# Patient Record
Sex: Female | Born: 2000
Health system: Southern US, Community
[De-identification: ages and names within clinical notes are randomized; demographics above are authoritative.]

## PROBLEM LIST (undated history)

## (undated) ENCOUNTER — Emergency Department (HOSPITAL_BASED_OUTPATIENT_CLINIC_OR_DEPARTMENT_OTHER): Admission: EM | Payer: BC Managed Care – PPO | Source: Home / Self Care

## (undated) ENCOUNTER — Encounter

## (undated) ENCOUNTER — Encounter: Attending: Pediatric Hematology-Oncology | Primary: Pediatric Hematology-Oncology

## (undated) ENCOUNTER — Ambulatory Visit

## (undated) ENCOUNTER — Telehealth

## (undated) ENCOUNTER — Encounter: Attending: Registered" | Primary: Registered"

## (undated) ENCOUNTER — Ambulatory Visit: Payer: PRIVATE HEALTH INSURANCE

## (undated) ENCOUNTER — Encounter
Attending: Student in an Organized Health Care Education/Training Program | Primary: Student in an Organized Health Care Education/Training Program

## (undated) ENCOUNTER — Ambulatory Visit: Payer: PRIVATE HEALTH INSURANCE | Attending: Registered" | Primary: Registered"

## (undated) ENCOUNTER — Ambulatory Visit
Payer: PRIVATE HEALTH INSURANCE | Attending: Student in an Organized Health Care Education/Training Program | Primary: Student in an Organized Health Care Education/Training Program

## (undated) ENCOUNTER — Ambulatory Visit: Payer: BLUE CROSS/BLUE SHIELD

## (undated) ENCOUNTER — Telehealth: Attending: Pediatric Hematology-Oncology | Primary: Pediatric Hematology-Oncology

## (undated) ENCOUNTER — Encounter: Payer: PRIVATE HEALTH INSURANCE | Attending: Gastroenterology | Primary: Gastroenterology

## (undated) ENCOUNTER — Telehealth: Attending: Nephrology | Primary: Nephrology

## (undated) ENCOUNTER — Encounter: Attending: Gastroenterology | Primary: Gastroenterology

## (undated) ENCOUNTER — Encounter: Attending: Surgery | Primary: Surgery

## (undated) ENCOUNTER — Encounter: Attending: Clinical | Primary: Clinical

## (undated) ENCOUNTER — Encounter: Payer: PRIVATE HEALTH INSURANCE | Attending: Pediatric Hematology-Oncology | Primary: Pediatric Hematology-Oncology

## (undated) ENCOUNTER — Other Ambulatory Visit

## (undated) ENCOUNTER — Telehealth: Attending: Clinical | Primary: Clinical

## (undated) ENCOUNTER — Ambulatory Visit: Payer: PRIVATE HEALTH INSURANCE | Attending: Clinical | Primary: Clinical

## (undated) ENCOUNTER — Telehealth: Attending: Hematology & Oncology | Primary: Hematology & Oncology

## (undated) ENCOUNTER — Telehealth: Attending: Pediatrics | Primary: Pediatrics

## (undated) ENCOUNTER — Encounter: Attending: Diagnostic Radiology | Primary: Diagnostic Radiology

## (undated) ENCOUNTER — Ambulatory Visit
Payer: BLUE CROSS/BLUE SHIELD | Attending: Student in an Organized Health Care Education/Training Program | Primary: Student in an Organized Health Care Education/Training Program

## (undated) ENCOUNTER — Ambulatory Visit: Payer: BLUE CROSS/BLUE SHIELD | Attending: Clinical | Primary: Clinical

## (undated) ENCOUNTER — Telehealth: Attending: Family | Primary: Family

## (undated) ENCOUNTER — Encounter: Attending: "Endocrinology | Primary: "Endocrinology

## (undated) ENCOUNTER — Inpatient Hospital Stay

## (undated) ENCOUNTER — Ambulatory Visit: Payer: BLUE CROSS/BLUE SHIELD | Attending: Gastroenterology | Primary: Gastroenterology

## (undated) ENCOUNTER — Ambulatory Visit: Payer: PRIVATE HEALTH INSURANCE | Attending: Gastroenterology | Primary: Gastroenterology

## (undated) ENCOUNTER — Encounter: Attending: Family | Primary: Family

## (undated) ENCOUNTER — Ambulatory Visit: Payer: PRIVATE HEALTH INSURANCE | Attending: Surgery | Primary: Surgery

## (undated) ENCOUNTER — Encounter
Payer: PRIVATE HEALTH INSURANCE | Attending: Plastic and Reconstructive Surgery | Primary: Plastic and Reconstructive Surgery

## (undated) ENCOUNTER — Encounter: Attending: Anesthesiology | Primary: Anesthesiology

## (undated) ENCOUNTER — Non-Acute Institutional Stay: Payer: PRIVATE HEALTH INSURANCE | Attending: Pediatric Hematology-Oncology | Primary: Pediatric Hematology-Oncology

## (undated) ENCOUNTER — Inpatient Hospital Stay: Payer: Medicaid (Managed Care)

## (undated) ENCOUNTER — Encounter: Attending: Internal Medicine | Primary: Internal Medicine

## (undated) ENCOUNTER — Encounter: Payer: PRIVATE HEALTH INSURANCE | Attending: Surgery | Primary: Surgery

## (undated) ENCOUNTER — Ambulatory Visit: Payer: BLUE CROSS/BLUE SHIELD | Attending: Registered" | Primary: Registered"

## (undated) ENCOUNTER — Telehealth
Attending: Student in an Organized Health Care Education/Training Program | Primary: Student in an Organized Health Care Education/Training Program

## (undated) ENCOUNTER — Ambulatory Visit
Attending: Student in an Organized Health Care Education/Training Program | Primary: Student in an Organized Health Care Education/Training Program

## (undated) ENCOUNTER — Encounter: Attending: Plastic and Reconstructive Surgery | Primary: Plastic and Reconstructive Surgery

## (undated) ENCOUNTER — Encounter: Payer: PRIVATE HEALTH INSURANCE | Attending: Hematology & Oncology | Primary: Hematology & Oncology

## (undated) ENCOUNTER — Encounter: Attending: MS" | Primary: MS"

## (undated) ENCOUNTER — Telehealth: Attending: "Endocrinology | Primary: "Endocrinology

## (undated) ENCOUNTER — Ambulatory Visit: Payer: BLUE CROSS/BLUE SHIELD | Attending: Hematology | Primary: Hematology

## (undated) ENCOUNTER — Inpatient Hospital Stay: Payer: BLUE CROSS/BLUE SHIELD

## (undated) ENCOUNTER — Ambulatory Visit
Payer: PRIVATE HEALTH INSURANCE | Attending: Rehabilitative and Restorative Service Providers" | Primary: Rehabilitative and Restorative Service Providers"

## (undated) ENCOUNTER — Telehealth: Attending: Surgery | Primary: Surgery

## (undated) ENCOUNTER — Telehealth: Attending: Gastroenterology | Primary: Gastroenterology

## (undated) ENCOUNTER — Telehealth: Attending: Psychiatry | Primary: Psychiatry

## (undated) ENCOUNTER — Ambulatory Visit: Payer: PRIVATE HEALTH INSURANCE | Attending: Hematology & Oncology | Primary: Hematology & Oncology

## (undated) ENCOUNTER — Ambulatory Visit: Payer: BLUE CROSS/BLUE SHIELD | Attending: Acute Care | Primary: Acute Care

## (undated) ENCOUNTER — Ambulatory Visit: Payer: PRIVATE HEALTH INSURANCE | Attending: Oncology | Primary: Oncology

## (undated) ENCOUNTER — Telehealth: Attending: Family Medicine | Primary: Family Medicine

## (undated) ENCOUNTER — Ambulatory Visit: Payer: BLUE CROSS/BLUE SHIELD | Attending: Psychiatric/Mental Health | Primary: Psychiatric/Mental Health

## (undated) ENCOUNTER — Ambulatory Visit: Payer: PRIVATE HEALTH INSURANCE | Attending: Pediatric Hematology-Oncology | Primary: Pediatric Hematology-Oncology

## (undated) ENCOUNTER — Encounter: Attending: Psychiatric/Mental Health | Primary: Psychiatric/Mental Health

## (undated) DIAGNOSIS — D649 Anemia, unspecified: Secondary | ICD-10-CM

## (undated) DIAGNOSIS — D48119 Desmoid tumor of unspecified site: Secondary | ICD-10-CM

## (undated) DIAGNOSIS — D481 Neoplasm of uncertain behavior of connective and other soft tissue: Secondary | ICD-10-CM

## (undated) DIAGNOSIS — Z9889 Other specified postprocedural states: Secondary | ICD-10-CM

## (undated) DIAGNOSIS — K567 Ileus, unspecified: Secondary | ICD-10-CM

## (undated) DIAGNOSIS — K219 Gastro-esophageal reflux disease without esophagitis: Secondary | ICD-10-CM

## (undated) DIAGNOSIS — K635 Polyp of colon: Secondary | ICD-10-CM

## (undated) DIAGNOSIS — Q8789 Other specified congenital malformation syndromes, not elsewhere classified: Secondary | ICD-10-CM

## (undated) HISTORY — DX: Polyp of colon: K63.5

## (undated) HISTORY — DX: Other specified postprocedural states: Z98.890

## (undated) HISTORY — DX: Gastro-esophageal reflux disease without esophagitis: K21.9

## (undated) HISTORY — PX: COLOPROCTECTOMY W/ ILEO J POUCH: SUR277

## (undated) HISTORY — DX: Other specified congenital malformation syndromes, not elsewhere classified: Q87.89

## (undated) HISTORY — PX: OTHER SURGICAL HISTORY: SHX169

## (undated) HISTORY — PX: APPENDECTOMY: SHX54

## (undated) MED ORDER — MULTIVITAMIN ORAL: Freq: Every day | SUBCUTANEOUS | 0 days

## (undated) MED ORDER — SULINDAC 150 MG TABLET: Freq: Two times a day (BID) | ORAL | 0.00000 days

## (undated) MED ORDER — VITAMIN D2 ORAL: ORAL | 0 days

## (undated) MED ORDER — SIMETHICONE 125 MG CHEWABLE TABLET: Freq: Four times a day (QID) | ORAL | 0 days | PRN

## (undated) MED ORDER — FERROUS SULFATE 325 MG (65 MG IRON) TABLET: Freq: Every day | ORAL | 0 days | Status: SS

## (undated) MED ORDER — MULTIVITAMIN CAPSULE: Freq: Every day | ORAL | 0.00000 days

## (undated) MED ORDER — SULINDAC 200 MG TABLET
Freq: Two times a day (BID) | ORAL | 0 days
Start: ? — End: 2020-02-21

## (undated) MED ORDER — LOPERAMIDE 2 MG TABLET: Freq: Three times a day (TID) | ORAL | 0.00000 days | PRN

---
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
## 2019-01-03 NOTE — Unmapped (Signed)
Hi,    I haven't see eyelash loss before, but have seen overall hair loss. Seems like it can spontaneously improve. Sometimes I start people on a topical calcineurin inhibitor like protopic to see if it helps.    What are the other skin issues she is having? I've cared for a some with the hand foot skin reaction. Combination of keratolytics and topical steroids.     Happy to see if needed.    Thanks,  [employer]

## 2019-01-05 DIAGNOSIS — T50905A Adverse effect of unspecified drugs, medicaments and biological substances, initial encounter: Principal | ICD-10-CM

## 2019-01-05 DIAGNOSIS — L658 Other specified nonscarring hair loss: Principal | ICD-10-CM

## 2019-01-05 DIAGNOSIS — Q8789 Other specified congenital malformation syndromes, not elsewhere classified: Principal | ICD-10-CM

## 2019-01-07 DIAGNOSIS — D126 Benign neoplasm of colon, unspecified: Secondary | ICD-10-CM | POA: Diagnosis not present

## 2019-01-10 NOTE — Unmapped (Signed)
Some blood in urine wo complete UA. ?menses, uti

## 2019-01-11 DIAGNOSIS — D481 Neoplasm of uncertain behavior of connective and other soft tissue: Principal | ICD-10-CM

## 2019-01-11 DIAGNOSIS — Q8789 Other specified congenital malformation syndromes, not elsewhere classified: Principal | ICD-10-CM

## 2019-01-21 NOTE — Unmapped (Signed)
Called for covid screening but pt present in dermatology clinic today, already screened

## 2019-01-21 NOTE — Unmapped (Signed)
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 Selena Batten of [facility] 08/2018 and steroid injections to arm last month), epidermal cysts, and GI polyps,??last seen by [name] 10/2018 and??here for recheck post starting sorafenib. This was started 12/2018 200mg  a day with clear shrinkage of one visible lesion  but K developed nausea, dizzyness, dehydration likely due to med, seen in local ER (she is in college in [place] so that we were timing the start of med so that I could see her back ~ a month after starting when she came home for break). Doing better after iv fluids x2 though not yet back on med. Stikll a bit dizzy and fained end of last wk.. Also has had considerable hair and eyebrow loss which has been described though rarely so and saw derm today who felt that hair loss more common than our lit suggests, and gave a number of creams. Of note, had been nauseated on sulindac. See my last note for more detailed summary.    No longer on sulindac . nl US thyroid and f/u endo per recs. She was seen ??by Dr.Sandler adult GI 02/2018??and scheduled for colonoscopy again 02/25/2019.????She has had multiple mass excisions most recently by Dr. Lucretia Roers of plastic??surgery??02/2017 with inclusion cysts and osteomas,no malignancy;??jaw pits, which are being followed by dental, several removed over past yr, some delayed dentition--no canines. Wearing ??invisilon no other hardward . Has had thyroid nodules with low tsh, low free T4 last tested a year ago being follwed by endo, Dr. Timmothy Euler??though with [facility] recs of annual exam no [name]. Last scans 10/2018.     Feels that right tibial lesion softer and smaller while on sorafenib as were back neck and ant chest leisons now a bit bigger to her self exam with pain in back over old scar. PMH+ multiple new desmoids; numbness of both arms intermittently; recent right arm sprain  with fall and in brace for another 2 wks. Light but constant periods for 3 wks. No gi bleeding any longer but anorectic, ongoing nausea. Has boyfriend. Denies sex. Going back to college in [place] end dec.  ??  Other issues ankles, knees hurt??w??good response of wrist to intralesional steroids no longer an issue/  ??  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 Very stressed with abdominal pain  ??  REVIEW OF SYSTEMS: Times 12 is otherwise unchanged.   ??  FAMILY HISTORY AND SOCIAL HISTORY: Otherwise unchanged.No smoking drinking sexual activity drugs??though has boyfriend. Discussed with mom in room. Both parents negative for Gardners mutation.  ??  PHYSICAL EXAMINATION:??last Wt 49.2 kg (108 lb 8 oz)  which already was dec BP 116/72  - Pulse 89  - Temp 36.5 ??C (97.7 ??F) (Oral)  - Resp 20  - Ht 166.3 cm (5' 5.47)  - Wt 49.8 kg (109 lb 11.2 oz)  - SpO2 100%  - BMI 17.99 kg/m??   Looks well think but not bad    GENERAL:??hair looks normal and the pics family broght with them on cell phone show small clumps of hair.   SKIN: Shows some nevi, one on the right lower back no change from last visit dry skin on extremities improved off sorafenib   HEENT: ??no tenderness of jaw.several small leions beneath tongue which may be Garnder related cysts??NECK: Supple.  Thyroid??nl exam  CHEST: Clear. I cant feel right upper ant chest lesion that K says she feels, though this was  present on past scans BREASTS:??deferred--CARDIAC: clear regular rr no murmurs  ??I cant feel????prominence left lateral??clavicle??at this time????BACK: Notable for her old surgical scars. Again, I dont feel mass where K says she feels one under scar  well healed ABdomen--nontender no HS  GENITOURINARY: Tanner5 female.  EXTREMITIES 1 cm firm nontender lesion r tib????NEUROLOGIC:color sensation nl in lue--I left brace on  Nonfocal. Mostly right handed??Karnofsky??90%. MEDICAL DECISION MAKING: recent neg preg test cbc chems cont zofran as needed. Will restart 1/2 tab (100mg ) sorafenib despite not labeled for such and will see how things go. For monthly selfies of leg lesion to help monitor. I would like to hold on repeat scans until on sorafenib for 73m. Family to call for refill. Needs f/u with dr Cheron Schaumann in addn to colonoscopy  ??  ASSESSMENT:  1. Familial adenomatous polyposis. On sorafenib for the desmoids as above and can consider restart at 100mg  a day. This is not an available dose and will consider how to break tab, also not generally done. I am concerned that with past h/o nausea, this isnt all sorafenib.    Needs GI f/u. Genetics counseling if pregnancy contemplated down the line.seeing dr calikoglu of endo (may need adult transition).??dental monitoring is ongoing.I would consider??foundation one??though may not find anything.  ????  2. Colonic and gastric polyps secondary to fap and I dont know if these will respond in parallel with desmoids    3. Hair loss-seen by dr Kateri Mc of derm today??  4. Abn thyroid tests--f/u with endo??--  5. Dental issues????See above  neds f/u visit. Finances and coding seeem to be an issue but mom needs to address this  4. Irregular periods.??nl coags in past. May be wt related orbut getting a bit better. May need ocp  5. Well care--for now per routine. Depending on rx for desmoids no live vaccinations. utd with flu shot  To financial counseling    T=1hr coord care  Labs ok to start meds with  Results for orders placed or performed during the hospital encounter of 01/24/19   AFP tumor marker   Result Value Ref Range    AFP-Tumor Marker 3 <8 ng/mL   Comprehensive Metabolic Panel   Result Value Ref Range    Sodium 141 135 - 145 mmol/L    Potassium 4.1 3.5 - 5.0 mmol/L    Chloride 107 98 - 107 mmol/L    Anion Gap 6 (L) 7 - 15 mmol/L    CO2 28.0 22.0 - 30.0 mmol/L    BUN 10 7 - 21 mg/dL    Creatinine 0.98 1.19 - 1.00 mg/dL    BUN/Creatinine Ratio 16 EGFR CKD-EPI Non-African American, Female >90 >=60 mL/min/1.16m2    EGFR CKD-EPI African American, Female >90 >=60 mL/min/1.50m2    Glucose 89 70 - 179 mg/dL    Calcium 9.4 8.5 - 14.7 mg/dL    Albumin 4.3 3.5 - 5.0 g/dL    Total Protein 7.3 6.5 - 8.3 g/dL    Total Bilirubin 0.5 0.0 - 1.2 mg/dL    AST 23 14 - 38 U/L    ALT 10 <35 U/L    Alkaline Phosphatase 58 50 - 130 U/L   CBC w/ Differential   Result Value Ref Range    WBC 4.0 (L) 4.5 - 11.0 10*9/L    RBC 5.41 (H) 4.00 - 5.20 10*12/L    HGB 15.2 12.0 - 16.0 g/dL    HCT 82.9 (H) 56.2 - 46.0 %    MCV 85.8  80.0 - 100.0 fL    MCH 28.1 26.0 - 34.0 pg    MCHC 32.8 31.0 - 37.0 g/dL    RDW 16.1 09.6 - 04.5 %    MPV 10.6 (H) 7.0 - 10.0 fL    Platelet 175 150 - 440 10*9/L    Neutrophils % 45.2 %    Lymphocytes % 39.9 %    Monocytes % 9.0 %    Eosinophils % 2.4 %    Basophils % 0.7 %    Neutrophil Left Shift 1+ (A) Not Present    Absolute Neutrophils 1.8 (L) 2.0 - 7.5 10*9/L    Absolute Lymphocytes 1.6 1.5 - 5.0 10*9/L    Absolute Monocytes 0.4 0.2 - 0.8 10*9/L    Absolute Eosinophils 0.1 0.0 - 0.4 10*9/L    Absolute Basophils 0.0 0.0 - 0.1 10*9/L    Large Unstained Cells 3 0 - 4 %

## 2019-01-24 ENCOUNTER — Encounter
Admit: 2019-01-24 | Discharge: 2019-01-24 | Payer: PRIVATE HEALTH INSURANCE | Attending: Pediatric Hematology-Oncology | Primary: Pediatric Hematology-Oncology

## 2019-01-24 ENCOUNTER — Encounter: Admit: 2019-01-24 | Discharge: 2019-01-24 | Payer: PRIVATE HEALTH INSURANCE

## 2019-01-24 DIAGNOSIS — L658 Other specified nonscarring hair loss: Principal | ICD-10-CM

## 2019-01-24 DIAGNOSIS — Q8789 Other specified congenital malformation syndromes, not elsewhere classified: Principal | ICD-10-CM

## 2019-01-24 DIAGNOSIS — L309 Dermatitis, unspecified: Principal | ICD-10-CM

## 2019-01-24 DIAGNOSIS — T50905A Adverse effect of unspecified drugs, medicaments and biological substances, initial encounter: Secondary | ICD-10-CM

## 2019-01-24 DIAGNOSIS — D485 Neoplasm of uncertain behavior of skin: Secondary | ICD-10-CM | POA: Diagnosis not present

## 2019-01-24 DIAGNOSIS — R946 Abnormal results of thyroid function studies: Secondary | ICD-10-CM | POA: Diagnosis not present

## 2019-01-24 DIAGNOSIS — D126 Benign neoplasm of colon, unspecified: Secondary | ICD-10-CM | POA: Diagnosis not present

## 2019-01-24 DIAGNOSIS — D481 Neoplasm of uncertain behavior of connective and other soft tissue: Secondary | ICD-10-CM | POA: Diagnosis not present

## 2019-01-24 DIAGNOSIS — Z79899 Other long term (current) drug therapy: Secondary | ICD-10-CM | POA: Diagnosis not present

## 2019-01-24 DIAGNOSIS — K635 Polyp of colon: Secondary | ICD-10-CM | POA: Diagnosis not present

## 2019-01-24 DIAGNOSIS — N926 Irregular menstruation, unspecified: Secondary | ICD-10-CM | POA: Diagnosis not present

## 2019-01-24 DIAGNOSIS — E079 Disorder of thyroid, unspecified: Secondary | ICD-10-CM | POA: Diagnosis not present

## 2019-01-24 LAB — COMPREHENSIVE METABOLIC PANEL
ALBUMIN: 4.3 g/dL (ref 3.5–5.0)
ALKALINE PHOSPHATASE: 58 U/L (ref 50–130)
ALT (SGPT): 10 U/L (ref <35)
ANION GAP: 6 mmol/L — ABNORMAL LOW (ref 7–15)
AST (SGOT): 23 U/L (ref 14–38)
BILIRUBIN TOTAL: 0.5 mg/dL (ref 0.0–1.2)
BLOOD UREA NITROGEN: 10 mg/dL (ref 7–21)
BUN / CREAT RATIO: 16
CALCIUM: 9.4 mg/dL (ref 8.5–10.2)
CO2: 28 mmol/L (ref 22.0–30.0)
CREATININE: 0.61 mg/dL (ref 0.60–1.00)
EGFR CKD-EPI AA FEMALE: >90 mL/min/{1.73_m2} (ref >=60)
EGFR CKD-EPI NON-AA FEMALE: >90 mL/min/{1.73_m2} (ref >=60)
GLUCOSE RANDOM: 89 mg/dL (ref 70–179)
POTASSIUM: 4.1 mmol/L (ref 3.5–5.0)
PROTEIN TOTAL: 7.3 g/dL (ref 6.5–8.3)
SODIUM: 141 mmol/L (ref 135–145)

## 2019-01-24 LAB — CBC W/ AUTO DIFF
BASOPHILS ABSOLUTE COUNT: 0 10*9/L (ref 0.0–0.1)
BASOPHILS RELATIVE PERCENT: 0.7 %
EOSINOPHILS ABSOLUTE COUNT: 0.1 10*9/L (ref 0.0–0.4)
EOSINOPHILS RELATIVE PERCENT: 2.4 %
HEMATOCRIT: 46.4 % — ABNORMAL HIGH (ref 36.0–46.0)
HEMOGLOBIN: 15.2 g/dL (ref 12.0–16.0)
LARGE UNSTAINED CELLS: 3 % (ref 0–4)
LYMPHOCYTES ABSOLUTE COUNT: 1.6 10*9/L (ref 1.5–5.0)
LYMPHOCYTES RELATIVE PERCENT: 39.9 %
MEAN CORPUSCULAR HEMOGLOBIN CONC: 32.8 g/dL (ref 31.0–37.0)
MEAN CORPUSCULAR HEMOGLOBIN: 28.1 pg (ref 26.0–34.0)
MEAN CORPUSCULAR VOLUME: 85.8 fL (ref 80.0–100.0)
MEAN PLATELET VOLUME: 10.6 fL — ABNORMAL HIGH (ref 7.0–10.0)
MONOCYTES ABSOLUTE COUNT: 0.4 10*9/L (ref 0.2–0.8)
NEUTROPHILS RELATIVE PERCENT: 45.2 %
PLATELET COUNT: 175 10*9/L (ref 150–440)
RED BLOOD CELL COUNT: 5.41 10*12/L — ABNORMAL HIGH (ref 4.00–5.20)
RED CELL DISTRIBUTION WIDTH: 12.9 % (ref 12.0–15.0)
WBC ADJUSTED: 4 10*9/L — ABNORMAL LOW (ref 4.5–11.0)

## 2019-01-24 LAB — EOSINOPHILS RELATIVE PERCENT: Eosinophils/100 leukocytes:NFr:Pt:Bld:Qn:Automated count: 2.4

## 2019-01-24 LAB — AFP-TUMOR MARKER: Alpha-1-Fetoprotein.tumor marker:MCnc:Pt:Ser/Plas:Qn:: 3

## 2019-01-24 LAB — ALKALINE PHOSPHATASE: Alkaline phosphatase:CCnc:Pt:Ser/Plas:Qn:: 58

## 2019-01-24 MED ORDER — TACROLIMUS 0.03 % TOPICAL OINTMENT
Freq: Two times a day (BID) | TOPICAL | 5 refills | 0.00000 days | Status: CP
Start: 2019-01-24 — End: 2019-01-24

## 2019-01-24 MED ORDER — TRIAMCINOLONE ACETONIDE 0.1 % TOPICAL OINTMENT
Freq: Two times a day (BID) | TOPICAL | 4 refills | 0.00000 days | Status: CP
Start: 2019-01-24 — End: 2020-01-24

## 2019-01-24 MED ORDER — CLOBETASOL 0.05 % SCALP SOLUTION
Freq: Two times a day (BID) | TOPICAL | 5 refills | 0.00000 days | Status: CP
Start: 2019-01-24 — End: 2020-01-24

## 2019-01-24 MED ORDER — TACROLIMUS 0.1 % TOPICAL OINTMENT
Freq: Two times a day (BID) | TOPICAL | 11 refills | 30.00000 days | Status: CP
Start: 2019-01-24 — End: 2020-01-24

## 2019-01-24 MED ORDER — CLOBETASOL 0.05 % TOPICAL OINTMENT
Freq: Two times a day (BID) | TOPICAL | 3 refills | 0.00000 days | Status: CP
Start: 2019-01-24 — End: 2020-01-24

## 2019-01-24 NOTE — Unmapped (Addendum)
We appreciated the opportunity to see you in clinic today! Your resident doctor was Louanne Skye, MD and your attending doctor was Johnney Killian, MD.     - triamcinolone (KENALOG) 0.1 % ointment; Apply topically Two (2) times a day. To dry, scaly areas on the body for 1-2 weeks, then stop. Repeat as needed.  - tacrolimus (PROTOPIC) 0.03 % ointment; Apply topically Two (2) times a day. To itchy, red areas on the face as needed.  - clobetasoL (TEMOVATE) 0.05 % external solution; Apply topically Two (2) times a day. To the scalp as needed for itching, redness.  - clobetasoL (TEMOVATE) 0.05 % ointment; Apply topically Two (2) times a day. To the hands and feet when itching, scaling. Use for 1-2 weeks, then stop. Repeat as needed.    Basic Skin Care   Below are some tips on how to try and maximize skin health from the outside in.    1) Bathe in mildly warm water every 1 to 3 days, followed by light drying and an application of a thick moisturizer cream or ointment, preferably one that comes in a tub.  a. Fragrance free moisturizing bars or body washes are preferred such as Purpose, Cetaphil, Dove sensitive skin, Aveeno, [employer] or Vanicream products.  b. Use a fragrance free cream or ointment, not a lotion, such as plain petroleum jelly or Vaseline ointment, Aquaphor, Vanicream, CeraVe Cream, Cetaphil Restoraderm, Aveeno Eczema Therapy and [employer], among others.  c. Children with very dry skin often need to put on these creams two, three or four times a day.  As much as possible, use these creams enough to keep the skin from looking dry.  d. Consider using fragrance free/dye free detergent, such as Arm and Hammer for sensitive skin, Tide Free or All Free.     2) If I am prescribing a medication to go on the skin, the medicine goes on first to the areas that need it, followed by a thick cream as above to the entire body.    3) Wynelle Link is a major cause of damage to the skin. a. I recommend sun protection for all of my patients. I prefer physical barriers such as hats with wide brims that cover the ears, long sleeve clothing with SPF protection including rash guards for swimming. These can be found seasonally at outdoor clothing companies, [employer] and [employer] and online at Liz Claiborne.com, www.uvskinz.com and BrideEmporium.nl. Avoid peak sun between the hours of 10am to 3pm to minimize sun exposure.    b. I recommend sunscreen for all of my patients older than 7 months of age when in the sun, preferably with broad spectrum coverage and SPF 30 or higher.   i. For children, I recommend sunscreens that only contain titanium dioxide and/or zinc oxide in the active ingredients. These do not burn the eyes and appear to be safer than chemical sunscreens. These sunscreens include zinc oxide paste found in the diaper section, Vanicream Broad Spectrum 50+, Aveeno Natural Mineral Protection, Neutrogena Pure and Free Baby, Johnson [employer] Daily face and body lotion, [employer], among others.  ii. There is no such thing as waterproof sunscreen. All sunscreens should be reapplied after 60-80 minutes of wear.   iii. Spray on sunscreens often use chemical sunscreens which do protect against the sun. However, these can be difficult to apply correctly, especially if wind is present, and can be more likely to irritate the skin.  Long term effects of chemical  sunscreens are also not fully known.    c. I also recommend discussing Vitamin D supplementation with your pediatrician as children typically do not get enough Vitamin D through the skin in our area, even without using sunscreen.        If any of your medications are too expensive, you can look for a coupon at GoodRx.com  - Enter the medication name, size, and your zip code to find coupons for local pharmacies.   - You can print a coupon and bring it to the pharmacy, or pull up the coupon on a smartphone. - You can also call pharmacies ahead of time to ask about the cost of your medication before you pick it up.    Please also feel free to call the clinic at [phone] with any concerns. We look forward to seeing you again!

## 2019-01-24 NOTE — Unmapped (Signed)
[facility] Clinical Assessment & Refill Coordination Note    [name] [name], DOB: 04/01/2000  Phone: [phone] (home)     All above HIPAA information was verified with mom     Specialty Medication(s):   Hematology/Oncology: Nexavar     Current Outpatient Medications   Medication Sig Dispense Refill   ??? clobetasoL (TEMOVATE) 0.05 % external solution Apply topically Two (2) times a day. To the scalp as needed for itching, redness. 50 mL 5   ??? clobetasoL (TEMOVATE) 0.05 % ointment Apply topically Two (2) times a day. To the hands and feet when itching, scaling. Use for 1-2 weeks, then stop. Repeat as needed. 60 g 3   ??? famotidine (PEPCID) 40 MG tablet Take 40 mg by mouth daily.     ??? ondansetron (ZOFRAN) 8 MG tablet Take 1 tablet (8 mg total) by mouth every twelve (12) hours as needed for nausea. 60 tablet 2   ??? SORAfenib (NEXAVAR) 200 mg tablet Take 1 tablet (200 mg total) by mouth daily. 30 tablet 2   ??? tacrolimus (PROTOPIC) 0.03 % ointment Apply topically Two (2) times a day. To itchy, red areas on the face as needed. 100 g 5   ??? triamcinolone (KENALOG) 0.1 % ointment Apply topically Two (2) times a day. To dry, scaly areas on the body for 1-2 weeks, then stop. Repeat as needed. 453 g 4     No current facility-administered medications for this visit.         Changes to medications: [name] [name] reports no changes at this time.    Allergies   Allergen Reactions   ??? Neomycin Swelling     Rxn after ear drops; ear swelling       Changes to allergies: No    SPECIALTY MEDICATION ADHERENCE     Nexavar 200 mg: 4 days of medicine on hand     Medication Adherence    Patient reported X missed doses in the last month: 0  Specialty Medication: Nexavar 200mg           Specialty medication(s) dose(s) confirmed: restarting with 1/2 tablet (100mg ) daily     Are there any concerns with adherence? No    Adherence counseling provided? Not needed    CLINICAL MANAGEMENT AND INTERVENTION Clinical Benefit Assessment:    Do you feel the medicine is effective or helping your condition? Yes    Clinical Benefit counseling provided? Not needed    Adverse Effects Assessment:    Are you experiencing any side effects? has had alot of hair loss, skin problems which they have discussed with Dr Ciro Backer    Are you experiencing difficulty administering your medicine? No    Quality of Life Assessment:    How many days over the past month did your condition/medication  keep you from your normal activities? For example, brushing your teeth or getting up in the morning. 0    Have you discussed this with your provider? Not needed    Therapy Appropriateness:    Is therapy appropriate? Yes, therapy is appropriate and should be continued    DISEASE/MEDICATION-SPECIFIC INFORMATION      N/A    PATIENT SPECIFIC NEEDS     ? Does the patient have any physical, cognitive, or cultural barriers? No    ? Is the patient high risk? Yes, pediatric patient     ? Does the patient require a Care Management Plan? No     ? Does the patient require  physician intervention or other additional services (i.e. nutrition, smoking cessation, social work)? No      SHIPPING     Specialty Medication(s) to be Shipped:   Hematology/Oncology: Nexavar    Other medication(s) to be shipped: n/a     Changes to insurance: No    Delivery Scheduled: Yes, Expected medication delivery date: 01/26/19.     Medication will be delivered via UPS to the confirmed prescription address in [facility].    The patient will receive a drug information handout for each medication shipped and additional FDA Medication Guides as required.  Verified that patient has previously received a Conservation officer, historic buildings.    All of the patient's questions and concerns have been addressed.    Rollen Sox   [facility] Specialty Pharmacist

## 2019-01-24 NOTE — Unmapped (Signed)
Dermatology Patient Visit    ASSESSMENT/PLAN:  [name] [name] was seen today for hair/scalp, rash problem.    Diagnoses and all orders for this visit:    Eczema/xerosis possibly 2/2 sorafenib use (w/ underlying KP)  Improving palmar-plantar erythrodysesthesia (HFSR) with mild erythema of palm  - no previous history of eczema. This could possibly be due to sorafenib use as all symptoms began after starting medication. More common side effects of sorafenib include (>10%): xerosis, alopecia, pruritus, palmoplantar erythrodysesthesia, rashes.    Plan:  - triamcinolone (KENALOG) 0.1 % ointment; Apply topically Two (2) times a day. To dry, scaly areas on the body for 1-2 weeks, then stop. Repeat as needed.  - tacrolimus (PROTOPIC) 0.03 % ointment; Apply topically Two (2) times a day. To itchy, red areas on the face as needed.  - clobetasoL (TEMOVATE) 0.05 % external solution; Apply topically Two (2) times a day. To the scalp as needed for itching, redness.  - clobetasoL (TEMOVATE) 0.05 % ointment; Apply topically Two (2) times a day. To the hands and feet when itching, scaling. Use for 1-2 weeks, then stop. Repeat as needed.    Drug-related hair loss- stable  DDx: likely telogen effluvium vs anagen effluvium 2/2 sorafenib (Alopecia quite common, reported 14-67%). No concerns for scarring/non-scarring alopecia.   - Can consider minoxidil 5% treatment in the future, but stable today.  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  - Managed by Hem/Onc (Dr. Ciro Backer), GI (plan for colonoscopy Dec 2020), and Vascular (Dr. Selena Batten). - Hx of multiple desmoid tumors??(post cryotherapy by Dr. Selena Batten of [facility] 08/2018 and steroid injections to R forearm), epidermal cysts, and GI polyps. She has had multiple mass excisions most recently by Dr. Lucretia Roers of plastic??surgery??02/2017 with inclusion cysts and osteomas, no malignancy;??jaw pits, which are being followed by dental, several removed over past yr.     Education was provided by discussing the etiology, natural history, course and treatment for the above conditions.  Reassurance and anticipatory guidance were provided  The family has my contact information and knows to contact me for any questions or concerns or changes in the patient's condition.    FOLLOWUP:  Return in about 4 weeks (around 02/21/2019) for f/u (12/15 in [place]).   Planning to restart sorafenib per family, they can send photos as rashes recur    Referring physician: Debbe Mounts, MD  [address]  [id number] Physician Office Building  [place],  [place] [address]    Chief complaint:  Chief Complaint   Patient presents with   ??? 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 Hair loss, scaling rash present on scalp, eyebrows, anterior thighs and arms. Onset of hair loss, rash 3 weeks after starting sorafenib in oct 2020. Prior treatments include eucerin, nothing for scalp. Today, has been off of sorafenib for 2 weeks with stabilization of hair loss, but still present. No patches of hair  loss or scarring of scalp. Still having itchy, scaly rash at eyebrows and scalp, generalized dry skin and occasionally itchy, red rash on arms/legs. Pt reports ccasional tenderness, redness at fingertips since starting sorafenib. Sorafenib was stopped due to dehydration, had passed out at school. Unclear plan for restart right now, but have follow-up with hem/onc today.     GI: Plan for endoscopy/colonoscopy within the next month. Hx of polyps on sulindac.     Denies history of eczema.     Current medications:  Current Outpatient Medications   Medication Sig Dispense Refill   ??? clobetasoL (TEMOVATE) 0.05 % external solution Apply topically Two (2) times a day. To the scalp as needed for itching, redness. 50 mL 5   ??? clobetasoL (TEMOVATE) 0.05 % ointment Apply topically Two (2) times a day. To the hands and feet when itching, scaling. Use for 1-2 weeks, then stop. Repeat as needed. 60 g 3   ??? famotidine (PEPCID) 40 MG tablet Take 40 mg by mouth daily.     ??? ondansetron (ZOFRAN) 8 MG tablet Take 1 tablet (8 mg total) by mouth every twelve (12) hours as needed for nausea. 60 tablet 2   ??? SORAfenib (NEXAVAR) 200 mg tablet Take 1 tablet (200 mg total) by mouth daily. 30 tablet 2   ??? tacrolimus (PROTOPIC) 0.03 % ointment Apply topically Two (2) times a day. To itchy, red areas on the face as needed. 100 g 5 ??? triamcinolone (KENALOG) 0.1 % ointment Apply topically Two (2) times a day. To dry, scaly areas on the body for 1-2 weeks, then stop. Repeat as needed. 453 g 4     No current facility-administered medications for this visit.        Allergies:  Allergies   Allergen Reactions   ??? Neomycin Swelling     Rxn after ear drops; ear swelling       Past medical history:  Past Medical History:   Diagnosis Date   ??? [name]    ??? Disease of thyroid gland     multiple sub-5 mm cystic foci most compatible with colloid cysts, per Thyroid US   ??? [name] (familial adenomatous polyposis)    ??? [name]    ??? Intestinal polyps    ??? Vision problems      Active Ambulatory Problems     Diagnosis Date Noted   ??? [name] 08/25/2012   ??? Intestinal polyps 08/25/2012   ??? [name] 08/25/2012   ??? Benign neoplasm of skin of trunk 01/17/2004   ??? Other benign neoplasm of connective and other soft tissue of upper limb, including shoulder 02/26/2011   ??? History of colonic polyps 01/09/2012   ??? [name] of skin 10/30/2016   ??? Thyroid cyst 10/05/2018     Resolved Ambulatory Problems     Diagnosis Date Noted   ??? No Resolved Ambulatory Problems     Past Medical History:   Diagnosis Date   ??? Disease of thyroid gland    ??? [name] (familial adenomatous polyposis)    ??? Vision problems        Family history:  Family History   Problem Relation Age of Onset   ??? No Known Problems Mother    ??? No Known Problems Father    ??? No Known Problems Sister    ??? No Known Problems Brother    ??? No Known Problems Maternal Aunt    ??? No Known Problems Maternal Uncle    ???  No Known Problems Paternal Aunt    ??? No Known Problems Paternal Uncle    ??? Stroke Maternal Grandmother    ??? Other Maternal Grandmother         benign lesions of liver and pancreas, further details unknown   ??? No Known Problems Maternal Grandfather    ??? No Known Problems Paternal Grandmother    ??? No Known Problems Paternal Grandfather    ??? Anesthesia problems Neg Hx ??? Broken bones Neg Hx    ??? Cancer Neg Hx    ??? Clotting disorder Neg Hx    ??? Collagen disease Neg Hx    ??? Diabetes Neg Hx    ??? Dislocations Neg Hx    ??? Fibromyalgia Neg Hx    ??? Gout Neg Hx    ??? Hemophilia Neg Hx    ??? Osteoporosis Neg Hx    ??? Rheumatologic disease Neg Hx    ??? Scoliosis Neg Hx    ??? Severe sprains Neg Hx    ??? Sickle cell anemia Neg Hx    ??? Spinal Compression Fracture Neg Hx    ??? Melanoma Neg Hx    ??? Basal cell carcinoma Neg Hx    ??? Squamous cell carcinoma Neg Hx          Review of systems:  Other than what was mentioned in the history of present illness, the patient's review of systems is negative. Specifically, the patient has not experienced any recent fever, chills, weight change, headache, cough, or sore throat.     Physical exam:    There were no vitals taken for this visit.  Patient is well-appearing, in no acute distress, and well-groomed.  Alert and age appropriate interaction, with pleasant and appropriate mood and affect.  Examination performed by inspection and palpation. comprehensive: Examination of the scalp, hair, face, eyelids/conjunctivae, lips, ears, neck, chest, back, abdomen, right upper extremity, left upper extremity, right lower extremity, left lower extremity, buttocks, digits, and nails is performed.  All areas not specifically commented on are within normal limits.     - hyperkeratotic follicularly based papules on the anterior thighs and Xerosis diffusely.  - atrophic plaques at the R forearm 2/2 steroid injections  - A well-defined, rubbery, nontender subcutaneous nodule located on the R shin.  - Diffuse flaking scale throughout the scalp  - A firm, skin-colored, subcutaneous nodule that is freely movable and has a central punctum located on R shin c/w EIC  - scaling, erythema of the eyebrows. - hair pull shows increased telogen loss with at least 10% of hairs in the telogen phase. New short hairs growing at fronto-temporal hair line. No diffuse thinning of hair, no scarring, no patches of hair loss. No loss of eyelashes. Slight thinning of eyebrows.   - Mild erythema at 1-2 fingertip pads on left hand.

## 2019-01-25 MED FILL — NEXAVAR 200 MG TABLET: 15 days supply | Qty: 15 | Fill #2 | Status: AC

## 2019-01-25 MED FILL — NEXAVAR 200 MG TABLET: ORAL | 15 days supply | Qty: 15 | Fill #2

## 2019-01-31 DIAGNOSIS — R05 Cough: Secondary | ICD-10-CM | POA: Diagnosis not present

## 2019-01-31 DIAGNOSIS — Z03818 Encounter for observation for suspected exposure to other biological agents ruled out: Secondary | ICD-10-CM | POA: Diagnosis not present

## 2019-01-31 DIAGNOSIS — Z20828 Contact with and (suspected) exposure to other viral communicable diseases: Secondary | ICD-10-CM | POA: Diagnosis not present

## 2019-02-02 DIAGNOSIS — D481 Neoplasm of uncertain behavior of connective and other soft tissue: Principal | ICD-10-CM

## 2019-02-07 NOTE — Unmapped (Signed)
Called to say covid from boyfriend. Loss of taste and smell, O2 st 95% mild cough. I suggested for now holding sorafenib and to call me in 1-2 wks. Tolerating the lower dose better and leg lesion seems to be slowly improving. isolatng away from family at home but seeing boyfriend which I told her was arguable.

## 2019-02-08 NOTE — Unmapped (Signed)
02/22/2019    Followup visit for??5????year-old white female with [name] ([name]), multiple desmoids??(post????cryotherapy #3??by Dr. Selena Batten of [facility] 08/2018??and steroid injections to arm last month), epidermal cysts, and GI polyps,??last seen by [name] 01/2019??and??here for recheck. Also seeing Dr Kateri Mc of derm for hair loss--below.  Had had some subtle shrinkage of visible desmoids on sorafenib but due to fatigue dizzyness dehydration nausea and hair loss we cut back to 100mg  a day which is 1/2 a pill (counter to recs, we are splitting tabs). However, K and boyfriend both covid + and some mild respiratory sx so I stopped sorafenib end of November. Resumed after phone call follow up last wk and doing well. lesions and surrounding skin seem to get itchy off sorafenib. Possibly smaller in past on sorafenib though hard to tell after only a wk. Hair changes stable and using topicals per derm. Due colonoscopy end wk.      Boyfriend, not having intercourse, no ocp. No smoking or drugs    No longer on??sulindac .  She has had multiple mass excisions most recently by Dr. Lucretia Roers of plastic??surgery??02/2017 with inclusion cysts and osteomas,no malignancy;??jaw pits, which are being followed by dental, several removed over past yr, some delayed dentition--no canines. Wearing ??invisilon no other hardward . Has had thyroid nodules with low tsh, low free T4 last tested a year ago being follwed by endo, Dr. Timmothy Euler??though with [facility] recs of annual exam no [name]. Last scans 10/2018.   ????  PMH+ multiple new desmoids; numbness of both arms intermittently; recent right arm sprain ??with fall and in brace for another 2 wks. Light but constant periods for 3 wks. Other issues ankles, knees hurt??w??good response of wrist to intralesional steroids no longer an issue/  ??  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  Denies sex, drugs, etoh. Going back to college in [place] end dec. ??  ??  REVIEW OF SYSTEMS: Times 12 is otherwise unchanged. ??  FAMILY HISTORY AND SOCIAL HISTORY: see above  ??  PHYSICAL EXAMINATION:??last Wt  49.8 kg BP 117/77  - Pulse 75  - Temp 36.4 ??C (97.5 ??F) (Oral)  - Resp 18  - Ht 166.4 cm (5' 5.51)  - Wt 50.4 kg (111 lb 1.6 oz)  - SpO2 100%  - BMI 18.20 kg/m??   Looks well   ??mask on and with recent hx I di not do oral exam  GENERAL:??hair looks normal maybe a bit thinner than in past    SKIN: Shows some nevi, one on the right lower back no change from last visit dry skin on extremities improved off sorafenib  ??HEENT: ??deferred ??NECK: Supple.Thyroid??nl exam  CHEST: Clear. I cant feel right upper ant chest lesion that K says she feels, though this was present on past scans some redness of skin left axillary line where itching BREASTS:??deferred--CARDIAC: clear??regular rr no murmurs??????I cant feel????prominence left lateral??clavicle??at this time????BACK: Notable for her old surgical scars. Again, I dont feel mass where K says she feels one under scar  well healed??Abdomen--nontender no HS????GENITOURINARY: deferred  EXTREMITIES 1 cm softer  nontender lesion r tibia and new 1/4cm medial tibial lesion K feels others which I cant be sure of on extrem   ????NEUROLOGIC:color sensation nl in lue--I left brace on  Nonfocal. Mostly right handed??Karnofsky??90%.  ??  MEDICAL DECISION MAKING: recent neg preg test cbc chems??cont zofran as needed.once back on sorafenib 1/2 tab (100mg ) despite not labeled for such will see how things go. For monthly selfies of  leg lesion to help monitor. I would like to hold on repeat scans until on sorafenib for 38m and given school schedule will have to wait until easter.  ekg today ok exc mild bradycardia HR 57  ??  ASSESSMENT: 1. Familial adenomatous polyposis.  sorafenib for the desmoids as above and can consider . No atarax with sorafenib and needs to know about drug drug interactions.Needs GI f/u. Genetics counseling if pregnancy contemplated down the line.seeing dr calikoglu of??[facility] (may need adult transition).??dental monitoring is ongoing.I would consider??foundation one??though may not find anything.  ????  2. Colonic and gastric polyps secondary to fap and I dont know if these will respond in parallel with desmoids  ??  3. Hair loss-seen by dr Kateri Mc of derm today??  4. Abn thyroid tests--f/u with endo??--  5. Dental issues????See above  neds f/u visit. Finances and coding seeem to be an issue but mom needs to address this  5. Irregular periods.??period today nl coags in past. May be wt related but getting a bit better. May need ocp  6. Recent covid    Will plan monthly labs in college and rtc next time home in spring  5. Well care--for now per routine. Depending on rx for desmoids no live vaccinations. utd with flu shot

## 2019-02-09 ENCOUNTER — Other Ambulatory Visit: Payer: Self-pay

## 2019-02-09 DIAGNOSIS — Z20822 Contact with and (suspected) exposure to covid-19: Secondary | ICD-10-CM

## 2019-02-12 LAB — NOVEL CORONAVIRUS, NAA: SARS-CoV-2, NAA: NOT DETECTED

## 2019-02-22 ENCOUNTER — Encounter: Admit: 2019-02-22 | Discharge: 2019-02-23 | Payer: PRIVATE HEALTH INSURANCE

## 2019-02-22 ENCOUNTER — Non-Acute Institutional Stay: Admit: 2019-02-22 | Discharge: 2019-02-23 | Payer: PRIVATE HEALTH INSURANCE

## 2019-02-22 ENCOUNTER — Encounter: Admit: 2019-02-22 | Discharge: 2019-02-23 | Payer: PRIVATE HEALTH INSURANCE | Attending: Family | Primary: Family

## 2019-02-22 ENCOUNTER — Encounter
Admit: 2019-02-22 | Discharge: 2019-02-23 | Payer: PRIVATE HEALTH INSURANCE | Attending: Pediatric Hematology-Oncology | Primary: Pediatric Hematology-Oncology

## 2019-02-22 DIAGNOSIS — L309 Dermatitis, unspecified: Secondary | ICD-10-CM | POA: Diagnosis not present

## 2019-02-22 DIAGNOSIS — D126 Benign neoplasm of colon, unspecified: Secondary | ICD-10-CM | POA: Diagnosis not present

## 2019-02-22 DIAGNOSIS — Z01812 Encounter for preprocedural laboratory examination: Secondary | ICD-10-CM | POA: Diagnosis not present

## 2019-02-22 DIAGNOSIS — D485 Neoplasm of uncertain behavior of skin: Secondary | ICD-10-CM | POA: Diagnosis not present

## 2019-02-22 DIAGNOSIS — Z20828 Contact with and (suspected) exposure to other viral communicable diseases: Secondary | ICD-10-CM | POA: Diagnosis not present

## 2019-02-22 DIAGNOSIS — D481 Neoplasm of uncertain behavior of connective and other soft tissue: Secondary | ICD-10-CM | POA: Diagnosis not present

## 2019-02-22 DIAGNOSIS — R21 Rash and other nonspecific skin eruption: Secondary | ICD-10-CM | POA: Diagnosis not present

## 2019-02-22 DIAGNOSIS — L209 Atopic dermatitis, unspecified: Secondary | ICD-10-CM | POA: Diagnosis not present

## 2019-02-22 DIAGNOSIS — Q8789 Other specified congenital malformation syndromes, not elsewhere classified: Secondary | ICD-10-CM | POA: Diagnosis not present

## 2019-02-22 DIAGNOSIS — D235 Other benign neoplasm of skin of trunk: Secondary | ICD-10-CM | POA: Diagnosis not present

## 2019-02-22 DIAGNOSIS — L720 Epidermal cyst: Secondary | ICD-10-CM | POA: Diagnosis not present

## 2019-02-22 LAB — CBC W/ AUTO DIFF
BASOPHILS ABSOLUTE COUNT: 0.1 10*9/L (ref 0.0–0.1)
BASOPHILS RELATIVE PERCENT: 0.9 %
EOSINOPHILS ABSOLUTE COUNT: 0.2 10*9/L (ref 0.0–0.4)
EOSINOPHILS RELATIVE PERCENT: 3 %
HEMATOCRIT: 41.9 % (ref 36.0–46.0)
HEMOGLOBIN: 14.3 g/dL (ref 12.0–16.0)
LARGE UNSTAINED CELLS: 2 % (ref 0–4)
LYMPHOCYTES ABSOLUTE COUNT: 2.2 10*9/L (ref 1.5–5.0)
LYMPHOCYTES RELATIVE PERCENT: 32.7 %
MEAN CORPUSCULAR HEMOGLOBIN CONC: 34.1 g/dL (ref 31.0–37.0)
MEAN CORPUSCULAR HEMOGLOBIN: 28.6 pg (ref 26.0–34.0)
MEAN CORPUSCULAR VOLUME: 83.9 fL (ref 80.0–100.0)
MEAN PLATELET VOLUME: 10.2 fL — ABNORMAL HIGH (ref 7.0–10.0)
MONOCYTES ABSOLUTE COUNT: 0.4 10*9/L (ref 0.2–0.8)
MONOCYTES RELATIVE PERCENT: 6.1 %
NEUTROPHILS ABSOLUTE COUNT: 3.7 10*9/L (ref 2.0–7.5)
NEUTROPHILS RELATIVE PERCENT: 55.6 %
PLATELET COUNT: 152 10*9/L (ref 150–440)

## 2019-02-22 LAB — CO2: Carbon dioxide:SCnc:Pt:Ser/Plas:Qn:: 25

## 2019-02-22 LAB — COMPREHENSIVE METABOLIC PANEL
ALBUMIN: 4.3 g/dL (ref 3.5–5.0)
ALKALINE PHOSPHATASE: 64 U/L (ref 50–130)
ALT (SGPT): 9 U/L (ref <35)
ANION GAP: 9 mmol/L (ref 7–15)
AST (SGOT): 23 U/L (ref 14–38)
BILIRUBIN TOTAL: 0.6 mg/dL (ref 0.0–1.2)
BLOOD UREA NITROGEN: 11 mg/dL (ref 7–21)
BUN / CREAT RATIO: 14
CHLORIDE: 107 mmol/L (ref 98–107)
CO2: 25 mmol/L (ref 22.0–30.0)
CREATININE: 0.78 mg/dL (ref 0.60–1.00)
EGFR CKD-EPI AA FEMALE: >90 mL/min/{1.73_m2} (ref >=60)
EGFR CKD-EPI NON-AA FEMALE: >90 mL/min/{1.73_m2} (ref >=60)
GLUCOSE RANDOM: 83 mg/dL (ref 70–179)
POTASSIUM: 4.3 mmol/L (ref 3.5–5.0)
PROTEIN TOTAL: 7.4 g/dL (ref 6.5–8.3)
SODIUM: 141 mmol/L (ref 135–145)

## 2019-02-22 LAB — LYMPHOCYTES RELATIVE PERCENT: Lymphocytes/100 leukocytes:NFr:Pt:Bld:Qn:Automated count: 32.7

## 2019-02-22 MED ORDER — FEXOFENADINE 180 MG TABLET
ORAL_TABLET | ORAL | 3 refills | 0.00000 days | Status: CP
Start: 2019-02-22 — End: ?

## 2019-02-22 MED ORDER — TACROLIMUS 0.1 % TOPICAL OINTMENT
Freq: Two times a day (BID) | TOPICAL | 11 refills | 30.00000 days | Status: CP
Start: 2019-02-22 — End: 2020-02-22

## 2019-02-22 NOTE — Unmapped (Signed)
Dermatology Patient Visit    ASSESSMENT/PLAN:    Eczematous Dermatitis with highly reactive skin and dermatographism, possibly induced by Sorafenib use  - Discussed etiology and treatment options  - Start allegra twice daily  - Start protopic ointment - apply to the affected areas of the face twice daily until clear  - Start clobetasol solution on the scalp 1-2 times daily if needed for flaring. Avoid the face.  - Start clobetasol ointment on the hands and feet twice daily if needed for itching and scaling. Use for 1-2 weeks, then stop. Re-start if needed.  - Start triamcinolone ointment twice daily on dry, itchy, scaly areas on the body and lower lip for 1-2 weeks, then stop. Re-start if needed.  - Discussed appropriate use of topical steroids and reassured that if used only for the duration suggested, she is unlikely to absorb appreciable amounts systemically.   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  - Managed by Hem/Onc (Dr. Ciro Backer), GI (plan for colonoscopy Dec 2020), and Vascular (Dr. Selena Batten).  - Hx of multiple desmoid tumors??(post cryotherapy by Dr. Selena Batten of VIR 08/2018 and steroid injections to R forearm), epidermal cysts, and GI polyps. She has had multiple mass excisions most recently by Dr. Lucretia Roers of plastic??surgery??02/2017 with inclusion cysts and osteomas, no malignancy;??jaw pits, which are being followed by dental, several removed over past yr.     Education was provided by discussing the etiology, natural history, course and treatment for the above conditions.  Reassurance and anticipatory guidance were provided  The family has my contact information and knows to contact me for any questions or concerns or changes in the patient's condition.    FOLLOWUP:  Return in about 6 weeks (around 04/05/2019) for Recheck.   Send portal message if skin worsens    Chief complaint:  Follow-up    History of present illness: [name] [name]  is a 18 y.o. female last seen by Dr. Kateri Mc in 01/2019 for a hair loss, erythrodysesthesia and xerosis likely secondary to treatment of her [name] with Sorafenib, and returns today for a follow-up appointment.    At the last appointment, she was given triamcinolone for her body, protopic for her face, clobetasol for her hands and feet and clobetasol solution for her scalp to help with the eczematous eruption that she had secondary to her sorafenib use. She states that she was never able to obtain the protopic for her face, and that she never used the other medications, as she became sick with covid and was unable to receive the sorafenib and did not need the medications. She has since re-started the sorafenib at a half dose a little over a week ago, and has started to get rashes again. She states that when she takes a shower, the water makes her skin itchy and then it feels painful. She gets hives that eventually will calm back down. The skin on her face under her eyebrows and on her arms bother her the most.    She is scheduled for a colonoscopy later in the week.    Past medical history:  [name] on sorafenib - history of multiple desmoid tumors s/p cryotherapy and steroid injections, epidermal cysts, GI polyps, jaw pits, osteomas    Family history:  No family history of [name]    Social History:  Consulting civil engineer at [employer]    Review of systems:  No fevers or chills. Negative  unless otherwise noted in the HPI.    Physical exam:  General: Patient is well-appearing, in no acute distress, and well-groomed.    Neuro: Alert and age appropriate interaction, with pleasant and appropriate mood and affect.   Skin: Inspection and palpation of the face, neck, chest, abdomen, back, bilateral upper extremities was performed and revealed:  - very thin erythematous eczematous patches on the bilateral cheeks, chest, and arms  - positive for dermatographism The patient was seen and examined by Johnney Killian, MD who agrees with the assessment and plan as above.

## 2019-02-22 NOTE — Unmapped (Signed)
COVID Pre-Procedure Intake Form     Assessment     [name] [name] is a 18 y.o. female presenting to [facility] for COVID testing.     Plan     If no testing performed, pt counseled on routine care for respiratory illness.  If testing performed, COVID sent.  Patient directed to Home given findings during today's visit.    Subjective     [name] [name] is a 18 y.o. female who [facility] with complaints of the following:    Exposure History: In the last 21 days?     Have you traveled outside of [place]? Yes               Have you been in close contact with someone confirmed by a test to have COVID? (Close contact is within 6 feet for at least 10 minutes) No       Have you worked in a health care facility? No     Lived or worked facility like a nursing home, group home, or assisted living?    No         Are you scheduled to have surgery or a procedure in the next 3 days? Yes               Are you scheduled to receive cancer chemotherapy within the next 7 days?    Yes     Have you ever been tested before for COVID-19 with a swab of your nose? Yes: When: 02/09/19, Where: [facility], [place]   Are you a healthcare worker being tested so to return to work No     Right now,  do you have any of the following that developed over the past 7 days (as stated by patient on intake form):    Subjective fever (felt feverish) No   Chills (especially repeated shaking chills) No   Severe fatigue (felt very tired) No   Muscle aches No   Runny nose No   Sore throat No   Loss of taste or smell No   Cough (new onset or worsening of chronic cough) No   Shortness of breath No   Nausea or vomiting No   Headache No   Abdominal Pain No   Diarrhea (3 or more loose stools in last 24 hours) No Scribe's Attestation: Aida Puffer, FNP obtained and performed the history, physical exam and medical decision making elements that were entered into the chart.  Signed by Edison Simon serving as Scribe, on 02/22/2019 11:24 AM      The documentation recorded by the scribe accurately reflects the service I personally performed and the decisions made by me. Aida Puffer, FNP  February 22, 2019 6:41 PM

## 2019-02-22 NOTE — Unmapped (Addendum)
Take allegra twice daily     Start protopic ointment on the affected areas of the face twice daily until clear  Start clobetasol solution on the scalp twice daily if needed for itching and redness  Start clobetasol ointment on the hands and feet if needed for itching and scaling. Use this for 1-2 weeks, then stop. Re-start if needed  - Start triamcinolone ointment twice daily on dry, itchy, scaly areas on the body and lip for 1-2 weeks, then stop. Re-start if needed.

## 2019-02-22 NOTE — Unmapped (Signed)
02/21/19    Travel Screening Questions Completed.    Travel Screening Questions/Answers:  1). Have you traveled out of [place] within the last 14 days?: Yes  2). Do you have any of the following symptoms that are new or worsening: cough, shortness of breath, loss of taste/smell, sore throat, fever or feeling feverish, repeated shaking chills, muscle pain, vomiting, or diarrhea?: Yes  3). Have you had close contact with a person with confirmed COVID-19 in the last 21 days before symptoms began?: Yes  4). Have you tested positive in the last 21 days for COVID-19?: Yes    PT has had NEGATIVE COVID test 2 weeks ago, after previously having COVID    Negative Travel Screen: Patient answered NO to questions 2, 3, and 4. Offer Telephone visit, Virtual Visit or In Person Visit according to the current scheduling protocol for your clinic.       The call was handled in the following manner: Scheduled for In Person Visit

## 2019-02-23 NOTE — Unmapped (Signed)
PA initiated via [facility] for tacrolimus ointment. Key is [id number].    Per CMM, PA has been approved through 02/22/20. Called [facility] to let them know. Also notified Mandee via MyChart.

## 2019-02-23 NOTE — Unmapped (Signed)
LVM to schedule 6 week f/u. Pt mom prefers to be schedule at [facility] location.

## 2019-02-25 ENCOUNTER — Encounter
Admit: 2019-02-25 | Discharge: 2019-02-25 | Payer: PRIVATE HEALTH INSURANCE | Attending: Anesthesiology | Primary: Anesthesiology

## 2019-02-25 ENCOUNTER — Encounter: Admit: 2019-02-25 | Discharge: 2019-02-25 | Payer: PRIVATE HEALTH INSURANCE

## 2019-02-25 DIAGNOSIS — D125 Benign neoplasm of sigmoid colon: Secondary | ICD-10-CM | POA: Diagnosis not present

## 2019-02-25 DIAGNOSIS — Z1211 Encounter for screening for malignant neoplasm of colon: Secondary | ICD-10-CM | POA: Diagnosis not present

## 2019-02-25 DIAGNOSIS — K635 Polyp of colon: Secondary | ICD-10-CM | POA: Diagnosis not present

## 2019-02-25 DIAGNOSIS — E079 Disorder of thyroid, unspecified: Secondary | ICD-10-CM | POA: Diagnosis not present

## 2019-02-25 DIAGNOSIS — D126 Benign neoplasm of colon, unspecified: Secondary | ICD-10-CM | POA: Diagnosis not present

## 2019-02-25 DIAGNOSIS — Z8371 Family history of colonic polyps: Secondary | ICD-10-CM | POA: Diagnosis not present

## 2019-02-25 DIAGNOSIS — K219 Gastro-esophageal reflux disease without esophagitis: Secondary | ICD-10-CM | POA: Diagnosis not present

## 2019-02-25 DIAGNOSIS — D124 Benign neoplasm of descending colon: Secondary | ICD-10-CM | POA: Diagnosis not present

## 2019-02-25 DIAGNOSIS — Z86010 Personal history of colonic polyps: Secondary | ICD-10-CM | POA: Diagnosis not present

## 2019-02-25 DIAGNOSIS — K621 Rectal polyp: Secondary | ICD-10-CM | POA: Diagnosis not present

## 2019-02-25 MED ADMIN — lidocaine (XYLOCAINE) 20 mg/mL (2 %) injection: INTRAVENOUS | @ 15:00:00 | Stop: 2019-02-25

## 2019-02-25 MED ADMIN — propofoL (DIPRIVAN) injection: INTRAVENOUS | @ 16:00:00 | Stop: 2019-02-25

## 2019-02-25 MED ADMIN — propofoL (DIPRIVAN) injection: INTRAVENOUS | @ 15:00:00 | Stop: 2019-02-25

## 2019-02-25 MED ADMIN — midazolam (VERSED) injection: INTRAVENOUS | @ 15:00:00 | Stop: 2019-02-25

## 2019-02-25 MED ADMIN — lactated ringers bolus 750 mL: 750 mL | INTRAVENOUS | @ 15:00:00 | Stop: 2019-02-25

## 2019-02-25 MED ADMIN — ondansetron (ZOFRAN) injection: INTRAVENOUS | @ 15:00:00 | Stop: 2019-02-25

## 2019-02-25 MED ADMIN — lactated Ringers infusion: INTRAVENOUS | @ 15:00:00 | Stop: 2019-02-25

## 2019-03-01 MED FILL — NEXAVAR 200 MG TABLET: 15 days supply | Qty: 15 | Fill #3 | Status: AC

## 2019-03-01 MED FILL — NEXAVAR 200 MG TABLET: ORAL | 15 days supply | Qty: 15 | Fill #3

## 2019-03-01 NOTE — Unmapped (Signed)
[facility] Refill Coordination Note    Specialty Medication(s) to be Shipped:   Hematology/Oncology: Nexavar    Other medication(s) to be shipped:       [name] [name], DOB: 2000-04-16  Phone: [phone] (home)       All above HIPAA information was verified with patient.     Was a Nurse, learning disability used for this call? No    Completed refill call assessment today to schedule patient's medication shipment from the [facility] [phone]).       Specialty medication(s) and dose(s) confirmed: Regimen is correct and unchanged.   Changes to medications: [name] [name] reports no changes at this time.  Changes to insurance: No  Questions for the pharmacist: No    Confirmed patient received Welcome Packet with first shipment. The patient will receive a drug information handout for each medication shipped and additional FDA Medication Guides as required.       DISEASE/MEDICATION-SPECIFIC INFORMATION        N/A    SPECIALTY MEDICATION ADHERENCE     Medication Adherence    Patient reported X missed doses in the last month: 10  Specialty Medication: nexavar 200mg    Patient is on additional specialty medications: No  Informant: patient   Other non-adherence reason: due to covid pt provider instructed her to stop for 10 days                 nexavar 200 mg: 5 days of medicine on hand         SHIPPING     Shipping address confirmed in Epic.     Delivery Scheduled: Yes, Expected medication delivery date: 12/23.     Medication will be delivered via UPS to the prescription address in Epic WAM.    Antonietta Barcelona   [facility] Specialty Technician

## 2019-03-07 NOTE — Unmapped (Signed)
[facility] Refill Coordination Note    Specialty Medication(s) to be Shipped:   Hematology/Oncology: Nexavar    Other medication(s) to be shipped: n/a     [name] [name], DOB: [birth year]  Phone: [phone] (home)       All above HIPAA information was verified with patient.     Was a Nurse, learning disability used for this call? No    Completed refill call assessment today to schedule patient's medication shipment from the [facility] [phone]).       Specialty medication(s) and dose(s) confirmed: Regimen is correct and unchanged.   Changes to medications: [name] [name] reports no changes at this time.  Changes to insurance: No  Questions for the pharmacist: No    Confirmed patient received Welcome Packet with first shipment. The patient will receive a drug information handout for each medication shipped and additional FDA Medication Guides as required.       DISEASE/MEDICATION-SPECIFIC INFORMATION        N/A    SPECIALTY MEDICATION ADHERENCE     Medication Adherence    Patient reported X missed doses in the last month: 0  Specialty Medication: Nexavar  Patient is on additional specialty medications: No  Patient is on more than two specialty medications: No  Any gaps in refill history greater than 2 weeks in the last 3 months: no  Demonstrates understanding of importance of adherence: yes  Informant: patient                Nexavar 200mg : Patient has 9 days of medication on hand      SHIPPING     Shipping address confirmed in Epic.     Delivery Scheduled: Yes, Expected medication delivery date: 03/17/19.     Medication will be delivered via UPS to the prescription address in Epic WAM.    Olga Millers   [facility] Specialty Technician

## 2019-03-16 NOTE — Unmapped (Signed)
[name] [name] 's NEXAVAR shipment will be delayed due to Insufficient inventory We have contacted the patient and left a message We will wait for a call back from the patient/caregiver to reschedule the delivery.  We have NOT confirmed the delivery date.

## 2019-03-16 NOTE — Unmapped (Signed)
Delivery set up for 03/18/19

## 2019-03-17 DIAGNOSIS — D481 Neoplasm of uncertain behavior of connective and other soft tissue: Principal | ICD-10-CM

## 2019-03-17 DIAGNOSIS — Q8789 Other specified congenital malformation syndromes, not elsewhere classified: Principal | ICD-10-CM

## 2019-03-17 NOTE — Unmapped (Signed)
[name] [name] 's Nexavar shipment will be delayed as a result of a high copay.     I have reached out to the patient and left a voicemail message.  We will wait for a call back from the patient to reschedule the delivery.  We have not confirmed the new delivery date.  High copay referral entered.

## 2019-03-18 ENCOUNTER — Encounter: Admit: 2019-03-18 | Discharge: 2019-03-19 | Payer: PRIVATE HEALTH INSURANCE | Attending: Surgery | Primary: Surgery

## 2019-03-18 DIAGNOSIS — D481 Neoplasm of uncertain behavior of connective and other soft tissue: Principal | ICD-10-CM

## 2019-03-18 MED FILL — NEXAVAR 200 MG TABLET: 30 days supply | Qty: 30 | Fill #4 | Status: AC

## 2019-03-18 MED FILL — NEXAVAR 200 MG TABLET: ORAL | 30 days supply | Qty: 30 | Fill #4

## 2019-03-18 NOTE — Unmapped (Signed)
[name] [name] 's NEXAVAR shipment will be sent out  as a result of copay is now approved by patient/caregiver.      I have reached out to the patient and communicated the delivery change. We will reschedule the medication for the delivery date that the patient agreed upon.  We have confirmed the delivery date as 03/21/19 VIA UPS

## 2019-03-19 NOTE — Unmapped (Signed)
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 Venia Carbon had long discussion with [name] [name] and her Parents regarding surgical removal of her skin desmoids. [name] and her Parents have concern with the length of the incision possibly required to remove the desmoids They are inquiring about other techniques that maybe available to have them removed. He discussed given the location of the desmoid it is more difficult to remove from the skin without making an incision long enough to remove the desmoid it is entirety as you do not want spillage as well as want to remove the whole capsule to help preventing regrowth. She has had cryoblation to others and these are not amendable. She is currently getting treatment with Nexavar and hope is they will shrink more allowing surgery to be less challenging.  - we will revaluate in April after her imaging ordered by Dr. Ciro Backer to assess the percentage of shrinkage of her desmoids to then make a decision about surgery      Thank you for choosing [facility]. We appreciate the opportunity to care for [name] [name]. Please call us at [phone].      Primary Care Physician:  Barnet Glasgow, MD    Chief Complaint:  [name] [name] is seen in consultation at the request of Dr. Ciro Backer for desmoids of the subcutaneous skin    HPI: [name] [name] is a 19 yr old female with [name] who has had previous surgeries for multiple skin desmoids as well as recent cryoblation to ones on her arms by Dr. Selena Batten in [facility]. She has had skin ones removed by Dr. Lucretia Roers in plastic and hx of ones removed on her back that was a joint case with Dr.Weiner and neurosurgery. She has had new reecnt ones appear in the right and left chest area with ne on right that is near her right breast has been causing pain as well as two on her back that have started to cause some pain with one on the left side with concern from nerve pinching as she has had some numbness in her arms. She has one on her right calf that Dr. Ciro Backer is sing to monitor effects on Nexavar that has been started. She has had recent reactions to the chemo such as hair loss, dehydration, nausea and fatigue .Sh is screen by GI for abdominal desmoids    Allergies:  Neomycin    Medications:   Current Outpatient Medications   Medication Sig Dispense Refill   ??? clobetasoL (TEMOVATE) 0.05 % external solution Apply topically Two (2) times a day. To the scalp as needed for itching, redness. 50 mL 5   ??? famotidine (PEPCID) 40 MG tablet Take 40 mg by mouth daily.     ??? fexofenadine (ALLEGRA) 180 MG tablet Take one pill twice daily 100 tablet 3   ??? ondansetron (ZOFRAN) 8 MG tablet Take 1 tablet (8 mg total) by mouth every twelve (12) hours as needed for nausea. 60 tablet 2   ??? SORAfenib (NEXAVAR) 200 mg tablet Take 1 tablet (200 mg total) by mouth daily. 30 tablet 2   ??? tacrolimus (PROTOPIC) 0.1 % ointment Apply 1 application topically Two (2) times a day. To rash on face 30 g 11   ??? triamcinolone (KENALOG) 0.1 % ointment Apply topically Two (2) times a day. To dry, scaly areas on the  body for 1-2 weeks, then stop. Repeat as needed. 453 g 4   ??? clobetasoL (TEMOVATE) 0.05 % ointment Apply topically Two (2) times a day. To the hands and feet when itching, scaling. Use for 1-2 weeks, then stop. Repeat as needed. 60 g 3     No current facility-administered medications for this visit.        Past Medical History:  Past Medical History:   Diagnosis Date   ??? [name]    ??? Disease of thyroid gland     multiple sub-5 mm cystic foci most compatible with colloid cysts, per Thyroid US   ??? [name] (familial adenomatous polyposis)    ??? [name]    ??? Intestinal polyps    ??? Vision problems        Past Surgical History:  Past Surgical History:   Procedure Laterality Date   ??? cystis removal     ??? desmoid removal     ??? PR COLONOSCOPY W/BIOPSY SINGLE/MULTIPLE N/A 10/27/2012    Procedure: COLONOSCOPY, FLEXIBLE, PROXIMAL TO SPLENIC FLEXURE; WITH BIOPSY, SINGLE OR MULTIPLE;  Surgeon: Shirlyn Goltz Mir, MD;  Location: PEDS PROCEDURE ROOM [facility];  Service: Gastroenterology   ??? PR COLONOSCOPY W/BIOPSY SINGLE/MULTIPLE N/A 09/14/2013    Procedure: COLONOSCOPY, FLEXIBLE, PROXIMAL TO SPLENIC FLEXURE; WITH BIOPSY, SINGLE OR MULTIPLE;  Surgeon: Shirlyn Goltz Mir, MD;  Location: PEDS PROCEDURE ROOM [facility];  Service: Gastroenterology   ??? PR COLONOSCOPY W/BIOPSY SINGLE/MULTIPLE N/A 11/08/2014    Procedure: COLONOSCOPY, FLEXIBLE, PROXIMAL TO SPLENIC FLEXURE; WITH BIOPSY, SINGLE OR MULTIPLE;  Surgeon: Arnold Long Mir, MD;  Location: PEDS PROCEDURE ROOM [facility];  Service: Gastroenterology   ??? PR COLONOSCOPY W/BIOPSY SINGLE/MULTIPLE N/A 12/26/2015    Procedure: COLONOSCOPY, FLEXIBLE, PROXIMAL TO SPLENIC FLEXURE; WITH BIOPSY, SINGLE OR MULTIPLE;  Surgeon: Arnold Long Mir, MD;  Location: PEDS PROCEDURE ROOM [facility] [facility] [name];  Service: Gastroenterology   ??? PR COLONOSCOPY W/BIOPSY SINGLE/MULTIPLE N/A 09/02/2017    Procedure: COLONOSCOPY, FLEXIBLE, PROXIMAL TO SPLENIC FLEXURE; WITH BIOPSY, SINGLE OR MULTIPLE;  Surgeon: Arnold Long Mir, MD;  Location: PEDS PROCEDURE ROOM [facility];  Service: Gastroenterology   ??? PR COLSC FLX W/REMOVAL LESION BY HOT BX FORCEPS N/A 08/27/2016    Procedure: COLONOSCOPY, FLEXIBLE, PROXIMAL TO SPLENIC FLEXURE; W/REMOVAL TUMOR/POLYP/OTHER LESION, HOT BX FORCEP/CAUTE;  Surgeon: Arnold Long Mir, MD;  Location: PEDS PROCEDURE ROOM [facility];  Service: Gastroenterology   ??? PR COLSC FLX W/RMVL OF TUMOR POLYP LESION SNARE TQ N/A 02/25/2019    Procedure: COLONOSCOPY FLEX; W/REMOV TUMOR/LES BY SNARE;  Surgeon: Helyn Numbers, MD;  Location: GI PROCEDURES [facility];  Service: Gastroenterology   ??? PR EXC SKIN BENIG 2.1-3 CM TRUNK,ARM,LEG Right 02/25/2017    Procedure: EXCISION, BENIGN LESION INCLUDE MARGINS, EXCEPT SKIN TAG, LEGS; EXCISED DIAMETER 2.1 TO 3.0 CM;  Surgeon: Clarene Duke, MD;  Location: CHILDRENS OR [facility];  Service: Plastics   ??? PR EXC SKIN BENIG 3.1-4 CM TRUNK,ARM,LEG Right 02/25/2017    Procedure: EXCISION, BENIGN LESION INCLUDE MARGINS, EXCEPT SKIN TAG, ARMS; EXCISED DIAMETER 3.1 TO 4.0 CM;  Surgeon: Clarene Duke, MD;  Location: CHILDRENS OR [facility];  Service: Plastics   ??? PR EXC SKIN BENIG >4 CM FACE,FACIAL Right 02/25/2017    Procedure: EXCISION, OTHER BENIGN LES INCLUD MARGIN, FACE/EARS/EYELIDS/NOSE/LIPS/MUCOUS MEMBRANE; EXCISED DIAM >4.0 CM;  Surgeon: Clarene Duke, MD;  Location: CHILDRENS OR [facility] [name];  Service: Plastics   ??? PR UPPER GI ENDOSCOPY,BIOPSY N/A 10/27/2012    Procedure: UGI ENDOSCOPY; WITH BIOPSY, SINGLE OR MULTIPLE;  Surgeon: Shirlyn Goltz  Mir, MD;  Location: PEDS PROCEDURE ROOM [facility];  Service: Gastroenterology   ??? PR UPPER GI ENDOSCOPY,BIOPSY N/A 09/14/2013    Procedure: UGI ENDOSCOPY; WITH BIOPSY, SINGLE OR MULTIPLE;  Surgeon: Shirlyn Goltz Mir, MD;  Location: PEDS PROCEDURE ROOM [facility];  Service: Gastroenterology   ??? PR UPPER GI ENDOSCOPY,BIOPSY N/A 11/08/2014    Procedure: UGI ENDOSCOPY; WITH BIOPSY, SINGLE OR MULTIPLE;  Surgeon: Arnold Long Mir, MD;  Location: PEDS PROCEDURE ROOM [facility];  Service: Gastroenterology   ??? PR UPPER GI ENDOSCOPY,BIOPSY N/A 12/26/2015    Procedure: UGI ENDOSCOPY; WITH BIOPSY, SINGLE OR MULTIPLE;  Surgeon: Arnold Long Mir, MD;  Location: PEDS PROCEDURE ROOM [facility] [name];  Service: Gastroenterology   ??? PR UPPER GI ENDOSCOPY,BIOPSY N/A 08/27/2016    Procedure: UGI ENDOSCOPY; WITH BIOPSY, SINGLE OR MULTIPLE;  Surgeon: Arnold Long Mir, MD;  Location: PEDS PROCEDURE ROOM [facility];  Service: Gastroenterology   ??? PR UPPER GI ENDOSCOPY,BIOPSY N/A 09/02/2017    Procedure: UGI ENDOSCOPY; WITH BIOPSY, SINGLE OR MULTIPLE;  Surgeon: Arnold Long Mir, MD;  Location: PEDS PROCEDURE ROOM [facility] [name];  Service: Gastroenterology   ??? TUMOR REMOVAL         Family History:  The patient's family history includes No Known Problems in her brother, father, maternal aunt, maternal grandfather, maternal uncle, mother, paternal aunt, paternal grandfather, paternal grandmother, paternal uncle, and sister; Other in her maternal grandmother; Stroke in her maternal grandmother..    Pertinent Family, Social History:  Tobacco use: denies  Alcohol use: denies  Drug use: denies  The patient lives with  parents.  Nursing school at [employer]    Review of Systems:  The 10 system ROS was negative apart from the pertinent positives/negatives in the HPI    Physical Exam:    BP 137/89  - Pulse 92  - Temp 36.5 ??C (97.7 ??F) (Temporal)  - Ht 166.5 cm (5' 5.55)  - Wt 50.6 kg (111 lb 9.6 oz)  - LMP 02/16/2019 (Approximate)  - BMI 18.26 kg/m??     Wt Readings from Last 3 Encounters:   03/18/19 50.6 kg (111 lb 9.6 oz) (19 %, Z= -0.88)*   02/25/19 49.9 kg (110 lb) (16 %, Z= -0.98)*   02/22/19 50.4 kg (111 lb 1.6 oz) (18 %, Z= -0.91)*     * Growth percentiles are based on CDC (Girls, 2-20 Years) data.       69 %ile (Z= 0.50) based on CDC (Girls, 2-20 Years) Stature-for-age data based on Stature recorded on 03/18/2019.Marland Kitchen    Ht Readings from Last 3 Encounters:   03/18/19 166.5 cm (5' 5.55) (69 %, Z= 0.50)*   02/25/19 165.1 cm (5' 5) (61 %, Z= 0.28)*   02/22/19 166.4 cm (5' 5.51) (69 %, Z= 0.48)*     * Growth percentiles are based on CDC (Girls, 2-20 Years) data.       8 %ile (Z= -1.38) based on AAP (Girls, 2-20 YEARS) BMI-for-age based on BMI available as of 03/18/2019.      Gen: No acute distress, well appearing and well nourished.  Head: Normocephalic, atraumatic.  Eyes: Conjuctiva and lids appear normal. Pupils equal and round, sclera anicteric.  Ears: Overall appearance normal with no scars, lesions.  Hearing is grossly normal.  Nose: Nares grossly normal, no drainage.  Throat: Lips, mucosa, and tongue normal.  Neck: Supple, symmetrical, trachea midline  Pulmonary: Normal respiratory effort.  Lungs were clear to auscultation bilaterally.  Cardiovascular: Regular rate and rhythm, no murmur noted.    Abdomen: Soft,  non-tender, without masses. No hernias appreciated.   MSK: Extremities without clubbing, cyanosis, or edema.  Skin: right chest near upper portion of breast with desmoid lesion, left upper chest wall with palpable rim of desmoid  Right side of back below scapula with palpable desmoid, left posterior neck upper back area with palpable desmoid  Right forearm with several desmoids  Right calf with desmoid  Neuro: No motor abnormalities noted.  Sensation grossly intact.      Studies:    Narrative & Impression     EXAM: MRI CHEST W WO CONTRAST MSK  DATE: 10/15/2018 3:37 PM  ACCESSION: [id number]  DICTATED: 10/15/2018 4:16 PM  INTERPRETATION LOCATION: [facility]  ??  CLINICAL INDICATION: 19 year old Female with desmoid tumor ; Chest wall mass or lump (Ped 0 - 18y)  - D48.1 - [name]    ??  COMPARISON: MRI of the chest from 02/22/2018, Concurrent MRI of the abdomen.  ??  TECHNIQUE: MRI of the chest was performed before and after administration of IV contrast using the body multicoil array.  Imaging was targeted to the sternoclavicular junction. Multisequence, multiplanar large field of view images were obtained.] Patient was imaged on 1.5 Tesla. 5 ml of Doteram was ministered.  ??  FINDINGS:  Exam is degraded by motion artifact.  ??  Redemonstration of lobular T1 and T2 hypointense mass anterior to the left pectoralis and medial to the left odontoid muscle. The medial portion of the mass insinuates into the pectoralis muscle, unchanged. The mass measures 5.3 x 1.1 (7:29), previously 5.4 x 1.5 cm on remeasurement. The mass exhibits no significant enhancement.  ??  Second mass is present posterior to the upper thoracic spine on the left and measures approximately 2.9 x 1.1 cm and extends approximately 9.3 cm along the spine, unchanged (7:22, 30:26).  ??  Third mass is seen posterior to the right fourth and fifth ribs and measures approximately 3.3 x 1.6 cm, previously 3.3 x 1.7 cm (7:26).  ??  There is an additional lesion which is more evident on today's examination at the anterior right chest wall. This lesion is directly inferior to the right clavicular head and measures approximately 4.5 x 1.2 x 6.3 cm. (8:38, 17:28)  ??   The cardiomediastinal structures are unremarkable. No pulmonary masses or consolidations are identified.  ??  IMPRESSION:  [name] in the left anterior chest wall has decreased in size with other lesions in the left posterior paraspinal region and posterior to the right fourth and fifth ribs, unchanged.  ??  Additional lesion at the right anterior chest wall is better visualized on today's examination than on prior exams but is similar in size in retrospect.     Reading Physician Reading Date Result Priority   Sunday Spillers, MD  [phone] 10/15/2018    One [facility] 10/15/2018    Dennison Nancy, MD  [phone] 10/15/2018       Narrative & Impression     EXAM: MRI abdomen with and without contrast  DATE: 10/15/2018 3:37 PM  ACCESSION: [id number]  DICTATED: 10/15/2018 4:04 PM  INTERPRETATION LOCATION: [facility]  ??  CLINICAL INDICATION: 19 year old Female with abdominal pain  gardner syndrome  - D48.5 - [name] of skin    ??  COMPARISON: Concurrent MRI of the chest  ??  TECHNIQUE: MRI of the abdomen was obtained with and without IV contrast. Multisequence, multiplanar images were obtained.      ??  FINDINGS:  ??  LOWER CHEST: Please see same day MRI of the chest for findings above the diaphragm.  ??  ABDOMEN:  ??  HEPATOBILIARY: 5 mm nonenhancing cyst in segment 7 (2:8). No biliary ductal dilatation. Gallbladder unremarkable  PANCREAS: Unremarkable.  SPLEEN: Unremarkable.  ADRENAL GLANDS: Unremarkable.  KIDNEYS/URETERS: 8 mm simple nonenhancing left renal cyst. No enhancing renal mass. No hydronephrosis.  BOWEL/PERITONEUM/RETROPERITONEUM: No bowel obstruction. No acute inflammatory process. No ascites.  VASCULATURE: Abdominal aorta within normal limits for patient's age. Unremarkable inferior vena cava. Portal-systemic and hepatic veins are patent.  LYMPH NODES: No adenopathy.  ??  BONES/SOFT TISSUES: Unremarkable.  ??  IMPRESSION:  No evidence of neoplasia in the abdomen.

## 2019-03-28 DIAGNOSIS — D481 Neoplasm of uncertain behavior of connective and other soft tissue: Secondary | ICD-10-CM | POA: Diagnosis not present

## 2019-03-31 ENCOUNTER — Encounter: Admit: 2019-03-31 | Discharge: 2019-04-01 | Payer: PRIVATE HEALTH INSURANCE

## 2019-03-31 DIAGNOSIS — L650 Telogen effluvium: Principal | ICD-10-CM

## 2019-03-31 DIAGNOSIS — L309 Dermatitis, unspecified: Principal | ICD-10-CM

## 2019-03-31 DIAGNOSIS — L858 Other specified epidermal thickening: Principal | ICD-10-CM

## 2019-03-31 NOTE — Unmapped (Addendum)
For your eczema:  - Continue your allegra 1-2 times daily  - Continue using the protopic ointment - apply twice daily on the face until clear  - On the scalp, use clobetasol solution - apply 1-2 times daily on the scalp if needed for flaring. Avoid the face.  - On the hands and feet, use clobetasol ointment twice daily if needed for itching and scaling for 1-2 weeks, then stop. Re-start if needed.  - On the body and lower lip, use triamcinolone ointment twice daily for 1-2 weeks, then stop. Re-start if needed for flaring.    On the bumps on the thighs:  - Start applying a cream to your thighs daily to help with the bumps. Over the counter options are called Eucerin Roughness Relief, amlactin, Cerave SA or Gold Bond Rough and Bumpy  - Continue to moisturize your legs with your cetaphil    On the Scalp:  - Apply rogaine (minoxidil) 5% solution or foam on the scalp daily.

## 2019-03-31 NOTE — Unmapped (Signed)
Dermatology Patient Visit    ASSESSMENT/PLAN:    Eczematous Dermatitis with highly reactive skin and dermatographism, possibly induced by Sorafenib use, improved  - Discussed etiology and treatment options  - Continue allegra twice daily  - Continue protopic ointment - apply to the affected areas of the face twice daily until clear  - Continue clobetasol solution on the scalp 1-2 times daily if needed for flaring. Avoid the face.  - Continue clobetasol ointment on the hands and feet twice daily if needed for itching and scaling. Use for 1-2 weeks, then stop. Re-start if needed.  - Continue triamcinolone ointment twice daily on dry, itchy, scaly areas on the body and lower lip for 1-2 weeks, then stop. Re-start if needed.    [name] [name] on sorafenib  Diagnosed around age 32.  - Managed by Hem/Onc (Dr. Ciro [name]), GI (plan for colonoscopy Dec 2020), and Vascular (Dr. Selena [name]).  - Hx of multiple desmoid tumors??(post cryotherapy by Dr. Selena [name] of [facility] 08/2018 and steroid injections to R forearm), epidermal cysts, and GI polyps. She has had multiple mass excisions most recently by Dr. Lucretia [name] of plastic??surgery??02/2017 with inclusion cysts and osteomas, no malignancy;??jaw pits, which are being followed by dental, several removed over past yr.    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 Recommend to avoid the face  - Reassured that washing her hair is not exacerbating the hair loss Education was provided by discussing the etiology, natural history, course and treatment for the above conditions.  Reassurance and anticipatory guidance were provided  The family has my contact information and knows to contact me for any questions or concerns or changes in the patient's condition.    FOLLOWUP:  Return in about 3 months (around 06/29/2019) for Recheck.     Chief complaint:  Follow-up    History of present illness:  [name] [name]  is a 19 y.o. female last seen by Dr. Kateri [name] in 02/2019 for an eczematous dermatitis likely secondary to treatment of her [name] [name] with Sorafenib, and returns today for a follow-up appointment.    She reports that the dermatitis is much improved from her last appointment with the use of protopic on her face and moisturizing regularly with cetaphil. She has only sparingly used the triamcinolone on her body and has not used the clobetasol on her scalp. She is having itchy bumps on her thighs that have always been there, but have been worse in the past few weeks. She has tried treating with triamcinolone a few times without improvement.    She also notes significant hair shedding in the past week. She has tried to not wash her hair more than 2 times a week in fear that she will make her hair loss worse. She has not noticed any discrete areas of complete loss. She is not experiencing any itching or burning on her scalp. When she was previously on full dose sorafenib, she did have some hair shedding around 3 weeks out from starting. Now she is about 6 weeks out on half dose  of sorafenib and wonders if this could be related. Notably, she did have covid in 01/2019.    She is scheduled for an MRI on April 2nd to evaluate for the impact of sorafenib on shrinking her desmoid tumors on her chest and back.     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 Negative unless otherwise noted in the HPI.    Physical exam:  General: Patient is well-appearing, in no acute distress, and well-groomed.    Neuro: Alert and age appropriate interaction, with pleasant and appropriate mood and affect.   Skin: Inspection and palpation of the face, neck, chest, abdomen, back, bilateral upper extremities was performed and revealed:  - follicularly based erythematous papules on the bilateral thighs  - Xerosis of the legs   - positive for pale dermatographism   - positive hair pull test  - no discrete areas of alopecia    The patient was seen and examined by [name] Killian, MD who agrees with the assessment and plan as above.

## 2019-04-01 NOTE — Unmapped (Signed)
Doing well though more hair loss--thinning not bald on 100mg  a d sorafenib. Seeing derm. Cant tell yet whether change in lesions. Dr Loletta Parish 02/2019 colonscopy There were probably 200-300 polyps all of which were less than 10 mm.  Only a handful were larger than 5 mm - 13 polyps were removed with a cold snare. hasnt started sulindac.    Blood work fine. Await ekg.. due to see me in April with mri of chest to look at past boney lesions

## 2019-04-07 DIAGNOSIS — D481 Neoplasm of uncertain behavior of connective and other soft tissue: Principal | ICD-10-CM

## 2019-04-11 DIAGNOSIS — Q8789 Other specified congenital malformation syndromes, not elsewhere classified: Principal | ICD-10-CM

## 2019-04-11 DIAGNOSIS — D481 Neoplasm of uncertain behavior of connective and other soft tissue: Principal | ICD-10-CM

## 2019-04-11 MED ORDER — SORAFENIB 200 MG TABLET
ORAL_TABLET | Freq: Every day | ORAL | 2 refills | 30.00000 days | Status: CP
Start: 2019-04-11 — End: 2019-07-10

## 2019-04-12 NOTE — Unmapped (Signed)
Multidisciplinary Oncology Program Intake Form    Referral Receive Date: 04/07/19    Reason for Referral: [name]  ? Initial Consultation: No treatment started for diagnosis  ? Disease Group: Gastrointestinal   ? Speciality: Surgery    Referral Method: [employer]  ? Primary Insurance: Private/ACA/Employer   ? Authorization Obtained: NA    Record Collection:  ? Requested: N/A  ? Date Requested: N/A  ? Date Received: N/A  ? External Facility Name: N/A  ? Phone / Fax: N/A    ? Requested: N/A  ? Date Requested: N/A  ? Date Received: N/A  ? External Facility Name: N/A  ? Phone / Fax: N/A    ? Requested: N/A  ? Date Requested: N/A  ? Date Received: N/A  ? External Facility Name: N/A  ? Phone / Fax: N/A    Close the Loop Communication:    ? Referring Provider Contacted: No  ? Patient Contacted: Yes    Welcome Packet   Date Sent: 04/12/2019  Sent: Email

## 2019-04-18 NOTE — Unmapped (Unsigned)
The [facility] has made a third and final attempt to reach this patient to refill the following medication:Nexavar 200mg .      We have left voicemails on the following phone numbers: [phone], [phone].    Dates contacted: 01/25, 02/01, 02/08  Last scheduled delivery: 01/08 for 30 day supply    The patient may be at risk of non-compliance with this medication. The patient should call the [facility] at [phone] (option 4) to refill medication.    [name] [name]   [facility] Specialty Technician

## 2019-05-06 ENCOUNTER — Encounter: Admit: 2019-05-06 | Discharge: 2019-05-07 | Payer: PRIVATE HEALTH INSURANCE | Attending: Surgery | Primary: Surgery

## 2019-05-06 DIAGNOSIS — D481 Neoplasm of uncertain behavior of connective and other soft tissue: Principal | ICD-10-CM

## 2019-05-06 DIAGNOSIS — Q8789 Other specified congenital malformation syndromes, not elsewhere classified: Secondary | ICD-10-CM | POA: Diagnosis not present

## 2019-05-06 NOTE — Unmapped (Signed)
Referring physician:   Jacqualin Combes    Primary physician:  Barnet Glasgow, MD    Consulting physicians:  Debbe Mounts, MD - Peds Heme/Onc  Cherlynn Perches, MD - Ortho Onc Surgery  Jobe Gibbon, MD - VIR  Twana First, MD - Local Surgeon    Diagnosis:  [name]    Reason for Visit: 2 New desmoid tumors (thoracic, A&P)    HISTORY OF PRESENT ILLNESS:  [name] [name] is a 19 y.o. female who is referred in consultation by Dr. 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 Pt currently has 7 known desmoid tumors, the two newest and most concerning tumors which prompted today's visit are 1. Anterior chest, right sternal border and 2. Right posterior trunk, midline below scapula. (location of all currently know tumors listed below). These newest lesions are painful and less superficial than prior lesions. She is currently being treated with sorafenib by Dr Ciro Backer (Oct 2020- present), causing alopecia, labile HR (30-140's per pt), and syncope. She has a history of bilateral osteomas at the mastoid, epidermoid cysts, and a total of 10 desmoid tumors that have previously been removed starting at age 28. She maintains annual colonoscopies and endocrine evaluation for thyroid nodules. She is an otherwise active youth pursuing a degree in nursing at [employer]. She is accompanied today by her mother.     Current Desmoids Tumors:  (2) Right forearm, deep unresectable -Dr Darral Dash  (1) RLE, superficial lateral calf -untreated  (2) Left anterior chest/ left axilla - treated w/ steroid & cryo - Dr Selena Batten, VIR  (1) Right anterior chest/RSB, between muscle and chest wall - untreated NEW  (1) Right posterior trunk, midline below scapula - untreated NEW    History:  Past Medical History:   Diagnosis Date   ??? [name]    ??? Disease of thyroid gland     multiple sub-5 mm cystic foci most compatible with colloid cysts, per Thyroid US   ??? [name] (familial adenomatous polyposis)    ??? [name]    ??? Intestinal polyps    ??? Vision problems      Past Surgical History:   Procedure Laterality Date   ??? cystis removal     ??? desmoid removal     ??? PR COLONOSCOPY W/BIOPSY SINGLE/MULTIPLE N/A 10/27/2012    Procedure: COLONOSCOPY, FLEXIBLE, PROXIMAL TO SPLENIC FLEXURE; WITH BIOPSY, SINGLE OR MULTIPLE;  Surgeon: Shirlyn Goltz Mir, MD;  Location: PEDS PROCEDURE ROOM [facility];  Service: Gastroenterology   ??? PR COLONOSCOPY W/BIOPSY SINGLE/MULTIPLE N/A 09/14/2013    Procedure: COLONOSCOPY, FLEXIBLE, PROXIMAL TO SPLENIC FLEXURE; WITH BIOPSY, SINGLE OR MULTIPLE;  Surgeon: Shirlyn Goltz Mir, MD;  Location: PEDS PROCEDURE ROOM [facility];  Service: Gastroenterology   ??? PR COLONOSCOPY W/BIOPSY SINGLE/MULTIPLE N/A 11/08/2014    Procedure: COLONOSCOPY, FLEXIBLE, PROXIMAL TO SPLENIC FLEXURE; WITH BIOPSY, SINGLE OR MULTIPLE;  Surgeon: Arnold Long Mir, MD;  Location: PEDS PROCEDURE ROOM [facility];  Service: Gastroenterology   ??? PR COLONOSCOPY W/BIOPSY SINGLE/MULTIPLE N/A 12/26/2015    Procedure: COLONOSCOPY, FLEXIBLE, PROXIMAL TO SPLENIC FLEXURE; WITH BIOPSY, SINGLE OR MULTIPLE;  Surgeon: Arnold Long Mir, MD;  Location: PEDS PROCEDURE ROOM [facility];  Service: Gastroenterology   ??? PR COLONOSCOPY W/BIOPSY SINGLE/MULTIPLE N/A 09/02/2017    Procedure: COLONOSCOPY, FLEXIBLE, PROXIMAL TO SPLENIC FLEXURE; WITH BIOPSY, SINGLE OR MULTIPLE;  Surgeon: Arnold Long Mir, MD;  Location: PEDS PROCEDURE ROOM [facility];  Service: Gastroenterology   ???  PR COLSC FLX W/REMOVAL LESION BY HOT BX FORCEPS N/A 08/27/2016    Procedure: COLONOSCOPY, FLEXIBLE, PROXIMAL TO SPLENIC FLEXURE; W/REMOVAL TUMOR/POLYP/OTHER LESION, HOT BX FORCEP/CAUTE;  Surgeon: Arnold Long Mir, MD;  Location: PEDS PROCEDURE ROOM [facility];  Service: Gastroenterology   ??? PR COLSC FLX W/RMVL OF TUMOR POLYP LESION SNARE TQ N/A 02/25/2019    Procedure: COLONOSCOPY FLEX; W/REMOV TUMOR/LES BY SNARE;  Surgeon: Helyn Numbers, MD;  Location: GI PROCEDURES [facility];  Service: Gastroenterology   ??? PR EXC SKIN BENIG 2.1-3 CM TRUNK,ARM,LEG Right 02/25/2017    Procedure: EXCISION, BENIGN LESION INCLUDE MARGINS, EXCEPT SKIN TAG, LEGS; EXCISED DIAMETER 2.1 TO 3.0 CM;  Surgeon: Clarene Duke, MD;  Location: CHILDRENS OR [facility];  Service: Plastics   ??? PR EXC SKIN BENIG 3.1-4 CM TRUNK,ARM,LEG Right 02/25/2017    Procedure: EXCISION, BENIGN LESION INCLUDE MARGINS, EXCEPT SKIN TAG, ARMS; EXCISED DIAMETER 3.1 TO 4.0 CM;  Surgeon: Clarene Duke, MD;  Location: CHILDRENS OR [facility];  Service: Plastics   ??? PR EXC SKIN BENIG >4 CM FACE,FACIAL Right 02/25/2017    Procedure: EXCISION, OTHER BENIGN LES INCLUD MARGIN, FACE/EARS/EYELIDS/NOSE/LIPS/MUCOUS MEMBRANE; EXCISED DIAM >4.0 CM;  Surgeon: Clarene Duke, MD;  Location: CHILDRENS OR [facility];  Service: Plastics   ??? PR UPPER GI ENDOSCOPY,BIOPSY N/A 10/27/2012    Procedure: UGI ENDOSCOPY; WITH BIOPSY, SINGLE OR MULTIPLE;  Surgeon: Shirlyn Goltz Mir, MD;  Location: PEDS PROCEDURE ROOM [facility];  Service: Gastroenterology   ??? PR UPPER GI ENDOSCOPY,BIOPSY N/A 09/14/2013    Procedure: UGI ENDOSCOPY; WITH BIOPSY, SINGLE OR MULTIPLE;  Surgeon: Shirlyn Goltz Mir, MD;  Location: PEDS PROCEDURE ROOM [facility];  Service: Gastroenterology   ??? PR UPPER GI ENDOSCOPY,BIOPSY N/A 11/08/2014    Procedure: UGI ENDOSCOPY; WITH BIOPSY, SINGLE OR MULTIPLE;  Surgeon: Arnold Long Mir, MD;  Location: PEDS PROCEDURE ROOM [facility];  Service: Gastroenterology   ??? PR UPPER GI ENDOSCOPY,BIOPSY N/A 12/26/2015    Procedure: UGI ENDOSCOPY; WITH BIOPSY, SINGLE OR MULTIPLE;  Surgeon: Arnold Long Mir, MD;  Location: PEDS PROCEDURE ROOM [facility];  Service: Gastroenterology   ??? PR UPPER GI ENDOSCOPY,BIOPSY N/A 08/27/2016    Procedure: UGI ENDOSCOPY; WITH BIOPSY, SINGLE OR MULTIPLE;  Surgeon: Arnold Long Mir, MD;  Location: PEDS PROCEDURE ROOM [facility];  Service: Gastroenterology   ??? PR UPPER GI ENDOSCOPY,BIOPSY N/A 09/02/2017    Procedure: UGI ENDOSCOPY; WITH BIOPSY, SINGLE OR MULTIPLE;  Surgeon: Arnold Long Mir, MD;  Location: PEDS PROCEDURE ROOM [facility];  Service: Gastroenterology   ??? 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     FAMILY HISTORY:   Family History   Problem Relation Age of Onset   ??? No Known Problems Mother    ??? No Known Problems Father    ??? No Known Problems Sister    ??? No Known Problems Brother    ??? No Known Problems Maternal Aunt    ??? No Known Problems Maternal Uncle    ??? No Known Problems Paternal Aunt    ??? No Known Problems Paternal Uncle    ??? Stroke Maternal Grandmother    ??? Other Maternal Grandmother         benign lesions of liver and pancreas, further details unknown   ??? No Known Problems Maternal Grandfather    ??? No Known Problems Paternal Grandmother    ??? No Known Problems Paternal Grandfather    ??? Anesthesia problems Neg Hx    ??? Broken  bones Neg Hx    ??? Cancer Neg Hx    ??? Clotting disorder Neg Hx    ??? Collagen disease Neg Hx    ??? Diabetes Neg Hx    ??? Dislocations Neg Hx    ??? Fibromyalgia Neg Hx    ??? Gout Neg Hx    ??? Hemophilia Neg Hx    ??? Osteoporosis Neg Hx    ??? Rheumatologic disease Neg Hx    ??? Scoliosis Neg Hx    ??? Severe sprains Neg Hx    ??? Sickle cell anemia Neg Hx    ??? Spinal Compression Fracture Neg Hx    ??? Melanoma Neg Hx    ??? Basal cell carcinoma Neg Hx    ??? Squamous cell carcinoma Neg Hx        REVIEW OF SYSTEMS:    10 system ROS is negative besides otherwise noted in HPI    PHYSICAL EXAMINATION:  Vitals:    05/06/19 1453   BP: 137/75   Pulse: 69   Resp: 16   Temp: 36.6 ??C (97.8 ??F)   SpO2: 99%     BMI Readings from Last 3 Encounters:   05/06/19 18.18 kg/m?? (8 %, Z= -1.43)*   03/31/19 18.00 kg/m?? (6 %, Z= -1.53)*   03/18/19 18.26 kg/m?? (8 %, Z= -1.38)*     * Growth percentiles are based on CDC (Girls, 2-20 Years) data.     Physical Exam   Constitutional: She is oriented to person, place, and time and well-developed, well-nourished, and in no distress.   HENT:   Head: Normocephalic and atraumatic.   Eyes: Left eye exhibits no discharge. No scleral icterus.   Neck: Normal range of motion. No thyromegaly present.   Cardiovascular: Normal rate, regular rhythm and normal heart sounds.   No murmur heard.  Pulmonary/Chest: Effort normal. No respiratory distress. She has no wheezes. She exhibits tenderness.   Tender at right anterior desmoid tumor 2-3 cm palpable, RSB above breast   Abdominal: Soft. She exhibits no distension. There is no abdominal tenderness.   Musculoskeletal: Normal range of motion.      Comments: Palpable desmoid tumors at sites listed in HPI. Tenderness with palpation of left axillary and right posterior subscapular desmoids.    Lymphadenopathy:     She has no cervical adenopathy.   Neurological: She is alert and oriented to person, place, and time. Gait normal. Coordination normal.   Skin: Skin is warm. No rash noted. No erythema.   Psychiatric: Mood, memory, affect and judgment normal.     Diagnostic Studies:  10/15/18 CT Chest/ABD  IMPRESSION:  - [name] in the left anterior chest wall has decreased in size with other lesions in the left posterior paraspinal region and posterior to the right fourth and fifth ribs, unchanged.  - Additional lesion at the right anterior chest wall is better visualized on today's examination than on prior exams but is similar in size in retrospect.  - No evidence of neoplasia in the abdomen.    Assessment:  [name] [name] is 19 y.o. female who has been diagnosed with 2 new desmoid tumors in the setting of [name].   ?? Pt here to establish care in adult surgical clinic.  ?? Discussed the depth and location of the new tumors, right chest wall and right posterior trunk. Symptoms of pain and concern for regional growth lead to discussions for surgical resection.   ?? Imaging is from august and planned to be  repeated at follow up appt with Dr Ciro Backer on 4/2 to assess response to sorafenib, will plan to have pt return to surgical oncology clinic same day   ?? Pt relayed that ideal time for surgical intervention would be during summer between semesters.     Plan:  ?? RTC on 06/10/19 after interval imaging to discuss treatment response and potential surgical intervention

## 2019-05-11 DIAGNOSIS — R002 Palpitations: Secondary | ICD-10-CM | POA: Diagnosis not present

## 2019-05-12 ENCOUNTER — Encounter: Admit: 2019-05-12 | Discharge: 2019-05-13 | Payer: PRIVATE HEALTH INSURANCE

## 2019-05-12 DIAGNOSIS — R55 Syncope and collapse: Principal | ICD-10-CM

## 2019-05-12 DIAGNOSIS — R001 Bradycardia, unspecified: Principal | ICD-10-CM

## 2019-05-12 DIAGNOSIS — D481 Neoplasm of uncertain behavior of connective and other soft tissue: Principal | ICD-10-CM

## 2019-05-12 DIAGNOSIS — I158 Other secondary hypertension: Principal | ICD-10-CM

## 2019-05-12 DIAGNOSIS — I10 Essential (primary) hypertension: Principal | ICD-10-CM

## 2019-05-12 NOTE — Unmapped (Signed)
I spent 30 minutes on the real-time audio and video with the patient on the date of service. I spent an additional 15 minutes on pre- and post-visit activities on the date of service.     The patient was physically located in [place] or a state in which I am permitted to provide care. The patient and/or parent/guardian understood that s/he may incur co-pays and cost sharing, and agreed to the telemedicine visit. The visit was reasonable and appropriate under the circumstances given the patient's presentation at the time.    The patient and/or parent/guardian has been advised of the potential risks and limitations of this mode of treatment (including, but not limited to, the absence of in-person examination) and has agreed to be treated using telemedicine. The patient's/patient's family's questions regarding telemedicine have been answered.     If the visit was completed in an ambulatory setting, the patient and/or parent/guardian has also been advised to contact their provider???s office for worsening conditions, and seek emergency medical treatment and/or call 911 if the patient deems either necessary.    [place] INTERVENTIONAL RADIOLOGY - FOLLOW UP VISIT    Reason for visit: Scheduled/established patient.    Subjective:     CC: Right forearm skin denting    History of Present Illness: HPI: [name] [name] is a 19 y.o. female with a history of Gardener syndrome, multifocal desmoid tumors, presents to the clinic today for follow-up.     Interval History:  She underwent cryoablation of symptomatic desmoid tumors in the left anterior superior chest wall and steroid injection in the inoperable desmoid tumor in the right right forearm in June 2020. She started Sorafenib by Dr. Debbe Mounts for desmoid tumor treatment. She had COVID infection in November 2020. She developed alopecia, labile heart rates (30-150), syncope. She noticed some of the desmoid tumors shrinking, especially in the right forearm and lateral calf. She is currently having ambulatory EKG monitoring for the work up of labile heart rates. She also noticed left forearm skin denting on the area of desmoid tumor and she thinks the soft tissue under the skin is getting thinner. There is skin whiteness color change around the desmoid tumor. She had transient depigmentation on the area of steroid injection in June 2020, which subsided spontaneously, but now it recurred. She has no significant pain from the desmoid tumors. She continues to have right cramp when she uses right hand for writing, however it has been stable last 10 years.  She visited Dr. Donalynn Furlong' office for surgical treatment consult of desmoid tumors. Dr. Ciro Backer reduced the Sorafenib dose to half since she developed side effects. She is in college in [place], and returns to [facility] for spring break in early April. She is scheduled for MRI, clinic visit to Dr. Ciro Backer and Dr. Lenise Arena on April 2.     Past Medical/Surgical History:  Past Medical History:   Diagnosis Date   ??? [name]    ??? Disease of thyroid gland     multiple sub-5 mm cystic foci most compatible with colloid cysts, per Thyroid US   ??? [name] (familial adenomatous polyposis)    ??? [name]    ??? Intestinal polyps    ??? Vision problems        Past Surgical History:   Procedure Laterality Date   ??? cystis removal     ??? desmoid removal     ??? PR COLONOSCOPY W/BIOPSY SINGLE/MULTIPLE N/A 10/27/2012    Procedure: COLONOSCOPY, FLEXIBLE, PROXIMAL TO SPLENIC  FLEXURE; WITH BIOPSY, SINGLE OR MULTIPLE;  Surgeon: Shirlyn Goltz Mir, MD;  Location: PEDS PROCEDURE ROOM [facility];  Service: Gastroenterology   ??? PR COLONOSCOPY W/BIOPSY SINGLE/MULTIPLE N/A 09/14/2013    Procedure: COLONOSCOPY, FLEXIBLE, PROXIMAL TO SPLENIC FLEXURE; WITH BIOPSY, SINGLE OR MULTIPLE;  Surgeon: Shirlyn Goltz Mir, MD;  Location: PEDS PROCEDURE ROOM [facility];  Service: Gastroenterology   ??? PR COLONOSCOPY W/BIOPSY SINGLE/MULTIPLE N/A 11/08/2014    Procedure: COLONOSCOPY, FLEXIBLE, PROXIMAL TO SPLENIC FLEXURE; WITH BIOPSY, SINGLE OR MULTIPLE;  Surgeon: Arnold Long Mir, MD;  Location: PEDS PROCEDURE ROOM [facility];  Service: Gastroenterology   ??? PR COLONOSCOPY W/BIOPSY SINGLE/MULTIPLE N/A 12/26/2015    Procedure: COLONOSCOPY, FLEXIBLE, PROXIMAL TO SPLENIC FLEXURE; WITH BIOPSY, SINGLE OR MULTIPLE;  Surgeon: Arnold Long Mir, MD;  Location: PEDS PROCEDURE ROOM [facility];  Service: Gastroenterology   ??? PR COLONOSCOPY W/BIOPSY SINGLE/MULTIPLE N/A 09/02/2017    Procedure: COLONOSCOPY, FLEXIBLE, PROXIMAL TO SPLENIC FLEXURE; WITH BIOPSY, SINGLE OR MULTIPLE;  Surgeon: Arnold Long Mir, MD;  Location: PEDS PROCEDURE ROOM [facility];  Service: Gastroenterology   ??? PR COLSC FLX W/REMOVAL LESION BY HOT BX FORCEPS N/A 08/27/2016    Procedure: COLONOSCOPY, FLEXIBLE, PROXIMAL TO SPLENIC FLEXURE; W/REMOVAL TUMOR/POLYP/OTHER LESION, HOT BX FORCEP/CAUTE;  Surgeon: Arnold Long Mir, MD;  Location: PEDS PROCEDURE ROOM [facility];  Service: Gastroenterology   ??? PR COLSC FLX W/RMVL OF TUMOR POLYP LESION SNARE TQ N/A 02/25/2019    Procedure: COLONOSCOPY FLEX; W/REMOV TUMOR/LES BY SNARE;  Surgeon: Helyn Numbers, MD;  Location: [facility] PROCEDURES [facility];  Service: Gastroenterology   ??? PR EXC SKIN BENIG 2.1-3 CM TRUNK,ARM,LEG Right 02/25/2017    Procedure: EXCISION, BENIGN LESION INCLUDE MARGINS, EXCEPT SKIN TAG, LEGS; EXCISED DIAMETER 2.1 TO 3.0 CM;  Surgeon: Clarene Duke, MD;  Location: CHILDRENS OR [facility];  Service: Plastics   ??? PR EXC SKIN BENIG 3.1-4 CM TRUNK,ARM,LEG Right 02/25/2017    Procedure: EXCISION, BENIGN LESION INCLUDE MARGINS, EXCEPT SKIN TAG, ARMS; EXCISED DIAMETER 3.1 TO 4.0 CM;  Surgeon: Clarene Duke, MD;  Location: CHILDRENS OR [facility];  Service: Plastics   ??? PR EXC SKIN BENIG >4 CM FACE,FACIAL Right 02/25/2017    Procedure: EXCISION, OTHER BENIGN LES INCLUD MARGIN, FACE/EARS/EYELIDS/NOSE/LIPS/MUCOUS MEMBRANE; EXCISED DIAM >4.0 CM;  Surgeon: Clarene Duke, MD;  Location: CHILDRENS OR [facility];  Service: Plastics   ??? PR UPPER GI ENDOSCOPY,BIOPSY N/A 10/27/2012    Procedure: UGI ENDOSCOPY; WITH BIOPSY, SINGLE OR MULTIPLE;  Surgeon: Shirlyn Goltz Mir, MD;  Location: PEDS PROCEDURE ROOM [facility];  Service: Gastroenterology   ??? PR UPPER GI ENDOSCOPY,BIOPSY N/A 09/14/2013    Procedure: UGI ENDOSCOPY; WITH BIOPSY, SINGLE OR MULTIPLE;  Surgeon: Shirlyn Goltz Mir, MD;  Location: PEDS PROCEDURE ROOM [facility];  Service: Gastroenterology   ??? PR UPPER GI ENDOSCOPY,BIOPSY N/A 11/08/2014    Procedure: UGI ENDOSCOPY; WITH BIOPSY, SINGLE OR MULTIPLE;  Surgeon: Arnold Long Mir, MD;  Location: PEDS PROCEDURE ROOM [facility];  Service: Gastroenterology   ??? PR UPPER GI ENDOSCOPY,BIOPSY N/A 12/26/2015    Procedure: UGI ENDOSCOPY; WITH BIOPSY, SINGLE OR MULTIPLE;  Surgeon: Arnold Long Mir, MD;  Location: PEDS PROCEDURE ROOM [facility];  Service: Gastroenterology   ??? PR UPPER GI ENDOSCOPY,BIOPSY N/A 08/27/2016    Procedure: UGI ENDOSCOPY; WITH BIOPSY, SINGLE OR MULTIPLE;  Surgeon: Arnold Long Mir, MD;  Location: PEDS PROCEDURE ROOM [facility];  Service: Gastroenterology   ??? PR UPPER GI ENDOSCOPY,BIOPSY N/A 09/02/2017    Procedure: UGI ENDOSCOPY; WITH BIOPSY, SINGLE OR MULTIPLE;  Surgeon: Arnold Long Mir, MD;  Location: PEDS PROCEDURE ROOM  [facility];  Service: Gastroenterology   ??? TUMOR REMOVAL         Family History:  Patient family history includes No Known Problems in her brother, father, maternal aunt, maternal grandfather, maternal uncle, mother, paternal aunt, paternal grandfather, paternal grandmother, paternal uncle, and sister; Other in her maternal grandmother; Stroke in her maternal grandmother.    Social History:    Social History     Socioeconomic History   ??? Marital status: Single     Spouse name: Not on file   ??? Number of children: Not on file   ??? Years of education: Not on file   ??? Highest education level: Not on file   Occupational History   ??? Not on file   Social Needs   ??? Financial resource strain: Not on file   ??? Food insecurity     Worry: Not on file     Inability: Not on file   ??? Transportation needs     Medical: Not on file     Non-medical: Not on file   Tobacco Use   ??? Smoking status: Never Smoker   ??? Smokeless tobacco: Never Used   Substance and Sexual Activity   ??? Alcohol use: No   ??? Drug use: No   ??? Sexual activity: Never   Lifestyle   ??? Physical activity     Days per week: Not on file     Minutes per session: Not on file   ??? Stress: Not on file   Relationships   ??? Social Wellsite geologist on phone: Not on file     Gets together: Not on file     Attends religious service: Not on file     Active member of club or organization: Not on file     Attends meetings of clubs or organizations: Not on file     Relationship status: Not on file   Other Topics Concern   ??? Do you use sunscreen? Yes   ??? Tanning bed use? No   ??? Are you easily burned? No   ??? Excessive sun exposure? No   ??? Blistering sunburns? Yes   Social History Narrative    [name] [name] lives at home with parents and 1 sister. Dog at home She does well in school.  Just finished high school and will be going to [employer] in fall 2019 to pursue either nursing or PA school.       Allergies:  Allergies   Allergen Reactions   ??? Neomycin Swelling     Rxn after ear drops; ear swelling       Medications:    Current Outpatient Medications:   ???  acetaminophen (TYLENOL) 500 MG tablet, Take by mouth., Disp: , Rfl:   ???  clobetasoL (TEMOVATE) 0.05 % external solution, Apply topically Two (2) times a day. To the scalp as needed for itching, redness. (Patient not taking: Reported on 03/30/2019), Disp: 50 mL, Rfl: 5  ???  clobetasoL (TEMOVATE) 0.05 % ointment, Apply topically Two (2) times a day. To the hands and feet when itching, scaling. Use for 1-2 weeks, then stop. Repeat as needed., Disp: 60 g, Rfl: 3  ???  famotidine (PEPCID) 40 MG tablet, Take 40 mg by mouth daily., Disp: , Rfl:   ???  fexofenadine (ALLEGRA) 180 MG tablet, Take one pill twice daily, Disp: 100 tablet, Rfl: 3  ???  ondansetron (ZOFRAN) 8 MG tablet, Take 1 tablet (8 mg total) by mouth every  twelve (12) hours as needed for nausea., Disp: 60 tablet, Rfl: 2  ???  SORAfenib (NEXAVAR) 200 mg tablet, Take 1 tablet (200 mg total) by mouth daily., Disp: 30 tablet, Rfl: 2  ???  tacrolimus (PROTOPIC) 0.1 % ointment, Apply 1 application topically Two (2) times a day. To rash on face, Disp: 30 g, Rfl: 11  ???  triamcinolone (KENALOG) 0.1 % ointment, Apply topically Two (2) times a day. To dry, scaly areas on the body for 1-2 weeks, then stop. Repeat as needed., Disp: 453 g, Rfl: 4    Review of Systems:  Complete ROS performed. All systems negative except as detailed in HPI or below.     Objective:     No physical exam was performed because it was a virtual visit.     Pertinent Laboratory Values:     Lab Results   Component Value Date    WBC 6.6 02/22/2019    HGB 14.3 02/22/2019    HCT 41.9 02/22/2019    PLT 152 02/22/2019       Lab Results   Component Value Date    NA 141 02/22/2019    K 4.3 02/22/2019    CL 107 02/22/2019    CO2 25.0 02/22/2019    BUN 11 02/22/2019    CREATININE 0.78 02/22/2019    GLU 83 02/22/2019    CALCIUM 9.5 02/22/2019    MG 2.2 07/15/2011    PHOS 5.0 07/01/2013       Lab Results   Component Value Date    BILITOT 0.6 02/22/2019    BILIDIR <0.1 10/31/2011    PROT 7.4 02/22/2019    ALBUMIN 4.3 02/22/2019    ALT 9 02/22/2019    AST 23 02/22/2019    ALKPHOS 64 02/22/2019    GGT 13 10/31/2011       Lab Results   Component Value Date    INR 1.06 10/11/2018    APTT 31.8 10/11/2018         Imaging Source: I independently reviewed the MRI image from 10/15/2018 and I agree with the findings/interpretation.  [name] in the left anterior chest wall has decreased in size with other lesions in the left posterior paraspinal region and posterior to the right fourth and fifth ribs, unchanged.  Additional lesion at the right anterior chest wall is better visualized on today's examination than on prior exams but is similar in size in retrospect.      Assessment/Plan (Medical Decision Making): This is an 19 y.o. female with [name] and multifocal desmoid tumors. She underwent multiple surgical removals of desmoid tumors, steroid injections, and cryoablation procedures. Her most recent cryoablation and steroid injection was June 2020. She has been on Sorafenib since October 20201, and she had COVID infection in November 2020. She has several concerning signs such as labile HR, syncope, hair loss, skin thinning and whitish color change on the area of right forearm desmoid tumor. She is currently under Holter monitoring. Her Sorafenib dosing was reduced to half since she developed side effects. She noticed desmoid tumors shrinking, especially right forearm and lateral calf desmoid tumors. No significant pain from the desmoid tumors. Stable right hand cramp symptoms.     Plan: Touch base with Dr. Debbe Mounts regarding Sorafenib treatment.  Return to VIR, Ped Hematology, and Surgical oncology clinic, same day MRI on 06/10/2019.  Ultrasound exam will be performed in VIR clinic to reassess the desmoid tumor in the right forearm.     I  personally spent 30 minutes face-to-face with the patient and greater than 50% of that time was spent in counseling and coordinating care with the patient regarding the patient's desmoid tumor treatment.

## 2019-05-12 NOTE — Unmapped (Signed)
Asked me to order echo in addn to referral and cards will see tomorrow. I texted K to let her know and cardiology will call today. No more siro in case that is culprit which is possible but not that likely to my mind, though had syncope a few wks into starting the drug. If adm can go to adult or peds

## 2019-05-12 NOTE — Unmapped (Signed)
I called and spoke to mom then called [facility] and left message, after Dr Selena Batten updated me. hbp 150/110 per mom and HR low with syncope now on Holter. Chronic hair and skin issues certainly due to sorafenib and I had suggested that K stop med before but she wanted to try some more. HBP does occur but other cardiac side effects are rare. K also had covid in November. My suggestion is to stop the sorafenib now, to be seen by cardiology if they are not the ones following in [place]. I can put in referral but other quicker option might be to go thru ER or to get pcp to make referral. I will ask K to let me know her preference. Coming to see [name] (dr Selena Batten and me) 4/2 I believe

## 2019-05-13 ENCOUNTER — Ambulatory Visit: Admit: 2019-05-13 | Discharge: 2019-05-14 | Payer: PRIVATE HEALTH INSURANCE

## 2019-05-13 ENCOUNTER — Encounter
Admit: 2019-05-13 | Discharge: 2019-05-14 | Payer: PRIVATE HEALTH INSURANCE | Attending: Internal Medicine | Primary: Internal Medicine

## 2019-05-13 DIAGNOSIS — R55 Syncope and collapse: Principal | ICD-10-CM

## 2019-05-13 DIAGNOSIS — I158 Other secondary hypertension: Principal | ICD-10-CM

## 2019-05-13 DIAGNOSIS — R001 Bradycardia, unspecified: Principal | ICD-10-CM

## 2019-05-13 DIAGNOSIS — I10 Essential (primary) hypertension: Secondary | ICD-10-CM | POA: Diagnosis not present

## 2019-05-13 DIAGNOSIS — R110 Nausea: Secondary | ICD-10-CM | POA: Diagnosis not present

## 2019-05-13 DIAGNOSIS — T451X5A Adverse effect of antineoplastic and immunosuppressive drugs, initial encounter: Secondary | ICD-10-CM | POA: Diagnosis not present

## 2019-05-13 DIAGNOSIS — Q8789 Other specified congenital malformation syndromes, not elsewhere classified: Secondary | ICD-10-CM | POA: Diagnosis not present

## 2019-05-13 NOTE — Unmapped (Addendum)
Your echocardiogram is reassuring, the final results will be sent to you.      Over the next few weeks please try to increase your water intake on a daily basis.     Do your best to keep a daily journal of how you are feeling and when you have these episodes and what you have eaten or had to drink that day.      We will see back in clinic in 6 to 8 weeks to review the Zio patch results and make sure you are improving.

## 2019-05-13 NOTE — Unmapped (Signed)
.  Cardiology Outpatient Visit    Visit Date: 05/13/19    Requesting Provider: Benita Stabile, MD   Primary Provider: Barnet Glasgow, MD     Reason for Referral:   Syncope     Assessment & Plan: [name] is a 19 y.o. 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    Syncope/presyncope: Unclear etiology with many confounding factors including dehydration and poor PO intake leading to orthostatic hypotension, ongoing chemotherapy with significant side effect profile, arrhythmia (currently has ziopatch on), underlying malignancy as well as many others. Reassurringly her echo is unremarkable and recent ER visit did not show any sinister etiology immediately after most recent episode in [place] while she was at school. Certainly has vagal prodrome often initiated by nausea, abdominal pain, sweating and lightheadedness followed by controlled collapse as opposed to syncope resulting in bodily harm. She has not sustained any injuries from episodes in the past and has warning as described.   - Ziopatch results to be sent to our clinic  - Close follow up  - Encourage adequate PO intake and hydration     Return in about 6 weeks (around 06/24/2019).    Patient was staffed with Dr. Jon Billings    History of Present Illness:  [name] is a 19 y.o. 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    Patient describes several episodes of feeling lightheaded after onset of nausea resulting in passing out.  She denies ever losing complete consciousness and is aware of her surroundings, no witnessed or observed seizure-like activity including no loss of bowel or bladder continence, postictal confusion, reported tonic clonic movements or tongue biting.  She describes these episodes as feeling as though she collapses. These do not seem to be associated with exercise or activity and not always with standing up but she does feel lightheaded often when she stands from a seated position too quickly.  She feels dehydrated most of the time and drinks about 1 to 2 glasses of water per day.  When she is with her parents she tends to eat more like today she had barbecue and a hearty breakfast.  She has been taking sarofib for several months now has been stopped and restarted by her primary oncologist given concern for other toxic side effects of this medication.  A prior episode where she felt dehydrated and had 1 of these presyncopal events resulted in presentation to student health where she was given IV fluids and felt considerably better afterwards.  Reportedly her blood pressure at that time was 140/115 mmHg and she was having tremulousness but this was not deemed to be seizure-like activity according to the ER doctors notes from [place].  After that she was detoxed from the medication and well initially felt worse for a variety of reasons then began to feel better with fewer episodes.  She describes one of the worst episodes of this occurring with her mother after a colonoscopy where she lost consciousness according to her mother but was quickly regained.  At that time it was thought to be related to sedation from her procedure as well as fluid shifts and dehydration from the prep for her colonoscopy.  She is able to work out on a regular and stairstepper and denies feeling lightheaded or dizzy or not unable  to continue due to any of these symptoms.  Denies any chest pain,, shortness of breath at rest or with exertion, fever, cough, diarrhea.  She does have ongoing nausea from her medications but very seldomly vomits.    Her chemotherapy was stopped given concern for toxic side effects of this medication until she was cleared by cardiology.  She underwent a transthoracic echocardiogram at the beginning of clinic today which looks unremarkable at first glance.    Past Cardiac History:    EP Procedures and Devices:  Currently undergoing evaluation with Zio patch which was placed recently by the emergency room in [facility]    Non-Invasive Evaluation(s):  ?? Echocardiogram: Preliminarily appears unremarkable with normal RV and LV systolic and diastolic function.  No pericardial effusion.  No significant valvular abnormalities.  Formal result pending    ?? ECG  05/13/19 NSR with sinus arrhythmia.       Past Medical History:   Diagnosis Date   ??? [name]    ??? Disease of thyroid gland     multiple sub-5 mm cystic foci most compatible with colloid cysts, per Thyroid US   ??? [name] (familial adenomatous polyposis)    ??? [name]    ??? Intestinal polyps    ??? Vision problems        Past Surgical History:  Past Surgical History:   Procedure Laterality Date   ??? cystis removal     ??? desmoid removal     ??? PR COLONOSCOPY W/BIOPSY SINGLE/MULTIPLE N/A 10/27/2012    Procedure: COLONOSCOPY, FLEXIBLE, PROXIMAL TO SPLENIC FLEXURE; WITH BIOPSY, SINGLE OR MULTIPLE;  Surgeon: Shirlyn Goltz Mir, MD;  Location: PEDS PROCEDURE ROOM [facility];  Service: Gastroenterology   ??? PR COLONOSCOPY W/BIOPSY SINGLE/MULTIPLE N/A 09/14/2013    Procedure: COLONOSCOPY, FLEXIBLE, PROXIMAL TO SPLENIC FLEXURE; WITH BIOPSY, SINGLE OR MULTIPLE;  Surgeon: Shirlyn Goltz Mir, MD;  Location: PEDS PROCEDURE ROOM [facility];  Service: Gastroenterology   ??? PR COLONOSCOPY W/BIOPSY SINGLE/MULTIPLE N/A 11/08/2014    Procedure: COLONOSCOPY, FLEXIBLE, PROXIMAL TO SPLENIC FLEXURE; WITH BIOPSY, SINGLE OR MULTIPLE;  Surgeon: Arnold Long Mir, MD;  Location: PEDS PROCEDURE ROOM [facility];  Service: Gastroenterology   ??? PR COLONOSCOPY W/BIOPSY SINGLE/MULTIPLE N/A 12/26/2015    Procedure: COLONOSCOPY, FLEXIBLE, PROXIMAL TO SPLENIC FLEXURE; WITH BIOPSY, SINGLE OR MULTIPLE;  Surgeon: Arnold Long Mir, MD;  Location: PEDS PROCEDURE ROOM [facility];  Service: Gastroenterology   ??? PR COLONOSCOPY W/BIOPSY SINGLE/MULTIPLE N/A 09/02/2017 Procedure: COLONOSCOPY, FLEXIBLE, PROXIMAL TO SPLENIC FLEXURE; WITH BIOPSY, SINGLE OR MULTIPLE;  Surgeon: Arnold Long Mir, MD;  Location: PEDS PROCEDURE ROOM [facility];  Service: Gastroenterology   ??? PR COLSC FLX W/REMOVAL LESION BY HOT BX FORCEPS N/A 08/27/2016    Procedure: COLONOSCOPY, FLEXIBLE, PROXIMAL TO SPLENIC FLEXURE; W/REMOVAL TUMOR/POLYP/OTHER LESION, HOT BX FORCEP/CAUTE;  Surgeon: Arnold Long Mir, MD;  Location: PEDS PROCEDURE ROOM [facility];  Service: Gastroenterology   ??? PR COLSC FLX W/RMVL OF TUMOR POLYP LESION SNARE TQ N/A 02/25/2019    Procedure: COLONOSCOPY FLEX; W/REMOV TUMOR/LES BY SNARE;  Surgeon: Helyn Numbers, MD;  Location: [facility] PROCEDURES [facility];  Service: Gastroenterology   ??? PR EXC SKIN BENIG 2.1-3 CM TRUNK,ARM,LEG Right 02/25/2017    Procedure: EXCISION, BENIGN LESION INCLUDE MARGINS, EXCEPT SKIN TAG, LEGS; EXCISED DIAMETER 2.1 TO 3.0 CM;  Surgeon: Clarene Duke, MD;  Location: CHILDRENS OR [facility];  Service: Plastics   ??? PR EXC SKIN BENIG 3.1-4 CM TRUNK,ARM,LEG Right 02/25/2017    Procedure: EXCISION, BENIGN LESION INCLUDE MARGINS, EXCEPT SKIN TAG, ARMS;  EXCISED DIAMETER 3.1 TO 4.0 CM;  Surgeon: Clarene Duke, MD;  Location: CHILDRENS OR [facility];  Service: Plastics   ??? PR EXC SKIN BENIG >4 CM FACE,FACIAL Right 02/25/2017    Procedure: EXCISION, OTHER BENIGN LES INCLUD MARGIN, FACE/EARS/EYELIDS/NOSE/LIPS/MUCOUS MEMBRANE; EXCISED DIAM >4.0 CM;  Surgeon: Clarene Duke, MD;  Location: CHILDRENS OR [facility];  Service: Plastics   ??? PR UPPER GI ENDOSCOPY,BIOPSY N/A 10/27/2012    Procedure: UGI ENDOSCOPY; WITH BIOPSY, SINGLE OR MULTIPLE;  Surgeon: Shirlyn Goltz Mir, MD;  Location: PEDS PROCEDURE ROOM [facility];  Service: Gastroenterology   ??? PR UPPER GI ENDOSCOPY,BIOPSY N/A 09/14/2013    Procedure: UGI ENDOSCOPY; WITH BIOPSY, SINGLE OR MULTIPLE;  Surgeon: Shirlyn Goltz Mir, MD;  Location: PEDS PROCEDURE ROOM [facility];  Service: Gastroenterology   ??? PR UPPER GI ENDOSCOPY,BIOPSY N/A 11/08/2014    Procedure: UGI ENDOSCOPY; WITH BIOPSY, SINGLE OR MULTIPLE;  Surgeon: Arnold Long Mir, MD;  Location: PEDS PROCEDURE ROOM [facility];  Service: Gastroenterology   ??? PR UPPER GI ENDOSCOPY,BIOPSY N/A 12/26/2015    Procedure: UGI ENDOSCOPY; WITH BIOPSY, SINGLE OR MULTIPLE;  Surgeon: Arnold Long Mir, MD;  Location: PEDS PROCEDURE ROOM [facility];  Service: Gastroenterology   ??? PR UPPER GI ENDOSCOPY,BIOPSY N/A 08/27/2016    Procedure: UGI ENDOSCOPY; WITH BIOPSY, SINGLE OR MULTIPLE;  Surgeon: Arnold Long Mir, MD;  Location: PEDS PROCEDURE ROOM [facility];  Service: Gastroenterology   ??? PR UPPER GI ENDOSCOPY,BIOPSY N/A 09/02/2017    Procedure: UGI ENDOSCOPY; WITH BIOPSY, SINGLE OR MULTIPLE;  Surgeon: Arnold Long Mir, MD;  Location: PEDS PROCEDURE ROOM [facility];  Service: Gastroenterology   ??? TUMOR REMOVAL         Medications:  Current Outpatient Medications   Medication Sig Dispense Refill   ??? acetaminophen (TYLENOL) 500 MG tablet Take by mouth.     ??? clobetasoL (TEMOVATE) 0.05 % ointment Apply topically Two (2) times a day. To the hands and feet when itching, scaling. Use for 1-2 weeks, then stop. Repeat as needed. 60 g 3   ??? famotidine (PEPCID) 40 MG tablet Take 40 mg by mouth daily.     ??? fexofenadine (ALLEGRA) 180 MG tablet Take one pill twice daily 100 tablet 3   ??? ondansetron (ZOFRAN) 8 MG tablet Take 1 tablet (8 mg total) by mouth every twelve (12) hours as needed for nausea. 60 tablet 2   ??? tacrolimus (PROTOPIC) 0.1 % ointment Apply 1 application topically Two (2) times a day. To rash on face 30 g 11   ??? triamcinolone (KENALOG) 0.1 % ointment Apply topically Two (2) times a day. To dry, scaly areas on the body for 1-2 weeks, then stop. Repeat as needed. 453 g 4   ??? clobetasoL (TEMOVATE) 0.05 % external solution Apply topically Two (2) times a day. To the scalp as needed for itching, redness. (Patient not taking: Reported on 05/13/2019) 50 mL 5   ??? SORAfenib (NEXAVAR) 200 mg tablet Take 1 tablet (200 mg total) by mouth daily. 30 tablet 2     No current facility-administered medications for this visit.        Allergies:  Allergies   Allergen Reactions   ??? Neomycin Swelling     Rxn after ear drops; ear swelling       Social History:  Social History     Socioeconomic History   ??? Marital status: Single     Spouse name: Not on file   ??? Number of children: Not on file   ??? Years of education:  Not on file   ??? Highest education level: Not on file   Occupational History   ??? Not on file   Social Needs   ??? Financial resource strain: Not on file   ??? Food insecurity     Worry: Not on file     Inability: Not on file   ??? Transportation needs     Medical: Not on file     Non-medical: Not on file   Tobacco Use   ??? Smoking status: Never Smoker   ??? Smokeless tobacco: Never Used   Substance and Sexual Activity   ??? Alcohol use: No   ??? Drug use: No   ??? Sexual activity: Never   Lifestyle   ??? Physical activity     Days per week: Not on file     Minutes per session: Not on file   ??? Stress: Not on file   Relationships   ??? Social Wellsite geologist on phone: Not on file     Gets together: Not on file     Attends religious service: Not on file     Active member of club or organization: Not on file     Attends meetings of clubs or organizations: Not on file     Relationship status: Not on file   Other Topics Concern   ??? Do you use sunscreen? Yes   ??? Tanning bed use? No   ??? Are you easily burned? No   ??? Excessive sun exposure? No   ??? Blistering sunburns? Yes   Social History Narrative    Aniela lives at home with parents and 1 sister. Dog at home She does well in school.  Just finished high school and will be going to [employer] in fall 2019 to pursue either nursing or PA school.       Family History:  Family History   Problem Relation Age of Onset   ??? No Known Problems Mother    ??? No Known Problems Father    ??? No Known Problems Sister    ??? No Known Problems Brother    ??? No Known Problems Maternal Aunt    ??? No Known Problems Maternal Uncle    ??? No Known Problems Paternal Aunt    ??? No Known Problems Paternal Uncle    ??? Stroke Maternal Grandmother    ??? Other Maternal Grandmother         benign lesions of liver and pancreas, further details unknown   ??? No Known Problems Maternal Grandfather    ??? No Known Problems Paternal Grandmother    ??? No Known Problems Paternal Grandfather    ??? Anesthesia problems Neg Hx    ??? Broken bones Neg Hx    ??? Cancer Neg Hx    ??? Clotting disorder Neg Hx    ??? Collagen disease Neg Hx    ??? Diabetes Neg Hx    ??? Dislocations Neg Hx    ??? Fibromyalgia Neg Hx    ??? Gout Neg Hx    ??? Hemophilia Neg Hx    ??? Osteoporosis Neg Hx    ??? Rheumatologic disease Neg Hx    ??? Scoliosis Neg Hx    ??? Severe sprains Neg Hx    ??? Sickle cell anemia Neg Hx    ??? Spinal Compression Fracture Neg Hx    ??? Melanoma Neg Hx    ??? Basal cell carcinoma Neg Hx    ??? Squamous cell  carcinoma Neg Hx        Review of Systems:  A 12-system review of systems was performed and was negative except as noted in the HPI.    Physical Exam:  VITAL SIGNS:   Vitals:    05/13/19 1447   BP: 145/82   BP Site: L Arm   BP Position: Sitting   BP Cuff Size: Medium   Pulse: 79   Resp: 18   Temp: 36.7 ??C   TempSrc: Temporal   SpO2: 99%   Weight: 51.3 kg (113 lb)   Height: 165.1 cm (5' 5)     Body mass index is 18.8 kg/m??.  GENERAL: NAD. appears younger then stated age, thin  HEENT: PERRL, normal sclera.  NECK: Supple w/ normal ROM. No JVD  RESPIRATORY: CTA bilaterally without wheezes or crackles.  Normal WOB.  CARDIOVASCULAR: RRR without m/r/g.  ABDOMEN: Soft, NT/ND. +BS  EXTREMITIES:  No LE edema. Warm and well perfused. Pale skin.     Pertinent Test Results:    Lab Review   Lab Results   Component Value Date    Creatinine 0.78 02/22/2019    Creatinine 0.50 07/01/2013    BUN 11 02/22/2019    BUN 10 07/01/2013    Potassium 4.3 02/22/2019    Potassium 4.2 07/01/2013    Magnesium 2.2 07/15/2011    WBC 6.6 02/22/2019    WBC 6.8 06/13/2014    HGB 14.3 02/22/2019    HGB 14.8 06/13/2014    HCT 41.9 02/22/2019    HCT 43.8 06/13/2014 Platelet 152 02/22/2019    Platelet 221 06/13/2014    INR 1.06 10/11/2018    INR 1.02 04/23/2018    INR 1.1 10/31/2011        This note has been dictated using Dragon Medical voice recognition software and as such may contain typographical errors not appreciated during proofreading.

## 2019-05-14 DIAGNOSIS — R110 Nausea: Secondary | ICD-10-CM | POA: Insufficient documentation

## 2019-05-14 DIAGNOSIS — T451X5A Adverse effect of antineoplastic and immunosuppressive drugs, initial encounter: Secondary | ICD-10-CM | POA: Insufficient documentation

## 2019-05-14 DIAGNOSIS — R55 Syncope and collapse: Secondary | ICD-10-CM | POA: Insufficient documentation

## 2019-05-17 NOTE — Unmapped (Signed)
Addended by: Yaakov Guthrie A on: 05/17/2019 07:50 AM     Modules accepted: Level of Service

## 2019-05-17 NOTE — Unmapped (Signed)
Addendum created to file missing facility fee

## 2019-05-19 NOTE — Unmapped (Signed)
It was  medically necessary for me to see the patient in addition to the nurse practitioner. I discussed the findings, assessment and plan with the patient.      As noted, long history of subcutaneous desmoid tumors in the setting of [name].  Several relatively recently diagnosed lesions for which she has been on sorafenib.  Some response to this clinically.  A couple of these would be somewhat challenging from a surgical perspective given the anatomic location along the chest wall in a submuscular location and deep to the latissimus associated with the scapula.  But it certainly may be possible to resect these.    We are going to see her again in early April time for next imaging along with the medical oncology team and the interventional radiology team who has treated her in the past.  Based on those results we will consider appropriate neck steps.    Everitt Amber. Lenise Arena, MD  [facility]  [address]  [address]  [place], [place]  [address]  Phone: [phone]  FAX: [phone]

## 2019-05-27 NOTE — Unmapped (Signed)
06/10/2019    Followup  for??44????year-old white female with [name] ([name]), multiple desmoids??(post????cryotherapy #3??by Dr. Selena Batten of [facility] 08/2018??and steroid injections to arm several months ago), epidermal cysts, and GI polyps,??last seen by us??02/2019 and here for chest mri, visits with dr Selena Batten, dr Izola Price of surg,  and cardiology. Recheck after stopping sorafenib 05/12/19 due to significant toxicity. Seen at my request by cardiology (Dr Jon Billings) earlier in month with syncope which they attributed to nausea, dehydration with nl  Ekg (though had had mild bradycardia on sevl occasions); echo quality unclear but probably ok--see report. Also significant hair loss and derm toxicity though managed by Dr Kateri Mc, and fatigue, all slowly resolving prolbems. Despite some subtle shrinkage of visible desmoids on sorafenib and cut back to 100mg  a day I elected to stop sorafenib especially since most of management has been long distance while K is in college in [place]. Chest pain at site of one small right ant lesion, growth in rLE lesion x2.   K and boyfriend both were covid + and some mild respiratory sx in Nov at which time I had held sorafenib and resumed after phone call follow up in ?jan. Of note, lesions and surrounding skin seem to get itchy off sorafenib. Colonoscopy 02/2020 with many benign polyps not on sulindac. No more syncope, eating fair on zofran with good nausea control. Menses resumed  ??  Boyfriend, not having intercourse, no ocp. No smoking or drugs  ??  No longer??on??sulindac .?? She has had multiple mass excisions most recently by Dr. Lucretia Roers of plastic??surgery??02/2017 with inclusion cysts and osteomas,no malignancy;??jaw pits, which are being followed by dental, several removed over past yr, some delayed dentition--no canines. Wearing ??invisilon no other hardward . Has had thyroid nodules with low tsh, low free T4 last tested a year ago being follwed by endo, Dr. Timmothy Euler??though with NCCN recs of annual exam no [name].??Last scans 10/2018.??  ????  PMH+??multiple new desmoids; numbness of both arms intermittently; recent right arm sprain ??with fall and in brace for another 2 wks. Light but constant periods for 3 wks. Other issues ankles, knees hurt??w??good response of wrist to intralesional steroids??no longer an issue/  ??  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  Denies sex, drugs, etoh. Going back to college in [place]??end dec. ??  ??  REVIEW OF SYSTEMS: Times 12 is otherwise unchanged.   ??  FAMILY HISTORY AND SOCIAL HISTORY: see above    Meds: zofran topicals  ??  PHYSICAL EXAMINATION:??last??Wt   50.4 kg (111 lb 1.6 oz) Temp 36.8 ??C (98.2 ??F) (Oral)  - Resp 18  - Ht 166.5 cm (5' 5.55)  - Wt 50.7 kg (111 lb 12.8 oz)  - SpO2 98%  - BMI 18.29 kg/m??   Looks scrawny as always but wt up a bit     ??mask on and with recent hx I did not do oral exam  GENERAL:??hair looks normal not particularly thinSKIN: Shows some nevi, one on the right lower back no change from last visit??dry skin on extremities improved off sorafenib  ??HEENT: ??deferred ??NECK: Supple.Thyroid??nl exam  CHEST: Clear.??I still cant feel right upper ant chest lesion that K says she feels, though this was present on past scans??some redness of skin left axillary line where itching BREASTS:??deferred--CARDIAC: clear??regular rr no murmurs??????????BACK: Notable for her old surgical scars.??Again, I dont feel mass where K says she feels one under scar????well healed??Abdomen--nontender no HS????GENITOURINARY: deferred  EXTREMITIES??1.5 cm softer  nontender lesion r tibia a  bit bigger than beforeand new 1/4cm medial tibial lesion K feels others which I cant be sure of on extrem   ????NEUROLOGIC:color sensation nl in lue-  Nonfocal. Mostly right handed??Karnofsky??90%.  ??  MEDICAL DECISION MAKING:??preg test though post mri ??cbc chems??will regroup with drs kim, sandler, and surg. Mom showed me article which I had missed re recent erlotinib +sulinda c in Gardners and I will review that ASSESSMENT:  1. Familial adenomatous polyposis.??Risk benefit fr sorafenib not good and will reassess cryo, surgery, possibly sulindac/erlotinib. Glad to consider vcr methotrexate but only ~30% chance of response. GI f/u.??Genetics counseling if pregnancy contemplated down the line.seeing dr calikoglu of??[facility] (may need adult transition).??dental monitoring is ongoing.I would consider??foundation one??though may not find anything--tissue from 2004 bx may or may not still be available--will hold for now.  ????  2. Colonic and gastric polyps secondary to??fap and I dont know if these will respond in parallel with desmoids  ??  3. Hair loss-seen by dr Kateri Mc of??derm??and will get fu appt though doing better with occ creams4. Abn thyroid tests--f/u with endo??--  5. Dental issues????See above????neds f/u visit. Finances and coding seeem to be an issue but mom needs to address this  5. Irregular periods.??period last month; nl coags in past. May be wt related but getting a bit better. May need ocp  6. h/o covid  7. Well care--for now per routine. Off sorafenib--if she goes on other could consider holding live vaccine   [facility] for covid shot. Will order periactin for wt    rtc may when summer break    T=1hr coord care

## 2019-05-30 ENCOUNTER — Ambulatory Visit: Payer: Self-pay | Admitting: Physician Assistant

## 2019-06-03 DIAGNOSIS — R002 Palpitations: Secondary | ICD-10-CM | POA: Diagnosis not present

## 2019-06-09 NOTE — Unmapped (Signed)
Phone call to complete COVID questions, no answer, VM left to return call prior to appt.

## 2019-06-10 ENCOUNTER — Encounter: Admit: 2019-06-10 | Discharge: 2019-06-10 | Payer: PRIVATE HEALTH INSURANCE | Attending: Surgery | Primary: Surgery

## 2019-06-10 ENCOUNTER — Encounter: Admit: 2019-06-10 | Discharge: 2019-06-10 | Payer: PRIVATE HEALTH INSURANCE

## 2019-06-10 ENCOUNTER — Encounter
Admit: 2019-06-10 | Discharge: 2019-06-10 | Payer: PRIVATE HEALTH INSURANCE | Attending: Pediatric Hematology-Oncology | Primary: Pediatric Hematology-Oncology

## 2019-06-10 DIAGNOSIS — D485 Neoplasm of uncertain behavior of skin: Principal | ICD-10-CM

## 2019-06-10 DIAGNOSIS — D235 Other benign neoplasm of skin of trunk: Principal | ICD-10-CM

## 2019-06-10 DIAGNOSIS — Q8789 Other specified congenital malformation syndromes, not elsewhere classified: Secondary | ICD-10-CM | POA: Diagnosis not present

## 2019-06-10 DIAGNOSIS — R55 Syncope and collapse: Secondary | ICD-10-CM | POA: Diagnosis not present

## 2019-06-10 DIAGNOSIS — R222 Localized swelling, mass and lump, trunk: Secondary | ICD-10-CM | POA: Diagnosis not present

## 2019-06-10 DIAGNOSIS — M898X8 Other specified disorders of bone, other site: Secondary | ICD-10-CM | POA: Diagnosis not present

## 2019-06-10 DIAGNOSIS — I10 Essential (primary) hypertension: Secondary | ICD-10-CM | POA: Diagnosis not present

## 2019-06-10 DIAGNOSIS — K317 Polyp of stomach and duodenum: Secondary | ICD-10-CM | POA: Diagnosis not present

## 2019-06-10 DIAGNOSIS — D126 Benign neoplasm of colon, unspecified: Secondary | ICD-10-CM | POA: Diagnosis not present

## 2019-06-10 DIAGNOSIS — R630 Anorexia: Secondary | ICD-10-CM | POA: Diagnosis not present

## 2019-06-10 DIAGNOSIS — D369 Benign neoplasm, unspecified site: Secondary | ICD-10-CM | POA: Diagnosis not present

## 2019-06-10 DIAGNOSIS — R42 Dizziness and giddiness: Secondary | ICD-10-CM | POA: Diagnosis not present

## 2019-06-10 DIAGNOSIS — D481 Neoplasm of uncertain behavior of connective and other soft tissue: Secondary | ICD-10-CM | POA: Diagnosis not present

## 2019-06-10 LAB — CBC W/ AUTO DIFF
BASOPHILS ABSOLUTE COUNT: 0.1 10*9/L (ref 0.0–0.1)
BASOPHILS RELATIVE PERCENT: 0.9 %
EOSINOPHILS ABSOLUTE COUNT: 0.1 10*9/L (ref 0.0–0.4)
EOSINOPHILS RELATIVE PERCENT: 1.4 %
HEMATOCRIT: 42.3 % (ref 36.0–46.0)
HEMOGLOBIN: 13.7 g/dL (ref 12.0–16.0)
LARGE UNSTAINED CELLS: 2 % (ref 0–4)
LYMPHOCYTES ABSOLUTE COUNT: 1.8 10*9/L (ref 1.5–5.0)
LYMPHOCYTES RELATIVE PERCENT: 26.7 %
MEAN CORPUSCULAR HEMOGLOBIN CONC: 32.4 g/dL (ref 31.0–37.0)
MEAN CORPUSCULAR HEMOGLOBIN: 27.9 pg (ref 26.0–34.0)
MEAN CORPUSCULAR VOLUME: 86.2 fL (ref 80.0–100.0)
MEAN PLATELET VOLUME: 10.3 fL — ABNORMAL HIGH (ref 7.0–10.0)
MONOCYTES ABSOLUTE COUNT: 0.5 10*9/L (ref 0.2–0.8)
MONOCYTES RELATIVE PERCENT: 6.8 %
NEUTROPHILS ABSOLUTE COUNT: 4.2 10*9/L (ref 2.0–7.5)
NEUTROPHILS RELATIVE PERCENT: 62.3 %
PLATELET COUNT: 212 10*9/L (ref 150–440)
RED BLOOD CELL COUNT: 4.9 10*12/L (ref 4.00–5.20)

## 2019-06-10 LAB — COMPREHENSIVE METABOLIC PANEL
ALT (SGPT): 12 U/L (ref <35)
ANION GAP: 7 mmol/L (ref 7–15)
BLOOD UREA NITROGEN: 10 mg/dL (ref 7–21)
BUN / CREAT RATIO: 18
CALCIUM: 9.3 mg/dL (ref 8.5–10.2)
CHLORIDE: 107 mmol/L (ref 98–107)
CO2: 26 mmol/L (ref 22.0–30.0)
CREATININE: 0.57 mg/dL — ABNORMAL LOW (ref 0.60–1.00)
EGFR CKD-EPI AA FEMALE: >90 mL/min/{1.73_m2} (ref >=60)
GLUCOSE RANDOM: 90 mg/dL (ref 70–179)
SODIUM: 140 mmol/L (ref 135–145)

## 2019-06-10 LAB — MEAN CORPUSCULAR HEMOGLOBIN CONC: Erythrocyte mean corpuscular hemoglobin concentration:MCnc:Pt:RBC:Qn:Automated count: 32.4

## 2019-06-10 LAB — BLOOD UREA NITROGEN: Urea nitrogen:MCnc:Pt:Ser/Plas:Qn:: 10

## 2019-06-10 LAB — FERRITIN: Ferritin:MCnc:Pt:Ser/Plas:Qn:: 9.7

## 2019-06-10 LAB — BETA HCG QUANTITATIVE: Choriogonadotropin.beta subunit:ACnc:Pt:Ser/Plas:Qn:: <5

## 2019-06-10 MED ORDER — CYPROHEPTADINE 4 MG TABLET
ORAL_TABLET | Freq: Three times a day (TID) | ORAL | 1 refills | 30.00000 days | Status: CP
Start: 2019-06-10 — End: 2019-09-08

## 2019-06-10 NOTE — Unmapped (Signed)
FCC: 3 min pt education on labs for the visit with pt.

## 2019-06-10 NOTE — Unmapped (Signed)
Patient Name: [name] [name]  Patient Age: 19 y.o.  Encounter Date: 06/10/2019    Referring Physician:   Jacqualin Combes    Primary Care Provider:  Barnet Glasgow, MD    CONSULTING PHYSICIANS:  Patient Care Team:  Benita Stabile, MD as PCP - General  Debbe Mounts, MD as Consulting Physician (Peds: Pediatric Hematology-Oncology)  Cherlynn Perches, MD as Consulting Physician (Orthopedic Surgery)  Jobe Gibbon, MD as Consulting Physician (Interventional Radiology)  Twana First, MD as Consulting Physician  Berenice Bouton Anastasio Auerbach, RN as Registered Nurse (Oncology Navigator)  Camillo Flaming, MD as Attending Provider (Surgical Oncology)  Clarene Duke, MD as Consulting Physician (Plastic Surgery)  Helyn Numbers, MD as Consulting Physician (Gastroenterology)    Diagnosis:  [name]    Follow Up Note:    [name] [name] is a 19 y.o. female who is seen here for 2 newly diagnosed desmoid tumors of the trunk, in the setting of [name]. Pt currently has 8 known desmoid tumors, the two newest and most concerning tumors which prompted today's visit are 1. Anterior chest, right sternal border and 2. Right posterior trunk, midline below scapula, and posterior neck. (location of all currently know tumors listed below). These newest lesions are painful and less superficial than prior lesions. She recently discontinued sorafenib with Dr Ciro Backer (Oct 2020- present), d/t alopecia, labile HR (30-140's per pt), hypotension and syncope. 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 She maintains annual colonoscopies and endocrine evaluation for thyroid nodules. She is an otherwise active youth pursuing a degree in nursing at [employer]. She is accompanied today by her mother.   ??  Current Desmoids Tumors:  (2) Right forearm, deep unresectable -Dr Darral Dash  (1) RLE, superficial lateral calf -untreated  (2) Left anterior chest/ left axilla - treated w/ steroid & cryo - Dr Selena Batten, VIR (painful)  (1) Right anterior chest/RSB, between muscle and chest wall - untreated NEW (painful)  (1) Right posterior trunk, midline below scapula - untreated NEW (painful)  (1) Posterior neck, left of midline (~C6- T1) - untreated NEW    Vital Signs for this encounter:  BSA: 1.53 meters squared  BP 126/85  - Pulse 84  - Temp 36.8 ??C (98.3 ??F) (Temporal)  - Resp 16  - Ht 166.5 cm (5' 5.55)  - Wt 50.7 kg (111 lb 12.8 oz)  - SpO2 98%  - BMI 18.29 kg/m??       General Appearance:  No acute distress, well appearing and well nourished. A&0 x 3.   Pulmonary:    Normal respiratory effort.    Cardiovascular:  Regular rate and rhythm.   Abdomen:   Soft, NT/ND, no hernias, masses, or hepatosplenomegaly   Musculoskeletal: Normal gait.  Extremities without clubbing, cyanosis, or           edema.   Neurologic:  Lymphatic: No motor abnormalities noted.  Sensation grossly intact.  No cervical or supraclavicular LAD noted.     Diagnostic Studies:  Final report not available at time of office visit, prelim. imaging discussed with radiology by Dr Lenise Arena    06/10/19 MRI Chest  IMPRESSION:  --Redemonstrated multiple desmoid tumors involving the soft tissues of the anterior and posterior thoracic wall. These lesions are grossly unchanged in size given differences in measuring technique.   -- New lesion overlying  midline low thoracic spine.    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 Clinically symptomatic with mild manageable pain with pressure and movement.  ?? Reviewed imaging and discussed with patient and mother.Symptoms of pain were originally concerning for regional growth lead to discussions about surgical resection. However, most recent imaging suggests tumors remain mostly unchanged if not slightly reduced.  ?? At this point we recommend observation with interval imaging, d/t tumor locations and discrete characteristics we have concerns regarding resectability with clean margins and overall benefit to patient.   ?? Emphasized surgical resection risks including potential for impaired neuro muscular function and recurrence     Plan:   Favor medical management over surgical resection. We will coordinate with Dr Ciro Backer for surveillance and interval imaging.

## 2019-06-10 NOTE — Unmapped (Signed)
.  Cardiology Outpatient Visit    Visit Date: 06/10/19    Requesting Provider: Lenon Curt*   Primary Provider: Barnet Glasgow, MD     Reason for Referral:   Syncope    Assessment & Plan: [name] is a 19 y.o. 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    Syncope/presyncope: Unclear etiology with many confounding factors including dehydration and poor PO intake leading to orthostatic hypotension, ongoing chemotherapy with significant side effect profile, arrhythmia (currently has ziopatch on), underlying malignancy as well as many others. Reassurringly her echo is unremarkable and recent ER visit did not show any sinister etiology immediately after most recent episode in [place] while she was at school. Prodrome was consistent with vagal episodes. She wore a Zio which we obtained and scanned in during her clinic visit today without significant pathology, just sinus tachycardia at times. Since stopping sorafenib, she feels much better and hasn't had any further episodes.  - can follow up with cardiology as needed, encouraged her to stay active and stay hydrated. [name] [name] and her mother were reassured by the benign nature of the ambulatory heart rate monitor.    Patient was discussed with Dr. Hyacinth Meeker    History of Present Illness:  [name] is a 19 y.o. female with a history of [name] previously treated with chemotherapy agent Sorafenib and who has been referred for evaluation of presyncope.    [name] [name] established care with Drs. Alberteen Spindle and Sivak at her last visit for further workup of her presyncopal episodes.  At that time, she was wearing an ambulatory heart rate monitor which was prescribed at a another appointment while in [place].  She mailed back the monitor and we do not have the report back at the time of her appointment today.  However, we were able to call the company and have it faxed and went over it together in clinic.  Overall, the report is reassuring.  She not having episodes of arrhythmias or pauses.  Symptomatic episodes usually related to sinus tachycardia which at times happen during rest.  Reassuringly, she feels much better and is not any further episodes of palpitations or presyncope since stopping sorafenib.    Past Cardiac History:    EP Procedures and Devices:  Currently undergoing evaluation with Zio patch which was placed recently by the emergency room in [facility]    Non-Invasive Evaluation(s):  ?? Echocardiogram: Preliminarily appears unremarkable with normal RV and LV systolic and diastolic function.  No pericardial effusion.  No significant valvular abnormalities.  Formal result pending    ?? ECG  05/13/19 NSR with sinus arrhythmia.       Past Medical History:   Diagnosis Date   ??? [name]    ??? Disease of thyroid gland     multiple sub-5 mm cystic foci most compatible with colloid cysts, per Thyroid US   ??? [name] (familial adenomatous polyposis)    ??? [name]    ??? Intestinal polyps    ??? Vision problems        Past Surgical History:  Past Surgical History:   Procedure Laterality Date   ??? cystis removal     ??? desmoid removal     ??? PR COLONOSCOPY W/BIOPSY SINGLE/MULTIPLE N/A 10/27/2012    Procedure: COLONOSCOPY, FLEXIBLE, PROXIMAL TO SPLENIC FLEXURE; WITH BIOPSY, SINGLE OR MULTIPLE;  Surgeon: Shirlyn Goltz Mir, MD;  Location:  PEDS PROCEDURE ROOM [facility];  Service: Gastroenterology   ??? PR COLONOSCOPY W/BIOPSY SINGLE/MULTIPLE N/A 09/14/2013    Procedure: COLONOSCOPY, FLEXIBLE, PROXIMAL TO SPLENIC FLEXURE; WITH BIOPSY, SINGLE OR MULTIPLE;  Surgeon: Shirlyn Goltz Mir, MD;  Location: PEDS PROCEDURE ROOM [facility];  Service: Gastroenterology   ??? PR COLONOSCOPY W/BIOPSY SINGLE/MULTIPLE N/A 11/08/2014    Procedure: COLONOSCOPY, FLEXIBLE, PROXIMAL TO SPLENIC FLEXURE; WITH BIOPSY, SINGLE OR MULTIPLE;  Surgeon: Arnold Long Mir, MD;  Location: PEDS PROCEDURE ROOM [facility];  Service: Gastroenterology   ??? PR COLONOSCOPY W/BIOPSY SINGLE/MULTIPLE N/A 12/26/2015    Procedure: COLONOSCOPY, FLEXIBLE, PROXIMAL TO SPLENIC FLEXURE; WITH BIOPSY, SINGLE OR MULTIPLE;  Surgeon: Arnold Long Mir, MD;  Location: PEDS PROCEDURE ROOM [facility];  Service: Gastroenterology   ??? PR COLONOSCOPY W/BIOPSY SINGLE/MULTIPLE N/A 09/02/2017    Procedure: COLONOSCOPY, FLEXIBLE, PROXIMAL TO SPLENIC FLEXURE; WITH BIOPSY, SINGLE OR MULTIPLE;  Surgeon: Arnold Long Mir, MD;  Location: PEDS PROCEDURE ROOM [facility];  Service: Gastroenterology   ??? PR COLSC FLX W/REMOVAL LESION BY HOT BX FORCEPS N/A 08/27/2016    Procedure: COLONOSCOPY, FLEXIBLE, PROXIMAL TO SPLENIC FLEXURE; W/REMOVAL TUMOR/POLYP/OTHER LESION, HOT BX FORCEP/CAUTE;  Surgeon: Arnold Long Mir, MD;  Location: PEDS PROCEDURE ROOM [facility];  Service: Gastroenterology   ??? PR COLSC FLX W/RMVL OF TUMOR POLYP LESION SNARE TQ N/A 02/25/2019    Procedure: COLONOSCOPY FLEX; W/REMOV TUMOR/LES BY SNARE;  Surgeon: Helyn Numbers, MD;  Location: GI PROCEDURES [facility];  Service: Gastroenterology   ??? PR EXC SKIN BENIG 2.1-3 CM TRUNK,ARM,LEG Right 02/25/2017    Procedure: EXCISION, BENIGN LESION INCLUDE MARGINS, EXCEPT SKIN TAG, LEGS; EXCISED DIAMETER 2.1 TO 3.0 CM;  Surgeon: Clarene Duke, MD;  Location: CHILDRENS OR [facility];  Service: Plastics   ??? PR EXC SKIN BENIG 3.1-4 CM TRUNK,ARM,LEG Right 02/25/2017    Procedure: EXCISION, BENIGN LESION INCLUDE MARGINS, EXCEPT SKIN TAG, ARMS; EXCISED DIAMETER 3.1 TO 4.0 CM;  Surgeon: Clarene Duke, MD;  Location: CHILDRENS OR [facility];  Service: Plastics   ??? PR EXC SKIN BENIG >4 CM FACE,FACIAL Right 02/25/2017    Procedure: EXCISION, OTHER BENIGN LES INCLUD MARGIN, FACE/EARS/EYELIDS/NOSE/LIPS/MUCOUS MEMBRANE; EXCISED DIAM >4.0 CM;  Surgeon: Clarene Duke, MD;  Location: CHILDRENS OR [facility];  Service: Plastics   ??? PR UPPER GI ENDOSCOPY,BIOPSY N/A 10/27/2012    Procedure: UGI ENDOSCOPY; WITH BIOPSY, SINGLE OR MULTIPLE;  Surgeon: Shirlyn Goltz Mir, MD;  Location: PEDS PROCEDURE ROOM [facility];  Service: Gastroenterology   ??? PR UPPER GI ENDOSCOPY,BIOPSY N/A 09/14/2013    Procedure: UGI ENDOSCOPY; WITH BIOPSY, SINGLE OR MULTIPLE;  Surgeon: Shirlyn Goltz Mir, MD;  Location: PEDS PROCEDURE ROOM [facility];  Service: Gastroenterology   ??? PR UPPER GI ENDOSCOPY,BIOPSY N/A 11/08/2014    Procedure: UGI ENDOSCOPY; WITH BIOPSY, SINGLE OR MULTIPLE;  Surgeon: Arnold Long Mir, MD;  Location: PEDS PROCEDURE ROOM [facility];  Service: Gastroenterology   ??? PR UPPER GI ENDOSCOPY,BIOPSY N/A 12/26/2015    Procedure: UGI ENDOSCOPY; WITH BIOPSY, SINGLE OR MULTIPLE;  Surgeon: Arnold Long Mir, MD;  Location: PEDS PROCEDURE ROOM [facility];  Service: Gastroenterology   ??? PR UPPER GI ENDOSCOPY,BIOPSY N/A 08/27/2016    Procedure: UGI ENDOSCOPY; WITH BIOPSY, SINGLE OR MULTIPLE;  Surgeon: Arnold Long Mir, MD;  Location: PEDS PROCEDURE ROOM [facility];  Service: Gastroenterology   ??? PR UPPER GI ENDOSCOPY,BIOPSY N/A 09/02/2017    Procedure: UGI ENDOSCOPY; WITH BIOPSY, SINGLE OR MULTIPLE;  Surgeon: Arnold Long Mir, MD;  Location: PEDS PROCEDURE ROOM [facility];  Service: Gastroenterology   ??? TUMOR REMOVAL  Medications:  Current Outpatient Medications   Medication Sig Dispense Refill   ??? acetaminophen (TYLENOL) 500 MG tablet Take by mouth.     ??? clobetasoL (TEMOVATE) 0.05 % external solution Apply topically Two (2) times a day. To the scalp as needed for itching, redness. 50 mL 5   ??? famotidine (PEPCID) 40 MG tablet Take 40 mg by mouth daily.     ??? fexofenadine (ALLEGRA) 180 MG tablet Take one pill twice daily 100 tablet 3   ??? ondansetron (ZOFRAN) 8 MG tablet Take 1 tablet (8 mg total) by mouth every twelve (12) hours as needed for nausea. 60 tablet 2   ??? tacrolimus (PROTOPIC) 0.1 % ointment Apply 1 application topically Two (2) times a day. To rash on face 30 g 11   ??? triamcinolone (KENALOG) 0.1 % ointment Apply topically Two (2) times a day. To dry, scaly areas on the body for 1-2 weeks, then stop. Repeat as needed. 453 g 4 ??? cyproheptadine (PERIACTIN) 4 mg tablet Take 1 tablet (4 mg total) by mouth Three (3) times a day. (Patient not taking: Reported on 06/10/2019) 90 tablet 1   ??? SORAfenib (NEXAVAR) 200 mg tablet Take 1 tablet (200 mg total) by mouth daily. (Patient not taking: Reported on 06/10/2019) 30 tablet 2     No current facility-administered medications for this visit.        Allergies:  Allergies   Allergen Reactions   ??? Neomycin Swelling     Rxn after ear drops; ear swelling       Social History:  Social History     Socioeconomic History   ??? Marital status: Single     Spouse name: Not on file   ??? Number of children: Not on file   ??? Years of education: Not on file   ??? Highest education level: Not on file   Occupational History   ??? Not on file   Social Needs   ??? Financial resource strain: Not on file   ??? Food insecurity     Worry: Not on file     Inability: Not on file   ??? Transportation needs     Medical: Not on file     Non-medical: Not on file   Tobacco Use   ??? Smoking status: Never Smoker   ??? Smokeless tobacco: Never Used   Substance and Sexual Activity   ??? Alcohol use: No   ??? Drug use: No   ??? Sexual activity: Never   Lifestyle   ??? Physical activity     Days per week: Not on file     Minutes per session: Not on file   ??? Stress: Not on file   Relationships   ??? Social Wellsite geologist on phone: Not on file     Gets together: Not on file     Attends religious service: Not on file     Active member of club or organization: Not on file     Attends meetings of clubs or organizations: Not on file     Relationship status: Not on file   Other Topics Concern   ??? Do you use sunscreen? Yes   ??? Tanning bed use? No   ??? Are you easily burned? No   ??? Excessive sun exposure? No   ??? Blistering sunburns? Yes   Social History Narrative    Thelia lives at home with parents and 1 sister. Dog at home She does well in school.  Just finished high school and will be going to [employer] in fall 2019 to pursue either nursing or PA school.       Family History:  Family History   Problem Relation Age of Onset   ??? No Known Problems Mother    ??? No Known Problems Father    ??? No Known Problems Sister    ??? No Known Problems Brother    ??? No Known Problems Maternal Aunt    ??? No Known Problems Maternal Uncle    ??? No Known Problems Paternal Aunt    ??? No Known Problems Paternal Uncle    ??? Stroke Maternal Grandmother    ??? Other Maternal Grandmother         benign lesions of liver and pancreas, further details unknown   ??? No Known Problems Maternal Grandfather    ??? No Known Problems Paternal Grandmother    ??? No Known Problems Paternal Grandfather    ??? Anesthesia problems Neg Hx    ??? Broken bones Neg Hx    ??? Cancer Neg Hx    ??? Clotting disorder Neg Hx    ??? Collagen disease Neg Hx    ??? Diabetes Neg Hx    ??? Dislocations Neg Hx    ??? Fibromyalgia Neg Hx    ??? Gout Neg Hx    ??? Hemophilia Neg Hx    ??? Osteoporosis Neg Hx    ??? Rheumatologic disease Neg Hx    ??? Scoliosis Neg Hx    ??? Severe sprains Neg Hx    ??? Sickle cell anemia Neg Hx    ??? Spinal Compression Fracture Neg Hx    ??? Melanoma Neg Hx    ??? Basal cell carcinoma Neg Hx    ??? Squamous cell carcinoma Neg Hx      Review of Systems:  A 12-system review of systems was performed and was negative except as noted in the HPI.    Physical Exam:  VITAL SIGNS:   Vitals:    06/10/19 1619 06/10/19 1620   BP: 116/82 116/74   BP Site: R Arm R Arm   BP Position: Sitting Sitting   BP Cuff Size: Medium Medium   Pulse: 78    Temp: 36.3 ??C    TempSrc: Temporal    SpO2: 99%    Weight: 50.5 kg (111 lb 6.4 oz)    Height: 166.4 cm (5' 5.5)      Body mass index is 18.26 kg/m??.  GENERAL: NAD. appears younger then stated age, thin  HEENT: PERRL, normal sclera.  NECK: Supple w/ normal ROM. No JVD  RESPIRATORY: CTA bilaterally without wheezes or crackles.  Normal WOB.  CARDIOVASCULAR: RRR without m/r/g.  ABDOMEN: Soft, NT/ND. +BS  EXTREMITIES:  No LE edema. Warm and well perfused. Pale skin.     Pertinent Test Results:    Lab Review   Lab Results   Component Value Date    Creatinine 0.57 (L) 06/10/2019    Creatinine 0.50 07/01/2013    BUN 10 06/10/2019    BUN 10 07/01/2013    Potassium  06/10/2019      Comment:      Specimen Hemolyzed      Potassium 4.2 07/01/2013    Magnesium 2.2 07/15/2011    WBC 6.7 06/10/2019    WBC 6.8 06/13/2014    HGB 13.7 06/10/2019    HGB 14.8 06/13/2014    HCT 42.3 06/10/2019    HCT 43.8 06/13/2014  Platelet 212 06/10/2019    Platelet 221 06/13/2014    INR 1.06 10/11/2018    INR 1.02 04/23/2018    INR 1.1 10/31/2011

## 2019-06-10 NOTE — Unmapped (Signed)
[facility] INTERVENTIONAL RADIOLOGY - FOLLOW UP VISIT    Reason for visit: Scheduled/established patient.    Subjective:     CC:   Chief Complaint   Patient presents with   ??? Follow-up     [name] ([name])       History of Present Illness: HPI: Ms. 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 She was also seen by Dr. Donalynn Furlong and Dr. Debbe Mounts.      Interval History:  She discontinued Sorafenib due to side effects. She feels discomfort from the desmoid tumors in the chest wall. The most bothering desmoid tumors are right and left anterior chest wall desmoid tumors (right>left). The left anterior chest wall desmoid tumor is less significant since the last cryoablation. She also feels discomfort in the right scapular and left paraspinal areas. She has persistent soft tissue atrophy in the right forearm, but the skin depigmentation has improved. No pain in the right forearm desmoid tumor area. She developed new subcutaneous lumps in the right medial and lateral calf.     Past Medical/Surgical History:  Past Medical History:   Diagnosis Date   ??? [name]    ??? Disease of thyroid gland     multiple sub-5 mm cystic foci most compatible with colloid cysts, per Thyroid US   ??? [name] (familial adenomatous polyposis)    ??? [name]    ??? Intestinal polyps    ??? Vision problems        Past Surgical History:   Procedure Laterality Date   ??? cystis removal     ??? desmoid removal     ??? PR COLONOSCOPY W/BIOPSY SINGLE/MULTIPLE N/A 10/27/2012    Procedure: COLONOSCOPY, FLEXIBLE, PROXIMAL TO SPLENIC FLEXURE; WITH BIOPSY, SINGLE OR MULTIPLE;  Surgeon: Shirlyn Goltz Mir, MD;  Location: PEDS PROCEDURE ROOM [facility];  Service: Gastroenterology   ??? PR COLONOSCOPY W/BIOPSY SINGLE/MULTIPLE N/A 09/14/2013    Procedure: COLONOSCOPY, FLEXIBLE, PROXIMAL TO SPLENIC FLEXURE; WITH BIOPSY, SINGLE OR MULTIPLE;  Surgeon: Shirlyn Goltz Mir, MD;  Location: PEDS PROCEDURE ROOM [facility]; Service: Gastroenterology   ??? PR COLONOSCOPY W/BIOPSY SINGLE/MULTIPLE N/A 11/08/2014    Procedure: COLONOSCOPY, FLEXIBLE, PROXIMAL TO SPLENIC FLEXURE; WITH BIOPSY, SINGLE OR MULTIPLE;  Surgeon: Arnold Long Mir, MD;  Location: PEDS PROCEDURE ROOM [facility];  Service: Gastroenterology   ??? PR COLONOSCOPY W/BIOPSY SINGLE/MULTIPLE N/A 12/26/2015    Procedure: COLONOSCOPY, FLEXIBLE, PROXIMAL TO SPLENIC FLEXURE; WITH BIOPSY, SINGLE OR MULTIPLE;  Surgeon: Arnold Long Mir, MD;  Location: PEDS PROCEDURE ROOM [facility];  Service: Gastroenterology   ??? PR COLONOSCOPY W/BIOPSY SINGLE/MULTIPLE N/A 09/02/2017    Procedure: COLONOSCOPY, FLEXIBLE, PROXIMAL TO SPLENIC FLEXURE; WITH BIOPSY, SINGLE OR MULTIPLE;  Surgeon: Arnold Long Mir, MD;  Location: PEDS PROCEDURE ROOM [facility];  Service: Gastroenterology   ??? PR COLSC FLX W/REMOVAL LESION BY HOT BX FORCEPS N/A 08/27/2016    Procedure: COLONOSCOPY, FLEXIBLE, PROXIMAL TO SPLENIC FLEXURE; W/REMOVAL TUMOR/POLYP/OTHER LESION, HOT BX FORCEP/CAUTE;  Surgeon: Arnold Long Mir, MD;  Location: PEDS PROCEDURE ROOM [facility];  Service: Gastroenterology   ??? PR COLSC FLX W/RMVL OF TUMOR POLYP LESION SNARE TQ N/A 02/25/2019    Procedure: COLONOSCOPY FLEX; W/REMOV TUMOR/LES BY SNARE;  Surgeon: Helyn Numbers, MD;  Location: GI PROCEDURES [facility] [facility];  Service: Gastroenterology   ??? PR EXC SKIN BENIG 2.1-3 CM TRUNK,ARM,LEG Right 02/25/2017    Procedure: EXCISION, BENIGN LESION INCLUDE MARGINS, EXCEPT SKIN TAG, LEGS; EXCISED DIAMETER 2.1 TO 3.0 CM;  Surgeon: Clarene Duke, MD;  Location: CHILDRENS OR [facility];  Service: Plastics   ??? PR EXC SKIN BENIG 3.1-4 CM TRUNK,ARM,LEG Right 02/25/2017    Procedure: EXCISION, BENIGN LESION INCLUDE MARGINS, EXCEPT SKIN TAG, ARMS; EXCISED DIAMETER 3.1 TO 4.0 CM;  Surgeon: Clarene Duke, MD;  Location: CHILDRENS OR [facility];  Service: Plastics   ??? PR EXC SKIN BENIG >4 CM FACE,FACIAL Right 02/25/2017    Procedure: EXCISION, OTHER BENIGN LES INCLUD MARGIN, FACE/EARS/EYELIDS/NOSE/LIPS/MUCOUS MEMBRANE; EXCISED DIAM >4.0 CM;  Surgeon: Clarene Duke, MD;  Location: CHILDRENS OR [facility];  Service: Plastics   ??? PR UPPER GI ENDOSCOPY,BIOPSY N/A 10/27/2012    Procedure: UGI ENDOSCOPY; WITH BIOPSY, SINGLE OR MULTIPLE;  Surgeon: Shirlyn Goltz Mir, MD;  Location: PEDS PROCEDURE ROOM [facility];  Service: Gastroenterology   ??? PR UPPER GI ENDOSCOPY,BIOPSY N/A 09/14/2013    Procedure: UGI ENDOSCOPY; WITH BIOPSY, SINGLE OR MULTIPLE;  Surgeon: Shirlyn Goltz Mir, MD;  Location: PEDS PROCEDURE ROOM [facility];  Service: Gastroenterology   ??? PR UPPER GI ENDOSCOPY,BIOPSY N/A 11/08/2014    Procedure: UGI ENDOSCOPY; WITH BIOPSY, SINGLE OR MULTIPLE;  Surgeon: Arnold Long Mir, MD;  Location: PEDS PROCEDURE ROOM [facility];  Service: Gastroenterology   ??? PR UPPER GI ENDOSCOPY,BIOPSY N/A 12/26/2015    Procedure: UGI ENDOSCOPY; WITH BIOPSY, SINGLE OR MULTIPLE;  Surgeon: Arnold Long Mir, MD;  Location: PEDS PROCEDURE ROOM [facility];  Service: Gastroenterology   ??? PR UPPER GI ENDOSCOPY,BIOPSY N/A 08/27/2016    Procedure: UGI ENDOSCOPY; WITH BIOPSY, SINGLE OR MULTIPLE;  Surgeon: Arnold Long Mir, MD;  Location: PEDS PROCEDURE ROOM [facility];  Service: Gastroenterology   ??? PR UPPER GI ENDOSCOPY,BIOPSY N/A 09/02/2017    Procedure: UGI ENDOSCOPY; WITH BIOPSY, SINGLE OR MULTIPLE;  Surgeon: Arnold Long Mir, MD;  Location: PEDS PROCEDURE ROOM [facility];  Service: Gastroenterology   ??? TUMOR REMOVAL         Family History:  Patient family history includes No Known Problems in her brother, father, maternal aunt, maternal grandfather, maternal uncle, mother, paternal aunt, paternal grandfather, paternal grandmother, paternal uncle, and sister; Other in her maternal grandmother; Stroke in her maternal grandmother.    Social History:    Social History     Socioeconomic History   ??? Marital status: Single     Spouse name: Not on file   ??? Number of children: Not on file   ??? Years of education: Not on file   ??? Highest education level: Not on file Occupational History   ??? Not on file   Social Needs   ??? Financial resource strain: Not on file   ??? Food insecurity     Worry: Not on file     Inability: Not on file   ??? Transportation needs     Medical: Not on file     Non-medical: Not on file   Tobacco Use   ??? Smoking status: Never Smoker   ??? Smokeless tobacco: Never Used   Substance and Sexual Activity   ??? Alcohol use: No   ??? Drug use: No   ??? Sexual activity: Never   Lifestyle   ??? Physical activity     Days per week: Not on file     Minutes per session: Not on file   ??? Stress: Not on file   Relationships   ??? Social Wellsite geologist on phone: Not on file     Gets together: Not on file     Attends religious service: Not on file  Active member of club or organization: Not on file     Attends meetings of clubs or organizations: Not on file     Relationship status: Not on file   Other Topics Concern   ??? Do you use sunscreen? Yes   ??? Tanning bed use? No   ??? Are you easily burned? No   ??? Excessive sun exposure? No   ??? Blistering sunburns? Yes   Social History Narrative    Mykhia lives at home with parents and 1 sister. Dog at home She does well in school.  Just finished high school and will be going to [employer] in fall 2019 to pursue either nursing or PA school.       Allergies:  Allergies   Allergen Reactions   ??? Neomycin Swelling     Rxn after ear drops; ear swelling       Medications:    Current Outpatient Medications:   ???  acetaminophen (TYLENOL) 500 MG tablet, Take by mouth., Disp: , Rfl:   ???  clobetasoL (TEMOVATE) 0.05 % external solution, Apply topically Two (2) times a day. To the scalp as needed for itching, redness., Disp: 50 mL, Rfl: 5  ???  cyproheptadine (PERIACTIN) 4 mg tablet, Take 1 tablet (4 mg total) by mouth Three (3) times a day. (Patient not taking: Reported on 06/10/2019), Disp: 90 tablet, Rfl: 1  ???  famotidine (PEPCID) 40 MG tablet, Take 40 mg by mouth daily., Disp: , Rfl:   ???  fexofenadine (ALLEGRA) 180 MG tablet, Take one pill twice daily, Disp: 100 tablet, Rfl: 3  ???  ondansetron (ZOFRAN) 8 MG tablet, Take 1 tablet (8 mg total) by mouth every twelve (12) hours as needed for nausea., Disp: 60 tablet, Rfl: 2  ???  SORAfenib (NEXAVAR) 200 mg tablet, Take 1 tablet (200 mg total) by mouth daily. (Patient not taking: Reported on 06/10/2019), Disp: 30 tablet, Rfl: 2  ???  tacrolimus (PROTOPIC) 0.1 % ointment, Apply 1 application topically Two (2) times a day. To rash on face, Disp: 30 g, Rfl: 11  ???  triamcinolone (KENALOG) 0.1 % ointment, Apply topically Two (2) times a day. To dry, scaly areas on the body for 1-2 weeks, then stop. Repeat as needed., Disp: 453 g, Rfl: 4  No current facility-administered medications for this visit.     Review of Systems:  Complete ROS performed. All systems negative except as detailed in HPI or below.     Objective:     Physical Exam    General: WD, WN 19 y.o. female in NAD.    HEENT: Normocephalic.  Sclera anicteric. EOMI. PERRLA.  Nares patent, OP pink, moist and without exudate.       Cardiovascular:  +2 bilateral radial and DP pulses.    Respiratory: Respirations even, nonlabored.  No wheezes, crackles, rhonchi.    Skin:  No rashes, lesions, jaundice, skin breakdown.     Musculoskeletal:  No clubbing, cyanosis or edema.  No joint pain upon palpation. Soft tissue atrophy and skin dimpling in the right lateral forearm.     Neurological: Alert and oriented x 3.  Steady gait.  Normal sensation.     Psych/Mental Health:  Appropriate affect.    Pertinent Laboratory Values:     Lab Results   Component Value Date    WBC 6.7 06/10/2019    HGB 13.7 06/10/2019    HCT 42.3 06/10/2019    PLT 212 06/10/2019       Lab Results  Component Value Date    NA 140 06/10/2019    K  06/10/2019      Comment:      Specimen Hemolyzed      CL 107 06/10/2019    CO2 26.0 06/10/2019    BUN 10 06/10/2019    CREATININE 0.57 (L) 06/10/2019    GLU 90 06/10/2019    CALCIUM 9.3 06/10/2019    MG 2.2 07/15/2011    PHOS 5.0 07/01/2013       Lab Results Component Value Date    BILITOT  06/10/2019      Comment:      Specimen Hemolyzed      BILIDIR <0.1 10/31/2011    PROT  06/10/2019      Comment:      Specimen Hemolyzed      ALBUMIN  06/10/2019      Comment:      Specimen Hemolyzed      ALT 12 06/10/2019    AST  06/10/2019      Comment:      Specimen Hemolyzed      ALKPHOS  06/10/2019      Comment:      Specimen Hemolyzed      GGT 13 10/31/2011       Lab Results   Component Value Date    INR 1.06 10/11/2018    APTT 31.8 10/11/2018         Imaging Source: I independently reviewed the MRI image from 06/10/2019 and I agree with the findings/interpretation.  --Redemonstrated multiple desmoid tumors involving the soft tissues of the anterior and posterior thoracic wall. These lesions are grossly unchanged in size given differences in measuring technique.   -- New lesion overlying midline low thoracic spine.  I performed bedside ultrasound, and it showed decreased in size of the right forearm desmoid tumor.     Assessment/Plan (Medical Decision Making):     This is a 19 y.o. Female with Gardener syndrome and multiple desmoid tumors. She underwent multiple surgical resections of the desmoid tumors and osteomas.   She discontinued Sorafenib due to side effects (hair loss, hypertension, bradycardia). Her right forearm soft tissue atrophy is stable, but the skin depigmentation has improved.   Bedside ultrasound showed decreased in size of the desmoid tumor secondary to steroid injection.   Follow-up MRI showed the left anterior chest wall desmoid tumor decreased in size secondary to cryoablation.  Multiple treatment options were discussed with the patient and her mother.   She wants additional cryoablation for the symptomatic desmoid tumors in the right and left anterior chest wall.    Plan:   CT guided Cryoablation of desmoid tumors in the right and left anterior chest wall under general anesthesia on 08/04/2019.  Cryoablation of the paraspinal and right scapular area desmoid tumors if surgical resection is not feasible.  Surgical resection of the newly developed subcutaneous lumps in the right medial and lateral calf.    I personally spent 30 minutes face-to-face with the patient and greater than 50% of that time was spent in counseling and coordinating care with the patient regarding treatment plan.

## 2019-06-10 NOTE — Unmapped (Signed)
It was a pleasure seeing you today. We will schedule you for a cryoablation of desmoid tumors in the chest wall.  The procedure will be done under general anesthesia.  Have nothing to eat or drink 8 hours before your procedure and come with an adult who can stay the entire time and can drive you home.      Feel free to contact us with any questions or concerns.    VIR clinic: [phone]  E-mail: [email or web address]

## 2019-06-12 NOTE — Unmapped (Signed)
I was immediately available via  phone/pager or present on site.  I reviewed and discussed the case with the resident, but did not see the patient.  I agree with the assessment and plan as documented in the resident's note. Dynver Clemson F Jameisha Stofko, MD

## 2019-06-13 DIAGNOSIS — D481 Neoplasm of uncertain behavior of connective and other soft tissue: Principal | ICD-10-CM

## 2019-06-13 NOTE — Unmapped (Signed)
I called mom to say  1. erlotinib sulindac written abuot in fap for polyps but effect on desmoids not clear    2. I spoke with dr Forbes Cellar in adult onc who has given erotinib alone for plyps and not sure about desmoids but I believe she thinks that tox picture very similar to sorafenib which is my understanding    3. Plan is for cryo for one lesion by dr kim in 07/2019 and I am glad to talk about combo trial. In meantime if dr sandler wants to use sulindac for polyps I think that would be great and I can add erlotinib if K is in town for a protracted period. I dont want to do this long distance.    4. I mentioned that after graduation I will transition to adult (dr greeely Corky Downs does desomoids)

## 2019-06-15 NOTE — Unmapped (Signed)
spoke with patient will all back at a later time to schedule with Dr. Lenise Arena. patient is aware Dr. Lenise Arena would like them back 12/2019

## 2019-06-16 NOTE — Unmapped (Signed)
It was  medically necessary for me to see the patient in addition to the nurse practitioner. I discussed the findings, assessment and plan with the patient.      As noted, clinically doing well.  Still having some discomfort at the chest and on her back but no significant changes.  Fortunately, her imaging shows essentially stable findings.  All of the previous seen lesions are fortunately stable without any growth, going back to imaging from late 2019.    Challenging situation in the setting.  Fortunately her lesions appear to be stable without any significant change for almost a year and a half.  In my estimation, it would be optimal to continue to observe things.  Unfortunately, excising the current truncal lesions would be challenging given the anatomic location and even identifying these lesions would be challenging for some of these intramuscular lesions.  I have concerns that the morbidity of resection would certainly be greater than her previously excised lesions which have all been subcutaneous and peripheral.  The only current indication for intervening at this point would be trying to manage any discomfort she has associated with these.  From a surgical perspective, I have concerns that in these anatomic locations, surgical resection would likely be fraught with positive margins, increased risk for local recurrence and potential disability/morbidity associated with trying to resect these intramuscular lesions.  As such, I have recommended observation.  Would repeat imaging in 6 months or so, and would recommend coordinating that of course with any additional medical interventions that Dr. Ciro Backer may be planning.    [name] [name]. Lenise Arena, MD  [facility] Surgical Oncology  [address], [address]  [address]  [place], [place]  [address]  Phone: [phone]  FAX: [phone]

## 2019-06-23 ENCOUNTER — Encounter
Admit: 2019-06-23 | Discharge: 2019-06-24 | Payer: PRIVATE HEALTH INSURANCE | Attending: Plastic and Reconstructive Surgery | Primary: Plastic and Reconstructive Surgery

## 2019-06-23 DIAGNOSIS — D481 Neoplasm of uncertain behavior of connective and other soft tissue: Secondary | ICD-10-CM | POA: Diagnosis not present

## 2019-06-23 DIAGNOSIS — D485 Neoplasm of uncertain behavior of skin: Secondary | ICD-10-CM | POA: Diagnosis not present

## 2019-06-23 NOTE — Unmapped (Signed)
Pediatric Plastic Surgery  Patient Video Visit    I am located on-site and the patient is located off-site for this visit.       Assessment/Plan:   There are no diagnoses linked to this encounter.       [name] [name] is a 19 y.o. female with [name] and multiple desmoid tumors    - Discussed excision of the two lesions on her calf as an outpatient procedure  - Will plan to proceed to the OR on 08/05/19. Risks and benefits of surgery were discussed, including but not limited to bleeding, infection, anesthesia, scarring, damage to surrounding structures and need for further procedures. Questions were answered and informed consent will be obtained the day of the procedure.   - Encouraged to call the [facility] Plastic Surgery office with any questions or concerns.    Subjective     History of Present Illness: [name] [name] is a 20 y.o. 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 She is followed by Dr. Ciro Backer and Dr. Selena Batten, as well as Dr. Lenise Arena. She presents today to discuss excision of two new areas on the right calf. One lesion has been present for over a year and went down in size with chemotherapy. It has since regrown and gets large enough to bruise the overlying skin. She has another lesion that popped up about a month ago, which has been stable in size. She desires excision of both lesions. Dr. Selena Batten is planning cryoablation on 5/27 and she will be admitted overnight .         Current Outpatient Medications:   ???  acetaminophen (TYLENOL) 500 MG tablet, Take by mouth., Disp: , Rfl:   ???  clobetasoL (TEMOVATE) 0.05 % external solution, Apply topically Two (2) times a day. To the scalp as needed for itching, redness., Disp: 50 mL, Rfl: 5  ???  famotidine (PEPCID) 40 MG tablet, Take 40 mg by mouth daily., Disp: , Rfl:   ???  ondansetron (ZOFRAN) 8 MG tablet, Take 1 tablet (8 mg total) by mouth every twelve (12) hours as needed for nausea., Disp: 60 tablet, Rfl: 2  ???  cyproheptadine (PERIACTIN) 4 mg tablet, Take 1 tablet (4 mg total) by mouth Three (3) times a day. (Patient not taking: Reported on 06/10/2019), Disp: 90 tablet, Rfl: 1  ???  fexofenadine (ALLEGRA) 180 MG tablet, Take one pill twice daily, Disp: 100 tablet, Rfl: 3  ???  SORAfenib (NEXAVAR) 200 mg tablet, Take 1 tablet (200 mg total) by mouth daily. (Patient not taking: Reported on 06/10/2019), Disp: 30 tablet, Rfl: 2  ???  tacrolimus (PROTOPIC) 0.1 % ointment, Apply 1 application topically Two (2) times a day. To rash on face, Disp: 30 g, Rfl: 11  ???  triamcinolone (KENALOG) 0.1 % ointment, Apply topically Two (2) times a day. To dry, scaly areas on the body for 1-2 weeks, then stop. Repeat as needed., Disp: 453 g, Rfl: 4    Allergies   Allergen Reactions   ??? Neomycin Swelling     Rxn after ear drops; ear swelling       Social History  Accompanied by mom for this video visit.    ROS  See HPI. All other systems reviewed are negative.    Physical Exam   NAD  Right lateral calf: ~3x3cm subcutaneous lesion, appears firm and well defined, adjacent bruising of the skin  Right medial calf: ~1x1cm subcutaneous mass, well defined, round      Approximately  10 min was spent on this medically necessary service. Pt visit by video visit in the setting of State of Emergency due to COVID-19 Pandemic.         I spent 5 minutes on the real-time audio and video with the patient on the date of service. I spent an additional 5 minutes on pre- and post-visit activities on the date of service.     The patient was physically located in [place] or a state in which I am permitted to provide care. The patient and/or parent/guardian understood that s/he may incur co-pays and cost sharing, and agreed to the telemedicine visit. The visit was reasonable and appropriate under the circumstances given the patient's presentation at the time.    The patient and/or parent/guardian has been advised of the potential risks and limitations of this mode of treatment (including, but not limited to, the absence of in-person examination) and has agreed to be treated using telemedicine. The patient's/patient's family's questions regarding telemedicine have been answered.     If the visit was completed in an ambulatory setting, the patient and/or parent/guardian has also been advised to contact their provider???s office for worsening conditions, and seek emergency medical treatment and/or call 911 if the patient deems either necessary.

## 2019-06-23 NOTE — Unmapped (Signed)
[facility] Plastic Surgery Contact Phone Numbers    Darleene Cleaver MD:   Email: [email or web address]    Nurse: Venetia Maxon RN, BSN    Phone: [phone]   Email: [email or web address]    OR Scheduler: Kristine Royal  Phone: [phone]  Email: [email or web address]    Financial Navigator: Almon Register    Office:  [phone]   Fax:  [phone]   Email: [email or web address]    APPOINTMENTS    [facility]  [address], [place], [place] [address]  Ground [facility]  Phone: [phone]  Fax: [phone]     [facility]  [address] [place], [place] [address]   1st Floor [facility]  Phone: [phone]  Fax: [phone]    Administrative Office  [address]  [place], [place] [address]  Phone:  [phone]  Fax:  [phone]    [facility]  [place] [facility], [place] [address] [facility]  [place], [place] [address]    Craniofacial Center Clinical Manager: Carman Ching   Phone:  [phone]    Fax: [phone]   Email: [email or web address]     AFTER HOURS/HOLIDAYS:  Plastic Surgeon on call:  [phone]    PRECARE:   The day prior to your scheduled surgery pre-care will call you with instructions.  If you have not heard from them by 4pm and would like to check on the status of your surgery please call:   MAIN: [phone]   Ambulatory Surgical Center: [phone]  * IF you need to RESCHEDULE or CANCEL your procedure, please CALL PRECARE at least a week in advance of your surgery date.     [facility] Information (closings, etc): [phone]

## 2019-06-24 DIAGNOSIS — D481 Neoplasm of uncertain behavior of connective and other soft tissue: Principal | ICD-10-CM

## 2019-06-24 DIAGNOSIS — Q8789 Other specified congenital malformation syndromes, not elsewhere classified: Principal | ICD-10-CM

## 2019-07-05 DIAGNOSIS — R630 Anorexia: Principal | ICD-10-CM

## 2019-07-05 MED ORDER — CYPROHEPTADINE 4 MG TABLET
ORAL_TABLET | 1 refills | 0.00000 days
Start: 2019-07-05 — End: ?

## 2019-07-07 MED ORDER — CYPROHEPTADINE 4 MG TABLET
ORAL_TABLET | ORAL | 1 refills | 0.00000 days | Status: CP
Start: 2019-07-07 — End: ?

## 2019-07-20 DIAGNOSIS — L729 Follicular cyst of the skin and subcutaneous tissue, unspecified: Secondary | ICD-10-CM | POA: Diagnosis not present

## 2019-07-20 MED ORDER — DOXYCYCLINE HYCLATE 100 MG CAPSULE
ORAL | 0.00000 days
Start: 2019-07-20 — End: ?

## 2019-07-22 NOTE — Unmapped (Unsigned)
08/05/2019    ??  Followup  for??46????year-old white female with [name] ([name]), multiple desmoids??(post????cryotherapy #3??by Dr. Selena Batten of [facility] 08/2018??and steroid injections to arm several months ago), epidermal cysts, and GI polyps,??last seen by me 06/10/19 w chest mri, visits with dr Selena Batten, dr Izola Price of surg,  and cardiology. Stopping sorafenib 05/12/19 due to significant toxicity. Seen at my request by cardiology (Dr Jon Billings) 05/2019 with syncope which they attributed to nausea, dehydration with nl  Ekg (though had had mild bradycardia on sevl occasions); echo quality unclear but probably ok--see report. Also significant hair loss and derm toxicity though managed by Dr Kateri Mc, and fatigue, all slowly resolving prolbems. Despite some subtle shrinkage of visible desmoids on sorafenib??and cut back to 100mg  a day I elected to stop sorafenib especially since most of management has been long distance while K is in college in [place]. Chest pain at site of one small right ant lesion, growth in rLE lesion x2.   K and boyfriend both were covid + and some mild respiratory sx in Nov at which time I had held sorafenib and resumed after phone call follow up in ?jan. Of note, lesions and surrounding skin seem to get itchy off sorafenib. Colonoscopy 02/2020 with many benign polyps not on sulindac. No more syncope, eating fair on zofran with good nausea control. Menses resumed.    Covid  ??  Boyfriend, not having intercourse, no ocp. No smoking or drugs  ??  No longer??on??sulindac .????She has had multiple mass excisions most recently by Dr. Lucretia Roers of plastic??surgery??02/2017 with inclusion cysts and osteomas,no malignancy;??jaw pits, which are being followed by dental, several removed over past yr, some delayed dentition--no canines. Wearing ??invisilon no other hardward . Has had thyroid nodules with low tsh, low free T4 last tested a year ago being follwed by endo, Dr. Timmothy Euler??though with NCCN recs of annual exam no [name].??Last scans 10/2018. PMH+??multiple new desmoids; numbness of both arms intermittently; recent right arm sprain ??with fall and in brace for another 2 wks. Light but constant periods for 3 wks. Other issues ankles, knees hurt??w??good response of wrist to intralesional steroids??no longer an issue/  ??  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 ??Denies sex, drugs, etoh. Going back to college in [place]??end dec. ??  ??  REVIEW OF SYSTEMS: Times 12 is otherwise unchanged.   ??  FAMILY HISTORY AND SOCIAL HISTORY:??see above  ??  Meds: zofran topicals  ??  PHYSICAL EXAMINATION:??last??Wt ??50.7 kg    Looks scrawny as always but wt up a bit     ??mask on and with recent hx I did not do oral exam  GENERAL:??hair looks normal??not particularly thinSKIN: Shows some nevi, one on the right lower back no change from last visit??dry skin on extremities improved off sorafenib  ??HEENT: ??deferred????NECK: Supple.Thyroid??nl exam  CHEST: Clear.??I still cant feel right upper ant chest lesion that K says she feels, though this was present on past scans??some redness of skin left axillary line where itching??BREASTS:??deferred--CARDIAC: clear??regular rr no murmurs??????????BACK: Notable for her old surgical scars.??Again, I dont feel mass where K says she feels one under scar????well healed??Abdomen--nontender no HS????GENITOURINARY:??deferred  EXTREMITIES??1.5 cm??softer????nontender lesion r tibia a bit bigger than beforeand new 1/4cm medial tibial lesion??K feels others which I cant be sure of on extrem??????????NEUROLOGIC:color sensation nl in lue-  Nonfocal. Mostly right handed??Karnofsky??90%.  ??  MEDICAL DECISION MAKING:??No meds for the desmoids at this time. preg test though post mri ??cbc chems??will regroup with drs kim, sandler, and  surg. Mom showed me article on erlotinib +sulinda c in Gardners but that did not address response to desmoids. I had spoken with Dr Forbes Cellar of adult GI onc who I believe had treated one pat with erlotinib alone with no major effect on desmoids and tox profile similar to sorafenib.  ??  ASSESSMENT:  1. Familial adenomatous polyposis.??Risk benefit fr sorafenib not good and will reassess cryo, surgery, possibly sulindac/erlotinib. Glad to consider vcr methotrexate but only ~30% chance of response. GI f/u.??Genetics counseling if pregnancy contemplated down the line.seeing dr calikoglu of??[facility] (may need adult transition).??dental monitoring is ongoing.I would consider??foundation one??though may not find anything--tissue from 2004 bx may or may not still be available--will hold for now.  ????  2. Colonic and gastric polyps secondary to??fap and I dont know if these will respond in parallel with desmoids  ??  3. Hair loss-seen by dr Kateri Mc of??derm??and will get fu appt though doing better with occ creams4. Abn thyroid tests--f/u with endo??--  5. Dental issues????See above????neds f/u visit. Finances and coding seeem to be an issue but mom needs to address this  5. Irregular periods.??period last month; nl coags in past. May be wt related but getting a bit better. May need ocp  6. h/o covid  7. Well care--for now per routine. Ok for covid shot. Will order periactin for wt

## 2019-07-25 ENCOUNTER — Other Ambulatory Visit: Payer: Self-pay

## 2019-07-25 ENCOUNTER — Encounter: Payer: Self-pay | Admitting: Physician Assistant

## 2019-07-25 ENCOUNTER — Ambulatory Visit (INDEPENDENT_AMBULATORY_CARE_PROVIDER_SITE_OTHER): Payer: BLUE CROSS/BLUE SHIELD | Admitting: Physician Assistant

## 2019-07-25 VITALS — BP 108/70 | HR 72 | Temp 98.2°F | Ht 66.0 in | Wt 112.5 lb

## 2019-07-25 DIAGNOSIS — H25041 Posterior subcapsular polar age-related cataract, right eye: Secondary | ICD-10-CM | POA: Diagnosis not present

## 2019-07-25 DIAGNOSIS — D369 Benign neoplasm, unspecified site: Secondary | ICD-10-CM

## 2019-07-25 DIAGNOSIS — L0291 Cutaneous abscess, unspecified: Secondary | ICD-10-CM | POA: Diagnosis not present

## 2019-07-25 DIAGNOSIS — Q8789 Other specified congenital malformation syndromes, not elsewhere classified: Secondary | ICD-10-CM

## 2019-07-25 DIAGNOSIS — E559 Vitamin D deficiency, unspecified: Secondary | ICD-10-CM | POA: Diagnosis not present

## 2019-07-25 DIAGNOSIS — H524 Presbyopia: Secondary | ICD-10-CM | POA: Diagnosis not present

## 2019-07-25 NOTE — Patient Instructions (Signed)
It was great to see you!  We will be in touch with your lab results.  Abscess care  1. If you have an abscess that has not drained, apply heat to the affected area. Use the heat source that your health care provider recommends, such as a moist heat pack or a heating pad. ? Place a towel between your skin and the heat source. ? Leave the heat on for 20-30 minutes. ? Remove the heat if your skin turns bright red. This is especially important if you are unable to feel pain, heat, or cold. You may have a greater risk of getting burned. 2. Follow instructions from your health care provider about how to take care of your abscess. Make sure you: ? Cover the abscess with a bandage (dressing). ? Change your dressing or gauze as told by your health care provider. ? Wash your hands with soap and water before you change the dressing or gauze. If soap and water are not available, use hand sanitizer. 3. Check your abscess every day for signs of a worsening infection. Check for: ? More redness, swelling, or pain. ? More fluid or blood. ? Warmth. ? More pus or a bad smell. General instructions 1. To avoid spreading the infection: ? Do not share personal care items, towels, or hot tubs with others. ? Avoid making skin contact with other people. 2. Keep all follow-up visits as told by your health care provider. This is important. Contact a health care provider if you have:  More redness, swelling, or pain around your abscess.  More fluid or blood coming from your abscess.  Warm skin around your abscess.  More pus or a bad smell coming from your abscess.  A fever.  Muscle aches.  Chills or a general ill feeling. Get help right away if you:  Have severe pain.  See red streaks on your skin spreading away from the abscess. Summary  A skin abscess is an infected area on or under your skin that contains a collection of pus and other material.  A small abscess that drains on its own may not  need treatment.  Treatment for larger abscesses may include having a procedure to drain the abscess and taking an antibiotic. This information is not intended to replace advice given to you by your health care provider. Make sure you discuss any questions you have with your health care provider.  Take care,  Inda Coke PA-C

## 2019-07-25 NOTE — Progress Notes (Signed)
Lisa Donovan is a 19 y.o. female is here to establish care.  I acted as a Education administrator for [employer], PA-C Anselmo Pickler, LPN   History of Present Illness:   Chief Complaint  Patient presents with  . Establish Care  . Cyst    HPI   Patient is new to establish at our office for primary care today.  [name] She is currently followed by her pediatric hematologist/oncologist, Dr. Ike Bene, at [facility], for [place]. She recently underwent a round of chemotherapy with Sorafenib, but was unable to tolerate the complete duration of this due to side effects (nausea, vomiting, dehydration, pre-syncope.)  Desmoid cysts Recently has been undergoing cryoablation of desmoid tumors under interventional radiology. She is planning for surgical removal of a desmoid cyst to her R lower leg at the end of this month.  Cyst Pt c/o cyst on right inner calf below knee, area is red and has a black center. Pt went to Urgent care on Wednesday and was prescribed Doxycycline 100 mg BID. She has been unable to take the oral doxycycline as prescribed due to history of GI side effects, but was able to take both doxycycline tablets yesterday. Denies: fever, chills, streaking erythema. Did a lot of standing over the weekend and had improvement of her symptoms.  Vit D deficiency History of this. Mom is requesting labs to be re-checked today.  Health Maintenance Due  Topic Date Due  . HIV Screening  Never done    Past Medical History:  Diagnosis Date  . Colon polyps    Dx at age 60, has colonoscopy yearly  . [name]    Dx at age 89  . GERD (gastroesophageal reflux disease)   . H/O colonoscopy    Pt has has them yearly  . History of endoscopy      Social History   Socioeconomic History  . Marital status: Single    Spouse name: Not on file  . Number of children: Not on file  . Years of education: Not on file  . Highest education level: Not on file  Occupational  History  . Not on file  Tobacco Use  . Smoking status: Never Smoker  . Smokeless tobacco: Never Used  Substance and Sexual Activity  . Alcohol use: Never  . Drug use: Never  . Sexual activity: Never  Other Topics Concern  . 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 Difficulty of Paying Living Expenses:   Food Insecurity:   . Worried About Charity fundraiser in the Last Year:   . Arboriculturist in the Last Year:   Transportation Needs:   . Film/video editor (Medical):   Marland Kitchen Lack of Transportation (Non-Medical):   Physical Activity:   . Days of Exercise per Week:   . Minutes of Exercise per Session:   Stress:   . Feeling of Stress :   Social Connections:   . Frequency of Communication with Friends and Family:   . Frequency of Social Gatherings with Friends and Family:   . Attends Religious Services:   . Active Member of Clubs or Organizations:   . Attends Archivist Meetings:   Marland Kitchen Marital Status:   Intimate  Partner Violence:   . Fear of Current or Ex-Partner:   . Emotionally Abused:   Marland Kitchen Physically Abused:   . Sexually Abused:     Past Surgical History:  Procedure Laterality Date  . Oral surgery    . Tumor removed     Numerous surgies since she was 57 months old    History reviewed. No pertinent family history.  PMHx, SurgHx, SocialHx, FamHx, Medications, and Allergies were reviewed in the Visit Navigator and updated as appropriate.   Patient Active Problem List   Diagnosis Date Noted  . Chemotherapy-induced nausea 05/14/2019  . Syncope 05/14/2019  . Thyroid cyst 10/05/2018  . [name] of skin 08/25/2012  . Intestinal polyps 08/25/2012  . Gardner's syndrome 07/14/2012  . Other benign neoplasm of connective and other soft tissue of upper limb,  including shoulder 02/26/2011  . Benign neoplasm of skin of trunk 01/17/2004    Social History   Tobacco Use  . Smoking status: Never Smoker  . Smokeless tobacco: Never Used  Substance Use Topics  . Alcohol use: Never  . Drug use: Never    Current Medications and Allergies:    Current Outpatient Medications:  .  acetaminophen (TYLENOL) 500 MG tablet, Take by mouth., Disp: , Rfl:  .  doxycycline (VIBRAMYCIN) 100 MG capsule, Take 100 mg by mouth 2 (two) times daily., Disp: , Rfl:  .  famotidine (PEPCID) 40 MG tablet, Take 40 mg by mouth as needed. , Disp: , Rfl:  .  ondansetron (ZOFRAN) 8 MG tablet, SMARTSIG:1 Tablet(s) By Mouth Every 12 Hours PRN, Disp: , Rfl:    Allergies  Allergen Reactions  . Neomycin Swelling and Rash    Review of Systems   ROS  Negative unless otherwise specified per HPI.   Vitals:   Vitals:   07/25/19 0859  BP: 108/70  Pulse: 72  Temp: 98.2 F (36.8 C)  TempSrc: Temporal  SpO2: 98%  Weight: 112 lb 8 oz (51 kg)  Height: 5\' 6"  (1.676 m)     Body mass index is 18.16 kg/m.   Physical Exam:    Physical Exam Vitals and nursing note reviewed.  Constitutional:      General: She is not in acute distress.    Appearance: She is well-developed. She is not ill-appearing or toxic-appearing.  Cardiovascular:     Rate and Rhythm: Normal rate and regular rhythm.     Pulses: Normal pulses.     Heart sounds: Normal heart sounds, S1 normal and S2 normal.     Comments: No LE edema Pulmonary:     Effort: Pulmonary effort is normal.     Breath sounds: Normal breath sounds.  Skin:    General: Skin is warm and dry.     Comments: Erythematous cyst-like lesion with black center to medial lower left leg, significant TTP, approximately the size of a quarter  Neurological:     Mental Status: She is alert.     GCS: GCS eye subscore is 4. GCS verbal subscore is 5. GCS motor subscore is 6.  Psychiatric:        Speech: Speech normal.        Behavior:  Behavior normal. Behavior is cooperative.    I&D --Procedure Meds, vitals, and allergies reviewed.  Indication: suspect abscess Pt complaints of: erythema, pain, swelling Location: medial lower left leg Size: approx size of a quarter  Informed consent obtained.  Pt aware of risks not limited to but including infection, bleeding, damage to near  by organs.  Prep: etoh/betadine  Anesthesia: 1%lidocaine with epi, good effect  Incision made with #11 blade  Wound explored and loculations removed  Wound packed with iodoform gauze  Tolerated well    Assessment and Plan:    Lisa Donovan was seen today for establish care and cyst.  Diagnoses and all orders for this visit:  Gardner's syndrome Management per pediatric hematology and GI. -     CBC with Differential/Platelet; Future -     Comprehensive metabolic panel; Future -     Iron, TIBC and Ferritin Panel; Future  Vitamin D deficiency Labs to be checked per patient and mother request. -     Cancel: VITAMIN D 25 Hydroxy (Vit-D Deficiency, Fractures) -     VITAMIN D 25 Hydroxy (Vit-D Deficiency, Fractures); Future  Abscess I&D performed today. Patient tolerated procedure well. Follow-up with plastic surgery as needed. Continue doxycycline. Worsening precautions advised.  Dermoid cyst Management per plastics.  . Reviewed expectations re: course of current medical issues. . Discussed self-management of symptoms. . Outlined signs and symptoms indicating need for more acute intervention. . Patient verbalized understanding and all questions were answered. . See orders for this visit as documented in the electronic medical record. . Patient received an After Visit Summary.  CMA or LPN served as scribe during this visit. History, Physical, and Plan performed by medical provider. The above documentation has been reviewed and is accurate and complete.  I spent >40 minutes with this patient, greater than 50% was face-to-face time  counseling regarding the above diagnoses.   Inda Coke, PA-C [place] [place] 07/25/2019  Follow-up: No follow-ups on file.

## 2019-07-26 ENCOUNTER — Other Ambulatory Visit (INDEPENDENT_AMBULATORY_CARE_PROVIDER_SITE_OTHER): Payer: BLUE CROSS/BLUE SHIELD

## 2019-07-26 DIAGNOSIS — Q8789 Other specified congenital malformation syndromes, not elsewhere classified: Secondary | ICD-10-CM | POA: Diagnosis not present

## 2019-07-26 DIAGNOSIS — E559 Vitamin D deficiency, unspecified: Secondary | ICD-10-CM

## 2019-07-26 LAB — CBC WITH DIFFERENTIAL/PLATELET
Basophils Absolute: 0.1 10*3/uL (ref 0.0–0.1)
Basophils Relative: 1 % (ref 0.0–3.0)
Eosinophils Absolute: 0.1 10*3/uL (ref 0.0–0.7)
Eosinophils Relative: 2.2 % (ref 0.0–5.0)
HCT: 40.8 % (ref 36.0–49.0)
Hemoglobin: 13.5 g/dL (ref 12.0–16.0)
Lymphocytes Relative: 35.2 % (ref 24.0–48.0)
Lymphs Abs: 2.1 10*3/uL (ref 0.7–4.0)
MCHC: 33.1 g/dL (ref 31.0–37.0)
MCV: 83.9 fl (ref 78.0–98.0)
Monocytes Absolute: 0.6 10*3/uL (ref 0.1–1.0)
Monocytes Relative: 10 % (ref 3.0–12.0)
Neutro Abs: 3 10*3/uL (ref 1.4–7.7)
Neutrophils Relative %: 51.6 % (ref 43.0–71.0)
Platelets: 193 10*3/uL (ref 150.0–575.0)
RBC: 4.87 Mil/uL (ref 3.80–5.70)
RDW: 12.5 % (ref 11.4–15.5)
WBC: 5.9 10*3/uL (ref 4.5–13.5)

## 2019-07-26 LAB — COMPREHENSIVE METABOLIC PANEL
ALT: 6 U/L (ref 0–35)
AST: 13 U/L (ref 0–37)
Albumin: 4.4 g/dL (ref 3.5–5.2)
Alkaline Phosphatase: 45 U/L — ABNORMAL LOW (ref 47–119)
BUN: 9 mg/dL (ref 6–23)
CO2: 28 mEq/L (ref 19–32)
Calcium: 9.4 mg/dL (ref 8.4–10.5)
Chloride: 105 mEq/L (ref 96–112)
Creatinine, Ser: 0.78 mg/dL (ref 0.40–1.20)
GFR: 94.49 mL/min (ref >60.00)
Glucose, Bld: 79 mg/dL (ref 70–99)
Potassium: 3.7 mEq/L (ref 3.5–5.1)
Sodium: 140 mEq/L (ref 135–145)
Total Bilirubin: 0.6 mg/dL (ref 0.2–1.2)
Total Protein: 6.7 g/dL (ref 6.0–8.3)

## 2019-07-27 ENCOUNTER — Other Ambulatory Visit: Payer: Self-pay | Admitting: Physician Assistant

## 2019-07-27 LAB — VITAMIN D 25 HYDROXY (VIT D DEFICIENCY, FRACTURES): VITD: 16.31 ng/mL — ABNORMAL LOW (ref 30.00–100.00)

## 2019-07-27 LAB — IRON,TIBC AND FERRITIN PANEL
%SAT: 15 % (calc) (ref 15–45)
Ferritin: 7 ng/mL — ABNORMAL LOW (ref 16–154)
Iron: 53 ug/dL (ref 27–164)
TIBC: 359 mcg/dL (calc) (ref 271–448)

## 2019-07-27 MED ORDER — ERGOCALCIFEROL (VITAMIN D2) 1,250 MCG (50,000 UNIT) CAPSULE
ORAL | 0 days | Status: SS
Start: 2019-07-27 — End: ?

## 2019-07-27 MED ORDER — VITAMIN D (ERGOCALCIFEROL) 1.25 MG (50000 UNIT) PO CAPS: 50000.0000 [IU] | ORAL_CAPSULE | ORAL | 0 refills | Status: DC

## 2019-08-01 NOTE — Unmapped (Signed)
Mom called to schedule clinic appt in July

## 2019-08-02 ENCOUNTER — Encounter: Admit: 2019-08-02 | Discharge: 2019-08-03 | Payer: PRIVATE HEALTH INSURANCE | Attending: Family | Primary: Family

## 2019-08-02 ENCOUNTER — Encounter
Admit: 2019-08-02 | Discharge: 2019-08-02 | Payer: PRIVATE HEALTH INSURANCE | Attending: Plastic and Reconstructive Surgery | Primary: Plastic and Reconstructive Surgery

## 2019-08-02 DIAGNOSIS — Z01818 Encounter for other preprocedural examination: Principal | ICD-10-CM

## 2019-08-02 NOTE — Unmapped (Signed)
Name:  Serafina Royals Gebhart  DOB: 04/27/2000  Today's Date: 08/02/2019  Age:  19 y.o.    Today's Visit Included:   Indication for Testing: Patient is scheduled for an upcoming procedure, surgery or treatment requiring pre-testing for COVID-19.    Informed the patient that their surgeon will be notified of results in 24-48 hours.     Advised patient to self-isolate and remain in his/her home until the surgery to minimize risk of COVID-19 infection prior to surgery.    Also advised patient to notify his/her surgeon/specialist immediately if the patient develops any new symptoms such as:  []  Subjective fever  []  Chills  []  Muscle aches  []  Runny nose  []  Sore throat  []  Loss of taste or smell   []  Cough (new or worsening of chronic cough)  []  Shortness of breath  []  Diarrhea (3 or more loose stools in the last 24 hours)  []  Fatigue (new or worsening)  []  Abdominal pain  []  Nausea or vomiting  []  Headache     Advised all household members and close contacts to remain in the house as much as possible as well.     COVID-19 testing performed.    Noralee Chars, LPN

## 2019-08-02 NOTE — Unmapped (Signed)
Addended by: Thressa Sheller B on: 08/02/2019 09:19 AM     Modules accepted: Orders

## 2019-08-02 NOTE — Unmapped (Signed)
Pre-call completed for [facility] procedure.  Pt has been instructed to take morning meds with sip of water.     NPO status, need for driver, and check-in info reviewed.  All questions addressed and pt verbalizes understanding.    Covid screening questions reviewed.  Went for covid test today, results pending.  Visitor information documented in Epic visitation tab/pre-op checklist.

## 2019-08-02 NOTE — Unmapped (Signed)
Mom requesting that both parents be able to accompany pt.  Informed her that this would be the max allowed.

## 2019-08-04 ENCOUNTER — Encounter
Admit: 2019-08-04 | Discharge: 2019-08-07 | Payer: PRIVATE HEALTH INSURANCE | Attending: Certified Registered" | Primary: Certified Registered"

## 2019-08-04 ENCOUNTER — Ambulatory Visit: Admit: 2019-08-04 | Discharge: 2019-08-07 | Payer: PRIVATE HEALTH INSURANCE

## 2019-08-04 DIAGNOSIS — Q8789 Other specified congenital malformation syndromes, not elsewhere classified: Principal | ICD-10-CM

## 2019-08-04 DIAGNOSIS — R52 Pain, unspecified: Secondary | ICD-10-CM | POA: Insufficient documentation

## 2019-08-04 DIAGNOSIS — L720 Epidermal cyst: Secondary | ICD-10-CM | POA: Diagnosis not present

## 2019-08-04 DIAGNOSIS — D481 Neoplasm of uncertain behavior of connective and other soft tissue: Secondary | ICD-10-CM | POA: Diagnosis not present

## 2019-08-04 DIAGNOSIS — R2243 Localized swelling, mass and lump, lower limb, bilateral: Secondary | ICD-10-CM | POA: Diagnosis not present

## 2019-08-04 DIAGNOSIS — Z86010 Personal history of colonic polyps: Secondary | ICD-10-CM | POA: Diagnosis not present

## 2019-08-04 DIAGNOSIS — D692 Other nonthrombocytopenic purpura: Secondary | ICD-10-CM | POA: Diagnosis not present

## 2019-08-04 DIAGNOSIS — K219 Gastro-esophageal reflux disease without esophagitis: Secondary | ICD-10-CM | POA: Diagnosis not present

## 2019-08-04 LAB — PREGNANCY TEST URINE: Choriogonadotropin (pregnancy test):PrThr:Pt:Urine:Ord:: NEGATIVE

## 2019-08-04 NOTE — Unmapped (Signed)
Assessment/Plan:    [name] is a 19 y.o. female who will undergo cryablation of chest wall desmoid tumors in Interventional Radiology.    --This procedure has been fully reviewed with the patient/patient???s authorized representative. The risks, benefits and alternatives have been explained, and the patient/patient???s authorized representative has consented to the procedure.  --The patient will accept blood products in an emergent situation.  --The patient does not have a Do Not Resuscitate order in effect.      CC: No chief complaint on file.      HPI: [name] is a 19 y.o. female with gardner syndrome and desmoid tumors who will undergo cryablation.    Allergies:   Allergies   Allergen Reactions   ??? Neomycin Swelling     Rxn after ear drops; ear swelling       Medications:   No current facility-administered medications for this encounter.       PMH:   Past Medical History:   Diagnosis Date   ??? [name]    ??? Disease of thyroid gland     multiple sub-5 mm cystic foci most compatible with colloid cysts, per Thyroid US   ??? [name] (familial adenomatous polyposis)    ??? [name]    ??? Intestinal polyps    ??? Vision problems        ASA Grade: ASA 1 - Normal healthy patient    ROS:  General: Denies fever or chills.  Cardiovascular: Denies chest pain.   Pulmonary: Denies shortness of breath, snoring, sleep apnea, or respiratory infection.    Allergies: Please see allergy section.       PE:    There were no vitals filed for this visit.    General: WD, WN female in NAD.  Airway assessment: anesthesia  Lungs: Respirations nonlabored    Casandra Doffing, MD  7:01 AM  08/04/2019

## 2019-08-04 NOTE — Unmapped (Signed)
Pediatric History and Physical      Assessment/Plan:   Active Problems:    Pain  Resolved Problems:    * No resolved hospital problems. [name] [name] is a 19 y.o. female with a history of [name] (familial adenomatous polyposis), multiple desmoid tumors, epidermal cysts and GI polyps who presents for admission following scheduled cryoablation of chest wall desmoid tumors with VIR this morning. Her pain is under control currently with scheduled Tylenol and PRN oxycodone and morphine for breakthrough pain, though she remains uncomfortable following the procedure. She is admitted to the hospital for pain control in anticipation of surgical removal of her right calf subcutaneous lesions and subcutaneous cyst superior to the left patella with plastic surgery tomorrow. She is currently scheduled for 1pm.   She requires care in the hospital for pain control.    Pain control:   - scheduled Tylenol q6  - PRN oxycodone q4 for moderate pain  - PRN morphine q3 for severe pain  - Flexeril 5 mg once tonight    FEN/GI:  - reg diet  - NPO at midnight  - mIVF  - PRN Zofran    Access: PIV    Discharge criteria: Pain control    History:   Primary Care Provider: Barnet Glasgow, MD    History provided by: mother and father    An interpreter was not used during the visit.     I have personally reviewed outside and/or ED records.     Chief Complaint: Admission for pain control and pre-op    HPI: [name] [name] is a 19 year old with a history of [name] (familial adenomatous polyposis), multiple desmoid tumors, epidermal cysts and GI polyps who presents for admission following scheduled cryoablation of chest wall desmoid tumors with VIR this morning. She has had desmoid tumors in the chest wall for several years and has had 3 prior cryoablations as well as steroid injections for these in the past. The right and left anterior chest wall desmoid tumors have caused some significant discomfort, recently right greater than left given that the left anterior chest wall desmoid tumors were previously ablated. She has also been experiencing discomfort in the right scapular and left paraspinal areas. MR chest with and without contrast showed multiple desmoid tumors in anterior and posterior thoracic wall that were stable in size. She was on sorafenib until it was discontinued on 05/12/19 due to some toxicity, but did have some shrinkage in desmoid tumors on that therapy.    [name] [name] has had multiple mass excisions by plastic surgery, most recently 12/18. She is also followed by dentistry for several jaw pits and delayed dentition, by dermatology for hair loss and by pediatric endocrinology for thyroid nodules. She has recently developed new subcutaneous lumps in the right medial and lateral calf.     Larie underwent cryoablation procedure of bilateral anterior chest wall desmoid tumors with VIR this morning and this procedure was uncomplicated with only minimal blood loss. She will be admitted for pain control and will also have surgery tomorrow with plastic surgery for removal of the subcutaneous lesions on her right calf. One of the lesions has been present for over a year and previously decreased in size while she was on chemotherapy but has now regrown and is bruising the overlying skin. The second lesion showed up about three months ago and this lesion has been stable in size.     Currently, [name] [name] reports pain described as pressure  in the right chest and difficult to describe left-sided chest pain. She notes this pain is worse when she takes deep breaths and when she moves or leans forward. She also describes lightheadedness when standing while moving from the stretcher to the bed and arm tingling/pain on the outside of her right forearm as well as pinky and ring fingers of the right hand. She denies SOB, abdominal pain, leg pain or leg weakness. She has not been able to eat or drink very much due to pain with moving to sit upright. PAST MEDICAL HISTORY:   Past Medical History:   Diagnosis Date   ??? [name]    ??? Disease of thyroid gland     multiple sub-5 mm cystic foci most compatible with colloid cysts, per Thyroid US   ??? [name] (familial adenomatous polyposis)    ??? [name]    ??? Intestinal polyps    ??? Vision problems        PAST SURGICAL HISTORY:  Past Surgical History:   Procedure Laterality Date   ??? cystis removal     ??? desmoid removal     ??? PR COLONOSCOPY W/BIOPSY SINGLE/MULTIPLE N/A 10/27/2012    Procedure: COLONOSCOPY, FLEXIBLE, PROXIMAL TO SPLENIC FLEXURE; WITH BIOPSY, SINGLE OR MULTIPLE;  Surgeon: Shirlyn Goltz Mir, MD;  Location: PEDS PROCEDURE ROOM [facility];  Service: Gastroenterology   ??? PR COLONOSCOPY W/BIOPSY SINGLE/MULTIPLE N/A 09/14/2013    Procedure: COLONOSCOPY, FLEXIBLE, PROXIMAL TO SPLENIC FLEXURE; WITH BIOPSY, SINGLE OR MULTIPLE;  Surgeon: Shirlyn Goltz Mir, MD;  Location: PEDS PROCEDURE ROOM [facility];  Service: Gastroenterology   ??? PR COLONOSCOPY W/BIOPSY SINGLE/MULTIPLE N/A 11/08/2014    Procedure: COLONOSCOPY, FLEXIBLE, PROXIMAL TO SPLENIC FLEXURE; WITH BIOPSY, SINGLE OR MULTIPLE;  Surgeon: Arnold Long Mir, MD;  Location: PEDS PROCEDURE ROOM [facility];  Service: Gastroenterology   ??? PR COLONOSCOPY W/BIOPSY SINGLE/MULTIPLE N/A 12/26/2015    Procedure: COLONOSCOPY, FLEXIBLE, PROXIMAL TO SPLENIC FLEXURE; WITH BIOPSY, SINGLE OR MULTIPLE;  Surgeon: Arnold Long Mir, MD;  Location: PEDS PROCEDURE ROOM [facility];  Service: Gastroenterology   ??? PR COLONOSCOPY W/BIOPSY SINGLE/MULTIPLE N/A 09/02/2017    Procedure: COLONOSCOPY, FLEXIBLE, PROXIMAL TO SPLENIC FLEXURE; WITH BIOPSY, SINGLE OR MULTIPLE;  Surgeon: Arnold Long Mir, MD;  Location: PEDS PROCEDURE ROOM [facility];  Service: Gastroenterology   ??? PR COLSC FLX W/REMOVAL LESION BY HOT BX FORCEPS N/A 08/27/2016    Procedure: COLONOSCOPY, FLEXIBLE, PROXIMAL TO SPLENIC FLEXURE; W/REMOVAL TUMOR/POLYP/OTHER LESION, HOT BX FORCEP/CAUTE;  Surgeon: Arnold Long Mir, MD;  Location: PEDS PROCEDURE ROOM [facility]; Service: Gastroenterology   ??? PR COLSC FLX W/RMVL OF TUMOR POLYP LESION SNARE TQ N/A 02/25/2019    Procedure: COLONOSCOPY FLEX; W/REMOV TUMOR/LES BY SNARE;  Surgeon: Helyn Numbers, MD;  Location: GI PROCEDURES [place] [facility];  Service: Gastroenterology   ??? PR EXC SKIN BENIG 2.1-3 CM TRUNK,ARM,LEG Right 02/25/2017    Procedure: EXCISION, BENIGN LESION INCLUDE MARGINS, EXCEPT SKIN TAG, LEGS; EXCISED DIAMETER 2.1 TO 3.0 CM;  Surgeon: Clarene Duke, MD;  Location: CHILDRENS OR [facility];  Service: Plastics   ??? PR EXC SKIN BENIG 3.1-4 CM TRUNK,ARM,LEG Right 02/25/2017    Procedure: EXCISION, BENIGN LESION INCLUDE MARGINS, EXCEPT SKIN TAG, ARMS; EXCISED DIAMETER 3.1 TO 4.0 CM;  Surgeon: Clarene Duke, MD;  Location: CHILDRENS OR [facility];  Service: Plastics   ??? PR EXC SKIN BENIG >4 CM FACE,FACIAL Right 02/25/2017    Procedure: EXCISION, OTHER BENIGN LES INCLUD MARGIN, FACE/EARS/EYELIDS/NOSE/LIPS/MUCOUS MEMBRANE; EXCISED DIAM >4.0 CM;  Surgeon: Clarene Duke, MD;  Location: CHILDRENS OR  [facility];  Service: Plastics   ??? PR UPPER GI ENDOSCOPY,BIOPSY N/A 10/27/2012    Procedure: UGI ENDOSCOPY; WITH BIOPSY, SINGLE OR MULTIPLE;  Surgeon: Shirlyn Goltz Mir, MD;  Location: PEDS PROCEDURE [facility];  Service: Gastroenterology   ??? PR UPPER GI ENDOSCOPY,BIOPSY N/A 09/14/2013    Procedure: UGI ENDOSCOPY; WITH BIOPSY, SINGLE OR MULTIPLE;  Surgeon: Shirlyn Goltz Mir, MD;  Location: PEDS PROCEDURE ROOM [facility];  Service: Gastroenterology   ??? PR UPPER GI ENDOSCOPY,BIOPSY N/A 11/08/2014    Procedure: UGI ENDOSCOPY; WITH BIOPSY, SINGLE OR MULTIPLE;  Surgeon: Arnold Long Mir, MD;  Location: PEDS PROCEDURE ROOM [facility];  Service: Gastroenterology   ??? PR UPPER GI ENDOSCOPY,BIOPSY N/A 12/26/2015    Procedure: UGI ENDOSCOPY; WITH BIOPSY, SINGLE OR MULTIPLE;  Surgeon: Arnold Long Mir, MD;  Location: PEDS PROCEDURE ROOM [facility];  Service: Gastroenterology   ??? PR UPPER GI ENDOSCOPY,BIOPSY N/A 08/27/2016    Procedure: UGI ENDOSCOPY; WITH BIOPSY, SINGLE OR MULTIPLE;  Surgeon: Arnold Long Mir, MD;  Location: PEDS PROCEDURE ROOM [facility];  Service: Gastroenterology   ??? PR UPPER GI ENDOSCOPY,BIOPSY N/A 09/02/2017    Procedure: UGI ENDOSCOPY; WITH BIOPSY, SINGLE OR MULTIPLE;  Surgeon: Arnold Long Mir, MD;  Location: PEDS PROCEDURE ROOM [facility];  Service: Gastroenterology   ??? TUMOR REMOVAL         FAMILY HISTORY:  Family History   Problem Relation Age of Onset   ??? No Known Problems Mother    ??? No Known Problems Father    ??? No Known Problems Sister    ??? No Known Problems Brother    ??? No Known Problems Maternal Aunt    ??? No Known Problems Maternal Uncle    ??? No Known Problems Paternal Aunt    ??? No Known Problems Paternal Uncle    ??? Stroke Maternal Grandmother    ??? Other Maternal Grandmother         benign lesions of liver and pancreas, further details unknown   ??? No Known Problems Maternal Grandfather    ??? No Known Problems Paternal Grandmother    ??? No Known Problems Paternal Grandfather    ??? Anesthesia problems Neg Hx    ??? Broken bones Neg Hx    ??? Cancer Neg Hx    ??? Clotting disorder Neg Hx    ??? Collagen disease Neg Hx    ??? Diabetes Neg Hx    ??? Dislocations Neg Hx    ??? Fibromyalgia Neg Hx    ??? Gout Neg Hx    ??? Hemophilia Neg Hx    ??? Osteoporosis Neg Hx    ??? Rheumatologic disease Neg Hx    ??? Scoliosis Neg Hx    ??? Severe sprains Neg Hx    ??? Sickle cell anemia Neg Hx    ??? Spinal Compression Fracture Neg Hx    ??? Melanoma Neg Hx    ??? Basal cell carcinoma Neg Hx    ??? Squamous cell carcinoma Neg Hx        SOCIAL HISTORY:  Lives in [place] at [place].  Is primarily in college / [employer]. 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       Physical:   Vital signs: BP 123/76  -  Pulse 63  - Temp 36.5 ??C (Oral)  - Resp 13  - Ht 165.1 cm (5' 5)  - Wt 50.3 kg (111 lb)  - SpO2 100%  - BMI 18.47 kg/m??   Vitals:    08/04/19 0715   Weight: 50.3 kg (111 lb)   , 17 %ile (Z= -0.96) based on CDC (Girls, 2-20 Years) weight-for-age data using vitals from 08/04/2019.   Ht Readings from Last 1 Encounters:   08/04/19 165.1 cm (5' 5) (61 %, Z= 0.28)*     * Growth percentiles are based on CDC (Girls, 2-20 Years) data.   , 61 %ile (Z= 0.28) based on CDC (Girls, 2-20 Years) Stature-for-age data based on Stature recorded on 08/04/2019.  HC Readings from Last 1 Encounters:   09/17/16 55.1 cm (21.69) (62 %, Z= 0.31)*     * Growth percentiles are based on [place] (Girls, 2-18 years) data.    No head circumference on file for this encounter.  Body mass index is 18.47 kg/m??., 10 %ile (Z= -1.28) based on CDC (Girls, 2-20 Years) BMI-for-age based on BMI available as of 08/04/2019.    General:   alert, active, in no acute distress, sitting upright in bed in no apparent discomfort  Head:  atraumatic and normocephalic  Eyes:   conjunctiva clear  Ears:   not examined  Nose:   clear, no discharge  Oropharynx:   moist mucous membranes without erythema, exudates or petechiae  Neck:   full range of motion  Lungs:   clear to auscultation, no wheezing, crackles or rhonchi, breathing unlabored, shallow breathing limited by pain  Heart:   Normal PMI. regular rate and rhythm, normal S1, S2, no murmurs or gallops.  Abdomen:   Abdomen soft, non-tender.  BS normal. No masses, organomegaly  Neuro:   Sluggish movement of fingers of right hand. Normal sensation of right hand and right arm. R arm movement limited by pain. Otherwise neuro exam within normal limits.  Chest/Spine:   chest wall tenderness, swelling on left side of chest  Skin:   skin color, texture and turgor are normal; no bruising, rashes or lesions noted    Labs/Studies:  Labs and Studies from the last 24hrs per EMR and Reviewed

## 2019-08-04 NOTE — Unmapped (Signed)
[facility] Interventional Radiology  Post-procedure Note    Patient: [name] [name]    DOB: 2000-07-02  Medical Record Number: [id number]   Note Date/Time: Aug 04, 2019 10:30 AM     Procedure: Cryoablation of anterior chest wall desmoid tumors    Diagnosis:  Gardener syndrome    Attending: Dr. Braulio Conte     Trainee: Dr. Milana Huntsman    Time out: Prior to the procedure, a time out was performed with all team members present. During the time out, the patient, procedure and procedure site when applicable were verbally verified by the team members.     Complications: NONE    Sedation: General Anesthesia     EBL: Minimal    Samples:  None    Brief Description:    Successful cryoablation of bilateral anterior chest wall desmoid tumors.    Plan:   - admission for pain control and surgery for tomorrow with plastics.     Full report to follow in PACS.    Milana Huntsman, MD  Aug 04, 2019 10:30 AM

## 2019-08-05 DIAGNOSIS — J939 Pneumothorax, unspecified: Secondary | ICD-10-CM | POA: Diagnosis not present

## 2019-08-05 DIAGNOSIS — L720 Epidermal cyst: Secondary | ICD-10-CM | POA: Diagnosis not present

## 2019-08-05 DIAGNOSIS — D481 Neoplasm of uncertain behavior of connective and other soft tissue: Secondary | ICD-10-CM | POA: Diagnosis not present

## 2019-08-05 DIAGNOSIS — R2243 Localized swelling, mass and lump, lower limb, bilateral: Secondary | ICD-10-CM | POA: Diagnosis not present

## 2019-08-05 DIAGNOSIS — Q8789 Other specified congenital malformation syndromes, not elsewhere classified: Secondary | ICD-10-CM | POA: Diagnosis not present

## 2019-08-05 DIAGNOSIS — R52 Pain, unspecified: Secondary | ICD-10-CM | POA: Diagnosis not present

## 2019-08-05 DIAGNOSIS — Z86010 Personal history of colonic polyps: Secondary | ICD-10-CM | POA: Diagnosis not present

## 2019-08-05 DIAGNOSIS — K219 Gastro-esophageal reflux disease without esophagitis: Secondary | ICD-10-CM | POA: Diagnosis not present

## 2019-08-05 DIAGNOSIS — D692 Other nonthrombocytopenic purpura: Secondary | ICD-10-CM | POA: Diagnosis not present

## 2019-08-05 MED ORDER — OXYCODONE 5 MG TABLET
ORAL_TABLET | Freq: Four times a day (QID) | ORAL | 0 refills | 2.00000 days | PRN
Start: 2019-08-05 — End: 2019-08-10

## 2019-08-05 MED ORDER — CEPHALEXIN 500 MG CAPSULE
ORAL_CAPSULE | Freq: Three times a day (TID) | ORAL | 0 refills | 3.00000 days
Start: 2019-08-05 — End: 2019-08-08

## 2019-08-05 NOTE — Unmapped (Signed)
Note copied from most recent clinical encounter for OR purposes. Will repeat exam in the preoperative area.          Pediatric Plastic Surgery  Patient Video Visit    I am located on-site and the patient is located off-site for this visit.       Assessment/Plan:   There are no diagnoses linked to this encounter.       [name] [name] is a 19 y.o. female with [name] and multiple desmoid tumors    - Discussed excision of the two lesions on her calf as an outpatient procedure  - Will plan to proceed to the OR on 08/05/19. Risks and benefits of surgery were discussed, including but not limited to bleeding, infection, anesthesia, scarring, damage to surrounding structures and need for further procedures. Questions were answered and informed consent will be obtained the day of the procedure.   - Encouraged to call the [facility] Plastic Surgery office with any questions or concerns.    Subjective     History of Present Illness: [name] [name] is a 19 y.o. 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 She is followed by Dr. Ciro Backer and Dr. Selena Batten, as well as Dr. Lenise Arena. She presents today to discuss excision of two new areas on the right calf. One lesion has been present for over a year and went down in size with chemotherapy. It has since regrown and gets large enough to bruise the overlying skin. She has another lesion that popped up about a month ago, which has been stable in size. She desires excision of both lesions. Dr. Selena Batten is planning cryoablation on 5/27 and she will be admitted overnight .       No current facility-administered medications for this visit.  No current outpatient medications on file.    Facility-Administered Medications Ordered in Other Visits:   ???  acetaminophen (TYLENOL) tablet 1,000 mg, 1,000 mg, Oral, Q6H SCH, Rafael Bihari, MD, 1,000 mg at 08/04/19 1826  ???  bacitracin ointment, , Topical, BID, Janine Ores, MD  ???  cyclobenzaprine (FLEXERIL) tablet 5 mg, 5 mg, Oral, Once, Shon Baton, MD  ???  docusate sodium (COLACE) capsule 100 mg, 100 mg, Oral, Daily, Shon Baton, MD  ???  MORPhine injection 2 mg, 2 mg, Intravenous, Q3H PRN, Rafael Bihari, MD, 2 mg at 08/04/19 1616  ???  ondansetron (ZOFRAN-ODT) disintegrating tablet 4 mg, 4 mg, Oral, Q12H PRN, Shon Baton, MD  ???  oxyCODONE (ROXICODONE) immediate release tablet 5 mg, 5 mg, Oral, Q4H PRN **OR** oxyCODONE (ROXICODONE) immediate release tablet 10 mg, 10 mg, Oral, Q4H PRN, Rafael Bihari, MD, 10 mg at 08/04/19 1746  ???  sodium chloride (NS) 0.9 % infusion, 100 mL/hr, Intravenous, Continuous, Shon Baton, MD, Last Rate: 100 mL/hr at 08/04/19 1751, 100 mL/hr at 08/04/19 1751    Allergies   Allergen Reactions   ??? Neomycin Swelling     Rxn after ear drops; ear swelling       Social History  Accompanied by mom for this video visit.    ROS  See HPI. All other systems reviewed are negative.    Physical Exam   NAD  Right lateral calf: ~3x3cm subcutaneous lesion, appears firm and well defined, adjacent bruising of the skin  Right medial calf: ~1x1cm subcutaneous mass, well defined, round      Approximately 10 min was spent on this medically necessary service. Pt visit by video visit in  the setting of State of Emergency due to COVID-19 Pandemic.         I spent 5 minutes on the real-time audio and video with the patient on the date of service. I spent an additional 5 minutes on pre- and post-visit activities on the date of service.     The patient was physically located in [place] or a state in which I am permitted to provide care. The patient and/or parent/guardian understood that s/he may incur co-pays and cost sharing, and agreed to the telemedicine visit. The visit was reasonable and appropriate under the circumstances given the patient's presentation at the time.    The patient and/or parent/guardian has been advised of the potential risks and limitations of this mode of treatment (including, but not limited to, the absence of in-person examination) and has agreed to be treated using telemedicine. The patient's/patient's family's questions regarding telemedicine have been answered.     If the visit was completed in an ambulatory setting, the patient and/or parent/guardian has also been advised to contact their provider???s office for worsening conditions, and seek emergency medical treatment and/or call 911 if the patient deems either necessary.

## 2019-08-05 NOTE — Unmapped (Signed)
Inpatient Pediatric Nutrition Consult Note      Reason for Consult:   Visit Type: RN Consult  Reason for Visit: Assessment, Per RN screen (decreased appetite)    History of Present Illness: Pt is a 19 yr old female with hx of [name] and multiple desmoid tumors. She was admitted for planned excision of lower extremity lesions with plastic surgery. She will remains admitted s/p procedure for pain management.     Anthropometrics:  Current Wt: 50.3 kg (111 lb) 17 %ile (Z= -0.96) based on CDC (Girls, 2-20 Years) weight-for-age data using vitals from 08/04/2019.  Length or Height: 165.1 cm (5' 5) 61 %ile (Z= 0.28) based on CDC (Girls, 2-20 Years) Stature-for-age data based on Stature recorded on 08/04/2019.  Current BMI:  Body mass index is 18.47 kg/m??. 10 %ile (Z= -1.28) based on CDC (Girls, 2-20 Years) BMI-for-age based on BMI available as of 08/04/2019.     Admission Wt: 50.3 kg (111 lb)  Ideal Body Wt: 59 kg (BMI/age at 50%ile)     Growth velocity/trends: -200 gm since 06/10/19    Nutrition-Focused Physical Exam:    Nutrition Evaluation  Overall Impressions: Unable to perform Nutrition-Focused Physical Exam at this time due to (comment) (Pt in PACU) (08/05/19 1448)    Nutritionally Relevant Data:  Meds: Nutritionally-relevant medications reviewed.   IV Fluids:  LR @ KVO, NS @ 100 mL/hr  Labs: No nutrition-pertinent labs available for review thus far this admission.   Procedures: s/p excision of lower extremity lesions    Emesis: none documented thus far this admission  Stools: none documented thus far this admission  UOP: 1.2 mL/kg/hr     Home Nutrition History:  Food Allergies/Intolerances: NKFA per chart review     PO Diet:  Regular diet ad lib. Unable to confirm specifics of diet as pt is still in PACU.     Current Medical Nutrition Therapy:  Enteral/Parenteral Access: PIV    Pt is currently NPO    Estimated Nutrient Needs:  Nutrition Calculation Weight: Current  Calculation Method: Catch up growth Calculation Consideration : Catch up growth    Estimated Energy Needs:  (40 kcal/kg)  Total Protein Estimated Needs: 1-1.5 grams/kg  Total Fluid Estimated Needs: Per Holliday-Segar (42 mL/kg or per medical team)    Weight and Growth Goal:  Desired Growth Pattern: Prevent wt loss during hospitalization    Pediatric AND/ASPEN Malnutrition Screening:  Primary Indicator of Malnutrition  Body Mass Index (BMI) for age z-score (43-16 years of age): -1 to -1.9, indicating MILD protein-calorie malnutrition    Chronicity: Chronic (>/equal to 3 months)    Etiology  Illness-related: Decreased nutrient intake    Overall Impression: Patient meets criteria for MILD protein-calorie malnutrition (08/05/19 1442)    Nutrition Assessment:  Pt presents with decreased appetite and hx of low weight/age. Current BMI/age z-score meets criteria for mild malnutrition. Unable to assess home diet as pt is still in PACU. Pt is currently NPO for procedure; once diet is advanced she would likely benefit from oral nutrition supplement if she continues to have poor appetite. MVI indicated to promote wound healing and if pt has poor PO intake.     Nutrition Interventions/Recommendations:   ?? Advance oral diet as medically feasible.   ?? Once diet is advanced, recommend Ensure Plus BID.   ?? Recommend adult MVI with minerals 1 tablet/day once diet is advanced.   ?? Daily weights.   ?? Service RD will continue to monitor hospital course and update  nutrition plan of care as needed.     Loraine Maple, MPH, RD, LDN, CNSC  Pager: [phone]

## 2019-08-05 NOTE — Unmapped (Signed)
Pediatric Daily Progress Note     Assessment/Plan:     Active Problems:    [name]    [name]    Pain  Resolved Problems:    * No resolved hospital problems. *    [name] [name] is a 19 y.o. female  with a history of [name] (familial adenomatous polyposis), multiple desmoid tumors, epidermal cysts and GI polyps who presents for admission following scheduled cryoablation of chest wall desmoid tumors with VIR on 5/27 and will go to the OR today for removal of lower extremity lesions with plastics on 5/28. She likely require continued inpatient management for pain control.    Pain control:  - scheduled Tylenol q6  - PRN oxycodone q4 or moderate pain  - PRN morphine q3 for severe pain    FEN/GI:  - NPO for OR  - mIVF  - PRN Zofran    Access: PIV    Discharge Criteria: Pain control    Plan of care discussed with caregiver(s) at bedside.      Subjective:     Interval History: Overnight, vital signs remained stable. She continued to be uncomfortable with continued pain, which is worsened with movement. Patient received scheduled tylenol, two doses of morphine and one dose of oxycodone for pain. Flexeril was given in the evening for muscle pain, which allowed for 30 minutes of sleep. Patient received zofran for nausea and bacitracin was added to medications for wound care.    Objective:     Vital signs in last 24 hours:  Temp:  [36.4 ??C (97.5 ??F)-36.8 ??C (98.2 ??F)] 36.8 ??C (98.2 ??F)  Heart Rate:  [58-73] 69  SpO2 Pulse:  [63] 63  Resp:  [13-19] 18  BP: (93-123)/(52-76) 117/67  MAP (mmHg):  [65-90] 74  SpO2:  [96 %-100 %] 99 %  Intake/Output last 3 shifts:  I/O last 3 completed shifts:  In: 2048 [I.V.:2048]  Out: 1450 [Urine:1450]    Physical Exam:  General: tired appearing female, displays signs of pain with exam maneuvers and movement  Head: atraumatic and normocephalic  Eyes: conjunctiva clear  Nose: clear, no discharge  Oropharynx: moist mucous membranes  CV: regular rate and rhythm Chest: two bandages over surgical sites (right and left); left surgical site has blood on bandage and swelling and erythema surrounding the site; right surgical site with no blood or swelling  Pulm: clear to auscultation of frontal upper lobes and right side middle lobe, shallow breathing limited by pain  Abdomen: normal bowel sounds, no tenderness, rebound, or guarding  Neuro: sluggish movement of fingers of right hand; left arm movement limited by pain  Skin: skin texture and turgor normal; no bruising or rashes noted    Labs/Studies:  Labs and Studies from the last 24hrs per EMR and Reviewed  ========================================  Higinio Roger, MS3    I attest that I have reviewed the student note and that the components of the history of the present illness, the physical exam, and the assessment and plan documented were performed by me or were performed in my presence by the student where I verified the documentation and performed (or re-performed) the exam and medical decision making.     Madison Hickman, MD  Pediatrics, PGY-1  Pager: [phone]

## 2019-08-05 NOTE — Unmapped (Incomplete Revision)
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  She has had previous surgical excisions of masses as well as desmoid tumors.  She underwent cryoablation of two chest wall desmoid tumors on 5/27 and she presented for planned excision of two right lower extremity masses (one was noted to be a cyst preoperatively, as it was ruptured due to infection).  In the preoperative area she noted a left knee small subcentimeter area as well as a left sided neck cyst.  Due to the positionality of the knee area, this was not excised. The remainders were confirmed and marked.    Description of the Procedure: [name] was identified in the preoperative holding area, where her history and physical exam were updated.  She was marked in the areas of concern, including the new left sided neck lesion, and was then transported back to the operating room where she was positioned supine on the OR table.  A time out was called and the appropriate patient, procedure, and laterality were confirmed.  General endotracheal anesthesia was then induced via the anesthesia team.  Preoperative antibiotics were assured to be administered.  All pressure points were assured to be padded.  She was test sat up, and assured to be centrally positioned on the table.  The neck and lower extremity were prepped and draped in the usual sterile fashion.    All areas were infiltrated with local anesthesia with epinephrine.  This was allowed to take effect.  Attention was first turned to the medial of the lower two extremity lesions.  This was a ruptured/previously infected cyst.  The cyst was still visible.  This was excised, with a 15 blade scalpel, as was the surrounding capsule, inclusive of overlying skin.  This was passed off the table as specimen.  The area was irrigated and minor bleeding points were halted with electrocautery. The deep dermis was approximated with 4-0 vicryl in a simple buried fashion.  The skin was friable due to previous infection, and therefore simple interrupted sutures were placed to approximate the epidermis.    Attention was then turned to the neck lesion.  A 15 blade scalpel was used to excise the lesion, which was passed off the table as specimen.  Minor bleeding points were halted with electrocautery.  The

## 2019-08-05 NOTE — Unmapped (Signed)
Care Management  Initial Transition Planning Assessment    Per H&P: [name] [name] is a 19 y.o. 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 She is followed by Dr. Ciro Backer and Dr. Selena Batten, as well as Dr. Lenise Arena. She presents today to discuss excision of two new areas on the right calf. One lesion has been present for over a year and went down in size with chemotherapy. It has since regrown and gets large enough to bruise the overlying skin. She has another lesion that popped up about a month ago, which has been stable in size. She desires excision of both lesions. Dr. Selena Batten is planning cryoablation on 5/27 and she will be admitted overnight               General  Care Manager assessed the patient by : In person interview with family, Medical record review, Discussion with Clinical Care team    CM met with patient's mother and father in pt room, patient present but asleep during discussion. Pt/visitors were not wearing hospital provided masks for the duration of the interaction with CM. CM was wearing hospital provided surgical mask and hospital provided eye protection.  CM was not within 6 foot of the patient/visitors during this interaction.     Orientation Level: Other (Comment) pt sleeping  Functional level prior to admission: Independent. Per parents, patient is a Holiday representative at [employer], Glass blower/designer in nursing. Patient is currently out of school and at home with parents during the summer. Mother states that patient does not have trouble getting around at home and has not needed therapies or equipment. Patient is able to drive on her own at baseline. Mother states that patient's bedroom is on the second level of the home but patient will likely stay down stairs and sleep on the couch while recovering, she is access to bathroom on ground level.     Reason for referral: Discharge Planning       Medical Provider(s): Jarold Motto, [place], RD at [facility] in [place]    Reason for Admission: Admitting Diagnosis:  [name] [Q87.89]  [name] [D48.1]  Past Medical History:   has a past medical history of [name], Disease of thyroid gland, [name] (familial adenomatous polyposis), [name], Intestinal polyps, and Vision problems.  Past Surgical History:   has a past surgical history that includes desmoid removal; cystis removal; Tumor removal; pr upper gi endoscopy,biopsy (N/A, 10/27/2012); pr colonoscopy w/biopsy single/multiple (N/A, 10/27/2012); pr upper gi endoscopy,biopsy (N/A, 09/14/2013); pr colonoscopy w/biopsy single/multiple (N/A, 09/14/2013); pr upper gi endoscopy,biopsy (N/A, 11/08/2014); pr colonoscopy w/biopsy single/multiple (N/A, 11/08/2014); pr upper gi endoscopy,biopsy (N/A, 12/26/2015); pr colonoscopy w/biopsy single/multiple (N/A, 12/26/2015); pr upper gi endoscopy,biopsy (N/A, 08/27/2016); pr colsc flx w/removal lesion by hot bx forceps (N/A, 08/27/2016); pr exc skin benig >4 cm face,facial (Right, 02/25/2017); pr exc skin benig 3.1-4 cm trunk,arm,leg (Right, 02/25/2017); pr exc skin benig 2.1-3 cm trunk,arm,leg (Right, 02/25/2017); pr upper gi endoscopy,biopsy (N/A, 09/02/2017); pr colonoscopy w/biopsy single/multiple (N/A, 09/02/2017); and pr colsc flx w/rmvl of tumor polyp lesion snare tq (N/A, 02/25/2019).   Previous admit date: N/A    Primary Insurance- Payor: BCBS / Plan: BCBS BLUE OPTIONS/PPO/ADV ([place] ONLY) / Product Type: *No Product type* /   Secondary Insurance ??? None  Prescription Coverage ??? BCBS  [facility] - [phone] [place], [place] - [address] AT [address] [facility] [address]  [facility]    Transportation home: Private vehicle  Contact/Decision Maker  Extended Emergency Contact Information  Primary Emergency Contact: Koelling,[name]   [place] Phone: [phone]  Relation: Mother  Secondary Emergency Contact: Leonor,Jeff  Address: [address] [place], [place] [address] [place]  Mobile Phone: [phone]  Relation: Father    Patient Phone Number: [phone]    Legal Next of Kin / Guardian / POA / Advance Directives       HCDM [facility] policy, no known patient preference): Puls,[name] - Mother - [phone]    HCDM [facility] policy, no known patient preference): [name] - Father - [phone]    (patient asleep post-op and unable to confirm preference of HCDM)    Advance Directive (Medical Treatment)  Does patient have an advance directive covering medical treatment?: Patient does not have advance directive covering medical treatment.    Health Care Decision Maker [HCDM] (Medical & Mental Health Treatment)  Healthcare Decision Maker: Patient does not wish to appoint a Health Care Decision Maker at this time  Information offered on HCDM, Medical & Mental Health advance directives:: Patient declined information.    Patient Information  Lives with: Parents when not on campus at school. Patient has older siblings that live outside of the home.    Type of Residence: Private residence  [address] [place] [address]    Support Systems/Concerns: Family Members, Parent    Responsibilities/Dependents at home?: No    Home Care services in place prior to admission?: No    Equipment Currently Used at Home: none     Currently receiving outpatient dialysis?: No     Financial Information     Need for financial assistance?: No     Social Determinants of Health  Social Determinants of Health were addressed in provider documentation.  Please refer to patient history.    Discharge Needs Assessment  Concerns to be Addressed: no discharge needs identified, denies needs/concerns at this time    Clinical Risk Factors:      Barriers to taking medications: No    Prior overnight hospital stay or ED visit in last 90 days: No    Readmission Within the Last 30 Days: no previous admission in last 30 days    Anticipated Changes Related to Illness: none    Equipment Needed After Discharge: none    Discharge Facility/Level of Care Needs: other (see comments) patient is expected to discharge to home with parents    Readmission  Risk of Unplanned Readmission Score:  %  Predictive Model Details   No score data available for [facility] Risk of Unplanned Readmission     Readmitted Within the Last 30 Days? (No if blank)   Patient at risk for readmission?: No    Discharge Plan  Screen findings are: Care Manager reviewed the plan of the patient's care with the Multidisciplinary Team. No discharge planning needs identified at this time. Care Manager will continue to manage plan and monitor patient's progress with the team.    Expected Discharge Date: 08/06/2019 pending pain control    Quality data for continuing care services shared with patient and/or representative?: N/A  Patient and/or family were provided with choice of facilities / services that are available and appropriate to meet post hospital care needs?: N/A     Initial Assessment complete?: Yes      Alric Quan, RN BSN  Care Manager  [phone]  Pager [phone]    Evening/Weekend Care Management Coverage:  ??? M-F 5-7 pm: ED Care Manager  Page: [phone] or Call [phone] and ask for the Care Manager  ??? Sat/Sun 8:30-5pm: Page [phone]   ??? Sat/Sun 5pm-7pm: Page [phone]  ??? Evenings 7pm-8 am and Weekends 7pm-8am: On Call Care Manager: [phone]

## 2019-08-05 NOTE — Unmapped (Addendum)
Diedre Maclellan is a 19 year old with a history of [name] (familial adenomatous polyposis), multiple desmoid tumors, epidermal cysts and GI polyps who presented for admission following scheduled cryoablation of chest wall desmoid tumors with VIR and excision of subcutaneous lesions with plastic surgery the following day.     Pain control:  Following the procedure with VIR, which was well tolerated and uncomplicated, Rhandi's pain was managed with scheduled Tylenol, PRN oxycodone, PRN morphine and a one-time dose of Flexeril due to complaint of muscle spasms. She continued to have pain and discomfort overnight. After the procedure with plastics, medications were continued as above and Flexeril was increased to 10 mg q8 PRN. She continued to have pain throughout the following day and received several additional doses of oxycodone and Flexeril. She was started on gabapentin 100 mg TID due to concern for neuropathic pain, which she described as pins and needles, along her right arm to her hand. She was discharged on a regimen of scheduled Tylenol and ibuprofen along with ***    FEN/GI:  Between the procedures, [place] was maintained on a regular diet up until midnight the night before the procedure with plastic surgery. However, she did not eat or drink very much due to pain with sitting up in bed, so she was started on maintenance fluids. Patient experienced nausea and was given Zofran. After the procedures, [place] was moved to a regular diet with continued Zofran PRN.

## 2019-08-05 NOTE — Unmapped (Signed)
Remained afebrile throughout the night, complaint of pain to chest surgical sites. Required two doses of morphine and one dose of oxycodone along with scheduled tylenol. Rested for a few hours after medications administered. Complained of nausea after ambulating, a dose of Zofran provided with little relief, MD made aware and phenergan ordered, verbalized nausea was better before phenergan could be administered. Mother remains at bedside providing comfort and asking appropriate questions, aware of NPO status for procedure today. Will continue with POC.  Problem: Adult Inpatient Plan of Care  Goal: Plan of Care Review  Outcome: Progressing  Goal: Patient-Specific Goal (Individualization)  Outcome: Progressing  Goal: Absence of Hospital-Acquired Illness or Injury  Outcome: Progressing  Goal: Optimal Comfort and Wellbeing  Outcome: Progressing  Goal: Readiness for Transition of Care  Outcome: Progressing  Goal: Rounds/Family Conference  Outcome: Progressing     Problem: Wound  Goal: Optimal Wound Healing  Outcome: Progressing

## 2019-08-05 NOTE — Unmapped (Addendum)
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  She has had previous surgical excisions of masses as well as desmoid tumors.  She underwent cryoablation of two chest wall desmoid tumors on 5/27 and she presented for planned excision of two right lower extremity masses (one was noted to be a cyst preoperatively, as it was ruptured due to infection).  In the preoperative area she noted a left knee small subcentimeter area as well as a left sided neck cyst.  Due to the positionality of the knee area, this was not excised. The remainders were confirmed and marked.    Description of the Procedure: [name] was identified in the preoperative holding area, where her history and physical exam were updated.  She was marked in the areas of concern, including the new left sided neck lesion, and was then transported back to the operating room where she was positioned supine on the OR table.  A time out was called and the appropriate patient, procedure, and laterality were confirmed.  General endotracheal anesthesia was then induced via the anesthesia team.  Preoperative antibiotics were assured to be administered.  All pressure points were assured to be padded.  She was test sat up, and assured to be centrally positioned on the table.  The neck and lower extremity were prepped and draped in the usual sterile fashion.    All areas were infiltrated with local anesthesia with epinephrine.  This was allowed to take effect.  Attention was first turned to the medial of the lower two extremity lesions.  This was a ruptured/previously infected cyst.  The cyst was still visible.  This was excised, with a 15 blade scalpel, as was the surrounding capsule, inclusive of overlying skin.  This was passed off the table as specimen.  The area was irrigated and minor bleeding points were halted with electrocautery. The deep dermis was approximated with 4-0 vicryl in a simple buried fashion.  The skin was friable due to previous infection, and therefore simple interrupted sutures were placed to approximate the epidermis.    Attention was then turned to the neck lesion.  A 15 blade scalpel was used to excise the lesion, which was passed off the table as specimen.  Minor bleeding points were halted with electrocautery.  The deep dermal layers were approximated with 4-0 vicryl in a simple buried fashion.  The skin edges were approximated with 4-0 monocryl.    The focus was then turned to the lateral lower extremity lesion.  This was excised, with a 15 blade scalpel, as was the surrounding capsule, inclusive of overlying skin.  This was passed off the table as specimen.  Clinically this appeared to be a cyst.  The area was irrigated and minor bleeding points were halted with electrocautery. The deep dermis was approximated with 4-0 vicryl in a simple buried fashion.  The skin edges were  approximated with 4-0 monocryl.    Dressings of steristrips were applied to the lateral leg and neck lesion.  Due to the previous infection in the medial leg, this was covered with xeroform.  A dressing of ABD pads, kerlix roll, and an ACE wrap was then applied.  The patient was awakened from anesthesia and transported to the PACU in stable condition.  All instrument counts were correct at the conclusion of the case.    Parents disclosed that during the cryoablation that there was entry in to the pleural space but did not think this extended to the lung.  Due to the patient being on PPV a CXR was obtained, and was without abnormality.

## 2019-08-06 DIAGNOSIS — Z86010 Personal history of colonic polyps: Secondary | ICD-10-CM | POA: Diagnosis not present

## 2019-08-06 DIAGNOSIS — R2243 Localized swelling, mass and lump, lower limb, bilateral: Secondary | ICD-10-CM | POA: Diagnosis not present

## 2019-08-06 DIAGNOSIS — D692 Other nonthrombocytopenic purpura: Secondary | ICD-10-CM | POA: Diagnosis not present

## 2019-08-06 DIAGNOSIS — R52 Pain, unspecified: Secondary | ICD-10-CM | POA: Diagnosis not present

## 2019-08-06 DIAGNOSIS — L720 Epidermal cyst: Secondary | ICD-10-CM | POA: Diagnosis not present

## 2019-08-06 DIAGNOSIS — K219 Gastro-esophageal reflux disease without esophagitis: Secondary | ICD-10-CM | POA: Diagnosis not present

## 2019-08-06 NOTE — Unmapped (Signed)
No acute events today. Surgery completed this afternoon.     CV: VSS and afebrile    N: WDL. Oxy 10mg  given 1x.    R: Clear - RA     GI/GU: Voiding. No BM today. Eating dinner - cheeseburger and mac and cheese.     S: Dressings CDI with some drainage. Heating pad given to patient.     PIV continues to infuse IV fluids. Dressing CDI. Parents at bedside and active in care. Will continue to monitor.       Problem: Adult Inpatient Plan of Care  Goal: Plan of Care Review  Outcome: Ongoing - Unchanged  Goal: Patient-Specific Goal (Individualization)  Outcome: Ongoing - Unchanged  Goal: Absence of Hospital-Acquired Illness or Injury  Outcome: Ongoing - Unchanged  Goal: Optimal Comfort and Wellbeing  Outcome: Ongoing - Unchanged  Goal: Readiness for Transition of Care  Outcome: Ongoing - Unchanged  Goal: Rounds/Family Conference  Outcome: Ongoing - Unchanged     Problem: Wound  Goal: Optimal Wound Healing  Outcome: Ongoing - Unchanged

## 2019-08-06 NOTE — Unmapped (Signed)
Pediatric Daily Progress Note     Assessment/Plan:     Active Problems:    [name]    [name]    Pain  Resolved Problems:    * No resolved hospital problems. *    [name] [name] is a 19 y.o. female  with a history of [name] (familial adenomatous polyposis), multiple desmoid tumors, epidermal cysts and GI polyps who presents for admission following scheduled cryoablation of chest wall desmoid tumors with VIR on 5/27. She is also s/p removal of lower extremity lesions with plastics on 5/28. She requires continued inpatient management for pain control, but she is hopeful to be able to go home tomorrow.    Pain control:  - scheduled Tylenol q6  - PRN oxycodone q4 or moderate pain  - PRN morphine q3 for severe pain  - PRN Flexeril 10 mg q8  - gabapentin 100 mg x1 this afternoon, will monitor response and consider adding this to regimen TID if it is helpful and does not overly sedate her    FEN/GI:  - reg diet  - PRN Zofran    Access: PIV    Discharge Criteria: Pain control    Plan of care discussed with caregiver(s) at bedside.      Subjective:     Interval History: Overnight, vital signs remained stable. She continued to be uncomfortable with continued pain, which is worsened with movement. Patient received scheduled tylenol, three doses of oxycodone and PRN Flexeril, which helped her sleep. She has been able to eat and drink more than yesterday. She walked to the nursing station with a member of the nursing staff and her mother, and while she was able to do it, stated that it caused a lot of pain. She is worried that she would have a hard time getting from the recliner to the bathroom with the set-up at home.    Objective:     Vital signs in last 24 hours:  Temp:  [36.5 ??C-36.9 ??C] 36.5 ??C  Heart Rate:  [61-84] 76  SpO2 Pulse:  [62] 62  Resp:  [8-18] 18  BP: (108-120)/(53-75) 108/75  MAP (mmHg):  [69-88] 88  SpO2:  [95 %-100 %] 99 %  Intake/Output last 3 shifts:  I/O last 3 completed shifts:  In: 3599 [P.O.:90; I.V.:3459; IV Piggyback:50]  Out: 2350 [Urine:2350]    Physical Exam:  General: tired-appearing female in no acute distress  Head: atraumatic and normocephalic  Eyes: conjunctiva clear  Nose: clear, no discharge  Oropharynx: moist mucous membranes  CV: regular rate and rhythm  Chest: two bandages over surgical sites (right and left); left surgical site has blood on bandage and swelling and erythema surrounding the site; right surgical site with no blood or swelling  Pulm: clear to auscultation of frontal upper lobes and right side middle lobe, shallow breathing limited by pain  Abdomen: normal bowel sounds, no tenderness, rebound, or guarding  Extremities: mild swelling of right hand, ROM of left arm limited by pain  Neuro: sluggish movement of fingers of right hand; left arm movement limited by pain  Skin: skin texture and turgor normal; no bruising or rashes noted    Labs/Studies:  Labs and Studies from the last 24hrs per EMR and Reviewed  ========================================  Boris Sharper, MD  [facility] PGY-1

## 2019-08-06 NOTE — Unmapped (Signed)
Remained afebrile throughout the night, VSS. Pt c/o pain around surgical sites as well as generalized pain. Heating pad helping with pain. 2x Oxy given with good results. 1x flexeril given prior to bedtime. Scheduled tylenol given. Pt voiding well, no bm overnight. Pt had a good appetite this evening and ate a good amount for dinner.  Encouraging pt to move self/ambulate/ move all extremities. Mom at bed and active in cares. Will continue with POC.        Problem: Adult Inpatient Plan of Care  Goal: Absence of Hospital-Acquired Illness or Injury  Outcome: Progressing     Problem: Adult Inpatient Plan of Care  Goal: Plan of Care Review  Outcome: Progressing     Problem: Adult Inpatient Plan of Care  Goal: Rounds/Family Conference  Outcome: Progressing

## 2019-08-07 DIAGNOSIS — R52 Pain, unspecified: Secondary | ICD-10-CM | POA: Diagnosis not present

## 2019-08-07 MED ORDER — OXYCODONE 5 MG TABLET
ORAL_TABLET | ORAL | 0 refills | 1 days | Status: CP | PRN
Start: 2019-08-07 — End: 2019-08-12

## 2019-08-07 MED ORDER — GABAPENTIN 100 MG CAPSULE
ORAL_CAPSULE | Freq: Three times a day (TID) | ORAL | 0 refills | 10 days | Status: CP | PRN
Start: 2019-08-07 — End: 2019-09-06

## 2019-08-07 MED ORDER — CYCLOBENZAPRINE 10 MG TABLET
ORAL_TABLET | Freq: Three times a day (TID) | ORAL | 0 refills | 10.00000 days | Status: CP | PRN
Start: 2019-08-07 — End: 2019-09-06

## 2019-08-07 NOTE — Unmapped (Signed)
Hospital Pediatrics Discharge Summary    Patient Information:   [name] [name]  Date of Birth: [birth year]    Admission/Discharge Information:     Admit Date: 08/04/2019 Admitting Attending: Ivor Reining, MD   Discharge Date: 08/07/19 Discharge Attending: Ivor Reining, MD   Length of Stay: 3 days Primary Provider Team: [facility] - Ped General A [place] Si Team)          Disposition: Home  **Condition at Discharge:   Improved    Final Diagnoses:   Active Problems:    [name]    [name]    Pain  Resolved Problems:    * No resolved hospital problems. *      Reason(s) for Hospitalization:     1. Pain control    Pertinent Results/Procedures Performed:   Last Weight: Weight: 54.8 kg (120 lb 13 oz)    Imaging Results:   CXR - no pneumothorax    Hospital Course:   [name] [name] is a 19 year old with a history of [name] (familial adenomatous polyposis), multiple desmoid tumors, epidermal cysts and GI polyps who presented for admission following scheduled cryoablation of chest wall desmoid tumors with VIR and excision of subcutaneous lesions with plastic surgery the following day.     Pain control:  Following the procedure with VIR, which was well tolerated and uncomplicated, [name] [name]'s pain was managed with scheduled Tylenol, PRN oxycodone, PRN morphine and a one-time dose of Flexeril due to complaint of muscle spasms. She continued to have pain and discomfort overnight. After the procedure with plastics, medications were continued as above and Flexeril was increased to 10 mg q8 PRN. She continued to have pain throughout the following day and received several additional doses of oxycodone and Flexeril. She was started on gabapentin 100 mg TID due to concern for neuropathic pain, which she described as pins and needles, along her right arm to her hand. She was discharged on a regimen of scheduled Tylenol, PRN gabapentin, PRN Flexeril and PRN oxycodone. She will be following up with plastic surgery within the coming week and VIR in the near future as well. [name] [name] and her mother have been provided with the numbers for both the plastic surgery and VIR clinics.    FEN/GI:  Between the procedures, [name] [name] was maintained on a regular diet up until midnight the night before the procedure with plastic surgery. However, she did not eat or drink very much due to pain with sitting up in bed, so she was started on maintenance fluids. Patient experienced nausea and was given Zofran. After the procedures, [name] [name] was moved to a regular diet with continued Zofran PRN.      Discharge Exam:   BP 107/65  - Pulse 62  - Temp 36.9 ??C (Oral)  - Resp 16  - Ht 165.1 cm (5' 5)  - Wt 54.8 kg (120 lb 13 oz)  - LMP 07/28/2019  - SpO2 98%  - BMI 20.10 kg/m??       General: tired-appearing female in no acute distress  Head: atraumatic and normocephalic  Eyes: conjunctiva clear  Nose: clear, no discharge  Oropharynx: moist mucous membranes  CV: regular rate and rhythm  Chest: two bandages over surgical sites (right and left); left surgical site has blood on bandage and swelling and erythema surrounding the site; right surgical site with no blood or swelling  Pulm: clear to auscultation of frontal upper lobes and right side middle lobe, shallow breathing limited by pain  Abdomen: normal bowel sounds, no tenderness, rebound, or guarding  Extremities: mild swelling of right hand, ROM of left arm limited by pain  Neuro: sluggish movement of fingers of right hand; left arm movement limited by pain  Skin: skin texture and turgor normal; no bruising or rashes noted    Studies Pending at Time of Discharge:     Pending Labs     Order Current Status    Surgical pathology exam In process        To be followed up by: plastic surgery.    Discharge Medications and Orders:   Discharge Medications:     Your Medication List      STOP taking these medications    ferrous sulfate 325 (65 FE) MG tablet        START taking these medications cyclobenzaprine 10 MG tablet  Commonly known as: FLEXERIL  Take 1 tablet (10 mg total) by mouth every eight (8) hours as needed for muscle spasms.     gabapentin 100 MG capsule  Commonly known as: NEURONTIN  Take 1 capsule (100 mg total) by mouth Three (3) times a day as needed.     oxyCODONE 5 MG immediate release tablet  Commonly known as: ROXICODONE  Take 1 tablet (5 mg total) by mouth every four (4) hours as needed for up to 5 days.        CONTINUE taking these medications    acetaminophen 500 MG tablet  Commonly known as: TYLENOL  Take 500 mg by mouth.     cyproheptadine 4 mg tablet  Commonly known as: PERIACTIN  TAKE 1 TABLET BY MOUTH 3 TIMES A DAY     ergocalciferol-1,250 mcg (50,000 unit) 1,250 mcg (50,000 unit) capsule  Commonly known as: DRISDOL  Take 50,000 Units by mouth once a week. Takes on Wednesdays     famotidine 40 MG tablet  Commonly known as: PEPCID  Take 40 mg by mouth daily as needed for heartburn.     ondansetron 8 MG tablet  Commonly known as: ZOFRAN  Take 1 tablet (8 mg total) by mouth every twelve (12) hours as needed for nausea.             DME Orders:      Home Health Orders:   None    Discharge Instructions:   Activity:   Activity Instructions     Activity as tolerated            Diet:   Diet Instructions     Discharge diet (specify)      Discharge Nutrition Therapy: Regular          Instructions and Other Follow-ups after Discharge:  Follow Up instructions and Outpatient Referrals     Discharge instructions      [facility] Plastic Surgery  Discharge Instructions for [name] [name], M.D.        Procedure performed: Excision of right leg and left neck lesions        Medications:    Antibiotics:  - You were prescribed antibiotics after surgery. You should take your antibiotic as prescribed. Do not stop taking your antibiotic early.    Pain and pain medication:  - You have been prescribed a narcotic pain medication. Take this as directed. Do not drive, operate heavy machinery, or make important decisions while taking these medications. Do not drink alcohol or take illegal drugs while on narcotic pain medication. These medications may cause constipation, itching, and nausea.  - You may  alternate acetaminophen (Tylenol) and ibuprofen (Advil or Motrin) over the counter for pain relief. Follow the instructions on the bottle. Do not exceed 4 grams (4,000 mg) of acetaminophen/Tylenol per day.    Stool softeners:  - Prescription narcotic pain medication, such as oxycodone, Percocet, or Vicodin, can cause constipation. Anaesthesia may also cause constipation. If you become constipated after surgery, you should take polyethylene glycol (Miralax) or sennokot (Senna) to help you have regular bowel movements. Follow the instructions on the bottle.  - If you are constipated after surgery, do not use Colace, as this can make constipation worse while you are taking narcotics.  - If you are constipated after surgery, you should ensure that you have adequate fiber in your diet (>25 grams per day) and drink at least 64oz of water daily. If you become constipated even after using these medications, you may take magnesium citrate. Follow the instructions on the bottle.      Restrictions:    Activity Restrictions:  - Do not exercise, No exercise, no strenuous activity, no yoga. Do not lift any objects heavier than 5 lbs.  - Walking is encouraged. Do not power-walk, but you may go for gentle walks.    What foods can I eat?:  Normal: You may resume eating normal foods. You do not have any food restrictions.    Showering and Bathing:  - You may shower 48 hours after surgery. When you shower, allow warm water and soap to run over the surgical site. Dab the area dry, but do not scrub the area. Keep the surgical site clean and dry at all times otherwise.  - Absolutely no submerging under water. No baths, no hot tubs, no swimming, no pools, no lakes, no rivers, no oceans, etc. Doing so will significantly increase your risk of infection.    Post-op Care:   - You have compression wrap in place on right leg. This should be kept in place for 24 hours.   - Rewrap leg with ace wrap for gentle compression for one week. Keep leg elevated to help with swelling.  - You have steri strips in place on left neck and one of right leg incisions; do not remove these. The medial right leg incision has sutures that will need to be removed in 2 weeks; the others have absorbable sutures.     When should I call my surgeon?:    When should I call?:  In case of emergency:  - Please call 911 or go to the nearest emergency room.    Please call if you develop any of the following symptoms:  - Fever, i.e. temperature greater than 101.3??F (or 38.5??C) for adults, or 100.4??F (or 38.0??C) for children  - Persistent nausea or vomiting that does not go away  - Severe pain that cannot be controlled with prescription and over-the-counter pain medication  - Rapidly increasing swelling of at the site of surgery  - Redness, tenderness, foul odor, or thick green or yellow discharge from a surgical site  - Large amounts of blood coming from your incisions or surgical sites  - Any other concerning symptom    Who should I call?:  In case of emergency, call 911 or go to the nearest emergency room.    For Monday through Friday, 8AM to 5PM:  - Dr. Dorothy Spark nurses are Elisabeth Pigeon, RN; and Sarita Bottom, RN. Joni Reining can be reached at [phone], or via e-mail at [email or web address] Gean Maidens can be reached at [phone]) [phone], or via  e-mail at [email or web address]  - If you are unable to reach the above nurse, please call the Plastic Surgery clinic at [phone].    For Monday through Friday, after 5PM; and for weekends, 24/7:  - If you have a scheduling question, please call Monday through Friday during business hours.  - If you have an urgent medical issue related to your surgery, please call the [facility] operator at [phone], and ask them to page the Plastic Surgery resident on call. The operator will contact the Plastic Surgery resident, and you should receive a call back within 1-2 hours.    Plastic Surgery Clinics:  [facility] Plastic Surgery Clinic, [facility] - [phone]  [facility] - [phone]  [facility] Pediatric [facility]astic Surgery [facility], [facility] - [phone]        Follow-Up Appointments:  A follow-up appointment has been requested for 1 week(s) from today. You should receive a call from our schedulers within 1-3 days. If you do not receive a call within 1-3 days, please call the Plastic Surgery clinic at [phone] to set up your appointment.    Discharge instructions      It was nice meeting you all! [name] [name] was monitored in the hospital for pain after two procedures (one with VIR and one with plastic surgery). Her pain improved after several days and was felt to be safe for discharge with plan to take Tylenol every 6 hours as needed, Flexeril every 8 hours as needed, gabapentin three times per day as needed and oxycodone as needed for severe pain. She will be seen by plastic surgery in follow-up and with Dr. Selena Batten on 7/28.    Call [phone] to set-up a follow-up appointment with plastic surgery on Tuesday given that tomorrow is a holiday.    Please call Tenley's primary doctor if her pain does not seem to be responding to the prescribed medications or if her pain seems to be worsening.          Future Appointments:  Appointments which have been scheduled for you    Aug 24, 2019 10:30 AM  (Arrive by 10:15 AM)  RETURN  GENERAL with Helyn Numbers, MD  [facility] GI MEDICINE [place] [facility] HILL [facility]) [address]  [place] [address]  [phone]      Sep 13, 2019  9:30 AM  (Arrive by 9:00 AM)  RETURN  GENERAL with Debbe Mounts, MD  [facility] CHILDRENS HEMATOLOGY ONCOLOGY CANCER HOSP [facility] REGION) [address]  [place] [address] [phone]      Oct 03, 2019 11:00 AM  (Arrive by 10:45 AM)  RETURN  ENDOCRINE with Sherrell Puller, MD  [facility] CHILDRENS ENDOCRINOLOGY [place] [facility] REGION) [address]  [place] [address]  [phone]      Oct 05, 2019 11:30 AM  (Arrive by 11:00 AM)  RETURN  GENERAL with Jobe Gibbon, MD  [facility] VASCULAR INTERVENTIONAL RAD CLINIC [facility] REGION) [address]  [place] [address]  [phone]             Signature(s):   Shon Baton, MD  [facility] Pediatrics PGY-1

## 2019-08-07 NOTE — Unmapped (Signed)
Fannie remains afebrile and VSS. Tolerating regular diet with fair appetite. Voiding well, no bowel movement in 4 days however feels the urdge to go today. Colace given as ordered. Dressings to chest and right leg are clean, dry and intact. Oxycodone x1 Flexeril X1 and Zofran X1 given overnight. PIV saline locked. Mother @ bedside assisting with care. Anticipate discharge today.    Problem: Wound  Goal: Optimal Wound Healing  Outcome: Progressing     Problem: Adult Inpatient Plan of Care  Goal: Plan of Care Review  Outcome: Progressing  Goal: Patient-Specific Goal (Individualization)  Outcome: Progressing  Goal: Absence of Hospital-Acquired Illness or Injury  Outcome: Progressing  Goal: Optimal Comfort and Wellbeing  Outcome: Progressing  Goal: Readiness for Transition of Care  Outcome: Progressing  Goal: Rounds/Family Conference  Outcome: Progressing

## 2019-08-07 NOTE — Unmapped (Signed)
[name] [name]'s VSS on RA, afebrile. PIV C/D/I, Sl. Oxy given x2 and Flexeril given x1 for surgical pain sites leg and chest. Gabapentin started today for possible nerve pain in chest area, per MD need to space Flexeril and Gabapentin apart. Leg ace wrap and dressing changed today by MD. Pt. Active throughout the day: Up in chair, walked to the nurses station and was off the unit via wheelchair. Pt. Walking to the bathroom and is voiding well, no BM noted. Pt. Resting well with foot elevated and heating pad applied to leg and chest. Mom at bedside assisting with cares. Will continue to monitor.     Problem: Adult Inpatient Plan of Care  Goal: Plan of Care Review  Outcome: Ongoing - Unchanged  Goal: Patient-Specific Goal (Individualization)  Outcome: Ongoing - Unchanged  Goal: Absence of Hospital-Acquired Illness or Injury  Outcome: Ongoing - Unchanged  Goal: Optimal Comfort and Wellbeing  Outcome: Ongoing - Unchanged  Goal: Readiness for Transition of Care  Outcome: Ongoing - Unchanged  Goal: Rounds/Family Conference  Outcome: Ongoing - Unchanged     Problem: Wound  Goal: Optimal Wound Healing  Outcome: Ongoing - Unchanged

## 2019-08-08 NOTE — Unmapped (Signed)
[name] [name] is being discharged with mom.  Discharge instructions given to [facility] and mom.  Questions answered prior to dc.  PIV was removed.    Problem: Adult Inpatient Plan of Care  Goal: Plan of Care Review  Outcome: Progressing  Goal: Patient-Specific Goal (Individualization)  Outcome: Progressing  Goal: Absence of Hospital-Acquired Illness or Injury  Outcome: Progressing  Goal: Optimal Comfort and Wellbeing  Outcome: Progressing  Goal: Readiness for Transition of Care  Outcome: Progressing  Goal: Rounds/Family Conference  Outcome: Progressing     Problem: Wound  Goal: Optimal Wound Healing  Outcome: Progressing

## 2019-08-10 NOTE — Unmapped (Signed)
This patient has been disenrolled from the [facility] specialty pharmacy services due to medication discontinuation resulting from side effect intolerance.    Rollen Sox  [facility] Specialty Pharmacist

## 2019-08-11 ENCOUNTER — Telehealth: Admit: 2019-08-11 | Discharge: 2019-08-12 | Payer: PRIVATE HEALTH INSURANCE

## 2019-08-16 ENCOUNTER — Encounter: Admit: 2019-08-16 | Discharge: 2019-08-17 | Payer: PRIVATE HEALTH INSURANCE

## 2019-08-16 DIAGNOSIS — D481 Neoplasm of uncertain behavior of connective and other soft tissue: Principal | ICD-10-CM

## 2019-08-16 DIAGNOSIS — Q8789 Other specified congenital malformation syndromes, not elsewhere classified: Principal | ICD-10-CM

## 2019-08-18 ENCOUNTER — Encounter: Admit: 2019-08-18 | Discharge: 2019-08-19 | Payer: PRIVATE HEALTH INSURANCE

## 2019-08-18 MED ORDER — FEXOFENADINE 180 MG TABLET: 0 refills | 0 days

## 2019-08-18 MED ORDER — DOXYCYCLINE HYCLATE 100 MG CAPSULE: 0 refills | 0 days

## 2019-08-18 MED ORDER — ONDANSETRON HCL 8 MG TABLET: 0 refills | 0 days

## 2019-08-24 ENCOUNTER — Ambulatory Visit
Admit: 2019-08-24 | Discharge: 2019-08-25 | Payer: PRIVATE HEALTH INSURANCE | Attending: Gastroenterology | Primary: Gastroenterology

## 2019-08-24 DIAGNOSIS — D126 Benign neoplasm of colon, unspecified: Principal | ICD-10-CM

## 2019-08-24 MED ORDER — OMEPRAZOLE 40 MG CAPSULE,DELAYED RELEASE
ORAL_CAPSULE | Freq: Every day | ORAL | 3 refills | 90.00000 days | Status: CP
Start: 2019-08-24 — End: 2020-08-23

## 2019-08-24 MED ORDER — SULINDAC 150 MG TABLET
ORAL_TABLET | Freq: Two times a day (BID) | ORAL | 11 refills | 30.00000 days | Status: CP
Start: 2019-08-24 — End: 2020-08-23

## 2019-08-26 DIAGNOSIS — D126 Benign neoplasm of colon, unspecified: Principal | ICD-10-CM

## 2019-08-30 ENCOUNTER — Ambulatory Visit (INDEPENDENT_AMBULATORY_CARE_PROVIDER_SITE_OTHER): Payer: BC Managed Care – PPO

## 2019-08-30 ENCOUNTER — Other Ambulatory Visit: Payer: Self-pay

## 2019-08-30 DIAGNOSIS — Z111 Encounter for screening for respiratory tuberculosis: Secondary | ICD-10-CM | POA: Diagnosis not present

## 2019-09-01 ENCOUNTER — Ambulatory Visit: Payer: BC Managed Care – PPO

## 2019-09-01 ENCOUNTER — Other Ambulatory Visit: Payer: Self-pay

## 2019-09-01 DIAGNOSIS — Z111 Encounter for screening for respiratory tuberculosis: Secondary | ICD-10-CM

## 2019-09-01 LAB — TB SKIN TEST
Induration: 0 mm
TB Skin Test: NEGATIVE

## 2019-09-01 NOTE — Progress Notes (Signed)
Per orders of Dr. Lenor Coffin reading  by Francella Solian   PPD results were negative. Letter given to patient for school. No other questions.

## 2019-09-05 ENCOUNTER — Encounter: Admit: 2019-09-05 | Discharge: 2019-09-05 | Payer: PRIVATE HEALTH INSURANCE

## 2019-09-05 ENCOUNTER — Ambulatory Visit: Admit: 2019-09-05 | Discharge: 2019-09-06 | Payer: PRIVATE HEALTH INSURANCE | Attending: Family | Primary: Family

## 2019-09-05 DIAGNOSIS — Z01818 Encounter for other preprocedural examination: Principal | ICD-10-CM

## 2019-09-07 ENCOUNTER — Encounter: Admit: 2019-09-07 | Discharge: 2019-09-08 | Payer: PRIVATE HEALTH INSURANCE

## 2019-09-07 ENCOUNTER — Encounter
Admit: 2019-09-07 | Discharge: 2019-09-08 | Payer: PRIVATE HEALTH INSURANCE | Attending: Anesthesiology | Primary: Anesthesiology

## 2019-09-07 DIAGNOSIS — Q8789 Other specified congenital malformation syndromes, not elsewhere classified: Principal | ICD-10-CM

## 2019-09-07 DIAGNOSIS — D481 Neoplasm of uncertain behavior of connective and other soft tissue: Principal | ICD-10-CM

## 2019-09-07 DIAGNOSIS — K219 Gastro-esophageal reflux disease without esophagitis: Secondary | ICD-10-CM | POA: Diagnosis not present

## 2019-09-07 DIAGNOSIS — Z792 Long term (current) use of antibiotics: Secondary | ICD-10-CM | POA: Diagnosis not present

## 2019-09-07 MED ORDER — CYCLOBENZAPRINE 10 MG TABLET
ORAL_TABLET | Freq: Three times a day (TID) | ORAL | 0 refills | 10 days | Status: CP
Start: 2019-09-07 — End: ?

## 2019-09-08 MED ORDER — KETOROLAC 10 MG TABLET
ORAL_TABLET | Freq: Three times a day (TID) | ORAL | 0 refills | 5 days | Status: CP | PRN
Start: 2019-09-08 — End: 2019-09-13

## 2019-09-08 MED ORDER — OXYCODONE 5 MG TABLET
ORAL_TABLET | Freq: Three times a day (TID) | ORAL | 0 refills | 4.00000 days | Status: CP | PRN
Start: 2019-09-08 — End: ?

## 2019-10-10 ENCOUNTER — Encounter: Admit: 2019-10-10 | Discharge: 2019-10-11 | Payer: PRIVATE HEALTH INSURANCE | Attending: MS" | Primary: MS"

## 2019-10-10 DIAGNOSIS — Q8789 Other specified congenital malformation syndromes, not elsewhere classified: Secondary | ICD-10-CM | POA: Diagnosis not present

## 2019-10-10 DIAGNOSIS — D481 Neoplasm of uncertain behavior of connective and other soft tissue: Secondary | ICD-10-CM | POA: Diagnosis not present

## 2019-10-10 DIAGNOSIS — D485 Neoplasm of uncertain behavior of skin: Secondary | ICD-10-CM | POA: Diagnosis not present

## 2019-10-12 ENCOUNTER — Encounter: Admit: 2019-10-12 | Discharge: 2019-10-13 | Payer: PRIVATE HEALTH INSURANCE

## 2019-10-12 ENCOUNTER — Encounter
Admit: 2019-10-12 | Discharge: 2019-10-13 | Payer: PRIVATE HEALTH INSURANCE | Attending: Pediatric Hematology-Oncology | Primary: Pediatric Hematology-Oncology

## 2019-10-12 DIAGNOSIS — D485 Neoplasm of uncertain behavior of skin: Principal | ICD-10-CM

## 2019-10-12 DIAGNOSIS — D481 Neoplasm of uncertain behavior of connective and other soft tissue: Secondary | ICD-10-CM | POA: Diagnosis not present

## 2019-10-12 DIAGNOSIS — Q8789 Other specified congenital malformation syndromes, not elsewhere classified: Secondary | ICD-10-CM | POA: Diagnosis not present

## 2019-10-24 ENCOUNTER — Ambulatory Visit: Admit: 2019-10-24 | Discharge: 2019-10-25 | Payer: PRIVATE HEALTH INSURANCE | Attending: Surgery | Primary: Surgery

## 2019-10-24 DIAGNOSIS — D126 Benign neoplasm of colon, unspecified: Principal | ICD-10-CM

## 2019-11-09 ENCOUNTER — Other Ambulatory Visit: Payer: Self-pay

## 2019-11-09 ENCOUNTER — Ambulatory Visit (INDEPENDENT_AMBULATORY_CARE_PROVIDER_SITE_OTHER): Payer: BC Managed Care – PPO | Admitting: Physician Assistant

## 2019-11-09 ENCOUNTER — Encounter: Payer: Self-pay | Admitting: Physician Assistant

## 2019-11-09 VITALS — BP 102/72 | HR 78 | Temp 98.4°F | Ht 66.0 in | Wt 115.0 lb

## 2019-11-09 DIAGNOSIS — R55 Syncope and collapse: Secondary | ICD-10-CM | POA: Diagnosis not present

## 2019-11-09 LAB — POC URINALSYSI DIPSTICK (AUTOMATED)
Bilirubin, UA: NEGATIVE
Blood, UA: NEGATIVE
Glucose, UA: NEGATIVE
Ketones, UA: NEGATIVE
Leukocytes, UA: NEGATIVE
Nitrite, UA: NEGATIVE
Protein, UA: NEGATIVE
Spec Grav, UA: 1.015 (ref 1.010–1.025)
Urobilinogen, UA: 0.2 E.U./dL
pH, UA: 8 (ref 5.0–8.0)

## 2019-11-09 LAB — POCT URINE PREGNANCY: Preg Test, Ur: NEGATIVE

## 2019-11-09 NOTE — Patient Instructions (Signed)
It was great to see you!  Drink fluids, eat regular meals.   I will be in touch with all of your lab results.  Take care,  Inda Coke PA-C

## 2019-11-09 NOTE — Progress Notes (Signed)
Lisa Donovan is a 19 y.o. female here for a new problem.  I acted as a Education administrator for [employer], PA-C Anselmo Pickler, LPN   History of Present Illness:   Chief Complaint  Patient presents with  . Loss of Consciousness    HPI   Syncope Pt had episode on Monday in class she says she passed out twice within a 5-minute time frame. Pt says she was nauseated, then got hot and sweaty, had loss of hearing and then passed out. Had protein bar 40 min and 1/2 water bottle prior to this episode. She's very stressed with school, having long days (12 hour days), and limited sleep.  Has anxiety with eating and drinking because she has ongoing issues with feeling nauseated. Has felt off ever since doing chemotherapy. Has health anxiety about getting sick and feeling  Appetite is overall ok with the prilosec.  Hasn't been feeling well in the past few days. Got first dose of Pfizer last Thursday. When she stands up she feels lightheaded.   Was taking her oral iron every day for "awhile" and then stopped once school started because she got so busy.  Labs from Aug 4th Ferritin 10.1 Iron 32 Iron Saturation 9% TIBC 352  Having ongoing rectal bleeding that started 4-6 weeks ago, occurring with each bowel movement. Is not dripping and significant amount. Has followed up on colonoscopy in December. Has been told by her gastroenterologist in the past to follow-up with them if the bleeding is significant.  Does admit to feeling anxious about school, recently got out of long-term relationship, and having to consider more chemo in future.  Prior syncopal event earlier this year was worked up by cardiology -- had negative echo and holter monitor.  Wt Readings from Last 4 Encounters:  11/09/19 115 lb (52.2 kg) (24 %, Z= -0.71)*  07/25/19 112 lb 8 oz (51 kg) (20 %, Z= -0.85)*  07/14/12 83 lb (37.6 kg) (19 %, Z= -0.87)*   * Growth percentiles are based on CDC (Girls, 2-20 Years) data.     Past  Medical History:  Diagnosis Date  . Colon polyps    Dx at age 43, has colonoscopy yearly  . [name]    Dx at age 68  . GERD (gastroesophageal reflux disease)   . H/O colonoscopy    Pt has has them yearly  . History of endoscopy      Social History   Tobacco Use  . Smoking status: Never Smoker  . Smokeless tobacco: Never Used  Vaping Use  . Vaping Use: Never used  Substance Use Topics  . Alcohol use: Never  . Drug use: Never    Past Surgical History:  Procedure Laterality Date  . Oral surgery    . Tumor removed     Numerous surgies since she was 52 months old    History reviewed. No pertinent family history.  Allergies  Allergen Reactions  . Neomycin Swelling and Rash    Current Medications:   Current Outpatient Medications:  .  acetaminophen (TYLENOL) 500 MG tablet, Take by mouth., Disp: , Rfl:  .  cyclobenzaprine (FLEXERIL) 10 MG tablet, Take 10 mg by mouth 3 (three) times daily., Disp: , Rfl:  .  famotidine (PEPCID) 40 MG tablet, Take 40 mg by mouth as needed. , Disp: , Rfl:  .  omeprazole (PRILOSEC) 40 MG capsule, Take 40 mg by mouth daily., Disp: , Rfl:  .  ondansetron (ZOFRAN) 8 MG tablet, SMARTSIG:1 Tablet(s)  By Mouth Every 12 Hours PRN, Disp: , Rfl:  .  Polysaccharide Iron Complex (POLYSACCHARIDE IRON PO), Take 100 mg by mouth daily in the afternoon., Disp: , Rfl:  .  Vitamin D, Ergocalciferol, (DRISDOL) 1.25 MG (50000 UNIT) CAPS capsule, Take 1 capsule (50,000 Units total) by mouth every 7 (seven) days., Disp: 8 capsule, Rfl: 0   Review of Systems:   ROS Negative unless otherwise specified per HPI.  Vitals:   Vitals:   11/09/19 1142  BP: 102/72  Pulse: 78  Temp: 98.4 F (36.9 C)  TempSrc: Temporal  SpO2: 98%  Weight: 115 lb (52.2 kg)  Height: 5\' 6"  (1.676 m)     Body mass index is 18.56 kg/m.  Physical Exam:   Physical Exam Vitals and nursing note reviewed.  Constitutional:      General: She is not in acute distress.     Appearance: She is well-developed. She is not ill-appearing or toxic-appearing.  Cardiovascular:     Rate and Rhythm: Normal rate and regular rhythm.     Pulses: Normal pulses.     Heart sounds: Normal heart sounds, S1 normal and S2 normal.     Comments: No LE edema Pulmonary:     Effort: Pulmonary effort is normal.     Breath sounds: Normal breath sounds.  Skin:    General: Skin is warm and dry.  Neurological:     Mental Status: She is alert.     GCS: GCS eye subscore is 4. GCS verbal subscore is 5. GCS motor subscore is 6.  Psychiatric:        Attention and Perception: Attention normal.        Mood and Affect: Mood is anxious. Affect is tearful.        Speech: Speech normal.        Behavior: Behavior normal. Behavior is cooperative.       Assessment and Plan:   Lisa Donovan was seen today for loss of consciousness.  Diagnoses and all orders for this visit:  Syncope, unspecified syncope type Suspect likely due to dehydration. Does have uncontrolled anxiety, and we discussed this at length today. Anxiety is limiting her ability to drink fluids, eat and take oral iron. Recommend meeting with psychologist soon to work on stress reduction and consider medication. Will update labs and urine. If labs unrevealing, will have patient return to her cardiologist. If labs show low iron, recommend that she consider reaching out to hematology/oncology about infusions given she is unable to tolerate oral iron. -     POCT Urinalysis Dipstick (Automated) -     POCT urine pregnancy -     CBC with Differential/Platelet; Future -     Comprehensive metabolic panel; Future -     Ferritin; Future -     Iron; Future -     TSH; Future -     Iron -     TSH -     Ferritin -     Comprehensive metabolic panel -     CBC with Differential/Platelet   . Reviewed expectations re: course of current medical issues. . Discussed self-management of symptoms. . Outlined signs and symptoms indicating need  for more acute intervention. . Patient verbalized understanding and all questions were answered. . See orders for this visit as documented in the electronic medical record. . Patient received an After-Visit Summary.  CMA or LPN served as scribe during this visit. History, Physical, and Plan performed by medical provider. The above documentation has  been reviewed and is accurate and complete.  Time spent with patient today was 60 minutes which consisted of chart review, discussing diagnosis, work up, treatment answering questions and documentation.   Inda Coke, PA-C

## 2019-11-10 ENCOUNTER — Encounter: Payer: Self-pay | Admitting: Physician Assistant

## 2019-11-10 DIAGNOSIS — D500 Iron deficiency anemia secondary to blood loss (chronic): Principal | ICD-10-CM

## 2019-11-10 LAB — COMPREHENSIVE METABOLIC PANEL
AG Ratio: 1.8 (calc) (ref 1.0–2.5)
ALT: 8 U/L (ref 5–32)
AST: 14 U/L (ref 12–32)
Albumin: 4.5 g/dL (ref 3.6–5.1)
Alkaline phosphatase (APISO): 53 U/L (ref 36–128)
BUN: 10 mg/dL (ref 7–20)
CO2: 27 mmol/L (ref 20–32)
Calcium: 9.6 mg/dL (ref 8.9–10.4)
Chloride: 104 mmol/L (ref 98–110)
Creat: 0.65 mg/dL (ref 0.50–1.00)
Globulin: 2.5 g/dL (calc) (ref 2.0–3.8)
Glucose, Bld: 82 mg/dL (ref 65–99)
Potassium: 4 mmol/L (ref 3.8–5.1)
Sodium: 138 mmol/L (ref 135–146)
Total Bilirubin: 0.4 mg/dL (ref 0.2–1.1)
Total Protein: 7 g/dL (ref 6.3–8.2)

## 2019-11-10 LAB — CBC WITH DIFFERENTIAL/PLATELET
Absolute Monocytes: 713 cells/uL (ref 200–950)
Basophils Absolute: 57 cells/uL (ref 0–200)
Basophils Relative: 0.7 %
Eosinophils Absolute: 81 cells/uL (ref 15–500)
Eosinophils Relative: 1 %
HCT: 42.4 % (ref 35.0–45.0)
Hemoglobin: 13.8 g/dL (ref 11.7–15.5)
Lymphs Abs: 2568 cells/uL (ref 850–3900)
MCH: 27.5 pg (ref 27.0–33.0)
MCHC: 32.5 g/dL (ref 32.0–36.0)
MCV: 84.5 fL (ref 80.0–100.0)
MPV: 12.1 fL (ref 7.5–12.5)
Monocytes Relative: 8.8 %
Neutro Abs: 4682 cells/uL (ref 1500–7800)
Neutrophils Relative %: 57.8 %
Platelets: 243 10*3/uL (ref 140–400)
RBC: 5.02 10*6/uL (ref 3.80–5.10)
RDW: 11.5 % (ref 11.0–15.0)
Total Lymphocyte: 31.7 %
WBC: 8.1 10*3/uL (ref 3.8–10.8)

## 2019-11-10 LAB — TSH: TSH: 1.09 mIU/L

## 2019-11-10 LAB — FERRITIN: Ferritin: 6 ng/mL — ABNORMAL LOW (ref 16–154)

## 2019-11-10 LAB — IRON: Iron: 37 ug/dL (ref 27–164)

## 2019-11-13 ENCOUNTER — Encounter: Payer: Self-pay | Admitting: Physician Assistant

## 2019-11-15 ENCOUNTER — Encounter: Payer: Self-pay | Admitting: Physician Assistant

## 2019-11-22 ENCOUNTER — Encounter: Admit: 2019-11-22 | Discharge: 2019-11-23 | Payer: PRIVATE HEALTH INSURANCE

## 2019-11-22 ENCOUNTER — Encounter
Admit: 2019-11-22 | Discharge: 2019-11-23 | Payer: PRIVATE HEALTH INSURANCE | Attending: Pediatric Hematology-Oncology | Primary: Pediatric Hematology-Oncology

## 2019-11-22 DIAGNOSIS — K317 Polyp of stomach and duodenum: Secondary | ICD-10-CM | POA: Diagnosis not present

## 2019-11-22 DIAGNOSIS — D485 Neoplasm of uncertain behavior of skin: Secondary | ICD-10-CM | POA: Diagnosis not present

## 2019-11-22 DIAGNOSIS — Q8789 Other specified congenital malformation syndromes, not elsewhere classified: Secondary | ICD-10-CM | POA: Diagnosis not present

## 2019-11-22 DIAGNOSIS — Z86010 Personal history of colonic polyps: Secondary | ICD-10-CM | POA: Diagnosis not present

## 2019-11-22 DIAGNOSIS — D509 Iron deficiency anemia, unspecified: Secondary | ICD-10-CM | POA: Diagnosis not present

## 2019-11-22 DIAGNOSIS — N926 Irregular menstruation, unspecified: Secondary | ICD-10-CM | POA: Diagnosis not present

## 2019-11-22 DIAGNOSIS — D126 Benign neoplasm of colon, unspecified: Secondary | ICD-10-CM | POA: Diagnosis not present

## 2019-11-22 DIAGNOSIS — D500 Iron deficiency anemia secondary to blood loss (chronic): Secondary | ICD-10-CM | POA: Diagnosis not present

## 2019-11-22 DIAGNOSIS — R079 Chest pain, unspecified: Secondary | ICD-10-CM | POA: Diagnosis not present

## 2019-11-22 DIAGNOSIS — R110 Nausea: Secondary | ICD-10-CM | POA: Diagnosis not present

## 2019-11-22 DIAGNOSIS — E860 Dehydration: Secondary | ICD-10-CM | POA: Diagnosis not present

## 2019-12-09 ENCOUNTER — Ambulatory Visit: Payer: Self-pay | Admitting: Physician Assistant

## 2019-12-14 DIAGNOSIS — D126 Benign neoplasm of colon, unspecified: Principal | ICD-10-CM

## 2019-12-21 ENCOUNTER — Telehealth: Payer: Self-pay

## 2019-12-21 ENCOUNTER — Other Ambulatory Visit: Payer: Self-pay | Admitting: Physician Assistant

## 2019-12-21 DIAGNOSIS — E611 Iron deficiency: Secondary | ICD-10-CM

## 2019-12-21 NOTE — Telephone Encounter (Signed)
Orders have been placed.

## 2019-12-21 NOTE — Telephone Encounter (Signed)
Pt is requesting orders for ferritin and iron levels to be checked for tomorrow

## 2019-12-21 NOTE — Telephone Encounter (Signed)
FYI

## 2019-12-22 ENCOUNTER — Other Ambulatory Visit: Payer: BC Managed Care – PPO

## 2019-12-22 ENCOUNTER — Other Ambulatory Visit: Payer: Self-pay

## 2019-12-22 DIAGNOSIS — E611 Iron deficiency: Secondary | ICD-10-CM | POA: Diagnosis not present

## 2019-12-23 LAB — CBC WITH DIFFERENTIAL/PLATELET
Absolute Monocytes: 678 cells/uL (ref 200–950)
Basophils Absolute: 46 cells/uL (ref 0–200)
Basophils Relative: 0.6 %
Eosinophils Absolute: 100 cells/uL (ref 15–500)
Eosinophils Relative: 1.3 %
HCT: 44 % (ref 35.0–45.0)
Hemoglobin: 14.5 g/dL (ref 11.7–15.5)
Lymphs Abs: 2272 cells/uL (ref 850–3900)
MCH: 27.8 pg (ref 27.0–33.0)
MCHC: 33 g/dL (ref 32.0–36.0)
MCV: 84.5 fL (ref 80.0–100.0)
MPV: 12.3 fL (ref 7.5–12.5)
Monocytes Relative: 8.8 %
Neutro Abs: 4605 cells/uL (ref 1500–7800)
Neutrophils Relative %: 59.8 %
Platelets: 212 10*3/uL (ref 140–400)
RBC: 5.21 10*6/uL — ABNORMAL HIGH (ref 3.80–5.10)
RDW: 13.2 % (ref 11.0–15.0)
Total Lymphocyte: 29.5 %
WBC: 7.7 10*3/uL (ref 3.8–10.8)

## 2019-12-23 LAB — IRON: Iron: 106 ug/dL (ref 40–190)

## 2019-12-23 LAB — FERRITIN: Ferritin: 74 ng/mL (ref 16–154)

## 2019-12-30 MED ORDER — SIMETHICONE 125 MG CHEWABLE TABLET
ORAL_TABLET | ORAL | 0 refills | 0.00000 days | Status: CP
Start: 2019-12-30 — End: ?
  Filled 2020-01-03: qty 4, 2d supply, fill #0

## 2019-12-30 MED ORDER — POLYETHYLENE GLYCOL 3350 17 GRAM/DOSE ORAL POWDER
Freq: Once | ORAL | 0 refills | 1.00000 days | Status: CP
Start: 2019-12-30 — End: 2019-12-30
  Filled 2020-01-03: qty 119, 1d supply, fill #0

## 2019-12-30 MED ORDER — POLYETHYLENE GLYCOL 3350 17 GRAM/DOSE ORAL POWDER: 119 g | g | Freq: Once | 0 refills | 1 days | Status: AC

## 2019-12-30 MED ORDER — BISACODYL 5 MG TABLET,DELAYED RELEASE
ORAL_TABLET | Freq: Once | ORAL | 0 refills | 1.00000 days | Status: CP
Start: 2019-12-30 — End: 2019-12-30

## 2020-01-03 MED FILL — SIMETHICONE 125 MG CHEWABLE TABLET: 2 days supply | Qty: 4 | Fill #0 | Status: AC

## 2020-01-03 MED FILL — POLYETHYLENE GLYCOL 3350 17 GRAM/DOSE ORAL POWDER: ORAL | 1 days supply | Qty: 238 | Fill #0

## 2020-01-03 MED FILL — POLYETHYLENE GLYCOL 3350 17 GRAM/DOSE ORAL POWDER: 1 days supply | Qty: 238 | Fill #0 | Status: AC

## 2020-01-03 MED FILL — BISACODYL 5 MG TABLET,DELAYED RELEASE: ORAL | 1 days supply | Qty: 2 | Fill #0

## 2020-01-03 MED FILL — CLEARLAX 17 GRAM/DOSE ORAL POWDER: 1 days supply | Qty: 119 | Fill #0 | Status: AC

## 2020-01-03 MED FILL — BISACODYL 5 MG TABLET,DELAYED RELEASE: 1 days supply | Qty: 2 | Fill #0 | Status: AC

## 2020-01-09 ENCOUNTER — Encounter: Payer: Self-pay | Admitting: Physician Assistant

## 2020-01-10 ENCOUNTER — Encounter: Payer: Self-pay | Admitting: Physician Assistant

## 2020-01-23 DIAGNOSIS — Z20822 Contact with and (suspected) exposure to covid-19: Secondary | ICD-10-CM | POA: Diagnosis not present

## 2020-01-31 ENCOUNTER — Encounter: Payer: Self-pay | Admitting: Physician Assistant

## 2020-01-31 ENCOUNTER — Ambulatory Visit (INDEPENDENT_AMBULATORY_CARE_PROVIDER_SITE_OTHER): Payer: BC Managed Care – PPO | Admitting: Physician Assistant

## 2020-01-31 VITALS — BP 110/80 | HR 79 | Temp 98.7°F

## 2020-01-31 DIAGNOSIS — J029 Acute pharyngitis, unspecified: Secondary | ICD-10-CM | POA: Diagnosis not present

## 2020-01-31 MED ORDER — BENZONATATE 100 MG PO CAPS: 100.0000 mg | ORAL_CAPSULE | Freq: Three times a day (TID) | ORAL | 0 refills | Status: DC | PRN

## 2020-01-31 MED ORDER — HYDROCOD POLST-CPM POLST ER 10-8 MG/5ML PO SUER: 5.0000 mL | Freq: Every evening | ORAL | 0 refills | Status: DC | PRN

## 2020-01-31 NOTE — Progress Notes (Signed)
Lisa Donovan is a 19 y.o. female here for a new problem.  I acted as a Education administrator for [employer], PA-C Anselmo Pickler, LPN   History of Present Illness:   Chief Complaint  Patient presents with  . Sore Throat    HPI   Sore throat Pt c/o sore throat and pus pockets on tonsils x 1 week. She was seen at school clinic and was diagnosed with Tonsillitis and prescribed Amoxicillin x 10 days, she has 4 days left. Pt is also c/o cough especially at night. She has been using Robitussin 12 hr little relief. Pt was tested on 11/15 for COVID, Flu, RSV, mono and strep all were negative. Pt is also having some ear pain was told she had some fluid behind her ears. Denies fever or chills.  Worst symptom at this point is the cough that is keeping her up at night causing her to not get rest and causing persistent sore throat.   Past Medical History:  Diagnosis Date  . Colon polyps    Dx at age 21, has colonoscopy yearly  . [name]    Dx at age 89  . GERD (gastroesophageal reflux disease)   . H/O colonoscopy    Pt has has them yearly  . History of endoscopy      Social History   Tobacco Use  . Smoking status: Never Smoker  . Smokeless tobacco: Never Used  Vaping Use  . Vaping Use: Never used  Substance Use Topics  . Alcohol use: Never  . Drug use: Never    Past Surgical History:  Procedure Laterality Date  . Oral surgery    . Tumor removed     Numerous surgies since she was 75 months old    History reviewed. No pertinent family history.  Allergies  Allergen Reactions  . Neomycin Swelling and Rash    Current Medications:   Current Outpatient Medications:  .  acetaminophen (TYLENOL) 500 MG tablet, Take by mouth., Disp: , Rfl:  .  omeprazole (PRILOSEC) 40 MG capsule, Take 40 mg by mouth daily., Disp: , Rfl:  .  ondansetron (ZOFRAN) 8 MG tablet, SMARTSIG:1 Tablet(s) By Mouth Every 12 Hours PRN, Disp: , Rfl:  .  amoxicillin (AMOXIL) 875 MG tablet, Take 875 mg by  mouth 2 (two) times daily., Disp: , Rfl:  .  benzonatate (TESSALON) 100 MG capsule, Take 1 capsule (100 mg total) by mouth 3 (three) times daily as needed for cough., Disp: 20 capsule, Rfl: 0 .  chlorpheniramine-HYDROcodone (TUSSIONEX PENNKINETIC ER) 10-8 MG/5ML SUER, Take 5 mLs by mouth at bedtime as needed for cough., Disp: 140 mL, Rfl: 0   Review of Systems:   ROS  Negative unless otherwise specified per HPI.  Vitals:   Vitals:   01/31/20 1356  BP: 110/80  Pulse: 79  Temp: 98.7 F (37.1 C)  TempSrc: Temporal     There is no height or weight on file to calculate BMI.  Physical Exam:   Physical Exam Vitals and nursing note reviewed.  Constitutional:      General: She is not in acute distress.    Appearance: She is well-developed. She is not ill-appearing or toxic-appearing.  HENT:     Head: Normocephalic and atraumatic.     Right Ear: Ear canal and external ear normal. A middle ear effusion is present. Tympanic membrane is not erythematous, retracted or bulging.     Left Ear: Ear canal and external ear normal. A middle ear effusion  is present. Tympanic membrane is not erythematous, retracted or bulging.     Nose: Nose normal.     Right Sinus: No maxillary sinus tenderness or frontal sinus tenderness.     Left Sinus: No maxillary sinus tenderness or frontal sinus tenderness.     Mouth/Throat:     Pharynx: Uvula midline. No posterior oropharyngeal erythema.     Tonsils: No tonsillar exudate. 1+ on the right. 1+ on the left.  Eyes:     General: Lids are normal.     Conjunctiva/sclera: Conjunctivae normal.  Neck:     Trachea: Trachea normal.  Cardiovascular:     Rate and Rhythm: Normal rate and regular rhythm.     Pulses: Normal pulses.     Heart sounds: Normal heart sounds, S1 normal and S2 normal.     Comments: No LE edema Pulmonary:     Effort: Pulmonary effort is normal.     Breath sounds: Normal breath sounds. No decreased breath sounds, wheezing, rhonchi or rales.   Lymphadenopathy:     Cervical: Cervical adenopathy present.  Skin:    General: Skin is warm and dry.  Neurological:     Mental Status: She is alert.     GCS: GCS eye subscore is 4. GCS verbal subscore is 5. GCS motor subscore is 6.  Psychiatric:        Speech: Speech normal.        Behavior: Behavior normal. Behavior is cooperative.      Assessment and Plan:   Lisa Donovan was seen today for sore throat.  Diagnoses and all orders for this visit:  Sore throat No red flags on discussion, patient is not in any obvious distress during our visit. Discussed progression of most viral illness, and recommended supportive care at this point in time. Recommend that she finish out oral amoxicillin antibiotic. Start ibuprofen per package instructions to help with throat pain and inflammation. Tussionex cough syrup prescribed to help with cough so she can sleep -- discussed sleepy precautions with this medication. Also prescribed tessalon perles to use prn for her symptoms.  Reviewed return precautions including new/worsening fever, SOB, new/worsening cough or other concerns.  Recommended need to self-quarantine and practice social distancing until symptoms resolve.  I recommend that patient follow-up if symptoms worsen or persist despite treatment x 7-10 days, sooner if needed.  Other orders -     chlorpheniramine-HYDROcodone (TUSSIONEX PENNKINETIC ER) 10-8 MG/5ML SUER; Take 5 mLs by mouth at bedtime as needed for cough. -     benzonatate (TESSALON) 100 MG capsule; Take 1 capsule (100 mg total) by mouth 3 (three) times daily as needed for cough.  CMA or LPN served as scribe during this visit. History, Physical, and Plan performed by medical provider. The above documentation has been reviewed and is accurate and complete.  Inda Coke, PA-C

## 2020-01-31 NOTE — Patient Instructions (Signed)
It was great to see you!  Continue your antibiotic.   Use cough syrup at night -- this may make you sleepy, do not drive with this.  May take oral ibuprofen for throat pain/inflammation.  Also I have prescribed tessalon perles that you can use throughout the day for cough.   Push fluids and get plenty of rest. Please return if you are not improving as expected, or if you have high fevers (>101.5) or difficulty swallowing or worsening productive cough.  Call clinic with questions.  I hope you start feeling better soon!

## 2020-02-10 DIAGNOSIS — Q8789 Other specified congenital malformation syndromes, not elsewhere classified: Principal | ICD-10-CM

## 2020-02-21 ENCOUNTER — Encounter
Admit: 2020-02-21 | Discharge: 2020-02-22 | Payer: PRIVATE HEALTH INSURANCE | Attending: Hematology & Oncology | Primary: Hematology & Oncology

## 2020-02-21 ENCOUNTER — Ambulatory Visit
Admit: 2020-02-21 | Discharge: 2020-02-22 | Payer: PRIVATE HEALTH INSURANCE | Attending: Pediatric Hematology-Oncology | Primary: Pediatric Hematology-Oncology

## 2020-02-21 ENCOUNTER — Encounter: Admit: 2020-02-21 | Discharge: 2020-02-22 | Payer: PRIVATE HEALTH INSURANCE

## 2020-02-21 DIAGNOSIS — E0789 Other specified disorders of thyroid: Principal | ICD-10-CM

## 2020-02-21 DIAGNOSIS — Q8789 Other specified congenital malformation syndromes, not elsewhere classified: Principal | ICD-10-CM

## 2020-02-21 DIAGNOSIS — K635 Polyp of colon: Principal | ICD-10-CM

## 2020-02-21 DIAGNOSIS — D500 Iron deficiency anemia secondary to blood loss (chronic): Principal | ICD-10-CM

## 2020-02-21 DIAGNOSIS — Z8616 Personal history of COVID-19: Principal | ICD-10-CM

## 2020-02-21 DIAGNOSIS — D481 Neoplasm of uncertain behavior of connective and other soft tissue: Principal | ICD-10-CM

## 2020-02-21 DIAGNOSIS — D485 Neoplasm of uncertain behavior of skin: Principal | ICD-10-CM

## 2020-02-21 DIAGNOSIS — N926 Irregular menstruation, unspecified: Principal | ICD-10-CM

## 2020-02-21 DIAGNOSIS — K317 Polyp of stomach and duodenum: Principal | ICD-10-CM

## 2020-02-21 DIAGNOSIS — J029 Acute pharyngitis, unspecified: Principal | ICD-10-CM

## 2020-02-21 DIAGNOSIS — T451X5A Adverse effect of antineoplastic and immunosuppressive drugs, initial encounter: Principal | ICD-10-CM

## 2020-02-21 DIAGNOSIS — R55 Syncope and collapse: Principal | ICD-10-CM

## 2020-02-21 DIAGNOSIS — R110 Nausea: Principal | ICD-10-CM

## 2020-02-21 DIAGNOSIS — F419 Anxiety disorder, unspecified: Principal | ICD-10-CM

## 2020-02-21 DIAGNOSIS — E041 Nontoxic single thyroid nodule: Secondary | ICD-10-CM | POA: Diagnosis not present

## 2020-02-21 MED ORDER — DULOXETINE 20 MG CAPSULE,DELAYED RELEASE
ORAL_CAPSULE | Freq: Every day | ORAL | 2 refills | 30.00000 days | Status: CP
Start: 2020-02-21 — End: 2021-02-20

## 2020-02-22 ENCOUNTER — Encounter: Admit: 2020-02-22 | Discharge: 2020-02-23 | Payer: PRIVATE HEALTH INSURANCE

## 2020-02-22 ENCOUNTER — Encounter
Admit: 2020-02-22 | Discharge: 2020-02-23 | Payer: PRIVATE HEALTH INSURANCE | Attending: Hematology & Oncology | Primary: Hematology & Oncology

## 2020-02-22 ENCOUNTER — Ambulatory Visit: Admit: 2020-02-22 | Discharge: 2020-02-23 | Payer: PRIVATE HEALTH INSURANCE

## 2020-02-22 DIAGNOSIS — D485 Neoplasm of uncertain behavior of skin: Principal | ICD-10-CM

## 2020-02-22 DIAGNOSIS — Q8789 Other specified congenital malformation syndromes, not elsewhere classified: Principal | ICD-10-CM

## 2020-02-22 DIAGNOSIS — D481 Neoplasm of uncertain behavior of connective and other soft tissue: Principal | ICD-10-CM

## 2020-03-05 ENCOUNTER — Ambulatory Visit: Admit: 2020-03-05 | Discharge: 2020-03-06 | Payer: PRIVATE HEALTH INSURANCE

## 2020-03-05 DIAGNOSIS — D481 Neoplasm of uncertain behavior of connective and other soft tissue: Principal | ICD-10-CM

## 2020-03-05 DIAGNOSIS — Z86010 Personal history of colonic polyps: Principal | ICD-10-CM

## 2020-03-05 DIAGNOSIS — K219 Gastro-esophageal reflux disease without esophagitis: Principal | ICD-10-CM

## 2020-03-05 DIAGNOSIS — Q8789 Other specified congenital malformation syndromes, not elsewhere classified: Principal | ICD-10-CM

## 2020-03-05 DIAGNOSIS — R200 Anesthesia of skin: Principal | ICD-10-CM

## 2020-03-06 ENCOUNTER — Encounter: Admit: 2020-03-06 | Discharge: 2020-03-06 | Payer: PRIVATE HEALTH INSURANCE

## 2020-03-06 DIAGNOSIS — Z86010 Personal history of colonic polyps: Principal | ICD-10-CM

## 2020-03-06 DIAGNOSIS — Q8789 Other specified congenital malformation syndromes, not elsewhere classified: Principal | ICD-10-CM

## 2020-03-06 DIAGNOSIS — R200 Anesthesia of skin: Principal | ICD-10-CM

## 2020-03-06 DIAGNOSIS — D481 Neoplasm of uncertain behavior of connective and other soft tissue: Principal | ICD-10-CM

## 2020-03-06 DIAGNOSIS — K219 Gastro-esophageal reflux disease without esophagitis: Principal | ICD-10-CM

## 2020-03-06 MED ORDER — OXYCODONE 5 MG TABLET
ORAL_TABLET | Freq: Three times a day (TID) | ORAL | 0 refills | 1.00000 days | Status: CP | PRN
Start: 2020-03-06 — End: 2020-03-11

## 2020-03-06 MED ORDER — KETOROLAC 10 MG TABLET
ORAL_TABLET | Freq: Three times a day (TID) | ORAL | 0 refills | 5.00000 days | Status: CP | PRN
Start: 2020-03-06 — End: 2020-03-11

## 2020-03-12 DIAGNOSIS — D126 Benign neoplasm of colon, unspecified: Principal | ICD-10-CM

## 2020-03-12 DIAGNOSIS — Z86010 Personal history of colonic polyps: Principal | ICD-10-CM

## 2020-03-12 DIAGNOSIS — D125 Benign neoplasm of sigmoid colon: Principal | ICD-10-CM

## 2020-03-12 DIAGNOSIS — D692 Other nonthrombocytopenic purpura: Principal | ICD-10-CM

## 2020-03-12 DIAGNOSIS — D124 Benign neoplasm of descending colon: Principal | ICD-10-CM

## 2020-03-12 DIAGNOSIS — K317 Polyp of stomach and duodenum: Principal | ICD-10-CM

## 2020-03-12 DIAGNOSIS — I38 Endocarditis, valve unspecified: Principal | ICD-10-CM

## 2020-03-12 DIAGNOSIS — D500 Iron deficiency anemia secondary to blood loss (chronic): Principal | ICD-10-CM

## 2020-03-12 DIAGNOSIS — K2930 Chronic superficial gastritis without bleeding: Principal | ICD-10-CM

## 2020-03-12 DIAGNOSIS — K621 Rectal polyp: Principal | ICD-10-CM

## 2020-03-12 DIAGNOSIS — D123 Benign neoplasm of transverse colon: Principal | ICD-10-CM

## 2020-03-13 ENCOUNTER — Encounter
Admit: 2020-03-13 | Discharge: 2020-03-13 | Payer: PRIVATE HEALTH INSURANCE | Attending: Critical Care Medicine | Primary: Critical Care Medicine

## 2020-03-13 ENCOUNTER — Encounter: Admit: 2020-03-13 | Discharge: 2020-03-13 | Payer: PRIVATE HEALTH INSURANCE

## 2020-03-13 DIAGNOSIS — D125 Benign neoplasm of sigmoid colon: Principal | ICD-10-CM

## 2020-03-13 DIAGNOSIS — K621 Rectal polyp: Principal | ICD-10-CM

## 2020-03-13 DIAGNOSIS — D126 Benign neoplasm of colon, unspecified: Principal | ICD-10-CM

## 2020-03-13 DIAGNOSIS — Z86010 Personal history of colonic polyps: Principal | ICD-10-CM

## 2020-03-13 DIAGNOSIS — K317 Polyp of stomach and duodenum: Principal | ICD-10-CM

## 2020-03-13 DIAGNOSIS — I38 Endocarditis, valve unspecified: Principal | ICD-10-CM

## 2020-03-13 DIAGNOSIS — D692 Other nonthrombocytopenic purpura: Principal | ICD-10-CM

## 2020-03-13 DIAGNOSIS — K2930 Chronic superficial gastritis without bleeding: Principal | ICD-10-CM

## 2020-03-13 DIAGNOSIS — D124 Benign neoplasm of descending colon: Principal | ICD-10-CM

## 2020-03-13 DIAGNOSIS — D123 Benign neoplasm of transverse colon: Principal | ICD-10-CM

## 2020-03-13 DIAGNOSIS — D500 Iron deficiency anemia secondary to blood loss (chronic): Principal | ICD-10-CM

## 2020-03-13 DIAGNOSIS — Z1211 Encounter for screening for malignant neoplasm of colon: Secondary | ICD-10-CM | POA: Diagnosis not present

## 2020-03-13 DIAGNOSIS — K635 Polyp of colon: Secondary | ICD-10-CM | POA: Diagnosis not present

## 2020-03-13 MED ORDER — MIRTAZAPINE 7.5 MG TABLET
ORAL_TABLET | Freq: Every evening | ORAL | 3 refills | 90 days | Status: CP
Start: 2020-03-13 — End: 2020-04-12

## 2020-03-15 ENCOUNTER — Encounter: Admit: 2020-03-15 | Discharge: 2020-03-16 | Payer: PRIVATE HEALTH INSURANCE

## 2020-03-15 DIAGNOSIS — Q8789 Other specified congenital malformation syndromes, not elsewhere classified: Principal | ICD-10-CM

## 2020-03-19 MED ORDER — DULOXETINE 20 MG CAPSULE,DELAYED RELEASE
ORAL_CAPSULE | 1 refills | 0 days | Status: CP
Start: 2020-03-19 — End: ?

## 2020-03-23 DIAGNOSIS — Q8789 Other specified congenital malformation syndromes, not elsewhere classified: Principal | ICD-10-CM

## 2020-04-17 ENCOUNTER — Encounter: Admit: 2020-04-17 | Discharge: 2020-04-18 | Payer: PRIVATE HEALTH INSURANCE

## 2020-04-17 DIAGNOSIS — D481 Neoplasm of uncertain behavior of connective and other soft tissue: Principal | ICD-10-CM

## 2020-04-17 DIAGNOSIS — F419 Anxiety disorder, unspecified: Principal | ICD-10-CM

## 2020-04-17 DIAGNOSIS — Z515 Encounter for palliative care: Principal | ICD-10-CM

## 2020-05-09 MED ORDER — CYANOCOBALAMIN (VIT B-12) 1,000 MCG/ML INJECTION SOLUTION
0 days
Start: 2020-05-09 — End: ?

## 2020-05-16 MED ORDER — HYOSCYAMINE 0.125 MG SUBLINGUAL TABLET
ORAL_TABLET | SUBLINGUAL | 1 refills | 10 days | Status: CP | PRN
Start: 2020-05-16 — End: ?

## 2020-05-21 ENCOUNTER — Encounter: Admit: 2020-05-21 | Discharge: 2020-05-22 | Payer: PRIVATE HEALTH INSURANCE | Attending: Surgery | Primary: Surgery

## 2020-05-21 DIAGNOSIS — D126 Benign neoplasm of colon, unspecified: Principal | ICD-10-CM

## 2020-05-22 ENCOUNTER — Encounter: Admit: 2020-05-22 | Discharge: 2020-05-22 | Payer: PRIVATE HEALTH INSURANCE

## 2020-05-22 DIAGNOSIS — D500 Iron deficiency anemia secondary to blood loss (chronic): Principal | ICD-10-CM

## 2020-05-22 DIAGNOSIS — D126 Benign neoplasm of colon, unspecified: Principal | ICD-10-CM

## 2020-05-22 DIAGNOSIS — Q8789 Other specified congenital malformation syndromes, not elsewhere classified: Principal | ICD-10-CM

## 2020-05-22 DIAGNOSIS — Z86010 Personal history of colonic polyps: Principal | ICD-10-CM

## 2020-05-22 DIAGNOSIS — D481 Neoplasm of uncertain behavior of connective and other soft tissue: Principal | ICD-10-CM

## 2020-05-22 DIAGNOSIS — F419 Anxiety disorder, unspecified: Secondary | ICD-10-CM | POA: Diagnosis not present

## 2020-05-22 DIAGNOSIS — E079 Disorder of thyroid, unspecified: Secondary | ICD-10-CM | POA: Diagnosis not present

## 2020-05-22 DIAGNOSIS — Z79899 Other long term (current) drug therapy: Secondary | ICD-10-CM | POA: Diagnosis not present

## 2020-05-22 DIAGNOSIS — Z515 Encounter for palliative care: Secondary | ICD-10-CM | POA: Diagnosis not present

## 2020-05-22 MED ORDER — ONDANSETRON HCL 4 MG TABLET
ORAL_TABLET | 0 refills | 0 days | Status: CP
Start: 2020-05-22 — End: ?

## 2020-05-22 MED ORDER — METRONIDAZOLE 500 MG TABLET
ORAL_TABLET | 0 refills | 0 days | Status: CP
Start: 2020-05-22 — End: ?

## 2020-05-22 MED ORDER — SODIUM,POTASSIUM,MAG SULFATES 17.5 GRAM-3.13 GRAM-1.6 GRAM ORAL SOLN
0 refills | 0 days | Status: CP
Start: 2020-05-22 — End: ?

## 2020-05-30 ENCOUNTER — Encounter: Admit: 2020-05-30 | Discharge: 2020-05-31 | Payer: PRIVATE HEALTH INSURANCE

## 2020-05-30 DIAGNOSIS — Q8789 Other specified congenital malformation syndromes, not elsewhere classified: Principal | ICD-10-CM

## 2020-05-30 DIAGNOSIS — D481 Neoplasm of uncertain behavior of connective and other soft tissue: Principal | ICD-10-CM

## 2020-07-13 DIAGNOSIS — Z01419 Encounter for gynecological examination (general) (routine) without abnormal findings: Secondary | ICD-10-CM | POA: Diagnosis not present

## 2020-07-13 DIAGNOSIS — Z681 Body mass index (BMI) 19 or less, adult: Secondary | ICD-10-CM | POA: Diagnosis not present

## 2020-07-16 ENCOUNTER — Other Ambulatory Visit: Payer: Self-pay | Admitting: Obstetrics & Gynecology

## 2020-07-16 DIAGNOSIS — N63 Unspecified lump in unspecified breast: Secondary | ICD-10-CM

## 2020-08-02 ENCOUNTER — Encounter: Admit: 2020-08-02 | Discharge: 2020-08-02 | Payer: PRIVATE HEALTH INSURANCE

## 2020-08-02 ENCOUNTER — Ambulatory Visit: Admit: 2020-08-02 | Discharge: 2020-08-02 | Payer: PRIVATE HEALTH INSURANCE

## 2020-08-02 DIAGNOSIS — D126 Benign neoplasm of colon, unspecified: Principal | ICD-10-CM

## 2020-08-02 DIAGNOSIS — Z01818 Encounter for other preprocedural examination: Secondary | ICD-10-CM | POA: Diagnosis not present

## 2020-08-03 ENCOUNTER — Ambulatory Visit
Admission: RE | Admit: 2020-08-03 | Discharge: 2020-08-03 | Disposition: A | Discharge status: Home / Self Care | Payer: BC Managed Care – PPO | Source: Ambulatory Visit | Attending: Obstetrics & Gynecology | Admitting: Obstetrics & Gynecology

## 2020-08-03 ENCOUNTER — Other Ambulatory Visit: Payer: BC Managed Care – PPO

## 2020-08-03 ENCOUNTER — Other Ambulatory Visit: Payer: Self-pay

## 2020-08-03 DIAGNOSIS — N63 Unspecified lump in unspecified breast: Secondary | ICD-10-CM

## 2020-08-03 DIAGNOSIS — N644 Mastodynia: Secondary | ICD-10-CM | POA: Diagnosis not present

## 2020-08-03 DIAGNOSIS — N6321 Unspecified lump in the left breast, upper outer quadrant: Secondary | ICD-10-CM | POA: Diagnosis not present

## 2020-08-08 ENCOUNTER — Encounter
Admit: 2020-08-08 | Discharge: 2020-08-09 | Payer: PRIVATE HEALTH INSURANCE | Attending: Registered" | Primary: Registered"

## 2020-08-10 ENCOUNTER — Ambulatory Visit
Admit: 2020-08-10 | Discharge: 2020-08-29 | Disposition: A | Discharge status: Home / Self Care | Payer: PRIVATE HEALTH INSURANCE | Admitting: Surgery

## 2020-08-10 ENCOUNTER — Encounter
Admit: 2020-08-10 | Discharge: 2020-08-29 | Disposition: A | Discharge status: Home / Self Care | Payer: PRIVATE HEALTH INSURANCE | Attending: Anesthesiology | Admitting: Surgery

## 2020-08-10 ENCOUNTER — Encounter
Admit: 2020-08-10 | Discharge: 2020-08-29 | Disposition: A | Discharge status: Home / Self Care | Payer: PRIVATE HEALTH INSURANCE | Attending: Registered Nurse | Admitting: Surgery

## 2020-08-10 DIAGNOSIS — K7689 Other specified diseases of liver: Secondary | ICD-10-CM | POA: Diagnosis not present

## 2020-08-10 DIAGNOSIS — K567 Ileus, unspecified: Secondary | ICD-10-CM | POA: Diagnosis not present

## 2020-08-10 DIAGNOSIS — Z9079 Acquired absence of other genital organ(s): Secondary | ICD-10-CM | POA: Diagnosis not present

## 2020-08-10 DIAGNOSIS — D500 Iron deficiency anemia secondary to blood loss (chronic): Secondary | ICD-10-CM | POA: Diagnosis not present

## 2020-08-10 DIAGNOSIS — Z681 Body mass index (BMI) 19 or less, adult: Secondary | ICD-10-CM | POA: Diagnosis not present

## 2020-08-10 DIAGNOSIS — K219 Gastro-esophageal reflux disease without esophagitis: Secondary | ICD-10-CM | POA: Diagnosis not present

## 2020-08-10 DIAGNOSIS — D126 Benign neoplasm of colon, unspecified: Secondary | ICD-10-CM | POA: Diagnosis not present

## 2020-08-10 DIAGNOSIS — R0600 Dyspnea, unspecified: Secondary | ICD-10-CM | POA: Diagnosis not present

## 2020-08-10 DIAGNOSIS — Z8371 Family history of colonic polyps: Secondary | ICD-10-CM | POA: Diagnosis not present

## 2020-08-10 DIAGNOSIS — F419 Anxiety disorder, unspecified: Secondary | ICD-10-CM | POA: Diagnosis not present

## 2020-08-10 DIAGNOSIS — R140 Abdominal distension (gaseous): Secondary | ICD-10-CM | POA: Diagnosis not present

## 2020-08-10 DIAGNOSIS — R451 Restlessness and agitation: Secondary | ICD-10-CM | POA: Diagnosis not present

## 2020-08-10 DIAGNOSIS — F4322 Adjustment disorder with anxiety: Secondary | ICD-10-CM | POA: Diagnosis not present

## 2020-08-10 DIAGNOSIS — Z9049 Acquired absence of other specified parts of digestive tract: Secondary | ICD-10-CM | POA: Diagnosis not present

## 2020-08-10 DIAGNOSIS — Z8616 Personal history of COVID-19: Secondary | ICD-10-CM | POA: Diagnosis not present

## 2020-08-10 DIAGNOSIS — R0602 Shortness of breath: Secondary | ICD-10-CM | POA: Diagnosis not present

## 2020-08-10 DIAGNOSIS — E440 Moderate protein-calorie malnutrition: Secondary | ICD-10-CM | POA: Diagnosis not present

## 2020-08-10 DIAGNOSIS — Z4682 Encounter for fitting and adjustment of non-vascular catheter: Secondary | ICD-10-CM | POA: Diagnosis not present

## 2020-08-10 DIAGNOSIS — R918 Other nonspecific abnormal finding of lung field: Secondary | ICD-10-CM | POA: Diagnosis not present

## 2020-08-10 DIAGNOSIS — Z8719 Personal history of other diseases of the digestive system: Secondary | ICD-10-CM | POA: Diagnosis not present

## 2020-08-10 DIAGNOSIS — D128 Benign neoplasm of rectum: Secondary | ICD-10-CM | POA: Diagnosis not present

## 2020-08-10 DIAGNOSIS — N281 Cyst of kidney, acquired: Secondary | ICD-10-CM | POA: Diagnosis not present

## 2020-08-16 MED ORDER — MISCELLANEOUS MEDICAL SUPPLY MISC
99 refills | 0 days
Start: 2020-08-16 — End: ?

## 2020-08-16 MED ORDER — OXYCODONE 5 MG TABLET
ORAL_TABLET | ORAL | 0 refills | 2 days | PRN
Start: 2020-08-16 — End: 2020-08-21

## 2020-08-17 MED ORDER — OXYCODONE 5 MG TABLET
ORAL_TABLET | ORAL | 0 refills | 2 days | PRN
Start: 2020-08-17 — End: 2020-08-22

## 2020-08-21 MED ORDER — MISCELLANEOUS MEDICAL SUPPLY MISC
99 refills | 0 days
Start: 2020-08-21 — End: ?

## 2020-08-29 MED ORDER — METHOCARBAMOL 500 MG TABLET: 0 refills | 0 days | Status: CP

## 2020-08-29 MED ORDER — MISCELLANEOUS MEDICAL SUPPLY MISC
99 refills | 0 days | Status: CP
Start: 2020-08-29 — End: ?

## 2020-08-29 MED ORDER — METHOCARBAMOL 500 MG TABLET
ORAL_TABLET | Freq: Four times a day (QID) | ORAL | 0 refills | 0.00000 days | Status: CP | PRN
Start: 2020-08-29 — End: ?
  Filled 2020-08-29: qty 30, 4d supply, fill #0

## 2020-08-29 MED ORDER — OXYCODONE 5 MG TABLET
ORAL_TABLET | Freq: Three times a day (TID) | ORAL | 0 refills | 4 days | Status: CP | PRN
Start: 2020-08-29 — End: 2020-09-03
  Filled 2020-08-29: qty 10, 4d supply, fill #0

## 2020-08-29 MED ORDER — METHOCARBAMOL 100 MG/ML INJECTION SOLUTION
Freq: Three times a day (TID) | INTRAVENOUS | 0 refills | 1 days | Status: CP
Start: 2020-08-29 — End: 2020-08-29

## 2020-08-30 DIAGNOSIS — R110 Nausea: Principal | ICD-10-CM

## 2020-08-30 DIAGNOSIS — D126 Benign neoplasm of colon, unspecified: Principal | ICD-10-CM

## 2020-08-30 MED ORDER — ONDANSETRON HCL 4 MG TABLET
ORAL_TABLET | Freq: Four times a day (QID) | ORAL | 0 refills | 5 days | Status: CP | PRN
Start: 2020-08-30 — End: ?

## 2020-09-02 DIAGNOSIS — R109 Unspecified abdominal pain: Secondary | ICD-10-CM | POA: Diagnosis not present

## 2020-09-02 DIAGNOSIS — T82848A Pain from vascular prosthetic devices, implants and grafts, initial encounter: Secondary | ICD-10-CM | POA: Diagnosis not present

## 2020-09-02 DIAGNOSIS — Z20822 Contact with and (suspected) exposure to covid-19: Secondary | ICD-10-CM | POA: Diagnosis not present

## 2020-09-02 DIAGNOSIS — K219 Gastro-esophageal reflux disease without esophagitis: Secondary | ICD-10-CM | POA: Diagnosis not present

## 2020-09-02 DIAGNOSIS — E860 Dehydration: Secondary | ICD-10-CM | POA: Diagnosis not present

## 2020-09-02 DIAGNOSIS — Z9089 Acquired absence of other organs: Secondary | ICD-10-CM | POA: Diagnosis not present

## 2020-09-02 DIAGNOSIS — Z681 Body mass index (BMI) 19 or less, adult: Secondary | ICD-10-CM | POA: Diagnosis not present

## 2020-09-02 DIAGNOSIS — F419 Anxiety disorder, unspecified: Secondary | ICD-10-CM | POA: Diagnosis not present

## 2020-09-02 DIAGNOSIS — D126 Benign neoplasm of colon, unspecified: Secondary | ICD-10-CM | POA: Diagnosis not present

## 2020-09-02 DIAGNOSIS — Y849 Medical procedure, unspecified as the cause of abnormal reaction of the patient, or of later complication, without mention of misadventure at the time of the procedure: Secondary | ICD-10-CM | POA: Diagnosis not present

## 2020-09-02 DIAGNOSIS — R110 Nausea: Secondary | ICD-10-CM | POA: Diagnosis not present

## 2020-09-02 DIAGNOSIS — R112 Nausea with vomiting, unspecified: Secondary | ICD-10-CM | POA: Diagnosis not present

## 2020-09-02 DIAGNOSIS — R1084 Generalized abdominal pain: Secondary | ICD-10-CM | POA: Diagnosis not present

## 2020-09-02 DIAGNOSIS — Z792 Long term (current) use of antibiotics: Secondary | ICD-10-CM | POA: Diagnosis not present

## 2020-09-02 DIAGNOSIS — R531 Weakness: Secondary | ICD-10-CM | POA: Diagnosis not present

## 2020-09-02 DIAGNOSIS — Z8616 Personal history of COVID-19: Secondary | ICD-10-CM | POA: Diagnosis not present

## 2020-09-02 DIAGNOSIS — Z885 Allergy status to narcotic agent status: Secondary | ICD-10-CM | POA: Diagnosis not present

## 2020-09-02 DIAGNOSIS — E43 Unspecified severe protein-calorie malnutrition: Secondary | ICD-10-CM | POA: Diagnosis not present

## 2020-09-02 DIAGNOSIS — R000 Tachycardia, unspecified: Secondary | ICD-10-CM | POA: Diagnosis not present

## 2020-09-02 DIAGNOSIS — R932 Abnormal findings on diagnostic imaging of liver and biliary tract: Secondary | ICD-10-CM | POA: Diagnosis not present

## 2020-09-02 DIAGNOSIS — N281 Cyst of kidney, acquired: Secondary | ICD-10-CM | POA: Diagnosis not present

## 2020-09-02 DIAGNOSIS — R42 Dizziness and giddiness: Secondary | ICD-10-CM | POA: Diagnosis not present

## 2020-09-02 DIAGNOSIS — Z9221 Personal history of antineoplastic chemotherapy: Secondary | ICD-10-CM | POA: Diagnosis not present

## 2020-09-02 DIAGNOSIS — Z932 Ileostomy status: Secondary | ICD-10-CM | POA: Diagnosis not present

## 2020-09-03 ENCOUNTER — Ambulatory Visit
Admit: 2020-09-03 | Discharge: 2020-09-07 | Disposition: A | Discharge status: Home / Self Care | Payer: PRIVATE HEALTH INSURANCE | Admitting: Surgery

## 2020-09-07 DIAGNOSIS — R112 Nausea with vomiting, unspecified: Secondary | ICD-10-CM | POA: Diagnosis not present

## 2020-09-07 DIAGNOSIS — R1084 Generalized abdominal pain: Secondary | ICD-10-CM | POA: Diagnosis not present

## 2020-09-07 DIAGNOSIS — Z932 Ileostomy status: Secondary | ICD-10-CM | POA: Diagnosis not present

## 2020-09-07 DIAGNOSIS — E860 Dehydration: Secondary | ICD-10-CM | POA: Diagnosis not present

## 2020-09-07 MED ORDER — OXYCODONE 5 MG TABLET
ORAL_TABLET | Freq: Four times a day (QID) | ORAL | 0 refills | 2 days | Status: CP | PRN
Start: 2020-09-07 — End: 2020-09-12
  Filled 2020-09-07: qty 5, 2d supply, fill #0

## 2020-09-08 ENCOUNTER — Other Ambulatory Visit: Payer: Self-pay

## 2020-09-08 ENCOUNTER — Emergency Department (HOSPITAL_COMMUNITY): Payer: BC Managed Care – PPO

## 2020-09-08 ENCOUNTER — Encounter (HOSPITAL_COMMUNITY): Payer: Self-pay

## 2020-09-08 ENCOUNTER — Emergency Department (HOSPITAL_COMMUNITY)
Admission: EM | Admit: 2020-09-08 | Discharge: 2020-09-08 | Disposition: A | Discharge status: Home / Self Care | Payer: BC Managed Care – PPO | Attending: Emergency Medicine | Admitting: Emergency Medicine

## 2020-09-08 DIAGNOSIS — R0789 Other chest pain: Secondary | ICD-10-CM | POA: Insufficient documentation

## 2020-09-08 DIAGNOSIS — M542 Cervicalgia: Secondary | ICD-10-CM | POA: Insufficient documentation

## 2020-09-08 DIAGNOSIS — R079 Chest pain, unspecified: Secondary | ICD-10-CM | POA: Diagnosis not present

## 2020-09-08 DIAGNOSIS — Z932 Ileostomy status: Secondary | ICD-10-CM | POA: Diagnosis not present

## 2020-09-08 DIAGNOSIS — R1084 Generalized abdominal pain: Secondary | ICD-10-CM | POA: Diagnosis not present

## 2020-09-08 DIAGNOSIS — R112 Nausea with vomiting, unspecified: Secondary | ICD-10-CM | POA: Diagnosis not present

## 2020-09-08 DIAGNOSIS — Z452 Encounter for adjustment and management of vascular access device: Secondary | ICD-10-CM | POA: Diagnosis not present

## 2020-09-08 DIAGNOSIS — Z85828 Personal history of other malignant neoplasm of skin: Secondary | ICD-10-CM | POA: Diagnosis not present

## 2020-09-08 DIAGNOSIS — E860 Dehydration: Secondary | ICD-10-CM | POA: Diagnosis not present

## 2020-09-08 DIAGNOSIS — Z0389 Encounter for observation for other suspected diseases and conditions ruled out: Secondary | ICD-10-CM | POA: Diagnosis not present

## 2020-09-08 DIAGNOSIS — R059 Cough, unspecified: Secondary | ICD-10-CM | POA: Diagnosis not present

## 2020-09-08 DIAGNOSIS — R0602 Shortness of breath: Secondary | ICD-10-CM | POA: Insufficient documentation

## 2020-09-08 DIAGNOSIS — R000 Tachycardia, unspecified: Secondary | ICD-10-CM | POA: Diagnosis not present

## 2020-09-08 HISTORY — DX: Neoplasm of uncertain behavior of connective and other soft tissue: D48.1

## 2020-09-08 HISTORY — DX: Anemia, unspecified: D64.9

## 2020-09-08 HISTORY — DX: Desmoid tumor of unspecified site: D48.119

## 2020-09-08 LAB — CBC WITH DIFFERENTIAL/PLATELET
Abs Immature Granulocytes: 0.07 10*3/uL (ref 0.00–0.07)
Basophils Absolute: 0 10*3/uL (ref 0.0–0.1)
Basophils Relative: 0 %
Eosinophils Absolute: 0.2 10*3/uL (ref 0.0–0.5)
Eosinophils Relative: 2 %
HCT: 36.5 % (ref 36.0–46.0)
Hemoglobin: 12.2 g/dL (ref 12.0–15.0)
Immature Granulocytes: 1 %
Lymphocytes Relative: 20 %
Lymphs Abs: 2.1 10*3/uL (ref 0.7–4.0)
MCH: 29.2 pg (ref 26.0–34.0)
MCHC: 33.4 g/dL (ref 30.0–36.0)
MCV: 87.3 fL (ref 80.0–100.0)
Monocytes Absolute: 0.9 10*3/uL (ref 0.1–1.0)
Monocytes Relative: 8 %
Neutro Abs: 7.2 10*3/uL (ref 1.7–7.7)
Neutrophils Relative %: 69 %
Platelets: 219 10*3/uL (ref 150–400)
RBC: 4.18 MIL/uL (ref 3.87–5.11)
RDW: 12.3 % (ref 11.5–15.5)
WBC: 10.4 10*3/uL (ref 4.0–10.5)
nRBC: 0 % (ref 0.0–0.2)

## 2020-09-08 LAB — COMPREHENSIVE METABOLIC PANEL
ALT: 42 U/L (ref 0–44)
AST: 29 U/L (ref 15–41)
Albumin: 3.8 g/dL (ref 3.5–5.0)
Alkaline Phosphatase: 66 U/L (ref 38–126)
Anion gap: 7 (ref 5–15)
BUN: 14 mg/dL (ref 6–20)
CO2: 26 mmol/L (ref 22–32)
Calcium: 9.1 mg/dL (ref 8.9–10.3)
Chloride: 105 mmol/L (ref 98–111)
Creatinine, Ser: 0.41 mg/dL — ABNORMAL LOW (ref 0.44–1.00)
GFR, Estimated: >60 mL/min (ref >60)
Glucose, Bld: 91 mg/dL (ref 70–99)
Potassium: 4 mmol/L (ref 3.5–5.1)
Sodium: 138 mmol/L (ref 135–145)
Total Bilirubin: 0.3 mg/dL (ref 0.3–1.2)
Total Protein: 6.7 g/dL (ref 6.5–8.1)

## 2020-09-08 LAB — I-STAT BETA HCG BLOOD, ED (MC, WL, AP ONLY): I-stat hCG, quantitative: <5 m[IU]/mL (ref <5)

## 2020-09-08 LAB — TROPONIN I (HIGH SENSITIVITY): Troponin I (High Sensitivity): <2 ng/L (ref <18)

## 2020-09-08 MED ORDER — HYDROMORPHONE HCL 1 MG/ML IJ SOLN
0.5000 mg | Freq: Once | INTRAMUSCULAR | Status: AC
  Administered 2020-09-08: 0.5 mg via INTRAVENOUS
  Filled 2020-09-08: qty 1

## 2020-09-08 MED ORDER — HEPARIN SOD (PORK) LOCK FLUSH 100 UNIT/ML IV SOLN
500.0000 [IU] | Freq: Once | INTRAVENOUS | Status: AC
  Administered 2020-09-08: 500 [IU]
  Filled 2020-09-08: qty 5

## 2020-09-08 MED ORDER — IOHEXOL 350 MG/ML SOLN
75.0000 mL | Freq: Once | INTRAVENOUS | Status: AC | PRN
  Administered 2020-09-08: 75 mL via INTRAVENOUS

## 2020-09-08 MED ORDER — SODIUM CHLORIDE (PF) 0.9 % IJ SOLN
INTRAMUSCULAR | Status: AC
  Filled 2020-09-08: qty 50

## 2020-09-08 MED ORDER — LACTATED RINGERS IV BOLUS
1000.0000 mL | Freq: Once | INTRAVENOUS | Status: AC
  Administered 2020-09-08: 1000 mL via INTRAVENOUS

## 2020-09-08 MED ORDER — DIPHENHYDRAMINE HCL 50 MG/ML IJ SOLN
25.0000 mg | Freq: Once | INTRAMUSCULAR | Status: AC
  Administered 2020-09-08: 25 mg via INTRAVENOUS
  Filled 2020-09-08: qty 1

## 2020-09-08 NOTE — ED Notes (Signed)
Pt and mother concerned about possible air boluses d/t malfunctioning pump and tubing during TPN infusion.

## 2020-09-08 NOTE — ED Notes (Signed)
Pt to CT via stretcher

## 2020-09-08 NOTE — ED Provider Notes (Signed)
[facility] DEPT Provider Note   CSN: [id number] Arrival date & time: 09/08/20  1659     History Chief Complaint  Patient presents with   Vascular Access Problem   Chest Pain   Shortness of Breath    Lisa Donovan is a 20 y.o. female.  HPI 20 year old female presents with sudden onset right-sided chest and neck pain, coughing and trouble breathing.  She recently was admitted to [facility] for a prolonged period of time after her surgery requiring TPN and then readmitted and discharged yesterday with a new chest central line and TPN.  There were some issues with tubing and medicine but it was ultimately started around 2 AM this morning.  Seem to be doing okay and when the nurse changed out the lines this morning she used some extensions to give more room.  However the patient states that she took off the To the central line and took this directly to the line itself.  At around 3 PM the patient was changing this out and immediately after taking off the extension tubing she felt the sudden symptoms.  Felt like her throat was closing.  The symptoms have improved but she still has a heaviness to her chest and right neck.  She is also been coughing whenever she takes a deep breath.  Past Medical History:  Diagnosis Date   Anemia    Colon polyps    Dx at age 76, has colonoscopy yearly   [name]    [name]    Dx at age 50   GERD (gastroesophageal reflux disease)    H/O colonoscopy    Pt has has them yearly   History of endoscopy     Patient Active Problem List   Diagnosis Date Noted   Pain 08/04/2019   Chemotherapy-induced nausea 05/14/2019   Syncope 05/14/2019   Thyroid cyst 10/05/2018   [name] of skin 08/25/2012   Intestinal polyps 08/25/2012   Gardner's syndrome 07/14/2012   Other benign neoplasm of connective and other soft tissue of upper limb, including shoulder 02/26/2011   Benign neoplasm of skin of trunk 01/17/2004     Past Surgical History:  Procedure Laterality Date   COLOPROCTECTOMY W/ ILEO J POUCH     Oral surgery     Tumor removed     Numerous surgies since she was 3 months old     OB History   No obstetric history on file.     No family history on file.  Social History   Tobacco Use   Smoking status: Never   Smokeless tobacco: Never  Vaping Use   Vaping Use: Never used  Substance Use Topics   Alcohol use: Never   Drug use: Never    Home Medications Prior to Admission medications   Medication Sig Start Date End Date Taking? Authorizing Provider  hyoscyamine (LEVSIN SL) 0.125 MG SL tablet Take 0.125 mg by mouth every 6 (six) hours as needed for cramping. 05/16/20  Yes [provider]  Ibuprofen (ADVIL LIQUI-GELS MINIS) 200 MG CAPS Take 1 capsule by mouth daily as needed (pain).   Yes [provider]  methocarbamol (ROBAXIN) 500 MG tablet Take 500-1,000 mg by mouth every 6 (six) hours as needed for muscle spasms. 08/29/20  Yes [provider]  mirtazapine (REMERON) 7.5 MG tablet Take 7.5 mg by mouth at bedtime. 06/08/20  Yes [provider]  Multiple Vitamin (MULTIVITAMIN ADULT PO) Inject 10 mLs into the skin daily.  Inject in TPN   Yes [provider]  omeprazole (PRILOSEC) 40 MG capsule Take 40 mg by mouth daily as needed (acid reflux). 08/24/19  Yes [provider]  ondansetron (ZOFRAN) 4 MG tablet Take 4 mg by mouth every 6 (six) hours as needed for nausea or vomiting. 08/30/20  Yes [provider]  oxyCODONE (OXY IR/ROXICODONE) 5 MG immediate release tablet Take 5 mg by mouth every 6 (six) hours as needed for moderate pain or severe pain. 09/07/20  Yes [provider]  benzonatate (TESSALON) 100 MG capsule Take 1 capsule (100 mg total) by mouth 3 (three) times daily as needed for cough. Patient not taking: Reported on 09/08/2020 01/31/20   Inda Coke, PA  chlorpheniramine-HYDROcodone [facility] ER)  10-8 MG/5ML SUER Take 5 mLs by mouth at bedtime as needed for cough. Patient not taking: Reported on 09/08/2020 01/31/20   Inda Coke, PA    Allergies    Compazine [prochlorperazine], Morphine and related, Zosyn [piperacillin-tazobactam in dex], and Neomycin  Review of Systems   Review of Systems  Respiratory:  Positive for cough and shortness of breath.   Cardiovascular:  Positive for chest pain.  Musculoskeletal:  Positive for neck pain.  All other systems reviewed and are negative.  Physical Exam Updated Vital Signs BP 121/78   Pulse 85   Temp 98.7 F (37.1 C) (Oral)   Resp 16   Ht 5\' 5"  (1.651 m)   Wt 48.5 kg   SpO2 100%   BMI 17.81 kg/m   Physical Exam Vitals and nursing note reviewed.  Constitutional:      Appearance: She is well-developed.  HENT:     Head: Normocephalic and atraumatic.     Right Ear: External ear normal.     Left Ear: External ear normal.     Nose: Nose normal.     Mouth/Throat:     Comments: Normal phonation, no stridor Eyes:     General:        Right eye: No discharge.        Left eye: No discharge.  Neck:     Comments: No neck swelling Cardiovascular:     Rate and Rhythm: Normal rate and regular rhythm.     Heart sounds: Normal heart sounds.  Pulmonary:     Effort: Pulmonary effort is normal.     Breath sounds: Normal breath sounds.  Chest:     Chest wall: Tenderness (mild tenderness to right anterior chest. central line apperas grossly  normal) present.  Abdominal:     Palpations: Abdomen is soft.     Tenderness: There is no abdominal tenderness.  Skin:    General: Skin is warm and dry.     Comments: No crepitus to the neck/chest  Neurological:     Mental Status: She is alert.  Psychiatric:        Mood and Affect: Mood is not anxious.    ED Results / Procedures / Treatments   Labs (all labs ordered are listed, but only abnormal results are displayed) Labs Reviewed  COMPREHENSIVE METABOLIC PANEL - Abnormal; Notable for  the following components:      Result Value   Creatinine, Ser 0.41 (*)    All other components within normal limits  CBC WITH DIFFERENTIAL/PLATELET  I-STAT BETA HCG BLOOD, ED ([facility], [facility], [facility] ONLY)  TROPONIN I (HIGH SENSITIVITY)  TROPONIN I (HIGH SENSITIVITY)    EKG None  Radiology CT Angio Chest PE W and/or Wo Contrast  Result  Date: 09/08/2020 CLINICAL DATA:  PE suspected. High probability. Recent abdominal surgery. EXAM: CT ANGIOGRAPHY CHEST WITH CONTRAST TECHNIQUE: Multidetector CT imaging of the chest was performed using the standard protocol during bolus administration of intravenous contrast. Multiplanar CT image reconstructions and MIPs were obtained to evaluate the vascular anatomy. CONTRAST:  96mL OMNIPAQUE IOHEXOL 350 MG/ML SOLN COMPARISON:  None. FINDINGS: Cardiovascular: No filling defects within the pulmonary arteries to suggest acute pulmonary embolism. No pericardial effusion. Central venous line with tip in the distal SVC.  Its 20 Mediastinum/Nodes: No axillary or supraclavicular adenopathy. No mediastinal or hilar adenopathy. No pericardial fluid. Esophagus normal. Lungs/Pleura: No pulmonary infarction. No pneumonia. No pleural fluid. No pneumothorax. Upper Abdomen: Limited view of the liver, kidneys, pancreas are unremarkable. Normal adrenal glands. Musculoskeletal: No acute osseous abnormality. Review of the MIP images confirms the above findings. IMPRESSION: 1. No pulmonary embolism. 2. No acute pulmonary parenchymal findings. Electronically Signed   By: Suzy Bouchard M.D.   On: 09/08/2020 21:40   DG Chest Portable 1 View  Result Date: 09/08/2020 CLINICAL DATA:  Confirm PICC positioning. Chest line, evaluate placement. EXAM: PORTABLE CHEST 1 VIEW COMPARISON:  None. FINDINGS: Right central line is likely an internal jugular line, with tip overlying the mid SVC. No other central line is seen. Normal heart size and mediastinal contours. No focal airspace disease, pneumothorax, or  pulmonary edema. No pleural effusion. No acute osseous abnormalities are seen. Broad-based curvature of the thoracic spine may be positioning or scoliosis. IMPRESSION: 1. Right central line with tip overlying the mid SVC. 2. Clear lungs. Electronically Signed   By: Keith Rake M.D.   On: 09/08/2020 18:41    Procedures Procedures   Medications Ordered in ED Medications  lactated ringers bolus 1,000 mL (0 mLs Intravenous Stopped 09/08/20 2201)  HYDROmorphone (DILAUDID) injection 0.5 mg (0.5 mg Intravenous Given 09/08/20 1958)  diphenhydrAMINE (BENADRYL) injection 25 mg (25 mg Intravenous Given 09/08/20 2130)  iohexol (OMNIPAQUE) 350 MG/ML injection 75 mL (75 mLs Intravenous Contrast Given 09/08/20 2050)  sodium chloride (PF) 0.9 % injection (  Given by Other 09/08/20 2126)    ED Course  I have reviewed the triage vital signs and the nursing notes.  Pertinent labs & imaging results that were available during my care of the patient were reviewed by me and considered in my medical decision making (see chart for details).    MDM Rules/Calculators/A&P                          At this point, there is no clear cause for her symptoms.  She is afebrile.  She is tachycardic on arrival though this is improved with fluids.  Lab work is well-appearing.  There was some concern perhaps there was an air embolus though she is well-appearing here without headache or other distress.  CTA was obtained which is unremarkable.  While is possible she had a transient embolus, at this point there is no clear significant abnormality that would warrant her to need further treatment or admission.  There was a concern that maybe she had some rash around her chest and so she was given Benadryl.  Otherwise, there is no indication that she is having an anaphylactic reaction.  She appears well and stable for discharge home. Final Clinical Impression(s) / ED Diagnoses Final diagnoses:  Nonspecific chest pain    Rx / DC  Orders ED Discharge Orders     None  Sherwood Gambler, MD 09/08/20 2234

## 2020-09-08 NOTE — ED Notes (Signed)
Labs will be obtained once PICC placement is verified.

## 2020-09-08 NOTE — ED Notes (Signed)
ED Provider at bedside. 

## 2020-09-08 NOTE — Discharge Instructions (Addendum)
If you develop recurrent, continued, or worsening chest pain, shortness of breath, fever, vomiting, abdominal or back pain, or any other new/concerning symptoms then return to the ER for evaluation.  

## 2020-09-08 NOTE — ED Notes (Signed)
Pt c/o pain radiating down right arm and describes it as "funny bone pain" Pain is on the side of her central line. Fluids stopped and Dr. Regenia Skeeter notified of pt pain

## 2020-09-08 NOTE — ED Triage Notes (Signed)
Per EMS, Pt, from home, c/o chest pain, R neck pain, and SOB starting this afternoon after a TPN feeding.  Pt is also concerned her PICC line is displaced.  Pt reports home RN may have introduced a significant amount of air to the line.  Pt has a significant medical and surgical history.   Pt was able to tell this writer the full story w/o difficulty.

## 2020-09-09 DIAGNOSIS — R1084 Generalized abdominal pain: Secondary | ICD-10-CM | POA: Diagnosis not present

## 2020-09-09 DIAGNOSIS — R112 Nausea with vomiting, unspecified: Secondary | ICD-10-CM | POA: Diagnosis not present

## 2020-09-09 DIAGNOSIS — Z932 Ileostomy status: Secondary | ICD-10-CM | POA: Diagnosis not present

## 2020-09-09 DIAGNOSIS — E860 Dehydration: Secondary | ICD-10-CM | POA: Diagnosis not present

## 2020-09-10 DIAGNOSIS — R112 Nausea with vomiting, unspecified: Secondary | ICD-10-CM | POA: Diagnosis not present

## 2020-09-10 DIAGNOSIS — E860 Dehydration: Secondary | ICD-10-CM | POA: Diagnosis not present

## 2020-09-10 DIAGNOSIS — Z932 Ileostomy status: Secondary | ICD-10-CM | POA: Diagnosis not present

## 2020-09-10 DIAGNOSIS — R1084 Generalized abdominal pain: Secondary | ICD-10-CM | POA: Diagnosis not present

## 2020-09-11 DIAGNOSIS — Q8789 Other specified congenital malformation syndromes, not elsewhere classified: Principal | ICD-10-CM

## 2020-09-11 DIAGNOSIS — D481 Neoplasm of uncertain behavior of connective and other soft tissue: Principal | ICD-10-CM

## 2020-09-11 DIAGNOSIS — R112 Nausea with vomiting, unspecified: Secondary | ICD-10-CM | POA: Diagnosis not present

## 2020-09-11 DIAGNOSIS — R1084 Generalized abdominal pain: Secondary | ICD-10-CM | POA: Diagnosis not present

## 2020-09-11 DIAGNOSIS — Z932 Ileostomy status: Secondary | ICD-10-CM | POA: Diagnosis not present

## 2020-09-11 DIAGNOSIS — E860 Dehydration: Secondary | ICD-10-CM | POA: Diagnosis not present

## 2020-09-12 DIAGNOSIS — R1084 Generalized abdominal pain: Secondary | ICD-10-CM | POA: Diagnosis not present

## 2020-09-12 DIAGNOSIS — R112 Nausea with vomiting, unspecified: Secondary | ICD-10-CM | POA: Diagnosis not present

## 2020-09-12 DIAGNOSIS — E860 Dehydration: Secondary | ICD-10-CM | POA: Diagnosis not present

## 2020-09-12 DIAGNOSIS — Z932 Ileostomy status: Secondary | ICD-10-CM | POA: Diagnosis not present

## 2020-09-13 ENCOUNTER — Encounter: Admit: 2020-09-13 | Discharge: 2020-09-13 | Payer: PRIVATE HEALTH INSURANCE | Attending: Family | Primary: Family

## 2020-09-13 DIAGNOSIS — E860 Dehydration: Principal | ICD-10-CM

## 2020-09-13 DIAGNOSIS — R1084 Generalized abdominal pain: Principal | ICD-10-CM

## 2020-09-13 DIAGNOSIS — R112 Nausea with vomiting, unspecified: Principal | ICD-10-CM

## 2020-09-13 DIAGNOSIS — Z932 Ileostomy status: Principal | ICD-10-CM

## 2020-09-14 DIAGNOSIS — R1084 Generalized abdominal pain: Secondary | ICD-10-CM | POA: Diagnosis not present

## 2020-09-14 DIAGNOSIS — R112 Nausea with vomiting, unspecified: Secondary | ICD-10-CM | POA: Diagnosis not present

## 2020-09-14 DIAGNOSIS — Z932 Ileostomy status: Secondary | ICD-10-CM | POA: Diagnosis not present

## 2020-09-14 DIAGNOSIS — E860 Dehydration: Secondary | ICD-10-CM | POA: Diagnosis not present

## 2020-09-15 DIAGNOSIS — R112 Nausea with vomiting, unspecified: Secondary | ICD-10-CM | POA: Diagnosis not present

## 2020-09-15 DIAGNOSIS — R1084 Generalized abdominal pain: Secondary | ICD-10-CM | POA: Diagnosis not present

## 2020-09-15 DIAGNOSIS — Z932 Ileostomy status: Secondary | ICD-10-CM | POA: Diagnosis not present

## 2020-09-15 DIAGNOSIS — E860 Dehydration: Secondary | ICD-10-CM | POA: Diagnosis not present

## 2020-09-16 DIAGNOSIS — R112 Nausea with vomiting, unspecified: Secondary | ICD-10-CM | POA: Diagnosis not present

## 2020-09-16 DIAGNOSIS — Z932 Ileostomy status: Secondary | ICD-10-CM | POA: Diagnosis not present

## 2020-09-16 DIAGNOSIS — E860 Dehydration: Secondary | ICD-10-CM | POA: Diagnosis not present

## 2020-09-16 DIAGNOSIS — R1084 Generalized abdominal pain: Secondary | ICD-10-CM | POA: Diagnosis not present

## 2020-09-17 DIAGNOSIS — R112 Nausea with vomiting, unspecified: Secondary | ICD-10-CM | POA: Diagnosis not present

## 2020-09-17 DIAGNOSIS — E860 Dehydration: Secondary | ICD-10-CM | POA: Diagnosis not present

## 2020-09-17 DIAGNOSIS — Z932 Ileostomy status: Secondary | ICD-10-CM | POA: Diagnosis not present

## 2020-09-17 DIAGNOSIS — R1084 Generalized abdominal pain: Secondary | ICD-10-CM | POA: Diagnosis not present

## 2020-09-18 DIAGNOSIS — Z932 Ileostomy status: Secondary | ICD-10-CM | POA: Diagnosis not present

## 2020-09-18 DIAGNOSIS — R112 Nausea with vomiting, unspecified: Secondary | ICD-10-CM | POA: Diagnosis not present

## 2020-09-18 DIAGNOSIS — R1084 Generalized abdominal pain: Secondary | ICD-10-CM | POA: Diagnosis not present

## 2020-09-18 DIAGNOSIS — M6281 Muscle weakness (generalized): Secondary | ICD-10-CM | POA: Diagnosis not present

## 2020-09-18 DIAGNOSIS — E860 Dehydration: Secondary | ICD-10-CM | POA: Diagnosis not present

## 2020-09-19 ENCOUNTER — Encounter: Admit: 2020-09-19 | Discharge: 2020-09-19 | Payer: PRIVATE HEALTH INSURANCE | Attending: Family | Primary: Family

## 2020-09-19 ENCOUNTER — Encounter: Admit: 2020-09-19 | Discharge: 2020-09-20 | Payer: PRIVATE HEALTH INSURANCE | Attending: Surgery | Primary: Surgery

## 2020-09-19 DIAGNOSIS — R1084 Generalized abdominal pain: Principal | ICD-10-CM

## 2020-09-19 DIAGNOSIS — E860 Dehydration: Principal | ICD-10-CM

## 2020-09-19 DIAGNOSIS — D126 Benign neoplasm of colon, unspecified: Principal | ICD-10-CM

## 2020-09-19 DIAGNOSIS — R112 Nausea with vomiting, unspecified: Principal | ICD-10-CM

## 2020-09-19 DIAGNOSIS — Z932 Ileostomy status: Principal | ICD-10-CM

## 2020-09-20 DIAGNOSIS — R112 Nausea with vomiting, unspecified: Secondary | ICD-10-CM | POA: Diagnosis not present

## 2020-09-20 DIAGNOSIS — E860 Dehydration: Secondary | ICD-10-CM | POA: Diagnosis not present

## 2020-09-20 DIAGNOSIS — R1084 Generalized abdominal pain: Secondary | ICD-10-CM | POA: Diagnosis not present

## 2020-09-20 DIAGNOSIS — Z932 Ileostomy status: Secondary | ICD-10-CM | POA: Diagnosis not present

## 2020-09-20 DIAGNOSIS — M6281 Muscle weakness (generalized): Secondary | ICD-10-CM | POA: Diagnosis not present

## 2020-09-21 DIAGNOSIS — R1084 Generalized abdominal pain: Secondary | ICD-10-CM | POA: Diagnosis not present

## 2020-09-21 DIAGNOSIS — E860 Dehydration: Secondary | ICD-10-CM | POA: Diagnosis not present

## 2020-09-21 DIAGNOSIS — R112 Nausea with vomiting, unspecified: Secondary | ICD-10-CM | POA: Diagnosis not present

## 2020-09-21 DIAGNOSIS — Z932 Ileostomy status: Secondary | ICD-10-CM | POA: Diagnosis not present

## 2020-09-22 DIAGNOSIS — Z932 Ileostomy status: Secondary | ICD-10-CM | POA: Diagnosis not present

## 2020-09-22 DIAGNOSIS — R112 Nausea with vomiting, unspecified: Secondary | ICD-10-CM | POA: Diagnosis not present

## 2020-09-22 DIAGNOSIS — E860 Dehydration: Secondary | ICD-10-CM | POA: Diagnosis not present

## 2020-09-22 DIAGNOSIS — R1084 Generalized abdominal pain: Secondary | ICD-10-CM | POA: Diagnosis not present

## 2020-09-23 DIAGNOSIS — Z932 Ileostomy status: Secondary | ICD-10-CM | POA: Diagnosis not present

## 2020-09-23 DIAGNOSIS — R1084 Generalized abdominal pain: Secondary | ICD-10-CM | POA: Diagnosis not present

## 2020-09-23 DIAGNOSIS — R112 Nausea with vomiting, unspecified: Secondary | ICD-10-CM | POA: Diagnosis not present

## 2020-09-23 DIAGNOSIS — E860 Dehydration: Secondary | ICD-10-CM | POA: Diagnosis not present

## 2020-09-24 DIAGNOSIS — R1084 Generalized abdominal pain: Secondary | ICD-10-CM | POA: Diagnosis not present

## 2020-09-24 DIAGNOSIS — R112 Nausea with vomiting, unspecified: Secondary | ICD-10-CM | POA: Diagnosis not present

## 2020-09-24 DIAGNOSIS — Z932 Ileostomy status: Secondary | ICD-10-CM | POA: Diagnosis not present

## 2020-09-24 DIAGNOSIS — E860 Dehydration: Secondary | ICD-10-CM | POA: Diagnosis not present

## 2020-09-24 DIAGNOSIS — M6281 Muscle weakness (generalized): Secondary | ICD-10-CM | POA: Diagnosis not present

## 2020-09-25 DIAGNOSIS — E860 Dehydration: Secondary | ICD-10-CM | POA: Diagnosis not present

## 2020-09-25 DIAGNOSIS — R1084 Generalized abdominal pain: Secondary | ICD-10-CM | POA: Diagnosis not present

## 2020-09-25 DIAGNOSIS — Z932 Ileostomy status: Secondary | ICD-10-CM | POA: Diagnosis not present

## 2020-09-25 DIAGNOSIS — R112 Nausea with vomiting, unspecified: Secondary | ICD-10-CM | POA: Diagnosis not present

## 2020-09-26 DIAGNOSIS — M6281 Muscle weakness (generalized): Secondary | ICD-10-CM | POA: Diagnosis not present

## 2020-09-26 DIAGNOSIS — R1084 Generalized abdominal pain: Secondary | ICD-10-CM | POA: Diagnosis not present

## 2020-09-26 DIAGNOSIS — E860 Dehydration: Secondary | ICD-10-CM | POA: Diagnosis not present

## 2020-09-26 DIAGNOSIS — Z932 Ileostomy status: Secondary | ICD-10-CM | POA: Diagnosis not present

## 2020-09-26 DIAGNOSIS — R112 Nausea with vomiting, unspecified: Secondary | ICD-10-CM | POA: Diagnosis not present

## 2020-09-27 DIAGNOSIS — E860 Dehydration: Secondary | ICD-10-CM | POA: Diagnosis not present

## 2020-09-27 DIAGNOSIS — R112 Nausea with vomiting, unspecified: Secondary | ICD-10-CM | POA: Diagnosis not present

## 2020-09-27 DIAGNOSIS — Z932 Ileostomy status: Secondary | ICD-10-CM | POA: Diagnosis not present

## 2020-09-27 DIAGNOSIS — R1084 Generalized abdominal pain: Secondary | ICD-10-CM | POA: Diagnosis not present

## 2020-09-28 DIAGNOSIS — Z932 Ileostomy status: Secondary | ICD-10-CM | POA: Diagnosis not present

## 2020-09-28 DIAGNOSIS — R112 Nausea with vomiting, unspecified: Secondary | ICD-10-CM | POA: Diagnosis not present

## 2020-09-28 DIAGNOSIS — M6281 Muscle weakness (generalized): Secondary | ICD-10-CM | POA: Diagnosis not present

## 2020-09-28 DIAGNOSIS — R1084 Generalized abdominal pain: Secondary | ICD-10-CM | POA: Diagnosis not present

## 2020-09-28 DIAGNOSIS — E860 Dehydration: Secondary | ICD-10-CM | POA: Diagnosis not present

## 2020-09-29 DIAGNOSIS — R112 Nausea with vomiting, unspecified: Secondary | ICD-10-CM | POA: Diagnosis not present

## 2020-09-29 DIAGNOSIS — E860 Dehydration: Secondary | ICD-10-CM | POA: Diagnosis not present

## 2020-09-29 DIAGNOSIS — R1084 Generalized abdominal pain: Secondary | ICD-10-CM | POA: Diagnosis not present

## 2020-09-29 DIAGNOSIS — Z932 Ileostomy status: Secondary | ICD-10-CM | POA: Diagnosis not present

## 2020-09-30 DIAGNOSIS — Z932 Ileostomy status: Secondary | ICD-10-CM | POA: Diagnosis not present

## 2020-09-30 DIAGNOSIS — R1084 Generalized abdominal pain: Secondary | ICD-10-CM | POA: Diagnosis not present

## 2020-09-30 DIAGNOSIS — R112 Nausea with vomiting, unspecified: Secondary | ICD-10-CM | POA: Diagnosis not present

## 2020-09-30 DIAGNOSIS — E860 Dehydration: Secondary | ICD-10-CM | POA: Diagnosis not present

## 2020-10-01 ENCOUNTER — Encounter: Admit: 2020-10-01 | Discharge: 2020-10-01 | Payer: PRIVATE HEALTH INSURANCE | Attending: Family | Primary: Family

## 2020-10-01 DIAGNOSIS — E860 Dehydration: Principal | ICD-10-CM

## 2020-10-01 DIAGNOSIS — R1084 Generalized abdominal pain: Principal | ICD-10-CM

## 2020-10-01 DIAGNOSIS — Z932 Ileostomy status: Principal | ICD-10-CM

## 2020-10-01 DIAGNOSIS — R112 Nausea with vomiting, unspecified: Principal | ICD-10-CM

## 2020-10-01 DIAGNOSIS — M6281 Muscle weakness (generalized): Secondary | ICD-10-CM | POA: Diagnosis not present

## 2020-10-02 ENCOUNTER — Encounter: Admit: 2020-10-02 | Discharge: 2020-10-03 | Payer: PRIVATE HEALTH INSURANCE

## 2020-10-02 ENCOUNTER — Encounter
Admit: 2020-10-02 | Discharge: 2020-10-03 | Payer: PRIVATE HEALTH INSURANCE | Attending: Anesthesiology | Primary: Anesthesiology

## 2020-10-02 DIAGNOSIS — K6389 Other specified diseases of intestine: Secondary | ICD-10-CM | POA: Diagnosis not present

## 2020-10-02 DIAGNOSIS — Z932 Ileostomy status: Secondary | ICD-10-CM | POA: Diagnosis not present

## 2020-10-02 DIAGNOSIS — E860 Dehydration: Secondary | ICD-10-CM | POA: Diagnosis not present

## 2020-10-02 DIAGNOSIS — Z01818 Encounter for other preprocedural examination: Secondary | ICD-10-CM | POA: Diagnosis not present

## 2020-10-02 DIAGNOSIS — R112 Nausea with vomiting, unspecified: Secondary | ICD-10-CM | POA: Diagnosis not present

## 2020-10-02 DIAGNOSIS — R1084 Generalized abdominal pain: Secondary | ICD-10-CM | POA: Diagnosis not present

## 2020-10-02 DIAGNOSIS — D126 Benign neoplasm of colon, unspecified: Secondary | ICD-10-CM | POA: Diagnosis not present

## 2020-10-03 DIAGNOSIS — R112 Nausea with vomiting, unspecified: Secondary | ICD-10-CM | POA: Diagnosis not present

## 2020-10-03 DIAGNOSIS — Z932 Ileostomy status: Secondary | ICD-10-CM | POA: Diagnosis not present

## 2020-10-03 DIAGNOSIS — E860 Dehydration: Secondary | ICD-10-CM | POA: Diagnosis not present

## 2020-10-03 DIAGNOSIS — M6281 Muscle weakness (generalized): Secondary | ICD-10-CM | POA: Diagnosis not present

## 2020-10-03 DIAGNOSIS — R1084 Generalized abdominal pain: Secondary | ICD-10-CM | POA: Diagnosis not present

## 2020-10-04 DIAGNOSIS — R1084 Generalized abdominal pain: Secondary | ICD-10-CM | POA: Diagnosis not present

## 2020-10-04 DIAGNOSIS — Z932 Ileostomy status: Secondary | ICD-10-CM | POA: Diagnosis not present

## 2020-10-04 DIAGNOSIS — E860 Dehydration: Secondary | ICD-10-CM | POA: Diagnosis not present

## 2020-10-04 DIAGNOSIS — M6281 Muscle weakness (generalized): Secondary | ICD-10-CM | POA: Diagnosis not present

## 2020-10-04 DIAGNOSIS — R112 Nausea with vomiting, unspecified: Secondary | ICD-10-CM | POA: Diagnosis not present

## 2020-10-05 DIAGNOSIS — R112 Nausea with vomiting, unspecified: Secondary | ICD-10-CM | POA: Diagnosis not present

## 2020-10-05 DIAGNOSIS — R1084 Generalized abdominal pain: Secondary | ICD-10-CM | POA: Diagnosis not present

## 2020-10-05 DIAGNOSIS — E860 Dehydration: Secondary | ICD-10-CM | POA: Diagnosis not present

## 2020-10-05 DIAGNOSIS — Z932 Ileostomy status: Secondary | ICD-10-CM | POA: Diagnosis not present

## 2020-10-06 DIAGNOSIS — Z932 Ileostomy status: Secondary | ICD-10-CM | POA: Diagnosis not present

## 2020-10-06 DIAGNOSIS — E860 Dehydration: Secondary | ICD-10-CM | POA: Diagnosis not present

## 2020-10-06 DIAGNOSIS — R1084 Generalized abdominal pain: Secondary | ICD-10-CM | POA: Diagnosis not present

## 2020-10-06 DIAGNOSIS — R112 Nausea with vomiting, unspecified: Secondary | ICD-10-CM | POA: Diagnosis not present

## 2020-10-07 DIAGNOSIS — R1084 Generalized abdominal pain: Secondary | ICD-10-CM | POA: Diagnosis not present

## 2020-10-07 DIAGNOSIS — Z932 Ileostomy status: Secondary | ICD-10-CM | POA: Diagnosis not present

## 2020-10-07 DIAGNOSIS — E860 Dehydration: Secondary | ICD-10-CM | POA: Diagnosis not present

## 2020-10-07 DIAGNOSIS — R112 Nausea with vomiting, unspecified: Secondary | ICD-10-CM | POA: Diagnosis not present

## 2020-10-08 ENCOUNTER — Encounter: Admit: 2020-10-08 | Discharge: 2020-10-08 | Payer: PRIVATE HEALTH INSURANCE | Attending: Family | Primary: Family

## 2020-10-08 DIAGNOSIS — R1084 Generalized abdominal pain: Principal | ICD-10-CM

## 2020-10-08 DIAGNOSIS — Z932 Ileostomy status: Principal | ICD-10-CM

## 2020-10-08 DIAGNOSIS — E860 Dehydration: Principal | ICD-10-CM

## 2020-10-08 DIAGNOSIS — R112 Nausea with vomiting, unspecified: Principal | ICD-10-CM

## 2020-10-08 DIAGNOSIS — M6281 Muscle weakness (generalized): Secondary | ICD-10-CM | POA: Diagnosis not present

## 2020-10-09 ENCOUNTER — Ambulatory Visit
Admit: 2020-10-09 | Discharge: 2020-10-14 | Disposition: A | Discharge status: Home / Self Care | Payer: PRIVATE HEALTH INSURANCE | Admitting: Surgery

## 2020-10-09 ENCOUNTER — Encounter
Admit: 2020-10-09 | Discharge: 2020-10-14 | Disposition: A | Discharge status: Home / Self Care | Payer: PRIVATE HEALTH INSURANCE | Attending: Anesthesiology | Admitting: Surgery

## 2020-10-09 ENCOUNTER — Encounter
Admit: 2020-10-09 | Discharge: 2020-10-14 | Disposition: A | Discharge status: Home / Self Care | Payer: PRIVATE HEALTH INSURANCE | Attending: Certified Registered" | Admitting: Surgery

## 2020-10-09 DIAGNOSIS — Z9049 Acquired absence of other specified parts of digestive tract: Secondary | ICD-10-CM | POA: Diagnosis not present

## 2020-10-09 DIAGNOSIS — D126 Benign neoplasm of colon, unspecified: Secondary | ICD-10-CM | POA: Diagnosis not present

## 2020-10-09 DIAGNOSIS — R112 Nausea with vomiting, unspecified: Secondary | ICD-10-CM | POA: Diagnosis not present

## 2020-10-09 DIAGNOSIS — Z86010 Personal history of colonic polyps: Secondary | ICD-10-CM | POA: Diagnosis not present

## 2020-10-09 DIAGNOSIS — K660 Peritoneal adhesions (postprocedural) (postinfection): Secondary | ICD-10-CM | POA: Diagnosis not present

## 2020-10-09 DIAGNOSIS — Z452 Encounter for adjustment and management of vascular access device: Secondary | ICD-10-CM | POA: Diagnosis not present

## 2020-10-09 DIAGNOSIS — E860 Dehydration: Secondary | ICD-10-CM | POA: Diagnosis not present

## 2020-10-09 DIAGNOSIS — K219 Gastro-esophageal reflux disease without esophagitis: Secondary | ICD-10-CM | POA: Diagnosis not present

## 2020-10-09 DIAGNOSIS — Z980 Intestinal bypass and anastomosis status: Secondary | ICD-10-CM | POA: Diagnosis not present

## 2020-10-09 DIAGNOSIS — Z432 Encounter for attention to ileostomy: Secondary | ICD-10-CM | POA: Diagnosis not present

## 2020-10-09 DIAGNOSIS — Z9221 Personal history of antineoplastic chemotherapy: Secondary | ICD-10-CM | POA: Diagnosis not present

## 2020-10-09 DIAGNOSIS — D692 Other nonthrombocytopenic purpura: Secondary | ICD-10-CM | POA: Diagnosis not present

## 2020-10-09 DIAGNOSIS — Z932 Ileostomy status: Secondary | ICD-10-CM | POA: Diagnosis not present

## 2020-10-09 DIAGNOSIS — F419 Anxiety disorder, unspecified: Secondary | ICD-10-CM | POA: Diagnosis not present

## 2020-10-09 DIAGNOSIS — R1084 Generalized abdominal pain: Secondary | ICD-10-CM | POA: Diagnosis not present

## 2020-10-09 DIAGNOSIS — Z79899 Other long term (current) drug therapy: Secondary | ICD-10-CM | POA: Diagnosis not present

## 2020-10-10 DIAGNOSIS — R1084 Generalized abdominal pain: Secondary | ICD-10-CM | POA: Diagnosis not present

## 2020-10-10 DIAGNOSIS — R112 Nausea with vomiting, unspecified: Secondary | ICD-10-CM | POA: Diagnosis not present

## 2020-10-10 DIAGNOSIS — E860 Dehydration: Secondary | ICD-10-CM | POA: Diagnosis not present

## 2020-10-10 DIAGNOSIS — Z932 Ileostomy status: Secondary | ICD-10-CM | POA: Diagnosis not present

## 2020-10-11 DIAGNOSIS — E860 Dehydration: Secondary | ICD-10-CM | POA: Diagnosis not present

## 2020-10-11 DIAGNOSIS — R112 Nausea with vomiting, unspecified: Secondary | ICD-10-CM | POA: Diagnosis not present

## 2020-10-11 DIAGNOSIS — Z932 Ileostomy status: Secondary | ICD-10-CM | POA: Diagnosis not present

## 2020-10-11 DIAGNOSIS — R1084 Generalized abdominal pain: Secondary | ICD-10-CM | POA: Diagnosis not present

## 2020-10-12 DIAGNOSIS — E860 Dehydration: Secondary | ICD-10-CM | POA: Diagnosis not present

## 2020-10-12 DIAGNOSIS — R1084 Generalized abdominal pain: Secondary | ICD-10-CM | POA: Diagnosis not present

## 2020-10-12 DIAGNOSIS — Z932 Ileostomy status: Secondary | ICD-10-CM | POA: Diagnosis not present

## 2020-10-12 DIAGNOSIS — R112 Nausea with vomiting, unspecified: Secondary | ICD-10-CM | POA: Diagnosis not present

## 2020-10-14 MED ORDER — ONDANSETRON 4 MG DISINTEGRATING TABLET
ORAL_TABLET | Freq: Four times a day (QID) | ORAL | 0 refills | 3.00000 days | Status: CP | PRN
Start: 2020-10-14 — End: 2020-10-21
  Filled 2020-10-14: qty 10, 3d supply, fill #0

## 2020-10-14 MED ORDER — OXYCODONE 10 MG TABLET
ORAL_TABLET | Freq: Four times a day (QID) | ORAL | 0 refills | 4 days | Status: CP | PRN
Start: 2020-10-14 — End: 2020-10-19
  Filled 2020-10-14: qty 15, 4d supply, fill #0

## 2020-10-14 MED ORDER — MENTHOL 0.44 %-ZINC OXIDE 20.6 % TOPICAL OINTMENT
Freq: Four times a day (QID) | TOPICAL | 11 refills | 0.00000 days | Status: CP | PRN
Start: 2020-10-14 — End: ?
  Filled 2020-10-14: qty 113, 30d supply, fill #0

## 2020-10-14 MED ORDER — METHOCARBAMOL 500 MG TABLET
ORAL_TABLET | Freq: Three times a day (TID) | ORAL | 0 refills | 5 days | Status: CP | PRN
Start: 2020-10-14 — End: ?
  Filled 2020-10-14: qty 30, 5d supply, fill #0

## 2020-10-15 ENCOUNTER — Telehealth: Payer: Self-pay

## 2020-10-15 NOTE — Telephone Encounter (Signed)
Spoke to [place] from [facility], told her we are not providing care for this pt at this time looks like she saw General surgeon Donia Ast who is providing care. Caryl Pina verbalized understanding and will contact him.

## 2020-10-15 NOTE — Telephone Encounter (Signed)
Lisa Donovan from [facility] called that the was in the hospital. The pt was discharged without orders. Lisa Donovan stated that she needs approval from [name] to send out education for her picc line. Lisa Donovan stated that someone can call her at [phone].

## 2020-10-16 DIAGNOSIS — R1084 Generalized abdominal pain: Secondary | ICD-10-CM | POA: Diagnosis not present

## 2020-10-16 DIAGNOSIS — E860 Dehydration: Secondary | ICD-10-CM | POA: Diagnosis not present

## 2020-10-16 DIAGNOSIS — R112 Nausea with vomiting, unspecified: Secondary | ICD-10-CM | POA: Diagnosis not present

## 2020-10-16 DIAGNOSIS — Z932 Ileostomy status: Secondary | ICD-10-CM | POA: Diagnosis not present

## 2020-10-17 DIAGNOSIS — R1084 Generalized abdominal pain: Secondary | ICD-10-CM | POA: Diagnosis not present

## 2020-10-18 MED ORDER — METHOCARBAMOL 500 MG TABLET
ORAL_TABLET | Freq: Three times a day (TID) | ORAL | 0 refills | 5 days | Status: CP | PRN
Start: 2020-10-18 — End: ?

## 2020-10-18 MED ORDER — OXYCODONE 10 MG TABLET
ORAL_TABLET | Freq: Three times a day (TID) | ORAL | 0 refills | 5 days | Status: CP | PRN
Start: 2020-10-18 — End: ?

## 2020-10-19 ENCOUNTER — Telehealth: Payer: Self-pay

## 2020-10-19 DIAGNOSIS — Z111 Encounter for screening for respiratory tuberculosis: Secondary | ICD-10-CM

## 2020-10-19 NOTE — Telephone Encounter (Signed)
Pt is scheduled °

## 2020-10-19 NOTE — Telephone Encounter (Signed)
Pt's mom called regarding a TB blood test. Mom stated that she is starting nursing school and needs a test done before she goes. Can we order the blood test?

## 2020-10-19 NOTE — Telephone Encounter (Signed)
Please call pt and schedule lab appt only. Orders have been placed.

## 2020-10-22 ENCOUNTER — Other Ambulatory Visit: Payer: Self-pay

## 2020-10-22 ENCOUNTER — Other Ambulatory Visit (INDEPENDENT_AMBULATORY_CARE_PROVIDER_SITE_OTHER): Payer: BC Managed Care – PPO

## 2020-10-22 DIAGNOSIS — Z111 Encounter for screening for respiratory tuberculosis: Secondary | ICD-10-CM

## 2020-10-23 DIAGNOSIS — M6281 Muscle weakness (generalized): Secondary | ICD-10-CM | POA: Diagnosis not present

## 2020-10-24 ENCOUNTER — Ambulatory Visit: Admit: 2020-10-24 | Discharge: 2020-10-25 | Payer: PRIVATE HEALTH INSURANCE | Attending: Surgery | Primary: Surgery

## 2020-10-24 DIAGNOSIS — Z09 Encounter for follow-up examination after completed treatment for conditions other than malignant neoplasm: Principal | ICD-10-CM

## 2020-10-24 DIAGNOSIS — D126 Benign neoplasm of colon, unspecified: Principal | ICD-10-CM

## 2020-10-24 DIAGNOSIS — R1084 Generalized abdominal pain: Secondary | ICD-10-CM | POA: Diagnosis not present

## 2020-10-25 DIAGNOSIS — M6281 Muscle weakness (generalized): Secondary | ICD-10-CM | POA: Diagnosis not present

## 2020-10-25 LAB — QUANTIFERON-TB GOLD PLUS
Mitogen-NIL: >10 IU/mL
NIL: 0.14 IU/mL
QuantiFERON-TB Gold Plus: NEGATIVE
TB1-NIL: <0 IU/mL
TB2-NIL: <0 IU/mL

## 2020-10-29 DIAGNOSIS — M6281 Muscle weakness (generalized): Secondary | ICD-10-CM | POA: Diagnosis not present

## 2020-10-30 ENCOUNTER — Ambulatory Visit: Admit: 2020-10-30 | Discharge: 2020-10-31 | Payer: PRIVATE HEALTH INSURANCE | Attending: Family | Primary: Family

## 2020-10-30 DIAGNOSIS — Z452 Encounter for adjustment and management of vascular access device: Secondary | ICD-10-CM | POA: Diagnosis not present

## 2020-10-30 MED ORDER — OXYCODONE 10 MG TABLET
ORAL_TABLET | Freq: Every evening | ORAL | 0 refills | 10 days | Status: CP | PRN
Start: 2020-10-30 — End: ?

## 2020-10-30 MED ORDER — GABAPENTIN 100 MG CAPSULE
ORAL_CAPSULE | Freq: Three times a day (TID) | ORAL | 0 refills | 15 days | Status: CP
Start: 2020-10-30 — End: 2020-11-14

## 2020-11-05 DIAGNOSIS — M6281 Muscle weakness (generalized): Secondary | ICD-10-CM | POA: Diagnosis not present

## 2020-11-06 ENCOUNTER — Telehealth: Admit: 2020-11-06 | Discharge: 2020-11-07 | Payer: PRIVATE HEALTH INSURANCE

## 2020-11-06 DIAGNOSIS — D481 Neoplasm of uncertain behavior of connective and other soft tissue: Principal | ICD-10-CM

## 2020-11-06 DIAGNOSIS — G8928 Other chronic postprocedural pain: Principal | ICD-10-CM

## 2020-11-06 DIAGNOSIS — Z515 Encounter for palliative care: Principal | ICD-10-CM

## 2020-11-08 DIAGNOSIS — M6281 Muscle weakness (generalized): Secondary | ICD-10-CM | POA: Diagnosis not present

## 2020-11-09 ENCOUNTER — Ambulatory Visit: Admit: 2020-11-09 | Discharge: 2020-11-10 | Payer: PRIVATE HEALTH INSURANCE

## 2020-11-09 DIAGNOSIS — Q8789 Other specified congenital malformation syndromes, not elsewhere classified: Secondary | ICD-10-CM | POA: Diagnosis not present

## 2020-11-09 DIAGNOSIS — R918 Other nonspecific abnormal finding of lung field: Secondary | ICD-10-CM | POA: Diagnosis not present

## 2020-11-09 DIAGNOSIS — D500 Iron deficiency anemia secondary to blood loss (chronic): Secondary | ICD-10-CM | POA: Diagnosis not present

## 2020-11-09 DIAGNOSIS — D481 Neoplasm of uncertain behavior of connective and other soft tissue: Secondary | ICD-10-CM | POA: Diagnosis not present

## 2020-11-13 ENCOUNTER — Encounter: Admit: 2020-11-13 | Discharge: 2020-11-14 | Payer: PRIVATE HEALTH INSURANCE

## 2020-11-13 DIAGNOSIS — G8928 Other chronic postprocedural pain: Principal | ICD-10-CM

## 2020-11-13 DIAGNOSIS — Z515 Encounter for palliative care: Principal | ICD-10-CM

## 2020-11-13 DIAGNOSIS — D481 Neoplasm of uncertain behavior of connective and other soft tissue: Principal | ICD-10-CM

## 2020-11-19 DIAGNOSIS — M6281 Muscle weakness (generalized): Secondary | ICD-10-CM | POA: Diagnosis not present

## 2020-11-20 ENCOUNTER — Ambulatory Visit: Admit: 2020-11-20 | Discharge: 2020-11-20 | Payer: PRIVATE HEALTH INSURANCE

## 2020-11-20 ENCOUNTER — Other Ambulatory Visit: Admit: 2020-11-20 | Discharge: 2020-11-20 | Payer: PRIVATE HEALTH INSURANCE

## 2020-11-20 DIAGNOSIS — R5383 Other fatigue: Principal | ICD-10-CM

## 2020-11-20 DIAGNOSIS — D481 Neoplasm of uncertain behavior of connective and other soft tissue: Principal | ICD-10-CM

## 2020-11-20 DIAGNOSIS — R52 Pain, unspecified: Principal | ICD-10-CM

## 2020-11-20 DIAGNOSIS — Z515 Encounter for palliative care: Principal | ICD-10-CM

## 2020-11-20 DIAGNOSIS — R1030 Lower abdominal pain, unspecified: Principal | ICD-10-CM

## 2020-11-20 DIAGNOSIS — Q8789 Other specified congenital malformation syndromes, not elsewhere classified: Principal | ICD-10-CM

## 2020-11-20 DIAGNOSIS — D500 Iron deficiency anemia secondary to blood loss (chronic): Principal | ICD-10-CM

## 2020-11-20 DIAGNOSIS — D509 Iron deficiency anemia, unspecified: Principal | ICD-10-CM

## 2020-11-20 DIAGNOSIS — Z885 Allergy status to narcotic agent status: Secondary | ICD-10-CM | POA: Diagnosis not present

## 2020-11-20 DIAGNOSIS — K625 Hemorrhage of anus and rectum: Secondary | ICD-10-CM | POA: Diagnosis not present

## 2020-11-20 DIAGNOSIS — R1900 Intra-abdominal and pelvic swelling, mass and lump, unspecified site: Secondary | ICD-10-CM | POA: Diagnosis not present

## 2020-11-20 DIAGNOSIS — G8918 Other acute postprocedural pain: Secondary | ICD-10-CM | POA: Diagnosis not present

## 2020-11-20 DIAGNOSIS — K7689 Other specified diseases of liver: Secondary | ICD-10-CM | POA: Diagnosis not present

## 2020-11-20 DIAGNOSIS — F4310 Post-traumatic stress disorder, unspecified: Secondary | ICD-10-CM | POA: Diagnosis not present

## 2020-11-20 DIAGNOSIS — R0781 Pleurodynia: Secondary | ICD-10-CM | POA: Diagnosis not present

## 2020-11-20 DIAGNOSIS — R110 Nausea: Secondary | ICD-10-CM | POA: Diagnosis not present

## 2020-11-20 DIAGNOSIS — R21 Rash and other nonspecific skin eruption: Secondary | ICD-10-CM | POA: Diagnosis not present

## 2020-11-20 DIAGNOSIS — R159 Full incontinence of feces: Secondary | ICD-10-CM | POA: Diagnosis not present

## 2020-11-20 DIAGNOSIS — R309 Painful micturition, unspecified: Secondary | ICD-10-CM | POA: Diagnosis not present

## 2020-11-20 DIAGNOSIS — F419 Anxiety disorder, unspecified: Secondary | ICD-10-CM | POA: Diagnosis not present

## 2020-11-20 DIAGNOSIS — Z8616 Personal history of COVID-19: Secondary | ICD-10-CM | POA: Diagnosis not present

## 2020-11-20 DIAGNOSIS — N281 Cyst of kidney, acquired: Secondary | ICD-10-CM | POA: Diagnosis not present

## 2020-11-21 ENCOUNTER — Ambulatory Visit: Admit: 2020-11-21 | Discharge: 2020-11-22 | Payer: PRIVATE HEALTH INSURANCE | Attending: Surgery | Primary: Surgery

## 2020-11-21 DIAGNOSIS — K6289 Other specified diseases of anus and rectum: Principal | ICD-10-CM

## 2020-11-21 DIAGNOSIS — K624 Stenosis of anus and rectum: Secondary | ICD-10-CM | POA: Diagnosis not present

## 2020-11-22 DIAGNOSIS — M6281 Muscle weakness (generalized): Secondary | ICD-10-CM | POA: Diagnosis not present

## 2020-11-28 ENCOUNTER — Ambulatory Visit: Admit: 2020-11-28 | Discharge: 2020-11-29 | Payer: PRIVATE HEALTH INSURANCE

## 2020-11-28 DIAGNOSIS — D481 Neoplasm of uncertain behavior of connective and other soft tissue: Principal | ICD-10-CM

## 2020-11-28 DIAGNOSIS — Q8789 Other specified congenital malformation syndromes, not elsewhere classified: Principal | ICD-10-CM

## 2020-11-29 DIAGNOSIS — D235 Other benign neoplasm of skin of trunk: Principal | ICD-10-CM

## 2020-11-29 DIAGNOSIS — Q8789 Other specified congenital malformation syndromes, not elsewhere classified: Principal | ICD-10-CM

## 2020-11-29 DIAGNOSIS — D481 Neoplasm of uncertain behavior of connective and other soft tissue: Principal | ICD-10-CM

## 2020-11-29 DIAGNOSIS — M6281 Muscle weakness (generalized): Secondary | ICD-10-CM | POA: Diagnosis not present

## 2020-12-03 DIAGNOSIS — M6281 Muscle weakness (generalized): Secondary | ICD-10-CM | POA: Diagnosis not present

## 2020-12-05 ENCOUNTER — Telehealth: Payer: Self-pay

## 2020-12-05 NOTE — Telephone Encounter (Signed)
Pt is scheduled °

## 2020-12-05 NOTE — Telephone Encounter (Signed)
Patient needs to schedule an appointment to have labs ordered. Has not been seen in almost a year.

## 2020-12-05 NOTE — Telephone Encounter (Signed)
Mom called wanting to have a full blood panel done on Lisa Donovan. She stated that Gianni had an appt in [place] but they didn't add on labs. Mom wants to know if we can do them here. Please Advise.

## 2020-12-05 NOTE — Telephone Encounter (Signed)
Noted  

## 2020-12-06 DIAGNOSIS — M6281 Muscle weakness (generalized): Secondary | ICD-10-CM | POA: Diagnosis not present

## 2020-12-07 ENCOUNTER — Ambulatory Visit: Payer: BC Managed Care – PPO | Admitting: Physician Assistant

## 2020-12-11 ENCOUNTER — Ambulatory Visit
Admit: 2020-12-11 | Discharge: 2020-12-12 | Payer: PRIVATE HEALTH INSURANCE | Attending: Plastic and Reconstructive Surgery | Primary: Plastic and Reconstructive Surgery

## 2020-12-11 DIAGNOSIS — D481 Neoplasm of uncertain behavior of connective and other soft tissue: Principal | ICD-10-CM

## 2020-12-11 DIAGNOSIS — Q8789 Other specified congenital malformation syndromes, not elsewhere classified: Principal | ICD-10-CM

## 2020-12-11 DIAGNOSIS — D235 Other benign neoplasm of skin of trunk: Principal | ICD-10-CM

## 2020-12-11 DIAGNOSIS — D485 Neoplasm of uncertain behavior of skin: Principal | ICD-10-CM

## 2020-12-11 NOTE — Progress Notes (Signed)
Subjective:    Lisa Donovan is a 20 y.o. female and is here for a comprehensive physical exam.  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  Patient had colectomy over the summer which ended up being quite complicated requiring prolonged hospitalization and multiple ER visits.  She continues to try to work on strengthening and healing with multiple specialists.  Health Maintenance: Immunizations --flu shot today PAP --establish with GYN, will schedule Bone Density -- n/a Diet --working on increasing calories as able due to very limited diet and tolerance Sleep habits --difficult at times due to medical issues and anxiety Exercise --limited Current Weight -- Weight: 117 lb (53.1 kg)  Weight History: Wt Readings from Last 10 Encounters:  12/12/20 117 lb (53.1 kg)  09/08/20 107 lb (48.5 kg)  11/09/19 115 lb (52.2 kg) (24 %, Z= -0.71)*  07/25/19 112 lb 8 oz (51 kg) (20 %, Z= -0.85)*  07/14/12 83 lb (37.6 kg) (19 %, Z= -0.87)*   * Growth percentiles are based on CDC (Girls, 2-20 Years) data.   Body mass index is 19.47 kg/m. Mood --situational anxiety regarding her health conditions  Patient's last menstrual period was 11/12/2020 (approximate).    reports no history of alcohol use.  Tobacco Use: Low Risk    Smoking Tobacco Use: Never   Smokeless Tobacco Use: Never     Depression screen PHQ 2/9 12/12/2020  Decreased Interest 0  Down, Depressed, Hopeless 0  PHQ - 2 Score 0     Other providers/specialists: Patient Care Team: Inda Coke, [place] as PCP - General (Physician Assistant)   PMHx, SurgHx, SocialHx, Medications, and Allergies were reviewed in the Visit Navigator  and updated as appropriate.   Past Medical History:  Diagnosis Date   Anemia    Colon polyps    Dx at age 41, has colonoscopy yearly   [name]    [name]    Dx at age 55   GERD (gastroesophageal reflux disease)    H/O colonoscopy    Pt has has them yearly   History of endoscopy      Past Surgical History:  Procedure Laterality Date   COLOPROCTECTOMY W/ ILEO J POUCH     Oral surgery     Tumor removed     Numerous surgies since she was 81 months old    History reviewed. No pertinent family history.  Social History   Tobacco Use   Smoking status: Never   Smokeless tobacco: Never  Vaping Use   Vaping Use: Never used  Substance Use Topics   Alcohol use: Never   Drug use: Never    Review of Systems:   Review of Systems  Constitutional:  Negative for chills, fever and weight loss.  HENT:  Negative for hearing loss, sinus pain and sore throat.   Respiratory:  Negative for cough and hemoptysis.   Cardiovascular:  Negative for chest pain, palpitations, leg swelling and PND.  Gastrointestinal:  Negative for abdominal pain, constipation, diarrhea, heartburn, nausea and vomiting.  Genitourinary:  Negative for dysuria, frequency and urgency.  Musculoskeletal:  Negative for back pain, myalgias and neck pain.  Skin:  Negative for itching and rash.  Neurological:  Negative for dizziness, tingling, seizures and headaches.  Endo/Heme/Allergies:  Negative for polydipsia.  Psychiatric/Behavioral:  Negative for depression. The patient is not nervous/anxious.    Objective:   BP 122/74   Pulse 80   Temp 98.3 F (36.8 C) (Temporal)   Wt 117 lb (53.1 kg)   LMP 11/12/2020 (Approximate)   SpO2 99%   BMI 19.47 kg/m   General Appearance:    Alert, cooperative, no distress, appears stated age  Head:    Normocephalic, without obvious abnormality, atraumatic  Eyes:    PERRL, conjunctiva/corneas clear, EOM's intact, fundi    benign, both eyes  Ears:    Normal TM's and  external ear canals, both ears  Nose:   Nares normal, septum midline, mucosa normal, no drainage    or sinus tenderness  Throat:   Lips, mucosa, and tongue normal; teeth and gums normal  Neck:   Supple, symmetrical, trachea midline, no adenopathy;    thyroid:  no enlargement/tenderness/nodules; no carotid   bruit or JVD  Back:     Symmetric, no curvature, ROM normal, no CVA tenderness  Lungs:     Clear to auscultation bilaterally, respirations unlabored  Chest Wall:    No tenderness or deformity   Heart:    Regular rate and rhythm, S1 and S2 normal, no murmur, rub   or gallop  Breast Exam:  Deferred  Abdomen:     Soft, non-tender, bowel sounds active all four quadrants,    no masses, no organomegaly  Genitalia:  Deferred  Rectal:  Deferred  Extremities:   Extremities normal, atraumatic, no cyanosis or edema  Pulses:   2+ and symmetric all extremities  Skin:   Skin color, texture, turgor normal, no rashes or lesions  Lymph nodes:   Cervical, supraclavicular, and axillary nodes normal  Neurologic:   CNII-XII intact, normal strength, sensation and reflexes    throughout    Assessment/Plan:   Routine physical examination Today patient counseled on age appropriate routine health concerns for screening and prevention, each reviewed and up to date or declined. Immunizations reviewed and up to date or declined. Labs ordered and reviewed. Risk factors for depression reviewed and negative. Hearing function and visual acuity are intact. ADLs screened and addressed as needed. Functional ability and level of safety reviewed and appropriate. Education, counseling and referrals performed based on assessed risks today. Patient provided with a copy of personalized plan for preventive services.  We did not get blood work today, we did review recent blood work done by specialist that are available in epic.  Gardner's syndrome Management per multiple specialists Continue to provide ongoing emotional  support as able Refill Zofran for as needed use     Patient Counseling:   [x]     Nutrition: Stressed importance of moderation in sodium/caffeine intake, saturated fat and cholesterol, caloric balance, sufficient intake of fresh fruits, vegetables, fiber, calcium, iron, and 1 mg of folate supplement per day (for females capable of pregnancy).   [x]      Stressed the importance of regular exercise.    [x]     Substance Abuse: Discussed cessation/primary prevention of tobacco, alcohol, or other drug use; driving or other dangerous activities under the influence; availability of treatment for abuse.    [x]      Injury prevention: Discussed safety belts, safety helmets, smoke detector, smoking near bedding or upholstery.    [x]      Sexuality: Discussed sexually transmitted diseases, partner selection, use of condoms, avoidance of unintended pregnancy  and contraceptive alternatives.    [x]     Dental health: Discussed importance of regular tooth brushing, flossing, and dental visits.   [x]      Health maintenance and immunizations reviewed. Please refer to Health maintenance section.   Faythe Dingwall, PA, have reviewed all documentation for this visit. The documentation on 12/12/20 for the exam, diagnosis, procedures, and orders are all accurate and complete.   Inda Coke, PA-C [place]

## 2020-12-12 ENCOUNTER — Encounter: Payer: Self-pay | Admitting: Physician Assistant

## 2020-12-12 ENCOUNTER — Other Ambulatory Visit: Payer: Self-pay

## 2020-12-12 ENCOUNTER — Ambulatory Visit (INDEPENDENT_AMBULATORY_CARE_PROVIDER_SITE_OTHER): Payer: BC Managed Care – PPO | Admitting: Physician Assistant

## 2020-12-12 VITALS — BP 122/74 | HR 80 | Temp 98.3°F | Wt 117.0 lb

## 2020-12-12 DIAGNOSIS — Q8789 Other specified congenital malformation syndromes, not elsewhere classified: Secondary | ICD-10-CM | POA: Diagnosis not present

## 2020-12-12 DIAGNOSIS — Z0000 Encounter for general adult medical examination without abnormal findings: Secondary | ICD-10-CM

## 2020-12-12 MED ORDER — ONDANSETRON 4 MG PO TBDP: 4.0000 mg | ORAL_TABLET | Freq: Three times a day (TID) | ORAL | 1 refills | Status: DC | PRN

## 2020-12-12 NOTE — Patient Instructions (Addendum)
It was great to see you!  Take care,  Lisa Donovan

## 2020-12-12 NOTE — Progress Notes (Deleted)
Subjective:    Lisa Donovan is a 20 y.o. female and is here for a comprehensive physical exam.  HPI  Health Maintenance Due  Topic Date Due   HIV Screening  Never done   Hepatitis C Screening  Never done   INFLUENZA VACCINE  10/08/2020   PAP-Cervical Cytology Screening  11/29/2020   PAP SMEAR-Modifier  11/29/2020   Chief Complaint  Patient presents with   Annual Exam    Full colectomy over the summer - still experiencing bleeding with bowel movements and abdominal pain   Health Maintenance    Flu shot given today    Acute Concerns:   Chronic Issues:  Post Colectomy   Today [name] presents with her mother today to discuss iron levels and have regular labs done. Lisa Donovan has had a hx of colon polyps as well as iron deficiency. This past summer she had underwent  laparoscopic proctocolectomy, ileal j pouch, with ostomy and ileostomy takedown. She had a number of complications from the surgery including continual rectal bleeding, bowel obstruction and trouble eating or drinking anything.  She is also still experiencing pain, sometimes being so bad that she has to take her oxycodone.   Iron Deficiency  Lisa Donovan and her mother both expressed concern for her constant bleeding post op with her surgeon, Dr. Audie Clear, and gastroenterologist, Dr. Jimmye Norman. Upon reaching out to them she states they didn't express much worry due to her age. Her iron level has been extremely low due to rectal bleeding and she believes she may need another iron infusion. She has experienced so much trauma since the surgery that she doesn't feel as though it is normal and wants to explore all possible solutions.   Depression/Anxiety Due to her illness, she has had to stop attending nursing school which made her upset. It has been hard for her since she feels as though her life has been put on pause for something she can't control. Currently she doesn't have many friends around due to them being in  school. She does have a support system, that being her immediate family.   Health Maintenance: Immunizations -- Influenza Vaccine- Last completed 12/12/20.  COVID- Last completed 12/01/19 Therapist, music) Tdap- Last completed 10/11/18.  PAP -- Never done Bone Density -- N/A Diet -- Finding it hard to find foods that won't upset her stomach. Trying to eat a lot of iron filled foods.  Sleep habits -- Occasionally needs to take medication to rest easy.  Ophthalmology- Sees one regularly. Has a cataract in one eye. Dentistry- Sees one regularly.  Exercise -- Not currently exercising.  Current Weight -- Losing weight, only gained due to TPN. Weight History: Wt Readings from Last 10 Encounters:  12/12/20 117 lb (53.1 kg)  09/08/20 107 lb (48.5 kg)  11/09/19 115 lb (52.2 kg) (24 %, Z= -0.71)*  07/25/19 112 lb 8 oz (51 kg) (20 %, Z= -0.85)*  07/14/12 83 lb (37.6 kg) (19 %, Z= -0.87)*   * Growth percentiles are based on CDC (Girls, 2-20 Years) data.   Body mass index is 19.47 kg/m. Mood -- Not currently stable due to health problems.   Patient's last menstrual period was 11/12/2020 (approximate). Period characteristics -- Last cycle was extremely painful. Rectal bleeding was present at the same time. She says that she couldn't move and had to take her oxycodone.      reports no history of alcohol use.  Tobacco Use: Low Risk    Smoking Tobacco Use:  Never   Smokeless Tobacco Use: Never     Depression screen PHQ 2/9 12/12/2020  Decreased Interest 0  Down, Depressed, Hopeless 0  PHQ - 2 Score 0     Other providers/specialists: Patient Care Team: Inda Coke, [place] as PCP - General (Physician Assistant)   PMHx, SurgHx, SocialHx, Medications, and Allergies were reviewed in the Visit Navigator and updated as appropriate.   Past Medical History:  Diagnosis Date   Anemia    Colon polyps    Dx at age 89, has colonoscopy yearly   [name]    [name]    Dx at age 33    GERD (gastroesophageal reflux disease)    H/O colonoscopy    Pt has has them yearly   History of endoscopy      Past Surgical History:  Procedure Laterality Date   COLOPROCTECTOMY W/ ILEO J POUCH     Oral surgery     Tumor removed     Numerous surgies since she was 25 months old    History reviewed. No pertinent family history.  Social History   Tobacco Use   Smoking status: Never   Smokeless tobacco: Never  Vaping Use   Vaping Use: Never used  Substance Use Topics   Alcohol use: Never   Drug use: Never    Review of Systems:   ROS Negative unless otherwise specified per HPI.  Objective:   BP 122/74   Pulse 80   Temp 98.3 F (36.8 C) (Temporal)   Wt 117 lb (53.1 kg)   LMP 11/12/2020 (Approximate)   SpO2 99%   BMI 19.47 kg/m   General Appearance:    Alert, cooperative, no distress, appears stated age  Head:    Normocephalic, without obvious abnormality, atraumatic  Eyes:    PERRL, conjunctiva/corneas clear, EOM's intact, fundi    benign, both eyes  Ears:    Normal TM's and external ear canals, both ears  Nose:   Nares normal, septum midline, mucosa normal, no drainage    or sinus tenderness  Throat:   Lips, mucosa, and tongue normal; teeth and gums normal  Neck:   Supple, symmetrical, trachea midline, no adenopathy;    thyroid:  no enlargement/tenderness/nodules; no carotid   bruit or JVD  Back:     Symmetric, no curvature, ROM normal, no CVA tenderness  Lungs:     Clear to auscultation bilaterally, respirations unlabored  Chest Wall:    No tenderness or deformity   Heart:    Regular rate and rhythm, S1 and S2 normal, no murmur, rub   or gallop  Breast Exam:    No tenderness, masses, or nipple abnormality  Abdomen:     Soft, non-tender, bowel sounds active all four quadrants,    no masses, no organomegaly  Genitalia:    Normal female without lesion, discharge or tenderness  Rectal:    Normal tone no masses or tenderness  Extremities:   Extremities normal,  atraumatic, no cyanosis or edema  Pulses:   2+ and symmetric all extremities  Skin:   Skin color, texture, turgor normal, no rashes or lesions  Lymph nodes:   Cervical, supraclavicular, and axillary nodes normal  Neurologic:   CNII-XII intact, normal strength, sensation and reflexes    throughout    Assessment/Plan:       Patient Counseling:   [x]     Nutrition: Stressed importance of moderation in sodium/caffeine intake, saturated fat and cholesterol, caloric balance, sufficient intake of fresh fruits, vegetables,  fiber, calcium, iron, and 1 mg of folate supplement per day (for females capable of pregnancy).   [x]      Stressed the importance of regular exercise.    [x]     Substance Abuse: Discussed cessation/primary prevention of tobacco, alcohol, or other drug use; driving or other dangerous activities under the influence; availability of treatment for abuse.    [x]      Injury prevention: Discussed safety belts, safety helmets, smoke detector, smoking near bedding or upholstery.    [x]      Sexuality: Discussed sexually transmitted diseases, partner selection, use of condoms, avoidance of unintended pregnancy  and contraceptive alternatives.    [x]     Dental health: Discussed importance of regular tooth brushing, flossing, and dental visits.   [x]      Health maintenance and immunizations reviewed. Please refer to Health maintenance section.   I,Havlyn C Ratchford,acting as a Education administrator for [employer], PA.,have documented all relevant documentation on the behalf of Inda Coke, PA,as directed by  Inda Coke, PA while in the presence of Inda Coke, [place].   ***  Inda Coke, PA-C [place]

## 2020-12-14 DIAGNOSIS — D126 Benign neoplasm of colon, unspecified: Principal | ICD-10-CM

## 2020-12-18 DIAGNOSIS — D500 Iron deficiency anemia secondary to blood loss (chronic): Principal | ICD-10-CM

## 2020-12-18 DIAGNOSIS — M6281 Muscle weakness (generalized): Secondary | ICD-10-CM | POA: Diagnosis not present

## 2020-12-25 ENCOUNTER — Ambulatory Visit: Admit: 2020-12-25 | Discharge: 2020-12-26 | Payer: PRIVATE HEALTH INSURANCE

## 2020-12-25 ENCOUNTER — Other Ambulatory Visit: Admit: 2020-12-25 | Discharge: 2020-12-26 | Payer: PRIVATE HEALTH INSURANCE

## 2020-12-25 DIAGNOSIS — D500 Iron deficiency anemia secondary to blood loss (chronic): Secondary | ICD-10-CM | POA: Diagnosis not present

## 2020-12-27 DIAGNOSIS — M6281 Muscle weakness (generalized): Secondary | ICD-10-CM | POA: Diagnosis not present

## 2020-12-28 MED ORDER — HYOSCYAMINE 0.125 MG SUBLINGUAL TABLET
ORAL_TABLET | SUBLINGUAL | 3 refills | 10.00000 days | Status: CP | PRN
Start: 2020-12-28 — End: ?

## 2021-01-01 ENCOUNTER — Ambulatory Visit: Admit: 2021-01-01 | Discharge: 2021-01-02 | Payer: PRIVATE HEALTH INSURANCE

## 2021-01-01 ENCOUNTER — Other Ambulatory Visit: Admit: 2021-01-01 | Discharge: 2021-01-02 | Payer: PRIVATE HEALTH INSURANCE

## 2021-01-01 DIAGNOSIS — D500 Iron deficiency anemia secondary to blood loss (chronic): Secondary | ICD-10-CM | POA: Diagnosis not present

## 2021-01-08 ENCOUNTER — Other Ambulatory Visit: Admit: 2021-01-08 | Discharge: 2021-01-09 | Payer: PRIVATE HEALTH INSURANCE

## 2021-01-08 ENCOUNTER — Ambulatory Visit: Admit: 2021-01-08 | Discharge: 2021-01-09 | Payer: PRIVATE HEALTH INSURANCE

## 2021-01-08 DIAGNOSIS — D500 Iron deficiency anemia secondary to blood loss (chronic): Secondary | ICD-10-CM | POA: Diagnosis not present

## 2021-01-10 ENCOUNTER — Ambulatory Visit
Admit: 2021-01-10 | Discharge: 2021-01-11 | Payer: PRIVATE HEALTH INSURANCE | Attending: Gastroenterology | Primary: Gastroenterology

## 2021-01-10 DIAGNOSIS — D126 Benign neoplasm of colon, unspecified: Principal | ICD-10-CM

## 2021-01-10 DIAGNOSIS — K91850 Pouchitis: Principal | ICD-10-CM

## 2021-01-10 MED ORDER — CIPROFLOXACIN 500 MG TABLET
ORAL_TABLET | Freq: Two times a day (BID) | ORAL | 0 refills | 30 days | Status: CP
Start: 2021-01-10 — End: 2021-02-09

## 2021-01-15 ENCOUNTER — Other Ambulatory Visit: Admit: 2021-01-15 | Discharge: 2021-01-16 | Payer: PRIVATE HEALTH INSURANCE

## 2021-01-15 ENCOUNTER — Ambulatory Visit: Admit: 2021-01-15 | Discharge: 2021-01-16 | Payer: PRIVATE HEALTH INSURANCE

## 2021-01-15 DIAGNOSIS — Z86010 Personal history of colonic polyps: Principal | ICD-10-CM

## 2021-01-15 DIAGNOSIS — D485 Neoplasm of uncertain behavior of skin: Principal | ICD-10-CM

## 2021-01-15 DIAGNOSIS — D500 Iron deficiency anemia secondary to blood loss (chronic): Principal | ICD-10-CM

## 2021-01-17 DIAGNOSIS — D481 Neoplasm of uncertain behavior of connective and other soft tissue: Principal | ICD-10-CM

## 2021-01-17 DIAGNOSIS — M6281 Muscle weakness (generalized): Secondary | ICD-10-CM | POA: Diagnosis not present

## 2021-01-21 DIAGNOSIS — M6281 Muscle weakness (generalized): Secondary | ICD-10-CM | POA: Diagnosis not present

## 2021-01-23 ENCOUNTER — Ambulatory Visit: Admit: 2021-01-23 | Discharge: 2021-01-23 | Payer: PRIVATE HEALTH INSURANCE

## 2021-01-23 ENCOUNTER — Encounter: Admit: 2021-01-23 | Discharge: 2021-01-23 | Payer: PRIVATE HEALTH INSURANCE

## 2021-01-23 DIAGNOSIS — Z86010 Personal history of colonic polyps: Secondary | ICD-10-CM | POA: Diagnosis not present

## 2021-01-23 DIAGNOSIS — Z09 Encounter for follow-up examination after completed treatment for conditions other than malignant neoplasm: Secondary | ICD-10-CM | POA: Diagnosis not present

## 2021-01-23 DIAGNOSIS — Z980 Intestinal bypass and anastomosis status: Secondary | ICD-10-CM | POA: Diagnosis not present

## 2021-01-23 DIAGNOSIS — Z8616 Personal history of COVID-19: Secondary | ICD-10-CM | POA: Diagnosis not present

## 2021-01-23 DIAGNOSIS — K91850 Pouchitis: Secondary | ICD-10-CM | POA: Diagnosis not present

## 2021-01-23 DIAGNOSIS — Z885 Allergy status to narcotic agent status: Secondary | ICD-10-CM | POA: Diagnosis not present

## 2021-01-23 DIAGNOSIS — K624 Stenosis of anus and rectum: Secondary | ICD-10-CM | POA: Diagnosis not present

## 2021-01-23 DIAGNOSIS — K219 Gastro-esophageal reflux disease without esophagitis: Secondary | ICD-10-CM | POA: Diagnosis not present

## 2021-01-23 DIAGNOSIS — D509 Iron deficiency anemia, unspecified: Secondary | ICD-10-CM | POA: Diagnosis not present

## 2021-01-23 MED ORDER — TRAMADOL 50 MG TABLET
ORAL_TABLET | Freq: Three times a day (TID) | ORAL | 0 refills | 10 days | Status: CP | PRN
Start: 2021-01-23 — End: ?

## 2021-01-23 MED ORDER — DESIPRAMINE 25 MG TABLET
ORAL_TABLET | 6 refills | 0 days | Status: CP
Start: 2021-01-23 — End: ?

## 2021-01-29 DIAGNOSIS — M6281 Muscle weakness (generalized): Secondary | ICD-10-CM | POA: Diagnosis not present

## 2021-02-13 ENCOUNTER — Ambulatory Visit: Admit: 2021-02-13 | Discharge: 2021-02-13 | Payer: PRIVATE HEALTH INSURANCE

## 2021-02-13 DIAGNOSIS — Q8789 Other specified congenital malformation syndromes, not elsewhere classified: Principal | ICD-10-CM

## 2021-02-13 DIAGNOSIS — D500 Iron deficiency anemia secondary to blood loss (chronic): Principal | ICD-10-CM

## 2021-02-13 DIAGNOSIS — Z9221 Personal history of antineoplastic chemotherapy: Secondary | ICD-10-CM | POA: Diagnosis not present

## 2021-02-13 DIAGNOSIS — Z86010 Personal history of colonic polyps: Secondary | ICD-10-CM | POA: Diagnosis not present

## 2021-02-13 DIAGNOSIS — K219 Gastro-esophageal reflux disease without esophagitis: Secondary | ICD-10-CM | POA: Diagnosis not present

## 2021-02-13 DIAGNOSIS — Z79899 Other long term (current) drug therapy: Secondary | ICD-10-CM | POA: Diagnosis not present

## 2021-02-13 DIAGNOSIS — E041 Nontoxic single thyroid nodule: Secondary | ICD-10-CM | POA: Diagnosis not present

## 2021-02-13 DIAGNOSIS — Z8616 Personal history of COVID-19: Secondary | ICD-10-CM | POA: Diagnosis not present

## 2021-02-14 ENCOUNTER — Ambulatory Visit: Admit: 2021-02-14 | Discharge: 2021-02-14 | Payer: PRIVATE HEALTH INSURANCE

## 2021-02-14 ENCOUNTER — Encounter
Admit: 2021-02-14 | Discharge: 2021-02-14 | Payer: PRIVATE HEALTH INSURANCE | Attending: Anesthesiology | Primary: Anesthesiology

## 2021-02-14 DIAGNOSIS — D481 Neoplasm of uncertain behavior of connective and other soft tissue: Principal | ICD-10-CM

## 2021-02-14 DIAGNOSIS — R221 Localized swelling, mass and lump, neck: Secondary | ICD-10-CM | POA: Diagnosis not present

## 2021-02-14 DIAGNOSIS — D509 Iron deficiency anemia, unspecified: Secondary | ICD-10-CM | POA: Diagnosis not present

## 2021-02-14 DIAGNOSIS — D692 Other nonthrombocytopenic purpura: Secondary | ICD-10-CM | POA: Diagnosis not present

## 2021-02-14 DIAGNOSIS — Z91040 Latex allergy status: Secondary | ICD-10-CM | POA: Diagnosis not present

## 2021-02-14 DIAGNOSIS — R2242 Localized swelling, mass and lump, left lower limb: Secondary | ICD-10-CM | POA: Diagnosis not present

## 2021-02-14 DIAGNOSIS — Z79899 Other long term (current) drug therapy: Secondary | ICD-10-CM | POA: Diagnosis not present

## 2021-02-14 DIAGNOSIS — Z8616 Personal history of COVID-19: Secondary | ICD-10-CM | POA: Diagnosis not present

## 2021-02-14 DIAGNOSIS — Z885 Allergy status to narcotic agent status: Secondary | ICD-10-CM | POA: Diagnosis not present

## 2021-02-14 DIAGNOSIS — K219 Gastro-esophageal reflux disease without esophagitis: Secondary | ICD-10-CM | POA: Diagnosis not present

## 2021-02-14 MED ORDER — DESIPRAMINE 25 MG TABLET
ORAL_TABLET | 3 refills | 0.00000 days
Start: 2021-02-14 — End: ?

## 2021-02-14 MED ORDER — OXYCODONE 5 MG TABLET
ORAL_TABLET | Freq: Three times a day (TID) | ORAL | 0 refills | 4 days | Status: CP | PRN
Start: 2021-02-14 — End: 2021-02-19

## 2021-02-15 ENCOUNTER — Encounter (HOSPITAL_BASED_OUTPATIENT_CLINIC_OR_DEPARTMENT_OTHER): Payer: Self-pay

## 2021-02-15 ENCOUNTER — Other Ambulatory Visit: Payer: Self-pay

## 2021-02-15 ENCOUNTER — Inpatient Hospital Stay (HOSPITAL_BASED_OUTPATIENT_CLINIC_OR_DEPARTMENT_OTHER)
Admission: EM | Admit: 2021-02-15 | Discharge: 2021-03-02 | DRG: 357 | Disposition: A | Discharge status: Home Health | Payer: BC Managed Care – PPO | Attending: Internal Medicine | Admitting: Internal Medicine

## 2021-02-15 ENCOUNTER — Emergency Department (HOSPITAL_BASED_OUTPATIENT_CLINIC_OR_DEPARTMENT_OTHER): Payer: BC Managed Care – PPO

## 2021-02-15 DIAGNOSIS — Z79899 Other long term (current) drug therapy: Secondary | ICD-10-CM | POA: Diagnosis not present

## 2021-02-15 DIAGNOSIS — E876 Hypokalemia: Secondary | ICD-10-CM | POA: Diagnosis present

## 2021-02-15 DIAGNOSIS — Z23 Encounter for immunization: Secondary | ICD-10-CM

## 2021-02-15 DIAGNOSIS — R627 Adult failure to thrive: Secondary | ICD-10-CM | POA: Diagnosis not present

## 2021-02-15 DIAGNOSIS — D649 Anemia, unspecified: Secondary | ICD-10-CM | POA: Diagnosis present

## 2021-02-15 DIAGNOSIS — K56600 Partial intestinal obstruction, unspecified as to cause: Secondary | ICD-10-CM | POA: Diagnosis not present

## 2021-02-15 DIAGNOSIS — R1084 Generalized abdominal pain: Secondary | ICD-10-CM | POA: Diagnosis not present

## 2021-02-15 DIAGNOSIS — E162 Hypoglycemia, unspecified: Secondary | ICD-10-CM | POA: Diagnosis not present

## 2021-02-15 DIAGNOSIS — Z9221 Personal history of antineoplastic chemotherapy: Secondary | ICD-10-CM

## 2021-02-15 DIAGNOSIS — Y838 Other surgical procedures as the cause of abnormal reaction of the patient, or of later complication, without mention of misadventure at the time of the procedure: Secondary | ICD-10-CM | POA: Diagnosis present

## 2021-02-15 DIAGNOSIS — K921 Melena: Secondary | ICD-10-CM | POA: Diagnosis not present

## 2021-02-15 DIAGNOSIS — K91850 Pouchitis: Secondary | ICD-10-CM | POA: Diagnosis present

## 2021-02-15 DIAGNOSIS — E860 Dehydration: Secondary | ICD-10-CM | POA: Diagnosis present

## 2021-02-15 DIAGNOSIS — Q8789 Other specified congenital malformation syndromes, not elsewhere classified: Secondary | ICD-10-CM | POA: Diagnosis not present

## 2021-02-15 DIAGNOSIS — D126 Benign neoplasm of colon, unspecified: Secondary | ICD-10-CM | POA: Diagnosis not present

## 2021-02-15 DIAGNOSIS — K56609 Unspecified intestinal obstruction, unspecified as to partial versus complete obstruction: Secondary | ICD-10-CM | POA: Diagnosis present

## 2021-02-15 DIAGNOSIS — Z20822 Contact with and (suspected) exposure to covid-19: Secondary | ICD-10-CM | POA: Diagnosis not present

## 2021-02-15 DIAGNOSIS — E440 Moderate protein-calorie malnutrition: Secondary | ICD-10-CM | POA: Diagnosis not present

## 2021-02-15 DIAGNOSIS — K624 Stenosis of anus and rectum: Secondary | ICD-10-CM | POA: Diagnosis not present

## 2021-02-15 DIAGNOSIS — Z9049 Acquired absence of other specified parts of digestive tract: Secondary | ICD-10-CM | POA: Diagnosis not present

## 2021-02-15 DIAGNOSIS — Z881 Allergy status to other antibiotic agents status: Secondary | ICD-10-CM | POA: Diagnosis not present

## 2021-02-15 DIAGNOSIS — Z681 Body mass index (BMI) 19 or less, adult: Secondary | ICD-10-CM | POA: Diagnosis not present

## 2021-02-15 DIAGNOSIS — Z8719 Personal history of other diseases of the digestive system: Secondary | ICD-10-CM | POA: Diagnosis not present

## 2021-02-15 DIAGNOSIS — R109 Unspecified abdominal pain: Secondary | ICD-10-CM | POA: Diagnosis not present

## 2021-02-15 DIAGNOSIS — R739 Hyperglycemia, unspecified: Secondary | ICD-10-CM | POA: Diagnosis not present

## 2021-02-15 DIAGNOSIS — G8929 Other chronic pain: Secondary | ICD-10-CM | POA: Diagnosis present

## 2021-02-15 DIAGNOSIS — K219 Gastro-esophageal reflux disease without esophagitis: Secondary | ICD-10-CM | POA: Diagnosis present

## 2021-02-15 DIAGNOSIS — R339 Retention of urine, unspecified: Secondary | ICD-10-CM | POA: Diagnosis not present

## 2021-02-15 DIAGNOSIS — R112 Nausea with vomiting, unspecified: Secondary | ICD-10-CM | POA: Diagnosis not present

## 2021-02-15 DIAGNOSIS — N3289 Other specified disorders of bladder: Secondary | ICD-10-CM | POA: Diagnosis not present

## 2021-02-15 DIAGNOSIS — R1110 Vomiting, unspecified: Secondary | ICD-10-CM | POA: Diagnosis not present

## 2021-02-15 DIAGNOSIS — Z888 Allergy status to other drugs, medicaments and biological substances status: Secondary | ICD-10-CM | POA: Diagnosis not present

## 2021-02-15 DIAGNOSIS — Z932 Ileostomy status: Secondary | ICD-10-CM | POA: Diagnosis not present

## 2021-02-15 DIAGNOSIS — D485 Neoplasm of uncertain behavior of skin: Secondary | ICD-10-CM | POA: Diagnosis present

## 2021-02-15 HISTORY — DX: Ileus, unspecified: K56.7

## 2021-02-15 LAB — URINALYSIS, ROUTINE W REFLEX MICROSCOPIC
Bilirubin Urine: NEGATIVE
Glucose, UA: NEGATIVE mg/dL
Hgb urine dipstick: NEGATIVE
Ketones, ur: NEGATIVE mg/dL
Leukocytes,Ua: NEGATIVE
Nitrite: NEGATIVE
Protein, ur: NEGATIVE mg/dL
Specific Gravity, Urine: 1.011 (ref 1.005–1.030)
pH: 7.5 (ref 5.0–8.0)

## 2021-02-15 LAB — COMPREHENSIVE METABOLIC PANEL
ALT: 16 U/L (ref 0–44)
AST: 17 U/L (ref 15–41)
Albumin: 4.5 g/dL (ref 3.5–5.0)
Alkaline Phosphatase: 49 U/L (ref 38–126)
Anion gap: 10 (ref 5–15)
BUN: 7 mg/dL (ref 6–20)
CO2: 26 mmol/L (ref 22–32)
Calcium: 10.1 mg/dL (ref 8.9–10.3)
Chloride: 106 mmol/L (ref 98–111)
Creatinine, Ser: 0.67 mg/dL (ref 0.44–1.00)
GFR, Estimated: >60 mL/min (ref >60)
Glucose, Bld: 105 mg/dL — ABNORMAL HIGH (ref 70–99)
Potassium: 3.6 mmol/L (ref 3.5–5.1)
Sodium: 142 mmol/L (ref 135–145)
Total Bilirubin: 0.5 mg/dL (ref 0.3–1.2)
Total Protein: 7.2 g/dL (ref 6.5–8.1)

## 2021-02-15 LAB — CBC WITH DIFFERENTIAL/PLATELET
Abs Immature Granulocytes: 0.04 10*3/uL (ref 0.00–0.07)
Basophils Absolute: 0 10*3/uL (ref 0.0–0.1)
Basophils Relative: 0 %
Eosinophils Absolute: 0 10*3/uL (ref 0.0–0.5)
Eosinophils Relative: 0 %
HCT: 44.7 % (ref 36.0–46.0)
Hemoglobin: 14.3 g/dL (ref 12.0–15.0)
Immature Granulocytes: 0 %
Lymphocytes Relative: 16 %
Lymphs Abs: 2 10*3/uL (ref 0.7–4.0)
MCH: 25.9 pg — ABNORMAL LOW (ref 26.0–34.0)
MCHC: 32 g/dL (ref 30.0–36.0)
MCV: 80.8 fL (ref 80.0–100.0)
Monocytes Absolute: 0.6 10*3/uL (ref 0.1–1.0)
Monocytes Relative: 5 %
Neutro Abs: 10.3 10*3/uL — ABNORMAL HIGH (ref 1.7–7.7)
Neutrophils Relative %: 79 %
Platelets: 275 10*3/uL (ref 150–400)
RBC: 5.53 MIL/uL — ABNORMAL HIGH (ref 3.87–5.11)
RDW: 13.1 % (ref 11.5–15.5)
WBC: 13 10*3/uL — ABNORMAL HIGH (ref 4.0–10.5)
nRBC: 0 % (ref 0.0–0.2)

## 2021-02-15 LAB — PREGNANCY, URINE: Preg Test, Ur: NEGATIVE

## 2021-02-15 LAB — LIPASE, BLOOD: Lipase: 17 U/L (ref 11–51)

## 2021-02-15 MED ORDER — DESIPRAMINE 25 MG TABLET
ORAL_TABLET | ORAL | 1 refills | 0.00000 days | Status: CP
Start: 2021-02-15 — End: ?

## 2021-02-15 MED ORDER — IOHEXOL 300 MG/ML  SOLN
100.0000 mL | Freq: Once | INTRAMUSCULAR | Status: AC | PRN
  Administered 2021-02-15: 80 mL via INTRAVENOUS

## 2021-02-15 MED ORDER — HYDROMORPHONE HCL 1 MG/ML IJ SOLN
0.5000 mg | Freq: Once | INTRAMUSCULAR | Status: AC
  Administered 2021-02-16: 0.5 mg via INTRAVENOUS
  Filled 2021-02-15: qty 1

## 2021-02-15 MED ORDER — LACTATED RINGERS IV BOLUS
1000.0000 mL | Freq: Once | INTRAVENOUS | Status: AC
  Administered 2021-02-15: 1000 mL via INTRAVENOUS

## 2021-02-15 MED ORDER — ONDANSETRON HCL 4 MG/2ML IJ SOLN
4.0000 mg | Freq: Once | INTRAMUSCULAR | Status: AC
  Administered 2021-02-15: 4 mg via INTRAVENOUS
  Filled 2021-02-15: qty 2

## 2021-02-15 MED ORDER — HYDROMORPHONE HCL 1 MG/ML IJ SOLN
0.5000 mg | Freq: Once | INTRAMUSCULAR | Status: AC
  Administered 2021-02-15: 0.5 mg via INTRAVENOUS
  Filled 2021-02-15: qty 1

## 2021-02-15 NOTE — ED Provider Notes (Addendum)
[facility] EMERGENCY DEPT Provider Note   CSN: [id number] Arrival date & time: 02/15/21  1844     History Chief Complaint  Patient presents with   Abdominal Pain    Lisa Donovan is a 20 y.o. female.   Abdominal Pain Associated symptoms: constipation, nausea and vomiting   Associated symptoms: no chest pain, no diarrhea and no shortness of breath   Patient presents abdominal pain.  Had anesthesia yesterday for biopsy of some superficial wounds/masses.  Has a history of Gardner's syndrome.  Has had a proctocolectomy previously.  Has a J-pouch.  Has been dealing with pouchitis.  However has not had a bowel movement yet today.  Normally has about 10-12 a day.  States she is passed a little gas but that is all.  Now is nausea and vomiting.  States she hurts all over her abdomen.  It is particularly bad in the right lower quadrant near her previous colostomy site.  States she has had some pain worsening in this area for the last couple weeks.  States she has been unable to keep her pain medicines down at home.    Past Medical History:  Diagnosis Date   Anemia    Colon polyps    Dx at age 30, has colonoscopy yearly   [name]    [name]    Dx at age 71   GERD (gastroesophageal reflux disease)    H/O colonoscopy    Pt has has them yearly   History of endoscopy    [facility])     Patient Active Problem List   Diagnosis Date Noted   Pain 08/04/2019   Chemotherapy-induced nausea 05/14/2019   Syncope 05/14/2019   Thyroid cyst 10/05/2018   [name] of skin 08/25/2012   Intestinal polyps 08/25/2012   Gardner's syndrome 07/14/2012   Other benign neoplasm of connective and other soft tissue of upper limb, including shoulder 02/26/2011   Benign neoplasm of skin of trunk 01/17/2004    Past Surgical History:  Procedure Laterality Date   APPENDECTOMY     COLOPROCTECTOMY W/ ILEO J POUCH     Oral surgery     Tumor removed     Numerous surgies  since she was 37 months old     OB History   No obstetric history on file.     No family history on file.  Social History   Tobacco Use   Smoking status: Never   Smokeless tobacco: Never  Vaping Use   Vaping Use: Never used  Substance Use Topics   Alcohol use: Never   Drug use: Never    Home Medications Prior to Admission medications   Medication Sig Start Date End Date Taking? Authorizing Provider  benzonatate (TESSALON) 100 MG capsule Take 1 capsule (100 mg total) by mouth 3 (three) times daily as needed for cough. Patient not taking: Reported on 12/12/2020 01/31/20   Inda Coke, PA  chlorpheniramine-HYDROcodone [facility]) 10-8 MG/5ML SUER Take 5 mLs by mouth at bedtime as needed for cough. Patient not taking: Reported on 12/12/2020 01/31/20   Inda Coke, PA  hyoscyamine (LEVSIN SL) 0.125 MG SL tablet Take 0.125 mg by mouth every 6 (six) hours as needed for cramping. Patient not taking: Reported on 12/12/2020 05/16/20   [provider]  Ibuprofen 200 MG CAPS Take 1 capsule by mouth daily as needed (pain).    [provider]  methocarbamol (ROBAXIN) 500 MG tablet Take 500-1,000 mg by mouth every 6 (  six) hours as needed for muscle spasms. 08/29/20   [provider]  mirtazapine (REMERON) 7.5 MG tablet Take 7.5 mg by mouth at bedtime. 06/08/20   [provider]  Multiple Vitamin (MULTIVITAMIN ADULT PO) Inject 10 mLs into the skin daily. Inject in TPN    [provider]  omeprazole (PRILOSEC) 40 MG capsule Take 40 mg by mouth daily as needed (acid reflux). 08/24/19   [provider]  ondansetron (ZOFRAN-ODT) 4 MG disintegrating tablet Take 1 tablet (4 mg total) by mouth every 8 (eight) hours as needed for nausea or vomiting. 12/12/20   Inda Coke, PA  oxyCODONE (OXY IR/ROXICODONE) 5 MG immediate release tablet Take 5 mg by mouth every 6 (six) hours as needed for moderate pain or severe pain. 09/07/20    [provider]    Allergies    Compazine [prochlorperazine], Morphine and related, Zosyn [piperacillin-tazobactam in dex], and Neomycin  Review of Systems   Review of Systems  Constitutional:  Positive for appetite change.  HENT:  Negative for congestion.   Respiratory:  Negative for shortness of breath.   Cardiovascular:  Negative for chest pain.  Gastrointestinal:  Positive for abdominal pain, constipation, nausea and vomiting. Negative for diarrhea.  Genitourinary:  Negative for flank pain.  Musculoskeletal:  Negative for back pain.  Skin:  Negative for rash.  Neurological:  Negative for weakness.   Physical Exam Updated Vital Signs BP 117/71   Pulse 66   Temp 98.4 F (36.9 C) (Oral)   Resp 12   Ht 5\' 5"  (1.651 m)   Wt 53.1 kg   LMP 02/08/2021 (Exact Date)   SpO2 100%   BMI 19.47 kg/m   Physical Exam Vitals and nursing note reviewed.  HENT:     Head: Atraumatic.  Cardiovascular:     Rate and Rhythm: Normal rate and regular rhythm.  Pulmonary:     Breath sounds: Normal breath sounds.  Abdominal:     General: A surgical scar is present.     Hernia: No hernia is present.     Comments: Diffuse tenderness without clear distention.  Colostomy scar right lower quadrant.  Skin:    General: Skin is warm.     Capillary Refill: Capillary refill takes less than 2 seconds.  Neurological:     Mental Status: She is alert and oriented to person, place, and time.    ED Results / Procedures / Treatments   Labs (all labs ordered are listed, but only abnormal results are displayed) Labs Reviewed  COMPREHENSIVE METABOLIC PANEL - Abnormal; Notable for the following components:      Result Value   Glucose, Bld 105 (*)    All other components within normal limits  CBC WITH DIFFERENTIAL/PLATELET - Abnormal; Notable for the following components:   WBC 13.0 (*)    RBC 5.53 (*)    MCH 25.9 (*)    Neutro Abs 10.3 (*)    All other components within normal limits   LIPASE, BLOOD  URINALYSIS, ROUTINE W REFLEX MICROSCOPIC  PREGNANCY, URINE    EKG None  Radiology CT ABDOMEN PELVIS W CONTRAST  Result Date: 02/15/2021 CLINICAL DATA:  Abdominal pain, nausea, vomiting EXAM: CT ABDOMEN AND PELVIS WITH CONTRAST TECHNIQUE: Multidetector CT imaging of the abdomen and pelvis was performed using the standard protocol following bolus administration of intravenous contrast. CONTRAST:  73mL OMNIPAQUE IOHEXOL 300 MG/ML  SOLN COMPARISON:  None. FINDINGS: Lower chest: No acute abnormality. Hepatobiliary: Scattered subcentimeter cysts. No suspicious focal hepatic  abnormality. Focal fatty infiltration adjacent to the falciform ligament. Gallbladder unremarkable. Pancreas: No focal abnormality or ductal dilatation. Spleen: No focal abnormality.  Normal size. Adrenals/Urinary Tract: 9 mm cyst in the midpole of the left kidney. No hydronephrosis. Adrenal glands and urinary bladder unremarkable. Stomach/Bowel: Prior colectomy. Mid to distal small bowel loops are mildly dilated and fluid-filled with scattered air-fluid levels. Proximal small bowel and stomach decompressed. Is difficult to exclude partial small bowel obstruction. Vascular/Lymphatic: No evidence of aneurysm or adenopathy. Reproductive: Uterus and adnexa unremarkable.  No mass. Other: Small amount of free fluid in the pelvis and adjacent to the liver. No free air. Musculoskeletal: No acute bony abnormality. IMPRESSION: Prior colectomy. Mid to distal small bowel loops are prominent with air-fluid levels. Difficult to completely exclude distal partial small bowel obstruction. Small amount of free fluid in the abdomen and pelvis. Electronically Signed   By: Rolm Baptise M.D.   On: 02/15/2021 23:39    Procedures Procedures   Medications Ordered in ED Medications  lactated ringers bolus 1,000 mL (0 mLs Intravenous Stopped 02/15/21 2251)  ondansetron (ZOFRAN) injection 4 mg (4 mg Intravenous Given 02/15/21 2108)   HYDROmorphone (DILAUDID) injection 0.5 mg (0.5 mg Intravenous Given 02/15/21 2111)  HYDROmorphone (DILAUDID) injection 0.5 mg (0.5 mg Intravenous Given 02/15/21 2207)  iohexol (OMNIPAQUE) 300 MG/ML solution 100 mL (80 mLs Intravenous Contrast Given 02/15/21 2318)    ED Course  I have reviewed the triage vital signs and the nursing notes.  Pertinent labs & imaging results that were available during my care of the patient were reviewed by me and considered in my medical decision making (see chart for details).    MDM Rules/Calculators/A&P                           Patient with abdominal pain.  Yesterday had anesthesia for soft tissue issues.  Now increasing abdominal pain.  Nausea vomiting.  No bowel movements.  Usually has 10 bowel movements a day since she has a J-pouch.  Abdominal tenderness.  White count mildly elevated.  CT scan pending.  Care turned over to Dr. Wyvonnia Dusky  CT scan showed possible bowel obstruction in the mid to distal bowel.  Continued symptoms.  Will admit to [facility] if possible Final Clinical Impression(s) / ED Diagnoses Final diagnoses:  Generalized abdominal pain    Rx / DC Orders ED Discharge Orders     None        Davonna Belling, MD 02/15/21 2314    Davonna Belling, MD 02/15/21 2358

## 2021-02-15 NOTE — ED Triage Notes (Signed)
Abdominal pain with nausea and vomiting started around 4 pm today.  S/P biopsy of left leg fascia.

## 2021-02-16 DIAGNOSIS — K56600 Partial intestinal obstruction, unspecified as to cause: Secondary | ICD-10-CM | POA: Diagnosis present

## 2021-02-16 DIAGNOSIS — K56609 Unspecified intestinal obstruction, unspecified as to partial versus complete obstruction: Secondary | ICD-10-CM | POA: Diagnosis present

## 2021-02-16 DIAGNOSIS — E876 Hypokalemia: Secondary | ICD-10-CM | POA: Diagnosis present

## 2021-02-16 DIAGNOSIS — Z681 Body mass index (BMI) 19 or less, adult: Secondary | ICD-10-CM | POA: Diagnosis not present

## 2021-02-16 DIAGNOSIS — Y838 Other surgical procedures as the cause of abnormal reaction of the patient, or of later complication, without mention of misadventure at the time of the procedure: Secondary | ICD-10-CM | POA: Diagnosis present

## 2021-02-16 DIAGNOSIS — R339 Retention of urine, unspecified: Secondary | ICD-10-CM | POA: Diagnosis not present

## 2021-02-16 DIAGNOSIS — Z79899 Other long term (current) drug therapy: Secondary | ICD-10-CM | POA: Diagnosis not present

## 2021-02-16 DIAGNOSIS — R739 Hyperglycemia, unspecified: Secondary | ICD-10-CM | POA: Diagnosis present

## 2021-02-16 DIAGNOSIS — K91850 Pouchitis: Secondary | ICD-10-CM | POA: Diagnosis present

## 2021-02-16 DIAGNOSIS — Z9221 Personal history of antineoplastic chemotherapy: Secondary | ICD-10-CM | POA: Diagnosis not present

## 2021-02-16 DIAGNOSIS — Z8719 Personal history of other diseases of the digestive system: Secondary | ICD-10-CM | POA: Diagnosis not present

## 2021-02-16 DIAGNOSIS — K219 Gastro-esophageal reflux disease without esophagitis: Secondary | ICD-10-CM | POA: Diagnosis present

## 2021-02-16 DIAGNOSIS — D649 Anemia, unspecified: Secondary | ICD-10-CM | POA: Diagnosis present

## 2021-02-16 DIAGNOSIS — R1084 Generalized abdominal pain: Secondary | ICD-10-CM | POA: Diagnosis not present

## 2021-02-16 DIAGNOSIS — Z881 Allergy status to other antibiotic agents status: Secondary | ICD-10-CM | POA: Diagnosis not present

## 2021-02-16 DIAGNOSIS — G8929 Other chronic pain: Secondary | ICD-10-CM | POA: Diagnosis present

## 2021-02-16 DIAGNOSIS — E440 Moderate protein-calorie malnutrition: Secondary | ICD-10-CM | POA: Diagnosis present

## 2021-02-16 DIAGNOSIS — E860 Dehydration: Secondary | ICD-10-CM | POA: Diagnosis present

## 2021-02-16 DIAGNOSIS — D485 Neoplasm of uncertain behavior of skin: Secondary | ICD-10-CM | POA: Diagnosis present

## 2021-02-16 DIAGNOSIS — Z23 Encounter for immunization: Secondary | ICD-10-CM | POA: Diagnosis not present

## 2021-02-16 DIAGNOSIS — N3289 Other specified disorders of bladder: Secondary | ICD-10-CM | POA: Diagnosis not present

## 2021-02-16 DIAGNOSIS — Z20822 Contact with and (suspected) exposure to covid-19: Secondary | ICD-10-CM | POA: Diagnosis present

## 2021-02-16 DIAGNOSIS — K921 Melena: Secondary | ICD-10-CM | POA: Diagnosis not present

## 2021-02-16 DIAGNOSIS — Z9049 Acquired absence of other specified parts of digestive tract: Secondary | ICD-10-CM | POA: Diagnosis not present

## 2021-02-16 DIAGNOSIS — R627 Adult failure to thrive: Secondary | ICD-10-CM | POA: Diagnosis present

## 2021-02-16 DIAGNOSIS — Z888 Allergy status to other drugs, medicaments and biological substances status: Secondary | ICD-10-CM | POA: Diagnosis not present

## 2021-02-16 DIAGNOSIS — E162 Hypoglycemia, unspecified: Secondary | ICD-10-CM | POA: Diagnosis not present

## 2021-02-16 LAB — RESP PANEL BY RT-PCR (FLU A&B, COVID) ARPGX2
Influenza A by PCR: NEGATIVE
Influenza B by PCR: NEGATIVE
SARS Coronavirus 2 by RT PCR: NEGATIVE

## 2021-02-16 LAB — HIV ANTIBODY (ROUTINE TESTING W REFLEX): HIV Screen 4th Generation wRfx: NONREACTIVE

## 2021-02-16 MED ORDER — SODIUM CHLORIDE 0.9 % IV SOLN: INTRAVENOUS | Status: DC

## 2021-02-16 MED ORDER — CHLORHEXIDINE GLUCONATE CLOTH 2 % EX PADS
6.0000 | MEDICATED_PAD | Freq: Every day | CUTANEOUS | Status: DC
  Administered 2021-02-16: 6 via TOPICAL

## 2021-02-16 MED ORDER — HYDROMORPHONE HCL 1 MG/ML IJ SOLN
0.5000 mg | INTRAMUSCULAR | Status: DC | PRN
  Administered 2021-02-16 – 2021-02-18 (×8): 0.5 mg via INTRAVENOUS
  Filled 2021-02-16 (×9): qty 0.5

## 2021-02-16 MED ORDER — HYDROMORPHONE HCL 1 MG/ML IJ SOLN
0.5000 mg | Freq: Once | INTRAMUSCULAR | Status: AC
  Administered 2021-02-16: 0.5 mg via INTRAVENOUS
  Filled 2021-02-16: qty 1

## 2021-02-16 MED ORDER — ONDANSETRON HCL 4 MG/2ML IJ SOLN
4.0000 mg | Freq: Three times a day (TID) | INTRAMUSCULAR | Status: DC | PRN
  Administered 2021-02-16: 4 mg via INTRAVENOUS
  Filled 2021-02-16 (×2): qty 2

## 2021-02-16 MED ORDER — ONDANSETRON HCL 4 MG/2ML IJ SOLN
4.0000 mg | Freq: Once | INTRAMUSCULAR | Status: AC
  Administered 2021-02-16: 4 mg via INTRAVENOUS
  Filled 2021-02-16: qty 2

## 2021-02-16 MED ORDER — ZINC OXIDE 40 % EX OINT
TOPICAL_OINTMENT | Freq: Three times a day (TID) | CUTANEOUS | Status: DC
  Administered 2021-02-16 – 2021-03-02 (×4): 1 via TOPICAL
  Filled 2021-02-16: qty 57

## 2021-02-16 MED ORDER — ONDANSETRON HCL 4 MG/2ML IJ SOLN
4.0000 mg | Freq: Four times a day (QID) | INTRAMUSCULAR | Status: DC | PRN
  Administered 2021-02-16 – 2021-03-02 (×37): 4 mg via INTRAVENOUS
  Filled 2021-02-16 (×37): qty 2

## 2021-02-16 MED ORDER — FENTANYL CITRATE PF 50 MCG/ML IJ SOSY
25.0000 ug | PREFILLED_SYRINGE | INTRAMUSCULAR | Status: DC | PRN
  Administered 2021-02-16 (×2): 25 ug via INTRAVENOUS
  Filled 2021-02-16 (×2): qty 1

## 2021-02-16 MED ORDER — SODIUM CHLORIDE 0.9 % IV SOLN: INTRAVENOUS | Status: AC

## 2021-02-16 NOTE — ED Notes (Signed)
Report called to care link 

## 2021-02-16 NOTE — ED Provider Notes (Signed)
Care assumed from Dr. Alvino Chapel.  Patient with [name] syndrome, colectomy, J-pouch here with no bowel movement, passing little gas, nausea vomiting and abdominal distention.  Concern for early ileus versus obstruction on CT scan.  Informed by [facility] there is no bed.  Informed by [facility] there is no bed.  D/w [facility] Dr. Thermon Leyland.  Reviewed patient's CT scan.  He agrees there is no acute need for surgical intervention.  Would prefer patient be treated at [facility] where her specialists are.  She recently had dilation of her anastomosis. He does not feel patient needs an NG tube but agrees with supportive care and hydration.  D/w [facility] Dr. Mariea Clonts.  He feels this is a surgical issue due to possible bowel obstruction.  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  Does not feel she needs emergent transfer tonight but recommends IV fluids and pain and nausea control.  No acute GI intervention.  Discussed with [facility] transfer center multiple times.  No beds at [facility] or [facility] or [facility].  D/w Dr. Thermon Leyland again will be available to consult on patient as needed.  Admission to medical service discussed with Dr. Hal Hope.  Symptom control overnight with hopeful transfer to [facility] in the morning.   Ezequiel Essex, MD 02/16/21 [phone]

## 2021-02-16 NOTE — H&P (Signed)
History and Physical    Lisa Donovan UEA:[id number] DOB: 2000-05-17 DOA: 02/15/2021  PCP: Inda Coke, PA  Patient coming from: Home  Chief Complaint: ab pain, N/V  HPI: Lisa Donovan is a 20 y.o. female with medical history significant of familial adenomatous polyposis, [name], s/p proctocolectomy/IPAA. Presenting with abdominal pain, N/V. She reports she has a tumor Bx and steroid injections performed a couple of days ago. After the procedure, she went home and ate without a problem. The next morning she was awakened from sleep when she began having sharp, constant stabbing pains along the lower hemisphere of her abdomen. She thought it was indigestion, so she tried walking around. This did not help. She noticed that she had only had one BM since eating; which was not normal for her and she has movements into her J-puch at least 8x/day. She tried doing to the bathroom, but she was unable to. She began vomiting profusely. This reminded her of having an ileus in the past. So she went to urgent care. They recommended that she come to the ED.       ED Course: CT showed partial SBO. General surgery was consulted. They recommended medical admission w/ surgical consultation. TRH was called for admission.   Review of Systems:  Review of systems is otherwise negative for all not mentioned in HPI.   PMHx Past Medical History:  Diagnosis Date   Anemia    Colon polyps    Dx at age 65, has colonoscopy yearly   [name]    [name]    Dx at age 37   GERD (gastroesophageal reflux disease)    H/O colonoscopy    Pt has has them yearly   History of endoscopy    [facility]leus (HCC)     PSHx Past Surgical History:  Procedure Laterality Date   APPENDECTOMY     COLOPROCTECTOMY W/ ILEO J POUCH     Oral surgery     Tumor removed     Numerous surgies since she was 63 months old    SocHx  reports that she has never smoked. She has never used smokeless tobacco. She reports  that she does not drink alcohol and does not use drugs.  Allergies  Allergen Reactions   Compazine [Prochlorperazine]     agitation   Morphine And Related Nausea And Vomiting   Zosyn [Piperacillin-Tazobactam In Dex] Itching, Dermatitis and Rash   Neomycin Swelling and Rash    FamHx No family history on file.  Prior to Admission medications   Medication Sig Start Date End Date Taking? Authorizing Provider  benzonatate (TESSALON) 100 MG capsule Take 1 capsule (100 mg total) by mouth 3 (three) times daily as needed for cough. Patient not taking: Reported on 12/12/2020 01/31/20   Inda Coke, PA  chlorpheniramine-HYDROcodone [facility]) 10-8 MG/5ML SUER Take 5 mLs by mouth at bedtime as needed for cough. Patient not taking: Reported on 12/12/2020 01/31/20   Inda Coke, PA  hyoscyamine (LEVSIN SL) 0.125 MG SL tablet Take 0.125 mg by mouth every 6 (six) hours as needed for cramping. Patient not taking: Reported on 12/12/2020 05/16/20   [provider]  Ibuprofen 200 MG CAPS Take 1 capsule by mouth daily as needed (pain).    [provider]  methocarbamol (ROBAXIN) 500 MG tablet Take 500-1,000 mg by mouth every 6 (six) hours as needed for muscle spasms. 08/29/20   [provider]  mirtazapine (REMERON) 7.5 MG tablet Take 7.5 mg  by mouth at bedtime. 06/08/20   [provider]  Multiple Vitamin (MULTIVITAMIN ADULT PO) Inject 10 mLs into the skin daily. Inject in TPN    [provider]  omeprazole (PRILOSEC) 40 MG capsule Take 40 mg by mouth daily as needed (acid reflux). 08/24/19   [provider]  ondansetron (ZOFRAN-ODT) 4 MG disintegrating tablet Take 1 tablet (4 mg total) by mouth every 8 (eight) hours as needed for nausea or vomiting. 12/12/20   Inda Coke, PA  oxyCODONE (OXY IR/ROXICODONE) 5 MG immediate release tablet Take 5 mg by mouth every 6 (six) hours as needed for moderate pain or severe pain. 09/07/20    [provider]    Physical Exam: Vitals:   02/16/21 0400 02/16/21 0430 02/16/21 0500 02/16/21 0530  BP: 112/70 105/62 (!) 98/56 (!) 94/59  Pulse: (!) 55 (!) 54 (!) 50 (!) 54  Resp: 11 10 14 13   Temp:      TempSrc:      SpO2: 99% 100% 96% 98%  Weight:      Height:        General: 20 y.o. female resting in bed in NAD Eyes: PERRL, normal sclera ENMT: Nares patent w/o discharge, orophaynx clear, dentition normal, ears w/o discharge/lesions/ulcers Neck: Supple, trachea midline Cardiovascular: RRR, +S1, S2, no m/g/r, equal pulses throughout Respiratory: CTABL, no w/r/r, normal WOB GI: BS hypoactive, ND, mild TTP to LLQ/RLQ, no masses noted, no organomegaly noted MSK: No e/c/c Neuro: A&O x 3, no focal deficits Psyc: Appropriate interaction and affect, calm/cooperative  Labs on Admission: I have personally reviewed following labs and imaging studies  CBC: Recent Labs  Lab 02/15/21 2050  WBC 13.0*  NEUTROABS 10.3*  HGB 14.3  HCT 44.7  MCV 80.8  PLT 749   Basic Metabolic Panel: Recent Labs  Lab 02/15/21 2050  NA 142  K 3.6  CL 106  CO2 26  GLUCOSE 105*  BUN 7  CREATININE 0.67  CALCIUM 10.1   GFR: Estimated Creatinine Clearance: 93.2 mL/min (by C-G formula based on SCr of 0.67 mg/dL). Liver Function Tests: Recent Labs  Lab 02/15/21 2050  AST 17  ALT 16  ALKPHOS 49  BILITOT 0.5  PROT 7.2  ALBUMIN 4.5   Recent Labs  Lab 02/15/21 2050  LIPASE 17   No results for input(s): AMMONIA in the last 168 hours. Coagulation Profile: No results for input(s): INR, PROTIME in the last 168 hours. Cardiac Enzymes: No results for input(s): CKTOTAL, CKMB, CKMBINDEX, TROPONINI in the last 168 hours. BNP (last 3 results) No results for input(s): PROBNP in the last 8760 hours. HbA1C: No results for input(s): HGBA1C in the last 72 hours. CBG: No results for input(s): GLUCAP in the last 168 hours. Lipid Profile: No results for input(s): CHOL, HDL, LDLCALC,  TRIG, CHOLHDL, LDLDIRECT in the last 72 hours. Thyroid Function Tests: No results for input(s): TSH, T4TOTAL, FREET4, T3FREE, THYROIDAB in the last 72 hours. Anemia Panel: No results for input(s): VITAMINB12, FOLATE, FERRITIN, TIBC, IRON, RETICCTPCT in the last 72 hours. Urine analysis:    Component Value Date/Time   COLORURINE YELLOW 02/15/2021 2210   APPEARANCEUR CLEAR 02/15/2021 2210   LABSPEC 1.011 02/15/2021 2210   PHURINE 7.5 02/15/2021 2210   GLUCOSEU NEGATIVE 02/15/2021 2210   HGBUR NEGATIVE 02/15/2021 2210   BILIRUBINUR NEGATIVE 02/15/2021 2210   BILIRUBINUR Negative 11/09/2019 1323   KETONESUR NEGATIVE 02/15/2021 2210   PROTEINUR NEGATIVE 02/15/2021 2210   UROBILINOGEN 0.2 11/09/2019 1323   NITRITE NEGATIVE  02/15/2021 2210   [place] 02/15/2021 2210    Radiological Exams on Admission: CT ABDOMEN PELVIS W CONTRAST  Result Date: 02/15/2021 CLINICAL DATA:  Abdominal pain, nausea, vomiting EXAM: CT ABDOMEN AND PELVIS WITH CONTRAST TECHNIQUE: Multidetector CT imaging of the abdomen and pelvis was performed using the standard protocol following bolus administration of intravenous contrast. CONTRAST:  34mL OMNIPAQUE IOHEXOL 300 MG/ML  SOLN COMPARISON:  None. FINDINGS: Lower chest: No acute abnormality. Hepatobiliary: Scattered subcentimeter cysts. No suspicious focal hepatic abnormality. Focal fatty infiltration adjacent to the falciform ligament. Gallbladder unremarkable. Pancreas: No focal abnormality or ductal dilatation. Spleen: No focal abnormality.  Normal size. Adrenals/Urinary Tract: 9 mm cyst in the midpole of the left kidney. No hydronephrosis. Adrenal glands and urinary bladder unremarkable. Stomach/Bowel: Prior colectomy. Mid to distal small bowel loops are mildly dilated and fluid-filled with scattered air-fluid levels. Proximal small bowel and stomach decompressed. Is difficult to exclude partial small bowel obstruction. Vascular/Lymphatic: No evidence of  aneurysm or adenopathy. Reproductive: Uterus and adnexa unremarkable.  No mass. Other: Small amount of free fluid in the pelvis and adjacent to the liver. No free air. Musculoskeletal: No acute bony abnormality. IMPRESSION: Prior colectomy. Mid to distal small bowel loops are prominent with air-fluid levels. Difficult to completely exclude distal partial small bowel obstruction. Small amount of free fluid in the abdomen and pelvis. Electronically Signed   By: Rolm Baptise M.D.   On: 02/15/2021 23:39    EKG: None obtained in ED  Assessment/Plan Partial SBO Hx of Gardner's Syndrome/[name] s/p proctectomy/IPAA     - she has been admitted to inpt, med-surg     - all of her care is at [facility]; the patient and family prefer to be at [facility] surgery consulted; will await their review     - Pt is nauseous, but not actively vomiting     - continue fluids, pain control, anti-emetics; no need for NGT right now  [name]     - continue outpt follow up  DVT prophylaxis: SCD  Code Status: FULL  Family Communication: w/ mother at bedside  Consults called: General Surgery   Status is: Inpatient  Remains inpatient appropriate because: severity of illness  Jonnie Finner DO [facility]  If 7PM-7AM, please contact night-coverage [email or web address]  02/16/2021, 8:53 AM

## 2021-02-16 NOTE — Consult Note (Signed)
Surgical Evaluation Requesting provider: Cherylann Ratel  Chief Complaint: abdominal pain  HPI: Very pleasant 20 year old woman with history of [name] and [name] who is status post proctocolectomy with J-pouch in June of this year and diverting loop ileostomy reversal in August this year.  Does have a history of pouchitis for which she has been chronically on antibiotics as well as stenosis at her ileoanal anastomosis requiring dilation as recently as about 3 weeks ago.  For the last week or so she has been noting some worsening abdominal pain and difficulty eating.  She underwent anesthesia for excision of desmoid tumors on 12/8 and noted that following the procedure she has not had any bowel movement or flatus which is very unusual for her, as she typically has 5-6 bowel movements a day typically about an hour after eating.  Worsening abdominal pain.  In the lower fields which is sharp and stabbing in quality.  Nausea, vomiting and dehydration. CT scan as below shows possible partial SBO versus ileus and some free fluid in the pelvis and along the liver.  Efforts were made to transfer the patient to her team at [facility] but no beds available at [facility] where her gastroenterologist is nor [facility] where her surgeon is.  Our service was contacted and we recommended patient be transferred to [facility] where her specialists are and no acute surgical intervention.  Because there were no beds at [facility] she has been transferred to [facility] for further resuscitation.  Allergies  Allergen Reactions   Compazine [Prochlorperazine]     agitation   Morphine And Related Nausea And Vomiting   Zosyn [Piperacillin-Tazobactam In Dex] Itching, Dermatitis and Rash   Neomycin Swelling and Rash    Past Medical History:  Diagnosis Date   Anemia    Colon polyps    Dx at age 15, has colonoscopy yearly   [name]    [name]    Dx at age 53   GERD (gastroesophageal reflux disease)    H/O  colonoscopy    Pt has has them yearly   History of endoscopy    Ileus [facility])     Past Surgical History:  Procedure Laterality Date   APPENDECTOMY     COLOPROCTECTOMY W/ ILEO J POUCH     Oral surgery     Tumor removed     Numerous surgies since she was 69 months old    No family history on file.  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   Current Outpatient Medications on File Prior to Encounter  Medication Sig Dispense Refill   benzonatate (TESSALON) 100 MG capsule Take 1 capsule (100 mg total) by mouth 3 (three) times daily as needed for cough. (Patient not taking: Reported on 12/12/2020) 20 capsule 0   chlorpheniramine-HYDROcodone (TUSSIONEX PENNKINETIC ER) 10-8 MG/5ML SUER Take 5 mLs by mouth at bedtime as needed for cough. (Patient not taking: Reported on 12/12/2020) 140 mL 0   hyoscyamine (LEVSIN SL) 0.125 MG SL tablet Take 0.125 mg by mouth every 6 (six) hours as needed for cramping. (Patient not taking: Reported on 12/12/2020)     Ibuprofen 200 MG CAPS Take 1 capsule by mouth daily as  needed (pain).     methocarbamol (ROBAXIN) 500 MG tablet Take 500-1,000 mg by mouth every 6 (six) hours as needed for muscle spasms.     mirtazapine (REMERON) 7.5 MG tablet Take 7.5 mg by mouth at bedtime.     Multiple Vitamin (MULTIVITAMIN ADULT PO) Inject 10 mLs into the skin daily. Inject in TPN     omeprazole (PRILOSEC) 40 MG capsule Take 40 mg by mouth daily as needed (acid reflux).     ondansetron (ZOFRAN-ODT) 4 MG disintegrating tablet Take 1 tablet (4 mg total) by mouth every 8 (eight) hours as needed for nausea or vomiting. 60 tablet 1   oxyCODONE (OXY IR/ROXICODONE) 5 MG immediate release tablet Take 5 mg by mouth every 6 (six) hours as needed for moderate pain or severe pain.      Review of Systems: a complete, 10pt review of systems was completed with pertinent positives and negatives as documented in the HPI  Physical Exam: Vitals:   02/16/21 0530 02/16/21 0911  BP: (!) 94/59 110/72  Pulse: (!) 54 60  Resp: 13 17  Temp:  98.1 F (36.7 C)  SpO2: 98% 100%   Gen: A&Ox3, no distress  Eyes: lids and conjunctivae normal, no icterus. Pupils equally round and reactive to light.  Neck: supple without mass or thyromegaly Chest: respiratory effort is normal. No crepitus or tenderness on palpation of the chest. Breath sounds equal.  Cardiovascular: RRR with palpable distal pulses, no pedal edema Gastrointestinal: soft, mildly distended, tender without peritonitis in the lower abdominal fields.  Well-healed surgical scars.  Patient declines rectal exam. Neuro: cranial nerves grossly intact.  Sensation intact to light touch diffusely. Psych: appropriate mood and affect, normal insight/judgment intact  Skin: warm and dry   CBC Latest Ref Rng & Units 02/15/2021 09/08/2020 12/22/2019  WBC 4.0 - 10.5 K/uL 13.0(H) 10.4 7.7  Hemoglobin 12.0 - 15.0 g/dL 14.3 12.2 14.5  Hematocrit 36.0 - 46.0 % 44.7 36.5 44.0  Platelets 150 - 400 K/uL 275 219 212    CMP Latest Ref Rng & Units 02/15/2021  09/08/2020 11/09/2019  Glucose 70 - 99 mg/dL 105(H) 91 82  BUN 6 - 20 mg/dL 7 14 10   Creatinine 0.44 - 1.00 mg/dL 0.67 0.41(L) 0.65  Sodium 135 - 145 mmol/L 142 138 138  Potassium 3.5 - 5.1 mmol/L 3.6 4.0 4.0  Chloride 98 - 111 mmol/L 106 105 104  CO2 22 - 32 mmol/L 26 26 27   Calcium 8.9 - 10.3 mg/dL 10.1 9.1 9.6  Total Protein 6.5 - 8.1 g/dL 7.2 6.7 7.0  Total Bilirubin 0.3 - 1.2 mg/dL 0.5 0.3 0.4  Alkaline Phos 38 - 126 U/L 49 66 -  AST 15 - 41  U/L 17 29 14   ALT 0 - 44 U/L 16 42 8    No results found for: INR, PROTIME  Imaging: CT ABDOMEN PELVIS W CONTRAST  Result Date: 02/15/2021 CLINICAL DATA:  Abdominal pain, nausea, vomiting EXAM: CT ABDOMEN AND PELVIS WITH CONTRAST TECHNIQUE: Multidetector CT imaging of the abdomen and pelvis was performed using the standard protocol following bolus administration of intravenous contrast. CONTRAST:  71mL OMNIPAQUE IOHEXOL 300 MG/ML  SOLN COMPARISON:  None. FINDINGS: Lower chest: No acute abnormality. Hepatobiliary: Scattered subcentimeter cysts. No suspicious focal hepatic abnormality. Focal fatty infiltration adjacent to the falciform ligament. Gallbladder unremarkable. Pancreas: No focal abnormality or ductal dilatation. Spleen: No focal abnormality.  Normal size. Adrenals/Urinary Tract: 9 mm cyst in the midpole of the left kidney. No hydronephrosis. Adrenal glands and urinary bladder unremarkable. Stomach/Bowel: Prior colectomy. Mid to distal small bowel loops are mildly dilated and fluid-filled with scattered air-fluid levels. Proximal small bowel and stomach decompressed. Is difficult to exclude partial small bowel obstruction. Vascular/Lymphatic: No evidence of aneurysm or adenopathy. Reproductive: Uterus and adnexa unremarkable.  No mass. Other: Small amount of free fluid in the pelvis and adjacent to the liver. No free air. Musculoskeletal: No acute bony abnormality. IMPRESSION: Prior colectomy. Mid to distal small bowel loops are prominent with  air-fluid levels. Difficult to completely exclude distal partial small bowel obstruction. Small amount of free fluid in the abdomen and pelvis. Electronically Signed   By: Rolm Baptise M.D.   On: 02/15/2021 23:39     A/P: 20 year old woman with history of [name]/[name] who is status post total colectomy/proctectomy with J-pouch and with history of pouchitis and ileoanal anastomosis stricture requiring dilation.  Presents with abdominal pain, nausea, vomiting, and no bowel movements in the last 36 hours which is very unusual for her.  CT consistent with low-grade obstruction versus ileus.  On my review, the pouch is quite full.  Given her history, suspect that she has recurrent stenosis at her ileoanal anastomosis.  Offered patient rectal exam but she knows that this is very painful for her and does not tolerate at the bedside.  If symptoms persist would recommend exam under anesthesia and likely dilation which would best be deferred to her specialist at [facility].  At this point would recommend fluid resuscitation, symptom directed pain and nausea control, keep n.p.o. except for ice chips and sips with meds.  Transfer to [facility] for further care as soon as bed available.    Patient Active Problem List   Diagnosis Date Noted   SBO (small bowel obstruction) ([place]) 02/16/2021   Pain 08/04/2019   Chemotherapy-induced nausea 05/14/2019   Syncope 05/14/2019   Thyroid cyst 10/05/2018   [name] of skin 08/25/2012   Intestinal polyps 08/25/2012   Gardner's syndrome 07/14/2012   Other benign neoplasm of connective and other soft tissue of upper limb, including shoulder 02/26/2011   Benign neoplasm of skin of trunk 01/17/2004       Romana Juniper, MD [facility], [place]  See Shea Evans to contact appropriate on-call provider

## 2021-02-16 NOTE — ED Notes (Signed)
Called [facility] to transport patient to [facility] room [phone]

## 2021-02-17 DIAGNOSIS — K56609 Unspecified intestinal obstruction, unspecified as to partial versus complete obstruction: Secondary | ICD-10-CM

## 2021-02-17 LAB — CBC
HCT: 36.4 % (ref 36.0–46.0)
Hemoglobin: 11.3 g/dL — ABNORMAL LOW (ref 12.0–15.0)
MCH: 26 pg (ref 26.0–34.0)
MCHC: 31 g/dL (ref 30.0–36.0)
MCV: 83.7 fL (ref 80.0–100.0)
Platelets: 199 10*3/uL (ref 150–400)
RBC: 4.35 MIL/uL (ref 3.87–5.11)
RDW: 12.6 % (ref 11.5–15.5)
WBC: 7.5 10*3/uL (ref 4.0–10.5)
nRBC: 0 % (ref 0.0–0.2)

## 2021-02-17 LAB — COMPREHENSIVE METABOLIC PANEL
ALT: 25 U/L (ref 0–44)
AST: 20 U/L (ref 15–41)
Albumin: 3.4 g/dL — ABNORMAL LOW (ref 3.5–5.0)
Alkaline Phosphatase: 41 U/L (ref 38–126)
Anion gap: 8 (ref 5–15)
BUN: 9 mg/dL (ref 6–20)
CO2: 23 mmol/L (ref 22–32)
Calcium: 8.6 mg/dL — ABNORMAL LOW (ref 8.9–10.3)
Chloride: 106 mmol/L (ref 98–111)
Creatinine, Ser: 0.55 mg/dL (ref 0.44–1.00)
GFR, Estimated: >60 mL/min (ref >60)
Glucose, Bld: 74 mg/dL (ref 70–99)
Potassium: 3.5 mmol/L (ref 3.5–5.1)
Sodium: 137 mmol/L (ref 135–145)
Total Bilirubin: 1 mg/dL (ref 0.3–1.2)
Total Protein: 5.9 g/dL — ABNORMAL LOW (ref 6.5–8.1)

## 2021-02-17 LAB — MAGNESIUM: Magnesium: 2.2 mg/dL (ref 1.7–2.4)

## 2021-02-17 MED ORDER — POTASSIUM CHLORIDE 10 MEQ/100ML IV SOLN
10.0000 meq | INTRAVENOUS | Status: AC
  Administered 2021-02-17 (×4): 10 meq via INTRAVENOUS
  Filled 2021-02-17 (×4): qty 100

## 2021-02-17 MED ORDER — HYDROMORPHONE HCL 1 MG/ML IJ SOLN
0.5000 mg | Freq: Four times a day (QID) | INTRAMUSCULAR | Status: AC | PRN
  Administered 2021-02-17 – 2021-02-18 (×2): 0.5 mg via INTRAVENOUS
  Filled 2021-02-17: qty 0.5

## 2021-02-17 MED ORDER — PANTOPRAZOLE SODIUM 40 MG IV SOLR
40.0000 mg | INTRAVENOUS | Status: DC
  Administered 2021-02-17 – 2021-02-19 (×3): 40 mg via INTRAVENOUS
  Filled 2021-02-17 (×3): qty 40

## 2021-02-17 NOTE — Progress Notes (Signed)
   Subjective/Chief Complaint: She has had 4 bowel movements.  1 occurred after receiving some fentanyl and falling asleep and was actually an incontinence episode, but she has had 3 additional since then.  Continues to have some abdominal pain but it is somewhat better.  She is feeling hungry.   Objective: Vital signs in last 24 hours: Temp:  [97.8 F (36.6 C)-98.6 F (37 C)] 97.8 F (36.6 C) (12/11 0616) Pulse Rate:  [55-72] 55 (12/11 0616) Resp:  [14-18] 18 (12/11 0616) BP: (105-117)/(45-72) 105/49 (12/11 0616) SpO2:  [96 %-100 %] 96 % (12/11 0616) Last BM Date: 02/16/21  Intake/Output from previous day: 12/10 0701 - 12/11 0700 In: 2137.6 [I.V.:2137.6] Out: -  Intake/Output this shift: No intake/output data recorded.  Alert, no distress Unlabored respiration Abdomen is soft, less distended than yesterday, still mildly subjectively tender in the lower fields No extremity edema  Lab Results:  Recent Labs    02/15/21 2050 02/17/21 0452  WBC 13.0* 7.5  HGB 14.3 11.3*  HCT 44.7 36.4  PLT 275 199   BMET Recent Labs    02/15/21 2050 02/17/21 0452  NA 142 137  K 3.6 3.5  CL 106 106  CO2 26 23  GLUCOSE 105* 74  BUN 7 9  CREATININE 0.67 0.55  CALCIUM 10.1 8.6*   PT/INR No results for input(s): LABPROT, INR in the last 72 hours. ABG No results for input(s): PHART, HCO3 in the last 72 hours.  Invalid input(s): PCO2, PO2  Studies/Results: CT ABDOMEN PELVIS W CONTRAST  Result Date: 02/15/2021 CLINICAL DATA:  Abdominal pain, nausea, vomiting EXAM: CT ABDOMEN AND PELVIS WITH CONTRAST TECHNIQUE: Multidetector CT imaging of the abdomen and pelvis was performed using the standard protocol following bolus administration of intravenous contrast. CONTRAST:  76mL OMNIPAQUE IOHEXOL 300 MG/ML  SOLN COMPARISON:  None. FINDINGS: Lower chest: No acute abnormality. Hepatobiliary: Scattered subcentimeter cysts. No suspicious focal hepatic abnormality. Focal fatty infiltration  adjacent to the falciform ligament. Gallbladder unremarkable. Pancreas: No focal abnormality or ductal dilatation. Spleen: No focal abnormality.  Normal size. Adrenals/Urinary Tract: 9 mm cyst in the midpole of the left kidney. No hydronephrosis. Adrenal glands and urinary bladder unremarkable. Stomach/Bowel: Prior colectomy. Mid to distal small bowel loops are mildly dilated and fluid-filled with scattered air-fluid levels. Proximal small bowel and stomach decompressed. Is difficult to exclude partial small bowel obstruction. Vascular/Lymphatic: No evidence of aneurysm or adenopathy. Reproductive: Uterus and adnexa unremarkable.  No mass. Other: Small amount of free fluid in the pelvis and adjacent to the liver. No free air. Musculoskeletal: No acute bony abnormality. IMPRESSION: Prior colectomy. Mid to distal small bowel loops are prominent with air-fluid levels. Difficult to completely exclude distal partial small bowel obstruction. Small amount of free fluid in the abdomen and pelvis. Electronically Signed   By: Rolm Baptise M.D.   On: 02/15/2021 23:39    Anti-infectives: Anti-infectives (From admission, onward)    None       Assessment/Plan: 20 year old woman with history of [name]/[name] who status post total colectomy/proctectomy with J-pouch and history of pouchitis and ileoanal anastomosis stenosis requiring dilation.  Presented with abdominal pain and obstructive symptoms, now having bowel movements and some improvement in abdominal pain.  Okay to try sips of clears today.    LOS: 1 day    Clovis Riley 02/17/2021

## 2021-02-17 NOTE — Progress Notes (Signed)
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  Patient noted to have undergoing anesthesia for excision of desmoid tumors on 02/14/2021 and noted following procedure and not had any bowel movement or flatus which was unusual as patient noted to have 5-6 bowel movements daily typically an hour after eating.  Patient presented worsening abdominal pain, nausea, vomiting and noted to be dehydrated.  CT scan done concerning for small bowel obstruction versus ileus.  Patient seen in the ED and attempts were made to transfer patient to [place] (patient general surgeon and gastroenterologist) but due to bed availability patient subsequently admitted to [facility], general surgery consulted for further evaluation.  Patient placed on bowel rest, IV fluids, pain management, supportive care.   Assessment & Plan:   Principal Problem:   SBO (small bowel obstruction) ([place])  #1 partial small bowel obstruction -Patient with history of [name]/[name] status post proctocolectomy with J-pouch June 2022 with diverting loop ileostomy reversal August 2022 presenting with nausea vomiting worsening abdominal pain with imaging findings concerning for distal partial small bowel obstruction. -Patient seen in consultation by general surgery. -Initial consultation per general surgery recommending transfer to [facility] for further care as soon as bed is available. -Patient noted to have had 4 bowel movements since  admission. -Patient still with abdominal pain however with slight improvement. -Patient being followed by general surgery and patient placed on sips of clears today. -General surgery following and appreciate input and recommendation.  2.  [name] -Outpatient follow-up.   DVT prophylaxis: SCDs Code Status: Full Family Communication: Updated patient and mother at bedside. Disposition:   Status is: Inpatient  Remains inpatient appropriate because: Severity of illness       Consultants:  General surgery: Dr. Windle Guard 02/16/2021  Procedures:  CT abdomen and pelvis 02/15/2021  Antimicrobials:  None   Subjective: Patient coming out of bathroom with mother.  Patient having flatus.  Patient noted to have had 4 bowel movements.  Patient with continued abdominal pain however slightly better than on admission.  No chest pain.  No shortness of breath.  Patient concerned she might be getting severely malnourished.  Objective: Vitals:   02/16/21 2144 02/17/21 0212 02/17/21 0616 02/17/21 0921  BP: (!) 106/54 (!) 106/48 (!) 105/49 (!) 109/52  Pulse: 67 (!) 55 (!) 55 (!) 58  Resp: 18 16 18 16   Temp: 98.4 F (36.9 C) 98.6 F (37 C) 97.8 F (36.6 C) 98.3 F (36.8 C)  TempSrc: Oral Oral Oral Oral  SpO2: 98% 99% 96% 100%  Weight:      Height:        Intake/Output Summary (Last 24 hours) at 02/17/2021 1229 Last data filed at 02/17/2021 0600 Gross per 24 hour  Intake 2137.58 ml  Output --  Net 2137.58 ml   Filed Weights   02/15/21 2015 02/15/21 2026  Weight: 53.1 kg 53.1 kg    Examination:  General exam: Appears calm and comfortable  Respiratory system: Clear to auscultation. Respiratory effort normal. Cardiovascular system: S1 &  S2 heard, RRR. No JVD, murmurs, rubs, gallops or clicks. No pedal edema. Gastrointestinal system: Abdomen is less distended, soft and some diffuse tenderness to palpation.  Positive bowel sounds.  No rebound.  No guarding.  Central nervous  system: Alert and oriented. No focal neurological deficits. Extremities: Symmetric 5 x 5 power. Skin: No rashes, lesions or ulcers Psychiatry: Judgement and insight appear normal. Mood & affect appropriate.     Data Reviewed: I have personally reviewed following labs and imaging studies  CBC: Recent Labs  Lab 02/15/21 2050 02/17/21 0452  WBC 13.0* 7.5  NEUTROABS 10.3*  --   HGB 14.3 11.3*  HCT 44.7 36.4  MCV 80.8 83.7  PLT 275 381    Basic Metabolic Panel: Recent Labs  Lab 02/15/21 2050 02/17/21 0452  NA 142 137  K 3.6 3.5  CL 106 106  CO2 26 23  GLUCOSE 105* 74  BUN 7 9  CREATININE 0.67 0.55  CALCIUM 10.1 8.6*  MG  --  2.2    GFR: Estimated Creatinine Clearance: 93.2 mL/min (by C-G formula based on SCr of 0.55 mg/dL).  Liver Function Tests: Recent Labs  Lab 02/15/21 2050 02/17/21 0452  AST 17 20  ALT 16 25  ALKPHOS 49 41  BILITOT 0.5 1.0  PROT 7.2 5.9*  ALBUMIN 4.5 3.4*    CBG: No results for input(s): GLUCAP in the last 168 hours.   Recent Results (from the past 240 hour(s))  Resp Panel by RT-PCR (Flu A&B, Covid) Nasopharyngeal Swab     Status: None   Collection Time: 02/16/21 12:15 AM   Specimen: Nasopharyngeal Swab; Nasopharyngeal(NP) swabs in vial transport medium  Result Value Ref Range Status   SARS Coronavirus 2 by RT PCR NEGATIVE NEGATIVE Final    Comment: (NOTE) SARS-CoV-2 target nucleic acids are NOT DETECTED.  The SARS-CoV-2 RNA is generally detectable in upper respiratory specimens during the acute phase of infection. The lowest concentration of SARS-CoV-2 viral copies this assay can detect is 138 copies/mL. A negative result does not preclude SARS-Cov-2 infection and should not be used as the sole basis for treatment or other patient management decisions. A negative result may occur with  improper specimen collection/handling, submission of specimen other than nasopharyngeal swab, presence of viral mutation(s) within the areas  targeted by this assay, and inadequate number of viral copies(<138 copies/mL). A negative result must be combined with clinical observations, patient history, and epidemiological information. The expected result is Negative.  Fact Sheet for Patients:  EntrepreneurPulse.com.au  Fact Sheet for Healthcare Providers:  IncredibleEmployment.be  This test is no t yet approved or cleared by the [place] FDA and  has been authorized for detection and/or diagnosis of SARS-CoV-2 by FDA under an Emergency Use Authorization (EUA). This EUA will remain  in effect (meaning this test can be used) for the duration of the COVID-19 declaration under Section 564(b)(1) of the Act, 21 U.S.C.section 360bbb-3(b)(1), unless the authorization is terminated  or revoked sooner.       Influenza A by PCR NEGATIVE NEGATIVE Final   Influenza B by PCR NEGATIVE NEGATIVE Final    Comment: (NOTE) The Xpert Xpress SARS-CoV-2/FLU/RSV plus assay is intended as an aid in the diagnosis of influenza from Nasopharyngeal swab specimens and should not be used as a sole basis for treatment. Nasal washings and aspirates are unacceptable for Xpert Xpress SARS-CoV-2/FLU/RSV testing.  Fact Sheet for Patients: EntrepreneurPulse.com.au  Fact Sheet for Healthcare Providers: IncredibleEmployment.be  This test is not yet approved or  cleared by the [place] and has been authorized for detection and/or diagnosis of SARS-CoV-2 by FDA under an Emergency Use Authorization (EUA). This EUA will remain in effect (meaning this test can be used) for the duration of the COVID-19 declaration under Section 564(b)(1) of the Act, 21 U.S.C. section 360bbb-3(b)(1), unless the authorization is terminated or revoked.  Performed at [facility], [address] [place],  [address]          Radiology Studies: CT ABDOMEN PELVIS W  CONTRAST  Result Date: 02/15/2021 CLINICAL DATA:  Abdominal pain, nausea, vomiting EXAM: CT ABDOMEN AND PELVIS WITH CONTRAST TECHNIQUE: Multidetector CT imaging of the abdomen and pelvis was performed using the standard protocol following bolus administration of intravenous contrast. CONTRAST:  80mL OMNIPAQUE IOHEXOL 300 MG/ML  SOLN COMPARISON:  None. FINDINGS: Lower chest: No acute abnormality. Hepatobiliary: Scattered subcentimeter cysts. No suspicious focal hepatic abnormality. Focal fatty infiltration adjacent to the falciform ligament. Gallbladder unremarkable. Pancreas: No focal abnormality or ductal dilatation. Spleen: No focal abnormality.  Normal size. Adrenals/Urinary Tract: 9 mm cyst in the midpole of the left kidney. No hydronephrosis. Adrenal glands and urinary bladder unremarkable. Stomach/Bowel: Prior colectomy. Mid to distal small bowel loops are mildly dilated and fluid-filled with scattered air-fluid levels. Proximal small bowel and stomach decompressed. Is difficult to exclude partial small bowel obstruction. Vascular/Lymphatic: No evidence of aneurysm or adenopathy. Reproductive: Uterus and adnexa unremarkable.  No mass. Other: Small amount of free fluid in the pelvis and adjacent to the liver. No free air. Musculoskeletal: No acute bony abnormality. IMPRESSION: Prior colectomy. Mid to distal small bowel loops are prominent with air-fluid levels. Difficult to completely exclude distal partial small bowel obstruction. Small amount of free fluid in the abdomen and pelvis. Electronically Signed   By: Rolm Baptise M.D.   On: 02/15/2021 23:39        Scheduled Meds:  liver oil-zinc oxide   Topical TID   pantoprazole (PROTONIX) IV  40 mg Intravenous Q24H   Continuous Infusions:  sodium chloride 125 mL/hr at 02/17/21 9233   potassium chloride 10 mEq (02/17/21 1221)     LOS: 1 day    Time spent: 40 minutes    Irine Seal, MD [facility]   To contact the attending  provider between 7A-7P or the covering provider during after hours 7P-7A, please log into the web site [email or web address] and access using universal [place] password for that web site. If you do not have the password, please call the hospital operator.  02/17/2021, 12:29 PM

## 2021-02-18 LAB — CBC
HCT: 36.3 % (ref 36.0–46.0)
Hemoglobin: 11.8 g/dL — ABNORMAL LOW (ref 12.0–15.0)
MCH: 26.8 pg (ref 26.0–34.0)
MCHC: 32.5 g/dL (ref 30.0–36.0)
MCV: 82.3 fL (ref 80.0–100.0)
Platelets: 232 10*3/uL (ref 150–400)
RBC: 4.41 MIL/uL (ref 3.87–5.11)
RDW: 12.6 % (ref 11.5–15.5)
WBC: 6.9 10*3/uL (ref 4.0–10.5)
nRBC: 0 % (ref 0.0–0.2)

## 2021-02-18 LAB — RENAL FUNCTION PANEL
Albumin: 3.5 g/dL (ref 3.5–5.0)
Anion gap: 8 (ref 5–15)
BUN: 8 mg/dL (ref 6–20)
CO2: 22 mmol/L (ref 22–32)
Calcium: 8.6 mg/dL — ABNORMAL LOW (ref 8.9–10.3)
Chloride: 105 mmol/L (ref 98–111)
Creatinine, Ser: 0.55 mg/dL (ref 0.44–1.00)
GFR, Estimated: >60 mL/min (ref >60)
Glucose, Bld: 81 mg/dL (ref 70–99)
Phosphorus: 2.4 mg/dL — ABNORMAL LOW (ref 2.5–4.6)
Potassium: 3.7 mmol/L (ref 3.5–5.1)
Sodium: 135 mmol/L (ref 135–145)

## 2021-02-18 LAB — MAGNESIUM: Magnesium: 2 mg/dL (ref 1.7–2.4)

## 2021-02-18 MED ORDER — LIDOCAINE VISCOUS HCL 2 % MT SOLN
15.0000 mL | Freq: Once | OROMUCOSAL | Status: AC
  Administered 2021-02-18: 15 mL via ORAL
  Filled 2021-02-18: qty 15

## 2021-02-18 MED ORDER — ALUM & MAG HYDROXIDE-SIMETH 200-200-20 MG/5ML PO SUSP
30.0000 mL | Freq: Once | ORAL | Status: AC
  Administered 2021-02-18: 30 mL via ORAL
  Filled 2021-02-18: qty 30

## 2021-02-18 MED ORDER — HYDROCORTISONE (PERIANAL) 2.5 % EX CREA
TOPICAL_CREAM | Freq: Three times a day (TID) | CUTANEOUS | Status: DC
  Administered 2021-02-24 – 2021-03-02 (×2): 1 via RECTAL
  Filled 2021-02-18: qty 28.35

## 2021-02-18 MED ORDER — MIRTAZAPINE 15 MG PO TABS
7.5000 mg | ORAL_TABLET | Freq: Every day | ORAL | Status: DC
  Administered 2021-02-18 – 2021-03-01 (×10): 7.5 mg via ORAL
  Filled 2021-02-18 (×12): qty 1

## 2021-02-18 MED ORDER — HYDROMORPHONE HCL 1 MG/ML IJ SOLN
0.5000 mg | INTRAMUSCULAR | Status: DC | PRN
Start: 2021-02-18 — End: 2021-03-02
  Administered 2021-02-18 – 2021-03-02 (×56): 1 mg via INTRAVENOUS
  Filled 2021-02-18 (×57): qty 1

## 2021-02-18 MED ORDER — ENSURE ENLIVE PO LIQD
237.0000 mL | Freq: Three times a day (TID) | ORAL | Status: DC
  Administered 2021-02-18 – 2021-03-02 (×9): 237 mL via ORAL

## 2021-02-18 MED ORDER — TRAMADOL HCL 50 MG PO TABS
50.0000 mg | ORAL_TABLET | Freq: Three times a day (TID) | ORAL | Status: DC | PRN
  Administered 2021-02-22: 50 mg via ORAL
  Filled 2021-02-18 (×2): qty 1

## 2021-02-18 NOTE — Progress Notes (Signed)
  Transition of Care (TOC) Screening Note   Patient Details  Name: Lisa Donovan Date of Birth: 2000/10/02   Transition of Care [facility]) CM/SW Contact:    Lennart Pall, LCSW Phone Number: 02/18/2021, 1:25 PM    Transition of Care Department [facility]) has reviewed patient and no TOC needs have been identified at this time. We will continue to monitor patient advancement through interdisciplinary progression rounds. If new patient transition needs arise, please place a TOC consult.    Sayge Salvato, LCSW

## 2021-02-18 NOTE — Progress Notes (Addendum)
   Subjective/Chief Complaint: Ongoing flatus and BMs. Nausea is back to her baseline. Feels her distention is better. Still having intermittent, cramping, lower abd pains.   Confirms her history of TAC 08/2020, followed by ileostomy reversal and creation of J-pouch 10/2020. Had ileoanal anastomosis dilated once in-office by her surgeon and then again during [facility] 11/16.   Objective: Vital signs in last 24 hours: Temp:  [97.8 F (36.6 C)-98.9 F (37.2 C)] 98.7 F (37.1 C) (12/12 0524) Pulse Rate:  [57-70] 65 (12/12 0524) Resp:  [14-16] 14 (12/12 0524) BP: (108-124)/(56-66) 108/66 (12/12 0524) SpO2:  [99 %-100 %] 99 % (12/12 0524) Last BM Date: 02/17/21  Intake/Output from previous day: 12/11 0701 - 12/12 0700 In: 2627.2 [P.O.:120; I.V.:2103.3; IV Piggyback:403.9] Out: -  Intake/Output this shift: No intake/output data recorded.  Alert, no distress Unlabored respiration Abdomen is soft, mild distention, mild subjective lower abdominal tenderness, no peritonitis, RLQ scar from ileostomy reversal  No extremity edema  Lab Results:  Recent Labs    02/17/21 0452 02/18/21 0415  WBC 7.5 6.9  HGB 11.3* 11.8*  HCT 36.4 36.3  PLT 199 232   BMET Recent Labs    02/17/21 0452 02/18/21 0415  NA 137 135  K 3.5 3.7  CL 106 105  CO2 23 22  GLUCOSE 74 81  BUN 9 8  CREATININE 0.55 0.55  CALCIUM 8.6* 8.6*   PT/INR No results for input(s): LABPROT, INR in the last 72 hours. ABG No results for input(s): PHART, HCO3 in the last 72 hours.  Invalid input(s): PCO2, PO2  Studies/Results: No results found.  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  Presented with abdominal pain and obstructive symptoms - now having bowel function,  interval improvement in abd distention, tolerating sips/chips - advance to FLD/ensure and monitor  - no acute surgical needs today. Recommended patient be transferred to [facility] where her specialists are. Ongoing signs of obstruction at the level of her ileoanal anastomosis (like failing to tolerate enough PO, recurrent distention, nausea, vomiting, or cessation of flatus/BMs) may warrant exam under anesthesia and possible dilation of ileoanal anastomosis.  - resume home PO tramadol and mirtazapine    LOS: 2 days    Jill Alexanders 02/18/2021

## 2021-02-18 NOTE — Progress Notes (Signed)
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  Patient noted to have undergoing anesthesia for excision of desmoid tumors on 02/14/2021 and noted following procedure and not had any bowel movement or flatus which was unusual as patient noted to have 5-6 bowel movements daily typically an hour after eating.  Patient presented worsening abdominal pain, nausea, vomiting and noted to be dehydrated.  CT scan done concerning for small bowel obstruction versus ileus.  Patient seen in the ED and attempts were made to transfer patient to [facility] (patient general surgeon and gastroenterologist) but due to bed availability patient subsequently admitted to [facility], general surgery consulted for further evaluation.  Patient placed on bowel rest, IV fluids, pain management, supportive care.   Assessment & Plan:   Principal Problem:   SBO (small bowel obstruction) ([place])  #1 partial small bowel obstruction -Patient with history of [name]/[name] status post proctocolectomy with J-pouch June 2022 with diverting loop ileostomy reversal August 2022 presenting with nausea vomiting worsening abdominal pain with imaging findings concerning for distal partial small bowel obstruction. -Patient seen in consultation by general surgery. -Initial consultation per general surgery recommending transfer to [facility] for further care as soon as bed is available. -Patient noted to have had 4 bowel movements on 02/16/2021,  patient noted to have a bowel movement also on 02/17/2021 with some clinical improvement.   -Patient still with abdominal pain however with slight improvement. -Patient being followed by general surgery and diet advanced to a full liquid diet per general surgery, general surgery recommending to resume home dose oral tramadol and mirtazapine.   -Hopefully per general surgery if patient continues to improve and get medically better she can follow-up in the outpatient setting with her specialist.  -General surgery following and appreciate input and recommendation.  2.  [name] -Outpatient follow-up.   DVT prophylaxis: SCDs Code Status: Full Family Communication: Updated patient and mother at bedside. Disposition:   Status is: Inpatient  Remains inpatient appropriate because: Severity of illness       Consultants:  General surgery: Dr. Windle Guard 02/16/2021  Procedures:  CT abdomen and pelvis 02/15/2021  Antimicrobials:  None   Subjective: Patient sitting up in bed.  Complaining of some abdominal spasms.  Had bowel movements overnight.  Tolerated clears.  No chest pain.  No shortness of breath.  Diet has been advanced to full liquid diet and patient currently eating grits.  Mother at bedside.    Objective: Vitals:   02/17/21 1349 02/17/21 2143 02/18/21 0125 02/18/21 0524  BP: (!) 116/56 (!) 124/58 116/60 108/66  Pulse: 68 70 (!) 57 65  Resp: 16 16 14 14   Temp: 97.8 F (36.6 C) 98.9 F (37.2 C) 98.1 F (36.7 C) 98.7 F (37.1 C)  TempSrc: Oral Oral Oral Oral  SpO2: 100% 99% 99% 99%  Weight:      Height:        Intake/Output Summary (Last 24 hours) at 02/18/2021 1108 Last data filed at 02/18/2021 0600 Gross per 24 hour  Intake 2627.19 ml  Output --  Net 2627.19 ml    Filed Weights   02/15/21 2015 02/15/21 2026  Weight: 53.1 kg 53.1 kg    Examination:  General exam: NAD Respiratory system: CTA B.  No wheezes, no crackles, no rhonchi.  Normal respiratory  effort.  Speaking in full sentences.  Cardiovascular system: Regular rate and rhythm no murmurs rubs or gallops.  No JVD.  No lower extremity edema.  Gastrointestinal system: Abdomen less distended on the left greater than right.  Abdomen is soft, positive bowel sounds.  Some diffuse tenderness to palpation.  No rebound.  No guarding. Central nervous system: Alert and oriented. No focal neurological deficits. Extremities: Symmetric 5 x 5 power. Skin: No rashes, lesions or ulcers Psychiatry: Judgement and insight appear normal. Mood & affect appropriate.     Data Reviewed: I have personally reviewed following labs and imaging studies  CBC: Recent Labs  Lab 02/15/21 2050 02/17/21 0452 02/18/21 0415  WBC 13.0* 7.5 6.9  NEUTROABS 10.3*  --   --   HGB 14.3 11.3* 11.8*  HCT 44.7 36.4 36.3  MCV 80.8 83.7 82.3  PLT 275 199 232     Basic Metabolic Panel: Recent Labs  Lab 02/15/21 2050 02/17/21 0452 02/18/21 0415  NA 142 137 135  K 3.6 3.5 3.7  CL 106 106 105  CO2 26 23 22   GLUCOSE 105* 74 81  BUN 7 9 8   CREATININE 0.67 0.55 0.55  CALCIUM 10.1 8.6* 8.6*  MG  --  2.2 2.0  PHOS  --   --  2.4*     GFR: Estimated Creatinine Clearance: 93.2 mL/min (by C-G formula based on SCr of 0.55 mg/dL).  Liver Function Tests: Recent Labs  Lab 02/15/21 2050 02/17/21 0452 02/18/21 0415  AST 17 20  --   ALT 16 25  --   ALKPHOS 49 41  --   BILITOT 0.5 1.0  --   PROT 7.2 5.9*  --   ALBUMIN 4.5 3.4* 3.5     CBG: No results for input(s): GLUCAP in the last 168 hours.   Recent Results (from the past 240 hour(s))  Resp Panel by RT-PCR (Flu A&B, Covid) Nasopharyngeal Swab     Status: None   Collection Time: 02/16/21 12:15 AM   Specimen: Nasopharyngeal Swab; Nasopharyngeal(NP) swabs in vial transport medium  Result Value Ref Range Status   SARS Coronavirus 2 by RT PCR NEGATIVE NEGATIVE Final    Comment: (NOTE) SARS-CoV-2 target nucleic acids are NOT DETECTED.  The SARS-CoV-2  RNA is generally detectable in upper respiratory specimens during the acute phase of infection. The lowest concentration of SARS-CoV-2 viral copies this assay can detect is 138 copies/mL. A negative result does not preclude SARS-Cov-2 infection and should not be used as the sole basis for treatment or other patient management decisions. A negative result may occur with  improper specimen collection/handling, submission of specimen other than nasopharyngeal swab, presence of viral mutation(s) within the areas targeted by this assay, and inadequate number of viral copies(<138 copies/mL). A negative result must be combined with clinical observations, patient history, and epidemiological information. The expected result is Negative.  Fact Sheet for Patients:  EntrepreneurPulse.com.au  Fact Sheet for Healthcare Providers:  IncredibleEmployment.be  This test is no t yet approved or cleared by the [place] FDA and  has been authorized for detection and/or diagnosis of SARS-CoV-2 by FDA under an Emergency Use Authorization (EUA). This EUA will remain  in effect (meaning  this test can be used) for the duration of the COVID-19 declaration under Section 564(b)(1) of the Act, 21 U.S.C.section 360bbb-3(b)(1), unless the authorization is terminated  or revoked sooner.       Influenza A by PCR NEGATIVE NEGATIVE Final   Influenza B by PCR NEGATIVE NEGATIVE Final    Comment: (NOTE) The Xpert Xpress SARS-CoV-2/FLU/RSV plus assay is intended as an aid in the diagnosis of influenza from Nasopharyngeal swab specimens and should not be used as a sole basis for treatment. Nasal washings and aspirates are unacceptable for Xpert Xpress SARS-CoV-2/FLU/RSV testing.  Fact Sheet for Patients: EntrepreneurPulse.com.au  Fact Sheet for Healthcare Providers: IncredibleEmployment.be  This test is not yet approved or cleared by the  [place] FDA and has been authorized for detection and/or diagnosis of SARS-CoV-2 by FDA under an Emergency Use Authorization (EUA). This EUA will remain in effect (meaning this test can be used) for the duration of the COVID-19 declaration under Section 564(b)(1) of the Act, 21 U.S.C. section 360bbb-3(b)(1), unless the authorization is terminated or revoked.  Performed at [facility], [address] [place], [place] [address]           Radiology Studies: No results found.      Scheduled Meds:  feeding supplement  237 mL Oral TID WC   liver oil-zinc oxide   Topical TID   mirtazapine  7.5 mg Oral QHS   pantoprazole (PROTONIX) IV  40 mg Intravenous Q24H   Continuous Infusions:  sodium chloride 125 mL/hr at 02/18/21 0927     LOS: 2 days    Time spent: 40 minutes    Irine Seal, MD [facility]   To contact the attending provider between 7A-7P or the covering provider during after hours 7P-7A, please log into the web site [email or web address] and access using universal [place] password for that web site. If you do not have the password, please call the hospital operator.  02/18/2021, 11:08 AM

## 2021-02-19 LAB — BASIC METABOLIC PANEL
Anion gap: 9 (ref 5–15)
BUN: 7 mg/dL (ref 6–20)
CO2: 23 mmol/L (ref 22–32)
Calcium: 8.9 mg/dL (ref 8.9–10.3)
Chloride: 105 mmol/L (ref 98–111)
Creatinine, Ser: 0.58 mg/dL (ref 0.44–1.00)
GFR, Estimated: >60 mL/min (ref >60)
Glucose, Bld: 91 mg/dL (ref 70–99)
Potassium: 3.7 mmol/L (ref 3.5–5.1)
Sodium: 137 mmol/L (ref 135–145)

## 2021-02-19 LAB — MAGNESIUM: Magnesium: 2.1 mg/dL (ref 1.7–2.4)

## 2021-02-19 LAB — PREALBUMIN: Prealbumin: 19.2 mg/dL (ref 18–38)

## 2021-02-19 MED ORDER — LIDOCAINE VISCOUS HCL 2 % MT SOLN
15.0000 mL | Freq: Four times a day (QID) | OROMUCOSAL | Status: DC | PRN
  Filled 2021-02-19: qty 15

## 2021-02-19 MED ORDER — ALUM & MAG HYDROXIDE-SIMETH 200-200-20 MG/5ML PO SUSP
30.0000 mL | Freq: Four times a day (QID) | ORAL | Status: DC | PRN
  Administered 2021-02-23 (×2): 30 mL via ORAL
  Filled 2021-02-19 (×2): qty 30

## 2021-02-19 MED ORDER — PANTOPRAZOLE SODIUM 40 MG IV SOLR
40.0000 mg | Freq: Two times a day (BID) | INTRAVENOUS | Status: DC
  Administered 2021-02-19 – 2021-02-26 (×15): 40 mg via INTRAVENOUS
  Filled 2021-02-19 (×15): qty 40

## 2021-02-19 MED ORDER — SIMETHICONE 80 MG PO CHEW
80.0000 mg | CHEWABLE_TABLET | Freq: Four times a day (QID) | ORAL | Status: AC
  Administered 2021-02-19 – 2021-02-22 (×11): 80 mg via ORAL
  Filled 2021-02-19 (×10): qty 1

## 2021-02-19 NOTE — Progress Notes (Signed)
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  Patient noted to have undergoing anesthesia for excision of desmoid tumors on 02/14/2021 and noted following procedure and not had any bowel movement or flatus which was unusual as patient noted to have 5-6 bowel movements daily typically an hour after eating.  Patient presented worsening abdominal pain, nausea, vomiting and noted to be dehydrated.  CT scan done concerning for small bowel obstruction versus ileus.  Patient seen in the ED and attempts were made to transfer patient to [facility] (patient general surgeon and gastroenterologist) but due to bed availability patient subsequently admitted to [facility], general surgery consulted for further evaluation.  Patient placed on bowel rest, IV fluids, pain management, supportive care.   Assessment & Plan:   Principal Problem:   SBO (small bowel obstruction) ([place])  #1 partial small bowel obstruction -Patient with history of [name]/[name] status post proctocolectomy with J-pouch June 2022 with diverting loop ileostomy reversal August 2022 presenting with nausea vomiting worsening abdominal pain with imaging findings concerning for distal partial small bowel obstruction. -Patient seen in consultation by general surgery. -Initial consultation per general surgery recommending transfer to [facility] for further care as soon as bed is available. -Patient noted to have had 4 bowel movements on 02/16/2021,  patient noted to have a bowel movement also on 02/17/2021 with some clinical improvement.   -Patient with ongoing explosive bowel movements yesterday and ongoing bowel movements today. -Patient with complaints of abdominal pain and rectal pain with bowel movements that feel like burning sensation/reflux. -Patient with some intermittent abdominal cramping with slight improvement done on admission. -Patient being followed by general surgery and diet advanced to soft diet today per general surgery.   -Home regimen of tramadol and mirtazapine resumed per general surgery.  -General surgery noted to try to get in contact with patient's surgeon at [facility]. -Hopefully per general surgery if patient continues to improve and get medically better she can follow-up in the outpatient setting with her specialist.  -General surgery following and appreciate input and recommendation.  2.  [name] -Outpatient follow-up.   DVT prophylaxis: SCDs Code Status: Full Family Communication: Updated patient and mother at bedside. Disposition:   Status is: Inpatient  Remains inpatient appropriate because: Severity of illness       Consultants:  General surgery: Dr. Windle Guard 02/16/2021  Procedures:  CT abdomen and pelvis 02/15/2021  Antimicrobials:  None   Subjective: Patient complaining of burning in the abdomen and rectal region associated with bowel movements.  States pain similar to reflux.  Some intermittent lower abdominal cramping ongoing but not as severe as on admission.  Tolerated clear liquids.  Having multiple explosive bowel movements.  Some nausea.  Stated unable to tolerate full liquids and diet advanced to a soft diet per general surgery today.  Mother states general surgery stated they were going to try to contact patient's surgeons at [facility].  Objective: Vitals:   02/18/21 0524 02/18/21 1407 02/18/21 2130 02/19/21 0501  BP: 108/66 108/62 126/79  127/70  Pulse: 65 72 71 (!) 58  Resp: 14  16 16 18   Temp: 98.7 F (37.1 C) 97.8 F (36.6 C) 98.6 F (37 C) 97.9 F (36.6 C)  TempSrc: Oral Oral Oral   SpO2: 99% 98% 99% 97%  Weight:      Height:        Intake/Output Summary (Last 24 hours) at 02/19/2021 1241 Last data filed at 02/19/2021 0951 Gross per 24 hour  Intake 3467.28 ml  Output 0 ml  Net 3467.28 ml    Filed Weights   02/15/21 2015 02/15/21 2026  Weight: 53.1 kg 53.1 kg    Examination:  General exam: NAD. Respiratory system: Lungs clear to auscultation bilaterally.  No wheezes, no crackles, no rhonchi.  Normal respiratory effort.  Speaking in full sentences.   Cardiovascular system: RRR no murmurs rubs or gallops.  No JVD.  No lower extremity edema.  Gastrointestinal system: Abdomen soft, some lower abdominal distention noted, soft, positive bowel sounds.  Some diffuse tenderness to palpation.  No rebound.  No guarding.  Central nervous system: Alert and oriented. No focal neurological deficits. Extremities: Symmetric 5 x 5 power. Skin: No rashes, lesions or ulcers Psychiatry: Judgement and insight appear normal. Mood & affect appropriate.     Data Reviewed: I have personally reviewed following labs and imaging studies  CBC: Recent Labs  Lab 02/15/21 2050 02/17/21 0452 02/18/21 0415  WBC 13.0* 7.5 6.9  NEUTROABS 10.3*  --   --   HGB 14.3 11.3* 11.8*  HCT 44.7 36.4 36.3  MCV 80.8 83.7 82.3  PLT 275 199 232     Basic Metabolic Panel: Recent Labs  Lab 02/15/21 2050 02/17/21 0452 02/18/21 0415 02/19/21 0439  NA 142 137 135 137  K 3.6 3.5 3.7 3.7  CL 106 106 105 105  CO2 26 23 22 23   GLUCOSE 105* 74 81 91  BUN 7 9 8 7   CREATININE 0.67 0.55 0.55 0.58  CALCIUM 10.1 8.6* 8.6* 8.9  MG  --  2.2 2.0 2.1  PHOS  --   --  2.4*  --      GFR: Estimated Creatinine Clearance: 93.2 mL/min (by C-G formula based on SCr of 0.58 mg/dL).  Liver Function Tests: Recent Labs  Lab 02/15/21 2050 02/17/21 0452 02/18/21 0415  AST 17 20  --   ALT  16 25  --   ALKPHOS 49 41  --   BILITOT 0.5 1.0  --   PROT 7.2 5.9*  --   ALBUMIN 4.5 3.4* 3.5     CBG: No results for input(s): GLUCAP in the last 168 hours.   Recent Results (from the past 240 hour(s))  Resp Panel by RT-PCR (Flu A&B, Covid) Nasopharyngeal Swab     Status: None   Collection Time: 02/16/21 12:15 AM   Specimen: Nasopharyngeal Swab; Nasopharyngeal(NP) swabs in vial transport medium  Result Value Ref Range Status   SARS Coronavirus 2 by RT PCR NEGATIVE NEGATIVE Final    Comment: (NOTE) SARS-CoV-2 target nucleic acids are NOT DETECTED.  The SARS-CoV-2 RNA is generally detectable in upper respiratory specimens during the acute phase of infection. The lowest concentration of SARS-CoV-2 viral copies this assay can detect is 138 copies/mL. A negative result does not preclude SARS-Cov-2 infection and should not be used as the sole basis for treatment or other patient management decisions. A negative result may occur with  improper specimen collection/handling, submission of specimen other than nasopharyngeal swab, presence of viral mutation(s)  within the areas targeted by this assay, and inadequate number of viral copies(<138 copies/mL). A negative result must be combined with clinical observations, patient history, and epidemiological information. The expected result is Negative.  Fact Sheet for Patients:  EntrepreneurPulse.com.au  Fact Sheet for Healthcare Providers:  IncredibleEmployment.be  This test is no t yet approved or cleared by the [place] FDA and  has been authorized for detection and/or diagnosis of SARS-CoV-2 by FDA under an Emergency Use Authorization (EUA). This EUA will remain  in effect (meaning this test can be used) for the duration of the COVID-19 declaration under Section 564(b)(1) of the Act, 21 U.S.C.section 360bbb-3(b)(1), unless the authorization is terminated  or revoked sooner.        Influenza A by PCR NEGATIVE NEGATIVE Final   Influenza B by PCR NEGATIVE NEGATIVE Final    Comment: (NOTE) The Xpert Xpress SARS-CoV-2/FLU/RSV plus assay is intended as an aid in the diagnosis of influenza from Nasopharyngeal swab specimens and should not be used as a sole basis for treatment. Nasal washings and aspirates are unacceptable for Xpert Xpress SARS-CoV-2/FLU/RSV testing.  Fact Sheet for Patients: EntrepreneurPulse.com.au  Fact Sheet for Healthcare Providers: IncredibleEmployment.be  This test is not yet approved or cleared by the [place] FDA and has been authorized for detection and/or diagnosis of SARS-CoV-2 by FDA under an Emergency Use Authorization (EUA). This EUA will remain in effect (meaning this test can be used) for the duration of the COVID-19 declaration under Section 564(b)(1) of the Act, 21 U.S.C. section 360bbb-3(b)(1), unless the authorization is terminated or revoked.  Performed at [facility], [address] [place], [place] [address]           Radiology Studies: No results found.      Scheduled Meds:  feeding supplement  237 mL Oral TID WC   hydrocortisone   Rectal TID   liver oil-zinc oxide   Topical TID   mirtazapine  7.5 mg Oral QHS   pantoprazole (PROTONIX) IV  40 mg Intravenous Q12H   simethicone  80 mg Oral QID   Continuous Infusions:  sodium chloride 125 mL/hr at 02/19/21 0737     LOS: 3 days    Time spent: 40 minutes    Irine Seal, MD [facility]   To contact the attending provider between 7A-7P or the covering provider during after hours 7P-7A, please log into the web site [email or web address] and access using universal [place] password for that web site. If you do not have the password, please call the hospital operator.  02/19/2021, 12:41 PM

## 2021-02-19 NOTE — Progress Notes (Addendum)
Subjective/Chief Complaint: Reports burning abdominal and rectal pain associated with bowel movements.  She equates the pain to the pain she had from acidic efflux from her stoma when she had a high output ileostomy.  Also continues to have intermittent cramping lower abdominal pain, not as bad as when she first came in.  Patient tolerated a small amount of full liquid diet yesterday but did not eat much.  Reports uncomfortable, explosive bowel movements.  Having mild nausea that is worse when her pain is not controlled.  Denies vomiting.  Objective: Vital signs in last 24 hours: Temp:  [97.8 F (36.6 C)-98.6 F (37 C)] 97.9 F (36.6 C) (12/13 0501) Pulse Rate:  [58-72] 58 (12/13 0501) Resp:  [16-18] 18 (12/13 0501) BP: (108-127)/(62-79) 127/70 (12/13 0501) SpO2:  [97 %-99 %] 97 % (12/13 0501) Last BM Date: 02/19/21  Intake/Output from previous day: 12/12 0701 - 12/13 0700 In: 4045.9 [P.O.:560; I.V.:3485.9] Out: 0  Intake/Output this shift: No intake/output data recorded.  Alert, no distress Unlabored respirations Abdomen is soft, mild distention of the lower abdomen, mild lower abdominal tenderness, no peritonitis, RLQ scar from ileostomy reversal  No extremity edema  Lab Results:  Recent Labs    02/17/21 0452 02/18/21 0415  WBC 7.5 6.9  HGB 11.3* 11.8*  HCT 36.4 36.3  PLT 199 232   BMET Recent Labs    02/18/21 0415 02/19/21 0439  NA 135 137  K 3.7 3.7  CL 105 105  CO2 22 23  GLUCOSE 81 91  BUN 8 7  CREATININE 0.55 0.58  CALCIUM 8.6* 8.9   PT/INR No results for input(s): LABPROT, INR in the last 72 hours. ABG No results for input(s): PHART, HCO3 in the last 72 hours.  Invalid input(s): PCO2, PO2  Studies/Results: No results found.  Anti-infectives: Anti-infectives (From admission, onward)    None       Assessment/Plan: 20 year old woman with history of [name]/[name] who status post total colectomy/proctectomy with J-pouch and history  of pouchitis and ileoanal anastomosis stenosis requiring dilation.  Presented with abdominal pain and obstructive symptoms - now having bowel function, interval improvement in abd distentionn - advance to SOFT/ensure and monitor - no acute surgical needs today, will continue to try to increase oral intake.  She has ongoing symptoms attributable to partial bowel obstruction at the level of her ileoanal anastomosis.  Her history of uncomfortable, forceful bowel movements after p.o. intake are consistent with possible restricturing at her anastomosis.  It is also possible that some of her symptoms are related to ongoing issues with her J-pouch itself.  Again, I think this patient would be best served at [facility] under the care of her colorectal surgeons and GI J-pouch specialist. Ongoing signs of obstruction at the level of her ileoanal anastomosis (like failing to tolerate enough PO, recurrent distention, nausea, vomiting, or cessation of flatus/BMs) may warrant exam under anesthesia and possible dilation of ileoanal anastomosis.     LOS: 3 days    Jill Alexanders 02/19/2021   I personally saw the patient and performed a substantive portion of this encounter, including a complete performance of at least one of the key components (MDM, Hx and/or Exam), in conjunction with the Advanced Practice Provider Obie Dredge, PA-C.         Will reach out to her surgeon   Not sure much else to offer but pouch is emptying at this point and she seems to have some underlying motility issues which are not  new  EUA could be considered but again not sure its much value   May eventually need transfer since pouch care not a part of [facility] routine practice

## 2021-02-20 LAB — CBC WITH DIFFERENTIAL/PLATELET
Abs Immature Granulocytes: 0.02 10*3/uL (ref 0.00–0.07)
Basophils Absolute: 0 10*3/uL (ref 0.0–0.1)
Basophils Relative: 1 %
Eosinophils Absolute: 0.1 10*3/uL (ref 0.0–0.5)
Eosinophils Relative: 1 %
HCT: 37.1 % (ref 36.0–46.0)
Hemoglobin: 12.1 g/dL (ref 12.0–15.0)
Immature Granulocytes: 0 %
Lymphocytes Relative: 34 %
Lymphs Abs: 2.4 10*3/uL (ref 0.7–4.0)
MCH: 26.4 pg (ref 26.0–34.0)
MCHC: 32.6 g/dL (ref 30.0–36.0)
MCV: 80.8 fL (ref 80.0–100.0)
Monocytes Absolute: 0.6 10*3/uL (ref 0.1–1.0)
Monocytes Relative: 9 %
Neutro Abs: 3.9 10*3/uL (ref 1.7–7.7)
Neutrophils Relative %: 55 %
Platelets: 242 10*3/uL (ref 150–400)
RBC: 4.59 MIL/uL (ref 3.87–5.11)
RDW: 13 % (ref 11.5–15.5)
WBC: 7 10*3/uL (ref 4.0–10.5)
nRBC: 0 % (ref 0.0–0.2)

## 2021-02-20 LAB — BASIC METABOLIC PANEL
Anion gap: 7 (ref 5–15)
BUN: 7 mg/dL (ref 6–20)
CO2: 24 mmol/L (ref 22–32)
Calcium: 8.6 mg/dL — ABNORMAL LOW (ref 8.9–10.3)
Chloride: 108 mmol/L (ref 98–111)
Creatinine, Ser: 0.52 mg/dL (ref 0.44–1.00)
GFR, Estimated: >60 mL/min (ref >60)
Glucose, Bld: 96 mg/dL (ref 70–99)
Potassium: 3.7 mmol/L (ref 3.5–5.1)
Sodium: 139 mmol/L (ref 135–145)

## 2021-02-20 MED ORDER — SODIUM CHLORIDE 0.9 % IV SOLN
2.0000 g | INTRAVENOUS | Status: AC
  Administered 2021-02-21: 2 g via INTRAVENOUS
  Filled 2021-02-20 (×2): qty 2

## 2021-02-20 MED ORDER — OXYCODONE HCL 5 MG PO TABS
5.0000 mg | ORAL_TABLET | ORAL | Status: DC | PRN
  Administered 2021-02-20 – 2021-03-02 (×31): 5 mg via ORAL
  Filled 2021-02-20 (×33): qty 1

## 2021-02-20 MED ORDER — DIPHENHYDRAMINE HCL 50 MG/ML IJ SOLN
12.5000 mg | Freq: Three times a day (TID) | INTRAMUSCULAR | Status: DC | PRN
  Administered 2021-02-20 – 2021-03-02 (×23): 12.5 mg via INTRAVENOUS
  Filled 2021-02-20 (×25): qty 1

## 2021-02-20 NOTE — Progress Notes (Signed)
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  Patient noted to have undergoing anesthesia for excision of desmoid tumors on 02/14/2021 and noted following procedure and not had any bowel movement or flatus which was unusual as patient noted to have 5-6 bowel movements daily typically an hour after eating.  Patient presented worsening abdominal pain, nausea, vomiting and noted to be dehydrated.  CT scan done concerning for small bowel obstruction versus ileus.  Patient seen in the ED and attempts were made to transfer patient to [facility] (patient general surgeon and gastroenterologist) but due to bed availability patient subsequently admitted to [facility], general surgery consulted for further evaluation.  Patient placed on bowel rest, IV fluids, pain management, supportive care.   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 -Patient seen in consultation by general surgery. -Patient with complaints of abdominal pain and rectal pain with bowel movements that feel like burning sensation/reflux. -Patient with some intermittent abdominal cramping with slight  improvement done on admission. -change pain regimen to PO oxy as well as IV dilaudid -Hopefully per general surgery if patient continues to improve and get medically better she can follow-up in the outpatient setting with her specialist.  -General surgery following and appreciate input and recommendation.  [name] -Outpatient follow-up.   DVT prophylaxis: SCDs Code Status: Full Family Communication: Updated patient and mother at bedside. Disposition:   Status is: Inpatient  Remains inpatient appropriate because: Severity of illness       Consultants:  General surgery: Dr. Windle Guard 02/16/2021  Procedures:  CT abdomen and pelvis 02/15/2021     Subjective: Pain and nausea keep her from eating more-- says in prior hospitalizations she get better with oxy/dilaudid combo -still says she does not feel like herself.  Objective: Vitals:   02/19/21 1319 02/19/21 2100 02/20/21 0510 02/20/21 0943  BP: (!) 103/56 100/60 (!) 109/56 (!) 114/59  Pulse: 71 70 64 (!) 52  Resp: 16 16 16 14   Temp: 97.7 F (36.5 C) 97.7 F (36.5 C) 98.1 F (36.7 C) 97.6 F (36.4 C)  TempSrc: Oral Oral Oral Oral  SpO2: 98% 98% 97% 100%  Weight:      Height:        Intake/Output Summary (Last 24 hours) at 02/20/2021 1347 Last data filed at 02/20/2021 1331 Gross per 24 hour  Intake 4150.15 ml  Output --  Net 4150.15 ml   Filed Weights   02/15/21 2015 02/15/21 2026  Weight: 53.1 kg 53.1 kg    Examination:   General: Appearance:    Thin female in no acute distress     Lungs:     respirations unlabored  Heart:    Bradycardic.   MS:   All extremities  are intact.  No hemorrhoids on exam, no skin breakdown  Neurologic:   Awake, alert, oriented x 3. No apparent focal neurological           defect.        Data Reviewed: I have personally reviewed following labs and imaging studies  CBC: Recent Labs  Lab 02/15/21 2050 02/17/21 0452 02/18/21 0415 02/20/21 0433  WBC 13.0* 7.5  6.9 7.0  NEUTROABS 10.3*  --   --  3.9  HGB 14.3 11.3* 11.8* 12.1  HCT 44.7 36.4 36.3 37.1  MCV 80.8 83.7 82.3 80.8  PLT 275 199 232 097    Basic Metabolic Panel: Recent Labs  Lab 02/15/21 2050 02/17/21 0452 02/18/21 0415 02/19/21 0439 02/20/21 0433  NA 142 137 135 137 139  K 3.6 3.5 3.7 3.7 3.7  CL 106 106 105 105 108  CO2 26 23 22 23 24   GLUCOSE 105* 74 81 91 96  BUN 7 9 8 7 7   CREATININE 0.67 0.55 0.55 0.58 0.52  CALCIUM 10.1 8.6* 8.6* 8.9 8.6*  MG  --  2.2 2.0 2.1  --   PHOS  --   --  2.4*  --   --     GFR: Estimated Creatinine Clearance: 93.2 mL/min (by C-G formula based on SCr of 0.52 mg/dL).  Liver Function Tests: Recent Labs  Lab 02/15/21 2050 02/17/21 0452 02/18/21 0415  AST 17 20  --   ALT 16 25  --   ALKPHOS 49 41  --   BILITOT 0.5 1.0  --   PROT 7.2 5.9*  --   ALBUMIN 4.5 3.4* 3.5    CBG: No results for input(s): GLUCAP in the last 168 hours.   Recent Results (from the past 240 hour(s))  Resp Panel by RT-PCR (Flu A&B, Covid) Nasopharyngeal Swab     Status: None   Collection Time: 02/16/21 12:15 AM   Specimen: Nasopharyngeal Swab; Nasopharyngeal(NP) swabs in vial transport medium  Result Value Ref Range Status   SARS Coronavirus 2 by RT PCR NEGATIVE NEGATIVE Final    Comment: (NOTE) SARS-CoV-2 target nucleic acids are NOT DETECTED.  The SARS-CoV-2 RNA is generally detectable in upper respiratory specimens during the acute phase of infection. The lowest concentration of SARS-CoV-2 viral copies this assay can detect is 138 copies/mL. A negative result does not preclude SARS-Cov-2 infection and should not be used as the sole basis for treatment or other patient management decisions. A negative result may occur with  improper specimen collection/handling, submission of specimen other than nasopharyngeal swab, presence of viral mutation(s) within the areas targeted by this assay, and inadequate number of viral copies(<138 copies/mL). A negative  result must be combined with clinical observations, patient history, and epidemiological information. The expected result is Negative.  Fact Sheet for Patients:  EntrepreneurPulse.com.au  Fact Sheet for Healthcare Providers:  IncredibleEmployment.be  This test is no t yet approved or cleared by the [place] FDA and  has been authorized for detection and/or diagnosis of SARS-CoV-2 by FDA under an Emergency Use Authorization (EUA). This EUA will remain  in effect (meaning this test can be used) for the duration of the COVID-19 declaration under Section 564(b)(1) of the Act, 21 U.S.C.section 360bbb-3(b)(1), unless the authorization is terminated  or revoked sooner.       Influenza A by PCR NEGATIVE NEGATIVE Final   Influenza B by PCR NEGATIVE NEGATIVE Final    Comment: (NOTE) The Xpert Xpress SARS-CoV-2/FLU/RSV plus assay is intended as  an aid in the diagnosis of influenza from Nasopharyngeal swab specimens and should not be used as a sole basis for treatment. Nasal washings and aspirates are unacceptable for Xpert Xpress SARS-CoV-2/FLU/RSV testing.  Fact Sheet for Patients: EntrepreneurPulse.com.au  Fact Sheet for Healthcare Providers: IncredibleEmployment.be  This test is not yet approved or cleared by the [place] FDA and has been authorized for detection and/or diagnosis of SARS-CoV-2 by FDA under an Emergency Use Authorization (EUA). This EUA will remain in effect (meaning this test can be used) for the duration of the COVID-19 declaration under Section 564(b)(1) of the Act, 21 U.S.C. section 360bbb-3(b)(1), unless the authorization is terminated or revoked.  Performed at [facility], [address] [place], [place] [address]           Radiology Studies: No results found.      Scheduled Meds:  feeding supplement  237 mL Oral TID WC   hydrocortisone    Rectal TID   liver oil-zinc oxide   Topical TID   mirtazapine  7.5 mg Oral QHS   pantoprazole (PROTONIX) IV  40 mg Intravenous Q12H   simethicone  80 mg Oral QID   Continuous Infusions:  sodium chloride 125 mL/hr at 02/20/21 1241     LOS: 4 days    Time spent: 40 minutes    Geradine Girt, DO [facility]   To contact the attending provider between 7A-7P or the covering provider during after hours 7P-7A, please log into the web site [email or web address] and access using universal [place] password for that web site. If you do not have the password, please call the hospital operator.  02/20/2021, 1:47 PM

## 2021-02-20 NOTE — H&P (View-Only) (Signed)
° °  Subjective/Chief Complaint: Cc: not sleeping, which she attributes to pain. Reports eating a little more yesterday. Expresses anxiety and hesitation around having bowel movements because it causes burning pain.   Objective: Vital signs in last 24 hours: Temp:  [97.6 F (36.4 C)-98.1 F (36.7 C)] 97.6 F (36.4 C) (12/14 0943) Pulse Rate:  [52-71] 52 (12/14 0943) Resp:  [14-16] 14 (12/14 0943) BP: (100-114)/(56-60) 114/59 (12/14 0943) SpO2:  [97 %-100 %] 100 % (12/14 0943) Last BM Date: 02/20/21  Intake/Output from previous day: 12/13 0701 - 12/14 0700 In: 3709.3 [P.O.:750; I.V.:2959.3] Out: -  Intake/Output this shift: Total I/O In: 618.8 [P.O.:120; I.V.:498.8] Out: -   Alert, no distress Unlabored respirations Abdomen is soft, mild distention of the lower abdomen, mild lower abdominal tenderness, no peritonitis, RLQ scar from ileostomy reversal  No extremity edema  Lab Results:  Recent Labs    02/18/21 0415 02/20/21 0433  WBC 6.9 7.0  HGB 11.8* 12.1  HCT 36.3 37.1  PLT 232 242   BMET Recent Labs    02/19/21 0439 02/20/21 0433  NA 137 139  K 3.7 3.7  CL 105 108  CO2 23 24  GLUCOSE 91 96  BUN 7 7  CREATININE 0.58 0.52  CALCIUM 8.9 8.6*   PT/INR No results for input(s): LABPROT, INR in the last 72 hours. ABG No results for input(s): PHART, HCO3 in the last 72 hours.  Invalid input(s): PCO2, PO2  Studies/Results: No results found.  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  Presented with abdominal pain and obstructive symptoms - now having bowel function, interval improvement in abd distention - continue SOFT/ensure and monitor; unable to tolerate enough PO to be safely discharged at this point. - still  having a lot of pain with BMs, requiring IV dilaudid. Dr. Eliseo Squires making adjustments to her pain meds. Unsure the etiology of her pain - it is acute on chronic, along with her nausea. No hemorrhoids. J pouch seems to be emptying, but with pain. Could have recurrent pouchitis without signs on CT.  - no emergent surgical needs today, we will plan for EUA tomorrow to rule out recurrent stricture.   - Again, I think this patient would be best served at [facility] under the care of her colorectal surgeons and GI J-pouch specialist.     LOS: 4 days    Jill Alexanders 02/20/2021

## 2021-02-20 NOTE — Progress Notes (Signed)
° °  Subjective/Chief Complaint: Cc: not sleeping, which she attributes to pain. Reports eating a little more yesterday. Expresses anxiety and hesitation around having bowel movements because it causes burning pain.   Objective: Vital signs in last 24 hours: Temp:  [97.6 F (36.4 C)-98.1 F (36.7 C)] 97.6 F (36.4 C) (12/14 0943) Pulse Rate:  [52-71] 52 (12/14 0943) Resp:  [14-16] 14 (12/14 0943) BP: (100-114)/(56-60) 114/59 (12/14 0943) SpO2:  [97 %-100 %] 100 % (12/14 0943) Last BM Date: 02/20/21  Intake/Output from previous day: 12/13 0701 - 12/14 0700 In: 3709.3 [P.O.:750; I.V.:2959.3] Out: -  Intake/Output this shift: Total I/O In: 618.8 [P.O.:120; I.V.:498.8] Out: -   Alert, no distress Unlabored respirations Abdomen is soft, mild distention of the lower abdomen, mild lower abdominal tenderness, no peritonitis, RLQ scar from ileostomy reversal  No extremity edema  Lab Results:  Recent Labs    02/18/21 0415 02/20/21 0433  WBC 6.9 7.0  HGB 11.8* 12.1  HCT 36.3 37.1  PLT 232 242   BMET Recent Labs    02/19/21 0439 02/20/21 0433  NA 137 139  K 3.7 3.7  CL 105 108  CO2 23 24  GLUCOSE 91 96  BUN 7 7  CREATININE 0.58 0.52  CALCIUM 8.9 8.6*   PT/INR No results for input(s): LABPROT, INR in the last 72 hours. ABG No results for input(s): PHART, HCO3 in the last 72 hours.  Invalid input(s): PCO2, PO2  Studies/Results: No results found.  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  Presented with abdominal pain and obstructive symptoms - now having bowel function, interval improvement in abd distention - continue SOFT/ensure and monitor; unable to tolerate enough PO to be safely discharged at this point. - still  having a lot of pain with BMs, requiring IV dilaudid. Dr. Eliseo Squires making adjustments to her pain meds. Unsure the etiology of her pain - it is acute on chronic, along with her nausea. No hemorrhoids. J pouch seems to be emptying, but with pain. Could have recurrent pouchitis without signs on CT.  - no emergent surgical needs today, we will plan for EUA tomorrow to rule out recurrent stricture.   - Again, I think this patient would be best served at [facility] under the care of her colorectal surgeons and GI J-pouch specialist.     LOS: 4 days    Jill Alexanders 02/20/2021

## 2021-02-21 ENCOUNTER — Inpatient Hospital Stay (HOSPITAL_COMMUNITY): Payer: BC Managed Care – PPO | Admitting: Certified Registered Nurse Anesthetist

## 2021-02-21 ENCOUNTER — Encounter (HOSPITAL_COMMUNITY): Payer: Self-pay | Admitting: Internal Medicine

## 2021-02-21 ENCOUNTER — Encounter (HOSPITAL_COMMUNITY)
Admission: EM | Disposition: A | Discharge status: Home Health | Payer: Self-pay | Source: Home / Self Care | Attending: Internal Medicine

## 2021-02-21 DIAGNOSIS — R1084 Generalized abdominal pain: Secondary | ICD-10-CM

## 2021-02-21 HISTORY — PX: EVALUATION UNDER ANESTHESIA WITH ANAL FISTULECTOMY: SHX5621

## 2021-02-21 LAB — COMPREHENSIVE METABOLIC PANEL
ALT: 24 U/L (ref 0–44)
AST: 13 U/L — ABNORMAL LOW (ref 15–41)
Albumin: 3.9 g/dL (ref 3.5–5.0)
Alkaline Phosphatase: 47 U/L (ref 38–126)
Anion gap: 6 (ref 5–15)
BUN: 6 mg/dL (ref 6–20)
CO2: 26 mmol/L (ref 22–32)
Calcium: 8.9 mg/dL (ref 8.9–10.3)
Chloride: 108 mmol/L (ref 98–111)
Creatinine, Ser: 0.61 mg/dL (ref 0.44–1.00)
GFR, Estimated: >60 mL/min (ref >60)
Glucose, Bld: 105 mg/dL — ABNORMAL HIGH (ref 70–99)
Potassium: 3.6 mmol/L (ref 3.5–5.1)
Sodium: 140 mmol/L (ref 135–145)
Total Bilirubin: 0.6 mg/dL (ref 0.3–1.2)
Total Protein: 6.6 g/dL (ref 6.5–8.1)

## 2021-02-21 LAB — CBC
HCT: 38.7 % (ref 36.0–46.0)
Hemoglobin: 12.6 g/dL (ref 12.0–15.0)
MCH: 26.3 pg (ref 26.0–34.0)
MCHC: 32.6 g/dL (ref 30.0–36.0)
MCV: 80.8 fL (ref 80.0–100.0)
Platelets: 241 10*3/uL (ref 150–400)
RBC: 4.79 MIL/uL (ref 3.87–5.11)
RDW: 13.2 % (ref 11.5–15.5)
WBC: 7.1 10*3/uL (ref 4.0–10.5)
nRBC: 0 % (ref 0.0–0.2)

## 2021-02-21 SURGERY — EXAM UNDER ANESTHESIA WITH ANAL FISTULECTOMY
Anesthesia: General

## 2021-02-21 MED ORDER — LORAZEPAM 2 MG/ML IJ SOLN
1.0000 mg | Freq: Once | INTRAMUSCULAR | Status: AC
  Administered 2021-02-21: 1 mg via INTRAVENOUS
  Filled 2021-02-21: qty 1

## 2021-02-21 MED ORDER — PROPOFOL 500 MG/50ML IV EMUL
INTRAVENOUS | Status: DC | PRN
  Administered 2021-02-21: 150 ug/kg/min via INTRAVENOUS

## 2021-02-21 MED ORDER — DIBUCAINE (PERIANAL) 1 % EX OINT
TOPICAL_OINTMENT | CUTANEOUS | Status: AC
  Filled 2021-02-21: qty 28

## 2021-02-21 MED ORDER — CHLORHEXIDINE GLUCONATE 0.12 % MT SOLN
15.0000 mL | Freq: Once | OROMUCOSAL | Status: AC
  Administered 2021-02-21: 15 mL via OROMUCOSAL

## 2021-02-21 MED ORDER — FENTANYL CITRATE PF 50 MCG/ML IJ SOSY
PREFILLED_SYRINGE | INTRAMUSCULAR | Status: AC
  Administered 2021-02-21: 50 ug via INTRAVENOUS
  Filled 2021-02-21: qty 1

## 2021-02-21 MED ORDER — LACTATED RINGERS IV SOLN: INTRAVENOUS | Status: DC

## 2021-02-21 MED ORDER — MIDAZOLAM HCL 2 MG/2ML IJ SOLN
INTRAMUSCULAR | Status: AC
  Filled 2021-02-21: qty 2

## 2021-02-21 MED ORDER — AMISULPRIDE (ANTIEMETIC) 5 MG/2ML IV SOLN: 10.0000 mg | Freq: Once | INTRAVENOUS | Status: AC | PRN

## 2021-02-21 MED ORDER — ONDANSETRON HCL 4 MG/2ML IJ SOLN
INTRAMUSCULAR | Status: AC
  Filled 2021-02-21: qty 2

## 2021-02-21 MED ORDER — ACETAMINOPHEN 500 MG PO TABS: 1000.0000 mg | ORAL_TABLET | Freq: Once | ORAL | Status: DC

## 2021-02-21 MED ORDER — ONDANSETRON HCL 4 MG/2ML IJ SOLN
INTRAMUSCULAR | Status: DC | PRN
  Administered 2021-02-21: 4 mg via INTRAVENOUS

## 2021-02-21 MED ORDER — SODIUM CHLORIDE (PF) 0.9 % IJ SOLN
INTRAMUSCULAR | Status: AC
  Filled 2021-02-21: qty 20

## 2021-02-21 MED ORDER — PROPOFOL 10 MG/ML IV BOLUS
INTRAVENOUS | Status: DC | PRN
  Administered 2021-02-21: 130 mg via INTRAVENOUS

## 2021-02-21 MED ORDER — DEXAMETHASONE SODIUM PHOSPHATE 10 MG/ML IJ SOLN
INTRAMUSCULAR | Status: AC
  Filled 2021-02-21: qty 1

## 2021-02-21 MED ORDER — FENTANYL CITRATE (PF) 100 MCG/2ML IJ SOLN
INTRAMUSCULAR | Status: AC
  Filled 2021-02-21: qty 2

## 2021-02-21 MED ORDER — MIDAZOLAM HCL 5 MG/5ML IJ SOLN
INTRAMUSCULAR | Status: DC | PRN
  Administered 2021-02-21: 2 mg via INTRAVENOUS

## 2021-02-21 MED ORDER — PROPOFOL 10 MG/ML IV BOLUS
INTRAVENOUS | Status: AC
  Filled 2021-02-21: qty 20

## 2021-02-21 MED ORDER — FENTANYL CITRATE PF 50 MCG/ML IJ SOSY
PREFILLED_SYRINGE | INTRAMUSCULAR | Status: AC
  Administered 2021-02-21: 50 ug via INTRAVENOUS
  Filled 2021-02-21: qty 2

## 2021-02-21 MED ORDER — BUPIVACAINE-EPINEPHRINE (PF) 0.25% -1:200000 IJ SOLN
INTRAMUSCULAR | Status: AC
  Filled 2021-02-21: qty 30

## 2021-02-21 MED ORDER — LIDOCAINE HCL URETHRAL/MUCOSAL 2 % EX GEL
1.0000 "application " | Freq: Once | CUTANEOUS | Status: AC
  Administered 2021-02-21: 1 via TOPICAL

## 2021-02-21 MED ORDER — LIDOCAINE HCL URETHRAL/MUCOSAL 2 % EX GEL
CUTANEOUS | Status: AC
  Filled 2021-02-21: qty 30

## 2021-02-21 MED ORDER — AMISULPRIDE (ANTIEMETIC) 5 MG/2ML IV SOLN
INTRAVENOUS | Status: AC
  Administered 2021-02-21: 10 mg via INTRAVENOUS
  Filled 2021-02-21: qty 4

## 2021-02-21 MED ORDER — PROPOFOL 1000 MG/100ML IV EMUL
INTRAVENOUS | Status: AC
  Filled 2021-02-21: qty 100

## 2021-02-21 MED ORDER — FENTANYL CITRATE (PF) 100 MCG/2ML IJ SOLN
INTRAMUSCULAR | Status: DC | PRN
  Administered 2021-02-21: 50 ug via INTRAVENOUS

## 2021-02-21 MED ORDER — DEXAMETHASONE SODIUM PHOSPHATE 10 MG/ML IJ SOLN
INTRAMUSCULAR | Status: DC | PRN
  Administered 2021-02-21: 5 mg via INTRAVENOUS

## 2021-02-21 MED ORDER — FENTANYL CITRATE PF 50 MCG/ML IJ SOSY
25.0000 ug | PREFILLED_SYRINGE | INTRAMUSCULAR | Status: DC | PRN
  Administered 2021-02-21: 50 ug via INTRAVENOUS

## 2021-02-21 MED ORDER — LIDOCAINE HCL (PF) 2 % IJ SOLN
INTRAMUSCULAR | Status: AC
  Filled 2021-02-21: qty 5

## 2021-02-21 MED ORDER — BUPIVACAINE LIPOSOME 1.3 % IJ SUSP
INTRAMUSCULAR | Status: AC
  Filled 2021-02-21: qty 20

## 2021-02-21 MED ORDER — LIDOCAINE HCL URETHRAL/MUCOSAL 2 % EX GEL
CUTANEOUS | Status: DC | PRN
  Administered 2021-02-21: 1

## 2021-02-21 MED ORDER — LIDOCAINE 2% (20 MG/ML) 5 ML SYRINGE
INTRAMUSCULAR | Status: DC | PRN
  Administered 2021-02-21: 60 mg via INTRAVENOUS

## 2021-02-21 SURGICAL SUPPLY — 34 items
BAG COUNTER SPONGE SURGICOUNT (BAG) IMPLANT
BAG SURGICOUNT SPONGE COUNTING (BAG)
BLADE HEX COATED 2.75 (ELECTRODE) ×3 IMPLANT
BLADE SURG 15 STRL LF DISP TIS (BLADE) ×1 IMPLANT
BLADE SURG 15 STRL SS (BLADE) ×2
DECANTER SPIKE VIAL GLASS SM (MISCELLANEOUS) ×3 IMPLANT
DRAPE SHEET LG 3/4 BI-LAMINATE (DRAPES) ×3 IMPLANT
DRSG PAD ABDOMINAL 8X10 ST (GAUZE/BANDAGES/DRESSINGS) IMPLANT
ELECT REM PT RETURN 15FT ADLT (MISCELLANEOUS) ×3 IMPLANT
GAUZE 4X4 16PLY ~~LOC~~+RFID DBL (SPONGE) ×3 IMPLANT
GAUZE SPONGE 4X4 12PLY STRL (GAUZE/BANDAGES/DRESSINGS) IMPLANT
GLOVE SURG MICRO LTX SZ7.5 (GLOVE) ×3 IMPLANT
GLOVE SURG UNDER LTX SZ8 (GLOVE) ×6 IMPLANT
GLOVE SURG UNDER POLY LF SZ7 (GLOVE) ×3 IMPLANT
GOWN STRL REUS W/TWL LRG LVL3 (GOWN DISPOSABLE) ×3 IMPLANT
GOWN STRL REUS W/TWL XL LVL3 (GOWN DISPOSABLE) ×6 IMPLANT
KIT BASIN OR (CUSTOM PROCEDURE TRAY) ×3 IMPLANT
KIT TURNOVER KIT A (KITS) IMPLANT
NDL HYPO 25X1 1.5 SAFETY (NEEDLE) ×1 IMPLANT
NDL SAFETY ECLIPSE 18X1.5 (NEEDLE) IMPLANT
NEEDLE HYPO 18GX1.5 SHARP (NEEDLE)
NEEDLE HYPO 25X1 1.5 SAFETY (NEEDLE) ×3 IMPLANT
NS IRRIG 1000ML POUR BTL (IV SOLUTION) ×3 IMPLANT
PACK LITHOTOMY IV (CUSTOM PROCEDURE TRAY) ×3 IMPLANT
PENCIL SMOKE EVACUATOR (MISCELLANEOUS) IMPLANT
SPONGE SURGIFOAM ABS GEL 12-7 (HEMOSTASIS) ×9 IMPLANT
SURGILUBE 2OZ TUBE FLIPTOP (MISCELLANEOUS) ×3 IMPLANT
SUT CHROMIC 2 0 SH (SUTURE) IMPLANT
SUT CHROMIC 3 0 SH 27 (SUTURE) IMPLANT
SUT MON AB 3-0 SH 27 (SUTURE)
SUT MON AB 3-0 SH27 (SUTURE) IMPLANT
SUT VIC AB 4-0 P2 18 (SUTURE) ×3 IMPLANT
SYR CONTROL 10ML LL (SYRINGE) ×3 IMPLANT
TOWEL OR 17X26 10 PK STRL BLUE (TOWEL DISPOSABLE) ×3 IMPLANT

## 2021-02-21 NOTE — Anesthesia Procedure Notes (Signed)
Procedure Name: LMA Insertion Date/Time: 02/21/2021 1:13 PM Performed by: Talbot Grumbling, CRNA Pre-anesthesia Checklist: Patient identified, Emergency Drugs available, Suction available and Patient being monitored Patient Re-evaluated:Patient Re-evaluated prior to induction Oxygen Delivery Method: Circle system utilized Preoxygenation: Pre-oxygenation with 100% oxygen Induction Type: IV induction Ventilation: Mask ventilation without difficulty LMA: LMA inserted LMA Size: 3.0 Number of attempts: 1 Placement Confirmation: positive ETCO2 and breath sounds checked- equal and bilateral Tube secured with: Tape Dental Injury: Teeth and Oropharynx as per pre-operative assessment

## 2021-02-21 NOTE — Anesthesia Postprocedure Evaluation (Signed)
Anesthesia Post Note  Patient: Lisa Donovan  Procedure(s) Performed: EXAM UNDER ANESTHESIA WITH ANAL FISTULECTOMY     Patient location during evaluation: PACU Anesthesia Type: General Level of consciousness: sedated Pain management: pain level controlled Vital Signs Assessment: post-procedure vital signs reviewed and stable Respiratory status: spontaneous breathing and respiratory function stable Cardiovascular status: stable Postop Assessment: no apparent nausea or vomiting Anesthetic complications: no   No notable events documented.  Last Vitals:  Vitals:   02/21/21 1445 02/21/21 1500  BP: 125/68 117/76  Pulse: (!) 50 (!) 55  Resp: 16 12  Temp:    SpO2: 100% 100%    Last Pain:  Vitals:   02/21/21 1500  TempSrc:   PainSc: [place]

## 2021-02-21 NOTE — Progress Notes (Signed)
Spoke to [facility] on [place]. Received report for surgery.

## 2021-02-21 NOTE — Interval H&P Note (Signed)
History and Physical Interval Note:  02/21/2021 11:32 AM  Lisa Donovan  has presented today for surgery, with the diagnosis of ANAL STRICTURE.  The various methods of treatment have been discussed with the patient and family. After consideration of risks, benefits and other options for treatment, the patient has consented to  Procedure(s): EXAM UNDER ANESTHESIA WITH ANAL FISTULECTOMY (N/A) as a surgical intervention.  The patient's history has been reviewed, patient examined, no change in status, stable for surgery.  I have reviewed the patient's chart and labs.  Questions were answered to the patient's satisfaction.    The procedure has been discussed with the patient.  Alternative therapies have been discussed with the patient.  Operative risks include bleeding,  Infection,  Organ injury,  Nerve injury,  Blood vessel injury,  DVT,  Pulmonary embolism,  Death,  And possible reoperation.  Medical management risks include worsening of present situation.  The success of the procedure is 50 -90 % at treating patients symptoms.  The patient understands and agrees to proceed.  [place]

## 2021-02-21 NOTE — Progress Notes (Signed)
Prior to surgery today, family and pt showed RN stomach distention, esp standing up.  Abdomen it slightly distended around the umbiblicus, more on the left than right.  Pt and mother concerned, because it is different than what she looks and feels in the abdomen.  Pt states I usually am concave in that area.  Pt had a small loose BM this morning , yellow in color she stated.   Notified MD and will continue to monitor.

## 2021-02-21 NOTE — Progress Notes (Signed)
PROGRESS NOTE    Lisa Donovan  DGL:[id number] DOB: 2000-08-09 DOA: 02/15/2021 PCP: Inda Coke, PA    Brief Narrative:   20 year old female history of [name], [name] status post proctocolectomy with J-pouch June 2022 with diverting loop ileostomy reversal August 2022.  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  Patient noted to have undergoing anesthesia for excision of desmoid tumors on 02/14/2021 and noted following procedure and not had any bowel movement or flatus which was unusual as patient noted to have 5-6 bowel movements daily typically an hour after eating.  Patient presented worsening abdominal pain, nausea, vomiting and noted to be dehydrated.  CT scan done concerning for small bowel obstruction versus ileus.  Patient seen in the ED and attempts were made to transfer patient to [place] (patient general surgeon and gastroenterologist) but due to bed availability patient subsequently admitted to [facility], general surgery consulted for further evaluation.  Patient placed on bowel rest, IV fluids, pain management, supportive care.  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 -Patient seen in consultation by general surgery. -Patient with continued complaints of abdominal pain and rectal pain with bowel movements that feel like burning sensation/reflux. -continue analgesia as needed, presently on po oxy and IV dilaudid prn -General Surgery following. Plans for exam under anesthesia  with dilation of pouch outflow noted for today per Surgery  [name] -Outpatient follow-up.    DVT prophylaxis: SCD's Code Status: Full Family Communication: Pt in room, family at bedside  Status is: Inpatient  Remains inpatient appropriate because: Severity of illness    Consultants:  General Surgery  Procedures:  Exam under anesthesia 02/21/21  Antimicrobials: Anti-infectives (From admission, onward)    Start     Dose/Rate Route Frequency Ordered Stop   02/21/21 0600  [MAR Hold]  cefoTEtan (CEFOTAN) 2 g in sodium chloride 0.9 % 100 mL IVPB        (MAR Hold since Thu 02/21/2021 at 1146.Hold Reason: Transfer to a Procedural area)  Note to Pharmacy: Pharmacy may adjust dose strength for optimal dosing.   Send with patient on call to the OR.  Anesthesia to complete antibiotic administration <46min prior to incision per [facility].   2 g 200 mL/hr over 30 Minutes Intravenous On call to O.R. 02/20/21 1635 02/21/21 1336       Subjective: Feeling anxious about today's procedure. Pt was seen prior to procedure  Objective: Vitals:   02/21/21 1400 02/21/21 1415 02/21/21 1420 02/21/21 1430  BP: 113/61 114/65  127/85  Pulse: (!) 53 (!) 56  83  Resp: (!) 9 (!) 9  14  Temp:   97.8 F (36.6 C)   TempSrc:      SpO2: 100% 96%  98%  Weight:      Height:        Intake/Output Summary (Last 24 hours) at 02/21/2021 1433 Last data filed at 02/21/2021 1345 Gross per 24 hour  Intake 2694.58 ml  Output 0 ml  Net 2694.58 ml   Filed Weights   02/15/21 2015 02/15/21 2026 02/21/21 1156  Weight:  53.1 kg 53.1 kg 53.1 kg    Examination: General exam: Awake, laying in bed, in nad Respiratory system: Normal respiratory effort, no wheezing Cardiovascular system: regular rate, s1, s2 Gastrointestinal system: Soft, nondistended, positive BS Central nervous system: CN2-12 grossly intact, strength intact Extremities: Perfused, no clubbing Skin: Normal skin turgor, no notable  skin lesions seen Psychiatry: Mood normal // no visual hallucinations   Data Reviewed: I have personally reviewed following labs and imaging studies  CBC: Recent Labs  Lab 02/15/21 2050 02/17/21 0452 02/18/21 0415 02/20/21 0433 02/21/21 0431  WBC 13.0* 7.5 6.9 7.0 7.1  NEUTROABS 10.3*  --   --  3.9  --   HGB 14.3 11.3* 11.8* 12.1 12.6  HCT 44.7 36.4 36.3 37.1 38.7  MCV 80.8 83.7 82.3 80.8 80.8  PLT 275 199 232 242 124   Basic Metabolic Panel: Recent Labs  Lab 02/17/21 0452 02/18/21 0415 02/19/21 0439 02/20/21 0433 02/21/21 0431  NA 137 135 137 139 140  K 3.5 3.7 3.7 3.7 3.6  CL 106 105 105 108 108  CO2 23 22 23 24 26   GLUCOSE 74 81 91 96 105*  BUN 9 8 7 7 6   CREATININE 0.55 0.55 0.58 0.52 0.61  CALCIUM 8.6* 8.6* 8.9 8.6* 8.9  MG 2.2 2.0 2.1  --   --   PHOS  --  2.4*  --   --   --    GFR: Estimated Creatinine Clearance: 93.2 mL/min (by C-G formula based on SCr of 0.61 mg/dL). Liver Function Tests: Recent Labs  Lab 02/15/21 2050 02/17/21 0452 02/18/21 0415 02/21/21 0431  AST 17 20  --  13*  ALT 16 25  --  24  ALKPHOS 49 41  --  47  BILITOT 0.5 1.0  --  0.6  PROT 7.2 5.9*  --  6.6  ALBUMIN 4.5 3.4* 3.5 3.9   Recent Labs  Lab 02/15/21 2050  LIPASE 17   No results for input(s): AMMONIA in the last 168 hours. Coagulation Profile: No results for input(s): INR, PROTIME in the last 168 hours. Cardiac Enzymes: No results for input(s): CKTOTAL, CKMB, CKMBINDEX, TROPONINI in the last 168 hours. BNP (last 3 results) No results for input(s): PROBNP in the last 8760 hours. HbA1C: No results for input(s): HGBA1C in the last 72 hours. CBG: No results for input(s): GLUCAP in the last 168 hours. Lipid Profile: No results for input(s): CHOL, HDL, LDLCALC, TRIG, CHOLHDL, LDLDIRECT in the last 72 hours. Thyroid Function Tests: No results for input(s): TSH, T4TOTAL, FREET4, T3FREE, THYROIDAB in the last 72 hours. Anemia Panel: No results for input(s): VITAMINB12,  FOLATE, FERRITIN, TIBC, IRON, RETICCTPCT in the last 72 hours. Sepsis Labs: No results for input(s): PROCALCITON, LATICACIDVEN in the last 168 hours.  Recent Results (from the past 240 hour(s))  Resp Panel by RT-PCR (Flu A&B, Covid) Nasopharyngeal Swab     Status: None   Collection Time: 02/16/21 12:15 AM   Specimen: Nasopharyngeal Swab; Nasopharyngeal(NP) swabs in vial transport medium  Result Value Ref Range Status   SARS Coronavirus 2 by RT PCR NEGATIVE NEGATIVE Final    Comment: (NOTE) SARS-CoV-2 target nucleic acids are NOT DETECTED.  The SARS-CoV-2 RNA is generally detectable in upper respiratory specimens during the acute phase of infection. The lowest concentration of SARS-CoV-2 viral copies this assay can detect is 138 copies/mL. A negative result does not preclude SARS-Cov-2 infection and should not be used as the sole basis for treatment or other patient management decisions.  A negative result may occur with  improper specimen collection/handling, submission of specimen other than nasopharyngeal swab, presence of viral mutation(s) within the areas targeted by this assay, and inadequate number of viral copies(<138 copies/mL). A negative result must be combined with clinical observations, patient history, and epidemiological information. The expected result is Negative.  Fact Sheet for Patients:  EntrepreneurPulse.com.au  Fact Sheet for Healthcare Providers:  IncredibleEmployment.be  This test is no t yet approved or cleared by the [place] FDA and  has been authorized for detection and/or diagnosis of SARS-CoV-2 by FDA under an Emergency Use Authorization (EUA). This EUA will remain  in effect (meaning this test can be used) for the duration of the COVID-19 declaration under Section 564(b)(1) of the Act, 21 U.S.C.section 360bbb-3(b)(1), unless the authorization is terminated  or revoked sooner.       Influenza A by PCR  NEGATIVE NEGATIVE Final   Influenza B by PCR NEGATIVE NEGATIVE Final    Comment: (NOTE) The Xpert Xpress SARS-CoV-2/FLU/RSV plus assay is intended as an aid in the diagnosis of influenza from Nasopharyngeal swab specimens and should not be used as a sole basis for treatment. Nasal washings and aspirates are unacceptable for Xpert Xpress SARS-CoV-2/FLU/RSV testing.  Fact Sheet for Patients: EntrepreneurPulse.com.au  Fact Sheet for Healthcare Providers: IncredibleEmployment.be  This test is not yet approved or cleared by the [place] FDA and has been authorized for detection and/or diagnosis of SARS-CoV-2 by FDA under an Emergency Use Authorization (EUA). This EUA will remain in effect (meaning this test can be used) for the duration of the COVID-19 declaration under Section 564(b)(1) of the Act, 21 U.S.C. section 360bbb-3(b)(1), unless the authorization is terminated or revoked.  Performed at [facility], [address] [place], [place] [address]      Radiology Studies: No results found.  Scheduled Meds:  acetaminophen  1,000 mg Oral Once   [MAR Hold] feeding supplement  237 mL Oral TID WC   fentaNYL       [MAR Hold] hydrocortisone   Rectal TID   lidocaine  1 application Topical Once   [MAR Hold] liver oil-zinc oxide   Topical TID   [MAR Hold] mirtazapine  7.5 mg Oral QHS   [MAR Hold] pantoprazole (PROTONIX) IV  40 mg Intravenous Q12H   [MAR Hold] simethicone  80 mg Oral QID   Continuous Infusions:  sodium chloride 125 mL/hr at 02/21/21 0346   lactated ringers 10 mL/hr at 02/21/21 1306     LOS: 5 days   Marylu Lund, MD [facility] Pager On Amion  If 7PM-7AM, please contact night-coverage 02/21/2021, 2:33 PM

## 2021-02-21 NOTE — Transfer of Care (Signed)
Immediate Anesthesia Transfer of Care Note  Patient: Lisa Donovan  Procedure(s) Performed: EXAM UNDER ANESTHESIA WITH ANAL FISTULECTOMY  Patient Location: PACU  Anesthesia Type:General  Level of Consciousness: sedated  Airway & Oxygen Therapy: Patient Spontanous Breathing and Patient connected to face mask oxygen  Post-op Assessment: Report given to RN and Post -op Vital signs reviewed and stable  Post vital signs: Reviewed and stable  Last Vitals:  Vitals Value Taken Time  BP 107/60 02/21/21 1342  Temp    Pulse 50 02/21/21 1344  Resp 9 02/21/21 1344  SpO2 100 % 02/21/21 1344  Vitals shown include unvalidated device data.  Last Pain:  Vitals:   02/21/21 1156  TempSrc:   PainSc: 6       Patients Stated Pain Goal: 5 (45/03/88 8280)  Complications: No notable events documented.

## 2021-02-21 NOTE — Op Note (Signed)
Preoperative diagnosis: History of J-pouch secondary to total abdominal colectomy secondary to [name] with stricture of anastomosis at anoderm  Postoperative diagnosis: Same  Procedure: Exam under anesthesia with dilation of pouch outflow  Surgeon: Erroll Luna, MD  Anesthesia: LMA with topical 1% lidocaine jelly  EBL: Minimal  Specimen: None  Drains: None  Indications for procedure: The patient is a 20 year old female with a history of total abdominal colectomy and subsequent J-pouch formation at [facility].  The patch was treated back in June of this year.  She has had issues with stenosis and was admitted for dehydration, nausea and vomiting to medical service.  Work-up was unremarkable.  The case was discussed with her providers in [place].  They recommended potential exam under esthesia to exclude any outflow stenosis contributing to her symptoms.  Of note, she had a pouchoscopy done at [facility] in November and a dilation then.  She had not improved after multiple days of medical management and I discussed this with her and her mother.  They wish to proceed to have the patch evaluated the operating room.  Risks of bleeding, infection, patch malfunction, damage to patch, need for revisional surgery, and lack of improvement of underlying condition discussed.   Description of procedure: The patient was met in the holding area and questions were answered.  She was taken back to the operating room and placed supine.  After induction of general esthesia she was placed in lithotomy and appropriately padded.  The perineum was prepped and draped in sterile fashion.  Timeout performed.  I was able to use my finger to advance through the staple line easily.  This was minimally stenosed if any.  There was some signs of inflammation and pouchitis.  There was some along the right anoderm granulation tissue noted.  There is no active bleeding.  Everything felt intact with no significant stricturing or other abnormality  that I can palpate.  Visual inspection revealed the granulation tissue as stated above but no other abnormality was noted.  This was done very gently.  There is no significant resistance and there is no significant amount of contraction noted through the anastomosis.  The staple line was easily felt.  Lidocaine jelly was applied to the anal canal.  All counts were found to be correct at this portion of the case.  She was then taken to lithotomy extubated taken recovery in satisfactory condition.

## 2021-02-21 NOTE — Anesthesia Preprocedure Evaluation (Addendum)
Anesthesia Evaluation  Patient identified by MRN, date of birth, ID band Patient awake    Reviewed: Allergy & Precautions, NPO status , Patient's Chart, lab work & pertinent test results  Airway Mallampati: II  TM Distance: >3 FB Neck ROM: Full    Dental  (+) Dental Advisory Given   Pulmonary neg pulmonary ROS,    breath sounds clear to auscultation       Cardiovascular negative cardio ROS   Rhythm:Regular Rate:Normal     Neuro/Psych negative neurological ROS     GI/Hepatic Neg liver ROS, GERD  ,[name] s/p total proctocolectomy   Endo/Other  negative endocrine ROS  Renal/GU negative Renal ROS     Musculoskeletal   Abdominal   Peds  Hematology negative hematology ROS (+)   Anesthesia Other Findings   Reproductive/Obstetrics                             Anesthesia Physical Anesthesia Plan  ASA: 2  Anesthesia Plan: General   Post-op Pain Management: Minimal or no pain anticipated and Tylenol PO (pre-op)   Induction:   PONV Risk Score and Plan: 3 and Dexamethasone, Ondansetron, Midazolam, Treatment may vary due to age or medical condition, Propofol infusion and TIVA  Airway Management Planned: LMA and Oral ETT  Additional Equipment:   Intra-op Plan:   Post-operative Plan: Extubation in OR  Informed Consent: I have reviewed the patients History and Physical, chart, labs and discussed the procedure including the risks, benefits and alternatives for the proposed anesthesia with the patient or authorized representative who has indicated his/her understanding and acceptance.     Dental advisory given  Plan Discussed with:   Anesthesia Plan Comments:        Anesthesia Quick Evaluation

## 2021-02-22 ENCOUNTER — Inpatient Hospital Stay: Payer: Self-pay

## 2021-02-22 ENCOUNTER — Encounter (HOSPITAL_COMMUNITY): Payer: Self-pay | Admitting: Surgery

## 2021-02-22 ENCOUNTER — Inpatient Hospital Stay (HOSPITAL_COMMUNITY): Payer: BC Managed Care – PPO

## 2021-02-22 LAB — COMPREHENSIVE METABOLIC PANEL
ALT: 19 U/L (ref 0–44)
AST: 12 U/L — ABNORMAL LOW (ref 15–41)
Albumin: 3.6 g/dL (ref 3.5–5.0)
Alkaline Phosphatase: 41 U/L (ref 38–126)
Anion gap: 7 (ref 5–15)
BUN: 8 mg/dL (ref 6–20)
CO2: 24 mmol/L (ref 22–32)
Calcium: 8.8 mg/dL — ABNORMAL LOW (ref 8.9–10.3)
Chloride: 106 mmol/L (ref 98–111)
Creatinine, Ser: 0.55 mg/dL (ref 0.44–1.00)
GFR, Estimated: >60 mL/min (ref >60)
Glucose, Bld: 142 mg/dL — ABNORMAL HIGH (ref 70–99)
Potassium: 3.7 mmol/L (ref 3.5–5.1)
Sodium: 137 mmol/L (ref 135–145)
Total Bilirubin: 0.6 mg/dL (ref 0.3–1.2)
Total Protein: 6.2 g/dL — ABNORMAL LOW (ref 6.5–8.1)

## 2021-02-22 LAB — CBC
HCT: 38.8 % (ref 36.0–46.0)
Hemoglobin: 12.7 g/dL (ref 12.0–15.0)
MCH: 27 pg (ref 26.0–34.0)
MCHC: 32.7 g/dL (ref 30.0–36.0)
MCV: 82.4 fL (ref 80.0–100.0)
Platelets: 246 10*3/uL (ref 150–400)
RBC: 4.71 MIL/uL (ref 3.87–5.11)
RDW: 13.1 % (ref 11.5–15.5)
WBC: 8.1 10*3/uL (ref 4.0–10.5)
nRBC: 0 % (ref 0.0–0.2)

## 2021-02-22 MED ORDER — IOHEXOL 9 MG/ML PO SOLN
500.0000 mL | ORAL | Status: AC
  Administered 2021-02-22 (×2): 500 mL via ORAL

## 2021-02-22 MED ORDER — SODIUM CHLORIDE 0.9% FLUSH
10.0000 mL | INTRAVENOUS | Status: DC | PRN
  Administered 2021-03-01 – 2021-03-02 (×2): 10 mL

## 2021-02-22 MED ORDER — METRONIDAZOLE 500 MG/100ML IV SOLN
500.0000 mg | Freq: Two times a day (BID) | INTRAVENOUS | Status: DC
Start: 2021-02-22 — End: 2021-02-28
  Administered 2021-02-22 – 2021-02-28 (×13): 500 mg via INTRAVENOUS
  Filled 2021-02-22 (×13): qty 100

## 2021-02-22 MED ORDER — CIPROFLOXACIN IN D5W 400 MG/200ML IV SOLN
400.0000 mg | Freq: Two times a day (BID) | INTRAVENOUS | Status: DC
  Administered 2021-02-22 – 2021-02-28 (×13): 400 mg via INTRAVENOUS
  Filled 2021-02-22 (×13): qty 200

## 2021-02-22 MED ORDER — SODIUM CHLORIDE 0.9% FLUSH: 10.0000 mL | Freq: Two times a day (BID) | INTRAVENOUS | Status: DC

## 2021-02-22 MED ORDER — IOHEXOL 350 MG/ML SOLN
80.0000 mL | Freq: Once | INTRAVENOUS | Status: AC | PRN
  Administered 2021-02-22: 80 mL via INTRAVENOUS

## 2021-02-22 MED ORDER — POTASSIUM CHLORIDE CRYS ER 20 MEQ PO TBCR
40.0000 meq | EXTENDED_RELEASE_TABLET | Freq: Once | ORAL | Status: AC
  Administered 2021-02-22: 40 meq via ORAL
  Filled 2021-02-22: qty 2

## 2021-02-22 MED ORDER — HYDROMORPHONE HCL 1 MG/ML IJ SOLN
0.5000 mg | Freq: Once | INTRAMUSCULAR | Status: AC
  Administered 2021-02-22: 0.5 mg via INTRAVENOUS

## 2021-02-22 MED ORDER — CHLORHEXIDINE GLUCONATE CLOTH 2 % EX PADS
6.0000 | MEDICATED_PAD | Freq: Every day | CUTANEOUS | Status: DC
  Administered 2021-02-23 – 2021-03-02 (×7): 6 via TOPICAL

## 2021-02-22 NOTE — Progress Notes (Signed)
IVT consulted for PIV difficult stick.  IVT RN attempted x2 with u/s.  Veins blew x2.  MD secure chatted and advised unable to gain assess; MD states will place order for PICC.  Advised MD previous difficulty with PICC placement at [facility] per pt.

## 2021-02-22 NOTE — Progress Notes (Signed)
1 Day Post-Op   Subjective/Chief Complaint: abdominal distention/pain  Pt stable POD 1  Still has burning and abdominal distention /pain which is intermittent    Objective: Vital signs in last 24 hours: Temp:  [97.6 F (36.4 C)-98.7 F (37.1 C)] 98.3 F (36.8 C) (12/16 0532) Pulse Rate:  [50-83] 71 (12/16 0532) Resp:  [9-20] 16 (12/16 0532) BP: (103-127)/(56-85) 103/63 (12/16 0532) SpO2:  [94 %-100 %] 97 % (12/16 0532) Weight:  [50.8 kg-53.1 kg] 50.8 kg (12/16 0500) Last BM Date: 02/21/21  Intake/Output from previous day: 12/15 0701 - 12/16 0700 In: 2413.7 [P.O.:240; I.V.:2073.7; IV Piggyback:100] Out: 450 [Urine:450] Intake/Output this shift: No intake/output data recorded.   Alert, no distress Unlabored respirations Abdomen is soft, mild distention of the lower abdomen, mild lower abdominal tenderness, no peritonitis, RLQ scar from ileostomy reversal  No extremity edema Lab Results:  Recent Labs    02/21/21 0431 02/22/21 0441  WBC 7.1 8.1  HGB 12.6 12.7  HCT 38.7 38.8  PLT 241 246   BMET Recent Labs    02/21/21 0431 02/22/21 0441  NA 140 137  K 3.6 3.7  CL 108 106  CO2 26 24  GLUCOSE 105* 142*  BUN 6 8  CREATININE 0.61 0.55  CALCIUM 8.9 8.8*   PT/INR No results for input(s): LABPROT, INR in the last 72 hours. ABG No results for input(s): PHART, HCO3 in the last 72 hours.  Invalid input(s): PCO2, PO2  Studies/Results: No results found.  Anti-infectives: Anti-infectives (From admission, onward)    Start     Dose/Rate Route Frequency Ordered Stop   02/21/21 0600  cefoTEtan (CEFOTAN) 2 g in sodium chloride 0.9 % 100 mL IVPB       Note to Pharmacy: Pharmacy may adjust dose strength for optimal dosing.   Send with patient on call to the OR.  Anesthesia to complete antibiotic administration <58min prior to incision per [facility].   2 g 200 mL/hr over 30 Minutes Intravenous On call to O.R. 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  Presented with abdominal pain and obstructive symptoms   - now having bowel function, interval improvement in abd distention - continue SOFT/ensure and monitor; unable to tolerate enough PO to be safely discharged at this point. - still having a lot of pain with BMs, requiring IV dilaudid. Dr. Eliseo Squires making adjustments to her pain meds. Unsure the etiology of her pain - it is acute on chronic, along with her nausea. No hemorrhoids. J pouch seems to be emptying, but with pain. Could have recurrent pouchitis without signs on CT. - plan to repeat CT with oral contrast today, start cipro / flagyl for possible pouchitis   No sign of pouch obstruction otherwise    - no emergent surgical needs   - unfortunately  present interventions have not helped      - I think this patient would be best served at [facility] under the care of a  colorectal surgeons and GI J-pouch specialist.   We have reached out to her  primary surgical team . We have followed their recommendations so far.   Try the above and reassess     LOS: 6 days    Lisa Donovan 02/22/2021

## 2021-02-22 NOTE — Progress Notes (Signed)
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  Patient noted to have undergoing anesthesia for excision of desmoid tumors on 02/14/2021 and noted following procedure and not had any bowel movement or flatus which was unusual as patient noted to have 5-6 bowel movements daily typically an hour after eating.  Patient presented worsening abdominal pain, nausea, vomiting and noted to be dehydrated.  CT scan done concerning for small bowel obstruction versus ileus.  Patient seen in the ED and attempts were made to transfer patient to [place] (patient general surgeon and gastroenterologist) but due to bed availability patient subsequently admitted to [facility], general surgery consulted for further evaluation.  Patient placed on bowel rest, IV fluids, pain management, supportive care.  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 -Patient seen in consultation by general surgery. -Patient with continued complaints of abdominal pain and rectal pain with bowel movements that feel like burning sensation/reflux. -continue analgesia as needed, presently on po oxy and IV dilaudid prn -General Surgery following. Now s/p exam under anesthesia  12/15. Per General Surgery, recommendation for starting cipro/flagyl for possible pouchitis with repeat CT with oral contrast  [name] -Outpatient follow-up.    DVT prophylaxis: SCD's Code Status: Full Family Communication: Pt in room, family at bedside  Status is: Inpatient  Remains inpatient appropriate because: Severity of illness    Consultants:  General Surgery  Procedures:  Exam under anesthesia 02/21/21  Antimicrobials: Anti-infectives (From admission, onward)    Start     Dose/Rate Route Frequency Ordered Stop   02/22/21 1000  ciprofloxacin (CIPRO) IVPB 400 mg        400 mg 200 mL/hr over 60 Minutes Intravenous Every 12 hours 02/22/21 0943     02/22/21 1000  metroNIDAZOLE (FLAGYL) IVPB 500 mg        500 mg 100 mL/hr over 60 Minutes Intravenous Every 12 hours 02/22/21 0944     02/21/21 0600  cefoTEtan (CEFOTAN) 2 g in sodium chloride 0.9 % 100 mL IVPB       Note to Pharmacy: Pharmacy may adjust dose strength for optimal dosing.   Send with patient on call to the OR.  Anesthesia to complete antibiotic administration <39min prior to incision per [facility].   2 g 200 mL/hr over 30 Minutes Intravenous On call to O.R. 02/20/21 1635 02/21/21 1336       Subjective: Seen while pt was trying to drink oral contrast. Complaining of marked abd pain   Objective: Vitals:   02/22/21 0500 02/22/21 0532 02/22/21 1028 02/22/21 1344  BP:  103/63 (!) 115/50 105/60  Pulse:  71 (!) 55 66  Resp:  16 18 18   Temp:  98.3 F (36.8 C) 98 F (36.7 C) 98.4 F (36.9 C)  TempSrc:  Oral Oral Oral  SpO2:  97%  97% 98%  Weight: 50.8 kg     Height:        Intake/Output Summary (Last 24 hours) at 02/22/2021 1357 Last data filed at 02/22/2021 1000 Gross per 24 hour  Intake 2013.65 ml  Output 850 ml  Net 1163.65 ml    Filed Weights   02/15/21 2026 02/21/21 1156 02/22/21 0500  Weight: 53.1 kg 53.1 kg 50.8 kg    Examination: General exam: Conversant, in acute pain  while drinking contrast Respiratory system: normal chest rise, clear, no audible wheezing Cardiovascular system: regular rhythm, s1-s2 Gastrointestinal system: Nondistended, nontender, pos BS Central nervous system: No seizures, no tremors Extremities: No cyanosis, no joint deformities Skin: No rashes, no pallor Psychiatry: Affect normal // no auditory hallucinations   Data Reviewed: I have personally reviewed following labs and imaging studies  CBC: Recent Labs  Lab 02/15/21 2050 02/17/21 0452 02/18/21 0415 02/20/21 0433 02/21/21 0431 02/22/21 0441  WBC 13.0* 7.5 6.9 7.0 7.1 8.1  NEUTROABS 10.3*  --   --  3.9  --   --   HGB 14.3 11.3* 11.8* 12.1 12.6 12.7  HCT 44.7 36.4 36.3 37.1 38.7 38.8  MCV 80.8 83.7 82.3 80.8 80.8 82.4  PLT 275 199 232 242 241 629    Basic Metabolic Panel: Recent Labs  Lab 02/17/21 0452 02/18/21 0415 02/19/21 0439 02/20/21 0433 02/21/21 0431 02/22/21 0441  NA 137 135 137 139 140 137  K 3.5 3.7 3.7 3.7 3.6 3.7  CL 106 105 105 108 108 106  CO2 23 22 23 24 26 24   GLUCOSE 74 81 91 96 105* 142*  BUN 9 8 7 7 6 8   CREATININE 0.55 0.55 0.58 0.52 0.61 0.55  CALCIUM 8.6* 8.6* 8.9 8.6* 8.9 8.8*  MG 2.2 2.0 2.1  --   --   --   PHOS  --  2.4*  --   --   --   --     GFR: Estimated Creatinine Clearance: 89.2 mL/min (by C-G formula based on SCr of 0.55 mg/dL). Liver Function Tests: Recent Labs  Lab 02/15/21 2050 02/17/21 0452 02/18/21 0415 02/21/21 0431 02/22/21 0441  AST 17 20  --  13* 12*  ALT 16 25  --  24 19  ALKPHOS 49 41  --  47 41  BILITOT 0.5 1.0  --  0.6 0.6  PROT 7.2 5.9*  --  6.6 6.2*  ALBUMIN 4.5 3.4* 3.5 3.9 3.6    Recent Labs  Lab 02/15/21 2050  LIPASE 17    No results for input(s): AMMONIA in the last 168 hours. Coagulation Profile: No results for input(s): INR, PROTIME in the last 168 hours. Cardiac Enzymes: No results for input(s): CKTOTAL, CKMB, CKMBINDEX, TROPONINI in the last 168 hours. BNP (last 3 results) No  results for input(s): PROBNP in the last 8760 hours. HbA1C: No results for input(s): HGBA1C in the last 72 hours. CBG: No results for input(s): GLUCAP in the last 168 hours. Lipid Profile: No results for input(s): CHOL, HDL, LDLCALC, TRIG, CHOLHDL, LDLDIRECT in the last 72 hours. Thyroid Function Tests: No results for input(s): TSH, T4TOTAL, FREET4, T3FREE, THYROIDAB in the last 72 hours. Anemia Panel: No results for input(s): VITAMINB12, FOLATE, FERRITIN, TIBC, IRON, RETICCTPCT in the last 72 hours. Sepsis Labs: No results for input(s): PROCALCITON, LATICACIDVEN in the last 168 hours.  Recent Results (from the past 240 hour(s))  Resp Panel by RT-PCR (Flu A&B, Covid) Nasopharyngeal Swab     Status: None  Collection Time: 02/16/21 12:15 AM   Specimen: Nasopharyngeal Swab; Nasopharyngeal(NP) swabs in vial transport medium  Result Value Ref Range Status   SARS Coronavirus 2 by RT PCR NEGATIVE NEGATIVE Final    Comment: (NOTE) SARS-CoV-2 target nucleic acids are NOT DETECTED.  The SARS-CoV-2 RNA is generally detectable in upper respiratory specimens during the acute phase of infection. The lowest concentration of SARS-CoV-2 viral copies this assay can detect is 138 copies/mL. A negative result does not preclude SARS-Cov-2 infection and should not be used as the sole basis for treatment or other patient management decisions. A negative result may occur with  improper specimen collection/handling, submission of specimen other than nasopharyngeal swab, presence of viral mutation(s) within the areas targeted by this assay, and inadequate number of viral copies(<138 copies/mL). A negative result must be combined with clinical observations, patient history, and epidemiological information. The expected result is Negative.  Fact Sheet for Patients:  EntrepreneurPulse.com.au  Fact Sheet for Healthcare Providers:  IncredibleEmployment.be  This test is  no t yet approved or cleared by the [place] FDA and  has been authorized for detection and/or diagnosis of SARS-CoV-2 by FDA under an Emergency Use Authorization (EUA). This EUA will remain  in effect (meaning this test can be used) for the duration of the COVID-19 declaration under Section 564(b)(1) of the Act, 21 U.S.C.section 360bbb-3(b)(1), unless the authorization is terminated  or revoked sooner.       Influenza A by PCR NEGATIVE NEGATIVE Final   Influenza B by PCR NEGATIVE NEGATIVE Final    Comment: (NOTE) The Xpert Xpress SARS-CoV-2/FLU/RSV plus assay is intended as an aid in the diagnosis of influenza from Nasopharyngeal swab specimens and should not be used as a sole basis for treatment. Nasal washings and aspirates are unacceptable for Xpert Xpress SARS-CoV-2/FLU/RSV testing.  Fact Sheet for Patients: EntrepreneurPulse.com.au  Fact Sheet for Healthcare Providers: IncredibleEmployment.be  This test is not yet approved or cleared by the [place] FDA and has been authorized for detection and/or diagnosis of SARS-CoV-2 by FDA under an Emergency Use Authorization (EUA). This EUA will remain in effect (meaning this test can be used) for the duration of the COVID-19 declaration under Section 564(b)(1) of the Act, 21 U.S.C. section 360bbb-3(b)(1), unless the authorization is terminated or revoked.  Performed at [facility], [address], [place], [place] [address]       Radiology Studies: [name] EKG SITE RITE  Result Date: 02/22/2021 If Site Rite image not attached, placement could not be confirmed due to current cardiac rhythm.   Scheduled Meds:  feeding supplement  237 mL Oral TID WC   hydrocortisone   Rectal TID   liver oil-zinc oxide   Topical TID   mirtazapine  7.5 mg Oral QHS   pantoprazole (PROTONIX) IV  40 mg Intravenous Q12H   simethicone  80 mg Oral QID   Continuous Infusions:  sodium  chloride 125 mL/hr at 02/22/21 0744   ciprofloxacin 400 mg (02/22/21 1213)   metronidazole 500 mg (02/22/21 1039)     LOS: 6 days   Marylu Lund, MD [facility] Pager On Amion  If 7PM-7AM, please contact night-coverage 02/22/2021, 1:57 PM

## 2021-02-22 NOTE — Progress Notes (Addendum)
Peripherally Inserted Central Catheter Placement  The IV Nurse has discussed with the patient and/or persons authorized to consent for the patient, the purpose of this procedure and the potential benefits and risks involved with this procedure.  The benefits include less needle sticks, lab draws from the catheter, and the patient may be discharged home with the catheter. Risks include, but not limited to, infection, bleeding, blood clot (thrombus formation), and puncture of an artery; nerve damage and irregular heartbeat and possibility to perform a PICC exchange if needed/ordered by physician.  Alternatives to this procedure were also discussed.  Bard Power PICC patient education guide, fact sheet on infection prevention and patient information card has been provided to patient /or left at bedside.  [place] 3CG confirmation image printed and placed in chart.   PICC Placement Documentation  PICC Double Lumen 02/22/21 PICC Right Brachial 36 cm 0 cm (Active)  Indication for Insertion or Continuance of Line Limited venous access - need for IV therapy >5 days (PICC only) 02/22/21 1555  Exposed Catheter (cm) 0 cm 02/22/21 1555  Site Assessment Clean;Dry;Intact 02/22/21 1555  Lumen #1 Status Flushed;Saline locked;Blood return noted 02/22/21 1555  Lumen #2 Status Flushed;Saline locked;Blood return noted 02/22/21 1555  Dressing Type Transparent;Securing device 02/22/21 1555  Dressing Status Dry;Clean;Intact 02/22/21 1555  Antimicrobial disc in place? Yes 02/22/21 1555  Safety Lock Not Applicable 49/44/96 7591  Dressing Change Due 03/01/21 02/22/21 [place] 02/22/2021, 4:00 PM

## 2021-02-23 LAB — COMPREHENSIVE METABOLIC PANEL
ALT: 14 U/L (ref 0–44)
AST: 9 U/L — ABNORMAL LOW (ref 15–41)
Albumin: 3.3 g/dL — ABNORMAL LOW (ref 3.5–5.0)
Alkaline Phosphatase: 38 U/L (ref 38–126)
Anion gap: 8 (ref 5–15)
BUN: 6 mg/dL (ref 6–20)
CO2: 26 mmol/L (ref 22–32)
Calcium: 8.3 mg/dL — ABNORMAL LOW (ref 8.9–10.3)
Chloride: 105 mmol/L (ref 98–111)
Creatinine, Ser: 0.6 mg/dL (ref 0.44–1.00)
GFR, Estimated: >60 mL/min (ref >60)
Glucose, Bld: 100 mg/dL — ABNORMAL HIGH (ref 70–99)
Potassium: 3.2 mmol/L — ABNORMAL LOW (ref 3.5–5.1)
Sodium: 139 mmol/L (ref 135–145)
Total Bilirubin: 0.5 mg/dL (ref 0.3–1.2)
Total Protein: 5.5 g/dL — ABNORMAL LOW (ref 6.5–8.1)

## 2021-02-23 LAB — CBC
HCT: 35.9 % — ABNORMAL LOW (ref 36.0–46.0)
Hemoglobin: 11.5 g/dL — ABNORMAL LOW (ref 12.0–15.0)
MCH: 27 pg (ref 26.0–34.0)
MCHC: 32 g/dL (ref 30.0–36.0)
MCV: 84.3 fL (ref 80.0–100.0)
Platelets: 214 10*3/uL (ref 150–400)
RBC: 4.26 MIL/uL (ref 3.87–5.11)
RDW: 13.6 % (ref 11.5–15.5)
WBC: 8 10*3/uL (ref 4.0–10.5)
nRBC: 0 % (ref 0.0–0.2)

## 2021-02-23 MED ORDER — POTASSIUM CHLORIDE CRYS ER 20 MEQ PO TBCR
40.0000 meq | EXTENDED_RELEASE_TABLET | Freq: Four times a day (QID) | ORAL | Status: AC
  Administered 2021-02-23 (×2): 40 meq via ORAL
  Filled 2021-02-23: qty 2

## 2021-02-23 NOTE — Progress Notes (Signed)
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  Patient noted to have undergoing anesthesia for excision of desmoid tumors on 02/14/2021 and noted following procedure and not had any bowel movement or flatus which was unusual as patient noted to have 5-6 bowel movements daily typically an hour after eating.  Patient presented worsening abdominal pain, nausea, vomiting and noted to be dehydrated.  CT scan done concerning for small bowel obstruction versus ileus.  Patient seen in the ED and attempts were made to transfer patient to [place] (patient general surgeon and gastroenterologist) but due to bed availability patient subsequently admitted to [facility], general surgery consulted for further evaluation.  Patient placed on bowel rest, IV fluids, pain management, supportive care.  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 -Patient seen in consultation by general surgery. -Patient with continued complaints of abdominal pain and rectal pain with bowel movements that feel like burning sensation/reflux. -continue analgesia as needed, presently on po oxy and IV dilaudid prn -General Surgery following. Now s/p exam under anesthesia  12/15. Per General Surgery, recommendation for continuing cipro/flagyl -Pt and family reports 4lb wt loss. Limited PO intake. Have consulted dietitian to establish nutritional status  [name] -Outpatient follow-up.    DVT prophylaxis: SCD's Code Status: Full Family Communication: Pt in room, family at bedside  Status is: Inpatient  Remains inpatient appropriate because: Severity of illness    Consultants:  General Surgery  Procedures:  Exam under anesthesia 02/21/21  Antimicrobials: Anti-infectives (From admission, onward)    Start     Dose/Rate Route Frequency Ordered Stop   02/22/21 1000  ciprofloxacin (CIPRO) IVPB 400 mg        400 mg 200 mL/hr over 60 Minutes Intravenous Every 12 hours 02/22/21 0943     02/22/21 1000  metroNIDAZOLE (FLAGYL) IVPB 500 mg        500 mg 100 mL/hr over 60 Minutes Intravenous Every 12 hours 02/22/21 0944     02/21/21 0600  cefoTEtan (CEFOTAN) 2 g in sodium chloride 0.9 % 100 mL IVPB       Note to Pharmacy: Pharmacy may adjust dose strength for optimal dosing.   Send with patient on call to the OR.  Anesthesia to complete antibiotic administration <62min prior to incision per [facility].   2 g 200 mL/hr over 30 Minutes Intravenous On call to O.R. 02/20/21 1635 02/21/21 1336       Subjective: Still complaining of abd discomfort  Objective: Vitals:   02/22/21 1344 02/22/21 2157 02/23/21 0536 02/23/21 1327  BP: 105/60 109/61 (!) 109/46 (!) 108/57  Pulse: 66 (!) 56 (!) 53 (!) 58  Resp: 18 18 18 16   Temp: 98.4 F (36.9 C) 98.1 F (36.7 C) 97.7 F (36.5 C) 98.4 F (36.9 C)  TempSrc: Oral Oral Oral Oral  SpO2: 98% 97% 98% 99%  Weight:   52.1 kg   Height:        Intake/Output Summary (Last 24 hours) at 02/23/2021 1448 Last data filed at 02/23/2021 0749 Gross per 24 hour  Intake 2523.88 ml  Output 1601 ml  Net 922.88 ml    Filed Weights   02/21/21 1156 02/22/21 0500 02/23/21 0536  Weight: 53.1 kg 50.8 kg 52.1 kg     Examination: General exam: Awake, laying in bed, in nad Respiratory system: Normal respiratory effort, no wheezing Cardiovascular system: regular rate, s1, s2 Gastrointestinal system: Soft, nondistended, positive BS Central nervous system: CN2-12 grossly intact, strength intact Extremities: Perfused, no clubbing Skin: Normal skin turgor, no notable skin lesions seen Psychiatry: Mood normal // no visual hallucinations   Data Reviewed: I have personally reviewed following labs and imaging studies  CBC: Recent Labs  Lab 02/18/21 0415 02/20/21 0433 02/21/21 0431 02/22/21 0441 02/23/21 0412  WBC 6.9 7.0 7.1 8.1 8.0  NEUTROABS  --  3.9  --   --   --   HGB 11.8* 12.1 12.6 12.7 11.5*  HCT 36.3 37.1 38.7 38.8 35.9*  MCV 82.3 80.8 80.8 82.4 84.3  PLT 232 242 241 246 818    Basic Metabolic Panel: Recent Labs  Lab 02/17/21 0452 02/18/21 0415 02/19/21 0439 02/20/21 0433 02/21/21 0431 02/22/21 0441 02/23/21 0412  NA 137 135 137 139 140 137 139  K 3.5 3.7 3.7 3.7 3.6 3.7 3.2*  CL 106 105 105 108 108 106 105  CO2 23 22 23 24 26 24 26   GLUCOSE 74 81 91 96 105* 142* 100*  BUN 9 8 7 7 6 8 6   CREATININE 0.55 0.55 0.58 0.52 0.61 0.55 0.60  CALCIUM 8.6* 8.6* 8.9 8.6* 8.9 8.8* 8.3*  MG 2.2 2.0 2.1  --   --   --   --   PHOS  --  2.4*  --   --   --   --   --     GFR: Estimated Creatinine Clearance: 91.5 mL/min (by C-G formula based on SCr of 0.6 mg/dL). Liver Function Tests: Recent Labs  Lab 02/17/21 0452 02/18/21 0415 02/21/21 0431 02/22/21 0441 02/23/21 0412  AST 20  --  13* 12* 9*  ALT 25  --  24 19 14   ALKPHOS 41  --  47 41 38  BILITOT 1.0  --  0.6 0.6 0.5  PROT 5.9*  --  6.6 6.2* 5.5*  ALBUMIN 3.4* 3.5 3.9 3.6 3.3*    No results for input(s): LIPASE, AMYLASE in the last 168 hours.  No results for input(s): AMMONIA in the last 168 hours. Coagulation Profile: No results for input(s): INR, PROTIME in the last 168 hours. Cardiac Enzymes: No results for  input(s): CKTOTAL, CKMB, CKMBINDEX, TROPONINI in the last 168 hours. BNP (last 3 results) No results for input(s): PROBNP in the last 8760 hours. HbA1C: No results for input(s): HGBA1C in the last 72 hours. CBG: No results for input(s): GLUCAP in the last 168 hours. Lipid Profile: No results for input(s): CHOL, HDL, LDLCALC, TRIG, CHOLHDL, LDLDIRECT in the last 72 hours. Thyroid Function Tests: No results for input(s): TSH, T4TOTAL, FREET4, T3FREE, THYROIDAB in the last 72 hours. Anemia Panel: No results for input(s): VITAMINB12, FOLATE, FERRITIN, TIBC, IRON, RETICCTPCT in the last 72 hours. Sepsis Labs: No results for input(s): PROCALCITON, LATICACIDVEN in the last 168 hours.  Recent Results (from the past 240 hour(s))  Resp Panel by RT-PCR (Flu  A&B, Covid) Nasopharyngeal Swab     Status: None   Collection Time: 02/16/21 12:15 AM   Specimen: Nasopharyngeal Swab; Nasopharyngeal(NP) swabs in vial transport medium  Result Value Ref Range Status   SARS Coronavirus 2 by RT PCR NEGATIVE NEGATIVE Final    Comment: (NOTE) SARS-CoV-2 target nucleic acids are NOT DETECTED.  The SARS-CoV-2 RNA is generally detectable in upper respiratory specimens during the acute phase of infection. The lowest concentration of SARS-CoV-2 viral copies this assay can detect is 138 copies/mL. A negative result does not preclude SARS-Cov-2 infection and should not be used as the sole basis for treatment or other patient management decisions. A negative result may occur with  improper specimen collection/handling, submission of specimen other than nasopharyngeal swab, presence of viral mutation(s) within the areas targeted by this assay, and inadequate number of viral copies(<138 copies/mL). A negative result must be combined with clinical observations, patient history, and epidemiological information. The expected result is Negative.  Fact Sheet for Patients:   EntrepreneurPulse.com.au  Fact Sheet for Healthcare Providers:  IncredibleEmployment.be  This test is no t yet approved or cleared by the [place] FDA and  has been authorized for detection and/or diagnosis of SARS-CoV-2 by FDA under an Emergency Use Authorization (EUA). This EUA will remain  in effect (meaning this test can be used) for the duration of the COVID-19 declaration under Section 564(b)(1) of the Act, 21 U.S.C.section 360bbb-3(b)(1), unless the authorization is terminated  or revoked sooner.       Influenza A by PCR NEGATIVE NEGATIVE Final   Influenza B by PCR NEGATIVE NEGATIVE Final    Comment: (NOTE) The Xpert Xpress SARS-CoV-2/FLU/RSV plus assay is intended as an aid in the diagnosis of influenza from Nasopharyngeal swab specimens and should not be used as a sole basis for treatment. Nasal washings and aspirates are unacceptable for Xpert Xpress SARS-CoV-2/FLU/RSV testing.  Fact Sheet for Patients: EntrepreneurPulse.com.au  Fact Sheet for Healthcare Providers: IncredibleEmployment.be  This test is not yet approved or cleared by the [place] FDA and has been authorized for detection and/or diagnosis of SARS-CoV-2 by FDA under an Emergency Use Authorization (EUA). This EUA will remain in effect (meaning this test can be used) for the duration of the COVID-19 declaration under Section 564(b)(1) of the Act, 21 U.S.C. section 360bbb-3(b)(1), unless the authorization is terminated or revoked.  Performed at [facility], [address] [place], [place] [address]       Radiology Studies: CT ABDOMEN PELVIS W CONTRAST  Result Date: 02/22/2021 CLINICAL DATA:  Abdominal pain EXAM: CT ABDOMEN AND PELVIS WITH CONTRAST TECHNIQUE: Multidetector CT imaging of the abdomen and pelvis was performed using the standard protocol following bolus administration of intravenous  contrast. CONTRAST:  80mL OMNIPAQUE IOHEXOL 350 MG/ML SOLN COMPARISON:  02/15/2021 FINDINGS: Lower chest: Unremarkable. Hepatobiliary: There is 6 mm low-attenuation in the right lobe of liver which has not changed, possibly a cyst. There is no dilation of bile ducts. Gallbladder is not distended. Pancreas: No focal abnormality is seen. Spleen: Unremarkable. Adrenals/Urinary Tract: Adrenals are not enlarged. There is no hydronephrosis. There is 10 mm low-density structure in the midportion of left kidney with density measurements higher than usual for simple cyst. Higher than usual density measurement could easily be normal variation due to intravenous contrast and dense enhancement of adjacent renal cortex. Follow-up renal sonogram in outpatient setting may be considered. There is contrast in the pelvocaliceal systems limiting evaluation for small stones. In the previous study, no definite  renal stone was observed. There are no calcific densities in the courses of the ureters. Urinary bladder is unremarkable. Stomach/Bowel: Stomach is not distended. Small bowel loops are not dilated. There is evidence of subtotal colectomy with ileosigmoid anastomosis. Oral contrast has reached rectosigmoid. Dilation of few small bowel loops seen in the lower abdomen in the previous study could not be identified in the current study. Vascular/Lymphatic: Unremarkable. Reproductive: Uterus is to the left of midline. There are no dominant adnexal masses. Small amount of free fluid is seen in the pelvis. Other: There is no ascites or pneumoperitoneum. Musculoskeletal: Unremarkable. IMPRESSION: There is no evidence of intestinal obstruction or pneumoperitoneum. There is no hydronephrosis. Previous subtotal colectomy. Dilation of distal ileum seen in the previous examination could not be identified in the current study. Oral contrast has reached the rectum. Possible small hepatic cyst. There is 10 mm smooth marginated low-density lesion  in the left kidney with density measurements higher than usual for simple cyst. Follow-up renal sonogram in the outpatient setting should be considered to re-evaluate this finding. Electronically Signed   By: Elmer Picker M.D.   On: 02/22/2021 18:31   [name] EKG Site Rite  Result Date: 02/22/2021 If Site Rite image not attached, placement could not be confirmed due to current cardiac rhythm.  [name] EKG SITE RITE  Result Date: 02/22/2021 If Site Rite image not attached, placement could not be confirmed due to current cardiac rhythm.   Scheduled Meds:  Chlorhexidine Gluconate Cloth  6 each Topical Daily   feeding supplement  237 mL Oral TID WC   hydrocortisone   Rectal TID   liver oil-zinc oxide   Topical TID   mirtazapine  7.5 mg Oral QHS   pantoprazole (PROTONIX) IV  40 mg Intravenous Q12H   sodium chloride flush  10-40 mL Intracatheter Q12H   Continuous Infusions:  sodium chloride 125 mL/hr at 02/23/21 0954   ciprofloxacin 400 mg (02/23/21 0817)   metronidazole 500 mg (02/23/21 0954)     LOS: 7 days   Marylu Lund, MD [facility] Pager On Amion  If 7PM-7AM, please contact night-coverage 02/23/2021, 2:48 PM

## 2021-02-23 NOTE — Progress Notes (Signed)
2 Days Post-Op   Subjective/Chief Complaint: Still complains of pain and bloating. Having bm's   Objective: Vital signs in last 24 hours: Temp:  [97.7 F (36.5 C)-98.4 F (36.9 C)] 97.7 F (36.5 C) (12/17 0536) Pulse Rate:  [53-66] 53 (12/17 0536) Resp:  [18] 18 (12/17 0536) BP: (105-115)/(46-61) 109/46 (12/17 0536) SpO2:  [97 %-98 %] 98 % (12/17 0536) Weight:  [52.1 kg] 52.1 kg (12/17 0536) Last BM Date: 02/22/21  Intake/Output from previous day: 12/16 0701 - 12/17 0700 In: 2523.9 [P.O.:540; I.V.:1664.3; IV Piggyback:319.5] Out: 1651 [Urine:1650; Stool:1] Intake/Output this shift: Total I/O In: -  Out: 800 [Urine:800]  General appearance: alert and cooperative Resp: clear to auscultation bilaterally Cardio: regular rate and rhythm GI: soft, moderate diffuse tenderness  Lab Results:  Recent Labs    02/22/21 0441 02/23/21 0412  WBC 8.1 8.0  HGB 12.7 11.5*  HCT 38.8 35.9*  PLT 246 214   BMET Recent Labs    02/22/21 0441 02/23/21 0412  NA 137 139  K 3.7 3.2*  CL 106 105  CO2 24 26  GLUCOSE 142* 100*  BUN 8 6  CREATININE 0.55 0.60  CALCIUM 8.8* 8.3*   PT/INR No results for input(s): LABPROT, INR in the last 72 hours. ABG No results for input(s): PHART, HCO3 in the last 72 hours.  Invalid input(s): PCO2, PO2  Studies/Results: CT ABDOMEN PELVIS W CONTRAST  Result Date: 02/22/2021 CLINICAL DATA:  Abdominal pain EXAM: CT ABDOMEN AND PELVIS WITH CONTRAST TECHNIQUE: Multidetector CT imaging of the abdomen and pelvis was performed using the standard protocol following bolus administration of intravenous contrast. CONTRAST:  42mL OMNIPAQUE IOHEXOL 350 MG/ML SOLN COMPARISON:  02/15/2021 FINDINGS: Lower chest: Unremarkable. Hepatobiliary: There is 6 mm low-attenuation in the right lobe of liver which has not changed, possibly a cyst. There is no dilation of bile ducts. Gallbladder is not distended. Pancreas: No focal abnormality is seen. Spleen: Unremarkable.  Adrenals/Urinary Tract: Adrenals are not enlarged. There is no hydronephrosis. There is 10 mm low-density structure in the midportion of left kidney with density measurements higher than usual for simple cyst. Higher than usual density measurement could easily be normal variation due to intravenous contrast and dense enhancement of adjacent renal cortex. Follow-up renal sonogram in outpatient setting may be considered. There is contrast in the pelvocaliceal systems limiting evaluation for small stones. In the previous study, no definite renal stone was observed. There are no calcific densities in the courses of the ureters. Urinary bladder is unremarkable. Stomach/Bowel: Stomach is not distended. Small bowel loops are not dilated. There is evidence of subtotal colectomy with ileosigmoid anastomosis. Oral contrast has reached rectosigmoid. Dilation of few small bowel loops seen in the lower abdomen in the previous study could not be identified in the current study. Vascular/Lymphatic: Unremarkable. Reproductive: Uterus is to the left of midline. There are no dominant adnexal masses. Small amount of free fluid is seen in the pelvis. Other: There is no ascites or pneumoperitoneum. Musculoskeletal: Unremarkable. IMPRESSION: There is no evidence of intestinal obstruction or pneumoperitoneum. There is no hydronephrosis. Previous subtotal colectomy. Dilation of distal ileum seen in the previous examination could not be identified in the current study. Oral contrast has reached the rectum. Possible small hepatic cyst. There is 10 mm smooth marginated low-density lesion in the left kidney with density measurements higher than usual for simple cyst. Follow-up renal sonogram in the outpatient setting should be considered to re-evaluate this finding. Electronically Signed   By: Elmer Picker  M.D.   On: 02/22/2021 18:31   [name] EKG Site Rite  Result Date: 02/22/2021 If Site Rite image not attached, placement could not be  confirmed due to current cardiac rhythm.  [name] EKG SITE RITE  Result Date: 02/22/2021 If Site Rite image not attached, placement could not be confirmed due to current cardiac rhythm.   Anti-infectives: Anti-infectives (From admission, onward)    Start     Dose/Rate Route Frequency Ordered Stop   02/22/21 1000  ciprofloxacin (CIPRO) IVPB 400 mg        400 mg 200 mL/hr over 60 Minutes Intravenous Every 12 hours 02/22/21 0943     02/22/21 1000  metroNIDAZOLE (FLAGYL) IVPB 500 mg        500 mg 100 mL/hr over 60 Minutes Intravenous Every 12 hours 02/22/21 0944     02/21/21 0600  cefoTEtan (CEFOTAN) 2 g in sodium chloride 0.9 % 100 mL IVPB       Note to Pharmacy: Pharmacy may adjust dose strength for optimal dosing.   Send with patient on call to the OR.  Anesthesia to complete antibiotic administration <28min prior to incision per [facility].   2 g 200 mL/hr over 30 Minutes Intravenous On call to O.R. 02/20/21 1635 02/21/21 1336       Assessment/Plan: s/p Procedure(s): EXAM UNDER ANESTHESIA WITH ANAL FISTULECTOMY (N/A) Advance diet slowly Pouchitis. 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  Presented with abdominal pain and obstructive symptoms     - now having bowel function, interval improvement in abd distention - continue SOFT/ensure and monitor; unable to tolerate enough PO to be safely discharged at this point. - still having a lot of pain with BMs, requiring IV dilaudid. Dr. Eliseo Squires making adjustments to her pain meds. Unsure the etiology of her pain - it is acute on chronic, along with her nausea. No hemorrhoids. J pouch seems to be emptying, but with pain. Could have recurrent pouchitis without signs on CT. - plan to repeat CT with oral contrast today, start cipro / flagyl for possible pouchitis     No sign of pouch obstruction otherwise       - no emergent surgical needs    - unfortunately  present interventions have not helped       - I think this patient would be best served at [facility] under the care of a  colorectal surgeons and GI J-pouch specialist.    We have reached out to her  primary surgical team . We have followed their recommendations so far.    Try the above and reassess    LOS: 7 days    Lisa Donovan 02/23/2021

## 2021-02-23 NOTE — Progress Notes (Addendum)
Patient's mother came to nurses station because patient passed out on the toilet. Went to the room to assess patient, she came to and started crying saying she is in pain. Patient ambulated back to bed with stand by assist. NT got patient's VS.

## 2021-02-24 LAB — CBC
HCT: 38.6 % (ref 36.0–46.0)
Hemoglobin: 12.1 g/dL (ref 12.0–15.0)
MCH: 26.6 pg (ref 26.0–34.0)
MCHC: 31.3 g/dL (ref 30.0–36.0)
MCV: 84.8 fL (ref 80.0–100.0)
Platelets: 231 10*3/uL (ref 150–400)
RBC: 4.55 MIL/uL (ref 3.87–5.11)
RDW: 13.7 % (ref 11.5–15.5)
WBC: 7.9 10*3/uL (ref 4.0–10.5)
nRBC: 0 % (ref 0.0–0.2)

## 2021-02-24 LAB — COMPREHENSIVE METABOLIC PANEL
ALT: 13 U/L (ref 0–44)
AST: 10 U/L — ABNORMAL LOW (ref 15–41)
Albumin: 3.4 g/dL — ABNORMAL LOW (ref 3.5–5.0)
Alkaline Phosphatase: 40 U/L (ref 38–126)
Anion gap: 8 (ref 5–15)
BUN: <5 mg/dL — ABNORMAL LOW (ref 6–20)
CO2: 25 mmol/L (ref 22–32)
Calcium: 8.8 mg/dL — ABNORMAL LOW (ref 8.9–10.3)
Chloride: 105 mmol/L (ref 98–111)
Creatinine, Ser: 0.6 mg/dL (ref 0.44–1.00)
GFR, Estimated: >60 mL/min (ref >60)
Glucose, Bld: 98 mg/dL (ref 70–99)
Potassium: 3.6 mmol/L (ref 3.5–5.1)
Sodium: 138 mmol/L (ref 135–145)
Total Bilirubin: 0.5 mg/dL (ref 0.3–1.2)
Total Protein: 5.9 g/dL — ABNORMAL LOW (ref 6.5–8.1)

## 2021-02-24 MED ORDER — POTASSIUM CHLORIDE CRYS ER 20 MEQ PO TBCR: 40.0000 meq | EXTENDED_RELEASE_TABLET | Freq: Once | ORAL | Status: DC

## 2021-02-24 MED ORDER — ADULT MULTIVITAMIN W/MINERALS CH
1.0000 | ORAL_TABLET | Freq: Every day | ORAL | Status: DC
  Filled 2021-02-24: qty 1

## 2021-02-24 NOTE — Progress Notes (Signed)
Pharmacy Brief Note -   Pharmacy consulted to manage TPN. Consult received after noon deadline, TPN will not begin today. Labs ordered for tomorrow morning.  Lenis Noon, PharmD 02/24/21 7:35 PM

## 2021-02-24 NOTE — Progress Notes (Addendum)
Initial Nutrition Assessment  INTERVENTION:   Recommend initiation of TPN given suspected continued severe malnutrition, and poor PO since admission (12/9 -9 days ago). Already with PICC line.  -Calorie Count placed to better see how much pt is able to take in PO.  -Ensure Enlive po TID, each supplement provides 350 kcal and 20 grams of protein-as tolerated  -Multivitamin with minerals daily  NUTRITION DIAGNOSIS:   Increased nutrient needs related to chronic illness as evidenced by estimated needs.  GOAL:   Patient will meet greater than or equal to 90% of their needs  MONITOR:   PO intake, Supplement acceptance, Labs, Weight trends, I & O's  REASON FOR ASSESSMENT:   Consult Assessment of nutrition requirement/status  ASSESSMENT:   20 year old female history of [name], [name] status post proctocolectomy with J-pouch June 2022 with diverting loop ileostomy reversal August 2022.  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  Patient noted to have undergoing anesthesia for excision of desmoid tumors on 02/14/2021 and noted following procedure and not had any bowel movement or flatus which was unusual as patient noted to have 5-6 bowel movements daily typically an hour after eating.  Patient presented worsening abdominal pain, nausea, vomiting and noted to be dehydrated.  12/9: admitted 12/15: s/p EXAM UNDER ANESTHESIA WITH ANAL FISTULECTOMY   This is LOS day 8 for patient. Pt was having N/V and constipation PTA.  Per chart review, pt was placed on TPN prior to ileostomy takedown in August 2022. Pt currently on soft diet, consuming 10-100% of meals yesterday and 0-75% of meals today. Ensure has been ordered for patient but only drinking about 1 daily. Will continue.  **Addendum: Spoke with pt's mother on the phone. Pt's mother reports pt has not  been eating well this admission d/t procedures, diet changes, chronic nausea and pt's inability to take in much as far as volume. States the pt gets full on kid's meals. Pt has been on TPN in the past and pt's mother felt this helped her recover much better so they are in favor of resuming TPN if needed.  Pt consuming small amounts of Ensure, mini pancakes, mashed potatoes, mac and cheese and milkshakes, no meats or protein foods consumed.   Per weight records, pt's weight has increased since July. Pt's mother reports pt weighing ~118 lbs prior to admission but now weighs 114 lbs.  Medications: Remeron, KLOR-CON, Zofran  Labs reviewed.  NUTRITION - FOCUSED PHYSICAL EXAM:  Unable to complete -working remotely.  Diet Order:   Diet Order             DIET SOFT Room service appropriate? Yes; Fluid consistency: Thin  Diet effective now                   EDUCATION NEEDS:   No education needs have been identified at this time  Skin:  Skin Assessment: Reviewed RN Assessment  Last BM:  12/18 -type 4  Height:   Ht Readings from Last 1 Encounters:  02/21/21 _0  (1.651 m)    Weight:   Wt Readings from Last 1 Encounters:  02/23/21 52.1 kg    BMI:  Body mass index is 19.11 kg/m.  Estimated Nutritional Needs:   Kcal:  1650-1850  Protein:  85-100g  Fluid:  1.9L/day  Clayton Bibles, MS, RD, LDN Inpatient Clinical Dietitian Contact information available via Amion

## 2021-02-24 NOTE — Progress Notes (Signed)
Yesterday, patient C/O tenderness & swelling on under the Rt. UA, not PICC insertion area. Advised to patient & mother to apply ice bag & hot bag alternatively, not more than 15 min since patient had a PICC line on Friday. Rechecked patient stated that remained tenderness & swelling. Measured Rt. UA circumference which was 24 cm. Continue to monitor it and informed oncoming night shift IV team. HS Truman Hayward RN

## 2021-02-25 LAB — CBC
HCT: 40.8 % (ref 36.0–46.0)
Hemoglobin: 12.9 g/dL (ref 12.0–15.0)
MCH: 26.5 pg (ref 26.0–34.0)
MCHC: 31.6 g/dL (ref 30.0–36.0)
MCV: 84 fL (ref 80.0–100.0)
Platelets: 233 10*3/uL (ref 150–400)
RBC: 4.86 MIL/uL (ref 3.87–5.11)
RDW: 13.5 % (ref 11.5–15.5)
WBC: 8.2 10*3/uL (ref 4.0–10.5)
nRBC: 0 % (ref 0.0–0.2)

## 2021-02-25 LAB — COMPREHENSIVE METABOLIC PANEL
ALT: 14 U/L (ref 0–44)
AST: 12 U/L — ABNORMAL LOW (ref 15–41)
Albumin: 3.7 g/dL (ref 3.5–5.0)
Alkaline Phosphatase: 42 U/L (ref 38–126)
Anion gap: 6 (ref 5–15)
BUN: 5 mg/dL — ABNORMAL LOW (ref 6–20)
CO2: 23 mmol/L (ref 22–32)
Calcium: 8.9 mg/dL (ref 8.9–10.3)
Chloride: 108 mmol/L (ref 98–111)
Creatinine, Ser: 0.49 mg/dL (ref 0.44–1.00)
GFR, Estimated: >60 mL/min (ref >60)
Glucose, Bld: 111 mg/dL — ABNORMAL HIGH (ref 70–99)
Potassium: 3.4 mmol/L — ABNORMAL LOW (ref 3.5–5.1)
Sodium: 137 mmol/L (ref 135–145)
Total Bilirubin: 0.6 mg/dL (ref 0.3–1.2)
Total Protein: 6.2 g/dL — ABNORMAL LOW (ref 6.5–8.1)

## 2021-02-25 LAB — PREALBUMIN: Prealbumin: 25.3 mg/dL (ref 18–38)

## 2021-02-25 LAB — GLUCOSE, CAPILLARY: Glucose-Capillary: 103 mg/dL — ABNORMAL HIGH (ref 70–99)

## 2021-02-25 LAB — MAGNESIUM: Magnesium: 2 mg/dL (ref 1.7–2.4)

## 2021-02-25 LAB — PHOSPHORUS: Phosphorus: 4.2 mg/dL (ref 2.5–4.6)

## 2021-02-25 LAB — TRIGLYCERIDES: Triglycerides: 64 mg/dL (ref <150)

## 2021-02-25 MED ORDER — SODIUM CHLORIDE 0.9 % IV SOLN: INTRAVENOUS | Status: DC

## 2021-02-25 MED ORDER — TRAVASOL 10 % IV SOLN
INTRAVENOUS | Status: AC
  Filled 2021-02-25: qty 480

## 2021-02-25 MED ORDER — POTASSIUM CHLORIDE 10 MEQ/100ML IV SOLN
10.0000 meq | INTRAVENOUS | Status: AC
  Administered 2021-02-25 (×4): 10 meq via INTRAVENOUS
  Filled 2021-02-25 (×4): qty 100

## 2021-02-25 MED ORDER — POTASSIUM CHLORIDE CRYS ER 20 MEQ PO TBCR
40.0000 meq | EXTENDED_RELEASE_TABLET | Freq: Once | ORAL | Status: DC
  Filled 2021-02-25: qty 2

## 2021-02-25 MED ORDER — INSULIN ASPART 100 UNIT/ML IJ SOLN
0.0000 [IU] | Freq: Four times a day (QID) | INTRAMUSCULAR | Status: DC
  Administered 2021-02-26 – 2021-02-28 (×5): 1 [IU] via SUBCUTANEOUS
  Administered 2021-02-28: 2 [IU] via SUBCUTANEOUS

## 2021-02-25 NOTE — Progress Notes (Signed)
Mobility Specialist - Progress Note    02/25/21 1611  Therapy Vitals  Pulse Rate 82  BP 99/72  Patient Position (if appropriate) Standing  Oxygen Therapy  O2 Device Room Air  Mobility  Activity Stood at bedside;Ambulated to bathroom  Level of Assistance Independent  Assistive Device None  Distance Ambulated (ft) 10 ft  Mobility Out of bed for toileting;Ambulated independently in room  Mobility Response Tolerated well  Mobility performed by Mobility specialist  $Mobility charge 1 Mobility   Upon entry pt was agreeable to mobilize at bedside. BP taken while pt sat at EOB, stood at EOB, and after marched in place for 2 minutes. Pt c/o of dizziness during session. No reports of pain made. Pt refused ambulation due to previous syncope episodes. Pt was encouraged to complete OOB activities whenever possible. At end of session, pt requested to use bathroom and was left in bathroom with family in room.   Sitting: 66 HR, 102/72 BP Initial Standing: 82 HR, 99/72 BP Standing for 3 minutes: 87 HR, 107/72 BP Post-Activity: 79 HR, 120/77

## 2021-02-25 NOTE — Progress Notes (Signed)
Nutrition Follow-up  DOCUMENTATION CODES:   Non-severe (moderate) malnutrition in context of chronic illness  INTERVENTION:  - continue Ensure Enlive TID. - continue to encourage PO intakes. - TPN initiation and management per Pharmacist.  Monitor magnesium, potassium, and phosphorus BID for at least 3 days, MD to replete as needed, as pt is at risk for refeeding syndrome given moderate malnutrition, inadequate PO intake for >2 weeks.   NUTRITION DIAGNOSIS:   Moderate Malnutrition related to chronic illness as evidenced by moderate fat depletion, moderate muscle depletion. -revised, ongoing  GOAL:   Patient will meet greater than or equal to 90% of their needs -ongoing  MONITOR:   PO intake, Supplement acceptance, Labs, Weight trends, I & O's, Other (Comment) (TPN regimen)  REASON FOR ASSESSMENT:   Consult Calorie Count, New TPN/TNA  ASSESSMENT:   20 year old female history of [name], [name] status post proctocolectomy with J-pouch June 2022 with diverting loop ileostomy reversal August 2022.  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  Patient noted to have undergoing anesthesia for excision of desmoid tumors on 02/14/2021 and noted following procedure and not had any bowel movement or flatus which was unusual as patient noted to have 5-6 bowel movements daily typically an hour after eating.  Patient presented worsening abdominal pain, nausea, vomiting and noted to be dehydrated.  Patient assessed remotely by another RD yesterday who was able to talk with patient's mom on the phone at that time.   This RD able to talk with patient and mom at bedside this AM. Patient has double lumen PICC which was placed in R brachial on 12/16. Order currently in place for custom TPN to start at 40 ml/hr tonight.   Able to communicate with Pharmacist via  secure chat following visit to patient's room.   Mom concerned about volume of urine output; informed MD of concern.   Patient shares that she has been weak and has difficulty ambulating from the bed to the bathroom. She has been walking at least once/day with family support. Usually short distances.   Patient and mom recount previous hospitalization in the summer and that patient required 6 weeks of cyclic home TPN after that admission.   Patient continues with poor appetite and intakes since PTA with limited food acceptance d/t persistent nausea and discomfort with eating. During previous hospitalization she experienced vomiting with protein foods so she has been hesitant to try any protein-rich foods during this hospitalization; accepting limited food options.   Per review of order, she has been accepting Ensure supplements ~30-40% of the time offered. She has not had anything PO this AM.  Mom and patient share that PTA she was having episodes of passing out and that these episodes become more frequent when she is dehydrated.   Patient shares that during previous hospitalization she gained 10 lb of fluid.   Weight on 12/9 was documented as 117 lb and weight on 12/17 (most recently documented weight) was 115 lb.   She is noted to be +16.6 L since admission. No edema noted during NFPE.     Labs reviewed; K: 3.4 mmol/l, BUN: 5 mg/dl. Medications reviewed; 40 mg IV protonix BID, 40 mEq Klor-Con x1 dose 12/18. IVF; NS @ 125 ml/hr.     NUTRITION - FOCUSED PHYSICAL EXAM:  Flowsheet Row Most Recent Value  Orbital Region Mild depletion  Upper Arm Region Moderate  depletion  Thoracic and Lumbar Region Unable to assess  Buccal Region Moderate depletion  Temple Region Mild depletion  Clavicle Bone Region Moderate depletion  Clavicle and Acromion Bone Region Moderate depletion  Scapular Bone Region Mild depletion  Dorsal Hand Mild depletion  Patellar Region Moderate depletion  Anterior  Thigh Region Moderate depletion  Posterior Calf Region Moderate depletion  Edema (RD Assessment) None  Hair Reviewed  Eyes Reviewed  Mouth Reviewed  Skin Reviewed  Nails Unable to assess  [nail polish]       Diet Order:   Diet Order             DIET SOFT Room service appropriate? Yes; Fluid consistency: Thin  Diet effective now                   EDUCATION NEEDS:   Education needs have been addressed  Skin:  Skin Assessment: Reviewed RN Assessment  Last BM:  12/18 (type 4, medium amount)  Height:   Ht Readings from Last 1 Encounters:  02/21/21 5\' 5"  (1.651 m)    Weight:   Wt Readings from Last 1 Encounters:  02/23/21 52.1 kg    Estimated Nutritional Needs:  Kcal:  1650-1850 Protein:  85-100g Fluid:  1.9L/day      Jarome Matin, MS, RD, LDN, CNSC Inpatient Clinical Dietitian RD pager # available in AMION  After hours/weekend pager # available in [facility]

## 2021-02-25 NOTE — Consult Note (Addendum)
Referring Provider: Dr. Wyline Copas Primary Care Physician:  Inda Coke, [place] Primary Gastroenterologist:  Althia Forts  Reason for Consultation:  Abdominal Pain; Pouchitis  HPI: Lisa Donovan is a 20 y.o. 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 She was treated for pouchitis with Abx and had stenosis of her ileoanal anastomosis in November (at [facility]) and again last week (Dr. Brantley Stage). Bloody diarrhea has resolved on IV Abx. Has had recurrent abdominal pain for years. Reports abdominal pain and nausea as soon as she starts to eat. Denies vomiting. Parents in room.  Past Medical History:  Diagnosis Date   Anemia    Colon polyps    Dx at age 42, has colonoscopy yearly   [name]    [name]    Dx at age 43   GERD (gastroesophageal reflux disease)    H/O colonoscopy    Pt has has them yearly   History of endoscopy    [facility])     Past Surgical History:  Procedure Laterality Date   APPENDECTOMY     COLOPROCTECTOMY W/ ILEO J POUCH     EVALUATION UNDER ANESTHESIA WITH ANAL FISTULECTOMY N/A 02/21/2021   Procedure: EXAM UNDER ANESTHESIA WITH ANAL FISTULECTOMY;  Surgeon: Erroll Luna, MD;  Location: [facility];  Service: General;  Laterality: N/A;   Oral surgery     Tumor removed     Numerous surgies since she was 74 months old    Prior to Admission medications   Medication Sig Start Date End Date Taking? Authorizing Provider  acetaminophen (TYLENOL) 500 MG tablet Take 500 mg by mouth every 6 (six) hours as needed for moderate pain.   Yes [provider]  ciprofloxacin (CIPRO) 500 MG tablet Take 500 mg by mouth daily. 01/10/21  Yes [provider]  hyoscyamine (LEVSIN SL) 0.125 MG SL tablet Take 0.125 mg by mouth every 6 (six) hours as needed for cramping. 05/16/20  Yes [provider]  loratadine  (CLARITIN) 10 MG tablet Take 10 mg by mouth daily as needed for allergies.   Yes [provider]  Menthol-Zinc Oxide 0.44-20.6 % OINT Apply 1 application topically daily as needed (burning). 10/14/20  Yes [provider]  mirtazapine (REMERON) 7.5 MG tablet Take 7.5 mg by mouth at bedtime. 06/08/20  Yes [provider]  omeprazole (PRILOSEC) 40 MG capsule Take 40 mg by mouth daily as needed (acid reflux). 08/24/19  Yes [provider]  ondansetron (ZOFRAN-ODT) 4 MG disintegrating tablet Take 1 tablet (4 mg total) by mouth every 8 (eight) hours as needed for nausea or vomiting. 12/12/20  Yes Inda Coke, PA  oxyCODONE (OXY IR/ROXICODONE) 5 MG immediate release tablet Take 5 mg by mouth every 6 (six) hours as needed for moderate pain or severe pain. 09/07/20  Yes [provider]  traMADol (ULTRAM) 50 MG tablet Take 50 mg by mouth 3 (three) times daily as needed for moderate pain. 01/23/21  Yes [provider]  benzonatate (TESSALON) 100 MG capsule Take 1 capsule (100 mg total) by mouth 3 (three) times daily as needed for cough. Patient not taking: Reported on 12/12/2020 01/31/20   Inda Coke, PA  chlorpheniramine-HYDROcodone [facility]) 10-8 MG/5ML SUER Take 5 mLs by mouth at bedtime as needed for cough. Patient not taking: Reported on 12/12/2020 01/31/20   Inda Coke, PA  desipramine [facility]) 25  MG tablet Take 25 mg by mouth 2 (two) times daily. Patient not taking: Reported on 02/16/2021 01/23/21   [provider]    Scheduled Meds:  Chlorhexidine Gluconate Cloth  6 each Topical Daily   feeding supplement  237 mL Oral TID WC   hydrocortisone   Rectal TID   insulin aspart  0-9 Units Subcutaneous Q6H   liver oil-zinc oxide   Topical TID   mirtazapine  7.5 mg Oral QHS   pantoprazole (PROTONIX) IV  40 mg Intravenous Q12H   sodium chloride flush  10-40 mL Intracatheter Q12H   Continuous Infusions:  sodium  chloride 125 mL/hr at 02/25/21 0957   sodium chloride     ciprofloxacin 400 mg (02/25/21 1003)   metronidazole 500 mg (02/25/21 1001)   potassium chloride 10 mEq (02/25/21 1429)   TPN ADULT (ION)     PRN Meds:.alum & mag hydroxide-simeth **AND** lidocaine, diphenhydrAMINE, HYDROmorphone (DILAUDID) injection, ondansetron (ZOFRAN) IV, oxyCODONE, sodium chloride flush, traMADol  Allergies as of 02/15/2021 - Review Complete 02/15/2021  Allergen Reaction Noted   Compazine [prochlorperazine]  09/08/2020   Morphine and related Nausea And Vomiting 09/08/2020   Zosyn [piperacillin-tazobactam in dex] Itching, Dermatitis, and Rash 09/08/2020   Neomycin Swelling and Rash 07/14/2012    History reviewed. No pertinent family history.  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  Physical Exam: Vital signs: Vitals:   02/25/21 0541 02/25/21 1305  BP: (!) 103/51 99/62  Pulse: (!) 52 83  Resp: 18 18  Temp: 97.7 F (36.5 C) 97.6 F (36.4 C)  SpO2: 99% 99%   Last BM Date:  02/24/21 General:  Lethargic, thin, no acute distress, pleasant   Head: normocephalic, atraumatic Eyes: anicteric sclera ENT: oropharynx clear Neck: supple, nontender Lungs:  Clear throughout to auscultation.   No wheezes, crackles, or rhonchi. No acute distress. Heart:  Regular rate and rhythm; no murmurs, clicks, rubs,  or gallops. Abdomen: diffuse tenderness (greatest in RLQ and LLQ) with guarding, mild distention, +BS, healed ostomy site in RLQ  Rectal:  Deferred Ext: no edema  GI:  Lab Results: Recent Labs    02/23/21 0412 02/24/21 0457 02/25/21 0401  WBC 8.0 7.9 8.2  HGB 11.5* 12.1 12.9  HCT 35.9* 38.6 40.8  PLT 214 231 233   BMET Recent Labs    02/23/21 0412 02/24/21 0457 02/25/21 0401  NA  139 138 137  K 3.2* 3.6 3.4*  CL 105 105 108  CO2 26 25 23   GLUCOSE 100* 98 111*  BUN 6 <5* 5*  CREATININE 0.60 0.60 0.49  CALCIUM 8.3* 8.8* 8.9   LFT Recent Labs    02/25/21 0401  PROT 6.2*  ALBUMIN 3.7  AST 12*  ALT 14  ALKPHOS 42  BILITOT 0.6   PT/INR No results for input(s): LABPROT, INR in the last 72 hours.   Studies/Results: No results found.  Impression/Plan: Chronic abdominal pain and failure to thrive - Tried to reassure patient and family. Long discussion with family and patient regarding her J-pouch and no objective signs of inflammation of the pouch and I do not think a pouchoscopy is necessary at this time. I think functional pain is main contributor to her abdominal pain and not due to her pouch. No acute surgical needs and surgery has signed off. TPN/TNA planned to help with her nutrition and she will need this nutrition for a period of time as an outpt. Continue IV Abx for another 72 hours and then change to PO Abx. Supportive care. Called Dr. Alisia Ferrari at [facility] and discussed her case with him and he feels that her pouch is fine and not the source of her recurrent pain. Patient needs to f/u with Dr. Alisia Ferrari as an outpt fairly soon to help map out a  long term plan for her recurrent pain. D/W Dr. Wyline Copas.   Time spent: 70 minutes on care, counseling, and documentation with over 75% spent on face-to-face interaction.   LOS: 9 days   Lear Ng  02/25/2021, 3:37 PM  Questions please call [phone]

## 2021-02-25 NOTE — Progress Notes (Signed)
Progress Note  4 Days Post-Op  Subjective: Pt reports some continued lower abdominal pain but different from previous pain with voiding. She was having explosive diarrhea but this has improved since starting abx for pouchitis. She reports pain is severe with trying to urinate and she is voiding large volumes at a time (800 cc). She reports persistent nausea. She is taking in some soft food but not much. She is drinking fluids. Mother at bedside this AM.   Objective: Vital signs in last 24 hours: Temp:  [97.7 F (36.5 C)-98.4 F (36.9 C)] 97.7 F (36.5 C) (12/19 0541) Pulse Rate:  [52-66] 52 (12/19 0541) Resp:  [16-18] 18 (12/19 0541) BP: (92-124)/(51-77) 103/51 (12/19 0541) SpO2:  [99 %] 99 % (12/19 0541) Last BM Date: 02/24/21  Intake/Output from previous day: 12/18 0701 - 12/19 0700 In: 4116.6 [P.O.:840; I.V.:2676.5; IV Piggyback:600.1] Out: 5503 [Urine:5500; Stool:3] Intake/Output this shift: Total I/O In: -  Out: 700 [Urine:700]  PE: General: pleasant, WD, thin female who is laying in bed in NAD Heart: regular, rate, and rhythm.   Lungs: Respiratory effort nonlabored Abd: soft, mild ttp in lower abdomen without peritonitis, mild distention, +BS MS: all 4 extremities are symmetrical with no cyanosis, clubbing, or edema. Skin: warm and dry with no masses, lesions, or rashes Psych: A&Ox3 with an appropriate affect.    Lab Results:  Recent Labs    02/24/21 0457 02/25/21 0401  WBC 7.9 8.2  HGB 12.1 12.9  HCT 38.6 40.8  PLT 231 233   BMET Recent Labs    02/24/21 0457 02/25/21 0401  NA 138 137  K 3.6 3.4*  CL 105 108  CO2 25 23  GLUCOSE 98 111*  BUN <5* 5*  CREATININE 0.60 0.49  CALCIUM 8.8* 8.9   PT/INR No results for input(s): LABPROT, INR in the last 72 hours. CMP     Component Value Date/Time   NA 137 02/25/2021 0401   K 3.4 (L) 02/25/2021 0401   CL 108 02/25/2021 0401   CO2 23 02/25/2021 0401   GLUCOSE 111 (H) 02/25/2021 0401   BUN 5 (L)  02/25/2021 0401   CREATININE 0.49 02/25/2021 0401   CREATININE 0.65 11/09/2019 1227   CALCIUM 8.9 02/25/2021 0401   PROT 6.2 (L) 02/25/2021 0401   ALBUMIN 3.7 02/25/2021 0401   AST 12 (L) 02/25/2021 0401   ALT 14 02/25/2021 0401   ALKPHOS 42 02/25/2021 0401   BILITOT 0.6 02/25/2021 0401   GFRNONAA >60 02/25/2021 0401   Lipase     Component Value Date/Time   LIPASE 17 02/15/2021 2050       Studies/Results: No results found.  Anti-infectives: Anti-infectives (From admission, onward)    Start     Dose/Rate Route Frequency Ordered Stop   02/22/21 1000  ciprofloxacin (CIPRO) IVPB 400 mg        400 mg 200 mL/hr over 60 Minutes Intravenous Every 12 hours 02/22/21 0943     02/22/21 1000  metroNIDAZOLE (FLAGYL) IVPB 500 mg        500 mg 100 mL/hr over 60 Minutes Intravenous Every 12 hours 02/22/21 0944     02/21/21 0600  cefoTEtan (CEFOTAN) 2 g in sodium chloride 0.9 % 100 mL IVPB       Note to Pharmacy: Pharmacy may adjust dose strength for optimal dosing.   Send with patient on call to the OR.  Anesthesia to complete antibiotic administration <9min prior to incision per [facility].   2 g 200  mL/hr over 30 Minutes Intravenous On call to O.R. 02/20/21 1635 02/21/21 1336        Assessment/Plan 20 year old woman with history of [name]/[name] who status post total colectomy/proctectomy with J-pouch and history of pouchitis and ileoanal anastomosis stenosis requiring dilation.  S/P EUA 12/15 Recurrent Pouchitis  - pouch emptying now with some improvement in pain and explosive diarrhea since starting abx - pt complaining of more nausea and urinary symptoms at this point - PICC placed and pt to start TPN today - recommend GI consult for management of pouchitis - no surgical needs at this point, general surgery will sign off but are available as needed for questions. Would recommend close follow up with surgeons and GI doc at [facility] as well  Bladder spasms and urinary  hesitancy - may be reactive to pouchitis, discussed with primary and recommend UA   FEN: soft diet as tolerated, TPN, IVF @125  cc/h VTE: SCDs ID: cipro/flagyl 12/16>>  LOS: 9 days    Norm Parcel, [facility] Surgery 02/25/2021, 10:38 AM Please see Amion for pager number during day hours 7:00am-4:30pm

## 2021-02-25 NOTE — Progress Notes (Addendum)
PROGRESS NOTE    Lisa Donovan  NOB:[id number] DOB: April 18, 2000 DOA: 02/15/2021 PCP: Inda Coke, PA    Brief Narrative:   20 year old female history of [name], [name] status post proctocolectomy with J-pouch June 2022 with diverting loop ileostomy reversal August 2022.  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  Patient noted to have undergoing anesthesia for excision of desmoid tumors on 02/14/2021 and noted following procedure and not had any bowel movement or flatus which was unusual as patient noted to have 5-6 bowel movements daily typically an hour after eating.  Patient presented worsening abdominal pain, nausea, vomiting and noted to be dehydrated.  CT scan done concerning for small bowel obstruction versus ileus.  Patient seen in the ED and attempts were made to transfer patient to [facility] (patient general surgeon and gastroenterologist) but due to bed availability patient subsequently admitted to [facility], general surgery consulted for further evaluation.  Patient placed on bowel rest, IV fluids, pain management, supportive care.  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 -Patient seen in consultation by general surgery. -Patient with continued complaints of abdominal pain and rectal pain with bowel movements that feel like burning sensation/reflux. -continues with marked pain requiring analgesia as needed, remains on po oxy and IV dilaudid prn -General Surgery had been  following, now signed off. Pt is s/p exam under anesthesia 12/15. Per General Surgery, recommendation for continuing cipro/flagyl for concerns of pouchitis -Appreciate input by dietitian. Now on TPN -given continued symptoms, have consulted GI. Appreciate input. Recommendation for continued TPA, continue abx x another 72hrs then change to PO abx. GI discussed with Dr. Alisia Ferrari at [facility] to discuss case. Will need close f/u with Dr. Alisia Ferrari  [name] -Outpatient follow-up.  Hypokalemia -replaced -Cont to followlytes     DVT prophylaxis: SCD's Code Status: Full Family Communication: Pt in room, family at bedside  Status is: Inpatient  Remains inpatient appropriate because: Severity of illness    Consultants:  General Surgery  Procedures:  Exam under anesthesia 02/21/21  Antimicrobials: Anti-infectives (From admission, onward)    Start     Dose/Rate Route Frequency Ordered Stop   02/22/21 1000  ciprofloxacin (CIPRO) IVPB 400 mg        400 mg 200 mL/hr over 60 Minutes Intravenous Every 12 hours 02/22/21 0943     02/22/21 1000  metroNIDAZOLE (FLAGYL) IVPB 500 mg        500 mg 100 mL/hr over 60 Minutes Intravenous Every 12 hours 02/22/21 0944     02/21/21 0600  cefoTEtan (CEFOTAN) 2 g in sodium chloride 0.9 % 100 mL IVPB       Note to Pharmacy: Pharmacy may adjust dose strength for optimal dosing.   Send with patient on call to the OR.  Anesthesia to complete antibiotic administration <23min prior to incision per [facility].   2 g 200 mL/hr over 30 Minutes Intravenous On call to O.R. 02/20/21 1635 02/21/21 1336       Subjective: Reports feeling bad today with continued abd pains  Objective: Vitals:   02/24/21 1329  02/24/21 2147 02/25/21 0541 02/25/21 1305  BP: (!) 92/53 124/77 (!) 103/51 99/62  Pulse: 61 66 (!) 52 83  Resp: 16 18 18 18   Temp: 98 F (36.7 C) 98.4 F (36.9 C) 97.7 F (36.5 C) 97.6 F (36.4 C)  TempSrc: Oral Oral Oral   SpO2: 99% 99% 99% 99%   Weight:      Height:        Intake/Output Summary (Last 24 hours) at 02/25/2021 1556 Last data filed at 02/25/2021 1347 Gross per 24 hour  Intake 4187.99 ml  Output 6203 ml  Net -2015.01 ml    Filed Weights   02/21/21 1156 02/22/21 0500 02/23/21 0536  Weight: 53.1 kg 50.8 kg 52.1 kg    Examination: General exam: Conversant, in no acute distress Respiratory system: normal chest rise, clear, no audible wheezing Cardiovascular system: regular rhythm, s1-s2 Gastrointestinal system: Nondistended, pos BS Central nervous system: No seizures, no tremors Extremities: No cyanosis, no joint deformities Skin: No rashes, no pallor Psychiatry: Affect normal // no auditory hallucinations   Data Reviewed: I have personally reviewed following labs and imaging studies  CBC: Recent Labs  Lab 02/20/21 0433 02/21/21 0431 02/22/21 0441 02/23/21 0412 02/24/21 0457 02/25/21 0401  WBC 7.0 7.1 8.1 8.0 7.9 8.2  NEUTROABS 3.9  --   --   --   --   --   HGB 12.1 12.6 12.7 11.5* 12.1 12.9  HCT 37.1 38.7 38.8 35.9* 38.6 40.8  MCV 80.8 80.8 82.4 84.3 84.8 84.0  PLT 242 241 246 214 231 793    Basic Metabolic Panel: Recent Labs  Lab 02/19/21 0439 02/20/21 0433 02/21/21 0431 02/22/21 0441 02/23/21 0412 02/24/21 0457 02/25/21 0401  NA 137   < > 140 137 139 138 137  K 3.7   < > 3.6 3.7 3.2* 3.6 3.4*  CL 105   < > 108 106 105 105 108  CO2 23   < > 26 24 26 25 23   GLUCOSE 91   < > 105* 142* 100* 98 111*  BUN 7   < > 6 8 6  <5* 5*  CREATININE 0.58   < > 0.61 0.55 0.60 0.60 0.49  CALCIUM 8.9   < > 8.9 8.8* 8.3* 8.8* 8.9  MG 2.1  --   --   --   --   --  2.0  PHOS  --   --   --   --   --   --  4.2   < > = values in this interval not displayed.    GFR: Estimated Creatinine Clearance: 91.5 mL/min (by C-G formula based on SCr of 0.49 mg/dL). Liver Function Tests: Recent Labs  Lab 02/21/21 0431 02/22/21 0441 02/23/21 0412 02/24/21 0457 02/25/21 0401  AST 13* 12* 9* 10* 12*  ALT 24 19  14 13 14   ALKPHOS 47 41 38 40 42  BILITOT 0.6 0.6 0.5 0.5 0.6  PROT 6.6 6.2* 5.5* 5.9* 6.2*  ALBUMIN 3.9 3.6 3.3* 3.4* 3.7    No results for input(s): LIPASE, AMYLASE in the last 168 hours.  No results for input(s): AMMONIA in the last 168 hours. Coagulation Profile: No results for input(s): INR, PROTIME in the last 168 hours. Cardiac Enzymes: No results for input(s): CKTOTAL, CKMB, CKMBINDEX, TROPONINI in the last 168 hours. BNP (last 3 results) No results for input(s): PROBNP in the last 8760 hours. HbA1C: No results for input(s): HGBA1C in the last 72 hours. CBG: No results for input(s):  GLUCAP in the last 168 hours. Lipid Profile: Recent Labs    02/25/21 0401  TRIG 64   Thyroid Function Tests: No results for input(s): TSH, T4TOTAL, FREET4, T3FREE, THYROIDAB in the last 72 hours. Anemia Panel: No results for input(s): VITAMINB12, FOLATE, FERRITIN, TIBC, IRON, RETICCTPCT in the last 72 hours. Sepsis Labs: No results for input(s): PROCALCITON, LATICACIDVEN in the last 168 hours.  Recent Results (from the past 240 hour(s))  Resp Panel by RT-PCR (Flu A&B, Covid) Nasopharyngeal Swab     Status: None   Collection Time: 02/16/21 12:15 AM   Specimen: Nasopharyngeal Swab; Nasopharyngeal(NP) swabs in vial transport medium  Result Value Ref Range Status   SARS Coronavirus 2 by RT PCR NEGATIVE NEGATIVE Final    Comment: (NOTE) SARS-CoV-2 target nucleic acids are NOT DETECTED.  The SARS-CoV-2 RNA is generally detectable in upper respiratory specimens during the acute phase of infection. The lowest concentration of SARS-CoV-2 viral copies this assay can detect is 138 copies/mL. A negative result does not preclude SARS-Cov-2 infection and should not be used as the sole basis for treatment or other patient management decisions. A negative result may occur with  improper specimen collection/handling, submission of specimen other than nasopharyngeal swab, presence of viral  mutation(s) within the areas targeted by this assay, and inadequate number of viral copies(<138 copies/mL). A negative result must be combined with clinical observations, patient history, and epidemiological information. The expected result is Negative.  Fact Sheet for Patients:  EntrepreneurPulse.com.au  Fact Sheet for Healthcare Providers:  IncredibleEmployment.be  This test is no t yet approved or cleared by the [place] FDA and  has been authorized for detection and/or diagnosis of SARS-CoV-2 by FDA under an Emergency Use Authorization (EUA). This EUA will remain  in effect (meaning this test can be used) for the duration of the COVID-19 declaration under Section 564(b)(1) of the Act, 21 U.S.C.section 360bbb-3(b)(1), unless the authorization is terminated  or revoked sooner.       Influenza A by PCR NEGATIVE NEGATIVE Final   Influenza B by PCR NEGATIVE NEGATIVE Final    Comment: (NOTE) The Xpert Xpress SARS-CoV-2/FLU/RSV plus assay is intended as an aid in the diagnosis of influenza from Nasopharyngeal swab specimens and should not be used as a sole basis for treatment. Nasal washings and aspirates are unacceptable for Xpert Xpress SARS-CoV-2/FLU/RSV testing.  Fact Sheet for Patients: EntrepreneurPulse.com.au  Fact Sheet for Healthcare Providers: IncredibleEmployment.be  This test is not yet approved or cleared by the [place] FDA and has been authorized for detection and/or diagnosis of SARS-CoV-2 by FDA under an Emergency Use Authorization (EUA). This EUA will remain in effect (meaning this test can be used) for the duration of the COVID-19 declaration under Section 564(b)(1) of the Act, 21 U.S.C. section 360bbb-3(b)(1), unless the authorization is terminated or revoked.  Performed at [facility], [address] [place], [place] [address]       Radiology  Studies: No results found.  Scheduled Meds:  Chlorhexidine Gluconate Cloth  6 each Topical Daily   feeding supplement  237 mL Oral TID WC   hydrocortisone   Rectal TID   insulin aspart  0-9 Units Subcutaneous Q6H   liver oil-zinc oxide   Topical TID   mirtazapine  7.5 mg Oral QHS   pantoprazole (PROTONIX) IV  40 mg Intravenous Q12H   sodium chloride flush  10-40 mL Intracatheter Q12H   Continuous Infusions:  sodium chloride 125 mL/hr at 02/25/21 0957   sodium  chloride     ciprofloxacin 400 mg (02/25/21 1003)   metronidazole 500 mg (02/25/21 1001)   potassium chloride 10 mEq (02/25/21 1542)   TPN ADULT (ION)       LOS: 9 days   Marylu Lund, MD [facility] Pager On Amion  If 7PM-7AM, please contact night-coverage 02/25/2021, 3:56 PM

## 2021-02-25 NOTE — Progress Notes (Signed)
PHARMACY - TOTAL PARENTERAL NUTRITION CONSULT NOTE   Indication:  Intolerance to Enteral Feeding  Patient Measurements: Height: 5\' 5"  (165.1 cm) Weight: 52.1 kg (114 lb 13.8 oz) IBW/kg (Calculated) : 57 TPN AdjBW (KG): 53.1 Body mass index is 19.11 kg/m.  Assessment: 89 yoF with partial small bowel obstruction on 12/9.  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  She is s/p CCS exam on 12/15 with no significant findings or surgical indications.   She currently c/o pain with BMs and bloating.  Limited PO intake.  RD recommend initiation of TPN given suspected continued severe malnutrition, and poor PO since admission (12/9 -9 days ago).   Glucose / Insulin: AM glucose 111 Electrolytes: K low at 3.4 (KCl PO ordered 12/18, not given), other WNL including Na, Mag, Phos. Renal: SCr WNL, BUN low Hepatic: LFTs and Tbili WNL.  Albumin and Prealbumin WNL.  TG 64 Intake / Output; MIVF:  Intake: NS at 125 ml/hr Output: Urine 5500 mL, stool x1 GI Imaging:  12/16 CT: no evidence of intestinal obstruction or pneumoperitoneum.No hydronephrosis.  Previous subtotal colectomy. Oral contrast has reached the rectum.  Central access: PICC (12/16) TPN start date: 12/19  Nutritional Goals: Goal TPN rate is 80 mL/hr (provides 96 g of protein and 1843 kcals per day)  RD Assessment: Estimated Needs Total Energy Estimated Needs: 1650-1850 Total Protein Estimated Needs: 85-100g Total Fluid Estimated Needs: 1.9L/day  Current Nutrition:  Soft diet Ensure TID  Plan:  Start TPN at 1mL/hr at 1800 Standard Electrolytes in TPN: Na 84mEq/L, K 10mEq/L, Ca 8mEq/L, Mg 75mEq/L, and Phos 14mmol/L. Cl:Ac 1:1 Standard MVI and trace elements in TPN Initiate Sensitive q6h SSI and adjust as needed   Reduce MIVF to 85 mL/hr at 1800   Monitor TPN labs on Mon/Thurs   Gretta Arab PharmD, BCPS Clinical Pharmacist [facility] main pharmacy [phone] 02/25/2021 10:42 AM

## 2021-02-26 LAB — GLUCOSE, CAPILLARY
Glucose-Capillary: 101 mg/dL — ABNORMAL HIGH (ref 70–99)
Glucose-Capillary: 112 mg/dL — ABNORMAL HIGH (ref 70–99)
Glucose-Capillary: 124 mg/dL — ABNORMAL HIGH (ref 70–99)
Glucose-Capillary: 125 mg/dL — ABNORMAL HIGH (ref 70–99)
Glucose-Capillary: 129 mg/dL — ABNORMAL HIGH (ref 70–99)

## 2021-02-26 LAB — MAGNESIUM: Magnesium: 2.3 mg/dL (ref 1.7–2.4)

## 2021-02-26 LAB — BASIC METABOLIC PANEL
Anion gap: 5 (ref 5–15)
BUN: 10 mg/dL (ref 6–20)
CO2: 26 mmol/L (ref 22–32)
Calcium: 8.7 mg/dL — ABNORMAL LOW (ref 8.9–10.3)
Chloride: 106 mmol/L (ref 98–111)
Creatinine, Ser: 0.59 mg/dL (ref 0.44–1.00)
GFR, Estimated: >60 mL/min (ref >60)
Glucose, Bld: 133 mg/dL — ABNORMAL HIGH (ref 70–99)
Potassium: 3.5 mmol/L (ref 3.5–5.1)
Sodium: 137 mmol/L (ref 135–145)

## 2021-02-26 LAB — PHOSPHORUS: Phosphorus: 3.8 mg/dL (ref 2.5–4.6)

## 2021-02-26 MED ORDER — TRAVASOL 10 % IV SOLN
INTRAVENOUS | Status: AC
  Filled 2021-02-26: qty 720

## 2021-02-26 MED ORDER — SACCHAROMYCES BOULARDII 250 MG PO CAPS
250.0000 mg | ORAL_CAPSULE | Freq: Two times a day (BID) | ORAL | Status: DC
  Administered 2021-02-26 – 2021-03-02 (×8): 250 mg via ORAL
  Filled 2021-02-26 (×9): qty 1

## 2021-02-26 NOTE — Progress Notes (Signed)
[facility] Progress Note  Lisa Donovan 20 y.o. 21/04/2000   Subjective: Did not sleep last night due to recurrent abdominal pain.  Objective: Vital signs: Vitals:   02/25/21 2151 02/26/21 0643  BP: 112/69 (!) 123/54  Pulse: 81 66  Resp: 16 18  Temp: 98.8 F (37.1 C) 97.8 F (36.6 C)  SpO2: 100% 100%    Physical Exam: Gen: lethargic, thin, no acute distress  HEENT: anicteric sclera CV: RRR Chest: CTA B Abd: diffuse tenderness with guarding (greatest in LLQ), soft, nondistended,+BS Ext: no edema  Lab Results: Recent Labs    02/24/21 0457 02/25/21 0401  NA 138 137  K 3.6 3.4*  CL 105 108  CO2 25 23  GLUCOSE 98 111*  BUN <5* 5*  CREATININE 0.60 0.49  CALCIUM 8.8* 8.9  MG  --  2.0  PHOS  --  4.2   Recent Labs    02/24/21 0457 02/25/21 0401  AST 10* 12*  ALT 13 14  ALKPHOS 40 42  BILITOT 0.5 0.6  PROT 5.9* 6.2*  ALBUMIN 3.4* 3.7   Recent Labs    02/24/21 0457 02/25/21 0401  WBC 7.9 8.2  HGB 12.1 12.9  HCT 38.6 40.8  MCV 84.8 84.0  PLT 231 233      Assessment/Plan: 20 yo with history of [name] and Gardner's syndrome s/p proctocolectomy with J-pouch this past summer. Recent partial SBO that clinically has resolved. I do not think her abdominal pain is due to an obstruction or pouchitis. Continue IV Abx until Thursday and then change to oral Abx (Cipro/Flagyl) to continue for another 7 days on oral Abx. On TPN and will need that as an outpt. Needs close f/u with Dr. Alisia Ferrari at [facility]. D/W Dr. Wyline Copas. No additional GI recs. Will sign off. Call us back if questions.   Lear Ng 02/26/2021, 12:34 PM  Questions please call [phone] Patient ID: Lisa Donovan, female   DOB: Apr 03, 2000, 20 y.o.   MRN: [id number]

## 2021-02-26 NOTE — Progress Notes (Signed)
PHARMACY - TOTAL PARENTERAL NUTRITION CONSULT NOTE   Indication:  Intolerance to Enteral Feeding  Patient Measurements: Height: 5\' 5"  (165.1 cm) Weight: 51 kg (112 lb 6.4 oz) IBW/kg (Calculated) : 57 TPN AdjBW (KG): 53.1 Body mass index is 18.7 kg/m.  Assessment: 51 yoF with partial small bowel obstruction on 12/9.  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  She is s/p CCS exam on 12/15 with no significant findings or surgical indications.   She currently c/o pain with BMs and bloating.  Limited PO intake.  RD recommend initiation of TPN given suspected continued severe malnutrition, and poor PO since admission (12/9 -9 days ago).   Glucose / Insulin: CBGs 103- 129.  1 unit SSI / 24 hrs.  Electrolytes: K mildly low, others WNL including Na, Mag, Phos on 12/19.  No new labs drawn 12/20 despite orders.  Follow up results when available.   Renal: SCr WNL, BUN low Hepatic: LFTs and Tbili WNL.  Albumin and Prealbumin WNL.  TG 64 Intake / Output; MIVF: I/O net -2500 mL Intake: NS at 85 ml/hr Output: Urine 5250 mL, no stool charted GI Imaging:  12/16 CT: no evidence of intestinal obstruction or pneumoperitoneum.No hydronephrosis.  Previous subtotal colectomy. Oral contrast has reached the rectum.  Central access: PICC (12/16) TPN start date: 12/19  Nutritional Goals: Goal TPN rate is 80 mL/hr (provides 96 g of protein and 1843 kcals per day)  RD Assessment: Estimated Needs Total Energy Estimated Needs: 1650-1850 Total Protein Estimated Needs: 85-100g Total Fluid Estimated Needs: 1.9L/day  Current Nutrition:  Soft diet Ensure TID  Plan:  Advance TPN to 23mL/hr at 1800 Standard Electrolytes in TPN: Na 62mEq/L, K 47mEq/L, Ca 66mEq/L, Mg 1mEq/L, and Phos 42mmol/L. Cl:Ac 1:1 Standard MVI and trace elements  in TPN Initiate Sensitive q6h SSI and adjust as needed   Reduce MIVF to 45 mL/hr at 1800  Monitor TPN labs on Mon/Thurs   Gretta Arab PharmD, BCPS Clinical Pharmacist [facility] main pharmacy [phone] 02/26/2021 9:54 AM

## 2021-02-26 NOTE — Progress Notes (Signed)
PROGRESS NOTE    Lisa Donovan  HQI:[id number] DOB: [birth year] DOA: 02/15/2021 PCP: Inda Coke, PA    Brief Narrative:   20 year old female history of [name], [name] status post proctocolectomy with J-pouch June 2022 with diverting loop ileostomy reversal August 2022.  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  Patient noted to have undergoing anesthesia for excision of desmoid tumors on 02/14/2021 and noted following procedure and not had any bowel movement or flatus which was unusual as patient noted to have 5-6 bowel movements daily typically an hour after eating.  Patient presented worsening abdominal pain, nausea, vomiting and noted to be dehydrated.  CT scan done concerning for small bowel obstruction versus ileus.  Patient seen in the ED and attempts were made to transfer patient to [place] (patient general surgeon and gastroenterologist) but due to bed availability patient subsequently admitted to [facility], general surgery consulted for further evaluation.  Patient placed on bowel rest, IV fluids, pain management, supportive care.  Assessment & Plan:   Principal Problem:   SBO (small bowel obstruction) ([place])   partial small bowel obstruction -Patient with history of [name] status post proctocolectomy with J-pouch June 2022 with diverting loop ileostomy reversal August 2022 presenting with nausea vomiting worsening abdominal pain with imaging findings concerning for distal partial small bowel obstruction. -Patient continues with continued complaints of abdominal pain and rectal pain with bowel movements that feel like burning sensation/reflux, seems to made worse with certain oral meds such as potassium -remains on po oxy and IV dilaudid prn -General Surgery had been following, now signed off. Pt is s/p exam  under anesthesia 12/15. Per General Surgery, recommendation for continuing cipro/flagyl empirically to cover pouchitis -Appreciate input by dietitian. Now on TPN -given continued symptoms, have consulted GI. Appreciate input. Recommendation for continued TPA, continue abx x another 72hrs then change to PO abx. GI discussed with Dr. Alisia Ferrari at [facility] to discuss case. Will need close f/u with Dr. Alisia Ferrari after discharge  [name] -Outpatient follow-up.  Hypokalemia -continue to follow bmet trends -Continued on TPA  DVT prophylaxis: SCD's Code Status: Full Family Communication: Pt in room, family at bedside  Status is: Inpatient  Remains inpatient appropriate because: Severity of illness    Consultants:  General Surgery  Procedures:  Exam under anesthesia 02/21/21  Antimicrobials: Anti-infectives (From admission, onward)    Start     Dose/Rate Route Frequency Ordered Stop   02/22/21 1000  ciprofloxacin (CIPRO) IVPB 400 mg        400 mg 200 mL/hr over 60 Minutes Intravenous Every 12 hours 02/22/21 0943     02/22/21 1000  metroNIDAZOLE (FLAGYL) IVPB 500 mg        500 mg 100 mL/hr over 60 Minutes Intravenous Every 12 hours 02/22/21 0944     02/21/21 0600  cefoTEtan (CEFOTAN) 2 g in sodium chloride 0.9 % 100 mL IVPB       Note to Pharmacy: Pharmacy may adjust dose strength for optimal dosing.   Send with patient on call to the OR.  Anesthesia to complete antibiotic administration <65min prior to incision per [facility].   2 g 200 mL/hr over 30 Minutes Intravenous On call to O.R. 02/20/21 1635 02/21/21 1336       Subjective: Reports feeling bad today with continued abd pains  Objective: Vitals:   02/25/21 2151  02/26/21 0500 02/26/21 0643 02/26/21 1338  BP: 112/69  (!) 123/54 (!) 107/51  Pulse: 81  66 64  Resp: 16  18 18   Temp: 98.8 F (37.1 C)  97.8 F (36.6 C) 97.6 F (36.4 C)  TempSrc: Oral  Oral Oral  SpO2: 100%  100% 98%  Weight:  51 kg    Height:         Intake/Output Summary (Last 24 hours) at 02/26/2021 1434 Last data filed at 02/26/2021 1400 Gross per 24 hour  Intake 2074.88 ml  Output 4450 ml  Net -2375.12 ml    Filed Weights   02/22/21 0500 02/23/21 0536 02/26/21 0500  Weight: 50.8 kg 52.1 kg 51 kg    Examination: General exam: Awake, laying in bed, in nad Respiratory system: Normal respiratory effort, no wheezing Cardiovascular system: regular rate, s1, s2 Gastrointestinal system: Soft, nondistended, positive BS Central nervous system: CN2-12 grossly intact, strength intact Extremities: Perfused, no clubbing Skin: Normal skin turgor, no notable skin lesions seen Psychiatry: Mood normal // no visual hallucinations   Data Reviewed: I have personally reviewed following labs and imaging studies  CBC: Recent Labs  Lab 02/20/21 0433 02/21/21 0431 02/22/21 0441 02/23/21 0412 02/24/21 0457 02/25/21 0401  WBC 7.0 7.1 8.1 8.0 7.9 8.2  NEUTROABS 3.9  --   --   --   --   --   HGB 12.1 12.6 12.7 11.5* 12.1 12.9  HCT 37.1 38.7 38.8 35.9* 38.6 40.8  MCV 80.8 80.8 82.4 84.3 84.8 84.0  PLT 242 241 246 214 231 720    Basic Metabolic Panel: Recent Labs  Lab 02/22/21 0441 02/23/21 0412 02/24/21 0457 02/25/21 0401 02/26/21 1202  NA 137 139 138 137 137  K 3.7 3.2* 3.6 3.4* 3.5  CL 106 105 105 108 106  CO2 24 26 25 23 26   GLUCOSE 142* 100* 98 111* 133*  BUN 8 6 <5* 5* 10  CREATININE 0.55 0.60 0.60 0.49 0.59  CALCIUM 8.8* 8.3* 8.8* 8.9 8.7*  MG  --   --   --  2.0 2.3  PHOS  --   --   --  4.2 3.8    GFR: Estimated Creatinine Clearance: 89.6 mL/min (by C-G formula based on SCr of 0.59 mg/dL). Liver Function Tests: Recent Labs  Lab 02/21/21 0431 02/22/21 0441 02/23/21 0412 02/24/21 0457 02/25/21 0401  AST 13* 12* 9* 10* 12*  ALT 24 19 14 13 14   ALKPHOS 47 41 38 40 42  BILITOT 0.6 0.6 0.5 0.5 0.6  PROT 6.6 6.2* 5.5* 5.9* 6.2*  ALBUMIN 3.9 3.6 3.3* 3.4* 3.7    No results for input(s): LIPASE, AMYLASE in  the last 168 hours.  No results for input(s): AMMONIA in the last 168 hours. Coagulation Profile: No results for input(s): INR, PROTIME in the last 168 hours. Cardiac Enzymes: No results for input(s): CKTOTAL, CKMB, CKMBINDEX, TROPONINI in the last 168 hours. BNP (last 3 results) No results for input(s): PROBNP in the last 8760 hours. HbA1C: No results for input(s): HGBA1C in the last 72 hours. CBG: Recent Labs  Lab 02/25/21 1900 02/26/21 0023 02/26/21 0640 02/26/21 1135  GLUCAP 103* 124* 129* 112*   Lipid Profile: Recent Labs    02/25/21 0401  TRIG 64    Thyroid Function Tests: No results for input(s): TSH, T4TOTAL, FREET4, T3FREE, THYROIDAB in the last 72 hours. Anemia Panel: No results for input(s): VITAMINB12, FOLATE, FERRITIN, TIBC, IRON, RETICCTPCT in the last 72 hours. Sepsis Labs: No  results for input(s): PROCALCITON, LATICACIDVEN in the last 168 hours.  No results found for this or any previous visit (from the past 240 hour(s)).     Radiology Studies: No results found.  Scheduled Meds:  Chlorhexidine Gluconate Cloth  6 each Topical Daily   feeding supplement  237 mL Oral TID WC   hydrocortisone   Rectal TID   insulin aspart  0-9 Units Subcutaneous Q6H   liver oil-zinc oxide   Topical TID   mirtazapine  7.5 mg Oral QHS   pantoprazole (PROTONIX) IV  40 mg Intravenous Q12H   saccharomyces boulardii  250 mg Oral BID   sodium chloride flush  10-40 mL Intracatheter Q12H   Continuous Infusions:  sodium chloride 85 mL/hr at 02/26/21 0636   ciprofloxacin 400 mg (02/26/21 1122)   metronidazole 500 mg (02/26/21 1135)   TPN ADULT (ION) 40 mL/hr at 02/26/21 0600   TPN ADULT (ION)       LOS: 10 days   Marylu Lund, MD [facility] Pager On Amion  If 7PM-7AM, please contact night-coverage 02/26/2021, 2:34 PM

## 2021-02-26 NOTE — Progress Notes (Signed)
Nutrition Follow-up  DOCUMENTATION CODES:   Non-severe (moderate) malnutrition in context of chronic illness  INTERVENTION:    Monitor magnesium, potassium, and phosphorus BID for at least 3 days, MD to replete as needed, as pt is at risk for refeeding syndrome given moderate malnutrition, inadequate PO intake for >2 weeks.   -TPN management per Pharmacy  -Continue Ensure Enlive TID -as tolerated  -Encouraged PO as tolerated  -D/c Calorie Count -not being completed   NUTRITION DIAGNOSIS:   Moderate Malnutrition related to chronic illness as evidenced by moderate fat depletion, moderate muscle depletion.  GOAL:   Patient will meet greater than or equal to 90% of their needs  MONITOR:   PO intake, Supplement acceptance, Labs, Weight trends, I & O's, Other (Comment) (TPN regimen)  ASSESSMENT:   20 year old female history of [name], [name] status post proctocolectomy with J-pouch June 2022 with diverting loop ileostomy reversal August 2022.  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  Patient noted to have undergoing anesthesia for excision of desmoid tumors on 02/14/2021 and noted following procedure and not had any bowel movement or flatus which was unusual as patient noted to have 5-6 bowel movements daily typically an hour after eating.  Patient presented worsening abdominal pain, nausea, vomiting and noted to be dehydrated.  Calorie count not being completed. RD has reached out to RN on 12/18 and notified them of Calorie count order.  Per patient and mother in room, pt ate a few bites of a bagel with cream cheese. Also, her father had brought in a egg and cheese biscuit from [place] as well as grits which she ate small amounts of. States she is trying to snack on easy to digest foods such as breads. Not really consuming much protein as  she doesn't feel she tolerates meats at this time. Pt having abdominal pain after most intakes. States she was having BMs yesterday. Reports bloating in her abdominal area as well.  Pt's mother states pt was receiving vitamin doses in her TPN in August but was unable to find what exact vitamins were being supplemented, will get back to RD.  Broached the subject of tube feeding with pt and pt's mother. There is a concern for pt's ability to tolerate the volume of feeds at this time as well as food "running through her". Also, pt's mother brought up possible malabsorption from having such frequent diarrhea after eating and questions if pt is absorbing what she is taking in PO. Encouraged pt to consume whatever she thinks she can tolerate to maintain GI motility and function. Will continue TPN and pt and family agreed this what they would rather pursue and not interested in feeding tube at this time. Pt hopeful she will be able to eat well again after improvement in nutrition status. Per GI, plan is for TPN at home.  TPN advancing to 60 ml/hr (providing  1382 kcals and 72g protein).  Pt's mother also expressed concern that the pt's Mg and Phos has not been checked today. Noted that Pharmacy placed order for Mg and Phos to be checked today.   Admission weight: 117 lbs. Current weight: 112 lbs.  Medications: Remeron, IV Zofran  Labs reviewed: CBGs: 103-129 Low K  Diet Order:   Diet Order             DIET SOFT Room service appropriate? Yes; Fluid consistency:  Thin  Diet effective now                   EDUCATION NEEDS:   Education needs have been addressed  Skin:  Skin Assessment: Reviewed RN Assessment  Last BM:  Per patient, pt was having BMs all day yesterday 12/19  Height:   Ht Readings from Last 1 Encounters:  02/21/21 5\' 5"  (1.651 m)    Weight:   Wt Readings from Last 1 Encounters:  02/26/21 51 kg    BMI:  Body mass index is 18.7 kg/m.  Estimated Nutritional  Needs:   Kcal:  1650-1850  Protein:  85-100g  Fluid:  1.9L/day  Clayton Bibles, MS, RD, LDN Inpatient Clinical Dietitian Contact information available via Amion

## 2021-02-26 NOTE — TOC Initial Note (Signed)
Transition of Care [facility]) - Initial/Assessment Note    Patient Details  Name: Lisa Donovan MRN: [id number] Date of Birth: 02/25/2001  [facility]) CM/SW Contact:    Lennart Pall, LCSW Phone Number: 02/26/2021, 1:24 PM  Clinical Narrative:                 Met with pt and mother to introduce self/ TOC role with dc planning needs.  Both reporting plan for pt to dc home with parents when cleared.  Mother requests that, if TPN still needed at home, that we set this up with [facility] and provided contact of Alicia Amel @ [phone]. Per MD, he anticipates 3 more days of IV abx and then may be ready medically ready for dc and will need home TPN.  Have reached out to [facility] and await return information to confirm if they can service patient again.  Expected Discharge Plan: [place] Barriers to Discharge: Continued Medical Work up   Patient Goals and CMS Choice Patient states their goals for this hospitalization and ongoing recovery are:: return home      Expected Discharge Plan and Services Expected Discharge Plan: [place]       Living arrangements for the past 2 months: Single Family Home                                      Prior Living Arrangements/Services Living arrangements for the past 2 months: Single Family Home Lives with:: Parents Patient language and need for interpreter reviewed:: Yes Do you feel safe going back to the place where you live?: Yes      Need for Family Participation in Patient Care: Yes (Comment) Care giver support system in place?: Yes (comment)   Criminal Activity/Legal Involvement Pertinent to Current Situation/Hospitalization: No - Comment as needed  Activities of Daily Living Home Assistive Devices/Equipment: None ADL Screening (condition at time of admission) Patient's cognitive ability adequate to safely complete daily activities?: Yes Is the patient deaf or have difficulty  hearing?: No Does the patient have difficulty seeing, even when wearing glasses/contacts?: No Does the patient have difficulty concentrating, remembering, or making decisions?: No Patient able to express need for assistance with ADLs?: Yes Does the patient have difficulty dressing or bathing?: No Independently performs ADLs?: Yes (appropriate for developmental age) Does the patient have difficulty walking or climbing stairs?: No Weakness of Legs: None Weakness of Arms/Hands: None  Permission Sought/Granted Permission sought to share information with : Family Supports Permission granted to share information with : Yes, Verbal Permission Granted  Share Information with NAME: Jacquelin Krajewski     Permission granted to share info w Relationship: mother  Permission granted to share info w Contact Information: [phone]  Emotional Assessment Appearance:: Appears stated age Attitude/Demeanor/Rapport: Gracious Affect (typically observed): Accepting Orientation: : Oriented to Self, Oriented to Place, Oriented to  Time, Oriented to Situation Alcohol / Substance Use: Not Applicable Psych Involvement: No (comment)  Admission diagnosis:  SBO (small bowel obstruction) ([facility]) [K56.609] Generalized abdominal pain [R10.84] Patient Active Problem List   Diagnosis Date Noted   SBO (small bowel obstruction) ([place]) 02/16/2021   Pain 08/04/2019   Chemotherapy-induced nausea 05/14/2019   Syncope 05/14/2019   Thyroid cyst 10/05/2018   [name] of skin 08/25/2012   Intestinal polyps 08/25/2012   Gardner's syndrome 07/14/2012   Other benign neoplasm of  connective and other soft tissue of upper limb, including shoulder 02/26/2011   Benign neoplasm of skin of trunk 01/17/2004   PCP:  Inda Coke, PA Pharmacy:   [id number] [address]Phone: [phone]:  [phone]    Social Determinants of Health (SDOH) Interventions    Readmission Risk Interventions No flowsheet data found.

## 2021-02-27 DIAGNOSIS — E440 Moderate protein-calorie malnutrition: Secondary | ICD-10-CM

## 2021-02-27 LAB — MAGNESIUM: Magnesium: 2.3 mg/dL (ref 1.7–2.4)

## 2021-02-27 LAB — BASIC METABOLIC PANEL
Anion gap: 5 (ref 5–15)
BUN: 12 mg/dL (ref 6–20)
CO2: 26 mmol/L (ref 22–32)
Calcium: 8.6 mg/dL — ABNORMAL LOW (ref 8.9–10.3)
Chloride: 106 mmol/L (ref 98–111)
Creatinine, Ser: 0.5 mg/dL (ref 0.44–1.00)
GFR, Estimated: >60 mL/min (ref >60)
Glucose, Bld: 125 mg/dL — ABNORMAL HIGH (ref 70–99)
Potassium: 3.7 mmol/L (ref 3.5–5.1)
Sodium: 137 mmol/L (ref 135–145)

## 2021-02-27 LAB — GLUCOSE, CAPILLARY
Glucose-Capillary: 135 mg/dL — ABNORMAL HIGH (ref 70–99)
Glucose-Capillary: 141 mg/dL — ABNORMAL HIGH (ref 70–99)
Glucose-Capillary: 148 mg/dL — ABNORMAL HIGH (ref 70–99)
Glucose-Capillary: 152 mg/dL — ABNORMAL HIGH (ref 70–99)

## 2021-02-27 LAB — PHOSPHORUS: Phosphorus: 3.7 mg/dL (ref 2.5–4.6)

## 2021-02-27 MED ORDER — TRAVASOL 10 % IV SOLN
INTRAVENOUS | Status: AC
  Filled 2021-02-27: qty 960

## 2021-02-27 MED ORDER — SODIUM CHLORIDE 0.9 % IV SOLN: INTRAVENOUS | Status: AC

## 2021-02-27 MED ORDER — PANTOPRAZOLE SODIUM 40 MG PO TBEC
40.0000 mg | DELAYED_RELEASE_TABLET | Freq: Two times a day (BID) | ORAL | Status: DC
  Administered 2021-02-27 – 2021-03-02 (×7): 40 mg via ORAL
  Filled 2021-02-27 (×7): qty 1

## 2021-02-27 MED ORDER — SODIUM CHLORIDE 0.9 % IV SOLN: INTRAVENOUS | Status: DC

## 2021-02-27 MED ORDER — ADULT MULTIVITAMIN W/MINERALS CH
1.0000 | ORAL_TABLET | Freq: Every day | ORAL | Status: DC
Start: 2021-02-27 — End: 2021-03-02
  Administered 2021-03-01: 12:00:00 1 via ORAL
  Filled 2021-02-27 (×3): qty 1

## 2021-02-27 NOTE — Progress Notes (Addendum)
PHARMACY - TOTAL PARENTERAL NUTRITION CONSULT NOTE   Indication:  Intolerance to Enteral Feeding  Patient Measurements: Height: 5\' 5"  (165.1 cm) Weight: 51 kg (112 lb 6.4 oz) IBW/kg (Calculated) : 57 TPN AdjBW (KG): 53.1 Body mass index is 18.7 kg/m.  Assessment: 69 yoF with partial small bowel obstruction on 12/9.  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  She is s/p CCS exam on 12/15 with no significant findings or surgical indications.   She currently c/o pain with BMs and bloating.  Limited PO intake.  RD recommend initiation of TPN given suspected continued severe malnutrition, and poor PO since admission (12/9 -9 days ago).   Glucose / Insulin: No hx DM.  CBGs 101-141.  2 units SSI / 24 hrs.  Electrolytes: Lytes WNL, CorrCa near normal at 8.84.  Renal: SCr, BUN WNL Hepatic: LFTs and Tbili WNL.  Albumin and Prealbumin WNL.  TG 64 Intake / Output; MIVF: I/O net +1200 mL Intake: NS at 65 ml/hr Output: Urine 3750 mL, no stool charted GI Imaging:  12/16 CT: no evidence of intestinal obstruction or pneumoperitoneum.No hydronephrosis.  Previous subtotal colectomy. Oral contrast has reached the rectum.  Central access: PICC (12/16) TPN start date: 12/19  Nutritional Goals: Goal TPN rate is 80 mL/hr (provides 96 g of protein and 1843 kcals per day)  RD Assessment: Estimated Needs Total Energy Estimated Needs: 1650-1850 Total Protein Estimated Needs: 85-100g Total Fluid Estimated Needs: 1.9L/day  Current Nutrition:  Soft diet - intake not charted Ensure TID - pt refusing about 2/3 of administrations TPN to goal rate on 12/21  Plan:  Advance TPN to goal rate 80 mL/hr at 1800 Standard Electrolytes in TPN: Na 53mEq/L, K 70mEq/L, Ca 41mEq/L, Mg 10mEq/L, and Phos 21mmol/L. Cl:Ac 1:1 Change to PO  multivitamin. Initiate Sensitive q6h SSI and adjust as needed   Reduce MIVF to 45 mL/hr at 1800  Monitor TPN labs on Mon/Thurs - daily labs ordered per MD   Gretta Arab PharmD, BCPS Clinical Pharmacist [facility] main pharmacy [phone] 02/27/2021 8:29 AM

## 2021-02-27 NOTE — TOC Progression Note (Signed)
Transition of Care [facility]) - Progression Note    Patient Details  Name: Lisa Donovan MRN: [id number] Date of Birth: [birth year]  Transition of Care [facility]) CM/SW Contact  Lennart Pall, LCSW Phone Number: 02/27/2021, 2:53 PM  Clinical Narrative:    Have confirmed today with MD that pt may be ready for dc on Friday.  Pt and mother aware and hopeful. Able to confirm that [facility] will be able to manage her TPN for home again.  Have faxed requested [place] orders and TPN info to [facility] to [phone].  Will ask oncoming TOC coverage to be alert to any additional documentation needed by [facility].   Expected Discharge Plan: [place] Barriers to Discharge: Continued Medical Work up  Expected Discharge Plan and Services Expected Discharge Plan: [place] arrangements for the past 2 months: Single Family Home                                       Social Determinants of Health (SDOH) Interventions    Readmission Risk Interventions No flowsheet data found.

## 2021-02-27 NOTE — Progress Notes (Signed)
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  Patient noted to have undergoing anesthesia for excision of desmoid tumors on 02/14/2021 and noted following procedure and not had any bowel movement or flatus which was unusual as patient noted to have 5-6 bowel movements daily typically an hour after eating.  Patient presented worsening abdominal pain, nausea, vomiting and noted to be dehydrated.  CT scan done concerning for small bowel obstruction versus ileus.  Patient seen in the ED and attempts were made to transfer patient to [place] (patient general surgeon and gastroenterologist) but due to bed availability patient subsequently admitted to [facility], general surgery consulted for further evaluation.  Patient placed on bowel rest, IV fluids, pain management, supportive care.   Subjective: A/O x4, states even at home when feeling well only eats Kid-sized meals.   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 -Patient continues with continued complaints of abdominal pain and rectal pain with bowel movements that feel like burning sensation/reflux, seems to made worse with certain  oral meds such as potassium -remains on po oxy and IV dilaudid prn -General Surgery had been following, now signed off. Pt is s/p exam under anesthesia 12/15. Per General Surgery, recommendation for continuing cipro/flagyl empirically to cover pouchitis -Appreciate input by dietitian. Now on TPN -Consulted GI.  -Per GI;Continue IV Abx until Thursday and then change to oral Abx (Cipro/Flagyl) to continue for another 7 days on oral Abx. On TPN and will need that as an outpt. Needs close f/u with Dr. Alisia Ferrari at [place].  [name] -Outpatient follow-up.   Hypokalemia -continue to follow bmet trends -Continued on TPA -12/21 resolved  Moderate malnutrition - Continue TPN     DVT prophylaxis: SCD  Code Status: Full Family Communication: 12/21 mother at bedside for discussion of plan of care all questions answered Status is: Inpatient    Dispo: The patient is from: Home              Anticipated d/c is to: Home              Anticipated d/c date is: 2 days              Patient currently is not medically stable to d/c.      Consultants:  GI Dr. Michail Sermon   Procedures/Significant Events:     I have personally reviewed and interpreted all radiology studies and my findings are as above.  VENTILATOR SETTINGS:    Cultures   Antimicrobials: Anti-infectives (From admission, onward)    Start     Ordered Stop   02/22/21 1000  ciprofloxacin (CIPRO) IVPB 400 mg        02/22/21 0943     02/22/21 1000  metroNIDAZOLE (FLAGYL) IVPB 500 mg  02/22/21 0944     02/21/21 0600  cefoTEtan (CEFOTAN) 2 g in sodium chloride 0.9 % 100 mL IVPB       Note to Pharmacy: Pharmacy may adjust dose strength for optimal dosing.   Send with patient on call to the OR.  Anesthesia to complete antibiotic administration <10min prior to incision per [facility].   02/20/21 1635 02/21/21 1336         Devices    LINES / TUBES:      Continuous Infusions:  sodium chloride      sodium chloride 65 mL/hr at 02/27/21 0910   ciprofloxacin 400 mg (02/27/21 0914)   metronidazole 500 mg (02/27/21 0915)   TPN ADULT (ION) 60 mL/hr at 02/26/21 1736   TPN ADULT (ION)       Objective: Vitals:   02/26/21 0643 02/26/21 1338 02/26/21 2053 02/27/21 0551  BP: (!) 123/54 (!) 107/51 115/64 (!) 106/52  Pulse: 66 64 77 (!) 57  Resp: 18 18 18 20   Temp: 97.8 F (36.6 C) 97.6 F (36.4 C) 98.2 F (36.8 C) 97.6 F (36.4 C)  TempSrc: Oral Oral Oral Oral  SpO2: 100% 98% 100% 100%  Weight:      Height:        Intake/Output Summary (Last 24 hours) at 02/27/2021 1056 Last data filed at 02/27/2021 1000 Gross per 24 hour  Intake 5128.94 ml  Output 4350 ml  Net 778.94 ml   Filed Weights   02/22/21 0500 02/23/21 0536 02/26/21 0500  Weight: 50.8 kg 52.1 kg 51 kg    Examination:  General: A/O x4, No acute respiratory distress, cachectic Eyes: negative scleral hemorrhage, negative anisocoria, negative icterus ENT: Negative Runny nose, negative gingival bleeding, Neck:  Negative scars, masses, torticollis, lymphadenopathy, JVD Lungs: Clear to auscultation bilaterally without wheezes or crackles Cardiovascular: Regular rate and rhythm without murmur gallop or rub normal S1 and S2 Abdomen: Positive abdominal pain RLQ/LLQ, nondistended, positive soft, bowel sounds, no rebound, no ascites, no appreciable mass Extremities: No significant cyanosis, clubbing, or edema bilateral lower extremities Skin: Negative rashes, lesions, ulcers Psychiatric:  Negative depression, negative anxiety, negative fatigue, negative mania  Central nervous system:  Cranial nerves II through XII intact, tongue/uvula midline, all extremities muscle strength 5/5, sensation intact throughout,negative dysarthria, negative expressive aphasia, negative receptive aphasia.  .     Data Reviewed: Care during the described time interval was provided by me .  I have reviewed this patient's available data, including  medical history, events of note, physical examination, and all test results as part of my evaluation.   CBC: Recent Labs  Lab 02/21/21 0431 02/22/21 0441 02/23/21 0412 02/24/21 0457 02/25/21 0401  WBC 7.1 8.1 8.0 7.9 8.2  HGB 12.6 12.7 11.5* 12.1 12.9  HCT 38.7 38.8 35.9* 38.6 40.8  MCV 80.8 82.4 84.3 84.8 84.0  PLT 241 246 214 231 809   Basic Metabolic Panel: Recent Labs  Lab 02/23/21 0412 02/24/21 0457 02/25/21 0401 02/26/21 1202 02/27/21 0432  NA 139 138 137 137 137  K 3.2* 3.6 3.4* 3.5 3.7  CL 105 105 108 106 106  CO2 26 25 23 26 26   GLUCOSE 100* 98 111* 133* 125*  BUN 6 <5* 5* 10 12  CREATININE 0.60 0.60 0.49 0.59 0.50  CALCIUM 8.3* 8.8* 8.9 8.7* 8.6*  MG  --   --  2.0 2.3 2.3  PHOS  --   --  4.2 3.8 3.7   GFR: Estimated Creatinine Clearance: 89.6 mL/min (by  C-G formula based on SCr of 0.5 mg/dL). Liver Function Tests: Recent Labs  Lab 02/21/21 0431 02/22/21 0441 02/23/21 0412 02/24/21 0457 02/25/21 0401  AST 13* 12* 9* 10* 12*  ALT 24 19 14 13 14   ALKPHOS 47 41 38 40 42  BILITOT 0.6 0.6 0.5 0.5 0.6  PROT 6.6 6.2* 5.5* 5.9* 6.2*  ALBUMIN 3.9 3.6 3.3* 3.4* 3.7   No results for input(s): LIPASE, AMYLASE in the last 168 hours. No results for input(s): AMMONIA in the last 168 hours. Coagulation Profile: No results for input(s): INR, PROTIME in the last 168 hours. Cardiac Enzymes: No results for input(s): CKTOTAL, CKMB, CKMBINDEX, TROPONINI in the last 168 hours. BNP (last 3 results) No results for input(s): PROBNP in the last 8760 hours. HbA1C: No results for input(s): HGBA1C in the last 72 hours. CBG: Recent Labs  Lab 02/26/21 0640 02/26/21 1135 02/26/21 1732 02/26/21 2330 02/27/21 0547  GLUCAP 129* 112* 101* 125* 141*   Lipid Profile: Recent Labs    02/25/21 0401  TRIG 64   Thyroid Function Tests: No results for input(s): TSH, T4TOTAL, FREET4, T3FREE, THYROIDAB in the last 72 hours. Anemia Panel: No results for input(s): VITAMINB12,  FOLATE, FERRITIN, TIBC, IRON, RETICCTPCT in the last 72 hours. Urine analysis:    Component Value Date/Time   COLORURINE YELLOW 02/15/2021 2210   APPEARANCEUR CLEAR 02/15/2021 2210   LABSPEC 1.011 02/15/2021 2210   PHURINE 7.5 02/15/2021 2210   GLUCOSEU NEGATIVE 02/15/2021 2210   HGBUR NEGATIVE 02/15/2021 2210   BILIRUBINUR NEGATIVE 02/15/2021 2210   BILIRUBINUR Negative 11/09/2019 1323   KETONESUR NEGATIVE 02/15/2021 2210   PROTEINUR NEGATIVE 02/15/2021 2210   UROBILINOGEN 0.2 11/09/2019 1323   NITRITE NEGATIVE 02/15/2021 2210   LEUKOCYTESUR NEGATIVE 02/15/2021 2210   Sepsis Labs: @LABRCNTIP (procalcitonin:4,lacticidven:4)  )No results found for this or any previous visit (from the past 240 hour(s)).       Radiology Studies: No results found.      Scheduled Meds:  Chlorhexidine Gluconate Cloth  6 each Topical Daily   feeding supplement  237 mL Oral TID WC   hydrocortisone   Rectal TID   insulin aspart  0-9 Units Subcutaneous Q6H   liver oil-zinc oxide   Topical TID   mirtazapine  7.5 mg Oral QHS   multivitamin with minerals  1 tablet Oral Daily   pantoprazole  40 mg Oral BID   saccharomyces boulardii  250 mg Oral BID   sodium chloride flush  10-40 mL Intracatheter Q12H   Continuous Infusions:  sodium chloride     sodium chloride 65 mL/hr at 02/27/21 0910   ciprofloxacin 400 mg (02/27/21 0914)   metronidazole 500 mg (02/27/21 0915)   TPN ADULT (ION) 60 mL/hr at 02/26/21 1736   TPN ADULT (ION)       LOS: 11 days   The patient is critically ill with multiple organ systems failure and requires high complexity decision making for assessment and support, frequent evaluation and titration of therapies, application of advanced monitoring technologies and extensive interpretation of multiple databases. Critical Care Time devoted to patient care services described in this note  Time spent: 40 minutes     Tiyana Galla, Geraldo Docker, MD [facility]   If 7PM-7AM,  please contact night-coverage 02/27/2021, 10:56 AM

## 2021-02-28 DIAGNOSIS — E162 Hypoglycemia, unspecified: Secondary | ICD-10-CM

## 2021-02-28 DIAGNOSIS — D485 Neoplasm of uncertain behavior of skin: Secondary | ICD-10-CM

## 2021-02-28 DIAGNOSIS — R739 Hyperglycemia, unspecified: Secondary | ICD-10-CM

## 2021-02-28 LAB — COMPREHENSIVE METABOLIC PANEL
ALT: 12 U/L (ref 0–44)
AST: 14 U/L — ABNORMAL LOW (ref 15–41)
Albumin: 3.7 g/dL (ref 3.5–5.0)
Alkaline Phosphatase: 48 U/L (ref 38–126)
Anion gap: 5 (ref 5–15)
BUN: 13 mg/dL (ref 6–20)
CO2: 25 mmol/L (ref 22–32)
Calcium: 8.7 mg/dL — ABNORMAL LOW (ref 8.9–10.3)
Chloride: 108 mmol/L (ref 98–111)
Creatinine, Ser: 0.45 mg/dL (ref 0.44–1.00)
GFR, Estimated: >60 mL/min (ref >60)
Glucose, Bld: 134 mg/dL — ABNORMAL HIGH (ref 70–99)
Potassium: 3.6 mmol/L (ref 3.5–5.1)
Sodium: 138 mmol/L (ref 135–145)
Total Bilirubin: 0.3 mg/dL (ref 0.3–1.2)
Total Protein: 6.3 g/dL — ABNORMAL LOW (ref 6.5–8.1)

## 2021-02-28 LAB — CBC WITH DIFFERENTIAL/PLATELET
Abs Immature Granulocytes: 0.06 10*3/uL (ref 0.00–0.07)
Basophils Absolute: 0.1 10*3/uL (ref 0.0–0.1)
Basophils Relative: 1 %
Eosinophils Absolute: 0.3 10*3/uL (ref 0.0–0.5)
Eosinophils Relative: 3 %
HCT: 39 % (ref 36.0–46.0)
Hemoglobin: 12.4 g/dL (ref 12.0–15.0)
Immature Granulocytes: 1 %
Lymphocytes Relative: 22 %
Lymphs Abs: 1.9 10*3/uL (ref 0.7–4.0)
MCH: 26.9 pg (ref 26.0–34.0)
MCHC: 31.8 g/dL (ref 30.0–36.0)
MCV: 84.6 fL (ref 80.0–100.0)
Monocytes Absolute: 0.9 10*3/uL (ref 0.1–1.0)
Monocytes Relative: 11 %
Neutro Abs: 5.7 10*3/uL (ref 1.7–7.7)
Neutrophils Relative %: 62 %
Platelets: 222 10*3/uL (ref 150–400)
RBC: 4.61 MIL/uL (ref 3.87–5.11)
RDW: 13.7 % (ref 11.5–15.5)
WBC: 9 10*3/uL (ref 4.0–10.5)
nRBC: 0 % (ref 0.0–0.2)

## 2021-02-28 LAB — MAGNESIUM: Magnesium: 2.2 mg/dL (ref 1.7–2.4)

## 2021-02-28 LAB — GLUCOSE, CAPILLARY
Glucose-Capillary: 113 mg/dL — ABNORMAL HIGH (ref 70–99)
Glucose-Capillary: 124 mg/dL — ABNORMAL HIGH (ref 70–99)
Glucose-Capillary: 135 mg/dL — ABNORMAL HIGH (ref 70–99)
Glucose-Capillary: 139 mg/dL — ABNORMAL HIGH (ref 70–99)
Glucose-Capillary: 99 mg/dL (ref 70–99)

## 2021-02-28 LAB — PHOSPHORUS: Phosphorus: 2.9 mg/dL (ref 2.5–4.6)

## 2021-02-28 MED ORDER — ACETAMINOPHEN 325 MG PO TABS
650.0000 mg | ORAL_TABLET | Freq: Once | ORAL | Status: AC
  Administered 2021-02-28: 21:00:00 650 mg via ORAL
  Filled 2021-02-28: qty 2

## 2021-02-28 MED ORDER — METRONIDAZOLE 500 MG PO TABS
500.0000 mg | ORAL_TABLET | Freq: Two times a day (BID) | ORAL | Status: DC
  Administered 2021-02-28 – 2021-03-02 (×4): 500 mg via ORAL
  Filled 2021-02-28 (×4): qty 1

## 2021-02-28 MED ORDER — TRAVASOL 10 % IV SOLN
INTRAVENOUS | Status: AC
  Filled 2021-02-28: qty 960

## 2021-02-28 MED ORDER — CIPROFLOXACIN HCL 500 MG PO TABS
500.0000 mg | ORAL_TABLET | Freq: Two times a day (BID) | ORAL | Status: DC
  Administered 2021-02-28 – 2021-03-02 (×4): 500 mg via ORAL
  Filled 2021-02-28 (×4): qty 1

## 2021-02-28 MED ORDER — DICYCLOMINE HCL 10 MG PO CAPS
10.0000 mg | ORAL_CAPSULE | Freq: Three times a day (TID) | ORAL | Status: DC
  Administered 2021-02-28 – 2021-03-02 (×8): 10 mg via ORAL
  Filled 2021-02-28 (×8): qty 1

## 2021-02-28 NOTE — TOC Progression Note (Signed)
Transition of Care [facility]) - Progression Note    Patient Details  Name: Lisa Donovan MRN: [id number] Date of Birth: 2000-11-08  Transition of Care [facility]) CM/SW Contact  Servando Snare,  Phone Number: 02/28/2021, 12:31 PM  Clinical Narrative:    Faxed additional info per request of [facility] who will provide at home TPN   Expected Discharge Plan: [place] Barriers to Discharge: Continued Medical Work up  Expected Discharge Plan and Services Expected Discharge Plan: [place] arrangements for the past 2 months: Single Family Home                                       Social Determinants of Health (SDOH) Interventions    Readmission Risk Interventions No flowsheet data found.

## 2021-02-28 NOTE — Progress Notes (Signed)
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  Patient noted to have undergoing anesthesia for excision of desmoid tumors on 02/14/2021 and noted following procedure and not had any bowel movement or flatus which was unusual as patient noted to have 5-6 bowel movements daily typically an hour after eating.  Patient presented worsening abdominal pain, nausea, vomiting and noted to be dehydrated.  CT scan done concerning for small bowel obstruction versus ileus.  Patient seen in the ED and attempts were made to transfer patient to [place] (patient general surgeon and gastroenterologist) but due to bed availability patient subsequently admitted to [facility], general surgery consulted for further evaluation.  Patient placed on bowel rest, IV fluids, pain management, supportive care.   Subjective: 12/22 afebrile overnight, states began to eat small amounts of solid food.  Some pain and nausea negative vomiting.     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 -Patient continues with continued complaints of  abdominal pain and rectal pain with bowel movements that feel like burning sensation/reflux, seems to made worse with certain oral meds such as potassium -remains on po oxy and IV dilaudid prn -General Surgery had been following, now signed off. Pt is s/p exam under anesthesia 12/15. Per General Surgery, recommendation for continuing cipro/flagyl empirically to cover pouchitis -Appreciate input by dietitian. Now on TPN -Consulted GI.  -Per GI;Continue IV Abx until Thursday and then change to oral Abx (Cipro/Flagyl) to continue for another 7 days on oral Abx. On TPN and will need that as an outpt. Needs close f/u with Dr. Alisia Ferrari at [place].  [name] -Outpatient follow-up.   Hypokalemia -continue to follow bmet trends -Continued on TPA -12/21 resolved  Moderate malnutrition - Continue TPN -12/21 TPN ordered through home health -12/22 regular diet in moderation  Hyperglycemia/Hypoglycemia - Patient with labile CBG - 12/22 order home glucometer     DVT prophylaxis: SCD  Code Status: Full Family Communication: 12/22 mother at bedside for discussion of plan of care all questions answered Status is: Inpatient    Dispo: The patient is from: Home              Anticipated d/c is to: Home              Anticipated d/c date is: 2 days              Patient currently is not medically stable to d/c.      Consultants:  GI Dr. Michail Sermon   Procedures/Significant Events:     I have personally reviewed and interpreted all radiology studies and my findings are as above.  VENTILATOR SETTINGS:    Cultures   Antimicrobials: Anti-infectives (From admission, onward)  Start     Ordered Stop   02/28/21 2200  metroNIDAZOLE (FLAGYL) tablet 500 mg        02/28/21 1944     02/28/21 2030  ciprofloxacin (CIPRO) tablet 500 mg        02/28/21 1944     02/22/21 1000  ciprofloxacin (CIPRO) IVPB 400 mg  Status:  Discontinued        02/22/21 0943 02/28/21 1944   02/22/21 1000   metroNIDAZOLE (FLAGYL) IVPB 500 mg  Status:  Discontinued        02/22/21 0944 02/28/21 1944   02/21/21 0600  cefoTEtan (CEFOTAN) 2 g in sodium chloride 0.9 % 100 mL IVPB       Note to Pharmacy: Pharmacy may adjust dose strength for optimal dosing.   Send with patient on call to the OR.  Anesthesia to complete antibiotic administration <86min prior to incision per [facility].   02/20/21 1635 02/21/21 1336        Devices    LINES / TUBES:      Continuous Infusions:  sodium chloride 45 mL/hr at 02/28/21 1750   TPN ADULT (ION) 80 mL/hr at 02/28/21 1657     Objective: Vitals:   02/27/21 1259 02/27/21 2105 02/28/21 0614 02/28/21 1354  BP: 116/60 105/64 116/68 118/71  Pulse: 63 72 81 74  Resp: 14 18 18 18   Temp: 97.8 F (36.6 C) 98.4 F (36.9 C) 98.1 F (36.7 C) 98.4 F (36.9 C)  TempSrc: Oral Oral Oral Oral  SpO2: 99% 99% 97% 99%  Weight:      Height:        Intake/Output Summary (Last 24 hours) at 02/28/2021 1947 Last data filed at 02/28/2021 1803 Gross per 24 hour  Intake 4326.74 ml  Output 2800 ml  Net 1526.74 ml   Filed Weights   02/22/21 0500 02/23/21 0536 02/26/21 0500  Weight: 50.8 kg 52.1 kg 51 kg    Examination:  General: A/O x4, No acute respiratory distress, cachectic Eyes: negative scleral hemorrhage, negative anisocoria, negative icterus ENT: Negative Runny nose, negative gingival bleeding, Neck:  Negative scars, masses, torticollis, lymphadenopathy, JVD Lungs: Clear to auscultation bilaterally without wheezes or crackles Cardiovascular: Regular rate and rhythm without murmur gallop or rub normal S1 and S2 Abdomen: Positive abdominal pain RLQ/LLQ, nondistended, positive soft, bowel sounds, no rebound, no ascites, no appreciable mass Extremities: No significant cyanosis, clubbing, or edema bilateral lower extremities Skin: Negative rashes, lesions, ulcers Psychiatric:  Negative depression, negative anxiety, negative fatigue, negative mania   Central nervous system:  Cranial nerves II through XII intact, tongue/uvula midline, all extremities muscle strength 5/5, sensation intact throughout,negative dysarthria, negative expressive aphasia, negative receptive aphasia.  .     Data Reviewed: Care during the described time interval was provided by me .  I have reviewed this patient's available data, including medical history, events of note, physical examination, and all test results as part of my evaluation.   CBC: Recent Labs  Lab 02/22/21 0441 02/23/21 0412 02/24/21 0457 02/25/21 0401 02/28/21 0425  WBC 8.1 8.0 7.9 8.2 9.0  NEUTROABS  --   --   --   --  5.7  HGB 12.7 11.5* 12.1 12.9 12.4  HCT 38.8 35.9* 38.6 40.8 39.0  MCV 82.4 84.3 84.8 84.0 84.6  PLT 246 214 231 233 700   Basic Metabolic Panel: Recent Labs  Lab 02/24/21 0457 02/25/21 0401 02/26/21 1202 02/27/21 0432 02/28/21 0425  NA 138 137 137 137 138  K  3.6 3.4* 3.5 3.7 3.6  CL 105 108 106 106 108  CO2 25 23 26 26 25   GLUCOSE 98 111* 133* 125* 134*  BUN <5* 5* 10 12 13   CREATININE 0.60 0.49 0.59 0.50 0.45  CALCIUM 8.8* 8.9 8.7* 8.6* 8.7*  MG  --  2.0 2.3 2.3 2.2  PHOS  --  4.2 3.8 3.7 2.9   GFR: Estimated Creatinine Clearance: 89.6 mL/min (by C-G formula based on SCr of 0.45 mg/dL). Liver Function Tests: Recent Labs  Lab 02/22/21 0441 02/23/21 0412 02/24/21 0457 02/25/21 0401 02/28/21 0425  AST 12* 9* 10* 12* 14*  ALT 19 14 13 14 12   ALKPHOS 41 38 40 42 48  BILITOT 0.6 0.5 0.5 0.6 0.3  PROT 6.2* 5.5* 5.9* 6.2* 6.3*  ALBUMIN 3.6 3.3* 3.4* 3.7 3.7   No results for input(s): LIPASE, AMYLASE in the last 168 hours. No results for input(s): AMMONIA in the last 168 hours. Coagulation Profile: No results for input(s): INR, PROTIME in the last 168 hours. Cardiac Enzymes: No results for input(s): CKTOTAL, CKMB, CKMBINDEX, TROPONINI in the last 168 hours. BNP (last 3 results) No results for input(s): PROBNP in the last 8760 hours. HbA1C: No  results for input(s): HGBA1C in the last 72 hours. CBG: Recent Labs  Lab 02/27/21 1823 02/27/21 2338 02/28/21 0610 02/28/21 1120 02/28/21 1803  GLUCAP 135* 152* 135* 99 139*   Lipid Profile: No results for input(s): CHOL, HDL, LDLCALC, TRIG, CHOLHDL, LDLDIRECT in the last 72 hours.  Thyroid Function Tests: No results for input(s): TSH, T4TOTAL, FREET4, T3FREE, THYROIDAB in the last 72 hours. Anemia Panel: No results for input(s): VITAMINB12, FOLATE, FERRITIN, TIBC, IRON, RETICCTPCT in the last 72 hours. Urine analysis:    Component Value Date/Time   COLORURINE YELLOW 02/15/2021 2210   APPEARANCEUR CLEAR 02/15/2021 2210   LABSPEC 1.011 02/15/2021 2210   PHURINE 7.5 02/15/2021 2210   GLUCOSEU NEGATIVE 02/15/2021 2210   HGBUR NEGATIVE 02/15/2021 2210   BILIRUBINUR NEGATIVE 02/15/2021 2210   BILIRUBINUR Negative 11/09/2019 1323   KETONESUR NEGATIVE 02/15/2021 2210   PROTEINUR NEGATIVE 02/15/2021 2210   UROBILINOGEN 0.2 11/09/2019 1323   NITRITE NEGATIVE 02/15/2021 2210   LEUKOCYTESUR NEGATIVE 02/15/2021 2210   Sepsis Labs: @LABRCNTIP (procalcitonin:4,lacticidven:4)  )No results found for this or any previous visit (from the past 240 hour(s)).       Radiology Studies: No results found.      Scheduled Meds:  Chlorhexidine Gluconate Cloth  6 each Topical Daily   ciprofloxacin  500 mg Oral BID   dicyclomine  10 mg Oral TID AC & HS   feeding supplement  237 mL Oral TID WC   hydrocortisone   Rectal TID   insulin aspart  0-9 Units Subcutaneous Q6H   liver oil-zinc oxide   Topical TID   metroNIDAZOLE  500 mg Oral Q12H   mirtazapine  7.5 mg Oral QHS   multivitamin with minerals  1 tablet Oral Daily   pantoprazole  40 mg Oral BID   saccharomyces boulardii  250 mg Oral BID   sodium chloride flush  10-40 mL Intracatheter Q12H   Continuous Infusions:  sodium chloride 45 mL/hr at 02/28/21 1750   TPN ADULT (ION) 80 mL/hr at 02/28/21 1657     LOS: 12 days   The  patient is critically ill with multiple organ systems failure and requires high complexity decision making for assessment and support, frequent evaluation and titration of therapies, application of advanced monitoring technologies and extensive interpretation  of multiple databases. Critical Care Time devoted to patient care services described in this note  Time spent: 40 minutes     Gigi Onstad, Geraldo Docker, MD [facility]   If 7PM-7AM, please contact night-coverage 02/28/2021, 7:47 PM

## 2021-02-28 NOTE — Progress Notes (Signed)
PHARMACY - TOTAL PARENTERAL NUTRITION CONSULT NOTE   Indication:  Intolerance to Enteral Feeding  Patient Measurements: Height: 5\' 5"  (165.1 cm) Weight: 51 kg (112 lb 6.4 oz) IBW/kg (Calculated) : 57 TPN AdjBW (KG): 53.1 Body mass index is 18.7 kg/m.  Assessment: 62 yoF with partial small bowel obstruction on 12/9.  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  She is s/p CCS exam on 12/15 with no significant findings or surgical indications.   She currently c/o pain with BMs and bloating.  Limited PO intake.  RD recommend initiation of TPN given suspected continued severe malnutrition, and poor PO since admission (12/9 -9 days ago).   Glucose / Insulin: No hx DM.  CBGs 99-152.  4 units SSI / 24 hrs.  Electrolytes: Lytes WNL, CorrCa near normal at 8.94.  - Phos decreased 3.7 down to 2.9 Renal: SCr, BUN WNL Hepatic: LFTs and Tbili WNL.  Albumin and Prealbumin WNL.  TG 64 Intake / Output; MIVF: I/O net -600 mL Intake: NS at 45 ml/hr Output: Urine 4400 mL, no stool charted GI Imaging:  12/16 CT: no evidence of intestinal obstruction or pneumoperitoneum.No hydronephrosis.  Previous subtotal colectomy. Oral contrast has reached the rectum.  Central access: PICC (12/16) TPN start date: 12/19  Nutritional Goals: Goal TPN rate is 80 mL/hr (provides 96 g of protein and 1843 kcals per day)  RD Assessment: Estimated Needs Total Energy Estimated Needs: 1650-1850 Total Protein Estimated Needs: 85-100g Total Fluid Estimated Needs: 1.9L/day  Current Nutrition:  Soft diet  Ensure TID TPN to goal rate on 12/21  Plan:  Continue TPN at goal rate 80 mL/hr at 1800 Standard Electrolytes in TPN: Na 80mEq/L, K 64mEq/L, Ca 57mEq/L, Mg 61mEq/L, and Phos 32mmol/L. Cl:Ac 1:1 Continue PO multivitamin. Continue CBGs and  Sensitive SSI q6h and adjust as needed   Continue IVF 45 mL/hr  Monitor TPN labs on Mon/Thurs - daily labs ordered per MD Planning for discharge with TPN on Friday.  Lennart Pall LCSW faxed current formula on 12/21 to "[facility]" company that previously managed her TPN and has confirmed that they will again supply her TPN.    Gretta Arab PharmD, BCPS Clinical Pharmacist [facility] main pharmacy [phone] 02/28/2021 11:37 AM

## 2021-03-01 DIAGNOSIS — Z932 Ileostomy status: Secondary | ICD-10-CM | POA: Diagnosis not present

## 2021-03-01 DIAGNOSIS — R1084 Generalized abdominal pain: Secondary | ICD-10-CM | POA: Diagnosis not present

## 2021-03-01 DIAGNOSIS — E860 Dehydration: Secondary | ICD-10-CM | POA: Diagnosis not present

## 2021-03-01 DIAGNOSIS — R112 Nausea with vomiting, unspecified: Secondary | ICD-10-CM | POA: Diagnosis not present

## 2021-03-01 LAB — COMPREHENSIVE METABOLIC PANEL
ALT: 13 U/L (ref 0–44)
AST: 15 U/L (ref 15–41)
Albumin: 3.7 g/dL (ref 3.5–5.0)
Alkaline Phosphatase: 41 U/L (ref 38–126)
Anion gap: 5 (ref 5–15)
BUN: 15 mg/dL (ref 6–20)
CO2: 26 mmol/L (ref 22–32)
Calcium: 8.7 mg/dL — ABNORMAL LOW (ref 8.9–10.3)
Chloride: 109 mmol/L (ref 98–111)
Creatinine, Ser: 0.37 mg/dL — ABNORMAL LOW (ref 0.44–1.00)
GFR, Estimated: >60 mL/min (ref >60)
Glucose, Bld: 125 mg/dL — ABNORMAL HIGH (ref 70–99)
Potassium: 3.8 mmol/L (ref 3.5–5.1)
Sodium: 140 mmol/L (ref 135–145)
Total Bilirubin: 0.3 mg/dL (ref 0.3–1.2)
Total Protein: 6 g/dL — ABNORMAL LOW (ref 6.5–8.1)

## 2021-03-01 LAB — CBC WITH DIFFERENTIAL/PLATELET
Abs Immature Granulocytes: 0.04 10*3/uL (ref 0.00–0.07)
Basophils Absolute: 0.1 10*3/uL (ref 0.0–0.1)
Basophils Relative: 1 %
Eosinophils Absolute: 0.2 10*3/uL (ref 0.0–0.5)
Eosinophils Relative: 3 %
HCT: 37.8 % (ref 36.0–46.0)
Hemoglobin: 11.8 g/dL — ABNORMAL LOW (ref 12.0–15.0)
Immature Granulocytes: 0 %
Lymphocytes Relative: 21 %
Lymphs Abs: 1.9 10*3/uL (ref 0.7–4.0)
MCH: 26.5 pg (ref 26.0–34.0)
MCHC: 31.2 g/dL (ref 30.0–36.0)
MCV: 84.9 fL (ref 80.0–100.0)
Monocytes Absolute: 1 10*3/uL (ref 0.1–1.0)
Monocytes Relative: 11 %
Neutro Abs: 5.8 10*3/uL (ref 1.7–7.7)
Neutrophils Relative %: 64 %
Platelets: 216 10*3/uL (ref 150–400)
RBC: 4.45 MIL/uL (ref 3.87–5.11)
RDW: 13.8 % (ref 11.5–15.5)
WBC: 9.1 10*3/uL (ref 4.0–10.5)
nRBC: 0 % (ref 0.0–0.2)

## 2021-03-01 LAB — GLUCOSE, CAPILLARY
Glucose-Capillary: 118 mg/dL — ABNORMAL HIGH (ref 70–99)
Glucose-Capillary: 120 mg/dL — ABNORMAL HIGH (ref 70–99)
Glucose-Capillary: 124 mg/dL — ABNORMAL HIGH (ref 70–99)
Glucose-Capillary: 137 mg/dL — ABNORMAL HIGH (ref 70–99)

## 2021-03-01 LAB — MAGNESIUM: Magnesium: 2.2 mg/dL (ref 1.7–2.4)

## 2021-03-01 LAB — PHOSPHORUS: Phosphorus: 3.1 mg/dL (ref 2.5–4.6)

## 2021-03-01 MED ORDER — TRAVASOL 10 % IV SOLN
INTRAVENOUS | Status: DC
  Filled 2021-03-01: qty 960

## 2021-03-01 NOTE — Progress Notes (Addendum)
PHARMACY - TOTAL PARENTERAL NUTRITION CONSULT NOTE   Indication:  Intolerance to Enteral Feeding  Patient Measurements: Height: 5\' 5"  (165.1 cm) Weight: 51 kg (112 lb 6.4 oz) IBW/kg (Calculated) : 57 TPN AdjBW (KG): 53.1 Body mass index is 18.7 kg/m.  Assessment: 28 yoF with partial small bowel obstruction on 12/9.  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  She is s/p CCS exam on 12/15 with no significant findings or surgical indications.   She currently c/o pain with BMs and bloating.  Limited PO intake.  RD recommend initiation of TPN given suspected continued severe malnutrition, and poor PO since admission (12/9 -9 days ago).   Glucose / Insulin: No hx DM.  CBGs 99-137.  Patient has been refusing SSI Electrolytes: Lytes WNL, CorrCa near normal at 8.9.  - Phos up to 3.1 Renal: SCr, BUN WNL Hepatic: LFTs and Tbili WNL.  Albumin and Prealbumin WNL.  TG 64 Intake / Output; MIVF: I/O net -600 mL Intake: NS at 45 ml/hr Output: Urine 2200 mL, no stool charted GI Imaging:  12/16 CT: no evidence of intestinal obstruction or pneumoperitoneum.No hydronephrosis.  Previous subtotal colectomy. Oral contrast has reached the rectum.  Central access: PICC (12/16) TPN start date: 12/19  Nutritional Goals: Goal TPN rate is 80 mL/hr (provides 96 g of protein and 1843 kcals per day)  RD Assessment: Estimated Needs Total Energy Estimated Needs: 1650-1850 Total Protein Estimated Needs: 85-100g Total Fluid Estimated Needs: 1.9L/day  Current Nutrition:  Regular diet - thin   Ensure TID TPN to goal rate on 12/21  Plan:  - No changes to TPN  - Signed and held home health orders for [facility] RN - Patient will discharge with TPN on 12/23.  Lennart Pall LCSW faxed current formula on 12/21 to "[facility]" company  that previously managed her TPN and has confirmed that they will again supply her TPN.    Formula for TPN: Continue TPN at goal rate 80 mL/hr at 1800 -Provides 96g AA (Travasol), 48g lipids, and 288g dextrose Standard Electrolytes in TPN: Na 75mEq/L, K 30mEq/L, Ca 110mEq/L, Mg 45mEq/L, and Phos 11mmol/L. Cl:Ac 1:1 Continue PO multivitamin. Continue CBGs and Sensitive SSI q6h and adjust as needed   Continue IVF 45 mL/hr  Monitor TPN labs on Mon/Thurs - daily labs ordered per MD   Dimple Nanas, PharmD 03/01/2021 11:26 AM  ADDENDUM: -Patient was supposed to discharge home on 12/23 and [facility] was to provide TPN, however, was informed that patient does not have power at her home currently so she will not be discharging today. -Ordered TPN bag for tonight (see above)  Dimple Nanas, PharmD 03/01/2021 12:51 PM

## 2021-03-01 NOTE — TOC Progression Note (Signed)
Transition of Care [facility]) - Progression Note    Patient Details  Name: Lisa Donovan MRN: [id number] Date of Birth: [birth year]  Transition of Care [facility] CM/SW Contact  Joaquin Courts, RN Phone Number: 03/01/2021, 4:01 PM  Clinical Narrative:    CM spoke with care coordinator with [facility] and forwarded [place] orders and requested lab work.  Confirmed [facility] plans to deliver TPN, supplies, and equipment to bedside today at 5 pm.  Met with patient and mother at bedside.  Mother confirms she is in contact with [facility] regarding the delivery, states she has outreached to [facility] with request to deliver directly to the home.  [facility] has not confirmed this yet.  Mother discussed that if [facility] does not deliver directly to the home per request, the patient's father may come to the hospital and pick up all supplies today for an anticipated discharge tomorrow.  Should the father be unable to pick up the supplies, equipment, and medication then the back up plan will be to store the home TPN in our pharmacy until dc in am.  This plan was discussed with the bedside RN.   Expected Discharge Plan: [place] Barriers to Discharge: Continued Medical Work up  Expected Discharge Plan and Services Expected Discharge Plan: [place] arrangements for the past 2 months: Single Family Home                                       Social Determinants of Health (SDOH) Interventions    Readmission Risk Interventions No flowsheet data found.

## 2021-03-01 NOTE — Progress Notes (Signed)
PROGRESS NOTE    Lisa Donovan  EGB:[id number] DOB: Dec 13, 2000 DOA: 02/15/2021 PCP: Inda Coke, PA   Brief Narrative:  20 year old female history of [name], [name] status post proctocolectomy with J-pouch June 2022 with diverting loop ileostomy reversal August 2022.  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  Patient noted to have undergoing anesthesia for excision of desmoid tumors on 02/14/2021 and noted following procedure and not had any bowel movement or flatus which was unusual as patient noted to have 5-6 bowel movements daily typically an hour after eating.  Patient presented worsening abdominal pain, nausea, vomiting and noted to be dehydrated.  CT scan done concerning for small bowel obstruction versus ileus.  Patient seen in the ED and attempts were made to transfer patient to [place] (patient general surgeon and gastroenterologist) but due to bed availability patient subsequently admitted to [facility], general surgery consulted for further evaluation.  Patient placed on bowel rest, IV fluids, pain management, supportive care.   Subjective: 12/23 A/O x4.  Having some pain/cramping postprandial.  Equipment for TPN not delivered last night to mother therefore cannot discharge today.     Assessment & Plan:  Covid vaccination; unvaccinated  Principal Problem:   SBO (small bowel obstruction) ([place]) Active Problems:   [name] of skin   Moderate malnutrition ([place])   Hypoglycemia   Hyperglycemia   Partial SBO -Patient with history of [name] status post proctocolectomy with J-pouch June 2022 with diverting loop ileostomy reversal August 2022 presenting with nausea vomiting worsening abdominal pain with imaging findings concerning for distal partial small bowel obstruction. -Patient continues with  continued complaints of abdominal pain and rectal pain with bowel movements that feel like burning sensation/reflux, seems to made worse with certain oral meds such as potassium -remains on po oxy and IV dilaudid prn -General Surgery had been following, now signed off. Pt is s/p exam under anesthesia 12/15. Per General Surgery, recommendation for continuing cipro/flagyl empirically to cover pouchitis -Appreciate input by dietitian. Now on TPN -Consulted GI.  -Per GI;Continue IV Abx until Thursday and then change to oral Abx (Cipro/Flagyl) to continue for another 7 days on oral Abx. On TPN and will need that as an outpt. Needs close f/u with Dr. Alisia Ferrari at [place]. -12/23 improving patient is prepped to go home however awaiting delivery of TPN equipment to mother and power out at their house currently.  Should be able to discharge on 12/24  [name] -Outpatient follow-up.   Hypokalemia -continue to follow bmet trends -Continued on TPA -12/21 resolved  Moderate malnutrition - Continue TPN -12/21 TPN ordered through home health -12/22 regular diet in moderation  Hyperglycemia/Hypoglycemia - Patient with labile CBG - 12/22 order home glucometer     DVT prophylaxis: SCD  Code Status: Full Family Communication: 12/23 mother at bedside for discussion of plan of care all questions answered Status is: Inpatient    Dispo: The patient is from: Home              Anticipated d/c is to: Home              Anticipated d/c date is: 2 days              Patient currently is not medically stable to d/c.      Consultants:  GI Dr. Michail Sermon   Procedures/Significant Events:  I have personally reviewed and interpreted all radiology studies and my findings are as above.  VENTILATOR SETTINGS:    Cultures   Antimicrobials: Anti-infectives (From admission, onward)    Start     Ordered Stop   02/28/21 2200  metroNIDAZOLE (FLAGYL) tablet 500 mg        02/28/21 1944      02/28/21 2030  ciprofloxacin (CIPRO) tablet 500 mg        02/28/21 1944     02/22/21 1000  ciprofloxacin (CIPRO) IVPB 400 mg  Status:  Discontinued        02/22/21 0943 02/28/21 1944   02/22/21 1000  metroNIDAZOLE (FLAGYL) IVPB 500 mg  Status:  Discontinued        02/22/21 0944 02/28/21 1944   02/21/21 0600  cefoTEtan (CEFOTAN) 2 g in sodium chloride 0.9 % 100 mL IVPB       Note to Pharmacy: Pharmacy may adjust dose strength for optimal dosing.   Send with patient on call to the OR.  Anesthesia to complete antibiotic administration <102min prior to incision per [facility].   02/20/21 1635 02/21/21 1336        Devices    LINES / TUBES:      Continuous Infusions:  sodium chloride 45 mL/hr at 03/01/21 1819   TPN ADULT (ION) 80 mL/hr at 03/01/21 1819     Objective: Vitals:   02/28/21 2114 03/01/21 0504 03/01/21 1514 03/01/21 1740  BP: 115/72 (!) 100/56 117/64   Pulse: 84 67 79   Resp: 18  14   Temp: 99 F (37.2 C) 98.1 F (36.7 C)  98.3 F (36.8 C)  TempSrc: Oral Oral  Oral  SpO2: 99% 99% 99%   Weight:      Height:        Intake/Output Summary (Last 24 hours) at 03/01/2021 2146 Last data filed at 03/01/2021 1819 Gross per 24 hour  Intake 3977.97 ml  Output 1600 ml  Net 2377.97 ml    Filed Weights   02/22/21 0500 02/23/21 0536 02/26/21 0500  Weight: 50.8 kg 52.1 kg 51 kg    Examination:  General: A/O x4, No acute respiratory distress, cachectic Eyes: negative scleral hemorrhage, negative anisocoria, negative icterus ENT: Negative Runny nose, negative gingival bleeding, Neck:  Negative scars, masses, torticollis, lymphadenopathy, JVD Lungs: Clear to auscultation bilaterally without wheezes or crackles Cardiovascular: Regular rate and rhythm without murmur gallop or rub normal S1 and S2 Abdomen: Positive mild abdominal pain RLQ/LLQ, nondistended, positive soft, bowel sounds, no rebound, no ascites, no appreciable mass Extremities: No significant cyanosis,  clubbing, or edema bilateral lower extremities Skin: Negative rashes, lesions, ulcers Psychiatric:  Negative depression, negative anxiety, negative fatigue, negative mania  Central nervous system:  Cranial nerves II through XII intact, tongue/uvula midline, all extremities muscle strength 5/5, sensation intact throughout,negative dysarthria, negative expressive aphasia, negative receptive aphasia.  .     Data Reviewed: Care during the described time interval was provided by me .  I have reviewed this patient's available data, including medical history, events of note, physical examination, and all test results as part of my evaluation.   CBC: Recent Labs  Lab 02/23/21 0412 02/24/21 0457 02/25/21 0401 02/28/21 0425 03/01/21 0427  WBC 8.0 7.9 8.2 9.0 9.1  NEUTROABS  --   --   --  5.7 5.8  HGB 11.5* 12.1 12.9 12.4 11.8*  HCT 35.9* 38.6 40.8 39.0 37.8  MCV 84.3 84.8 84.0 84.6 84.9  PLT 214 231 233  222 284    Basic Metabolic Panel: Recent Labs  Lab 02/25/21 0401 02/26/21 1202 02/27/21 0432 02/28/21 0425 03/01/21 0427  NA 137 137 137 138 140  K 3.4* 3.5 3.7 3.6 3.8  CL 108 106 106 108 109  CO2 23 26 26 25 26   GLUCOSE 111* 133* 125* 134* 125*  BUN 5* 10 12 13 15   CREATININE 0.49 0.59 0.50 0.45 0.37*  CALCIUM 8.9 8.7* 8.6* 8.7* 8.7*  MG 2.0 2.3 2.3 2.2 2.2  PHOS 4.2 3.8 3.7 2.9 3.1    GFR: Estimated Creatinine Clearance: 89.6 mL/min (A) (by C-G formula based on SCr of 0.37 mg/dL (L)). Liver Function Tests: Recent Labs  Lab 02/23/21 0412 02/24/21 0457 02/25/21 0401 02/28/21 0425 03/01/21 0427  AST 9* 10* 12* 14* 15  ALT 14 13 14 12 13   ALKPHOS 38 40 42 48 41  BILITOT 0.5 0.5 0.6 0.3 0.3  PROT 5.5* 5.9* 6.2* 6.3* 6.0*  ALBUMIN 3.3* 3.4* 3.7 3.7 3.7    No results for input(s): LIPASE, AMYLASE in the last 168 hours. No results for input(s): AMMONIA in the last 168 hours. Coagulation Profile: No results for input(s): INR, PROTIME in the last 168 hours. Cardiac  Enzymes: No results for input(s): CKTOTAL, CKMB, CKMBINDEX, TROPONINI in the last 168 hours. BNP (last 3 results) No results for input(s): PROBNP in the last 8760 hours. HbA1C: No results for input(s): HGBA1C in the last 72 hours. CBG: Recent Labs  Lab 02/28/21 2008 02/28/21 2335 03/01/21 0505 03/01/21 1143 03/01/21 1844  GLUCAP 113* 124* 137* 118* 124*    Lipid Profile: No results for input(s): CHOL, HDL, LDLCALC, TRIG, CHOLHDL, LDLDIRECT in the last 72 hours.  Thyroid Function Tests: No results for input(s): TSH, T4TOTAL, FREET4, T3FREE, THYROIDAB in the last 72 hours. Anemia Panel: No results for input(s): VITAMINB12, FOLATE, FERRITIN, TIBC, IRON, RETICCTPCT in the last 72 hours. Urine analysis:    Component Value Date/Time   COLORURINE YELLOW 02/15/2021 2210   APPEARANCEUR CLEAR 02/15/2021 2210   LABSPEC 1.011 02/15/2021 2210   PHURINE 7.5 02/15/2021 2210   GLUCOSEU NEGATIVE 02/15/2021 2210   HGBUR NEGATIVE 02/15/2021 2210   BILIRUBINUR NEGATIVE 02/15/2021 2210   BILIRUBINUR Negative 11/09/2019 1323   KETONESUR NEGATIVE 02/15/2021 2210   PROTEINUR NEGATIVE 02/15/2021 2210   UROBILINOGEN 0.2 11/09/2019 1323   NITRITE NEGATIVE 02/15/2021 2210   LEUKOCYTESUR NEGATIVE 02/15/2021 2210   Sepsis Labs: @LABRCNTIP (procalcitonin:4,lacticidven:4)  )No results found for this or any previous visit (from the past 240 hour(s)).       Radiology Studies: No results found.      Scheduled Meds:  Chlorhexidine Gluconate Cloth  6 each Topical Daily   ciprofloxacin  500 mg Oral BID   dicyclomine  10 mg Oral TID AC & HS   feeding supplement  237 mL Oral TID WC   hydrocortisone   Rectal TID   insulin aspart  0-9 Units Subcutaneous Q6H   liver oil-zinc oxide   Topical TID   metroNIDAZOLE  500 mg Oral Q12H   mirtazapine  7.5 mg Oral QHS   multivitamin with minerals  1 tablet Oral Daily   pantoprazole  40 mg Oral BID   saccharomyces boulardii  250 mg Oral BID   sodium  chloride flush  10-40 mL Intracatheter Q12H   Continuous Infusions:  sodium chloride 45 mL/hr at 03/01/21 1819   TPN ADULT (ION) 80 mL/hr at 03/01/21 1819     LOS: 13 days   The patient  is critically ill with multiple organ systems failure and requires high complexity decision making for assessment and support, frequent evaluation and titration of therapies, application of advanced monitoring technologies and extensive interpretation of multiple databases. Critical Care Time devoted to patient care services described in this note  Time spent: 40 minutes     Neely Cecena, Geraldo Docker, MD [facility]   If 7PM-7AM, please contact night-coverage 03/01/2021, 9:46 PM

## 2021-03-02 DIAGNOSIS — R1084 Generalized abdominal pain: Secondary | ICD-10-CM | POA: Diagnosis not present

## 2021-03-02 DIAGNOSIS — E860 Dehydration: Secondary | ICD-10-CM | POA: Diagnosis not present

## 2021-03-02 DIAGNOSIS — D649 Anemia, unspecified: Secondary | ICD-10-CM

## 2021-03-02 DIAGNOSIS — Z932 Ileostomy status: Secondary | ICD-10-CM | POA: Diagnosis not present

## 2021-03-02 DIAGNOSIS — R112 Nausea with vomiting, unspecified: Secondary | ICD-10-CM | POA: Diagnosis not present

## 2021-03-02 LAB — CBC WITH DIFFERENTIAL/PLATELET
Abs Immature Granulocytes: 0.04 10*3/uL (ref 0.00–0.07)
Basophils Absolute: 0.1 10*3/uL (ref 0.0–0.1)
Basophils Relative: 1 %
Eosinophils Absolute: 0.2 10*3/uL (ref 0.0–0.5)
Eosinophils Relative: 2 %
HCT: 38.7 % (ref 36.0–46.0)
Hemoglobin: 12.1 g/dL (ref 12.0–15.0)
Immature Granulocytes: 0 %
Lymphocytes Relative: 19 %
Lymphs Abs: 1.8 10*3/uL (ref 0.7–4.0)
MCH: 26.8 pg (ref 26.0–34.0)
MCHC: 31.3 g/dL (ref 30.0–36.0)
MCV: 85.6 fL (ref 80.0–100.0)
Monocytes Absolute: 1.3 10*3/uL — ABNORMAL HIGH (ref 0.1–1.0)
Monocytes Relative: 14 %
Neutro Abs: 6 10*3/uL (ref 1.7–7.7)
Neutrophils Relative %: 64 %
Platelets: 196 10*3/uL (ref 150–400)
RBC: 4.52 MIL/uL (ref 3.87–5.11)
RDW: 13.9 % (ref 11.5–15.5)
WBC: 9.4 10*3/uL (ref 4.0–10.5)
nRBC: 0 % (ref 0.0–0.2)

## 2021-03-02 LAB — COMPREHENSIVE METABOLIC PANEL
ALT: 14 U/L (ref 0–44)
AST: 15 U/L (ref 15–41)
Albumin: 3.6 g/dL (ref 3.5–5.0)
Alkaline Phosphatase: 39 U/L (ref 38–126)
Anion gap: 7 (ref 5–15)
BUN: 12 mg/dL (ref 6–20)
CO2: 25 mmol/L (ref 22–32)
Calcium: 8.8 mg/dL — ABNORMAL LOW (ref 8.9–10.3)
Chloride: 108 mmol/L (ref 98–111)
Creatinine, Ser: 0.45 mg/dL (ref 0.44–1.00)
GFR, Estimated: >60 mL/min (ref >60)
Glucose, Bld: 108 mg/dL — ABNORMAL HIGH (ref 70–99)
Potassium: 3.8 mmol/L (ref 3.5–5.1)
Sodium: 140 mmol/L (ref 135–145)
Total Bilirubin: 0.4 mg/dL (ref 0.3–1.2)
Total Protein: 6.2 g/dL — ABNORMAL LOW (ref 6.5–8.1)

## 2021-03-02 LAB — MAGNESIUM: Magnesium: 2.3 mg/dL (ref 1.7–2.4)

## 2021-03-02 LAB — GLUCOSE, CAPILLARY
Glucose-Capillary: 138 mg/dL — ABNORMAL HIGH (ref 70–99)
Glucose-Capillary: 141 mg/dL — ABNORMAL HIGH (ref 70–99)

## 2021-03-02 LAB — PHOSPHORUS: Phosphorus: 3.2 mg/dL (ref 2.5–4.6)

## 2021-03-02 MED ORDER — ADULT MULTIVITAMIN W/MINERALS CH: 1.0000 | ORAL_TABLET | Freq: Every day | ORAL | 0 refills | Status: DC

## 2021-03-02 MED ORDER — HEPARIN SOD (PORK) LOCK FLUSH 100 UNIT/ML IV SOLN
250.0000 [IU] | INTRAVENOUS | Status: AC | PRN
  Administered 2021-03-02: 16:00:00 250 [IU]
  Filled 2021-03-02: qty 2.5

## 2021-03-02 MED ORDER — BLOOD GLUCOSE MONITOR KIT: PACK | 0 refills | Status: DC

## 2021-03-02 MED ORDER — DICYCLOMINE HCL 10 MG PO CAPS: 10.0000 mg | ORAL_CAPSULE | Freq: Three times a day (TID) | ORAL | 0 refills | Status: DC

## 2021-03-02 MED ORDER — SODIUM CHLORIDE 0.9% FLUSH: 10.0000 mL | Freq: Two times a day (BID) | INTRAVENOUS | 0 refills | Status: AC

## 2021-03-02 MED ORDER — ENSURE ENLIVE PO LIQD: 237.0000 mL | Freq: Three times a day (TID) | ORAL | 12 refills | Status: DC

## 2021-03-02 MED ORDER — HYDROCORTISONE (PERIANAL) 2.5 % EX CREA: TOPICAL_CREAM | Freq: Three times a day (TID) | CUTANEOUS | 0 refills | Status: AC

## 2021-03-02 MED ORDER — SACCHAROMYCES BOULARDII 250 MG PO CAPS: 250.0000 mg | ORAL_CAPSULE | Freq: Two times a day (BID) | ORAL | 0 refills | Status: AC

## 2021-03-02 MED ORDER — CIPROFLOXACIN HCL 500 MG PO TABS
500.0000 mg | ORAL_TABLET | Freq: Two times a day (BID) | ORAL | 0 refills | Status: AC
Start: 2021-03-02 — End: 2021-03-08

## 2021-03-02 MED ORDER — OXYCODONE HCL 5 MG PO TABS: 5.0000 mg | ORAL_TABLET | Freq: Four times a day (QID) | ORAL | 0 refills | Status: AC | PRN

## 2021-03-02 MED ORDER — PANTOPRAZOLE SODIUM 40 MG PO TBEC
40.0000 mg | DELAYED_RELEASE_TABLET | Freq: Two times a day (BID) | ORAL | 0 refills | Status: DC
Start: 2021-03-02 — End: 2021-11-15

## 2021-03-02 MED ORDER — METRONIDAZOLE 500 MG PO TABS: 500.0000 mg | ORAL_TABLET | Freq: Two times a day (BID) | ORAL | 0 refills | Status: AC

## 2021-03-02 NOTE — TOC Transition Note (Signed)
Transition of Care [facility]) - CM/SW Discharge Note  Patient Details  Name: Lisa Donovan MRN: [id number] Date of Birth: Sep 09, 2000  Transition of [facility]) CM/SW Contact:  Sherie Don, LCSW Phone Number: 03/02/2021, 9:08 AM  Clinical Narrative: CSW confirmed with RN TPN and supplies were delivered to patient. TOC signing off.    Barriers to Discharge: Barriers Resolved  Patient Goals and CMS Choice Patient states their goals for this hospitalization and ongoing recovery are:: return home  Readmission Risk Interventions No flowsheet data found.

## 2021-03-02 NOTE — Discharge Summary (Signed)
Physician Discharge Summary  Lisa Donovan:[id number] DOB: January 21, 2001 DOA: 02/15/2021  PCP: Inda Coke, PA  Admit date: 02/15/2021 Discharge date: 03/02/2021  Admitted From: Home Disposition: Home with [place]  Recommendations for Outpatient Follow-up:  Follow up with PCP in 1-2 weeks Follow up with Gastroenterology within 1-2 weeks Follow up with General Surgery within 1-2 weeks  Please obtain CMP/CBC, Mag, Phos in one week Please follow up on the following pending results:  Home Health: YES  Equipment/Devices: TPN    Discharge Condition: Stable  CODE STATUS: FULL CODE  Diet recommendation: Regular Diet   Brief/Interim Summary: The patient is a 20 year old female with a past medical history significant for but not limited to [name], 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  She is noted have gone under anesthesia for excision of desmoid tumors on 02/14/2021 and following the procedure had not had a bowel movement or flatus which was unusual for the patient as she noticed to have 5-6 bowel movements a day typically after eating.  She presented with worsening abdominal pain, nausea, vomiting and was noted to be dehydrated.  CT scan of the abdomen pelvis was done which was concerning for small bowel obstruction versus ileus.  Patient was seen in the ED and attempts were made to transfer the patient to [facility] where she sees her general surgeon and gastroenterologist but due to lack of bed availability she is subsequently admitted to [facility] and general surgery was consulted for further admission.  She was placed on bowel rest, IV fluids and pain management as well as supportive care.   She continues to have some pain and cramping postprandially.  She was placed on empiric antibiotics with Cipro Flagyl to cover pouchitis and now completed IV antibiotics and then transition to oral antibiotics for another 7 days.  GI was also consulted and she is now TPN for her moderate malnutrition that she will be continued as an outpatient.  She will need to follow-up with Dr. Alisia Ferrari at the [place] clinic.  She is medically stable to be discharged home today and will continue TPN at home with close follow-up with her primary surgeon and gastroenterologist at [facility].  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 oxycodone and IV Dilaudid as needed for pain medication -General surgery was consulted and now signed off.  She is status post exam under anesthesia on 02/21/2021 and per general surgery recommendation was to continue Cipro Flagyl empirically to cover proctitis -Dietitian has been consulted given her poor p.o. intake and now is on TPN along with a regular diet -GI has been consulted and they  recommended continuing IV antibiotics until Thursday, 06/27/2020 and then changed to oral antibiotics of Cipro Flagyl to continue for another 7 days -She remains on TPN that will need to be continued as an outpatient and she needs close follow-up with  Dr. Alisia Ferrari at [place] [facility] -03/01/2021 she improved and was prepped to go home however awaiting the delivery of TPN equipment and is not able to be done given the power outage yesterday and now equipment has been delivered and she is stable to be discharged and follow-up  [name] -Continue outpatient follow-up with her specialist at [facility]  Hypokalemia -Improved and resolved -Patient potassium is now 3.8 -Continue to monitor and replete as necessary and follow-up in the outpatient setting  Nonsevere moderate malnutrition in the context of chronic illness -Nutritionist evaluated and recommending continue Ensure Enlive 3 times daily as well as encouraging p.o. as tolerated. -TPN was initiated and will continue at discharge; will need to continue monitor potassium and phosphorus and magnesium closely given that she is at risk for refeeding syndrome due to her moderate malnutrition and inadequate p.o. intake for over greater than 2 weeks  Hyperglycemia/hypoglycemia -Patient has had labile CBGs -She was ordered for a home glucometer -CBGs elevated in the setting of TPN p.o. intake -CBGs ranging from 99-139 -Hemoglobin A1c not checked this hospitalization but can be checked in outpatient setting  Normocytic anemia -Patient's hemoglobin/hematocrit went from 11.8/37.8 and now 12.1/38.7 -Check anemia panel outpatient -Continue to monitor for signs and symptoms bleeding; currently no overt bleeding noted -Repeat CBC in outpatient setting  Discharge Instructions Discharge Instructions     Call MD for:  difficulty breathing, headache or visual disturbances   Complete by: As directed    Call MD for:  extreme fatigue   Complete by: As directed    Call MD for:  hives   Complete by: As directed    Call MD for:  persistant dizziness or light-headedness   Complete by: As directed    Call MD for:  persistant nausea and vomiting   Complete by: As directed    Call MD for:  redness, tenderness, or  signs of infection (pain, swelling, redness, odor or green/yellow discharge around incision site)   Complete by: As directed    Call MD for:  severe uncontrolled pain   Complete by: As directed    Call MD for:  temperature >100.4   Complete by: As directed    Diet - low sodium heart healthy   Complete by: As directed    Discharge instructions   Complete by: As directed    You were cared for by a hospitalist during your hospital stay. If you have any questions about your discharge medications or the care you received while you were in the hospital after you are discharged, you can call the unit and ask to speak with the hospitalist on call if the hospitalist that took care of you is not available. Once you are discharged, your primary care physician will handle any further medical issues. Please note that NO REFILLS for any discharge medications will be authorized once you are discharged, as it is imperative that you return to your primary care physician (or establish a relationship with a primary care physician if you do not have one) for your aftercare needs so that they can reassess your need for medications and monitor your lab values.  Follow up with PCP, Gastroenterology, and General Surgery within 1-2 weeks. Take all medications as prescribed. If symptoms change  or worsen please return to the ED for evaluation   Increase activity slowly   Complete by: As directed       Allergies as of 03/02/2021       Reactions   Compazine [prochlorperazine] Other (See Comments)   agitation   Papaya Hives   Morphine And Related Nausea And Vomiting   Zosyn [piperacillin-tazobactam In Dex] Itching, Dermatitis, Rash   Latex Rash   Neomycin Swelling, Rash   Tape Rash   Prefers paper tapes         Medication List     STOP taking these medications    benzonatate 100 MG capsule Commonly known as: TESSALON   chlorpheniramine-HYDROcodone 10-8 MG/5ML Suer Commonly known as: Tussionex  Pennkinetic ER   desipramine 25 MG tablet Commonly known as: NORPRAMIN   omeprazole 40 MG capsule Commonly known as: PRILOSEC       TAKE these medications    acetaminophen 500 MG tablet Commonly known as: TYLENOL Take 500 mg by mouth every 6 (six) hours as needed for moderate pain.   blood glucose meter kit and supplies Kit Dispense based on patient and insurance preference. Use up to four times daily as directed.   ciprofloxacin 500 MG tablet Commonly known as: CIPRO Take 1 tablet (500 mg total) by mouth 2 (two) times daily for 6 days. What changed: when to take this   dicyclomine 10 MG capsule Commonly known as: BENTYL Take 1 capsule (10 mg total) by mouth 4 (four) times daily -  before meals and at bedtime.   feeding supplement Liqd Take 237 mLs by mouth 3 (three) times daily with meals.   hydrocortisone 2.5 % rectal cream Commonly known as: ANUSOL-HC Place rectally 3 (three) times daily.   hyoscyamine 0.125 MG SL tablet Commonly known as: LEVSIN SL Take 0.125 mg by mouth every 6 (six) hours as needed for cramping.   loratadine 10 MG tablet Commonly known as: CLARITIN Take 10 mg by mouth daily as needed for allergies.   Menthol-Zinc Oxide 0.44-20.6 % Oint Apply 1 application topically daily as needed (burning).   metroNIDAZOLE 500 MG tablet Commonly known as: FLAGYL Take 1 tablet (500 mg total) by mouth every 12 (twelve) hours for 6 days.   mirtazapine 7.5 MG tablet Commonly known as: REMERON Take 7.5 mg by mouth at bedtime.   multivitamin with minerals Tabs tablet Take 1 tablet by mouth daily. Start taking on: March 03, 2021   ondansetron 4 MG disintegrating tablet Commonly known as: ZOFRAN-ODT Take 1 tablet (4 mg total) by mouth every 8 (eight) hours as needed for nausea or vomiting.   oxyCODONE 5 MG immediate release tablet Commonly known as: Oxy IR/ROXICODONE Take 1 tablet (5 mg total) by mouth every 6 (six) hours as needed for up to 3 days  for moderate pain or severe pain.   pantoprazole 40 MG tablet Commonly known as: PROTONIX Take 1 tablet (40 mg total) by mouth 2 (two) times daily.   saccharomyces boulardii 250 MG capsule Commonly known as: FLORASTOR Take 1 capsule (250 mg total) by mouth 2 (two) times daily.   sodium chloride flush 0.9 % Soln Commonly known as: NS 10-40 mLs by Intracatheter route every 12 (twelve) hours.   traMADol 50 MG tablet Commonly known as: ULTRAM Take 50 mg by mouth 3 (three) times daily as needed for moderate pain.               Durable Medical Equipment  (From admission, onward)  Start     Ordered   02/28/21 1313  DME Glucometer  Once        02/28/21 1312            Allergies  Allergen Reactions   Compazine [Prochlorperazine] Other (See Comments)    agitation   Papaya Hives   Morphine And Related Nausea And Vomiting   Zosyn [Piperacillin-Tazobactam In Dex] Itching, Dermatitis and Rash   Latex Rash   Neomycin Swelling and Rash   Tape Rash    Prefers paper tapes    Consultations: Gastroenterology General Surgery  Procedures/Studies: CT ABDOMEN PELVIS W CONTRAST  Result Date: 02/22/2021 CLINICAL DATA:  Abdominal pain EXAM: CT ABDOMEN AND PELVIS WITH CONTRAST TECHNIQUE: Multidetector CT imaging of the abdomen and pelvis was performed using the standard protocol following bolus administration of intravenous contrast. CONTRAST:  16m OMNIPAQUE IOHEXOL 350 MG/ML SOLN COMPARISON:  02/15/2021 FINDINGS: Lower chest: Unremarkable. Hepatobiliary: There is 6 mm low-attenuation in the right lobe of liver which has not changed, possibly a cyst. There is no dilation of bile ducts. Gallbladder is not distended. Pancreas: No focal abnormality is seen. Spleen: Unremarkable. Adrenals/Urinary Tract: Adrenals are not enlarged. There is no hydronephrosis. There is 10 mm low-density structure in the midportion of left kidney with density measurements higher than usual for  simple cyst. Higher than usual density measurement could easily be normal variation due to intravenous contrast and dense enhancement of adjacent renal cortex. Follow-up renal sonogram in outpatient setting may be considered. There is contrast in the pelvocaliceal systems limiting evaluation for small stones. In the previous study, no definite renal stone was observed. There are no calcific densities in the courses of the ureters. Urinary bladder is unremarkable. Stomach/Bowel: Stomach is not distended. Small bowel loops are not dilated. There is evidence of subtotal colectomy with ileosigmoid anastomosis. Oral contrast has reached rectosigmoid. Dilation of few small bowel loops seen in the lower abdomen in the previous study could not be identified in the current study. Vascular/Lymphatic: Unremarkable. Reproductive: Uterus is to the left of midline. There are no dominant adnexal masses. Small amount of free fluid is seen in the pelvis. Other: There is no ascites or pneumoperitoneum. Musculoskeletal: Unremarkable. IMPRESSION: There is no evidence of intestinal obstruction or pneumoperitoneum. There is no hydronephrosis. Previous subtotal colectomy. Dilation of distal ileum seen in the previous examination could not be identified in the current study. Oral contrast has reached the rectum. Possible small hepatic cyst. There is 10 mm smooth marginated low-density lesion in the left kidney with density measurements higher than usual for simple cyst. Follow-up renal sonogram in the outpatient setting should be considered to re-evaluate this finding. Electronically Signed   By: PElmer PickerM.D.   On: 02/22/2021 18:31   CT ABDOMEN PELVIS W CONTRAST  Result Date: 02/15/2021 CLINICAL DATA:  Abdominal pain, nausea, vomiting EXAM: CT ABDOMEN AND PELVIS WITH CONTRAST TECHNIQUE: Multidetector CT imaging of the abdomen and pelvis was performed using the standard protocol following bolus administration of intravenous  contrast. CONTRAST:  880mOMNIPAQUE IOHEXOL 300 MG/ML  SOLN COMPARISON:  None. FINDINGS: Lower chest: No acute abnormality. Hepatobiliary: Scattered subcentimeter cysts. No suspicious focal hepatic abnormality. Focal fatty infiltration adjacent to the falciform ligament. Gallbladder unremarkable. Pancreas: No focal abnormality or ductal dilatation. Spleen: No focal abnormality.  Normal size. Adrenals/Urinary Tract: 9 mm cyst in the midpole of the left kidney. No hydronephrosis. Adrenal glands and urinary bladder unremarkable. Stomach/Bowel: Prior colectomy. Mid to distal small bowel loops are mildly  dilated and fluid-filled with scattered air-fluid levels. Proximal small bowel and stomach decompressed. Is difficult to exclude partial small bowel obstruction. Vascular/Lymphatic: No evidence of aneurysm or adenopathy. Reproductive: Uterus and adnexa unremarkable.  No mass. Other: Small amount of free fluid in the pelvis and adjacent to the liver. No free air. Musculoskeletal: No acute bony abnormality. IMPRESSION: Prior colectomy. Mid to distal small bowel loops are prominent with air-fluid levels. Difficult to completely exclude distal partial small bowel obstruction. Small amount of free fluid in the abdomen and pelvis. Electronically Signed   By: Rolm Baptise M.D.   On: 02/15/2021 23:39   [name] EKG Site Rite  Result Date: 02/22/2021 If Site Rite image not attached, placement could not be confirmed due to current cardiac rhythm.  [name] EKG SITE RITE  Result Date: 02/22/2021 If Site Rite image not attached, placement could not be confirmed due to current cardiac rhythm.    Subjective: Seen and Examined at bedside and doing fairly well. No CP or SOB. Has some abdominal bloating and slight discomfort but manageable with the oral meds. Feels overall improved. No other concerns or complaints at this time and TPN delivered so stable to D/C Home today with outpatient close follow up with her specialists.    Discharge Exam: Vitals:   03/01/21 2159 03/02/21 0511  BP: 126/76 (!) 102/56  Pulse: 96 69  Resp: 18 16  Temp: 97.7 F (36.5 C) 97.8 F (36.6 C)  SpO2: 99% 99%   Vitals:   03/01/21 1514 03/01/21 1740 03/01/21 2159 03/02/21 0511  BP: 117/64  126/76 (!) 102/56  Pulse: 79  96 69  Resp: 14  18 16   Temp:  98.3 F (36.8 C) 97.7 F (36.5 C) 97.8 F (36.6 C)  TempSrc:  Oral Oral Oral  SpO2: 99%  99% 99%  Weight:      Height:       General: Pt is alert, awake, not in acute distress Cardiovascular: RRR, S1/S2 +, no rubs, no gallops Respiratory: CTA bilaterally, no wheezing, no rhonchi Abdominal: Soft, mildly tender, A little distended, bowel sounds + Extremities: no edema, no cyanosis  The results of significant diagnostics from this hospitalization (including imaging, microbiology, ancillary and laboratory) are listed below for reference.    Microbiology: No results found for this or any previous visit (from the past 240 hour(s)).   Labs: BNP (last 3 results) No results for input(s): BNP in the last 8760 hours. Basic Metabolic Panel: Recent Labs  Lab 02/26/21 1202 02/27/21 0432 02/28/21 0425 03/01/21 0427 03/02/21 0403  NA 137 137 138 140 140  K 3.5 3.7 3.6 3.8 3.8  CL 106 106 108 109 108  CO2 26 26 25 26 25   GLUCOSE 133* 125* 134* 125* 108*  BUN 10 12 13 15 12   CREATININE 0.59 0.50 0.45 0.37* 0.45  CALCIUM 8.7* 8.6* 8.7* 8.7* 8.8*  MG 2.3 2.3 2.2 2.2 2.3  PHOS 3.8 3.7 2.9 3.1 3.2   Liver Function Tests: Recent Labs  Lab 02/24/21 0457 02/25/21 0401 02/28/21 0425 03/01/21 0427 03/02/21 0403  AST 10* 12* 14* 15 15  ALT 13 14 12 13 14   ALKPHOS 40 42 48 41 39  BILITOT 0.5 0.6 0.3 0.3 0.4  PROT 5.9* 6.2* 6.3* 6.0* 6.2*  ALBUMIN 3.4* 3.7 3.7 3.7 3.6   No results for input(s): LIPASE, AMYLASE in the last 168 hours. No results for input(s): AMMONIA in the last 168 hours. CBC: Recent Labs  Lab 02/24/21 0457 02/25/21 0401  02/28/21 0425 03/01/21 0427  03/02/21 0403  WBC 7.9 8.2 9.0 9.1 9.4  NEUTROABS  --   --  5.7 5.8 6.0  HGB 12.1 12.9 12.4 11.8* 12.1  HCT 38.6 40.8 39.0 37.8 38.7  MCV 84.8 84.0 84.6 84.9 85.6  PLT 231 233 222 216 196   Cardiac Enzymes: No results for input(s): CKTOTAL, CKMB, CKMBINDEX, TROPONINI in the last 168 hours. BNP: Invalid input(s): POCBNP CBG: Recent Labs  Lab 03/01/21 0505 03/01/21 1143 03/01/21 1844 03/01/21 2337 03/02/21 0512  GLUCAP 137* 118* 124* 120* 138*   D-Dimer No results for input(s): DDIMER in the last 72 hours. Hgb A1c No results for input(s): HGBA1C in the last 72 hours. Lipid Profile No results for input(s): CHOL, HDL, LDLCALC, TRIG, CHOLHDL, LDLDIRECT in the last 72 hours. Thyroid function studies No results for input(s): TSH, T4TOTAL, T3FREE, THYROIDAB in the last 72 hours.  Invalid input(s): FREET3 Anemia work up No results for input(s): VITAMINB12, FOLATE, FERRITIN, TIBC, IRON, RETICCTPCT in the last 72 hours. Urinalysis    Component Value Date/Time   COLORURINE YELLOW 02/15/2021 2210   APPEARANCEUR CLEAR 02/15/2021 2210   LABSPEC 1.011 02/15/2021 2210   PHURINE 7.5 02/15/2021 2210   GLUCOSEU NEGATIVE 02/15/2021 2210   HGBUR NEGATIVE 02/15/2021 2210   BILIRUBINUR NEGATIVE 02/15/2021 2210   BILIRUBINUR Negative 11/09/2019 1323   KETONESUR NEGATIVE 02/15/2021 2210   PROTEINUR NEGATIVE 02/15/2021 2210   UROBILINOGEN 0.2 11/09/2019 1323   NITRITE NEGATIVE 02/15/2021 2210   LEUKOCYTESUR NEGATIVE 02/15/2021 2210   Sepsis Labs Invalid input(s): PROCALCITONIN,  WBC,  LACTICIDVEN Microbiology No results found for this or any previous visit (from the past 240 hour(s)).  Time coordinating discharge: 35 minutes  SIGNED:  Kerney Elbe, DO [facility] 03/02/2021, 10:02 AM Pager is on [place]  If 7PM-7AM, please contact night-coverage [email or web address]

## 2021-03-02 NOTE — Progress Notes (Signed)
Patient and family member was given discharge instructions, and all questions were answered. Patient was stable for discharge and was taken to the main exit by wheelchair. 

## 2021-03-02 NOTE — Progress Notes (Signed)
PHARMACY - TOTAL PARENTERAL NUTRITION CONSULT NOTE   Indication:  Intolerance to Enteral Feeding  Patient Measurements: Height: 5\' 5"  (165.1 cm) Weight: 51 kg (112 lb 6.4 oz) IBW/kg (Calculated) : 57 TPN AdjBW (KG): 53.1 Body mass index is 18.7 kg/m.  Assessment: 73 yoF with partial small bowel obstruction on 12/9.  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  She is s/p CCS exam on 12/15 with no significant findings or surgical indications.   She currently c/o pain with BMs and bloating.  Limited PO intake.  RD recommend initiation of TPN given suspected continued severe malnutrition, and poor PO since admission (12/9 -9 days ago).   Glucose / Insulin: No hx DM.  CBGs [id number].  Patient has been refusing SSI Electrolytes: Lytes WNL, CorrCa near normal at 8.9.  Renal: SCr, BUN WNL Hepatic: LFTs and Tbili WNL.  Albumin and Prealbumin WNL.  TG 64 Intake / Output; MIVF: UOP 1950 mL, stool x1 Intake: NS at 45 ml/hr GI Imaging:  12/16 CT: no evidence of intestinal obstruction or pneumoperitoneum.No hydronephrosis.  Previous subtotal colectomy. Oral contrast has reached the rectum.  Central access: PICC (12/16) TPN start date: 12/19  Nutritional Goals: Goal TPN rate is 80 mL/hr (provides 96 g of protein and 1843 kcals per day)  RD Assessment: Estimated Needs Total Energy Estimated Needs: 1650-1850 Total Protein Estimated Needs: 85-100g Total Fluid Estimated Needs: 1.9L/day  Current Nutrition:  Regular diet - thin   Ensure TID TPN to goal rate on 12/21  Plan:  - Planning discharge home on 12/24 - labs stable, continue same formula.  - Discharge home on 12/23 delayted dt/ no power.   - [facility] to provide TPN.  Home health orders for [facility] RN    Gretta Arab PharmD, BCPS Clinical  Pharmacist [facility] main pharmacy [phone] 03/02/2021 9:49 AM

## 2021-03-03 DIAGNOSIS — Z932 Ileostomy status: Secondary | ICD-10-CM | POA: Diagnosis not present

## 2021-03-03 DIAGNOSIS — E860 Dehydration: Secondary | ICD-10-CM | POA: Diagnosis not present

## 2021-03-03 DIAGNOSIS — R112 Nausea with vomiting, unspecified: Secondary | ICD-10-CM | POA: Diagnosis not present

## 2021-03-03 DIAGNOSIS — R1084 Generalized abdominal pain: Secondary | ICD-10-CM | POA: Diagnosis not present

## 2021-03-04 DIAGNOSIS — R1084 Generalized abdominal pain: Secondary | ICD-10-CM | POA: Diagnosis not present

## 2021-03-04 DIAGNOSIS — Z932 Ileostomy status: Secondary | ICD-10-CM | POA: Diagnosis not present

## 2021-03-04 DIAGNOSIS — E860 Dehydration: Secondary | ICD-10-CM | POA: Diagnosis not present

## 2021-03-04 DIAGNOSIS — R112 Nausea with vomiting, unspecified: Secondary | ICD-10-CM | POA: Diagnosis not present

## 2021-03-05 DIAGNOSIS — R112 Nausea with vomiting, unspecified: Secondary | ICD-10-CM | POA: Diagnosis not present

## 2021-03-05 DIAGNOSIS — E860 Dehydration: Secondary | ICD-10-CM | POA: Diagnosis not present

## 2021-03-05 DIAGNOSIS — Z932 Ileostomy status: Secondary | ICD-10-CM | POA: Diagnosis not present

## 2021-03-05 DIAGNOSIS — R1084 Generalized abdominal pain: Secondary | ICD-10-CM | POA: Diagnosis not present

## 2021-03-06 DIAGNOSIS — R1084 Generalized abdominal pain: Secondary | ICD-10-CM | POA: Diagnosis not present

## 2021-03-06 DIAGNOSIS — E860 Dehydration: Secondary | ICD-10-CM | POA: Diagnosis not present

## 2021-03-06 DIAGNOSIS — R112 Nausea with vomiting, unspecified: Secondary | ICD-10-CM | POA: Diagnosis not present

## 2021-03-06 DIAGNOSIS — Z932 Ileostomy status: Secondary | ICD-10-CM | POA: Diagnosis not present

## 2021-03-07 DIAGNOSIS — R1084 Generalized abdominal pain: Secondary | ICD-10-CM | POA: Diagnosis not present

## 2021-03-07 DIAGNOSIS — R112 Nausea with vomiting, unspecified: Secondary | ICD-10-CM | POA: Diagnosis not present

## 2021-03-07 DIAGNOSIS — Z932 Ileostomy status: Secondary | ICD-10-CM | POA: Diagnosis not present

## 2021-03-07 DIAGNOSIS — E860 Dehydration: Secondary | ICD-10-CM | POA: Diagnosis not present

## 2021-03-08 DIAGNOSIS — E860 Dehydration: Secondary | ICD-10-CM | POA: Diagnosis not present

## 2021-03-08 DIAGNOSIS — R112 Nausea with vomiting, unspecified: Secondary | ICD-10-CM | POA: Diagnosis not present

## 2021-03-08 DIAGNOSIS — Z932 Ileostomy status: Secondary | ICD-10-CM | POA: Diagnosis not present

## 2021-03-08 DIAGNOSIS — R1084 Generalized abdominal pain: Secondary | ICD-10-CM | POA: Diagnosis not present

## 2021-03-09 DIAGNOSIS — Z932 Ileostomy status: Secondary | ICD-10-CM | POA: Diagnosis not present

## 2021-03-09 DIAGNOSIS — E860 Dehydration: Secondary | ICD-10-CM | POA: Diagnosis not present

## 2021-03-09 DIAGNOSIS — R112 Nausea with vomiting, unspecified: Secondary | ICD-10-CM | POA: Diagnosis not present

## 2021-03-09 DIAGNOSIS — R1084 Generalized abdominal pain: Secondary | ICD-10-CM | POA: Diagnosis not present

## 2021-03-10 DIAGNOSIS — R112 Nausea with vomiting, unspecified: Secondary | ICD-10-CM | POA: Diagnosis not present

## 2021-03-10 DIAGNOSIS — R1084 Generalized abdominal pain: Secondary | ICD-10-CM | POA: Diagnosis not present

## 2021-03-10 DIAGNOSIS — E860 Dehydration: Secondary | ICD-10-CM | POA: Diagnosis not present

## 2021-03-10 DIAGNOSIS — Z932 Ileostomy status: Secondary | ICD-10-CM | POA: Diagnosis not present

## 2021-03-11 DIAGNOSIS — R112 Nausea with vomiting, unspecified: Secondary | ICD-10-CM | POA: Diagnosis not present

## 2021-03-11 DIAGNOSIS — Z932 Ileostomy status: Secondary | ICD-10-CM | POA: Diagnosis not present

## 2021-03-11 DIAGNOSIS — E860 Dehydration: Secondary | ICD-10-CM | POA: Diagnosis not present

## 2021-03-11 DIAGNOSIS — R1084 Generalized abdominal pain: Secondary | ICD-10-CM | POA: Diagnosis not present

## 2021-03-12 DIAGNOSIS — Z932 Ileostomy status: Secondary | ICD-10-CM | POA: Diagnosis not present

## 2021-03-12 DIAGNOSIS — R112 Nausea with vomiting, unspecified: Secondary | ICD-10-CM | POA: Diagnosis not present

## 2021-03-12 DIAGNOSIS — E860 Dehydration: Secondary | ICD-10-CM | POA: Diagnosis not present

## 2021-03-12 DIAGNOSIS — R1084 Generalized abdominal pain: Secondary | ICD-10-CM | POA: Diagnosis not present

## 2021-03-13 ENCOUNTER — Ambulatory Visit
Admit: 2021-03-13 | Discharge: 2021-03-14 | Payer: PRIVATE HEALTH INSURANCE | Attending: Gastroenterology | Primary: Gastroenterology

## 2021-03-13 ENCOUNTER — Ambulatory Visit: Admit: 2021-03-13 | Discharge: 2021-03-14 | Payer: PRIVATE HEALTH INSURANCE

## 2021-03-13 DIAGNOSIS — K91850 Pouchitis: Principal | ICD-10-CM

## 2021-03-13 DIAGNOSIS — K635 Polyp of colon: Principal | ICD-10-CM

## 2021-03-13 DIAGNOSIS — Q8789 Other specified congenital malformation syndromes, not elsewhere classified: Principal | ICD-10-CM

## 2021-03-13 DIAGNOSIS — Z789 Other specified health status: Principal | ICD-10-CM

## 2021-03-13 DIAGNOSIS — Z932 Ileostomy status: Secondary | ICD-10-CM | POA: Diagnosis not present

## 2021-03-13 DIAGNOSIS — R1084 Generalized abdominal pain: Secondary | ICD-10-CM | POA: Diagnosis not present

## 2021-03-13 DIAGNOSIS — D126 Benign neoplasm of colon, unspecified: Secondary | ICD-10-CM | POA: Diagnosis not present

## 2021-03-13 DIAGNOSIS — Z6820 Body mass index (BMI) 20.0-20.9, adult: Secondary | ICD-10-CM | POA: Diagnosis not present

## 2021-03-13 DIAGNOSIS — R109 Unspecified abdominal pain: Secondary | ICD-10-CM | POA: Diagnosis not present

## 2021-03-13 DIAGNOSIS — R112 Nausea with vomiting, unspecified: Secondary | ICD-10-CM | POA: Diagnosis not present

## 2021-03-13 DIAGNOSIS — E860 Dehydration: Secondary | ICD-10-CM | POA: Diagnosis not present

## 2021-03-13 DIAGNOSIS — G8929 Other chronic pain: Secondary | ICD-10-CM | POA: Diagnosis not present

## 2021-03-13 MED ORDER — MIRTAZAPINE 15 MG TABLET
ORAL_TABLET | Freq: Every evening | ORAL | 2 refills | 60 days | Status: CP
Start: 2021-03-13 — End: 2021-05-12

## 2021-03-13 MED ORDER — CEFDINIR 300 MG CAPSULE
ORAL_CAPSULE | Freq: Two times a day (BID) | ORAL | 0 refills | 14 days | Status: CP
Start: 2021-03-13 — End: 2021-03-27

## 2021-03-13 MED ORDER — OMEPRAZOLE 40 MG CAPSULE,DELAYED RELEASE
ORAL_CAPSULE | Freq: Two times a day (BID) | ORAL | 11 refills | 30 days | Status: CP
Start: 2021-03-13 — End: 2022-03-13

## 2021-03-14 ENCOUNTER — Ambulatory Visit: Admit: 2021-03-14 | Discharge: 2021-03-15 | Payer: PRIVATE HEALTH INSURANCE

## 2021-03-14 DIAGNOSIS — Z452 Encounter for adjustment and management of vascular access device: Secondary | ICD-10-CM | POA: Diagnosis not present

## 2021-03-14 DIAGNOSIS — K567 Ileus, unspecified: Secondary | ICD-10-CM | POA: Diagnosis not present

## 2021-03-14 DIAGNOSIS — Z885 Allergy status to narcotic agent status: Secondary | ICD-10-CM | POA: Diagnosis not present

## 2021-03-14 DIAGNOSIS — Z8616 Personal history of COVID-19: Secondary | ICD-10-CM | POA: Diagnosis not present

## 2021-03-14 DIAGNOSIS — Z789 Other specified health status: Secondary | ICD-10-CM | POA: Diagnosis not present

## 2021-03-14 DIAGNOSIS — K635 Polyp of colon: Secondary | ICD-10-CM | POA: Diagnosis not present

## 2021-03-14 DIAGNOSIS — Q8789 Other specified congenital malformation syndromes, not elsewhere classified: Secondary | ICD-10-CM | POA: Diagnosis not present

## 2021-03-14 MED ORDER — OXYCODONE 5 MG TABLET
ORAL_TABLET | Freq: Four times a day (QID) | ORAL | 0 refills | 2.00000 days | Status: CP | PRN
Start: 2021-03-14 — End: ?

## 2021-03-15 ENCOUNTER — Ambulatory Visit: Admit: 2021-03-15 | Discharge: 2021-03-16 | Payer: PRIVATE HEALTH INSURANCE

## 2021-03-15 DIAGNOSIS — Q8789 Other specified congenital malformation syndromes, not elsewhere classified: Secondary | ICD-10-CM | POA: Diagnosis not present

## 2021-03-15 DIAGNOSIS — R1084 Generalized abdominal pain: Secondary | ICD-10-CM | POA: Diagnosis not present

## 2021-03-15 DIAGNOSIS — D481 Neoplasm of uncertain behavior of connective and other soft tissue: Secondary | ICD-10-CM | POA: Diagnosis not present

## 2021-03-15 DIAGNOSIS — R112 Nausea with vomiting, unspecified: Secondary | ICD-10-CM | POA: Diagnosis not present

## 2021-03-15 DIAGNOSIS — E860 Dehydration: Secondary | ICD-10-CM | POA: Diagnosis not present

## 2021-03-15 DIAGNOSIS — Z932 Ileostomy status: Secondary | ICD-10-CM | POA: Diagnosis not present

## 2021-03-15 DIAGNOSIS — R5381 Other malaise: Secondary | ICD-10-CM | POA: Diagnosis not present

## 2021-03-15 DIAGNOSIS — Z86010 Personal history of colonic polyps: Secondary | ICD-10-CM | POA: Diagnosis not present

## 2021-03-16 DIAGNOSIS — Z932 Ileostomy status: Secondary | ICD-10-CM | POA: Diagnosis not present

## 2021-03-16 DIAGNOSIS — R1084 Generalized abdominal pain: Secondary | ICD-10-CM | POA: Diagnosis not present

## 2021-03-16 DIAGNOSIS — E860 Dehydration: Secondary | ICD-10-CM | POA: Diagnosis not present

## 2021-03-16 DIAGNOSIS — R112 Nausea with vomiting, unspecified: Secondary | ICD-10-CM | POA: Diagnosis not present

## 2021-03-17 DIAGNOSIS — E860 Dehydration: Secondary | ICD-10-CM | POA: Diagnosis not present

## 2021-03-17 DIAGNOSIS — R112 Nausea with vomiting, unspecified: Secondary | ICD-10-CM | POA: Diagnosis not present

## 2021-03-17 DIAGNOSIS — Z932 Ileostomy status: Secondary | ICD-10-CM | POA: Diagnosis not present

## 2021-03-17 DIAGNOSIS — R1084 Generalized abdominal pain: Secondary | ICD-10-CM | POA: Diagnosis not present

## 2021-03-18 DIAGNOSIS — Z932 Ileostomy status: Secondary | ICD-10-CM | POA: Diagnosis not present

## 2021-03-18 DIAGNOSIS — R1084 Generalized abdominal pain: Secondary | ICD-10-CM | POA: Diagnosis not present

## 2021-03-18 DIAGNOSIS — R112 Nausea with vomiting, unspecified: Secondary | ICD-10-CM | POA: Diagnosis not present

## 2021-03-18 DIAGNOSIS — E860 Dehydration: Secondary | ICD-10-CM | POA: Diagnosis not present

## 2021-03-19 ENCOUNTER — Ambulatory Visit: Admit: 2021-03-19 | Discharge: 2021-03-20 | Payer: PRIVATE HEALTH INSURANCE

## 2021-03-19 ENCOUNTER — Other Ambulatory Visit: Admit: 2021-03-19 | Discharge: 2021-03-20 | Payer: PRIVATE HEALTH INSURANCE

## 2021-03-19 DIAGNOSIS — D235 Other benign neoplasm of skin of trunk: Principal | ICD-10-CM

## 2021-03-19 DIAGNOSIS — D481 Neoplasm of uncertain behavior of connective and other soft tissue: Principal | ICD-10-CM

## 2021-03-19 DIAGNOSIS — I82611 Acute embolism and thrombosis of superficial veins of right upper extremity: Secondary | ICD-10-CM | POA: Diagnosis not present

## 2021-03-19 DIAGNOSIS — D500 Iron deficiency anemia secondary to blood loss (chronic): Secondary | ICD-10-CM | POA: Diagnosis not present

## 2021-03-19 DIAGNOSIS — R1084 Generalized abdominal pain: Secondary | ICD-10-CM | POA: Diagnosis not present

## 2021-03-19 DIAGNOSIS — E860 Dehydration: Secondary | ICD-10-CM | POA: Diagnosis not present

## 2021-03-19 DIAGNOSIS — Z8616 Personal history of COVID-19: Secondary | ICD-10-CM | POA: Diagnosis not present

## 2021-03-19 DIAGNOSIS — Z932 Ileostomy status: Secondary | ICD-10-CM | POA: Diagnosis not present

## 2021-03-19 DIAGNOSIS — Q8789 Other specified congenital malformation syndromes, not elsewhere classified: Secondary | ICD-10-CM | POA: Diagnosis not present

## 2021-03-19 DIAGNOSIS — K635 Polyp of colon: Secondary | ICD-10-CM | POA: Diagnosis not present

## 2021-03-19 DIAGNOSIS — I871 Compression of vein: Secondary | ICD-10-CM | POA: Diagnosis not present

## 2021-03-19 DIAGNOSIS — F419 Anxiety disorder, unspecified: Secondary | ICD-10-CM | POA: Diagnosis not present

## 2021-03-19 DIAGNOSIS — R112 Nausea with vomiting, unspecified: Secondary | ICD-10-CM | POA: Diagnosis not present

## 2021-03-20 DIAGNOSIS — R1084 Generalized abdominal pain: Secondary | ICD-10-CM | POA: Diagnosis not present

## 2021-03-20 DIAGNOSIS — Z932 Ileostomy status: Secondary | ICD-10-CM | POA: Diagnosis not present

## 2021-03-20 DIAGNOSIS — E860 Dehydration: Secondary | ICD-10-CM | POA: Diagnosis not present

## 2021-03-20 DIAGNOSIS — R112 Nausea with vomiting, unspecified: Secondary | ICD-10-CM | POA: Diagnosis not present

## 2021-03-20 MED ORDER — APIXABAN 5 MG TABLET
ORAL_TABLET | Freq: Two times a day (BID) | ORAL | 2 refills | 30 days | Status: CP
Start: 2021-03-20 — End: 2021-04-19

## 2021-03-21 DIAGNOSIS — Z932 Ileostomy status: Secondary | ICD-10-CM | POA: Diagnosis not present

## 2021-03-21 DIAGNOSIS — R112 Nausea with vomiting, unspecified: Secondary | ICD-10-CM | POA: Diagnosis not present

## 2021-03-21 DIAGNOSIS — E860 Dehydration: Secondary | ICD-10-CM | POA: Diagnosis not present

## 2021-03-21 DIAGNOSIS — R1084 Generalized abdominal pain: Secondary | ICD-10-CM | POA: Diagnosis not present

## 2021-03-22 DIAGNOSIS — E860 Dehydration: Secondary | ICD-10-CM | POA: Diagnosis not present

## 2021-03-22 DIAGNOSIS — R112 Nausea with vomiting, unspecified: Secondary | ICD-10-CM | POA: Diagnosis not present

## 2021-03-22 DIAGNOSIS — Z932 Ileostomy status: Secondary | ICD-10-CM | POA: Diagnosis not present

## 2021-03-22 DIAGNOSIS — R1084 Generalized abdominal pain: Secondary | ICD-10-CM | POA: Diagnosis not present

## 2021-03-23 DIAGNOSIS — R1084 Generalized abdominal pain: Secondary | ICD-10-CM | POA: Diagnosis not present

## 2021-03-23 DIAGNOSIS — Z932 Ileostomy status: Secondary | ICD-10-CM | POA: Diagnosis not present

## 2021-03-23 DIAGNOSIS — E860 Dehydration: Secondary | ICD-10-CM | POA: Diagnosis not present

## 2021-03-23 DIAGNOSIS — R112 Nausea with vomiting, unspecified: Secondary | ICD-10-CM | POA: Diagnosis not present

## 2021-03-24 DIAGNOSIS — Z932 Ileostomy status: Secondary | ICD-10-CM | POA: Diagnosis not present

## 2021-03-24 DIAGNOSIS — R112 Nausea with vomiting, unspecified: Secondary | ICD-10-CM | POA: Diagnosis not present

## 2021-03-24 DIAGNOSIS — E860 Dehydration: Secondary | ICD-10-CM | POA: Diagnosis not present

## 2021-03-24 DIAGNOSIS — R1084 Generalized abdominal pain: Secondary | ICD-10-CM | POA: Diagnosis not present

## 2021-03-25 DIAGNOSIS — R112 Nausea with vomiting, unspecified: Secondary | ICD-10-CM | POA: Diagnosis not present

## 2021-03-25 DIAGNOSIS — E860 Dehydration: Secondary | ICD-10-CM | POA: Diagnosis not present

## 2021-03-25 DIAGNOSIS — R1084 Generalized abdominal pain: Secondary | ICD-10-CM | POA: Diagnosis not present

## 2021-03-25 DIAGNOSIS — Z932 Ileostomy status: Secondary | ICD-10-CM | POA: Diagnosis not present

## 2021-03-26 ENCOUNTER — Telehealth: Admit: 2021-03-26 | Discharge: 2021-03-27 | Payer: PRIVATE HEALTH INSURANCE

## 2021-03-26 DIAGNOSIS — R112 Nausea with vomiting, unspecified: Secondary | ICD-10-CM | POA: Diagnosis not present

## 2021-03-26 DIAGNOSIS — Z515 Encounter for palliative care: Secondary | ICD-10-CM | POA: Diagnosis not present

## 2021-03-26 DIAGNOSIS — R1084 Generalized abdominal pain: Secondary | ICD-10-CM | POA: Diagnosis not present

## 2021-03-26 DIAGNOSIS — E860 Dehydration: Secondary | ICD-10-CM | POA: Diagnosis not present

## 2021-03-26 DIAGNOSIS — Z932 Ileostomy status: Secondary | ICD-10-CM | POA: Diagnosis not present

## 2021-03-26 DIAGNOSIS — M79601 Pain in right arm: Secondary | ICD-10-CM | POA: Diagnosis not present

## 2021-03-26 MED ORDER — OXYCODONE 5 MG TABLET
ORAL_TABLET | Freq: Two times a day (BID) | ORAL | 0 refills | 15.00000 days | Status: CP | PRN
Start: 2021-03-26 — End: ?

## 2021-03-27 ENCOUNTER — Telehealth: Payer: Self-pay | Admitting: Physician Assistant

## 2021-03-27 ENCOUNTER — Ambulatory Visit: Payer: BC Managed Care – PPO | Admitting: Physician Assistant

## 2021-03-27 ENCOUNTER — Emergency Department
Admit: 2021-03-27 | Discharge: 2021-03-28 | Disposition: A | Discharge status: Home / Self Care | Payer: PRIVATE HEALTH INSURANCE | Attending: Student in an Organized Health Care Education/Training Program

## 2021-03-27 ENCOUNTER — Ambulatory Visit
Admit: 2021-03-27 | Discharge: 2021-03-28 | Disposition: A | Discharge status: Home / Self Care | Payer: PRIVATE HEALTH INSURANCE | Attending: Student in an Organized Health Care Education/Training Program

## 2021-03-27 DIAGNOSIS — M79604 Pain in right leg: Secondary | ICD-10-CM | POA: Diagnosis not present

## 2021-03-27 DIAGNOSIS — R220 Localized swelling, mass and lump, head: Secondary | ICD-10-CM | POA: Diagnosis not present

## 2021-03-27 DIAGNOSIS — M7989 Other specified soft tissue disorders: Secondary | ICD-10-CM | POA: Diagnosis not present

## 2021-03-27 DIAGNOSIS — R0602 Shortness of breath: Secondary | ICD-10-CM | POA: Diagnosis not present

## 2021-03-27 DIAGNOSIS — Z932 Ileostomy status: Secondary | ICD-10-CM | POA: Diagnosis not present

## 2021-03-27 DIAGNOSIS — Z885 Allergy status to narcotic agent status: Secondary | ICD-10-CM | POA: Diagnosis not present

## 2021-03-27 DIAGNOSIS — R55 Syncope and collapse: Secondary | ICD-10-CM | POA: Diagnosis not present

## 2021-03-27 DIAGNOSIS — Z9221 Personal history of antineoplastic chemotherapy: Secondary | ICD-10-CM | POA: Diagnosis not present

## 2021-03-27 DIAGNOSIS — Z825 Family history of asthma and other chronic lower respiratory diseases: Secondary | ICD-10-CM | POA: Diagnosis not present

## 2021-03-27 DIAGNOSIS — R1084 Generalized abdominal pain: Secondary | ICD-10-CM | POA: Diagnosis not present

## 2021-03-27 DIAGNOSIS — K219 Gastro-esophageal reflux disease without esophagitis: Secondary | ICD-10-CM | POA: Diagnosis not present

## 2021-03-27 DIAGNOSIS — E860 Dehydration: Secondary | ICD-10-CM | POA: Diagnosis not present

## 2021-03-27 DIAGNOSIS — R112 Nausea with vomiting, unspecified: Secondary | ICD-10-CM | POA: Diagnosis not present

## 2021-03-27 DIAGNOSIS — M79601 Pain in right arm: Secondary | ICD-10-CM | POA: Diagnosis not present

## 2021-03-27 DIAGNOSIS — E041 Nontoxic single thyroid nodule: Secondary | ICD-10-CM | POA: Diagnosis not present

## 2021-03-27 DIAGNOSIS — R079 Chest pain, unspecified: Secondary | ICD-10-CM | POA: Diagnosis not present

## 2021-03-27 DIAGNOSIS — Z7901 Long term (current) use of anticoagulants: Secondary | ICD-10-CM | POA: Diagnosis not present

## 2021-03-27 NOTE — Telephone Encounter (Signed)
See Triage note 

## 2021-03-27 NOTE — Telephone Encounter (Signed)
Pt's mother stated pt is currently in the ED.   Patient Name: Lisa Donovan Gender: Female DOB: 04/19/2000 Age: 21 Y 3 M 27 D Return Phone Number: [phone] (Primary), [phone] (Secondary) Address: City/ State/ Zip: [place]  [address] Client [place] [facility] [place] [facility] [place] [facility] [place] Contact Type Call Who Is Calling Patient / Member / Family / Caregiver Call Type Triage / Clinical Relationship To Patient Self Return Phone Number [phone] (Primary) Chief Complaint BREATHING - shortness of breath or sounds breathless Reason for Call Symptomatic / Request for [place] states she is having SOB. States she had a PIC line due to clot and now has a central line. States she is retaining water. States she is swelling on her stomach, location of line, armpit and face. States they did put her on a blood thinner due to swelling. Translation No Nurse Assessment Nurse: Ysidro Evert, RN, Levada Dy Date/Time (Eastern Time): 03/27/2021 11:31:15 AM Confirm and document reason for call. If symptomatic, describe symptoms. ---Caller states she is having shortness of breath that started last Saturday. No fever. Does the patient have any new or worsening symptoms? ---Yes Will a triage be completed? ---Yes Related visit to physician within the last 2 weeks? ---No Does the PT have any chronic conditions? (i.e. diabetes, asthma, this includes High risk factors for pregnancy, etc.) ---Yes List chronic conditions. ---gardners syndrome, fap Is the patient pregnant or possibly pregnant? (Ask all females between the ages of 61-55) ---No Is this a behavioral health or substance abuse call? ---No Guidelines Guideline Title Affirmed Question Affirmed Notes Nurse Date/Time (Eastern Time) Breathing Difficulty [1] MODERATE difficulty breathing (e.g., speaks in phrases, SOB even at [facility], [place] 03/27/2021 11:33:27 AM  Guidelines Guideline Title Affirmed Question Affirmed Notes Nurse Date/Time Eilene Ghazi Time) rest, pulse 100-120) AND [2] NEW-onset or WORSE than normal Disp. Time Eilene Ghazi Time) Disposition Final User 03/27/2021 11:30:15 AM Send to Urgent Queue Levonne Spiller 03/27/2021 11:37:21 AM Go to ED Now Yes Ysidro Evert, RN, Marin Shutter Disagree/Comply Comply Caller Understands Yes PreDisposition Did not know what to do Care Advice Given Per Guideline GO TO ED NOW: * You need to be seen in the Emergency Department. * Another adult should drive. CARE ADVICE given per Breathing Difficulty (Adult) guideline. CALL EMS 911 IF: * Call EMS if you become worse. Referrals GO TO FACILITY UNDECIDED

## 2021-03-28 DIAGNOSIS — R1084 Generalized abdominal pain: Secondary | ICD-10-CM | POA: Diagnosis not present

## 2021-03-28 DIAGNOSIS — M6281 Muscle weakness (generalized): Secondary | ICD-10-CM | POA: Diagnosis not present

## 2021-03-28 DIAGNOSIS — E860 Dehydration: Secondary | ICD-10-CM | POA: Diagnosis not present

## 2021-03-28 DIAGNOSIS — Z932 Ileostomy status: Secondary | ICD-10-CM | POA: Diagnosis not present

## 2021-03-28 DIAGNOSIS — R0602 Shortness of breath: Secondary | ICD-10-CM | POA: Diagnosis not present

## 2021-03-28 DIAGNOSIS — R112 Nausea with vomiting, unspecified: Secondary | ICD-10-CM | POA: Diagnosis not present

## 2021-03-29 ENCOUNTER — Ambulatory Visit: Admit: 2021-03-29 | Discharge: 2021-03-30 | Payer: PRIVATE HEALTH INSURANCE

## 2021-03-29 DIAGNOSIS — D500 Iron deficiency anemia secondary to blood loss (chronic): Principal | ICD-10-CM

## 2021-03-29 DIAGNOSIS — I82890 Acute embolism and thrombosis of other specified veins: Principal | ICD-10-CM

## 2021-03-29 DIAGNOSIS — I82A11 Acute embolism and thrombosis of right axillary vein: Principal | ICD-10-CM

## 2021-03-29 DIAGNOSIS — Z8616 Personal history of COVID-19: Secondary | ICD-10-CM | POA: Diagnosis not present

## 2021-03-29 DIAGNOSIS — Z9049 Acquired absence of other specified parts of digestive tract: Secondary | ICD-10-CM | POA: Diagnosis not present

## 2021-03-29 DIAGNOSIS — R1084 Generalized abdominal pain: Secondary | ICD-10-CM | POA: Diagnosis not present

## 2021-03-29 DIAGNOSIS — E860 Dehydration: Secondary | ICD-10-CM | POA: Diagnosis not present

## 2021-03-29 DIAGNOSIS — Z932 Ileostomy status: Secondary | ICD-10-CM | POA: Diagnosis not present

## 2021-03-29 DIAGNOSIS — Z7901 Long term (current) use of anticoagulants: Secondary | ICD-10-CM | POA: Diagnosis not present

## 2021-03-29 DIAGNOSIS — R112 Nausea with vomiting, unspecified: Secondary | ICD-10-CM | POA: Diagnosis not present

## 2021-03-29 DIAGNOSIS — Z6821 Body mass index (BMI) 21.0-21.9, adult: Secondary | ICD-10-CM | POA: Diagnosis not present

## 2021-03-29 MED ORDER — APIXABAN 2.5 MG TABLET
ORAL_TABLET | Freq: Two times a day (BID) | ORAL | 0 refills | 30 days | Status: CP
Start: 2021-03-29 — End: 2021-04-28

## 2021-03-30 DIAGNOSIS — E860 Dehydration: Secondary | ICD-10-CM | POA: Diagnosis not present

## 2021-03-30 DIAGNOSIS — R112 Nausea with vomiting, unspecified: Secondary | ICD-10-CM | POA: Diagnosis not present

## 2021-03-30 DIAGNOSIS — R1084 Generalized abdominal pain: Secondary | ICD-10-CM | POA: Diagnosis not present

## 2021-03-30 DIAGNOSIS — Z932 Ileostomy status: Secondary | ICD-10-CM | POA: Diagnosis not present

## 2021-03-31 DIAGNOSIS — R1084 Generalized abdominal pain: Secondary | ICD-10-CM | POA: Diagnosis not present

## 2021-03-31 DIAGNOSIS — R112 Nausea with vomiting, unspecified: Secondary | ICD-10-CM | POA: Diagnosis not present

## 2021-03-31 DIAGNOSIS — Z932 Ileostomy status: Secondary | ICD-10-CM | POA: Diagnosis not present

## 2021-03-31 DIAGNOSIS — E860 Dehydration: Secondary | ICD-10-CM | POA: Diagnosis not present

## 2021-04-01 DIAGNOSIS — R112 Nausea with vomiting, unspecified: Secondary | ICD-10-CM | POA: Diagnosis not present

## 2021-04-01 DIAGNOSIS — R1084 Generalized abdominal pain: Secondary | ICD-10-CM | POA: Diagnosis not present

## 2021-04-01 DIAGNOSIS — E860 Dehydration: Secondary | ICD-10-CM | POA: Diagnosis not present

## 2021-04-01 DIAGNOSIS — Z932 Ileostomy status: Secondary | ICD-10-CM | POA: Diagnosis not present

## 2021-04-02 DIAGNOSIS — Z932 Ileostomy status: Secondary | ICD-10-CM | POA: Diagnosis not present

## 2021-04-02 DIAGNOSIS — R1084 Generalized abdominal pain: Secondary | ICD-10-CM | POA: Diagnosis not present

## 2021-04-02 DIAGNOSIS — E860 Dehydration: Secondary | ICD-10-CM | POA: Diagnosis not present

## 2021-04-02 DIAGNOSIS — R112 Nausea with vomiting, unspecified: Secondary | ICD-10-CM | POA: Diagnosis not present

## 2021-04-03 DIAGNOSIS — E860 Dehydration: Secondary | ICD-10-CM | POA: Diagnosis not present

## 2021-04-03 DIAGNOSIS — R1084 Generalized abdominal pain: Secondary | ICD-10-CM | POA: Diagnosis not present

## 2021-04-03 DIAGNOSIS — R112 Nausea with vomiting, unspecified: Secondary | ICD-10-CM | POA: Diagnosis not present

## 2021-04-03 DIAGNOSIS — Z932 Ileostomy status: Secondary | ICD-10-CM | POA: Diagnosis not present

## 2021-04-04 DIAGNOSIS — Z932 Ileostomy status: Secondary | ICD-10-CM | POA: Diagnosis not present

## 2021-04-04 DIAGNOSIS — E860 Dehydration: Secondary | ICD-10-CM | POA: Diagnosis not present

## 2021-04-04 DIAGNOSIS — R112 Nausea with vomiting, unspecified: Secondary | ICD-10-CM | POA: Diagnosis not present

## 2021-04-04 DIAGNOSIS — R1084 Generalized abdominal pain: Secondary | ICD-10-CM | POA: Diagnosis not present

## 2021-04-05 DIAGNOSIS — R112 Nausea with vomiting, unspecified: Secondary | ICD-10-CM | POA: Diagnosis not present

## 2021-04-05 DIAGNOSIS — R1084 Generalized abdominal pain: Secondary | ICD-10-CM | POA: Diagnosis not present

## 2021-04-05 DIAGNOSIS — Z932 Ileostomy status: Secondary | ICD-10-CM | POA: Diagnosis not present

## 2021-04-05 DIAGNOSIS — E860 Dehydration: Secondary | ICD-10-CM | POA: Diagnosis not present

## 2021-04-06 DIAGNOSIS — R112 Nausea with vomiting, unspecified: Secondary | ICD-10-CM | POA: Diagnosis not present

## 2021-04-06 DIAGNOSIS — Z932 Ileostomy status: Secondary | ICD-10-CM | POA: Diagnosis not present

## 2021-04-06 DIAGNOSIS — R1084 Generalized abdominal pain: Secondary | ICD-10-CM | POA: Diagnosis not present

## 2021-04-06 DIAGNOSIS — E860 Dehydration: Secondary | ICD-10-CM | POA: Diagnosis not present

## 2021-04-07 DIAGNOSIS — Z932 Ileostomy status: Secondary | ICD-10-CM | POA: Diagnosis not present

## 2021-04-07 DIAGNOSIS — E860 Dehydration: Secondary | ICD-10-CM | POA: Diagnosis not present

## 2021-04-07 DIAGNOSIS — R112 Nausea with vomiting, unspecified: Secondary | ICD-10-CM | POA: Diagnosis not present

## 2021-04-07 DIAGNOSIS — R1084 Generalized abdominal pain: Secondary | ICD-10-CM | POA: Diagnosis not present

## 2021-04-08 DIAGNOSIS — K91850 Pouchitis: Principal | ICD-10-CM

## 2021-04-08 DIAGNOSIS — E860 Dehydration: Secondary | ICD-10-CM | POA: Diagnosis not present

## 2021-04-08 DIAGNOSIS — M6281 Muscle weakness (generalized): Secondary | ICD-10-CM | POA: Diagnosis not present

## 2021-04-08 DIAGNOSIS — D500 Iron deficiency anemia secondary to blood loss (chronic): Secondary | ICD-10-CM | POA: Diagnosis not present

## 2021-04-08 DIAGNOSIS — R112 Nausea with vomiting, unspecified: Secondary | ICD-10-CM | POA: Diagnosis not present

## 2021-04-08 DIAGNOSIS — R188 Other ascites: Secondary | ICD-10-CM | POA: Diagnosis not present

## 2021-04-08 DIAGNOSIS — Z932 Ileostomy status: Secondary | ICD-10-CM | POA: Diagnosis not present

## 2021-04-08 DIAGNOSIS — R1084 Generalized abdominal pain: Secondary | ICD-10-CM | POA: Diagnosis not present

## 2021-04-08 MED ORDER — CEFDINIR 300 MG CAPSULE
ORAL_CAPSULE | Freq: Two times a day (BID) | ORAL | 1 refills | 90 days | Status: CP
Start: 2021-04-08 — End: 2022-04-08

## 2021-04-09 DIAGNOSIS — Z932 Ileostomy status: Secondary | ICD-10-CM | POA: Diagnosis not present

## 2021-04-09 DIAGNOSIS — E860 Dehydration: Secondary | ICD-10-CM | POA: Diagnosis not present

## 2021-04-09 DIAGNOSIS — R1084 Generalized abdominal pain: Secondary | ICD-10-CM | POA: Diagnosis not present

## 2021-04-09 DIAGNOSIS — R112 Nausea with vomiting, unspecified: Secondary | ICD-10-CM | POA: Diagnosis not present

## 2021-04-10 DIAGNOSIS — R112 Nausea with vomiting, unspecified: Secondary | ICD-10-CM | POA: Diagnosis not present

## 2021-04-10 DIAGNOSIS — Z932 Ileostomy status: Secondary | ICD-10-CM | POA: Diagnosis not present

## 2021-04-10 DIAGNOSIS — E860 Dehydration: Secondary | ICD-10-CM | POA: Diagnosis not present

## 2021-04-10 DIAGNOSIS — R1084 Generalized abdominal pain: Secondary | ICD-10-CM | POA: Diagnosis not present

## 2021-04-11 DIAGNOSIS — G893 Neoplasm related pain (acute) (chronic): Principal | ICD-10-CM

## 2021-04-11 DIAGNOSIS — D481 Neoplasm of uncertain behavior of connective and other soft tissue: Principal | ICD-10-CM

## 2021-04-11 DIAGNOSIS — R5381 Other malaise: Principal | ICD-10-CM

## 2021-04-11 DIAGNOSIS — D500 Iron deficiency anemia secondary to blood loss (chronic): Principal | ICD-10-CM

## 2021-04-11 DIAGNOSIS — I82A11 Acute embolism and thrombosis of right axillary vein: Principal | ICD-10-CM

## 2021-04-11 DIAGNOSIS — D485 Neoplasm of uncertain behavior of skin: Principal | ICD-10-CM

## 2021-04-11 DIAGNOSIS — Z932 Ileostomy status: Secondary | ICD-10-CM | POA: Diagnosis not present

## 2021-04-11 DIAGNOSIS — R1084 Generalized abdominal pain: Secondary | ICD-10-CM | POA: Diagnosis not present

## 2021-04-11 DIAGNOSIS — R112 Nausea with vomiting, unspecified: Secondary | ICD-10-CM | POA: Diagnosis not present

## 2021-04-11 DIAGNOSIS — E860 Dehydration: Secondary | ICD-10-CM | POA: Diagnosis not present

## 2021-04-12 DIAGNOSIS — E860 Dehydration: Secondary | ICD-10-CM | POA: Diagnosis not present

## 2021-04-12 DIAGNOSIS — M6281 Muscle weakness (generalized): Secondary | ICD-10-CM | POA: Diagnosis not present

## 2021-04-12 DIAGNOSIS — R1084 Generalized abdominal pain: Secondary | ICD-10-CM | POA: Diagnosis not present

## 2021-04-12 DIAGNOSIS — R112 Nausea with vomiting, unspecified: Secondary | ICD-10-CM | POA: Diagnosis not present

## 2021-04-12 DIAGNOSIS — Z932 Ileostomy status: Secondary | ICD-10-CM | POA: Diagnosis not present

## 2021-04-13 DIAGNOSIS — R112 Nausea with vomiting, unspecified: Secondary | ICD-10-CM | POA: Diagnosis not present

## 2021-04-13 DIAGNOSIS — R1084 Generalized abdominal pain: Secondary | ICD-10-CM | POA: Diagnosis not present

## 2021-04-13 DIAGNOSIS — E860 Dehydration: Secondary | ICD-10-CM | POA: Diagnosis not present

## 2021-04-13 DIAGNOSIS — Z932 Ileostomy status: Secondary | ICD-10-CM | POA: Diagnosis not present

## 2021-04-14 DIAGNOSIS — E860 Dehydration: Secondary | ICD-10-CM | POA: Diagnosis not present

## 2021-04-14 DIAGNOSIS — R112 Nausea with vomiting, unspecified: Secondary | ICD-10-CM | POA: Diagnosis not present

## 2021-04-14 DIAGNOSIS — R1084 Generalized abdominal pain: Secondary | ICD-10-CM | POA: Diagnosis not present

## 2021-04-14 DIAGNOSIS — Z932 Ileostomy status: Secondary | ICD-10-CM | POA: Diagnosis not present

## 2021-04-15 DIAGNOSIS — R188 Other ascites: Secondary | ICD-10-CM | POA: Diagnosis not present

## 2021-04-15 DIAGNOSIS — D500 Iron deficiency anemia secondary to blood loss (chronic): Secondary | ICD-10-CM | POA: Diagnosis not present

## 2021-04-15 DIAGNOSIS — R112 Nausea with vomiting, unspecified: Secondary | ICD-10-CM | POA: Diagnosis not present

## 2021-04-15 DIAGNOSIS — M6281 Muscle weakness (generalized): Secondary | ICD-10-CM | POA: Diagnosis not present

## 2021-04-15 DIAGNOSIS — E860 Dehydration: Secondary | ICD-10-CM | POA: Diagnosis not present

## 2021-04-15 DIAGNOSIS — R1084 Generalized abdominal pain: Secondary | ICD-10-CM | POA: Diagnosis not present

## 2021-04-15 DIAGNOSIS — Z932 Ileostomy status: Secondary | ICD-10-CM | POA: Diagnosis not present

## 2021-04-16 DIAGNOSIS — Z932 Ileostomy status: Secondary | ICD-10-CM | POA: Diagnosis not present

## 2021-04-16 DIAGNOSIS — R1084 Generalized abdominal pain: Secondary | ICD-10-CM | POA: Diagnosis not present

## 2021-04-16 DIAGNOSIS — E860 Dehydration: Secondary | ICD-10-CM | POA: Diagnosis not present

## 2021-04-16 DIAGNOSIS — R112 Nausea with vomiting, unspecified: Secondary | ICD-10-CM | POA: Diagnosis not present

## 2021-04-17 ENCOUNTER — Institutional Professional Consult (permissible substitution): Admit: 2021-04-17 | Discharge: 2021-04-17 | Payer: PRIVATE HEALTH INSURANCE | Attending: Oncology | Primary: Oncology

## 2021-04-17 DIAGNOSIS — Z5181 Encounter for therapeutic drug level monitoring: Secondary | ICD-10-CM | POA: Diagnosis not present

## 2021-04-17 DIAGNOSIS — E860 Dehydration: Secondary | ICD-10-CM | POA: Diagnosis not present

## 2021-04-17 DIAGNOSIS — R112 Nausea with vomiting, unspecified: Secondary | ICD-10-CM | POA: Diagnosis not present

## 2021-04-17 DIAGNOSIS — Z7901 Long term (current) use of anticoagulants: Secondary | ICD-10-CM | POA: Diagnosis not present

## 2021-04-17 DIAGNOSIS — R1084 Generalized abdominal pain: Secondary | ICD-10-CM | POA: Diagnosis not present

## 2021-04-17 DIAGNOSIS — Z932 Ileostomy status: Secondary | ICD-10-CM | POA: Diagnosis not present

## 2021-04-18 ENCOUNTER — Ambulatory Visit
Admit: 2021-04-18 | Discharge: 2021-04-18 | Payer: PRIVATE HEALTH INSURANCE | Attending: Gastroenterology | Primary: Gastroenterology

## 2021-04-18 DIAGNOSIS — R635 Abnormal weight gain: Principal | ICD-10-CM

## 2021-04-18 DIAGNOSIS — K91850 Pouchitis: Principal | ICD-10-CM

## 2021-04-18 DIAGNOSIS — Z789 Other specified health status: Principal | ICD-10-CM

## 2021-04-18 DIAGNOSIS — R1084 Generalized abdominal pain: Secondary | ICD-10-CM | POA: Diagnosis not present

## 2021-04-18 DIAGNOSIS — D126 Benign neoplasm of colon, unspecified: Secondary | ICD-10-CM | POA: Diagnosis not present

## 2021-04-18 DIAGNOSIS — R112 Nausea with vomiting, unspecified: Secondary | ICD-10-CM | POA: Diagnosis not present

## 2021-04-18 DIAGNOSIS — E860 Dehydration: Secondary | ICD-10-CM | POA: Diagnosis not present

## 2021-04-18 DIAGNOSIS — Z932 Ileostomy status: Secondary | ICD-10-CM | POA: Diagnosis not present

## 2021-04-18 MED ORDER — FUROSEMIDE 20 MG TABLET
ORAL_TABLET | Freq: Every day | ORAL | 0 refills | 15.00000 days | Status: CP
Start: 2021-04-18 — End: 2022-04-18

## 2021-04-18 MED ORDER — CIPROFLOXACIN 500 MG TABLET
ORAL_TABLET | Freq: Two times a day (BID) | ORAL | 0 refills | 28 days | Status: CP
Start: 2021-04-18 — End: 2021-05-16

## 2021-04-19 DIAGNOSIS — R1084 Generalized abdominal pain: Secondary | ICD-10-CM | POA: Diagnosis not present

## 2021-04-19 DIAGNOSIS — R112 Nausea with vomiting, unspecified: Secondary | ICD-10-CM | POA: Diagnosis not present

## 2021-04-19 DIAGNOSIS — E860 Dehydration: Secondary | ICD-10-CM | POA: Diagnosis not present

## 2021-04-19 DIAGNOSIS — Z932 Ileostomy status: Secondary | ICD-10-CM | POA: Diagnosis not present

## 2021-04-20 DIAGNOSIS — E860 Dehydration: Secondary | ICD-10-CM | POA: Diagnosis not present

## 2021-04-20 DIAGNOSIS — R112 Nausea with vomiting, unspecified: Secondary | ICD-10-CM | POA: Diagnosis not present

## 2021-04-20 DIAGNOSIS — R1084 Generalized abdominal pain: Secondary | ICD-10-CM | POA: Diagnosis not present

## 2021-04-20 DIAGNOSIS — Z932 Ileostomy status: Secondary | ICD-10-CM | POA: Diagnosis not present

## 2021-04-21 DIAGNOSIS — E860 Dehydration: Secondary | ICD-10-CM | POA: Diagnosis not present

## 2021-04-21 DIAGNOSIS — R1084 Generalized abdominal pain: Secondary | ICD-10-CM | POA: Diagnosis not present

## 2021-04-21 DIAGNOSIS — Z932 Ileostomy status: Secondary | ICD-10-CM | POA: Diagnosis not present

## 2021-04-21 DIAGNOSIS — R112 Nausea with vomiting, unspecified: Secondary | ICD-10-CM | POA: Diagnosis not present

## 2021-04-22 DIAGNOSIS — M6281 Muscle weakness (generalized): Secondary | ICD-10-CM | POA: Diagnosis not present

## 2021-04-22 DIAGNOSIS — Z932 Ileostomy status: Secondary | ICD-10-CM | POA: Diagnosis not present

## 2021-04-22 DIAGNOSIS — E860 Dehydration: Secondary | ICD-10-CM | POA: Diagnosis not present

## 2021-04-22 DIAGNOSIS — D500 Iron deficiency anemia secondary to blood loss (chronic): Secondary | ICD-10-CM | POA: Diagnosis not present

## 2021-04-22 DIAGNOSIS — R1084 Generalized abdominal pain: Secondary | ICD-10-CM | POA: Diagnosis not present

## 2021-04-22 DIAGNOSIS — R112 Nausea with vomiting, unspecified: Secondary | ICD-10-CM | POA: Diagnosis not present

## 2021-04-22 DIAGNOSIS — R188 Other ascites: Secondary | ICD-10-CM | POA: Diagnosis not present

## 2021-04-23 ENCOUNTER — Ambulatory Visit: Admit: 2021-04-23 | Discharge: 2021-04-24 | Payer: PRIVATE HEALTH INSURANCE

## 2021-04-23 DIAGNOSIS — E860 Dehydration: Secondary | ICD-10-CM | POA: Diagnosis not present

## 2021-04-23 DIAGNOSIS — R635 Abnormal weight gain: Secondary | ICD-10-CM | POA: Diagnosis not present

## 2021-04-23 DIAGNOSIS — R112 Nausea with vomiting, unspecified: Secondary | ICD-10-CM | POA: Diagnosis not present

## 2021-04-23 DIAGNOSIS — R1084 Generalized abdominal pain: Secondary | ICD-10-CM | POA: Diagnosis not present

## 2021-04-23 DIAGNOSIS — R140 Abdominal distension (gaseous): Secondary | ICD-10-CM | POA: Diagnosis not present

## 2021-04-23 DIAGNOSIS — R932 Abnormal findings on diagnostic imaging of liver and biliary tract: Secondary | ICD-10-CM | POA: Diagnosis not present

## 2021-04-23 DIAGNOSIS — Z86718 Personal history of other venous thrombosis and embolism: Secondary | ICD-10-CM | POA: Diagnosis not present

## 2021-04-23 DIAGNOSIS — Z932 Ileostomy status: Secondary | ICD-10-CM | POA: Diagnosis not present

## 2021-04-23 DIAGNOSIS — R1011 Right upper quadrant pain: Secondary | ICD-10-CM | POA: Diagnosis not present

## 2021-04-24 ENCOUNTER — Ambulatory Visit
Admit: 2021-04-24 | Discharge: 2021-04-25 | Payer: PRIVATE HEALTH INSURANCE | Attending: "Endocrinology | Primary: "Endocrinology

## 2021-04-24 DIAGNOSIS — Q8789 Other specified congenital malformation syndromes, not elsewhere classified: Principal | ICD-10-CM

## 2021-04-24 DIAGNOSIS — Z932 Ileostomy status: Secondary | ICD-10-CM | POA: Diagnosis not present

## 2021-04-24 DIAGNOSIS — R112 Nausea with vomiting, unspecified: Secondary | ICD-10-CM | POA: Diagnosis not present

## 2021-04-24 DIAGNOSIS — E860 Dehydration: Secondary | ICD-10-CM | POA: Diagnosis not present

## 2021-04-24 DIAGNOSIS — R1084 Generalized abdominal pain: Secondary | ICD-10-CM | POA: Diagnosis not present

## 2021-04-24 DIAGNOSIS — Z6821 Body mass index (BMI) 21.0-21.9, adult: Secondary | ICD-10-CM | POA: Diagnosis not present

## 2021-04-25 DIAGNOSIS — R1084 Generalized abdominal pain: Secondary | ICD-10-CM | POA: Diagnosis not present

## 2021-04-25 DIAGNOSIS — E860 Dehydration: Secondary | ICD-10-CM | POA: Diagnosis not present

## 2021-04-25 DIAGNOSIS — M6281 Muscle weakness (generalized): Secondary | ICD-10-CM | POA: Diagnosis not present

## 2021-04-25 DIAGNOSIS — R112 Nausea with vomiting, unspecified: Secondary | ICD-10-CM | POA: Diagnosis not present

## 2021-04-25 DIAGNOSIS — Z932 Ileostomy status: Secondary | ICD-10-CM | POA: Diagnosis not present

## 2021-04-25 MED ORDER — MIRTAZAPINE 30 MG TABLET
ORAL_TABLET | Freq: Every evening | ORAL | 2 refills | 60 days | Status: CP
Start: 2021-04-25 — End: 2021-06-24

## 2021-04-29 ENCOUNTER — Ambulatory Visit: Admit: 2021-04-29 | Discharge: 2021-04-30 | Payer: PRIVATE HEALTH INSURANCE

## 2021-04-29 DIAGNOSIS — D500 Iron deficiency anemia secondary to blood loss (chronic): Secondary | ICD-10-CM | POA: Diagnosis not present

## 2021-04-29 DIAGNOSIS — Z932 Ileostomy status: Secondary | ICD-10-CM | POA: Diagnosis not present

## 2021-04-29 DIAGNOSIS — R188 Other ascites: Secondary | ICD-10-CM | POA: Diagnosis not present

## 2021-04-29 DIAGNOSIS — R1084 Generalized abdominal pain: Secondary | ICD-10-CM | POA: Diagnosis not present

## 2021-04-29 DIAGNOSIS — R112 Nausea with vomiting, unspecified: Secondary | ICD-10-CM | POA: Diagnosis not present

## 2021-04-29 DIAGNOSIS — Q8789 Other specified congenital malformation syndromes, not elsewhere classified: Secondary | ICD-10-CM | POA: Diagnosis not present

## 2021-05-01 ENCOUNTER — Ambulatory Visit
Admit: 2021-05-01 | Discharge: 2021-05-01 | Payer: PRIVATE HEALTH INSURANCE | Attending: Gastroenterology | Primary: Gastroenterology

## 2021-05-01 ENCOUNTER — Ambulatory Visit: Admit: 2021-05-01 | Discharge: 2021-05-01 | Payer: PRIVATE HEALTH INSURANCE

## 2021-05-01 DIAGNOSIS — E0590 Thyrotoxicosis, unspecified without thyrotoxic crisis or storm: Principal | ICD-10-CM

## 2021-05-01 DIAGNOSIS — I82711 Chronic embolism and thrombosis of superficial veins of right upper extremity: Secondary | ICD-10-CM | POA: Diagnosis not present

## 2021-05-01 DIAGNOSIS — I871 Compression of vein: Secondary | ICD-10-CM | POA: Diagnosis not present

## 2021-05-03 ENCOUNTER — Ambulatory Visit: Admit: 2021-05-03 | Discharge: 2021-05-04 | Payer: PRIVATE HEALTH INSURANCE

## 2021-05-03 DIAGNOSIS — E860 Dehydration: Secondary | ICD-10-CM | POA: Diagnosis not present

## 2021-05-03 DIAGNOSIS — I8290 Acute embolism and thrombosis of unspecified vein: Secondary | ICD-10-CM | POA: Diagnosis not present

## 2021-05-03 DIAGNOSIS — E43 Unspecified severe protein-calorie malnutrition: Secondary | ICD-10-CM | POA: Diagnosis not present

## 2021-05-03 DIAGNOSIS — Z8616 Personal history of COVID-19: Secondary | ICD-10-CM | POA: Diagnosis not present

## 2021-05-03 DIAGNOSIS — Z7901 Long term (current) use of anticoagulants: Secondary | ICD-10-CM | POA: Diagnosis not present

## 2021-05-03 DIAGNOSIS — R1084 Generalized abdominal pain: Secondary | ICD-10-CM | POA: Diagnosis not present

## 2021-05-03 DIAGNOSIS — Z6821 Body mass index (BMI) 21.0-21.9, adult: Secondary | ICD-10-CM | POA: Diagnosis not present

## 2021-05-03 DIAGNOSIS — Z792 Long term (current) use of antibiotics: Secondary | ICD-10-CM | POA: Diagnosis not present

## 2021-05-03 DIAGNOSIS — Z9049 Acquired absence of other specified parts of digestive tract: Secondary | ICD-10-CM | POA: Diagnosis not present

## 2021-05-03 DIAGNOSIS — D509 Iron deficiency anemia, unspecified: Secondary | ICD-10-CM | POA: Diagnosis not present

## 2021-05-03 DIAGNOSIS — Z932 Ileostomy status: Secondary | ICD-10-CM | POA: Diagnosis not present

## 2021-05-03 DIAGNOSIS — D500 Iron deficiency anemia secondary to blood loss (chronic): Secondary | ICD-10-CM | POA: Diagnosis not present

## 2021-05-03 DIAGNOSIS — I82A11 Acute embolism and thrombosis of right axillary vein: Secondary | ICD-10-CM | POA: Diagnosis not present

## 2021-05-03 DIAGNOSIS — Z86718 Personal history of other venous thrombosis and embolism: Secondary | ICD-10-CM | POA: Diagnosis not present

## 2021-05-03 DIAGNOSIS — Z885 Allergy status to narcotic agent status: Secondary | ICD-10-CM | POA: Diagnosis not present

## 2021-05-03 DIAGNOSIS — R112 Nausea with vomiting, unspecified: Secondary | ICD-10-CM | POA: Diagnosis not present

## 2021-05-03 DIAGNOSIS — Z881 Allergy status to other antibiotic agents status: Secondary | ICD-10-CM | POA: Diagnosis not present

## 2021-05-03 MED ORDER — APIXABAN 2.5 MG TABLET
ORAL_TABLET | Freq: Two times a day (BID) | ORAL | 2 refills | 30 days | Status: CP
Start: 2021-05-03 — End: 2021-08-01

## 2021-05-04 DIAGNOSIS — R112 Nausea with vomiting, unspecified: Secondary | ICD-10-CM | POA: Diagnosis not present

## 2021-05-04 DIAGNOSIS — R1084 Generalized abdominal pain: Secondary | ICD-10-CM | POA: Diagnosis not present

## 2021-05-04 DIAGNOSIS — E860 Dehydration: Secondary | ICD-10-CM | POA: Diagnosis not present

## 2021-05-04 DIAGNOSIS — Z932 Ileostomy status: Secondary | ICD-10-CM | POA: Diagnosis not present

## 2021-05-05 DIAGNOSIS — Z932 Ileostomy status: Secondary | ICD-10-CM | POA: Diagnosis not present

## 2021-05-05 DIAGNOSIS — E860 Dehydration: Secondary | ICD-10-CM | POA: Diagnosis not present

## 2021-05-05 DIAGNOSIS — R112 Nausea with vomiting, unspecified: Secondary | ICD-10-CM | POA: Diagnosis not present

## 2021-05-05 DIAGNOSIS — R1084 Generalized abdominal pain: Secondary | ICD-10-CM | POA: Diagnosis not present

## 2021-05-06 ENCOUNTER — Ambulatory Visit: Admit: 2021-05-06 | Discharge: 2021-05-07 | Payer: PRIVATE HEALTH INSURANCE

## 2021-05-06 DIAGNOSIS — R1084 Generalized abdominal pain: Secondary | ICD-10-CM | POA: Diagnosis not present

## 2021-05-06 DIAGNOSIS — Z932 Ileostomy status: Secondary | ICD-10-CM | POA: Diagnosis not present

## 2021-05-06 DIAGNOSIS — D500 Iron deficiency anemia secondary to blood loss (chronic): Secondary | ICD-10-CM | POA: Diagnosis not present

## 2021-05-06 DIAGNOSIS — R112 Nausea with vomiting, unspecified: Secondary | ICD-10-CM | POA: Diagnosis not present

## 2021-05-06 DIAGNOSIS — R188 Other ascites: Secondary | ICD-10-CM | POA: Diagnosis not present

## 2021-05-06 DIAGNOSIS — E860 Dehydration: Secondary | ICD-10-CM | POA: Diagnosis not present

## 2021-05-07 DIAGNOSIS — R112 Nausea with vomiting, unspecified: Secondary | ICD-10-CM | POA: Diagnosis not present

## 2021-05-07 DIAGNOSIS — Z932 Ileostomy status: Secondary | ICD-10-CM | POA: Diagnosis not present

## 2021-05-07 DIAGNOSIS — E860 Dehydration: Secondary | ICD-10-CM | POA: Diagnosis not present

## 2021-05-07 DIAGNOSIS — R1084 Generalized abdominal pain: Secondary | ICD-10-CM | POA: Diagnosis not present

## 2021-05-08 DIAGNOSIS — R112 Nausea with vomiting, unspecified: Secondary | ICD-10-CM | POA: Diagnosis not present

## 2021-05-08 DIAGNOSIS — E860 Dehydration: Secondary | ICD-10-CM | POA: Diagnosis not present

## 2021-05-08 DIAGNOSIS — Z932 Ileostomy status: Secondary | ICD-10-CM | POA: Diagnosis not present

## 2021-05-08 DIAGNOSIS — R1084 Generalized abdominal pain: Secondary | ICD-10-CM | POA: Diagnosis not present

## 2021-05-09 DIAGNOSIS — R112 Nausea with vomiting, unspecified: Secondary | ICD-10-CM | POA: Diagnosis not present

## 2021-05-09 DIAGNOSIS — R1084 Generalized abdominal pain: Secondary | ICD-10-CM | POA: Diagnosis not present

## 2021-05-09 DIAGNOSIS — Z932 Ileostomy status: Secondary | ICD-10-CM | POA: Diagnosis not present

## 2021-05-09 DIAGNOSIS — E860 Dehydration: Secondary | ICD-10-CM | POA: Diagnosis not present

## 2021-05-10 DIAGNOSIS — R1084 Generalized abdominal pain: Secondary | ICD-10-CM | POA: Diagnosis not present

## 2021-05-10 DIAGNOSIS — E860 Dehydration: Secondary | ICD-10-CM | POA: Diagnosis not present

## 2021-05-10 DIAGNOSIS — Z932 Ileostomy status: Secondary | ICD-10-CM | POA: Diagnosis not present

## 2021-05-10 DIAGNOSIS — R112 Nausea with vomiting, unspecified: Secondary | ICD-10-CM | POA: Diagnosis not present

## 2021-05-11 DIAGNOSIS — E860 Dehydration: Secondary | ICD-10-CM | POA: Diagnosis not present

## 2021-05-11 DIAGNOSIS — R112 Nausea with vomiting, unspecified: Secondary | ICD-10-CM | POA: Diagnosis not present

## 2021-05-11 DIAGNOSIS — R1084 Generalized abdominal pain: Secondary | ICD-10-CM | POA: Diagnosis not present

## 2021-05-11 DIAGNOSIS — Z932 Ileostomy status: Secondary | ICD-10-CM | POA: Diagnosis not present

## 2021-05-12 DIAGNOSIS — E860 Dehydration: Secondary | ICD-10-CM | POA: Diagnosis not present

## 2021-05-12 DIAGNOSIS — R1084 Generalized abdominal pain: Secondary | ICD-10-CM | POA: Diagnosis not present

## 2021-05-12 DIAGNOSIS — Z932 Ileostomy status: Secondary | ICD-10-CM | POA: Diagnosis not present

## 2021-05-12 DIAGNOSIS — R112 Nausea with vomiting, unspecified: Secondary | ICD-10-CM | POA: Diagnosis not present

## 2021-05-13 ENCOUNTER — Emergency Department (HOSPITAL_COMMUNITY): Payer: BC Managed Care – PPO

## 2021-05-13 ENCOUNTER — Emergency Department (HOSPITAL_COMMUNITY)
Admission: EM | Admit: 2021-05-13 | Discharge: 2021-05-14 | Disposition: A | Discharge status: Home / Self Care | Payer: BC Managed Care – PPO | Attending: Emergency Medicine | Admitting: Emergency Medicine

## 2021-05-13 ENCOUNTER — Ambulatory Visit: Admit: 2021-05-13 | Discharge: 2021-05-14 | Payer: PRIVATE HEALTH INSURANCE

## 2021-05-13 DIAGNOSIS — R1084 Generalized abdominal pain: Secondary | ICD-10-CM | POA: Diagnosis not present

## 2021-05-13 DIAGNOSIS — Z86010 Personal history of colonic polyps: Secondary | ICD-10-CM | POA: Insufficient documentation

## 2021-05-13 DIAGNOSIS — N9489 Other specified conditions associated with female genital organs and menstrual cycle: Secondary | ICD-10-CM | POA: Insufficient documentation

## 2021-05-13 DIAGNOSIS — T7840XA Allergy, unspecified, initial encounter: Secondary | ICD-10-CM | POA: Diagnosis not present

## 2021-05-13 DIAGNOSIS — R112 Nausea with vomiting, unspecified: Secondary | ICD-10-CM | POA: Diagnosis not present

## 2021-05-13 DIAGNOSIS — R0789 Other chest pain: Secondary | ICD-10-CM | POA: Diagnosis not present

## 2021-05-13 DIAGNOSIS — R188 Other ascites: Secondary | ICD-10-CM | POA: Diagnosis not present

## 2021-05-13 DIAGNOSIS — D500 Iron deficiency anemia secondary to blood loss (chronic): Secondary | ICD-10-CM | POA: Diagnosis not present

## 2021-05-13 DIAGNOSIS — E860 Dehydration: Secondary | ICD-10-CM | POA: Diagnosis not present

## 2021-05-13 DIAGNOSIS — Z932 Ileostomy status: Secondary | ICD-10-CM | POA: Diagnosis not present

## 2021-05-13 LAB — CBC
HCT: 45.3 % (ref 36.0–46.0)
Hemoglobin: 14.4 g/dL (ref 12.0–15.0)
MCH: 27 pg (ref 26.0–34.0)
MCHC: 31.8 g/dL (ref 30.0–36.0)
MCV: 84.8 fL (ref 80.0–100.0)
Platelets: 255 10*3/uL (ref 150–400)
RBC: 5.34 MIL/uL — ABNORMAL HIGH (ref 3.87–5.11)
RDW: 14 % (ref 11.5–15.5)
WBC: 9 10*3/uL (ref 4.0–10.5)
nRBC: 0 % (ref 0.0–0.2)

## 2021-05-13 LAB — BASIC METABOLIC PANEL
Anion gap: 11 (ref 5–15)
BUN: 6 mg/dL (ref 6–20)
CO2: 21 mmol/L — ABNORMAL LOW (ref 22–32)
Calcium: 9.4 mg/dL (ref 8.9–10.3)
Chloride: 106 mmol/L (ref 98–111)
Creatinine, Ser: 0.66 mg/dL (ref 0.44–1.00)
GFR, Estimated: >60 mL/min (ref >60)
Glucose, Bld: 97 mg/dL (ref 70–99)
Potassium: 3.4 mmol/L — ABNORMAL LOW (ref 3.5–5.1)
Sodium: 138 mmol/L (ref 135–145)

## 2021-05-13 LAB — I-STAT BETA HCG BLOOD, ED (MC, WL, AP ONLY): I-stat hCG, quantitative: <5 m[IU]/mL (ref <5)

## 2021-05-13 LAB — TROPONIN I (HIGH SENSITIVITY)
Troponin I (High Sensitivity): <2 ng/L (ref <18)
Troponin I (High Sensitivity): <2 ng/L (ref <18)

## 2021-05-13 MED ORDER — METHYLPREDNISOLONE SODIUM SUCC 125 MG IJ SOLR
125.0000 mg | Freq: Once | INTRAMUSCULAR | Status: AC
Start: 2021-05-13 — End: 2021-05-13
  Administered 2021-05-13: 125 mg via INTRAMUSCULAR
  Filled 2021-05-13: qty 2

## 2021-05-13 MED ORDER — DIPHENHYDRAMINE HCL 25 MG PO CAPS
50.0000 mg | ORAL_CAPSULE | Freq: Once | ORAL | Status: AC
  Administered 2021-05-13: 50 mg via ORAL
  Filled 2021-05-13: qty 2

## 2021-05-13 MED ORDER — ONDANSETRON HCL 4 MG/2ML IJ SOLN
4.0000 mg | Freq: Once | INTRAMUSCULAR | Status: AC
  Administered 2021-05-13: 4 mg via INTRAVENOUS

## 2021-05-13 NOTE — ED Triage Notes (Incomplete)
Pt c/o potential allergic reaction to iron infusion today. At-home nurse was changing bandages at port site at home, numbness/tingling and swelling in hands/feet. Associated rash, sore thorat, chest pain, SHOB. States she can't close her hands without "major burning" ?

## 2021-05-13 NOTE — ED Provider Triage Note (Signed)
Emergency Medicine Provider Triage Evaluation Note ? ?Lisa Donovan , a 21 y.o. female  was evaluated in triage.  Patient presents emergency department with a possible allergic reaction.  She says she was getting an iron infusion today and felt fine during the infusion.  She has had these infusions multiple times without problems.  Says that she was walking out she felt like her throat was really tight.  She noticed some tingling in both of her feet and her arms.  She says that she also noticed some chest pressure since then.  She says she also developed a rash on all of her extremities and torso.  She thinks that symptoms have progressively worsened.  She had some intermittent swelling of her left ankle. ?She is accompanied by mom who provides a lot of the history ? ?She has a right sided port.  She has a history of colectomy and required TPN for a while.  She has been off TPN for 2 weeks and has now kept it to get her iron infusions.  She also states she has tumors everywhere in her body. Unclear what this is from ? ?Review of Systems  ?Positive: Chest pain, paresthesias, rash ?Negative:  ? ?Physical Exam  ?BP 137/87 (BP Location: Left Arm)   Pulse 99   Temp 97.9 ?F (36.6 ?C) (Oral)   Resp 18   SpO2 99%  ?Gen:   Awake, no distress   ?Resp:  Normal effort  ?MSK:   Moves extremities without difficulty  ?Other:  Protecting airway, Subtle papular rash on extremities.  ? ?Medical Decision Making  ?Medically screening exam initiated at 5:38 PM.  Appropriate orders placed.  Lisa Donovan was informed that the remainder of the evaluation will be completed by another provider, this initial triage assessment does not replace that evaluation, and the importance of remaining in the ED until their evaluation is complete. ? ?Will give solumedrol and benadryl here. Zofran for nausea.  ?  Lisa Birchwood, PA-C ?05/13/21 1744 ? ?

## 2021-05-14 DIAGNOSIS — Z932 Ileostomy status: Secondary | ICD-10-CM | POA: Diagnosis not present

## 2021-05-14 DIAGNOSIS — T7840XA Allergy, unspecified, initial encounter: Secondary | ICD-10-CM | POA: Diagnosis not present

## 2021-05-14 DIAGNOSIS — E860 Dehydration: Secondary | ICD-10-CM | POA: Diagnosis not present

## 2021-05-14 DIAGNOSIS — R0789 Other chest pain: Secondary | ICD-10-CM | POA: Diagnosis not present

## 2021-05-14 DIAGNOSIS — R1084 Generalized abdominal pain: Secondary | ICD-10-CM | POA: Diagnosis not present

## 2021-05-14 DIAGNOSIS — R112 Nausea with vomiting, unspecified: Secondary | ICD-10-CM | POA: Diagnosis not present

## 2021-05-14 LAB — HEPATIC FUNCTION PANEL
ALT: 13 U/L (ref 0–44)
AST: 22 U/L (ref 15–41)
Albumin: 3.9 g/dL (ref 3.5–5.0)
Alkaline Phosphatase: 49 U/L (ref 38–126)
Bilirubin, Direct: <0.1 mg/dL (ref 0.0–0.2)
Total Bilirubin: 0.4 mg/dL (ref 0.3–1.2)
Total Protein: 6.8 g/dL (ref 6.5–8.1)

## 2021-05-14 MED ORDER — HEPARIN SOD (PORK) LOCK FLUSH 100 UNIT/ML IV SOLN
500.0000 [IU] | Freq: Once | INTRAVENOUS | Status: AC
  Administered 2021-05-14: 500 [IU]
  Filled 2021-05-14: qty 5

## 2021-05-14 MED ORDER — FAMOTIDINE 20 MG PO TABS: 20.0000 mg | ORAL_TABLET | Freq: Two times a day (BID) | ORAL | 0 refills | Status: AC

## 2021-05-14 MED ORDER — PREDNISONE 20 MG PO TABS: 40.0000 mg | ORAL_TABLET | Freq: Every day | ORAL | 0 refills | Status: AC

## 2021-05-14 MED ORDER — DIPHENHYDRAMINE HCL 25 MG PO TABS: 25.0000 mg | ORAL_TABLET | Freq: Four times a day (QID) | ORAL | 0 refills | Status: DC

## 2021-05-14 NOTE — Discharge Instructions (Addendum)
You were evaluated in the Emergency Department and after careful evaluation, we did not find any emergent condition requiring admission or further testing in the hospital. ? ?Your exam/testing today was overall reassuring.  Symptoms likely due to an allergic reaction.  Recommend taking the prednisone daily as directed.  Also recommend using Benadryl and Pepcid as needed for swelling or itching or rash. ? ?Please return to the Emergency Department if you experience any worsening of your condition.  Thank you for allowing [name] to be a part of your care. ? ?

## 2021-05-14 NOTE — ED Provider Notes (Addendum)
?[place] DEPT ?[facility] Emergency Department ?Provider Note ?MRN:  [id number]  ?Arrival date & time: 05/14/21    ? ?Chief Complaint   ?Allergic Reaction ?  ?History of Present Illness   ?Lisa Donovan is a 21 y.o. 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 ? ?Patient received her iron infusion yesterday and then shortly after began experiencing a throat closing sensation, felt like swelling.  Then experienced full body hives, edema to the arms and legs.  No nausea vomiting or diarrhea.  Feeling better after steroid shot given here in the emergency department. ? ?Review of Systems  ?A thorough review of systems was obtained and all systems are negative except as noted in the HPI and PMH.  ? ?Patient's Health History   ? ?Past Medical History:  ?Diagnosis Date  ? Anemia   ? Colon polyps   ? Dx at age 86, has colonoscopy yearly  ? [name]   ? [name]   ? Dx at age 62  ? GERD (gastroesophageal reflux disease)   ? H/O colonoscopy   ? Pt has has them yearly  ? History of endoscopy   ? Ileus ([place])   ?  ?Past Surgical History:  ?Procedure Laterality Date  ? APPENDECTOMY    ? COLOPROCTECTOMY W/ ILEO J POUCH    ? EVALUATION UNDER ANESTHESIA WITH ANAL FISTULECTOMY N/A 02/21/2021  ? Procedure: EXAM UNDER ANESTHESIA WITH ANAL FISTULECTOMY;  Surgeon: Erroll Luna, MD;  Location: [facility];  Service: General;  Laterality: N/A;  ? Oral surgery    ? Tumor removed    ? Numerous surgies since she was 67 months old  ?  ?No family history on file.  ?Social History  ? ?Socioeconomic History  ? Marital status: Single  ?  Spouse name: Not on file  ? Number of children: Not on file  ? Years of education: Not on file  ? Highest education level: Not on file  ?Occupational History  ? Not on file  ?Tobacco Use  ? Smoking status: Never  ? Smokeless tobacco: Never  ?Vaping Use  ? Vaping Use: Never used  ?Substance and Sexual Activity  ? Alcohol  use: Never  ? Drug use: Never  ? Sexual activity: Never  ?Other Topics Concern  ? Not on file  ?Social History Narrative  ? 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 Has boyfriend -- a year and half  ? Likes to sing  ? ?Social Determinants of Health  ? ?Financial Resource Strain: Not on file  ?Food Insecurity: Not on file  ?Transportation Needs: Not on file  ?Physical Activity: Not on file  ?Stress: Not on file  ?Social Connections: Not on file  ?Intimate Partner Violence: Not on file  ?  ? ?Physical Exam  ? ?Vitals:  ? 05/14/21 0230 05/14/21 0630  ?BP: 129/76 122/62  ?Pulse: 87 76  ?Resp: 16 15  ?Temp:    ?SpO2: 99% 98%  ?  ?CONSTITUTIONAL: Well-appearing, NAD ?NEURO/PSYCH:  Alert and oriented x 3, no focal deficits ?EYES:  eyes equal and reactive ?ENT/NECK:  no LAD, no JVD ?CARDIO: Regular rate, well-perfused, normal S1 and S2 ?PULM:  CTAB no wheezing or rhonchi ?GI/GU:  non-distended, non-tender ?MSK/SPINE:  No gross deformities, no edema ?SKIN: Scattered erythematous rash to the thoracic back ? ? ?*Additional and/or pertinent findings included in MDM below ? ?Diagnostic and Interventional  Summary  ? ? EKG Interpretation ? ?Date/Time:  Tuesday May 14 2021 05:40:04 EST ?Ventricular Rate:  73 ?PR Interval:  113 ?QRS Duration: 92 ?QT Interval:  403 ?QTC Calculation: 445 ?R Axis:   71 ?Text Interpretation: Sinus rhythm Borderline short PR interval Borderline repolarization abnormality Confirmed by Gerlene Fee [phone]) on 05/14/2021 7:10:10 AM ?  ? ?  ? ?Labs Reviewed  ?BASIC METABOLIC PANEL - Abnormal; Notable for the following components:  ?    Result Value  ? Potassium 3.4 (*)   ? CO2 21 (*)   ? All other components within normal limits  ?CBC - Abnormal; Notable for the following components:  ? RBC 5.34 (*)   ? All other components within normal limits  ?HEPATIC FUNCTION PANEL  ?I-STAT BETA HCG BLOOD, ED ([facility], [facility], [facility] ONLY)  ?TROPONIN I (HIGH SENSITIVITY)  ?TROPONIN I (HIGH  SENSITIVITY)  ?  ?DG Chest 1 View  ?Final Result  ?  ?  ?Medications  ?diphenhydrAMINE (BENADRYL) capsule 50 mg (50 mg Oral Given 05/13/21 1746)  ?methylPREDNISolone sodium succinate (SOLU-MEDROL) 125 mg/2 mL injection 125 mg (125 mg Intramuscular Given 05/13/21 1746)  ?ondansetron (ZOFRAN) injection 4 mg (4 mg Intravenous Given 05/13/21 1746)  ?heparin lock flush 100 unit/mL (500 Units Intracatheter Given 05/14/21 0622)  ?  ? ?Procedures  /  Critical Care ?Procedures ? ?ED Course and Medical Decision Making  ?Initial Impression and Ddx ?Symptoms shortly after iron infusion are suspicious for allergic reaction.  Has been having some on and off abdominal pain which is not uncommon for her.  Has waited in the waiting room for 12 hours with improvement of symptoms.  Complicated medical history with [name], [name], desmoid tumors.  Follow[facility] at [facility].  Currently not on chemotherapy.  Awaiting hepatic function, if reassuring patient would be appropriate for discharge on steroids, antihistamines. ? ?Past medical/surgical history that increases complexity of ED encounter: [name] ? ?Interpretation of Diagnostics ?Labs overall reassuring with no significant blood count or electrolyte disturbance.  Awaiting liver function. ? ?Patient Reassessment and Ultimate Disposition/Management ?Anticipating discharge on steroid burst. ? ?Patient management required discussion with the following services or consulting groups:  None ? ?Complexity of Problems Addressed ?Acute illness or injury that poses threat of life of bodily function ? ?Additional Data Reviewed and Analyzed ?Further history obtained from: ?Further history from spouse/family member ? ?Additional Factors Impacting ED Encounter Risk ?None ? ?Barth Kirks. Sedonia Small, MD ?[facility] Emergency Medicine ?[email or web address] ? ?Final Clinical Impressions(s) / ED Diagnoses  ? ?  ICD-10-CM   ?1. Allergic reaction, initial encounter  T78.40XA   ?  ?2.  Allergic reaction  T78.40XA DG Chest 1 View  ?  DG Chest 1 View  ?  ?  ?ED Discharge Orders   ? ?      Ordered  ?  predniSONE (DELTASONE) 20 MG tablet  Daily       ? 05/14/21 0656  ?  diphenhydrAMINE (BENADRYL) 25 MG tablet  Every 6 hours       ? 05/14/21 0656  ?  famotidine (PEPCID) 20 MG tablet  2 times daily       ? 05/14/21 0656  ? ?  ?  ? ?  ?  ? ?Discharge Instructions Discussed with and Provided to Patient:  ? ? ? ?Discharge Instructions   ? ?  ?You were evaluated in the Emergency Department and after careful evaluation, we did not find any emergent condition requiring admission  or further testing in the hospital. ? ?Your exam/testing today was overall reassuring.  Symptoms likely due to an allergic reaction.  Recommend taking the prednisone daily as directed.  Also recommend using Benadryl and Pepcid as needed for swelling or itching or rash. ? ?Please return to the Emergency Department if you experience any worsening of your condition.  Thank you for allowing [name] to be a part of your care. ? ? ? ? ? ?  ?Maudie Flakes, MD ?05/14/21 [phone] ? ?  ?Maudie Flakes, MD ?05/14/21 0710 ? ?

## 2021-05-15 DIAGNOSIS — Z932 Ileostomy status: Secondary | ICD-10-CM | POA: Diagnosis not present

## 2021-05-15 DIAGNOSIS — R112 Nausea with vomiting, unspecified: Secondary | ICD-10-CM | POA: Diagnosis not present

## 2021-05-15 DIAGNOSIS — E860 Dehydration: Secondary | ICD-10-CM | POA: Diagnosis not present

## 2021-05-15 DIAGNOSIS — R1084 Generalized abdominal pain: Secondary | ICD-10-CM | POA: Diagnosis not present

## 2021-05-16 ENCOUNTER — Ambulatory Visit
Admit: 2021-05-16 | Discharge: 2021-05-17 | Payer: PRIVATE HEALTH INSURANCE | Attending: Gastroenterology | Primary: Gastroenterology

## 2021-05-16 DIAGNOSIS — Z452 Encounter for adjustment and management of vascular access device: Principal | ICD-10-CM

## 2021-05-16 DIAGNOSIS — R112 Nausea with vomiting, unspecified: Secondary | ICD-10-CM | POA: Diagnosis not present

## 2021-05-16 DIAGNOSIS — R109 Unspecified abdominal pain: Secondary | ICD-10-CM | POA: Diagnosis not present

## 2021-05-16 DIAGNOSIS — M6281 Muscle weakness (generalized): Secondary | ICD-10-CM | POA: Diagnosis not present

## 2021-05-16 DIAGNOSIS — Z932 Ileostomy status: Secondary | ICD-10-CM | POA: Diagnosis not present

## 2021-05-16 DIAGNOSIS — E860 Dehydration: Secondary | ICD-10-CM | POA: Diagnosis not present

## 2021-05-16 DIAGNOSIS — G8929 Other chronic pain: Secondary | ICD-10-CM | POA: Diagnosis not present

## 2021-05-16 DIAGNOSIS — R1084 Generalized abdominal pain: Secondary | ICD-10-CM | POA: Diagnosis not present

## 2021-05-16 DIAGNOSIS — D126 Benign neoplasm of colon, unspecified: Secondary | ICD-10-CM | POA: Diagnosis not present

## 2021-05-16 DIAGNOSIS — K91850 Pouchitis: Secondary | ICD-10-CM | POA: Diagnosis not present

## 2021-05-16 DIAGNOSIS — Z6820 Body mass index (BMI) 20.0-20.9, adult: Secondary | ICD-10-CM | POA: Diagnosis not present

## 2021-05-16 MED ORDER — MIRTAZAPINE 15 MG TABLET
ORAL_TABLET | Freq: Every evening | ORAL | 3 refills | 90 days | Status: CP
Start: 2021-05-16 — End: 2021-08-14

## 2021-05-16 MED ORDER — HYOSCYAMINE ER 0.375 MG TABLET,EXTENDED RELEASE,12 HR
ORAL_TABLET | Freq: Two times a day (BID) | ORAL | 12 refills | 30 days | Status: CP | PRN
Start: 2021-05-16 — End: 2022-05-16

## 2021-05-17 DIAGNOSIS — R112 Nausea with vomiting, unspecified: Secondary | ICD-10-CM | POA: Diagnosis not present

## 2021-05-17 DIAGNOSIS — Z932 Ileostomy status: Secondary | ICD-10-CM | POA: Diagnosis not present

## 2021-05-17 DIAGNOSIS — E860 Dehydration: Secondary | ICD-10-CM | POA: Diagnosis not present

## 2021-05-17 DIAGNOSIS — R1084 Generalized abdominal pain: Secondary | ICD-10-CM | POA: Diagnosis not present

## 2021-05-20 ENCOUNTER — Ambulatory Visit
Admit: 2021-05-20 | Discharge: 2021-05-21 | Payer: PRIVATE HEALTH INSURANCE | Attending: Diagnostic Radiology | Primary: Diagnostic Radiology

## 2021-05-20 DIAGNOSIS — Z452 Encounter for adjustment and management of vascular access device: Principal | ICD-10-CM

## 2021-05-20 DIAGNOSIS — Q8789 Other specified congenital malformation syndromes, not elsewhere classified: Secondary | ICD-10-CM | POA: Diagnosis not present

## 2021-05-20 NOTE — Unmapped (Signed)
21 year old female arrives in [facility] for a tunneled line removal. Mother at bedside. Site and dressing C/D/I; no s/sx infection. Catheter removed w/o difficulty. Patient tolerated well. Covered site w/ gauze and secured with Tegaderm. Reviewed follow-up care instructions printed on AVS w/ patient and mother who verbalized understanding.

## 2021-05-24 DIAGNOSIS — M6281 Muscle weakness (generalized): Secondary | ICD-10-CM | POA: Diagnosis not present

## 2021-05-27 DIAGNOSIS — M6281 Muscle weakness (generalized): Secondary | ICD-10-CM | POA: Diagnosis not present

## 2021-05-31 DIAGNOSIS — M6281 Muscle weakness (generalized): Secondary | ICD-10-CM | POA: Diagnosis not present

## 2021-06-10 DIAGNOSIS — M6281 Muscle weakness (generalized): Secondary | ICD-10-CM | POA: Diagnosis not present

## 2021-06-17 DIAGNOSIS — M6281 Muscle weakness (generalized): Secondary | ICD-10-CM | POA: Diagnosis not present

## 2021-06-21 ENCOUNTER — Other Ambulatory Visit: Admit: 2021-06-21 | Discharge: 2021-06-21 | Payer: PRIVATE HEALTH INSURANCE

## 2021-06-21 ENCOUNTER — Ambulatory Visit: Admit: 2021-06-21 | Discharge: 2021-06-21 | Payer: PRIVATE HEALTH INSURANCE

## 2021-06-21 DIAGNOSIS — E43 Unspecified severe protein-calorie malnutrition: Principal | ICD-10-CM

## 2021-06-21 DIAGNOSIS — Z9049 Acquired absence of other specified parts of digestive tract: Principal | ICD-10-CM

## 2021-06-21 DIAGNOSIS — Q8789 Other specified congenital malformation syndromes, not elsewhere classified: Principal | ICD-10-CM

## 2021-06-21 DIAGNOSIS — I82A11 Acute embolism and thrombosis of right axillary vein: Principal | ICD-10-CM

## 2021-06-21 DIAGNOSIS — D500 Iron deficiency anemia secondary to blood loss (chronic): Principal | ICD-10-CM

## 2021-06-21 DIAGNOSIS — R52 Pain, unspecified: Principal | ICD-10-CM

## 2021-06-21 DIAGNOSIS — D481 Neoplasm of uncertain behavior of connective and other soft tissue: Principal | ICD-10-CM

## 2021-06-21 DIAGNOSIS — D841 Defects in the complement system: Secondary | ICD-10-CM | POA: Diagnosis not present

## 2021-06-21 DIAGNOSIS — N281 Cyst of kidney, acquired: Secondary | ICD-10-CM | POA: Diagnosis not present

## 2021-06-21 DIAGNOSIS — D692 Other nonthrombocytopenic purpura: Secondary | ICD-10-CM | POA: Diagnosis not present

## 2021-06-21 DIAGNOSIS — N854 Malposition of uterus: Secondary | ICD-10-CM | POA: Diagnosis not present

## 2021-06-21 DIAGNOSIS — K7689 Other specified diseases of liver: Secondary | ICD-10-CM | POA: Diagnosis not present

## 2021-06-22 DIAGNOSIS — Q8789 Other specified congenital malformation syndromes, not elsewhere classified: Principal | ICD-10-CM

## 2021-06-22 DIAGNOSIS — D481 Neoplasm of uncertain behavior of connective and other soft tissue: Principal | ICD-10-CM

## 2021-06-22 DIAGNOSIS — R413 Other amnesia: Principal | ICD-10-CM

## 2021-06-22 DIAGNOSIS — Z9049 Acquired absence of other specified parts of digestive tract: Principal | ICD-10-CM

## 2021-06-24 DIAGNOSIS — H5201 Hypermetropia, right eye: Secondary | ICD-10-CM | POA: Diagnosis not present

## 2021-06-24 DIAGNOSIS — H25041 Posterior subcapsular polar age-related cataract, right eye: Secondary | ICD-10-CM | POA: Diagnosis not present

## 2021-06-24 DIAGNOSIS — H04123 Dry eye syndrome of bilateral lacrimal glands: Secondary | ICD-10-CM | POA: Diagnosis not present

## 2021-06-28 ENCOUNTER — Ambulatory Visit: Admit: 2021-06-28 | Discharge: 2021-06-29 | Payer: PRIVATE HEALTH INSURANCE

## 2021-06-28 DIAGNOSIS — D500 Iron deficiency anemia secondary to blood loss (chronic): Principal | ICD-10-CM

## 2021-06-28 DIAGNOSIS — Z885 Allergy status to narcotic agent status: Secondary | ICD-10-CM | POA: Diagnosis not present

## 2021-06-28 DIAGNOSIS — Z792 Long term (current) use of antibiotics: Secondary | ICD-10-CM | POA: Diagnosis not present

## 2021-06-28 DIAGNOSIS — N939 Abnormal uterine and vaginal bleeding, unspecified: Secondary | ICD-10-CM | POA: Diagnosis not present

## 2021-06-28 DIAGNOSIS — Z9049 Acquired absence of other specified parts of digestive tract: Secondary | ICD-10-CM | POA: Diagnosis not present

## 2021-06-28 DIAGNOSIS — Z6820 Body mass index (BMI) 20.0-20.9, adult: Secondary | ICD-10-CM | POA: Diagnosis not present

## 2021-06-28 DIAGNOSIS — E43 Unspecified severe protein-calorie malnutrition: Secondary | ICD-10-CM | POA: Diagnosis not present

## 2021-06-28 DIAGNOSIS — R55 Syncope and collapse: Secondary | ICD-10-CM | POA: Diagnosis not present

## 2021-06-28 DIAGNOSIS — Z7901 Long term (current) use of anticoagulants: Secondary | ICD-10-CM | POA: Diagnosis not present

## 2021-06-28 DIAGNOSIS — Z881 Allergy status to other antibiotic agents status: Secondary | ICD-10-CM | POA: Diagnosis not present

## 2021-06-28 DIAGNOSIS — I82619 Acute embolism and thrombosis of superficial veins of unspecified upper extremity: Secondary | ICD-10-CM | POA: Diagnosis not present

## 2021-06-28 DIAGNOSIS — Z8616 Personal history of COVID-19: Secondary | ICD-10-CM | POA: Diagnosis not present

## 2021-06-28 DIAGNOSIS — R42 Dizziness and giddiness: Secondary | ICD-10-CM | POA: Diagnosis not present

## 2021-07-01 DIAGNOSIS — M6281 Muscle weakness (generalized): Secondary | ICD-10-CM | POA: Diagnosis not present

## 2021-07-02 ENCOUNTER — Telehealth: Admit: 2021-07-02 | Discharge: 2021-07-03 | Payer: PRIVATE HEALTH INSURANCE

## 2021-07-02 DIAGNOSIS — D481 Neoplasm of uncertain behavior of connective and other soft tissue: Principal | ICD-10-CM

## 2021-07-02 DIAGNOSIS — M79601 Pain in right arm: Principal | ICD-10-CM

## 2021-07-02 DIAGNOSIS — Z515 Encounter for palliative care: Principal | ICD-10-CM

## 2021-07-02 MED ORDER — OXYCODONE 5 MG TABLET
ORAL_TABLET | ORAL | 0 refills | 5 days | Status: CP | PRN
Start: 2021-07-02 — End: 2021-07-09

## 2021-07-08 DIAGNOSIS — M6281 Muscle weakness (generalized): Secondary | ICD-10-CM | POA: Diagnosis not present

## 2021-07-09 ENCOUNTER — Telehealth: Admit: 2021-07-09 | Discharge: 2021-07-10 | Payer: PRIVATE HEALTH INSURANCE

## 2021-07-09 DIAGNOSIS — Z515 Encounter for palliative care: Principal | ICD-10-CM

## 2021-07-09 DIAGNOSIS — R110 Nausea: Principal | ICD-10-CM

## 2021-07-09 DIAGNOSIS — D481 Neoplasm of uncertain behavior of connective and other soft tissue: Principal | ICD-10-CM

## 2021-07-09 DIAGNOSIS — M79601 Pain in right arm: Principal | ICD-10-CM

## 2021-07-09 MED ORDER — ONDANSETRON 8 MG DISINTEGRATING TABLET
ORAL_TABLET | Freq: Three times a day (TID) | ORAL | 3 refills | 30 days | Status: CP | PRN
Start: 2021-07-09 — End: 2021-10-07

## 2021-07-15 DIAGNOSIS — M6281 Muscle weakness (generalized): Secondary | ICD-10-CM | POA: Diagnosis not present

## 2021-07-16 ENCOUNTER — Ambulatory Visit: Admit: 2021-07-16 | Discharge: 2021-07-17 | Payer: PRIVATE HEALTH INSURANCE

## 2021-07-16 DIAGNOSIS — D481 Neoplasm of uncertain behavior of connective and other soft tissue: Principal | ICD-10-CM

## 2021-07-16 DIAGNOSIS — Z515 Encounter for palliative care: Principal | ICD-10-CM

## 2021-07-16 DIAGNOSIS — R110 Nausea: Principal | ICD-10-CM

## 2021-07-16 DIAGNOSIS — M958 Other specified acquired deformities of musculoskeletal system: Principal | ICD-10-CM

## 2021-07-16 DIAGNOSIS — M79601 Pain in right arm: Principal | ICD-10-CM

## 2021-07-16 DIAGNOSIS — F4310 Post-traumatic stress disorder, unspecified: Secondary | ICD-10-CM | POA: Diagnosis not present

## 2021-07-16 DIAGNOSIS — R52 Pain, unspecified: Secondary | ICD-10-CM | POA: Diagnosis not present

## 2021-07-16 DIAGNOSIS — F419 Anxiety disorder, unspecified: Secondary | ICD-10-CM | POA: Diagnosis not present

## 2021-07-16 DIAGNOSIS — Z885 Allergy status to narcotic agent status: Secondary | ICD-10-CM | POA: Diagnosis not present

## 2021-07-16 MED ORDER — OXYCODONE 5 MG TABLET
ORAL_TABLET | ORAL | 0 refills | 10 days | Status: CP | PRN
Start: 2021-07-16 — End: 2021-07-30

## 2021-07-17 DIAGNOSIS — M546 Pain in thoracic spine: Secondary | ICD-10-CM | POA: Diagnosis not present

## 2021-07-17 DIAGNOSIS — R978 Other abnormal tumor markers: Secondary | ICD-10-CM | POA: Diagnosis not present

## 2021-07-18 DIAGNOSIS — M958 Other specified acquired deformities of musculoskeletal system: Principal | ICD-10-CM

## 2021-07-19 DIAGNOSIS — G893 Neoplasm related pain (acute) (chronic): Principal | ICD-10-CM

## 2021-07-19 DIAGNOSIS — D485 Neoplasm of uncertain behavior of skin: Principal | ICD-10-CM

## 2021-07-19 DIAGNOSIS — D481 Neoplasm of uncertain behavior of connective and other soft tissue: Principal | ICD-10-CM

## 2021-07-19 DIAGNOSIS — R5381 Other malaise: Principal | ICD-10-CM

## 2021-07-22 DIAGNOSIS — G893 Neoplasm related pain (acute) (chronic): Principal | ICD-10-CM

## 2021-07-22 DIAGNOSIS — M25511 Pain in right shoulder: Principal | ICD-10-CM

## 2021-07-22 DIAGNOSIS — D481 Neoplasm of uncertain behavior of connective and other soft tissue: Principal | ICD-10-CM

## 2021-07-22 DIAGNOSIS — D485 Neoplasm of uncertain behavior of skin: Principal | ICD-10-CM

## 2021-07-22 DIAGNOSIS — R52 Pain, unspecified: Principal | ICD-10-CM

## 2021-07-23 DIAGNOSIS — M546 Pain in thoracic spine: Secondary | ICD-10-CM | POA: Diagnosis not present

## 2021-07-23 DIAGNOSIS — R978 Other abnormal tumor markers: Secondary | ICD-10-CM | POA: Diagnosis not present

## 2021-07-25 DIAGNOSIS — R978 Other abnormal tumor markers: Secondary | ICD-10-CM | POA: Diagnosis not present

## 2021-07-25 DIAGNOSIS — M546 Pain in thoracic spine: Secondary | ICD-10-CM | POA: Diagnosis not present

## 2021-07-29 DIAGNOSIS — R978 Other abnormal tumor markers: Secondary | ICD-10-CM | POA: Diagnosis not present

## 2021-07-29 DIAGNOSIS — M546 Pain in thoracic spine: Secondary | ICD-10-CM | POA: Diagnosis not present

## 2021-08-01 ENCOUNTER — Ambulatory Visit
Admit: 2021-08-01 | Discharge: 2021-08-02 | Payer: PRIVATE HEALTH INSURANCE | Attending: Gastroenterology | Primary: Gastroenterology

## 2021-08-01 DIAGNOSIS — G8929 Other chronic pain: Principal | ICD-10-CM

## 2021-08-01 DIAGNOSIS — R110 Nausea: Principal | ICD-10-CM

## 2021-08-01 DIAGNOSIS — K91850 Pouchitis: Principal | ICD-10-CM

## 2021-08-01 DIAGNOSIS — D126 Benign neoplasm of colon, unspecified: Principal | ICD-10-CM

## 2021-08-01 DIAGNOSIS — R109 Unspecified abdominal pain: Principal | ICD-10-CM

## 2021-08-01 MED ORDER — MIRTAZAPINE 7.5 MG TABLET
ORAL_TABLET | Freq: Every evening | ORAL | 3 refills | 90 days | Status: CP
Start: 2021-08-01 — End: 2022-08-01

## 2021-08-12 DIAGNOSIS — M546 Pain in thoracic spine: Secondary | ICD-10-CM | POA: Diagnosis not present

## 2021-08-12 DIAGNOSIS — R978 Other abnormal tumor markers: Secondary | ICD-10-CM | POA: Diagnosis not present

## 2021-08-13 ENCOUNTER — Ambulatory Visit: Admit: 2021-08-13 | Discharge: 2021-08-14 | Payer: PRIVATE HEALTH INSURANCE

## 2021-08-13 DIAGNOSIS — D481 Neoplasm of uncertain behavior of connective and other soft tissue: Principal | ICD-10-CM

## 2021-08-13 DIAGNOSIS — M958 Other specified acquired deformities of musculoskeletal system: Principal | ICD-10-CM

## 2021-08-13 DIAGNOSIS — M79601 Pain in right arm: Principal | ICD-10-CM

## 2021-08-13 DIAGNOSIS — R110 Nausea: Principal | ICD-10-CM

## 2021-08-13 DIAGNOSIS — Z515 Encounter for palliative care: Principal | ICD-10-CM

## 2021-08-13 MED ORDER — GABAPENTIN 100 MG CAPSULE
ORAL_CAPSULE | 3 refills | 0 days | Status: CP
Start: 2021-08-13 — End: ?

## 2021-08-13 MED ORDER — OXYCODONE 5 MG TABLET
ORAL_TABLET | 0 refills | 0 days | Status: CP
Start: 2021-08-13 — End: ?

## 2021-08-15 DIAGNOSIS — R978 Other abnormal tumor markers: Secondary | ICD-10-CM | POA: Diagnosis not present

## 2021-08-15 DIAGNOSIS — M546 Pain in thoracic spine: Secondary | ICD-10-CM | POA: Diagnosis not present

## 2021-08-24 ENCOUNTER — Ambulatory Visit: Admit: 2021-08-24 | Discharge: 2021-08-25 | Payer: PRIVATE HEALTH INSURANCE

## 2021-08-24 DIAGNOSIS — M958 Other specified acquired deformities of musculoskeletal system: Secondary | ICD-10-CM | POA: Diagnosis not present

## 2021-08-24 DIAGNOSIS — N854 Malposition of uterus: Secondary | ICD-10-CM | POA: Diagnosis not present

## 2021-08-24 DIAGNOSIS — K7689 Other specified diseases of liver: Secondary | ICD-10-CM | POA: Diagnosis not present

## 2021-08-24 DIAGNOSIS — D481 Neoplasm of uncertain behavior of connective and other soft tissue: Secondary | ICD-10-CM | POA: Diagnosis not present

## 2021-08-24 DIAGNOSIS — N281 Cyst of kidney, acquired: Secondary | ICD-10-CM | POA: Diagnosis not present

## 2021-08-26 DIAGNOSIS — Z980 Intestinal bypass and anastomosis status: Secondary | ICD-10-CM | POA: Diagnosis not present

## 2021-08-26 DIAGNOSIS — D126 Benign neoplasm of colon, unspecified: Secondary | ICD-10-CM | POA: Diagnosis not present

## 2021-08-26 DIAGNOSIS — K5900 Constipation, unspecified: Secondary | ICD-10-CM | POA: Diagnosis not present

## 2021-08-26 DIAGNOSIS — Z86010 Personal history of colonic polyps: Secondary | ICD-10-CM | POA: Diagnosis not present

## 2021-08-26 DIAGNOSIS — T402X5A Adverse effect of other opioids, initial encounter: Secondary | ICD-10-CM | POA: Diagnosis not present

## 2021-08-26 DIAGNOSIS — M25511 Pain in right shoulder: Secondary | ICD-10-CM | POA: Diagnosis not present

## 2021-08-26 DIAGNOSIS — Z79899 Other long term (current) drug therapy: Secondary | ICD-10-CM | POA: Diagnosis not present

## 2021-08-26 DIAGNOSIS — D692 Other nonthrombocytopenic purpura: Secondary | ICD-10-CM | POA: Diagnosis not present

## 2021-08-26 DIAGNOSIS — Z1289 Encounter for screening for malignant neoplasm of other sites: Secondary | ICD-10-CM | POA: Diagnosis not present

## 2021-08-26 DIAGNOSIS — R110 Nausea: Secondary | ICD-10-CM | POA: Diagnosis not present

## 2021-08-26 DIAGNOSIS — D481 Neoplasm of uncertain behavior of connective and other soft tissue: Secondary | ICD-10-CM | POA: Diagnosis not present

## 2021-08-26 DIAGNOSIS — K317 Polyp of stomach and duodenum: Secondary | ICD-10-CM | POA: Diagnosis not present

## 2021-08-26 DIAGNOSIS — G8929 Other chronic pain: Secondary | ICD-10-CM | POA: Diagnosis not present

## 2021-08-26 DIAGNOSIS — R109 Unspecified abdominal pain: Secondary | ICD-10-CM | POA: Diagnosis not present

## 2021-08-26 DIAGNOSIS — R1030 Lower abdominal pain, unspecified: Secondary | ICD-10-CM | POA: Diagnosis not present

## 2021-08-26 DIAGNOSIS — K561 Intussusception: Secondary | ICD-10-CM | POA: Diagnosis not present

## 2021-08-26 DIAGNOSIS — M546 Pain in thoracic spine: Secondary | ICD-10-CM | POA: Diagnosis not present

## 2021-08-26 DIAGNOSIS — D131 Benign neoplasm of stomach: Secondary | ICD-10-CM | POA: Diagnosis not present

## 2021-08-26 DIAGNOSIS — K6389 Other specified diseases of intestine: Secondary | ICD-10-CM | POA: Diagnosis not present

## 2021-08-26 DIAGNOSIS — K5903 Drug induced constipation: Secondary | ICD-10-CM | POA: Diagnosis not present

## 2021-08-26 DIAGNOSIS — R933 Abnormal findings on diagnostic imaging of other parts of digestive tract: Secondary | ICD-10-CM | POA: Diagnosis not present

## 2021-08-26 DIAGNOSIS — E43 Unspecified severe protein-calorie malnutrition: Secondary | ICD-10-CM | POA: Diagnosis not present

## 2021-08-26 DIAGNOSIS — K9189 Other postprocedural complications and disorders of digestive system: Secondary | ICD-10-CM | POA: Diagnosis not present

## 2021-08-26 DIAGNOSIS — R978 Other abnormal tumor markers: Secondary | ICD-10-CM | POA: Diagnosis not present

## 2021-08-26 DIAGNOSIS — Z9049 Acquired absence of other specified parts of digestive tract: Secondary | ICD-10-CM | POA: Diagnosis not present

## 2021-08-26 DIAGNOSIS — Z681 Body mass index (BMI) 19 or less, adult: Secondary | ICD-10-CM | POA: Diagnosis not present

## 2021-08-26 DIAGNOSIS — E876 Hypokalemia: Secondary | ICD-10-CM | POA: Diagnosis not present

## 2021-08-26 DIAGNOSIS — R1084 Generalized abdominal pain: Secondary | ICD-10-CM | POA: Diagnosis not present

## 2021-08-26 DIAGNOSIS — K5909 Other constipation: Secondary | ICD-10-CM | POA: Diagnosis not present

## 2021-08-26 DIAGNOSIS — M728 Other fibroblastic disorders: Secondary | ICD-10-CM | POA: Diagnosis not present

## 2021-08-26 DIAGNOSIS — K219 Gastro-esophageal reflux disease without esophagitis: Secondary | ICD-10-CM | POA: Diagnosis not present

## 2021-08-26 DIAGNOSIS — R630 Anorexia: Secondary | ICD-10-CM | POA: Diagnosis not present

## 2021-08-26 DIAGNOSIS — R112 Nausea with vomiting, unspecified: Secondary | ICD-10-CM | POA: Diagnosis not present

## 2021-08-26 DIAGNOSIS — Z8616 Personal history of COVID-19: Secondary | ICD-10-CM | POA: Diagnosis not present

## 2021-08-26 DIAGNOSIS — K5190 Ulcerative colitis, unspecified, without complications: Secondary | ICD-10-CM | POA: Diagnosis not present

## 2021-08-26 DIAGNOSIS — T451X5A Adverse effect of antineoplastic and immunosuppressive drugs, initial encounter: Secondary | ICD-10-CM | POA: Diagnosis not present

## 2021-08-26 DIAGNOSIS — R638 Other symptoms and signs concerning food and fluid intake: Secondary | ICD-10-CM | POA: Diagnosis not present

## 2021-08-26 DIAGNOSIS — Z515 Encounter for palliative care: Secondary | ICD-10-CM | POA: Diagnosis not present

## 2021-08-26 DIAGNOSIS — K567 Ileus, unspecified: Secondary | ICD-10-CM | POA: Diagnosis not present

## 2021-08-26 DIAGNOSIS — R140 Abdominal distension (gaseous): Secondary | ICD-10-CM | POA: Diagnosis not present

## 2021-08-27 ENCOUNTER — Ambulatory Visit: Admit: 2021-08-27 | Discharge: 2021-09-06 | Payer: PRIVATE HEALTH INSURANCE

## 2021-08-27 ENCOUNTER — Ambulatory Visit: Admit: 2021-08-27 | Payer: PRIVATE HEALTH INSURANCE

## 2021-08-27 ENCOUNTER — Ambulatory Visit
Admit: 2021-08-27 | Discharge: 2021-09-06 | Disposition: A | Discharge status: Home / Self Care | Payer: PRIVATE HEALTH INSURANCE | Admitting: Internal Medicine

## 2021-08-27 ENCOUNTER — Encounter: Admit: 2021-08-27 | Payer: PRIVATE HEALTH INSURANCE | Attending: Anesthesiology | Primary: Anesthesiology

## 2021-08-27 ENCOUNTER — Encounter
Admit: 2021-08-27 | Discharge: 2021-09-06 | Disposition: A | Discharge status: Home / Self Care | Payer: PRIVATE HEALTH INSURANCE | Admitting: Internal Medicine

## 2021-09-03 ENCOUNTER — Ambulatory Visit: Admit: 2021-09-03 | Discharge: 2021-09-03 | Payer: PRIVATE HEALTH INSURANCE

## 2021-09-04 MED ORDER — METOCLOPRAMIDE 5 MG/5 ML ORAL SOLUTION
Freq: Three times a day (TID) | ORAL | 0 refills | 16 days | Status: CP
Start: 2021-09-04 — End: 2021-10-04
  Filled 2021-09-06: qty 120, 16d supply, fill #0

## 2021-09-05 MED ORDER — HYOSCYAMINE ER 0.375 MG TABLET,EXTENDED RELEASE,12 HR
ORAL_TABLET | Freq: Two times a day (BID) | ORAL | 0 refills | 15 days | Status: CP | PRN
Start: 2021-09-05 — End: 2021-10-05

## 2021-09-05 MED ORDER — DICLOFENAC 1 % TOPICAL GEL
Freq: Four times a day (QID) | TOPICAL | 0 refills | 13 days | Status: CP
Start: 2021-09-05 — End: 2022-09-05

## 2021-09-06 MED ORDER — OXYCODONE 5 MG TABLET
ORAL_TABLET | Freq: Three times a day (TID) | ORAL | 0 refills | 7 days | Status: CP | PRN
Start: 2021-09-06 — End: 2021-09-13
  Filled 2021-09-06: qty 20, 7d supply, fill #0

## 2021-09-06 MED ORDER — LIDOCAINE 4 % TOPICAL PATCH
MEDICATED_PATCH | TRANSDERMAL | 0 refills | 30 days | Status: CP
Start: 2021-09-06 — End: 2021-10-06
  Filled 2021-09-06: qty 10, 10d supply, fill #0

## 2021-09-17 DIAGNOSIS — D481 Neoplasm of uncertain behavior of connective and other soft tissue: Principal | ICD-10-CM

## 2021-09-17 MED ORDER — OXYCODONE 5 MG TABLET
ORAL_TABLET | ORAL | 0 refills | 9 days | Status: CP | PRN
Start: 2021-09-17 — End: 2021-10-02

## 2021-09-19 DIAGNOSIS — R978 Other abnormal tumor markers: Secondary | ICD-10-CM | POA: Diagnosis not present

## 2021-09-19 DIAGNOSIS — M546 Pain in thoracic spine: Secondary | ICD-10-CM | POA: Diagnosis not present

## 2021-09-26 DIAGNOSIS — R978 Other abnormal tumor markers: Secondary | ICD-10-CM | POA: Diagnosis not present

## 2021-09-26 DIAGNOSIS — M546 Pain in thoracic spine: Secondary | ICD-10-CM | POA: Diagnosis not present

## 2021-10-02 ENCOUNTER — Ambulatory Visit
Admit: 2021-10-02 | Discharge: 2021-10-02 | Payer: PRIVATE HEALTH INSURANCE | Attending: Student in an Organized Health Care Education/Training Program | Primary: Student in an Organized Health Care Education/Training Program

## 2021-10-02 ENCOUNTER — Ambulatory Visit
Admit: 2021-10-02 | Discharge: 2021-10-02 | Payer: PRIVATE HEALTH INSURANCE | Attending: Gastroenterology | Primary: Gastroenterology

## 2021-10-02 DIAGNOSIS — R197 Diarrhea, unspecified: Principal | ICD-10-CM

## 2021-10-02 DIAGNOSIS — M7918 Myalgia, other site: Principal | ICD-10-CM

## 2021-10-02 DIAGNOSIS — M25511 Pain in right shoulder: Principal | ICD-10-CM

## 2021-10-02 DIAGNOSIS — R208 Other disturbances of skin sensation: Principal | ICD-10-CM

## 2021-10-02 DIAGNOSIS — D485 Neoplasm of uncertain behavior of skin: Principal | ICD-10-CM

## 2021-10-02 DIAGNOSIS — D481 Neoplasm of uncertain behavior of connective and other soft tissue: Principal | ICD-10-CM

## 2021-10-02 DIAGNOSIS — R1084 Generalized abdominal pain: Principal | ICD-10-CM

## 2021-10-02 DIAGNOSIS — G893 Neoplasm related pain (acute) (chronic): Principal | ICD-10-CM

## 2021-10-02 DIAGNOSIS — K91850 Pouchitis: Principal | ICD-10-CM

## 2021-10-02 DIAGNOSIS — Z885 Allergy status to narcotic agent status: Secondary | ICD-10-CM | POA: Diagnosis not present

## 2021-10-02 DIAGNOSIS — E43 Unspecified severe protein-calorie malnutrition: Secondary | ICD-10-CM | POA: Diagnosis not present

## 2021-10-02 DIAGNOSIS — M7912 Myalgia of auxiliary muscles, head and neck: Secondary | ICD-10-CM | POA: Diagnosis not present

## 2021-10-02 DIAGNOSIS — Z9049 Acquired absence of other specified parts of digestive tract: Secondary | ICD-10-CM | POA: Diagnosis not present

## 2021-10-02 DIAGNOSIS — M79601 Pain in right arm: Secondary | ICD-10-CM | POA: Diagnosis not present

## 2021-10-02 DIAGNOSIS — R140 Abdominal distension (gaseous): Secondary | ICD-10-CM | POA: Diagnosis not present

## 2021-10-02 DIAGNOSIS — D649 Anemia, unspecified: Secondary | ICD-10-CM | POA: Diagnosis not present

## 2021-10-02 DIAGNOSIS — R110 Nausea: Secondary | ICD-10-CM | POA: Diagnosis not present

## 2021-10-02 DIAGNOSIS — K625 Hemorrhage of anus and rectum: Secondary | ICD-10-CM | POA: Diagnosis not present

## 2021-10-02 MED ORDER — FAMOTIDINE 20 MG TABLET
ORAL_TABLET | Freq: Every evening | ORAL | 3 refills | 90 days | Status: CP
Start: 2021-10-02 — End: 2022-10-02

## 2021-10-02 MED ORDER — AMITRIPTYLINE 10 MG TABLET
ORAL_TABLET | Freq: Every evening | ORAL | 2 refills | 30 days | Status: CP
Start: 2021-10-02 — End: 2021-12-31

## 2021-10-03 DIAGNOSIS — M546 Pain in thoracic spine: Secondary | ICD-10-CM | POA: Diagnosis not present

## 2021-10-03 DIAGNOSIS — R978 Other abnormal tumor markers: Secondary | ICD-10-CM | POA: Diagnosis not present

## 2021-10-09 DIAGNOSIS — D481 Neoplasm of uncertain behavior of connective and other soft tissue: Principal | ICD-10-CM

## 2021-10-09 MED ORDER — OXYCODONE 5 MG TABLET
ORAL_TABLET | ORAL | 0 refills | 9 days | Status: CP | PRN
Start: 2021-10-09 — End: 2021-10-24

## 2021-10-16 ENCOUNTER — Ambulatory Visit
Admit: 2021-10-16 | Discharge: 2021-10-17 | Payer: PRIVATE HEALTH INSURANCE | Attending: Student in an Organized Health Care Education/Training Program | Primary: Student in an Organized Health Care Education/Training Program

## 2021-10-16 DIAGNOSIS — M7918 Myalgia, other site: Principal | ICD-10-CM

## 2021-10-16 DIAGNOSIS — M25511 Pain in right shoulder: Principal | ICD-10-CM

## 2021-10-21 ENCOUNTER — Encounter (HOSPITAL_BASED_OUTPATIENT_CLINIC_OR_DEPARTMENT_OTHER): Payer: Self-pay | Admitting: Emergency Medicine

## 2021-10-21 ENCOUNTER — Other Ambulatory Visit: Payer: Self-pay

## 2021-10-21 ENCOUNTER — Emergency Department (HOSPITAL_BASED_OUTPATIENT_CLINIC_OR_DEPARTMENT_OTHER)
Admission: EM | Admit: 2021-10-21 | Discharge: 2021-10-21 | Disposition: A | Discharge status: Home / Self Care | Payer: BC Managed Care – PPO | Attending: Emergency Medicine | Admitting: Emergency Medicine

## 2021-10-21 DIAGNOSIS — M79601 Pain in right arm: Secondary | ICD-10-CM

## 2021-10-21 LAB — COMPREHENSIVE METABOLIC PANEL
ALT: 7 U/L (ref 0–44)
AST: 13 U/L — ABNORMAL LOW (ref 15–41)
Albumin: 4.3 g/dL (ref 3.5–5.0)
Alkaline Phosphatase: 35 U/L — ABNORMAL LOW (ref 38–126)
Anion gap: 7 (ref 5–15)
BUN: 11 mg/dL (ref 6–20)
CO2: 26 mmol/L (ref 22–32)
Calcium: 9.2 mg/dL (ref 8.9–10.3)
Chloride: 105 mmol/L (ref 98–111)
Creatinine, Ser: 0.83 mg/dL (ref 0.44–1.00)
GFR, Estimated: >60 mL/min (ref >60)
Glucose, Bld: 101 mg/dL — ABNORMAL HIGH (ref 70–99)
Potassium: 3.9 mmol/L (ref 3.5–5.1)
Sodium: 138 mmol/L (ref 135–145)
Total Bilirubin: 0.7 mg/dL (ref 0.3–1.2)
Total Protein: 6.7 g/dL (ref 6.5–8.1)

## 2021-10-21 LAB — CBC
HCT: 40.4 % (ref 36.0–46.0)
Hemoglobin: 13.5 g/dL (ref 12.0–15.0)
MCH: 29.5 pg (ref 26.0–34.0)
MCHC: 33.4 g/dL (ref 30.0–36.0)
MCV: 88.2 fL (ref 80.0–100.0)
Platelets: 236 10*3/uL (ref 150–400)
RBC: 4.58 MIL/uL (ref 3.87–5.11)
RDW: 12 % (ref 11.5–15.5)
WBC: 6.5 10*3/uL (ref 4.0–10.5)
nRBC: 0 % (ref 0.0–0.2)

## 2021-10-21 MED ORDER — RIVAROXABAN 15 MG PO TABS
15.0000 mg | ORAL_TABLET | Freq: Once | ORAL | Status: AC
Start: 2021-10-21 — End: 2021-10-21
  Administered 2021-10-21: 15 mg via ORAL
  Filled 2021-10-21: qty 1

## 2021-10-21 NOTE — ED Provider Notes (Signed)
[place] EMERGENCY DEPT Provider Note   CSN: [id number] Arrival date & time: 10/21/21  1629     History  Chief Complaint  Patient presents with   Arm Pain    Lisa Donovan is a 21 y.o. female.  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  She denies chest pain, shortness of breath.  She states the pain feels similar to her prior DVTs.  She states since her prior DVTs she was told that this has scarred her vein as she reports intermittent pain in this region but never this bad.  States towards the end of last year she also had GI bleed and when she was put on anticoagulation she was put on half dose.  She denies any known injury to her right arm.  She does have intact range of motion.  The history is provided by the patient. No language interpreter was used.       Home Medications Prior to Admission medications   Medication Sig Start Date End Date Taking? Authorizing Provider  acetaminophen (TYLENOL) 500 MG tablet Take 500 mg by mouth every 6 (six) hours as needed for moderate pain.    [provider]  blood glucose meter kit and supplies KIT Dispense based on patient and insurance preference. Use up to four times daily as directed. 03/02/21   Raiford Noble Latif, DO  cefdinir (OMNICEF) 300 MG capsule Take 300 mg by mouth 2 (two) times daily. Continuously 04/08/21   [provider]  dicyclomine (BENTYL) 10 MG capsule Take 1 capsule (10 mg total) by mouth 4 (four) times daily -  before meals and at bedtime. Patient not taking: Reported on 05/14/2021 03/02/21 04/01/21  Raiford Noble Latif, DO  diphenhydrAMINE (BENADRYL) 25 MG tablet Take 1 tablet (25 mg total) by mouth every 6 (six)  hours. 05/14/21   Maudie Flakes, MD  ELIQUIS 2.5 MG TABS tablet Take 2.5 mg by mouth 2 (two) times daily. 05/03/21   [provider]  famotidine (PEPCID) 20 MG tablet Take 1 tablet (20 mg total) by mouth 2 (two) times daily. 05/14/21   Maudie Flakes, MD  feeding supplement (ENSURE ENLIVE / ENSURE PLUS) LIQD Take 237 mLs by mouth 3 (three) times daily with meals. Patient not taking: Reported on 05/14/2021 03/02/21   Raiford Noble Latif, DO  furosemide (LASIX) 20 MG tablet Take 20 mg by mouth daily as needed for fluid. 04/18/21   [provider]  hydrocortisone (ANUSOL-HC) 2.5 % rectal cream Place rectally 3 (three) times daily. Patient taking differently: Place 1 application. rectally 3 (three) times daily as needed for hemorrhoids or anal itching. 03/02/21   Raiford Noble Latif, DO  hyoscyamine (LEVSIN SL) 0.125 MG SL tablet Take 0.125 mg by mouth every 6 (six) hours as needed for cramping. Patient not taking: Reported on 05/14/2021 05/16/20   [provider]  mirtazapine (REMERON) 15 MG tablet Take 15 mg by mouth at bedtime. 05/09/21   [provider]  Multiple Vitamin (MULTIVITAMIN WITH MINERALS) TABS tablet Take 1 tablet by mouth daily. Patient not taking: Reported on 05/14/2021 03/03/21   Raiford Noble Latif, DO  omeprazole (PRILOSEC OTC) 20 MG tablet Take 20 mg by mouth daily as needed (heartburn).    [provider]  ondansetron (ZOFRAN-ODT) 4 MG disintegrating tablet Take 1 tablet (4 mg total) by mouth every 8 (eight) hours as needed for nausea or vomiting. 12/12/20   Inda Coke, PA  oxyCODONE (OXY IR/ROXICODONE) 5 MG immediate release tablet Take 5 mg by mouth 2 (two) times daily as needed for moderate pain. 03/14/21   [provider]  pantoprazole (PROTONIX) 40 MG tablet Take 1 tablet (40 mg total) by mouth 2 (two) times daily. Patient not taking: Reported on 05/14/2021 03/02/21   Raiford Noble Latif, DO  traMADol (ULTRAM) 50 MG tablet Take 50 mg  by mouth 3 (three) times daily as needed for moderate pain. Patient not taking: Reported on 05/14/2021 01/23/21   [provider]  Zinc Oxide (DESITIN EX) Apply 1 application. topically 2 (two) times daily as needed (irriation).    [provider]      Allergies    Compazine [prochlorperazine], Papaya, Morphine and related, Zosyn [piperacillin-tazobactam in dex], Ferrous sulfate, Latex, Neomycin, and Tape    Review of Systems   Review of Systems  Constitutional:  Negative for chills and fever.  Respiratory:  Negative for shortness of breath.   Cardiovascular:  Negative for chest pain, palpitations and leg swelling.  Gastrointestinal:  Negative for abdominal pain.  Musculoskeletal:  Positive for myalgias.  All other systems reviewed and are negative.   Physical Exam Updated Vital Signs BP 112/71 (BP Location: Left Arm)   Pulse 64   Temp 98.1 F (36.7 C)   Resp 18   Ht 5' 5"  (1.651 m)   Wt 51.7 kg   LMP 10/16/2021   SpO2 100%   BMI 18.97 kg/m  Physical Exam Vitals and nursing note reviewed.  Constitutional:      General: She is not in acute distress.    Appearance: Normal appearance. She is not ill-appearing.  HENT:     Head: Normocephalic and atraumatic.     Nose: Nose normal.  Eyes:     General: No scleral icterus.    Extraocular Movements: Extraocular movements intact.     Conjunctiva/sclera: Conjunctivae normal.  Cardiovascular:     Rate and Rhythm: Normal rate and regular rhythm.     Pulses: Normal pulses.  Pulmonary:     Effort: Pulmonary effort is normal. No respiratory distress.     Breath sounds: Normal breath sounds. No wheezing or rales.  Musculoskeletal:        General: No deformity. Normal range of motion.     Cervical back: Normal range of motion.     Comments: Full range of motion of the right upper extremity including the shoulder, elbow, wrist.  2+ radial and ulnar pulses present.  Forage motion in all digits of the right hand.   Sensation intact.  Skin:    General: Skin is warm and dry.     Findings: No rash.  Neurological:     General: No focal deficit present.     Mental Status: She is alert. Mental status is at baseline.     ED Results / Procedures / Treatments   Labs (all labs ordered are listed, but only abnormal results are displayed) Labs Reviewed - No data to display  EKG None  Radiology No results found.  Procedures Procedures    Medications Ordered in ED Medications - No data to display  ED Course/ Medical Decision Making/ A&P  Medical Decision Making Amount and/or Complexity of Data Reviewed Labs: ordered.   Medical Decision Making / ED Course   This patient presents to the ED for concern of right arm pain, this involves an extensive number of treatment options, and is a complaint that carries with it a high risk of complications and morbidity.  The differential diagnosis includes DVT, MSK pain, chronic pain from previous DVTs  MDM: 21 year old female presents with her mom and boyfriend who are at bedside for evaluation of right arm pain that has been ongoing since her recent trigger point injections.  She states initially she had some swelling in the arm following the procedure and some pain.  The swelling is improved but the pain has been getting worse.  She states this is consistent with her previous pain that she has had with her DVTs in the right upper extremity.  States she typically has intermittent pain from her prior DVTs despite completing her anticoagulation therapy however it has never been this bad or lasted this long.  Denies any injury to her arm.  Mom also states patient has lost about 2-1/2 pounds over the past week and a half.  She requests while blood work is being drawn to also have her protein levels checked (albumin).  Ultrasound is not available until tomorrow morning.  Will obtain basic labs and if stable will provide first dose of  anticoagulation and have patient return for outpatient right upper extremity DVT study.  Given mom request we will order CMP instead of BMP. CBC without leukocytosis or anemia.  CMP shows preserved renal function, normal electrolytes.  No acute concerns. Given patient's history of GI bleed extensive discussion had regarding providing dose of Xarelto versus waiting for DVT study prior to initiating treatment.  Mom and patient both concerned given her symptoms are consistent with prior DVT that they would like to cover empirically for DVT and they will have the study done tomorrow.  Will provide dose of Xarelto.  Outpatient DVT study ordered.  Exam otherwise reassuring.  No joint tenderness to palpation or limitation in range of motion is present.  No overlying cellulitis or other skin changes.  Neurovascularly intact.   Additional history obtained: -Additional history obtained from recent heme/onc visit confirming patient's previous medical history, prior DVTs.  -External records from outside source obtained and reviewed including: Chart review including previous notes, labs, imaging, consultation notes   Lab Tests: -I ordered, reviewed, and interpreted labs.   The pertinent results include:   Labs Reviewed - No data to display    EKG  EKG Interpretation  Date/Time:    Ventricular Rate:    PR Interval:    QRS Duration:   QT Interval:    QTC Calculation:   R Axis:     Text Interpretation:         Medicines ordered and prescription drug management: No orders of the defined types were placed in this encounter.   -I have reviewed the patients home medicines and have made adjustments as needed  Reevaluation: After the interventions noted above, I reevaluated the patient and found that they have :stayed the same  Co morbidities that complicate the patient evaluation  Past Medical History:  Diagnosis Date   Anemia    Colon polyps    Dx at age 73, has colonoscopy yearly   Desmoid  tumor    [name]    Dx at age 64   GERD (gastroesophageal reflux disease)    H/O colonoscopy  Pt has has them yearly   History of endoscopy    Ileus ([place])       Dispostion: Patient stable for discharge.  Discharged in stable condition.  DVT ultrasound study for right upper extremity scheduled as outpatient for patient return tomorrow to have done.  Dose of Xarelto given in ED today.  Final Clinical Impression(s) / ED Diagnoses Final diagnoses:  Right arm pain    Rx / DC Orders ED Discharge Orders          Ordered    US Venous Img Upper Uni Right        10/21/21 1830              Evlyn Courier, PA-C 10/21/21 [place], [place], DO 10/21/21 1843

## 2021-10-21 NOTE — Discharge Instructions (Addendum)
Your blood work today was reassuring.  No concern for anemia.  Kidney function and proteins were normal.  Given we do not have ultrasound at this time today I have placed an outpatient DVT study ordered for you.  You can return tomorrow to have this done.  You received your first dose of Xarelto in the emergency room.  If this is positive this can be prescribed for you tomorrow.  Otherwise you have good range of motion and exam of the right arm.  If the ultrasound does not show any evidence of DVT I recommend you follow-up with your PCP for further work-up of your right arm pain.  Ultrasound will be here almost all day tomorrow.  I recommend you come in sometime in the morning to have this done.  Let the front desk know that you are checking in for the ultrasound.

## 2021-10-21 NOTE — ED Triage Notes (Signed)
Pt had trigger point injections last week. Since yesterday, increased pain in right arm. Hx DVT in arm from PICC line. Pt normally seen at [facility].

## 2021-10-22 ENCOUNTER — Ambulatory Visit (HOSPITAL_BASED_OUTPATIENT_CLINIC_OR_DEPARTMENT_OTHER)
Admission: RE | Admit: 2021-10-22 | Discharge: 2021-10-22 | Disposition: A | Discharge status: Home / Self Care | Payer: BC Managed Care – PPO | Source: Ambulatory Visit | Attending: Emergency Medicine | Admitting: Emergency Medicine

## 2021-10-22 ENCOUNTER — Ambulatory Visit: Payer: BC Managed Care – PPO | Admitting: Physician Assistant

## 2021-10-22 ENCOUNTER — Other Ambulatory Visit: Payer: Self-pay

## 2021-10-22 DIAGNOSIS — M79601 Pain in right arm: Secondary | ICD-10-CM | POA: Diagnosis not present

## 2021-10-22 DIAGNOSIS — M25511 Pain in right shoulder: Secondary | ICD-10-CM | POA: Diagnosis not present

## 2021-10-23 ENCOUNTER — Ambulatory Visit
Admit: 2021-10-23 | Discharge: 2021-10-24 | Payer: PRIVATE HEALTH INSURANCE | Attending: Student in an Organized Health Care Education/Training Program | Primary: Student in an Organized Health Care Education/Training Program

## 2021-10-23 DIAGNOSIS — Q8789 Other specified congenital malformation syndromes, not elsewhere classified: Principal | ICD-10-CM

## 2021-10-23 DIAGNOSIS — G893 Neoplasm related pain (acute) (chronic): Principal | ICD-10-CM

## 2021-10-23 DIAGNOSIS — D481 Neoplasm of uncertain behavior of connective and other soft tissue: Principal | ICD-10-CM

## 2021-10-23 DIAGNOSIS — R1084 Generalized abdominal pain: Principal | ICD-10-CM

## 2021-10-23 DIAGNOSIS — M7918 Myalgia, other site: Principal | ICD-10-CM

## 2021-10-23 DIAGNOSIS — R208 Other disturbances of skin sensation: Principal | ICD-10-CM

## 2021-10-23 DIAGNOSIS — G894 Chronic pain syndrome: Principal | ICD-10-CM

## 2021-10-23 DIAGNOSIS — M25511 Pain in right shoulder: Principal | ICD-10-CM

## 2021-10-23 MED ORDER — DULOXETINE 20 MG CAPSULE,DELAYED RELEASE
ORAL_CAPSULE | Freq: Every day | ORAL | 0 refills | 30 days | Status: CP
Start: 2021-10-23 — End: 2021-11-22

## 2021-10-23 MED ORDER — BACLOFEN 5 MG TABLET
ORAL_TABLET | Freq: Two times a day (BID) | ORAL | 0 refills | 10 days | Status: CP | PRN
Start: 2021-10-23 — End: ?

## 2021-11-01 MED ORDER — OXYCODONE 5 MG TABLET
ORAL_TABLET | ORAL | 0 refills | 9 days | Status: CP | PRN
Start: 2021-11-01 — End: 2021-12-01

## 2021-11-07 ENCOUNTER — Telehealth
Admit: 2021-11-07 | Discharge: 2021-11-08 | Payer: PRIVATE HEALTH INSURANCE | Attending: Student in an Organized Health Care Education/Training Program | Primary: Student in an Organized Health Care Education/Training Program

## 2021-11-07 DIAGNOSIS — F411 Generalized anxiety disorder: Secondary | ICD-10-CM | POA: Diagnosis not present

## 2021-11-12 ENCOUNTER — Ambulatory Visit: Payer: BC Managed Care – PPO | Admitting: Physician Assistant

## 2021-11-15 ENCOUNTER — Encounter: Payer: Self-pay | Admitting: Physician Assistant

## 2021-11-15 ENCOUNTER — Ambulatory Visit (INDEPENDENT_AMBULATORY_CARE_PROVIDER_SITE_OTHER): Payer: BC Managed Care – PPO | Admitting: Physician Assistant

## 2021-11-15 VITALS — BP 111/72 | HR 97 | Temp 98.5°F | Ht 65.0 in | Wt 115.0 lb

## 2021-11-15 DIAGNOSIS — Z111 Encounter for screening for respiratory tuberculosis: Secondary | ICD-10-CM | POA: Diagnosis not present

## 2021-11-15 DIAGNOSIS — F32A Depression, unspecified: Secondary | ICD-10-CM

## 2021-11-15 DIAGNOSIS — F419 Anxiety disorder, unspecified: Secondary | ICD-10-CM

## 2021-11-15 DIAGNOSIS — G8929 Other chronic pain: Secondary | ICD-10-CM | POA: Diagnosis not present

## 2021-11-15 MED ORDER — ONDANSETRON 8 MG PO TBDP: 8.0000 mg | ORAL_TABLET | Freq: Three times a day (TID) | ORAL | 3 refills | Status: AC | PRN

## 2021-11-15 NOTE — Patient Instructions (Signed)
It was great to see you!  TB blood test to be done today.  I'm glad you are doing well -- good luck with nursing school!!  Take care,  Inda Coke PA-C

## 2021-11-15 NOTE — Progress Notes (Signed)
Lisa Donovan is a 21 y.o. female here for a follow up of a pre-existing problem.  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 Denies SI/HI  Needs TB test for school  Past Medical History:  Diagnosis Date   Anemia    Colon polyps    Dx at age 63, has colonoscopy yearly   [name]    [name]    Dx at age 70   GERD (gastroesophageal reflux disease)    H/O colonoscopy    Pt has has them yearly   History of endoscopy    Ileus ([place])      Social History   Tobacco Use   Smoking status: Never   Smokeless tobacco: Never  Vaping Use   Vaping Use: Never used  Substance Use Topics   Alcohol use: Never   Drug use: Never    Past Surgical History:  Procedure Laterality Date   APPENDECTOMY     COLOPROCTECTOMY W/ ILEO J POUCH     EVALUATION UNDER ANESTHESIA WITH ANAL FISTULECTOMY N/A 02/21/2021   Procedure: EXAM UNDER ANESTHESIA WITH ANAL FISTULECTOMY;  Surgeon: Erroll Luna, MD;  Location: [facility];  Service: General;  Laterality: N/A;   Oral surgery     Tumor removed     Numerous surgies since she was 18 months old    History reviewed. No pertinent family history.  Allergies  Allergen Reactions   Compazine [Prochlorperazine] Other (See Comments)    agitation   Papaya Hives   Morphine And Related Nausea And Vomiting   Zosyn [Piperacillin-Tazobactam In Dex] Itching, Dermatitis and Rash   Ferrous Sulfate  Swelling and Rash   Latex Rash   Neomycin Swelling and Rash   Tape Rash    Prefers paper tapes     Current Medications:   Current Outpatient Medications:    acetaminophen (TYLENOL) 500 MG tablet, Take 500 mg by mouth every 6 (six) hours as needed for moderate pain., Disp: , Rfl:    Baclofen 5 MG TABS, Take 1-2 tablets by mouth 2 (two) times daily as needed., Disp: , Rfl:    cefdinir (OMNICEF) 300 MG capsule, Take 300 mg by mouth 2 (two) times daily. Continuously, Disp: , Rfl:    diclofenac Sodium (VOLTAREN) 1 % GEL, Apply topically., Disp: , Rfl:    DULoxetine (CYMBALTA) 20 MG capsule, Take 1 capsule by mouth daily., Disp: , Rfl:    famotidine (PEPCID) 20 MG tablet, Take 1 tablet (20 mg total) by mouth 2 (two) times daily., Disp: 30 tablet, Rfl: 0   gabapentin (NEURONTIN) 100 MG capsule, Take '100mg'$  at bedtime, may increase in '100mg'$  increments to '300mg'$  nightly and '100mg'$  in the morning., Disp: , Rfl:    hydrocortisone (ANUSOL-HC) 2.5 % rectal cream, Place rectally 3 (three) times daily. (Patient taking differently: Place 1 application  rectally 3 (three) times daily as needed for hemorrhoids or anal itching.), Disp: 30 g, Rfl: 0  ondansetron (ZOFRAN-ODT) 4 MG disintegrating tablet, Take 1 tablet (4 mg total) by mouth every 8 (eight) hours as needed for nausea or vomiting., Disp: 60 tablet, Rfl: 1   oxyCODONE (OXY IR/ROXICODONE) 5 MG immediate release tablet, Take 5 mg by mouth 2 (two) times daily as needed for moderate pain., Disp: , Rfl:    Zinc Oxide (DESITIN EX), Apply 1 application. topically 2 (two) times daily as needed (irriation)., Disp: , Rfl:    Review of Systems:   ROS Negative unless otherwise specified per HPI.  Vitals:   Vitals:   11/15/21 1323  BP: 111/72  Pulse: 97  Temp: 98.5 F (36.9 C)  SpO2: 98%  Weight: 115 lb (52.2 kg)  Height: '5\' 5"'$  (1.651 m)     Body mass index is 19.14 kg/m.  Physical Exam:   Physical Exam Vitals and nursing note reviewed.   Constitutional:      General: She is not in acute distress.    Appearance: She is well-developed. She is not ill-appearing or toxic-appearing.  Cardiovascular:     Rate and Rhythm: Normal rate and regular rhythm.     Pulses: Normal pulses.     Heart sounds: Normal heart sounds, S1 normal and S2 normal.  Pulmonary:     Effort: Pulmonary effort is normal.     Breath sounds: Normal breath sounds.  Skin:    General: Skin is warm and dry.  Neurological:     Mental Status: She is alert.     GCS: GCS eye subscore is 4. GCS verbal subscore is 5. GCS motor subscore is 6.  Psychiatric:        Speech: Speech normal.        Behavior: Behavior normal. Behavior is cooperative.     Assessment and Plan:   Anxiety and depression Overall controlled Continue talk therapy Cymbalta providing some benefit Follow-up as needed  Other chronic pain Well controlled per patient  Screening-pulmonary TB TB test done today  Inda Coke, PA-C

## 2021-11-17 LAB — QUANTIFERON-TB GOLD PLUS
Mitogen-NIL: >10 IU/mL
NIL: 0.12 IU/mL
QuantiFERON-TB Gold Plus: NEGATIVE
TB1-NIL: 0.02 IU/mL
TB2-NIL: 0.05 IU/mL

## 2021-11-21 ENCOUNTER — Ambulatory Visit: Admit: 2021-11-21 | Discharge: 2021-11-21 | Payer: PRIVATE HEALTH INSURANCE

## 2021-11-21 DIAGNOSIS — Z9889 Other specified postprocedural states: Secondary | ICD-10-CM | POA: Diagnosis not present

## 2021-11-21 DIAGNOSIS — D481 Neoplasm of uncertain behavior of connective and other soft tissue: Secondary | ICD-10-CM | POA: Diagnosis not present

## 2021-11-21 DIAGNOSIS — E43 Unspecified severe protein-calorie malnutrition: Secondary | ICD-10-CM | POA: Diagnosis not present

## 2021-11-21 DIAGNOSIS — D500 Iron deficiency anemia secondary to blood loss (chronic): Secondary | ICD-10-CM | POA: Diagnosis not present

## 2021-11-22 MED ORDER — OXYCODONE 5 MG TABLET
ORAL_TABLET | ORAL | 0 refills | 9 days | Status: CP | PRN
Start: 2021-11-22 — End: 2021-12-22

## 2021-11-25 DIAGNOSIS — G894 Chronic pain syndrome: Principal | ICD-10-CM

## 2021-11-25 DIAGNOSIS — R1084 Generalized abdominal pain: Principal | ICD-10-CM

## 2021-11-25 DIAGNOSIS — R208 Other disturbances of skin sensation: Principal | ICD-10-CM

## 2021-11-25 DIAGNOSIS — M7918 Myalgia, other site: Principal | ICD-10-CM

## 2021-11-25 DIAGNOSIS — M25511 Pain in right shoulder: Principal | ICD-10-CM

## 2021-11-25 DIAGNOSIS — G893 Neoplasm related pain (acute) (chronic): Principal | ICD-10-CM

## 2021-11-25 MED ORDER — DULOXETINE 20 MG CAPSULE,DELAYED RELEASE
ORAL_CAPSULE | Freq: Every day | ORAL | 0 refills | 30 days
Start: 2021-11-25 — End: 2021-12-25

## 2021-11-26 DIAGNOSIS — M25511 Pain in right shoulder: Principal | ICD-10-CM

## 2021-11-26 DIAGNOSIS — M7918 Myalgia, other site: Principal | ICD-10-CM

## 2021-11-26 DIAGNOSIS — G893 Neoplasm related pain (acute) (chronic): Principal | ICD-10-CM

## 2021-11-26 DIAGNOSIS — R208 Other disturbances of skin sensation: Principal | ICD-10-CM

## 2021-11-26 DIAGNOSIS — R1084 Generalized abdominal pain: Principal | ICD-10-CM

## 2021-11-26 DIAGNOSIS — G894 Chronic pain syndrome: Principal | ICD-10-CM

## 2021-11-26 MED ORDER — DULOXETINE 20 MG CAPSULE,DELAYED RELEASE
ORAL_CAPSULE | Freq: Every day | ORAL | 0 refills | 30 days
Start: 2021-11-26 — End: 2021-12-26

## 2021-11-27 MED ORDER — DULOXETINE 20 MG CAPSULE,DELAYED RELEASE
ORAL_CAPSULE | Freq: Every day | ORAL | 0 refills | 30.00000 days | Status: CP
Start: 2021-11-27 — End: 2021-12-27

## 2021-12-02 ENCOUNTER — Encounter: Payer: Self-pay | Admitting: *Deleted

## 2021-12-02 ENCOUNTER — Ambulatory Visit
Admit: 2021-12-02 | Discharge: 2021-12-03 | Payer: PRIVATE HEALTH INSURANCE | Attending: Clinical Neuropsychologist | Primary: Clinical Neuropsychologist

## 2021-12-02 DIAGNOSIS — D481 Neoplasm of uncertain behavior of connective and other soft tissue: Principal | ICD-10-CM

## 2021-12-02 DIAGNOSIS — R413 Other amnesia: Principal | ICD-10-CM

## 2021-12-02 DIAGNOSIS — Q8789 Other specified congenital malformation syndromes, not elsewhere classified: Principal | ICD-10-CM

## 2021-12-02 DIAGNOSIS — Z9049 Acquired absence of other specified parts of digestive tract: Principal | ICD-10-CM

## 2021-12-02 NOTE — Unmapped (Signed)
[facility] BONE AND SOFT TISSUE ONCOLOGY RETURN VISIT    Encounter Date: 12/03/2021  Patient Name: [name] [name]  Medical Record Number: [id number]    Referring Physician: Sheral Apley, MD  [id number]  [facility]  [place],  [place] [address]    Primary Care Provider: Jarold Motto, [facility]    DIAGNOSIS:  1. [name]    2. Iron deficiency anemia due to chronic blood loss    3. Severe protein-calorie malnutrition (CMS-HCC)    4. Acute deep vein thrombosis (DVT) of axillary vein of right upper extremity (CMS-HCC)    5. History of colonic polyps    6. Syncope, unspecified syncope type    7. Chemotherapy-induced nausea    8. History of colectomy            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    Desmoid fibromatosis, history of [name]     She has had numerous sites of disease, including paraspinal, chest wall, right arm/wrist. She has had several cryoablations with Dr. Cathie Hoops which have seemed to offer clinical benefit. She trialed sorafenib which seemed to lead to shrinkage of lesions, but she had significant intolerance with dizziness, presyncope, fatigue, hair loss, rash, GI side effects.      We have discussed at length the nature of desmoid fibromatosis tumors, the fact they can wax and wane independent of therapy, are not cancers in a metastatic potential, but can be locally infiltrative and cause substantial morbidity to the site of disease and occasionally mortality. I reviewed that the emerging standard of care is observation based on the fact that 20-30% will experience spontaneous regression in the year following diagnosis. Given her long experience with desmoids, complete regression may be less likely, but we will still approach this cautiously with parallel goals of avoidance of overtreatment (particularly surgical) and palliation of symptoms / preservation of function.     Current lesions are in the shoulder, right forearm, chest area, with a possible desmoid in her left thigh. Symptoms are manageable at the moment, after recent cryoablations and steroid injection to the forearm.     - Her poor tolerance of sorafenib was likely multifactorial: simultaneous COVID infection and difficulty with good supportive care given that she was in a busy college semester several hours away from her medical team. Would not rule out trying this again in the future with optimized supportive care  - Alternatively, pazopanib would be a reasonable option, per DESMOPAZ (82% nonprogression at 6 mos, similar RR to sorafenib)  - clinical trials, novel or off-label therapies may be reasonable if available: potential agents include DFMO, gamma secretase inhibitors, NOTCH pathway inhibitors    11/09/20: MRI without evidence of recurrent desmoids, stability of prior lesions.  02/14/21: Biopsy of thigh muscle thickening - benign, hypocellular fibrous tissue. Favor Gardner fibroma - on the desmoid spectrum but less cellular and less aggressive   02/14/21: Percutaneous steroid injection of forearm lesions.    03/19/21: Lots of active issues, although desmoids are not one of them. On TPN for diarrhea, vomiting, intolerance of food, significant malnutrition. Having to withdraw partially from school given medical issues.     06/21/21: Has had a complex few months, on TPN, significant fluid overload, caused worsening desmoid pain. Now off TPN, nutrition slowly improving, tumor pain better. Scans show stable disease, although final read still pending. Continue active surveillance - next scans in 6 mos and would include thigh in next set  of scans.    ADDENDUM: Final MRI read demonstrates 2 ill defined soft tissue nodules, personally reviewed and discussed with radiology. Concerning for recurrent desmoid. Will obtain December CT scans for interpretation to evaluate if these are truly new or if they were present at that time. Would start with surveillance, given risks of systemic therapy after recent significant GI and medical issues. Repeat MRI in 2 mos instead of originally planned 6 mos. Called patient and reviewed findings, she was in agreement with the plan.    09/03/21: Scans show stable disease. Was admitted for abdominal pain, concern for intussusception, N/V, but we discussed that these may not be related to her desmoids. Opted for continued surveillance.    12/03/21: Scans personally reviewed, interpreted and discussed with the patient, which show slow growth when reflecting over the past several months. Importantly, she reports substantially worsening abdominal pain which is centered on her desmoids, poor appetite, weight loss, possible transient obstructive symptoms. Long conversation about options and pros and cons of each. She would like to trial sorafenib again, acknowledging the risks. She will meet with GI, her psychiatry team (she has had challenges with duloxetine), pain team, and may reconnect with Dr. Selena Batten in the interim to discuss other supportive care measures and procedural options. RTC 1 month to make final decision.     Anxiety  AYA Needs  Patient is 21 and in the past year has had the collision of numerous medical challenges which have pushed her to see her health and future medical care more clearly. Trying to balance all of her medical issues this with future goals of career, nursing, college. Significant burden of anxiety, maybe depression as well. Was reportedly diagnosed with medical trauma by the counseling office at her university. Also trying to establish her own autonomy after a medicalized childhood  - connected with AYA team, [name]. May need input from [facility] or others at some point  - seen by [facility] palliative care sarcoma collaborative, [name], will assist with pain management and symptom control, as well as grappling with current issues relating to cancer, AYA, development     Tumor pain: seen by [facility] sarcoma palliative care, [name]. Overall tumor pain is relatively well-controlled. Primary pain triggers today include PICC line, tunneled line, and persistent abdominal pain.  -defer to pain team, but cymbalta may not be an effective agent for her.     Contraception: Not reviewed today, will need to confirm prior to initiation of TKI    Line Associated DVT:   Duplex demonstrates DVT shortly after line placement. Treated with Apixaban 5mg  BID x 3 mos, now completed    - RTC 1 month to discuss sorafenib vs alternative therapies    I personally reviewed the medical records, pathology and laboratory results and viewed the imaging. All questions were answered to the patient's apparent satisfaction and they voiced understanding and agreement with the plan. Pt has my card and contact information and is encouraged to reach out with any further questions.    Donzetta Sprung, MD/MPH  Assistant Professor  Bone and Soft Tissue Oncology Program  [facility]  Pager: [phone]  Nurse Navigator: Roseanne Reno RN, BSN, OCN      REASON FOR CONSULTATION:   [name] [name] is a 21 y.o. female who is seen in consultation at the request of [name] Jonita Albee, MD for evaluation of her desmoid fibromatosis    HISTORY OF PRESENT ILLNESS:  Attends school at [employer] in [place], [place]      Right arm desmoid is the most bothersome - significant arm cramping, deep aching pain, lasts for ~30 min after cramping starts. Has noticed right hand shaking frequently. 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     Left chest lesion used to be quite uncomfortable, caused swelling in her arm and discomfort. Improved after cryoablation.     Right shoulder blade hurts when she leans back against it, fairly painful. Right chest wall lesion has some associated pain, improving since cryo.     First diagnosed at 21 years old - seen in [place] at first, had numerous cysts. First major issues were two paraspinal lesions, referred to [facility] for biopsy and ultimately resection.  Soon diagnosed with [name], seen by medical genetics early on.  Guided by Dr. Debbe Mounts, referred out to proceduralists as needed.  2011 - had a growing lesion on her back as well as bilateral neck lesions, resected at [facility], found to be desmoid  02/2012 - surgery for thoracic spine desmoid. Maybe another flank lesion removed  2017 - wrist lesion treated by [facility] with bupivicaine, kenalog  08/2016 - endoscopy with adenomatous polyp of the colon, non-dysplastic  02/2017 - subcutaneous lesion resection by plastic surgery (postauricular and RUE, RLE) - path revealed epidermal inclusion cysts, osteomas  05/2018: cryotherapy of left anterior chest wall lesion  05/2018: started sulindac but struggled with adherence due to GI side effects  08/2018: cryo #2 to chest wall lesion  12/10/2018: started sorafenib due to progression in left paraspinal lesion, ?posterior chest wall lesion  02/2019: discontinued sorafenib for several reasons: fatigue, dizziness, nausea, hair loss, as well as COVID infection in late November. Derm consulted for rash / hair loss, started on antihistamines, topical steroids. Trialed lower dose but ultimately stopped again 05/2019 due to toxicity.  06/2019: follow up with surgical oncology - plans for observation rather than repeat surgery  07/2019: cryoablation of anterior chest wall desmoid  07/2019: excision of subcutaneous LE mass and L neck masses, clinically appeared consistent with cysts  08/2019: cryoablation of R posterior chest wall, left paraspinal desmoid     Interval History:  She reports that it has been a very difficult stretch of late.  She reports worsening pain, centered in her lower abdomen, some sites are clearly directly associated with known desmoid tumors which are palpable.  She has episodes of more extreme sharp pain, she describes it as excruciating.  These are relatively short-lived, and do not have clear associated aggravating or alleviating factors.  They sometimes occur after eating although not consistently.  Her bowel movements have continued during these times, sometimes with diarrhea.  Some trace blood, although not as much as she is experienced previously.  Mental health has been challenging, with substantial anxiety.  The duloxetine seem to help marginally at first, but now is making her feel tingly and weird.  She feels ready to try the sorafenib again.    REVIEW OF SYSTEMS:  A comprehensive review of 12 systems was negative except for pertinent positives noted in HPI.    Past Medical History, Surgical History, Family History were reviewed personally. Any changes were updated as above.    Social History:  Attended [employer] in [place], has been interrupted by health issues  Wants to be a Engineer, civil (consulting) in pediatric hematology/oncology     Pregnancy status: premenopausal      ALLERGIES/MEDICATIONS:  Reviewed in Epic    PHYSICAL  EXAM:   BP 116/77  - Pulse 75  - Temp 36.5 ??C (97.7 ??F)  - Resp 20  - Ht 165.1 cm (5' 5)  - Wt 51.6 kg (113 lb 12.8 oz)  - SpO2 100%  - BMI 18.94 kg/m??   ECOG 0  Gen - well appearing, in no acute distress, mother accompanying her this visit  Eyes - conjunctivae clear, PERRL  ENT - mask in place  Lymph - no cervical or supraclavicular lymphadenopathy appreciated  Resp - clear to auscultation bilaterally, no wheezes, crackles or rales  CV - normal rate, regular rhythm, no murmur appreciated, no lower extremity edema noted, R tunneled central line in place with insertion site appearing clear and without significant erythema though significant tenderness to palpation proximal to the insertion site with some associated swelling of the lower neck  GI - abdomen slightly firm, mildly distended and diffusely tender, prior surgical scars appear well-healed, no masses, bowel sounds normal  Skin - no rashes, no lesions noted  Neuro - AOx3, EOM intact, no facial droop, speech fluent and coherent, moves all extremities without asymmetry  Psych - affect normal, mood okay  MSK - no joint tenderness or swelling      PATHOLOGY:   Pathology was personally reviewed as described in the HPI, detailed in Epic.     LABS:  Reviewed in Epic    RADIOLOGY:  Imaging was personally viewed and interpreted as summarized in the history (see above).

## 2021-12-03 ENCOUNTER — Ambulatory Visit: Admit: 2021-12-03 | Discharge: 2021-12-03 | Payer: PRIVATE HEALTH INSURANCE

## 2021-12-03 ENCOUNTER — Other Ambulatory Visit: Admit: 2021-12-03 | Discharge: 2021-12-03 | Payer: PRIVATE HEALTH INSURANCE

## 2021-12-03 DIAGNOSIS — E43 Unspecified severe protein-calorie malnutrition: Principal | ICD-10-CM

## 2021-12-03 DIAGNOSIS — D500 Iron deficiency anemia secondary to blood loss (chronic): Principal | ICD-10-CM

## 2021-12-03 DIAGNOSIS — D481 Neoplasm of uncertain behavior of connective and other soft tissue: Principal | ICD-10-CM

## 2021-12-03 DIAGNOSIS — I82A11 Acute embolism and thrombosis of right axillary vein: Secondary | ICD-10-CM | POA: Diagnosis not present

## 2021-12-03 MED ORDER — SORAFENIB 200 MG TABLET
ORAL_TABLET | Freq: Two times a day (BID) | ORAL | 2 refills | 15 days | Status: CP
Start: 2021-12-03 — End: 2022-12-03

## 2021-12-03 MED ORDER — DICLOFENAC 1 % TOPICAL GEL
Freq: Four times a day (QID) | TOPICAL | 0 refills | 13 days
Start: 2021-12-03 — End: 2022-12-03

## 2021-12-03 MED ORDER — BACLOFEN 5 MG TABLET
ORAL_TABLET | Freq: Two times a day (BID) | ORAL | 0 refills | 10 days | PRN
Start: 2021-12-03 — End: ?

## 2021-12-03 MED ORDER — DULOXETINE 20 MG CAPSULE,DELAYED RELEASE
ORAL_CAPSULE | Freq: Every day | ORAL | 0 refills | 30 days
Start: 2021-12-03 — End: 2022-01-02

## 2021-12-03 NOTE — Unmapped (Unsigned)
Chronic Pain Follow Up Note        Assessment and Plan    1. Chronic pain syndrome    2. Generalized abdominal pain    3. Allodynia    4. Diffuse myofascial pain syndrome    5. Cancer associated pain    6. Right shoulder pain, unspecified chronicity    7. Desmoid fibromatosis          [name] [name] is a 21 y.o. female with past medical history significant for DVT (RUE, s/p Eliquis x3 months), [name] syndrome s/p colectomy, desmoid tumor, anemia, and severe protein-calorie malnutrition who is being followed at [facility] clinic for abdominal and right shoulder pain that is consistent with the diagnosis of cancer associated pain.    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 Pt has continued and now chronic pain related to her tumors, has R shoulder pain as well. Follows with [facility] Dr [name] as well as by [facility] oncology palliative care (Dr Harrie Foreman) for pall care sx and pain mng.    [name] of skin; [name]; Cancer associated pain; Right shoulder pain, unspecified chronicity; allodynia; diffuse myofacial pain; chronic pain syndrome  Chronic, not at goal. Patient has a multi-year history of [name] s/p colectomy and multiple desmoid tumors tumors, historically over the lateral aspect of right forearm and posterior aspect of right upper arm. She has been utilizing oxycodone, gabapentin, and lidocaine with moderate benefit, though her pain continues to greatly impact her ability to be active, drive, and participate in school. Her R shoulder pain appears to be primarily nociceptive with a strong nociplastic component manifesting as allodynia and hyperalgesia.  Patient had significant hyper algesia after her TPIs last week, had transient benefit on postprocedure day 2 and 3 but subsequently overdid it and had a pain flare on Sunday.  She stated that the pain flare felt like her prior DVT and called our clinic, please see telephone encounter for more details.  She reported to her local emergency department and had a negative PVL for DVT in the right upper extremity.  Pain flare is likely due to muscle spasms compounded by hyperalgesia.  Encouraged patient to not overexert herself when she starts to feel better after procedures and to listen to her body in terms of how quickly she should resume activity.  We will provide referral to pain psychology to help with activity management and performance of ADLs in the setting of chronic pain.  Additionally, to address the muscle spasms we will give her a 10-day course of baclofen 5 to 10 mg twice daily as needed.  We will defer to repeat TPIs at this time and, only if the patient would strongly desire to repeat, attempt 1 more time.  If she has another pain flare we will not reattempt additional TPIs. 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  We will take over COM and will have her fill out her pain contract at her September follow-up and submit a UDS at that time as well.***    - Continue Voltaren 1% gel.   - We will defer TPI's at this time, can discuss at next visit about 1 more trial***  - Continue oxycodone, patient to reach out to clinic if refills are necessary prior to her next visit  -  We will have patient fill out opioid agreement and UDS at September follow-up visit, unable to collect today as power was out in clinic  - Continue lidocaine patches .  - Continue PT  - Encouraged slow resumption of normal activity and times when pain has decreased  - Referral to pain psychology for consideration of ACT to help manage activity in the setting of pain as well as ADLs  - Encouraged patient to follow-up with PCP regarding her concerns about possible malnutrition    Chronic abdominal pain  Chronic, not at goal. Worsening over the last 4 months. Continues to be secondary pain generator at this time but still significant.  As noted at her clinic visit last week, she did not tolerate amitriptyline due to numerous side effects.  She did state that, after our discussion about Cymbalta at her initial visit, she went back through her records to try to determine if she really did have nausea due to Cymbalta alone.  She notes that she feels her chemotherapy was more to blame for the nausea that was experienced and attributed to Cymbalta and is open to retrialing this medication.  This should help with her abdominal pain, central sensitization, and her musculoskeletal related desmoid pain.  We will retrial Cymbalta at the lowest dose, 20 mg daily.  We reviewed the side effects and potential benefits and she verbalized understanding.  ***    - Start Cymbalta 20 mg daily, filled x1 month ***  - Patient has found no benefit from mirtazapine and will discontinue this medication  - Stopped amitriptyline at last visit    Medication Monitoring  [place] PDMP was reviewed today and it was appropriate.  Patient brought medications to clinic today for pill count.  Count was appropriate.***  Last urine toxicology screen: N/A    It was appropriate for stated regimen.  Treatment agreement last reviewed and signed:  Aberrancy with treatment plan? no  Opioid Risk: low  Pain psychology: no  Benzodiazepines: no  Naloxone: N/A  EKG: NSR with normal Qtc (422 on 09/04/21)  MME: ~31.7    I have reviewed the [facility] Medical Board statement on use of controlled substances for the treatment of pain as well as the CDC Guideline for Prescribing Opioids for Chronic Pain. I have reviewed the [place] Controlled Substance Monitoring Database.      No follow-ups on file.      PLAN:  Today I have prescribed:  Requested Prescriptions     Pending Prescriptions Disp Refills    DULoxetine (CYMBALTA) 20 MG capsule 30 capsule 0     Sig: Take 1 capsule (20 mg total) by mouth daily.    baclofen (LIORESAL) 5 mg Tab tablet 40 tablet 0 Sig: Take 1-2 tablets (5-10 mg total) by mouth two (2) times a day as needed for muscle spasms.    diclofenac sodium (VOLTAREN) 1 % gel 100 g 0     Sig: Apply 2 g topically four (4) times a day.    oxyCODONE (ROXICODONE) 5 MG immediate release tablet 100 tablet 0     Sig: Take 1-2 tablets (5-10 mg total) by mouth every four (4) hours as needed for pain. Fill on or after: 12/22/21    oxyCODONE (ROXICODONE) 5 MG immediate release tablet 100 tablet 0     Sig: Take 1-2 tablets (5-10 mg total) by mouth every four (4) hours as needed for pain. Fill on or after: 01/21/22    oxyCODONE (ROXICODONE) 5 MG immediate release tablet 100  tablet 0     Sig: Take 1-2 tablets (5-10 mg total) by mouth every four (4) hours as needed for pain. Fill on or after: 02/20/22       No orders of the defined types were placed in this encounter.       HPI  [name] [name] is a 21 y.o. being followed at [facility] clinic for complaint of chronic pain localized to her shoulders and abdomen.      At initial visit, the patient reported that her goal is to better manage her abdominal and right shoulder pain. She stated that her pain began before she knew that she had cancer. She reported that her shoulder pain began first, and she does have a tumor under her shoulder. She described her pain as aching, stabbing, throbbing. Patient reported that her shoulder pain has worsened in the last month. She reported that she has historically had extreme sensitivity to light touch over her right shoulder. She denied radiation of pain into anterior chest wall, BLE, BUE, or low back. She noted she has experienced some tingling/pain of her RUE along what she believes to be her ulnar nerve, though she believes this was due to DVT. She denied left-sided symptoms despite having a tumor on the left side. She reports that she has a hard time getting out of bed. She states that her abdominal pain is worse with standing and walking. Patient's mother reported that the patient has undergone multiple tumor resections, and surgeries have seemed to decrease her strength. She reported she has done cryoablation to that area in the past and steroid injections to other tumors since elementary school. She stated that she has looked into acupuncture, though the provider felt that this would need to be guided by imaging. She had been working with PT for shoulder pain with benefit and inquires about extending her course. She endorsed benefit with a zero-gravity recliner. She endorsed benefit with heating pad. She reported that she got a clot from a PICC line earlier this year. She noteed that she does try to be as active as able, and she was recently able to enjoy going to the beach. She shared that she has had some issue with bladder control, but only with sensation of urgency and not with the act of urinating. She denied saddle anesthesia.     She has since had TPIs that conferred modest benefit*** but were complicated by a pain flare due to muscle spasms after. These were successfully treated with PRN baclofen and rest. She also re-trialed Cymbalta starting at 20 mg daily. Finally, she has continued to use oxycodone PRN. She has also restarted nursing school.    Today, ***    Back in school and very excited, functioning well but pain still bad     Back and shoulder pain much better     Can reach hand over head    But abd pain still bad    Cymbalta been taking it and felt it helped with nausea but now the nausea is worse and mood is worse, irritable     No pain benefits, not sleeping or eating well though    Mood is definitely changed     Social worker noticing a difference in mood    Plan for chemo in a month to address abd tumors    Wants to get off to see if nausea improves    Noted 2 instances of feeling like she couldn't translate desire to move  into action    Discussed celiac plexus, has new tethering and adhesions as well    Concerned for episodes of intermittent blocks of gi tract given transient nature of pain    Primary pain is now abd not shoulder but still there    Discussed butrans and flonase for this    Typically taking 4-5 oxy per day.     Will start butrans after test day    Given nerves^    Will provide count via mycahrt later today, did not bring pills, tries to keep them locked up***          No questionnaires available.                      ***      Current analgesic regimen:  Oxycodone 5 mg, #100 tabs per month  Baclofen PRN  ***    Previous Medication Trials:  NSAIDS: N/A  Antidepressant/anxiety: Cymbalta (stopped due to nausea, patient states that this was likely missed attributed and more likely due to chemo), amitriptyline (insomnia and increased J-pouch output)  Anticonvulsants: Gabapentin  Muscle relaxants: Robaxin  Topicals: lidocaine, coltaren gel***  Short-acting opiates: oxycodone  Long-acting opiates:   Other: Tylenol    Previous Interventions  Procedures: TPIs in August 2023 with initial pain flare, subsequent improvement, and near immediate relief flare of pain; hyperalgesic with procedure    PT: Yes    Allergies  Allergies   Allergen Reactions    Adhesive Tape-Silicones Hives and Rash     Paper tape  And tegederm ok    Ferrlecit [Sodium Ferric Gluconat-Sucrose] Swelling and Rash    Methylnaltrexone      Per Patient: I lost bowel control, severe abdominal cramping, and elevated BP    Neomycin Swelling     Rxn after ear drops; ear swelling    Papaya Hives    Morphine Nausea And Vomiting    Zosyn [Piperacillin-Tazobactam] Itching and Rash     Red and itchy    Compazine [Prochlorperazine] Other (See Comments)     Extreme agitation    Latex, Natural Rubber Rash    Opioids - Morphine Analogues Itching and Rash     Just morphine other opioids are fine       Home Medications    Current Outpatient Medications   Medication Sig Dispense Refill    acetaminophen (TYLENOL) 325 MG tablet Take 1,000 mg by mouth Two (2) times a day.      baclofen (LIORESAL) 5 mg Tab tablet Take 1-2 tablets (5-10 mg total) by mouth two (2) times a day as needed for muscle spasms. 40 tablet 0    cefdinir (OMNICEF) 300 MG capsule Take 1 capsule (300 mg total) by mouth Two (2) times a day. 180 capsule 1    diclofenac sodium (VOLTAREN) 1 % gel Apply 2 g topically four (4) times a day. 100 g 0    DULoxetine (CYMBALTA) 20 MG capsule Take 1 capsule (20 mg total) by mouth daily. 30 capsule 0    famotidine (PEPCID) 20 MG tablet Take 1 tablet (20 mg total) by mouth nightly. 90 tablet 3    gabapentin (NEURONTIN) 100 MG capsule Take 100mg  at bedtime, may increase in 100mg  increments to 300mg  nightly and 100mg  in the morning. 120 capsule 3    hydrocortisone 2.5 % cream Apply 1 application. topically daily as needed.      menthol-zinc oxide (CALMOSEPTINE) 0.44-20.6 % Oint Apply topically four (4) times a  day as needed. 113 g 11    mirtazapine (REMERON) 7.5 MG tablet Take 1 tablet (7.5 mg total) by mouth nightly. 90 tablet 3    ONETOUCH DELICA LANCETS 33 gauge Misc USE 4 TIMES A DAY (Patient not taking: Reported on 10/23/2021)      ONETOUCH VERIO FLEX METER Misc USE UP TO FOUR TIMES DAILY AS DIRECTED. (Patient not taking: Reported on 10/23/2021)      ONETOUCH VERIO TEST STRIPS Strp USE 4 TIMES A DAY (Patient not taking: Reported on 10/23/2021)      oxyCODONE (ROXICODONE) 5 MG immediate release tablet Take 1-2 tablets (5-10 mg total) by mouth every four (4) hours as needed for pain. Fill on or after: 11/01/21 100 tablet 0    zinc oxide 10 % Crea Apply 1 application. topically daily as needed.       No current facility-administered medications for this visit.       ROS  See questionnaire above      Physical Exam    VITALS:   There were no vitals filed for this visit.    Wt Readings from Last 3 Encounters:   12/03/21 51.6 kg (113 lb 12.8 oz)   10/23/21 52.3 kg (115 lb 6.4 oz)   10/02/21 53.1 kg (117 lb)       GENERAL:  The patient is well developed, well-nourished female and appears to be in Moderate distress. The patient is pleasant and interactive. Patient is a good historian.***  HEAD/NECK:    Reveals normocephalic/atraumatic. Clear sclera. Mask in place.  LUNGS:   Normal work of breathing, no supplemental O2.  EXTREMITIES:  No clubbing, cyanosis noted.  No erythema or swelling was noted along the right scapula, right shoulder, or right upper extremity.  The gross circumference of her bilateral upper extremities were equal.  NEUROLOGIC:    The patient was alert and oriented, speech fluent, normal language. CN 2-12 grossly intact.  Tremor noted in right and and forearm, slight interval increase in intensity from last visit.  MUSCULOSKELETAL:    Motor function preserved in all extremities with normal tone and bulk. Good range of motion of all extremities. The patient was able to ambulate without difficult throughout the clinic today without the assistance of a walking aid.   SKIN:   Old bandage residue over the right shoulder and scapula, 1 injection site with minor erythema (<1 mm) and apparent abrasion of skin over the injection site.***  PSY:  Appropriate affect, sad and anxious mood    Nat Christen, MD  Assistant Professor of Anesthesiology  Department of Anesthesiology   Divisions of General Anesthesiology and Pain Medicine

## 2021-12-04 ENCOUNTER — Ambulatory Visit
Admit: 2021-12-04 | Discharge: 2021-12-05 | Payer: PRIVATE HEALTH INSURANCE | Attending: Student in an Organized Health Care Education/Training Program | Primary: Student in an Organized Health Care Education/Training Program

## 2021-12-04 DIAGNOSIS — R1084 Generalized abdominal pain: Principal | ICD-10-CM

## 2021-12-04 DIAGNOSIS — G893 Neoplasm related pain (acute) (chronic): Principal | ICD-10-CM

## 2021-12-04 DIAGNOSIS — M7918 Myalgia, other site: Principal | ICD-10-CM

## 2021-12-04 DIAGNOSIS — G894 Chronic pain syndrome: Principal | ICD-10-CM

## 2021-12-04 DIAGNOSIS — M25511 Pain in right shoulder: Principal | ICD-10-CM

## 2021-12-04 DIAGNOSIS — R208 Other disturbances of skin sensation: Principal | ICD-10-CM

## 2021-12-04 DIAGNOSIS — D481 Neoplasm of uncertain behavior of connective and other soft tissue: Principal | ICD-10-CM

## 2021-12-04 DIAGNOSIS — Z452 Encounter for adjustment and management of vascular access device: Secondary | ICD-10-CM | POA: Diagnosis not present

## 2021-12-04 DIAGNOSIS — K91850 Pouchitis: Secondary | ICD-10-CM | POA: Diagnosis not present

## 2021-12-04 DIAGNOSIS — Z515 Encounter for palliative care: Secondary | ICD-10-CM | POA: Diagnosis not present

## 2021-12-04 DIAGNOSIS — F4322 Adjustment disorder with anxiety: Secondary | ICD-10-CM | POA: Diagnosis not present

## 2021-12-04 DIAGNOSIS — F410 Panic disorder [episodic paroxysmal anxiety] without agoraphobia: Secondary | ICD-10-CM | POA: Diagnosis not present

## 2021-12-04 DIAGNOSIS — E46 Unspecified protein-calorie malnutrition: Secondary | ICD-10-CM | POA: Diagnosis not present

## 2021-12-04 DIAGNOSIS — G8929 Other chronic pain: Secondary | ICD-10-CM | POA: Diagnosis not present

## 2021-12-04 DIAGNOSIS — M25512 Pain in left shoulder: Secondary | ICD-10-CM | POA: Diagnosis not present

## 2021-12-04 DIAGNOSIS — Q8789 Other specified congenital malformation syndromes, not elsewhere classified: Secondary | ICD-10-CM | POA: Diagnosis not present

## 2021-12-04 DIAGNOSIS — D48119 Desmoid tumor of unspecified site: Secondary | ICD-10-CM | POA: Diagnosis not present

## 2021-12-04 DIAGNOSIS — D1391 Familial adenomatous polyposis: Secondary | ICD-10-CM | POA: Diagnosis not present

## 2021-12-04 DIAGNOSIS — R1031 Right lower quadrant pain: Secondary | ICD-10-CM | POA: Diagnosis not present

## 2021-12-04 DIAGNOSIS — R6339 Other feeding difficulties: Secondary | ICD-10-CM | POA: Diagnosis not present

## 2021-12-04 DIAGNOSIS — R1032 Left lower quadrant pain: Secondary | ICD-10-CM | POA: Diagnosis not present

## 2021-12-04 DIAGNOSIS — F419 Anxiety disorder, unspecified: Secondary | ICD-10-CM | POA: Diagnosis not present

## 2021-12-04 DIAGNOSIS — R109 Unspecified abdominal pain: Secondary | ICD-10-CM | POA: Diagnosis not present

## 2021-12-04 DIAGNOSIS — K633 Ulcer of intestine: Secondary | ICD-10-CM | POA: Diagnosis not present

## 2021-12-04 DIAGNOSIS — R110 Nausea: Secondary | ICD-10-CM | POA: Diagnosis not present

## 2021-12-04 DIAGNOSIS — Z980 Intestinal bypass and anastomosis status: Secondary | ICD-10-CM | POA: Diagnosis not present

## 2021-12-04 DIAGNOSIS — K561 Intussusception: Secondary | ICD-10-CM | POA: Diagnosis not present

## 2021-12-04 DIAGNOSIS — K5900 Constipation, unspecified: Secondary | ICD-10-CM | POA: Diagnosis not present

## 2021-12-04 DIAGNOSIS — L309 Dermatitis, unspecified: Secondary | ICD-10-CM | POA: Diagnosis not present

## 2021-12-04 DIAGNOSIS — Z79899 Other long term (current) drug therapy: Secondary | ICD-10-CM | POA: Diagnosis not present

## 2021-12-04 DIAGNOSIS — F411 Generalized anxiety disorder: Secondary | ICD-10-CM | POA: Diagnosis not present

## 2021-12-04 DIAGNOSIS — R1110 Vomiting, unspecified: Secondary | ICD-10-CM | POA: Diagnosis not present

## 2021-12-04 DIAGNOSIS — Z681 Body mass index (BMI) 19 or less, adult: Secondary | ICD-10-CM | POA: Diagnosis not present

## 2021-12-04 DIAGNOSIS — M549 Dorsalgia, unspecified: Secondary | ICD-10-CM | POA: Diagnosis not present

## 2021-12-04 DIAGNOSIS — Z932 Ileostomy status: Secondary | ICD-10-CM | POA: Diagnosis not present

## 2021-12-04 DIAGNOSIS — K219 Gastro-esophageal reflux disease without esophagitis: Secondary | ICD-10-CM | POA: Diagnosis not present

## 2021-12-04 DIAGNOSIS — M21821 Other specified acquired deformities of right upper arm: Secondary | ICD-10-CM | POA: Diagnosis not present

## 2021-12-04 DIAGNOSIS — R634 Abnormal weight loss: Secondary | ICD-10-CM | POA: Diagnosis not present

## 2021-12-04 DIAGNOSIS — E43 Unspecified severe protein-calorie malnutrition: Secondary | ICD-10-CM | POA: Diagnosis not present

## 2021-12-04 DIAGNOSIS — M7912 Myalgia of auxiliary muscles, head and neck: Secondary | ICD-10-CM | POA: Diagnosis not present

## 2021-12-04 DIAGNOSIS — D48114 Desmoid tumor, intraabdominal: Secondary | ICD-10-CM | POA: Diagnosis not present

## 2021-12-04 DIAGNOSIS — Z3202 Encounter for pregnancy test, result negative: Secondary | ICD-10-CM | POA: Diagnosis not present

## 2021-12-04 DIAGNOSIS — M728 Other fibroblastic disorders: Secondary | ICD-10-CM | POA: Diagnosis not present

## 2021-12-04 DIAGNOSIS — R112 Nausea with vomiting, unspecified: Secondary | ICD-10-CM | POA: Diagnosis not present

## 2021-12-04 DIAGNOSIS — Z4682 Encounter for fitting and adjustment of non-vascular catheter: Secondary | ICD-10-CM | POA: Diagnosis not present

## 2021-12-04 DIAGNOSIS — R1030 Lower abdominal pain, unspecified: Secondary | ICD-10-CM | POA: Diagnosis not present

## 2021-12-04 DIAGNOSIS — Z978 Presence of other specified devices: Secondary | ICD-10-CM | POA: Diagnosis not present

## 2021-12-04 DIAGNOSIS — T451X5A Adverse effect of antineoplastic and immunosuppressive drugs, initial encounter: Secondary | ICD-10-CM | POA: Diagnosis not present

## 2021-12-04 DIAGNOSIS — E860 Dehydration: Secondary | ICD-10-CM | POA: Diagnosis not present

## 2021-12-04 DIAGNOSIS — R194 Change in bowel habit: Secondary | ICD-10-CM | POA: Diagnosis not present

## 2021-12-04 DIAGNOSIS — R197 Diarrhea, unspecified: Secondary | ICD-10-CM | POA: Diagnosis not present

## 2021-12-04 DIAGNOSIS — D126 Benign neoplasm of colon, unspecified: Secondary | ICD-10-CM | POA: Diagnosis not present

## 2021-12-04 DIAGNOSIS — G8918 Other acute postprocedural pain: Secondary | ICD-10-CM | POA: Diagnosis not present

## 2021-12-04 DIAGNOSIS — Z09 Encounter for follow-up examination after completed treatment for conditions other than malignant neoplasm: Secondary | ICD-10-CM | POA: Diagnosis not present

## 2021-12-04 DIAGNOSIS — G4700 Insomnia, unspecified: Secondary | ICD-10-CM | POA: Diagnosis not present

## 2021-12-04 LAB — HEPATIC FUNCTION PANEL
ALBUMIN: 4.5 g/dL (ref 3.4–5.0)
ALKALINE PHOSPHATASE: 51 U/L (ref 46–116)
ALT (SGPT): <7 U/L — ABNORMAL LOW (ref 10–49)
AST (SGOT): 17 U/L (ref <=34)
BILIRUBIN DIRECT: 0.2 mg/dL (ref 0.00–0.30)
BILIRUBIN TOTAL: 0.6 mg/dL (ref 0.3–1.2)
PROTEIN TOTAL: 7.2 g/dL (ref 5.7–8.2)

## 2021-12-04 LAB — CBC W/ AUTO DIFF
BASOPHILS ABSOLUTE COUNT: 0 10*9/L (ref 0.0–0.1)
BASOPHILS RELATIVE PERCENT: 0.3 %
EOSINOPHILS ABSOLUTE COUNT: 0.1 10*9/L (ref 0.0–0.5)
EOSINOPHILS RELATIVE PERCENT: 0.7 %
HEMATOCRIT: 44.2 % — ABNORMAL HIGH (ref 34.0–44.0)
HEMOGLOBIN: 15.3 g/dL — ABNORMAL HIGH (ref 11.3–14.9)
LYMPHOCYTES ABSOLUTE COUNT: 2.5 10*9/L (ref 1.1–3.6)
LYMPHOCYTES RELATIVE PERCENT: 22.8 %
MEAN CORPUSCULAR HEMOGLOBIN CONC: 34.7 g/dL (ref 32.0–36.0)
MEAN CORPUSCULAR HEMOGLOBIN: 30.3 pg (ref 25.9–32.4)
MEAN CORPUSCULAR VOLUME: 87.5 fL (ref 77.6–95.7)
MEAN PLATELET VOLUME: 10.1 fL (ref 6.8–10.7)
MONOCYTES ABSOLUTE COUNT: 0.8 10*9/L (ref 0.3–0.8)
MONOCYTES RELATIVE PERCENT: 7.1 %
NEUTROPHILS ABSOLUTE COUNT: 7.6 10*9/L (ref 1.8–7.8)
NEUTROPHILS RELATIVE PERCENT: 69.1 %
PLATELET COUNT: 228 10*9/L (ref 150–450)
RED BLOOD CELL COUNT: 5.05 10*12/L (ref 3.95–5.13)
RED CELL DISTRIBUTION WIDTH: 12.3 % (ref 12.2–15.2)
WBC ADJUSTED: 11 10*9/L (ref 3.6–11.2)

## 2021-12-04 LAB — BASIC METABOLIC PANEL
ANION GAP: 7 mmol/L (ref 5–14)
BLOOD UREA NITROGEN: 6 mg/dL — ABNORMAL LOW (ref 9–23)
BUN / CREAT RATIO: 10
CALCIUM: 9.4 mg/dL (ref 8.7–10.4)
CHLORIDE: 106 mmol/L (ref 98–107)
CO2: 25 mmol/L (ref 20.0–31.0)
CREATININE: 0.59 mg/dL — ABNORMAL LOW
EGFR CKD-EPI (2021) FEMALE: >90 mL/min/{1.73_m2} (ref >=60)
GLUCOSE RANDOM: 90 mg/dL (ref 70–179)
POTASSIUM: 3.6 mmol/L (ref 3.4–4.8)
SODIUM: 138 mmol/L (ref 135–145)

## 2021-12-04 LAB — URINALYSIS WITH MICROSCOPY WITH CULTURE REFLEX
BACTERIA: NONE SEEN /HPF
BILIRUBIN UA: NEGATIVE
BLOOD UA: NEGATIVE
GLUCOSE UA: NEGATIVE
KETONES UA: NEGATIVE
NITRITE UA: NEGATIVE
PH UA: 6 (ref 5.0–9.0)
PROTEIN UA: NEGATIVE
RBC UA: 1 /HPF (ref <=4)
SPECIFIC GRAVITY UA: 1.014 (ref 1.003–1.030)
SQUAMOUS EPITHELIAL: 3 /HPF (ref 0–5)
UROBILINOGEN UA: <2
WBC UA: 1 /HPF (ref 0–5)

## 2021-12-04 LAB — TOXICOLOGY SCREEN, URINE
AMPHETAMINE SCREEN URINE: NEGATIVE
BARBITURATE SCREEN URINE: NEGATIVE
BENZODIAZEPINE SCREEN, URINE: NEGATIVE
BUPRENORPHINE, URINE SCREEN: NEGATIVE
CANNABINOID SCREEN URINE: NEGATIVE
COCAINE(METAB.)SCREEN, URINE: NEGATIVE
FENTANYL SCREEN, URINE: NEGATIVE
METHADONE SCREEN, URINE: NEGATIVE
OPIATE SCREEN URINE: POSITIVE — AB
OXYCODONE SCREEN URINE: POSITIVE — AB

## 2021-12-04 LAB — HCG QUANTITATIVE, BLOOD: GONADOTROPIN, CHORIONIC (HCG) QUANT: <2.6 m[IU]/mL

## 2021-12-04 LAB — LIPASE: LIPASE: 32 U/L (ref 12–53)

## 2021-12-04 MED ORDER — BACLOFEN 5 MG TABLET
ORAL_TABLET | Freq: Two times a day (BID) | ORAL | 0 refills | 10 days | Status: CN | PRN
Start: 2021-12-04 — End: ?

## 2021-12-04 MED ORDER — DICLOFENAC 1 % TOPICAL GEL
Freq: Four times a day (QID) | TOPICAL | 0 refills | 13 days | Status: CP
Start: 2021-12-04 — End: 2022-12-04

## 2021-12-04 MED ORDER — BUPRENORPHINE 10 MCG/HOUR WEEKLY TRANSDERMAL PATCH
MEDICATED_PATCH | TRANSDERMAL | 2 refills | 28 days | Status: CP
Start: 2021-12-04 — End: ?

## 2021-12-04 NOTE — Unmapped (Unsigned)
AVS reviewed with patient and she verbalized understanding to all.

## 2021-12-04 NOTE — Unmapped (Addendum)
POST PROCEDURE INSTRUCTIONS   You may apply an ice pack 20-30 minutes at a time to the injection site if you experience soreness.     Keep the injection site clean and dry. You make remove the band-aid one day following the procedure.     You may take a shower but AVOID getting in to baths, pools or whirlpools for 48 HOURS AFTER THE PROCEDURE.       ACTIVITY   Refrain from heavy activity for the next 24 to 48 hours. General walking is okay. You may resume your normal activities the day following the procedure.   You may start or resume your individualized exercise program or physical therapy 48 hours after the procedure.   MEDICATIONS   Please note that it is okay to continue other prescribed medications (blood pressure, insulin, water pill, depression/anxiety pill, etc.) as well as other prescribed pain medications such as Neurontin, Lyrical, Celebrex, Ultram, Vicodin, Norco and acetaminophen (Tylenol).   SIDE EFFECTS   Increase in pain during the first 24 to 48 hours.     You might experience:   1. Mild to moderate swelling at the joint.   2. Possible bruising at the injection site.     WHEN TO CALL THE DOCTOR/NURSE   Severe pain, worse or different that the pain you had before the procedure.     Fever or chills.     Redness, or swelling around the injection site.     Call the Pain Management  Procedural nurses [phone],  during normal business hours (7 am-3 pm). If it is AFTER HOURS or during a weekend or holiday, call the hospital operator and ask for the Anesthesia Pain physician on call at [phone].   FOR EMERGENCIES, CALL 911 OR GO TO THE NEAREST HOSPITAL EMERGENCY DEPARTMENT.   ?   TO SCHEDULE APPOINTMENTS OR FOR QUESTIONS RELATED TO MEDICATIONS   Call the Pain Management Clinic at [phone]     --------------------------------------    It was good to see you today.  We are so glad that you are back in school and that that has been going well and that you have responded well to your last trigger point injections.    We hope that today's trigger point injections will be better tolerated than the last time and recommend that you use heat and take the remaining baclofen that you have at home to treat any pain flare that might occur.  Otherwise, we will have you come back for a repeat in 4 to 6 weeks and we can combine your clinic appointment with your injection appointment.  Please remember to bring your patches and any remaining oxycodone for a pill count.    Please count your oxycodone and give [name] the count today and I will send in a bridging prescription to last you until when you start your Butrans patch after your exam on next Thursday.    Otherwise, please stop taking your Cymbalta at this time.  If you develop headaches then you can alternate 1 day of Cymbalta and 1 day off of Cymbalta for a week and that should help make the wean off of the medication more tolerable.  We will not start nortriptyline at this time given your sensitivity to similar medications but can consider it in the future.    We also talked about the possibility of either me performing a celiac plexus block for your abdominal pain or requesting your GI doctors to perform the block  when they do the repeat endoscopy in the upcoming months as this can provide long-lasting abdominal pain relief.    Additionally, we discussed switching to a Butrans patch for long-acting pain control.  Butrans is likely to be better for the neuropathic and abdominal components of your pain but it works throughout Public relations account executive.  Additionally, it is not absorbed in the GI tract and bypasses it and it might be more effective.  However, in order to minimize any side effects from transition from oxycodone to [facility], you need to go down to no more than 3 pills of oxycodone per day for 2 days prior to starting the Butrans patch and then please come off of all oxycodone thereafter.  If you do not have side effects but also have no pain control then we can increase the patch dose.  Additionally, if you have irritation from the patch then we recommend using Flonase by placing 2 sprays on the area where the patch will go and letting it air dry and then placing the patch onto that area that was sprayed.    Otherwise, we agree with you restarting mirtazapine and gabapentin to try to help with your appetite, sleep, and pain.  Finally we are encouraged to hear that your oncologist have a plan for repeating chemotherapy to try to get at the abdominal tumors.    We will see you back in clinic in 4 to 6 weeks for combination clinic and trigger point injection visit and will do a pill count at that time.

## 2021-12-04 NOTE — Unmapped (Signed)
Specialty Medication(s): sorafenib    [name] [name] has been dis-enrolled from the [facility] specialty pharmacy services due to  prescription discontinued at this time.  When she is ready to start treatment, oncology team will route new prescription and [facility] will onboard patient at that time. .    Additional information provided to the patient: NA    Kermit Balo, [facility]  [facility] Specialty Pharmacist

## 2021-12-04 NOTE — Unmapped (Signed)
[facility] Specialty Medication Onboarding    Specialty Medication: Sorafenib  Prior Authorization: Not Required   Financial Assistance: No - copay  <$25  Final Copay/Day Supply: $0 / 15    Insurance Restrictions: None     Notes to Pharmacist:     The triage team has completed the benefits investigation and has determined that the patient is able to fill this medication at [facility]. Please contact the patient to complete the onboarding or follow up with the prescribing physician as needed.

## 2021-12-05 ENCOUNTER — Ambulatory Visit: Admit: 2021-12-05 | Payer: PRIVATE HEALTH INSURANCE

## 2021-12-05 ENCOUNTER — Ambulatory Visit: Admit: 2021-12-05 | Discharge: 2021-12-20 | Payer: PRIVATE HEALTH INSURANCE

## 2021-12-05 ENCOUNTER — Encounter: Admit: 2021-12-05 | Payer: PRIVATE HEALTH INSURANCE | Attending: Registered Nurse

## 2021-12-05 ENCOUNTER — Ambulatory Visit
Admit: 2021-12-05 | Discharge: 2021-12-20 | Disposition: A | Discharge status: Home Health | Payer: PRIVATE HEALTH INSURANCE

## 2021-12-05 MED ADMIN — oxyCODONE (ROXICODONE) immediate release tablet 10 mg: 10 mg | ORAL | @ 02:00:00 | Stop: 2021-12-04

## 2021-12-05 NOTE — Unmapped (Unsigned)
Trigger Point Injection of 3+ muscle groups: Right trapezius, rhomboid, infraspinatus   Pre-operative diagnosis: myofascial pain  Post-operative diagnosis: Same    Attending Physician: Dr. Darlis Loan  Assistant: None    21 y.o. 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 [facility] related to abdominal and right shoulder pain in the setting of her desmoid tumors. She is active in PT and on a multimodal pain regimen that includes PRN oxycodone, which she is trying to wean off of. We will attempt TPIs today to improve her pain control and help wean oxycodone. Her physical exam on 10/02/21 was concerning for peri-scapular trigger points, likely due to compensatory strain on the muscles in the setting of scapular winging due to the desmoid tumor.     Please see same-day clinic note for more details regarding her interval medical history; however, while she is unable to quantify a percent improvement from her TPIs, she and her mother indicate that she has been significantly more mobile in her RUE and feel the pain and tightness have improved after her post-procedure muscle spasm abated. Her mom states she was able to go shopping last week for her birthday and lift her hand above her shoulder, which is a marked functional improvement.     Serial consent signed on 10/16/21    After risks and benefits were explained including bleeding, infection, worsening of the pain, damage to the area being injected, weakness, allergic reaction to medications, vascular injection, pneumothorax, nerve damage, and the possibility of worsening desmoid tumor symptoms/growths (though I noted with them that I was unable to find literature indicating that injections of healthy muscle in patients with desmoid tumors triggers new desmoid tumor growth), signed consent was obtained.  All questions were answered.   The patient is not taking antiplatelet or anticoagulation medications and does  have a driver today.    The area of the trigger point was identified and the skin prepped with Chloraprep.  Next, an ultrasound was used to investigate the areas around her right scapula to determine if these areas were over healthy muscle or if there was scar tissue or evidence c/f desmoid tumor growth. Pictures saved to chart. Again determined the superior rhomboid, infraspinatus, trapezius appeared normal while the inferior rhomboid had significant scar tissue. Thus I again avoided the inferior rhomboid. A 27 gauge 0.5 inch needle was placed in the area of the trigger point.  Dry needling was performed in the area of the trigger point.  Once reproduction of the pain was elicited and negative aspiration confirmed, the trigger point was injected with 1 mL of 0.25% bupivacaine and the needle was removed.      The patient did tolerate the procedure well and there were not complications.      Trigger points injected: 8     Trigger point locations:  Right trapezius, rhomboid, infraspinatus     The patient was monitored for 15 minutes after the procedure.  Vital signs remained normal and the patient ambulated out of clinic.    Pre-procedure pain score: 7/10  Post-procedure pain score: 8/10    DISPO:    Pt to follow up in 4-6 weeks for combined TPIs and clinic visit

## 2021-12-09 ENCOUNTER — Ambulatory Visit: Admit: 2021-12-09 | Payer: PRIVATE HEALTH INSURANCE

## 2021-12-09 ENCOUNTER — Ambulatory Visit
Admit: 2021-12-09 | Payer: PRIVATE HEALTH INSURANCE | Attending: Student in an Organized Health Care Education/Training Program | Primary: Student in an Organized Health Care Education/Training Program

## 2021-12-09 NOTE — Unmapped (Signed)
Error

## 2021-12-09 NOTE — Unmapped (Signed)
[facility]  Initial Psychiatry Consult Note     Service Date: December 09, 2021  LOS:  LOS: 3 days      Assessment:   [name] [name] is a 21 y.o. 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  Patient was seen in consultation by Psychiatry at the request of Andres Shad, MD with [facility]) for evaluation of Anxiety.     Patient presents with diagnosis/diagnoses of generalized anxiety disorder based on the presence of multiple worries that are difficult to control, sleep disturbance, fatigue/low energy, and impaired concentration/mind going blank. That said, fatigue and sleep disturbance may also be attributable to physical symptoms, and adjustment disorder with anxiety is also a diagnostic consideration.  In either case, current anxiety symptoms seem to be heightened i/s/o medical uncertainty and ongoing pain.  Of note, she does experience panic attacks but has not been found to meet full criteria for the disorder, though assessment is ongoing.     She expresses some hesitation around starting new medications, but is open to our recommendations below.  We will continue to follow and offer titration recs as indicated/tolerated.     Please see below for detailed recommendations.    Diagnoses:   Active Hospital problems:  Principal Problem:    Intractable abdominal pain  Active Problems:    [name]    Intestinal polyps    Dehydration    Physical deconditioning    Severe protein-calorie malnutrition (CMS-HCC)    Winged scapula of right side    Lower abdominal pain    Generalized anxiety disorder with panic attacks       Problems edited/added by me:  Problem   Generalized Anxiety Disorder With Panic Attacks       Safety Risk Assessment:  Full psychiatric evaluation including risk assessment was not required based on ASQ or BSA (ex: ASQ was unable to be completed or no intervention is necessary, or BSA was scored as low risk), but a suicide and violence risk assessment was performed as part of this evaluation. Specific questioning about thoughts, plans, suicidal intent, and self-harm: Denies thoughts of self-harm. Denies suicidal ideation, plans, or intent. . Risk factors for self-harm/suicide: feelings of hopelessness and chronic severe medical condition. Protective factors against self-harm/suicide:  lack of active SI, no known access to weapons or firearms, no history of previous suicide attempts , motivation for treatment, currently receiving mental health treatment, has access to clinical interventions and support, supportive family, sense of responsibility to family and social supports, expresses purpose for living, current treatment compliance, and safe housing. Risk factors for harm to others: N/A. Protective factors against harm to others: no known history of violence towards others, no known history of threats of harm towards others, no commands hallucinations to harm others in the last 6 months, no active symptoms of psychosis, no active symptoms of mania, no previous acts of violence in current setting, high intellectual functioning, positive social orientation, and connectedness to family. Based on my clinical evaluation, I estimate the patient to be at low risk for suicide in the current setting.         Recommendations:   ## Safety and Observation Level:   -- Based on the BSA or psychiatric evaluation, we estimate the patient to be at  low risk for suicide in the current setting. We recommend routine level of observation on medical unit. This decision is based on my review of the chart including patient's history and current presentation, interview of the patient, mental status examination, and consideration of suicide risk including evaluating suicidal ideation, plan, intent, suicidal or self-harm behaviors, risk factors, and protective factors. This judgment is based on our ability to directly address suicide risk, implement suicide prevention strategies and develop a safety plan while the patient is in the clinical setting.   -- If the patient attempts to leave against medical advice and it is felt to be unsafe for them to leave, please call a Behavioral Response and page Psychiatry at [phone].  -- Please contact our team if there is a concern that risk level has changed.    ## Medications:   -- START olanzapine 1.25mg  qHS for anxiety/sleep/appetite  -- Continue mirtazapine 7.5mg  for now  -- Continue hydroxyzine 25mg  PRN q6 for acute anxiety and add indication for insomnia    ## Medical Decision Making Capacity:   -- A formal capacity assessement was not performed as a part of this evaluation.  If specific capacity questions arise, please contact our team as below.     ## Further Work-up:   -- No recommendations at this time.    ## Disposition:   -- When patient is discharged, please ensure that their AVS includes information about the 56 Suicide & Crisis Lifeline.  -- The patient will follow-up with Dr. Kathrine Haddock in the [facility] psych-onc clinic for mental health care at the time of discharge.    ## Behavioral / Environmental:   -- As much as possible, allow the patient to choose between two directives to promote sense of autonomy  -- Empathize with feelings and validate concerns    Thank you for this consult request. Recommendations have been communicated to the primary team.  We will follow as needed at this time. Please page [phone] for any questions or concerns.     Discussed with  Attending, [name], MD, who agrees with the assessment and plan.    [name], MD      History:   Relevant Aspects of Hospital Course:   Admitted on 12/05/2021 for acute on chronic abdominal pain.  Thus far her medical workup has been unrevealing; her imaging has been benign and scope today was normal, though she has been treated empirically for pouchitis.  Primary team mentions that patient has been tearful and has expressed a sense of not feeling heard.    Patient Report:   [name] [name] says that she has had a rough day.  She says the most difficult for thing for her right now is the uncertainty of what can be done to address her pain.  She is sad this has been her 10th week of hospitalization since June and describes feeling somewhat defeated by how sick she has been.  I provided supportive listening and validation.    When asked about what had happened with duloxetine, she says she felt really depressed when taking it, and also had severe anxiety that was illogical.  She isn't sure whether the mirtazapine has been helpful since she only been back on it for three days and has had difficulty sleeping with her ongoing pain.      She describes some hesitation around starting any new medications, though exhibits good insight and explains she has anxiety around new meds because of past experiences.  We discussed  possibly starting an SSRI in the future, which she is open to, but she is in agreement that for now we should prioritize agents with quicker onset of effectiveness.  She is okay with hydroxyzine remaining a medication she can use as needed, especially for panic.      ROS:   All systems reviewed as negative/unremarkable aside from the following pertinent positives and negatives: as per Patient report    Collateral information:   - Reviewed medical records in Epic  - Reviewed medical records via CareEverywhere  - Spoke to patient's mother who indicated that Salima's level of anxiety is higher than normal.  She was surprised that [name] [name] was reluctant to take hydroxyzine earlier today and mentions that [name] [name] tends to retreat when doing badly.  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        Psychiatric History:   Information collected from patient interview and chart review.  Prior psychiatric diagnoses: GAD  Psychiatric hospitalizations: none  Suicide attempts / Non-suicidal self-injury: never  Medication trials/compliance: Has never taken a medication purely for psychiatric reasons, though has taken several medications with psychotropic effects for symptom management. Amitriptyline (started for unclear reasons) made her cry 24/7 and messed up her internal J pouch, and she lost a lot of weight.  She previously took mirtazapine for appetite and sleep but this was discontinued when pain clinic started her on duloxetine.  Duloxetine was not beneficial and she is now back on mirtazapine this hospitalization.       Current psychiatrist: Kathrine Haddock  Current therapist: [name]  Denies family history of depressive disorders and bipolar and related disorders    Medical History:  Past Medical History:   Diagnosis Date    Abdominal pain     Acid reflux     occas    Anesthesia complication     itching, shaking, coldness; last few surgeries have gone much better    Cataract of right eye     COVID-19 virus infection 01/2019    Cyst of thyroid determined by ultrasound     monitoring    [name]     2 right forearm, 1 left thigh, 1 right scapula, 1 under left clavicle; multiple    Difficult intravenous access     [name] (familial adenomatous polyposis)     [name]     Gastric polyps     History of chemotherapy     last treatment approx 05/2019    History of colon polyps     History of COVID-19 01/2019    Iron deficiency anemia due to chronic blood loss     received iron infusion 11-2019    PONV (postoperative nausea and vomiting)     Rectal bleeding     Syncopal episodes     especially if becoming dehydrated       Surgical History:  Past Surgical History:   Procedure Laterality Date    cyroablation      cystis removal      desmoid removal      PR CLOSE ENTEROSTOMY,RESEC+ANAST N/A 10/09/2020    Procedure: ILEOSTOMY TAKEDOWN;  Surgeon: Mickle Asper, MD;  Location: OR [place];  Service: General Surgery    PR COLONOSCOPY W/BIOPSY SINGLE/MULTIPLE N/A 10/27/2012    Procedure: COLONOSCOPY, FLEXIBLE, PROXIMAL TO SPLENIC FLEXURE; WITH BIOPSY, SINGLE OR MULTIPLE;  Surgeon: Shirlyn Goltz Mir, MD;  Location: PEDS PROCEDURE ROOM [facility];  Service: Gastroenterology  PR COLONOSCOPY W/BIOPSY SINGLE/MULTIPLE N/A 09/14/2013    Procedure: COLONOSCOPY, FLEXIBLE, PROXIMAL TO SPLENIC FLEXURE; WITH BIOPSY, SINGLE OR MULTIPLE;  Surgeon: Shirlyn Goltz Mir, MD;  Location: PEDS PROCEDURE ROOM [facility];  Service: Gastroenterology    PR COLONOSCOPY W/BIOPSY SINGLE/MULTIPLE N/A 11/08/2014    Procedure: COLONOSCOPY, FLEXIBLE, PROXIMAL TO SPLENIC FLEXURE; WITH BIOPSY, SINGLE OR MULTIPLE;  Surgeon: Arnold Long Mir, MD;  Location: PEDS PROCEDURE ROOM [facility];  Service: Gastroenterology    PR COLONOSCOPY W/BIOPSY SINGLE/MULTIPLE N/A 12/26/2015    Procedure: COLONOSCOPY, FLEXIBLE, PROXIMAL TO SPLENIC FLEXURE; WITH BIOPSY, SINGLE OR MULTIPLE;  Surgeon: Arnold Long Mir, MD;  Location: PEDS PROCEDURE ROOM [facility];  Service: Gastroenterology    PR COLONOSCOPY W/BIOPSY SINGLE/MULTIPLE N/A 09/02/2017    Procedure: COLONOSCOPY, FLEXIBLE, PROXIMAL TO SPLENIC FLEXURE; WITH BIOPSY, SINGLE OR MULTIPLE;  Surgeon: Arnold Long Mir, MD;  Location: PEDS PROCEDURE ROOM [place];  Service: Gastroenterology    PR COLSC FLX W/REMOVAL LESION BY HOT BX FORCEPS N/A 08/27/2016    Procedure: COLONOSCOPY, FLEXIBLE, PROXIMAL TO SPLENIC FLEXURE; W/REMOVAL TUMOR/POLYP/OTHER LESION, HOT BX FORCEP/CAUTE;  Surgeon: Arnold Long Mir, MD;  Location: PEDS PROCEDURE ROOM [facility];  Service: Gastroenterology    PR COLSC FLX W/RMVL OF TUMOR POLYP LESION SNARE TQ N/A 02/25/2019    Procedure: COLONOSCOPY FLEX; W/REMOV TUMOR/LES BY SNARE;  Surgeon: Helyn Numbers, MD;  Location: [facility] PROCEDURES [place] [facility];  Service: Gastroenterology    PR COLSC FLX W/RMVL OF TUMOR POLYP LESION SNARE TQ N/A 03/13/2020    Procedure: COLONOSCOPY FLEX; W/REMOV TUMOR/LES BY SNARE;  Surgeon: Helyn Numbers, MD;  Location: [facility] [facility];  Service: Gastroenterology    PR EXC SKIN BENIG 2.1-3 CM TRUNK,ARM,LEG Right 02/25/2017    Procedure: EXCISION, BENIGN LESION INCLUDE MARGINS, EXCEPT SKIN TAG, LEGS; EXCISED DIAMETER 2.1 TO 3.0 CM;  Surgeon: Clarene Duke, MD;  Location: CHILDRENS OR [facility];  Service: Plastics    PR EXC SKIN BENIG 3.1-4 CM TRUNK,ARM,LEG Right 02/25/2017    Procedure: EXCISION, BENIGN LESION INCLUDE MARGINS, EXCEPT SKIN TAG, ARMS; EXCISED DIAMETER 3.1 TO 4.0 CM;  Surgeon: Clarene Duke, MD;  Location: CHILDRENS OR [facility];  Service: Plastics    PR EXC SKIN BENIG >4 CM FACE,FACIAL Right 02/25/2017    Procedure: EXCISION, OTHER BENIGN LES INCLUD MARGIN, FACE/EARS/EYELIDS/NOSE/LIPS/MUCOUS MEMBRANE; EXCISED DIAM >4.0 CM;  Surgeon: Clarene Duke, MD;  Location: CHILDRENS OR [facility];  Service: Plastics    PR EXC TUMOR SOFT TISSUE LEG/ANKLE SUBQ 3+CM Right 08/05/2019    Procedure: EXCISION, TUMOR, SOFT TISSUE OF LEG OR ANKLE AREA, SUBCUTANEOUS; 3 CM OR GREATER;  Surgeon: Arsenio Katz, MD;  Location: MAIN OR [place];  Service: Plastics    PR EXC TUMOR SOFT TISSUE LEG/ANKLE SUBQ <3CM Right 08/05/2019    Procedure: EXCISION, TUMOR, SOFT TISSUE OF LEG OR ANKLE AREA, SUBCUTANEOUS; LESS THAN 3 CM;  Surgeon: Arsenio Katz, MD;  Location: MAIN OR [facility];  Service: Plastics    PR LAP, SURG PROCTECTOMY W J-POUCH N/A 08/10/2020    Procedure: ROBOTIC ASSISTED LAPAROSCOPIC PROCTOCOLECTOMY, ILEAL J POUCH, WITH OSTOMY;  Surgeon: Mickle Asper, MD;  Location: OR ;  Service: General Surgery    PR NDSC EVAL INTSTINAL POUCH DX W/COLLJ SPEC SPX N/A 01/23/2021    Procedure: ENDO EVAL SM INTEST POUCH; DX;  Surgeon: Modena Nunnery, MD;  Location: [facility] PROCEDURES [facility] [facility];  Service: Gastroenterology    PR NDSC EVAL INTSTINAL POUCH DX W/COLLJ SPEC SPX N/A 08/27/2021    Procedure: ENDO EVAL SM INTEST POUCH;  DX;  Surgeon: Hunt Oris, MD;  [facility]: [facility];  Service: Gastroenterology    PR UNLISTED PROCEDURE SMALL INTESTINE  01/23/2021    Procedure: UNLISTED PROCEDURE, SMALL INTESTINE;  Surgeon: Modena Nunnery, MD;  Location: [facility] PROCEDURES [place] [facility];  Service: Gastroenterology    PR UPPER GI ENDOSCOPY,BIOPSY N/A 10/27/2012    Procedure: UGI ENDOSCOPY; WITH BIOPSY, SINGLE OR MULTIPLE;  Surgeon: Shirlyn Goltz Mir, MD;  Location: PEDS PROCEDURE ROOM [facility];  Service: Gastroenterology    PR UPPER GI ENDOSCOPY,BIOPSY N/A 09/14/2013    Procedure: UGI ENDOSCOPY; WITH BIOPSY, SINGLE OR MULTIPLE;  Surgeon: Shirlyn Goltz Mir, MD;  Location: PEDS PROCEDURE ROOM [facility];  Service: Gastroenterology    PR UPPER GI ENDOSCOPY,BIOPSY N/A 11/08/2014    Procedure: UGI ENDOSCOPY; WITH BIOPSY, SINGLE OR MULTIPLE;  Surgeon: Arnold Long Mir, MD;  Location: PEDS PROCEDURE ROOM [facility];  Service: Gastroenterology    PR UPPER GI ENDOSCOPY,BIOPSY N/A 12/26/2015    Procedure: UGI ENDOSCOPY; WITH BIOPSY, SINGLE OR MULTIPLE;  Surgeon: Arnold Long Mir, MD;  Location: PEDS PROCEDURE ROOM [facility];  Service: Gastroenterology    PR UPPER GI ENDOSCOPY,BIOPSY N/A 08/27/2016    Procedure: UGI ENDOSCOPY; WITH BIOPSY, SINGLE OR MULTIPLE;  Surgeon: Arnold Long Mir, MD;  Location: PEDS PROCEDURE ROOM [facility];  Service: Gastroenterology    PR UPPER GI ENDOSCOPY,BIOPSY N/A 09/02/2017    Procedure: UGI ENDOSCOPY; WITH BIOPSY, SINGLE OR MULTIPLE;  Surgeon: Arnold Long Mir, MD;  Location: PEDS PROCEDURE ROOM [facility];  Service: Gastroenterology    PR UPPER GI ENDOSCOPY,BIOPSY N/A 03/13/2020    Procedure: UGI ENDOSCOPY; WITH BIOPSY, SINGLE OR MULTIPLE;  Surgeon: Helyn Numbers, MD;  Location: GI PROCEDURES [place] [facility];  Service: Gastroenterology    PR UPPER GI ENDOSCOPY,BIOPSY N/A 09/05/2021    Procedure: UGI ENDOSCOPY; WITH BIOPSY, SINGLE OR MULTIPLE;  Surgeon: Wendall Papa, MD;  Location: GI [facility]URES [facility];  Service: Gastroenterology    TUMOR REMOVAL      multiple-head, neck, back, hand, right flank, multiple       Medications:     Current Facility-Administered Medications:     acetaminophen (OFIRMEV) 10 mg/mL injection 1,000 mg, 1,000 mg, Intravenous, TID, Andres Shad, MD    baclofen (LIORESAL) tablet 5 mg, 5 mg, Oral, BID PRN, Andres Shad, MD, 5 mg at 12/06/21 2116    calcium carbonate (TUMS) chewable tablet 400 mg of elem calcium, 400 mg of elem calcium, Oral, Daily PRN, Andres Shad, MD    diclofenac sodium (VOLTAREN) 1 % gel 2 g, 2 g, Topical, QID, Andres Shad, MD, 2 g at 12/09/21 0545    diphenhydrAMINE (BENADRYL) capsule/tablet 25 mg, 25 mg, Oral, Daily PRN, Andres Shad, MD    enoxaparin (LOVENOX) syringe 30 mg, 30 mg, Subcutaneous, Q24H, Andres Shad, MD, 30 mg at 12/07/21 2309    famotidine (PEPCID) tablet 20 mg, 20 mg, Oral, Nightly, Andres Shad, MD, 20 mg at 12/08/21 2153    hydrocortisone 1 % cream, , Topical, BID PRN, Andres Shad, MD    HYDROmorphone (PF) (DILAUDID) injection 1 mg, 1 mg, Intravenous, Q3H PRN, Andres Shad, MD, 1 mg at 12/09/21 1647    hydrOXYzine (ATARAX) tablet 25 mg, 25 mg, Oral, Q6H PRN, Andres Shad, MD, 25 mg at 12/09/21 1023    ketorolac (TORADOL) injection 15 mg, 15 mg, Intravenous, Q6H PRN, Andres Shad, MD    lactated Ringers infusion, 75 mL/hr, Intravenous, Continuous, Andres Shad, MD, Last  Rate: 200 mL/hr at 12/09/21 0828, Rate Change at 12/09/21 0828    lactated Ringers infusion, 10 mL/hr, Intravenous, Continuous, Andres Shad, MD, Last Rate: 10 mL/hr at 12/09/21 0749, 10 mL/hr at 12/09/21 0749    levoFLOXacin (LEVAQUIN) tablet 500 mg, 500 mg, Oral, Q24H SCH, Andres Shad, MD, 500 mg at 12/09/21 1023    lidocaine (LIDODERM) 5 % patch 1 patch, 1 patch, Transdermal, Q24H, Andres Shad, MD, 1 patch at 12/08/21 2152    mirtazapine (REMERON) tablet 7.5 mg, 7.5 mg, Oral, Nightly, Andres Shad, MD, 7.5 mg at 12/08/21 2153    OLANZapine (ZyPREXA) tablet 1.25 mg, 1.25 mg, Oral, Nightly, Andres Shad, MD    ondansetron [facility]) injection 4 mg, 4 mg, Intravenous, Q6H, Andres Shad, MD    oxyCODONE (ROXICODONE) immediate release tablet 10 mg, 10 mg, Oral, Q4H PRN, Andres Shad, MD, 10 mg at 12/09/21 1022    polyethylene glycol (MIRALAX) packet 17 g, 17 g, Oral, BID, Andres Shad, MD, 17 g at 12/09/21 1022    thiamine mononitrate (vit B1) tablet 100 mg, 100 mg, Oral, Daily, Andres Shad, MD, 100 mg at 12/09/21 1022    Allergies:  Allergies   Allergen Reactions    Adhesive Tape-Silicones Hives and Rash     Paper tape  And tegederm ok    Ferrlecit [Sodium Ferric Gluconat-Sucrose] Swelling and Rash    Methylnaltrexone      Per Patient: I lost bowel control, severe abdominal cramping, and elevated BP    Neomycin Swelling     Rxn after ear drops; ear swelling    Papaya Hives    Morphine Nausea And Vomiting    Zosyn [Piperacillin-Tazobactam] Itching and Rash     Red and itchy    Compazine [Prochlorperazine] Other (See Comments)     Extreme agitation    Latex, Natural Rubber Rash    Opioids - Morphine Analogues Itching and Rash     Just morphine other opioids are fine       Social History:   Living situation: the patient lives with their family.  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    Family History: Reviewed and updated  The patient's family history includes Alcohol abuse in her paternal grandfather; Arthritis in her maternal grandfather; Asthma in her maternal grandfather; COPD in her paternal grandmother; Cancer in her maternal grandmother; Diabetes in her maternal grandmother; Hypertension in her maternal grandmother; Miscarriages / Stillbirths in her paternal grandmother; No Known Problems in her brother, father, maternal aunt, maternal uncle, mother, paternal aunt, paternal uncle, and sister; Other in her maternal grandmother; Stroke in her maternal grandmother; Thyroid disease in her maternal grandmother.      Objective:   Vital signs:   Temp:  [36.5 ??C (97.7 ??F)-37.2 ??C (98.9 ??F)] 36.5 ??C (97.7 ??F)  Heart Rate:  [66-99] 86  SpO2 Pulse:  [66-75] 66  Resp:  [10-18] 16  BP: (85-114)/(41-72) 104/68  MAP (mmHg):  [74-85] 74  SpO2:  [97 %-100 %] 100 %  BMI (Calculated):  [18.31] 18.31    Physical Exam:  Gen: No overt acute physical distress (tho subjectively reports pain is 8/10)  Pulm: Normal work of breathing.  Neuro/MSK: moving all four limbs spontaneously.  Skin: grossly normal; no erythema.    Mental Status Exam:  Appearance:   Thin young woman wearing leopard-patterned nightgown/dress, sitting in bed and brushing  her hair    Attitude:   calm and polite   Behavior/Psychomotor:  appropriate eye contact and no abnormal movements   Speech/Language:    Soft volume, clear and coherent.  Normal amount and prosody.    Mood:  ???It' has been a rough day???   Affect:   Mildly anxious, dysthymic, somewhat shy   Thought process:  logical, linear, clear, coherent, goal directed   Thought content:    denies thoughts of self-harm. Denies SI, plans, or intent. Denies HI.  No grandiose, self-referential, persecutory, or paranoid delusions noted.   Perceptual disturbances:   behavior not concerning for response to internal stimuli   Attention:  able to fully attend without fluctuations in consciousness   Concentration:  Able to fully concentrate and attend   Orientation:  grossly oriented.   Memory:  not formally tested, but grossly intact   Fund of knowledge:   not formally assessed   Insight:    Fair   Judgment:   Fair   Impulse Control:  Intact       Data Reviewed:  I reviewed labs from the last 24 hours.  I reviewed imaging reports from the last 24 hours.     Additional Psychometric Testing:  Not applicable.    Consult Type and Time-Based Documentation:  This patient was evaluated in person.    Time-based billing disclaimer:  I personally spent 90   minutes face-to-face and non-face-to-face in the care of this patient, which includes all pre, intra, and post visit time on the date of service.  All documented time was specific to the E/M visit and does not include any procedures that may have been performed.

## 2021-12-09 NOTE — Unmapped (Addendum)
Medicine Daily Progress Note    Assessment/Plan:  Principal Problem:    Intractable abdominal pain  Active Problems:    [name]    Intestinal polyps    Dehydration    Physical deconditioning    Severe protein-calorie malnutrition (CMS-HCC)    Winged scapula of right side    Lower abdominal pain  Resolved Problems:    * No resolved hospital problems. *         [name] [name] is a 21 y.o. 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 So far workup has been reassuring against obstruction or pouchitis.     Acute on chronic abdominal pain  Pain has been worsening past 2 weeks. She has known desmoid tumors and there has been concern from her oncologist that she is having perhaps intermittent pain related to tethering of her bowel to the desmoid tumors causing worsening pain. Per GI today, pouchoscopy showed small ulcer but not the cause of her pain. They are recommending aggressive bowel regimen to keep the stool thin to help pass it given tethering from the tumors. I discussed with patient and family - they are not sure that this will help, but would like to discuss more with GI. Dr. [name] wrote a note last week and I spoke with oncology team today who will plan on speaking with him regarding continued pain and any other interventions that could be done for her tumors, but it sounds like pursuing the sorafenib as an outpatient is likely the best option. At this point, she continues to have significant pain that is limiting her po intake, so we will need to continue to adjust and address these needs with the assistance of chronic pain/palliative care to get her to an oral regimen that helps her more. Per chronic pain outpatient note, they had been discussing possible celiac nerve block. I will reach back out to team tomorrow to see if this is an option that could be pursued.  - GI surgery consulted  - GI medicine consulted, s/p pouchoscopy today  - levofloxacin to prevent gut translocation of bacteria per GI recs. Recommend 5 days.  - ondansetron 4mg  q6 hours scheduled   - Bowel regimen: miralax BID  - GIPP is pending   - Regular diet given no evidence of obstruction at this time     Pain control  See above re: abdominal pain. She follows with chronic pain but also palliative care. I will discuss further with chronic pain tomorrow. Palliative care may be a helpful adjunct for support and other symptom management.  - Chronic pain consulted, will follow along  - consult Palliative Care tomorrow  - continue home meds: oxycodone 10mg  q4 hours prn + 1mg  IV hydromorphone q3 hours prn. Encourage oxycodone before hydromorphone  - renewed IV tylenol  - voltaren gel  - lidocaine patch  - Holding home gabapentin per patient, though Chronic pain note recommended restarting on 9/27 so we can continue to re-evaluate this   - I added prn toradol today     Anxiety  Follows with psychiatry outpatient. Given continued uncontrolled anxiety, reached out to them today.   - start zyprexa 1.25mg  nightly per psychiatry  - hydroxyzine 25 mg po q6h PRN itching or anxiety  - mirtazapine 7.5mg  at bedtime     [name], [name] and Desmoid fibromatosis  Follows with [facility] Dr [name] as well  as by [facility] oncology palliative care (Dr Harrie Foreman) for pall care sx and pain management. Planning on starting sorafenib in coming weeks.  - Pain management as above    Severe protein-calorie malnutrition  BMI 19 and has lost 8 lb in the last 1 month  - Nutrition consulted. Will discuss with them tomorrow  - regular diet  - calorie count  - daily weights   - Ensure Plus High Protein BID   - thiamine 100mg  daily x7 doses   - may need to consider TPN pending clinical course. Discuss starting PPN with nutrition tomorrow    DVT ppx: Lovenox      Diet: Regular    Code Status / HCDM   - Full Code, Discussed with patient at the time of admission   - HCDM [facility] policy, no known patient preference): [name],[name] - Mother - [phone]    HCDM [name] policy, no known patient preference): [name] - Father - [phone]    [ ]  Anticipated Discharge Location: Home  [ ]  PT/OT/DME: No needs anticipated  [ ]  CM needs: Home Health  [ ]  SW needs: None  Will place SW consult order if needed.  [ ]  Teaching: None anticipated  [ ]  Follow up appt: Appt needed  [ ]  Estimated Date of Discharge updated for patients discharging within 48 hours.    >50% of 120 minutes spent in coordination of care with nutrition, psychiatry, gastroenterology, palliative care, updating family and evaluating patient, reviewing prior records    _____________________________________________    Subjective:  Transferred to 6BT last night. Pouchoscopy today, after which she had significant pain and was quite tearful. This evening, spending time with family outside.    Labs/Studies:  Labs and Studies from the last 24hrs per EMR and Reviewed    Objective:  Temp:  [36.8 ??C (98.2 ??F)-37.1 ??C (98.8 ??F)] 36.9 ??C (98.4 ??F)  Heart Rate:  [74-99] 95  Resp:  [17-18] 18  BP: (104-114)/(59-72) 104/59  SpO2:  [97 %-99 %] 98 %    GEN: sitting in bed, in NAD  ENT: MMM and pink  CV: RRR.  2+ pulses. No edema  PULM: breathing appears comfortable  ABD: soft, mild TTP lower abdomen. Healed scars. Not distended. thin  EXT: No edema  SKIN: bruising over bilateral medial thighs along the saphenous vein. No other rashes or bruising noted.  NEURO: A&O x 3

## 2021-12-10 DIAGNOSIS — D48119 Desmoid tumor: Principal | ICD-10-CM

## 2021-12-10 MED ORDER — SORAFENIB 200 MG TABLET
ORAL_TABLET | Freq: Every day | ORAL | 2 refills | 30 days | Status: CP
Start: 2021-12-10 — End: 2022-12-10
  Filled 2021-12-11: qty 30, 15d supply, fill #0

## 2021-12-10 MED ADMIN — acetaminophen (OFIRMEV) 10 mg/mL injection 1,000 mg: 1000 mg | INTRAVENOUS | @ 09:00:00 | Stop: 2021-12-10

## 2021-12-10 MED ADMIN — HYDROmorphone (DILAUDID) tablet 3 mg: 3 mg | ORAL | @ 23:00:00 | Stop: 2021-12-24

## 2021-12-10 MED ADMIN — levoFLOXacin (LEVAQUIN) tablet 500 mg: 500 mg | ORAL | @ 12:00:00 | Stop: 2021-12-12

## 2021-12-10 MED ADMIN — HYDROmorphone (PF) (DILAUDID) injection 1 mg: 1 mg | INTRAVENOUS | @ 12:00:00 | Stop: 2021-12-19

## 2021-12-10 MED ADMIN — ondansetron (ZOFRAN) injection 4 mg: 4 mg | INTRAVENOUS | @ 21:00:00

## 2021-12-10 MED ADMIN — HYDROmorphone (PF) (DILAUDID) injection 1 mg: 1 mg | INTRAVENOUS | @ 20:00:00 | Stop: 2021-12-19

## 2021-12-10 MED ADMIN — polyethylene glycol (MIRALAX) packet 17 g: 17 g | ORAL | @ 12:00:00 | Stop: 2021-12-10

## 2021-12-10 MED ADMIN — ondansetron (ZOFRAN) injection 4 mg: 4 mg | INTRAVENOUS | @ 16:00:00

## 2021-12-10 MED ADMIN — polyethylene glycol (MIRALAX) packet 17 g: 17 g | ORAL | @ 20:00:00

## 2021-12-10 MED ADMIN — HYDROmorphone (PF) (DILAUDID) injection 1 mg: 1 mg | INTRAVENOUS | @ 09:00:00 | Stop: 2021-12-19

## 2021-12-10 MED ADMIN — HYDROmorphone (PF) (DILAUDID) injection 1 mg: 1 mg | INTRAVENOUS | @ 04:00:00 | Stop: 2021-12-19

## 2021-12-10 MED ADMIN — HYDROmorphone (PF) (DILAUDID) injection 1 mg: 1 mg | INTRAVENOUS | @ 16:00:00 | Stop: 2021-12-19

## 2021-12-10 MED ADMIN — acetaminophen (OFIRMEV) 10 mg/mL injection 1,000 mg: 1000 mg | INTRAVENOUS | @ 16:00:00 | Stop: 2021-12-10

## 2021-12-10 MED ADMIN — ondansetron (ZOFRAN) injection 4 mg: 4 mg | INTRAVENOUS | @ 09:00:00

## 2021-12-10 MED ADMIN — thiamine mononitrate (vit B1) tablet 100 mg: 100 mg | ORAL | @ 12:00:00 | Stop: 2021-12-17

## 2021-12-10 NOTE — Unmapped (Addendum)
[name] [name] is a 21 y.o. 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 Her hospital course is detailed below:     Acute on chronic abdominal pain  [name] [name] reported that her pain has been worsening over the past 2 weeks until it became intolerable on her home pain regimen. She has known desmoid tumors and there has been concern from her oncologist that she is having perhaps intermittent pain related to tethering of her bowel to the desmoid tumors causing partial obstruction or intussusception. She had extensive workup including a CT abdomen and pelvis which showed no evidence of bowel obstruction, but enteric soft tissue lesions favored to reflect desmoids and nodularity in the upper right rectus. She underwent pouchoscopy with GI to evaluate for pouchitis, but this was largely unremarkable (normal mucosa of both efferent and afferent limb, no evidence of stricture. Two small ulcers at anastomosis without evidence of bleeding).     Pain is likely multifactorial, including from her desmoid tumors, from motility disorder (exacerbated by opioids), and relative constipation (expect her to have more like 4-8 BMs per day and she was having 2-3).     Treatment was directed at both controlling her pain by titrating oral and IV pain medications as well as increasing her bowel regimen. Palliative care and chronic pain teams were both involved in symptom management. She was managed with IV tylenol, prn toradol, oxycodone --> po dilaudid and IV dilaudid for breakthrough pain, and gabapentin. At discharge, patient was prescribed dilaudid 3 mg q6h scheduled and dilaudid 3 mg q3h PRN for breakthrough pain (given 2 week course to get her to next outpatient palliative care appointment) as well as gabapentin 100 mg/100 mg/300 mg. Oncology was also consulted given plan to start sorafenib outpatient for the desmoid tumors, and this was started while inpatient . She was also treated with a 10 day course of levofloxacin to prevent gut translocation. At discharge, she resumed oral cefdinir, which she takes chronically to prevent pouchitis.     Severe Protein Calorie Malnutrition -   Secondary to above process.  A corpak was placed and patient was started on tube feeds.  Nutrition was consulted with final plan to continue tube feeds at the following setting on discharge: Osmolite 1.5 @ 50 ml/hr over 16 hours at night. This may be advanced to 55 ml/hr over 14 hours or 65 ml/hr over 12 hours depending on patient preference.     Severe Protein-Calorie Malnutrition in the context of chronic illness (12/11/21 1143)  Energy Intake: < or equal to 75% of estimated energy requirement for > or equal to 1 month  Interpretation of Wt. Loss: > 7.5% x 3 month  Malnutrition Score: 2    Wt Readings from Last 10 Encounters:   12/09/21 49.9 kg (110 lb)   12/03/21 51.6 kg (113 lb 12.8 oz)   10/23/21 52.3 kg (115 lb 6.4 oz)   10/02/21 53.1 kg (117 lb)   10/02/21 53.3 kg (117 lb 9.6 oz)   08/27/21 53.1 kg (117 lb)   08/01/21 57.2 kg (126 lb)   07/16/21 56.7 kg (125 lb)   06/28/21 56.9 kg (125 lb 6.4 oz)   06/21/21 56 kg (123 lb 8 oz)          Anxiety  Patient follows with psychiatry outpatient. Given continued uncontrolled anxiety, they saw her within the hospital and started [place] on  2.5 mg olanzapine nightly. She was continued on as needed hydroxyzine and nightly mirtazapine.     [name], [name] and Desmoid fibromatosis  She follows with [facility] Dr [name] as well as by [facility] oncology palliative care (Dr Harrie Foreman and [name]) for pall care sx and pain management. Sorafenib was started in patient and patient will continue this upon discharge.     Eczematous dermatitis  [name] [name] previously followed with dermatology in 2020-2021 while on sorafenib for eczematous dermatitis. On day of discharge, she noted that she had gradually had some dryness and redness to her face and hands. She should follow up with dermatology, but I prescribed her protopic ointment to apply to her face and triamcinolone ointment to apply to her body as she had previously been prescribed.

## 2021-12-10 NOTE — Unmapped (Signed)
[facility] Specialty Medication Onboarding    Specialty Medication: Sorafenib  Prior Authorization: Not Required   Financial Assistance: No - copay  <$25  Final Copay/Day Supply: $0 / 15    Insurance Restrictions: Yes - max 15 day supply for first 1-3 months of therapy     Notes to Pharmacist:     The triage team has completed the benefits investigation and has determined that the patient is able to fill this medication at [facility]. Please contact the patient to complete the onboarding or follow up with the prescribing physician as needed.

## 2021-12-10 NOTE — Unmapped (Signed)
Brief Oncology Note    [name] [name] is a 21 y.o. with multiply refractory desmoid tumors in the setting of [name], with associated significant tumor associated pain, severe malnutrition, anxiety.    Discussion:   I think her pain and challenges with nutrition are at least partially driven by her desmoids. They are growing on scans, albeit slowly, and have associated bowel tethering that is likely contributing to her GI issues and malnutrition.    Plan:   I recommend:  -start sorafenib. I will order and facilitate drug delivery through our [facility] shared services pharmacy. This can be done in house but does not need to keep her in the hospital  -optimize pain regimen. Appreciate palliative care and pain service involvement. I think she needs a better short term pain control regimen which can be de-escalated in the not so distant future  -nutrition eval. Appreciate nutrition consultation - I do not generally manage TPN but would want to see all options available to support her in the short term while we hopefully get her desmoids under control.    Discussed all of this with the patient and her mother. Shared visit with [name] LCSW. Recommendations relayed to primary and consulting teams.    Donzetta Sprung, MD/MPH  Assistant Professor  Bone and Soft Tissue Oncology Program  [facility]  Pager: [phone]  Nurse Navigator: Roseanne Reno RN, BSN, OCN

## 2021-12-10 NOTE — Unmapped (Signed)
Gastroenterology (Luminal) Consult Service   Progress Note         Assessment & Plan:   [name] [name] is a 67F Hx [name] (pt of Dr. [name]) s/p IPAA p/w explosive and frequent stools and n/v/inability to tolerate PO. On empiric levaquin given dilated loops of bowel on CTAP to prevent bacterial translocation.     S/p Pouchoscopy 12/09/2021 with normal mucoca of both efferent and afferent limb, no evidence of stricture. Two small ulcers were found at anastomosis and in efferent limb without evidence of bleeding.     Patient continues to have severe abdominal pain that is likely multifactorial. She has known desmoid tumors which could be contributing and she also likely has a motility disorder which is exacerbated by opioids. Normal number of bowel movements for a patient with a pouch is 4-8 bowel movements per day and she is currently only having 2-3 bowel movements and we will therefore plan to increase the Miralax to see if that can help with some pain and discomfort from constipation. As an outpatient, she follows with pain management; they are considering a celiac plexus block to help with abdominal pain in the future. Will also consider getting Motegrity for her as an outpatient to help with motility.     Recommendations:  Increase Miralax to TID  If still having having significant nausea with food, will plan to add low dose of reglan prior to meals  Try to avoid IV opioid medications    I have communicated recommendations with the patient's primary team    Thank you for involving [name] in the care of your patient. We will continue to follow along with you.     Dr. Caryn Section, GI fellow    Thank you for this consult. I saw and evaluated the patient, participating in the key portions of the service. I reviewed the fellow???s or resident's note and agree with the fellow???s or resident's findings and plan.    Aleatha Borer, MD MSc  Associate Professor of Medicine, Division of Gastroenterology and Hepatology    For questions, contact the on-call fellow for the Gastroenterology (Luminal) Consult Service at [phone].     Interval History:   Zofran x 1. Miralax x 1. 2-3 bowel movements yesterday. On levaquin since 9/30.  On mirtazapine 7.5 mg nightly, Zyprexa 1.25 mg. Continues to have severe abdominal pain that worsens with eating.     Objective:   Temp:  [36.5 ??C (97.7 ??F)-36.8 ??C (98.2 ??F)] 36.7 ??C (98.1 ??F)  Heart Rate:  [66-86] 67  SpO2 Pulse:  [66] 66  Resp:  [11-18] 18  BP: (87-118)/(46-77) 118/77  SpO2:  [98 %-100 %] 100 %    Gen: WDWN in NAD, answers questions appropriately  Abdomen: Soft, NTND. [name] palpable in the RLQ.  Extremities: No edema in the BLEs    Pertinent Labs/Studies Reviewed:   12/07/21 GI pathogen panel negative, utox apart from oxycodone and metabolites  12/06/21 CT a/p: unchanged desmoid tumors in the abdomen   --5.0 x 3.1 cm left lower quadrant mass (2:69)   --4.8 x 2.6 cm mass in the mid lower abdomen (2:95)   -- 2.7 x 0.9 cm mass in the left upper quadrant (2:58)   .  1.8cm nodule in rectus suspect desmoid tumor, new  Sequelae of prior colectomy with ileoanal anastomosis. No evidence of bowel obstruction.   12/09/21 pouchoscopy:  The perianal and digital rectal examinations were normal.       The ileocolonic  anastomosis contained a single (solitary) three mm        ulcer. No bleeding was present. No stigmata of recent bleeding were seen.       The exam was otherwise without abnormality.       The j-pouch contained a single (solitary) three mm ulcer. No bleeding        was present.  09/05/21: upper endoscopy with adenoma in duodenum

## 2021-12-10 NOTE — Unmapped (Signed)
Medicine Daily Progress Note    Assessment/Plan:  Principal Problem:    Intractable abdominal pain  Active Problems:    [name]    Intestinal polyps    Dehydration    Physical deconditioning    Severe protein-calorie malnutrition (CMS-HCC)    Winged scapula of right side    Lower abdominal pain    Generalized anxiety disorder with panic attacks  Resolved Problems:    * No resolved hospital problems. *         [name] [name] is a 21 y.o. 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 So far workup has been reassuring against obstruction or pouchitis, but she continues to have severe pain limiting eating and requiring IV pain medications.    Acute on chronic abdominal pain  See hospital course for more discussion. So far obstruction has been ruled out by CT. Per GI, no pouchitis seen on exam yesterday. Patient and family have many questions about other possible workup, but also think that her pain is related to the desmoid tumors. I have reached out to oncology as well regarding this - they have plans to start sorafenib in the next few weeks outpatient. It seems that if the tumors are what is causing her pain, then adjusting her oral medications as we can to address this and help her get to when she will be starting sorafenib is the goal. Also reports of visceral hyperalgesia. Chronic pain follows outpatient and had discussed alternatives such as celiac nerve block - I will reach out to them but it sounded like this was not something that would be done more acutely per family.   - GI surgery consulted  - GI medicine consulted, s/p pouchoscopy. Team will meet with family  - levofloxacin to prevent gut translocation of bacteria per GI recs. Recommend 5 days (complete today 12/10/21)  - ondansetron 4mg  q6 hours scheduled   - Bowel regimen: miralax BID  - GIPP is pending   - Regular diet given no evidence of obstruction at this time   - Palliative care consulted to assist with other symptom management    Pain control  See above re: abdominal pain. She follows with chronic pain but also palliative care. I will discuss further with chronic pain tomorrow. Palliative care may be a helpful adjunct for support and other symptom management.  - Chronic pain consulted, will follow along  - consult Palliative Care tomorrow  - continue home meds: oxycodone 10mg  q4 hours prn + 1mg  IV hydromorphone q3 hours prn. Encourage oxycodone before hydromorphone. She did take a dose yesterday, but reports concern that po medications will make her pain worse.  - renewed IV tylenol  - voltaren gel  - lidocaine patch  - Holding home gabapentin per patient, though Chronic pain note recommended restarting on 9/27 so we can continue to re-evaluate this   - prn toradol added, can encourage her to try this    Anxiety  Follows with psychiatry outpatient. Given continued uncontrolled anxiety, reached out to them today.   - started zyprexa 1.25mg  nightly per psychiatry  - hydroxyzine 25 mg po q6h PRN itching or anxiety  - mirtazapine 7.5mg  at bedtime     [name], [name] and Desmoid fibromatosis  Follows with [facility] Dr [name] as well as by [facility] oncology palliative care (Dr Harrie Foreman) for pall care sx and pain management. Planning  on starting sorafenib in coming weeks.  - Pain management as above    Severe protein-calorie malnutrition  BMI 19 and has lost 8 lb in the last 1 month  - Nutrition consulted. Will discuss with them tomorrow  - regular diet  - calorie count  - daily weights   - Ensure Plus High Protein BID   - thiamine 100mg  daily x7 doses   - Nutrition evaluating today - may recommend trialing NGT feeds if unable to take more po     DVT ppx: Lovenox      Diet: Regular    Code Status / HCDM   - Full Code, Discussed with patient at the time of admission   - HCDM [facility] policy, no known patient preference): [name],[name] - Mother - [phone]    HCDM [facility] policy, no known patient preference): [name] [name] - Father - [phone]    [ ]  Anticipated Discharge Location: Home  [ ]  PT/OT/DME: No needs anticipated  [ ]  CM needs: Home Health  [ ]  SW needs: None  Will place SW consult order if needed.  [ ]  Teaching: None anticipated  [ ]  Follow up appt: Appt needed  [ ]  Estimated Date of Discharge updated for patients discharging within 48 hours.    >50% of 60 minutes spent in coordination of care with nutrition, psychiatry, gastroenterology, palliative care, updating family and evaluating patient, reviewing prior records    _____________________________________________    Subjective:  Had an ok night. Walked around with family but became tired and had significant pain, so had to get a wheelchair. Still having significant pain, unable to tolerate much eating.    Labs/Studies:  Labs and Studies from the last 24hrs per EMR and Reviewed    Objective:  Temp:  [36.5 ??C (97.7 ??F)-37.2 ??C (98.9 ??F)] 36.7 ??C (98.1 ??F)  Heart Rate:  [66-86] 67  SpO2 Pulse:  [66-75] 66  Resp:  [10-18] 18  BP: (85-118)/(41-77) 118/77  SpO2:  [98 %-100 %] 100 %    GEN: sitting in bed, in NAD  ENT: MMM and pink  CV: RRR.  2+ pulses. No edema  PULM: breathing appears comfortable  ABD: soft, mild TTP lower abdomen. Healed scars. Not distended. Thin  EXT: No edema  SKIN: bruising over bilateral medial thighs along the saphenous vein that appears old/evolving. Bedside ultrasound over the area did not show clot in the superficial femoral veins bilaterally. No swelling or TTP   NEURO: A&O x 3

## 2021-12-10 NOTE — Unmapped (Signed)
[facility]   Patient Onboarding/Medication Counseling    [name] [name] is a 21 y.o. female with [name] who I am counseling today on initiation of therapy.  I am speaking to the patient's family member, mom .    Was a Nurse, learning disability used for this call? No    Verified patient's date of birth / HIPAA.    Specialty medication(s) to be sent: Hematology/Oncology: Nexavar    Non-specialty medications/supplies to be sent: none    Medications not needed at this time: none     Nexavar (sorafenib)  The patient declined counseling on missed dose instructions, goals of therapy, side effects and monitoring parameters, warnings and precautions, and storage, handling precautions, and disposal because they have taken the medication previously. The information in the declined sections below are for informational purposes only and was not discussed with patient.     Medication & Administration     Dosage: Take 2 tablets (400 mg) by mouth once daily      Administration: Take on an empty stomach: at least 1 hour before or 2 hours after food.    Adherence/Missed dose instructions: Skip the missed dose and go back to your normal time.  Do not take 2 doses at the same time or extra doses.    Goals of Therapy     To slow disease progression    Side Effects & Monitoring Parameters     Diarrhea  Constipation  Dry skin  Hair loss  Itching  Change in taste, loss of appetite, weight loss  Mouth irritation, mouth sores  Muscle pain, muscle spasm, joint pain  Acne  Fatigue: loss of energy and strength  Hypertension  Hand foot syndrome  Nausea  Impaired Wound Healing    The following side effects should be reported to the provider:  Signs of infection (fever >100.4, chills, mouth sores, sputum production)  Signs of hypertension (severe headache, severe dizziness, chest pain, confusion, difficulty breathing, visual changes)  Signs of thyroid problems (change in weight without trying, anxiety, agitation, feeling very weak, hair thinning, depression, neck swelling, trouble focusing, inability handling heat or cold, menstrual changes, tremors, or sweating)  Signs of hand foot syndrome (redness or irritation of palms or soles of feet)  Signs of Stevens-Johnson syndrome (red, swollen, blistered, or peeling skin (with or without fever); red or irritated eyes; or sores in mouth, throat, nose, or eyes)  Signs of mucositis (red or swollen mouth/gums, sores in the mouth, gums or tongue, soreness or pain in the mouth or throat, difficulty swallowing or talking, dryness, mild burning, or pain when eating food white patches or pus in the mouth or on the tongue, increased mucus or thicker saliva)  Signs of bleeding (vomiting or coughing up blood, blood that looks like coffee grounds, blood in the urine or black, red tarry stools, bruising that gets bigger without reason, any persistent or severe bleeding, impaired wound healing)  Signs of liver problems (dark urine, abdominal pain, light-colored stools, vomiting, yellow skin or eyes)  Signs of fluid and electrolyte problems (mood changes, confusion, muscle pain or weakness, abnormal/fast heartbeat, severe dizziness, passing out)  Signs of kidney problems (inability to pass urine, blood in the urine, change in amount of urine passed, weight gain)  Depression  Sexual dysfunction  Shortness of breath  Excessive weight gain  Severe headache, abdominal pain, nausea or vomiting  Signs of anaphylaxis (wheezing, chest tightness, swelling of face, lips, tongue or throat)    Monitoring Parameters: CBC  with differential, electrolytes, phosphorus, lipase and amylase levels; liver function tests; thyroid function tests; pregnancy status in females of reproductive potential; blood pressure (baseline, weekly for the first 6 weeks, then periodic); monitor for hand-foot skin reaction and other dermatologic toxicities; monitor ECG in patients at risk for prolonged QT interval; signs/symptoms of bleeding, GI perforation, and heart failure. Monitor adherence.    Contraindications, Warnings, & Precautions     Contraindications:  Known severe hypersensitivity to sorafenib or any component of the formulation  Use in combination with carboplatin and paclitaxel in patients with squamous cell lung cancer    Warnings & Precautions:   Bleeding: Increased risk of bleeding may occur. Fatal bleeding events have been reported.   Cardiovascular events: May cause cardiac ischemia or infarction. In a scientific statement from the American Heart Association, sorafenib has been determined to be an agent that may exacerbate underlying myocardial dysfunction.   Dermatologic toxicity: Hand-foot skin reaction and rash (generally grades 1 or 2) are the most common drug-related adverse events, and typically appear within the first 6 weeks of treatment; severe dermatologic toxicities, including Stevens-Johnson syndrome and toxic epidermal necrolysis, have been reported; may be life-threatening.   GI perforation: GI perforation has been reported with rare frequency.  Hepatotoxicity: Sorafenib-induced hepatitis may result in hepatic failure and death has been observed.   Hypertension: May cause hypertension (generally mild-to-moderate), especially in the first 6 weeks of treatment; monitor.  QT prolongation: QT prolongation has been observed; may increase the risk for ventricular arrhythmia.  Thyroid impairment: Sorafenib impairs exogenous thyroid suppression. TSH level elevations were commonly observed in the thyroid cancer study.    Wound healing complications: May complicate wound healing; temporarily withhold treatment for patients undergoing major surgical procedures. The appropriate timing to resume sorafenib after major surgery has not been determined.  Reproductive concerns: Evaluate pregnancy status in females of reproductive potential prior to initiating sorafenib treatment. Females of reproductive potential should use an effective non-hormonal method of contraception during treatment and for 6 months after the final sorafenib dose. Males with female partners of reproductive potential should use effective contraception during treatment and for 3 months after the last sorafenib dose. Based on the mechanism of action and findings from animal reproduction studies, adverse effects on pregnancy would be expected. Sorafenib inhibits angiogenesis, which is a critical component of fetal development.  Breastfeeding considerations: It is not known if sorafenib is present in breast milk. Due to the potential for serious adverse reactions in the breastfed infant, the manufacturer recommends discontinuing breastfeeding during sorafenib treatment and for 2 weeks after the final sorafenib dose    Drug/Food Interactions     Medication list reviewed in Epic. The patient was instructed to inform the care team before taking any new medications or supplements.  Famotidine should be separated from sorafenib by at least 10-12 hours.  [name] is taking famotidine at bedtime and will take sorafenib in the morning on an empty stomach  Avoid live vaccines.      Storage, Handling Precautions, & Disposal     Store at room temperature in the original container (do not use a pillbox or store with other medications).   Caregivers helping administer medication should wear gloves and wash hands immediately after.    Keep the lid tightly closed. Keep out of the reach of children and pets.  Do not flush down a toilet or pour down a drain unless instructed to do so.  Check with your local police department or fire station about drug  take-back programs in your area.      Current Medications (including OTC/herbals), Comorbidities and Allergies     No current facility-administered medications for this visit.     Current Outpatient Medications   Medication Sig Dispense Refill    SORAfenib (NEXAVAR) 200 mg tablet Take 2 tablets (400 mg total) by mouth daily. 60 tablet 2 Facility-Administered Medications Ordered in Other Visits   Medication Dose Route Frequency Provider Last Rate Last Admin    baclofen (LIORESAL) tablet 5 mg  5 mg Oral BID PRN Andres Shad, MD   5 mg at 12/06/21 2116    calcium carbonate (TUMS) chewable tablet 400 mg of elem calcium  400 mg of elem calcium Oral Daily PRN Andres Shad, MD        diclofenac sodium (VOLTAREN) 1 % gel 2 g  2 g Topical QID Andres Shad, MD   2 g at 12/09/21 0545    diphenhydrAMINE (BENADRYL) capsule/tablet 25 mg  25 mg Oral Daily PRN Andres Shad, MD        enoxaparin (LOVENOX) syringe 30 mg  30 mg Subcutaneous Q24H Andres Shad, MD   30 mg at 12/07/21 2309    famotidine (PEPCID) tablet 20 mg  20 mg Oral Nightly Andres Shad, MD   20 mg at 12/09/21 2221    hydrocortisone 1 % cream   Topical BID PRN Andres Shad, MD        HYDROmorphone (PF) (DILAUDID) injection 1 mg  1 mg Intravenous Q3H PRN Andres Shad, MD   1 mg at 12/10/21 1541    hydrOXYzine (ATARAX) tablet 25 mg  25 mg Oral Q6H PRN Andres Shad, MD        ketorolac (TORADOL) injection 15 mg  15 mg Intravenous Q6H PRN Andres Shad, MD        lactated Ringers infusion  10 mL/hr Intravenous Continuous Andres Shad, MD 10 mL/hr at 12/09/21 0749 10 mL/hr at 12/09/21 0749    levoFLOXacin (LEVAQUIN) tablet 500 mg  500 mg Oral Q24H SCH Andres Shad, MD   500 mg at 12/10/21 0813    lidocaine (LIDODERM) 5 % patch 1 patch  1 patch Transdermal Q24H Andres Shad, MD   1 patch at 12/09/21 2010    mirtazapine (REMERON) tablet 7.5 mg  7.5 mg Oral Nightly Andres Shad, MD   7.5 mg at 12/09/21 2222    OLANZapine (ZyPREXA) tablet 1.25 mg  1.25 mg Oral Nightly Andres Shad, MD   1.25 mg at 12/09/21 2218    ondansetron (ZOFRAN) injection 4 mg  4 mg Intravenous Q6H Andres Shad, MD   4 mg at 12/10/21 1215    oxyCODONE (ROXICODONE) immediate release tablet 10 mg  10 mg Oral Q4H PRN Andres Shad, MD   10 mg at 12/09/21 1923 polyethylene glycol (MIRALAX) packet 17 g  17 g Oral TID Andres Shad, MD        thiamine mononitrate (vit B1) tablet 100 mg  100 mg Oral Daily Andres Shad, MD   100 mg at 12/10/21 1610       Allergies   Allergen Reactions    Adhesive Tape-Silicones Hives and Rash     Paper tape  And tegederm ok    Ferrlecit [Sodium Ferric Gluconat-Sucrose] Swelling and Rash    Methylnaltrexone      Per Patient: I lost bowel control, severe abdominal cramping, and elevated BP  Neomycin Swelling     Rxn after ear drops; ear swelling    Papaya Hives    Morphine Nausea And Vomiting    Zosyn [Piperacillin-Tazobactam] Itching and Rash     Red and itchy    Compazine [Prochlorperazine] Other (See Comments)     Extreme agitation    Latex, Natural Rubber Rash    Opioids - Morphine Analogues Itching and Rash     Just morphine other opioids are fine       Patient Active Problem List   Diagnosis    [name]    Intestinal polyps    [name]    Benign neoplasm of skin of trunk    Other benign neoplasm of connective and other soft tissue of upper limb, including shoulder    History of colonic polyps    [name] of skin    Thyroid cyst    Syncope    Chemotherapy-induced nausea    Pain    Iron deficiency anemia due to chronic blood loss    Dehydration    Physical deconditioning    History of colectomy    Acute deep vein thrombosis (DVT) of axillary vein of right upper extremity (CMS-HCC)    Severe protein-calorie malnutrition (CMS-HCC)    Winged scapula of right side    Lower abdominal pain    Intractable abdominal pain    Generalized anxiety disorder with panic attacks       Reviewed and up to date in Epic.    Appropriateness of Therapy     Acute infections noted within Epic:  No active infections  Patient reported infection: None    Is medication and dose appropriate based on diagnosis and infection status? Yes    Prescription has been clinically reviewed: Yes      Baseline Quality of Life Assessment      How many days over the past month did your desmoid tumor  keep you from your normal activities? For example, brushing your teeth or getting up in the morning. Patient experiencing increased pain from desmoid tumors; currently admitted.  Therapy change to sorafenib    Financial Information     Medication Assistance provided: None Required    Anticipated copay of $0 / 15 days reviewed with patient. Verified delivery address.    Delivery Information     Scheduled delivery date: 12/11/21    Expected start date: ASAP    Medication will be delivered via [facility] to the temporary address in [place].  This shipment will not require a signature.      Explained the services we provide at [facility] and that each month we would call to set up refills.  Stressed importance of returning phone calls so that we could ensure they receive their medications in time each month.  Informed patient that we should be setting up refills 7-10 days prior to when they will run out of medication.  A pharmacist will reach out to perform a clinical assessment periodically.  Informed patient that a welcome packet, containing information about our pharmacy and other support services, a Notice of Privacy Practices, and a drug information handout will be sent.      The patient or caregiver noted above participated in the development of this care plan and knows that they can request review of or adjustments to the care plan at any time.      Patient or caregiver verbalized understanding of the above information  as well as how to contact the pharmacy at [phone] option 4 with any questions/concerns.  The pharmacy is open Monday through Friday 8:30am-4:30pm.  A pharmacist is available 24/7 via pager to answer any clinical questions they may have.    Patient Specific Needs     Does the patient have any physical, cognitive, or cultural barriers? No    Does the patient have adequate living arrangements? (i.e. the ability to store and take their medication appropriately) Yes    Did you identify any home environmental safety or security hazards? No    Patient prefers to have medications discussed with   patient and or family (mom)      Is the patient or caregiver able to read and understand education materials at a high school level or above? Yes    Patient's primary language is  English     Is the patient high risk? No    SOCIAL DETERMINANTS OF HEALTH     At the [facility], we have learned that life circumstances - like trouble affording food, housing, utilities, or transportation can affect the health of many of our patients.   That is why we wanted to ask: are you currently experiencing any life circumstances that are negatively impacting your health and/or quality of life? No    Social Determinants of Health     Financial Resource Strain: Low Risk  (08/30/2021)    Overall Financial Resource Strain (CARDIA)     Difficulty of Paying Living Expenses: Not very hard   Internet Connectivity: Not on file   Food Insecurity: No Food Insecurity (08/30/2021)    Hunger Vital Sign     Worried About Running Out of Food in the Last Year: Never true     Ran Out of Food in the Last Year: Never true   Tobacco Use: Low Risk  (12/05/2021)    Patient History     Smoking Tobacco Use: Never     Smokeless Tobacco Use: Never     Passive Exposure: Past   Housing/Utilities: Low Risk  (08/30/2021)    Housing/Utilities     Within the past 12 months, have you ever stayed: outside, in a car, in a tent, in an overnight shelter, or temporarily in someone else's home (i.e. couch-surfing)?: No     Are you worried about losing your housing?: No     Within the past 12 months, have you been unable to get utilities (heat, electricity) when it was really needed?: No   Alcohol Use: Not on file   Transportation Needs: No Transportation Needs (08/30/2021)    PRAPARE - Therapist, art (Medical): No     Lack of Transportation (Non-Medical): No   Substance Use: Not on file   Health Literacy: Not on file   Physical Activity: Not on file   Interpersonal Safety: Not on file   Stress: Not on file   Intimate Partner Violence: Not on file   Depression: Not on file   Social Connections: Not on file     Would you be willing to receive help with any of the needs that you have identified today? Not applicable     Kermit Balo, [facility]  [facility] Specialty Pharmacist

## 2021-12-10 NOTE — Unmapped (Signed)
Adolescent and Young Adult Cancer Program Visit     Service date: December 10, 2021    Encounter location: Inpatient - Adult    Clinician: [name], LCSW - AYA Clinical Social Worker    Patient identifiers: [name] [name] is a 21 y.o. with desmoid tumors. [name] [name] uses she/her pronouns.     Visit Summary     Met with [name] [name] along with [name] [name], AYA NP this morning. Returned this afternoon with Dr. [name].    [name] [name]'s outstanding questions are as follows.    - Nutrition plan: has not yet seen Nutrition team on this admission. Per Dr. Irving Copas, [name] [name] plans to see her today and is considering NG tube vs TPN. [name] [name] is worried about continued declining weight and strength. Unable to walk to elevator on floor yesterday without becoming dizzy. She is curious about whether NG tube would have same effect as PO diet (nausea, GI pain), but is willing to try team's recommendations.    - Pain and desmoid tumor plan: Continued pain today that has prevented PO intake. Pain better controlled on IV dilaudid than on home oxycodone regimen alone, but she is wondering about how she can maintain good pain control outside the hospital. She asked about updated on plan to start sorafenib; spoke with Dr. [name] this afternoon and plans to start it as soon as it is delivered (likely within a few days), whether she is still admitted or at home. Discussed proactive symptom control if starting it at home. Dr. [name] discussed that immediate improvement from sorafenib is unlikely, but that with short-term adjusted pain control and initiation of sorafenib, our shared goal is to get her to a place in a few months where she is feeling and functioning better, with reduced needs for pain medication. Connected with palliative care team.    Unfortunately, [name] [name] and her mom shared that her maternal grandfather who has advanced prostate cancer has become more ill and is starting on home hospice today. Provided emotional support to [name] and to her mom, particularly around difficulty of feeling the need to be in two places at once.    Follow Up/Plan:     I will continue to follow this patient for AYA-appropriate support. They have my contact information and have been encouraged to contact me as needed.       12/10/2021     [name], LCSW  Adolescent and Young Adult Clinical Social Worker  Phone: [phone]  Email: [email or web address]

## 2021-12-10 NOTE — Unmapped (Addendum)
Pt Aox4. Up ad lib. Pain controlled well with PRN dilaudid/oxy. NAD overnight. VSS. Plan of care ongoing.    Problem: Adult Inpatient Plan of Care  Goal: Plan of Care Review  Outcome: Ongoing - Unchanged  Goal: Patient-Specific Goal (Individualized)  Outcome: Ongoing - Unchanged  Goal: Absence of Hospital-Acquired Illness or Injury  Outcome: Ongoing - Unchanged  Goal: Optimal Comfort and Wellbeing  Outcome: Ongoing - Unchanged  Goal: Readiness for Transition of Care  Outcome: Ongoing - Unchanged  Goal: Rounds/Family Conference  Outcome: Ongoing - Unchanged     Problem: Pain Acute  Goal: Acceptable Pain Control and Functional Ability  Outcome: Ongoing - Unchanged     Problem: Latex Allergy  Goal: Absence of Allergy Symptoms  Outcome: Ongoing - Unchanged     Problem: Oral Intake Inadequate  Goal: Improved Oral Intake  Outcome: Ongoing - Unchanged     Problem: Infection  Goal: Absence of Infection Signs and Symptoms  Outcome: Ongoing - Unchanged

## 2021-12-10 NOTE — Unmapped (Signed)
Patient is A/O x 4. VSS. Requests PRN pain meds q3 and prefers the IV med over the po med. Education given regarding pain medication. Mom is bedside. Met with palliative care. IV patent. Adherent to plan of care.  Problem: Adult Inpatient Plan of Care  Goal: Plan of Care Review  Outcome: Ongoing - Unchanged  Goal: Patient-Specific Goal (Individualized)  Outcome: Ongoing - Unchanged  Goal: Absence of Hospital-Acquired Illness or Injury  Outcome: Ongoing - Unchanged  Intervention: Prevent and Manage VTE (Venous Thromboembolism) Risk  Recent Flowsheet Documentation  Taken 12/10/2021 0900 by Russ Halo, RN  VTE Prevention/Management: anticoagulant therapy  Goal: Optimal Comfort and Wellbeing  Outcome: Ongoing - Unchanged  Goal: Readiness for Transition of Care  Outcome: Ongoing - Unchanged  Goal: Rounds/Family Conference  Outcome: Ongoing - Unchanged     Problem: Pain Acute  Goal: Acceptable Pain Control and Functional Ability  Outcome: Ongoing - Unchanged     Problem: Latex Allergy  Goal: Absence of Allergy Symptoms  Outcome: Ongoing - Unchanged     Problem: Oral Intake Inadequate  Goal: Improved Oral Intake  Outcome: Ongoing - Unchanged     Problem: Infection  Goal: Absence of Infection Signs and Symptoms  Outcome: Ongoing - Unchanged

## 2021-12-10 NOTE — Unmapped (Signed)
Palliative Care Consult Note    Consultation from Requesting Attending Physician:  Andres Shad, MD  Service Requesting Consult:  Med Bernita Raisin [facility])  Reason for Consult Request from Attending Physician:  Evaluation of Symptoms and Patient and Family Support  Primary Care Provider:  Jarold Motto, [facility]        Assessment/Plan:      SUMMARY:  [name] is seriously and acutely ill due to [name] s/p colectomy, desmoid tumors, complicated by co-morbid acute and chronic conditions including aneima, anxiety, chronic right shoulder pain, severe protein-calorie malnutrition, admitted with acute on chronic abdominal pain, palliative care consulted to assist with symptom management and support.      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 Worsens with eating - has only a short period after pain meds where she can eat. At home was taking oxy 5-10 mg up to 5x daily. Here, finds oxy gives only partial relief and relying more on IV dilaudid. Sees chronic pain outpatient - trigger point injections have helped significantly with R shoulder pain, planning for celiac plexus block and plan to eventually transition to butrans patch.  - Discontinue oxycodone  - Start dilaudid 3 mg PO q4h PRN   - Continue dilaudid 1 mg IV q4h PRN severe pain unrelieved by PO meds  - Resume gabapentin starting at 100 mg nightly  - Continue tylenol 1000 mg TID sch  - Continue lidocaine patch    Nausea Currently controlled on IV zofran scheduled   - If nausea worsens, consider reglan 2.5 mg PO q8 hrs (appears to have been tolerated in the past and would be helpful from a pro-motility perspective)     Constipation   Recently has had softer stools, up to 5-7x daily is normal for her   - Management per GI/primary, agree with daily scheduled miralax to maintain loose stools Weakness/deconditioning - related to long hospitalisations and malnutrition   - Nutrition consult   - Continue physical therapy, has concerns about insurance coverage running out     Anxiety - seen by [name], AYA SW and psychiatry this admission  - Started on olanzapine 1.25mg  nightly - consider uptitrating for beneficial effects on nausea, appetite  - Continue mirtazapine 7.5mg  nightly  - Continue hydroxyzine 25mg  PRN q6h for acute anxiety/insomnia      Goals of Care and Decision Making Assessment and Recommendations:     Prognosis / prognostic understanding:  Appears to have good prognostic understanding    Decisional capacity at time of visit:  Has capacity    Healthcare Decision Maker if lacks capacity:    HCDM [employer], no known patient preference): Masters,[name] - Mother - [phone]    HCDM, back-up (If primary HCDM is unavailable): Detlefsen,Jeff - Father - [phone]    Advance Directive: no    Code status:   Code Status: Full Code         Current Goals of care:  Looking forward to starting sorafenib, hopes chemotherapy will allow her to live as normal of a life as possible.       Practical, Emotional, Spiritual Support Assessment and Recommendations:  Palliative care visit today included focused interview, active listening, therapeutic use of silence, offering support, sharing empathy and summarization of today's discussion.  - Discussed with primary oncologist Dr [name], counsellor [name] as well as primary  team      Recommendations shared with primary team via Epic chat        Thank you for this consult. Please contact  Dr Lanell Matar  or page Palliative Care if there are any questions.   Palliative Care plans to visit the patient again on 10/4     Subjective:     HPI:  obtained from patient and mother  [name] [name] is a 65 year old woman with [name] s/p colectomy, desmoid fibromatosis and chronic abdominal and shoulder pain who is admitted with worsening abdominal pain likely related to tumour burden. Had pouchoscopy which showed small ulcers but no strictures or pouchitis. Now on levaquin and with plan to start sorafenib inpatient. She reports she has not gotten back to baseline in terms of pain and PO intake since at least June. Takes oxycodone 5 mg up to 5 times a day, sometimes taking a second pill if 5 mg not effective after an hour. At times is barely able to lie down, stand up straight, concentrate due to pain. Over past 2 weeks oxycodone effect did not last 4 hours. Dilaudid has been more effective here and she has been taking the IV dose and attempting to eat while it is working. Considers 3-4 on pain scale to be a tolerable level. Has discussed transitioning to butrans patch when able to reduce oxycodone usage, but hasn't been able to get there due to acute pain. Also hopeful for celiac plexus block.    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 Has supportive family and friends. Grandfather with whom she is close has recently been placed in hospice care.     Objective:       Function:  80% - Ambulation: Full / Normal Activity with effort, some evidence of disease / Self-Care:Full / Intake: Normal or reduced / Level of Conscious: Full    Temp:  [36.7 ??C (98.1 ??F)-36.8 ??C (98.2 ??F)] 36.7 ??C (98.1 ??F)  Heart Rate:  [67-80] 67  Resp:  [18] 18  BP: (103-118)/(52-77) 118/77  SpO2:  [98 %-100 %] 100 %    Physical Exam:  Constitutional: Sitting up in bed, NAD  Eyes: Anicteric sclera, no discharge  ENMT: Moist oral mucosa  Pulm: No increased work of breathing  CV: Distal extremities warm and well perfused  [name]: Mild sarcopenia  Skin: No apparent rashes or lesions  Neuro: cognitive status alert and conversant  Psych: mood and affect calm    Testing reviewed and interpreted:   Reviewed and interpreted test results for CBC, CMP, CT A/P affecting assessment of underlying illness severity and prognosis    I personally spent 90 minutes face-to-face and non-face-to-face in the care of this patient, which includes all pre, intra, and post visit time on the date of service.  All documented time was specific to the E/M visit and does not include any procedures that may have been performed.     See ACP Note from today for additional billable service:  No.

## 2021-12-11 LAB — BASIC METABOLIC PANEL
ANION GAP: 9 mmol/L (ref 5–14)
BLOOD UREA NITROGEN: 5 mg/dL — ABNORMAL LOW (ref 9–23)
BUN / CREAT RATIO: 8
CALCIUM: 9.2 mg/dL (ref 8.7–10.4)
CHLORIDE: 108 mmol/L — ABNORMAL HIGH (ref 98–107)
CO2: 24 mmol/L (ref 20.0–31.0)
CREATININE: 0.59 mg/dL — ABNORMAL LOW
EGFR CKD-EPI (2021) FEMALE: >90 mL/min/{1.73_m2} (ref >=60)
GLUCOSE RANDOM: 96 mg/dL (ref 70–179)
POTASSIUM: 3.8 mmol/L (ref 3.4–4.8)
SODIUM: 141 mmol/L (ref 135–145)

## 2021-12-11 LAB — T4, FREE: FREE T4: 0.79 ng/dL — ABNORMAL LOW (ref 0.89–1.76)

## 2021-12-11 LAB — TSH: THYROID STIMULATING HORMONE: 1.409 u[IU]/mL (ref 0.550–4.780)

## 2021-12-11 MED ORDER — COURIERED MED OR SUPPLY
0 refills | 0 days
Start: 2021-12-11 — End: ?

## 2021-12-11 MED ADMIN — HYDROmorphone (DILAUDID) tablet 3 mg: 3 mg | ORAL | @ 03:00:00 | Stop: 2021-12-24

## 2021-12-11 MED ADMIN — ondansetron (ZOFRAN) injection 4 mg: 4 mg | INTRAVENOUS | @ 09:00:00

## 2021-12-11 MED ADMIN — ondansetron (ZOFRAN) injection 4 mg: 4 mg | INTRAVENOUS | @ 03:00:00

## 2021-12-11 MED ADMIN — HYDROmorphone (PF) (DILAUDID) injection 1 mg: 1 mg | INTRAVENOUS | @ 05:00:00 | Stop: 2021-12-19

## 2021-12-11 MED ADMIN — HYDROmorphone (PF) (DILAUDID) injection 1 mg: 1 mg | INTRAVENOUS | @ 17:00:00 | Stop: 2021-12-19

## 2021-12-11 MED ADMIN — diclofenac sodium (VOLTAREN) 1 % gel 2 g: 2 g | TOPICAL | @ 22:00:00

## 2021-12-11 MED ADMIN — gabapentin (NEURONTIN) capsule 100 mg: 100 mg | ORAL | @ 03:00:00

## 2021-12-11 MED ADMIN — HYDROmorphone (PF) (DILAUDID) injection 1 mg: 1 mg | INTRAVENOUS | @ 09:00:00 | Stop: 2021-12-19

## 2021-12-11 MED ADMIN — polyethylene glycol (MIRALAX) packet 17 g: 17 g | ORAL | @ 13:00:00

## 2021-12-11 MED ADMIN — thiamine mononitrate (vit B1) tablet 100 mg: 100 mg | ORAL | @ 13:00:00 | Stop: 2021-12-17

## 2021-12-11 MED ADMIN — mirtazapine (REMERON) tablet 7.5 mg: 7.5 mg | ORAL | @ 03:00:00

## 2021-12-11 MED ADMIN — HYDROmorphone (PF) (DILAUDID) injection 1 mg: 1 mg | INTRAVENOUS | @ 13:00:00 | Stop: 2021-12-19

## 2021-12-11 MED ADMIN — levoFLOXacin (LEVAQUIN) tablet 500 mg: 500 mg | ORAL | @ 13:00:00 | Stop: 2021-12-11

## 2021-12-11 MED ADMIN — HYDROmorphone (PF) (DILAUDID) injection 1 mg: 1 mg | INTRAVENOUS | @ 01:00:00 | Stop: 2021-12-19

## 2021-12-11 MED ADMIN — ondansetron (ZOFRAN) injection 4 mg: 4 mg | INTRAVENOUS | @ 15:00:00

## 2021-12-11 MED ADMIN — metoclopramide (REGLAN) oral solution: 2.5 mg | ORAL | @ 22:00:00

## 2021-12-11 MED ADMIN — HYDROmorphone (DILAUDID) tablet 3 mg: 3 mg | ORAL | @ 19:00:00 | Stop: 2021-12-24

## 2021-12-11 MED ADMIN — lactated Ringers infusion: 10 mL/h | INTRAVENOUS | @ 01:00:00

## 2021-12-11 MED ADMIN — OLANZapine (ZyPREXA) tablet 1.25 mg: 1.25 mg | ORAL | @ 03:00:00

## 2021-12-11 MED ADMIN — HYDROmorphone (PF) (DILAUDID) injection 1 mg: 1 mg | INTRAVENOUS | @ 22:00:00 | Stop: 2021-12-19

## 2021-12-11 MED ADMIN — diclofenac sodium (VOLTAREN) 1 % gel 2 g: 2 g | TOPICAL | @ 01:00:00

## 2021-12-11 MED ADMIN — famotidine (PEPCID) tablet 20 mg: 20 mg | ORAL | @ 03:00:00

## 2021-12-11 MED ADMIN — ondansetron (ZOFRAN) injection 4 mg: 4 mg | INTRAVENOUS | @ 22:00:00

## 2021-12-11 MED FILL — COURIERED MED OR SUPPLY: 1 days supply | Qty: 1 | Fill #0

## 2021-12-11 NOTE — Unmapped (Signed)
Pt A&Ox4, VS WDL, KVO LR continuous running. Complains of pain was given prns. Had several watery Bms overnight.   Problem: Adult Inpatient Plan of Care  Goal: Plan of Care Review  Outcome: Progressing  Goal: Patient-Specific Goal (Individualized)  Outcome: Progressing  Goal: Absence of Hospital-Acquired Illness or Injury  Outcome: Progressing  Intervention: Prevent and Manage VTE (Venous Thromboembolism) Risk  Recent Flowsheet Documentation  Taken 12/10/2021 2000 by Zenaida Niece, RN  Activity Management: activity adjusted per tolerance  Goal: Optimal Comfort and Wellbeing  Outcome: Progressing  Goal: Readiness for Transition of Care  Outcome: Progressing  Goal: Rounds/Family Conference  Outcome: Progressing     Problem: Pain Acute  Goal: Acceptable Pain Control and Functional Ability  Outcome: Progressing       Problem: Latex Allergy  Goal: Absence of Allergy Symptoms  Outcome: Progressing     Problem: Oral Intake Inadequate  Goal: Improved Oral Intake  Outcome: Progressing     Problem: Infection  Goal: Absence of Infection Signs and Symptoms  Outcome: Progressing

## 2021-12-11 NOTE — Unmapped (Addendum)
Medicine Daily Progress Note    Assessment/Plan:  Principal Problem:    Intractable abdominal pain  Active Problems:    [name]    Intestinal polyps    Dehydration    Physical deconditioning    Severe protein-calorie malnutrition (CMS-HCC)    Winged scapula of right side    Lower abdominal pain    Generalized anxiety disorder with panic attacks  Resolved Problems:    * No resolved hospital problems. *         [name] [name] is a 21 y.o. 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 So far workup has been reassuring against obstruction or pouchitis, but she continues to have severe pain limiting eating and requiring IV pain medications.    Acute on chronic abdominal pain  - GI surgery consulted - have signed off  - Chronic pain consulted - they will sign off. Discussed celiac plexus block, but after discussion with pain team, unlikely to be helpful at this time. If she undergoes future EGD, GI can consider doing at that time to see if it helps.  - GI medicine consulted, s/p pouchoscopy with small ulcer   - levofloxacin to prevent gut translocation of bacteria per GI recs. Recommend 5 days (complete 12/11/21) - stop today  - ondansetron 4mg  q6 hours scheduled   - Bowel regimen: miralax TID. Started reglan 2.5mg  po TID prior to meals   - Palliative care consulted to assist with pain and symptom management  - current pain control:    - dilaudid po 3mg  q4 hours prn    - dilaudid IV 1mg  q3 hours prn (po first, then IV if pain not controlled)  - schd tylenol  - voltaren gel  - lidocaine patch  - gabapentin 300mg  nightly   - prn toradol (has not used)    [name], [name] and Desmoid fibromatosis  Follows with [facility] Dr [name] as well as by [facility] oncology palliative care (Dr Harrie Foreman) for pall care sx and pain management. They have been planning on starting sorafenib in coming weeks.  - sorafenib authorization obtained. Will start inpatient today 10/4    Anxiety  Follows with psychiatry and psychology outpatient. They have seen her inpatient   - increase zyprexa to 2.5mg  nightly per psychiatry  - hydroxyzine 25 mg po q6h PRN itching or anxiety  - mirtazapine 7.5mg  at bedtime     Severe protein-calorie malnutrition  BMI 19 and has lost 8 lb in the last 1 month  - regular diet  - calorie count  - daily weights   - Ensure Plus High Protein BID   - thiamine 100mg  daily x7 doses   - Nutrition re-evaluating. They are recommending trialing trickle tube feeds given inability to tolerate po intake and risk for malnutrition. Discussed with GI yesterday - recommending holding off on NGT feeds at this time and encouraging po intake    DVT ppx: Lovenox      Diet: Regular    Code Status / HCDM   - Full Code, Discussed with patient at the time of admission   - HCDM [facility] policy, no known patient preference): Sikora,[name] - Mother - [phone]    HCDM [facility] policy, no known patient preference): [name] - Father - [phone]    [ ]  Anticipated Discharge Location: Home  [ ]  PT/OT/DME: No needs anticipated  [ ]  CM needs: Home  Health - asking about PT/OT and DME (warming blanket thing)  [ ]  SW needs: None  Will place SW consult order if needed.  [ ]  Teaching: None anticipated  [ ]  Follow up appt: Appt needed  [ ]  Estimated Date of Discharge updated for patients discharging within 48 hours.    >50% of 70 minutes spent in coordination of care with nutrition, psychiatry, gastroenterology, palliative care, updating family and evaluating patient, reviewing prior records  _____________________________________________    Subjective:  Transitioned from oxycodone to oral dilaudid yesterday and she received 2 prns. Received 2 IV dilaudid doses overnight for severe pain.    Labs/Studies:  Labs and Studies from the last 24hrs per EMR and Reviewed    Objective:  Temp:  [36.6 ??C (97.9 ??F)-36.8 ??C (98.2 ??F)] 36.6 ??C (97.9 ??F)  Heart Rate:  [67-93] 93  Resp:  [16-20] 20  BP: (111-118)/(67-82) 111/82  SpO2:  [97 %-100 %] 97 %    GEN: sitting in bed, in NAD  ENT: MMM and pink  CV: RRR.  2+ pulses. No edema  PULM: breathing appears comfortable  ABD: soft, not significantly distended, tenderness to palpation   EXT: No edema  SKIN: bruising over bilateral medial thighs does not appear worse than prior, some evolution of the colors appears more old.  NEURO: A&O x 3

## 2021-12-11 NOTE — Unmapped (Signed)
Adolescent and Young Adult Cancer Program Visit     Service date: December 03, 2021    Encounter location: Multidisciplinary Clinic    Clinician: [name], LCSW - AYA Clinical Social Worker    Patient identifiers: [name] [name] is a 21 y.o. with desmoid tumors. [name] [name] uses she/her pronouns.     Additional visit participants: parents, Dr. [name]    Visit Summary     Met with the patient for evaluation of needs that may be addressed by [facility] during clinic visit with Dr. [name].    Spent approx 45 minutes with [name] [name] one-on-one following clinic visit in supportive counseling and collaborative care coordination around how she can continue to advocate for herself in her care.    [name] endorsed worsening anxiety and depressive symptoms over the past month. Discussed possible contributing factors including pain, poor nutrition, poor sleep, changes in functional status due to above, and changes in medication.    Follow Up/Plan:     Made a plan to talk next week via video for counseling. Contacted Dr. Malcolm Metro about increased depression and anxiety.    I will continue to follow this patient for AYA-appropriate support. They have my contact information and have been encouraged to contact me as needed.     12/11/2021     [name], LCSW  Adolescent and Young Adult Clinical Social Worker  Phone: [phone]  Email: [email or web address]

## 2021-12-11 NOTE — Unmapped (Signed)
Gastroenterology (Luminal) Consult Service   Progress Note         Assessment & Plan:   [name] [name] is a 42F Hx [name] (pt of Dr. [name]) s/p IPAA p/w explosive and frequent stools and n/v/inability to tolerate PO. On empiric levaquin given dilated loops of bowel on CTAP to prevent bacterial translocation. S/p Pouchoscopy 12/10/2021 with normal mucoca of both efferent and afferent limb, no evidence of stricture. Two small ulcers were found at anastomosis and in efferent limb without evidence of bleeding.     Patient continues to have severe abdominal pain that is likely multifactorial. She has known desmoid tumors which could be contributing and she also likely has a motility disorder which is exacerbated by opioids. She has been unable to tolerate PO given severe nausea and abdominal pain. She is concerned about her ongoing weight loss and would like to have an NGT placed for feeding. We discussed the fact that the NGT tube feeds would likely result in the same nausea and pain and that we should try to medically manage her symptoms.     Discussed the celiac plexus block with [facility] attending Dr. Iran Planas. After review of the CT scan and location of desmoid tumors, do not believe that patient would benefit symptomatically from a celiac block. Given low likelihood of improvement with the procedure, would not pursue celiac plexus block with GI.     Regarding her poor weight, poor oral intake tolerance and chronic nausea, these have been long-term symptoms that the patient has been dealing with at least since 2019, at which point in time Dr. Cheron Schaumann documented in his note that her lowest weight had been 107lbs, and in 2019 with these symptoms was at 111 pounds (equivalent to her current weight).   She does not have any GI obstruction or severe GI motility disorder.  She did take total parenteral nutrition twice on our review of the records: from 6/22-7/01, shortly after her total proctocolectomy managed by surgery, and then following an admission at [facility] for small bowel obstruction from 12/22-3/23.  Her TPN was complicated by deep venous thrombosis to the upper extremity requiring anticoagulation.  The known indications for tube feeding include gastrointestinal obstruction, severe neurologic dysphagia, and gastroparesis refractory to medical management, none of which the patient has.  We are not aware of any clinical trials showing improved outcomes in patients with chronic nausea and poor apettite who receive tube feeding.  We would like to continue to optimize medical management of her symptoms and encourage her to eat orally.  We have reached out to nutritionist Mr. Nolon Stalls to discuss his recommendations.    Recommendations:  Miralax as tolerated  Can try liquid reglan 2.5 mg prior to eating to help with nausea and motility  Agree with palliative care in switching to a scheduled PO pain medications and trying to avoid IV pain medications  Continue small frequent meals  Chemotherapy of desmoid tumors per oncology    I have communicated recommendations with the patient's primary team    Thank you for involving [name] in the care of your patient. We will continue to follow along with you.     For questions, contact the on-call fellow for the Gastroenterology (Luminal) Consult Service at [phone].     Dr. Caryn Section fellow    Aleatha Borer, MD MSc  Associate Professor, Gastroenterology and Hepatology  [place] [facility] [place] [facility]      Interval History:   Received  pepcid, zofran x 4, Miralax x 1. Tolerated Ensures. Received dilaudid IV x 6 mg, dilaudid PO 6 mg. Per palliative care, recommended started dilaudid 3mg  PO q4h PRN, gapabentin, lidocaine patch, reglan 2.5 mg PO q8 hr.    Overnight had 3 watery bowel movements and had sharp abdominal pain afterwards. Has also been unable to take in much food because of abdominal pain and severe nausea that occurs with eating. Says she would like to gain 15 lbs and is concerned about becoming increasingly weak.     Objective:   Temp:  [36.6 ??C (97.9 ??F)-36.9 ??C (98.4 ??F)] 36.9 ??C (98.4 ??F)  Heart Rate:  [86-93] 86  Resp:  [16-20] 20  BP: (111-118)/(67-82) 118/70  SpO2:  [97 %-98 %] 98 %    Gen: WDWN in NAD, answers questions appropriately  Abdomen: Soft, NTND.   Extremities: No edema in the BLEs    Pertinent Labs/Studies Reviewed:   CBC normal, CMP with elevated ALT. Prealbumin 13.9

## 2021-12-11 NOTE — Unmapped (Signed)
Lower GI Surgery Consult Note    Requesting Attending Physician:  Andres Shad, MD  Service Requesting Consult:  Med Bernita Raisin [facility])  Service Providing Consult: Lower GI Surgery  Consulting Attending: Marcella Dubs, MD    Assessment:  [name] [name] is a 21 y.o. female with PMHx of [name], [name], dermoid fibromatosis s/p  robotic proctocolectomy with IPAA and DLI (08/10/20 - Sadiq) and ileostomy take down (10/09/20 -Sadiq) and desmoid tumors who was seen in consultation for abdominal pain and emesis, concerning for small bowel obstruction.   Admission non-con Abdominopelvic CT demonstrated postsurgical changes including proctocolectomy and ileoanal anastomosis, stool present in the ileal pouch, several mesenteric soft tissue lesions with infiltrative features again favored to reflect desmoid. Contrasted CT Abdomen/Pelvis re-demonstrated the mesenteric desmoid tumors and showed enteric contrast opacifying small bowel through the ileoanal anastomosis without evidence of bowel obstruction. Prior MRI Pelvis (9/14) showed multiple unchanged mesenteric desmoids tumors with bowel tethering, no evidence of obstruction and prominent stool burden in the ileal pouch.     Subjective/Interval Events:   NAEO. VSS. Patient tolerated pouchoscopy yesterday without significant issue. Findings were notable for 3 mm ulceration in the efferent limb of the j-pouch, otherwise there was no stricture or evidence of pouchitis. Patient has been able to tolerate small volume of regular diet and reports one watery bowel movement yesterday. She denies dyschezia, blood per rectum, mucus per rectum, fever and chills.     PLAN:   - Luminal GI consulted; pouchoscopy (10/2) negative for stricture or active pouchitis   - Advance diet as tolerated   - Recommending BID soap enemas    - Recommending 5 day course of Levaquin (9/30- )  - Hematology/Oncology consulted; considering start of systemic therapy in the future (sorafenib) in setting of worsening abdominal pain.  - We will sign off at this time, please call with any questions.    If you have any questions, concerns or changes in the patient's clinical status, please feel free to contact [facility] consult pager [phone].    Objective  Vitals:   Temp:  [36.7 ??C (98.1 ??F)-36.8 ??C (98.2 ??F)] 36.8 ??C (98.2 ??F)  Heart Rate:  [67-87] 87  Resp:  [16-18] 16  BP: (103-118)/(52-77) 117/67  MAP (mmHg):  [67-90] 90  SpO2:  [97 %-100 %] 97 %      Intake/Output last 24 hours:    Intake/Output Summary (Last 24 hours) at 12/10/2021 1703  Last data filed at 12/10/2021 0900  Gross per 24 hour   Intake 120 ml   Output --   Net 120 ml       Physical Exam:  General: Well-appearing, resting comfortably in no acute distress  Eyes: Sclera anicteric, EOM intact  ENT: Nares without discharge, dry mucous membranes, trachea midline   Cardiac: Regular rate and rhythm, no appreciable murmurs   Pulmonary: Non labored breathing, stable on RA, lungs clear to auscultation bilaterally   Abdomen: Soft, left sided tenderness, non distended, not peritonitic  Extremities: Warm and well perfused, no edema bilaterally   Neuro: Alert and oriented x3    Pertinent Diagnostic Tests:  All lab results last 24 hours:    No results found for this or any previous visit (from the past 24 hour(s)).      Imaging:  No results found.

## 2021-12-11 NOTE — Unmapped (Cosign Needed)
[facility]  Follow-Up Psychiatry Consult Note     Service Date: December 11, 2021  LOS:  LOS: 5 days      Assessment:   [name] [name] is a 21 y.o. 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  Patient was seen in consultation by Psychiatry at the request of Andres Shad, MD with [facility]) for evaluation of Anxiety.     Patient presents with diagnosis/diagnoses of generalized anxiety disorder based on the presence of multiple worries that are difficult to control, sleep disturbance, fatigue/low energy, and impaired concentration/mind going blank. That said, fatigue and sleep disturbance may also be attributable to physical symptoms, and adjustment disorder with anxiety is also a diagnostic consideration.   Of note, she does experience panic attacks but has not been found to meet full criteria for the disorder, though assessment is ongoing.     Updates 12/11/2021:  She reports feeling generally [name] for the past two days, which she thinks may be due to starting olanzapine but could also be due to improvement in pain control and better sense of direction with her medical plan.  She is open to further increase of olanzapine.      Please see below for detailed recommendations.    Diagnoses:   Active Hospital problems:  Principal Problem:    Intractable abdominal pain  Active Problems:    [name]    Intestinal polyps    Dehydration    Physical deconditioning    Severe protein-calorie malnutrition (CMS-HCC)    Winged scapula of right side    Lower abdominal pain    Generalized anxiety disorder with panic attacks       Problems edited/added by me:  No problems updated.      Safety Risk Assessment:  There was a psychiatric evaluation with full risk assessment performed on 12/09/21. At this time there is no change in risk, patient remains at low risk of suicide/ harm to self and at low risk of harm to others.       Recommendations:   ## Safety and Observation Level:   -- Based on the BSA or psychiatric evaluation, we estimate the patient to be at low risk for suicide in the current setting. We recommend routine level of observation on medical unit. This decision is based on my review of the chart including patient's history and current presentation, interview of the patient, mental status examination, and consideration of suicide risk including evaluating suicidal ideation, plan, intent, suicidal or self-harm behaviors, risk factors, and protective factors. This judgment is based on our ability to directly address suicide risk, implement suicide prevention strategies and develop a safety plan while the patient is in the clinical setting.   -- If the patient attempts to leave against medical advice and it is felt to be unsafe for them to leave, please call a Behavioral Response and page Psychiatry at [phone].  -- Please contact our team if there is a concern that risk level has changed.    ## Medications:   -- INCREASE olanzapine to 2.5mg  for anxiety/sleep/appetite  -- Continue mirtazapine 7.5mg  for now  -- Continue hydroxyzine 25mg  PRN q6 for acute anxiety and add indication for insomnia    ## Medical Decision Making Capacity:   -- A formal  capacity assessement was not performed as a part of this evaluation.  If specific capacity questions arise, please contact our team as below.     ## Further Work-up:   -- No recommendations at this time.    ## Disposition:   -- When patient is discharged, please ensure that their AVS includes information about the 3 Suicide & Crisis Lifeline.  -- The patient will follow-up with Dr. Kathrine Haddock in the [facility] psych-onc clinic for mental health care at the time of discharge.    ## Behavioral / Environmental:   -- As much as possible, allow the patient to choose between two directives to promote sense of autonomy  -- Empathize with feelings and validate concerns    Thank you for this consult request. Recommendations have been communicated to the primary team.  We will follow as needed at this time. Please page [phone] for any questions or concerns.     Discussed with  Attending, [name], MD, who agrees with the assessment and plan.    [name], MD      Interval History:   Relevant events since last seen by psychiatry:   - Seen by primary/outpatient oncology team; plan is to start sorafenib inpatient  - Pain regimen adjusted by palliative care/chronic pain  - Nutrition evaluated patient, consideration of NG tube placement is being discussed    Patient Interview:  [name] [name] is seen at bedside along with her mother.  She consents to having her mother remain with her during the interview.    [name] [name] since initiation of olanzapine.  She reports that things are also better in general, so she is not sure whether she feels better because of the medication or because of external circumstances.  She was relieved to see Dr. [name] and [name] yesterday, and is feeling better about her current pain regimen.      She is open to titration of olanzapine.  Both [name] and her mother ask questions about drug-drug interactions and serotonin syndrome, and their questions were answered.      ROS:   All systems reviewed as negative/unremarkable aside from the following pertinent positives and negatives: as per patient interview    Collateral:   - Reviewed medical records in Epic  - Reviewed medical records via CareEverywhere  - Spoke to patient's mother, as mentioned above.  She agrees that [name] [name] has been significantly [name] the last two days    Relevant Updates to past psychiatric, medical/surgical, family, or social history: none    Current Medications:  Scheduled Meds: diclofenac sodium  2 g Topical QID    enoxaparin (LOVENOX) injection  30 mg Subcutaneous Q24H    famotidine  20 mg Oral Nightly    gabapentin  100 mg Oral Nightly    lidocaine  1 patch Transdermal Q24H    mirtazapine  7.5 mg Oral Nightly    OLANZapine  1.25 mg Oral Nightly    ondansetron  4 mg Intravenous Q6H    polyethylene glycol  17 g Oral TID    thiamine mononitrate (vit B1)  100 mg Oral Daily     Continuous Infusions:   lactated Ringers 10 mL/hr (12/10/21 2034)     PRN Meds:.baclofen, calcium carbonate, diphenhydrAMINE, hydrocortisone, HYDROmorphone, HYDROmorphone, hydrOXYzine, ketorolac     Objective:   Vital signs:   Temp:  [36.6 ??C (97.9 ??F)-37.1 ??C (98.8 ??F)] 37.1 ??C (98.8 ??F)  Heart Rate:  [86-93] 93  Resp:  [16-20] 17  BP: (  108-118)/(67-82) 108/69  MAP (mmHg):  [81-89] 81  SpO2:  [97 %-98 %] 97 %    Physical Exam:  Gen: Mild acute distress initially observed, this lessens during the interview  Pulm: Normal work of breathing.  Neuro/MSK: moving all four limbs spontaneously.  Skin: grossly normal; no erythema.    Mental Status Exam:  Appearance:   Thin young woman, neatly groomed, wearing leopard-patterned nightgown/dress, sitting in bed    Attitude:   calm and polite   Behavior/Psychomotor:  appropriate eye contact and no abnormal movements   Speech/Language:    Soft volume, clear and coherent.  Normal amount and prosody.    Mood:  ???[name]   Affect:   Euthymic vs. Mildly dysthymic   Thought process:  logical, linear, clear, coherent, goal directed   Thought content:    denies thoughts of self-harm. Denies SI, plans, or intent. Denies HI.  No grandiose, self-referential, persecutory, or paranoid delusions noted.   Perceptual disturbances:   behavior not concerning for response to internal stimuli   Attention:  able to fully attend without fluctuations in consciousness   Concentration:  Able to fully concentrate and attend   Orientation:  grossly oriented.   Memory:  not formally tested, but grossly intact Fund of knowledge:   not formally assessed   Insight:    Fair   Judgment:    Good   Impulse Control:  Intact       Data Reviewed:  I reviewed labs from the last 24 hours.  I reviewed imaging reports from the last 24 hours.     Additional Psychometric Testing:  Not applicable.    Consult Type and Time-Based Documentation:  This patient was evaluated in person.    Time-based billing disclaimer:  I personally spent 45   minutes face-to-face and non-face-to-face in the care of this patient, which includes all pre, intra, and post visit time on the date of service.  All documented time was specific to the E/M visit and does not include any procedures that may have been performed.

## 2021-12-11 NOTE — Unmapped (Signed)
Palliative Care Progress Note      Consultation from Requesting Attending Physician:  Andres Shad, MD  Primary Care Provider:  Jarold Motto, [facility]      Assessment/Plan:      SUMMARY:  [name] is seriously and acutely ill due to [name] s/p colectomy, desmoid tumors, complicated by co-morbid acute and chronic conditions including aneima, anxiety, chronic right shoulder pain, severe protein-calorie malnutrition, admitted with acute on chronic abdominal pain, palliative care consulted to assist with symptom management and support.       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 Worsens with eating - has only a short period after pain meds where she can eat. At home was taking oxy 5-10 mg up to 5x daily. Here, finds oxy gives only partial relief and relying more on IV dilaudid. Sees chronic pain outpatient - trigger point injections have helped significantly with R shoulder pain, planning for celiac plexus block and plan to eventually transition to butrans patch.  Feels PO dilaudid is significantly more effective than oxycodone was.   - Continue dilaudid 3 mg PO q4h PRN - discussed trying PO medication before IV  - Continue dilaudid 1 mg IV q4h PRN severe pain unrelieved by PO meds  - Increase gabapentin to 300 mg nightly  - Continue tylenol PO 1000 mg TID sch  - Continue lidocaine patch  - Heat, ice packs PRN     Nausea Currently controlled on IV zofran scheduled   - If nausea worsens, consider reglan 2.5 mg PO q8 hrs (appears to have been tolerated in the past and would be helpful from a pro-motility perspective)      Constipation   Recently has had softer stools, up to 5-7x daily is normal for her   - Management per GI/primary, agree with daily scheduled miralax to maintain loose stools      Weakness/deconditioning - related to long hospitalisations and malnutrition.   - Nutrition consulted, discussing supplementation to optimise nutritional status  - Would benefit from ongoing physical therapy given degree of deconditioning - consult PT inpatient for recs      Anxiety - seen by [name], AYA SW and psychiatry this admission  - Olanzapine 2.5mg  nightly  - Continue mirtazapine 7.5mg  nightly  - Continue hydroxyzine 25mg  PRN q6h for acute anxiety/insomnia    Goals of care / ACP:  Code Status:   Code Status: Full Code   Healthcare decision-maker if lacks capacity:   HCDM [employer] policy, no known patient preference): Boerema,[name] - Mother - [phone]    HCDM, back-up (If primary HCDM is unavailable): Canny,Jeff - Father - [phone]     Prognosis / prognostic understanding:  Appears to have good prognostic understanding     Current goals of care:  Looking forward to starting sorafenib, hopes chemotherapy will allow her to live as normal of a life as possible.            Practical, Emotional, Spiritual Support Recommendations:  Palliative care visit today included focused interview, active listening, therapeutic use of silence, offering support, sharing empathy and summarization of today's discussion.  - Seen today with GI and primary team      Recommendations shared with primary team in person        Thank you for this consult. Please contact  Dr Lanell Matar  or page Palliative  Care if there are any questions.   Palliative Care team plans to visit this patient again on 10/5     Subjective:     Recent Events:    Last night, had significant worsening of pain around 10-midnight. Had 3 bowel movements during this time, mostly water. Feels PO dilaudid works much better than oxycodone - 3 mg dose brought pain from 10 to 5. Did end up taking IV dilaudid about an hour later and 2 more doses since.   Discussed with nutrition today about possible NGT feeding - she is very concerned about nutritional status as she has been unable to eat well for a long time, 15 lb weight loss and had difficulty getting strength back last time she became this deconditioned. She is only able to tolerate 1/2 pediasure without exacerbating abd pain. Has hope that slow continuous feeds would be an option to try to optimise nutrition without worsening symptoms.       Objective:       Function:  80% - Ambulation: Full / Normal Activity with effort, some evidence of disease / Self-Care:Full / Intake: Normal or reduced / Level of Conscious: Full    Temp:  [36.6 ??C (97.9 ??F)-37.1 ??C (98.8 ??F)] 37.1 ??C (98.8 ??F)  Heart Rate:  [86-93] 93  Resp:  [16-20] 17  BP: (108-118)/(67-82) 108/69  SpO2:  [97 %-98 %] 97 %    Physical Exam:  Constitutional: Sitting up in bed, NAD  Eyes: Anicteric sclera, no discharge  ENMT: Moist oral mucosa  Pulm: No increased work of breathing  CV: Distal extremities warm and well perfused  MS: Mild sarcopenia  Skin: No apparent rashes or lesions  Neuro: cognitive status alert and conversant  Psych: mood and affect calm    Testing reviewed and interpreted:   None today    I personally spent 45 minutes face-to-face and non-face-to-face in the care of this patient, which includes all pre, intra, and post visit time on the date of service.  All documented time was specific to the E/M visit and does not include any procedures that may have been performed.   See ACP Note from today for additional billable service:  No.

## 2021-12-11 NOTE — Unmapped (Signed)
[facility]oles[facility] Visit     Service date: November 08, 2021    Encounter location: Virtual: Video    Clinician: [name], LCSW - AYA Clinical Social Worker    Patient identifiers: [name] [name] is a 21 y.o. with desmoid tumors. [name] [name] uses she/her pronouns.     Visit Summary     Met with the patient for scheduled supportive counseling.    Primarily discussed how school has been going. She endorses anxiety around test taking, but otherwise feels like school is going okay so far. Has been increasingly struggling to be active due to increasing abdominal pain. Team aware.    Follow Up/Plan:     [name] plans to message me to schedule a follow up visit.    I will continue to follow this patient for AYA-appropriate support. They have my contact information and have been encouraged to contact me as needed.       11/08/2021     [name], LCSW  Adolescent and Young Adult Clinical Social Worker  Phone: [phone]  Email: [email or web address]

## 2021-12-12 LAB — TISSUE TRANSGLUTAMINASE (TTG), IGA
TISSUE TRANSGLUTAMINASE ANTIBODY, IGA: <0.2 U/mL (ref <7)
TTG INTERPRETATION: NEGATIVE

## 2021-12-12 MED ADMIN — HYDROmorphone (PF) (DILAUDID) injection 1 mg: 1 mg | INTRAVENOUS | @ 13:00:00 | Stop: 2021-12-19

## 2021-12-12 MED ADMIN — ondansetron (ZOFRAN) injection 4 mg: 4 mg | INTRAVENOUS | @ 10:00:00

## 2021-12-12 MED ADMIN — HYDROmorphone (PF) (DILAUDID) injection 1 mg: 1 mg | INTRAVENOUS | @ 10:00:00 | Stop: 2021-12-19

## 2021-12-12 MED ADMIN — levoFLOXacin (LEVAQUIN) tablet 500 mg: 500 mg | ORAL | @ 22:00:00 | Stop: 2021-12-17

## 2021-12-12 MED ADMIN — ondansetron (ZOFRAN) injection 4 mg: 4 mg | INTRAVENOUS | @ 20:00:00

## 2021-12-12 MED ADMIN — acetaminophen (TYLENOL) tablet 1,000 mg: 1000 mg | ORAL | @ 19:00:00

## 2021-12-12 MED ADMIN — lidocaine (LIDODERM) 5 % patch 1 patch: 1 | TRANSDERMAL

## 2021-12-12 MED ADMIN — thiamine mononitrate (vit B1) tablet 100 mg: 100 mg | ORAL | @ 13:00:00 | Stop: 2021-12-17

## 2021-12-12 MED ADMIN — ondansetron (ZOFRAN) injection 4 mg: 4 mg | INTRAVENOUS | @ 02:00:00

## 2021-12-12 MED ADMIN — HYDROmorphone (DILAUDID) tablet 3 mg: 3 mg | ORAL | @ 19:00:00 | Stop: 2021-12-26

## 2021-12-12 MED ADMIN — HYDROmorphone (PF) (DILAUDID) injection 1 mg: 1 mg | INTRAVENOUS | @ 01:00:00 | Stop: 2021-12-19

## 2021-12-12 MED ADMIN — HYDROmorphone (PF) (DILAUDID) injection 1 mg: 1 mg | INTRAVENOUS | @ 16:00:00 | Stop: 2021-12-19

## 2021-12-12 MED ADMIN — HYDROmorphone (DILAUDID) tablet 3 mg: 3 mg | ORAL | Stop: 2021-12-24

## 2021-12-12 MED ADMIN — gabapentin (NEURONTIN) capsule 300 mg: 300 mg | ORAL | @ 02:00:00

## 2021-12-12 MED ADMIN — HYDROmorphone (PF) (DILAUDID) injection 1 mg: 1 mg | INTRAVENOUS | @ 20:00:00 | Stop: 2021-12-19

## 2021-12-12 MED ADMIN — diclofenac sodium (VOLTAREN) 1 % gel 2 g: 2 g | TOPICAL | @ 16:00:00

## 2021-12-12 MED ADMIN — acetaminophen (TYLENOL) tablet 1,000 mg: 1000 mg | ORAL | @ 02:00:00

## 2021-12-12 MED ADMIN — OLANZapine (ZyPREXA) tablet 2.5 mg: 2.5 mg | ORAL | @ 02:00:00

## 2021-12-12 MED ADMIN — HYDROmorphone (PF) (DILAUDID) injection 1 mg: 1 mg | INTRAVENOUS | Stop: 2021-12-19

## 2021-12-12 MED ADMIN — SORAfenib (NexAVAR) tablet 200 mg **PATIENT SUPPLIED**: 200 mg | ORAL | @ 13:00:00

## 2021-12-12 MED ADMIN — metoclopramide (REGLAN) oral solution: 2.5 mg | ORAL | @ 13:00:00 | Stop: 2021-12-12

## 2021-12-12 MED ADMIN — acetaminophen (TYLENOL) tablet 1,000 mg: 1000 mg | ORAL | @ 13:00:00

## 2021-12-12 MED ADMIN — HYDROmorphone (DILAUDID) tablet 3 mg: 3 mg | ORAL | @ 14:00:00 | Stop: 2021-12-12

## 2021-12-12 MED ADMIN — baclofen (LIORESAL) tablet 5 mg: 5 mg | ORAL

## 2021-12-12 MED ADMIN — mirtazapine (REMERON) tablet 7.5 mg: 7.5 mg | ORAL | @ 02:00:00

## 2021-12-12 MED ADMIN — HYDROmorphone (PF) (DILAUDID) injection 1 mg: 1 mg | INTRAVENOUS | @ 05:00:00 | Stop: 2021-12-19

## 2021-12-12 MED ADMIN — famotidine (PEPCID) tablet 20 mg: 20 mg | ORAL | @ 02:00:00

## 2021-12-12 MED ADMIN — ondansetron (ZOFRAN) injection 4 mg: 4 mg | INTRAVENOUS | @ 14:00:00

## 2021-12-12 NOTE — Unmapped (Signed)
[facility]  Department of Physical Medicine and Rehabilitation      [address] [place], [place] [address]  Phone: [phone] - Fax: [phone]     Neuropsychological Evaluation     Identifying Information     [name] [name] (pronouns: she/her/hers)   Medical Record #: [id number]   Date of Birth: 2000-06-06   Date of Evaluation: 12/02/2021     Contact Information:  [address]   [place], [place] [address]    Referred by:  Caleen Jobs. Silvestre Moment, NP [facility])     Clinician:  Serena Colonel, PhD, ABPP-CN, Board Certified Clinical Neuropsychologist  Rogers Blocker, PsyD, Neuropsychology Fellow  Lynnette Caffey, [place], Neuropsychology Practicum Student    Reason for Referral     [name] [name] is a 21 year old female with a history of familial adenomatous polyposis ([name]; Gardner's syndrome), desmoid fibromatosis, and deep vein thrombosis. She was referred for neuropsychological testing to assess her cognitive and emotional functioning, support treatment planning, and provide recommendations for academic support.     Key Findings     Note: Rather than discussing every score, the following is a synopsis of the most important findings from testing. Please refer to the appendix at the end of this report for a complete list of tests administered and their associated scores.    [name] [name] exhibited many areas of strength within her neuropsychological profile. She demonstrated intact performance in terms of processing speed and memory. Specifically, [name] [name] can actively learn and recall verbal information with repeated practice. She also demonstrated the ability to effectively learn and recall contextual information on immediate and delayed recall trials. [name] [name] also demonstrated strong skills in the areas of general intelligence, verbal comprehension, perceptual reasoning, and executive functioning skills in a quiet setting with minimal distractions.    [name] [name] also exhibited a few isolated but mild areas of weakness in this exam. On direct testing, [name] [name] showed occasional lapses in focus and concentration. She also described concerns regarding the amount of time required for her to learn new information and its impact on her academic performance. Her mother also noted mild difficulties with task initiation and organizing materials for [employer].    Conclusions     Overall, this is a very reassuring set of neuropsychological testing results for [name] [name]. Memory and executive functioning skills were intact in a one-on-one setting with minimal distractions, despite concerns about her ability to learn and retain new information when studying. As such, other factors are likely contributing to the difficulties she described.    [name] [name] has a longstanding history of associated with her academic performance, particularly with test-taking. More recently, her anxiety has increased with ongoing challenges related to her health and medical treatments. Current test data suggests that lapses in focus are most likely caused by her high levels of anxiety (disrupting focus), chronic pain and use of prescription opioids (causing brain fog), as well as poor nutrition and low sleep quality (causing decreased stamina and limiting mental exertion).    Ongoing academic accommodations, transition planning, and social support will serve [name] [name] well in maximizing her potential. Each of these pieces, in addition to her own strengths and significant family support, are very important to her overall success.     Diagnoses (with ICD-10 codes)     [name] (Q87.89)  [name] (D48.1)  Generalized anxiety disorder (F41.1)    Recommendations     Counseling:  [name] [name] should routinely participate in individual psychotherapy to address her emotional distress  and anxiety, particularly around her health and academic performance.     Psychiatry:  Please discuss the results of this evaluation with your psychiatrist to determine whether any adjustments to your current regimen in needed.     Medical / Physical:  Consult with your care team about options to increase quality of sleep. Given sleep is essential and has significant impacts on mood and cognition, consideration of medication to aid sleep, should other methods (e.g., practicing good sleep hygiene) remain ineffective.   Consider consultation with a nutritionist or dietician. [name] [name]'s low caloric intake and weight loss are areas of concern that should be addressed promptly.     Educational/Occupational:  It is recommended that [name] [name] look into tutoring at school, in addition to continuing to utilize the accommodations that have been provided through [employer] Disability [employer]ommodation Support (ODAS). Talking through new information while studying and reviewing notes may aid in encoding and reduce some of [name] [name]'s anxiety regarding her preparedness for exams.  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  Consider realistic accommodation needs and the ideal work environment for the future with the goal of establishing a healthy professional/personal life balance and avoiding burnout. Consider looking into the Job [employer]ommodation [employer] (JAN), which provides listings of accommodations by disability, topic, and limitation: https://dunlap.com/.   With [name] [name]'s current medical needs and pharmacological treatment, additional safety plans and precautions should be taken around her on-campus housing while taking pain medications (e.g., more frequent check-ins and/or monitoring to ensure she is safe).     Follow-Up:  Neuropsychological re-evaluation is recommended if there are significant changes in [name] [name]'s functioning. Please contact us with questions and/or concerns as they arise.    Thank you for the opportunity to be involved in [name] [name]'s care. If there are any questions, please contact me by e-mail [email or web address] or by phone [phone]). The information that follows is a more comprehensive overview of the history and test results from this evaluation.    Serena Colonel, PhD, ABPP-CN  Licensed Psychologist [phone])  Board Certified Clinical Neuropsychologist        Details from Neuropsychological Evaluation    Evaluation Procedures     [name] [name] participated in neuropsychological testing in-person on 12/02/2021. [name] [name] gave verbal informed consent for evaluation after reviewing the purpose of testing and limits of confidentiality by law.     Test Administration:   Current COVID-19 policies required that clinicians each wear face masks during this in-person visit. [name] [name] opted not to wear a face mask, per current guidelines. The use of PPE was not a significant factor in test administration or procedures.    Tests Administered:   The following measures were completed using standard test administration procedures:  [employer], Third Edition (CVLT-3)*  Futures trader (D-KEFS); selected subtests*  Wechsler Adult Intelligence Scale, Fourth Edition (WAIS-IV)  Wechsler Abbreviated Scale of Intelligence, Second Edition Civil Service fast streamer)  Wechsler Memory Scale, Fourth Edition (WMS-IV); selected subtests*    Note: Tests marked with an asterisk (*) were administered digitally using Q-interactive or MHS+ Online platforms via iPad.    Presenting Problems     Memory: [name] [name] reported that she struggles to retain and recall information as needed, particularly with respect to academics. She noted that she requires approximately 25-30 hours of studying for each exam, has to review  information over and over again, and feels that it takes longer for her to learn new information than expected.    Anxiety and Mood: [name] [name] has also experienced adjustment-related mood issues secondary to her medical history, including anxiety and depressive symptoms, per medical record.     Relevant History      Medical:   [name] [name]'s medical history is significant for familial adenomatous polyposis ([name]; Gardner's syndrome), desmoid fibromatosis, and deep vein thrombosis. Per medical record, she has undergone multiple cryoablation and steroid injections to desmoid tumors and mass excisions. She recently underwent prophylactic proctocolectomy and ileostomy reversal for [name]. [name] [name] is not currently on chemotherapy, previously on Sorafenib w/significant toxicity.     Family / Social:  [name] [name] resides between two residences at home and at school. At home, she lives with her parents and older sister in [place], [place]. [name] [name] attends [employer], where she resides in an off-campus apartment with a roommate. [name] [name] noted that her current boyfriend and friend group provide additional social support. [name] [name]'s ongoing medical needs and the illness of her grandfather are primary stressors at this time. [name] [name] is independent with activities of daily living; however, due to her current medication regimen, she is unable to drive independently at this time.     Psychiatric:   [name] [name] has a history of brief therapeutic interventions provided to address various concerns, including anxiety, pain and stress management. [name] [name] was previously engaged in fairly consistent treatment; however, this was discontinued due to challenges with scheduling. Per her mother, [name] [name] has always experienced notable anxiety surrounding her performance in school, particularly with test-taking, and tends to put significant pressure on herself to succeed. Recently, [name] [name]'s anxiety surrounding her health has reportedly increased.      Educational:  [name] [name] is enrolled at [employer], [place]) where she is a double major in nursing and psychology, with plans to become a registered nurse (RN) or child life specialist (CLS). Due to limitations imposed by her medical conditions and treatment, [name] [name] is currently enrolled part-time and is receiving accommodations through the Engineer, petroleum (ODAS) at [employer].     Accommodations currently allow [name] [name] to complete the lecture portion of her classes first and the clinical portion at a later time. Prior to this semester, [name] [name] was receiving minimal support from school and had to withdraw from her program before re-entering. She expects to complete her program and graduate in December of 2024. 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       Behavioral Observations     [name] [name] was evaluated over one testing session, to which she was accompanied by her mother. She presented as casually dressed and well-groomed. She appeared her stated age. Rapport was easily established and maintained. As the testing session progressed, she displayed a cooperative manner and a well-developed sense of humor. Her mood was cheerful, and her affect was congruent.     Eye contact was well-integrated with her interactions. She was alert and oriented. Gait and motor coordination issues were not observed. [name] [name] thought process was logical and goal-directed. There were no signs of delusions, hallucinations, or perceptual difficulties.     [name] [name]'s comprehension of questions and directions was adequate, and she attended to the task-at-hand without difficulty. [name] [name] displayed good task persistence, attempting most tasks without difficulty.  Her frustration tolerance was appropriate.       Validity of Testing  [name] [name] was cooperative with testing activities and did not show signs of low motivation or disengagement with testing. She passed embedded measures of performance validity suggesting no obvious signs of poor effort during testing (CVLT-3 Forced-Choice Recognition: 16/16 correct; WAIS-IV Reliable Digit Span = 8). As such, the current results are likely an accurate and valid estimate of [name] [name]'s functioning in the areas assessed.    Test Results      Note: Test performances were compared to normative data from individuals the same age. The following scores should not be considered without the context from the interpretive section in the Key Findings section of this report. Scores from testing are expressed as follows:     Standard   Score (SS)  Scaled   Score (ScS)  T-Scores   (T)  Percentile   Rank (%ile)  Classification   Range    >130  >16  >70 >98  Exceptionally High   120-129  14-15 63-69 91-97 Above Average   110-119 12-13 57-62 75-90 High Average   90-109  8-11  43-56 25-74 Average    80-89  6-7 37-42 9-24 Low Average   70-79  4-5 30-36 2-8 Below Average   <70 1-3 <29 <2 Exceptionally Low     Intellectual Functioning   WASI-II Full Scale IQ SS=110 High Average        WASI-II Verbal Comprehension Index SS=109 Average       Similarities  T=56 Average       Vocabulary  T=55 Average        WASI-II Perceptual Reasoning Index SS=110 High Average       Block Design  T=58 High Average       Matrix Reasoning  T=54 Average        WAIS-IV Working Memory Index SS=86 Low Average       Digit Span ScS=6 Below Average       Arithmetic  ScS=9 Average        WAIS-IV Processing Speed Index SS=97 Average       Symbol Search  ScS=8 Average       Coding ScS=11 Average     Executive Functioning    D-KEFS Color-Word Interference Test         Color Naming  ScS=7 Low Average       Word Reading ScS=7 Low Average       Inhibition ScS=11 Average       Inhibition/ Switching ScS=11 Average        D-KEFS Verbal Fluency Test         Letter Fluency Total Correct ScS=6 Below Average       Category Fluency Total Correct ScS=12 Average       Category Switching Total Correct ScS=15 Above Average       Category Switching Total Switching Accuracy  ScS=15 Above Average        D-KEFS Trail Making         Visual Scanning ScS=10 Average       Number Sequencing ScS=13 High Average       Letter Sequencing  ScS=10 Average       Number-Letter Sequencing ScS=10 Average     Behavior Rating Inventory of Executive Function, Adult Version (BRIEF-A): Self-Report   Scale T-Scores Percentile    Inhibit T=36 (WNL) 9   Shift T=51 (WNL) 64   Emotional Control T=47 (WNL) 45   Self-Monitor T=37 (WNL) 16   Initiate T=43 (WNL) 31   Working Memory T= 46 (WNL)  50   Plan / Organize T= 44 (WNL) 37   Task-Monitor T=50 (WNL) 56   Organization of Materials T=50 (WNL) 57   Note: Single asterisk (*) = mild to moderate elevated scores. A double asterisk (**) indicates clinically significant levels of concerns. The acronym WNL stands for within normal limits.    Behavior Rating Inventory of Executive Function, Adult Version (BRIEF-A): Informant Report   Scale T-Scores Percentile    Inhibit T=39 (WNL) 12   Shift T=49 (WNL) 60   Emotional Control T=48 (WNL) 56   Self-Monitor T=40 (WNL) 28   Initiate T=60* 87   Working Memory T=45 (WNL) 50   Plan / Organize T=51 (WNL) 64   Task-Monitor T=45 (WNL) 46   Organization of Materials T=63* 85   Note: Single asterisk (*) = mild to moderate elevated scores. A double asterisk (**) indicates clinically significant levels of concerns. The acronym WNL stands for within normal limits.    Memory:    California Verbal Learning Test, 3rd Edition Scaled Score Range     Trial 1 Free Recall Total Correct 10 Average     Trial 2 Free Recall Total Correct 9 Average     Trial 3 Free Recall Total Correct 9 Average     Trial 4 Free Recall Total Correct 11 Average      Trial 5 Free Recall Total Correct 10 Average      List B Free Recall Total Correct 7 Below Average      Short Delay Free Recall Total Correct 11 Average      Short Delay Cued Recall Total Correct 13 High Average      Long Delay Free Recall Total Correct 12 Average      Long Delay Cued Recall Total Correct 12 Average      Long Delay Yes/No Recognition Hits 7 Low Average        Wechsler Memory Test, Fourth Edition        Logical Memory I 10 Average      Logical Memory II 9 Average     Service Details     Billing Information:  The following information details time spent in professional activities related to the evaluation.    Diagnostic Interview/Neurobehavioral Status Exam (Billed on 12/02/2021) Total Time   Clinical interview  45 min.   Documentation  15 min.   Total Time CPT 96116: 1 hour, 0 min.    Test Administration and Scoring by Trained Technicians (Billed on 12/02/2021) Total Time   Face-to-face test administration  120 min.   Scoring  40 min.   Total Time CPT 96138 (first 30 min.)/CPT [phone] (each additional 30 min.): 2 hours, 40 min.    Neuropsychological Evaluation Services by Psychologist (Billed on 12/02/2021) Total Time   Record review  20 min.   Test selection / modification 5 min.   Interpretation of standardized test results and integration with clinical data  30 min.   Consultation with other providers and treatment planning 5 min.   Report writing and documentation  125 min.   Total Time CPT 96132 (first hour) / CPT [phone] (each additional hour): 3 hours, 5 min.

## 2021-12-12 NOTE — Unmapped (Signed)
Pt is stable. Pt endorses pain, relief from current pain regimen. Pt endorses nausea, relief from scheduled zofran. No acute changes this shift, no concerns at this time.    Problem: Adult Inpatient Plan of Care  Goal: Plan of Care Review  Outcome: Ongoing - Unchanged  Flowsheets (Taken 12/12/2021 0400)  Progress: no change  Plan of Care Reviewed With: patient     Problem: Adult Inpatient Plan of Care  Goal: Patient-Specific Goal (Individualized)  Outcome: Ongoing - Unchanged  Flowsheets (Taken 12/12/2021 0400)  Patient-Specific Goals (Include Timeframe): Pt will remain free from falls/injuries this shift  Individualized Care Needs: pain management, nausea management  Anxieties, Fears or Concerns: none expressed

## 2021-12-12 NOTE — Unmapped (Signed)
Patient A/O x4. Family visiting all shift. She was medicated multiple times. Plan is for her to start chemo pill tomorrow morning.    Problem: Adult Inpatient Plan of Care  Goal: Plan of Care Review  Outcome: Ongoing - Unchanged  Goal: Patient-Specific Goal (Individualized)  Outcome: Ongoing - Unchanged  Goal: Absence of Hospital-Acquired Illness or Injury  Intervention: Identify and Manage Fall Risk  Recent Flowsheet Documentation  Taken 12/11/2021 0800 by Marcelyn Ditty, RN  Safety Interventions:   low bed   lighting adjusted for tasks/safety   environmental modification   fall reduction program maintained   family at bedside   nonskid shoes/slippers when out of bed   room near unit station  Intervention: Prevent and Manage VTE (Venous Thromboembolism) Risk  Recent Flowsheet Documentation  Taken 12/11/2021 0800 by Marcelyn Ditty, RN  Activity Management: activity adjusted per tolerance  Intervention: Prevent Infection  Recent Flowsheet Documentation  Taken 12/11/2021 0800 by Marcelyn Ditty, RN  Infection Prevention:   environmental surveillance performed   equipment surfaces disinfected   hand hygiene promoted   personal protective equipment utilized   single patient room provided   rest/sleep promoted

## 2021-12-12 NOTE — Unmapped (Signed)
PHYSICAL THERAPY  Evaluation (12/12/21 1134)          Patient Name:  [name] [name]       Medical Record Number: [id number]   Date of Birth: November 05, 2000  Sex: Female        Treatment Diagnosis: Pain, decreased mobility, generalized deconditioning, muscle wasting     Activity Tolerance: Tolerated treatment well     ASSESSMENT  Problem List: Decreased strength, Pain, Decreased endurance, Gait deviation, Decreased mobility      Assessment : [name] [name] is a 21 y.o. 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 So far workup has been reassuring against obstruction or pouchitis, but she continues to have severe pain limiting eating and requiring IV pain medications.  Patient presents to PT with impairments as noted contributing to decline in activity tolerance and functional mobility.   She is highly motivated to maximize funcitonal mobility and mother present and supportive throughout session. Patient is able to complete bed mobility, transfers, and ambulation with modified independence or better.  However, she remains with generalized weakness, gait impairments and decreased activity tolerance placing her below baseline.   She will benefit from ongoing skilled acute PT to address current deficits and manage mobility with ongoing chemo.  Recommend 3x/weekly (community ambulator) post-acute PT. Based on evaluation findings associated with the patient's personal history factors, examination of body systems, and clinical presentation, the patient classifies as a moderate complexity level evaluation.      Today's Interventions: PT eval. Graded activity as noted for transfers, ambulation. Exteneded ambulation for maximized activity tolerance.   Patient provided HEP with theraband for while inpatient.  Patient and mother educated regarding PT and POC, ambulation with hospital staff while OOB, safety with mobility, activity pacing, AD use.     Personal Factors/Comorbidities Present: 3+   Examination of Body System: Musculoskeletal, Cardiovascular, Activity/participation  Clinical Presentation: Evolving    Clinical Decision Making: Moderate      PLAN  Planned Frequency of Treatment:  1-2x per day for: 2-3x week Planned Treatment Duration: 12/26/21     Planned Interventions: Functional mobility, Gait training, Home exercise program, Transfer training, Stair training, Therapeutic exercise, Therapeutic activity, Endurance activities, Education - Family / caregiver, Education - Patient     Post-Discharge Physical Therapy Recommendations:  PT Post Acute Discharge Recommendations: Skilled PT services indicated, 3x weekly, Tourist information centre manager      PT DME Recommendations: Rolling walker            Goals:   Patient and Family Goals: Increase muscle strength/mass     Long Term Goal #1: Patient will be independent for community ambulation distances >82mile in 6 weeks        SHORT GOAL #1: Patient will complete all bed mobility with independence from a flat bed               Time Frame : 2 weeks  SHORT GOAL #2: Patient will ambulate >540ft, indep              Time Frame : 2 weeks  SHORT GOAL #3: Patient will complete x2 stairs with single HR, mod-I              Time Frame : 2 weeks  Prognosis:  Guarded  Positive Indicators: Age, family support, PLOF  Barriers to Discharge: None     SUBJECTIVE  Patient reports: Patient agreeable to PT. I was doing good  Current Functional Status: Patient received/left semi-reclined in bed with all needs within reach, mother at bedside     Prior Functional Status: Patient reports independence without AD PTA.   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  Currently living with parents.  History of syncope, denies other falls.  Equipment available at home:  (medical recliner)      Past Medical History:   Diagnosis Date Abdominal pain     Acid reflux     occas    Anesthesia complication     itching, shaking, coldness; last few surgeries have gone much better    Cataract of right eye     COVID-19 virus infection 01/2019    Cyst of thyroid determined by ultrasound     monitoring    [name]     2 right forearm, 1 left thigh, 1 right scapula, 1 under left clavicle; multiple    Difficult intravenous access     [name] (familial adenomatous polyposis)     [name]     Gastric polyps     History of chemotherapy     last treatment approx 05/2019    History of colon polyps     History of COVID-19 01/2019    Iron deficiency anemia due to chronic blood loss     received iron infusion 11-2019    PONV (postoperative nausea and vomiting)     Rectal bleeding     Syncopal episodes     especially if becoming dehydrated            Social History     Tobacco Use    Smoking status: Never     Passive exposure: Past    Smokeless tobacco: Never   Substance Use Topics    Alcohol use: Never       Past Surgical History:   Procedure Laterality Date    cyroablation      cystis removal      desmoid removal      PR CLOSE ENTEROSTOMY,RESEC+ANAST N/A 10/09/2020    Procedure: ILEOSTOMY TAKEDOWN;  Surgeon: Mickle Asper, MD;  Location: OR [place];  Service: General Surgery    PR COLONOSCOPY W/BIOPSY SINGLE/MULTIPLE N/A 10/27/2012    Procedure: COLONOSCOPY, FLEXIBLE, PROXIMAL TO SPLENIC FLEXURE; WITH BIOPSY, SINGLE OR MULTIPLE;  Surgeon: Shirlyn Goltz Mir, MD;  Location: PEDS PROCEDURE ROOM [place];  Service: Gastroenterology    PR COLONOSCOPY W/BIOPSY SINGLE/MULTIPLE N/A 09/14/2013    Procedure: COLONOSCOPY, FLEXIBLE, PROXIMAL TO SPLENIC FLEXURE; WITH BIOPSY, SINGLE OR MULTIPLE;  Surgeon: Shirlyn Goltz Mir, MD;  Location: PEDS PROCEDURE ROOM [place];  Service: Gastroenterology    PR COLONOSCOPY W/BIOPSY SINGLE/MULTIPLE N/A 11/08/2014    Procedure: COLONOSCOPY, FLEXIBLE, PROXIMAL TO SPLENIC FLEXURE; WITH BIOPSY, SINGLE OR MULTIPLE;  Surgeon: Arnold Long Mir, MD; Location: PEDS PROCEDURE ROOM [facility];  Service: Gastroenterology    PR COLONOSCOPY W/BIOPSY SINGLE/MULTIPLE N/A 12/26/2015    Procedure: COLONOSCOPY, FLEXIBLE, PROXIMAL TO SPLENIC FLEXURE; WITH BIOPSY, SINGLE OR MULTIPLE;  Surgeon: Arnold Long Mir, MD;  Location: PEDS PROCEDURE ROOM [facility];  Service: Gastroenterology    PR COLONOSCOPY W/BIOPSY SINGLE/MULTIPLE N/A 09/02/2017    Procedure: COLONOSCOPY, FLEXIBLE, PROXIMAL TO SPLENIC FLEXURE; WITH BIOPSY, SINGLE OR MULTIPLE;  Surgeon: Arnold Long Mir, MD;  Location: PEDS PROCEDURE ROOM [facility];  Service: Gastroenterology    PR COLSC FLX  W/REMOVAL LESION BY HOT BX FORCEPS N/A 08/27/2016    Procedure: COLONOSCOPY, FLEXIBLE, PROXIMAL TO SPLENIC FLEXURE; W/REMOVAL TUMOR/POLYP/OTHER LESION, HOT BX FORCEP/CAUTE;  Surgeon: Arnold Long Mir, MD;  Location: PEDS PROCEDURE ROOM [facility];  Service: Gastroenterology    PR COLSC FLX W/RMVL OF TUMOR POLYP LESION SNARE TQ N/A 02/25/2019    Procedure: COLONOSCOPY FLEX; W/REMOV TUMOR/LES BY SNARE;  Surgeon: Helyn Numbers, MD;  Location: GI PROCEDURES [place] [facility];  Service: Gastroenterology    PR COLSC FLX W/RMVL OF TUMOR POLYP LESION SNARE TQ N/A 03/13/2020    Procedure: COLONOSCOPY FLEX; W/REMOV TUMOR/LES BY SNARE;  Surgeon: Helyn Numbers, MD;  Location: [facility] [place] [facility];  Service: Gastroenterology    PR EXC SKIN BENIG 2.1-3 CM TRUNK,ARM,LEG Right 02/25/2017    Procedure: EXCISION, BENIGN LESION INCLUDE MARGINS, EXCEPT SKIN TAG, LEGS; EXCISED DIAMETER 2.1 TO 3.0 CM;  Surgeon: Clarene Duke, MD;  Location: CHILDRENS OR [facility];  Service: Plastics    PR EXC SKIN BENIG 3.1-4 CM TRUNK,ARM,LEG Right 02/25/2017    Procedure: EXCISION, BENIGN LESION INCLUDE MARGINS, EXCEPT SKIN TAG, ARMS; EXCISED DIAMETER 3.1 TO 4.0 CM;  Surgeon: Clarene Duke, MD;  Location: CHILDRENS OR [facility];  Service: Plastics    PR EXC SKIN BENIG >4 CM FACE,FACIAL Right 02/25/2017    Procedure: EXCISION, OTHER BENIGN LES INCLUD MARGIN, FACE/EARS/EYELIDS/NOSE/LIPS/MUCOUS MEMBRANE; EXCISED DIAM >4.0 CM;  Surgeon: Clarene Duke, MD;  Location: CHILDRENS OR [facility];  Service: Plastics    PR EXC TUMOR SOFT TISSUE LEG/ANKLE SUBQ 3+CM Right 08/05/2019    Procedure: EXCISION, TUMOR, SOFT TISSUE OF LEG OR ANKLE AREA, SUBCUTANEOUS; 3 CM OR GREATER;  Surgeon: Arsenio Katz, MD;  Location: MAIN OR [place];  Service: Plastics    PR EXC TUMOR SOFT TISSUE LEG/ANKLE SUBQ <3CM Right 08/05/2019    Procedure: EXCISION, TUMOR, SOFT TISSUE OF LEG OR ANKLE AREA, SUBCUTANEOUS; LESS THAN 3 CM;  Surgeon: Arsenio Katz, MD;  Location: MAIN OR [facility];  Service: Plastics    PR LAP, SURG PROCTECTOMY W J-POUCH N/A 08/10/2020    Procedure: ROBOTIC ASSISTED LAPAROSCOPIC PROCTOCOLECTOMY, ILEAL J POUCH, WITH OSTOMY;  Surgeon: Mickle Asper, MD;  Location: OR [place];  Service: General Surgery    PR NDSC EVAL INTSTINAL POUCH DX W/COLLJ SPEC SPX N/A 01/23/2021    Procedure: ENDO EVAL SM INTEST POUCH; DX;  Surgeon: Modena Nunnery, MD;  Location: [facility] [place] [facility];  Service: Gastroenterology    PR NDSC EVAL INTSTINAL POUCH DX W/COLLJ SPEC SPX N/A 08/27/2021    Procedure: ENDO EVAL SM INTEST POUCH; DX;  Surgeon: Hunt Oris, MD;  Location: [facility] PROC[facility]URES [facility];  Service: Gastroenterology    PR NDSC EVAL INTSTINAL POUCH DX W/COLLJ SPEC SPX N/A 12/09/2021    Procedure: ENDO EVAL SM INTEST POUCH; DX;  Surgeon: Vidal Schwalbe, MD;  Location: GI PROCEDURES [facility];  Service: Gastroenterology    PR UNLISTED PROCEDURE SMALL INTESTINE  01/23/2021    Procedure: UNLISTED PROCEDURE, SMALL INTESTINE;  Surgeon: Modena Nunnery, MD;  Location: [facility] [place] [facility];  Service: Gastroenterology    PR UPPER GI ENDOSCOPY,BIOPSY N/A 10/27/2012    Procedure: UGI ENDOSCOPY; WITH BIOPSY, SINGLE OR MULTIPLE;  Surgeon: Shirlyn Goltz Mir, MD;  Location: PEDS PROCEDURE ROOM [facility];  Service: Gastroenterology    PR UPPER GI ENDOSCOPY,BIOPSY N/A 09/14/2013    Procedure: UGI ENDOSCOPY; WITH BIOPSY, SINGLE OR MULTIPLE;  Surgeon: Shirlyn Goltz Mir, MD;  Location: PEDS PROCEDURE ROOM [facility];  Service: Gastroenterology  PR UPPER GI ENDOSCOPY,BIOPSY N/A 11/08/2014    Procedure: UGI ENDOSCOPY; WITH BIOPSY, SINGLE OR MULTIPLE;  Surgeon: Arnold Long Mir, MD;  Location: PEDS PROCEDURE ROOM [facility];  Service: Gastroenterology    PR UPPER GI ENDOSCOPY,BIOPSY N/A 12/26/2015    Procedure: UGI ENDOSCOPY; WITH BIOPSY, SINGLE OR MULTIPLE;  Surgeon: Arnold Long Mir, MD;  Location: PEDS PROCEDURE ROOM [facility];  Service: Gastroenterology    PR UPPER GI ENDOSCOPY,BIOPSY N/A 08/27/2016    Procedure: UGI ENDOSCOPY; WITH BIOPSY, SINGLE OR MULTIPLE;  Surgeon: Arnold Long Mir, MD;  Location: PEDS PROCEDURE ROOM [facility];  Service: Gastroenterology    PR UPPER GI ENDOSCOPY,BIOPSY N/A 09/02/2017    Procedure: UGI ENDOSCOPY; WITH BIOPSY, SINGLE OR MULTIPLE;  Surgeon: Arnold Long Mir, MD;  Location: PEDS PROCEDURE ROOM [facility];  Service: Gastroenterology    PR UPPER GI ENDOSCOPY,BIOPSY N/A 03/13/2020    Procedure: UGI ENDOSCOPY; WITH BIOPSY, SINGLE OR MULTIPLE;  Surgeon: Helyn Numbers, MD;  Location: [facility] [place] [facility];  Service: Gastroenterology    PR UPPER GI ENDOSCOPY,BIOPSY N/A 09/05/2021    Procedure: UGI ENDOSCOPY; WITH BIOPSY, SINGLE OR MULTIPLE;  Surgeon: Wendall Papa, MD;  Location: GI PROC[facility]URES [facility];  Service: Gastroenterology    TUMOR REMOVAL      multiple-head, neck, back, hand, right flank, multiple             Family History   Problem Relation Age of Onset    No Known Problems Mother     No Known Problems Father     No Known Problems Sister     No Known Problems Brother     Stroke Maternal Grandmother     Other Maternal Grandmother         benign lesions of liver and pancreas, further details unknown    Cancer Maternal Grandmother     Diabetes Maternal Grandmother     Hypertension Maternal Grandmother     Thyroid disease Maternal Grandmother     Arthritis Maternal Grandfather     Asthma Maternal Grandfather     COPD Paternal Grandmother         Deceased    Miscarriages / Stillbirths Paternal Grandmother     Alcohol abuse Paternal Grandfather         Deceased    No Known Problems Maternal Aunt     No Known Problems Maternal Uncle     No Known Problems Paternal Aunt     No Known Problems Paternal Uncle     Anesthesia problems Neg Hx     Broken bones Neg Hx     Cancer Neg Hx     Clotting disorder Neg Hx     Collagen disease Neg Hx     Diabetes Neg Hx     Dislocations Neg Hx     Fibromyalgia Neg Hx     Gout Neg Hx     Hemophilia Neg Hx     Osteoporosis Neg Hx     Rheumatologic disease Neg Hx     Scoliosis Neg Hx     Severe sprains Neg Hx     Sickle cell anemia Neg Hx     Spinal Compression Fracture Neg Hx     Melanoma Neg Hx     Basal cell carcinoma Neg Hx     Squamous cell carcinoma Neg Hx         Allergies: Adhesive tape-silicones; Ferrlecit [sodium ferric gluconat-sucrose]; Methylnaltrexone; Neomycin; Papaya; Morphine; Zosyn [piperacillin-tazobactam]; Compazine [prochlorperazine]; Latex, natural rubber; and Opioids -  morphine analogues       Objective Findings  Precautions / Restrictions  Precautions: Non-applicable  Weight Bearing Status: Non-applicable  Required Braces or Orthoses: Non-applicable     Communication Preference: Verbal          Pain Comments: Patient with generalized pain and in abdomen. Does not rate, RN in room at end of session  Medical Tests / Procedures: Reviewed in Epic prior  Equipment / Environment: Vascular access (PIV, TLC, Port-a-cath, PICC)     Vitals/Orthostatics : VSS per Epic; NAD     Living Situation  Living Environment: House  Lives With: Mother, Father  Home Living: Multi-level home, Bed/bath upstairs, 1/2 bath on main level, Stairs to alternate level with rails, Stairs to enter without rails, Stairs to alternate level without rails, Handicapped height toilet, Tub/shower unit, Able to Live on main level with bedroom/bathroom (Pt stays on main level in living room. Sleeps in medical recliner, 2 STE)  Number of Stairs to Enter (outside): 2  Rail placement (inside): Rail on right side  Number of Stairs to Alternate level (inside): 14      Cognition: WFL  Visual/Perception: Within Functional Limits     Skin Inspection: Intact where visualized     Upper Extremities  UE ROM: Right WFL, Left WFL  UE Strength: Left WFL, Right WFL    Lower Extremities  LE ROM: Right WFL, Left WFL  LE Strength: Left WFL, Right WFL  LE comment: Grossly 4+/5  BLE.   Pt reports decreased endurance B LE     Coordination: WFL  Sensation: WFL  Posture: WFL    Static Sitting-Level of Assistance: Independent  Dynamic Sitting-Level of Assistance: Archivist Standing-Level of Assistance: Independent  Dynamic Standing - Level of Assistance: Independent      Bed Mobility: Supine<>sit mod-I with HOB elevated. PT sleeps in medical recliner at baseline     Transfer comments: STS with independence.      Gait Level of Assistance: Independent  Gait Assistive Device: None  Gait Distance Ambulated (ft): 300 ft  Gait: Patient ambulates ~310ft with independence.   Pt with some gait impairments: decreased step length and slight knee flexion throughout d/t weakness.     Stairs: NT       Endurance: Good (below baseline)    AM-PAC-6 click    Difficulty turning over In bed?: None - Modified Independent/Independent  Difficulty sitting down/standing up from chair with arms? : None - Modified Independent/Independent  Difficulty moving from supine to sitting on edge of bed?: A Little - Minimal/Contact Guard Assist/Supervision  Help moving to and from bed from wheelchair?: None - Modified Independent/Independent  Help currently needed walking in a hospital room?: None - Modified Independent/Independent  Help currently needed climbing 3-5 steps with railing?: None - Modified Independent/Independent    Basic Mobility Score:  Basic Mobility Score 6 click: 23    6 click  Score (in points): % of Functional Impairment, Limitation, Restriction  6: 100% impaired, limited, restricted  7-8: At least 80%, but less than 100% impaired, limited restricted  9-13: At least 60%, but less than 80% impaired, limited restricted  14-19: At least 40%, but less than 60% impaired, limited restricted  20-22: At least 20%, but less than 40% impaired, limited restricted  23: At least 1%, but less than 20% impaired, limited restricted  24: 0% impaired, limited restricted      Physical Therapy Session Duration  PT Individual [mins]: 28  Medical Staff Made Aware: RN     I attest that I have reviewed the above information.  Signed: Faythe Dingwall, PT  Filed 12/12/2021

## 2021-12-12 NOTE — Unmapped (Signed)
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 On empiric levaquin given dilated loops of bowel on CTAP to prevent bacterial translocation. S/p Pouchoscopy 12/11/2021 with normal mucosa of both efferent and afferent limb, no evidence of stricture. Two small ulcers were found at anastomosis and in efferent limb without evidence of bleeding.     She is continue to struggle with crampy abdominal pain that is likely multifactorial from desmoid tumors as well as secondary to a disorders of the gut brain axis and visceral hypersensitivity. Given her recent weight loss and inability to keep up with nutritional requirements, nutrition has recommended starting tube feeds. We discussed her case with her primary GI physician Dr. [name] and he believes it is reasonable to start NG tube feeds to try to avoid TPN.     Recommendations:  Miralax as tolerated  Agree with palliative care in switching to a scheduled PO pain medications and trying to avoid IV pain medications  Continue small frequent meals  Chemotherapy of desmoid tumors per oncology  Hyoscamine PRN   Levofloxacin x 10 days for treatment of SIBO  Can place Corpak feeding tube and start feeds and see how she tolerates    I have communicated recommendations with the patient's primary team    Thank you for involving [name] in the care of your patient. We will continue to follow along with you.     For questions, contact the on-call fellow for the Gastroenterology (Luminal) Consult Service at [phone].     Caryn Section, GI fellow    Interval History:   Started sorafenib today. Tried reglan 2.5 but had a bad reaction to it and had severe abdominal cramping and urgency. Dilaudid IV 6, dilaudid PO 6. Has been taking Zyprexa which she thinks is helping.     Said that the nausea is under control and now it is mostly just abdominal pain that is cramping as well as gas and bloating of her pouch. Has tried amitriptyline in the past and caused her to have diarrhea which resolved after stopping it. Has also tried duloxetine int he past which made her nauseous.     Objective:   Temp:  [36.8 ??C (98.2 ??F)-37.1 ??C (98.8 ??F)] 36.8 ??C (98.2 ??F)  Heart Rate:  [82-111] 82  Resp:  [17] 17  BP: (101-126)/(62-91) 101/62  SpO2:  [97 %] 97 %    Gen: WDWN in NAD, answers questions appropriately  Abdomen: Soft, NTND.   Extremities: No edema in the BLEs    Pertinent Labs/Studies Reviewed:   As above

## 2021-12-12 NOTE — Unmapped (Signed)
OCCUPATIONAL THERAPY  Evaluation (12/12/21 0945)    Patient Name:  [name] [name]       Medical Record Number: [id number]   Date of Birth: June 02, 2000  Sex: Female            OT Treatment Diagnosis:  Pt with acute on chronic abdominal pain and deconditioning impacting independence in ADLs    Assessment  Problem List: Decreased strength, Decreased range of motion, Pain  Personal Factors/Comorbidities (Occupational Profile and History Review): Expanded (Moderate)  Assessment of Occupational Performance : Endurance, Mobility, Strength  Clinical Decision Making: Moderate Complexity    Assessment: [name] is seriously and acutely ill due to [name] s/p colectomy, desmoid tumors, complicated by co-morbid acute and chronic conditions including aneima, anxiety, chronic right shoulder pain, severe protein-calorie malnutrition, admitted with acute on chronic abdominal pain, palliative care consulted to assist with symptom management and support.   Pt participates in acute OT evaluation this date. She is mod I for bed mobiltiy and STS, and grossly supervision-mod I for functional ambulation 2/2 weakness. She remains limited by strength, muscular endurance, pain, and R shoulder impairments. At this time, OT recommending 3x with shower chair upon dc      Today's Interventions: OT eval, role of OT, POC. Pt participates in short distance ambulation within the room. OT provides instruction for BUE/BLE therex that can independently be completed outside of therapy, pt receptive. Additional education provided on using shower chair + LH sponge within the home to reduce risks of falls and increase pt's independence. OT provides coloring supplies for leisure pursuit and to promote well-being.    Activity Tolerance During Today's Session  Tolerated treatment well    Plan  Planned Frequency of Treatment:  1-2x per day for: 2-3x week  Planned Treatment Duration: 01/02/22    Planned Interventions:  ADL retraining, Balance activities, Education - Patient, Education - Family / caregiver, Endurance activities, Home exercise program, Functional mobility, Movement facilitation, Neuromuscular re-education, Therapeutic exercise, Therapist provided opportunity for spontaneous movement, UE Strength / coordination exercise, Transfer training    Post-Discharge Occupational Therapy Recommendations:   Skilled OT services NOT indicated, 3x weekly   OT DME Recommendations: Shower chair with back -        GOALS:   Patient and Family Goals: To return home and feel better    IP Long Term Goal #1: Pt will score 24/24 on AMPAC by 8 weeks       Short Term:  SHORT GOAL #1: Pt will independently teachback RUE HEP   Time Frame : 2 weeks  SHORT GOAL #2: Pt will complete shower with supervision utilizing compensatory strategies for independence   Time Frame : 2 weeks                          Prognosis:  Good  Positive Indicators:  age, family support  Barriers to Discharge: None    Subjective  Current Status Pt greeted/left semi-reclined in bed with mother at bedside and all needs met  Prior Functional Status Pt reports she was grossly independent with bADLs. Pt reports difficulty with showering d/t R shoulder pain and fatigue, limited to taking showers every few days. Parents assist with all IADLs, does not drive d/t medication. 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 She enjoys hanging out with her friends and family, playing board games, having dance parties and coloring    Medical Tests / Procedures: EPIC Chart Review  Patient / Caregiver reports: I like to color    Past Medical History:   Diagnosis Date    Abdominal pain     Acid reflux     occas    Anesthesia complication     itching, shaking, coldness; last few surgeries have gone much better    Cataract of right eye     COVID-19 virus infection 01/2019    Cyst of thyroid determined by ultrasound     monitoring    [name]     2 right forearm, 1 left thigh, 1 right scapula, 1 under left clavicle; multiple    Difficult intravenous access     [name] (familial adenomatous polyposis)     [name]     Gastric polyps     History of chemotherapy     last treatment approx 05/2019    History of colon polyps     History of COVID-19 01/2019    Iron deficiency anemia due to chronic blood loss     received iron infusion 11-2019    PONV (postoperative nausea and vomiting)     Rectal bleeding     Syncopal episodes     especially if becoming dehydrated    Social History     Tobacco Use    Smoking status: Never     Passive exposure: Past    Smokeless tobacco: Never   Substance Use Topics    Alcohol use: Never      Past Surgical History:   Procedure Laterality Date    cyroablation      cystis removal      desmoid removal      PR CLOSE ENTEROSTOMY,RESEC+ANAST N/A 10/09/2020    Procedure: ILEOSTOMY TAKEDOWN;  Surgeon: Mickle Asper, MD;  Location: OR [place];  Service: General Surgery    PR COLONOSCOPY W/BIOPSY SINGLE/MULTIPLE N/A 10/27/2012    Procedure: COLONOSCOPY, FLEXIBLE, PROXIMAL TO SPLENIC FLEXURE; WITH BIOPSY, SINGLE OR MULTIPLE;  Surgeon: Shirlyn Goltz Mir, MD;  Location: PEDS PROCEDURE ROOM [place];  Service: Gastroenterology    PR COLONOSCOPY W/BIOPSY SINGLE/MULTIPLE N/A 09/14/2013    Procedure: COLONOSCOPY, FLEXIBLE, PROXIMAL TO SPLENIC FLEXURE; WITH BIOPSY, SINGLE OR MULTIPLE;  Surgeon: Shirlyn Goltz Mir, MD;  Location: PEDS PROCEDURE ROOM [place];  Service: Gastroenterology    PR COLONOSCOPY W/BIOPSY SINGLE/MULTIPLE N/A 11/08/2014    Procedure: COLONOSCOPY, FLEXIBLE, PROXIMAL TO SPLENIC FLEXURE; WITH BIOPSY, SINGLE OR MULTIPLE;  Surgeon: Arnold Long Mir, MD;  Location: PEDS PROCEDURE ROOM [facility];  Service: Gastroenterology    PR COLONOSCOPY W/BIOPSY SINGLE/MULTIPLE N/A 12/26/2015    Procedure: COLONOSCOPY, FLEXIBLE, PROXIMAL TO SPLENIC FLEXURE; WITH BIOPSY, SINGLE OR MULTIPLE;  Surgeon: Arnold Long Mir, MD;  Location: PEDS PROCEDURE ROOM [facility];  Service: Gastroenterology    PR COLONOSCOPY W/BIOPSY SINGLE/MULTIPLE N/A 09/02/2017    Procedure: COLONOSCOPY, FLEXIBLE, PROXIMAL TO SPLENIC FLEXURE; WITH BIOPSY, SINGLE OR MULTIPLE;  Surgeon: Arnold Long Mir, MD;  Location: PEDS PROCEDURE ROOM [place];  Service: Gastroenterology    PR COLSC FLX W/REMOVAL LESION BY HOT BX FORCEPS N/A 08/27/2016    Procedure: COLONOSCOPY, FLEXIBLE, PROXIMAL TO SPLENIC FLEXURE; W/REMOVAL TUMOR/POLYP/OTHER LESION, HOT BX FORCEP/CAUTE;  Surgeon: Arnold Long Mir, MD;  Location: PEDS PROCEDURE ROOM [facility];  Service: Gastroenterology    PR COLSC FLX W/RMVL OF TUMOR POLYP LESION SNARE TQ N/A 02/25/2019    Procedure: COLONOSCOPY FLEX; W/REMOV TUMOR/LES BY SNARE;  Surgeon: Helyn Numbers, MD;  Location: GI PROCEDURES [place] [facility];  Service: Gastroenterology    PR COLSC FLX W/RMVL OF TUMOR POLYP  LESION SNARE TQ N/A 03/13/2020    Procedure: COLONOSCOPY FLEX; W/REMOV TUMOR/LES BY SNARE;  Surgeon: Helyn Numbers, MD;  Location: [facility] PROCEDURES [place] [facility];  Service: Gastroenterology    PR EXC SKIN BENIG 2.1-3 CM TRUNK,ARM,LEG Right 02/25/2017    Procedure: EXCISION, BENIGN LESION INCLUDE MARGINS, EXCEPT SKIN TAG, LEGS; EXCISED DIAMETER 2.1 TO 3.0 CM;  Surgeon: Clarene Duke, MD;  Location: CHILDRENS OR [facility];  Service: Plastics    PR EXC SKIN BENIG 3.1-4 CM TRUNK,ARM,LEG Right 02/25/2017    Procedure: EXCISION, BENIGN LESION INCLUDE MARGINS, EXCEPT SKIN TAG, ARMS; EXCISED DIAMETER 3.1 TO 4.0 CM;  Surgeon: Clarene Duke, MD;  Location: CHILDRENS OR [facility];  Service: Plastics    PR EXC SKIN BENIG >4 CM FACE,FACIAL Right 02/25/2017    Procedure: EXCISION, OTHER BENIGN LES INCLUD MARGIN, FACE/EARS/EYELIDS/NOSE/LIPS/MUCOUS MEMBRANE; EXCISED DIAM >4.0 CM;  Surgeon: Clarene Duke, MD;  Location: CHILDRENS OR [facility];  Service: Plastics    PR EXC TUMOR SOFT TISSUE LEG/ANKLE SUBQ 3+CM Right 08/05/2019    Procedure: EXCISION, TUMOR, SOFT TISSUE OF LEG OR ANKLE AREA, SUBCUTANEOUS; 3 CM OR GREATER;  Surgeon: Arsenio Katz, MD;  Location: MAIN OR [place];  Service: Plastics    PR EXC TUMOR SOFT TISSUE LEG/ANKLE SUBQ <3CM Right 08/05/2019    Procedure: EXCISION, TUMOR, SOFT TISSUE OF LEG OR ANKLE AREA, SUBCUTANEOUS; LESS THAN 3 CM;  Surgeon: Arsenio Katz, MD;  Location: MAIN OR [facility];  Service: Plastics    PR LAP, SURG PROCTECTOMY W J-POUCH N/A 08/10/2020    Procedure: ROBOTIC ASSISTED LAPAROSCOPIC PROCTOCOLECTOMY, ILEAL J POUCH, WITH OSTOMY;  Surgeon: Mickle Asper, MD;  Location: OR [place];  Service: General Surgery    PR NDSC EVAL INTSTINAL POUCH DX W/COLLJ SPEC SPX N/A 01/23/2021    Procedure: ENDO EVAL SM INTEST POUCH; DX;  Surgeon: Modena Nunnery, MD;  Location: [facility] PROC[facility] [place] [facility];  Service: Gastroenterology    PR NDSC EVAL INTSTINAL POUCH DX W/COLLJ SPEC SPX N/A 08/27/2021    Procedure: ENDO EVAL SM INTEST POUCH; DX;  Surgeon: Hunt Oris, MD;  [facility];  Service: Gastroenterology    PR NDSC EVAL INTSTINAL POUCH DX W/COLLJ SPEC SPX N/A 12/09/2021    Procedure: ENDO EVAL SM INTEST POUCH; DX;  Surgeon: Vidal Schwalbe, MD;  Location: [facility] PROC[facility]URES [facility];  Service: Gastroenterology    PR UNLISTED PROCEDURE SMALL INTESTINE  01/23/2021    Procedure: UNLISTED PROCEDURE, SMALL INTESTINE;  Surgeon: Modena Nunnery, MD;  Location: [facility] PROCEDURES [place] [facility];  Service: Gastroenterology    PR UPPER GI ENDOSCOPY,BIOPSY N/A 10/27/2012    Procedure: UGI ENDOSCOPY; WITH BIOPSY, SINGLE OR MULTIPLE;  Surgeon: Shirlyn Goltz Mir, MD;  Location: PEDS PROCEDURE ROOM [facility];  Service: Gastroenterology    PR UPPER GI ENDOSCOPY,BIOPSY N/A 09/14/2013    Procedure: UGI ENDOSCOPY; WITH BIOPSY, SINGLE OR MULTIPLE;  Surgeon: Shirlyn Goltz Mir, MD;  Location: PEDS PROCEDURE ROOM [facility];  Service: Gastroenterology    PR UPPER GI ENDOSCOPY,BIOPSY N/A 11/08/2014    Procedure: UGI ENDOSCOPY; WITH BIOPSY, SINGLE OR MULTIPLE;  Surgeon: Arnold Long Mir, MD;  Location: PEDS PROCEDURE ROOM [facility];  Service: Gastroenterology PR UPPER GI ENDOSCOPY,BIOPSY N/A 12/26/2015    Procedure: UGI ENDOSCOPY; WITH BIOPSY, SINGLE OR MULTIPLE;  Surgeon: Arnold Long Mir, MD;  Location: PEDS PROCEDURE ROOM [facility];  Service: Gastroenterology    PR UPPER GI ENDOSCOPY,BIOPSY N/A 08/27/2016    Procedure: UGI ENDOSCOPY; WITH BIOPSY, SINGLE OR MULTIPLE;  Surgeon: Rozell Searing  Ahmed Mir, MD;  Location: PEDS PROCEDURE ROOM [facility];  Service: Gastroenterology    PR UPPER GI ENDOSCOPY,BIOPSY N/A 09/02/2017    Procedure: UGI ENDOSCOPY; WITH BIOPSY, SINGLE OR MULTIPLE;  Surgeon: Arnold Long Mir, MD;  Location: PEDS PROCEDURE ROOM [facility];  Service: Gastroenterology    PR UPPER GI ENDOSCOPY,BIOPSY N/A 03/13/2020    Procedure: UGI ENDOSCOPY; WITH BIOPSY, SINGLE OR MULTIPLE;  Surgeon: Helyn Numbers, MD;  Location: [facility] PROC[facility] [place] [facility];  Service: Gastroenterology    PR UPPER GI ENDOSCOPY,BIOPSY N/A 09/05/2021    Procedure: UGI ENDOSCOPY; WITH BIOPSY, SINGLE OR MULTIPLE;  Surgeon: Wendall Papa, MD;  [facility]: [facility];  Service: Gastroenterology    TUMOR REMOVAL      multiple-head, neck, back, hand, right flank, multiple    Family History   Problem Relation Age of Onset    No Known Problems Mother     No Known Problems Father     No Known Problems Sister     No Known Problems Brother     Stroke Maternal Grandmother     Other Maternal Grandmother         benign lesions of liver and pancreas, further details unknown    Cancer Maternal Grandmother     Diabetes Maternal Grandmother     Hypertension Maternal Grandmother     Thyroid disease Maternal Grandmother     Arthritis Maternal Grandfather     Asthma Maternal Grandfather     COPD Paternal Grandmother         Deceased    Miscarriages / Stillbirths Paternal Grandmother     Alcohol abuse Paternal Grandfather         Deceased    No Known Problems Maternal Aunt     No Known Problems Maternal Uncle     No Known Problems Paternal Aunt     No Known Problems Paternal Uncle     Anesthesia problems Neg Hx Broken bones Neg Hx     Cancer Neg Hx     Clotting disorder Neg Hx     Collagen disease Neg Hx     Diabetes Neg Hx     Dislocations Neg Hx     Fibromyalgia Neg Hx     Gout Neg Hx     Hemophilia Neg Hx     Osteoporosis Neg Hx     Rheumatologic disease Neg Hx     Scoliosis Neg Hx     Severe sprains Neg Hx     Sickle cell anemia Neg Hx     Spinal Compression Fracture Neg Hx     Melanoma Neg Hx     Basal cell carcinoma Neg Hx     Squamous cell carcinoma Neg Hx         Adhesive tape-silicones; Ferrlecit [sodium ferric gluconat-sucrose]; Methylnaltrexone; Neomycin; Papaya; Morphine; Zosyn [piperacillin-tazobactam]; Compazine [prochlorperazine]; Latex, natural rubber; and Opioids - morphine analogues     Objective Findings  Precautions / Restrictions  Non-applicable       Weight Bearing  Non-applicable    Required Braces or Orthoses  Non-applicable    Communication Preference  Verbal    Pain  Baseline pain, not rated and does not worsen with activity. Agreeable to continue with OT    Equipment / Environment  Vascular access (PIV, TLC, Port-a-cath, PICC)    Living Situation  Living Environment: House  Lives With: Mother, Father  Home Living: Multi-level home, Bed/bath upstairs, 1/2 bath on main level, Stairs to alternate level with  rails, Stairs to enter without rails, Stairs to alternate level without rails, Handicapped height toilet, Tub/shower unit, Able to Live on main level with bedroom/bathroom (Pt stays on main level in living room. Sleeps in medical recliner, 2 STE)  Number of Stairs to Enter (outside): 2  Rail placement (inside): Rail on right side  Number of Stairs to Alternate level (inside): 14  Equipment available at home:  (medical recliner)     Cognition   Orientation Level:  Oriented x 4   Arousal/Alertness:  Appropriate responses to stimuli   Attention Span:  Appears intact   Memory:  Appears intact   Following Commands:  Follows all commands and directions without difficulty   Safety Judgment:  Good awareness of safety precautions   Awareness of Errors:  Good awareness of errors made   Problem Solving:  Able to problem solve independently   Comments:      Vision / Hearing   Vision: No deficits identified     Hearing: No deficit identified         Hand Function:  Right Hand Function: Right hand function impaired  Right Hand Impairment: grip strength fair  Left Hand Function: Left hand grip strength, ROM and coordination WNL  Hand Dominance: Unknown    Skin Inspection:  Skin Inspection: Intact where visualized    ROM / Strength:  UE ROM/Strength: Left WFL, Right Impaired/Limited  RUE Impairment: Reduced strength, Limited AROM, Pain with movement  UE ROM/ Strength Comment: Scapular winging, able to bring RUE to 90d  Presli Fanguy ROM/Strength: Left Impaired/Limited, Right Impaired/Limited  RLE Impairment: Reduced strength  LLE Impairment: Reduced strength    Coordination:  Coordination comment: appears WFL    Sensation:  RUE Sensation: RUE intact  LUE Sensation: LUE intact  RLE Sensation: RLE intact  LLE Sensation: LLE intact  Sensory/ Proprioception/ Stereognosis comments: denies N/T    Balance:  Sitting Balance comments: EOB balance mod I         Functional Mobility  Transfer Assistance Needed: No  Bed Mobility Assistance Needed: No  Ambulation: Pt walks around room with no AD+ supervision    ADLs  ADLs: Modified Independent, Needs assistance with ADLs  ADLs - Needs Assistance: Bathing  Bathing - Needs Assistance: Min assist    Vitals / Orthostatics  Vitals/Orthostatics: NAD    Medical Staff Made Aware: RN/PT    Occupational Therapy Session Duration  OT Individual [mins]: 33       AM-PAC-Daily Activity  Lower Body Dressing assistance needs: None - Modified Independent/Independent  Bathing assistance needs: A Little - Minimal/Contact Guard Assist/Supervision  Toileting assistance needs: A Little - Minimal/Contact Guard Assist/Supervision  Upper Body Dressing assistance needs: None - Modified Independent/Independent  Personal Grooming assistance needs: None - Modified Independent/Independent  Eating Meals assistance needs: None - Modified Independent/Independent    Daily Activity Score:  Daily Activity Score: 22    Score (in points): % of Functional Impairment, Limitation, Restriction  6: 100% impaired, limited, restricted  7-8: At least 80%, but less than 100% impaired, limited restricted  9-13: At least 60%, but less than 80% impaired, limited restricted  14-19: At least 40%, but less than 60% impaired, limited restricted  20-22: At least 20%, but less than 40% impaired, limited restricted  23: At least 1%, but less than 20% impaired, limited restricted  24: 0% impaired, limited restricted      I attest that I have reviewed the above information.  Signed: Bethena Midget, OT  Filed 12/12/2021

## 2021-12-12 NOTE — Unmapped (Signed)
Daily Progress Note    Assessment/Plan:    Principal Problem:    Intractable abdominal pain  Active Problems:    [name]    Intestinal polyps    Dehydration    Physical deconditioning    Severe protein-calorie malnutrition (CMS-HCC)    Winged scapula of right side    Lower abdominal pain    Generalized anxiety disorder with panic attacks  Resolved Problems:    * No resolved hospital problems. *   Malnutrition Evaluation as performed by RD, LDN: Severe Protein-Calorie Malnutrition in the context of chronic illness (12/11/21 1143)             [name] [name] is a 21 y.o. female who presented to [facility] with Intractable abdominal pain.    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    - Appreciate GI / palliative care assessment  - Continue PO/IV dilaudid with preference to oral dilaudid when possible  - Continue gabapentin 300 at bedtime  - Tylenol 1000 TID  - Lidocaine Patch  - Heating pad   - Bowel regimen  - Continue Sorafenib (first dose today)   - Retime Reglan      Anxiety   - Continue zyprexa 2.5qhs  - Atarax 25 q6prn  - Rremeron 7.5 at bedtime    Severe Protein-Calorie Malnutrition in the context of chronic illness (12/11/21 1143)  Energy Intake: < or equal to 75% of estimated energy requirement for > or equal to 1 month  Interpretation of Wt. Loss: > 7.5% x 3 month  Malnutrition Score: 2    Wt Readings from Last 10 Encounters:   12/09/21 49.9 kg (110 lb)   12/03/21 51.6 kg (113 lb 12.8 oz)   10/23/21 52.3 kg (115 lb 6.4 oz)   10/02/21 53.1 kg (117 lb)   10/02/21 53.3 kg (117 lb 9.6 oz)   08/27/21 53.1 kg (117 lb)   08/01/21 57.2 kg (126 lb)   07/16/21 56.7 kg (125 lb)   06/28/21 56.9 kg (125 lb 6.4 oz)   06/21/21 56 kg (123 lb 8 oz)     At this time, we will continue to monitor intake.  No plans for advanced enteral nutrition at this time.    I personally spent more than 50 minutes in chart review, discussing and coordinating plan of care with consults / nursing / patient and/or family.      ___________________________________________________________________    Subjective:    No acute events overnight.  Had her first dose of sorafenib today, overall well tolerated but she took her reglan before and wasn't able to eat until an hour after the medicine, so had some nausea as a result of reglan on an empty stomach.      Recent Results (from the past 24 hour(s))   Basic Metabolic Panel    Collection Time: 12/11/21  4:21 PM   Result Value Ref Range    Sodium 141 135 - 145 mmol/L    Potassium 3.8 3.4 - 4.8 mmol/L    Chloride 108 (H) 98 - 107 mmol/L    CO2 24.0 20.0 - 31.0 mmol/L    Anion Gap 9 5 - 14 mmol/L    BUN 5 (L) 9 - 23 mg/dL    Creatinine 1.61 (L) 0.60 - 0.80 mg/dL    BUN/Creatinine Ratio 8     eGFR CKD-EPI (2021) Female >90 >=60 mL/min/1.85m2    Glucose  96 70 - 179 mg/dL    Calcium 9.2 8.7 - 16.1 mg/dL   T4, Free    Collection Time: 12/11/21  4:21 PM   Result Value Ref Range    Free T4 0.79 (L) 0.89 - 1.76 ng/dL   TSH    Collection Time: 12/11/21  4:21 PM   Result Value Ref Range    TSH 1.409 0.550 - 4.780 uIU/mL     Labs/Studies:  Labs and Studies from the last 24hrs per EMR and Reviewed    Objective:  Temp:  [36.5 ??C (97.7 ??F)-36.9 ??C (98.4 ??F)] 36.5 ??C (97.7 ??F)  Heart Rate:  [82-111] 86  Resp:  [18] 18  BP: (101-126)/(62-91) 125/68  SpO2:  [97 %] 97 %    General: No acute distress.  Appears stated age.    HEENT: MMM, oropharynx clear  Neck: Supple, no visible JVD  Cardiac: RRR, no M/R/G  Pulmonary: Normal work of breathing, no wheezes or crackles  Abdomen: No abdominal distention, pain, rebound or guarding.    Skin: No jaundice. No rashes or lesions.  Extremities: No edema, cyanosis, or clubbing. Warm.  Moderate evidence of fat / muscle wasting.   Neuro: CN 2-12 intact, no gross motor deficits  Psych: Oriented to self, place, and time. Normal speech and affect.

## 2021-12-12 NOTE — Unmapped (Signed)
Pt is alert and oriented x4. Pt complained of pain. Given prn dilaudid. Pt ambulated to the bathroom with assistance. Remains on room air. Will continue to monitor.    Problem: Adult Inpatient Plan of Care  Goal: Plan of Care Review  Outcome: Progressing  Goal: Patient-Specific Goal (Individualized)  Outcome: Progressing  Goal: Absence of Hospital-Acquired Illness or Injury  Outcome: Progressing  Intervention: Prevent and Manage VTE (Venous Thromboembolism) Risk  Recent Flowsheet Documentation  Taken 12/12/2021 0800 by Thomasene Ripple, RN  Activity Management: ambulated to bathroom  Goal: Optimal Comfort and Wellbeing  Outcome: Progressing  Goal: Readiness for Transition of Care  Outcome: Progressing  Goal: Rounds/Family Conference  Outcome: Progressing     Problem: Pain Acute  Goal: Acceptable Pain Control and Functional Ability  Outcome: Progressing     Problem: Latex Allergy  Goal: Absence of Allergy Symptoms  Outcome: Progressing     Problem: Oral Intake Inadequate  Goal: Improved Oral Intake  Outcome: Progressing     Problem: Infection  Goal: Absence of Infection Signs and Symptoms  Outcome: Progressing     Problem: Fall Injury Risk  Goal: Absence of Fall and Fall-Related Injury  Outcome: Progressing

## 2021-12-12 NOTE — Unmapped (Signed)
Palliative Care Progress Note      Consultation from Requesting Attending Physician:  [name] Mccoy, MD  Primary Care Provider:  Jarold [name], [facility]      Assessment/Plan:      SUMMARY:  [name] is seriously and acutely ill due to [name] s/p colectomy, desmoid tumors, complicated by co-morbid acute and chronic conditions including aneima, anxiety, chronic right shoulder pain, severe protein-calorie malnutrition, admitted with acute on chronic abdominal pain, palliative care consulted to assist with symptom management and support.       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 Worsens with eating - has only a short period after pain meds where she can eat. At home was taking oxy 5-10 mg up to 5x daily. Here, finds oxy gives only partial relief and relying more on IV dilaudid. Sees chronic pain outpatient - trigger point injections have helped significantly with R shoulder pain, planning for celiac plexus block and plan to eventually transition to butrans patch.  Feels PO dilaudid is significantly more effective than oxycodone was.   - Adjust dilaudid 3 mg PO to q3h PRN - discussed trying PO medication before IV  - Continue dilaudid 1 mg IV q4h PRN severe pain unrelieved by PO meds - ideally we will stop this IV option in the near future  - cont gabapentin to 300 mg nightly - may increase to TID if patinet continues to tolerate well  - Continue tylenol PO 1000 mg TID sch  - Continue lidocaine patch  - Heat, ice packs PRN     Nausea: Stable. Currently controlled on IV zofran, now doing a trial of PO reglan  - If trial of reglan 2.5mg  q8 causes too many side effects, could trial 1mg  PO oral solution q8 hrs.   - Okay to continue IV zofran while hospitalized    Constipation: Improved.   Recently has had softer stools, up to 5-7x daily is normal for her - Management per GI/primary, agree with daily scheduled miralax to maintain loose stools and trial of Reglan as above     Weakness/deconditioning; protein malnutrition:- related to long hospitalisations and malnutrition.   - Nutrition consulted, discussing supplementation to optimise nutritional status  - Would benefit from ongoing physical therapy given degree of deconditioning - consult PT inpatient for recs   - Defer TF deciion to nutrition/GI - patient open to this idea and would prefer to TPN     Anxiety - seen by [name], AYA SW and psychiatry this admission  - Olanzapine 2.5mg  nightly  - Continue mirtazapine 7.5mg  nightly  - Continue hydroxyzine 25mg  PRN q6h for acute anxiety/insomnia    Goals of care / ACP:  Code Status:   Code Status: Full Code   Healthcare decision-maker if lacks capacity:   HCDM [employer] policy, no known patient preference): Bajaj,[name] [name] - [phone]    HCDM, back-up (If primary HCDM is unavailable): Crist,[name] [name] - [phone]     Prognosis / prognostic understanding:  Appears to have good prognostic understanding     Current goals of care:  Looking forward to starting sorafenib, hopes chemotherapy will allow her to live as normal of a life as possible.            Practical, Emotional, Spiritual Support Recommendations:  Palliative care visit today included focused interview, active listening, therapeutic  use of silence, offering support, sharing empathy and summarization of today's discussion.  - Seen today with GI and primary team      Recommendations shared with primary team in person        Thank you for this consult. Please contact page Palliative Care if there are any questions.   Palliative Care team plans to visit this patient again on 10/6    Subjective:     Recent Events:    Overnight, patient reports that she did well and actually felt like pain was improving.  She found the p.o. Dilaudid and p.o. gabapentin to be effective and she was able to sleep for almost 5 hours.  However, she then had a pretty difficult morning and required several doses of IV dilaudid. After taking Reglan, she noted significant cramping and discomfort, and needed to urgently use the bathroom. Typically, she takes food with Reglan, but also received sorafenib this morning and was told she could not eat for 1 hour afterwards.        Objective:       Function:  80% - Ambulation: Full / Normal Activity with effort, some evidence of disease / Self-Care:Full / Intake: Normal or reduced / Level of Conscious: Full    Temp:  [36.8 ??C (98.2 ??F)-37.1 ??C (98.8 ??F)] 36.8 ??C (98.2 ??F)  Heart Rate:  [82-111] 82  Resp:  [17] 17  BP: (101-126)/(62-91) 101/62  SpO2:  [97 %] 97 %    Physical Exam:  Constitutional: Sitting up in bed, mild distress  Pulm: No increased work of breathing  CV: Distal extremities warm and well perfused  MS: Mild sarcopenia  Skin: No apparent rashes or lesions  Neuro: cognitive status alert and conversant  Psych: mood and affect calm    Testing reviewed and interpreted:   None today    I personally spent 50 minutes face-to-face and non-face-to-face in the care of this patient, which includes all pre, intra, and post visit time on the date of service.  All documented time was specific to the E/M visit and does not include any procedures that may have been performed.     See ACP Note from today for additional billable service:  No.

## 2021-12-13 MED ADMIN — HYDROmorphone (DILAUDID) tablet 3 mg: 3 mg | ORAL | @ 15:00:00 | Stop: 2021-12-26

## 2021-12-13 MED ADMIN — levoFLOXacin (LEVAQUIN) tablet 500 mg: 500 mg | ORAL | @ 12:00:00 | Stop: 2021-12-17

## 2021-12-13 MED ADMIN — ondansetron (ZOFRAN) injection 4 mg: 4 mg | INTRAVENOUS | @ 15:00:00

## 2021-12-13 MED ADMIN — HYDROmorphone (injection) injection 2 mg: 2 mg | INTRAVENOUS | @ 22:00:00 | Stop: 2021-12-13

## 2021-12-13 MED ADMIN — thiamine mononitrate (vit B1) tablet 100 mg: 100 mg | ORAL | @ 12:00:00 | Stop: 2021-12-17

## 2021-12-13 MED ADMIN — ondansetron (ZOFRAN) injection 4 mg: 4 mg | INTRAVENOUS | @ 03:00:00

## 2021-12-13 MED ADMIN — OLANZapine (ZyPREXA) tablet 2.5 mg: 2.5 mg | ORAL | @ 03:00:00

## 2021-12-13 MED ADMIN — HYDROmorphone (PF) (DILAUDID) injection 1 mg: 1 mg | INTRAVENOUS | @ 16:00:00 | Stop: 2021-12-19

## 2021-12-13 MED ADMIN — HYDROmorphone (PF) (DILAUDID) injection 1 mg: 1 mg | INTRAVENOUS | @ 12:00:00 | Stop: 2021-12-19

## 2021-12-13 MED ADMIN — mirtazapine (REMERON) tablet 7.5 mg: 7.5 mg | ORAL | @ 03:00:00

## 2021-12-13 MED ADMIN — HYDROmorphone (PF) (DILAUDID) injection 1 mg: 1 mg | INTRAVENOUS | @ 20:00:00 | Stop: 2021-12-19

## 2021-12-13 MED ADMIN — diphenhydrAMINE (BENADRYL) injection: 25 mg | INTRAVENOUS | @ 06:00:00 | Stop: 2021-12-13

## 2021-12-13 MED ADMIN — famotidine (PEPCID) tablet 20 mg: 20 mg | ORAL | @ 03:00:00

## 2021-12-13 MED ADMIN — acetaminophen (TYLENOL) tablet 1,000 mg: 1000 mg | ORAL | @ 12:00:00

## 2021-12-13 MED ADMIN — HYDROmorphone (DILAUDID) tablet 3 mg: 3 mg | ORAL | @ 01:00:00 | Stop: 2021-12-26

## 2021-12-13 MED ADMIN — SORAfenib (NexAVAR) tablet 200 mg **PATIENT SUPPLIED**: 200 mg | ORAL | @ 12:00:00

## 2021-12-13 MED ADMIN — HYDROmorphone (PF) (DILAUDID) injection 1 mg: 1 mg | INTRAVENOUS | @ 03:00:00 | Stop: 2021-12-19

## 2021-12-13 MED ADMIN — ondansetron (ZOFRAN) injection 4 mg: 4 mg | INTRAVENOUS | @ 22:00:00

## 2021-12-13 MED ADMIN — acetaminophen (TYLENOL) tablet 1,000 mg: 1000 mg | ORAL | @ 03:00:00

## 2021-12-13 MED ADMIN — lidocaine (LIDODERM) 5 % patch 1 patch: 1 | TRANSDERMAL | @ 01:00:00

## 2021-12-13 MED ADMIN — ondansetron (ZOFRAN) injection 4 mg: 4 mg | INTRAVENOUS | @ 09:00:00

## 2021-12-13 MED ADMIN — gabapentin (NEURONTIN) capsule 300 mg: 300 mg | ORAL | @ 03:00:00

## 2021-12-13 NOTE — Unmapped (Signed)
Additional order received and the patient is already on the caseload. Continue with  plan of care. Thanks!

## 2021-12-13 NOTE — Unmapped (Signed)
Pt is stable. Pt endorses increased abdominal pain and cramping this shift, as well as more frequent bowel movements today, some relief from current pain regimen. Pt denies nausea. Pt complains of itching on back, skin is intact. LIP notified, PRN benadryl given, pt reports relief. No concerns at this time.    Problem: Adult Inpatient Plan of Care  Goal: Plan of Care Review  Outcome: Ongoing - Unchanged  Flowsheets (Taken 12/13/2021 0430)  Progress: no change  Plan of Care Reviewed With: patient     Problem: Adult Inpatient Plan of Care  Goal: Patient-Specific Goal (Individualized)  Outcome: Ongoing - Unchanged  Flowsheets (Taken 12/13/2021 0430)  Patient-Specific Goals (Include Timeframe): Pt will remain free from falls/injuries this shift  Individualized Care Needs: pain and nausea management  Anxieties, Fears or Concerns: increased pain today, itching on back

## 2021-12-13 NOTE — Unmapped (Signed)
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 On empiric levaquin given dilated loops of bowel on CTAP to prevent bacterial translocation. S/p Pouchoscopy 12/12/2021 with normal mucosa of both efferent and afferent limb, no evidence of stricture. Two small ulcers were found at anastomosis and in efferent limb without evidence of bleeding.     She is continue to struggle with crampy abdominal pain that is likely multifactorial from desmoid tumors as well as secondary to a disorders of the gut brain axis and visceral hypersensitivity. Given her recent weight loss and inability to keep up with nutritional requirements, nutrition has recommended starting tube feeds. We discussed her case with her primary GI physician Dr. [name] and he believes it is reasonable to start NG tube feeds to try to avoid TPN.    S/p Corpak placement at bedside. Can start feeds once placement is confirmed.     Recommendations:  KUB to confirm Corpak placement. Once Corpak position confirmed, can start tube feeds  Hyoscamine PRN for cramping pain  Agree with palliative care in switching to a scheduled PO pain medications and trying to avoid IV pain medications  Continue small frequent meals  Chemotherapy of desmoid tumors per oncology  Levofloxacin x 10 days for treatment of SIBO    I have communicated recommendations with the patient's primary team    Thank you for involving [name] in the care of your patient. We will continue to follow along with you.     For questions, contact the on-call fellow for the Gastroenterology (Luminal) Consult Service at [phone].     Caryn Section, GI fellow    Interval History:   Had severe crampy abdominal pain yesterday that was associated with urgency and continued to have crampy abdominal pain for 4 hours. Had 8 bowel movements yesterday. Will plan to stop Miralax and reglan today and see how if the crampy abdominal pain improves.    Objective:   Temp:  [36.3 ??C (97.3 ??F)] 36.3 ??C (97.3 ??F)  Heart Rate:  [80-82] 82  Resp:  [16-18] 16  BP: (115-147)/(71-72) 115/71  SpO2:  [96 %-99 %] 99 %    Gen: WDWN in NAD, answers questions appropriately  Abdomen: Soft, NTND.   Extremities: No edema in the BLEs    Pertinent Labs/Studies Reviewed:   As above

## 2021-12-13 NOTE — Unmapped (Signed)
Palliative Care Progress Note      Consultation from Requesting Attending Physician:  Georgiann Mccoy, MD  Primary Care Provider:  Jarold Motto, [facility]      Assessment/Plan:      SUMMARY:  [name] is seriously and acutely ill due to [name] s/p colectomy, desmoid tumors, complicated by co-morbid acute and chronic conditions including aneima, anxiety, chronic right shoulder pain, severe protein-calorie malnutrition, admitted with acute on chronic abdominal pain, palliative care consulted to assist with symptom management and support.       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 Worsens with eating - has only a short period after pain meds where she can eat. At home was taking oxy 5-10 mg up to 5x daily. Here, finds oxy gives only partial relief and relying more on IV dilaudid. Sees chronic pain outpatient - trigger point injections have helped significantly with R shoulder pain, planning for celiac plexus block and plan to eventually transition to butrans patch.  Feels PO dilaudid is significantly more effective than oxycodone was, still using IV intermittently for acute spikes in pain.   - Continue dilaudid 3 mg PO to q3h PRN - discussed trying PO medication before IV  - Continue dilaudid 1 mg IV q4h PRN severe pain unrelieved by PO meds - ideally we will stop this IV option in the near future  - Continue gabapentin 300 mg nightly - can increase to 100 mg morning and afternoon, 300 at night if continues to be well tolerated   - Continue tylenol PO 1000 mg TID sch  - Continue lidocaine patch  - Heat, ice packs PRN     Nausea: Stable. Currently controlled on IV zofran, now doing a trial of PO reglan  - If trial of reglan 2.5mg  q8 causes too many side effects, could trial 1mg  PO oral solution q8 hrs.   - Okay to continue IV zofran while hospitalized    Constipation: Improved.   Recently has had softer stools, up to 5-7x daily is normal for her   - Management per GI/primary, agree with daily scheduled miralax to maintain loose stools and trial of Reglan as above     Weakness/deconditioning; protein malnutrition:- related to long hospitalisations and malnutrition.   - Nutrition consulted, NGT placement planned to try to supplement nutrition  - Would benefit from ongoing physical therapy given degree of deconditioning - working with PT inpatient      Anxiety - seen by [name], AYA SW and psychiatry this admission  - Olanzapine 2.5mg  nightly  - Continue mirtazapine 7.5mg  nightly  - Continue hydroxyzine 25mg  PRN q6h for acute anxiety/insomnia    Goals of care / ACP:  Code Status:   Code Status: Full Code   Healthcare decision-maker if lacks capacity:   HCDM [employer] policy, no known patient preference): Mahurin,[name] - Mother - [phone]    HCDM, back-up (If primary HCDM is unavailable): Hale,Jeff - Father - [phone]     Prognosis / prognostic understanding:  Appears to have good prognostic understanding     Current goals of care:  Looking forward to starting sorafenib, hopes chemotherapy will allow her to live as normal of a life as possible.          Practical, Emotional, Spiritual Support Recommendations:  Palliative care visit today included focused interview, active listening, therapeutic use  of silence, offering support, sharing empathy and summarization of today's discussion.  - To be seen by pet therapy over the weekend     Recommendations shared with primary team via Epic chat        Thank you for this consult. Palliative care will sign off. Please page if there are any questions.     Subjective:     Recent Events:    Reports cramping pain and needing to use the bathroom urgently after reglan when she tried to go downstairs yesterday and required IV dilaudid then. Did also try baclofen, unsure what helped most, but was able to sleep well not requiring meds between 11 pm and 8 am. Looking forward to starting NG feeds though somewhat apprehensive.      Objective:       Function:  80% - Ambulation: Full / Normal Activity with effort, some evidence of disease / Self-Care:Full / Intake: Normal or reduced / Level of Conscious: Full    Temp:  [36.8 ??C (98.2 ??F)-37.1 ??C (98.8 ??F)] 36.8 ??C (98.2 ??F)  Heart Rate:  [82-111] 82  Resp:  [17] 17  BP: (101-126)/(62-91) 101/62  SpO2:  [97 %] 97 %    Physical Exam:  Constitutional: Sitting up in bed, tired appearing  Pulm: No increased work of breathing  CV: Distal extremities warm and well perfused  MS: Mild sarcopenia  Skin: No apparent rashes or lesions  Neuro: cognitive status alert and conversant  Psych: mood and affect calm    Testing reviewed and interpreted:   None today    I personally spent 50 minutes face-to-face and non-face-to-face in the care of this patient, which includes all pre, intra, and post visit time on the date of service.  All documented time was specific to the E/M visit and does not include any procedures that may have been performed.     See ACP Note from today for additional billable service:  No.

## 2021-12-13 NOTE — Unmapped (Signed)
Daily Progress Note    Assessment/Plan:    Principal Problem:    Intractable abdominal pain  Active Problems:    [name]    Intestinal polyps    Dehydration    Physical deconditioning    Severe protein-calorie malnutrition (CMS-HCC)    Winged scapula of right side    Lower abdominal pain    Generalized anxiety disorder with panic attacks  Resolved Problems:    * No resolved hospital problems. *   Malnutrition Evaluation as performed by RD, LDN: Severe Protein-Calorie Malnutrition in the context of chronic illness (12/11/21 1143)             [name] [name] is a 21 y.o. female who presented to [facility] with Intractable abdominal pain.    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    - Appreciate GI / palliative care assessment  - Continue PO/IV dilaudid with preference to oral dilaudid when possible  - Continue gabapentin 300 at bedtime  - Tylenol 1000 TID  - Lidocaine Patch  - Heating pad   - Bowel regimen  - Continue Sorafenib   - Start hyocyamine  - Hold reglan/miralax       Anxiety   - Continue zyprexa 2.5qhs  - Atarax 25 q6prn  - Rremeron 7.5 at bedtime    Severe Protein-Calorie Malnutrition in the context of chronic illness (12/11/21 1143)  Energy Intake: < or equal to 75% of estimated energy requirement for > or equal to 1 month  Interpretation of Wt. Loss: > 7.5% x 3 month  Malnutrition Score: 2  - Corpak placed today, will plan to initiate tube feeds    Wt Readings from Last 10 Encounters:   12/09/21 49.9 kg (110 lb)   12/03/21 51.6 kg (113 lb 12.8 oz)   10/23/21 52.3 kg (115 lb 6.4 oz)   10/02/21 53.1 kg (117 lb)   10/02/21 53.3 kg (117 lb 9.6 oz)   08/27/21 53.1 kg (117 lb)   08/01/21 57.2 kg (126 lb)   07/16/21 56.7 kg (125 lb)   06/28/21 56.9 kg (125 lb 6.4 oz)   06/21/21 56 kg (123 lb 8 oz)       At this time, we will continue to monitor intake.  No plans for advanced enteral nutrition at this time.    I personally spent more than 50 minutes in chart review, discussing and coordinating plan of care with consults / nursing / patient and/or family.      ___________________________________________________________________    Subjective:    No acute events overnight. NGT placed by GI team at bedside. Ongoing pain.      No results found for this or any previous visit (from the past 24 hour(s)).    Labs/Studies:  Labs and Studies from the last 24hrs per EMR and Reviewed    Objective:  Temp:  [36.3 ??C (97.3 ??F)-36.8 ??C (98.2 ??F)] 36.8 ??C (98.2 ??F)  Heart Rate:  [80-84] 84  Resp:  [15-18] 15  BP: (115-147)/(71-72) 116/71  SpO2:  [96 %-100 %] 100 %    General: No acute distress.  Appears stated age.    HEENT: MMM, oropharynx clear  Neck: Supple, no visible JVD  Cardiac: RRR, no M/R/G  Pulmonary: Normal work of breathing, no wheezes or crackles  Abdomen: No abdominal distention, pain, rebound or guarding.    Skin: No jaundice. No rashes or lesions.  Extremities: No  edema, cyanosis, or clubbing. Warm.  Moderate evidence of fat / muscle wasting.   Neuro: CN 2-12 intact, no gross motor deficits  Psych: Oriented to self, place, and time. Normal speech and affect.

## 2021-12-13 NOTE — Unmapped (Signed)
Oncology Consult Note    Requesting Attending Physician :  Georgiann Mccoy, MD  Service Requesting Consult : Med [facility])  Reason for Consult: care coordination  Primary Oncologist: Dr. [name]    Assessment: [name] [name] is a 21 y.o. 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 Oncology consulted for care coordination and systemic therapy.    Recommendations:   - trial of NG tube to meet caloric needs in agreement with nutrition, GI, and patient; planned to start today  - continue sorafenib for systemic therapy given contribution of tumor burden to overall clinical picture  - continue multimodal pain regimen, with eye towards gradually transitioning from IV to PO opioids pending patient tolerance; also with consideration for incorporating long-acting opioids to meet daily pain control needs   - agree with going off unit, eg courtyard visits as tolerated   - reasonable to pause Reglan given temporal association between cramping GI symptoms and Reglan administration    This patient has been staffed with Dr. [name]  These recommendations were discussed with the primary team.     Please contact the oncology consult fellow at [phone] with any further questions.    Freddy Finner, MD  Oncology Fellow    -------------------------------------------------------------    HPI: [name] [name] is a 21 y.o. female and who is being seen at the request of Georgiann Mccoy, MD for evaluation of GI symptoms.     She has a history of [name] s/p IPAA who presents for abdominal pain, nausea/emesis/diarrhea and inadequate PO due to these symptoms.    Follows with Dr. [name] as outpatient. Admitted for ongoing symptoms in setting of complex intraabdominal malignancy complicated by likely disorders of gut-brain axis, visceral hypersensitivity, and post-surgical adhesions. She was started on empiric levaquin for SIBO concern based on imaging. She had pouchoscopy performed 12/11/2021 without evidence of stricture; 2 small nonbleeding ulcers near anastomosis. Due to inability to meet daily caloric needs with PO intake, planned for NGT trial.    She has also started systemic therapy with sorafenib given increased desmoid burden on imaging, tolerating thus far.    12/06/2021 CT abdomen:  - Sequelae of prior colectomy with ileoanal anastomosis. No evidence of bowel obstruction.  - Unchanged enteric soft tissue lesions with infiltrative features, favored to reflect desmoids.  - 1.8 cm enhancing nodularity in the lower right rectus musculature, favoring  a rectus desmoid, seen to better advantage on this postcontrast CT examination. Additional nodularity in the upper right rectus may be postsurgical or reflect  small desmoids.    Review of Systems: All positive and pertinent negatives are noted in the HPI; a 10 system review of systems was otherwise negative except as noted in HPI.    Oncologic History:  Oncology History    No history exists.       Past Medical History:   Diagnosis Date    Abdominal pain     Acid reflux     occas    Anesthesia complication     itching, shaking, coldness; last few surgeries have gone much better    Cataract of right eye     COVID-19 virus infection 01/2019    Cyst of thyroid determined by ultrasound     monitoring    [name]     2 right forearm, 1 left thigh, 1 right scapula, 1 under  left clavicle; multiple    Difficult intravenous access     [name] (familial adenomatous polyposis)     [name]     Gastric polyps     History of chemotherapy     last treatment approx 05/2019    History of colon polyps     History of COVID-19 01/2019    Iron deficiency anemia due to chronic blood loss     received iron infusion 11-2019    PONV (postoperative nausea and vomiting)     Rectal bleeding     Syncopal episodes     especially if becoming dehydrated       Past Surgical History:   Procedure Laterality Date    cyroablation      cystis removal      desmoid removal      PR CLOSE ENTEROSTOMY,RESEC+ANAST N/A 10/09/2020    Procedure: ILEOSTOMY TAKEDOWN;  Surgeon: Mickle Asper, MD;  Location: OR [place];  Service: General Surgery    PR COLONOSCOPY W/BIOPSY SINGLE/MULTIPLE N/A 10/27/2012    Procedure: COLONOSCOPY, FLEXIBLE, PROXIMAL TO SPLENIC FLEXURE; WITH BIOPSY, SINGLE OR MULTIPLE;  Surgeon: Shirlyn Goltz Mir, MD;  Location: PEDS PROCEDURE ROOM [place];  Service: Gastroenterology    PR COLONOSCOPY W/BIOPSY SINGLE/MULTIPLE N/A 09/14/2013    Procedure: COLONOSCOPY, FLEXIBLE, PROXIMAL TO SPLENIC FLEXURE; WITH BIOPSY, SINGLE OR MULTIPLE;  Surgeon: Shirlyn Goltz Mir, MD;  Location: PEDS PROCEDURE ROOM [place];  Service: Gastroenterology    PR COLONOSCOPY W/BIOPSY SINGLE/MULTIPLE N/A 11/08/2014    Procedure: COLONOSCOPY, FLEXIBLE, PROXIMAL TO SPLENIC FLEXURE; WITH BIOPSY, SINGLE OR MULTIPLE;  Surgeon: Arnold Long Mir, MD;  Location: PEDS PROCEDURE ROOM [facility];  Service: Gastroenterology    PR COLONOSCOPY W/BIOPSY SINGLE/MULTIPLE N/A 12/26/2015    Procedure: COLONOSCOPY, FLEXIBLE, PROXIMAL TO SPLENIC FLEXURE; WITH BIOPSY, SINGLE OR MULTIPLE;  Surgeon: Arnold Long Mir, MD;  Location: PEDS PROCEDURE ROOM [facility];  Service: Gastroenterology    PR COLONOSCOPY W/BIOPSY SINGLE/MULTIPLE N/A 09/02/2017    Procedure: COLONOSCOPY, FLEXIBLE, PROXIMAL TO SPLENIC FLEXURE; WITH BIOPSY, SINGLE OR MULTIPLE;  Surgeon: Arnold Long Mir, MD;  Location: PEDS PROCEDURE ROOM ;  Service: Gastroenterology    PR COLSC FLX W/REMOVAL LESION BY HOT BX FORCEPS N/A 08/27/2016    Procedure: COLONOSCOPY, FLEXIBLE, PROXIMAL TO SPLENIC FLEXURE; W/REMOVAL TUMOR/POLYP/OTHER LESION, HOT BX FORCEP/CAUTE;  Surgeon: Arnold Long Mir, MD;  Location: PEDS PROCEDURE ROOM [facility];  Service: Gastroenterology    PR COLSC FLX W/RMVL OF TUMOR POLYP LESION SNARE TQ N/A 02/25/2019    Procedure: COLONOSCOPY FLEX; W/REMOV TUMOR/LES BY SNARE;  Surgeon: Helyn Numbers, MD;  Location: GI PROCEDURES [place] [facility];  Service: Gastroenterology    PR COLSC FLX W/RMVL OF TUMOR POLYP LESION SNARE TQ N/A 03/13/2020    Procedure: COLONOSCOPY FLEX; W/REMOV TUMOR/LES BY SNARE;  Surgeon: Helyn Numbers, MD;  Location: [place] PROCEDURES [place] [facility];  Service: Gastroenterology    PR EXC SKIN BENIG 2.1-3 CM TRUNK,ARM,LEG Right 02/25/2017    Procedure: EXCISION, BENIGN LESION INCLUDE MARGINS, EXCEPT SKIN TAG, LEGS; EXCISED DIAMETER 2.1 TO 3.0 CM;  Surgeon: Clarene Duke, MD;  Location: CHILDRENS OR [facility];  Service: Plastics    PR EXC SKIN BENIG 3.1-4 CM TRUNK,ARM,LEG Right 02/25/2017    Procedure: EXCISION, BENIGN LESION INCLUDE MARGINS, EXCEPT SKIN TAG, ARMS; EXCISED DIAMETER 3.1 TO 4.0 CM;  Surgeon: Clarene Duke, MD;  Location: CHILDRENS OR [facility];  Service: Plastics    PR EXC SKIN BENIG >4 CM FACE,FACIAL Right 02/25/2017    Procedure: EXCISION, OTHER BENIGN LES INCLUD MARGIN,  FACE/EARS/EYELIDS/NOSE/LIPS/MUCOUS MEMBRANE; EXCISED DIAM >4.0 CM;  Surgeon: Clarene Duke, MD;  Location: CHILDRENS OR [facility];  Service: Plastics    PR EXC TUMOR SOFT TISSUE LEG/ANKLE SUBQ 3+CM Right 08/05/2019    Procedure: EXCISION, TUMOR, SOFT TISSUE OF LEG OR ANKLE AREA, SUBCUTANEOUS; 3 CM OR GREATER;  Surgeon: Arsenio Katz, MD;  Location: MAIN OR [place];  Service: Plastics    PR EXC TUMOR SOFT TISSUE LEG/ANKLE SUBQ <3CM Right 08/05/2019    Procedure: EXCISION, TUMOR, SOFT TISSUE OF LEG OR ANKLE AREA, SUBCUTANEOUS; LESS THAN 3 CM;  Surgeon: Arsenio Katz, MD;  Location: MAIN OR [facility];  Service: Plastics    PR LAP, SURG PROCTECTOMY W J-POUCH N/A 08/10/2020    Procedure: ROBOTIC ASSISTED LAPAROSCOPIC PROCTOCOLECTOMY, ILEAL J POUCH, WITH OSTOMY;  Surgeon: Mickle Asper, MD;  Location: OR [place];  Service: General Surgery    PR NDSC EVAL INTSTINAL POUCH DX W/COLLJ SPEC SPX N/A 01/23/2021    Procedure: ENDO EVAL SM INTEST POUCH; DX;  Surgeon: Modena Nunnery, MD;  Location: GI PROCEDURES [place] [facility];  Service: Gastroenterology    PR NDSC EVAL INTSTINAL POUCH DX W/COLLJ SPEC SPX N/A 08/27/2021    Procedure: ENDO EVAL SM INTEST POUCH; DX;  Surgeon: Hunt Oris, MD;  Location: [facility]URES [facility];  Service: Gastroenterology    PR NDSC EVAL INTSTINAL POUCH DX W/COLLJ SPEC SPX N/A 12/09/2021    Procedure: ENDO EVAL SM INTEST POUCH; DX;  Surgeon: Vidal Schwalbe, MD;  Location: GI PROC[facility]URES [facility];  Service: Gastroenterology    PR UNLISTED PROCEDURE SMALL INTESTINE  01/23/2021    Procedure: UNLISTED PROCEDURE, SMALL INTESTINE;  Surgeon: Modena Nunnery, MD;  Location: [place] PROCEDURES [place] [facility];  Service: Gastroenterology    PR UPPER GI ENDOSCOPY,BIOPSY N/A 10/27/2012    Procedure: UGI ENDOSCOPY; WITH BIOPSY, SINGLE OR MULTIPLE;  Surgeon: Shirlyn Goltz Mir, MD;  Location: PEDS PROCEDURE ROOM [facility];  Service: Gastroenterology    PR UPPER GI ENDOSCOPY,BIOPSY N/A 09/14/2013    Procedure: UGI ENDOSCOPY; WITH BIOPSY, SINGLE OR MULTIPLE;  Surgeon: Shirlyn Goltz Mir, MD;  Location: PEDS PROCEDURE ROOM [facility];  Service: Gastroenterology    PR UPPER GI ENDOSCOPY,BIOPSY N/A 11/08/2014    Procedure: UGI ENDOSCOPY; WITH BIOPSY, SINGLE OR MULTIPLE;  Surgeon: Arnold Long Mir, MD;  Location: PEDS PROCEDURE ROOM [facility];  Service: Gastroenterology    PR UPPER GI ENDOSCOPY,BIOPSY N/A 12/26/2015    Procedure: UGI ENDOSCOPY; WITH BIOPSY, SINGLE OR MULTIPLE;  Surgeon: Arnold Long Mir, MD;  Location: PEDS PROCEDURE ROOM [facility];  Service: Gastroenterology    PR UPPER GI ENDOSCOPY,BIOPSY N/A 08/27/2016    Procedure: UGI ENDOSCOPY; WITH BIOPSY, SINGLE OR MULTIPLE;  Surgeon: Arnold Long Mir, MD;  Location: PEDS PROCEDURE ROOM [facility];  Service: Gastroenterology    PR UPPER GI ENDOSCOPY,BIOPSY N/A 09/02/2017    Procedure: UGI ENDOSCOPY; WITH BIOPSY, SINGLE OR MULTIPLE;  Surgeon: Arnold Long Mir, MD;  Location: PEDS PROCEDURE ROOM [facility]; Service: Gastroenterology    PR UPPER GI ENDOSCOPY,BIOPSY N/A 03/13/2020    Procedure: UGI ENDOSCOPY; WITH BIOPSY, SINGLE OR MULTIPLE;  Surgeon: Helyn Numbers, MD;  Location: GI PROCEDURES [place] [facility];  Service: Gastroenterology    PR UPPER GI ENDOSCOPY,BIOPSY N/A 09/05/2021    Procedure: UGI ENDOSCOPY; WITH BIOPSY, SINGLE OR MULTIPLE;  Surgeon: Wendall Papa, MD;  Location: GI PROC[facility]URES [facility];  Service: Gastroenterology    TUMOR REMOVAL      multiple-head, neck, back, hand, right flank, multiple       Family  History   Problem Relation Age of Onset    No Known Problems Mother     No Known Problems Father     No Known Problems Sister     No Known Problems Brother     Stroke Maternal Grandmother     Other Maternal Grandmother         benign lesions of liver and pancreas, further details unknown    Cancer Maternal Grandmother     Diabetes Maternal Grandmother     Hypertension Maternal Grandmother     Thyroid disease Maternal Grandmother     Arthritis Maternal Grandfather     Asthma Maternal Grandfather     COPD Paternal Grandmother         Deceased    Miscarriages / Stillbirths Paternal Grandmother     Alcohol abuse Paternal Grandfather         Deceased    No Known Problems Maternal Aunt     No Known Problems Maternal Uncle     No Known Problems Paternal Aunt     No Known Problems Paternal Uncle     Anesthesia problems Neg Hx     Broken bones Neg Hx     Cancer Neg Hx     Clotting disorder Neg Hx     Collagen disease Neg Hx     Diabetes Neg Hx     Dislocations Neg Hx     Fibromyalgia Neg Hx     Gout Neg Hx     Hemophilia Neg Hx     Osteoporosis Neg Hx     Rheumatologic disease Neg Hx     Scoliosis Neg Hx     Severe sprains Neg Hx     Sickle cell anemia Neg Hx     Spinal Compression Fracture Neg Hx     Melanoma Neg Hx     Basal cell carcinoma Neg Hx     Squamous cell carcinoma Neg Hx           Social History     Socioeconomic History    Marital status: Single   Tobacco Use    Smoking status: Never Passive exposure: Past    Smokeless tobacco: Never   Vaping Use    Vaping Use: Never used   Substance and Sexual Activity    Alcohol use: Never    Drug use: Never    Sexual activity: Never   Other Topics Concern    Do you use sunscreen? Yes    Tanning bed use? No    Are you easily burned? No    Excessive sun exposure? No    Blistering sunburns? 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       Allergies: is allergic to adhesive tape-silicones; ferrlecit [sodium ferric gluconat-sucrose]; methylnaltrexone; neomycin; papaya; morphine; zosyn [piperacillin-tazobactam]; compazine [prochlorperazine]; latex, natural rubber; and opioids - morphine analogues.    Medications:   Meds:   acetaminophen  1,000 mg Oral TID    diclofenac sodium  2 g Topical QID    famotidine  20 mg Oral Nightly    gabapentin  300 mg Oral Nightly    levoFLOXacin  500 mg Oral Daily    lidocaine  1 patch Transdermal Q24H    mirtazapine  7.5 mg Oral Nightly    OLANZapine  2.5 mg Oral Nightly    ondansetron  4 mg Intravenous Q6H    SORAfenib  200 mg Oral Daily    thiamine mononitrate (vit B1)  100 mg Oral Daily     Continuous Infusions:   IP okay to treat      lactated Ringers 10 mL/hr (12/10/21 2034)    sodium chloride       PRN Meds:.baclofen, calcium carbonate, dexAMETHasone, diphenhydrAMINE, diphenhydrAMINE, EPINEPHrine IM, hydrocortisone, HYDROmorphone, HYDROmorphone, hydrOXYzine, hyoscyamine, IP okay to treat, ketorolac, methylPREDNISolone sodium succinate, sodium chloride, sodium chloride 0.9%    Objective:   Vitals: Temp:  [36.3 ??C (97.3 ??F)-36.8 ??C (98.2 ??F)] 36.8 ??C (98.2 ??F)  Heart Rate:  [80-84] 84  Resp:  [15-18] 15  BP: (115-147)/(71-72) 116/71  MAP (mmHg):  [83-85] 85  SpO2:  [96 %-100 %] 100 %  No intake/output data recorded.    Physical Exam:  BP 116/71  - Pulse 84  - Temp 36.8 ??C (98.2 ??F) (Oral)  - Resp 15  - Ht 165.1 cm (5' 5)  - Wt 49.9 kg (110 lb)  - SpO2 100%  - BMI 18.30 kg/m??      Physical Exam:  General: Thin appearing adult in no acute distress  HEENT: PERRLA, normocephalic atraumatic  CV: RRR, normal s1s2 no m/r/g  Pulm: Clear to auscultation bilaterally.   Abdominal: nontender, nondistended. +BS.  Extremities: WWP. No edema.  Neuro: AOx3, strength and sensation intact                          No focal deficits      ECOG Performance Status: 1 - Restricted in physically strenuous activity but ambulatory and able to carry out work of a light or sedentary nature, e.g., light house work, office work    Pharmacist, community  Lab Results   Component Value Date    WBC 5.3 12/09/2021    HGB 12.9 12/09/2021    HCT 38.8 12/09/2021    PLT 187 12/09/2021     Lab Results   Component Value Date    NA 141 12/11/2021    K 3.8 12/11/2021    CL 108 (H) 12/11/2021    CO2 24.0 12/11/2021    BUN 5 (L) 12/11/2021    CREATININE 0.59 (L) 12/11/2021    GLU 96 12/11/2021    CALCIUM 9.2 12/11/2021    MG 1.8 09/06/2021    PHOS 3.3 05/13/2021     Lab Results   Component Value Date    BILITOT 0.4 12/09/2021    BILIDIR 0.20 12/04/2021    PROT 5.7 12/09/2021    ALBUMIN 3.4 12/09/2021    ALT 81 (H)  12/09/2021    AST 27 12/09/2021    ALKPHOS 85 12/09/2021    GGT 13 10/31/2011     Lab Results Component Value Date    INR 1.05 10/10/2020    APTT 24.1 10/10/2020       Imaging: Radiology studies were personally reviewed

## 2021-12-13 NOTE — Unmapped (Signed)
Pt is alert and oriented x4. Pt received prn meds for pain and scheduled nausea meds. NG tube inserted by provider. KUB ordered to check for placement. Mother in room visiting. Will continue to monitor.      Problem: Adult Inpatient Plan of Care  Goal: Plan of Care Review  Outcome: Progressing  Goal: Patient-Specific Goal (Individualized)  Outcome: Progressing  Goal: Absence of Hospital-Acquired Illness or Injury  Outcome: Progressing  Intervention: Prevent and Manage VTE (Venous Thromboembolism) Risk  Recent Flowsheet Documentation  Taken 12/13/2021 0800 by Thomasene Ripple, RN  Activity Management: activity encouraged  Goal: Optimal Comfort and Wellbeing  Outcome: Progressing  Goal: Readiness for Transition of Care  Outcome: Progressing  Goal: Rounds/Family Conference  Outcome: Progressing     Problem: Pain Acute  Goal: Acceptable Pain Control and Functional Ability  Outcome: Progressing     Problem: Latex Allergy  Goal: Absence of Allergy Symptoms  Outcome: Progressing     Problem: Oral Intake Inadequate  Goal: Improved Oral Intake  Outcome: Progressing     Problem: Infection  Goal: Absence of Infection Signs and Symptoms  Outcome: Progressing     Problem: Fall Injury Risk  Goal: Absence of Fall and Fall-Related Injury  Outcome: Progressing

## 2021-12-14 MED ADMIN — ondansetron (ZOFRAN) injection 4 mg: 4 mg | INTRAVENOUS | @ 03:00:00

## 2021-12-14 MED ADMIN — mirtazapine (REMERON) tablet 7.5 mg: 7.5 mg | ORAL | @ 03:00:00

## 2021-12-14 MED ADMIN — lidocaine (LIDODERM) 5 % patch 1 patch: 1 | TRANSDERMAL | @ 03:00:00

## 2021-12-14 MED ADMIN — SORAfenib (NexAVAR) tablet 200 mg **PATIENT SUPPLIED**: 200 mg | ORAL | @ 13:00:00

## 2021-12-14 MED ADMIN — HYDROmorphone (PF) (DILAUDID) injection 1 mg: 1 mg | INTRAVENOUS | @ 06:00:00 | Stop: 2021-12-19

## 2021-12-14 MED ADMIN — HYDROmorphone (PF) (DILAUDID) injection 1 mg: 1 mg | INTRAVENOUS | @ 19:00:00 | Stop: 2021-12-19

## 2021-12-14 MED ADMIN — HYDROmorphone (PF) (DILAUDID) injection 1 mg: 1 mg | INTRAVENOUS | @ 03:00:00 | Stop: 2021-12-19

## 2021-12-14 MED ADMIN — phenoL (CHLORASEPTIC) 1.4 % spray 2 spray: 2

## 2021-12-14 MED ADMIN — OLANZapine (ZyPREXA) tablet 2.5 mg: 2.5 mg | ORAL | @ 03:00:00

## 2021-12-14 MED ADMIN — HYDROmorphone (DILAUDID) tablet 3 mg: 3 mg | ORAL | @ 18:00:00 | Stop: 2021-12-26

## 2021-12-14 MED ADMIN — ondansetron (ZOFRAN) injection 4 mg: 4 mg | INTRAVENOUS | @ 13:00:00

## 2021-12-14 MED ADMIN — thiamine mononitrate (vit B1) tablet 100 mg: 100 mg | ORAL | @ 14:00:00 | Stop: 2021-12-17

## 2021-12-14 MED ADMIN — ondansetron (ZOFRAN) injection 4 mg: 4 mg | INTRAVENOUS | @ 18:00:00

## 2021-12-14 MED ADMIN — HYDROmorphone (DILAUDID) tablet 3 mg: 3 mg | ORAL | @ 14:00:00 | Stop: 2021-12-26

## 2021-12-14 MED ADMIN — famotidine (PEPCID) tablet 20 mg: 20 mg | ORAL | @ 03:00:00

## 2021-12-14 MED ADMIN — HYDROmorphone (PF) (DILAUDID) injection 1 mg: 1 mg | INTRAVENOUS | @ 13:00:00 | Stop: 2021-12-19

## 2021-12-14 MED ADMIN — HYDROmorphone (PF) (DILAUDID) injection 1 mg: 1 mg | INTRAVENOUS | @ 22:00:00 | Stop: 2021-12-19

## 2021-12-14 MED ADMIN — gabapentin (NEURONTIN) capsule 300 mg: 300 mg | ORAL | @ 03:00:00

## 2021-12-14 MED ADMIN — HYDROmorphone (PF) (DILAUDID) injection 1 mg: 1 mg | INTRAVENOUS | @ 16:00:00 | Stop: 2021-12-19

## 2021-12-14 MED ADMIN — levoFLOXacin (LEVAQUIN) tablet 500 mg: 500 mg | ORAL | @ 14:00:00 | Stop: 2021-12-17

## 2021-12-14 MED ADMIN — HYDROmorphone (DILAUDID) tablet 3 mg: 3 mg | ORAL | Stop: 2021-12-26

## 2021-12-14 NOTE — Unmapped (Addendum)
Patient alert and oriented x4, VSS. NGT in L nare at 75cm intact. Patient requested for duoderm for tube securement. Charge RN contacted pediatrics unit and duoderm obtained and applied. Osmolite 1.5 at goal rate 50cc/hr achieved. Patient experiencing ongoing pain and nausea throughout the day. PRN IV dilaudid given with some positive effects. Scheduled IV zofran given with some improvements. Patient complaining of pain when swallowing, refusing some PO medications. Heating pads in use. Patient in bed in no distress.     1800- patient woke up abruptly from nap, stating water at the back of patients throat, coughing, anxious. Mother at bedside. Tube feeds turned off, MD notified and at bedside. IV dilaudid given, emotional support provided.     Problem: Adult Inpatient Plan of Care  Goal: Plan of Care Review  Outcome: Progressing  Goal: Patient-Specific Goal (Individualized)  Outcome: Progressing  Goal: Absence of Hospital-Acquired Illness or Injury  Outcome: Progressing  Intervention: Identify and Manage Fall Risk  Recent Flowsheet Documentation  Taken 12/14/2021 0724 by Donzetta Starch, RN  Safety Interventions:   aspiration precautions   bed alarm   enteral feeding safety   family at bedside   nonskid shoes/slippers when out of bed  Intervention: Prevent Skin Injury  Recent Flowsheet Documentation  Taken 12/14/2021 0724 by Donzetta Starch, RN  Skin Protection:   adhesive use limited   incontinence pads utilized  Intervention: Prevent and Manage VTE (Venous Thromboembolism) Risk  Recent Flowsheet Documentation  Taken 12/14/2021 0724 by Donzetta Starch, RN  Activity Management:   activity adjusted per tolerance   activity encouraged  Intervention: Prevent Infection  Recent Flowsheet Documentation  Taken 12/14/2021 0724 by Donzetta Starch, RN  Infection Prevention: hand hygiene promoted  Goal: Optimal Comfort and Wellbeing  Outcome: Progressing  Goal: Readiness for Transition of Care  Outcome: Progressing  Goal: Rounds/Family Conference  Outcome: Progressing     Problem: Pain Acute  Goal: Acceptable Pain Control and Functional Ability  Outcome: Progressing     Problem: Latex Allergy  Goal: Absence of Allergy Symptoms  Outcome: Progressing     Problem: Oral Intake Inadequate  Goal: Improved Oral Intake  Outcome: Progressing     Problem: Infection  Goal: Absence of Infection Signs and Symptoms  Outcome: Progressing  Intervention: Prevent or Manage Infection  Recent Flowsheet Documentation  Taken 12/14/2021 0724 by Donzetta Starch, RN  Infection Management: aseptic technique maintained     Problem: Fall Injury Risk  Goal: Absence of Fall and Fall-Related Injury  Outcome: Progressing  Intervention: Promote Injury-Free Environment  Recent Flowsheet Documentation  Taken 12/14/2021 0724 by Donzetta Starch, RN  Safety Interventions:   aspiration precautions   bed alarm   enteral feeding safety   family at bedside   nonskid shoes/slippers when out of bed     Problem: Skin Injury Risk Increased  Goal: Skin Health and Integrity  Outcome: Progressing  Intervention: Optimize Skin Protection  Recent Flowsheet Documentation  Taken 12/14/2021 1200 by Donzetta Starch, RN  Head of Bed [facility]) Positioning: HOB at 45 degrees  Taken 12/14/2021 0830 by Donzetta Starch, RN  Head of Bed [facility]) Positioning: HOB at 30-45 degrees  Taken 12/14/2021 0724 by Donzetta Starch, RN  Pressure Reduction Techniques: frequent weight shift encouraged  Head of Bed (HOB) Positioning: HOB at 30 degrees  Pressure Reduction Devices: pressure-redistributing mattress utilized  Skin Protection:   adhesive use limited   incontinence pads utilized

## 2021-12-14 NOTE — Unmapped (Cosign Needed)
[facility]  Follow-Up Psychiatry Consult Note     Service Date: December 13, 2021  LOS:  LOS: 7 days      Assessment:   [name] [name] is a 21 y.o. 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  Patient was seen in consultation by Psychiatry at the request of Georgiann Mccoy, MD with [facility]) for evaluation of Anxiety.     Patient presents with diagnosis/diagnoses of generalized anxiety disorder based on the presence of multiple worries that are difficult to control, sleep disturbance, fatigue/low energy, and impaired concentration/mind going blank. That said, fatigue and sleep disturbance may also be attributable to physical symptoms, and adjustment disorder with anxiety is also a diagnostic consideration.   Of note, she does experience panic attacks but has not been found to meet full criteria for the disorder, though assessment is ongoing.     Updates 12/13/2021:  Since initiation of low-dose olanzapine she continues to report feeling calmer and denies adverse effects.  Overall her anxiety is much improved.  We do not recommend further medication changes today.      Please see below for detailed recommendations (no change from prior).    Diagnoses:   Active Hospital problems:  Principal Problem:    Intractable abdominal pain  Active Problems:    [name]    Intestinal polyps    Dehydration    Physical deconditioning    Severe protein-calorie malnutrition (CMS-HCC)    Winged scapula of right side    Lower abdominal pain    Generalized anxiety disorder with panic attacks       Problems edited/added by me:  No problems updated.      Safety Risk Assessment:  There was a psychiatric evaluation with full risk assessment performed on 12/09/21. At this time there is no change in risk, patient remains at low risk of suicide/ harm to self and at low risk of harm to others.       Recommendations:   ## Safety and Observation Level:   -- Based on the BSA or psychiatric evaluation, we estimate the patient to be at low risk for suicide in the current setting. We recommend routine level of observation on medical unit. This decision is based on my review of the chart including patient's history and current presentation, interview of the patient, mental status examination, and consideration of suicide risk including evaluating suicidal ideation, plan, intent, suicidal or self-harm behaviors, risk factors, and protective factors. This judgment is based on our ability to directly address suicide risk, implement suicide prevention strategies and develop a safety plan while the patient is in the clinical setting.   -- If the patient attempts to leave against medical advice and it is felt to be unsafe for them to leave, please call a Behavioral Response and page Psychiatry at [phone].  -- Please contact our team if there is a concern that risk level has changed.    ## Medications:   -- Continue olanzapine 2.5mg  for anxiety/sleep/appetite  -- Continue mirtazapine 7.5mg  for now; eventually would prefer to transition to an SSRI for better anxiolytic benefit  -- Continue hydroxyzine 25mg  PRN q6 for acute anxiety and add indication for insomnia    ## Medical Decision Making Capacity:   -- A formal capacity  assessement was not performed as a part of this evaluation.  If specific capacity questions arise, please contact our team as below.     ## Further Work-up:   -- No recommendations at this time.    ## Disposition:   -- When patient is discharged, please ensure that their AVS includes information about the 21 Suicide & Crisis Lifeline.  -- The patient will follow-up with Dr. Kathrine Haddock in the [facility] psych-onc clinic for mental health care at the time of discharge.    ## Behavioral / Environmental:   -- As much as possible, allow the patient to choose between two directives to promote sense of autonomy  -- Empathize with feelings and validate concerns    Thank you for this consult request. Recommendations have been communicated to the primary team.  We will follow as needed at this time. Please page [phone] for any questions or concerns.     Discussed with  Attending, [name], MD, who agrees with the assessment and plan.    [name], MD      Interval History:   Relevant events since last seen by psychiatry:   - NG tube placed today  - Sorafenib started  - Ongoing minimal PO intake    Patient Interview:  [name] [name] is seen at bedside along with her mother.  She consents to having her mother remain with her during the interview.    She and her mother both note considerable improvement in her anxiety over the last several days.  [name] [name] notes that the tightness she would experience in her chest when anxious is no longer present.  She was able to handle a difficult situation with a care team member without becoming overwhelming.  She does not note any improvement or change in appetite.      ROS:   All systems reviewed as negative/unremarkable aside from the following pertinent positives and negatives: as per patient interview    Collateral:   - Reviewed medical records in Epic  - Reviewed medical records via CareEverywhere  - Spoke to patient's mother, as mentioned above.  She agrees that [name] [name] has been significantly calmer the last two days    Relevant Updates to past psychiatric, medical/surgical, family, or social history: none    Current Medications:  Scheduled Meds:   acetaminophen  1,000 mg Oral TID    diclofenac sodium  2 g Topical QID    famotidine  20 mg Oral Nightly    gabapentin  300 mg Oral Nightly    levoFLOXacin  500 mg Oral Daily    lidocaine  1 patch Transdermal Q24H    mirtazapine  7.5 mg Oral Nightly    OLANZapine  2.5 mg Oral Nightly ondansetron  4 mg Intravenous Q6H    SORAfenib  200 mg Oral Daily    thiamine mononitrate (vit B1)  100 mg Oral Daily     Continuous Infusions:   IP okay to treat      lactated Ringers 10 mL/hr (12/10/21 2034)    sodium chloride       PRN Meds:.baclofen, calcium carbonate, dexAMETHasone, diphenhydrAMINE, diphenhydrAMINE, EPINEPHrine IM, hydrocortisone, HYDROmorphone, HYDROmorphone, hydrOXYzine, hyoscyamine, IP okay to treat, ketorolac, methylPREDNISolone sodium succinate, phenoL, saliva stimulant comb. no.3, sodium chloride, sodium chloride 0.9%     Objective:   Vital signs:   Temp:  [36.3 ??C (97.3 ??F)-36.8 ??C (98.2 ??F)] 36.8 ??C (98.2 ??F)  Heart Rate:  [80-84] 84  Resp:  [15-18] 15  BP: (115-147)/(71-72) 116/71  MAP (mmHg):  [83-85] 85  SpO2:  [96 %-100 %] 100 %    Physical Exam:  Gen: Mild acute distress initially observed, this lessens during the interview  Pulm: Normal work of breathing.  Neuro/MSK: moving all four limbs spontaneously.  Skin: grossly normal; no erythema.    Mental Status Exam:  Appearance:   Thin young woman, neatly groomed, wearing leopard-patterned nightgown/dress, NG tube in place, sitting in bed    Attitude:   calm and polite   Behavior/Psychomotor:  appropriate eye contact and no abnormal movements   Speech/Language:    Soft volume, clear and coherent.  Normal amount and prosody.    Mood:  ???Better   Affect:   Mildly dysthymic   Thought process:  logical, linear, clear, coherent, goal directed   Thought content:    denies thoughts of self-harm. Denies SI, plans, or intent. Denies HI.  No grandiose, self-referential, persecutory, or paranoid delusions noted.   Perceptual disturbances:   behavior not concerning for response to internal stimuli   Attention:  able to fully attend without fluctuations in consciousness   Concentration:  Able to fully concentrate and attend   Orientation:  grossly oriented.   Memory:  not formally tested, but grossly intact   Fund of knowledge:   not formally assessed   Insight:    Fair   Judgment:    Good   Impulse Control:  Intact       Data Reviewed:  I reviewed labs from the last 24 hours.  I reviewed imaging reports from the last 24 hours.     Additional Psychometric Testing:  Not applicable.    Consult Type and Time-Based Documentation:  This patient was evaluated in person.    Time-based billing disclaimer:  I personally spent 25   minutes face-to-face and non-face-to-face in the care of this patient, which includes all pre, intra, and post visit time on the date of service.  All documented time was specific to the E/M visit and does not include any procedures that may have been performed.

## 2021-12-14 NOTE — Unmapped (Signed)
Daily Progress Note    Assessment/Plan:    Principal Problem:    Intractable abdominal pain  Active Problems:    [name]    Intestinal polyps    Dehydration    Physical deconditioning    Severe protein-calorie malnutrition (CMS-HCC)    Winged scapula of right side    Lower abdominal pain    Generalized anxiety disorder with panic attacks  Resolved Problems:    * No resolved hospital problems. *   Malnutrition Evaluation as performed by RD, LDN: Severe Protein-Calorie Malnutrition in the context of chronic illness (12/11/21 1143)             [name] [name] is a 21 y.o. female who presented to [facility] with Intractable abdominal pain.    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    - Appreciate GI / palliative care assessment  - Continue PO/IV dilaudid with preference to oral dilaudid when possible  - Continue gabapentin 300 at bedtime  - Tylenol 1000 TID  - Lidocaine Patch  - Heating pad   - Bowel regimen  - Continue Sorafenib   - Start hyocyamine  - Hold reglan/miralax       Anxiety   - Continue zyprexa 2.5qhs  - Atarax 25 q6prn  - Rremeron 7.5 at bedtime    Severe Protein-Calorie Malnutrition in the context of chronic illness (12/11/21 1143)  Energy Intake: < or equal to 75% of estimated energy requirement for > or equal to 1 month  Interpretation of Wt. Loss: > 7.5% x 3 month  Malnutrition Score: 2  Tolerated corpak placement well on 10/6, tolerating tube feeds     Wt Readings from Last 10 Encounters:   12/09/21 49.9 kg (110 lb)   12/03/21 51.6 kg (113 lb 12.8 oz)   10/23/21 52.3 kg (115 lb 6.4 oz)   10/02/21 53.1 kg (117 lb)   10/02/21 53.3 kg (117 lb 9.6 oz)   08/27/21 53.1 kg (117 lb)   08/01/21 57.2 kg (126 lb)   07/16/21 56.7 kg (125 lb)   06/28/21 56.9 kg (125 lb 6.4 oz)   06/21/21 56 kg (123 lb 8 oz)        I personally spent more than 50 minutes in chart review, discussing and coordinating plan of care with consults / nursing / patient and/or family.      ___________________________________________________________________    Subjective:    No acute events overnight. Tolerating tube feeds.  Had some worsened pain yesterday in setting of missing some PO doses while NPO for tube placement, but doing better today.      No results found for this or any previous visit (from the past 24 hour(s)).    Labs/Studies:  Labs and Studies from the last 24hrs per EMR and Reviewed    Objective:  Temp:  [36.4 ??C (97.5 ??F)-36.7 ??C (98.1 ??F)] 36.7 ??C (98.1 ??F)  Heart Rate:  [74-84] 74  Resp:  [16] 16  BP: (120)/(77-83) 120/77  SpO2:  [97 %] 97 %    General: No acute distress.  Appears stated age.    HEENT: MMM, oropharynx clear.  NGT in place.    Neck: Supple, no visible JVD  Cardiac: RRR, no M/R/G  Pulmonary: Normal work of breathing, no wheezes or crackles  Abdomen: No abdominal distention, pain, rebound or guarding.    Skin: No jaundice. No rashes or lesions.  Extremities: No  edema, cyanosis, or clubbing. Warm.  Moderate to severe evidence of fat / muscle wasting.   Neuro: CN 2-12 intact, no gross motor deficits  Psych: Oriented to self, place, and time. Normal speech and affect.

## 2021-12-14 NOTE — Unmapped (Signed)
Problem: Pain Acute  Goal: Acceptable Pain Control and Functional Ability  Outcome: Not Progressing     Problem: Adult Inpatient Plan of Care  Goal: Plan of Care Review  Outcome: Ongoing - Unchanged  Goal: Patient-Specific Goal (Individualized)  Outcome: Ongoing - Unchanged  Goal: Absence of Hospital-Acquired Illness or Injury  Outcome: Ongoing - Unchanged  Intervention: Identify and Manage Fall Risk  Recent Flowsheet Documentation  Taken 12/13/2021 2223 by Judithe Modest, RN  Safety Interventions:   environmental modification   fall reduction program maintained   lighting adjusted for tasks/safety   low bed   nonskid shoes/slippers when out of bed  Intervention: Prevent Skin Injury  Recent Flowsheet Documentation  Taken 12/13/2021 2233 by Judithe Modest, RN  Skin Protection: adhesive use limited  Taken 12/13/2021 2223 by Judithe Modest, RN  Skin Protection: adhesive use limited  Intervention: Prevent and Manage VTE (Venous Thromboembolism) Risk  Recent Flowsheet Documentation  Taken 12/13/2021 2233 by Judithe Modest, RN  VTE Prevention/Management: ambulation promoted  Taken 12/13/2021 2223 by Judithe Modest, RN  Activity Management:   activity encouraged   activity adjusted per tolerance   ambulated in room   ambulated to bathroom   ambulated outside room  Range of Motion: Bilateral Upper and Lower Extremities  Intervention: Prevent Infection  Recent Flowsheet Documentation  Taken 12/13/2021 2223 by Judithe Modest, RN  Infection Prevention: hand hygiene promoted  Goal: Optimal Comfort and Wellbeing  Outcome: Ongoing - Unchanged  Goal: Readiness for Transition of Care  Outcome: Ongoing - Unchanged  Goal: Rounds/Family Conference  Outcome: Ongoing - Unchanged     Problem: Latex Allergy  Goal: Absence of Allergy Symptoms  Outcome: Ongoing - Unchanged     Problem: Oral Intake Inadequate  Goal: Improved Oral Intake  Outcome: Ongoing - Unchanged     Problem: Infection  Goal: Absence of Infection Signs and Symptoms  Outcome: Ongoing - Unchanged  Intervention: Prevent or Manage Infection  Recent Flowsheet Documentation  Taken 12/13/2021 2223 by Judithe Modest, RN  Infection Management: aseptic technique maintained     Problem: Fall Injury Risk  Goal: Absence of Fall and Fall-Related Injury  Outcome: Progressing  Intervention: Identify and Manage Contributors  Recent Flowsheet Documentation  Taken 12/13/2021 2233 by Judithe Modest, RN  Self-Care Promotion: independence encouraged  Intervention: Promote Injury-Free Environment  Recent Flowsheet Documentation  Taken 12/13/2021 2223 by Judithe Modest, RN  Safety Interventions:   environmental modification   fall reduction program maintained   lighting adjusted for tasks/safety   low bed   nonskid shoes/slippers when out of bed

## 2021-12-15 MED ADMIN — SORAfenib (NexAVAR) tablet 200 mg **PATIENT SUPPLIED**: 200 mg | ORAL | @ 14:00:00

## 2021-12-15 MED ADMIN — ondansetron (ZOFRAN) injection 4 mg: 4 mg | INTRAVENOUS | @ 17:00:00

## 2021-12-15 MED ADMIN — lidocaine (LIDODERM) 5 % patch 1 patch: 1 | TRANSDERMAL | @ 01:00:00

## 2021-12-15 MED ADMIN — HYDROmorphone (PF) (DILAUDID) injection 1 mg: 1 mg | INTRAVENOUS | @ 22:00:00 | Stop: 2021-12-19

## 2021-12-15 MED ADMIN — famotidine (PEPCID) tablet 20 mg: 20 mg | ORAL | @ 01:00:00

## 2021-12-15 MED ADMIN — HYDROmorphone (PF) (DILAUDID) injection 1 mg: 1 mg | INTRAVENOUS | @ 04:00:00 | Stop: 2021-12-19

## 2021-12-15 MED ADMIN — ondansetron (ZOFRAN) injection 4 mg: 4 mg | INTRAVENOUS | @ 10:00:00

## 2021-12-15 MED ADMIN — diphenhydrAMINE (BENADRYL) injection 25 mg: 25 mg | INTRAVENOUS | @ 21:00:00

## 2021-12-15 MED ADMIN — HYDROmorphone (DILAUDID) tablet 3 mg: 3 mg | ORAL | @ 17:00:00 | Stop: 2021-12-26

## 2021-12-15 MED ADMIN — gabapentin (NEURONTIN) capsule 300 mg: 300 mg | ORAL | @ 01:00:00

## 2021-12-15 MED ADMIN — HYDROmorphone (PF) (DILAUDID) injection 1 mg: 1 mg | INTRAVENOUS | @ 10:00:00 | Stop: 2021-12-19

## 2021-12-15 MED ADMIN — mirtazapine (REMERON) tablet 7.5 mg: 7.5 mg | ORAL | @ 01:00:00

## 2021-12-15 MED ADMIN — OLANZapine (ZyPREXA) tablet 2.5 mg: 2.5 mg | ORAL | @ 01:00:00

## 2021-12-15 MED ADMIN — HYDROmorphone (PF) (DILAUDID) injection 1 mg: 1 mg | INTRAVENOUS | @ 01:00:00 | Stop: 2021-12-19

## 2021-12-15 MED ADMIN — ondansetron (ZOFRAN) injection 4 mg: 4 mg | INTRAVENOUS | @ 01:00:00

## 2021-12-15 MED ADMIN — levoFLOXacin (LEVAQUIN) tablet 500 mg: 500 mg | ORAL | @ 13:00:00 | Stop: 2021-12-17

## 2021-12-15 MED ADMIN — HYDROmorphone (PF) (DILAUDID) injection 1 mg: 1 mg | INTRAVENOUS | @ 13:00:00 | Stop: 2021-12-19

## 2021-12-15 MED ADMIN — thiamine mononitrate (vit B1) tablet 100 mg: 100 mg | ORAL | @ 13:00:00 | Stop: 2021-12-17

## 2021-12-15 MED ADMIN — HYDROmorphone (PF) (DILAUDID) injection 1 mg: 1 mg | INTRAVENOUS | @ 19:00:00 | Stop: 2021-12-19

## 2021-12-15 NOTE — Unmapped (Signed)
Problem: Pain Acute  Goal: Acceptable Pain Control and Functional Ability  Outcome: Not Progressing     Problem: Adult Inpatient Plan of Care  Goal: Plan of Care Review  Outcome: Ongoing - Unchanged  Goal: Patient-Specific Goal (Individualized)  Outcome: Ongoing - Unchanged  Goal: Absence of Hospital-Acquired Illness or Injury  Outcome: Ongoing - Unchanged  Intervention: Identify and Manage Fall Risk  Recent Flowsheet Documentation  Taken 12/15/2021 1400 by Russ Halo, RN  Safety Interventions:   fall reduction program maintained   lighting adjusted for tasks/safety   low bed   family at bedside  Taken 12/15/2021 1200 by Russ Halo, RN  Safety Interventions:   fall reduction program maintained   family at bedside   lighting adjusted for tasks/safety  Taken 12/15/2021 1000 by Russ Halo, RN  Safety Interventions:   fall reduction program maintained   family at bedside   lighting adjusted for tasks/safety   low bed  Taken 12/15/2021 0800 by Russ Halo, RN  Safety Interventions:   family at bedside   fall reduction program maintained   low bed  Intervention: Prevent and Manage VTE (Venous Thromboembolism) Risk  Recent Flowsheet Documentation  Taken 12/15/2021 0900 by Russ Halo, RN  VTE Prevention/Management:   ambulation promoted   fluids promoted  Intervention: Prevent Infection  Recent Flowsheet Documentation  Taken 12/15/2021 0800 by Russ Halo, RN  Infection Prevention:   personal protective equipment utilized   rest/sleep promoted   hand hygiene promoted  Goal: Optimal Comfort and Wellbeing  Outcome: Ongoing - Unchanged  Goal: Readiness for Transition of Care  Outcome: Ongoing - Unchanged  Goal: Rounds/Family Conference  Outcome: Ongoing - Unchanged     Problem: Latex Allergy  Goal: Absence of Allergy Symptoms  Outcome: Ongoing - Unchanged     Problem: Oral Intake Inadequate  Goal: Improved Oral Intake  Outcome: Ongoing - Unchanged     Problem: Infection  Goal: Absence of Infection Signs and Symptoms  Outcome: Ongoing - Unchanged     Problem: Fall Injury Risk  Goal: Absence of Fall and Fall-Related Injury  Outcome: Ongoing - Unchanged  Intervention: Promote Injury-Free Environment  Recent Flowsheet Documentation  Taken 12/15/2021 1400 by Russ Halo, RN  Safety Interventions:   fall reduction program maintained   lighting adjusted for tasks/safety   low bed   family at bedside  Taken 12/15/2021 1200 by Russ Halo, RN  Safety Interventions:   fall reduction program maintained   family at bedside   lighting adjusted for tasks/safety  Taken 12/15/2021 1000 by Russ Halo, RN  Safety Interventions:   fall reduction program maintained   family at bedside   lighting adjusted for tasks/safety   low bed  Taken 12/15/2021 0800 by Russ Halo, RN  Safety Interventions:   family at bedside   fall reduction program maintained   low bed     Problem: Skin Injury Risk Increased  Goal: Skin Health and Integrity  Outcome: Ongoing - Unchanged  Intervention: Optimize Skin Protection  Recent Flowsheet Documentation  Taken 12/15/2021 1400 by Russ Halo, RN  Pressure Reduction Techniques: frequent weight shift encouraged  Pressure Reduction Devices: pressure-redistributing mattress utilized  Taken 12/15/2021 1200 by Russ Halo, RN  Pressure Reduction Techniques: frequent weight shift encouraged  Pressure Reduction Devices: pressure-redistributing mattress utilized  Taken 12/15/2021 1000 by Russ Halo, RN  Pressure Reduction Techniques: frequent weight shift encouraged  Pressure Reduction Devices: pressure-redistributing mattress utilized  Taken 12/15/2021 0800 by Russ Halo, RN  Pressure Reduction Techniques: frequent  weight shift encouraged  Pressure Reduction Devices: pressure-redistributing mattress utilized

## 2021-12-15 NOTE — Unmapped (Signed)
Problem: Pain Acute  Goal: Acceptable Pain Control and Functional Ability  Outcome: Not Progressing     Problem: Adult Inpatient Plan of Care  Goal: Plan of Care Review  Outcome: Ongoing - Unchanged  Goal: Patient-Specific Goal (Individualized)  Outcome: Ongoing - Unchanged  Goal: Absence of Hospital-Acquired Illness or Injury  Outcome: Ongoing - Unchanged  Intervention: Identify and Manage Fall Risk  Recent Flowsheet Documentation  Taken 12/15/2021 0400 by Judithe Modest, RN  Safety Interventions:   chemotherapeutic agent precautions   environmental modification   commode/urinal/bedpan at bedside   fall reduction program maintained   lighting adjusted for tasks/safety   low bed   nonskid shoes/slippers when out of bed  Taken 12/15/2021 0200 by Judithe Modest, RN  Safety Interventions:   chemotherapeutic agent precautions   commode/urinal/bedpan at bedside   environmental modification   fall reduction program maintained   family at bedside   lighting adjusted for tasks/safety   low bed   nonskid shoes/slippers when out of bed  Taken 12/15/2021 0000 by Judithe Modest, RN  Safety Interventions:   chemotherapeutic agent precautions   commode/urinal/bedpan at bedside   environmental modification   fall reduction program maintained   lighting adjusted for tasks/safety   family at bedside   low bed   nonskid shoes/slippers when out of bed  Taken 12/14/2021 2200 by Judithe Modest, RN  Safety Interventions:   chemotherapeutic agent precautions   commode/urinal/bedpan at bedside   environmental modification   fall reduction program maintained   family at bedside   lighting adjusted for tasks/safety   low bed   nonskid shoes/slippers when out of bed  Taken 12/14/2021 2011 by Judithe Modest, RN  Safety Interventions:   aspiration precautions   chemotherapeutic agent precautions   commode/urinal/bedpan at bedside   fall reduction program maintained   environmental modification   lighting adjusted for tasks/safety   low bed   family at bedside   nonskid shoes/slippers when out of bed  Intervention: Prevent Skin Injury  Recent Flowsheet Documentation  Taken 12/14/2021 2117 by Judithe Modest, RN  Skin Protection:   adhesive use limited   incontinence pads utilized  Taken 12/14/2021 2011 by Judithe Modest, RN  Skin Protection: adhesive use limited  Intervention: Prevent and Manage VTE (Venous Thromboembolism) Risk  Recent Flowsheet Documentation  Taken 12/14/2021 2117 by Judithe Modest, RN  VTE Prevention/Management: ambulation promoted  Taken 12/14/2021 2011 by Judithe Modest, RN  Range of Motion: Bilateral Upper and Lower Extremities  Intervention: Prevent Infection  Recent Flowsheet Documentation  Taken 12/14/2021 2011 by Judithe Modest, RN  Infection Prevention: rest/sleep promoted  Goal: Optimal Comfort and Wellbeing  Outcome: Ongoing - Unchanged  Goal: Readiness for Transition of Care  Outcome: Ongoing - Unchanged  Goal: Rounds/Family Conference  Outcome: Ongoing - Unchanged     Problem: Latex Allergy  Goal: Absence of Allergy Symptoms  Outcome: Ongoing - Unchanged     Problem: Oral Intake Inadequate  Goal: Improved Oral Intake  Outcome: Ongoing - Unchanged     Problem: Skin Injury Risk Increased  Goal: Skin Health and Integrity  Outcome: Ongoing - Unchanged  Intervention: Optimize Skin Protection  Recent Flowsheet Documentation  Taken 12/15/2021 0400 by Judithe Modest, RN  Pressure Reduction Techniques: frequent weight shift encouraged  Pressure Reduction Devices: pressure-redistributing mattress utilized  Taken 12/15/2021 0200 by Judithe Modest, RN  Pressure Reduction Techniques: frequent weight shift encouraged  Pressure Reduction Devices: pressure-redistributing  mattress utilized  Taken 12/15/2021 0000 by Judithe Modest, RN  Pressure Reduction Techniques: frequent weight shift encouraged  Pressure Reduction Devices: pressure-redistributing mattress utilized  Taken 12/14/2021 2200 by Judithe Modest, RN  Pressure Reduction Techniques: frequent weight shift encouraged  Pressure Reduction Devices: pressure-redistributing mattress utilized  Taken 12/14/2021 2117 by Judithe Modest, RN  Pressure Reduction Techniques: frequent weight shift encouraged  Pressure Reduction Devices: pressure-redistributing mattress utilized  Skin Protection:   adhesive use limited   incontinence pads utilized  Taken 12/14/2021 2011 by Judithe Modest, RN  Pressure Reduction Techniques: frequent weight shift encouraged  Head of Bed (HOB) Positioning: HOB at 60 degrees  Pressure Reduction Devices: pressure-redistributing mattress utilized  Skin Protection: adhesive use limited     Problem: Infection  Goal: Absence of Infection Signs and Symptoms  Outcome: Progressing  Intervention: Prevent or Manage Infection  Recent Flowsheet Documentation  Taken 12/14/2021 2011 by Judithe Modest, RN  Infection Management: aseptic technique maintained     Problem: Fall Injury Risk  Goal: Absence of Fall and Fall-Related Injury  Outcome: Progressing  Intervention: Identify and Manage Contributors  Recent Flowsheet Documentation  Taken 12/14/2021 2117 by Judithe Modest, RN  Self-Care Promotion: independence encouraged  Intervention: Promote Injury-Free Environment  Recent Flowsheet Documentation  Taken 12/15/2021 0400 by Judithe Modest, RN  Safety Interventions:   chemotherapeutic agent precautions   environmental modification   commode/urinal/bedpan at bedside   fall reduction program maintained   lighting adjusted for tasks/safety   low bed   nonskid shoes/slippers when out of bed  Taken 12/15/2021 0200 by Judithe Modest, RN  Safety Interventions:   chemotherapeutic agent precautions   commode/urinal/bedpan at bedside   environmental modification   fall reduction program maintained   family at bedside   lighting adjusted for tasks/safety   low bed   nonskid shoes/slippers when out of bed  Taken 12/15/2021 0000 by Judithe Modest, RN  Safety Interventions:   chemotherapeutic agent precautions   commode/urinal/bedpan at bedside   environmental modification   fall reduction program maintained   lighting adjusted for tasks/safety   family at bedside   low bed   nonskid shoes/slippers when out of bed  Taken 12/14/2021 2200 by Judithe Modest, RN  Safety Interventions:   chemotherapeutic agent precautions   commode/urinal/bedpan at bedside   environmental modification   fall reduction program maintained   family at bedside   lighting adjusted for tasks/safety   low bed   nonskid shoes/slippers when out of bed  Taken 12/14/2021 2011 by Judithe Modest, RN  Safety Interventions:   aspiration precautions   chemotherapeutic agent precautions   commode/urinal/bedpan at bedside   fall reduction program maintained   environmental modification   lighting adjusted for tasks/safety   low bed   family at bedside   nonskid shoes/slippers when out of bed

## 2021-12-15 NOTE — Unmapped (Signed)
Daily Progress Note    Assessment/Plan:    Principal Problem:    Intractable abdominal pain  Active Problems:    [name]    Intestinal polyps    Dehydration    Physical deconditioning    Severe protein-calorie malnutrition (CMS-HCC)    Winged scapula of right side    Lower abdominal pain    Generalized anxiety disorder with panic attacks  Resolved Problems:    * No resolved hospital problems. *   Malnutrition Evaluation as performed by RD, LDN: Severe Protein-Calorie Malnutrition in the context of chronic illness (12/11/21 1143)             [name] [name] is a 21 y.o. female who presented to [facility] with Intractable abdominal pain.    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    - Appreciate GI / palliative care assessment  - Continue PO/IV dilaudid with preference to oral dilaudid when possible  - Continue gabapentin 300 at bedtime  - Tylenol 1000 TID  - Lidocaine Patch  - Heating pad   - Bowel regimen  - Continue Sorafenib   - Start hyocyamine  - Hold reglan/miralax       Anxiety   - Continue zyprexa 2.5qhs  - Atarax 25 q6prn  - Remeron 7.5 at bedtime    Severe Protein-Calorie Malnutrition in the context of chronic illness (12/11/21 1143)  Energy Intake: < or equal to 75% of estimated energy requirement for > or equal to 1 month  Interpretation of Wt. Loss: > 7.5% x 3 month  Malnutrition Score: 2  Resume tube feeds slowly, goal 30 today.  Will reduce FWF / patient can drink to thirst.      Wt Readings from Last 10 Encounters:   12/09/21 49.9 kg (110 lb)   12/03/21 51.6 kg (113 lb 12.8 oz)   10/23/21 52.3 kg (115 lb 6.4 oz)   10/02/21 53.1 kg (117 lb)   10/02/21 53.3 kg (117 lb 9.6 oz)   08/27/21 53.1 kg (117 lb)   08/01/21 57.2 kg (126 lb)   07/16/21 56.7 kg (125 lb)   06/28/21 56.9 kg (125 lb 6.4 oz)   06/21/21 56 kg (123 lb 8 oz)        I personally spent more than 50 minutes in chart review, discussing and coordinating plan of care with consults / nursing / patient and/or family.      ___________________________________________________________________    Subjective:    Patient reports an episode spitting up tube feeds / fluid last night when the rate was advanced to 50 - feels like this may have been too much for her.  Had a lot of pain / anxiety around this. Denies shortness of breath / dyspnea this morning.       No results found for this or any previous visit (from the past 24 hour(s)).    Labs/Studies:  Labs and Studies from the last 24hrs per EMR and Reviewed    Objective:  Temp:  [36.6 ??C (97.9 ??F)-36.8 ??C (98.2 ??F)] 36.6 ??C (97.9 ??F)  Heart Rate:  [65-85] 85  Resp:  [14-16] 16  BP: (110-120)/(72-80) 110/74  SpO2:  [97 %-98 %] 97 %    General: No acute distress.  Appears stated age.    HEENT: MMM, oropharynx clear.  NGT in place.    Neck: Supple, no visible JVD  Cardiac: RRR, no M/R/G  Pulmonary:  Normal work of breathing, no wheezes or crackles  Abdomen: No abdominal distention, pain, rebound or guarding.    Skin: No jaundice. No rashes or lesions.  Extremities: No edema, cyanosis, or clubbing. Warm.  Moderate to severe evidence of fat / muscle wasting.   Neuro: CN 2-12 intact, no gross motor deficits  Psych: Oriented to self, place, and time. Normal speech and affect.

## 2021-12-16 MED ADMIN — ondansetron (ZOFRAN) injection 4 mg: 4 mg | INTRAVENOUS | @ 07:00:00

## 2021-12-16 MED ADMIN — acetaminophen (TYLENOL) tablet 1,000 mg: 1000 mg | ORAL | @ 01:00:00

## 2021-12-16 MED ADMIN — OLANZapine (ZyPREXA) tablet 2.5 mg: 2.5 mg | ORAL | @ 01:00:00

## 2021-12-16 MED ADMIN — HYDROmorphone (DILAUDID) tablet 3 mg: 3 mg | ORAL | @ 13:00:00 | Stop: 2021-12-26

## 2021-12-16 MED ADMIN — ondansetron (ZOFRAN) injection 4 mg: 4 mg | INTRAVENOUS | @ 13:00:00

## 2021-12-16 MED ADMIN — HYDROmorphone (DILAUDID) tablet 3 mg: 3 mg | ORAL | @ 07:00:00 | Stop: 2021-12-26

## 2021-12-16 MED ADMIN — HYDROmorphone (PF) (DILAUDID) injection 1 mg: 1 mg | INTRAVENOUS | @ 20:00:00 | Stop: 2021-12-30

## 2021-12-16 MED ADMIN — HYDROmorphone (PF) (DILAUDID) injection 1 mg: 1 mg | INTRAVENOUS | @ 15:00:00 | Stop: 2021-12-30

## 2021-12-16 MED ADMIN — gabapentin (NEURONTIN) capsule 300 mg: 300 mg | ORAL | @ 01:00:00

## 2021-12-16 MED ADMIN — lidocaine (LIDODERM) 5 % patch 1 patch: 1 | TRANSDERMAL | @ 23:00:00

## 2021-12-16 MED ADMIN — acetaminophen (TYLENOL) tablet 1,000 mg: 1000 mg | ORAL | @ 13:00:00

## 2021-12-16 MED ADMIN — mirtazapine (REMERON) tablet 7.5 mg: 7.5 mg | ORAL | @ 01:00:00

## 2021-12-16 MED ADMIN — HYDROmorphone (DILAUDID) tablet 3 mg: 3 mg | ORAL | @ 01:00:00 | Stop: 2021-12-26

## 2021-12-16 MED ADMIN — HYDROmorphone (PF) (DILAUDID) injection 1 mg: 1 mg | INTRAVENOUS | @ 03:00:00 | Stop: 2021-12-19

## 2021-12-16 MED ADMIN — levoFLOXacin (LEVAQUIN) tablet 500 mg: 500 mg | ORAL | @ 13:00:00 | Stop: 2021-12-16

## 2021-12-16 MED ADMIN — famotidine (PEPCID) tablet 20 mg: 20 mg | ORAL | @ 01:00:00

## 2021-12-16 MED ADMIN — lidocaine (LIDODERM) 5 % patch 1 patch: 1 | TRANSDERMAL | @ 04:00:00

## 2021-12-16 MED ADMIN — SORAfenib (NexAVAR) tablet 200 mg **PATIENT SUPPLIED**: 200 mg | ORAL | @ 14:00:00

## 2021-12-16 MED ADMIN — lidocaine (LIDODERM) 5 % patch 2 patch: 2 | TRANSDERMAL | @ 23:00:00

## 2021-12-16 MED ADMIN — HYDROmorphone (DILAUDID) tablet 3 mg: 3 mg | ORAL | @ 23:00:00 | Stop: 2021-12-30

## 2021-12-16 MED ADMIN — HYDROmorphone (DILAUDID) tablet 3 mg: 3 mg | ORAL | @ 17:00:00 | Stop: 2021-12-26

## 2021-12-16 MED ADMIN — ondansetron (ZOFRAN) injection 4 mg: 4 mg | INTRAVENOUS | @ 01:00:00

## 2021-12-16 MED ADMIN — ondansetron (ZOFRAN) injection 4 mg: 4 mg | INTRAVENOUS | @ 20:00:00

## 2021-12-16 MED ADMIN — thiamine mononitrate (vit B1) tablet 100 mg: 100 mg | ORAL | @ 13:00:00 | Stop: 2021-12-16

## 2021-12-16 NOTE — Unmapped (Signed)
Oncology Progress Note    Requesting Attending Physician :  Georgiann Mccoy, MD  Service Requesting Consult : Med Bernita Raisin [facility])  Reason for Consult: care coordination  Primary Oncologist: Dr. [name]    Assessment: [name] [name] is a 21 y.o. 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 Oncology consulted for care coordination and systemic therapy. NG tube placed 10/6, tolerating tube feeds at 30cc/hr.    Recommendations:   - continue trial of NG tube to meet caloric needs in agreement with nutrition, GI, and patient  - continue sorafenib for systemic therapy given contribution of tumor burden to overall clinical picture  - continue multimodal pain regimen, agree with palliative trial of PO medications to wean IV opioids;     consideration for incorporating long-acting opioids to meet daily pain control needs   - agree with going off unit, eg courtyard visits as tolerated     These recommendations were discussed with the primary team.     Please contact the oncology consult fellow at [phone] with any further questions.    Freddy Finner, MD  Oncology Fellow    -------------------------------------------------------------    HPI: [name] [name] is a 21 y.o. female and who is being seen at the request of Georgiann Mccoy, MD for evaluation of GI symptoms.     She has a history of [name] s/p IPAA who presents for abdominal pain, nausea/emesis/diarrhea and inadequate PO due to these symptoms.    Follows with Dr. [name] as outpatient. Admitted for ongoing symptoms in setting of complex intraabdominal malignancy complicated by likely disorders of gut-brain axis, visceral hypersensitivity, and post-surgical adhesions. She was started on empiric levaquin for SIBO concern based on imaging. She had pouchoscopy performed 12/11/2021 without evidence of stricture; 2 small nonbleeding ulcers near anastomosis. Due to inability to meet daily caloric needs with PO intake, planned for NGT trial.    She has also started systemic therapy with sorafenib given increased desmoid burden on imaging, tolerating thus far.    12/06/2021 CT abdomen:  - Sequelae of prior colectomy with ileoanal anastomosis. No evidence of bowel obstruction.  - Unchanged enteric soft tissue lesions with infiltrative features, favored to reflect desmoids.  - 1.8 cm enhancing nodularity in the lower right rectus musculature, favoring  a rectus desmoid, seen to better advantage on this postcontrast CT examination. Additional nodularity in the upper right rectus may be postsurgical or reflect  small desmoids.    Review of Systems: All positive and pertinent negatives are noted in the HPI; a 10 system review of systems was otherwise negative except as noted in HPI.    Oncologic History:  Oncology History    No history exists.       Past Medical History:   Diagnosis Date    Abdominal pain     Acid reflux     occas    Anesthesia complication     itching, shaking, coldness; last few surgeries have gone much better    Cataract of right eye     COVID-19 virus infection 01/2019    Cyst of thyroid determined by ultrasound     monitoring    [name]     2 right forearm, 1 left thigh, 1 right scapula, 1 under left clavicle; multiple    Difficult intravenous access     [name] (familial adenomatous polyposis)  [name]     Gastric polyps     History of chemotherapy     last treatment approx 05/2019    History of colon polyps     History of COVID-19 01/2019    Iron deficiency anemia due to chronic blood loss     received iron infusion 11-2019    PONV (postoperative nausea and vomiting)     Rectal bleeding     Syncopal episodes     especially if becoming dehydrated       Past Surgical History:   Procedure Laterality Date    cyroablation      cystis removal      desmoid removal      PR CLOSE ENTEROSTOMY,RESEC+ANAST N/A 10/09/2020    Procedure: ILEOSTOMY TAKEDOWN;  Surgeon: Mickle Asper, MD;  Location: OR [place];  Service: General Surgery    PR COLONOSCOPY W/BIOPSY SINGLE/MULTIPLE N/A 10/27/2012    Procedure: COLONOSCOPY, FLEXIBLE, PROXIMAL TO SPLENIC FLEXURE; WITH BIOPSY, SINGLE OR MULTIPLE;  Surgeon: Shirlyn Goltz Mir, MD;  Location: PEDS PROCEDURE ROOM [place];  Service: Gastroenterology    PR COLONOSCOPY W/BIOPSY SINGLE/MULTIPLE N/A 09/14/2013    Procedure: COLONOSCOPY, FLEXIBLE, PROXIMAL TO SPLENIC FLEXURE; WITH BIOPSY, SINGLE OR MULTIPLE;  Surgeon: Shirlyn Goltz Mir, MD;  Location: PEDS PROCEDURE ROOM [place];  Service: Gastroenterology    PR COLONOSCOPY W/BIOPSY SINGLE/MULTIPLE N/A 11/08/2014    Procedure: COLONOSCOPY, FLEXIBLE, PROXIMAL TO SPLENIC FLEXURE; WITH BIOPSY, SINGLE OR MULTIPLE;  Surgeon: Arnold Long Mir, MD;  Location: PEDS PROCEDURE ROOM [facility];  Service: Gastroenterology    PR COLONOSCOPY W/BIOPSY SINGLE/MULTIPLE N/A 12/26/2015    Procedure: COLONOSCOPY, FLEXIBLE, PROXIMAL TO SPLENIC FLEXURE; WITH BIOPSY, SINGLE OR MULTIPLE;  Surgeon: Arnold Long Mir, MD;  Location: PEDS PROCEDURE ROOM [facility];  Service: Gastroenterology    PR COLONOSCOPY W/BIOPSY SINGLE/MULTIPLE N/A 09/02/2017    Procedure: COLONOSCOPY, FLEXIBLE, PROXIMAL TO SPLENIC FLEXURE; WITH BIOPSY, SINGLE OR MULTIPLE;  Surgeon: Arnold Long Mir, MD;  Location: PEDS PROCEDURE ROOM [place];  Service: Gastroenterology    PR COLSC FLX W/REMOVAL LESION BY HOT BX FORCEPS N/A 08/27/2016    Procedure: COLONOSCOPY, FLEXIBLE, PROXIMAL TO SPLENIC FLEXURE; W/REMOVAL TUMOR/POLYP/OTHER LESION, HOT BX FORCEP/CAUTE;  Surgeon: Arnold Long Mir, MD;  Location: PEDS PROCEDURE ROOM [facility];  Service: Gastroenterology    PR COLSC FLX W/RMVL OF TUMOR POLYP LESION SNARE TQ N/A 02/25/2019    Procedure: COLONOSCOPY FLEX; W/REMOV TUMOR/LES BY SNARE;  Surgeon: Helyn Numbers, MD;  Location: GI PROCEDURES [place] [facility];  Service: Gastroenterology    PR COLSC FLX W/RMVL OF TUMOR POLYP LESION SNARE TQ N/A 03/13/2020    Procedure: COLONOSCOPY FLEX; W/REMOV TUMOR/LES BY SNARE;  Surgeon: Helyn Numbers, MD;  Location: GI PROCEDURES [place] [facility];  Service: Gastroenterology    PR EXC SKIN BENIG 2.1-3 CM TRUNK,ARM,LEG Right 02/25/2017    Procedure: EXCISION, BENIGN LESION INCLUDE MARGINS, EXCEPT SKIN TAG, LEGS; EXCISED DIAMETER 2.1 TO 3.0 CM;  Surgeon: Clarene Duke, MD;  Location: CHILDRENS OR [facility];  Service: Plastics    PR EXC SKIN BENIG 3.1-4 CM TRUNK,ARM,LEG Right 02/25/2017    Procedure: EXCISION, BENIGN LESION INCLUDE MARGINS, EXCEPT SKIN TAG, ARMS; EXCISED DIAMETER 3.1 TO 4.0 CM;  Surgeon: Clarene Duke, MD;  Location: CHILDRENS OR [facility];  Service: Plastics    PR EXC SKIN BENIG >4 CM FACE,FACIAL Right 02/25/2017    Procedure: EXCISION, OTHER BENIGN LES INCLUD MARGIN, FACE/EARS/EYELIDS/NOSE/LIPS/MUCOUS MEMBRANE; EXCISED DIAM >4.0 CM;  Surgeon: Clarene Duke, MD;  Location: CHILDRENS OR [facility];  Service: Plastics  PR EXC TUMOR SOFT TISSUE LEG/ANKLE SUBQ 3+CM Right 08/05/2019    Procedure: EXCISION, TUMOR, SOFT TISSUE OF LEG OR ANKLE AREA, SUBCUTANEOUS; 3 CM OR GREATER;  Surgeon: Arsenio Katz, MD;  Location: MAIN OR [place];  Service: Plastics    PR EXC TUMOR SOFT TISSUE LEG/ANKLE SUBQ <3CM Right 08/05/2019    Procedure: EXCISION, TUMOR, SOFT TISSUE OF LEG OR ANKLE AREA, SUBCUTANEOUS; LESS THAN 3 CM;  Surgeon: Arsenio Katz, MD;  Location: MAIN OR [facility];  Service: Plastics    PR LAP, SURG PROCTECTOMY W J-POUCH N/A 08/10/2020    Procedure: ROBOTIC ASSISTED LAPAROSCOPIC PROCTOCOLECTOMY, ILEAL J POUCH, WITH OSTOMY;  Surgeon: Mickle Asper, MD;  Location: OR  [place];  Service: General Surgery    PR NDSC EVAL INTSTINAL POUCH DX W/COLLJ SPEC SPX N/A 01/23/2021    Procedure: ENDO EVAL SM INTEST POUCH; DX;  Surgeon: Modena Nunnery, MD;  Location: GI PROCEDURES [place] [facility];  Service: Gastroenterology    PR NDSC EVAL INTSTINAL POUCH DX W/COLLJ SPEC SPX N/A 08/27/2021    Procedure: ENDO EVAL SM INTEST POUCH; DX;  Surgeon: Hunt Oris, MD;  [name];  Service: Gastroenterology    PR NDSC EVAL INTSTINAL POUCH DX W/COLLJ SPEC SPX N/A 12/09/2021    Procedure: ENDO EVAL SM INTEST POUCH; DX;  Surgeon: Vidal Schwalbe, MD;  [facility]: [facility];  Service: Gastroenterology    PR UNLISTED PROCEDURE SMALL INTESTINE  01/23/2021    Procedure: UNLISTED PROCEDURE, SMALL INTESTINE;  Surgeon: Modena Nunnery, MD;  Location: [facility] PROCEDURES [place] [facility];  Service: Gastroenterology    PR UPPER GI ENDOSCOPY,BIOPSY N/A 10/27/2012    Procedure: UGI ENDOSCOPY; WITH BIOPSY, SINGLE OR MULTIPLE;  Surgeon: Shirlyn Goltz Mir, MD;  Location: PEDS PROCEDURE ROOM [facility];  Service: Gastroenterology    PR UPPER GI ENDOSCOPY,BIOPSY N/A 09/14/2013    Procedure: UGI ENDOSCOPY; WITH BIOPSY, SINGLE OR MULTIPLE;  Surgeon: Shirlyn Goltz Mir, MD;  Location: PEDS PROCEDURE ROOM [facility];  Service: Gastroenterology    PR UPPER GI ENDOSCOPY,BIOPSY N/A 11/08/2014    Procedure: UGI ENDOSCOPY; WITH BIOPSY, SINGLE OR MULTIPLE;  Surgeon: Arnold Long Mir, MD;  Location: PEDS PROCEDURE ROOM [facility];  Service: Gastroenterology    PR UPPER GI ENDOSCOPY,BIOPSY N/A 12/26/2015    Procedure: UGI ENDOSCOPY; WITH BIOPSY, SINGLE OR MULTIPLE;  Surgeon: Arnold Long Mir, MD;  Location: PEDS PROCEDURE ROOM [facility];  Service: Gastroenterology    PR UPPER GI ENDOSCOPY,BIOPSY N/A 08/27/2016    Procedure: UGI ENDOSCOPY; WITH BIOPSY, SINGLE OR MULTIPLE;  Surgeon: Arnold Long Mir, MD;  Location: PEDS PROCEDURE ROOM [facility];  Service: Gastroenterology    PR UPPER GI ENDOSCOPY,BIOPSY N/A 09/02/2017    Procedure: UGI ENDOSCOPY; WITH BIOPSY, SINGLE OR MULTIPLE;  Surgeon: Arnold Long Mir, MD;  Location: PEDS PROCEDURE ROOM [facility];  Service: Gastroenterology    PR UPPER GI ENDOSCOPY,BIOPSY N/A 03/13/2020    Procedure: UGI ENDOSCOPY; WITH BIOPSY, SINGLE OR MULTIPLE;  Surgeon: Helyn Numbers, MD;  Location: GI PROCEDURES [place] [facility];  Service: Gastroenterology    PR UPPER GI ENDOSCOPY,BIOPSY N/A 09/05/2021    Procedure: UGI ENDOSCOPY; WITH BIOPSY, SINGLE OR MULTIPLE;  Surgeon: Wendall Papa, MD;  Location: [facility]URES [facility];  Service: Gastroenterology    TUMOR REMOVAL      multiple-head, neck, back, hand, right flank, multiple       Family History   Problem Relation Age of Onset    No Known Problems Mother     No Known Problems  Father     No Known Problems Sister     No Known Problems Brother     Stroke Maternal Grandmother     Other Maternal Grandmother         benign lesions of liver and pancreas, further details unknown    Cancer Maternal Grandmother     Diabetes Maternal Grandmother     Hypertension Maternal Grandmother     Thyroid disease Maternal Grandmother     Arthritis Maternal Grandfather     Asthma Maternal Grandfather     COPD Paternal Grandmother         Deceased    Miscarriages / Stillbirths Paternal Grandmother     Alcohol abuse Paternal Grandfather         Deceased    No Known Problems Maternal Aunt     No Known Problems Maternal Uncle     No Known Problems Paternal Aunt     No Known Problems Paternal Uncle     Anesthesia problems Neg Hx     Broken bones Neg Hx     Cancer Neg Hx     Clotting disorder Neg Hx     Collagen disease Neg Hx     Diabetes Neg Hx     Dislocations Neg Hx     Fibromyalgia Neg Hx     Gout Neg Hx     Hemophilia Neg Hx     Osteoporosis Neg Hx     Rheumatologic disease Neg Hx     Scoliosis Neg Hx     Severe sprains Neg Hx     Sickle cell anemia Neg Hx     Spinal Compression Fracture Neg Hx     Melanoma Neg Hx     Basal cell carcinoma Neg Hx     Squamous cell carcinoma Neg Hx           Social History     Socioeconomic History    Marital status: Single   Tobacco Use    Smoking status: Never     Passive exposure: Past    Smokeless tobacco: Never   Vaping Use    Vaping Use: Never used   Substance and Sexual Activity    Alcohol use: Never    Drug use: Never    Sexual activity: Never   Other Topics Concern    Do you use sunscreen? Yes    Tanning bed use? No    Are you easily burned? No    Excessive sun exposure? No    Blistering sunburns? 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       Allergies: is allergic to adhesive tape-silicones; ferrlecit [sodium ferric gluconat-sucrose]; methylnaltrexone; neomycin; papaya; morphine; zosyn [piperacillin-tazobactam]; compazine [prochlorperazine]; latex, natural rubber; and opioids - morphine analogues.    Medications:   Meds:   acetaminophen  1,000 mg Oral TID    diclofenac sodium  2 g Topical QID    famotidine  20 mg Oral Nightly    gabapentin  300 mg Oral Nightly    lidocaine  1 patch Transdermal Q24H    mirtazapine  7.5 mg Oral Nightly    OLANZapine  2.5 mg Oral Nightly    ondansetron  4 mg Intravenous Q6H    ondansetron  4 mg Intravenous Once    SORAfenib  200 mg Oral Daily     Continuous Infusions:   IP okay to treat      lactated Ringers 10 mL/hr (12/10/21 2034)    sodium chloride       PRN Meds:.baclofen, calcium carbonate, dexAMETHasone, diphenhydrAMINE, diphenhydrAMINE, EPINEPHrine IM, hydrocortisone, HYDROmorphone, HYDROmorphone, hydrOXYzine, hyoscyamine, IP okay to treat, methylPREDNISolone sodium succinate, phenoL, saliva stimulant comb. no.3, sodium chloride, sodium chloride 0.9%    Objective:   Vitals: Temp:  [36.2 ??C (97.2 ??F)-36.9 ??C (98.4 ??F)] 36.6 ??C (97.9 ??F)  Heart Rate:  [82-127] 82  Resp:  [16-22] 16  BP: (117-152)/(66-138) 117/66  MAP (mmHg):  [84-143] 84  SpO2:  [98 %-99 %] 98 %  No intake/output data recorded.    Physical Exam:  BP 117/66  - Pulse 82  - Temp 36.6 ??C (97.9 ??F) (Oral)  - Resp 16  - Ht 165.1 cm (5' 5)  - Wt 49.9 kg (110 lb)  - SpO2 98%  - BMI 18.30 kg/m??      Physical Exam:  General: Thin appearing adult in no acute distress  HEENT: PERRLA, normocephalic atraumatic  CV: RRR, normal s1s2 no m/r/g  Pulm: Clear to auscultation bilaterally.   Abdominal: nontender, nondistended. +BS.  Extremities: WWP. No edema.  Neuro: AOx3, strength and sensation intact                          No focal deficits      ECOG Performance Status: 1 - Restricted in physically strenuous activity but ambulatory and able to carry out work of a light or sedentary nature, e.g., light house work, office work    Pharmacist, community  Lab Results   Component Value Date    WBC 5.3 12/09/2021    HGB 12.9 12/09/2021    HCT 38.8 12/09/2021    PLT 187 12/09/2021     Lab Results   Component Value Date    NA 141 12/11/2021    K 3.8 12/11/2021    CL 108 (H) 12/11/2021    CO2 24.0 12/11/2021    BUN 5 (L) 12/11/2021    CREATININE 0.59 (L) 12/11/2021    GLU 96 12/11/2021    CALCIUM 9.2 12/11/2021    MG 1.8 09/06/2021    PHOS 3.3 05/13/2021     Lab Results   Component Value Date    BILITOT 0.4 12/09/2021    BILIDIR 0.20 12/04/2021    PROT 5.7 12/09/2021    ALBUMIN 3.4 12/09/2021    ALT 81 (H) 12/09/2021    AST 27 12/09/2021    ALKPHOS 85 12/09/2021    GGT 13 10/31/2011     Lab Results  Component Value Date    INR 1.05 10/10/2020    APTT 24.1 10/10/2020       Imaging: Radiology studies were personally reviewed

## 2021-12-16 NOTE — Unmapped (Cosign Needed)
Gastroenterology (Luminal) Consult Service   Progress Note         Assessment & Plan:   [name] [name] is a 16F Hx [name] (pt of Dr. [name]) s/p IPAA p/w explosive and frequent stools and n/v/inability to tolerate PO. On empiric levaquin given dilated loops of bowel on CTAP to prevent bacterial translocation. S/p Pouchoscopy 12/15/2021 with normal mucosa of both efferent and afferent limb, no evidence of stricture. Two small ulcers were found at anastomosis and in efferent limb without evidence of bleeding. S/p Corpak placement at bedside on 10/6.    She is continue to struggle with crampy abdominal pain that is likely multifactorial from desmoid tumors as well as secondary to a disorders of the gut brain axis and visceral hypersensitivity. Given her recent weight loss and inability to keep up with nutritional requirements, she has been started on tube feeds via Corpak placed on 10/6 which she has been tolerating up to a rate of 30 ml/hr.     Recommendations:  Continue tube feeds via Corpak (max rate 30 ml/hr)  Agree with palliative care in switching to a scheduled PO pain medications and trying to avoid IV pain medications   Hyoscamine PRN for cramping pain  Continue small frequent meals  Chemotherapy of desmoid tumors per oncology    I have communicated recommendations with the patient's primary team    Thank you for involving [name] in the care of your patient. We will continue to follow along with you.     For questions, contact the on-call fellow for the Gastroenterology (Luminal) Consult Service at [phone].     Caryn Section, GI fellow    Interval History:   S/p Corpak placement at bedside on 10/6. She has been tolerating tube feeds up to around 30 ml/hr, but when it was increased to 50, felt the fluid come up and out of her esophagus. Says that bowel movements have been better since being on tube feeds. Yesterday, after taking PO levaquin, had an episode with facial rash and flushing, lip tingling and numbness, and some throat discomfort. Symptoms improved after benadryl. Had an episode of abdominal pain yesterday which was severe, but says its better than when she has to force herself to eat.     Objective:   Temp:  [36.7 ??C (98.1 ??F)-36.9 ??C (98.4 ??F)] 36.7 ??C (98.1 ??F)  Heart Rate:  [84-127] 85  Resp:  [18-22] 22  BP: (121-152)/(74-138) 126/83  SpO2:  [97 %-99 %] 98 %    Gen: WDWN in NAD, answers questions appropriately  Abdomen: Soft, NTND.   Extremities: No edema in the BLEs    Pertinent Labs/Studies Reviewed:   As above

## 2021-12-16 NOTE — Unmapped (Signed)
Palliative Care Progress Note      Consultation from Requesting Attending Physician:  [name] Mccoy, MD  Primary Care Provider:  Jarold [name], [facility]      Assessment/Plan:      SUMMARY:  [name] is seriously and acutely ill due to [name] s/p colectomy, desmoid tumors, complicated by co-morbid acute and chronic conditions including aneima, anxiety, chronic right shoulder pain, severe protein-calorie malnutrition, admitted with acute on chronic abdominal pain, palliative care consulted to assist with symptom management and support.    10/9: Worsened abdominal pain. Recommend scheduling hydromorphone as below.    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 Worsens with eating - has only a short period after pain meds where she can eat. At home was taking oxy 5-10 mg up to 5x daily. Here, finds oxy gives only partial relief and relying more on IV and PO dilaudid. Sees chronic pain outpatient - trigger point injections have helped significantly with R shoulder pain.  - recommend scheduling dilaudid 3 mg PO q6h + 3 mg q3h PRN breakthrough pain, first line  - continue dilaudid 1 mg IV q4h PRN severe pain breakthrough, second line -> please indicate on orders that this PRN timer is separate from oral dose   - ideally we will stop this IV option in the near future  - cont gabapentin to 300 mg nightly - next step would be to increase to TID if patinet continues to tolerate well  - Continue tylenol PO 1000 mg TID sch  - Continue lidocaine patch  - Heat, ice packs PRN  - 10/9: reviewed other options with her, she's been told celiac plexus block wouldn't help given location of her tumors, and that she's on too high dose opioids for buprenorphine patch. She's tried anti-spasmodics (dicyclomine and hyoscyamine with some effect). We discussed future considerations of increasing gabapentin, trying another long-acting opioid like a fentanyl patch, or methadone. I'll message her outpatient providers to discuss.     Nausea: improved. Currently controlled on IV zofran, now doing a trial of PO reglan  - If trial of reglan 2.5mg  q8 causes too many side effects, could trial 1mg  PO oral solution q8 hrs.   - Okay to continue IV zofran while hospitalized    Constipation: Improved.   Recently has had softer stools, up to 5-7x daily is normal for her   - Management per GI/primary, agree with daily scheduled miralax to maintain loose stools and trial of Reglan as above     Anxiety - seen by [name], AYA SW and psychiatry this admission  - Olanzapine 2.5mg  nightly  - Continue mirtazapine 7.5mg  nightly  - Continue hydroxyzine 25mg  PRN q6h for acute anxiety/insomnia    Goals of care / ACP:  Code Status:   Code Status: Full Code   Healthcare decision-maker if lacks capacity:   HCDM [facility] policy, no known patient preference): [name] [name],[name] [name] - Mother - [phone]    HCDM, back-up (If primary HCDM is unavailable): [name] [name],[name] [name] - Father - [phone]     Prognosis / prognostic understanding:  Appears to have good prognostic understanding     Current goals of care:  Hopes chemotherapy will allow her to live as normal of a life as possible.      Practical, Emotional, Spiritual Support Recommendations:  Palliative care visit today included focused interview, active listening,  therapeutic use of silence, offering support, sharing empathy and summarization of today's discussion.     Recommendations shared with primary team by phone    Thank you for this consult. Please contact page Palliative Care if there are any questions.     Subjective:     Recent Events:    Very difficult weekend, worsened abdominal pain and had a suspected allergic reaction to an antibiotic leading to abdominal cramps and rash. This has resolved, but she hasn't been able to get to a good place with her pain again. Corpak is in place, no nausea. Pain remains deep, aching, twisting.      Objective:       Function:  70% - Ambulation: Reduced / unable to do normal work, some evidence of disease / Self-Care: Full / Intake: Normal or reduced / Level of Conscious: Full    Temp:  [36.2 ??C (97.2 ??F)-36.9 ??C (98.4 ??F)] 36.6 ??C (97.9 ??F)  Heart Rate:  [82-127] 82  Resp:  [16-22] 16  BP: (117-152)/(66-138) 117/66  SpO2:  [98 %-99 %] 98 %    Physical Exam:  Young woman sitting back in bed, appears concerned, respirations even and unlabored, no LE edema b/l, abdomen is soft and tender to deep palpation in LLQ, spontaneously alert and answers questions appropriately    Testing reviewed and interpreted:   None today    I personally spent 50 minutes face-to-face and non-face-to-face in the care of this patient, which includes all pre, intra, and post visit time on the date of service.  All documented time was specific to the E/M visit and does not include any procedures that may have been performed.     See ACP Note from today for additional billable service:  No.      Johnney Killian, MD  Attending Physician, [facility] Palliative Care

## 2021-12-17 MED ADMIN — lidocaine (LIDODERM) 5 % patch 1 patch: 1 | TRANSDERMAL | @ 21:00:00

## 2021-12-17 MED ADMIN — famotidine (PEPCID) tablet 20 mg: 20 mg | ORAL | @ 01:00:00

## 2021-12-17 MED ADMIN — HYDROmorphone (DILAUDID) tablet 3 mg: 3 mg | ORAL | @ 16:00:00 | Stop: 2021-12-30

## 2021-12-17 MED ADMIN — lidocaine (LIDODERM) 5 % patch 2 patch: 2 | TRANSDERMAL | @ 21:00:00

## 2021-12-17 MED ADMIN — HYDROmorphone (PF) (DILAUDID) injection 1 mg: 1 mg | INTRAVENOUS | @ 01:00:00 | Stop: 2021-12-30

## 2021-12-17 MED ADMIN — acetaminophen (TYLENOL) tablet 1,000 mg: 1000 mg | ORAL | @ 01:00:00

## 2021-12-17 MED ADMIN — HYDROmorphone (DILAUDID) tablet 3 mg: 3 mg | ORAL | @ 21:00:00 | Stop: 2021-12-30

## 2021-12-17 MED ADMIN — HYDROmorphone (DILAUDID) tablet 3 mg: 3 mg | ORAL | @ 10:00:00 | Stop: 2021-12-30

## 2021-12-17 MED ADMIN — HYDROmorphone (DILAUDID) tablet 3 mg: 3 mg | ORAL | @ 04:00:00 | Stop: 2021-12-30

## 2021-12-17 MED ADMIN — gabapentin (NEURONTIN) capsule 300 mg: 300 mg | ORAL | @ 02:00:00

## 2021-12-17 MED ADMIN — ondansetron (ZOFRAN) injection 4 mg: 4 mg | INTRAVENOUS | @ 01:00:00

## 2021-12-17 MED ADMIN — acetaminophen (TYLENOL) tablet 1,000 mg: 1000 mg | ORAL | @ 17:00:00

## 2021-12-17 MED ADMIN — ondansetron (ZOFRAN) injection 4 mg: 4 mg | INTRAVENOUS | @ 13:00:00

## 2021-12-17 MED ADMIN — HYDROmorphone (DILAUDID) tablet 3 mg: 3 mg | ORAL | @ 20:00:00 | Stop: 2021-12-26

## 2021-12-17 MED ADMIN — HYDROmorphone (PF) (DILAUDID) injection 1 mg: 1 mg | INTRAVENOUS | @ 17:00:00 | Stop: 2021-12-30

## 2021-12-17 MED ADMIN — OLANZapine (ZyPREXA) tablet 2.5 mg: 2.5 mg | ORAL | @ 02:00:00

## 2021-12-17 MED ADMIN — HYDROmorphone (PF) (DILAUDID) injection 1 mg: 1 mg | INTRAVENOUS | @ 05:00:00 | Stop: 2021-12-30

## 2021-12-17 MED ADMIN — HYDROmorphone (PF) (DILAUDID) injection 1 mg: 1 mg | INTRAVENOUS | Stop: 2021-12-30

## 2021-12-17 MED ADMIN — mirtazapine (REMERON) tablet 7.5 mg: 7.5 mg | ORAL | @ 02:00:00

## 2021-12-17 MED ADMIN — ondansetron (ZOFRAN) injection 4 mg: 4 mg | INTRAVENOUS | @ 19:00:00

## 2021-12-17 MED ADMIN — HYDROmorphone (PF) (DILAUDID) injection 1 mg: 1 mg | INTRAVENOUS | @ 13:00:00 | Stop: 2021-12-30

## 2021-12-17 MED ADMIN — acetaminophen (TYLENOL) tablet 1,000 mg: 1000 mg | ORAL | @ 13:00:00

## 2021-12-17 MED ADMIN — diclofenac sodium (VOLTAREN) 1 % gel 2 g: 2 g | TOPICAL | @ 21:00:00

## 2021-12-17 MED ADMIN — ondansetron (ZOFRAN) injection 4 mg: 4 mg | INTRAVENOUS | @ 07:00:00

## 2021-12-17 MED ADMIN — SORAfenib (NexAVAR) tablet 200 mg **PATIENT SUPPLIED**: 200 mg | ORAL | @ 13:00:00

## 2021-12-17 NOTE — Unmapped (Signed)
Palliative Care Progress Note      Consultation from Requesting Attending Physician:  Georgiann Mccoy, MD  Primary Care Provider:  Jarold Motto, [facility]      Assessment/Plan:      SUMMARY:  [name] is seriously and acutely ill due to [name] s/p colectomy, desmoid tumors, complicated by co-morbid acute and chronic conditions including aneima, anxiety, chronic right shoulder pain, severe protein-calorie malnutrition, admitted with acute on chronic abdominal pain, palliative care consulted to assist with symptom management and support.    10/10: Slightly improved pain control today. No new recommendations, encouraged her to use PO dilaudid PRNs. If not sufficient today, will recommend increasing gabapentin tomorrow.    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 Worsens with eating - has only a short period after pain meds where she can eat. At home was taking oxy 5-10 mg up to 5x daily. Here, finds oxy gives only partial relief and relying more on IV and PO dilaudid. Sees chronic pain outpatient - trigger point injections have helped significantly with R shoulder pain.  - recommend scheduling dilaudid 3 mg PO q6h + 3 mg q3h PRN breakthrough pain, first line  - continue dilaudid 1 mg IV q4h PRN severe pain breakthrough, second line -> please indicate on orders that this PRN timer is separate from oral dose   - ideally we will stop this IV option in the near future  - cont gabapentin to 300 mg nightly - next step would be to increase to TID if patinet continues to tolerate well (likely 100-100-300 mg)  - Continue tylenol PO 1000 mg TID sch  - Continue lidocaine patch  - Heat, ice packs PRN     Nausea: improved. Currently controlled on IV zofran, now doing a trial of PO reglan  - If trial of reglan 2.5mg  q8 causes too many side effects, could trial 1mg  PO oral solution q8 hrs.   - Okay to continue IV zofran while hospitalized    Constipation: Improved.   Recently has had softer stools, up to 5-7x daily is normal for her   - Management per GI/primary, agree with daily scheduled miralax to maintain loose stools and trial of Reglan as above     Anxiety - seen by [name], AYA SW and psychiatry this admission  - Olanzapine 2.5mg  nightly  - Continue mirtazapine 7.5mg  nightly  - Continue hydroxyzine 25mg  PRN q6h for acute anxiety/insomnia    Goals of care / ACP:  Code Status:   Code Status: Full Code   Healthcare decision-maker if lacks capacity:   HCDM [facility] policy, no known patient preference): Mallen,[name] - Mother - [phone]    HCDM, back-up (If primary HCDM is unavailable): Freeland,Jeff - Father - [phone]     Prognosis / prognostic understanding:  Appears to have good prognostic understanding     Current goals of care:  Hopes chemotherapy will allow her to live as normal of a life as possible.      Practical, Emotional, Spiritual Support Recommendations:  Palliative care visit today included focused interview, active listening, therapeutic use of silence, offering support, sharing empathy and summarization of today's discussion.     Recommendations shared with primary team via Epic chat    Thank you for this consult. Please contact page Palliative Care if there are any questions.  Subjective:     Recent Events:    Moved rooms overnight. Started on scheduled hydromorphone PO 3 mg q6h yesterday. Received 3 doses of IV hydromorphone PRN, 0 doses of PO hydromorphone PRN. Today we talked about it being safe to use PO hydromorphone PRN after receiving a scheduled dose, educated on max effect of dose being seen about 1 hour after oral administration. Today she is feeling like things are going in right direction, hoping to talk to nutrition about her nutrition plans.      Objective:       Function:  70% - Ambulation: Reduced / unable to do normal work, some evidence of disease / Self-Care: Full / Intake: Normal or reduced / Level of Conscious: Full    Temp:  [36.2 ??C (97.1 ??F)-36.6 ??C (97.9 ??F)] 36.4 ??C (97.6 ??F)  Heart Rate:  [68-85] 74  Resp:  [16-18] 18  BP: (103-146)/(61-77) 121/61  SpO2:  [97 %-98 %] 97 %    Physical Exam:  Young woman sitting back in bed, respirations even and unlabored, no LE edema b/l, spontaneously alert and answers questions appropriately    Testing reviewed and interpreted:   None today    I personally spent 40 minutes face-to-face and non-face-to-face in the care of this patient, which includes all pre, intra, and post visit time on the date of service.  All documented time was specific to the E/M visit and does not include any procedures that may have been performed.     See ACP Note from today for additional billable service:  No.      Johnney Killian, MD  Attending Physician, [facility]

## 2021-12-17 NOTE — Unmapped (Signed)
Daily Progress Note    Assessment/Plan:    Principal Problem:    Intractable abdominal pain  Active Problems:    [name]    Intestinal polyps    Dehydration    Physical deconditioning    Severe protein-calorie malnutrition (CMS-HCC)    Winged scapula of right side    Lower abdominal pain    Generalized anxiety disorder with panic attacks  Resolved Problems:    * No resolved hospital problems. *   Malnutrition Evaluation as performed by RD, LDN: Severe Protein-Calorie Malnutrition in the context of chronic illness (12/11/21 1143)             [name] [name] is a 21 y.o. female who presented to [facility] with Intractable abdominal pain.    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    - Appreciate GI / palliative care assessment  - Continue PO (scheduled) dilaudid with PO dilaudid/IV dilaudid PRN with preference to oral dilaudid when possible  - Continue gabapentin 300 at bedtime  - Tylenol 1000 TID  - Lidocaine Patch  - Heating pad   - Bowel regimen  - Continue Sorafenib   - Continue hyocyamine  - Hold reglan/miralax       Anxiety   - Continue zyprexa 2.5qhs  - Atarax 25 q6prn  - Remeron 7.5 at bedtime    Severe Protein-Calorie Malnutrition in the context of chronic illness (12/11/21 1143)  Energy Intake: < or equal to 75% of estimated energy requirement for > or equal to 1 month  Interpretation of Wt. Loss: > 7.5% x 3 month  Malnutrition Score: 2  Continue tube feeds as tolerated     Wt Readings from Last 10 Encounters:   12/09/21 49.9 kg (110 lb)   12/03/21 51.6 kg (113 lb 12.8 oz)   10/23/21 52.3 kg (115 lb 6.4 oz)   10/02/21 53.1 kg (117 lb)   10/02/21 53.3 kg (117 lb 9.6 oz)   08/27/21 53.1 kg (117 lb)   08/01/21 57.2 kg (126 lb)   07/16/21 56.7 kg (125 lb)   06/28/21 56.9 kg (125 lb 6.4 oz)   06/21/21 56 kg (123 lb 8 oz)        I personally spent more than 50 minutes in chart review, discussing and coordinating plan of care with consults / nursing / patient and/or family.      ___________________________________________________________________    Subjective:    Reports lip swelling last night - pt associated this with levaquin, which has now been completed.      No results found for this or any previous visit (from the past 24 hour(s)).    Labs/Studies:  Labs and Studies from the last 24hrs per EMR and Reviewed    Objective:  Temp:  [36.2 ??C (97.2 ??F)-36.7 ??C (98.1 ??F)] 36.6 ??C (97.9 ??F)  Heart Rate:  [82-127] 82  Resp:  [16-22] 16  BP: (117-152)/(66-138) 117/66  SpO2:  [98 %-99 %] 98 %    General: No acute distress.  Appears stated age.    HEENT: MMM, oropharynx clear.  NGT in place.    Neck: Supple, no visible JVD  Cardiac: RRR, no M/R/G  Pulmonary: Normal work of breathing, no wheezes or crackles  Abdomen: No abdominal distention, pain, rebound or guarding.    Skin: No jaundice. No rashes or lesions.  Extremities: No edema, cyanosis, or clubbing. Warm.  Moderate to severe evidence  of fat / muscle wasting.   Neuro: CN 2-12 intact, no gross motor deficits  Psych: Oriented to self, place, and time. Normal speech.  Flat affect

## 2021-12-17 NOTE — Unmapped (Signed)
Daily Progress Note    Assessment/Plan:    Principal Problem:    Intractable abdominal pain  Active Problems:    [name]    Intestinal polyps    Dehydration    Physical deconditioning    Severe protein-calorie malnutrition (CMS-HCC)    Winged scapula of right side    Lower abdominal pain    Generalized anxiety disorder with panic attacks  Resolved Problems:    * No resolved hospital problems. *   Malnutrition Evaluation as performed by RD, LDN: Severe Protein-Calorie Malnutrition in the context of chronic illness (12/11/21 1143)             [name] [name] is a 21 y.o. female who presented to [facility] with Intractable abdominal pain.    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    - Appreciate GI / palliative care assessment  - Continue PO (scheduled) dilaudid with PO dilaudid/IV dilaudid PRN with preference to oral dilaudid when possible  - Continue gabapentin 300 at bedtime  - Tylenol 1000 TID  - Lidocaine Patch  - Heating pad   - Bowel regimen  - Continue Sorafenib   - Continue hyocyamine  - Hold reglan/miralax       Anxiety   - Continue zyprexa 2.5qhs  - Atarax 25 q6prn  - Remeron 7.5 at bedtime    Severe Protein-Calorie Malnutrition in the context of chronic illness (12/11/21 1143)  Energy Intake: < or equal to 75% of estimated energy requirement for > or equal to 1 month  Interpretation of Wt. Loss: > 7.5% x 3 month  Malnutrition Score: 2  Continue tube feeds as tolerated - will reconsult nutrition today to discuss plan for nutrition at home (ie nightly feeds vs continuous, etc)     Wt Readings from Last 10 Encounters:   12/09/21 49.9 kg (110 lb)   12/03/21 51.6 kg (113 lb 12.8 oz)   10/23/21 52.3 kg (115 lb 6.4 oz)   10/02/21 53.1 kg (117 lb)   10/02/21 53.3 kg (117 lb 9.6 oz)   08/27/21 53.1 kg (117 lb)   08/01/21 57.2 kg (126 lb)   07/16/21 56.7 kg (125 lb)   06/28/21 56.9 kg (125 lb 6.4 oz) 06/21/21 56 kg (123 lb 8 oz)        I personally spent more than 50 minutes in chart review, discussing and coordinating plan of care with consults / nursing / patient and/or family.      ___________________________________________________________________    Subjective:    No acute events or new complaints today.      No results found for this or any previous visit (from the past 24 hour(s)).    Labs/Studies:  Labs and Studies from the last 24hrs per EMR and Reviewed    Objective:  Temp:  [36.2 ??C (97.1 ??F)-36.6 ??C (97.9 ??F)] 36.4 ??C (97.6 ??F)  Heart Rate:  [68-85] 74  Resp:  [16-18] 18  BP: (103-146)/(61-77) 121/61  SpO2:  [97 %-98 %] 97 %    General: No acute distress.  Appears stated age.    HEENT: MMM, oropharynx clear.  NGT in place.    Neck: Supple, no visible JVD  Cardiac: RRR, no M/R/G  Pulmonary: Normal work of breathing, no wheezes or crackles  Abdomen: No abdominal distention, pain, rebound or guarding.    Skin: No jaundice. No rashes or lesions.  Extremities: No edema, cyanosis, or  clubbing. Warm.  Moderate to severe evidence of fat / muscle wasting.   Neuro: CN 2-12 intact, no gross motor deficits  Psych: Oriented to self, place, and time. Normal speech.  Flat affect

## 2021-12-17 NOTE — Unmapped (Signed)
Problem: Adult Inpatient Plan of Care  Goal: Plan of Care Review  Outcome: Ongoing - Unchanged  Goal: Patient-Specific Goal (Individualized)  Outcome: Ongoing - Unchanged  Goal: Absence of Hospital-Acquired Illness or Injury  Outcome: Ongoing - Unchanged  Goal: Optimal Comfort and Wellbeing  Outcome: Ongoing - Unchanged  Goal: Readiness for Transition of Care  Outcome: Ongoing - Unchanged  Goal: Rounds/Family Conference  Outcome: Ongoing - Unchanged     Problem: Pain Acute  Goal: Acceptable Pain Control and Functional Ability  Outcome: Ongoing - Unchanged     Problem: Latex Allergy  Goal: Absence of Allergy Symptoms  Outcome: Ongoing - Unchanged     Problem: Oral Intake Inadequate  Goal: Improved Oral Intake  Outcome: Ongoing - Unchanged     Problem: Infection  Goal: Absence of Infection Signs and Symptoms  Outcome: Ongoing - Unchanged     Problem: Fall Injury Risk  Goal: Absence of Fall and Fall-Related Injury  Outcome: Ongoing - Unchanged     Problem: Skin Injury Risk Increased  Goal: Skin Health and Integrity  Outcome: Ongoing - Unchanged  Intervention: Optimize Skin Protection  Recent Flowsheet Documentation  Taken 12/16/2021 2032 by Marya Fossa, RN  Pressure Reduction Techniques: frequent weight shift encouraged

## 2021-12-17 NOTE — Unmapped (Signed)
Pain is poorly controlled. Pt is admittedly anxious. Tube feeds restarted.  VSS. Family at bedside.      Problem: Adult Inpatient Plan of Care  Goal: Plan of Care Review  Outcome: Progressing  Goal: Patient-Specific Goal (Individualized)  Outcome: Progressing  Goal: Absence of Hospital-Acquired Illness or Injury  Outcome: Progressing  Intervention: Prevent Skin Injury  Recent Flowsheet Documentation  Taken 12/16/2021 1200 by Iver Nestle, RN  Skin Protection: adhesive use limited  Intervention: Prevent and Manage VTE (Venous Thromboembolism) Risk  Recent Flowsheet Documentation  Taken 12/16/2021 1200 by Iver Nestle, RN  VTE Prevention/Management: ambulation promoted  Goal: Optimal Comfort and Wellbeing  Outcome: Progressing  Goal: Readiness for Transition of Care  Outcome: Progressing  Goal: Rounds/Family Conference  Outcome: Progressing     Problem: Pain Acute  Goal: Acceptable Pain Control and Functional Ability  Outcome: Progressing     Problem: Latex Allergy  Goal: Absence of Allergy Symptoms  Outcome: Progressing     Problem: Oral Intake Inadequate  Goal: Improved Oral Intake  Outcome: Progressing     Problem: Infection  Goal: Absence of Infection Signs and Symptoms  Outcome: Progressing     Problem: Fall Injury Risk  Goal: Absence of Fall and Fall-Related Injury  Outcome: Progressing  Intervention: Identify and Manage Contributors  Recent Flowsheet Documentation  Taken 12/16/2021 1200 by Iver Nestle, RN  Self-Care Promotion: independence encouraged     Problem: Skin Injury Risk Increased  Goal: Skin Health and Integrity  Outcome: Progressing  Intervention: Optimize Skin Protection  Recent Flowsheet Documentation  Taken 12/16/2021 1200 by Iver Nestle, RN  Pressure Reduction Techniques: frequent weight shift encouraged  Pressure Reduction Devices: pressure-redistributing mattress utilized  Skin Protection: adhesive use limited

## 2021-12-18 MED ADMIN — ondansetron (ZOFRAN) injection 4 mg: 4 mg | INTRAVENOUS | @ 12:00:00

## 2021-12-18 MED ADMIN — HYDROmorphone (DILAUDID) tablet 3 mg: 3 mg | ORAL | @ 20:00:00 | Stop: 2021-12-26

## 2021-12-18 MED ADMIN — HYDROmorphone (DILAUDID) tablet 3 mg: 3 mg | ORAL | @ 05:00:00 | Stop: 2021-12-30

## 2021-12-18 MED ADMIN — acetaminophen (TYLENOL) tablet 1,000 mg: 1000 mg | ORAL | @ 03:00:00

## 2021-12-18 MED ADMIN — famotidine (PEPCID) tablet 20 mg: 20 mg | ORAL | @ 03:00:00

## 2021-12-18 MED ADMIN — HYDROmorphone (DILAUDID) tablet 3 mg: 3 mg | ORAL | @ 16:00:00 | Stop: 2021-12-30

## 2021-12-18 MED ADMIN — OLANZapine (ZyPREXA) tablet 2.5 mg: 2.5 mg | ORAL | @ 03:00:00

## 2021-12-18 MED ADMIN — mirtazapine (REMERON) tablet 7.5 mg: 7.5 mg | ORAL | @ 03:00:00

## 2021-12-18 MED ADMIN — diclofenac sodium (VOLTAREN) 1 % gel 2 g: 2 g | TOPICAL | @ 10:00:00

## 2021-12-18 MED ADMIN — SORAfenib (NexAVAR) tablet 200 mg **PATIENT SUPPLIED**: 200 mg | ORAL | @ 12:00:00

## 2021-12-18 MED ADMIN — ondansetron (ZOFRAN) injection 4 mg: 4 mg | INTRAVENOUS | @ 20:00:00

## 2021-12-18 MED ADMIN — HYDROmorphone (PF) (DILAUDID) injection 1 mg: 1 mg | INTRAVENOUS | @ 12:00:00 | Stop: 2021-12-18

## 2021-12-18 MED ADMIN — gabapentin (NEURONTIN) capsule 100 mg: 100 mg | ORAL | @ 20:00:00

## 2021-12-18 MED ADMIN — gabapentin (NEURONTIN) capsule 300 mg: 300 mg | ORAL | @ 03:00:00

## 2021-12-18 MED ADMIN — ondansetron (ZOFRAN) injection 4 mg: 4 mg | INTRAVENOUS | @ 03:00:00

## 2021-12-18 MED ADMIN — HYDROmorphone (DILAUDID) tablet 3 mg: 3 mg | ORAL | @ 22:00:00 | Stop: 2021-12-30

## 2021-12-18 MED ADMIN — HYDROmorphone (PF) (DILAUDID) injection 1 mg: 1 mg | INTRAVENOUS | @ 03:00:00 | Stop: 2021-12-30

## 2021-12-18 MED ADMIN — HYDROmorphone (DILAUDID) tablet 3 mg: 3 mg | ORAL | @ 10:00:00 | Stop: 2021-12-30

## 2021-12-18 NOTE — Unmapped (Signed)
Palliative Care Progress Note      Consultation from Requesting Attending Physician:  [name] Plummer, MD  Primary Care Provider:  Jarold [name], [name]      Assessment/Plan:      SUMMARY:  [name] is seriously and acutely ill due to [name] s/p colectomy, desmoid tumors, complicated by co-morbid acute and chronic conditions including aneima, anxiety, chronic right shoulder pain, severe protein-calorie malnutrition, admitted with acute on chronic abdominal pain, palliative care consulted to assist with symptom management and support.    10/11: Recommend increasing gabapentin as below.    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 Worsens with eating - has only a short period after pain meds where she can eat. At home was taking oxy 5-10 mg up to 5x daily. Here, finds oxy gives only partial relief and relying more on IV and PO dilaudid. Sees chronic pain outpatient - trigger point injections have helped significantly with R shoulder pain.  - recommend scheduling dilaudid 3 mg PO q6h + 3 mg q3h PRN breakthrough pain, first line  - continue dilaudid 1 mg IV q4h PRN severe pain breakthrough, second line -> please indicate on orders that this PRN timer is separate from oral dose. Again encouraged her to use PO PRN dose first as we need to determine if this is effective for a home regimen  - increase gabapentin to 100-100-300 mg dosing (for TID drug administration at varying doses) --> the 300 mg dose provided her some benefit, so worthwhile to trial a higher dose given persistent pain  - Continue tylenol PO 1000 mg TID sch  - Continue lidocaine patch  - Heat, ice packs PRN     Nausea: improved. Currently controlled on IV zofran, now doing a trial of PO reglan  - If trial of reglan 2.5mg  q8 causes too many side effects, could trial 1mg  PO oral solution q8 hrs.   - Okay to continue IV zofran while hospitalized     Anxiety - seen by [name] [name], AYA SW and psychiatry this admission  - Olanzapine 2.5mg  nightly  - Continue mirtazapine 7.5mg  nightly  - Continue hydroxyzine 25mg  PRN q6h for acute anxiety/insomnia    Goals of care / ACP:  Code Status:   Code Status: Full Code   Healthcare decision-maker if lacks capacity:   HCDM [name] [name] policy, no known patient preference): [name] [name],[name] [name] - Mother - [phone]    HCDM, back-up (If primary HCDM is unavailable): [name] [name],[name] [name] - Father - [phone]     Prognosis / prognostic understanding:  Appears to have good prognostic understanding     Current goals of care:  Hopes chemotherapy will allow her to live as normal of a life as possible.      Practical, Emotional, Spiritual Support Recommendations:  Palliative care visit today included focused interview, active listening, therapeutic use of silence, offering support, sharing empathy and summarization of today's discussion.     Recommendations shared with primary team via Epic chat    Thank you for this consult. Please contact page Palliative Care if there are any questions.     Subjective:     Recent Events:    Last night tube feed rate increased to 50 ml/hr, this has been tolerated from a nausea perspective but she had increased abdominal pain and used 4 doses of IV PRN dilaudid in last  24 hours. Only used 1 PRN PO dose. She shared that she felt like she needed the IV because it is faster acting. Today we talked about scheduling more baseline meds (like gabapentin)       Objective:       Function:  70% - Ambulation: Reduced / unable to do normal work, some evidence of disease / Self-Care: Full / Intake: Normal or reduced / Level of Conscious: Full    Temp:  [36 ??C (96.8 ??F)-36.8 ??C (98.2 ??F)] 36.6 ??C (97.8 ??F)  Heart Rate:  [61-74] 61  Resp:  [17-18] 18  BP: (107-115)/(58-66) 109/59  SpO2:  [96 %-99 %] 96 %    Physical Exam:  Young woman sitting back in bed, respirations even and unlabored, no LE edema b/l, spontaneously alert and answers questions appropriately    Testing reviewed and interpreted:   None today    I personally spent 45 minutes face-to-face and non-face-to-face in the care of this patient, which includes all pre, intra, and post visit time on the date of service.  All documented time was specific to the E/M visit and does not include any procedures that may have been performed.     See ACP Note from today for additional billable service:  No.      [name] Killian, MD  Attending Physician, [facility] Palliative Care

## 2021-12-18 NOTE — Unmapped (Signed)
VENOUS ACCESS TEAM PROCEDURE    Nurse request was placed for a PIV by Venous Access Team (VAT).  Patient was assessed at bedside for placement of a PIV. PPE were donned per protocol.  Access was obtained. Blood return noted.  Dressing intact and device well secured.  Flushed with normal saline.  See LDA for details.  Pt advised to inform RN of any s/s of discomfort at the PIV site.    Workup / Procedure Time:  15 minutes       Primary  RN was notified.       Thank you,     Carloyn Jaeger, RN RN Venous Access Team

## 2021-12-18 NOTE — Unmapped (Signed)
Appreciate nutrition consult. Consult not attached to note. Seen full RD note below.     [address] MS, RD, LD, CNSC  [phone]

## 2021-12-18 NOTE — Unmapped (Signed)
Pain was adequately controlled with scheduled and PRN pain meds. Tube feeds increased to 40 prior to end of shift. Family at bedside.  Problem: Adult Inpatient Plan of Care  Goal: Plan of Care Review  Outcome: Progressing  Goal: Patient-Specific Goal (Individualized)  Outcome: Progressing  Goal: Absence of Hospital-Acquired Illness or Injury  Outcome: Progressing  Goal: Optimal Comfort and Wellbeing  Outcome: Progressing  Goal: Readiness for Transition of Care  Outcome: Progressing  Goal: Rounds/Family Conference  Outcome: Progressing     Problem: Pain Acute  Goal: Acceptable Pain Control and Functional Ability  Outcome: Progressing     Problem: Latex Allergy  Goal: Absence of Allergy Symptoms  Outcome: Progressing     Problem: Oral Intake Inadequate  Goal: Improved Oral Intake  Outcome: Progressing     Problem: Infection  Goal: Absence of Infection Signs and Symptoms  Outcome: Progressing     Problem: Fall Injury Risk  Goal: Absence of Fall and Fall-Related Injury  Outcome: Progressing     Problem: Skin Injury Risk Increased  Goal: Skin Health and Integrity  Outcome: Progressing

## 2021-12-18 NOTE — Unmapped (Signed)
Additional order received and the patient is already on the caseload. Continue with  plan of care.

## 2021-12-18 NOTE — Unmapped (Addendum)
Daily Progress Note    Assessment/Plan:    Principal Problem:    Intractable abdominal pain  Active Problems:    [name]    Intestinal polyps    Dehydration    Physical deconditioning    Severe protein-calorie malnutrition (CMS-HCC)    Winged scapula of right side    Lower abdominal pain    Generalized anxiety disorder with panic attacks  Resolved Problems:    * No resolved hospital problems. *   Malnutrition Evaluation as performed by RD, LDN: Severe Protein-Calorie Malnutrition in the context of chronic illness (12/11/21 1143)             [name] [name] is a 21 y.o. female who presented to [facility] with Intractable abdominal pain.    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    - Appreciate GI / palliative care assessment  - Continue PO (scheduled) dilaudid with PO dilaudid/IV dilaudid PRN with preference to oral dilaudid when possible  - Increase total daily dosing of gabapentin to 100 mg/100 mg/300 mg.   - Tylenol 1000 TID  - Lidocaine Patch  - Heating pad   - Bowel regimen  - Continue Sorafenib   - Continue hyocyamine  - Hold reglan/miralax       Anxiety   - Continue zyprexa 2.5qhs  - Atarax 25 q6prn  - Remeron 7.5 at bedtime    Severe Protein-Calorie Malnutrition in the context of chronic illness (12/11/21 1143)  Energy Intake: < or equal to 75% of estimated energy requirement for > or equal to 1 month  Interpretation of Wt. Loss: > 7.5% x 3 month  Malnutrition Score: 2  - Currently up to goal tube feed rate of 50 ml/hr  - Per GI, ideal plan would be going home with corpak in place hoping that tube feeds will be temporary. However in discussion with case management, home health companies will not accept a patient with corpak. GI to consider PEG placement.     Addendum: Spoke extensively with CM and with other hospitalists regarding possibility of corpak at discharge. Typically this is not done in adult patients, though it may be possible on an individual basis depending on discussion with medical directors of home health companies. CM to send [facility] referrals (unfortunately not in catchment area for [facility]) and will attempt to discuss patient's case with HH company/companies.    Wt Readings from Last 10 Encounters:   12/09/21 49.9 kg (110 lb)   12/03/21 51.6 kg (113 lb 12.8 oz)   10/23/21 52.3 kg (115 lb 6.4 oz)   10/02/21 53.1 kg (117 lb)   10/02/21 53.3 kg (117 lb 9.6 oz)   08/27/21 53.1 kg (117 lb)   08/01/21 57.2 kg (126 lb)   07/16/21 56.7 kg (125 lb)   06/28/21 56.9 kg (125 lb 6.4 oz)   06/21/21 56 kg (123 lb 8 oz)        I personally spent more than 50 minutes in chart review, discussing and coordinating plan of care with consults / nursing / patient and/or family.      DVT ppx: SCDs  Full code  Diet: regular diet + tube feeds  Dispo: Home with HH (corpak vs PEG)    K. Darlyne Russian, MD  Division of Hospital Medicine      ___________________________________________________________________    Subjective:  Patient reports slight increase in pain last night with bumping  up her tube feed rate to 50 ml and states this was why she used IV over PO dilaudid. Otherwise feeling sleepy today because of little sleep in the last several days.    No results found for this or any previous visit (from the past 24 hour(s)).    Labs/Studies:  Labs and Studies from the last 24hrs per EMR and Reviewed    Objective:  Temp:  [36.6 ??C (97.8 ??F)-36.8 ??C (98.2 ??F)] 36.6 ??C (97.8 ??F)  Heart Rate:  [61-67] 61  Resp:  [17-18] 18  BP: (107-109)/(59-66) 109/59  SpO2:  [96 %-99 %] 96 %    General: No acute distress.  Appears stated age.    HEENT: MMM, oropharynx clear.  NGT in place.    Neck: Supple, no visible JVD  Cardiac: RRR, no M/R/G  Pulmonary: Normal work of breathing, no wheezes or crackles  Abdomen: No abdominal distention, pain, rebound or guarding.    Skin: No jaundice. No rashes or lesions.  Extremities: No edema, cyanosis, or clubbing. Warm.  Moderate to severe evidence of fat / muscle wasting.   Neuro: CN 2-12 intact, no gross motor deficits  Psych: Oriented to self, place, and time. Normal speech.  Flat affect

## 2021-12-18 NOTE — Unmapped (Signed)
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 On empiric levaquin given dilated loops of bowel on CTAP to prevent bacterial translocation. S/p Pouchoscopy 12/17/2021 with normal mucosa of both efferent and afferent limb, no evidence of stricture. Two small ulcers were found at anastomosis and in efferent limb without evidence of bleeding. S/p Corpak placement at bedside on 10/6.    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 Given her recent weight loss and inability to keep up with nutritional requirements, she has been started on tube feeds via Corpak placed on 10/6 which she has been tolerating up to a rate of 50 ml/hr. However, per primary team and case management she is unable to leave the hospital with a Corpak and will need a PEG to be placed. We discussed PEG with patient and family and they are amenable as long as her surgical oncologist is ok with the plan.     Recommendations:  Continue tube feeds via Corpak (goal rate 50 ml/hr)  Will follow-up with plan for PEG with her oncologist Dr. [name]   Agree with palliative care in switching to a scheduled PO pain medications and trying to avoid IV pain medications   Hyoscamine PRN for cramping pain  Continue small frequent meals  Chemotherapy of desmoid tumors per oncology  Patient has follow-up with Dr. [name] on 10/19    I have communicated recommendations with the patient's primary team    Thank you for involving [name] in the care of your patient. We will sign-off at this time, please re-contact if additional questions or a new need for consultation arises.     For questions, contact the on-call fellow for the Gastroenterology (Luminal) Consult Service at [phone].     Caryn Section, GI fellow    Interval History:   S/p Corpak placement at bedside on 10/6. She has been tolerating tube feeds up to around 50 ml/hr now which is at goal. She is still having abdominal pain but is working on a pain management regimen that she can go home on.     Objective:   Temp:  [36 ??C (96.8 ??F)-36.8 ??C (98.2 ??F)] 36.6 ??C (97.8 ??F)  Heart Rate:  [61-74] 61  Resp:  [17-18] 18  BP: (107-115)/(58-66) 109/59  SpO2:  [96 %-99 %] 96 %    Gen: WDWN in NAD, answers questions appropriately  Abdomen: Soft, NTND.   Extremities: No edema in the BLEs    Pertinent Labs/Studies Reviewed:   As above

## 2021-12-19 LAB — CBC
HEMATOCRIT: 39.2 % (ref 34.0–44.0)
HEMOGLOBIN: 13.4 g/dL (ref 11.3–14.9)
MEAN CORPUSCULAR HEMOGLOBIN CONC: 34.1 g/dL (ref 32.0–36.0)
MEAN CORPUSCULAR HEMOGLOBIN: 29.3 pg (ref 25.9–32.4)
MEAN CORPUSCULAR VOLUME: 85.9 fL (ref 77.6–95.7)
MEAN PLATELET VOLUME: 9.8 fL (ref 6.8–10.7)
PLATELET COUNT: 191 10*9/L (ref 150–450)
RED BLOOD CELL COUNT: 4.57 10*12/L (ref 3.95–5.13)
RED CELL DISTRIBUTION WIDTH: 11.8 % — ABNORMAL LOW (ref 12.2–15.2)
WBC ADJUSTED: 6.5 10*9/L (ref 3.6–11.2)

## 2021-12-19 LAB — BASIC METABOLIC PANEL
ANION GAP: 8 mmol/L (ref 5–14)
BLOOD UREA NITROGEN: 12 mg/dL (ref 9–23)
BUN / CREAT RATIO: 24
CALCIUM: 8.9 mg/dL (ref 8.7–10.4)
CHLORIDE: 110 mmol/L — ABNORMAL HIGH (ref 98–107)
CO2: 23 mmol/L (ref 20.0–31.0)
CREATININE: 0.5 mg/dL — ABNORMAL LOW
EGFR CKD-EPI (2021) FEMALE: >90 mL/min/{1.73_m2} (ref >=60)
GLUCOSE RANDOM: 89 mg/dL (ref 70–179)
POTASSIUM: 4.1 mmol/L (ref 3.4–4.8)
SODIUM: 141 mmol/L (ref 135–145)

## 2021-12-19 LAB — PHOSPHORUS: PHOSPHORUS: 4.4 mg/dL (ref 2.4–5.1)

## 2021-12-19 LAB — MAGNESIUM: MAGNESIUM: 2 mg/dL (ref 1.6–2.6)

## 2021-12-19 MED ADMIN — ondansetron (ZOFRAN) injection 4 mg: 4 mg | INTRAVENOUS | @ 13:00:00

## 2021-12-19 MED ADMIN — HYDROmorphone (PF) (DILAUDID) injection 1 mg: 1 mg | INTRAVENOUS | @ 06:00:00 | Stop: 2021-12-30

## 2021-12-19 MED ADMIN — HYDROmorphone (PF) (DILAUDID) injection 1 mg: 1 mg | INTRAVENOUS | @ 13:00:00 | Stop: 2021-12-30

## 2021-12-19 MED ADMIN — gabapentin (NEURONTIN) capsule 100 mg: 100 mg | ORAL | @ 19:00:00

## 2021-12-19 MED ADMIN — HYDROmorphone (PF) (DILAUDID) injection 1 mg: 1 mg | INTRAVENOUS | @ 19:00:00 | Stop: 2021-12-30

## 2021-12-19 MED ADMIN — HYDROmorphone (PF) (DILAUDID) injection 1 mg: 1 mg | INTRAVENOUS | @ 01:00:00 | Stop: 2021-12-30

## 2021-12-19 MED ADMIN — gabapentin (NEURONTIN) capsule 100 mg: 100 mg | ORAL | @ 13:00:00

## 2021-12-19 MED ADMIN — acetaminophen (TYLENOL) oral liquid: 1000 mg | ORAL | @ 04:00:00

## 2021-12-19 MED ADMIN — lidocaine (LIDODERM) 5 % patch 2 patch: 2 | TRANSDERMAL | @ 01:00:00

## 2021-12-19 MED ADMIN — ondansetron (ZOFRAN) injection 4 mg: 4 mg | INTRAVENOUS | @ 19:00:00

## 2021-12-19 MED ADMIN — ondansetron (ZOFRAN) injection 4 mg: 4 mg | INTRAVENOUS | @ 06:00:00

## 2021-12-19 MED ADMIN — HYDROmorphone (DILAUDID) tablet 3 mg: 3 mg | ORAL | @ 22:00:00 | Stop: 2021-12-30

## 2021-12-19 MED ADMIN — mirtazapine (REMERON) tablet 7.5 mg: 7.5 mg | ORAL | @ 04:00:00

## 2021-12-19 MED ADMIN — acetaminophen (TYLENOL) oral liquid: 1000 mg | ORAL | @ 19:00:00

## 2021-12-19 MED ADMIN — ondansetron (ZOFRAN) injection 4 mg: 4 mg | INTRAVENOUS | @ 01:00:00

## 2021-12-19 MED ADMIN — HYDROmorphone (DILAUDID) tablet 3 mg: 3 mg | ORAL | @ 04:00:00 | Stop: 2021-12-30

## 2021-12-19 MED ADMIN — famotidine (PEPCID) tablet 20 mg: 20 mg | ORAL | @ 04:00:00

## 2021-12-19 MED ADMIN — SORAfenib (NexAVAR) tablet 200 mg **PATIENT SUPPLIED**: 200 mg | ORAL | @ 13:00:00

## 2021-12-19 MED ADMIN — HYDROmorphone (DILAUDID) tablet 3 mg: 3 mg | ORAL | @ 17:00:00 | Stop: 2021-12-30

## 2021-12-19 MED ADMIN — HYDROmorphone (DILAUDID) tablet 3 mg: 3 mg | ORAL | @ 10:00:00 | Stop: 2021-12-30

## 2021-12-19 MED ADMIN — acetaminophen (TYLENOL) oral liquid: 1000 mg | ORAL | @ 13:00:00

## 2021-12-19 MED ADMIN — OLANZapine (ZyPREXA) tablet 2.5 mg: 2.5 mg | ORAL | @ 04:00:00

## 2021-12-19 MED ADMIN — gabapentin (NEURONTIN) capsule 300 mg: 300 mg | ORAL | @ 04:00:00

## 2021-12-19 NOTE — Unmapped (Addendum)
Palliative Care Progress Note      Consultation from Requesting Attending Physician:  Jacqualin Combes, MD  Primary Care Provider:  Jarold Motto, [facility]      Assessment/Plan:      SUMMARY:  [name] is seriously and acutely ill due to [name] s/p colectomy, desmoid tumors, complicated by co-morbid acute and chronic conditions including aneima, anxiety, chronic right shoulder pain, severe protein-calorie malnutrition, admitted with acute on chronic abdominal pain, palliative care consulted to assist with symptom management and support.    10/12: Discussion today re: going home with Corpak, identifying agency that can manage this at home. Also discussed using PO PRN pain meds, taking these in advance of activity like working with PT to head off increased pain.    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 Worsens with eating - has only a short period after pain meds where she can eat. At home was taking oxy 5-10 mg up to 5x daily. Here, finds oxy gives only partial relief and relying more on IV and PO dilaudid. Sees chronic pain outpatient - trigger point injections have helped significantly with R shoulder pain.  - recommend scheduling dilaudid 3 mg PO q6h + 3 mg q3h PRN breakthrough pain, first line  - continue dilaudid 1 mg IV q4h PRN severe pain breakthrough, second line -> please indicate on orders that this PRN timer is separate from oral dose. Again encouraged her to use PO PRN dose first as we need to determine if this is effective for a home regimen  - continue gabapentin to 100-100-300 mg dosing (for TID drug administration at varying doses) --> the 300 mg dose provided her some benefit, so worthwhile to trial a higher dose given persistent pain  - Continue tylenol PO 1000 mg TID sch  - Continue lidocaine patch  - Heat, ice packs PRN  - I have messaged her outpatient pain providers to identify additional transition planning needs; currently has follow-up scheduled on 10/24 and 10/25     Nausea: improved. Currently controlled on IV zofran, now doing a trial of PO reglan  - If trial of reglan 2.5mg  q8 causes too many side effects, could trial 1mg  PO oral solution q8 hrs.   - Okay to continue IV zofran while hospitalized     Anxiety - seen by [name], AYA SW and psychiatry this admission  - Olanzapine 2.5mg  nightly  - Continue mirtazapine 7.5mg  nightly  - Continue hydroxyzine 25mg  PRN q6h for acute anxiety/insomnia    Goals of care / ACP:  Code Status:   Code Status: Full Code   Healthcare decision-maker if lacks capacity:   HCDM [facility] policy, no known patient preference): Champney,[name] - Mother - [phone]    HCDM, back-up (If primary HCDM is unavailable): Kutzer,Jeff - Father - [phone]     Prognosis / prognostic understanding:  Appears to have good prognostic understanding     Current goals of care:  Hopes chemotherapy will allow her to live as normal of a life as possible.      Practical, Emotional, Spiritual Support Recommendations:  Palliative care visit today included focused interview, active listening, therapeutic use of silence, offering support, sharing empathy and summarization of today's discussion.     Recommendations shared with primary team via Epic chat    Thank you for this consult. Please contact page  Palliative Care if there are any questions.     Subjective:     Recent Events:    Slept well yesterday afternoon, tolerating the 50 mL/hr tube feeds. Still having some bouts of severe pain, used hydromorphone IV 1 mg x3 in last 24 hours, partly related to delays in being able to get pain medicine due to hospital logistics. Also menstruating right now, which exacerbates her abdominal pain every month. Today at bedside talked with hospital medicine, patient and her mother re: discharge planning and coordinating Corpak at home.      Objective:       Function:  70% - Ambulation: Reduced / unable to do normal work, some evidence of disease / Self-Care: Full / Intake: Normal or reduced / Level of Conscious: Full    Temp:  [36.6 ??C (97.9 ??F)-36.7 ??C (98.1 ??F)] 36.7 ??C (98.1 ??F)  Heart Rate:  [75-88] 75  Resp:  [17-18] 17  BP: (99-122)/(54-63) 99/54  SpO2:  [96 %-97 %] 96 %    Physical Exam:  Young woman sitting back in bed, respirations even and unlabored, no LE edema b/l, spontaneously alert and answers questions appropriately    Testing reviewed and interpreted:   None today    I personally spent 45 minutes face-to-face and non-face-to-face in the care of this patient, which includes all pre, intra, and post visit time on the date of service.  All documented time was specific to the E/M visit and does not include any procedures that may have been performed.     See ACP Note from today for additional billable service:  No.      Johnney Killian, MD  Attending Physician, [facility]

## 2021-12-19 NOTE — Unmapped (Signed)
Daily Progress Note    Assessment/Plan:    Principal Problem:    Intractable abdominal pain  Active Problems:    [name]    Intestinal polyps    Dehydration    Physical deconditioning    Severe protein-calorie malnutrition (CMS-HCC)    Winged scapula of right side    Lower abdominal pain    Generalized anxiety disorder with panic attacks  Resolved Problems:    * No resolved hospital problems. *   Malnutrition Evaluation as performed by RD, LDN: Severe Protein-Calorie Malnutrition in the context of chronic illness (12/11/21 1143)             [name] [name] is a 21 y.o. female who presented to [facility] with Intractable abdominal pain.    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    - appreciate GI / palliative care assessment  - continue PO (scheduled) dilaudid with PO dilaudid/IV dilaudid PRN with preference to oral dilaudid when possible  - increase total daily dosing of gabapentin to 100 mg/100 mg/300 mg.   - Tylenol 1000 TID  - Lidocaine Patch  - Heating pad   - Bowel regimen  - Continue Sorafenib   - Continue hyocyamine  - Hold reglan/miralax       Anxiety   - Continue zyprexa 2.5qhs  - Atarax 25 q6prn  - Remeron 7.5 at bedtime    Severe Protein-Calorie Malnutrition in the context of chronic illness (12/11/21 1143)  Energy Intake: < or equal to 75% of estimated energy requirement for > or equal to 1 month  Interpretation of Wt. Loss: > 7.5% x 3 month  Malnutrition Score: 2    - Currently up to goal tube feed rate of 50 ml/hr  - Per GI, ideal plan would be going home with corpak in place hoping that tube feeds will be temporary.   - patient want to be on tube feeds for 16 hours and off for 8 hours for po intake    Wt Readings from Last 10 Encounters:   12/19/21 50.7 kg (111 lb 12.8 oz)   12/03/21 51.6 kg (113 lb 12.8 oz)   10/23/21 52.3 kg (115 lb 6.4 oz)   10/02/21 53.1 kg (117 lb)   10/02/21 53.3 kg (117 lb 9.6 oz)   08/27/21 53.1 kg (117 lb)   08/01/21 57.2 kg (126 lb)   07/16/21 56.7 kg (125 lb)   06/28/21 56.9 kg (125 lb 6.4 oz)   06/21/21 56 kg (123 lb 8 oz)         DVT ppx: SCDs  Full code  Diet: regular diet + tube feeds  Dispo: Home with HH (corpak )  ___________________________________________________________________    Subjective:  No acute events overnight.  Continues to have pain but overall improved.  Planning to hold TF for 8 hours daily.  Continue to wean off IV hydromorphone.      Labs/Studies:  Recent Results (from the past 24 hour(s))   CBC    Collection Time: 12/19/21  7:46 AM   Result Value Ref Range    WBC 6.5 3.6 - 11.2 10*9/L    RBC 4.57 3.95 - 5.13 10*12/L    HGB 13.4 11.3 - 14.9 g/dL    HCT 16.1 09.6 - 04.5 %    MCV 85.9 77.6 - 95.7 fL    MCH 29.3 25.9 - 32.4 pg    MCHC 34.1 32.0 -  36.0 g/dL    RDW 16.1 (L) 09.6 - 15.2 %    MPV 9.8 6.8 - 10.7 fL    Platelet 191 150 - 450 10*9/L   Basic Metabolic Panel    Collection Time: 12/19/21  7:46 AM   Result Value Ref Range    Sodium 141 135 - 145 mmol/L    Potassium 4.1 3.4 - 4.8 mmol/L    Chloride 110 (H) 98 - 107 mmol/L    CO2 23.0 20.0 - 31.0 mmol/L    Anion Gap 8 5 - 14 mmol/L    BUN 12 9 - 23 mg/dL    Creatinine 0.45 (L) 0.60 - 0.80 mg/dL    BUN/Creatinine Ratio 24     eGFR CKD-EPI (2021) Female >90 >=60 mL/min/1.81m2    Glucose 89 70 - 179 mg/dL    Calcium 8.9 8.7 - 40.9 mg/dL   Magnesium Level    Collection Time: 12/19/21  7:46 AM   Result Value Ref Range    Magnesium 2.0 1.6 - 2.6 mg/dL   Phosphorus Level    Collection Time: 12/19/21  7:46 AM   Result Value Ref Range    Phosphorus 4.4 2.4 - 5.1 mg/dL     Objective:  Temp:  [36.2 ??C (97.1 ??F)-36.7 ??C (98.1 ??F)] 36.2 ??C (97.1 ??F)  Heart Rate:  [75-93] 93  Resp:  [17-18] 18  BP: (99-127)/(54-63) 127/63  SpO2:  [96 %-98 %] 98 %    General: No acute distress.  Appears stated age.    HEENT: MMM, oropharynx clear.  NGT in place.    Cardiac: RRR, no M/R/G  Pulmonary: CTA bilaterally  Abdomen: No abdominal distention, pain, rebound or guarding.    Skin: No jaundice. No rashes or lesions.  Extremities: No edema.   Neuro: CN 2-12 intact, no gross motor deficits  Psych: Oriented to self, place, and time. Normal speech.  Flat affect

## 2021-12-19 NOTE — Unmapped (Signed)
Pt has continued to have c/o ABD pain rated up to 9/10. She has received her scheduled pain meds and has also required additional PRN pain meds during the shift. Pt reports that she does not feel comfortable taking her PRN PO pain meds shortly after her scheduled dose and has been requesting her PRN IV meds. On call provider notified. She remains independent with her ADLS. Able to make her needs known.     Problem: Adult Inpatient Plan of Care  Goal: Plan of Care Review  Outcome: Progressing  Goal: Patient-Specific Goal (Individualized)  Outcome: Progressing  Goal: Absence of Hospital-Acquired Illness or Injury  Outcome: Progressing  Intervention: Identify and Manage Fall Risk  Recent Flowsheet Documentation  Taken 12/19/2021 0200 by Modena Morrow, RN  Safety Interventions:   low bed   fall reduction program maintained   family at bedside  Taken 12/19/2021 0000 by Modena Morrow, RN  Safety Interventions:   low bed   fall reduction program maintained   family at bedside  Taken 12/18/2021 2200 by Modena Morrow, RN  Safety Interventions:   low bed   fall reduction program maintained   family at bedside  Taken 12/18/2021 2000 by Modena Morrow, RN  Safety Interventions:   low bed   fall reduction program maintained   family at bedside  Goal: Optimal Comfort and Wellbeing  Outcome: Progressing  Goal: Readiness for Transition of Care  Outcome: Progressing  Goal: Rounds/Family Conference  Outcome: Progressing     Problem: Pain Acute  Goal: Acceptable Pain Control and Functional Ability  Outcome: Progressing     Problem: Latex Allergy  Goal: Absence of Allergy Symptoms  Outcome: Progressing     Problem: Oral Intake Inadequate  Goal: Improved Oral Intake  Outcome: Progressing     Problem: Infection  Goal: Absence of Infection Signs and Symptoms  Outcome: Progressing     Problem: Fall Injury Risk  Goal: Absence of Fall and Fall-Related Injury  Outcome: Progressing  Intervention: Promote Injury-Free Environment  Recent Flowsheet Documentation  Taken 12/19/2021 0200 by Modena Morrow, RN  Safety Interventions:   low bed   fall reduction program maintained   family at bedside  Taken 12/19/2021 0000 by Modena Morrow, RN  Safety Interventions:   low bed   fall reduction program maintained   family at bedside  Taken 12/18/2021 2200 by Modena Morrow, RN  Safety Interventions:   low bed   fall reduction program maintained   family at bedside  Taken 12/18/2021 2000 by Modena Morrow, RN  Safety Interventions:   low bed   fall reduction program maintained   family at bedside     Problem: Skin Injury Risk Increased  Goal: Skin Health and Integrity  Outcome: Progressing

## 2021-12-19 NOTE — Unmapped (Signed)
Problem: Adult Inpatient Plan of Care  Goal: Plan of Care Review  Outcome: Progressing  Goal: Patient-Specific Goal (Individualized)  Outcome: Progressing  Goal: Absence of Hospital-Acquired Illness or Injury  Outcome: Progressing  Intervention: Prevent Skin Injury  Recent Flowsheet Documentation  Taken 12/18/2021 0800 by Monica Martinez, RN  Skin Protection:   adhesive use limited   incontinence pads utilized  Goal: Optimal Comfort and Wellbeing  Outcome: Progressing  Goal: Readiness for Transition of Care  Outcome: Progressing  Goal: Rounds/Family Conference  Outcome: Progressing     Problem: Pain Acute  Goal: Acceptable Pain Control and Functional Ability  Outcome: Progressing     Problem: Latex Allergy  Goal: Absence of Allergy Symptoms  Outcome: Progressing     Problem: Oral Intake Inadequate  Goal: Improved Oral Intake  Outcome: Progressing     Problem: Infection  Goal: Absence of Infection Signs and Symptoms  Outcome: Progressing     Problem: Fall Injury Risk  Goal: Absence of Fall and Fall-Related Injury  Outcome: Progressing     Problem: Skin Injury Risk Increased  Goal: Skin Health and Integrity  Outcome: Progressing  Intervention: Optimize Skin Protection  Recent Flowsheet Documentation  Taken 12/18/2021 0800 by Tonye Royalty L, RN  Pressure Reduction Techniques: frequent weight shift encouraged  Pressure Reduction Devices: pressure-redistributing mattress utilized  Skin Protection:   adhesive use limited   incontinence pads utilized

## 2021-12-19 NOTE — Unmapped (Signed)
Hi Dr [name]    Dr. Brynda Greathouse McGill has contacted [facility] requesting to speak with you directly regarding the following:    States that she would like to have a peer to peer dicussion about this patient     Dr. Aleatha Borer is available for a call Anytime at [phone]    A page has also been sent.    Thank you,  Iona Hansen  [facility]  [phone]

## 2021-12-20 MED ORDER — TACROLIMUS 0.1 % TOPICAL OINTMENT
Freq: Two times a day (BID) | TOPICAL | 0 refills | 100 days | Status: CP
Start: 2021-12-20 — End: 2022-12-20

## 2021-12-20 MED ORDER — GABAPENTIN 100 MG CAPSULE
ORAL_CAPSULE | 3 refills | 0 days | Status: CP
Start: 2021-12-20 — End: ?

## 2021-12-20 MED ORDER — TRIAMCINOLONE ACETONIDE 0.025 % TOPICAL OINTMENT
0 refills | 0 days | Status: CP
Start: 2021-12-20 — End: ?

## 2021-12-20 MED ORDER — OLANZAPINE 2.5 MG TABLET
ORAL_TABLET | Freq: Every evening | ORAL | 0 refills | 30 days | Status: CP
Start: 2021-12-20 — End: 2022-01-19

## 2021-12-20 MED ORDER — HYDROMORPHONE 2 MG TABLET
ORAL_TABLET | Freq: Four times a day (QID) | ORAL | 0 refills | 14.00000 days | Status: CP
Start: 2021-12-20 — End: 2022-01-03

## 2021-12-20 MED ORDER — HYDROXYZINE HCL 25 MG TABLET
ORAL_TABLET | Freq: Four times a day (QID) | ORAL | 0 refills | 5 days | Status: CP | PRN
Start: 2021-12-20 — End: ?

## 2021-12-20 MED ORDER — NALOXONE 4 MG/ACTUATION NASAL SPRAY
0 refills | 0 days | Status: CP
Start: 2021-12-20 — End: ?

## 2021-12-20 MED ADMIN — HYDROmorphone (DILAUDID) tablet 3 mg: 3 mg | ORAL | @ 17:00:00 | Stop: 2021-12-20

## 2021-12-20 MED ADMIN — HYDROmorphone (DILAUDID) tablet 3 mg: 3 mg | ORAL | @ 14:00:00 | Stop: 2021-12-20

## 2021-12-20 MED ADMIN — SORAfenib (NexAVAR) tablet 200 mg **PATIENT SUPPLIED**: 200 mg | ORAL | @ 14:00:00 | Stop: 2021-12-20

## 2021-12-20 MED ADMIN — acetaminophen (TYLENOL) oral liquid: 1000 mg | ORAL | @ 19:00:00 | Stop: 2021-12-20

## 2021-12-20 MED ADMIN — HYDROmorphone (DILAUDID) tablet 3 mg: 3 mg | ORAL | @ 10:00:00 | Stop: 2021-12-20

## 2021-12-20 MED ADMIN — ondansetron (ZOFRAN) injection 4 mg: 4 mg | INTRAVENOUS | @ 13:00:00 | Stop: 2021-12-20

## 2021-12-20 MED ADMIN — ondansetron (ZOFRAN) injection 4 mg: 4 mg | INTRAVENOUS | @ 07:00:00 | Stop: 2021-12-20

## 2021-12-20 MED ADMIN — OLANZapine (ZyPREXA) tablet 2.5 mg: 2.5 mg | ORAL

## 2021-12-20 MED ADMIN — acetaminophen (TYLENOL) oral liquid: 1000 mg | ORAL

## 2021-12-20 MED ADMIN — mirtazapine (REMERON) tablet 7.5 mg: 7.5 mg | ORAL | @ 03:00:00

## 2021-12-20 MED ADMIN — acetaminophen (TYLENOL) oral liquid: 1000 mg | ORAL | @ 13:00:00 | Stop: 2021-12-20

## 2021-12-20 MED ADMIN — HYDROmorphone (DILAUDID) tablet 3 mg: 3 mg | ORAL | @ 03:00:00 | Stop: 2021-12-30

## 2021-12-20 MED ADMIN — famotidine (PEPCID) tablet 20 mg: 20 mg | ORAL

## 2021-12-20 MED ADMIN — HYDROmorphone (PF) (DILAUDID) injection 1 mg: 1 mg | INTRAVENOUS | Stop: 2021-12-30

## 2021-12-20 MED ADMIN — gabapentin (NEURONTIN) capsule 100 mg: 100 mg | ORAL | @ 13:00:00 | Stop: 2021-12-20

## 2021-12-20 MED ADMIN — ondansetron (ZOFRAN) injection 4 mg: 4 mg | INTRAVENOUS

## 2021-12-20 MED ADMIN — gabapentin (NEURONTIN) capsule 300 mg: 300 mg | ORAL | @ 03:00:00

## 2021-12-20 MED ADMIN — lidocaine (LIDODERM) 5 % patch 1 patch: 1 | TRANSDERMAL | @ 03:00:00

## 2021-12-20 MED ADMIN — ondansetron (ZOFRAN) injection 4 mg: 4 mg | INTRAVENOUS | @ 19:00:00 | Stop: 2021-12-20

## 2021-12-20 MED ADMIN — lidocaine (LIDODERM) 5 % patch 2 patch: 2 | TRANSDERMAL | @ 03:00:00

## 2021-12-20 MED ADMIN — gabapentin (NEURONTIN) capsule 100 mg: 100 mg | ORAL | @ 19:00:00 | Stop: 2021-12-20

## 2021-12-20 MED ADMIN — HYDROmorphone (DILAUDID) tablet 3 mg: 3 mg | ORAL | @ 19:00:00 | Stop: 2021-12-20

## 2021-12-20 NOTE — Unmapped (Signed)
Palliative Care Progress Note      Consultation from Requesting Attending Physician:  Kateri Plummer, MD  Primary Care Provider:  Jarold Motto, [facility], [place]      Assessment/Plan:      SUMMARY:  [name] is seriously and acutely ill due to [name] s/p colectomy, desmoid tumors, complicated by co-morbid acute and chronic conditions including aneima, anxiety, chronic right shoulder pain, severe protein-calorie malnutrition, admitted with acute on chronic abdominal pain, palliative care consulted to assist with symptom management and support.    10/13: Planning for discharge today on current regimen, she anticipates pain control will be improved when she has ready access to PO PRNs at home. Please prescribe a 14-day supply of pain management medications at discharge, she will have follow-up with her outpatient providers within 12 days, who are aware of the changes to her regimen.    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 Worsens with eating - has only a short period after pain meds where she can eat. At home was taking oxy 5-10 mg up to 5x daily. Here, finds oxy gives only partial relief and relying more on IV and PO dilaudid. Sees chronic pain outpatient - trigger point injections have helped significantly with R shoulder pain.  At discharge:  - dilaudid PO 3 mg q6h + 3 mg q3h PRN breakthrough pain  - gabapentin PO 100-100-300 mg dosing  - Tylenol PO 1000 mg TID  - continue lidocaine patches, heat and ice packs PRN     Anxiety - seen by [name], AYA SW and psychiatry this admission  - Olanzapine 2.5mg  nightly  - Continue mirtazapine 7.5mg  nightly  - Continue hydroxyzine 25mg  PRN q6h for acute anxiety/insomnia    Goals of care / ACP:  Code Status:   Code Status: Full Code   Healthcare decision-maker if lacks capacity:   HCDM [facility] policy, no known patient preference): Ysaguirre,[name] - Mother - [phone]    HCDM, back-up (If primary HCDM is unavailable): Deadwyler,Jeff - Father - [phone]     Prognosis / prognostic understanding:  Appears to have good prognostic understanding     Current goals of care:  Hopes chemotherapy will allow her to live as normal of a life as possible.      Practical, Emotional, Spiritual Support Recommendations:  Palliative care visit today included focused interview, active listening, therapeutic use of silence, offering support, sharing empathy and summarization of today's discussion.     Recommendations shared with primary team via Epic chat    Thank you for this consult. Please contact page Palliative Care if there are any questions.     Subjective:     Recent Events:    Better night last night, has had some hours off of her tube feeds and is tolerating that well. She received dilaudid IV PRN x2 in last 24 hours, was worried the PO PRN would take too long to arrive. Today she is looking forward to getting home, we discussed follow-up plans and expectation that there will be future bumps in the road but can work through them as they arrive.      Objective:       Function:  70% - Ambulation: Reduced / unable to do normal work, some evidence of disease / Self-Care: Full / Intake: Normal or reduced / Level of Conscious: Full  Temp:  [36.2 ??C (97.1 ??F)-36.4 ??C (97.5 ??F)] 36.3 ??C (97.3 ??F)  Heart Rate:  [74-93] 81  Resp:  [18] 18  BP: (118-127)/(63-71) 118/71  SpO2:  [96 %-98 %] 97 %    Physical Exam:  Young woman sitting back in bed, respirations even and unlabored, no LE edema b/l, spontaneously alert and answers questions appropriately    Testing reviewed and interpreted:   None today    I personally spent 50 minutes face-to-face and non-face-to-face in the care of this patient, which includes all pre, intra, and post visit time on the date of service.  All documented time was specific to the E/M visit and does not include any procedures that may have been performed.     See ACP Note from today for additional billable service:  No.      Johnney Killian, MD  Attending Physician, [facility] Palliative Care

## 2021-12-20 NOTE — Unmapped (Signed)
Pt alert and oriented X 4. C/O abdominal pain which was relieved slightly by the pain medication scheduled as well as prns. Nothing seemed to really keep her pain under control for long periods of time per the pain assessments with the patient. Tube feeds were stopped at 1430 per order and the patient has been encouraged to up her PO intake. Feeds will be restarted at 2230 tonight. Did teach patient how to flush her corepak as she believes she would like to go home with it in place. She did express interest in potentially placing a bridle before discharge in order to hold the tube in place better at home, she is still deciding if that is what she would want. Able to express needs. All needs have been met at this time. Care ongoing.       Problem: Adult Inpatient Plan of Care  Goal: Plan of Care Review  Outcome: Progressing  Goal: Patient-Specific Goal (Individualized)  Outcome: Progressing  Goal: Absence of Hospital-Acquired Illness or Injury  Outcome: Progressing  Intervention: Identify and Manage Fall Risk  Recent Flowsheet Documentation  Taken 12/19/2021 1800 by Heinz Knuckles, RN  Safety Interventions:   aspiration precautions   fall reduction program maintained   lighting adjusted for tasks/safety   low bed   nonskid shoes/slippers when out of bed  Taken 12/19/2021 1600 by Heinz Knuckles, RN  Safety Interventions:   aspiration precautions   fall reduction program maintained   lighting adjusted for tasks/safety   low bed   nonskid shoes/slippers when out of bed  Taken 12/19/2021 1400 by Heinz Knuckles, RN  Safety Interventions:   aspiration precautions   fall reduction program maintained   lighting adjusted for tasks/safety   low bed   nonskid shoes/slippers when out of bed  Taken 12/19/2021 1200 by Heinz Knuckles, RN  Safety Interventions:   aspiration precautions   fall reduction program maintained   lighting adjusted for tasks/safety   low bed   nonskid shoes/slippers when out of bed  Taken 12/19/2021 1000 by Heinz Knuckles, RN  Safety Interventions:   aspiration precautions   fall reduction program maintained   lighting adjusted for tasks/safety   low bed   nonskid shoes/slippers when out of bed  Taken 12/19/2021 0800 by Heinz Knuckles, RN  Safety Interventions:   aspiration precautions   fall reduction program maintained   lighting adjusted for tasks/safety   low bed   nonskid shoes/slippers when out of bed  Intervention: Prevent Skin Injury  Recent Flowsheet Documentation  Taken 12/19/2021 1800 by Heinz Knuckles, RN  Skin Protection: adhesive use limited  Taken 12/19/2021 1600 by Heinz Knuckles, RN  Skin Protection: adhesive use limited  Taken 12/19/2021 1400 by Heinz Knuckles, RN  Skin Protection: adhesive use limited  Taken 12/19/2021 1200 by Heinz Knuckles, RN  Skin Protection: adhesive use limited  Taken 12/19/2021 1000 by Heinz Knuckles, RN  Skin Protection: adhesive use limited  Taken 12/19/2021 0800 by Heinz Knuckles, RN  Skin Protection: adhesive use limited  Intervention: Prevent and Manage VTE (Venous Thromboembolism) Risk  Recent Flowsheet Documentation  Taken 12/19/2021 1800 by Heinz Knuckles, RN  Activity Management:   activity adjusted per tolerance   activity encouraged  Taken 12/19/2021 1600 by Heinz Knuckles, RN  Activity Management:   activity adjusted per tolerance   activity encouraged  Taken 12/19/2021 1400 by Heinz Knuckles, RN  Activity Management:   activity adjusted  per tolerance   activity encouraged  Taken 12/19/2021 1200 by Heinz Knuckles, RN  Activity Management:   activity adjusted per tolerance   activity encouraged  Taken 12/19/2021 1000 by Heinz Knuckles, RN  Activity Management:   activity adjusted per tolerance   activity encouraged  Taken 12/19/2021 0800 by Heinz Knuckles, RN  Activity Management:   activity adjusted per tolerance   activity encouraged  VTE Prevention/Management:   ambulation promoted   anticoagulant therapy  Goal: Optimal Comfort and Wellbeing  Outcome: Progressing  Goal: Readiness for Transition of Care  Outcome: Progressing  Goal: Rounds/Family Conference  Outcome: Progressing     Problem: Pain Acute  Goal: Acceptable Pain Control and Functional Ability  Outcome: Progressing     Problem: Latex Allergy  Goal: Absence of Allergy Symptoms  Outcome: Progressing     Problem: Oral Intake Inadequate  Goal: Improved Oral Intake  Outcome: Progressing     Problem: Infection  Goal: Absence of Infection Signs and Symptoms  Outcome: Progressing     Problem: Fall Injury Risk  Goal: Absence of Fall and Fall-Related Injury  Outcome: Progressing  Intervention: Promote Injury-Free Environment  Recent Flowsheet Documentation  Taken 12/19/2021 1800 by Heinz Knuckles, RN  Safety Interventions:   aspiration precautions   fall reduction program maintained   lighting adjusted for tasks/safety   low bed   nonskid shoes/slippers when out of bed  Taken 12/19/2021 1600 by Heinz Knuckles, RN  Safety Interventions:   aspiration precautions   fall reduction program maintained   lighting adjusted for tasks/safety   low bed   nonskid shoes/slippers when out of bed  Taken 12/19/2021 1400 by Heinz Knuckles, RN  Safety Interventions:   aspiration precautions   fall reduction program maintained   lighting adjusted for tasks/safety   low bed   nonskid shoes/slippers when out of bed  Taken 12/19/2021 1200 by Heinz Knuckles, RN  Safety Interventions:   aspiration precautions   fall reduction program maintained   lighting adjusted for tasks/safety   low bed   nonskid shoes/slippers when out of bed  Taken 12/19/2021 1000 by Heinz Knuckles, RN  Safety Interventions:   aspiration precautions   fall reduction program maintained   lighting adjusted for tasks/safety   low bed   nonskid shoes/slippers when out of bed  Taken 12/19/2021 0800 by Heinz Knuckles, RN  Safety Interventions:   aspiration precautions   fall reduction program maintained   lighting adjusted for tasks/safety   low bed   nonskid shoes/slippers when out of bed     Problem: Skin Injury Risk Increased  Goal: Skin Health and Integrity  Outcome: Progressing  Intervention: Optimize Skin Protection  Recent Flowsheet Documentation  Taken 12/19/2021 1800 by Heinz Knuckles, RN  Pressure Reduction Techniques:   frequent weight shift encouraged   weight shift assistance provided  Pressure Reduction Devices:   pressure-redistributing mattress utilized   positioning supports utilized  Skin Protection: adhesive use limited  Taken 12/19/2021 1600 by Heinz Knuckles, RN  Pressure Reduction Techniques:   frequent weight shift encouraged   weight shift assistance provided  Pressure Reduction Devices:   pressure-redistributing mattress utilized   positioning supports utilized  Skin Protection: adhesive use limited  Taken 12/19/2021 1400 by Heinz Knuckles, RN  Pressure Reduction Techniques:   frequent weight shift encouraged   weight shift assistance provided  Pressure Reduction Devices:   pressure-redistributing mattress utilized   positioning supports utilized  Skin Protection: adhesive use limited  Taken 12/19/2021 1200 by Heinz Knuckles, RN  Pressure Reduction Techniques:   frequent weight shift encouraged   weight shift assistance provided  Pressure Reduction Devices:   pressure-redistributing mattress utilized   positioning supports utilized  Skin Protection: adhesive use limited  Taken 12/19/2021 1000 by Heinz Knuckles, RN  Pressure Reduction Techniques:   frequent weight shift encouraged   weight shift assistance provided  Pressure Reduction Devices:   pressure-redistributing mattress utilized   positioning supports utilized  Skin Protection: adhesive use limited  Taken 12/19/2021 0800 by Heinz Knuckles, RN  Pressure Reduction Techniques: frequent weight shift encouraged  Pressure Reduction Devices:   pressure-redistributing mattress utilized   positioning supports utilized  Skin Protection: adhesive use limited

## 2021-12-20 NOTE — Unmapped (Signed)
Pt A&O X 4 VSS and afebrile despite c/o severe pain.   Problem: Adult Inpatient Plan of Care  Goal: Plan of Care Review  Outcome: Not Progressing  Goal: Patient-Specific Goal (Individualized)  Outcome: Not Progressing  Goal: Absence of Hospital-Acquired Illness or Injury  Outcome: Not Progressing  Intervention: Identify and Manage Fall Risk  Recent Flowsheet Documentation  Taken 12/19/2021 2000 by Adonijah Baena, [place] A, RN  Safety Interventions:   enteral feeding safety   environmental modification   family at bedside   infection management   lighting adjusted for tasks/safety   low bed  Intervention: Prevent Skin Injury  Recent Flowsheet Documentation  Taken 12/19/2021 2000 by Secily Walthour, [place] A, RN  Skin Protection: adhesive use limited  Intervention: Prevent and Manage VTE (Venous Thromboembolism) Risk  Recent Flowsheet Documentation  Taken 12/19/2021 2000 by Eisen Robenson, [place] A, RN  Activity Management: activity adjusted per tolerance  Intervention: Prevent Infection  Recent Flowsheet Documentation  Taken 12/19/2021 2000 by Janvi Ammar, [place] A, RN  Infection Prevention:   cohorting utilized   environmental surveillance performed   equipment surfaces disinfected   hand hygiene promoted   personal protective equipment utilized   rest/sleep promoted   single patient room provided   visitors restricted/screened  Goal: Optimal Comfort and Wellbeing  Outcome: Not Progressing  Goal: Readiness for Transition of Care  Outcome: Not Progressing  Goal: Rounds/Family Conference  Outcome: Not Progressing     Problem: Pain Acute  Goal: Acceptable Pain Control and Functional Ability  Outcome: Not Progressing     Problem: Latex Allergy  Goal: Absence of Allergy Symptoms  Outcome: Not Progressing     Problem: Oral Intake Inadequate  Goal: Improved Oral Intake  Outcome: Not Progressing     Problem: Infection  Goal: Absence of Infection Signs and Symptoms  Outcome: Not Progressing  Intervention: Prevent or Manage Infection  Recent Flowsheet Documentation  Taken 12/19/2021 2000 by Moyinoluwa Dawe, [place] A, RN  Infection Management: aseptic technique maintained     Problem: Fall Injury Risk  Goal: Absence of Fall and Fall-Related Injury  Outcome: Not Progressing  Intervention: Promote Injury-Free Environment  Recent Flowsheet Documentation  Taken 12/19/2021 2000 by Glenola Wheat, [place] A, RN  Safety Interventions:   enteral feeding safety   environmental modification   family at bedside   infection management   lighting adjusted for tasks/safety   low bed     Problem: Skin Injury Risk Increased  Goal: Skin Health and Integrity  Outcome: Not Progressing  Intervention: Optimize Skin Protection  Recent Flowsheet Documentation  Taken 12/19/2021 2000 by Katherinne Mofield, [place] A, RN  Pressure Reduction Techniques: frequent weight shift encouraged  Head of Bed (HOB) Positioning: HOB at 30-45 degrees  Pressure Reduction Devices: pressure-redistributing mattress utilized  Skin Protection: adhesive use limited    Tube feeds started around 2300.  Slept after night time meds were given.  NAD noted.

## 2021-12-20 NOTE — Unmapped (Addendum)
Physician Discharge Summary [facility]  7 BT [facility]  [address]  [place] [address]  Dept: [phone]  Loc: [phone]     Identifying Information:   [name] [name]  10-09-2000  [id number]    Primary Care Physician: Jarold Motto, [facility]   Code Status: Full Code    Admit Date: 12/05/2021    Discharge Date: 12/20/2021     Discharge To: Home with Home Health and/or PT/OT    Discharge Service: [facility]     Discharge Attending Physician: K. Darlyne Russian, MD    Discharge Diagnoses:  Principal Problem:    Intractable abdominal pain (POA: Yes)  Active Problems:    [name] (POA: Not Applicable)    Intestinal polyps (POA: Yes)    Dehydration (POA: Yes)    Physical deconditioning (POA: Yes)    Severe protein-calorie malnutrition (CMS-HCC) (POA: Yes)    Winged scapula of right side (POA: Yes)    Lower abdominal pain (POA: Yes)    Generalized anxiety disorder with panic attacks (POA: Unknown)  Resolved Problems:    * No resolved hospital problems. *      Outpatient Provider Follow Up Issues:   [  ] Updated pain contract and prescriptions for oral dilaudid at palliative follow up  [  ] Reassess oral intake and need for supplemental tube feeds  [  ] Re-establish with dermatology      Hospital Course:   [name] [name] is a 21 y.o. 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 Her hospital course is detailed below:     Acute on chronic abdominal pain  [name] [name] reported that her pain has been worsening over the past 2 weeks until it became intolerable on her home pain regimen. She has known desmoid tumors and there has been concern from her oncologist that she is having perhaps intermittent pain related to tethering of her bowel to the desmoid tumors causing partial obstruction or intussusception. She had extensive workup including a CT abdomen and pelvis which showed no evidence of bowel obstruction, but enteric soft tissue lesions favored to reflect desmoids and nodularity in the upper right rectus. She underwent pouchoscopy with GI to evaluate for pouchitis, but this was largely unremarkable (normal mucosa of both efferent and afferent limb, no evidence of stricture. Two small ulcers at anastomosis without evidence of bleeding).     Pain is likely multifactorial, including from her desmoid tumors, from motility disorder (exacerbated by opioids), and relative constipation (expect her to have more like 4-8 BMs per day and she was having 2-3).     Treatment was directed at both controlling her pain by titrating oral and IV pain medications as well as increasing her bowel regimen. Palliative care and chronic pain teams were both involved in symptom management. She was managed with IV tylenol, prn toradol, oxycodone --> po dilaudid and IV dilaudid for breakthrough pain, and gabapentin. At discharge, patient was prescribed dilaudid 3 mg q6h scheduled and dilaudid 3 mg q3h PRN for breakthrough pain (given 2 week course to get her to next outpatient palliative care appointment) as well as gabapentin 100 mg/100 mg/300 mg. Oncology was also consulted given plan to start sorafenib outpatient for the desmoid tumors, and this was started while inpatient . She was also treated with a 10 day course of levofloxacin to prevent gut translocation. At discharge, she resumed  oral cefdinir, which she takes chronically to prevent pouchitis.     Severe Protein Calorie Malnutrition -   Secondary to above process.  A corpak was placed and patient was started on tube feeds.  Nutrition was consulted with final plan to continue tube feeds at the following setting on discharge: Osmolite 1.5 @ 50 ml/hr over 16 hours at night. This may be advanced to 55 ml/hr over 14 hours or 65 ml/hr over 12 hours depending on patient preference.     Severe Protein-Calorie Malnutrition in the context of chronic illness (12/11/21 1143)  Energy Intake: < or equal to 75% of estimated energy requirement for > or equal to 1 month  Interpretation of Wt. Loss: > 7.5% x 3 month  Malnutrition Score: 2    Wt Readings from Last 10 Encounters:   12/09/21 49.9 kg (110 lb)   12/03/21 51.6 kg (113 lb 12.8 oz)   10/23/21 52.3 kg (115 lb 6.4 oz)   10/02/21 53.1 kg (117 lb)   10/02/21 53.3 kg (117 lb 9.6 oz)   08/27/21 53.1 kg (117 lb)   08/01/21 57.2 kg (126 lb)   07/16/21 56.7 kg (125 lb)   06/28/21 56.9 kg (125 lb 6.4 oz)   06/21/21 56 kg (123 lb 8 oz)          Anxiety  Patient follows with psychiatry outpatient. Given continued uncontrolled anxiety, they saw her within the hospital and started [name] [name] on 2.5 mg olanzapine nightly. She was continued on as needed hydroxyzine and nightly mirtazapine.     [name], [name] and Desmoid fibromatosis  She follows with [facility] Dr [name] as well as by [facility] oncology palliative care (Dr Harrie Foreman and [name]) for pall care sx and pain management. Sorafenib was started in patient and patient will continue this upon discharge.     Eczematous dermatitis  [name] [name] previously followed with dermatology in 2020-2021 while on sorafenib for eczematous dermatitis. On day of discharge, she noted that she had gradually had some dryness and redness to her face and hands. She should follow up with dermatology, but I prescribed her protopic ointment to apply to her face and triamcinolone ointment to apply to her body as she had previously been prescribed.      Procedures:  Pouchoscopy 12/09/2021    ______________________________________________________________________  Discharge Medications:     Your Medication List        STOP taking these medications      buprenorphine 10 mcg/hour Ptwk transdermal patch     oxyCODONE 5 MG immediate release tablet  Commonly known as: ROXICODONE            START taking these medications      COURIERED MED OR SUPPLY  [id number], SORAFENIB 200MG  TABLET     HYDROmorphone 2 MG tablet  Commonly known as: DILAUDID  Take 1.5 tablets (3 mg total) by mouth every six (6) hours for 14 days.     HYDROmorphone 2 MG tablet  Commonly known as: DILAUDID  Take 1.5 tablets (3 mg total) by mouth every three (3) hours as needed for moderate pain or severe pain.     hydrOXYzine 25 MG tablet  Commonly known as: ATARAX  Take 1 tablet (25 mg total) by mouth every six (6) hours as needed for itching or anxiety (also insomnia).     naloxone 4 mg/actuation nasal spray  Commonly known as: NARCAN  One spray in either nostril once for known/suspected opioid overdose. May repeat every  2-3 minutes in alternating nostril til EMS arrives     OLANZapine 2.5 MG tablet  Commonly known as: ZyPREXA  Take 1 tablet (2.5 mg total) by mouth nightly.     SORAfenib 200 mg tablet  Commonly known as: NexAVAR  Take 2 tablets (400 mg total) by mouth daily.     tacrolimus 0.1 % ointment  Commonly known as: PROTOPIC  Apply 1 application. topically Two (2) times a day. Apply two times a day to the face     triamcinolone 0.025 % ointment  Commonly known as: KENALOG  Apply two times a day as needed to the body for itchy skin or rash.            CHANGE how you take these medications      gabapentin 100 MG capsule  Commonly known as: NEURONTIN  Take 100mg  in the morning and afternoon. Take 300 mg at night.  What changed: additional instructions            CONTINUE taking these medications      acetaminophen 500 MG tablet  Commonly known as: TYLENOL  Take 2 tablets (1,000 mg total) by mouth Two (2) times a day.     baclofen 5 mg Tab tablet  Commonly known as: LIORESAL  Take 1-2 tablets (5-10 mg total) by mouth two (2) times a day as needed for muscle spasms.     cefdinir 300 MG capsule  Commonly known as: OMNICEF  Take 1 capsule (300 mg total) by mouth Two (2) times a day.     diclofenac sodium 1 % gel  Commonly known as: VOLTAREN  Apply 2 g topically four (4) times a day. famotidine 20 MG tablet  Commonly known as: PEPCID  Take 1 tablet (20 mg total) by mouth nightly.     hydrocortisone 2.5 % cream  Apply 1 application. topically daily as needed.     lidocaine 5 % patch  Commonly known as: LIDODERM  Place 1 patch on the skin daily. Apply to affected area for 12 hours only each day (then remove patch)     menthol-zinc oxide 0.44-20.6 % Oint  Commonly known as: CALMOSEPTINE  Apply topically four (4) times a day as needed.     mirtazapine 7.5 MG tablet  Commonly known as: REMERON  Take 1 tablet (7.5 mg total) by mouth nightly.     ondansetron 8 MG disintegrating tablet  Commonly known as: ZOFRAN-ODT  Take 1 tablet (8 mg total) by mouth every twelve (12) hours as needed for nausea.     zinc oxide 10 % Crea  Apply 1 application. topically daily as needed.              Allergies:  Adhesive tape-silicones; Ferrlecit [sodium ferric gluconat-sucrose]; Methylnaltrexone; Neomycin; Papaya; Morphine; Zosyn [piperacillin-tazobactam]; Compazine [prochlorperazine]; Latex, natural rubber; and Opioids - morphine analogues  ______________________________________________________________________  Pending Test Results (if blank, then none):      Most Recent Labs:  CBC - Results in Past 2 Days  Result Component Current Result   WBC 6.5 (12/19/2021)   RBC 4.57 (12/19/2021)   HGB 13.4 (12/19/2021)   HCT 39.2 (12/19/2021)   MCV 85.9 (12/19/2021)   MCH 29.3 (12/19/2021)   MCHC 34.1 (12/19/2021)   MPV 9.8 (12/19/2021)   Platelet 191 (12/19/2021)     BMP - Results in Past 2 Days  Result Component Current Result   Sodium 141 (12/19/2021)   Potassium 4.1 (12/19/2021)   Chloride 110 (H) (12/19/2021)  CO2 23.0 (12/19/2021)   BUN 12 (12/19/2021)   Creatinine 0.50 (L) (12/19/2021)   EST.GFR (MDRD) Not in Time Range   Glucose 89 (12/19/2021)       Relevant Studies/Radiology (if blank, then none):  ECG 12 Lead    Result Date: 12/20/2021  NORMAL SINUS RHYTHM CANNOT RULE OUT ANTERIOR INFARCT  , AGE UNDETERMINED ABNORMAL ECG WHEN COMPARED WITH ECG OF 09-Dec-2021 07:08, NO SIGNIFICANT CHANGE WAS FOUND    XR Abdomen 1 View  Result Date: 12/15/2021   FINDINGS: Weighted enteric tube courses below the diaphragm with distal tip projecting over right hemiabdomen, likely within the second portion of the duodenum. Paucity of small bowel gas limits characterization of overall bowel gas pattern. There is marked dilation of the stomach, similar to prior. Visualized lungs are clear.     Weighted enteric tube with distal tip projecting caudally in the right hemiabdomen, likely within the second portion of the duodenum. Of note the catheter appears kinked along the proximal aspect of the weighted tip. This may be projectional. However correlate for function.    XR Abdomen 1 View  Result Date: 12/13/2021  FINDINGS: Bilateral lung bases are unremarkable. Esophagogastric tube with weighted tip overlying the expected region of the pylorus versus distal gastric antrum. Significant gaseous distention of the stomach. Paucity of bowel gas limits evaluation. No pneumoperitoneum. No acute osseous abnormality.     Esophagogastric tube with weighted tip across midline which could be in the distal antrum/pylorus versus proximal duodenum.    ECG 12 Lead  NORMAL SINUS RHYTHM NORMAL ECG WHEN COMPARED WITH ECG OF 04-Sep-2021 08:45, NO SIGNIFICANT CHANGE WAS FOUND Confirmed by Alla German [phone]) on 12/10/2021 11:38:36 AM    Pouch Exam  Result Date: 12/09/2021  Findings:      The perianal and digital rectal examinations were normal.      The ileocolonic anastomosis contained a single (solitary) three mm      ulcer. No bleeding was present. No stigmata of recent bleeding were seen.      The exam was otherwise without abnormality.      The j-pouch contained a single (solitary) three mm ulcer. No bleeding was present.                                                                                      CT Abdomen Pelvis W Contrast  Result Date: 12/06/2021  Sequelae of prior colectomy with ileoanal anastomosis. No evidence of bowel obstruction. Unchanged enteric soft tissue lesions with infiltrative features, favored to reflect desmoids. 1.8 cm enhancing nodularity in the lower right rectus musculature, favoring  a rectus desmoid, seen to better advantage on this postcontrast CT examination. Additional nodularity in the upper right rectus may be postsurgical or reflect  small desmoids.     CT Abdomen Pelvis Wo Contrast  Result Date: 12/05/2021  Redemonstration of postsurgical changes including proctocolectomy and ileoanal anastomosis. Several mesenteric soft tissue lesions with infiltrative features again favored to reflect desmoid. Visually they appear slightly more pronounced when compared to prior study best appreciated on coronal 3:32.      ______________________________________________________________________  Discharge Instructions:   Activity Instructions  Activity as tolerated              Diet Instructions       Discharge diet (specify)      Discharge Nutrition Therapy:  Enteral Nutrition  Regular       Continue tube feeds at 50 ml/hr hour over 16 hours.                Other Instructions       Call MD for:  persistent nausea or vomiting      Call MD for:  severe uncontrolled pain      Call MD for: Temperature > 38.5 Celsius ( > 101.3 Fahrenheit)      Discharge instructions      Please follow up with Dr. [name], Dr. [name], and [name] as scheduled.     You will continue tube feeds at least until your appointment with Dr. [name].     I have prescribed two topical ointments that you were previously prescribed by dermatology. One is protopic or tacrolimus ointment, which you can apply twice a day to the face. Triamcinolone is an ointment that can be applied twice a day to the body. Please call the dermatology clinic to try to arrange a follow up appointment.    Discharge instructions to patient: Call your Specialist doctor and make an appointment to see them (specify):      Specialist: Oncology and Palliative Care  Within 1-2 weeks from the time you are discharged from the hospital            Follow Up instructions and Outpatient Referrals     Ambulatory referral to Dermatology      Do you want ongoing co-management?: No    Ambulatory referral to Home Health      Is this a [facility] or [facility] Patient?: Yes    Home Health Options: Traditional Home Health    Is this patient at high risk for COVID 19 transmission and recommended to   stay at home during this pandemic?: No    Do you want agency provider parameter notifications or patient specific   provider parameter notifications?: Agency    Do you want to initiate remote patient monitoring?: No    Physician to follow patient's care: PCP    Disciplines requested:  Nursing  Physical Therapy  Occupational Therapy       Nursing requested: Other: (please enter in comments) Comment - Medication   & Disease management & education, Corpak with Tube Feeding need    Physical Therapy requested: Evaluate and treat    Occupational Therapy Requested: Evaluate and treat    Call MD for:  persistent nausea or vomiting      Call MD for:  severe uncontrolled pain      Call MD for: Temperature > 38.5 Celsius ( > 101.3 Fahrenheit)      Discharge instructions          Appointments which have been scheduled for you      Dec 26, 2021 10:00 AM  (Arrive by 9:45 AM)  RETURN IBD with Modena Nunnery, MD  [facility] GI MEDICINE [place] [facility] REGION) [address]  [place] [address]  [phone]        Dec 31, 2021 11:30 AM  (Arrive by 11:00 AM)  LAB ONLY [place] with ADULT ONC LAB  [facility] ADULT ONCOLOGY LAB [place]) [address]  [place] [address]  [phone]        Dec 31, 2021 12:00 PM  (Arrive by 11:30 AM)  RETURN PALLIATIVE CARE with Minette Headland, MD  [facility] ONCOLOGY MULTIDISCIPLINARY 2ND FLR CANCER HOSP [facility] REGION) [address]  [place] [address]  [phone]        Dec 31, 2021 12:30 PM  (Arrive by 12:00 PM)  RETURN SARCOMA ACTIVE [place] with Sheral Apley, MD  [facility] ONCOLOGY MULTIDISCIPLINARY 2ND FLR CANCER HOSP [facility] REGION) [address]  [place] [address]  [phone]        Jan 01, 2022 11:40 AM  (Arrive by 11:10 AM)  RETURN GENERAL with Nat Christen., MD  [facility] PAIN MANAGEMENT CENTER [facility] REGION) [address]  [address]  [place] [address]  [phone]        Jan 02, 2022  2:30 PM  (Arrive by 2:00 PM)  RETURN VIDEO MYCHART with [name], MD  [facility] CCSP 2ND FLR CANCER HOSP [place] REGION) [address]  [place] [address]  [phone]   Please sign into My [place] Chart at least 15 minutes before your appointment to complete the eCheck-In process. You must complete eCheck-In before you can start your video visit. We also recommend testing your audio and video connection to troubleshoot any issues before your visit begins. Click ???Join Video Visit??? to complete these checks. Once you have completed eCheck-In and tested your audio and video, click ???Join Call??? to connect to your visit.     For your video visit, you will need a computer with a working camera, speaker and microphone, a smartphone, or a tablet with internet access.    My [facility] Chart enables you to manage your health, send non-urgent messages to your provider, view your test results, schedule and manage appointments, and request prescription refills securely and conveniently from your computer or mobile device.    You can go to [email or web address] to sign in to your My [place] Chart account with your username and password. If you have forgotten your username or password, please choose the ???Forgot Username???? and/or ???Forgot Password???? links to gain access. You also can access your My [facility] Chart account with the free MyChart mobile app for Android or iPhone.    If you need assistance accessing your My [facility] [place] Chart account or for assistance in reaching your provider's office to reschedule or cancel your appointment, please call [facility] Outpatient Access Center [phone].              ______________________________________________________________________  Discharge Day Services:  BP 132/70  - Pulse 78  - Temp 35.9 ??C (96.6 ??F) (Oral)  - Resp 19  - Ht 165.1 cm (5' 5)  - Wt 50.7 kg (111 lb 12.8 oz)  - SpO2 97%  - BMI 18.60 kg/m??   Pt seen on the day of discharge and determined appropriate for discharge.    Condition at Discharge: good    Length of Discharge: I spent greater than 30 mins in the discharge of this patient.       Juanetta Beets, MD  Division of [facility] Medicine

## 2021-12-22 DIAGNOSIS — Z79891 Long term (current) use of opiate analgesic: Secondary | ICD-10-CM | POA: Diagnosis not present

## 2021-12-22 DIAGNOSIS — M728 Other fibroblastic disorders: Secondary | ICD-10-CM | POA: Diagnosis not present

## 2021-12-22 DIAGNOSIS — Z8616 Personal history of COVID-19: Secondary | ICD-10-CM | POA: Diagnosis not present

## 2021-12-22 DIAGNOSIS — F411 Generalized anxiety disorder: Secondary | ICD-10-CM | POA: Diagnosis not present

## 2021-12-22 DIAGNOSIS — D692 Other nonthrombocytopenic purpura: Secondary | ICD-10-CM | POA: Diagnosis not present

## 2021-12-22 DIAGNOSIS — K219 Gastro-esophageal reflux disease without esophagitis: Secondary | ICD-10-CM | POA: Diagnosis not present

## 2021-12-22 DIAGNOSIS — K5900 Constipation, unspecified: Secondary | ICD-10-CM | POA: Diagnosis not present

## 2021-12-22 DIAGNOSIS — D1391 Familial adenomatous polyposis: Secondary | ICD-10-CM | POA: Diagnosis not present

## 2021-12-22 DIAGNOSIS — M2180 Other specified acquired deformities of unspecified limb: Secondary | ICD-10-CM | POA: Diagnosis not present

## 2021-12-22 DIAGNOSIS — D48118 Desmoid tumor of other site: Secondary | ICD-10-CM | POA: Diagnosis not present

## 2021-12-22 DIAGNOSIS — F410 Panic disorder [episodic paroxysmal anxiety] without agoraphobia: Secondary | ICD-10-CM | POA: Diagnosis not present

## 2021-12-22 DIAGNOSIS — D500 Iron deficiency anemia secondary to blood loss (chronic): Secondary | ICD-10-CM | POA: Diagnosis not present

## 2021-12-22 DIAGNOSIS — E43 Unspecified severe protein-calorie malnutrition: Secondary | ICD-10-CM | POA: Diagnosis not present

## 2021-12-22 MED ORDER — OXYCODONE 5 MG TABLET
ORAL_TABLET | ORAL | 0 refills | 9 days | PRN
Start: 2021-12-22 — End: 2022-01-21

## 2021-12-24 NOTE — Unmapped (Signed)
[facility]  CONSULT NOTE - INFLAMMATORY BOWEL DISEASE  12/27/2021    Demographics:  [name] [name] is a 21 y.o. year old female    Referring physician:   Jarold Motto, PAC  [address]  [place],  [name]    Patient Care Team:  Jarold Motto, [facility] as PCP - General  Debbe Mounts, MD as Consulting Physician (Peds: Pediatric Hematology-Oncology)  Cherlynn Perches, MD as Consulting Physician (Orthopedic Surgery)  Jobe Gibbon, MD as Consulting Physician (Interventional Radiology)  Twana First, MD as Consulting Physician  Tatton-Howard, Clelia Croft, RN as Registered Nurse (Oncology Navigator)  Lenise Arena Judye Bos, MD as Attending Provider (Surgical Oncology)  Clarene Duke, MD as Consulting Physician (Plastic Surgery)  Helyn Numbers, MD as Consulting Physician (Gastroenterology)  Childers, Biagio Quint, MD as Consulting Physician (Palliative Medicine)  Belva Bertin, RN as Registered Nurse (Palliative Medicine)  Lajuana Matte, RN as Registered Nurse (Oncology Navigator)  Sheral Apley, MD (Medical Oncology)  Lajuana Matte, RN as Registered Nurse (Oncology Navigator)  [name], MD as Fellow (Psychiatry)           HPI / NOTE :     Today, I saw [name] [name] for follow-up consultation in the [facility] at the request of Dr. Elenore Rota regarding pouch problems and rectal bleeding.  Outside records including available clinical notes, endoscopy reports, imaging results and pathology results were reviewed in detail as part of this follow-up consultation.       Chief complaint: Follow-up    HPI:    Patient just was discharged from hospital after prolonged hospital stay.  Multiple problems were addressed during this hospital stay.  Main problem in the moment is about 15 bowel movements daily.  In the past she has been on cefdinir which was not restarted yet    Additionally she is afraid of eating since every time she eats she develops abdominal pain.  She is not able to tolerate higher rates of her tube feeds than 50 mL/h and reports reflux during the night.    Pain therapy: on garbapentin (100 mg/100 mg/300 mg) and dilaudid (dilaudid 3 mg q6h scheduled and dilaudid 3 mg q3h PRN for breakthrough pain)      Anxiety/nausea : olanzapine (zyprexa), mirtazepin 7.5 mg,  and atarax     Desmoid therapy: On sorafenib    Nutrition: On tube feeds: Osmolite 1.5 @ 50 ml/hr over 16 hours at night. (Delivered by [facility])    Review of Systems: positive for [name]  Otherwise, the balance of 10 systems is negative.            Past Medical History:   Past medical history:   Past Medical History:   Diagnosis Date    Abdominal pain     Acid reflux     occas    Anesthesia complication     itching, shaking, coldness; last few surgeries have gone much better    Cataract of right eye     COVID-19 virus infection 01/2019    Cyst of thyroid determined by ultrasound     monitoring    [name]     2 right forearm, 1 left thigh, 1 right scapula, 1 under left clavicle; multiple    Difficult intravenous access     [name] (familial adenomatous polyposis)     [name]     Gastric polyps     History of  chemotherapy     last treatment approx 05/2019    History of colon polyps     History of COVID-19 01/2019    Iron deficiency anemia due to chronic blood loss     received iron infusion 11-2019    PONV (postoperative nausea and vomiting)     Rectal bleeding     Syncopal episodes     especially if becoming dehydrated     Past surgical history:   Past Surgical History:   Procedure Laterality Date    cyroablation      cystis removal      desmoid removal      PR CLOSE ENTEROSTOMY,RESEC+ANAST N/A 10/09/2020    Procedure: ILEOSTOMY TAKEDOWN;  Surgeon: Mickle Asper, MD;  Location: OR [place];  Service: General Surgery    PR COLONOSCOPY W/BIOPSY SINGLE/MULTIPLE N/A 10/27/2012    Procedure: COLONOSCOPY, FLEXIBLE, PROXIMAL TO SPLENIC FLEXURE; WITH BIOPSY, SINGLE OR MULTIPLE;  Surgeon: Shirlyn Goltz Mir, MD;  Location: PEDS PROCEDURE ROOM [facility];  Service: Gastroenterology    PR COLONOSCOPY W/BIOPSY SINGLE/MULTIPLE N/A 09/14/2013    Procedure: COLONOSCOPY, FLEXIBLE, PROXIMAL TO SPLENIC FLEXURE; WITH BIOPSY, SINGLE OR MULTIPLE;  Surgeon: Shirlyn Goltz Mir, MD;  Location: PEDS PROCEDURE ROOM [place];  Service: Gastroenterology    PR COLONOSCOPY W/BIOPSY SINGLE/MULTIPLE N/A 11/08/2014    Procedure: COLONOSCOPY, FLEXIBLE, PROXIMAL TO SPLENIC FLEXURE; WITH BIOPSY, SINGLE OR MULTIPLE;  Surgeon: Arnold Long Mir, MD;  Location: PEDS PROCEDURE ROOM [facility];  Service: Gastroenterology    PR COLONOSCOPY W/BIOPSY SINGLE/MULTIPLE N/A 12/26/2015    Procedure: COLONOSCOPY, FLEXIBLE, PROXIMAL TO SPLENIC FLEXURE; WITH BIOPSY, SINGLE OR MULTIPLE;  Surgeon: Arnold Long Mir, MD;  Location: PEDS PROCEDURE ROOM [facility];  Service: Gastroenterology    PR COLONOSCOPY W/BIOPSY SINGLE/MULTIPLE N/A 09/02/2017    Procedure: COLONOSCOPY, FLEXIBLE, PROXIMAL TO SPLENIC FLEXURE; WITH BIOPSY, SINGLE OR MULTIPLE;  Surgeon: Arnold Long Mir, MD;  Location: PEDS PROCEDURE ROOM [place];  Service: Gastroenterology    PR COLSC FLX W/REMOVAL LESION BY HOT BX FORCEPS N/A 08/27/2016    Procedure: COLONOSCOPY, FLEXIBLE, PROXIMAL TO SPLENIC FLEXURE; W/REMOVAL TUMOR/POLYP/OTHER LESION, HOT BX FORCEP/CAUTE;  Surgeon: Arnold Long Mir, MD;  Location: PEDS PROCEDURE ROOM [facility];  Service: Gastroenterology    PR COLSC FLX W/RMVL OF TUMOR POLYP LESION SNARE TQ N/A 02/25/2019    Procedure: COLONOSCOPY FLEX; W/REMOV TUMOR/LES BY SNARE;  Surgeon: Helyn Numbers, MD;  Location: GI PROCEDURES [place] [facility];  Service: Gastroenterology    PR COLSC FLX W/RMVL OF TUMOR POLYP LESION SNARE TQ N/A 03/13/2020    Procedure: COLONOSCOPY FLEX; W/REMOV TUMOR/LES BY SNARE;  Surgeon: Helyn Numbers, MD;  Location: [facility] [place] [facility];  Service: Gastroenterology    PR EXC SKIN BENIG 2.1-3 CM TRUNK,ARM,LEG Right 02/25/2017 Procedure: EXCISION, BENIGN LESION INCLUDE MARGINS, EXCEPT SKIN TAG, LEGS; EXCISED DIAMETER 2.1 TO 3.0 CM;  Surgeon: Clarene Duke, MD;  Location: CHILDRENS OR [facility];  Service: Plastics    PR EXC SKIN BENIG 3.1-4 CM TRUNK,ARM,LEG Right 02/25/2017    Procedure: EXCISION, BENIGN LESION INCLUDE MARGINS, EXCEPT SKIN TAG, ARMS; EXCISED DIAMETER 3.1 TO 4.0 CM;  Surgeon: Clarene Duke, MD;  Location: CHILDRENS OR [facility];  Service: Plastics    PR EXC SKIN BENIG >4 CM FACE,FACIAL Right 02/25/2017    Procedure: EXCISION, OTHER BENIGN LES INCLUD MARGIN, FACE/EARS/EYELIDS/NOSE/LIPS/MUCOUS MEMBRANE; EXCISED DIAM >4.0 CM;  Surgeon: Clarene Duke, MD;  Location: CHILDRENS OR [facility];  Service: Plastics    PR EXC TUMOR SOFT TISSUE LEG/ANKLE SUBQ 3+CM Right 08/05/2019  Procedure: EXCISION, TUMOR, SOFT TISSUE OF LEG OR ANKLE AREA, SUBCUTANEOUS; 3 CM OR GREATER;  Surgeon: Arsenio Katz, MD;  Location: MAIN OR [place];  Service: Plastics    PR EXC TUMOR SOFT TISSUE LEG/ANKLE SUBQ <3CM Right 08/05/2019    Procedure: EXCISION, TUMOR, SOFT TISSUE OF LEG OR ANKLE AREA, SUBCUTANEOUS; LESS THAN 3 CM;  Surgeon: Arsenio Katz, MD;  Location: MAIN OR [facility];  Service: Plastics    PR LAP, SURG PROCTECTOMY W J-POUCH N/A 08/10/2020    Procedure: ROBOTIC ASSISTED LAPAROSCOPIC PROCTOCOLECTOMY, ILEAL J POUCH, WITH OSTOMY;  Surgeon: Mickle Asper, MD;  Location: OR [place];  Service: General Surgery    PR NDSC EVAL INTSTINAL POUCH DX W/COLLJ SPEC SPX N/A 01/23/2021    Procedure: ENDO EVAL SM INTEST POUCH; DX;  Surgeon: Modena Nunnery, MD;  Location: [facility] [place] [facility];  Service: Gastroenterology    PR NDSC EVAL INTSTINAL POUCH DX W/COLLJ SPEC SPX N/A 08/27/2021    Procedure: ENDO EVAL SM INTEST POUCH; DX;  Surgeon: Hunt Oris, MD;  [facility]: [facility];  Service: Gastroenterology    PR NDSC EVAL INTSTINAL POUCH DX W/COLLJ SPEC SPX N/A 12/09/2021    Procedure: ENDO EVAL SM INTEST POUCH; DX; Surgeon: Vidal Schwalbe, MD;  [facility];  Service: Gastroenterology    PR UNLISTED PROCEDURE SMALL INTESTINE  01/23/2021    Procedure: UNLISTED PROCEDURE, SMALL INTESTINE;  Surgeon: Modena Nunnery, MD;  Location: [facility] [place] [facility];  Service: Gastroenterology    PR UPPER GI ENDOSCOPY,BIOPSY N/A 10/27/2012    Procedure: UGI ENDOSCOPY; WITH BIOPSY, SINGLE OR MULTIPLE;  Surgeon: Shirlyn Goltz Mir, MD;  Location: PEDS PROCEDURE ROOM [facility];  Service: Gastroenterology    PR UPPER GI ENDOSCOPY,BIOPSY N/A 09/14/2013    Procedure: UGI ENDOSCOPY; WITH BIOPSY, SINGLE OR MULTIPLE;  Surgeon: Shirlyn Goltz Mir, MD;  Location: PEDS PROCEDURE ROOM [facility];  Service: Gastroenterology    PR UPPER GI ENDOSCOPY,BIOPSY N/A 11/08/2014    Procedure: UGI ENDOSCOPY; WITH BIOPSY, SINGLE OR MULTIPLE;  Surgeon: Arnold Long Mir, MD;  Location: PEDS PROCEDURE ROOM [facility];  Service: Gastroenterology    PR UPPER GI ENDOSCOPY,BIOPSY N/A 12/26/2015    Procedure: UGI ENDOSCOPY; WITH BIOPSY, SINGLE OR MULTIPLE;  Surgeon: Arnold Long Mir, MD;  Location: PEDS PROCEDURE ROOM [facility];  Service: Gastroenterology    PR UPPER GI ENDOSCOPY,BIOPSY N/A 08/27/2016    Procedure: UGI ENDOSCOPY; WITH BIOPSY, SINGLE OR MULTIPLE;  Surgeon: Arnold Long Mir, MD;  Location: PEDS PROCEDURE ROOM [facility];  Service: Gastroenterology    PR UPPER GI ENDOSCOPY,BIOPSY N/A 09/02/2017    Procedure: UGI ENDOSCOPY; WITH BIOPSY, SINGLE OR MULTIPLE;  Surgeon: Arnold Long Mir, MD;  Location: PEDS PROCEDURE ROOM [facility];  Service: Gastroenterology    PR UPPER GI ENDOSCOPY,BIOPSY N/A 03/13/2020    Procedure: UGI ENDOSCOPY; WITH BIOPSY, SINGLE OR MULTIPLE;  Surgeon: Helyn Numbers, MD;  Location: [facility] PROCEDURES [place] [facility];  Service: Gastroenterology    PR UPPER GI ENDOSCOPY,BIOPSY N/A 09/05/2021    Procedure: UGI ENDOSCOPY; WITH BIOPSY, SINGLE OR MULTIPLE;  Surgeon: Wendall Papa, MD;  [facility]: [facility];  Service: Gastroenterology    TUMOR REMOVAL      multiple-head, neck, back, hand, right flank, multiple     Family history:   Family History   Problem Relation Age of Onset    No Known Problems Mother     No Known Problems Father     No Known Problems Sister  No Known Problems Brother     Stroke Maternal Grandmother     Other Maternal Grandmother         benign lesions of liver and pancreas, further details unknown    Cancer Maternal Grandmother     Diabetes Maternal Grandmother     Hypertension Maternal Grandmother     Thyroid disease Maternal Grandmother     Arthritis Maternal Grandfather     Asthma Maternal Grandfather     COPD Paternal Grandmother         Deceased    Miscarriages / Stillbirths Paternal Grandmother     Alcohol abuse Paternal Grandfather         Deceased    No Known Problems Maternal Aunt     No Known Problems Maternal Uncle     No Known Problems Paternal Aunt     No Known Problems Paternal Uncle     Anesthesia problems Neg Hx     Broken bones Neg Hx     Cancer Neg Hx     Clotting disorder Neg Hx     Collagen disease Neg Hx     Diabetes Neg Hx     Dislocations Neg Hx     Fibromyalgia Neg Hx     Gout Neg Hx     Hemophilia Neg Hx     Osteoporosis Neg Hx     Rheumatologic disease Neg Hx     Scoliosis Neg Hx     Severe sprains Neg Hx     Sickle cell anemia Neg Hx     Spinal Compression Fracture Neg Hx     Melanoma Neg Hx     Basal cell carcinoma Neg Hx     Squamous cell carcinoma Neg Hx      Social history:   Social History     Socioeconomic History    Marital status: Single     Spouse name: None    Number of children: None    Years of education: None    Highest education level: None   Tobacco Use    Smoking status: Never     Passive exposure: Past    Smokeless tobacco: Never   Vaping Use    Vaping Use: Never used   Substance and Sexual Activity    Alcohol use: Never    Drug use: Never    Sexual activity: Never   Other Topics Concern    Do you use sunscreen? Yes    Tanning bed use? No    Are you easily burned? No    Excessive sun exposure? No    Blistering sunburns? 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     Social Determinants of Health     Financial Resource Strain: Low Risk  (08/30/2021)    Overall Financial Resource Strain (CARDIA)     Difficulty of Paying Living Expenses: Not very hard   Food Insecurity: No Food Insecurity (08/30/2021)    Hunger Vital Sign     Worried About Running Out of Food in the Last Year: Never true     Ran Out of Food in the Last Year: Never true   Transportation Needs: No Transportation Needs (08/30/2021)    PRAPARE - Therapist, art (Medical): No     Lack of Transportation (Non-Medical): No  Allergies:     Allergies   Allergen Reactions    Adhesive Tape-Silicones Hives and Rash     Paper tape  And tegederm ok    Ferrlecit [Sodium Ferric Gluconat-Sucrose] Swelling and Rash    Methylnaltrexone      Per Patient: I lost bowel control, severe abdominal cramping, and elevated BP    Neomycin Swelling     Rxn after ear drops; ear swelling    Papaya Hives    Morphine Nausea And Vomiting    Zosyn [Piperacillin-Tazobactam] Itching and Rash     Red and itchy    Compazine [Prochlorperazine] Other (See Comments)     Extreme agitation    Latex, Natural Rubber Rash    Opioids - Morphine Analogues Itching and Rash     Just morphine other opioids are fine             Medications:     Current Outpatient Medications   Medication Sig Dispense Refill    acetaminophen (TYLENOL) 500 MG tablet Take 2 tablets (1,000 mg total) by mouth Two (2) times a day.      baclofen (LIORESAL) 5 mg Tab tablet Take 1-2 tablets (5-10 mg total) by mouth two (2) times a day as needed for muscle spasms. 40 tablet 0    cefdinir (OMNICEF) 300 MG capsule Take 1 capsule (300 mg total) by mouth Two (2) times a day. 180 capsule 1    COURIERED MED OR SUPPLY [id number], SORAFENIB 200MG  TABLET 1 each 0 diclofenac sodium (VOLTAREN) 1 % gel Apply 2 g topically four (4) times a day. 100 g 0    famotidine (PEPCID) 20 MG tablet Take 1 tablet (20 mg total) by mouth nightly. 90 tablet 3    gabapentin (NEURONTIN) 100 MG capsule Take 100mg  in the morning and afternoon. Take 300 mg at night. 120 capsule 3    hydrocortisone 2.5 % cream Apply 1 application. topically daily as needed.      HYDROmorphone (DILAUDID) 2 MG tablet Take 1.5 tablets (3 mg total) by mouth every six (6) hours for 14 days. 84 tablet 0    HYDROmorphone (DILAUDID) 2 MG tablet Take 1.5 tablets (3 mg total) by mouth every three (3) hours as needed for moderate pain or severe pain. 120 tablet 0    hydrOXYzine (ATARAX) 25 MG tablet Take 1 tablet (25 mg total) by mouth every six (6) hours as needed for itching or anxiety (also insomnia). 20 tablet 0    lidocaine (LIDODERM) 5 % patch Place 1 patch on the skin daily. Apply to affected area for 12 hours only each day (then remove patch)      menthol-zinc oxide (CALMOSEPTINE) 0.44-20.6 % Oint Apply topically four (4) times a day as needed. 113 g 11    mirtazapine (REMERON) 7.5 MG tablet Take 1 tablet (7.5 mg total) by mouth nightly. 90 tablet 3    naloxone (NARCAN) 4 mg nasal spray One spray in either nostril once for known/suspected opioid overdose. May repeat every 2-3 minutes in alternating nostril til EMS arrives 2 each 0    OLANZapine (ZYPREXA) 2.5 MG tablet Take 1 tablet (2.5 mg total) by mouth nightly. 30 tablet 0    ondansetron (ZOFRAN-ODT) 8 MG disintegrating tablet Take 1 tablet (8 mg total) by mouth every twelve (12) hours as needed for nausea.      SORAfenib (NEXAVAR) 200 mg tablet Take 2 tablets (400 mg total) by mouth daily. 60 tablet 2    tacrolimus (PROTOPIC)  0.1 % ointment Apply 1 application. topically Two (2) times a day. Apply two times a day to the face 100 g 0    triamcinolone (KENALOG) 0.025 % ointment Apply two times a day as needed to the body for itchy skin or rash. 30 g 0    zinc oxide 10 % Crea Apply 1 application. topically daily as needed.       No current facility-administered medications for this visit.             Physical Exam:   BP 112/73  - Pulse 64  - Temp 36.7 ??C (98.1 ??F)  - Ht 165.1 cm (5' 5)  - Wt 52.2 kg (115 lb)  - BMI 19.14 kg/m??     GEN: no apparent distress, appears comfortable on exam  HEENT: mucous membranes moist, facial plethora; negative Pemberton's Sign   NEURO:  gait normal, non-focal, no obvious neurologic abnormality  NECK: Supple, no lymphadenopathy  LUNGS: CTAB, no wheezes, rales, or rhonchi  CV: S1/S2, RRR, no murmurs  ABD: Soft, palpable bulging in LLQ, mildly tender  Extremities: no cyanosis, clubbing or edema, normal gait  Psych: affect appropriate, A&O x3  SKIN: no visible lesions on face, neck, arms, abdomen  PERIANAL / RECTAL EXAM:   Deferred           Labs, Data & Indices:     Lab Review:   Admission on 12/05/2021, Discharged on 12/20/2021   Component Date Value Ref Range Status    Sodium 12/04/2021 138  135 - 145 mmol/L Final    Potassium 12/04/2021 3.6  3.4 - 4.8 mmol/L Final    Chloride 12/04/2021 106  98 - 107 mmol/L Final    CO2 12/04/2021 25.0  20.0 - 31.0 mmol/L Final    Anion Gap 12/04/2021 7  5 - 14 mmol/L Final    BUN 12/04/2021 6 (L)  9 - 23 mg/dL Final    Creatinine 16/12/9602 0.59 (L)  0.60 - 0.80 mg/dL Final    BUN/Creatinine Ratio 12/04/2021 10   Final    eGFR CKD-EPI (2021) Female 12/04/2021 >90  >=60 mL/min/1.24m2 Final    Glucose 12/04/2021 90  70 - 179 mg/dL Final    Calcium 54/11/8117 9.4  8.7 - 10.4 mg/dL Final    Albumin 14/78/2956 4.5  3.4 - 5.0 g/dL Final    Total Protein 12/04/2021 7.2  5.7 - 8.2 g/dL Final    Total Bilirubin 12/04/2021 0.6  0.3 - 1.2 mg/dL Final    Bilirubin, Direct 12/04/2021 0.20  0.00 - 0.30 mg/dL Final    AST 21/30/8657 17  <=34 U/L Final    ALT 12/04/2021 <7 (L)  10 - 49 U/L Final    Alkaline Phosphatase 12/04/2021 51  46 - 116 U/L Final    Lipase 12/04/2021 32  12 - 53 U/L Final    Color, UA 12/04/2021 Light Yellow   Final    Clarity, UA 12/04/2021 Clear   Final    Specific Gravity, UA 12/04/2021 1.014  1.003 - 1.030 Final    pH, UA 12/04/2021 6.0  5.0 - 9.0 Final    Leukocyte Esterase, UA 12/04/2021 Trace (A)  Negative Final    Nitrite, UA 12/04/2021 Negative  Negative Final    Protein, UA 12/04/2021 Negative  Negative Final    Glucose, UA 12/04/2021 Negative  Negative Final    Ketones, UA 12/04/2021 Negative  Negative Final    Urobilinogen, UA 12/04/2021 <2.0 mg/dL  <8.4 mg/dL Final  Bilirubin, UA 12/04/2021 Negative  Negative Final    Blood, UA 12/04/2021 Negative  Negative Final    RBC, UA 12/04/2021 1  <=4 /HPF Final    WBC, UA 12/04/2021 1  0 - 5 /HPF Final    Squam Epithel, UA 12/04/2021 3  0 - 5 /HPF Final    Bacteria, UA 12/04/2021 None Seen  None Seen /HPF Final    Mucus, UA 12/04/2021 Rare (A)  None Seen /HPF Final    Amphetamines Screen, Ur 12/04/2021 Negative  <500 ng/mL Final    Barbiturates Screen, Ur 12/04/2021 Negative  <200 ng/mL Final    Benzodiazepines Screen, Urine 12/04/2021 Negative  <200 ng/mL Final    Cannabinoids Screen, Ur 12/04/2021 Negative  <20 ng/mL Final    Methadone Screen, Urine 12/04/2021 Negative  <300 ng/mL Final    Cocaine(Metab.)Screen, Urine 12/04/2021 Negative  <150 ng/mL Final    Opiates Screen, Ur 12/04/2021 Positive (A)  <300 ng/mL Final    Fentanyl Screen, Ur 12/04/2021 Negative  <1.0 ng/mL Final    Oxycodone Screen, Ur 12/04/2021 Positive (A)  <100 ng/mL Final    Buprenorphine, Urine 12/04/2021 Negative  <5 ng/mL Final    hCG Quantitative 12/04/2021 <2.6  [name]/mL Final    WBC 12/04/2021 11.0  3.6 - 11.2 10*9/L Final    RBC 12/04/2021 5.05  3.95 - 5.13 10*12/L Final    HGB 12/04/2021 15.3 (H)  11.3 - 14.9 g/dL Final    HCT 16/12/9602 44.2 (H)  34.0 - 44.0 % Final    MCV 12/04/2021 87.5  77.6 - 95.7 fL Final    MCH 12/04/2021 30.3  25.9 - 32.4 pg Final    MCHC 12/04/2021 34.7  32.0 - 36.0 g/dL Final    RDW 54/11/8117 12.3  12.2 - 15.2 % Final    MPV 12/04/2021 10.1  6.8 - 10.7 fL Final    Platelet 12/04/2021 228  150 - 450 10*9/L Final    Neutrophils % 12/04/2021 69.1  % Final    Lymphocytes % 12/04/2021 22.8  % Final    Monocytes % 12/04/2021 7.1  % Final    Eosinophils % 12/04/2021 0.7  % Final    Basophils % 12/04/2021 0.3  % Final    Absolute Neutrophils 12/04/2021 7.6  1.8 - 7.8 10*9/L Final    Absolute Lymphocytes 12/04/2021 2.5  1.1 - 3.6 10*9/L Final    Absolute Monocytes 12/04/2021 0.8  0.3 - 0.8 10*9/L Final    Absolute Eosinophils 12/04/2021 0.1  0.0 - 0.5 10*9/L Final    Absolute Basophils 12/04/2021 0.0  0.0 - 0.1 10*9/L Final    Urine Culture, Comprehensive 12/04/2021 10,000 to 50,000 CFU/mL Pseudomonas aeruginosa (A)   Final    Urine Culture, Comprehensive 12/04/2021 10,000 to 50,000 CFU/mL Mixed Urogenital Flora (A)   Final    Lactate, Venous 12/05/2021 0.8  0.5 - 1.8 mmol/L Final    Giardia 12/07/2021 Negative  Negative Final    Cryptosporidium 12/07/2021 Negative  Negative Final    E. coli: O157 12/07/2021 Negative  Negative Final    E. coli: Enterotoxigenic (ETEC) 12/07/2021 Negative  Negative Final    E. coli: Shiga-toxin (STEC) 12/07/2021 Negative  Negative Final    Salmonella 12/07/2021 Negative  Negative Final    Shigella 12/07/2021 Negative  Negative Final    Campylobacter 12/07/2021 Negative  Negative Final    Norovirus 12/07/2021 Negative  Negative Final    Rotavirus 12/07/2021 Negative  Negative Final    Vitamin B-12 12/04/2021  362  211 - 911 pg/ml Final    Sodium 12/09/2021 142  135 - 145 mmol/L Final    Potassium 12/09/2021 4.1  3.4 - 4.8 mmol/L Final    Chloride 12/09/2021 109 (H)  98 - 107 mmol/L Final    CO2 12/09/2021 26.0  20.0 - 31.0 mmol/L Final    Anion Gap 12/09/2021 7  5 - 14 mmol/L Final    BUN 12/09/2021 <5 (L)  9 - 23 mg/dL Final    Creatinine 04/54/0981 0.54 (L)  0.60 - 0.80 mg/dL Final    eGFR CKD-EPI (2021) Female 12/09/2021 >90  >=60 mL/min/1.29m2 Final    Glucose 12/09/2021 93  70 - 179 mg/dL Final    Calcium 19/14/7829 9.2  8.7 - 10.4 mg/dL Final    Albumin 56/21/3086 3.4  3.4 - 5.0 g/dL Final    Total Protein 12/09/2021 5.7  5.7 - 8.2 g/dL Final    Total Bilirubin 12/09/2021 0.4  0.3 - 1.2 mg/dL Final    AST 57/84/6962 27  <=34 U/L Final    ALT 12/09/2021 81 (H)  10 - 49 U/L Final    Alkaline Phosphatase 12/09/2021 85  46 - 116 U/L Final    WBC 12/09/2021 5.3  3.6 - 11.2 10*9/L Final    RBC 12/09/2021 4.40  3.95 - 5.13 10*12/L Final    HGB 12/09/2021 12.9  11.3 - 14.9 g/dL Final    HCT 95/28/4132 38.8  34.0 - 44.0 % Final    MCV 12/09/2021 88.2  77.6 - 95.7 fL Final    MCH 12/09/2021 29.4  25.9 - 32.4 pg Final    MCHC 12/09/2021 33.4  32.0 - 36.0 g/dL Final    RDW 44/03/270 12.2  12.2 - 15.2 % Final    MPV 12/09/2021 9.9  6.8 - 10.7 fL Final    Platelet 12/09/2021 187  150 - 450 10*9/L Final    Prealbumin 12/09/2021 13.9  10.0 - 40.0 mg/dL Final    EKG Ventricular Rate 12/09/2021 83  BPM Final    EKG Atrial Rate 12/09/2021 83  BPM Final    EKG P-R Interval 12/09/2021 128  ms Final    EKG QRS Duration 12/09/2021 82  ms Final    EKG Q-T Interval 12/09/2021 362  ms Final    EKG QTC Calculation 12/09/2021 425  ms Final    EKG Calculated P Axis 12/09/2021 59  degrees Final    EKG Calculated R Axis 12/09/2021 60  degrees Final    EKG Calculated T Axis 12/09/2021 35  degrees Final    QTC Fredericia 12/09/2021 403  ms Final    Sodium 12/11/2021 141  135 - 145 mmol/L Final    Potassium 12/11/2021 3.8  3.4 - 4.8 mmol/L Final    Chloride 12/11/2021 108 (H)  98 - 107 mmol/L Final    CO2 12/11/2021 24.0  20.0 - 31.0 mmol/L Final    Anion Gap 12/11/2021 9  5 - 14 mmol/L Final    BUN 12/11/2021 5 (L)  9 - 23 mg/dL Final    Creatinine 53/66/4403 0.59 (L)  0.60 - 0.80 mg/dL Final    BUN/Creatinine Ratio 12/11/2021 8   Final    eGFR CKD-EPI (2021) Female 12/11/2021 >90  >=60 mL/min/1.1m2 Final    Glucose 12/11/2021 96  70 - 179 mg/dL Final    Calcium 47/42/5956 9.2  8.7 - 10.4 mg/dL Final    Free T4 38/75/6433 0.79 (L)  0.89 - 1.76 ng/dL Final  TTG Interpretation 12/11/2021 Negative  Negative Final    Tissue Transglutaminase Antibody, * 12/11/2021 <0.2  <7 U/mL Final    TSH 12/11/2021 1.409  0.550 - 4.780 uIU/mL Final    WBC 12/19/2021 6.5  3.6 - 11.2 10*9/L Final    RBC 12/19/2021 4.57  3.95 - 5.13 10*12/L Final    HGB 12/19/2021 13.4  11.3 - 14.9 g/dL Final    HCT 16/12/9602 39.2  34.0 - 44.0 % Final    MCV 12/19/2021 85.9  77.6 - 95.7 fL Final    MCH 12/19/2021 29.3  25.9 - 32.4 pg Final    MCHC 12/19/2021 34.1  32.0 - 36.0 g/dL Final    RDW 54/11/8117 11.8 (L)  12.2 - 15.2 % Final    MPV 12/19/2021 9.8  6.8 - 10.7 fL Final    Platelet 12/19/2021 191  150 - 450 10*9/L Final    Sodium 12/19/2021 141  135 - 145 mmol/L Final    Potassium 12/19/2021 4.1  3.4 - 4.8 mmol/L Final    Chloride 12/19/2021 110 (H)  98 - 107 mmol/L Final    CO2 12/19/2021 23.0  20.0 - 31.0 mmol/L Final    Anion Gap 12/19/2021 8  5 - 14 mmol/L Final    BUN 12/19/2021 12  9 - 23 mg/dL Final    Creatinine 14/78/2956 0.50 (L)  0.60 - 0.80 mg/dL Final    BUN/Creatinine Ratio 12/19/2021 24   Final    eGFR CKD-EPI (2021) Female 12/19/2021 >90  >=60 mL/min/1.43m2 Final    Glucose 12/19/2021 89  70 - 179 mg/dL Final    Calcium 21/30/8657 8.9  8.7 - 10.4 mg/dL Final    Magnesium 84/69/6295 2.0  1.6 - 2.6 mg/dL Final    Phosphorus 28/41/3244 4.4  2.4 - 5.1 mg/dL Final    EKG Ventricular Rate 12/20/2021 82  BPM Final    EKG Atrial Rate 12/20/2021 82  BPM Final    EKG P-R Interval 12/20/2021 126  ms Final    EKG QRS Duration 12/20/2021 82  ms Final    EKG Q-T Interval 12/20/2021 390  ms Final    EKG QTC Calculation 12/20/2021 455  ms Final    EKG Calculated P Axis 12/20/2021 59  degrees Final    EKG Calculated R Axis 12/20/2021 53  degrees Final    EKG Calculated T Axis 12/20/2021 53  degrees Final    QTC Fredericia 12/20/2021 432  ms Final     ............................................................................................................................................. HISTORY:     Brief Disease Course:  08/2020 colectomy for [name] 8/022 ileostomy takedown, complicated by postop ileus and inabailitry to eat during stage 1 and 2 (need for TPN)    Other Paraspinal and anterior chest desmoid tumors    Colectomy specimen:  Terminal ileum, appendix and colon, total colectomy:  -Tubular adenomas (at least 100), extending from the ileocecal valve to the rectum, with the majority located in the rectum. The adenomas range from 0.2 cm to 0.7 cm in greatest dimension.  -There is no evidence of high-grade dysplasia or malignancy.  -Mucosal margins are negative for dysplasia (5.2 cm from proximal, 0.1 cm from distal).  -The appendix and ileum are not involved.      Endoscopy:      Upper endoscopy 08/2021     Normal esophagus.                         - Multiple gastric polyps. Biopsied.                         -  Normal stomach otherwise. Biopsied.                         - Normal examined duodenum. Biopsied.    A: Duodenum, biopsy  - Adenomatous polyp (1 fragment)  - No high grade dysplasia or carcinoma identified  - Separate fragments of duodenal mucosa with mild-moderate villous blunting  - No increased intraepithelial lymphocytes identified     B: Stomach, biopsy  - Gastric fundic mucosa with chronic superficial gastritis  - Gastric antral mucosa with erosive minimally active chronic superficial gastritis and slight reactive foveolar hyperplasia  - No Helicobacter pylori identified on H&E stain  - No intestinal metaplasia or dysplasia identified     C: Stomach, polypectomy  - Polypoid fragments of gastric fundic mucosa with features suggestive of early fundic gland polyps  - No intestinal metaplasia or dysplasia identified      Pouchoscopy 08/2021   The perianal and digital rectal examinations were normal.       The j-pouch, pouch inlet and rectal cuff appeared normal.       The exam was otherwise without abnormality, including no evidence of        mass of intussusception. Pouchoscopy 01/23/2021  Impression:            - Preparation of the colon was fair.                         - The examined portion of the ileum was normal.                         - The ileoanal pouch is normal.                         - Rectal cuff with congestion and an intact staple                          line seen.                         - No specimens collected.    - Hegar dilation 17 - 20 mm    Last upper and lower endoscopy 03/2020  Upper    - Normal esophagus.                         - Multiple gastric polyps. Fundic gland polyps                         - Normal mucosa was found in the antrum. Biopsied.                         - Normal examined duodenum    Colon  - Six 6 to 10 mm polyps in the rectum, in the sigmoid                          colon, in the descending colon and in the transverse                          colon, removed with a cold snare. Resected and  retrieved.                         - 300 polyps in the entire colon.    A: Stomach, biopsy  - Gastric fundic mucosa with chronic superficial gastritis  - Gastric antral mucosa with chronic superficial gastritis and reactive foveolar hyperplasia  - No Helicobacter pylori identified on H&E stain  - No intestinal metaplasia or dysplasia identified     B: Colon, transverse, biopsy  - Adenomatous polyp (multiple fragments)  - No high grade dysplasia or carcinoma identified    Imaging:     US Liver doppler 04/23/2021   IMPRESSION:   1.Patent hepatic vasculature with normal flow direction.   2.Mildly echogenic liver, which is nonspecific, but may be seen in fibrofatty changes.       Soluble contrast enema 09/19/2020     Mild narrowing at the ileal pouch-anal anastomosis which likely represents mild post-operative edema. Small linear focus of contrast at the level of the anastomosis is favored to be intraluminal over a tiny contained leak given that it spontaneously resolves.     CT abdomen and pelvis August 26, 2020     1. Unremarkable appearance to the small bowel. J pouch is unremarkable. Diverting loop ileostomy is unremarkable.      CT abdomen and pelvis August 18, 2020     IMPRESSION:  Dilated loops of ileum distal to the loop ileostomy with associated wall thickening.  Findings are suggestive of nonspecific ileitis, infectious or inflammatory.  No drainable fluid collections.  No evidence of obstruction    CT abdomen and pelvis w contrast 02/15/2021    IMPRESSION:   Prior colectomy. Mid to distal small bowel loops are prominent with   air-fluid levels. Difficult to completely exclude distal partial   small bowel obstruction.     Small amount of free fluid in the abdomen and pelvis.       CT abdomen and pelvis with contrast 02/22/2021  IMPRESSION:   There is no evidence of intestinal obstruction or pneumoperitoneum.   There is no hydronephrosis.     Previous subtotal colectomy. Dilation of distal ileum seen in the   previous examination could not be identified in the current study.   Oral contrast has reached the rectum.     Possible small hepatic cyst. There is 10 mm smooth marginated   low-density lesion in the left kidney with density measurements   higher than usual for simple cyst. Follow-up renal sonogram in the   outpatient setting should be considered to re-evaluate this finding.               Assessment & Recommendations:     [name] [name] is a 21 y.o. female who presents for follow up for GI issues after pouch done for [name].       Abdominal pain: This probably due to combination of dysmotility, visceral hypersensitivity and anxiety.  The pain appears to be out of proportion to the objective findings.  However it is possible that the desmoid tumors compress mesenteric nerves leading to the intense pain. A  celiac block was discussed in the past and I think given the limited therapeutic options and the high dose of narcotics the patient is currently on it is worth a trial to perform this block. Additionally the high doses of narcotics lead to more dysmotility and we need to consider Movantic in the future    Anxiety/nausea : Anxiety probably drives  and nausea.  Currently it seems to be better controlled with the regimen of olanzapine (zyprexa), mirtazepin 7.5 mg,  and atarax     Desmoid therapy: Shrink the tumors she was just started on on sorafenib    Nutrition: On tube feeds: Osmolite 1.5 @ 50 ml/hr over 16 hours at night.  Currently she is not able to tolerate higher rate however plan was to advance it to 55 or 65 mL/h.  I discussed that she can apply the tube feeds it during the day or during the night.  During the day that is the advantage that she is able to move around.  She reports that she has a feeling of vomiting sometimes in the night during tube feeds and during the day in upright position it may be easier tolerated.    Overall the nasogastric tube can not be long-term concept.  I encouraged her to try to start eating again.  I hope we can break the cycle of anxiety, abdominal pain and eating.  If this is not possible one has to consider a gastric  PEG tube      Fecal incontinence: Currently no complaint however given the high bowel frequency every restart the patient on cefdinir      Portions of this record have been created using Dragon dictation software. Dictation errors have been sought, but may not have been identified and corrected.     Decision-making today was high complexity on the basis of discussion of [name]/desmoid tumors/pouchitis, chronic abdominal pain, and reviewing imaging/colonoscopy with future next steps.          PLAN:            Patient Instructions   Restart cefdinir twice daily    Support trial of celiac nerve block    Continue tube feeds, need to consider gastric percutaneous tube     Continue tube feeds at 50 ml, try to eat during the day    Need to consider Movantik in the future for small bowel motility

## 2021-12-26 ENCOUNTER — Ambulatory Visit
Admit: 2021-12-26 | Discharge: 2021-12-27 | Payer: PRIVATE HEALTH INSURANCE | Attending: Gastroenterology | Primary: Gastroenterology

## 2021-12-26 DIAGNOSIS — Z79899 Other long term (current) drug therapy: Secondary | ICD-10-CM | POA: Diagnosis not present

## 2021-12-26 DIAGNOSIS — F419 Anxiety disorder, unspecified: Secondary | ICD-10-CM | POA: Diagnosis not present

## 2021-12-26 DIAGNOSIS — R109 Unspecified abdominal pain: Secondary | ICD-10-CM | POA: Diagnosis not present

## 2021-12-26 DIAGNOSIS — K625 Hemorrhage of anus and rectum: Secondary | ICD-10-CM | POA: Diagnosis not present

## 2021-12-26 DIAGNOSIS — Z9221 Personal history of antineoplastic chemotherapy: Secondary | ICD-10-CM | POA: Diagnosis not present

## 2021-12-26 DIAGNOSIS — Z8616 Personal history of COVID-19: Secondary | ICD-10-CM | POA: Diagnosis not present

## 2021-12-26 DIAGNOSIS — R110 Nausea: Secondary | ICD-10-CM | POA: Diagnosis not present

## 2021-12-26 DIAGNOSIS — D1391 Familial adenomatous polyposis: Secondary | ICD-10-CM | POA: Diagnosis not present

## 2021-12-26 DIAGNOSIS — K91850 Pouchitis: Secondary | ICD-10-CM | POA: Diagnosis not present

## 2021-12-26 DIAGNOSIS — Z23 Encounter for immunization: Secondary | ICD-10-CM | POA: Diagnosis not present

## 2021-12-26 DIAGNOSIS — Z931 Gastrostomy status: Secondary | ICD-10-CM | POA: Diagnosis not present

## 2021-12-26 DIAGNOSIS — D48111 Desmoid tumor of chest wall: Secondary | ICD-10-CM | POA: Diagnosis not present

## 2021-12-26 DIAGNOSIS — R197 Diarrhea, unspecified: Secondary | ICD-10-CM | POA: Diagnosis not present

## 2021-12-26 DIAGNOSIS — D48113 Desmoid tumor of abdominal wall: Secondary | ICD-10-CM | POA: Diagnosis not present

## 2021-12-26 NOTE — Unmapped (Addendum)
Restart cefdinir twice daily    Support trial of celiac nerve block    Continue tube feeds, need to consider gastric percutaneous tube     Continue tube feeds at 50 ml, try to eat during the day    Need to consider Movantik in the future for small bowel motility

## 2021-12-27 NOTE — Unmapped (Signed)
Pt's mom called to inquire about an earlier appt for patient with [name] Pilar, PA. She missed an appt because she was hospitalized. Pt is scheduled in Feb 2024. I have added her to the wait list for any openings that become available.

## 2021-12-30 NOTE — Unmapped (Signed)
[facility] BONE AND SOFT TISSUE ONCOLOGY RETURN VISIT    Encounter Date: 12/31/2021  Patient Name: [name] [name]  Medical Record Number: [id number]    Referring Physician: Sheral Apley, MD  [address]  [facility]  [place],  [place] [address]    Primary Care Provider: Jarold [name], [facility]    DIAGNOSIS:  1. [name]    2. [name]    3. Acute deep vein thrombosis (DVT) of axillary vein of right upper extremity (CMS-HCC)    4. Chemotherapy-induced nausea    5. Syncope, unspecified syncope type    6. Iron deficiency anemia due to chronic blood loss    7. History of colectomy    8. Severe protein-calorie malnutrition (CMS-HCC)    9. 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    Desmoid fibromatosis, history of [name]     She has had numerous sites of disease, including paraspinal, chest wall, right arm/wrist. She has had several cryoablations with Dr. Cathie [name] which have seemed to offer clinical benefit. She trialed sorafenib which seemed to lead to shrinkage of lesions, but she had significant intolerance with dizziness, presyncope, fatigue, hair loss, rash, GI side effects.      We have discussed at length the nature of desmoid fibromatosis tumors, the fact they can wax and wane independent of therapy, are not cancers in a metastatic potential, but can be locally infiltrative and cause substantial morbidity to the site of disease and occasionally mortality. I reviewed that the emerging standard of care is observation based on the fact that 20-30% will experience spontaneous regression in the year following diagnosis. Given her long experience with desmoids, complete regression may be less likely, but we will still approach this cautiously with parallel goals of avoidance of overtreatment (particularly surgical) and palliation of symptoms / preservation of function.     Current lesions are in the away from her medical team. Would not rule out trying this again in the future with optimized supportive care  - Alternatively, pazopanib would be a reasonable option, per DESMOPAZ (82% nonprogression at 6 mos, similar RR to sorafenib)  - clinical trials, novel or off-label therapies may be reasonable if available: potential agents include DFMO, gamma secretase inhibitors, NOTCH pathway inhibitors    11/09/20: MRI without evidence of recurrent desmoids, stability of prior lesions.  02/14/21: Biopsy of thigh muscle thickening - benign, hypocellular fibrous tissue. Favor Gardner fibroma - on the desmoid spectrum but less cellular and less aggressive   02/14/21: Percutaneous steroid injection of forearm lesions.    03/19/21: Lots of active issues, although desmoids are not one of them. On TPN for diarrhea, vomiting, intolerance of food, significant malnutrition. Having to withdraw partially from school given medical issues.     06/21/21: Has had a complex few months, on TPN, significant fluid overload, caused worsening desmoid pain. Now off TPN, nutrition slowly improving, tumor pain better. Scans show stable disease, although final read still pending. Continue active surveillance - next scans in 6 mos and would include thigh in next set of scans.    ADDENDUM: Final MRI read demonstrates 2 ill defined soft tissue nodules, personally reviewed and discussed with radiology. Concerning for recurrent desmoid. Will obtain December CT scans for interpretation to evaluate if these are truly new or if they were present at that time. Would start with surveillance, given risks of  systemic therapy after recent significant GI and medical issues. Repeat MRI in 2 mos instead of originally planned 6 mos. Called patient and reviewed findings, she was in agreement with the plan.    09/03/21: Scans show stable disease. Was admitted for abdominal pain, concern for intussusception, N/V, but we discussed that these may not be related to her desmoids. Opted for continued surveillance.    12/03/21: Scans show slow growth when reflecting over the past several months. Importantly, she reports substantially worsening abdominal pain which is centered on her desmoids, poor appetite, weight loss, possible transient obstructive symptoms. Long conversation about options and pros and cons of each. She would like to trial sorafenib again, acknowledging the risks. She will meet with GI, her psychiatry team (she has had challenges with duloxetine), pain team, and may reconnect with Dr. Selena [name] in the interim to discuss other supportive care measures and procedural options.     9/28-10/13/23: Hospitalization for abdominal pain, malnutrition. Pouchoscopy 10/4 without stricture. Initiated on tube feeds. Sorafenib was initiated ~ 10/3 and tolerated reasonably well in house.    12/31/21: Tox check. ***     Anxiety  AYA Needs  Patient is 21 and in the past year has had the collision of numerous medical challenges which have pushed her to see her health and future medical care more clearly. Trying to balance all of her medical issues this with future goals of career, nursing, college. Significant burden of anxiety, maybe depression as well. Was reportedly diagnosed with medical trauma by the counseling office at her university. Also trying to establish her own autonomy after a medicalized childhood  - connected with AYA team, [name] [name]. May need input from [name] or others at some point  - seen by [facility]YA palliative care sarcoma collaborative, Dr. Mickie [name], will assist with pain management and symptom control, as well as grappling with current issues relating to cancer, AYA, development     Tumor pain: seen by AYA sarcoma palliative care, Dr. Mickie [name]. Overall tumor pain is relatively well-controlled. Primary pain triggers today include PICC line, tunneled line, and persistent abdominal pain.  -defer to pain team, but cymbalta may not be an effective agent well as pain team - [name] has been working closely with her  -defer to pain team     Contraception: Not reviewed today, will need to confirm prior to initiation of TKI  -placed fertility referral today due to her interest in discussing options with them, especially prior to possible use of nirogacestat which has a risk of amenorrhea / ovarian dysfunction    Line Associated DVT:   Duplex demonstrates DVT shortly after line placement. Treated with Apixaban 5mg  BID x 3 mos, now completed    Plan:  -resume hycosamine which was helpful for cramping / diarrhea  -add immodium if persistent diarrhea  -continue pain management with pain service  -agree with cefdinir for possible pouchitis   -worth considering celiac plexus block, potential benefits likely outweigh limited risks  -continue sorafenib at 200mg  daily (goal is eventually 400mg  daily)  -1L IVF today   -triamcinolone cream for body, can use sparingly on the face until dermatology visit (referral placed again today)  -clobetasol shampoo for scalp redness/itching  -RTC 1 month for tox check    I personally reviewed the medical records, pathology and laboratory results and viewed the imaging. All questions were answered to the patient's apparent satisfaction and they voiced understanding and agreement with the plan. Pt has my card and  contact information and is encouraged to reach out with any further questions.    Donzetta Sprung, MD/MPH  Assistant Professor  Bone and Soft Tissue Oncology Program  [facility]  Pager: [phone]  Nurse Navigator: Roseanne Reno RN, BSN, OCN      REASON FOR CONSULTATION:   [name] [name] is a 21 y.o. female who is seen in consultation at the request of [name] Jonita Albee, MD for evaluation of her desmoid fibromatosis    HISTORY OF PRESENT ILLNESS:      Attends school at [employer] in [place], [place]      Right arm desmoid is the most bothersome - significant arm cramping, deep aching pain, lasts for well as bilateral neck lesions, resected at [facility], found to be desmoid  02/2012 - surgery for thoracic spine desmoid. Maybe another flank lesion removed  2017 - wrist lesion treated by VIR with bupivicaine, kenalog  08/2016 - endoscopy with adenomatous polyp of the colon, non-dysplastic  02/2017 - subcutaneous lesion resection by plastic surgery (postauricular and RUE, RLE) - path revealed epidermal inclusion cysts, osteomas  05/2018: cryotherapy of left anterior chest wall lesion  05/2018: started sulindac but struggled with adherence due to GI side effects  08/2018: cryo #2 to chest wall lesion  12/10/2018: started sorafenib due to progression in left paraspinal lesion, ?posterior chest wall lesion  02/2019: discontinued sorafenib for several reasons: fatigue, dizziness, nausea, hair loss, as well as COVID infection in late November. Derm consulted for rash / hair loss, started on antihistamines, topical steroids. Trialed lower dose but ultimately stopped again 05/2019 due to toxicity.  06/2019: follow up with surgical oncology - plans for observation rather than repeat surgery  07/2019: cryoablation of anterior chest wall desmoid  07/2019: excision of subcutaneous LE mass and L neck masses, clinically appeared consistent with cysts  08/2019: cryoablation of R posterior chest wall, left paraspinal desmoid     Interval History:  ***    REVIEW OF SYSTEMS:  A comprehensive review of 12 systems was negative except for pertinent positives noted in HPI.    Past Medical History, Surgical History, Family History were reviewed personally. Any changes were updated as above.    Social History:  Attended [employer] in [place], has been interrupted by health issues  Wants to be a Engineer, civil (consulting) in pediatric hematology/oncology     Pregnancy status: premenopausal      ALLERGIES/MEDICATIONS:  Reviewed in Epic    PHYSICAL EXAM:   There were no vitals taken for this visit.  ECOG 0  Gen - well appearing, in no acute distress, mother accompanying her excision of subcutaneous LE mass and L neck masses, clinically appeared consistent with cysts  08/2019: cryoablation of R posterior chest wall, left paraspinal desmoid     Interval History:  She reports a difficult time since hospital discharge.  She continues to have significant abdominal pain, crampy and sharp, up to 10 out of 10.  She has had episodes of lightheadedness and dizziness.  These are associated with standing from a seated or lying position.  She reports 15-16 bowel movements daily, sometimes immediately following attempts at oral nutrition.  She has noticed scant blood in her stool.  She is having some hair thinning which is distressing to her, as well as an itchy red rash on her face and body.  She does not have dermatology follow-up, this has been problematic as she was previously engaged with pediatrics.  She endorses feeling overwhelmed, anxious about how  to get control over her disease and her symptoms, and discouraged by the lack of progress on nutrition.  She asks about the possibility of a break for her gut with something like TPN.  We reviewed that this is not off the table, but my hope is that there are some reversible elements that can help [name] avoid that.    REVIEW OF SYSTEMS:  A comprehensive review of 12 systems was negative except for pertinent positives noted in HPI.    Past Medical History, Surgical History, Family History were reviewed personally. Any changes were updated as above.    Social History:  Attended [employer] in [place], has been interrupted by health issues  Wants to be a Engineer, civil (consulting) in pediatric hematology/oncology   Mother [name] [name] is a strong advocate, often present at visits    Pregnancy status: premenopausal, interested in future children    ALLERGIES/MEDICATIONS:  Reviewed in Epic    PHYSICAL EXAM:   There were no vitals taken for this visit.  ECOG 1  Gen - well appearing, in no acute distress, mother accompanying her   Eyes - conjunctivae clear, PERRL  ENT - mask in place  Lymph - no cervical or supraclavicular lymphadenopathy appreciated  Resp - clear to auscultation bilaterally, no wheezes, crackles or rales  CV - normal rate, regular rhythm, no murmur appreciated, no lower extremity edema noted  GI - abdomen slightly firm, diffusely tender, prior surgical scars appear well-healed, no masses, bowel sounds normal  Skin - no rashes, no lesions noted  Neuro - AOx3, EOM intact, no facial droop, speech fluent and coherent, moves all extremities without asymmetry  Psych - affect normal, mood okay  MSK - no joint tenderness or swelling    PATHOLOGY:   Pathology was personally reviewed as described in the HPI, detailed in Epic.     LABS:  Reviewed in Epic    RADIOLOGY:  Imaging was personally viewed and interpreted as summarized in the history (see above).

## 2021-12-31 ENCOUNTER — Ambulatory Visit: Admit: 2021-12-31 | Discharge: 2022-01-01 | Payer: PRIVATE HEALTH INSURANCE

## 2021-12-31 ENCOUNTER — Other Ambulatory Visit: Admit: 2021-12-31 | Discharge: 2022-01-01 | Payer: PRIVATE HEALTH INSURANCE

## 2021-12-31 ENCOUNTER — Ambulatory Visit
Admit: 2021-12-31 | Discharge: 2022-01-01 | Payer: PRIVATE HEALTH INSURANCE | Attending: Registered" | Primary: Registered"

## 2021-12-31 DIAGNOSIS — D48119 Desmoid tumor of unspecified site: Secondary | ICD-10-CM | POA: Diagnosis not present

## 2021-12-31 DIAGNOSIS — R1030 Lower abdominal pain, unspecified: Principal | ICD-10-CM

## 2021-12-31 DIAGNOSIS — Z515 Encounter for palliative care: Principal | ICD-10-CM

## 2021-12-31 DIAGNOSIS — E041 Nontoxic single thyroid nodule: Principal | ICD-10-CM

## 2021-12-31 DIAGNOSIS — D500 Iron deficiency anemia secondary to blood loss (chronic): Principal | ICD-10-CM

## 2021-12-31 DIAGNOSIS — K219 Gastro-esophageal reflux disease without esophagitis: Secondary | ICD-10-CM | POA: Diagnosis not present

## 2021-12-31 DIAGNOSIS — F411 Generalized anxiety disorder: Secondary | ICD-10-CM | POA: Diagnosis not present

## 2021-12-31 DIAGNOSIS — Q8789 Other specified congenital malformation syndromes, not elsewhere classified: Secondary | ICD-10-CM | POA: Diagnosis not present

## 2021-12-31 DIAGNOSIS — Z79899 Other long term (current) drug therapy: Secondary | ICD-10-CM | POA: Diagnosis not present

## 2021-12-31 DIAGNOSIS — R110 Nausea: Secondary | ICD-10-CM | POA: Diagnosis not present

## 2021-12-31 DIAGNOSIS — Z8616 Personal history of COVID-19: Secondary | ICD-10-CM | POA: Diagnosis not present

## 2021-12-31 DIAGNOSIS — Z9221 Personal history of antineoplastic chemotherapy: Secondary | ICD-10-CM | POA: Diagnosis not present

## 2021-12-31 DIAGNOSIS — E43 Unspecified severe protein-calorie malnutrition: Secondary | ICD-10-CM | POA: Diagnosis not present

## 2021-12-31 DIAGNOSIS — I82A11 Acute embolism and thrombosis of right axillary vein: Secondary | ICD-10-CM | POA: Diagnosis not present

## 2021-12-31 DIAGNOSIS — F410 Panic disorder [episodic paroxysmal anxiety] without agoraphobia: Secondary | ICD-10-CM | POA: Diagnosis not present

## 2021-12-31 DIAGNOSIS — Z9049 Acquired absence of other specified parts of digestive tract: Secondary | ICD-10-CM | POA: Diagnosis not present

## 2021-12-31 DIAGNOSIS — R55 Syncope and collapse: Secondary | ICD-10-CM | POA: Diagnosis not present

## 2021-12-31 LAB — CBC W/ AUTO DIFF
BASOPHILS ABSOLUTE COUNT: 0 10*9/L (ref 0.0–0.1)
BASOPHILS RELATIVE PERCENT: 0.7 %
EOSINOPHILS ABSOLUTE COUNT: 0.2 10*9/L (ref 0.0–0.5)
EOSINOPHILS RELATIVE PERCENT: 3 %
HEMATOCRIT: 39.1 % (ref 34.0–44.0)
HEMOGLOBIN: 13.3 g/dL (ref 11.3–14.9)
LYMPHOCYTES ABSOLUTE COUNT: 2.4 10*9/L (ref 1.1–3.6)
LYMPHOCYTES RELATIVE PERCENT: 35.8 %
MEAN CORPUSCULAR HEMOGLOBIN CONC: 34.1 g/dL (ref 32.0–36.0)
MEAN CORPUSCULAR HEMOGLOBIN: 29.5 pg (ref 25.9–32.4)
MEAN CORPUSCULAR VOLUME: 86.6 fL (ref 77.6–95.7)
MEAN PLATELET VOLUME: 10.2 fL (ref 6.8–10.7)
MONOCYTES ABSOLUTE COUNT: 0.6 10*9/L (ref 0.3–0.8)
MONOCYTES RELATIVE PERCENT: 8.6 %
NEUTROPHILS ABSOLUTE COUNT: 3.5 10*9/L (ref 1.8–7.8)
NEUTROPHILS RELATIVE PERCENT: 51.9 %
PLATELET COUNT: 251 10*9/L (ref 150–450)
RED BLOOD CELL COUNT: 4.52 10*12/L (ref 3.95–5.13)
RED CELL DISTRIBUTION WIDTH: 12 % — ABNORMAL LOW (ref 12.2–15.2)
WBC ADJUSTED: 6.7 10*9/L (ref 3.6–11.2)

## 2021-12-31 LAB — COMPREHENSIVE METABOLIC PANEL
ALBUMIN: 3.8 g/dL (ref 3.4–5.0)
ALKALINE PHOSPHATASE: 77 U/L (ref 46–116)
ALT (SGPT): 11 U/L (ref 10–49)
ANION GAP: 6 mmol/L (ref 5–14)
AST (SGOT): 18 U/L (ref <=34)
BILIRUBIN TOTAL: 0.3 mg/dL (ref 0.3–1.2)
BLOOD UREA NITROGEN: 11 mg/dL (ref 9–23)
BUN / CREAT RATIO: 26
CALCIUM: 9.4 mg/dL (ref 8.7–10.4)
CHLORIDE: 107 mmol/L (ref 98–107)
CO2: 29 mmol/L (ref 20.0–31.0)
CREATININE: 0.43 mg/dL — ABNORMAL LOW
EGFR CKD-EPI (2021) FEMALE: >90 mL/min/{1.73_m2} (ref >=60)
GLUCOSE RANDOM: 80 mg/dL (ref 70–179)
POTASSIUM: 4.4 mmol/L (ref 3.5–5.1)
PROTEIN TOTAL: 6.6 g/dL (ref 5.7–8.2)
SODIUM: 142 mmol/L (ref 135–145)

## 2021-12-31 LAB — IRON PANEL
IRON SATURATION: 15 % — ABNORMAL LOW (ref 20–55)
IRON: 42 ug/dL — ABNORMAL LOW
TOTAL IRON BINDING CAPACITY: 279 ug/dL (ref 250–425)

## 2021-12-31 LAB — TSH: THYROID STIMULATING HORMONE: 1.639 u[IU]/mL (ref 0.550–4.780)

## 2021-12-31 MED ORDER — TRIAMCINOLONE ACETONIDE 0.1 % TOPICAL CREAM
Freq: Two times a day (BID) | TOPICAL | 3 refills | 0 days | Status: CP
Start: 2021-12-31 — End: 2022-12-31

## 2021-12-31 MED ORDER — OXYCODONE 5 MG TABLET
ORAL_TABLET | ORAL | 0 refills | 1 days | Status: CP | PRN
Start: 2021-12-31 — End: 2022-01-01
  Filled 2021-12-31: qty 6, 1d supply, fill #0

## 2021-12-31 MED ORDER — CLOBETASOL 0.05 % SHAMPOO
Freq: Two times a day (BID) | TOPICAL | 0 refills | 0 days | Status: CP
Start: 2021-12-31 — End: 2022-12-31

## 2021-12-31 MED ADMIN — sodium chloride 0.9% (NS) bolus 1,000 mL: 1000 mL | INTRAVENOUS | @ 19:00:00 | Stop: 2021-12-31

## 2021-12-31 NOTE — Unmapped (Signed)
1421 PT arrived for scheduled infusion. Pt tolerated infusion and discharged home with family.

## 2021-12-31 NOTE — Unmapped (Signed)
OUTPATIENT ONCOLOGY PALLIATIVE CARE    Principal Diagnosis: [name] [name] is a 21 y.o. female with [name] and desmoid fibromatosis, diagnosed in early childhood and continues to follow with pediatric oncologist Dr Ciro Backer with plans to transition to the medical oncology / sarcoma team in the coming year. Disease sites include paraspinal, chest wall, right arm/wrist, left lower extremity, back. The palliative care team was consulted as part of the [facility] sarcoma collaborative program to provide early, ongoing, and individualized support to young adults with sarcoma.    Assessment/Plan:   1. 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 Trial of duloxetine led to adverse effect of increased nausea so stopped taking. Receiving support from [facility].  -- Continue with therapist in [place] who offered EMDR due to presumed medical PTSD  -- Working with [name], LCSW, when not in [place]  -- Continue mirtazapine 7.5mg  qHS and olanzapine 2.5mg     2. Desmoid-associated pain: had been exacerbated by menses, pain medicines had been paused on account of nursing school studies/demands, but due to poor pain control and inability to focus, plan was made to take a semester off. Avoiding NSAIDs per GI.  -- Continue pain regimen per Dr Theora Gianotti pain clinic, currently using oxycodone 10mg  q4h daytime and Dilaudid 3mg  q4h at night, as well as gabapentin and other adjuncts  -- Support plan to consider alternative/adjunctive pain management strategies including nerve blocks, bowel rest    3. Nausea: Intermittent, not discussed in detail today  -- Continue ondansetron 8mg  ODT q8h PRN    4. Coping/GOC: Doing poorly at this time; feels well supported by family and clinical team. School has become more supportive after administration changes.      # Controlled substances risk management.   - Patient does not have a signed pain medication agreement with our team. We discussed the details of the contract during our 07/16/21 visit.   - [place] database was reviewed today and it  N/A  appropriate.   - Urine drug screen was not performed at this visit. Findings: not applicable.   - Patient has received information about safe storage and administration of medications.   - Patient has not received a prescription for narcan; is not applicable.       F/u: PRN, care coordination    ----------------------------------------  Referring Provider: Debbe Mounts, MD  Oncology Team: Sarcoma -- Dr [name]  PCP: Jarold Motto, [facility]      HPI: [name] [name] is a 21 y.o. woman with [name] and desmoid fibromatosis who presented in early childhood and has been followed closely by pediatric oncology. 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 She is in nursing school and has experienced worsening anxiety over course of past months to years preceding her introduction to the medical oncology team in December 2021. Had a bowel obstruction and was on TPN in the past; more recently experiencing severe abdominal pain associated with intra-abdominal desmoids and considering nerve blocks and other non-traditional pain management strategies.    Current cancer-directed therapy: back on low-dose sorafenib; s/p sorafenib x 6 months w/significant toxicity and potentially confounding factors, also s/p loop ileostomy and takedown with J-pouch.    Today's update (Stein/Daivd Fredericksen/Swift, in clinic 12/31/21):  -- Increased weakness, abdominal pain, frequent (up to  15x/day) bowel movements, also with skin changes and hair loss  -- Describes feeling miserable and unable to function with current status  -- Pain has been exacerbated by feeds so has decreased volumes and slowed the rate  -- Saw GI, now on cefdinir to help with bowel movements, possible pouchitis  -- Wondering about TPN for bowel rest, also curious about a G-tube to replace NG  -- Will see Dr [name] with pain management clinic tomorrow  -- Wondering about fertility and how it would be affected if switches to new desmoid medication known to cause amenorrhea    Historical (Vicenta Olds, virtual 08/13/21):  -- Still having back pain and winged scapula, had improved somewhat but worsened after more activity during a mission trip in [place]  -- Will take 1,000mg  Tylenol twice a day and 2-3 tabs of 5mg  oxycodone, has been trying to use less than needed because wasn't sure when she would get to meet and get a refill, also hadn't brought sufficient quantity to [facility]  -- Nausea has been better since starting ondansetron 8mg , only occurring once a day  -- Feels like she got dehydrated last week, wondering about IV fluids    Today's update (Donella Pascarella in clinic 07/16/21):  -- Pain worsening, very positional; grasp strength decreased, shaking and cramping in R hand, numbness in pinky. Using 5mg  oxycodone with 1,000mg  Tylenol twice a day approximately, may add 2.5mg  here and there, has about 5 tablets remaining. Pain can get down to 2-3/10 depending on timing and extent of pain prior to taking medication  -- ROM severely impacted by pain though trying to stay as mobile as possible to avoid causing more problems  -- Nausea exacerbated by pain and responding better to 8mg  ondansetron  -- PT trying to work with her but not without a specific order to focus on R shoulder    Today's update (Damaria Stofko via video, 07/09/21):  -- Pain has been flaring a lot over the past week, up to 8/10 or more, at one point she passed out from the pain though wondering if she was also dehydrated  -- Triggers include movement and sitting/pressing against a chair, pain described as super heavy feeling resembling prior desmoid pain rather than muscular  -- Attempted to work with PT yesterday but they require a specific order for shoulder  -- Oxycodone 5mg  will help it improve to 4/10, onset is 30 minutes and relief lasts about 5 hours  -- Using up to 3 tabs daily and 100mg  gabapentin at night  -- Shaking in R hand is worse than last week  -- J-pouch currently not affected by oxycodone   -- Using 7.5mg  mirtazapine mostly for sleep because appetite has been good  -- Nausea somewhat controlled with Zofran 4mg  ODT but tends to become nauseated from pain and prefers to try a higher dose    Interval history (Melaya Hoselton via video, 07/02/21):  -- Had been doing well managing tumor pain for many months  -- Last week developed new changes in R shoulder with pain refractory to Tylenol  -- Saw PT yesterday, described as winged and feels like it's caught under shoulder blade, did not want to manipulate due to concern about pinched nerve  -- Mentioned that there are surgical changes in that area from prior resections  -- Pain is described as sharp, shooting at times, can involve entire R arm, had some intermittent numbness in last 2 fingers of R hand and some increased shakiness  -- Tylenol (1,000mg ) has been  somewhat helpful and tried 5mg  oxycodone with some relief  -- Completing this semester of nursing school this week and hoping to return full time next semester    Interval history, Lachlan Pelto via video 03/26/21:  -- Pain in area of clot, causing swelling in arm and neck, will experience both deep throbbing achy pain as well as sharp, shooting  -- Has been using Tramadol 50mg  once a day and oxycodone 5mg  1-2x/day for a 9/10, will kick in around 30 minutes later and last about 4 hours, gets the pain down to a 4/10  -- Was not able to go out over the weekend due to pain issues  -- Appetite has improved and weight gain is better but some is swelling  -- Hoping the TPN will be discontinued within the next 3-4 weeks  -- Now on 15mg  mirtazapine which is helpful with appetite  -- GI pain has improved, rectal pain improved, uses Bentyl when there's pouch cramping  -- Has had some reflux symptoms which have improved  -- Working with [name] bc spending less time in [place]    Today's update (Emeka Lindner/Swift/Stein, 11/20/2020):  -- Worsening debilitating cramping pain at site of J-pouch with blood in stools and incontinence  -- Pain is intermittent and unpredictable: cramping not responding to Tylenol/ibuprofen but overall useful, and gabpentin helpful at bedtime  -- Experiencing some lack of energy  -- Very mild nausea and somewhat poor appetite  -- No SOB  -- Mood not great, also with anxiety, related to new/progressive symptoms  -- Overall QOL rated as a 2 out of 7 due to above issues all related to new/worsening pain episodes of unclear etiology  -- Wondering about a cyst on R thigh which is growing, and the abnormality on MRI of R thigh  -- Also curious about cysts in liver and kidney, wonders how often she will need surveillance scans    Interval history (Cezar Misiaszek via video, 11/13/2020):  -- Pain: regimen discussed was causing lots of grogginess, specifically the gabapentin. Tried to stop taking medications so that she could focus on studies. Also began menstruating this week which significantly exacerbated her pain. Unfortunately, her uncontrolled pain made studying very difficult, and she felt unprepared for upcoming exams and clinical rotations. Decision was made to take a semester off from nursing school to focus on her own mental and physical health so that she is best equipped to help care for others as she progresses through her nursing program.  -- Successfully cut out oxycodone except for prior to scans and once when menstrual pain was especially severe  -- Rectal irritation persistent, addition of Vaseline has been helpful, but there is significant swelling between vagina and anus. She also thinks she saw blood coming from the J-pouch that was separate from menstrual bleeding.    Interval history (Dalilah Curlin, video visit 11/06/2020):  Overall doing well. Nursing school with new administration, now more accommodating. Clinicals start Sept 23rd and she has some questions related to feeling somewhat nervous about going back to campus  -- Lots of activity / exertion, requesting a parking pass  -- Testing accommodations due to frequency of J pouch emptying (6-7x/day)  -- Had received a lot of saline bags due to ease of dehydration, wondering if they can still be used, possibly at school, wonders about having a note for school clinic    Last week central line came out, had been using home TPN due to a blockage, doing PT 2-3x/week to get strength back up.  Her second reversal surgery went well and she is maintaining weight well. Says overall doing well though has been difficult to learn everything related to caring for herself after surgeries. She said she recently started to develop more pain and swelling at the surgical site, was seen by surgery team, given additional doses of 10mg  oxycodone. Says she can't tell when her bladder is full and she has to urinate, and that her bladder pain seems to be triggered by J-pouch. Also with some rectal irritation, using a cream that includes menthol and zinc oxide -- rec vaseline    Pain at surgical site: described as a bunch of different pains including deep, dull pain, sharp pain, stretching/muscle pain not relieved by muscle relaxant, had been told the pain is related to the long incision, radiates around toward R hip; has been using heat and ice, wearing a binder recommended by PT, stopped wearing it overnight. Will take 400mg  ibuprofen approximately twice a day. Will also use gabapentin as needed and also a muscle relaxant. Pain tends to fluctuate without an obvious trigger. Oxycodone 10mg  has been helping at night. Would like to avoid opioids if possible, wondering about a different regimen.    Interval events: (Joint visit w/Dr Coralyn Helling AYA NP, and [name] AYA SW): -- Worsening GI symptoms since colonoscopy including: bleeding (most concerning aspect), occurs not only with bowel movements but also without; increased frequency of restroom use and sometimes feels the urge but no bowel movement just blood; intense cramping abdominal pain that did not improve with dietary changes. Dr Cheron Schaumann (GI) prescribed a new anti-spasmodic medication but she hasn't started it yet. Dr Elenore Rota (GI surgeon) did not recommend making a bunch of dietary modifications due to upcoming surgery scheduled for early June. Mirtazapine (7.5mg ) has been helpful for nausea.  -- Nutrition has been an issue, has been using smoothies but concerned about how to get adequate nutrition before and after surgery; has been started on vitamin supplementation by various clinicians, currently on B12 injections and a prenatal vitamin  -- Endocrinology Dr Timmothy Euler sent labs to check but they were delayed due to recent Depo shot which he advised not to continue, recommends Lo-lo-estrin  -- Some muscle spasms/clenching, Dr Selena Batten and Dr Darral Dash were expected to be in communication about whether or not a L thigh mass might be desmoid.  -- Was seen by campus health and labs revealed low Vit D, low B9, relatively unremarkable iron labs, started on a prenatal multivitamin  -- Beach trip in May and friend's wedding at [place] end of May, will also visit [employer]. Looking forward to some fun times before the surgery and really dreading the possibility of an ostomy    Next steps, as communicated to Drs Ciro Backer and Elenore Rota:  -- Nutrition (ASAP, Lance Bosch)  -- Check back in with endocrinology re low-dose progesterone only OCP  -- Meet with pediatric outpatient oncology RN if possible Silvestre Moment to connect)  -- May = carefree month of fun (beach, [employer]/wedding, Transport planner)  -- Surgery (early June)  -- Imaging (July/August) for baseline scans, Dr [name] to discuss w/Dr Darral Dash  -- Consider pazopanib vs retry sorafenib in future vs nirogacestat    Symptom Review (early 2022, did not discuss in detail at today's visit)  General: doing well, healing well after steroid injection  Pain: pain in tumors, particularly in chest (R side between muscle and rib, L surface), for past 2 weeks, attributes to carrying heavy backpacks; has tried heat packs but not  super helpful; has used Tylenol (500mg ) 3-4x this week; also with muscle cramps in legs and sometimes in her arms, notices at bedtime right after taking Remeron  Fatigue: often, better this semester  Mobility: good  Sleep: improved on Remeron  Appetite: improved on Remeron  Nausea: has worsened over past year or so, tends to occur daily, will worsen with higher anxiety days  Bowel function: not an issue  Dyspnea: none  Secretions: not an issue  Mood: increased anxiety over past months to a year      Palliative Performance Scale: 80% - Ambulation: Full / Normal Activity with effort, some evidence of disease / Self-Care:Full / Intake: Normal or reduced / Level of Conscious: Full     Coping/Support Issues: supported by family (parents greatest source of support) and nursing program, looking forward to getting her nursing degree and very dedicated to her work/studies, which made taking a break from school an especially difficult decision.    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  Hobbies: not discussed  Current residence / distance from [facility]: lives in [place] when not at nursing school, approximately 1 hour from [facility]    Advance Care Planning:   HCPOA: unknown  Natural Runner, broadcasting/film/video: mother  Living Will: unknown  ACP note: none        Objective     Opioid Risk Tool:    Female  Female    Family history of substance abuse      Alcohol  1  3    Illegal drugs  2  3    Rx drugs  4  4    Personal history of substance abuse      Alcohol  3  3    Illegal drugs  4  4    Rx drugs  5  5    Age between 16--45 years  1  1    History of preadolescent sexual abuse  3  0    Psychological disease      ADD, OCD, bipolar, schizophrenia  2  2    Depression  1  1       Total: not assessed to date  (<3 low risk, 4-7 moderate risk, >8 high risk)    Oncology History    No history exists.       Patient Active Problem List   Diagnosis    [name]    Intestinal polyps    [name]    Benign neoplasm of skin of trunk    Other benign neoplasm of connective and other soft tissue of upper limb, including shoulder    History of colonic polyps    [name] of skin    Thyroid cyst    Syncope    Chemotherapy-induced nausea    Pain    Iron deficiency anemia due to chronic blood loss    Dehydration    Physical deconditioning    History of colectomy    Acute deep vein thrombosis (DVT) of axillary vein of right upper extremity (CMS-HCC)    Severe protein-calorie malnutrition (CMS-HCC)    Winged scapula of right side    Lower abdominal pain    Intractable abdominal pain  Generalized anxiety disorder with panic attacks       Past Medical History:   Diagnosis Date    Abdominal pain     Acid reflux     occas    Anesthesia complication     itching, shaking, coldness; last few surgeries have gone much better    Cataract of right eye     COVID-19 virus infection 01/2019    Cyst of thyroid determined by ultrasound     monitoring    [name]     2 right forearm, 1 left thigh, 1 right scapula, 1 under left clavicle; multiple    Difficult intravenous access     [name] (familial adenomatous polyposis)     [name]     Gastric polyps     History of chemotherapy     last treatment approx 05/2019    History of colon polyps     History of COVID-19 01/2019    Iron deficiency anemia due to chronic blood loss     received iron infusion 11-2019    PONV (postoperative nausea and vomiting)     Rectal bleeding     Syncopal episodes     especially if becoming dehydrated       Past Surgical History:   Procedure Laterality Date    cyroablation      cystis removal      desmoid removal      PR CLOSE ENTEROSTOMY,RESEC+ANAST N/A 10/09/2020    Procedure: ILEOSTOMY TAKEDOWN;  Surgeon: Mickle Asper, MD;  Location: OR [place];  Service: General Surgery    PR COLONOSCOPY W/BIOPSY SINGLE/MULTIPLE N/A 10/27/2012    Procedure: COLONOSCOPY, FLEXIBLE, PROXIMAL TO SPLENIC FLEXURE; WITH BIOPSY, SINGLE OR MULTIPLE;  Surgeon: Shirlyn Goltz Mir, MD;  Location: PEDS PROCEDURE ROOM [place];  Service: Gastroenterology    PR COLONOSCOPY W/BIOPSY SINGLE/MULTIPLE N/A 09/14/2013    Procedure: COLONOSCOPY, FLEXIBLE, PROXIMAL TO SPLENIC FLEXURE; WITH BIOPSY, SINGLE OR MULTIPLE;  Surgeon: Shirlyn Goltz Mir, MD;  Location: PEDS PROCEDURE ROOM [place];  Service: Gastroenterology    PR COLONOSCOPY W/BIOPSY SINGLE/MULTIPLE N/A 11/08/2014    Procedure: COLONOSCOPY, FLEXIBLE, PROXIMAL TO SPLENIC FLEXURE; WITH BIOPSY, SINGLE OR MULTIPLE;  Surgeon: Arnold Long Mir, MD;  Location: PEDS PROCEDURE ROOM [facility];  Service: Gastroenterology    PR COLONOSCOPY W/BIOPSY SINGLE/MULTIPLE N/A 12/26/2015    Procedure: COLONOSCOPY, FLEXIBLE, PROXIMAL TO SPLENIC FLEXURE; WITH BIOPSY, SINGLE OR MULTIPLE;  Surgeon: Arnold Long Mir, MD;  Location: PEDS PROCEDURE ROOM [facility];  Service: Gastroenterology    PR COLONOSCOPY W/BIOPSY SINGLE/MULTIPLE N/A 09/02/2017    Procedure: COLONOSCOPY, FLEXIBLE, PROXIMAL TO SPLENIC FLEXURE; WITH BIOPSY, SINGLE OR MULTIPLE;  Surgeon: Arnold Long Mir, MD;  Location: PEDS PROCEDURE ROOM ;  Service: Gastroenterology    PR COLSC FLX W/REMOVAL LESION BY HOT BX FORCEPS N/A 08/27/2016    Procedure: COLONOSCOPY, FLEXIBLE, PROXIMAL TO SPLENIC FLEXURE; W/REMOVAL TUMOR/POLYP/OTHER LESION, HOT BX FORCEP/CAUTE;  Surgeon: Arnold Long Mir, MD;  Location: PEDS PROCEDURE ROOM [facility];  Service: Gastroenterology    PR COLSC FLX W/RMVL OF TUMOR POLYP LESION SNARE TQ N/A 02/25/2019    Procedure: COLONOSCOPY FLEX; W/REMOV TUMOR/LES BY SNARE;  Surgeon: Helyn Numbers, MD;  Location: GI PROCEDURES [place] [facility];  Service: Gastroenterology    PR COLSC FLX W/RMVL OF TUMOR POLYP LESION SNARE TQ N/A 03/13/2020    Procedure: COLONOSCOPY FLEX; W/REMOV TUMOR/LES BY SNARE;  Surgeon: Helyn Numbers, MD;  Location: [facility] PROCEDURES [place] [facility];  Service: Gastroenterology    PR EXC SKIN BENIG 2.1-3 CM  TRUNK,ARM,LEG Right 02/25/2017    Procedure: EXCISION, BENIGN LESION INCLUDE MARGINS, EXCEPT SKIN TAG, LEGS; EXCISED DIAMETER 2.1 TO 3.0 CM;  Surgeon: Clarene Duke, MD;  Location: CHILDRENS OR [facility];  Service: Plastics    PR EXC SKIN BENIG 3.1-4 CM TRUNK,ARM,LEG Right 02/25/2017    Procedure: EXCISION, BENIGN LESION INCLUDE MARGINS, EXCEPT SKIN TAG, ARMS; EXCISED DIAMETER 3.1 TO 4.0 CM;  Surgeon: Clarene Duke, MD;  Location: CHILDRENS OR [facility];  Service: Plastics    PR EXC SKIN BENIG >4 CM FACE,FACIAL Right 02/25/2017    Procedure: EXCISION, OTHER BENIGN LES INCLUD MARGIN, FACE/EARS/EYELIDS/NOSE/LIPS/MUCOUS MEMBRANE; EXCISED DIAM >4.0 CM;  Surgeon: Clarene Duke, MD;  Location: CHILDRENS OR [facility];  Service: Plastics    PR EXC TUMOR SOFT TISSUE LEG/ANKLE SUBQ 3+CM Right 08/05/2019    Procedure: EXCISION, TUMOR, SOFT TISSUE OF LEG OR ANKLE AREA, SUBCUTANEOUS; 3 CM OR GREATER;  Surgeon: Arsenio Katz, MD;  Location: MAIN OR [place];  Service: Plastics    PR EXC TUMOR SOFT TISSUE LEG/ANKLE SUBQ <3CM Right 08/05/2019    Procedure: EXCISION, TUMOR, SOFT TISSUE OF LEG OR ANKLE AREA, SUBCUTANEOUS; LESS THAN 3 CM;  Surgeon: Arsenio Katz, MD;  Location: MAIN OR [facility];  Service: Plastics    PR LAP, SURG PROCTECTOMY W J-POUCH N/A 08/10/2020    Procedure: ROBOTIC ASSISTED LAPAROSCOPIC PROCTOCOLECTOMY, ILEAL J POUCH, WITH OSTOMY;  Surgeon: Mickle Asper, MD;  Location: OR [place];  Service: General Surgery    PR NDSC EVAL INTSTINAL POUCH DX W/COLLJ SPEC SPX N/A 01/23/2021    Procedure: ENDO EVAL SM INTEST POUCH; DX;  Surgeon: Modena Nunnery, MD;  Location: GI PROCEDURES [place] [facility];  Service: Gastroenterology    PR NDSC EVAL INTSTINAL POUCH DX W/COLLJ SPEC SPX N/A 08/27/2021    Procedure: ENDO EVAL SM INTEST POUCH; DX;  Surgeon: Hunt Oris, MD;  [facility];  Service: Gastroenterology    PR NDSC EVAL INTSTINAL POUCH DX W/COLLJ SPEC SPX N/A 12/09/2021    Procedure: ENDO EVAL SM INTEST POUCH; DX;  Surgeon: Vidal Schwalbe, MD;  [facility];  Service: Gastroenterology    PR UNLISTED PROCEDURE SMALL INTESTINE  01/23/2021    Procedure: UNLISTED PROCEDURE, SMALL INTESTINE;  Surgeon: Modena Nunnery, MD;  Location: GI PROCEDURES [place] [facility];  Service: Gastroenterology    PR UPPER GI ENDOSCOPY,BIOPSY N/A 10/27/2012    Procedure: UGI ENDOSCOPY; WITH BIOPSY, SINGLE OR MULTIPLE;  Surgeon: Shirlyn Goltz Mir, MD;  Location: PEDS PROCEDURE ROOM [facility];  Service: Gastroenterology    PR UPPER GI ENDOSCOPY,BIOPSY N/A 09/14/2013    Procedure: UGI ENDOSCOPY; WITH BIOPSY, SINGLE OR MULTIPLE;  Surgeon: Shirlyn Goltz Mir, MD;  Location: PEDS PROCEDURE ROOM [facility]  Service: Gastroenterology    PR UPPER GI ENDOSCOPY,BIOPSY N/A 11/08/2014    Procedure: UGI ENDOSCOPY; WITH BIOPSY, SINGLE OR MULTIPLE;  Surgeon: Arnold Long Mir, MD;  Location: PEDS PROCEDURE ROOM [facility];  Service: Gastroenterology    PR UPPER GI ENDOSCOPY,BIOPSY N/A 12/26/2015    Procedure: UGI ENDOSCOPY; WITH BIOPSY, SINGLE OR MULTIPLE;  Surgeon: Arnold Long Mir, MD;  Location: PEDS PROCEDURE ROOM [facility];  Service: Gastroenterology    PR UPPER GI ENDOSCOPY,BIOPSY N/A 08/27/2016    Procedure: UGI ENDOSCOPY; WITH BIOPSY, SINGLE OR MULTIPLE;  Surgeon: Arnold Long Mir, MD;  Location: PEDS PROCEDURE ROOM [facility];  Service: Gastroenterology    PR UPPER GI ENDOSCOPY,BIOPSY N/A 09/02/2017    Procedure: UGI ENDOSCOPY; WITH BIOPSY, SINGLE OR MULTIPLE;  Surgeon: Arnold Long  Mir, MD;  Location: PEDS PROCEDURE ROOM [facility];  Service: Gastroenterology    PR UPPER GI ENDOSCOPY,BIOPSY N/A 03/13/2020    Procedure: UGI ENDOSCOPY; WITH BIOPSY, SINGLE OR MULTIPLE;  Surgeon: Helyn Numbers, MD;  Location: GI PROCEDURES [facility];  Service: Gastroenterology    PR UPPER GI ENDOSCOPY,BIOPSY N/A 09/05/2021    Procedure: UGI ENDOSCOPY; WITH BIOPSY, SINGLE OR MULTIPLE;  Surgeon: Wendall Papa, MD;  [facility]: [facility];  Service: Gastroenterology    TUMOR REMOVAL      multiple-head, neck, back, hand, right flank, multiple       Current Outpatient Medications   Medication Sig Dispense Refill    acetaminophen (TYLENOL) 500 MG tablet Take 2 tablets (1,000 mg total) by mouth two (2) times a day.      baclofen (LIORESAL) 5 mg Tab tablet Take 1-2 tablets (5-10 mg total) by mouth two (2) times a day as needed for muscle spasms. 40 tablet 0    cefdinir (OMNICEF) 300 MG capsule Take 1 capsule (300 mg total) by mouth Two (2) times a day. 180 capsule 1    COURIERED MED OR SUPPLY [id number], SORAFENIB 200MG  TABLET 1 each 0    diclofenac sodium (VOLTAREN) 1 % gel Apply 2 g topically four (4) times a day. 100 g 0    famotidine (PEPCID) 20 MG tablet Take 1 tablet (20 mg total) by mouth nightly. 90 tablet 3    gabapentin (NEURONTIN) 100 MG capsule Take 100mg  in the morning and afternoon. Take 300 mg at night. 120 capsule 3    hydrocortisone 2.5 % cream Apply 1 application. topically daily as needed.      HYDROmorphone (DILAUDID) 2 MG tablet Take 1.5 tablets (3 mg total) by mouth every six (6) hours for 14 days. 84 tablet 0    HYDROmorphone (DILAUDID) 2 MG tablet Take 1.5 tablets (3 mg total) by mouth every three (3) hours as needed for moderate pain or severe pain. 120 tablet 0    hydrOXYzine (ATARAX) 25 MG tablet Take 1 tablet (25 mg total) by mouth every six (6) hours as needed for itching or anxiety (also insomnia). 20 tablet 0    lidocaine (LIDODERM) 5 % patch Place 1 patch on the skin daily. Apply to affected area for 12 hours only each day (then remove patch)      menthol-zinc oxide (CALMOSEPTINE) 0.44-20.6 % Oint Apply topically four (4) times a day as needed. 113 g 11    mirtazapine (REMERON) 7.5 MG tablet Take 1 tablet (7.5 mg total) by mouth nightly. 90 tablet 3    naloxone (NARCAN) 4 mg nasal spray One spray in either nostril once for known/suspected opioid overdose. May repeat every 2-3 minutes in alternating nostril til EMS arrives 2 each 0    OLANZapine (ZYPREXA) 2.5 MG tablet Take 1 tablet (2.5 mg total) by mouth nightly. 30 tablet 0    ondansetron (ZOFRAN-ODT) 8 MG disintegrating tablet Take 1 tablet (8 mg total) by mouth every twelve (12) hours as needed for nausea.      oxyCODONE (ROXICODONE) 5 MG immediate release tablet Take 2 tablets (10 mg total) by mouth every four (4) hours as needed for pain. Per pt, [name] MD has her taking oxy during the day      SORAfenib (NEXAVAR) 200 mg tablet Take 2 tablets (400 mg total) by mouth daily. 60 tablet 2    tacrolimus (PROTOPIC) 0.1 % ointment Apply 1 application. topically Two (2) times a day. Apply two  times a day to the face 100 g 0    triamcinolone (KENALOG) 0.025 % ointment Apply two times a day as needed to the body for itchy skin or rash. 30 g 0    zinc oxide 10 % Crea Apply 1 application. topically daily as needed.       No current facility-administered medications for this visit.       Allergies:   Allergies   Allergen Reactions    Adhesive Tape-Silicones Hives and Rash     Paper tape  And tegederm ok    Ferrlecit [Sodium Ferric Gluconat-Sucrose] Swelling and Rash    Methylnaltrexone      Per Patient: I lost bowel control, severe abdominal cramping, and elevated BP    Neomycin Swelling     Rxn after ear drops; ear swelling    Papaya Hives    Morphine Nausea And Vomiting    Zosyn [Piperacillin-Tazobactam] Itching and Rash     Red and itchy    Compazine [Prochlorperazine] Other (See Comments)     Extreme agitation    Latex, Natural Rubber Rash    Opioids - Morphine Analogues Itching and Rash     Just morphine other opioids are fine       Family History:  Cancer-related family history includes Cancer in her maternal grandmother. There is no history of Cancer or Melanoma.  She indicated that her mother is alive. She indicated that her father is alive. She indicated that her sister is alive. She indicated that her brother is alive. She indicated that the status of her maternal grandmother is unknown. She indicated that the status of her maternal grandfather is unknown. She indicated that the status of her paternal grandmother is unknown. She indicated that the status of her paternal grandfather is unknown. She indicated that the status of her maternal aunt is unknown. She indicated that the status of her maternal uncle is unknown. She indicated that the status of her paternal aunt is unknown. She indicated that the status of her paternal uncle is unknown. She indicated that the status of her neg hx is unknown.      REVIEW OF SYSTEMS:  A comprehensive review of 14 systems was negative except for pertinent positives noted in HPI.      PHYSICAL EXAM:   VS reviewed in Epic, normal range  General: well appearing, engaged  HEENT: NCAT, no facial edema  Skin: diffuse pinpoint papules to entire face, covered in makeup but apparent nonetheless; +hair thinning  Psych: constricted affect, some tears when describing her current medical state      Lab Results   Component Value Date    CREATININE 0.43 (L) 12/31/2021     Lab Results   Component Value Date    ALKPHOS 77 12/31/2021    BILITOT 0.3 12/31/2021    BILIDIR 0.20 12/04/2021    PROT 6.6 12/31/2021    ALBUMIN 3.8 12/31/2021    ALT 11 12/31/2021    AST 18 12/31/2021       I personally spent 60 minutes face-to-face and non-face-to-face in the care of this patient, which includes all pre, intra, and post visit time on the date of service.        [name], MD PhD   [facility]  [facility]

## 2021-12-31 NOTE — Unmapped (Addendum)
Chronic Pain Follow Up Note        Assessment and Plan    1. Chronic pain syndrome    2. Diffuse myofascial pain syndrome    3. Intermittent lower abdominal pain    4. Generalized abdominal pain    5. Desmoid fibromatosis    6. [name]            [name] [name] is a 21 y.o. female with past medical history significant for DVT (RUE, s/p Eliquis x3 months), [name] syndrome s/p colectomy, desmoid tumor, anemia, and severe protein-calorie malnutrition who is being followed at [facility] clinic for abdominal and right shoulder pain that is consistent with the diagnosis of cancer associated pain.    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 Pt has continued and now chronic pain related to her tumors, has R shoulder pain as well. Follows with [place] [facility] Dr [name] as well as by [facility] oncology palliative care (Dr [name]) for pall care sx and pain mng.    Chronic abdominal pain  Chronic, not at goal. Worsening over the last 4 months and currently her worst pain.  Patient notes that her abdominal pain will flare frequently but subsides after each flare, most recently admitted for several days given abdominal pain.  She notes being told by other physicians that it may be due to intermittent obstruction of her bowels from her abdominal desmoid tumors.  Pain is described as predominately in left lower quadrant with new pain in the right lower quadrant. Abdominal pain currently managed with Gabapentin with goal to increase to 300 mg nightly, baclofen, and a combination of oxycodone and dilaudid. Patient is currently taking oxy 10 mg Q4H during the day and 3 mg Dilaudid nightly. Goal is to eventually start patient on Butran patch for long term treatment of her chronic abdominal pain given that full-mu agonists are not indicated for the treatment of chronic abdominal pain; however, her current pain flare has necessitated the use of opioids at doses that preclude a transition to [facility] for fear of precipitating withdrawal. Will therefore defer a rotation to Butran patches until patient is able to wean down her current opioid regiment.  Additionally, her September CT showed masses in LLQ, LUQ and periumbilical regions. The RLQ pain and the masses in the periumbilical and LUQ regions would be covered by a celiac plexus block while the pain in the LLQ may also be partly covered, though if a celiac plexus block failed to cover the LLQ then an inferior hypogastric plexus block would be the next best step. Plan to perform block at next visit and if abdominal pain is improved, then plan to wean down oxy and dilaudid and then transition to butran patch. Urine tox screen and opioid agreement current and appropriate. Reviewed labs from 12/31/2021, CMP shows normal Creatinine at 0.43, normal AST at 18, and normal ALT at 11.     - Continue Gabapentin 200 mg at bedtime, gradual increase to 300 mg nightly  - Continue Baclofen 5mg , can take up to 3 times per day  - Will start Butrans patch at 10 mcg/hr after able to wean patient from current oxy regiment, informed patient to wean to #3 tabs of oxycodone per day for 2 days prior to initiation of Butrans and then wean completely off over the subsequent days; also informed patient to use different locations for each patch  application and avoid use of a heating pad on the patch; finally, informed patient to use Flonase on skin, let dry, and then apply patch if skin sensitivity occurs  - Continue Oxycodone 10 mg Q4H prn   - Continue Dilaudid 3 mg nightly prn   - Ordered celiac plexus block    [name] of skin; [name]; Cancer associated pain; Right shoulder pain, unspecified chronicity; allodynia; diffuse myofacial pain; chronic pain syndrome  Chronic, not at goal but improved and now secondary pain generator. Patient has a multi-year history of [name] s/p colectomy and multiple desmoid tumors tumors, historically over the lateral aspect of right forearm and posterior aspect of right upper arm. She has been utilizing oxycodone, gabapentin, and lidocaine with moderate benefit, though her pain continues to greatly impact her ability to be active, drive, and participate in school. Her R shoulder pain appears to be primarily nociceptive with a strong nociplastic component manifesting as allodynia and hyperalgesia.  Patient has endorsed significant improvement in right shoulder function following previous TPI and patient's hyperalgesia has improved following prior TPI.  We will continue to perform trigger point injections given their efficacy in controlling the myofascial component of her overall pain.      - Continue Voltaren 1% gel.   - Trigger points performed today, will reorder repeat trigger points in 4 to 6 weeks  - Rotation from oxycodone to Butrans as above  - Continue PT  - Encouraged slow resumption of normal activity and times when pain has decreased  - Encouraged patient to follow-up with PCP regarding her concerns about possible malnutrition    Medication Monitoring  [place] PDMP was reviewed today and it was appropriate.  Patient brought medications to clinic today for pill count.    Last urine toxicology screen: 12/04/21  Treatment agreement last reviewed and signed: 12/04/21  Aberrancy with treatment plan? no  Opioid Risk: low  Pain psychology: no  Benzodiazepines: no  Naloxone: N/A  EKG: NSR with normal Qtc (422 on 09/04/21)  MME: ~31.7    I have reviewed the [facility] Medical Board statement on use of controlled substances for the treatment of pain as well as the CDC Guideline for Prescribing Opioids for Chronic Pain. I have reviewed the [place] Controlled Substance Monitoring Database.      Return for follow up before paternity leave please, please also schedule TPIs on the same day.      PLAN:  Today I have prescribed:  Requested Prescriptions     Signed Prescriptions Disp Refills    oxyCODONE (ROXICODONE) 10 mg immediate release tablet 180 tablet 0     Sig: Take 1 tablet (10 mg total) by mouth every four (4) hours as needed for pain. Fill on or after: 01/01/22       Orders Placed This Encounter   Procedures    Celiac Plexus Symp Nerve Block [id number])     Celiac block, Dr [name], quad, will need driver and PIV  Not on blood thinners  01/08/22 please     Standing Status:   Future     Standing Expiration Date:   01/02/2023    Trigger Point Injs 3+ Muscles [phone])     TPIs, Dr [name], quad, no driver or PIV needed  Not on blood thinner  Please schedule at same time as clinic follow up visit     Standing Status:   Future     Standing Expiration Date:   01/02/2023        HPI  [name] [name] is a 21 y.o. being followed at [facility] clinic for complaint of chronic pain localized to her shoulders and abdomen.      Patient was last seen on 12/04/2021, at which time the patient presented with worsened pain over the 4 months prior to previous visit. She was unable to tolerate Cymbalta, noting short-lived benefit but significantly distressing side effects including severe changes in her mood.  We elected to stop Cymbalta. We deferred nortriptyline given her sensitivities to the side effects of amitriptyline and Cymbalta.  However, given her concern that her GI absorption was less efficient in the setting of her abdominal tumors, her frequent pain flares, and the visceral nature of her abdominal pain we transitioned from oxycodone to a Butrans patch. Also recommended that the patient restart mirtazapine as she had found this helpful for appetite as well as reinitiation of gabapentin to help with her sleep and control her abdominal pain.  Patient noted that gabapentin had been mildly beneficial for the control of abdominal pain in the past.    Today, she reports she reports worsening abdominal pain following her recent hospital admission. Her pain is primarily located in LLQ, but she endorses new pain in the RLQ. She reported benefit from current pain medication regiment and denies any adverse effects. She is in clinic for TPIs of her right shoulder. She reported that the previous TPIs provided significant relief and she is better able to tolerate the TPIs now. We discussed celiac nerve block today and she expressed a desire to continue with that procedure for potential relief of abdominal pain.        Current view: Showing all answers           [facility] Patient-Entered Hpi Selection Questionnaire       Question 01/01/2022 10:10 AM EDT - Ceasar Mons by Patient    What is the primary reason for your visit? Abdominal Pain    Your abdominal pain is a... chronic problem    When did you first notice your abdominal pain? More than 1 month ago    How would you describe the start of your abdominal pain? Gradual    How often do you feel abdominal pain? Constantly    When you have abdominal pain, how long does it last? 5 Months    Since you first noticed your abdominal pain, how has it changed? Rapidly worsening    Where on the abdomen do you feel the most pain? Lower left belly     Lower right belly    On a scale of 0 to 10 (10 being the worst), how severe is your abdominal pain? 8    How would you describe your abdominal pain? Cramping and squeezing     Sharp     Tearing    Where does your abdominal pain spread? Lower left belly     Lower right belly    Are you experiencing any of the following symptoms with your abdominal pain?     No appetite Yes    Joint pain No    Burping No    Constipation No    Diarrhea Yes    Painful urination No    Fever No    Excessive gas No    Frequent urination No    Headaches No    Bloody stool Yes    Bloody urine No    Black, tar-like stool No    Muscle pain No    Nausea Yes  Weight loss Yes    Vomiting Yes    What makes your abdominal pain worse? Bowel movement     Certain positions     Eating     Movement    What relieves your abdominal pain? Certain positions     Sitting up    Have you had any of these recently? CT scan     Visit with a stomach specialist     Colonoscopy          Current analgesic regimen:  - Gabapentin 100 mg bedtime with gradual increase to 300 mg nightly   - Butrans patch 10 mcg/hr  - Voltaren 1% gel    Previous Medication Trials:  NSAIDS: N/A  Antidepressant/anxiety: Cymbalta (stopped due to mood side effects), amitriptyline (insomnia and increased J-pouch output)  Anticonvulsants: Gabapentin  Muscle relaxants: Robaxin  Topicals: lidocaine, coltaren gel  Short-acting opiates: oxycodone  Long-acting opiates:   Other: Tylenol    Previous Interventions  Procedures:   - TPIs in August 2023 with initial pain flare, subsequent improvement, and near immediate relief flare of pain; hyperalgesic with procedure  - TPIs of Right trapezius, rhomboid, infraspinatus on 12/04/2021.    PT: Yes    Allergies  Allergies   Allergen Reactions    Adhesive Tape-Silicones Hives and Rash     Paper tape  And tegederm ok    Ferrlecit [Sodium Ferric Gluconat-Sucrose] Swelling and Rash    Methylnaltrexone      Per Patient: I lost bowel control, severe abdominal cramping, and elevated BP    Neomycin Swelling     Rxn after ear drops; ear swelling    Papaya Hives    Morphine Nausea And Vomiting    Zosyn [Piperacillin-Tazobactam] Itching and Rash     Red and itchy    Compazine [Prochlorperazine] Other (See Comments)     Extreme agitation    Latex, Natural Rubber Rash    Opioids - Morphine Analogues Itching and Rash     Just morphine other opioids are fine       Home Medications    Current Outpatient Medications   Medication Sig Dispense Refill    acetaminophen (TYLENOL) 500 MG tablet Take 2 tablets (1,000 mg total) by mouth two (2) times a day.      baclofen (LIORESAL) 5 mg Tab tablet Take 1-2 tablets (5-10 mg total) by mouth two (2) times a day as needed for muscle spasms. 40 tablet 0    cefdinir (OMNICEF) 300 MG capsule Take 1 capsule (300 mg total) by mouth Two (2) times a day. 180 capsule 1    clobetasoL (CLOBEX) 0.05 % shampoo Apply topically two (2) times a day. 118 mL 0    COURIERED MED OR SUPPLY [id number], SORAFENIB 200MG  TABLET 1 each 0    diclofenac sodium (VOLTAREN) 1 % gel Apply 2 g topically four (4) times a day. 100 g 0    famotidine (PEPCID) 20 MG tablet Take 1 tablet (20 mg total) by mouth nightly. 90 tablet 3    gabapentin (NEURONTIN) 100 MG capsule Take 100mg  in the morning and afternoon. Take 300 mg at night. 120 capsule 3    hydrocortisone 2.5 % cream Apply 1 application. topically daily as needed.      HYDROmorphone (DILAUDID) 2 MG tablet Take 1.5 tablets (3 mg total) by mouth every six (6) hours for 14 days. 84 tablet 0    HYDROmorphone (DILAUDID) 2 MG tablet Take 1.5 tablets (3 mg total) by mouth every  three (3) hours as needed for moderate pain or severe pain. 120 tablet 0    hydrOXYzine (ATARAX) 25 MG tablet Take 1 tablet (25 mg total) by mouth every six (6) hours as needed for itching or anxiety (also insomnia). 20 tablet 0    lidocaine (LIDODERM) 5 % patch Place 1 patch on the skin daily. Apply to affected area for 12 hours only each day (then remove patch)      menthol-zinc oxide (CALMOSEPTINE) 0.44-20.6 % Oint Apply topically four (4) times a day as needed. 113 g 11    mirtazapine (REMERON) 7.5 MG tablet Take 1 tablet (7.5 mg total) by mouth nightly. 90 tablet 3    naloxone (NARCAN) 4 mg nasal spray One spray in either nostril once for known/suspected opioid overdose. May repeat every 2-3 minutes in alternating nostril til EMS arrives 2 each 0    OLANZapine (ZYPREXA) 2.5 MG tablet Take 1 tablet (2.5 mg total) by mouth nightly. 30 tablet 0    ondansetron (ZOFRAN-ODT) 8 MG disintegrating tablet Take 1 tablet (8 mg total) by mouth every twelve (12) hours as needed for nausea.      oxyCODONE (ROXICODONE) 10 mg immediate release tablet Take 1 tablet (10 mg total) by mouth every four (4) hours as needed for pain. Fill on or after: 01/01/22 180 tablet 0    SORAfenib (NEXAVAR) 200 mg tablet Take 2 tablets (400 mg total) by mouth daily. 60 tablet 2    tacrolimus (PROTOPIC) 0.1 % ointment Apply 1 application. topically Two (2) times a day. Apply two times a day to the face 100 g 0    triamcinolone (KENALOG) 0.025 % ointment Apply two times a day as needed to the body for itchy skin or rash. 30 g 0    triamcinolone (KENALOG) 0.1 % cream Apply topically two (2) times a day. 28 g 3    zinc oxide 10 % Crea Apply 1 application. topically daily as needed.       No current facility-administered medications for this visit.       ROS  See questionnaire above    Answers submitted by the patient for this visit:  Abdominal Pain Questionnaire (Submitted on 01/01/2022)  Chief Complaint: Abdominal pain  Chronicity: chronic  Onset: more than 1 month ago  Onset quality: gradual  Frequency: constantly  Episode duration: 5 Months  Progression since onset: rapidly worsening  Pain location: LLQ, RLQ  Pain - numeric: 8/10  Pain quality: cramping, sharp, tearing  Radiates to: LLQ, RLQ  anorexia: Yes  arthralgias: No  belching: No  constipation: No  diarrhea: Yes  dysuria: No  fever: No  flatus: No  frequency: No  headaches: No  hematochezia: Yes  hematuria: No  melena: No  myalgias: No  nausea: Yes  weight loss: Yes  vomiting: Yes  Aggravated by: bowel movement, certain positions, eating, movement  Relieved by: certain positions, sitting up  Diagnostic workup: CT scan, GI consult, lower endoscopy      Physical Exam    VITALS:   Vitals:    01/01/22 1119   BP: 109/67   Pulse: 85   Resp: 16   Temp: 36.8 ??C (98.2 ??F)   SpO2: 99%         Wt Readings from Last 3 Encounters:   12/31/21 52.2 kg (115 lb 1.6 oz)   12/26/21 52.2 kg (115 lb)   12/19/21 50.7 kg (111 lb 12.8 oz)     GENERAL:  The patient is  well developed, well-nourished and appears to be in no apparent distress. The patient is pleasant and interactive. Patient is a good historian.  HEAD/NECK:    Reveals normocephalic/atraumatic. clear sclera. Mucous membranes are moist.  HEART: RRR, no MRG  LUNGS:   Normal work of breathing  EXTREMITIES:  No clubbing, cyanosis noted.  NEUROLOGIC:    The patient was alert and oriented, speech fluent, normal language. CN 2-12 grossly intact.  MUSCULOSKELETAL:    Motor function  4/5 in all extremities with normal tone and bulk. Good range of motion of all extremities. The patient was able to ambulate without difficult throughout the clinic today without the assistance of a walking aid.   REFLEXES:  Reflexes were symmetric  in all four extremities.   SKIN:   No obvious rashes, lesions, or erythema.  PSY:  Appropriate affect

## 2021-12-31 NOTE — Unmapped (Signed)
Adolescent and Young Adult Cancer Program Visit     Service date: December 31, 2021    Encounter location: Multidisciplinary Clinic    Clinician: [name], LCSW - AYA Clinical Social Worker    Patient identifiers: [name] [name] is a 21 y.o. with desmoid tumors. [name] [name] uses she/her pronouns.     Additional visit participants: pt's mom, Dr. [name], Dr. Merlene Morse    Visit Summary     Met with the patient for evaluation of needs that may be addressed by [facility].    [name] [name] has really been struggling since her last hospitalization with continued significant pain with both eating and NG tube feeds.     Provided emotional support and exploration of concerns during the visit with Dr. [name] and Dr. Merlene Morse.    Follow Up/Plan:     Made plan to talk via video for counseling this Friday.    I will continue to follow this patient for AYA-appropriate support. They have my contact information and have been encouraged to contact me as needed.       12/31/2021     [name], LCSW  Adolescent and Young Adult Clinical Social Worker  Phone: [phone]  Email: [email or web address]

## 2021-12-31 NOTE — Unmapped (Unsigned)
Labs drawn via butterfly & sent for analysis.  To next appt.  Care provided by ES.

## 2022-01-01 ENCOUNTER — Ambulatory Visit
Admit: 2022-01-01 | Discharge: 2022-01-02 | Payer: PRIVATE HEALTH INSURANCE | Attending: Student in an Organized Health Care Education/Training Program | Primary: Student in an Organized Health Care Education/Training Program

## 2022-01-01 DIAGNOSIS — G894 Chronic pain syndrome: Principal | ICD-10-CM

## 2022-01-01 DIAGNOSIS — D48119 Desmoid tumor of unspecified site: Secondary | ICD-10-CM | POA: Diagnosis not present

## 2022-01-01 DIAGNOSIS — M7918 Myalgia, other site: Principal | ICD-10-CM

## 2022-01-01 DIAGNOSIS — R1030 Lower abdominal pain, unspecified: Principal | ICD-10-CM

## 2022-01-01 DIAGNOSIS — Q8789 Other specified congenital malformation syndromes, not elsewhere classified: Principal | ICD-10-CM

## 2022-01-01 DIAGNOSIS — R1084 Generalized abdominal pain: Principal | ICD-10-CM

## 2022-01-01 MED ORDER — OXYCODONE 10 MG TABLET
ORAL_TABLET | ORAL | 0 refills | 30 days | Status: CP | PRN
Start: 2022-01-01 — End: 2022-01-31

## 2022-01-01 NOTE — Unmapped (Signed)
[facility]   Medical Nutrition Therapy Consultation       Visit Type:    Initial Assessment    Referral Reason:   Follow-up with tube feeding       [name] [name] is a 21 y.o. 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 S/p robotic assisted laparoscopic proctocolectomy, ileal J pouch, with ostomy 08/2020. Ileostomy take down 10/09/20. Hospitalization for abdominal pain, malnutrition 9/28-10/13/23 Pouchoscopy 10/4 without stricture. Initiated on tube feeds. Sorafenib was initiated ~ 10/3 and tolerated reasonably well in house. She is seen for medical nutrition therapy to follow-up on tube feeding tolerance and po intake. Her active problem list, medication list, allergies, social history, notes from last a few encounters, and lab results were reviewed.     Past Medical History:   Diagnosis Date    Abdominal pain     Acid reflux     occas    Anesthesia complication     itching, shaking, coldness; last few surgeries have gone much better    Cataract of right eye     COVID-19 virus infection 01/2019    Cyst of thyroid determined by ultrasound     monitoring    [name]     2 right forearm, 1 left thigh, 1 right scapula, 1 under left clavicle; multiple    Difficult intravenous access     [name] (familial adenomatous polyposis)     [name]     Gastric polyps     History of chemotherapy     last treatment approx 05/2019    History of colon polyps     History of COVID-19 01/2019    Iron deficiency anemia due to chronic blood loss     received iron infusion 11-2019    PONV (postoperative nausea and vomiting)     Rectal bleeding     Syncopal episodes     especially if becoming dehydrated       Anthropometrics   Height: 165.1 cm (5' 5)    Current Weight: 52.2 kg (115 lbs 1.6 oz)  BMI: 19.20 kg/m??   Usual Body Weight:  118-120 lb per pt  Recent Wt change:1.5 kg (~3%) wt increase in the past 2 weeks from 10/12-10/24/23.     Weight history:  Wt Readings from Last 6 Encounters:   12/31/21 52.2 kg (115 lb 1.6 oz)   12/26/21 52.2 kg (115 lb)   12/19/21 50.7 kg (111 lb 12.8 oz)   12/03/21 51.6 kg (113 lb 12.8 oz)   10/23/21 52.3 kg (115 lb 6.4 oz)   10/02/21 53.1 kg (117 lb)       Nutrition Risk Screening:   Food Insecurity: No Food Insecurity (08/30/2021)    Hunger Vital Sign     Worried About Running Out of Food in the Last Year: Never true     Ran Out of Food in the Last Year: Never true        Nutrition-Focused Physical Findings       Physical Activity:  Physical activity level is sedentary with little to no exercise.  Pt is too weak to do more than ADLs      ASPEN/AND Malnutrition Screening:   Pt meets ASPEN/AND criteria for moderate protein-calorie malnutrition in context of acute illness or injury.  -- Weight loss: recent 3% increase in 2 weeks   -- Energy Intake: <75% EER for >7 days  -- Muscle Mass: mild loss (temple, clavicle, acromion)  -- Fat  Mass: mild loss (orbital buccal)  -- Fluid Accumulation: none noted   -- Functional Assessment: not assessed    Biochemical Data, Medical Tests and Procedures:  All pertinent labs and imaging reviewed by Renda Rolls Trease Bremner, RD/LDN at 1:40 PM 12/31/2021.      Medications and Vitamin/Mineral Supplementation:   All nutritionally pertinent medications reviewed on 12/31/2021.   Nutritionally pertinent medications include: as noted in current medication list below  She is not taking nutrition supplements.     Current Outpatient Medications   Medication Sig Dispense Refill    acetaminophen (TYLENOL) 500 MG tablet Take 2 tablets (1,000 mg total) by mouth two (2) times a day.      baclofen (LIORESAL) 5 mg Tab tablet Take 1-2 tablets (5-10 mg total) by mouth two (2) times a day as needed for muscle spasms. 40 tablet 0    cefdinir (OMNICEF) 300 MG capsule Take 1 capsule (300 mg total) by mouth Two (2) times a day. 180 capsule 1    clobetasoL (CLOBEX) 0.05 % shampoo Apply topically two (2) times a day. 118 mL 0    COURIERED MED OR SUPPLY [id number], SORAFENIB 200MG  TABLET 1 each 0    diclofenac sodium (VOLTAREN) 1 % gel Apply 2 g topically four (4) times a day. 100 g 0    famotidine (PEPCID) 20 MG tablet Take 1 tablet (20 mg total) by mouth nightly. 90 tablet 3    gabapentin (NEURONTIN) 100 MG capsule Take 100mg  in the morning and afternoon. Take 300 mg at night. 120 capsule 3    hydrocortisone 2.5 % cream Apply 1 application. topically daily as needed.      HYDROmorphone (DILAUDID) 2 MG tablet Take 1.5 tablets (3 mg total) by mouth every six (6) hours for 14 days. 84 tablet 0    HYDROmorphone (DILAUDID) 2 MG tablet Take 1.5 tablets (3 mg total) by mouth every three (3) hours as needed for moderate pain or severe pain. 120 tablet 0    hydrOXYzine (ATARAX) 25 MG tablet Take 1 tablet (25 mg total) by mouth every six (6) hours as needed for itching or anxiety (also insomnia). 20 tablet 0    lidocaine (LIDODERM) 5 % patch Place 1 patch on the skin daily. Apply to affected area for 12 hours only each day (then remove patch)      menthol-zinc oxide (CALMOSEPTINE) 0.44-20.6 % Oint Apply topically four (4) times a day as needed. 113 g 11    mirtazapine (REMERON) 7.5 MG tablet Take 1 tablet (7.5 mg total) by mouth nightly. 90 tablet 3    naloxone (NARCAN) 4 mg nasal spray One spray in either nostril once for known/suspected opioid overdose. May repeat every 2-3 minutes in alternating nostril til EMS arrives 2 each 0    OLANZapine (ZYPREXA) 2.5 MG tablet Take 1 tablet (2.5 mg total) by mouth nightly. 30 tablet 0    ondansetron (ZOFRAN-ODT) 8 MG disintegrating tablet Take 1 tablet (8 mg total) by mouth every twelve (12) hours as needed for nausea.      oxyCODONE (ROXICODONE) 5 MG immediate release tablet Take 2 tablets (10 mg total) by mouth every four (4) hours as needed for pain for up to 1 day. Will see pain clinic MD tomorrow for next Rx 6 tablet 0    SORAfenib (NEXAVAR) 200 mg tablet Take 2 tablets (400 mg total) by mouth daily. 60 tablet 2    tacrolimus (PROTOPIC) 0.1 % ointment Apply 1 application. topically Two (2) times  a day. Apply two times a day to the face 100 g 0    triamcinolone (KENALOG) 0.025 % ointment Apply two times a day as needed to the body for itchy skin or rash. 30 g 0    triamcinolone (KENALOG) 0.1 % cream Apply topically two (2) times a day. 28 g 3    zinc oxide 10 % Crea Apply 1 application. topically daily as needed.       No current facility-administered medications for this visit.       Nutrition History:     Dietary Restrictions: She reported food allergies to papaya and IV iron (Ferrlecit)    Gastrointestinal Issues: Diarrhea - 15 BM's per day at present; Heartburn- Reflux at night; Abdominal pain with po diet and tube feedings     Oral Issues: dry mouth    Hunger and Satiety: Poor appetite and limited intake.   Early satiety.    Food Safety and Access: No to little issues noted.  She did not report issues.     Other: pain      Diet Recall:   Bites of pasta, guacamole and banana  Beverages: sips of sweet tea - has had only ~8 oz of sweet tea so far today.  Supplements: none at present  Enteral feedings:  Discharge tube feeding planned to meet 75% of EER with po providing balance. Cycled feeding with Osmolite 1.5 at 50 mL / hr X 16 hrs daily. 30 mL FWF before and after tube feeding. Pt reports that she often does not complete the feeding or skips it if she has abdominal pain.     Has been on TPN in the past.    Nutrition Assessment       Pt meeting <75% of caloric and protein needs with primarily enteral nutrition via post pyloric NG tube feedings and minimal po diet. Fluid intake is suboptimal.    If pt is unable to advance po and enteral nutrition in the next 7-10 days then consider bowel rest with TPN for alternative nutrition support.    Daily Estimated Nutrition Needs:  Calories: 30-35 kcals/ kg using actual wt  Protein: 1.2-1.5 g/ kg using actual wt  Carbohydrate: no restriction   Fluid: 1 mL/ kcal, maintenance  (Holliday Segar) Standard multivitamin for adults to meet 100% RDI for vitamins and minerals if pt is not able to eat a regular diet or tolerate >3 Osmolite 1.5 enteral formula or comparable per day      Nutrition Goals & Evaluation      -Tolerate tube feeding at goal rate (Not Met and Ongoing)  - Patient to consume >= 50% of po intake via combination of meals, snacks, and/or oral supplements (Not Met and Ongoing)  - Adequate fluid intake  (New and Ongoing)    Nutrition goals reviewed, and relevant barriers identified and addressed: acuity of illness. She is evaluated to have fair to poor willingness and ability to achieve nutrition goals.     Nutrition Intervention      - Nutrition Counseling: Motivational Interviewing Goal setting Problem solving  - Enteral Nutrition    Nutrition Plan:   Trial a less concentrated 1.0 cal enteral formula (suggested use Ensure or Boost Original that she can purchase at the grocery store) at 50 mL/ hr X 16 hours with 30 mL FWF before and after each feeding.  Continue po diet of soft, bland and easy to digest foods  Continue to sip fluids throughout the day aiming to get at  least 5 cups of additional fluid po or via g-tube as tolerated to meet fluid needs.    Materials Provided:  None given today    Follow up will occur in 7-10 days, or prn per pt preference..       Food/Nutrition-related history, Anthropometric measurements, Biochemical data, medical tests, procedures, Nutrition-focused physical findings, and Effectiveness of nutrition interventions will be assessed at time of follow-up.     Recommendations for Clinical Team, Care Team , and Referring Provider :  Encourage pt to follow nutrition plan as above.  Recommend TPN for alternative nutrition support if pt is unable to advance po and enteral nutrition in the next 7-10 days.        Time spent 25 minutes

## 2022-01-01 NOTE — Unmapped (Signed)
Trigger Point Injection of 3+ muscle groups: Right trapezius, rhomboid, infraspinatus   Pre-operative diagnosis: myofascial pain  Post-operative diagnosis: Same    Attending Physician: Dr. Darlis Loan  Assistant: Dr. Trude Mcburney     21 y.o. 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 [facility] related to abdominal and right shoulder pain in the setting of her desmoid tumors. She is active in PT and on a multimodal pain regimen that includes PRN oxycodone, which she is trying to wean off of. We will attempt TPIs today to improve her pain control and help wean oxycodone. Her physical exam on 10/02/21 was concerning for peri-scapular trigger points, likely due to compensatory strain on the muscles in the setting of scapular winging due to the desmoid tumor.     Please see same-day clinic note for more details regarding her interval medical history; patient notes that she tends to get pain flares after her trigger point injections that last about 1 day.  She has tolerated Toradol in the past and, while she is not on long-term NSAIDs given the risk for GI bleeds, she has been told that it is okay to have small short duration courses.  Creatinine normal, platelets normal.  Will add IM Toradol to help with post procedural pain flare.  Patient notes approximately 80% relief in her right shoulder region from prior TPI's.  We will schedule for repeats in approximately 1 month.    Serial consent signed on 10/16/21    After risks and benefits were explained including bleeding, infection, worsening of the pain, damage to the area being injected, weakness, allergic reaction to medications, vascular injection, pneumothorax, nerve damage, and the possibility of worsening desmoid tumor symptoms/growths (though I noted with them that I was unable to find literature indicating that injections of healthy muscle in patients with desmoid tumors triggers new desmoid tumor growth), signed consent was obtained.  All questions were answered.   The patient is not taking antiplatelet or anticoagulation medications and does  have a driver today.    The area of the trigger point was identified and the skin prepped with Chloraprep.  A 27 gauge 0.5 inch needle was placed in the area of the trigger point.  Dry needling was performed in the area of the trigger point.  Once reproduction of the pain was elicited and negative aspiration confirmed, the trigger point was injected with 1 mL of a mixture of 8 mL of 0.25% bupivacaine and 1 mL of ketorolac 15 mg/mL and the needle was removed.      The patient did tolerate the procedure well and there were not complications.      Trigger points injected: 9    Trigger point locations:  Right trapezius, rhomboid, infraspinatus     The patient was monitored for 15 minutes after the procedure.  Vital signs remained normal and the patient ambulated out of clinic.    Pre-procedure pain score: 7/10  Post-procedure pain score: 9/10    DISPO:    Pt to follow up in 4-6 weeks for combined TPIs and clinic visit

## 2022-01-01 NOTE — Unmapped (Unsigned)
Hematology Consult Note    Referring Physician :  Jarold Motto, [facility]    Primary Care Physician:   Jarold Motto, [facility]    Other Consulting Physicians:  Dr. [name]    Reason for Consult:  New VTE    Assessment/Recommendations:   [name] [name] is a 21 y.o. caucasian female with history of [name], syncope, desmoid tumors, s/p colectomy, with severe malnutrition on TPN,  who we are seeing in consultation at the request of Jarold Motto for further evaluation and management of DVT and superficial vein thrombosis.    1.  DVT, Superficial Vein Thrombosis: History of 1 line-associated DVT and subsequent residual superficial vein thrombus.   See HPI below for detailed history.  Imaging on POCUS on 03/13/21 by Dr. Selena Batten showed RUE brachial vein thrombus associated with PICC line, per note.  The PICC removed same day and due to persistent symptoms, she then had RUE PVL on 03/19/21 which showed continued obstruction in the basilic vein (residual thrombus at isolated segment).  VTE risk factors include: 1) Prolonged Hospital Stay, 2) PICC line.  Started on therapy with Eliquis 5mg  BID which has been well tolerated with exception of persistent uterine bleeding.     At prior visit, we reviewed anticoagulation algorithms including those provided by [facility], though she does not have a typical malignancy.  We decided to continue her anticoagulation given her persistent and symptomatic superficial thrombus in the basilic vein.  Due to her risk of bleeding, she completed 6 weeks of Eliquis 2.5mg  BID and then continued until her catheter was removed.  She has now had her central line removed and has recovered from her superficial vein thrombus.  No further anticoagulation planned at this time.     2.  Iron Deficiency Anemia: History of iron deficiency anemia likely due to uterine bleeding, possible impaired absorption due to abdominal disease, and GI bleeding.  Last received IV iron in Fall 2022, ferritin was 8.1. Repeat iron labs on 04/15/21 with ferritin of 15 and iron sat 10%. Hemoglobin was 12.1, MCV low in the low 80s.  Discussed options and pursued ferrlecit 750mg  over 3 doses.  She tolerated initial doses well but the third dose caused severe side effects including extremity swelling, difficulty breathing, and urticaria, for which she proceeded to the ED and was given steroids and benadryl.  We discussed today that re-challenging her with Ferrlecit would have risks, but that we could pursue [facility].  However, the premeds required over multiple infusions would become challenging.  For this reason, we discussed giving InFed instead, which is a higher risk medication, but she has never tried, and which requires only one infusion and we could premed her for this as follows:  - Prednisone 50mg  at 13h, 7h, and 1h prior to infusion.  - Benadryl 50mg  at 1h prior to infusion  - Pepcid 40mg , 1h prior to infusion  - Tylenol 650mg     We discussed that there is no perfect option should she need IV iron in the future, but that this premedication protocol may prevent recurrent reactions.  We will [facility] and will watch her for at least 30 minutes following infusion.  Fortunately, her recent hemoglobin was high at 16.0 suggesting robust response to her IV iron, and her ferritin is now 56.6 with normal iron and iron sat.  No plans for additional IV iron at this time.    Plans:  -  Remain off anticoagulation.  -  IV InFed in the future as needed, with [facility] premedication protocol, as above.  -  Repeat labs today: Iron and TIBC, Ferritin, Retic.  -  Follow-up in 63mo.    This case was discussed with Dr. Isaiah Serge.      History of Present Illness:   [name] [name] is a 21 y.o. caucasian female with history of [name], syncope, desmoid tumors, s/p colectomy, with severe malnutrition on TPN, who we are seeing in consultation at the request of Dr. Bufford Buttner for had a lot of pain, shooting down to her pinky, and bleeding from the PICC site.  She saw Dr. Selena Batten in IR on 03/13/20 and he noted a brachial vein thrombus on POCUS.  He pulled the PICC line, recommended a tunneled line which was placed on 03/14/21.  She saw Dr. [name] on 03/19/21 and felt the RUE swelling was worse and she feels like there is extra fluid around her neck and chest.  She had a PVL which showed basilic vein thrombus. She was started on Eliquis 5mg  BID.  She feels this has gotten better in the past several days. She has been having uterine bleeding since starting the Eliquis, it is now over a week.  This is variable.     She had some shortness of breath on 03/27/21 and had CTA which was negative.  She notes she is also fluid up possibly from TPA.  She further denies palpitations, but does have lightheadedness and syncope.  She denies abnormal bruising or unusual bleeding including heavy menses, mucosal bleeding or GI bleeding (melena, hematochezia).  Notes that she would not notice possible GI bleeding.  She does not currently take regular NSAIDS or aspirin.  She reports lower extremities are equal in size.    [name] [name] received ferrlecit and reports a reaction with difficulty breathing and received benadryl and steroid at [facility] ED in March 2023. She states when she got up from the chair she noticed numbness in her feet, then nerve tingling everywhere.  Then when she got home it didn't feel better and her home nurse came to change her central line dressing and her hands were very swollen and wouldn't bend and she noticed hives forming in the back of her legs and feet, feet were also swollen.  When she felt neck swelling and shortness of breath she went to the ED. Even after the injected steroid she continued swelling.  She was discharged on prednisone and benadryl.  It continued to hurt to walk.  This resolved over time.  She has also had her CVAD removed and is off Eliquis now.  She had a line infiltrate on the she went to the ED. Even after the injected steroid she continued swelling.  She was discharged on prednisone and benadryl.  It continued to hurt to walk.  This resolved over time.  She has also had her CVAD removed and is off Eliquis now.  She had a line infiltrate on the left about a week or two ago and has some bruising.  She had a two week period a few weeks ago and has been spotting since then. She is eating more and is off TPN.     Interval History:  Since last visit, [name] [name] has not had additional IV iron.     Previous Clots: 1    Clot 1: RUE DVT, Superficial Vein Thrombosis, RUE, continued obstruction in the basilic vein (residual thrombus at isolated segment), RUE Brachial  vein thrombus, noted only on POCUS 03/13/21, in setting of PICC line,  03/19/21  presented with swelling, erythema and extremity pain  Risk Factors:  1) Prolonged Hospital Stay, 2) PICC line  Anticoagulant: Yes: Eliquis.  Well Tolerated?: Yes.    Relevant General Clotting History:  Previous Pregnancies: No/ none.,   Contraceptive Use: none, brief depo shot, clotting during prior OCP use? No/ none.  History of Trauma/Surgeries: Yes: several, no VTE.  Family History of Clots: no   Parents?: yes, without VTEs   Children?: 0  Siblings?:   Sisters: 1, without VTE  Brothers: 1 half, without VTE  Aunts/Uncles?: 3, no known VTE  Smoking History?: non-smoker  Known Thrombophilia: No/ none.  BMI: There is no height or weight on file to calculate BMI.      Visit Diagnoses:  No diagnosis found.  Past Medical History:   Diagnosis Date    Abdominal pain     Acid reflux     occas    Anesthesia complication     itching, shaking, coldness; last few surgeries have gone much better    Cataract of right eye     COVID-19 virus infection 01/2019    Cyst of thyroid determined by ultrasound     monitoring    [name]     2 right forearm, 1 left thigh, 1 right scapula, 1 under left clavicle; multiple    Difficult intravenous access     [name] (familial adenomatous polyposis)     [name]     Gastric polyps     History of chemotherapy     last treatment approx 05/2019    History of colon polyps     History of COVID-19 01/2019    Iron deficiency anemia due to chronic blood loss     received iron infusion 11-2019    PONV (postoperative nausea and vomiting)     Rectal bleeding     Syncopal episodes     especially if becoming dehydrated       Medical History:   Past Medical History:   Diagnosis Date    Abdominal pain     Acid reflux     occas    Anesthesia complication     itching, shaking, coldness; last few surgeries have gone much better    Cataract of right eye     COVID-19 virus infection 01/2019    Cyst of thyroid determined by ultrasound     monitoring    [name]     2 right forearm, 1 left thigh, 1 right scapula, 1 under left clavicle; multiple    Difficult intravenous access     [name] (familial adenomatous polyposis)     [name]     Gastric polyps     History of chemotherapy     last treatment approx 05/2019    History of colon polyps     History of COVID-19 01/2019    Iron deficiency anemia due to chronic blood loss     received iron infusion 11-2019    PONV (postoperative nausea and vomiting)     Rectal bleeding     Syncopal episodes     especially if becoming dehydrated        Surgical History:  Past Surgical History:   Procedure Laterality Date    cyroablation      cystis removal      desmoid removal      PR CLOSE ENTEROSTOMY,RESEC+ANAST N/A 10/09/2020    Procedure:  ILEOSTOMY TAKEDOWN;  Surgeon: Mickle Asper, MD;  Location: OR [place];  Service: General Surgery    PR COLONOSCOPY W/BIOPSY SINGLE/MULTIPLE N/A 10/27/2012    Procedure: COLONOSCOPY, FLEXIBLE, PROXIMAL TO SPLENIC FLEXURE; WITH BIOPSY, SINGLE OR MULTIPLE;  Surgeon: Shirlyn Goltz Mir, MD;  Location: PEDS PROCEDURE ROOM [facility];  Service: Gastroenterology    PR COLONOSCOPY W/BIOPSY SINGLE/MULTIPLE N/A 09/14/2013    Procedure: COLONOSCOPY, FLEXIBLE, PROXIMAL TO SPLENIC FLEXURE; WITH BIOPSY, SINGLE OR MULTIPLE;  Surgeon: Shirlyn Goltz Mir, MD;  Location: PEDS PROCEDURE ROOM [facility];  Service: Gastroenterology    PR COLONOSCOPY W/BIOPSY SINGLE/MULTIPLE N/A 11/08/2014    Procedure: COLONOSCOPY, FLEXIBLE, PROXIMAL TO SPLENIC FLEXURE; WITH BIOPSY, SINGLE OR MULTIPLE;  Surgeon: Arnold Long Mir, MD;  Location: PEDS PROCEDURE ROOM [facility];  Service: Gastroenterology    PR COLONOSCOPY W/BIOPSY SINGLE/MULTIPLE N/A 12/26/2015    Procedure: COLONOSCOPY, FLEXIBLE, PROXIMAL TO SPLENIC FLEXURE; WITH BIOPSY, SINGLE OR MULTIPLE;  Surgeon: Arnold Long Mir, MD;  Location: PEDS PROCEDURE ROOM [facility];  Service: Gastroenterology    PR COLONOSCOPY W/BIOPSY SINGLE/MULTIPLE N/A 09/02/2017    Procedure: COLONOSCOPY, FLEXIBLE, PROXIMAL TO SPLENIC FLEXURE; WITH BIOPSY, SINGLE OR MULTIPLE;  Surgeon: Arnold Long Mir, MD;  Location: PEDS PROCEDURE ROOM [place];  Service: Gastroenterology    PR COLSC FLX W/REMOVAL LESION BY HOT BX FORCEPS N/A 08/27/2016    Procedure: COLONOSCOPY, FLEXIBLE, PROXIMAL TO SPLENIC FLEXURE; W/REMOVAL TUMOR/POLYP/OTHER LESION, HOT BX FORCEP/CAUTE;  Surgeon: Arnold Long Mir, MD;  Location: PEDS PROCEDURE ROOM [facility];  Service: Gastroenterology    PR COLSC FLX W/RMVL OF TUMOR POLYP LESION SNARE TQ N/A 02/25/2019    Procedure: COLONOSCOPY FLEX; W/REMOV TUMOR/LES BY SNARE;  Surgeon: Helyn Numbers, MD;  Location: [facility] PROCED[facility];  Service: Gastroenterology    PR COLSC FLX W/RMVL OF TUMOR POLYP LESION SNARE TQ N/A 03/13/2020    Procedure: COLONOSCOPY FLEX; W/REMOV TUMOR/LES BY SNARE;  Surgeon: Helyn Numbers, MD;  Location: [facility];  Service: Gastroenterology    PR EXC SKIN BENIG 2.1-3 CM TRUNK,ARM,LEG Right 02/25/2017    Procedure: EXCISION, BENIGN LESION INCLUDE MARGINS, EXCEPT SKIN TAG, LEGS; EXCISED DIAMETER 2.1 TO 3.0 CM;  Surgeon: Clarene Duke, MD;  Location: CHILDRENS OR [facility];  Service: Plastics    PR EXC SKIN BENIG 3.1-4 CM TRUNK,ARM,LEG Right 02/25/2017 Procedure: EXCISION, BENIGN LESION INCLUDE MARGINS, EXCEPT SKIN TAG, ARMS; EXCISED DIAMETER 3.1 TO 4.0 CM;  Surgeon: Clarene Duke, MD;  Location: CHILDRENS OR [facility];  Service: Plastics    PR EXC SKIN BENIG >4 CM FACE,FACIAL Right 02/25/2017    Procedure: EXCISION, OTHER BENIGN LES INCLUD MARGIN, FACE/EARS/EYELIDS/NOSE/LIPS/MUCOUS MEMBRANE; EXCISED DIAM >4.0 CM;  Surgeon: Clarene Duke, MD;  Location: CHILDRENS OR [facility];  Service: Plastics    PR EXC TUMOR SOFT TISSUE LEG/ANKLE SUBQ 3+CM Right 08/05/2019    Procedure: EXCISION, TUMOR, SOFT TISSUE OF LEG OR ANKLE AREA, SUBCUTANEOUS; 3 CM OR GREATER;  Surgeon: Arsenio Katz, MD;  Location: MAIN OR [place];  Service: Plastics    PR EXC TUMOR SOFT TISSUE LEG/ANKLE SUBQ <3CM Right 08/05/2019    Procedure: EXCISION, TUMOR, SOFT TISSUE OF LEG OR ANKLE AREA, SUBCUTANEOUS; LESS THAN 3 CM;  Surgeon: Arsenio Katz, MD;  Location: MAIN OR [facility];  Service: Plastics    PR LAP, SURG PROCTECTOMY W J-POUCH N/A 08/10/2020    Procedure: ROBOTIC ASSISTED LAPAROSCOPIC PROCTOCOLECTOMY, ILEAL J POUCH, WITH OSTOMY;  Surgeon: Mickle Asper, MD;  Location: OR [place];  Service: General Surgery    PR NDSC EVAL INTSTINAL  POUCH DX W/COLLJ SPEC SPX N/A 01/23/2021    Procedure: ENDO EVAL SM INTEST POUCH; DX;  Surgeon: Modena Nunnery, MD;  Location: [facility]ED[facility] [facility];  Service: Gastroenterology    PR NDSC EVAL INTSTINAL POUCH DX W/COLLJ SPEC SPX N/A 08/27/2021    Procedure: ENDO EVAL SM INTEST POUCH; DX;  Surgeon: Hunt Oris, MD;  Location: [facility]URES [facility];  Service: Gastroenterology    PR NDSC EVAL INTSTINAL POUCH DX W/COLLJ SPEC SPX N/A 12/09/2021    Procedure: ENDO EVAL SM INTEST POUCH; DX;  Surgeon: Vidal Schwalbe, MD;  Location: GI PROCEDURES [facility];  Service: Gastroenterology    PR UNLISTED PROCEDURE SMALL INTESTINE  01/23/2021    Procedure: UNLISTED PROCEDURE, SMALL INTESTINE;  Surgeon: Modena Nunnery, MD; Location: [facility] PROCEDURES [facility] [facility];  Service: Gastroenterology    PR UPPER GI ENDOSCOPY,BIOPSY N/A 10/27/2012    Procedure: UGI ENDOSCOPY; WITH BIOPSY, SINGLE OR MULTIPLE;  Surgeon: Shirlyn Goltz Mir, MD;  Location: PEDS PROCEDURE ROOM [facility];  Service: Gastroenterology    PR UPPER GI ENDOSCOPY,BIOPSY N/A 09/14/2013    Procedure: UGI ENDOSCOPY; WITH BIOPSY, SINGLE OR MULTIPLE;  Surgeon: Shirlyn Goltz Mir, MD;  Location: PEDS PROCEDURE ROOM [facility];  Service: Gastroenterology    PR UPPER GI ENDOSCOPY,BIOPSY N/A 11/08/2014    Procedure: UGI ENDOSCOPY; WITH BIOPSY, SINGLE OR MULTIPLE;  Surgeon: Arnold Long Mir, MD;  Location: PEDS PROCEDURE ROOM [facility];  Service: Gastroenterology    PR UPPER GI ENDOSCOPY,BIOPSY N/A 12/26/2015    Procedure: UGI ENDOSCOPY; WITH BIOPSY, SINGLE OR MULTIPLE;  Surgeon: Arnold Long Mir, MD;  Location: PEDS PROCEDURE ROOM [facility];  Service: Gastroenterology    PR UPPER GI ENDOSCOPY,BIOPSY N/A 08/27/2016    Procedure: UGI ENDOSCOPY; WITH BIOPSY, SINGLE OR MULTIPLE;  Surgeon: Arnold Long Mir, MD;  Location: PEDS PROCEDURE ROOM [facility];  Service: Gastroenterology    PR UPPER GI ENDOSCOPY,BIOPSY N/A 09/02/2017    Procedure: UGI ENDOSCOPY; WITH BIOPSY, SINGLE OR MULTIPLE;  Surgeon: Arnold Long Mir, MD;  Location: PEDS PROCEDURE ROOM [facility];  Service: Gastroenterology    PR UPPER GI ENDOSCOPY,BIOPSY N/A 03/13/2020    Procedure: UGI ENDOSCOPY; WITH BIOPSY, SINGLE OR MULTIPLE;  Surgeon: Helyn Numbers, MD;  Location: [facility] [facility];  Service: Gastroenterology    PR UPPER GI ENDOSCOPY,BIOPSY N/A 09/05/2021    Procedure: UGI ENDOSCOPY; WITH BIOPSY, SINGLE OR MULTIPLE;  Surgeon: Wendall Papa, MD;  Location: GI PROC[facility]URES [facility];  Service: Gastroenterology    TUMOR REMOVAL      multiple-head, neck, back, hand, right flank, multiple         Medications:   Current Outpatient Medications   Medication Sig Dispense Refill    acetaminophen (TYLENOL) 500 MG tablet Take 2 tablets (1,000 mg total) by mouth two (2) times a day.      baclofen (LIORESAL) 5 mg Tab tablet Take 1-2 tablets (5-10 mg total) by mouth two (2) times a day as needed for muscle spasms. 40 tablet 0    cefdinir (OMNICEF) 300 MG capsule Take 1 capsule (300 mg total) by mouth Two (2) times a day. 180 capsule 1    clobetasoL (CLOBEX) 0.05 % shampoo Apply topically two (2) times a day. 118 mL 0    COURIERED MED OR SUPPLY [id number], SORAFENIB 200MG  TABLET 1 each 0    diclofenac sodium (VOLTAREN) 1 % gel Apply 2 g topically four (4) times a day. 100 g 0    famotidine (PEPCID) 20 MG tablet Take 1 tablet (20  mg total) by mouth nightly. 90 tablet 3    gabapentin (NEURONTIN) 100 MG capsule Take 100mg  in the morning and afternoon. Take 300 mg at night. 120 capsule 3    hydrocortisone 2.5 % cream Apply 1 application. topically daily as needed.      HYDROmorphone (DILAUDID) 2 MG tablet Take 1.5 tablets (3 mg total) by mouth every six (6) hours for 14 days. 84 tablet 0    HYDROmorphone (DILAUDID) 2 MG tablet Take 1.5 tablets (3 mg total) by mouth every three (3) hours as needed for moderate pain or severe pain. 120 tablet 0    hydrOXYzine (ATARAX) 25 MG tablet Take 1 tablet (25 mg total) by mouth every six (6) hours as needed for itching or anxiety (also insomnia). 20 tablet 0    lidocaine (LIDODERM) 5 % patch Place 1 patch on the skin daily. Apply to affected area for 12 hours only each day (then remove patch)      menthol-zinc oxide (CALMOSEPTINE) 0.44-20.6 % Oint Apply topically four (4) times a day as needed. 113 g 11    mirtazapine (REMERON) 7.5 MG tablet Take 1 tablet (7.5 mg total) by mouth nightly. 90 tablet 3    naloxone (NARCAN) 4 mg nasal spray One spray in either nostril once for known/suspected opioid overdose. May repeat every 2-3 minutes in alternating nostril til EMS arrives 2 each 0    OLANZapine (ZYPREXA) 2.5 MG tablet Take 1 tablet (2.5 mg total) by mouth nightly. 30 tablet 0    ondansetron (ZOFRAN-ODT) 8 MG disintegrating tablet Take 1 Take 2 tablets (400 mg total) by mouth daily. 60 tablet 2    triamcinolone (KENALOG) 0.025 % ointment Apply two times a day as needed to the body for itchy skin or rash. 30 g 0    triamcinolone (KENALOG) 0.1 % cream Apply topically two (2) times a day. 28 g 3    zinc oxide 10 % Crea Apply 1 application. topically daily as needed.      clobetasoL (CLOBEX) 0.05 % shampoo Apply topically two (2) times a day. (Patient not taking: Reported on 01/02/2022) 118 mL 0    COURIERED MED OR SUPPLY [id number], SORAFENIB 200MG  TABLET 1 each 0    HYDROmorphone (DILAUDID) 2 MG tablet Take 1.5 tablets (3 mg total) by mouth every three (3) hours as needed for moderate pain or severe pain. 120 tablet 0    naloxone (NARCAN) 4 mg nasal spray One spray in either nostril once for known/suspected opioid overdose. May repeat every 2-3 minutes in alternating nostril til EMS arrives 2 each 0    ondansetron (ZOFRAN-ODT) 8 MG disintegrating tablet Take 1 tablet (8 mg total) by mouth every twelve (12) hours as needed for nausea.      tacrolimus (PROTOPIC) 0.1 % ointment Apply 1 application. topically Two (2) times a day. Apply two times a day to the face 100 g 0     No current facility-administered medications for this visit.         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  She is a full time Consulting civil engineer.  She lives in an apartment with a nursing friend at school, at home lives with mom  and dad.  Hobbies include music, reading, watching medical dramas.   She is a non-smoker  Alcohol history: none    Family History:  family history includes Alcohol abuse in her paternal mother, paternal aunt, paternal uncle, and sister; Other in her maternal grandmother; Stroke in her maternal grandmother; Thyroid disease in her maternal grandmother.   Detailed thrombosis family history above in HPI.    Review of Systems:  As per HPI, otherwise negative x 12 systems.    Objective :  There were no vitals filed for this visit.    Physical Exam:  GEN: Well-appearing female in no acute distress.  HEENT: Moist mucous membranes, normocephalic.  NEURO: A&Ox3, CNII-XII grossly intact.  PSY: Appropriate affect, reasonable insight and judgment.  Extremities: Bilateral extremities without swelling or edema, equal in size, without erythema.  No appreciable swelling, redness, or tenderness in the RUE.  Bruising on the left forearm at prior IV site.  Skin: Dry, warm, no rashes.  Bandage on right chest at prior CVAD site.    Test Results  Results for orders placed or performed in visit on 12/31/21   Iron Panel   Result Value Ref Range    Iron 42 (L) 50 - 170 ug/dL    TIBC 161 096 - 045 ug/dL    Iron Saturation (%) 15 (L) 20 - 55 %   TSH   Result Value Ref Range    TSH 1.639 0.550 - 4.780 uIU/mL     *Note: Due to a large number of results and/or encounters for the requested time period, some results have not been displayed. A complete set of results can be found in Results Review.       Arvella Merles, PA-C  Physician Assistant  Hematology/Oncology

## 2022-01-01 NOTE — Unmapped (Addendum)
It was a pleasure seeing you at the [facility] today.    Today we refilled your prescription for Oxycodone with 10 mg tablets that you may continue to take every 4 hours as needed. Continue taking Dilaudid at night. You may take Baclofen 5mg  up to 3 times per day for muscle spasms.    We discussed the Celiac block procedure,which we will schedule for you.       Today you received trigger point injections to your right shoulder. If you feel any uncomfortable muscle tightness after the procedure, you may take a 5mg  baclofen tablet.       POST PROCEDURE INSTRUCTIONS   You may apply an ice pack 20-30 minutes at a time to the injection site if you experience soreness.     Keep the injection site clean and dry. You make remove the band-aid one day following the procedure.     You may take a shower but AVOID getting in to baths, pools or whirlpools for 48 HOURS AFTER THE PROCEDURE.       ACTIVITY   Refrain from heavy activity for the next 24 to 48 hours. General walking is okay. You may resume your normal activities the day following the procedure.   You may start or resume your individualized exercise program or physical therapy 48 hours after the procedure.   MEDICATIONS   Please note that it is okay to continue other prescribed medications (blood pressure, insulin, water pill, depression/anxiety pill, etc.) as well as other prescribed pain medications such as Neurontin, Lyrical, Celebrex, Ultram, Vicodin, Norco and acetaminophen (Tylenol).   SIDE EFFECTS   Increase in pain during the first 24 to 48 hours.     You might experience:   1. Mild to moderate swelling at the joint.   2. Possible bruising at the injection site.     WHEN TO CALL THE DOCTOR/NURSE   Severe pain, worse or different that the pain you had before the procedure.     Fever or chills.     Redness, or swelling around the injection site.     Call the Pain Management  Procedural nurses [phone],  during normal business hours (7 am-3 pm). If it is AFTER HOURS or during a weekend or holiday, call the hospital operator and ask for the Anesthesia Pain physician on call at [phone].   FOR EMERGENCIES, CALL 911 OR GO TO THE NEAREST HOSPITAL EMERGENCY DEPARTMENT.   ?   TO SCHEDULE APPOINTMENTS OR FOR QUESTIONS RELATED TO MEDICATIONS   Call the Pain Management Clinic at [phone]

## 2022-01-01 NOTE — Unmapped (Unsigned)
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 - 30 minutes.  Only occur 1-2x/ month.    -MDD ROS: a little bit stressed.    -MANIA: slept only 1 hour for only two weeks while hospitalized.  Felt tired and extremely anxious, couldn't sleep from pain  -PSYCHOSIS: denies lifetime AVH  -SI: has had a few different seasons where I was depressed, but never made plans. No attempts.  Last SI in January 2023  -HI: never  -Guns: no    Objective:     Vitals:   There were no vitals filed for this visit.    Mental Status Exam:  Appearance:    Appears stated age, neatly dressed   Motor:   No abnormal movements   Speech/Language:    Normal rate, volume, tone, fluency   Mood:   Okay   Affect:    Mildly anxious   Thought process:   Logical, linear, clear, coherent, goal directed   Thought content:     Denies SI, HI, self harm, delusions, obsessions, paranoid ideation, or ideas of reference   Perceptual disturbances:     Denies auditory and visual hallucinations, behavior not concerning for response to internal stimuli     Orientation:   Oriented to person, place, time, and general circumstances   Attention:   Able to fully attend without fluctuations in consciousness   Concentration:   Able to fully concentrate and attend   Memory:   Immediate, short-term, long-term, and recall grossly intact    Fund of knowledge:    Consistent with level of education and development   Insight:     Intact vitals filed for this visit.    Mental Status Exam:  Appearance:    Appears stated age, neatly dressed   Motor:   No abnormal movements   Speech/Language:    Normal rate, volume, tone, fluency   Mood:   Okay   Affect:    Mildly anxious   Thought process:   Logical, linear, clear, coherent, goal directed   Thought content:     Denies SI, HI, self harm, delusions, obsessions, paranoid ideation, or ideas of reference   Perceptual disturbances:     Denies auditory and visual hallucinations, behavior not concerning for response to internal stimuli     Orientation:  Oriented to person, place, time, and general circumstances   Attention:   Able to fully attend without fluctuations in consciousness   Concentration:   Able to fully concentrate and attend   Memory:   Immediate, short-term, long-term, and recall grossly intact    Fund of knowledge:    Consistent with level of education and development   Insight:     Intact   Judgment:    Intact   Impulse Control:   Intact     PE:   The patient sits comfortably in view of the camera.  The patient's breathing is observed to be comfortable and normal.  The patient is in no acute distress.  All extremity movements appear intact.  There are no focal neurological deficits observed.     Test Results:  Data Review:  recent labs reviewed   Component      Latest Ref Rng 05/22/2020 11/20/2020 02/13/2021 04/29/2021   TSH      0.550 - 4.780 uIU/mL 0.771  1.103  0.525 (L)  0.492 (L)       Component      Latest Ref Rng 10/05/2018 05/22/2020 02/13/2021 04/29/2021   Free T4      0.89 - 1.76 ng/dL 0.98  1.19  1.47  8.29      Component      Latest Ref Rng 05/13/2021 06/21/2021 08/26/2021 10/02/2021   HGB      11.3 - 14.9 g/dL 56.2 (E) 13.0 (H)  86.5  14.2       Imaging: None    [name], MD  01/01/2022

## 2022-01-01 NOTE — Unmapped (Signed)
Medication:OXYCODONE IR 5 MG   ZOX:WRUE 1-2 Q 4 HRS PRN FOR PAIN   Quantity on RX: #100  Filled on:11/22/2021  Pill count today: #1

## 2022-01-01 NOTE — Unmapped (Signed)
Called patient to offer appt per Dr. Kateri Mc for Friday 10.27.23.@ 3pm had to LVM.Marland Kitchen MJ

## 2022-01-02 ENCOUNTER — Telehealth
Admit: 2022-01-02 | Discharge: 2022-01-03 | Payer: PRIVATE HEALTH INSURANCE | Attending: Student in an Organized Health Care Education/Training Program | Primary: Student in an Organized Health Care Education/Training Program

## 2022-01-02 ENCOUNTER — Ambulatory Visit: Admit: 2022-01-02 | Discharge: 2022-01-03 | Payer: PRIVATE HEALTH INSURANCE

## 2022-01-02 DIAGNOSIS — D500 Iron deficiency anemia secondary to blood loss (chronic): Principal | ICD-10-CM

## 2022-01-02 DIAGNOSIS — F321 Major depressive disorder, single episode, moderate: Principal | ICD-10-CM

## 2022-01-02 DIAGNOSIS — I82A11 Acute embolism and thrombosis of right axillary vein: Principal | ICD-10-CM

## 2022-01-02 DIAGNOSIS — F411 Generalized anxiety disorder: Principal | ICD-10-CM

## 2022-01-02 DIAGNOSIS — Z79899 Other long term (current) drug therapy: Secondary | ICD-10-CM | POA: Diagnosis not present

## 2022-01-02 DIAGNOSIS — Z7901 Long term (current) use of anticoagulants: Secondary | ICD-10-CM | POA: Diagnosis not present

## 2022-01-02 DIAGNOSIS — Z885 Allergy status to narcotic agent status: Secondary | ICD-10-CM | POA: Diagnosis not present

## 2022-01-02 DIAGNOSIS — Z881 Allergy status to other antibiotic agents status: Secondary | ICD-10-CM | POA: Diagnosis not present

## 2022-01-02 DIAGNOSIS — N939 Abnormal uterine and vaginal bleeding, unspecified: Secondary | ICD-10-CM | POA: Diagnosis not present

## 2022-01-02 DIAGNOSIS — Z86718 Personal history of other venous thrombosis and embolism: Secondary | ICD-10-CM | POA: Diagnosis not present

## 2022-01-02 DIAGNOSIS — K219 Gastro-esophageal reflux disease without esophagitis: Secondary | ICD-10-CM | POA: Diagnosis not present

## 2022-01-02 DIAGNOSIS — Z792 Long term (current) use of antibiotics: Secondary | ICD-10-CM | POA: Diagnosis not present

## 2022-01-02 DIAGNOSIS — Z9049 Acquired absence of other specified parts of digestive tract: Secondary | ICD-10-CM | POA: Diagnosis not present

## 2022-01-02 DIAGNOSIS — Z888 Allergy status to other drugs, medicaments and biological substances status: Secondary | ICD-10-CM | POA: Diagnosis not present

## 2022-01-02 DIAGNOSIS — R55 Syncope and collapse: Secondary | ICD-10-CM | POA: Diagnosis not present

## 2022-01-02 DIAGNOSIS — E43 Unspecified severe protein-calorie malnutrition: Secondary | ICD-10-CM | POA: Diagnosis not present

## 2022-01-02 MED ORDER — OLANZAPINE 5 MG DISINTEGRATING TABLET
ORAL_TABLET | Freq: Every evening | ORAL | 2 refills | 30 days | Status: CP
Start: 2022-01-02 — End: 2022-04-02

## 2022-01-02 NOTE — Unmapped (Signed)
Patient and her mom called in stated that she can make appt appt that Dr. Kateri Mc offer for 10.27.23.@ 3pm - Patient needs to sign Limited Release of information form.Marland Kitchen MJ
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
## 2022-01-02 NOTE — Unmapped (Signed)
We will not plan for IV iron at this time given the risk.  However, if you are hospitalized again, it would be reasonable to retry Ferrlecit in the inpatient setting with premedications - steroids, tylenol, benadryl, and pepcid.    If you have a central line placed for TPN, it would also be reasonable to restart lovenox prophylaxis.

## 2022-01-02 NOTE — Unmapped (Signed)
Comprehensive Cancer Support Program (CCSP) - Psychiatry Outpatient Clinic     After Visit Summary    It was a pleasure to see you today in the [facility]s Comprehensive Cancer Support Program (CCSP). The CCSP is a multidisciplinary program dedicated to helping patients, caregivers, and families with cancer treatment, recovery and survivorship.      To learn more about the CCSP  CCSP Website:  [email or web address]    Call or stop by the Patient [facility] Resource Center  Location: Ground floor, [facility]  Phone: [phone]    To schedule, cancel, or change your appointment  Please message your provider via MyChart. You can also call the CCSP coordinator, [employer], at [phone].     For after hours urgent issues, you may call [phone] or call the I need to talk line at [phone] anytime 24/7.    For prescription refills  Please allow at least 24 hours (during business hours, M-F). We are generally unable to accommodate same-day requests for refills.     If you are taking any controlled substances (such as anxiety or sleep medications), you must use them as the directions say to use them. We generally do not provide early refills over the phone without clear reason, and it would be inappropriate to obtain the medications from other doctors. We routinely use the [place] controlled substance database to monitor prescription drug use.

## 2022-01-03 ENCOUNTER — Telehealth: Payer: Self-pay | Admitting: Physician Assistant

## 2022-01-03 ENCOUNTER — Ambulatory Visit: Admit: 2022-01-03 | Discharge: 2022-01-04 | Payer: PRIVATE HEALTH INSURANCE

## 2022-01-03 DIAGNOSIS — T300 Burn of unspecified body region, unspecified degree: Principal | ICD-10-CM

## 2022-01-03 DIAGNOSIS — L309 Dermatitis, unspecified: Principal | ICD-10-CM

## 2022-01-03 DIAGNOSIS — D1391 Familial adenomatous polyposis: Secondary | ICD-10-CM | POA: Diagnosis not present

## 2022-01-03 DIAGNOSIS — M728 Other fibroblastic disorders: Secondary | ICD-10-CM | POA: Diagnosis not present

## 2022-01-03 DIAGNOSIS — K5900 Constipation, unspecified: Secondary | ICD-10-CM | POA: Diagnosis not present

## 2022-01-03 DIAGNOSIS — Z8616 Personal history of COVID-19: Secondary | ICD-10-CM | POA: Diagnosis not present

## 2022-01-03 DIAGNOSIS — D48118 Desmoid tumor of other site: Secondary | ICD-10-CM | POA: Diagnosis not present

## 2022-01-03 DIAGNOSIS — F411 Generalized anxiety disorder: Secondary | ICD-10-CM | POA: Diagnosis not present

## 2022-01-03 DIAGNOSIS — D692 Other nonthrombocytopenic purpura: Secondary | ICD-10-CM | POA: Diagnosis not present

## 2022-01-03 DIAGNOSIS — E43 Unspecified severe protein-calorie malnutrition: Secondary | ICD-10-CM | POA: Diagnosis not present

## 2022-01-03 DIAGNOSIS — F410 Panic disorder [episodic paroxysmal anxiety] without agoraphobia: Secondary | ICD-10-CM | POA: Diagnosis not present

## 2022-01-03 DIAGNOSIS — Z79891 Long term (current) use of opiate analgesic: Secondary | ICD-10-CM | POA: Diagnosis not present

## 2022-01-03 DIAGNOSIS — M2180 Other specified acquired deformities of unspecified limb: Secondary | ICD-10-CM | POA: Diagnosis not present

## 2022-01-03 DIAGNOSIS — K219 Gastro-esophageal reflux disease without esophagitis: Secondary | ICD-10-CM | POA: Diagnosis not present

## 2022-01-03 DIAGNOSIS — D500 Iron deficiency anemia secondary to blood loss (chronic): Secondary | ICD-10-CM | POA: Diagnosis not present

## 2022-01-03 MED ORDER — PIMECROLIMUS 1 % TOPICAL CREAM
Freq: Two times a day (BID) | TOPICAL | 11 refills | 0.00000 days | Status: CP
Start: 2022-01-03 — End: 2023-01-03

## 2022-01-03 MED ORDER — SILVER SULFADIAZINE 1 % TOPICAL CREAM
Freq: Every day | TOPICAL | 11 refills | 0.00000 days | Status: CP
Start: 2022-01-03 — End: 2023-01-03

## 2022-01-03 MED ORDER — CLOBETASOL 0.05 % SCALP SOLUTION
Freq: Two times a day (BID) | TOPICAL | 6 refills | 0.00000 days | Status: CP
Start: 2022-01-03 — End: 2023-01-03
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
## 2022-01-03 NOTE — Unmapped (Signed)
Addended by: Jilda Roche A on: 01/03/2022 07:56 AM     Modules accepted: Orders

## 2022-01-03 NOTE — Unmapped (Signed)
PLAN:  - Apply medicated ointment/cream to active areas of rash 2x/[name] until CLEAR (no longer rough, red or itchy), then stop. When the rash comes back, restart the medication until clear.   - To face: elidel cream    - To scalp: clobetasol solution and clobetsol shampoo   - To ears and body: triamcinolone ointment      Additional recommendations:  - Instead of duoderm to use for NG tube, we recommend IV block 3000 tape for possible decreased irritation   - Use over-the-counter Sarna lotion (helps with itch, pain, and discomfort) to the body. You can use this up to once every hour because it is nonsteroidal     For the burn, we prescribed Silvadene cream. Apply daily to burn.     The right strength medication should clear an area of eczema within 5-7 days and that area should stay clear for about a week before needing medication again.  If the area does not improve within a week, step up to a stronger steroid (stubborn area medication)  Do not use the medication to normal skin    - Moisturize several times a [name] with an ointment (plain petroleum jelly/Vaseline, Aquaphor, coconut oil) or heavy cream (Vanicream, CereVe, Cetaphil, Eucerin)  - Keep baths short (5-15min), limit soap as much as possible (Cetaphil gentle cleanser, vanicream bar, Dove Tip to Toe unscented, Trade Joe oatmeal and honey bar soap), apply moisturizers and medication immediately after bath/shower    Atopic dermatitis (eczema)    Atopic dermatitis, also called eczema, is a common and chronic skin condition in which the skin appears inflamed, red, itchy and dry. It most commonly affects children.    Atopic dermatitis is most likely caused by a combination of genetic and environmental factors. Genetic causes include differences in the proteins that form the skin barrier. When this barrier is broken down, the skin loses moisture more easily, becoming more dry, easily irritated, and hypersensitive. The skin is also more prone to infection (with bacteria, viruses, or fungi). The immune system in the skin may be different and overreact to environmental triggers such as pet dander and dust mites.    Allergies and asthma may be present more frequently in individuals with atopic  dermatitis, but they are not the cause of eczema. Infrequently, when a specific food  allergy is identified, eating that food may make atopic dermatitis worse, but it usually is not the cause of the eczema.    In infants, atopic dermatitis often starts as a dry red rash on the cheeks and around  the mouth, often made worse by drooling. As children grow older, the rash may be on the arms, legs, or in other areas where they are able to scratch. In teenagers, eczema is often on the inside of the elbows and knees, on the hands and feet, and around the eyes.    There is no cure, but there are recommendations to help manage this skin problem.    TREATMENT    Treatments are aimed at preventing dry skin, treating the rash, improving the itch, and minimizing exposure to triggers.    1. GENTLE SKIN CARE TO PREVENT DRYNESS  Bathe daily or every-other-[name] [name] in order to wash off dirt and other potential irritants (the optimal frequency of bathing is not yet clear).  Water should be warm (not hot), and bath time should be limited to 5-10 minutes.  Pat-dry the skin and immediately apply moisturizer while the skin is still slightly damp. The moisturizer  provides a seal to hold the water in the skin.  Finding a cream or ointment that the child likes or can tolerate is important, as resistance from the child may make the daily regimen difficult to keep up.  The thicker the moisturizer, the better the barrier it generally provides.  Ointments are more effective than creams, and creams more so than lotions. Creams are a reasonable option during the summer when thick greasy ointments are uncomfortable.      2. TREATING THE RASH  The most commonly used medications are topical corticosteroids (???steroids???). There  are many different types of topical corticosteroids that come in different strengths and formulations (for example, ointments, creams, lotions, solutions, gels, oils). Therefore, finding the right combination for the individual is important to treat and to minimize the risk of unwanted side effects from the corticosteroid, such as skin thinning. In general, these topical corticosteroids should be applied as a thin layer and no more than twice daily. It is very unusual to see any side effects when a topical corticosteroid is used as prescribed by your doctor. A relatively newer form of topical medication - in tacrolimus ointment and pimecrolimus cream - is also helpful, particularly in thin-skinned areas such as the eyelids, armpits, and groin.* For severe and treatment-resistant cases of atopic dermatitis, systemic medications may be necessary. They may be associated with serious side effects and therefore require closer monitoring.    *The FDA placed a black-box warning on both tacrolimus ointment and pimecrolimus cream in 2006 based on animal studies using the medications. Some animals developed skin cancer and lymphoma. Subsequently, the FDA released a statement that there is no causal relationship between the two medications and cancer. Because of this concern, there are ongoing studies to evaluate this relationship in humans. So far, studies support the safety of these medications. One showed that the rates of cancer in patients using these medications topically were less than the rates of the general population; several studies have shown that the medicines are undetectable in the blood, even in children using the medication over a large area of the body.    3. TREATING THE [facility]  Tell your physician if your child is very itchy or if the itch is affecting the ability to sleep. Oral anti-itch medicines (antihistamines) can be helpful for inducing sleep, but usually do not reduce the itch and scratching.    4. AVOIDING TRIGGERS  Some children have specific things that trigger episodes of itchiness and rashes, while others may have none that can be identified. Triggers may even change over time. Common triggers include: excessive bathing without moisturization, low humidity, cigarette or wood smoke exposure, emotional stress, sweat, friction and overheating of skin, and exposure to certain products such as wool, harsh soaps, fragrance, bubble baths, and laundry detergents. Many parents and physicians consider allergy testing to identify possible triggers that could be avoided. There is limited utility for specific Immunoglobulin E (IgE) levels; if food allergy is being considered as a trigger for the dermatitis (which is unusual), specific IgE levels are, at best, a guideline of potential allergic triggers and require food challenge testing to further consider the possibility.      5. RECOGNIZING INFECTIONS AS A TRIGGER  Because the skin barrier is compromised, individuals with atopic dermatitis can also  develop infections on the skin from bacteria, viruses, or fungi. The most common infection is from Staphylococcus aureus bacteria, which should be suspected when the skin develops honey-colored crusts, or appears raw and  weepy. Infected skin may result in a worsening of the atopic dermatitis and may not respond to standard therapy. Diluted bleach baths can be helpful to reduce infection by S. aureus and thereby help better control atopic dermatitis. Some patients require oral and/or topical antibiotics or antiviral medications for these types of flares. Patients with atopic dermatitis may also be at risk for the spread on the skin of herpes virus; therefore, family and friends with a known or suspected history of herpes virus (cold sores, fever blisters, etc.) should avoid contacting patients with atopic dermatitis when they are having an active outbreak.      Contributing SPD Members:  Alyson Ingles, MD, Marlou Porch, MD, Sandi Raveling, MD, Corine Shelter, MD, Angie Fava, MD, Vickki Muff, MD    Committee Reviewers:  Luretha Murphy, MD, Ferne Reus, MD    Expert Reviewer:  Rosalie Gums, MD    The Society for Pediatric Dermatology and [employer] cannot be held responsible for any errors or for any consequences arising from the use of the information contained in this handout. Handout originally published in Pediatric Dermatology: Vol. 33, No. 1 (2016).    ?? 2016 The Society for Pediatric Dermatology

## 2022-01-03 NOTE — Telephone Encounter (Signed)
..  Type of form received: POC   Additional comments:   Received by: Fax/ [facility]   Form should be Faxed to: [phone]   Form should be mailed to:   na   Is patient requesting call for pickup: na    Form placed:   samanthas folder   Attach charge sheet.   Yes  Individual made aware of 3-5 business day turn around (Y/N)?  No

## 2022-01-04 NOTE — Unmapped (Signed)
Addended by: Jolyn Lent. on: 01/04/2022 01:50 PM     Modules accepted: Orders

## 2022-01-06 ENCOUNTER — Telehealth: Payer: Self-pay | Admitting: Physician Assistant

## 2022-01-06 ENCOUNTER — Institutional Professional Consult (permissible substitution)
Admit: 2022-01-06 | Discharge: 2022-01-07 | Payer: PRIVATE HEALTH INSURANCE | Attending: Registered" | Primary: Registered"

## 2022-01-06 DIAGNOSIS — D48119 Desmoid tumor: Principal | ICD-10-CM

## 2022-01-06 DIAGNOSIS — D500 Iron deficiency anemia secondary to blood loss (chronic): Secondary | ICD-10-CM | POA: Diagnosis not present

## 2022-01-06 DIAGNOSIS — Z8616 Personal history of COVID-19: Secondary | ICD-10-CM | POA: Diagnosis not present

## 2022-01-06 DIAGNOSIS — D48118 Desmoid tumor of other site: Secondary | ICD-10-CM | POA: Diagnosis not present

## 2022-01-06 DIAGNOSIS — F411 Generalized anxiety disorder: Secondary | ICD-10-CM | POA: Diagnosis not present

## 2022-01-06 DIAGNOSIS — K219 Gastro-esophageal reflux disease without esophagitis: Secondary | ICD-10-CM | POA: Diagnosis not present

## 2022-01-06 DIAGNOSIS — K5900 Constipation, unspecified: Secondary | ICD-10-CM | POA: Diagnosis not present

## 2022-01-06 DIAGNOSIS — M2180 Other specified acquired deformities of unspecified limb: Secondary | ICD-10-CM | POA: Diagnosis not present

## 2022-01-06 DIAGNOSIS — D692 Other nonthrombocytopenic purpura: Secondary | ICD-10-CM | POA: Diagnosis not present

## 2022-01-06 DIAGNOSIS — D1391 Familial adenomatous polyposis: Secondary | ICD-10-CM | POA: Diagnosis not present

## 2022-01-06 DIAGNOSIS — M728 Other fibroblastic disorders: Secondary | ICD-10-CM | POA: Diagnosis not present

## 2022-01-06 DIAGNOSIS — Z79891 Long term (current) use of opiate analgesic: Secondary | ICD-10-CM | POA: Diagnosis not present

## 2022-01-06 DIAGNOSIS — F410 Panic disorder [episodic paroxysmal anxiety] without agoraphobia: Secondary | ICD-10-CM | POA: Diagnosis not present

## 2022-01-06 DIAGNOSIS — E43 Unspecified severe protein-calorie malnutrition: Secondary | ICD-10-CM | POA: Diagnosis not present

## 2022-01-06 MED ORDER — SORAFENIB 200 MG TABLET
ORAL_TABLET | Freq: Every day | ORAL | 2 refills | 60 days | Status: CP
Start: 2022-01-06 — End: 2023-01-06
  Filled 2022-01-08: qty 15, 15d supply, fill #0

## 2022-01-06 MED ADMIN — bupivacaine HCl (MARCAINE) 0.25 % (2.5 mg/mL) injection 20 mg: 8 mL | @ 19:00:00 | Stop: 2022-01-06

## 2022-01-06 NOTE — Unmapped (Signed)
Called  and left voicemail: Left voicemail  Appt date: 01/08/22  Appt time: 10:00 AM advised to arrive 30 mins prior to appt.  Physician: [name]  Location: [phone] [facility]al[facility] Rouse [place]]  Clinic phone number: [phone]  Driver? Yes  Advised to call clinic if experiencing COVID symptoms, signs of infection or recent use of antibiotics or steroids.

## 2022-01-06 NOTE — Unmapped (Signed)
[facility] Shared [facility] Clinical Assessment & Refill Coordination Note    [name] [name], DOB: 01-Apr-2000  Phone: [phone] (home) [phone] (work)    All above HIPAA information was verified with patient's family member, mom.     Was a Nurse, learning disability used for this call? No    Specialty Medication(s):   Hematology/Oncology: Nexavar     Current Outpatient Medications   Medication Sig Dispense Refill    acetaminophen (TYLENOL) 500 MG tablet Take 2 tablets (1,000 mg total) by mouth two (2) times a day.      baclofen (LIORESAL) 5 mg Tab tablet Take 1-2 tablets (5-10 mg total) by mouth two (2) times a day as needed for muscle spasms. 40 tablet 0    cefdinir (OMNICEF) 300 MG capsule Take 1 capsule (300 mg total) by mouth Two (2) times a day. 180 capsule 1    clobetasoL (CLOBEX) 0.05 % shampoo Apply topically two (2) times a day. (Patient not taking: Reported on 01/02/2022) 118 mL 0    clobetasoL (TEMOVATE) 0.05 % external solution Apply topically two (2) times a day. To affected areas on scalp until clear/not itchy. Restart as needed. Not for normal skin. 50 mL 6    COURIERED MED OR SUPPLY [id number], SORAFENIB 200MG  TABLET 1 each 0    diclofenac sodium (VOLTAREN) 1 % gel Apply 2 g topically four (4) times a day. 100 g 0    famotidine (PEPCID) 20 MG tablet Take 1 tablet (20 mg total) by mouth nightly. 90 tablet 3    gabapentin (NEURONTIN) 100 MG capsule Take 100mg  in the morning and afternoon. Take 300 mg at night. 120 capsule 3    hydrocortisone 2.5 % cream Apply 1 application. topically daily as needed.      HYDROmorphone (DILAUDID) 2 MG tablet Take 1.5 tablets (3 mg total) by mouth every three (3) hours as needed for moderate pain or severe pain. 120 tablet 0    hydrOXYzine (ATARAX) 25 MG tablet Take 1 tablet (25 mg total) by mouth every six (6) hours as needed for itching or anxiety (also insomnia). 20 tablet 0    hyoscyamine (LEVSIN) 0.125 mg tablet Take 1 tablet (0.125 mg total) by mouth every four (4) hours as needed for cramping.      lidocaine (LIDODERM) 5 % patch Place 1 patch on the skin daily. Apply to affected area for 12 hours only each day (then remove patch)      menthol-zinc oxide (CALMOSEPTINE) 0.44-20.6 % Oint Apply topically four (4) times a day as needed. 113 g 11    mirtazapine (REMERON) 7.5 MG tablet Take 1 tablet (7.5 mg total) by mouth nightly. 90 tablet 3    naloxone (NARCAN) 4 mg nasal spray One spray in either nostril once for known/suspected opioid overdose. May repeat every 2-3 minutes in alternating nostril til EMS arrives 2 each 0    OLANZapine zydis (ZYPREXA) 5 MG disintegrating tablet Take 1 tablet (5 mg total) by mouth nightly. 30 tablet 2    ondansetron (ZOFRAN-ODT) 8 MG disintegrating tablet Take 1 tablet (8 mg total) by mouth every twelve (12) hours as needed for nausea.      oxyCODONE (ROXICODONE) 10 mg immediate release tablet Take 1 tablet (10 mg total) by mouth every four (4) hours as needed for pain. Fill on or after: 01/01/22 180 tablet 0    pimecrolimus (ELIDEL) 1 % cream Apply topically two (2) times a day. To face as needed for rash 30  g 11    silver sulfaDIAZINE (SILVADENE) 1 % cream Apply topically daily. To burn on leg 50 g 11    SORAfenib (NEXAVAR) 200 mg tablet Take 2 tablets (400 mg total) by mouth daily. 60 tablet 2    triamcinolone (KENALOG) 0.025 % ointment Apply two times a day as needed to the body for itchy skin or rash. (Patient not taking: Reported on 01/03/2022) 30 g 0    triamcinolone (KENALOG) 0.1 % cream Apply topically two (2) times a day. (Patient not taking: Reported on 01/03/2022) 28 g 3    zinc oxide 10 % Crea Apply 1 application. topically daily as needed.       Current Facility-Administered Medications   Medication Dose Route Frequency Provider Last Rate Last Admin    bupivacaine HCl (MARCAINE) 0.25 % (2.5 mg/mL) injection 20 mg  8 mL Infiltration Once Nat Christen., MD        ketorolac (TORADOL) injection 15 mg 15 mg Intravenous Once Nat Christen., MD            Changes to medications: [name] [name] reports no changes at this time.    Allergies   Allergen Reactions    Adhesive Tape-Silicones Hives and Rash     Paper tape  And tegederm ok    Ferrlecit [Sodium Ferric Gluconat-Sucrose] Swelling and Rash    Methylnaltrexone      Per Patient: I lost bowel control, severe abdominal cramping, and elevated BP    Neomycin Swelling     Rxn after ear drops; ear swelling    Papaya Hives    Morphine Nausea And Vomiting    Zosyn [Piperacillin-Tazobactam] Itching and Rash     Red and itchy    Compazine [Prochlorperazine] Other (See Comments)     Extreme agitation    Latex, Natural Rubber Rash       Changes to allergies: No    SPECIALTY MEDICATION ADHERENCE     Nexavar 200 mg: 5 days of medicine on hand     Medication Adherence    Patient reported X missed doses in the last month: 0  Specialty Medication: sorafenib 200 mg - 2 tabs (400 mg) once daily.  Mom reports she is getting 1 tab (200 mg) inpatient  Patient is on additional specialty medications: No  Informant: mother                  Confirmed plan for next specialty medication refill: delivery by pharmacy  Refills needed for supportive medications: not needed          Specialty medication(s) dose(s) confirmed:  mom reports [name] [name] is taking 1 tablet (200 mg) once daily per provider recommendation      Are there any concerns with adherence? No    Adherence counseling provided? Not needed    CLINICAL MANAGEMENT AND INTERVENTION      Clinical Benefit Assessment:    Do you feel the medicine is effective or helping your condition? Yes    Clinical Benefit counseling provided? Not needed    Adverse Effects Assessment:    Are you experiencing any side effects?  No new or worsening side effects reported    Are you experiencing difficulty administering your medicine? No    Quality of Life Assessment:    Quality of Life    Rheumatology  Oncology  1. What impact has your specialty medication had on the reduction of your daily pain or discomfort level?: None  2. On a scale  of 1-10, how would you rate your ability to manage side effects associated with your specialty medication? 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 For example, brushing your teeth or getting up in the morning. Did not discuss with mom    Have you discussed this with your provider? Yes    Acute Infection Status:    Acute infections noted within Epic:  No active infections  Patient reported infection: None    Therapy Appropriateness:    Is therapy appropriate and patient progressing towards therapeutic goals? Yes, therapy is appropriate and should be continued    DISEASE/MEDICATION-SPECIFIC INFORMATION      N/A    Oncology: Is the patient receiving adequate infection prevention treatment? Not applicable  Does the patient have adequate nutritional support? Yes, Ethlyn is on tube feeding    PATIENT SPECIFIC NEEDS     Does the patient have any physical, cognitive, or cultural barriers? No    Is the patient high risk? No    Did the patient require a clinical intervention? No    Does the patient require physician intervention or other additional services (i.e., nutrition, smoking cessation, social work)? No    SOCIAL DETERMINANTS OF HEALTH     At the [facility], we have learned that life circumstances - like trouble affording food, housing, utilities, or transportation can affect the health of many of our patients.   That is why we wanted to ask: are you currently experiencing any life circumstances that are negatively impacting your health and/or quality of life? Patient declined to answer    Social Determinants of Health     Financial Resource Strain: Low Risk  (08/30/2021)    Overall Financial Resource Strain (CARDIA)     Difficulty of Paying Living Expenses: Not very hard   Internet Connectivity: Not on file   Food Insecurity: No Food Insecurity (08/30/2021)    Hunger Vital Sign     Worried About Running Out of Food in the Last Year: Never true     Ran Out of Food in the Last Year: Never true   Tobacco Use: Low Risk  (01/03/2022)    Patient History     Smoking Tobacco Use: Never     Smokeless Tobacco Use: Never     Passive Exposure: Past   Housing/Utilities: Low Risk  (08/30/2021)    Housing/Utilities     Within the past 12 months, have you ever stayed: outside, in a car, in a tent, in an overnight shelter, or temporarily in someone else's home (i.e. couch-surfing)?: No     Are you worried about losing your housing?: No     Within the past 12 months, have you been unable to get utilities (heat, electricity) when it was really needed?: No   Alcohol Use: Not on file   Transportation Needs: No Transportation Needs (08/30/2021)    PRAPARE - Therapist, art (Medical): No     Lack of Transportation (Non-Medical): No   Substance Use: Not on file   Health Literacy: Not on file   Physical Activity: Not on file   Interpersonal Safety: Not on file   Stress: Not on file   Intimate Partner Violence: Not on file   Depression: Not on file  Social Connections: Not on file     Would you be willing to receive help with any of the needs that you have identified today? Not applicable     SHIPPING     Specialty Medication(s) to be Shipped:   Hematology/Oncology: Nexavar    Other medication(s) to be shipped: No additional medications requested for fill at this time     Changes to insurance: No    Delivery Scheduled: Yes, Expected medication delivery date: 01/09/22.     Medication will be delivered via UPS to the confirmed prescription address in [facility].    The patient will receive a drug information handout for each medication shipped and additional FDA Medication Guides as required.  Verified that patient has previously received a Conservation officer, historic buildings and a Surveyor, mining.    The patient or caregiver noted above participated in the development of this care plan and knows that they can request review of or adjustments to the care plan at any time.      All of the patient's questions and concerns have been addressed.    Kermit Balo, [facility]   [facility] Shared [facility] Specialty Pharmacist

## 2022-01-06 NOTE — Telephone Encounter (Signed)
..  Home Health Verbal Orders  Agency:  [place]: Lelon Huh and title   [phone]  -OT  Requesting OT/Skilled nursing/ Social Work/ Speech:    Reason for Request:  Needs help with hygiene, dressing, transfers  Frequency:  1 x per week for 6 weeks  HH needs F2F w/in last 30 days    Please leave detailed message on secure voice mail

## 2022-01-06 NOTE — Telephone Encounter (Signed)
Leda Quail, here is the message you sent me. I have not seen any forms.

## 2022-01-06 NOTE — Telephone Encounter (Signed)
Please see message. Okay for orders?

## 2022-01-06 NOTE — Telephone Encounter (Signed)
Spoke to pt Stephanie with [facility] verbal orders given for OT once a week x 6 weeks for patient, okay per [place]. Colletta [place] verbalized understanding.

## 2022-01-07 NOTE — Unmapped (Signed)
PROCEDURE; bilateral Celiac Plexus BLOCK     PRE-PROCEDURE DIAGNOSIS: chronic abdominal pain  POST-PROCEDURE DIAGNOSIS: Same    PERFORMED BY: [name]  ASSISTANT: Dr. Jerrell Mylar  Anesthesia: Local and IV sedation - fentanyl 100 mcg, 4 mg zofran    INTERIM HISTORY:     21 y.o. 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 [facility] pain related to abdominal and right shoulder pain in the setting of her desmoid tumors. She is active in PT and on a multimodal pain regimen that includes PRN oxycodone, which she is trying to wean off of. Patient has been receiving TPI injections to  improve her pain control and help wean oxycodone. September CT showed masses in LLQ, LUQ and periumbilical regions. The RLQ pain and the masses in the periumbilical and LUQ regions would be covered by a celiac plexus block. Patient presents today for a bilateral celiac plexus block.      PROCEDURE PLANNED: bilateral Fluoroscopically guided Celiac Plexus Block at L1    Informed consent was obtained and potential risks discussed including, but not limited to: bleeding, bruising, severe allergic reaction to components of the injection materials, infection (superficial, deep, abscess), nerve or spinal cord damage, paralysis, inability to place the needle properly, the possibility of no benefit (pain relief) derived from the injection, or in rare occasions worsening of pain, vascular uptake leading to local anesthetic toxicity with resultant dizziness, seizures, or cardiac arrest, puncture of an arterial or venous vessel, swelling of the legs, retroperitoneal hematoma, blood in the urine, perforation of an intestinal viscus or kidney structure, and lumbar discitis. Questions were answered to the patient's satisfaction and the patient wishes to proceed.  Alternative options for treatment have previously been discussed and explored with the patient. The patient is not taking antiplatelet or anticoagulation medications and does  have a driver today.    PROCEDURE DESCRIPTION:    After informed consent was obtained and an IV started in the patients arm, the patient was placed prone on the fluoroscopy table and all pressure points padded. Standard ASA monitors were applied. A timeout protocol was performed. Hand washing with antibacterial soap and water and/or the use of alcohol based cleanser was performed. Proper protective gear was worn by the physician including a mask, scrub cap, and sterile gloves.     The patient's back was prepped with chloroprep x2 and draped in a sterile fashion. Sedation and IV fluids were administered as above. Utilizing fluoroscopic guidance the vertebral body of L1 was visualized and the endplates squared off with appropriate caudad or cranial angluation. A right sided oblique view to approximately 35 degrees was used to visualize the L1 vertebral body. The skin and subcutaneous tissues were anesthetized with a small amount of 0.5% lidocaine.  A 22-gauge 5 inch spinal needle was advanced intermittently under fluoroscopic guidance. Needle placement at the inferolateral aspect of the L1 vertebral body was confirmed with lateral and AP views and the needle was advanced to the anterior border of the L1 vertebral body. Placement was also verified with negative aspiration of blood/CSF, and confirmed with injection of Omnipaque-180 contrast. Appropriate spread of contrast was noted along the right sympathetic chain. A test dose of 3 mL of 1%  lidocaine with 1:200,000 epi was injected slowly under direct visualization with fluoroscopy. After negative intravascular response, an additional solution containing 10 mg of 10 mg/ml dexamethasone with 4 ml of lidocaine with 1:200,000 epinephrine was administered in 1mL increments after negative aspiration each time for a total volume of 2.5cc. The needle was removed, the patient cleaned off, and a bandage applied.    The procedure was repeated in an identical fashion on the contralateral side.    The patient did tolerate the procedure well and there were no apparent complications. All injection sites were sterilely dressed. The patient was discharged after an appropriate period of observation and post procedural education was given.    Total Fluoroscopic time 1 and 11  seconds.  Pre-procedure pain score was 10/10  Post-procedure pain score is 6/10    Temperature lower extremity pre-procedure:  - Left: 27.8  - Right: 26.6    Temperature lower extremity post-procedure:  - Left: 31.1  - Right: 28.3    DISPOSITION:     Patient has a follow up appointment with [name] on 01/29/22    If a celiac plexus block failed to cover the LLQ then an inferior hypogastric plexus block would be the next best step.

## 2022-01-07 NOTE — Telephone Encounter (Signed)
Noted  

## 2022-01-07 NOTE — Telephone Encounter (Signed)
[facility]. Requested forms to be sent to office again. POC requested from 01/03/22.

## 2022-01-08 ENCOUNTER — Ambulatory Visit
Admit: 2022-01-08 | Discharge: 2022-01-09 | Payer: PRIVATE HEALTH INSURANCE | Attending: Student in an Organized Health Care Education/Training Program | Primary: Student in an Organized Health Care Education/Training Program

## 2022-01-08 DIAGNOSIS — R1030 Lower abdominal pain, unspecified: Principal | ICD-10-CM

## 2022-01-08 DIAGNOSIS — R1084 Generalized abdominal pain: Principal | ICD-10-CM

## 2022-01-08 DIAGNOSIS — G894 Chronic pain syndrome: Principal | ICD-10-CM

## 2022-01-08 DIAGNOSIS — D48115 Desmoid tumor of upper extremity and shoulder girdle: Secondary | ICD-10-CM | POA: Diagnosis not present

## 2022-01-08 DIAGNOSIS — E43 Unspecified severe protein-calorie malnutrition: Secondary | ICD-10-CM | POA: Diagnosis not present

## 2022-01-08 DIAGNOSIS — R109 Unspecified abdominal pain: Secondary | ICD-10-CM | POA: Diagnosis not present

## 2022-01-08 DIAGNOSIS — Z681 Body mass index (BMI) 19 or less, adult: Secondary | ICD-10-CM | POA: Diagnosis not present

## 2022-01-08 DIAGNOSIS — D649 Anemia, unspecified: Secondary | ICD-10-CM | POA: Diagnosis not present

## 2022-01-08 DIAGNOSIS — G8929 Other chronic pain: Secondary | ICD-10-CM | POA: Diagnosis not present

## 2022-01-08 DIAGNOSIS — Z9049 Acquired absence of other specified parts of digestive tract: Secondary | ICD-10-CM | POA: Diagnosis not present

## 2022-01-08 DIAGNOSIS — M25511 Pain in right shoulder: Secondary | ICD-10-CM | POA: Diagnosis not present

## 2022-01-08 MED ADMIN — lidocaine (XYLOCAINE) 20 mg/mL (2 %) injection 12 mL: 12 mL | @ 17:00:00 | Stop: 2022-01-08

## 2022-01-08 MED ADMIN — fentaNYL (PF) (SUBLIMAZE) injection 25 mcg: 25 ug | INTRAVENOUS | @ 16:00:00 | Stop: 2022-01-08

## 2022-01-08 MED ADMIN — lidocaine-EPINEPHrine (PF) 1.5 %-1:200,000 injection 9 mL: 9 mL | INTRAMUSCULAR | @ 17:00:00 | Stop: 2022-01-08

## 2022-01-08 MED ADMIN — fentaNYL (PF) (SUBLIMAZE) injection 25 mcg: 25 ug | INTRAVENOUS | @ 15:00:00 | Stop: 2022-01-08

## 2022-01-08 MED ADMIN — dexAMETHasone (DECADRON) injection 10 mg: 10 mg | @ 17:00:00 | Stop: 2022-01-08

## 2022-01-08 MED ADMIN — ondansetron (ZOFRAN) injection 4 mg: 4 mg | INTRAVENOUS | @ 15:00:00 | Stop: 2022-01-08

## 2022-01-08 MED ADMIN — sodium chloride 0.9% (NS) bolus 500 mL: 500 mL | INTRAVENOUS | @ 15:00:00 | Stop: 2022-01-08

## 2022-01-08 MED ADMIN — iohexoL (OMNIPAQUE) 180 mg iodine/mL solution 2 mL: 2 mL | @ 17:00:00 | Stop: 2022-01-08

## 2022-01-08 NOTE — Unmapped (Signed)
AVS reviewed with patient and she verbalized understanding to all.  Driver present.      1223: patient arrived to recovery; very tearful, increase in HR and BP, the pain in my abdomen is worse    1226: HR and BP decreasing; patient appears calmer; no long tearful.

## 2022-01-08 NOTE — Unmapped (Deleted)
Hello,    Please scheduled patient [name] [name] on 01/09/22 for a SCHED ADM THROUGH INF at  {Onc time options:53337}     ?? Reason for Admission: Scheduled chemotherapy   ?? Primary Diagnosis:  Encoun4*** Days   ?? CPT Code:  16109   ?? CPT Code Description: Under Injection and Intravenous Infusion Chemotherapy and Other Highly Complex Drug or Highly Complex Biologic Agent Administration   ?? Treating Attending: ***  ?? Need for PICC placement in Infusion? Yes/No    Infusion Scheduling: Please notify the patient of the appointment date and time once scheduled. Please document this information within this encounter and then route to the navigator prior to closing the encounter.       Scheduled Admissions Assessment        Assessment  Response  Intervention    Type of insurance  {ONC Financial Counselling:53256} {ONC Financial Counselling:53255::Financial counselor referral is not warranted this time.}     Reliable Trasportation  {YES OR NO:23286} {ONC Admit Assessment :53342}   Central Line Access  {YES OR NO:23286} {ONC Admit Assessment :53343}     Other Pertinent Information:  {ONC options :56093}    Provider:  1. Please place order for PICC *** {Yes or No:40173}   Order name: Insert PICC line by Vascular Access (must have the house for  outpatient designation)  2.Please place order for [facility] plan for infusion center     Nurse:   Route this smart phrase to Provider (which signals need to place and sign chemotherapy orders, and to place order for PICC line (if applicable) and line care plan in the infusion center on day of admission)    Thank you,  Ryheem Jay E Baudelia Schroepfer, RN

## 2022-01-08 NOTE — Unmapped (Deleted)
Hello,    Please scheduled patient [name] [name] on *** for a SCHED ADM THROUGH INF at  {Onc time options:53337}     ?? Reason for Admission: Scheduled chemotherapy   ?? 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 Primary Diagnosis ICD-10 Code:  Z51.11 & E43 & D43.119  ?? Expected length of stay: 4-5 Days   ?? CPT Code:  16109   ?? CPT Code Description: Under Injection and Intravenous Infusion Chemotherapy and Other Highly Complex Drug or Highly Complex Biologic Agent Administration   ?? Treating Attending: Donzetta Sprung  ?? Need for PICC placement in Infusion? No    Infusion Scheduling: Please notify the patient of the appointment date and time once scheduled. Please document this information within this encounter and then route to the navigator prior to closing the encounter.       Scheduled Admissions Assessment        Assessment  Response  Intervention    Type of insurance  {ONC Financial Counselling:53256} {ONC Financial Counselling:53255::Financial counselor referral is not warranted this time.}     Reliable Trasportation  {YES OR NO:23286} {ONC Admit Assessment :53342}   Central Line Access  {YES OR NO:23286} {ONC Admit Assessment :53343}     Other Pertinent Information:  {ONC options :56093}    Provider:  1. Please place order for PICC *** {Yes or No:40173}   Order name: Insert PICC line by Vascular Access (must have the house for  outpatient designation)  2.Please place order for [facility] plan for infusion center     Nurse:   Route this smart phrase to Provider (which signals need to place and sign chemotherapy orders, and to place order for PICC line (if applicable) and line care plan in the infusion center on day of admission)    Thank you,  Quay Simkin E Linnaea Ahn, RN

## 2022-01-08 NOTE — Unmapped (Signed)
POST PROCEDURE INSTRUCTIONS   You may apply an ice pack 20-30 minutes at a time to the injection site if you experience soreness.     Keep the injection site clean and dry. You make remove the band-aid one day following the procedure.     You may take a shower but AVOID getting in to baths, pools or whirlpools for 48 HOURS AFTER THE PROCEDURE.     Remember that it takes a few days, even up to a week for the steroid medicine to reduce inflammation and pain. If you are diabetic, the steroid medicine may cause your blood sugar levels to increase. Please contact your internist regarding medication adjustments.    ACTIVITY   Refrain from heavy activity for the next 24 to 48 hours. General walking is okay. You may resume your normal activities the day following the procedure.   You may start or resume your individualized exercise program or physical therapy 48 hours after the procedure.   MEDICATIONS   Please note that it is okay to continue other prescribed medications (blood pressure, insulin, water pill, depression/anxiety pill, etc.) as well as other prescribed pain medications such as Neurontin, Lyrical, Celebrex, Ultram, Vicodin, Norco and acetaminophen (Tylenol).   SIDE EFFECTS   Increase in pain during the first 24 to 48 hours.     Trouble sleeping during the first one or two nights after the procedure-this is an effect of the steroid medicine.     Facial flushing (it feels like you have a fever, but you do not) - this is an effect of the steroid medicine.     You might experience:   1. Mild to moderate swelling at the joint.   2. Possible bruising at the injection site.     WHEN TO CALL THE DOCTOR/NURSE   Severe pain, worse or different that the pain you had before the procedure.     Fever or chills.     Redness, or swelling around the injection site.     Call the Pain Management  Procedural Nurses [phone] during normal business hours (7 am-3 pm). If it is AFTER HOURS or during a weekend or holiday, call the hospital operator and ask for the Anesthesia Pain physician on call at [phone].   FOR EMERGENCIES, CALL 911 OR GO TO THE NEAREST HOSPITAL EMERGENCY DEPARTMENT.   ?   TO SCHEDULE APPOINTMENTS OR FOR QUESTIONS RELATED TO MEDICATIONS   Call the [facility] Management Clinic at [phone]   POST SEDATION GUIDELINES/DISCHARGE INSTRUCTIONS    The medicines given to you today for your procedure will stay in your body for some time. You may feel dizzy or lose your sense of balance. The use of your muscles may be changed. Your judgment may be affected. Your reaction time, for example, when driving a car, will be slower. You may not see any of these changes in yourself. In general, you should completely recover from these medicines by tomorrow. For your own safety, we have some strict rules for you to follow for the next 12 hours.      The 7D???s  Do not DRIVE.    Do not use appliances or equipment that could be DANGEROUS, like power tools, stove, burners, lawnmowers, garbage disposals.     Watch out for DIZZINESS. Walk slowly and take your time. Sudden changes of position can also cause nausea.     Do not make any important DECISIONS. You may change your mind tomorrow.  Do not DRINK alcoholic beverages. The drugs in your body may cause your reaction to alcohol to be dangerous.     DIET: If you feel nauseated or ???sick to your stomach???, drink clear liquids like 7-Up, broth, apple juice, ginger ale, tea, and cola or eat jello. If these liquids do not make you ???sick to your stomach???, try eating soft foods like potatoes, rice, pasta and cereal. Tomorrow you can eat regular foods.    DISCUSS any questions you may have with your primary care doctor or the doctor who ordered your test today.

## 2022-01-09 ENCOUNTER — Encounter
Admit: 2022-01-09 | Discharge: 2022-01-22 | Disposition: A | Discharge status: Home / Self Care | Payer: PRIVATE HEALTH INSURANCE | Attending: Certified Registered"

## 2022-01-09 ENCOUNTER — Ambulatory Visit: Admit: 2022-01-09 | Payer: PRIVATE HEALTH INSURANCE

## 2022-01-09 ENCOUNTER — Ambulatory Visit
Admit: 2022-01-09 | Discharge: 2022-01-22 | Disposition: A | Discharge status: Home / Self Care | Payer: PRIVATE HEALTH INSURANCE

## 2022-01-09 ENCOUNTER — Encounter
Admit: 2022-01-09 | Discharge: 2022-01-22 | Disposition: A | Discharge status: Home / Self Care | Payer: PRIVATE HEALTH INSURANCE

## 2022-01-09 DIAGNOSIS — K317 Polyp of stomach and duodenum: Secondary | ICD-10-CM | POA: Diagnosis not present

## 2022-01-09 DIAGNOSIS — R109 Unspecified abdominal pain: Secondary | ICD-10-CM | POA: Diagnosis not present

## 2022-01-09 DIAGNOSIS — Z980 Intestinal bypass and anastomosis status: Secondary | ICD-10-CM | POA: Diagnosis not present

## 2022-01-09 DIAGNOSIS — D48118 Desmoid tumor of other site: Secondary | ICD-10-CM | POA: Diagnosis not present

## 2022-01-09 DIAGNOSIS — D4819 Other specified neoplasm of uncertain behavior of connective and other soft tissue: Secondary | ICD-10-CM | POA: Diagnosis not present

## 2022-01-09 DIAGNOSIS — S3993XA Unspecified injury of pelvis, initial encounter: Secondary | ICD-10-CM | POA: Diagnosis not present

## 2022-01-09 DIAGNOSIS — K625 Hemorrhage of anus and rectum: Secondary | ICD-10-CM | POA: Diagnosis not present

## 2022-01-09 DIAGNOSIS — K633 Ulcer of intestine: Secondary | ICD-10-CM | POA: Diagnosis not present

## 2022-01-09 DIAGNOSIS — D48114 Desmoid tumor, intraabdominal: Secondary | ICD-10-CM | POA: Diagnosis not present

## 2022-01-09 DIAGNOSIS — D48119 Desmoid tumor of unspecified site: Secondary | ICD-10-CM | POA: Diagnosis not present

## 2022-01-09 DIAGNOSIS — K2930 Chronic superficial gastritis without bleeding: Secondary | ICD-10-CM | POA: Diagnosis not present

## 2022-01-09 DIAGNOSIS — Z9049 Acquired absence of other specified parts of digestive tract: Secondary | ICD-10-CM | POA: Diagnosis not present

## 2022-01-09 DIAGNOSIS — M62838 Other muscle spasm: Secondary | ICD-10-CM | POA: Diagnosis not present

## 2022-01-09 DIAGNOSIS — S3991XA Unspecified injury of abdomen, initial encounter: Secondary | ICD-10-CM | POA: Diagnosis not present

## 2022-01-09 DIAGNOSIS — D48111 Desmoid tumor of chest wall: Secondary | ICD-10-CM | POA: Diagnosis not present

## 2022-01-09 DIAGNOSIS — D500 Iron deficiency anemia secondary to blood loss (chronic): Secondary | ICD-10-CM | POA: Diagnosis not present

## 2022-01-09 DIAGNOSIS — K603 Anal fistula: Secondary | ICD-10-CM | POA: Diagnosis not present

## 2022-01-09 DIAGNOSIS — R1031 Right lower quadrant pain: Secondary | ICD-10-CM | POA: Diagnosis not present

## 2022-01-09 DIAGNOSIS — Z681 Body mass index (BMI) 19 or less, adult: Secondary | ICD-10-CM | POA: Diagnosis not present

## 2022-01-09 DIAGNOSIS — R55 Syncope and collapse: Secondary | ICD-10-CM | POA: Diagnosis not present

## 2022-01-09 DIAGNOSIS — E43 Unspecified severe protein-calorie malnutrition: Secondary | ICD-10-CM | POA: Diagnosis not present

## 2022-01-09 DIAGNOSIS — Q8789 Other specified congenital malformation syndromes, not elsewhere classified: Secondary | ICD-10-CM | POA: Diagnosis not present

## 2022-01-09 DIAGNOSIS — D235 Other benign neoplasm of skin of trunk: Secondary | ICD-10-CM | POA: Diagnosis not present

## 2022-01-09 DIAGNOSIS — K922 Gastrointestinal hemorrhage, unspecified: Secondary | ICD-10-CM | POA: Diagnosis not present

## 2022-01-09 DIAGNOSIS — E860 Dehydration: Secondary | ICD-10-CM | POA: Diagnosis not present

## 2022-01-09 DIAGNOSIS — R112 Nausea with vomiting, unspecified: Secondary | ICD-10-CM | POA: Diagnosis not present

## 2022-01-09 DIAGNOSIS — R1013 Epigastric pain: Secondary | ICD-10-CM | POA: Diagnosis not present

## 2022-01-09 DIAGNOSIS — D62 Acute posthemorrhagic anemia: Secondary | ICD-10-CM | POA: Diagnosis not present

## 2022-01-09 DIAGNOSIS — D48113 Desmoid tumor of abdominal wall: Secondary | ICD-10-CM | POA: Diagnosis not present

## 2022-01-09 DIAGNOSIS — D649 Anemia, unspecified: Secondary | ICD-10-CM | POA: Diagnosis not present

## 2022-01-09 DIAGNOSIS — K602 Anal fissure, unspecified: Secondary | ICD-10-CM | POA: Diagnosis not present

## 2022-01-09 DIAGNOSIS — K1230 Oral mucositis (ulcerative), unspecified: Secondary | ICD-10-CM | POA: Diagnosis not present

## 2022-01-09 DIAGNOSIS — L249 Irritant contact dermatitis, unspecified cause: Secondary | ICD-10-CM | POA: Diagnosis not present

## 2022-01-09 DIAGNOSIS — K921 Melena: Secondary | ICD-10-CM | POA: Diagnosis not present

## 2022-01-09 DIAGNOSIS — D692 Other nonthrombocytopenic purpura: Secondary | ICD-10-CM | POA: Diagnosis not present

## 2022-01-09 DIAGNOSIS — R197 Diarrhea, unspecified: Secondary | ICD-10-CM | POA: Diagnosis not present

## 2022-01-09 DIAGNOSIS — G8929 Other chronic pain: Secondary | ICD-10-CM | POA: Diagnosis not present

## 2022-01-09 DIAGNOSIS — R1084 Generalized abdominal pain: Secondary | ICD-10-CM | POA: Diagnosis not present

## 2022-01-09 DIAGNOSIS — Z932 Ileostomy status: Secondary | ICD-10-CM | POA: Diagnosis not present

## 2022-01-09 DIAGNOSIS — D1391 Familial adenomatous polyposis: Secondary | ICD-10-CM | POA: Diagnosis not present

## 2022-01-09 DIAGNOSIS — R262 Difficulty in walking, not elsewhere classified: Secondary | ICD-10-CM | POA: Diagnosis not present

## 2022-01-09 DIAGNOSIS — D126 Benign neoplasm of colon, unspecified: Secondary | ICD-10-CM | POA: Diagnosis not present

## 2022-01-09 DIAGNOSIS — Z452 Encounter for adjustment and management of vascular access device: Secondary | ICD-10-CM | POA: Diagnosis not present

## 2022-01-09 NOTE — Unmapped (Signed)
[facility]   Medical Nutrition Therapy Consultation       Visit Type:    Return Assessment    Referral Reason:   Follow-up with tube feeding       [name] [name] is a 21 y.o. 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 S/p robotic assisted laparoscopic proctocolectomy, ileal J pouch, with ostomy 08/2020. Ileostomy take down 10/09/20. Hospitalization for abdominal pain, malnutrition 9/28-10/13/23 Pouchoscopy 10/4 without stricture. Initiated on tube feeds. Sorafenib was initiated ~ 10/3 and tolerated reasonably well in house. She is seen for medical nutrition therapy to follow-up on tube feeding tolerance and po intake. Her active problem list, medication list, allergies, social history, notes from last a few encounters, and lab results were reviewed.     Past Medical History:   Diagnosis Date    Abdominal pain     Acid reflux     occas    Anesthesia complication     itching, shaking, coldness; last few surgeries have gone much better    Cancer (CMS-HCC)     Cataract of right eye     COVID-19 virus infection 01/2019    Cyst of thyroid determined by ultrasound     monitoring    [name]     2 right forearm, 1 left thigh, 1 right scapula, 1 under left clavicle; multiple    Difficult intravenous access     [name] (familial adenomatous polyposis)     [name]     Gastric polyps     History of chemotherapy     last treatment approx 05/2019    History of colon polyps     History of COVID-19 01/2019    Iron deficiency anemia due to chronic blood loss     received iron infusion 11-2019    PONV (postoperative nausea and vomiting)     Rectal bleeding     Syncopal episodes     especially if becoming dehydrated       Anthropometrics   Height: 165.1 cm (5' 5)    Current Weight: 111 lbs (50.5 kg) reported wt at home today  BMI: 18.5 kg/m??   Usual Body Weight:  118-120 lb per pt  Recent Wt change: 1.8 kg (3.4%) wt loss in past 1 week.     Weight history:  Wt Readings from Last 6 Encounters:   01/02/22 52.3 kg (115 lb 6.4 oz)   12/31/21 52.2 kg (115 lb 1.6 oz)   12/26/21 52.2 kg (115 lb)   12/19/21 50.7 kg (111 lb 12.8 oz)   12/03/21 51.6 kg (113 lb 12.8 oz)   10/23/21 52.3 kg (115 lb 6.4 oz)       Nutrition Risk Screening:   Food Insecurity: No Food Insecurity (08/30/2021)    Hunger Vital Sign     Worried About Running Out of Food in the Last Year: Never true     Ran Out of Food in the Last Year: Never true        Nutrition-Focused Physical Findings       Physical Activity:  Physical activity level is sedentary with little to no exercise.  Pt is too weak to do more than ADLs      ASPEN/AND Malnutrition Screening:   Pt meets ASPEN/AND criteria for severe protein-calorie malnutrition in context of acute illness or injury.  -- Weight loss: >2% in 1 week (3.4%)   -- Energy Intake: <=50% EER for >=5 days  -- Nutrition-Focused Physical  Exam not performed due to phone consult.      Biochemical Data, Medical Tests and Procedures:  All pertinent labs and imaging reviewed by Renda Rolls Hazaiah Edgecombe, RD/LDN at 1:00 PM 01/06/2022.      Medications and Vitamin/Mineral Supplementation:   All nutritionally pertinent medications reviewed on 01/06/2022.   Nutritionally pertinent medications include: as noted in current medication list below  She is not taking nutrition supplements.     Current Outpatient Medications   Medication Sig Dispense Refill    acetaminophen (TYLENOL) 500 MG tablet Take 2 tablets (1,000 mg total) by mouth two (2) times a day.      baclofen (LIORESAL) 5 mg Tab tablet Take 1-2 tablets (5-10 mg total) by mouth two (2) times a day as needed for muscle spasms. 40 tablet 0    cefdinir (OMNICEF) 300 MG capsule Take 1 capsule (300 mg total) by mouth Two (2) times a day. 180 capsule 1    clobetasoL (CLOBEX) 0.05 % shampoo Apply topically two (2) times a day. (Patient not taking: Reported on 01/02/2022) 118 mL 0    clobetasoL (TEMOVATE) 0.05 % external solution Apply topically two (2) times a day. To affected areas on scalp until clear/not itchy. Restart as needed. Not for normal skin. 50 mL 6    COURIERED MED OR SUPPLY [id number], SORAFENIB 200MG  TABLET 1 each 0    diclofenac sodium (VOLTAREN) 1 % gel Apply 2 g topically four (4) times a day. 100 g 0    famotidine (PEPCID) 20 MG tablet Take 1 tablet (20 mg total) by mouth nightly. 90 tablet 3    gabapentin (NEURONTIN) 100 MG capsule Take 100mg  in the morning and afternoon. Take 300 mg at night. 120 capsule 3    hydrocortisone 2.5 % cream Apply 1 application. topically daily as needed.      HYDROmorphone (DILAUDID) 2 MG tablet Take 1.5 tablets (3 mg total) by mouth every three (3) hours as needed for moderate pain or severe pain. 120 tablet 0    hydrOXYzine (ATARAX) 25 MG tablet Take 1 tablet (25 mg total) by mouth every six (6) hours as needed for itching or anxiety (also insomnia). 20 tablet 0    hyoscyamine (LEVSIN) 0.125 mg tablet Take 1 tablet (0.125 mg total) by mouth every four (4) hours as needed for cramping.      lidocaine (LIDODERM) 5 % patch Place 1 patch on the skin daily. Apply to affected area for 12 hours only each day (then remove patch)      menthol-zinc oxide (CALMOSEPTINE) 0.44-20.6 % Oint Apply topically four (4) times a day as needed. 113 g 11    mirtazapine (REMERON) 7.5 MG tablet Take 1 tablet (7.5 mg total) by mouth nightly. 90 tablet 3    naloxone (NARCAN) 4 mg nasal spray One spray in either nostril once for known/suspected opioid overdose. May repeat every 2-3 minutes in alternating nostril til EMS arrives 2 each 0    OLANZapine zydis (ZYPREXA) 5 MG disintegrating tablet Take 1 tablet (5 mg total) by mouth nightly. 30 tablet 2    ondansetron (ZOFRAN-ODT) 8 MG disintegrating tablet Take 1 tablet (8 mg total) by mouth every twelve (12) hours as needed for nausea.      oxyCODONE (ROXICODONE) 10 mg immediate release tablet Take 1 tablet (10 mg total) by mouth every four (4) hours as needed for pain. Fill on or after: 01/01/22 180 tablet 0    pimecrolimus (ELIDEL) 1 % cream Apply topically two (  2) times a day. To face as needed for rash 30 g 11    silver sulfaDIAZINE (SILVADENE) 1 % cream Apply topically daily. To burn on leg 50 g 11    SORAfenib (NEXAVAR) 200 mg tablet Take 1 tablet (200 mg total) by mouth daily. 60 tablet 2    triamcinolone (KENALOG) 0.025 % ointment Apply two times a day as needed to the body for itchy skin or rash. (Patient not taking: Reported on 01/03/2022) 30 g 0    triamcinolone (KENALOG) 0.1 % cream Apply topically two (2) times a day. (Patient not taking: Reported on 01/03/2022) 28 g 3    zinc oxide 10 % Crea Apply 1 application. topically daily as needed.       Current Facility-Administered Medications   Medication Dose Route Frequency Provider Last Rate Last Admin    ketorolac (TORADOL) injection 15 mg  15 mg Intravenous Once Nat Christen., MD           Nutrition History:     Dietary Restrictions: She reported food allergies to papaya and IV iron (Ferrlecit)    Gastrointestinal Issues: Diarrhea - down to 10-11 loose BM's per day at present; Heartburn- Reflux at night; Abdominal pain with po diet and tube feedings continues.    Oral Issues: dry mouth    Hunger and Satiety: Poor appetite and limited intake.   Early satiety.    Food Safety and Access: No to little issues noted.  She did not report issues.     Other: pain      Diet Recall:   Bites of stage 1 baby food (fruit and vegetable), bite of taco and banana  Beverages: sips of sweet tea and water  Supplements: none at present  Enteral feedings:  Osmolite 1.5 at 50 mL / hr X 16 hrs daily. 30 mL FWF before and after tube feeding. Pt reports that she often does not complete the feeding or skips it if she has abdominal pain. Pt reports trying  the Ensure Original (1.0 cal) nutritional supplement for tube feedings with same result.    Has been on TPN in the past.    Nutrition Assessment       Severe protein calorie malnutrition as related to side effects of desmoid tumors as evidenced by pt meeting <=50% EER with severe wt loss in the past week. Fluid intake continues to be suboptimal.    Recommend pt have bowel rest with TPN for alternative nutrition support.    Daily Estimated Nutrition Needs:  Calories: 30-35 kcals/ kg using actual wt  Protein: 1.2-1.5 g/ kg using actual wt  Carbohydrate: no restriction   Fluid: 1 mL/ kcal, maintenance      Standard multivitamin for adults to meet 100% RDI for vitamins and minerals if pt is not able to eat a regular diet or tolerate >3 Osmolite 1.5 enteral formula or comparable per day      Nutrition Goals & Evaluation      -Tolerate tube feeding at goal rate (Not Met and Ongoing)  - Patient to consume >= 50% of po intake via combination of meals, snacks, and/or oral supplements (Not Met and Ongoing)  - Adequate fluid intake  (Not Met and Ongoing)    Nutrition goals reviewed, and relevant barriers identified and addressed: acuity of illness. She is evaluated to have fair to poor willingness and ability to achieve nutrition goals.     Nutrition Intervention      - Nutrition Counseling: Motivational Interviewing  Goal setting Problem solving  - Enteral Nutrition    Nutrition Plan:   Continue Osmolite 1.5 at 50 mL/ hr X 16 hours with 30 mL FWF before and after each feeding as tolerated.  Continue po diet of soft, bland and easy to digest foods as tolerated  Continue to sip fluids throughout the day aiming to get at least 5 cups of additional fluid po or via g-tube as tolerated to meet fluid needs.  RD to contact medical team via [employer] regarding patient's poor nutritional status.    Materials Provided:  None given today    Follow up prn per pt preference..       Food/Nutrition-related history, Anthropometric measurements, Biochemical data, medical tests, procedures, Nutrition-focused physical findings, and Effectiveness of nutrition interventions will be assessed at time of follow-up.     Recommendations for Clinical Team, Care Team , and Referring Provider :  Recommend TPN for alternative nutrition support.       Time spent 10 minutes

## 2022-01-09 NOTE — Unmapped (Signed)
Spoke with Jacki Cones, RN, who received call from patient regarding yesterday's procedure. Relayed information to [name] via text, and he will call the patient.

## 2022-01-09 NOTE — Unmapped (Signed)
01/08/2022: Patient had Bilateral Celiac Plexus Block with Drs. [name] and [employer].    01/09/22 at 13:30:   Patient called  Procedure Nurse Line to report diffuse abdominal pain most notably right upper quadrant (this is a 10/10) and characterized as sharp and deep. Patient endorses pain of her back, most notably on the right lower side and right posterior leg.  Patient reports difficulty with walking associated with the pain. Forehead temperature while speaking with patient ranged 99-99.5.      Patient taking Oxycodone every four hours (last dose 10:30 am), baclofen (last dose at 12:30), patient took GasX, and had been using heat to abdomen and back. Patient states she will take dilaudid at 2:30 (four hours post last dose of Oxy).  I informed patient that I will notify [name] of this phone call.  Patient requested that return phone call be made to her mother's # [phone].

## 2022-01-09 NOTE — Unmapped (Signed)
The patient and her mother called our clinic this afternoon at approximately 1:30 PM.  I received a message from our procedural nurses noting that [name] [name] has continued to experience 10/10 pain since her procedure yesterday and has had diffuse abdominal pain and right-sided low back pain.    I called the patient and her mother today around 2:30 PM and spoke with them for approximately 22 minutes on the phone.    For context, and please refer to the procedure note dated 01/08/2022 for more details, the patient received a celiac plexus block utilizing 5 inch 22-gauge Quincke needles bilaterally at the inferior portion of the L1 vertebral body from a posterolateral approach.  The patient exhibited hyperalgesia throughout the procedure and required 100 mcg of IV fentanyl as well as multiple rounds of topicalization to tolerate the procedure.  She also had marked muscle tightness, right greater than left, while trying to pass the quick ease through her back musculature to the inferior aspect of the L1 vertebral body.  Furthermore, she endorsed severe but superficial abdominal pain likely consistent with musculoskeletal abdominal pain as well as bilateral low back pain after the procedure.  Her mother gave her a dose of her home baclofen while we observed her for 30 minutes.  Her pain appeared to stabilize somewhat and she was able to tolerate going home thereafter.    Her mother states that last night her abdominal and back pain seemed to be somewhat improved and that Shayda's primary pain generator was posterior leg pain from the right low back to the knee.  However, starting this morning, [name] [name] states that she is started to also experience right back pain that radiates laterally from her injection site in addition to the posterior right leg pain that radiates from the right lower back into the right lower extremity to the level of the knee as well as left lower quadrant, left upper quadrant, and right upper quadrant abdominal pain that she describes as deep and sharp in character.  She rates the pain as a 10/10 and notes that it has been increasing all day despite taking her oxycodone 10 mg as prescribed and baclofen 5 mg every 8 hours and Tylenol.    I informed her that the distribution of her pain and tingling in the right lower extremity correlates to the S1 dermatome distribution but reassured her that, because we were operating far from the spinal cord at the level of L1, that this pain is very unlikely to be radicular in nature.  Additionally, the pain that radiates laterally from the right injection site appears to be worse with light palpation and the patient endorses feeling her muscles twitching frequently.  Additionally, while the patient states that she has difficulty feeling the need to urinate at baseline since her colectomy, she denies any change in the color of her urine, specifically she denies any blood in her urine.  This argues against renal injury from the procedure.  Finally, the patient endorses difficulty with flexing her hip.  Given the trajectory of the needle, it is possible that we traversed the right iliopsoas muscle which could help explain this symptom.  Taken together, her right lower back pain with its radiation patterns appear most consistent with muscle spasms and referred pain from these spasms and should improve over the coming days.  Should additional treatment options be indicated based off of evaluation at an emergency department or urgent care, providers could consider a rotation from baclofen 10 mg as needed to  tizanidine 4 mg.  Additionally, a Toradol injection could be considered as well.  Given that the patient received Decadron with her celiac plexus block, additional steroids such as a Medrol Dosepak would be unlikely to help.    Regarding her abdominal symptoms, she notes that her ostomy output has decreased today and notes that her pain has flared in the left lower quadrant, left upper quadrant, and right upper quadrant.  It is possible that the abdominal pains that she is experiencing could be due to her pre-existing desmoid tumors, versus abdominal wall muscle spasms with an atypical presentation, versus rebound pain after the local anesthetic from the celiac plexus block is worn off and prior to her obtaining benefit from the steroid deposited (which may take 3 to 5 days to take effect), versus possible inadvertent injury to intra-abdominal contents from the procedure.  However, based off the trajectory of the needles, it is unlikely that she sustained injury to her intra-abdominal organs.  Additionally, her mother notes that she auscultated the patient's abdomen and noted normal bowel sounds.  She took Gas-X this morning and it did not help, arguing against gas related pain.  It is possible that her lower ostomy output is due to a sympathetic surge in the setting of stress response, acute pain, and anxiety and may improve as the subside.    Of note, after her first trigger point injection procedure in the summer she had a profound muscle spasm related pain flare after the procedure that subsequently improved and she then has had repeat trigger point injections with no issues and better analgesia with each subsequent treatment.  It may be that she is experiencing muscle spasms in a similar fashion at this juncture.    Additionally, the patient's mother notes that she has had a forehead temperature of 99-99.5 F. I noted that it would be atypical for an infection from a procedure to manifest this early but if this were to continue, [name] [name] would need to seek immediate evaluation.    Ultimately, I recommended that the patient increase her baclofen dose from 5 mg 3 times daily as needed to 10 mg 3 times daily as needed and to start rotating between oxycodone and p.o. Dilaudid.  She will take oxycodone at hour 0, baclofen at our 2, p.o. Dilaudid at hour 4, APAP at hour 6, and restart the cycle at that time.  We discussed that if the Dilaudid that she will take at approximately 3 PM does not help her pain and if it continues to get worse or remain the same then she should seek evaluation at an emergency department as she may require Toradol as above, additional pain medicines including opioids (though she is under contract with our clinic, it is perfectly acceptable for an ED provider to prescribe additional short-term opioids for her acute pain flare), and if the pain is still this significant then I recommended that she be evaluated for possible CT scan to rule out inadvertent damage from the procedure.  I stated that I would call the patient and her mother back on 01/10/2022 to follow-up as well.  The patient and her mother expressed appreciation for the phone call.    Nat Christen, MD  Assistant Professor of Anesthesiology  Department of Anesthesiology   Divisions of General Anesthesiology and Pain Medicine

## 2022-01-10 DIAGNOSIS — R109 Unspecified abdominal pain: Secondary | ICD-10-CM | POA: Diagnosis not present

## 2022-01-10 DIAGNOSIS — Q8789 Other specified congenital malformation syndromes, not elsewhere classified: Secondary | ICD-10-CM | POA: Diagnosis not present

## 2022-01-10 DIAGNOSIS — E43 Unspecified severe protein-calorie malnutrition: Secondary | ICD-10-CM | POA: Diagnosis not present

## 2022-01-10 DIAGNOSIS — D48119 Desmoid tumor of unspecified site: Secondary | ICD-10-CM | POA: Diagnosis not present

## 2022-01-10 LAB — CBC W/ AUTO DIFF
BASOPHILS ABSOLUTE COUNT: 0 10*9/L (ref 0.0–0.1)
BASOPHILS ABSOLUTE COUNT: 0 10*9/L (ref 0.0–0.1)
BASOPHILS RELATIVE PERCENT: 0.2 %
BASOPHILS RELATIVE PERCENT: 0.3 %
EOSINOPHILS ABSOLUTE COUNT: 0 10*9/L (ref 0.0–0.5)
EOSINOPHILS ABSOLUTE COUNT: 0.1 10*9/L (ref 0.0–0.5)
EOSINOPHILS RELATIVE PERCENT: 0.2 %
EOSINOPHILS RELATIVE PERCENT: 0.4 %
HEMATOCRIT: 36.6 % (ref 34.0–44.0)
HEMATOCRIT: 36.4 % (ref 34.0–44.0)
HEMOGLOBIN: 12.6 g/dL (ref 11.3–14.9)
HEMOGLOBIN: 12.5 g/dL (ref 11.3–14.9)
LYMPHOCYTES ABSOLUTE COUNT: 2.8 10*9/L (ref 1.1–3.6)
LYMPHOCYTES ABSOLUTE COUNT: 3.6 10*9/L (ref 1.1–3.6)
LYMPHOCYTES RELATIVE PERCENT: 21.3 %
LYMPHOCYTES RELATIVE PERCENT: 25 %
MEAN CORPUSCULAR HEMOGLOBIN CONC: 34.4 g/dL (ref 32.0–36.0)
MEAN CORPUSCULAR HEMOGLOBIN CONC: 34.4 g/dL (ref 32.0–36.0)
MEAN CORPUSCULAR HEMOGLOBIN: 29.5 pg (ref 25.9–32.4)
MEAN CORPUSCULAR HEMOGLOBIN: 29.2 pg (ref 25.9–32.4)
MEAN CORPUSCULAR VOLUME: 85.9 fL (ref 77.6–95.7)
MEAN CORPUSCULAR VOLUME: 84.9 fL (ref 77.6–95.7)
MEAN PLATELET VOLUME: 10.4 fL (ref 6.8–10.7)
MEAN PLATELET VOLUME: 9.8 fL (ref 6.8–10.7)
MONOCYTES ABSOLUTE COUNT: 1 10*9/L — ABNORMAL HIGH (ref 0.3–0.8)
MONOCYTES ABSOLUTE COUNT: 1.4 10*9/L — ABNORMAL HIGH (ref 0.3–0.8)
MONOCYTES RELATIVE PERCENT: 7.5 %
MONOCYTES RELATIVE PERCENT: 9.8 %
NEUTROPHILS ABSOLUTE COUNT: 9.4 10*9/L — ABNORMAL HIGH (ref 1.8–7.8)
NEUTROPHILS ABSOLUTE COUNT: 9.2 10*9/L — ABNORMAL HIGH (ref 1.8–7.8)
NEUTROPHILS RELATIVE PERCENT: 70.8 %
NEUTROPHILS RELATIVE PERCENT: 64.5 %
PLATELET COUNT: 202 10*9/L (ref 150–450)
PLATELET COUNT: 183 10*9/L (ref 150–450)
RED BLOOD CELL COUNT: 4.26 10*12/L (ref 3.95–5.13)
RED BLOOD CELL COUNT: 4.29 10*12/L (ref 3.95–5.13)
RED CELL DISTRIBUTION WIDTH: 12.6 % (ref 12.2–15.2)
RED CELL DISTRIBUTION WIDTH: 12.3 % (ref 12.2–15.2)
WBC ADJUSTED: 13.3 10*9/L — ABNORMAL HIGH (ref 3.6–11.2)
WBC ADJUSTED: 14.2 10*9/L — ABNORMAL HIGH (ref 3.6–11.2)

## 2022-01-10 LAB — COMPREHENSIVE METABOLIC PANEL
ALBUMIN: 3.6 g/dL (ref 3.4–5.0)
ALBUMIN: 3.6 g/dL (ref 3.4–5.0)
ALKALINE PHOSPHATASE: 65 U/L (ref 46–116)
ALKALINE PHOSPHATASE: 68 U/L (ref 46–116)
ALT (SGPT): 38 U/L (ref 10–49)
ALT (SGPT): 31 U/L (ref 10–49)
ANION GAP: 7 mmol/L (ref 5–14)
ANION GAP: 9 mmol/L (ref 5–14)
AST (SGOT): 18 U/L (ref <=34)
BILIRUBIN TOTAL: 0.5 mg/dL (ref 0.3–1.2)
BILIRUBIN TOTAL: 0.3 mg/dL (ref 0.3–1.2)
BLOOD UREA NITROGEN: 13 mg/dL (ref 9–23)
BLOOD UREA NITROGEN: 10 mg/dL (ref 9–23)
BUN / CREAT RATIO: 28
BUN / CREAT RATIO: 20
CALCIUM: 8.4 mg/dL — ABNORMAL LOW (ref 8.7–10.4)
CALCIUM: 8.5 mg/dL — ABNORMAL LOW (ref 8.7–10.4)
CHLORIDE: 109 mmol/L — ABNORMAL HIGH (ref 98–107)
CHLORIDE: 108 mmol/L — ABNORMAL HIGH (ref 98–107)
CO2: 23 mmol/L (ref 20.0–31.0)
CO2: 24 mmol/L (ref 20.0–31.0)
CREATININE: 0.47 mg/dL — ABNORMAL LOW
CREATININE: 0.49 mg/dL — ABNORMAL LOW
EGFR CKD-EPI (2021) FEMALE: >90 mL/min/{1.73_m2} (ref >=60)
EGFR CKD-EPI (2021) FEMALE: >90 mL/min/{1.73_m2} (ref >=60)
GLUCOSE RANDOM: 118 mg/dL (ref 70–179)
GLUCOSE RANDOM: 99 mg/dL (ref 70–179)
POTASSIUM: 3.5 mmol/L (ref 3.4–4.8)
PROTEIN TOTAL: 6.7 g/dL (ref 5.7–8.2)
PROTEIN TOTAL: 6.4 g/dL (ref 5.7–8.2)
SODIUM: 139 mmol/L (ref 135–145)
SODIUM: 141 mmol/L (ref 135–145)

## 2022-01-10 LAB — URINALYSIS WITH MICROSCOPY WITH CULTURE REFLEX
BACTERIA: NONE SEEN /HPF
BILIRUBIN UA: NEGATIVE
BLOOD UA: NEGATIVE
GLUCOSE UA: NEGATIVE
KETONES UA: NEGATIVE
LEUKOCYTE ESTERASE UA: NEGATIVE
NITRITE UA: NEGATIVE
PH UA: 6 (ref 5.0–9.0)
RBC UA: 3 /HPF (ref <=4)
SPECIFIC GRAVITY UA: 1.031 — ABNORMAL HIGH (ref 1.003–1.030)
SQUAMOUS EPITHELIAL: 3 /HPF (ref 0–5)
UROBILINOGEN UA: <2
WBC UA: 2 /HPF (ref 0–5)

## 2022-01-10 LAB — LIPASE: LIPASE: 46 U/L (ref 12–53)

## 2022-01-10 LAB — HCG QUANTITATIVE, BLOOD: GONADOTROPIN, CHORIONIC (HCG) QUANT: <2.6 m[IU]/mL

## 2022-01-10 LAB — MAGNESIUM: MAGNESIUM: 2.1 mg/dL (ref 1.6–2.6)

## 2022-01-10 LAB — POTASSIUM: POTASSIUM: 3.4 mmol/L (ref 3.4–4.8)

## 2022-01-10 LAB — AST: AST (SGOT): 18 U/L (ref <=34)

## 2022-01-10 LAB — PHOSPHORUS: PHOSPHORUS: 3 mg/dL (ref 2.4–5.1)

## 2022-01-10 LAB — PREGNANCY, URINE: PREGNANCY TEST URINE: NEGATIVE

## 2022-01-10 MED ADMIN — lactated ringers bolus 1,000 mL: 1000 mL | INTRAVENOUS | @ 06:00:00 | Stop: 2022-01-10

## 2022-01-10 MED ADMIN — HYDROmorphone (PF) (DILAUDID) injection 1 mg: 1 mg | INTRAVENOUS | @ 06:00:00 | Stop: 2022-01-10

## 2022-01-10 MED ADMIN — baclofen (LIORESAL) tablet 10 mg: 10 mg | ORAL | @ 20:00:00

## 2022-01-10 MED ADMIN — ondansetron (ZOFRAN) injection 4 mg: 4 mg | INTRAVENOUS | @ 06:00:00 | Stop: 2022-01-10

## 2022-01-10 MED ADMIN — ondansetron (ZOFRAN) injection 4 mg: 4 mg | INTRAVENOUS | @ 16:00:00

## 2022-01-10 MED ADMIN — HYDROmorphone (PF) (DILAUDID) injection 1 mg: 1 mg | INTRAVENOUS | @ 23:00:00 | Stop: 2022-01-15

## 2022-01-10 MED ADMIN — diphenhydrAMINE (BENADRYL) injection 25 mg: 25 mg | INTRAVENOUS | @ 09:00:00 | Stop: 2022-01-10

## 2022-01-10 MED ADMIN — cefdinir (OMNICEF) capsule 300 mg: 300 mg | ORAL | @ 16:00:00 | Stop: 2022-02-09

## 2022-01-10 MED ADMIN — ondansetron (ZOFRAN) injection 4 mg: 4 mg | INTRAVENOUS | @ 08:00:00 | Stop: 2022-01-10

## 2022-01-10 MED ADMIN — HYDROmorphone (PF) (DILAUDID) injection 1.5 mg: 1.5 mg | INTRAVENOUS | @ 20:00:00 | Stop: 2022-01-10

## 2022-01-10 MED ADMIN — fentaNYL (PF) (SUBLIMAZE) injection: INTRAVENOUS | @ 18:00:00 | Stop: 2022-01-10

## 2022-01-10 MED ADMIN — iohexoL (OMNIPAQUE) 350 mg iodine/mL solution 100 mL: 100 mL | INTRAVENOUS | @ 10:00:00 | Stop: 2022-01-10

## 2022-01-10 MED ADMIN — heparin lock flush (porcine) 100 unit/mL injection: INTRAVENOUS | @ 18:00:00 | Stop: 2022-01-10

## 2022-01-10 MED ADMIN — midazolam (VERSED) injection: INTRAVENOUS | @ 17:00:00 | Stop: 2022-01-10

## 2022-01-10 MED ADMIN — diphenhydrAMINE (BENADRYL) injection: 25 mg | INTRAVENOUS | @ 20:00:00 | Stop: 2022-01-10

## 2022-01-10 MED ADMIN — HYDROmorphone (PF) (DILAUDID) injection 1.5 mg: 1.5 mg | INTRAVENOUS | @ 11:00:00 | Stop: 2022-01-10

## 2022-01-10 MED ADMIN — ketorolac (TORADOL) injection 15 mg: 15 mg | INTRAVENOUS | @ 22:00:00 | Stop: 2022-01-11

## 2022-01-10 MED ADMIN — HYDROmorphone (PF) (DILAUDID) injection 1.5 mg: 1.5 mg | INTRAVENOUS | @ 16:00:00 | Stop: 2022-01-10

## 2022-01-10 MED ADMIN — lidocaine (XYLOCAINE) 10 mg/mL (1 %) injection: INTRADERMAL | @ 18:00:00 | Stop: 2022-01-10

## 2022-01-10 MED ADMIN — midazolam (VERSED) injection: INTRAVENOUS | @ 18:00:00 | Stop: 2022-01-10

## 2022-01-10 MED ADMIN — lactated Ringers infusion: 100 mL/h | INTRAVENOUS | @ 12:00:00 | Stop: 2022-01-10

## 2022-01-10 MED ADMIN — HYDROmorphone (PF) (DILAUDID) injection 1 mg: 1 mg | INTRAVENOUS | @ 07:00:00 | Stop: 2022-01-10

## 2022-01-10 MED ADMIN — sodium chloride (NS) 0.9 % infusion: INTRAVENOUS | @ 17:00:00 | Stop: 2022-01-10

## 2022-01-10 MED ADMIN — HYDROmorphone (PF) (DILAUDID) injection 1.5 mg: 1.5 mg | INTRAVENOUS | @ 13:00:00 | Stop: 2022-01-10

## 2022-01-10 MED ADMIN — acetaminophen (TYLENOL) tablet 1,000 mg: 1000 mg | ORAL | @ 20:00:00

## 2022-01-10 MED ADMIN — ondansetron (ZOFRAN) injection 4 mg: 4 mg | INTRAVENOUS | @ 12:00:00 | Stop: 2022-01-10

## 2022-01-10 MED ADMIN — HYDROmorphone (PF) (DILAUDID) injection 1.5 mg: 1.5 mg | INTRAVENOUS | @ 08:00:00 | Stop: 2022-01-10

## 2022-01-10 NOTE — Unmapped (Addendum)
Oncology Consult Note    Requesting Attending Physician :  Kateri Plummer, MD  Service Requesting Consult : Med Bernita Raisin [facility])  Reason for Consult: Continue home sorafenib  Primary Oncologist: Donzetta Sprung    Assessment: [name] [name] is a 21 y.o. 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 Patient recently seen (10/24) by Dr. [name] with plan to continue sorafenib.     Recommendations:   -Labs reviewed (CBC with WBC# 14.2, Cr 0.5, LFTs and UA reviewed, patient examined, OK to continue sorafenib at 200mg  daily. She notes she has had today's dose.    This patient has been staffed with Dr. Randolph Bing. These recommendations were discussed with the primary team.     Please contact the oncology consult fellow at [phone] with any further questions.    Philippa Sicks, MD PhD  Oncology Fellow    -------------------------------------------------------------    HPI: [name] [name] is a 21 y.o. female and who is being seen at the request of Kateri Plummer, MD for evaluation of desmoid fibromatosis iso [name].     She presents for LE and midbody after having celiac plexus block placed. Otherwise she is largely at baseline. She is starting TPN inpatient and hoping she can also tolerate PO intake while admitted.    Review of Systems: All positive and pertinent negatives are noted in the HPI; a 10 system review of systems was otherwise negative except as noted in HPI.    Oncologic History:  From 12/31/2021 Clinic list  DIAGNOSIS:  1. [name]    2. [name]    3. Acute deep vein thrombosis (DVT) of axillary vein of right upper extremity (CMS-HCC)    4. Chemotherapy-induced nausea    5. Syncope, unspecified syncope type    6. Iron deficiency anemia due to chronic blood loss    7. History of colectomy    8. Severe protein-calorie malnutrition (CMS-HCC)    9. Generalized anxiety disorder with panic attacks         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     Desmoid fibromatosis, history of [name]     She has had numerous sites of disease, including paraspinal, chest wall, right arm/wrist. She has had several cryoablations with Dr. Cathie Hoops which have seemed to offer clinical benefit. She trialed sorafenib which seemed to lead to shrinkage of lesions, but she had significant intolerance with dizziness, presyncope, fatigue, hair loss, rash, GI side effects.      We have discussed at length the nature of desmoid fibromatosis tumors, the fact they can wax and wane independent of therapy, are not cancers in a metastatic potential, but can be locally infiltrative and cause substantial morbidity to the site of disease and occasionally mortality. I reviewed that the emerging standard of care is observation based on the fact that 20-30% will experience spontaneous regression in the year following diagnosis. Given her long experience with desmoids, complete regression may be less likely, but we will still approach this cautiously with parallel goals of avoidance of overtreatment (particularly surgical) and palliation of symptoms / preservation of function.     Current lesions are in the shoulder, right forearm, chest area, with a possible desmoid in her left  thigh. Symptoms are manageable at the moment, after recent cryoablations and steroid injection to the forearm.     - Her poor tolerance of sorafenib was likely multifactorial: simultaneous COVID infection and difficulty with good supportive care given that she was in a busy college semester several hours away from her medical team. Would not rule out trying this again in the future with optimized supportive care  - Alternatively, pazopanib would be a reasonable option, per DESMOPAZ (82% nonprogression at 6 mos, similar RR to sorafenib)  - clinical trials, novel or off-label therapies may be reasonable if available: potential agents include DFMO, gamma secretase inhibitors, NOTCH pathway inhibitors     11/09/20: MRI without evidence of recurrent desmoids, stability of prior lesions.  02/14/21: Biopsy of thigh muscle thickening - benign, hypocellular fibrous tissue. Favor Gardner fibroma - on the desmoid spectrum but less cellular and less aggressive   02/14/21: Percutaneous steroid injection of forearm lesions.     03/19/21: Lots of active issues, although desmoids are not one of them. On TPN for diarrhea, vomiting, intolerance of food, significant malnutrition. Having to withdraw partially from school given medical issues.      06/21/21: Has had a complex few months, on TPN, significant fluid overload, caused worsening desmoid pain. Now off TPN, nutrition slowly improving, tumor pain better. Scans show 2 ill defined soft tissue nodules, concerning for recurrent desmoid. Start with surveillance, given risks of systemic therapy after recent significant GI and medical issues.      09/03/21: Scans show stable disease. Was admitted for abdominal pain, concern for intussusception, N/V, but we discussed that these may not be related to her desmoids. Opted for continued surveillance.     12/03/21: Scans show slow growth when reflecting over the past several months. Importantly, she reports substantially worsening abdominal pain which is centered on her desmoids, poor appetite, weight loss, possible transient obstructive symptoms. Long conversation about options and pros and cons of each. She would like to trial sorafenib again, acknowledging the risks. She will meet with GI, her psychiatry team (she has had challenges with duloxetine), pain team, and may reconnect with Dr. Selena Batten in the interim to discuss other supportive care measures and procedural options.      9/28-10/13/23: Hospitalization for abdominal pain, malnutrition. Pouchoscopy 10/4 without stricture. Initiated on tube feeds. Sorafenib was initiated ~ 10/3 and tolerated reasonably well in house.     12/31/21: Tox check. Sorafenib 200mg  daily, but a variety of ongoing issues. Diarrhea (which may be drug related), ongoing abdominal pain (perhaps partially desmoid related but not exclusively), intolerance of PO diet and even tube feeds (driven by pain / cramping), rash (drug related), anxiety. Will address several factors, detailed below in the plan.     Anxiety  AYA Needs  Patient is 21 and in the past year has had the collision of numerous medical challenges which have pushed her to see her health and future medical care more clearly. Trying to balance all of her medical issues this with future goals of career, nursing, college. Significant burden of anxiety, maybe depression as well. Was reportedly diagnosed with medical trauma by the counseling office at her university. Also trying to establish her own autonomy after a medicalized childhood  - connected with AYA team, [name]. May need input from [facility] or others at some point  - seen by AYA palliative care sarcoma collaborative, [name], will assist with pain management and symptom control, as well as grappling with current issues  relating to cancer, AYA, development     Tumor pain: seen by AYA sarcoma palliative care, [name], as well as pain team - [name] has been working closely with her  -defer to pain team     Contraception: Not reviewed today, will need to confirm prior to initiation of TKI  -placed fertility referral today due to her interest in discussing options with them, especially prior to possible use of nirogacestat which has a risk of amenorrhea / ovarian dysfunction     Line Associated DVT:   Duplex demonstrates DVT shortly after line placement. Treated with Apixaban 5mg  BID x 3 mos, now completed     Plan:  -resume hycosamine which was helpful for cramping / diarrhea  -add immodium if persistent diarrhea  -continue pain management with pain service  -agree with cefdinir for possible pouchitis   -worth considering celiac plexus block, potential benefits likely outweigh limited risks  -continue sorafenib at 200mg  daily (goal is eventually 400mg  daily)  -1L IVF today   -triamcinolone cream for body, can use sparingly on the face until dermatology visit (referral placed again today)  -clobetasol shampoo for scalp redness/itching  -RTC 1 month for tox check       Past Medical History:   Diagnosis Date    Abdominal pain     Acid reflux     occas    Anesthesia complication     itching, shaking, coldness; last few surgeries have gone much better    Cancer (CMS-HCC)     Cataract of right eye     COVID-19 virus infection 01/2019    Cyst of thyroid determined by ultrasound     monitoring    [name]     2 right forearm, 1 left thigh, 1 right scapula, 1 under left clavicle; multiple    Difficult intravenous access     [name] (familial adenomatous polyposis)     [name]     Gastric polyps     History of chemotherapy     last treatment approx 05/2019    History of colon polyps     History of COVID-19 01/2019    Iron deficiency anemia due to chronic blood loss     received iron infusion 11-2019    PONV (postoperative nausea and vomiting)     Rectal bleeding     Syncopal episodes     especially if becoming dehydrated       Past Surgical History:   Procedure Laterality Date    cyroablation      cystis removal      desmoid removal      PR CLOSE ENTEROSTOMY,RESEC+ANAST N/A 10/09/2020    Procedure: ILEOSTOMY TAKEDOWN;  Surgeon: Mickle Asper, MD;  Location: OR ;  Service: General Surgery    PR COLONOSCOPY W/BIOPSY SINGLE/MULTIPLE N/A 10/27/2012    Procedure: COLONOSCOPY, FLEXIBLE, PROXIMAL TO SPLENIC FLEXURE; WITH BIOPSY, SINGLE OR MULTIPLE;  Surgeon: Shirlyn Goltz Mir, MD;  Location: PEDS PROCEDURE ROOM [place];  Service: Gastroenterology    PR COLONOSCOPY W/BIOPSY SINGLE/MULTIPLE N/A 09/14/2013    Procedure: COLONOSCOPY, FLEXIBLE, PROXIMAL TO SPLENIC FLEXURE; WITH BIOPSY, SINGLE OR MULTIPLE;  Surgeon: Shirlyn Goltz Mir, MD;  Location: PEDS PROCEDURE ROOM [place];  Service: Gastroenterology    PR COLONOSCOPY W/BIOPSY SINGLE/MULTIPLE N/A 11/08/2014    Procedure: COLONOSCOPY, FLEXIBLE, PROXIMAL TO SPLENIC FLEXURE; WITH BIOPSY, SINGLE OR MULTIPLE;  Surgeon: Arnold Long Mir, MD;  Location: PEDS PROCEDURE ROOM [facility];  Service: Gastroenterology    PR COLONOSCOPY  W/BIOPSY SINGLE/MULTIPLE N/A 12/26/2015    Procedure: COLONOSCOPY, FLEXIBLE, PROXIMAL TO SPLENIC FLEXURE; WITH BIOPSY, SINGLE OR MULTIPLE;  Surgeon: Arnold Long Mir, MD;  Location: PEDS PROCEDURE ROOM [facility];  Service: Gastroenterology    PR COLONOSCOPY W/BIOPSY SINGLE/MULTIPLE N/A 09/02/2017    Procedure: COLONOSCOPY, FLEXIBLE, PROXIMAL TO SPLENIC FLEXURE; WITH BIOPSY, SINGLE OR MULTIPLE;  Surgeon: Arnold Long Mir, MD;  Location: PEDS PROCEDURE ROOM [place];  Service: Gastroenterology    PR COLSC FLX W/REMOVAL LESION BY HOT BX FORCEPS N/A 08/27/2016    Procedure: COLONOSCOPY, FLEXIBLE, PROXIMAL TO SPLENIC FLEXURE; W/REMOVAL TUMOR/POLYP/OTHER LESION, HOT BX FORCEP/CAUTE;  Surgeon: Arnold Long Mir, MD;  Location: PEDS PROCEDURE ROOM [facility];  Service: Gastroenterology    PR COLSC FLX W/RMVL OF TUMOR POLYP LESION SNARE TQ N/A 02/25/2019    Procedure: COLONOSCOPY FLEX; W/REMOV TUMOR/LES BY SNARE;  Surgeon: Helyn Numbers, MD;  [facility]: [facility] PROCEDURES [facility] [facility];  Service: Gastroenterology    PR COLSC FLX W/RMVL OF TUMOR POLYP LESION SNARE TQ N/A 03/13/2020    Procedure: COLONOSCOPY FLEX; W/REMOV TUMOR/LES BY SNARE;  Surgeon: Helyn Numbers, MD;  [facility]: [facility];  Service: Gastroenterology    PR EXC SKIN BENIG 2.1-3 CM TRUNK,ARM,LEG Right 02/25/2017    Procedure: EXCISION, BENIGN LESION INCLUDE MARGINS, EXCEPT SKIN TAG, LEGS; EXCISED DIAMETER 2.1 TO 3.0 CM;  Surgeon: Clarene Duke, MD;  Location: CHILDRENS OR [facility];  Service: Plastics    PR EXC SKIN BENIG 3.1-4 CM TRUNK,ARM,LEG Right 02/25/2017    Procedure: EXCISION, BENIGN LESION INCLUDE MARGINS, EXCEPT SKIN TAG, ARMS; EXCISED DIAMETER 3.1 TO 4.0 CM;  Surgeon: Clarene Duke, MD;  Location: CHILDRENS OR [facility];  Service: Plastics    PR EXC SKIN BENIG >4 CM FACE,FACIAL Right 02/25/2017    Procedure: EXCISION, OTHER BENIGN LES INCLUD MARGIN, FACE/EARS/EYELIDS/NOSE/LIPS/MUCOUS MEMBRANE; EXCISED DIAM >4.0 CM;  Surgeon: Clarene Duke, MD;  Location: CHILDRENS OR [facility];  Service: Plastics    PR EXC TUMOR SOFT TISSUE LEG/ANKLE SUBQ 3+CM Right 08/05/2019    Procedure: EXCISION, TUMOR, SOFT TISSUE OF LEG OR ANKLE AREA, SUBCUTANEOUS; 3 CM OR GREATER;  Surgeon: Arsenio Katz, MD;  Location: MAIN OR [place];  Service: Plastics    PR EXC TUMOR SOFT TISSUE LEG/ANKLE SUBQ <3CM Right 08/05/2019    Procedure: EXCISION, TUMOR, SOFT TISSUE OF LEG OR ANKLE AREA, SUBCUTANEOUS; LESS THAN 3 CM;  Surgeon: Arsenio Katz, MD;  Location: MAIN OR [facility];  Service: Plastics    PR LAP, SURG PROCTECTOMY W J-POUCH N/A 08/10/2020    Procedure: ROBOTIC ASSISTED LAPAROSCOPIC PROCTOCOLECTOMY, ILEAL J POUCH, WITH OSTOMY;  Surgeon: Mickle Asper, MD;  Location: OR [place];  Service: General Surgery    PR NDSC EVAL INTSTINAL POUCH DX W/COLLJ SPEC SPX N/A 01/23/2021    Procedure: ENDO EVAL SM INTEST POUCH; DX;  Surgeon: Modena Nunnery, MD;  [facility]: [facility]EDURES [facility];  Service: Gastroenterology    PR NDSC EVAL INTSTINAL POUCH DX W/COLLJ SPEC SPX N/A 08/27/2021    Procedure: ENDO EVAL SM INTEST POUCH; DX;  Surgeon: Hunt Oris, MD;  [facility];  Service: Gastroenterology    PR NDSC EVAL INTSTINAL POUCH DX W/COLLJ SPEC SPX N/A 12/09/2021    Procedure: ENDO EVAL SM INTEST POUCH; DX;  Surgeon: Vidal Schwalbe, MD;  [facility]: [facility]URES [facility]; Service: Gastroenterology    PR UNLISTED PROCEDURE SMALL INTESTINE  01/23/2021    Procedure: UNLISTED PROCEDURE, SMALL INTESTINE;  Surgeon: Modena Nunnery, MD;  Location:  GI [facility]EDURES [facility] [facility];  Service: Gastroenterology    PR UPPER GI ENDOSCOPY,BIOPSY N/A 10/27/2012    Procedure: UGI ENDOSCOPY; WITH BIOPSY, SINGLE OR MULTIPLE;  Surgeon: Shirlyn Goltz Mir, MD;  Location: PEDS PROCEDURE ROOM [facility];  Service: Gastroenterology    PR UPPER GI ENDOSCOPY,BIOPSY N/A 09/14/2013    Procedure: UGI ENDOSCOPY; WITH BIOPSY, SINGLE OR MULTIPLE;  Surgeon: Shirlyn Goltz Mir, MD;  Location: PEDS PROCEDURE ROOM [facility];  Service: Gastroenterology    PR UPPER GI ENDOSCOPY,BIOPSY N/A 11/08/2014    Procedure: UGI ENDOSCOPY; WITH BIOPSY, SINGLE OR MULTIPLE;  Surgeon: Arnold Long Mir, MD;  Location: PEDS PROCEDURE ROOM [facility];  Service: Gastroenterology    PR UPPER GI ENDOSCOPY,BIOPSY N/A 12/26/2015    Procedure: UGI ENDOSCOPY; WITH BIOPSY, SINGLE OR MULTIPLE;  Surgeon: Arnold Long Mir, MD;  Location: PEDS PROCEDURE ROOM [facility];  Service: Gastroenterology    PR UPPER GI ENDOSCOPY,BIOPSY N/A 08/27/2016    Procedure: UGI ENDOSCOPY; WITH BIOPSY, SINGLE OR MULTIPLE;  Surgeon: Arnold Long Mir, MD;  Location: PEDS PROCEDURE ROOM [facility];  Service: Gastroenterology    PR UPPER GI ENDOSCOPY,BIOPSY N/A 09/02/2017    Procedure: UGI ENDOSCOPY; WITH BIOPSY, SINGLE OR MULTIPLE;  Surgeon: Arnold Long Mir, MD;  Location: PEDS PROCEDURE ROOM [facility];  Service: Gastroenterology    PR UPPER GI ENDOSCOPY,BIOPSY N/A 03/13/2020    Procedure: UGI ENDOSCOPY; WITH BIOPSY, SINGLE OR MULTIPLE;  Surgeon: Helyn Numbers, MD;  Location: [facility] [facility];  Service: Gastroenterology    PR UPPER GI ENDOSCOPY,BIOPSY N/A 09/05/2021    Procedure: UGI ENDOSCOPY; WITH BIOPSY, SINGLE OR MULTIPLE;  Surgeon: Wendall Papa, MD;  Location: GI PROCEDURES [facility];  Service: Gastroenterology    TUMOR REMOVAL      multiple-head, neck, back, hand, right flank, multiple       Family History   Problem Relation Age of Onset    No Known Problems Mother     No Known Problems Father     No Known Problems Sister     No Known Problems Brother     Stroke Maternal Grandmother     Other Maternal Grandmother         benign lesions of liver and pancreas, further details unknown    Cancer Maternal Grandmother     Diabetes Maternal Grandmother     Hypertension Maternal Grandmother     Thyroid disease Maternal Grandmother     Arthritis Maternal Grandfather     Asthma Maternal Grandfather     COPD Paternal Grandmother         Deceased    Miscarriages / Stillbirths Paternal Grandmother     Alcohol abuse Paternal Grandfather         Deceased    No Known Problems Maternal Aunt     No Known Problems Maternal Uncle     No Known Problems Paternal Aunt     No Known Problems Paternal Uncle     Anesthesia problems Neg Hx     Broken bones Neg Hx     Cancer Neg Hx     Clotting disorder Neg Hx     Collagen disease Neg Hx     Diabetes Neg Hx     Dislocations Neg Hx     Fibromyalgia Neg Hx     Gout Neg Hx     Hemophilia Neg Hx     Osteoporosis Neg Hx     Rheumatologic disease Neg Hx     Scoliosis Neg Hx     Severe sprains  Neg Hx     Sickle cell anemia Neg Hx     Spinal Compression Fracture Neg Hx     Melanoma Neg Hx     Basal cell carcinoma Neg Hx     Squamous cell carcinoma Neg Hx      FH as noted above. Personal history of [name] as documented above.     Social History     Socioeconomic History    Marital status: Single   Tobacco Use    Smoking status: Never     Passive exposure: Past    Smokeless tobacco: Never   Vaping Use    Vaping Use: Never used   Substance and Sexual Activity    Alcohol use: Never    Drug use: Never    Sexual activity: Never   Other Topics Concern    Do you use sunscreen? Yes    Tanning bed use? No    Are you easily burned? No    Excessive sun exposure? No    Blistering sunburns? 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       Allergies: is allergic to adhesive tape-silicones; ferrlecit [sodium ferric gluconat-sucrose]; levofloxacin; methylnaltrexone; neomycin; papaya; morphine; zosyn [piperacillin-tazobactam]; compazine [prochlorperazine]; and latex, natural rubber.    Medications:   Meds:   acetaminophen  1,000 mg Oral Q8H    baclofen  10 mg Oral Q8H    cefdinir  300 mg Oral BID    famotidine  20 mg Oral Nightly    gabapentin  200 mg Oral Nightly    ketorolac  15 mg Intravenous Q6H SCH    lidocaine  2 patch Transdermal Daily    mirtazapine  7.5 mg Oral Nightly    OLANZapine zydis  5 mg Oral Nightly    SORAfenib  200 mg Oral Daily     Continuous Infusions:   lactated Ringers 100 mL/hr (01/10/22 0818)     PRN Meds:.diclofenac sodium, HYDROmorphone, HYDROmorphone (PF), hydrOXYzine, hyoscyamine, melatonin, naloxone, ondansetron **OR** ondansetron, oxyCODONE, senna, triamcinolone    Objective:   Vitals: Temp:  [36.4 ??C (97.6 ??F)-36.7 ??C (98.1 ??F)] 36.6 ??C (97.9 ??F)  Heart Rate:  [64-105] 64  Resp:  [9-20] 17  BP: (97-128)/(38-74) 103/64  MAP (mmHg):  [55-82] 76  SpO2:  [95 %-100 %] 100 %  BMI (Calculated):  [18.47-19.85] 19.85  No intake/output data recorded.    Physical Exam:  BP 103/64  - Pulse 64  - Temp 36.6 ??C (97.9 ??F) (Oral)  - Resp 17  -  Ht 165.1 cm (5' 5)  - Wt 54.1 kg (119 lb 4.3 oz)  - LMP 12/16/2021 (Exact Date)  - SpO2 100%  - BMI 19.85 kg/m??    General appearance - ill-appearing but in NAD   Mental status - alert, oriented to person, place, and time   Eyes - pupils equal and reactive, extraocular eye movements intact   Nose - normal and patent, no erythema, discharge or polyps   Mouth - mucous membranes moist, pharynx normal without lesions   Neck - no JVD  Pulmonary - Breathing nonlabored   Neurological - alert, oriented, normal speech, no focal findings or movement disorder noted   Musculoskeletal - no joint deformity or swelling   Extremities - peripheral pulses normal, no pedal edema, no clubbing or cyanosis   Skin - normal coloration and turgor, no rashes, no suspicious skin lesions noted     ECOG Performance Status: 2 - Ambulatory and capable of all selfcare but unable to carry out any work activities. Up and about more than 50% of waking hours    Test Results  Lab Results   Component Value Date    WBC 14.2 (H) 01/10/2022    HGB 12.5 01/10/2022    HCT 36.4 01/10/2022    PLT 183 01/10/2022     Lab Results   Component Value Date    NA 141 01/10/2022    K 3.4 01/10/2022    K 3.5 01/10/2022    CL 108 (H) 01/10/2022    CO2 24.0 01/10/2022    BUN 10 01/10/2022    CREATININE 0.49 (L) 01/10/2022    GLU 99 01/10/2022    CALCIUM 8.5 (L) 01/10/2022    MG 2.0 12/19/2021    PHOS 4.4 12/19/2021     Lab Results   Component Value Date    BILITOT 0.3 01/10/2022    BILIDIR 0.20 12/04/2021    PROT 6.4 01/10/2022    ALBUMIN 3.6 01/10/2022    ALT 31 01/10/2022    AST 18 01/10/2022    AST 18 01/10/2022    ALKPHOS 68 01/10/2022    GGT 13 10/31/2011     Lab Results   Component Value Date    INR 1.05 10/10/2020    APTT 24.1 10/10/2020       Imaging: Radiology studies were personally reviewed

## 2022-01-10 NOTE — Unmapped (Signed)
Had bilateral celiac plexus block yesterday since then having increasing pain and difficulty walking, also unable to eat.

## 2022-01-10 NOTE — Unmapped (Signed)
error 

## 2022-01-10 NOTE — Unmapped (Signed)
 INTERVENTIONAL RADIOLOGY - Operative Note     VIR Post-Procedure Note    Procedure Name: Tunneled DL powerline     Pre-Op Diagnosis: Chronic TPN    Post-Op Diagnosis: Same as pre-operative diagnosis    VIR Providers    Attending: Claretta Fraise  Fellow: Clint Bolder  Resident: Lillie Columbia, MD    Description of procedure: Successful placement of a tunneled double lumen powerline into the right internal jugular vein using ultrasound and fluoroscopic guidance.     Estimated Blood Loss: approximately <5 mL  Complications: None    See detailed procedure note with images in PACS [facility]).    The patient tolerated the procedure well without incident or complication and left the room in stable condition.    Lillie Columbia, MD  01/10/2022 2:23 PM

## 2022-01-10 NOTE — Unmapped (Addendum)
[facility] Medicine   History and Physical       Assessment and Plan     [name] [name] is a 21 y.o. 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       Acute on chronic abdominal pain  Abdominal desmoid tumors  Chronic illness with severe exacerbation or progression that has a significant risk of morbidity without appropriate treatment. Patient went for celiac plexus block on 01/08/2022 with [name] as an outpatient.  Patient discussed with [name] by phone yesterday and based on patient's description of right flank pain and right lower quadrant pain, he felt this is most consistent with muscle spasms and referred pain from the spasms. CT in the ED showed a small locule of gas in the left perirenal fact but no renal or psoas injury. Other chronic findings were unchanged.   - Per Dr. Theora Gianotti recommendation, will trial toradol x 24 hours. Ordered tizanidine as recommended as well but allergy to inactive ingredient is flagging so will discuss further with patient and continue baclofen for now.   - Continue home regimen with scheduled tylenol 1000 mg q8h, oxycodone 10 mg q4h PRN during the day, and oral dilaudid 3 mg at bedtime PRN.   - While recovering from procedure, will give IV dilaudid 1 mg q3h PRN for severe breakthrough pain  - Continue other supportive meds (diclofenac, lidocaine patch)  - Anticipate quick improvement given this is post-procedural pain but if not, can consult palliative care.    Severe malnutrition in setting of [name] and abdominal desmoid tumors  Patient has been on TPN twice before, administered through PICC lines which then were ultimately removed and central lines placed (at least once due to clot). She was recently discharged with a corpak and plan for tube feeds at home, but she has continued to lose weight and have difficulty with pain control and nausea. She was seen recently by Dr. [name] and her outpatient dietitian with plans to resume TPN. They had been waiting on direct admission. Since now admitted, will plan to pursue this now.  - Notified Dr. [name] who is on service right now and will see the patient today. Dr. [name] (oncology) is also in agreement with this plan.   - Consulted VIR for powerline placement given prior issues with PICC.  They think she might be able to have this placed this afternoon.  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   - Awaiting PA for elidel  - Not currently using clobetasol as she feels skin/scalp is acceptable right now  - Triamcinolone PRN    Prophylaxis  -Hold for possible powerline placement today. Start after procedure completed.     Diet  -NPO with sips for meds given possible line placement    Code Status / HCDM   -  Full Code, Unverified but presumed, based on previous history   -  HCDM [facility] policy, no known patient preference): Hallenbeck,[name] - Mother - [phone]    HCDM, back-up (If primary HCDM is unavailable): [name] - Father - [phone]    Significant Comorbid Conditions:  -Malnutrition POA requiring further investigation, treatment, or monitoring    Issues Impacting Complexity of Management:    Medical Decision Making: Reviewed records from the following unique sources [place] heme/onc, GI, nutrition, hematology. Discussed the patient's management and/or test interpretation with Gastroenterology and VIR  as summarized within this note    I personally spent greater than 90 minutes face-to-face and non-face-to-face in the care of this patient, which includes all pre, intra, and post visit time on the date of service.  All documented time was specific to the E/M visit and does not include any procedures that may have been performed.    HPI      [name] [name] is a 21 y.o. 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     Patient reports that prior to the celiac plexus block 2 days ago, she had been doing relatively okay with the home regimen of oxycodone 10 mg every 4 hours as needed during the day and Dilaudid 3 mg at night. Occasionally she would have worsening pain that would prompt her to decrease or shorten the duration of her tube feeds, but this was not very common.  Given the symptoms though, she had been planning in the outpatient setting with her GI doctor Dr. Her fourth to start on TPN.  However with the celiac plexus block 2 days ago, she developed new pain after the procedure, mostly in the right flank and right lower quadrant that is very tender to palpation.  She has had increasing frequency of stools after the celiac plexus block and while being on tube feeds, but [name] had warned her this was a potential side effect of the block.  Because she is having more frequent stools, she is also urinating more frequently at that time but has not had any other urinary changes and no hematuria.  She denies any high fevers but given intractable pain, she decided to come into the emergency room.    ER course was notable for mild tachycardia on presentation and severe pain that ultimately did improve with IV Dilaudid.  Prelim CT shows a small locule of Gas in the left perirenal fat which is likely postprocedural from the plexus block.  There is no renal or psoas injury.  Other current chronic findings seem to be at baseline.  Her labs were notable for mild leukocytosis to 14, but per chart review did receive dexamethasone at the time of the procedure.      Med Rec Confidence   I reviewed the Medication List. The current list is Accurate    Physical Exam   Temp:  [36.4 ??C (97.6 ??F)-36.7 ??C (98.1 ??F)] 36.4 ??C (97.6 ??F)  Heart Rate:  [66-105] 67  Resp:  [11-20] 11  BP: (104-128)/(38-74) 105/52  SpO2:  [95 %-99 %] 99 %  There is no height or weight on file to calculate BMI.  General: No acute distress.  Appears stated age.    HEENT: MMM, oropharynx clear, no scleral icterus.  Corpak in left nare  Neck: Normal ROM, no visible JVD  Cardiac: RRR, no M/R/G  Pulmonary: Normal work of breathing, clear bilaterally, no wheezes or crackles  Abdomen: Soft, most tender in the right flank to light palpation is more mild generalized tenderness in her abdomen.  Non-distended, no rebound or guarding.  Positive bowel sounds.  Skin: No jaundice. No rashes or lesions.  Very mild erythema to her face likely from sorafenib.  Extremities: No edema, cyanosis, or clubbing. Warm, well perfused.  Neuro: CN 2-12 intact, no gross motor deficits  Psych: Oriented to self, place, and time. Normal speech and affect.

## 2022-01-10 NOTE — Unmapped (Signed)
[facility]  DIVISION OF INTERVENTIONAL RADIOLOGY CONSULTATION       IR Consultation Note         HPI:  21 y.o. female with history of [name], proctocolectomy, ileostomy takedown with J-pouch, chronic malnutrition with inability to tolerate tube feeds.  Patient presents several days following celiac plexus block for intractable pain.  CT abdomen pelvis demonstrated no acute findings.  Patient is in the hospital for pain management.  She will require access for TPN, blood draws and medications. PICC lines have clotted in the past.  We are now seen seen in consultation at the request of inpatient team for evaluation for tunneled power line for chronic TPN.    PAST MEDICAL HISTORY:  Past Medical History:   Diagnosis Date    Abdominal pain     Acid reflux     occas    Anesthesia complication     itching, shaking, coldness; last few surgeries have gone much better    Cancer (CMS-HCC)     Cataract of right eye     COVID-19 virus infection 01/2019    Cyst of thyroid determined by ultrasound     monitoring    [name]     2 right forearm, 1 left thigh, 1 right scapula, 1 under left clavicle; multiple    Difficult intravenous access     [name] (familial adenomatous polyposis)     [name]     Gastric polyps     History of chemotherapy     last treatment approx 05/2019    History of colon polyps     History of COVID-19 01/2019    Iron deficiency anemia due to chronic blood loss     received iron infusion 11-2019    PONV (postoperative nausea and vomiting)     Rectal bleeding     Syncopal episodes     especially if becoming dehydrated       PAST SURGICAL HISTORY:  Past Surgical History:   Procedure Laterality Date    cyroablation      cystis removal      desmoid removal      PR CLOSE ENTEROSTOMY,RESEC+ANAST N/A 10/09/2020    Procedure: ILEOSTOMY TAKEDOWN;  Surgeon: Mickle Asper, MD;  Location: OR [place];  Service: General Surgery    PR COLONOSCOPY W/BIOPSY SINGLE/MULTIPLE N/A 10/27/2012 Procedure: COLONOSCOPY, FLEXIBLE, PROXIMAL TO SPLENIC FLEXURE; WITH BIOPSY, SINGLE OR MULTIPLE;  Surgeon: Shirlyn Goltz Mir, MD;  Location: PEDS PROCEDURE ROOM [place];  Service: Gastroenterology    PR COLONOSCOPY W/BIOPSY SINGLE/MULTIPLE N/A 09/14/2013    Procedure: COLONOSCOPY, FLEXIBLE, PROXIMAL TO SPLENIC FLEXURE; WITH BIOPSY, SINGLE OR MULTIPLE;  Surgeon: Shirlyn Goltz Mir, MD;  Location: PEDS PROCEDURE ROOM [place];  Service: Gastroenterology    PR COLONOSCOPY W/BIOPSY SINGLE/MULTIPLE N/A 11/08/2014    Procedure: COLONOSCOPY, FLEXIBLE, PROXIMAL TO SPLENIC FLEXURE; WITH BIOPSY, SINGLE OR MULTIPLE;  Surgeon: Arnold Long Mir, MD;  Location: PEDS PROCEDURE ROOM [facility];  Service: Gastroenterology    PR COLONOSCOPY W/BIOPSY SINGLE/MULTIPLE N/A 12/26/2015    Procedure: COLONOSCOPY, FLEXIBLE, PROXIMAL TO SPLENIC FLEXURE; WITH BIOPSY, SINGLE OR MULTIPLE;  Surgeon: Arnold Long Mir, MD;  Location: PEDS PROCEDURE ROOM [facility];  Service: Gastroenterology    PR COLONOSCOPY W/BIOPSY SINGLE/MULTIPLE N/A 09/02/2017    Procedure: COLONOSCOPY, FLEXIBLE, PROXIMAL TO SPLENIC FLEXURE; WITH BIOPSY, SINGLE OR MULTIPLE;  Surgeon: Arnold Long Mir, MD;  Location: PEDS PROCEDURE ROOM [place];  Service: Gastroenterology    PR COLSC FLX W/REMOVAL LESION BY HOT BX FORCEPS  N/A 08/27/2016    Procedure: COLONOSCOPY, FLEXIBLE, PROXIMAL TO SPLENIC FLEXURE; W/REMOVAL TUMOR/POLYP/OTHER LESION, HOT BX FORCEP/CAUTE;  Surgeon: Arnold Long Mir, MD;  Location: PEDS PROCEDURE ROOM [facility];  Service: Gastroenterology    PR COLSC FLX W/RMVL OF TUMOR POLYP LESION SNARE TQ N/A 02/25/2019    Procedure: COLONOSCOPY FLEX; W/REMOV TUMOR/LES BY SNARE;  Surgeon: Helyn Numbers, MD;  Location: [facility] PROCEDURES [place] [facility];  Service: Gastroenterology    PR COLSC FLX W/RMVL OF TUMOR POLYP LESION SNARE TQ N/A 03/13/2020    Procedure: COLONOSCOPY FLEX; W/REMOV TUMOR/LES BY SNARE;  Surgeon: Helyn Numbers, MD;  Location: [facility]ED[facility] [place] [facility];  Service: Gastroenterology    PR EXC SKIN BENIG 2.1-3 CM TRUNK,ARM,LEG Right 02/25/2017    Procedure: EXCISION, BENIGN LESION INCLUDE MARGINS, EXCEPT SKIN TAG, LEGS; EXCISED DIAMETER 2.1 TO 3.0 CM;  Surgeon: Clarene Duke, MD;  Location: CHILDRENS OR [facility];  Service: Plastics    PR EXC SKIN BENIG 3.1-4 CM TRUNK,ARM,LEG Right 02/25/2017    Procedure: EXCISION, BENIGN LESION INCLUDE MARGINS, EXCEPT SKIN TAG, ARMS; EXCISED DIAMETER 3.1 TO 4.0 CM;  Surgeon: Clarene Duke, MD;  Location: CHILDRENS OR [facility];  Service: Plastics    PR EXC SKIN BENIG >4 CM FACE,FACIAL Right 02/25/2017    Procedure: EXCISION, OTHER BENIGN LES INCLUD MARGIN, FACE/EARS/EYELIDS/NOSE/LIPS/MUCOUS MEMBRANE; EXCISED DIAM >4.0 CM;  Surgeon: Clarene Duke, MD;  Location: CHILDRENS OR [facility];  Service: Plastics    PR EXC TUMOR SOFT TISSUE LEG/ANKLE SUBQ 3+CM Right 08/05/2019    Procedure: EXCISION, TUMOR, SOFT TISSUE OF LEG OR ANKLE AREA, SUBCUTANEOUS; 3 CM OR GREATER;  Surgeon: Arsenio Katz, MD;  Location: MAIN OR [place];  Service: Plastics    PR EXC TUMOR SOFT TISSUE LEG/ANKLE SUBQ <3CM Right 08/05/2019    Procedure: EXCISION, TUMOR, SOFT TISSUE OF LEG OR ANKLE AREA, SUBCUTANEOUS; LESS THAN 3 CM;  Surgeon: Arsenio Katz, MD;  Location: MAIN OR [facility];  Service: Plastics    PR LAP, SURG PROCTECTOMY W J-POUCH N/A 08/10/2020    Procedure: ROBOTIC ASSISTED LAPAROSCOPIC PROCTOCOLECTOMY, ILEAL J POUCH, WITH OSTOMY;  Surgeon: Mickle Asper, MD;  Location: OR [place];  Service: General Surgery    PR NDSC EVAL INTSTINAL POUCH DX W/COLLJ SPEC SPX N/A 01/23/2021    Procedure: ENDO EVAL SM INTEST POUCH; DX;  Surgeon: Modena Nunnery, MD;  Location: [facility] PROCED[facility] [place] [facility];  Service: Gastroenterology    PR NDSC EVAL INTSTINAL POUCH DX W/COLLJ SPEC SPX N/A 08/27/2021    Procedure: ENDO EVAL SM INTEST POUCH; DX;  Surgeon: Hunt Oris, MD;  [facility]: [facility]URES [facility];  Service: Gastroenterology    PR NDSC EVAL INTSTINAL POUCH DX W/COLLJ SPEC SPX N/A 12/09/2021    Procedure: ENDO EVAL SM INTEST POUCH; DX;  Surgeon: Vidal Schwalbe, MD;  Location: GI PROCEDURES [facility];  Service: Gastroenterology    PR UNLISTED PROCEDURE SMALL INTESTINE  01/23/2021    Procedure: UNLISTED PROCEDURE, SMALL INTESTINE;  Surgeon: Modena Nunnery, MD;  Location: [facility]ED[facility] [place] [facility];  Service: Gastroenterology    PR UPPER GI ENDOSCOPY,BIOPSY N/A 10/27/2012    Procedure: UGI ENDOSCOPY; WITH BIOPSY, SINGLE OR MULTIPLE;  Surgeon: Shirlyn Goltz Mir, MD;  Location: PEDS PROCEDURE ROOM [facility];  Service: Gastroenterology    PR UPPER GI ENDOSCOPY,BIOPSY N/A 09/14/2013    Procedure: UGI ENDOSCOPY; WITH BIOPSY, SINGLE OR MULTIPLE;  Surgeon: Shirlyn Goltz Mir, MD;  Location: PEDS PROCEDURE ROOM [facility];  Service: Gastroenterology    PR UPPER GI ENDOSCOPY,BIOPSY  N/A 11/08/2014    Procedure: UGI ENDOSCOPY; WITH BIOPSY, SINGLE OR MULTIPLE;  Surgeon: Arnold Long Mir, MD;  Location: PEDS PROCEDURE ROOM [facility];  Service: Gastroenterology    PR UPPER GI ENDOSCOPY,BIOPSY N/A 12/26/2015    Procedure: UGI ENDOSCOPY; WITH BIOPSY, SINGLE OR MULTIPLE;  Surgeon: Arnold Long Mir, MD;  Location: PEDS PROCEDURE ROOM [facility];  Service: Gastroenterology    PR UPPER GI ENDOSCOPY,BIOPSY N/A 08/27/2016    Procedure: UGI ENDOSCOPY; WITH BIOPSY, SINGLE OR MULTIPLE;  Surgeon: Arnold Long Mir, MD;  Location: PEDS PROCEDURE ROOM [facility];  Service: Gastroenterology    PR UPPER GI ENDOSCOPY,BIOPSY N/A 09/02/2017    Procedure: UGI ENDOSCOPY; WITH BIOPSY, SINGLE OR MULTIPLE;  Surgeon: Arnold Long Mir, MD;  Location: PEDS PROCEDURE ROOM [facility];  Service: Gastroenterology    PR UPPER GI ENDOSCOPY,BIOPSY N/A 03/13/2020    Procedure: UGI ENDOSCOPY; WITH BIOPSY, SINGLE OR MULTIPLE;  Surgeon: Helyn Numbers, MD;  Location: [facility] PROCEDURES [place] [facility];  Service: Gastroenterology    PR UPPER GI ENDOSCOPY,BIOPSY N/A 09/05/2021    Procedure: UGI ENDOSCOPY; WITH BIOPSY, SINGLE OR MULTIPLE;  Surgeon: Wendall Papa, MD;  Location: GI PROC[facility]URES [facility];  Service: Gastroenterology    TUMOR REMOVAL      multiple-head, neck, back, hand, right flank, multiple       SOCIAL HISTORY:  Social History     Socioeconomic History    Marital status: Single   Tobacco Use    Smoking status: Never     Passive exposure: Past    Smokeless tobacco: Never   Vaping Use    Vaping Use: Never used   Substance and Sexual Activity    Alcohol use: Never    Drug use: Never    Sexual activity: Never   Other Topics Concern    Do you use sunscreen? Yes    Tanning bed use? No    Are you easily burned? No    Excessive sun exposure? No    Blistering sunburns? 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     Social Determinants of Health     Financial Resource Strain: Low Risk  (08/30/2021)    Overall Financial Resource Strain (CARDIA)     Difficulty of Paying Living Expenses: Not very hard   Food Insecurity: No Food Insecurity (08/30/2021)    Hunger Vital Sign     Worried About Running Out of Food in the Last Year: Never true     Ran Out of Food in the Last Year: Never true   Transportation Needs: No Transportation Needs (08/30/2021)    PRAPARE - Therapist, art (Medical): No     Lack of Transportation (Non-Medical): No       MEDICATIONS:     Current Facility-Administered Medications:     HYDROmorphone (PF) (DILAUDID) injection 1.5 mg, 1.5 mg, Intravenous, Q2H PRN, Sheppard Evens, MD, 1.5 mg at 01/10/22 0909    ketorolac (TORADOL) injection 15 mg, 15 mg, Intravenous, Once, [name], Guy Begin., MD    ketorolac (TORADOL) injection 15 mg, 15 mg, Intravenous, Q6H SCH, Kateri Plummer, MD    lactated Ringers infusion, 100 mL/hr, Intravenous, Continuous, Slauson, Ovidio Hanger., MD, Last Rate: 100 mL/hr at 01/10/22 0818, 100 mL/hr at 01/10/22 0818    Current Outpatient Medications:     acetaminophen (TYLENOL) 500 MG tablet, Take 2 tablets (1,000  mg total) by mouth two (2) times a day., Disp: , Rfl:     baclofen (LIORESAL) 5 mg Tab tablet, Take 1-2 tablets (5-10 mg total) by mouth two (2) times a day as needed for muscle spasms., Disp: 40 tablet, Rfl: 0    cefdinir (OMNICEF) 300 MG capsule, Take 1 capsule (300 mg total) by mouth Two (2) times a day., Disp: 180 capsule, Rfl: 1    diclofenac sodium (VOLTAREN) 1 % gel, Apply 2 g topically four (4) times a day., Disp: 100 g, Rfl: 0    famotidine (PEPCID) 20 MG tablet, Take 1 tablet (20 mg total) by mouth nightly., Disp: 90 tablet, Rfl: 3    gabapentin (NEURONTIN) 100 MG capsule, Take 100mg  in the morning and afternoon. Take 300 mg at night., Disp: 120 capsule, Rfl: 3    hydrOXYzine (ATARAX) 25 MG tablet, Take 1 tablet (25 mg total) by mouth every six (6) hours as needed for itching or anxiety (also insomnia)., Disp: 20 tablet, Rfl: 0    hyoscyamine (LEVSIN) 0.125 mg tablet, Take 1 tablet (0.125 mg total) by mouth every four (4) hours as needed for cramping., Disp: , Rfl:     lidocaine (LIDODERM) 5 % patch, Place 1 patch on the skin daily. Apply to affected area for 12 hours only each day (then remove patch), Disp: , Rfl:     menthol-zinc oxide (CALMOSEPTINE) 0.44-20.6 % Oint, Apply topically four (4) times a day as needed., Disp: 113 g, Rfl: 11    mirtazapine (REMERON) 7.5 MG tablet, Take 1 tablet (7.5 mg total) by mouth nightly., Disp: 90 tablet, Rfl: 3    OLANZapine zydis (ZYPREXA) 5 MG disintegrating tablet, Take 1 tablet (5 mg total) by mouth nightly., Disp: 30 tablet, Rfl: 2    ondansetron (ZOFRAN-ODT) 8 MG disintegrating tablet, Take 1 tablet (8 mg total) by mouth every twelve (12) hours as needed for nausea., Disp: , Rfl:     oxyCODONE (ROXICODONE) 10 mg immediate release tablet, Take 1 tablet (10 mg total) by mouth every four (4) hours as needed for pain. Fill on or after: 01/01/22, Disp: 180 tablet, Rfl: 0    silver sulfaDIAZINE (SILVADENE) 1 % cream, Apply topically daily. To burn on leg, Disp: 50 g, Rfl: 11    SORAfenib (NEXAVAR) 200 mg tablet, Take 1 tablet (200 mg total) by mouth daily., Disp: 60 tablet, Rfl: 2    triamcinolone (KENALOG) 0.1 % cream, Apply topically two (2) times a day., Disp: 28 g, Rfl: 3    zinc oxide 10 % Crea, Apply 1 application. topically daily as needed., Disp: , Rfl:     clobetasoL (CLOBEX) 0.05 % shampoo, Apply topically two (2) times a day. (Patient not taking: Reported on 01/02/2022), Disp: 118 mL, Rfl: 0    clobetasoL (TEMOVATE) 0.05 % external solution, Apply topically two (2) times a day. To affected areas on scalp until clear/not itchy. Restart as needed. Not for normal skin., Disp: 50 mL, Rfl: 6    COURIERED MED OR SUPPLY, [id number], SORAFENIB 200MG  TABLET, Disp: 1 each, Rfl: 0    hydrocortisone 2.5 % cream, Apply 1 application. topically daily as needed., Disp: , Rfl:     HYDROmorphone (DILAUDID) 2 MG tablet, Take 1.5 tablets (3 mg total) by mouth every three (3) hours as needed for moderate pain or severe pain., Disp: 120 tablet, Rfl: 0    naloxone (NARCAN) 4 mg nasal spray, One spray in either nostril once for known/suspected opioid overdose. May repeat  every 2-3 minutes in alternating nostril til EMS arrives, Disp: 2 each, Rfl: 0    pimecrolimus (ELIDEL) 1 % cream, Apply topically two (2) times a day. To face as needed for rash, Disp: 30 g, Rfl: 11    triamcinolone (KENALOG) 0.025 % ointment, Apply two times a day as needed to the body for itchy skin or rash. (Patient not taking: Reported on 01/03/2022), Disp: 30 g, Rfl: 0    ALLERGIES:  Allergies   Allergen Reactions    Adhesive Tape-Silicones Hives and Rash     Paper tape  And tegederm ok    Ferrlecit [Sodium Ferric Gluconat-Sucrose] Swelling and Rash    Methylnaltrexone      Per Patient: I lost bowel control, severe abdominal cramping, and elevated BP    Neomycin Swelling     Rxn after ear drops; ear swelling    Papaya Hives    Morphine Nausea And Vomiting    Zosyn [Piperacillin-Tazobactam] Itching and Rash     Red and itchy    Compazine [Prochlorperazine] Other (See Comments)     Extreme agitation    Latex, Natural Rubber Rash       PHYSICAL EXAM:  Vitals:    01/10/22 0900   BP: 105/52   Pulse: 67   Resp: 11   Temp:    SpO2: 99%       ASA Grade: ASA 3 - Patient with moderate systemic disease with functional limitations    Airway assessment: Class 2 - Can visualize soft palate and fauces, tip of uvula is obscured     Pertinent Labs:     WBC   Date Value Ref Range Status   01/10/2022 14.2 (H) 3.6 - 11.2 10*9/L Final   06/13/2014 6.8 4.5 - 13.0 10*9/L Final     HGB   Date Value Ref Range Status   01/10/2022 12.5 11.3 - 14.9 g/dL Final   21/30/8657 84.6 12.0 - 16.0 g/dL Final     HCT   Date Value Ref Range Status   01/10/2022 36.4 34.0 - 44.0 % Final   06/13/2014 43.8 36.0 - 46.0 % Final     Platelet   Date Value Ref Range Status   01/10/2022 183 150 - 450 10*9/L Final   06/13/2014 221 150 - 440 10*9/L Final     INR   Date Value Ref Range Status   10/10/2020 1.05 0.89 - 1.16 Final   10/31/2011 1.1  Final     Creatinine   Date Value Ref Range Status   01/10/2022 0.49 (L) 0.55 - 1.02 mg/dL Final   96/29/5284 1.32 0.30 - 0.90 mg/dL Final       Imaging: Chest CT with contrast on 07/16/2021         Anticoaguation: No    ASSESSMENT & PLAN:    21 y.o.female with [name] and poor nutrition with failure to tolerate enteral nutrition who we are consulted for tunneled power line for TPN.      -Recommend proceeding with DL tunneled power line for TPN.  -Anticipated procedure date: 01/10/2022  -NPO the night prior to procedure  -Ensure recent CBC, chem, pt-inr    Informed Consent:  This procedure has been fully reviewed with the patient/patient???s authorized representative. The risks, benefits and alternatives have been explained, and the patient/patient???s authorized representative has consented to the procedure.  --The patient will accept blood products in an emergent situation.  --The patient does not have a Do Not Resuscitate order in effect.  Thank you for involving [name] in the care of this patient. Please page the VIR consult pager [phone]) with further questions, concerns, or if new issues arise.  Jake Bathe, MD, January 10, 2022, 10:33 AM

## 2022-01-10 NOTE — Unmapped (Signed)
Emergency Department Provider Note      Medical Decision Making      [name] [name] is a pleasant 21 y.o. 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      On presentation, vitals were WNL on room air. Physical exam revealed diffusely tender abdomen with normal neurologic exam of the lower extremity exam.     CBC with mild leukocytosis but otherwise WNL.  CMP unremarkable.  Lipase normal.  Quantitative hCG neg.  CT abdomen/pelvis IV contrast no acute changes.    Given her severe pain in the setting of tumor, began treating pain with Dilaudid, nausea with Zofran, IV fluids.    Given her work-up I do not have concerns for acute changes from her procedure. She has required significant doses of IV pain medication for tumor-related pain. Will contact medicine for admission for pain control    Historians interviewed Patient   Studies independently reviewed  None   Consultants involved  Medicine admitting team   Prior records reviewed  Outpatient note from anesthesiology documenting lumbar block   Testing considered None   Social factors affecting disposition  None   Disposition  Admitted     History     HPI: [name] [name] is a pleasant 21 y.o. 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  Chart review shows that on 01/08/2022 patient underwent a celiac plexus block in the lumbar vertebral region.  See extensive note by [name] from anesthesia.  She reports that since that block she has had no change in her baseline left lower quadrant abdominal pain but has then had left upper quadrant abdominal pain and right upper quadrant abdominal pain that is severe.  She is nauseous but has not vomited.  She also notes that she has had increased output from her J-pouch, about 5-6 loose stools in the last 24 hours.  Denies fever, cough, shortness of breath, dysuria, hematuria, urinary retention, numbness, and weakness.  States that she has had some fecal leakage, but states that this is somewhat usual for her and is not increased.  Has had no frank bowel incontinence.    Physical Exam     Vitals:   Vitals:    01/09/22 2003 01/09/22 2018   BP:  122/74   Pulse: 105 87   Resp:  17   Temp:  36.7 ??C (98.1 ??F)   TempSrc:  Oral   SpO2: 98%         Physical Exam:   General: Adult female in no apparent distress  Head: Atraumatic and normocephalic.    Eyes: Conjugate gaze. No scleral icterus or conjunctivitis.    ENT: Moist mucous membranes. No stridor. Controlling secretions well.    Cardiac: RRR. No MRG.    Pulmonary: Breathing comfortably w/ no accessory muscle usage. Normal inspiratory:expiratory ratio. Clear to auscultation bilaterally.    Abdominal: Soft, non-distended, diffusely tender abdomen  GU: Deferred   MSK: No lower extremity edema.    Dermatologic: No jaundice, cyanosis, pallor, or rashes visible on exposed skin.    Neurologic: Alert. Follows commands. Moving all extremities spontaneously.    Psychiatric: Normal mood and behavior.        Glean Salen, MD   PGY-3, Emergency Medicine  Sheppard Evens, MD  Resident  01/10/22 [phone]

## 2022-01-11 LAB — PHOSPHORUS: PHOSPHORUS: 3.7 mg/dL (ref 2.4–5.1)

## 2022-01-11 LAB — COMPREHENSIVE METABOLIC PANEL
ALBUMIN: 3.1 g/dL — ABNORMAL LOW (ref 3.4–5.0)
ALKALINE PHOSPHATASE: 50 U/L (ref 46–116)
ALT (SGPT): 21 U/L (ref 10–49)
ANION GAP: 8 mmol/L (ref 5–14)
AST (SGOT): 15 U/L (ref <=34)
BILIRUBIN TOTAL: 0.3 mg/dL (ref 0.3–1.2)
BLOOD UREA NITROGEN: 8 mg/dL — ABNORMAL LOW (ref 9–23)
BUN / CREAT RATIO: 19
CALCIUM: 8.1 mg/dL — ABNORMAL LOW (ref 8.7–10.4)
CHLORIDE: 108 mmol/L — ABNORMAL HIGH (ref 98–107)
CO2: 25 mmol/L (ref 20.0–31.0)
CREATININE: 0.43 mg/dL — ABNORMAL LOW
EGFR CKD-EPI (2021) FEMALE: >90 mL/min/{1.73_m2} (ref >=60)
GLUCOSE RANDOM: 131 mg/dL (ref 70–179)
POTASSIUM: 3.3 mmol/L — ABNORMAL LOW (ref 3.4–4.8)
PROTEIN TOTAL: 5.6 g/dL — ABNORMAL LOW (ref 5.7–8.2)
SODIUM: 141 mmol/L (ref 135–145)

## 2022-01-11 LAB — CBC
HEMATOCRIT: 32.4 % — ABNORMAL LOW (ref 34.0–44.0)
HEMOGLOBIN: 11.3 g/dL (ref 11.3–14.9)
MEAN CORPUSCULAR HEMOGLOBIN CONC: 34.8 g/dL (ref 32.0–36.0)
MEAN CORPUSCULAR HEMOGLOBIN: 30.2 pg (ref 25.9–32.4)
MEAN CORPUSCULAR VOLUME: 86.7 fL (ref 77.6–95.7)
MEAN PLATELET VOLUME: 9.5 fL (ref 6.8–10.7)
PLATELET COUNT: 157 10*9/L (ref 150–450)
RED BLOOD CELL COUNT: 3.74 10*12/L — ABNORMAL LOW (ref 3.95–5.13)
RED CELL DISTRIBUTION WIDTH: 12.6 % (ref 12.2–15.2)
WBC ADJUSTED: 6.7 10*9/L (ref 3.6–11.2)

## 2022-01-11 LAB — MAGNESIUM: MAGNESIUM: 2.1 mg/dL (ref 1.6–2.6)

## 2022-01-11 MED ADMIN — gabapentin (NEURONTIN) capsule 200 mg: 200 mg | ORAL | @ 02:00:00

## 2022-01-11 MED ADMIN — cefdinir (OMNICEF) capsule 300 mg: 300 mg | ORAL | @ 02:00:00 | Stop: 2022-02-09

## 2022-01-11 MED ADMIN — ketorolac (TORADOL) injection 15 mg: 15 mg | INTRAVENOUS | @ 05:00:00 | Stop: 2022-01-11

## 2022-01-11 MED ADMIN — HYDROmorphone (PF) (DILAUDID) injection 1 mg: 1 mg | INTRAVENOUS | @ 16:00:00 | Stop: 2022-01-15

## 2022-01-11 MED ADMIN — diphenhydrAMINE (BENADRYL) 25 mg in sodium chloride (NS) 0.9 % 25 mL IVPB: 25 mg | INTRAVENOUS | @ 23:00:00

## 2022-01-11 MED ADMIN — baclofen (LIORESAL) tablet 10 mg: 10 mg | ORAL | @ 17:00:00

## 2022-01-11 MED ADMIN — ondansetron (ZOFRAN-ODT) disintegrating tablet 8 mg: 8 mg | ORAL | @ 18:00:00

## 2022-01-11 MED ADMIN — lidocaine 4 % patch 2 patch: 2 | TRANSDERMAL | @ 13:00:00

## 2022-01-11 MED ADMIN — SORAfenib (NexAVAR) tablet 200 mg **patient supplied**: 200 mg | ORAL | @ 18:00:00

## 2022-01-11 MED ADMIN — potassium chloride 20 mEq in 100 mL IVPB Premix: 20 meq | INTRAVENOUS | @ 16:00:00 | Stop: 2022-01-11

## 2022-01-11 MED ADMIN — ondansetron (ZOFRAN) injection 4 mg: 4 mg | INTRAVENOUS | @ 10:00:00

## 2022-01-11 MED ADMIN — enoxaparin (LOVENOX) syringe 40 mg: 40 mg | SUBCUTANEOUS | @ 13:00:00

## 2022-01-11 MED ADMIN — ketorolac (TORADOL) injection 15 mg: 15 mg | INTRAVENOUS | @ 10:00:00 | Stop: 2022-01-11

## 2022-01-11 MED ADMIN — diphenhydrAMINE (BENADRYL) injection: 25 mg | INTRAVENOUS | @ 02:00:00 | Stop: 2022-01-10

## 2022-01-11 MED ADMIN — acetaminophen (TYLENOL) tablet 1,000 mg: 1000 mg | ORAL | @ 17:00:00

## 2022-01-11 MED ADMIN — baclofen (LIORESAL) tablet 10 mg: 10 mg | ORAL | @ 09:00:00

## 2022-01-11 MED ADMIN — potassium chloride 20 mEq in 100 mL IVPB Premix: 20 meq | INTRAVENOUS | @ 13:00:00 | Stop: 2022-01-11

## 2022-01-11 MED ADMIN — ondansetron (ZOFRAN) injection 4 mg: 4 mg | INTRAVENOUS

## 2022-01-11 MED ADMIN — fat emulsion 20 % with fish oil (SMOFLIPID) infusion 250 mL: 250 mL | INTRAVENOUS | @ 05:00:00 | Stop: 2022-01-11

## 2022-01-11 MED ADMIN — HYDROmorphone (PF) (DILAUDID) injection 1 mg: 1 mg | INTRAVENOUS | @ 09:00:00 | Stop: 2022-01-15

## 2022-01-11 MED ADMIN — HYDROmorphone (PF) (DILAUDID) injection 1 mg: 1 mg | INTRAVENOUS | @ 13:00:00 | Stop: 2022-01-15

## 2022-01-11 MED ADMIN — OLANZapine zydis (ZyPREXA) disintegrating tablet 5 mg: 5 mg | ORAL | @ 02:00:00

## 2022-01-11 MED ADMIN — famotidine (PEPCID) tablet 20 mg: 20 mg | ORAL | @ 02:00:00

## 2022-01-11 MED ADMIN — mirtazapine (REMERON) tablet 7.5 mg: 7.5 mg | ORAL | @ 05:00:00

## 2022-01-11 MED ADMIN — Parenteral Nutrition (CENTRAL): INTRAVENOUS | @ 05:00:00 | Stop: 2022-01-12

## 2022-01-11 MED ADMIN — cefdinir (OMNICEF) capsule 300 mg: 300 mg | ORAL | @ 13:00:00 | Stop: 2022-02-09

## 2022-01-11 MED ADMIN — acetaminophen (TYLENOL) tablet 1,000 mg: 1000 mg | ORAL | @ 09:00:00

## 2022-01-11 MED ADMIN — HYDROmorphone (PF) (DILAUDID) injection 1 mg: 1 mg | INTRAVENOUS | @ 19:00:00 | Stop: 2022-01-15

## 2022-01-11 MED ADMIN — HYDROmorphone (PF) (DILAUDID) injection 1 mg: 1 mg | INTRAVENOUS | @ 23:00:00 | Stop: 2022-01-15

## 2022-01-11 MED ADMIN — baclofen (LIORESAL) tablet 10 mg: 10 mg | ORAL | @ 02:00:00

## 2022-01-11 MED ADMIN — acetaminophen (TYLENOL) tablet 1,000 mg: 1000 mg | ORAL | @ 02:00:00

## 2022-01-11 MED ADMIN — HYDROmorphone (DILAUDID) tablet 3 mg: 3 mg | ORAL | @ 02:00:00 | Stop: 2022-01-24

## 2022-01-11 NOTE — Unmapped (Signed)
Problem: Adult Inpatient Plan of Care  Goal: Plan of Care Review  Outcome: Progressing  Goal: Patient-Specific Goal (Individualized)  Outcome: Progressing  Goal: Absence of Hospital-Acquired Illness or Injury  Outcome: Progressing  Intervention: Identify and Manage Fall Risk  Recent Flowsheet Documentation  Taken 01/10/2022 2000 by Phineas Real, RN  Safety Interventions:   low bed   lighting adjusted for tasks/safety   family at bedside   fall reduction program maintained  Intervention: Prevent and Manage VTE (Venous Thromboembolism) Risk  Recent Flowsheet Documentation  Taken 01/10/2022 2200 by Phineas Real, RN  Activity Management: activity adjusted per tolerance  Taken 01/10/2022 2000 by Phineas Real, RN  Activity Management: up ad lib  VTE Prevention/Management:   ambulation promoted   anticoagulant therapy  Goal: Optimal Comfort and Wellbeing  Outcome: Progressing  Goal: Readiness for Transition of Care  Outcome: Progressing  Goal: Rounds/Family Conference  Outcome: Progressing     Problem: Fall Injury Risk  Goal: Absence of Fall and Fall-Related Injury  Outcome: Progressing  Intervention: Promote Injury-Free Environment  Recent Flowsheet Documentation  Taken 01/10/2022 2000 by Phineas Real, RN  Safety Interventions:   low bed   lighting adjusted for tasks/safety   family at bedside   fall reduction program maintained     Problem: Malnutrition  Goal: Improved Nutritional Intake  Outcome: Progressing     Problem: Self-Care Deficit  Goal: Improved Ability to Complete Activities of Daily Living  Outcome: Progressing     Problem: Latex Allergy  Goal: Absence of Allergy Symptoms  Outcome: Progressing

## 2022-01-12 LAB — COMPREHENSIVE METABOLIC PANEL
ALBUMIN: 3.3 g/dL — ABNORMAL LOW (ref 3.4–5.0)
ALKALINE PHOSPHATASE: 49 U/L (ref 46–116)
ALT (SGPT): 15 U/L (ref 10–49)
ANION GAP: 7 mmol/L (ref 5–14)
AST (SGOT): 13 U/L (ref <=34)
BILIRUBIN TOTAL: 0.2 mg/dL — ABNORMAL LOW (ref 0.3–1.2)
BLOOD UREA NITROGEN: 11 mg/dL (ref 9–23)
BUN / CREAT RATIO: 30
CALCIUM: 8.5 mg/dL — ABNORMAL LOW (ref 8.7–10.4)
CHLORIDE: 110 mmol/L — ABNORMAL HIGH (ref 98–107)
CO2: 25 mmol/L (ref 20.0–31.0)
CREATININE: 0.37 mg/dL — ABNORMAL LOW
EGFR CKD-EPI (2021) FEMALE: >90 mL/min/{1.73_m2} (ref >=60)
GLUCOSE RANDOM: 109 mg/dL (ref 70–179)
POTASSIUM: 3.7 mmol/L (ref 3.4–4.8)
PROTEIN TOTAL: 5.8 g/dL (ref 5.7–8.2)
SODIUM: 142 mmol/L (ref 135–145)

## 2022-01-12 LAB — CBC
HEMATOCRIT: 32.8 % — ABNORMAL LOW (ref 34.0–44.0)
HEMOGLOBIN: 11.3 g/dL (ref 11.3–14.9)
MEAN CORPUSCULAR HEMOGLOBIN CONC: 34.5 g/dL (ref 32.0–36.0)
MEAN CORPUSCULAR HEMOGLOBIN: 30 pg (ref 25.9–32.4)
MEAN CORPUSCULAR VOLUME: 87 fL (ref 77.6–95.7)
MEAN PLATELET VOLUME: 9.6 fL (ref 6.8–10.7)
PLATELET COUNT: 160 10*9/L (ref 150–450)
RED BLOOD CELL COUNT: 3.77 10*12/L — ABNORMAL LOW (ref 3.95–5.13)
RED CELL DISTRIBUTION WIDTH: 12.5 % (ref 12.2–15.2)
WBC ADJUSTED: 6.2 10*9/L (ref 3.6–11.2)

## 2022-01-12 LAB — MAGNESIUM: MAGNESIUM: 2 mg/dL (ref 1.6–2.6)

## 2022-01-12 LAB — PHOSPHORUS: PHOSPHORUS: 2.9 mg/dL (ref 2.4–5.1)

## 2022-01-12 MED ADMIN — HYDROmorphone (PF) (DILAUDID) injection 1 mg: 1 mg | INTRAVENOUS | @ 01:00:00 | Stop: 2022-01-15

## 2022-01-12 MED ADMIN — acetaminophen (TYLENOL) tablet 1,000 mg: 1000 mg | ORAL | @ 20:00:00

## 2022-01-12 MED ADMIN — acetaminophen (TYLENOL) tablet 1,000 mg: 1000 mg | ORAL | @ 12:00:00

## 2022-01-12 MED ADMIN — OLANZapine zydis (ZyPREXA) disintegrating tablet 5 mg: 5 mg | ORAL | @ 02:00:00

## 2022-01-12 MED ADMIN — HYDROmorphone (PF) (DILAUDID) injection 1 mg: 1 mg | INTRAVENOUS | @ 15:00:00 | Stop: 2022-01-15

## 2022-01-12 MED ADMIN — ondansetron (ZOFRAN-ODT) disintegrating tablet 8 mg: 8 mg | ORAL | @ 11:00:00 | Stop: 2022-01-12

## 2022-01-12 MED ADMIN — ondansetron (ZOFRAN-ODT) disintegrating tablet 8 mg: 8 mg | ORAL | @ 02:00:00

## 2022-01-12 MED ADMIN — baclofen (LIORESAL) tablet 10 mg: 10 mg | ORAL | @ 02:00:00

## 2022-01-12 MED ADMIN — cefdinir (OMNICEF) capsule 300 mg: 300 mg | ORAL | @ 02:00:00 | Stop: 2022-02-09

## 2022-01-12 MED ADMIN — HYDROmorphone (DILAUDID) tablet 3 mg: 3 mg | ORAL | @ 02:00:00 | Stop: 2022-01-24

## 2022-01-12 MED ADMIN — cefdinir (OMNICEF) capsule 300 mg: 300 mg | ORAL | @ 15:00:00 | Stop: 2022-02-09

## 2022-01-12 MED ADMIN — mirtazapine (REMERON) tablet 7.5 mg: 7.5 mg | ORAL | @ 02:00:00

## 2022-01-12 MED ADMIN — enoxaparin (LOVENOX) syringe 40 mg: 40 mg | SUBCUTANEOUS | @ 15:00:00

## 2022-01-12 MED ADMIN — HYDROmorphone (PF) (DILAUDID) injection 1 mg: 1 mg | INTRAVENOUS | @ 05:00:00 | Stop: 2022-01-15

## 2022-01-12 MED ADMIN — gabapentin (NEURONTIN) capsule 200 mg: 200 mg | ORAL | @ 02:00:00

## 2022-01-12 MED ADMIN — HYDROmorphone (PF) (DILAUDID) injection 1 mg: 1 mg | INTRAVENOUS | @ 21:00:00 | Stop: 2022-01-15

## 2022-01-12 MED ADMIN — SORAfenib (NexAVAR) tablet 200 mg **patient supplied**: 200 mg | ORAL | @ 15:00:00

## 2022-01-12 MED ADMIN — HYDROmorphone (PF) (DILAUDID) injection 1 mg: 1 mg | INTRAVENOUS | @ 12:00:00 | Stop: 2022-01-15

## 2022-01-12 MED ADMIN — HYDROmorphone (PF) (DILAUDID) injection 1 mg: 1 mg | INTRAVENOUS | @ 17:00:00 | Stop: 2022-01-12

## 2022-01-12 MED ADMIN — HYDROmorphone (PF) (DILAUDID) injection 1 mg: 1 mg | INTRAVENOUS | @ 07:00:00 | Stop: 2022-01-15

## 2022-01-12 MED ADMIN — diphenhydrAMINE (BENADRYL) 25 mg in sodium chloride (NS) 0.9 % 25 mL IVPB: 25 mg | INTRAVENOUS | Stop: 2022-01-13

## 2022-01-12 MED ADMIN — baclofen (LIORESAL) tablet 10 mg: 10 mg | ORAL | @ 12:00:00

## 2022-01-12 MED ADMIN — Parenteral Nutrition (CENTRAL): INTRAVENOUS | @ 05:00:00 | Stop: 2022-01-13

## 2022-01-12 MED ADMIN — baclofen (LIORESAL) tablet 10 mg: 10 mg | ORAL | @ 20:00:00

## 2022-01-12 MED ADMIN — famotidine (PEPCID) tablet 20 mg: 20 mg | ORAL | @ 02:00:00

## 2022-01-12 MED ADMIN — acetaminophen (TYLENOL) tablet 1,000 mg: 1000 mg | ORAL | @ 02:00:00

## 2022-01-12 MED ADMIN — fat emulsion 20 % with fish oil (SMOFLIPID) infusion 250 mL: 250 mL | INTRAVENOUS | @ 05:00:00 | Stop: 2022-01-12

## 2022-01-12 MED ADMIN — simethicone (MYLICON) chewable tablet 160 mg: 160 mg | ORAL | @ 20:00:00

## 2022-01-12 MED ADMIN — lidocaine 4 % patch 2 patch: 2 | TRANSDERMAL | @ 15:00:00

## 2022-01-12 MED ADMIN — HYDROmorphone (PF) (DILAUDID) injection 1 mg: 1 mg | INTRAVENOUS | Stop: 2022-01-15

## 2022-01-12 NOTE — Unmapped (Signed)
Hospital Medicine Daily Progress Note    Assessment/Plan:    Principal Problem:    Intractable abdominal pain  Active Problems:    [name]    [name]    Dehydration    Severe protein-calorie malnutrition (CMS-HCC)  Resolved Problems:    * No resolved hospital problems. *                 [name] [name] is a 21 y.o. female with [name] (familial adenomatosis polyposis) s/p proctocolectomy with ileoanal anastomosis, desmoid tumors, anemia, severe protein calorie malnutrition that presented to [facility] with Intractable abdominal pain after recent celiac plexus block. She was also started on TPN this admission per prior outpatient plan.     Acute on chronic abdominal pain  Abdominal desmoid tumors  Patient went for celiac plexus block on 01/08/2022 with Dr. Manson [name] as an outpatient.  Patient discussed with Dr. Manson [name] by phone yesterday and based on patient's description of right flank pain and right lower quadrant pain, he felt this is most consistent with muscle spasms and referred pain from the spasms. CT in the ED showed a small locule of gas in the left perirenal fact but no renal or psoas injury. Other chronic findings were unchanged.   - Per Dr. Theora [name] recommendation, will trial toradol x 24 hours. Tizanidine also recommended, but inactive ingredient triggered allergy flag so will discuss further with patient and continue baclofen for now.   - Continue home regimen with scheduled tylenol 1000 mg q8h, oxycodone 10 mg q4h PRN during the day, and oral dilaudid 3 mg at bedtime PRN.   - While recovering from procedure, will give IV dilaudid 1 mg q3h PRN for severe breakthrough pain -- try to wean over next 24 hrs  - Continue other supportive meds (diclofenac, lidocaine patch)  - Anticipate quick improvement given this is post-procedural pain but if not, can consult palliative care.     Severe malnutrition in setting of [name] and abdominal desmoid tumors  Patient has been on TPN twice before, administered through PICC lines which then were ultimately removed and central lines placed (at least once due to clot). She was recently discharged with a corpak and plan for tube feeds at home, but continued to lose weight and have difficulty with pain control and nausea. She was seen recently by Dr. [name] and her outpatient dietitian with plans to resume TPN and had waiting for a direct admission to do. Tunneled line placed 11/3 and she was started on TPN on 11/3.  - Discussed with Dr. [name] today 11/4  - Remove Corpak  - Continue TPN daily. Dr. [name] has agreed to follow TPN orders/labs after discharge.    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 Awaiting PA for elidel. Not currently using clobetasol as she feels skin/scalp is acceptable right now  - Triamcinolone PRN  - Benadryl 25 mg IVPB infused slowly q6h prn itching or Benadryl 50 mg PO q6h prn    Chronic iron  deficiency  Follows with Hematology. Attributed to menstrual bleeding, impaired absorption from abdominal disease, and chronic GI bleeding. Had infusion reaction to sodium ferric gluconate in 04/2021 requiring ED visit for steroids and Benadryl. Per outpatient heme, can consider doing re-challenge with premedications per [facility] protocol. Discussed with patient and mom today 11/4, and will hold off on challenge for now until her acute pain has improved to avoid clouding the picture if she does have a reaction.  - Daily CBC  - [facility] premedication protocol for ferric gluconate challenge if pursued: - Prednisone 50mg  at 13h, 7h, and 1h prior to infusion.  - Benadryl 50mg  at 1h prior to infusion  - Pepcid 40mg , 1h prior to infusion  - Tylenol 650mg      FEN: Regular diet by mouth for comfort/as tolerated. TPN per dietician recommendations.   VTE ppx: LMWH. Can change to Eliquis 2.5 mg BID if patient prefers.    HCDM [facility] policy, no known patient preference): [name] [name] - Mother - [phone]    HCDM, back-up (If primary HCDM is unavailable): [name] [name] - Father - [phone]    I personally spent 50 minutes face-to-face and non-face-to-face in the care of this patient, which includes all pre, intra, and post visit time on the date of service.  All documented time was specific to the E/M visit and does not include any procedures that may have been performed.    ___________________________________________________________________    Subjective:  No acute events overnight. Tunneled line placed yesterday and started on TPN overnight. Having lots of pain in the line insertion site (near a desmoid tumor) and unable to raise her arm. Has been having multiple watery BMs. Able to walk to and from bathroom, but feels weaker/unsteadier than normal since the celiac plexus block. Having pain in multiple sites; RLQ/pelvis, back at desmoid tumor sites, upper chest at line insertion site. Skin is also very irritated and itchy; felt she got prolonged relief for 12 hrs with IV Benadryl and requesting to continue. Picking at small amounts of egg for breakfast this morning. Denies vomiting. Her maternal grandfather unfortunately passed away in the last couple of days and his funeral is scheduled for Tuesday. She is asking if she can leave the hospital for his funeral and come back, or Mom is hoping she will be ready for discharge by then.     Labs/Studies:  Labs and Studies from the last 24hrs per EMR and Reviewed    Objective:  Temp:  [36.3 ??C (97.4 ??F)-36.9 ??C (98.4 ??F)] 36.9 ??C (98.4 ??F)  Heart Rate:  [60-69] 69  Resp:  [17-20] 20  BP: (105-115)/(59-68) 106/60  SpO2:  [96 %-98 %] 98 %    Gen: Non-toxic-appearing, sitting up in bed, mom at bedside  Eyes: EOMI, conjunctivae clear  ENT: MMM, Corpak in L nare. Tunneled line in R anterior chest wall, insertion site c/d/i  CV: RRR, nl S1 S2, no m/r/g  Pulm: CTAB, nl WOB on RA, no wheezes or crackles  Abd: Soft, non-distended, mild diffuse tenderness to palpation, +BS in all 4 quadrants  Ext: WWP, no edema or cyanosis  Neuro: A&O x 3, no focal deficits  Psych: Appropriate affect

## 2022-01-12 NOTE — Unmapped (Signed)
=  Department of Anesthesiology  Pain Medicine Division    Chronic Pain Consult Note      Requesting Attending Physician:  Arman Filter, MD  Service Requesting Consult:  Med [facility]ais[facility])    Assessment/Recommendations:  The patient was seen in consultation on request of Arman Filter, MD regarding assistance with pain management. The patient is not obtaining adequate pain relief on current medication regimen.     The patient is a 21 y.o. female with [name] (familial adenomatosis polyposis) s/p proctocolectomy with ileoanal anastomosis, desmoid tumors, anemia, severe protein calorie malnutrition that presented to [facility] with Intractable abdominal pain after recent celiac plexus block. She was also started on TPN this admission per prior outpatient plan.      Chronic pain team was consulted as patient is having acute on chronic pain.  Patient recently received bilateral celiac plexus blocks on November 1 as an outpatient.  Patient reported having right-sided flank and right lower quadrant pain that was thought to be muscle spasms.  Patient was sent to the ER by [name] for possible evaluation of a perforation.  Imaging in the ED was negative for any findings.  In the past patient has had a history of having significant pain flares after trigger point injections that continues for 1 day.  She has used Toradol before which does help with pain relief.  As patient has risk of GI bleeds with normal creatinine and platelets she was recommended to use try Toradol to help with the pain flare.    We discussed continuing with the current management of pain.  We also discussed that we can try starting a ketamine infusion to see if that will help with pain control. At this time, patient wants to hold off and see how current pain medications are doing. We will re-evaluate ketamine tomorrow.     Recommendations:  -The chronic pain service is a consult service and does not place orders, just makes recommendations (except ketamine and lidocaine infusions)  -Please evaluate all patients on opioids for appropriateness of prescribing narcan at discharge.  The chronic pain service can assist with this.  Nasal narcan covered by most insurance.  -Recommendations given apply to the current hospitalization and do not reflect long term recommendations.  -Continue Tylenol 1 g every 8 hours  -Continue baclofen 10 mg every 8 hours  -Continue gabapentin 200 every night  -Continue oxycodone 10 mg every 4 hours as needed  -Continue p.o. Dilaudid 3 mg every night as needed  -Continue lidocaine patch  -Continue IV Dilaudid every 3 hours as needed 1 mg  -Try Toradol 15 mg every 6 hours as needed with close monitoring as patient does have risk for GI bleeds          We will continue to follow.    Medications trialed during this admission (or pertinent in past):   Tylenol   baclofen  Gabapentin   oxycodone   p.o. Dilaudid   IV Dilaudid   lidocaine patch      Naloxone Rx at discharge?  Is patient on opioids? Yes.  1)Is dose >50MME?  Yes.  2) Is patient prescribed a benzodiazepine (w opioids)? No.  3)Hx of overdose?  No.  4) Hx of substance use disorder? No.  5) Opioids likely to last greater than a week after discharge? Yes.    If yes to 2 or more, prescribe naloxone at discharge.  Nasal narcan for most insured (Nasal narcan 4mg /actuation, prescribe 1 kit,  instructions at [email or web address]).  For uninsured, chronic pain can work to assist in finding an option.  OTC nasal narcan now available at most pharmacies for around $45.    History of Present Illness:    Reason for Consult:  acute on chronic pain    [name] [name] is seen in consultation at the request of patient's primary team in the hospital.  Patient is a chronic pain patient of [name] at the [facility] pain clinic.  Patient recently received bilateral celiac plexus blocks on November 1.  Since this injection, she has been having right-sided abdominal and flank pain. Patient came to the ED after discussing with [name] that she was having intractable abdominal pain.  Imaging was performed and it was negative for any acute findings.    Patient states she has been taking her medications as advised by [name] however that does not seem to be helping.  She is currently being managed by IV Dilaudid every 3 hours.    For the pain, the patient's current home medication regimen includes:  -Gabapentin 200 mg at night  -Baclofen 5 mg 3 times daily  -Butrans patch 10 mics per hour  -Oxycodone 10 mg every 4 hours as needed  -Dilaudid 3 mg p.o. nightly       Prior to admission, the patient was on home opiate medications.  Shehas been followed by a pain clinic.  The [place] CSRS was reviewed.    Allergies    Allergies   Allergen Reactions    Adhesive Tape-Silicones Hives and Rash     Paper tape  And tegederm ok    Ferrlecit [Sodium Ferric Gluconat-Sucrose] Swelling and Rash    Levofloxacin Swelling and Rash     Swelling in mouth, rash,     Methylnaltrexone      Per Patient: I lost bowel control, severe abdominal cramping, and elevated BP    Neomycin Swelling     Rxn after ear drops; ear swelling    Papaya Hives    Morphine Nausea And Vomiting    Zosyn [Piperacillin-Tazobactam] Itching and Rash     Red and itchy    Compazine [Prochlorperazine] Other (See Comments)     Extreme agitation    Latex, Natural Rubber Rash       Home Medications    Facility-Administered Medications Prior to Admission   Medication Dose Route Frequency Provider Last Rate Last Admin    ketorolac (TORADOL) injection 15 mg  15 mg Intravenous Once Nat Christen., MD         Medications Prior to Admission   Medication Sig Dispense Refill Last Dose    acetaminophen (TYLENOL) 500 MG tablet Take 2 tablets (1,000 mg total) by mouth two (2) times a day.       baclofen (LIORESAL) 5 mg Tab tablet Take 1-2 tablets (5-10 mg total) by mouth two (2) times a day as needed for muscle spasms. 40 tablet 0     cefdinir (OMNICEF) 300 MG capsule Take 1 capsule (300 mg total) by mouth Two (2) times a day. 180 capsule 1     diclofenac sodium (VOLTAREN) 1 % gel Apply 2 g topically four (4) times a day. 100 g 0     famotidine (PEPCID) 20 MG tablet Take 1 tablet (20 mg total) by mouth nightly. 90 tablet 3     gabapentin (NEURONTIN) 100 MG capsule Take 100mg  in the morning and afternoon. Take 300 mg at night. 120 capsule 3  hydrOXYzine (ATARAX) 25 MG tablet Take 1 tablet (25 mg total) by mouth every six (6) hours as needed for itching or anxiety (also insomnia). 20 tablet 0     hyoscyamine (LEVSIN) 0.125 mg tablet Take 1 tablet (0.125 mg total) by mouth every four (4) hours as needed for cramping.       lidocaine (LIDODERM) 5 % patch Place 1 patch on the skin daily. Apply to affected area for 12 hours only each day (then remove patch)       menthol-zinc oxide (CALMOSEPTINE) 0.44-20.6 % Oint Apply topically four (4) times a day as needed. 113 g 11     mirtazapine (REMERON) 7.5 MG tablet Take 1 tablet (7.5 mg total) by mouth nightly. 90 tablet 3     OLANZapine zydis (ZYPREXA) 5 MG disintegrating tablet Take 1 tablet (5 mg total) by mouth nightly. 30 tablet 2     ondansetron (ZOFRAN-ODT) 8 MG disintegrating tablet Take 1 tablet (8 mg total) by mouth every twelve (12) hours as needed for nausea.       oxyCODONE (ROXICODONE) 10 mg immediate release tablet Take 1 tablet (10 mg total) by mouth every four (4) hours as needed for pain. Fill on or after: 01/01/22 180 tablet 0     silver sulfaDIAZINE (SILVADENE) 1 % cream Apply topically daily. To burn on leg 50 g 11     SORAfenib (NEXAVAR) 200 mg tablet Take 1 tablet (200 mg total) by mouth daily. 60 tablet 2     triamcinolone (KENALOG) 0.1 % cream Apply topically two (2) times a day. 28 g 3     zinc oxide 10 % Crea Apply 1 application. topically daily as needed.       clobetasoL (CLOBEX) 0.05 % shampoo Apply topically two (2) times a day. (Patient not taking: Reported on 01/02/2022) 118 mL 0 COURIERED MED OR SUPPLY [id number], SORAFENIB 200MG  TABLET 1 each 0     hydrocortisone 2.5 % cream Apply 1 application. topically daily as needed.       HYDROmorphone (DILAUDID) 2 MG tablet Take 1.5 tablets (3 mg total) by mouth every three (3) hours as needed for moderate pain or severe pain. 120 tablet 0     naloxone (NARCAN) 4 mg nasal spray One spray in either nostril once for known/suspected opioid overdose. May repeat every 2-3 minutes in alternating nostril til EMS arrives 2 each 0     pimecrolimus (ELIDEL) 1 % cream Apply topically two (2) times a day. To face as needed for rash 30 g 11        Inpatient Medications  Current Facility-Administered Medications   Medication Dose Route Frequency Provider Last Rate Last Admin    acetaminophen (TYLENOL) tablet 1,000 mg  1,000 mg Oral Q8H Kateri Plummer, MD   1,000 mg at 01/12/22 9811    baclofen (LIORESAL) tablet 10 mg  10 mg Oral Q8H Kateri Plummer, MD   10 mg at 01/12/22 9147    cefdinir (OMNICEF) capsule 300 mg  300 mg Oral BID Kateri Plummer, MD   300 mg at 01/12/22 0943    dexAMETHasone (DECADRON) 4 mg/mL injection 20 mg  20 mg Intravenous Once PRN Sanoff, Mabeline Caras, MD        diclofenac sodium (VOLTAREN) 1 % gel 2 g  2 g Topical QID PRN Kateri Plummer, MD        diphenhydrAMINE (BENADRYL) 25 mg in sodium chloride (NS) 0.9 % 25 mL IVPB  25  mg Intravenous Q6H PRN Arman Filter, MD   Stopped at 01/11/22 1847    diphenhydrAMINE (BENADRYL) capsule/tablet 50 mg  50 mg Oral Q6H PRN Arman Filter, MD        diphenhydrAMINE (BENADRYL) injection 25 mg  25 mg Intravenous Q4H PRN Sanoff, Mabeline Caras, MD        enoxaparin (LOVENOX) syringe 40 mg  40 mg Subcutaneous Q24H [facility] Arman Filter, MD   40 mg at 01/12/22 0943    EPINEPHrine (EPIPEN) injection 0.3 mg  0.3 mg Intramuscular Daily PRN Roni Bread, MD        famotidine (PEPCID) tablet 20 mg  20 mg Oral Nightly Kateri Plummer, MD   20 mg at 01/11/22 2159    Parenteral Nutrition (CENTRAL)   Intravenous Continuous Arman Filter, MD 40 mL/hr at 01/12/22 0030 New Bag at 01/12/22 0030    And    fat emulsion 20 % with fish oil (SMOFLIPID) infusion 250 mL  250 mL Intravenous Continuous Arman Filter, MD   Stopped at 01/12/22 1147    [START ON 01/13/2022] Parenteral Nutrition (CENTRAL)   Intravenous Continuous Arman Filter, MD        And    [START ON 01/13/2022] fat emulsion 20 % with fish oil (SMOFLIPID) infusion 250 mL  250 mL Intravenous Continuous Arman Filter, MD        gabapentin (NEURONTIN) capsule 200 mg  200 mg Oral Nightly Kateri Plummer, MD   200 mg at 01/11/22 2159    hydrocortisone 1 % cream   Topical BID PRN Dorise Hiss, ACNP        HYDROmorphone (DILAUDID) tablet 3 mg  3 mg Oral Nightly PRN Kateri Plummer, MD   3 mg at 01/11/22 2202    HYDROmorphone (PF) (DILAUDID) injection 1 mg  1 mg Intravenous Q3H PRN Kateri Plummer, MD   1 mg at 01/12/22 0943    hydrOXYzine (ATARAX) tablet 50 mg  50 mg Oral Q6H PRN Arman Filter, MD        hyoscyamine (LEVSIN) tablet 0.125 mg  0.125 mg Oral Q4H PRN Kateri Plummer, MD        IP OKAY TO TREAT   Other Continuous PRN Sanoff, Mabeline Caras, MD        lidocaine 4 % patch 2 patch  2 patch Transdermal Daily Kateri Plummer, MD   2 patch at 01/12/22 0943    melatonin tablet 3 mg  3 mg Oral Nightly PRN Kateri Plummer, MD        methylPREDNISolone sodium succinate (PF) (SOLU-Medrol) injection 125 mg  125 mg Intravenous Q4H PRN Sanoff, Mabeline Caras, MD        mirtazapine (REMERON) tablet 7.5 mg  7.5 mg Oral Nightly Kateri Plummer, MD   7.5 mg at 01/11/22 2159    naloxone [facility]) injection 0.1 mg  0.1 mg Intravenous Q5 Min PRN Kateri Plummer, MD        OLANZapine zydis (ZyPREXA) disintegrating tablet 5 mg  5 mg Oral Nightly Kateri Plummer, MD   5 mg at 01/11/22 2159    ondansetron (ZOFRAN-ODT) disintegrating tablet 4 mg  4 mg Oral Q6H PRN Arman Filter, MD        Or    ondansetron [facility]) injection 4 mg  4 mg Intravenous Q6H PRN Arman Filter, MD        oxyCODONE (ROXICODONE) immediate  release tablet 10 mg  10 mg Oral Q4H PRN Kateri Plummer, MD        senna [facility]) tablet 2 tablet  2 tablet Oral Nightly PRN Kateri Plummer, MD        sodium chloride (NS) 0.9 % infusion  20 mL/hr Intravenous Continuous PRN Sanoff, Mabeline Caras, MD        sodium chloride 0.9% (NS) bolus 1,000 mL  1,000 mL Intravenous Daily PRN Sanoff, Mabeline Caras, MD        SORAfenib (NexAVAR) tablet 200 mg **patient supplied**  200 mg Oral Daily Sanoff, Mabeline Caras, MD   200 mg at 01/12/22 0945    triamcinolone (KENALOG) 0.1 % ointment   Topical BID PRN Kateri Plummer, MD             Past Medical History    Past Medical History:   Diagnosis Date    Abdominal pain     Acid reflux     occas    Anesthesia complication     itching, shaking, coldness; last few surgeries have gone much better    Cancer (CMS-HCC)     Cataract of right eye     COVID-19 virus infection 01/2019    Cyst of thyroid determined by ultrasound     monitoring    [name]     2 right forearm, 1 left thigh, 1 right scapula, 1 under left clavicle; multiple    Difficult intravenous access     [name] (familial adenomatous polyposis)     [name]     Gastric polyps     History of chemotherapy     last treatment approx 05/2019    History of colon polyps     History of COVID-19 01/2019    Iron deficiency anemia due to chronic blood loss     received iron infusion 11-2019    PONV (postoperative nausea and vomiting)     Rectal bleeding     Syncopal episodes     especially if becoming dehydrated           Past Surgical History    Past Surgical History:   Procedure Laterality Date    cyroablation      cystis removal      desmoid removal      PR CLOSE ENTEROSTOMY,RESEC+ANAST N/A 10/09/2020    Procedure: ILEOSTOMY TAKEDOWN;  Surgeon: Mickle Asper, MD;  Location: OR [place];  Service: General Surgery    PR COLONOSCOPY W/BIOPSY SINGLE/MULTIPLE N/A 10/27/2012    Procedure: COLONOSCOPY, FLEXIBLE, PROXIMAL TO SPLENIC FLEXURE; WITH BIOPSY, SINGLE OR MULTIPLE;  Surgeon: Shirlyn Goltz Mir, MD;  Location: PEDS PROCEDURE ROOM [facility];  Service: Gastroenterology    PR COLONOSCOPY W/BIOPSY SINGLE/MULTIPLE N/A 09/14/2013    Procedure: COLONOSCOPY, FLEXIBLE, PROXIMAL TO SPLENIC FLEXURE; WITH BIOPSY, SINGLE OR MULTIPLE;  Surgeon: Shirlyn Goltz Mir, MD;  Location: PEDS PROCEDURE ROOM [facility];  Service: Gastroenterology    PR COLONOSCOPY W/BIOPSY SINGLE/MULTIPLE N/A 11/08/2014    Procedure: COLONOSCOPY, FLEXIBLE, PROXIMAL TO SPLENIC FLEXURE; WITH BIOPSY, SINGLE OR MULTIPLE;  Surgeon: Arnold Long Mir, MD;  Location: PEDS PROCEDURE ROOM [facility];  Service: Gastroenterology    PR COLONOSCOPY W/BIOPSY SINGLE/MULTIPLE N/A 12/26/2015    Procedure: COLONOSCOPY, FLEXIBLE, PROXIMAL TO SPLENIC FLEXURE; WITH BIOPSY, SINGLE OR MULTIPLE;  Surgeon: Arnold Long Mir, MD;  Location: PEDS PROCEDURE ROOM [facility];  Service: Gastroenterology    PR COLONOSCOPY W/BIOPSY SINGLE/MULTIPLE N/A 09/02/2017    Procedure: COLONOSCOPY, FLEXIBLE, PROXIMAL TO SPLENIC FLEXURE; WITH BIOPSY,  SINGLE OR MULTIPLE;  Surgeon: Arnold Long Mir, MD;  Location: PEDS PROCEDURE ROOM [facility];  Service: Gastroenterology    PR COLSC FLX W/REMOVAL LESION BY HOT BX FORCEPS N/A 08/27/2016    Procedure: COLONOSCOPY, FLEXIBLE, PROXIMAL TO SPLENIC FLEXURE; W/REMOVAL TUMOR/POLYP/OTHER LESION, HOT BX FORCEP/CAUTE;  Surgeon: Arnold Long Mir, MD;  Location: PEDS PROCEDURE ROOM [facility];  Service: Gastroenterology    PR COLSC FLX W/RMVL OF TUMOR POLYP LESION SNARE TQ N/A 02/25/2019    Procedure: COLONOSCOPY FLEX; W/REMOV TUMOR/LES BY SNARE;  Surgeon: Helyn Numbers, MD;  [facility] [place] [facility];  Service: Gastroenterology    PR COLSC FLX W/RMVL OF TUMOR POLYP LESION SNARE TQ N/A 03/13/2020    Procedure: COLONOSCOPY FLEX; W/REMOV TUMOR/LES BY SNARE;  Surgeon: Helyn Numbers, MD;  Location: [facility] [place] [facility];  Service: Gastroenterology    PR EXC SKIN BENIG 2.1-3 CM TRUNK,ARM,LEG Right 02/25/2017    Procedure: EXCISION, BENIGN LESION INCLUDE MARGINS, EXCEPT SKIN TAG, LEGS; EXCISED DIAMETER 2.1 TO 3.0 CM;  Surgeon: Clarene Duke, MD;  Location: CHILDRENS OR [facility];  Service: Plastics    PR EXC SKIN BENIG 3.1-4 CM TRUNK,ARM,LEG Right 02/25/2017    Procedure: EXCISION, BENIGN LESION INCLUDE MARGINS, EXCEPT SKIN TAG, ARMS; EXCISED DIAMETER 3.1 TO 4.0 CM;  Surgeon: Clarene Duke, MD;  Location: CHILDRENS OR [facility];  Service: Plastics    PR EXC SKIN BENIG >4 CM FACE,FACIAL Right 02/25/2017    Procedure: EXCISION, OTHER BENIGN LES INCLUD MARGIN, FACE/EARS/EYELIDS/NOSE/LIPS/MUCOUS MEMBRANE; EXCISED DIAM >4.0 CM;  Surgeon: Clarene Duke, MD;  Location: CHILDRENS OR [facility];  Service: Plastics    PR EXC TUMOR SOFT TISSUE LEG/ANKLE SUBQ 3+CM Right 08/05/2019    Procedure: EXCISION, TUMOR, SOFT TISSUE OF LEG OR ANKLE AREA, SUBCUTANEOUS; 3 CM OR GREATER;  Surgeon: Arsenio Katz, MD;  Location: MAIN OR [place];  Service: Plastics    PR EXC TUMOR SOFT TISSUE LEG/ANKLE SUBQ <3CM Right 08/05/2019    Procedure: EXCISION, TUMOR, SOFT TISSUE OF LEG OR ANKLE AREA, SUBCUTANEOUS; LESS THAN 3 CM;  Surgeon: Arsenio Katz, MD;  Location: MAIN OR [facility];  Service: Plastics    PR LAP, SURG PROCTECTOMY W J-POUCH N/A 08/10/2020    Procedure: ROBOTIC ASSISTED LAPAROSCOPIC PROCTOCOLECTOMY, ILEAL J POUCH, WITH OSTOMY;  Surgeon: Mickle Asper, MD;  Location: OR [place];  Service: General Surgery    PR NDSC EVAL INTSTINAL POUCH DX W/COLLJ SPEC SPX N/A 01/23/2021    Procedure: ENDO EVAL SM INTEST POUCH; DX;  Surgeon: Modena Nunnery, MD;  [facility]: [facility] [place] [facility];  Service: Gastroenterology    PR NDSC EVAL INTSTINAL POUCH DX W/COLLJ SPEC SPX N/A 08/27/2021    Procedure: ENDO EVAL SM INTEST POUCH; DX;  Surgeon: Hunt Oris, MD;  Location: [facility]URES [facility];  Service: Gastroenterology    PR NDSC EVAL INTSTINAL POUCH DX W/COLLJ SPEC SPX N/A 12/09/2021    Procedure: ENDO EVAL SM INTEST POUCH; DX;  Surgeon: Vidal Schwalbe, MD;  Location: GI PROCEDURES [facility];  Service: Gastroenterology    PR UNLISTED PROCEDURE SMALL INTESTINE  01/23/2021    Procedure: UNLISTED PROCEDURE, SMALL INTESTINE;  Surgeon: Modena Nunnery, MD;  Location: [place] [facility] [place] [facility];  Service: Gastroenterology    PR UPPER GI ENDOSCOPY,BIOPSY N/A 10/27/2012    Procedure: UGI ENDOSCOPY; WITH BIOPSY, SINGLE OR MULTIPLE;  Surgeon: Shirlyn Goltz Mir, MD;  Location: PEDS PROCEDURE ROOM [facility];  Service: Gastroenterology    PR UPPER GI ENDOSCOPY,BIOPSY N/A 09/14/2013  Procedure: UGI ENDOSCOPY; WITH BIOPSY, SINGLE OR MULTIPLE;  Surgeon: Shirlyn Goltz Mir, MD;  Location: PEDS PROCEDURE ROOM [facility];  Service: Gastroenterology    PR UPPER GI ENDOSCOPY,BIOPSY N/A 11/08/2014    Procedure: UGI ENDOSCOPY; WITH BIOPSY, SINGLE OR MULTIPLE;  Surgeon: Arnold Long Mir, MD;  Location: PEDS PROCEDURE ROOM [facility];  Service: Gastroenterology    PR UPPER GI ENDOSCOPY,BIOPSY N/A 12/26/2015    Procedure: UGI ENDOSCOPY; WITH BIOPSY, SINGLE OR MULTIPLE;  Surgeon: Arnold Long Mir, MD;  Location: PEDS PROCEDURE ROOM [facility];  Service: Gastroenterology    PR UPPER GI ENDOSCOPY,BIOPSY N/A 08/27/2016    Procedure: UGI ENDOSCOPY; WITH BIOPSY, SINGLE OR MULTIPLE;  Surgeon: Arnold Long Mir, MD;  Location: PEDS PROCEDURE ROOM [facility];  Service: Gastroenterology    PR UPPER GI ENDOSCOPY,BIOPSY N/A 09/02/2017    Procedure: UGI ENDOSCOPY; WITH BIOPSY, SINGLE OR MULTIPLE;  Surgeon: Arnold Long Mir, MD;  Location: PEDS PROCEDURE ROOM [facility];  Service: Gastroenterology    PR UPPER GI ENDOSCOPY,BIOPSY N/A 03/13/2020    Procedure: UGI ENDOSCOPY; WITH BIOPSY, SINGLE OR MULTIPLE;  Surgeon: Helyn Numbers, MD;  Location: [facility] PROCEDURES [place] [facility];  Service: Gastroenterology    PR UPPER GI ENDOSCOPY,BIOPSY N/A 09/05/2021    Procedure: UGI ENDOSCOPY; WITH BIOPSY, SINGLE OR MULTIPLE;  Surgeon: Wendall Papa, MD;  Location: GI PROCEDURES [facility];  Service: Gastroenterology    TUMOR REMOVAL      multiple-head, neck, back, hand, right flank, multiple           Family History    Family History   Problem Relation Age of Onset    No Known Problems Mother     No Known Problems Father     No Known Problems Sister     No Known Problems Brother     Stroke Maternal Grandmother     Other Maternal Grandmother         benign lesions of liver and pancreas, further details unknown    Cancer Maternal Grandmother     Diabetes Maternal Grandmother     Hypertension Maternal Grandmother     Thyroid disease Maternal Grandmother     Arthritis Maternal Grandfather     Asthma Maternal Grandfather     COPD Paternal Grandmother         Deceased    Miscarriages / Stillbirths Paternal Grandmother     Alcohol abuse Paternal Grandfather         Deceased    No Known Problems Maternal Aunt     No Known Problems Maternal Uncle     No Known Problems Paternal Aunt     No Known Problems Paternal Uncle     Anesthesia problems Neg Hx     Broken bones Neg Hx     Cancer Neg Hx     Clotting disorder Neg Hx     Collagen disease Neg Hx     Diabetes Neg Hx     Dislocations Neg Hx     Fibromyalgia Neg Hx     Gout Neg Hx     Hemophilia Neg Hx     Osteoporosis Neg Hx     Rheumatologic disease Neg Hx     Scoliosis Neg Hx     Severe sprains Neg Hx     Sickle cell anemia Neg Hx     Spinal Compression Fracture Neg Hx     Melanoma Neg Hx     Basal cell carcinoma Neg Hx     Squamous cell carcinoma Neg Hx  Social History:  Social History     Tobacco Use   Smoking Status Never    Passive exposure: Past   Smokeless Tobacco Never     Social History     Substance and Sexual Activity   Alcohol Use Never     Social History     Substance and Sexual Activity   Drug Use Never         Review of Systems    A 12 system review of systems was negative except as noted in HPI    Pertinent Imaging/Labs:  reviewed    Lab Results   Component Value Date    CREATININE 0.37 (L) 01/12/2022       Lab Results   Component Value Date    ALKPHOS 49 01/12/2022    BILITOT 0.2 (L) 01/12/2022    BILIDIR 0.20 12/04/2021    PROT 5.8 01/12/2022    ALBUMIN 3.3 (L) 01/12/2022    ALT 15 01/12/2022    AST 13 01/12/2022       Encounter Date: 01/10/22   ECG 12 Lead   Result Value    EKG Systolic BP     EKG Diastolic BP     EKG Ventricular Rate 61    EKG Atrial Rate 61    EKG P-R Interval 128    EKG QRS Duration 86    EKG Q-T Interval 430    EKG QTC Calculation 432    EKG Calculated P Axis 80    EKG Calculated R Axis 87    EKG Calculated T Axis 78    QTC Fredericia 432    Narrative    NORMAL SINUS RHYTHM  NORMAL ECG  WHEN COMPARED WITH ECG OF 20-Dec-2021 11:19,  NO SIGNIFICANT CHANGE WAS FOUND  Confirmed by Eldred Manges ([id number]) on 01/10/2022 11:46:26 AM       No results found for requested labs within last 2 days.       Objective:     Vital Signs  Temp:  [35.7 ??C (96.2 ??F)-36.9 ??C (98.4 ??F)] 35.7 ??C (96.2 ??F)  Heart Rate:  [69-82] 82  Resp:  [20] 20  BP: (106-127)/(60-70) 113/62  MAP (mmHg):  [77-90] 81  SpO2:  [98 %] 98 %    Physical Exam    GENERAL:  Well developed, well-nourished female and is in no apparent distress.   HEAD/NECK:    Reveals normocephalic/atraumatic.   CARDIOVASCULAR:   Warm, well perfused  LUNGS:   Normal work of breathing  EXTREMITIES:  Warm, no clubbing, cyanosis, or edema was noted.  NEUROLOGIC:    The patient was alert and oriented times four with normal language, attention, cognition and memory. Cranial nerve exam was grossly normal.  Sensation Intact to light touch throughout the bilateral upper and lower extremities.  MUSCULOSKELETAL:    Motor function  5/5 in upper and lower extremities.    SKIN:  No obvious rashes lesions or erythema  PSY:  Appropriate affect and mood.      Problem List    Principal Problem:    Intractable abdominal pain  Active Problems:    [name] [name]    Dehydration    Severe protein-calorie malnutrition (CMS-HCC)

## 2022-01-12 NOTE — Unmapped (Signed)
Patient A&Ox4. Up ad lib. PRN IV Dilaudid given x3 with fourth dose requested. PRN oral dilaudid given x1. Zofran given x1. No acute events overnight.   Problem: Adult Inpatient Plan of Care  Goal: Plan of Care Review  Outcome: Progressing  Goal: Patient-Specific Goal (Individualized)  Outcome: Progressing  Goal: Absence of Hospital-Acquired Illness or Injury  Outcome: Progressing  Intervention: Identify and Manage Fall Risk  Recent Flowsheet Documentation  Taken 01/12/2022 0400 by Erich Montane, RN  Safety Interventions:   fall reduction program maintained   family at bedside   low bed  Taken 01/12/2022 0200 by Erich Montane, RN  Safety Interventions:   fall reduction program maintained   family at bedside   low bed  Taken 01/12/2022 0000 by Erich Montane, RN  Safety Interventions:   family at bedside   low bed  Taken 01/11/2022 1925 by Erich Montane, RN  Safety Interventions:   family at bedside   low bed  Intervention: Prevent Skin Injury  Recent Flowsheet Documentation  Taken 01/11/2022 2214 by Erich Montane, RN  Positioning for Skin: (self turn) Other (Comment)  Goal: Optimal Comfort and Wellbeing  Outcome: Progressing  Goal: Readiness for Transition of Care  Outcome: Progressing  Goal: Rounds/Family Conference  Outcome: Progressing     Problem: Fall Injury Risk  Goal: Absence of Fall and Fall-Related Injury  Outcome: Progressing  Intervention: Promote Injury-Free Environment  Recent Flowsheet Documentation  Taken 01/12/2022 0400 by Erich Montane, RN  Safety Interventions:   fall reduction program maintained   family at bedside   low bed  Taken 01/12/2022 0200 by Erich Montane, RN  Safety Interventions:   fall reduction program maintained   family at bedside   low bed  Taken 01/12/2022 0000 by Erich Montane, RN  Safety Interventions:   family at bedside   low bed  Taken 01/11/2022 1925 by Erich Montane, RN  Safety Interventions:   family at bedside   low bed     Problem: Malnutrition  Goal: Improved Nutritional Intake  Outcome: Progressing     Problem: Self-Care Deficit  Goal: Improved Ability to Complete Activities of Daily Living  Outcome: Progressing     Problem: Latex Allergy  Goal: Absence of Allergy Symptoms  Outcome: Progressing

## 2022-01-13 LAB — CBC
HEMATOCRIT: 33.6 % — ABNORMAL LOW (ref 34.0–44.0)
HEMOGLOBIN: 11.4 g/dL (ref 11.3–14.9)
MEAN CORPUSCULAR HEMOGLOBIN CONC: 33.9 g/dL (ref 32.0–36.0)
MEAN CORPUSCULAR HEMOGLOBIN: 29.4 pg (ref 25.9–32.4)
MEAN CORPUSCULAR VOLUME: 86.8 fL (ref 77.6–95.7)
MEAN PLATELET VOLUME: 9.9 fL (ref 6.8–10.7)
PLATELET COUNT: 171 10*9/L (ref 150–450)
RED BLOOD CELL COUNT: 3.87 10*12/L — ABNORMAL LOW (ref 3.95–5.13)
RED CELL DISTRIBUTION WIDTH: 12.4 % (ref 12.2–15.2)
WBC ADJUSTED: 6 10*9/L (ref 3.6–11.2)

## 2022-01-13 LAB — COMPREHENSIVE METABOLIC PANEL
ALBUMIN: 3 g/dL — ABNORMAL LOW (ref 3.4–5.0)
ALKALINE PHOSPHATASE: 52 U/L (ref 46–116)
ALT (SGPT): 12 U/L (ref 10–49)
ANION GAP: 4 mmol/L — ABNORMAL LOW (ref 5–14)
AST (SGOT): 13 U/L (ref <=34)
BILIRUBIN TOTAL: 0.2 mg/dL — ABNORMAL LOW (ref 0.3–1.2)
BLOOD UREA NITROGEN: 13 mg/dL (ref 9–23)
BUN / CREAT RATIO: 35
CALCIUM: 8.4 mg/dL — ABNORMAL LOW (ref 8.7–10.4)
CHLORIDE: 109 mmol/L — ABNORMAL HIGH (ref 98–107)
CO2: 29 mmol/L (ref 20.0–31.0)
CREATININE: 0.37 mg/dL — ABNORMAL LOW
EGFR CKD-EPI (2021) FEMALE: >90 mL/min/{1.73_m2} (ref >=60)
GLUCOSE RANDOM: 117 mg/dL (ref 70–179)
POTASSIUM: 3.9 mmol/L (ref 3.4–4.8)
PROTEIN TOTAL: 5.7 g/dL (ref 5.7–8.2)
SODIUM: 142 mmol/L (ref 135–145)

## 2022-01-13 LAB — MAGNESIUM: MAGNESIUM: 1.9 mg/dL (ref 1.6–2.6)

## 2022-01-13 LAB — PHOSPHORUS: PHOSPHORUS: 2.7 mg/dL (ref 2.4–5.1)

## 2022-01-13 MED ADMIN — acetaminophen (TYLENOL) tablet 1,000 mg: 1000 mg | ORAL | @ 03:00:00

## 2022-01-13 MED ADMIN — ketamine 600 mg in 60 mL (10 mg/mL) infusion: .05 mg/kg/h | INTRAVENOUS | @ 23:00:00

## 2022-01-13 MED ADMIN — acetaminophen (TYLENOL) tablet 1,000 mg: 1000 mg | ORAL | @ 10:00:00

## 2022-01-13 MED ADMIN — HYDROmorphone (PF) (DILAUDID) injection 1 mg: 1 mg | INTRAVENOUS | @ 18:00:00 | Stop: 2022-01-15

## 2022-01-13 MED ADMIN — HYDROmorphone (PF) (DILAUDID) injection 1 mg: 1 mg | INTRAVENOUS | @ 22:00:00 | Stop: 2022-01-15

## 2022-01-13 MED ADMIN — baclofen (LIORESAL) tablet 10 mg: 10 mg | ORAL | @ 18:00:00

## 2022-01-13 MED ADMIN — fat emulsion 20 % with fish oil (SMOFLIPID) infusion 250 mL: 250 mL | INTRAVENOUS | @ 06:00:00 | Stop: 2022-01-13

## 2022-01-13 MED ADMIN — HYDROmorphone (PF) (DILAUDID) injection 1 mg: 1 mg | INTRAVENOUS | @ 15:00:00 | Stop: 2022-01-15

## 2022-01-13 MED ADMIN — ondansetron (ZOFRAN-ODT) disintegrating tablet 4 mg: 4 mg | ORAL | @ 22:00:00

## 2022-01-13 MED ADMIN — enoxaparin (LOVENOX) syringe 40 mg: 40 mg | SUBCUTANEOUS | @ 15:00:00

## 2022-01-13 MED ADMIN — famotidine (PEPCID) tablet 20 mg: 20 mg | ORAL | @ 03:00:00

## 2022-01-13 MED ADMIN — cefdinir (OMNICEF) capsule 300 mg: 300 mg | ORAL | @ 03:00:00 | Stop: 2022-02-09

## 2022-01-13 MED ADMIN — OLANZapine zydis (ZyPREXA) disintegrating tablet 5 mg: 5 mg | ORAL | @ 03:00:00

## 2022-01-13 MED ADMIN — acetaminophen (TYLENOL) tablet 1,000 mg: 1000 mg | ORAL | @ 18:00:00

## 2022-01-13 MED ADMIN — SORAfenib (NexAVAR) tablet 200 mg **patient supplied**: 200 mg | ORAL | @ 15:00:00

## 2022-01-13 MED ADMIN — triamcinolone (KENALOG) 0.1 % ointment: TOPICAL | @ 15:00:00

## 2022-01-13 MED ADMIN — baclofen (LIORESAL) tablet 10 mg: 10 mg | ORAL | @ 10:00:00

## 2022-01-13 MED ADMIN — baclofen (LIORESAL) tablet 10 mg: 10 mg | ORAL | @ 03:00:00

## 2022-01-13 MED ADMIN — lidocaine 4 % patch 2 patch: 2 | TRANSDERMAL | @ 15:00:00

## 2022-01-13 MED ADMIN — sodium chloride (NS) 0.9 % infusion: 10 mL/h | INTRAVENOUS | @ 19:00:00

## 2022-01-13 MED ADMIN — mirtazapine (REMERON) tablet 7.5 mg: 7.5 mg | ORAL | @ 03:00:00

## 2022-01-13 MED ADMIN — HYDROmorphone (PF) (DILAUDID) injection 1 mg: 1 mg | INTRAVENOUS | @ 10:00:00 | Stop: 2022-01-15

## 2022-01-13 MED ADMIN — HYDROmorphone (DILAUDID) tablet 3 mg: 3 mg | ORAL | @ 06:00:00 | Stop: 2022-01-24

## 2022-01-13 MED ADMIN — gabapentin (NEURONTIN) capsule 200 mg: 200 mg | ORAL | @ 03:00:00

## 2022-01-13 MED ADMIN — HYDROmorphone (PF) (DILAUDID) injection 1 mg: 1 mg | INTRAVENOUS | @ 03:00:00 | Stop: 2022-01-15

## 2022-01-13 MED ADMIN — cefdinir (OMNICEF) capsule 300 mg: 300 mg | ORAL | @ 15:00:00 | Stop: 2022-02-09

## 2022-01-13 MED ADMIN — ondansetron (ZOFRAN-ODT) disintegrating tablet 4 mg: 4 mg | ORAL | @ 10:00:00

## 2022-01-13 MED ADMIN — Parenteral Nutrition (CENTRAL): INTRAVENOUS | @ 06:00:00 | Stop: 2022-01-14

## 2022-01-13 MED ADMIN — simethicone (MYLICON) chewable tablet 160 mg: 160 mg | ORAL | @ 15:00:00

## 2022-01-13 NOTE — Unmapped (Signed)
Care Management  Initial Transition Planning Assessment              General  Care Manager assessed the patient by : In person interview with patient, In person interview with family  Orientation Level: Oriented X4  Functional level prior to admission: Independent (Currently on TPN and Ketamine drip)  Reason for referral: Discharge Planning, Home Health (Home Infusion)    Contact/Decision Maker  Extended Emergency Contact Information  Primary Emergency Contact: Cavell,[name]   [place]  Home Phone: [phone]  Work Phone: [phone]  Mobile Phone: [phone]  Relation: Mother  Secondary Emergency Contact: Golda,Jeff  Address: [address]           [place], [place] [address] [place]  Mobile Phone: [phone]  Relation: Father    Armed forces operational officer Next of Kin / Guardian / POA / Advance Directives     HCDM ([facility] policy, no known patient preference): Petron,[name] - Mother - [phone]    HCDM, back-up (If primary HCDM is unavailable): Seddon,Jeff - Father - [phone]    Advance Directive (Medical Treatment)  Does patient have an advance directive covering medical treatment?: Patient has advance directive covering medical treatment, copy not in chart.  Reason patient does not have an advance directive covering medical treatment:: Patient does not wish to complete one at this time.    Health Care Decision Maker [HCDM] (Medical & Mental Health Treatment)  Healthcare Decision Maker: HCDM documented in the HCDM/Contact Info section.  Information offered on HCDM, Medical & Mental Health advance directives:: Other (Comment)    Advance Directive (Mental Health Treatment)  Does patient have an advance directive covering mental health treatment?: Patient does not have advance directive covering mental health treatment.  Reason patient does not have an advance directive covering mental health treatment:: HCDM documented in the HCDM/Contact Info section., Patient does not wish to complete one at this time.    Readmission Information    Did the following happen with your discharge?    Patient Information  Lives with: Family members, Parent    Type of Residence: Private residence   Type of Residence: Mailing Address:  [address] [place] [address]  Contacts: Accompanied by: Family member  Patient Phone Number: [phone] (home) [phone] (work)          Medical Provider(s): Jarold Motto, [facility]  Reason for Admission: Admitting Diagnosis:  Abdominal pain, unspecified abdominal location [R10.9]  Past Medical History:   has a past medical history of Abdominal pain, Acid reflux, Anesthesia complication, Cancer (CMS-HCC), Cataract of right eye, COVID-19 virus infection (01/2019), Cyst of thyroid determined by ultrasound, [name], Difficult intravenous access, [name] (familial adenomatous polyposis), [name], Gastric polyps, History of chemotherapy, History of colon polyps, History of COVID-19 (01/2019), Iron deficiency anemia due to chronic blood loss, PONV (postoperative nausea and vomiting), Rectal bleeding, and Syncopal episodes.  Past Surgical History:   has a past surgical history that includes desmoid removal; cystis removal; Tumor removal; pr upper gi endoscopy,biopsy (N/A, 10/27/2012); pr colonoscopy w/biopsy single/multiple (N/A, 10/27/2012); pr upper gi endoscopy,biopsy (N/A, 09/14/2013); pr colonoscopy w/biopsy single/multiple (N/A, 09/14/2013); pr upper gi endoscopy,biopsy (N/A, 11/08/2014); pr colonoscopy w/biopsy single/multiple (N/A, 11/08/2014); pr upper gi endoscopy,biopsy (N/A, 12/26/2015); pr colonoscopy w/biopsy single/multiple (N/A, 12/26/2015); pr upper gi endoscopy,biopsy (N/A, 08/27/2016); pr colsc flx w/removal lesion by hot bx forceps (N/A, 08/27/2016); pr exc skin benig >4 cm face,facial (Right, 02/25/2017); pr exc skin benig 3.1-4 cm trunk,arm,leg (Right, 02/25/2017); pr exc  skin benig 2.1-3 cm trunk,arm,leg (Right, 02/25/2017); pr upper gi endoscopy,biopsy (N/A, 09/02/2017); pr colonoscopy w/biopsy single/multiple (N/A, 09/02/2017); pr colsc flx w/rmvl of tumor polyp lesion snare tq (N/A, 02/25/2019); pr exc tumor soft tissue leg/ankle subq 3+cm (Right, 08/05/2019); pr exc tumor soft tissue leg/ankle subq <3cm (Right, 08/05/2019); pr colsc flx w/rmvl of tumor polyp lesion snare tq (N/A, 03/13/2020); pr upper gi endoscopy,biopsy (N/A, 03/13/2020); cyroablation; pr lap, surg proctectomy w j-pouch (N/A, 08/10/2020); pr close enterostomy,resec+anast (N/A, 10/09/2020); pr ndsc eval intstinal pouch dx w/collj spec spx (N/A, 01/23/2021); pr unlisted procedure small intestine (01/23/2021); pr ndsc eval intstinal pouch dx w/collj spec spx (N/A, 08/27/2021); pr upper gi endoscopy,biopsy (N/A, 09/05/2021); and pr ndsc eval intstinal pouch dx w/collj spec spx (N/A, 12/09/2021).   Previous admit date: 12/06/2021    Primary Insurance- Payor: BCBS / Plan: BCBS BLUE OPTIONS/PPO/ADV ([place] ONLY) / Product Type: *No Product type* /   Secondary Insurance - None  Prescription Coverage - info on file.  [facility] - [phone] [place], [place] - [address] AT [employer]  [facility] [place]  [phone] - [place], [place] - [address]  [facility]    Transportation home: Private vehicle             Location/Detail: [address], [place], [place] [address]    Support Systems/Concerns: Case Manager/Social Worker, Family Members    Responsibilities/Dependents at home?: No    Home Care services in place prior to admission?: No  Type of Home Care services in place prior to admission: Home OT, Home PT, Home infusion  Current Home Care provider (Name/Phone #): [facility] for PT/OT,  and [facility] for past TPN  [facility] Jobe Gibbon [phone],  [facility] for TPN [phone]  Financial Information       Need for financial assistance?: No Social Determinants of Health  Social Determinants of Health     Financial Resource Strain: Low Risk  (08/30/2021)    Overall Financial Resource Strain (CARDIA)     Difficulty of Paying Living Expenses: Not very hard   Internet Connectivity: Not on file   Food Insecurity: No Food Insecurity (08/30/2021)    Hunger Vital Sign     Worried About Running Out of Food in the Last Year: Never true     Ran Out of Food in the Last Year: Never true   Tobacco Use: Low Risk  (01/10/2022)    Patient History     Smoking Tobacco Use: Never     Smokeless Tobacco Use: Never     Passive Exposure: Past   Housing/Utilities: Low Risk  (08/30/2021)    Housing/Utilities     Within the past 12 months, have you ever stayed: outside, in a car, in a tent, in an overnight shelter, or temporarily in someone else's home (i.e. couch-surfing)?: No     Are you worried about losing your housing?: No     Within the past 12 months, have you been unable to get utilities (heat, electricity) when it was really needed?: No   Alcohol Use: Not on file   Transportation Needs: No Transportation Needs (08/30/2021)    PRAPARE - Transportation     Lack of Transportation (Medical): No     Lack of Transportation (Non-Medical): No   Substance Use: Not on file   Health Literacy: Not on file   Physical Activity: Not on  file   Interpersonal Safety: Not on file   Stress: Not on file   Intimate Partner Violence: Not on file   Depression: Not on file   Social Connections: Not on file       Complex Discharge Information    Is patient identified as a difficult/complex discharge?: No       Interventions:       Discharge Needs Assessment  Concerns to be Addressed:      Clinical Risk Factors:      Barriers to taking medications: No    Prior overnight hospital stay or ED visit in last 90 days: Yes       Discharge Facility/Level of Care Needs:      Readmission  Risk of Unplanned Readmission Score: UNPLANNED READMISSION SCORE: 23.59%  Predictive Model Details          24% (High) Factor Value    Calculated 01/13/2022 12:04 24% Number of active inpatient medication orders 54    [place] Risk of Unplanned Readmission Model 13% Active NSAID inpatient medication order present     11% Number of ED visits in last six months 3     6% Number of hospitalizations in last year 2     6% Active antipsychotic inpatient medication order present     6% ECG/EKG order present in last 6 months     6% Latest calcium low (8.4 mg/dL)     5% Encounter of ten days or longer in last year present     5% Diagnosis of electrolyte disorder present     4% Imaging order present in last 6 months     4% Phosphorous result present     3% Active anticoagulant inpatient medication order present     3% Active corticosteroid inpatient medication order present     2% Current length of stay 2.9 days     1% Future appointment scheduled     1% Age 21     1% Active ulcer inpatient medication order present      Readmitted Within the Last 30 Days? (No if blank) Yes       Discharge Plan  Screen findings are: Discharge planning needs identified or anticipated (Comment).    Expected Discharge Date: 01/14/2022    Expected Transfer from Critical Care:      Quality data for continuing care services shared with patient and/or representative?: Yes  Patient and/or family were provided with choice of facilities / services that are available and appropriate to meet post hospital care needs?: Yes       Initial Assessment complete?: Yes

## 2022-01-13 NOTE — Unmapped (Signed)
Hospital Medicine Daily Progress Note    Assessment/Plan:    Principal Problem:    Intractable abdominal pain  Active Problems:    [name]    [name]    Dehydration    Severe protein-calorie malnutrition (CMS-HCC)  Resolved Problems:    * No resolved hospital problems. *                 [name] [name] is a 21 y.o. female with [name] (familial adenomatosis polyposis) s/p proctocolectomy with ileoanal anastomosis, desmoid tumors, anemia, severe protein calorie malnutrition that presented to [facility] with Intractable abdominal pain after recent celiac plexus block. She was also started on TPN this admission per prior outpatient plan.     Acute on chronic abdominal pain  Abdominal desmoid tumors  Patient went for celiac plexus block on 01/08/2022 with [name] as an outpatient and presented with acute right flank and RLQ pain. Per outpatient notes, [name] feels her acute pain response is most consistent with muscle spasms and referred pain from the spasms. CT showed small locule of gas in the left perirenal tract, likely post-procedural from block, but no renal or psoas injury or other acute findings. Patient continues to have severe pain requiring IV hydromorphone q3 hrs. Case discussed with Chronic Pain today 11/5; ketamine drip was considered but deferred given patient's report of irritability/agitation today and uncertain potential drug-drug interaction with her home sorafenib.   - Chronic Pain consulted, appreciate recs, start ketamine infusion  - Continue home regimen with scheduled tylenol 1000 mg q8h, oxycodone 10 mg q4h PRN during the day, and oral dilaudid 3 mg at bedtime PRN as tolerated.   - Hydromorphone 1 mg IV q3h PRN severe pain  - Ketorolac 15 mg IV q6h prn moderate pain  - Continue other supportive meds (diclofenac, lidocaine patch)     Severe malnutrition in setting of [name] and abdominal desmoid tumors  Patient has been on TPN twice before, administered through PICC lines which then were ultimately removed and central lines placed (at least once due to clot). She was recently discharged with a corpak and plan for tube feeds at home, but continued to lose weight and have difficulty with pain control and nausea. She was seen recently by Dr. [name] and her outpatient dietitian with plans to resume TPN and had waiting for a direct admission to do. Tunneled line placed 11/3 and she was started on TPN on 11/3. NG tube removed 11/4 due to no plans to continue tube feeds.   - Continue TPN daily. Dr. [name] has agreed to follow TPN orders/labs after discharge.    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 Awaiting PA for elidel. Not currently using clobetasol as she feels skin/scalp is acceptable right now  - Triamcinolone PRN  - Benadryl 50 mg PO q6h prn    Chronic iron deficiency  Follows with Hematology. Attributed to menstrual bleeding, impaired absorption  from abdominal disease, and chronic GI bleeding. Had infusion reaction to sodium ferric gluconate in 04/2021 requiring ED visit for steroids and Benadryl. Per outpatient heme, can consider doing re-challenge with premedications per [facility] protocol. Discussed with patient and mom 11/4, and will hold off on challenge for now until her acute pain has improved to avoid clouding the picture if she does have a reaction.  - Daily CBC  - [facility] premedication protocol for ferric gluconate challenge if pursued:   - Prednisone 50mg  at 13h, 7h, and 1h prior to infusion.  - Benadryl 50mg  at 1h prior to infusion  - Pepcid 40mg , 1h prior to infusion  - Tylenol 650mg      FEN: Regular diet by mouth for comfort/as tolerated. TPN per dietician recommendations.   VTE ppx: LMWH. Can change to Eliquis 2.5 mg BID if patient prefers (per hematology recs)  HCDM [facility] policy, no known patient preference): Merica,[name] - Mother - [phone]  HCDM, back-up (If primary HCDM is unavailable): [name] - Father - [phone]    I personally spent 50 minutes face-to-face and non-face-to-face in the care of this patient, which includes all pre, intra, and post visit time on the date of service.  All documented time was specific to the E/M visit and does not include any procedures that may have been performed.    ___________________________________________________________________    Subjective:  Shoulder pain o/n - EKG reassuring, no further c/o pain  HM IV x 7doses past 24h  Pt states plan for Chronic Pain to start ketamine, pt & mom in agreement  Pt requests another dose IV benadryl for itching around her port dressing site, agrees that erythema of yesterday has resolved    Labs/Studies:  Labs and Studies from the last 24hrs per EMR and Reviewed    Objective:  Temp:  [35.6 ??C (96.1 ??F)-36.7 ??C (98.1 ??F)] 36.7 ??C (98.1 ??F)  Heart Rate:  [70-80] 80  Resp:  [18] 18  BP: (108-121)/(63-64) 121/64  SpO2:  [97 %-98 %] 97 %  Gen: Non-toxic-appearing, sitting up in bed, mom at bedside  ENT: MMM. Tunneled line in R anterior chest wall, dressing stable  Pulm: CTAB, normal WOB  Abd: Soft, nondistended, diffuse tenderness without rebound or guarding, +BS  Ext: WWP, no edema or cyanosis  Neuro: A&O x 3, no focal deficits  Psych: Appropriate affect

## 2022-01-13 NOTE — Unmapped (Signed)
Department of Anesthesiology  Pain Medicine Division    Chronic Pain Followup Inpatient Consult Note    Requesting Attending Physician:  Arman Filter, MD  Service Requesting Consult:  Med Bernita Raisin [facility])    Assessment/Recommendations:  The patient was seen in consultation on request of Arman Filter, MD regarding assistance with pain management. The patient is not obtaining adequate pain relief on current medication regimen.      The patient is a 21 y.o. 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 She was also started on TPN this admission per prior outpatient plan.       Chronic pain team was consulted as patient is having acute on chronic pain.  Patient recently received bilateral celiac plexus blocks on November 1 as an outpatient.  Patient reported having right-sided flank and right lower quadrant pain that was thought to be muscle spasms.  Patient was sent to the ER by [name] for possible evaluation of a perforation.  Imaging in the ED was negative for any findings.  In the past patient has had a history of having significant pain flares after trigger point injections that continues for 1 day.  She has used Toradol before which does help with pain relief.  As patient has risk of GI bleeds with normal creatinine and platelets she was recommended to use try Toradol to help with the pain flare.     We discussed continuing with the current management of pain.  We also discussed that we can try starting a ketamine infusion to see if that will help with pain control. At this time, patient wants to hold off and see how current pain medications are doing. We will re-evaluate ketamine 11/6.     Interval: Patient states she is continuing to feel pain in her abdomen and right shoulder. Reports having similar pain in the past that causes her tightness, aching and sharp pain in her shoulder blade and arm. On exam, patient has multiple tender points with referring pain. Pain appears to be myofascial in origin. We re-discussed starting low dose ketamine is she is amenable and patient expressed agreement. We called pharmacy (onc pharmacist on call - [phone]) to make sure there are no drug interactions with patient's chemo and it was reported that there are not. We will start order for ketamine infusion. Patient's LFTs are within normal, negative pregnancy and normal EKG on file.     Recommendations:  -The chronic pain service is a consult service and does not place orders, just makes recommendations (except ketamine and lidocaine infusions)   -Please evaluate all patients on opioids for appropriateness of prescribing narcan at discharge.  The chronic pain service can assist with this.  Nasal narcan is covered by most insurances.  -Recommendations given apply to the current hospitalization and do not reflect long term recommendations.    -Continue Tylenol 1 g every 8 hours  -Continue baclofen 10 mg every 8 hours  -Continue gabapentin 200 every night  -Continue oxycodone 10 mg every 4 hours as needed  -Continue p.o. Dilaudid 3 mg every night as needed  -Continue lidocaine patch  -Continue IV Dilaudid every 3 hours as needed 1 mg  -Try Toradol 15 mg every 6 hours as needed with close monitoring as patient does have risk for GI bleeds  - We will start ketamine infusion    -  Patient is on a low dose ketamine infusion for pain   -All ketamine orders will be managed by the [facility] Chronic Pain Service only-orders can only be placed by attendings and fellows   -Ketamine requires dedicated IV (PIV or central line lumen)   -Start ketamine infusion: 0.05mg /kg/hr Actual body weight   -Patient location: floor (can decrease anytime)   -Day 1 (max-7 days). Started (date): 01/13/22   -Prior to starting: Recent EKG, CMP (creatinine, LFTs), urine pregnancy test (if applicable)   -Daily CMP (creatinine and LFTs daily) while running   -Lorazepam is not available PRN for agitation/hallucinations only related to ketamine infusion   -Glycopyrrolate is not available PRN for salivation   -Zofran PRN is available for nausea   -Please contact the Chronic Pain service with any questions or concerns about ketamine infusion.      We will continue to follow.    Naloxone Rx at discharge?  Is patient on opioids? Yes.  1)Is dose >50MME?  Yes.  2) Is patient prescribed a benzodiazepine (w opioids)? No.  3)Hx of overdose?  No.  4) Hx of substance use disorder? No.  5) Opioids likely to last greater than a week after discharge? Yes.     If yes to 2 or more, prescribe naloxone at discharge.  Nasal narcan for most insured (Nasal narcan 4mg /actuation, prescribe 1 kit, instructions at [email or web address]).  For uninsured, chronic pain can work to assist in finding an option.  OTC nasal narcan now available at most pharmacies for around $45.    Interim History  There were no Acute Events Overnight.  The patient is obtaining adequate pain relief on current medication regimen and feels that their pain is Somewhat controlled    Today patient reports feeling slightly better than yesterday however continues to have pain. She states she took a gas x yesterday and had a lot of gas being released however still feels like her belly is distended. She also had complaints of right shoulder pain that is described as achy, sharp and throbbing.     Analgesia Evaluation:  Pain at minimum: 5/10  Pain at maximum: 9/10    Current pain medication regimen (including how frequent PRN's were used):  -Gabapentin 200 mg at night  -Baclofen 10 mg 3 times daily  -Oxycodone 10 mg every 4 hours as needed x0  -Dilaudid 3 mg p.o. nightly   - Dilaudid 1 mg IV q3h x5  - Ketorolac 15 q6h x0        Inpatient Medications  Current Facility-Administered Medications   Medication Dose Route Frequency Provider Last Rate Last Admin    acetaminophen (TYLENOL) tablet 1,000 mg  1,000 mg Oral Q8H Kateri Plummer, MD   1,000 mg at 01/13/22 0519    baclofen (LIORESAL) tablet 10 mg  10 mg Oral Q8H Kateri Plummer, MD   10 mg at 01/13/22 0519    cefdinir (OMNICEF) capsule 300 mg  300 mg Oral BID Kateri Plummer, MD   300 mg at 01/12/22 2152    dexAMETHasone (DECADRON) 4 mg/mL injection 20 mg  20 mg Intravenous Once PRN Sanoff, Mabeline Caras, MD        diclofenac sodium (VOLTAREN) 1 % gel 2 g  2 g Topical QID PRN Kateri Plummer, MD        diphenhydrAMINE (BENADRYL) 25 mg in sodium chloride (NS) 0.9 % 25 mL IVPB  25 mg Intravenous Q6H PRN Arman Filter, MD   Stopped at 01/12/22 1910  diphenhydrAMINE (BENADRYL) capsule/tablet 50 mg  50 mg Oral Q6H PRN Arman Filter, MD        diphenhydrAMINE (BENADRYL) injection 25 mg  25 mg Intravenous Q4H PRN Sanoff, Mabeline Caras, MD        enoxaparin (LOVENOX) syringe 40 mg  40 mg Subcutaneous Q24H [facility] Arman Filter, MD   40 mg at 01/12/22 0943    EPINEPHrine (EPIPEN) injection 0.3 mg  0.3 mg Intramuscular Daily PRN Roni Bread, MD        famotidine (PEPCID) tablet 20 mg  20 mg Oral Nightly Kateri Plummer, MD   20 mg at 01/12/22 2152    Parenteral Nutrition (CENTRAL)   Intravenous Continuous Arman Filter, MD 40 mL/hr at 01/13/22 0111 New Bag at 01/13/22 0111    And    fat emulsion 20 % with fish oil (SMOFLIPID) infusion 250 mL  250 mL Intravenous Continuous Arman Filter, MD 20.8 mL/hr at 01/13/22 0111 250 mL at 01/13/22 0111    [START ON 01/14/2022] Parenteral Nutrition (CENTRAL)   Intravenous Continuous [name], MD        And    [START ON 01/14/2022] fat emulsion 20 % with fish oil (SMOFLIPID) infusion 250 mL  250 mL Intravenous Continuous [name], MD        gabapentin (NEURONTIN) capsule 200 mg  200 mg Oral Nightly Kateri Plummer, MD   200 mg at 01/12/22 2152    hydrocortisone 1 % cream   Topical BID PRN Dorise Hiss, ACNP        HYDROmorphone (DILAUDID) tablet 3 mg  3 mg Oral Nightly PRN Kateri Plummer, MD   3 mg at 01/13/22 0049    HYDROmorphone (PF) (DILAUDID) injection 1 mg  1 mg Intravenous Q3H PRN Kateri Plummer, MD   1 mg at 01/13/22 0519    hydrOXYzine (ATARAX) tablet 50 mg  50 mg Oral Q6H PRN Arman Filter, MD        hyoscyamine (LEVSIN) tablet 0.125 mg  0.125 mg Oral Q4H PRN Kateri Plummer, MD        IP OKAY TO TREAT   Other Continuous PRN Forbes Cellar, Mabeline Caras, MD        ketorolac (TORADOL) injection 15 mg  15 mg Intravenous Q6H PRN Arman Filter, MD        lidocaine 4 % patch 2 patch  2 patch Transdermal Daily Kateri Plummer, MD   2 patch at 01/12/22 0943    melatonin tablet 3 mg  3 mg Oral Nightly PRN Kateri Plummer, MD        methylPREDNISolone sodium succinate (PF) (SOLU-Medrol) injection 125 mg  125 mg Intravenous Q4H PRN Sanoff, Mabeline Caras, MD        mirtazapine (REMERON) tablet 7.5 mg  7.5 mg Oral Nightly Kateri Plummer, MD   7.5 mg at 01/12/22 2152    naloxone [facility]) injection 0.1 mg  0.1 mg Intravenous Q5 Min PRN Kateri Plummer, MD        OLANZapine zydis (ZyPREXA) disintegrating tablet 5 mg  5 mg Oral Nightly Kateri Plummer, MD   5 mg at 01/12/22 2153    ondansetron (ZOFRAN-ODT) disintegrating tablet 4 mg  4 mg Oral Q6H PRN Arman Filter, MD   4 mg at 01/13/22 0519    Or    ondansetron (ZOFRAN) injection 4 mg  4 mg Intravenous Q6H PRN Yehuda Savannah  Elexa, MD        oxyCODONE (ROXICODONE) immediate release tablet 10 mg  10 mg Oral Q4H PRN Kateri Plummer, MD        senna Mancel Parsons) tablet 2 tablet  2 tablet Oral Nightly PRN Kateri Plummer, MD        simethicone [facility]) chewable tablet 160 mg  160 mg Oral QID PRN Arman Filter, MD   160 mg at 01/12/22 1444    sodium chloride (NS) 0.9 % infusion  20 mL/hr Intravenous Continuous PRN Sanoff, Mabeline Caras, MD        sodium chloride 0.9% (NS) bolus 1,000 mL  1,000 mL Intravenous Daily PRN Sanoff, Mabeline Caras, MD        SORAfenib (NexAVAR) tablet 200 mg **patient supplied**  200 mg Oral Daily Sanoff, Mabeline Caras, MD   200 mg at 01/12/22 0945    triamcinolone (KENALOG) 0.1 % ointment   Topical BID PRN Kateri Plummer, MD             Objective:     Vital Signs    Temp:  [35.6 ??C (96.1 ??F)-36.7 ??C (98.1 ??F)] 36.7 ??C (98.1 ??F)  Heart Rate:  [70-80] 80  Resp:  [18] 18  BP: (108-121)/(63-64) 121/64  MAP (mmHg):  [79-85] 85  SpO2:  [97 %-98 %] 97 %      Physical Exam    GENERAL:  Well developed, well-nourished female and is in no apparent distress.   HEAD/NECK:    Reveals normocephalic/atraumatic.   CARDIOVASCULAR:   Warm, well perfused  LUNGS:   Normal work of breathing  EXTREMITIES:  Warm, no clubbing, cyanosis, or edema was noted. +Tender points along right shoulder blade  ABDOMEN:  Distended, tender to palpation   NEUROLOGIC:    The patient was alert and oriented times four with normal language, attention, cognition and memory. Cranial nerve exam was grossly normal.  Sensation Intact to light touch throughout the bilateral upper and lower extremities.  MUSCULOSKELETAL:    Motor function  5/5 in upper and lower extremities.    SKIN:  No obvious rashes lesions or erythema  PSY:  Appropriate affect and mood.    Test Results    Lab Results   Component Value Date    CREATININE 0.37 (L) 01/13/2022     Lab Results   Component Value Date    ALKPHOS 52 01/13/2022    BILITOT 0.2 (L) 01/13/2022    BILIDIR 0.20 12/04/2021    PROT 5.7 01/13/2022    ALBUMIN 3.0 (L) 01/13/2022    ALT 12 01/13/2022    AST 13 01/13/2022           Problem List    Principal Problem:    Intractable abdominal pain  Active Problems:    [name]    [name]    Dehydration    Severe protein-calorie malnutrition (CMS-HCC)

## 2022-01-13 NOTE — Unmapped (Signed)
Adolescent and Young Adult Cancer Program Visit     Service date: January 13, 2022    Encounter location: Virtual: Video    Clinician: [name], LCSW - AYA Clinical Social Worker    Patient identifiers: [name] [name] is a 21 y.o. with desmoid tumors. [name] [name] uses she/her pronouns.     Visit Summary   *non-billed video visit for patient admitted to hospital; AYA team follows patients inpatient and outpatient and utilizes telehealth as needed to facilitate patient care*    Met with the patient for scheduled supportive counseling.    [name] admitted to hospital this past Friday morning. Provided supportive counseling around challenges of the past several days, and the loss of her grandfather.     Follow Up/Plan:     Will see inpatient later this week.    I will continue to follow this patient for AYA-appropriate support. They have my contact information and have been encouraged to contact me as needed.     01/13/2022     [name], LCSW  Adolescent and Young Adult Clinical Social Worker  Phone: [phone]  Email: [email or web address]

## 2022-01-13 NOTE — Unmapped (Signed)
Patient A&O x4. Up ad lib. IV dilaudidx2. Oral dilaudid x1. Zofran x1. No acute events overnight  Problem: Adult Inpatient Plan of Care  Goal: Plan of Care Review  Outcome: Progressing  Goal: Patient-Specific Goal (Individualized)  Outcome: Progressing  Goal: Absence of Hospital-Acquired Illness or Injury  Outcome: Progressing  Intervention: Identify and Manage Fall Risk  Recent Flowsheet Documentation  Taken 01/13/2022 0600 by Erich Montane, RN  Safety Interventions:   fall reduction program maintained   low bed  Taken 01/13/2022 0400 by Erich Montane, RN  Safety Interventions:   fall reduction program maintained   family at bedside   low bed  Taken 01/13/2022 0200 by Erich Montane, RN  Safety Interventions:   fall reduction program maintained   family at bedside   low bed  Taken 01/13/2022 0000 by Erich Montane, RN  Safety Interventions:   fall reduction program maintained   family at bedside   low bed  Taken 01/12/2022 2200 by Erich Montane, RN  Safety Interventions:   fall reduction program maintained   family at bedside   low bed  Taken 01/12/2022 2000 by Erich Montane, RN  Safety Interventions:   fall reduction program maintained   family at bedside   low bed  Taken 01/12/2022 1957 by Erich Montane, RN  Safety Interventions:   fall reduction program maintained   family at bedside   low bed  Intervention: Prevent Skin Injury  Recent Flowsheet Documentation  Taken 01/12/2022 1957 by Erich Montane, RN  Positioning for Skin: (self turn) Other (Comment)  Goal: Optimal Comfort and Wellbeing  Outcome: Progressing  Goal: Readiness for Transition of Care  Outcome: Progressing  Goal: Rounds/Family Conference  Outcome: Progressing     Problem: Fall Injury Risk  Goal: Absence of Fall and Fall-Related Injury  Outcome: Progressing  Intervention: Promote Injury-Free Environment  Recent Flowsheet Documentation  Taken 01/13/2022 0600 by Erich Montane, RN  Safety Interventions:   fall reduction program maintained   low bed  Taken 01/13/2022 0400 by Erich Montane, RN  Safety Interventions:   fall reduction program maintained   family at bedside   low bed  Taken 01/13/2022 0200 by Erich Montane, RN  Safety Interventions:   fall reduction program maintained   family at bedside   low bed  Taken 01/13/2022 0000 by Erich Montane, RN  Safety Interventions:   fall reduction program maintained   family at bedside   low bed  Taken 01/12/2022 2200 by Erich Montane, RN  Safety Interventions:   fall reduction program maintained   family at bedside   low bed  Taken 01/12/2022 2000 by Erich Montane, RN  Safety Interventions:   fall reduction program maintained   family at bedside   low bed  Taken 01/12/2022 1957 by Erich Montane, RN  Safety Interventions:   fall reduction program maintained   family at bedside   low bed     Problem: Malnutrition  Goal: Improved Nutritional Intake  Outcome: Progressing     Problem: Self-Care Deficit  Goal: Improved Ability to Complete Activities of Daily Living  Outcome: Progressing     Problem: Latex Allergy  Goal: Absence of Allergy Symptoms  Outcome: Progressing

## 2022-01-13 NOTE — Unmapped (Signed)
Hospital Medicine Daily Progress Note    Assessment/Plan:    Principal Problem:    Intractable abdominal pain  Active Problems:    [name]    [name]    Dehydration    Severe protein-calorie malnutrition (CMS-HCC)  Resolved Problems:    * No resolved hospital problems. *                 [name] [name] is a 21 y.o. 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 She was also started on TPN this admission per prior outpatient plan.     Acute on chronic abdominal pain  Abdominal desmoid tumors  Patient went for celiac plexus block on 01/08/2022 with [name] as an outpatient and presented with acute right flank and RLQ pain. Per outpatient notes, [name] feels her acute pain response is most consistent with muscle spasms and referred pain from the spasms. CT showed small locule of gas in the left perirenal tract, likely post-procedural from block, but no renal or psoas injury or other acute findings. Patient continues to have severe pain requiring IV hydromorphone q3 hrs. Case discussed with Chronic Pain today 11/5; ketamine drip was considered but deferred given patient's report of irritability/agitation today and uncertain potential drug-drug interaction with her home sorafenib.   - Chronic Pain consulted, appreciate recs  - Continue home regimen with scheduled tylenol 1000 mg q8h, oxycodone 10 mg q4h PRN during the day, and oral dilaudid 3 mg at bedtime PRN as tolerated.   - Hydromorphone 1 mg IV q3h PRN severe pain  - Ketorolac 15 mg IV q6h prn moderate pain  - Continue other supportive meds (diclofenac, lidocaine patch)     Severe malnutrition in setting of [name] and abdominal desmoid tumors  Patient has been on TPN twice before, administered through PICC lines which then were ultimately removed and central lines placed (at least once due to clot). She was recently discharged with a corpak and plan for tube feeds at home, but continued to lose weight and have difficulty with pain control and nausea. She was seen recently by Dr. [name] and her outpatient dietitian with plans to resume TPN and had waiting for a direct admission to do. Tunneled line placed 11/3 and she was started on TPN on 11/3. NG tube removed 11/4 due to no plans to continue tube feeds.   - Continue TPN daily. Dr. [name] has agreed to follow TPN orders/labs after discharge.    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 Awaiting PA for elidel. Not currently using clobetasol as she feels skin/scalp is acceptable right now  - Triamcinolone PRN  - Benadryl 25 mg IVPB infused slowly q6h prn itching for 24 hrs, or Benadryl 50 mg PO q6h prn    Chronic iron deficiency  Follows with Hematology. Attributed to menstrual bleeding, impaired absorption from abdominal  disease, and chronic GI bleeding. Had infusion reaction to sodium ferric gluconate in 04/2021 requiring ED visit for steroids and Benadryl. Per outpatient heme, can consider doing re-challenge with premedications per [facility] protocol. Discussed with patient and mom 11/4, and will hold off on challenge for now until her acute pain has improved to avoid clouding the picture if she does have a reaction.  - Daily CBC  - [facility] premedication protocol for ferric gluconate challenge if pursued:   - Prednisone 50mg  at 13h, 7h, and 1h prior to infusion.  - Benadryl 50mg  at 1h prior to infusion  - Pepcid 40mg , 1h prior to infusion  - Tylenol 650mg      FEN: Regular diet by mouth for comfort/as tolerated. TPN per dietician recommendations.   VTE ppx: LMWH. Can change to Eliquis 2.5 mg BID if patient prefers (per hematology recs)  HCDM [facility] policy, no known patient preference): Bennis,[name] - Mother - [phone]  HCDM, back-up (If primary HCDM is unavailable): [name] - Father - [phone]    I personally spent 50 minutes face-to-face and non-face-to-face in the care of this patient, which includes all pre, intra, and post visit time on the date of service.  All documented time was specific to the E/M visit and does not include any procedures that may have been performed.    ___________________________________________________________________    Subjective:  No acute events overnight. Feels very agitated today (on clarification, feels irritable, short-tempered, over-stimulated due to pain and multiple recent invasive interventions). Still having lots of pain in her RLQ and tunneled line insertion site. Has required HM 1 mg IV x 8 in last 24 hrs and has asked for it early, before 3 hrs, x 2. Ate few small bites of a pancake and eggs. Abdomen more distended today. Still having multiple watery BMs. No emesis, but says she never throws up even when she's very nauseated. Ambulated on unit last night with her boyfriend and needed his assistance to get back to the room due to pain. This morning, I watched her ambulate with her dad from her room to the nutrition room about 20 ft away and back slowly, but without needing assistance.     Labs/Studies:  Labs and Studies from the last 24hrs per EMR and Reviewed    Objective:  Temp:  [35.6 ??C (96.1 ??F)-35.9 ??C (96.7 ??F)] 35.6 ??C (96.1 ??F)  Heart Rate:  [76-82] 76  Resp:  [18-20] 18  BP: (113-127)/(62-70) 116/63  SpO2:  [97 %-98 %] 97 %    Gen: Non-toxic-appearing, sitting up in bed, mom and dad at bedside  Eyes: EOMI, conjunctivae clear  ENT: MMM. Interval removal of Corpak. Tunneled line in R anterior chest wall, insertion site with small amount of dried blood under sterile dressing.   Pulm: nl WOB on RA  Abd: Soft, mildly distended, diffuse tenderness without rebound or guarding, +BS  Ext: WWP, no edema or cyanosis  Neuro: A&O x 3, no focal deficits  Psych: Appropriate affect

## 2022-01-13 NOTE — Unmapped (Addendum)
[name] [name] is a 21 y.o. 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 She was also started on TPN this admission per prior outpatient plan and her hospital course was complicated by GI bleed.     Acute on chronic abdominal pain.   Patient underwent celiac plexus block on 01/08/2022 with [name] as an outpatient and presented with acute right flank and RLQ pain. Per outpatient notes, [name] feels her acute pain response is most consistent with muscle spasms and referred pain from the spasms. CT showed small locule of gas in the left perirenal tract, likely post-procedural from block, but no renal or psoas injury or other acute findings. Patient continued to have severe pain requiring IV hydromorphone q3 hrs. Chronic Pain consulted and provided ketamine drip after careful risk/benefit discussion w/ pt. IV hydromorphone weaned off with full 7-day maximum Ketamine course. Home oxycodone switched to oral hydromorphone w/ good relief of chronic pain. Provided home regimen with scheduled tylenol 1000 mg q8h, diclofenac, lidocaine patch, increased baclofen dose.     Severe malnutrition in setting of [name] and abdominal desmoid tumors  Patient has been on TPN twice before, administered through PICC lines which then were ultimately removed and central lines placed (at least once due to clot). She was recently discharged with a Corpak and plan for tube feeds at home, but continued to lose weight and have difficulty with pain control and nausea. She was seen recently by Dr. [name] and her outpatient dietitian with plans for direct admission to resume TPN. We opted to pursue this during current admission. Tunneled line placed 11/3 and she was started on TPN on 11/3. NG tube removed 11/4 due to no plans to continue tube feeds. She will need to continue VTE prophylaxis while central line is in place, although this was complicated by development of GI bleed on anticoag ppx.       Chronic iron deficiency. Acute on chronic blood losses  Follows with Hematology. Attributed to menstrual bleeding, impaired absorption from abdominal disease, and chronic GI bleeding. Had infusion reaction to sodium ferric gluconate in 04/2021 requiring ED visit for steroids and benadryl. Ferritin 13, fe 33, fe sat 12% and ongoing GI bleeding (on anticoag) w/ drop in hgb 11-->8 range. Provided IV iron repletion, x6 doses given ongoing losses. Used [facility] premedication protocol for ferric gluconate and pt tolerated all doses well. After first dose provided ongoing 1-hr pre infusion meds (pred, benadryl, pepcid, acetamin) for all subsequent infusions. GI performed EGD/pouchoscopy Mon 11/13 with findings of ***  Pt was monitored during restart of anticoag ***with/without recurrence of bleeding issues.     Hx line associated DVT. Follows w/ Heme w/ recs for ppx w/ enox 40 or eliquis 2.5 bid when pt has line in place.     [name]/Desmoid tumors. Continued sorafenib with Benadryl as needed for itching.   Anxiety. 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 Provided mucositis mixture and ammonium for hydration and symptomatic relief.

## 2022-01-14 ENCOUNTER — Ambulatory Visit: Admit: 2022-01-14 | Payer: PRIVATE HEALTH INSURANCE

## 2022-01-14 LAB — CBC
HEMATOCRIT: 34.5 % (ref 34.0–44.0)
HEMOGLOBIN: 11.7 g/dL (ref 11.3–14.9)
MEAN CORPUSCULAR HEMOGLOBIN CONC: 34.1 g/dL (ref 32.0–36.0)
MEAN CORPUSCULAR HEMOGLOBIN: 29.5 pg (ref 25.9–32.4)
MEAN CORPUSCULAR VOLUME: 86.5 fL (ref 77.6–95.7)
MEAN PLATELET VOLUME: 10.5 fL (ref 6.8–10.7)
PLATELET COUNT: 184 10*9/L (ref 150–450)
RED BLOOD CELL COUNT: 3.98 10*12/L (ref 3.95–5.13)
RED CELL DISTRIBUTION WIDTH: 12.2 % (ref 12.2–15.2)
WBC ADJUSTED: 6.6 10*9/L (ref 3.6–11.2)

## 2022-01-14 LAB — COMPREHENSIVE METABOLIC PANEL
ALBUMIN: 3.3 g/dL — ABNORMAL LOW (ref 3.4–5.0)
ALKALINE PHOSPHATASE: 48 U/L (ref 46–116)
ALT (SGPT): 17 U/L (ref 10–49)
ANION GAP: 7 mmol/L (ref 5–14)
AST (SGOT): 8 U/L (ref <=34)
BILIRUBIN TOTAL: 0.3 mg/dL (ref 0.3–1.2)
BLOOD UREA NITROGEN: 15 mg/dL (ref 9–23)
BUN / CREAT RATIO: 41
CALCIUM: 9 mg/dL (ref 8.7–10.4)
CHLORIDE: 108 mmol/L — ABNORMAL HIGH (ref 98–107)
CO2: 28 mmol/L (ref 20.0–31.0)
CREATININE: 0.37 mg/dL — ABNORMAL LOW
EGFR CKD-EPI (2021) FEMALE: >90 mL/min/{1.73_m2} (ref >=60)
GLUCOSE RANDOM: 81 mg/dL (ref 70–179)
POTASSIUM: 3.8 mmol/L (ref 3.4–4.8)
PROTEIN TOTAL: 6.3 g/dL (ref 5.7–8.2)
SODIUM: 143 mmol/L (ref 135–145)

## 2022-01-14 LAB — PHOSPHORUS: PHOSPHORUS: 3.2 mg/dL (ref 2.4–5.1)

## 2022-01-14 LAB — MAGNESIUM: MAGNESIUM: 2.1 mg/dL (ref 1.6–2.6)

## 2022-01-14 MED ADMIN — acetaminophen (TYLENOL) tablet 1,000 mg: 1000 mg | ORAL | @ 05:00:00

## 2022-01-14 MED ADMIN — HYDROmorphone (PF) (DILAUDID) injection 1 mg: 1 mg | INTRAVENOUS | @ 15:00:00 | Stop: 2022-01-19

## 2022-01-14 MED ADMIN — cefdinir (OMNICEF) capsule 300 mg: 300 mg | ORAL | @ 15:00:00 | Stop: 2022-02-09

## 2022-01-14 MED ADMIN — triamcinolone (KENALOG) 0.1 % ointment: TOPICAL | @ 05:00:00

## 2022-01-14 MED ADMIN — HYDROmorphone (PF) (DILAUDID) injection 1 mg: 1 mg | INTRAVENOUS | @ 18:00:00 | Stop: 2022-01-19

## 2022-01-14 MED ADMIN — ondansetron (ZOFRAN) injection 4 mg: 4 mg | INTRAVENOUS | @ 11:00:00

## 2022-01-14 MED ADMIN — cefdinir (OMNICEF) capsule 300 mg: 300 mg | ORAL | @ 01:00:00 | Stop: 2022-02-09

## 2022-01-14 MED ADMIN — famotidine (PEPCID) tablet 20 mg: 20 mg | ORAL | @ 01:00:00

## 2022-01-14 MED ADMIN — SORAfenib (NexAVAR) tablet 200 mg **patient supplied**: 200 mg | ORAL | @ 15:00:00

## 2022-01-14 MED ADMIN — baclofen (LIORESAL) tablet 10 mg: 10 mg | ORAL | @ 05:00:00

## 2022-01-14 MED ADMIN — HYDROmorphone (PF) (DILAUDID) injection 1 mg: 1 mg | INTRAVENOUS | @ 01:00:00 | Stop: 2022-01-15

## 2022-01-14 MED ADMIN — ondansetron (ZOFRAN) injection 4 mg: 4 mg | INTRAVENOUS | @ 19:00:00

## 2022-01-14 MED ADMIN — HYDROmorphone (PF) (DILAUDID) injection 1 mg: 1 mg | INTRAVENOUS | @ 05:00:00 | Stop: 2022-01-15

## 2022-01-14 MED ADMIN — triamcinolone (KENALOG) 0.1 % ointment: TOPICAL | @ 22:00:00

## 2022-01-14 MED ADMIN — OLANZapine zydis (ZyPREXA) disintegrating tablet 5 mg: 5 mg | ORAL | @ 04:00:00

## 2022-01-14 MED ADMIN — HYDROmorphone (PF) (DILAUDID) injection 1 mg: 1 mg | INTRAVENOUS | @ 11:00:00 | Stop: 2022-01-19

## 2022-01-14 MED ADMIN — acetaminophen (TYLENOL) tablet 1,000 mg: 1000 mg | ORAL | @ 18:00:00

## 2022-01-14 MED ADMIN — baclofen (LIORESAL) tablet 10 mg: 10 mg | ORAL | @ 11:00:00

## 2022-01-14 MED ADMIN — HYDROmorphone (PF) (DILAUDID) injection 1 mg: 1 mg | INTRAVENOUS | @ 22:00:00 | Stop: 2022-01-19

## 2022-01-14 MED ADMIN — gabapentin (NEURONTIN) capsule 200 mg: 200 mg | ORAL | @ 04:00:00

## 2022-01-14 MED ADMIN — enoxaparin (LOVENOX) syringe 40 mg: 40 mg | SUBCUTANEOUS | @ 15:00:00

## 2022-01-14 MED ADMIN — mirtazapine (REMERON) tablet 7.5 mg: 7.5 mg | ORAL | @ 04:00:00

## 2022-01-14 MED ADMIN — baclofen (LIORESAL) tablet 10 mg: 10 mg | ORAL | @ 18:00:00

## 2022-01-14 MED ADMIN — lidocaine 4 % patch 2 patch: 2 | TRANSDERMAL | @ 15:00:00

## 2022-01-14 MED ADMIN — acetaminophen (TYLENOL) tablet 1,000 mg: 1000 mg | ORAL | @ 11:00:00

## 2022-01-14 NOTE — Unmapped (Signed)
Department of Anesthesiology  Pain Medicine Division    Chronic Pain Followup Inpatient Consult Note    Requesting Attending Physician:  [name], MD  Service Requesting Consult:  Med Bernita Raisin [facility])    Assessment/Recommendations:  The patient was seen in consultation on request of Arman Filter, MD regarding assistance with pain management. The patient is not obtaining adequate pain relief on current medication regimen.      The patient is a 21 y.o. 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 She was also started on TPN this admission per prior outpatient plan.       Chronic pain team was consulted as patient is having acute on chronic pain.  Patient recently received bilateral celiac plexus blocks on November 1 as an outpatient.  Patient reported having right-sided flank and right lower quadrant pain that was thought to be muscle spasms.  Patient was sent to the ER by [name] for possible evaluation of a perforation.  Imaging in the ED was negative for any findings.  In the past patient has had a history of having significant pain flares after trigger point injections that continues for 1 day.  She has used Toradol before which does help with pain relief.  As patient has risk of GI bleeds with normal creatinine and platelets she was recommended to use try Toradol to help with the pain flare.     We discussed continuing with the current management of pain.  We also discussed that we can try starting a ketamine infusion to see if that will help with pain control. At this time, patient wants to hold off and see how current pain medications are doing. We will re-evaluate ketamine 11/6.     Interval: Patient states she finally slept for 6 hours for the first time in a long time. She reports feeling better however still does have pain flares and needs to use IV dilaudid. Patient did have complaint of slightly fuzzy vision when looking at her phone however patient's mother reported that she was not wearing her contact and has a cataract in her eye so could be due to that. We discussed that this could be a side effect from the ketamine so we can try to increase the ketamine however will have a low threshold to go back to 0.05 dosing or turn it off if side effects are increasing. We also discussed utilizing oxycodone to help reduce her pain and use IV dilaudid as a rescue therapy. Patient expressed understanding.     Recommendations:  -The chronic pain service is a consult service and does not place orders, just makes recommendations (except ketamine and lidocaine infusions)   -Please evaluate all patients on opioids for appropriateness of prescribing narcan at discharge.  The chronic pain service can assist with this.  Nasal narcan is covered by most insurances.  -Recommendations given apply to the current hospitalization and do not reflect long term recommendations.    -Continue Tylenol 1 g every 8 hours  -Continue baclofen 10 mg every 8 hours  -Continue gabapentin 200 every night  -Continue oxycodone 10 mg every 4 hours as needed  -Continue p.o. Dilaudid 3 mg every night as needed  -Continue lidocaine patch  -Continue IV Dilaudid every 3 hours as needed 1 mg  -Try Toradol 15 mg every 6 hours as needed with close  monitoring as patient does have risk for GI bleeds  - Continue ketamine infusion, increase today to 0.07 from 0.05         Ketamine Infusion Order                    Frequency     ketamine 600 mg in 60 mL (10 mg/mL) infusion        Question Answer Comment   Titrate Medication? Do not Titrate    Call MD if arrhythmias occur        Continuous        -Patient is on a low dose ketamine infusion for pain   -All ketamine orders will be managed by the [facility] Chronic Pain Service only-orders can only be placed by attendings and fellows   -Ketamine requires dedicated IV (PIV or central line lumen)   -Start ketamine infusion: 0.07mg /kg/hr Actual body weight   -Patient location: floor (can decrease anytime)   -Day 2 (max-7 days). Started (date): 01/13/22   -Prior to starting: Recent EKG, CMP (creatinine, LFTs), urine pregnancy test (if applicable)   -Daily CMP (creatinine and LFTs daily) while running   -Lorazepam is not available PRN for agitation/hallucinations only related to ketamine infusion   -Glycopyrrolate is not available PRN for salivation   -Zofran PRN is available for nausea   -Please contact the Chronic Pain service with any questions or concerns about ketamine infusion.      We will continue to follow.    Naloxone Rx at discharge?  Is patient on opioids? Yes.  1)Is dose >50MME?  Yes.  2) Is patient prescribed a benzodiazepine (w opioids)? No.  3)Hx of overdose?  No.  4) Hx of substance use disorder? No.  5) Opioids likely to last greater than a week after discharge? Yes.     If yes to 2 or more, prescribe naloxone at discharge.  Nasal narcan for most insured (Nasal narcan 4mg /actuation, prescribe 1 kit, instructions at [email or web address]).  For uninsured, chronic pain can work to assist in finding an option.  OTC nasal narcan now available at most pharmacies for around $45.    Interim History  There were no Acute Events Overnight.  The patient is obtaining adequate pain relief on current medication regimen and feels that their pain is Somewhat controlled    Today patient states she was able to sleep overnight for the first time in a while. She also reports feeling less abdominal pain and distension compared to yesterday. Patient states she felt like her vision was fuzzy when looking at her phone however denied having double vision or trouble seeing. She said she was not wearing her contact though so could be from that as well.     Analgesia Evaluation:  Pain at minimum: 5/10  Pain at maximum: 9/10    Current pain medication regimen (including how frequent PRN's were used):  -Gabapentin 200 mg at night  -Baclofen 10 mg 3 times daily  -Oxycodone 10 mg every 4 hours as needed x0  -Dilaudid 3 mg p.o. nightly   - Dilaudid 1 mg IV q3h x6  - Ketorolac 15 q6h x0  - Ketamine gtt        Inpatient Medications  Current Facility-Administered Medications   Medication Dose Route Frequency Provider Last Rate Last Admin    acetaminophen (TYLENOL) tablet 1,000 mg  1,000 mg Oral Q8H Kateri Plummer, MD   1,000 mg at 01/14/22 0627    baclofen (LIORESAL) tablet 10 mg  10 mg Oral Q8H Kateri Plummer, MD   10 mg at 01/14/22 1610    cefdinir (OMNICEF) capsule 300 mg  300 mg Oral BID Kateri Plummer, MD   300 mg at 01/13/22 2018    dexAMETHasone (DECADRON) 4 mg/mL injection 20 mg  20 mg Intravenous Once PRN Sanoff, Mabeline Caras, MD        diclofenac sodium (VOLTAREN) 1 % gel 2 g  2 g Topical QID PRN Kateri Plummer, MD        diphenhydrAMINE (BENADRYL) capsule/tablet 50 mg  50 mg Oral Q6H PRN Arman Filter, MD        diphenhydrAMINE (BENADRYL) injection 25 mg  25 mg Intravenous Q4H PRN Sanoff, Mabeline Caras, MD        enoxaparin (LOVENOX) syringe 40 mg  40 mg Subcutaneous Q24H [facility] Arman Filter, MD   40 mg at 01/13/22 0958    EPINEPHrine (EPIPEN) injection 0.3 mg  0.3 mg Intramuscular Daily PRN Roni Bread, MD        famotidine (PEPCID) tablet 20 mg  20 mg Oral Nightly Kateri Plummer, MD   20 mg at 01/13/22 2018    Parenteral Nutrition (CENTRAL)   Intravenous Continuous [name], MD 80 mL/hr at 01/13/22 2358 New Bag at 01/13/22 2358    And    fat emulsion 20 % with fish oil (SMOFLIPID) infusion 250 mL  250 mL Intravenous Continuous [name], MD 20.8 mL/hr at 01/13/22 2358 250 mL at 01/13/22 2358    gabapentin (NEURONTIN) capsule 200 mg  200 mg Oral Nightly Kateri Plummer, MD   200 mg at 01/13/22 2302    hydrocortisone 1 % cream   Topical BID PRN Dorise Hiss, ACNP        HYDROmorphone (DILAUDID) tablet 3 mg  3 mg Oral Nightly PRN Kateri Plummer, MD   3 mg at 01/13/22 0049    HYDROmorphone (PF) (DILAUDID) injection 1 mg  1 mg Intravenous Q3H PRN Kateri Plummer, MD   1 mg at 01/14/22 9604    hydrOXYzine (ATARAX) tablet 50 mg  50 mg Oral Q6H PRN Arman Filter, MD        hyoscyamine (LEVSIN) tablet 0.125 mg  0.125 mg Oral Q4H PRN Kateri Plummer, MD        IP OKAY TO TREAT   Other Continuous PRN Sanoff, Mabeline Caras, MD        ketamine 600 mg in 60 mL (10 mg/mL) infusion  0.05 mg/kg/hr Intravenous Continuous Gilmore Laroche, MD 0.3 mL/hr at 01/14/22 0700 0.05 mg/kg/hr at 01/14/22 0700    ketorolac (TORADOL) injection 15 mg  15 mg Intravenous Q6H PRN Arman Filter, MD        lidocaine 4 % patch 2 patch  2 patch Transdermal Daily Kateri Plummer, MD   2 patch at 01/13/22 0958    melatonin tablet 3 mg  3 mg Oral Nightly PRN Kateri Plummer, MD        methylPREDNISolone sodium succinate (PF) (SOLU-Medrol) injection 125 mg  125 mg Intravenous Q4H PRN Sanoff, Mabeline Caras, MD        mirtazapine (REMERON) tablet 7.5 mg  7.5 mg Oral Nightly Kateri Plummer, MD   7.5 mg at 01/13/22 2302    naloxone (NARCAN) injection 0.1 mg  0.1 mg Intravenous Q5 Min PRN Kateri Plummer, MD        OLANZapine zydis (  ZyPREXA) disintegrating tablet 5 mg  5 mg Oral Nightly Kateri Plummer, MD   5 mg at 01/13/22 2302    ondansetron (ZOFRAN-ODT) disintegrating tablet 4 mg  4 mg Oral Q6H PRN Arman Filter, MD   4 mg at 01/13/22 1648    Or    ondansetron (ZOFRAN) injection 4 mg  4 mg Intravenous Q6H PRN Arman Filter, MD   4 mg at 01/14/22 4782    oxyCODONE (ROXICODONE) immediate release tablet 10 mg  10 mg Oral Q4H PRN Kateri Plummer, MD        senna Mancel Parsons) tablet 2 tablet  2 tablet Oral Nightly PRN Kateri Plummer, MD        simethicone [facility]) chewable tablet 160 mg  160 mg Oral QID PRN Arman Filter, MD   160 mg at 01/13/22 0958    sodium chloride (NS) 0.9 % infusion  20 mL/hr Intravenous Continuous PRN Sanoff, Mabeline Caras, MD        sodium chloride (NS) 0.9 % infusion  10 mL/hr Intravenous Continuous Gilmore Laroche, MD 10 mL/hr at 01/13/22 1342 10 mL/hr at 01/13/22 1342    sodium chloride 0.9% (NS) bolus 1,000 mL  1,000 mL Intravenous Daily PRN Sanoff, Mabeline Caras, MD        SORAfenib (NexAVAR) tablet 200 mg **patient supplied**  200 mg Oral Daily Sanoff, Mabeline Caras, MD   200 mg at 01/13/22 0958    triamcinolone (KENALOG) 0.1 % ointment   Topical BID PRN Kateri Plummer, MD   1 application. at 01/13/22 2352         Objective:     Vital Signs    Temp:  [35.6 ??C (96.1 ??F)-36.5 ??C (97.7 ??F)] 36.5 ??C (97.7 ??F)  Heart Rate:  [72-96] 72  Resp:  [17-18] 18  BP: (112-126)/(57-72) 112/68  MAP (mmHg):  [79-94] 84  SpO2:  [97 %-98 %] 97 %      Physical Exam    GENERAL:  Well developed, well-nourished female and is in no apparent distress.   HEAD/NECK:    Reveals normocephalic/atraumatic.   CARDIOVASCULAR:   Warm, well perfused  LUNGS:   Normal work of breathing  EXTREMITIES:  Warm, no clubbing, cyanosis, or edema was noted. +Tender points along right shoulder blade  ABDOMEN:  Distended, tender to palpation   NEUROLOGIC:    The patient was alert and oriented times four with normal language, attention, cognition and memory. Cranial nerve exam was grossly normal.  Sensation Intact to light touch throughout the bilateral upper and lower extremities.  MUSCULOSKELETAL:    Motor function  5/5 in upper and lower extremities.    SKIN:  No obvious rashes lesions or erythema  PSY:  Appropriate affect and mood.    Test Results    Lab Results   Component Value Date    CREATININE 0.37 (L) 01/13/2022     Lab Results   Component Value Date    ALKPHOS 52 01/13/2022    BILITOT 0.2 (L) 01/13/2022    BILIDIR 0.20 12/04/2021    PROT 5.7 01/13/2022    ALBUMIN 3.0 (L) 01/13/2022    ALT 12 01/13/2022    AST 13 01/13/2022           Problem List    Principal Problem:    Intractable abdominal pain  Active Problems:    [name]    [name]    Dehydration    Severe protein-calorie malnutrition (CMS-HCC)

## 2022-01-14 NOTE — Unmapped (Signed)
Hospital Medicine Daily Progress Note    Assessment/Plan:    Principal Problem:    Intractable abdominal pain  Active Problems:    [name]    [name]    Dehydration    Severe protein-calorie malnutrition (CMS-HCC)  Resolved Problems:    * No resolved hospital problems. *                 [name] [name] is a 21 y.o. 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 She was also started on TPN this admission per prior outpatient plan.     Acute on chronic abdominal pain  Abdominal desmoid tumors  Patient went for celiac plexus block on 01/08/2022 with [name] as an outpatient and presented with acute right flank and RLQ pain. Per outpatient notes, [name] feels her acute pain response is most consistent with muscle spasms and referred pain from the spasms. CT showed small locule of gas in the left perirenal tract, likely post-procedural from block, but no renal or psoas injury or other acute findings. Patient continues to have severe pain requiring IV hydromorphone q3 hrs. Case discussed with Chronic Pain today 11/5; ketamine drip was considered but deferred given patient's report of irritability/agitation today and uncertain potential drug-drug interaction with her home sorafenib.   - Chronic Pain consulted, appreciate recs, day 2 of ketamine infusion  - Continue home regimen with scheduled tylenol 1000 mg q8h, oxycodone 10 mg q4h PRN during the day, and oral dilaudid 3 mg at bedtime PRN as tolerated.   - Hydromorphone 1 mg IV q3h PRN severe pain, ongoing use of this  - Ketorolac 15 mg IV q6h prn moderate pain  - Continue other supportive meds (diclofenac, lidocaine patch)     Severe malnutrition in setting of [name] and abdominal desmoid tumors  Patient has been on TPN twice before, administered through PICC lines which then were ultimately removed and central lines placed (at least once due to clot). She was recently discharged with a corpak and plan for tube feeds at home, but continued to lose weight and have difficulty with pain control and nausea. She was seen recently by Dr. [name] and her outpatient dietitian with plans to resume TPN and had waiting for a direct admission to do. Tunneled line placed 11/3 and she was started on TPN on 11/3. NG tube removed 11/4 due to no plans to continue tube feeds.   - Continue TPN daily. Dr. [name] has agreed to follow TPN orders/labs after discharge.    - Appreciate CM assistance with referral for home infusion  - TPN cycling to 12 hrs overnight per dietician  - Pharmacologic VTE ppx per Heme recs while central line in place     [name]/Desmoid tumors  - Continue sorafenib daily (patient-supplied) per Oncology     Anxiety   - Continue zyprexa 2.5qhs  - Atarax 25 q6prn  - Remeron 7.5 at bedtime     Eczematous Dermatitis secondary to sorafenib for [name]: recurring with re-initiation of sorafenib  Irritant contact dermatitis to adhesive of Duoderm on cheek with NG  Saw dermatology as outpatient. Awaiting PA for elidel. Not currently using clobetasol as she feels skin/scalp is acceptable right now  - Triamcinolone PRN  - Benadryl 50 mg PO q6h prn    Chronic iron deficiency  Follows with Hematology. Attributed to  menstrual bleeding, impaired absorption from abdominal disease, and chronic GI bleeding. Had infusion reaction to sodium ferric gluconate in 04/2021 requiring ED visit for steroids and Benadryl. Per outpatient heme, can consider doing re-challenge with premedications per [facility] protocol. Discussed with patient and mom 11/4, and will hold off on challenge for now until her acute pain has improved to avoid clouding the picture if she does have a reaction.  - Daily CBC  - [facility] premedication protocol for ferric gluconate challenge if pursued: - Prednisone 50mg  at 13h, 7h, and 1h prior to infusion.  - Benadryl 50mg  at 1h prior to infusion  - Pepcid 40mg , 1h prior to infusion  - Tylenol 650mg      FEN: Regular diet by mouth for comfort/as tolerated. TPN per dietician recommendations.   VTE ppx: LMWH. Can change to Eliquis 2.5 mg BID if patient prefers (per hematology recs)  HCDM [facility] policy, no known patient preference): Yamin,[name] - Mother - [phone]  HCDM, back-up (If primary HCDM is unavailable): [name] - Father - [phone]    I personally spent 35 minutes face-to-face and non-face-to-face in the care of this patient, which includes all pre, intra, and post visit time on the date of service.  All documented time was specific to the E/M visit and does not include any procedures that may have been performed.    ___________________________________________________________________    Subjective:  Per o/n RN notes: States she thought she had blood in stool. Provider notified, new orders for occult blood obtained. FOBT+  Ketamine infusion started 6p yesterday. Pt feels she is doing well with it and was able to sleep overnight without additional meds  PRN hydromorph IV: 520a, 10a, 130p, 450p, 820p, 1150p, 630a, 945a, 120p. No PO doses.  PRN zofran IV 630a  Pt working w/ Chronic Pain on ketamine increase today  Discussed 12hr TPN w/ dietician and this has already been done automatically!    Labs/Studies:  Labs and Studies from the last 24hrs per EMR and Reviewed    Objective:  Temp:  [35.6 ??C (96.1 ??F)-36.5 ??C (97.7 ??F)] 36.5 ??C (97.7 ??F)  Heart Rate:  [72-96] 72  Resp:  [17-18] 18  BP: (112-126)/(57-72) 112/68  SpO2:  [97 %-98 %] 97 %  Gen: Alert and fully interactive, sitting up in bed, bf at bedside  ENT: MMM. Tunneled line in R anterior chest wall, ice pack in place  Pulm: CTAB, normal WOB  Abd: Nondistended  Ext: WWP, no edema or cyanosis  Neuro: A&O x 3, no focal deficits  Psych: Appropriate mood and affect

## 2022-01-14 NOTE — Unmapped (Signed)
PT alert and oriented X4. No s/s distress. Requesting the PRN Dilaudid q3 hrs as available.  States she thought she had blood in stool. Provider notified, new orders for occult blood obtained. PT has not stooled since order was placed.    Problem: Adult Inpatient Plan of Care  Goal: Plan of Care Review  Outcome: Progressing  Goal: Patient-Specific Goal (Individualized)  Outcome: Progressing  Goal: Absence of Hospital-Acquired Illness or Injury  Outcome: Progressing  Intervention: Identify and Manage Fall Risk  Recent Flowsheet Documentation  Taken 01/14/2022 0200 by Dorise Bullion, RN  Safety Interventions:   nonskid shoes/slippers when out of bed   low bed   lighting adjusted for tasks/safety   fall reduction program maintained  Taken 01/13/2022 2200 by Dorise Bullion, RN  Safety Interventions:   nonskid shoes/slippers when out of bed   low bed   lighting adjusted for tasks/safety   fall reduction program maintained  Taken 01/13/2022 2000 by Dorise Bullion, RN  Safety Interventions:   aspiration precautions   fall reduction program maintained   lighting adjusted for tasks/safety   low bed   nonskid shoes/slippers when out of bed  Intervention: Prevent Skin Injury  Recent Flowsheet Documentation  Taken 01/14/2022 0200 by Dorise Bullion, RN  Skin Protection:   adhesive use limited   incontinence pads utilized  Taken 01/13/2022 2200 by Dorise Bullion, RN  Skin Protection:   adhesive use limited   incontinence pads utilized  Taken 01/13/2022 2020 by Dorise Bullion, RN  Positioning for Skin: (Moves independently)   Supine/Back   Other (Comment)  Device Skin Pressure Protection: absorbent pad utilized/changed  Skin Protection:   adhesive use limited   incontinence pads utilized  Taken 01/13/2022 2000 by Dorise Bullion, RN  Positioning for Skin: (Sitting up in bed)   Supine/Back   Other (Comment)  Device Skin Pressure Protection: absorbent pad utilized/changed  Skin Protection:   adhesive use limited   incontinence pads utilized  Intervention: Prevent Infection  Recent Flowsheet Documentation  Taken 01/13/2022 2000 by Dorise Bullion, RN  Infection Prevention:   single patient room provided   rest/sleep promoted   personal protective equipment utilized   hand hygiene promoted   environmental surveillance performed  Goal: Optimal Comfort and Wellbeing  Outcome: Progressing  Goal: Readiness for Transition of Care  Outcome: Progressing  Goal: Rounds/Family Conference  Outcome: Progressing     Problem: Fall Injury Risk  Goal: Absence of Fall and Fall-Related Injury  Outcome: Progressing  Intervention: Promote Injury-Free Environment  Recent Flowsheet Documentation  Taken 01/14/2022 0200 by Dorise Bullion, RN  Safety Interventions:   nonskid shoes/slippers when out of bed   low bed   lighting adjusted for tasks/safety   fall reduction program maintained  Taken 01/13/2022 2200 by Dorise Bullion, RN  Safety Interventions:   nonskid shoes/slippers when out of bed   low bed   lighting adjusted for tasks/safety   fall reduction program maintained  Taken 01/13/2022 2000 by Dorise Bullion, RN  Safety Interventions:   aspiration precautions   fall reduction program maintained   lighting adjusted for tasks/safety   low bed   nonskid shoes/slippers when out of bed     Problem: Malnutrition  Goal: Improved Nutritional Intake  Outcome: Progressing     Problem: Self-Care Deficit  Goal: Improved Ability to Complete Activities of Daily Living  Outcome: Progressing     Problem: Latex Allergy  Goal: Absence of Allergy Symptoms  Outcome: Progressing

## 2022-01-15 LAB — COMPREHENSIVE METABOLIC PANEL
ALBUMIN: 3.2 g/dL — ABNORMAL LOW (ref 3.4–5.0)
ALKALINE PHOSPHATASE: 49 U/L (ref 46–116)
ALT (SGPT): 14 U/L (ref 10–49)
ANION GAP: 6 mmol/L (ref 5–14)
AST (SGOT): 13 U/L (ref <=34)
BILIRUBIN TOTAL: 0.2 mg/dL — ABNORMAL LOW (ref 0.3–1.2)
BLOOD UREA NITROGEN: 16 mg/dL (ref 9–23)
BUN / CREAT RATIO: 42
CALCIUM: 8.6 mg/dL — ABNORMAL LOW (ref 8.7–10.4)
CHLORIDE: 109 mmol/L — ABNORMAL HIGH (ref 98–107)
CO2: 27 mmol/L (ref 20.0–31.0)
CREATININE: 0.38 mg/dL — ABNORMAL LOW
EGFR CKD-EPI (2021) FEMALE: >90 mL/min/{1.73_m2} (ref >=60)
GLUCOSE RANDOM: 93 mg/dL (ref 70–179)
POTASSIUM: 3.4 mmol/L (ref 3.4–4.8)
PROTEIN TOTAL: 5.7 g/dL (ref 5.7–8.2)
SODIUM: 142 mmol/L (ref 135–145)

## 2022-01-15 LAB — IRON PANEL
IRON SATURATION: 12 % — ABNORMAL LOW (ref 20–55)
IRON: 33 ug/dL — ABNORMAL LOW
TOTAL IRON BINDING CAPACITY: 274 ug/dL (ref 250–425)

## 2022-01-15 LAB — CBC
HEMATOCRIT: 31.9 % — ABNORMAL LOW (ref 34.0–44.0)
HEMOGLOBIN: 10.9 g/dL — ABNORMAL LOW (ref 11.3–14.9)
MEAN CORPUSCULAR HEMOGLOBIN CONC: 34.2 g/dL (ref 32.0–36.0)
MEAN CORPUSCULAR HEMOGLOBIN: 29.7 pg (ref 25.9–32.4)
MEAN CORPUSCULAR VOLUME: 86.8 fL (ref 77.6–95.7)
MEAN PLATELET VOLUME: 10.1 fL (ref 6.8–10.7)
PLATELET COUNT: 165 10*9/L (ref 150–450)
RED BLOOD CELL COUNT: 3.67 10*12/L — ABNORMAL LOW (ref 3.95–5.13)
RED CELL DISTRIBUTION WIDTH: 12.1 % — ABNORMAL LOW (ref 12.2–15.2)
WBC ADJUSTED: 6.9 10*9/L (ref 3.6–11.2)

## 2022-01-15 LAB — FERRITIN: FERRITIN: 12.7 ng/mL

## 2022-01-15 LAB — MAGNESIUM: MAGNESIUM: 2 mg/dL (ref 1.6–2.6)

## 2022-01-15 LAB — PHOSPHORUS: PHOSPHORUS: 3.8 mg/dL (ref 2.4–5.1)

## 2022-01-15 MED ADMIN — gabapentin (NEURONTIN) capsule 200 mg: 200 mg | ORAL | @ 03:00:00

## 2022-01-15 MED ADMIN — baclofen (LIORESAL) tablet 10 mg: 10 mg | ORAL | @ 11:00:00

## 2022-01-15 MED ADMIN — Parenteral Nutrition (CENTRAL): INTRAVENOUS | @ 05:00:00 | Stop: 2022-01-15

## 2022-01-15 MED ADMIN — HYDROmorphone (PF) (DILAUDID) injection 1 mg: 1 mg | INTRAVENOUS | @ 04:00:00 | Stop: 2022-01-19

## 2022-01-15 MED ADMIN — HYDROmorphone (PF) (DILAUDID) injection 1 mg: 1 mg | INTRAVENOUS | @ 11:00:00 | Stop: 2022-01-15

## 2022-01-15 MED ADMIN — acetaminophen (TYLENOL) tablet 1,000 mg: 1000 mg | ORAL | @ 18:00:00

## 2022-01-15 MED ADMIN — cefdinir (OMNICEF) capsule 300 mg: 300 mg | ORAL | @ 15:00:00 | Stop: 2022-02-09

## 2022-01-15 MED ADMIN — ondansetron (ZOFRAN) injection 4 mg: 4 mg | INTRAVENOUS | @ 01:00:00

## 2022-01-15 MED ADMIN — acetaminophen (TYLENOL) tablet 1,000 mg: 1000 mg | ORAL | @ 03:00:00

## 2022-01-15 MED ADMIN — triamcinolone (KENALOG) 0.1 % ointment: TOPICAL | @ 17:00:00

## 2022-01-15 MED ADMIN — mirtazapine (REMERON) tablet 7.5 mg: 7.5 mg | ORAL | @ 03:00:00

## 2022-01-15 MED ADMIN — baclofen (LIORESAL) tablet 10 mg: 10 mg | ORAL | @ 03:00:00

## 2022-01-15 MED ADMIN — lidocaine 4 % patch 2 patch: 2 | TRANSDERMAL | @ 15:00:00

## 2022-01-15 MED ADMIN — oxyCODONE (ROXICODONE) immediate release tablet 10 mg: 10 mg | ORAL | @ 18:00:00 | Stop: 2022-01-24

## 2022-01-15 MED ADMIN — ondansetron (ZOFRAN) injection 4 mg: 4 mg | INTRAVENOUS | @ 11:00:00 | Stop: 2022-01-15

## 2022-01-15 MED ADMIN — HYDROmorphone (PF) (DILAUDID) injection 1 mg: 1 mg | INTRAVENOUS | @ 01:00:00 | Stop: 2022-01-19

## 2022-01-15 MED ADMIN — acetaminophen (TYLENOL) tablet 1,000 mg: 1000 mg | ORAL | @ 11:00:00

## 2022-01-15 MED ADMIN — SORAfenib (NexAVAR) tablet 200 mg **patient supplied**: 200 mg | ORAL | @ 15:00:00

## 2022-01-15 MED ADMIN — fat emulsion 20 % with fish oil (SMOFLIPID) infusion 250 mL: 250 mL | INTRAVENOUS | @ 05:00:00 | Stop: 2022-01-15

## 2022-01-15 MED ADMIN — cefdinir (OMNICEF) capsule 300 mg: 300 mg | ORAL | @ 03:00:00 | Stop: 2022-02-09

## 2022-01-15 MED ADMIN — famotidine (PEPCID) tablet 20 mg: 20 mg | ORAL | @ 03:00:00

## 2022-01-15 MED ADMIN — HYDROmorphone (PF) (DILAUDID) injection 1 mg: 1 mg | INTRAVENOUS | @ 15:00:00 | Stop: 2022-01-21

## 2022-01-15 MED ADMIN — HYDROmorphone (PF) (DILAUDID) injection 1 mg: 1 mg | INTRAVENOUS | @ 22:00:00 | Stop: 2022-01-21

## 2022-01-15 MED ADMIN — baclofen (LIORESAL) tablet 10 mg: 10 mg | ORAL | @ 18:00:00

## 2022-01-15 MED ADMIN — ondansetron (ZOFRAN) injection 4 mg: 4 mg | INTRAVENOUS | @ 18:00:00

## 2022-01-15 MED ADMIN — OLANZapine zydis (ZyPREXA) disintegrating tablet 5 mg: 5 mg | ORAL | @ 03:00:00

## 2022-01-15 NOTE — Unmapped (Signed)
Adolescent [facility] Young Adult Cancer Program Visit     Service date: 01/03/2022    Encounter location: Virtual: Video    Clinician: [name], LCSW - AYA Clinical Social Worker    Patient identifiers: [name] [name] is a 21 y.o. with desmoid tumors. [name] [name] uses she/her pronouns.     Visit Summary     Met with the patient for scheduled supportive counseling.    [name] [name] reports continued pain and difficulty with NG tube feeds in the past week. Provided supportive counseling around ongoing health challenges and impacts on relationships.    Follow Up/Plan:     Will see in two weeks.    I will continue to follow this patient for AYA-appropriate support. They have my contact information and have been encouraged to contact me as needed.       01/03/2022     [name], LCSW  Adolescent and Young Adult Clinical Social Worker  Phone: [phone]  Email: [email or web address]

## 2022-01-15 NOTE — Unmapped (Signed)
Department of Anesthesiology  Pain Medicine Division    Chronic Pain Followup Inpatient Consult Note    Requesting Attending Physician:  [name], MD  Service Requesting Consult:  [facility])    Assessment/Recommendations:  The patient was seen in consultation on request of Arman Filter, MD regarding assistance with pain management. The patient is not obtaining adequate pain relief on current medication regimen.      The patient is a 21 y.o. 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 She was also started on TPN this admission per prior outpatient plan.       Chronic pain team was consulted as patient is having acute on chronic pain.  Patient recently received bilateral celiac plexus blocks on November 1 as an outpatient.  Patient reported having right-sided flank and right lower quadrant pain that was thought to be muscle spasms.  Patient was sent to the ER by [name] for possible evaluation of a perforation.  Imaging in the ED was negative for any findings.  In the past patient has had a history of having significant pain flares after trigger point injections that continues for 1 day.  She has used Toradol before which does help with pain relief.  As patient has risk of GI bleeds with normal creatinine and platelets she was recommended to use try Toradol to help with the pain flare.     We discussed continuing with the current management of pain.  We also discussed that we can try starting a ketamine infusion to see if that will help with pain control. At this time, patient wants to hold off and see how current pain medications are doing. We will re-evaluate ketamine 11/6.     Interval: Patient states she finally slept again overnight. Reports having some pain control and decreasing abdominal pain however IV dilaudid helps her the most with this. She stated she was planning to take oral oxycodone yesterday however due to feeling nauseous and very anxious with her grandfather's funeral (which was also yesterday), she did not want to try it. We discussed utilizing oral oxycodone to help bring down her pain and supplement with IV dilaudid. Patient expressed understanding to this plan. We also discussed utilizing phenergan as another option for nausea/ vomiting as it has worked for her in the past.     Recommendations:  -The chronic pain service is a consult service and does not place orders, just makes recommendations (except ketamine and lidocaine infusions)   -Please evaluate all patients on opioids for appropriateness of prescribing narcan at discharge.  The chronic pain service can assist with this.  Nasal narcan is covered by most insurances.  -Recommendations given apply to the current hospitalization and do not reflect long term recommendations.    -Continue Tylenol 1 g every 8 hours  -Continue baclofen 10 mg every 8 hours  -Continue gabapentin 200 every night  -Continue oxycodone 10 mg every 4 hours as needed, encouraged use  -Continue p.o. Dilaudid 3 mg every night as needed  -Continue lidocaine patch  -Continue IV Dilaudid every 3 hours as needed 1 mg, wean as tolerated   -Try Toradol 15 mg every 6 hours as needed with close monitoring as patient does have risk for GI bleeds  - Continue ketamine infusion at 0.07         Ketamine  Infusion Order                    Frequency     ketamine 600 mg in 60 mL (10 mg/mL) infusion        Question Answer Comment   Titrate Medication? Do not Titrate    Call MD if arrhythmias occur        Continuous        -Patient is on a low dose ketamine infusion for pain   -All ketamine orders will be managed by the [facility] Chronic Pain Service only-orders can only be placed by attendings and fellows   -Ketamine requires dedicated IV (PIV or central line lumen)   -Continue ketamine infusion: 0.07mg /kg/hr Actual body weight   -Patient location: floor (can decrease anytime)   -Day 3 (max-7 days). Started (date): 01/13/22   -Prior to starting: Recent EKG, CMP (creatinine, LFTs), urine pregnancy test (if applicable)   -Daily CMP (creatinine and LFTs daily) while running   -Lorazepam is not available PRN for agitation/hallucinations only related to ketamine infusion   -Glycopyrrolate is not available PRN for salivation   -Zofran PRN is available for nausea   -Please contact the Chronic Pain service with any questions or concerns about ketamine infusion.      We will continue to follow.    Naloxone Rx at discharge?  Is patient on opioids? Yes.  1)Is dose >50MME?  Yes.  2) Is patient prescribed a benzodiazepine (w opioids)? No.  3)Hx of overdose?  No.  4) Hx of substance use disorder? No.  5) Opioids likely to last greater than a week after discharge? Yes.     If yes to 2 or more, prescribe naloxone at discharge.  Nasal narcan for most insured (Nasal narcan 4mg /actuation, prescribe 1 kit, instructions at [email or web address]).  For uninsured, chronic pain can work to assist in finding an option.  OTC nasal narcan now available at most pharmacies for around $45.    Interim History  There were no Acute Events Overnight.  The patient is obtaining adequate pain relief on current medication regimen and feels that their pain is Somewhat controlled    Today patient states she was able to sleep again. States she still feels the most relief from her iV dilaudid however will try oxycodone today.     Analgesia Evaluation:  Pain at minimum: 5/10  Pain at maximum: 9/10    Current pain medication regimen (including how frequent PRN's were used):  -Gabapentin 200 mg at night  -Baclofen 10 mg 3 times daily  -Oxycodone 10 mg every 4 hours as needed x0  -Dilaudid 3 mg p.o. nightly   - Dilaudid 1 mg IV q3h x6  - Ketorolac 15 q6h x0  - Ketamine gtt        Inpatient Medications  Current Facility-Administered Medications   Medication Dose Route Frequency Provider Last Rate Last Admin    acetaminophen (TYLENOL) tablet 1,000 mg  1,000 mg Oral Q8H Kateri Plummer, MD   1,000 mg at 01/15/22 0558    baclofen (LIORESAL) tablet 10 mg  10 mg Oral Q8H Kateri Plummer, MD   10 mg at 01/15/22 0558    cefdinir (OMNICEF) capsule 300 mg  300 mg Oral BID Kateri Plummer, MD   300 mg at 01/14/22 2159    dexAMETHasone (DECADRON) 4 mg/mL injection 20 mg  20 mg Intravenous Once PRN Sanoff, Mabeline Caras, MD        diclofenac sodium (VOLTAREN)  1 % gel 2 g  2 g Topical QID PRN Kateri Plummer, MD        diphenhydrAMINE (BENADRYL) capsule/tablet 50 mg  50 mg Oral Q6H PRN Arman Filter, MD        diphenhydrAMINE (BENADRYL) injection 25 mg  25 mg Intravenous Q4H PRN Sanoff, Mabeline Caras, MD        enoxaparin (LOVENOX) syringe 40 mg  40 mg Subcutaneous Q24H [facility] Arman Filter, MD   40 mg at 01/14/22 1610    EPINEPHrine (EPIPEN) injection 0.3 mg  0.3 mg Intramuscular Daily PRN Roni Bread, MD        famotidine (PEPCID) tablet 20 mg  20 mg Oral Nightly Kateri Plummer, MD   20 mg at 01/14/22 2159    Parenteral Nutrition (CENTRAL)   Intravenous Continuous [name], MD 80 mL/hr at 01/14/22 2346 New Bag at 01/14/22 2346    And    fat emulsion 20 % with fish oil (SMOFLIPID) infusion 250 mL  250 mL Intravenous Continuous [name], MD 20.8 mL/hr at 01/14/22 2333 250 mL at 01/14/22 2333    gabapentin (NEURONTIN) capsule 200 mg  200 mg Oral Nightly Kateri Plummer, MD   200 mg at 01/14/22 2159    hydrocortisone 1 % cream   Topical BID PRN Dorise Hiss, ACNP        HYDROmorphone (DILAUDID) tablet 3 mg  3 mg Oral Nightly PRN Kateri Plummer, MD   3 mg at 01/13/22 0049    HYDROmorphone (PF) (DILAUDID) injection 1 mg  1 mg Intravenous Q3H PRN [name], MD   1 mg at 01/15/22 0558    hydrOXYzine (ATARAX) tablet 50 mg  50 mg Oral Q6H PRN Arman Filter, MD        hyoscyamine (LEVSIN) tablet 0.125 mg  0.125 mg Oral Q4H PRN Kateri Plummer, MD        IP OKAY TO TREAT   Other Continuous PRN Forbes Cellar, Mabeline Caras, MD        ketamine 600 mg in 60 mL (10 mg/mL) infusion  0.07 mg/kg/hr Intravenous Continuous Gilmore Laroche, MD 0.4 mL/hr at 01/14/22 1946 0.07 mg/kg/hr at 01/14/22 1946    ketorolac (TORADOL) injection 15 mg  15 mg Intravenous Q6H PRN Arman Filter, MD        lidocaine 4 % patch 2 patch  2 patch Transdermal Daily Kateri Plummer, MD   2 patch at 01/14/22 9604    melatonin tablet 3 mg  3 mg Oral Nightly PRN Kateri Plummer, MD        methylPREDNISolone sodium succinate (PF) (SOLU-Medrol) injection 125 mg  125 mg Intravenous Q4H PRN Sanoff, Mabeline Caras, MD        mirtazapine (REMERON) tablet 7.5 mg  7.5 mg Oral Nightly Kateri Plummer, MD   7.5 mg at 01/14/22 2159    naloxone [facility]) injection 0.1 mg  0.1 mg Intravenous Q5 Min PRN Kateri Plummer, MD        OLANZapine zydis (ZyPREXA) disintegrating tablet 5 mg  5 mg Oral Nightly Kateri Plummer, MD   5 mg at 01/14/22 2159    ondansetron (ZOFRAN-ODT) disintegrating tablet 4 mg  4 mg Oral Q6H PRN Arman Filter, MD   4 mg at 01/13/22 1648    Or    ondansetron (ZOFRAN) injection 4 mg  4 mg Intravenous Q6H PRN Arman Filter, MD  4 mg at 01/15/22 0617    oxyCODONE (ROXICODONE) immediate release tablet 10 mg  10 mg Oral Q4H PRN Kateri Plummer, MD        senna Mancel [facility]ons) tablet 2 tablet  2 tablet Oral Nightly PRN Kateri Plummer, MD        simethicone [facility]) chewable tablet 160 mg  160 mg Oral QID PRN Arman Filter, MD   160 mg at 01/13/22 0958    sodium chloride (NS) 0.9 % infusion  20 mL/hr Intravenous Continuous PRN Sanoff, Mabeline Caras, MD        sodium chloride (NS) 0.9 % infusion  10 mL/hr Intravenous Continuous Gilmore Laroche, MD 10 mL/hr at 01/13/22 1342 10 mL/hr at 01/13/22 1342    sodium chloride 0.9% (NS) bolus 1,000 mL  1,000 mL Intravenous Daily PRN Sanoff, Mabeline Caras, MD        SORAfenib (NexAVAR) tablet 200 mg **patient supplied**  200 mg Oral Daily Sanoff, Mabeline Caras, MD   200 mg at 01/14/22 0954    triamcinolone (KENALOG) 0.1 % ointment   Topical BID PRN Kateri Plummer, MD   Given at 01/14/22 1713         Objective:     Vital Signs    Temp:  [35.7 ??C (96.2 ??F)-37 ??C (98.6 ??F)] 35.9 ??C (96.6 ??F)  Heart Rate:  [73-96] 96  Resp:  [16-18] 16  BP: (113-135)/(61-81) 116/65  MAP (mmHg):  [94-95] 95  SpO2:  [96 %-98 %] 97 %      Physical Exam    GENERAL:  Well developed, well-nourished female and is in no apparent distress.   HEAD/NECK:    Reveals normocephalic/atraumatic.   CARDIOVASCULAR:   Warm, well perfused  LUNGS:   Normal work of breathing  EXTREMITIES:  Warm, no clubbing, cyanosis, or edema was noted. +Tender points along right shoulder blade  ABDOMEN:  Distended, tender to palpation   NEUROLOGIC:    The patient was alert and oriented times four with normal language, attention, cognition and memory. Cranial nerve exam was grossly normal.  Sensation Intact to light touch throughout the bilateral upper and lower extremities.  MUSCULOSKELETAL:    Motor function  5/5 in upper and lower extremities.    SKIN:  No obvious rashes lesions or erythema  PSY:  Appropriate affect and mood.    Test Results    Lab Results   Component Value Date    CREATININE 0.38 (L) 01/15/2022     Lab Results   Component Value Date    ALKPHOS 49 01/15/2022    BILITOT 0.2 (L) 01/15/2022    BILIDIR 0.20 12/04/2021    PROT 5.7 01/15/2022    ALBUMIN 3.2 (L) 01/15/2022    ALT 14 01/15/2022    AST 13 01/15/2022           Problem List    Principal Problem:    Intractable abdominal pain  Active Problems:    [name]    [name]    Dehydration    Severe protein-calorie malnutrition (CMS-HCC)

## 2022-01-15 NOTE — Unmapped (Addendum)
Hospital Medicine Daily Progress Note    Assessment/Plan:    Principal Problem:    Intractable abdominal pain  Active Problems:    [name]    [name]    Dehydration    Severe protein-calorie malnutrition (CMS-HCC)  Resolved Problems:    * No resolved hospital problems. *                 [name] [name] is a 21 y.o. 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 She was also started on TPN this admission per prior outpatient plan.     Acute on chronic abdominal pain  Abdominal desmoid tumors  Patient went for celiac plexus block on 01/08/2022 with [name] as an outpatient and presented with acute right flank and RLQ pain. Per outpatient notes, [name] feels her acute pain response is most consistent with muscle spasms and referred pain from the spasms. CT showed small locule of gas in the left perirenal tract, likely post-procedural from block, but no renal or psoas injury or other acute findings. Patient continues to have severe pain requiring IV hydromorphone q3 hrs. Case discussed with Chronic Pain today 11/5; ketamine drip was considered but deferred given patient's report of irritability/agitation today and uncertain potential drug-drug interaction with her home sorafenib.   - Chronic Pain consulted, appreciate recs, day 3 of ketamine infusion  - Continue home regimen with scheduled tylenol 1000 mg q8h, oxycodone 10 mg q4h PRN during the day, and oral dilaudid 3 mg at bedtime PRN as tolerated.   - Hydromorphone 1 mg IV q3-->q6h PRN severe pain, encourage oxycodone PO instead  - Continue other supportive meds (diclofenac, lidocaine patch)     Severe malnutrition in setting of [name] and abdominal desmoid tumors  Patient has been on TPN twice before, administered through PICC lines which then were ultimately removed and central lines placed (at least once due to clot). She was recently discharged with a corpak and plan for tube feeds at home, but continued to lose weight and have difficulty with pain control and nausea. She was seen recently by Dr. [name] and her outpatient dietitian with plans to resume TPN and had waiting for a direct admission to do. Tunneled line placed 11/3 and she was started on TPN on 11/3. NG tube removed 11/4 due to no plans to continue tube feeds.   - Continue TPN daily. Dr. [name] has agreed to follow TPN orders/labs after discharge.    - Appreciate CM assistance with referral for home infusion  - TPN cycled to 12 hrs overnight per dietician  - VTE ppx per Heme recs while central line in place - switched enox 40-->apixaban 2.5 bid     [name]/Desmoid tumors. Continue sorafenib daily (patient-supplied) per Oncology     Anxiety. 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 Awaiting PA for elidel. Triamcinolone PRN, Benadryl PRN    Chronic iron deficiency  Follows with Hematology. Attributed to menstrual bleeding, impaired absorption from abdominal disease, and chronic GI bleeding. Had infusion reaction to sodium ferric gluconate in 04/2021 requiring ED visit for steroids and Benadryl. Per outpatient heme, can consider doing re-challenge  with premedications per [facility] protocol. Discussed with patient and mom 11/4, and will hold off on challenge for now until her acute pain has improved to avoid clouding the picture if she does have a reaction.  - Daily CBC  - f/u ferritin, fe studies  - [facility] premedication protocol for ferric gluconate challenge if pursued:   - Prednisone 50mg  at 13h, 7h, and 1h prior to infusion.  - Benadryl 50mg  at 1h prior to infusion  - Pepcid 40mg , 1h prior to infusion  - Tylenol 650mg      FEN: Regular diet by mouth for comfort/as tolerated. TPN per dietician recommendations.   VTE ppx: Eliquis 2.5 mg BID   HCDM ([place] policy, no known patient preference): Prindle,[name] - Mother - [phone]  HCDM, back-up (If primary HCDM is unavailable): [name] - Father - [phone]    I personally spent 50 minutes face-to-face and non-face-to-face in the care of this patient, which includes all pre, intra, and post visit time on the date of service.  All documented time was specific to the E/M visit and does not include any procedures that may have been performed.    ___________________________________________________________________    Subjective:  Per XC 6pm 11/7: Pt complained of pre-syncope, numbness/tingling in her hands/feet, nausea, nurse stopped ketamine. Discussed with patient and chronic pain, symptoms are consistent with her usual symptoms, did not seem related to the infusion. We agreed to resume ketamine, if symptoms return, can consider resuming at reduced dose, or stopping altogether if symptoms return more concerning for ketamine side effect.     HM 1mg  IV PRN: 626a, 946a, 123p, 442p, 812p, 1127p, 558a (7 doses/24h)  Yesterday 6 doses/24h  Zofran IV PRN: 4 doses     Discussed pain regimen at length w/ Chronic Pain MD. Unfortunately some misunderstanding w/ plan per pt understanding. Ketamine no change to dose today given symptom difficulties yesterday. Chronic Pain and I in agreement about appropriateness of transitioning IV dilaudid PRN to PO oxycodone PRN (no utilized doses of oxycodone prior to this so lots of options for pain relief in this transition). Unfortunately Chronic Pain note today does not accurately reflect our conversation.    Bloody BMs and onset of menses a bit early. Requests switch enox to eliquis. I reviewed Heme notes w/ clear guidance. Mom requests re-investigating iron levels to consider IV iron. I reviewed Heme recs on premeds in case needed (also embedded and continued forward in this note)    Labs/Studies:  Labs and Studies from the last 24hrs per EMR and Reviewed    Objective:  Temp:  [35.7 ??C (96.2 ??F)-37 ??C (98.6 ??F)] 35.9 ??C (96.6 ??F)  Heart Rate:  [73-96] 96  Resp:  [16-18] 16  BP: (113-135)/(61-81) 116/65  SpO2:  [96 %-98 %] 97 %  Gen: Alert and fully interactive, sitting up in bed, Mom at bedside  ENT: MMM. Tunneled line in R anterior chest wall  Pulm: CTAB, normal WOB  Abd: Nondistended  Ext: WWP, no edema or cyanosis  Neuro: A&O x 3, no focal deficits  Psych: Appropriate mood and affect

## 2022-01-15 NOTE — Unmapped (Signed)
Problem: Adult Inpatient Plan of Care  Goal: Plan of Care Review  Outcome: Progressing  Goal: Patient-Specific Goal (Individualized)  Outcome: Progressing  Goal: Absence of Hospital-Acquired Illness or Injury  Outcome: Progressing  Intervention: Identify and Manage Fall Risk  Recent Flowsheet Documentation  Taken 01/15/2022 0000 by Phineas Real, RN  Safety Interventions: family at bedside  Taken 01/14/2022 2000 by Phineas Real, RN  Safety Interventions:   family at bedside   low bed   lighting adjusted for tasks/safety  Intervention: Prevent Skin Injury  Recent Flowsheet Documentation  Taken 01/14/2022 2000 by Phineas Real, RN  Positioning for Skin: Supine/Back  Skin Protection: adhesive use limited  Intervention: Prevent and Manage VTE (Venous Thromboembolism) Risk  Recent Flowsheet Documentation  Taken 01/15/2022 0400 by Phineas Real, RN  Anti-Embolism Intervention: (on lovenox) Off  Taken 01/15/2022 0200 by Phineas Real, RN  Anti-Embolism Intervention: (on lovenox) Off  Taken 01/15/2022 0000 by Phineas Real, RN  Anti-Embolism Intervention: (on lovenox) Off  Taken 01/14/2022 2200 by Phineas Real, RN  Anti-Embolism Intervention: (on lovenox) Off  Taken 01/14/2022 2000 by Phineas Real, RN  VTE Prevention/Management: ambulation promoted  Anti-Embolism Intervention: (on lovenox) Off  Intervention: Prevent Infection  Recent Flowsheet Documentation  Taken 01/14/2022 2000 by Phineas Real, RN  Infection Prevention:   single patient room provided   rest/sleep promoted  Goal: Optimal Comfort and Wellbeing  Outcome: Progressing  Goal: Readiness for Transition of Care  Outcome: Progressing  Goal: Rounds/Family Conference  Outcome: Progressing     Problem: Fall Injury Risk  Goal: Absence of Fall and Fall-Related Injury  Outcome: Progressing  Intervention: Promote Injury-Free Environment  Recent Flowsheet Documentation  Taken 01/15/2022 0000 by Phineas Real, RN  Safety Interventions: family at bedside  Taken 01/14/2022 2000 by Phineas Real, RN  Safety Interventions:   family at bedside   low bed   lighting adjusted for tasks/safety     Problem: Malnutrition  Goal: Improved Nutritional Intake  Outcome: Progressing     Problem: Self-Care Deficit  Goal: Improved Ability to Complete Activities of Daily Living  Outcome: Progressing     Problem: Latex Allergy  Goal: Absence of Allergy Symptoms  Outcome: Progressing

## 2022-01-16 LAB — CBC
HEMATOCRIT: 30.2 % — ABNORMAL LOW (ref 34.0–44.0)
HEMOGLOBIN: 10.1 g/dL — ABNORMAL LOW (ref 11.3–14.9)
MEAN CORPUSCULAR HEMOGLOBIN CONC: 33.4 g/dL (ref 32.0–36.0)
MEAN CORPUSCULAR HEMOGLOBIN: 29 pg (ref 25.9–32.4)
MEAN CORPUSCULAR VOLUME: 87 fL (ref 77.6–95.7)
MEAN PLATELET VOLUME: 10.4 fL (ref 6.8–10.7)
PLATELET COUNT: 157 10*9/L (ref 150–450)
RED BLOOD CELL COUNT: 3.47 10*12/L — ABNORMAL LOW (ref 3.95–5.13)
RED CELL DISTRIBUTION WIDTH: 12.2 % (ref 12.2–15.2)
WBC ADJUSTED: 5.7 10*9/L (ref 3.6–11.2)

## 2022-01-16 LAB — COMPREHENSIVE METABOLIC PANEL
ALBUMIN: 3 g/dL — ABNORMAL LOW (ref 3.4–5.0)
ALKALINE PHOSPHATASE: 47 U/L (ref 46–116)
ALT (SGPT): 14 U/L (ref 10–49)
ANION GAP: 5 mmol/L (ref 5–14)
AST (SGOT): 15 U/L (ref <=34)
BILIRUBIN TOTAL: 0.2 mg/dL — ABNORMAL LOW (ref 0.3–1.2)
BLOOD UREA NITROGEN: 15 mg/dL (ref 9–23)
BUN / CREAT RATIO: 38
CALCIUM: 8.4 mg/dL — ABNORMAL LOW (ref 8.7–10.4)
CHLORIDE: 109 mmol/L — ABNORMAL HIGH (ref 98–107)
CO2: 28 mmol/L (ref 20.0–31.0)
CREATININE: 0.39 mg/dL — ABNORMAL LOW
EGFR CKD-EPI (2021) FEMALE: >90 mL/min/{1.73_m2} (ref >=60)
GLUCOSE RANDOM: 115 mg/dL (ref 70–179)
POTASSIUM: 3.7 mmol/L (ref 3.4–4.8)
PROTEIN TOTAL: 5.5 g/dL — ABNORMAL LOW (ref 5.7–8.2)
SODIUM: 142 mmol/L (ref 135–145)

## 2022-01-16 LAB — MAGNESIUM: MAGNESIUM: 1.8 mg/dL (ref 1.6–2.6)

## 2022-01-16 LAB — PHOSPHORUS: PHOSPHORUS: 3.9 mg/dL (ref 2.4–5.1)

## 2022-01-16 MED ADMIN — acetaminophen (TYLENOL) tablet 1,000 mg: 1000 mg | ORAL | @ 20:00:00

## 2022-01-16 MED ADMIN — lidocaine 4 % patch 2 patch: 2 | TRANSDERMAL | @ 15:00:00

## 2022-01-16 MED ADMIN — apixaban (ELIQUIS) tablet 2.5 mg: 2.5 mg | ORAL | @ 15:00:00 | Stop: 2022-01-16

## 2022-01-16 MED ADMIN — ondansetron (ZOFRAN) injection 4 mg: 4 mg | INTRAVENOUS

## 2022-01-16 MED ADMIN — ondansetron (ZOFRAN) injection 4 mg: 4 mg | INTRAVENOUS | @ 01:00:00

## 2022-01-16 MED ADMIN — ondansetron (ZOFRAN) injection 4 mg: 4 mg | INTRAVENOUS | @ 15:00:00

## 2022-01-16 MED ADMIN — oxyCODONE (ROXICODONE) immediate release tablet 10 mg: 10 mg | ORAL | @ 20:00:00 | Stop: 2022-01-24

## 2022-01-16 MED ADMIN — HYDROmorphone (PF) (DILAUDID) injection 1 mg: 1 mg | INTRAVENOUS | Stop: 2022-01-21

## 2022-01-16 MED ADMIN — HYDROmorphone (PF) (DILAUDID) injection 1 mg: 1 mg | INTRAVENOUS | @ 11:00:00 | Stop: 2022-01-21

## 2022-01-16 MED ADMIN — baclofen (LIORESAL) tablet 10 mg: 10 mg | ORAL | @ 02:00:00

## 2022-01-16 MED ADMIN — famotidine (PEPCID) tablet 20 mg: 20 mg | ORAL | @ 02:00:00

## 2022-01-16 MED ADMIN — baclofen (LIORESAL) tablet 10 mg: 10 mg | ORAL | @ 20:00:00

## 2022-01-16 MED ADMIN — oxyCODONE (ROXICODONE) immediate release tablet 10 mg: 10 mg | ORAL | @ 03:00:00 | Stop: 2022-01-24

## 2022-01-16 MED ADMIN — cefdinir (OMNICEF) capsule 300 mg: 300 mg | ORAL | @ 02:00:00 | Stop: 2022-02-09

## 2022-01-16 MED ADMIN — cefdinir (OMNICEF) capsule 300 mg: 300 mg | ORAL | @ 15:00:00 | Stop: 2022-02-09

## 2022-01-16 MED ADMIN — apixaban (ELIQUIS) tablet 2.5 mg: 2.5 mg | ORAL | @ 02:00:00

## 2022-01-16 MED ADMIN — fat emulsion 20 % with fish oil (SMOFLIPID) infusion 250 mL: 250 mL | INTRAVENOUS | @ 06:00:00 | Stop: 2022-01-16

## 2022-01-16 MED ADMIN — acetaminophen (TYLENOL) tablet 1,000 mg: 1000 mg | ORAL | @ 02:00:00

## 2022-01-16 MED ADMIN — OLANZapine zydis (ZyPREXA) disintegrating tablet 5 mg: 5 mg | ORAL | @ 04:00:00

## 2022-01-16 MED ADMIN — gabapentin (NEURONTIN) capsule 200 mg: 200 mg | ORAL | @ 04:00:00

## 2022-01-16 MED ADMIN — acetaminophen (TYLENOL) tablet 1,000 mg: 1000 mg | ORAL | @ 11:00:00

## 2022-01-16 MED ADMIN — ketamine 600 mg in 60 mL (10 mg/mL) infusion: .07 mg/kg/h | INTRAVENOUS | @ 11:00:00 | Stop: 2022-01-16

## 2022-01-16 MED ADMIN — oxyCODONE (ROXICODONE) immediate release tablet 10 mg: 10 mg | ORAL | @ 15:00:00 | Stop: 2022-01-24

## 2022-01-16 MED ADMIN — baclofen (LIORESAL) tablet 10 mg: 10 mg | ORAL | @ 11:00:00

## 2022-01-16 MED ADMIN — HYDROmorphone (PF) (DILAUDID) injection 1 mg: 1 mg | INTRAVENOUS | @ 04:00:00 | Stop: 2022-01-21

## 2022-01-16 MED ADMIN — SORAfenib (NexAVAR) tablet 200 mg **patient supplied**: 200 mg | ORAL | @ 15:00:00

## 2022-01-16 MED ADMIN — mirtazapine (REMERON) tablet 7.5 mg: 7.5 mg | ORAL | @ 04:00:00

## 2022-01-16 MED ADMIN — Parenteral Nutrition (CENTRAL): INTRAVENOUS | @ 06:00:00 | Stop: 2022-01-17

## 2022-01-16 MED ADMIN — HYDROmorphone (PF) (DILAUDID) injection 1 mg: 1 mg | INTRAVENOUS | @ 17:00:00 | Stop: 2022-01-21

## 2022-01-16 NOTE — Unmapped (Addendum)
Oncology Treatment Plan Note    Primary Attending Physician:  [name], MD  Primary Service: Med [name]  Reason for Consult: Desmoid fibromatosis on sorafenib  Primary Oncologist: Dr. Donzetta Sprung     Assessment: [name] [name] is a 21 y.o. 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 Patient recently seen (10/24) by Dr. [name] with plan to continue sorafenib.      Recommendations:   -Patient with new mucositis/stomatitis (see image in media tab) over the last couple of days as well as very mild dry scalp, dry skin of the face, dry skin of hands and dry skin of feet. Discussed with outpatient oncologist Dr. [name] and pharmacists Nicholos Johns and Sharlynn Oliphant - see no other meds with DDI with home sorafenib and will continue sorafenib at  200 mg daily with magic mouth wash for mucositis and ammonium lactate BID for possible mild HFRS.  -Patient with history of pouchitis. She has had a drop in Hgb over the past 2 days with positive FOBT (which is very difficult to interpret in setting of her desmoid tumors) with patient and mother concerned for recurrent pouchitis iso concurrent new mucositis, +FOBT and drop in Hgb on [facility] while in hospital. Primary team will consult GI for further evaluation. Sorafenib can cause GIB so this is reasonable and prudent to me as well.  -Patient notes mild spotting which has occurred before with [facility] use.  -Pain management also consulted for pain control. Appreciate their recs and great care by primary.    This patient has been discussed with Dr. Randolph Bing and Dr. [name]. These recommendations were discussed with the primary team.      Please contact the oncology consult fellow at [phone] with any further questions.     Philippa Sicks, MD PhD  Oncology Fellow    -------------------------------------------------------------    Interval history:    Patient notes that she has sandpaper sensation on her tongue this morning and mildly dry skin of scalp, face, hands and feet without cracking.    Objective:   Vitals: Temp:  [36.2 ??C (97.1 ??F)-36.6 ??C (97.8 ??F)] 36.4 ??C (97.6 ??F)  Heart Rate:  [80-97] 84  Resp:  [17-19] 18  BP: (110-118)/(55-69) 118/57  MAP (mmHg):  [77-87] 81  SpO2:  [98 %] 98 %    Physical Exam:  GEN: Pleasant, much more comfortable-appearing woman in NAD  HEENT: PERRL, sclerae anicteric, conjunctiva clear, moist mucous membranes, no oral lesions or exudates, rash on L cheek from feeding tube        NECK: Supple, no JVD  RESP: Normal work of breathing on room air  EXT: Faintly dry skin of hands and feet, no cracking, blistering, erythema  SKIN AND SUBCUTANEOUS TISSUES: No rashes or ecchymoses  NEURO: Alert and oriented, speech intact, following commands, moving all extremities well, no focal deficits appreciated  PSYCH: Normal mood and appropriate affect      Test Results  Recent Labs     01/14/22  0947 01/15/22  0609 01/16/22  0452   WBC 6.6 6.9 5.7   HGB 11.7 10.9* 10.1*   PLT 184 165 157       Current medication list reviewed.

## 2022-01-16 NOTE — Unmapped (Signed)
Hospital Medicine Daily Progress Note    Assessment/Plan:    Principal Problem:    Intractable abdominal pain  Active Problems:    [name]    [name]    Dehydration    Severe protein-calorie malnutrition (CMS-HCC)  Resolved Problems:    * No resolved hospital problems. *                 [name] [name] is a 21 y.o. 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 She was also started on TPN this admission per prior outpatient plan.     Acute on chronic abdominal pain  Abdominal desmoid tumors  Patient went for celiac plexus block on 01/08/2022 with [name] as an outpatient and presented with acute right flank and RLQ pain. Per outpatient notes, [name] feels her acute pain response is most consistent with muscle spasms and referred pain from the spasms. CT showed small locule of gas in the left perirenal tract, likely post-procedural from block, but no renal or psoas injury or other acute findings. Patient continues to have severe pain requiring IV hydromorphone q3 hrs. Case discussed with Chronic Pain today 11/5; ketamine drip was considered but deferred given patient's report of irritability/agitation today and uncertain potential drug-drug interaction with her home sorafenib.   - Chronic Pain consulted, appreciate recs, day 4 of ketamine infusion  - Continue home regimen with scheduled tylenol 1000 mg q8h, oxycodone 10 mg q4h PRN during the day, and oral dilaudid 3 mg at bedtime PRN as tolerated.   - Hydromorphone 1 mg iv q6h PRN severe pain, encourage oxycodone PO instead  - Continue other supportive meds (diclofenac, lidocaine patch)     Chronic iron deficiency. Acute on chronic blood losses  Follows with Hematology. Attributed to menstrual bleeding, impaired absorption from abdominal disease, and chronic GI bleeding. Had infusion reaction to sodium ferric gluconate in 04/2021 requiring ED visit for steroids and Benadryl. Per outpatient heme, can consider doing re-challenge with premedications per [facility] protocol. Ferritin 13, fe 33, fe sat 12%.  - GI consult for GI bleeding  - Pt and mom now interested in IV iron infusion. I reviewed [facility] Heme note  - [facility] premedication protocol for ferric gluconate challenge:   - Prednisone 50mg  at 13h, 7h, and 1h prior to infusion.  - Benadryl 50mg  at 1h prior to infusion  - Pepcid 40mg , 1h prior to infusion  - Tylenol 650mg     Severe malnutrition in setting of [name] and abdominal desmoid tumors  Patient has been on TPN twice before, administered through PICC lines which then were ultimately removed and central lines placed (at least once due to clot). She was recently discharged with a corpak and plan for tube feeds at home, but continued to lose weight and have difficulty with pain control and nausea. She was seen recently by Dr. [name] and her outpatient dietitian with plans to resume TPN and had waiting for a direct admission to do. Tunneled line placed 11/3 and she was started on TPN on 11/3. NG tube removed 11/4 due to no plans to continue tube feeds.   - Continue TPN daily. Dr. [name] has agreed to follow TPN orders/labs after discharge.    - Appreciate CM assistance with referral for home infusion  - VTE ppx per Heme recs while central line in place -  apixaban 2.5 bid     [name]/Desmoid tumors. 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 Continue zyprexa 2.5qhs, Atarax 25 q6prn, Remeron 7.5 at bedtime    Irritant contact dermatitis to adhesive of Duoderm on cheek with NG  Saw dermatology as outpatient. Awaiting PA for elidel. Triamcinolone PRN, Benadryl PRN     FEN: Regular diet by mouth for comfort/as tolerated. TPN per dietician recommendations.   VTE ppx: Eliquis 2.5 mg BID   HCDM: Basquez,[name] - Mother - [phone]  HCDM, back-up: [name] - Father - [phone]    I personally spent 50 minutes face-to-face and non-face-to-face in the care of this patient, which includes all pre, intra, and post visit time on the date of service.  All documented time was specific to the E/M visit and does not include any procedures that may have been performed.    ___________________________________________________________________    Subjective:  Over 24 hrs:  HM IV x4 doses (q6 prn)  Oxy PO x3 doses (q4 prn)  Zofran IV x3 doses  Pt and mom report ongoing pain and concerns about communication re med changes w/ team members. I discussed w/ Dr Bufford Buttner, Dr Eustaquio Maize (who disc w/ [name])  Also bloody BMs and some early menses. I discussed w/ GI who will formally consult  Also sore mouth. I discussed w/ extensive team including pharmacy, Heme  We discussed IV iron infusion w/ pre-meds which we will start dosing (fe) during daylight hours for optimal safety given past reaction    Labs/Studies:  Labs and Studies from the last 24hrs per EMR and Reviewed    Objective:  Temp:  [35.8 ??C (96.4 ??F)-36.6 ??C (97.8 ??F)] 36.2 ??C (97.1 ??F)  Heart Rate:  [80-97] 97  Resp:  [17-19] 19  BP: (110-117)/(55-69) 116/69  SpO2:  [98 %] 98 %  Gen: Alert and fully interactive, sitting up in bed, Mom at bedside  ENT: MMM. Tunneled line in R anterior chest wall  Pulm: CTAB, normal WOB  Abd: NABS, soft, nontend, nondist  Ext: WWP, no edema or cyanosis  Neuro: A&O x 3, no focal deficits  Psych: Appropriate mood and affect

## 2022-01-16 NOTE — Unmapped (Signed)
REE (Kcal/day): 1602                     Pred:             1348  RQ:    1.03  REE/Pred(%):  119                         VCO2(mL/min): 225  VO2(mL/min): 219    VE/VCO2:  33  VE/VO2:  34    CHO/REE(%):  92  Fat/REE(%):  8    REE_Covar(%): 8

## 2022-01-16 NOTE — Unmapped (Signed)
Pt AxOx4. Pt c/o discrepancy of administration times for PRN IV dilaudid, states pain management provider told pt she could have it PRN Q3 hours. Provider made aware.  Pt continues to state she has loose bloody stools. Bed in the lowest position, call light within reach, family member at bedside.   Problem: Adult Inpatient Plan of Care  Goal: Plan of Care Review  Outcome: Progressing  Goal: Patient-Specific Goal (Individualized)  Outcome: Progressing  Goal: Absence of Hospital-Acquired Illness or Injury  Outcome: Progressing  Goal: Optimal Comfort and Wellbeing  Outcome: Progressing  Goal: Readiness for Transition of Care  Outcome: Progressing  Goal: Rounds/Family Conference  Outcome: Progressing     Problem: Fall Injury Risk  Goal: Absence of Fall and Fall-Related Injury  Outcome: Progressing     Problem: Malnutrition  Goal: Improved Nutritional Intake  Outcome: Progressing     Problem: Self-Care Deficit  Goal: Improved Ability to Complete Activities of Daily Living  Outcome: Progressing     Problem: Latex Allergy  Goal: Absence of Allergy Symptoms  Outcome: Progressing

## 2022-01-16 NOTE — Unmapped (Cosign Needed)
Gastroenterology (Luminal) Consult Service   Initial Consultation         Assessment and Recommendations:   [name] [name] is a 21 y.o. female with a PMHx of [name] that presented to [facility] with s/p proctocolectomy with ileoanal anastomosis, desmoid tumors (on sorafenib), anemia, severe protein calorie malnutrition that presented to [facility] with Intractable abdominal pain after recent celiac plexus block. The patient is seen in consultation at the request of [name], MD (Med [facility])) for gastrointestinal bleeding    Patient presenting with 2-3 days of maroon colored stool associated with increased urgency. Baseline Hgb appears to be ~12-13 with admission Hgb being 12.6 and most recent Hgb now 10.4. Patient with two ulcer (one at ileocolonic anastamsosis and one in efferent limb of J Pouch) on last pouchoscopy 12/09/2021. Thus consider this a potential source. Additionally patient with IPAA thus at risk for pouchitis. Given altered anatomy consider potential for UGIB source. Additionally patient with recent NG tube in place, thus consider possible erosion/trauma. Per oncology sorafenib can also cause GIB. Will plan to evaluate with EGD and Pouchoscopy. Given recent apixaban use will need to wait 48 hours. Thus plan for procedures Monday. Please hold apixaban after Saturday morning. In the interim, can consider hydrocortisone suppository for symptom relief    -- Consider Hydrocortisone suppository  -- Please maintain two large-bore (16-18G) peripheral IVs at all times  -- Keep type & screen active  -- Follow the patient's Hgb every 8-12 hours and transfuse for Hgb <7  -- Stop any NSAIDs, aspirin, antiplatelets, and anticoagulants  -- Plan for EGD and Pouchoscopy    -- If patients develops a hemodynamically significant GI bleed, please page the GI fellow on cal    GI Pre-Procedure Checklist  Procedure: Upper Endoscopy and Pouchoscopy  Anticipated Date of Procedure: 11/12  Anticoagulants/Antiplatelets: Hold apixaban for 48 hours prior to procedure (patient on 2.5 for PPX)  Anesthesia Concerns: None  Diet:  Order regular diet now, clear liquid diet Sunday, NPO Monday  Prep: Give 2L Golytley bowel prep Sunday afternoon. Order bowel prep using MED GI PROCEDURES PREP order-set. You must select the appropriate prep, this is not auto-selected. If Golytely is not available from pharmacy, please give Gatorade+Miralax prep.    Recommendations discussed with the patient's primary team. We will continue to follow along with you.    For questions, contact the on-call fellow for the Gastroenterology (Luminal) Consult Service.    Subjective:   Patient initially presented with intractable abdominal pain after celiac plexus block and is being seen by chronic pain team. GI consulted for hematochezia/maroon colored stool. Patient reports blood in each bowel movement, and sometimes only blood without stool for past few days. No hematemesis. Endorses urgency and sensation of bowling ball in rectum.     Hgb 10.4 (12.6 on admission) MCV 87 Plt 157  Iron 33, Tsat 12%, Ferritin 12.7    Patient known to GI and sees Dr. [name] in the outpatient setting, last seen 12/26/21. At that time was having 15 bowel movements daily and endorsing abdominal pain. Plan at that time was to discuss celiac block (now done), control of anxiety with olanzapine to improve nausea. Plan for TPN (now initiated) given malnutrition in setting of [name] and abdominal desmoid tumors.     Most recent pouchoscopy 12/09/21 (see below).    Objective:   Temp:  [36.2 ??C (97.1 ??F)-36.6 ??C (97.8 ??F)] 36.4 ??C (97.6 ??F)  Heart Rate:  [80-97] 84  Resp:  [  17-19] 18  BP: (110-118)/(55-69) 118/57  SpO2:  [98 %] 98 %    Gen: WDWN female in NAD, answers questions appropriately  Skin: rash on left cheek from prior feeding tube  Abdomen: Soft, ND, TTP diffusely, no rebound/guarding  Extremities: No edema in the BLEs    Pertinent Labs/Studies Reviewed:    Pouch Exam 12/09/21:   - A single (solitary) ulcer at the ileocolonic anastomosis.  - The examination was otherwise normal.  - A single (solitary) ulcer in the efferent limb of  j-pouch.  - No specimens collected.    Upper endoscopy 08/2021  -Normal esophagus.  - Multiple gastric polyps. Biopsied.  - Normal stomach otherwise. Biopsied.  - Normal examined duodenum. Biopsied.     A: Duodenum, biopsy  - Adenomatous polyp (1 fragment)  - No high grade dysplasia or carcinoma identified  - Separate fragments of duodenal mucosa with mild-moderate villous blunting  - No increased intraepithelial lymphocytes identified     B: Stomach, biopsy  - Gastric fundic mucosa with chronic superficial gastritis  - Gastric antral mucosa with erosive minimally active chronic superficial gastritis and slight reactive foveolar hyperplasia  - No Helicobacter pylori identified on H&E stain  - No intestinal metaplasia or dysplasia identified     C: Stomach, polypectomy  - Polypoid fragments of gastric fundic mucosa with features suggestive of early fundic gland polyps  - No intestinal metaplasia or dysplasia identified     Pouchoscopy 08/2021   The perianal and digital rectal examinations were normal.       The j-pouch, pouch inlet and rectal cuff appeared normal.       The exam was otherwise without abnormality, including no evidence of        mass of intussusception.                                                           Pouchoscopy 01/23/2021  Impression:            - Preparation of the colon was fair.                         - The examined portion of the ileum was normal.                         - The ileoanal pouch is normal.                         - Rectal cuff with congestion and an intact staple                          line seen.                         - No specimens collected.                          - Hegar dilation 17 - 20 mm

## 2022-01-16 NOTE — Unmapped (Signed)
Department of Anesthesiology  Pain Medicine Division    Chronic Pain Followup Inpatient Consult Note    Requesting Attending Physician:  [name], MD  Service Requesting Consult:  [facility])    Assessment/Recommendations:  The patient was seen in consultation on request of Arman Filter, MD regarding assistance with pain management. The patient is not obtaining adequate pain relief on current medication regimen.      The patient is a 21 y.o. 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 She was also started on TPN this admission per prior outpatient plan.       Chronic pain team was consulted as patient is having acute on chronic pain.  Patient recently received bilateral celiac plexus blocks on November 1 as an outpatient.  Patient reported having right-sided flank and right lower quadrant pain that was thought to be muscle spasms.  Patient was sent to the ER by [name] for possible evaluation of a perforation.  Imaging in the ED was negative for any findings.  In the past patient has had a history of having significant pain flares after trigger point injections that continues for 1 day.  She has used Toradol before which does help with pain relief.  As patient has risk of GI bleeds with normal creatinine and platelets she was recommended to use try Toradol to help with the pain flare.     We discussed continuing with the current management of pain.  We also discussed that we can try starting a ketamine infusion to see if that will help with pain control. At this time, patient wants to hold off and see how current pain medications are doing. We will re-evaluate ketamine 11/6.     Interval: Patient was very upset regarding medication changes that were made yesterday.  Patient states that she normally requires IV Dilaudid however it was changed from being every 3 to every 6.  She is stated that was a big jump however we did discuss that looking at her medication history she was requiring it every 4 hours so the next logical step would be to go to every 6 hours if there was a change.  Patient and mother expressed understanding to that.  Patient states that this is the best she has felt since June or July and she has finally been able to ask for food and sleep better.  She stated that if she is going to be on a ketamine infusion, it would make sense to control her pain with IV medications as needed as she could go home otherwise and continue taking her oxycodone 10 mg.  We discussed that as far as other medications go, we would touch base with her primary team to figure out the best plan moving forward. We discussed that if she is not having any side effects to the ketamine infusion we can increase the dose at this time.  We did discuss that her hopes are that as we increase her ketamine, her Dilaudid requirements will decrease with the supplementation of oxycodone.  Patient denies having any hallucinations, continued blurry vision or increased agitation.  Of note patient's mother did report that she was starting to have some GI bleeding and asked if ketamine could contribute to this.  We discussed that ketamine does not have this as a normal side effect however we  can look into it further.    Recommendations:  -The chronic pain service is a consult service and does not place orders, just makes recommendations (except ketamine and lidocaine infusions)   -Please evaluate all patients on opioids for appropriateness of prescribing narcan at discharge.  The chronic pain service can assist with this.  Nasal narcan is covered by most insurances.  -Recommendations given apply to the current hospitalization and do not reflect long term recommendations.    -Continue Tylenol 1 g every 8 hours  -Continue baclofen 10 mg every 8 hours  -Continue gabapentin 200 every night  -Continue oxycodone 10 mg every 4 hours as needed, encouraged use  -Continue p.o. Dilaudid 3 mg every night as needed  -Continue lidocaine patch  -Continue IV Dilaudid 1 mg every 6 hours as needed, wean as tolerated   - Continue ketamine infusion at 0.1         Ketamine Infusion Order                    Frequency     ketamine 600 mg in 60 mL (10 mg/mL) infusion        Question Answer Comment   Titrate Medication? Do not Titrate    Call MD if arrhythmias occur        Continuous        -Patient is on a low dose ketamine infusion for pain   -All ketamine orders will be managed by the [facility] Chronic Pain Service only-orders can only be placed by attendings and fellows   -Ketamine requires dedicated IV (PIV or central line lumen)   -Continue ketamine infusion: 0.1mg /kg/hr Actual body weight   -Patient location: floor (can decrease anytime)   -Day 4 (max-7 days). Started (date): 01/13/22   -Prior to starting: Recent EKG, CMP (creatinine, LFTs), urine pregnancy test (if applicable)   -Daily CMP (creatinine and LFTs daily) while running   -Lorazepam is not available PRN for agitation/hallucinations only related to ketamine infusion   -Glycopyrrolate is not available PRN for salivation   -Zofran PRN is available for nausea   -Please contact the Chronic Pain service with any questions or concerns about ketamine infusion.      We will continue to follow.    Naloxone Rx at discharge?  Is patient on opioids? Yes.  1)Is dose >50MME?  Yes.  2) Is patient prescribed a benzodiazepine (w opioids)? No.  3)Hx of overdose?  No.  4) Hx of substance use disorder? No.  5) Opioids likely to last greater than a week after discharge? Yes.     If yes to 2 or more, prescribe naloxone at discharge.  Nasal narcan for most insured (Nasal narcan 4mg /actuation, prescribe 1 kit, instructions at [email or web address]).  For uninsured, chronic pain can work to assist in finding an option.  OTC nasal narcan now available at most pharmacies for around $45.    Interim History  There were no Acute Events Overnight.  Patient and mother were very frustrated with her medication regimen changed yesterday.  Patient states that she believed there would be no medication changes however her Dilaudid was moved from every 3 hours to every 6 hours which is a big jump.  Patient states for the first time in months she has been able to sleep and ask for food because she finally feels more functional.  Patient states that in the past she had many flareups however is finally feeling better.  She also states that if she is  going to be on ketamine she should also be taking care of in terms of her pain.  She stated that if she only needed the oxycodone, she would be going home.    Analgesia Evaluation:  Pain at minimum:  8/10  Pain at maximum: 9/10    Current pain medication regimen (including how frequent PRN's were used):  -Gabapentin 200 mg at night  -Baclofen 10 mg 3 times daily  -Oxycodone 10 mg every 4 hours as needed x2  -Dilaudid 3 mg p.o. nightly   - Dilaudid 1 mg IV q6h x3  - Ketorolac 15 q6h x0  - Ketamine gtt        Inpatient Medications  Current Facility-Administered Medications   Medication Dose Route Frequency Provider Last Rate Last Admin    acetaminophen (TYLENOL) tablet 1,000 mg  1,000 mg Oral Q8H Kateri Plummer, MD   1,000 mg at 01/16/22 0552    apixaban (ELIQUIS) tablet 2.5 mg  2.5 mg Oral BID [name], MD   2.5 mg at 01/15/22 2129    baclofen (LIORESAL) tablet 10 mg  10 mg Oral Q8H Kateri Plummer, MD   10 mg at 01/16/22 1610    cefdinir (OMNICEF) capsule 300 mg  300 mg Oral BID Kateri Plummer, MD   300 mg at 01/15/22 2129    diclofenac sodium (VOLTAREN) 1 % gel 2 g  2 g Topical QID PRN Kateri Plummer, MD        diphenhydrAMINE (BENADRYL) capsule/tablet 50 mg  50 mg Oral Q6H PRN Arman Filter, MD        famotidine (PEPCID) tablet 20 mg  20 mg Oral Nightly Kateri Plummer, MD   20 mg at 01/15/22 2129    Parenteral Nutrition (CENTRAL)   Intravenous Continuous [name], MD 80 mL/hr at 01/16/22 0037 New Bag at 01/16/22 0037    And    fat emulsion 20 % with fish oil (SMOFLIPID) infusion 250 mL  250 mL Intravenous Continuous [name], MD 20.8 mL/hr at 01/16/22 0038 250 mL at 01/16/22 0038    [START ON 01/17/2022] Parenteral Nutrition (CENTRAL)   Intravenous Continuous [name], MD        And    [START ON 01/17/2022] fat emulsion 20 % with fish oil (SMOFLIPID) infusion 250 mL  250 mL Intravenous Continuous [name], MD        gabapentin (NEURONTIN) capsule 200 mg  200 mg Oral Nightly Kateri Plummer, MD   200 mg at 01/15/22 2325    hydrocortisone 1 % cream   Topical BID PRN Dorise Hiss, ACNP        HYDROmorphone (DILAUDID) tablet 3 mg  3 mg Oral Nightly PRN [name], MD        HYDROmorphone (PF) (DILAUDID) injection 1 mg  1 mg Intravenous Q6H PRN [name], MD   1 mg at 01/16/22 0552    hydrOXYzine (ATARAX) tablet 50 mg  50 mg Oral Q6H PRN Arman Filter, MD        hyoscyamine (LEVSIN) tablet 0.125 mg  0.125 mg Oral Q4H PRN Kateri Plummer, MD        ketamine 600 mg in 60 mL (10 mg/mL) infusion  0.07 mg/kg/hr Intravenous Continuous Gilmore Laroche, MD 0.4 mL/hr at 01/16/22 0605 0.07 mg/kg/hr at 01/16/22 0605    lidocaine 4 % patch 2 patch  2 patch Transdermal Daily Shelly Rubenstein  D, MD   2 patch at 01/15/22 1017    melatonin tablet 3 mg  3 mg Oral Nightly PRN Kateri Plummer, MD        mirtazapine (REMERON) tablet 7.5 mg  7.5 mg Oral Nightly Kateri Plummer, MD   7.5 mg at 01/15/22 2326    naloxone [facility]) injection 0.1 mg  0.1 mg Intravenous Q5 Min PRN Kateri Plummer, MD        OLANZapine zydis (ZyPREXA) disintegrating tablet 5 mg  5 mg Oral Nightly Kateri Plummer, MD   5 mg at 01/15/22 2327    ondansetron (ZOFRAN-ODT) disintegrating tablet 4 mg  4 mg Oral Q6H PRN [name], MD        Or    ondansetron [facility]) injection 4 mg  4 mg Intravenous Q6H PRN [name], MD   4 mg at 01/15/22 1931    oxyCODONE (ROXICODONE) immediate release tablet 10 mg  10 mg Oral Q4H PRN Kateri Plummer, MD   10 mg at 01/15/22 2140    senna (SENOKOT) tablet 2 tablet  2 tablet Oral Nightly PRN Kateri Plummer, MD        simethicone [facility]) chewable tablet 160 mg  160 mg Oral QID PRN Arman Filter, MD   160 mg at 01/13/22 0958    sodium chloride (NS) 0.9 % infusion  10 mL/hr Intravenous Continuous Gilmore Laroche, MD 10 mL/hr at 01/13/22 1342 10 mL/hr at 01/13/22 1342    SORAfenib (NexAVAR) tablet 200 mg **patient supplied**  200 mg Oral Daily Sanoff, Mabeline Caras, MD   200 mg at 01/15/22 1020    triamcinolone (KENALOG) 0.1 % ointment   Topical BID PRN Kateri Plummer, MD   Given at 01/15/22 1215         Objective:     Vital Signs    Temp:  [35.8 ??C (96.4 ??F)-36.6 ??C (97.8 ??F)] 36.2 ??C (97.1 ??F)  Heart Rate:  [80-97] 97  Resp:  [17-19] 19  BP: (110-117)/(55-69) 116/69  MAP (mmHg):  [77-87] 87  SpO2:  [98 %] 98 %      Physical Exam    GENERAL:  Well developed, well-nourished female and is in no apparent distress.   HEAD/NECK:    Reveals normocephalic/atraumatic.   CARDIOVASCULAR:   Warm, well perfused  LUNGS:   Normal work of breathing  EXTREMITIES:  Warm, no clubbing, cyanosis, or edema was noted.   ABDOMEN:  tender to deep palpation  NEUROLOGIC:    The patient was alert and oriented times four with normal language, attention, cognition and memory. Cranial nerve exam was grossly normal.  Sensation Intact to light touch throughout the bilateral upper and lower extremities.  MUSCULOSKELETAL:    Motor function  5/5 in upper and lower extremities.    SKIN:  No obvious rashes lesions or erythema  PSY:  Appropriate affect and mood.    Test Results    Lab Results   Component Value Date    CREATININE 0.39 (L) 01/16/2022     Lab Results   Component Value Date    ALKPHOS 47 01/16/2022    BILITOT 0.2 (L) 01/16/2022    BILIDIR 0.20 12/04/2021    PROT 5.5 (L) 01/16/2022    ALBUMIN 3.0 (L) 01/16/2022    ALT 14 01/16/2022    AST 15 01/16/2022           Problem List    Principal Problem:  Intractable abdominal pain  Active Problems:    [name]    [name]    Dehydration    Severe protein-calorie malnutrition (CMS-HCC)

## 2022-01-16 NOTE — Unmapped (Signed)
Adolescent and Young Adult Cancer Program Visit     Service date: January 15, 2022    Encounter location: Inpatient - Adult    Clinician: [name], LCSW - AYA Clinical Social Worker    Patient identifiers: [name] [name] is a 21 y.o. with desmoid tumors and [name]. [name] [name] uses she/her pronouns.     Visit Summary     Met with the patient for scheduled supportive counseling.    Medical questions:  Described a rash in her mouth that is making it difficult to eat, and voiced question about whether Dr. [name] still wishes for her to increase her sorafenib dose soon.    Depression and passive SI:   She shared more about feeling significantly depressed over the past couple of months. Her family knows she is anxious, but she has not felt comfortable sharing the extent of her depression with them. Staff is encouraged to protect her privacy around her mental health concerns.    She shared that over the past month or two, she has had thoughts of wanting to fall asleep and not wake up. She has been able to share these thoughts with her boyfriend, who experienced depression and attempted or contemplated attempting suicide when he was younger. She said he has been encouraging and supportive, and has helped remind her of all the people who love her and want her here.    She denies any thoughts of harming herself or attempting suicide. She states she has not had thoughts about ways to hurt herself, either through active means or more passive means such as withholding food or medication.     She is reluctant to consider more or different psychiatric medication for a variety of reasons, but is open to me informing Dr. Malcolm Metro of our discussion. Provided support, encouragement of continued openness about her mental health, and identification of protective factors.    Bereavement and family conflict:  Her grandfather, who she was very close to, died 09/19/2022. His funeral was this week while she was in the hospital. In addition to grief, this loss has brought to the surface a number of strained family relationships, including relating to her half-brother who has not been part of her life for several years. Her family is dealing with additional stress related to these issues, though she shared they prefer not to talk about it.      Follow Up/Plan:     -Informed oncology consult team and primary hospital team of Cordie's report of a painful rash in her mouth that is an additional barrier to PO intake, and her question about sorafenib dose increase    -Informed Dr. Malcolm Metro of discussion around depression and passive SI    I will continue to follow this patient for AYA-appropriate support. They have my contact information and have been encouraged to contact me as needed.     Flowsheet     AYA Assessment  Primary Caregiver: Parent  Location Seen: Inpatient  Contact Point: During treatment  Referral Source: Self-Referred  Issues Discussed: Mental Health, Symptom management, Family concerns / conflict, Spiritual  AYA Team Interventions: Supportive counseling, Care coordination  Follow Up: As needed  Time Spent (in minutes): 45    01/16/2022     [name], LCSW  Adolescent and Young Adult Clinical Social Worker  Phone: [phone]  Email: [email or web address]

## 2022-01-17 LAB — CBC
HEMATOCRIT: 29.7 % — ABNORMAL LOW (ref 34.0–44.0)
HEMOGLOBIN: 9.9 g/dL — ABNORMAL LOW (ref 11.3–14.9)
MEAN CORPUSCULAR HEMOGLOBIN CONC: 33.3 g/dL (ref 32.0–36.0)
MEAN CORPUSCULAR HEMOGLOBIN: 28.9 pg (ref 25.9–32.4)
MEAN CORPUSCULAR VOLUME: 86.9 fL (ref 77.6–95.7)
MEAN PLATELET VOLUME: 10.8 fL — ABNORMAL HIGH (ref 6.8–10.7)
PLATELET COUNT: 177 10*9/L (ref 150–450)
RED BLOOD CELL COUNT: 3.41 10*12/L — ABNORMAL LOW (ref 3.95–5.13)
RED CELL DISTRIBUTION WIDTH: 12.2 % (ref 12.2–15.2)
WBC ADJUSTED: 5.1 10*9/L (ref 3.6–11.2)

## 2022-01-17 LAB — COMPREHENSIVE METABOLIC PANEL
ALBUMIN: 3.1 g/dL — ABNORMAL LOW (ref 3.4–5.0)
ALKALINE PHOSPHATASE: 45 U/L — ABNORMAL LOW (ref 46–116)
ALT (SGPT): 20 U/L (ref 10–49)
ANION GAP: 6 mmol/L (ref 5–14)
AST (SGOT): 10 U/L (ref <=34)
BILIRUBIN TOTAL: 0.3 mg/dL (ref 0.3–1.2)
BLOOD UREA NITROGEN: 16 mg/dL (ref 9–23)
BUN / CREAT RATIO: 42
CALCIUM: 8.6 mg/dL — ABNORMAL LOW (ref 8.7–10.4)
CHLORIDE: 110 mmol/L — ABNORMAL HIGH (ref 98–107)
CO2: 26 mmol/L (ref 20.0–31.0)
CREATININE: 0.38 mg/dL — ABNORMAL LOW
EGFR CKD-EPI (2021) FEMALE: >90 mL/min/{1.73_m2} (ref >=60)
GLUCOSE RANDOM: 213 mg/dL — ABNORMAL HIGH (ref 70–179)
POTASSIUM: 3.8 mmol/L (ref 3.4–4.8)
PROTEIN TOTAL: 5.9 g/dL (ref 5.7–8.2)
SODIUM: 142 mmol/L (ref 135–145)

## 2022-01-17 LAB — BASIC METABOLIC PANEL
ANION GAP: 9 mmol/L (ref 5–14)
BLOOD UREA NITROGEN: 16 mg/dL (ref 9–23)
BUN / CREAT RATIO: 46
CALCIUM: 8.5 mg/dL — ABNORMAL LOW (ref 8.7–10.4)
CHLORIDE: 108 mmol/L — ABNORMAL HIGH (ref 98–107)
CO2: 23 mmol/L (ref 20.0–31.0)
CREATININE: 0.35 mg/dL — ABNORMAL LOW
EGFR CKD-EPI (2021) FEMALE: >90 mL/min/{1.73_m2} (ref >=60)
GLUCOSE RANDOM: 238 mg/dL — ABNORMAL HIGH (ref 70–179)
POTASSIUM: 3.9 mmol/L (ref 3.4–4.8)
SODIUM: 140 mmol/L (ref 135–145)

## 2022-01-17 LAB — VITAMIN D 25 HYDROXY: VITAMIN D, TOTAL (25OH): 12.2 ng/mL — ABNORMAL LOW (ref 20.0–80.0)

## 2022-01-17 LAB — MAGNESIUM
MAGNESIUM: 2.1 mg/dL (ref 1.6–2.6)
MAGNESIUM: 2.1 mg/dL (ref 1.6–2.6)

## 2022-01-17 LAB — PHOSPHORUS
PHOSPHORUS: 2.2 mg/dL — ABNORMAL LOW (ref 2.4–5.1)
PHOSPHORUS: 2.1 mg/dL — ABNORMAL LOW (ref 2.4–5.1)

## 2022-01-17 MED ADMIN — oxyCODONE (ROXICODONE) immediate release tablet 10 mg: 10 mg | ORAL | @ 15:00:00 | Stop: 2022-01-24

## 2022-01-17 MED ADMIN — cefdinir (OMNICEF) capsule 300 mg: 300 mg | ORAL | @ 14:00:00 | Stop: 2022-02-09

## 2022-01-17 MED ADMIN — oxyCODONE (ROXICODONE) immediate release tablet 10 mg: 10 mg | ORAL | @ 08:00:00 | Stop: 2022-01-24

## 2022-01-17 MED ADMIN — predniSONE (DELTASONE) tablet 50 mg: 50 mg | ORAL | @ 02:00:00 | Stop: 2022-01-16

## 2022-01-17 MED ADMIN — HYDROmorphone (PF) (DILAUDID) injection 1 mg: 1 mg | INTRAVENOUS | @ 12:00:00 | Stop: 2022-01-17

## 2022-01-17 MED ADMIN — apixaban (ELIQUIS) tablet 2.5 mg: 2.5 mg | ORAL | @ 14:00:00 | Stop: 2022-01-18

## 2022-01-17 MED ADMIN — insulin lispro (HumaLOG) injection 0-20 Units: 0-20 [IU] | SUBCUTANEOUS | @ 18:00:00

## 2022-01-17 MED ADMIN — famotidine (PEPCID) tablet 20 mg: 20 mg | ORAL | @ 02:00:00

## 2022-01-17 MED ADMIN — baclofen (LIORESAL) tablet 10 mg: 10 mg | ORAL | @ 02:00:00

## 2022-01-17 MED ADMIN — baclofen (LIORESAL) tablet 10 mg: 10 mg | ORAL | @ 12:00:00

## 2022-01-17 MED ADMIN — ondansetron (ZOFRAN) injection 4 mg: 4 mg | INTRAVENOUS | @ 06:00:00

## 2022-01-17 MED ADMIN — HYDROmorphone (PF) (DILAUDID) injection 0.5 mg: .5 mg | INTRAVENOUS | @ 18:00:00 | Stop: 2022-01-21

## 2022-01-17 MED ADMIN — ammonium lactate (LAC-HYDRIN) 12 % lotion 1 application.: 1 | TOPICAL | @ 14:00:00

## 2022-01-17 MED ADMIN — gabapentin (NEURONTIN) capsule 200 mg: 200 mg | ORAL | @ 02:00:00

## 2022-01-17 MED ADMIN — mirtazapine (REMERON) tablet 7.5 mg: 7.5 mg | ORAL | @ 02:00:00

## 2022-01-17 MED ADMIN — Parenteral Nutrition (CENTRAL): INTRAVENOUS | @ 05:00:00 | Stop: 2022-01-18

## 2022-01-17 MED ADMIN — fat emulsion 20 % with fish oil (SMOFLIPID) infusion 250 mL: 250 mL | INTRAVENOUS | @ 05:00:00 | Stop: 2022-01-17

## 2022-01-17 MED ADMIN — ondansetron (ZOFRAN) injection 4 mg: 4 mg | INTRAVENOUS | @ 18:00:00

## 2022-01-17 MED ADMIN — predniSONE (DELTASONE) tablet 50 mg: 50 mg | ORAL | @ 08:00:00 | Stop: 2022-01-17

## 2022-01-17 MED ADMIN — acetaminophen (TYLENOL) tablet 1,000 mg: 1000 mg | ORAL | @ 12:00:00

## 2022-01-17 MED ADMIN — oxyCODONE (ROXICODONE) immediate release tablet 10 mg: 10 mg | ORAL | @ 02:00:00 | Stop: 2022-01-24

## 2022-01-17 MED ADMIN — acetaminophen (TYLENOL) tablet 1,000 mg: 1000 mg | ORAL | @ 02:00:00

## 2022-01-17 MED ADMIN — sodium ferric gluconate (FERRLECIT) 250 mg in sodium chloride (NS) 0.9 % 100 mL IVPB: 250 mg | INTRAVENOUS | @ 15:00:00 | Stop: 2022-01-17

## 2022-01-17 MED ADMIN — potassium phosphate 30 mmol in sodium chloride (NS) 0.9 % 250 mL infusion: 30 mmol | INTRAVENOUS | @ 18:00:00 | Stop: 2022-01-17

## 2022-01-17 MED ADMIN — magic mouthwash oral suspension (diphenhydrAMINE, nystatin) 10 mL: 10 mL | ORAL | @ 23:00:00

## 2022-01-17 MED ADMIN — diphenhydrAMINE (BENADRYL) capsule/tablet 50 mg: 50 mg | ORAL | @ 14:00:00 | Stop: 2022-01-17

## 2022-01-17 MED ADMIN — cefdinir (OMNICEF) capsule 300 mg: 300 mg | ORAL | @ 02:00:00 | Stop: 2022-02-09

## 2022-01-17 MED ADMIN — ondansetron (ZOFRAN) injection 4 mg: 4 mg | INTRAVENOUS | @ 12:00:00

## 2022-01-17 MED ADMIN — lidocaine 4 % patch 2 patch: 2 | TRANSDERMAL | @ 14:00:00

## 2022-01-17 MED ADMIN — magic mouthwash oral suspension (diphenhydrAMINE, nystatin) 10 mL: 10 mL | ORAL | @ 16:00:00

## 2022-01-17 MED ADMIN — SORAfenib (NexAVAR) tablet 200 mg **patient supplied**: 200 mg | ORAL | @ 14:00:00

## 2022-01-17 MED ADMIN — OLANZapine zydis (ZyPREXA) disintegrating tablet 5 mg: 5 mg | ORAL | @ 02:00:00

## 2022-01-17 MED ADMIN — famotidine (PEPCID) tablet 40 mg: 40 mg | ORAL | @ 14:00:00 | Stop: 2022-01-17

## 2022-01-17 MED ADMIN — predniSONE (DELTASONE) tablet 50 mg: 50 mg | ORAL | @ 14:00:00 | Stop: 2022-01-17

## 2022-01-17 MED ADMIN — baclofen (LIORESAL) tablet 10 mg: 10 mg | ORAL | @ 18:00:00

## 2022-01-17 MED ADMIN — HYDROmorphone (PF) (DILAUDID) injection 1 mg: 1 mg | INTRAVENOUS | @ 06:00:00 | Stop: 2022-01-17

## 2022-01-17 MED ADMIN — oxyCODONE (ROXICODONE) immediate release tablet 10 mg: 10 mg | ORAL | @ 22:00:00 | Stop: 2022-01-24

## 2022-01-17 MED ADMIN — magic mouthwash oral suspension (diphenhydrAMINE, nystatin) 10 mL: 10 mL | ORAL | @ 02:00:00

## 2022-01-17 MED ADMIN — apixaban (ELIQUIS) tablet 2.5 mg: 2.5 mg | ORAL | @ 02:00:00 | Stop: 2022-01-18

## 2022-01-17 MED ADMIN — ammonium lactate (LAC-HYDRIN) 12 % lotion 1 application.: 1 | TOPICAL | @ 02:00:00

## 2022-01-17 MED ADMIN — acetaminophen (TYLENOL) tablet 1,000 mg: 1000 mg | ORAL | @ 18:00:00

## 2022-01-17 NOTE — Unmapped (Signed)
VENOUS ACCESS ULTRASOUND PROCEDURE NOTE    Indications:   Poor venous access.    The Venous Access Team has assessed this patient for the placement of a PIV. Ultrasound guidance was necessary to obtain access.     Procedure Details:  Identity of the patient was confirmed via name, medical record number and date of birth. The availability of the correct equipment was verified.    The vein was identified for ultrasound catheter insertion.  Field was prepared with necessary supplies and equipment.  Probe cover and sterile gel utilized.  Insertion site was prepped with chlorhexidine solution and allowed to dry.  The catheter extension was primed with normal saline.A(n) 22 gauge 1.75 catheter was placed in the L Forearm with 1attempt(s). See LDA for additional details.    Catheter aspirated, 4 mL blood return present. The catheter was then flushed with 10 mL of normal saline. Insertion site cleansed, and dressing applied per manufacturer guidelines. The catheter was inserted with difficulty due to poor vasculatureby Michel Santee, RN.      RN was notified.     Thank you,     Michel Santee, RN Venous Access Team   [phone]     Workup / Procedure Time:  30 minutes    See vein image below:

## 2022-01-17 NOTE — Unmapped (Signed)
Problem: Adult Inpatient Plan of Care  Goal: Plan of Care Review  Outcome: Progressing  Goal: Patient-Specific Goal (Individualized)  Outcome: Progressing  Goal: Absence of Hospital-Acquired Illness or Injury  Outcome: Progressing  Intervention: Identify and Manage Fall Risk  Recent Flowsheet Documentation  Taken 01/16/2022 2200 by Phineas Real, RN  Safety Interventions:   low bed   lighting adjusted for tasks/safety  Taken 01/16/2022 2000 by Phineas Real, RN  Safety Interventions:   low bed   lighting adjusted for tasks/safety  Intervention: Prevent Skin Injury  Recent Flowsheet Documentation  Taken 01/16/2022 2200 by Phineas Real, RN  Positioning for Skin: Supine/Back  Skin Protection: adhesive use limited  Taken 01/16/2022 2000 by Phineas Real, RN  Positioning for Skin: Supine/Back  Skin Protection: adhesive use limited  Intervention: Prevent and Manage VTE (Venous Thromboembolism) Risk  Recent Flowsheet Documentation  Taken 01/16/2022 2000 by Phineas Real, RN  VTE Prevention/Management:   ambulation promoted   anticoagulant therapy  Goal: Optimal Comfort and Wellbeing  Outcome: Progressing  Goal: Readiness for Transition of Care  Outcome: Progressing  Goal: Rounds/Family Conference  Outcome: Progressing     Problem: Fall Injury Risk  Goal: Absence of Fall and Fall-Related Injury  Outcome: Progressing  Intervention: Identify and Manage Contributors  Recent Flowsheet Documentation  Taken 01/16/2022 2000 by Phineas Real, RN  Self-Care Promotion: independence encouraged  Intervention: Promote Injury-Free Environment  Recent Flowsheet Documentation  Taken 01/16/2022 2200 by Phineas Real, RN  Safety Interventions:   low bed   lighting adjusted for tasks/safety  Taken 01/16/2022 2000 by Phineas Real, RN  Safety Interventions:   low bed   lighting adjusted for tasks/safety     Problem: Malnutrition  Goal: Improved Nutritional Intake  Outcome: Progressing     Problem: Self-Care Deficit  Goal: Improved Ability to Complete Activities of Daily Living  Outcome: Progressing  Intervention: Promote Activity and Functional Independence  Recent Flowsheet Documentation  Taken 01/16/2022 2000 by Phineas Real, RN  Self-Care Promotion: independence encouraged     Problem: Latex Allergy  Goal: Absence of Allergy Symptoms  Outcome: Progressing

## 2022-01-17 NOTE — Unmapped (Signed)
Patient A/O x4. VSS. Dilaudid and Oxycodone given for pain. Zofran given for nausea. Blood glucose checks initiated. Sliding scale insulin initiated. Patient and family indicate understanding.       Problem: Adult Inpatient Plan of Care  Goal: Plan of Care Review  Outcome: Ongoing - Unchanged  Goal: Patient-Specific Goal (Individualized)  Outcome: Ongoing - Unchanged  Goal: Absence of Hospital-Acquired Illness or Injury  Outcome: Ongoing - Unchanged  Intervention: Identify and Manage Fall Risk  Recent Flowsheet Documentation  Taken 01/17/2022 1400 by Allena Napoleon, RN  Safety Interventions:   fall reduction program maintained   family at bedside   lighting adjusted for tasks/safety   low bed   nonskid shoes/slippers when out of bed  Taken 01/17/2022 1200 by Allena Napoleon, RN  Safety Interventions:   fall reduction program maintained   family at bedside   lighting adjusted for tasks/safety   low bed   nonskid shoes/slippers when out of bed  Taken 01/17/2022 1000 by [employer], Dorien Chihuahua, RN  Safety Interventions:   fall reduction program maintained   family at bedside   lighting adjusted for tasks/safety   low bed   nonskid shoes/slippers when out of bed  Taken 01/17/2022 0800 by [employer], Dorien Chihuahua, RN  Safety Interventions:   fall reduction program maintained   family at bedside   lighting adjusted for tasks/safety   low bed   nonskid shoes/slippers when out of bed  Intervention: Prevent Infection  Recent Flowsheet Documentation  Taken 01/17/2022 1400 by Allena Napoleon, RN  Infection Prevention:   hand hygiene promoted   personal protective equipment utilized  Taken 01/17/2022 1200 by [employer], Dorien Chihuahua, RN  Infection Prevention:   hand hygiene promoted   personal protective equipment utilized  Taken 01/17/2022 1000 by [employer], Dorien Chihuahua, RN  Infection Prevention:   hand hygiene promoted   personal protective equipment utilized  Taken 01/17/2022 0800 by [employer], Denine Brotz L, RN  Infection Prevention:   hand hygiene promoted   personal protective equipment utilized  Goal: Optimal Comfort and Wellbeing  Outcome: Ongoing - Unchanged  Goal: Readiness for Transition of Care  Outcome: Ongoing - Unchanged  Goal: Rounds/Family Conference  Outcome: Ongoing - Unchanged     Problem: Fall Injury Risk  Goal: Absence of Fall and Fall-Related Injury  Outcome: Ongoing - Unchanged  Intervention: Promote Scientist, clinical (histocompatibility and immunogenetics) Documentation  Taken 01/17/2022 1400 by Allena Napoleon, RN  Safety Interventions:   fall reduction program maintained   family at bedside   lighting adjusted for tasks/safety   low bed   nonskid shoes/slippers when out of bed  Taken 01/17/2022 1200 by [employer], Dorien Chihuahua, RN  Safety Interventions:   fall reduction program maintained   family at bedside   lighting adjusted for tasks/safety   low bed   nonskid shoes/slippers when out of bed  Taken 01/17/2022 1000 by [employer], Dorien Chihuahua, RN  Safety Interventions:   fall reduction program maintained   family at bedside   lighting adjusted for tasks/safety   low bed   nonskid shoes/slippers when out of bed  Taken 01/17/2022 0800 by [employer], Dorien Chihuahua, RN  Safety Interventions:   fall reduction program maintained   family at bedside   lighting adjusted for tasks/safety   low bed   nonskid shoes/slippers when out of bed     Problem: Malnutrition  Goal: Improved Nutritional Intake  Outcome: Ongoing - Unchanged     Problem: Self-Care Deficit  Goal: Improved  Ability to Complete Activities of Daily Living  Outcome: Ongoing - Unchanged     Problem: Latex Allergy  Goal: Absence of Allergy Symptoms  Outcome: Ongoing - Unchanged

## 2022-01-17 NOTE — Unmapped (Signed)
Department of Anesthesiology  Pain Medicine Division    Chronic Pain Followup Inpatient Consult Note    Requesting Attending Physician:  [name], MD  Service Requesting Consult:  Med Bernita Raisin [facility])    Assessment/Recommendations:  The patient was seen in consultation on request of Arman Filter, MD regarding assistance with pain management. The patient is not obtaining adequate pain relief on current medication regimen.      The patient is a 21 y.o. 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 She was also started on TPN this admission per prior outpatient plan.       Chronic pain team was consulted as patient is having acute on chronic pain.  Patient recently received bilateral celiac plexus blocks on November 1 as an outpatient.  Patient reported having right-sided flank and right lower quadrant pain that was thought to be muscle spasms.  Patient was sent to the ER by [name] for possible evaluation of a perforation.  Imaging in the ED was negative for any findings.  In the past patient has had a history of having significant pain flares after trigger point injections that continues for 1 day.  She has used Toradol before which does help with pain relief.  As patient has risk of GI bleeds with normal creatinine and platelets she was recommended to use try Toradol to help with the pain flare.     We discussed continuing with the current management of pain.  We also discussed that we can try starting a ketamine infusion to see if that will help with pain control. At this time, patient wants to hold off and see how current pain medications are doing. We will re-evaluate ketamine 11/6.     Interval: Patient states she had a good discussion with [name] regarding her pain plan yesterday. She reported her understanding as we go up on the ketamine, we would slowly titrate down the IV dilaudid. We discussed that we can reduce the dose to 0.5 mg iV dilaudid q6h PRN (as suggested by patient and mother) instead of reducing the time interval. We also stated we would increase the ketamine infusion today as she is not having any side effects such as double vision, hallucinations, agitation. If patient were to have side effects, we can decrease dose back to 0.1 mg/kg/hr or turn it off depending on response. We continued to encourage PO oxycodone use and patient expressed understanding at this time.     Recommendations:  -The chronic pain service is a consult service and does not place orders, just makes recommendations (except ketamine and lidocaine infusions)   -Please evaluate all patients on opioids for appropriateness of prescribing narcan at discharge.  The chronic pain service can assist with this.  Nasal narcan is covered by most insurances.  -Recommendations given apply to the current hospitalization and do not reflect long term recommendations.    -Continue Tylenol 1 g every 8 hours  -Continue baclofen 10 mg every 8 hours  -Continue gabapentin 200 every night  -Continue PO oxycodone 10 mg every 4 hours as needed, encouraged use  -Continue p.o. Dilaudid 3 mg every night as needed  -Continue lidocaine patch  -Continue IV Dilaudid 0.5 mg every 6 hours as needed, wean as tolerated   - Continue ketamine infusion at 0.13 mg/kg/hr  Ketamine Infusion Order                    Frequency     ketamine (KETALAR) 10 mg/mL infusion  (ADULTS:  Medications + Nursing Interventions)        Question Answer Comment   Titrate Medication? Do not Titrate    Call MD if hemodynamic instability        Continuous        -Patient is on a low dose ketamine infusion for pain   -All ketamine orders will be managed by the [facility] Chronic Pain Service only-orders can only be placed by attendings and fellows   -Ketamine requires dedicated IV (PIV or central line lumen)   -Continue ketamine infusion: 0.13mg /kg/hr Actual body weight   -Patient location: floor (can decrease anytime)   -Day 4 (0-7 days). Started (date): 01/13/22   -Prior to starting: Recent EKG, CMP (creatinine, LFTs), urine pregnancy test (if applicable)   -Daily CMP (creatinine and LFTs daily) while running   -Lorazepam is not available PRN for agitation/hallucinations only related to ketamine infusion   -Glycopyrrolate is not available PRN for salivation   -Zofran PRN is available for nausea   -Please contact the Chronic Pain service with any questions or concerns about ketamine infusion.      We will continue to follow.    Naloxone Rx at discharge?  Is patient on opioids? Yes.  1)Is dose >50MME?  Yes.  2) Is patient prescribed a benzodiazepine (w opioids)? No.  3)Hx of overdose?  No.  4) Hx of substance use disorder? No.  5) Opioids likely to last greater than a week after discharge? Yes.     If yes to 2 or more, prescribe naloxone at discharge.  Nasal narcan for most insured (Nasal narcan 4mg /actuation, prescribe 1 kit, instructions at [email or web address]).  For uninsured, chronic pain can work to assist in finding an option.  OTC nasal narcan now available at most pharmacies for around $45.    Interim History  There were no Acute Events Overnight.  Patient states she got less sleep overnight and felt like she was behind on her pain regimen as her dilaudid was changed too rapidly recently. Patient reported feeling better with ketamine and denied side effects such as vision changes, hallucinations, agitation.     Analgesia Evaluation:  Pain at minimum:  8/10  Pain at maximum: 9/10    Current pain medication regimen (including how frequent PRN's were used):  -Gabapentin 200 mg at night  -Baclofen 10 mg 3 times daily  -Oxycodone 10 mg every 4 hours as needed x4  -Dilaudid 3 mg p.o. nightly   - Dilaudid 1 mg IV q6h x4  - Ketorolac 15 q6h x0  - Ketamine gtt        Inpatient Medications  Current Facility-Administered Medications   Medication Dose Route Frequency Provider Last Rate Last Admin    acetaminophen (TYLENOL) tablet 1,000 mg  1,000 mg Oral Q8H Kateri Plummer, MD   1,000 mg at 01/17/22 0637    ammonium lactate (LAC-HYDRIN) 12 % lotion 1 application.  1 application. Topical BID [name], MD   1 application. at 01/16/22 2125    apixaban (ELIQUIS) tablet 2.5 mg  2.5 mg Oral BID [name], MD   2.5 mg at 01/16/22 2114    baclofen (LIORESAL) tablet 10 mg  10 mg Oral Q8H Kateri Plummer, MD   10 mg at 01/17/22 5188    cefdinir (  OMNICEF) capsule 300 mg  300 mg Oral BID Kateri Plummer, MD   300 mg at 01/16/22 2114    diclofenac sodium (VOLTAREN) 1 % gel 2 g  2 g Topical QID PRN Kateri Plummer, MD        diphenhydrAMINE (BENADRYL) capsule/tablet 50 mg  50 mg Oral Q6H PRN Arman Filter, MD        diphenhydrAMINE (BENADRYL) capsule/tablet 50 mg  50 mg Oral Once [name], MD        famotidine (PEPCID) tablet 20 mg  20 mg Oral Nightly Kateri Plummer, MD   20 mg at 01/16/22 2114    famotidine (PEPCID) tablet 40 mg  40 mg Oral Once [name], MD        Parenteral Nutrition (CENTRAL)   Intravenous Continuous [name], MD 80 mL/hr at 01/17/22 0022 New Bag at 01/17/22 0022    And    fat emulsion 20 % with fish oil (SMOFLIPID) infusion 250 mL  250 mL Intravenous Continuous [name], MD 20.8 mL/hr at 01/17/22 0023 250 mL at 01/17/22 0023    gabapentin (NEURONTIN) capsule 200 mg  200 mg Oral Nightly Kateri Plummer, MD   200 mg at 01/16/22 2115    hydrocortisone 1 % cream   Topical BID PRN Dorise Hiss, ACNP        HYDROmorphone (DILAUDID) tablet 3 mg  3 mg Oral Nightly PRN [name], MD        HYDROmorphone (PF) (DILAUDID) injection 1 mg  1 mg Intravenous Q6H PRN [name], MD   1 mg at 01/17/22 9811    hydrOXYzine (ATARAX) tablet 50 mg  50 mg Oral Q6H PRN Arman Filter, MD        hyoscyamine (LEVSIN) tablet 0.125 mg  0.125 mg Oral Q4H PRN Kateri Plummer, MD        ketamine (KETALAR) 10 mg/mL infusion  0.1 mg/kg/hr Intravenous Continuous Gilmore Laroche, MD 0.5 mL/hr at 01/16/22 1309 0.1 mg/kg/hr at 01/16/22 1309    lidocaine 4 % patch 2 patch  2 patch Transdermal Daily Kateri Plummer, MD   2 patch at 01/16/22 9147    magic mouthwash oral suspension (diphenhydrAMINE, nystatin) 10 mL  10 mL Oral QID PRN [name], MD   10 mL at 01/16/22 2126    melatonin tablet 3 mg  3 mg Oral Nightly PRN Kateri Plummer, MD        mirtazapine (REMERON) tablet 7.5 mg  7.5 mg Oral Nightly Kateri Plummer, MD   7.5 mg at 01/16/22 2120    naloxone [facility]) injection 0.1 mg  0.1 mg Intravenous Q5 Min PRN Kateri Plummer, MD        OLANZapine zydis (ZyPREXA) disintegrating tablet 5 mg  5 mg Oral Nightly Kateri Plummer, MD   5 mg at 01/16/22 2115    ondansetron (ZOFRAN-ODT) disintegrating tablet 4 mg  4 mg Oral Q6H PRN [name], MD        Or    ondansetron [facility]) injection 4 mg  4 mg Intravenous Q6H PRN [name], MD   4 mg at 01/17/22 8295    oxyCODONE (ROXICODONE) immediate release tablet 10 mg  10 mg Oral Q4H PRN Kateri Plummer, MD   10 mg at 01/17/22 0235    predniSONE (DELTASONE) tablet 50 mg  50 mg Oral Once [name],  MD        senna (SENOKOT) tablet 2 tablet  2 tablet Oral Nightly PRN Kateri Plummer, MD        simethicone [facility]) chewable tablet 160 mg  160 mg Oral QID PRN Arman Filter, MD   160 mg at 01/13/22 0958    sodium chloride (NS) 0.9 % infusion  10 mL/hr Intravenous Continuous Gilmore Laroche, MD 10 mL/hr at 01/13/22 1342 10 mL/hr at 01/13/22 1342    sodium ferric gluconate (FERRLECIT) 250 mg in sodium chloride (NS) 0.9 % 100 mL IVPB  250 mg Intravenous Once [name], MD        SORAfenib (NexAVAR) tablet 200 mg **patient supplied**  200 mg Oral Daily Sanoff, Mabeline Caras, MD   200 mg at 01/16/22 0957    triamcinolone (KENALOG) 0.1 % ointment   Topical BID PRN Kateri Plummer, MD   Given at 01/15/22 1215         Objective:     Vital Signs    Temp:  [36.4 ??C (97.6 ??F)-37.1 ??C (98.8 ??F)] 36.6 ??C (97.9 ??F)  Heart Rate:  [84-95] 95  Resp:  [16-18] 16  BP: (116-132)/(57-63) 132/58  MAP (mmHg):  [81-84] 84  SpO2:  [97 %-98 %] 98 %      Physical Exam    GENERAL:  Well developed, well-nourished female and is in no apparent distress.   HEAD/NECK:    Reveals normocephalic/atraumatic.   CARDIOVASCULAR:   Warm, well perfused  LUNGS:   Normal work of breathing  EXTREMITIES:  Warm, no clubbing, cyanosis, or edema was noted.   ABDOMEN:  tender to deep palpation  NEUROLOGIC:    The patient was alert and oriented times four with normal language, attention, cognition and memory. Cranial nerve exam was grossly normal.  Sensation Intact to light touch throughout the bilateral upper and lower extremities.  MUSCULOSKELETAL:    Motor function  5/5 in upper and lower extremities.    SKIN:  No obvious rashes lesions or erythema  PSY:  Appropriate affect and mood.    Test Results    Lab Results   Component Value Date    CREATININE 0.39 (L) 01/16/2022     Lab Results   Component Value Date    ALKPHOS 47 01/16/2022    BILITOT 0.2 (L) 01/16/2022    BILIDIR 0.20 12/04/2021    PROT 5.5 (L) 01/16/2022    ALBUMIN 3.0 (L) 01/16/2022    ALT 14 01/16/2022    AST 15 01/16/2022           Problem List    Principal Problem:    Intractable abdominal pain  Active Problems:    [name]    [name]    Dehydration    Severe protein-calorie malnutrition (CMS-HCC)

## 2022-01-17 NOTE — Unmapped (Signed)
Hospital Medicine Daily Progress Note    Assessment/Plan:    Principal Problem:    Intractable abdominal pain  Active Problems:    [name]    [name]    Dehydration    Severe protein-calorie malnutrition (CMS-HCC)  Resolved Problems:    * No resolved hospital problems. *                 [name] [name] is a 21 y.o. 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 She was also started on TPN this admission per prior outpatient plan.     Acute on chronic abdominal pain  Abdominal desmoid tumors  Patient went for celiac plexus block on 01/08/2022 with [name] as an outpatient and presented with acute right flank and RLQ pain. Per outpatient notes, [name] feels her acute pain response is most consistent with muscle spasms and referred pain from the spasms. CT showed small locule of gas in the left perirenal tract, likely post-procedural from block, but no renal or psoas injury or other acute findings. Patient continues to have severe pain requiring IV hydromorphone q3 hrs. Case discussed with Chronic Pain today 11/5; ketamine drip was considered but deferred given patient's report of irritability/agitation today and uncertain potential drug-drug interaction with her home sorafenib.   - Chronic Pain consulted, appreciate recs, day 5 of ketamine infusion  - Continue home regimen with scheduled tylenol 1000 mg q8h, oxycodone 10 mg q4h PRN during the day, and oral dilaudid 3 mg at bedtime PRN as tolerated.   - Hydromorphone 0.5 mg iv q6h PRN severe pain, encourage oxycodone PO instead  - Continue other supportive meds (diclofenac, lidocaine patch)     Chronic iron deficiency. Acute on chronic blood losses  Follows with Hematology. Attributed to menstrual bleeding, impaired absorption from abdominal disease, and chronic GI bleeding. Had infusion reaction to sodium ferric gluconate in 04/2021 requiring ED visit for steroids and Benadryl. Per outpatient heme, can consider doing re-challenge with premedications per [facility] protocol. Ferritin 13, fe 33, fe sat 12%.  - GI consult for GI bleeding - EGD/pouchoscopy Mon. Hold eliquis after Sat AM dose  - [facility] premedication protocol for ferric gluconate challenge:   - Prednisone 50mg  at 13h, 7h, and 1h prior to infusion.  - Benadryl 50mg  at 1h prior to infusion  - Pepcid 40mg , 1h prior to infusion  - Tylenol 650mg   Discussed w/ pharmacist and will continue w/ q12h IV iron infusions w/ pre-meds only at 1 hr    Severe malnutrition in setting of [name] and abdominal desmoid tumors  Patient has been on TPN twice before, administered through PICC lines which then were ultimately removed and central lines placed (at least once due to clot). She was recently discharged with a corpak and plan for tube feeds at home, but continued to lose weight and have difficulty with pain control and nausea. She was seen recently by Dr. [name] and her outpatient dietitian with plans to resume TPN and had waiting for a direct admission to do. Tunneled line placed 11/3 and she was started on TPN on 11/3. NG tube removed 11/4 due to no plans to continue tube feeds.   - Continue TPN daily. Dr. [name] has agreed to follow TPN orders/labs after discharge.    - Appreciate CM assistance with referral for home infusion  - VTE  ppx per Heme recs while central line in place - apixaban 2.5 bid     [name]/Desmoid tumors. 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 Continue zyprexa 2.5qhs, Atarax 25 q6prn, Remeron 7.5 at bedtime    Irritant contact dermatitis to adhesive of Duoderm on cheek with NG  Saw dermatology as outpatient. Awaiting PA for elidel. Triamcinolone PRN, Benadryl PRN     FEN: Regular diet by mouth for comfort/as tolerated. TPN per dietician recommendations.   VTE ppx: Eliquis 2.5 mg BID   HCDM: Poarch,[name] - Mother - [phone]  HCDM, back-up: [name] - Father - [phone]    I personally spent 50 minutes face-to-face and non-face-to-face in the care of this patient, which includes all pre, intra, and post visit time on the date of service.  All documented time was specific to the E/M visit and does not include any procedures that may have been performed.    ___________________________________________________________________    Subjective:  Over 24 hrs:  HM IV x4 doses (q6 prn)  Oxy PO x3 doses (q4 prn)  Zofran IV x4 doses  Met with patient and Mom with Dr. Bufford Buttner given communication issues around pain meds  Discussed IV iron infusion today and pre-meds and adding fsbg/ssi for elev BG    Labs/Studies:  Labs and Studies from the last 24hrs per EMR and Reviewed    Objective:  Temp:  [36.4 ??C (97.6 ??F)-37.1 ??C (98.8 ??F)] 36.6 ??C (97.9 ??F)  Heart Rate:  [84-95] 95  Resp:  [16-18] 16  BP: (116-132)/(57-63) 132/58  SpO2:  [97 %-98 %] 98 %  Gen: Alert and fully interactive, sitting up in bed, Mom at bedside  ENT: MMM. Tunneled line in R anterior chest wall  Pulm: CTAB, normal WOB  Abd: NABS, soft, mild tend, nondist  Ext: WWP, no edema or cyanosis  Neuro: A&O x 3, no focal deficits  Psych: Appropriate mood and affect

## 2022-01-17 NOTE — Unmapped (Signed)
PARENTERAL NUTRITION CONSULTATION NOTE     Requesting Attending Physician :  [name], MD    Reason for Consult:    [name] [name] is a 21 y.o. female seen in consultation at the request of Dr. [name], MD for evaluation of  parenteral nutrition.    NUTRITION ASSESSMENT     Anthropometric Data:  Height: 165.1 cm (5' 5)   Admission weight: 50.3 kg (111 lb)  Last recorded weight: 51.5 kg (113 lb 9.6 oz)  IBW: 56.75 kg  Percent IBW: 95.33 %  BMI: Body mass index is 18.9 kg/m??.   Usual Body Weight:  118-120# per past RD note.     Weight history prior to admission:  Noted 10-15# weight loss in past ~6 months.    11% weight loss, significant.   Wt Readings from Last 10 Encounters:   01/10/22 51.5 kg (113 lb 9.6 oz)   01/02/22 52.3 kg (115 lb 6.4 oz)   12/31/21 52.2 kg (115 lb 1.6 oz)   12/26/21 52.2 kg (115 lb)   12/19/21 50.7 kg (111 lb 12.8 oz)   12/03/21 51.6 kg (113 lb 12.8 oz)   10/23/21 52.3 kg (115 lb 6.4 oz)   10/02/21 53.1 kg (117 lb)   10/02/21 53.3 kg (117 lb 9.6 oz)   08/27/21 53.1 kg (117 lb)   06/28/21         56.9 kg (125 lb)    Weight changes this admission:   Last 5 Recorded Weights    01/10/22 1046 01/10/22 1140 01/10/22 1949   Weight: 50.3 kg (111 lb) 54.1 kg (119 lb 4.3 oz) 51.5 kg (113 lb 9.6 oz)        Nutrition Focused Physical Exam:  Unable to complete at this time due to patient availability       NUTRITIONALLY RELEVANT DATA     Parenteral Nutrition Formula:  Recent TPN Orders (Show up to 1 orders ; newest on the right.)       Start date and time  01/18/2022 0000       Parenteral Nutrition (CENTRAL) [[id number]]       linked to   fat emulsion 20 % with fish oil (SMOFLIPID) infusion 250 mL     Order Status Active     Frequency Continuous        Macro Ingredients    amino acid (CLINISOL SF) 15% 75 g     dextrose 250 g     fat emulsion 20 % with fish oil 250 mL *        Electrolytes    sodium acetate 70 mEq     potassium acetate 20 mEq     potassium phosphate 42 mmol magnesium sulfate dilution 14 mEq     calcium gluconate 5 mEq        Additives    multivitamin, adult injection 10 mL     trace elements (TRALEMENT) Zn-Cu-Mn-Se 1 mL        Medications    thiamine 180 mg        QS Base    sterile water 13.41 mL        Energy Contribution    Proteins 300 kcal     Dextrose 850 kcal     Lipids 500 kcal *     Total 1,650 kcal        Electrolyte Ion Calculated Amount    Sodium 70 mEq     Potassium 81.6 mEq  Calcium 5 mEq     Magnesium 14 mEq     Aluminum --     Phosphate 42 mmol     Chloride --     Acetate 90 mEq     Chloride: Acetate Ratio --        Trace Elements    Copper 0.3 mg     Manganese 55 mcg     Selenium 60 mcg     Zinc 3 mg        Other    Total Amino Acid 75 g     Total Amino Acid/kg 1.46 g/kg     Glucose Infusion Rate 6.74 mg/kg/min     Osmolarity 2,343.54     Volume 960 mL     Rate 80 mL/hr     Dosing Weight 51.5 kg     Infusion Site Central     Total Multi-vitamins 10 mL        Admin Instructions      Please infuse using a parenteral nutrition tubing set (non-DEHP) with a 1.2-micron filter. Listed in [facility] as Set IV for Parenteral Nutrition # [id number].  central line only           * Lipid contribution from the linked fat emulsion order. Any non-lipid contribution from the associated lipid order is not included.              Labs:   Results in Past 7 Days  Result Component Current Result   Sodium 140 (01/17/2022)   Potassium 3.9 (01/17/2022)   Chloride 108 (H) (01/17/2022)   CO2 23.0 (01/17/2022)   BUN 16 (01/17/2022)   Creatinine 0.35 (L) (01/17/2022)   Glucose 238 (H) (01/17/2022)   Calcium 8.5 (L) (01/17/2022)   Ionized Calcium Not in Time Range   Magnesium 2.1 (01/17/2022)   Phosphorus 2.1 (L) (01/17/2022)   Albumin 3.1 (L) (01/17/2022)   Total Bilirubin 0.3 (01/17/2022)   Bilirubin, Direct Not in Time Range   AST 10 (01/17/2022)   ALT 20 (01/17/2022)   Alkaline Phosphatase 45 (L) (01/17/2022)   Triglycerides Not in Time Range         Nutrition History:   11/10: cycled, labs were stable until this morning (IV iron infusion, benadryl, steroids) - hyperglycemia and hypophosphatemia. In discussion w/ TPN team and attending:   -30 mmol k phos bolus   -keep TPN cycled   -add sliding scale insulin per attending   -keep phos level in TPN the same as levels stable before    January 10, 2022: Prior to admission: Patient unable to provide at this time. Patient in VIR for line placement.  Obtained info from past Epic notes.  Patient was recent admit with tube feeds started on 10/6: Osmolite 1.5 goal 50 ml/hr x 16 hours (patient preference).  Plan was to meet ~75% estimated nutrition needs via EN, PO intake for remaining 25%.  Per MD notes, patient with increased pain with tube feeds, plans to start TPN.  Patient has had TPN in the past several times. Per previous RD notes patient with side effects on TPN previously: fluid overload, fatty liver/increased LFTs.      Allergies, Intolerances, Sensitivities, and/or Cultural/Religious Dietary Restrictions: include IV iron and papaya    Current Nutrition:  Oral intake  - house diet   Parenteral nutrition via CVC Double Lumen tunneled R-internal jugular placed 01/10/22.        Nutritional Needs:   Daily Estimated Nutrient Needs:  Energy: 1500-1750 kcals 30-35  kcal/kg using admission body weight, 50 kg (01/10/22 1350)]  Protein: 60-75 gm [1.2-1.5 gm/kg using admission body weight, 50 kg (01/10/22 1350)]  Carbohydrate:   [45-60% of kcal]  Fluid:   mL [per MD team]      Malnutrition Assessment using AND/ASPEN Clinical Characteristics:    Severe Protein-Calorie Malnutrition in the context of chronic illness (01/17/22 1131)  Energy Intake: < or equal to 75% of estimated energy requirement for > or equal to 1 month  Interpretation of Wt. Loss: > 10% x 6 month  Malnutrition Score: 2    GOALS and EVALUATION     Patient to meet 80% or greater of nutritional needs via parenteral nutrition within 7 days.  - Meeting/Ongoing    Motivation, Barriers, and Compliance:  Evaluation of motivation, barriers, and compliance completed. No concerns identified at this time.     NUTRITION ASSESSMENT     Patient appropriate for parenteral nutrition based on the following ASPEN criteria of inability to advance and tolerate tube feeds at goal.  Per discussion with MD, no EN to be given at this time.  PO intake as patient tolerates for comfort.      Discharge Planning:   Monitor for potential discharge needs with multi-disciplinary team.       NUTRITION INTERVENTIONS and RECOMMENDATION     Renew TPN daily   Cycled to 12 hours in preparation for home   Hypophosphatemia and hyperglycemia today. Per discussion w/ attending and TPN team:   30 mmol k phos bolus   Keep TPN phos level stable for tonight's bag   Weekend TPN PharmD to monitor labs this weekend   Sliding scale insulin   Keep TPN cycled   Please notify TPN team 48 -72 hours in advance of plans for DC home to allow preparations for DC.   Dr [name] w/ GI plans to follow for home TPN Rx     Follow-Up Parameters:   Monitor daily while on TPN    Forestine Chute MPH, RD, CNSC, LDN   Pager: [phone]  Phone: [phone]  [email or web address]

## 2022-01-18 LAB — CBC
HEMATOCRIT: 24.6 % — ABNORMAL LOW (ref 34.0–44.0)
HEMOGLOBIN: 8.5 g/dL — ABNORMAL LOW (ref 11.3–14.9)
MEAN CORPUSCULAR HEMOGLOBIN CONC: 34.6 g/dL (ref 32.0–36.0)
MEAN CORPUSCULAR HEMOGLOBIN: 29.8 pg (ref 25.9–32.4)
MEAN CORPUSCULAR VOLUME: 86.2 fL (ref 77.6–95.7)
MEAN PLATELET VOLUME: 10.1 fL (ref 6.8–10.7)
PLATELET COUNT: 180 10*9/L (ref 150–450)
RED BLOOD CELL COUNT: 2.86 10*12/L — ABNORMAL LOW (ref 3.95–5.13)
RED CELL DISTRIBUTION WIDTH: 12 % — ABNORMAL LOW (ref 12.2–15.2)
WBC ADJUSTED: 13.5 10*9/L — ABNORMAL HIGH (ref 3.6–11.2)

## 2022-01-18 LAB — COMPREHENSIVE METABOLIC PANEL
ALBUMIN: 2.9 g/dL — ABNORMAL LOW (ref 3.4–5.0)
ALKALINE PHOSPHATASE: 39 U/L — ABNORMAL LOW (ref 46–116)
ALT (SGPT): 18 U/L (ref 10–49)
ANION GAP: 6 mmol/L (ref 5–14)
AST (SGOT): 20 U/L (ref <=34)
BILIRUBIN TOTAL: 0.3 mg/dL (ref 0.3–1.2)
BLOOD UREA NITROGEN: 15 mg/dL (ref 9–23)
BUN / CREAT RATIO: 41
CALCIUM: 7.7 mg/dL — ABNORMAL LOW (ref 8.7–10.4)
CHLORIDE: 111 mmol/L — ABNORMAL HIGH (ref 98–107)
CO2: 26 mmol/L (ref 20.0–31.0)
CREATININE: 0.37 mg/dL — ABNORMAL LOW
EGFR CKD-EPI (2021) FEMALE: >90 mL/min/{1.73_m2} (ref >=60)
GLUCOSE RANDOM: 236 mg/dL — ABNORMAL HIGH (ref 70–99)
POTASSIUM: 4 mmol/L (ref 3.4–4.8)
PROTEIN TOTAL: 5.1 g/dL — ABNORMAL LOW (ref 5.7–8.2)
SODIUM: 143 mmol/L (ref 135–145)

## 2022-01-18 LAB — MAGNESIUM: MAGNESIUM: 2.4 mg/dL (ref 1.6–2.6)

## 2022-01-18 LAB — PHOSPHORUS: PHOSPHORUS: 3.1 mg/dL (ref 2.4–5.1)

## 2022-01-18 LAB — HEMOGLOBIN: HEMOGLOBIN: 8.7 g/dL — ABNORMAL LOW (ref 11.3–14.9)

## 2022-01-18 MED ADMIN — sodium ferric gluconate (FERRLECIT) 250 mg in sodium chloride (NS) 0.9 % 100 mL IVPB: 250 mg | INTRAVENOUS | @ 16:00:00 | Stop: 2022-01-18

## 2022-01-18 MED ADMIN — famotidine (PEPCID) tablet 20 mg: 20 mg | ORAL | @ 02:00:00

## 2022-01-18 MED ADMIN — acetaminophen (TYLENOL) tablet 1,000 mg: 1000 mg | ORAL | @ 02:00:00

## 2022-01-18 MED ADMIN — apixaban (ELIQUIS) tablet 2.5 mg: 2.5 mg | ORAL | @ 02:00:00 | Stop: 2022-01-18

## 2022-01-18 MED ADMIN — acetaminophen (TYLENOL) tablet 1,000 mg: 1000 mg | ORAL | @ 19:00:00

## 2022-01-18 MED ADMIN — cefdinir (OMNICEF) capsule 300 mg: 300 mg | ORAL | @ 02:00:00 | Stop: 2022-02-09

## 2022-01-18 MED ADMIN — predniSONE (DELTASONE) tablet 50 mg: 50 mg | ORAL | @ 15:00:00 | Stop: 2022-01-18

## 2022-01-18 MED ADMIN — oxyCODONE (ROXICODONE) immediate release tablet 10 mg: 10 mg | ORAL | @ 14:00:00 | Stop: 2022-01-18

## 2022-01-18 MED ADMIN — famotidine (PEPCID) tablet 40 mg: 40 mg | ORAL | @ 15:00:00 | Stop: 2022-01-18

## 2022-01-18 MED ADMIN — mirtazapine (REMERON) tablet 7.5 mg: 7.5 mg | ORAL | @ 02:00:00

## 2022-01-18 MED ADMIN — acetaminophen (TYLENOL) tablet 1,000 mg: 1000 mg | ORAL | @ 11:00:00

## 2022-01-18 MED ADMIN — diphenhydrAMINE (BENADRYL) capsule/tablet 50 mg: 50 mg | ORAL | @ 15:00:00 | Stop: 2022-01-18

## 2022-01-18 MED ADMIN — diphenhydrAMINE (BENADRYL) capsule/tablet 50 mg: 50 mg | ORAL | @ 02:00:00

## 2022-01-18 MED ADMIN — HYDROmorphone (PF) (DILAUDID) injection 0.5 mg: .5 mg | INTRAVENOUS | Stop: 2022-01-21

## 2022-01-18 MED ADMIN — baclofen (LIORESAL) tablet 10 mg: 10 mg | ORAL | @ 02:00:00

## 2022-01-18 MED ADMIN — OLANZapine zydis (ZyPREXA) disintegrating tablet 5 mg: 5 mg | ORAL | @ 02:00:00

## 2022-01-18 MED ADMIN — ammonium lactate (LAC-HYDRIN) 12 % lotion 1 application.: 1 | TOPICAL | @ 14:00:00

## 2022-01-18 MED ADMIN — ammonium lactate (LAC-HYDRIN) 12 % lotion 1 application.: 1 | TOPICAL | @ 02:00:00

## 2022-01-18 MED ADMIN — HYDROmorphone (PF) (DILAUDID) injection 0.5 mg: .5 mg | INTRAVENOUS | @ 11:00:00 | Stop: 2022-01-18

## 2022-01-18 MED ADMIN — baclofen (LIORESAL) tablet 10 mg: 10 mg | ORAL | @ 19:00:00

## 2022-01-18 MED ADMIN — SORAfenib (NexAVAR) tablet 200 mg **patient supplied**: 200 mg | ORAL | @ 14:00:00

## 2022-01-18 MED ADMIN — ondansetron (ZOFRAN) injection 4 mg: 4 mg | INTRAVENOUS | @ 06:00:00

## 2022-01-18 MED ADMIN — diphenhydrAMINE (BENADRYL) capsule/tablet 50 mg: 50 mg | ORAL | @ 02:00:00 | Stop: 2022-01-17

## 2022-01-18 MED ADMIN — gabapentin (NEURONTIN) capsule 200 mg: 200 mg | ORAL | @ 02:00:00

## 2022-01-18 MED ADMIN — magic mouthwash oral suspension (diphenhydrAMINE, nystatin) 10 mL: 10 mL | ORAL | @ 17:00:00

## 2022-01-18 MED ADMIN — Parenteral Nutrition (CENTRAL): INTRAVENOUS | @ 06:00:00 | Stop: 2022-01-19

## 2022-01-18 MED ADMIN — insulin lispro (HumaLOG) injection 0-20 Units: 0-20 [IU] | SUBCUTANEOUS | @ 16:00:00

## 2022-01-18 MED ADMIN — famotidine (PEPCID) tablet 40 mg: 40 mg | ORAL | @ 02:00:00 | Stop: 2022-01-17

## 2022-01-18 MED ADMIN — HYDROmorphone (PF) (DILAUDID) injection 0.5 mg: .5 mg | INTRAVENOUS | @ 05:00:00 | Stop: 2022-01-18

## 2022-01-18 MED ADMIN — HYDROmorphone (PF) (DILAUDID) injection 0.25 mg: .25 mg | INTRAVENOUS | @ 20:00:00 | Stop: 2022-01-21

## 2022-01-18 MED ADMIN — predniSONE (DELTASONE) tablet 50 mg: 50 mg | ORAL | @ 02:00:00 | Stop: 2022-01-17

## 2022-01-18 MED ADMIN — baclofen (LIORESAL) tablet 10 mg: 10 mg | ORAL | @ 11:00:00

## 2022-01-18 MED ADMIN — oxyCODONE (ROXICODONE) immediate release tablet 10 mg: 10 mg | ORAL | @ 02:00:00 | Stop: 2022-01-24

## 2022-01-18 MED ADMIN — HYDROmorphone (DILAUDID) tablet 3 mg: 3 mg | ORAL | @ 22:00:00 | Stop: 2022-01-24

## 2022-01-18 MED ADMIN — lidocaine 4 % patch 2 patch: 2 | TRANSDERMAL | @ 14:00:00

## 2022-01-18 MED ADMIN — cefdinir (OMNICEF) capsule 300 mg: 300 mg | ORAL | @ 14:00:00 | Stop: 2022-02-09

## 2022-01-18 MED ADMIN — HYDROmorphone (DILAUDID) tablet 3 mg: 3 mg | ORAL | @ 17:00:00 | Stop: 2022-01-24

## 2022-01-18 MED ADMIN — fat emulsion 20 % with fish oil (SMOFLIPID) infusion 250 mL: 250 mL | INTRAVENOUS | @ 06:00:00 | Stop: 2022-01-18

## 2022-01-18 MED ADMIN — sodium ferric gluconate (FERRLECIT) 250 mg in sodium chloride (NS) 0.9 % 100 mL IVPB: 250 mg | INTRAVENOUS | @ 04:00:00

## 2022-01-18 NOTE — Unmapped (Signed)
Department of Anesthesiology  Pain Medicine Division    Chronic Pain Followup Inpatient Consult Note    Requesting Attending Physician:  [name], MD  Service Requesting Consult:  [facility])    Assessment/Recommendations:  The patient was seen in consultation on request of Arman Filter, MD regarding assistance with pain management. The patient is not obtaining adequate pain relief on current medication regimen.      The patient is a 21 y.o. 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 She was also started on TPN this admission per prior outpatient plan.       Chronic pain team was consulted as patient is having acute on chronic pain.  Patient recently received bilateral celiac plexus blocks on November 1 as an outpatient.  Patient reported having right-sided flank and right lower quadrant pain that was thought to be muscle spasms.  Patient was sent to the ER by [name] for possible evaluation of a perforation.  Imaging in the ED was negative for any findings.  In the past patient has had a history of having significant pain flares after trigger point injections that continues for 1 day.  She has used Toradol before which does help with pain relief.       We discussed continuing with the current management of pain.  She is progressing well in her hospital course, and is weaning off IV opioids as her ketamine course is finishing.  She is scheduled for an endoscopy on 11/13.    Interval: The patient endorsed a sharp lower abdominal pain after a bloody bowel movement overnight, and is feeling rough this morning.  She states that her pain is somewhat controlled, and remains anxious about medication changes and hospital course.  We discussed de-escalating dosage of hydromorphone with the goal of discontinuation as the ketamine infusion finishes its course within the next 2 days.  After discussion with the patient and her mother, we agreed that an opioid rotation to oral hydromorphone from oral oxycodone can be trialed as this may have been more beneficial in the past; though, she did note more sedation with oral hydromorphone and was concurrently taking oxycodone.  We discussed that only one type of oral opioid would be ordered for today and that she would have the option of transitioning back to her oral oxycodone if appropriate analgesic benefit was not achieved with this change.  Dr. Desma Maxim will consider ordering a hydrocortisone suppository for her.  The patient and her mother were amenable to the plan discussed.    Of note, there is no evidence of hepatic or renal dysfunction on laboratory studies.    Recommendations:  -The chronic pain service is a consult service and does not place orders, just makes recommendations (except ketamine and lidocaine infusions)   -Please evaluate all patients on opioids for appropriateness of prescribing narcan at discharge.  The chronic pain service can assist with this.  Nasal narcan is covered by most insurances.  -Recommendations given apply to the current hospitalization and do not reflect long term recommendations.    Changes to current analgesic regimen:  -Perform an opioid rotation from oral oxycodone to oral hydromorphone to reflect these changes: Hydromorphone 3 mg oral tablet every 4 hours as needed for moderate to severe pain (it is appropriate to return to her prior oxycodone regimen as needed if  she feels insufficient analgesic benefit from the rotation; please do not have orders for both oral oxycodone and oral hydromorphone)  -Decrease the dose of hydromorphone IV to reflect: Hydromorphone 0.25 mg IV every 6 hours as needed (discussed that we would continue weaning either the dose or frequency with the goal of hydromorphone IV discontinuation on 11/13 as the ketamine infusion is discontinued)    Continue without changes:  -Continue acetaminophen 1000 mg every 8 hours scheduled  -Continue Voltaren 1% gel up to 4 times per day as needed for arthritic and muscular pain  -Continue baclofen 10 mg oral tablet every 8 hours scheduled  -Continue gabapentin 200 oral capsule every night scheduled  -Continue lidocaine 4% patch for 12 hours a day on areas of pain  - Continue ketamine infusion at 0.13 mg/kg/hr (Day 5 out of 7 -we will discontinue either evening of 11/12 or morning of 11/13)         Ketamine Infusion Order                    Frequency     ketamine (KETALAR) 10 mg/mL infusion  (ADULTS:  Medications + Nursing Interventions)        Question Answer Comment   Titrate Medication? Do not Titrate    Call MD if hemodynamic instability        Continuous        -Patient is on a low dose ketamine infusion for pain   -All ketamine orders will be managed by the [facility] Chronic Pain Service only-orders can only be placed by attendings and fellows   -Ketamine requires dedicated IV (PIV or central line lumen)   -Continue ketamine infusion: 0.13mg /kg/hr Actual body weight   -Patient location: floor (can decrease anytime)   -Day 4 (0-7 days). Started (date): 01/13/22   -Prior to starting: Recent EKG, CMP (creatinine, LFTs), urine pregnancy test (if applicable)   -Daily CMP (creatinine and LFTs daily) while running   -Lorazepam is not available PRN for agitation/hallucinations only related to ketamine infusion   -Glycopyrrolate is not available PRN for salivation   -Zofran PRN is available for nausea   -Please contact the Chronic Pain service with any questions or concerns about ketamine infusion.      We will continue to follow.    Naloxone Rx at discharge?  Is patient on opioids? Yes.  1)Is dose >50MME?  Yes.  2) Is patient prescribed a benzodiazepine (w opioids)? No.  3)Hx of overdose?  No.  4) Hx of substance use disorder? No.  5) Opioids likely to last greater than a week after discharge? Yes.     If yes to 2 or more, prescribe naloxone at discharge.  Nasal narcan for most insured (Nasal narcan 4mg /actuation, prescribe 1 kit, instructions at [email or web address]).  For uninsured, chronic pain can work to assist in finding an option.  OTC nasal narcan now available at most pharmacies for around $45.    Interim History  There were no Acute Events Overnight.  [name] [name] stated that she was feeling a little rough this morning, and that she has sharp lower abdominal pain after a bowel movement that happened overnight.  Dr. Desma Maxim and Dr. Eustaquio Maize were present during this encounter.  Her mother inquired about a hydrocortisone suppository to assist with ulcerative pain in her abdomen.    Analgesia Evaluation:  Pain at minimum:  6/10  Pain at maximum: 7/10    Current pain medication regimen (including how frequent PRN's were used):  -  Gabapentin 200 mg at night  -Baclofen 10 mg 3 times daily  -Oxycodone 10 mg every 4 hours as needed x3  -Dilaudid 3 mg p.o. nightly   - Dilaudid 0.5 mg IV q6h x4  -Diclofenac gel as needed  - Ketamine gtt        Inpatient Medications  Current Facility-Administered Medications   Medication Dose Route Frequency Provider Last Rate Last Admin    acetaminophen (TYLENOL) tablet 1,000 mg  1,000 mg Oral Q8H Kateri Plummer, MD   1,000 mg at 01/18/22 0614    ammonium lactate (LAC-HYDRIN) 12 % lotion 1 application.  1 application. Topical BID [name], MD   1 application. at 01/17/22 2125    baclofen (LIORESAL) tablet 10 mg  10 mg Oral Q8H Kateri Plummer, MD   10 mg at 01/18/22 8469    cefdinir (OMNICEF) capsule 300 mg  300 mg Oral BID Kateri Plummer, MD   300 mg at 01/18/22 0910    dextrose (D10W) 10% bolus 125 mL  12.5 g Intravenous Q10 Min PRN [name], MD        diclofenac sodium (VOLTAREN) 1 % gel 2 g  2 g Topical QID PRN Kateri Plummer, MD        diphenhydrAMINE (BENADRYL) capsule/tablet 50 mg  50 mg Oral Q6H PRN Arman Filter, MD   50 mg at 01/17/22 2126    [START ON 01/19/2022] famotidine (PEPCID) tablet 20 mg  20 mg Oral Nightly [name], MD        Parenteral Nutrition (CENTRAL)   Intravenous Continuous [name], MD 80 mL/hr at 01/18/22 0036 New Bag at 01/18/22 0036    And    fat emulsion 20 % with fish oil (SMOFLIPID) infusion 250 mL  250 mL Intravenous Continuous [name], MD 20.8 mL/hr at 01/18/22 0037 250 mL at 01/18/22 0037    [START ON 01/19/2022] Parenteral Nutrition (CENTRAL)   Intravenous Continuous [name], MD        And    [START ON 01/19/2022] fat emulsion 20 % with fish oil (SMOFLIPID) infusion 250 mL  250 mL Intravenous Continuous [name], MD        gabapentin (NEURONTIN) capsule 200 mg  200 mg Oral Nightly Kateri Plummer, MD   200 mg at 01/17/22 2127    glucagon injection 1 mg  1 mg Intramuscular Once PRN [name], MD        glucose chewable tablet 16 g  16 g Oral Q10 Min PRN [name], MD        hydrocortisone (ANUSOL-HC) suppository 25 mg  25 mg Rectal BID PRN [name], MD        hydrocortisone 1 % cream   Topical BID PRN Dorise Hiss, ACNP        HYDROmorphone (DILAUDID) tablet 3 mg  3 mg Oral Q4H PRN [name], MD        HYDROmorphone (PF) (DILAUDID) injection 0.25 mg  0.25 mg Intravenous Q6H PRN [name], MD        hydrOXYzine (ATARAX) tablet 50 mg  50 mg Oral Q6H PRN Arman Filter, MD        hyoscyamine (LEVSIN) tablet 0.125 mg  0.125 mg Oral Q4H PRN Kateri Plummer, MD        insulin lispro (HumaLOG) injection 0-20 Units  0-20 Units Subcutaneous Q6H [facility] Desma Maxim, [place]  Dewayne Hatch, MD   2 Units at 01/17/22 1321    ketamine (KETALAR) 10 mg/mL infusion  0.13 mg/kg/hr Intravenous Continuous Gilmore Laroche, MD 0.7 mL/hr at 01/17/22 1034 0.13 mg/kg/hr at 01/17/22 1034    lidocaine 4 % patch 2 patch  2 patch Transdermal Daily Kateri Plummer, MD   2 patch at 01/18/22 1610    magic mouthwash oral suspension (diphenhydrAMINE, nystatin) 10 mL  10 mL Oral QID PRN [name], MD   10 mL at 01/17/22 1759    melatonin tablet 3 mg  3 mg Oral Nightly PRN Kateri Plummer, MD        mirtazapine (REMERON) tablet 7.5 mg  7.5 mg Oral Nightly Kateri Plummer, MD   7.5 mg at 01/17/22 2127    naloxone [facility]) injection 0.1 mg  0.1 mg Intravenous Q5 Min PRN Kateri Plummer, MD        OLANZapine zydis (ZyPREXA) disintegrating tablet 5 mg  5 mg Oral Nightly Kateri Plummer, MD   5 mg at 01/17/22 2127    ondansetron (ZOFRAN-ODT) disintegrating tablet 4 mg  4 mg Oral Q6H PRN [name], MD        Or    ondansetron [facility]) injection 4 mg  4 mg Intravenous Q6H PRN [name], MD   4 mg at 01/18/22 0053    senna (SENOKOT) tablet 2 tablet  2 tablet Oral Nightly PRN Kateri Plummer, MD        simethicone [facility]) chewable tablet 160 mg  160 mg Oral QID PRN Arman Filter, MD   160 mg at 01/13/22 0958    sodium chloride (NS) 0.9 % infusion  10 mL/hr Intravenous Continuous Gilmore Laroche, MD 10 mL/hr at 01/13/22 1342 10 mL/hr at 01/13/22 1342    sodium ferric gluconate (FERRLECIT) 250 mg in sodium chloride (NS) 0.9 % 100 mL IVPB  250 mg Intravenous Once [name], MD        SORAfenib (NexAVAR) tablet 200 mg **patient supplied**  200 mg Oral Daily Sanoff, Mabeline Caras, MD   200 mg at 01/18/22 0910    tacrolimus (PROTOPIC) 0.1 % ointment 1 application.  1 application. Topical BID PRN [name], MD        triamcinolone (KENALOG) 0.1 % ointment   Topical BID PRN Kateri Plummer, MD   Given at 01/15/22 1215         Objective:     Vital Signs    Temp:  [35.9 ??C (96.7 ??F)-36.6 ??C (97.8 ??F)] 36.6 ??C (97.8 ??F)  Heart Rate:  [72-97] 97  Resp:  [16-18] 18  BP: (119-130)/(60-68) 123/64  MAP (mmHg):  [81-91] 81  SpO2:  [96 %-99 %] 96 %      Physical Exam    GENERAL:  Well developed, well-nourished female and is in no apparent distress. HEAD/NECK:    Reveals normocephalic/atraumatic.   CARDIOVASCULAR:   Warm, well perfused  LUNGS:   Normal work of breathing  EXTREMITIES:  Warm, no clubbing, cyanosis, or edema was noted.   ABDOMEN:  tender to deep palpation  NEUROLOGIC:    The patient was alert and oriented times four with normal language, attention, cognition and memory. Cranial nerve exam was grossly normal.  Sensation Intact to light touch throughout the bilateral upper and lower extremities.  MUSCULOSKELETAL:    Motor function  5/5 in upper and lower extremities.    SKIN:  No obvious rashes lesions  or erythema  PSY:  Appropriate affect and mood.    Test Results    Lab Results   Component Value Date    CREATININE 0.37 (L) 01/18/2022     Lab Results   Component Value Date    ALKPHOS 39 (L) 01/18/2022    BILITOT 0.3 01/18/2022    BILIDIR 0.20 12/04/2021    PROT 5.1 (L) 01/18/2022    ALBUMIN 2.9 (L) 01/18/2022    ALT 18 01/18/2022    AST 20 01/18/2022           Problem List    Principal Problem:    Intractable abdominal pain  Active Problems:    [name]    [name]    Dehydration    Severe protein-calorie malnutrition (CMS-HCC)

## 2022-01-18 NOTE — Unmapped (Signed)
Informed Blood Consent    I have advised [name] [name] of her medical condition, and that the chances for her improvement or recovery will be significantly helped by receiving blood products by transfusion:  Such as packed red blood cells, fresh frozen plasma, platelets or cryoprecipitate (blood products).    I have explained the benefits that are expected from the patient being transfused and, as well, the risk.  The patient understands that although the blood products to be administered had been prepared and tested in accordance with strict scientific rules established by the American [employer] of Blood Banks, there is still a very small - approximately 1 and 70,000 for Hemolytic Reactions and approximately 1 and 190,000 for TRALI - chance of blood products could be incompatible with the patient's body and a transfusion reaction can occur.  Although transfusion reactions can be treated successfully, the patient understands that on rare occasions they can be fatal (1 in 250,000 transfusions).  The patient also understands that allergic reactions to blood products with hives, itching, and fever are more common but can be treated and may not even require the transfusion to be stopped.  The patient understands that even with testing by the most up-to-date methods, there is a small chance that the blood products may contain a virus that may not be recognized as an infection for many months or years.  Even with proper testing, I discussed with the patient that per the [employer], the chance for contracting viral Hepatitis B is approximately 1 in 300,000, Hepatitis C is approximately 1 in 1.5 million, and HIV is approximately 1 in 2 million.    I gave the patient an opportunity to ask questions regarding the transfusion of blood products and I answered those questions to the patient's satisfaction.    ______________________________________________________________________  Instructions for Nursing:  Nurse will indicate at the top of the consent form (page 1) the portions in BOLD:  1.  I authorize Dr. Jerrye Bushy, MD and/or associates and assistants of his/her choice at [facility] (referred to herein as 'facility') to perform the following procedure(s): Transfusion of Blood Products.    The patient should place her initials in the area under #4 indicating Authorization of blood products.    The patient's signature (page 2) will be obtained by nursing staff on the consent form and the nurse will verify this consent under the Witness Certification (page 2).  The paper form will subsequently be placed in the patient's physical chart (later to be scanned) as further evidence that the patient has given consent to the administration of blood products.

## 2022-01-18 NOTE — Unmapped (Signed)
Hospital Medicine Daily Progress Note    Assessment/Plan:    Principal Problem:    Intractable abdominal pain  Active Problems:    [name]    [name]    Dehydration    Severe protein-calorie malnutrition (CMS-HCC)  Resolved Problems:    * No resolved hospital problems. *   Malnutrition Evaluation as performed by RD, LDN: Severe Protein-Calorie Malnutrition in the context of chronic illness (01/17/22 1131)             [name] [name] is a 21 y.o. 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 She was also started on TPN this admission per prior outpatient plan.      Chronic iron deficiency. Acute on chronic blood losses  Follows with Hematology. Attributed to menstrual bleeding, impaired absorption from abdominal disease, and chronic GI bleeding. Had infusion reaction to sodium ferric gluconate in 04/2021 requiring ED visit for steroids and Benadryl. Per outpatient heme, can consider doing re-challenge with premedications per [facility] protocol. Ferritin 13, fe 33, fe sat 12%.  - GI recs EGD/pouchoscopy Mon. Holding eliquis starting Sat given active bleeding  - Protocol GIB mgmt  - Hydrocort PR per GI recs  - IV iron repletion, anticipate x5 doses   - [facility] premedication protocol for ferric gluconate challenge:   - Prednisone 50mg  at 13h, 7h, and 1h prior to infusion.  - Benadryl 50mg  at 1h prior to infusion  - Pepcid 40mg , 1h prior to infusion  - Tylenol 650mg   - Discussed w/ pharmacist and will continue w/ q12h IV iron infusions w/ pre-meds only at 1 hr    Acute on chronic abdominal pain  Abdominal desmoid tumors  Patient went for celiac plexus block on 01/08/2022 with [name] as an outpatient and presented with acute right flank and RLQ pain. Per outpatient notes, [name] feels her acute pain response is most consistent with muscle spasms and referred pain from the spasms. CT showed small locule of gas in the left perirenal tract, likely post-procedural from block, but no renal or psoas injury or other acute findings. Patient continues to have severe pain requiring IV hydromorphone q3 hrs. Case discussed with Chronic Pain today 11/5; ketamine drip was considered but deferred given patient's report of irritability/agitation today and uncertain potential drug-drug interaction with her home sorafenib.   - Chronic Pain consulted, appreciate recs, day 6 of ketamine infusion  - Continue home regimen with scheduled tylenol 1000 mg q8h, diclofenac, lidocaine patch  - Switch oxycodone 10 q4 PRN to hydromorphone 3 q4 PRN  - Wean hydromorphone 0.5-->0.25mg  iv q6h PRN severe pain    Severe malnutrition in setting of [name] and abdominal desmoid tumors  Patient has been on TPN twice before, administered through PICC lines which then were ultimately removed and central lines placed (at least once due to clot). She was recently discharged with a corpak and plan for tube feeds at home, but continued to lose weight and have difficulty with pain control and nausea. She was seen recently by Dr. [name] and her outpatient dietitian with plans to resume TPN and had waiting for a direct admission to do. Tunneled line placed 11/3 and she was started on TPN on 11/3. NG tube removed 11/4 due to no plans to continue tube feeds.   - Continue TPN daily. Dr. [name] has agreed to follow  TPN orders/labs after discharge.    - Appreciate CM assistance with referral for home infusion  - VTE ppx per Heme recs while central line in place - apixaban 2.5 bid (held for GIB)     [name]/Desmoid tumors. 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 Mucositis mixture and ammonium for hydration and symptomatic relief    Anxiety. Continue zyprexa 2.5qhs, Atarax 25 q6prn, Remeron 7.5 at bedtime    Irritant contact dermatitis to adhesive of Duoderm on cheek with NG  Saw dermatology as outpatient. Awaiting PA for elidel. Triamcinolone PRN, Benadryl PRN     FEN: Regular diet by mouth for comfort/as tolerated. TPN per dietician recommendations.   VTE ppx: Eliquis 2.5 mg BID   HCDM: Graefe,[name] - Mother - [phone]  HCDM, back-up: [name] - Father - [phone]    I personally spent 50 minutes face-to-face and non-face-to-face in the care of this patient, which includes all pre, intra, and post visit time on the date of service.  All documented time was specific to the E/M visit and does not include any procedures that may have been performed.    ___________________________________________________________________    Subjective:  Per XC 7pm: Hematochezia (not new) but followed by lightheadedness, fatigue.  BP, HR were fine.  Saw in person, not tachycardic, good strong 2+ pulses.  Made no changes, just provided some reassurance    Ferrlecit 1025, 2235 tolerating well  Feeling weaker and agreeable to possible transfusion, consent placed on chart after discussion including w/ Mom in room    PRNs/24h:   HM iv 1315, 1913, 0027, 0615  Zofran iv 0643, 1315, 0053  Oxy po 0937, 1635, 2127    Rounded w/ Chronic Pain team again    Labs/Studies:  Labs and Studies from the last 24hrs per EMR and Reviewed    Hgb continues to downtrend over past 4 days  11.7>10.9>10.1>9.9>8.5 w/ ongoing hematochezia  DOAC held after this AM dose per GI recs    Objective:  Temp:  [35.9 ??C (96.7 ??F)-36.6 ??C (97.8 ??F)] 36.6 ??C (97.8 ??F)  Heart Rate:  [72-97] 97  Resp:  [16-18] 18  BP: (118-130)/(60-68) 123/64  SpO2:  [96 %-99 %] 96 %  Gen: Appears fatigued but alert and fully interactive, sitting up in bed, Mom at bedside  ENT: MMM. Tunneled line in R anterior chest wall  Pulm: CTAB, normal WOB  Abd: NABS, soft, mild tend, nondist  Ext: WWP, no edema or cyanosis  Neuro: A&O x 3, no focal deficits  Psych: Appropriate mood and affect

## 2022-01-18 NOTE — Unmapped (Signed)
Alert and oriented x 4. Mother at bedside. Double lumen CVC to the RCW, flushing well with good blood return for both lumens, caps changed, CDI dressing noted. Pain level 7 out of 10 to her abdomen. Medicated with PRN pain medication per MD orders. Call bell within reach.    Problem: Adult Inpatient Plan of Care  Goal: Plan of Care Review  Outcome: Progressing  Goal: Patient-Specific Goal (Individualized)  Outcome: Progressing  Goal: Absence of Hospital-Acquired Illness or Injury  Outcome: Progressing  Intervention: Identify and Manage Fall Risk  Recent Flowsheet Documentation  Taken 01/18/2022 0400 by Marguerite Olea, RN  Safety Interventions: environmental modification  Taken 01/18/2022 0200 by Marguerite Olea, RN  Safety Interventions: environmental modification  Taken 01/18/2022 0000 by Marguerite Olea, RN  Safety Interventions: environmental modification  Taken 01/17/2022 2000 by Marguerite Olea, RN  Safety Interventions: environmental modification  Intervention: Prevent Skin Injury  Recent Flowsheet Documentation  Taken 01/18/2022 0400 by Marguerite Olea, RN  Positioning for Skin: Supine/Back  Taken 01/18/2022 0200 by Marguerite Olea, RN  Positioning for Skin: Supine/Back  Taken 01/18/2022 0000 by Marguerite Olea, RN  Positioning for Skin: Other (Comment)  Taken 01/17/2022 2200 by Marguerite Olea, RN  Positioning for Skin: Supine/Back  Taken 01/17/2022 2045 by Marguerite Olea, RN  Positioning for Skin: Supine/Back  Device Skin Pressure Protection: adhesive use limited  Skin Protection: adhesive use limited  Taken 01/17/2022 2000 by Marguerite Olea, RN  Positioning for Skin: Supine/Back  Device Skin Pressure Protection: adhesive use limited  Skin Protection: adhesive use limited  Intervention: Prevent Infection  Recent Flowsheet Documentation  Taken 01/17/2022 2000 by Marguerite Olea, RN  Infection Prevention:   hand hygiene promoted   personal protective equipment utilized  Goal: Optimal Comfort and Wellbeing  Outcome: Progressing  Goal: Readiness for Transition of Care  Outcome: Progressing  Goal: Rounds/Family Conference  Outcome: Progressing     Problem: Fall Injury Risk  Goal: Absence of Fall and Fall-Related Injury  Outcome: Progressing  Intervention: Promote Injury-Free Environment  Recent Flowsheet Documentation  Taken 01/18/2022 0400 by Marguerite Olea, RN  Safety Interventions: environmental modification  Taken 01/18/2022 0200 by Marguerite Olea, RN  Safety Interventions: environmental modification  Taken 01/18/2022 0000 by Marguerite Olea, RN  Safety Interventions: environmental modification  Taken 01/17/2022 2000 by Marguerite Olea, RN  Safety Interventions: environmental modification     Problem: Malnutrition  Goal: Improved Nutritional Intake  Outcome: Progressing     Problem: Self-Care Deficit  Goal: Improved Ability to Complete Activities of Daily Living  Outcome: Progressing     Problem: Latex Allergy  Goal: Absence of Allergy Symptoms  Outcome: Progressing

## 2022-01-19 LAB — PHOSPHORUS: PHOSPHORUS: 3.3 mg/dL (ref 2.4–5.1)

## 2022-01-19 LAB — BASIC METABOLIC PANEL
ANION GAP: 4 mmol/L — ABNORMAL LOW (ref 5–14)
BLOOD UREA NITROGEN: 12 mg/dL (ref 9–23)
BUN / CREAT RATIO: 32
CALCIUM: 8.1 mg/dL — ABNORMAL LOW (ref 8.7–10.4)
CHLORIDE: 111 mmol/L — ABNORMAL HIGH (ref 98–107)
CO2: 28 mmol/L (ref 20.0–31.0)
CREATININE: 0.37 mg/dL — ABNORMAL LOW
EGFR CKD-EPI (2021) FEMALE: >90 mL/min/{1.73_m2} (ref >=60)
GLUCOSE RANDOM: 172 mg/dL (ref 70–179)
POTASSIUM: 4.3 mmol/L (ref 3.4–4.8)
SODIUM: 143 mmol/L (ref 135–145)

## 2022-01-19 LAB — CBC
HEMATOCRIT: 24.5 % — ABNORMAL LOW (ref 34.0–44.0)
HEMOGLOBIN: 8.3 g/dL — ABNORMAL LOW (ref 11.3–14.9)
MEAN CORPUSCULAR HEMOGLOBIN CONC: 33.8 g/dL (ref 32.0–36.0)
MEAN CORPUSCULAR HEMOGLOBIN: 29.3 pg (ref 25.9–32.4)
MEAN CORPUSCULAR VOLUME: 86.7 fL (ref 77.6–95.7)
MEAN PLATELET VOLUME: 10.2 fL (ref 6.8–10.7)
PLATELET COUNT: 186 10*9/L (ref 150–450)
RED BLOOD CELL COUNT: 2.83 10*12/L — ABNORMAL LOW (ref 3.95–5.13)
RED CELL DISTRIBUTION WIDTH: 12.6 % (ref 12.2–15.2)
WBC ADJUSTED: 16.1 10*9/L — ABNORMAL HIGH (ref 3.6–11.2)

## 2022-01-19 LAB — HEMOGLOBIN: HEMOGLOBIN: 8.7 g/dL — ABNORMAL LOW (ref 11.3–14.9)

## 2022-01-19 LAB — MAGNESIUM: MAGNESIUM: 2.5 mg/dL (ref 1.6–2.6)

## 2022-01-19 MED ADMIN — famotidine (PEPCID) tablet 40 mg: 40 mg | ORAL | @ 03:00:00 | Stop: 2022-01-18

## 2022-01-19 MED ADMIN — diphenhydrAMINE (BENADRYL) capsule/tablet 50 mg: 50 mg | ORAL | @ 03:00:00 | Stop: 2022-01-18

## 2022-01-19 MED ADMIN — triamcinolone (KENALOG) 0.1 % ointment: TOPICAL | @ 15:00:00

## 2022-01-19 MED ADMIN — acetaminophen (TYLENOL) tablet 1,000 mg: 1000 mg | ORAL | @ 03:00:00

## 2022-01-19 MED ADMIN — ketamine (KETALAR) 10 mg/mL infusion: .13 mg/kg/h | INTRAVENOUS | @ 22:00:00

## 2022-01-19 MED ADMIN — diphenhydrAMINE (BENADRYL) capsule/tablet 50 mg: 50 mg | ORAL | @ 15:00:00 | Stop: 2022-01-19

## 2022-01-19 MED ADMIN — diphenhydrAMINE (BENADRYL) capsule/tablet 50 mg: 50 mg | ORAL | @ 03:00:00

## 2022-01-19 MED ADMIN — baclofen (LIORESAL) tablet 10 mg: 10 mg | ORAL | @ 19:00:00

## 2022-01-19 MED ADMIN — predniSONE (DELTASONE) tablet 50 mg: 50 mg | ORAL | @ 15:00:00 | Stop: 2022-01-19

## 2022-01-19 MED ADMIN — ondansetron (ZOFRAN) injection 4 mg: 4 mg | INTRAVENOUS | @ 03:00:00

## 2022-01-19 MED ADMIN — ammonium lactate (LAC-HYDRIN) 12 % lotion 1 application.: 1 | TOPICAL | @ 03:00:00

## 2022-01-19 MED ADMIN — cefdinir (OMNICEF) capsule 300 mg: 300 mg | ORAL | @ 03:00:00 | Stop: 2022-02-09

## 2022-01-19 MED ADMIN — gabapentin (NEURONTIN) capsule 200 mg: 200 mg | ORAL | @ 03:00:00

## 2022-01-19 MED ADMIN — sodium ferric gluconate (FERRLECIT) 250 mg in sodium chloride (NS) 0.9 % 100 mL IVPB: 250 mg | INTRAVENOUS | @ 16:00:00 | Stop: 2022-01-19

## 2022-01-19 MED ADMIN — HYDROmorphone (DILAUDID) tablet 3 mg: 3 mg | ORAL | @ 19:00:00 | Stop: 2022-01-24

## 2022-01-19 MED ADMIN — HYDROmorphone (PF) (DILAUDID) injection 0.25 mg: .25 mg | INTRAVENOUS | @ 11:00:00 | Stop: 2022-01-19

## 2022-01-19 MED ADMIN — ondansetron (ZOFRAN) injection 4 mg: 4 mg | INTRAVENOUS

## 2022-01-19 MED ADMIN — famotidine (PEPCID) tablet 40 mg: 40 mg | ORAL | @ 15:00:00 | Stop: 2022-01-19

## 2022-01-19 MED ADMIN — SORAfenib (NexAVAR) tablet 200 mg **patient supplied**: 200 mg | ORAL | @ 15:00:00

## 2022-01-19 MED ADMIN — HYDROmorphone (PF) (DILAUDID) injection 0.15 mg: .15 mg | INTRAVENOUS | @ 18:00:00 | Stop: 2022-01-20

## 2022-01-19 MED ADMIN — OLANZapine zydis (ZyPREXA) disintegrating tablet 5 mg: 5 mg | ORAL | @ 03:00:00

## 2022-01-19 MED ADMIN — Parenteral Nutrition (CENTRAL): INTRAVENOUS | @ 05:00:00 | Stop: 2022-01-20

## 2022-01-19 MED ADMIN — predniSONE (DELTASONE) tablet 50 mg: 50 mg | ORAL | @ 03:00:00 | Stop: 2022-01-18

## 2022-01-19 MED ADMIN — HYDROmorphone (DILAUDID) tablet 3 mg: 3 mg | ORAL | @ 15:00:00 | Stop: 2022-01-24

## 2022-01-19 MED ADMIN — ondansetron (ZOFRAN) injection 4 mg: 4 mg | INTRAVENOUS | @ 18:00:00

## 2022-01-19 MED ADMIN — sodium ferric gluconate (FERRLECIT) 250 mg in sodium chloride (NS) 0.9 % 100 mL IVPB: 250 mg | INTRAVENOUS | @ 04:00:00

## 2022-01-19 MED ADMIN — acetaminophen (TYLENOL) tablet 1,000 mg: 1000 mg | ORAL | @ 19:00:00

## 2022-01-19 MED ADMIN — insulin lispro (HumaLOG) injection 0-20 Units: 0-20 [IU] | SUBCUTANEOUS | @ 11:00:00 | Stop: 2022-01-19

## 2022-01-19 MED ADMIN — HYDROmorphone (DILAUDID) tablet 3 mg: 3 mg | ORAL | @ 23:00:00 | Stop: 2022-01-24

## 2022-01-19 MED ADMIN — hydrocortisone 1 % cream: TOPICAL | @ 15:00:00

## 2022-01-19 MED ADMIN — mirtazapine (REMERON) tablet 7.5 mg: 7.5 mg | ORAL | @ 03:00:00

## 2022-01-19 MED ADMIN — ammonium lactate (LAC-HYDRIN) 12 % lotion 1 application.: 1 | TOPICAL | @ 15:00:00

## 2022-01-19 MED ADMIN — cefdinir (OMNICEF) capsule 300 mg: 300 mg | ORAL | @ 15:00:00 | Stop: 2022-02-09

## 2022-01-19 MED ADMIN — HYDROmorphone (PF) (DILAUDID) injection 0.15 mg: .15 mg | INTRAVENOUS | Stop: 2022-01-20

## 2022-01-19 MED ADMIN — HYDROmorphone (DILAUDID) tablet 3 mg: 3 mg | ORAL | @ 04:00:00 | Stop: 2022-01-24

## 2022-01-19 MED ADMIN — acetaminophen (TYLENOL) tablet 1,000 mg: 1000 mg | ORAL | @ 11:00:00

## 2022-01-19 MED ADMIN — HYDROmorphone (PF) (DILAUDID) injection 0.25 mg: .25 mg | INTRAVENOUS | @ 03:00:00 | Stop: 2022-01-21

## 2022-01-19 MED ADMIN — baclofen (LIORESAL) tablet 10 mg: 10 mg | ORAL | @ 03:00:00

## 2022-01-19 MED ADMIN — lidocaine 4 % patch 2 patch: 2 | TRANSDERMAL | @ 15:00:00

## 2022-01-19 MED ADMIN — fat emulsion 20 % with fish oil (SMOFLIPID) infusion 250 mL: 250 mL | INTRAVENOUS | @ 06:00:00 | Stop: 2022-01-19

## 2022-01-19 MED ADMIN — polyethylene glycol (MIRALAX) packet 119 g: 119 g | ORAL | @ 19:00:00 | Stop: 2022-01-19

## 2022-01-19 MED ADMIN — baclofen (LIORESAL) tablet 10 mg: 10 mg | ORAL | @ 11:00:00

## 2022-01-19 MED ADMIN — ondansetron (ZOFRAN) injection 4 mg: 4 mg | INTRAVENOUS | @ 11:00:00

## 2022-01-19 NOTE — Unmapped (Signed)
Patient received her iron infusion this am. Premedications given, see mar. No sign/symptoms of iron reactions. PRN pain/nausea medication given. GI prep started at 1400. On clear diet. Patient ambulated in hallways with family this shift. Patient does report feeling weak and little energy. Will reck hemoglobin at 1600. Continue patient care.   Problem: Adult Inpatient Plan of Care  Goal: Plan of Care Review  Outcome: Progressing  Goal: Patient-Specific Goal (Individualized)  Outcome: Progressing  Goal: Absence of Hospital-Acquired Illness or Injury  Outcome: Progressing  Intervention: Identify and Manage Fall Risk  Recent Flowsheet Documentation  Taken 01/19/2022 1200 by Aggie Cosier, RN  Safety Interventions:   environmental modification   fall reduction program maintained   family at bedside   lighting adjusted for tasks/safety   low bed   nonskid shoes/slippers when out of bed  Intervention: Prevent Skin Injury  Recent Flowsheet Documentation  Taken 01/19/2022 1400 by Aggie Cosier, RN  Positioning for Skin: Supine/Back  Taken 01/19/2022 1200 by Aggie Cosier, RN  Positioning for Skin: Supine/Back  Goal: Optimal Comfort and Wellbeing  Outcome: Progressing  Goal: Readiness for Transition of Care  Outcome: Progressing  Goal: Rounds/Family Conference  Outcome: Progressing     Problem: Fall Injury Risk  Goal: Absence of Fall and Fall-Related Injury  Outcome: Progressing  Intervention: Promote Injury-Free Environment  Recent Flowsheet Documentation  Taken 01/19/2022 1200 by Aggie Cosier, RN  Safety Interventions:   environmental modification   fall reduction program maintained   family at bedside   lighting adjusted for tasks/safety   low bed   nonskid shoes/slippers when out of bed     Problem: Malnutrition  Goal: Improved Nutritional Intake  Outcome: Progressing     Problem: Self-Care Deficit  Goal: Improved Ability to Complete Activities of Daily Living  Outcome: Progressing     Problem: Latex Allergy  Goal: Absence of Allergy Symptoms  Outcome: Progressing

## 2022-01-19 NOTE — Unmapped (Signed)
Department of Anesthesiology  Pain Medicine Division    Chronic Pain Followup Inpatient Consult Note    Requesting Attending Physician:  [name], MD  Service Requesting Consult:  Med Bernita Rais[facility])    Assessment/Recommendations:  The patient was seen in consultation on request of Arman Filter, MD regarding assistance with pain management. The patient is not obtaining adequate pain relief on current medication regimen.      The patient is a 21 y.o. 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 She was also started on TPN this admission per prior outpatient plan.       Chronic pain team was consulted as patient is having acute on chronic pain.  Patient recently received bilateral celiac plexus blocks on November 1 as an outpatient.  Patient reported having right-sided flank and right lower quadrant pain that was thought to be muscle spasms.  Patient was sent to the ER by [name] for possible evaluation of a perforation.  Imaging in the ED was negative for any findings.  In the past patient has had a history of having significant pain flares after trigger point injections that continues for 1 day.  She has used Toradol before which does help with pain relief.       We discussed continuing with the current management of pain.  She is progressing well in her hospital course, and is weaning off IV opioids as her ketamine course is finishing.     Interval: The patient had improvements in analgesia from oral opioid rotation to hydromorphone, tolerated well from a side effect profile.  Counseled her on usage of oral opioids at an increased frequency if needed, and can try joint conclusion in reducing her IV hydromorphone usage by 50% today with the same frequency and continued goal of discontinuation as the ketamine infusion is discontinued.  She is scheduled for an endoscopy on 11/13.    Of note, there is no evidence of hepatic or renal dysfunction on laboratory studies.    Recommendations:  -The chronic pain service is a consult service and does not place orders, just makes recommendations (except ketamine and lidocaine infusions)   -Please evaluate all patients on opioids for appropriateness of prescribing narcan at discharge.  The chronic pain service can assist with this.  Nasal narcan is covered by most insurances.  -Recommendations given apply to the current hospitalization and do not reflect long term recommendations.    Changes to current analgesic regimen:  -Decrease the dose of hydromorphone IV to reflect: Hydromorphone 0.125 mg IV every 6 hours as needed (counseled patient on the goal of of hydromorphone IV discontinuation on 11/13 as the ketamine infusion is discontinued)  -Please order a CMP instead of a BMP on 11/13 to monitor liver function given that the patient is on ketamine  -Consider addition of simethicone to assist with possible gas pain/abdominal cramping from GI prep solution      Continue without changes:  -Continue acetaminophen 1000 mg every 8 hours scheduled  -Continue Voltaren 1% gel up to 4 times per day as needed for arthritic and muscular pain  -Continue baclofen 10 mg oral tablet every 8 hours scheduled  -Continue gabapentin 200 oral capsule every night scheduled  -Continue lidocaine 4% patch for 12 hours a day on areas of pain  - Continue hydromorphone 3 mg oral tablet every  4 hours as needed for moderate to severe pain  - Continue ketamine infusion at 0.13 mg/kg/hr (Day 6 out of 7 -discontinue infusion prior to endoscopy or by noon on 11/13)         Ketamine Infusion Order                    Frequency     ketamine (KETALAR) 10 mg/mL infusion  (ADULTS:  Medications + Nursing Interventions)        Question Answer Comment   Titrate Medication? Do not Titrate Call MD if hemodynamic instability        Continuous        -Patient is on a low dose ketamine infusion for pain   -All ketamine orders will be managed by the [facility] Chronic Pain Service only-orders can only be placed by attendings and fellows   -Ketamine requires dedicated IV (PIV or central line lumen)   -Continue ketamine infusion: 0.13mg /kg/hr Actual body weight   -Patient location: floor (can decrease anytime)   -Day 4 (0-7 days). Started (date): 01/13/22   -Prior to starting: Recent EKG, CMP (creatinine, LFTs), urine pregnancy test (if applicable)   -Daily CMP (creatinine and LFTs daily) while running   -Lorazepam is not available PRN for agitation/hallucinations only related to ketamine infusion   -Glycopyrrolate is not available PRN for salivation   -Zofran PRN is available for nausea   -Please contact the Chronic Pain service with any questions or concerns about ketamine infusion.      We will continue to follow.    Naloxone Rx at discharge?  Is patient on opioids? Yes.  1)Is dose >50MME?  Yes.  2) Is patient prescribed a benzodiazepine (w opioids)? No.  3)Hx of overdose?  No.  4) Hx of substance use disorder? No.  5) Opioids likely to last greater than a week after discharge? Yes.     If yes to 2 or more, prescribe naloxone at discharge.  Nasal narcan for most insured (Nasal narcan 4mg /actuation, prescribe 1 kit, instructions at [email or web address]).  For uninsured, chronic pain can work to assist in finding an option.  OTC nasal narcan now available at most pharmacies for around $45.    Interim History  There were no Acute Events Overnight.  [name] [name] stated that she was feeling a little rough this morning, and that she has sharp lower abdominal pain after a bowel movement that happened overnight.  Dr. Desma Maxim and Dr. Eustaquio Maize were present during this encounter.  Her mother inquired about a hydrocortisone suppository to assist with ulcerative pain in her abdomen.    Analgesia Evaluation:  Pain at minimum: 4/10  Pain at maximum: 5/10    Current pain medication regimen (including how frequent PRN's were used):  -Gabapentin 200 mg at night  -Baclofen 10 mg 3 times daily  -Oxycodone 10 mg every 4 hours as needed x1   -Dilaudid 3 mg every 4 hours as needed x 3  - Dilaudid 0.25 mg IV q6h x3  -Diclofenac gel as needed  - Ketamine gtt        Inpatient Medications  Current Facility-Administered Medications   Medication Dose Route Frequency Provider Last Rate Last Admin    acetaminophen (TYLENOL) tablet 1,000 mg  1,000 mg Oral Q8H Kateri Plummer, MD   1,000 mg at 01/19/22 0537    ammonium lactate (LAC-HYDRIN) 12 % lotion 1 application.  1 application. Topical BID [name], MD   1 application. at 01/18/22 2138  baclofen (LIORESAL) tablet 10 mg  10 mg Oral Q8H Kateri Plummer, MD   10 mg at 01/19/22 0537    cefdinir (OMNICEF) capsule 300 mg  300 mg Oral BID Kateri Plummer, MD   300 mg at 01/18/22 2135    dextrose (D10W) 10% bolus 125 mL  12.5 g Intravenous Q10 Min PRN [name], MD        diclofenac sodium (VOLTAREN) 1 % gel 2 g  2 g Topical QID PRN Kateri Plummer, MD        diphenhydrAMINE (BENADRYL) capsule/tablet 50 mg  50 mg Oral Q6H PRN Arman Filter, MD   50 mg at 01/18/22 2134    diphenhydrAMINE (BENADRYL) capsule/tablet 50 mg  50 mg Oral Once [name], MD        famotidine (PEPCID) tablet 20 mg  20 mg Oral Nightly [name], MD        famotidine (PEPCID) tablet 40 mg  40 mg Oral Once [name], MD        Parenteral Nutrition (CENTRAL)   Intravenous Continuous [name], MD 80 mL/hr at 01/19/22 0733 Rate Verify at 01/19/22 0733    And    fat emulsion 20 % with fish oil (SMOFLIPID) infusion 250 mL  250 mL Intravenous Continuous [name], MD 20.8 mL/hr at 01/19/22 0733 Rate Verify at 01/19/22 0733    [START ON 01/20/2022] Parenteral Nutrition (CENTRAL)   Intravenous Continuous [name], MD        And    [START ON 01/20/2022] fat emulsion 20 % with fish oil (SMOFLIPID) infusion 250 mL  250 mL Intravenous Continuous [name], MD        gabapentin (NEURONTIN) capsule 200 mg  200 mg Oral Nightly Kateri Plummer, MD   200 mg at 01/18/22 2134    glucagon injection 1 mg  1 mg Intramuscular Once PRN [name], MD        glucose chewable tablet 16 g  16 g Oral Q10 Min PRN [name], MD        hydrocortisone (ANUSOL-HC) suppository 25 mg  25 mg Rectal BID PRN [name], MD        hydrocortisone 1 % cream   Topical BID PRN Dorise Hiss, ACNP        HYDROmorphone (DILAUDID) tablet 3 mg  3 mg Oral Q4H PRN [name], MD   3 mg at 01/18/22 2313    HYDROmorphone (PF) (DILAUDID) injection 0.25 mg  0.25 mg Intravenous Q6H PRN [name], MD   0.25 mg at 01/19/22 0552    hydrOXYzine (ATARAX) tablet 50 mg  50 mg Oral Q6H PRN Arman Filter, MD        hyoscyamine (LEVSIN) tablet 0.125 mg  0.125 mg Oral Q4H PRN Kateri Plummer, MD        insulin lispro (HumaLOG) injection 0-20 Units  0-20 Units Subcutaneous Q6H [facility] [name], MD   1 Units at 01/19/22 0552    ketamine (KETALAR) 10 mg/mL infusion  0.13 mg/kg/hr Intravenous Continuous Gilmore Laroche, MD 0.7 mL/hr at 01/19/22 0731 0.13 mg/kg/hr at 01/19/22 0731    lidocaine 4 % patch 2 patch  2 patch Transdermal Daily Kateri Plummer, MD   2 patch at 01/18/22 1610    magic mouthwash oral suspension (diphenhydrAMINE, nystatin) 10 mL  10 mL Oral QID PRN Desma Maxim,  Netty Starring, MD   10 mL at 01/18/22 1224    melatonin tablet 3 mg  3 mg Oral Nightly PRN Kateri Plummer, MD        mirtazapine (REMERON) tablet 7.5 mg  7.5 mg Oral Nightly Kateri Plummer, MD   7.5 mg at 01/18/22 2134    naloxone [facility]) injection 0.1 mg  0.1 mg Intravenous Q5 Min PRN Kateri Plummer, MD        OLANZapine zydis (ZyPREXA) disintegrating tablet 5 mg  5 mg Oral Nightly Kateri Plummer, MD   5 mg at 01/18/22 2134 ondansetron (ZOFRAN-ODT) disintegrating tablet 4 mg  4 mg Oral Q6H PRN [name], MD        Or    ondansetron [facility]) injection 4 mg  4 mg Intravenous Q6H PRN [name], MD   4 mg at 01/19/22 0552    polyethylene glycol (MIRALAX) packet 119 g  119 g Oral Once [name], MD        predniSONE (DELTASONE) tablet 50 mg  50 mg Oral Once [name], MD        senna [facility]) tablet 2 tablet  2 tablet Oral Nightly PRN Kateri Plummer, MD        simethicone [facility]) chewable tablet 160 mg  160 mg Oral QID PRN Arman Filter, MD   160 mg at 01/13/22 0958    sodium chloride (NS) 0.9 % infusion  10 mL/hr Intravenous Continuous Gilmore Laroche, MD 10 mL/hr at 01/13/22 1342 10 mL/hr at 01/13/22 1342    sodium ferric gluconate (FERRLECIT) 250 mg in sodium chloride (NS) 0.9 % 100 mL IVPB  250 mg Intravenous Once [name], MD        SORAfenib (NexAVAR) tablet 200 mg **patient supplied**  200 mg Oral Daily Sanoff, Mabeline Caras, MD   200 mg at 01/18/22 0910    tacrolimus (PROTOPIC) 0.1 % ointment 1 application.  1 application. Topical BID PRN [name], MD        triamcinolone (KENALOG) 0.1 % ointment   Topical BID PRN Kateri Plummer, MD   Given at 01/15/22 1215         Objective:     Vital Signs    Temp:  [36.2 ??C (97.1 ??F)-36.8 ??C (98.2 ??F)] 36.8 ??C (98.2 ??F)  Heart Rate:  [78-89] 78  Resp:  [16] 16  BP: (112-124)/(62) 112/62  MAP (mmHg):  [85] 85  SpO2:  [98 %-99 %] 98 %      Physical Exam    GENERAL:  Well developed, well-nourished female and is in no apparent distress.   HEAD/NECK:    Reveals normocephalic/atraumatic.   CARDIOVASCULAR:   Warm, well perfused  LUNGS:   Normal work of breathing  EXTREMITIES:  Warm, no clubbing, cyanosis, or edema was noted.   ABDOMEN:  tender to deep palpation  NEUROLOGIC:    The patient was alert and oriented times four with normal language, attention, cognition and memory. Cranial nerve exam was grossly normal.  Sensation Intact to light touch throughout the bilateral upper and lower extremities.  MUSCULOSKELETAL:    Motor function  5/5 in upper and lower extremities.    SKIN:  No obvious rashes lesions or erythema  PSY:  Appropriate affect and mood.    Test Results    Lab Results   Component Value Date    CREATININE 0.37 (L) 01/19/2022     Lab Results  Component Value Date    ALKPHOS 39 (L) 01/18/2022    BILITOT 0.3 01/18/2022    BILIDIR 0.20 12/04/2021    PROT 5.1 (L) 01/18/2022    ALBUMIN 2.9 (L) 01/18/2022    ALT 18 01/18/2022    AST 20 01/18/2022           Problem List    Principal Problem:    Intractable abdominal pain  Active Problems:    [name]    [name]    Dehydration    Severe protein-calorie malnutrition (CMS-HCC)

## 2022-01-19 NOTE — Unmapped (Signed)
Hospital Medicine Daily Progress Note    Assessment/Plan:    Principal Problem:    Intractable abdominal pain  Active Problems:    [name]    [name]    Dehydration    Severe protein-calorie malnutrition (CMS-HCC)  Resolved Problems:    * No resolved hospital problems. *   Malnutrition Evaluation as performed by RD, LDN: Severe Protein-Calorie Malnutrition in the context of chronic illness (01/17/22 1131)             [name] [name] is a 21 y.o. 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 She was also started on TPN this admission per prior outpatient plan. Hospital course complicated by GI bleed.     Chronic iron deficiency. Acute on chronic blood losses  Follows with Hematology. Attributed to menstrual bleeding, impaired absorption from abdominal disease, and chronic GI bleeding. Had infusion reaction to sodium ferric gluconate in 04/2021 requiring ED visit for steroids and benadryl. Ferritin 13, fe 33, fe sat 12% and ongoing GI bleeding w/ drop in hgb 11-->8 range.   - IV iron repletion, x6 doses given ongoing losses (completing 11/12)  - [facility] premedication protocol for ferric gluconate challenge: (no events)  - Prednisone 50mg  at 13h, 7h, and 1h prior to infusion.  - Benadryl 50mg  at 1h prior to infusion  - Pepcid 40mg , 1h prior to infusion  - Tylenol 650mg   - bowel prep today w/ 2L-half prep, miralax and gatorade at pt request  - EGD/pouchoscopy Mon 11/13. Holding Eliquis starting Sat given active bleeding  - Protocol GIB mgmt  - Hydrocort PR per GI recs    Hx line associated DVT. Follows w/ Heme, most recent note 01/02/22 Homero Fellers, [place]  - Heme recs ppx w/ enox 40 or eliquis 2.5 bid when pt has line in place  - Wise to monitor for bleeding for 24-48 hrs after Eliquis restarted    Acute on chronic abdominal pain  Abdominal desmoid tumors  Patient underwent celiac plexus block on 01/08/2022 with [name] as an outpatient and presented with acute right flank and RLQ pain. Per outpatient notes, [name] feels her acute pain response is most consistent with muscle spasms and referred pain from the spasms. CT showed small locule of gas in the left perirenal tract, likely post-procedural from block, but no renal or psoas injury or other acute findings. Patient continued to have severe pain requiring IV hydromorphone q3 hrs. Chronic Pain consulted and provided ketamine drip after careful risk/benefit discussion w/ pt  - Chronic Pain consulted, appreciate recs, final day (7) of ketamine infusion  - Continue home regimen with scheduled tylenol 1000 mg q8h, diclofenac, lidocaine patch  - Continue hydromorphone 3 q4 PRN  - Wean hydromorphone 0.25-->0.15 (minimum dose) mg iv q6h PRN severe pain  - Hydromorphone IV will expire with DC of ketamine 11/13 AM    Severe malnutrition in setting of [name] and abdominal desmoid tumors  Patient has been on TPN twice before, administered through PICC lines which then were ultimately removed and central lines placed (at least once due to clot). She was recently discharged with a corpak and plan for tube feeds at home, but continued to lose weight and have difficulty with pain control and nausea. She was seen recently by Dr. [name] and her outpatient dietitian with plans to resume TPN and had  waiting for a direct admission to do. Tunneled line placed 11/3 and she was started on TPN on 11/3. NG tube removed 11/4 due to no plans to continue tube feeds.   - Continue TPN daily. Dr. [name] has agreed to follow TPN orders/labs after discharge.    - Appreciate CM assistance with referral for home infusion  - VTE ppx per Heme recs while central line in place - apixaban 2.5 bid (held for GIB)     [name]/Desmoid tumors. 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 Mucositis mixture and ammonium for hydration and symptomatic relief    Anxiety. Continue zyprexa 2.5qhs, Atarax 25 q6prn, Remeron 7.5 at bedtime     FEN: Regular diet by mouth for comfort/as tolerated. TPN per dietician recommendations.   VTE ppx: Eliquis 2.5 mg BID - holding as of 11/11  HCDM: Agard,[name] - Mother - [phone]  HCDM, back-up: [name] - Father - [phone]    I personally spent 50 minutes face-to-face and non-face-to-face in the care of this patient, which includes all pre, intra, and post visit time on the date of service.  All documented time was specific to the E/M visit and does not include any procedures that may have been performed.    ___________________________________________________________________    Subjective:  No overnight issues w/ Ferrlecit, dosed promptly at 11pm  Wonders if she might have a little hand swelling  Minimal SSI requirements  Mild leukocytosis likely r/t prednisone x multiple doses over past 3 days, explained to pt/parents, no plan for workup  Mom w/ Qs re appropriate salve for face, had been working w/ Pharmacy to use home vs hospital-issues supply, Mom will re-address tomorrow w/ Pharm    PRNs/24h:   HM 3 po: 1222, 1649, 2313  HM 0.25 iv: 1439, 2155, 0552  Zofran iv 2155, 0552    Labs/Studies:  Labs and Studies from the last 24hrs per EMR and Reviewed    Hgb (11/7 - 11/12)  11.7>10.9>10.1>9.9>8.5>8.7>8.3     Objective:  Temp:  [36.2 ??C (97.1 ??F)-36.8 ??C (98.2 ??F)] 36.8 ??C (98.2 ??F)  Heart Rate:  [78-89] 78  Resp:  [16] 16  BP: (112-124)/(62) 112/62  SpO2:  [98 %-99 %] 98 %  Gen: Appears fatigued but alert and fully interactive, sitting up in bed, parents at bedside  ENT: MMM. Tunneled line in R anterior chest wall  Pulm: CTAB, normal WOB  Abd: NABS, soft, mild tend, nondist  Ext: WWP, no edema or cyanosis  Neuro: A&O x 3, no focal deficits  Psych: Appropriate mood and affect

## 2022-01-19 NOTE — Unmapped (Signed)
Problem: Adult Inpatient Plan of Care  Goal: Plan of Care Review  Outcome: Progressing  Goal: Patient-Specific Goal (Individualized)  Outcome: Progressing  Goal: Absence of Hospital-Acquired Illness or Injury  Outcome: Progressing  Intervention: Identify and Manage Fall Risk  Recent Flowsheet Documentation  Taken 01/19/2022 0000 by Phineas Real, RN  Safety Interventions:   low bed   lighting adjusted for tasks/safety  Taken 01/18/2022 2200 by Phineas Real, RN  Safety Interventions:   low bed   lighting adjusted for tasks/safety  Taken 01/18/2022 2000 by Phineas Real, RN  Safety Interventions:   low bed   lighting adjusted for tasks/safety  Intervention: Prevent Skin Injury  Recent Flowsheet Documentation  Taken 01/19/2022 0000 by Phineas Real, RN  Positioning for Skin: Supine/Back  Skin Protection: adhesive use limited  Taken 01/18/2022 2200 by Phineas Real, RN  Positioning for Skin: Supine/Back  Skin Protection: adhesive use limited  Taken 01/18/2022 2000 by Phineas Real, RN  Positioning for Skin: Left  Intervention: Prevent and Manage VTE (Venous Thromboembolism) Risk  Recent Flowsheet Documentation  Taken 01/18/2022 2000 by Phineas Real, RN  VTE Prevention/Management: ambulation promoted  Goal: Optimal Comfort and Wellbeing  Outcome: Progressing  Goal: Readiness for Transition of Care  Outcome: Progressing  Goal: Rounds/Family Conference  Outcome: Progressing     Problem: Fall Injury Risk  Goal: Absence of Fall and Fall-Related Injury  Outcome: Progressing  Intervention: Identify and Manage Contributors  Recent Flowsheet Documentation  Taken 01/18/2022 2000 by Phineas Real, RN  Self-Care Promotion: independence encouraged  Intervention: Promote Injury-Free Environment  Recent Flowsheet Documentation  Taken 01/19/2022 0000 by Phineas Real, RN  Safety Interventions:   low bed   lighting adjusted for tasks/safety  Taken 01/18/2022 2200 by Phineas Real, RN  Safety Interventions:   low bed   lighting adjusted for tasks/safety  Taken 01/18/2022 2000 by Phineas Real, RN  Safety Interventions:   low bed   lighting adjusted for tasks/safety     Problem: Malnutrition  Goal: Improved Nutritional Intake  Outcome: Progressing     Problem: Self-Care Deficit  Goal: Improved Ability to Complete Activities of Daily Living  Outcome: Progressing  Intervention: Promote Activity and Functional Independence  Recent Flowsheet Documentation  Taken 01/18/2022 2000 by Phineas Real, RN  Self-Care Promotion: independence encouraged     Problem: Latex Allergy  Goal: Absence of Allergy Symptoms  Outcome: Progressing

## 2022-01-20 DIAGNOSIS — R1031 Right lower quadrant pain: Secondary | ICD-10-CM | POA: Diagnosis not present

## 2022-01-20 DIAGNOSIS — G8929 Other chronic pain: Secondary | ICD-10-CM | POA: Diagnosis not present

## 2022-01-20 LAB — BASIC METABOLIC PANEL
ANION GAP: 8 mmol/L (ref 5–14)
BLOOD UREA NITROGEN: 13 mg/dL (ref 9–23)
BUN / CREAT RATIO: 34
CALCIUM: 8.3 mg/dL — ABNORMAL LOW (ref 8.7–10.4)
CHLORIDE: 107 mmol/L (ref 98–107)
CO2: 28 mmol/L (ref 20.0–31.0)
CREATININE: 0.38 mg/dL — ABNORMAL LOW
EGFR CKD-EPI (2021) FEMALE: >90 mL/min/{1.73_m2} (ref >=60)
GLUCOSE RANDOM: 159 mg/dL (ref 70–179)
POTASSIUM: 4.2 mmol/L (ref 3.4–4.8)
SODIUM: 143 mmol/L (ref 135–145)

## 2022-01-20 LAB — HEMOGLOBIN: HEMOGLOBIN: 7.8 g/dL — ABNORMAL LOW (ref 11.3–14.9)

## 2022-01-20 LAB — CBC
HEMATOCRIT: 24.9 % — ABNORMAL LOW (ref 34.0–44.0)
HEMOGLOBIN: 8.2 g/dL — ABNORMAL LOW (ref 11.3–14.9)
MEAN CORPUSCULAR HEMOGLOBIN CONC: 32.7 g/dL (ref 32.0–36.0)
MEAN CORPUSCULAR HEMOGLOBIN: 28.8 pg (ref 25.9–32.4)
MEAN CORPUSCULAR VOLUME: 87.9 fL (ref 77.6–95.7)
MEAN PLATELET VOLUME: 10.1 fL (ref 6.8–10.7)
PLATELET COUNT: 205 10*9/L (ref 150–450)
RED BLOOD CELL COUNT: 2.84 10*12/L — ABNORMAL LOW (ref 3.95–5.13)
RED CELL DISTRIBUTION WIDTH: 13.1 % (ref 12.2–15.2)
WBC ADJUSTED: 19.9 10*9/L — ABNORMAL HIGH (ref 3.6–11.2)

## 2022-01-20 LAB — PHOSPHORUS: PHOSPHORUS: 4.4 mg/dL (ref 2.4–5.1)

## 2022-01-20 LAB — MAGNESIUM: MAGNESIUM: 2.7 mg/dL — ABNORMAL HIGH (ref 1.6–2.6)

## 2022-01-20 MED ADMIN — acetaminophen (TYLENOL) tablet 1,000 mg: 1000 mg | ORAL | @ 11:00:00

## 2022-01-20 MED ADMIN — simethicone (MYLICON) chewable tablet 160 mg: 160 mg | ORAL | @ 02:00:00

## 2022-01-20 MED ADMIN — fentaNYL (PF) (SUBLIMAZE) injection: INTRAVENOUS | @ 15:00:00 | Stop: 2022-01-20

## 2022-01-20 MED ADMIN — Propofol (DIPRIVAN) injection: INTRAVENOUS | @ 14:00:00 | Stop: 2022-01-20

## 2022-01-20 MED ADMIN — HYDROmorphone (PF) (DILAUDID) injection 0.2 mg: .2 mg | INTRAVENOUS | @ 15:00:00 | Stop: 2022-01-20

## 2022-01-20 MED ADMIN — propofol (DIPRIVAN) infusion 10 mg/mL: INTRAVENOUS | @ 14:00:00 | Stop: 2022-01-20

## 2022-01-20 MED ADMIN — acetaminophen (TYLENOL) tablet 1,000 mg: 1000 mg | ORAL | @ 19:00:00

## 2022-01-20 MED ADMIN — HYDROmorphone (PF) (DILAUDID) injection 0.2 mg: .2 mg | INTRAVENOUS | @ 16:00:00 | Stop: 2022-01-20

## 2022-01-20 MED ADMIN — famotidine (PEPCID) tablet 40 mg: 40 mg | ORAL | @ 03:00:00 | Stop: 2022-01-19

## 2022-01-20 MED ADMIN — gabapentin (NEURONTIN) capsule 200 mg: 200 mg | ORAL | @ 03:00:00

## 2022-01-20 MED ADMIN — baclofen (LIORESAL) tablet 10 mg: 10 mg | ORAL | @ 19:00:00

## 2022-01-20 MED ADMIN — acetaminophen (TYLENOL) tablet 1,000 mg: 1000 mg | ORAL | @ 03:00:00

## 2022-01-20 MED ADMIN — cefdinir (OMNICEF) capsule 300 mg: 300 mg | ORAL | @ 03:00:00 | Stop: 2022-02-09

## 2022-01-20 MED ADMIN — HYDROmorphone (PF) (DILAUDID) injection 0.15 mg: .15 mg | INTRAVENOUS | @ 11:00:00 | Stop: 2022-01-20

## 2022-01-20 MED ADMIN — sodium ferric gluconate (FERRLECIT) 250 mg in sodium chloride (NS) 0.9 % 100 mL IVPB: 250 mg | INTRAVENOUS | @ 05:00:00

## 2022-01-20 MED ADMIN — HYDROmorphone (DILAUDID) tablet 3 mg: 3 mg | ORAL | @ 05:00:00 | Stop: 2022-01-24

## 2022-01-20 MED ADMIN — baclofen (LIORESAL) tablet 10 mg: 10 mg | ORAL | @ 11:00:00

## 2022-01-20 MED ADMIN — cefdinir (OMNICEF) capsule 300 mg: 300 mg | ORAL | @ 17:00:00 | Stop: 2022-02-09

## 2022-01-20 MED ADMIN — enoxaparin (LOVENOX) syringe 30 mg: 30 mg | SUBCUTANEOUS | @ 19:00:00 | Stop: 2022-01-20

## 2022-01-20 MED ADMIN — lidocaine (XYLOCAINE) 20 mg/mL (2 %) injection: INTRAVENOUS | @ 14:00:00 | Stop: 2022-01-20

## 2022-01-20 MED ADMIN — lidocaine 4 % patch 2 patch: 2 | TRANSDERMAL | @ 17:00:00

## 2022-01-20 MED ADMIN — mirtazapine (REMERON) tablet 7.5 mg: 7.5 mg | ORAL | @ 03:00:00

## 2022-01-20 MED ADMIN — HYDROmorphone (PF) (DILAUDID) injection 0.5 mg: .5 mg | INTRAVENOUS | @ 17:00:00 | Stop: 2022-01-20

## 2022-01-20 MED ADMIN — OLANZapine zydis (ZyPREXA) disintegrating tablet 5 mg: 5 mg | ORAL | @ 03:00:00

## 2022-01-20 MED ADMIN — midazolam (VERSED) injection: INTRAVENOUS | @ 14:00:00 | Stop: 2022-01-20

## 2022-01-20 MED ADMIN — HYDROmorphone (PF) (DILAUDID) injection 0.15 mg: .15 mg | INTRAVENOUS | @ 03:00:00 | Stop: 2022-01-19

## 2022-01-20 MED ADMIN — ammonium lactate (LAC-HYDRIN) 12 % lotion 1 application.: 1 | TOPICAL | @ 03:00:00

## 2022-01-20 MED ADMIN — baclofen (LIORESAL) tablet 10 mg: 10 mg | ORAL | @ 03:00:00

## 2022-01-20 MED ADMIN — ondansetron (ZOFRAN) injection 4 mg: 4 mg | INTRAVENOUS

## 2022-01-20 MED ADMIN — fentaNYL (PF) (SUBLIMAZE) injection: INTRAVENOUS | @ 14:00:00 | Stop: 2022-01-20

## 2022-01-20 MED ADMIN — ondansetron (ZOFRAN) injection 4 mg: 4 mg | INTRAVENOUS | @ 14:00:00

## 2022-01-20 MED ADMIN — magic mouthwash oral suspension (diphenhydrAMINE, nystatin) 10 mL: 10 mL | ORAL | @ 03:00:00

## 2022-01-20 MED ADMIN — diphenhydrAMINE (BENADRYL) capsule/tablet 50 mg: 50 mg | ORAL | @ 03:00:00 | Stop: 2022-01-19

## 2022-01-20 MED ADMIN — fat emulsion 20 % with fish oil (SMOFLIPID) infusion 250 mL: 250 mL | INTRAVENOUS | @ 05:00:00 | Stop: 2022-01-20

## 2022-01-20 MED ADMIN — Propofol (DIPRIVAN) injection: INTRAVENOUS | @ 15:00:00 | Stop: 2022-01-20

## 2022-01-20 MED ADMIN — HYDROmorphone (DILAUDID) tablet 3 mg: 3 mg | ORAL | @ 22:00:00 | Stop: 2022-01-24

## 2022-01-20 MED ADMIN — Parenteral Nutrition (CENTRAL): INTRAVENOUS | @ 05:00:00 | Stop: 2022-01-20

## 2022-01-20 MED ADMIN — ondansetron (ZOFRAN) injection 4 mg: 4 mg | INTRAVENOUS | @ 11:00:00

## 2022-01-20 MED ADMIN — ammonium lactate (LAC-HYDRIN) 12 % lotion 1 application.: 1 | TOPICAL | @ 17:00:00

## 2022-01-20 MED ADMIN — lactated Ringers infusion: 10 mL/h | INTRAVENOUS | @ 13:00:00

## 2022-01-20 MED ADMIN — predniSONE (DELTASONE) tablet 50 mg: 50 mg | ORAL | @ 03:00:00 | Stop: 2022-01-19

## 2022-01-20 MED ADMIN — NIFEdipine-lidocaine 0.3%-1.5% in petrolatum ointment: TOPICAL | @ 19:00:00

## 2022-01-20 NOTE — Unmapped (Signed)
Problem: Adult Inpatient Plan of Care  Goal: Plan of Care Review  Outcome: Progressing  Goal: Patient-Specific Goal (Individualized)  Outcome: Progressing  Goal: Absence of Hospital-Acquired Illness or Injury  Outcome: Progressing  Intervention: Identify and Manage Fall Risk  Recent Flowsheet Documentation  Taken 01/19/2022 2030 by Phineas Real, RN  Safety Interventions:   family at bedside   fall reduction program maintained   low bed   lighting adjusted for tasks/safety  Intervention: Prevent Skin Injury  Recent Flowsheet Documentation  Taken 01/20/2022 0400 by Phineas Real, RN  Positioning for Skin: Left  Taken 01/20/2022 0200 by Phineas Real, RN  Positioning for Skin: Left  Taken 01/19/2022 2200 by Phineas Real, RN  Positioning for Skin: Right  Taken 01/19/2022 2030 by Phineas Real, RN  Positioning for Skin: (pt turns independently) Supine/Back  Intervention: Prevent and Manage VTE (Venous Thromboembolism) Risk  Recent Flowsheet Documentation  Taken 01/19/2022 2030 by Phineas Real, RN  VTE Prevention/Management: ambulation promoted  Goal: Optimal Comfort and Wellbeing  Outcome: Progressing  Goal: Readiness for Transition of Care  Outcome: Progressing  Goal: Rounds/Family Conference  Outcome: Progressing     Problem: Fall Injury Risk  Goal: Absence of Fall and Fall-Related Injury  Outcome: Progressing  Intervention: Promote Injury-Free Environment  Recent Flowsheet Documentation  Taken 01/19/2022 2030 by Phineas Real, RN  Safety Interventions:   family at bedside   fall reduction program maintained   low bed   lighting adjusted for tasks/safety     Problem: Malnutrition  Goal: Improved Nutritional Intake  Outcome: Progressing     Problem: Self-Care Deficit  Goal: Improved Ability to Complete Activities of Daily Living  Outcome: Progressing     Problem: Latex Allergy  Goal: Absence of Allergy Symptoms  Outcome: Progressing

## 2022-01-20 NOTE — Unmapped (Cosign Needed)
Department of Anesthesiology  Pain Medicine Division    Chronic Pain Followup Inpatient Consult Note    Requesting Attending Physician:  Edd Fabian, DO  Service Requesting Consult:  Med Bernita Raisin [facility])    Assessment/Recommendations:  The patient was seen in consultation on request of Arman Filter, MD regarding assistance with pain management. The patient is not obtaining adequate pain relief on current medication regimen.      The patient is a 21 y.o. 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 She was also started on TPN this admission per prior outpatient plan.       Chronic pain team was consulted as patient is having acute on chronic pain.  Patient recently received bilateral celiac plexus blocks on November 1 as an outpatient.  Patient reported having right-sided flank and right lower quadrant pain that was thought to be muscle spasms.  Patient was sent to the ER by [name] for possible evaluation of a perforation.  Imaging in the ED was negative for any findings.  In the past patient has had a history of having significant pain flares after trigger point injections that continues for 1 day.  She has used Toradol before which does help with pain relief.       We discussed completing the IV ketamine infusion and weaning off the IV dilaudid during this hospitalization.    Interval: The patient had worsening pain control with her GI cleanout and pouchoscopy this morning. Explained that she may have gotten behind with the PO dilaudid with the cleanout. Patient and primary team amendable to one more dose of IV dilaudid after procedure this afternoon and continuing with PO dilaudid PRN. Ketamine infusion will end this afternoon and PRN IV dilaudid will d/c today. Counseled her on usage of oral opioids at an increased frequency if needed.     Of note, there is no evidence of hepatic or renal dysfunction on laboratory studies.    Recommendations:  -The chronic pain service is a consult service and does not place orders, just makes recommendations (except ketamine and lidocaine infusions)   -Please evaluate all patients on opioids for appropriateness of prescribing narcan at discharge.  The chronic pain service can assist with this.  Nasal narcan is covered by most insurances.  -Recommendations given apply to the current hospitalization and do not reflect long term recommendations.    Changes to current analgesic regimen:  -Agree with one dose of IV dilaudid 0.5mg  this afternoon following GI procedure  -Recommend discontinuing IV dilaudid PRN (patient counseled that PRN IV dilaudid would discontinue today as the ketamine infusion ends and encouraged to use PO dilaudid PRN)  -Discontinue ketamine infusion 11/13 at 3pm (orders placed)  -Please order a CMP instead of a BMP on 11/14 to monitor liver function given that the patient is on ketamine  -Consider addition of simethicone to assist with possible gas pain/abdominal cramping from GI prep solution      Continue without changes:  -Continue acetaminophen 1000 mg every 8 hours scheduled  -Continue Voltaren 1% gel up to 4 times per day as needed for arthritic and muscular pain  -Continue baclofen 10 mg oral tablet every 8 hours scheduled  -Continue gabapentin 200 oral capsule every night scheduled  -Continue lidocaine 4% patch for 12 hours a day on areas of pain  - Continue hydromorphone  3 mg oral tablet every 4 hours as needed for moderate to severe pain         Ketamine Infusion Order                    Frequency     ketamine (KETALAR) 10 mg/mL infusion  (ADULTS:  Medications + Nursing Interventions)        Question Answer Comment   Titrate Medication? Do not Titrate    Call MD if hemodynamic instability        Continuous        -Patient is on a low dose ketamine infusion for pain   -All ketamine orders will be managed by the [facility] Chronic Pain Service only-orders can only be placed by attendings and fellows   -Ketamine requires dedicated IV (PIV or central line lumen)   -Continue ketamine infusion: 0.13mg /kg/hr Actual body weight   -Patient location: floor (can decrease anytime)   -Day 7 (0-7 days). Started (date): 01/13/22   -Prior to starting: Recent EKG, CMP (creatinine, LFTs), urine pregnancy test (if applicable)   -Daily CMP (creatinine and LFTs daily) while running   -Lorazepam is not available PRN for agitation/hallucinations only related to ketamine infusion   -Glycopyrrolate is not available PRN for salivation   -Zofran PRN is available for nausea   -Please contact the Chronic Pain service with any questions or concerns about ketamine infusion.      We will continue to follow.    Naloxone Rx at discharge?  Is patient on opioids? Yes.  1)Is dose >50MME?  Yes.  2) Is patient prescribed a benzodiazepine (w opioids)? No.  3)Hx of overdose?  No.  4) Hx of substance use disorder? No.  5) Opioids likely to last greater than a week after discharge? Yes.     If yes to 2 or more, prescribe naloxone at discharge.  Nasal narcan for most insured (Nasal narcan 4mg /actuation, prescribe 1 kit, instructions at [email or web address]).  For uninsured, chronic pain can work to assist in finding an option.  OTC nasal narcan now available at most pharmacies for around $45.    Interim History  There were no Acute Events Overnight.  Ms. [name] stated that she was feeling a little rough this morning, and that she has sharp lower abdominal pain after her GI cleanout and procedure this morning.     Analgesia Evaluation:  Pain at minimum:  6/10  Pain at maximum: 10/10    Current pain medication regimen (including how frequent PRN's were used):  -Gabapentin 200 mg at night  -Baclofen 10 mg 3 times daily  -Dilaudid 3 mg every 4 hours as needed x 3  -Dilaudid 0.15 mg IV q6h as needed x3  -Diclofenac gel as needed  -Ketamine gtt        Inpatient Medications  Current Facility-Administered Medications   Medication Dose Route Frequency Provider Last Rate Last Admin    acetaminophen (TYLENOL) tablet 1,000 mg  1,000 mg Oral Q8H Janjua, Ejaz Arshad, DO   1,000 mg at 01/20/22 0554    ammonium lactate (LAC-HYDRIN) 12 % lotion 1 application.  1 application. Topical BID Edd Fabian, DO   1 application. at 01/20/22 1156    baclofen (LIORESAL) tablet 10 mg  10 mg Oral Q8H Janjua, Ejaz Arshad, DO   10 mg at 01/20/22 0554    cefdinir (OMNICEF) capsule 300 mg  300 mg Oral BID Edd Fabian, DO   300 mg at 01/20/22 1149  diclofenac sodium (VOLTAREN) 1 % gel 2 g  2 g Topical QID PRN Edd Fabian, DO        diphenhydrAMINE (BENADRYL) capsule/tablet 50 mg  50 mg Oral Q6H PRN Edd Fabian, DO   50 mg at 01/18/22 2134    famotidine (PEPCID) tablet 20 mg  20 mg Oral Nightly Glynis Smiles [place], DO        [START ON 01/21/2022] Parenteral Nutrition (CENTRAL)   Intravenous Continuous Edd Fabian, DO        And    [START ON 01/21/2022] fat emulsion 20 % with fish oil (SMOFLIPID) infusion 250 mL  250 mL Intravenous Continuous Janjua, Ronald Pippins, DO        gabapentin (NEURONTIN) capsule 200 mg  200 mg Oral Nightly Edd Fabian, DO   200 mg at 01/19/22 2159    hydrocortisone (ANUSOL-HC) suppository 25 mg  25 mg Rectal BID PRN Edd Fabian, DO        hydrocortisone 1 % cream   Topical BID PRN Edd Fabian, DO   Given at 01/19/22 1003    HYDROmorphone (DILAUDID) tablet 3 mg  3 mg Oral Q4H PRN Edd Fabian, DO   3 mg at 01/19/22 2345    HYDROmorphone (PF) (DILAUDID) injection 0.5 mg  0.5 mg Intravenous Once Janjua, Ejaz Arshad, DO        hydrOXYzine (ATARAX) tablet 50 mg  50 mg Oral Q6H PRN Edd Fabian, DO        hyoscyamine (LEVSIN) tablet 0.125 mg  0.125 mg Oral Q4H PRN Edd Fabian, DO        ketamine (KETALAR) 10 mg/mL infusion  0.13 mg/kg/hr Intravenous Continuous Link Snuffer, MD   Stopped at 01/20/22 0726    lactated Ringers infusion  10 mL/hr Intravenous Continuous Edd Fabian, DO 200 mL/hr at 01/20/22 0916 Restarted at 01/20/22 0942    lidocaine 4 % patch 2 patch  2 patch Transdermal Daily Edd Fabian, DO   2 patch at 01/20/22 1150    magic mouthwash oral suspension (diphenhydrAMINE, nystatin) 10 mL  10 mL Oral QID PRN Edd Fabian, DO   10 mL at 01/19/22 2208    melatonin tablet 3 mg  3 mg Oral Nightly PRN Edd Fabian, DO        mirtazapine (REMERON) tablet 7.5 mg  7.5 mg Oral Nightly Edd Fabian, DO   7.5 mg at 01/19/22 2159    naloxone [facility]) injection 0.1 mg  0.1 mg Intravenous Q5 Min PRN Edd Fabian, DO        NIFEdipine-lidocaine 0.3%-1.5% in petrolatum ointment   Topical BID Janjua, Ejaz Arshad, DO        OLANZapine zydis (ZyPREXA) disintegrating tablet 5 mg  5 mg Oral Nightly Janjua, Ronald Pippins, DO   5 mg at 01/19/22 2159    ondansetron (ZOFRAN-ODT) disintegrating tablet 4 mg  4 mg Oral Q6H PRN Edd Fabian, DO        Or    ondansetron (ZOFRAN) injection 4 mg  4 mg Intravenous Q6H PRN Edd Fabian, DO   4 mg at 01/20/22 0920    [MAR Hold] Parenteral Nutrition (CENTRAL)   Intravenous Continuous [name], MD 80 mL/hr at 01/20/22 1146 Restarted at 01/20/22 1146    senna (SENOKOT) tablet 2 tablet  2 tablet Oral Nightly PRN Edd Fabian, DO        simethicone (MYLICON)  chewable tablet 160 mg  160 mg Oral QID PRN Edd Fabian, DO   160 mg at 01/19/22 2041    sodium chloride (NS) 0.9 % infusion  10 mL/hr Intravenous Continuous Edd Fabian, DO 10 mL/hr at 01/13/22 1342 10 mL/hr at 01/13/22 1342    SORAfenib (NexAVAR) tablet 200 mg **patient supplied**  200 mg Oral Daily Edd Fabian, DO   200 mg at 01/19/22 0948    tacrolimus (PROTOPIC) 0.1 % ointment 1 application.  1 application. Topical BID PRN Edd Fabian, DO        triamcinolone (KENALOG) 0.1 % ointment   Topical BID PRN Edd Fabian, DO   Given at 01/19/22 1003         Objective:     Vital Signs    Temp:  [36.2 ??C (97.2 ??F)-36.8 ??C (98.3 ??F)] 36.6 ??C (97.9 ??F)  Heart Rate:  [66-110] 71  SpO2 Pulse:  [66-111] 70  Resp:  [10-22] 15  BP: (96-128)/(32-76) 101/63  MAP (mmHg):  [77-84] 77  SpO2:  [92 %-98 %] 95 %  BMI (Calculated):  [18.8] 18.8      Physical Exam    GENERAL:  Well developed, well-nourished female and is in no apparent distress.  HEAD/NECK:    Reveals normocephalic/atraumatic.   CARDIOVASCULAR:   Warm, well perfused  LUNGS:   Normal work of breathing  EXTREMITIES:  Warm, no clubbing, cyanosis, or edema was noted.   ABDOMEN:  tender to deep palpation  NEUROLOGIC:    The patient was alert and oriented times four with normal language, attention, cognition and memory. Cranial nerve exam was grossly normal.  Sensation Intact to light touch throughout the bilateral upper and lower extremities.  MUSCULOSKELETAL:    Motor function  5/5 in upper and lower extremities.    SKIN:  No obvious rashes lesions or erythema  PSY:  Appropriate affect and mood.    Test Results    Lab Results   Component Value Date    CREATININE 0.38 (L) 01/20/2022     Lab Results   Component Value Date    ALKPHOS 39 (L) 01/18/2022    BILITOT 0.3 01/18/2022    BILIDIR 0.20 12/04/2021    PROT 5.1 (L) 01/18/2022    ALBUMIN 2.9 (L) 01/18/2022    ALT 18 01/18/2022    AST 20 01/18/2022           Problem List    Principal Problem:    Intractable abdominal pain  Active Problems:    [name]    [name]    Dehydration    Severe protein-calorie malnutrition (CMS-HCC)

## 2022-01-20 NOTE — Unmapped (Signed)
Gastroenterology (Luminal) Consult Service   Treatment Plan         Recommendations:   [name] [name] is a 21 y.o. female that presented to [facility] with [name] s/p IPAA. We have been following for hematochezia.    Patient underwent EGD and Pouchoscopy 11/13. No signs of active bleeding on either.    EGD with known gastric polyps, used pediatric colonoscopy to advance to 110cm (though with looping) and was able to evaluate to jejunum. No blood on exam. No source/Etiology for hematochezia seen on EGD.    Pouchoscopy with non-bleeding anal fissure and non-bleeding anastomotic ulcer. These could be the source of patient's bleeding, though not actively bleeding at time of evaluation. Though patient has been off AC for 48 hours.  These are likely the source of patient's bleeding/hematochezia and now have stopped that patient off AC.     Recommendations:  - Continue to monitor Hgb and transfuse for Hgb <7  - Treatment for anal fissure:  --- Pea sized amount of topical nifedipine (0.3% nifedipine + 2% lidocaine) applied to the finger and then placed in the anus, up to the first knuckle, twice a day. Anticipate use for 8 weeks  - Anticoagulation plan per hematology    - Future: Can consider VCE, but would need patency capsule first     Thank you for involving [name] in the care of your patient. We will continue to follow along with you.     For questions, contact the on-call fellow for the Gastroenterology (Luminal) Consult Service.

## 2022-01-20 NOTE — Unmapped (Signed)
Inpatient GI Procedure Update      PRE-OP DOCUMENTATION: PROVIDER CERTIFICATION    I certify that the appropriate documentation of the patient's evaluation, assessment and treatment plan is found within the medical record and that the patient???s condition is unchanged from this earlier assessment.

## 2022-01-20 NOTE — Unmapped (Signed)
Hospital Medicine Daily Progress Note    Assessment/Plan:    Principal Problem:    Intractable abdominal pain  Active Problems:    [name]    [name]    Dehydration    Severe protein-calorie malnutrition (CMS-HCC)  Resolved Problems:    * No resolved hospital problems. *   Malnutrition Evaluation as performed by RD, LDN: Severe Protein-Calorie Malnutrition in the context of chronic illness (01/17/22 1131)             Razia Reather Converse is a 21 y.o. 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 She was also started on TPN this admission per prior outpatient plan. Hospital course complicated by GI bleed.    Acute on chronic blood loss anemia  -secondary to possibly a solitary ulcer at the anastomotic site.  No evidence of pouchitis.  Avoid NSAIDs - this is contraindicated not only for the ulcer but given chronic, recurrent pouchitis.  No transfusions required this hospitalization.  Discussed in detail with GI fellow.  Onc does not feel strongly about desmoid tumor bleeding thus will not pursue a patency study at this time.  GI also stated that if the patency study passes it does not guarantee that the VCE will pass.  She has moderate to severe extrinsic compression/stenosis at the pre-pouch ileum.  Unclear etiology at this time.  No evidence of an obstruction nor a desmoid tumor at that site.  If abdominal pain significantly worsens, will re-image to assess for an obstruction.  If she experiences severe bleeding especially with hemodynamic compromise, transfer to ICU and get an emergent CTA.      Hx line associated DVT.   After a prolonged discussion with the patient and mother, will proceed with lovenox 40 mg for now and when ready for discharge and no further bleeding, will transition to a DOAC.  Will follow-up with pharmacy about coverage for eliquis.      Acute on chronic abdominal pain  Abdominal desmoid tumors  Patient underwent celiac plexus block on 01/08/2022 with [name] as an outpatient and presented with acute right flank and RLQ pain. Per outpatient notes, [name] feels her acute pain response is most consistent with muscle spasms and referred pain from the spasms. CT showed small locule of gas in the left perirenal tract, likely post-procedural from block, but no renal or psoas injury or other acute findings. Patient continued to have severe pain requiring IV hydromorphone q3 hrs. Chronic Pain consulted and provided ketamine drip after careful risk/benefit discussion w/ pt  - Chronic Pain consulted, appreciate recs, weaned off ketamine.  One-time dose of iv dilaudid today.    - Continue home regimen with scheduled tylenol 1000 mg q8h, diclofenac, lidocaine patch  - Continue hydromorphone 3 q4 PRN  - Wean hydromorphone 0.25-->0.15 (minimum dose) mg iv q6h PRN severe pain  - Hydromorphone IV will expire with DC of ketamine 11/13 AM    Severe malnutrition in setting of [name] and abdominal desmoid tumors  Patient has been on TPN twice before, administered through PICC lines which then were ultimately removed and central lines placed (at least once due to clot). She was recently discharged with a corpak and plan for tube feeds at home, but continued to lose weight and have difficulty with pain control and nausea. She was seen recently by Dr. [name] and  her outpatient dietitian with plans to resume TPN and had waiting for a direct admission to do. Tunneled line placed 11/3 and she was started on TPN on 11/3. NG tube removed 11/4 due to no plans to continue tube feeds.   - Continue TPN daily. Dr. [name] has agreed to follow TPN orders/labs after discharge.    - Appreciate CM assistance with referral for home infusion  - VTE ppx per Heme recs while central line in place - lovenox 40 mg for now and possibly eliquis upon discharge.  Anticipate DC home on Wednesday.       [name]/Desmoid tumors. 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 Mucositis mixture and ammonium for hydration and symptomatic relief    Anxiety. Continue zyprexa 2.5qhs, Atarax 25 q6prn, Remeron 7.5 at bedtime     FEN: Regular diet by mouth for comfort/as tolerated. TPN per dietician recommendations.   VTE ppx: Eliquis 2.5 mg BID - holding as of 11/11  HCDM: Yeo,[name] - Mother - [phone]  HCDM, back-up: [name] - Father - [phone]    I personally spent 60 minutes face-to-face and non-face-to-face in the care of this patient, which includes all pre, intra, and post visit time on the date of service.  All documented time was specific to the E/M visit and does not include any procedures that may have been performed.    ___________________________________________________________________    Subjective:  Patient seen after returning from endoscopy and pouchoscopy.  Stated she was in 9/10 pain.      PRNs/24h:   HM po: 01/19/2022: 1816, 2345; 01/20/2022 - MAR held for procedure, now released.    HM 0.25 iv:  11/12:  552: 11/13:  given 0.5 mg at 11:30 am  Zofran iv 2155,  1439, 2155, 0552 on 11/12; 11/13: 554, 920     Labs/Studies:  Labs and Studies from the last 24hrs per EMR and Reviewed    Hgb (11/7 - 11/12)  11.7>10.9>10.1>9.9>8.5>8.7>8.3     Objective:  Temp:  [36.2 ??C (97.2 ??F)-36.7 ??C (98.1 ??F)] 36.7 ??C (98.1 ??F)  Heart Rate:  [66-110] 100  SpO2 Pulse:  [66-111] 70  Resp:  [10-22] 17  BP: (96-128)/(32-76) 119/58  SpO2:  [92 %-99 %] 99 %  Gen: Appears fatigued but alert and fully interactive, sitting up in bed, parents at bedside  ENT: MMM. Tunneled line in R anterior chest wall  Pulm: CTAB, normal WOB  Abd: NABS, soft, mild tend, nondist  Ext: WWP, no edema or cyanosis  Neuro: A&O x 3, no focal deficits  Psych: Appropriate mood and affect

## 2022-01-20 NOTE — Unmapped (Signed)
Placing note to complete repeat consult

## 2022-01-21 LAB — CBC W/ AUTO DIFF
BASOPHILS ABSOLUTE COUNT: 0 10*9/L (ref 0.0–0.1)
BASOPHILS RELATIVE PERCENT: 0.1 %
EOSINOPHILS ABSOLUTE COUNT: 0.1 10*9/L (ref 0.0–0.5)
EOSINOPHILS RELATIVE PERCENT: 0.4 %
HEMATOCRIT: 23.8 % — ABNORMAL LOW (ref 34.0–44.0)
HEMOGLOBIN: 8 g/dL — ABNORMAL LOW (ref 11.3–14.9)
LYMPHOCYTES ABSOLUTE COUNT: 2.6 10*9/L (ref 1.1–3.6)
LYMPHOCYTES RELATIVE PERCENT: 21.9 %
MEAN CORPUSCULAR HEMOGLOBIN CONC: 33.5 g/dL (ref 32.0–36.0)
MEAN CORPUSCULAR HEMOGLOBIN: 29.6 pg (ref 25.9–32.4)
MEAN CORPUSCULAR VOLUME: 88.3 fL (ref 77.6–95.7)
MEAN PLATELET VOLUME: 9.7 fL (ref 6.8–10.7)
MONOCYTES ABSOLUTE COUNT: 1 10*9/L — ABNORMAL HIGH (ref 0.3–0.8)
MONOCYTES RELATIVE PERCENT: 8.7 %
NEUTROPHILS ABSOLUTE COUNT: 8.2 10*9/L — ABNORMAL HIGH (ref 1.8–7.8)
NEUTROPHILS RELATIVE PERCENT: 68.9 %
PLATELET COUNT: 178 10*9/L (ref 150–450)
RED BLOOD CELL COUNT: 2.69 10*12/L — ABNORMAL LOW (ref 3.95–5.13)
RED CELL DISTRIBUTION WIDTH: 12.5 % (ref 12.2–15.2)
WBC ADJUSTED: 12 10*9/L — ABNORMAL HIGH (ref 3.6–11.2)

## 2022-01-21 LAB — BASIC METABOLIC PANEL
ANION GAP: 4 mmol/L — ABNORMAL LOW (ref 5–14)
BLOOD UREA NITROGEN: 18 mg/dL (ref 9–23)
BUN / CREAT RATIO: 50
CALCIUM: 7.9 mg/dL — ABNORMAL LOW (ref 8.7–10.4)
CHLORIDE: 107 mmol/L (ref 98–107)
CO2: 32 mmol/L — ABNORMAL HIGH (ref 20.0–31.0)
CREATININE: 0.36 mg/dL — ABNORMAL LOW
EGFR CKD-EPI (2021) FEMALE: >90 mL/min/{1.73_m2} (ref >=60)
GLUCOSE RANDOM: 108 mg/dL (ref 70–179)
POTASSIUM: 3.4 mmol/L (ref 3.4–4.8)
SODIUM: 143 mmol/L (ref 135–145)

## 2022-01-21 LAB — MAGNESIUM: MAGNESIUM: 2 mg/dL (ref 1.6–2.6)

## 2022-01-21 LAB — PHOSPHORUS: PHOSPHORUS: 4 mg/dL (ref 2.4–5.1)

## 2022-01-21 MED ORDER — OXYCODONE 5 MG TABLET
ORAL_TABLET | ORAL | 0 refills | 9 days | PRN
Start: 2022-01-21 — End: 2022-02-20

## 2022-01-21 MED ADMIN — HYDROmorphone (PF) (DILAUDID) injection 0.5 mg: .5 mg | INTRAVENOUS | @ 22:00:00 | Stop: 2022-01-22

## 2022-01-21 MED ADMIN — baclofen (LIORESAL) tablet 10 mg: 10 mg | ORAL | @ 18:00:00

## 2022-01-21 MED ADMIN — HYDROmorphone (DILAUDID) tablet 3 mg: 3 mg | ORAL | @ 06:00:00 | Stop: 2022-01-21

## 2022-01-21 MED ADMIN — acetaminophen (TYLENOL) tablet 1,000 mg: 1000 mg | ORAL | @ 03:00:00

## 2022-01-21 MED ADMIN — cefdinir (OMNICEF) capsule 300 mg: 300 mg | ORAL | @ 14:00:00 | Stop: 2022-02-09

## 2022-01-21 MED ADMIN — fat emulsion 20 % with fish oil (SMOFLIPID) infusion 250 mL: 250 mL | INTRAVENOUS | @ 05:00:00 | Stop: 2022-01-21

## 2022-01-21 MED ADMIN — apixaban (ELIQUIS) tablet 2.5 mg: 2.5 mg | ORAL | @ 14:00:00

## 2022-01-21 MED ADMIN — HYDROmorphone (PF) (DILAUDID) injection 0.5 mg: .5 mg | INTRAVENOUS | @ 18:00:00 | Stop: 2022-01-22

## 2022-01-21 MED ADMIN — HYDROmorphone (DILAUDID) tablet 3 mg: 3 mg | ORAL | @ 02:00:00 | Stop: 2022-01-24

## 2022-01-21 MED ADMIN — lidocaine 4 % patch 2 patch: 2 | TRANSDERMAL | @ 14:00:00

## 2022-01-21 MED ADMIN — NIFEdipine-lidocaine 0.3%-1.5% in petrolatum ointment: TOPICAL | @ 03:00:00

## 2022-01-21 MED ADMIN — HYDROmorphone (DILAUDID) tablet 3 mg: 3 mg | ORAL | @ 11:00:00 | Stop: 2022-01-21

## 2022-01-21 MED ADMIN — HYDROmorphone (PF) (DILAUDID) injection 0.5 mg: .5 mg | INTRAVENOUS | @ 15:00:00 | Stop: 2022-01-21

## 2022-01-21 MED ADMIN — acetaminophen (TYLENOL) tablet 1,000 mg: 1000 mg | ORAL | @ 18:00:00

## 2022-01-21 MED ADMIN — Parenteral Nutrition (CENTRAL): INTRAVENOUS | @ 05:00:00 | Stop: 2022-01-21

## 2022-01-21 MED ADMIN — cefdinir (OMNICEF) capsule 300 mg: 300 mg | ORAL | @ 03:00:00 | Stop: 2022-02-09

## 2022-01-21 MED ADMIN — baclofen (LIORESAL) tablet 10 mg: 10 mg | ORAL | @ 11:00:00

## 2022-01-21 MED ADMIN — ammonium lactate (LAC-HYDRIN) 12 % lotion 1 application.: 1 | TOPICAL | @ 03:00:00

## 2022-01-21 MED ADMIN — acetaminophen (TYLENOL) tablet 1,000 mg: 1000 mg | ORAL | @ 11:00:00

## 2022-01-21 MED ADMIN — famotidine (PEPCID) tablet 20 mg: 20 mg | ORAL | @ 03:00:00

## 2022-01-21 MED ADMIN — ondansetron (ZOFRAN) injection 4 mg: 4 mg | INTRAVENOUS | @ 23:00:00

## 2022-01-21 MED ADMIN — SORAfenib (NexAVAR) tablet 200 mg **patient supplied**: 200 mg | ORAL | @ 14:00:00

## 2022-01-21 MED ADMIN — gabapentin (NEURONTIN) capsule 200 mg: 200 mg | ORAL | @ 03:00:00

## 2022-01-21 MED ADMIN — baclofen (LIORESAL) tablet 10 mg: 10 mg | ORAL | @ 03:00:00

## 2022-01-21 MED ADMIN — OLANZapine zydis (ZyPREXA) disintegrating tablet 5 mg: 5 mg | ORAL | @ 03:00:00

## 2022-01-21 MED ADMIN — HYDROmorphone (PF) (DILAUDID) injection 0.5 mg: .5 mg | INTRAVENOUS | @ 01:00:00 | Stop: 2022-01-20

## 2022-01-21 MED ADMIN — ondansetron (ZOFRAN) injection 4 mg: 4 mg | INTRAVENOUS | @ 14:00:00

## 2022-01-21 MED ADMIN — hydrOXYzine (ATARAX) tablet 50 mg: 50 mg | ORAL | @ 18:00:00

## 2022-01-21 MED ADMIN — oxyCODONE (ROXICODONE) immediate release tablet 10 mg: 10 mg | ORAL | @ 20:00:00 | Stop: 2022-01-26

## 2022-01-21 MED ADMIN — mirtazapine (REMERON) tablet 7.5 mg: 7.5 mg | ORAL | @ 03:00:00

## 2022-01-21 MED ADMIN — ammonium lactate (LAC-HYDRIN) 12 % lotion 1 application.: 1 | TOPICAL | @ 14:00:00

## 2022-01-21 NOTE — Unmapped (Signed)
Hospital Medicine Daily Progress Note    Assessment/Plan:    Principal Problem:    Intractable abdominal pain  Active Problems:    [name]    [name]    Dehydration    Severe protein-calorie malnutrition (CMS-HCC)  Resolved Problems:    * No resolved hospital problems. *   Malnutrition Evaluation as performed by RD, LDN: Severe Protein-Calorie Malnutrition in the context of chronic illness (01/17/22 1131)             [name] [name] is a 21 y.o. 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 She was also started on TPN this admission per prior outpatient plan. Hospital course complicated by GI bleed.    Near-syncope:  Appears to be vasovagal given pain and nausea associated with the episode.  POTS can't be excluded given HR jumped up to 134 from 110 upon standing; however, this is difficult to diagnose when she states she is in this amount of pain.  When on a long-acting agent outpatient and pain is better-controlled, can reassess for POTS at that time and see if midodrine or fludrocortisone could help.      Acute on chronic blood loss anemia  -secondary to possibly a solitary ulcer at the anastomotic site.  No evidence of pouchitis.  Avoid NSAIDs - this is contraindicated not only for the ulcer but given chronic, recurrent pouchitis.  No transfusions required this hospitalization.  Discussed in detail with GI fellow.  Onc does not feel strongly about desmoid tumor bleeding thus will not pursue a patency study at this time.  GI also stated that if the patency study passes it does not guarantee that the VCE will pass.  She has moderate to severe extrinsic compression/stenosis at the pre-pouch ileum.  Unclear etiology at this time.  No evidence of an obstruction nor a desmoid tumor at that site.  If abdominal pain significantly worsens, will re-image to assess for an obstruction.  If she experiences severe bleeding especially with hemodynamic compromise, transfer to ICU and get an emergent CTA.  Hgb stable for today.  No further bleeding so far.      Hx line associated DVT.   After a prolonged discussion with the patient and mother, eliquis started today.  Will monitor for bleeding today.  Per Heme recs, needs to be on this while she has a long-term CVC.        Acute on chronic abdominal pain  Abdominal desmoid tumors  Patient underwent celiac plexus block on 01/08/2022 with [name] as an outpatient and presented with acute right flank and RLQ pain. Per outpatient notes, [name] feels her acute pain response is most consistent with muscle spasms and referred pain from the spasms. CT showed small locule of gas in the left perirenal tract, likely post-procedural from block, but no renal or psoas injury or other acute findings. Patient continued to have severe pain requiring IV hydromorphone q3 hrs. Chronic Pain consulted and provided ketamine drip after careful risk/benefit discussion w/ pt  - Chronic Pain consulted, appreciate recs, weaned off ketamine. Continuously asking for IV dilaudid.  Following pain management recs of prn oxycodone during the day and prn po dilaudid at night.  More IV dilaudid won't be the solution.  Needs long-acting agent which can be initiated with her pain management follow-up outpatient.  Severe malnutrition in setting of [name] and abdominal desmoid tumors  Patient has been on TPN twice before, administered through PICC lines which then were ultimately removed and central lines placed (at least once due to clot). She was recently discharged with a corpak and plan for tube feeds at home, but continued to lose weight and have difficulty with pain control and nausea. She was seen recently by Dr. [name] and her outpatient dietitian with plans to resume TPN and had waiting for a direct admission to do. Tunneled line placed 11/3 and she was started on TPN on 11/3. NG tube removed 11/4 due to no plans to continue tube feeds.   - Continue TPN daily. Dr. [name] has agreed to follow TPN orders/labs after discharge.    - Appreciate CM assistance with referral for home infusion  - VTE ppx per Heme recs while central line in place - lovenox 40 mg for now and possibly eliquis upon discharge.  Anticipate DC home on Wednesday 11/15 with home TPN.       [name]/Desmoid tumors. 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 Mucositis mixture and ammonium for hydration and symptomatic relief    Anxiety. Continue zyprexa 5 mg qhs, Atarax 25 q6prn, Remeron 7.5 at bedtime     FEN: Regular diet by mouth for comfort/as tolerated. TPN per dietician recommendations.   VTE ppx: Eliquis 2.5 mg BID   HCDM: Mensing,[name] - Mother - [phone]  HCDM, back-up: [name] - Father - [phone]    I personally spent 70 minutes face-to-face and non-face-to-face in the care of this patient, which includes all pre, intra, and post visit time on the date of service.  All documented time was specific to the E/M visit and does not include any procedures that may have been performed.    ___________________________________________________________________    Subjective:  Patient states she has significant pain which caused her to have a near-syncopal event.  Per patient, this is a common occurrence for her . Stated during the episode she had her chronic abdominal pain, nausea, and palpitations.       PRNs/24h:   HM po: 01/19/2022: 1816, 2345; 01/20/2022 - MAR held for procedure, now released.    HM 0.25 iv:  11/12:  552: 11/13:  given 0.5 mg at 11:30 am  Zofran iv 2155,  1439, 2155, 0552 on 11/12; 11/13: 554, 920     Labs/Studies:  Labs and Studies from the last 24hrs per EMR and Reviewed    Hgb (11/7 - 11/12)  11.7>10.9>10.1>9.9>8.5>8.7>8.3     Objective:  Temp:  [35.6 ??C (96.1 ??F)-36.8 ??C (98.3 ??F)] 35.6 ??C (96.1 ??F)  Heart Rate:  [93-134] 104  Resp:  [16-18] 16  BP: (105-121)/(55-66) 121/66  SpO2:  [99 %-100 %] 99 %  I repeated vitals myself:  HR was 110 lying down and 134 upon standing.  No orthostatic hypotension  Gen: Appears fatigued but alert and fully interactive, sitting up in bed, parents at bedside  ENT: MMM. Tunneled line in R anterior chest wall  Pulm: CTAB, normal WOB  Abd: NABS, soft, mild tend, nondistended, no rebound, no guarding.    Ext: WWP, no edema or cyanosis  Neuro: A&O x 3, no focal deficits  Psych: Appropriate mood and affect

## 2022-01-21 NOTE — Unmapped (Addendum)
With AYA social worker [name], saw [name] for ongoing support/assessment. [name] said she feels very agitated today. Describes it as feeling like she can't settle down. She knows that she is frustrated about being in the hospital, and that her pain control was impacted by the need to come off pain meds for her procedure. But, she doesn't recall feeling this on edge. Discussed the possibility that this (akathisia) could be medication induced and that I will reach out to her medical team about it, in case any culprits can be identified and modifications made.    - Sent EPIC chat message to hospitalist and pain/anesthesia.    Mariana Kaufman, AGPCNP-BC  Nurse Practitioner  Adolescent and Young Adult Cancer Program  [facility]  01/21/2022    AYA Assessment  Primary Caregiver: Parent  Location Seen: Inpatient  Contact Point: During treatment  Referral Source: AYA team  Issues Discussed: Symptom management, Mental Health  AYA Team Interventions: Supportive counseling, Treatment-specific information  Follow Up: As needed  Time Spent (in minutes): 25

## 2022-01-21 NOTE — Unmapped (Signed)
Department of Anesthesiology  Pain Medicine Division    Chronic Pain Followup Inpatient Consult Note    Requesting Attending Physician:  Edd Fabian, DO  Service Requesting Consult:  Med Bernita Raisin [facility])    Assessment/Recommendations:  The patient was seen in consultation on request of Arman Filter, MD regarding assistance with pain management. The patient is not obtaining adequate pain relief on current medication regimen.      The patient is a 21 y.o. 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 She was also started on TPN this admission per prior outpatient plan.       Chronic pain team was consulted as patient is having acute on chronic pain.  Patient recently received bilateral celiac plexus blocks on November 1 as an outpatient.  Patient reported having right-sided flank and right lower quadrant pain that was thought to be muscle spasms.  Patient was sent to the ER by [name] for possible evaluation of a perforation.  Imaging in the ED was negative for any findings.  In the past patient has had a history of having significant pain flares after trigger point injections that continues for 1 day.  She has used Toradol before which does help with pain relief.       We discussed completing the IV ketamine infusion and weaning off the IV dilaudid during this hospitalization.    Interval: The patient continues to have inadequate pain control with PO dilaudid. Required multiple doses of IV dilaudid for breakthrough pain. Patient believes that her cleanout and procedure as acutely worsened her pain. Patient amenable to PRN IV dilaudid today with switch to PRN oxycodone tomorrow. She was prescribed oxycodone 10mg  q4 PRN and PO dilaudid 3mg  nightly by her outpatient pain physician. Will trial IV dilaudid today with transition to PO oxycodone and dilaudid tomorrow after discussion with primary team. Formal recommendations are below.     Of note, there is no evidence of hepatic or renal dysfunction on laboratory studies.    Recommendations:  -The chronic pain service is a consult service and does not place orders, just makes recommendations (except ketamine and lidocaine infusions)   -Please evaluate all patients on opioids for appropriateness of prescribing narcan at discharge.  The chronic pain service can assist with this.  Nasal narcan is covered by most insurances.  -Recommendations given apply to the current hospitalization and do not reflect long term recommendations.    Changes to current analgesic regimen:  -Recommend IV dilaudid 0.5mg  q4hr x24 hours (until tomorrow ~6AM)  -Transition to oxycodone 10mg  q4 PRN and PO dilaudid 3mg  PRN nightly tomorrow AM  -Please order a CMP instead of a BMP on 11/15 to monitor liver function given that the patient completed a ketamine infusion  -Consider addition of simethicone to assist with possible gas pain/abdominal cramping from GI prep solution      Continue without changes:  -Continue acetaminophen 1000 mg every 8 hours scheduled  -Continue Voltaren 1% gel up to 4 times per day as needed for arthritic and muscular pain  -Continue baclofen 10 mg oral tablet every 8 hours scheduled  -Continue gabapentin 200 oral capsule every night scheduled  -Continue lidocaine 4% patch for 12 hours a day on areas of pain      We will continue to follow.    Naloxone Rx at discharge?  Is  patient on opioids? Yes.  1)Is dose >50MME?  Yes.  2) Is patient prescribed a benzodiazepine (w opioids)? No.  3)Hx of overdose?  No.  4) Hx of substance use disorder? No.  5) Opioids likely to last greater than a week after discharge? Yes.     If yes to 2 or more, prescribe naloxone at discharge.  Nasal narcan for most insured (Nasal narcan 4mg /actuation, prescribe 1 kit, instructions at [email or web address]).  For uninsured, chronic pain can work to assist in finding an option.  OTC nasal narcan now available at most pharmacies for around $45.    Interim History  There were no Acute Events Overnight.  [name] stated that she was still feeling uncomfortable this morning. Pain continues to be in lower epigastric region. Feels that IV dilaudid helps more than PO.     Analgesia Evaluation:  Pain at minimum:  5/10  Pain at maximum: 9/10    Current pain medication regimen (including how frequent PRN's were used):  -Gabapentin 200 mg at night  -Baclofen 10 mg 3 times daily  -Dilaudid 3 mg every 4 hours as needed x 3  -Dilaudid 0.15 mg IV q6h as needed x3  -Diclofenac gel as needed  -Ketamine gtt        Inpatient Medications  Current Facility-Administered Medications   Medication Dose Route Frequency Provider Last Rate Last Admin    acetaminophen (TYLENOL) tablet 1,000 mg  1,000 mg Oral Q8H Janjua, Ejaz Arshad, DO   1,000 mg at 01/21/22 0531    ammonium lactate (LAC-HYDRIN) 12 % lotion 1 application.  1 application. Topical BID Edd Fabian, DO   1 application. at 01/21/22 0916    apixaban (ELIQUIS) tablet 2.5 mg  2.5 mg Oral BID Edd Fabian, DO   2.5 mg at 01/21/22 0859    baclofen (LIORESAL) tablet 10 mg  10 mg Oral Q8H Janjua, Ejaz Arshad, DO   10 mg at 01/21/22 0531    cefdinir (OMNICEF) capsule 300 mg  300 mg Oral BID Edd Fabian, DO   300 mg at 01/21/22 4540    diclofenac sodium (VOLTAREN) 1 % gel 2 g  2 g Topical QID PRN Edd Fabian, DO        diphenhydrAMINE (BENADRYL) capsule/tablet 50 mg  50 mg Oral Q6H PRN Edd Fabian, DO   50 mg at 01/18/22 2134    famotidine (PEPCID) tablet 20 mg  20 mg Oral Nightly Edd Fabian, DO   20 mg at 01/20/22 2210    Parenteral Nutrition (CENTRAL)   Intravenous Continuous Edd Fabian, DO 80 mL/hr at 01/20/22 2340 New Bag at 01/20/22 2340    And    fat emulsion 20 % with fish oil (SMOFLIPID) infusion 250 mL  250 mL Intravenous Continuous Edd Fabian, DO 20.8 mL/hr at 01/20/22 2342 250 mL at 01/20/22 2342    [START ON 01/22/2022] Parenteral Nutrition (CENTRAL)   Intravenous Continuous Edd Fabian, DO        And    [START ON 01/22/2022] fat emulsion 20 % with fish oil (SMOFLIPID) infusion 250 mL  250 mL Intravenous Continuous Janjua, Ronald Pippins, DO        gabapentin (NEURONTIN) capsule 200 mg  200 mg Oral Nightly Edd Fabian, DO   200 mg at 01/20/22 2211    hydrocortisone (ANUSOL-HC) suppository 25 mg  25 mg Rectal BID PRN Edd Fabian, DO        hydrocortisone 1 %  cream   Topical BID PRN Edd Fabian, DO   Given at 01/19/22 1003    HYDROmorphone (DILAUDID) tablet 3 mg  3 mg Oral Q4H PRN Edd Fabian, DO   3 mg at 01/21/22 0531    HYDROmorphone (PF) (DILAUDID) injection 0.5 mg  0.5 mg Intravenous Q4H PRN Edd Fabian, DO        hydrOXYzine (ATARAX) tablet 50 mg  50 mg Oral Q6H PRN Edd Fabian, DO        hyoscyamine (LEVSIN) tablet 0.125 mg  0.125 mg Oral Q4H PRN Edd Fabian, DO        lactated Ringers infusion  10 mL/hr Intravenous Continuous Edd Fabian, DO 200 mL/hr at 01/20/22 0916 Restarted at 01/20/22 0942    lidocaine 4 % patch 2 patch  2 patch Transdermal Daily Edd Fabian, DO   2 patch at 01/21/22 0981    magic mouthwash oral suspension (diphenhydrAMINE, nystatin) 10 mL  10 mL Oral QID PRN Edd Fabian, DO   10 mL at 01/19/22 2208    melatonin tablet 3 mg  3 mg Oral Nightly PRN Edd Fabian, DO        mirtazapine (REMERON) tablet 7.5 mg  7.5 mg Oral Nightly Edd Fabian, DO   7.5 mg at 01/20/22 2211    naloxone [facility]) injection 0.1 mg  0.1 mg Intravenous Q5 Min PRN Edd Fabian, DO        NIFEdipine-lidocaine 0.3%-1.5% in petrolatum ointment   Topical BID Edd Fabian, DO   Given at 01/20/22 2211    OLANZapine zydis (ZyPREXA) disintegrating tablet 5 mg  5 mg Oral Nightly Edd Fabian, DO   5 mg at 01/20/22 2212    ondansetron (ZOFRAN-ODT) disintegrating tablet 4 mg  4 mg Oral Q6H PRN Edd Fabian, DO        Or    ondansetron (ZOFRAN) injection 4 mg  4 mg Intravenous Q6H PRN Edd Fabian, DO   4 mg at 01/21/22 0855    [START ON 01/22/2022] oxyCODONE (ROXICODONE) immediate release tablet 10 mg  10 mg Oral Q4H PRN Edd Fabian, DO        senna (SENOKOT) tablet 2 tablet  2 tablet Oral Nightly PRN Edd Fabian, DO        simethicone (MYLICON) chewable tablet 160 mg  160 mg Oral QID PRN Edd Fabian, DO   160 mg at 01/19/22 2041    sodium chloride (NS) 0.9 % infusion  10 mL/hr Intravenous Continuous Edd Fabian, DO 10 mL/hr at 01/13/22 1342 10 mL/hr at 01/13/22 1342    SORAfenib (NexAVAR) tablet 200 mg **patient supplied**  200 mg Oral Daily Edd Fabian, DO   200 mg at 01/21/22 0921    tacrolimus (PROTOPIC) 0.1 % ointment 1 application.  1 application. Topical BID PRN Edd Fabian, DO        triamcinolone (KENALOG) 0.1 % ointment   Topical BID PRN Edd Fabian, DO   Given at 01/19/22 1003         Objective:     Vital Signs    Temp:  [35.6 ??C (96.1 ??F)-36.8 ??C (98.3 ??F)] 35.6 ??C (96.1 ??F)  Heart Rate:  [93-134] 104  Resp:  [16-18] 16  BP: (105-121)/(55-66) 121/66  MAP (mmHg):  [71-81] 71  SpO2:  [99 %-100 %] 99 %      Physical Exam    GENERAL:  Well developed, well-nourished female and is in no apparent distress.  HEAD/NECK:    Reveals normocephalic/atraumatic.   CARDIOVASCULAR:   Warm, well perfused  LUNGS:   Normal work of breathing  EXTREMITIES:  Warm, no clubbing, cyanosis, or edema was noted.   ABDOMEN:  tender to deep palpation  NEUROLOGIC:    The patient was alert and oriented times four with normal language, attention, cognition and memory. Cranial nerve exam was grossly normal.  Sensation Intact to light touch throughout the bilateral upper and lower extremities.  MUSCULOSKELETAL:    Motor function  5/5 in upper and lower extremities. SKIN:  No obvious rashes lesions or erythema  PSY:  Appropriate affect and mood.    Test Results    Lab Results   Component Value Date    CREATININE 0.36 (L) 01/21/2022     Lab Results   Component Value Date    ALKPHOS 39 (L) 01/18/2022    BILITOT 0.3 01/18/2022    BILIDIR 0.20 12/04/2021    PROT 5.1 (L) 01/18/2022    ALBUMIN 2.9 (L) 01/18/2022    ALT 18 01/18/2022    AST 20 01/18/2022           Problem List    Principal Problem:    Intractable abdominal pain  Active Problems:    [name]    [name]    Dehydration    Severe protein-calorie malnutrition (CMS-HCC)

## 2022-01-21 NOTE — Unmapped (Signed)
Problem: Adult Inpatient Plan of Care  Goal: Plan of Care Review  Outcome: Progressing  Goal: Patient-Specific Goal (Individualized)  Outcome: Progressing  Goal: Absence of Hospital-Acquired Illness or Injury  Outcome: Progressing  Intervention: Identify and Manage Fall Risk  Recent Flowsheet Documentation  Taken 01/20/2022 2000 by Phineas Real, RN  Safety Interventions:   family at bedside   low bed   lighting adjusted for tasks/safety  Intervention: Prevent Skin Injury  Recent Flowsheet Documentation  Taken 01/20/2022 2000 by Phineas Real, RN  Positioning for Skin: Supine/Back  Device Skin Pressure Protection: adhesive use limited  Skin Protection: adhesive use limited  Intervention: Prevent and Manage VTE (Venous Thromboembolism) Risk  Recent Flowsheet Documentation  Taken 01/20/2022 2000 by Phineas Real, RN  VTE Prevention/Management: anticoagulant therapy  Goal: Optimal Comfort and Wellbeing  Outcome: Progressing  Goal: Readiness for Transition of Care  Outcome: Progressing  Goal: Rounds/Family Conference  Outcome: Progressing     Problem: Fall Injury Risk  Goal: Absence of Fall and Fall-Related Injury  Outcome: Progressing  Intervention: Identify and Manage Contributors  Recent Flowsheet Documentation  Taken 01/20/2022 2000 by Phineas Real, RN  Self-Care Promotion: independence encouraged  Intervention: Promote Injury-Free Environment  Recent Flowsheet Documentation  Taken 01/20/2022 2000 by Phineas Real, RN  Safety Interventions:   family at bedside   low bed   lighting adjusted for tasks/safety     Problem: Malnutrition  Goal: Improved Nutritional Intake  Outcome: Progressing     Problem: Self-Care Deficit  Goal: Improved Ability to Complete Activities of Daily Living  Outcome: Progressing  Intervention: Promote Activity and Functional Independence  Recent Flowsheet Documentation  Taken 01/20/2022 2000 by Phineas Real, RN  Self-Care Promotion: independence encouraged     Problem: Latex Allergy  Goal: Absence of Allergy Symptoms  Outcome: Progressing

## 2022-01-21 NOTE — Unmapped (Signed)
Adolescent and Young Adult Cancer Program Visit     Service date: January 17, 2022     Encounter location: Inpatient - Adult     Clinician: [name], LCSW - AYA Clinical Social Worker     Patient identifiers: [name] [name] is a 21 y.o. with desmoid tumors and [name]. [name] [name] uses she/her pronouns.     Visit Summary     Met with the patient for scheduled supportive counseling.    Shani and I discussed challenges with navigating multiple groups of providers, and explored strategies for self-advocacy and enhancing communication with her providers. Provided validation of challenges of complex inpatient admissions, which have been difficult for her this year. Explored differences between adult and pediatric medicine, which she is much more accustomed to with her long medical history, and ways she can navigate working with providers who do not know her as well.    Follow Up/Plan:     Plan to see next week.    I will continue to follow this patient for AYA-appropriate support. They have my contact information and have been encouraged to contact me as needed.     Flowsheet     AYA Assessment  Primary Caregiver: Parent  Location Seen: Inpatient  Contact Point: During treatment  Referral Source: Self-Referred  Issues Discussed: Mental Health, Concerns with care or care team, Family concerns / conflict  AYA Team Interventions: Psychoeducation, Supportive counseling, Goals of care support and communication, Adapting care to AYA needs  Follow Up: 1 week  Time Spent (in minutes): 35    01/20/2022     [name], LCSW  Adolescent and Young Adult Clinical Social Worker  Phone: [phone]  Email: [email or web address]

## 2022-01-21 NOTE — Unmapped (Signed)
Pt plan of care discussed, verbalizes understanding. Pain management effective, no adverse reactions noted. Anxiety noted and taking prn med. Tolerates diet ok.   Problem: Adult Inpatient Plan of Care  Goal: Plan of Care Review  Outcome: Ongoing - Unchanged  Goal: Patient-Specific Goal (Individualized)  Outcome: Ongoing - Unchanged  Goal: Absence of Hospital-Acquired Illness or Injury  Outcome: Ongoing - Unchanged  Goal: Optimal Comfort and Wellbeing  Outcome: Ongoing - Unchanged  Goal: Readiness for Transition of Care  Outcome: Ongoing - Unchanged  Goal: Rounds/Family Conference  Outcome: Ongoing - Unchanged     Problem: Fall Injury Risk  Goal: Absence of Fall and Fall-Related Injury  Outcome: Ongoing - Unchanged     Problem: Malnutrition  Goal: Improved Nutritional Intake  Outcome: Ongoing - Unchanged

## 2022-01-21 NOTE — Unmapped (Signed)
Problem: Adult Inpatient Plan of Care  Goal: Plan of Care Review  Outcome: Progressing  Goal: Patient-Specific Goal (Individualized)  Outcome: Progressing  Goal: Absence of Hospital-Acquired Illness or Injury  Outcome: Progressing  Intervention: Prevent Skin Injury  Recent Flowsheet Documentation  Taken 01/20/2022 1154 by Monica Martinez, RN  Positioning for Skin: Supine/Back  Taken 01/20/2022 0730 by Monica Martinez, RN  Positioning for Skin: Supine/Back  Skin Protection:   adhesive use limited   incontinence pads utilized  Goal: Optimal Comfort and Wellbeing  Outcome: Progressing  Goal: Readiness for Transition of Care  Outcome: Progressing  Goal: Rounds/Family Conference  Outcome: Progressing     Problem: Fall Injury Risk  Goal: Absence of Fall and Fall-Related Injury  Outcome: Progressing     Problem: Malnutrition  Goal: Improved Nutritional Intake  Outcome: Progressing     Problem: Self-Care Deficit  Goal: Improved Ability to Complete Activities of Daily Living  Outcome: Progressing     Problem: Latex Allergy  Goal: Absence of Allergy Symptoms  Outcome: Progressing

## 2022-01-22 LAB — HEMOGLOBIN AND HEMATOCRIT, BLOOD
HEMATOCRIT: 26.2 % — ABNORMAL LOW (ref 34.0–44.0)
HEMOGLOBIN: 8.8 g/dL — ABNORMAL LOW (ref 11.3–14.9)

## 2022-01-22 LAB — MAGNESIUM: MAGNESIUM: 2.1 mg/dL (ref 1.6–2.6)

## 2022-01-22 LAB — PHOSPHORUS: PHOSPHORUS: 4 mg/dL (ref 2.4–5.1)

## 2022-01-22 MED ORDER — GABAPENTIN 100 MG CAPSULE
ORAL_CAPSULE | Freq: Every evening | ORAL | 0 refills | 30 days | Status: CP
Start: 2022-01-22 — End: 2022-02-21

## 2022-01-22 MED ORDER — HYDROXYZINE HCL 50 MG TABLET
ORAL_TABLET | Freq: Four times a day (QID) | ORAL | 0 refills | 8 days | Status: CP | PRN
Start: 2022-01-22 — End: ?

## 2022-01-22 MED ORDER — HYDROCORTISONE ACETATE 25 MG RECTAL SUPPOSITORY
Freq: Two times a day (BID) | RECTAL | 0 refills | 15 days | Status: CP | PRN
Start: 2022-01-22 — End: 2022-02-21

## 2022-01-22 MED ORDER — NIFED 0.3%/LIDOCA 1.5% W/PETRO
Freq: Two times a day (BID) | TOPICAL | 0 refills | 30 days | Status: CP
Start: 2022-01-22 — End: 2022-02-21

## 2022-01-22 MED ORDER — APIXABAN 2.5 MG TABLET
ORAL_TABLET | Freq: Two times a day (BID) | ORAL | 0 refills | 30 days | Status: CP
Start: 2022-01-22 — End: ?

## 2022-01-22 MED ADMIN — cefdinir (OMNICEF) capsule 300 mg: 300 mg | ORAL | @ 03:00:00 | Stop: 2022-02-09

## 2022-01-22 MED ADMIN — OLANZapine zydis (ZyPREXA) disintegrating tablet 5 mg: 5 mg | ORAL | @ 03:00:00

## 2022-01-22 MED ADMIN — HYDROmorphone (PF) (DILAUDID) injection 0.5 mg: .5 mg | INTRAVENOUS | @ 03:00:00 | Stop: 2022-01-22

## 2022-01-22 MED ADMIN — hydrOXYzine (ATARAX) tablet 50 mg: 50 mg | ORAL | @ 01:00:00

## 2022-01-22 MED ADMIN — HYDROmorphone (PF) (DILAUDID) injection 0.5 mg: .5 mg | INTRAVENOUS | @ 11:00:00 | Stop: 2022-01-22

## 2022-01-22 MED ADMIN — gabapentin (NEURONTIN) capsule 200 mg: 200 mg | ORAL | @ 03:00:00

## 2022-01-22 MED ADMIN — oxyCODONE (ROXICODONE) immediate release tablet 10 mg: 10 mg | ORAL | @ 19:00:00 | Stop: 2022-01-22

## 2022-01-22 MED ADMIN — oxyCODONE (ROXICODONE) immediate release tablet 10 mg: 10 mg | ORAL | @ 01:00:00 | Stop: 2022-01-26

## 2022-01-22 MED ADMIN — lidocaine 4 % patch 2 patch: 2 | TRANSDERMAL | @ 14:00:00 | Stop: 2022-01-22

## 2022-01-22 MED ADMIN — mirtazapine (REMERON) tablet 7.5 mg: 7.5 mg | ORAL | @ 03:00:00

## 2022-01-22 MED ADMIN — NIFEdipine-lidocaine 0.3%-1.5% in petrolatum ointment: TOPICAL | @ 14:00:00 | Stop: 2022-01-22

## 2022-01-22 MED ADMIN — oxyCODONE (ROXICODONE) immediate release tablet 10 mg: 10 mg | ORAL | @ 14:00:00 | Stop: 2022-01-22

## 2022-01-22 MED ADMIN — baclofen (LIORESAL) tablet 10 mg: 10 mg | ORAL | @ 03:00:00

## 2022-01-22 MED ADMIN — baclofen (LIORESAL) tablet 10 mg: 10 mg | ORAL | @ 19:00:00 | Stop: 2022-01-22

## 2022-01-22 MED ADMIN — acetaminophen (TYLENOL) tablet 1,000 mg: 1000 mg | ORAL | @ 11:00:00 | Stop: 2022-01-22

## 2022-01-22 MED ADMIN — SORAfenib (NexAVAR) tablet 200 mg **patient supplied**: 200 mg | ORAL | @ 14:00:00 | Stop: 2022-01-22

## 2022-01-22 MED ADMIN — apixaban (ELIQUIS) tablet 2.5 mg: 2.5 mg | ORAL | @ 03:00:00

## 2022-01-22 MED ADMIN — famotidine (PEPCID) tablet 20 mg: 20 mg | ORAL | @ 03:00:00

## 2022-01-22 MED ADMIN — NIFEdipine-lidocaine 0.3%-1.5% in petrolatum ointment: TOPICAL | @ 03:00:00

## 2022-01-22 MED ADMIN — heparin, porcine (PF) 100 unit/mL injection 200 Units: 200 [IU] | INTRAVENOUS | @ 22:00:00 | Stop: 2022-01-22

## 2022-01-22 MED ADMIN — acetaminophen (TYLENOL) tablet 1,000 mg: 1000 mg | ORAL | @ 19:00:00 | Stop: 2022-01-22

## 2022-01-22 MED ADMIN — cefdinir (OMNICEF) capsule 300 mg: 300 mg | ORAL | @ 14:00:00 | Stop: 2022-01-22

## 2022-01-22 MED ADMIN — ammonium lactate (LAC-HYDRIN) 12 % lotion 1 application.: 1 | TOPICAL | @ 14:00:00 | Stop: 2022-01-22

## 2022-01-22 MED ADMIN — acetaminophen (TYLENOL) tablet 1,000 mg: 1000 mg | ORAL | @ 03:00:00

## 2022-01-22 MED ADMIN — baclofen (LIORESAL) tablet 10 mg: 10 mg | ORAL | @ 11:00:00 | Stop: 2022-01-22

## 2022-01-22 MED ADMIN — hydrOXYzine (ATARAX) tablet 50 mg: 50 mg | ORAL | @ 16:00:00 | Stop: 2022-01-22

## 2022-01-22 MED ADMIN — apixaban (ELIQUIS) tablet 2.5 mg: 2.5 mg | ORAL | @ 14:00:00 | Stop: 2022-01-22

## 2022-01-22 NOTE — Unmapped (Signed)
Problem: Adult Inpatient Plan of Care  Goal: Plan of Care Review  Outcome: Ongoing - Unchanged  Goal: Patient-Specific Goal (Individualized)  Outcome: Ongoing - Unchanged  Goal: Absence of Hospital-Acquired Illness or Injury  Outcome: Ongoing - Unchanged  Intervention: Identify and Manage Fall Risk  Recent Flowsheet Documentation  Taken 01/21/2022 2000 by Sabino Dick, RN  Safety Interventions: family at bedside  Intervention: Prevent Skin Injury  Recent Flowsheet Documentation  Taken 01/21/2022 2009 by Sabino Dick, RN  Positioning for Skin: Supine/Back  Device Skin Pressure Protection: adhesive use limited  Skin Protection: adhesive use limited  Taken 01/21/2022 2000 by Sabino Dick, RN  Positioning for Skin: Supine/Back  Device Skin Pressure Protection: adhesive use limited  Skin Protection: adhesive use limited  Intervention: Prevent Infection  Recent Flowsheet Documentation  Taken 01/21/2022 2000 by Sabino Dick, RN  Infection Prevention: hand hygiene promoted  Goal: Optimal Comfort and Wellbeing  Outcome: Ongoing - Unchanged  Goal: Readiness for Transition of Care  Outcome: Ongoing - Unchanged  Goal: Rounds/Family Conference  Outcome: Ongoing - Unchanged     Problem: Fall Injury Risk  Goal: Absence of Fall and Fall-Related Injury  Outcome: Ongoing - Unchanged  Intervention: Promote Injury-Free Environment  Recent Flowsheet Documentation  Taken 01/21/2022 2000 by Sabino Dick, RN  Safety Interventions: family at bedside     Problem: Malnutrition  Goal: Improved Nutritional Intake  Outcome: Ongoing - Unchanged     Problem: Self-Care Deficit  Goal: Improved Ability to Complete Activities of Daily Living  Outcome: Ongoing - Unchanged     Problem: Latex Allergy  Goal: Absence of Allergy Symptoms  Outcome: Ongoing - Unchanged

## 2022-01-22 NOTE — Unmapped (Signed)
Gastroenterology (Luminal) Consult Service   Initial Consultation         Assessment and Recommendations:   [name] [name] is a 21 y.o. female with a PMHx of [name] s/p IPAA  that presented to [facility] with pain. The patient is seen in consultation at the request of Edd Fabian, DO ([facility])) for hematochezia    Patient with hematochezia likely secondary to anastomotic ulcer and anal fissure in conjunction with blood thinner. Patient's Hgb continues to uptrend and is 8.8 today. She did have blood with brown stool today, which is expected given colonoscopic findings of ulcer and fissure. Recommend continued treatment of anal fissure as below.     - Treatment for anal fissure:  --- Pea sized amount of topical nifedipine (0.3% nifedipine + 2% lidocaine) applied to the finger and then placed in the anus, up to the first knuckle, twice a day. Anticipate use for 8 weeks  - Discussed signs and symptoms that would warrant return to care including dizziness, lightheadedness, passing clots from below, blood without bowel movement  - Have sent message to outpatient GI, Dr. [name] to coordinate follow up appointment    Recommendations discussed with the patient's primary team. We will sign-off at this time, please re-contact if additional questions or a new need for consultation arises.    For questions, contact the on-call fellow for the Gastroenterology (Luminal) Consult Service.    Subjective:   Reports two episodes of blood mixed with brown stool. No dizziness or lightheadedness.  Answered her's and mother's questions.     Objective:   Temp:  [35.7 ??C (96.3 ??F)-36 ??C (96.8 ??F)] 35.7 ??C (96.3 ??F)  Heart Rate:  [83-108] 108  Resp:  [18-20] 20  BP: (109-115)/(53-57) 115/55  SpO2:  [97 %-98 %] 98 %    Gen: NAD, answers questions appropriately  Abdomen: Soft, nondistended, mildly TTP, no rebound or guarding  Extremities: No edema in the BLEs    Pertinent Labs/Studies Reviewed:  Hgb 8.8

## 2022-01-22 NOTE — Unmapped (Signed)
[facility] Clinical Assessment & Refill Coordination Note    [name] [name], DOB: November 27, 2000  Phone: [phone] (home) [phone] (work)    All above HIPAA information was verified with patient's family member, mom.     Was a Nurse, learning disability used for this call? No    Specialty Medication(s):   Hematology/Oncology: Nexavar     No current facility-administered medications for this visit.     Current Outpatient Medications   Medication Sig Dispense Refill    apixaban (ELIQUIS) 2.5 mg Tab Take 1 tablet (2.5 mg total) by mouth two (2) times a day. 60 tablet 0    diphenhydrAMINE (BENADRYL) 50 mg capsule Take 1 capsule (50 mg total) by mouth every six (6) hours as needed for itching (per patient preference (if prefers over hydroxyzine)). 30 each 0    gabapentin (NEURONTIN) 100 MG capsule Take 2 capsules (200 mg total) by mouth nightly. 60 capsule 0    hydrocortisone (ANUSOL-HC) 25 mg suppository Insert 1 suppository (25 mg total) into the rectum two (2) times a day as needed for hemorrhoids (possible rectal/anal bleeding source). 30 suppository 0    hydrOXYzine (ATARAX) 50 MG tablet Take 1 tablet (50 mg total) by mouth every six (6) hours as needed for itching or anxiety (also insomnia). 30 tablet 0    NIFEdipine 0.3% lidocaine 1.5% in petrolatum ointment Apply topically two (2) times a day. 30 g 0     Facility-Administered Medications Ordered in Other Visits   Medication Dose Route Frequency Provider Last Rate Last Admin    acetaminophen (TYLENOL) tablet 1,000 mg  1,000 mg Oral Q8H Janjua, Ejaz Arshad, DO   1,000 mg at 01/22/22 1353    ammonium lactate (LAC-HYDRIN) 12 % lotion 1 application.  1 application. Topical BID Edd Fabian, DO   1 application. at 01/22/22 0925    apixaban (ELIQUIS) tablet 2.5 mg  2.5 mg Oral BID Edd Fabian, DO   2.5 mg at 01/22/22 0920    baclofen (LIORESAL) tablet 10 mg  10 mg Oral Q8H Janjua, Ejaz Arshad, DO   10 mg at 01/22/22 1354 cefdinir (OMNICEF) capsule 300 mg  300 mg Oral BID Edd Fabian, DO   300 mg at 01/22/22 2130    diclofenac sodium (VOLTAREN) 1 % gel 2 g  2 g Topical QID PRN Edd Fabian, DO        diphenhydrAMINE (BENADRYL) capsule/tablet 50 mg  50 mg Oral Q6H PRN Edd Fabian, DO   50 mg at 01/18/22 2134    famotidine (PEPCID) tablet 20 mg  20 mg Oral Nightly Edd Fabian, DO   20 mg at 01/21/22 2204    gabapentin (NEURONTIN) capsule 200 mg  200 mg Oral Nightly Edd Fabian, DO   200 mg at 01/21/22 2205    hydrocortisone (ANUSOL-HC) suppository 25 mg  25 mg Rectal BID PRN Edd Fabian, DO        hydrocortisone 1 % cream   Topical BID PRN Edd Fabian, DO   Given at 01/19/22 1003    HYDROmorphone (DILAUDID) tablet 3 mg  3 mg Oral Nightly PRN Edd Fabian, DO        hydrOXYzine (ATARAX) tablet 50 mg  50 mg Oral Q6H PRN Edd Fabian, DO   50 mg at 01/22/22 1100    hyoscyamine (LEVSIN) tablet 0.125 mg  0.125 mg Oral Q4H PRN Edd Fabian, DO  lactated Ringers infusion  10 mL/hr Intravenous Continuous Edd Fabian, DO 200 mL/hr at 01/20/22 0916 Restarted at 01/20/22 0942    lidocaine 4 % patch 2 patch  2 patch Transdermal Daily Edd Fabian, DO   2 patch at 01/22/22 1610    magic mouthwash oral suspension (diphenhydrAMINE, nystatin) 10 mL  10 mL Oral QID PRN Edd Fabian, DO   10 mL at 01/19/22 2208    melatonin tablet 3 mg  3 mg Oral Nightly PRN Edd Fabian, DO        mirtazapine (REMERON) tablet 7.5 mg  7.5 mg Oral Nightly Edd Fabian, DO   7.5 mg at 01/21/22 2204    naloxone [facility]) injection 0.1 mg  0.1 mg Intravenous Q5 Min PRN Edd Fabian, DO        NIFEdipine-lidocaine 0.3%-1.5% in petrolatum ointment   Topical BID Edd Fabian, DO   Given at 01/22/22 0926    OLANZapine zydis (ZyPREXA) disintegrating tablet 5 mg  5 mg Oral Nightly Edd Fabian, DO   5 mg at 01/21/22 2205    ondansetron (ZOFRAN-ODT) disintegrating tablet 4 mg  4 mg Oral Q6H PRN Edd Fabian, DO        Or    ondansetron (ZOFRAN) injection 4 mg  4 mg Intravenous Q6H PRN Edd Fabian, DO   4 mg at 01/21/22 1736    oxyCODONE (ROXICODONE) immediate release tablet 10 mg  10 mg Oral Q4H PRN Edd Fabian, DO   10 mg at 01/22/22 1353    Parenteral Nutrition (CENTRAL)   Intravenous Continuous Edd Fabian, DO   Stopped at 01/22/22 1200    senna (SENOKOT) tablet 2 tablet  2 tablet Oral Nightly PRN Edd Fabian, DO        simethicone (MYLICON) chewable tablet 160 mg  160 mg Oral QID PRN Edd Fabian, DO   160 mg at 01/19/22 2041    sodium chloride (NS) 0.9 % infusion  10 mL/hr Intravenous Continuous Edd Fabian, DO 10 mL/hr at 01/13/22 1342 10 mL/hr at 01/13/22 1342    SORAfenib (NexAVAR) tablet 200 mg **patient supplied**  200 mg Oral Daily Edd Fabian, DO   200 mg at 01/22/22 9604    tacrolimus (PROTOPIC) 0.1 % ointment 1 application.  1 application. Topical BID PRN Edd Fabian, DO        triamcinolone (KENALOG) 0.1 % ointment   Topical BID PRN Edd Fabian, DO   Given at 01/19/22 1003        Changes to medications: [name] reports no changes at this time.    Allergies   Allergen Reactions    Adhesive Tape-Silicones Hives and Rash     Paper tape  And tegederm ok    Ferrlecit [Sodium Ferric Gluconat-Sucrose] Swelling and Rash    Levofloxacin Swelling and Rash     Swelling in mouth, rash,     Methylnaltrexone      Per Patient: I lost bowel control, severe abdominal cramping, and elevated BP    Neomycin Swelling     Rxn after ear drops; ear swelling    Papaya Hives    Morphine Nausea And Vomiting    Zosyn [Piperacillin-Tazobactam] Itching and Rash     Red and itchy    Compazine [Prochlorperazine] Other (See Comments)     Extreme agitation    Latex, Natural Rubber Rash       Changes to allergies:  No    SPECIALTY MEDICATION ADHERENCE     Nexavar 200 mg: 2 days of medicine on hand     Medication Adherence    Patient reported X missed doses in the last month: 0  Specialty Medication: sorafenib 200 mg once daily  Patient is on additional specialty medications: No  Informant: mother                  Confirmed plan for next specialty medication refill: delivery by pharmacy  Refills needed for supportive medications: not needed          Specialty medication(s) dose(s) confirmed: Regimen is correct and unchanged.     Are there any concerns with adherence? No    Adherence counseling provided? Not needed    CLINICAL MANAGEMENT AND INTERVENTION      Clinical Benefit Assessment:    Do you feel the medicine is effective or helping your condition? Yes    Clinical Benefit counseling provided? Not needed    Adverse Effects Assessment:    Are you experiencing any side effects? No    Are you experiencing difficulty administering your medicine? No    Quality of Life Assessment:    Quality of Life    Rheumatology  Oncology  Dermatology  Cystic Fibrosis          How many days over the past month did your [name]  keep you from your normal activities? For example, brushing your teeth or getting up in the morning. Currently admitted    Have you discussed this with your provider? Yes    Acute Infection Status:    Acute infections noted within Epic:  No active infections  Patient reported infection: None    Therapy Appropriateness:    Is therapy appropriate and patient progressing towards therapeutic goals? Yes, therapy is appropriate and should be continued    DISEASE/MEDICATION-SPECIFIC INFORMATION      N/A    Oncology: Is the patient receiving adequate infection prevention treatment? Not applicable  Does the patient have adequate nutritional support? Not applicable    PATIENT SPECIFIC NEEDS     Does the patient have any physical, cognitive, or cultural barriers? No    Is the patient high risk? No    Did the patient require a clinical intervention? No    Does the patient require physician intervention or other additional services (i.e., nutrition, smoking cessation, social work)? No    SOCIAL DETERMINANTS OF HEALTH     At the [facility], we have learned that life circumstances - like trouble affording food, housing, utilities, or transportation can affect the health of many of our patients.   That is why we wanted to ask: are you currently experiencing any life circumstances that are negatively impacting your health and/or quality of life? Patient declined to answer    Social Determinants of Health     Financial Resource Strain: Low Risk  (08/30/2021)    Overall Financial Resource Strain (CARDIA)     Difficulty of Paying Living Expenses: Not very hard   Internet Connectivity: Not on file   Food Insecurity: No Food Insecurity (08/30/2021)    Hunger Vital Sign     Worried About Running Out of Food in the Last Year: Never true     Ran Out of Food in the Last Year: Never true   Tobacco Use: Low Risk  (01/22/2022)    Patient History     Smoking Tobacco Use: Never     Smokeless  Tobacco Use: Never     Passive Exposure: Past   Housing/Utilities: Low Risk  (08/30/2021)    Housing/Utilities     Within the past 12 months, have you ever stayed: outside, in a car, in a tent, in an overnight shelter, or temporarily in someone else's home (i.e. couch-surfing)?: No     Are you worried about losing your housing?: No     Within the past 12 months, have you been unable to get utilities (heat, electricity) when it was really needed?: No   Alcohol Use: Not on file   Transportation Needs: No Transportation Needs (08/30/2021)    PRAPARE - Therapist, art (Medical): No     Lack of Transportation (Non-Medical): No   Substance Use: Not on file   Health Literacy: Not on file   Physical Activity: Not on file   Interpersonal Safety: Not on file   Stress: Not on file   Intimate Partner Violence: Not on file   Depression: Not on file   Social Connections: Not on file     Would you be willing to receive help with any of the needs that you have identified today? Not applicable     SHIPPING     Specialty Medication(s) to be Shipped:   Hematology/Oncology: Nexavar    Other medication(s) to be shipped: No additional medications requested for fill at this time     Changes to insurance: No    Delivery Scheduled: Yes, Expected medication delivery date: 01/24/22.     Medication will be delivered via UPS to the confirmed prescription address in [facility].    The patient will receive a drug information handout for each medication shipped and additional FDA Medication Guides as required.  Verified that patient has previously received a Conservation officer, historic buildings and a Surveyor, mining.    The patient or caregiver noted above participated in the development of this care plan and knows that they can request review of or adjustments to the care plan at any time.      All of the patient's questions and concerns have been addressed.    Kermit Balo, [facility]   [facility] Specialty Pharmacist

## 2022-01-22 NOTE — Unmapped (Signed)
Hospital Medicine Daily Progress Note    Assessment/Plan:    Principal Problem:    Intractable abdominal pain  Active Problems:    [name]    [name]    Dehydration    Severe protein-calorie malnutrition (CMS-HCC)  Resolved Problems:    * No resolved hospital problems. *   Malnutrition Evaluation as performed by RD, LDN: Severe Protein-Calorie Malnutrition in the context of chronic illness (01/17/22 1131)             [name] [name] is a 21 y.o. 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 She was also started on TPN this admission per prior outpatient plan. Hospital course complicated by GI bleed.    Near-syncope:  Appears to be vasovagal given pain and nausea associated with the episode.  POTS can't be excluded given HR jumped up to 134 from 110 upon standing; however, this is difficult to diagnose when she states she is in this amount of pain.  When on a long-acting agent outpatient and pain is better-controlled, can reassess for POTS at that time and see if midodrine or fludrocortisone could help.      Acute on chronic blood loss anemia  -secondary to possibly a solitary ulcer at the anastomotic site.  No evidence of pouchitis.  Avoid NSAIDs - this is contraindicated not only for the ulcer but given chronic, recurrent pouchitis.  No transfusions required this hospitalization.  Discussed in detail with GI fellow.  Onc does not feel strongly about desmoid tumor bleeding thus will not pursue a patency study at this time.  GI also stated that if the patency study passes it does not guarantee that the VCE will pass.  She has moderate to severe extrinsic compression/stenosis at the pre-pouch ileum.  Unclear etiology at this time.  No evidence of an obstruction nor a desmoid tumor at that site.  If abdominal pain significantly worsens, will re-image to assess for an obstruction.  If she experiences severe bleeding especially with hemodynamic compromise, transfer to ICU and get an emergent CTA.  Hgb stable for today.  No further bleeding so far.      Hx line associated DVT.   After a prolonged discussion with the patient and mother, eliquis started today.  Will monitor for bleeding today.  Per Heme recs, needs to be on this while she has a long-term CVC.        Acute on chronic abdominal pain  Abdominal desmoid tumors  Patient underwent celiac plexus block on 01/08/2022 with Dr. Manson [name] as an outpatient and presented with acute right flank and RLQ pain. Per outpatient notes, Dr. Manson [name] feels her acute pain response is most consistent with muscle spasms and referred pain from the spasms. CT showed small locule of gas in the left perirenal tract, likely post-procedural from block, but no renal or psoas injury or other acute findings. Patient continued to have severe pain requiring IV hydromorphone q3 hrs. Chronic Pain consulted and provided ketamine drip after careful risk/benefit discussion w/ pt  - Chronic Pain consulted, appreciate recs, weaned off ketamine. Continuously asking for IV dilaudid.  Following pain management recs of prn oxycodone during the day and prn po dilaudid at night.  More IV dilaudid won't be the solution.  Needs long-acting agent which can be initiated with her pain management follow-up outpatient.   Oncology follow-up  scheduled outpatient too.  Being in the hospital will only make her feel worse.  IV narcotics are not the solution to her issues.  Had two episodes of bright red blood and they look less bloody than prior to her EGD.  Suspect her fissure is causing this and she is on topical nifedipine-lidocaine per GI recs.  Discussed with GI who will speak with the patient and provide reassurance.      Severe malnutrition in setting of [name] and abdominal desmoid tumors  Patient has been on TPN twice before, administered through PICC lines which then were ultimately removed and central lines placed (at least once due to clot). She was recently discharged with a corpak and plan for tube feeds at home, but continued to lose weight and have difficulty with pain control and nausea. She was seen recently by Dr. [name] and her outpatient dietitian with plans to resume TPN and had waiting for a direct admission to do. Tunneled line placed 11/3 and she was started on TPN on 11/3. NG tube removed 11/4 due to no plans to continue tube feeds.   - Continue TPN daily. Dr. [name] has agreed to follow TPN orders/labs after discharge.    - Appreciate CM assistance with referral for home infusion - anticipate this will be arranged 11/16.    - VTE ppx per Heme recs while central line in place - lovenox 40 mg for now and possibly eliquis upon discharge.  Anticipate DC home on 11/16 with home TPN as this is what is holding dc right now.        [name]/Desmoid tumors. 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 Mucositis mixture and ammonium for hydration and symptomatic relief    Anxiety. Continue zyprexa 5 mg qhs, Atarax 25 q6prn, Remeron 7.5 at bedtime     FEN: Regular diet by mouth for comfort/as tolerated. TPN per dietician recommendations.   VTE ppx: Eliquis 2.5 mg BID   HCDM: [name],[name] - Mother - [phone]  HCDM, back-up: [name] [name] - Father - [phone]    I personally spent 70 minutes face-to-face and non-face-to-face in the care of this patient, which includes all pre, intra, and post visit time on the date of service.  All documented time was specific to the E/M visit and does not include any procedures that may have been performed.    ___________________________________________________________________    Subjective:  Earlier this morning, patient stated her pain was much better controlled.  Seen in the afternoon when about to be discharged and stated she has uncontrollable pain.  Also states she had two bowel movements with bright red blood.      PRNs/24h:   HM po: 01/19/2022: 1816, 2345; 01/20/2022 - MAR held for procedure, now released.    HM 0.25 iv:  11/12:  552: 11/13:  given 0.5 mg at 11:30 am  Zofran iv 2155,  1439, 2155, 0552 on 11/12; 11/13: 554, 920     Labs/Studies:  Hgb 8.8 today.      Objective:  Temp:  [35.8 ??C (96.5 ??F)-36 ??C (96.8 ??F)] 36 ??C (96.8 ??F)  Heart Rate:  [83-87] 83  Resp:  [18] 18  BP: (109-113)/(53-57) 109/53  SpO2:  [97 %-98 %] 98 %  Gen: Appears fatigued but alert and fully interactive, sitting up in bed, parents at bedside  ENT: MMM. Tunneled line in R anterior chest wall  Pulm: CTAB, normal WOB  Abd:  NABS, soft, mild tend, nondistended, no rebound, no guarding.    Ext: WWP, no edema or cyanosis  Neuro: A&O x 3, no focal deficits  Psych: Appropriate mood and affect

## 2022-01-22 NOTE — Unmapped (Addendum)
Oncology Treatment Plan Note    Primary Attending Physician:  Edd Fabian, DO  Primary Service: Med [facility])  Reason for Consult: Desmoid fibromatosis on sorafenib  Primary Oncologist: Dr. Donzetta Sprung     Assessment: [name] [name] is a 21 y.o. 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 Patient recently seen (10/24) by Dr. [name] with plan to continue sorafenib.     Patient with recent concerns for new mucositis/stomatitis (see image in media tab) as well as very mild dry scalp, dry skin of the face, dry skin of hands and dry skin of feet. Discussed with outpatient oncologist Dr. [name] and pharmacists Nicholos Johns and Sharlynn Oliphant - will continue sorafenib at  200 mg daily with magic mouth wash for mucositis and ammonium lactate BID for possible mild HFRS.    Patient with history of pouchitis with recent drop in Hgb, +FOBT, and and AUB in setting of [facility] use with initial concerns for recurrent pouchitis iso concurrent new mucositis with EGD and pouchoscopy without active bleeding but an anal fissure and a nonbleeding anastomotic ulcer. Primary and GI team have discussed further workup with family and patient and we will defer all decision-making regarding further GI bleed work up to primary and GI team as this is not our area of expertise. However, I have communicated that it is impossible for [name] to estimate the role, if any, desmoid tumors may be playing a role in her acute anemia.    Patient potentially discharging as early as 11/15.    Recommendations:   -We will plan to follow only as needed. Please reach out with any acute concerns or questions regarding sorafenib or to help facilitate outpatient follow up at discharge.    This patient has been discussed with Dr. Randolph Bing. These recommendations were discussed with the primary team.      Please contact the oncology consult fellow at [phone] with any further questions.     Philippa Sicks, MD PhD  Oncology Fellow

## 2022-01-22 NOTE — Unmapped (Signed)
Department of Anesthesiology  Pain Medicine Division    Chronic Pain Followup Inpatient Consult Note    Requesting Attending Physician:  Edd Fabian, DO  Service Requesting Consult:  [facility])    Assessment/Recommendations:  The patient was seen in consultation on request of Arman Filter, MD regarding assistance with pain management. The patient is not obtaining adequate pain relief on current medication regimen.      The patient is a 21 y.o. 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 She was also started on TPN this admission per prior outpatient plan.       Chronic pain team was consulted as patient is having acute on chronic pain.  Patient recently received bilateral celiac plexus blocks on November 1 as an outpatient.  Patient reported having right-sided flank and right lower quadrant pain that was thought to be muscle spasms.  Patient was sent to the ER by [name] for possible evaluation of a perforation.  Imaging in the ED was negative for any findings.  In the past patient has had a history of having significant pain flares after trigger point injections that continues for 1 day.  She has used Toradol before which does help with pain relief.       We discussed completing the IV ketamine infusion and weaning off the IV dilaudid during this hospitalization.    Interval: Pain is better controlled on PRN oxycodone with breakthrough IV dilaudid. Patient was still feeling agitated and restless yesterday, but atarax improved symptoms and she felt better after visiting the lobby and outside in a wheelchair. She was able to get some sleep overnight and is in better spirits this morning. Patient and primary team in agreement to remove the IV dilaudid today and continue with PRN oxycodone and PO dilaudid at night. This is her home regimen. Formal recommendations are below.       Recommendations:  -The chronic pain service is a consult service and does not place orders, just makes recommendations (except ketamine and lidocaine infusions)   -Please evaluate all patients on opioids for appropriateness of prescribing narcan at discharge.  The chronic pain service can assist with this.  Nasal narcan is covered by most insurances.  -Recommendations given apply to the current hospitalization and do not reflect long term recommendations.    Changes to current analgesic regimen:  -Recommend discontinuing IV dilaudid  -Continue oxycodone 10mg  q4 PRN and PO dilaudid 3mg  PRN nightly  -Consider addition of simethicone to assist with possible gas pain/abdominal cramping from GI prep solution    Continue without changes:  -Continue acetaminophen 1000 mg every 8 hours scheduled  -Continue Voltaren 1% gel up to 4 times per day as needed for arthritic and muscular pain  -Continue baclofen 10 mg oral tablet every 8 hours scheduled  -Continue gabapentin 200 oral capsule every night scheduled  -Continue lidocaine 4% patch for 12 hours a day on areas of pain      We will continue to follow.    Naloxone Rx at discharge?  Is patient on opioids? Yes.  1)Is dose >50MME?  Yes.  2) Is patient prescribed a benzodiazepine (w opioids)? No.  3)Hx of overdose?  No.  4) Hx of substance use disorder? No.  5) Opioids likely to last greater than a week after discharge? Yes.  If yes to 2 or more, prescribe naloxone at discharge.  Nasal narcan for most insured (Nasal narcan 4mg /actuation, prescribe 1 kit, instructions at [email or web address]).  For uninsured, chronic pain can work to assist in finding an option.  OTC nasal narcan now available at most pharmacies for around $45.    Interim History  There were no Acute Events Overnight.  Ms. [name] stated that she was feeling a little bit better this morning. Was able to get some sleep overnight. Had a few BMs yesterday. Felt a little anxious and restless yesterday but was able to spend some time in the lobby and outside which helped. Pain continues to be in lower epigastric region.    Analgesia Evaluation:  Pain at minimum:  0/10  Pain at maximum: 8/10    Current pain medication regimen (including how frequent PRN's were used):  -Gabapentin 200 mg at night  -Baclofen 10 mg 3 times daily  -Dilaudid 3 mg PO nightly  -Oxycodone 10mg  q4 PRN (x2)  -Tylenol 1000mg  q8  -Diclofenac gel as needed      Inpatient Medications  Current Facility-Administered Medications   Medication Dose Route Frequency Provider Last Rate Last Admin    acetaminophen (TYLENOL) tablet 1,000 mg  1,000 mg Oral Q8H Janjua, Ejaz Arshad, DO   1,000 mg at 01/22/22 0538    ammonium lactate (LAC-HYDRIN) 12 % lotion 1 application.  1 application. Topical BID Edd Fabian, DO   1 application. at 01/21/22 0916    apixaban (ELIQUIS) tablet 2.5 mg  2.5 mg Oral BID Edd Fabian, DO   2.5 mg at 01/21/22 2205    baclofen (LIORESAL) tablet 10 mg  10 mg Oral Q8H Edd Fabian, DO   10 mg at 01/22/22 9604    cefdinir (OMNICEF) capsule 300 mg  300 mg Oral BID Edd Fabian, DO   300 mg at 01/21/22 2206    diclofenac sodium (VOLTAREN) 1 % gel 2 g  2 g Topical QID PRN Edd Fabian, DO        diphenhydrAMINE (BENADRYL) capsule/tablet 50 mg  50 mg Oral Q6H PRN Edd Fabian, DO   50 mg at 01/18/22 2134    famotidine (PEPCID) tablet 20 mg  20 mg Oral Nightly Edd Fabian, DO   20 mg at 01/21/22 2204    Parenteral Nutrition (CENTRAL)   Intravenous Continuous Edd Fabian, DO 80 mL/hr at 01/21/22 2359 New Bag at 01/21/22 2359    And    fat emulsion 20 % with fish oil (SMOFLIPID) infusion 250 mL  250 mL Intravenous Continuous Edd Fabian, DO 20.8 mL/hr at 01/21/22 2359 250 mL at 01/21/22 2359    gabapentin (NEURONTIN) capsule 200 mg  200 mg Oral Nightly Edd Fabian, DO   200 mg at 01/21/22 2205    hydrocortisone (ANUSOL-HC) suppository 25 mg  25 mg Rectal BID PRN Edd Fabian, DO        hydrocortisone 1 % cream   Topical BID PRN Edd Fabian, DO   Given at 01/19/22 1003    HYDROmorphone (DILAUDID) tablet 3 mg  3 mg Oral Nightly PRN Edd Fabian, DO        HYDROmorphone (PF) (DILAUDID) injection 0.5 mg  0.5 mg Intravenous Q4H PRN Edd Fabian, DO   0.5 mg at 01/22/22 0537    hydrOXYzine (ATARAX) tablet 50 mg  50 mg Oral Q6H PRN Edd Fabian, DO   50 mg at 01/21/22  2007    hyoscyamine (LEVSIN) tablet 0.125 mg  0.125 mg Oral Q4H PRN Edd Fabian, DO        lactated Ringers infusion  10 mL/hr Intravenous Continuous Edd Fabian, DO 200 mL/hr at 01/20/22 0916 Restarted at 01/20/22 0942    lidocaine 4 % patch 2 patch  2 patch Transdermal Daily Edd Fabian, DO   2 patch at 01/21/22 0160    magic mouthwash oral suspension (diphenhydrAMINE, nystatin) 10 mL  10 mL Oral QID PRN Edd Fabian, DO   10 mL at 01/19/22 2208    melatonin tablet 3 mg  3 mg Oral Nightly PRN Edd Fabian, DO        mirtazapine (REMERON) tablet 7.5 mg  7.5 mg Oral Nightly Edd Fabian, DO   7.5 mg at 01/21/22 2204    naloxone [facility]) injection 0.1 mg  0.1 mg Intravenous Q5 Min PRN Edd Fabian, DO        NIFEdipine-lidocaine 0.3%-1.5% in petrolatum ointment   Topical BID Edd Fabian, DO   Given at 01/21/22 2211    OLANZapine zydis (ZyPREXA) disintegrating tablet 5 mg  5 mg Oral Nightly Edd Fabian, DO   5 mg at 01/21/22 2205    ondansetron (ZOFRAN-ODT) disintegrating tablet 4 mg  4 mg Oral Q6H PRN Edd Fabian, DO        Or    ondansetron (ZOFRAN) injection 4 mg  4 mg Intravenous Q6H PRN Edd Fabian, DO   4 mg at 01/21/22 1736    oxyCODONE (ROXICODONE) immediate release tablet 10 mg  10 mg Oral Q4H PRN Edd Fabian, DO   10 mg at 01/21/22 2006    senna (SENOKOT) tablet 2 tablet  2 tablet Oral Nightly PRN Edd Fabian, DO        simethicone (MYLICON) chewable tablet 160 mg  160 mg Oral QID PRN Edd Fabian, DO   160 mg at 01/19/22 2041    sodium chloride (NS) 0.9 % infusion  10 mL/hr Intravenous Continuous Edd Fabian, DO 10 mL/hr at 01/13/22 1342 10 mL/hr at 01/13/22 1342    SORAfenib (NexAVAR) tablet 200 mg **patient supplied**  200 mg Oral Daily Edd Fabian, DO   200 mg at 01/21/22 0921    tacrolimus (PROTOPIC) 0.1 % ointment 1 application.  1 application. Topical BID PRN Edd Fabian, DO        triamcinolone (KENALOG) 0.1 % ointment   Topical BID PRN Edd Fabian, DO   Given at 01/19/22 1003         Objective:     Vital Signs    Temp:  [35.8 ??C (96.5 ??F)-36 ??C (96.8 ??F)] 36 ??C (96.8 ??F)  Heart Rate:  [83-117] 83  Resp:  [18] 18  BP: (109-116)/(53-63) 109/53  MAP (mmHg):  [77-82] 77  SpO2:  [97 %-98 %] 98 %      Physical Exam    GENERAL:  Well developed, well-nourished female and is in no apparent distress.  HEAD/NECK:    Reveals normocephalic/atraumatic.   CARDIOVASCULAR:   Warm, well perfused  LUNGS:   Normal work of breathing  EXTREMITIES:  Warm, no clubbing, cyanosis, or edema was noted.   ABDOMEN:  tender to moderate palpation in epigastric region  NEUROLOGIC:    The patient was alert and oriented times four with normal language, attention, cognition and memory. Cranial nerve exam was grossly normal.  Sensation Intact  to light touch throughout the bilateral upper and lower extremities.  MUSCULOSKELETAL:    Motor function  5/5 in upper and lower extremities.    SKIN:  No obvious rashes lesions or erythema  PSY:  Appropriate affect and mood.    Test Results    Lab Results   Component Value Date    CREATININE 0.36 (L) 01/21/2022     Lab Results   Component Value Date    ALKPHOS 39 (L) 01/18/2022    BILITOT 0.3 01/18/2022    BILIDIR 0.20 12/04/2021    PROT 5.1 (L) 01/18/2022    ALBUMIN 2.9 (L) 01/18/2022    ALT 18 01/18/2022    AST 20 01/18/2022           Problem List    Principal Problem:    Intractable abdominal pain  Active Problems:    [name]    [name]    Dehydration    Severe protein-calorie malnutrition (CMS-HCC)

## 2022-01-23 DIAGNOSIS — R1084 Generalized abdominal pain: Secondary | ICD-10-CM | POA: Diagnosis not present

## 2022-01-23 DIAGNOSIS — E860 Dehydration: Secondary | ICD-10-CM | POA: Diagnosis not present

## 2022-01-23 DIAGNOSIS — Z932 Ileostomy status: Secondary | ICD-10-CM | POA: Diagnosis not present

## 2022-01-23 DIAGNOSIS — R112 Nausea with vomiting, unspecified: Secondary | ICD-10-CM | POA: Diagnosis not present

## 2022-01-23 MED FILL — SORAFENIB 200 MG TABLET: ORAL | 15 days supply | Qty: 15 | Fill #1

## 2022-01-23 NOTE — Unmapped (Signed)
AVS reviewed with the patient and family. All questions answered. PIVs removed.    Problem: Adult Inpatient Plan of Care  Goal: Plan of Care Review  01/22/2022 1631 by Carver Fila, RN  Outcome: Resolved  01/22/2022 1604 by Carver Fila, RN  Outcome: Progressing  Goal: Patient-Specific Goal (Individualized)  01/22/2022 1631 by Carver Fila, RN  Outcome: Resolved  01/22/2022 1604 by Carver Fila, RN  Outcome: Progressing  Goal: Absence of Hospital-Acquired Illness or Injury  01/22/2022 1631 by Carver Fila, RN  Outcome: Resolved  01/22/2022 1604 by Carver Fila, RN  Outcome: Progressing  Intervention: Identify and Manage Fall Risk  Recent Flowsheet Documentation  Taken 01/22/2022 0919 by Carver Fila, RN  Safety Interventions:   fall reduction program maintained   low bed   lighting adjusted for tasks/safety   nonskid shoes/slippers when out of bed  Intervention: Prevent Skin Injury  Recent Flowsheet Documentation  Taken 01/22/2022 0919 by Carver Fila, RN  Positioning for Skin: Supine/Back  Skin Protection: adhesive use limited  Goal: Optimal Comfort and Wellbeing  01/22/2022 1631 by Carver Fila, RN  Outcome: Resolved  01/22/2022 1604 by Carver Fila, RN  Outcome: Progressing  Goal: Readiness for Transition of Care  01/22/2022 1631 by Carver Fila, RN  Outcome: Resolved  01/22/2022 1604 by Carver Fila, RN  Outcome: Progressing  Goal: Rounds/Family Conference  01/22/2022 1631 by Carver Fila, RN  Outcome: Resolved  01/22/2022 1604 by Carver Fila, RN  Outcome: Progressing     Problem: Fall Injury Risk  Goal: Absence of Fall and Fall-Related Injury  01/22/2022 1631 by Carver Fila, RN  Outcome: Resolved  01/22/2022 1604 by Carver Fila, RN  Outcome: Progressing  Intervention: Promote Injury-Free Environment  Recent Flowsheet Documentation  Taken 01/22/2022 0919 by Carver Fila, RN  Safety Interventions:   fall reduction program maintained   low bed   lighting adjusted for tasks/safety   nonskid shoes/slippers when out of bed     Problem: Malnutrition  Goal: Improved Nutritional Intake  01/22/2022 1631 by Carver Fila, RN  Outcome: Resolved  01/22/2022 1604 by Carver Fila, RN  Outcome: Progressing     Problem: Self-Care Deficit  Goal: Improved Ability to Complete Activities of Daily Living  01/22/2022 1631 by Carver Fila, RN  Outcome: Resolved  01/22/2022 1604 by Carver Fila, RN  Outcome: Progressing     Problem: Latex Allergy  Goal: Absence of Allergy Symptoms  01/22/2022 1631 by Carver Fila, RN  Outcome: Resolved  01/22/2022 1604 by Carver Fila, RN  Outcome: Progressing

## 2022-01-24 DIAGNOSIS — R1084 Generalized abdominal pain: Secondary | ICD-10-CM | POA: Diagnosis not present

## 2022-01-24 DIAGNOSIS — E860 Dehydration: Secondary | ICD-10-CM | POA: Diagnosis not present

## 2022-01-24 DIAGNOSIS — Z932 Ileostomy status: Secondary | ICD-10-CM | POA: Diagnosis not present

## 2022-01-24 DIAGNOSIS — R112 Nausea with vomiting, unspecified: Secondary | ICD-10-CM | POA: Diagnosis not present

## 2022-01-24 NOTE — Unmapped (Signed)
Physician Discharge Summary    Admit date: 01/10/2022    Discharge date and time: 01/22/2022    Discharge to: Home    Discharge Service: Med Bernita Raisin [facility])    Discharge Attending Physician: No att. providers found    Discharge Diagnoses: Anal fissure, [name], desmoid tumors.     Procedures: EGD    Pertinent Test Results: EGD with anal fissure and one solitary ulcer at anastomosis.     Hospital Course:      [name] [name] is a 21 y.o. 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 She was also started on TPN this admission per prior outpatient plan. Hospital course complicated by GI bleed.  Following problems were addressed during the hospital stay.     Near-syncope:  Had one episode of near-syncope which appeared to be vasovagal given pain and nausea associated with the episode.  POTS can't be excluded given HR jumped up to 134 from 110 upon standing; however, this is difficult to diagnose when she states she is in this amount of pain.  When on a long-acting agent outpatient and pain is better-controlled, can reassess for POTS at that time and see if midodrine or fludrocortisone could help in the outpatient setting.       Acute on chronic blood loss anemia  -secondary to possibly a solitary ulcer at the anastomotic site.  No evidence of pouchitis.  Avoided further dose of NSAIDs - this is contraindicated not only for the ulcer but given chronic, recurrent pouchitis.  No transfusions required this hospitalization.  Discussed in detail with GI fellow.  Onc does not feel strongly about desmoid tumor bleeding thus will not pursue a patency study at this time.  GI also stated that if the patency study passes it does not guarantee that the VCE will pass.  She has moderate to severe extrinsic compression/stenosis at the pre-pouch ileum.  Unclear etiology at this time.  No evidence of an obstruction nor a desmoid tumor at that site.  If abdominal pain significantly worsens, will re-image to assess for an obstruction and patient will return to the ER.  GI  assured her that anal fissures caused the most recent episode of minimal bleeding in her stool.  Was discharged on topical nifedipine-lidocaine.      Hx line associated DVT.   After a prolonged discussion with the patient and mother, lovenox 40 mg for one dose was given.  When she tolerated this, switched to apixaban and did not have hemodynamically unstable bleeding.  Patient reassured that it was safe to continue [facility] given benefits outweighing risks and GI strongly feeling bleeding was due to anal fissures. .  Will monitor for bleeding today.  Per Heme recs, needs to be on this while she has a long-term CVC.         Acute on chronic abdominal pain  Abdominal desmoid tumors  Patient underwent celiac plexus block on 01/08/2022 with [name] as an outpatient and presented with acute right flank and RLQ pain. Per outpatient notes, [name] feels her acute pain response is most consistent with muscle spasms and referred pain from the spasms. CT showed small locule of gas in the left perirenal tract, likely post-procedural from block, but no renal or psoas injury or other acute findings. Patient continued to have subjective severe pain requiring IV hydromorphone q3 hrs.  Chronic Pain consulted and provided ketamine drip after careful risk/benefit discussion w/ pt.  She was weaned off ketamine.  New pain regimen was prn oxycodone 5 mg q4 during the day and prn 3 mg po dilaudid at night.      - Chronic Pain consulted, appreciate recs, weaned off ketamine. Continuously asked for IV dilaudid.  Following pain management recs of prn oxycodone during the day and prn po dilaudid at night.  More IV dilaudid won't be the solution.  Needs long-acting agent which can be initiated with her pain management follow-up outpatient.   Oncology follow-up scheduled outpatient too.  Being in the hospital will only make her feel worse.  IV narcotics are not the solution to her issues.  Had two episodes of bright red blood and they look less bloody than prior to her EGD.  Suspect her fissure is causing this and she is on topical nifedipine-lidocaine per GI recs.  Discussed with GI who spoke with the patient and provided reassurance.       Severe malnutrition in setting of [name] and abdominal desmoid tumors  Patient previously on TPN twice before, administered through PICC lines which then were ultimately removed and central lines placed (at least once due to clot). She was recently discharged with a corpak and plan for tube feeds at home, but continued to lose weight and have difficulty with pain control and nausea. She was seen recently by Dr. [name] and her outpatient dietitian with plans to resume TPN and had waiting for a direct admission to do. Tunneled line placed 11/3 and she was started on TPN on 11/3. NG tube removed 11/4 due to no plans to continue tube feeds. Home TPN was arrnaged with the help of CM and nutritionist.       [name]/Desmoid tumors. 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 Mucositis mixture and ammonium for hydration and symptomatic relief were provided to the patient.      Anxiety - was on atarax.              Condition at Discharge: stable  Discharge Medications:      Your Medication List        STOP taking these medications      clobetasoL 0.05 % shampoo  Commonly known as: CLOBEX     HYDROmorphone 2 MG tablet  Commonly known as: DILAUDID            START taking these medications      apixaban 2.5 mg Tab  Commonly known as: ELIQUIS  Take 1 tablet (2.5 mg total) by mouth two (2) times a day.     diphenhydrAMINE 50 mg capsule  Commonly known as: BENADRYL  Take 1 capsule (50 mg total) by mouth every six (6) hours as needed for itching (per patient preference (if prefers over hydroxyzine)).     hydrocortisone 25 mg suppository  Commonly known as: ANUSOL-HC  Insert 1 suppository (25 mg total) into the rectum two (2) times a day as needed for hemorrhoids (possible rectal/anal bleeding source).     NIFEdipine 0.3% lidocaine 1.5% in petrolatum ointment  Apply topically two (2) times a day.            CHANGE how you take these medications      gabapentin 100 MG capsule  Commonly known as: NEURONTIN  Take 2 capsules (200 mg total) by mouth nightly.  What changed:   how much to take  how to take this  when to take this  additional instructions     hydrOXYzine 50 MG tablet  Commonly known as: ATARAX  Take 1 tablet (50 mg total) by mouth every six (6) hours as needed for itching or anxiety (also insomnia).  What changed:   medication strength  how much to take            CONTINUE taking these medications      acetaminophen 500 MG tablet  Commonly known as: TYLENOL  Take 2 tablets (1,000 mg total) by mouth two (2) times a day.     baclofen 5 mg Tab tablet  Commonly known as: LIORESAL  Take 1-2 tablets (5-10 mg total) by mouth two (2) times a day as needed for muscle spasms.     cefdinir 300 MG capsule  Commonly known as: OMNICEF  Take 1 capsule (300 mg total) by mouth Two (2) times a day.     COURIERED MED OR SUPPLY  [id number], SORAFENIB 200MG  TABLET     diclofenac sodium 1 % gel  Commonly known as: VOLTAREN  Apply 2 g topically four (4) times a day.     famotidine 20 MG tablet  Commonly known as: PEPCID  Take 1 tablet (20 mg total) by mouth nightly.     hydrocortisone 2.5 % cream  Apply 1 application. topically daily as needed.     hyoscyamine 0.125 mg tablet  Commonly known as: LEVSIN  Take 1 tablet (0.125 mg total) by mouth every four (4) hours as needed for cramping.     lidocaine 5 % patch  Commonly known as: LIDODERM  Place 1 patch on the skin daily. Apply to affected area for 12 hours only each day (then remove patch)     menthol-zinc oxide 0.44-20.6 % Oint  Commonly known as: CALMOSEPTINE  Apply topically four (4) times a day as needed.     mirtazapine 7.5 MG tablet  Commonly known as: REMERON  Take 1 tablet (7.5 mg total) by mouth nightly.     naloxone 4 mg/actuation nasal spray  Commonly known as: NARCAN  One spray in either nostril once for known/suspected opioid overdose. May repeat every 2-3 minutes in alternating nostril til EMS arrives     OLANZapine zydis 5 MG disintegrating tablet  Commonly known as: ZyPREXA  Take 1 tablet (5 mg total) by mouth nightly.     ondansetron 8 MG disintegrating tablet  Commonly known as: ZOFRAN-ODT  Take 1 tablet (8 mg total) by mouth every twelve (12) hours as needed for nausea.     oxyCODONE 10 mg immediate release tablet  Commonly known as: ROXICODONE  Take 1 tablet (10 mg total) by mouth every four (4) hours as needed for pain. Fill on or after: 01/01/22     pimecrolimus 1 % cream  Commonly known as: ELIDEL  Apply topically two (2) times a day. To face as needed for rash     silver sulfaDIAZINE 1 % cream  Commonly known as: SILVADENE  Apply topically daily. To burn on leg     SORAfenib 200 mg tablet  Commonly known as: NexAVAR  Take 1 tablet (200 mg total) by mouth daily.     triamcinolone 0.1 % cream  Commonly known as: KENALOG  Apply topically two (2) times a day.     zinc oxide 10 % Crea  Apply 1 application. topically daily as needed.  Pending Test Results:       Discharge Instructions:     Follow Up instructions and Outpatient Referrals     Referral to Home Infusion      Performing location?: External    Home Health Requested Disciplines: Nursing     **Please contact your service pharmacist for assistance with discharge   home health infusion monitoring.      Referral to Home Infusion      Performing location?: External    Home Health Requested Disciplines: Nursing     **Please contact your service pharmacist for assistance with discharge   home health infusion monitoring.        Appointments which have been scheduled for you      Jan 28, 2022  9:30 AM  (Arrive by 9:00 AM)  LAB ONLY [place] with ADULT ONC LAB  [facility] ADULT ONCOLOGY LAB DRAW STATION [place] [facility] REGION) [address]  [place] [address]  [phone]        Jan 28, 2022 10:30 AM  (Arrive by 10:00 AM)  RETURN ACTIVE [place] with Sheral Apley, MD  [place] [facility]COL[facility] MULTIDISCIPLINARY 2ND FLR CANCER HOSP [facility] REGION) [address]  [place] [address]  [phone]        Jan 29, 2022 10:40 AM  (Arrive by 10:10 AM)  RETURN  NON PROCEDURAL with Nat Christen., MD  [facility] PAIN MANAGEMENT CENTER [address] [facility] [place] [facility] REGION) [address]  [address]  [place] [address]  [phone]        Feb 05, 2022 11:00 AM  (Arrive by 10:45 AM)  RETURN PEDS with Gala Romney, MD  [facility] DERMATOLOGY AND SKIN CANCER CENTER [place] [facility] REGION) [address]  [place] [address]  [phone]        Feb 06, 2022  2:30 PM  (Arrive by 2:00 PM)  RETURN VIDEO MYCHART with [name], MD  [facility] CCSP 2ND FLR CANCER HOSP [place] [facility] REGION) [address]  [place] [address]  [phone]   Please sign into My [place] Chart at least 15 minutes before your appointment to complete the eCheck-In process. You must complete eCheck-In before you can start your video visit. We also recommend testing your audio and video connection to troubleshoot any issues before your visit begins. Click ???Join Video Visit??? to complete these checks. Once you have completed eCheck-In and tested your audio and video, click ???Join Call??? to connect to your visit.     For your video visit, you will need a computer with a working camera, speaker and microphone, a smartphone, or a tablet with internet access.    My [place] Chart enables you to manage your health, send non-urgent messages to your provider, view your test results, schedule and manage appointments, and request prescription refills securely and conveniently from your computer or mobile device.    You can go to [email or web address] to sign in to your My [place] Chart account with your username and password. If you have forgotten your username or password, please choose the ???Forgot Username???? and/or ???Forgot Password???? links to gain access. You also can access your My [place] Chart account with the free MyChart mobile app for Android or iPhone.    If you need assistance accessing your My [place] Chart account or for assistance in reaching your provider's office to reschedule or cancel your appointment,  please call [facility] [phone].         Feb 06, 2022  2:30 PM  (Arrive by 2:15 PM)  RETURN IBD with Modena Nunnery, MD  [facility] GI MEDICINE [place] [facility] REGION) [address]  [facility] 1 through 4  [place] [address]  [phone]        Apr 03, 2022  9:30 AM  (Arrive by 9:15 AM)  RETURN PEDS with Gala Romney, MD  [facility] DERMATOLOGY AND SKIN CANCER CENTER [place] [facility] REGION) [address]  [place] [address]  [phone]        Jul 14, 2022 10:45 AM  (Arrive by 10:30 AM)  RETURN HEM BENIGN with Cassiopeia Anne Jorge Ny, [place]  [facility] BENIGN HEMATOLOGY CLINIC [place] [facility] REGION) [address]  [facility] 1 through 4  [place] [address]  [phone]                I spent greater than 30 minutes in the discharge of this patient.

## 2022-01-25 DIAGNOSIS — F411 Generalized anxiety disorder: Principal | ICD-10-CM

## 2022-01-25 DIAGNOSIS — R1084 Generalized abdominal pain: Secondary | ICD-10-CM | POA: Diagnosis not present

## 2022-01-25 DIAGNOSIS — R112 Nausea with vomiting, unspecified: Secondary | ICD-10-CM | POA: Diagnosis not present

## 2022-01-25 DIAGNOSIS — R000 Tachycardia, unspecified: Secondary | ICD-10-CM | POA: Diagnosis not present

## 2022-01-25 DIAGNOSIS — Z932 Ileostomy status: Secondary | ICD-10-CM | POA: Diagnosis not present

## 2022-01-25 DIAGNOSIS — E860 Dehydration: Secondary | ICD-10-CM | POA: Diagnosis not present

## 2022-01-25 DIAGNOSIS — F418 Other specified anxiety disorders: Secondary | ICD-10-CM | POA: Diagnosis not present

## 2022-01-26 DIAGNOSIS — Z932 Ileostomy status: Secondary | ICD-10-CM | POA: Diagnosis not present

## 2022-01-26 DIAGNOSIS — R1084 Generalized abdominal pain: Secondary | ICD-10-CM | POA: Diagnosis not present

## 2022-01-26 DIAGNOSIS — E860 Dehydration: Secondary | ICD-10-CM | POA: Diagnosis not present

## 2022-01-26 DIAGNOSIS — R112 Nausea with vomiting, unspecified: Secondary | ICD-10-CM | POA: Diagnosis not present

## 2022-01-26 NOTE — Unmapped (Signed)
S-Patient seen in UC today for anxiety and rapid HR. Labs drawn but was told not bad enough for ED to call her care team and see if they could prescribe something for her anxiety.  B-Has had anxiety for last 4 days but the last 2 days has been unable to sleep or eat.  Patient requested to speak with provider and if they could call something in call to pharmacy below.  Cannot take Cymbalta or amitriptyline interacts with Sorafenib treatment.  A-Severe anxiety, No SI,and does not respond to reassurance.  Go to ED Now (or PCP triage)-Patient refuses disposition and wishes to speak with provider  R-Go to ED(or PCP triage)-patient refuses dispo and wants to speak with provider    Upcoming Appt:  Future Appointments   Date Time Provider Department Center   01/28/2022  9:30 AM ADULT ONC LAB [facility] [place]   01/28/2022 10:30 AM Sheral Apley, MD ONCMULTI [place]   01/29/2022 10:40 AM Nat Christen., MD ANESPAINMRKT [place]   02/05/2022 11:00 AM Gala Romney, MD DERMMRKT [facility]   02/06/2022  2:30 PM Herfarth, Philippa Chester, MD UNCGIMEDET [place]   02/06/2022  2:30 PM [name], MD [id number] [place]   04/03/2022  9:30 AM Gala Romney, MD DERMMRKT [facility]   07/14/2022 10:45 AM Garlan Fillers [place], PA UNCBENHEMET [facility]       Recent:   What is the date of your last related visit? Seen at [facility] today for heart rate and anxiety.  Did labs for Magnesium, CBC.UC told her to call her care team and see if they could prescribe her something for anxiety she did not warrant going to the ED at this time. 01/22/22 discharged from [facility] for abdominal pain, [name], GI bleed, anal fissue  Related acute medications Rx'd:  Eliquis, benadryl,hydrocortisone suppositories Nifedipine ointment  Home treatment tried:  Eliquis, benadryl, Nifedipine      Relevant:   Allergies: Adhesive tape-silicones; Ferrlecit [sodium ferric gluconat-sucrose]; Levofloxacin; Methylnaltrexone; Neomycin; Papaya; Morphine; Zosyn [piperacillin-tazobactam]; Compazine [prochlorperazine]; and Latex, natural rubber  Medications: sorafnib  Health History: Desmoid tumors  Weight: n/a      [place] Cancer patients only:  What was the date of your last cancer treatment (mm/dd/yy)?: 01/25/22  Was the treatment oral or infusion?: oral  Are you currently on TVEC (yes/no)?: n/a    [facility]  [address]  [place], [place]  [address]    Phone:  [phone]  Open 24 hours    Reason for Disposition   [1] SEVERE anxiety (e.g., extremely anxious with intense emotional symptoms such as feeling of unreality, urge to flee, unable to calm down; unable to cope or function) AND [2] not better after 10 minutes of reassurance and Care Advice    Protocols used: Anxiety and Panic Attack-A-AH

## 2022-01-26 NOTE — Unmapped (Signed)
Dr Gerrit Friends with psychiatry asked for a return call. CN spoke with Dr Gerrit Friends who stated he was paged inappropriately with a pt phone number, and pt should call [phone] and her concern would be handled during business hours. CN called patient to give her psych number.

## 2022-01-26 NOTE — Unmapped (Addendum)
Received the following page: E1:PTLuella Cook [id number]    After looking up the patient name and DOB in the EMR, I saw that Alphonsa Overall, RN documented directing the patient to on call psychiatry. I epic Arlyn Dunning to call my cell phone as I have never been paged directly to a patient number previously without some information being shared directly or as a faculty on-call. Corrie Dandy indicated that the patient was intent to speak with a psychiatrist connected to her care team, but that she declined ED evaluation and noted it was not an emergency. Corrie Dandy noted the patient refused to send a Mychart message to Dr. Malcolm Metro, a psychiatry resident (fellow) who recently evaluated the patient in clinic 10/26. Corrie Dandy noted that she called the operator and that the operator must have paged me. Corrie Dandy indicated concern that the patient requested a call back, offering to call her back herself,and  asking if I intended to. I explained that she should be directed to call the [facility] clinic as she was directed at her clinic visit. The following is what is noted in the patient's recent clinic visit instructions: For after hours urgent issues, you may call [phone] or call I need to talk line at [phone] anytime 24/7. The patient will be responded to by an after hours calling service who will involve on-call clinicians as indicated.    This message was copied to Dr. Malcolm Metro and Dr. Charlann Noss.    Dory Peru, MD

## 2022-01-26 NOTE — Unmapped (Signed)
Copied from CRM #[id number]. Topic: HL - Nurse Triage - [facility] Contract Page  >> Jan 25, 2022  7:51 PM [name] wrote:  CN spoke with Dr Jacky Kindle when on the call for another patient. Patient requested hem/onc care provider to help. MD recommended ED evaluation. Pt refused ED. CN paged psychiatry through hosp operator for assistance tonight 11/18, pt refused to leave my chart message for provider who offered help to her late October.

## 2022-01-26 NOTE — Unmapped (Signed)
Contacted patient at 9:22 PM after receiving a notification from protocol. Patient reports that she has been having persistent anxiety over the interval with accompanying physical symptoms including an elevated heart rate, nausea, inability to sleep and racing thoughts. She reports that she went to urgent care and was advised to contact the protocol line. She also reports that she has stopped taking hydroxyzine PRN because she believes that it is interacting with her cancer medication and causing tachycardia. Consideration was given to benzodiazepine therapy; however given the patient's use of opioid for chronic cancer pain there is a significant risk for respiratory depression. Per the patient's recent outpatient notes it was recommended that she increase her olanzapine to 5 mg daily ;however, she only has only been taking one 2.5 mg tablet every night. Advised the patient to take 5 mg this weekend and contact her primary psychiatrist on Monday. Instructed the patient to collect her 5 mg prescription of Zyprexa Zydis as it has not been picked up from her local pharmacy.       Plan:     Problem 1: Generalized Anxiety Disorder  Status of problem: worsening  Interventions:   - INCREASE Olanzapine to 5 mg qHS for anxiety, low mood, insomnia, and reduced appetite  - Continue Mirtazapine 7.5 mg qHS; (of note, with dose increase to 15 mg it didn't give much benefit for how sleepy it made her the next day)  - Continue Hydroxyzine 12.5-25 mg PRN for acute anxiety ( patient has discontinued due to concern about interactions with her cancer regiment.       Problem 2: Major Depressive Disorder  Status of problem: new problem to this provider  Interventions:   - Olanzapine as above  - Mirtazapine as above  - Psychotherapy as above    Lendell Caprice, MD   PGY 2 [facility] Psychiatry

## 2022-01-26 NOTE — Unmapped (Signed)
Contacted patient at 9:22 PM after receiving a notification from protocol. Patient reports that she has been having persistent anxiety over the interval with accompanying physical symptoms including an elevated heart rate, nausea, inability to sleep and racing thoughts. She reports that she went to urgent care and was advised to contact the protocol line. She also reports that she has stopped taking hydroxyzine PRN because she believes that it is interacting with her cancer medication and causing tachycardia. Consideration was given to benzodiazepine therapy; however given the patient's use of opioid for chronic cancer pain there is a significant risk for respiratory depression. Per the patient's recent outpatient notes it was recommended that she increase her olanzapine to 5 mg daily ;however, she only has only been taking one 2.5 mg tablet every night. Advised the patient to take 5 mg this weekend and contact her primary psychiatrist on Monday. Instructed the patient to collect her 5 mg prescription of Zyprexa Zydis as it has not been picked up from her local pharmacy.       Plan:     Problem 1: Generalized Anxiety Disorder  Status of problem: worsening  Interventions:   - INCREASE Olanzapine to 5 mg qHS for anxiety, low mood, insomnia, and reduced appetite  - Continue Mirtazapine 7.5 mg qHS; (of note, with dose increase to 15 mg it didn't give much benefit for how sleepy it made her the next day)  - Continue Hydroxyzine 12.5-25 mg PRN for acute anxiety ( patient has discontinued due to concern about interactions with her cancer regiment.       Problem 2: Major Depressive Disorder  Status of problem: new problem to this provider  Interventions:   - Olanzapine as above  - Mirtazapine as above  - Psychotherapy as above    Sherryl Barters, MD   PGY 2 [facility] Psychiatry

## 2022-01-27 DIAGNOSIS — F411 Generalized anxiety disorder: Principal | ICD-10-CM

## 2022-01-27 DIAGNOSIS — R000 Tachycardia, unspecified: Secondary | ICD-10-CM | POA: Diagnosis not present

## 2022-01-27 DIAGNOSIS — R1084 Generalized abdominal pain: Secondary | ICD-10-CM | POA: Diagnosis not present

## 2022-01-27 DIAGNOSIS — R112 Nausea with vomiting, unspecified: Secondary | ICD-10-CM | POA: Diagnosis not present

## 2022-01-27 DIAGNOSIS — Z932 Ileostomy status: Secondary | ICD-10-CM | POA: Diagnosis not present

## 2022-01-27 DIAGNOSIS — E860 Dehydration: Secondary | ICD-10-CM | POA: Diagnosis not present

## 2022-01-27 MED ORDER — OLANZAPINE 7.5 MG TABLET
ORAL_TABLET | Freq: Every evening | ORAL | 2 refills | 30 days | Status: CP
Start: 2022-01-27 — End: 2022-04-27

## 2022-01-27 NOTE — Unmapped (Signed)
Account Number [id number]   Account Name [facility]   Call Id [id number]   Call Start Time 01/25/2022 05:46:12 PM PT  (01/25/2022 08:46:12 PM ET)   Call End Time 01/25/2022 06:02:18 PM PT  (01/25/2022 09:02:18 PM ET)   Call Duration 00:16:05   Total Handle Time 00:16:34   Finalized Time 01/25/2022 07:01:47 PM PT  (01/25/2022 10:01:47 PM ET)   Finalized Call Taker Lequita Asal, Crisis and Access Specialist   Responsible Call Taker Lequita Asal, Crisis and Access Specialist   Level of Care Routine   Call Type Counseling Request,On-call Contact     (Person Information)  Caller Salayah Amore   Caller Alternate Name    Caller Primary Phone Mobile [phone]  Is it ok to leave a message : Yes   Is Caller the POC Yes   Person of Concern Dorotea Iden   Person of Concern Alternate Name    Person of Concern Primary Phone Mobile [phone]  Is it ok to leave a message : Yes     New Call  Insurance coverage? Private Insurance   -[facility] [place]   -[place] [employer]    Is the [employer] or Person of Concern part of the [employer] program? No   Service Request Type For Financial controller Gender Female   -[employer] Address [address]  [place], [place]  [address]  [place]   -Date of birth? Birthdate: Sep 14, 2000  Age: 21  Comment:    Currently enrolled in services with [facility]? Yes   Currently enrolled in services?    -Current therapist or case manager? Yes. Provider name is (below):   -Current therapist or case manager? [name] and Dr. Malcolm Metro     Clinical Assessment 2.2  Primary Reason for Call Medication   Assessed Initial Level of Distress Moderate   Overview [name] is requesting to have a new medication prescribed for anxiety as she has been highly anxious and unable to calm herself   Assessed Presentation Agitated  Anxious   Suicide Risk Assessment Assessment indicates no risk related to suicide   Intentional Self-Injury Risk Assessment Assessment indicates no risk related to self-injury   Threat of Violence Risk Assessment Assessment indicates no risk related to harm to others   Substance Use Risk Assessment Assessment indicates no risk related to substance use   Interpersonal Violence Risk Assessment Assessment indicates no risk related to interpersonal violence   Abuse-related Risk Assessment Not Relevant   Psychiatric Medication Assessment Confirms taking listed medication(s) as prescribed *   Narrative Psychiatric Medication Assessment PSYCHIATRIC MEDICATION NAME(S): Olanzapine 5 mg; Mirtazapine 7.5 mg; Hydroxyzine 50 mg DESCRIBE CONCERNS/RISK, IF ANY: Stopped taking Hydroxyzine due to it interacting negatively with her chemo agent which was advised by her medical physician.    Interventions Provided empathic listening and validation.  Advised seeking medical attention for serious physical symptoms.  Advised calling this line if future risk or urgent concerns arise.  Educated about when Emergency Services are an appropriate option.  Advised that services are available to caller 24/7.  Focused on de-escalating caller.  Focused on problem solving with caller.  Discussed coping skills & strategies.  Created self-care plan with caller.  Helped caller identify friends/family who may be source of support.  Consulted with Social worker.   Additional Intervention Details I consulted with Senior Crisis and Access Specialist Will Carbondale BS, concerning Elbert's need for immediate assistance as her anxiety  is elevated due to a recent medication adjustment that was made concerning her anxiety medication causing negative side effects to her chemo agent and she has been advised to stop the medication but has since been experiencing increased anxiety. It was determined that the oncall should be reached to alert them of this urgent need and to allow them to determine if further assistance is available for Ashyra. At 5:55 PM PST, Spoke with Chauncey with the paging services for the OnCall Admin to reach [name] back here at [facility] on the Consultation line. Received a call back at 6:05 PM from Dr. Allene Dillon. He was able to locate the patient in their system and will be reaching back out to her directly.   Describe clinical justification and rationale    Assessed Concluding Level of Distress Mild   Level of Care (LOC) Routine   Pick one outcome from the drop-down list. If more than one applies, choose the highest level of intervention utilized 8. No higher level intervention needed/available.   Caller's Plan SELF CARE: Continue to monitory physical symptoms related to feelings of anxiousness and panic. RISK MITIGATION: There are currently no risks associated with this call. CONTINGENCY: [name] agreed to call us back and 911 in an emergency if they feel they can not maintain their safety    Follow-up Expectations On-call will follow up with caller     On Call  On-Call Date/Time Phone Number On-Call Type Who Was [facility] Memo   01/25/2022 05:58:57 PM [phone] Answered and Unresolved Chauncey for the [facility] Operator [phone]    01/25/2022 06:06:05 PM [phone] Answered and Resolved Dr. Allene Dillon [phone] Returned my page and was able to provide patient's information. They will be contacting the patient directly.      On-Call Notes Per Donovan Kail, Dr. Allene Dillon is the physician on call.

## 2022-01-27 NOTE — Unmapped (Signed)
I called [name] [name] this morning as she had reached out on-call providers over the weekend due to extreme anxiety.      Over the weekend, with communication with on-call providers she increased her olanzapine dosage to 5mg , which she had forgotten to do previously.  She started this dose on Saturday.      Today, she says she's doing a little bit better compared to over the weekend when she was so anxious she wasn't able to sleep or eat.  She was also able to go to church and do things outside of the house on Sunday which she thinks was therapeutic.  She thinks the medication increase has been helpful, but thinks her anxiety is still fairly severe.      Of note, when I asked about how hydroxyzine had impacted her anxiety, she said she felt ten times worse after taking hydroxyzine.  She mentioned it was on a list she of medications that are contraindicated with Sorafenib.     Given persistent anxiety, I advised her to go ahead and increase olanzapine further to 7.5mg  qHS.  She is in agreement with this plan.  Tonight she will plan to take olanzapine 5mg , and make the increase tomorrow.      When asked about her depressive symptoms, she says that anxiety is the bigger issue for her at present.  She denies any SI since our last appointment.      Assessment:  GAD, worse than prior  Plan:    - Increase Olanzapine to 7.5mg  qHS, to start on Tuesday 11/21.    - Follow up scheduled with myself November 30th, 2023  - Ongoing psychotherapy with [name]    Case was discussed with attending [name].    Kathrine Haddock, MD - Consult Liaison Psychiatry Fellow

## 2022-01-28 ENCOUNTER — Ambulatory Visit: Admit: 2022-01-28 | Discharge: 2022-01-29 | Payer: PRIVATE HEALTH INSURANCE

## 2022-01-28 ENCOUNTER — Other Ambulatory Visit: Admit: 2022-01-28 | Discharge: 2022-01-29 | Payer: PRIVATE HEALTH INSURANCE

## 2022-01-28 DIAGNOSIS — D48119 Desmoid tumor of unspecified site: Secondary | ICD-10-CM | POA: Diagnosis not present

## 2022-01-28 DIAGNOSIS — E860 Dehydration: Principal | ICD-10-CM

## 2022-01-28 DIAGNOSIS — Z932 Ileostomy status: Secondary | ICD-10-CM | POA: Diagnosis not present

## 2022-01-28 DIAGNOSIS — I82A11 Acute embolism and thrombosis of right axillary vein: Secondary | ICD-10-CM | POA: Diagnosis not present

## 2022-01-28 DIAGNOSIS — R1084 Generalized abdominal pain: Secondary | ICD-10-CM | POA: Diagnosis not present

## 2022-01-28 DIAGNOSIS — R112 Nausea with vomiting, unspecified: Secondary | ICD-10-CM | POA: Diagnosis not present

## 2022-01-28 DIAGNOSIS — Q8789 Other specified congenital malformation syndromes, not elsewhere classified: Secondary | ICD-10-CM | POA: Diagnosis not present

## 2022-01-28 LAB — CBC W/ AUTO DIFF
BASOPHILS ABSOLUTE COUNT: 0 10*9/L (ref 0.0–0.1)
BASOPHILS RELATIVE PERCENT: 0.7 %
EOSINOPHILS ABSOLUTE COUNT: 0.1 10*9/L (ref 0.0–0.5)
EOSINOPHILS RELATIVE PERCENT: 1.3 %
HEMATOCRIT: 24.5 % — ABNORMAL LOW (ref 34.0–44.0)
HEMOGLOBIN: 8.1 g/dL — ABNORMAL LOW (ref 11.3–14.9)
LYMPHOCYTES ABSOLUTE COUNT: 1.2 10*9/L (ref 1.1–3.6)
LYMPHOCYTES RELATIVE PERCENT: 23.4 %
MEAN CORPUSCULAR HEMOGLOBIN CONC: 33.1 g/dL (ref 32.0–36.0)
MEAN CORPUSCULAR HEMOGLOBIN: 30.8 pg (ref 25.9–32.4)
MEAN CORPUSCULAR VOLUME: 92.9 fL (ref 77.6–95.7)
MEAN PLATELET VOLUME: 10 fL (ref 6.8–10.7)
MONOCYTES ABSOLUTE COUNT: 0.6 10*9/L (ref 0.3–0.8)
MONOCYTES RELATIVE PERCENT: 12.5 %
NEUTROPHILS ABSOLUTE COUNT: 3.2 10*9/L (ref 1.8–7.8)
NEUTROPHILS RELATIVE PERCENT: 62.1 %
PLATELET COUNT: 181 10*9/L (ref 150–450)
RED BLOOD CELL COUNT: 2.64 10*12/L — ABNORMAL LOW (ref 3.95–5.13)
RED CELL DISTRIBUTION WIDTH: 17.2 % — ABNORMAL HIGH (ref 12.2–15.2)
WBC ADJUSTED: 5.1 10*9/L (ref 3.6–11.2)

## 2022-01-28 LAB — COMPREHENSIVE METABOLIC PANEL
ALBUMIN: 3.4 g/dL (ref 3.4–5.0)
ALKALINE PHOSPHATASE: 41 U/L — ABNORMAL LOW (ref 46–116)
ALT (SGPT): 13 U/L (ref 10–49)
ANION GAP: 6 mmol/L (ref 5–14)
AST (SGOT): 16 U/L (ref <=34)
BILIRUBIN TOTAL: 0.3 mg/dL (ref 0.3–1.2)
BLOOD UREA NITROGEN: 13 mg/dL (ref 9–23)
BUN / CREAT RATIO: 36
CALCIUM: 8.7 mg/dL (ref 8.7–10.4)
CHLORIDE: 110 mmol/L — ABNORMAL HIGH (ref 98–107)
CO2: 26 mmol/L (ref 20.0–31.0)
CREATININE: 0.36 mg/dL — ABNORMAL LOW
EGFR CKD-EPI (2021) FEMALE: >90 mL/min/{1.73_m2} (ref >=60)
GLUCOSE RANDOM: 113 mg/dL (ref 70–179)
POTASSIUM: 4.1 mmol/L (ref 3.4–4.8)
PROTEIN TOTAL: 5.9 g/dL (ref 5.7–8.2)
SODIUM: 142 mmol/L (ref 135–145)

## 2022-01-28 LAB — PHOSPHORUS: PHOSPHORUS: 2.4 mg/dL (ref 2.4–5.1)

## 2022-01-28 LAB — T4, FREE: FREE T4: 1.17 ng/dL (ref 0.89–1.76)

## 2022-01-28 LAB — TSH: THYROID STIMULATING HORMONE: 0.887 u[IU]/mL (ref 0.550–4.780)

## 2022-01-28 LAB — MAGNESIUM: MAGNESIUM: 2.1 mg/dL (ref 1.6–2.6)

## 2022-01-28 MED ORDER — SLEEPY BUTTER
3 refills | 0 days | Status: CP
Start: 2022-01-28 — End: ?

## 2022-01-28 MED ADMIN — heparin, porcine (PF) 100 unit/mL injection 200 Units: 200 [IU] | INTRAVENOUS | @ 20:00:00 | Stop: 2022-01-29

## 2022-01-28 MED ADMIN — sodium chloride (NS) 0.9 % infusion: 1000 mL/h | INTRAVENOUS | @ 18:00:00 | Stop: 2022-01-28

## 2022-01-28 NOTE — Unmapped (Signed)
Adolescent and Young Adult Cancer Program Visit     Service date: January 27, 2022    Encounter location: Virtual: Telephone Call/Message    Clinician: [name], LCSW - AYA Clinical Social Worker    Patient identifiers: [name] [name] is a 21 y.o. with desmoid tumors and [name]. [name] [name] uses she/her pronouns.      Visit Summary     Received message Friday PM from [place] requesting same day visit. Unfortunately I did not have availability; offered visit the next week and offered check in from other psychosocial staff if helpful.    Called Sedra today to follow up. Spoke on the phone for approx 30 min. She shared about worsening anxiety over the past several days, including not being able to sleep due to anxiety for several days over the weekend. However, last night she slept better and felt some improvement in anxiety after spending the whole day out of the house doing different things. We talked about working towards doing as many normal things as possible to help manage and improve anxiety and depressive symptoms.     Follow Up/Plan:     Will see at clinic appointment tomorrow.    I will continue to follow this patient for AYA-appropriate support. They have my contact information and have been encouraged to contact me as needed.     Flowsheet     AYA Assessment  Primary Caregiver: Parent  Location Seen: Inpatient  Contact Point: During treatment  Referral Source: Self-Referred  Issues Discussed: Sleep, Mental Health  AYA Team Interventions: Supportive counseling  Follow Up: As needed  Time Spent (in minutes): 30    01/28/2022     [name], LCSW  Adolescent and Young Adult Clinical Social Worker  Phone: [phone]  Email: [email or web address]

## 2022-01-28 NOTE — Unmapped (Unsigned)
[facility] BONE AND SOFT TISSUE ONCOLOGY RETURN VISIT    Encounter Date: 01/28/2022  Patient Name: [name] [name]  Medical Record Number: [id number]    Referring Physician: Referring, Unknown Per Patient  No address on file    Primary Care Provider: Jarold Motto, [facility]    DIAGNOSIS:  No diagnosis found.      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    Desmoid fibromatosis, history of [name]     She has had numerous sites of disease, including paraspinal, chest wall, right arm/wrist. She has had several cryoablations with Dr. Cathie Hoops which have seemed to offer clinical benefit. She trialed sorafenib which seemed to lead to shrinkage of lesions, but she had significant intolerance with dizziness, presyncope, fatigue, hair loss, rash, GI side effects.      We have discussed at length the nature of desmoid fibromatosis tumors, the fact they can wax and wane independent of therapy, are not cancers in a metastatic potential, but can be locally infiltrative and cause substantial morbidity to the site of disease and occasionally mortality. I reviewed that the emerging standard of care is observation based on the fact that 20-30% will experience spontaneous regression in the year following diagnosis. Given her long experience with desmoids, complete regression may be less likely, but we will still approach this cautiously with parallel goals of avoidance of overtreatment (particularly surgical) and palliation of symptoms / preservation of function.     Current lesions are in the shoulder, right forearm, chest area, with a possible desmoid in her left thigh. Symptoms are manageable at the moment, after recent cryoablations and steroid injection to the forearm.     - Her poor tolerance of sorafenib was likely multifactorial: simultaneous COVID infection and difficulty with good supportive care given that she was in a busy college semester several hours away from her medical team. Would not rule out trying this again in the future with optimized supportive care  - Alternatively, pazopanib would be a reasonable option, per DESMOPAZ (82% nonprogression at 6 mos, similar RR to sorafenib)  - clinical trials, novel or off-label therapies may be reasonable if available: potential agents include DFMO, gamma secretase inhibitors, NOTCH pathway inhibitors    11/09/20: MRI without evidence of recurrent desmoids, stability of prior lesions.  02/14/21: Biopsy of thigh muscle thickening - benign, hypocellular fibrous tissue. Favor Gardner fibroma - on the desmoid spectrum but less cellular and less aggressive   02/14/21: Percutaneous steroid injection of forearm lesions.    03/19/21: Lots of active issues, although desmoids are not one of them. On TPN for diarrhea, vomiting, intolerance of food, significant malnutrition. Having to withdraw partially from school given medical issues.     06/21/21: Has had a complex few months, on TPN, significant fluid overload, caused worsening desmoid pain. Now off TPN, nutrition slowly improving, tumor pain better. Scans show 2 ill defined soft tissue nodules, concerning for recurrent desmoid. Start with surveillance, given risks of systemic therapy after recent significant GI and medical issues.     09/03/21: Scans show stable disease. Was admitted for abdominal pain, concern for intussusception, N/V, but we discussed that these may not be related to her desmoids. Opted for continued surveillance.    12/03/21: Scans show slow growth when reflecting over the past several months. Importantly, she reports substantially worsening abdominal pain which is centered on her desmoids, poor appetite, weight loss, possible transient obstructive symptoms. Long conversation about options and pros and cons of  each. She would like to trial sorafenib again, acknowledging the risks. She will meet with GI, her psychiatry team (she has had challenges with duloxetine), pain team, and may reconnect with Dr. Selena Batten in the interim to discuss other supportive care measures and procedural options.     9/28-10/13/23: Hospitalization for abdominal pain, malnutrition. Pouchoscopy 10/4 without stricture. Initiated on tube feeds. Sorafenib was initiated ~ 10/3 and tolerated reasonably well in house.    12/31/21: Tox check. Sorafenib 200mg  daily, but a variety of ongoing issues. Diarrhea (which may be drug related), ongoing abdominal pain (perhaps partially desmoid related but not exclusively), intolerance of PO diet and even tube feeds (driven by pain / cramping), rash (drug related), anxiety. Will address several factors, detailed below in the plan.    01/10/22-01/22/22: Since last visit, had 2-week hospitalization, due to uncontrolled plain in the setting of recent celiac plexus block, complicated by GI bleeding in setting of anticoagulation due to line associated DVT. She subsequently required multiple iron infusions, and was discharged on eliquis 2.5mg  BID.  During the hospitalization was noted to be persistently tachycardic, likely multifactorial etiology.  Additionally during her hospital admission was restarted on TPN in consultation with her GI team.  Patient was discharged on 01/22/2022 on TPN.        Anxiety  AYA Needs  Patient is 21 and in the past year has had the collision of numerous medical challenges which have pushed her to see her health and future medical care more clearly. Trying to balance all of her medical issues this with future goals of career, nursing, college. Significant burden of anxiety, maybe depression as well. Was reportedly diagnosed with medical trauma by the counseling office at her university. Also trying to establish her own autonomy after a medicalized childhood  - connected with AYA team, [name]. May need input from [facility] or others at some point  - seen by AYA palliative care sarcoma collaborative, [name], will assist with pain management and symptom control, as well as grappling with current issues relating to cancer, AYA, development    Tachycardia  Patient with persistent sinus tachycardia, usually around 100 bpm.  This is in the setting of anxiety and abdominal pain.  Etiology likely multifactorial as she is also unable to take good p.o., currently on TPN.  Discussed potential interactions of sorafenib, do not feel that this is significantly contributing to her tachycardia.  Plan to continue work with subspecialists to control her anxiety as well as taking steps with the pain management team to have her pain better managed.  Will give 1 L IV fluids today and continue to encourage appropriate p.o. intake.     Tumor pain: seen by AYA sarcoma palliative care, [name], as well as pain team - [name] has been working closely with her  -defer to pain team     Contraception: Not reviewed today, will need to confirm prior to initiation of TKI  -placed fertility referral today due to her interest in discussing options with them, especially prior to possible use of nirogacestat which has a risk of amenorrhea / ovarian dysfunction    Line Associated DVT:   Duplex demonstrates DVT shortly after line placement. Treated with Apixaban 5mg  BID x 3 mos, now completed    Plan:  -Continue hycosamine which was helpful for cramping / diarrhea  -add immodium if persistent diarrhea  -continue pain management with pain service  -completed celiac plexus block  -continue sorafenib at 200mg   daily (goal is eventually 400mg  daily)  -Continue to encourage wellness activities such as pickleball  -1L IVF today   -continue to follow with dermatology   -RTC 1 month for tox check    I personally reviewed the medical records, pathology and laboratory results and viewed the imaging. All questions were answered to the patient's apparent satisfaction and they voiced understanding and agreement with the plan. Pt has my card and contact information and is encouraged to reach out with any further questions.    Corene Cornea, MD  PGY2 Internal Medicine - Pediatrics    Donzetta Sprung, MD/MPH  Assistant Professor  Bone and Soft Tissue Oncology Program  [facility]  Pager: [phone]  Nurse Navigator: Roseanne Reno RN, BSN, OCN      REASON FOR CONSULTATION:   [name] [name] is a 21 y.o. female who is seen in consultation at the request of Referring, Unknown Per * for evaluation of her desmoid fibromatosis    HISTORY OF PRESENT ILLNESS:      Attends school at [employer] in [place], [place]      Right arm desmoid is the most bothersome - significant arm cramping, deep aching pain, lasts for ~30 min after cramping starts. Has noticed right hand shaking frequently. 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     Left chest lesion used to be quite uncomfortable, caused swelling in her arm and discomfort. Improved after cryoablation.     Right shoulder blade hurts when she leans back against it, fairly painful. Right chest wall lesion has some associated pain, improving since cryo.     First diagnosed at 21 years old - seen in [place] at first, had numerous cysts. First major issues were two paraspinal lesions, referred to [facility] for biopsy and ultimately resection.  Soon diagnosed with [name], seen by medical genetics early on.  Guided by Dr. Debbe Mounts, referred out to proceduralists as needed.  2011 - had a growing lesion on her back as well as bilateral neck lesions, resected at [facility], found to be desmoid  02/2012 - surgery for thoracic spine desmoid. Maybe another flank lesion removed  2017 - wrist lesion treated by VIR with bupivicaine, kenalog  08/2016 - endoscopy with adenomatous polyp of the colon, non-dysplastic  02/2017 - subcutaneous lesion resection by plastic surgery (postauricular and RUE, RLE) - path revealed epidermal inclusion cysts, osteomas  05/2018: cryotherapy of left anterior chest wall lesion  05/2018: started sulindac but struggled with adherence due to GI side effects  08/2018: cryo #2 to chest wall lesion  12/10/2018: started sorafenib due to progression in left paraspinal lesion, ?posterior chest wall lesion  02/2019: discontinued sorafenib for several reasons: fatigue, dizziness, nausea, hair loss, as well as COVID infection in late November. Derm consulted for rash / hair loss, started on antihistamines, topical steroids. Trialed lower dose but ultimately stopped again 05/2019 due to toxicity.  06/2019: follow up with surgical oncology - plans for observation rather than repeat surgery  07/2019: cryoablation of anterior chest wall desmoid  07/2019: excision of subcutaneous LE mass and L neck masses, clinically appeared consistent with cysts  08/2019: cryoablation of R posterior chest wall, left paraspinal desmoid     Interval History:  Since last visit, had 2-week hospitalization, due to uncontrolled plain in the setting of recent celiac plexus block, complicated by GI bleed, subsequently requiring multiple iron infusions.  During the hospitalization was noted to be persistently tachycardic, likely multifactorial etiology.  Additionally  during her hospital admission was restarted on TPN.  Patient was discharged on 01/22/2022 on TPN.  She is still taking some oral intake.  Since then overall has just not felt well, continues to be persistently tachycardic, running generally in the low 100s.  However she notes that the lowest she was gone while asleep was 74 bpm and has had tachycardia as high as 160s to 180s even while at rest.  Due to worsening anxiety spoke to her psychiatrist a couple of days ago who plan to increase her olanzapine to 7.5 mg nightly which she will start today.  Additionally she has seen dermatology which has helped with the difficulty she was having with her skin, however she still states that she is losing some hair.  She continues to follow with [name] her pain management specialist however continues to have significant pain.  Her pain is mostly abdominal and was in her left lower quadrant.  She has some pain in her shoulder for which she receives trigger point injections and will see you have an injection tomorrow with [name].  She denies any numbness tingling in the hands and feet.  States that she often has loose stools secondary to her J-pouch however no worsening diarrhea than usual.  She endorses persistent fatigue at baseline.  Endorses some mucositis, she received Magic mouthwash in the hospital which helped but did not receive any on discharge.    REVIEW OF SYSTEMS:  A comprehensive review of 12 systems was negative except for pertinent positives noted in HPI.    Past Medical History, Surgical History, Family History were reviewed personally. Any changes were updated as above.    Social History:  Attended [employer] in [place], has been interrupted by health issues  Wants to be a Engineer, civil (consulting) in pediatric hematology/oncology   Mother Natalia Leatherwood is a strong advocate, often present at visits    Pregnancy status: premenopausal, interested in future children    ALLERGIES/MEDICATIONS:  Reviewed in Epic    PHYSICAL EXAM:   LMP 12/16/2021 (Exact Date)   ECOG 1  Gen - well appearing, in no acute distress, mother accompanying her   Eyes - conjunctivae clear, PERRL  ENT -no scleral icterus, moist mucous membranes  Lymph - no cervical or supraclavicular lymphadenopathy appreciated  Resp - clear to auscultation bilaterally, no wheezes, crackles or rales  CV - normal rate, regular rhythm, no murmur appreciated, no lower extremity edema noted  GI - abdomen slightly firm, diffusely tender, prior surgical scars appear well-healed, no masses, bowel sounds normal  Skin - no rashes, no lesions noted  Neuro - AOx3, EOM intact, no facial droop, speech fluent and coherent, moves all extremities without asymmetry  Psych - affect normal, mood okay  MSK - no joint tenderness or swelling    PATHOLOGY:   Pathology was personally reviewed as described in the HPI, detailed in Epic.     LABS:  Reviewed in Epic    RADIOLOGY:  Imaging was personally viewed and interpreted as summarized in the history (see above).

## 2022-01-28 NOTE — Unmapped (Signed)
1315 PT arrived for scheduled infusion. Pt tolerated infusion and discharged home with family.

## 2022-01-28 NOTE — Unmapped (Addendum)
Chronic Pain Follow Up Note      Assessment and Plan    1. Chronic pain [name]    2. Diffuse myofascial pain [name]    3. Intermittent lower abdominal pain    4. Generalized abdominal pain    5. Desmoid fibromatosis    6. [name] [name]        [name] [name] is a 21 y.o. female with past medical history significant for DVT (RUE, s/p Eliquis x3 months), [name] [name] s/p colectomy, desmoid tumor, anemia, and severe protein-calorie malnutrition who is being followed at [facility] for abdominal and right shoulder pain that is consistent with the diagnosis of cancer associated pain.    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 Pt has continued and now chronic pain related to her tumors, has R shoulder pain as well. Follows with [facility] [place] [facility] Dr [name] as well as by [facility] oncology palliative care (Dr [name]) for pall care sx and pain mng.    Chronic abdominal pain  Chronic, not at goal. Has had 2 prolonged hospitalizations for severe flares and s/p celiac plexus block performed to address the first flare but caused a post-procedural pain flare resulting in a second admission for pain control. However, her abdominal pain appears to be significantly better now that the postprocedural pain flare has abated, the celiac plexus block appears to be taking, and after receiving the maximum of 7 days of a ketamine infusion while admitted.  Additionally, during the admission from 11/3-11/15 for intractable abdominal pain her primary team had concern for GIB 2/2 possibly a solitary ulcer at anastomotic site, something that may have been made worse by a short course of Toradol use to treat her postoperative pain flare.  She is also now on Eliquis given concern for clotting outweighing the concern for ongoing GIB. Urine tox screen and opioid agreement current and appropriate. Reviewed labs from 01/29/22, CMP shows normal Creatinine at 0.36, normal AST at 16, and normal ALT at 13. Today, patient reports that she is overall improving in symptoms. Some days are worse than others and she is sometimes able to only take 1-2 pills of oxycodone a day but some days has to take it every 4 hours. Patient has not been taking baclofen or dilaudid without worsening of pain. Discussed continuing to wean oxycodone as tolerated and once she is stability at 2-3 tabs/day counseled to start Butrans 10 mcg/h patch.     - Continue Gabapentin 200 mg at bedtime  - Continue Baclofen 5mg  as needed, not currently taking  - Will start Butrans patch at 10 mcg/hr after able to wean patient from current oxy regiment, informed patient to wean to #2-3 tabs of oxycodone per day for 2 days prior to initiation of Butrans and then wean completely off oxycodone over the subsequent days; also informed patient to use different locations for each patch application and avoid use of a heating pad on the patch; finally, informed patient to use Flonase on skin, let dry, and then apply patch if skin sensitivity occurs  - Continue Oxycodone 10 mg Q4H prn, patient self-weaning as tolerated, will write for 1 additional oxycodone prescription but with a decreased total number of pills given her improved clinical situation, #100 tablets designed to last 1 to 2 months    [name] of skin; [name]; Cancer associated pain; Right shoulder pain, unspecified chronicity; allodynia; diffuse  myofacial pain; chronic pain [name]  Chronic, not at goal but improved and now secondary pain generator. Her R shoulder pain appears to be primarily nociceptive with a strong nociplastic component manifesting as allodynia and hyperalgesia.  Patient has endorsed significant improvement in right shoulder function following previous TPI and patient's hyperalgesia has improved following prior TPI.  We will continue to perform trigger point injections given their efficacy in controlling the myofascial component of her overall pain but avoid dry needling and Toradol due to current Eliquis use.       - Continue Voltaren 1% gel.   - Repeat TPIs today 11/22, see same-day procedural note  - Rotation from oxycodone to Butrans as above  - Continue PT  - Encouraged slow resumption of normal activity and times when pain has decreased  - Encouraged patient to follow-up with PCP regarding her concerns about possible malnutrition    Medication Monitoring  [place] PDMP was reviewed today and it was appropriate.  Patient brought medications to clinic today for pill count.    Last urine toxicology screen: 12/04/21  Treatment agreement last reviewed and signed: 12/04/21  Aberrancy with treatment plan? no  Opioid Risk: low  Pain psychology: no  Benzodiazepines: no  Naloxone: N/A  EKG: NSR with normal Qtc (422 on 09/04/21)  MME: ~31.7    I have reviewed the [facility] Medical Board statement on use of controlled substances for the treatment of pain as well as the CDC Guideline for Prescribing Opioids for Chronic Pain. I have reviewed the [place] Controlled Substance Monitoring Database.      Return for january or early feb with CPP or NP.      PLAN:  Today I have prescribed:  Requested Prescriptions     Signed Prescriptions Disp Refills    buprenorphine 10 mcg/hour PTWK transdermal patch 4 patch 2     Sig: Place 1 patch on the skin every seven (7) days.    oxyCODONE (ROXICODONE) 10 mg immediate release tablet 100 tablet 0     Sig: Take 1 tablet (10 mg total) by mouth every four (4) hours as needed for pain. Fill on or after: 03/01/22       No orders of the defined types were placed in this encounter.       HPI  [name] [name] is a 21 y.o. being followed at [facility] for complaint of chronic pain localized to her shoulders and abdomen.      Patient was last seen on October, at which time the patient reported worsening over the last 4 months and currently her worst pain.  Patient noted that her abdominal pain would flare frequently but subsided after each flare, most recently admitted for several days given abdominal pain.  She noted being told by other physicians that it might be due to intermittent obstruction of her bowels from her abdominal desmoid tumors.  Pain was described as predominately in left lower quadrant with new pain in the right lower quadrant.  Abdominal pain currently managed with Gabapentin with goal to increase to 300 mg nightly, baclofen, and a combination of oxycodone and dilaudid.  Patient was currently taking oxy 10 mg Q4H during the day and 3 mg Dilaudid nightly.  Goal was to eventually start patient on Butran patch for long term treatment of her chronic abdominal pain given that full-mu agonists were not indicated for the treatment of chronic abdominal pain; however, her current pain flare had necessitated the use of opioids at doses  that preclude a transition to [facility] for fear of precipitating withdrawal.  Would therefore defer a rotation to Butran patches until patient was able to wean down her current opioid regiment.  Additionally, her September CT showed masses in LLQ, LUQ and periumbilical regions. The RLQ pain and the masses in the periumbilical and LUQ regions would be covered by a celiac plexus block while the pain in the LLQ might also be partly covered, though if a celiac plexus block failed to cover the LLQ then an inferior hypogastric plexus block would be the next best step.  Planned to perform block at next visit and if abdominal pain was improved, then planned to wean down oxy and dilaudid and then transition to butran patch.  Urine tox screen and opioid agreement current and appropriate.  Reviewed labs from 12/31/2021, CMP showed normal Creatinine at 0.43, normal AST at 18, and normal ALT at 11. Patient had a multi-year history of [name] s/p colectomy and multiple desmoid tumors, historically over the lateral aspect of right forearm and posterior aspect of right upper arm.  She had been utilizing oxycodone, gabapentin, and lidocaine with moderate benefit, though her pain continued to greatly impact her ability to be active, drive, and participate in school.  Her R shoulder pain appeared to be primarily nociceptive with a strong nociplastic component manifesting as allodynia and hyperalgesia.  Patient had endorsed significant improvement in right shoulder function following previous TPI and patient's hyperalgesia had improved following prior TPI.  We continued to perform trigger point injections given their efficacy in controlling the myofascial component of her overall pain.     Since last visit, the patient has followed with Hematology, Psychiatry, Derm, Nutrition, and Hem Onc. Patient underwent bilateral celiac plexus block with our clinic on 01/08/22. Patient reported via nurse line on 01/09/22 that she was having diffuse abdominal pain of the upper right quadrant and right lower back pain into the right posterior leg. [name] addressed phone call with the patient's mother. She presented to the ED on 01/10/22 and was admitted until 01/22/22 for abdominal pain. She reports via MyChart having severe agitation in which she inquired about medication to treat.     Today, patient reports overall improved pain control and symptoms since hospitalization 11/3-11/15. Some days are better than others but she has been trying to wean her oxycodone. Reports that some days she only has to take 1-2 pills but other days she needs to take it every 4 hours depending on her activity level. Requires more pain medications when she does more activities. She has been able to self discontinue her dilaudid and baclofen without increases in pain. Is interested in TPIs during this appointment.    Current analgesic regimen:  - Gabapentin 200 mg bedtime  - Oxycodone 10mg  q4 PRN, wean as tolerated  - Tylenol PRN  - Voltaren 1% gel    The patient states her pain is located in her abdomen and R shoulder blade and the severity of her pain ranges from 5/10 to 10/10.  Her pain currently is 5/10 and on average is 6/10.  She describes the sensation of her pain as aching, pressing, pulling, sharp, tender. Her pain is present all of the time and worst during the day. The patient???s pain impacts enjoyment of life, general activity, mood, normal work, recreational activities, sleep. Her interval history includes hospitalization, ER visit. Her pain is better, and she does not have new pain to discuss today. She is on blood thinners or anti-coagulants.  In regards to medications currently taken for pain management, the patient is tolerating these medications well and complains of associated side effects: None.      Previous Medication Trials:  NSAIDS: Toradol (c/f GIB after)  Antidepressant/anxiety: Cymbalta (stopped due to mood side effects), amitriptyline (insomnia and increased J-pouch output)  Anticonvulsants: Gabapentin  Muscle relaxants: Robaxin  Topicals: lidocaine, coltaren gel  Short-acting opiates: oxycodone, PO HM  Long-acting opiates:   Other: Tylenol    Previous Interventions  Procedures:   - TPIs in August 2023 with initial pain flare, subsequent improvement, and near immediate relief flare of pain; hyperalgesic with procedure  - TPIs of Right trapezius, rhomboid, infraspinatus on 12/04/2021.  - TPIs on 10/25  - Celiac plexus block on 11/1 (severe post-procedural pain flare requiring admission for pain control, significant hyperalgesia)  - TPIs 11/22    PT: Yes    Allergies  Allergies   Allergen Reactions    Adhesive Tape-Silicones Hives and Rash     Paper tape  And tegederm ok    Ferrlecit [Sodium Ferric Gluconat-Sucrose] Swelling and Rash    Levofloxacin Swelling and Rash     Swelling in mouth, rash,     Methylnaltrexone      Per Patient: I lost bowel control, severe abdominal cramping, and elevated BP    Neomycin Swelling     Rxn after ear drops; ear swelling    Papaya Hives    Morphine Nausea And Vomiting Zosyn [Piperacillin-Tazobactam] Itching and Rash     Red and itchy    Compazine [Prochlorperazine] Other (See Comments)     Extreme agitation    Latex, Natural Rubber Rash       Home Medications    Current Outpatient Medications   Medication Sig Dispense Refill    acetaminophen (TYLENOL) 500 MG tablet Take 2 tablets (1,000 mg total) by mouth two (2) times a day.      apixaban (ELIQUIS) 2.5 mg Tab Take 1 tablet (2.5 mg total) by mouth two (2) times a day. 60 tablet 0    baclofen (LIORESAL) 5 mg Tab tablet Take 1-2 tablets (5-10 mg total) by mouth two (2) times a day as needed for muscle spasms. 40 tablet 0    cefdinir (OMNICEF) 300 MG capsule Take 1 capsule (300 mg total) by mouth Two (2) times a day. 180 capsule 1    COURIERED MED OR SUPPLY [id number], SORAFENIB 200MG  TABLET 1 each 0    diclofenac sodium (VOLTAREN) 1 % gel Apply 2 g topically four (4) times a day. 100 g 0    diphenhydrAMINE (BENADRYL) 50 mg capsule Take 1 capsule (50 mg total) by mouth every six (6) hours as needed for itching (per patient preference (if prefers over hydroxyzine)). 30 each 0    famotidine (PEPCID) 20 MG tablet Take 1 tablet (20 mg total) by mouth nightly. 90 tablet 3    gabapentin (NEURONTIN) 100 MG capsule Take 2 capsules (200 mg total) by mouth nightly. 60 capsule 0    hydrocortisone (ANUSOL-HC) 25 mg suppository Insert 1 suppository (25 mg total) into the rectum two (2) times a day as needed for hemorrhoids (possible rectal/anal bleeding source). 30 suppository 0    hydrocortisone 2.5 % cream Apply 1 application. topically daily as needed.      hydrOXYzine (ATARAX) 50 MG tablet Take 1 tablet (50 mg total) by mouth every six (6) hours as needed for itching or anxiety (also insomnia). 30 tablet 0    hyoscyamine (LEVSIN) 0.125  mg tablet Take 1 tablet (0.125 mg total) by mouth every four (4) hours as needed for cramping.      lidocaine (LIDODERM) 5 % patch Place 1 patch on the skin daily. Apply to affected area for 12 hours only each day (then remove patch)      menthol-zinc oxide (CALMOSEPTINE) 0.44-20.6 % Oint Apply topically four (4) times a day as needed. 113 g 11    mirtazapine (REMERON) 7.5 MG tablet Take 1 tablet (7.5 mg total) by mouth nightly. 90 tablet 3    naloxone (NARCAN) 4 mg nasal spray One spray in either nostril once for known/suspected opioid overdose. May repeat every 2-3 minutes in alternating nostril til EMS arrives 2 each 0    NIFEdipine 0.3% lidocaine 1.5% in petrolatum ointment Apply topically two (2) times a day. 30 g 0    OLANZapine (ZYPREXA) 7.5 MG tablet Take 1 tablet (7.5 mg total) by mouth nightly. 30 tablet 2    ondansetron (ZOFRAN-ODT) 8 MG disintegrating tablet Take 1 tablet (8 mg total) by mouth every twelve (12) hours as needed for nausea.      pimecrolimus (ELIDEL) 1 % cream Apply topically two (2) times a day. To face as needed for rash 30 g 11    silver sulfaDIAZINE (SILVADENE) 1 % cream Apply topically daily. To burn on leg 50 g 11    SLEEPY BUTTER Swish and spit 10 mL 4 times a day as needed. 300 mL 3    SORAfenib (NEXAVAR) 200 mg tablet Take 1 tablet (200 mg total) by mouth daily. 60 tablet 2    triamcinolone (KENALOG) 0.1 % cream Apply topically two (2) times a day. 28 g 3    zinc oxide 10 % Crea Apply 1 application. topically daily as needed.      buprenorphine 10 mcg/hour PTWK transdermal patch Place 1 patch on the skin every seven (7) days. 4 patch 2    oxyCODONE (ROXICODONE) 10 mg immediate release tablet Take 1 tablet (10 mg total) by mouth every four (4) hours as needed for pain. Fill on or after: 03/01/22 100 tablet 0     No current facility-administered medications for this visit.       ROS  General difficulty sleeping  Cardiovascular fainting  Gastrointestinal bloody stool/black stool and nausea  Skin skin changes  Endocrine none  Musculoskeletal none  Neurologic none  Psychiatric anxiety        Physical Exam    VITALS:   Vitals:    01/29/22 1025   BP: 108/70   Pulse: 103   Resp: 16   Temp: 36.7 ??C (98.1 ??F)   SpO2: 98%           Wt Readings from Last 3 Encounters:   01/29/22 57.4 kg (126 lb 9.6 oz)   01/28/22 53.8 kg (118 lb 8 oz)   01/20/22 51.3 kg (113 lb)     GENERAL:  The patient is well developed, well-nourished and appears to be in No distress. The patient is pleasant and interactive. Patient is a good historian.  HEAD/NECK:    Reveals normocephalic/atraumatic. Clear sclera. Mask in place.  LUNGS:   Normal work of breathing, no supplemental O2.  EXTREMITIES:  No clubbing, cyanosis noted.  NEUROLOGIC:    The patient was alert and oriented, speech fluent, normal language. CN 2-12 grossly intact.  MUSCULOSKELETAL:    Motor function  5/5 in all extremities with normal tone and bulk. Good range of motion of all extremities.  The patient was able to ambulate without difficult throughout the clinic today without the assistance of a walking aid.   SKIN:   No obvious rashes, lesions, or erythema.  PSY:  Appropriate affect.

## 2022-01-28 NOTE — Unmapped (Signed)
It was a pleasure to see you in clinic today.    For appointments & questions Monday through Friday 8 AM--5 PM     Please call [phone] or Toll free [phone]    For urgent clinical needs on Nights, Weekends or Holidays:  Call [phone] and ask for the oncologist on call.     For appointment changes please contact during normal business hours.     Please visit [email or web address], a resource created just for family members and caregivers.  This website lists support services, how and where to ask for help. It has tools to assist you as you help [name] care for your loved one.    Donzetta Sprung, MD, MPH  Assistant Professor  Bone and Soft Tissue Oncology Program  Nurse Navigator: Roseanne Reno    [facility]  [address]  [place], [place] [address]  [email or web address]

## 2022-01-29 ENCOUNTER — Ambulatory Visit
Admit: 2022-01-29 | Discharge: 2022-01-30 | Payer: PRIVATE HEALTH INSURANCE | Attending: Student in an Organized Health Care Education/Training Program | Primary: Student in an Organized Health Care Education/Training Program

## 2022-01-29 DIAGNOSIS — G894 Chronic pain syndrome: Principal | ICD-10-CM

## 2022-01-29 DIAGNOSIS — M7918 Myalgia, other site: Principal | ICD-10-CM

## 2022-01-29 DIAGNOSIS — R112 Nausea with vomiting, unspecified: Secondary | ICD-10-CM | POA: Diagnosis not present

## 2022-01-29 DIAGNOSIS — Z932 Ileostomy status: Secondary | ICD-10-CM | POA: Diagnosis not present

## 2022-01-29 DIAGNOSIS — Q8789 Other specified congenital malformation syndromes, not elsewhere classified: Secondary | ICD-10-CM | POA: Diagnosis not present

## 2022-01-29 DIAGNOSIS — R1084 Generalized abdominal pain: Secondary | ICD-10-CM | POA: Diagnosis not present

## 2022-01-29 DIAGNOSIS — R1030 Lower abdominal pain, unspecified: Secondary | ICD-10-CM | POA: Diagnosis not present

## 2022-01-29 DIAGNOSIS — D48119 Desmoid tumor of unspecified site: Secondary | ICD-10-CM | POA: Diagnosis not present

## 2022-01-29 DIAGNOSIS — E860 Dehydration: Secondary | ICD-10-CM | POA: Diagnosis not present

## 2022-01-29 MED ORDER — BUPRENORPHINE 10 MCG/HOUR WEEKLY TRANSDERMAL PATCH
MEDICATED_PATCH | TRANSDERMAL | 2 refills | 28 days | Status: CP
Start: 2022-01-29 — End: ?
  Filled 2022-03-27: qty 4, 28d supply, fill #0

## 2022-01-29 NOTE — Unmapped (Signed)
Medication:oxyCODONE (ROXICODONE) 10 mg immediate release tablet   NGE:XBMW 1 tablet (10 mg total) by mouth every four (4) hours as needed for pain.   Quantity on RX: #180  Filled on:01/01/2022  Pill count today: #114

## 2022-01-30 DIAGNOSIS — E860 Dehydration: Secondary | ICD-10-CM | POA: Diagnosis not present

## 2022-01-30 DIAGNOSIS — R112 Nausea with vomiting, unspecified: Secondary | ICD-10-CM | POA: Diagnosis not present

## 2022-01-30 DIAGNOSIS — R1084 Generalized abdominal pain: Secondary | ICD-10-CM | POA: Diagnosis not present

## 2022-01-30 DIAGNOSIS — Z932 Ileostomy status: Secondary | ICD-10-CM | POA: Diagnosis not present

## 2022-01-30 MED ORDER — OXYCODONE 10 MG TABLET
ORAL_TABLET | ORAL | 0 refills | 17 days | Status: CP | PRN
Start: 2022-01-30 — End: 2022-03-01

## 2022-01-30 NOTE — Unmapped (Signed)
Addended by: Jolyn Lent. on: 01/30/2022 10:34 AM     Modules accepted: Orders

## 2022-01-30 NOTE — Unmapped (Signed)
It was good to see you today!    We did trigger point injections and can repeat these after I am back from leave.     I will also refill some oxycodone, though less than your previous prescription as we hope your pain flare will continue to subside. Instead, try to decrease your oxycodone use and once you are at 2-3 pills of the 10 mg's per day (or ~4 of the 1/2 pills per day) for 2-3 days then you can put on the Butrans patch (Rx sent to your pharmacy) on. You can continue to use oxycodone while the first patch is on and building up in your system but after that try not to use more oxycodone (if you need to though for a pain flare, that is ok).    Otherwise have a wonderful thanksgiving and Christmas!

## 2022-01-30 NOTE — Unmapped (Signed)
Trigger Point Injection of 3+ muscle groups: Right trapezius, rhomboid, infraspinatus, teres minor  Pre-operative diagnosis: myofascial pain  Post-operative diagnosis: Same    Attending Physician: Dr. Darlis Loan  Assistant: Dr. Consuello Closs    21 y.o. 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 She [facility] for chronic pain related to abdominal and right shoulder pain in the setting of her desmoid tumors. She is active in PT and on a multimodal pain regimen that includes PRN oxycodone, which she is trying to wean off of. We will attempt TPIs today to improve her pain control and help wean oxycodone. Her physical exam on 10/02/21 was concerning for peri-scapular trigger points, likely due to compensatory strain on the muscles in the setting of scapular winging due to the desmoid tumor.     Please see same-day clinic note for more details regarding her interval medical history; patient now on Eliquis so will not perform dry needling with today's TPIs. Patient also had Toradol after pain flare s/p celiac plexus block but subsequently had down-trending hgb, potentially due to GIB, thus will forego Toradol from here on.     Serial consent signed on 10/16/21    After risks and benefits were explained including bleeding, infection, worsening of the pain, damage to the area being injected, weakness, allergic reaction to medications, vascular injection, pneumothorax, nerve damage, and the possibility of worsening desmoid tumor symptoms/growths (though I noted with them that I was unable to find literature indicating that injections of healthy muscle in patients with desmoid tumors triggers new desmoid tumor growth), signed consent was obtained.  All questions were answered.   The patient is taking antiplatelet or anticoagulation medications (now on Eliquis) and does  have a driver today.    The area of the trigger point was identified and the skin prepped with Chloraprep.  A 27 gauge 0.5 inch needle was placed in the area of the trigger point.  Dry needling WAS NOT (patient now on Eliquis) performed in the area of the trigger point.  Once reproduction of the pain was elicited and negative aspiration confirmed, the trigger point was injected with 1 mL of 0.25% bupivacaine and the needle was removed.      The patient did tolerate the procedure well and there were not complications.      Trigger points injected: 15    Trigger point locations:  Right trapezius, rhomboid, infraspinatus, teres minor    The patient was monitored for 15 minutes after the procedure.  Vital signs remained normal and the patient ambulated out of clinic.    Pre-procedure pain score: 6/10  Post-procedure pain score: 7/10    DISPO:    Pt to follow up in Jan or Feb with NP or CPP

## 2022-01-31 DIAGNOSIS — Z932 Ileostomy status: Secondary | ICD-10-CM | POA: Diagnosis not present

## 2022-01-31 DIAGNOSIS — R112 Nausea with vomiting, unspecified: Secondary | ICD-10-CM | POA: Diagnosis not present

## 2022-01-31 DIAGNOSIS — E860 Dehydration: Secondary | ICD-10-CM | POA: Diagnosis not present

## 2022-01-31 DIAGNOSIS — R1084 Generalized abdominal pain: Secondary | ICD-10-CM | POA: Diagnosis not present

## 2022-02-01 DIAGNOSIS — E860 Dehydration: Secondary | ICD-10-CM | POA: Diagnosis not present

## 2022-02-01 DIAGNOSIS — Z932 Ileostomy status: Secondary | ICD-10-CM | POA: Diagnosis not present

## 2022-02-01 DIAGNOSIS — R112 Nausea with vomiting, unspecified: Secondary | ICD-10-CM | POA: Diagnosis not present

## 2022-02-01 DIAGNOSIS — R1084 Generalized abdominal pain: Secondary | ICD-10-CM | POA: Diagnosis not present

## 2022-02-02 DIAGNOSIS — Z932 Ileostomy status: Secondary | ICD-10-CM | POA: Diagnosis not present

## 2022-02-02 DIAGNOSIS — R1084 Generalized abdominal pain: Secondary | ICD-10-CM | POA: Diagnosis not present

## 2022-02-02 DIAGNOSIS — R112 Nausea with vomiting, unspecified: Secondary | ICD-10-CM | POA: Diagnosis not present

## 2022-02-02 DIAGNOSIS — E860 Dehydration: Secondary | ICD-10-CM | POA: Diagnosis not present

## 2022-02-03 DIAGNOSIS — R1084 Generalized abdominal pain: Secondary | ICD-10-CM | POA: Diagnosis not present

## 2022-02-03 DIAGNOSIS — Z932 Ileostomy status: Secondary | ICD-10-CM | POA: Diagnosis not present

## 2022-02-03 DIAGNOSIS — E860 Dehydration: Secondary | ICD-10-CM | POA: Diagnosis not present

## 2022-02-03 DIAGNOSIS — R112 Nausea with vomiting, unspecified: Secondary | ICD-10-CM | POA: Diagnosis not present

## 2022-02-04 DIAGNOSIS — E860 Dehydration: Secondary | ICD-10-CM | POA: Diagnosis not present

## 2022-02-04 DIAGNOSIS — R112 Nausea with vomiting, unspecified: Secondary | ICD-10-CM | POA: Diagnosis not present

## 2022-02-04 DIAGNOSIS — Z932 Ileostomy status: Secondary | ICD-10-CM | POA: Diagnosis not present

## 2022-02-04 DIAGNOSIS — R1084 Generalized abdominal pain: Secondary | ICD-10-CM | POA: Diagnosis not present

## 2022-02-04 NOTE — Unmapped (Signed)
[facility] Refill Coordination Note    Specialty Medication(s) to be Shipped:   Hematology/Oncology: Sorafenib 200 mg    Other medication(s) to be shipped: No additional medications requested for fill at this time     [name] [name], DOB: Jul 22, 2000  Phone: [phone] (home) [phone] (work)      All above HIPAA information was verified with patient.     Was a Nurse, learning disability used for this call? No    Completed refill call assessment today to schedule patient's medication shipment from the [facility] [phone]).  All relevant notes have been reviewed.     Specialty medication(s) and dose(s) confirmed: Regimen is correct and unchanged.   Changes to medications: [name] reports no changes at this time.  Changes to insurance: No  New side effects reported not previously addressed with a pharmacist or physician: None reported  Questions for the pharmacist: No    Confirmed patient received a Conservation officer, historic buildings and a Surveyor, mining with first shipment. The patient will receive a drug information handout for each medication shipped and additional FDA Medication Guides as required.       DISEASE/MEDICATION-SPECIFIC INFORMATION        N/A    SPECIALTY MEDICATION ADHERENCE     Medication Adherence    Patient reported X missed doses in the last month: 0  Specialty Medication: Sorafenib 200 mg  Patient is on additional specialty medications: No  Informant: patient                       Were doses missed due to medication being on hold? No    Sorafenib 200 mg: 10 days of medicine on hand       REFERRAL TO PHARMACIST     Referral to the pharmacist: Not needed      [facility]     Shipping address confirmed in Epic.     Delivery Scheduled: Yes, Expected medication delivery date: 02/11/22.     Medication will be delivered via UPS to the prescription address in Epic [place].    Wyatt Mage M Elisabeth Cara   [facility] Specialty Technician

## 2022-02-05 ENCOUNTER — Ambulatory Visit: Admit: 2022-02-05 | Discharge: 2022-02-06 | Payer: PRIVATE HEALTH INSURANCE

## 2022-02-05 DIAGNOSIS — L309 Dermatitis, unspecified: Principal | ICD-10-CM

## 2022-02-05 DIAGNOSIS — L651 Anagen effluvium: Principal | ICD-10-CM

## 2022-02-05 DIAGNOSIS — R1084 Generalized abdominal pain: Secondary | ICD-10-CM | POA: Diagnosis not present

## 2022-02-05 DIAGNOSIS — Z932 Ileostomy status: Secondary | ICD-10-CM | POA: Diagnosis not present

## 2022-02-05 DIAGNOSIS — R112 Nausea with vomiting, unspecified: Secondary | ICD-10-CM | POA: Diagnosis not present

## 2022-02-05 DIAGNOSIS — E860 Dehydration: Secondary | ICD-10-CM | POA: Diagnosis not present

## 2022-02-05 MED ORDER — CLOBETASOL 0.05 % TOPICAL OINTMENT
Freq: Two times a day (BID) | TOPICAL | 3 refills | 0.00000 days | Status: CP
Start: 2022-02-05 — End: 2023-02-05

## 2022-02-05 NOTE — Unmapped (Addendum)
Continue olay to entire face    Increase pimecrolimus (Elidel) to 2x/day to red rough areas on face (NOT STEROID)    Use clobetasol to ears 2x/day until clear (STRONGER steroid)    - also good for hands/feet  Triamcinolone for mild itchy areas on body when rashy    But today on body you only need moisturizer (send photo of hospital moisturizer)

## 2022-02-05 NOTE — Unmapped (Signed)
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 [name] -- discussed this is good for when her scalp is itching   - continue hydrating shampoo on other days  - Restart clobetasol solution on the scalp 1-2 times daily if needed for flaring. Avoid the face.               - Can use between flares  - Continue clobetasol ointment on the hands and feet twice daily if needed for itching and scaling. Use for 1-2 weeks, then stop. Re-start if needed.   - START ON EARS  - Continue triamcinolone ointment twice daily on dry, itchy, scaly areas on the body UNTIL CLEAR. Re-start if needed.  - CONSIDER sarna OTC as needed during the day for pruritus    [name] [name] TO SEND PHOTO OF EMOLLIENT FROM HOSPITAL      -     clobetasoL (TEMOVATE) 0.05 % ointment; Apply topically two (2) times a day. To stubborn/thick rashes on hands/feet ears until clear/smooth. Restart as needed       Likely allergic contact dermatitis to something in adhesives given response to skin glue and duoderm and biopatch  - discussed Patch testing  - [name] [name] would like to defer for now -- doing ok  - reconsider if recurs       Anagen effluvium complicated by telogen effluvium in the past:  - will watch for worsening with restarting sorafenib     Keratosis Pilaris secondary to sorafenib  - sarna cream and triamcinolone as above       Education was provided by discussing the etiology, natural history, course and treatment for the above conditions.  Reassurance and anticipatory guidance were provided.    The patient was advised to call for an appointment should any new, changing, or symptomatic lesions develop.     RTC: Return in about 8 weeks (around 04/03/2022). or sooner as needed   _________________________________________________________________    Chief Complaint     rashes      HPI     [name] [name] is a 21 y.o. female who presents as a returning patient (last seen 01/03/2022) to Dermatology for follow up of rashes. History provided by [name] [name] and [name] [name].    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; restarted 12/2021   - still thinking about increasing dose  Struggling with chronic pain and recently admitted for pain control  Now working toward stability and starting patch for pain after celiac plexus block    Face is a lot better  NG tube is gone  Has central line with TPN  Received pimecrolimus in the hospital -- using 1x/day, decreased to a few times a week when better  [place] sensitive soothing moisturizer with colloidal oat  Using hyaluronic acid spray with goal of emollient    Got a bottle of emollient in the hospital that is helping a lot for the body    Ears flaring  Painful to touch  But not itchy  Only using elidel intermittently    Scalp has been ok  Hair shedding more  Washing once a week with [name] [name] at home -- not using the clobetasol shampoo  Not itching on scalp  Starting to shed a little on eyebrows    Hands and feet have been ok  Nails breaking a lot      Pertinent Past Medical History     Active Ambulatory Problems     Diagnosis Date Noted    [name] 08/25/2012    Intestinal polyps 08/25/2012    [name] 08/25/2012    Benign neoplasm of skin of trunk 01/17/2004    Other benign neoplasm of connective and other soft tissue of upper limb, including shoulder 02/26/2011    History of colonic polyps 01/09/2012    [name] of skin 08/25/2012    Thyroid cyst 10/05/2018    Syncope 05/14/2019    Chemotherapy-induced nausea 05/14/2019    Pain 08/04/2019    Iron deficiency anemia due to chronic blood loss 11/10/2019    Dehydration 10/14/2020    Physical deconditioning 03/15/2021    History of colectomy 03/15/2021    Acute deep vein thrombosis (DVT) of axillary vein of right upper extremity (CMS-HCC) 03/20/2021    Severe protein-calorie malnutrition (CMS-HCC) 03/20/2021    Winged scapula of right side 07/16/2021    Lower abdominal pain 08/27/2021    Intractable abdominal pain 12/05/2021    Generalized anxiety disorder with panic attacks 12/09/2021    Hyperglycemia 02/28/2021    Hypoglycemia 02/28/2021    SBO (small bowel obstruction) (CMS-HCC) 02/16/2021     Resolved Ambulatory Problems     Diagnosis Date Noted    No Resolved Ambulatory Problems     Past Medical History:   Diagnosis Date    Abdominal pain     Acid reflux     Anesthesia complication     Cancer (CMS-HCC)     Cataract of right eye     COVID-19 virus infection 01/2019    Cyst of thyroid determined by ultrasound     Difficult intravenous access     [name] (familial adenomatous polyposis)     Gastric polyps     History of chemotherapy     History of colon polyps     History of COVID-19 01/2019    PONV (postoperative nausea and vomiting)     Rectal bleeding     Syncopal episodes        Family History   Problem Relation Age of Onset    No Known Problems Mother     No Known Problems Father     No Known Problems Sister     No Known Problems Brother     Stroke Maternal Grandmother     Other Maternal Grandmother         benign lesions of liver and pancreas, further details unknown    Cancer Maternal Grandmother     Diabetes Maternal Grandmother     Hypertension Maternal Grandmother     Thyroid disease Maternal Grandmother     Arthritis Maternal Grandfather     Asthma Maternal Grandfather     COPD Paternal Grandmother         Deceased    Miscarriages / Stillbirths Paternal Grandmother     Alcohol abuse Paternal Grandfather         Deceased    No Known Problems Maternal Aunt     No Known Problems Maternal Uncle     No Known Problems  Paternal Aunt No Known Problems Paternal Uncle     Anesthesia problems Neg Hx     Broken bones Neg Hx     Cancer Neg Hx     Clotting disorder Neg Hx     Collagen disease Neg Hx     Diabetes Neg Hx     Dislocations Neg Hx     Fibromyalgia Neg Hx     Gout Neg Hx     Hemophilia Neg Hx     Osteoporosis Neg Hx     Rheumatologic disease Neg Hx     Scoliosis Neg Hx     Severe sprains Neg Hx     Sickle cell anemia Neg Hx     Spinal Compression Fracture Neg Hx     Melanoma Neg Hx     Basal cell carcinoma Neg Hx     Squamous cell carcinoma Neg Hx        Medications:  Current Outpatient Medications   Medication Sig Dispense Refill    acetaminophen (TYLENOL) 500 MG tablet Take 2 tablets (1,000 mg total) by mouth two (2) times a day.      apixaban (ELIQUIS) 2.5 mg Tab Take 1 tablet (2.5 mg total) by mouth two (2) times a day. 60 tablet 0    baclofen (LIORESAL) 5 mg Tab tablet Take 1-2 tablets (5-10 mg total) by mouth two (2) times a day as needed for muscle spasms. 40 tablet 0    buprenorphine 10 mcg/hour PTWK transdermal patch Place 1 patch on the skin every seven (7) days. 4 patch 2    cefdinir (OMNICEF) 300 MG capsule Take 1 capsule (300 mg total) by mouth Two (2) times a day. 180 capsule 1    COURIERED MED OR SUPPLY [id number], SORAFENIB 200MG  TABLET 1 each 0    diclofenac sodium (VOLTAREN) 1 % gel Apply 2 g topically four (4) times a day. 100 g 0    diphenhydrAMINE (BENADRYL) 50 mg capsule Take 1 capsule (50 mg total) by mouth every six (6) hours as needed for itching (per patient preference (if prefers over hydroxyzine)). 30 each 0    famotidine (PEPCID) 20 MG tablet Take 1 tablet (20 mg total) by mouth nightly. 90 tablet 3    gabapentin (NEURONTIN) 100 MG capsule Take 2 capsules (200 mg total) by mouth nightly. 60 capsule 0    hydrocortisone (ANUSOL-HC) 25 mg suppository Insert 1 suppository (25 mg total) into the rectum two (2) times a day as needed for hemorrhoids (possible rectal/anal bleeding source). 30 suppository 0 hydrocortisone 2.5 % cream Apply 1 application. topically daily as needed.      hydrOXYzine (ATARAX) 50 MG tablet Take 1 tablet (50 mg total) by mouth every six (6) hours as needed for itching or anxiety (also insomnia). 30 tablet 0    hyoscyamine (LEVSIN) 0.125 mg tablet Take 1 tablet (0.125 mg total) by mouth every four (4) hours as needed for cramping.      lidocaine (LIDODERM) 5 % patch Place 1 patch on the skin daily. Apply to affected area for 12 hours only each day (then remove patch)      menthol-zinc oxide (CALMOSEPTINE) 0.44-20.6 % Oint Apply topically four (4) times a day as needed. 113 g 11    mirtazapine (REMERON) 7.5 MG tablet Take 1 tablet (7.5 mg total) by mouth nightly. 90 tablet 3    naloxone (NARCAN) 4 mg nasal spray One spray in either nostril once for known/suspected opioid overdose. May repeat every  2-3 minutes in alternating nostril til EMS arrives 2 each 0    NIFEdipine 0.3% lidocaine 1.5% in petrolatum ointment Apply topically two (2) times a day. 30 g 0    OLANZapine (ZYPREXA) 7.5 MG tablet Take 1 tablet (7.5 mg total) by mouth nightly. 30 tablet 2    ondansetron (ZOFRAN-ODT) 8 MG disintegrating tablet Take 1 tablet (8 mg total) by mouth every twelve (12) hours as needed for nausea.      oxyCODONE (ROXICODONE) 10 mg immediate release tablet Take 1 tablet (10 mg total) by mouth every four (4) hours as needed for pain. Fill on or after: 03/01/22 100 tablet 0    pimecrolimus (ELIDEL) 1 % cream Apply topically two (2) times a day. To face as needed for rash 30 g 11    silver sulfaDIAZINE (SILVADENE) 1 % cream Apply topically daily. To burn on leg 50 g 11    SLEEPY BUTTER Swish and spit 10 mL 4 times a day as needed. 300 mL 3    SORAfenib (NEXAVAR) 200 mg tablet Take 1 tablet (200 mg total) by mouth daily. 60 tablet 2    triamcinolone (KENALOG) 0.1 % cream Apply topically two (2) times a day. 28 g 3    zinc oxide 10 % Crea Apply 1 application. topically daily as needed.       No current facility-administered medications for this visit.       Allergies   Allergen Reactions    Adhesive Tape-Silicones Hives and Rash     Paper tape  And tegederm ok    Ferrlecit [Sodium Ferric Gluconat-Sucrose] Swelling and Rash    Levofloxacin Swelling and Rash     Swelling in mouth, rash,     Methylnaltrexone      Per Patient: I lost bowel control, severe abdominal cramping, and elevated BP    Neomycin Swelling     Rxn after ear drops; ear swelling    Papaya Hives    Morphine Nausea And Vomiting    Zosyn [Piperacillin-Tazobactam] Itching and Rash     Red and itchy    Compazine [Prochlorperazine] Other (See Comments)     Extreme agitation    Latex, Natural Rubber Rash         ROS: Other than symptoms mentioned in the HPI, no fevers, chills, or other skin complaints    Physical Examination     LMP 12/16/2021 (Exact Date)     GENERAL: Well-appearing female in no acute distress, resting comfortably.  NEURO: Alert and age appropriate interaction  RESP: No increased work of breathing  CV: Inspection and palpation of lower extremities without evidence of edema  SKIN (waist up): Examination was performed of the head, neck, chest, back, abdomen, and bilateral upper extremities and SKIN (waist down): Examination was performed of the bilateral lower extremities was performed     Palms and soles clear  Bilateral ears with pink scaly papules  Left cheek with think pink scaly plaque  Diffuse xerosis    All areas not commented on are within normal limits or unremarkable

## 2022-02-06 ENCOUNTER — Ambulatory Visit
Admit: 2022-02-06 | Discharge: 2022-02-06 | Payer: PRIVATE HEALTH INSURANCE | Attending: Gastroenterology | Primary: Gastroenterology

## 2022-02-06 ENCOUNTER — Ambulatory Visit: Admit: 2022-02-06 | Discharge: 2022-02-06 | Payer: PRIVATE HEALTH INSURANCE

## 2022-02-06 DIAGNOSIS — Z789 Other specified health status: Principal | ICD-10-CM

## 2022-02-06 DIAGNOSIS — D1391 FAP (familial adenomatous polyposis): Principal | ICD-10-CM

## 2022-02-06 DIAGNOSIS — R197 Diarrhea, unspecified: Principal | ICD-10-CM

## 2022-02-06 DIAGNOSIS — D509 Iron deficiency anemia, unspecified: Secondary | ICD-10-CM | POA: Diagnosis not present

## 2022-02-06 DIAGNOSIS — K91850 Pouchitis: Secondary | ICD-10-CM | POA: Diagnosis not present

## 2022-02-06 DIAGNOSIS — G8929 Other chronic pain: Secondary | ICD-10-CM | POA: Diagnosis not present

## 2022-02-06 DIAGNOSIS — Z932 Ileostomy status: Secondary | ICD-10-CM | POA: Diagnosis not present

## 2022-02-06 DIAGNOSIS — R1084 Generalized abdominal pain: Secondary | ICD-10-CM | POA: Diagnosis not present

## 2022-02-06 DIAGNOSIS — R112 Nausea with vomiting, unspecified: Secondary | ICD-10-CM | POA: Diagnosis not present

## 2022-02-06 DIAGNOSIS — D485 Neoplasm of uncertain behavior of skin: Secondary | ICD-10-CM | POA: Diagnosis not present

## 2022-02-06 DIAGNOSIS — R109 Unspecified abdominal pain: Secondary | ICD-10-CM | POA: Diagnosis not present

## 2022-02-06 DIAGNOSIS — E860 Dehydration: Secondary | ICD-10-CM | POA: Diagnosis not present

## 2022-02-06 LAB — COMPREHENSIVE METABOLIC PANEL
ALBUMIN: 3.6 g/dL (ref 3.4–5.0)
ALKALINE PHOSPHATASE: 43 U/L — ABNORMAL LOW (ref 46–116)
ALT (SGPT): 10 U/L (ref 10–49)
ANION GAP: 8 mmol/L (ref 5–14)
AST (SGOT): 15 U/L (ref <=34)
BILIRUBIN TOTAL: 0.3 mg/dL (ref 0.3–1.2)
BLOOD UREA NITROGEN: 15 mg/dL (ref 9–23)
BUN / CREAT RATIO: 31
CALCIUM: 9.2 mg/dL (ref 8.7–10.4)
CHLORIDE: 108 mmol/L — ABNORMAL HIGH (ref 98–107)
CO2: 25.3 mmol/L (ref 20.0–31.0)
CREATININE: 0.48 mg/dL — ABNORMAL LOW
EGFR CKD-EPI (2021) FEMALE: >90 mL/min/{1.73_m2} (ref >=60)
GLUCOSE RANDOM: 99 mg/dL (ref 70–179)
POTASSIUM: 3.9 mmol/L (ref 3.4–4.8)
PROTEIN TOTAL: 6.5 g/dL (ref 5.7–8.2)
SODIUM: 141 mmol/L (ref 135–145)

## 2022-02-06 LAB — CBC W/ AUTO DIFF
BASOPHILS ABSOLUTE COUNT: 0 10*9/L (ref 0.0–0.1)
BASOPHILS RELATIVE PERCENT: 0.7 %
EOSINOPHILS ABSOLUTE COUNT: 0.2 10*9/L (ref 0.0–0.5)
EOSINOPHILS RELATIVE PERCENT: 2.9 %
HEMATOCRIT: 28.8 % — ABNORMAL LOW (ref 34.0–44.0)
HEMOGLOBIN: 9.5 g/dL — ABNORMAL LOW (ref 11.3–14.9)
LYMPHOCYTES ABSOLUTE COUNT: 1.7 10*9/L (ref 1.1–3.6)
LYMPHOCYTES RELATIVE PERCENT: 33.1 %
MEAN CORPUSCULAR HEMOGLOBIN CONC: 33.2 g/dL (ref 32.0–36.0)
MEAN CORPUSCULAR HEMOGLOBIN: 30.5 pg (ref 25.9–32.4)
MEAN CORPUSCULAR VOLUME: 91.9 fL (ref 77.6–95.7)
MEAN PLATELET VOLUME: 9.8 fL (ref 6.8–10.7)
MONOCYTES ABSOLUTE COUNT: 0.5 10*9/L (ref 0.3–0.8)
MONOCYTES RELATIVE PERCENT: 8.7 %
NEUTROPHILS ABSOLUTE COUNT: 2.9 10*9/L (ref 1.8–7.8)
NEUTROPHILS RELATIVE PERCENT: 54.6 %
PLATELET COUNT: 186 10*9/L (ref 150–450)
RED BLOOD CELL COUNT: 3.13 10*12/L — ABNORMAL LOW (ref 3.95–5.13)
RED CELL DISTRIBUTION WIDTH: 15.5 % — ABNORMAL HIGH (ref 12.2–15.2)
WBC ADJUSTED: 5.2 10*9/L (ref 3.6–11.2)

## 2022-02-06 LAB — IRON PANEL
IRON SATURATION: 13 % — ABNORMAL LOW (ref 20–55)
IRON: 38 ug/dL — ABNORMAL LOW
TOTAL IRON BINDING CAPACITY: 285 ug/dL (ref 250–425)

## 2022-02-06 LAB — FERRITIN: FERRITIN: 161.7 ng/mL

## 2022-02-06 LAB — VITAMIN B12: VITAMIN B-12: 601 pg/mL (ref 211–911)

## 2022-02-06 LAB — FOLATE: FOLATE: 19 ng/mL (ref >=5.4)

## 2022-02-06 NOTE — Unmapped (Addendum)
Cefdinir reduce to 1 tablet daily    Everything else unchanged.    Plan to decrease TPN to 5 days after 02/26/2022, we have appointment at 8.30 am. Video visit.

## 2022-02-06 NOTE — Unmapped (Unsigned)
[facility]  CONSULT NOTE - INFLAMMATORY BOWEL DISEASE  02/06/2022    Demographics:  [name] [name] is a 21 y.o. year old female    Referring physician:   Jarold Motto, PAC  [address]  [place],  [place] [address]    Patient Care Team:  Jarold Motto, [facility] as PCP - General  Debbe Mounts, MD as Consulting Physician (Peds: Pediatric Hematology-Oncology)  Cherlynn Perches, MD as Consulting Physician (Orthopedic Surgery)  Jobe Gibbon, MD as Consulting Physician (Interventional Radiology)  Twana First, MD as Consulting Physician  Tatton-Howard, Clelia Croft, RN as Registered Nurse (Oncology Navigator)  Lenise Arena Judye Bos, MD as Attending Provider (Surgical Oncology)  Clarene Duke, MD as Consulting Physician (Plastic Surgery)  Helyn Numbers, MD as Consulting Physician (Gastroenterology)  Childers, Biagio Quint, MD as Consulting Physician (Palliative Medicine)  Belva Bertin, RN as Registered Nurse (Palliative Medicine)  Lajuana Matte, RN as Registered Nurse (Oncology Navigator)  Sheral Apley, MD (Medical Oncology)  Lajuana Matte, RN as Registered Nurse (Oncology Navigator)  [name], MD as Fellow (Psychiatry)           HPI / NOTE :     Today, I saw [name] [name] for follow-up consultation in the [facility] at the request of Dr. Elenore Rota regarding pouch problems and rectal bleeding.  Outside records including available clinical notes, endoscopy reports, imaging results and pathology results were reviewed in detail as part of this follow-up consultation.       Chief complaint: Follow-up    HPI:  4-5  bm. Sometimes still blood.         Additionally she is afraid of eating since every time she eats she develops abdominal pain.  She is not able to tolerate higher rates of her tube feeds than 50 mL/h and reports reflux during the night.    Pain therapy: on gabapentin 200 mg in the night, on pain management.    Anxiety/nausea : olanzapine (zyprexa) 7.5 mg , mirtazepin 7.5 mg    Desmoid therapy: On sorafenib    Nutrition:       Review of Systems: positive for [name]  Otherwise, the balance of 10 systems is negative.            Past Medical History:   Past medical history:   Past Medical History:   Diagnosis Date    Abdominal pain     Acid reflux     occas    Anesthesia complication     itching, shaking, coldness; last few surgeries have gone much better    Cancer (CMS-HCC)     Cataract of right eye     COVID-19 virus infection 01/2019    Cyst of thyroid determined by ultrasound     monitoring    [name]     2 right forearm, 1 left thigh, 1 right scapula, 1 under left clavicle; multiple    Difficult intravenous access     [name] (familial adenomatous polyposis)     [name]     Gastric polyps     History of chemotherapy     last treatment approx 05/2019    History of colon polyps     History of COVID-19 01/2019    Iron deficiency anemia due to chronic blood loss     received iron infusion 11-2019    PONV (postoperative nausea and vomiting)     Rectal bleeding  Syncopal episodes     especially if becoming dehydrated     Past surgical history:   Past Surgical History:   Procedure Laterality Date    cyroablation      cystis removal      desmoid removal      PR CLOSE ENTEROSTOMY,RESEC+ANAST N/A 10/09/2020    Procedure: ILEOSTOMY TAKEDOWN;  Surgeon: Mickle Asper, MD;  Location: OR [place];  Service: General Surgery    PR COLONOSCOPY W/BIOPSY SINGLE/MULTIPLE N/A 10/27/2012    Procedure: COLONOSCOPY, FLEXIBLE, PROXIMAL TO SPLENIC FLEXURE; WITH BIOPSY, SINGLE OR MULTIPLE;  Surgeon: Shirlyn Goltz Mir, MD;  Location: PEDS PROCEDURE ROOM [facility];  Service: Gastroenterology    PR COLONOSCOPY W/BIOPSY SINGLE/MULTIPLE N/A 09/14/2013    Procedure: COLONOSCOPY, FLEXIBLE, PROXIMAL TO SPLENIC FLEXURE; WITH BIOPSY, SINGLE OR MULTIPLE;  Surgeon: Shirlyn Goltz Mir, MD;  Location: PEDS PROCEDURE ROOM [place];  Service: Gastroenterology    PR COLONOSCOPY W/BIOPSY SINGLE/MULTIPLE N/A 11/08/2014    Procedure: COLONOSCOPY, FLEXIBLE, PROXIMAL TO SPLENIC FLEXURE; WITH BIOPSY, SINGLE OR MULTIPLE;  Surgeon: Arnold Long Mir, MD;  Location: PEDS PROCEDURE ROOM [facility];  Service: Gastroenterology    PR COLONOSCOPY W/BIOPSY SINGLE/MULTIPLE N/A 12/26/2015    Procedure: COLONOSCOPY, FLEXIBLE, PROXIMAL TO SPLENIC FLEXURE; WITH BIOPSY, SINGLE OR MULTIPLE;  Surgeon: Arnold Long Mir, MD;  Location: PEDS PROCEDURE ROOM [facility];  Service: Gastroenterology    PR COLONOSCOPY W/BIOPSY SINGLE/MULTIPLE N/A 09/02/2017    Procedure: COLONOSCOPY, FLEXIBLE, PROXIMAL TO SPLENIC FLEXURE; WITH BIOPSY, SINGLE OR MULTIPLE;  Surgeon: Arnold Long Mir, MD;  Location: PEDS PROCEDURE ROOM [place];  Service: Gastroenterology    PR COLSC FLX W/REMOVAL LESION BY HOT BX FORCEPS N/A 08/27/2016    Procedure: COLONOSCOPY, FLEXIBLE, PROXIMAL TO SPLENIC FLEXURE; W/REMOVAL TUMOR/POLYP/OTHER LESION, HOT BX FORCEP/CAUTE;  Surgeon: Arnold Long Mir, MD;  Location: PEDS PROCEDURE ROOM [facility];  Service: Gastroenterology    PR COLSC FLX W/RMVL OF TUMOR POLYP LESION SNARE TQ N/A 02/25/2019    Procedure: COLONOSCOPY FLEX; W/REMOV TUMOR/LES BY SNARE;  Surgeon: Helyn Numbers, MD;  Location: GI PROCEDURES [facility] [facility];  Service: Gastroenterology    PR COLSC FLX W/RMVL OF TUMOR POLYP LESION SNARE TQ N/A 03/13/2020    Procedure: COLONOSCOPY FLEX; W/REMOV TUMOR/LES BY SNARE;  Surgeon: Helyn Numbers, MD;  Location: [facility] PROCEDURES [facility];  Service: Gastroenterology    PR EXC SKIN BENIG 2.1-3 CM TRUNK,ARM,LEG Right 02/25/2017    Procedure: EXCISION, BENIGN LESION INCLUDE MARGINS, EXCEPT SKIN TAG, LEGS; EXCISED DIAMETER 2.1 TO 3.0 CM;  Surgeon: Clarene Duke, MD;  Location: CHILDRENS OR [facility];  Service: Plastics    PR EXC SKIN BENIG 3.1-4 CM TRUNK,ARM,LEG Right 02/25/2017    Procedure: EXCISION, BENIGN LESION INCLUDE MARGINS, EXCEPT SKIN TAG, ARMS; EXCISED DIAMETER 3.1 TO 4.0 CM;  Surgeon: Clarene Duke, MD;  Location: CHILDRENS OR [facility];  Service: Plastics    PR EXC SKIN BENIG >4 CM FACE,FACIAL Right 02/25/2017    Procedure: EXCISION, OTHER BENIGN LES INCLUD MARGIN, FACE/EARS/EYELIDS/NOSE/LIPS/MUCOUS MEMBRANE; EXCISED DIAM >4.0 CM;  Surgeon: Clarene Duke, MD;  Location: CHILDRENS OR [facility];  Service: Plastics    PR EXC TUMOR SOFT TISSUE LEG/ANKLE SUBQ 3+CM Right 08/05/2019    Procedure: EXCISION, TUMOR, SOFT TISSUE OF LEG OR ANKLE AREA, SUBCUTANEOUS; 3 CM OR GREATER;  Surgeon: Arsenio Katz, MD;  Location: MAIN OR [place];  Service: Plastics    PR EXC TUMOR SOFT TISSUE LEG/ANKLE SUBQ <3CM Right 08/05/2019    Procedure: EXCISION, TUMOR, SOFT TISSUE OF LEG OR ANKLE AREA, SUBCUTANEOUS; LESS  THAN 3 CM;  Surgeon: Arsenio Katz, MD;  Location: MAIN OR [facility];  Service: Plastics    PR LAP, SURG PROCTECTOMY W J-POUCH N/A 08/10/2020    Procedure: ROBOTIC ASSISTED LAPAROSCOPIC PROCTOCOLECTOMY, ILEAL J POUCH, WITH OSTOMY;  Surgeon: Mickle Asper, MD;  Location: OR [place];  Service: General Surgery    PR NDSC EVAL INTSTINAL POUCH DX W/COLLJ SPEC SPX N/A 01/23/2021    Procedure: ENDO EVAL SM INTEST POUCH; DX;  Surgeon: Modena Nunnery, MD;  Location: [facility] PROCEDURES [facility] [facility];  Service: Gastroenterology    PR NDSC EVAL INTSTINAL POUCH DX W/COLLJ SPEC SPX N/A 08/27/2021    Procedure: ENDO EVAL SM INTEST POUCH; DX;  Surgeon: Hunt Oris, MD;  [facility]: [facility];  Service: Gastroenterology    PR NDSC EVAL INTSTINAL POUCH DX W/COLLJ SPEC SPX N/A 12/09/2021    Procedure: ENDO EVAL SM INTEST POUCH; DX;  Surgeon: Vidal Schwalbe, MD;  [facility]: [facility] PROC[facility]URES [facility];  Service: Gastroenterology    PR NDSC EVAL INTSTINAL POUCH W/BX SINGLE/MULTIPLE N/A 01/20/2022    Procedure: ENDOSCOPIC EVAL OF SMALL INTESTINAL POUCH; DIAGNOSTIC, No biopsies;  Surgeon: Andrey Farmer, MD;  Location: [facility] PROC[facility]URES [facility];  Service: Gastroenterology PR UNLISTED PROCEDURE SMALL INTESTINE  01/23/2021    Procedure: UNLISTED PROCEDURE, SMALL INTESTINE;  Surgeon: Modena Nunnery, MD;  Location: [facility] PROC[facility] [facility];  Service: Gastroenterology    PR UPPER GI ENDOSCOPY,BIOPSY N/A 10/27/2012    Procedure: UGI ENDOSCOPY; WITH BIOPSY, SINGLE OR MULTIPLE;  Surgeon: Shirlyn Goltz Mir, MD;  Location: PEDS PROCEDURE ROOM [facility];  Service: Gastroenterology    PR UPPER GI ENDOSCOPY,BIOPSY N/A 09/14/2013    Procedure: UGI ENDOSCOPY; WITH BIOPSY, SINGLE OR MULTIPLE;  Surgeon: Shirlyn Goltz Mir, MD;  Location: PEDS PROCEDURE ROOM [facility];  Service: Gastroenterology    PR UPPER GI ENDOSCOPY,BIOPSY N/A 11/08/2014    Procedure: UGI ENDOSCOPY; WITH BIOPSY, SINGLE OR MULTIPLE;  Surgeon: Arnold Long Mir, MD;  Location: PEDS PROCEDURE ROOM [facility];  Service: Gastroenterology    PR UPPER GI ENDOSCOPY,BIOPSY N/A 12/26/2015    Procedure: UGI ENDOSCOPY; WITH BIOPSY, SINGLE OR MULTIPLE;  Surgeon: Arnold Long Mir, MD;  Location: PEDS PROCEDURE ROOM [facility];  Service: Gastroenterology    PR UPPER GI ENDOSCOPY,BIOPSY N/A 08/27/2016    Procedure: UGI ENDOSCOPY; WITH BIOPSY, SINGLE OR MULTIPLE;  Surgeon: Arnold Long Mir, MD;  Location: PEDS PROCEDURE ROOM [facility];  Service: Gastroenterology    PR UPPER GI ENDOSCOPY,BIOPSY N/A 09/02/2017    Procedure: UGI ENDOSCOPY; WITH BIOPSY, SINGLE OR MULTIPLE;  Surgeon: Arnold Long Mir, MD;  Location: PEDS PROCEDURE ROOM [facility];  Service: Gastroenterology    PR UPPER GI ENDOSCOPY,BIOPSY N/A 03/13/2020    Procedure: UGI ENDOSCOPY; WITH BIOPSY, SINGLE OR MULTIPLE;  Surgeon: Helyn Numbers, MD;  Location: [facility] [facility];  Service: Gastroenterology    PR UPPER GI ENDOSCOPY,BIOPSY N/A 09/05/2021    Procedure: UGI ENDOSCOPY; WITH BIOPSY, SINGLE OR MULTIPLE;  Surgeon: Wendall Papa, MD;  Location: [facility]URES [facility];  Service: Gastroenterology    PR UPPER GI ENDOSCOPY,DIAGNOSIS N/A 01/20/2022    Procedure: UGI ENDO, INCLUDE ESOPHAGUS, STOMACH, & DUODENUM &/OR JEJUNUM; DX W/WO COLLECTION SPECIMN, BY BRUSH OR WASH;  Surgeon: Andrey Farmer, MD;  [facility]: [facility]URES [facility];  Service: Gastroenterology    TUMOR REMOVAL      multiple-head, neck, back, hand, right flank, multiple     Family history:   Family History   Problem Relation Age of Onset  No Known Problems Mother     No Known Problems Father     No Known Problems Sister     No Known Problems Brother     Stroke Maternal Grandmother     Other Maternal Grandmother         benign lesions of liver and pancreas, further details unknown    Cancer Maternal Grandmother     Diabetes Maternal Grandmother     Hypertension Maternal Grandmother     Thyroid disease Maternal Grandmother     Arthritis Maternal Grandfather     Asthma Maternal Grandfather     COPD Paternal Grandmother         Deceased    Miscarriages / Stillbirths Paternal Grandmother     Alcohol abuse Paternal Grandfather         Deceased    No Known Problems Maternal Aunt     No Known Problems Maternal Uncle     No Known Problems Paternal Aunt     No Known Problems Paternal Uncle     Anesthesia problems Neg Hx     Broken bones Neg Hx     Cancer Neg Hx     Clotting disorder Neg Hx     Collagen disease Neg Hx     Diabetes Neg Hx     Dislocations Neg Hx     Fibromyalgia Neg Hx     Gout Neg Hx     Hemophilia Neg Hx     Osteoporosis Neg Hx     Rheumatologic disease Neg Hx     Scoliosis Neg Hx     Severe sprains Neg Hx     Sickle cell anemia Neg Hx     Spinal Compression Fracture Neg Hx     Melanoma Neg Hx     Basal cell carcinoma Neg Hx     Squamous cell carcinoma Neg Hx      Social history:   Social History     Socioeconomic History    Marital status: Single     Spouse name: None    Number of children: None    Years of education: None    Highest education level: None   Tobacco Use    Smoking status: Never     Passive exposure: Past    Smokeless tobacco: Never   Vaping Use    Vaping Use: Never used   Substance and Sexual Activity Vaping Use: Never used   Substance and Sexual Activity    Alcohol use: Never    Drug use: Never    Sexual activity: Never   Other Topics Concern    Do you use sunscreen? Yes    Tanning bed use? No    Are you easily burned? No    Excessive sun exposure? No    Blistering sunburns? 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     Social Determinants of Health     Financial Resource Strain: Low Risk  (08/30/2021)    Overall Financial Resource Strain (CARDIA)     Difficulty of Paying Living Expenses: Not very hard   Food Insecurity: No Food Insecurity (08/30/2021)    Hunger Vital Sign     Worried About Running Out of Food in the Last Year: Never true     Ran Out of Food in the Last Year: Never true   Transportation Needs: No Transportation Needs (  08/30/2021)    PRAPARE - Therapist, art (Medical): No     Lack of Transportation (Non-Medical): No             Allergies:     Allergies   Allergen Reactions    Adhesive Tape-Silicones Hives and Rash     Paper tape  And tegederm ok    Ferrlecit [Sodium Ferric Gluconat-Sucrose] Swelling and Rash    Levofloxacin Swelling and Rash     Swelling in mouth, rash,     Methylnaltrexone      Per Patient: I lost bowel control, severe abdominal cramping, and elevated BP    Neomycin Swelling     Rxn after ear drops; ear swelling    Papaya Hives    Morphine Nausea And Vomiting    Zosyn [Piperacillin-Tazobactam] Itching and Rash     Red and itchy    Compazine [Prochlorperazine] Other (See Comments)     Extreme agitation    Latex, Natural Rubber Rash             Medications:     Current Outpatient Medications   Medication Sig Dispense Refill    acetaminophen (TYLENOL) 500 MG tablet Take 2 tablets (1,000 mg total) by mouth two (2) times a day.      apixaban (ELIQUIS) 2.5 mg Tab Take 1 tablet (2.5 mg total) by mouth two (2) times a day. 60 tablet 0    baclofen (LIORESAL) 5 mg Tab times a day as needed for muscle spasms. 40 tablet 0    buprenorphine 10 mcg/hour PTWK transdermal patch Place 1 patch on the skin every seven (7) days. 4 patch 2    cefdinir (OMNICEF) 300 MG capsule Take 1 capsule (300 mg total) by mouth Two (2) times a day. 180 capsule 1    clobetasoL (TEMOVATE) 0.05 % ointment Apply topically two (2) times a day. To stubborn/thick rashes on hands/feet ears until clear/smooth. Restart as needed 60 g 3    COURIERED MED OR SUPPLY [id number], SORAFENIB 200MG  TABLET 1 each 0    diclofenac sodium (VOLTAREN) 1 % gel Apply 2 g topically four (4) times a day. 100 g 0    diphenhydrAMINE (BENADRYL) 50 mg capsule Take 1 capsule (50 mg total) by mouth every six (6) hours as needed for itching (per patient preference (if prefers over hydroxyzine)). 30 each 0    famotidine (PEPCID) 20 MG tablet Take 1 tablet (20 mg total) by mouth nightly. 90 tablet 3    gabapentin (NEURONTIN) 100 MG capsule Take 2 capsules (200 mg total) by mouth nightly. 60 capsule 0    hydrocortisone (ANUSOL-HC) 25 mg suppository Insert 1 suppository (25 mg total) into the rectum two (2) times a day as needed for hemorrhoids (possible rectal/anal bleeding source). 30 suppository 0    hydrocortisone 2.5 % cream Apply 1 application. topically daily as needed.      hydrOXYzine (ATARAX) 50 MG tablet Take 1 tablet (50 mg total) by mouth every six (6) hours as needed for itching or anxiety (also insomnia). 30 tablet 0    hyoscyamine (LEVSIN) 0.125 mg tablet Take 1 tablet (0.125 mg total) by mouth every four (4) hours as needed for cramping.      lidocaine (LIDODERM) 5 % patch Place 1 patch on the skin daily. Apply to affected area for 12 hours only each day (then remove patch)      menthol-zinc oxide (CALMOSEPTINE) 0.44-20.6 % Oint Apply topically four (4)  times a day as needed. 113 g 11    mirtazapine (REMERON) 7.5 MG tablet Take 1 tablet (7.5 mg total) by mouth nightly. 90 tablet 3    naloxone (NARCAN) 4 mg nasal spray One spray in either nostril once for known/suspected opioid overdose. May repeat every 2-3 minutes in alternating nostril til EMS arrives 2 each 0    NIFEdipine 0.3% lidocaine 1.5% in petrolatum ointment Apply topically two (2) times a day. 30 g 0    OLANZapine (ZYPREXA) 7.5 MG tablet Take 1 tablet (7.5 mg total) by mouth nightly. 30 tablet 2    ondansetron (ZOFRAN-ODT) 8 MG disintegrating tablet Take 1 tablet (8 mg total) by mouth every twelve (12) hours as needed for nausea.      oxyCODONE (ROXICODONE) 10 mg immediate release tablet Take 1 tablet (10 mg total) by mouth every four (4) hours as needed for pain. Fill on or after: 03/01/22 100 tablet 0    pimecrolimus (ELIDEL) 1 % cream Apply topically two (2) times a day. To face as needed for rash 30 g 11    silver sulfaDIAZINE (SILVADENE) 1 % cream Apply topically daily. To burn on leg 50 g 11    SLEEPY BUTTER Swish and spit 10 mL 4 times a day as needed. 300 mL 3    SORAfenib (NEXAVAR) 200 mg tablet Take 1 tablet (200 mg total) by mouth daily. 60 tablet 2    triamcinolone (KENALOG) 0.1 % cream Apply topically two (2) times a day. 28 g 3    zinc oxide 10 % Crea Apply 1 application. topically daily as needed.       No current facility-administered medications for this visit.             Physical Exam:   Ht 165.1 cm (5' 5)  - Wt 55.8 kg (123 lb)  - LMP 01/15/2022 (Exact Date)  - BMI 20.47 kg/m??       BP Readings from Last 3 Encounters:   02/06/22 115/75   01/29/22 108/70   01/28/22 115/57      Wt Readings from Last 12 Encounters:   02/06/22 55.8 kg (123 lb)   01/29/22 57.4 kg (126 lb 9.6 oz)   01/28/22 53.8 kg (118 lb 8 oz)   01/20/22 51.3 kg (113 lb)   01/02/22 52.3 kg (115 lb 6.4 oz)   12/31/21 52.2 kg (115 lb 1.6 oz)   12/26/21 52.2 kg (115 lb)   12/19/21 50.7 kg (111 lb 12.8 oz)   12/03/21 51.6 kg (113 lb 12.8 oz)   10/23/21 52.3 kg (115 lb 6.4 oz)   10/02/21 53.1 kg (117 lb)   10/02/21 53.3 kg (117 lb 9.6 oz)      BMI: Estimated body mass index is 20.47 kg/m?? as calculated from the following:    Height as of this encounter: 165.1 cm (5' 5).    Weight as of this encounter: 55.8 kg (123 lb).    BSA: Estimated body surface area is 1.6 meters squared as calculated from the following:    Height as of this encounter: 165.1 cm (5' 5).    Weight as of this encounter: 55.8 kg (123 lb).  GEN: no apparent distress, appears comfortable on exam  HEENT: mucous membranes moist, facial plethora; negative Pemberton's Sign   NEURO:  gait normal, non-focal, no obvious neurologic abnormality  NECK: Supple, no lymphadenopathy  LUNGS: CTAB, no wheezes, rales, or rhonchi  CV: S1/S2, RRR, no murmurs  ABD: Soft, palpable  bulging in LLQ, mildly tender  Extremities: no cyanosis, clubbing or edema, normal gait  Psych: affect appropriate, A&O x3  SKIN: no visible lesions on face, neck, arms, abdomen  PERIANAL / RECTAL EXAM:   Deferred           Labs, Data & Indices:     Lab Review:   No visits with results within 1 Week(s) from this visit.   Latest known visit with results is:   Lab on 01/28/2022   Component Date Value Ref Range Status    Phosphorus 01/28/2022 2.4  2.4 - 5.1 mg/dL Final    Magnesium 81/19/1478 2.1  1.6 - 2.6 mg/dL Final    Free T4 29/56/2130 1.17  0.89 - 1.76 ng/dL Final    TSH 86/57/8469 0.887  0.550 - 4.780 uIU/mL Final    Sodium 01/28/2022 142  135 - 145 mmol/L Final    Potassium 01/28/2022 4.1  3.4 - 4.8 mmol/L Final    Chloride 01/28/2022 110 (H)  98 - 107 mmol/L Final    CO2 01/28/2022 26.0  20.0 - 31.0 mmol/L Final    Anion Gap 01/28/2022 6  5 - 14 mmol/L Final    BUN 01/28/2022 13  9 - 23 mg/dL Final    Creatinine 62/95/2841 0.36 (L)  0.55 - 1.02 mg/dL Final    BUN/Creatinine Ratio 01/28/2022 36   Final    eGFR CKD-EPI (2021) Female 01/28/2022 >90  >=60 mL/min/1.89m2 Final    Glucose 01/28/2022 113  70 - 179 mg/dL Final    Calcium 32/44/0102 8.7  8.7 - 10.4 mg/dL Final    Albumin 72/53/6644 3.4  3.4 - 5.0 g/dL Final    Total Protein 01/28/2022 5.9  5.7 - 8.2 g/dL Final Total Bilirubin 03/47/4259 0.3  0.3 - 1.2 mg/dL Final    AST 56/38/7564 16  <=34 U/L Final    ALT 01/28/2022 13  10 - 49 U/L Final    Alkaline Phosphatase 01/28/2022 41 (L)  46 - 116 U/L Final    WBC 01/28/2022 5.1  3.6 - 11.2 10*9/L Final    RBC 01/28/2022 2.64 (L)  3.95 - 5.13 10*12/L Final    HGB 01/28/2022 8.1 (L)  11.3 - 14.9 g/dL Final    HCT 33/29/5188 24.5 (L)  34.0 - 44.0 % Final    MCV 01/28/2022 92.9  77.6 - 95.7 fL Final    MCH 01/28/2022 30.8  25.9 - 32.4 pg Final    MCHC 01/28/2022 33.1  32.0 - 36.0 g/dL Final    RDW 41/66/0630 17.2 (H)  12.2 - 15.2 % Final    MPV 01/28/2022 10.0  6.8 - 10.7 fL Final    Platelet 01/28/2022 181  150 - 450 10*9/L Final    Neutrophils % 01/28/2022 62.1  % Final    Lymphocytes % 01/28/2022 23.4  % Final    Monocytes % 01/28/2022 12.5  % Final    Eosinophils % 01/28/2022 1.3  % Final    Basophils % 01/28/2022 0.7  % Final    Absolute Neutrophils 01/28/2022 3.2  1.8 - 7.8 10*9/L Final    Absolute Lymphocytes 01/28/2022 1.2  1.1 - 3.6 10*9/L Final    Absolute Monocytes 01/28/2022 0.6  0.3 - 0.8 10*9/L Final    Absolute Eosinophils 01/28/2022 0.1  0.0 - 0.5 10*9/L Final    Absolute Basophils 01/28/2022 0.0  0.0 - 0.1 10*9/L Final    Anisocytosis 01/28/2022 Slight (A)  Not Present Final     ............................................................................................................................................Marland Kitchen  HISTORY:     Brief Disease Course:  08/2020 colectomy for [name] 8/022 ileostomy takedown, complicated by postop ileus and inabailitry to eat during stage 1 and 2 (need for TPN)    Other Paraspinal and anterior chest desmoid tumors    Colectomy specimen:  Terminal ileum, appendix and colon, total colectomy:  -Tubular adenomas (at least 100), extending from the ileocecal valve to the rectum, with the majority located in the rectum. The adenomas range from 0.2 cm to 0.7 cm in greatest dimension.  -There is no evidence of high-grade dysplasia or malignancy.  -Mucosal margins are negative for dysplasia (5.2 cm from proximal, 0.1 cm from distal).  -The appendix and ileum are not involved.      Endoscopy:      Upper endoscopy 08/2021     Normal esophagus.                         - Multiple gastric polyps. Biopsied.                         - Normal stomach otherwise. Biopsied.                         - Normal examined duodenum. Biopsied.    A: Duodenum, biopsy  - Adenomatous polyp (1 fragment)  - No high grade dysplasia or carcinoma identified  - Separate fragments of duodenal mucosa with mild-moderate villous blunting  - No increased intraepithelial lymphocytes identified     B: Stomach, biopsy  - Gastric fundic mucosa with chronic superficial gastritis  - Gastric antral mucosa with erosive minimally active chronic superficial gastritis and slight reactive foveolar hyperplasia  - No Helicobacter pylori identified on H&E stain  - No intestinal metaplasia or dysplasia identified     C: Stomach, polypectomy  - Polypoid fragments of gastric fundic mucosa with features suggestive of early fundic gland polyps  - No intestinal metaplasia or dysplasia identified      Pouchoscopy 08/2021   The perianal and digital rectal examinations were normal.       The j-pouch, pouch inlet and rectal cuff appeared normal.       The exam was otherwise without abnormality, including no evidence of        mass of intussusception.                                                               Pouchoscopy 01/23/2021  Impression:            - Preparation of the colon was fair.                         - The examined portion of the ileum was normal.                         - The ileoanal pouch is normal.                         - Rectal cuff with congestion and an intact staple  line seen.                         - No specimens collected.    - Hegar dilation 17 - 20 mm    Last upper and lower endoscopy 03/2020  Upper    - Normal esophagus.                         - Multiple gastric polyps. Fundic gland polyps                         - Normal mucosa was found in the antrum. Biopsied.                         - Normal examined duodenum    Colon  - Six 6 to 10 mm polyps in the rectum, in the sigmoid                          colon, in the descending colon and in the transverse                          colon, removed with a cold snare. Resected and                          retrieved.                         - 300 polyps in the entire colon.    A: Stomach, biopsy  - Gastric fundic mucosa with chronic superficial gastritis  - Gastric antral mucosa with chronic superficial gastritis and reactive foveolar hyperplasia  - No Helicobacter pylori identified on H&E stain  - No intestinal metaplasia or dysplasia identified     B: Colon, transverse, biopsy  - Adenomatous polyp (multiple fragments)  - No high grade dysplasia or carcinoma identified    Imaging:     US Liver doppler 04/23/2021   IMPRESSION:   1.Patent hepatic vasculature with normal flow direction.   2.Mildly echogenic liver, which is nonspecific, but may be seen in fibrofatty changes.       Soluble contrast enema 09/19/2020     Mild narrowing at the ileal pouch-anal anastomosis which likely represents mild post-operative edema. Small linear focus of contrast at the level of the anastomosis is favored to be intraluminal over a tiny contained leak given that it spontaneously resolves.     CT abdomen and pelvis August 26, 2020     1. Unremarkable appearance to the small bowel. J pouch is unremarkable. Diverting loop ileostomy is unremarkable.      CT abdomen and pelvis August 18, 2020     IMPRESSION:  Dilated loops of ileum distal to the loop ileostomy with associated wall thickening.  Findings are suggestive of nonspecific ileitis, infectious or inflammatory.  No drainable fluid collections.  No evidence of obstruction    CT abdomen and pelvis w contrast 02/15/2021    IMPRESSION:   Prior colectomy. Mid to distal small bowel loops are prominent with   air-fluid levels. Difficult to completely exclude distal partial   small bowel obstruction.     Small amount of free fluid in the abdomen and pelvis.       CT abdomen and pelvis with  contrast 02/22/2021  IMPRESSION:   There is no evidence of intestinal obstruction or pneumoperitoneum.   There is no hydronephrosis.     Previous subtotal colectomy. Dilation of distal ileum seen in the   previous examination could not be identified in the current study.   Oral contrast has reached the rectum.     Possible small hepatic cyst. There is 10 mm smooth marginated   low-density lesion in the left kidney with density measurements   higher than usual for simple cyst. Follow-up renal sonogram in the   outpatient setting should be considered to re-evaluate this finding.               Assessment & Recommendations:     [name] [name] is a 21 y.o. female who presents for follow up for GI issues after pouch done for [name].       Abdominal pain: This probably due to combination of dysmotility, visceral hypersensitivity and anxiety.  The pain appears to be out of proportion to the objective findings.  However it is possible that the desmoid tumors compress mesenteric nerves leading to the intense pain. A  celiac block was discussed in the past and I think given the limited therapeutic options and the high dose of narcotics the patient is currently on it is worth a trial to perform this block. Additionally the high doses of narcotics lead to more dysmotility and we need to consider Mov[employer] in the future    Anxiety/nausea : Anxiety probably drives and nausea.  Currently it seems to be better controlled with the regimen of olanzapine (zyprexa), mirtazepin 7.5 mg,  and atarax     Desmoid therapy: Shrink the tumors she was just started on on sorafenib    Nutrition: On TPN      Fecal incontinence: Currently no complaint however given the high bowel frequency every restart the patient on cefdinir      Portions of this record have been created using [employer]. Dictation errors have been sought, but may not have been identified and corrected.     Decision-making today was high complexity on the basis of discussion of [name]/desmoid tumors/pouchitis, chronic abdominal pain, and reviewing imaging/colonoscopy with future next steps.          PLAN:            There are no Patient Instructions on file for this visit.

## 2022-02-07 DIAGNOSIS — K5900 Constipation, unspecified: Secondary | ICD-10-CM | POA: Diagnosis not present

## 2022-02-07 DIAGNOSIS — D1391 Familial adenomatous polyposis: Secondary | ICD-10-CM | POA: Diagnosis not present

## 2022-02-07 DIAGNOSIS — F411 Generalized anxiety disorder: Secondary | ICD-10-CM | POA: Diagnosis not present

## 2022-02-07 DIAGNOSIS — E860 Dehydration: Secondary | ICD-10-CM | POA: Diagnosis not present

## 2022-02-07 DIAGNOSIS — Z79891 Long term (current) use of opiate analgesic: Secondary | ICD-10-CM | POA: Diagnosis not present

## 2022-02-07 DIAGNOSIS — M728 Other fibroblastic disorders: Secondary | ICD-10-CM | POA: Diagnosis not present

## 2022-02-07 DIAGNOSIS — D692 Other nonthrombocytopenic purpura: Secondary | ICD-10-CM | POA: Diagnosis not present

## 2022-02-07 DIAGNOSIS — R1084 Generalized abdominal pain: Secondary | ICD-10-CM | POA: Diagnosis not present

## 2022-02-07 DIAGNOSIS — K219 Gastro-esophageal reflux disease without esophagitis: Secondary | ICD-10-CM | POA: Diagnosis not present

## 2022-02-07 DIAGNOSIS — R112 Nausea with vomiting, unspecified: Secondary | ICD-10-CM | POA: Diagnosis not present

## 2022-02-07 DIAGNOSIS — Z8616 Personal history of COVID-19: Secondary | ICD-10-CM | POA: Diagnosis not present

## 2022-02-07 DIAGNOSIS — D500 Iron deficiency anemia secondary to blood loss (chronic): Secondary | ICD-10-CM | POA: Diagnosis not present

## 2022-02-07 DIAGNOSIS — E43 Unspecified severe protein-calorie malnutrition: Secondary | ICD-10-CM | POA: Diagnosis not present

## 2022-02-07 DIAGNOSIS — M2180 Other specified acquired deformities of unspecified limb: Secondary | ICD-10-CM | POA: Diagnosis not present

## 2022-02-07 DIAGNOSIS — F410 Panic disorder [episodic paroxysmal anxiety] without agoraphobia: Secondary | ICD-10-CM | POA: Diagnosis not present

## 2022-02-07 DIAGNOSIS — Z932 Ileostomy status: Secondary | ICD-10-CM | POA: Diagnosis not present

## 2022-02-07 DIAGNOSIS — D48118 Desmoid tumor of other site: Secondary | ICD-10-CM | POA: Diagnosis not present

## 2022-02-08 DIAGNOSIS — E860 Dehydration: Secondary | ICD-10-CM | POA: Diagnosis not present

## 2022-02-08 DIAGNOSIS — Z932 Ileostomy status: Secondary | ICD-10-CM | POA: Diagnosis not present

## 2022-02-08 DIAGNOSIS — R112 Nausea with vomiting, unspecified: Secondary | ICD-10-CM | POA: Diagnosis not present

## 2022-02-08 DIAGNOSIS — R1084 Generalized abdominal pain: Secondary | ICD-10-CM | POA: Diagnosis not present

## 2022-02-09 DIAGNOSIS — E860 Dehydration: Secondary | ICD-10-CM | POA: Diagnosis not present

## 2022-02-09 DIAGNOSIS — R1084 Generalized abdominal pain: Secondary | ICD-10-CM | POA: Diagnosis not present

## 2022-02-09 DIAGNOSIS — Z932 Ileostomy status: Secondary | ICD-10-CM | POA: Diagnosis not present

## 2022-02-09 DIAGNOSIS — R112 Nausea with vomiting, unspecified: Secondary | ICD-10-CM | POA: Diagnosis not present

## 2022-02-10 DIAGNOSIS — E860 Dehydration: Secondary | ICD-10-CM | POA: Diagnosis not present

## 2022-02-10 DIAGNOSIS — R1084 Generalized abdominal pain: Secondary | ICD-10-CM | POA: Diagnosis not present

## 2022-02-10 DIAGNOSIS — Z932 Ileostomy status: Secondary | ICD-10-CM | POA: Diagnosis not present

## 2022-02-10 DIAGNOSIS — R112 Nausea with vomiting, unspecified: Secondary | ICD-10-CM | POA: Diagnosis not present

## 2022-02-10 MED FILL — SORAFENIB 200 MG TABLET: ORAL | 15 days supply | Qty: 15 | Fill #2

## 2022-02-11 DIAGNOSIS — R1084 Generalized abdominal pain: Secondary | ICD-10-CM | POA: Diagnosis not present

## 2022-02-11 DIAGNOSIS — E860 Dehydration: Secondary | ICD-10-CM | POA: Diagnosis not present

## 2022-02-11 DIAGNOSIS — Z932 Ileostomy status: Secondary | ICD-10-CM | POA: Diagnosis not present

## 2022-02-11 DIAGNOSIS — R112 Nausea with vomiting, unspecified: Secondary | ICD-10-CM | POA: Diagnosis not present

## 2022-02-12 ENCOUNTER — Ambulatory Visit
Admit: 2022-02-12 | Discharge: 2022-02-19 | Disposition: A | Discharge status: Home / Self Care | Payer: PRIVATE HEALTH INSURANCE | Source: Ambulatory Visit

## 2022-02-12 ENCOUNTER — Encounter: Admit: 2022-02-12 | Payer: PRIVATE HEALTH INSURANCE

## 2022-02-12 ENCOUNTER — Encounter: Admit: 2022-02-12 | Discharge: 2022-02-19 | Payer: PRIVATE HEALTH INSURANCE

## 2022-02-12 ENCOUNTER — Ambulatory Visit: Admit: 2022-02-12 | Payer: PRIVATE HEALTH INSURANCE

## 2022-02-12 DIAGNOSIS — K922 Gastrointestinal hemorrhage, unspecified: Secondary | ICD-10-CM | POA: Diagnosis not present

## 2022-02-12 DIAGNOSIS — K6389 Other specified diseases of intestine: Secondary | ICD-10-CM | POA: Diagnosis not present

## 2022-02-12 DIAGNOSIS — Z792 Long term (current) use of antibiotics: Secondary | ICD-10-CM | POA: Diagnosis not present

## 2022-02-12 DIAGNOSIS — R140 Abdominal distension (gaseous): Secondary | ICD-10-CM | POA: Diagnosis not present

## 2022-02-12 DIAGNOSIS — R1012 Left upper quadrant pain: Secondary | ICD-10-CM | POA: Diagnosis not present

## 2022-02-12 DIAGNOSIS — D509 Iron deficiency anemia, unspecified: Secondary | ICD-10-CM | POA: Diagnosis not present

## 2022-02-12 DIAGNOSIS — Z7901 Long term (current) use of anticoagulants: Secondary | ICD-10-CM | POA: Diagnosis not present

## 2022-02-12 DIAGNOSIS — Z681 Body mass index (BMI) 19 or less, adult: Secondary | ICD-10-CM | POA: Diagnosis not present

## 2022-02-12 DIAGNOSIS — I82719 Chronic embolism and thrombosis of superficial veins of unspecified upper extremity: Secondary | ICD-10-CM | POA: Diagnosis not present

## 2022-02-12 DIAGNOSIS — I8290 Acute embolism and thrombosis of unspecified vein: Secondary | ICD-10-CM | POA: Diagnosis not present

## 2022-02-12 DIAGNOSIS — D48119 Desmoid tumor of unspecified site: Secondary | ICD-10-CM | POA: Diagnosis not present

## 2022-02-12 DIAGNOSIS — D62 Acute posthemorrhagic anemia: Secondary | ICD-10-CM | POA: Diagnosis not present

## 2022-02-12 DIAGNOSIS — K625 Hemorrhage of anus and rectum: Secondary | ICD-10-CM | POA: Diagnosis not present

## 2022-02-12 DIAGNOSIS — R1031 Right lower quadrant pain: Secondary | ICD-10-CM | POA: Diagnosis not present

## 2022-02-12 DIAGNOSIS — K602 Anal fissure, unspecified: Secondary | ICD-10-CM | POA: Diagnosis not present

## 2022-02-12 DIAGNOSIS — D649 Anemia, unspecified: Secondary | ICD-10-CM | POA: Diagnosis not present

## 2022-02-12 DIAGNOSIS — K91850 Pouchitis: Secondary | ICD-10-CM | POA: Diagnosis not present

## 2022-02-12 DIAGNOSIS — Z8616 Personal history of COVID-19: Secondary | ICD-10-CM | POA: Diagnosis not present

## 2022-02-12 DIAGNOSIS — R112 Nausea with vomiting, unspecified: Secondary | ICD-10-CM | POA: Diagnosis not present

## 2022-02-12 DIAGNOSIS — Q8789 Other specified congenital malformation syndromes, not elsewhere classified: Secondary | ICD-10-CM | POA: Diagnosis not present

## 2022-02-12 DIAGNOSIS — D126 Benign neoplasm of colon, unspecified: Secondary | ICD-10-CM | POA: Diagnosis not present

## 2022-02-12 DIAGNOSIS — R109 Unspecified abdominal pain: Secondary | ICD-10-CM | POA: Diagnosis not present

## 2022-02-12 DIAGNOSIS — D1391 Familial adenomatous polyposis: Secondary | ICD-10-CM | POA: Diagnosis not present

## 2022-02-12 DIAGNOSIS — Z932 Ileostomy status: Secondary | ICD-10-CM | POA: Diagnosis not present

## 2022-02-12 DIAGNOSIS — K921 Melena: Secondary | ICD-10-CM | POA: Diagnosis not present

## 2022-02-12 DIAGNOSIS — E860 Dehydration: Secondary | ICD-10-CM | POA: Diagnosis not present

## 2022-02-12 DIAGNOSIS — D500 Iron deficiency anemia secondary to blood loss (chronic): Secondary | ICD-10-CM | POA: Diagnosis not present

## 2022-02-12 DIAGNOSIS — R1084 Generalized abdominal pain: Secondary | ICD-10-CM | POA: Diagnosis not present

## 2022-02-12 DIAGNOSIS — R52 Pain, unspecified: Secondary | ICD-10-CM | POA: Diagnosis not present

## 2022-02-12 DIAGNOSIS — R1032 Left lower quadrant pain: Secondary | ICD-10-CM | POA: Diagnosis not present

## 2022-02-12 DIAGNOSIS — K633 Ulcer of intestine: Secondary | ICD-10-CM | POA: Diagnosis not present

## 2022-02-12 DIAGNOSIS — D164 Benign neoplasm of bones of skull and face: Secondary | ICD-10-CM | POA: Diagnosis not present

## 2022-02-12 DIAGNOSIS — R519 Headache, unspecified: Secondary | ICD-10-CM | POA: Diagnosis not present

## 2022-02-12 DIAGNOSIS — H5713 Ocular pain, bilateral: Secondary | ICD-10-CM | POA: Diagnosis not present

## 2022-02-12 DIAGNOSIS — Z9089 Acquired absence of other organs: Secondary | ICD-10-CM | POA: Diagnosis not present

## 2022-02-12 DIAGNOSIS — D48113 Desmoid tumor of abdominal wall: Secondary | ICD-10-CM | POA: Diagnosis not present

## 2022-02-12 DIAGNOSIS — K5900 Constipation, unspecified: Secondary | ICD-10-CM | POA: Diagnosis not present

## 2022-02-12 DIAGNOSIS — F419 Anxiety disorder, unspecified: Secondary | ICD-10-CM | POA: Diagnosis not present

## 2022-02-12 DIAGNOSIS — G8929 Other chronic pain: Secondary | ICD-10-CM | POA: Diagnosis not present

## 2022-02-12 DIAGNOSIS — L270 Generalized skin eruption due to drugs and medicaments taken internally: Secondary | ICD-10-CM | POA: Diagnosis not present

## 2022-02-12 LAB — CBC W/ AUTO DIFF
BASOPHILS ABSOLUTE COUNT: 0 10*9/L (ref 0.0–0.1)
BASOPHILS ABSOLUTE COUNT: 0 10*9/L (ref 0.0–0.1)
BASOPHILS RELATIVE PERCENT: 0.3 %
BASOPHILS RELATIVE PERCENT: 0.7 %
EOSINOPHILS ABSOLUTE COUNT: 0 10*9/L (ref 0.0–0.5)
EOSINOPHILS ABSOLUTE COUNT: 0.1 10*9/L (ref 0.0–0.5)
EOSINOPHILS RELATIVE PERCENT: 0.6 %
EOSINOPHILS RELATIVE PERCENT: 1.8 %
HEMATOCRIT: 25.8 % — ABNORMAL LOW (ref 34.0–44.0)
HEMATOCRIT: 23.4 % — ABNORMAL LOW (ref 34.0–44.0)
HEMOGLOBIN: 8.6 g/dL — ABNORMAL LOW (ref 11.3–14.9)
HEMOGLOBIN: 7.8 g/dL — ABNORMAL LOW (ref 11.3–14.9)
LYMPHOCYTES ABSOLUTE COUNT: 1.3 10*9/L (ref 1.1–3.6)
LYMPHOCYTES ABSOLUTE COUNT: 1.8 10*9/L (ref 1.1–3.6)
LYMPHOCYTES RELATIVE PERCENT: 17.8 %
LYMPHOCYTES RELATIVE PERCENT: 32 %
MEAN CORPUSCULAR HEMOGLOBIN CONC: 33.2 g/dL (ref 32.0–36.0)
MEAN CORPUSCULAR HEMOGLOBIN CONC: 33.3 g/dL (ref 32.0–36.0)
MEAN CORPUSCULAR HEMOGLOBIN: 29 pg (ref 25.9–32.4)
MEAN CORPUSCULAR HEMOGLOBIN: 28.8 pg (ref 25.9–32.4)
MEAN CORPUSCULAR VOLUME: 87.4 fL (ref 77.6–95.7)
MEAN CORPUSCULAR VOLUME: 86.6 fL (ref 77.6–95.7)
MEAN PLATELET VOLUME: 9.5 fL (ref 6.8–10.7)
MEAN PLATELET VOLUME: 9.2 fL (ref 6.8–10.7)
MONOCYTES ABSOLUTE COUNT: 0.5 10*9/L (ref 0.3–0.8)
MONOCYTES ABSOLUTE COUNT: 0.5 10*9/L (ref 0.3–0.8)
MONOCYTES RELATIVE PERCENT: 6.5 %
MONOCYTES RELATIVE PERCENT: 8.9 %
NEUTROPHILS ABSOLUTE COUNT: 5.3 10*9/L (ref 1.8–7.8)
NEUTROPHILS ABSOLUTE COUNT: 3.2 10*9/L (ref 1.8–7.8)
NEUTROPHILS RELATIVE PERCENT: 74.8 %
NEUTROPHILS RELATIVE PERCENT: 56.6 %
PLATELET COUNT: 217 10*9/L (ref 150–450)
PLATELET COUNT: 217 10*9/L (ref 150–450)
RED BLOOD CELL COUNT: 2.95 10*12/L — ABNORMAL LOW (ref 3.95–5.13)
RED BLOOD CELL COUNT: 2.7 10*12/L — ABNORMAL LOW (ref 3.95–5.13)
RED CELL DISTRIBUTION WIDTH: 14.9 % (ref 12.2–15.2)
RED CELL DISTRIBUTION WIDTH: 14.8 % (ref 12.2–15.2)
WBC ADJUSTED: 7.1 10*9/L (ref 3.6–11.2)
WBC ADJUSTED: 5.6 10*9/L (ref 3.6–11.2)

## 2022-02-12 LAB — COMPREHENSIVE METABOLIC PANEL
ALBUMIN: 3.4 g/dL (ref 3.4–5.0)
ALKALINE PHOSPHATASE: 39 U/L — ABNORMAL LOW (ref 46–116)
ALT (SGPT): 8 U/L — ABNORMAL LOW (ref 10–49)
ANION GAP: 4 mmol/L — ABNORMAL LOW (ref 5–14)
AST (SGOT): 15 U/L (ref <=34)
BILIRUBIN TOTAL: 0.3 mg/dL (ref 0.3–1.2)
BLOOD UREA NITROGEN: 15 mg/dL (ref 9–23)
BUN / CREAT RATIO: 36
CALCIUM: 8.7 mg/dL (ref 8.7–10.4)
CHLORIDE: 109 mmol/L — ABNORMAL HIGH (ref 98–107)
CO2: 27 mmol/L (ref 20.0–31.0)
CREATININE: 0.42 mg/dL — ABNORMAL LOW
EGFR CKD-EPI (2021) FEMALE: >90 mL/min/{1.73_m2} (ref >=60)
GLUCOSE RANDOM: 90 mg/dL (ref 70–179)
POTASSIUM: 4 mmol/L (ref 3.4–4.8)
PROTEIN TOTAL: 6.4 g/dL (ref 5.7–8.2)
SODIUM: 140 mmol/L (ref 135–145)

## 2022-02-12 LAB — HCG QUANTITATIVE, BLOOD: GONADOTROPIN, CHORIONIC (HCG) QUANT: <2.6 m[IU]/mL

## 2022-02-12 LAB — PROTIME-INR
INR: 1.05
PROTIME: 11.7 s (ref 9.9–12.6)

## 2022-02-12 LAB — LIPASE: LIPASE: 35 U/L (ref 12–53)

## 2022-02-12 MED ADMIN — iohexoL (OMNIPAQUE) 350 mg iodine/mL solution 100 mL: 100 mL | INTRAVENOUS | @ 22:00:00 | Stop: 2022-02-12

## 2022-02-12 MED ADMIN — ondansetron (ZOFRAN) injection 4 mg: 4 mg | INTRAVENOUS | @ 21:00:00 | Stop: 2022-02-12

## 2022-02-12 MED ADMIN — HYDROmorphone (PF) injection Syrg 0.5 mg: .5 mg | INTRAVENOUS | @ 21:00:00 | Stop: 2022-02-12

## 2022-02-12 MED ADMIN — HYDROmorphone (PF) (DILAUDID) injection 0.5 mg: .5 mg | INTRAVENOUS | @ 22:00:00 | Stop: 2022-02-12

## 2022-02-12 NOTE — Unmapped (Signed)
After messaging the team regarding patient's current condition as reported by her mom, the consensus was to have her go to the ED. Relayed this to [place] and her mom and they will gather supplies and head to [facility]. Will relay to team and also give the ED a head's up of their arrival in about 2 hours.

## 2022-02-12 NOTE — Unmapped (Signed)
[name]  Emergency Department Provider Note    ED Clinical Impression     Final diagnoses:   Anemia, unspecified type (Primary)   [name] (familial adenomatous polyposis)       HPI, ED Course, Assessment and Plan     Initial Clinical Impression:    February 12, 2022 2:49 PM   [name] [name] is a 21 y.o. female with past medical history of  [name] associated desmoid fibromatosis, malnutrition s/p colectomy, chronic abdominal pain, anxiety/depression, currently on TPN and sorafenib for mesenteric desmoids presenting with an abnormal lab. The patient was sent by her oncologist for admission and a blood transfusion due to a low hemoglobin of 7.6 in the setting of a known GI bleed and active chemotherapy.  Patient states she has noticed symptoms of increased fatigue, shortness of breath for the past 2-3 weeks, since being discharged on 01/22/2022.  Has also noticed bright red blood in her stool for the past few weeks.  Patient reached out to her oncology team, they were concerned given her drop in hemoglobin along with known ulcer from prior endoscopy that could be causing her bleeding and recommended that she come to the ED for likely admission with consultation to GI.  She denies any fevers.  Does endorse increased pain over her right lower quadrant in the area of her tumor.    BP 117/66  - Pulse 100  - Temp 36.7 ??C (98 ??F) (Oral)  - Resp 14  - Wt 54.4 kg (120 lb)  - LMP 01/15/2022 (Exact Date)  - SpO2 98%  - BMI 19.97 kg/m??     On exam, patient chronically ill-appearing, however no acute distress.  Vital signs significant for tachycardia with heart rate 122, normotensive, afebrile and satting appropriate on room air.  Patient does have conjunctival pallor.  Lungs clear to auscultation bilaterally.  Abdomen is soft, nondistended, tender to palpation over the right lower quadrant, however no rebound or guarding.  Rectal exam with no bright red blood.    Medical Decision Making    Differential diagnosis includes but is not limited to anemia, upper GI bleed, lower GI bleed, among others.  Rectal exam reassuring with no bright red blood, patient is normotensive, therefore have low suspicion for brisk GI bleed.  Certainly possible patient could have slow GI bleed causing bright red blood in her stool and anemia.  CBC today with hemoglobin stable at 8.6, no indication for urgent transfusion at this time.  CMP with no electro abnormalities and creatinine at baseline.  Beta-hCG negative.  Lipase within normal limits.  Will obtain CT abdomen/pelvis given increasing abdominal pain in setting of bleeding.  Spoke with solid tumor oncology fellow who agrees with admission for further evaluation by GI, will reach out to [facility] to assess bed situation on the medical oncology team.  Patient given Dilaudid for pain control.    Further ED updates and updates to plan as per ED Course below:    ED Course as of 02/12/22 1938   Wed Feb 12, 2022   1725 [facility] paged for admission 17:26    1937 Spoke with admitting team         Independent Interpretation of Studies: I have independently interpreted the following studies:  CT A/P: unchanged size and distribution of multiple mesenteric masses compatible with desmoid tumors; similar tethering of bowel within the pelvis, without evidence of obstruction.    Discussion of Management With Other Providers or Support Staff: I discussed the management of  this patient with the:  Sold tumor oncology fellow - agrees with admission   [facility] - oncology team, will admit for further management    Considerations Regarding Disposition/Escalation of Care and Critical Care:  Patient requires admission for further evaluation of rectal bleeding with anemia    Social Determinants of Health with Concerns     Internet Connectivity: Not on file   Alcohol Use: Not on file   Substance Use: Not on file   Health Literacy: Not on file   Physical Activity: Not on file   Interpersonal Safety: Not on file   Stress: Not on file   Intimate Partner Violence: Not on file   Depression: Not on file   Social Connections: Not on file     _____________________________________________________________________    The case was discussed with the attending physician who is in agreement with the above assessment and plan    Past History     PAST MEDICAL HISTORY/PAST SURGICAL HISTORY:   Past Medical History:   Diagnosis Date    Abdominal pain     Acid reflux     occas    Anesthesia complication     itching, shaking, coldness; last few surgeries have gone much better    Cancer (CMS-HCC)     Cataract of right eye     COVID-19 virus infection 01/2019    Cyst of thyroid determined by ultrasound     monitoring    [name]     2 right forearm, 1 left thigh, 1 right scapula, 1 under left clavicle; multiple    Difficult intravenous access     [name] (familial adenomatous polyposis)     [name]     Gastric polyps     History of chemotherapy     last treatment approx 05/2019    History of colon polyps     History of COVID-19 01/2019    Iron deficiency anemia due to chronic blood loss     received iron infusion 11-2019    PONV (postoperative nausea and vomiting)     Rectal bleeding     Syncopal episodes     especially if becoming dehydrated       Past Surgical History:   Procedure Laterality Date    cyroablation      cystis removal      desmoid removal      PR CLOSE ENTEROSTOMY,RESEC+ANAST N/A 10/09/2020    Procedure: ILEOSTOMY TAKEDOWN;  Surgeon: Mickle Asper, MD;  Location: OR [place];  Service: General Surgery    PR COLONOSCOPY W/BIOPSY SINGLE/MULTIPLE N/A 10/27/2012    Procedure: COLONOSCOPY, FLEXIBLE, PROXIMAL TO SPLENIC FLEXURE; WITH BIOPSY, SINGLE OR MULTIPLE;  Surgeon: Shirlyn Goltz Mir, MD;  Location: PEDS PROCEDURE ROOM [place];  Service: Gastroenterology    PR COLONOSCOPY W/BIOPSY SINGLE/MULTIPLE N/A 09/14/2013    Procedure: COLONOSCOPY, FLEXIBLE, PROXIMAL TO SPLENIC FLEXURE; WITH BIOPSY, SINGLE OR MULTIPLE;  Surgeon: Shirlyn Goltz Mir, MD;  Location: PEDS PROCEDURE ROOM [place];  Service: Gastroenterology    PR COLONOSCOPY W/BIOPSY SINGLE/MULTIPLE N/A 11/08/2014    Procedure: COLONOSCOPY, FLEXIBLE, PROXIMAL TO SPLENIC FLEXURE; WITH BIOPSY, SINGLE OR MULTIPLE;  Surgeon: Arnold Long Mir, MD;  Location: PEDS PROCEDURE ROOM [facility];  Service: Gastroenterology    PR COLONOSCOPY W/BIOPSY SINGLE/MULTIPLE N/A 12/26/2015    Procedure: COLONOSCOPY, FLEXIBLE, PROXIMAL TO SPLENIC FLEXURE; WITH BIOPSY, SINGLE OR MULTIPLE;  Surgeon: Arnold Long Mir, MD;  Location: PEDS PROCEDURE ROOM [facility]  Service: Gastroenterology    PR COLONOSCOPY W/BIOPSY SINGLE/MULTIPLE N/A 09/02/2017  Procedure: COLONOSCOPY, FLEXIBLE, PROXIMAL TO SPLENIC FLEXURE; WITH BIOPSY, SINGLE OR MULTIPLE;  Surgeon: Arnold Long Mir, MD;  Location: PEDS PROCEDURE ROOM [facility];  Service: Gastroenterology    PR COLSC FLX W/REMOVAL LESION BY HOT BX FORCEPS N/A 08/27/2016    Procedure: COLONOSCOPY, FLEXIBLE, PROXIMAL TO SPLENIC FLEXURE; W/REMOVAL TUMOR/POLYP/OTHER LESION, HOT BX FORCEP/CAUTE;  Surgeon: Arnold Long Mir, MD;  Location: PEDS PROCEDURE ROOM [facility];  Service: Gastroenterology    PR COLSC FLX W/RMVL OF TUMOR POLYP LESION SNARE TQ N/A 02/25/2019    Procedure: COLONOSCOPY FLEX; W/REMOV TUMOR/LES BY SNARE;  Surgeon: Helyn Numbers, MD;  Location: GI PROCEDURES [place] [facility];  Service: Gastroenterology    PR COLSC FLX W/RMVL OF TUMOR POLYP LESION SNARE TQ N/A 03/13/2020    Procedure: COLONOSCOPY FLEX; W/REMOV TUMOR/LES BY SNARE;  Surgeon: Helyn Numbers, MD;  Location: GI PROCEDURES [place] [facility];  Service: Gastroenterology    PR EXC SKIN BENIG 2.1-3 CM TRUNK,ARM,LEG Right 02/25/2017    Procedure: EXCISION, BENIGN LESION INCLUDE MARGINS, EXCEPT SKIN TAG, LEGS; EXCISED DIAMETER 2.1 TO 3.0 CM;  Surgeon: Clarene Duke, MD;  Location: CHILDRENS OR [facility];  Service: Plastics    PR EXC SKIN BENIG 3.1-4 CM TRUNK,ARM,LEG Right 02/25/2017    Procedure: EXCISION, BENIGN LESION INCLUDE MARGINS, EXCEPT SKIN TAG, ARMS; EXCISED DIAMETER 3.1 TO 4.0 CM;  Surgeon: Clarene Duke, MD;  Location: CHILDRENS OR [facility];  Service: Plastics    PR EXC SKIN BENIG >4 CM FACE,FACIAL Right 02/25/2017    Procedure: EXCISION, OTHER BENIGN LES INCLUD MARGIN, FACE/EARS/EYELIDS/NOSE/LIPS/MUCOUS MEMBRANE; EXCISED DIAM >4.0 CM;  Surgeon: Clarene Duke, MD;  Location: CHILDRENS OR [facility];  Service: Plastics    PR EXC TUMOR SOFT TISSUE LEG/ANKLE SUBQ 3+CM Right 08/05/2019    Procedure: EXCISION, TUMOR, SOFT TISSUE OF LEG OR ANKLE AREA, SUBCUTANEOUS; 3 CM OR GREATER;  Surgeon: Arsenio Katz, MD;  Location: MAIN OR [place];  Service: Plastics    PR EXC TUMOR SOFT TISSUE LEG/ANKLE SUBQ <3CM Right 08/05/2019    Procedure: EXCISION, TUMOR, SOFT TISSUE OF LEG OR ANKLE AREA, SUBCUTANEOUS; LESS THAN 3 CM;  Surgeon: Arsenio Katz, MD;  Location: MAIN OR [facility];  Service: Plastics    PR LAP, SURG PROCTECTOMY W J-POUCH N/A 08/10/2020    Procedure: ROBOTIC ASSISTED LAPAROSCOPIC PROCTOCOLECTOMY, ILEAL J POUCH, WITH OSTOMY;  Surgeon: Mickle Asper, MD;  Location: OR [place];  Service: General Surgery    PR NDSC EVAL INTSTINAL POUCH DX W/COLLJ SPEC SPX N/A 01/23/2021    Procedure: ENDO EVAL SM INTEST POUCH; DX;  Surgeon: Modena Nunnery, MD;  Location: [place] PROCEDURES [place] [facility];  Service: Gastroenterology    PR NDSC EVAL INTSTINAL POUCH DX W/COLLJ SPEC SPX N/A 08/27/2021    Procedure: ENDO EVAL SM INTEST POUCH; DX;  Surgeon: Hunt Oris, MD;  Location: [facility]URES [facility];  Service: Gastroenterology    PR NDSC EVAL INTSTINAL POUCH DX W/COLLJ SPEC SPX N/A 12/09/2021    Procedure: ENDO EVAL SM INTEST POUCH; DX;  Surgeon: Vidal Schwalbe, MD;  Location: GI PROCEDURES [facility];  Service: Gastroenterology    PR NDSC EVAL INTSTINAL POUCH W/BX SINGLE/MULTIPLE N/A 01/20/2022    Procedure: ENDOSCOPIC EVAL OF SMALL INTESTINAL POUCH; DIAGNOSTIC, No biopsies;  Surgeon: Andrey Farmer, MD;  Location: GI PROCEDURES [facility];  Service: Gastroenterology    PR UNLISTED PROCEDURE SMALL INTESTINE  01/23/2021    Procedure: UNLISTED PROCEDURE, SMALL INTESTINE;  Surgeon: Modena Nunnery, MD;  Location: GI PROCEDURES [place] [facility];  Service: Gastroenterology    PR UPPER GI ENDOSCOPY,BIOPSY N/A 10/27/2012    Procedure: UGI ENDOSCOPY; WITH BIOPSY, SINGLE OR MULTIPLE;  Surgeon: Shirlyn Goltz Mir, MD;  Location: PEDS PROCEDURE ROOM [facility];  Service: Gastroenterology    PR UPPER GI ENDOSCOPY,BIOPSY N/A 09/14/2013    Procedure: UGI ENDOSCOPY; WITH BIOPSY, SINGLE OR MULTIPLE;  Surgeon: Shirlyn Goltz Mir, MD;  Location: PEDS PROCEDURE ROOM [facility];  Service: Gastroenterology    PR UPPER GI ENDOSCOPY,BIOPSY N/A 11/08/2014    Procedure: UGI ENDOSCOPY; WITH BIOPSY, SINGLE OR MULTIPLE;  Surgeon: Arnold Long Mir, MD;  Location: PEDS PROCEDURE ROOM [facility];  Service: Gastroenterology    PR UPPER GI ENDOSCOPY,BIOPSY N/A 12/26/2015    Procedure: UGI ENDOSCOPY; WITH BIOPSY, SINGLE OR MULTIPLE;  Surgeon: Arnold Long Mir, MD;  Location: PEDS PROCEDURE ROOM [facility];  Service: Gastroenterology    PR UPPER GI ENDOSCOPY,BIOPSY N/A 08/27/2016    Procedure: UGI ENDOSCOPY; WITH BIOPSY, SINGLE OR MULTIPLE;  Surgeon: Arnold Long Mir, MD;  Location: PEDS PROCEDURE ROOM [facility];  Service: Gastroenterology    PR UPPER GI ENDOSCOPY,BIOPSY N/A 09/02/2017    Procedure: UGI ENDOSCOPY; WITH BIOPSY, SINGLE OR MULTIPLE;  Surgeon: Arnold Long Mir, MD;  Location: PEDS PROCEDURE ROOM [facility];  Service: Gastroenterology    PR UPPER GI ENDOSCOPY,BIOPSY N/A 03/13/2020    Procedure: UGI ENDOSCOPY; WITH BIOPSY, SINGLE OR MULTIPLE;  Surgeon: Helyn Numbers, MD;  Location: GI PROCEDURES [place] [facility];  Service: Gastroenterology    PR UPPER GI ENDOSCOPY,BIOPSY N/A 09/05/2021    Procedure: UGI ENDOSCOPY; WITH BIOPSY, SINGLE OR MULTIPLE;  Surgeon: Wendall Papa, MD;  Location: GI PROCEDURES [facility];  Service: Gastroenterology    PR UPPER GI ENDOSCOPY,DIAGNOSIS N/A 01/20/2022    Procedure: UGI ENDO, INCLUDE ESOPHAGUS, STOMACH, & DUODENUM &/OR JEJUNUM; DX W/WO COLLECTION SPECIMN, BY BRUSH OR WASH;  Surgeon: Andrey Farmer, MD;  Location: GI PROCEDURES [facility];  Service: Gastroenterology    TUMOR REMOVAL      multiple-head, neck, back, hand, right flank, multiple       MEDICATIONS:     Current Facility-Administered Medications:     HYDROmorphone (PF) (DILAUDID) injection 1 mg, 1 mg, Intravenous, Once, [place], Lyman Speller, MD    Current Outpatient Medications:     acetaminophen (TYLENOL) 500 MG tablet, Take 2 tablets (1,000 mg total) by mouth two (2) times a day., Disp: , Rfl:     apixaban (ELIQUIS) 2.5 mg Tab, Take 1 tablet (2.5 mg total) by mouth two (2) times a day., Disp: 60 tablet, Rfl: 0    baclofen (LIORESAL) 5 mg Tab tablet, Take 1-2 tablets (5-10 mg total) by mouth two (2) times a day as needed for muscle spasms., Disp: 40 tablet, Rfl: 0    buprenorphine 10 mcg/hour PTWK transdermal patch, Place 1 patch on the skin every seven (7) days., Disp: 4 patch, Rfl: 2    cefdinir (OMNICEF) 300 MG capsule, Take 1 capsule (300 mg total) by mouth Two (2) times a day., Disp: 180 capsule, Rfl: 1    clobetasoL (TEMOVATE) 0.05 % ointment, Apply topically two (2) times a day. To stubborn/thick rashes on hands/feet ears until clear/smooth. Restart as needed, Disp: 60 g, Rfl: 3    COURIERED MED OR SUPPLY, [id number], SORAFENIB 200MG  TABLET, Disp: 1 each, Rfl: 0    diclofenac sodium (VOLTAREN) 1 % gel, Apply 2 g topically four (4) times a day., Disp: 100 g, Rfl: 0    diphenhydrAMINE (BENADRYL) 50 mg capsule, Take 1 capsule (50 mg total) by  mouth every six (6) hours as needed for itching (per patient preference (if prefers over hydroxyzine))., Disp: 30 each, Rfl: 0    famotidine (PEPCID) 20 MG tablet, Take 1 tablet (20 mg total) by mouth nightly., Disp: 90 tablet, Rfl: 3    gabapentin (NEURONTIN) 100 MG capsule, Take 2 capsules (200 mg total) by mouth nightly., Disp: 60 capsule, Rfl: 0    hydrocortisone (ANUSOL-HC) 25 mg suppository, Insert 1 suppository (25 mg total) into the rectum two (2) times a day as needed for hemorrhoids (possible rectal/anal bleeding source)., Disp: 30 suppository, Rfl: 0    hydrocortisone 2.5 % cream, Apply 1 application. topically daily as needed., Disp: , Rfl:     hydrOXYzine (ATARAX) 50 MG tablet, Take 1 tablet (50 mg total) by mouth every six (6) hours as needed for itching or anxiety (also insomnia)., Disp: 30 tablet, Rfl: 0    hyoscyamine (LEVSIN) 0.125 mg tablet, Take 1 tablet (0.125 mg total) by mouth every four (4) hours as needed for cramping., Disp: , Rfl:     lidocaine (LIDODERM) 5 % patch, Place 1 patch on the skin daily. Apply to affected area for 12 hours only each day (then remove patch), Disp: , Rfl:     menthol-zinc oxide (CALMOSEPTINE) 0.44-20.6 % Oint, Apply topically four (4) times a day as needed., Disp: 113 g, Rfl: 11    mirtazapine (REMERON) 7.5 MG tablet, Take 1 tablet (7.5 mg total) by mouth nightly., Disp: 90 tablet, Rfl: 3    naloxone (NARCAN) 4 mg nasal spray, One spray in either nostril once for known/suspected opioid overdose. May repeat every 2-3 minutes in alternating nostril til EMS arrives, Disp: 2 each, Rfl: 0    NIFEdipine 0.3% lidocaine 1.5% in petrolatum ointment, Apply topically two (2) times a day., Disp: 30 g, Rfl: 0    OLANZapine (ZYPREXA) 7.5 MG tablet, Take 1 tablet (7.5 mg total) by mouth nightly., Disp: 30 tablet, Rfl: 2    ondansetron (ZOFRAN-ODT) 8 MG disintegrating tablet, Take 1 tablet (8 mg total) by mouth every twelve (12) hours as needed for nausea., Disp: , Rfl:     oxyCODONE (ROXICODONE) 10 mg immediate release tablet, Take 1 tablet (10 mg total) by mouth every four (4) hours as needed for pain. Fill on or after: 03/01/22, Disp: 100 tablet, Rfl: 0    pimecrolimus (ELIDEL) 1 % cream, Apply topically two (2) times a day. To face as needed for rash, Disp: 30 g, Rfl: 11    silver sulfaDIAZINE (SILVADENE) 1 % cream, Apply topically daily. To burn on leg, Disp: 50 g, Rfl: 11    SLEEPY BUTTER, Swish and spit 10 mL 4 times a day as needed., Disp: 300 mL, Rfl: 3    SORAfenib (NEXAVAR) 200 mg tablet, Take 1 tablet (200 mg total) by mouth daily., Disp: 60 tablet, Rfl: 2    triamcinolone (KENALOG) 0.1 % cream, Apply topically two (2) times a day., Disp: 28 g, Rfl: 3    zinc oxide 10 % Crea, Apply 1 application. topically daily as needed., Disp: , Rfl:     ALLERGIES:   Adhesive tape-silicones; Ferrlecit [sodium ferric gluconat-sucrose]; Levofloxacin; Methylnaltrexone; Neomycin; Papaya; Morphine; Zosyn [piperacillin-tazobactam]; Compazine [prochlorperazine]; and Latex, natural rubber    SOCIAL HISTORY:   Social History     Tobacco Use    Smoking status: Never     Passive exposure: Past    Smokeless tobacco: Never   Substance Use Topics    Alcohol use: Never  FAMILY HISTORY:  Family History   Problem Relation Age of Onset    No Known Problems Mother     No Known Problems Father     No Known Problems Sister     No Known Problems Brother     Stroke Maternal Grandmother     Other Maternal Grandmother         benign lesions of liver and pancreas, further details unknown    Cancer Maternal Grandmother     Diabetes Maternal Grandmother     Hypertension Maternal Grandmother     Thyroid disease Maternal Grandmother     Arthritis Maternal Grandfather     Asthma Maternal Grandfather     COPD Paternal Grandmother         Deceased    Miscarriages / Stillbirths Paternal Grandmother     Alcohol abuse Paternal Grandfather         Deceased    No Known Problems Maternal Aunt     No Known Problems Maternal Uncle     No Known Problems Paternal Aunt     No Known Problems Paternal Uncle     Anesthesia problems Neg Hx     Broken bones Neg Hx     Cancer Neg Hx     Clotting disorder Neg Hx     Collagen disease Neg Hx     Diabetes Neg Hx     Dislocations Neg Hx     Fibromyalgia Neg Hx     Gout Neg Hx     Hemophilia Neg Hx     Osteoporosis Neg Hx     Rheumatologic disease Neg Hx Scoliosis Neg Hx     Severe sprains Neg Hx     Sickle cell anemia Neg Hx     Spinal Compression Fracture Neg Hx     Melanoma Neg Hx     Basal cell carcinoma Neg Hx     Squamous cell carcinoma Neg Hx           Review of Systems     A review of systems was performed and relevant portions were as noted above in HPI     Physical Exam     VITAL SIGNS:    BP 117/66  - Pulse 100  - Temp 36.7 ??C (98 ??F) (Oral)  - Resp 14  - Wt 54.4 kg (120 lb)  - LMP 01/15/2022 (Exact Date)  - SpO2 98%  - BMI 19.97 kg/m??     Constitutional:   Alert and oriented.   Head:   Normocephalic and atraumatic  Eyes:   Conjunctivae are normal, EOMI, PERRL; conjunctival pallor  ENT:   No notable congestion, Mucous membranes moist, External ears normal, no notable stridor  Cardiovascular:   Rate as vitals above. Appears warm and well perfused  Respiratory:   Normal respiratory effort. Breath sounds are normal.  Gastrointestinal:   Soft, non-distended, tender to palpation over RLQ, no rebound or guarding.  Genitourinary:   Deferred  Musculoskeletal:    Normal range of motion in all extremities. No tenderness or edema noted in B/L lower extremities  Neurologic:   No gross focal neurologic deficits beyond baseline are appreciated.  Skin:   Skin is warm, dry and intact.       Radiology     CT Abdomen Pelvis W IV Contrast   Final Result   -Unchanged size and distribution of multiple mesenteric masses compatible with desmoid tumors.      -There is similar tethering of  bowel within the pelvis, without evidence of obstruction.             Labs     Labs Reviewed   COMPREHENSIVE METABOLIC PANEL - Abnormal; Notable for the following components:       Result Value    Chloride 109 (*)     Anion Gap 4 (*)     Creatinine 0.42 (*)     ALT 8 (*)     Alkaline Phosphatase 39 (*)     All other components within normal limits   CBC W/ AUTO DIFF - Abnormal; Notable for the following components:    RBC 2.95 (*)     HGB 8.6 (*)     HCT 25.8 (*)     All other components within normal limits   LIPASE - Normal   CBC W/ DIFFERENTIAL    Narrative:     The following orders were created for panel order CBC w/ Differential.                  Procedure                               Abnormality         Status                                     ---------                               -----------         ------                                     CBC w/ Differential[[id number]]         Abnormal            Final result                                                 Please view results for these tests on the individual orders.   HCG QUANTITATIVE, BLOOD    Narrative:     Non-Pregnant Females: 1.5 - 4.2 [name]/mL                                    Post and Peri-menopausal Females: 1.8 - 10.1 [name]/mL                                    During pregnancy, hCG increases exponentially for about 8-10 weeks after conception and begins falling at about 12 weeks. Week-to-week hCG levels during pregnancy show significant overlap and are not a reliable means of determining gestational age. Post and peri-menopausal females may have low levels of detectable hCG due to pituitary production of hCG; in such cases correlation with serum FSH may be helpful. Test interference may occur from heterophilic antibodies or high levels of biotin.  Pertinent labs & imaging results that were available during my care of the patient were reviewed by me and considered in my medical decision making (see chart for details).    Please note- This chart has been created using [employer]. Chart creation errors have been sought, but may not always be located and such creation errors, especially pronoun confusion, do NOT reflect on the standard of medical care.    Documentation assistance was provided by Desma Paganini, Scribe, on February 12, 2022 at 2:49 PM for Herma Carson, MD.      Documentation assistance was provided by the scribe in my presence.  The documentation recorded by the scribe has been reviewed by me and accurately reflects the services I personally performed.         Olegario Messier, MD  Resident  02/12/22 [phone]

## 2022-02-12 NOTE — Unmapped (Signed)
Patient sent by oncologist for admission and blood transfusion. HGB 7.6 - known GI bleed. +pallor, HR 153. Active chemo

## 2022-02-12 NOTE — Unmapped (Signed)
Pt sent by oncologist for admission and blood transfusion d/t hgb 7.6, known GI bleed. Active chemo, see note from earlier today.

## 2022-02-13 DIAGNOSIS — R1084 Generalized abdominal pain: Secondary | ICD-10-CM | POA: Diagnosis not present

## 2022-02-13 DIAGNOSIS — R112 Nausea with vomiting, unspecified: Secondary | ICD-10-CM | POA: Diagnosis not present

## 2022-02-13 DIAGNOSIS — D649 Anemia, unspecified: Secondary | ICD-10-CM | POA: Diagnosis not present

## 2022-02-13 DIAGNOSIS — Z932 Ileostomy status: Secondary | ICD-10-CM | POA: Diagnosis not present

## 2022-02-13 DIAGNOSIS — E860 Dehydration: Secondary | ICD-10-CM | POA: Diagnosis not present

## 2022-02-13 LAB — CBC W/ AUTO DIFF
BASOPHILS ABSOLUTE COUNT: 0 10*9/L (ref 0.0–0.1)
BASOPHILS RELATIVE PERCENT: 0.8 %
EOSINOPHILS ABSOLUTE COUNT: 0.1 10*9/L (ref 0.0–0.5)
EOSINOPHILS RELATIVE PERCENT: 2.2 %
HEMATOCRIT: 22.4 % — ABNORMAL LOW (ref 34.0–44.0)
HEMOGLOBIN: 7.5 g/dL — ABNORMAL LOW (ref 11.3–14.9)
LYMPHOCYTES ABSOLUTE COUNT: 1.9 10*9/L (ref 1.1–3.6)
LYMPHOCYTES RELATIVE PERCENT: 32.6 %
MEAN CORPUSCULAR HEMOGLOBIN CONC: 33.6 g/dL (ref 32.0–36.0)
MEAN CORPUSCULAR HEMOGLOBIN: 29.2 pg (ref 25.9–32.4)
MEAN CORPUSCULAR VOLUME: 86.8 fL (ref 77.6–95.7)
MEAN PLATELET VOLUME: 9.4 fL (ref 6.8–10.7)
MONOCYTES ABSOLUTE COUNT: 0.6 10*9/L (ref 0.3–0.8)
MONOCYTES RELATIVE PERCENT: 10 %
NEUTROPHILS ABSOLUTE COUNT: 3.1 10*9/L (ref 1.8–7.8)
NEUTROPHILS RELATIVE PERCENT: 54.4 %
PLATELET COUNT: 214 10*9/L (ref 150–450)
RED BLOOD CELL COUNT: 2.58 10*12/L — ABNORMAL LOW (ref 3.95–5.13)
RED CELL DISTRIBUTION WIDTH: 14.6 % (ref 12.2–15.2)
WBC ADJUSTED: 5.7 10*9/L (ref 3.6–11.2)

## 2022-02-13 LAB — BASIC METABOLIC PANEL
ANION GAP: 5 mmol/L (ref 5–14)
BLOOD UREA NITROGEN: 14 mg/dL (ref 9–23)
BUN / CREAT RATIO: 24
CALCIUM: 8.3 mg/dL — ABNORMAL LOW (ref 8.7–10.4)
CHLORIDE: 109 mmol/L — ABNORMAL HIGH (ref 98–107)
CO2: 28 mmol/L (ref 20.0–31.0)
CREATININE: 0.58 mg/dL
EGFR CKD-EPI (2021) FEMALE: >90 mL/min/{1.73_m2} (ref >=60)
GLUCOSE RANDOM: 100 mg/dL — ABNORMAL HIGH (ref 70–99)
POTASSIUM: 3.6 mmol/L (ref 3.4–4.8)
SODIUM: 142 mmol/L (ref 135–145)

## 2022-02-13 LAB — HEMOGLOBIN AND HEMATOCRIT, BLOOD
HEMATOCRIT: 21.6 % — ABNORMAL LOW (ref 34.0–44.0)
HEMOGLOBIN: 7.3 g/dL — ABNORMAL LOW (ref 11.3–14.9)

## 2022-02-13 LAB — PHOSPHORUS: PHOSPHORUS: 4.6 mg/dL (ref 2.4–5.1)

## 2022-02-13 LAB — MAGNESIUM: MAGNESIUM: 2.1 mg/dL (ref 1.6–2.6)

## 2022-02-13 MED ADMIN — ondansetron (ZOFRAN) injection: INTRAVENOUS | @ 20:00:00 | Stop: 2022-02-13

## 2022-02-13 MED ADMIN — HYDROmorphone (PF) (DILAUDID) injection 0.2 mg: .2 mg | INTRAVENOUS | @ 21:00:00 | Stop: 2022-02-13

## 2022-02-13 MED ADMIN — phenylephrine 0.8 mg/10 mL (80 mcg/mL) injection: INTRAVENOUS | @ 20:00:00 | Stop: 2022-02-13

## 2022-02-13 MED ADMIN — Propofol (DIPRIVAN) injection: INTRAVENOUS | @ 20:00:00 | Stop: 2022-02-13

## 2022-02-13 MED ADMIN — oxyCODONE (ROXICODONE) immediate release tablet 5 mg: 5 mg | ORAL | @ 21:00:00 | Stop: 2022-02-13

## 2022-02-13 MED ADMIN — HYDROmorphone (PF) injection Syrg 0.5 mg: .5 mg | INTRAVENOUS | @ 14:00:00 | Stop: 2022-02-13

## 2022-02-13 MED ADMIN — lactated Ringers infusion: 10 mL/h | INTRAVENOUS | @ 18:00:00

## 2022-02-13 MED ADMIN — OLANZapine (ZyPREXA) tablet 7.5 mg: 7.5 mg | ORAL | @ 03:00:00

## 2022-02-13 MED ADMIN — HYDROmorphone (PF) (DILAUDID) injection 1 mg: 1 mg | INTRAVENOUS | @ 16:00:00 | Stop: 2022-02-13

## 2022-02-13 MED ADMIN — pantoprazole (Protonix) injection 40 mg: 40 mg | INTRAVENOUS | @ 14:00:00

## 2022-02-13 MED ADMIN — HYDROmorphone (PF) injection Syrg 0.5 mg: .5 mg | INTRAVENOUS | @ 04:00:00 | Stop: 2022-02-15

## 2022-02-13 MED ADMIN — HYDROmorphone (PF) injection Syrg 0.5 mg: .5 mg | INTRAVENOUS | @ 08:00:00 | Stop: 2022-02-13

## 2022-02-13 MED ADMIN — mirtazapine (REMERON) tablet 7.5 mg: 7.5 mg | ORAL | @ 03:00:00

## 2022-02-13 MED ADMIN — acetaminophen (TYLENOL) tablet 1,000 mg: 1000 mg | ORAL | @ 21:00:00 | Stop: 2022-02-13

## 2022-02-13 MED ADMIN — ketorolac (TORADOL) injection: INTRAVENOUS | @ 21:00:00 | Stop: 2022-02-13

## 2022-02-13 MED ADMIN — ondansetron (ZOFRAN-ODT) disintegrating tablet 4 mg: 4 mg | ORAL | @ 14:00:00

## 2022-02-13 MED ADMIN — cefdinir (OMNICEF) capsule 300 mg: 300 mg | ORAL | @ 14:00:00 | Stop: 2022-02-27

## 2022-02-13 MED ADMIN — oxyCODONE (ROXICODONE) immediate release tablet 10 mg: 10 mg | ORAL | @ 03:00:00 | Stop: 2022-02-26

## 2022-02-13 MED ADMIN — dexmedeTOMIDine (Precedex) injection: INTRAVENOUS | @ 21:00:00 | Stop: 2022-02-13

## 2022-02-13 MED ADMIN — gabapentin (NEURONTIN) capsule 200 mg: 200 mg | ORAL | @ 03:00:00

## 2022-02-13 MED ADMIN — oxyCODONE (ROXICODONE) immediate release tablet 10 mg: 10 mg | ORAL | @ 10:00:00 | Stop: 2022-02-26

## 2022-02-13 MED ADMIN — HYDROmorphone (PF) (DILAUDID) injection 1 mg: 1 mg | INTRAVENOUS | @ 01:00:00 | Stop: 2022-02-12

## 2022-02-13 MED ADMIN — lidocaine (XYLOCAINE) 20 mg/mL (2 %) injection: INTRAVENOUS | @ 20:00:00 | Stop: 2022-02-13

## 2022-02-13 MED ADMIN — famotidine (PEPCID) tablet 20 mg: 20 mg | ORAL | @ 03:00:00 | Stop: 2022-02-12

## 2022-02-13 MED ADMIN — clobetasoL (TEMOVATE) 0.05 % ointment: TOPICAL | @ 16:00:00

## 2022-02-13 NOTE — Unmapped (Signed)
Gastroenterology (Luminal) Consult Service   Initial Consultation         Assessment and Recommendations:   [name] [name] is a 21 y.o. female with a PMHx of [name] s/p IPA on TPN for nutritional support who presented to [facility] with acute on chronic anemia. The patient is seen in consultation at the request of Jeannine Boga, MD (Oncology/Hematology (MDE)) for hematochezia    #Acute on chronic anemia  #Hematochezia  Recent similar presentation for hematochezia and found to have anal fissure and solitary ulcer at the ileocolonic anastomosis. Will plan for repeat pouch exam to reevaluate in light of her worsening anemia.   - NPO  - Hold eliquis  - Will plan for pouchoscopy today, please give 2x tap water enemas now with plan for procedure late morning.    GI Pre-Procedure Checklist  Procedure:  pouchoscopy  Anticipated Date of Procedure: 02/13/22  Anticoagulants/Antiplatelets: Hold eliquis     Anesthesia Concerns: None  Diet: Keep patient NPO  Prep: Administer two tap water enemas 30 minutes apart    Recommendations discussed with the patient's primary team. We will continue to follow along with you.    For questions, contact the on-call fellow for the Gastroenterology (Luminal) Consult Service.    Subjective:   49F with PMH [name] s/p IPA on TPN who presents for acute drop in Hgb. She recently presented with abdominal pain and hematochezia. She underwent EGD and pouchoscopy during that admission on 2022/01/29. EGD grossly negative. Pouch exam with anal fissure and solitary ulcer at the ileocolonic anastomosis site without bleeding or stigmata of recent bleeding. Hematochezia was felt to be multifactorial secondary to the ulcer and anal fissure in the setting anticoagulation.  Patency capsule and VCE were deferred at the time given concern that the capsule would not pass and her bleeding was ultimately attributed to the fissures.     She now represents for acute decrease in Hgb. She had clinic appointment 11/30 with Dr. [name] and reported improvement in abdominal pain with 4-5 BM daily still with intermittent episode of hematochezia. Labs at that visit with Hgb 9.5. She then had TPN labs 12/6 with Hgb 7.8. Repeat 7.5. She presented to the ED. BP stable with tachycardia initially to 120s now resolved after fluids. Today she continues to report L sided abdominal pain.    Last dose eliquis 10pm on 12/5.  Objective:   Temp:  [36.4 ??C (97.5 ??F)-37.2 ??C (99 ??F)] 36.5 ??C (97.7 ??F)  Heart Rate:  [74-153] 91  Resp:  [14-18] 17  BP: (101-135)/(51-84) 109/56  SpO2:  [98 %-99 %] 99 %    Gen: WDWN female in NAD, answers questions appropriately  Abdomen: Soft, nondistended. TTP diffusely but no rebound or guarding.  Extremities: No edema in the BLEs    Pertinent Labs/Studies Reviewed:  GI Procedures 01-29-22 reviewed    Lab Results   Component Value Date    WBC 5.7 02/13/2022    HGB 7.5 (L) 02/13/2022    HCT 22.4 (L) 02/13/2022    PLT 214 02/13/2022       Lab Results   Component Value Date    NA 142 02/13/2022    K 3.6 02/13/2022    CL 109 (H) 02/13/2022    CO2 28.0 02/13/2022    BUN 14 02/13/2022    CREATININE 0.58 02/13/2022    GLU 100 (H) 02/13/2022    CALCIUM 8.3 (L) 02/13/2022    MG 2.1 02/13/2022    PHOS 4.6 02/13/2022  Lab Results   Component Value Date    BILITOT 0.3 02/12/2022    BILIDIR 0.20 12/04/2021    PROT 6.4 02/12/2022    ALBUMIN 3.4 02/12/2022    ALT 8 (L) 02/12/2022    AST 15 02/12/2022    ALKPHOS 39 (L) 02/12/2022    GGT 13 10/31/2011       Lab Results   Component Value Date    PT 11.7 02/12/2022    INR 1.05 02/12/2022    APTT 24.1 10/10/2020

## 2022-02-13 NOTE — Unmapped (Signed)
Patient awake, continue pain control, minimal relief reported. VS stable. Continue NPO. Right chest CVC line in place, + blood return, labs drawn, flushing without resistance. No BM reported this shift. Patient ambulatory.     Problem: Adult Inpatient Plan of Care  Goal: Plan of Care Review  Outcome: Progressing  Goal: Patient-Specific Goal (Individualized)  Outcome: Progressing  Goal: Absence of Hospital-Acquired Illness or Injury  Outcome: Progressing  Intervention: Identify and Manage Fall Risk  Recent Flowsheet Documentation  Taken 02/12/2022 2142 by Riki Altes, RN  Safety Interventions:   fall reduction program maintained   family at bedside   low bed  Intervention: Prevent Skin Injury  Recent Flowsheet Documentation  Taken 02/12/2022 2210 by Riki Altes, RN  Positioning for Skin: (self) Supine/Back  Skin Protection: adhesive use limited  Taken 02/12/2022 2142 by Riki Altes, RN  Positioning for Skin: (self) Supine/Back  Skin Protection: adhesive use limited  Intervention: Prevent Infection  Recent Flowsheet Documentation  Taken 02/12/2022 2142 by Riki Altes, RN  Infection Prevention: hand hygiene promoted  Goal: Optimal Comfort and Wellbeing  Outcome: Progressing  Goal: Readiness for Transition of Care  Outcome: Progressing  Goal: Rounds/Family Conference  Outcome: Progressing     Problem: Latex Allergy  Goal: Absence of Allergy Symptoms  Outcome: Progressing

## 2022-02-13 NOTE — Unmapped (Signed)
Oncology (MEDO) Progress Note    Assessment & Plan:   [name] [name] is a 21 y.o. 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       Principal Problem:    GI bleeding  Active Problems:    [name]    [name]    Pain    Iron deficiency anemia due to chronic blood loss    History of colectomy  Resolved Problems:    * No resolved hospital problems. *      Active Problems    Concern for GI Bleeding - Acute on Chronic anemia - Iron deficiency Anemia    Patient coming in after notable hemoglobin drop on outpatient labs to 7.6 from baseline of 11 with associated bright red blood per rectum over the last 5 days. Had recent hospitalization for similar presentation, bleeding at the time attributed to anal fissures. As of 12/7, hemoglobin stable at 7.8. Undergoing scope with GI today to further visualize areas of bleeding. Differential includes lower GI bleed from anal fissures vs. Upper GI bleed due to anatomy (ileorectal anastamosis) vs. Bleed iso restarting eliquis.   - Maintain active type and screen  - Maintain adequate IV access with at least 2 large-bore peripheral IVs  - Hold Eliquis in the setting of presumed bleed   - GI consulted, undergoing scope today  - Daily H&H  - Transfuse to keep hemoglobin > 7  - Continue IV PPI BID    Acute on chronic abdominal pain 2/2 desmoid tumors  Patient's baseline pain is LLQ and now presenting with RLQ pulling, tugging pain. CT abdomen pelvis with IV contrast done in the ED does show unchanged size and distribution of multiple mesenteric masses compatible with desmoid tumors. However there is also new R adnexal cyst noted. Patient is diffusely tender to palpation but specifies that RLQ is where pain is worse/different. Patient also reports that she is currently menstruating which may be contributing to the pain.  - Continue oxycodone 10 mg every 4 hours  - IV Dilaudid 0.5 mg IV every 3 hours as needed for breakthrough pain  -- Consider TVUS for further evaluation of R adnexal cyst if pain persists    Severe Malnutrition 2/2 [name] and abdominal desmoid tumors   On previous admission she was discharged with a Corpak and was ultimately transitioned to TPN.  She is currently receiving TPN every day and this is being followed by Dr. [name], outpatient GI.   -- Plan to resume TPN 12/7  - Nutrition consulted for TPN recommendations     [name]/Desmoid Tumors   Follows with Dr. [name].  -Hold sorafenib daily as it can cause bleeding, fistulas     Chronic Problems  Anxiety   -Continue Zyprexa 7.5 mg nightly  - Continue Remeron 7.5 nightly  - Continue gabapentin 200 mg nightly     Hx of Pouchitis   Patient has been on cefdinir for this and was recently decreased the dose by her GI doctor for prophylactic dosing. On last scope in November, noted to not have active pouchitis.  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  - Continue to hold this in the setting of GI bleed per above, will touch base with heme about restarting eliquis due to recurrent bleeding    Issues Impacting Complexity of Management:      Medical Decision Making: Discussed the patient's management and/or test interpretation with Gastroenterology as summarized within this note      Daily Checklist:  Diet: Regular Diet  DVT PPx: Contraindicated - High Risk for Bleeding/Active Bleeding  Electrolytes: Replete Potassium to >/= 3.6 and Magnesium to >/= 1.8  Code Status: Full Code  Dispo: Home    Team Contact Information:   Primary Team: Oncology (MEDO)  Primary Resident: Nyoka Lint, MD, MD  Resident's Pager: [phone] (Oncology Intern - Tower)    Interval History:   Patient admitted overnight for GI bleeding and abdominal pain. Continues to have RLQ abdominal pain this AM. Denies nausea, vomiting.     All other systems were reviewed and are negative except as noted in the HPI    Objective:   Temp:  [36.5 ??C-37.2 ??C] 36.7 ??C  Heart Rate:  [74-102] 90  Resp:  [13-18] 13  BP: (95-117)/(51-75) 95/57  SpO2:  [98 %-100 %] 99 %    Gen: NAD, converses   HENT: atraumatic, normocephalic  Heart: RRR  Lungs: CTAB, no crackles or wheezes  Abdomen: significant RLQ tenderness and diffuse abdominal tenderness  Extremities: No edema

## 2022-02-13 NOTE — Unmapped (Signed)
Gastroenterology (Luminal) Consult Service   Treatment Plan         Recommendations:   [name] [name] is a 21 y.o. female with a PMHx of [name] s/p IPA on TPN for nutritional support who presented to [facility] with acute on chronic anemia. The patient is seen in consultation at the request of Jeannine Boga, MD (Oncology/Hematology (MDE)) for hematochezia  ??  #Acute on chronic anemia  #Hematochezia  Pouchoscopy today again with anal fissures but more significantly with circumferential ulceration at the anastomosis, likely etiology of her bleeding/anemia. This was treated with APC. Would hold eliquis tonight and reengage hematology regarding need for prophylactic DOAC given her history of line associate clot in a prior line. She has now had 2 admission for hematochezia and so risk may outweigh possible benefit of anticoagulation.  - Okay to restart diet  - Hold eliquis   - Would discuss with heme risk vs. Benefit of anticoagulation given her history and recurrent bleeding  - Can continue home therapy for treatment of anal fissure    Thank you for involving [name] in the care of your patient. We will continue to follow along with you.     For questions, contact the on-call fellow for the Gastroenterology (Luminal) Consult Service.

## 2022-02-13 NOTE — Unmapped (Signed)
PARENTERAL NUTRITION CONSULTATION NOTE     Requesting Attending Physician :  Jeannine Boga, MD    Reason for Consult:    [name] [name] is a 21 y.o. female seen in consultation at the request of Dr. Jeannine Boga, MD for evaluation of  parenteral nutrition.    NUTRITION ASSESSMENT     Anthropometric Data:  Height:     Admission weight: 54.4 kg (120 lb)  Last recorded weight: 57.7 kg (127 lb 3.3 oz)  IBW:    Percent IBW:    BMI: Body mass index is 21.17 kg/m??.   Usual Body Weight:  118-120#    Weight history prior to admission:  trending back up     Wt Readings from Last 10 Encounters:   02/12/22 57.7 kg (127 lb 3.3 oz)   02/06/22 55.8 kg (123 lb)   01/29/22 57.4 kg (126 lb 9.6 oz)   01/28/22 53.8 kg (118 lb 8 oz)   01/20/22 51.3 kg (113 lb)   01/02/22 52.3 kg (115 lb 6.4 oz)   12/31/21 52.2 kg (115 lb 1.6 oz)   12/26/21 52.2 kg (115 lb)   12/19/21 50.7 kg (111 lb 12.8 oz)   12/03/21 51.6 kg (113 lb 12.8 oz)        Weight changes this admission:   Last 5 Recorded Weights    02/12/22 1355 02/12/22 2140   Weight: 54.4 kg (120 lb) 57.7 kg (127 lb 3.3 oz)        Nutrition Focused Physical Exam:  Nutrition Focused Physical Exam:                        Nutrition Evaluation  Overall Impressions: Nutrition-Focused Physical Exam not indicated due to lack of malnutrition risk factors. (02/13/22 1028)  Nutrition Designation: Normal weight (BMI 18.50 - 24.99 kg/m2) (02/13/22 1028)      NUTRITIONALLY RELEVANT DATA     Parenteral Nutrition Formula:  Recent TPN Orders (Show up to 1 orders ; newest on the right.)       Start date and time  02/14/2022 0000       Parenteral Nutrition (CENTRAL) [[id number]]       linked to   fat emulsion 20 % with fish oil (SMOFLIPID) infusion 250 mL     Order Status Active     Frequency Continuous        Macro Ingredients    amino acid (CLINISOL SF) 15% 75 g     dextrose 250 g     fat emulsion 20 % with fish oil 250 mL *        Electrolytes    sodium acetate 90 mEq     potassium acetate 20 mEq potassium phosphate 21 mmol     magnesium sulfate dilution 4 mEq     calcium gluconate 10 mEq        Additives    multivitamin, adult injection 10 mL     trace elements (TRALEMENT) Zn-Cu-Mn-Se 1 mL        Medications    thiamine 180 mg        QS Base    sterile water 244.58 mL        Energy Contribution    Proteins 300 kcal     Dextrose 850 kcal     Lipids 500 kcal *     Total 1,650 kcal        Electrolyte Ion Calculated Amount    Sodium 90 mEq  Potassium 50.8 mEq     Calcium 10 mEq     Magnesium 4 mEq     Aluminum --     Phosphate 21 mmol     Chloride --     Acetate 110 mEq     Chloride: Acetate Ratio --        Trace Elements    Copper 0.3 mg     Manganese 55 mcg     Selenium 60 mcg     Zinc 3 mg        Other    Total Amino Acid 75 g     Total Amino Acid/kg 1.3 g/kg     Glucose Infusion Rate 3.01 mg/kg/min     Osmolarity 1,857.39     Volume 1,200 mL     Rate 50 mL/hr     Dosing Weight 57.7 kg     Infusion Site Central     Total Multi-vitamins 10 mL        Admin Instructions      Please infuse using a parenteral nutrition tubing set (non-DEHP) with a 1.2-micron filter. Listed in [place] as Set IV for Parenteral Nutrition # [id number].  central line only           * Lipid contribution from the linked fat emulsion order. Any non-lipid contribution from the associated lipid order is not included.              Labs:   Results in Past 7 Days  Result Component Current Result   Sodium 142 (02/13/2022)   Potassium 3.6 (02/13/2022)   Chloride 109 (H) (02/13/2022)   CO2 28.0 (02/13/2022)   BUN 14 (02/13/2022)   Creatinine 0.58 (02/13/2022)   Glucose 100 (H) (02/13/2022)   Calcium 8.3 (L) (02/13/2022)   Ionized Calcium Not in Time Range   Magnesium 2.1 (02/13/2022)   Phosphorus 4.6 (02/13/2022)   Albumin 3.4 (02/12/2022)   Total Bilirubin 0.3 (02/12/2022)   Bilirubin, Direct Not in Time Range   AST 15 (02/12/2022)   ALT 8 (L) (02/12/2022)   Alkaline Phosphatase 39 (L) (02/12/2022)   Triglycerides Not in Time Range         Nutrition History:   February 13, 2022: Prior to admission:  Visited pt at bedside. No issues w/ home TPN. Uses [facility] - RD is [place] Pound [phone] - awaiting most recent home TPN order.     Allergies, Intolerances, Sensitivities, and/or Cultural/Religious Dietary Restrictions: include IV iron     Current Nutrition:  Parenteral nutrition via Internal Jugular   Placement Date: 01/10/22  Placed by: Vascular Interventional Radiology  Double lumen  Tip located in the  via ultrasound and flouroscopy          Nutritional Needs:   Daily Estimated Nutrient Needs:  Energy: 1450-1740 kcals 25-30 kcal/kg using last recorded weight, 58 kg (02/13/22 1031)]  Protein: 70-87 gm [1.2-1.5 gm/kg using last recorded weight, 58 kg (02/13/22 1031)]  Carbohydrate:   [no restriction]  Fluid: pt requests </= 1200 mL/day total mL [per MD team]      Malnutrition Assessment using AND/ASPEN Clinical Characteristics:    Patient does not meet AND/ASPEN criteria for malnutrition at this time (02/13/22 1028)       GOALS and EVALUATION     Patient to meet 80% or greater of nutritional needs via parenteral nutrition within 5 days.  - New    Motivation, Barriers, and Compliance:  Evaluation of motivation, barriers, and compliance completed. No concerns identified at this  time.     NUTRITION ASSESSMENT     Patient appropriate for parenteral nutrition based on the following ASPEN criteria of inability to advance and tolerate tube feeds at goal.      Discharge Planning:   Monitor for potential discharge needs with multi-disciplinary team.       NUTRITION INTERVENTIONS and RECOMMENDATION     Renew TPN daily   Starting pt at goal, but un-cycled to ensure tolerance of regimen (infused TPN last 12/5  Received home order from [email or web address] [phone]       Follow-Up Parameters:   Monitor daily while on TPN    Nutrition assessment completed by:  Forestine Chute MPH, RD, CNSC, LDN   Pager: [phone]  Phone: [phone]  [email or web address]     I was present during the history and exam. I discussed the findings, assessment and plan with the dietitian and agree with the findings and plan as documented in the resident???s note. Barton Fanny MD

## 2022-02-13 NOTE — Unmapped (Addendum)
[name] [name] is a 21 y.o. 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     GI Bleed, acute on chronic anemia   Patient presented with bright red blood per rectum and notable hemoglobin drop in outpatient labs from 11 to 7.6. She had recent hospitalization for similar presentation where bleeding was attributed to anal fissures. She underwent pouchoscopy with GI on 12/7 which showed bleeding ulcer at ileoanal anastamosis. The GI team used APC to coagulate the area. Patient was restarted on eliquis following the previous hospitalization which may have contributed to this bleed. She received 1 unit of PRBC and hgb recovered appropriately. Eliquis was discontinued (more information below) on discharge, and Hgb was 9.2 on discharge.     Acute on chronic abdominal pain 2/2 desmoid tumors  Patient presented with acute on chronic lower abdominal pain. Patient has baseline LLQ abdominal pain from her desmoid tumors. Patient reports that she takes oxycodone 10 mg every 4 hours as needed for her baseline pain. She has been doing well and able to go up to 12 hours without oxycodone prior to this hospitalization. Her pain on admission was primarily in RLQ. CT abdomen pelvis with IV contrast done in the ED does show unchanged size and distribution of multiple mesenteric masses compatible with desmoid tumors. After GI procedure, patient had significant increase in abdominal pain. Pain was initially thought to be post procedural. However, patient continued to have severe pain requiring IV dilaudid every 3 hours days after GI procedure. Repeat CT A/P showed distal small bowel dilation and proximal collapse, ruling out obstructive process. There were no other significant changes. Inpatient chronic pain was consulted for recommendations regarding weaning dilaudid. They have worked with this patient many times in the past inpatient and outpatient. They recommended increasing oxycodone PRN dose to 15 mg and discontinuing dilaudid. Patient tolerated this change well, and was discharged home with 7 days of oxycodone 15 mg to cover breakthrough pain. She has follow up with pain management on 02/26/22.      Headache, sinus osteomas  Patient had retro-orbital headache for 3 days that started after blood transfusion on 12/7. On 12/10, reported some blurry vision but denies any other neurological symptoms and no focal deficits on exam. CT head 12/10 showed osteomas in R sphenoid and bilateral ethmoid sinuses that may be contributing to pain. CT sinus without contrast show stable osteomas. Patient was evaluated by ENT who will follow up with the patient outpatient. Pain is most likely not related to osteomas per ENT.      Severe Malnutrition 2/2 [name] and abdominal desmoid tumors   On previous admission she was discharged with a Corpak and was ultimately transitioned to TPN.  She is currently receiving TPN every day and this is being followed by Dr. [name], outpatient GI. TPN resumed 12/7 and patient discharged with new TPN orders.      [name]/Desmoid Tumors   Patient has history of Gardner's syndrome/[name] with desmoid tumors. She follows with Dr. [name]. She was on sorafenib which was held and discontinued on discharge as it can cause bleeding and fistulas. Patient has follow up with Dr. [name] on 03/04/22 to discuss future management.      Hx of PICC Line associated clot   She was treated for PICC line (for TPN) associated clot  back in January. Patient was started on eliquis prophylactically as she is on TPN. Hematology was consulted regarding need for eliquis in the setting of recurrent GI bleed and recommended discontinuing eliquis. They recommend symptom directed management. Anxiety   Patient continued on zyprexa 7.5 mg nightly, remeron 7.5 mg nightly, and gabapentin 200 mg nightly during hospitalization.     Hx of Pouchitis   Patient has been on cefdinir for pouchitis. Dose was recently decreased by her GI doctor for prophylactic dosing. On last scope in November, noted to not have active pouchitis. Continued on cefdinir 300 mg daily during hospitalization.      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 Continued clobetasol 0.05% as needed during hospitalization.

## 2022-02-13 NOTE — Unmapped (Signed)
Oncology (MEDO) History & Physical    Assessment & Plan:   [name] [name] is a 21 y.o. female whose presentation is complicated by  [name] [name] (familial adenomatosis polyposis) s/p proctocolectomy w/ ileoanal anastomosis, desmoid tumors (on sorafenib), anemia, severe protein-calorie malnutrition on TPN, and anxiety/nausea that presented to [facility] with concern for GI bleed.      Principal Problem:    GI bleeding  Active Problems:    [name] [name]    [name]    Pain    Iron deficiency anemia due to chronic blood loss    History of colectomy  Resolved Problems:    * No resolved hospital problems. *        Active Problems    Concern for GI Bleeding - Acute on Chronic anemia - Iron deficiency Anemia    Patient coming in after notable hemoglobin drop on outpatient labs to 7.6 with associated bright red blood per rectum over the last 5 days.  Prior to her recent hospitalization in November her approximate baseline was around 11.  Of note, she was recently discharged on 11/15 after an admission for intractable abdominal pain was complicated by concern for GI bleeding.  She underwent EGD and pouch evaluation at the time which showed a solitary ulcer at the ileocolonic anastomosis site and some anal fissures.  Patency capsule and VCE were deferred at the time given concern that the capsule would not pass and her bleeding was ultimately attributed to the fissures.  Fortunately, on admission her hemoglobin is stable at 8.6.  She is hemodynamically stable and mildly tachycardic in the low 100s.  Suspect her bleeding is likely lower source secondary to prior notable fissure and anastomosis ulcer.  She has had no hematemesis or melena (although she does have altered GI anatomy). Suspicion for UGI is low but not completely unreasonable given noted polyps (nonbleeding) on prior EGD in November.   - Maintain active type and screen  - Maintain adequate IV access with at least 2 large-bore peripheral IVs  - Hold Eliquis in the setting of presumed bleed (last dose was 10pm on 12/5)   - We will keep her n.p.o. at midnight  - Consult luminal GI in the morning  - Given relative stability will monitor hemoglobin and hematocrit daily  - Transfuse to keep hemoglobin > 7  - Start IV PPI BID although suspicion for UGI is low still possible to have bleeding from her stomach polyps   - Last Tsat was 13% and could consider iron transfusion  - Obtain blood consent    Chronic abdominal pain 2/2 desmoid tumors   CT abdomen pelvis with IV contrast done in the ED does show unchanged size and distribution of multiple mesenteric masses compatible with desmoid tumors.  Patient is tender to palpation throughout her abdomen but most pronounced in the right upper quadrant and somewhat in the left lower quadrant.  She reports that the pain in her left lower quadrant is typical for her but the right upper quadrant has been hurting a lot more in the last few days.  Nothing notable noted on CT.  Her LFTs are normal.  -Touch base on whether she should be back on hyoscyamine  - Continue oxycodone 10 mg every 4 hours  - IV Dilaudid 0.5 mg IV every 3 hours as needed for breakthrough pain  - It appears patient was about to start buprenorphine but held off on this due to actually improved pain just prior to admission  Severe Malnutrition 2/2 [name] and abdominal desmoid tumors   On previous admission she was discharged with a Corpak and was ultimately transitioned to TPN.  She is currently receiving TPN every day and this is being followed by Dr. [name], outpatient GI.   -Given late arrival resume TPN tomorrow  - Nutrition consulted for TPN recommendations    [name]/Desmoid Tumors   Follows with Dr. Meredith [name].  -Hold sorafenib daily but patient did bring supply from home      Chronic Problems  Anxiety   -Continue Zyprexa 7.5 mg nightly  - Continue Remeron 7.5 nightly  - Continue gabapentin 200 mg nightly    Hx of Pouchitis   Patient has been on cefdinir for this and was recently decreased the dose by her GI doctor for prophylactic dosing.  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  - Continue to hold this in the setting of GI bleed per above    The patient's presentation is complicated by the following clinically significant conditions requiring additional evaluation and treatment: - Malnutrition POA requiring further investigation, treatment, or monitoring  - Metastatic cancer POA requiring further investigation, treatment, or monitoring         Issues Impacting Complexity of Management:  -New start anti-coagulation with high risk of bleeding given recent history of GI bleeding      Medical Decision Making: Discussed the patient's management and/or test interpretation with ED Provider as summarized within this note      Checklist:  Diet: Regular Diet  DVT PPx: Contraindicated - High Risk for Bleeding/Active Bleeding  Code Status: Full Code  Dispo: Patient appropriate for Observation based on expectation of ongoing need for hospitalization less than two midnights and/or low intensity of services provided    Team Contact Information:   Primary Team: Oncology (MEDO)  Primary Resident: [name] Guise, MD  Resident's Pager: [phone] (Oncology Senior Resident)    Chief Concern:   GI bleeding      Subjective:   [name] [name] is a 21 y.o. female with pertinent PMHx of per above presenting with increasing fatigue and worsening anemia.         History obtained by speaking with the patient and mother.       HPI:  Patient was recently discharged on 11/15 after admission for intractable abdominal pain.  That hospital stay was complicated by GI bleed and she underwent EGD/pouch evaluation which showed an ileocolonic ulcer, multiple sessile non-bleeding polyps in the stomach, and anal fissures.  She never required transfusions during that admission.  Following discharge, she reports she was doing quite well and had actually cut down on her pain medicines.  However, starting on 12/1 she began experiencing increased weakness and decrease in appetite.  She is endorsing associated shortness of breath and was so lethargic that her mom notes she was sleeping all day Monday and Tuesday.  She also worked with physical therapy and scored a 7 out of 10 for shortness of breath.  She ultimately had labs drawn at home yesterday morning which showed a hemoglobin drop to 7.6.  They ultimately called her GI doctor and oncologist given this change and were advised to present to  the ED for further management.  I should mention that the patient has had bright red blood per rectum over the last 5 days but denies any hematemesis.    She denies any fevers, chills, vomiting but does have occasional nausea.  She did just start her menstrual cycle today.  Her last Eliquis dose was 10 PM on 12/5.    In the ED, BP was stable although patient was slightly tachycardic up to the 120s. She was afebrile and without oxygen requirement. She did receive a few doses of dilaudid for pain. Labs were significant for a Hgb of 8.6 and Cr 0.42 but otherwise largely unremarkable. CTAP showed known multiple masses consistent with desmoid tumors.       Pertinent Surgical Hx  Past Surgical History:   Procedure Laterality Date    cyroablation      cystis removal      desmoid removal      PR CLOSE ENTEROSTOMY,RESEC+ANAST N/A 10/09/2020    Procedure: ILEOSTOMY TAKEDOWN;  Surgeon: Mickle Asper, MD;  Location: OR [place];  Service: General Surgery    PR COLONOSCOPY W/BIOPSY SINGLE/MULTIPLE N/A 10/27/2012    Procedure: COLONOSCOPY, FLEXIBLE, PROXIMAL TO SPLENIC FLEXURE; WITH BIOPSY, SINGLE OR MULTIPLE;  Surgeon: Shirlyn Goltz Mir, MD; Location: PEDS PROCEDURE ROOM [facility];  Service: Gastroenterology    PR COLONOSCOPY W/BIOPSY SINGLE/MULTIPLE N/A 09/14/2013    Procedure: COLONOSCOPY, FLEXIBLE, PROXIMAL TO SPLENIC FLEXURE; WITH BIOPSY, SINGLE OR MULTIPLE;  Surgeon: Shirlyn Goltz Mir, MD;  Location: PEDS PROCEDURE ROOM [facility];  Service: Gastroenterology    PR COLONOSCOPY W/BIOPSY SINGLE/MULTIPLE N/A 11/08/2014    Procedure: COLONOSCOPY, FLEXIBLE, PROXIMAL TO SPLENIC FLEXURE; WITH BIOPSY, SINGLE OR MULTIPLE;  Surgeon: Arnold Long Mir, MD;  Location: PEDS PROCEDURE ROOM [facility];  Service: Gastroenterology    PR COLONOSCOPY W/BIOPSY SINGLE/MULTIPLE N/A 12/26/2015    Procedure: COLONOSCOPY, FLEXIBLE, PROXIMAL TO SPLENIC FLEXURE; WITH BIOPSY, SINGLE OR MULTIPLE;  Surgeon: Arnold Long Mir, MD;  Location: PEDS PROCEDURE ROOM [facility];  Service: Gastroenterology    PR COLONOSCOPY W/BIOPSY SINGLE/MULTIPLE N/A 09/02/2017    Procedure: COLONOSCOPY, FLEXIBLE, PROXIMAL TO SPLENIC FLEXURE; WITH BIOPSY, SINGLE OR MULTIPLE;  Surgeon: Arnold Long Mir, MD;  Location: PEDS PROCEDURE ROOM [place];  Service: Gastroenterology    PR COLSC FLX W/REMOVAL LESION BY HOT BX FORCEPS N/A 08/27/2016    Procedure: COLONOSCOPY, FLEXIBLE, PROXIMAL TO SPLENIC FLEXURE; W/REMOVAL TUMOR/POLYP/OTHER LESION, HOT BX FORCEP/CAUTE;  Surgeon: Arnold Long Mir, MD;  Location: PEDS PROCEDURE ROOM [facility];  Service: Gastroenterology    PR COLSC FLX W/RMVL OF TUMOR POLYP LESION SNARE TQ N/A 02/25/2019    Procedure: COLONOSCOPY FLEX; W/REMOV TUMOR/LES BY SNARE;  Surgeon: Helyn Numbers, MD;  Location: GI PROCEDURES [place] [facility];  Service: Gastroenterology    PR COLSC FLX W/RMVL OF TUMOR POLYP LESION SNARE TQ N/A 03/13/2020    Procedure: COLONOSCOPY FLEX; W/REMOV TUMOR/LES BY SNARE;  Surgeon: Helyn Numbers, MD;  Location: GI PROCEDURES [place] [facility];  Service: Gastroenterology    PR EXC SKIN BENIG 2.1-3 CM TRUNK,ARM,LEG Right 02/25/2017    Procedure: EXCISION, BENIGN LESION INCLUDE MARGINS, EXCEPT SKIN TAG, LEGS; EXCISED DIAMETER 2.1 TO 3.0 CM;  Surgeon: Clarene Duke, MD;  Location: CHILDRENS OR [facility];  Service: Plastics    PR EXC SKIN BENIG 3.1-4 CM TRUNK,ARM,LEG Right 02/25/2017    Procedure: EXCISION, BENIGN LESION INCLUDE MARGINS, EXCEPT SKIN TAG, ARMS; EXCISED DIAMETER 3.1 TO 4.0 CM;  Surgeon: Clarene Duke, MD;  Location: CHILDRENS OR [facility];  Service: Plastics    PR EXC  SKIN BENIG >4 CM FACE,FACIAL Right 02/25/2017    Procedure: EXCISION, OTHER BENIGN LES INCLUD MARGIN, FACE/EARS/EYELIDS/NOSE/LIPS/MUCOUS MEMBRANE; EXCISED DIAM >4.0 CM;  Surgeon: Clarene Duke, MD;  Location: CHILDRENS OR [facility];  Service: Plastics    PR EXC TUMOR SOFT TISSUE LEG/ANKLE SUBQ 3+CM Right 08/05/2019    Procedure: EXCISION, TUMOR, SOFT TISSUE OF LEG OR ANKLE AREA, SUBCUTANEOUS; 3 CM OR GREATER;  Surgeon: Arsenio Katz, MD;  Location: MAIN OR [place];  Service: Plastics    PR EXC TUMOR SOFT TISSUE LEG/ANKLE SUBQ <3CM Right 08/05/2019    Procedure: EXCISION, TUMOR, SOFT TISSUE OF LEG OR ANKLE AREA, SUBCUTANEOUS; LESS THAN 3 CM;  Surgeon: Arsenio Katz, MD;  Location: MAIN OR [facility];  Service: Plastics    PR LAP, SURG PROCTECTOMY W J-POUCH N/A 08/10/2020    Procedure: ROBOTIC ASSISTED LAPAROSCOPIC PROCTOCOLECTOMY, ILEAL J POUCH, WITH OSTOMY;  Surgeon: Mickle Asper, MD;  Location: OR [place];  Service: General Surgery    PR NDSC EVAL INTSTINAL POUCH DX W/COLLJ SPEC SPX N/A 01/23/2021    Procedure: ENDO EVAL SM INTEST POUCH; DX;  Surgeon: Modena Nunnery, MD;  Location: [facility] PROCEDURES [place] [facility];  Service: Gastroenterology    PR NDSC EVAL INTSTINAL POUCH DX W/COLLJ SPEC SPX N/A 08/27/2021    Procedure: ENDO EVAL SM INTEST POUCH; DX;  Surgeon: Hunt Oris, MD;  Location: [facility]URES [facility];  Service: Gastroenterology    PR NDSC EVAL INTSTINAL POUCH DX W/COLLJ SPEC SPX N/A 12/09/2021    Procedure: ENDO EVAL SM INTEST POUCH; DX;  Surgeon: Vidal Schwalbe, MD;  Location: GI PROCEDURES [facility];  Service: Gastroenterology    PR NDSC EVAL INTSTINAL POUCH W/BX SINGLE/MULTIPLE N/A 01/20/2022    Procedure: ENDOSCOPIC EVAL OF SMALL INTESTINAL POUCH; DIAGNOSTIC, No biopsies;  Surgeon: Andrey Farmer, MD;  Location: GI PROCEDURES [facility];  Service: Gastroenterology    PR UNLISTED PROCEDURE SMALL INTESTINE  01/23/2021    Procedure: UNLISTED PROCEDURE, SMALL INTESTINE;  Surgeon: Modena Nunnery, MD;  Location: GI PROCEDURES [place] [facility];  Service: Gastroenterology    PR UPPER GI ENDOSCOPY,BIOPSY N/A 10/27/2012    Procedure: UGI ENDOSCOPY; WITH BIOPSY, SINGLE OR MULTIPLE;  Surgeon: Shirlyn Goltz Mir, MD;  Location: PEDS PROCEDURE ROOM [facility];  Service: Gastroenterology    PR UPPER GI ENDOSCOPY,BIOPSY N/A 09/14/2013    Procedure: UGI ENDOSCOPY; WITH BIOPSY, SINGLE OR MULTIPLE;  Surgeon: Shirlyn Goltz Mir, MD;  Location: PEDS PROCEDURE ROOM [facility];  Service: Gastroenterology    PR UPPER GI ENDOSCOPY,BIOPSY N/A 11/08/2014    Procedure: UGI ENDOSCOPY; WITH BIOPSY, SINGLE OR MULTIPLE;  Surgeon: Arnold Long Mir, MD;  Location: PEDS PROCEDURE ROOM [facility];  Service: Gastroenterology    PR UPPER GI ENDOSCOPY,BIOPSY N/A 12/26/2015    Procedure: UGI ENDOSCOPY; WITH BIOPSY, SINGLE OR MULTIPLE;  Surgeon: Arnold Long Mir, MD;  Location: PEDS PROCEDURE ROOM [facility];  Service: Gastroenterology    PR UPPER GI ENDOSCOPY,BIOPSY N/A 08/27/2016    Procedure: UGI ENDOSCOPY; WITH BIOPSY, SINGLE OR MULTIPLE;  Surgeon: Arnold Long Mir, MD;  Location: PEDS PROCEDURE ROOM [facility];  Service: Gastroenterology    PR UPPER GI ENDOSCOPY,BIOPSY N/A 09/02/2017    Procedure: UGI ENDOSCOPY; WITH BIOPSY, SINGLE OR MULTIPLE;  Surgeon: Arnold Long Mir, MD;  Location: PEDS PROCEDURE ROOM [facility];  Service: Gastroenterology    PR UPPER GI ENDOSCOPY,BIOPSY N/A 03/13/2020    Procedure: UGI ENDOSCOPY; WITH BIOPSY, SINGLE OR MULTIPLE;  Surgeon: Helyn Numbers, MD;  Location: [facility] PROCED[facility] [place] [facility];  Service: Gastroenterology  PR UPPER GI ENDOSCOPY,BIOPSY N/A 09/05/2021    Procedure: UGI ENDOSCOPY; WITH BIOPSY, SINGLE OR MULTIPLE;  Surgeon: Wendall Papa, MD;  [facility]: GI [facility]URES [facility];  Service: Gastroenterology    PR UPPER GI ENDOSCOPY,DIAGNOSIS N/A 01/20/2022    Procedure: UGI ENDO, INCLUDE ESOPHAGUS, STOMACH, & DUODENUM &/OR JEJUNUM; DX W/WO COLLECTION SPECIMN, BY BRUSH OR WASH;  Surgeon: Andrey Farmer, MD;  Location: GI PROCEDURES [facility];  Service: Gastroenterology    TUMOR REMOVAL      multiple-head, neck, back, hand, right flank, multiple         Pertinent Family Hx  Family History   Problem Relation Age of Onset    No Known Problems Mother     No Known Problems Father     No Known Problems Sister     No Known Problems Brother     Stroke Maternal Grandmother     Other Maternal Grandmother         benign lesions of liver and pancreas, further details unknown    Cancer Maternal Grandmother     Diabetes Maternal Grandmother     Hypertension Maternal Grandmother     Thyroid disease Maternal Grandmother     Arthritis Maternal Grandfather     Asthma Maternal Grandfather     COPD Paternal Grandmother         Deceased    Miscarriages / Stillbirths Paternal Grandmother     Alcohol abuse Paternal Grandfather         Deceased    No Known Problems Maternal Aunt     No Known Problems Maternal Uncle     No Known Problems Paternal Aunt     No Known Problems Paternal Uncle     Anesthesia problems Neg Hx     Broken bones Neg Hx     Cancer Neg Hx     Clotting disorder Neg Hx     Collagen disease Neg Hx     Diabetes Neg Hx     Dislocations Neg Hx     Fibromyalgia Neg Hx     Gout Neg Hx     Hemophilia Neg Hx     Osteoporosis Neg Hx     Rheumatologic disease Neg Hx     Scoliosis Neg Hx     Severe sprains Neg Hx     Sickle cell anemia Neg Hx     Spinal Compression Fracture Neg Hx     Melanoma Neg Hx     Basal cell carcinoma Neg Hx     Squamous cell carcinoma Neg Hx          Pertinent Social Hx     Lives at home with her parents.    Allergies  Adhesive tape-silicones; Ferrlecit [sodium ferric gluconat-sucrose]; Levofloxacin; Methylnaltrexone; Neomycin; Papaya; Morphine; Zosyn [piperacillin-tazobactam]; Compazine [prochlorperazine]; and Latex, natural rubber    I reviewed the Medication List. The current list is Accurate  Prior to Admission medications    Medication Dose, Route, Frequency   acetaminophen (TYLENOL) 500 MG tablet 1,000 mg, Oral, 2 times a day (standard)   apixaban (ELIQUIS) 2.5 mg Tab 2.5 mg, Oral, 2 times a day (standard)   baclofen (LIORESAL) 5 mg Tab tablet 5-10 mg, Oral, 2 times a day PRN   buprenorphine 10 mcg/hour PTWK transdermal patch 1 patch, Transdermal, Every 7 days   cefdinir (OMNICEF) 300 MG capsule 300 mg, Oral, 2 times a day (standard)   clobetasoL (TEMOVATE) 0.05 % ointment Topical, 2 times a day (standard),  To stubborn/thick rashes on hands/feet ears until clear/smooth. Restart as needed   COURIERED MED OR SUPPLY [id number], SORAFENIB 200MG  TABLET   diclofenac sodium (VOLTAREN) 1 % gel 2 g, Topical, 4 times a day   diphenhydrAMINE (BENADRYL) 50 mg capsule 50 mg, Oral, Every 6 hours PRN   famotidine (PEPCID) 20 MG tablet 20 mg, Oral, Nightly   gabapentin (NEURONTIN) 100 MG capsule 200 mg, Oral, Nightly   hydrocortisone (ANUSOL-HC) 25 mg suppository 25 mg, Rectal, 2 times a day PRN   hydrocortisone 2.5 % cream Apply 1 application. topically daily as needed.   hydrOXYzine (ATARAX) 50 MG tablet 50 mg, Oral, Every 6 hours PRN   hyoscyamine (LEVSIN) 0.125 mg tablet 0.125 mg, Oral, Every 4 hours PRN   lidocaine (LIDODERM) 5 % patch 1 patch, Transdermal, Every 24 hours, Apply to affected area for 12 hours only each day (then remove patch)   menthol-zinc oxide (CALMOSEPTINE) 0.44-20.6 % Oint Topical, 4 times a day PRN   mirtazapine (REMERON) 7.5 MG tablet 7.5 mg, Oral, Nightly   naloxone (NARCAN) 4 mg nasal spray One spray in either nostril once for known/suspected opioid overdose. May repeat every 2-3 minutes in alternating nostril til EMS arrives   NIFEdipine 0.3% lidocaine 1.5% in petrolatum ointment Topical, 2 times a day   OLANZapine (ZYPREXA) 7.5 MG tablet 7.5 mg, Oral, Nightly   ondansetron (ZOFRAN-ODT) 8 MG disintegrating tablet 8 mg, Oral, Every 12 hours PRN   oxyCODONE (ROXICODONE) 10 mg immediate release tablet 10 mg, Oral, Every 4 hours PRN, Fill on or after: 03/01/22   pimecrolimus (ELIDEL) 1 % cream Topical, 2 times a day (standard), To face as needed for rash   silver sulfaDIAZINE (SILVADENE) 1 % cream Topical, Daily (standard), To burn on leg   SLEEPY BUTTER Swish and spit 10 mL 4 times a day as needed.   SORAfenib (NEXAVAR) 200 mg tablet 200 mg, Oral, Daily (standard)   triamcinolone (KENALOG) 0.1 % cream Topical, 2 times a day (standard)   zinc oxide 10 % Crea 1 application., Topical, Daily PRN       Designated Healthcare Decision Maker:  Ms. [name] currently has decisional capacity for healthcare decision-making and is able to designate a surrogate healthcare decision maker. Ms. Idelle Leech designated healthcare decision maker(s) is/are Trey Paula and Lavetta Nielsen (the patient's  parents ) as denoted by stated patient preference.    Objective:   Physical Exam:  Temp:  [36.4 ??C (97.5 ??F)-36.7 ??C (98.1 ??F)] 36.7 ??C (98 ??F)  Heart Rate:  [95-153] 100  Resp:  [14-18] 14  BP: (104-135)/(51-84) 117/66  SpO2:  [98 %-99 %] 98 %    Gen: NAD, converses   Eyes: Sclera anicteric, EOMI grossly normal   HENT: Atraumatic, normocephalic  Neck: Trachea midline  Heart: RRR  Lungs: CTAB, no crackles or wheezes  Abdomen: Firm tumor palpated in the left lower quadrant, tenderness throughout her abdomen but mainly pronounced in the right upper and left lower quadrant, no peritoneal signs, no guarding or rebound tenderness  Extremities: No edema  Neuro: Grossly symmetric, non-focal    Skin:  No rashes, lesions on clothed exam  Psych: Alert, oriented     [name] Guise, MD MPH   PGY-3 - Internal Medicine

## 2022-02-14 DIAGNOSIS — R1084 Generalized abdominal pain: Secondary | ICD-10-CM | POA: Diagnosis not present

## 2022-02-14 DIAGNOSIS — E860 Dehydration: Secondary | ICD-10-CM | POA: Diagnosis not present

## 2022-02-14 DIAGNOSIS — Z932 Ileostomy status: Secondary | ICD-10-CM | POA: Diagnosis not present

## 2022-02-14 DIAGNOSIS — R112 Nausea with vomiting, unspecified: Secondary | ICD-10-CM | POA: Diagnosis not present

## 2022-02-14 LAB — BASIC METABOLIC PANEL
ANION GAP: 5 mmol/L (ref 5–14)
BLOOD UREA NITROGEN: 12 mg/dL (ref 9–23)
BUN / CREAT RATIO: 27
CALCIUM: 8.3 mg/dL — ABNORMAL LOW (ref 8.7–10.4)
CHLORIDE: 109 mmol/L — ABNORMAL HIGH (ref 98–107)
CO2: 28 mmol/L (ref 20.0–31.0)
CREATININE: 0.45 mg/dL — ABNORMAL LOW
EGFR CKD-EPI (2021) FEMALE: >90 mL/min/{1.73_m2} (ref >=60)
GLUCOSE RANDOM: 110 mg/dL (ref 70–179)
POTASSIUM: 3.8 mmol/L (ref 3.4–4.8)
SODIUM: 142 mmol/L (ref 135–145)

## 2022-02-14 LAB — CBC W/ AUTO DIFF
BASOPHILS ABSOLUTE COUNT: 0 10*9/L (ref 0.0–0.1)
BASOPHILS RELATIVE PERCENT: 0.5 %
EOSINOPHILS ABSOLUTE COUNT: 0.1 10*9/L (ref 0.0–0.5)
EOSINOPHILS RELATIVE PERCENT: 2.3 %
HEMATOCRIT: 21.3 % — ABNORMAL LOW (ref 34.0–44.0)
HEMOGLOBIN: 6.9 g/dL — ABNORMAL LOW (ref 11.3–14.9)
LYMPHOCYTES ABSOLUTE COUNT: 1.3 10*9/L (ref 1.1–3.6)
LYMPHOCYTES RELATIVE PERCENT: 27.6 %
MEAN CORPUSCULAR HEMOGLOBIN CONC: 32.4 g/dL (ref 32.0–36.0)
MEAN CORPUSCULAR HEMOGLOBIN: 28 pg (ref 25.9–32.4)
MEAN CORPUSCULAR VOLUME: 86.5 fL (ref 77.6–95.7)
MEAN PLATELET VOLUME: 9.4 fL (ref 6.8–10.7)
MONOCYTES ABSOLUTE COUNT: 0.5 10*9/L (ref 0.3–0.8)
MONOCYTES RELATIVE PERCENT: 9.7 %
NEUTROPHILS ABSOLUTE COUNT: 2.9 10*9/L (ref 1.8–7.8)
NEUTROPHILS RELATIVE PERCENT: 59.9 %
PLATELET COUNT: 191 10*9/L (ref 150–450)
RED BLOOD CELL COUNT: 2.46 10*12/L — ABNORMAL LOW (ref 3.95–5.13)
RED CELL DISTRIBUTION WIDTH: 14.7 % (ref 12.2–15.2)
WBC ADJUSTED: 4.9 10*9/L (ref 3.6–11.2)

## 2022-02-14 LAB — HEMOGLOBIN AND HEMATOCRIT, BLOOD
HEMATOCRIT: 28.1 % — ABNORMAL LOW (ref 34.0–44.0)
HEMOGLOBIN: 9.2 g/dL — ABNORMAL LOW (ref 11.3–14.9)

## 2022-02-14 LAB — PHOSPHORUS: PHOSPHORUS: 4.2 mg/dL (ref 2.4–5.1)

## 2022-02-14 LAB — MAGNESIUM: MAGNESIUM: 2.1 mg/dL (ref 1.6–2.6)

## 2022-02-14 MED ADMIN — oxyCODONE (ROXICODONE) immediate release tablet 10 mg: 10 mg | ORAL | @ 02:00:00 | Stop: 2022-02-26

## 2022-02-14 MED ADMIN — oxyCODONE (ROXICODONE) immediate release tablet 10 mg: 10 mg | ORAL | @ 16:00:00 | Stop: 2022-02-14

## 2022-02-14 MED ADMIN — HYDROmorphone (PF) (DILAUDID) injection 1 mg: 1 mg | INTRAVENOUS | @ 12:00:00 | Stop: 2022-02-15

## 2022-02-14 MED ADMIN — gabapentin (NEURONTIN) capsule 200 mg: 200 mg | ORAL | @ 02:00:00

## 2022-02-14 MED ADMIN — heparin, porcine (PF) 100 unit/mL injection 200 Units: 200 [IU] | INTRAVENOUS | @ 14:00:00

## 2022-02-14 MED ADMIN — HYDROmorphone (PF) (DILAUDID) injection 1 mg: 1 mg | INTRAVENOUS | @ 05:00:00 | Stop: 2022-02-15

## 2022-02-14 MED ADMIN — HYDROmorphone (PF) (DILAUDID) injection 1 mg: 1 mg | INTRAVENOUS | @ 08:00:00 | Stop: 2022-02-15

## 2022-02-14 MED ADMIN — ammonium lactate (LAC-HYDRIN) 12 % lotion 1 Application: 1 | TOPICAL | @ 08:00:00

## 2022-02-14 MED ADMIN — clobetasoL (TEMOVATE) 0.05 % ointment: TOPICAL | @ 14:00:00

## 2022-02-14 MED ADMIN — HYDROmorphone (PF) (DILAUDID) injection 1 mg: 1 mg | INTRAVENOUS | @ 22:00:00 | Stop: 2022-02-15

## 2022-02-14 MED ADMIN — clobetasoL (TEMOVATE) 0.05 % ointment: TOPICAL | @ 02:00:00

## 2022-02-14 MED ADMIN — OLANZapine (ZyPREXA) tablet 7.5 mg: 7.5 mg | ORAL | @ 02:00:00

## 2022-02-14 MED ADMIN — mirtazapine (REMERON) tablet 7.5 mg: 7.5 mg | ORAL | @ 02:00:00

## 2022-02-14 MED ADMIN — oxyCODONE (ROXICODONE) immediate release tablet 10 mg: 10 mg | ORAL | @ 14:00:00 | Stop: 2022-02-26

## 2022-02-14 MED ADMIN — HYDROmorphone (PF) (DILAUDID) injection 1 mg: 1 mg | INTRAVENOUS | @ 18:00:00 | Stop: 2022-02-15

## 2022-02-14 MED ADMIN — oxyCODONE (ROXICODONE) immediate release tablet 10 mg: 10 mg | ORAL | @ 20:00:00 | Stop: 2022-02-26

## 2022-02-14 MED ADMIN — acetaminophen (TYLENOL) tablet 650 mg: 650 mg | ORAL | @ 14:00:00

## 2022-02-14 MED ADMIN — pantoprazole (Protonix) injection 40 mg: 40 mg | INTRAVENOUS | @ 14:00:00

## 2022-02-14 MED ADMIN — Parenteral Nutrition (CENTRAL): INTRAVENOUS | @ 06:00:00 | Stop: 2022-02-15

## 2022-02-14 MED ADMIN — ondansetron (ZOFRAN) injection 4 mg: 4 mg | INTRAVENOUS | @ 12:00:00

## 2022-02-14 MED ADMIN — HYDROmorphone (PF) (DILAUDID) injection 1 mg: 1 mg | INTRAVENOUS | Stop: 2022-02-15

## 2022-02-14 MED ADMIN — fat emulsion 20 % with fish oil (SMOFLIPID) infusion 250 mL: 250 mL | INTRAVENOUS | @ 06:00:00 | Stop: 2022-02-14

## 2022-02-14 MED ADMIN — sodium chloride (NS) 0.9 % infusion: INTRAVENOUS | @ 16:00:00

## 2022-02-14 MED ADMIN — acetaminophen (TYLENOL) tablet 650 mg: 650 mg | ORAL | @ 02:00:00

## 2022-02-14 MED ADMIN — ammonium lactate (LAC-HYDRIN) 12 % lotion 1 Application: 1 | TOPICAL | @ 14:00:00

## 2022-02-14 MED ADMIN — cefdinir (OMNICEF) capsule 300 mg: 300 mg | ORAL | @ 14:00:00 | Stop: 2022-02-27

## 2022-02-14 NOTE — Unmapped (Signed)
Oncology (MEDO) Progress Note    Assessment & Plan:   [name] [name] is a 21 y.o. female whose presentation is complicated by [name] [name] (familial adenomatosis polyposis) s/p proctocolectomy w/ ileoanal anastomosis, desmoid [name] (on sorafenib), anemia, severe protein-calorie malnutrition on TPN, and anxiety/nausea who presented to [facility] with concern for GI bleed.       Principal Problem:    GI bleeding  Active Problems:    [name] [name]    [name]    Pain    Iron deficiency anemia due to chronic blood loss    History of colectomy  Resolved Problems:    * No resolved hospital problems. *      Active Problems    Concern for GI Bleeding - Acute on Chronic anemia - Iron deficiency Anemia    Patient coming in after notable hemoglobin drop on outpatient labs to 7.6 from baseline of 11 with associated bright red blood per rectum over the last 5 days. Had recent hospitalization for similar presentation, bleeding at the time attributed to anal fissures. Underwent endoscopy 12/7 with GI which showed bleeding ulcer at ileoanal anastamosis. This may have been exacerbated because patient was on eliquis. On 12/8 AM, hgb was 6.9 requiring 1 unit of PRBC. Per patient, she has had significant menstrual bleeding which is also a source of the blood loss. No bowel movements since GI procedure.  -- Transfuse 1 unit PRBC to keep hemoglobin >7  - Maintain active type and screen  - Maintain adequate IV access with at least 2 large-bore peripheral IVs  - Hold Eliquis in the setting of presumed bleed, will be held on discharge per hematology  - GI following  - Daily H&H  - Continue IV PPI BID    Acute on chronic abdominal pain 2/2 desmoid [name]  Patient's baseline pain is LLQ and now presenting with RLQ pulling, tugging pain. CT abdomen pelvis with IV contrast done in the ED does show unchanged size and distribution of multiple mesenteric masses compatible with desmoid [name]. However there is also new R adnexal cyst noted. Patient is diffusely tender to palpation but specifies that RLQ is where pain is worse/different. Patient also reports that she is currently menstruating which may be contributing to the pain.  - Continue oxycodone 10 mg every 4 hours  - IV Dilaudid 1 mg every 3 hours as needed for breakthrough pain  -- Consider TVUS for further evaluation of R adnexal cyst if pain persists, patient would like to hold off on TVUS at this time     Severe Malnutrition 2/2 [name] [name] and abdominal desmoid [name]   On previous admission she was discharged with a Corpak and was ultimately transitioned to TPN.  She is currently receiving TPN every day and this is being followed by Dr. [name], outpatient GI. TPN resumed 12/7.  - Nutrition consulted for TPN recommendations     [name] [name]   Follows with Dr. [name].  - Hold sorafenib daily as it can cause bleeding, fistulas  -- Will defer restarting to outpatient oncologist    Hx of PICC Line associated clot   She was treated for this back in January and given recent reinsertion of PICC line for TPN was started on a prophylactic dose of Eliquis.  - Continue to hold this in the setting of GI bleed per above  -- Hematology consulted 12/8, recommend holding eliquis in setting of recurrent GI bleeds as there is no current clot and eliquis was being used prophylactically  Chronic Problems  Anxiety   - Continue Zyprexa 7.5 mg nightly  - Continue Remeron 7.5 nightly  - Continue gabapentin 200 mg nightly     Hx of Pouchitis   Patient has been on cefdinir for this and was recently decreased the dose by her GI doctor for prophylactic dosing. On last scope in November, noted to not have active pouchitis.  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 for [name] [name]: recurring with re-initiation of sorafenib  -Continue clobetasol 0.05%    Issues Impacting Complexity of Management:      Medical Decision Making: Discussed the patient's management and/or test interpretation with Gastroenterology as summarized within this note      Daily Checklist:  Diet: Regular Diet  DVT PPx: Contraindicated - High Risk for Bleeding/Active Bleeding  Electrolytes: Replete Potassium to >/= 3.6 and Magnesium to >/= 1.8  Code Status: Full Code  Dispo: Home    Team Contact Information:   Primary Team: Oncology (MEDO)  Primary Resident: Nyoka Lint, MD, MD  Resident's Pager: [phone] (Oncology Intern - Tower)    Interval History:   Patient reports significant lower abdominal pain that is worse following GI procedure. Currently requiring IV dilaudid 1 mg PRN to help control pain. Patient also reports significant menstrual blood loss. Has a pad in place. Has not had any bowel movements since GI procedure.    All other systems were reviewed and are negative except as noted in the HPI    Objective:   Temp:  [36.6 ??C (97.8 ??F)-37.3 ??C (99.1 ??F)] 36.7 ??C (98.1 ??F)  Heart Rate:  [73-103] 96  SpO2 Pulse:  [72-100] 82  Resp:  [11-23] 19  BP: (90-125)/(45-80) 111/69  SpO2:  [98 %-100 %] 100 %    Gen: NAD, converses   HENT: atraumatic, normocephalic  Heart: RRR  Lungs: CTAB, no crackles or wheezes  Abdomen: diffuse abdominal tenderness with significant pain on palpation in bilateral lower quadrants  Extremities: No edema

## 2022-02-14 NOTE — Unmapped (Signed)
Hematology Consult Note    Requesting Attending Physician :  Jeannine Boga, MD  Service Requesting Consult : Oncology/Hematology (MDE)  Reason for Consult:     Assessment: [name] [name] is an 21 y.o. 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 Consulted for anticoagulation management in the setting of GI bleed.    # Sentinel thrombosis: R arm PICC-associated SVT 03/2021   on low-dose eliquis prophylaxis iso central line since 01/15/2022   # recurrent GI bleeds with ulceration ileoanal anastomosis  - initial R arm 02/2021 PICC placement c/b pain, swelling, tenderness.  At 03/13/2021 IR clinic visit, had POCUS with ?DVT. PICC removed and replaced with R IJ 03/14/2021. 03/19/2021 [name] with SVT. Received 3 months therapeutic anticoagulation with apixaban  - She since has gotten only IJ lines (1st placed 03/14/2021, removed 05/20/2021; 2nd placed 01/15/2022 through present) after the initial PICC placement. Given that internal jugular vein is a physically larger vessel compared to brachial, these lines have lower risk of clotting; one meta-analysis of 11 studies found that PICCs were associated with a 2.5-fold higher risk of DVT than centrally-inserted venous catheters.  - Separately, given her inability to safely tolerate anticoagulation prophylaxis with recurrent GI bleeds, agree with GI to stop apixaban prophylaxis moving forward.    # normocytic anemia, acute, Hgb nadir 6.9  - likely acute on chronic blood loss anemia with GI pouchoscopy 02/13/2022 demonstrating 10mm circumferential anastomotic ulcer with oozing bleeding s/p coagulation  - s/p 1u pRBC transfusion    Recommendations:   - discontinue anticoagulation prophylaxis given she now has R internal jugular access and thus is at lower risk compared to sentinel event with PICC. She also has had recurrent GI bleeds with known ulcers near ileoanal anastomosis.  - adopt symptom-guided approach to prompt future ultrasound evaluations for additional thrombotic events  - transfuse for Hgb > 7; recent 11/30 folate/B12 WNL, can recheck and repeat iron panel, ferritin    We will sign off at this time.  Please page with any questions.  Seen and discussed with Dr. Isaiah Serge and primary team.    Freddy Finner, MD  HemOnc Fellow  Benign Hematology Consult Service  p [phone]    ==    HPI: [name] [name] is an 21 y.o. 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 Consulted for anticoagulation management in the setting of GI bleed.    She originally had a R arm double lumen PICC placed 02/22/2021 at [facility] for TPN. She then developed persistent pain in R arm near PICC insertion site. She was seen in IR clinic 03/13/2021. A point-of-care limited ultrasound of the right upper extremity was performed, and revealed a right brachial vein PICC with  noncompressible vein, compatible with R brachial vein thrombosis. She had R arm PICC removed and placement of 19F double lumen tunneled powerline in R internal jugular vein on 03/14/2021. She was seen in outpatient oncology clinic 03/19/2021, where a RUE [name] was ordered.      PVL venous duplex 03/19/2021:  Technologist Emilee Carroll/MD Ke[place]  Right  Minimal residual thrombus observed at an isolated segment of the basilic vein. Evidence of  continued obstruction is visualized in the basilic vein at proximal upper arm. All other venous findings in the upper extremity are within normal limits.    Per note, duplex demonstrates DVT. Will initiate anticoagulation given line and symptomatic thrombosis, long discussion of risks and benefits. Apixaban 5mg  BID (no loading dose) and will treat for 3 mos or less if line comes out sooner. Apixaban 5mg  BID started 03/20/2021.    03/26/2021 Palliative care note: Pain in area of clot, causing swelling in arm and neck, will experience both deep throbbing achy pain as well as sharp, shooting.    She saw Mitchell Heir 03/29/2021, who noted that:  RUE PVL on 03/19/21 which showed continued obstruction in the basilic vein (residual thrombus at isolated segment).  VTE risk factors include: 1) Prolonged Hospital Stay, 2) PICC line.  Started on therapy with Eliquis 5mg  BID which has been well tolerated with exception of persistent uterine bleeding.Marland KitchenMarland KitchenSince her initial DVT was provoked by a PICC line and the PICC was removed, anticoagulation is not recommended. However, for symptomatic persistent superficial vein thrombus in the basilic vein, 6 weeks of anticoagulation could be considered.  Since she is symptomatic with swelling and pain and does have persistent risk factors for VTE including a new catheter recently placed, we discussed treatment for 6 weeks.  However, we also reviewed that with her history of GI bleeding and abdominal disease and her ongoing uterine bleeding since starting Eliquis, it would be very reasonable to reduce her dose to 2.5mg  BID x6 weeks as is standard dosing for superficial vein thrombosis. Reviewed that she remains at significant risk of bleeding on anticoagulation.    A PVL venous duplex 05/01/2021:  Technologist Helmut Muster Scott/MD [place]  Right  Evidence of chronic obstruction visualized in the basilic vein at proximal upper arm. Compared to the previous ultrasound on 03/19/2021, the basilic vein thrombus appears to be more chronic on today's exam...   Abnormalities consistent with the sequela of a prior venous obstructive process,  with findings that appear chronic/long standing in nature are identified in the  basilic vein. Basilic vein thrombus is more chronic on this exam.    She stopped TPN per Dr. [name] 04/25/2021.     She saw Mitchell Heir again 05/03/2021, She remains on Eliquis 2.5mg  BID.  She had a repeat PVL which demonstrated chronic changes.Marland KitchenMarland KitchenWill continue 2.5mg  BID at least until the catheter is removed.    Her line was removed 05/20/2021 by IR, and she stopped Eliquis ~05/27/2021.    She saw Mitchell Heir again 06/28/2021 who noted she completed 6 weeks of Eliquis 2.5mg  BID and then continued until her catheter was removed.  She has now had her central line removed and has recovered from her superficial vein thrombus.  No further anticoagulation planned at this time.    She was admitted 12/05/2021 - 12/20/2021 for malnutrition iso poor PO 2/2 abdominal pain. She had endoscopy 12/09/2021 demonstrating two ulcers (3mm at ileocolonic anastamsosis and 3mm j-pouch efferent limb).    Saw Mitchell Heir 01/02/2022 who stated Recommend prophylactic anticoagulation with Eliquis 2.5mg  BID (if taking PO) or lovenox 40mg  subcutaneously (if not taking PO) if she has a new central line.    She was admitted 01/10/2022 - 01/22/2022 with hematochezia, and started TPN after central line placement 01/10/2022. Was on PPX lovenox, but switched to 2.5mg  apixaban 11/8 instead of lovenox 40 for ppx given preference for oral, comfort. A 01/20/2022 upper endoscopy and puchoscopy was performed, notable for anal fissure and  1 ulcer in ileocolonic anastomosis, non-bleeding, no stigmata of recent bleeding were seen. A RBA held with family, resumed apixaban 11/15.    Patient admitted 02/12/2022 for hematochezia and reportedly dropping Hgb on outpatient labs to 7.6 (unable to see this in chart) vs recent baseline 8, Hgb 12 in 01/2022; ED repeat Hgb 8.6. She had pouchoscopy 02/13/2022 with GI notable for 10mm circumferential anastomotic ulcer that was oozing; biopsied and treated with argon plasma coagulation (APC). GI appropriately notes 2 recent admissions with hematochezia concerning for ongoing anticoagulation.    Review of Systems: A 12 system review of systems was negative except as noted in HPI.    Medical History:  Past Medical History:   Diagnosis Date    Abdominal pain     Acid reflux     occas    Anesthesia complication     itching, shaking, coldness; last few surgeries have gone much better    Cancer (CMS-HCC)     Cataract of right eye     COVID-19 virus infection 01/2019    Cyst of thyroid determined by ultrasound     monitoring    [name]     2 right forearm, 1 left thigh, 1 right scapula, 1 under left clavicle; multiple    Difficult intravenous access     [name] (familial adenomatous polyposis)     [name]     Gastric polyps     History of chemotherapy     last treatment approx 05/2019    History of colon polyps     History of COVID-19 01/2019    Iron deficiency anemia due to chronic blood loss     received iron infusion 11-2019    PONV (postoperative nausea and vomiting)     Rectal bleeding     Syncopal episodes     especially if becoming dehydrated    Surgical History:  Past Surgical History:   Procedure Laterality Date    cyroablation      cystis removal      desmoid removal      PR CLOSE ENTEROSTOMY,RESEC+ANAST N/A 10/09/2020    Procedure: ILEOSTOMY TAKEDOWN;  Surgeon: Mickle Asper, MD;  Location: OR [place];  Service: General Surgery    PR COLONOSCOPY W/BIOPSY SINGLE/MULTIPLE N/A 10/27/2012    Procedure: COLONOSCOPY, FLEXIBLE, PROXIMAL TO SPLENIC FLEXURE; WITH BIOPSY, SINGLE OR MULTIPLE;  Surgeon: Shirlyn Goltz Mir, MD;  Location: PEDS PROCEDURE ROOM [place];  Service: Gastroenterology    PR COLONOSCOPY W/BIOPSY SINGLE/MULTIPLE N/A 09/14/2013    Procedure: COLONOSCOPY, FLEXIBLE, PROXIMAL TO SPLENIC FLEXURE; WITH BIOPSY, SINGLE OR MULTIPLE;  Surgeon: Shirlyn Goltz Mir, MD;  Location: PEDS PROCEDURE ROOM [employer];  Service: Gastroenterology    PR COLONOSCOPY W/BIOPSY SINGLE/MULTIPLE N/A 11/08/2014    Procedure: COLONOSCOPY, FLEXIBLE, PROXIMAL TO SPLENIC FLEXURE; WITH BIOPSY, SINGLE OR MULTIPLE;  Surgeon: Arnold Long Mir, MD;  Location: PEDS PROCEDURE ROOM [facility];  Service: Gastroenterology    PR COLONOSCOPY W/BIOPSY SINGLE/MULTIPLE N/A 12/26/2015    Procedure: COLONOSCOPY, FLEXIBLE, PROXIMAL TO SPLENIC FLEXURE; WITH BIOPSY, SINGLE OR MULTIPLE;  Surgeon: Arnold Long Mir, MD;  Location: PEDS PROCEDURE ROOM [facility];  Service: Gastroenterology    PR COLONOSCOPY W/BIOPSY SINGLE/MULTIPLE N/A 09/02/2017    Procedure: COLONOSCOPY, FLEXIBLE, PROXIMAL TO SPLENIC FLEXURE; WITH BIOPSY, SINGLE OR MULTIPLE;  Surgeon: Arnold Long Mir, MD;  Location: PEDS PROCEDURE ROOM [place];  Service: Gastroenterology    PR COLSC FLX W/REMOVAL LESION BY HOT BX FORCEPS N/A 08/27/2016    Procedure: COLONOSCOPY, FLEXIBLE, PROXIMAL TO SPLENIC FLEXURE;  W/REMOVAL TUMOR/POLYP/OTHER LESION, HOT BX FORCEP/CAUTE;  Surgeon: Arnold Long Mir, MD;  Location: PEDS PROCEDURE ROOM [facility];  Service: Gastroenterology    PR COLSC FLX W/RMVL OF TUMOR POLYP LESION SNARE TQ N/A 02/25/2019    Procedure: COLONOSCOPY FLEX; W/REMOV TUMOR/LES BY SNARE;  Surgeon: Helyn Numbers, MD;  Location: GI PROCEDURES [place] [facility];  Service: Gastroenterology    PR COLSC FLX W/RMVL OF TUMOR POLYP LESION SNARE TQ N/A 03/13/2020    Procedure: COLONOSCOPY FLEX; W/REMOV TUMOR/LES BY SNARE;  Surgeon: Helyn Numbers, MD;  Location: GI PROCEDURES [place] [facility];  Service: Gastroenterology    PR EXC SKIN BENIG 2.1-3 CM TRUNK,ARM,LEG Right 02/25/2017    Procedure: EXCISION, BENIGN LESION INCLUDE MARGINS, EXCEPT SKIN TAG, LEGS; EXCISED DIAMETER 2.1 TO 3.0 CM;  Surgeon: Clarene Duke, MD;  Location: CHILDRENS OR [facility];  Service: Plastics    PR EXC SKIN BENIG 3.1-4 CM TRUNK,ARM,LEG Right 02/25/2017    Procedure: EXCISION, BENIGN LESION INCLUDE MARGINS, EXCEPT SKIN TAG, ARMS; EXCISED DIAMETER 3.1 TO 4.0 CM;  Surgeon: Clarene Duke, MD;  Location: CHILDRENS OR [facility] [facility];  Service: Plastics    PR EXC SKIN BENIG >4 CM FACE,FACIAL Right 02/25/2017    Procedure: EXCISION, OTHER BENIGN LES INCLUD MARGIN, FACE/EARS/EYELIDS/NOSE/LIPS/MUCOUS MEMBRANE; EXCISED DIAM >4.0 CM;  Surgeon: Clarene Duke, MD;  Location: CHILDRENS OR [facility];  Service: Plastics    PR EXC TUMOR SOFT TISSUE LEG/ANKLE SUBQ 3+CM Right 08/05/2019    Procedure: EXCISION, TUMOR, SOFT TISSUE OF LEG OR ANKLE AREA, SUBCUTANEOUS; 3 CM OR GREATER;  Surgeon: Arsenio Katz, MD;  Location: MAIN OR [place];  Service: Plastics    PR EXC TUMOR SOFT TISSUE LEG/ANKLE SUBQ <3CM Right 08/05/2019    Procedure: EXCISION, TUMOR, SOFT TISSUE OF LEG OR ANKLE AREA, SUBCUTANEOUS; LESS THAN 3 CM;  Surgeon: Arsenio Katz, MD;  Location: MAIN OR [facility];  Service: Plastics    PR LAP, SURG PROCTECTOMY W J-POUCH N/A 08/10/2020    Procedure: ROBOTIC ASSISTED LAPAROSCOPIC PROCTOCOLECTOMY, ILEAL J POUCH, WITH OSTOMY;  Surgeon: Mickle Asper, MD;  Location: OR [place];  Service: General Surgery    PR NDSC EVAL INTSTINAL POUCH DX W/COLLJ SPEC SPX N/A 01/23/2021    Procedure: ENDO EVAL SM INTEST POUCH; DX;  Surgeon: Modena Nunnery, MD;  [facility]: [facility] PROCED[facility] [place] [facility];  Service: Gastroenterology    PR NDSC EVAL INTSTINAL POUCH DX W/COLLJ SPEC SPX N/A 08/27/2021    Procedure: ENDO EVAL SM INTEST POUCH; DX;  Surgeon: Hunt Oris, MD;  [facility];  Service: Gastroenterology    PR NDSC EVAL INTSTINAL POUCH DX W/COLLJ SPEC SPX N/A 12/09/2021    Procedure: ENDO EVAL SM INTEST POUCH; DX;  Surgeon: Vidal Schwalbe, MD;  Location: [facility] PROC[facility]URES [facility];  Service: Gastroenterology    PR NDSC EVAL INTSTINAL POUCH W/BX SINGLE/MULTIPLE N/A 01/20/2022    Procedure: ENDOSCOPIC EVAL OF SMALL INTESTINAL POUCH; DIAGNOSTIC, No biopsies;  Surgeon: Andrey Farmer, MD;  [facility];  Service: Gastroenterology    PR UNLISTED PROCEDURE SMALL INTESTINE  01/23/2021    Procedure: UNLISTED PROCEDURE, SMALL INTESTINE;  Surgeon: Modena Nunnery, MD;  Location: [facility] PROCED[facility] [place] [facility];  Service: Gastroenterology    PR UPPER GI ENDOSCOPY,BIOPSY N/A 10/27/2012    Procedure: UGI ENDOSCOPY; WITH BIOPSY, SINGLE OR MULTIPLE;  Surgeon: Shirlyn Goltz Mir, MD;  Location: PEDS PROCEDURE ROOM [facility];  Service: Gastroenterology    PR UPPER GI ENDOSCOPY,BIOPSY N/A 09/14/2013    Procedure: UGI  ENDOSCOPY; WITH BIOPSY, SINGLE OR MULTIPLE;  Surgeon: Shirlyn Goltz Mir, MD;  Location: PEDS PROCEDURE ROOM [facility];  Service: Gastroenterology    PR UPPER GI ENDOSCOPY,BIOPSY N/A 11/08/2014    Procedure: UGI ENDOSCOPY; WITH BIOPSY, SINGLE OR MULTIPLE;  Surgeon: Arnold Long Mir, MD;  Location: PEDS PROCEDURE ROOM [facility];  Service: Gastroenterology    PR UPPER GI ENDOSCOPY,BIOPSY N/A 12/26/2015    Procedure: UGI ENDOSCOPY; WITH BIOPSY, SINGLE OR MULTIPLE;  Surgeon: Arnold Long Mir, MD;  Location: PEDS PROCEDURE ROOM [facility];  Service: Gastroenterology    PR UPPER GI ENDOSCOPY,BIOPSY N/A 08/27/2016    Procedure: UGI ENDOSCOPY; WITH BIOPSY, SINGLE OR MULTIPLE;  Surgeon: Arnold Long Mir, MD;  Location: PEDS PROCEDURE ROOM [facility];  Service: Gastroenterology    PR UPPER GI ENDOSCOPY,BIOPSY N/A 09/02/2017    Procedure: UGI ENDOSCOPY; WITH BIOPSY, SINGLE OR MULTIPLE;  Surgeon: Arnold Long Mir, MD;  Location: PEDS PROCEDURE ROOM [facility];  Service: Gastroenterology    PR UPPER GI ENDOSCOPY,BIOPSY N/A 03/13/2020    Procedure: UGI ENDOSCOPY; WITH BIOPSY, SINGLE OR MULTIPLE;  Surgeon: Helyn Numbers, MD;  Location: GI PROCEDURES [place] [facility];  Service: Gastroenterology    PR UPPER GI ENDOSCOPY,BIOPSY N/A 09/05/2021    Procedure: UGI ENDOSCOPY; WITH BIOPSY, SINGLE OR MULTIPLE;  Surgeon: Wendall Papa, MD;  [facility]: [facility];  Service: Gastroenterology    PR UPPER GI ENDOSCOPY,DIAGNOSIS N/A 01/20/2022    Procedure: UGI ENDO, INCLUDE ESOPHAGUS, STOMACH, & DUODENUM &/OR JEJUNUM; DX W/WO COLLECTION SPECIMN, BY BRUSH OR WASH;  Surgeon: Andrey Farmer, MD;  [facility]: [facility]URES [facility];  Service: Gastroenterology    TUMOR REMOVAL multiple-head, neck, back, hand, right flank, multiple      Social History:  Social History     Socioeconomic History    Marital status: Single   Tobacco Use    Smoking status: Never     Passive exposure: Past    Smokeless tobacco: Never   Vaping Use    Vaping Use: Never used   Substance and Sexual Activity    Alcohol use: Never    Drug use: Never    Sexual activity: Never   Other Topics Concern    Do you use sunscreen? Yes    Tanning bed use? No    Are you easily burned? No    Excessive sun exposure? No    Blistering sunburns? 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     Social Determinants of Health     Financial Resource Strain: Low Risk  (08/30/2021)    Overall Financial Resource Strain (CARDIA)     Difficulty of Paying Living Expenses: Not very hard   Food Insecurity: No Food Insecurity (08/30/2021)    Hunger Vital Sign     Worried About Running Out of Food in the Last Year: Never true     Ran Out of Food in the Last Year: Never true   Transportation Needs: No Transportation Needs (08/30/2021)    PRAPARE - Therapist, art (Medical): No     Lack of Transportation (Non-Medical): No    Family History:  family history includes Alcohol abuse in her paternal grandfather; Arthritis in her maternal grandfather; Asthma in her maternal grandfather; COPD in her paternal grandmother; Cancer in her maternal grandmother; Diabetes in her maternal grandmother; Hypertension in her maternal grandmother; Miscarriages / [place] in her paternal  grandmother; No Known Problems in her brother, father, maternal aunt, maternal uncle, mother, paternal aunt, paternal uncle, and sister; Other in her maternal grandmother; Stroke in her maternal grandmother; Thyroid disease in her maternal grandmother.     Allergies: is allergic to adhesive tape-silicones; ferrlecit [sodium ferric gluconat-sucrose]; levofloxacin; methylnaltrexone; neomycin; papaya; morphine; zosyn [piperacillin-tazobactam]; compazine [prochlorperazine]; and latex, natural rubber.    Medications:   Meds:   ammonium lactate  1 Application Topical BID    cefdinir  300 mg Oral Daily    clobetasoL   Topical BID    gabapentin  200 mg Oral Nightly    heparin, porcine (PF)  200 Units Intravenous Q MWF    mirtazapine  7.5 mg Oral Nightly    OLANZapine  7.5 mg Oral Nightly    pantoprazole (Protonix) intravenous solution  40 mg Intravenous Daily     Continuous Infusions:   emollient combination no.92      Parenteral Nutrition (CENTRAL) 50 mL/hr at 02/14/22 0103    And    fat emulsion 20 % with fish oil 250 mL (02/14/22 0103)    [START ON 02/15/2022] Parenteral Nutrition (CENTRAL)      And    [START ON 02/15/2022] fat emulsion 20 % with fish oil      lactated Ringers 10 mL/hr (02/13/22 1450)    sodium chloride       PRN Meds:.acetaminophen, baclofen, emollient combination no.92, HYDROmorphone, ondansetron **OR** ondansetron, oxyCODONE    Objective:   Vitals: Temp:  [36.6 ??C (97.8 ??F)-37.3 ??C (99.1 ??F)] 36.7 ??C (98.1 ??F)  Heart Rate:  [73-103] 96  SpO2 Pulse:  [72-100] 82  Resp:  [11-23] 19  BP: (90-125)/(45-80) 111/69  MAP (mmHg):  [59-82] 82  SpO2:  [98 %-100 %] 100 %  BMI (Calculated):  [20.47] 20.47    General: Young comfortable appearing adult in no acute distress  HEENT: PERRLA, normocephalic atraumatic  CV: RRR, normal s1s2 no m/r/g  Pulm: Clear to auscultation bilaterally.   Abdominal: nontender, nondistended. +BS.  Extremities: WWP. No edema.  Neuro: AOx3, strength and sensation intact                          No focal deficits      Test Results  Recent Labs     02/12/22  2216 02/13/22  0530 02/13/22  1843 02/14/22  0635   WBC 5.6 5.7  --  4.9   NEUTROABS 3.2 3.1  --  2.9   HGB 7.8* 7.5* 7.3* 6.9*   PLT 217 214  --  191     Imaging: Radiology studies were personally reviewed

## 2022-02-15 LAB — CBC
HEMATOCRIT: 27.5 % — ABNORMAL LOW (ref 34.0–44.0)
HEMOGLOBIN: 8.9 g/dL — ABNORMAL LOW (ref 11.3–14.9)
MEAN CORPUSCULAR HEMOGLOBIN CONC: 32.3 g/dL (ref 32.0–36.0)
MEAN CORPUSCULAR HEMOGLOBIN: 28.1 pg (ref 25.9–32.4)
MEAN CORPUSCULAR VOLUME: 87.2 fL (ref 77.6–95.7)
MEAN PLATELET VOLUME: 9.2 fL (ref 6.8–10.7)
PLATELET COUNT: 233 10*9/L (ref 150–450)
RED BLOOD CELL COUNT: 3.15 10*12/L — ABNORMAL LOW (ref 3.95–5.13)
RED CELL DISTRIBUTION WIDTH: 15.4 % — ABNORMAL HIGH (ref 12.2–15.2)
WBC ADJUSTED: 6.7 10*9/L (ref 3.6–11.2)

## 2022-02-15 LAB — BASIC METABOLIC PANEL
ANION GAP: 5 mmol/L (ref 5–14)
BLOOD UREA NITROGEN: 12 mg/dL (ref 9–23)
BUN / CREAT RATIO: 27
CALCIUM: 8.7 mg/dL (ref 8.7–10.4)
CHLORIDE: 107 mmol/L (ref 98–107)
CO2: 29 mmol/L (ref 20.0–31.0)
CREATININE: 0.45 mg/dL — ABNORMAL LOW
EGFR CKD-EPI (2021) FEMALE: >90 mL/min/{1.73_m2} (ref >=60)
GLUCOSE RANDOM: 113 mg/dL (ref 70–179)
POTASSIUM: 3.7 mmol/L (ref 3.4–4.8)
SODIUM: 141 mmol/L (ref 135–145)

## 2022-02-15 LAB — PHOSPHORUS: PHOSPHORUS: 4 mg/dL (ref 2.4–5.1)

## 2022-02-15 LAB — MAGNESIUM: MAGNESIUM: 1.9 mg/dL (ref 1.6–2.6)

## 2022-02-15 MED ADMIN — fat emulsion 20 % with fish oil (SMOFLIPID) infusion 250 mL: 250 mL | INTRAVENOUS | @ 06:00:00 | Stop: 2022-02-15

## 2022-02-15 MED ADMIN — HYDROmorphone (PF) (DILAUDID) injection 1 mg: 1 mg | INTRAVENOUS | @ 03:00:00 | Stop: 2022-02-14

## 2022-02-15 MED ADMIN — HYDROmorphone (PF) (DILAUDID) injection 1 mg: 1 mg | INTRAVENOUS | @ 06:00:00 | Stop: 2022-02-15

## 2022-02-15 MED ADMIN — oxyCODONE (ROXICODONE) immediate release tablet 10 mg: 10 mg | ORAL | @ 12:00:00 | Stop: 2022-02-26

## 2022-02-15 MED ADMIN — ammonium lactate (LAC-HYDRIN) 12 % lotion 1 Application: 1 | TOPICAL | @ 15:00:00

## 2022-02-15 MED ADMIN — acetaminophen (TYLENOL) tablet 650 mg: 650 mg | ORAL | @ 19:00:00

## 2022-02-15 MED ADMIN — clobetasoL (TEMOVATE) 0.05 % ointment: TOPICAL | @ 15:00:00

## 2022-02-15 MED ADMIN — gabapentin (NEURONTIN) capsule 200 mg: 200 mg | ORAL | @ 03:00:00

## 2022-02-15 MED ADMIN — acetaminophen (TYLENOL) tablet 650 mg: 650 mg | ORAL | @ 03:00:00

## 2022-02-15 MED ADMIN — ondansetron (ZOFRAN) injection 4 mg: 4 mg | INTRAVENOUS | @ 23:00:00

## 2022-02-15 MED ADMIN — HYDROmorphone (PF) (DILAUDID) injection 1 mg: 1 mg | INTRAVENOUS | @ 02:00:00 | Stop: 2022-02-15

## 2022-02-15 MED ADMIN — OLANZapine (ZyPREXA) tablet 7.5 mg: 7.5 mg | ORAL | @ 04:00:00

## 2022-02-15 MED ADMIN — Parenteral Nutrition (CENTRAL): INTRAVENOUS | @ 06:00:00 | Stop: 2022-02-16

## 2022-02-15 MED ADMIN — lactated Ringers infusion: 75 mL/h | INTRAVENOUS | @ 16:00:00 | Stop: 2022-02-15

## 2022-02-15 MED ADMIN — mirtazapine (REMERON) tablet 7.5 mg: 7.5 mg | ORAL | @ 04:00:00

## 2022-02-15 MED ADMIN — HYDROmorphone (PF) (DILAUDID) injection 1 mg: 1 mg | INTRAVENOUS | @ 13:00:00 | Stop: 2022-02-15

## 2022-02-15 MED ADMIN — oxyCODONE (ROXICODONE) immediate release tablet 10 mg: 10 mg | ORAL | @ 01:00:00 | Stop: 2022-02-26

## 2022-02-15 MED ADMIN — HYDROmorphone (PF) (DILAUDID) injection 1 mg: 1 mg | INTRAVENOUS | @ 23:00:00 | Stop: 2022-02-15

## 2022-02-15 MED ADMIN — HYDROmorphone (PF) (DILAUDID) injection 1 mg: 1 mg | INTRAVENOUS | @ 19:00:00 | Stop: 2022-02-15

## 2022-02-15 MED ADMIN — pantoprazole (Protonix) injection 40 mg: 40 mg | INTRAVENOUS | @ 15:00:00

## 2022-02-15 MED ADMIN — HYDROmorphone (PF) (DILAUDID) injection 1 mg: 1 mg | INTRAVENOUS | @ 09:00:00 | Stop: 2022-02-15

## 2022-02-15 MED ADMIN — lactated ringers bolus 500 mL: 500 mL | INTRAVENOUS | @ 15:00:00 | Stop: 2022-02-15

## 2022-02-15 MED ADMIN — cefdinir (OMNICEF) capsule 300 mg: 300 mg | ORAL | @ 15:00:00 | Stop: 2022-02-27

## 2022-02-15 MED ADMIN — oxyCODONE (ROXICODONE) immediate release tablet 10 mg: 10 mg | ORAL | @ 18:00:00 | Stop: 2022-02-26

## 2022-02-15 MED ADMIN — lactated Ringers infusion: 75 mL/h | INTRAVENOUS | @ 23:00:00 | Stop: 2022-02-15

## 2022-02-15 MED ADMIN — acetaminophen (TYLENOL) tablet 650 mg: 650 mg | ORAL | @ 13:00:00

## 2022-02-15 MED ADMIN — HYDROmorphone (PF) (DILAUDID) injection 1 mg: 1 mg | INTRAVENOUS | @ 16:00:00 | Stop: 2022-02-15

## 2022-02-15 NOTE — Unmapped (Signed)
Oncology (MEDO) Progress Note    Assessment & Plan:   [name] [name] is a 21 y.o. female whose presentation is complicated by [name] [name] Syndrome (familial adenomatosis polyposis) s/p proctocolectomy w/ ileoanal anastomosis, desmoid tumors (on sorafenib), anemia, severe protein-calorie malnutrition on TPN, and anxiety/nausea who presented to [facility] with concern for GI bleed.       Principal Problem:    GI bleeding  Active Problems:    [name]    [name]    Pain    Iron deficiency anemia due to chronic blood loss    History of colectomy  Resolved Problems:    * No resolved hospital problems. *      Active Problems    Concern for GI Bleeding - Acute on Chronic anemia - Iron deficiency Anemia  Patient coming in after notable hemoglobin drop on outpatient labs to 7.6 from baseline of 11 with associated bright red blood per rectum over the last 5 days. Had recent hospitalization for similar presentation, bleeding at the time attributed to anal fissures. Underwent endoscopy 12/7 with GI which showed bleeding ulcer at ileoanal anastamosis. This may have been exacerbated because patient was on eliquis. On 12/8 AM, hgb was 6.9 requiring 1 unit of PRBC. Responded appropriately with repeat hgb of 9.2. Per patient, she has had significant menstrual bleeding which is also a source of the blood loss.She has had multiple Bms since GI procedure with no melena or blood.  - Maintain active type and screen  - Discontinue eliquis per hematology recs  - Daily H&H  - Continue IV PPI BID    Acute on chronic abdominal pain 2/2 desmoid tumors  Patient's baseline pain is LLQ and now presenting with RLQ pulling, tugging pain. CT abdomen pelvis with IV contrast done in the ED does show unchanged size and distribution of multiple mesenteric masses compatible with desmoid tumors. However there is also new R adnexal cyst noted. Patient is diffusely tender to palpation but specifies that RLQ is where pain is worse/different. Patient also reports that she is currently menstruating which may be contributing to the pain. On 12/9, patient reports continued severe RLQ abdominal pain requiring IV dilaudid Q3 hours. Most likely continued pain in the post GI procedure setting and concurrent menstrual cramps.  -- Continue tylenol 650 mg Q6h prn   - Continue oxycodone 10 mg every 4 hours PRN  - IV Dilaudid 1 mg every 3 hours as needed for breakthrough pain  -- Consider TVUS for further evaluation of R adnexal cyst if pain persists, patient would like to hold off on TVUS at this time    Severe Malnutrition 2/2 [name] and abdominal desmoid tumors   On previous admission she was discharged with a Corpak and was ultimately transitioned to TPN.  She is currently receiving TPN every day and this is being followed by Dr. [name], outpatient GI. TPN resumed 12/7.  - Nutrition consulted for TPN recommendations     [name]/Desmoid Tumors   Follows with Dr. [name].  - Hold sorafenib daily as it can cause bleeding, fistulas  -- Will defer restarting to outpatient oncologist    Hx of PICC Line associated clot   She was treated for this back in January and given recent reinsertion of PICC line for TPN was started on a prophylactic dose of Eliquis.  - Continue to hold this in the setting of GI bleed per above  -- Hematology consulted 12/8, recommend holding eliquis in setting of recurrent GI bleeds as  there is no current clot and eliquis was being used prophylactically     Chronic Problems  Anxiety   - Continue Zyprexa 7.5 mg nightly  - Continue Remeron 7.5 nightly  - Continue gabapentin 200 mg nightly     Hx of Pouchitis   Patient has been on cefdinir for this and was recently decreased the dose by her GI doctor for prophylactic dosing. On last scope in November, noted to not have active pouchitis.  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: recurring with re-initiation of sorafenib  -Continue clobetasol 0.05%    Issues Impacting Complexity of Management:      Medical Decision Making: Discussed the patient's management and/or test interpretation with Gastroenterology as summarized within this note      Daily Checklist:  Diet: Regular Diet  DVT PPx: Contraindicated - High Risk for Bleeding/Active Bleeding  Electrolytes: Replete Potassium to >/= 3.6 and Magnesium to >/= 1.8  Code Status: Full Code  Dispo: Home    Team Contact Information:   Primary Team: Oncology (MEDO)  Primary Resident: Nyoka Lint, MD, MD  Resident's Pager: [phone] (Oncology Intern - Tower)    Interval History:   Patient reports severe RLQ pain that is worse than yesterday. It is still a deep, tugging, aching pain. Per her mother, she usually has the most severe pain two days after any procedure. Patient also reports headache that is improving with tylenol, oxycodone, fluids.     All other systems were reviewed and are negative except as noted in the HPI    Objective:   Temp:  [36.7 ??C (98.1 ??F)-37.3 ??C (99.1 ??F)] 37.1 ??C (98.8 ??F)  Heart Rate:  [92-145] 92  Resp:  [16-18] 18  BP: (105-122)/(58-71) 107/58  SpO2:  [97 %-98 %] 97 %    Gen: NAD, converses   HENT: atraumatic, normocephalic  Heart: RRR  Lungs: CTAB, no crackles or wheezes  Abdomen: diffuse abdominal tenderness with significant pain on palpation in bilateral lower quadrants R>L  Extremities: No edema

## 2022-02-15 NOTE — Unmapped (Signed)
Patient voiced that her pain was not controlled and since s/p blood transfusion she has been having a bad headache and eye pain. Provider notified, one time order placed and given with good effect. TPN progressing via Porta- cath. Patient remains alert and oriented x4, with vital signs stable and mother at bedside. Plan of care ongoing will continue to monitor.  Problem: Adult Inpatient Plan of Care  Goal: Plan of Care Review  Outcome: Ongoing - Unchanged  Flowsheets (Taken 02/15/2022 0246)  Progress: improving  Plan of Care Reviewed With:   patient   parent  Goal: Patient-Specific Goal (Individualized)  Outcome: Ongoing - Unchanged  Goal: Absence of Hospital-Acquired Illness or Injury  Outcome: Ongoing - Unchanged  Intervention: Prevent Skin Injury  Recent Flowsheet Documentation  Taken 02/15/2022 0100 by Janene Harvey, RN  Positioning for Skin: Supine/Back  Device Skin Pressure Protection: absorbent pad utilized/changed  Skin Protection: incontinence pads utilized  Goal: Optimal Comfort and Wellbeing  Outcome: Ongoing - Unchanged  Goal: Readiness for Transition of Care  Outcome: Ongoing - Unchanged  Goal: Rounds/Family Conference  Outcome: Ongoing - Unchanged     Problem: Latex Allergy  Goal: Absence of Allergy Symptoms  Outcome: Ongoing - Unchanged     Problem: Pain Acute  Goal: Optimal Pain Control and Function  Outcome: Ongoing - Unchanged     Problem: Malnutrition  Goal: Improved Nutritional Intake  Outcome: Ongoing - Unchanged

## 2022-02-16 LAB — CBC
HEMATOCRIT: 25.3 % — ABNORMAL LOW (ref 34.0–44.0)
HEMOGLOBIN: 8.5 g/dL — ABNORMAL LOW (ref 11.3–14.9)
MEAN CORPUSCULAR HEMOGLOBIN CONC: 33.6 g/dL (ref 32.0–36.0)
MEAN CORPUSCULAR HEMOGLOBIN: 28.8 pg (ref 25.9–32.4)
MEAN CORPUSCULAR VOLUME: 85.8 fL (ref 77.6–95.7)
MEAN PLATELET VOLUME: 9.2 fL (ref 6.8–10.7)
PLATELET COUNT: 217 10*9/L (ref 150–450)
RED BLOOD CELL COUNT: 2.95 10*12/L — ABNORMAL LOW (ref 3.95–5.13)
RED CELL DISTRIBUTION WIDTH: 15 % (ref 12.2–15.2)
WBC ADJUSTED: 6.1 10*9/L (ref 3.6–11.2)

## 2022-02-16 MED ADMIN — OLANZapine (ZyPREXA) tablet 7.5 mg: 7.5 mg | ORAL | @ 04:00:00

## 2022-02-16 MED ADMIN — lactated Ringers infusion: 10 mL/h | INTRAVENOUS | @ 12:00:00 | Stop: 2022-02-16

## 2022-02-16 MED ADMIN — oxyCODONE (ROXICODONE) immediate release tablet 10 mg: 10 mg | ORAL | @ 14:00:00 | Stop: 2022-02-26

## 2022-02-16 MED ADMIN — iohexoL (OMNIPAQUE) 350 mg iodine/mL solution 100 mL: 100 mL | INTRAVENOUS | @ 22:00:00 | Stop: 2022-02-16

## 2022-02-16 MED ADMIN — pantoprazole (Protonix) injection 40 mg: 40 mg | INTRAVENOUS | @ 14:00:00

## 2022-02-16 MED ADMIN — ondansetron (ZOFRAN) injection 4 mg: 4 mg | INTRAVENOUS | @ 15:00:00

## 2022-02-16 MED ADMIN — HYDROmorphone (PF) (DILAUDID) injection 1 mg: 1 mg | INTRAVENOUS | @ 15:00:00 | Stop: 2022-03-01

## 2022-02-16 MED ADMIN — acetaminophen (TYLENOL) tablet 1,000 mg: 1000 mg | ORAL | @ 14:00:00

## 2022-02-16 MED ADMIN — HYDROmorphone (PF) (DILAUDID) injection 1 mg: 1 mg | INTRAVENOUS | @ 19:00:00 | Stop: 2022-03-01

## 2022-02-16 MED ADMIN — clobetasoL (TEMOVATE) 0.05 % ointment: TOPICAL | @ 02:00:00

## 2022-02-16 MED ADMIN — ammonium lactate (LAC-HYDRIN) 12 % lotion 1 Application: 1 | TOPICAL | @ 02:00:00

## 2022-02-16 MED ADMIN — acetaminophen (TYLENOL) tablet 650 mg: 650 mg | ORAL | @ 02:00:00

## 2022-02-16 MED ADMIN — gabapentin (NEURONTIN) capsule 200 mg: 200 mg | ORAL | @ 04:00:00

## 2022-02-16 MED ADMIN — HYDROmorphone (PF) (DILAUDID) injection 1 mg: 1 mg | INTRAVENOUS | @ 22:00:00 | Stop: 2022-03-01

## 2022-02-16 MED ADMIN — HYDROmorphone (PF) (DILAUDID) injection 1 mg: 1 mg | INTRAVENOUS | @ 02:00:00 | Stop: 2022-03-01

## 2022-02-16 MED ADMIN — HYDROmorphone (PF) (DILAUDID) injection 1 mg: 1 mg | INTRAVENOUS | @ 09:00:00 | Stop: 2022-03-01

## 2022-02-16 MED ADMIN — mirtazapine (REMERON) tablet 7.5 mg: 7.5 mg | ORAL | @ 04:00:00

## 2022-02-16 MED ADMIN — HYDROmorphone (PF) (DILAUDID) injection 1 mg: 1 mg | INTRAVENOUS | @ 11:00:00 | Stop: 2022-03-01

## 2022-02-16 MED ADMIN — oxyCODONE (ROXICODONE) immediate release tablet 10 mg: 10 mg | ORAL | @ 19:00:00 | Stop: 2022-02-26

## 2022-02-16 MED ADMIN — cefdinir (OMNICEF) capsule 300 mg: 300 mg | ORAL | @ 14:00:00 | Stop: 2022-02-27

## 2022-02-16 MED ADMIN — Parenteral Nutrition (CENTRAL): INTRAVENOUS | @ 05:00:00 | Stop: 2022-02-17

## 2022-02-16 MED ADMIN — clobetasoL (TEMOVATE) 0.05 % ointment: TOPICAL | @ 14:00:00

## 2022-02-16 MED ADMIN — fat emulsion 20 % with fish oil (SMOFLIPID) infusion 250 mL: 250 mL | INTRAVENOUS | @ 05:00:00 | Stop: 2022-02-16

## 2022-02-16 MED ADMIN — HYDROmorphone (PF) (DILAUDID) injection 1 mg: 1 mg | INTRAVENOUS | @ 05:00:00 | Stop: 2022-03-01

## 2022-02-16 MED ADMIN — oxyCODONE (ROXICODONE) immediate release tablet 5 mg: 5 mg | ORAL | @ 21:00:00 | Stop: 2022-02-16

## 2022-02-16 MED ADMIN — ammonium lactate (LAC-HYDRIN) 12 % lotion 1 Application: 1 | TOPICAL | @ 14:00:00

## 2022-02-16 MED ADMIN — oxyCODONE (ROXICODONE) immediate release tablet 10 mg: 10 mg | ORAL | @ 01:00:00 | Stop: 2022-02-26

## 2022-02-16 NOTE — Unmapped (Signed)
Problem: Adult Inpatient Plan of Care  Goal: Plan of Care Review  Outcome: Progressing  Goal: Patient-Specific Goal (Individualized)  Outcome: Progressing  Goal: Absence of Hospital-Acquired Illness or Injury  Outcome: Progressing  Intervention: Identify and Manage Fall Risk  Recent Flowsheet Documentation  Taken 02/16/2022 0200 by Sabino Snipes, RN  Safety Interventions:   environmental modification   fall reduction program maintained   lighting adjusted for tasks/safety   low bed   nonskid shoes/slippers when out of bed  Taken 02/16/2022 0000 by Sabino Snipes, RN  Safety Interventions:   environmental modification   fall reduction program maintained   lighting adjusted for tasks/safety   low bed   nonskid shoes/slippers when out of bed  Taken 02/15/2022 2200 by Sabino Snipes, RN  Safety Interventions:   environmental modification   fall reduction program maintained   nonskid shoes/slippers when out of bed  Taken 02/15/2022 2000 by Sabino Snipes, RN  Safety Interventions:   environmental modification   fall reduction program maintained   family at bedside   lighting adjusted for tasks/safety   low bed   nonskid shoes/slippers when out of bed  Intervention: Prevent Skin Injury  Recent Flowsheet Documentation  Taken 02/16/2022 0200 by Sabino Snipes, RN  Positioning for Skin: Supine/Back  Taken 02/16/2022 0000 by Sabino Snipes, RN  Positioning for Skin: Supine/Back  Taken 02/15/2022 2200 by Sabino Snipes, RN  Positioning for Skin: Supine/Back  Taken 02/15/2022 2000 by Sabino Snipes, RN  Positioning for Skin: Supine/Back  Device Skin Pressure Protection: adhesive use limited  Skin Protection: adhesive use limited  Goal: Optimal Comfort and Wellbeing  Outcome: Progressing  Goal: Readiness for Transition of Care  Outcome: Progressing  Goal: Rounds/Family Conference  Outcome: Progressing     Problem: Latex Allergy  Goal: Absence of Allergy Symptoms  Outcome: Progressing     Problem: Pain Acute  Goal: Optimal Pain Control and Function  Outcome: Progressing     Problem: Malnutrition  Goal: Improved Nutritional Intake  Outcome: Progressing

## 2022-02-16 NOTE — Unmapped (Signed)
Gastroenterology (Luminal) Consult Service   Progress Note         Assessment & Plan:   [name] [name] is a 21 y.o. female with a PMHx of [name] s/p IPA on TPN for nutritional support who presented to [facility] with acute on chronic anemia. The patient is seen in consultation at the request of Jeannine Boga, MD (Oncology/Hematology ([facility])) for hematochezia     #Acute on chronic anemia, stable  #Hematochezia, resolved  Pouchoscopy 12/7 with circumferential ulceration at the anastomosis, treated with APC. She has had no further hematochezia and Hgb stable. Heme onc consulted and agree to stop DOAC.      #Acute on chronic abdominal pain: patient has had increased pain since procedure likely secondary to acute worsening of her chronic abdominal pain. Given her description of more liquid stools, recommend KUB to assess whether there is contribution from constipation/overflow given increase in narcotic usage. Could also consider SIBO, though less likely given cefdinir use. WBC and Cr stable, low concern at present for infection. Would stop mIVF given previous exacerbation of abdominal pain with increasing volume status. She is taking PO and received volume from TPN, which in combination is likely meeting her fluid needs. Overall goal will be gradual wean of narcotics back to her home regimen as able.  - KUB to eval stool burden, can consider starting miralax if stool burden present  - Stop mIVF  - Would attempt to wean narcotics as able back to home regimen    Recommendations discussed with the patient's primary team.    For questions, contact the on-call fellow for the Gastroenterology (Luminal) Consult Service.    Interval History:   Received 1U pRBC 12/9 for hgb 6.9. Has been stable at 8.9/8.5 today. No further episodes of hematochezia since process and stopping DOAC. Patient reports increase in abdominal pain particularly in RLQ since procedure. Also with liquid stool which is a change for her (normal stool consistency is more like applesauce). She feels this may be due to volume status as previous exacerbations in abdominal pain have happened with volume overload.    Objective:   Temp:  [36.4 ??C (97.5 ??F)-36.9 ??C (98.4 ??F)] 36.4 ??C (97.5 ??F)  Heart Rate:  [73-103] 77  Resp:  [17-19] 19  BP: (94-110)/(52-63) 101/54  SpO2:  [96 %-98 %] 97 %    Gen: WDWN female in NAD, answers questions appropriately  Abdomen: Soft, TTP in B/L lower quadrants. No rebound or guarding.    Pertinent Labs/Studies Reviewed:  Lab Results   Component Value Date    WBC 6.1 02/16/2022    HGB 8.5 (L) 02/16/2022    HCT 25.3 (L) 02/16/2022    PLT 217 02/16/2022       Lab Results   Component Value Date    NA 141 02/15/2022    K 3.7 02/15/2022    CL 107 02/15/2022    CO2 29.0 02/15/2022    BUN 12 02/15/2022    CREATININE 0.45 (L) 02/15/2022    GLU 113 02/15/2022    CALCIUM 8.7 02/15/2022    MG 1.9 02/15/2022    PHOS 4.0 02/15/2022       Lab Results   Component Value Date    BILITOT 0.3 02/12/2022    BILIDIR 0.20 12/04/2021    PROT 6.4 02/12/2022    ALBUMIN 3.4 02/12/2022    ALT 8 (L) 02/12/2022    AST 15 02/12/2022    ALKPHOS 39 (L) 02/12/2022    GGT 13 10/31/2011  Lab Results   Component Value Date    PT 11.7 02/12/2022    INR 1.05 02/12/2022    APTT 24.1 10/10/2020

## 2022-02-17 LAB — CBC
HEMATOCRIT: 27.2 % — ABNORMAL LOW (ref 34.0–44.0)
HEMOGLOBIN: 8.9 g/dL — ABNORMAL LOW (ref 11.3–14.9)
MEAN CORPUSCULAR HEMOGLOBIN CONC: 32.8 g/dL (ref 32.0–36.0)
MEAN CORPUSCULAR HEMOGLOBIN: 28.3 pg (ref 25.9–32.4)
MEAN CORPUSCULAR VOLUME: 86.2 fL (ref 77.6–95.7)
MEAN PLATELET VOLUME: 9.2 fL (ref 6.8–10.7)
PLATELET COUNT: 245 10*9/L (ref 150–450)
RED BLOOD CELL COUNT: 3.16 10*12/L — ABNORMAL LOW (ref 3.95–5.13)
RED CELL DISTRIBUTION WIDTH: 15.2 % (ref 12.2–15.2)
WBC ADJUSTED: 5.7 10*9/L (ref 3.6–11.2)

## 2022-02-17 LAB — BASIC METABOLIC PANEL
ANION GAP: 4 mmol/L — ABNORMAL LOW (ref 5–14)
BLOOD UREA NITROGEN: 10 mg/dL (ref 9–23)
BUN / CREAT RATIO: 22
CALCIUM: 8.6 mg/dL — ABNORMAL LOW (ref 8.7–10.4)
CHLORIDE: 107 mmol/L (ref 98–107)
CO2: 30 mmol/L (ref 20.0–31.0)
CREATININE: 0.46 mg/dL — ABNORMAL LOW
EGFR CKD-EPI (2021) FEMALE: >90 mL/min/{1.73_m2} (ref >=60)
GLUCOSE RANDOM: 132 mg/dL (ref 70–179)
POTASSIUM: 3.8 mmol/L (ref 3.4–4.8)
SODIUM: 141 mmol/L (ref 135–145)

## 2022-02-17 LAB — MAGNESIUM: MAGNESIUM: 2.3 mg/dL (ref 1.6–2.6)

## 2022-02-17 LAB — PHOSPHORUS: PHOSPHORUS: 4.1 mg/dL (ref 2.4–5.1)

## 2022-02-17 MED ADMIN — cefdinir (OMNICEF) capsule 300 mg: 300 mg | ORAL | @ 15:00:00 | Stop: 2022-02-27

## 2022-02-17 MED ADMIN — ammonium lactate (LAC-HYDRIN) 12 % lotion 1 Application: 1 | TOPICAL | @ 15:00:00

## 2022-02-17 MED ADMIN — HYDROmorphone (PF) (DILAUDID) injection 1 mg: 1 mg | INTRAVENOUS | @ 18:00:00 | Stop: 2022-03-01

## 2022-02-17 MED ADMIN — HYDROmorphone (PF) (DILAUDID) injection 1 mg: 1 mg | INTRAVENOUS | Stop: 2022-03-01

## 2022-02-17 MED ADMIN — mirtazapine (REMERON) tablet 7.5 mg: 7.5 mg | ORAL | @ 03:00:00

## 2022-02-17 MED ADMIN — gabapentin (NEURONTIN) capsule 200 mg: 200 mg | ORAL | @ 03:00:00

## 2022-02-17 MED ADMIN — oxyCODONE (ROXICODONE) immediate release tablet 10 mg: 10 mg | ORAL | @ 12:00:00 | Stop: 2022-02-26

## 2022-02-17 MED ADMIN — HYDROmorphone (PF) (DILAUDID) injection 1 mg: 1 mg | INTRAVENOUS | @ 11:00:00 | Stop: 2022-03-01

## 2022-02-17 MED ADMIN — OLANZapine (ZyPREXA) tablet 7.5 mg: 7.5 mg | ORAL | @ 03:00:00

## 2022-02-17 MED ADMIN — acetaminophen (TYLENOL) tablet 1,000 mg: 1000 mg | ORAL

## 2022-02-17 MED ADMIN — ondansetron (ZOFRAN) injection 4 mg: 4 mg | INTRAVENOUS | @ 15:00:00

## 2022-02-17 MED ADMIN — polyethylene glycol (MIRALAX) packet 17 g: 17 g | ORAL | @ 18:00:00

## 2022-02-17 MED ADMIN — fat emulsion 20 % with fish oil (SMOFLIPID) infusion 250 mL: 250 mL | INTRAVENOUS | @ 05:00:00 | Stop: 2022-02-17

## 2022-02-17 MED ADMIN — clobetasoL (TEMOVATE) 0.05 % ointment: TOPICAL | @ 15:00:00

## 2022-02-17 MED ADMIN — HYDROmorphone (PF) (DILAUDID) injection 1 mg: 1 mg | INTRAVENOUS | @ 03:00:00 | Stop: 2022-03-01

## 2022-02-17 MED ADMIN — oxyCODONE (ROXICODONE) immediate release tablet 10 mg: 10 mg | ORAL | @ 19:00:00 | Stop: 2022-02-26

## 2022-02-17 MED ADMIN — HYDROmorphone (PF) (DILAUDID) injection 1 mg: 1 mg | INTRAVENOUS | @ 15:00:00 | Stop: 2022-03-01

## 2022-02-17 MED ADMIN — Parenteral Nutrition (CENTRAL): INTRAVENOUS | @ 05:00:00 | Stop: 2022-02-18

## 2022-02-17 MED ADMIN — HYDROmorphone (PF) (DILAUDID) injection 1 mg: 1 mg | INTRAVENOUS | @ 21:00:00 | Stop: 2022-03-01

## 2022-02-17 MED ADMIN — oxyCODONE (ROXICODONE) immediate release tablet 10 mg: 10 mg | ORAL | @ 04:00:00 | Stop: 2022-02-26

## 2022-02-17 MED ADMIN — acetaminophen (TYLENOL) tablet 1,000 mg: 1000 mg | ORAL | @ 18:00:00

## 2022-02-17 MED ADMIN — pantoprazole (Protonix) injection 40 mg: 40 mg | INTRAVENOUS | @ 15:00:00 | Stop: 2022-02-17

## 2022-02-17 MED ADMIN — ondansetron (ZOFRAN) injection 4 mg: 4 mg | INTRAVENOUS

## 2022-02-17 MED ADMIN — acetaminophen (TYLENOL) tablet 1,000 mg: 1000 mg | ORAL | @ 11:00:00

## 2022-02-17 NOTE — Unmapped (Signed)
Oncology (MEDO) Progress Note    Assessment & Plan:   [name] [name] is a 21 y.o. female whose presentation is complicated by [name] [name] Syndrome (familial adenomatosis polyposis) s/p proctocolectomy w/ ileoanal anastomosis, desmoid tumors (on sorafenib), anemia, severe protein-calorie malnutrition on TPN, and anxiety/nausea who presented to [facility] with concern for GI bleed.       Principal Problem:    GI bleeding  Active Problems:    [name]    [name]    Pain    Iron deficiency anemia due to chronic blood loss    History of colectomy  Resolved Problems:    * No resolved hospital problems. *      Active Problems    Concern for GI Bleeding - Acute on Chronic anemia - Iron deficiency Anemia  Patient coming in after notable hemoglobin drop on outpatient labs to 7.6 from baseline of 11 with associated bright red blood per rectum over the last 5 days. Had recent hospitalization for similar presentation, bleeding at the time attributed to anal fissures. Underwent endoscopy 12/7 with GI which showed bleeding ulcer at ileoanal anastamosis. This may have been exacerbated because patient was on eliquis. On 12/8 AM, hgb was 6.9 requiring 1 unit of PRBC. Responded appropriately with repeat hgb of 9.2. Per patient, she has had significant menstrual bleeding which is also a source of the blood loss.  - Maintain active type and screen  - Discontinue eliquis per hematology recs  - Daily H&H  - Continue IV PPI BID    Acute on chronic abdominal pain 2/2 desmoid tumors  Patient's baseline pain is LLQ and now presenting with RLQ pulling, tugging pain. CT abdomen pelvis with IV contrast done in the ED does show unchanged size and distribution of multiple mesenteric masses compatible with desmoid tumors. However there is also new R adnexal cyst noted. Patient is diffusely tender to palpation but specifies that RLQ is where pain is worse/different. Patient also reports that she is currently menstruating which may be contributing to the pain. Continues to have similar to slightly worsening abdominal pain. KUB demonstrates partial SBO vs ileus baded on gas pattern however patient continues to have bowel movements. Repeat CT A/P showed distal small bowel dilation and proximal collapse with no transition zone, ruling out acute obstructive process. Distal dilation may be due to constipation. Last BM was 12/10 PM.   -- Miralax daily for constipation, consider PAMORA if not improved   -- considering fentanyl patch for pain  -- Continue tylenol 650 mg Q6h prn   - Continue oxycodone 10 mg every 4 hours PRN  - IV Dilaudid 1 mg every 3 hours as needed for breakthrough pain    Headache, sinus osteomas  Patient reports retro-orbital headache for 3 days that started after blood transfusion. On 12/10, reported some blurry vision but denies any other neurological symptoms and no focal deficits on exam. CT head 12/10 showed osteomas in R sphenoid and bilateral ethmoid sinuses that may be contributing to pain.   - ENT consulted   - CT sinus wo contrast     Severe Malnutrition 2/2 [name] and abdominal desmoid tumors   On previous admission she was discharged with a Corpak and was ultimately transitioned to TPN.  She is currently receiving TPN every day and this is being followed by Dr. [name], outpatient GI. TPN resumed 12/7.  - Nutrition consulted for TPN recommendations     [name]/Desmoid Tumors   Follows with Dr. [name].  - Hold  sorafenib daily as it can cause bleeding, fistulas  -- Will defer restarting to outpatient oncologist    Hx of PICC Line associated clot   She was treated for this back in January and given recent reinsertion of PICC line for TPN was started on a prophylactic dose of Eliquis.  - Hematology consulted, recommended discontinuing eliquis and continuing with symptom directed management      Chronic Problems  Anxiety   - Continue Zyprexa 7.5 mg nightly  - Continue Remeron 7.5 nightly  - Continue gabapentin 200 mg nightly     Hx of Pouchitis   Patient has been on cefdinir for this and was recently decreased the dose by her GI doctor for prophylactic dosing. On last scope in November, noted to not have active pouchitis.  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: recurring with re-initiation of sorafenib  -Continue clobetasol 0.05%    Issues Impacting Complexity of Management:      Medical Decision Making: Discussed the patient's management and/or test interpretation with Gastroenterology as summarized within this note      Daily Checklist:  Diet: Regular Diet  DVT PPx: Contraindicated - High Risk for Bleeding/Active Bleeding  Electrolytes: Replete Potassium to >/= 3.6 and Magnesium to >/= 1.8  Code Status: Full Code  Dispo: Home    Team Contact Information:   Primary Team: Oncology (MEDO)  Primary Resident: Nyoka Lint, MD  Resident's Pager: [phone] (Oncology Intern - Tower)    Interval History:   No acute overnight events. Patient reports continued headache and severe lower abdominal pain. Repeat CT showed distal dilation that may be indicative of opioid related constipation. Patient would like to try miralax today, but has been having consistent bowel movements overall.    All other systems were reviewed and are negative except as noted in the HPI    Objective:   Temp:  [36.3 ??C (97.3 ??F)-37 ??C (98.6 ??F)] 36.6 ??C (97.9 ??F)  Heart Rate:  [77-97] 91  Resp:  [16-18] 16  BP: (94-113)/(51-65) 101/57  SpO2:  [96 %-99 %] 97 %    Gen: NAD, converses   HENT: atraumatic, normocephalic  Heart: RRR  Lungs: CTAB, no crackles or wheezes  Abdomen: diffuse abdominal tenderness with significant pain on palpation in bilateral lower quadrants R>L  Extremities: No edema

## 2022-02-17 NOTE — Unmapped (Unsigned)
[facility] BONE AND SOFT TISSUE ONCOLOGY RETURN VISIT    Encounter Date: 02/18/2022  Patient Name: [name] [name]  Medical Record Number: [id number]    Referring Physician: Oneita Hurt, None Per Patient  [address] [place],  [place] [address]    Primary Care Provider: Jarold Motto, [facility]    DIAGNOSIS:  No diagnosis found.      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    Desmoid fibromatosis, history of [name]     She has had numerous sites of disease, including paraspinal, chest wall, right arm/wrist. She has had several cryoablations with Dr. Cathie Hoops which have seemed to offer clinical benefit. She trialed sorafenib which seemed to lead to shrinkage of lesions, but she had significant intolerance with dizziness, presyncope, fatigue, hair loss, rash, GI side effects.      We have discussed at length the nature of desmoid fibromatosis tumors, the fact they can wax and wane independent of therapy, are not cancers in a metastatic potential, but can be locally infiltrative and cause substantial morbidity to the site of disease and occasionally mortality. I reviewed that the emerging standard of care is observation based on the fact that 20-30% will experience spontaneous regression in the year following diagnosis. Given her long experience with desmoids, complete regression may be less likely, but we will still approach this cautiously with parallel goals of avoidance of overtreatment (particularly surgical) and palliation of symptoms / preservation of function.     Current lesions are in the shoulder, right forearm, chest area, with a possible desmoid in her left thigh. Symptoms are manageable at the moment, after recent cryoablations and steroid injection to the forearm.     - Her poor tolerance of sorafenib was likely multifactorial: simultaneous COVID infection and difficulty with good supportive care given that she was in a busy college semester several hours away from her medical team. Would not rule out trying this again in the future with optimized supportive care  - Alternatively, pazopanib would be a reasonable option, per DESMOPAZ (82% nonprogression at 6 mos, similar RR to sorafenib)  - clinical trials, novel or off-label therapies may be reasonable if available: potential agents include DFMO, gamma secretase inhibitors, NOTCH pathway inhibitors    11/09/20: MRI without evidence of recurrent desmoids, stability of prior lesions.  02/14/21: Biopsy of thigh muscle thickening - benign, hypocellular fibrous tissue. Favor Gardner fibroma - on the desmoid spectrum but less cellular and less aggressive   02/14/21: Percutaneous steroid injection of forearm lesions.    03/19/21: Lots of active issues, although desmoids are not one of them. On TPN for diarrhea, vomiting, intolerance of food, significant malnutrition. Having to withdraw partially from school given medical issues.     06/21/21: Has had a complex few months, on TPN, significant fluid overload, caused worsening desmoid pain. Now off TPN, nutrition slowly improving, tumor pain better. Scans show 2 ill defined soft tissue nodules, concerning for recurrent desmoid. Start with surveillance, given risks of systemic therapy after recent significant GI and medical issues.     09/03/21: Scans show stable disease. Was admitted for abdominal pain, concern for intussusception, N/V, but we discussed that these may not be related to her desmoids. Opted for continued surveillance.    12/03/21: Scans show slow growth when reflecting over the past several months. Importantly, she reports substantially worsening abdominal pain which is centered on her desmoids, poor appetite, weight loss, possible transient obstructive symptoms. Long conversation  about options and pros and cons of each. She would like to trial sorafenib again, acknowledging the risks. She will meet with GI, her psychiatry team (she has had challenges with duloxetine), pain team, and may reconnect with Dr. Selena Batten in the interim to discuss other supportive care measures and procedural options.     9/28-10/13/23: Hospitalization for abdominal pain, malnutrition. Pouchoscopy 10/4 without stricture. Initiated on tube feeds. Sorafenib was initiated ~ 10/3 and tolerated reasonably well in house.    12/31/21: Tox check. Sorafenib 200mg  daily, but a variety of ongoing issues. Diarrhea (which may be drug related), ongoing abdominal pain (perhaps partially desmoid related but not exclusively), intolerance of PO diet and even tube feeds (driven by pain / cramping), rash (drug related), anxiety. Will address several factors, detailed below in the plan.    01/10/22-01/22/22: Since last visit, had 2-week hospitalization, due to uncontrolled plain in the setting of recent celiac plexus block, complicated by GI bleeding in setting of anticoagulation due to line associated DVT. She subsequently required multiple iron infusions, and was discharged on eliquis 2.5mg  BID.  During the hospitalization was noted to be persistently tachycardic, likely multifactorial etiology.  Additionally during her hospital admission was restarted on TPN in consultation with her GI team.  Patient was discharged on 01/22/2022 on TPN.      02/18/22: ***    Anxiety  AYA Needs  Patient is 21 and in the past year has had the collision of numerous medical challenges which have pushed her to see her health and future medical care more clearly. Trying to balance all of her medical issues this with future goals of career, nursing, college. Significant burden of anxiety, maybe depression as well. Was reportedly diagnosed with medical trauma by the counseling office at her university. Also trying to establish her own autonomy after a medicalized childhood  - connected with AYA team, [name]. May need input from [facility] or others at some point  - seen by AYA palliative care sarcoma collaborative, [name], will assist with pain management and symptom control, as well as grappling with current issues relating to cancer, AYA, development    Tachycardia  Patient with persistent sinus tachycardia, usually around 100 bpm.  This is in the setting of anxiety and abdominal pain.  Etiology likely multifactorial as she is also unable to take good p.o., currently on TPN.  Discussed potential interactions of sorafenib, do not feel that this is significantly contributing to her tachycardia.  Plan to continue work with subspecialists to control her anxiety as well as taking steps with the pain management team to have her pain better managed.  Will give 1 L IV fluids today and continue to encourage appropriate p.o. intake.     Tumor pain: seen by AYA sarcoma palliative care, [name], as well as pain team - [name] has been working closely with her  -defer to pain team     Contraception: Not reviewed today, will need to confirm prior to initiation of TKI  -placed fertility referral today due to her interest in discussing options with them, especially prior to possible use of nirogacestat which has a risk of amenorrhea / ovarian dysfunction    Line Associated DVT:   Duplex demonstrates DVT shortly after line placement. Treated with Apixaban 5mg  BID x 3 mos, now completed    Plan:  -continue hycosamine which was helpful for cramping / diarrhea  -continue pain management with pain service  -continue sorafenib at 200mg  daily (  goal is eventually 400mg  daily)  -RTC 1 month for tox check    I personally reviewed the medical records, pathology and laboratory results and viewed the imaging. All questions were answered to the patient's apparent satisfaction and they voiced understanding and agreement with the plan. Pt has my card and contact information and is encouraged to reach out with any further questions.    Donzetta Sprung, MD/MPH  Assistant Professor  Bone and Soft Tissue Oncology Program  [facility]  Pager: [phone]  Nurse Navigator: Roseanne Reno RN, BSN, OCN      REASON FOR CONSULTATION:   [name] [name] is a 21 y.o. female who is seen in consultation at the request of Pcp, None Per Patient for evaluation of her desmoid fibromatosis    HISTORY OF PRESENT ILLNESS:      Attends school at [employer] in [place], [place]      Right arm desmoid is the most bothersome - significant arm cramping, deep aching pain, lasts for ~30 min after cramping starts. Has noticed right hand shaking frequently. 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     Left chest lesion used to be quite uncomfortable, caused swelling in her arm and discomfort. Improved after cryoablation.     Right shoulder blade hurts when she leans back against it, fairly painful. Right chest wall lesion has some associated pain, improving since cryo.     First diagnosed at 21 years old - seen in [place] at first, had numerous cysts. First major issues were two paraspinal lesions, referred to [facility] for biopsy and ultimately resection.  Soon diagnosed with [name], seen by medical genetics early on.  Guided by Dr. Debbe Mounts, referred out to proceduralists as needed.  2011 - had a growing lesion on her back as well as bilateral neck lesions, resected at [facility], found to be desmoid  02/2012 - surgery for thoracic spine desmoid. Maybe another flank lesion removed  2017 - wrist lesion treated by [facility] with bupivicaine, kenalog  08/2016 - endoscopy with adenomatous polyp of the colon, non-dysplastic  02/2017 - subcutaneous lesion resection by plastic surgery (postauricular and RUE, RLE) - path revealed epidermal inclusion cysts, osteomas  05/2018: cryotherapy of left anterior chest wall lesion  05/2018: started sulindac but struggled with adherence due to GI side effects  08/2018: cryo #2 to chest wall lesion  12/10/2018: started sorafenib due to progression in left paraspinal lesion, ?posterior chest wall lesion  02/2019: discontinued sorafenib for several reasons: fatigue, dizziness, nausea, hair loss, as well as COVID infection in late November. Derm consulted for rash / hair loss, started on antihistamines, topical steroids. Trialed lower dose but ultimately stopped again 05/2019 due to toxicity.  06/2019: follow up with surgical oncology - plans for observation rather than repeat surgery  07/2019: cryoablation of anterior chest wall desmoid  07/2019: excision of subcutaneous LE mass and L neck masses, clinically appeared consistent with cysts  08/2019: cryoablation of R posterior chest wall, left paraspinal desmoid     Interval History:  Since last visit, had 2-week hospitalization, due to uncontrolled plain in the setting of recent celiac plexus block, complicated by GI bleed, subsequently requiring multiple iron infusions.  During the hospitalization was noted to be persistently tachycardic, likely multifactorial etiology.  Additionally during her hospital admission was restarted on TPN.  Patient was discharged on 01/22/2022 on TPN.  She is still taking some oral intake.  Since then overall has just not felt well, continues  to be persistently tachycardic, running generally in the low 100s.  However she notes that the lowest she was gone while asleep was 74 bpm and has had tachycardia as high as 160s to 180s even while at rest.  Due to worsening anxiety spoke to her psychiatrist a couple of days ago who plan to increase her olanzapine to 7.5 mg nightly which she will start today.  Additionally she has seen dermatology which has helped with the difficulty she was having with her skin, however she still states that she is losing some hair.  She continues to follow with [name] her pain management specialist however continues to have significant pain.  Her pain is mostly abdominal and was in her left lower quadrant.  She has some pain in her shoulder for which she receives trigger point injections and will see you have an injection tomorrow with [name].  She denies any numbness tingling in the hands and feet.  States that she often has loose stools secondary to her J-pouch however no worsening diarrhea than usual.  She endorses persistent fatigue at baseline.  Endorses some mucositis, she received Magic mouthwash in the hospital which helped but did not receive any on discharge.    REVIEW OF SYSTEMS:  A comprehensive review of 12 systems was negative except for pertinent positives noted in HPI.    Past Medical History, Surgical History, Family History were reviewed personally. Any changes were updated as above.    Social History:  Attended [employer] in [place], has been interrupted by health issues  Wants to be a Engineer, civil (consulting) in pediatric hematology/oncology   Mother Natalia Leatherwood is a strong advocate, often present at visits    Pregnancy status: premenopausal, interested in future children    ALLERGIES/MEDICATIONS:  Reviewed in Epic    PHYSICAL EXAM:   LMP 01/15/2022 (Exact Date)   ECOG 1  Gen - well appearing, in no acute distress, mother accompanying her   Eyes - conjunctivae clear, PERRL  ENT -no scleral icterus, moist mucous membranes  Lymph - no cervical or supraclavicular lymphadenopathy appreciated  Resp - clear to auscultation bilaterally, no wheezes, crackles or rales  CV - normal rate, regular rhythm, no murmur appreciated, no lower extremity edema noted  GI - abdomen slightly firm, diffusely tender, prior surgical scars appear well-healed, no masses, bowel sounds normal  Skin - no rashes, no lesions noted  Neuro - AOx3, EOM intact, no facial droop, speech fluent and coherent, moves all extremities without asymmetry  Psych - affect normal, mood okay  MSK - no joint tenderness or swelling    PATHOLOGY:   Pathology was personally reviewed as described in the HPI, detailed in Epic.     LABS:  Reviewed in Epic    RADIOLOGY:  Imaging was personally viewed and interpreted as summarized in the history (see above).

## 2022-02-17 NOTE — Unmapped (Signed)
Otolaryngology Consult Note    Requesting Attending Physician:  Jeannine Boga, MD  Service Requesting Consult:  Oncology/Hematology (MDE)    Assessment/Recommendations:  This is a 21 y.o. 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 Given equal bilateral symptoms, light sensitivity, sudden onset after blood transfusion on 12/8 (with history of similar symptoms with prior iron infusions), some improvement over the past day, her symptoms are likely transfusion related rather than to the osteomas.    1.  Recommend obtaining CT sinus for further evaluation (ordered)  2.  She has had some increased nasal congestion over the past 2 days, with evidence of some inspissated nasal secretions.  Recommend BID nasal saline sprays, consider Flonase twice daily.    The patient will be staffed with Dr. Elizebeth Koller.  Thank you for inviting [name] to assist in the care of your patient.  Please page the Otolaryngology consult pager at [phone] with questions/concerns.    History of Present Illness:     Problem List    Principal Problem:    GI bleeding  Active Problems:    [name]    [name]    Pain    Iron deficiency anemia due to chronic blood loss    History of colectomy    [name] [name] is seen in consultation at the request of Jeannine Boga, MD for retroorbital pain.     21 y.o. 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  She reports she had a blood transfusion on 12/8, subsequently after which she started developing bilateral retro-orbital pain that was equal bilaterally, as well as light sensitivity.  She reports pain progressively got worse until last night, after which it has started to improve.  She has been able to tolerate light today.  She mentions that she had a little bit of blurry vision immediately after the blood transfusion, which has since resolved. She does endorse having similar symptoms in the past with iron transfusions, however, no to this extent.     Otherwise no vision changes, no double vision.  She reports she has been feeling a little congested over the past couple weeks, but in general does not have any sinonasal issues.    In evaluation of her headaches, primary team obtained a CT head which showed osteomas in the paranasal sinuses, largest abutting her lamina and the right ethmoids as well as small osteomas in the right sphenoid and left ethmoids.  Otherwise, in review of the chart her last head imaging was MRI brain and 2018 which had some right maxillary sinus mucosal thickening, otherwise no obvious sinonasal osteomas or evidence of intracranial pathology.    She does have a history of bilateral post-auricular osteomas, which were excised by Dr. Lucretia Roers in 2018 without any recurrence.      Past Medical History    Past Medical History:   Diagnosis Date    Abdominal pain     Acid reflux     occas    Anesthesia complication     itching, shaking, coldness; last few surgeries have gone much better    Cancer (CMS-HCC)     Cataract of right eye     COVID-19 virus infection 01/2019    Cyst of thyroid determined by ultrasound     monitoring    [name]  2 right forearm, 1 left thigh, 1 right scapula, 1 under left clavicle; multiple    Difficult intravenous access     [name] (familial adenomatous polyposis)     [name]     Gastric polyps     History of chemotherapy     last treatment approx 05/2019    History of colon polyps     History of COVID-19 01/2019    Iron deficiency anemia due to chronic blood loss     received iron infusion 11-2019    PONV (postoperative nausea and vomiting)     Rectal bleeding     Syncopal episodes     especially if becoming dehydrated       Allergies    Adhesive tape-silicones; Ferrlecit [sodium ferric gluconat-sucrose]; Levofloxacin; Methylnaltrexone; Neomycin; Papaya; Morphine; Zosyn [piperacillin-tazobactam]; Compazine [prochlorperazine]; and Latex, natural rubber    Medications      Current Facility-Administered Medications   Medication Dose Route Frequency Provider Last Rate Last Admin    acetaminophen (TYLENOL) tablet 1,000 mg  1,000 mg Oral TID [name], MD   1,000 mg at 02/17/22 1232    ammonium lactate (LAC-HYDRIN) 12 % lotion 1 Application  1 Application Topical BID Mack Guise, MD   1 Application at 02/17/22 0939    baclofen (LIORESAL) tablet 5-10 mg  5-10 mg Oral BID PRN Madishetty, Micki Riley, MD        cefdinir (OMNICEF) capsule 300 mg  300 mg Oral Daily Madishetty, Micki Riley, MD   300 mg at 02/17/22 0931    clobetasoL (TEMOVATE) 0.05 % ointment   Topical BID Madishetty, Micki Riley, MD   Given at 02/17/22 1610    emollient combination no.92 (LUBRIDERM) lotion   Topical Continuous PRN Madishetty, Micki Riley, MD        Melene Muller ON 02/18/2022] Parenteral Nutrition (CENTRAL)   Intravenous Continuous Jeannine Boga, MD        And    Melene Muller ON 02/18/2022] fat emulsion 20 % with fish oil (SMOFLIPID) infusion 250 mL  250 mL Intravenous Continuous Jeannine Boga, MD        gabapentin (NEURONTIN) capsule 200 mg  200 mg Oral Nightly Madishetty, Sanjana, MD   200 mg at 02/16/22 2156    HYDROmorphone (PF) (DILAUDID) injection 0.5 mg  0.5 mg Intravenous Q2H PRN Mack Guise, MD        Or    HYDROmorphone (PF) (DILAUDID) injection 1 mg  1 mg Intravenous Q2H PRN Mack Guise, MD   1 mg at 02/17/22 1232    mirtazapine (REMERON) tablet 7.5 mg  7.5 mg Oral Nightly Madishetty, Sanjana, MD   7.5 mg at 02/16/22 2156    OLANZapine (ZyPREXA) tablet 7.5 mg  7.5 mg Oral Nightly Madishetty, Sanjana, MD   7.5 mg at 02/16/22 2156    ondansetron (ZOFRAN-ODT) disintegrating tablet 4 mg  4 mg Oral Q8H PRN Madishetty, Micki Riley, MD   4 mg at 02/13/22 9604    Or    ondansetron (ZOFRAN) injection 4 mg  4 mg Intravenous Q8H PRN Madishetty, Sanjana, MD   4 mg at 02/17/22 0932    oxyCODONE (ROXICODONE) immediate release tablet 10 mg  10 mg Oral Q4H PRN Madishetty, Micki Riley, MD   10 mg at 02/17/22 0707    pantoprazole (Protonix) injection 40 mg  40 mg Intravenous Daily Madishetty, Micki Riley, MD   40 mg at 02/17/22 0932    Parenteral Nutrition (CENTRAL)   Intravenous Continuous [name], MD 100 mL/hr at 02/17/22 0026 New Bag at 02/17/22  0026    polyethylene glycol (MIRALAX) packet 17 g  17 g Oral Daily [name], MD   17 g at 02/17/22 1232       Past Surgical History    Past Surgical History:   Procedure Laterality Date    cyroablation      cystis removal      desmoid removal      PR CLOSE ENTEROSTOMY,RESEC+ANAST N/A 10/09/2020    Procedure: ILEOSTOMY TAKEDOWN;  Surgeon: Mickle Asper, MD;  Location: OR [place];  Service: General Surgery    PR COLONOSCOPY W/BIOPSY SINGLE/MULTIPLE N/A 10/27/2012    Procedure: COLONOSCOPY, FLEXIBLE, PROXIMAL TO SPLENIC FLEXURE; WITH BIOPSY, SINGLE OR MULTIPLE;  Surgeon: Shirlyn Goltz Mir, MD;  Location: PEDS PROCEDURE ROOM [facility];  Service: Gastroenterology    PR COLONOSCOPY W/BIOPSY SINGLE/MULTIPLE N/A 09/14/2013    Procedure: COLONOSCOPY, FLEXIBLE, PROXIMAL TO SPLENIC FLEXURE; WITH BIOPSY, SINGLE OR MULTIPLE;  Surgeon: Shirlyn Goltz Mir, MD;  Location: PEDS PROCEDURE ROOM [facility];  Service: Gastroenterology    PR COLONOSCOPY W/BIOPSY SINGLE/MULTIPLE N/A 11/08/2014    Procedure: COLONOSCOPY, FLEXIBLE, PROXIMAL TO SPLENIC FLEXURE; WITH BIOPSY, SINGLE OR MULTIPLE;  Surgeon: Arnold Long Mir, MD;  Location: PEDS PROCEDURE ROOM [facility];  Service: Gastroenterology    PR COLONOSCOPY W/BIOPSY SINGLE/MULTIPLE N/A 12/26/2015    Procedure: COLONOSCOPY, FLEXIBLE, PROXIMAL TO SPLENIC FLEXURE; WITH BIOPSY, SINGLE OR MULTIPLE;  Surgeon: Arnold Long Mir, MD;  Location: PEDS PROCEDURE ROOM [facility];  Service: Gastroenterology    PR COLONOSCOPY W/BIOPSY SINGLE/MULTIPLE N/A 09/02/2017    Procedure: COLONOSCOPY, FLEXIBLE, PROXIMAL TO SPLENIC FLEXURE; WITH BIOPSY, SINGLE OR MULTIPLE;  Surgeon: Arnold Long Mir, MD;  Location: PEDS PROCEDURE ROOM [place];  Service: Gastroenterology    PR COLSC FLX W/REMOVAL LESION BY HOT BX FORCEPS N/A 08/27/2016    Procedure: COLONOSCOPY, FLEXIBLE, PROXIMAL TO SPLENIC FLEXURE; W/REMOVAL TUMOR/POLYP/OTHER LESION, HOT BX FORCEP/CAUTE;  Surgeon: Arnold Long Mir, MD;  Location: PEDS PROCEDURE ROOM [facility];  Service: Gastroenterology    PR COLSC FLX W/RMVL OF TUMOR POLYP LESION SNARE TQ N/A 02/25/2019    Procedure: COLONOSCOPY FLEX; W/REMOV TUMOR/LES BY SNARE;  Surgeon: Helyn Numbers, MD;  Location: [facility] PROCEDURES [place] [facility];  Service: Gastroenterology    PR COLSC FLX W/RMVL OF TUMOR POLYP LESION SNARE TQ N/A 03/13/2020    Procedure: COLONOSCOPY FLEX; W/REMOV TUMOR/LES BY SNARE;  Surgeon: Helyn Numbers, MD;  Location: [facility];  Service: Gastroenterology    PR EXC SKIN BENIG 2.1-3 CM TRUNK,ARM,LEG Right 02/25/2017    Procedure: EXCISION, BENIGN LESION INCLUDE MARGINS, EXCEPT SKIN TAG, LEGS; EXCISED DIAMETER 2.1 TO 3.0 CM;  Surgeon: Clarene Duke, MD;  Location: CHILDRENS OR [facility];  Service: Plastics    PR EXC SKIN BENIG 3.1-4 CM TRUNK,ARM,LEG Right 02/25/2017    Procedure: EXCISION, BENIGN LESION INCLUDE MARGINS, EXCEPT SKIN TAG, ARMS; EXCISED DIAMETER 3.1 TO 4.0 CM;  Surgeon: Clarene Duke, MD;  Location: CHILDRENS OR [facility];  Service: Plastics    PR EXC SKIN BENIG >4 CM FACE,FACIAL Right 02/25/2017    Procedure: EXCISION, OTHER BENIGN LES INCLUD MARGIN, FACE/EARS/EYELIDS/NOSE/LIPS/MUCOUS MEMBRANE; EXCISED DIAM >4.0 CM;  Surgeon: Clarene Duke, MD;  Location: CHILDRENS OR [facility];  Service: Plastics    PR EXC TUMOR SOFT TISSUE LEG/ANKLE SUBQ 3+CM Right 08/05/2019    Procedure: EXCISION, TUMOR, SOFT TISSUE OF LEG OR ANKLE AREA, SUBCUTANEOUS; 3 CM OR GREATER;  Surgeon: Arsenio Katz, MD;  Location: MAIN OR [place];  Service: Plastics    PR EXC TUMOR SOFT TISSUE LEG/ANKLE SUBQ <  3CM Right 08/05/2019 Procedure: EXCISION, TUMOR, SOFT TISSUE OF LEG OR ANKLE AREA, SUBCUTANEOUS; LESS THAN 3 CM;  Surgeon: Arsenio Katz, MD;  Location: MAIN OR [facility];  Service: Plastics    PR LAP, SURG PROCTECTOMY W J-POUCH N/A 08/10/2020    Procedure: ROBOTIC ASSISTED LAPAROSCOPIC PROCTOCOLECTOMY, ILEAL J POUCH, WITH OSTOMY;  Surgeon: Mickle Asper, MD;  Location: OR [place];  Service: General Surgery    PR NDSC EVAL INTSTINAL POUCH DX W/COLLJ SPEC SPX N/A 01/23/2021    Procedure: ENDO EVAL SM INTEST POUCH; DX;  Surgeon: Modena Nunnery, MD;  Location: [facility] [facility];  Service: Gastroenterology    PR NDSC EVAL INTSTINAL POUCH DX W/COLLJ SPEC SPX N/A 08/27/2021    Procedure: ENDO EVAL SM INTEST POUCH; DX;  Surgeon: Hunt Oris, MD;  Location: [facility] PROC[facility]URES [facility];  Service: Gastroenterology    PR NDSC EVAL INTSTINAL POUCH DX W/COLLJ SPEC SPX N/A 12/09/2021    Procedure: ENDO EVAL SM INTEST POUCH; DX;  Surgeon: Vidal Schwalbe, MD;  Location: GI PROCEDURES [facility];  Service: Gastroenterology    PR NDSC EVAL INTSTINAL POUCH W/BX SINGLE/MULTIPLE N/A 01/20/2022    Procedure: ENDOSCOPIC EVAL OF SMALL INTESTINAL POUCH; DIAGNOSTIC, No biopsies;  Surgeon: Andrey Farmer, MD;  Location: GI PROCEDURES [facility];  Service: Gastroenterology    PR NDSC EVAL INTSTINAL POUCH W/BX SINGLE/MULTIPLE N/A 02/13/2022    Procedure: ENDOSCOPIC EVAL OF SMALL INTESTINAL POUCH; DIAGNOSTIC, WITH BIOPSY;  Surgeon: Bronson Curb, MD;  [facility]: [facility]URES [facility];  Service: Gastroenterology    PR UNLISTED PROCEDURE SMALL INTESTINE  01/23/2021    Procedure: UNLISTED PROCEDURE, SMALL INTESTINE;  Surgeon: Modena Nunnery, MD;  Location: [facility] [place] [facility];  Service: Gastroenterology    PR UNLISTED PROCEDURE SMALL INTESTINE  02/13/2022    Procedure: UNLISTED PROCEDURE, SMALL INTESTINE;  Surgeon: Bronson Curb, MD;  [facility]: [facility]URES [facility];  Service: Gastroenterology    PR UPPER GI ENDOSCOPY,BIOPSY N/A 10/27/2012    Procedure: UGI ENDOSCOPY; WITH BIOPSY, SINGLE OR MULTIPLE;  Surgeon: Shirlyn Goltz Mir, MD;  Location: PEDS PROCEDURE ROOM [facility];  Service: Gastroenterology    PR UPPER GI ENDOSCOPY,BIOPSY N/A 09/14/2013    Procedure: UGI ENDOSCOPY; WITH BIOPSY, SINGLE OR MULTIPLE;  Surgeon: Shirlyn Goltz Mir, MD;  Location: PEDS PROCEDURE ROOM [facility];  Service: Gastroenterology    PR UPPER GI ENDOSCOPY,BIOPSY N/A 11/08/2014    Procedure: UGI ENDOSCOPY; WITH BIOPSY, SINGLE OR MULTIPLE;  Surgeon: Arnold Long Mir, MD;  Location: PEDS PROCEDURE ROOM [facility];  Service: Gastroenterology    PR UPPER GI ENDOSCOPY,BIOPSY N/A 12/26/2015    Procedure: UGI ENDOSCOPY; WITH BIOPSY, SINGLE OR MULTIPLE;  Surgeon: Arnold Long Mir, MD;  Location: PEDS PROCEDURE ROOM [facility];  Service: Gastroenterology    PR UPPER GI ENDOSCOPY,BIOPSY N/A 08/27/2016    Procedure: UGI ENDOSCOPY; WITH BIOPSY, SINGLE OR MULTIPLE;  Surgeon: Arnold Long Mir, MD;  Location: PEDS PROCEDURE ROOM [facility];  Service: Gastroenterology    PR UPPER GI ENDOSCOPY,BIOPSY N/A 09/02/2017    Procedure: UGI ENDOSCOPY; WITH BIOPSY, SINGLE OR MULTIPLE;  Surgeon: Arnold Long Mir, MD;  Location: PEDS PROCEDURE ROOM [facility];  Service: Gastroenterology    PR UPPER GI ENDOSCOPY,BIOPSY N/A 03/13/2020    Procedure: UGI ENDOSCOPY; WITH BIOPSY, SINGLE OR MULTIPLE;  Surgeon: Helyn Numbers, MD;  Location: [facility] [place] [facility];  Service: Gastroenterology    PR UPPER GI ENDOSCOPY,BIOPSY N/A 09/05/2021    Procedure: UGI ENDOSCOPY; WITH BIOPSY, SINGLE OR MULTIPLE;  Surgeon:  Wendall Papa, MD;  [facility]: [facility];  Service: Gastroenterology    PR UPPER GI ENDOSCOPY,DIAGNOSIS N/A 01/20/2022    Procedure: UGI ENDO, INCLUDE ESOPHAGUS, STOMACH, & DUODENUM &/OR JEJUNUM; DX W/WO COLLECTION SPECIMN, BY BRUSH OR WASH;  Surgeon: Andrey Farmer, MD;  Location: GI PROCEDURES [facility];  Service: Gastroenterology    TUMOR REMOVAL multiple-head, neck, back, hand, right flank, multiple       Family History    Family History   Problem Relation Age of Onset    No Known Problems Mother     No Known Problems Father     No Known Problems Sister     No Known Problems Brother     Stroke Maternal Grandmother     Other Maternal Grandmother         benign lesions of liver and pancreas, further details unknown    Cancer Maternal Grandmother     Diabetes Maternal Grandmother     Hypertension Maternal Grandmother     Thyroid disease Maternal Grandmother     Arthritis Maternal Grandfather     Asthma Maternal Grandfather     COPD Paternal Grandmother         Deceased    Miscarriages / Stillbirths Paternal Grandmother     Alcohol abuse Paternal Grandfather         Deceased    No Known Problems Maternal Aunt     No Known Problems Maternal Uncle     No Known Problems Paternal Aunt     No Known Problems Paternal Uncle     Anesthesia problems Neg Hx     Broken bones Neg Hx     Cancer Neg Hx     Clotting disorder Neg Hx     Collagen disease Neg Hx     Diabetes Neg Hx     Dislocations Neg Hx     Fibromyalgia Neg Hx     Gout Neg Hx     Hemophilia Neg Hx     Osteoporosis Neg Hx     Rheumatologic disease Neg Hx     Scoliosis Neg Hx     Severe sprains Neg Hx     Sickle cell anemia Neg Hx     Spinal Compression Fracture Neg Hx     Melanoma Neg Hx     Basal cell carcinoma Neg Hx     Squamous cell carcinoma Neg Hx        Social History:    Tobacco use: reports that she has never smoked. She has been exposed to tobacco smoke. She has never used smokeless tobacco.  Alcohol use:  reports no history of alcohol use.  Drug use: reports no history of drug use.    Review of Systems  Review of Systems:  As above and, otherwise the balance of 11 systems was negative.    Objective:     Vital Signs  Temp:  [36.3 ??C (97.3 ??F)-37 ??C (98.6 ??F)] 36.8 ??C (98.2 ??F)  Heart Rate:  [77-97] 77  Resp:  [16-18] 16  BP: (94-113)/(51-65) 101/51  MAP (mmHg):  [63-80] 65  SpO2:  [96 %-99 %] 98 %  No intake/output data recorded.      Physical Exam    General: well appearing, stated age, no distress   Communicates age appropriate, responds to speech well  Head - atraumatic, normocephalic   Face - no surface abnormalities, no tenderness over sinuses. Facial nerve function symmetric and fully intact, HB 1/6 bilaterally.  Sensation intact throughout all distributions of CN5.  Eyes - no conjunctivitis, no scleritis/iritis, EOM full   Ears - External ear- normal, no lesions, no malformations   Otoscopy - Bilateral EACs and TMs normal  Nose - external nose without deformity, anterior rhinoscopy - turbinates, mucosa normal   Oral Cavity - moist mucous membranes, no lesions of the labial, buccal, gingival, palatal, or lingual mucosa. Fair dentition.   Oropharynx - no mucosal lesions of the tonsils, soft palate, posterior pharynx. No posterior oropharyngeal drainage.    Neck - no lymphadenopathy, no thyromegaly   Cardiovascular - heart regular rate and rhythm; extremities warm and well perfused  Pulmonary - breathing comfortably on quietly on room air; no stridor/stertor  Neurologic - cranial nerves 2-12 intact, patient oriented x3, appropriate mood    SINONASAL ENDOSCOPY (CPT 31231): To better evaluate the patient???s symptoms, sinonasal endoscopy is indicated.  After discussion of risks and benefits, and topical  decongestion and anesthesia, an endoscope was used to perform nasal endoscopy on each side.     Findings:   Examination on the left reveals an intact nasal septum with no associated masses, lesions, or friable mucosa. The left middle meatus, sphenoethmoidal recess, and skull base are clear with no evidence of purulence, polyposis, or polypoid edema.  There is minimal inspissated nasal secretion.    Examination on the right reveals an intact nasal septum with rightward deviation, touching the inferior turbinate head.  There are no associated masses, lesions, or friable mucosa. The right middle meatus, sphenoethmoidal recess, and skull base are clear with no evidence of purulence, polyposis, or polypoid edema.  Minimal inspissated nasal secretions    Test Results  Lab Results   Component Value Date    WBC 5.7 02/17/2022    HGB 8.9 (L) 02/17/2022    HCT 27.2 (L) 02/17/2022    PLT 245 02/17/2022       Lab Results   Component Value Date    NA 141 02/17/2022    K 3.8 02/17/2022    CL 107 02/17/2022    CO2 30.0 02/17/2022    BUN 10 02/17/2022    CREATININE 0.46 (L) 02/17/2022    GLU 132 02/17/2022    CALCIUM 8.6 (L) 02/17/2022    MG 1.9 02/15/2022    PHOS 4.0 02/15/2022       Lab Results   Component Value Date    BILITOT 0.3 02/12/2022    BILIDIR 0.20 12/04/2021    PROT 6.4 02/12/2022    ALBUMIN 3.4 02/12/2022    ALT 8 (L) 02/12/2022    AST 15 02/12/2022    ALKPHOS 39 (L) 02/12/2022    GGT 13 10/31/2011       Lab Results   Component Value Date    INR 1.05 02/12/2022    APTT 24.1 10/10/2020       Imaging: Radiology studies were personally reviewed

## 2022-02-17 NOTE — Unmapped (Signed)
Pt transferred from 1 obs. Received multiple doses of PRN pain meds- see MAR. One blood tinged stool on shift- provider notified, CT w/ contrast ordered. New NPO order in. A/Ox4, VS stable, call light within reach.    Problem: Adult Inpatient Plan of Care  Goal: Plan of Care Review  Outcome: Ongoing - Unchanged  Goal: Patient-Specific Goal (Individualized)  Outcome: Ongoing - Unchanged  Goal: Absence of Hospital-Acquired Illness or Injury  Outcome: Ongoing - Unchanged  Intervention: Identify and Manage Fall Risk  Recent Flowsheet Documentation  Taken 02/16/2022 1333 by Jiles Prows, RN  Safety Interventions:   fall reduction program maintained   infection management   lighting adjusted for tasks/safety   low bed   nonskid shoes/slippers when out of bed   no IV/BP/blood draw right arm  Intervention: Prevent Skin Injury  Recent Flowsheet Documentation  Taken 02/16/2022 1333 by Jiles Prows, RN  Positioning for Skin: Supine/Back  Device Skin Pressure Protection: adhesive use limited  Skin Protection: adhesive use limited  Intervention: Prevent Infection  Recent Flowsheet Documentation  Taken 02/16/2022 1333 by Jiles Prows, RN  Infection Prevention:   cohorting utilized   hand hygiene promoted  Goal: Optimal Comfort and Wellbeing  Outcome: Ongoing - Unchanged  Goal: Readiness for Transition of Care  Outcome: Ongoing - Unchanged  Goal: Rounds/Family Conference  Outcome: Ongoing - Unchanged     Problem: Latex Allergy  Goal: Absence of Allergy Symptoms  Outcome: Ongoing - Unchanged     Problem: Pain Acute  Goal: Optimal Pain Control and Function  Outcome: Ongoing - Unchanged     Problem: Malnutrition  Goal: Improved Nutritional Intake  Outcome: Ongoing - Unchanged

## 2022-02-17 NOTE — Unmapped (Signed)
Gastroenterology (Luminal) Consult Service   Progress Note         Assessment & Plan:   [name] [name] is a 21 y.o. female with a PMHx of [name] s/p IPA on TPN for nutritional support who presented to [facility] with acute on chronic anemia. The patient is seen in consultation at the request of Jeannine Boga,  MD (Oncology/Hematology (M[facility])) for hematochezia     #Acute on chronic anemia, stable  #Hematochezia, resolved  Pouchoscopy 12/7 with circumferential ulceration at the anastomosis, treated with APC. She has had no further hematochezia and Hgb stable. Heme onc consulted and agree to stop DOAC.      #Acute on chronic abdominal pain  #Gaseous distension of small bowel in pelvis  Patient has had increased pain since procedure likely secondary to acute worsening of her chronic abdominal pain and now with evidence of ileus. No evidence of SBO, with collapsed proximal small bowel and having BM. Etiology of symptoms/findings likely multifactorial secondary to known desmoid tumors and tethering of small bowel and narcotic pain medications. Recommend starting oral PAMORA to counteract narcotics. Overall goal will be gradual wean of narcotics back to her home regimen as able.  - Recommend starting oral PAMORA such as movantik  - Would attempt to wean narcotics as able back to home regimen    Recommendations discussed with the patient's primary team.    For questions, contact the on-call fellow for the Gastroenterology (Luminal) Consult Service.    Interval History:   KUB yesterday with mildly dilated gas-filled small bowel in the pelvis measuring up to 4 cm with gas extending to the anus. CT thereafter with gaseous distention of distal small bowel adjacent to mass, proximal small bowel loops are collapsed. Pain better controlled today but still feels bloated.Had small bowel movement this afternoon.    Objective:   Temp:  [36.3 ??C (97.3 ??F)-37 ??C (98.6 ??F)] 36.8 ??C (98.2 ??F)  Heart Rate:  [77-97] 77  Resp:  [16-18] 16  BP: (94-113)/(51-65) 101/51  SpO2:  [96 %-99 %] 98 %    Gen: WDWN female in NAD, answers questions appropriately  Abdomen: Mildly distended but soft abdomen with tenderness to deep palpation in B/L lower quadrants. No rebound or guarding.    Pertinent Labs/Studies Reviewed:  KUB and CT scans personally reviewed with dilated small bowel in pelvis but collapsed small bowel more proximal.    Lab Results   Component Value Date    WBC 5.7 02/17/2022    HGB 8.9 (L) 02/17/2022    HCT 27.2 (L) 02/17/2022    PLT 245 02/17/2022       Lab Results   Component Value Date    NA 141 02/17/2022    K 3.8 02/17/2022    CL 107 02/17/2022    CO2 30.0 02/17/2022    BUN 10 02/17/2022    CREATININE 0.46 (L) 02/17/2022    GLU 132 02/17/2022    CALCIUM 8.6 (L) 02/17/2022    MG 1.9 02/15/2022    PHOS 4.0 02/15/2022       Lab Results   Component Value Date    BILITOT 0.3 02/12/2022    BILIDIR 0.20 12/04/2021    PROT 6.4 02/12/2022    ALBUMIN 3.4 02/12/2022    ALT 8 (L) 02/12/2022    AST 15 02/12/2022    ALKPHOS 39 (L) 02/12/2022    GGT 13 10/31/2011       Lab Results   Component Value Date    PT  11.7 02/12/2022    INR 1.05 02/12/2022    APTT 24.1 10/10/2020

## 2022-02-17 NOTE — Unmapped (Signed)
Patient is alert and oriented x4, afebrile with stable VS. Pt with some c/o pain relieved by PRN pain medication and one c/o nausea relieved by PRN antiemetic. Up ad lib and independent. Fall precautions and pt safety maintained. Skin intact. No major events during the shift. Emotional and educational support provided.      Problem: Adult Inpatient Plan of Care  Goal: Plan of Care Review  Outcome: Ongoing - Unchanged  Goal: Patient-Specific Goal (Individualized)  Outcome: Ongoing - Unchanged  Goal: Absence of Hospital-Acquired Illness or Injury  Outcome: Ongoing - Unchanged  Intervention: Identify and Manage Fall Risk  Recent Flowsheet Documentation  Taken 02/17/2022 0831 by Joyice Faster, RN  Safety Interventions:   low bed   lighting adjusted for tasks/safety   fall reduction program maintained   environmental modification   infection management   nonskid shoes/slippers when out of bed  Intervention: Prevent Skin Injury  Recent Flowsheet Documentation  Taken 02/17/2022 0831 by Joyice Faster, RN  Device Skin Pressure Protection: absorbent pad utilized/changed  Skin Protection: adhesive use limited  Intervention: Prevent Infection  Recent Flowsheet Documentation  Taken 02/17/2022 0831 by Joyice Faster, RN  Infection Prevention:   cohorting utilized   hand hygiene promoted  Goal: Optimal Comfort and Wellbeing  Outcome: Ongoing - Unchanged  Goal: Readiness for Transition of Care  Outcome: Ongoing - Unchanged  Goal: Rounds/Family Conference  Outcome: Ongoing - Unchanged     Problem: Latex Allergy  Goal: Absence of Allergy Symptoms  Outcome: Ongoing - Unchanged     Problem: Pain Acute  Goal: Optimal Pain Control and Function  Outcome: Ongoing - Unchanged     Problem: Malnutrition  Goal: Improved Nutritional Intake  Outcome: Ongoing - Unchanged

## 2022-02-17 NOTE — Unmapped (Signed)
Currently admitted into the hospital

## 2022-02-17 NOTE — Unmapped (Signed)
Oncology (MEDO) Progress Note    Assessment & Plan:   [name] [name] is a 21 y.o. 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       Principal Problem:    GI bleeding  Active Problems:    [name]    [name]    Pain    Iron deficiency anemia due to chronic blood loss    History of colectomy  Resolved Problems:    * No resolved hospital problems. *      Active Problems    Concern for GI Bleeding - Acute on Chronic anemia - Iron deficiency Anemia  Patient coming in after notable hemoglobin drop on outpatient labs to 7.6 from baseline of 11 with associated bright red blood per rectum over the last 5 days. Had recent hospitalization for similar presentation, bleeding at the time attributed to anal fissures. Underwent endoscopy 12/7 with GI which showed bleeding ulcer at ileoanal anastamosis. This may have been exacerbated because patient was on eliquis. On 12/8 AM, hgb was 6.9 requiring 1 unit of PRBC. Responded appropriately with repeat hgb of 9.2. Per patient, she has had significant menstrual bleeding which is also a source of the blood loss. Had one instance of blood in BM today, much smaller than before. Will continue to monitor  - Maintain active type and screen  - Discontinue eliquis per hematology recs  - Daily H&H  - Continue IV PPI BID    Acute on chronic abdominal pain 2/2 desmoid tumors  Patient's baseline pain is LLQ and now presenting with RLQ pulling, tugging pain. CT abdomen pelvis with IV contrast done in the ED does show unchanged size and distribution of multiple mesenteric masses compatible with desmoid tumors. However there is also new R adnexal cyst noted. Patient is diffusely tender to palpation but specifies that RLQ is where pain is worse/different. Patient also reports that she is currently menstruating which may be contributing to the pain. Continues to have similar to slightly worsening abdominal pain. KUB demonstrates partial SBO vs ileus baded on gas pattern however patient continues to have bowel movements. However, due to worsening pain, discussed with radiology best imaging CT A/P vs CTE. At this time, will obtain CT A/P to better evaluate as she has known masses tethering her bowels.   -- Continue tylenol 650 mg Q6h prn   - Continue oxycodone 10 mg every 4 hours PRN  - IV Dilaudid 1 mg every 3 hours as needed for breakthrough pain  -- CT A/P pending     Severe Malnutrition 2/2 [name] and abdominal desmoid tumors   On previous admission she was discharged with a Corpak and was ultimately transitioned to TPN.  She is currently receiving TPN every day and this is being followed by Dr. [name], outpatient GI. TPN resumed 12/7.  - Nutrition consulted for TPN recommendations     [name]/Desmoid Tumors   Follows with Dr. [name].  - Hold sorafenib daily as it can cause bleeding, fistulas  -- Will defer restarting to outpatient oncologist    Hx of PICC Line associated clot   She was treated for this back in January and given recent reinsertion of PICC line for TPN was started on a prophylactic dose of Eliquis.  - Continue to hold this in the setting of GI bleed per  above  -- Hematology consulted 12/8, recommend holding eliquis in setting of recurrent GI bleeds as there is no current clot and eliquis was being used prophylactically     Chronic Problems  Anxiety   - Continue Zyprexa 7.5 mg nightly  - Continue Remeron 7.5 nightly  - Continue gabapentin 200 mg nightly     Hx of Pouchitis   Patient has been on cefdinir for this and was recently decreased the dose by her GI doctor for prophylactic dosing. On last scope in November, noted to not have active pouchitis.  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: recurring with re-initiation of sorafenib  -Continue clobetasol 0.05%    Issues Impacting Complexity of Management:      Medical Decision Making: Discussed the patient's management and/or test interpretation with Gastroenterology as summarized within this note      Daily Checklist:  Diet: Regular Diet  DVT PPx: Contraindicated - High Risk for Bleeding/Active Bleeding  Electrolytes: Replete Potassium to >/= 3.6 and Magnesium to >/= 1.8  Code Status: Full Code  Dispo: Home    Team Contact Information:   Primary Team: Oncology (MEDO)  Primary Resident: [name], MD, MD  Resident's Pager: [phone] (Oncology Intern - Tower)    Interval History:   Patient reports severe RLQ pain that similar to slightly worse compared to yesterday with the same qualities- a tugging, aching pain. Continues to have a headache and reports it being one of the worst headaches of her life. CT head ordered with no read back yet. Due to continuing worsening pain, will reimage.     All other systems were reviewed and are negative except as noted in the HPI    Objective:   Temp:  [36.4 ??C (97.5 ??F)-37 ??C (98.6 ??F)] 37 ??C (98.6 ??F)  Heart Rate:  [73-103] 97  Resp:  [17-19] 18  BP: (94-113)/(52-65) 102/55  SpO2:  [96 %-99 %] 98 %    Gen: NAD, converses   HENT: atraumatic, normocephalic  Heart: RRR  Lungs: CTAB, no crackles or wheezes  Abdomen: diffuse abdominal tenderness with significant pain on palpation in bilateral lower quadrants R>L  Extremities: No edema

## 2022-02-18 ENCOUNTER — Telehealth: Payer: Self-pay | Admitting: Physician Assistant

## 2022-02-18 LAB — BASIC METABOLIC PANEL
ANION GAP: 6 mmol/L (ref 5–14)
BLOOD UREA NITROGEN: 14 mg/dL (ref 9–23)
BUN / CREAT RATIO: 29
CALCIUM: 8.7 mg/dL (ref 8.7–10.4)
CHLORIDE: 107 mmol/L (ref 98–107)
CO2: 29 mmol/L (ref 20.0–31.0)
CREATININE: 0.48 mg/dL — ABNORMAL LOW
EGFR CKD-EPI (2021) FEMALE: >90 mL/min/{1.73_m2} (ref >=60)
GLUCOSE RANDOM: 124 mg/dL (ref 70–179)
POTASSIUM: 3.6 mmol/L (ref 3.4–4.8)
SODIUM: 142 mmol/L (ref 135–145)

## 2022-02-18 LAB — CBC
HEMATOCRIT: 25.6 % — ABNORMAL LOW (ref 34.0–44.0)
HEMOGLOBIN: 8.5 g/dL — ABNORMAL LOW (ref 11.3–14.9)
MEAN CORPUSCULAR HEMOGLOBIN CONC: 33.1 g/dL (ref 32.0–36.0)
MEAN CORPUSCULAR HEMOGLOBIN: 28.6 pg (ref 25.9–32.4)
MEAN CORPUSCULAR VOLUME: 86.2 fL (ref 77.6–95.7)
MEAN PLATELET VOLUME: 9.8 fL (ref 6.8–10.7)
PLATELET COUNT: 248 10*9/L (ref 150–450)
RED BLOOD CELL COUNT: 2.97 10*12/L — ABNORMAL LOW (ref 3.95–5.13)
RED CELL DISTRIBUTION WIDTH: 15.4 % — ABNORMAL HIGH (ref 12.2–15.2)
WBC ADJUSTED: 5.9 10*9/L (ref 3.6–11.2)

## 2022-02-18 LAB — MAGNESIUM: MAGNESIUM: 2 mg/dL (ref 1.6–2.6)

## 2022-02-18 LAB — PHOSPHORUS: PHOSPHORUS: 5 mg/dL (ref 2.4–5.1)

## 2022-02-18 MED ORDER — OLANZAPINE 7.5 MG TABLET
ORAL_TABLET | 1 refills | 0 days
Start: 2022-02-18 — End: ?

## 2022-02-18 MED ADMIN — OLANZapine (ZyPREXA) tablet 7.5 mg: 7.5 mg | ORAL | @ 03:00:00

## 2022-02-18 MED ADMIN — polyethylene glycol (MIRALAX) packet 17 g: 17 g | ORAL | @ 15:00:00

## 2022-02-18 MED ADMIN — fat emulsion 20 % with fish oil (SMOFLIPID) infusion 250 mL: 250 mL | INTRAVENOUS | @ 05:00:00 | Stop: 2022-02-18

## 2022-02-18 MED ADMIN — clobetasoL (TEMOVATE) 0.05 % ointment: TOPICAL | @ 15:00:00

## 2022-02-18 MED ADMIN — HYDROmorphone (PF) (DILAUDID) injection 1 mg: 1 mg | INTRAVENOUS | @ 18:00:00 | Stop: 2022-02-18

## 2022-02-18 MED ADMIN — HYDROmorphone (PF) (DILAUDID) injection 1 mg: 1 mg | INTRAVENOUS | @ 11:00:00 | Stop: 2022-02-18

## 2022-02-18 MED ADMIN — gabapentin (NEURONTIN) capsule 200 mg: 200 mg | ORAL | @ 03:00:00

## 2022-02-18 MED ADMIN — HYDROmorphone (PF) (DILAUDID) injection 1 mg: 1 mg | INTRAVENOUS | @ 15:00:00 | Stop: 2022-02-18

## 2022-02-18 MED ADMIN — oxyCODONE (ROXICODONE) immediate release tablet 10 mg: 10 mg | ORAL | Stop: 2022-02-26

## 2022-02-18 MED ADMIN — oxyCODONE (ROXICODONE) immediate release tablet 15 mg: 15 mg | ORAL | @ 22:00:00 | Stop: 2022-03-04

## 2022-02-18 MED ADMIN — HYDROmorphone (PF) (DILAUDID) injection 1 mg: 1 mg | INTRAVENOUS | @ 08:00:00 | Stop: 2022-02-18

## 2022-02-18 MED ADMIN — Parenteral Nutrition (CENTRAL): INTRAVENOUS | @ 05:00:00 | Stop: 2022-02-19

## 2022-02-18 MED ADMIN — acetaminophen (TYLENOL) tablet 1,000 mg: 1000 mg | ORAL | @ 11:00:00

## 2022-02-18 MED ADMIN — cefdinir (OMNICEF) capsule 300 mg: 300 mg | ORAL | @ 15:00:00 | Stop: 2022-02-27

## 2022-02-18 MED ADMIN — ondansetron (ZOFRAN-ODT) disintegrating tablet 4 mg: 4 mg | ORAL | @ 09:00:00

## 2022-02-18 MED ADMIN — oxyCODONE (ROXICODONE) immediate release tablet 10 mg: 10 mg | ORAL | @ 05:00:00 | Stop: 2022-02-18

## 2022-02-18 MED ADMIN — HYDROmorphone (PF) (DILAUDID) injection 1 mg: 1 mg | INTRAVENOUS | @ 04:00:00 | Stop: 2022-03-01

## 2022-02-18 MED ADMIN — HYDROmorphone (PF) (DILAUDID) injection 1 mg: 1 mg | INTRAVENOUS | @ 20:00:00 | Stop: 2022-02-18

## 2022-02-18 MED ADMIN — ammonium lactate (LAC-HYDRIN) 12 % lotion 1 Application: 1 | TOPICAL | @ 15:00:00

## 2022-02-18 MED ADMIN — acetaminophen (TYLENOL) tablet 1,000 mg: 1000 mg | ORAL | @ 18:00:00

## 2022-02-18 MED ADMIN — acetaminophen (TYLENOL) tablet 1,000 mg: 1000 mg | ORAL | @ 23:00:00

## 2022-02-18 MED ADMIN — mirtazapine (REMERON) tablet 7.5 mg: 7.5 mg | ORAL | @ 02:00:00

## 2022-02-18 MED ADMIN — HYDROmorphone (PF) (DILAUDID) injection 1 mg: 1 mg | INTRAVENOUS | @ 02:00:00 | Stop: 2022-03-01

## 2022-02-18 MED ADMIN — sodium chloride (OCEAN) 0.65 % nasal spray 1 spray: 1 | NASAL | @ 18:00:00

## 2022-02-18 MED ADMIN — ondansetron (ZOFRAN) injection 4 mg: 4 mg | INTRAVENOUS | @ 15:00:00

## 2022-02-18 NOTE — Unmapped (Signed)
Department of Anesthesiology  Pain Medicine Division    Chronic Pain Consult Note      Requesting Attending Physician:  Jeannine Boga, MD  Service Requesting Consult:  Oncology/Hematology (MDE)    Assessment/Recommendations:  The patient was seen in consultation on request of Jeannine Boga, MD regarding assistance with pain management. The patient is obtaining adequate pain relief on current medication regimen.     The patient is a 21 y.o. female with [name] (familial adenomatosis polyposis) s/p proctocolectomy with ileoanal anastomosis, desmoid tumors, anemia, severe protein calorie malnutrition that presented to [facility] with GI bleed and anemia. Patient has since been in the hospital for pain management.     Chronic pain was consulted for management of patient's pain as patient does follow at the [facility] pain clinic and has acute on chronic pain. Patient states she was doing well outside of the hospital but started to have some weakness and fatigue a day or two prior to coming to the hospital. She was found to have a gI bleed and since then, she has been having some right sided abdominal pain. Patient states it feels slightly different than before however she is currently on her period as well which may be causing her some discomfort.    Upon discussion with GI team outside of patient's room, it was mentioned that this acuity and severity of patient's pain is outside the scope of her current admission diagnosis.  GI team stressed the importance of transitioning from IV pain medications to oral medications as patient is able to tolerate by mouth.  When discussing this with the patient, patient was very frustrated and triggered.  Patient stated that she is having desmoid related pain and the GI team does not understand.  We discussed that instead of removing her IV medications completely, we can convert her IV dosage into a p.o. regimen and allow her to receive the same amount of opioids as she was previously. Patient and mother agreed to this plan.  Upon reviewing possible options for p.o. transition, patient stated she would be okay with taking p.o. oxycodone instead of p.o. Dilaudid as she does not like the way that that makes her feel.  We discussed that we can continue her oxycodone 10 mg every 4 hours with the as needed or we can allow her to receive 10 to 15 mg every 4 hours as needed.  Patient and mother elected to choose the second plan.  We discussed that if choosing this option, the patient would not have any as needed medication and she expressed understanding to this plan.    Recommendations:  -The chronic pain service is a consult service and does not place orders, just makes recommendations (except ketamine and lidocaine infusions)  -Please evaluate all patients on opioids for appropriateness of prescribing narcan at discharge.  The chronic pain service can assist with this.  Nasal narcan covered by most insurance.  -Recommendations given apply to the current hospitalization and do not reflect long term recommendations.  - Continue Tylenol 1000 mg q8h  - Encourage use of Baclofen 5-10 mg BID PRN  - Continue Gabapentin 200 mg at bedtime  - Transition IV HM 0.5-1 mg q2h PRN morphine milli-equivalents to PO by changing PO oxycodone to 10-15 mg q4h PRN   - Discussed that if we choose this route, patient would NOT have another opioid breakthru option for pain besides the oxycodone and she agreed to this plan  - Start cymbalta 20 mg daily  We will continue to follow.    Medications trialed during this admission (or pertinent in past):   Tylenol 1000 mg q8h  Baclofen 5-10 mg BID PRN  Gabapentin 200 mg at bedtime  HM IV 0.5-1 mg q2h PRN  Oxycodone 10 mg q4h PRN    Naloxone Rx at discharge?  Is patient on opioids? Yes.  1)Is dose >50MME?  Yes.  2) Is patient prescribed a benzodiazepine (w opioids)? No.  3)Hx of overdose?  No.  4) Hx of substance use disorder? No.  5) Opioids likely to last greater than a week after discharge? Yes.    If yes to 2 or more, prescribe naloxone at discharge.  Nasal narcan for most insured (Nasal narcan 4mg /actuation, prescribe 1 kit, instructions at [email or web address]).  For uninsured, chronic pain can work to assist in finding an option.  OTC nasal narcan now available at most pharmacies for around $45.    History of Present Illness:    Reason for Consult:  acute on chronic pain    [name] [name] is seen in consultation at the request of Sanjana Madishetty for evaluation of her acute on chronic pain.  Patient states she was doing very well after being discharged from the hospital in November.  She stated that she was able to sleep and perform daily activities throughout the day for 12-hour intervals before needing to take pain medications.  She stated that she has not started to utilize the [facility] patch that she was prescribed by [name] at this time as she was trying to get off of her pain medications.  However she felt weak and fatigue 1 day and was then told to come to the hospital due to being anemic.  Patient states that she is currently having desmoid related tumor pain in her abdomen.  She states that her current regimen is helping however her doctors are trying to remove her IV medications.  Patient states she does not like the way that Dilaudid p.o. makes her feel so she prefers oxycodone.    For the pain, the patient's current medication regimen includes:  Butrans patch   Baclofen 5-10 mg BID PRN  Gabapentin 200 mg at bedtime  HM PO 2 q4h PRN  Oxycodone 10 mg q4h PRN  Cymbalta 20 mg daily     Prior to admission, the patient was on home opiate medications.  Shehas been followed by a pain clinic.  The [place] CSRS was reviewed.    Allergies    Allergies   Allergen Reactions    Adhesive Tape-Silicones Hives and Rash     Paper tape  And tegederm ok    Ferrlecit [Sodium Ferric Gluconat-Sucrose] Swelling and Rash    Levofloxacin Swelling and Rash     Swelling in mouth, rash, Methylnaltrexone      Per Patient: I lost bowel control, severe abdominal cramping, and elevated BP    Neomycin Swelling     Rxn after ear drops; ear swelling    Papaya Hives    Morphine Nausea And Vomiting    Zosyn [Piperacillin-Tazobactam] Itching and Rash     Red and itchy    Compazine [Prochlorperazine] Other (See Comments)     Extreme agitation    Latex, Natural Rubber Rash       Home Medications    Medications Prior to Admission   Medication Sig Dispense Refill Last Dose    apixaban (ELIQUIS) 2.5 mg Tab Take 1 tablet (2.5 mg total) by mouth two (2) times  a day. 60 tablet 0 02/11/2022    acetaminophen (TYLENOL) 500 MG tablet Take 2 tablets (1,000 mg total) by mouth Three (3) times a day as needed.       ammonium lactate (LAC-HYDRIN) 12 % lotion Apply 1 Application topically two (2) times a day.       baclofen (LIORESAL) 5 mg Tab tablet Take 1-2 tablets (5-10 mg total) by mouth two (2) times a day as needed for muscle spasms. 40 tablet 0     buprenorphine 10 mcg/hour PTWK transdermal patch Place 1 patch on the skin every seven (7) days. (Patient not taking: Reported on 02/13/2022) 4 patch 2 Not Taking    cefdinir (OMNICEF) 300 MG capsule Take 1 capsule (300 mg total) by mouth Two (2) times a day. (Patient taking differently: Take 1 capsule (300 mg total) by mouth daily.) 180 capsule 1     clobetasoL (TEMOVATE) 0.05 % ointment Apply topically two (2) times a day. To stubborn/thick rashes on hands/feet ears until clear/smooth. Restart as needed 60 g 3     diclofenac sodium (VOLTAREN) 1 % gel Apply 2 g topically four (4) times a day. 100 g 0     diphenhydrAMINE (BENADRYL) 50 mg capsule Take 1 capsule (50 mg total) by mouth every six (6) hours as needed for itching (per patient preference (if prefers over hydroxyzine)). 30 each 0     famotidine (PEPCID) 20 MG tablet Take 1 tablet (20 mg total) by mouth nightly. 90 tablet 3     gabapentin (NEURONTIN) 100 MG capsule Take 2 capsules (200 mg total) by mouth nightly. 60 capsule 0     hydrocortisone (ANUSOL-HC) 25 mg suppository Insert 1 suppository (25 mg total) into the rectum two (2) times a day as needed for hemorrhoids (possible rectal/anal bleeding source). 30 suppository 0     hydrocortisone 2.5 % cream Apply 1 application. topically daily as needed. (Patient not taking: Reported on 02/13/2022)   Not Taking    hyoscyamine (LEVSIN) 0.125 mg tablet Take 1 tablet (0.125 mg total) by mouth every four (4) hours as needed for cramping.       lidocaine (LIDODERM) 5 % patch Place 1 patch on the skin daily. Apply to affected area for 12 hours only each day (then remove patch)       menthol-zinc oxide (CALMOSEPTINE) 0.44-20.6 % Oint Apply topically four (4) times a day as needed. 113 g 11     mirtazapine (REMERON) 7.5 MG tablet Take 1 tablet (7.5 mg total) by mouth nightly. 90 tablet 3     naloxone (NARCAN) 4 mg nasal spray One spray in either nostril once for known/suspected opioid overdose. May repeat every 2-3 minutes in alternating nostril til EMS arrives 2 each 0     NIFEdipine 0.3% lidocaine 1.5% in petrolatum ointment Apply topically two (2) times a day. 30 g 0     OLANZapine (ZYPREXA) 7.5 MG tablet Take 1 tablet (7.5 mg total) by mouth nightly. 30 tablet 2     ondansetron (ZOFRAN-ODT) 8 MG disintegrating tablet Take 1 tablet (8 mg total) by mouth every twelve (12) hours as needed for nausea.       oxyCODONE (ROXICODONE) 10 mg immediate release tablet Take 1 tablet (10 mg total) by mouth every four (4) hours as needed for pain. Fill on or after: 03/01/22 100 tablet 0     pimecrolimus (ELIDEL) 1 % cream Apply topically two (2) times a day. To face as needed for rash 30  g 11     silver sulfaDIAZINE (SILVADENE) 1 % cream Apply topically daily. To burn on leg 50 g 11     SLEEPY BUTTER Swish and spit 10 mL 4 times a day as needed. 300 mL 3     sodium/pot/mag/calc/chlor/acet (TPN ELECTROLYTES IV) Receiving IV TPN       SORAfenib (NEXAVAR) 200 mg tablet Take 1 tablet (200 mg total) by mouth daily. 60 tablet 2     triamcinolone (KENALOG) 0.1 % cream Apply topically two (2) times a day. 28 g 3     zinc oxide 10 % Crea Apply 1 application. topically daily as needed.          Inpatient Medications  Current Facility-Administered Medications   Medication Dose Route Frequency Provider Last Rate Last Admin    acetaminophen (TYLENOL) tablet 1,000 mg  1,000 mg Oral TID [name], MD   1,000 mg at 02/18/22 1239    ammonium lactate (LAC-HYDRIN) 12 % lotion 1 Application  1 Application Topical BID Mack Guise, MD   1 Application at 02/18/22 0949    baclofen (LIORESAL) tablet 5-10 mg  5-10 mg Oral BID PRN Madishetty, Micki Riley, MD        cefdinir (OMNICEF) capsule 300 mg  300 mg Oral Daily Madishetty, Sanjana, MD   300 mg at 02/18/22 0948    clobetasoL (TEMOVATE) 0.05 % ointment   Topical BID Madishetty, Micki Riley, MD   Given at 02/18/22 0949    emollient combination no.92 (LUBRIDERM) lotion   Topical Continuous PRN Madishetty, Micki Riley, MD        Melene Muller ON 02/19/2022] Parenteral Nutrition (CENTRAL)   Intravenous Continuous Jeannine Boga, MD        And    Melene Muller ON 02/19/2022] fat emulsion 20 % with fish oil (SMOFlipid) infusion 250 mL  250 mL Intravenous Continuous Jeannine Boga, MD        gabapentin (NEURONTIN) capsule 200 mg  200 mg Oral Nightly Madishetty, Sanjana, MD   200 mg at 02/17/22 2131    HYDROmorphone (PF) (DILAUDID) injection 0.5 mg  0.5 mg Intravenous Q2H PRN Mack Guise, MD        Or    HYDROmorphone (PF) (DILAUDID) injection 1 mg  1 mg Intravenous Q2H PRN Mack Guise, MD   1 mg at 02/18/22 1238    mirtazapine (REMERON) tablet 7.5 mg  7.5 mg Oral Nightly Madishetty, Sanjana, MD   7.5 mg at 02/17/22 2129    OLANZapine (ZyPREXA) tablet 7.5 mg  7.5 mg Oral Nightly Madishetty, Sanjana, MD   7.5 mg at 02/17/22 2131    ondansetron (ZOFRAN-ODT) disintegrating tablet 4 mg  4 mg Oral Q8H PRN Madishetty, Sanjana, MD   4 mg at 02/18/22 0330    Or    ondansetron (ZOFRAN) injection 4 mg  4 mg Intravenous Q8H PRN Madishetty, Sanjana, MD   4 mg at 02/18/22 0948    oxyCODONE (ROXICODONE) immediate release tablet 10 mg  10 mg Oral Q4H PRN Madishetty, Micki Riley, MD   10 mg at 02/18/22 9147    Parenteral Nutrition (CENTRAL)   Intravenous Continuous Jeannine Boga, MD 100 mL/hr at 02/18/22 0019 New Bag at 02/18/22 0019    polyethylene glycol (MIRALAX) packet 17 g  17 g Oral Daily [name], MD   17 g at 02/18/22 1000    sodium chloride (OCEAN) 0.65 % nasal spray 1 spray  1 spray Each Nare QID PRN Nyoka Lint, MD   1 spray at 02/18/22 1246  Past Medical History    Past Medical History:   Diagnosis Date    Abdominal pain     Acid reflux     occas    Anesthesia complication     itching, shaking, coldness; last few surgeries have gone much better    Cancer (CMS-HCC)     Cataract of right eye     COVID-19 virus infection 01/2019    Cyst of thyroid determined by ultrasound     monitoring    [name]     2 right forearm, 1 left thigh, 1 right scapula, 1 under left clavicle; multiple    Difficult intravenous access     [name] (familial adenomatous polyposis)     [name]     Gastric polyps     History of chemotherapy     last treatment approx 05/2019    History of colon polyps     History of COVID-19 01/2019    Iron deficiency anemia due to chronic blood loss     received iron infusion 11-2019    PONV (postoperative nausea and vomiting)     Rectal bleeding     Syncopal episodes     especially if becoming dehydrated           Past Surgical History    Past Surgical History:   Procedure Laterality Date    cyroablation      cystis removal      desmoid removal      PR CLOSE ENTEROSTOMY,RESEC+ANAST N/A 10/09/2020    Procedure: ILEOSTOMY TAKEDOWN;  Surgeon: Mickle Asper, MD;  Location: OR [place];  Service: General Surgery    PR COLONOSCOPY W/BIOPSY SINGLE/MULTIPLE N/A 10/27/2012    Procedure: COLONOSCOPY, FLEXIBLE, PROXIMAL TO SPLENIC FLEXURE; WITH BIOPSY, SINGLE OR MULTIPLE;  Surgeon: Shirlyn Goltz Mir, MD;  Location: PEDS PROCEDURE ROOM [place];  Service: Gastroenterology    PR COLONOSCOPY W/BIOPSY SINGLE/MULTIPLE N/A 09/14/2013    Procedure: COLONOSCOPY, FLEXIBLE, PROXIMAL TO SPLENIC FLEXURE; WITH BIOPSY, SINGLE OR MULTIPLE;  Surgeon: Shirlyn Goltz Mir, MD;  Location: PEDS PROCEDURE ROOM [place];  Service: Gastroenterology    PR COLONOSCOPY W/BIOPSY SINGLE/MULTIPLE N/A 11/08/2014    Procedure: COLONOSCOPY, FLEXIBLE, PROXIMAL TO SPLENIC FLEXURE; WITH BIOPSY, SINGLE OR MULTIPLE;  Surgeon: Arnold Long Mir, MD;  Location: PEDS PROCEDURE ROOM [facility];  Service: Gastroenterology    PR COLONOSCOPY W/BIOPSY SINGLE/MULTIPLE N/A 12/26/2015    Procedure: COLONOSCOPY, FLEXIBLE, PROXIMAL TO SPLENIC FLEXURE; WITH BIOPSY, SINGLE OR MULTIPLE;  Surgeon: Arnold Long Mir, MD;  Location: PEDS PROCEDURE ROOM [facility];  Service: Gastroenterology    PR COLONOSCOPY W/BIOPSY SINGLE/MULTIPLE N/A 09/02/2017    Procedure: COLONOSCOPY, FLEXIBLE, PROXIMAL TO SPLENIC FLEXURE; WITH BIOPSY, SINGLE OR MULTIPLE;  Surgeon: Arnold Long Mir, MD;  Location: PEDS PROCEDURE ROOM [place];  Service: Gastroenterology    PR COLSC FLX W/REMOVAL LESION BY HOT BX FORCEPS N/A 08/27/2016    Procedure: COLONOSCOPY, FLEXIBLE, PROXIMAL TO SPLENIC FLEXURE; W/REMOVAL TUMOR/POLYP/OTHER LESION, HOT BX FORCEP/CAUTE;  Surgeon: Arnold Long Mir, MD;  Location: PEDS PROCEDURE ROOM [facility];  Service: Gastroenterology    PR COLSC FLX W/RMVL OF TUMOR POLYP LESION SNARE TQ N/A 02/25/2019    Procedure: COLONOSCOPY FLEX; W/REMOV TUMOR/LES BY SNARE;  Surgeon: Helyn Numbers, MD;  Location: GI PROCEDURES [facility] [facility];  Service: Gastroenterology    PR COLSC FLX W/RMVL OF TUMOR POLYP LESION SNARE TQ N/A 03/13/2020    Procedure: COLONOSCOPY FLEX; W/REMOV TUMOR/LES BY SNARE;  Surgeon: Helyn Numbers, MD;  Location: GI PROCEDURES [place] [facility];  Service:  Gastroenterology    PR EXC SKIN BENIG 2.1-3 CM TRUNK,ARM,LEG Right 02/25/2017    Procedure: EXCISION, BENIGN LESION INCLUDE MARGINS, EXCEPT SKIN TAG, LEGS; EXCISED DIAMETER 2.1 TO 3.0 CM;  Surgeon: Clarene Duke, MD;  Location: CHILDRENS OR [facility];  Service: Plastics    PR EXC SKIN BENIG 3.1-4 CM TRUNK,ARM,LEG Right 02/25/2017    Procedure: EXCISION, BENIGN LESION INCLUDE MARGINS, EXCEPT SKIN TAG, ARMS; EXCISED DIAMETER 3.1 TO 4.0 CM;  Surgeon: Clarene Duke, MD;  Location: CHILDRENS OR [facility];  Service: Plastics    PR EXC SKIN BENIG >4 CM FACE,FACIAL Right 02/25/2017    Procedure: EXCISION, OTHER BENIGN LES INCLUD MARGIN, FACE/EARS/EYELIDS/NOSE/LIPS/MUCOUS MEMBRANE; EXCISED DIAM >4.0 CM;  Surgeon: Clarene Duke, MD;  Location: CHILDRENS OR [facility];  Service: Plastics    PR EXC TUMOR SOFT TISSUE LEG/ANKLE SUBQ 3+CM Right 08/05/2019    Procedure: EXCISION, TUMOR, SOFT TISSUE OF LEG OR ANKLE AREA, SUBCUTANEOUS; 3 CM OR GREATER;  Surgeon: Arsenio Katz, MD;  Location: MAIN OR [place];  Service: Plastics    PR EXC TUMOR SOFT TISSUE LEG/ANKLE SUBQ <3CM Right 08/05/2019    Procedure: EXCISION, TUMOR, SOFT TISSUE OF LEG OR ANKLE AREA, SUBCUTANEOUS; LESS THAN 3 CM;  Surgeon: Arsenio Katz, MD;  Location: MAIN OR [facility];  Service: Plastics    PR LAP, SURG PROCTECTOMY W J-POUCH N/A 08/10/2020    Procedure: ROBOTIC ASSISTED LAPAROSCOPIC PROCTOCOLECTOMY, ILEAL J POUCH, WITH OSTOMY;  Surgeon: Mickle Asper, MD;  Location: OR [place];  Service: General Surgery    PR NDSC EVAL INTSTINAL POUCH DX W/COLLJ SPEC SPX N/A 01/23/2021    Procedure: ENDO EVAL SM INTEST POUCH; DX;  Surgeon: Modena Nunnery, MD;  Location: [facility] PROCED[facility] [place] [facility];  Service: Gastroenterology    PR NDSC EVAL INTSTINAL POUCH DX W/COLLJ SPEC SPX N/A 08/27/2021    Procedure: ENDO EVAL SM INTEST POUCH; DX;  Surgeon: Hunt Oris, MD;  Location: [facility] PROC[facility]URES [facility];  Service: Gastroenterology    PR NDSC EVAL INTSTINAL POUCH DX W/COLLJ SPEC SPX N/A 12/09/2021    Procedure: ENDO EVAL SM INTEST POUCH; DX;  Surgeon: Vidal Schwalbe, MD; Location: GI PROCEDURES [facility];  Service: Gastroenterology    PR NDSC EVAL INTSTINAL POUCH W/BX SINGLE/MULTIPLE N/A 01/20/2022    Procedure: ENDOSCOPIC EVAL OF SMALL INTESTINAL POUCH; DIAGNOSTIC, No biopsies;  Surgeon: Andrey Farmer, MD;  [facility]: [facility] PROCEDURES [facility];  Service: Gastroenterology    PR NDSC EVAL INTSTINAL POUCH W/BX SINGLE/MULTIPLE N/A 02/13/2022    Procedure: ENDOSCOPIC EVAL OF SMALL INTESTINAL POUCH; DIAGNOSTIC, WITH BIOPSY;  Surgeon: Bronson Curb, MD;  Location: GI PROCEDURES [facility];  Service: Gastroenterology    PR UNLISTED PROCEDURE SMALL INTESTINE  01/23/2021    Procedure: UNLISTED PROCEDURE, SMALL INTESTINE;  Surgeon: Modena Nunnery, MD;  Location: [facility] PROCEDURES [place] [facility];  Service: Gastroenterology    PR UNLISTED PROCEDURE SMALL INTESTINE  02/13/2022    Procedure: UNLISTED PROCEDURE, SMALL INTESTINE;  Surgeon: Bronson Curb, MD;  Location: GI PROCEDURES [facility];  Service: Gastroenterology    PR UPPER GI ENDOSCOPY,BIOPSY N/A 10/27/2012    Procedure: UGI ENDOSCOPY; WITH BIOPSY, SINGLE OR MULTIPLE;  Surgeon: Shirlyn Goltz Mir, MD;  Location: PEDS PROCEDURE ROOM [facility];  Service: Gastroenterology    PR UPPER GI ENDOSCOPY,BIOPSY N/A 09/14/2013    Procedure: UGI ENDOSCOPY; WITH BIOPSY, SINGLE OR MULTIPLE;  Surgeon: Shirlyn Goltz Mir, MD;  Location: PEDS PROCEDURE ROOM [facility];  Service: Gastroenterology    PR UPPER GI ENDOSCOPY,BIOPSY  N/A 11/08/2014    Procedure: UGI ENDOSCOPY; WITH BIOPSY, SINGLE OR MULTIPLE;  Surgeon: Arnold Long Mir, MD;  Location: PEDS PROCEDURE ROOM [facility];  Service: Gastroenterology    PR UPPER GI ENDOSCOPY,BIOPSY N/A 12/26/2015    Procedure: UGI ENDOSCOPY; WITH BIOPSY, SINGLE OR MULTIPLE;  Surgeon: Arnold Long Mir, MD;  Location: PEDS PROCEDURE ROOM [facility];  Service: Gastroenterology    PR UPPER GI ENDOSCOPY,BIOPSY N/A 08/27/2016    Procedure: UGI ENDOSCOPY; WITH BIOPSY, SINGLE OR MULTIPLE;  Surgeon: Arnold Long Mir, MD; Location: PEDS PROCEDURE ROOM [facility];  Service: Gastroenterology    PR UPPER GI ENDOSCOPY,BIOPSY N/A 09/02/2017    Procedure: UGI ENDOSCOPY; WITH BIOPSY, SINGLE OR MULTIPLE;  Surgeon: Arnold Long Mir, MD;  Location: PEDS PROCEDURE ROOM [facility];  Service: Gastroenterology    PR UPPER GI ENDOSCOPY,BIOPSY N/A 03/13/2020    Procedure: UGI ENDOSCOPY; WITH BIOPSY, SINGLE OR MULTIPLE;  Surgeon: Helyn Numbers, MD;  [facility]: [facility] PROCED[facility] [place] [facility];  Service: Gastroenterology    PR UPPER GI ENDOSCOPY,BIOPSY N/A 09/05/2021    Procedure: UGI ENDOSCOPY; WITH BIOPSY, SINGLE OR MULTIPLE;  Surgeon: Wendall Papa, MD;  Location: GI PROCEDURES [facility];  Service: Gastroenterology    PR UPPER GI ENDOSCOPY,DIAGNOSIS N/A 01/20/2022    Procedure: UGI ENDO, INCLUDE ESOPHAGUS, STOMACH, & DUODENUM &/OR JEJUNUM; DX W/WO COLLECTION SPECIMN, BY BRUSH OR WASH;  Surgeon: Andrey Farmer, MD;  Location: GI PROCEDURES [facility];  Service: Gastroenterology    TUMOR REMOVAL      multiple-head, neck, back, hand, right flank, multiple           Family History    Family History   Problem Relation Age of Onset    No Known Problems Mother     No Known Problems Father     No Known Problems Sister     No Known Problems Brother     Stroke Maternal Grandmother     Other Maternal Grandmother         benign lesions of liver and pancreas, further details unknown    Cancer Maternal Grandmother     Diabetes Maternal Grandmother     Hypertension Maternal Grandmother     Thyroid disease Maternal Grandmother     Arthritis Maternal Grandfather     Asthma Maternal Grandfather     COPD Paternal Grandmother         Deceased    Miscarriages / Stillbirths Paternal Grandmother     Alcohol abuse Paternal Grandfather         Deceased    No Known Problems Maternal Aunt     No Known Problems Maternal Uncle     No Known Problems Paternal Aunt     No Known Problems Paternal Uncle     Anesthesia problems Neg Hx     Broken bones Neg Hx     Cancer Neg Hx Clotting disorder Neg Hx     Collagen disease Neg Hx     Diabetes Neg Hx     Dislocations Neg Hx     Fibromyalgia Neg Hx     Gout Neg Hx     Hemophilia Neg Hx     Osteoporosis Neg Hx     Rheumatologic disease Neg Hx     Scoliosis Neg Hx     Severe sprains Neg Hx     Sickle cell anemia Neg Hx     Spinal Compression Fracture Neg Hx     Melanoma Neg Hx     Basal cell carcinoma Neg  Hx     Squamous cell carcinoma Neg Hx          Social History:  Social History     Tobacco Use   Smoking Status Never    Passive exposure: Past   Smokeless Tobacco Never     Social History     Substance and Sexual Activity   Alcohol Use Never     Social History     Substance and Sexual Activity   Drug Use Never         Review of Systems    A 12 system review of systems was negative except as noted in HPI    Pertinent Imaging/Labs:  reviewed    Lab Results   Component Value Date    CREATININE 0.48 (L) 02/18/2022       Lab Results   Component Value Date    ALKPHOS 39 (L) 02/12/2022    BILITOT 0.3 02/12/2022    BILIDIR 0.20 12/04/2021    PROT 6.4 02/12/2022    ALBUMIN 3.4 02/12/2022    ALT 8 (L) 02/12/2022    AST 15 02/12/2022       Encounter Date: 01/10/22   ECG 12 Lead   Result Value    EKG Systolic BP     EKG Diastolic BP     EKG Ventricular Rate 76    EKG Atrial Rate 76    EKG P-R Interval 140    EKG QRS Duration 82    EKG Q-T Interval 366    EKG QTC Calculation 411    EKG Calculated P Axis 54    EKG Calculated R Axis 44    EKG Calculated T Axis 57    QTC Fredericia 395    Narrative    NORMAL SINUS RHYTHM  NORMAL ECG  WHEN COMPARED WITH ECG OF 10-Jan-2022 04:16,  NO SIGNIFICANT CHANGE WAS FOUND  Confirmed by Cheryll Dessert ([id number]) on 01/13/2022 4:20:41 PM       No results found for requested labs within last 2 days.       Objective:     Vital Signs  Temp:  [36.6 ??C (97.9 ??F)-36.9 ??C (98.4 ??F)] 36.7 ??C (98.1 ??F)  Heart Rate:  [78-101] 85  Resp:  [16] 16  BP: (93-108)/(50-64) 98/60  MAP (mmHg):  [65-76] 73  SpO2:  [95 %-99 %] 99 %    Physical Exam    GENERAL:  Well developed, well-nourished female and is in no apparent distress.   HEAD/NECK:    Reveals normocephalic/atraumatic.   CARDIOVASCULAR:   Warm, well perfused  LUNGS:   Normal work of breathing, no supplemental 02  EXTREMITIES:  Warm, no clubbing, cyanosis, or edema was noted.  NEUROLOGIC:    The patient was alert and oriented times four with normal language, attention, cognition and memory. Cranial nerve exam was grossly normal.   ABDOMEN:  Tumor in left lower quadrant, tender throughout whole abdomen  SKIN:  No obvious rashes lesions or erythema  PSY:  Appropriate affect and mood.      Problem List    Principal Problem:    GI bleeding  Active Problems:    [name]    [name]    Pain    Iron deficiency anemia due to chronic blood loss    History of colectomy    This note was dictated using dragon. It has been proofread however it may be possible that there are minor typos, however this does not reflect the care given  to the patient.

## 2022-02-18 NOTE — Unmapped (Signed)
Oncology (MEDO) Progress Note    Assessment & Plan:   [name] [name] is a 21 y.o. 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     Principal Problem:    GI bleeding  Active Problems:    [name]    [name]    Pain    Iron deficiency anemia due to chronic blood loss    History of colectomy  Resolved Problems:    * No resolved hospital problems. *    Active Problems    Acute on chronic abdominal pain 2/2 desmoid tumors  Patient's baseline pain is LLQ and now presenting with RLQ pulling, tugging pain. CT abdomen pelvis with IV contrast done in the ED does show unchanged size and distribution of multiple mesenteric masses compatible with desmoid tumors. However there is also new R adnexal cyst noted. Patient is diffusely tender to palpation but specifies that RLQ is where pain is worse/different. Patient also reports that she is currently menstruating which may be contributing to the pain. Continues to have similar to slightly worsening abdominal pain. KUB demonstrates partial SBO vs ileus baded on gas pattern however patient continues to have bowel movements. Repeat CT A/P showed distal small bowel dilation and proximal collapse with no transition zone, ruling out acute obstructive process. Distal dilation may be due to constipation. Last BM was 12/10 PM.   12/12: Patient continues to have significant pain.   -- Inpatient chronic pain consulted for recommendations regarding weaning IV dilaudid  -- Miralax daily for constipation, consider PAMORA if not improved   -- considering fentanyl patch for pain  -- Continue tylenol 650 mg Q6h prn   - Continue oxycodone 10 mg every 4 hours PRN  - IV Dilaudid 1 mg every 3 hours as needed for breakthrough pain      Concern for GI Bleeding - Acute on Chronic anemia - Iron deficiency Anemia, resolved   Patient coming in after notable hemoglobin drop on outpatient labs to 7.6 from baseline of 11 with associated bright red blood per rectum over the last 5 days. Had recent hospitalization for similar presentation, bleeding at the time attributed to anal fissures. Underwent endoscopy 12/7 with GI which showed bleeding ulcer at ileoanal anastamosis. This may have been exacerbated because patient was on eliquis. On 12/8 AM, hgb was 6.9 requiring 1 unit of PRBC. Responded appropriately with repeat hgb of 9.2. Menstrual bleeding may have  also contributed to blood loss. Patient's hemoglobin has been steadily 8.5-8.9.   - Maintain active type and screen  - Discontinue eliquis per hematology recs  - Daily H&H    Headache, sinus osteomas  Patient reports retro-orbital headache for 3 days that started after blood transfusion. On 12/10, reported some blurry vision but denies any other neurological symptoms and no focal deficits on exam. CT head 12/10 showed osteomas in R sphenoid and bilateral ethmoid sinuses that may be contributing to pain. CT sinus without contrast show stable osteomas.   - pain well controlled with prn oxy, dilaudid she is receiving for abdominal pain  - ENT consulted    Severe Malnutrition 2/2 [name] and abdominal desmoid tumors   On previous admission she was discharged with a Corpak and was ultimately transitioned to TPN.  She is currently receiving TPN every day and  this is being followed by Dr. [name], outpatient GI. TPN resumed 12/7.  - Nutrition consulted for TPN recommendations     [name]/Desmoid Tumors   Follows with Dr. [name].  - Hold sorafenib daily as it can cause bleeding, fistulas  -- Will defer restarting to outpatient oncologist    Hx of PICC Line associated clot   She was treated for this back in January and given recent reinsertion of PICC line for TPN was started on a prophylactic dose of Eliquis.  - Hematology consulted, recommended discontinuing eliquis and continuing with symptom directed management      Chronic Problems  Anxiety   - Continue Zyprexa 7.5 mg nightly  - Continue Remeron 7.5 nightly  - Continue gabapentin 200 mg nightly     Hx of Pouchitis   Patient has been on cefdinir for this and was recently decreased the dose by her GI doctor for prophylactic dosing. On last scope in November, noted to not have active pouchitis.  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: recurring with re-initiation of sorafenib  -Continue clobetasol 0.05%    Issues Impacting Complexity of Management:      Medical Decision Making: Discussed the patient's management and/or test interpretation with Gastroenterology as summarized within this note      Daily Checklist:  Diet: Regular Diet  DVT PPx: Contraindicated - High Risk for Bleeding/Active Bleeding  Electrolytes: Replete Potassium to >/= 3.6 and Magnesium to >/= 1.8  Code Status: Full Code  Dispo: Home    Team Contact Information:   Primary Team: Oncology (MEDO)  Primary Resident: Nyoka Lint, MD  Resident's Pager: [phone] (Oncology Intern - Tower)    Interval History:   No acute overnight events. Patient continues to have severe lower abdominal pain. Reports one BM yesterday that was loose and brown.     All other systems were reviewed and are negative except as noted in the HPI    Objective:   Temp:  [36.6 ??C (97.9 ??F)-36.9 ??C (98.4 ??F)] 36.7 ??C (98.1 ??F)  Heart Rate:  [78-101] 85  Resp:  [16] 16  BP: (93-108)/(50-64) 98/60  SpO2:  [95 %-99 %] 99 %    Gen: NAD, converses   HENT: atraumatic, normocephalic  Heart: RRR  Lungs: CTAB, no crackles or wheezes  Abdomen: diffuse abdominal tenderness with significant pain on palpation in bilateral lower quadrants R>L  Extremities: No edema

## 2022-02-18 NOTE — Unmapped (Signed)
Otolaryngology Treatment Plan Note     Assessment/Recommendations:  This is a 21 y.o. 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 Given equal bilateral symptoms, light sensitivity, sudden onset after blood transfusion on 12/8 (with history of similar symptoms with prior iron infusions), some improvement over the past day, her symptoms are likely transfusion related rather than to the osteomas.     1. CT sinus reviewed with bl ethmoid osteoma (R>L), small ppf osteoma  2.  Recommend BID nasal saline sprays, consider Flonase twice daily.  3. We will coordinate outpatient follow up at discharge

## 2022-02-18 NOTE — Unmapped (Signed)
Therapist Contact Note: Went by [name] [name]'s room for a session, but she was busy at that moment, so I will come back around later today to check on her!

## 2022-02-18 NOTE — Telephone Encounter (Signed)
..  Type of form received: POC  Additional comments:   Received TE:IHDT care home health   Form should be Faxed to: [phone]  Form should be mailed to:  na   Is patient requesting call for pickup: Na   Form placed:  Burnice Logan folder   Attach charge sheet.  Yes  Individual made aware of 3-5 business day turn around (Y/N)?  No

## 2022-02-19 DIAGNOSIS — R112 Nausea with vomiting, unspecified: Secondary | ICD-10-CM | POA: Diagnosis not present

## 2022-02-19 DIAGNOSIS — Z932 Ileostomy status: Secondary | ICD-10-CM | POA: Diagnosis not present

## 2022-02-19 DIAGNOSIS — E860 Dehydration: Secondary | ICD-10-CM | POA: Diagnosis not present

## 2022-02-19 DIAGNOSIS — R1084 Generalized abdominal pain: Secondary | ICD-10-CM | POA: Diagnosis not present

## 2022-02-19 LAB — CBC
HEMATOCRIT: 27.8 % — ABNORMAL LOW (ref 34.0–44.0)
HEMOGLOBIN: 9.2 g/dL — ABNORMAL LOW (ref 11.3–14.9)
MEAN CORPUSCULAR HEMOGLOBIN CONC: 33 g/dL (ref 32.0–36.0)
MEAN CORPUSCULAR HEMOGLOBIN: 28.2 pg (ref 25.9–32.4)
MEAN CORPUSCULAR VOLUME: 85.5 fL (ref 77.6–95.7)
MEAN PLATELET VOLUME: 9.4 fL (ref 6.8–10.7)
PLATELET COUNT: 254 10*9/L (ref 150–450)
RED BLOOD CELL COUNT: 3.25 10*12/L — ABNORMAL LOW (ref 3.95–5.13)
RED CELL DISTRIBUTION WIDTH: 15.4 % — ABNORMAL HIGH (ref 12.2–15.2)
WBC ADJUSTED: 5.8 10*9/L (ref 3.6–11.2)

## 2022-02-19 LAB — BASIC METABOLIC PANEL
ANION GAP: 9 mmol/L (ref 5–14)
BLOOD UREA NITROGEN: 14 mg/dL (ref 9–23)
BUN / CREAT RATIO: 29
CALCIUM: 8.9 mg/dL (ref 8.7–10.4)
CHLORIDE: 108 mmol/L — ABNORMAL HIGH (ref 98–107)
CO2: 26 mmol/L (ref 20.0–31.0)
CREATININE: 0.49 mg/dL — ABNORMAL LOW
EGFR CKD-EPI (2021) FEMALE: >90 mL/min/{1.73_m2} (ref >=60)
GLUCOSE RANDOM: 89 mg/dL (ref 70–179)
POTASSIUM: 3.9 mmol/L (ref 3.4–4.8)
SODIUM: 143 mmol/L (ref 135–145)

## 2022-02-19 LAB — PHOSPHORUS: PHOSPHORUS: 5.2 mg/dL — ABNORMAL HIGH (ref 2.4–5.1)

## 2022-02-19 LAB — MAGNESIUM: MAGNESIUM: 1.9 mg/dL (ref 1.6–2.6)

## 2022-02-19 MED ORDER — OXYCODONE 5 MG TABLET
ORAL_TABLET | ORAL | 0 refills | 7 days | Status: CP | PRN
Start: 2022-02-19 — End: 2022-02-26
  Filled 2022-02-19: qty 42, 7d supply, fill #0

## 2022-02-19 MED ADMIN — cefdinir (OMNICEF) capsule 300 mg: 300 mg | ORAL | @ 15:00:00 | Stop: 2022-02-19

## 2022-02-19 MED ADMIN — mirtazapine (REMERON) tablet 7.5 mg: 7.5 mg | ORAL | @ 04:00:00

## 2022-02-19 MED ADMIN — oxyCODONE (ROXICODONE) immediate release tablet 15 mg: 15 mg | ORAL | @ 02:00:00 | Stop: 2022-03-04

## 2022-02-19 MED ADMIN — oxyCODONE (ROXICODONE) immediate release tablet 15 mg: 15 mg | ORAL | @ 15:00:00 | Stop: 2022-02-19

## 2022-02-19 MED ADMIN — oxyCODONE (ROXICODONE) immediate release tablet 15 mg: 15 mg | ORAL | @ 11:00:00 | Stop: 2022-02-19

## 2022-02-19 MED ADMIN — ammonium lactate (LAC-HYDRIN) 12 % lotion 1 Application: 1 | TOPICAL | @ 15:00:00 | Stop: 2022-02-19

## 2022-02-19 MED ADMIN — polyethylene glycol (MIRALAX) packet 17 g: 17 g | ORAL | @ 15:00:00 | Stop: 2022-02-19

## 2022-02-19 MED ADMIN — clobetasoL (TEMOVATE) 0.05 % ointment: TOPICAL | @ 15:00:00 | Stop: 2022-02-19

## 2022-02-19 MED ADMIN — OLANZapine (ZyPREXA) tablet 7.5 mg: 7.5 mg | ORAL | @ 04:00:00

## 2022-02-19 MED ADMIN — Parenteral Nutrition (CENTRAL): INTRAVENOUS | @ 06:00:00 | Stop: 2022-02-19

## 2022-02-19 MED ADMIN — gabapentin (NEURONTIN) capsule 200 mg: 200 mg | ORAL | @ 04:00:00

## 2022-02-19 MED ADMIN — oxyCODONE (ROXICODONE) immediate release tablet 10 mg: 10 mg | ORAL | @ 19:00:00 | Stop: 2022-02-19

## 2022-02-19 MED ADMIN — acetaminophen (TYLENOL) tablet 1,000 mg: 1000 mg | ORAL | @ 11:00:00 | Stop: 2022-02-19

## 2022-02-19 MED ADMIN — fat emulsion 20 % with fish oil (SMOFlipid) infusion 250 mL: 250 mL | INTRAVENOUS | @ 06:00:00 | Stop: 2022-02-19

## 2022-02-19 MED ADMIN — acetaminophen (TYLENOL) tablet 1,000 mg: 1000 mg | ORAL | @ 17:00:00 | Stop: 2022-02-19

## 2022-02-19 NOTE — Unmapped (Signed)
Department of Anesthesiology  Pain Medicine Division    Chronic Pain Followup Inpatient Consult Note    Requesting Attending Physician:  Jeannine Boga, MD  Service Requesting Consult:  Oncology/Hematology (MDE)    Assessment/Recommendations:  The patient was seen in consultation on request of Jeannine Boga, MD regarding assistance with pain management. The patient is obtaining adequate pain relief on current medication regimen.      The patient is a 21 y.o. 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 Patient has since been in the hospital for pain management.      Chronic pain was consulted for management of patient's pain as patient does follow at the [facility] pain clinic and has acute on chronic pain. Patient states she was doing well outside of the hospital but started to have some weakness and fatigue a day or two prior to coming to the hospital. She was found to have a gI bleed and since then, she has been having some right sided abdominal pain. Patient states it feels slightly different than before however she is currently on her period as well which may be causing her some discomfort.     Upon discussion with GI team outside of patient's room, it was mentioned that this acuity and severity of patient's pain is outside the scope of her current admission diagnosis.  GI team stressed the importance of transitioning from IV pain medications to oral medications as patient is able to tolerate by mouth.  When discussing this with the patient, patient was very frustrated and triggered.  Patient stated that she is having desmoid related pain and the GI team does not understand.  We discussed that instead of removing her IV medications completely, we can convert her IV dosage into a p.o. regimen and allow her to receive the same amount of opioids as she was previously.  Patient and mother agreed to this plan.  Upon reviewing possible options for p.o. transition, patient stated she would be okay with taking p.o. oxycodone instead of p.o. Dilaudid as she does not like the way that that makes her feel.  We discussed that we can continue her oxycodone 10 mg every 4 hours with the as needed or we can allow her to receive 10 to 15 mg every 4 hours as needed.  Patient and mother elected to choose the second plan.  We discussed that if choosing this option, the patient would not have any as needed medication and she expressed understanding to this plan.    Interval: Upon entry into room, patient was resting comfortably in sleeping.  Patient states that she was able to finally relax and sleep overnight whereas yesterday throughout the day, she remained in a very painful/ stressed state.  Patient's mother stated that as patient received multiple medications at night, she was very comfortable.  We discussed that she can continue to use her as needed oxycodone as needed however if she is finding that she is having adequate pain control, we recommend her to use oxycodone 10 mg to avoid excessive sedation or other side effects.  Patient stated that she only requests a 15 when she is having severe pain which was most of the day yesterday.  We also discussed that our pain psychologist from our clinic may be in the hospital today so we would have her come by to speak with the patient.  Patient and  mother are in agreement to this plan.  Patient's mother requested that if patient was going to be discharged today, she would prefer to be discharged on oxycodone 5 mg tablets so that she can take the minimum amount of medications as needed however has option to take more.  She stated she would not prefer to be discharged on 15 mg tablets as that may be difficult to administer.  She also stated she may have some oxycodone at home from prior prescription fill so she will try to have her husband perform a pill count and let the primary team know.  We discussed the plan of the primary team and advised them to see if patient could have a sooner follow-up in the pain clinic to avoid having to prescribe long term medications from their standpoint.  Primary team was in agreement with this plan.    Recommendations:  -The chronic pain service is a consult service and does not place orders, just makes recommendations (except ketamine and lidocaine infusions)   -Please evaluate all patients on opioids for appropriateness of prescribing narcan at discharge.  The chronic pain service can assist with this.  Nasal narcan is covered by most insurances.  -Recommendations given apply to the current hospitalization and do not reflect long term recommendations.  - Continue Tylenol 1000 mg q8h  - Encourage use of Baclofen 5-10 mg BID PRN  - Continue Gabapentin 200 mg at bedtime  - Continue PO oxycodone 10-15 mg q4h PRN, discussed that as she and her body are able to relax more from obtaining adequate pain relief, I would encourage her to take the oxycodone 10 mg if her pain is decently controlled. Continue to wean as tolerated to get close to home regimen.  - Discussed that if we choose this route, patient would NOT have another opioid breakthru option for pain besides the oxycodone and she agreed to this plan.  - Pain psychologist to come by today and meet with patient if available      We will sign off at this time.  Please contact the chronic pain service if we can be of further assistance with pain control.    Medications trialed during this admission (or pertinent in past):   Tylenol 1000 mg q8h  Baclofen 5-10 mg BID PRN  Gabapentin 200 mg at bedtime  HM IV 0.5-1 mg q2h PRN  Oxycodone 10 mg-15 q4h PRN     Naloxone Rx at discharge?  Is patient on opioids? Yes.  1)Is dose >50MME?  Yes.  2) Is patient prescribed a benzodiazepine (w opioids)? No.  3)Hx of overdose?  No.  4) Hx of substance use disorder? No.  5) Opioids likely to last greater than a week after discharge? Yes.    If yes to 2 or more, prescribe naloxone at discharge.  Nasal narcan for most insured (Nasal narcan 4mg /actuation, prescribe 1 kit, instructions at [email or web address]).  For uninsured, chronic pain can work to assist in finding an option.  OTC nasal narcan now available at most pharmacies for around $45.    Interim History  There were no Acute Events Overnight.  The patient is obtaining adequate pain relief on current medication regimen and feels that their pain is Controlled    Today patient reports sleeping well overnight with her current regimen.     Analgesia Evaluation:  Pain at minimum: 0/10  Pain at maximum: 9/10    Current pain medication regimen (including how frequent PRN's were used):  Tylenol 1 mg  q8h  Gabapentin 200 mg at bedtime  Oxycodone 10-15 mg q4h pRN (x3)  Baclofen 5-10 mg PRN       Inpatient Medications  Current Facility-Administered Medications   Medication Dose Route Frequency Provider Last Rate Last Admin    acetaminophen (TYLENOL) tablet 1,000 mg  1,000 mg Oral TID [name], MD   1,000 mg at 02/19/22 1610    ammonium lactate (LAC-HYDRIN) 12 % lotion 1 Application  1 Application Topical BID Mack Guise, MD   1 Application at 02/18/22 0949    baclofen (LIORESAL) tablet 5-10 mg  5-10 mg Oral BID PRN Madishetty, Micki Riley, MD        cefdinir (OMNICEF) capsule 300 mg  300 mg Oral Daily Madishetty, Sanjana, MD   300 mg at 02/19/22 0947    clobetasoL (TEMOVATE) 0.05 % ointment   Topical BID Nyoka Lint, MD   Given at 02/18/22 0949    emollient combination no.92 (LUBRIDERM) lotion   Topical Continuous PRN Madishetty, Micki Riley, MD        Parenteral Nutrition (CENTRAL)   Intravenous Continuous Jeannine Boga, MD 100 mL/hr at 02/19/22 0056 New Bag at 02/19/22 0056    And    fat emulsion 20 % with fish oil (SMOFlipid) infusion 250 mL  250 mL Intravenous Continuous Jeannine Boga, MD 20.8 mL/hr at 02/19/22 0056 250 mL at 02/19/22 0056    [START ON 02/20/2022] Parenteral Nutrition (CENTRAL)   Intravenous Continuous Madishetty, Micki Riley, MD        And    [START ON 02/20/2022] fat emulsion 20 % with fish oil (SMOFlipid) infusion 250 mL  250 mL Intravenous Continuous Madishetty, Micki Riley, MD        gabapentin (NEURONTIN) capsule 200 mg  200 mg Oral Nightly Madishetty, Sanjana, MD   200 mg at 02/18/22 2232    mirtazapine (REMERON) tablet 7.5 mg  7.5 mg Oral Nightly Madishetty, Sanjana, MD   7.5 mg at 02/18/22 2232    OLANZapine (ZyPREXA) tablet 7.5 mg  7.5 mg Oral Nightly Madishetty, Sanjana, MD   7.5 mg at 02/18/22 2232    ondansetron (ZOFRAN-ODT) disintegrating tablet 4 mg  4 mg Oral Q8H PRN Madishetty, Sanjana, MD   4 mg at 02/18/22 0330    Or    ondansetron (ZOFRAN) injection 4 mg  4 mg Intravenous Q8H PRN Madishetty, Sanjana, MD   4 mg at 02/18/22 0948    oxyCODONE (ROXICODONE) immediate release tablet 10 mg  10 mg Oral Q4H PRN Madishetty, Micki Riley, MD        Or    oxyCODONE (ROXICODONE) immediate release tablet 15 mg  15 mg Oral Q4H PRN Madishetty, Sanjana, MD   15 mg at 02/19/22 0619    polyethylene glycol (MIRALAX) packet 17 g  17 g Oral Daily [name], MD   17 g at 02/19/22 0952    sodium chloride (OCEAN) 0.65 % nasal spray 1 spray  1 spray Each Nare QID PRN Madishetty, Micki Riley, MD   1 spray at 02/18/22 1246         Objective:     Vital Signs    Temp:  [36.4 ??C (97.5 ??F)-36.8 ??C (98.2 ??F)] 36.6 ??C (97.9 ??F)  Heart Rate:  [75-89] 89  Resp:  [16] 16  BP: (95-119)/(54-67) 111/61  MAP (mmHg):  [66-75] 75  SpO2:  [97 %-99 %] 97 %      Physical Exam    GENERAL:  Well developed, well-nourished female and is in no apparent distress.  HEAD/NECK:    Reveals normocephalic/atraumatic.   CARDIOVASCULAR:   Warm, well perfused  LUNGS:   Normal work of breathing, no supplemental 02  EXTREMITIES:  Warm, no clubbing, cyanosis, or edema was noted.  NEUROLOGIC:    Patient in deep sleep. Once awoken, patient was alert and oriented times four with normal language, attention, cognition and memory. Cranial nerve exam was grossly normal.   ABDOMEN:  Tumor in left lower quadrant, tender throughout whole abdomen  SKIN:  No obvious rashes lesions or erythema  PSY:  Appropriate affect and mood.    Test Results    Lab Results   Component Value Date    CREATININE 0.48 (L) 02/18/2022     Lab Results   Component Value Date    ALKPHOS 39 (L) 02/12/2022    BILITOT 0.3 02/12/2022    BILIDIR 0.20 12/04/2021    PROT 6.4 02/12/2022    ALBUMIN 3.4 02/12/2022    ALT 8 (L) 02/12/2022    AST 15 02/12/2022           Problem List    Principal Problem:    GI bleeding  Active Problems:    [name]    [name]    Pain    Iron deficiency anemia due to chronic blood loss    History of colectomy      This note was dictated using dragon. It has been proofread however it may be possible that there are minor typos, however this does not reflect the care given to the patient.      Phylliss Bob, DO  PGY-5 Pain Management Fellow

## 2022-02-19 NOTE — Unmapped (Signed)
Physician Discharge Summary [facility]  4 ONC [facility]  [address]  [place] [address]  Dept: [phone]  Loc: [phone]     Identifying Information:   [name] [name] [facility]  February 10, 2001  [id number]    Primary Care Physician: Jarold Motto, [facility]     Referring Physician: Referred Self     Code Status: Full Code    Admit Date: 02/12/2022    Discharge Date: 02/19/2022     Discharge To: Home    Discharge Service: [facility] - Oncology Floor Team (MED Orlin Hilding)     Discharge Attending Physician: JIA, Boykin Nearing     Discharge Diagnoses:  Principal Problem:    GI bleeding  Active Problems:    [name]    [name]    Pain    Iron deficiency anemia due to chronic blood loss    History of colectomy  Resolved Problems:    * No resolved hospital problems. *      Outpatient Provider Follow Up Issues:   Follow-up Plan after discharge:  Issues related to hospitalization: pain management  Follow-up appointment with [facility]: 12/26 with Dr. [name]  Oncology specific plans going forward: Will discuss alternatives to sorafenib with Dr. [name]     Patient's primary oncologist and/or nurse navigator: Dr. Clayton Bibles handoff via Epic message or direct conversation?: Yes. Attending discussion with Dr. Nyra Jabs Course:   [name] [name] is a 21 y.o. 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     GI Bleed, acute on chronic anemia   Patient presented with bright red blood per rectum and notable hemoglobin drop in outpatient labs from 11 to 7.6. She had recent hospitalization for similar presentation where bleeding was attributed to anal fissures. She underwent pouchoscopy with GI on 12/7 which showed bleeding ulcer at ileoanal anastamosis. The GI team used APC to coagulate the area. Patient was restarted on eliquis following the previous hospitalization which may have contributed to this bleed. She received 1 unit of PRBC and hgb recovered appropriately. Eliquis was discontinued (more information below) on discharge, and Hgb was 9.2 on discharge.     Acute on chronic abdominal pain 2/2 desmoid tumors  Patient presented with acute on chronic lower abdominal pain. Patient has baseline LLQ abdominal pain from her desmoid tumors. Patient reports that she takes oxycodone 10 mg every 4 hours as needed for her baseline pain. She has been doing well and able to go up to 12 hours without oxycodone prior to this hospitalization. Her pain on admission was primarily in RLQ. CT abdomen pelvis with IV contrast done in the ED does show unchanged size and distribution of multiple mesenteric masses compatible with desmoid tumors. After GI procedure, patient had significant increase in abdominal pain. Pain was initially thought to be post procedural. However, patient continued to have severe pain requiring IV dilaudid every 3 hours days after GI procedure. Repeat CT A/P showed distal small bowel dilation and proximal collapse, ruling out obstructive process. There were no other significant changes. Inpatient chronic pain was consulted for recommendations regarding weaning dilaudid. They have worked with this patient many times in the past inpatient and outpatient. They recommended increasing oxycodone PRN dose to 15 mg and discontinuing dilaudid. Patient tolerated this change well,  and was discharged home with 7 days of oxycodone 15 mg to cover breakthrough pain. She has follow up with pain management on 02/26/22.      Headache, sinus osteomas  Patient had retro-orbital headache for 3 days that started after blood transfusion on 12/7. On 12/10, reported some blurry vision but denies any other neurological symptoms and no focal deficits on exam. CT head 12/10 showed osteomas in R sphenoid and bilateral ethmoid sinuses that may be contributing to pain. CT sinus without contrast show stable osteomas. Patient was evaluated by ENT who will follow up with the patient outpatient. Pain is most likely not related to osteomas per ENT.      Severe Malnutrition 2/2 [name] and abdominal desmoid tumors   On previous admission she was discharged with a Corpak and was ultimately transitioned to TPN.  She is currently receiving TPN every day and this is being followed by Dr. [name], outpatient GI. TPN resumed 12/7 and patient discharged with new TPN orders.      [name]/Desmoid Tumors   Patient has history of Gardner's syndrome/[name] with desmoid tumors. She follows with Dr. [name]. She was on sorafenib which was held and discontinued on discharge as it can cause bleeding and fistulas. Patient has follow up with Dr. [name] on 03/04/22 to discuss future management.      Hx of PICC Line associated clot   She was treated for PICC line (for TPN) associated clot back in January. Patient was started on eliquis prophylactically as she is on TPN. Hematology was consulted regarding need for eliquis in the setting of recurrent GI bleed and recommended discontinuing eliquis. They recommend symptom directed management.     Anxiety   Patient continued on zyprexa 7.5 mg nightly, remeron 7.5 mg nightly, and gabapentin 200 mg nightly during hospitalization.     Hx of Pouchitis   Patient has been on cefdinir for pouchitis. Dose was recently decreased by her GI doctor for prophylactic dosing. On last scope in November, noted to not have active pouchitis. Continued on cefdinir 300 mg daily during hospitalization.      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 Continued clobetasol 0.05% as needed during hospitalization.       Procedures:  Pouchoscopy with GI  No admission procedures for hospital encounter.  ______________________________________________________________________  Discharge Medications:     Your Medication List        STOP taking these medications      apixaban 2.5 mg Tab  Commonly known as: ELIQUIS     hydrocortisone 2.5 % cream     SORAfenib 200 mg tablet  Commonly known as: NexAVAR            CHANGE how you take these medications      acetaminophen 500 MG tablet  Commonly known as: TYLENOL  Take 2 tablets (1,000 mg total) by mouth every eight (8) hours.  What changed:   when to take this  reasons to take this     oxyCODONE 10 mg immediate release tablet  Commonly known as: ROXICODONE  Take 1 tablet (10 mg total) by mouth every four (4) hours as needed for pain. Fill on or after: 03/01/22  What changed: Another medication with the same name was added. Make sure you understand how and when to take each.     oxyCODONE 5 MG immediate release tablet  Commonly known as: ROXICODONE  Take 1 tablet (5 mg total) by mouth every four (4)  hours as needed for pain for up to 7 days. Take in combination with 10mg  tablet for a total dose 15mg  if needed for severe pain.  What changed: You were already taking a medication with the same name, and this prescription was added. Make sure you understand how and when to take each.            CONTINUE taking these medications      ammonium lactate 12 % lotion  Commonly known as: LAC-HYDRIN  Apply 1 Application topically two (2) times a day.     baclofen 5 mg Tab tablet  Commonly known as: LIORESAL  Take 1-2 tablets (5-10 mg total) by mouth two (2) times a day as needed for muscle spasms.     cefdinir 300 MG capsule  Commonly known as: OMNICEF  Take 1 capsule (300 mg total) by mouth daily.     clobetasol 0.05 % ointment  Commonly known as: TEMOVATE  Apply topically two (2) times a day. To stubborn/thick rashes on hands/feet ears until clear/smooth. Restart as needed     diclofenac sodium 1 % gel  Commonly known as: VOLTAREN  Apply 2 g topically four (4) times a day.     diphenhydrAMINE 50 mg capsule  Commonly known as: BENADRYL  Take 1 capsule (50 mg total) by mouth every six (6) hours as needed for itching (per patient preference (if prefers over hydroxyzine)).     famotidine 20 MG tablet  Commonly known as: PEPCID  Take 1 tablet (20 mg total) by mouth nightly.     gabapentin 100 MG capsule  Commonly known as: NEURONTIN  Take 2 capsules (200 mg total) by mouth nightly.     hydrocortisone 25 mg suppository  Commonly known as: ANUSOL-HC  Insert 1 suppository (25 mg total) into the rectum two (2) times a day as needed for hemorrhoids (possible rectal/anal bleeding source).     hyoscyamine 0.125 mg tablet  Commonly known as: LEVSIN  Take 1 tablet (0.125 mg total) by mouth every four (4) hours as needed for cramping.     lidocaine 5 % patch  Commonly known as: LIDODERM  Place 1 patch on the skin daily. Apply to affected area for 12 hours only each day (then remove patch)     menthol-zinc oxide 0.44-20.6 % Oint  Commonly known as: CALMOSEPTINE  Apply topically four (4) times a day as needed.     mirtazapine 7.5 MG tablet  Commonly known as: REMERON  Take 1 tablet (7.5 mg total) by mouth nightly.     naloxone 4 mg/actuation nasal spray  Commonly known as: NARCAN  One spray in either nostril once for known/suspected opioid overdose. May repeat every 2-3 minutes in alternating nostril til EMS arrives     NIFEdipine 0.3% lidocaine 1.5% in petrolatum ointment  Apply topically two (2) times a day.     OLANZapine 7.5 MG tablet  Commonly known as: ZyPREXA  Take 1 tablet (7.5 mg total) by mouth nightly.     ondansetron 8 MG disintegrating tablet  Commonly known as: ZOFRAN-ODT  Take 1 tablet (8 mg total) by mouth every twelve (12) hours as needed for nausea.     pimecrolimus 1 % cream  Commonly known as: ELIDEL  Apply topically two (2) times a day. To face as needed for rash     silver sulfADIAZINE 1 % cream  Commonly known as: SILVADENE  Apply topically daily. To burn on leg     SLEEPY BUTTER  Swish and spit 10 mL 4 times a day as needed.     TPN ELECTROLYTES IV  Receiving IV TPN     triamcinolone 0.1 % cream  Commonly known as: KENALOG  Apply topically two (2) times a day.     zinc oxide 10 % Crea  Apply 1 application. topically daily as needed.            ASK your doctor about these medications      buprenorphine 10 mcg/hour Ptwk transdermal patch  Place 1 patch on the skin every seven (7) days.              Allergies:  Adhesive tape-silicones; Ferrlecit [sodium ferric gluconat-sucrose]; Levofloxacin; Methylnaltrexone; Neomycin; Papaya; Morphine; Zosyn [piperacillin-tazobactam]; Compazine [prochlorperazine]; and Latex, natural rubber  ______________________________________________________________________  Pending Test Results (if blank, then none):      Most Recent Labs:  All lab results last 24 hours -   Recent Results (from the past 24 hour(s))   CBC    Collection Time: 02/19/22  1:01 AM   Result Value Ref Range    WBC 5.8 3.6 - 11.2 10*9/L    RBC 3.25 (L) 3.95 - 5.13 10*12/L    HGB 9.2 (L) 11.3 - 14.9 g/dL    HCT 16.1 (L) 09.6 - 44.0 %    MCV 85.5 77.6 - 95.7 fL    MCH 28.2 25.9 - 32.4 pg    MCHC 33.0 32.0 - 36.0 g/dL    RDW 04.5 (H) 40.9 - 15.2 %    MPV 9.4 6.8 - 10.7 fL    Platelet 254 150 - 450 10*9/L   Magnesium Level    Collection Time: 02/19/22  1:01 AM   Result Value Ref Range    Magnesium 1.9 1.6 - 2.6 mg/dL   Phosphorus Level    Collection Time: 02/19/22  1:01 AM   Result Value Ref Range    Phosphorus 5.2 (H) 2.4 - 5.1 mg/dL   Basic Metabolic Panel    Collection Time: 02/19/22  1:01 AM   Result Value Ref Range    Sodium 143 135 - 145 mmol/L    Potassium 3.9 3.4 - 4.8 mmol/L    Chloride 108 (H) 98 - 107 mmol/L    CO2 26.0 20.0 - 31.0 mmol/L    Anion Gap 9 5 - 14 mmol/L    BUN 14 9 - 23 mg/dL    Creatinine 8.11 (L) 0.55 - 1.02 mg/dL    BUN/Creatinine Ratio 29     eGFR CKD-EPI (2021) Female >90 >=60 mL/min/1.40m2    Glucose 89 70 - 179 mg/dL    Calcium 8.9 8.7 - 91.4 mg/dL Relevant Studies/Radiology (if blank, then none):  CT Sinus Wo Contrast    Result Date: 02/17/2022  EXAM: Computed tomography, sinus without contrast material. DATE: 02/17/2022 4:24 PM ACCESSION: [id number] UN DICTATED: 02/17/2022 4:33 PM INTERPRETATION LOCATION: [facility] CLINICAL INDICATION: 21 years old Female with sinus osteoma  COMPARISON: None TECHNIQUE: Axial CT images through the paranasal sinuses without contrast. Coronal and sagittal reformatted images are provided. FINDINGS:  Frontal sinuses are clear. Frontal recesses are patent. Ethmoid air cells are clear. Sphenoid sinuses are clear. Sphenoethmoidal recesses are patent. Maxillary sinuses are clear. Ostiomeatal units are patent. Right ethmoid conglomerate osteomas measuring 5 mm x 17 mm. Smaller subcentimeter osteoma is noted in the right posterior and left anterior ethmoid sinuses. Nasal septum is shifted to the right. No significant nasal spur. No skull base dehiscence. Small osteoma along  the posterior wall of the right pterygopalantine fossa as it exits laterally.     Multiple osteomas as above.     CT Abdomen Pelvis W Contrast    Result Date: 02/16/2022  EXAM: CT ABDOMEN PELVIS W CONTRAST ACCESSION: [id number] UN CLINICAL INDICATION: 21 years old with Worsening abdominal pain with KUB concerning for ileus for partial SBO. Last imaging demonstrated tethering of powel in the pelvis.  COMPARISON: 02/12/2022 abdominal pelvis CT and priors TECHNIQUE: A helical CT scan of the abdomen and pelvis was obtained following IV contrast from the lung bases through the pubic symphysis. Images were reconstructed in the axial plane. Coronal and sagittal reformatted images were also provided for further evaluation. FINDINGS: LOWER CHEST: Unremarkable. LIVER: Normal liver contour. Stable subcentimeter hypoenhancing lesions, too small to characterize on CT. No focal liver lesions. BILIARY: Bladder is partially contracted. No biliary ductal dilatation. SPLEEN: Normal in size and contour. PANCREAS: Normal pancreatic contour.  No focal lesions.  No ductal dilation. ADRENAL GLANDS: Normal appearance of the adrenal glands. KIDNEYS/URETERS: Symmetric renal enhancement.  No hydronephrosis.  No solid renal mass. Stable 1 cm simple renal cyst in the left interpolar region. BLADDER: Unremarkable. REPRODUCTIVE ORGANS: Unremarkable. GI TRACT: Status post total colectomy with ileoanal J-pouch formation. Moderate gas distention of distal small bowel loops reaching up to 4.3 cm in diameter which are tethered around the ill-defined and infiltrative mesenteric lesion in the right lower quadrant/inferior mid abdomen measuring 4.7 x 3.4 cm. There is no definite transition zone. Proximal small bowel loops are collapsed. The appendix is surgically absent. PERITONEUM, RETROPERITONEUM AND MESENTERY: No free air.  No ascites.  No fluid collection. Multiple infiltrative and ill-defined mesenteric lesions exchanged prior: - Left upper quadrant (2:63) measuring 1.6 x 1.1, previously measured 1.5 x 1.3 cm. - Left lower quadrant (2:74) measuring 4.1 x 3.8 cm, previously measured 4.3 x 3.8 cm -Right lower quadrant/inferior mid abdomen (2:86) measuring 4.7 x 3.4 cm, previously measured 4.7 x 3.4 cm LYMPH NODES: Interval increase in some mesenteric lymph nodes, now measuring up to 1 cm (2:93) VESSELS: Hepatic and portal veins are patent.  Normal caliber aorta.  BONES and SOFT TISSUES: No aggressive osseous lesions.  No focal soft tissue lesions. Similar appearance of nodular enhancement within the right rectus musculature (2:65)     Gas distention of distal small bowel loops adjacent to the ill-defined infiltrative mesenteric lesion in the right lower quadrant inferior mid abdomen, with no definite transition zone. Proximal small bowel loops are collapsed. Similar-appearing of the mesenteric lesions.     CT Head Wo Contrast    Result Date: 02/16/2022  EXAM: Computed tomography, head or brain without contrast material. DATE: 02/16/2022 11:34 AM ACCESSION: [id number] UN DICTATED: 02/16/2022 4:05 PM INTERPRETATION LOCATION: [facility] CLINICAL INDICATION: 21 years old Female with New character of headache in patient with desmoid tumor  COMPARISON: None TECHNIQUE: Axial CT images of the head  from skull base to vertex without contrast. FINDINGS: Normal gray and white matter differentiation. No findings to suggest acute infarction or intracranial hemorrhage. No significant cerebral edema, mass effect or midline shift. Normal appearance of bilateral cerebral sulci, ventricles and basal cisterns. Posterior fossa structures are grossly unremarkable. Normal appearance of bilateral orbital structures. Osteomas in the right sphenoid and bilateral ethmoid air cells. Visualized paranasal sinuses are otherwise clear. Mastoid air cells are unremarkable. No acute bony abnormality in the imaged skeleton.     No acute intracranial abnormality. Osteomas in right sphenoid and bilateral ethmoid sinuses suggesting [name]  syndrome in this patient with history of desmoid tumor.     XR Abdomen 1 View    Result Date: 02/16/2022  EXAM: XR ABDOMEN 1 VIEW ACCESSION: [id number] UN CLINICAL INDICATION: 21 years old with ABDOMINAL PAIN  -  UNSPECIFIED SITE  COMPARISON: CT 02/12/2022 TECHNIQUE: 2 supine views of the abdomen and pelvis FINDINGS: Gas seen in mildly dilated small bowel measuring up to 4 cm. Sequelae of prior total colectomy with ileoanal J-pouch formation. No abnormal calcifications. Osseous structures unremarkable. Lung bases clear.     Mildly dilated gas-filled small bowel in the pelvis measuring up to 4 cm with gas extending to the anus. Findings may be seen in the setting of partial small bowel obstruction versus ileus.    Pouch Exam    Result Date: 02/13/2022  _______________________________________________________________________________ Patient Name: [name] [name]         Procedure Date: 02/13/2022 2:53 PM MRN: [id number]                     Date of Birth: September 19, 2000 Admit Type: Inpatient                 Age: 72 Room: [facility] OR 01 [facility]          Gender: Female Note Status: Finalized                Instrument Name: [id number] _______________________________________________________________________________  Procedure:             Pouchoscopy Indications:           Hematochezia Providers:             Bronson Curb, MD, Garnette Czech                        (Fellow), LORI CHRISTENSEN, Dellia Nims, Technician Referring MD:          Jeannine Boga, MD (Referring MD) Medicines:             Propofol per Anesthesia Complications:         No immediate complications. _______________________________________________________________________________ Procedure:             Pre-Anesthesia Assessment:                        - Prior to the procedure, a History and Physical was                        performed, and patient medications and allergies were                        reviewed. The patient's tolerance of previous                        anesthesia was also reviewed. The risks and benefits                        of the procedure and the sedation options and risks                        were discussed with the patient. All questions were                        answered, and informed consent was obtained. Prior  Anticoagulants: The patient has taken Eliquis                        (apixaban), last dose was 2 days prior to procedure.                        ASA Grade Assessment: III - A patient with severe                        systemic disease. After reviewing the risks and                        benefits, the patient was deemed in satisfactory                        condition to undergo the procedure.                        After obtaining informed consent, the endoscope was                        passed under direct vision. Throughout the procedure,                        the patient's blood pressure, pulse, and oxygen                        saturations were monitored continuously. The                        Colonoscope was introduced through the ileoanal                        anastomosis via the anus and advanced to the the                        ileoanal pouch and into the neo-terminal ileum. The                        procedure was performed without difficulty. The                        patient tolerated the procedure well. The quality of                        the bowel preparation was good.                                                                                Findings:      Patient is status-post total colectomy with an ileal pouch-anal      anastomosis.      Anal fissures were found on anoscopy      The ileocolonic anastomosis contained a single (solitary)      circumferential ulcer, approximately 10mm in length. Oozing was present.      Biopsies were  taken with a cold forceps for histology. Coagulation for      bleeding prevention using argon plasma at 0.5 liters/minute and 25 watts      was successful.      The exam was otherwise without abnormality.                                                                                Estimated Blood Loss:      Estimated blood loss was minimal. Impression:            - Anal fissure found on perianal exam.                        - Anastomotic ulcer found. Biopsied. Treated with                        argon plasma coagulation (APC).                        - The examination was otherwise normal. Recommendation:        - Return patient to hospital ward for ongoing care.                        - Further recommendations can be made by the GI                        consult team. Please contact them if necessary.                                                                                Procedure Code(s):     --- Professional ---                        [phone], Endoscopic evaluation of small intestinal pouch (eg, Kock pouch, ileal reservoir [S or J]); with                        biopsy, single or multiple                        44799, Unlisted procedure, small intestine Diagnosis Code(s):     --- Professional ---                        K92.1, Melena (includes Hematochezia)                        K63.3, Ulcer of intestine                        K60.2, Anal fissure, unspecified CPT copyright 2022 [employer]. All rights reserved.  The codes documented in this report are preliminary and upon coder review may be revised to meet current compliance requirements. Electronically Signed By Barton Fanny, MD ________________________ Bronson Curb, MD 02/13/2022 4:17:48 PM The attending physician was present throughout the entire procedure including the insertion, viewing, and removal of the endoscope. This procedure note has been electronically signed by: Barton Fanny , MD ______________________ Garnette Czech, Number of Addenda: 0 Note Initiated On: 02/13/2022 2:53 PM    CT Abdomen Pelvis W IV Contrast    Result Date: 02/12/2022  EXAM: CT ABDOMEN PELVIS W CONTRAST ACCESSION: [id number] UN CLINICAL INDICATION: 21 years old with RLQ abd pain, hx [name], bleeding from rectum, eval further  COMPARISON: CT abdomen pelvis 01/10/2022 TECHNIQUE: A helical CT scan of the abdomen and pelvis was obtained following IV contrast from the lung bases through the pubic symphysis. Images were reconstructed in the axial plane. Coronal and sagittal reformatted images were also provided for further evaluation. FINDINGS: LOWER CHEST: No focal consolidation, pleural effusion, or pneumothorax. LIVER: Normal liver contour. Hepatic steatosis. Scattered subcentimeter hypodensities too small to characterize on CT. No focal liver lesions. BILIARY: Gallbladder is decompressed. No biliary ductal dilatation.  SPLEEN: Normal in size and contour. PANCREAS: Normal pancreatic contour.  No focal lesions.  No ductal dilation. ADRENAL GLANDS: Normal appearance of the adrenal glands. KIDNEYS/URETERS: Symmetric renal enhancement.  No hydronephrosis.  No solid renal mass. Unchanged 0.9 cm left interpolar renal cyst. BLADDER: Unremarkable. REPRODUCTIVE ORGANS: Anteverted uterus. 2.2 cm right adnexal cyst. GI TRACT: Total colectomy with ileoanal J pouch formation. There is similar tethering of bowel within the pelvis centered at the mass. No findings of bowel obstruction or acute inflammation.  PERITONEUM, RETROPERITONEUM AND MESENTERY: No free air.  No ascites.  No fluid collection. Multiple spiculated masses within the mesentery, unchanged in size compared to prior: -Left upper quadrant mass measuring 1.5 x 1.3 cm (2:57), previously 2.1 x 0.9 cm -Left lower quadrant mass measuring 4.3 x 3.8 cm (2:69), previously 4.8 x 3.3 cm -Right lower quadrant mass measuring 4.7 x 3.4 cm (2:82), previously 4.8 x 3.7 cm LYMPH NODES: No adenopathy. VESSELS: Hepatic and portal veins are patent.  Normal caliber aorta.  BONES and SOFT TISSUES: No aggressive osseous lesions. Unchanged appearance of nodular enhancement within the right rectus musculature (2:55).     -Unchanged size and distribution of multiple mesenteric masses compatible with desmoid tumors. -There is similar tethering of bowel within the pelvis, without evidence of obstruction.    ______________________________________________________________________  Discharge Instructions:     Activity Instructions       Activity as tolerated                  Other Instructions       Call MD for:  difficulty breathing, headache or visual disturbances      Call MD for:  persistent nausea or vomiting      Call MD for:  severe uncontrolled pain      Call MD for:  temperature >38.5 Celsius      Discharge instructions      You were admitted to the hospital for a GI Bleed caused by an ulcer near your anastamosis site. You underwent a pouchoscopy and the bleeding was stopped during the procedure. Your blood counts improved, however you continued to have significant abdominal pain. A CT scan of your abdomen did not show any clear source for the increased pain. It is most likely due to irritation of your known desmoid  tumors. You were seen by the pain team (Dr. Bufford Buttner) who helped wean you from IV dilaudid to a regimen of oral oxycodone. We are prescribing you a 7 day course of oral oxycodone at an increased dose (15 mg) that you can take every 4 hours as needed. Please follow up with your GI, oncology, and pain team. It was a pleasure taking care of you, we wish you the best!    Diagnosis: GI bleed, abdominal pain     Course complications:  -  significant abdominal pain    Follow up Appointments:   - 02/26/22 with Pain medicine  - 02/27/22 with GI  - 03/04/22 with Dr. [name]     --------------    When to Call Your [facility] Care Team:   Monday- Friday from 8:00 am - 5:00 pm: Call [phone] or Toll free [phone].  Ask to speak to the Triage Nurse  On Nights, Weekends and Holidays: Call [phone]. Ask the operator to page the Oncology Fellow on Call     RED ZONE:  Take action now!  You need to be seen right away. Call 911 or go to your nearest hospital for help.   - Symptoms are at a severe level of discomfort    - Bleeding that will not stop  - Chest Pain    - Hard to breathe    - Fall or passing out    - New Seizure    - Thoughts of hurting yourself or others     YELLOW ZONE: Take action today  This is NOT an all-inclusive list. Pleae call with any new or worsening symptoms.   Call your doctor, nurse or other healthcare provider at [phone]  You can be seen by a provider the same day through our Same Day Acute Care for Patients with Cancer program.   - Symptoms are new or worsening; You are not within your goal range for:    - Pain          - Swelling (leg, arm, abdomen, face, neck)    - Shortness of breath        - Skin rash or skin changes    - Bleeding (nose, urine, stool, wound)    - Wound issues (redness, drainage, re-opened) - Feeling sick to your stomach and throwing up    - Confusion    - Mouth sores/pain in your mouth or throat     - Vision changes   - Hard stool or very loose stools (increase in ostomy   - Fever >100.4 F, chills     Output)        - Worsening cough with mucus that is green, yellow or bloody   - No urine for 12 hours      - Pain or burning when going to the bathroom    - Feeding tube or other catheter/tube issue     - Home infusion pump issue - call [phone]   - Redness or pain at previous IV or port/catheter site    - Depressed or anxiety     GREEN ZONE: You are in control   Your symptoms are under control. Continue to take your medicine as ordered. Keep all visits to the doctor.   - No increase or worsening symptoms   - Able to take your medicine   - Able to drink and eat     For your safety and best care, please DO NOT use MyChart messages to report red  or yellow symptoms.   MyChart messages are only checked during weekday normal business hours and you should receive a   Response within 2 business days.   Please use MyChart only for the follows:   - Non-urgent medication refills, scheduling requests or general questions.           Patient Education:     - Wash your hands routinely with soap and water  - Take your temperature when you have chills or are not feeling well  - Use a soft toothbrush  - Avoid constipation or straining with bowel movements. This may mean you occasionally need to take over-the-counter stool softeners or laxatives.   - Avoid people who have colds or the flu, or are not feeling well.  - Wear a mask when visiting crowded places.  - Maintain a well-balanced diet and eat healthy foods  - Speak with your doctor before having any dental work done  - Do only as much activity as you can tolerate    Other instructions:  - Don't use dental floss if your platelet count is below 50,000. Your doctor or nurse should tell you if this is the case.  - Use any mouthwashes given to you as directed.  - If you can't tolerate regular brushing, use an oral swab (bristle-less) toothbrush, or use salt and baking soda to clean your mouth. Mix 1 teaspoon of salt and 1 teaspoon of baking soda into an 8-ounce glass of warm water. Swish and spit.  - Watch your mouth and tongue for white patches. This is a sign of fungal infection, a common side effect of chemotherapy. Be sure to tell your doctor about these patches. Medication/mouthwashes can be prescribed to help you fight the fungal infection.      COVID-19 is a new challenge, but [facility] and the [facility] is dedicated to providing you and your loved ones with the best possible cancer care and support in the safest way possible during this time. We made two videos about the ways we are working to keep you safe, such as offering the option to visit your care team over the phone or through a video, as well as support services offered for our patients and their caregivers. If you have any questions about your cancer care, please call your care team.     Video #1: Keeping [facility] Cancer Care patients safe during the COVID-19 crisis  http://go.eabjmlille.com     Video #2: Support for cancer patients and their caregivers during the COVID-19 pandemic  http://go.SecureGap.uy     Video #2: Support for cancer patients and their caregivers during the COVID-19 pandemic  http://go.SecureGap.uy            Follow Up instructions and Outpatient Referrals     Call MD for:  difficulty breathing, headache or visual disturbances      Call MD for:  persistent nausea or vomiting      Call MD for:  severe uncontrolled pain      Call MD for:  temperature >38.5 Celsius      Discharge instructions      Referral to Home Infusion      Performing location?: External    Home Health Requested Disciplines:  Nursing  Registered Dietitian        **Please contact your service pharmacist for assistance with discharge   home health infusion monitoring.          Appointments which have been scheduled for you  Feb 26, 2022 10:30 AM  (Arrive by 10:00 AM)  RETURN  CONSULT with Galen Daft, FNP  [facility] PAIN MANAGEMENT CENTER [address] [facility] [place] [facility] REGION) [address]  [address]  [place] [address]  [phone]        Feb 27, 2022  8:30 AM  (Arrive by 8:15 AM)  RETURN VIDEO HCP DIRECT LINK with Modena Nunnery, MD  [facility] GI MEDICINE [place])  Arrive at: This is a Video Visit [address]  [facility] 1 through 4  [place] [address]  [phone]   A direct link will be sent to you by your provider at the time of your video appointment. Please do NOT go to the clinic.     For your video visit, you will need a computer with a working camera, speaker and microphone, a smartphone, or a tablet with internet access.         Mar 04, 2022  9:00 AM  (Arrive by 8:30 AM)  LAB ONLY [place] with ADULT ONC LAB  [facility] ADULT ONCOLOGY LAB DRAW STATION [facility] REGION) [address]  [place] [address]  [phone]        Mar 04, 2022 10:00 AM  (Arrive by 9:30 AM)  RETURN ACTIVE [place] with Sheral Apley, MD  [place] [facility] MULTIDISCIPLINARY 2ND FLR CANCER HOSP [facility] REGION) [address]  [place] [address]  [phone]        Apr 01, 2022  8:30 AM  (Arrive by 8:00 AM)  RETURN PHARMD with Leanora Ivanoff, CPP  [facility] PAIN MANAGEMENT CENTER [address] [facility] [place] [facility] REGION) [address]  [address]  [place] [address]  [phone]        Apr 03, 2022  9:30 AM  (Arrive by 9:15 AM)  RETURN PEDS with Gala Romney, MD  [facility] DERMATOLOGY AND SKIN CANCER CENTER [place] [facility] REGION) [address] [place] [address]  [phone]        Jul 14, 2022 10:45 AM  (Arrive by 10:30 AM)  RETURN HEM BENIGN with Cassiopeia Anne Jorge Ny, PA  [facility] BENIGN HEMATOLOGY CLINIC [place] [facility] REGION) [address]  [facility] 1 through 4  [place] [address]  [phone]             ______________________________________________________________________  Discharge Day Services:  BP 101/53  - Pulse 90  - Temp 36.7 ??C (98.1 ??F) (Oral)  - Resp 16  - Ht 165.1 cm (5' 5)  - Wt 58.9 kg (129 lb 14.4 oz)  - LMP 01/15/2022 (Exact Date)  - SpO2 97%  - BMI 21.62 kg/m??   Pt seen on the day of discharge and determined appropriate for discharge.    Condition at Discharge: stable    Length of Discharge: I spent greater than 30 mins in the discharge of this patient.

## 2022-02-19 NOTE — Unmapped (Signed)
Department of Anesthesiology  Pain Medicine Division    Confidential Chronic Pain Consultation Psychology Note      Patient Name: [name] [name]  Medical Record Number: [id number]  Date of Service: February 19, 2022  Attending Psychologist: Ruffin Frederick, PhD  CPT Procedure Code: 56213 Initial Health & Behavior Assessment    Requesting Attending Physician:  Jeannine Boga, MD  Service Requesting Consult:  Oncology/Hematology (MDE)    Purpose of Consult: Interdisciplinary Pain Assessment and Recommendations; Case reviewed with Buena Irish, MD (Pain Medicine)    Subjective:   [name] is a 21 y.o.  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 Patient has since been in the hospital for pain management.     Pt and mom were both present for the session.     Pt reviewed significant anxiety prior to admission, triggered by bleeding. She felt pain was manageable prior to admission. She and mom reviewed their understanding of the pain, as well as series of major health events and admissions over the past 6 months. Pt acknowledged increase in anxiety around pain, discharge from hospital and risks of complications that could threaten her safety, well being and ability to pursue personal goals. Pt and mom were prompted to review evidence that her health is improving, that she is safe, and that she is ready to go home for continued recovery. They also described episodes of feeling invalidated by treatment team. Psychoeducation and validation presented today. Pt and mom were appreciative of discussion around pain, including different reasons for pain and treatment approaches, as well as risks associated with opiate regimen. Rationale for wean reviewed, as well as comprehensive approach to pain management. Pt reviewed some preoccupation with pain on right side, which was associated with bleeding; however, the pain is improved (not resolved) and she does feel confident bleeding is resolved. Activity goals, exercises to challenge fears and behavioral relaxation strategies reviewed in session. Pt has not been sleeping well until last night, noting that the pain was not as bad.     Of note, pt has had adverse reactions to certain psychotropic medications. Olanzapine has provided best relief of anxiety and helped her calm her body. Short term and long term opiate options reviewed, including pros/cons to each. Pt will review long term options with [name] in outpatient pain clinic.     Objective / Mental Status Exam:  Appearance:    Appears stated age and in bed   Motor:   No abnormal movements   Speech/Language:    Normal rate, volume, tone, fluency   Mood:   Depressed and Anxious   Affect:   Tired; medications just taken   Thought process:   Logical, linear, clear, coherent, goal directed   Thought content:    Denies SI, HI, self harm, delusions, obsessions, paranoid ideation, or ideas of reference   Perceptual disturbances:    Denies auditory and visual hallucinations, behavior not concerning for response to internal stimuli     Orientation:  Oriented to person, place, time, and general circumstances; not formally assessed   Attention:   Able to attend without fluctuations in consciousness   Concentration:   Able to concentrate and attend   Memory:   Immediate, short-term, long-term, and recall grossly intact; not formally assessed    Fund of knowledge:    Consistent with level of education and development; not formally  assessed   Insight:     Fair   Judgment:    Intact   Impulse Control:   Intact     Assessment:  [name] has a history of generalized anxiety disorder and chronic pain. She has experienced exacerbation of anxiety, along with increase in pain associated with bleeding. She struggles with anxiety related to unexpected medical events, which have been more common in recent months. She and mom are aware of increased anxiety and her pattern of questioning safety upon discharge; however, pt noted that she generally feels calmer at home. They responded well to introduction of CBT and use of evidence to challenge anxious thoughts. Behavioral relaxation and mindfulness in place, and more comprehensive approach to pain management encouraged as pt works to wean opiates. She responded well to psychoeducation around bidirectional relationship between anxiety and pain. Validation seems to alleviate some distress and encouraged more openness around different strategies for pain relief. No safety concerns noted; should be monitored when new medications are initiated.      Current Problem List:  Principal Problem:    GI bleeding  Active Problems:    [name]    [name]    Pain    Iron deficiency anemia due to chronic blood loss    History of colectomy      Recommendations:  [name] may benefit from:      Use of comprehensive approach to pain, allowing reduction in opiates. This can include massage, breathing exercises, mindful coloring, pleasant activity and daily activity. Pt encouraged to identify facts to guide her thoughts about pain and safety; mom engaged in this process today.    Pt hopes to engage in PT upon discharge home. Pt encouraged to go outside and gradually increase activity.    Pt will continue use of olanzapine for anxiety. She will follow up with psychiatry team for additional recommendations.    Pt will continue opiate wean and preparing for discharge. Providing a plan may help reduce anxiety.     Pt will work with [name] in outpatient clinic to develop a reasonable, low dose, effective COM regimen.    Pt will follow up with pain psychology in outpatient clinic.Marland Kitchen    Ruffin Frederick, PhD  Pain Psychologist

## 2022-02-19 NOTE — Unmapped (Signed)
Gastroenterology (Luminal) Consult Service   Progress Note         Assessment & Plan:   [name] [name] is a 21 y.o. female with a PMHx of [name] s/p IPA on TPN for nutritional support who presented to [facility] with acute on chronic anemia. The patient is seen in consultation at the request of Jeannine Boga,  MD (Oncology/Hematology (MDE)) for hematochezia     #Acute on chronic anemia, stable  #Hematochezia, resolved  Pouchoscopy 12/7 with circumferential ulceration at the anastomosis, treated with APC. She has had no further hematochezia and Hgb stable. Heme onc consulted and agree to stop DOAC.      #Acute on chronic abdominal pain  #Gaseous distension of small bowel in pelvis  Patient has had increased pain since procedure likely secondary to acute worsening of her chronic abdominal pain in the setting of ileus/possible narcotic bowel. She has now had improvement in bowel movements with miralax. We discussed with patient the need to stop IV pain medications and wean back to her chronic regimen. Chronic pain team also involved and agree with their thoughtful note regarding transition to oral narcotics, weaning narcotics, and use of adjunctive agents such as cymbalta.  - Agree with miralax bowel regimen  - Appreciate pain management consultation and agree with their recommendations    Recommendations discussed with the patient's primary team. GI team will sign off at this time.    For questions, contact the on-call fellow for the Gastroenterology (Luminal) Consult Service.    Interval History:   Today patient reports continued abdominal pain though had 2 BM with miralax.     Objective:   Temp:  [36.4 ??C (97.5 ??F)-36.9 ??C (98.4 ??F)] 36.4 ??C (97.5 ??F)  Heart Rate:  [78-85] 85  Resp:  [16] 16  BP: (93-108)/(50-60) 101/54  SpO2:  [95 %-99 %] 98 %    Gen: WDWN female in NAD, answers questions appropriately  Abdomen: Soft abdomen with tenderness to deep palpation in B/L lower quadrants. No rebound or guarding.    Pertinent Labs/Studies Reviewed:    Lab Results   Component Value Date    WBC 5.9 02/18/2022    HGB 8.5 (L) 02/18/2022    HCT 25.6 (L) 02/18/2022    PLT 248 02/18/2022       Lab Results   Component Value Date    NA 142 02/18/2022    K 3.6 02/18/2022    CL 107 02/18/2022    CO2 29.0 02/18/2022    BUN 14 02/18/2022    CREATININE 0.48 (L) 02/18/2022    GLU 124 02/18/2022    CALCIUM 8.7 02/18/2022    MG 2.0 02/18/2022    PHOS 5.0 02/18/2022       Lab Results   Component Value Date    BILITOT 0.3 02/12/2022    BILIDIR 0.20 12/04/2021    PROT 6.4 02/12/2022    ALBUMIN 3.4 02/12/2022    ALT 8 (L) 02/12/2022    AST 15 02/12/2022    ALKPHOS 39 (L) 02/12/2022    GGT 13 10/31/2011       Lab Results   Component Value Date    PT 11.7 02/12/2022    INR 1.05 02/12/2022    APTT 24.1 10/10/2020

## 2022-02-19 NOTE — Unmapped (Signed)
Pt left unit with mother in wheelchair. Pt with belongings and meds from pharmacy. Pt discharging to home. Pt hemodynamically stable prior to discharge. Discharge instructions provided to pt and mother, questions and concerns addressed.  Return precautions reviewed. Tunneled catheter heparinized so can be accessed for home TPN this evening.    Problem: Adult Inpatient Plan of Care  Goal: Plan of Care Review  Outcome: Resolved  Goal: Patient-Specific Goal (Individualized)  Outcome: Resolved  Goal: Absence of Hospital-Acquired Illness or Injury  Outcome: Resolved  Goal: Optimal Comfort and Wellbeing  Outcome: Resolved  Goal: Readiness for Transition of Care  Outcome: Resolved  Goal: Rounds/Family Conference  Outcome: Resolved     Problem: Latex Allergy  Goal: Absence of Allergy Symptoms  Outcome: Resolved     Problem: Pain Acute  Goal: Optimal Pain Control and Function  Outcome: Resolved     Problem: Malnutrition  Goal: Improved Nutritional Intake  Outcome: Resolved

## 2022-02-19 NOTE — Unmapped (Signed)
Patient is alert and oriented x4, afebrile with stable VS. Pt with some c/o pain partially relieved by PRN pain medication. Up ad lib and independent. Fall precautions and pt safety maintained. Skin intact. No major events during the shift. Emotional and educational support provided.      Problem: Adult Inpatient Plan of Care  Goal: Plan of Care Review  Outcome: Ongoing - Unchanged  Goal: Patient-Specific Goal (Individualized)  Outcome: Ongoing - Unchanged  Goal: Absence of Hospital-Acquired Illness or Injury  Outcome: Ongoing - Unchanged  Intervention: Identify and Manage Fall Risk  Recent Flowsheet Documentation  Taken 02/18/2022 0845 by Joyice Faster, RN  Safety Interventions:   low bed   lighting adjusted for tasks/safety   fall reduction program maintained   environmental modification   infection management   nonskid shoes/slippers when out of bed  Intervention: Prevent Skin Injury  Recent Flowsheet Documentation  Taken 02/18/2022 0845 by Joyice Faster, RN  Device Skin Pressure Protection: absorbent pad utilized/changed  Skin Protection: adhesive use limited  Intervention: Prevent Infection  Recent Flowsheet Documentation  Taken 02/18/2022 0845 by Joyice Faster, RN  Infection Prevention:   cohorting utilized   hand hygiene promoted  Goal: Optimal Comfort and Wellbeing  Outcome: Ongoing - Unchanged  Goal: Readiness for Transition of Care  Outcome: Ongoing - Unchanged  Goal: Rounds/Family Conference  Outcome: Ongoing - Unchanged     Problem: Latex Allergy  Goal: Absence of Allergy Symptoms  Outcome: Ongoing - Unchanged     Problem: Pain Acute  Goal: Optimal Pain Control and Function  Outcome: Ongoing - Unchanged     Problem: Malnutrition  Goal: Improved Nutritional Intake  Outcome: Ongoing - Unchanged

## 2022-02-19 NOTE — Unmapped (Signed)
Problem: Malnutrition  Goal: Improved Nutritional Intake  Outcome: Ongoing - Unchanged  Intervention: Promote and Optimize Oral Intake  Flowsheets (Taken 02/19/2022 0653)  Oral Nutrition Promotion:   calorie-dense foods provided   calorie-dense liquids provided  Nutrition Interventions: frequent small meals provided  Note: PN and fat emulsion infusing via pump to right chest double lumen without complications, VSS, afebrile, on room air, prn oxycodone 15 mg given for pain, safety maintained, call bell in reach, see labs in EPIC, pt mother at beside.  Intervention: Optimize Nutrition Delivery  Flowsheets (Taken 02/19/2022 [phone])  Nutrition Support Management:   parenteral nutrition composition adjusted   parenteral nutrition initiated     Problem: Adult Inpatient Plan of Care  Goal: Absence of Hospital-Acquired Illness or Injury  Intervention: Identify and Manage Fall Risk  Recent Flowsheet Documentation  Taken 02/18/2022 2000 by Juanita Craver, RN  Safety Interventions:   fall reduction program maintained   family at bedside   lighting adjusted for tasks/safety   low bed   room near unit station  Intervention: Prevent Skin Injury  Recent Flowsheet Documentation  Taken 02/18/2022 2000 by Juanita Craver, RN  Positioning for Skin: Supine/Back  Device Skin Pressure Protection: adhesive use limited  Skin Protection: protective footwear used

## 2022-02-19 NOTE — Telephone Encounter (Signed)
Forms received and put in Samantha's folder to sign.

## 2022-02-20 DIAGNOSIS — Z932 Ileostomy status: Secondary | ICD-10-CM | POA: Diagnosis not present

## 2022-02-20 DIAGNOSIS — E860 Dehydration: Secondary | ICD-10-CM | POA: Diagnosis not present

## 2022-02-20 DIAGNOSIS — R112 Nausea with vomiting, unspecified: Secondary | ICD-10-CM | POA: Diagnosis not present

## 2022-02-20 DIAGNOSIS — R1084 Generalized abdominal pain: Secondary | ICD-10-CM | POA: Diagnosis not present

## 2022-02-20 MED ORDER — OXYCODONE 5 MG TABLET
ORAL_TABLET | ORAL | 0 refills | 9.00000 days | Status: CN | PRN
Start: 2022-02-20 — End: 2022-03-22

## 2022-02-20 NOTE — Telephone Encounter (Signed)
This encounter was created in error - please disregard.

## 2022-02-21 DIAGNOSIS — E860 Dehydration: Secondary | ICD-10-CM | POA: Diagnosis not present

## 2022-02-21 DIAGNOSIS — R1084 Generalized abdominal pain: Secondary | ICD-10-CM | POA: Diagnosis not present

## 2022-02-21 DIAGNOSIS — R112 Nausea with vomiting, unspecified: Secondary | ICD-10-CM | POA: Diagnosis not present

## 2022-02-21 DIAGNOSIS — Z932 Ileostomy status: Secondary | ICD-10-CM | POA: Diagnosis not present

## 2022-02-21 NOTE — Unmapped (Signed)
[name] [name] has been contacted in regards to their refill of Sorafenib. At this time, they have declined refill due to medication being on hold. Refill assessment call date has been updated per the patient's request.

## 2022-02-21 NOTE — Unmapped (Signed)
Spoke to [place] on the phone. She reports that for the last 3 days she has been losing her hair. All her pubic hair, some body hair and the hair on her head is thinning. She is asking what might be causing it. She stopped her sorafenib in the hospital due to her GIB, and wonders if not taking it might have caused the sudden hair loss. Discussed with both pharmacist and MD. No obvious reason anyone can tell. Possible delayed thinning from when taking the med. Possibly nutritional/hormonal. We will move her appointment to 12/22 for labs and visit with MD.

## 2022-02-21 NOTE — Telephone Encounter (Signed)
Aldona Bar signed forms given to front office to fax.

## 2022-02-22 DIAGNOSIS — R112 Nausea with vomiting, unspecified: Secondary | ICD-10-CM | POA: Diagnosis not present

## 2022-02-22 DIAGNOSIS — E860 Dehydration: Secondary | ICD-10-CM | POA: Diagnosis not present

## 2022-02-22 DIAGNOSIS — R1084 Generalized abdominal pain: Secondary | ICD-10-CM | POA: Diagnosis not present

## 2022-02-22 DIAGNOSIS — Z932 Ileostomy status: Secondary | ICD-10-CM | POA: Diagnosis not present

## 2022-02-22 MED ORDER — OLANZAPINE 7.5 MG TABLET
ORAL_TABLET | 1 refills | 0 days | Status: CP
Start: 2022-02-22 — End: ?

## 2022-02-23 DIAGNOSIS — R112 Nausea with vomiting, unspecified: Secondary | ICD-10-CM | POA: Diagnosis not present

## 2022-02-23 DIAGNOSIS — E860 Dehydration: Secondary | ICD-10-CM | POA: Diagnosis not present

## 2022-02-23 DIAGNOSIS — R1084 Generalized abdominal pain: Secondary | ICD-10-CM | POA: Diagnosis not present

## 2022-02-23 DIAGNOSIS — Z932 Ileostomy status: Secondary | ICD-10-CM | POA: Diagnosis not present

## 2022-02-24 DIAGNOSIS — Z932 Ileostomy status: Secondary | ICD-10-CM | POA: Diagnosis not present

## 2022-02-24 DIAGNOSIS — E860 Dehydration: Secondary | ICD-10-CM | POA: Diagnosis not present

## 2022-02-24 DIAGNOSIS — R112 Nausea with vomiting, unspecified: Secondary | ICD-10-CM | POA: Diagnosis not present

## 2022-02-24 DIAGNOSIS — R1084 Generalized abdominal pain: Secondary | ICD-10-CM | POA: Diagnosis not present

## 2022-02-25 ENCOUNTER — Telehealth: Admit: 2022-02-25 | Discharge: 2022-02-26 | Payer: PRIVATE HEALTH INSURANCE

## 2022-02-25 DIAGNOSIS — D48119 Desmoid tumor of unspecified site: Secondary | ICD-10-CM | POA: Diagnosis not present

## 2022-02-25 DIAGNOSIS — R112 Nausea with vomiting, unspecified: Secondary | ICD-10-CM | POA: Diagnosis not present

## 2022-02-25 DIAGNOSIS — Z932 Ileostomy status: Secondary | ICD-10-CM | POA: Diagnosis not present

## 2022-02-25 DIAGNOSIS — Z515 Encounter for palliative care: Secondary | ICD-10-CM | POA: Diagnosis not present

## 2022-02-25 DIAGNOSIS — R1084 Generalized abdominal pain: Secondary | ICD-10-CM | POA: Diagnosis not present

## 2022-02-25 DIAGNOSIS — R1030 Lower abdominal pain, unspecified: Secondary | ICD-10-CM | POA: Diagnosis not present

## 2022-02-25 DIAGNOSIS — E860 Dehydration: Secondary | ICD-10-CM | POA: Diagnosis not present

## 2022-02-25 DIAGNOSIS — F411 Generalized anxiety disorder: Secondary | ICD-10-CM | POA: Diagnosis not present

## 2022-02-25 NOTE — Unmapped (Signed)
OUTPATIENT ONCOLOGY PALLIATIVE CARE    Principal Diagnosis: [name]. [name] [name] is a 21 y.o. female with [name] and desmoid fibromatosis, diagnosed in early childhood and continues to follow with pediatric oncologist Dr Ciro Backer with plans to transition to the medical oncology / sarcoma team in the coming year. Disease sites include paraspinal, chest wall, right arm/wrist, left lower extremity, back. The palliative care team was consulted as part of the [facility] sarcoma collaborative program to provide early, ongoing, and individualized support to young adults with sarcoma.    Assessment/Plan:   1. 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 Trial of duloxetine led to adverse effect of increased nausea so stopped taking. Receiving support from [facility].  -- Continue with therapist in [place] who offered EMDR due to presumed medical PTSD  -- Working with [name], LCSW, when not in [place]  -- Continue mirtazapine 7.5mg  qHS and olanzapine 7.5mg     2. Desmoid-associated pain: had been exacerbated by menses, pain medicines had been paused on account of nursing school studies/demands, but due to poor pain control and inability to focus, plan was made to take a semester off. Avoiding NSAIDs per GI.  -- Continue pain regimen per Dr Theora Gianotti pain clinic, currently using oxycodone 10mg  q4h daytime and oxycodone 5mg  q4h as needed for breakthrough  -- Continue Tylenol 1,000mg  TID  -- Continue gabapentin 200mg  at bedtime  -- Continue Baclofen for internal muscle cramping, 5mg  PRN  -- Continue BID cefdinir 300mg  for pouchitis prophylaxis  -- Continue famotidine 20mg  nightly  -- Trigger point injections helpful for shoulder pain  -- Had 1 nerve block that seemed to precipitate a pain episode  -- Mostly on bowel rest with TPN for now, will see GI and discuss modifications then    3. Nausea: Intermittent, improved with less oral intake  -- Continue ondansetron 8mg  ODT q8h PRN    4. Hair thinning/hair loss: big source of distress, wonders if related to being on or coming off sorafenib  -- Will discuss with Dr [name] at upcoming appointment    5. Coping/GOC: Doing poorly at this time; feels well supported by family and clinical team. School has become more supportive after administration changes.      # Controlled substances risk management.   - Patient does not have a signed pain medication agreement with our team. We discussed the details of the contract during our 07/16/21 visit.   - [place] database was reviewed today and it  N/A  appropriate.   - Urine drug screen was not performed at this visit. Findings: not applicable.   - Patient has received information about safe storage and administration of medications.   - Patient has not received a prescription for narcan; is not applicable.       F/u: PRN, care coordination    ----------------------------------------  Referring Provider: Debbe Mounts, MD  Oncology Team: Sarcoma -- Dr [name]  PCP: Jarold Motto, [facility]      HPI: [name] [name] is a 21 y.o. woman with [name] and desmoid fibromatosis who presented in early childhood and has been followed closely by pediatric oncology. 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 She is in nursing school and has experienced worsening anxiety over course of past months to years preceding  her introduction to the medical oncology team in December 2021. Had a bowel obstruction and was on TPN in the past; more recently experiencing severe abdominal pain associated with intra-abdominal desmoids and considering nerve blocks and other non-traditional pain management strategies.    Current cancer-directed therapy: back on low-dose sorafenib; s/p sorafenib x 6 months w/significant toxicity and potentially confounding factors, also s/p loop ileostomy and takedown with J-pouch.    Today's update (Sable Knoles via video, 02/25/22):  -- Has not been feeling well, sluggish, not as great an energy boost back on TPN, labs pending from this AM  -- Lots of hair loss especially on legs, private areas, thinning hair on head; wonders if this is a symptom of being off chemo versus on chemo versus vitamin deficiency; plans to discuss with Dr [name] later this week  -- Night sweats while on chemo, has improved but not resolved  -- Pain has increased in abdomen, also has fluid weight in abdomen; using pain medication around the clock (oxycodone 10mg  + 5mg  breakthrough), has not started buprenorphine but that will be next step when pain better managed, will visit pain clinic tomorrow to discuss alternative long-acting meds  -- TPN 12h daily cycle, will see GI again soon to discuss next steps, currently eating small amounts approx 1 meal in total daily intake, nausea under better control, does not need regular antiemetic  -- Anxiety under better control with current nighttime regimen of olanzapine (7.5mg ) and mirtazapine (7.5mg ) also takes 200mg  gabapentin  -- Glad to be home but worried about getting sick again, anxious to see today's bloodwork results    Today's update (Stein/Johnnette Laux/Swift, in clinic 12/31/21):  -- Increased weakness, abdominal pain, frequent (up to 15x/day) bowel movements, also with skin changes and hair loss  -- Describes feeling miserable and unable to function with current status  -- Pain has been exacerbated by feeds so has decreased volumes and slowed the rate  -- Saw GI, now on cefdinir to help with bowel movements, possible pouchitis  -- Wondering about TPN for bowel rest, also curious about a G-tube to replace NG  -- Will see Dr [name] with pain management clinic tomorrow  -- Wondering about fertility and how it would be affected if switches to new desmoid medication known to cause amenorrhea    Historical (Ardyn Forge, virtual 08/13/21):  -- Still having back pain and winged scapula, had improved somewhat but worsened after more activity during a mission trip in [place]  -- Will take 1,000mg  Tylenol twice a day and 2-3 tabs of 5mg  oxycodone, has been trying to use less than needed because wasn't sure when she would get to meet and get a refill, also hadn't brought sufficient quantity to [facility]  -- Nausea has been better since starting ondansetron 8mg , only occurring once a day  -- Feels like she got dehydrated last week, wondering about IV fluids    Today's update (Glendel Jaggers in clinic 07/16/21):  -- Pain worsening, very positional; grasp strength decreased, shaking and cramping in R hand, numbness in pinky. Using 5mg  oxycodone with 1,000mg  Tylenol twice a day approximately, may add 2.5mg  here and there, has about 5 tablets remaining. Pain can get down to 2-3/10 depending on timing and extent of pain prior to taking medication  -- ROM severely impacted by pain though trying to stay as mobile as possible to avoid causing more problems  -- Nausea exacerbated by pain and responding better to 8mg  ondansetron  -- PT trying to work with her but not  without a specific order to focus on R shoulder    Today's update (Orella Cushman via video, 07/09/21):  -- Pain has been flaring a lot over the past week, up to 8/10 or more, at one point she passed out from the pain though wondering if she was also dehydrated  -- Triggers include movement and sitting/pressing against a chair, pain described as super heavy feeling resembling prior desmoid pain rather than muscular  -- Attempted to work with PT yesterday but they require a specific order for shoulder  -- Oxycodone 5mg  will help it improve to 4/10, onset is 30 minutes and relief lasts about 5 hours  -- Using up to 3 tabs daily and 100mg  gabapentin at night  -- Shaking in R hand is worse than last week  -- J-pouch currently not affected by oxycodone   -- Using 7.5mg  mirtazapine mostly for sleep because appetite has been good  -- Nausea somewhat controlled with Zofran 4mg  ODT but tends to become nauseated from pain and prefers to try a higher dose    Interval history (Levert Heslop via video, 07/02/21):  -- Had been doing well managing tumor pain for many months  -- Last week developed new changes in R shoulder with pain refractory to Tylenol  -- Saw PT yesterday, described as winged and feels like it's caught under shoulder blade, did not want to manipulate due to concern about pinched nerve  -- Mentioned that there are surgical changes in that area from prior resections  -- Pain is described as sharp, shooting at times, can involve entire R arm, had some intermittent numbness in last 2 fingers of R hand and some increased shakiness  -- Tylenol (1,000mg ) has been somewhat helpful and tried 5mg  oxycodone with some relief  -- Completing this semester of nursing school this week and hoping to return full time next semester    Interval history, Sona Nations via video 03/26/21:  -- Pain in area of clot, causing swelling in arm and neck, will experience both deep throbbing achy pain as well as sharp, shooting  -- Has been using Tramadol 50mg  once a day and oxycodone 5mg  1-2x/day for a 9/10, will kick in around 30 minutes later and last about 4 hours, gets the pain down to a 4/10  -- Was not able to go out over the weekend due to pain issues  -- Appetite has improved and weight gain is better but some is swelling  -- Hoping the TPN will be discontinued within the next 3-4 weeks  -- Now on 15mg  mirtazapine which is helpful with appetite  -- GI pain has improved, rectal pain improved, uses Bentyl when there's pouch cramping  -- Has had some reflux symptoms which have improved  -- Working with [name] bc spending less time in [place]    Today's update (Kansas Spainhower/Swift/Stein, 11/20/2020):  -- Worsening debilitating cramping pain at site of J-pouch with blood in stools and incontinence  -- Pain is intermittent and unpredictable: cramping not responding to Tylenol/ibuprofen but overall useful, and gabpentin helpful at bedtime  -- Experiencing some lack of energy  -- Very mild nausea and somewhat poor appetite  -- No SOB  -- Mood not great, also with anxiety, related to new/progressive symptoms  -- Overall QOL rated as a 2 out of 7 due to above issues all related to new/worsening pain episodes of unclear etiology  -- Wondering about a cyst on R thigh which is growing, and the abnormality on MRI of R thigh  --  Also curious about cysts in liver and kidney, wonders how often she will need surveillance scans    Interval history (Shivali Quackenbush via video, 11/13/2020):  -- Pain: regimen discussed was causing lots of grogginess, specifically the gabapentin. Tried to stop taking medications so that she could focus on studies. Also began menstruating this week which significantly exacerbated her pain. Unfortunately, her uncontrolled pain made studying very difficult, and she felt unprepared for upcoming exams and clinical rotations. Decision was made to take a semester off from nursing school to focus on her own mental and physical health so that she is best equipped to help care for others as she progresses through her nursing program.  -- Successfully cut out oxycodone except for prior to scans and once when menstrual pain was especially severe  -- Rectal irritation persistent, addition of Vaseline has been helpful, but there is significant swelling between vagina and anus. She also thinks she saw blood coming from the J-pouch that was separate from menstrual bleeding.    Interval history (Cylie Dor, video visit 11/06/2020):  Overall doing well. Nursing school with new administration, now more accommodating. Clinicals start Sept 23rd and she has some questions related to feeling somewhat nervous about going back to campus  -- Lots of activity / exertion, requesting a parking pass  -- Testing accommodations due to frequency of J pouch emptying (6-7x/day)  -- Had received a lot of saline bags due to ease of dehydration, wondering if they can still be used, possibly at school, wonders about having a note for school clinic    Last week central line came out, had been using home TPN due to a blockage, doing PT 2-3x/week to get strength back up. Her second reversal surgery went well and she is maintaining weight well. Says overall doing well though has been difficult to learn everything related to caring for herself after surgeries. She said she recently started to develop more pain and swelling at the surgical site, was seen by surgery team, given additional doses of 10mg  oxycodone. Says she can't tell when her bladder is full and she has to urinate, and that her bladder pain seems to be triggered by J-pouch. Also with some rectal irritation, using a cream that includes menthol and zinc oxide -- rec vaseline    Pain at surgical site: described as a bunch of different pains including deep, dull pain, sharp pain, stretching/muscle pain not relieved by muscle relaxant, had been told the pain is related to the long incision, radiates around toward R hip; has been using heat and ice, wearing a binder recommended by PT, stopped wearing it overnight. Will take 400mg  ibuprofen approximately twice a day. Will also use gabapentin as needed and also a muscle relaxant. Pain tends to fluctuate without an obvious trigger. Oxycodone 10mg  has been helping at night. Would like to avoid opioids if possible, wondering about a different regimen.    Interval events: (Joint visit w/Dr Coralyn Helling AYA NP, and [name] AYA SW):   -- Worsening GI symptoms since colonoscopy including: bleeding (most concerning aspect), occurs not only with bowel movements but also without; increased frequency of restroom use and sometimes feels the urge but no bowel movement just blood; intense cramping abdominal pain that did not improve with dietary changes. Dr Cheron Schaumann (GI) prescribed a new anti-spasmodic medication but she hasn't started it yet. Dr Elenore Rota (GI surgeon) did not recommend making a bunch of dietary modifications due to upcoming surgery scheduled for early June. Mirtazapine (7.5mg ) has  been helpful for nausea.  -- Nutrition has been an issue, has been using smoothies but concerned about how to get adequate nutrition before and after surgery; has been started on vitamin supplementation by various clinicians, currently on B12 injections and a prenatal vitamin  -- Endocrinology Dr Timmothy Euler sent labs to check but they were delayed due to recent Depo shot which he advised not to continue, recommends Lo-lo-estrin  -- Some muscle spasms/clenching, Dr Selena Batten and Dr Darral Dash were expected to be in communication about whether or not a L thigh mass might be desmoid.  -- Was seen by campus health and labs revealed low Vit D, low B9, relatively unremarkable iron labs, started on a prenatal multivitamin  -- Beach trip in May and friend's wedding at [place] end of May, will also visit [employer]. Looking forward to some fun times before the surgery and really dreading the possibility of an ostomy    Next steps, as communicated to Drs Ciro Backer and Elenore Rota:  -- Nutrition (ASAP, Lance Bosch)  -- Check back in with endocrinology re low-dose progesterone only OCP  -- Meet with pediatric outpatient oncology RN if possible Silvestre Moment to connect)  -- May = carefree month of fun (beach, [employer]/wedding, Transport planner)  -- Surgery (early June)  -- Imaging (July/August) for baseline scans, Dr [name] to discuss w/Dr Darral Dash  -- Consider pazopanib vs retry sorafenib in future vs nirogacestat    Symptom Review (early 2022, did not discuss in detail at today's visit)  General: doing well, healing well after steroid injection  Pain: pain in tumors, particularly in chest (R side between muscle and rib, L surface), for past 2 weeks, attributes to carrying heavy backpacks; has tried heat packs but not super helpful; has used Tylenol (500mg ) 3-4x this week; also with muscle cramps in legs and sometimes in her arms, notices at bedtime right after taking Remeron  Fatigue: often, better this semester  Mobility: good  Sleep: improved on Remeron  Appetite: improved on Remeron  Nausea: has worsened over past year or so, tends to occur daily, will worsen with higher anxiety days  Bowel function: not an issue  Dyspnea: none  Secretions: not an issue  Mood: increased anxiety over past months to a year      Palliative Performance Scale: 80% - Ambulation: Full / Normal Activity with effort, some evidence of disease / Self-Care:Full / Intake: Normal or reduced / Level of Conscious: Full     Coping/Support Issues: supported by family (parents greatest source of support) and nursing program, looking forward to getting her nursing degree and very dedicated to her work/studies, which made taking a break from school an especially difficult decision.    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  Hobbies: not discussed  Current residence / distance from [facility]: lives in [place] when not at nursing school, approximately 1 hour from [facility]    Advance Care Planning:   HCPOA: unknown  Natural Runner, broadcasting/film/video: mother  Living Will: unknown  ACP note: none        Objective     Opioid Risk Tool:    Female  Female    Family history of substance abuse      Alcohol  1  3    Illegal drugs  2  3    Rx drugs  4  4    Personal history of substance abuse      Alcohol  3  3    Illegal drugs  4  4    Rx drugs  5  5    Age between 16--45 years  1  1    History of preadolescent sexual abuse  3  0    Psychological disease      ADD, OCD, bipolar, schizophrenia  2  2 Depression  1  1       Total: not assessed to date  (<3 low risk, 4-7 moderate risk, >8 high risk)    Oncology History    No history exists.       Patient Active Problem List   Diagnosis    [name]    Intestinal polyps    [name]    Benign neoplasm of skin of trunk    Other benign neoplasm of connective and other soft tissue of upper limb, including shoulder    History of colonic polyps    [name] of skin    Thyroid cyst    Syncope    Chemotherapy-induced nausea    Pain    Iron deficiency anemia due to chronic blood loss    Dehydration    Physical deconditioning    History of colectomy    Acute deep vein thrombosis (DVT) of axillary vein of right upper extremity (CMS-HCC)    Severe protein-calorie malnutrition (CMS-HCC)    Winged scapula of right side    Lower abdominal pain    Intractable abdominal pain    Generalized anxiety disorder with panic attacks    Hyperglycemia    Hypoglycemia    SBO (small bowel obstruction) (CMS-HCC)    GI bleeding       Past Medical History:   Diagnosis Date    Abdominal pain     Acid reflux     occas    Anesthesia complication     itching, shaking, coldness; last few surgeries have gone much better    Cancer (CMS-HCC)     Cataract of right eye     COVID-19 virus infection 01/2019    Cyst of thyroid determined by ultrasound     monitoring    [name]     2 right forearm, 1 left thigh, 1 right scapula, 1 under left clavicle; multiple    Difficult intravenous access     [name] (familial adenomatous polyposis)     [name]     Gastric polyps     History of chemotherapy     last treatment approx 05/2019    History of colon polyps     History of COVID-19 01/2019    Iron deficiency anemia due to chronic blood loss     received iron infusion 11-2019    PONV (postoperative nausea and vomiting)     Rectal bleeding     Syncopal episodes     especially if becoming dehydrated       Past Surgical History:   Procedure Laterality Date    cyroablation      cystis removal desmoid removal      PR CLOSE ENTEROSTOMY,RESEC+ANAST N/A 10/09/2020    Procedure: ILEOSTOMY TAKEDOWN;  Surgeon: Mickle Asper, MD;  Location: OR [place];  Service: General Surgery    PR COLONOSCOPY W/BIOPSY SINGLE/MULTIPLE N/A 10/27/2012    Procedure: COLONOSCOPY, FLEXIBLE, PROXIMAL  TO SPLENIC FLEXURE; WITH BIOPSY, SINGLE OR MULTIPLE;  Surgeon: Shirlyn Goltz Mir, MD;  Location: PEDS PROCEDURE ROOM [facility];  Service: Gastroenterology    PR COLONOSCOPY W/BIOPSY SINGLE/MULTIPLE N/A 09/14/2013    Procedure: COLONOSCOPY, FLEXIBLE, PROXIMAL TO SPLENIC FLEXURE; WITH BIOPSY, SINGLE OR MULTIPLE;  Surgeon: Shirlyn Goltz Mir, MD;  Location: PEDS PROCEDURE ROOM [facility];  Service: Gastroenterology    PR COLONOSCOPY W/BIOPSY SINGLE/MULTIPLE N/A 11/08/2014    Procedure: COLONOSCOPY, FLEXIBLE, PROXIMAL TO SPLENIC FLEXURE; WITH BIOPSY, SINGLE OR MULTIPLE;  Surgeon: Arnold Long Mir, MD;  Location: PEDS PROCEDURE ROOM [facility];  Service: Gastroenterology    PR COLONOSCOPY W/BIOPSY SINGLE/MULTIPLE N/A 12/26/2015    Procedure: COLONOSCOPY, FLEXIBLE, PROXIMAL TO SPLENIC FLEXURE; WITH BIOPSY, SINGLE OR MULTIPLE;  Surgeon: Arnold Long Mir, MD;  [facility]: PEDS PROCEDURE RO[facility];  Service: Gastroenterology    PR COLONOSCOPY W/BIOPSY SINGLE/MULTIPLE N/A 09/02/2017    Procedure: COLONOSCOPY, FLEXIBLE, PROXIMAL TO SPLENIC FLEXURE; WITH BIOPSY, SINGLE OR MULTIPLE;  Surgeon: Arnold Long Mir, MD;  Location: PEDS PROCEDURE ROOM [place];  Service: Gastroenterology    PR COLSC FLX W/REMOVAL LESION BY HOT BX FORCEPS N/A 08/27/2016    Procedure: COLONOSCOPY, FLEXIBLE, PROXIMAL TO SPLENIC FLEXURE; W/REMOVAL TUMOR/POLYP/OTHER LESION, HOT BX FORCEP/CAUTE;  Surgeon: Arnold Long Mir, MD;  Location: PEDS PROCEDURE ROOM [facility];  Service: Gastroenterology    PR COLSC FLX W/RMVL OF TUMOR POLYP LESION SNARE TQ N/A 02/25/2019    Procedure: COLONOSCOPY FLEX; W/REMOV TUMOR/LES BY SNARE;  Surgeon: Helyn Numbers, MD;  Location: GI PROCEDURES [facility];  Service: Gastroenterology    PR COLSC FLX W/RMVL OF TUMOR POLYP LESION SNARE TQ N/A 03/13/2020    Procedure: COLONOSCOPY FLEX; W/REMOV TUMOR/LES BY SNARE;  Surgeon: Helyn Numbers, MD;  Location: [facility] PROCEDURES [facility];  Service: Gastroenterology    PR EXC SKIN BENIG 2.1-3 CM TRUNK,ARM,LEG Right 02/25/2017    Procedure: EXCISION, BENIGN LESION INCLUDE MARGINS, EXCEPT SKIN TAG, LEGS; EXCISED DIAMETER 2.1 TO 3.0 CM;  Surgeon: Clarene Duke, MD;  Location: CHILDRENS OR [facility];  Service: Plastics    PR EXC SKIN BENIG 3.1-4 CM TRUNK,ARM,LEG Right 02/25/2017    Procedure: EXCISION, BENIGN LESION INCLUDE MARGINS, EXCEPT SKIN TAG, ARMS; EXCISED DIAMETER 3.1 TO 4.0 CM;  Surgeon: Clarene Duke, MD;  Location: CHILDRENS OR [facility];  Service: Plastics    PR EXC SKIN BENIG >4 CM FACE,FACIAL Right 02/25/2017    Procedure: EXCISION, OTHER BENIGN LES INCLUD MARGIN, FACE/EARS/EYELIDS/NOSE/LIPS/MUCOUS MEMBRANE; EXCISED DIAM >4.0 CM;  Surgeon: Clarene Duke, MD;  Location: CHILDRENS OR [facility];  Service: Plastics    PR EXC TUMOR SOFT TISSUE LEG/ANKLE SUBQ 3+CM Right 08/05/2019    Procedure: EXCISION, TUMOR, SOFT TISSUE OF LEG OR ANKLE AREA, SUBCUTANEOUS; 3 CM OR GREATER;  Surgeon: Arsenio Katz, MD;  Location: MAIN OR [place];  Service: Plastics    PR EXC TUMOR SOFT TISSUE LEG/ANKLE SUBQ <3CM Right 08/05/2019    Procedure: EXCISION, TUMOR, SOFT TISSUE OF LEG OR ANKLE AREA, SUBCUTANEOUS; LESS THAN 3 CM;  Surgeon: Arsenio Katz, MD;  Location: MAIN OR [facility];  Service: Plastics    PR LAP, SURG PROCTECTOMY W J-POUCH N/A 08/10/2020    Procedure: ROBOTIC ASSISTED LAPAROSCOPIC PROCTOCOLECTOMY, ILEAL J POUCH, WITH OSTOMY;  Surgeon: Mickle Asper, MD;  Location: OR [place];  Service: General Surgery    PR NDSC EVAL INTSTINAL POUCH DX W/COLLJ SPEC SPX N/A 01/23/2021    Procedure: ENDO EVAL SM INTEST POUCH; DX;  Surgeon: Modena Nunnery, MD;  Location: GI PROCEDURES [facility] [facility];  Service: Gastroenterology PR NDSC EVAL INTSTINAL POUCH DX W/COLLJ SPEC SPX N/A 08/27/2021    Procedure: ENDO EVAL SM INTEST POUCH; DX;  Surgeon: Hunt Oris, MD;  Location: [facility];  Service: Gastroenterology    PR NDSC EVAL INTSTINAL POUCH DX W/COLLJ SPEC SPX N/A 12/09/2021    Procedure: ENDO EVAL SM INTEST POUCH; DX;  Surgeon: Vidal Schwalbe, MD;  Location: [facility];  Service: Gastroenterology    PR NDSC EVAL INTSTINAL POUCH W/BX SINGLE/MULTIPLE N/A 01/20/2022    Procedure: ENDOSCOPIC EVAL OF SMALL INTESTINAL POUCH; DIAGNOSTIC, No biopsies;  Surgeon: Andrey Farmer, MD;  Location: [facility];  Service: Gastroenterology    PR NDSC EVAL INTSTINAL POUCH W/BX SINGLE/MULTIPLE N/A 02/13/2022    Procedure: ENDOSCOPIC EVAL OF SMALL INTESTINAL POUCH; DIAGNOSTIC, WITH BIOPSY;  Surgeon: Bronson Curb, MD;  Location: [facility];  Service: Gastroenterology    PR UNLISTED PROCEDURE SMALL INTESTINE  01/23/2021    Procedure: UNLISTED PROCEDURE, SMALL INTESTINE;  Surgeon: Modena Nunnery, MD;  Location: [facility] PROCED[facility] [place] [facility];  Service: Gastroenterology    PR UNLISTED PROCEDURE SMALL INTESTINE  02/13/2022    Procedure: UNLISTED PROCEDURE, SMALL INTESTINE;  Surgeon: Bronson Curb, MD;  Location: [facility];  Service: Gastroenterology    PR UPPER GI ENDOSCOPY,BIOPSY N/A 10/27/2012    Procedure: UGI ENDOSCOPY; WITH BIOPSY, SINGLE OR MULTIPLE;  Surgeon: Shirlyn Goltz Mir, MD;  Location: PEDS PROCEDURE ROOM [facility];  Service: Gastroenterology    PR UPPER GI ENDOSCOPY,BIOPSY N/A 09/14/2013    Procedure: UGI ENDOSCOPY; WITH BIOPSY, SINGLE OR MULTIPLE;  Surgeon: Shirlyn Goltz Mir, MD;  Location: PEDS PROCEDURE ROOM [facility];  Service: Gastroenterology    PR UPPER GI ENDOSCOPY,BIOPSY N/A 11/08/2014    Procedure: UGI ENDOSCOPY; WITH BIOPSY, SINGLE OR MULTIPLE;  Surgeon: Arnold Long Mir, MD;  Location: PEDS PROCEDURE ROOM [facility];  Service: Gastroenterology PR UPPER GI ENDOSCOPY,BIOPSY N/A 12/26/2015    Procedure: UGI ENDOSCOPY; WITH BIOPSY, SINGLE OR MULTIPLE;  Surgeon: Arnold Long Mir, MD;  Location: PEDS PROCEDURE ROOM [facility];  Service: Gastroenterology    PR UPPER GI ENDOSCOPY,BIOPSY N/A 08/27/2016    Procedure: UGI ENDOSCOPY; WITH BIOPSY, SINGLE OR MULTIPLE;  Surgeon: Arnold Long Mir, MD;  Location: PEDS PROCEDURE ROOM [facility];  Service: Gastroenterology    PR UPPER GI ENDOSCOPY,BIOPSY N/A 09/02/2017    Procedure: UGI ENDOSCOPY; WITH BIOPSY, SINGLE OR MULTIPLE;  Surgeon: Arnold Long Mir, MD;  Location: PEDS PROCEDURE ROOM [facility];  Service: Gastroenterology    PR UPPER GI ENDOSCOPY,BIOPSY N/A 03/13/2020    Procedure: UGI ENDOSCOPY; WITH BIOPSY, SINGLE OR MULTIPLE;  Surgeon: Helyn Numbers, MD;  Location: [facility] [place] [facility];  Service: Gastroenterology    PR UPPER GI ENDOSCOPY,BIOPSY N/A 09/05/2021    Procedure: UGI ENDOSCOPY; WITH BIOPSY, SINGLE OR MULTIPLE;  Surgeon: Wendall Papa, MD;  Location: [facility];  Service: Gastroenterology    PR UPPER GI ENDOSCOPY,DIAGNOSIS N/A 01/20/2022    Procedure: UGI ENDO, INCLUDE ESOPHAGUS, STOMACH, & DUODENUM &/OR JEJUNUM; DX W/WO COLLECTION SPECIMN, BY BRUSH OR WASH;  Surgeon: Andrey Farmer, MD;  Location: [facility];  Service: Gastroenterology    TUMOR REMOVAL      multiple-head, neck, back, hand, right flank, multiple       Current Outpatient Medications   Medication Sig Dispense Refill    acetaminophen (TYLENOL) 500 MG tablet Take 2 tablets (1,000 mg total) by mouth every eight (8) hours. 30 tablet 0  ammonium lactate (LAC-HYDRIN) 12 % lotion Apply 1 Application topically two (2) times a day.      baclofen (LIORESAL) 5 mg Tab tablet Take 1-2 tablets (5-10 mg total) by mouth two (2) times a day as needed for muscle spasms. 40 tablet 0    buprenorphine 10 mcg/hour PTWK transdermal patch Place 1 patch on the skin every seven (7) days. (Patient not taking: Reported on 02/13/2022) 4 patch 2    cefdinir (OMNICEF) 300 MG capsule Take 1 capsule (300 mg total) by mouth daily.      clobetasoL (TEMOVATE) 0.05 % ointment Apply topically two (2) times a day. To stubborn/thick rashes on hands/feet ears until clear/smooth. Restart as needed 60 g 3    diclofenac sodium (VOLTAREN) 1 % gel Apply 2 g topically four (4) times a day. 100 g 0    diphenhydrAMINE (BENADRYL) 50 mg capsule Take 1 capsule (50 mg total) by mouth every six (6) hours as needed for itching (per patient preference (if prefers over hydroxyzine)). 30 each 0    famotidine (PEPCID) 20 MG tablet Take 1 tablet (20 mg total) by mouth nightly. 90 tablet 3    gabapentin (NEURONTIN) 100 MG capsule Take 2 capsules (200 mg total) by mouth nightly. 60 capsule 0    hyoscyamine (LEVSIN) 0.125 mg tablet Take 1 tablet (0.125 mg total) by mouth every four (4) hours as needed for cramping.      lidocaine (LIDODERM) 5 % patch Place 1 patch on the skin daily. Apply to affected area for 12 hours only each day (then remove patch)      menthol-zinc oxide (CALMOSEPTINE) 0.44-20.6 % Oint Apply topically four (4) times a day as needed. 113 g 11    mirtazapine (REMERON) 7.5 MG tablet Take 1 tablet (7.5 mg total) by mouth nightly. 90 tablet 3    naloxone (NARCAN) 4 mg nasal spray One spray in either nostril once for known/suspected opioid overdose. May repeat every 2-3 minutes in alternating nostril til EMS arrives 2 each 0    OLANZapine (ZYPREXA) 7.5 MG tablet TAKE 1 TABLET BY MOUTH EVERY NIGHTLY 90 tablet 1    ondansetron (ZOFRAN-ODT) 8 MG disintegrating tablet Take 1 tablet (8 mg total) by mouth every twelve (12) hours as needed for nausea.      oxyCODONE (ROXICODONE) 10 mg immediate release tablet Take 1 tablet (10 mg total) by mouth every four (4) hours as needed for pain. Fill on or after: 03/01/22 100 tablet 0    oxyCODONE (ROXICODONE) 5 MG immediate release tablet Take 1 tablet (5 mg total) by mouth every four (4) hours as needed for pain for up to 7 days. Take in combination with 10mg  tablet for a total dose 15mg  if needed for severe pain. 42 tablet 0    pimecrolimus (ELIDEL) 1 % cream Apply topically two (2) times a day. To face as needed for rash 30 g 11    silver sulfaDIAZINE (SILVADENE) 1 % cream Apply topically daily. To burn on leg 50 g 11    SLEEPY BUTTER Swish and spit 10 mL 4 times a day as needed. 300 mL 3    sodium/pot/mag/calc/chlor/acet (TPN ELECTROLYTES IV) Receiving IV TPN      triamcinolone (KENALOG) 0.1 % cream Apply topically two (2) times a day. 28 g 3    zinc oxide 10 % Crea Apply 1 application. topically daily as needed.       No current facility-administered medications for this visit.  Allergies:   Allergies   Allergen Reactions    Adhesive Tape-Silicones Hives and Rash     Paper tape  And tegederm ok    Ferrlecit [Sodium Ferric Gluconat-Sucrose] Swelling and Rash    Levofloxacin Swelling and Rash     Swelling in mouth, rash,     Methylnaltrexone      Per Patient: I lost bowel control, severe abdominal cramping, and elevated BP    Neomycin Swelling     Rxn after ear drops; ear swelling    Papaya Hives    Morphine Nausea And Vomiting    Zosyn [Piperacillin-Tazobactam] Itching and Rash     Red and itchy    Compazine [Prochlorperazine] Other (See Comments)     Extreme agitation    Latex, Natural Rubber Rash       Family History:  Cancer-related family history includes Cancer in her maternal grandmother. There is no history of Cancer or Melanoma.  She indicated that her mother is alive. She indicated that her father is alive. She indicated that her sister is alive. She indicated that her brother is alive. She indicated that the status of her maternal grandmother is unknown. She indicated that the status of her maternal grandfather is unknown. She indicated that the status of her paternal grandmother is unknown. She indicated that the status of her paternal grandfather is unknown. She indicated that the status of her maternal aunt is unknown. She indicated that the status of her maternal uncle is unknown. She indicated that the status of her paternal aunt is unknown. She indicated that the status of her paternal uncle is unknown. She indicated that the status of her neg hx is unknown.      REVIEW OF SYSTEMS:  A comprehensive review of 14 systems was negative except for pertinent positives noted in HPI.      PHYSICAL EXAM:   VS not reviewed in Epic, video visit  General: tired appearing, engaged  HEENT: NCAT, mild facial edema, thinning hair at hairline but covered with long hair, only visible when pulled back but quite apparent  Skin: no discoloration or rash apparent on video  Psych: constricted affect, not tearful today    Lab Results   Component Value Date    CREATININE 0.49 (L) 02/19/2022     Lab Results   Component Value Date    ALKPHOS 39 (L) 02/12/2022    BILITOT 0.3 02/12/2022    BILIDIR 0.20 12/04/2021    PROT 6.4 02/12/2022    ALBUMIN 3.4 02/12/2022    ALT 8 (L) 02/12/2022    AST 15 02/12/2022       The patient reports they are physically located in [place] and is currently: at home. I conducted a audio/video visit. I spent 30 minutes on the video call with the patient. I spent an additional 15 minutes on pre- and post-visit activities on the date of service.       [name], MD PhD   [facility]  [facility]

## 2022-02-26 ENCOUNTER — Ambulatory Visit: Admit: 2022-02-26 | Discharge: 2022-02-27 | Payer: PRIVATE HEALTH INSURANCE

## 2022-02-26 DIAGNOSIS — G894 Chronic pain syndrome: Principal | ICD-10-CM

## 2022-02-26 DIAGNOSIS — R1084 Generalized abdominal pain: Principal | ICD-10-CM

## 2022-02-26 DIAGNOSIS — M7918 Myalgia, other site: Principal | ICD-10-CM

## 2022-02-26 DIAGNOSIS — R1030 Lower abdominal pain, unspecified: Principal | ICD-10-CM

## 2022-02-26 DIAGNOSIS — Q8789 Other specified congenital malformation syndromes, not elsewhere classified: Principal | ICD-10-CM

## 2022-02-26 DIAGNOSIS — D48119 Desmoid tumor of unspecified site: Secondary | ICD-10-CM | POA: Diagnosis not present

## 2022-02-26 DIAGNOSIS — R112 Nausea with vomiting, unspecified: Secondary | ICD-10-CM | POA: Diagnosis not present

## 2022-02-26 DIAGNOSIS — Z932 Ileostomy status: Secondary | ICD-10-CM | POA: Diagnosis not present

## 2022-02-26 DIAGNOSIS — E860 Dehydration: Secondary | ICD-10-CM | POA: Diagnosis not present

## 2022-02-26 MED ORDER — OXYCODONE 10 MG TABLET
ORAL_TABLET | ORAL | 0 refills | 24 days | Status: CP | PRN
Start: 2022-02-26 — End: ?

## 2022-02-26 MED ORDER — BACLOFEN 5 MG TABLET
ORAL_TABLET | Freq: Two times a day (BID) | ORAL | 0 refills | 10 days | Status: CP | PRN
Start: 2022-02-26 — End: ?

## 2022-02-26 NOTE — Unmapped (Signed)
Medication:OXYCODONE IR 10 MG  ZOX:WRUE 1 Q 4 HRS PRN FOR PAIN   Quantity on RX: #180  Filled on:01/01/2022  Pill count today: #33

## 2022-02-26 NOTE — Unmapped (Signed)
It was good to see you today.    Take 1 oxycodone tablet every 4 hours as needed. Take an additional 1/2 tablet up to twice daily as needed for breakthrough pain. Continue to try to decrease your oxycodone use as tolerated over the next several weeks.     You can take Baclofen with oxycodone to see if this provides better relief. Be cautious with both medications as they can lead to drowsiness.    We will try Butrans once you are able to decrease to 10 mg of oxycodone twice daily or less.    Please let me know if you have issues with your medications and we need to make adjustments before next visit.

## 2022-02-26 NOTE — Unmapped (Signed)
Chronic Pain Follow Up Note      Assessment and Plan    1. Diffuse myofascial pain syndrome    2. Chronic pain syndrome    3. Intermittent lower abdominal pain    4. Generalized abdominal pain    5. Desmoid fibromatosis    6. [name]      [name] [name] is a 21 y.o. female with past medical history significant for DVT (RUE, s/p Eliquis x3 months), [name] syndrome s/p colectomy, desmoid tumor, anemia, and severe protein-calorie malnutrition who is being followed at [facility] clinic for abdominal and right shoulder pain that is consistent with the diagnosis of cancer associated pain.    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 Pt has continued and now chronic pain related to her tumors, has R shoulder pain as well. Follows with [facility] Dr [name] as well as by [facility] oncology palliative care (Dr [name]) for pall care sx and pain mng.    Chronic abdominal pain  Chronic, not at goal. Had recent hospitalization for severe flares and GI bleed. She is no longer taking Eliquis. Urine tox screen and opioid agreement current and appropriate. Reviewed labs from 01/29/22, CMP shows normal Creatinine at 0.36, normal AST at 16, and normal ALT at 13. Today, patient reports that she is overall worse due to recent hospitalization. She is currently taking oxycodone 10 mg q4h prn in conjunction with oxycodone 5 mg TID-QID prn from hospital discharge. We had long discussion about weaning oxycodone safely and starting Butrans patch when she was ready and pain was adequately under control. Discussed continuing to wean oxycodone as tolerated and once she is stabile at 2-3 tabs/day we could start Butrans 10 mcg/hr patch. Patient will reach out to our clinic if she needs any medication adjustments prior to next visit. She is hopeful to return to driving, though we encourage she take her time in controlling her pain before making switch to Butrans abruptly.   - Continue Gabapentin 200 mg at bedtime  - Continue Baclofen 5mg  as needed, not currently taking though going to try instead of hyoscyamine in hopes of further analgesia; refilled    - Will start Butrans patch at 10 mcg/hr after able to wean patient from current oxy regimen, informed patient to wean to #2-3 tabs of oxycodone per day for 2 days prior to initiation of Butrans and then wean completely off oxycodone over the subsequent days  - Continue Oxycodone 10 mg Q4H prn + 1/2 tablet as needed  #210 tablets following hospitalization   - Can adjust regimen before next visit if necessary    [name] of skin; [name]; Cancer associated pain; Right shoulder pain, unspecified chronicity; allodynia; diffuse myofacial pain; chronic pain syndrome  Chronic, not complaint today; not at goal and secondary pain generator.  - Continue Voltaren 1% gel.   - Repeat TPIs prn  - Rotation from oxycodone to Butrans as above  - Continue PT  - Encouraged slow resumption of normal activity and times when pain has decreased  - Encouraged patient to follow-up with PCP regarding her concerns about possible malnutrition    Medication Monitoring  [place] PDMP was reviewed today and it was appropriate.  Patient brought medications to clinic today for pill count. Counts were appropriate.  Last urine toxicology screen: 12/04/21  Treatment agreement last reviewed and signed: 12/04/21  Aberrancy with treatment plan?  no  Opioid Risk: low  Pain psychology: no  Benzodiazepines: no  Naloxone: N/A  EKG: NSR with normal Qtc (422 on 09/04/21)  MME: ~120    I have reviewed the [facility] Medical Board statement on use of controlled substances for the treatment of pain as well as the CDC Guideline for Prescribing Opioids for Chronic Pain. I have reviewed the  Controlled Substance Monitoring Database.    Return for Next scheduled follow up.    PLAN:  Today I have prescribed:  Requested Prescriptions     Signed Prescriptions Disp Refills    baclofen (LIORESAL) 5 mg Tab tablet 40 tablet 0     Sig: Take 1-2 tablets (5-10 mg total) by mouth two (2) times a day as needed for muscle spasms.    oxyCODONE (ROXICODONE) 10 mg immediate release tablet 210 tablet 0     Sig: Take 1-1.5 tablets (10-15 mg total) by mouth every four (4) hours as needed for pain. Fill on or after: 02/26/22     No orders of the defined types were placed in this encounter.     HPI  [name] [name] is a 21 y.o. being followed at [facility] for complaint of chronic pain localized to her shoulders and abdomen.      Patient was last seen in November, at which point she had 2 prolonged hospitalizations for severe flares and s/p celiac plexus block performed to address the first flare but caused a post-procedural pain flare resulting in a second admission for pain control. However, her abdominal pain appeared to be significantly better now that the postprocedural pain flare has abated, the celiac plexus block appeared to be taking, and after receiving the maximum of 7 days of a ketamine infusion while admitted.  Additionally, during the admission from 11/3-11/15 for intractable abdominal pain her primary team had concern for GIB 2/2 possibly a solitary ulcer at anastomotic site, something that might have been made worse by a short course of Toradol use to treat her postoperative pain flare.  She was also now on Eliquis given concern for clotting outweighing the concern for ongoing GIB. Urine tox screen and opioid agreement current and appropriate. Reviewed labs from 01/29/22, CMP showed normal Creatinine at 0.36, normal AST at 16, and normal ALT at 13. Patient reported that she was overall improving in symptoms. Some days were worse than others and she was sometimes able to only take 1-2 pills of oxycodone a day but some days had to take it every 4 hours. Patient had not been taking baclofen or dilaudid without worsening of pain. Discussed continuing to wean oxycodone as tolerated and once she was stability at 2-3 tabs/day counseled to start Butrans 10 mcg/h patch. Her R shoulder pain appeared to be primarily nociceptive with a strong nociplastic component manifesting as allodynia and hyperalgesia.  Patient had endorsed significant improvement in right shoulder function following previous TPI and patient's hyperalgesia had improved following prior TPI.  We continued to perform trigger point injections given their efficacy in controlling the myofascial component of her overall pain but avoid dry needling and Toradol due to current Eliquis use.      Since last visit, the patient has followed with Derm, GI, and Hem Onc. She was hospitalized for GI bleed and underwent endoscopic eval of small intestine with GI on 02/13/22.     Today, she reports overall worsened pain following recent hospitalization. She reports prior to hospitalization she was doing well and considering Butrans patch  for controlled release pain coverage. The patient inquires about being able to drive on these medications. Her mom reports she is currently recovering from a GI bleed. She and her mother had questions about coming off of oxycodone and hopefully starting Butrans patch. Her mother reports her outside providers have concerns about her bowel motility and possible intermittent bowel obstructions that are causing her pain. She reports ongoing TPN use. The patient reports pain is worse with activity.     In terms of medications, the patient reports taking oxycodone 10 mg q4h prn in conjunction with oxycodone 5 mg prn following hospitalization. She reports on average taking the additional oxycodone 5 mg TID-QID with benefit. She states she has not been able to start on the Butrans patch due to being in the hospital. The patient reports she has stopped Eliquis. Her mom states that dilaudid has historically worked the best for pain control, though PO dilaudid causes GI upset and worsened motility. The patient denies being constipated at this time. They both agree that as long as she stays off of hyoscyamine while taking opioids, her bowels are better controlled. She inquires about taking baclofen for myofascial pain to hopefully allow her to decrease oxycodone use. The patient states she needs her pain medication more than the hyoscyamine recently.     Current analgesic regimen:  - Gabapentin 200 mg bedtime  - Baclofen 5 mg prn  - Oxycodone 10mg  q4 PRN  - Oxycodone 5mg  q4h prn  - Tylenol PRN  - Voltaren 1% gel    The patient states her pain is located right shoulder and abdominal and the severity of her pain ranges from 4/10 to 10/10.  Her pain currently is 6/10 and on average is 6/10.  She describes the sensation of her pain as nauseating, pulling, sharp, tender, throbbing. Her pain is present all of the time and worst during the day. The patient???s pain impacts enjoyment of life, general activity, mood, normal work, recreational activities, sleep, walking, standing. Her interval history includes hospitalization, ER visit. Her pain is worse, and she does not have new pain to discuss today. She is not on blood thinners or anti-coagulants. In regards to medications currently taken for pain management, the patient is tolerating these medications well and complains of associated side effects: None.    Previous Medication Trials:  NSAIDS: Toradol (c/f GIB after)  Antidepressant/anxiety: Cymbalta (stopped due to mood side effects), amitriptyline (insomnia and increased J-pouch output)  Anticonvulsants: Gabapentin  Muscle relaxants: Robaxin  Topicals: lidocaine, coltaren gel  Short-acting opiates: oxycodone, PO HM  Long-acting opiates:   Other: Tylenol    Previous Interventions  Procedures:   - TPIs in August 2023 with initial pain flare, subsequent improvement, and near immediate relief flare of pain; hyperalgesic with procedure  - TPIs of Right trapezius, rhomboid, infraspinatus on 12/04/2021.  - TPIs on 10/25  - Celiac plexus block on 11/1 (severe post-procedural pain flare requiring admission for pain control, significant hyperalgesia)  - TPIs 11/22    PT: Yes    Allergies  Allergies   Allergen Reactions    Adhesive Tape-Silicones Hives and Rash     Paper tape  And tegederm ok    Ferrlecit [Sodium Ferric Gluconat-Sucrose] Swelling and Rash    Levofloxacin Swelling and Rash     Swelling in mouth, rash,     Methylnaltrexone      Per Patient: I lost bowel control, severe abdominal cramping, and elevated BP    Neomycin Swelling  Rxn after ear drops; ear swelling    Papaya Hives    Morphine Nausea And Vomiting    Zosyn [Piperacillin-Tazobactam] Itching and Rash     Red and itchy    Compazine [Prochlorperazine] Other (See Comments)     Extreme agitation    Latex, Natural Rubber Rash       Home Medications    Current Outpatient Medications   Medication Sig Dispense Refill    acetaminophen (TYLENOL) 500 MG tablet Take 2 tablets (1,000 mg total) by mouth every eight (8) hours. 30 tablet 0    ammonium lactate (LAC-HYDRIN) 12 % lotion Apply 1 Application topically two (2) times a day.      buprenorphine 10 mcg/hour PTWK transdermal patch Place 1 patch on the skin every seven (7) days. 4 patch 2    cefdinir (OMNICEF) 300 MG capsule Take 1 capsule (300 mg total) by mouth daily.      clobetasoL (TEMOVATE) 0.05 % ointment Apply topically two (2) times a day. To stubborn/thick rashes on hands/feet ears until clear/smooth. Restart as needed 60 g 3    diclofenac sodium (VOLTAREN) 1 % gel Apply 2 g topically four (4) times a day. 100 g 0    diphenhydrAMINE (BENADRYL) 50 mg capsule Take 1 capsule (50 mg total) by mouth every six (6) hours as needed for itching (per patient preference (if prefers over hydroxyzine)). 30 each 0    famotidine (PEPCID) 20 MG tablet Take 1 tablet (20 mg total) by mouth nightly. 90 tablet 3    hyoscyamine (LEVSIN) 0.125 mg tablet Take 1 tablet (0.125 mg total) by mouth every four (4) hours as needed for cramping.      lidocaine (LIDODERM) 5 % patch Place 1 patch on the skin daily. Apply to affected area for 12 hours only each day (then remove patch)      menthol-zinc oxide (CALMOSEPTINE) 0.44-20.6 % Oint Apply topically four (4) times a day as needed. 113 g 11    mirtazapine (REMERON) 7.5 MG tablet Take 1 tablet (7.5 mg total) by mouth nightly. 90 tablet 3    naloxone (NARCAN) 4 mg nasal spray One spray in either nostril once for known/suspected opioid overdose. May repeat every 2-3 minutes in alternating nostril til EMS arrives 2 each 0    OLANZapine (ZYPREXA) 7.5 MG tablet TAKE 1 TABLET BY MOUTH EVERY NIGHTLY 90 tablet 1    ondansetron (ZOFRAN-ODT) 8 MG disintegrating tablet Take 1 tablet (8 mg total) by mouth every twelve (12) hours as needed for nausea.      pimecrolimus (ELIDEL) 1 % cream Apply topically two (2) times a day. To face as needed for rash 30 g 11    silver sulfaDIAZINE (SILVADENE) 1 % cream Apply topically daily. To burn on leg 50 g 11    SLEEPY BUTTER Swish and spit 10 mL 4 times a day as needed. 300 mL 3    sodium/pot/mag/calc/chlor/acet (TPN ELECTROLYTES IV) Receiving IV TPN      triamcinolone (KENALOG) 0.1 % cream Apply topically two (2) times a day. 28 g 3    zinc oxide 10 % Crea Apply 1 application. topically daily as needed.      baclofen (LIORESAL) 5 mg Tab tablet Take 1-2 tablets (5-10 mg total) by mouth two (2) times a day as needed for muscle spasms. 40 tablet 0    gabapentin (NEURONTIN) 100 MG capsule Take 2 capsules (200 mg total) by mouth nightly. 60 capsule 0    oxyCODONE (  ROXICODONE) 10 mg immediate release tablet Take 1-1.5 tablets (10-15 mg total) by mouth every four (4) hours as needed for pain. Fill on or after: 02/26/22 210 tablet 0     No current facility-administered medications for this visit.       ROS  General weight loss  Cardiovascular none  Gastrointestinal nausea  Skin none  Endocrine none  Musculoskeletal none  Neurologic none  Psychiatric none    Physical Exam    VITALS:   Vitals:    02/26/22 1007   BP: 113/75   Pulse: 88   Temp: 36.6 ??C (97.9 ??F)   SpO2: 99%       Wt Readings from Last 3 Encounters:   02/26/22 59.5 kg (131 lb 1.6 oz)   02/19/22 58.9 kg (129 lb 14.4 oz)   02/06/22 55.8 kg (123 lb)     GENERAL:  The patient is well developed, well-nourished, and appears to be in no apparent distress. TPN infusing.  HEAD/NECK:    Normocephalic/atraumatic. clear sclera, pupils not pinpoint  CV:  Warm and well perfused  LUNGS:   Normal work of breathing, no supplemental O2  EXTREMITIES:  No clubbing, cyanosis noted.  NEUROLOGIC:    The patient is alert and oriented, speech fluent, normal language.   MUSCULOSKELETAL:    Motor function  preserved.   GAIT:  The patient rises from a seated position with no difficulty and ambulates with nonantalgic gait without the assistance of a walking aid.   SKIN:   No obvious rashes, lesions, or erythema.  PSY:   Appropriate affect. No overt pain behaviors. No evidence of psychomotor retardation or agitation, no signs of intoxication.     Documentation assistance was provided by Waldemar Dickens Scribe, on February 26, 2022 at 10:17 AM for Galen Daft.    I personally spent 46 minutes face-to-face and non-face-to-face in the care of this patient, which includes all pre, intra, and post visit time on the date of service.  All documented time was specific to the E/M visit and does not include any procedures that may have been performed.  February 26, 2022 12:46 PM. Documentation assistance provided by the Scribe. I was present during the time the encounter was recorded. The information recorded by the Scribe was done at my direction and has been reviewed and validated by me. Rhina Brackett, FNP-C

## 2022-02-27 DIAGNOSIS — Z932 Ileostomy status: Secondary | ICD-10-CM | POA: Diagnosis not present

## 2022-02-27 DIAGNOSIS — R112 Nausea with vomiting, unspecified: Secondary | ICD-10-CM | POA: Diagnosis not present

## 2022-02-27 DIAGNOSIS — R1084 Generalized abdominal pain: Secondary | ICD-10-CM | POA: Diagnosis not present

## 2022-02-27 DIAGNOSIS — E860 Dehydration: Secondary | ICD-10-CM | POA: Diagnosis not present

## 2022-02-27 NOTE — Unmapped (Signed)
Oncology Massage Therapy Note     Name: [name] [name]  MRN: [id number]  Date: 02/27/2022          None     Mask     Observations    Patient appeared calm and relaxed during the massage session  Patient expresses relief from pain and reports a decrease in pain intensity          Patient Evaluation  Before Massage: Patient [name] [name] came in today for a 30 minute massage with focus on R side trapezius, rhomboids and winged scapula. Tightness and sore to the touch, very gentle massage. Patient lied prone for 10 minutes and supine for 10 minutes with focus on c2-c5.     After Massage: Patient appeared relaxed and expressed she felt a decrease in pain on the R side. Return visit.

## 2022-02-27 NOTE — Unmapped (Unsigned)
[facility] BONE AND SOFT TISSUE ONCOLOGY RETURN VISIT    Encounter Date: 02/28/2022  Patient Name: [name] [name]  Medical Record Number: [id number]    Referring Physician: Referring, None Per Patient  [address] [place],  [place] [address]    Primary Care Provider: Jarold [name], [facility]    DIAGNOSIS:  No diagnosis found.      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    Desmoid fibromatosis, history of [name]     She has had numerous sites of disease, including paraspinal, chest wall, right arm/wrist. She has had several cryoablations with [name] [name] which have seemed to offer clinical benefit. She trialed sorafenib which seemed to lead to shrinkage of lesions, but she had significant intolerance with dizziness, presyncope, fatigue, hair loss, rash, GI side effects.      We have discussed at length the nature of desmoid fibromatosis tumors, the fact they can wax and wane independent of therapy, are not cancers in a metastatic potential, but can be locally infiltrative and cause substantial morbidity to the site of disease and occasionally mortality. I reviewed that the emerging standard of care is observation based on the fact that 20-30% will experience spontaneous regression in the year following diagnosis. Given her long experience with desmoids, complete regression may be less likely, but we will still approach this cautiously with parallel goals of avoidance of overtreatment (particularly surgical) and palliation of symptoms / preservation of function.     Current lesions are in the shoulder, right forearm, chest area, with a possible desmoid in her left thigh. Symptoms are manageable at the moment, after recent cryoablations and steroid injection to the forearm.     - Her poor tolerance of sorafenib was likely multifactorial: simultaneous COVID infection and difficulty with good supportive care given that she was in a busy college semester several hours away from her medical team. Would not rule out trying this again in the future with optimized supportive care  - Alternatively, pazopanib would be a reasonable option, per DESMOPAZ (82% nonprogression at 6 mos, similar RR to sorafenib)  - clinical trials, novel or off-label therapies may be reasonable if available: potential agents include DFMO, gamma secretase inhibitors, NOTCH pathway inhibitors    11/09/20: MRI without evidence of recurrent desmoids, stability of prior lesions.  02/14/21: Biopsy of thigh muscle thickening - benign, hypocellular fibrous tissue. Favor Gardner fibroma - on the desmoid spectrum but less cellular and less aggressive   02/14/21: Percutaneous steroid injection of forearm lesions.    03/19/21: Lots of active issues, although desmoids are not one of them. On TPN for diarrhea, vomiting, intolerance of food, significant malnutrition. Having to withdraw partially from school given medical issues.     06/21/21: Has had a complex few months, on TPN, significant fluid overload, caused worsening desmoid pain. Now off TPN, nutrition slowly improving, tumor pain better. Scans show 2 ill defined soft tissue nodules, concerning for recurrent desmoid. Start with surveillance, given risks of systemic therapy after recent significant GI and medical issues.     09/03/21: Scans show stable disease. Was admitted for abdominal pain, concern for intussusception, N/V, but we discussed that these may not be related to her desmoids. Opted for continued surveillance.    12/03/21: Scans show slow growth when reflecting over the past several months. Importantly, she reports substantially worsening abdominal pain which is centered on her desmoids, poor appetite, weight loss, possible transient obstructive symptoms. Long conversation  about options and pros and cons of each. She would like to trial sorafenib again, acknowledging the risks. She will meet with GI, her psychiatry team (she has had challenges with duloxetine), pain team, and may reconnect with [name] [name] in the interim to discuss other supportive care measures and procedural options.     9/28-10/13/23: Hospitalization for abdominal pain, malnutrition. Pouchoscopy 10/4 without stricture. Initiated on tube feeds. Sorafenib was initiated ~ 10/3 and tolerated reasonably well in house.    12/31/21: Tox check. Sorafenib 200mg  daily, but a variety of ongoing issues. Diarrhea (which may be drug related), ongoing abdominal pain (perhaps partially desmoid related but not exclusively), intolerance of PO diet and even tube feeds (driven by pain / cramping), rash (drug related), anxiety. Will address several factors, detailed below in the plan.    01/10/22-01/22/22: Since last visit, had 2-week hospitalization, due to uncontrolled plain in the setting of recent celiac plexus block, complicated by GI bleeding in setting of anticoagulation due to line associated DVT. She subsequently required multiple iron infusions, and was discharged on eliquis 2.5mg  BID.  During the hospitalization was noted to be persistently tachycardic, likely multifactorial etiology.  Additionally during her hospital admission was restarted on TPN in consultation with her GI team.  Patient was discharged on 01/22/2022 on TPN.      02/18/22: ***    Anxiety  AYA Needs  Patient is 21 and in the past year has had the collision of numerous medical challenges which have pushed her to see her health and future medical care more clearly. Trying to balance all of her medical issues this with future goals of career, nursing, college. Significant burden of anxiety, maybe depression as well. Was reportedly diagnosed with medical trauma by the counseling office at her university. Also trying to establish her own autonomy after a medicalized childhood  - connected with AYA team, [name] [name]. May need input from [facility] or others at some point  - seen by AYA palliative care sarcoma collaborative, [name] [name], will assist with pain management and symptom control, as well as grappling with current issues relating to cancer, AYA, development    Tachycardia  Patient with persistent sinus tachycardia, usually around 100 bpm.  This is in the setting of anxiety and abdominal pain.  Etiology likely multifactorial as she is also unable to take good p.o., currently on TPN.  Discussed potential interactions of sorafenib, do not feel that this is significantly contributing to her tachycardia.  Plan to continue work with subspecialists to control her anxiety as well as taking steps with the pain management team to have her pain better managed.  Will give 1 L IV fluids today and continue to encourage appropriate p.o. intake.     Tumor pain: seen by AYA sarcoma palliative care, [name] [name], as well as pain team - [name] has been working closely with her  -defer to pain team     Contraception: Not reviewed today, will need to confirm prior to initiation of TKI  -placed fertility referral today due to her interest in discussing options with them, especially prior to possible use of nirogacestat which has a risk of amenorrhea / ovarian dysfunction    Line Associated DVT:   Duplex demonstrates DVT shortly after line placement. Treated with Apixaban 5mg  BID x 3 mos, now completed    Plan:  -TPN per GI  -continue pain management with pain service  -continue sorafenib at 200mg  daily (goal is eventually 400mg  daily) versus  consideration for nirogacestat  -RTC 1 month for tox check    I personally reviewed the medical records, pathology and laboratory results and viewed the imaging. All questions were answered to the patient's apparent satisfaction and they voiced understanding and agreement with the plan. Pt has my card and contact information and is encouraged to reach out with any further questions.    Donzetta Sprung, MD/MPH  Assistant Professor  Bone and Soft Tissue Oncology Program  [facility]  Pager: [phone]  Nurse Navigator: Roseanne Reno RN, BSN, OCN      REASON FOR CONSULTATION:   [name] [name] is a 21 y.o. female who is seen in consultation at the request of Referring, None Per Dennie Bible* for evaluation of her desmoid fibromatosis    HISTORY OF PRESENT ILLNESS:      Attends school at [employer] in [place], [place]      Right arm desmoid is the most bothersome - significant arm cramping, deep aching pain, lasts for ~30 min after cramping starts. Has noticed right hand shaking frequently. 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     Left chest lesion used to be quite uncomfortable, caused swelling in her arm and discomfort. Improved after cryoablation.     Right shoulder blade hurts when she leans back against it, fairly painful. Right chest wall lesion has some associated pain, improving since cryo.     First diagnosed at 21 years old - seen in [place] at first, had numerous cysts. First major issues were two paraspinal lesions, referred to [facility] for biopsy and ultimately resection.  Soon diagnosed with [name], seen by medical genetics early on.  Guided by Dr. Debbe Mounts, referred out to proceduralists as needed.  2011 - had a growing lesion on her back as well as bilateral neck lesions, resected at [facility], found to be desmoid  02/2012 - surgery for thoracic spine desmoid. Maybe another flank lesion removed  2017 - wrist lesion treated by VIR with bupivicaine, kenalog  08/2016 - endoscopy with adenomatous polyp of the colon, non-dysplastic  02/2017 - subcutaneous lesion resection by plastic surgery (postauricular and RUE, RLE) - path revealed epidermal inclusion cysts, osteomas  05/2018: cryotherapy of left anterior chest wall lesion  05/2018: started sulindac but struggled with adherence due to GI side effects  08/2018: cryo #2 to chest wall lesion  12/10/2018: started sorafenib due to progression in left paraspinal lesion, ?posterior chest wall lesion  02/2019: discontinued sorafenib for several reasons: fatigue, dizziness, nausea, hair loss, as well as COVID infection in late November. Derm consulted for rash / hair loss, started on antihistamines, topical steroids. Trialed lower dose but ultimately stopped again 05/2019 due to toxicity.  06/2019: follow up with surgical oncology - plans for observation rather than repeat surgery  07/2019: cryoablation of anterior chest wall desmoid  07/2019: excision of subcutaneous LE mass and L neck masses, clinically appeared consistent with cysts  08/2019: cryoablation of R posterior chest wall, left paraspinal desmoid     Interval History:  ***    REVIEW OF SYSTEMS:  A comprehensive review of 12 systems was negative except for pertinent positives noted in HPI.    Past Medical History, Surgical History, Family History were reviewed personally. Any changes were updated as above.    Social History:  Attended [employer] in [place], has been interrupted by health issues  Wants to be a Engineer, civil (consulting) in pediatric hematology/oncology   Mother Natalia Leatherwood is a strong advocate, often present  at visits    Pregnancy status: premenopausal, interested in future children    ALLERGIES/MEDICATIONS:  Reviewed in Epic    PHYSICAL EXAM:   LMP 01/15/2022 (Exact Date)   ECOG 1  Gen - well appearing, in no acute distress, mother accompanying her   Eyes - conjunctivae clear, PERRL  ENT -no scleral icterus, moist mucous membranes  Lymph - no cervical or supraclavicular lymphadenopathy appreciated  Resp - clear to auscultation bilaterally, no wheezes, crackles or rales  CV - normal rate, regular rhythm, no murmur appreciated, no lower extremity edema noted  GI - abdomen slightly firm, diffusely tender, prior surgical scars appear well-healed, no masses, bowel sounds normal  Skin - no rashes, no lesions noted  Neuro - AOx3, EOM intact, no facial droop, speech fluent and coherent, moves all extremities without asymmetry  Psych - affect normal, mood okay  MSK - no joint tenderness or swelling    PATHOLOGY:   Pathology was personally reviewed as described in the HPI, detailed in Epic.     LABS:  Reviewed in Epic    RADIOLOGY:  Imaging was personally viewed and interpreted as summarized in the history (see above).

## 2022-02-28 ENCOUNTER — Ambulatory Visit: Admit: 2022-02-28 | Discharge: 2022-02-28 | Payer: PRIVATE HEALTH INSURANCE

## 2022-02-28 ENCOUNTER — Other Ambulatory Visit: Admit: 2022-02-28 | Discharge: 2022-02-28 | Payer: PRIVATE HEALTH INSURANCE

## 2022-02-28 DIAGNOSIS — E43 Unspecified severe protein-calorie malnutrition: Principal | ICD-10-CM

## 2022-02-28 DIAGNOSIS — D48119 Desmoid tumor of unspecified site: Secondary | ICD-10-CM | POA: Diagnosis not present

## 2022-02-28 DIAGNOSIS — K625 Hemorrhage of anus and rectum: Principal | ICD-10-CM

## 2022-02-28 DIAGNOSIS — F411 Generalized anxiety disorder: Principal | ICD-10-CM

## 2022-02-28 DIAGNOSIS — F410 Panic disorder [episodic paroxysmal anxiety] without agoraphobia: Principal | ICD-10-CM

## 2022-02-28 DIAGNOSIS — I82A11 Acute embolism and thrombosis of right axillary vein: Principal | ICD-10-CM

## 2022-02-28 DIAGNOSIS — R197 Diarrhea, unspecified: Secondary | ICD-10-CM | POA: Diagnosis not present

## 2022-02-28 DIAGNOSIS — R112 Nausea with vomiting, unspecified: Secondary | ICD-10-CM | POA: Diagnosis not present

## 2022-02-28 DIAGNOSIS — Z86718 Personal history of other venous thrombosis and embolism: Secondary | ICD-10-CM | POA: Diagnosis not present

## 2022-02-28 DIAGNOSIS — Z932 Ileostomy status: Secondary | ICD-10-CM | POA: Diagnosis not present

## 2022-02-28 DIAGNOSIS — E860 Dehydration: Secondary | ICD-10-CM | POA: Diagnosis not present

## 2022-02-28 DIAGNOSIS — F419 Anxiety disorder, unspecified: Secondary | ICD-10-CM | POA: Diagnosis not present

## 2022-02-28 DIAGNOSIS — R1084 Generalized abdominal pain: Secondary | ICD-10-CM | POA: Diagnosis not present

## 2022-02-28 DIAGNOSIS — Z7901 Long term (current) use of anticoagulants: Secondary | ICD-10-CM | POA: Diagnosis not present

## 2022-02-28 LAB — T4, FREE: FREE T4: 0.93 ng/dL (ref 0.89–1.76)

## 2022-02-28 LAB — MAGNESIUM: MAGNESIUM: 2.1 mg/dL (ref 1.6–2.6)

## 2022-02-28 LAB — PHOSPHORUS: PHOSPHORUS: 4.4 mg/dL (ref 2.4–5.1)

## 2022-02-28 LAB — TSH: THYROID STIMULATING HORMONE: 1.381 u[IU]/mL (ref 0.550–4.780)

## 2022-02-28 MED ORDER — LIDOCAINE HCL 2 % MUCOSAL SOLUTION
OROMUCOSAL | 3 refills | 1 days | Status: CP | PRN
Start: 2022-02-28 — End: ?

## 2022-02-28 NOTE — Unmapped (Signed)
[employer] denied PA for oxycodone 10 mg tablets at current dosing exceeding quantity limit. Insurance allows qty #6 daily for paid claim. Current dosing instruction. Take 1-1.5 tablets (10-15 mg total) by mouth every four (4) hours as needed for pain. Previous Rx oxycodone 5 mg. Last OV 02/26/2022. Next appt 04/01/2021. Pls advise.

## 2022-03-01 DIAGNOSIS — R1084 Generalized abdominal pain: Secondary | ICD-10-CM | POA: Diagnosis not present

## 2022-03-01 DIAGNOSIS — E860 Dehydration: Secondary | ICD-10-CM | POA: Diagnosis not present

## 2022-03-01 DIAGNOSIS — R112 Nausea with vomiting, unspecified: Secondary | ICD-10-CM | POA: Diagnosis not present

## 2022-03-01 DIAGNOSIS — Z932 Ileostomy status: Secondary | ICD-10-CM | POA: Diagnosis not present

## 2022-03-02 DIAGNOSIS — Z932 Ileostomy status: Secondary | ICD-10-CM | POA: Diagnosis not present

## 2022-03-02 DIAGNOSIS — R112 Nausea with vomiting, unspecified: Secondary | ICD-10-CM | POA: Diagnosis not present

## 2022-03-02 DIAGNOSIS — E860 Dehydration: Secondary | ICD-10-CM | POA: Diagnosis not present

## 2022-03-02 DIAGNOSIS — R1084 Generalized abdominal pain: Secondary | ICD-10-CM | POA: Diagnosis not present

## 2022-03-03 DIAGNOSIS — Z932 Ileostomy status: Secondary | ICD-10-CM | POA: Diagnosis not present

## 2022-03-03 DIAGNOSIS — R1084 Generalized abdominal pain: Secondary | ICD-10-CM | POA: Diagnosis not present

## 2022-03-03 DIAGNOSIS — E860 Dehydration: Secondary | ICD-10-CM | POA: Diagnosis not present

## 2022-03-03 DIAGNOSIS — R112 Nausea with vomiting, unspecified: Secondary | ICD-10-CM | POA: Diagnosis not present

## 2022-03-04 DIAGNOSIS — E860 Dehydration: Secondary | ICD-10-CM | POA: Diagnosis not present

## 2022-03-04 DIAGNOSIS — R112 Nausea with vomiting, unspecified: Secondary | ICD-10-CM | POA: Diagnosis not present

## 2022-03-04 DIAGNOSIS — Z932 Ileostomy status: Secondary | ICD-10-CM | POA: Diagnosis not present

## 2022-03-04 DIAGNOSIS — R1084 Generalized abdominal pain: Secondary | ICD-10-CM | POA: Diagnosis not present

## 2022-03-04 NOTE — Unmapped (Signed)
Again rec'd the denial for more than 6 oxycodone tabs daily.  Faxed clinic notes supporting the quantity of 9 tabs to [employer] [facility] [employer] [facility] for an urgent appeal.

## 2022-03-04 NOTE — Unmapped (Signed)
Pt's mom called.  Unable to fill the #210 oxycodone.  [facility] an unfilled [facility]100 oxycodone 10 mg RX they are able to fill with a paid claim.  [facility]

## 2022-03-04 NOTE — Unmapped (Signed)
Submitted a new urgent PA for the quantity exception for the oxycodone 10 mgKASSIDY Signore (Key: [id number])    Your information has been submitted to [facility]. [employer] [place] will review the request and notify you of the determination decision directly, typically within 72 hours of receiving all information.  You will also receive your request decision electronically. To check for an update later, open this request again from your dashboard.  If [employer] has not responded within the specified timeframe or if you have any questions about your PA submission, contact [employer] [place] directly at [phone].

## 2022-03-05 DIAGNOSIS — E860 Dehydration: Secondary | ICD-10-CM | POA: Diagnosis not present

## 2022-03-05 DIAGNOSIS — R112 Nausea with vomiting, unspecified: Secondary | ICD-10-CM | POA: Diagnosis not present

## 2022-03-05 DIAGNOSIS — Z932 Ileostomy status: Secondary | ICD-10-CM | POA: Diagnosis not present

## 2022-03-05 DIAGNOSIS — R1084 Generalized abdominal pain: Secondary | ICD-10-CM | POA: Diagnosis not present

## 2022-03-06 DIAGNOSIS — Z932 Ileostomy status: Secondary | ICD-10-CM | POA: Diagnosis not present

## 2022-03-06 DIAGNOSIS — E860 Dehydration: Secondary | ICD-10-CM | POA: Diagnosis not present

## 2022-03-06 DIAGNOSIS — R1084 Generalized abdominal pain: Secondary | ICD-10-CM | POA: Diagnosis not present

## 2022-03-06 DIAGNOSIS — R112 Nausea with vomiting, unspecified: Secondary | ICD-10-CM | POA: Diagnosis not present

## 2022-03-06 NOTE — Unmapped (Signed)
TPN orders changed from 7days to 5days per Dr. [name], order in media tab.

## 2022-03-07 DIAGNOSIS — R112 Nausea with vomiting, unspecified: Secondary | ICD-10-CM | POA: Diagnosis not present

## 2022-03-07 DIAGNOSIS — R1084 Generalized abdominal pain: Secondary | ICD-10-CM | POA: Diagnosis not present

## 2022-03-07 DIAGNOSIS — E860 Dehydration: Secondary | ICD-10-CM | POA: Diagnosis not present

## 2022-03-07 DIAGNOSIS — Z932 Ileostomy status: Secondary | ICD-10-CM | POA: Diagnosis not present

## 2022-03-07 NOTE — Unmapped (Signed)
Oncology Telephone Note    Reason for Call:   [name] is a 21 y.o. with [name] associated desmoid fibromatosis, chronic malnutrition, abdominal pain of unclear etiology.    Discussion:   Developed GI bleeding on Wednesday. Toilet bowl filling up with blood. Pain is increasing. No dizziness, lightheadedness, chest pain, dyspnea. Some increase in fatigue. Had been back on the sorafenib for about 3-4 days before bleeding started.    Plan:   Stop sorafenib  Present to ED if bleeding continues to worsen despite stopping the drug, or if she develops dizziness, lightheadedness, chest pain, dyspnea  If she ends up presenting and requiring hospital admission, would prefer she be cared for on Med O given her complex needs and multiple active tumors requiring anti-cancer therapy  Otherwise I will see her Tues with labs, infusion visit    Donzetta Sprung, MD/MPH  Assistant Professor  Bone and Soft Tissue Oncology Program  [facility]  Pager: [phone]  Nurse Navigator: Roseanne Reno RN, BSN, OCN

## 2022-03-08 DIAGNOSIS — E860 Dehydration: Secondary | ICD-10-CM | POA: Diagnosis not present

## 2022-03-08 DIAGNOSIS — R1084 Generalized abdominal pain: Secondary | ICD-10-CM | POA: Diagnosis not present

## 2022-03-08 DIAGNOSIS — R112 Nausea with vomiting, unspecified: Secondary | ICD-10-CM | POA: Diagnosis not present

## 2022-03-08 DIAGNOSIS — Z932 Ileostomy status: Secondary | ICD-10-CM | POA: Diagnosis not present

## 2022-03-09 DIAGNOSIS — K921 Melena: Secondary | ICD-10-CM | POA: Diagnosis not present

## 2022-03-09 DIAGNOSIS — F410 Panic disorder [episodic paroxysmal anxiety] without agoraphobia: Secondary | ICD-10-CM | POA: Diagnosis not present

## 2022-03-09 DIAGNOSIS — D509 Iron deficiency anemia, unspecified: Secondary | ICD-10-CM | POA: Diagnosis not present

## 2022-03-09 DIAGNOSIS — R112 Nausea with vomiting, unspecified: Secondary | ICD-10-CM | POA: Diagnosis not present

## 2022-03-09 DIAGNOSIS — R451 Restlessness and agitation: Secondary | ICD-10-CM | POA: Diagnosis not present

## 2022-03-09 DIAGNOSIS — F331 Major depressive disorder, recurrent, moderate: Secondary | ICD-10-CM | POA: Diagnosis not present

## 2022-03-09 DIAGNOSIS — Q8789 Other specified congenital malformation syndromes, not elsewhere classified: Secondary | ICD-10-CM | POA: Diagnosis not present

## 2022-03-09 DIAGNOSIS — E860 Dehydration: Secondary | ICD-10-CM | POA: Diagnosis not present

## 2022-03-09 DIAGNOSIS — F411 Generalized anxiety disorder: Secondary | ICD-10-CM | POA: Diagnosis not present

## 2022-03-09 DIAGNOSIS — Z980 Intestinal bypass and anastomosis status: Secondary | ICD-10-CM | POA: Diagnosis not present

## 2022-03-09 DIAGNOSIS — L270 Generalized skin eruption due to drugs and medicaments taken internally: Secondary | ICD-10-CM | POA: Diagnosis not present

## 2022-03-09 DIAGNOSIS — N854 Malposition of uterus: Secondary | ICD-10-CM | POA: Diagnosis not present

## 2022-03-09 DIAGNOSIS — R1110 Vomiting, unspecified: Secondary | ICD-10-CM | POA: Diagnosis not present

## 2022-03-09 DIAGNOSIS — R52 Pain, unspecified: Secondary | ICD-10-CM | POA: Diagnosis not present

## 2022-03-09 DIAGNOSIS — G894 Chronic pain syndrome: Secondary | ICD-10-CM | POA: Diagnosis not present

## 2022-03-09 DIAGNOSIS — R1030 Lower abdominal pain, unspecified: Secondary | ICD-10-CM | POA: Diagnosis not present

## 2022-03-09 DIAGNOSIS — D126 Benign neoplasm of colon, unspecified: Secondary | ICD-10-CM | POA: Diagnosis not present

## 2022-03-09 DIAGNOSIS — R7401 Elevation of levels of liver transaminase levels: Secondary | ICD-10-CM | POA: Diagnosis not present

## 2022-03-09 DIAGNOSIS — G8918 Other acute postprocedural pain: Secondary | ICD-10-CM | POA: Diagnosis not present

## 2022-03-09 DIAGNOSIS — D48119 Desmoid tumor of unspecified site: Secondary | ICD-10-CM | POA: Diagnosis not present

## 2022-03-09 DIAGNOSIS — Z6821 Body mass index (BMI) 21.0-21.9, adult: Secondary | ICD-10-CM | POA: Diagnosis not present

## 2022-03-09 DIAGNOSIS — K5903 Drug induced constipation: Secondary | ICD-10-CM | POA: Diagnosis not present

## 2022-03-09 DIAGNOSIS — E43 Unspecified severe protein-calorie malnutrition: Secondary | ICD-10-CM | POA: Diagnosis not present

## 2022-03-09 DIAGNOSIS — R1032 Left lower quadrant pain: Secondary | ICD-10-CM | POA: Diagnosis not present

## 2022-03-09 DIAGNOSIS — R109 Unspecified abdominal pain: Secondary | ICD-10-CM | POA: Diagnosis not present

## 2022-03-09 DIAGNOSIS — Z932 Ileostomy status: Secondary | ICD-10-CM | POA: Diagnosis not present

## 2022-03-09 DIAGNOSIS — R933 Abnormal findings on diagnostic imaging of other parts of digestive tract: Secondary | ICD-10-CM | POA: Diagnosis not present

## 2022-03-09 DIAGNOSIS — F32A Depression, unspecified: Secondary | ICD-10-CM | POA: Diagnosis not present

## 2022-03-09 DIAGNOSIS — R1031 Right lower quadrant pain: Secondary | ICD-10-CM | POA: Diagnosis not present

## 2022-03-09 DIAGNOSIS — Z452 Encounter for adjustment and management of vascular access device: Secondary | ICD-10-CM | POA: Diagnosis not present

## 2022-03-09 DIAGNOSIS — R100 Acute abdomen: Secondary | ICD-10-CM | POA: Diagnosis not present

## 2022-03-09 DIAGNOSIS — K567 Ileus, unspecified: Secondary | ICD-10-CM | POA: Diagnosis not present

## 2022-03-09 DIAGNOSIS — G479 Sleep disorder, unspecified: Secondary | ICD-10-CM | POA: Diagnosis not present

## 2022-03-09 DIAGNOSIS — R1013 Epigastric pain: Secondary | ICD-10-CM | POA: Diagnosis not present

## 2022-03-09 DIAGNOSIS — Z9049 Acquired absence of other specified parts of digestive tract: Secondary | ICD-10-CM | POA: Diagnosis not present

## 2022-03-09 DIAGNOSIS — R110 Nausea: Secondary | ICD-10-CM | POA: Diagnosis not present

## 2022-03-09 DIAGNOSIS — R1084 Generalized abdominal pain: Secondary | ICD-10-CM | POA: Diagnosis not present

## 2022-03-09 DIAGNOSIS — F419 Anxiety disorder, unspecified: Secondary | ICD-10-CM | POA: Diagnosis not present

## 2022-03-09 DIAGNOSIS — K625 Hemorrhage of anus and rectum: Secondary | ICD-10-CM | POA: Diagnosis not present

## 2022-03-09 DIAGNOSIS — D48114 Desmoid tumor, intraabdominal: Secondary | ICD-10-CM | POA: Diagnosis not present

## 2022-03-09 DIAGNOSIS — R140 Abdominal distension (gaseous): Secondary | ICD-10-CM | POA: Diagnosis not present

## 2022-03-09 DIAGNOSIS — D649 Anemia, unspecified: Secondary | ICD-10-CM | POA: Diagnosis not present

## 2022-03-09 DIAGNOSIS — G8929 Other chronic pain: Secondary | ICD-10-CM | POA: Diagnosis not present

## 2022-03-09 DIAGNOSIS — K284 Chronic or unspecified gastrojejunal ulcer with hemorrhage: Secondary | ICD-10-CM | POA: Diagnosis not present

## 2022-03-09 DIAGNOSIS — D1391 Familial adenomatous polyposis: Secondary | ICD-10-CM | POA: Diagnosis not present

## 2022-03-09 DIAGNOSIS — Z9221 Personal history of antineoplastic chemotherapy: Secondary | ICD-10-CM | POA: Diagnosis not present

## 2022-03-09 DIAGNOSIS — D500 Iron deficiency anemia secondary to blood loss (chronic): Secondary | ICD-10-CM | POA: Diagnosis not present

## 2022-03-09 DIAGNOSIS — Z4682 Encounter for fitting and adjustment of non-vascular catheter: Secondary | ICD-10-CM | POA: Diagnosis not present

## 2022-03-09 DIAGNOSIS — F329 Major depressive disorder, single episode, unspecified: Secondary | ICD-10-CM | POA: Diagnosis not present

## 2022-03-09 DIAGNOSIS — K922 Gastrointestinal hemorrhage, unspecified: Secondary | ICD-10-CM | POA: Diagnosis not present

## 2022-03-09 DIAGNOSIS — K91850 Pouchitis: Secondary | ICD-10-CM | POA: Diagnosis not present

## 2022-03-09 LAB — CBC W/ AUTO DIFF
BASOPHILS ABSOLUTE COUNT: 0 10*9/L (ref 0.0–0.1)
BASOPHILS RELATIVE PERCENT: 0.4 %
EOSINOPHILS ABSOLUTE COUNT: 0.1 10*9/L (ref 0.0–0.5)
EOSINOPHILS RELATIVE PERCENT: 1.5 %
HEMATOCRIT: 30.1 % — ABNORMAL LOW (ref 34.0–44.0)
HEMOGLOBIN: 10.1 g/dL — ABNORMAL LOW (ref 11.3–14.9)
LYMPHOCYTES ABSOLUTE COUNT: 2.3 10*9/L (ref 1.1–3.6)
LYMPHOCYTES RELATIVE PERCENT: 32.5 %
MEAN CORPUSCULAR HEMOGLOBIN CONC: 33.6 g/dL (ref 32.0–36.0)
MEAN CORPUSCULAR HEMOGLOBIN: 27.8 pg (ref 25.9–32.4)
MEAN CORPUSCULAR VOLUME: 82.8 fL (ref 77.6–95.7)
MEAN PLATELET VOLUME: 9.7 fL (ref 6.8–10.7)
MONOCYTES ABSOLUTE COUNT: 0.5 10*9/L (ref 0.3–0.8)
MONOCYTES RELATIVE PERCENT: 7.5 %
NEUTROPHILS ABSOLUTE COUNT: 4 10*9/L (ref 1.8–7.8)
NEUTROPHILS RELATIVE PERCENT: 58.1 %
PLATELET COUNT: 261 10*9/L (ref 150–450)
RED BLOOD CELL COUNT: 3.64 10*12/L — ABNORMAL LOW (ref 3.95–5.13)
RED CELL DISTRIBUTION WIDTH: 14.8 % (ref 12.2–15.2)
WBC ADJUSTED: 7 10*9/L (ref 3.6–11.2)

## 2022-03-09 LAB — COMPREHENSIVE METABOLIC PANEL
ALBUMIN: 3.8 g/dL (ref 3.4–5.0)
ALKALINE PHOSPHATASE: 48 U/L (ref 46–116)
ALT (SGPT): 12 U/L (ref 10–49)
ANION GAP: 8 mmol/L (ref 5–14)
AST (SGOT): 20 U/L (ref <=34)
BILIRUBIN TOTAL: 0.3 mg/dL (ref 0.3–1.2)
BLOOD UREA NITROGEN: 10 mg/dL (ref 9–23)
BUN / CREAT RATIO: 21
CALCIUM: 9.3 mg/dL (ref 8.7–10.4)
CHLORIDE: 108 mmol/L — ABNORMAL HIGH (ref 98–107)
CO2: 24 mmol/L (ref 20.0–31.0)
CREATININE: 0.48 mg/dL — ABNORMAL LOW
EGFR CKD-EPI (2021) FEMALE: >90 mL/min/{1.73_m2} (ref >=60)
GLUCOSE RANDOM: 103 mg/dL (ref 70–179)
POTASSIUM: 3.6 mmol/L (ref 3.4–4.8)
PROTEIN TOTAL: 6.9 g/dL (ref 5.7–8.2)
SODIUM: 140 mmol/L (ref 135–145)

## 2022-03-09 LAB — HCG QUANTITATIVE, BLOOD: GONADOTROPIN, CHORIONIC (HCG) QUANT: <2.6 m[IU]/mL

## 2022-03-09 LAB — APTT
APTT: 39 s — ABNORMAL HIGH (ref 24.8–38.4)
HEPARIN CORRELATION: 0.2

## 2022-03-09 LAB — PROTIME-INR
INR: 1.04
PROTIME: 11.6 s (ref 9.9–12.6)

## 2022-03-10 ENCOUNTER — Ambulatory Visit: Admit: 2022-03-10 | Payer: PRIVATE HEALTH INSURANCE

## 2022-03-10 ENCOUNTER — Ambulatory Visit
Admit: 2022-03-10 | Discharge: 2022-03-27 | Disposition: A | Discharge status: Home / Self Care | Payer: PRIVATE HEALTH INSURANCE | Admitting: Internal Medicine

## 2022-03-10 ENCOUNTER — Encounter
Admit: 2022-03-10 | Discharge: 2022-03-27 | Disposition: A | Discharge status: Home / Self Care | Payer: PRIVATE HEALTH INSURANCE | Admitting: Internal Medicine

## 2022-03-10 ENCOUNTER — Encounter: Admit: 2022-03-10 | Payer: PRIVATE HEALTH INSURANCE

## 2022-03-10 ENCOUNTER — Encounter: Admit: 2022-03-10 | Discharge: 2022-03-27 | Payer: PRIVATE HEALTH INSURANCE

## 2022-03-10 DIAGNOSIS — R112 Nausea with vomiting, unspecified: Secondary | ICD-10-CM | POA: Diagnosis not present

## 2022-03-10 DIAGNOSIS — Z932 Ileostomy status: Secondary | ICD-10-CM | POA: Diagnosis not present

## 2022-03-10 DIAGNOSIS — E860 Dehydration: Secondary | ICD-10-CM | POA: Diagnosis not present

## 2022-03-10 DIAGNOSIS — R1084 Generalized abdominal pain: Secondary | ICD-10-CM | POA: Diagnosis not present

## 2022-03-10 DIAGNOSIS — R109 Unspecified abdominal pain: Secondary | ICD-10-CM | POA: Diagnosis not present

## 2022-03-10 LAB — HEMOGLOBIN AND HEMATOCRIT, BLOOD
HEMATOCRIT: 27.5 % — ABNORMAL LOW (ref 34.0–44.0)
HEMOGLOBIN: 9.2 g/dL — ABNORMAL LOW (ref 11.3–14.9)

## 2022-03-10 LAB — LIPASE: LIPASE: 30 U/L (ref 12–53)

## 2022-03-10 MED ADMIN — ondansetron (ZOFRAN) injection 4 mg: 4 mg | INTRAVENOUS | @ 20:00:00 | Stop: 2022-03-10

## 2022-03-10 MED ADMIN — iohexol (OMNIPAQUE) 350 mg iodine/mL solution 100 mL: 100 mL | INTRAVENOUS | @ 09:00:00 | Stop: 2022-03-10

## 2022-03-10 MED ADMIN — HYDROmorphone (PF) (DILAUDID) injection 0.5 mg: .5 mg | INTRAVENOUS | @ 08:00:00 | Stop: 2022-03-10

## 2022-03-10 MED ADMIN — HYDROmorphone (PF) (DILAUDID) injection 1 mg: 1 mg | INTRAVENOUS | @ 17:00:00 | Stop: 2022-03-10

## 2022-03-10 MED ADMIN — ondansetron (ZOFRAN) injection 4 mg: 4 mg | INTRAVENOUS | @ 23:00:00 | Stop: 2022-03-10

## 2022-03-10 MED ADMIN — HYDROmorphone (PF) (DILAUDID) injection 1 mg: 1 mg | INTRAVENOUS | @ 20:00:00 | Stop: 2022-03-10

## 2022-03-10 MED ADMIN — ondansetron (ZOFRAN) injection 4 mg: 4 mg | INTRAVENOUS | @ 17:00:00 | Stop: 2022-03-10

## 2022-03-10 MED ADMIN — famotidine (PEPCID) tablet 20 mg: 20 mg | ORAL | @ 11:00:00 | Stop: 2022-03-10

## 2022-03-10 MED ADMIN — ondansetron (ZOFRAN) injection 4 mg: 4 mg | INTRAVENOUS | @ 08:00:00 | Stop: 2022-03-10

## 2022-03-10 MED ADMIN — HYDROmorphone (PF) (DILAUDID) injection 1 mg: 1 mg | INTRAVENOUS | @ 23:00:00 | Stop: 2022-03-10

## 2022-03-10 MED ADMIN — HYDROmorphone (PF) (DILAUDID) injection 1 mg: 1 mg | INTRAVENOUS | @ 13:00:00 | Stop: 2022-03-10

## 2022-03-10 MED ADMIN — ondansetron (ZOFRAN) injection 4 mg: 4 mg | INTRAVENOUS | @ 14:00:00 | Stop: 2022-03-10

## 2022-03-10 MED ADMIN — HYDROmorphone (PF) (DILAUDID) injection 1 mg: 1 mg | INTRAVENOUS | @ 09:00:00 | Stop: 2022-03-10

## 2022-03-10 NOTE — Unmapped (Signed)
[facility]  Emergency Department Provider Note     ED Clinical Impression     Final diagnoses:   Melena (Primary)   [name] (familial adenomatous polyposis)   Immunosuppression (CMS-HCC)      Impression, Medical Decision Making, ED Course     Impression: 22 y.o. female who has a past medical history of Abdominal pain, Acid reflux, Anesthesia complication, Cancer (CMS-HCC), Cataract of right eye, COVID-19 virus infection (01/2019), Cyst of thyroid determined by ultrasound, [name], Difficult intravenous access, [name] (familial adenomatous polyposis), [name], Gastric polyps, History of chemotherapy, History of colon polyps, History of COVID-19 (01/2019), Iron deficiency anemia due to chronic blood loss, PONV (postoperative nausea and vomiting), Rectal bleeding, and Syncopal episodes. who presents with concern for GI bleed as described below.     On evaluation in ED: Patient is lying in bed.  Patient is tachycardic.  Afebrile.  Appears to be in mild distress.  Significant abdominal pain to the palpation diffusely, greatest at right lower quadrant surgical incision.  Distention noted.  Cap refill 2+.  Good skin turgor.  No lower extremity edema.  Hemoccult positive faintly    DDx/MDM: Upper GI bleed, lower GI bleed, hemorrhoid, intra-abdominal abscess, among other etiologies.  Low suspicion for abscess, patient is afebrile.  She does have tenderness to the abdomen diffusely, worse in right lower quadrant, has history of multiple abdominal tumors.  Hemoglobin is reassuring at 10.1, which is higher than it had been in the past.  CMP reassuring.  CT abdomen pelvis with contrast ordered given her past medical history and complexity of the situation.    Diagnostic workup as below. Will treat patient with Zofran, Dilaudid.    Orders Placed This Encounter   Procedures    CT Abdomen Pelvis W IV Contrast Only    Comprehensive Metabolic Panel    CBC w/ Differential    hCG QUANTitative, Blood    PTT    PT-INR Lipase    Type and Screen       ED Course as of 03/10/22 0651   Mon Mar 10, 2022   1610 CT Abdomen Pelvis W IV Contrast Only  IMPRESSION:  Stable findings compared to 02/16/2022:  -Similar gaseous distention of small bowel loops adjacent to the ill-defined infiltrative mesenteric lesion in the inferior abdomen. No high-grade bowel obstruction.  -Similar appearance of multiple mesenteric lesions.   0503 MAO paged   0550 Reexamined.  Asking for food and water.  Still states she has significant pain in her abdomen.  Requesting famotidine.  Provided.    9604 Patient paged for admission for worsening abdominal pain, concern for GI bleed with prior history of GI bleeds.  Told by oncologist to come in to the ED to be admitted to medical team for any worsening symptoms.   5409 Patient signed out to oncoming provider, pending admission       MDM Elements  Discussion of Management with other Physicians, QHP or Appropriate Source: Pending discussion with admitting team  Independent Interpretation of Studies: CT scan(s) - largely unchanged from prior CT  I have reviewed recent and relevant previous record, including: Inpatient notes - recent inpatient notes for hospitalization for prior GI bleed and Outpatient notes - neurology note/telephone encounter for current presentation  Escalation of Care including OBS/Admission/Transfer was considered: Pending admission    ____________________________________________    The case was discussed with the attending physician, who is in agreement with the above assessment and plan.      History  Chief Complaint  Chief Complaint   Patient presents with    GI Bleeding       HPI   [name] [name] is a 22 y.o. female with past medical history as below who presents with concern for GI bleed.  Patient has a history of [name]/Gardner's syndrome associated desmoid fibromatosis, chronic malnutrition, chronic pain of abdomen.  States that she developed abdominal bleeding on Wednesday, saying that her toilet bowl was feeling with blood.  Says that she contacted her oncologist, who stopped her sorafenib.  Says in the past when she has had this GI bleeding, it went away after she stopped her Xarelto.  Says has been happening on and off for about a month.  This time, she has been off since Friday, and the bleeding has not stopped.  She states that she feels her stomach is more full, distended.  She denies lightheadedness, dizziness.  No chest pain, dyspnea, fevers, chills, vomiting, diarrhea.  Denies dark tarry stools, states it is bright red blood in the toilet bowl which is typical given her history of [name].  He was discharged on 02/19/2022 after admission for GI bleed.  Hemoglobin dropped from 11-7.6.  She underwent pouchoscopy with GI on 12/7 which showed a bleeding ulcer at the ileoanal anastomosis, APC was used to coagulate the area.  At the time, she had recently restarted Eliquis given her history of clotting clot/DVT.  She also has not history of pouchitis, was previously on cefdinir for this, last November and noted to not have active pouchitis.  She is not on Eliquis anymore.  She gets nutrition with TPN, worries that she is getting too much TPN.    Past Medical History:   Diagnosis Date    Abdominal pain     Acid reflux     occas    Anesthesia complication     itching, shaking, coldness; last few surgeries have gone much better    Cancer (CMS-HCC)     Cataract of right eye     COVID-19 virus infection 01/2019    Cyst of thyroid determined by ultrasound     monitoring    [name]     2 right forearm, 1 left thigh, 1 right scapula, 1 under left clavicle; multiple    Difficult intravenous access     [name] (familial adenomatous polyposis)     [name]     Gastric polyps     History of chemotherapy     last treatment approx 05/2019    History of colon polyps     History of COVID-19 01/2019    Iron deficiency anemia due to chronic blood loss     received iron infusion 11-2019    PONV (postoperative nausea and vomiting)     Rectal bleeding     Syncopal episodes     especially if becoming dehydrated       Past Surgical History:   Procedure Laterality Date    cyroablation      cystis removal      desmoid removal      PR CLOSE ENTEROSTOMY,RESEC+ANAST N/A 10/09/2020    Procedure: ILEOSTOMY TAKEDOWN;  Surgeon: Mickle Asper, MD;  Location: OR [place];  Service: General Surgery    PR COLONOSCOPY W/BIOPSY SINGLE/MULTIPLE N/A 10/27/2012    Procedure: COLONOSCOPY, FLEXIBLE, PROXIMAL TO SPLENIC FLEXURE; WITH BIOPSY, SINGLE OR MULTIPLE;  Surgeon: Shirlyn Goltz Mir, MD;  Location: PEDS PROCEDURE ROOM [facility];  Service: Gastroenterology    PR COLONOSCOPY W/BIOPSY SINGLE/MULTIPLE  N/A 09/14/2013    Procedure: COLONOSCOPY, FLEXIBLE, PROXIMAL TO SPLENIC FLEXURE; WITH BIOPSY, SINGLE OR MULTIPLE;  Surgeon: Shirlyn Goltz Mir, MD;  Location: PEDS PROCEDURE ROOM [facility];  Service: Gastroenterology    PR COLONOSCOPY W/BIOPSY SINGLE/MULTIPLE N/A 11/08/2014    Procedure: COLONOSCOPY, FLEXIBLE, PROXIMAL TO SPLENIC FLEXURE; WITH BIOPSY, SINGLE OR MULTIPLE;  Surgeon: Arnold Long Mir, MD;  Location: PEDS PROCEDURE ROOM [facility];  Service: Gastroenterology    PR COLONOSCOPY W/BIOPSY SINGLE/MULTIPLE N/A 12/26/2015    Procedure: COLONOSCOPY, FLEXIBLE, PROXIMAL TO SPLENIC FLEXURE; WITH BIOPSY, SINGLE OR MULTIPLE;  Surgeon: Arnold Long Mir, MD;  Location: PEDS PROCEDURE ROOM [facility];  Service: Gastroenterology    PR COLONOSCOPY W/BIOPSY SINGLE/MULTIPLE N/A 09/02/2017    Procedure: COLONOSCOPY, FLEXIBLE, PROXIMAL TO SPLENIC FLEXURE; WITH BIOPSY, SINGLE OR MULTIPLE;  Surgeon: Arnold Long Mir, MD;  Location: PEDS PROCEDURE ROOM [place];  Service: Gastroenterology    PR COLSC FLX W/REMOVAL LESION BY HOT BX FORCEPS N/A 08/27/2016    Procedure: COLONOSCOPY, FLEXIBLE, PROXIMAL TO SPLENIC FLEXURE; W/REMOVAL TUMOR/POLYP/OTHER LESION, HOT BX FORCEP/CAUTE;  Surgeon: Arnold Long Mir, MD;  Location: PEDS PROCEDURE ROOM [facility];  Service: Gastroenterology    PR COLSC FLX W/RMVL OF TUMOR POLYP LESION SNARE TQ N/A 02/25/2019    Procedure: COLONOSCOPY FLEX; W/REMOV TUMOR/LES BY SNARE;  Surgeon: Helyn Numbers, MD;  Location: GI PROCEDURES [place] [facility];  Service: Gastroenterology    PR COLSC FLX W/RMVL OF TUMOR POLYP LESION SNARE TQ N/A 03/13/2020    Procedure: COLONOSCOPY FLEX; W/REMOV TUMOR/LES BY SNARE;  Surgeon: Helyn Numbers, MD;  Location: [facility]ED[facility] [facility];  Service: Gastroenterology    PR EXC SKIN BENIG 2.1-3 CM TRUNK,ARM,LEG Right 02/25/2017    Procedure: EXCISION, BENIGN LESION INCLUDE MARGINS, EXCEPT SKIN TAG, LEGS; EXCISED DIAMETER 2.1 TO 3.0 CM;  Surgeon: Clarene Duke, MD;  Location: CHILDRENS OR [facility];  Service: Plastics    PR EXC SKIN BENIG 3.1-4 CM TRUNK,ARM,LEG Right 02/25/2017    Procedure: EXCISION, BENIGN LESION INCLUDE MARGINS, EXCEPT SKIN TAG, ARMS; EXCISED DIAMETER 3.1 TO 4.0 CM;  Surgeon: Clarene Duke, MD;  Location: CHILDRENS OR [facility];  Service: Plastics    PR EXC SKIN BENIG >4 CM FACE,FACIAL Right 02/25/2017    Procedure: EXCISION, OTHER BENIGN LES INCLUD MARGIN, FACE/EARS/EYELIDS/NOSE/LIPS/MUCOUS MEMBRANE; EXCISED DIAM >4.0 CM;  Surgeon: Clarene Duke, MD;  Location: [facility]ILDRENS OR [facility];  Service: Plastics    PR EXC TUMOR SOFT TISSUE LEG/ANKLE SUBQ 3+CM Right 08/05/2019    Procedure: EXCISION, TUMOR, SOFT TISSUE OF LEG OR ANKLE AREA, SUBCUTANEOUS; 3 CM OR GREATER;  Surgeon: Arsenio Katz, MD;  Location: MAIN OR [place];  Service: Plastics    PR EXC TUMOR SOFT TISSUE LEG/ANKLE SUBQ <3CM Right 08/05/2019    Procedure: EXCISION, TUMOR, SOFT TISSUE OF LEG OR ANKLE AREA, SUBCUTANEOUS; LESS THAN 3 CM;  Surgeon: Arsenio Katz, MD;  Location: MAIN OR [facility];  Service: Plastics    PR LAP, SURG PROCTECTOMY W J-POUCH N/A 08/10/2020    Procedure: ROBOTIC ASSISTED LAPAROSCOPIC PROCTOCOLECTOMY, ILEAL J POUCH, WITH OSTOMY;  Surgeon: Mickle Asper, MD;  Location: OR [place];  Service: General Surgery    PR NDSC EVAL INTSTINAL POUCH DX W/COLLJ SPEC SPX N/A 01/23/2021    Procedure: ENDO EVAL SM INTEST POUCH; DX;  Surgeon: Modena Nunnery, MD;  Location: [facility] PROCED[facility] [place] [facility];  Service: Gastroenterology    PR NDSC EVAL INTSTINAL POUCH DX W/COLLJ SPEC SPX N/A 08/27/2021    Procedure: ENDO EVAL SM INTEST POUCH; DX;  Surgeon: Georga Hacking  Jon Gills, MD;  Location: [facility] PROCEDURES [facility];  Service: Gastroenterology    PR NDSC EVAL INTSTINAL POUCH DX W/COLLJ SPEC SPX N/A 12/09/2021    Procedure: ENDO EVAL SM INTEST POUCH; DX;  Surgeon: Vidal Schwalbe, MD;  Location: GI PROCEDURES [facility];  Service: Gastroenterology    PR NDSC EVAL INTSTINAL POUCH W/BX SINGLE/MULTIPLE N/A 01/20/2022    Procedure: ENDOSCOPIC EVAL OF SMALL INTESTINAL POUCH; DIAGNOSTIC, No biopsies;  Surgeon: Andrey Farmer, MD;  Location: GI PROCEDURES [facility];  Service: Gastroenterology    PR NDSC EVAL INTSTINAL POUCH W/BX SINGLE/MULTIPLE N/A 02/13/2022    Procedure: ENDOSCOPIC EVAL OF SMALL INTESTINAL POUCH; DIAGNOSTIC, WITH BIOPSY;  Surgeon: Bronson Curb, MD;  Location: GI PROCEDURES [facility];  Service: Gastroenterology    PR UNLISTED PROCEDURE SMALL INTESTINE  01/23/2021    Procedure: UNLISTED PROCEDURE, SMALL INTESTINE;  Surgeon: Modena Nunnery, MD;  Location: [facility] PROCEDURES [place] [facility];  Service: Gastroenterology    PR UNLISTED PROCEDURE SMALL INTESTINE  02/13/2022    Procedure: UNLISTED PROCEDURE, SMALL INTESTINE;  Surgeon: Bronson Curb, MD;  Location: GI PROCEDURES [facility];  Service: Gastroenterology    PR UPPER GI ENDOSCOPY,BIOPSY N/A 10/27/2012    Procedure: UGI ENDOSCOPY; WITH BIOPSY, SINGLE OR MULTIPLE;  Surgeon: Shirlyn Goltz Mir, MD;  Location: PEDS PROCEDURE ROOM [facility];  Service: Gastroenterology    PR UPPER GI ENDOSCOPY,BIOPSY N/A 09/14/2013    Procedure: UGI ENDOSCOPY; WITH BIOPSY, SINGLE OR MULTIPLE;  Surgeon: Shirlyn Goltz Mir, MD;  Location: PEDS PROCEDURE ROOM [facility];  Service: Gastroenterology    PR UPPER GI ENDOSCOPY,BIOPSY N/A 11/08/2014    Procedure: UGI ENDOSCOPY; WITH BIOPSY, SINGLE OR MULTIPLE;  Surgeon: Arnold Long Mir, MD;  Location: PEDS PROCEDURE ROOM [facility];  Service: Gastroenterology    PR UPPER GI ENDOSCOPY,BIOPSY N/A 12/26/2015    Procedure: UGI ENDOSCOPY; WITH BIOPSY, SINGLE OR MULTIPLE;  Surgeon: Arnold Long Mir, MD;  Location: PEDS PROCEDURE ROOM [facility];  Service: Gastroenterology    PR UPPER GI ENDOSCOPY,BIOPSY N/A 08/27/2016    Procedure: UGI ENDOSCOPY; WITH BIOPSY, SINGLE OR MULTIPLE;  Surgeon: Arnold Long Mir, MD;  Location: PEDS PROCEDURE ROOM [facility];  Service: Gastroenterology    PR UPPER GI ENDOSCOPY,BIOPSY N/A 09/02/2017    Procedure: UGI ENDOSCOPY; WITH BIOPSY, SINGLE OR MULTIPLE;  Surgeon: Arnold Long Mir, MD;  Location: PEDS PROCEDURE ROOM [facility];  Service: Gastroenterology    PR UPPER GI ENDOSCOPY,BIOPSY N/A 03/13/2020    Procedure: UGI ENDOSCOPY; WITH BIOPSY, SINGLE OR MULTIPLE;  Surgeon: Helyn Numbers, MD;  Location: [facility] [place] [facility];  Service: Gastroenterology    PR UPPER GI ENDOSCOPY,BIOPSY N/A 09/05/2021    Procedure: UGI ENDOSCOPY; WITH BIOPSY, SINGLE OR MULTIPLE;  Surgeon: Wendall Papa, MD;  Location: GI PROCEDURES [facility];  Service: Gastroenterology    PR UPPER GI ENDOSCOPY,DIAGNOSIS N/A 01/20/2022    Procedure: UGI ENDO, INCLUDE ESOPHAGUS, STOMACH, & DUODENUM &/OR JEJUNUM; DX W/WO COLLECTION SPECIMN, BY BRUSH OR WASH;  Surgeon: Andrey Farmer, MD;  Location: GI PROCEDURES [facility];  Service: Gastroenterology    TUMOR REMOVAL      multiple-head, neck, back, hand, right flank, multiple       No current facility-administered medications for this encounter.    Current Outpatient Medications:     acetaminophen (TYLENOL) 500 MG tablet, Take 2 tablets (1,000 mg total) by mouth every eight (8) hours., Disp: 30 tablet, Rfl: 0    ammonium lactate (LAC-HYDRIN) 12 % lotion, Apply 1 Application topically two (2) times a  day., Disp: , Rfl:     baclofen (LIORESAL) 5 mg Tab tablet, Take 1-2 tablets (5-10 mg total) by mouth two (2) times a day as needed for muscle spasms., Disp: 40 tablet, Rfl: 0    buprenorphine 10 mcg/hour PTWK transdermal patch, Place 1 patch on the skin every seven (7) days., Disp: 4 patch, Rfl: 2    cefdinir (OMNICEF) 300 MG capsule, Take 1 capsule (300 mg total) by mouth daily., Disp: , Rfl:     clobetasoL (TEMOVATE) 0.05 % ointment, Apply topically two (2) times a day. To stubborn/thick rashes on hands/feet ears until clear/smooth. Restart as needed, Disp: 60 g, Rfl: 3    diclofenac sodium (VOLTAREN) 1 % gel, Apply 2 g topically four (4) times a day., Disp: 100 g, Rfl: 0    diphenhydrAMINE (BENADRYL) 50 mg capsule, Take 1 capsule (50 mg total) by mouth every six (6) hours as needed for itching (per patient preference (if prefers over hydroxyzine))., Disp: 30 each, Rfl: 0    famotidine (PEPCID) 20 MG tablet, Take 1 tablet (20 mg total) by mouth nightly., Disp: 90 tablet, Rfl: 3    gabapentin (NEURONTIN) 100 MG capsule, Take 2 capsules (200 mg total) by mouth nightly., Disp: 60 capsule, Rfl: 0    hyoscyamine (LEVSIN) 0.125 mg tablet, Take 1 tablet (0.125 mg total) by mouth every four (4) hours as needed for cramping., Disp: , Rfl:     lidocaine (LIDODERM) 5 % patch, Place 1 patch on the skin daily. Apply to affected area for 12 hours only each day (then remove patch), Disp: , Rfl:     lidocaine 2% viscous (LIDOCAINE) 2 % Soln, 15 mL by Mouth route every four (4) hours as needed., Disp: 100 mL, Rfl: 3    menthol-zinc oxide (CALMOSEPTINE) 0.44-20.6 % Oint, Apply topically four (4) times a day as needed., Disp: 113 g, Rfl: 11    mirtazapine (REMERON) 7.5 MG tablet, Take 1 tablet (7.5 mg total) by mouth nightly., Disp: 90 tablet, Rfl: 3    naloxone (NARCAN) 4 mg nasal spray, One spray in either nostril once for known/suspected opioid overdose. May repeat every 2-3 minutes in alternating nostril til EMS arrives, Disp: 2 each, Rfl: 0    OLANZapine (ZYPREXA) 7.5 MG tablet, TAKE 1 TABLET BY MOUTH EVERY NIGHTLY, Disp: 90 tablet, Rfl: 1    ondansetron (ZOFRAN-ODT) 8 MG disintegrating tablet, Take 1 tablet (8 mg total) by mouth every twelve (12) hours as needed for nausea., Disp: , Rfl:     oxyCODONE (ROXICODONE) 10 mg immediate release tablet, Take 1-1.5 tablets (10-15 mg total) by mouth every four (4) hours as needed for pain. Fill on or after: 02/26/22, Disp: 210 tablet, Rfl: 0    pimecrolimus (ELIDEL) 1 % cream, Apply topically two (2) times a day. To face as needed for rash, Disp: 30 g, Rfl: 11    silver sulfaDIAZINE (SILVADENE) 1 % cream, Apply topically daily. To burn on leg, Disp: 50 g, Rfl: 11    SLEEPY BUTTER, Swish and spit 10 mL 4 times a day as needed., Disp: 300 mL, Rfl: 3    sodium/pot/mag/calc/chlor/acet (TPN ELECTROLYTES IV), Receiving IV TPN, Disp: , Rfl:     triamcinolone (KENALOG) 0.1 % cream, Apply topically two (2) times a day., Disp: 28 g, Rfl: 3    zinc oxide 10 % Crea, Apply 1 application. topically daily as needed., Disp: , Rfl:     Allergies  Adhesive tape-silicones; Ferrlecit [sodium ferric gluconat-sucrose]; Levofloxacin;  Methylnaltrexone; Neomycin; Papaya; Morphine; Zosyn [piperacillin-tazobactam]; Compazine [prochlorperazine]; and Latex, natural rubber    Family History  Family History   Problem Relation Age of Onset    No Known Problems Mother     No Known Problems Father     No Known Problems Sister     No Known Problems Brother     Stroke Maternal Grandmother     Other Maternal Grandmother         benign lesions of liver and pancreas, further details unknown    Cancer Maternal Grandmother     Diabetes Maternal Grandmother     Hypertension Maternal Grandmother     Thyroid disease Maternal Grandmother     Arthritis Maternal Grandfather     Asthma Maternal Grandfather     COPD Paternal Grandmother         Deceased    Miscarriages / Stillbirths Paternal Grandmother     Alcohol abuse Paternal Grandfather Deceased    No Known Problems Maternal Aunt     No Known Problems Maternal Uncle     No Known Problems Paternal Aunt     No Known Problems Paternal Uncle     Anesthesia problems Neg Hx     Broken bones Neg Hx     Cancer Neg Hx     Clotting disorder Neg Hx     Collagen disease Neg Hx     Diabetes Neg Hx     Dislocations Neg Hx     Fibromyalgia Neg Hx     Gout Neg Hx     Hemophilia Neg Hx     Osteoporosis Neg Hx     Rheumatologic disease Neg Hx     Scoliosis Neg Hx     Severe sprains Neg Hx     Sickle cell anemia Neg Hx     Spinal Compression Fracture Neg Hx     Melanoma Neg Hx     Basal cell carcinoma Neg Hx     Squamous cell carcinoma Neg Hx        Social History  Social History     Tobacco Use    Smoking status: Never     Passive exposure: Past    Smokeless tobacco: Never   Vaping Use    Vaping Use: Never used   Substance Use Topics    Alcohol use: Never    Drug use: Never        Physical Exam     VITAL SIGNS:      Vitals:    03/09/22 1944 03/09/22 2259 03/10/22 0600 03/10/22 0606   BP: 126/89 129/73 117/61    Pulse: 122 106 78    Resp: 18  16    Temp: 36.9 ??C (98.4 ??F) 36.7 ??C (98.1 ??F)     TempSrc: Oral Oral     SpO2: 100% 96% 97% 97%   Weight: 58.5 kg (129 lb)          Constitutional: Alert and oriented. No acute distress.  Eyes: Conjunctivae are normal.  HEENT: Normocephalic and atraumatic. Conjunctivae clear. No congestion. Moist mucous membranes.   Cardiovascular: Rate as above, regular rhythm. Normal and symmetric distal pulses. Brisk capillary refill. Normal skin turgor.  Respiratory: Normal respiratory effort. Breath sounds are normal. There are no wheezing or crackles heard.  Gastrointestinal: Distended, mildly tender to palpation.  Scar noted in epigastric region, as well as right lower quadrant.  Pain greatest in right lower quadrant  Genitourinary: Deferred.  Musculoskeletal: Non-tender with normal  range of motion in all extremities.  Neurologic: Normal speech and language. No gross focal neurologic deficits are appreciated. Patient is moving all extremities equally, face is symmetric at rest and with speech.  Skin: Skin is warm, dry and intact. No rash noted.  Psychiatric: Mood and affect are normal. Speech and behavior are normal.     Radiology     CT Abdomen Pelvis W IV Contrast Only   Preliminary Result   Stable findings compared to 02/16/2022:   -Similar gaseous distention of small bowel loops adjacent to the ill-defined infiltrative mesenteric lesion in the inferior abdomen. No high-grade bowel obstruction.   -Similar appearance of multiple mesenteric lesions.                Pertinent labs & imaging results that were available during my care of the patient were independently interpreted by me and considered in my medical decision making (see chart for details).    Portions of this record have been created using Scientist, clinical (histocompatibility and immunogenetics). Dictation errors have been sought, but may not have been identified and corrected.         Leslye Peer, MD  Resident  03/10/22 [phone]

## 2022-03-10 NOTE — Unmapped (Signed)
ED Progress Note    Received sign out from previous provider.    Patient Summary: [name] [name] is a 22 y.o. female past medical history of a PE, [name], gastric polyps, desmoid tumor, presenting to the emergency department with concerns of abdominal bleeding that began on Wednesday, saying that her elbow was feeling of blood, CBC to her oncologist and he recommended that she come in for evaluation in the emergency department today.  CT abdomen pelvis in the emergency department showed no acute findings with similar gaseous distention of small bowel loops adjacent to ill-defined infiltrative mesenteric lesion in the inferior abdomen, without evidence of bowel obstruction, at time of signout, she is pending admission for worsening abdominal pain and concern for GI bleed with prior history of the same.  Action List:   Admission    Updates  ED Course as of 03/10/22 1859   Mon Mar 10, 2022   1610 Patient was asking for additional pain medication, last dose of Dilaudid was at 0409, Will provide her with another dose of Dilaudid,, later was concerned for some nausea, will give her another dose of Zofran, will get a screening EKG to check QTc.   0847 ECG 12 Lead  My interpretation: Normal sinus rhythm, rate of 79, normal axis, no obvious ST elevations, T wave inversions, intervals are appropriate, good R wave progression, QTc is 431.  Nonischemic EKG.    9604 CT Abdomen Pelvis W IV Contrast Only  IMPRESSION:  Stable findings compared to 02/16/2022:  -Similar gaseous distention of small bowel loops adjacent to the ill-defined infiltrative mesenteric lesion in the inferior abdomen. No high-grade bowel obstruction.  -Similar appearance of multiple mesenteric lesions.     1824 HGB(!): 9.2  Decreased by 1 point from previous.   1824 However the patient nighttime meds, she is likely not to be admitted today as there is a shortage of beds for the oncology team.   1839 At the time of signout, patient is stable, not pending admission to the hospital I have provided her home medications and consulted with oncology to ensure that there is no other needs to the department while she is boarding in the emergency department.  Signout given to oncoming ED resident.

## 2022-03-10 NOTE — Unmapped (Signed)
Patient here with a suspected GIB that started several days ago. On active chemo

## 2022-03-10 NOTE — Unmapped (Signed)
ED Progress Note    ED Course as of 03/10/22 1536   Mon Mar 10, 2022   0708 Signout: 22 yo with gardner's syndrome; here with c/f GIB; h/h stable; faintly pos guaiac; cT unchanged from prior;    1029 EKG: NSR, rate of 79; normal axis; no ST elevation or depression as read by me

## 2022-03-10 NOTE — Unmapped (Signed)
Patient reports having bright red blood in her stool, abdominal pain, and nausea since Wednesday. Oncologist told her to come in- see note. MD had her stop chemo 2 days ago to see if it would help but did not. Denies vomiting, patient on TPN. Was seen here earlier this month for the same and required a blood transfusion.

## 2022-03-11 ENCOUNTER — Ambulatory Visit: Admit: 2022-03-11 | Payer: PRIVATE HEALTH INSURANCE

## 2022-03-11 DIAGNOSIS — E860 Dehydration: Secondary | ICD-10-CM | POA: Diagnosis not present

## 2022-03-11 DIAGNOSIS — R112 Nausea with vomiting, unspecified: Secondary | ICD-10-CM | POA: Diagnosis not present

## 2022-03-11 DIAGNOSIS — K921 Melena: Secondary | ICD-10-CM | POA: Diagnosis not present

## 2022-03-11 DIAGNOSIS — R1084 Generalized abdominal pain: Secondary | ICD-10-CM | POA: Diagnosis not present

## 2022-03-11 DIAGNOSIS — Z932 Ileostomy status: Secondary | ICD-10-CM | POA: Diagnosis not present

## 2022-03-11 LAB — BASIC METABOLIC PANEL
ANION GAP: 5 mmol/L (ref 5–14)
BLOOD UREA NITROGEN: 10 mg/dL (ref 9–23)
BUN / CREAT RATIO: 18
CALCIUM: 8.9 mg/dL (ref 8.7–10.4)
CHLORIDE: 110 mmol/L — ABNORMAL HIGH (ref 98–107)
CO2: 27 mmol/L (ref 20.0–31.0)
CREATININE: 0.55 mg/dL
EGFR CKD-EPI (2021) FEMALE: >90 mL/min/{1.73_m2} (ref >=60)
GLUCOSE RANDOM: 94 mg/dL (ref 70–179)
POTASSIUM: 3.6 mmol/L (ref 3.5–5.1)
SODIUM: 142 mmol/L (ref 135–145)

## 2022-03-11 LAB — CBC
HEMATOCRIT: 26.7 % — ABNORMAL LOW (ref 34.0–44.0)
HEMATOCRIT: 26.4 % — ABNORMAL LOW (ref 34.0–44.0)
HEMATOCRIT: 27.3 % — ABNORMAL LOW (ref 34.0–44.0)
HEMOGLOBIN: 8.8 g/dL — ABNORMAL LOW (ref 11.3–14.9)
HEMOGLOBIN: 8.8 g/dL — ABNORMAL LOW (ref 11.3–14.9)
HEMOGLOBIN: 9 g/dL — ABNORMAL LOW (ref 11.3–14.9)
MEAN CORPUSCULAR HEMOGLOBIN CONC: 33 g/dL (ref 32.0–36.0)
MEAN CORPUSCULAR HEMOGLOBIN CONC: 33.2 g/dL (ref 32.0–36.0)
MEAN CORPUSCULAR HEMOGLOBIN CONC: 32.9 g/dL (ref 32.0–36.0)
MEAN CORPUSCULAR HEMOGLOBIN: 27.6 pg (ref 25.9–32.4)
MEAN CORPUSCULAR HEMOGLOBIN: 27.5 pg (ref 25.9–32.4)
MEAN CORPUSCULAR HEMOGLOBIN: 27.4 pg (ref 25.9–32.4)
MEAN CORPUSCULAR VOLUME: 83.6 fL (ref 77.6–95.7)
MEAN CORPUSCULAR VOLUME: 83 fL (ref 77.6–95.7)
MEAN CORPUSCULAR VOLUME: 83.5 fL (ref 77.6–95.7)
MEAN PLATELET VOLUME: 9.5 fL (ref 6.8–10.7)
MEAN PLATELET VOLUME: 9.5 fL (ref 6.8–10.7)
MEAN PLATELET VOLUME: 9.4 fL (ref 6.8–10.7)
PLATELET COUNT: 253 10*9/L (ref 150–450)
PLATELET COUNT: 244 10*9/L (ref 150–450)
PLATELET COUNT: 258 10*9/L (ref 150–450)
RED BLOOD CELL COUNT: 3.2 10*12/L — ABNORMAL LOW (ref 3.95–5.13)
RED BLOOD CELL COUNT: 3.18 10*12/L — ABNORMAL LOW (ref 3.95–5.13)
RED BLOOD CELL COUNT: 3.27 10*12/L — ABNORMAL LOW (ref 3.95–5.13)
RED CELL DISTRIBUTION WIDTH: 14.9 % (ref 12.2–15.2)
RED CELL DISTRIBUTION WIDTH: 15.2 % (ref 12.2–15.2)
RED CELL DISTRIBUTION WIDTH: 14.9 % (ref 12.2–15.2)
WBC ADJUSTED: 5.9 10*9/L (ref 3.6–11.2)
WBC ADJUSTED: 5.3 10*9/L (ref 3.6–11.2)
WBC ADJUSTED: 6.1 10*9/L (ref 3.6–11.2)

## 2022-03-11 LAB — MAGNESIUM
MAGNESIUM: 2.4 mg/dL (ref 1.6–2.6)
MAGNESIUM: 2.4 mg/dL (ref 1.6–2.6)

## 2022-03-11 LAB — PHOSPHORUS: PHOSPHORUS: 3.5 mg/dL (ref 2.4–5.1)

## 2022-03-11 LAB — IRON PANEL
IRON SATURATION: 7 % — ABNORMAL LOW (ref 20–55)
IRON: 20 ug/dL — ABNORMAL LOW
TOTAL IRON BINDING CAPACITY: 274 ug/dL (ref 250–425)

## 2022-03-11 LAB — FERRITIN: FERRITIN: 43.7 ng/mL

## 2022-03-11 MED ADMIN — HYDROmorphone (PF) (DILAUDID) injection 1 mg: 1 mg | INTRAVENOUS | @ 02:00:00 | Stop: 2022-03-11

## 2022-03-11 MED ADMIN — HYDROmorphone (PF) (DILAUDID) injection 1 mg: 1 mg | INTRAVENOUS | @ 07:00:00 | Stop: 2022-03-11

## 2022-03-11 MED ADMIN — HYDROmorphone (PF) (DILAUDID) injection 1 mg: 1 mg | INTRAVENOUS | @ 16:00:00 | Stop: 2022-03-11

## 2022-03-11 MED ADMIN — HYDROmorphone (PF) (DILAUDID) injection 1 mg: 1 mg | INTRAVENOUS | @ 23:00:00 | Stop: 2022-03-11

## 2022-03-11 MED ADMIN — diphenhydrAMINE (BENADRYL) capsule 50 mg: 50 mg | ORAL | @ 23:00:00

## 2022-03-11 MED ADMIN — ondansetron (ZOFRAN) injection 4 mg: 4 mg | INTRAVENOUS | @ 20:00:00 | Stop: 2022-03-11

## 2022-03-11 MED ADMIN — cefdinir (OMNICEF) capsule 300 mg: 300 mg | ORAL | @ 13:00:00 | Stop: 2022-03-11

## 2022-03-11 MED ADMIN — OLANZapine zydis (ZyPREXA) disintegrating tablet 7.5 mg: 7.5 mg | ORAL | @ 02:00:00 | Stop: 2022-03-17

## 2022-03-11 MED ADMIN — HYDROmorphone (PF) (DILAUDID) injection 1 mg: 1 mg | INTRAVENOUS | @ 13:00:00 | Stop: 2022-03-11

## 2022-03-11 MED ADMIN — HYDROmorphone (PF) (DILAUDID) injection 1 mg: 1 mg | INTRAVENOUS | @ 10:00:00 | Stop: 2022-03-11

## 2022-03-11 MED ADMIN — cefdinir (OMNICEF) capsule 300 mg: 300 mg | ORAL | Stop: 2022-03-17

## 2022-03-11 MED ADMIN — HYDROmorphone (PF) (DILAUDID) injection 1 mg: 1 mg | INTRAVENOUS | @ 20:00:00 | Stop: 2022-03-11

## 2022-03-11 MED ADMIN — lidocaine 4 % patch 2 patch: 2 | TRANSDERMAL | @ 13:00:00

## 2022-03-11 MED ADMIN — diclofenac sodium (VOLTAREN) 1 % gel 2 g: 2 g | TOPICAL | @ 16:00:00

## 2022-03-11 MED ADMIN — famotidine (PEPCID) tablet 20 mg: 20 mg | ORAL | @ 02:00:00 | Stop: 2022-03-17

## 2022-03-11 MED ADMIN — mirtazapine (REMERON) tablet 7.5 mg: 7.5 mg | ORAL | @ 02:00:00

## 2022-03-11 MED ADMIN — diclofenac sodium (VOLTAREN) 1 % gel 2 g: 2 g | TOPICAL | @ 23:00:00

## 2022-03-11 NOTE — Unmapped (Signed)
Bed: 10-A  Expected date:   Expected time:   Means of arrival:   Comments:  Team P

## 2022-03-11 NOTE — Unmapped (Signed)
Gastroenterology (Luminal) Consult Service   Initial Consultation         Assessment and Recommendations:   [name] [name] is a 22 y.o. 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   The patient is seen in consultation at the request of [name], MD ([facility] for hematochezia and abdominal pain .    [name] [name] reports onset of BRBPR approximately 1 week ago. Blood was seen both mixed in with stool and in the toilet bowl. She has associated abdominal discomfort and mild distention. She remains hemodynamically stable, with stable Hgb. She and her mom are primarily concerned with symptoms of distention and CT findings showing stable gaseous distention of small bowel loops.     We reviewed the following:  1. BRBRP - given stable Hgb and recent pouch exam with known anastomotic ulcerations, no indication for repeat endoscopic exam at this time. No current symptoms to suggest ongoing fissure as potential contributing symptoms. Reviewed with oncology potential contribution of sorafenib. Per Oncology, pt to stop sorafenib with consideration of alternative chemotherapy.    -- Please maintain two large-bore (16-18G) peripheral IVs at all times  -- Keep type & screen active  -- Follow the patient's Hgb every 8-12 hours and transfuse for Hgb <7  -- Stop any NSAIDs, aspirin, antiplatelets, and anticoagulants  If hemoglobin remains stable while hospitalized, consider interval pouchoscopy to assess ulcer healing, to be arranged as an outpatient.     2. Abdominal distention - with previous surgical history differential includes obstruction vs SIBO. 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 CT scan confirms no obstruction. She has been on chronic cefdinir which increases risk of SIBO. Narcotic bowel syndrome, decreasing motility of gut, is also likely a factor given increasing opioid requirements.   -- STOP cefdinir, START ciprofloxacin 500mg  PO BID x 14 days   -- pain management consult, goal to optimize non-narcotic pain regimen     3. History of abnormal pouch biopsy pouchitis/adenomatous change - no gross endoscopic evidence of pouchitis on recent pouch exam. Similarly no clear structure to suggest adenomatous changes. Pathology of tissues adjacent to ulceration read pouchitis and adenomatous changes as describes however this is likely due to biopsy location rather than representation of disease activity within pouch itself. No changes in therapy recommended to address this biopsy result at this time. Can consider repeat pouch exam as an outpatient if symptoms remain.         Issues Impacting Complexity of Management:  -Discussed this patient's care with Dr. Otilio Connors from the oncology service as summarized in this note    Recommendations discussed with the patient's primary team. We will continue to follow along with you.    For questions, contact the on-call fellow for the Gastroenterology (Luminal) Consult Service.    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     [name] [name] presented after having 6 to 7 days of bright red blood per rectum.  She reports on 12/27 she started noticing bright red blood in the toilet bowl.  She had multiple episodes every time she had a  bowel movement that day.  She reported seeing bright red blood filled up the toilet bowl. Blood also noted to be mixed with stool. Prior to admission last right bright red bowel movement was a.m. 1/1. Associated worsening of abdominal pain.  Described as stabbing in the right and left lower quadrants.  It is worse with ambulation with lying flat. She has been managing her pain with her outpatient opioids as prescribed by her pain management doctor.  She has been taking 15 mg of oxycodone every 4 hours.  This has not been sufficient to control her pain.    She denies any recent fevers.  She has not been vomiting but does have occasional nausea.    On admission she was afebrile and hemodynamically stable.  Most recent hemoglobin prior to admission was 10.1 on 12/31; prior to this baseline ranged from 8-9.  Admission hemoglobin was 9.2.  Repeat was stable at 8.8. TSAT of 7%.  BMP was unremarkable.  CT abdomen pelvis was stable as compared to CT from 12/10.  Findings include similar gaseous distention of small bowel loops adjacent to the ill-defined infiltrative mesenteric lesion in the inferior abdomen, with no high-grade bowel obstruction.    Her last admission was 12/6 to 12/13.  She presented at that time with bright red blood per rectum.  Hemoglobin had gone from 11-7.6.  Bleeding prior to tjis admission Previous hospitalizations bleeding was attributed to anal fissures.  Pouchoscopy on 12/7 showed ulceration at the ileal anal anastomosis.  This was treated with APC.  Prior to this admission she had been on Eliquis.  This had been used prophylactically in the setting of a previous line associated clot (new [place]).      She had also previously been on sorafenib for treatment of her desmoid tumors.  Per hematology, sorafenib can cause a variety of side effects, including bleeding and fistulas; plan was to hold further dosing. Last dose was around 12/21.     In addition to the above, she also repots ongoing abdominal discomfort. She has experienced concurrent nausea and some vomiting.     -I have reviewed the patient's prior records from [facility] as summarized in the HPI    Objective:   Temp:  [36.5 ??C (97.7 ??F)] 36.5 ??C (97.7 ??F)  Heart Rate:  [71-91] 71  SpO2 Pulse:  [71-80] 77  Resp:  [13-17] 17  BP: (107-135)/(48-64) 115/64  SpO2:  [95 %-99 %] 99 %    Gen: WDWN female in NAD, answers timid and at times anxious, questions appropriately  Abdomen: Soft, lidocaine patches over bilateral low quadrants, no rebound/guarding, no hepatosplenomegaly appreciated   Extremities: No edema in the BLEs    Pertinent Labs/Studies:     Pouchoscopy 02/13/2022   Anal fissures, ileocolonic anastomosis with single circumferential ulcer (10mm in length) with oozing treated with APC  Path: severely active pouchitis, with ulcerations mild glandular architecture distortion, and focal adenomatous change     Pouchoscopy 12/09/2021   Ileocolonic anastomosis with 3mm ulcer with no bleeding, j pouch contained a single 3mm ulcer with no bleeding     Pouchoscopy 09/04/2021   Normal exam     EGD 09/05/2021   Normal esophagus, multiple sessile fundic gland polyps with no bleeding. Duodenum was normal.   A: Duodenum, biopsy  - Adenomatous polyp (1 fragment)  - No high grade dysplasia or carcinoma identified  - Separate fragments of duodenal mucosa with mild-moderate villous blunting  - No increased intraepithelial lymphocytes identified  B: Stomach, biopsy  - Gastric fundic mucosa with chronic superficial gastritis  - Gastric antral mucosa with erosive minimally active chronic superficial gastritis and slight reactive foveolar hyperplasia  - No Helicobacter pylori identified on H&E stain  - No intestinal metaplasia or dysplasia identified     C: Stomach, polypectomy  - Polypoid fragments of gastric fundic mucosa with features suggestive of early fundic gland polyps  - No intestinal metaplasia or dysplasia identified    Pouchoscopy 01/23/2021   Mild rectal stricture dilated with index finger and hegar dilated to 20mm   Rectal cuff with congestion

## 2022-03-11 NOTE — Unmapped (Signed)
PARENTERAL NUTRITION CONSULTATION NOTE     Requesting Attending Physician :  [name], MD    Reason for Consult:    [name] [name] is a 22 y.o. female seen in consultation at the request of Dr. [name], MD for evaluation of  parenteral nutrition.    NUTRITION ASSESSMENT     Anthropometric Data:  Height: 165.1 cm (5' 5)   Admission weight: 58.5 kg (129 lb)  Last recorded weight: 58.8 kg (129 lb 9.6 oz)  IBW: 56.75 kg  Percent IBW: 103.59 %  BMI: Body mass index is 21.57 kg/m??.   Usual Body Weight:  ~118#     Weight history prior to admission:  pt/mom are still concerned about fluid overload despite home tpn volume only being 1260 mL    Wt Readings from Last 10 Encounters:   03/11/22 58.8 kg (129 lb 9.6 oz)   02/28/22 60.3 kg (133 lb)   02/26/22 59.5 kg (131 lb 1.6 oz)   02/19/22 58.9 kg (129 lb 14.4 oz)   02/06/22 55.8 kg (123 lb)   01/29/22 57.4 kg (126 lb 9.6 oz)   01/28/22 53.8 kg (118 lb 8 oz)   01/20/22 51.3 kg (113 lb)   01/02/22 52.3 kg (115 lb 6.4 oz)   12/31/21 52.2 kg (115 lb 1.6 oz)        Weight changes this admission:   Last 5 Recorded Weights    03/09/22 1944 03/11/22 0253   Weight: 58.5 kg (129 lb) 58.8 kg (129 lb 9.6 oz)        Nutrition Focused Physical Exam:  Nutrition Focused Physical Exam:                        Nutrition Evaluation  Overall Impressions: Nutrition-Focused Physical Exam not indicated due to lack of malnutrition risk factors. (03/11/22 1221)  Nutrition Designation: Normal weight (BMI 18.50 - 24.99 kg/m2) (03/11/22 1221)      NUTRITIONALLY RELEVANT DATA     Parenteral Nutrition Formula:  Recent TPN Orders (Show up to 1 orders ; newest on the right.)       Start date and time  03/12/2022 0000       Parenteral Nutrition (CENTRAL) [[id number]]       linked to   fat emulsion 20 % with fish oil (SMOFlipid) infusion 250 mL     Order Status Active     Frequency Continuous        Macro Ingredients    amino acid (CLINISOL SF) 15% 75 g     dextrose 150 g     fat emulsion 20 % with fish oil 250 mL *        Electrolytes    sodium acetate 90 mEq     potassium acetate 40 mEq     potassium phosphate 30 mmol     magnesium sulfate dilution 4 mEq     calcium gluconate 10 mEq        Additives    multivitamin, adult injection 10 mL     trace elements (TRALEMENT) Zn-Cu-Mn-Se 1 mL        Medications    thiamine 180 mg        QS Base    sterile water 374.43 mL        Energy Contribution    Proteins 300 kcal     Dextrose 510 kcal     Lipids 500 kcal *  Total 1,310 kcal        Electrolyte Ion Calculated Amount    Sodium 90 mEq     Potassium 84 mEq     Calcium 10 mEq     Magnesium 4 mEq     Aluminum --     Phosphate 30 mmol     Chloride --     Acetate 130 mEq     Chloride: Acetate Ratio --        Trace Elements    Copper 0.3 mg     Manganese 55 mcg     Selenium 60 mcg     Zinc 3 mg        Other    Total Amino Acid 75 g     Total Amino Acid/kg 1.28 g/kg     Glucose Infusion Rate 1.77 mg/kg/min     Osmolarity 1,489.73     Volume 1,200 mL     Rate 50 mL/hr     Dosing Weight 58.8 kg     Infusion Site Central     Total Multi-vitamins 10 mL        Admin Instructions      Please infuse using a parenteral nutrition tubing set (non-DEHP) with a 1.2-micron filter. Listed in [place] as Set IV for Parenteral Nutrition # [id number].  central line only           * Lipid contribution from the linked fat emulsion order. Any non-lipid contribution from the associated lipid order is not included.              Labs:   Results in Past 7 Days  Result Component Current Result   Sodium 142 (03/11/2022)   Potassium 3.6 (03/11/2022)   Chloride 110 (H) (03/11/2022)   CO2 27.0 (03/11/2022)   BUN 10 (03/11/2022)   Creatinine 0.55 (03/11/2022)   Glucose 94 (03/11/2022)   Calcium 8.9 (03/11/2022)   Ionized Calcium Not in Time Range   Magnesium 2.4 (03/11/2022)   Phosphorus Not in Time Range   Albumin 3.8 (03/09/2022)   Total Bilirubin 0.3 (03/09/2022)   Bilirubin, Direct Not in Time Range   AST 20 (03/09/2022)   ALT 12 (03/09/2022) Alkaline Phosphatase 48 (03/09/2022)   Triglycerides Not in Time Range         Nutrition History:   March 11, 2022: Prior to admission:  Visisted pt at bedside, mom present. They remain concerned about fluid overload despite tpn being maximally concentrated. Last infused TPN Fri-Sat (12/29-12/30) - recently, tpn was made 5 days/week. Starting at continuous here.     Allergies, Intolerances, Sensitivities, and/or Cultural/Religious Dietary Restrictions: include lasix, iv iron     Current Nutrition:  Parenteral nutrition via PICC   Placement Date: 01/10/22   Placed by: Venous Access Team  Double lumen  Tip located in the Appropriate Tip Location Confirmed by [employer] and [employer] for use by the Venous Access Team (VAT)          Nutritional Needs:   Daily Estimated Nutrient Needs:  Energy: 1425-1710 kcals 25-30 kcal/kg using ideal body weight, 57 kg (03/11/22 1224)]  Protein: 57-86 gm [1.0-1.2 gm/kg, 1.2-1.5 gm/kg using ideal body weight, 57 kg (03/11/22 1224)]  Carbohydrate:   [no restriction]  Fluid: pt requests </= 1200 mL/day total mL [per MD team]      Malnutrition Assessment using AND/ASPEN Clinical Characteristics:    Patient does not meet AND/ASPEN criteria for malnutrition at this time (03/11/22 1226)  GOALS and EVALUATION     Patient to meet 80% or greater of nutritional needs via parenteral nutrition within 5 days.  - New    Motivation, Barriers, and Compliance:  Evaluation of motivation, barriers, and compliance completed. No concerns identified at this time.     NUTRITION ASSESSMENT     Patient appropriate for parenteral nutrition based on the following ASPEN criteria of lack of gut function .      Discharge Planning:   Monitor for potential discharge needs with multi-disciplinary team.       NUTRITION INTERVENTIONS and RECOMMENDATION     Renew TPN daily   Will cycle prior to discharge once tolerating at goal rate     Follow-Up Parameters:   Monitor daily while on TPN    Nutrition assessment completed by:  Forestine Chute MPH, RD, CNSC, LDN   Pager: [phone]  Phone: [phone]  [email or web address]     I was available via phone/pager. Lauretta Grill, MD

## 2022-03-11 NOTE — Unmapped (Addendum)
[ ]  start lexapro outpatient    22 yo F with [name] s/p proctocolectomy w/ ileoanal anastomosis and desmoid tumors on sorafenib admitted for GI bleeding 2/2 circumferential ulcer at ileoanal anastamosis s/p APC by GI. GI bleeding resolved however post procedure course c/b pain control. Pain team consulted. Discharge home with Butrans patch and oral oxycodone PRN. Sorafenib held during admission as felt to be the culprit behind the GIB. Follow up with oncology/GI outpatient.     GI Bleed - BRBPR - Acute on chronic anemia   Patient presented with bright red blood per rectum x 7 days and notable hemoglobin drop in outpatient labs from 11 to 7.6. She had recent hospitalization for similar presentation where bleeding was attributed to anal fissures. She underwent pouchoscopy with GI on 12/7 which showed bleeding ulcer at ileoanal anastamosis. Per discussion with Oncology and Gastroenterology, her current bleeding was thought to be related to sorafenib resumption in the setting of her known ulcer. Sorafenib was stopped and the BRBPR slowly resolved over the course of her hospital stay. Her hemoglobin held stably around 9.0 and was *** on day of dishcarge.     Chronic abdominal pain 2/2 desmoid tumors - presumed SIBO - Narcotic bowel syndrome vs Ileus   Presenting with worsening acute on chronic abdominal pain. Having LLQ > RLQ pain (baseline pain typically only in LLQ) Follows with pain mgmt as an outpatient for her chronic abdominal pain.  Etiology is not completely clear.  The differential includes tethering of her bowel secondary to the midline mesenteric desmoid that could be contributed to partial SBO/ileus, however we suspect that this was not the only contributor.  CT abdomen pelvis on admission with stable findings compared to prior CT on 12/10; similar gaseous distention of the small bowel loops adjacent to the ill-defined infiltrative mesenteric lesion in the inferior abdomen, no obstruction, similar appearance of multiple mesenteric lesions. Suspected there may be component of SIBO in setting of chronic cefdinir use, so per recommendation of GI she was started on a two week course of oral ciprofloxacin. Pain management was a challenge during this hospital stay as pt required daily IV and oral opiate pain medications. Pain Medicine was consulted and provided recommendations to augment her pain control with baclofen, gabapentin, Tylenol, Lidocaine patches, Voltaren, anti-emetics, and psychiatric medications (Zyprexa). Despite this, after weaning off of IV Dilaudid on 1/5, her pain returned and she required IV medications for 10/10 pain. At that time, a KUB re demonstrated gas filled loops of bowel in the lower pelvis c/f ileus vs mild obstruction. CT A/P with oral contrast was stable and most suggestive of ileus at that time. She was made NPO and an NGT was placed for decompression. She had a bowel movement on 1/6 and subsequently began to take more by mouth.  Eventually the NG tube was removed and she began to take more by mouth.  Despite the resolving ileus, pain continued to be a limiting factor and as such pain medicine recommended starting a lidocaine infusion for 3 days.  She tolerated the lidocaine infusion well and experienced some relief of her pain while it infused.  Once it stopped her pain returned and she was subsequently initiated on a Butrans patch while her IV Dilaudid was weaned.  She was then weaned off her IV pain regimen and discharged home on ***     Severe Malnutrition 2/2 [name] and abdominal desmoid tumors   On previous admission she was discharged with a Corpak and was  ultimately transitioned to TPN.  She is currently receiving TPN every day and this is being followed by Dr. [name], outpatient GI. Received daily TPN while inpatient. She resumed a low residue diet while in the hospital and was tolerating small volume oral intake by day of discharge.      [name]/Desmoid Tumors   Thoughtful note on 03/11/22 from Dr. [name]. Holding sorafenib for the foreseeable future. Looking into starting nirogacestat as no known bleeding risk. Believes that mesenteric lymph nodes are reactive in the setting of TPN, pouchitis/ulcer, malnutrition, and recurrent GIB. Their fertility counsellor is aware of the potential switch to nirogacestat and will be reaching out to talk with pt and her mother. She will follow up closely with Oncology as an outpatient.     Anxiety   Anxiety played a substantial component throughout her hospital stay.  Her presentation included the presence of multiple worries that were difficult to control, sleep disturbances, fatigue, low energy, impaired concentration that was felt to be consistent with generalized anxiety disorder in conjunction with her known major depressive disorder.  Psychiatry was consulted for medication recommendations and starting an SSRI.  Her anxiety was often heightened when thinking about timing of pain medications, making changes to her medication regimen, and when her pain flared.  In the hospital she was continued on Remeron 7.5 nightly, Zyprexa 7.5 mg nightly gabapentin 200 mg nightly.  While admitted she was initiated on Lexapro and as needed Atarax both of which she tolerated well.  She particularly found benefit from Atarax as needed and will continue that medicine as an outpatient.     Chronic Problems  Hx of Pouchitis   Patient has been on cefdinir for this and was recently decreased the dose by her GI doctor for prophylactic dosing. She follows with Dr. [name], GI.  Underwent pouchoscopy on 12/7 with biopsy revealing severely active pouchitis with ulceration. Cefdinir stopped inpatient and started on ciprofloxacin for SIBO.     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 Continued clobetasol 0.05%     Hx of PICC Line associated clot   She was treated for this back in January and given recent reinsertion of PICC line for TPN was started on a prophylactic dose of Eliquis. HELD in the setting of GIB *** ? Resume on discharge

## 2022-03-11 NOTE — Unmapped (Signed)
Internal Medicine (MEDW) History & Physical    Assessment & Plan:   [name] [name] is a 22 y.o. 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     Principal Problem:    GI bleeding  Active Problems:    [name]    [name]    Pain    Iron deficiency anemia due to chronic blood loss    Severe protein-calorie malnutrition (CMS-HCC)    Lower abdominal pain    Generalized anxiety disorder with panic attacks  Resolved Problems:    * No resolved hospital problems. *      Active Problems    Concern for GIB with Hematochezia I Acute on Chronic Anemia  Patient admitted for bright red blood per rectum for the past 6 to 7 days.  To note patient has had 2 recent admissions over the past month for similar presentations.  During most recent prior admission she underwent pouchoscopy with GI which showed bleeding ulcer at the ileoanal anastomosis which was coagulated with APC.  At discharge her hemoglobin was stable at 9.2 and her Eliquis was discontinued.  Fortunately on this admission she is hemodynamically stable, mild tachycardia on presentation to the ED that has now resolved.  Hgb overall stable (10.1 on 12/31 and 9.2 on 1/1).  Suspect her bleeding is likely from a lower GI source, possibly secondary to the known bleeding ulcer that was seen on her last pouchoscopy versus known anal fissures.  She has no hematemesis or melena to suspect upper GI bleed at this time. To note  patient's Soafenib was resumed on 12/22 which increases the risk of bleeding so suspect this may be an underlying etiology of her presentation as well.   - Consult GI luminal in the AM  - Repeat CBC now and then daily if remains clinically stable   - Obtain iron panel and ferritin   - Maintain active type and screen and adequate IV access with at least 2 large bore IV  - Transfuse Hgb <7  - Will hold off on IV PPI given low suspicion for UGIB  - Obtained blood consent on admission     Acute on Chronic Abdominal Pain 2/2 Desmoid Tumors   Presenting with worsening acute on chronic abdominal pain over the past several days.  Pain primarily in the RLQ and LLQ.  She follows with pain management as an outpatient for her chronic abdominal pain likely secondary to her desmoid tumors.  During her last admission she required inpatient chronic pain consult to help with weaning of IV pain medications.  CT abdomen pelvis on admission with stable findings compared to prior CT on 12/10; similar gaseous distention of the small bowel loops adjacent to the ill-defined infiltrative mesenteric lesion in the inferior abdomen, no obstruction, similar appearance of multiple mesenteric lesions.  On exam she has abdominal tenderness in the lower quadrants however no rebound or guarding.  Suspect most of her pain is related to her known tumors however I do have some concern that constipation may be playing a role in the setting of high-dose opiates as an outpatient although she does report having regular bowel movements at home.   - Discuss continued abdominal pain and stable imaging findings with GI and oncology in the AM  - Oxycodone 10 mg  q4hrs PRN moderate pain   - IV Dilaudid 1 mg IV q3hrs PRN breakthrough pain   - Continue Gabapentin 200 mg at bedtime  - Continue Baclofen 5mg  as needed  - Lidocaine patch and voltaren gel   - Consider inpatient chronic pain consult     Hx of Pouchitis   Patient has been on cefdinir for her pouchitis.  She follows with Dr. [name], GI.  Underwent pouchoscopy on 12/7 with biopsy revealing severely active pouchitis with ulceration  - Continue cefdinir 300 mg daily  - Discussed recent biopsy findings with GI in AM given patient has not been able to discuss with her outpatient GI provider    Severe Malnutrition 2/2 [name] and abdominal desmoid tumors   She is currently receiving TPN 5/7 days of the week and is being followed by Dr. [name], outpatient GI  - TPN consult in AM for resumption of TPN     [name]/Desmoid Tumors   Follows with Dr. [name], last seen 02/28/2022.  Per his note she has shown clear response/shrinkage of her desmoid tumors on her scans on 12/10 indicating treatment response to the sorafenib.   - Oncology consult vs communication with Dr. 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: recurring with re-initiation of sorafenib  -Continue clobetasol 0.05%       Issues Impacting Complexity of Management:  -Parenteral controlled medications: IV Dilaudid      Medical Decision Making: Discussed the patient's management and/or test interpretation with ED Provider as summarized within this note      Checklist:  Diet: Regular Diet  DVT PPx: Contraindicated - High Risk for Bleeding/Active Bleeding  Code Status: Full Code  Dispo: Patient appropriate for Observation based on expectation of ongoing need for hospitalization less than two midnights and/or low intensity of services provided    Team Contact Information:   Primary Team: Internal Medicine (MEDW)  Primary Resident: Linna Hoff, MD  Resident's Pager: [phone] [facility])    Chief Concern:   GI bleeding      Subjective:   [name] [name] is a 22 y.o. 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       History obtained by Cyndie Mull from patient and family .       HPI:  Ms. [name] presented after having about 6 to 7 days of bright red blood per rectum.  She states that on 12/27 she began noticing bright red blood in the toilet.  She admits that she was having multiple episodes of bleeding every time she would have a bowel movement each day.  He admits that the blood will fill the toilet and is not mixed with her stool.  Her stool still looks brown.  Last episode of bright red blood per rectum was on 1/1 AM.  Over the past several days she has also had worsening abdominal pain.  Pain is described as a stabbing pain in the right and left lower quadrants.  Pain is worse with  walking and improves when lying down in bed.  She has been taking her oxycodone on prescribed by her pain management doctor.  She has been having to take 15 mg every 4 hours due to her pain however it is still not been controlled.  She admits that she is having regular bowel movements however since coming to the ED she has only had 1 bowel movement.  She denies any recent fevers however admits that she has been feeling hot with the worsening abdominal pain.  No vomiting but does have occasional nausea.     To note patient was recently discharged on 12/13 for the GI bleed.  During this admission she underwent pouchoscopy and was found to have circumferential ulcer at the ileoanal anastomosis with APC performed by GI.  On discharge her hemoglobin was stable at 9.2 and she was having no further signs of bleeding.  Patient followed up with her outpatient oncologist on 12/22 and her chemotherapy regimen, sorafenib, was restarted.    On admission, patient was afebrile, heart rate 71, respiratory rate 17, BP 115/64, and saturating at 99% on room air.  Most recent hemoglobin 9.2 from 10.1 on day of presentation to the ED.  CT abdomen pelvis showed similar gaseous distention of small bowel loops adjacent to the ill-defined infiltrative mesenteric lesion in the inferior abdomen, stable findings from prior.  Patient boarded in the ED for greater than 24 hours and did receive several doses of IV Dilaudid for her pain which she states has been helping.    Pertinent Surgical Hx  Past Surgical History:   Procedure Laterality Date    cyroablation      cystis removal      desmoid removal      PR CLOSE ENTEROSTOMY,RESEC+ANAST N/A 10/09/2020    Procedure: ILEOSTOMY TAKEDOWN;  Surgeon: Mickle Asper, MD;  Location: OR [place];  Service: General Surgery    PR COLONOSCOPY W/BIOPSY SINGLE/MULTIPLE N/A 10/27/2012    Procedure: COLONOSCOPY, FLEXIBLE, PROXIMAL TO SPLENIC FLEXURE; WITH BIOPSY, SINGLE OR MULTIPLE;  Surgeon: Shirlyn Goltz Mir, MD;  Location: PEDS PROCEDURE ROOM [facility];  Service: Gastroenterology    PR COLONOSCOPY W/BIOPSY SINGLE/MULTIPLE N/A 09/14/2013    Procedure: COLONOSCOPY, FLEXIBLE, PROXIMAL TO SPLENIC FLEXURE; WITH BIOPSY, SINGLE OR MULTIPLE;  Surgeon: Shirlyn Goltz Mir, MD;  Location: PEDS PROCEDURE ROOM [facility];  Service: Gastroenterology    PR COLONOSCOPY W/BIOPSY SINGLE/MULTIPLE N/A 11/08/2014    Procedure: COLONOSCOPY, FLEXIBLE, PROXIMAL TO SPLENIC FLEXURE; WITH BIOPSY, SINGLE OR MULTIPLE;  Surgeon: Arnold Long Mir, MD;  Location: PEDS PROCEDURE ROOM [facility];  Service: Gastroenterology    PR COLONOSCOPY W/BIOPSY SINGLE/MULTIPLE N/A 12/26/2015    Procedure: COLONOSCOPY, FLEXIBLE, PROXIMAL TO SPLENIC FLEXURE; WITH BIOPSY, SINGLE OR MULTIPLE;  Surgeon: Arnold Long Mir, MD;  Location: PEDS PROCEDURE ROOM [facility];  Service: Gastroenterology    PR COLONOSCOPY W/BIOPSY SINGLE/MULTIPLE N/A 09/02/2017    Procedure: COLONOSCOPY, FLEXIBLE, PROXIMAL TO SPLENIC FLEXURE; WITH BIOPSY, SINGLE OR MULTIPLE;  Surgeon: Arnold Long Mir, MD;  Location: PEDS PROCEDURE ROOM [place];  Service: Gastroenterology    PR COLSC FLX W/REMOVAL LESION BY HOT BX FORCEPS N/A 08/27/2016    Procedure: COLONOSCOPY, FLEXIBLE, PROXIMAL TO SPLENIC FLEXURE; W/REMOVAL TUMOR/POLYP/OTHER LESION, HOT BX FORCEP/CAUTE;  Surgeon: Arnold Long Mir, MD;  Location: PEDS PROCEDURE ROOM [facility];  Service: Gastroenterology    PR COLSC FLX W/RMVL OF TUMOR POLYP LESION SNARE TQ N/A 02/25/2019    Procedure: COLONOSCOPY FLEX; W/REMOV TUMOR/LES BY SNARE;  Surgeon: Helyn Numbers, MD;  Location: GI PROCEDURES [place]  [facility];  Service: Gastroenterology    PR COLSC FLX W/RMVL OF TUMOR POLYP LESION SNARE TQ N/A 03/13/2020    Procedure: COLONOSCOPY FLEX; W/REMOV TUMOR/LES BY SNARE;  Surgeon: Helyn Numbers, MD;  Location: GI PROCEDURES [place] [facility];  Service: Gastroenterology    PR EXC SKIN BENIG 2.1-3 CM TRUNK,ARM,LEG Right 02/25/2017    Procedure: EXCISION, BENIGN LESION INCLUDE MARGINS, EXCEPT SKIN TAG, LEGS; EXCISED DIAMETER 2.1 TO 3.0 CM;  Surgeon: Clarene Duke, MD;  Location: CHILDRENS OR [facility];  Service: Plastics    PR EXC SKIN BENIG 3.1-4 CM TRUNK,ARM,LEG Right 02/25/2017    Procedure: EXCISION, BENIGN LESION INCLUDE MARGINS, EXCEPT SKIN TAG, ARMS; EXCISED DIAMETER 3.1 TO 4.0 CM;  Surgeon: Clarene Duke, MD;  Location: CHILDRENS OR [facility];  Service: Plastics    PR EXC SKIN BENIG >4 CM FACE,FACIAL Right 02/25/2017    Procedure: EXCISION, OTHER BENIGN LES INCLUD MARGIN, FACE/EARS/EYELIDS/NOSE/LIPS/MUCOUS MEMBRANE; EXCISED DIAM >4.0 CM;  Surgeon: Clarene Duke, MD;  Location: CHILDRENS OR [facility];  Service: Plastics    PR EXC TUMOR SOFT TISSUE LEG/ANKLE SUBQ 3+CM Right 08/05/2019    Procedure: EXCISION, TUMOR, SOFT TISSUE OF LEG OR ANKLE AREA, SUBCUTANEOUS; 3 CM OR GREATER;  Surgeon: Arsenio Katz, MD;  Location: MAIN OR [place];  Service: Plastics    PR EXC TUMOR SOFT TISSUE LEG/ANKLE SUBQ <3CM Right 08/05/2019    Procedure: EXCISION, TUMOR, SOFT TISSUE OF LEG OR ANKLE AREA, SUBCUTANEOUS; LESS THAN 3 CM;  Surgeon: Arsenio Katz, MD;  Location: MAIN OR [facility];  Service: Plastics    PR LAP, SURG PROCTECTOMY W J-POUCH N/A 08/10/2020    Procedure: ROBOTIC ASSISTED LAPAROSCOPIC PROCTOCOLECTOMY, ILEAL J POUCH, WITH OSTOMY;  Surgeon: Mickle Asper, MD;  Location: OR [place];  Service: General Surgery    PR NDSC EVAL INTSTINAL POUCH DX W/COLLJ SPEC SPX N/A 01/23/2021    Procedure: ENDO EVAL SM INTEST POUCH; DX;  Surgeon: Modena Nunnery, MD;  Location: GI PROCEDURES [place] [facility];  Service: Gastroenterology    PR NDSC EVAL INTSTINAL POUCH DX W/COLLJ SPEC SPX N/A 08/27/2021    Procedure: ENDO EVAL SM INTEST POUCH; DX;  Surgeon: Hunt Oris, MD;  [facility];  Service: Gastroenterology    PR NDSC EVAL INTSTINAL POUCH DX W/COLLJ SPEC SPX N/A 12/09/2021    Procedure: ENDO EVAL SM INTEST POUCH; DX;  Surgeon: Vidal Schwalbe, MD;  Location: GI PROC[facility]URES [facility];  Service: Gastroenterology    PR NDSC EVAL INTSTINAL POUCH W/BX SINGLE/MULTIPLE N/A 01/20/2022    Procedure: ENDOSCOPIC EVAL OF SMALL INTESTINAL POUCH; DIAGNOSTIC, No biopsies;  Surgeon: Andrey Farmer, MD;  Location: GI PROC[facility]URES [facility];  Service: Gastroenterology    PR NDSC EVAL INTSTINAL POUCH W/BX SINGLE/MULTIPLE N/A 02/13/2022    Procedure: ENDOSCOPIC EVAL OF SMALL INTESTINAL POUCH; DIAGNOSTIC, WITH BIOPSY;  Surgeon: Bronson Curb, MD;  [facility]: GI PROC[facility]URES [facility];  Service: Gastroenterology    PR UNLISTED PROCEDURE SMALL INTESTINE  01/23/2021    Procedure: UNLISTED PROCEDURE, SMALL INTESTINE;  Surgeon: Modena Nunnery, MD;  Location: GI PROCEDURES [place] [facility];  Service: Gastroenterology    PR UNLISTED PROCEDURE SMALL INTESTINE  02/13/2022    Procedure: UNLISTED PROCEDURE, SMALL INTESTINE;  Surgeon: Bronson Curb, MD;  Location: GI PROC[facility]URES [facility];  Service: Gastroenterology    PR UPPER GI ENDOSCOPY,BIOPSY N/A 10/27/2012    Procedure: UGI ENDOSCOPY; WITH BIOPSY, SINGLE OR MULTIPLE;  Surgeon: Shirlyn Goltz Mir, MD;  Location: PEDS PROCEDURE ROOM [place];  Service: Gastroenterology    PR UPPER GI ENDOSCOPY,BIOPSY N/A 09/14/2013 Procedure: UGI ENDOSCOPY; WITH BIOPSY, SINGLE OR MULTIPLE;  Surgeon: Shirlyn Goltz Mir, MD;  Location: PEDS PROCEDURE ROOM [facility];  Service: Gastroenterology    PR UPPER GI ENDOSCOPY,BIOPSY N/A 11/08/2014    Procedure: UGI ENDOSCOPY; WITH BIOPSY, SINGLE OR MULTIPLE;  Surgeon: Arnold Long Mir, MD;  Location: PEDS PROCEDURE ROOM [facility];  Service: Gastroenterology    PR UPPER GI ENDOSCOPY,BIOPSY N/A 12/26/2015    Procedure: UGI ENDOSCOPY; WITH BIOPSY, SINGLE OR MULTIPLE;  Surgeon: Arnold Long Mir, MD;  Location: PEDS PROCEDURE ROOM [facility];  Service: Gastroenterology    PR UPPER GI ENDOSCOPY,BIOPSY N/A 08/27/2016    Procedure: UGI ENDOSCOPY; WITH BIOPSY, SINGLE OR MULTIPLE;  Surgeon: Arnold Long Mir, MD;  Location: PEDS PROCEDURE ROOM [facility];  Service: Gastroenterology    PR UPPER GI ENDOSCOPY,BIOPSY N/A 09/02/2017    Procedure: UGI ENDOSCOPY; WITH BIOPSY, SINGLE OR MULTIPLE;  Surgeon: Arnold Long Mir, MD;  Location: PEDS PROCEDURE ROOM [facility];  Service: Gastroenterology    PR UPPER GI ENDOSCOPY,BIOPSY N/A 03/13/2020    Procedure: UGI ENDOSCOPY; WITH BIOPSY, SINGLE OR MULTIPLE;  Surgeon: Helyn Numbers, MD;  Location: GI PROCEDURES [place] [facility];  Service: Gastroenterology    PR UPPER GI ENDOSCOPY,BIOPSY N/A 09/05/2021    Procedure: UGI ENDOSCOPY; WITH BIOPSY, SINGLE OR MULTIPLE;  Surgeon: Wendall Papa, MD;  [facility]: [facility]URES [facility];  Service: Gastroenterology    PR UPPER GI ENDOSCOPY,DIAGNOSIS N/A 01/20/2022    Procedure: UGI ENDO, INCLUDE ESOPHAGUS, STOMACH, & DUODENUM &/OR JEJUNUM; DX W/WO COLLECTION SPECIMN, BY BRUSH OR WASH;  Surgeon: Andrey Farmer, MD;  Location: GI PROCEDURES [facility];  Service: Gastroenterology    TUMOR REMOVAL      multiple-head, neck, back, hand, right flank, multiple       Pertinent Family Hx  Family History   Problem Relation Age of Onset    No Known Problems Mother     No Known Problems Father     No Known Problems Sister     No Known Problems Brother     Stroke Maternal Grandmother     Other Maternal Grandmother         benign lesions of liver and pancreas, further details unknown    Cancer Maternal Grandmother     Diabetes Maternal Grandmother     Hypertension Maternal Grandmother     Thyroid disease Maternal Grandmother     Arthritis Maternal Grandfather     Asthma Maternal Grandfather     COPD Paternal Grandmother         Deceased    Miscarriages / Stillbirths Paternal Grandmother     Alcohol abuse Paternal Grandfather         Deceased    No Known Problems Maternal Aunt     No Known Problems Maternal Uncle     No Known Problems Paternal Aunt     No Known Problems Paternal Uncle     Anesthesia problems Neg Hx     Broken bones Neg Hx     Cancer Neg Hx     Clotting disorder Neg Hx     Collagen disease Neg Hx     Diabetes Neg Hx     Dislocations Neg Hx     Fibromyalgia Neg Hx     Gout Neg Hx     Hemophilia Neg Hx     Osteoporosis Neg Hx     Rheumatologic disease Neg Hx     Scoliosis Neg Hx  Severe sprains Neg Hx     Sickle cell anemia Neg Hx     Spinal Compression Fracture Neg Hx     Melanoma Neg Hx     Basal cell carcinoma Neg Hx     Squamous cell carcinoma Neg Hx          Pertinent Social Hx   Lives at home with parents       Allergies  Adhesive tape-silicones; Ferrlecit [sodium ferric gluconat-sucrose]; Levofloxacin; Methylnaltrexone; Neomycin; Papaya; Morphine; Zosyn [piperacillin-tazobactam]; Compazine [prochlorperazine]; and Latex, natural rubber    I reviewed the Medication List. The current list is Accurate  Prior to Admission medications    Medication Dose, Route, Frequency   acetaminophen (TYLENOL) 500 MG tablet 1,000 mg, Oral, Every 8 hours   ammonium lactate (LAC-HYDRIN) 12 % lotion 1 Application, Topical, 2 times a day (standard)   baclofen (LIORESAL) 5 mg Tab tablet 5-10 mg, Oral, 2 times a day PRN   buprenorphine 10 mcg/hour PTWK transdermal patch 1 patch, Transdermal, Every 7 days   cefdinir (OMNICEF) 300 MG capsule 300 mg, Oral, Daily (standard)   clobetasoL (TEMOVATE) 0.05 % ointment Topical, 2 times a day (standard), To stubborn/thick rashes on hands/feet ears until clear/smooth. Restart as needed   diclofenac sodium (VOLTAREN) 1 % gel 2 g, Topical, 4 times a day   diphenhydrAMINE (BENADRYL) 50 mg capsule 50 mg, Oral, Every 6 hours PRN   famotidine (PEPCID) 20 MG tablet 20 mg, Oral, Nightly   gabapentin (NEURONTIN) 100 MG capsule 200 mg, Oral, Nightly   hyoscyamine (LEVSIN) 0.125 mg tablet 0.125 mg, Oral, Every 4 hours PRN   lidocaine (LIDODERM) 5 % patch 1 patch, Transdermal, Every 24 hours, Apply to affected area for 12 hours only each day (then remove patch)   lidocaine 2% viscous (LIDOCAINE) 2 % Soln 15 mL, Mouth, Every 4 hours PRN   menthol-zinc oxide (CALMOSEPTINE) 0.44-20.6 % Oint Topical, 4 times a day PRN   mirtazapine (REMERON) 7.5 MG tablet 7.5 mg, Oral, Nightly   naloxone (NARCAN) 4 mg nasal spray One spray in either nostril once for known/suspected opioid overdose. May repeat every 2-3 minutes in alternating nostril til EMS arrives   OLANZapine (ZYPREXA) 7.5 MG tablet TAKE 1 TABLET BY MOUTH EVERY NIGHTLY   ondansetron (ZOFRAN-ODT) 8 MG disintegrating tablet 8 mg, Oral, Every 12 hours PRN   oxyCODONE (ROXICODONE) 10 mg immediate release tablet 10-15 mg, Oral, Every 4 hours PRN, Fill on or after: 02/26/22   oxyCODONE (ROXICODONE) 5 MG immediate release tablet 5 mg, Oral, Every 4 hours PRN   pimecrolimus (ELIDEL) 1 % cream Topical, 2 times a day (standard), To face as needed for rash   silver sulfaDIAZINE (SILVADENE) 1 % cream Topical, Daily (standard), To burn on leg   SLEEPY BUTTER Swish and spit 10 mL 4 times a day as needed.   sodium/pot/mag/calc/chlor/acet (TPN ELECTROLYTES IV) Receiving IV TPN   triamcinolone (KENALOG) 0.1 % cream Topical, 2 times a day (standard)   zinc oxide 10 % Crea 1 application., Topical, Daily PRN       Designated Healthcare Decision Maker:  Ms. [name] currently has decisional capacity for healthcare decision-making and is able to designate a surrogate healthcare decision maker. Ms. Idelle Leech designated healthcare decision maker(s) is/are Trey Paula and Lavetta Nielsen  (the patient's parent) as denoted by stated patient preference.    Objective:   Physical Exam:  Temp:  [36.5 ??C (97.7 ??F)-36.7 ??C (98 ??F)] 36.5 ??C (97.7 ??F)  Heart Rate:  [  71-91] 71  SpO2 Pulse:  [71-109] 77  Resp:  [13-17] 17  BP: (107-135)/(48-70) 115/64  SpO2:  [95 %-99 %] 99 %    Gen: young female sitting up in bed in NAD, converses   Eyes: Sclera anicteric, EOMI grossly normal   HENT: Atraumatic, normocephalic  Neck: Trachea midline  Heart: RRR  Lungs: CTAB, no crackles or wheezes  Abdomen: soft, firm tumor palpated in LLQ, tenderness in RLQ and LLQ, no rebound or guarding, no peritoneal signs, BS present   Extremities: No edema  Neuro: Grossly symmetric, non-focal    Skin:  No rashes, lesions on clothed exam  Psych: Alert, oriented    Carolanne Grumbling, MD, [facility]  [facility] Internal Medicine PGY-2  Pager: [phone]

## 2022-03-11 NOTE — Unmapped (Cosign Needed)
Department of Anesthesiology  Pain Medicine Division    Chronic Pain Consult Note      Requesting Attending Physician:  [name], MD  Service Requesting Consult:  Med [facility] (MDW)    Assessment/Recommendations:  The patient was seen in consultation on request of [name], MD regarding assistance with pain management. The patient is obtaining adequate pain relief on current medication regimen.     The patient is a 22 y.o. 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 Chronic pain team has been consulted to assist with medication management as patient does see outpatient pain provider at [facility] pain clinic.      At this time, patient states that she has been having pain for the past 5 days.  Patient reports this pain is worse than her normal pain.  Patient states at home she was taking oxycodone 10 to 15 mg every 4 hours however this was not touching her pain.  She states at this time she is receiving IV Dilaudid every 3 hours which is helping her along with her heating pad.  We did also discuss starting a Butrans patch on the patient however she stated that she is not ready for this at this time.  She states she would like her pain to be well-controlled first before starting any new medications. Patient and her mother are curious to see if her current distention is related to any infection or mesenteric changes in her abdomen. We discussed that GI would be the best team to ask as they would know more. Once GI does evaluate the patient for any acute GI pathology, we may be able to titrate her medications.      Recommendations:  -The chronic pain service is a consult service and does not place orders, just makes recommendations (except ketamine and lidocaine infusions)  -Please evaluate all patients on opioids for appropriateness of prescribing narcan at discharge.  The chronic pain service can assist with this.  Nasal narcan covered by most insurance.  -Recommendations given apply to the current hospitalization and do not reflect long term recommendations.  - Continue Tylenol 1 g q8h  - Continue Baclofen 5 mg TID  - Continue HM IV 1 mg q3h PRN  - Continue Lidocaine patch  - Encourage use of PO Oxycodone 10 mg q4h PRN     Home MME: 135  Current MME: 130    We will continue to follow.    Medications trialed during this admission (or pertinent in past): see below    Naloxone Rx at discharge?  Is patient on opioids? Yes.  1)Is dose >50MME?  Yes.  2) Is patient prescribed a benzodiazepine (w opioids)? No.  3)Hx of overdose?  No.  4) Hx of substance use disorder? No.  5) Opioids likely to last greater than a week after discharge? Yes.    If yes to 2 or more, prescribe naloxone at discharge.  Nasal narcan for most insured (Nasal narcan 4mg /actuation, prescribe 1 kit, instructions at [email or web address]).  For uninsured, chronic pain can work to assist in finding an option.  OTC nasal narcan now available at most pharmacies for around $45.    History of Present Illness:    Reason for Consult:  acute on chronic pain     [name] [name] is seen in consultation at the request of Med W team for  evaluation of acute on chronic pain.  Patient states she started to have abdominal pain for the past week when she started to have blood leaking from her GI system.  Patient states her pain was exacerbated by this and her left and right lower abdomen.  Patient states that she was taking her oxycodone 10 to 15 mg every 4 hours at home however this was not touching her pain.  Patient states that she eventually came into the hospital and was found to be anemic.  At this time patient states that the IV Dilaudid and heating pad are helping her with pain control.  Patient's mom also states that they were in the ED for a long period of time so they have not rested until 3 AM this morning.  Patient's mom asks about a recurrent area of distention and patient's abdomen.  She states she is wondering why this keeps happening at the same spot and if there is an infection or some kind of mesentery at this location that is causing her symptoms.    For the pain, the patient's current medication regimen includes:  Tylenol 1 g q8h  Baclofen 5 mg TID  HM IV 1 mg q3h PRN  Lidocaine patch  Oxycodone 10 mg q4h PRN      Prior to admission, the patient was on home opiate medications.  Shehas been followed by a pain clinic.  The [place] CSRS was reviewed.    Allergies    Allergies   Allergen Reactions    Adhesive Tape-Silicones Hives and Rash     Paper tape  And tegederm ok    Ferrlecit [Sodium Ferric Gluconat-Sucrose] Swelling and Rash    Levofloxacin Swelling and Rash     Swelling in mouth, rash,     Methylnaltrexone      Per Patient: I lost bowel control, severe abdominal cramping, and elevated BP    Neomycin Swelling     Rxn after ear drops; ear swelling    Papaya Hives    Morphine Nausea And Vomiting    Zosyn [Piperacillin-Tazobactam] Itching and Rash     Red and itchy    Compazine [Prochlorperazine] Other (See Comments)     Extreme agitation    Latex, Natural Rubber Rash       Home Medications    Medications Prior to Admission   Medication Sig Dispense Refill Last Dose    acetaminophen (TYLENOL) 500 MG tablet Take 2 tablets (1,000 mg total) by mouth every eight (8) hours. 30 tablet 0     ammonium lactate (LAC-HYDRIN) 12 % lotion Apply 1 Application topically two (2) times a day.       baclofen (LIORESAL) 5 mg Tab tablet Take 1-2 tablets (5-10 mg total) by mouth two (2) times a day as needed for muscle spasms. 40 tablet 0     buprenorphine 10 mcg/hour PTWK transdermal patch Place 1 patch on the skin every seven (7) days. 4 patch 2     cefdinir (OMNICEF) 300 MG capsule Take 1 capsule (300 mg total) by mouth daily.       clobetasoL (TEMOVATE) 0.05 % ointment Apply topically two (2) times a day. To stubborn/thick rashes on hands/feet ears until clear/smooth. Restart as needed 60 g 3     diclofenac sodium (VOLTAREN) 1 % gel Apply 2 g topically four (4) times a day. 100 g 0     diphenhydrAMINE (BENADRYL) 50 mg capsule Take 1 capsule (50 mg total) by mouth every six (6) hours  as needed for itching (per patient preference (if prefers over hydroxyzine)). 30 each 0     famotidine (PEPCID) 20 MG tablet Take 1 tablet (20 mg total) by mouth nightly. 90 tablet 3     gabapentin (NEURONTIN) 100 MG capsule Take 2 capsules (200 mg total) by mouth nightly. 60 capsule 0     [EXPIRED] hydrocortisone (ANUSOL-HC) 25 mg suppository Insert 1 suppository (25 mg total) into the rectum two (2) times a day as needed for hemorrhoids (possible rectal/anal bleeding source). 30 suppository 0     hyoscyamine (LEVSIN) 0.125 mg tablet Take 1 tablet (0.125 mg total) by mouth every four (4) hours as needed for cramping.       lidocaine (LIDODERM) 5 % patch Place 1 patch on the skin daily. Apply to affected area for 12 hours only each day (then remove patch)       lidocaine 2% viscous (LIDOCAINE) 2 % Soln 15 mL by Mouth route every four (4) hours as needed. 100 mL 3     menthol-zinc oxide (CALMOSEPTINE) 0.44-20.6 % Oint Apply topically four (4) times a day as needed. 113 g 11     mirtazapine (REMERON) 7.5 MG tablet Take 1 tablet (7.5 mg total) by mouth nightly. 90 tablet 3     naloxone (NARCAN) 4 mg nasal spray One spray in either nostril once for known/suspected opioid overdose. May repeat every 2-3 minutes in alternating nostril til EMS arrives 2 each 0     [EXPIRED] NIFEdipine 0.3% lidocaine 1.5% in petrolatum ointment Apply topically two (2) times a day. 30 g 0     OLANZapine (ZYPREXA) 7.5 MG tablet TAKE 1 TABLET BY MOUTH EVERY NIGHTLY 90 tablet 1     ondansetron (ZOFRAN-ODT) 8 MG disintegrating tablet Take 1 tablet (8 mg total) by mouth every twelve (12) hours as needed for nausea.       oxyCODONE (ROXICODONE) 10 mg immediate release tablet Take 1-1.5 tablets (10-15 mg total) by mouth every four (4) hours as needed for pain. Fill on or after: 02/26/22 210 tablet 0     oxyCODONE (ROXICODONE) 5 MG immediate release tablet Take 1 tablet (5 mg total) by mouth every four (4) hours as needed for pain.       pimecrolimus (ELIDEL) 1 % cream Apply topically two (2) times a day. To face as needed for rash 30 g 11     silver sulfaDIAZINE (SILVADENE) 1 % cream Apply topically daily. To burn on leg 50 g 11     SLEEPY BUTTER Swish and spit 10 mL 4 times a day as needed. 300 mL 3     sodium/pot/mag/calc/chlor/acet (TPN ELECTROLYTES IV) Receiving IV TPN       triamcinolone (KENALOG) 0.1 % cream Apply topically two (2) times a day. 28 g 3     zinc oxide 10 % Crea Apply 1 application. topically daily as needed.          Inpatient Medications  Current Facility-Administered Medications   Medication Dose Route Frequency Provider Last Rate Last Admin    acetaminophen (TYLENOL) tablet 1,000 mg  1,000 mg Oral Q8H PRN Hollie Beach P, DO        baclofen (LIORESAL) tablet 5 mg  5 mg Oral TID PRN Linna Hoff, MD        cefdinir (OMNICEF) capsule 300 mg  300 mg Oral Q24H SCH Malinda, Benjamin P, DO   300 mg at 03/11/22 0802    clobetasol (TEMOVATE) 0.05 % ointment  Topical BID PRN Linna Hoff, MD        diclofenac sodium (VOLTAREN) 1 % gel 2 g  2 g Topical QID Linna Hoff, MD        diphenhydrAMINE (BENADRYL) capsule 50 mg  50 mg Oral Q6H PRN Hollie Beach P, DO        famotidine (PEPCID) tablet 20 mg  20 mg Oral Nightly Alan Ripper, DO   20 mg at 03/10/22 2111    gabapentin (NEURONTIN) capsule 200 mg  200 mg Oral Nightly Linna Hoff, MD        HYDROmorphone (PF) (DILAUDID) injection 1 mg  1 mg Intravenous Q3H PRN Durene Romans, MD   1 mg at 03/11/22 0802    hyoscyamine (LEVSIN) tablet 125 mcg  125 mcg Oral Q4H PRN Hollie Beach P, DO        lidocaine 4 % patch 2 patch  2 patch Transdermal Daily Linna Hoff, MD 2 patch at 03/11/22 0802    melatonin tablet 3 mg  3 mg Oral Nightly PRN Linna Hoff, MD        mirtazapine (REMERON) tablet 7.5 mg  7.5 mg Oral Nightly Alan Ripper, DO   7.5 mg at 03/10/22 2111    naloxone [facility]) injection 0.4 mg  0.4 mg Intravenous Once PRN Linna Hoff, MD        OLANZapine zydis (ZyPREXA) disintegrating tablet 7.5 mg  7.5 mg Oral Nightly Alan Ripper, DO   7.5 mg at 03/10/22 2112    ondansetron (ZOFRAN-ODT) disintegrating tablet 8 mg  8 mg Oral Q12H PRN Alan Ripper, DO        oxyCODONE (ROXICODONE) immediate release tablet 10 mg  10 mg Oral Q4H PRN Linna Hoff, MD             Past Medical History    Past Medical History:   Diagnosis Date    Abdominal pain     Acid reflux     occas    Anesthesia complication     itching, shaking, coldness; last few surgeries have gone much better    Cancer (CMS-HCC)     Cataract of right eye     COVID-19 virus infection 01/2019    Cyst of thyroid determined by ultrasound     monitoring    [name]     2 right forearm, 1 left thigh, 1 right scapula, 1 under left clavicle; multiple    Difficult intravenous access     [name] (familial adenomatous polyposis)     [name]     Gastric polyps     History of chemotherapy     last treatment approx 05/2019    History of colon polyps     History of COVID-19 01/2019    Iron deficiency anemia due to chronic blood loss     received iron infusion 11-2019    PONV (postoperative nausea and vomiting)     Rectal bleeding     Syncopal episodes     especially if becoming dehydrated           Past Surgical History    Past Surgical History:   Procedure Laterality Date    cyroablation      cystis removal      desmoid removal      PR CLOSE ENTEROSTOMY,RESEC+ANAST N/A 10/09/2020    Procedure: ILEOSTOMY TAKEDOWN;  Surgeon: Mickle Asper, MD;  Location: OR [place];  Service:  General Surgery    PR COLONOSCOPY W/BIOPSY SINGLE/MULTIPLE N/A 10/27/2012    Procedure: COLONOSCOPY, FLEXIBLE, PROXIMAL TO SPLENIC FLEXURE; WITH BIOPSY, SINGLE OR MULTIPLE;  Surgeon: Shirlyn Goltz Mir, MD;  Location: PEDS PROCEDURE ROOM [facility];  Service: Gastroenterology    PR COLONOSCOPY W/BIOPSY SINGLE/MULTIPLE N/A 09/14/2013    Procedure: COLONOSCOPY, FLEXIBLE, PROXIMAL TO SPLENIC FLEXURE; WITH BIOPSY, SINGLE OR MULTIPLE;  Surgeon: Shirlyn Goltz Mir, MD;  Location: PEDS PROCEDURE ROOM [place];  Service: Gastroenterology    PR COLONOSCOPY W/BIOPSY SINGLE/MULTIPLE N/A 11/08/2014    Procedure: COLONOSCOPY, FLEXIBLE, PROXIMAL TO SPLENIC FLEXURE; WITH BIOPSY, SINGLE OR MULTIPLE;  Surgeon: Arnold Long Mir, MD;  Location: PEDS PROCEDURE ROOM [facility];  Service: Gastroenterology    PR COLONOSCOPY W/BIOPSY SINGLE/MULTIPLE N/A 12/26/2015    Procedure: COLONOSCOPY, FLEXIBLE, PROXIMAL TO SPLENIC FLEXURE; WITH BIOPSY, SINGLE OR MULTIPLE;  Surgeon: Arnold Long Mir, MD;  Location: PEDS PROCEDURE ROOM [facility];  Service: Gastroenterology    PR COLONOSCOPY W/BIOPSY SINGLE/MULTIPLE N/A 09/02/2017    Procedure: COLONOSCOPY, FLEXIBLE, PROXIMAL TO SPLENIC FLEXURE; WITH BIOPSY, SINGLE OR MULTIPLE;  Surgeon: Arnold Long Mir, MD;  Location: PEDS PROCEDURE ROOM [place];  Service: Gastroenterology    PR COLSC FLX W/REMOVAL LESION BY HOT BX FORCEPS N/A 08/27/2016    Procedure: COLONOSCOPY, FLEXIBLE, PROXIMAL TO SPLENIC FLEXURE; W/REMOVAL TUMOR/POLYP/OTHER LESION, HOT BX FORCEP/CAUTE;  Surgeon: Arnold Long Mir, MD;  Location: PEDS PROCEDURE ROOM [facility];  Service: Gastroenterology    PR COLSC FLX W/RMVL OF TUMOR POLYP LESION SNARE TQ N/A 02/25/2019    Procedure: COLONOSCOPY FLEX; W/REMOV TUMOR/LES BY SNARE;  Surgeon: Helyn Numbers, MD;  Location: [facility] PROCEDURES [place] [facility];  Service: Gastroenterology    PR COLSC FLX W/RMVL OF TUMOR POLYP LESION SNARE TQ N/A 03/13/2020    Procedure: COLONOSCOPY FLEX; W/REMOV TUMOR/LES BY SNARE;  Surgeon: Helyn Numbers, MD;  Location: [facility] PROCEDURES [place] [facility];  Service: Gastroenterology    PR EXC SKIN BENIG 2.1-3 CM TRUNK,ARM,LEG Right 02/25/2017    Procedure: EXCISION, BENIGN LESION INCLUDE MARGINS, EXCEPT SKIN TAG, LEGS; EXCISED DIAMETER 2.1 TO 3.0 CM;  Surgeon: Clarene Duke, MD;  Location: CHILDRENS OR [facility];  Service: Plastics    PR EXC SKIN BENIG 3.1-4 CM TRUNK,ARM,LEG Right 02/25/2017    Procedure: EXCISION, BENIGN LESION INCLUDE MARGINS, EXCEPT SKIN TAG, ARMS; EXCISED DIAMETER 3.1 TO 4.0 CM;  Surgeon: Clarene Duke, MD;  Location: CHILDRENS OR [facility]  Service: Plastics    PR EXC SKIN BENIG >4 CM FACE,FACIAL Right 02/25/2017    Procedure: EXCISION, OTHER BENIGN LES INCLUD MARGIN, FACE/EARS/EYELIDS/NOSE/LIPS/MUCOUS MEMBRANE; EXCISED DIAM >4.0 CM;  Surgeon: Clarene Duke, MD;  Location: CHILDRENS OR [facility];  Service: Plastics    PR EXC TUMOR SOFT TISSUE LEG/ANKLE SUBQ 3+CM Right 08/05/2019    Procedure: EXCISION, TUMOR, SOFT TISSUE OF LEG OR ANKLE AREA, SUBCUTANEOUS; 3 CM OR GREATER;  Surgeon: Arsenio Katz, MD;  Location: MAIN OR [place];  Service: Plastics    PR EXC TUMOR SOFT TISSUE LEG/ANKLE SUBQ <3CM Right 08/05/2019    Procedure: EXCISION, TUMOR, SOFT TISSUE OF LEG OR ANKLE AREA, SUBCUTANEOUS; LESS THAN 3 CM;  Surgeon: Arsenio Katz, MD;  Location: MAIN OR [facility];  Service: Plastics    PR LAP, SURG PROCTECTOMY W J-POUCH N/A 08/10/2020    Procedure: ROBOTIC ASSISTED LAPAROSCOPIC PROCTOCOLECTOMY, ILEAL J POUCH, WITH OSTOMY;  Surgeon: Mickle Asper, MD;  Location: OR [place];  Service: General Surgery    PR NDSC EVAL INTSTINAL POUCH DX W/COLLJ SPEC SPX N/A 01/23/2021    Procedure:  ENDO EVAL SM INTEST POUCH; DX;  Surgeon: Modena Nunnery, MD;  Location: [facility] PROCEDURES [place] [facility];  Service: Gastroenterology    PR NDSC EVAL INTSTINAL POUCH DX W/COLLJ SPEC SPX N/A 08/27/2021    Procedure: ENDO EVAL SM INTEST POUCH; DX;  Surgeon: Hunt Oris, MD;  Location: [facility]URES [facility];  Service: Gastroenterology    PR NDSC EVAL INTSTINAL POUCH DX W/COLLJ SPEC SPX N/A 12/09/2021    Procedure: ENDO EVAL SM INTEST POUCH; DX;  Surgeon: Vidal Schwalbe, MD;  Location: [facility] PROC[facility]URES [facility];  Service: Gastroenterology    PR NDSC EVAL INTSTINAL POUCH W/BX SINGLE/MULTIPLE N/A 01/20/2022    Procedure: ENDOSCOPIC EVAL OF SMALL INTESTINAL POUCH; DIAGNOSTIC, No biopsies;  Surgeon: Andrey Farmer, MD;  Location: GI PROCEDURES [facility];  Service: Gastroenterology    PR NDSC EVAL INTSTINAL POUCH W/BX SINGLE/MULTIPLE N/A 02/13/2022    Procedure: ENDOSCOPIC EVAL OF SMALL INTESTINAL POUCH; DIAGNOSTIC, WITH BIOPSY;  Surgeon: Bronson Curb, MD;  Location: GI PROCEDURES [facility];  Service: Gastroenterology    PR UNLISTED PROCEDURE SMALL INTESTINE  01/23/2021    Procedure: UNLISTED PROCEDURE, SMALL INTESTINE;  Surgeon: Modena Nunnery, MD;  Location: [facility] PROCEDURES [place] [facility];  Service: Gastroenterology    PR UNLISTED PROCEDURE SMALL INTESTINE  02/13/2022    Procedure: UNLISTED PROCEDURE, SMALL INTESTINE;  Surgeon: Bronson Curb, MD;  Location: GI PROCEDURES [facility];  Service: Gastroenterology    PR UPPER GI ENDOSCOPY,BIOPSY N/A 10/27/2012    Procedure: UGI ENDOSCOPY; WITH BIOPSY, SINGLE OR MULTIPLE;  Surgeon: Shirlyn Goltz Mir, MD;  Location: PEDS PROCEDURE ROOM [facility];  Service: Gastroenterology    PR UPPER GI ENDOSCOPY,BIOPSY N/A 09/14/2013    Procedure: UGI ENDOSCOPY; WITH BIOPSY, SINGLE OR MULTIPLE;  Surgeon: Shirlyn Goltz Mir, MD;  Location: PEDS PROCEDURE ROOM [facility];  Service: Gastroenterology    PR UPPER GI ENDOSCOPY,BIOPSY N/A 11/08/2014    Procedure: UGI ENDOSCOPY; WITH BIOPSY, SINGLE OR MULTIPLE;  Surgeon: Arnold Long Mir, MD;  Location: PEDS PROCEDURE ROOM [facility];  Service: Gastroenterology    PR UPPER GI ENDOSCOPY,BIOPSY N/A 12/26/2015    Procedure: UGI ENDOSCOPY; WITH BIOPSY, SINGLE OR MULTIPLE;  Surgeon: Arnold Long Mir, MD;  Location: PEDS PROCEDURE ROOM [facility];  Service: Gastroenterology    PR UPPER GI ENDOSCOPY,BIOPSY N/A 08/27/2016    Procedure: UGI ENDOSCOPY; WITH BIOPSY, SINGLE OR MULTIPLE;  Surgeon: Arnold Long Mir, MD;  Location: PEDS PROCEDURE ROOM [facility];  Service: Gastroenterology    PR UPPER GI ENDOSCOPY,BIOPSY N/A 09/02/2017    Procedure: UGI ENDOSCOPY; WITH BIOPSY, SINGLE OR MULTIPLE;  Surgeon: Arnold Long Mir, MD;  Location: PEDS PROCEDURE ROOM [facility];  Service: Gastroenterology    PR UPPER GI ENDOSCOPY,BIOPSY N/A 03/13/2020    Procedure: UGI ENDOSCOPY; WITH BIOPSY, SINGLE OR MULTIPLE;  Surgeon: Helyn Numbers, MD;  Location: GI PROCEDURES [place] [facility];  Service: Gastroenterology    PR UPPER GI ENDOSCOPY,BIOPSY N/A 09/05/2021    Procedure: UGI ENDOSCOPY; WITH BIOPSY, SINGLE OR MULTIPLE;  Surgeon: Wendall Papa, MD;  Location: GI PROCEDURES [facility];  Service: Gastroenterology    PR UPPER GI ENDOSCOPY,DIAGNOSIS N/A 01/20/2022    Procedure: UGI ENDO, INCLUDE ESOPHAGUS, STOMACH, & DUODENUM &/OR JEJUNUM; DX W/WO COLLECTION SPECIMN, BY BRUSH OR WASH;  Surgeon: Andrey Farmer, MD;  Location: GI PROCEDURES [facility];  Service: Gastroenterology    TUMOR REMOVAL      multiple-head, neck, back, hand, right flank, multiple           Family History  Family History   Problem Relation Age of Onset    No Known Problems Mother     No Known Problems Father     No Known Problems Sister     No Known Problems Brother     Stroke Maternal Grandmother     Other Maternal Grandmother         benign lesions of liver and pancreas, further details unknown    Cancer Maternal Grandmother     Diabetes Maternal Grandmother     Hypertension Maternal Grandmother     Thyroid disease Maternal Grandmother     Arthritis Maternal Grandfather     Asthma Maternal Grandfather     COPD Paternal Grandmother         Deceased    Miscarriages / Stillbirths Paternal Grandmother     Alcohol abuse Paternal Grandfather         Deceased    No Known Problems Maternal Aunt     No Known Problems Maternal Uncle     No Known Problems Paternal Aunt     No Known Problems Paternal Uncle     Anesthesia problems Neg Hx     Broken bones Neg Hx     Cancer Neg Hx     Clotting disorder Neg Hx     Collagen disease Neg Hx     Diabetes Neg Hx     Dislocations Neg Hx     Fibromyalgia Neg Hx     Gout Neg Hx     Hemophilia Neg Hx     Osteoporosis Neg Hx     Rheumatologic disease Neg Hx     Scoliosis Neg Hx     Severe sprains Neg Hx     Sickle cell anemia Neg Hx     Spinal Compression Fracture Neg Hx     Melanoma Neg Hx     Basal cell carcinoma Neg Hx     Squamous cell carcinoma Neg Hx          Social History:  Social History     Tobacco Use   Smoking Status Never    Passive exposure: Past   Smokeless Tobacco Never     Social History     Substance and Sexual Activity   Alcohol Use Never     Social History     Substance and Sexual Activity   Drug Use Never         Review of Systems    A 12 system review of systems was negative except as noted in HPI    Pertinent Imaging/Labs:  reviewed    Lab Results   Component Value Date    CREATININE 0.55 03/11/2022       Lab Results   Component Value Date    ALKPHOS 48 03/09/2022    BILITOT 0.3 03/09/2022    BILIDIR 0.20 12/04/2021    PROT 6.9 03/09/2022    ALBUMIN 3.8 03/09/2022    ALT 12 03/09/2022    AST 20 03/09/2022       Encounter Date: 03/10/22   ECG 12 Lead   Result Value    EKG Systolic BP     EKG Diastolic BP     EKG Ventricular Rate 79    EKG Atrial Rate 79    EKG P-R Interval 134    EKG QRS Duration 82    EKG Q-T Interval 376    EKG QTC Calculation 431    EKG Calculated P Axis 43  EKG Calculated R Axis 33    EKG Calculated T Axis 34    QTC Fredericia 412    Narrative    NORMAL SINUS RHYTHM  NORMAL ECG  WHEN COMPARED WITH ECG OF 13-Jan-2022 08:36,  NO SIGNIFICANT CHANGE WAS FOUND  Confirmed by Joneen Roach ([id number]) on 03/10/2022 7:49:05 PM       No results found for requested labs within last 2 days.       Objective:     Vital Signs  Temp:  [36.5 ??C (97.7 ??F)-36.6 ??C (97.9 ??F)] 36.6 ??C (97.9 ??F)  Heart Rate:  [71-91] 73  SpO2 Pulse:  [71-80] 77  Resp:  [13-19] 19  BP: (103-135)/(48-64) 103/59  MAP (mmHg):  [67-79] 69  SpO2:  [95 %-99 %] 98 %  BMI (Calculated):  [21.57] 21.57    Physical Exam    GENERAL:  Well developed, well-nourished female and is in no apparent distress.   HEAD/NECK:    Reveals normocephalic/atraumatic.   CARDIOVASCULAR:   Warm, well perfused  LUNGS:   Normal work of breathing, no supplemental 02  EXTREMITIES:  Warm, no clubbing, cyanosis, or edema was noted.  NEUROLOGIC:    The patient was alert and oriented times four with normal language, attention, cognition and memory. Cranial nerve exam was grossly normal.    MUSCULOSKELETAL:    Patient moving all extremities in bed.   ABDOMEN:  Tender, distended.   SKIN:  No obvious rashes lesions or erythema  PSY:  Appropriate affect and mood.      Problem List    Principal Problem:    GI bleeding  Active Problems:    [name]    [name]    Pain    Iron deficiency anemia due to chronic blood loss    Severe protein-calorie malnutrition (CMS-HCC)    Lower abdominal pain    Generalized anxiety disorder with panic attacks

## 2022-03-11 NOTE — Unmapped (Signed)
Received sign out from previous provider.    Patient Summary:   Illness Severity: stable  [name] [name] is a 22 y.o. female past medical history of a PE, [name], gastric polyps, desmoid tumor, prior DVT's on eliquis, presenting to the emergency department with concerns of abdominal bleeding that began on Wednesday, saying that her toilet was filled with blood, discussed her oncologist and he recommended that she come in for evaluation in the emergency department today.  CT abdomen pelvis in the emergency department showed no acute findings with similar gaseous distention of small bowel loops adjacent to ill-defined infiltrative mesenteric lesion in the inferior abdomen, without evidence of bowel obstruction, at time of signout, she is pending admission for worsening abdominal pain and concern for GI bleed with prior history of the same. Workup notable for CT A/P with gaseous distention of small bowel loops adjacent to the ill-defined infiltrative mesenteric lesion in the inferior abdomen, CBC w/mildly interval decreased hgb 10.1 -> 9.2, CMP reassuring, EKG reassuring. Will provide pain management as clinically indicated. Discussed case w/hospital medicine, low suspicion for active GIB but will plan for admission for pain control.   Action List:   Team assignment    Updates       Final diagnoses:   Melena (Primary)   [name] (familial adenomatous polyposis)   Immunosuppression (CMS-HCC)       Vitals:    03/10/22 1100 03/10/22 1500 03/10/22 1917 03/10/22 1918   BP: 107/54 112/61 135/48    Pulse: 73 82 91    Resp: 16 13 16     Temp:       TempSrc:       SpO2: 96% 98%  97%   Weight:           CT Abdomen Pelvis W IV Contrast Only   Final Result   Stable findings compared to 02/16/2022:   -Similar gaseous distention of small bowel loops adjacent to the ill-defined infiltrative mesenteric lesion in the inferior abdomen. No high-grade bowel obstruction.   -Similar appearance of multiple mesenteric lesions. @  Lab Results   Component Value Date    WBC 7.0 03/09/2022    HGB 9.2 (L) 03/10/2022    HCT 27.5 (L) 03/10/2022    PLT 261 03/09/2022        Lab Results   Component Value Date    NA 140 03/09/2022    K 3.6 03/09/2022    CL 108 (H) 03/09/2022    CO2 24.0 03/09/2022    BUN 10 03/09/2022    CREATININE 0.48 (L) 03/09/2022    GLU 103 03/09/2022    CALCIUM 9.3 03/09/2022    MG 2.1 02/28/2022    PHOS 4.4 02/28/2022       Lab Results   Component Value Date    BILITOT 0.3 03/09/2022    BILIDIR 0.20 12/04/2021    PROT 6.9 03/09/2022    ALBUMIN 3.8 03/09/2022    ALT 12 03/09/2022    AST 20 03/09/2022    ALKPHOS 48 03/09/2022    GGT 13 10/31/2011       Lab Results   Component Value Date    INR 1.04 03/09/2022    APTT 39.0 (H) 03/09/2022       Leighton Parody, M.D.  Department of Emergency Medicine, PGY-1

## 2022-03-11 NOTE — Unmapped (Cosign Needed)
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 [name]    Assessment: [name] is a 22 y.o. 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 Of note, she was admitted in Nov with concerns for GIB, AUB, as well as mucositis/stomatitis in setting of initiation of sorafenib (and initiation of anticoagulation for line associated DVT, now discontinued). Pouchoscopy 01/20/22 without active bleeding but an anal fissure and a nonbleeding anastomotic ulcer. She was admitted again in Dec for concerns for GIB with pouchoscopy with GI on 12/7 which showed bleeding ulcer at ileoanal anastomosis, treated with APC. Patient recently seen (12/22) by Dr. [name] with plan to continue sorafenib given clear response to sorafenib. She presents again for concern for GIB.    Recommendations:   -Stop sorafenib. Likely will not resume. Dr. [name] discussed risks and benefits of nirogacestat as another treatment option and will arrange for patient to meet with [place] to discuss risks related to fertility.  -Agree with GI consult  -Recommend AYA consult; see would also like to see a therapy dog if possible    This patient has been staffed with her outpatient primary oncologist Dr. [name]. These recommendations were discussed with the primary team in person.     Please contact the oncology consult fellow at [phone] with any further questions.    Philippa Sicks, MD PhD  Oncology Fellow    -------------------------------------------------------------    HPI: [name] is a 22 y.o. female and who is being seen at the request of [name], MD for evaluation of desmoid tumor. 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 Of note, she was admitted in Nov with concerns for GIB, AUB, as well as mucositis/stomatitis in setting of initiation of sorafenib (and initiation of anticoagulation for line associated DVT, now discontinued). Pouchoscopy 01/20/22 without active bleeding but an anal fissure and a nonbleeding anastomotic ulcer. She was admitted again in Dec with pouchoscopy with GI on 12/7 which showed bleeding ulcer at ileoanal anastamosis, treated with APC. Patient recently seen (12/22) by Dr. [name] with plan to continue sorafenib given clear response to sorafenib on recent imaging.     She and her mother presented to the ED on 12/31 with concern for recurrent GIB and abdominal distension. Labs reassuring with Hgb 8.8 today. WBC 5.9, PLT 253. CT abdomen and pelvis with IV contrast only with stable gaseous distention of small bowel loops adjacent to the ill-defined infiltrative mesenteric lesion in the inferior abdomen. No high-grade bowel obstruction. Stable appearance of her mesenteric lesions.      From Dr. Brynda Rim note on 02/28/22:    DIAGNOSIS:  1. [name]    2. Severe protein-calorie malnutrition (CMS-HCC)         ASSESSMENT/PLAN: 22 y.o. 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     Desmoid fibromatosis, history of [name]     She has had numerous  sites of disease, including paraspinal, chest wall, right arm/wrist. She has had several cryoablations with Dr. Cathie Hoops which have seemed to offer clinical benefit. She trialed sorafenib which seemed to lead to shrinkage of lesions, but she had significant intolerance with dizziness, presyncope, fatigue, hair loss, rash, GI side effects.      We have discussed at length the nature of desmoid fibromatosis tumors, the fact they can wax and wane independent of therapy, are not cancers in a metastatic potential, but can be locally infiltrative and cause substantial morbidity to the site of disease and occasionally mortality. I reviewed that the emerging standard of care is observation based on the fact that 20-30% will experience spontaneous regression in the year following diagnosis. Given her long experience with desmoids, complete regression may be less likely, but we will still approach this cautiously with parallel goals of avoidance of overtreatment (particularly surgical) and palliation of symptoms / preservation of function.     At first visit in 2022, her lesions are in the shoulder, right forearm, chest area, with a possible desmoid in her left thigh. Symptoms are manageable at the moment, after recent cryoablations and steroid injection to the forearm.     06/21/21: Has had a complex few months, on TPN, significant fluid overload, caused worsening desmoid pain. Now off TPN, nutrition slowly improving, tumor pain better. Scans show 2 ill defined soft tissue nodules, concerning for recurrent desmoid. Start with surveillance, given risks of systemic therapy after recent significant GI and medical issues.      09/03/21: Scans show stable disease. Was admitted for abdominal pain, concern for intussusception, N/V, but we discussed that these may not be related to her desmoids. Opted for continued surveillance.     12/03/21: Scans show slow growth when reflecting over the past several months. Importantly, she reports substantially worsening abdominal pain which is centered on her desmoids, poor appetite, weight loss, possible transient obstructive symptoms. Long conversation about options and pros and cons of each. She would like to trial sorafenib again, acknowledging the risks. She will meet with GI, her psychiatry team (she has had challenges with duloxetine), pain team, and may reconnect with Dr. Selena Batten in the interim to discuss other supportive care measures and procedural options.      9/28-10/13/23: Hospitalization for abdominal pain, malnutrition. Pouchoscopy 10/4 without stricture. Initiated on tube feeds. Sorafenib was initiated ~ 10/3 and tolerated reasonably well in house.     12/31/21: Tox check. Sorafenib 200mg  daily, but a variety of ongoing issues. Diarrhea (which may be drug related), ongoing abdominal pain (perhaps partially desmoid related but not exclusively), intolerance of PO diet and even tube feeds (driven by pain / cramping), rash (drug related), anxiety.      01/10/22-01/22/22: 2-week hospitalization, due to uncontrolled pain in the setting of recent celiac plexus block, complicated by GI bleeding in setting of anticoagulation due to line associated DVT. She subsequently required multiple iron infusions, and was discharged on eliquis 2.5mg  BID.  During the hospitalization was noted to be persistently tachycardic, likely multifactorial etiology.  Additionally during her hospital admission was restarted on TPN in consultation with her GI team.  Patient was discharged on 01/22/2022 on TPN.       12/6-12/13/23: Another hospitalization, for GI bleed in setting of sorafenib and anticoagulation. Pain control was difficult, underwent endoscopy to evaluate for pouchitis.     02/28/22: Continued challenges since hospital stay. Reviewed several key factors: clear growth of desmoid tumors in Sept, prior to initiation of  sorafenib. Clear response/shrinkage of desmoid tumors on Dec 10 scans, indicating treatment response to 200mg  sorafenib. Pouch biopsy revealed pouchitis, sounds like she has not been able to discuss these findings with her GI team (Dr. [name] is out of town due to a death in the family). Inpatient GI team told her she did not have any pathology on endoscopy. Depression remains a big challenge, will coordinate with psychiatry.     Resume sorafenib 200mg  daily. Reviewed risk of re-bleeding, but this is reduced in the setting of discontinuation of anticoagulation.     Anxiety  AYA Needs  Patient is 21 and in the past year has had the collision of numerous medical challenges which have pushed her to see her health and future medical care more clearly. Trying to balance all of her medical issues this with future goals of career, nursing, college. Significant burden of anxiety, maybe depression as well. Was reportedly diagnosed with medical trauma by the counseling office at her university. Also trying to establish her own autonomy after a medicalized childhood  - connected with AYA team, [name]. May need input from [facility] or others at some point  - seen by AYA palliative care sarcoma collaborative, [name], will assist with pain management and symptom control, as well as grappling with current issues relating to cancer, AYA, development     Tachycardia  Patient with persistent sinus tachycardia, usually around 100 bpm.  This is in the setting of anxiety and abdominal pain.  Etiology likely multifactorial as she is also unable to take good p.o., currently on TPN.  Discussed potential interactions of sorafenib, do not feel that this is significantly contributing to her tachycardia.  Plan to continue work with subspecialists to control her anxiety as well as taking steps with the pain management team to have her pain better managed.     Tumor pain: seen by AYA sarcoma palliative care, [name], as well as pain team - [name] has been working closely with her  -defer to pain team     Contraception: Not reviewed today, will need to confirm prior to initiation of TKI  -fertility referral pending due to her interest in discussing options with them, especially prior to possible use of nirogacestat which has a risk of amenorrhea / ovarian dysfunction     Line Associated DVT:   Duplex demonstrates DVT shortly after line placement. Treated with Apixaban 5mg  BID x 3 mos, now completed     Plan:  -TPN per GI  -continue pain management with pain service  -continue sorafenib at 200mg  daily versus consideration for nirogacestat  -coordinate with GI, psychiatry, AYA team  -RTC 2 weeks for tox check        Review of Systems: All positive and pertinent negatives are noted in the HPI; a 10 system review of systems was otherwise negative except as noted in HPI.    Oncologic History:  Oncology History    No history exists.       Past Medical History:   Diagnosis Date    Abdominal pain     Acid reflux     occas    Anesthesia complication     itching, shaking, coldness; last few surgeries have gone much better    Cancer (CMS-HCC)     Cataract of right eye     COVID-19 virus infection 01/2019    Cyst of thyroid determined by ultrasound     monitoring    [name]  2 right forearm, 1 left thigh, 1 right scapula, 1 under left clavicle; multiple    Difficult intravenous access     [name] (familial adenomatous polyposis)     [name]     Gastric polyps     History of chemotherapy     last treatment approx 05/2019    History of colon polyps     History of COVID-19 01/2019    Iron deficiency anemia due to chronic blood loss     received iron infusion 11-2019    PONV (postoperative nausea and vomiting)     Rectal bleeding     Syncopal episodes     especially if becoming dehydrated       Past Surgical History:   Procedure Laterality Date    cyroablation      cystis removal      desmoid removal      PR CLOSE ENTEROSTOMY,RESEC+ANAST N/A 10/09/2020    Procedure: ILEOSTOMY TAKEDOWN;  Surgeon: Mickle Asper, MD;  Location: OR [place];  Service: General Surgery    PR COLONOSCOPY W/BIOPSY SINGLE/MULTIPLE N/A 10/27/2012    Procedure: COLONOSCOPY, FLEXIBLE, PROXIMAL TO SPLENIC FLEXURE; WITH BIOPSY, SINGLE OR MULTIPLE;  Surgeon: Shirlyn Goltz Mir, MD;  Location: PEDS PROCEDURE ROOM [place];  Service: Gastroenterology    PR COLONOSCOPY W/BIOPSY SINGLE/MULTIPLE N/A 09/14/2013    Procedure: COLONOSCOPY, FLEXIBLE, PROXIMAL TO SPLENIC FLEXURE; WITH BIOPSY, SINGLE OR MULTIPLE;  Surgeon: Shirlyn Goltz Mir, MD;  Location: PEDS PROCEDURE ROOM [place];  Service: Gastroenterology    PR COLONOSCOPY W/BIOPSY SINGLE/MULTIPLE N/A 11/08/2014    Procedure: COLONOSCOPY, FLEXIBLE, PROXIMAL TO SPLENIC FLEXURE; WITH BIOPSY, SINGLE OR MULTIPLE;  Surgeon: Arnold Long Mir, MD;  Location: PEDS PROCEDURE ROOM [facility];  Service: Gastroenterology    PR COLONOSCOPY W/BIOPSY SINGLE/MULTIPLE N/A 12/26/2015    Procedure: COLONOSCOPY, FLEXIBLE, PROXIMAL TO SPLENIC FLEXURE; WITH BIOPSY, SINGLE OR MULTIPLE;  Surgeon: Arnold Long Mir, MD;  Location: PEDS PROCEDURE ROOM [facility];  Service: Gastroenterology    PR COLONOSCOPY W/BIOPSY SINGLE/MULTIPLE N/A 09/02/2017    Procedure: COLONOSCOPY, FLEXIBLE, PROXIMAL TO SPLENIC FLEXURE; WITH BIOPSY, SINGLE OR MULTIPLE;  Surgeon: Arnold Long Mir, MD;  Location: PEDS PROCEDURE ROOM [place];  Service: Gastroenterology    PR COLSC FLX W/REMOVAL LESION BY HOT BX FORCEPS N/A 08/27/2016    Procedure: COLONOSCOPY, FLEXIBLE, PROXIMAL TO SPLENIC FLEXURE; W/REMOVAL TUMOR/POLYP/OTHER LESION, HOT BX FORCEP/CAUTE;  Surgeon: Arnold Long Mir, MD;  Location: PEDS PROCEDURE ROOM [facility];  Service: Gastroenterology    PR COLSC FLX W/RMVL OF TUMOR POLYP LESION SNARE TQ N/A 02/25/2019    Procedure: COLONOSCOPY FLEX; W/REMOV TUMOR/LES BY SNARE;  Surgeon: Helyn Numbers, MD;  Location: [facility] PROCEDURES [place] [facility];  Service: Gastroenterology    PR COLSC FLX W/RMVL OF TUMOR POLYP LESION SNARE TQ N/A 03/13/2020    Procedure: COLONOSCOPY FLEX; W/REMOV TUMOR/LES BY SNARE;  Surgeon: Helyn Numbers, MD;  Location: [facility] PROCEDURES [place] [facility];  Service: Gastroenterology    PR EXC SKIN BENIG 2.1-3 CM TRUNK,ARM,LEG Right 02/25/2017    Procedure: EXCISION, BENIGN LESION INCLUDE MARGINS, EXCEPT SKIN TAG, LEGS; EXCISED DIAMETER 2.1 TO 3.0 CM;  Surgeon: Clarene Duke, MD;  [facility]: CHILDRENS OR [facility];  Service: Plastics    PR EXC SKIN BENIG 3.1-4 CM TRUNK,ARM,LEG Right 02/25/2017    Procedure: EXCISION, BENIGN LESION INCLUDE MARGINS, EXCEPT SKIN TAG, ARMS; EXCISED DIAMETER 3.1 TO 4.0 CM;  Surgeon: Clarene Duke, MD;  Location: CHILDRENS OR [facility];  Service: Plastics    PR EXC SKIN BENIG >4 CM FACE,FACIAL Right  02/25/2017    Procedure: EXCISION, OTHER BENIGN LES INCLUD MARGIN, FACE/EARS/EYELIDS/NOSE/LIPS/MUCOUS MEMBRANE; EXCISED DIAM >4.0 CM;  Surgeon: Clarene Duke, MD;  Location: CHILDRENS OR [facility];  Service: Plastics    PR EXC TUMOR SOFT TISSUE LEG/ANKLE SUBQ 3+CM Right 08/05/2019    Procedure: EXCISION, TUMOR, SOFT TISSUE OF LEG OR ANKLE AREA, SUBCUTANEOUS; 3 CM OR GREATER;  Surgeon: Arsenio Katz, MD;  Location: MAIN OR [place];  Service: Plastics    PR EXC TUMOR SOFT TISSUE LEG/ANKLE SUBQ <3CM Right 08/05/2019    Procedure: EXCISION, TUMOR, SOFT TISSUE OF LEG OR ANKLE AREA, SUBCUTANEOUS; LESS THAN 3 CM;  Surgeon: Arsenio Katz, MD;  Location: MAIN OR [facility];  Service: Plastics    PR LAP, SURG PROCTECTOMY W J-POUCH N/A 08/10/2020    Procedure: ROBOTIC ASSISTED LAPAROSCOPIC PROCTOCOLECTOMY, ILEAL J POUCH, WITH OSTOMY;  Surgeon: Mickle Asper, MD;  Location: OR [place];  Service: General Surgery    PR NDSC EVAL INTSTINAL POUCH DX W/COLLJ SPEC SPX N/A 01/23/2021    Procedure: ENDO EVAL SM INTEST POUCH; DX;  Surgeon: Modena Nunnery, MD;  Location: [place] [facility] [place] [facility];  Service: Gastroenterology    PR NDSC EVAL INTSTINAL POUCH DX W/COLLJ SPEC SPX N/A 08/27/2021    Procedure: ENDO EVAL SM INTEST POUCH; DX;  Surgeon: Hunt Oris, MD;  Location: GI PROC[facility]URES [facility];  Service: Gastroenterology    PR NDSC EVAL INTSTINAL POUCH DX W/COLLJ SPEC SPX N/A 12/09/2021    Procedure: ENDO EVAL SM INTEST POUCH; DX;  Surgeon: Vidal Schwalbe, MD;  Location: GI PROCEDURES [facility];  Service: Gastroenterology    PR NDSC EVAL INTSTINAL POUCH W/BX SINGLE/MULTIPLE N/A 01/20/2022    Procedure: ENDOSCOPIC EVAL OF SMALL INTESTINAL POUCH; DIAGNOSTIC, No biopsies;  Surgeon: Andrey Farmer, MD;  Location: GI PROCEDURES [facility];  Service: Gastroenterology    PR NDSC EVAL INTSTINAL POUCH W/BX SINGLE/MULTIPLE N/A 02/13/2022    Procedure: ENDOSCOPIC EVAL OF SMALL INTESTINAL POUCH; DIAGNOSTIC, WITH BIOPSY;  Surgeon: Bronson Curb, MD;  Location: GI PROCEDURES [facility];  Service: Gastroenterology    PR UNLISTED PROCEDURE SMALL INTESTINE  01/23/2021    Procedure: UNLISTED PROCEDURE, SMALL INTESTINE;  Surgeon: Modena Nunnery, MD;  Location: [facility] PROCEDURES [place] [facility];  Service: Gastroenterology    PR UNLISTED PROCEDURE SMALL INTESTINE  02/13/2022    Procedure: UNLISTED PROCEDURE, SMALL INTESTINE;  Surgeon: Bronson Curb, MD;  Location: GI PROCEDURES [facility];  Service: Gastroenterology    PR UPPER GI ENDOSCOPY,BIOPSY N/A 10/27/2012    Procedure: UGI ENDOSCOPY; WITH BIOPSY, SINGLE OR MULTIPLE;  Surgeon: Shirlyn Goltz Mir, MD;  Location: PEDS PROCEDURE ROOM [facility];  Service: Gastroenterology    PR UPPER GI ENDOSCOPY,BIOPSY N/A 09/14/2013    Procedure: UGI ENDOSCOPY; WITH BIOPSY, SINGLE OR MULTIPLE;  Surgeon: Shirlyn Goltz Mir, MD;  Location: PEDS PROCEDURE ROOM [facility];  Service: Gastroenterology    PR UPPER GI ENDOSCOPY,BIOPSY N/A 11/08/2014    Procedure: UGI ENDOSCOPY; WITH BIOPSY, SINGLE OR MULTIPLE;  Surgeon: Arnold Long Mir, MD;  Location: PEDS PROCEDURE ROOM [facility];  Service: Gastroenterology    PR UPPER GI ENDOSCOPY,BIOPSY N/A 12/26/2015    Procedure: UGI ENDOSCOPY; WITH BIOPSY, SINGLE OR MULTIPLE;  Surgeon: Arnold Long Mir, MD;  Location: PEDS PROCEDURE ROOM [facility];  Service: Gastroenterology    PR UPPER GI ENDOSCOPY,BIOPSY N/A 08/27/2016 Procedure: UGI ENDOSCOPY; WITH BIOPSY, SINGLE OR MULTIPLE;  Surgeon: Arnold Long Mir, MD;  Location: PEDS PROCEDURE ROOM [facility];  Service: Gastroenterology    PR UPPER GI  ENDOSCOPY,BIOPSY N/A 09/02/2017    Procedure: UGI ENDOSCOPY; WITH BIOPSY, SINGLE OR MULTIPLE;  Surgeon: Arnold Long Mir, MD;  Location: PEDS PROCEDURE ROOM [facility];  Service: Gastroenterology    PR UPPER GI ENDOSCOPY,BIOPSY N/A 03/13/2020    Procedure: UGI ENDOSCOPY; WITH BIOPSY, SINGLE OR MULTIPLE;  Surgeon: Helyn Numbers, MD;  Location: GI PROCEDURES [place] [facility];  Service: Gastroenterology    PR UPPER GI ENDOSCOPY,BIOPSY N/A 09/05/2021    Procedure: UGI ENDOSCOPY; WITH BIOPSY, SINGLE OR MULTIPLE;  Surgeon: Wendall Papa, MD;  Location: GI PROCEDURES [facility];  Service: Gastroenterology    PR UPPER GI ENDOSCOPY,DIAGNOSIS N/A 01/20/2022    Procedure: UGI ENDO, INCLUDE ESOPHAGUS, STOMACH, & DUODENUM &/OR JEJUNUM; DX W/WO COLLECTION SPECIMN, BY BRUSH OR WASH;  Surgeon: Andrey Farmer, MD;  Location: GI PROCEDURES [facility];  Service: Gastroenterology    TUMOR REMOVAL      multiple-head, neck, back, hand, right flank, multiple       Family History   Problem Relation Age of Onset    No Known Problems Mother     No Known Problems Father     No Known Problems Sister     No Known Problems Brother     Stroke Maternal Grandmother     Other Maternal Grandmother         benign lesions of liver and pancreas, further details unknown    Cancer Maternal Grandmother     Diabetes Maternal Grandmother     Hypertension Maternal Grandmother     Thyroid disease Maternal Grandmother     Arthritis Maternal Grandfather     Asthma Maternal Grandfather     COPD Paternal Grandmother         Deceased    Miscarriages / Stillbirths Paternal Grandmother     Alcohol abuse Paternal Grandfather         Deceased    No Known Problems Maternal Aunt     No Known Problems Maternal Uncle     No Known Problems Paternal Aunt     No Known Problems Paternal Uncle Anesthesia problems Neg Hx     Broken bones Neg Hx     Cancer Neg Hx     Clotting disorder Neg Hx     Collagen disease Neg Hx     Diabetes Neg Hx     Dislocations Neg Hx     Fibromyalgia Neg Hx     Gout Neg Hx     Hemophilia Neg Hx     Osteoporosis Neg Hx     Rheumatologic disease Neg Hx     Scoliosis Neg Hx     Severe sprains Neg Hx     Sickle cell anemia Neg Hx     Spinal Compression Fracture Neg Hx     Melanoma Neg Hx     Basal cell carcinoma Neg Hx     Squamous cell carcinoma Neg Hx         Social History     Socioeconomic History    Marital status: Single   Tobacco Use    Smoking status: Never     Passive exposure: Past    Smokeless tobacco: Never   Vaping Use    Vaping Use: Never used   Substance and Sexual Activity    Alcohol use: Never    Drug use: Never    Sexual activity: Never   Other Topics Concern    Do you use sunscreen? Yes    Tanning bed use? No  Are you easily burned? No    Excessive sun exposure? No    Blistering sunburns? 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       Allergies: is allergic to adhesive tape-silicones; ferrlecit [sodium ferric gluconat-sucrose]; levofloxacin; methylnaltrexone; neomycin; papaya; morphine; zosyn [piperacillin-tazobactam]; compazine [prochlorperazine]; and latex, natural rubber.    Medications:   Meds:   cefdinir  300 mg Oral Q24H SCH    diclofenac sodium  2 g Topical QID    famotidine  20 mg Oral Nightly    gabapentin  200 mg Oral Nightly    lidocaine  2 patch Transdermal Daily    mirtazapine  7.5 mg Oral Nightly    OLANZapine zydis  7.5 mg Oral Nightly     Continuous Infusions:  PRN Meds:.acetaminophen, baclofen, clobetasol, diphenhydrAMINE, HYDROmorphone, hyoscyamine, melatonin, naloxone, ondansetron, oxyCODONE    Objective:   Vitals: Temp:  [36.5 ??C (97.7 ??F)-36.6 ??C (97.9 ??F)] 36.6 ??C (97.9 ??F)  Heart Rate:  [71-91] 73  SpO2 Pulse:  [71-80] 77  Resp:  [13-19] 19  BP: (103-135)/(48-64) 103/59  MAP (mmHg):  [67-79] 69  SpO2:  [95 %-99 %] 98 %  BMI (Calculated):  [21.57] 21.57  I/O this shift:  In: 240 [P.O.:240]  Out: -     Physical Exam:  BP 103/59  - Pulse 73  - Temp 36.6 ??C (97.9 ??F) (Oral)  - Resp 19  - Ht 165.1 cm (5' 5)  - Wt 58.8 kg (129 lb 9.6 oz)  - LMP 02/13/2022  - SpO2 98%  - BMI 21.57 kg/m??    General appearance - tired-appearing, no distress   Mental status - alert, oriented to person, place, and time   Eyes - pupils equal and reactive, extraocular eye movements intact   Nose - normal and patent, no erythema, discharge or polyps   Mouth -  no exudate or lesions    Neck - no JVD  Pulmonary - breathing non-labored on room air  Gastrointestinal - No TTP but distended, RLQ most distended  Neurological - alert, oriented, normal speech, no focal findings or movement disorder noted   Musculoskeletal - no joint tenderness, deformity or swelling   Extremities - peripheral pulses normal, no pedal edema, no clubbing or cyanosis   Skin - normal coloration and turgor, no rashes, no suspicious skin lesions noted     ECOG Performance Status: 3 - Capable of only limited selfcare, confined to bed or chair more than 50% of waking hours  Test Results  Lab Results   Component Value Date WBC 5.9 03/11/2022    HGB 8.8 (L) 03/11/2022    HCT 26.7 (L) 03/11/2022    PLT 253 03/11/2022     Lab Results   Component Value Date    NA 142 03/11/2022    K 3.6 03/11/2022    CL 110 (H) 03/11/2022    CO2 27.0 03/11/2022    BUN 10 03/11/2022    CREATININE 0.55 03/11/2022    GLU 94 03/11/2022    CALCIUM 8.9 03/11/2022    MG 2.4 03/11/2022    PHOS 4.4 02/28/2022     Lab Results   Component Value Date    BILITOT 0.3 03/09/2022    BILIDIR 0.20 12/04/2021    PROT 6.9 03/09/2022    ALBUMIN 3.8 03/09/2022    ALT 12 03/09/2022    AST 20 03/09/2022    ALKPHOS 48 03/09/2022    GGT 13 10/31/2011     Lab Results   Component Value Date    INR 1.04 03/09/2022    APTT 39.0 (H) 03/09/2022       Imaging: Radiology studies were personally reviewed

## 2022-03-11 NOTE — Unmapped (Signed)
Bed: 15-B  Expected date:   Expected time:   Means of arrival:   Comments:  Team P

## 2022-03-11 NOTE — Unmapped (Signed)
Problem: Adult Inpatient Plan of Care  Goal: Plan of Care Review  Outcome: Ongoing - Unchanged  Goal: Patient-Specific Goal (Individualized)  Outcome: Ongoing - Unchanged  Goal: Absence of Hospital-Acquired Illness or Injury  Outcome: Ongoing - Unchanged  Goal: Optimal Comfort and Wellbeing  Outcome: Ongoing - Unchanged  Goal: Readiness for Transition of Care  Outcome: Ongoing - Unchanged  Intervention: Mutually Develop Transition Plan  Recent Flowsheet Documentation  Taken 03/11/2022 0255 by Misty Stanley, RN  Transportation Anticipated: family or friend will provide  Goal: Rounds/Family Conference  Outcome: Ongoing - Unchanged     Problem: Gastrointestinal Bleeding  Goal: Optimal Coping with Acute Illness  Outcome: Ongoing - Unchanged  Goal: Hemostasis  Outcome: Ongoing - Unchanged     Problem: Pain Acute  Goal: Optimal Pain Control and Function  Outcome: Ongoing - Unchanged     Problem: Latex Allergy  Goal: Absence of Allergy Symptoms  Outcome: Ongoing - Unchanged

## 2022-03-12 DIAGNOSIS — K921 Melena: Secondary | ICD-10-CM | POA: Diagnosis not present

## 2022-03-12 DIAGNOSIS — Z932 Ileostomy status: Secondary | ICD-10-CM | POA: Diagnosis not present

## 2022-03-12 DIAGNOSIS — R112 Nausea with vomiting, unspecified: Secondary | ICD-10-CM | POA: Diagnosis not present

## 2022-03-12 DIAGNOSIS — R1084 Generalized abdominal pain: Secondary | ICD-10-CM | POA: Diagnosis not present

## 2022-03-12 DIAGNOSIS — E860 Dehydration: Secondary | ICD-10-CM | POA: Diagnosis not present

## 2022-03-12 LAB — HEPATIC FUNCTION PANEL
ALBUMIN: 3.1 g/dL — ABNORMAL LOW (ref 3.4–5.0)
ALKALINE PHOSPHATASE: 44 U/L — ABNORMAL LOW (ref 46–116)
ALT (SGPT): 8 U/L — ABNORMAL LOW (ref 10–49)
AST (SGOT): 14 U/L (ref <=34)
BILIRUBIN DIRECT: <0.1 mg/dL (ref 0.00–0.30)
BILIRUBIN TOTAL: <0.2 mg/dL — ABNORMAL LOW (ref 0.3–1.2)
PROTEIN TOTAL: 6.1 g/dL (ref 5.7–8.2)

## 2022-03-12 LAB — CBC
HEMATOCRIT: 27.4 % — ABNORMAL LOW (ref 34.0–44.0)
HEMATOCRIT: 27.3 % — ABNORMAL LOW (ref 34.0–44.0)
HEMOGLOBIN: 9.1 g/dL — ABNORMAL LOW (ref 11.3–14.9)
HEMOGLOBIN: 9.1 g/dL — ABNORMAL LOW (ref 11.3–14.9)
MEAN CORPUSCULAR HEMOGLOBIN CONC: 33 g/dL (ref 32.0–36.0)
MEAN CORPUSCULAR HEMOGLOBIN CONC: 33.2 g/dL (ref 32.0–36.0)
MEAN CORPUSCULAR HEMOGLOBIN: 27.5 pg (ref 25.9–32.4)
MEAN CORPUSCULAR HEMOGLOBIN: 27.3 pg (ref 25.9–32.4)
MEAN CORPUSCULAR VOLUME: 83.2 fL (ref 77.6–95.7)
MEAN CORPUSCULAR VOLUME: 82.1 fL (ref 77.6–95.7)
MEAN PLATELET VOLUME: 9.7 fL (ref 6.8–10.7)
MEAN PLATELET VOLUME: 9.4 fL (ref 6.8–10.7)
PLATELET COUNT: 272 10*9/L (ref 150–450)
PLATELET COUNT: 254 10*9/L (ref 150–450)
RED BLOOD CELL COUNT: 3.3 10*12/L — ABNORMAL LOW (ref 3.95–5.13)
RED BLOOD CELL COUNT: 3.33 10*12/L — ABNORMAL LOW (ref 3.95–5.13)
RED CELL DISTRIBUTION WIDTH: 14.8 % (ref 12.2–15.2)
RED CELL DISTRIBUTION WIDTH: 14.7 % (ref 12.2–15.2)
WBC ADJUSTED: 5.9 10*9/L (ref 3.6–11.2)
WBC ADJUSTED: 7.7 10*9/L (ref 3.6–11.2)

## 2022-03-12 LAB — BASIC METABOLIC PANEL
ANION GAP: 5 mmol/L (ref 5–14)
BLOOD UREA NITROGEN: 10 mg/dL (ref 9–23)
BUN / CREAT RATIO: 20
CALCIUM: 8.7 mg/dL (ref 8.7–10.4)
CHLORIDE: 109 mmol/L — ABNORMAL HIGH (ref 98–107)
CO2: 28 mmol/L (ref 20.0–31.0)
CREATININE: 0.51 mg/dL — ABNORMAL LOW
EGFR CKD-EPI (2021) FEMALE: >90 mL/min/{1.73_m2} (ref >=60)
GLUCOSE RANDOM: 121 mg/dL (ref 70–179)
POTASSIUM: 3.7 mmol/L (ref 3.4–4.8)
SODIUM: 142 mmol/L (ref 135–145)

## 2022-03-12 LAB — TRIGLYCERIDES: TRIGLYCERIDES: 117 mg/dL (ref 0–150)

## 2022-03-12 LAB — PHOSPHORUS: PHOSPHORUS: 3.6 mg/dL (ref 2.4–5.1)

## 2022-03-12 LAB — MAGNESIUM: MAGNESIUM: 2.2 mg/dL (ref 1.6–2.6)

## 2022-03-12 MED ADMIN — mirtazapine (REMERON) tablet 7.5 mg: 7.5 mg | ORAL | @ 02:00:00

## 2022-03-12 MED ADMIN — HYDROmorphone (PF) (DILAUDID) injection 1 mg: 1 mg | INTRAVENOUS | @ 15:00:00 | Stop: 2022-03-12

## 2022-03-12 MED ADMIN — lidocaine 4 % patch 2 patch: 2 | TRANSDERMAL | @ 15:00:00

## 2022-03-12 MED ADMIN — HYDROmorphone (PF) injection Syrg 0.5 mg: .5 mg | INTRAVENOUS | @ 19:00:00 | Stop: 2022-03-13

## 2022-03-12 MED ADMIN — HYDROmorphone (PF) (DILAUDID) injection 1 mg: 1 mg | INTRAVENOUS | @ 05:00:00 | Stop: 2022-03-12

## 2022-03-12 MED ADMIN — oxyCODONE (ROXICODONE) immediate release tablet 10 mg: 10 mg | ORAL | @ 21:00:00 | Stop: 2022-03-25

## 2022-03-12 MED ADMIN — acetaminophen (TYLENOL) tablet 1,000 mg: 1000 mg | ORAL | @ 17:00:00

## 2022-03-12 MED ADMIN — OLANZapine zydis (ZyPREXA) disintegrating tablet 7.5 mg: 7.5 mg | ORAL | @ 02:00:00 | Stop: 2022-03-17

## 2022-03-12 MED ADMIN — HYDROmorphone (PF) injection Syrg 0.5 mg: .5 mg | INTRAVENOUS | @ 23:00:00 | Stop: 2022-03-13

## 2022-03-12 MED ADMIN — ciprofloxacin HCl (CIPRO) tablet 500 mg: 500 mg | ORAL | @ 15:00:00 | Stop: 2022-04-08

## 2022-03-12 MED ADMIN — ondansetron (ZOFRAN-ODT) disintegrating tablet 8 mg: 8 mg | ORAL | @ 15:00:00

## 2022-03-12 MED ADMIN — Parenteral Nutrition (CENTRAL): INTRAVENOUS | @ 06:00:00 | Stop: 2022-03-13

## 2022-03-12 MED ADMIN — famotidine (PEPCID) tablet 20 mg: 20 mg | ORAL | @ 02:00:00 | Stop: 2022-03-17

## 2022-03-12 MED ADMIN — gabapentin (NEURONTIN) capsule 200 mg: 200 mg | ORAL | @ 02:00:00

## 2022-03-12 MED ADMIN — HYDROmorphone (PF) (DILAUDID) injection 1 mg: 1 mg | INTRAVENOUS | @ 09:00:00 | Stop: 2022-03-12

## 2022-03-12 MED ADMIN — ciprofloxacin HCl (CIPRO) tablet 500 mg: 500 mg | ORAL | @ 02:00:00 | Stop: 2022-04-08

## 2022-03-12 MED ADMIN — HYDROmorphone (PF) (DILAUDID) injection 1 mg: 1 mg | INTRAVENOUS | @ 02:00:00 | Stop: 2022-03-13

## 2022-03-12 MED ADMIN — fat emulsion 20 % with fish oil (SMOFlipid) infusion 250 mL: 250 mL | INTRAVENOUS | @ 06:00:00 | Stop: 2022-03-12

## 2022-03-12 MED ADMIN — oxyCODONE (ROXICODONE) immediate release tablet 10 mg: 10 mg | ORAL | @ 17:00:00 | Stop: 2022-03-25

## 2022-03-12 NOTE — Unmapped (Signed)
Adolescent [facility] Young Adult Cancer Program Visit     Service date: March 11, 2022    Encounter location: Inpatient - Adult    Clinician: [name], LCSW - AYA Clinical Social Worker    Patient identifiers: [name] [name] is a 22 y.o. with desmoid tumors and [name]. [name] [name] uses she/her pronouns.     Visit Summary     Met with the patient for evaluation of needs that may be addressed by [facility].    Mental health - [name] [name] was relatively talkative and energetic at the beginning of our visit compared to my last visit with her, though still subdued from baseline. During visit, GI team came to speak with her about assessment and next steps of abdominal distension and pain. [name] [name] was very quiet while the team was present. Afterwards, her affect was markedly more flat, and though still engaged in visit with me, she was much less vocal. During this portion of the visit, she was intermittently tearful. She described feeling almost nothing most of the time lately, and that when she does have emotions, they are very strong and she does not know how to express them. She states that she has very little to say to anyone recently, and states that she feels she has nothing in her life to be happy about anymore. Again, all of this is significantly different from her baseline and has been an increasing trend over the past few months.    I provided additional psychoeducation on depression, emphasizing both situational appropriateness and likely contributing factors of poor health status, near-constant pain, malnutrition, and lack of sleep. Revisited conversation about changing mental health medication regimen. She expressed agreement with talking to a provider about antidepressants. She stated that her perspective on her mental health status has changed over the past month, and she feels more concerned about her mental health than she has before.     We discussed that her mental health status - in terms of both anxiety and depression - will likely improve when her health status and health-related QOL significantly improve. However, she acknowledges that that she is beginning to feel hopeless about health improvement and is struggling to imagine a future in which she feels physically and mentally well. We discussed the possibility that additional intervention in her mental health could improve her QOL as she continues on this potentially long road to improved physical health.  Fertility preservation - In setting of likely not resuming sorafenib and possibly initiating newly approved desmoid treatment with unknown fertility risk, revisited discussion of fertility preservation. She still has [name] [name]'s contact information and knows she needs to call her, but in addition to overwhelming circumstances recently, she admits that the topic of fertility is overwhelming and anxiety-provoking itself. I provided initial information on fertility preservation options to set expectations for conversations with [name] and others, which she found helpful. Will follow up later this week to see if she's able to connect with [name].       Follow Up/Plan:     - Will see this Friday for ongoing supportive counseling.    - Spoke with primary oncologist about recommendations for initiation of anti-depressant/coordination of care around mental health and fertility preservation.    - Reached out to outpatient psychiatrist Dr. Malcolm Metro.    I will continue to follow this patient for AYA-appropriate support. They have my contact information and have been encouraged to contact me as needed.     Flowsheet     AYA Assessment  Primary Caregiver: Parent  Location Seen: Inpatient  Contact Point: During treatment  Referral Source: Inpatient Consult  Issues Discussed: Fertility preservation, Family planning, Mental Health, Concerns with care or care team  AYA Team Interventions: Preliminary fertility preservation education / coordination, Psychoeducation, Goals of care support and communication, Supportive counseling, Care coordination  Referrals Made: Psychiatry, Fertility Preservation  Follow Up: As needed  Time Spent (in minutes): 70    03/12/2022     [name], LCSW  Adolescent and Young Adult Clinical Social Worker  Phone: [phone]  Email: [email or web address]

## 2022-03-12 NOTE — Unmapped (Signed)
Department of Anesthesiology  Pain Medicine Division    Chronic Pain Followup Inpatient Consult Note    Requesting Attending Physician:  [name], MD  Service Requesting Consult:  [facility] (MDW)    Assessment/Recommendations:  The patient was seen in consultation on request of [name], MD regarding assistance with pain management. The patient is obtaining adequate pain relief on current medication regimen.      The patient is a 22 y.o. 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 Chronic pain team has been consulted to assist with medication management as patient does see outpatient pain provider at [facility] pain clinic.       At this time, patient states that she has been having pain for the past 5 days.  Patient reports this pain is worse than her normal pain.  Patient states at home she was taking oxycodone 10 to 15 mg every 4 hours however this was not touching her pain.  She states at this time she is receiving IV Dilaudid every 3 hours which is helping her along with her heating pad.  We did also discuss starting a Butrans patch on the patient however she stated that she is not ready for this at this time.  She states she would like her pain to be well-controlled first before starting any new medications. Patient and her mother are curious to see if her current distention is related to any infection or mesenteric changes in her abdomen. We discussed that GI would be the best team to ask as they would know more. Once GI does evaluate the patient for any acute GI pathology, we may be able to titrate her medications.      Interval: Per last GI note with attestation from attending, they believe patient's recent bleed is from prior diagnosed ulcer and fissure. GI has suggested that patient may have component of SIBO vs. Decreased motility in the setting of opiates and would recommend weaning opioids. After discussion of findings with the patient, she is very frustrated as she states she is passing gas and stool. She states IV dilaudid has been helping to reset her body however we discussed that unfortunately this may be causing the additional distention that she may be feeling. We discussed that we would offer a weaning plan for the IV medication and ideally this would allow her abdominal pain to get better.    Recommendations:  -The chronic pain service is a consult service and does not place orders, just makes recommendations (except ketamine and lidocaine infusions)   -Please evaluate all patients on opioids for appropriateness of prescribing narcan at discharge.  The chronic pain service can assist with this.  Nasal narcan is covered by most insurances.  -Recommendations given apply to the current hospitalization and do not reflect long term recommendations.  - Continue Tylenol 1 g q8h  - Continue Baclofen 5 mg TID PRN  - Wean HM IV 1 mg q3h PRN to 0.5 mg q3h pRN  - Continue Lidocaine patch  - Encourage use of PO Oxycodone 10 mg q4h PRN     Home MME: 135  Current MME: 130    We will continue to follow.    -Medications trialed during this admission: see below    Naloxone Rx at discharge?  Is patient on opioids? Yes.  1)Is dose >50MME?  Yes.  2)  Is patient prescribed a benzodiazepine (w opioids)? No.  3)Hx of overdose?  No.  4) Hx of substance use disorder? No.  5) Opioids likely to last greater than a week after discharge? Yes.    If yes to 2 or more, prescribe naloxone at discharge.  Nasal narcan for most insured (Nasal narcan 4mg /actuation, prescribe 1 kit, instructions at [email or web address]).  For uninsured, chronic pain can work to assist in finding an option.  OTC nasal narcan now available at most pharmacies for around $45.    Interim History  There were no Acute Events Overnight.  The patient is obtaining adequate pain relief on current medication regimen and feels that their pain is Controlled    Today patient reports feeling okay. She states GI team talked to them about how they believe she may have a bacterial growth causing her abdomen to be distended.     Analgesia Evaluation:  Pain at minimum: 4/10  Pain at maximum: 8/10    Current pain medication regimen (including how frequent PRN's were used):  HM IV 1 mg q3h PRN (x3)  Oxycodone 10 mg q4h PRN (0)      Inpatient Medications  Current Facility-Administered Medications   Medication Dose Route Frequency Provider Last Rate Last Admin    acetaminophen (TYLENOL) tablet 1,000 mg  1,000 mg Oral Q8H PRN Hollie Beach P, DO        baclofen (LIORESAL) tablet 5 mg  5 mg Oral TID PRN Linna Hoff, MD        ciprofloxacin HCl (CIPRO) tablet 500 mg  500 mg Oral Q12H [facility] Durene Romans, MD   500 mg at 03/11/22 2122    clobetasol (TEMOVATE) 0.05 % ointment   Topical BID PRN Linna Hoff, MD        diclofenac sodium (VOLTAREN) 1 % gel 2 g  2 g Topical QID Linna Hoff, MD   2 g at 03/11/22 1738    diphenhydrAMINE (BENADRYL) capsule 50 mg  50 mg Oral Q6H PRN Hollie Beach P, DO   50 mg at 03/11/22 1737    famotidine (PEPCID) tablet 20 mg  20 mg Oral Nightly Alan Ripper, DO   20 mg at 03/11/22 2108    Parenteral Nutrition (CENTRAL)   Intravenous Continuous [name], MD 50 mL/hr at 03/12/22 0031 New Bag at 03/12/22 0031    And    fat emulsion 20 % with fish oil (SMOFlipid) infusion 250 mL  250 mL Intravenous Continuous [name], MD 20.8 mL/hr at 03/12/22 0030 250 mL at 03/12/22 0030    gabapentin (NEURONTIN) capsule 200 mg  200 mg Oral Nightly Linna Hoff, MD   200 mg at 03/11/22 2107    HYDROmorphone (PF) (DILAUDID) injection 1 mg  1 mg Intravenous Q3H PRN Durene Romans, MD   1 mg at 03/12/22 0415    hyoscyamine (LEVSIN) tablet 125 mcg  125 mcg Oral Q4H PRN Hollie Beach P, DO        lidocaine 4 % patch 2 patch  2 patch Transdermal Daily Linna Hoff, MD   2 patch at 03/11/22 0802    melatonin tablet 3 mg  3 mg Oral Nightly PRN Linna Hoff, MD        mirtazapine (REMERON) tablet 7.5 mg  7.5 mg Oral Nightly Alan Ripper, DO   7.5 mg at 03/11/22 2108    naloxone [facility]) injection 0.4 mg  0.4 mg Intravenous Once PRN  Linna Hoff, MD        OLANZapine zydis (ZyPREXA) disintegrating tablet 7.5 mg  7.5 mg Oral Nightly Alan Ripper, DO   7.5 mg at 03/11/22 2122    ondansetron (ZOFRAN-ODT) disintegrating tablet 8 mg  8 mg Oral Q12H PRN Hollie Beach P, DO        oxyCODONE (ROXICODONE) immediate release tablet 10 mg  10 mg Oral Q4H PRN Durene Romans, MD             Objective:     Vital Signs    Temp:  [36.6 ??C (97.9 ??F)-36.9 ??C (98.4 ??F)] 36.7 ??C (98.1 ??F)  Heart Rate:  [81-93] 84  Resp:  [16-18] 18  BP: (102-113)/(56-65) 102/65  MAP (mmHg):  [71-79] 78  SpO2:  [97 %-99 %] 99 %      Physical Exam    GENERAL:  Well developed, well-nourished female and is in no apparent distress.   HEAD/NECK:    Reveals normocephalic/atraumatic.   CARDIOVASCULAR:   Warm, well perfused  LUNGS:   Normal work of breathing, no supplemental 02  EXTREMITIES:  Warm, no clubbing, cyanosis, or edema was noted.  NEUROLOGIC:    The patient was alert and oriented times four with normal language, attention, cognition and memory. Cranial nerve exam was grossly normal.    MUSCULOSKELETAL:    Patient moving all extremities in bed.   ABDOMEN:  Tender, distended.   SKIN:  No obvious rashes lesions or erythema  PSY:  Appropriate affect and mood.    Test Results    Lab Results   Component Value Date    CREATININE 0.51 (L) 03/12/2022     Lab Results   Component Value Date    ALKPHOS 44 (L) 03/12/2022    BILITOT <0.2 (L) 03/12/2022    BILIDIR <0.10 03/12/2022    PROT 6.1 03/12/2022    ALBUMIN 3.1 (L) 03/12/2022    ALT 8 (L) 03/12/2022    AST 14 03/12/2022           Problem List    Principal Problem:    GI bleeding  Active Problems:    [name] [name]    Pain    Iron deficiency anemia due to chronic blood loss    Severe protein-calorie malnutrition (CMS-HCC)    Lower abdominal pain    Generalized anxiety disorder with panic attacks

## 2022-03-12 NOTE — Unmapped (Signed)
Neuro: A&OX4     Resp: RA     GI/GU: Voiding, blood in stool     Skin: double lumen Port     Pain: PRN's     Goal: Call bell within reach, all questions answered at this time, mom at bedside     Problem: Adult Inpatient Plan of Care  Goal: Plan of Care Review  Outcome: Progressing  Goal: Patient-Specific Goal (Individualized)  Outcome: Progressing  Goal: Absence of Hospital-Acquired Illness or Injury  Outcome: Progressing  Intervention: Identify and Manage Fall Risk  Recent Flowsheet Documentation  Taken 03/11/2022 1223 by Jennette Bill, RN  Safety Interventions:   environmental modification   fall reduction program maintained   family at bedside   infection management   lighting adjusted for tasks/safety   low bed  Intervention: Prevent Skin Injury  Recent Flowsheet Documentation  Taken 03/11/2022 1800 by Jennette Bill, RN  Positioning for Skin: Supine/Back  Device Skin Pressure Protection:   absorbent pad utilized/changed   adhesive use limited  Skin Protection: adhesive use limited  Taken 03/11/2022 1600 by Jennette Bill, RN  Positioning for Skin: Supine/Back  Device Skin Pressure Protection:   absorbent pad utilized/changed   adhesive use limited  Skin Protection: adhesive use limited  Taken 03/11/2022 1400 by Jennette Bill, RN  Positioning for Skin: Supine/Back  Device Skin Pressure Protection:   absorbent pad utilized/changed   adhesive use limited  Skin Protection: adhesive use limited  Taken 03/11/2022 1223 by Jennette Bill, RN  Positioning for Skin: Supine/Back  Device Skin Pressure Protection:   absorbent pad utilized/changed   adhesive use limited  Skin Protection: adhesive use limited  Intervention: Prevent and Manage VTE (Venous Thromboembolism) Risk  Recent Flowsheet Documentation  Taken 03/11/2022 1800 by Jennette Bill, RN  Anti-Embolism Intervention: Refused  Taken 03/11/2022 1600 by Jennette Bill, RN  Anti-Embolism Intervention: Refused  Taken 03/11/2022 1400 by Jennette Bill, RN  Anti-Embolism Intervention: Refused  Taken 03/11/2022 1223 by Jennette Bill, RN  VTE Prevention/Management:   ambulation promoted   dorsiflexion/plantar flexion performed  Anti-Embolism Intervention: Refused  Intervention: Prevent Infection  Recent Flowsheet Documentation  Taken 03/11/2022 1223 by Jennette Bill, RN  Infection Prevention:   equipment surfaces disinfected   hand hygiene promoted  Goal: Optimal Comfort and Wellbeing  Outcome: Progressing  Goal: Readiness for Transition of Care  Outcome: Progressing  Goal: Rounds/Family Conference  Outcome: Progressing     Problem: Latex Allergy  Goal: Absence of Allergy Symptoms  Outcome: Progressing     Problem: Gastrointestinal Bleeding  Goal: Optimal Coping with Acute Illness  Outcome: Progressing  Goal: Hemostasis  Outcome: Progressing     Problem: Pain Acute  Goal: Optimal Pain Control and Function  Outcome: Progressing

## 2022-03-12 NOTE — Unmapped (Signed)
[name] [name] has been contacted in regards to their refill of Sorafenib . At this time, they have declined refill due to  medication being discontinued .

## 2022-03-13 LAB — CBC W/ AUTO DIFF
BASOPHILS ABSOLUTE COUNT: 0 10*9/L (ref 0.0–0.1)
BASOPHILS RELATIVE PERCENT: 0.5 %
EOSINOPHILS ABSOLUTE COUNT: 0.2 10*9/L (ref 0.0–0.5)
EOSINOPHILS RELATIVE PERCENT: 2.2 %
HEMATOCRIT: 27 % — ABNORMAL LOW (ref 34.0–44.0)
HEMOGLOBIN: 9.1 g/dL — ABNORMAL LOW (ref 11.3–14.9)
LYMPHOCYTES ABSOLUTE COUNT: 2.3 10*9/L (ref 1.1–3.6)
LYMPHOCYTES RELATIVE PERCENT: 29.1 %
MEAN CORPUSCULAR HEMOGLOBIN CONC: 33.8 g/dL (ref 32.0–36.0)
MEAN CORPUSCULAR HEMOGLOBIN: 27.7 pg (ref 25.9–32.4)
MEAN CORPUSCULAR VOLUME: 82 fL (ref 77.6–95.7)
MEAN PLATELET VOLUME: 9.7 fL (ref 6.8–10.7)
MONOCYTES ABSOLUTE COUNT: 0.8 10*9/L (ref 0.3–0.8)
MONOCYTES RELATIVE PERCENT: 10.6 %
NEUTROPHILS ABSOLUTE COUNT: 4.6 10*9/L (ref 1.8–7.8)
NEUTROPHILS RELATIVE PERCENT: 57.6 %
PLATELET COUNT: 267 10*9/L (ref 150–450)
RED BLOOD CELL COUNT: 3.3 10*12/L — ABNORMAL LOW (ref 3.95–5.13)
RED CELL DISTRIBUTION WIDTH: 14.9 % (ref 12.2–15.2)
WBC ADJUSTED: 8 10*9/L (ref 3.6–11.2)

## 2022-03-13 LAB — BASIC METABOLIC PANEL
ANION GAP: 8 mmol/L (ref 5–14)
BLOOD UREA NITROGEN: 13 mg/dL (ref 9–23)
BUN / CREAT RATIO: 27
CALCIUM: 8.7 mg/dL (ref 8.7–10.4)
CHLORIDE: 105 mmol/L (ref 98–107)
CO2: 29 mmol/L (ref 20.0–31.0)
CREATININE: 0.48 mg/dL — ABNORMAL LOW
EGFR CKD-EPI (2021) FEMALE: >90 mL/min/{1.73_m2} (ref >=60)
GLUCOSE RANDOM: 116 mg/dL (ref 70–179)
POTASSIUM: 3.6 mmol/L (ref 3.4–4.8)
SODIUM: 142 mmol/L (ref 135–145)

## 2022-03-13 LAB — PHOSPHORUS: PHOSPHORUS: 4.2 mg/dL (ref 2.4–5.1)

## 2022-03-13 LAB — MAGNESIUM: MAGNESIUM: 2 mg/dL (ref 1.6–2.6)

## 2022-03-13 MED ADMIN — HYDROmorphone (PF) injection Syrg 0.5 mg: .5 mg | INTRAVENOUS | @ 12:00:00 | Stop: 2022-03-13

## 2022-03-13 MED ADMIN — Parenteral Nutrition (CENTRAL): INTRAVENOUS | @ 05:00:00 | Stop: 2022-03-14

## 2022-03-13 MED ADMIN — oxyCODONE (ROXICODONE) immediate release tablet 10 mg: 10 mg | ORAL | @ 22:00:00 | Stop: 2022-03-25

## 2022-03-13 MED ADMIN — acetaminophen (TYLENOL) tablet 1,000 mg: 1000 mg | ORAL | @ 22:00:00

## 2022-03-13 MED ADMIN — lidocaine 4 % patch 2 patch: 2 | TRANSDERMAL | @ 14:00:00

## 2022-03-13 MED ADMIN — HYDROmorphone (PF) injection Syrg 0.5 mg: .5 mg | INTRAVENOUS | @ 02:00:00 | Stop: 2022-03-13

## 2022-03-13 MED ADMIN — HYDROmorphone (PF) injection Syrg 0.5 mg: .5 mg | INTRAVENOUS | @ 05:00:00 | Stop: 2022-03-13

## 2022-03-13 MED ADMIN — HYDROmorphone (PF) injection Syrg 0.5 mg: .5 mg | INTRAVENOUS | @ 19:00:00 | Stop: 2022-03-14

## 2022-03-13 MED ADMIN — HYDROmorphone (PF) injection Syrg 0.5 mg: .5 mg | INTRAVENOUS | @ 09:00:00 | Stop: 2022-03-13

## 2022-03-13 MED ADMIN — acetaminophen (TYLENOL) tablet 1,000 mg: 1000 mg | ORAL | @ 02:00:00

## 2022-03-13 MED ADMIN — OLANZapine zydis (ZyPREXA) disintegrating tablet 7.5 mg: 7.5 mg | ORAL | @ 02:00:00 | Stop: 2022-03-17

## 2022-03-13 MED ADMIN — ciprofloxacin HCl (CIPRO) tablet 500 mg: 500 mg | ORAL | @ 14:00:00 | Stop: 2022-04-08

## 2022-03-13 MED ADMIN — fat emulsion 20 % with fish oil (SMOFlipid) infusion 250 mL: 250 mL | INTRAVENOUS | @ 05:00:00 | Stop: 2022-03-13

## 2022-03-13 MED ADMIN — ciprofloxacin HCl (CIPRO) tablet 500 mg: 500 mg | ORAL | @ 02:00:00 | Stop: 2022-04-08

## 2022-03-13 MED ADMIN — gabapentin (NEURONTIN) capsule 200 mg: 200 mg | ORAL | @ 02:00:00

## 2022-03-13 MED ADMIN — diclofenac sodium (VOLTAREN) 1 % gel 2 g: 2 g | TOPICAL | @ 02:00:00

## 2022-03-13 MED ADMIN — oxyCODONE (ROXICODONE) immediate release tablet 10 mg: 10 mg | ORAL | @ 18:00:00 | Stop: 2022-03-25

## 2022-03-13 MED ADMIN — famotidine (PEPCID) tablet 20 mg: 20 mg | ORAL | @ 02:00:00 | Stop: 2022-03-17

## 2022-03-13 MED ADMIN — acetaminophen (TYLENOL) tablet 1,000 mg: 1000 mg | ORAL | @ 14:00:00

## 2022-03-13 MED ADMIN — oxyCODONE (ROXICODONE) immediate release tablet 10 mg: 10 mg | ORAL | @ 04:00:00 | Stop: 2022-03-25

## 2022-03-13 MED ADMIN — mirtazapine (REMERON) tablet 7.5 mg: 7.5 mg | ORAL | @ 02:00:00

## 2022-03-13 MED ADMIN — oxyCODONE (ROXICODONE) immediate release tablet 10 mg: 10 mg | ORAL | @ 14:00:00 | Stop: 2022-03-25

## 2022-03-13 MED ADMIN — diclofenac sodium (VOLTAREN) 1 % gel 2 g: 2 g | TOPICAL | @ 18:00:00

## 2022-03-13 NOTE — Unmapped (Signed)
Problem: Adult Inpatient Plan of Care  Goal: Plan of Care Review  Outcome: Ongoing - Unchanged  Flowsheets (Taken 03/13/2022 0636)  Progress: no change  Outcome Evaluation: Pt AOX4 and following all commands. RA. Up ad lib. No BMs during shift. Mom at bedside and updated. Pain controlled with prn medication. No acute changes overnight.  Plan of Care Reviewed With: patient  Goal: Patient-Specific Goal (Individualized)  Outcome: Ongoing - Unchanged  Goal: Absence of Hospital-Acquired Illness or Injury  Outcome: Ongoing - Unchanged  Intervention: Identify and Manage Fall Risk  Recent Flowsheet Documentation  Taken 03/13/2022 0000 by Shelia Media, RN  Safety Interventions:   bed alarm   fall reduction program maintained   family at bedside   lighting adjusted for tasks/safety   low bed   nonskid shoes/slippers when out of bed   supervised activity  Intervention: Prevent Skin Injury  Recent Flowsheet Documentation  Taken 03/13/2022 0600 by Shelia Media, RN  Positioning for Skin: Supine/Back  Taken 03/13/2022 0400 by Shelia Media, RN  Positioning for Skin: Supine/Back  Taken 03/13/2022 0200 by Shelia Media, RN  Positioning for Skin: Supine/Back  Taken 03/13/2022 0000 by Shelia Media, RN  Positioning for Skin: Supine/Back  Device Skin Pressure Protection:   tubing/devices free from skin contact   adhesive use limited  Skin Protection: adhesive use limited  Intervention: Prevent Infection  Recent Flowsheet Documentation  Taken 03/13/2022 0000 by Shelia Media, RN  Infection Prevention:   environmental surveillance performed   equipment surfaces disinfected   hand hygiene promoted   personal protective equipment utilized   rest/sleep promoted   single patient room provided   visitors restricted/screened  Goal: Optimal Comfort and Wellbeing  Outcome: Ongoing - Unchanged  Goal: Readiness for Transition of Care  Outcome: Ongoing - Unchanged  Goal: Rounds/Family Conference  Outcome: Ongoing - Unchanged     Problem: Latex Allergy  Goal: Absence of Allergy Symptoms  Outcome: Ongoing - Unchanged     Problem: Gastrointestinal Bleeding  Goal: Optimal Coping with Acute Illness  Outcome: Ongoing - Unchanged  Goal: Hemostasis  Outcome: Ongoing - Unchanged     Problem: Pain Acute  Goal: Optimal Pain Control and Function  Outcome: Ongoing - Unchanged

## 2022-03-13 NOTE — Unmapped (Signed)
[facility]  Initial Psychiatry Consult Note     Service Date: March 13, 2022  LOS:  LOS: 1 day      Assessment:   [name] [name] is a 22 y.o. 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    Patient was seen in consultation by Psychiatry at the request of [name], MD with [facility] Echevaria (MDW) for evaluation of  medication recommendations/starting an SSRI .     The patient's presentation, including presence of multiple worries that are difficult to control, sleep disturbance, fatigue/low energy, and impaired concentration appears to be most consistent with a diagnosis of well-documented diagnosis/diagnoses of generalized anxiety disorder. Patient also has known Major Depressive Disorder characterized by reports of depressed mood, sleep disturbance, low energy, impaired concentration. Also consider depression/anxiety due to acute severe medical conditions. Per Chart Review, patient has been established with [facility] since 11/07/2021 who has documented patient's intolerance of higher dose of Remeron beyond 7.5mg . They have also started and titrated Olanzapine for anxiety, sleep, and appetite. There's also a concern that patient's poor nutrition status is contributing to her withdrawn state, low energy, and some other somatic concerns. Regarding primary team's question of potentially starting an SSRI- we strongly recommend against starting at SSRI given their property to inhibit platelet aggregation and further worsen her ongoing GI Bleed. While SSRI trial could potentially be pursued in the future in non-acute setting, we will defer to outpatient psychiatry team for extensive risks/benefits/alternatives discussion with patient given her severe medical conditions which now include known GIB.     In our visit today, we spoke with patient and her mother at length regarding risks/benefits/alternatives of management of depression/anxiety in this acute setting. Shared-decision making with patient to slightly increase Olanzapine as below. We have also liaised with her outpatient psychiatrist who has shared valuable clinical input and will see patient post-discharge.      Please see below for detailed recommendations.    Diagnoses:   Active Hospital problems:  Principal Problem:    GI bleeding  Active Problems:    [name]    [name]    Pain    Iron deficiency anemia due to chronic blood loss    Severe protein-calorie malnutrition (CMS-HCC)    Lower abdominal pain    Generalized anxiety disorder with panic attacks    MDD (major depressive disorder)     Problems edited/added by me:  Problem   Mdd (Major Depressive Disorder)       Safety Risk Assessment:  Full psychiatric evaluation including risk assessment was not required based on ASQ or BSA (ex: ASQ was unable to be completed or no intervention is necessary, or BSA was scored as low risk), but a suicide and violence risk assessment was performed as part of this evaluation. Specific questioning about thoughts, plans, suicidal intent, and self-harm: Denies thoughts of self-harm. Denies suicidal ideation, plans, or intent. . Risk factors for self-harm/suicide: sense of isolation, current diagnosis of depression, and chronic severe medical condition. Protective factors against self-harm/suicide:  lack of active SI, no known access to weapons or firearms, no history of previous suicide attempts , no previous suicide attempts in the last 6 months, motivation  for treatment, currently receiving mental health treatment, has access to clinical interventions and support, utilization of positive coping skills, supportive family, sense of responsibility to family and social supports, presence of an available support system, employment or functioning in a structured work/academic setting, enjoyment of leisure actvities, expresses purpose for living, effective problem solving skills, and safe housing. Risk factors for harm to others: high emotional distress and unresponsive to treatment. Protective factors against harm to others: no known history of violence towards others, no known violence towards others in the last 6 months, no known history of threats of harm towards others, no known homicidal ideation in the last 6 months, no commands hallucinations to harm others in the last 6 months, no active symptoms of psychosis, no active symptoms of mania, no previous acts of violence in current setting, high intellectual functioning, positive social orientation, and connectedness to family. Based on my clinical evaluation, I estimate the patient to be at low risk for suicide in the current setting.     Recommendations:   ## Safety and Observation Level:   -- Based on the BSA or psychiatric evaluation, we estimate the patient to be at low risk for suicide in the current setting. We recommend routine level of observation on medical unit. This decision is based on my review of the chart including patient's history and current presentation, interview of the patient, mental status examination, and consideration of suicide risk including evaluating suicidal ideation, plan, intent, suicidal or self-harm behaviors, risk factors, and protective factors. This judgment is based on our ability to directly address suicide risk, implement suicide prevention strategies and develop a safety plan while the patient is in the clinical setting.   -- If the patient attempts to leave against medical advice and it is felt to be unsafe for them to leave, please call a Behavioral Response and page Psychiatry at [phone].  -- Please contact our team if there is a concern that risk level has changed.    ## Medications:   -- START Olanzapine 1.25 mg as needed daily for acute anxiety  -- CONTINUE home Olanzapine 7.5 mg at bedtime  -- CONTINUE home Mirtazapine 7.5 mg at bedtime  -- CONTINUE Gabapentin 200 mg nightly    ## Medical Decision Making Capacity:   -- A formal capacity assessement was not performed as a part of this evaluation.  If specific capacity questions arise, please contact our team as below.     ## Further Work-up:   -- Continue to work-up and treat possible medical conditions that may be contributing to current presentation.   -- While the patient is receiving medications (such as Olanzapine) that may prolong QTc and increase risk for torsades:     - MONITOR and KEEP Mg>2 and K>4      - MONITOR QTc regularly.  If QTc on tele strip >479ms, obtain 12-lead EKG.    ## Disposition:   -- When patient is discharged, please ensure that their AVS includes information about the 43 Suicide & Crisis Lifeline.  -- There are no psychiatric contraindications to discharging this patient when medically appropriate.  -- The patient will follow-up with Dr. Kathrine Haddock [facility] Psychiatry Fellow) for mental health care at the time of discharge.    ## Behavioral / Environmental:   -- Although not currently delirious, the patient is at an elevated risk for developing delirium. Please utilize delirium prevention protocol.    Thank you for this consult request. Recommendations have been communicated to the  primary team.  We will follow as needed at this time. Please page [phone] for any questions or concerns.     Discussed with and seen by Attending, Fanny Skates, MD, who agrees with the assessment and plan.    Asif Crista Luria, MD    I personally spent 60 minutes face-to-face and non-face-to-face in the care of this patient, which includes all pre, intra, and post visit time on the date of service.  All documented time was specific to the E/M visit and does not include any procedures that may have been performed.   I saw and evaluated the patient, participating in the key portions of the service.  I reviewed and edited the resident???s note.  I agree with the resident???s findings and plan.   Fanny Skates, MD    History:   Relevant Aspects of Hospital Course:   Admitted on 03/10/2022 for GI bleed. Ongoing small-volume BRBPR. Hgb stable at 9.1. Chronic pain following and managing pain. Primary team was messaged by [name] SW- recommending starting an SSRI for multifactorial anxiety/depression. High bleeding risk. No acute safety concerns at this time. GI and Onc has signed off. Lots of health and medication anxiety. Primary team will discontinue IV pain medicine tomorrow and monitor hgb closely, if it remains stable, potential discharge over the weekend.     Patient Report:   Patient reports she has not been doing very well. Has been trying to work through [employer], difficult to have her questions answered, doesn't know what's going on. According to mom, they cant see drs daily progress notes anymore, only the nurses. Used to have access to them. Would be very helpful if they could get access to them.  She would like the main team to visit so she can talk with them. Patient says psych meds are going well but she has alot of anxiety, even before admission. Last time she talked with Dr. Malcolm Metro a while ago, Olanizipie dose was increased then.     Patient endorses depression because she feels alone and its hard to get up in the AM. Denies suicidal ideation. Mirtazipine has been helpful. She tried 15mg  of Remeron for a month but that made her too sleep she would sleep the whole day. Made her sleeply earlier when she took it earlier in the morning.  Patient does not think Olanzipine makes her tired, takes it at night. Has tried Cymbalta, which made her cry a lot.    Patient would like a follow up patient with Dr. Malcolm Metro after hospitalization. Discussed strategies on mildly increasing Olanzapine.     Patient reports she's on oxycodone, was told she couldn't take Ativan for anxiety concurrently and wonders if she can start Ativan if/after oxycodone is tapered off. Discussed long-term risks of Ativan for her which outweighs the potential short-term relief. Mom reports that patient has a lot of fear about being in pain, very fearful about her pain meds being taken away. Patient reports she  is in a lot of pain now, says she does not have adequate pain management.  Patient alsoreports she has nausea has not been eating because of the nausea.     ROS:   All systems reviewed as negative/unremarkable aside from the following pertinent positives and negatives: except as above    Collateral information:   - Reviewed medical records in Epic  - Spoke to patient's mother see above interview where mom was present and actively participated during the interview    Psychiatric History:  Information collected from patient interview and collateral.  Prior psychiatric diagnoses: GAD, MDD  Psychiatric hospitalizations: none  Substance abuse treatment: none  Suicide attempts / Non-suicidal self-injury: none  Medication trials/compliance: Duloxetine (not helpful, worsening depression), Remeron, Olanzapine, Amitriptyline   Current OP psychiatric medication regimen: Remeron 7.5mg , Olanzapine 7.5mg   Current psychiatrist: Kathrine Haddock  Current therapist: [name]    Family History:   The patient's family history includes Alcohol abuse in her paternal grandfather; Arthritis in her maternal grandfather; Asthma in her maternal grandfather; COPD in her paternal grandmother; Cancer in her maternal grandmother; Diabetes in her maternal grandmother; Hypertension in her maternal grandmother; Miscarriages / [place] in her paternal grandmother; No Known Problems in her brother, father, maternal aunt, maternal uncle, mother, paternal aunt, paternal uncle, and sister; Other in her maternal grandmother; Stroke in her maternal grandmother; Thyroid disease in her maternal grandmother..  No family history of bipolar disorder or depression.     Medical History:  Past Medical History:   Diagnosis Date    Abdominal pain     Acid reflux     occas    Anesthesia complication     itching, shaking, coldness; last few surgeries have gone much better    Cancer (CMS-HCC)     Cataract of right eye     COVID-19 virus infection 01/2019    Cyst of thyroid determined by ultrasound     monitoring    [name]     2 right forearm, 1 left thigh, 1 right scapula, 1 under left clavicle; multiple    Difficult intravenous access     [name] (familial adenomatous polyposis)     [name]     Gastric polyps     History of chemotherapy     last treatment approx 05/2019    History of colon polyps     History of COVID-19 01/2019    Iron deficiency anemia due to chronic blood loss     received iron infusion 11-2019    PONV (postoperative nausea and vomiting)     Rectal bleeding     Syncopal episodes     especially if becoming dehydrated       Surgical History:  Past Surgical History:   Procedure Laterality Date    cyroablation      cystis removal      desmoid removal      PR CLOSE ENTEROSTOMY,RESEC+ANAST N/A 10/09/2020    Procedure: ILEOSTOMY TAKEDOWN;  Surgeon: Mickle Asper, MD;  Location: OR ;  Service: General Surgery    PR COLONOSCOPY W/BIOPSY SINGLE/MULTIPLE N/A 10/27/2012    Procedure: COLONOSCOPY, FLEXIBLE, PROXIMAL TO SPLENIC FLEXURE; WITH BIOPSY, SINGLE OR MULTIPLE;  Surgeon: Shirlyn Goltz Mir, MD;  Location: PEDS PROCEDURE ROOM [facility];  Service: Gastroenterology    PR COLONOSCOPY W/BIOPSY SINGLE/MULTIPLE N/A 09/14/2013    Procedure: COLONOSCOPY, FLEXIBLE, PROXIMAL TO SPLENIC FLEXURE; WITH BIOPSY, SINGLE OR MULTIPLE;  Surgeon: Shirlyn Goltz Mir, MD;  Location: PEDS PROCEDURE ROOM ;  Service: Gastroenterology    PR COLONOSCOPY W/BIOPSY SINGLE/MULTIPLE N/A 11/08/2014    Procedure: COLONOSCOPY, FLEXIBLE, PROXIMAL TO SPLENIC FLEXURE; WITH BIOPSY, SINGLE OR MULTIPLE;  Surgeon: Arnold Long Mir, MD;  Location: PEDS PROCEDURE ROOM [facility];  Service: Gastroenterology    PR COLONOSCOPY W/BIOPSY SINGLE/MULTIPLE N/A 12/26/2015    Procedure: COLONOSCOPY, FLEXIBLE, PROXIMAL TO SPLENIC FLEXURE; WITH BIOPSY, SINGLE OR MULTIPLE;  Surgeon: Arnold Long Mir, MD;  Location: PEDS PROCEDURE ROOM [facility];  Service: Gastroenterology    PR COLONOSCOPY W/BIOPSY SINGLE/MULTIPLE N/A 09/02/2017  Procedure: COLONOSCOPY, FLEXIBLE, PROXIMAL TO SPLENIC FLEXURE; WITH BIOPSY, SINGLE OR MULTIPLE;  Surgeon: Arnold Long Mir, MD;  Location: PEDS PROCEDURE ROOM [facility];  Service: Gastroenterology    PR COLSC FLX W/REMOVAL LESION BY HOT BX FORCEPS N/A 08/27/2016    Procedure: COLONOSCOPY, FLEXIBLE, PROXIMAL TO SPLENIC FLEXURE; W/REMOVAL TUMOR/POLYP/OTHER LESION, HOT BX FORCEP/CAUTE;  Surgeon: Arnold Long Mir, MD;  Location: PEDS PROCEDURE ROOM [facility];  Service: Gastroenterology    PR COLSC FLX W/RMVL OF TUMOR POLYP LESION SNARE TQ N/A 02/25/2019    Procedure: COLONOSCOPY FLEX; W/REMOV TUMOR/LES BY SNARE;  Surgeon: Helyn Numbers, MD;  Location: [facility] PROCEDURES [place] [facility];  Service: Gastroenterology    PR COLSC FLX W/RMVL OF TUMOR POLYP LESION SNARE TQ N/A 03/13/2020    Procedure: COLONOSCOPY FLEX; W/REMOV TUMOR/LES BY SNARE;  Surgeon: Helyn Numbers, MD;  Location: [facility]EDURES [place] [facility];  Service: Gastroenterology    PR EXC SKIN BENIG 2.1-3 CM TRUNK,ARM,LEG Right 02/25/2017    Procedure: EXCISION, BENIGN LESION INCLUDE MARGINS, EXCEPT SKIN TAG, LEGS; EXCISED DIAMETER 2.1 TO 3.0 CM;  Surgeon: Clarene Duke, MD;  Location: CHILDRENS OR [facility];  Service: Plastics    PR EXC SKIN BENIG 3.1-4 CM TRUNK,ARM,LEG Right 02/25/2017    Procedure: EXCISION, BENIGN LESION INCLUDE MARGINS, EXCEPT SKIN TAG, ARMS; EXCISED DIAMETER 3.1 TO 4.0 CM;  Surgeon: Clarene Duke, MD;  Location: CHILDRENS OR [facility];  Service: Plastics    PR EXC SKIN BENIG >4 CM FACE,FACIAL Right 02/25/2017    Procedure: EXCISION, OTHER BENIGN LES INCLUD MARGIN, FACE/EARS/EYELIDS/NOSE/LIPS/MUCOUS MEMBRANE; EXCISED DIAM >4.0 CM;  Surgeon: Clarene Duke, MD;  Location: CHILDRENS OR [facility];  Service: Plastics    PR EXC TUMOR SOFT TISSUE LEG/ANKLE SUBQ 3+CM Right 08/05/2019    Procedure: EXCISION, TUMOR, SOFT TISSUE OF LEG OR ANKLE AREA, SUBCUTANEOUS; 3 CM OR GREATER;  Surgeon: Arsenio Katz, MD;  Location: MAIN OR [place];  Service: Plastics    PR EXC TUMOR SOFT TISSUE LEG/ANKLE SUBQ <3CM Right 08/05/2019    Procedure: EXCISION, TUMOR, SOFT TISSUE OF LEG OR ANKLE AREA, SUBCUTANEOUS; LESS THAN 3 CM;  Surgeon: Arsenio Katz, MD;  Location: MAIN OR [facility];  Service: Plastics    PR LAP, SURG PROCTECTOMY W J-POUCH N/A 08/10/2020    Procedure: ROBOTIC ASSISTED LAPAROSCOPIC PROCTOCOLECTOMY, ILEAL J POUCH, WITH OSTOMY;  Surgeon: Mickle Asper, MD;  Location: OR [place];  Service: General Surgery    PR NDSC EVAL INTSTINAL POUCH DX W/COLLJ SPEC SPX N/A 01/23/2021    Procedure: ENDO EVAL SM INTEST POUCH; DX;  Surgeon: Modena Nunnery, MD;  Location: [facility] PROCEDURES [place] [facility];  Service: Gastroenterology    PR NDSC EVAL INTSTINAL POUCH DX W/COLLJ SPEC SPX N/A 08/27/2021    Procedure: ENDO EVAL SM INTEST POUCH; DX;  Surgeon: Hunt Oris, MD;  Location: [facility]URES [facility];  Service: Gastroenterology    PR NDSC EVAL INTSTINAL POUCH DX W/COLLJ SPEC SPX N/A 12/09/2021    Procedure: ENDO EVAL SM INTEST POUCH; DX;  Surgeon: Vidal Schwalbe, MD;  Location: [facility] PROC[facility]URES [facility];  Service: Gastroenterology    PR NDSC EVAL INTSTINAL POUCH W/BX SINGLE/MULTIPLE N/A 01/20/2022    Procedure: ENDOSCOPIC EVAL OF SMALL INTESTINAL POUCH; DIAGNOSTIC, No biopsies;  Surgeon: Andrey Farmer, MD;  Location: GI PROCEDURES [facility];  Service: Gastroenterology    PR NDSC EVAL INTSTINAL POUCH W/BX SINGLE/MULTIPLE N/A 02/13/2022    Procedure: ENDOSCOPIC EVAL OF SMALL INTESTINAL POUCH; DIAGNOSTIC, WITH BIOPSY;  Surgeon: Bronson Curb, MD;  Location: GI PROCEDURES [facility];  Service: Gastroenterology    PR UNLISTED PROCEDURE SMALL INTESTINE  01/23/2021    Procedure: UNLISTED PROCEDURE, SMALL INTESTINE;  Surgeon: Modena Nunnery, MD;  Location: [facility] PROCEDURES [place] [facility];  Service: Gastroenterology    PR UNLISTED PROCEDURE SMALL INTESTINE  02/13/2022    Procedure: UNLISTED PROCEDURE, SMALL INTESTINE;  Surgeon: Bronson Curb, MD;  Location: [facility] PROC[facility]URES [facility];  Service: Gastroenterology    PR UPPER GI ENDOSCOPY,BIOPSY N/A 10/27/2012    Procedure: UGI ENDOSCOPY; WITH BIOPSY, SINGLE OR MULTIPLE;  Surgeon: Shirlyn Goltz Mir, MD;  Location: PEDS PROCEDURE ROOM [facility];  Service: Gastroenterology    PR UPPER GI ENDOSCOPY,BIOPSY N/A 09/14/2013    Procedure: UGI ENDOSCOPY; WITH BIOPSY, SINGLE OR MULTIPLE;  Surgeon: Shirlyn Goltz Mir, MD;  Location: PEDS PROCEDURE ROOM [facility];  Service: Gastroenterology    PR UPPER GI ENDOSCOPY,BIOPSY N/A 11/08/2014    Procedure: UGI ENDOSCOPY; WITH BIOPSY, SINGLE OR MULTIPLE;  Surgeon: Arnold Long Mir, MD;  Location: PEDS PROCEDURE ROOM [facility];  Service: Gastroenterology    PR UPPER GI ENDOSCOPY,BIOPSY N/A 12/26/2015    Procedure: UGI ENDOSCOPY; WITH BIOPSY, SINGLE OR MULTIPLE;  Surgeon: Arnold Long Mir, MD;  Location: PEDS PROCEDURE ROOM [facility];  Service: Gastroenterology    PR UPPER GI ENDOSCOPY,BIOPSY N/A 08/27/2016    Procedure: UGI ENDOSCOPY; WITH BIOPSY, SINGLE OR MULTIPLE;  Surgeon: Arnold Long Mir, MD;  Location: PEDS PROCEDURE ROOM [facility];  Service: Gastroenterology    PR UPPER GI ENDOSCOPY,BIOPSY N/A 09/02/2017    Procedure: UGI ENDOSCOPY; WITH BIOPSY, SINGLE OR MULTIPLE;  Surgeon: Arnold Long Mir, MD;  Location: PEDS PROCEDURE ROOM [facility];  Service: Gastroenterology    PR UPPER GI ENDOSCOPY,BIOPSY N/A 03/13/2020    Procedure: UGI ENDOSCOPY; WITH BIOPSY, SINGLE OR MULTIPLE;  Surgeon: Helyn Numbers, MD;  Location: [facility] PROCEDURES [place] [facility];  Service: Gastroenterology    PR UPPER GI ENDOSCOPY,BIOPSY N/A 09/05/2021    Procedure: UGI ENDOSCOPY; WITH BIOPSY, SINGLE OR MULTIPLE;  Surgeon: Wendall Papa, MD;  Location: GI PROC[facility]URES [facility];  Service: Gastroenterology    PR UPPER GI ENDOSCOPY,DIAGNOSIS N/A 01/20/2022    Procedure: UGI ENDO, INCLUDE ESOPHAGUS, STOMACH, & DUODENUM &/OR JEJUNUM; DX W/WO COLLECTION SPECIMN, BY BRUSH OR WASH;  Surgeon: Andrey Farmer, MD;  Location: GI PROCEDURES [facility];  Service: Gastroenterology    TUMOR REMOVAL      multiple-head, neck, back, hand, right flank, multiple       Medications:     Current Facility-Administered Medications:     acetaminophen (TYLENOL) tablet 1,000 mg, 1,000 mg, Oral, Q8H, Goldbeck, Lauren D, MD, 1,000 mg at 03/13/22 1651    baclofen (LIORESAL) tablet 5 mg, 5 mg, Oral, TID PRN, Linna Hoff, MD    ciprofloxacin HCl (CIPRO) tablet 500 mg, 500 mg, Oral, Q12H SCH, Durene Romans, MD, 500 mg at 03/13/22 2049    clobetasol (TEMOVATE) 0.05 % ointment, , Topical, BID PRN, Linna Hoff, MD    diclofenac sodium (VOLTAREN) 1 % gel 2 g, 2 g, Topical, QID, Linna Hoff, MD, 2 g at 03/13/22 2047    diphenhydrAMINE (BENADRYL) 25 mg in sodium chloride (NS) 0.9 % 50 mL IVPB, 25 mg, Intravenous, Q6H PRN, Durene Romans, MD    famotidine (PEPCID) tablet 20 mg, 20 mg, Oral, Nightly, Goldbeck, Lauren D, MD, 20 mg at 03/13/22 2050    [START ON 03/14/2022] Parenteral Nutrition (CENTRAL), , Intravenous, Continuous **AND** [START ON 03/14/2022] fat emulsion 20 % with fish oil (  SMOFlipid) infusion 250 mL, 250 mL, Intravenous, Continuous, Jeral Pinch, Elio Forget, MD    gabapentin (NEURONTIN) capsule 200 mg, 200 mg, Oral, Nightly, Linna Hoff, MD, 200 mg at 03/13/22 2048    HYDROmorphone (PF) injection Syrg 0.5 mg, 0.5 mg, Intravenous, Q6H PRN, Goldbeck, Lauren D, MD, 0.5 mg at 03/13/22 2045    hyoscyamine (LEVSIN) tablet 125 mcg, 125 mcg, Oral, Q4H PRN, Darnelle Catalan, Benjamin P, DO    lidocaine 4 % patch 2 patch, 2 patch, Transdermal, Daily, Linna Hoff, MD, 2 patch at 03/13/22 1610    melatonin tablet 3 mg, 3 mg, Oral, Nightly PRN, Linna Hoff, MD    mirtazapine (REMERON) tablet 7.5 mg, 7.5 mg, Oral, Nightly, Alan Ripper, DO, 7.5 mg at 03/13/22 2049    naloxone [facility]) injection 0.4 mg, 0.4 mg, Intravenous, Once PRN, Linna Hoff, MD    OLANZapine (ZyPREXA) tablet 1.25 mg, 1.25 mg, Oral, Daily PRN, Lockie Pares, Rushie Chestnut, MD    OLANZapine zydis (ZyPREXA) disintegrating tablet 7.5 mg, 7.5 mg, Oral, Nightly, Goldbeck, Lauren D, MD, 7.5 mg at 03/13/22 2048    ondansetron (ZOFRAN-ODT) disintegrating tablet 8 mg, 8 mg, Oral, Q12H PRN, Hollie Beach P, DO, 8 mg at 03/12/22 9604    oxyCODONE (ROXICODONE) immediate release tablet 10 mg, 10 mg, Oral, Q4H PRN, Achille Rich, MD, 10 mg at 03/13/22 1651    Parenteral Nutrition (CENTRAL), , Intravenous, Continuous, Stopped at 03/13/22 1225 **AND** [EXPIRED] fat emulsion 20 % with fish oil (SMOFlipid) infusion 250 mL, 250 mL, Intravenous, Continuous, [name], MD, Stopped at 03/13/22 1225    Allergies:  Allergies   Allergen Reactions    Adhesive Tape-Silicones Hives and Rash     Paper tape  And tegederm ok    Ferrlecit [Sodium Ferric Gluconat-Sucrose] Swelling and Rash    Levofloxacin Swelling and Rash     Swelling in mouth, rash,     Methylnaltrexone      Per Patient: I lost bowel control, severe abdominal cramping, and elevated BP    Neomycin Swelling     Rxn after ear drops; ear swelling    Papaya Hives    Morphine Nausea And Vomiting    Zosyn [Piperacillin-Tazobactam] Itching and Rash     Red and itchy    Compazine [Prochlorperazine] Other (See Comments)     Extreme agitation    Latex, Natural Rubber Rash       Social History:   Per previous records (as of 2023):  Socioeconomic History    Marital status: Single   Tobacco Use    Smoking status: Never       Passive exposure: Past    Smokeless tobacco: Never Vaping Use    Vaping Use: Never used   Substance and Sexual Activity    Alcohol use: Never    Drug use: No    Sexual activity: Never   Other Topics Concern    Do you use sunscreen? Yes    Tanning bed use? No    Are you easily burned? No    Excessive sun exposure? No    Blistering sunburns? 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     Family History: Reviewed and updated  The patient's family history includes Alcohol abuse in her paternal grandfather; Arthritis in her maternal grandfather; Asthma in her maternal grandfather; COPD in her paternal grandmother; Cancer  in her maternal grandmother; Diabetes in her maternal grandmother; Hypertension in her maternal grandmother; Miscarriages / Stillbirths in her paternal grandmother; No Known Problems in her brother, father, maternal aunt, maternal uncle, mother, paternal aunt, paternal uncle, and sister; Other in her maternal grandmother; Stroke in her maternal grandmother; Thyroid disease in her maternal grandmother.      Objective:   Vital signs:   Temp:  [36.5 ??C (97.7 ??F)-37.1 ??C (98.8 ??F)] 37.1 ??C (98.8 ??F)  Heart Rate:  [72-91] 91  Resp:  [14-19] 14  BP: (101-108)/(55-73) 106/68  MAP (mmHg):  [69-80] 80  SpO2:  [97 %-99 %] 97 %    Physical Exam:  Gen: No acute distress.  Pulm: Normal work of breathing.  Neuro/MSK: Normal bulk/tone. Gait/station deferred.  Skin: normal skin tone.    Mental Status Exam:  Appearance:  appears stated age and clean/Neat   Attitude:   calm, cooperative, and sad/tearful   Behavior/Psychomotor:  appropriate eye contact and no abnormal movements   Speech/Language:   normal rate, volume, tone, fluency and language intact, well formed   Mood:  ???Not doing very well???   Affect:  constricted, mood congruent, sad, and tearful   Thought process:  logical, linear, clear, coherent, goal directed   Thought content:    denies thoughts of self-harm. Denies SI, plans, or intent. Denies HI.  No grandiose, self-referential, persecutory, or paranoid delusions noted.   Perceptual disturbances:   denies auditory and visual hallucinations and behavior not concerning for response to internal stimuli   Attention:  able to fully attend without fluctuations in consciousness   Concentration:  Able to fully concentrate and attend   Orientation:  grossly oriented.   Memory:  not formally tested, but grossly intact   Fund of knowledge:   not formally assessed   Insight:    Fair   Judgment:   Fair   Impulse Control:  Fair       Data Reviewed:  I reviewed labs from the last 24 hours.     Additional Psychometric Testing:  Not applicable.    Consult Type and Time-Based Documentation:  This patient was evaluated in person.    Time-based billing disclaimer:

## 2022-03-13 NOTE — Unmapped (Signed)
Internal Medicine (MEDW) Progress Note    Assessment & Plan:   [name] [name] is a 22 y.o. 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      Principal Problem:    GI bleeding  Active Problems:    [name]    [name]    Pain    Iron deficiency anemia due to chronic blood loss    Severe protein-calorie malnutrition (CMS-HCC)    Lower abdominal pain    Generalized anxiety disorder with panic attacks  Resolved Problems:    * No resolved hospital problems. *    Active Problems    BRBPR I Acute on Chronic Anemia  Ongoing small-volume BRBPR; known ulcer at ileoanal anastomosis noted on pouchoscopy during last admission. Re-bleed could be a result of sorafenib resumption, of which she has stopped indefinitely. Hgb stable 9.1. GI w/ no indication for repeat endoscopic eval at this time. Follow up on 1/22 w/ Dr. [name].    - continue trending Hgb q12hr  - Maintain active type and screen and adequate IV access with at least 2 large bore IV  - Transfuse Hgb <7    Acute on Chronic Abdominal Pain 2/2 Desmoid Tumors   Ongoing, unclear exact etiology, likely multifactorial in setting of ongoing opiates and previous antibiotics leading to SIBO. Continues tx with ciprofloxacin. Chronic pain following - working on weaning IV Dilaudid today with plans to discontinue tomorrow. Encouraged oral pain regimen.   - Continue cipro 500 mg po bid x14 days for SIBO (EOT 1/16)  - Chronic pain following, appreciate recs  - Oxycodone 10 mg q4hrs PRN moderate pain   - IV Dilaudid 1 mg IV q6hrs PRN breakthrough pain, plan to discontinue 1/5  - Continue Gabapentin 200 mg at bedtime  - Continue Baclofen 5mg  as needed  - Lidocaine patch and voltaren gel      Severe Malnutrition 2/2 [name] and abdominal desmoid tumors   She is currently receiving TPN 5/7 days of the week and is being followed by Dr. [name], outpatient GI  - TPN consult     [name]/Desmoid Tumors   Thoughtful note on 03/11/22 from Dr. [name]. Holding sorafenib for the foreseeable future. Looking into starting nirogacestat as no known bleeding risk. Believes that mesenteric lymph nodes are reactive in the setting of TPN, pouchitis/ulcer, malnutrition, and recurrent GIB. Their fertility counsellor is aware of the potential switch to nirogacestat and will be reaching out to talk with pt and her mother.   - Follow up w/ Dr. [name] 03/18/22  - Hold sorafenib daily given bleeding     Anxiety   Pt in talks with Adolescent Young Adult program at [facility] and is interested in starting SSRI for multi-factorial depression / anxiety. Will consult Psych given multiple medications as below and high bleeding risk.  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    Doing alright this AM. Continues to have a tender abdomen and small BRBPR. Pt and mother interested in speaking with GI and Onc teams again. Discussed weaning IV pain meds .    All other systems were reviewed and are negative except as noted in the HPI    Objective:   Temp: [36.7 ??C (98.1 ??F)-37 ??C (98.6 ??F)] 37 ??C (98.6 ??F)  Heart Rate:  [72-90] 90  Resp:  [14-19] 18  BP: (97-110)/(52-73) 101/55  SpO2:  [97 %-99 %] 97 %    Gen: NAD, reserved    HENT: atraumatic, normocephalic  Heart: RRR  Lungs: CTAB, no crackles or wheezes  Abdomen: soft, slightl distended, TTP overlying LLQ and RLQ  Extremities: No edema    Gus Lockie Pares, MD   [facility] Internal Medicine PGY-1

## 2022-03-13 NOTE — Unmapped (Signed)
Pt is a and o X 4 with stable vs. Able to express needs. Primary complaint is pain, which is not super well controlled with ordered regimen. Encouraged use of oxycodone in conjunction with the IV dilaudid. PO intake poor due to pain in the abdomen. TPN running, lipids turned off at 12:00 per order. Care ongoing.       Problem: Adult Inpatient Plan of Care  Goal: Plan of Care Review  Outcome: Progressing  Goal: Patient-Specific Goal (Individualized)  Outcome: Progressing  Goal: Absence of Hospital-Acquired Illness or Injury  Outcome: Progressing  Intervention: Identify and Manage Fall Risk  Recent Flowsheet Documentation  Taken 03/12/2022 1800 by Heinz Knuckles, RN  Safety Interventions:   aspiration precautions   fall reduction program maintained   lighting adjusted for tasks/safety   low bed   nonskid shoes/slippers when out of bed  Taken 03/12/2022 1600 by Heinz Knuckles, RN  Safety Interventions:   aspiration precautions   fall reduction program maintained   lighting adjusted for tasks/safety   low bed   nonskid shoes/slippers when out of bed  Taken 03/12/2022 1400 by Heinz Knuckles, RN  Safety Interventions:   aspiration precautions   fall reduction program maintained   lighting adjusted for tasks/safety   low bed   nonskid shoes/slippers when out of bed  Taken 03/12/2022 1200 by Heinz Knuckles, RN  Safety Interventions:   aspiration precautions   fall reduction program maintained   lighting adjusted for tasks/safety   low bed   nonskid shoes/slippers when out of bed  Taken 03/12/2022 1000 by Heinz Knuckles, RN  Safety Interventions:   aspiration precautions   fall reduction program maintained   lighting adjusted for tasks/safety   low bed   nonskid shoes/slippers when out of bed  Taken 03/12/2022 0800 by Heinz Knuckles, RN  Safety Interventions:   aspiration precautions   fall reduction program maintained   lighting adjusted for tasks/safety   low bed   nonskid shoes/slippers when out of bed  Intervention: Prevent Skin Injury  Recent Flowsheet Documentation  Taken 03/12/2022 1800 by Heinz Knuckles, RN  Positioning for Skin: Supine/Back  Device Skin Pressure Protection:   absorbent pad utilized/changed   adhesive use limited  Skin Protection: adhesive use limited  Taken 03/12/2022 1600 by Heinz Knuckles, RN  Positioning for Skin: Supine/Back  Device Skin Pressure Protection:   absorbent pad utilized/changed   adhesive use limited  Skin Protection: adhesive use limited  Taken 03/12/2022 1400 by Heinz Knuckles, RN  Positioning for Skin: Supine/Back  Device Skin Pressure Protection:   absorbent pad utilized/changed   adhesive use limited  Skin Protection: adhesive use limited  Taken 03/12/2022 1200 by Heinz Knuckles, RN  Positioning for Skin: Supine/Back  Device Skin Pressure Protection:   absorbent pad utilized/changed   adhesive use limited  Skin Protection: adhesive use limited  Taken 03/12/2022 1000 by Heinz Knuckles, RN  Positioning for Skin: Supine/Back  Device Skin Pressure Protection:   absorbent pad utilized/changed   adhesive use limited  Skin Protection: adhesive use limited  Taken 03/12/2022 0900 by Heinz Knuckles, RN  Device Skin Pressure Protection:   absorbent pad utilized/changed   adhesive use limited  Skin Protection: adhesive use limited  Taken 03/12/2022 0800 by Heinz Knuckles, RN  Positioning for Skin: Supine/Back  Device Skin Pressure Protection:   absorbent pad utilized/changed   adhesive use limited  Skin Protection: adhesive use limited  Intervention: Prevent  and Manage VTE (Venous Thromboembolism) Risk  Recent Flowsheet Documentation  Taken 03/12/2022 1800 by Heinz Knuckles, RN  Anti-Embolism Device Type: SCD, Knee  Anti-Embolism Intervention: Refused  Anti-Embolism Device Location: BLE  Taken 03/12/2022 1600 by Heinz Knuckles, RN  Anti-Embolism Device Type: SCD, Knee  Anti-Embolism Intervention: Refused  Anti-Embolism Device Location: BLE  Taken 03/12/2022 1400 by Heinz Knuckles, RN  Anti-Embolism Device Type: SCD, Knee  Anti-Embolism Intervention: Refused  Anti-Embolism Device Location: BLE  Taken 03/12/2022 1200 by Heinz Knuckles, RN  Anti-Embolism Device Type: SCD, Knee  Anti-Embolism Intervention: Refused  Anti-Embolism Device Location: BLE  Taken 03/12/2022 1000 by Heinz Knuckles, RN  Anti-Embolism Device Type: SCD, Knee  Anti-Embolism Intervention: Refused  Anti-Embolism Device Location: BLE  Taken 03/12/2022 0900 by Heinz Knuckles, RN  VTE Prevention/Management:   anticoagulant therapy   ambulation promoted  Anti-Embolism Device Type: SCD, Knee  Anti-Embolism Intervention: Refused  Anti-Embolism Device Location: BLE  Taken 03/12/2022 0800 by Heinz Knuckles, RN  Anti-Embolism Device Type: SCD, Knee  Anti-Embolism Intervention: Refused  Anti-Embolism Device Location: BLE  Goal: Optimal Comfort and Wellbeing  Outcome: Progressing  Goal: Readiness for Transition of Care  Outcome: Progressing  Goal: Rounds/Family Conference  Outcome: Progressing     Problem: Latex Allergy  Goal: Absence of Allergy Symptoms  Outcome: Progressing     Problem: Gastrointestinal Bleeding  Goal: Optimal Coping with Acute Illness  Outcome: Progressing  Goal: Hemostasis  Outcome: Progressing     Problem: Pain Acute  Goal: Optimal Pain Control and Function  Outcome: Progressing

## 2022-03-13 NOTE — Unmapped (Signed)
Gastroenterology (Luminal) Consult Service   Progress Note         Assessment and Recommendations:   [name] [name] is a 22 y.o. 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   The patient is seen in consultation at the request of [name] Graham, MD ([facility] ([facility])) for hematochezia and abdominal pain     [name] [name] reports onset of BRBPR approximately 1 week ago. Blood was seen both mixed in with stool and in the toilet bowl. She has associated abdominal discomfort and mild distention. She remains hemodynamically stable, with stable Hgb. She and her mom are primarily concerned with symptoms of distention and CT findings showing stable gaseous distention of small bowel loops.   We discussed these findings at length. Today patient and mother express frustration with changes in pain management regimen.      We reviewed the following:  1. BRBRP - given stable Hgb and recent pouch exam with known anastomotic ulcerations, no indication for repeat endoscopic exam at this time. No current symptoms to suggest ongoing fissure as potential contributing factor. Reviewed with oncology potential contribution of sorafenib. Per Oncology, pt to stop sorafenib with consideration of alternative chemotherapy.    -- Please maintain two large-bore (16-18G) peripheral IVs at all times  -- Keep type & screen active  -- Follow the patient's Hgb every 8-12 hours and transfuse for Hgb <7  -- Stop any NSAIDs, aspirin, antiplatelets, and anticoagulants    2. Abdominal distention - with previous surgical history differential includes obstruction vs SIBO. 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 CT scan confirms no obstruction. She has been on chronic cefdinir which increases risk of SIBO. Narcotic bowel syndrome, decreasing motility of gut, is also likely a factor given increasing opioid requirements.   -- continue ciprofloxacin 500mg  PO BID x 14 days  -- defer to pain management regarding current pain regimin, with goal to optimize non-narcotic pain regimen    Issues Impacting Complexity of Management:  -None    Recommendations discussed with the patient's primary team. We will sign-off at this time, please re-contact if additional questions or a new need for consultation arises.    For questions, contact the on-call fellow for the Gastroenterology (Luminal) Consult Service.    Subjective:   No acute events overnight.   She continues to see blood in her BM, but Hgb is stable.     She expresses frustration with persistent abdominal discomfort.     Objective:   Temp:  [36.7 ??C (98.1 ??F)-37 ??C (98.6 ??F)] 37 ??C (98.6 ??F)  Heart Rate:  [82-93] 88  Resp:  [16-18] 18  BP: (102-113)/(58-65) 110/58  SpO2:  [98 %-99 %] 98 %    Gen: chronically ill appearing female patient, tearful on exam, limited response to questions  Abdomen: Soft, mildly distended, tender in RLQ > LLQ, no rebound   Extremities: No edema in the BLEs    Pertinent Labs/Studies:     Hgb 9.1 from 9.0  WBC 5.9   Plt 272

## 2022-03-13 NOTE — Unmapped (Signed)
Internal Medicine (MEDW) Progress Note    Assessment & Plan:   [name] [name] is a 22 y.o. 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      Principal Problem:    GI bleeding  Active Problems:    [name]    [name]    Pain    Iron deficiency anemia due to chronic blood loss    Severe protein-calorie malnutrition (CMS-HCC)    Lower abdominal pain    Generalized anxiety disorder with panic attacks  Resolved Problems:    * No resolved hospital problems. *    Active Problems    Concern for GIB with Hematochezia I Acute on Chronic Anemia  Admitted due to worsening abd pain, distention, and BRBPR for 7 days. During recent prior admission she underwent pouchoscopy with GI which showed bleeding ulcer at the ileoanal anastomosis which was coagulated with APC. Suspect her bleeding driven by sorafenib resumption in setting of known ulcer.  - GI c/s  - continue trending Hgb q12hr  - Maintain active type and screen and adequate IV access with at least 2 large bore IV  - Transfuse Hgb <7    Acute on Chronic Abdominal Pain 2/2 Desmoid Tumors   Presenting with worsening acute on chronic abdominal pain. Having LLQ > RLQ pain (baseline pain typically only in LLQ) Follows with pain mgmt as an outpatient for her chronic abdominal pain likely secondary to her desmoid tumors.   CT abdomen pelvis on admission with stable findings compared to prior CT on 12/10; similar gaseous distention of the small bowel loops adjacent to the ill-defined infiltrative mesenteric lesion in the inferior abdomen, no obstruction, similar appearance of multiple mesenteric lesions. Suspect there may be component of SIBO in setting of chronic cefdinir use.   - stop cefdinir, start cipro 500 mg po bid x14 days for SIBO  - chronic pain following, appreciate recs  - Oxycodone 10 mg q4hrs PRN moderate pain   - IV Dilaudid 1 mg IV q3hrs PRN breakthrough pain   - Continue Gabapentin 200 mg at bedtime  - Continue Baclofen 5mg  as needed  - Lidocaine patch and voltaren gel      Hx of Pouchitis   Patient has been on cefdinir for her pouchitis.  She follows with Dr. [name], GI.  Underwent pouchoscopy on 12/7 with biopsy revealing severely active pouchitis with ulceration  - Continue cipro 500 mg bid      Severe Malnutrition 2/2 [name] and abdominal desmoid tumors   She is currently receiving TPN 5/7 days of the week and is being followed by Dr. [name], outpatient GI  - TPN consult     [name]/Desmoid Tumors   Follows with Dr. [name], last seen 02/28/2022. Appreciate insight and thoughtful care provided on attestation from oncology consult note 1/2.  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    Continues to have ongoing pain and abdominal distention. Discussed having a therapy dog come by on 1/5.    All other systems were reviewed and are negative except as noted in the HPI    Objective:   Temp:  [36.7 ??C (98.1 ??F)-37 ??C (98.6 ??F)] 36.9 ??C (98.4 ??F)  Heart Rate:  [76-91] 76  Resp:  [17-18] 18  BP: (97-113)/(52-65) 97/52  SpO2:  [98 %-99 %] 98 %    Gen: NAD, tearful   HENT: atraumatic, normocephalic  Heart: RRR  Lungs: CTAB, no crackles or wheezes  Abdomen: soft, slightl distended, TTP overlying LLQ and RLQ  Extremities: No edema

## 2022-03-13 NOTE — Unmapped (Cosign Needed)
Department of Anesthesiology  Pain Medicine Division    Chronic Pain Followup Inpatient Consult Note    Requesting Attending Physician:  [name], MD  Service Requesting Consult:  Med General Welt (MDW)    Assessment/Recommendations:  The patient was seen in consultation on request of [name], MD regarding assistance with pain management. The patient is obtaining adequate pain relief on current medication regimen.      The patient is a 22 y.o. 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 Chronic pain team has been consulted to assist with medication management as patient does see outpatient pain provider at [facility] pain clinic.       At this time, patient states that she has been having pain for the past 5 days.  Patient reports this pain is worse than her normal pain.  Patient states at home she was taking oxycodone 10 to 15 mg every 4 hours however this was not touching her pain.  She states at this time she is receiving IV Dilaudid every 3 hours which is helping her along with her heating pad.  We did also discuss starting a Butrans patch on the patient however she stated that she is not ready for this at this time.  She states she would like her pain to be well-controlled first before starting any new medications. Patient and her mother are curious to see if her current distention is related to any infection or mesenteric changes in her abdomen. We discussed that GI would be the best team to ask as they would know more. Once GI does evaluate the patient for any acute GI pathology, we may be able to titrate her medications.      Per last GI note with attestation from attending, they believe patient's recent bleed is from prior diagnosed ulcer and fissure. GI has suggested that patient may have component of SIBO vs. Decreased motility in the setting of opiates and would recommend weaning opioids. Our plan will be to wean off IV opiates at this time.     Interval: Patient states she is doing ok at this time. Patient's mother had questions regarding the weaning plan. We discussed that we will change the dilaudid IV to 6 hours today and then off tomorrow. We also discussed that once we are able to wean down the oxycodone, we can consider initiation of butrans patch inpatient vs outpatient.     Recommendations:  -The chronic pain service is a consult service and does not place orders, just makes recommendations (except ketamine and lidocaine infusions)   -Please evaluate all patients on opioids for appropriateness of prescribing narcan at discharge.  The chronic pain service can assist with this.  Nasal narcan is covered by most insurances.  -Recommendations given apply to the current hospitalization and do not reflect long term recommendations.  - Continue Tylenol 1 g q8h  - Continue Baclofen 5 mg TID PRN  - Wean HM IV 0.5 mg q3h pRN to 0.5 mg q6h PRN, then off tomorrow  - Continue Lidocaine patch  - Encourage use of PO Oxycodone 10 mg q4h PRN     Home MME: 135  Current MME: 130    We will continue to follow.    -Medications trialed during this admission: see below    Naloxone Rx at discharge?  Is patient on opioids? Yes.  1)Is dose >  ?  Yes.  2) Is patient prescribed a benzodiazepine (w opioids)? No.  3)Hx of overdose?  No.  4) Hx of substance use disorder? No.  5) Opioids likely to last greater than a week after discharge? Yes.    If yes to 2 or more, prescribe naloxone at discharge.  Nasal narcan for most insured (Nasal narcan 4mg /actuation, prescribe 1 kit, instructions at [email or web address]).  For uninsured, chronic pain can work to assist in finding an option.  OTC nasal narcan now available at most pharmacies for around $45.    Interim History  There were no Acute Events Overnight.  The patient is obtaining adequate pain relief on current medication regimen and feels that their pain is Controlled    Today patient reports feeling okay. She states she would like to change the interval of her dose to 6 hours however not past that today.     Analgesia Evaluation:  Pain at minimum: 4/10  Pain at maximum: 8/10    Current pain medication regimen (including how frequent PRN's were used):  HM IV 0.5 mg q3h PRN   Oxycodone 10 mg q4h PRN      Inpatient Medications  Current Facility-Administered Medications   Medication Dose Route Frequency Provider Last Rate Last Admin    acetaminophen (TYLENOL) tablet 1,000 mg  1,000 mg Oral Q8H Goldbeck, Lauren D, MD   1,000 mg at 03/13/22 1610    baclofen (LIORESAL) tablet 5 mg  5 mg Oral TID PRN Linna Hoff, MD        ciprofloxacin HCl (CIPRO) tablet 500 mg  500 mg Oral Q12H [facility] Durene Romans, MD   500 mg at 03/13/22 9604    clobetasol (TEMOVATE) 0.05 % ointment   Topical BID PRN Linna Hoff, MD        diclofenac sodium (VOLTAREN) 1 % gel 2 g  2 g Topical QID Linna Hoff, MD   2 g at 03/12/22 2058    diphenhydrAMINE (BENADRYL) 25 mg in sodium chloride (NS) 0.9 % 50 mL IVPB  25 mg Intravenous Q6H PRN Durene Romans, MD        famotidine (PEPCID) tablet 20 mg  20 mg Oral Nightly Alan Ripper, DO   20 mg at 03/12/22 2055    Parenteral Nutrition (CENTRAL)   Intravenous Continuous [name], MD 100 mL/hr at 03/13/22 0022 New Bag at 03/13/22 0022    And    fat emulsion 20 % with fish oil (SMOFlipid) infusion 250 mL  250 mL Intravenous Continuous [name], MD 20.8 mL/hr at 03/13/22 0022 250 mL at 03/13/22 0022    [START ON 03/14/2022] Parenteral Nutrition (CENTRAL)   Intravenous Continuous Achille Rich, MD        And    Melene Muller ON 03/14/2022] fat emulsion 20 % with fish oil (SMOFlipid) infusion 250 mL  250 mL Intravenous Continuous Goldbeck, Lauren D, MD        gabapentin (NEURONTIN) capsule 200 mg  200 mg Oral Nightly Linna Hoff, MD   200 mg at 03/12/22 2055 HYDROmorphone (PF) injection Syrg 0.5 mg  0.5 mg Intravenous Q3H PRN Achille Rich, MD        hyoscyamine (LEVSIN) tablet 125 mcg  125 mcg Oral Q4H PRN Hollie Beach P, DO        lidocaine 4 % patch 2 patch  2 patch Transdermal Daily Linna Hoff, MD   2 patch at 03/13/22 [phone]  melatonin tablet 3 mg  3 mg Oral Nightly PRN Linna Hoff, MD        mirtazapine (REMERON) tablet 7.5 mg  7.5 mg Oral Nightly Alan Ripper, DO   7.5 mg at 03/12/22 2055    naloxone [facility]) injection 0.4 mg  0.4 mg Intravenous Once PRN Linna Hoff, MD        OLANZapine zydis (ZyPREXA) disintegrating tablet 7.5 mg  7.5 mg Oral Nightly Alan Ripper, DO   7.5 mg at 03/12/22 2055    ondansetron (ZOFRAN-ODT) disintegrating tablet 8 mg  8 mg Oral Q12H PRN Hollie Beach P, DO   8 mg at 03/12/22 1660    oxyCODONE (ROXICODONE) immediate release tablet 10 mg  10 mg Oral Q4H PRN Achille Rich, MD             Objective:     Vital Signs    Temp:  [36.7 ??C (98.1 ??F)-37 ??C (98.6 ??F)] 36.9 ??C (98.4 ??F)  Heart Rate:  [72-91] 72  Resp:  [14-18] 14  BP: (97-110)/(52-73) 101/59  MAP (mmHg):  [64-80] 72  SpO2:  [97 %-98 %] 97 %      Physical Exam    GENERAL:  Well developed, well-nourished female and is in no apparent distress.   HEAD/NECK:    Reveals normocephalic/atraumatic.   CARDIOVASCULAR:   Warm, well perfused  LUNGS:   Normal work of breathing, no supplemental 02  EXTREMITIES:  Warm, no clubbing, cyanosis, or edema was noted.  NEUROLOGIC:    The patient was alert and oriented times four with normal language, attention, cognition and memory. Cranial nerve exam was grossly normal.    MUSCULOSKELETAL:    Patient moving all extremities in bed.   ABDOMEN:  Tender, distended.   SKIN:  No obvious rashes lesions or erythema  PSY:  Appropriate affect and mood.    Test Results    Lab Results   Component Value Date    CREATININE 0.48 (L) 03/13/2022     Lab Results   Component Value Date    ALKPHOS 44 (L) 03/12/2022 BILITOT <0.2 (L) 03/12/2022    BILIDIR <0.10 03/12/2022    PROT 6.1 03/12/2022    ALBUMIN 3.1 (L) 03/12/2022    ALT 8 (L) 03/12/2022    AST 14 03/12/2022           Problem List    Principal Problem:    GI bleeding  Active Problems:    [name]    [name]    Pain    Iron deficiency anemia due to chronic blood loss    Severe protein-calorie malnutrition (CMS-HCC)    Lower abdominal pain    Generalized anxiety disorder with panic attacks

## 2022-03-13 NOTE — Unmapped (Signed)
Problem: Adult Inpatient Plan of Care  Goal: Plan of Care Review  Outcome: Progressing  Goal: Patient-Specific Goal (Individualized)  Outcome: Progressing  Goal: Absence of Hospital-Acquired Illness or Injury  Outcome: Progressing  Goal: Optimal Comfort and Wellbeing  Outcome: Progressing  Goal: Readiness for Transition of Care  Outcome: Progressing  Goal: Rounds/Family Conference  Outcome: Progressing     Problem: Latex Allergy  Goal: Absence of Allergy Symptoms  Outcome: Progressing     Problem: Gastrointestinal Bleeding  Goal: Optimal Coping with Acute Illness  Outcome: Progressing  Goal: Hemostasis  Outcome: Progressing     Problem: Pain Acute  Goal: Optimal Pain Control and Function  Outcome: Progressing

## 2022-03-14 LAB — CBC W/ AUTO DIFF
BASOPHILS ABSOLUTE COUNT: 0 10*9/L (ref 0.0–0.1)
BASOPHILS RELATIVE PERCENT: 0.7 %
EOSINOPHILS ABSOLUTE COUNT: 0.2 10*9/L (ref 0.0–0.5)
EOSINOPHILS RELATIVE PERCENT: 2.7 %
HEMATOCRIT: 26.6 % — ABNORMAL LOW (ref 34.0–44.0)
HEMOGLOBIN: 9 g/dL — ABNORMAL LOW (ref 11.3–14.9)
LYMPHOCYTES ABSOLUTE COUNT: 1.9 10*9/L (ref 1.1–3.6)
LYMPHOCYTES RELATIVE PERCENT: 25.7 %
MEAN CORPUSCULAR HEMOGLOBIN CONC: 33.8 g/dL (ref 32.0–36.0)
MEAN CORPUSCULAR HEMOGLOBIN: 27.6 pg (ref 25.9–32.4)
MEAN CORPUSCULAR VOLUME: 81.7 fL (ref 77.6–95.7)
MEAN PLATELET VOLUME: 9.6 fL (ref 6.8–10.7)
MONOCYTES ABSOLUTE COUNT: 0.8 10*9/L (ref 0.3–0.8)
MONOCYTES RELATIVE PERCENT: 10.5 %
NEUTROPHILS ABSOLUTE COUNT: 4.4 10*9/L (ref 1.8–7.8)
NEUTROPHILS RELATIVE PERCENT: 60.4 %
PLATELET COUNT: 253 10*9/L (ref 150–450)
RED BLOOD CELL COUNT: 3.25 10*12/L — ABNORMAL LOW (ref 3.95–5.13)
RED CELL DISTRIBUTION WIDTH: 15.2 % (ref 12.2–15.2)
WBC ADJUSTED: 7.2 10*9/L (ref 3.6–11.2)

## 2022-03-14 LAB — BASIC METABOLIC PANEL
ANION GAP: 3 mmol/L — ABNORMAL LOW (ref 5–14)
BLOOD UREA NITROGEN: 14 mg/dL (ref 9–23)
BUN / CREAT RATIO: 30
CALCIUM: 8.9 mg/dL (ref 8.7–10.4)
CHLORIDE: 106 mmol/L (ref 98–107)
CO2: 29 mmol/L (ref 20.0–31.0)
CREATININE: 0.46 mg/dL — ABNORMAL LOW
EGFR CKD-EPI (2021) FEMALE: >90 mL/min/{1.73_m2} (ref >=60)
GLUCOSE RANDOM: 120 mg/dL (ref 70–179)
POTASSIUM: 3.2 mmol/L — ABNORMAL LOW (ref 3.4–4.8)
SODIUM: 138 mmol/L (ref 135–145)

## 2022-03-14 LAB — PHOSPHORUS: PHOSPHORUS: 3.9 mg/dL (ref 2.4–5.1)

## 2022-03-14 LAB — MAGNESIUM: MAGNESIUM: 2 mg/dL (ref 1.6–2.6)

## 2022-03-14 MED ORDER — NIROGACESTAT 50 MG TABLET
ORAL_TABLET | Freq: Two times a day (BID) | ORAL | 0 refills | 30 days | Status: CP
Start: 2022-03-14 — End: 2022-04-13

## 2022-03-14 MED ADMIN — Parenteral Nutrition (CENTRAL): INTRAVENOUS | @ 05:00:00 | Stop: 2022-03-15

## 2022-03-14 MED ADMIN — oxyCODONE (ROXICODONE) immediate release tablet 10 mg: 10 mg | ORAL | @ 19:00:00 | Stop: 2022-03-25

## 2022-03-14 MED ADMIN — mirtazapine (REMERON) tablet 7.5 mg: 7.5 mg | ORAL | @ 02:00:00

## 2022-03-14 MED ADMIN — famotidine (PEPCID) tablet 20 mg: 20 mg | ORAL | @ 02:00:00

## 2022-03-14 MED ADMIN — acetaminophen (TYLENOL) tablet 1,000 mg: 1000 mg | ORAL | @ 13:00:00

## 2022-03-14 MED ADMIN — fat emulsion 20 % with fish oil (SMOFlipid) infusion 250 mL: 250 mL | INTRAVENOUS | @ 05:00:00 | Stop: 2022-03-14

## 2022-03-14 MED ADMIN — acetaminophen (TYLENOL) tablet 1,000 mg: 1000 mg | ORAL | @ 21:00:00

## 2022-03-14 MED ADMIN — acetaminophen (TYLENOL) tablet 1,000 mg: 1000 mg | ORAL | @ 06:00:00

## 2022-03-14 MED ADMIN — simethicone (MYLICON) chewable tablet 80 mg: 80 mg | ORAL | @ 23:00:00

## 2022-03-14 MED ADMIN — oxyCODONE (ROXICODONE) immediate release tablet 10 mg: 10 mg | ORAL | @ 15:00:00 | Stop: 2022-03-25

## 2022-03-14 MED ADMIN — HYDROmorphone (PF) injection Syrg 0.5 mg: .5 mg | INTRAVENOUS | @ 09:00:00 | Stop: 2022-03-14

## 2022-03-14 MED ADMIN — ciprofloxacin HCl (CIPRO) tablet 500 mg: 500 mg | ORAL | @ 02:00:00 | Stop: 2022-04-08

## 2022-03-14 MED ADMIN — potassium chloride 20 mEq in 100 mL IVPB Premix: 20 meq | INTRAVENOUS | @ 13:00:00 | Stop: 2022-03-14

## 2022-03-14 MED ADMIN — potassium chloride 20 mEq in 100 mL IVPB Premix: 20 meq | INTRAVENOUS | @ 15:00:00 | Stop: 2022-03-14

## 2022-03-14 MED ADMIN — ciprofloxacin HCl (CIPRO) tablet 500 mg: 500 mg | ORAL | @ 13:00:00 | Stop: 2022-04-08

## 2022-03-14 MED ADMIN — HYDROmorphone (PF) injection Syrg 0.5 mg: .5 mg | INTRAVENOUS | @ 02:00:00 | Stop: 2022-03-14

## 2022-03-14 MED ADMIN — oxyCODONE (ROXICODONE) immediate release tablet 10 mg: 10 mg | ORAL | @ 23:00:00 | Stop: 2022-03-25

## 2022-03-14 MED ADMIN — OLANZapine zydis (ZyPREXA) disintegrating tablet 7.5 mg: 7.5 mg | ORAL | @ 02:00:00

## 2022-03-14 MED ADMIN — gabapentin (NEURONTIN) capsule 200 mg: 200 mg | ORAL | @ 02:00:00

## 2022-03-14 MED ADMIN — lidocaine 4 % patch 2 patch: 2 | TRANSDERMAL | @ 13:00:00

## 2022-03-14 MED ADMIN — baclofen (LIORESAL) tablet 5 mg: 5 mg | ORAL | @ 21:00:00

## 2022-03-14 MED ADMIN — oxyCODONE (ROXICODONE) immediate release tablet 10 mg: 10 mg | ORAL | @ 06:00:00 | Stop: 2022-03-25

## 2022-03-14 MED ADMIN — diclofenac sodium (VOLTAREN) 1 % gel 2 g: 2 g | TOPICAL | @ 02:00:00

## 2022-03-14 MED ADMIN — oxyCODONE (ROXICODONE) immediate release tablet 10 mg: 10 mg | ORAL | @ 11:00:00 | Stop: 2022-03-25

## 2022-03-14 MED ADMIN — ondansetron (ZOFRAN) injection 4 mg: 4 mg | INTRAVENOUS | @ 22:00:00 | Stop: 2022-03-14

## 2022-03-14 NOTE — Unmapped (Signed)
Adolescent and Young Adult Cancer Program Visit     Service date: March 14, 2022    Encounter location: Inpatient - Adult    Clinician: [name], LCSW - AYA Clinical Social Worker    Patient identifiers: [name] [name] is a 22 y.o. with desmoid tumors and [name]. [name] [name] uses she/her pronouns.      Visit Summary     Met with the patient for evaluation of needs that may be addressed by [facility].    Provided counseling support and care coordination related to upcoming change in treatment plan, fertility preservation, mental health, and pain management.    With [name] [name]'s permission, spoke with Dr. [name] about her concerns and offered recommendations on adapting her care to meet her needs as a young adult.    Follow Up/Plan:     Will continue to work in collaboration with [name] [name]'s outpatient providers to support her mental and physical health.    I will continue to follow this patient for AYA-appropriate support. They have my contact information and have been encouraged to contact me as needed.     Flowsheet     AYA Assessment  Primary Caregiver: Parent  Location Seen: Inpatient  Contact Point: During treatment  Referral Source: AYA team  Issues Discussed: Treatment information, Symptom management, Medication questions, Fertility preservation, Mental Health, Sleep, Healthcare system barriers, Concerns with care or care team  AYA Team Interventions: Preliminary fertility preservation education / coordination, Psychoeducation, Goals of care support and communication, Supportive counseling, Coordination with primary oncology team, Adapting care to AYA needs  Referrals Made: Palliative Care  Follow Up: 1 week  Time Spent (in minutes): 45    03/14/2022     [name], LCSW  Adolescent and Young Adult Clinical Social Worker  Phone: [phone]  Email: [email or web address]

## 2022-03-14 NOTE — Unmapped (Signed)
Problem: Adult Inpatient Plan of Care  Goal: Plan of Care Review  Outcome: Progressing  Goal: Patient-Specific Goal (Individualized)  Outcome: Progressing  Goal: Absence of Hospital-Acquired Illness or Injury  Outcome: Progressing  Goal: Optimal Comfort and Wellbeing  Outcome: Progressing  Goal: Readiness for Transition of Care  Outcome: Progressing  Goal: Rounds/Family Conference  Outcome: Progressing     Problem: Latex Allergy  Goal: Absence of Allergy Symptoms  Outcome: Progressing     Problem: Gastrointestinal Bleeding  Goal: Optimal Coping with Acute Illness  Outcome: Progressing  Goal: Hemostasis  Outcome: Progressing     Problem: Pain Acute  Goal: Optimal Pain Control and Function  Outcome: Progressing

## 2022-03-14 NOTE — Unmapped (Signed)
Department of Anesthesiology  Pain Medicine Division    Chronic Pain Followup Inpatient Consult Note    Requesting Attending Physician:  [name], MD  Service Requesting Consult:  Med General Welt (MDW)    Assessment/Recommendations:  The patient was seen in consultation on request of [name], MD regarding assistance with pain management. The patient is obtaining adequate pain relief on current medication regimen.      The patient is a 22 y.o. 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 Chronic pain team has been consulted to assist with medication management as patient does see outpatient pain provider at [facility] pain clinic.       At this time, patient states that she has been having pain for the past 5 days.  Patient reports this pain is worse than her normal pain.  Patient states at home she was taking oxycodone 10 to 15 mg every 4 hours however this was not touching her pain.  She states at this time she is receiving IV Dilaudid every 3 hours which is helping her along with her heating pad.  We did also discuss starting a Butrans patch on the patient however she stated that she is not ready for this at this time.  She states she would like her pain to be well-controlled first before starting any new medications. Patient and her mother are curious to see if her current distention is related to any infection or mesenteric changes in her abdomen. We discussed that GI would be the best team to ask as they would know more.      Per last GI note with attestation from attending, they believe patient's recent bleed is from prior diagnosed ulcer and fissure. GI has suggested that patient may have component of SIBO vs. Decreased motility in the setting of opiates and would recommend weaning opioids. Our plan will be to wean off IV opiates at this time.     Interval: We discussed with the patient that today we will have only PO medications available for request. We again recommended using baclofen as we believe that may help with her abdominal pain. We also discussed with the patient and her mother than if/ when patient's oxycodone requirements are lower, we can consider butrans patch however this does not need to be performed in the hospital.     Recommendations:  -The chronic pain service is a consult service and does not place orders, just makes recommendations (except ketamine and lidocaine infusions)   -Please evaluate all patients on opioids for appropriateness of prescribing narcan at discharge.  The chronic pain service can assist with this.  Nasal narcan is covered by most insurances.  -Recommendations given apply to the current hospitalization and do not reflect long term recommendations.  - Continue Tylenol 1 g q8h  - Encourage use of Baclofen 5 mg TID PRN  - Discontinue IV dilaudid PRN  - Continue Lidocaine patch  - Encourage use of PO Oxycodone 10 mg q4h PRN   - As patient clinically improves inpatient vs outpatient, we would consider starting a butrans patch. Patient would ideally be under oxycodone 30 mg in a day however this is not a barrier to discharge and can be performed as outpatient.    Home MME: 135  Current MME: 130    We will sign off at this time. Thank you for this  consult!     -Medications trialed during this admission: see below    Naloxone Rx at discharge?  Is patient on opioids? Yes.  1)Is dose >50MME?  Yes.  2) Is patient prescribed a benzodiazepine (w opioids)? No.  3)Hx of overdose?  No.  4) Hx of substance use disorder? No.  5) Opioids likely to last greater than a week after discharge? Yes.    If yes to 2 or more, prescribe naloxone at discharge.  Nasal narcan for most insured (Nasal narcan 4mg /actuation, prescribe 1 kit, instructions at [email or web address]).  For uninsured, chronic pain can work to assist in finding an option.  OTC nasal narcan now available at most pharmacies for around $45.    Interim History  There were no Acute Events Overnight.  The patient is obtaining adequate pain relief on current medication regimen and feels that their pain is Controlled    Today patient states she is feeling okay.     Analgesia Evaluation:  Pain at minimum: 8/10  Pain at maximum: 9/10    Current pain medication regimen (including how frequent PRN's were used):  HM IV 0.5 mg q6h PRN   Oxycodone 10 mg q4h PRN      Inpatient Medications  Current Facility-Administered Medications   Medication Dose Route Frequency Provider Last Rate Last Admin    acetaminophen (TYLENOL) tablet 1,000 mg  1,000 mg Oral Q8H Goldbeck, Lauren D, MD   1,000 mg at 03/14/22 0817    baclofen (LIORESAL) tablet 5 mg  5 mg Oral TID PRN Linna Hoff, MD        ciprofloxacin HCl (CIPRO) tablet 500 mg  500 mg Oral Q12H [facility] Durene Romans, MD   500 mg at 03/14/22 0817    clobetasol (TEMOVATE) 0.05 % ointment   Topical BID PRN Linna Hoff, MD        diclofenac sodium (VOLTAREN) 1 % gel 2 g  2 g Topical QID Linna Hoff, MD   2 g at 03/13/22 2047    diphenhydrAMINE (BENADRYL) 25 mg in sodium chloride (NS) 0.9 % 50 mL IVPB  25 mg Intravenous Q6H PRN Durene Romans, MD        famotidine (PEPCID) tablet 20 mg  20 mg Oral Nightly Achille Rich, MD   20 mg at 03/13/22 2050    Parenteral Nutrition (CENTRAL)   Intravenous Continuous [name], MD 100 mL/hr at 03/14/22 0018 New Bag at 03/14/22 0018    And    fat emulsion 20 % with fish oil (SMOFlipid) infusion 250 mL  250 mL Intravenous Continuous [name], MD 20.8 mL/hr at 03/14/22 0029 250 mL at 03/14/22 0029    [START ON 03/15/2022] Parenteral Nutrition (CENTRAL)   Intravenous Continuous [name], MD        And    [START ON 03/15/2022] fat emulsion 20 % with fish oil (SMOFlipid) infusion 250 mL  250 mL Intravenous Continuous [name], MD        gabapentin (NEURONTIN) capsule 200 mg  200 mg Oral Nightly Linna Hoff, MD   200 mg at 03/13/22 2048    hyoscyamine (LEVSIN) tablet 125 mcg  125 mcg Oral Q4H PRN Hollie Beach P, DO        lidocaine 4 % patch 2 patch  2 patch Transdermal Daily Linna Hoff, MD   2 patch at 03/14/22 0818    melatonin tablet 3 mg  3 mg Oral Nightly  PRN Linna Hoff, MD        mirtazapine (REMERON) tablet 7.5 mg  7.5 mg Oral Nightly Alan Ripper, DO   7.5 mg at 03/13/22 2049    naloxone [facility]) injection 0.4 mg  0.4 mg Intravenous Once PRN Linna Hoff, MD        OLANZapine (ZyPREXA) tablet 1.25 mg  1.25 mg Oral Daily PRN Cleotis Nipper, MD        OLANZapine zydis (ZyPREXA) disintegrating tablet 7.5 mg  7.5 mg Oral Nightly Goldbeck, Lauren D, MD   7.5 mg at 03/13/22 2048    ondansetron (ZOFRAN-ODT) disintegrating tablet 8 mg  8 mg Oral Q12H PRN Hollie Beach P, DO   8 mg at 03/12/22 1610    oxyCODONE (ROXICODONE) immediate release tablet 10 mg  10 mg Oral Q4H PRN Achille Rich, MD   10 mg at 03/14/22 9604    potassium chloride 20 mEq in 100 mL IVPB Premix  20 mEq Intravenous Once Cleotis Nipper, MD        Followed by    potassium chloride 20 mEq in 100 mL IVPB Premix  20 mEq Intravenous Once Cleotis Nipper, MD        simethicone (MYLICON) chewable tablet 80 mg  80 mg Oral Q6H PRN Goldbeck, Eliott Nine, MD             Objective:     Vital Signs    Temp:  [36.5 ??C (97.7 ??F)-37.1 ??C (98.8 ??F)] 36.6 ??C (97.9 ??F)  Heart Rate:  [76-91] 76  Resp:  [14-19] 19  BP: (96-109)/(50-68) 96/50  MAP (mmHg):  [64-80] 64  SpO2:  [97 %-99 %] 99 %      Physical Exam    GENERAL:  Well developed, well-nourished female and is in no apparent distress.   HEAD/NECK:    Reveals normocephalic/atraumatic.   CARDIOVASCULAR:   Warm, well perfused  LUNGS:   Normal work of breathing, no supplemental 02  EXTREMITIES:  Warm, no clubbing, cyanosis, or edema was noted.  NEUROLOGIC:    The patient was alert and oriented times four with normal language, attention, cognition and memory. Cranial nerve exam was grossly normal.    MUSCULOSKELETAL:    Patient moving all extremities in bed.   ABDOMEN:  Tender, distended.   SKIN:  No obvious rashes lesions or erythema  PSY:  Appropriate affect and mood.    Test Results    Lab Results   Component Value Date    CREATININE 0.46 (L) 03/14/2022     Lab Results   Component Value Date    ALKPHOS 44 (L) 03/12/2022    BILITOT <0.2 (L) 03/12/2022    BILIDIR <0.10 03/12/2022    PROT 6.1 03/12/2022    ALBUMIN 3.1 (L) 03/12/2022    ALT 8 (L) 03/12/2022    AST 14 03/12/2022           Problem List    Principal Problem:    GI bleeding  Active Problems:    [name]    [name]    Pain    Iron deficiency anemia due to chronic blood loss    Severe protein-calorie malnutrition (CMS-HCC)    Lower abdominal pain    Generalized anxiety disorder with panic attacks    MDD (major depressive disorder)

## 2022-03-14 NOTE — Unmapped (Signed)
Internal Medicine (MEDW) Progress Note    Assessment & Plan:   [name] [name] is a 22 y.o. 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      Principal Problem:    GI bleeding  Active Problems:    [name]    [name]    Pain    Iron deficiency anemia due to chronic blood loss    Severe protein-calorie malnutrition (CMS-HCC)    Lower abdominal pain    Generalized anxiety disorder with panic attacks    MDD (major depressive disorder)  Resolved Problems:    * No resolved hospital problems. *    Active Problems    BRBPR I Acute on Chronic Anemia  Ongoing small-volume BRBPR; known ulcer at ileoanal anastomosis noted on pouchoscopy during last admission. Re-bleed could be a result of sorafenib resumption, of which she has stopped indefinitely. Hgb stable 9.0. GI w/ no indication for repeat endoscopic eval at this time. Follow up on 1/22 w/ Dr. [name].   - continue trending Hgb q12hr  - Maintain active type and screen and adequate IV access with at least 2 large bore IV  - Transfuse Hgb <7    Acute on Chronic Abdominal Pain 2/2 Desmoid Tumors - SIBO  Ongoing, poor control. Unclear exact etiology, likely multifactorial in setting of ongoing opiates, previous antibiotics leading to SIBO, and underlying desmoid tumors. Continues tx with ciprofloxacin. Pain has been particularly worse today now off of IV Dilaudid. Encouraging adjunctive strategies to attempt multi-modal pain control as below. Will check a KUB to ensure no obstruction as she had an episode of emesis today and has not moved her bowels since the morning on 1/4. Abdomen soft with bowel sounds present. Will prescribe IV Zofran for the nausea.  Per the chronic pain service, patient would ideally be under oxycodone 30 mg daily prior to starting a Butrans patch, but this would not be a barrier to discharge.  Will require a Narcan prescription on discharge.  - Continue cipro 500 mg po bid x14 days for SIBO (EOT 1/16)  - Oxycodone 10 mg q4hrs PRN moderate pain   - Continue Gabapentin 200 mg at bedtime  - Continue Baclofen 5mg  as needed  - Lidocaine patch and voltaren gel   - Scheduled Tylenol   - Simethicone  - KUB  - EKG for QT check     Severe Malnutrition 2/2 [name] and abdominal desmoid tumors   She is currently receiving TPN 5/7 days of the week and is being followed by Dr. [name], outpatient GI  - TPN consult     [name]/Desmoid Tumors   Thoughtful note on 03/11/22 from Dr. [name]. Holding sorafenib for the foreseeable future. Looking into starting nirogacestat as no known bleeding risk. Believes that mesenteric lymph nodes are reactive in the setting of TPN, pouchitis/ulcer, malnutrition, and recurrent GIB. Their fertility counsellor is aware of the potential switch to nirogacestat and will be reaching out to talk with pt and her mother.   - Follow up w/ Dr. [name] 03/18/22  - Hold sorafenib daily given bleeding     Anxiety   Pt plugged in with Adolescent Young Adult program at [facility]. Anxiety and Depression regimen as below.  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    Pain not well-controlled today.  Episode of emesis X1 around lunchtime.  No bowel movement since yesterday morning.  Little appetite.  Denies chest pain or shortness of breath.  Ongoing small-volume bright red blood noticed in the toilet bowl today.  Denies lightheadedness or dizziness.    All other systems were reviewed and are negative except as noted in the HPI    Objective:   Temp:  [36.3 ??C (97.3 ??F)-37.1 ??C (98.8 ??F)] 36.3 ??C (97.3 ??F)  Heart Rate:  [76-97] 97  Resp:  [14-19] 18  BP: (96-111)/(50-68) 111/64  SpO2:  [97 %-99 %] 98 %    Gen: NAD, reserved, tearful  HENT: atraumatic, normocephalic  Heart: RRR  Lungs: CTAB, no crackles or wheezes  Abdomen: soft, slightly distended, tympanitic, TTP overlying LLQ and RLQ  Extremities: No edema    Gus Lockie Pares, MD   [facility] Internal Medicine PGY-1

## 2022-03-15 LAB — CBC W/ AUTO DIFF
BASOPHILS ABSOLUTE COUNT: 0 10*9/L (ref 0.0–0.1)
BASOPHILS RELATIVE PERCENT: 0.5 %
EOSINOPHILS ABSOLUTE COUNT: 0.2 10*9/L (ref 0.0–0.5)
EOSINOPHILS RELATIVE PERCENT: 2.2 %
HEMATOCRIT: 26.4 % — ABNORMAL LOW (ref 34.0–44.0)
HEMOGLOBIN: 8.6 g/dL — ABNORMAL LOW (ref 11.3–14.9)
LYMPHOCYTES ABSOLUTE COUNT: 1.9 10*9/L (ref 1.1–3.6)
LYMPHOCYTES RELATIVE PERCENT: 23.3 %
MEAN CORPUSCULAR HEMOGLOBIN CONC: 32.8 g/dL (ref 32.0–36.0)
MEAN CORPUSCULAR HEMOGLOBIN: 27.1 pg (ref 25.9–32.4)
MEAN CORPUSCULAR VOLUME: 82.6 fL (ref 77.6–95.7)
MEAN PLATELET VOLUME: 9.6 fL (ref 6.8–10.7)
MONOCYTES ABSOLUTE COUNT: 0.8 10*9/L (ref 0.3–0.8)
MONOCYTES RELATIVE PERCENT: 9.6 %
NEUTROPHILS ABSOLUTE COUNT: 5.3 10*9/L (ref 1.8–7.8)
NEUTROPHILS RELATIVE PERCENT: 64.4 %
PLATELET COUNT: 260 10*9/L (ref 150–450)
RED BLOOD CELL COUNT: 3.19 10*12/L — ABNORMAL LOW (ref 3.95–5.13)
RED CELL DISTRIBUTION WIDTH: 14.9 % (ref 12.2–15.2)
WBC ADJUSTED: 8.2 10*9/L (ref 3.6–11.2)

## 2022-03-15 LAB — BASIC METABOLIC PANEL
ANION GAP: 4 mmol/L — ABNORMAL LOW (ref 5–14)
BLOOD UREA NITROGEN: 13 mg/dL (ref 9–23)
BUN / CREAT RATIO: 29
CALCIUM: 8.9 mg/dL (ref 8.7–10.4)
CHLORIDE: 108 mmol/L — ABNORMAL HIGH (ref 98–107)
CO2: 28 mmol/L (ref 20.0–31.0)
CREATININE: 0.45 mg/dL — ABNORMAL LOW
EGFR CKD-EPI (2021) FEMALE: >90 mL/min/{1.73_m2} (ref >=60)
GLUCOSE RANDOM: 113 mg/dL (ref 70–179)
POTASSIUM: 4.1 mmol/L (ref 3.4–4.8)
SODIUM: 140 mmol/L (ref 135–145)

## 2022-03-15 LAB — HEMOGLOBIN AND HEMATOCRIT, BLOOD
HEMATOCRIT: 27.2 % — ABNORMAL LOW (ref 34.0–44.0)
HEMOGLOBIN: 9 g/dL — ABNORMAL LOW (ref 11.3–14.9)

## 2022-03-15 LAB — PHOSPHORUS: PHOSPHORUS: 3.6 mg/dL (ref 2.4–5.1)

## 2022-03-15 LAB — MAGNESIUM: MAGNESIUM: 2.2 mg/dL (ref 1.6–2.6)

## 2022-03-15 MED ADMIN — famotidine (PEPCID) tablet 20 mg: 20 mg | ORAL | @ 04:00:00

## 2022-03-15 MED ADMIN — oxyCODONE (ROXICODONE) immediate release tablet 10 mg: 10 mg | ORAL | @ 16:00:00 | Stop: 2022-03-25

## 2022-03-15 MED ADMIN — ciprofloxacin HCl (CIPRO) tablet 500 mg: 500 mg | ORAL | @ 04:00:00 | Stop: 2022-04-08

## 2022-03-15 MED ADMIN — oxyCODONE (ROXICODONE) immediate release tablet 10 mg: 10 mg | ORAL | @ 04:00:00 | Stop: 2022-03-25

## 2022-03-15 MED ADMIN — oxyCODONE (ROXICODONE) immediate release tablet 10 mg: 10 mg | ORAL | @ 21:00:00 | Stop: 2022-03-25

## 2022-03-15 MED ADMIN — mirtazapine (REMERON) tablet 7.5 mg: 7.5 mg | ORAL | @ 04:00:00

## 2022-03-15 MED ADMIN — acetaminophen (TYLENOL) tablet 1,000 mg: 1000 mg | ORAL | @ 05:00:00

## 2022-03-15 MED ADMIN — acetaminophen (TYLENOL) tablet 1,000 mg: 1000 mg | ORAL | @ 16:00:00

## 2022-03-15 MED ADMIN — HYDROmorphone (PF) injection Syrg 0.5 mg: .5 mg | INTRAVENOUS | @ 19:00:00 | Stop: 2022-03-29

## 2022-03-15 MED ADMIN — oxyCODONE (ROXICODONE) immediate release tablet 10 mg: 10 mg | ORAL | @ 10:00:00 | Stop: 2022-03-25

## 2022-03-15 MED ADMIN — gabapentin (NEURONTIN) capsule 200 mg: 200 mg | ORAL | @ 04:00:00

## 2022-03-15 MED ADMIN — ondansetron (ZOFRAN-ODT) disintegrating tablet 4 mg: 4 mg | ORAL | @ 04:00:00 | Stop: 2022-03-14

## 2022-03-15 MED ADMIN — baclofen (LIORESAL) tablet 5 mg: 5 mg | ORAL | @ 22:00:00

## 2022-03-15 MED ADMIN — acetaminophen (TYLENOL) tablet 1,000 mg: 1000 mg | ORAL | @ 21:00:00

## 2022-03-15 MED ADMIN — iohexol (OMNIPAQUE) 240 mg iodine/mL oral solution 50 mL: 50 mL | ORAL | @ 02:00:00 | Stop: 2022-03-14

## 2022-03-15 MED ADMIN — Parenteral Nutrition (CENTRAL): INTRAVENOUS | @ 05:00:00 | Stop: 2022-03-16

## 2022-03-15 MED ADMIN — fat emulsion 20 % with fish oil (SMOFlipid) infusion 250 mL: 250 mL | INTRAVENOUS | @ 05:00:00 | Stop: 2022-03-15

## 2022-03-15 MED ADMIN — ciprofloxacin HCl (CIPRO) tablet 500 mg: 500 mg | ORAL | @ 16:00:00 | Stop: 2022-04-08

## 2022-03-15 MED ADMIN — iohexol (OMNIPAQUE) 350 mg iodine/mL solution 100 mL: 100 mL | INTRAVENOUS | @ 04:00:00 | Stop: 2022-03-14

## 2022-03-15 MED ADMIN — OLANZapine zydis (ZyPREXA) disintegrating tablet 7.5 mg: 7.5 mg | ORAL | @ 04:00:00

## 2022-03-15 MED ADMIN — lidocaine 4 % patch 2 patch: 2 | TRANSDERMAL | @ 16:00:00

## 2022-03-15 NOTE — Unmapped (Signed)
Internal Medicine (MEDW) Progress Note    Assessment & Plan:   [name] [name] is a 22 y.o. female whose presentation is complicated by [name] [name] Syndrome ([name]) s/p proctocolectomy w/ileoanal anastomosis, desmoid tumors (previously on sorafenib), anemia, on TPN, anxiety/nausea, recent admission for LGIB (anastomotic ulcer, anal fissures), presenting with BRBPR and recurrent abdominal pain.      Principal Problem:    GI bleeding  Active Problems:    [name]    [name]    Pain    Iron deficiency anemia due to chronic blood loss    Severe protein-calorie malnutrition (CMS-HCC)    Lower abdominal pain    Generalized anxiety disorder with panic attacks    MDD (major depressive disorder)    Small intestinal bacterial overgrowth (SIBO)  Resolved Problems:    * No resolved hospital problems. *    Active Problems    C/f Ileus vs Narcotic Bowel - Acute on Chronic Abdominal Pain - Desmoid Tumors   Acutely worsened abdominal pain with N/V 1/5, CT suggestive of ileus. Discussed with pt and mother that opioids (especially IV opioids) can make this worse, but pt unable to move around today without IV dilaudid. Did let pain mgmt know about this, not in house over the weekend, and GI will see tomorrow - all in agreement that opioids are unlikely to help actual ileus but currently in a tough spot with her severe pain. Unable to trial NSAIDs which have worked in the past due to ongoing BRBPR. Will order prn iv dilaudid and hopefully with NGT decompression and bowel rest, pain will improve so that we can de-escalate.   - PRN iv dilaudid q6h for now, attempt to wean off  - NGT for decompression  - NPO (sips/meds are OK)  - Continue treatment for presumed SIBO with cipro 500mg  BID x14 days (EOT 1/16)   - Oxy 10mg  q4h PRN  - Gabapentin 200mg  at bedtime  - Baclofen 5mg  TID PRN   - Lidocaine patch, voltaren gel  - Consider adding on naloxegel (once done with treating ileus) if she remains on opioids   - GI will see tomorrow     BRBPR I Acute on Chronic Anemia  Ongoing small-volume BRBPR; known ulcer at ileoanal anastomosis noted on pouchoscopy during last admission. Re-bleed could be a result of sorafenib resumption, of which she has stopped indefinitely.  GI w/ no indication for repeat endoscopic eval at this time. Follow up on 1/22 w/ Dr. [name]. Hgb downtrending, no hemodynamically sig bleeding at this time but monitoring at least daily.    - continue trending Hgb daily   - Maintain active type and screen and adequate IV access with at least 2 large bore IV  - Transfuse Hgb <7    Severe Malnutrition 2/2 [name] and abdominal desmoid tumors   She is currently receiving TPN 5/7 days of the week and is being followed by Dr. [name], outpatient GI  - TPN consult     [name]/Desmoid Tumors   Thoughtful note on 03/11/22 from Dr. [name]. Holding sorafenib for the foreseeable future. Looking into starting nirogacestat as no known bleeding risk. Believes that mesenteric lymph nodes are reactive in the setting of TPN, pouchitis/ulcer, malnutrition, and recurrent GIB. Their fertility counsellor is aware of the potential switch to nirogacestat and will be reaching out to talk with pt and her mother.   - Follow up w/ Dr. [name] 03/18/22  - Hold sorafenib daily given bleeding     Anxiety  Pt in talks with Adolescent Young Adult program at [facility] and is interested in starting SSRI for multi-factorial depression / anxiety. Will consult Psych given multiple medications as below and high bleeding risk.  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 CT suggestive of ileus. Did have diarrhea after oral contrast, no BM this morning. Did not have a very large amount of bright red blood in diarrhea but there was some blood.     Objective:   Temp:  [36.3 ??C (97.3 ??F)-37 ??C (98.6 ??F)] 36.9 ??C (98.4 ??F)  Heart Rate:  [76-97] 93  Resp:  [14-19] 14  BP: (96-113)/(50-69) 113/69  SpO2:  [97 %-99 %] 98 %    Gen: NAD, sitting upright in bed   HENT: atraumatic, normocephalic  Heart: RRR  Lungs: CTAB anteriorly, no crackles or wheezes. No increased WOB on RA   Abdomen: soft, distended. Active bowel sounds. Diffusely TTP

## 2022-03-15 NOTE — Unmapped (Addendum)
Care Management  Initial Transition Planning Assessment              General  Care Manager assessed the patient by : In person interview with patient, In person interview with family  Orientation Level: Oriented X4  Functional level prior to admission: Independent  Reason for referral: Discharge Planning      Pt was admitted for pain control and daily TPN.   HI/HH   [facility]  [phone]   [facility] [phone]   [facility] infusion therapy [phone]      Contact/Decision Maker  Extended Emergency Contact Information  Primary Emergency Contact: Jule Ser [place]  Home Phone: [phone]  Work Phone: [phone]  Mobile Phone: [phone]  Relation: Mother  Secondary Emergency Contact: Lahmann,Jeff  Address: [address]           [place], [place] [address] [place]  Mobile Phone: [phone]  Relation: Father    Legal Next of Kin / Guardian / POA / Advance Directives     HCDM ([place] policy, no known patient preference): [place],[name] - Mother - [phone]    HCDM, back-up (If primary HCDM is unavailable): [name] - Father - [phone]    Advance Directive (Medical Treatment)  Does patient have an advance directive covering medical treatment?: Patient does not have advance directive covering medical treatment.  Reason patient does not have an advance directive covering medical treatment:: Patient does not wish to complete one at this time.    Health Care Decision Maker [HCDM] (Medical & Mental Health Treatment)  Healthcare Decision Maker: HCDM documented in the HCDM/Contact Info section.  Information offered on HCDM, Medical & Mental Health advance directives:: Patient given information.    Advance Directive (Mental Health Treatment)  Does patient have an advance directive covering mental health treatment?: Patient does not have advance directive covering mental health treatment.  Reason patient does not have an advance directive covering mental health treatment:: Patient does not wish to complete one at this time.    Readmission Information     Did the following happen with your discharge?    Patient Information  Lives with: Family members, Parent    Type of Residence: Private residence      Type of Residence: Mailing Address:  [address] [place] [address]  Contacts: Accompanied by: Family member  Password: declines  Patient Phone Number: [phone] (home) [phone] (work)          Medical Provider(s): Jarold Motto, [facility]  Reason for Admission: Admitting Diagnosis:  Melena [K92.1]  [name] (familial adenomatous polyposis) [D13.91]  Immunosuppression (CMS-HCC) [D84.9]  Past Medical History:   has a past medical history of Abdominal pain, Acid reflux, Anesthesia complication, Cancer (CMS-HCC), Cataract of right eye, COVID-19 virus infection (01/2019), Cyst of thyroid determined by ultrasound, [name], Difficult intravenous access, [name] (familial adenomatous polyposis), [name], Gastric polyps, History of chemotherapy, History of colon polyps, History of COVID-19 (01/2019), Iron deficiency anemia due to chronic blood loss, PONV (postoperative nausea and vomiting), Rectal bleeding, and Syncopal episodes.  Past Surgical History:   has a past surgical history that includes desmoid removal; cystis removal; Tumor removal; pr upper gi endoscopy,biopsy (N/A, 10/27/2012); pr colonoscopy w/biopsy single/multiple (N/A, 10/27/2012); pr upper gi endoscopy,biopsy (N/A, 09/14/2013); pr colonoscopy w/biopsy single/multiple (N/A, 09/14/2013); pr upper gi endoscopy,biopsy (N/A, 11/08/2014); pr colonoscopy w/biopsy single/multiple (N/A, 11/08/2014); pr upper gi endoscopy,biopsy (N/A, 12/26/2015); pr colonoscopy w/biopsy single/multiple (N/A, 12/26/2015); pr upper gi endoscopy,biopsy (N/A,  08/27/2016); pr colsc flx w/removal lesion by hot bx forceps (N/A, 08/27/2016); pr exc skin benig >4 cm face,facial (Right, 02/25/2017); pr exc skin benig 3.1-4 cm trunk,arm,leg (Right, 02/25/2017); pr exc skin benig 2.1-3 cm trunk,arm,leg (Right, 02/25/2017); pr upper gi endoscopy,biopsy (N/A, 09/02/2017); pr colonoscopy w/biopsy single/multiple (N/A, 09/02/2017); pr colsc flx w/rmvl of tumor polyp lesion snare tq (N/A, 02/25/2019); pr exc tumor soft tissue leg/ankle subq 3+cm (Right, 08/05/2019); pr exc tumor soft tissue leg/ankle subq <3cm (Right, 08/05/2019); pr colsc flx w/rmvl of tumor polyp lesion snare tq (N/A, 03/13/2020); pr upper gi endoscopy,biopsy (N/A, 03/13/2020); cyroablation; pr lap, surg proctectomy w j-pouch (N/A, 08/10/2020); pr close enterostomy,resec+anast (N/A, 10/09/2020); pr ndsc eval intstinal pouch dx w/collj spec spx (N/A, 01/23/2021); pr unlisted procedure small intestine (01/23/2021); pr ndsc eval intstinal pouch dx w/collj spec spx (N/A, 08/27/2021); pr upper gi endoscopy,biopsy (N/A, 09/05/2021); pr ndsc eval intstinal pouch dx w/collj spec spx (N/A, 12/09/2021); pr upper gi endoscopy,diagnosis (N/A, 01/20/2022); pr ndsc eval intstinal pouch w/bx single/multiple (N/A, 01/20/2022); pr ndsc eval intstinal pouch w/bx single/multiple (N/A, 02/13/2022); and pr unlisted procedure small intestine (02/13/2022).   Previous admit date: 02/13/2022    Primary Insurance- Payor: BCBS / Plan: BCBS BLUE OPTIONS/PPO/ADV ([place] ONLY) / Product Type: *No Product type* /   Secondary Insurance - None  Prescription Coverage - on file  [facility] - [phone] [place] [place] - [address] AT [address] OF [facility] [address]  [facility]  [facility]  [phone] [place], [place] - [address]  [place] [facility]    Transportation home: Private vehicle    Support Systems/Concerns: Case Research officer, political party, Family Members, Friends/Neighbors, Parent         Home Care services in place prior to admission?: Yes  Type of Home Care services in place prior to admission: Home infusion  Current Home Care provider (Name/Phone #): Geneticist, molecular Currently Used at Home: feeding device       Currently receiving outpatient dialysis?: No       Financial Information       Need for financial assistance?: No       Social Determinants of Health  Social Determinants of Health     Financial Resource Strain: Low Risk  (03/14/2022)    Overall Financial Resource Strain (CARDIA)     Difficulty of Paying Living Expenses: Not very hard   Internet Connectivity: Not on file   Food Insecurity: No Food Insecurity (03/14/2022)    Hunger Vital Sign     Worried About Running Out of Food in the Last Year: Never true     Ran Out of Food in the Last Year: Never true   Tobacco Use: Low Risk  (03/09/2022)    Patient History     Smoking Tobacco Use: Never     Smokeless Tobacco Use: Never     Passive Exposure: Past   Housing/Utilities: Low Risk  (03/14/2022)    Housing/Utilities     Within the past 12 months, have you ever stayed: outside, in a car, in a tent, in an overnight shelter, or temporarily in someone else's home (i.e. couch-surfing)?: No     Are you worried about losing your housing?: No     Within the past 12 months, have you been unable to get utilities (heat, electricity) when it was really needed?: No  Alcohol Use: Not on file   Transportation Needs: No Transportation Needs (03/14/2022)    PRAPARE - Therapist, art (Medical): No     Lack of Transportation (Non-Medical): No   Substance Use: Not on file   Health Literacy: Not on file   Physical Activity: Not on file   Interpersonal Safety: Not on file   Stress: Not on file   Intimate Partner Violence: Not on file   Depression: Not on file   Social Connections: Not on file       Complex Discharge Information    Is patient identified as a difficult/complex discharge?: No                                                               Interventions:       Discharge Needs Assessment  Concerns to be Addressed: care coordination/care conferences, adjustment to diagnosis/illness    Clinical Risk Factors: Principal Diagnosis: Cancer, Stroke, COPD, Heart Failure, AMI, Pneumonia, Joint Replacment, Multiple Diagnoses (Chronic)    Barriers to taking medications: No    Prior overnight hospital stay or ED visit in last 90 days: Yes                   Equipment Needed After Discharge: other (see comments) (infusion supplies)    Discharge Facility/Level of Care Needs:      Readmission  Risk of Unplanned Readmission Score: UNPLANNED READMISSION SCORE: 29.91%  Predictive Model Details          30% (High)  Factor Value    Calculated 03/14/2022 16:04 22% Number of active inpatient medication orders 42    Onset Risk of Unplanned Readmission Model 17% Number of ED visits in last six months 4     15% Number of hospitalizations in last year 4     7% Active antipsychotic inpatient medication order present     7% ECG/EKG order present in last 6 months     6% Encounter of ten days or longer in last year present     6% Diagnosis of electrolyte disorder present     5% Imaging order present in last 6 months     5% Latest hemoglobin low (9.0 g/dL)     5% Phosphorous result present     3% Active corticosteroid inpatient medication order present     1% Future appointment scheduled     1% Age 12     1% Current length of stay 1.795 days     1% Active ulcer inpatient medication order present      Readmitted Within the Last 30 Days? (No if blank) Yes  Patient at risk for readmission?: Yes    Discharge Plan       Expected Discharge Date: 03/15/2022    Expected Transfer from Critical Care:      Quality data for continuing care services shared with patient and/or representative?: Yes  Patient and/or family were provided with choice of facilities / services that are available and appropriate to meet post hospital care needs?: Yes       Initial Assessment complete?: Yes

## 2022-03-15 NOTE — Unmapped (Signed)
Problem: Adult Inpatient Plan of Care  Goal: Plan of Care Review  Outcome: Progressing  Goal: Patient-Specific Goal (Individualized)  Outcome: Progressing  Goal: Absence of Hospital-Acquired Illness or Injury  Outcome: Progressing  Goal: Optimal Comfort and Wellbeing  Outcome: Progressing  Goal: Readiness for Transition of Care  Outcome: Progressing  Goal: Rounds/Family Conference  Outcome: Progressing     Problem: Latex Allergy  Goal: Absence of Allergy Symptoms  Outcome: Progressing     Problem: Gastrointestinal Bleeding  Goal: Optimal Coping with Acute Illness  Outcome: Progressing  Goal: Hemostasis  Outcome: Progressing     Problem: Pain Acute  Goal: Optimal Pain Control and Function  Outcome: Progressing

## 2022-03-16 LAB — CBC W/ AUTO DIFF
BASOPHILS ABSOLUTE COUNT: 0 10*9/L (ref 0.0–0.1)
BASOPHILS RELATIVE PERCENT: 0.4 %
EOSINOPHILS ABSOLUTE COUNT: 0.2 10*9/L (ref 0.0–0.5)
EOSINOPHILS RELATIVE PERCENT: 1.9 %
HEMATOCRIT: 26.1 % — ABNORMAL LOW (ref 34.0–44.0)
HEMOGLOBIN: 8.7 g/dL — ABNORMAL LOW (ref 11.3–14.9)
LYMPHOCYTES ABSOLUTE COUNT: 1.8 10*9/L (ref 1.1–3.6)
LYMPHOCYTES RELATIVE PERCENT: 16.8 %
MEAN CORPUSCULAR HEMOGLOBIN CONC: 33.5 g/dL (ref 32.0–36.0)
MEAN CORPUSCULAR HEMOGLOBIN: 27.4 pg (ref 25.9–32.4)
MEAN CORPUSCULAR VOLUME: 81.8 fL (ref 77.6–95.7)
MEAN PLATELET VOLUME: 9.6 fL (ref 6.8–10.7)
MONOCYTES ABSOLUTE COUNT: 1.1 10*9/L — ABNORMAL HIGH (ref 0.3–0.8)
MONOCYTES RELATIVE PERCENT: 9.6 %
NEUTROPHILS ABSOLUTE COUNT: 7.8 10*9/L (ref 1.8–7.8)
NEUTROPHILS RELATIVE PERCENT: 71.3 %
PLATELET COUNT: 254 10*9/L (ref 150–450)
RED BLOOD CELL COUNT: 3.18 10*12/L — ABNORMAL LOW (ref 3.95–5.13)
RED CELL DISTRIBUTION WIDTH: 15 % (ref 12.2–15.2)
WBC ADJUSTED: 10.9 10*9/L (ref 3.6–11.2)

## 2022-03-16 LAB — BASIC METABOLIC PANEL
ANION GAP: 3 mmol/L — ABNORMAL LOW (ref 5–14)
BLOOD UREA NITROGEN: 15 mg/dL (ref 9–23)
BUN / CREAT RATIO: 36
CALCIUM: 8.9 mg/dL (ref 8.7–10.4)
CHLORIDE: 109 mmol/L — ABNORMAL HIGH (ref 98–107)
CO2: 29 mmol/L (ref 20.0–31.0)
CREATININE: 0.42 mg/dL — ABNORMAL LOW
EGFR CKD-EPI (2021) FEMALE: >90 mL/min/{1.73_m2} (ref >=60)
GLUCOSE RANDOM: 116 mg/dL (ref 70–179)
POTASSIUM: 4.4 mmol/L (ref 3.4–4.8)
SODIUM: 141 mmol/L (ref 135–145)

## 2022-03-16 LAB — MAGNESIUM: MAGNESIUM: 2.1 mg/dL (ref 1.6–2.6)

## 2022-03-16 LAB — PHOSPHORUS: PHOSPHORUS: 3.6 mg/dL (ref 2.4–5.1)

## 2022-03-16 MED ADMIN — oxyCODONE (ROXICODONE) immediate release tablet 10 mg: 10 mg | ORAL | @ 02:00:00 | Stop: 2022-03-25

## 2022-03-16 MED ADMIN — famotidine (PEPCID) tablet 20 mg: 20 mg | ORAL | @ 02:00:00

## 2022-03-16 MED ADMIN — oxyCODONE (ROXICODONE) immediate release tablet 10 mg: 10 mg | ORAL | @ 23:00:00 | Stop: 2022-03-25

## 2022-03-16 MED ADMIN — gabapentin (NEURONTIN) capsule 200 mg: 200 mg | ORAL | @ 02:00:00

## 2022-03-16 MED ADMIN — HYDROmorphone (PF) injection Syrg 0.5 mg: .5 mg | INTRAVENOUS | @ 01:00:00 | Stop: 2022-03-29

## 2022-03-16 MED ADMIN — lidocaine 4 % patch 2 patch: 2 | TRANSDERMAL | @ 13:00:00

## 2022-03-16 MED ADMIN — HYDROmorphone (PF) injection Syrg 0.5 mg: .5 mg | INTRAVENOUS | @ 22:00:00 | Stop: 2022-03-29

## 2022-03-16 MED ADMIN — acetaminophen (TYLENOL) tablet 1,000 mg: 1000 mg | ORAL | @ 05:00:00 | Stop: 2022-03-16

## 2022-03-16 MED ADMIN — HYDROmorphone (PF) injection Syrg 0.5 mg: .5 mg | INTRAVENOUS | @ 06:00:00 | Stop: 2022-03-16

## 2022-03-16 MED ADMIN — HYDROmorphone (PF) injection Syrg 0.5 mg: .5 mg | INTRAVENOUS | @ 10:00:00 | Stop: 2022-03-29

## 2022-03-16 MED ADMIN — Parenteral Nutrition (CENTRAL): INTRAVENOUS | @ 05:00:00 | Stop: 2022-03-17

## 2022-03-16 MED ADMIN — ciprofloxacin HCl (CIPRO) tablet 500 mg: 500 mg | ORAL | @ 13:00:00 | Stop: 2022-04-08

## 2022-03-16 MED ADMIN — mirtazapine (REMERON) tablet 7.5 mg: 7.5 mg | ORAL | @ 02:00:00

## 2022-03-16 MED ADMIN — oxyCODONE (ROXICODONE) immediate release tablet 10 mg: 10 mg | ORAL | @ 13:00:00 | Stop: 2022-03-25

## 2022-03-16 MED ADMIN — fat emulsion 20 % with fish oil (SMOFlipid) infusion 250 mL: 250 mL | INTRAVENOUS | @ 05:00:00 | Stop: 2022-03-16

## 2022-03-16 MED ADMIN — ondansetron (ZOFRAN) injection 4 mg: 4 mg | INTRAVENOUS | @ 06:00:00 | Stop: 2022-03-16

## 2022-03-16 MED ADMIN — acetaminophen (TYLENOL) oral liquid: 1000 mg | ORAL | @ 16:00:00

## 2022-03-16 MED ADMIN — OLANZapine zydis (ZyPREXA) disintegrating tablet 7.5 mg: 7.5 mg | ORAL | @ 02:00:00

## 2022-03-16 MED ADMIN — OLANZapine (ZyPREXA) tablet 1.25 mg: 1.25 mg | ORAL | @ 16:00:00

## 2022-03-16 MED ADMIN — oxyCODONE (ROXICODONE) immediate release tablet 10 mg: 10 mg | ORAL | @ 19:00:00 | Stop: 2022-03-25

## 2022-03-16 MED ADMIN — HYDROmorphone (PF) injection Syrg 0.5 mg: .5 mg | INTRAVENOUS | @ 16:00:00 | Stop: 2022-03-29

## 2022-03-16 MED ADMIN — ciprofloxacin HCl (CIPRO) tablet 500 mg: 500 mg | ORAL | @ 02:00:00 | Stop: 2022-04-08

## 2022-03-16 NOTE — Unmapped (Signed)
Internal Medicine (MEDW) Progress Note    Assessment & Plan:   [name] is a 22 y.o. 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      Principal Problem:    GI bleeding  Active Problems:    [name]    [name]    Pain    Iron deficiency anemia due to chronic blood loss    Severe protein-calorie malnutrition (CMS-HCC)    Lower abdominal pain    Generalized anxiety disorder with panic attacks    MDD (major depressive disorder)    Small intestinal bacterial overgrowth (SIBO)  Resolved Problems:    * No resolved hospital problems. *    Active Problems    C/f Ileus vs Narcotic Bowel - Acute on Chronic Abdominal Pain - Desmoid Tumors   Acutely worsened abdominal pain with N/V 1/5, CT suggestive of ileus. Pain is persistent today despite IV Dilaudid. Had a bowel movement last night without any BRBPR. Encouraged ambulation. NGT in place and providing some relief. Remains NPO. Seen by GI today, belief that component of pain and ileus is related to intermittent tethering of bowel with desmoid tumors. Will continue to support her with pain medicines but we are in a challenging situation balancing opiates with ongoing ileus. Will schedule liquid Tylenol as pt has been too nauseated to take all the capsules. Would avoid Movantik for the foreseeable future given intermittent obstruction. Per recommendation from GI, when the time comes to advance her diet, she should be on a low residue diet.   - NGT for decompression  - NPO (sips/meds are OK)  - Start Miralax, one capful daily  - Continue treatment for presumed SIBO with cipro 500mg  BID x14 days (EOT 1/16) [needs to be oral not IV]  - Pain Regimen:   - PRN IV Dilaudid q6h for now, attempt to wean off - Liquid Tylenol 1,000 mg q8hr  - Oxy 10mg  q6h PRN  - Gabapentin 200mg  at bedtime  - Baclofen 5mg  TID PRN   - Lidocaine patch, voltaren gel    BRBPR I Acute on Chronic Anemia  Slowing. No BRBPR over the past 24 hours. Known ulcer at ileoanal anastomosis noted on pouchoscopy during last admission. Re-bleed could be a result of sorafenib resumption, of which she has stopped indefinitely.  GI w/ no indication for repeat endoscopic eval at this time. Follow up on 1/22 w/ Dr. [name]. Hgb continues to slowly downtrend, no hemodynamically significant bleeding at this time.  - continue trending Hgb daily   - Maintain active type and screen and adequate IV access with at least 2 large bore IV  - Transfuse Hgb <7    Severe Malnutrition 2/2 [name] and abdominal desmoid tumors   She is currently receiving TPN 5/7 days of the week and is being followed by Dr. [name], outpatient GI  - TPN consult  - When she resumes oral nutrition, should be on a low res diet     [name]/Desmoid Tumors   Thoughtful note on 03/11/22 from Dr. [name]. Holding sorafenib for the foreseeable future. Looking into starting nirogacestat as no known bleeding risk. Believes that mesenteric lymph nodes are reactive in the setting of TPN, pouchitis/ulcer, malnutrition, and recurrent GIB. Their fertility counsellor is aware of the potential switch to nirogacestat and will be reaching out to  talk with pt and her mother.   - Follow up w/ Dr. [name] 03/18/22  - Hold sorafenib daily given bleeding     Anxiety   Pt in talks with Adolescent Young Adult program at [facility]. Endorsing worsening anxiety and insomnia today. Encouraged use of PRN Zyprexa. Added on PRN Atarax.   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 Pain is persistent despite IV Dilaudid. Has been walking the unit as able. Emesis x 2 yesterday. NGT in place.  Denies chest pain or SOB.    Objective:   Temp:  [36.4 ??C (97.5 ??F)-37 ??C (98.6 ??F)] 36.7 ??C (98.1 ??F)  Heart Rate:  [83-102] 91  Resp:  [14-16] 14  BP: (108-118)/(58-73) 118/73  SpO2:  [97 %-98 %] 98 %    Gen: NAD, sitting upright in bed, NGT draining clear/light green fluid with bubbles in it  HENT: atraumatic, normocephalic  Heart: RRR  Lungs: CTAB anteriorly, no crackles or wheezes. No increased WOB on RA   Abdomen: soft, distended. Active bowel sounds. Diffusely TTP    Gus Lockie Pares, MD   [facility] Internal Medicine PGY-1

## 2022-03-16 NOTE — Unmapped (Signed)
Gastroenterology (Luminal) Consult Service   Progress Note         Assessment and Recommendations:   [name] [name] is a 22 y.o. 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 The patient is seen in consultation at the request of [name], MD ([facility] ([facility])) for repeat CTAP 1/5 showing possible ileus.    Patient with ongoing abdominal pain and distention despite attempt at decreasing opiates (did not tolerate) and treating empirically for possible SIBO. Unfortunately with subsequent development of emesis and CT suggesting possible ileus. Her current NG output is suggestive of poor forward transit and by her description wonder if she may have had a transient SBO possible at the site of tethering although imaging does not show any transition point. At this stage will need to treat supportively for possible ileus and monitor for improvement. Fortunately having bowel movements/ passing gas.     Recommendations:   - continue PO cipro (do not convert to IV as this is less helpful in treating SIBO)  - Monitor NG output and continue NG intermittent suction until improvement  - When able to resume diet would favor low residue diet and daily Miralax  - Defer to pain management regarding optimal pain regimen with goal to optimize non-narcotic pain regimen         Issues Impacting Complexity of Management:  -None    Recommendations discussed with the patient's primary team. We will continue to follow along with you.    For questions, contact the on-call fellow for the Gastroenterology (Luminal) Consult Service.    Subjective:   Pt feeling better after NGT was placed yesterday. Still with continued nausea and abd pain but feels her pain is better controlled with meds now.     CT obtained on 1/5 by primary team which showed distention of small bowel loops adjacent to infiltrative mesenteric lesion, concern for ileus. NGT cannister with 500 ml of bilious yellow fluid.         Objective:   Temp:  [36.4 ??C (97.5 ??F)-36.8 ??C (98.2 ??F)] 36.6 ??C (97.9 ??F)  Heart Rate:  [83-96] 88  Resp:  [14-18] 18  BP: (108-118)/(59-73) 116/72  SpO2:  [97 %-98 %] 98 %    Gen: WDWN female in NAD, answers questions appropriately  Abdomen: Soft, NTND  Extremities: No edema in the BLEs    Pertinent Labs/Studies:  -I have reviewed the patient's labs from 1/7 which show stable electrolytes

## 2022-03-16 NOTE — Unmapped (Signed)
Problem: Adult Inpatient Plan of Care  Goal: Plan of Care Review  Outcome: Progressing  Goal: Patient-Specific Goal (Individualized)  Outcome: Progressing  Goal: Absence of Hospital-Acquired Illness or Injury  Outcome: Progressing  Intervention: Identify and Manage Fall Risk  Recent Flowsheet Documentation  Taken 03/15/2022 2200 by Crista Elliot, RN  Safety Interventions:   environmental modification   fall reduction program maintained   lighting adjusted for tasks/safety   low bed   nonskid shoes/slippers when out of bed  Intervention: Prevent Skin Injury  Recent Flowsheet Documentation  Taken 03/16/2022 0600 by Crista Elliot, RN  Positioning for Skin: Supine/Back  Taken 03/16/2022 0400 by Crista Elliot, RN  Positioning for Skin: Right  Taken 03/16/2022 0200 by Crista Elliot, RN  Positioning for Skin: Right  Taken 03/16/2022 0000 by Crista Elliot, RN  Positioning for Skin: Supine/Back  Taken 03/15/2022 2200 by Crista Elliot, RN  Positioning for Skin: Right  Device Skin Pressure Protection:   absorbent pad utilized/changed   adhesive use limited  Skin Protection: adhesive use limited  Taken 03/15/2022 2000 by Crista Elliot, RN  Positioning for Skin: Supine/Back  Device Skin Pressure Protection:   absorbent pad utilized/changed   adhesive use limited  Skin Protection:   adhesive use limited   incontinence pads utilized  Goal: Optimal Comfort and Wellbeing  Outcome: Progressing  Goal: Readiness for Transition of Care  Outcome: Progressing  Goal: Rounds/Family Conference  Outcome: Progressing     Problem: Latex Allergy  Goal: Absence of Allergy Symptoms  Outcome: Progressing     Problem: Gastrointestinal Bleeding  Goal: Optimal Coping with Acute Illness  Outcome: Progressing  Goal: Hemostasis  Outcome: Progressing     Problem: Pain Acute  Goal: Optimal Pain Control and Function  Outcome: Progressing  Intervention: Prevent or Manage Pain  Recent Flowsheet Documentation  Taken 03/15/2022 2200 by Crista Elliot, RN  Sleep/Rest Enhancement: regular sleep/rest pattern promoted

## 2022-03-17 LAB — CBC W/ AUTO DIFF
BASOPHILS ABSOLUTE COUNT: 0 10*9/L (ref 0.0–0.1)
BASOPHILS RELATIVE PERCENT: 0.3 %
EOSINOPHILS ABSOLUTE COUNT: 0.2 10*9/L (ref 0.0–0.5)
EOSINOPHILS RELATIVE PERCENT: 2.1 %
HEMATOCRIT: 26.4 % — ABNORMAL LOW (ref 34.0–44.0)
HEMOGLOBIN: 8.6 g/dL — ABNORMAL LOW (ref 11.3–14.9)
LYMPHOCYTES ABSOLUTE COUNT: 1.9 10*9/L (ref 1.1–3.6)
LYMPHOCYTES RELATIVE PERCENT: 20.7 %
MEAN CORPUSCULAR HEMOGLOBIN CONC: 32.6 g/dL (ref 32.0–36.0)
MEAN CORPUSCULAR HEMOGLOBIN: 26.6 pg (ref 25.9–32.4)
MEAN CORPUSCULAR VOLUME: 81.7 fL (ref 77.6–95.7)
MEAN PLATELET VOLUME: 9.3 fL (ref 6.8–10.7)
MONOCYTES ABSOLUTE COUNT: 1.2 10*9/L — ABNORMAL HIGH (ref 0.3–0.8)
MONOCYTES RELATIVE PERCENT: 13.4 %
NEUTROPHILS ABSOLUTE COUNT: 5.8 10*9/L (ref 1.8–7.8)
NEUTROPHILS RELATIVE PERCENT: 63.5 %
PLATELET COUNT: 258 10*9/L (ref 150–450)
RED BLOOD CELL COUNT: 3.23 10*12/L — ABNORMAL LOW (ref 3.95–5.13)
RED CELL DISTRIBUTION WIDTH: 14.8 % (ref 12.2–15.2)
WBC ADJUSTED: 9.1 10*9/L (ref 3.6–11.2)

## 2022-03-17 LAB — PHOSPHORUS: PHOSPHORUS: 3.4 mg/dL (ref 2.4–5.1)

## 2022-03-17 LAB — BILIRUBIN, DIRECT: BILIRUBIN DIRECT: <0.1 mg/dL (ref 0.00–0.30)

## 2022-03-17 LAB — COMPREHENSIVE METABOLIC PANEL
ALBUMIN: 3.2 g/dL — ABNORMAL LOW (ref 3.4–5.0)
ALKALINE PHOSPHATASE: 44 U/L — ABNORMAL LOW (ref 46–116)
ALT (SGPT): 67 U/L — ABNORMAL HIGH (ref 10–49)
ANION GAP: 5 mmol/L (ref 5–14)
AST (SGOT): 38 U/L — ABNORMAL HIGH (ref <=34)
BILIRUBIN TOTAL: 0.2 mg/dL — ABNORMAL LOW (ref 0.3–1.2)
BLOOD UREA NITROGEN: 17 mg/dL (ref 9–23)
BUN / CREAT RATIO: 38
CALCIUM: 8.8 mg/dL (ref 8.7–10.4)
CHLORIDE: 107 mmol/L (ref 98–107)
CO2: 29 mmol/L (ref 20.0–31.0)
CREATININE: 0.45 mg/dL — ABNORMAL LOW
EGFR CKD-EPI (2021) FEMALE: >90 mL/min/{1.73_m2} (ref >=60)
GLUCOSE RANDOM: 107 mg/dL (ref 70–179)
POTASSIUM: 3.9 mmol/L (ref 3.4–4.8)
PROTEIN TOTAL: 6.2 g/dL (ref 5.7–8.2)
SODIUM: 141 mmol/L (ref 135–145)

## 2022-03-17 LAB — BASIC METABOLIC PANEL
ANION GAP: 5 mmol/L (ref 5–14)
BLOOD UREA NITROGEN: 14 mg/dL (ref 9–23)
BUN / CREAT RATIO: 38
CALCIUM: 8.9 mg/dL (ref 8.7–10.4)
CHLORIDE: 109 mmol/L — ABNORMAL HIGH (ref 98–107)
CO2: 28 mmol/L (ref 20.0–31.0)
CREATININE: 0.37 mg/dL — ABNORMAL LOW
EGFR CKD-EPI (2021) FEMALE: >90 mL/min/{1.73_m2} (ref >=60)
GLUCOSE RANDOM: 103 mg/dL (ref 70–179)
POTASSIUM: 3.8 mmol/L (ref 3.4–4.8)
SODIUM: 142 mmol/L (ref 135–145)

## 2022-03-17 LAB — MAGNESIUM: MAGNESIUM: 2.1 mg/dL (ref 1.6–2.6)

## 2022-03-17 MED ADMIN — OLANZapine (ZyPREXA) tablet 1.25 mg: 1.25 mg | ORAL | @ 13:00:00

## 2022-03-17 MED ADMIN — diclofenac sodium (VOLTAREN) 1 % gel 2 g: 2 g | TOPICAL | @ 03:00:00

## 2022-03-17 MED ADMIN — HYDROmorphone (PF) injection Syrg 0.5 mg: .5 mg | INTRAVENOUS | @ 04:00:00 | Stop: 2022-03-29

## 2022-03-17 MED ADMIN — oxyCODONE (ROXICODONE) immediate release tablet 10 mg: 10 mg | ORAL | @ 13:00:00 | Stop: 2022-03-25

## 2022-03-17 MED ADMIN — lidocaine 2000 mg in dextrose 5 % 250 mL (8 mg/mL) infusion PMB: 1 mg/kg/h | INTRAVENOUS | Stop: 2022-03-20

## 2022-03-17 MED ADMIN — mirtazapine (REMERON) tablet 7.5 mg: 7.5 mg | ORAL | @ 03:00:00

## 2022-03-17 MED ADMIN — HYDROmorphone (PF) injection Syrg 0.5 mg: .5 mg | INTRAVENOUS | @ 17:00:00 | Stop: 2022-03-17

## 2022-03-17 MED ADMIN — acetaminophen (TYLENOL) oral liquid: 1000 mg | ORAL | @ 03:00:00

## 2022-03-17 MED ADMIN — famotidine (PEPCID) tablet 20 mg: 20 mg | ORAL | @ 03:00:00

## 2022-03-17 MED ADMIN — oxyCODONE (ROXICODONE) immediate release tablet 10 mg: 10 mg | ORAL | @ 03:00:00 | Stop: 2022-03-25

## 2022-03-17 MED ADMIN — HYDROmorphone (PF) injection Syrg 0.5 mg: .5 mg | INTRAVENOUS | @ 23:00:00 | Stop: 2022-03-17

## 2022-03-17 MED ADMIN — fat emulsion 20 % with fish oil (SMOFlipid) infusion 250 mL: 250 mL | INTRAVENOUS | @ 04:00:00 | Stop: 2022-03-17

## 2022-03-17 MED ADMIN — acetaminophen (TYLENOL) oral liquid: 1000 mg | ORAL | @ 19:00:00

## 2022-03-17 MED ADMIN — ciprofloxacin HCl (CIPRO) tablet 500 mg: 500 mg | ORAL | @ 03:00:00 | Stop: 2022-04-08

## 2022-03-17 MED ADMIN — gabapentin (NEURONTIN) capsule 200 mg: 200 mg | ORAL | @ 03:00:00

## 2022-03-17 MED ADMIN — diclofenac sodium (VOLTAREN) 1 % gel 2 g: 2 g | TOPICAL | @ 11:00:00

## 2022-03-17 MED ADMIN — ciprofloxacin HCl (CIPRO) tablet 500 mg: 500 mg | ORAL | @ 13:00:00 | Stop: 2022-04-08

## 2022-03-17 MED ADMIN — OLANZapine zydis (ZyPREXA) disintegrating tablet 7.5 mg: 7.5 mg | ORAL | @ 03:00:00

## 2022-03-17 MED ADMIN — Parenteral Nutrition (CENTRAL): INTRAVENOUS | @ 04:00:00 | Stop: 2022-03-17

## 2022-03-17 MED ADMIN — HYDROmorphone (PF) injection Syrg 0.5 mg: .5 mg | INTRAVENOUS | @ 11:00:00 | Stop: 2022-03-17

## 2022-03-17 MED ADMIN — oxyCODONE (ROXICODONE) immediate release tablet 10 mg: 10 mg | ORAL | @ 20:00:00 | Stop: 2022-03-25

## 2022-03-17 MED ADMIN — lidocaine 4 % patch 2 patch: 2 | TRANSDERMAL | @ 13:00:00 | Stop: 2022-03-17

## 2022-03-17 NOTE — Unmapped (Signed)
Internal Medicine (MEDW) Progress Note    Assessment & Plan:   [name] [name] is a 22 y.o. 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      Principal Problem:    GI bleeding  Active Problems:    [name]    [name]    Pain    Iron deficiency anemia due to chronic blood loss    Severe protein-calorie malnutrition (CMS-HCC)    Lower abdominal pain    Generalized anxiety disorder with panic attacks    MDD (major depressive disorder)    Small intestinal bacterial overgrowth (SIBO)    Ileus (CMS-HCC)  Resolved Problems:    * No resolved hospital problems. *    Active Problems    C/f Ileus vs Narcotic Bowel - Acute on Chronic Abdominal Pain - Desmoid Tumors   Ileus is resolving. Pain is persistent and under poor control despite IV Dilaudid 0.5 mg x 4 and PO Oxy 10 mg x 4 yesterday. Reported 3 small, irregular bowel movements overnight. NGT with intermittent suction is now causing pain and not decompressing much further, plan to remove today and adv to a low residue diet. Re-engaged with chronic pain who recommended a scheduled nighttime Dilaudid dose and initiation of a lidocaine infusion in an effort to wean her IV opiates.  - Removed NGT  - Adv diet as tolerated (low residue)  - Continue daily MiraLax  - Continue Cipro 500mg  BID x14 days (EOT 1/16) [needs to be oral not IV]  - Pain Regimen:   - Initiate Lidocaine infusion, 1 mg/kg/hr    - PRN IV Dilaudid q6h for now, attempt to wean off once pain well controlled with lidocaine infusion   - Start Dilaudid 3 mg oral tablet once nightly as needed    - Liquid Tylenol 1,000 mg q8hr  - Oxy 10mg  q6h PRN  - Gabapentin 200mg  at bedtime  - Baclofen 5mg  TID PRN   - Discontinue lidocaine patch    BRBPR I Acute on Chronic Anemia  Slowing. No BRBPR over the past 48 hours. Known ulcer at ileoanal anastomosis noted on pouchoscopy during last admission. Re-bleed could be a result of sorafenib resumption, of which she has stopped indefinitely.  GI w/ no indication for repeat endoscopic eval at this time. Follow up on 1/22 w/ Dr. [name]. Hgb stable.  - continue trending Hgb daily   - Maintain active type and screen and adequate IV access with at least 2 large bore IV  - Transfuse Hgb <7    Severe Malnutrition 2/2 [name] and abdominal desmoid tumors   She is currently receiving TPN 5/7 days of the week and is being followed by Dr. [name], outpatient GI  - TPN consult  - When she resumes oral nutrition, should be on a low res diet     [name]/Desmoid Tumors   Thoughtful note on 03/11/22 from Dr. [name]. Holding sorafenib for the foreseeable future. Looking into starting nirogacestat as no known bleeding risk. Believes that mesenteric lymph nodes are reactive in the setting of TPN, pouchitis/ulcer, malnutrition, and recurrent GIB. Their fertility counsellor is aware of the potential switch to nirogacestat and will be reaching out to talk with pt and her mother.   - Follow up w/ Dr. [name] 03/18/22  - Hold sorafenib daily given bleeding  Anxiety   Pt in talks with Adolescent Young Adult program at [facility]. Endorsing worsening anxiety and insomnia today. Encouraged use of PRN Zyprexa. Added on PRN Atarax.   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 NGT is causing discomfort when intermittent suction is applied. Pain remains under poor control despite IV pain meds. Denies chest pain, SOB, n/v/d.    No acute events overnight.     Objective:   Temp:  [36.5 ??C (97.7 ??F)-37 ??C (98.6 ??F)] 37 ??C (98.6 ??F)  Heart Rate:  [83-103] 93  Resp:  [18-19] 19  BP: (103-135)/(53-78) 113/62  SpO2:  [97 %-99 %] 97 %    Gen: NAD, sitting upright in bed, NGT draining clear/light green fluid with bubbles in it  HENT: atraumatic, normocephalic  Heart: RRR  Lungs: CTAB anteriorly, no crackles or wheezes. No increased WOB on RA   Abdomen: soft, distended. Active bowel sounds. Diffusely TTP    Gus Lockie Pares, MD   [facility] Internal Medicine PGY-1

## 2022-03-17 NOTE — Unmapped (Signed)
Patient is resting in bed with normal vitals and respirations. Pain has been addressed and reassessed. No distress is noted. Mother is bedside. TPN and lipids are currently infusing for 12 hours, per order. Plan of care is ongoing.    Problem: Adult Inpatient Plan of Care  Goal: Plan of Care Review  Outcome: Progressing  Goal: Patient-Specific Goal (Individualized)  Outcome: Progressing  Goal: Absence of Hospital-Acquired Illness or Injury  Outcome: Progressing  Intervention: Identify and Manage Fall Risk  Recent Flowsheet Documentation  Taken 03/16/2022 1958 by Clydie Braun, RN  Safety Interventions:   lighting adjusted for tasks/safety   low bed  Intervention: Prevent Skin Injury  Recent Flowsheet Documentation  Taken 03/16/2022 1958 by Clydie Braun, RN  Positioning for Skin: Supine/Back  Skin Protection: adhesive use limited  Goal: Optimal Comfort and Wellbeing  Outcome: Progressing  Goal: Readiness for Transition of Care  Outcome: Progressing  Goal: Rounds/Family Conference  Outcome: Progressing     Problem: Latex Allergy  Goal: Absence of Allergy Symptoms  Outcome: Progressing     Problem: Gastrointestinal Bleeding  Goal: Optimal Coping with Acute Illness  Outcome: Progressing  Goal: Hemostasis  Outcome: Progressing     Problem: Pain Acute  Goal: Optimal Pain Control and Function  Outcome: Progressing

## 2022-03-17 NOTE — Unmapped (Signed)
VENOUS ACCESS TEAM PROCEDURE    Nurse request was placed for a "PIV by Venous Access Team (VAT)".  Patient was assessed at bedside for placement of a PIV. PPE were donned per protocol.  Access was obtained. Blood return noted.  Dressing intact and device well secured.  Flushed with normal saline.  See LDA for details.  Pt advised to inform RN of any s/s of discomfort at the PIV site.    Workup / Procedure Time:  30 minutes        RN was notified.       Thank you,     Jayvon Mounger G Gissella Niblack, RN Venous Access Team

## 2022-03-17 NOTE — Unmapped (Signed)
Department of Anesthesiology  Pain Medicine Division    Chronic Pain Followup Inpatient Follow-Up Note    Requesting Attending Physician:  [name], MD  Service Requesting Consult:  [facility] (MDW)    Assessment/Recommendations:  22 y.o. 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 Follows with [name] at [facility].    Our service is re-engaging with Ms. Duplessis's care as she has had an exacerbation in her abdominal pain after discontinuation of her hydromorphone IV on 1/5, the exacerbation of abdominal pain over the weekend was likely multifactorial in etiology - there were concerns of possible, transient SBO at a site of tethering in the small intestine on  CT and an ongoing, opioid-induced ileus. This exacerbation in pain necessitating reinitiation of IV hydromorphone.  She states that her pain is currently appropriately controlled at this time.  However, given concerns of her ongoing ileus and escalation of opioids; the primary team had wanted our team to reengage for assistance in reducing opioid requirements.    Her primary pain generators include acute abdominal pain from possible transient small bowel obstruction, chronic pain in the setting of multiple abdominal surgeries and desmoid tumors.    Interval: Her pain is appropriately controlled on her current analgesic regimen; though, there is a need to decrease opioid requirements in the setting of an ongoing opiate induced ileus.  The bulk of her pain is in her bilateral lower abdomen.  She is passing flatus and is having watery bowel movements.  Her NG tube was placed on 1/6, and has been having copious, bilious output since.  She currently is TPN dependent.  She was not ready to trial a Butrans patch at this time.  The patient and her mother appropriately understood the necessity to attempt and de-escalation of opioid therapy in order to medicate gastrointestinal side effects at this time.    Recommendations:  -The chronic pain service is a consult service and does not place orders, just makes recommendations (except ketamine and lidocaine infusions)   -Please evaluate all patients on opioids for appropriateness of prescribing narcan at discharge.  The chronic pain service can assist with this.  Nasal narcan is covered by most insurances.  -Recommendations given apply to the current hospitalization and do not reflect long term recommendations.    Changes to current analgesic regimen:  -Start lidocaine infusion at 1 mg/kg/hr (see details below; our team will handle orders only for the lidocaine infusion)  -Start hydromorphone 3 mg oral tablet once nightly as needed   -Discontinue lidocaine patch 4% for 12 hours a day    -Patient is on a low dose lidocaine infusion for pain   -All lidocaine orders will be managed by the [facility] Chronic Pain Service only   -Lidocaine requires dedicated IV (PIV or central line lumen)   -Start lidocaine infusion at 1mg /kg/hr (ideal body weight)   -Patient location: dose can be increased no sooner than every 4 hours   -Day 0 (max-3 days). Started (date): 03/17/2022   -Prior to starting: Recent EKG, CMP (creatinine, LFTs), urine pregnancy test (if applicable)   -Daily CMP (creatinine and LFTs daily) while running   -Lipid emulsion available PRN: Local Anesthetic Systemic Toxicity   -Please contact the Chronic Pain service with any questions or concerns about lidocaine infusion.    Continue without changes:  -Continue acetaminophen 1000 mg  oral solution every 8 hours scheduled  -Continue diclofenac 1% gel topically 4 times a day scheduled  -Continue gabapentin 200 mg oral capsule nightly scheduled  -Continue baclofen 5 mg oral tablet 3 times a day as needed for muscle spasms  -Continue oxycodone 10 mg oral tablet every 4 hours as needed for moderate to severe pain  -Continue hydromorphone 0.5 mg IV every 6 hours as needed for refractory, breakthrough pain (goal of weaning to off over the course of the next few days as abdominal pain improves)  -Continue naloxone intranasal as needed in the event of opioid induced respiratory depression or oversedation    Home MME: 135  Current MME: 85    We will continue to follow.    Naloxone Rx at discharge?  Is patient on opioids? Yes.  1)Is dose >50MME?  Yes.  2) Is patient prescribed a benzodiazepine (w opioids)? No.  3)Hx of overdose?  No.  4) Hx of substance use disorder? No.  5) Opioids likely to last greater than a week after discharge? Yes.    If yes to 2 or more, prescribe naloxone at discharge.  Nasal narcan for most insured (Nasal narcan 4mg /actuation, prescribe 1 kit, instructions at [email or web address]).  For uninsured, chronic pain can work to assist in finding an option.  OTC nasal narcan now available at most pharmacies for around $45.    Interim History  There were no Acute Events Overnight.  The patient is obtaining adequate pain relief on current medication regimen and feels that their pain is Somewhat controlled    Analgesia Evaluation:  Pain at minimum: 6/10  Pain at maximum: 7/10    Current pain medication regimen (including how frequent PRN's were used):  IV HM 0.5 x 4  PO Oxy 10 x4       Inpatient Medications  Current Facility-Administered Medications   Medication Dose Route Frequency Provider Last Rate Last Admin    acetaminophen (TYLENOL) oral liquid  1,000 mg Oral Q8H Muthukkumar, Rashmi, MD   1,000 mg at 03/16/22 2136    baclofen (LIORESAL) tablet 5 mg  5 mg Oral TID PRN Linna Hoff, MD   5 mg at 03/15/22 1728    ciprofloxacin HCl (CIPRO) tablet 500 mg  500 mg Oral Q12H SCH Durene Romans, MD   500 mg at 03/17/22 0819    clobetasol (TEMOVATE) 0.05 % ointment   Topical BID PRN Linna Hoff, MD        diclofenac sodium (VOLTAREN) 1 % gel 2 g  2 g Topical QID Linna Hoff, MD   2 g at 03/17/22 0545    famotidine (PEPCID) tablet 20 mg  20 mg Oral Nightly Achille Rich, MD   20 mg at 03/16/22 2137    Parenteral Nutrition (CENTRAL)   Intravenous Continuous Cleotis Nipper, MD 100 mL/hr at 03/16/22 2311 New Bag at 03/16/22 2311    And    fat emulsion 20 % with fish oil (SMOFlipid) infusion 250 mL  250 mL Intravenous Continuous Cleotis Nipper, MD 20.8 mL/hr at 03/16/22 2315 250 mL at 03/16/22 2315    [START ON 03/18/2022] Parenteral Nutrition (CENTRAL)   Intravenous Continuous Contarino, Collie Siad, MD        And    Melene Muller ON 03/18/2022] fat emulsion 20 % with fish oil (SMOFlipid) infusion 250 mL  250 mL Intravenous Continuous Contarino, Collie Siad, MD        gabapentin (NEURONTIN) capsule 200 mg  200 mg  Oral Nightly Linna Hoff, MD   200 mg at 03/16/22 2137    HYDROmorphone (PF) injection Syrg 0.5 mg  0.5 mg Intravenous Q6H PRN Achille Rich, MD   0.5 mg at 03/17/22 0545    hydrOXYzine (ATARAX) oral syrup  25 mg Oral Q6H PRN Muthukkumar, Rashmi, MD        hyoscyamine (LEVSIN) tablet 125 mcg  125 mcg Oral Q4H PRN Hollie Beach P, DO        lidocaine 4 % patch 2 patch  2 patch Transdermal Daily Linna Hoff, MD   2 patch at 03/17/22 0981    melatonin tablet 3 mg  3 mg Oral Nightly PRN Linna Hoff, MD        mirtazapine (REMERON) tablet 7.5 mg  7.5 mg Oral Nightly Alan Ripper, DO   7.5 mg at 03/16/22 2137    naloxone [facility]) injection 0.4 mg  0.4 mg Intravenous Once PRN Linna Hoff, MD        OLANZapine (ZyPREXA) tablet 1.25 mg  1.25 mg Oral Daily PRN Cleotis Nipper, MD   1.25 mg at 03/17/22 0819    OLANZapine zydis (ZyPREXA) disintegrating tablet 7.5 mg  7.5 mg Oral Nightly Goldbeck, Lauren D, MD   7.5 mg at 03/16/22 2137    ondansetron (ZOFRAN-ODT) disintegrating tablet 8 mg  8 mg Oral Q12H PRN Hollie Beach P, DO   8 mg at 03/12/22 1914    oxyCODONE (ROXICODONE) immediate release tablet 10 mg  10 mg Oral Q4H PRN Achille Rich, MD 10 mg at 03/17/22 0820    phenol (CHLORASEPTIC) 1.4 % spray 2 spray  2 spray Mucous Membrane Q2H PRN Goldbeck, Eliott Nine, MD        polyethylene glycol (MIRALAX) packet 17 g  17 g Oral Daily Hendrick, Rushie Chestnut, MD        simethicone (MYLICON) chewable tablet 80 mg  80 mg Oral Q6H PRN Achille Rich, MD   80 mg at 03/14/22 1810         Objective:     Vital Signs    Temp:  [36.5 ??C (97.7 ??F)-37 ??C (98.6 ??F)] 37 ??C (98.6 ??F)  Heart Rate:  [83-103] 93  Resp:  [18-19] 19  BP: (103-135)/(53-78) 113/62  MAP (mmHg):  [66-93] 76  SpO2:  [97 %-99 %] 97 %      Physical Exam    GENERAL:  Well developed, well-nourished female and is in mild distress.   HEAD/NECK:    Reveals normocephalic/atraumatic.   CARDIOVASCULAR:   RRR, no murmur  LUNGS:   Normal work of breathing, no supplemental 02  EXTREMITIES:  Warm, no clubbing, cyanosis, or edema was noted.  NEUROLOGIC:    The patient was alert and oriented times four with normal language, attention, cognition and memory. Cranial nerve exam was grossly normal.    MUSCULOSKELETAL:    Moving all extremities  SKIN:  No obvious rashes lesions or erythema  PSY:  Appropriate affect and mood.    Test Results    Lab Results   Component Value Date    CREATININE 0.37 (L) 03/17/2022     Lab Results   Component Value Date    ALKPHOS 44 (L) 03/12/2022    BILITOT <0.2 (L) 03/12/2022    BILIDIR <0.10 03/12/2022    PROT 6.1 03/12/2022    ALBUMIN 3.1 (L) 03/12/2022    ALT 8 (L) 03/12/2022    AST 14 03/12/2022  Problem List    Principal Problem:    GI bleeding  Active Problems:    [name]    [name]    Pain    Iron deficiency anemia due to chronic blood loss    Severe protein-calorie malnutrition (CMS-HCC)    Lower abdominal pain    Generalized anxiety disorder with panic attacks    MDD (major depressive disorder)    Small intestinal bacterial overgrowth (SIBO)    Ileus (CMS-HCC)

## 2022-03-17 NOTE — Unmapped (Incomplete)
Gastroenterology (Luminal) Consult Service   Progress Note         Assessment and Recommendations:   [name] [name] is a 22 y.o. 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 The patient is seen in consultation at the request of [name], MD ([facility] (MDW)) for repeat CTAP 1/5 showing possible ileus.    Ileus- improving  Patient developed abdominal pain and nausea/vomiting. CT scan 1/5 showed central loops of small bowel remain tethered by ill-defined mesenteric lesion.      Her current NG output is suggestive of poor forward transit and by her description wonder if she may have had a transient SBO possible at the site of tethering although imaging does not show any transition point. At this stage will need to treat supportively for possible ileus and monitor for improvement. Fortunately having bowel movements/ passing gas.     Recommendations:   continue PO cipro (do not convert to IV as this is less helpful in treating SIBO)  Monitor NG output and continue NG intermittent suction until improvement  When able to resume diet would favor low residue diet and daily Miralax  Defer to pain management regarding optimal pain regimen with goal to optimize non-narcotic pain regimen     Issues Impacting Complexity of Management:  -None    Recommendations discussed with the patient's primary team. We will continue to follow along with you.    For questions, contact the on-call fellow for the Gastroenterology (Luminal) Consult Service.    Subjective:   Patient is feeling better today while on NGT decompression. Had 3 bowel movements yesterday and 1 this AM. Has only had minimal output since last night from the NGT.     Objective:   Temp:  [36.5 ??C (97.7 ??F)-37 ??C (98.6 ??F)] 37 ??C (98.6 ??F)  Heart Rate:  [83-103] 93  Resp:  [18-19] 19  BP: (103-135)/(53-78) 113/62  SpO2:  [97 %-99 %] 97 %    Gen: WDWN female in NAD, answers questions appropriately  Abdomen: Soft, NTND  Extremities: No edema in the BLEs    Pertinent Labs/Studies:  -I have reviewed the patient's labs from 1/8 which show stable electrolytes

## 2022-03-18 DIAGNOSIS — D48119 Desmoid tumor: Principal | ICD-10-CM

## 2022-03-18 LAB — CBC W/ AUTO DIFF
BASOPHILS ABSOLUTE COUNT: 0 10*9/L (ref 0.0–0.1)
BASOPHILS RELATIVE PERCENT: 0.5 %
EOSINOPHILS ABSOLUTE COUNT: 0.2 10*9/L (ref 0.0–0.5)
EOSINOPHILS RELATIVE PERCENT: 3 %
HEMATOCRIT: 27 % — ABNORMAL LOW (ref 34.0–44.0)
HEMOGLOBIN: 8.9 g/dL — ABNORMAL LOW (ref 11.3–14.9)
LYMPHOCYTES ABSOLUTE COUNT: 1.9 10*9/L (ref 1.1–3.6)
LYMPHOCYTES RELATIVE PERCENT: 29.2 %
MEAN CORPUSCULAR HEMOGLOBIN CONC: 32.8 g/dL (ref 32.0–36.0)
MEAN CORPUSCULAR HEMOGLOBIN: 26.6 pg (ref 25.9–32.4)
MEAN CORPUSCULAR VOLUME: 81.1 fL (ref 77.6–95.7)
MEAN PLATELET VOLUME: 9.4 fL (ref 6.8–10.7)
MONOCYTES ABSOLUTE COUNT: 0.9 10*9/L — ABNORMAL HIGH (ref 0.3–0.8)
MONOCYTES RELATIVE PERCENT: 12.8 %
NEUTROPHILS ABSOLUTE COUNT: 3.6 10*9/L (ref 1.8–7.8)
NEUTROPHILS RELATIVE PERCENT: 54.5 %
PLATELET COUNT: 239 10*9/L (ref 150–450)
RED BLOOD CELL COUNT: 3.33 10*12/L — ABNORMAL LOW (ref 3.95–5.13)
RED CELL DISTRIBUTION WIDTH: 14.9 % (ref 12.2–15.2)
WBC ADJUSTED: 6.7 10*9/L (ref 3.6–11.2)

## 2022-03-18 LAB — MAGNESIUM: MAGNESIUM: 2 mg/dL (ref 1.6–2.6)

## 2022-03-18 LAB — PHOSPHORUS: PHOSPHORUS: 4.1 mg/dL (ref 2.4–5.1)

## 2022-03-18 MED ADMIN — OLANZapine (ZyPREXA) tablet 1.25 mg: 1.25 mg | ORAL | @ 20:00:00

## 2022-03-18 MED ADMIN — hydrOXYzine (ATARAX) tablet 25 mg: 25 mg | ORAL | @ 15:00:00 | Stop: 2022-03-18

## 2022-03-18 MED ADMIN — HYDROmorphone (PF) (DILAUDID) injection 0.25 mg: .25 mg | INTRAVENOUS | @ 19:00:00 | Stop: 2022-03-18

## 2022-03-18 MED ADMIN — acetaminophen (TYLENOL) oral liquid: 1000 mg | ORAL | @ 12:00:00

## 2022-03-18 MED ADMIN — HYDROmorphone (PF) injection Syrg 0.5 mg: .5 mg | INTRAVENOUS | @ 19:00:00 | Stop: 2022-03-29

## 2022-03-18 MED ADMIN — polyethylene glycol (MIRALAX) packet 17 g: 17 g | ORAL | @ 14:00:00

## 2022-03-18 MED ADMIN — escitalopram oxalate (LEXAPRO) tablet 5 mg: 5 mg | ORAL | @ 19:00:00

## 2022-03-18 MED ADMIN — oxyCODONE (ROXICODONE) immediate release tablet 10 mg: 10 mg | ORAL | Stop: 2022-03-25

## 2022-03-18 MED ADMIN — OLANZapine zydis (ZyPREXA) disintegrating tablet 7.5 mg: 7.5 mg | ORAL | @ 02:00:00

## 2022-03-18 MED ADMIN — hydrOXYzine (ATARAX) oral syrup: 25 mg | ORAL | @ 02:00:00

## 2022-03-18 MED ADMIN — fat emulsion 20 % with fish oil (SMOFlipid) infusion 250 mL: 250 mL | INTRAVENOUS | @ 06:00:00 | Stop: 2022-03-18

## 2022-03-18 MED ADMIN — mirtazapine (REMERON) tablet 7.5 mg: 7.5 mg | ORAL | @ 02:00:00

## 2022-03-18 MED ADMIN — famotidine (PEPCID) tablet 20 mg: 20 mg | ORAL | @ 02:00:00

## 2022-03-18 MED ADMIN — ciprofloxacin HCl (CIPRO) tablet 500 mg: 500 mg | ORAL | @ 02:00:00 | Stop: 2022-04-08

## 2022-03-18 MED ADMIN — clonazePAM (KlonoPIN) disintegrating tablet 0.25 mg: .25 mg | ORAL | @ 23:00:00 | Stop: 2022-03-18

## 2022-03-18 MED ADMIN — Parenteral Nutrition (CENTRAL): INTRAVENOUS | @ 06:00:00 | Stop: 2022-03-19

## 2022-03-18 MED ADMIN — ciprofloxacin HCl (CIPRO) tablet 500 mg: 500 mg | ORAL | @ 14:00:00 | Stop: 2022-04-08

## 2022-03-18 MED ADMIN — oxyCODONE (ROXICODONE) immediate release tablet 10 mg: 10 mg | ORAL | @ 20:00:00 | Stop: 2022-03-25

## 2022-03-18 MED ADMIN — acetaminophen (TYLENOL) oral liquid: 1000 mg | ORAL | @ 19:00:00

## 2022-03-18 MED ADMIN — HYDROmorphone (PF) injection Syrg 0.5 mg: .5 mg | INTRAVENOUS | @ 06:00:00 | Stop: 2022-03-29

## 2022-03-18 MED ADMIN — gabapentin (NEURONTIN) capsule 200 mg: 200 mg | ORAL | @ 02:00:00

## 2022-03-18 MED ADMIN — acetaminophen (TYLENOL) oral liquid: 1000 mg | ORAL | @ 02:00:00

## 2022-03-18 MED ADMIN — HYDROmorphone (PF) injection Syrg 0.5 mg: .5 mg | INTRAVENOUS | @ 12:00:00 | Stop: 2022-03-29

## 2022-03-18 MED ADMIN — oxyCODONE (ROXICODONE) immediate release tablet 10 mg: 10 mg | ORAL | @ 14:00:00 | Stop: 2022-03-25

## 2022-03-18 MED ADMIN — ondansetron (ZOFRAN) injection 4 mg: 4 mg | INTRAVENOUS | @ 19:00:00 | Stop: 2022-03-18

## 2022-03-18 NOTE — Unmapped (Cosign Needed)
Internal Medicine (MEDW) Progress Note    Assessment & Plan:   [name] [name] is a 22 y.o. female whose presentation is complicated by [name] [name] Syndrome ([name]) s/p proctocolectomy w/ileoanal anastomosis, desmoid tumors (previously on sorafenib), anemia, on TPN, anxiety/nausea, recent admission for LGIB (anastomotic ulcer, anal fissures), presenting with BRBPR and recurrent abdominal pain.      Principal Problem:    GI bleeding  Active Problems:    [name]    [name]    Pain    Iron deficiency anemia due to chronic blood loss    Severe protein-calorie malnutrition (CMS-HCC)    Lower abdominal pain    Generalized anxiety disorder with panic attacks    MDD (major depressive disorder)    Small intestinal bacterial overgrowth (SIBO)    Ileus (CMS-HCC)  Resolved Problems:    * No resolved hospital problems. *    Active Problems    C/f Ileus vs Narcotic Bowel - Acute on Chronic Abdominal Pain - Desmoid Tumors   Ileus is resolved.  Pain  remains under poor control despite initiation of lidocaine infusion at 1 mg/kg, IV Dilaudid 0.5 mg x 4, and p.o. Oxy 10 mg x 3.  Tolerated removal of NG tube well and was able to take in a small amount of p.o. The dose of the lidocaine infusion was increased by the pain team as below.  Had an episode of increased pain and anxiety today with heart rates up to 155; EKG with sinus tach, received 0.75 of IV Dilaudid and IV Zofran to decent effect. Will stop her lipid infusion tomorrow as it may be blunting the analgesic effect of her infusion.   - Removed NGT  - Adv diet as tolerated (low residue)  - Stop lipids  - Continue daily MiraLax  - Continue Cipro 500mg  BID x14 days (EOT 1/16) [needs to be oral not IV]  - Pain Regimen:   - Increase Lidocaine infusion, 1.5 mg/kg/hr    - PRN IV Dilaudid q6h for now, attempt to wean off once pain well controlled with lidocaine infusion   - Start Dilaudid 3 mg oral tablet once nightly as needed    - Liquid Tylenol 1,000 mg q8hr  - Oxy 10mg  q6h PRN  - Gabapentin 200mg  at bedtime  - Baclofen 5mg  TID PRN   - Discontinue lidocaine patch    BRBPR I Acute on Chronic Anemia  Resolved. No BRBPR over the past 72 hours. Known ulcer at ileoanal anastomosis noted on pouchoscopy during last admission. Re-bleed could be a result of sorafenib resumption, of which she has stopped indefinitely.  GI w/ no indication for repeat endoscopic eval at this time. Follow up on 1/22 w/ [name] [name]. Hgb stable.  - continue trending Hgb daily   - Maintain active type and screen and adequate IV access with at least 2 large bore IV  - Transfuse Hgb <7    Severe Malnutrition 2/2 [name] and abdominal desmoid tumors   She is currently receiving TPN 5/7 days of the week and is being followed by [name] [name], outpatient GI  - TPN consult  - Low residue diet  - Stop lipids     [name]/Desmoid Tumors   Thoughtful note on 03/11/22 from Dr. [name]. Holding sorafenib for the foreseeable future. Looking into starting nirogacestat as no known bleeding risk. Believes that mesenteric lymph nodes are reactive in the setting of TPN, pouchitis/ulcer, malnutrition, and recurrent GIB. Their fertility counsellor is aware of the potential switch to nirogacestat  and will be reaching out to talk with pt and her mother.   - Hold sorafenib daily given bleeding     Anxiety - Panic Attack  Pt in talks with Adolescent Young Adult program at [facility]. Likely component of panic attack with increased pain as detailed in the first problem above. Starting Lexapro today per Psychiatry.    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 No bright red blood per rectum. NGT removed. Initatied on lidocaine infusion. Ate a piece of toast and drank chocolate milk. Pain remains under poor control despite IV pain meds. Denies n/v/d.    Episode today of increased pain and symptoms of panic in the setting of not being able to control her pain on current regimen.  Endorsed chest tightness and difficulty breathing.  Patient with heart rate in 150s and crying.  She received IV Dilaudid and Zofran to decent effect.    No acute events overnight.     Objective:   Temp:  [36.7 ??C (98.1 ??F)-37.1 ??C (98.8 ??F)] 36.7 ??C (98.1 ??F)  Heart Rate:  [79-94] 86  Resp:  [18-20] 20  BP: (112-126)/(59-71) 123/66  SpO2:  [97 %-100 %] 98 %    Gen: NAD, sitting upright in bed, arms wrapped around her stomach, NGT removed, crying  HENT: atraumatic, normocephalic  Heart: Tachycardic, regular rhythm  Lungs: CTAB anteriorly, no crackles or wheezes. No increased WOB on RA   Abdomen: soft, distention improved. Active bowel sounds. Moderately TTP    Gus Lockie Pares, MD   [facility] Internal Medicine PGY-1

## 2022-03-18 NOTE — Unmapped (Signed)
The [facility] does not have access to dispense OGSIVEO at this time, so the prescription will need to be rerouted to another pharmacy. Please follow up with [facility] in regards to further information pertaining to the pharmacy's access to this drug.

## 2022-03-18 NOTE — Unmapped (Signed)
Patient received in bed conscious aware and in no noted distress. NG tube in place but not on suction. TPN infusion with no reactions. Medicated as ordered. NG-tube removed per order and patient is for transfer to 8 B. Patient is to start Lidocaine infusion for pain. Patient is aware of transfer. Report called to receiving nurse and patient is being prepared to leave unit.Transport order in place.

## 2022-03-18 NOTE — Unmapped (Signed)
Adolescent and Young Adult [facility] Program Visit     Service date: March 18, 2022    Encounter location: Inpatient - Adult     Clinician: [name], LCSW - AYA Clinical Social Worker     Patient identifiers: [name] [name] is a 22 y.o. with desmoid tumors and [name]. [name] [name] uses she/her pronouns.      Visit Summary     Attempted to see [name] [name] for support. She was asleep with her mother at bedside. Her mom woke her up, but [name] [name] stated she's not feeling able to talk now and needed to sleep more. Her mom expressed concern to me about the challenges of the past several days, both here and in some things that have happened in Chrisma's life outside the hospital.    Appreciate psychiatry seeing [name] [name] and providing recommendations for ongoing management of worsening anxiety and depression.    Follow Up/Plan:     [name] [name] is open to me coming back tomorrow to see her.     I will continue to follow this patient for AYA-appropriate support. They have my contact information and have been encouraged to contact me as needed.       03/18/2022     [name], LCSW  Adolescent and Young Adult Clinical Social Worker  Phone: [phone]  Email: [email or web address]

## 2022-03-18 NOTE — Unmapped (Signed)
[facility]  Initial Psychiatry Consult Note     Service Date: March 18, 2022  LOS:  LOS: 6 days      Assessment:   [name] [name] is a 22 y.o. 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    Patient was seen in consultation by Psychiatry at the request of [name], MD with [facility]chevaria (MDW) for evaluation of  medication recommendations/starting an SSRI .     The patient's presentation, including presence of multiple worries that are difficult to control, sleep disturbance, fatigue/low energy, and impaired concentration appears to be most consistent with well-documented diagnosis of generalized anxiety disorder. Patient also has known Major Depressive Disorder characterized by reports of depressed mood, sleep disturbance, low energy, impaired concentration. Also consider adjustment disorder with depression due to acute severe medical conditions. There's also a concern that patient's poor nutrition status is contributing to her withdrawn state, low energy, and some other somatic concerns.     Per Chart Review, patient has been established with [facility] since 11/07/2021 and it has been documented that patient has not tolerated higher dose of Remeron beyond 7.5mg . [facility] psychiatry also started and titrated Olanzapine for anxiety, sleep, and appetite. Given severity of depression and anxiety symptoms at this time, (which include passive thoughts of death), and given risk of untreated depression, we discussed risks and benefits of initiation of SSRI to target mood symptoms.  While SSRI's can affect platelets, Lexapro has a comparatively lower risk of this compared to other SSRI's.  Additionally, the chemo agent thought to be contributing to melena has been stopped per discussions with outpatient psychiatrist. Patient provided informed consent to initiation of Lexapro  to target depression and anxiety symptoms.     Additionally, given some improvement noted with hydroxyzine last night, agree with hydroxyzine PRN anxiety as outlined below. While she notes some improvement with PRN Olanzapine, this is sedating for her and do not recommend further titration of Olanzapine at this time.     Please see below for detailed recommendations.    Diagnoses:   Active Hospital problems:  Principal Problem:    GI bleeding  Active Problems:    [name]    [name]    Pain    Iron deficiency anemia due to chronic blood loss    Severe protein-calorie malnutrition (CMS-HCC)    Lower abdominal pain    Generalized anxiety disorder with panic attacks    MDD (major depressive disorder)    Small intestinal bacterial overgrowth (SIBO)    Ileus (CMS-HCC)     Problems edited/added by me:  No problems updated.    Safety Risk Assessment:  There was a psychiatric evaluation with full risk assessment performed on 03/13/2022. At this time there is no change in risk, patient remains at low risk of suicide/ harm to self and at low risk of harm to others.     Recommendations:   ## Safety and Observation Level:   -- Based on the BSA or psychiatric evaluation, we estimate the patient to be at low risk for suicide in the current setting. We recommend routine level of observation on medical unit. This decision is based on my review of the  chart including patient's history and current presentation, interview of the patient, mental status examination, and consideration of suicide risk including evaluating suicidal ideation, plan, intent, suicidal or self-harm behaviors, risk factors, and protective factors. This judgment is based on our ability to directly address suicide risk, implement suicide prevention strategies and develop a safety plan while the patient is in the clinical setting.   -- If the patient attempts to leave against medical advice and it is felt to be unsafe for them to leave, please call a Behavioral Response and page Psychiatry at [phone].  -- Please contact our team if there is a concern that risk level has changed.    ## Medications:   -- START Lexapro 5 mg daily    -- We are OK with Atarax 25 mg q6H prn anxiety. Please add indication for anxiety.   -- CONTINUE Olanzapine 1.25 mg as needed daily for acute anxiety  -- CONTINUE home Olanzapine 7.5 mg at bedtime  -- CONTINUE home Mirtazapine 7.5 mg at bedtime  -- CONTINUE Gabapentin 200 mg nightly    ## Medical Decision Making Capacity:   -- A formal capacity assessement was not performed as a part of this evaluation.  If specific capacity questions arise, please contact our team as below.     ## Further Work-up:   -- Continue to work-up and treat possible medical conditions that may be contributing to current presentation.   -- While the patient is receiving medications (such as Olanzapine) that may prolong QTc and increase risk for torsades:     - MONITOR and KEEP Mg>2 and K>4      - MONITOR QTc regularly.  If QTc on tele strip >489ms, obtain 12-lead EKG.    ## Disposition:   -- When patient is discharged, please ensure that their AVS includes information about the 42 Suicide & Crisis Lifeline.  -- There are no psychiatric contraindications to discharging this patient when medically appropriate.  -- The patient will follow-up with [facility] psychiatry with Dr. Kathrine Haddock [facility] Psychiatry Fellow) for mental health care at the time of discharge.    ## Behavioral / Environmental:   -- Although not currently delirious, the patient is at an elevated risk for developing delirium. Please utilize delirium prevention protocol.    Thank you for this consult request. Recommendations have been communicated to the primary team.  We will follow as needed at this time. Please page [phone] for any questions or concerns.     Discussed with and seen by Fellow, Kathrine Haddock, MD.  Discussed with and seen by Attending, Maralyn Sago, MD, who agrees with the assessment and plan.    Lovett Calender, MD   PGY-3 Psychiatry     Interval History:   NGT removed, advancing diet as tolerated.     1/9- Hgb of 8.9, stable     PRN Usage:   Zyprexa 1.25 mg PO   - 1/07 at 1112   - 1/08 at 0819    Current Pain Regimen:    - Lidocaine infusion, 1 mg/kg/hr             - PRN IV Dilaudid q6h for now, attempt to wean off once pain well controlled with lidocaine infusion              - Start Dilaudid 3 mg oral tablet once nightly as needed    - Liquid Tylenol 1,000 mg q8hr  - Oxy 10mg  q6h PRN  - Gabapentin 200mg  at bedtime  -  Baclofen 5mg  TID PRN   - Discontinue lidocaine patch    Patient Report:   Patient says she is having a lot of anxiety, it comes in waves. Zyprexa has been helpful but she thinks she needs something else. PRN Zyprexa takes anxiety from 10/10 to 7/10. Reports she has been having some relationship issues with boyfriend, he hasn't come to see her in the hospital and this has been distressing for her.     Patient subsequently interviewed alone with mom out of the room. Patients endorses depression, has been having a very hard time, feels lonely. She endorses feeling like her life is falling apart. Patient wakes up not wanting to wake up again. When asked what she is looking forward to: nursing school, seeing friends, driving a car. 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 She was supposed to graduate in May and feels like she's falling behind. She feels like she can't get out of pain and back to ger life.  Friends have been very supportive, afraid she'll have to make all new friends since they've all graduated. She denies active SI, however sometimes wishes she did not have to wake up in the morning and face her suffering. Discussed risks and benefits of initiation of Lexapro for depression and she is interested in starting Lexapro for depression and GAD at this time. Patient says Atarax for anxiety has been helpful. Patient says primary real has no timeline yet for potential discharge. Patient reports a little pain drinking and eating. Was able to eat some cornflakes for breakfast.     Per mother at bedside at beginning of interview:  There has been a lot of outside factors impacting her anxiety-- hard to distinguish these from acute medical circumstances. Change in scenery has also been difficult since patient was moved to new room.     ROS:   All systems reviewed as negative/unremarkable aside from the following pertinent positives and negatives: except as above    Collateral information:   - Reviewed medical records in Epic  - Spoke to patient's mother see above interview where mom was present and actively participated during the interview    Psychiatric History:   Information collected from patient interview and collateral.  Prior psychiatric diagnoses: GAD, MDD  Psychiatric hospitalizations: none  Substance abuse treatment: none  Suicide attempts / Non-suicidal self-injury: none  Medication trials/compliance: Duloxetine (not helpful, worsening depression), Remeron, Olanzapine, Amitriptyline   Current OP psychiatric medication regimen: Remeron 7.5mg , Olanzapine 7.5mg   Current psychiatrist: Kathrine Haddock  Current therapist: [name]    Family History:   The patient's family history includes Alcohol abuse in her paternal grandfather; Arthritis in her maternal grandfather; Asthma in her maternal grandfather; COPD in her paternal grandmother; Cancer in her maternal grandmother; Diabetes in her maternal grandmother; Hypertension in her maternal grandmother; Miscarriages / [place] in her paternal grandmother; No Known Problems in her brother, father, maternal aunt, maternal uncle, mother, paternal aunt, paternal uncle, and sister; Other in her maternal grandmother; Stroke in her maternal grandmother; Thyroid disease in her maternal grandmother..  No family history of bipolar disorder or depression.     Medical History:  Past Medical History:   Diagnosis Date    Abdominal pain     Acid reflux     occas    Anesthesia complication     itching, shaking, coldness; last few surgeries have gone much better    Cancer (CMS-HCC)     Cataract of right eye  COVID-19 virus infection 01/2019    Cyst of thyroid determined by ultrasound     monitoring    [name]     2 right forearm, 1 left thigh, 1 right scapula, 1 under left clavicle; multiple    Difficult intravenous access     [name] (familial adenomatous polyposis)     [name]     Gastric polyps     History of chemotherapy     last treatment approx 05/2019    History of colon polyps     History of COVID-19 01/2019    Iron deficiency anemia due to chronic blood loss     received iron infusion 11-2019    PONV (postoperative nausea and vomiting)     Rectal bleeding     Syncopal episodes     especially if becoming dehydrated       Surgical History:  Past Surgical History:   Procedure Laterality Date    cyroablation      cystis removal      desmoid removal      PR CLOSE ENTEROSTOMY,RESEC+ANAST N/A 10/09/2020    Procedure: ILEOSTOMY TAKEDOWN;  Surgeon: Mickle Asper, MD;  Location: OR [place];  Service: General Surgery    PR COLONOSCOPY W/BIOPSY SINGLE/MULTIPLE N/A 10/27/2012    Procedure: COLONOSCOPY, FLEXIBLE, PROXIMAL TO SPLENIC FLEXURE; WITH BIOPSY, SINGLE OR MULTIPLE;  Surgeon: Shirlyn Goltz Mir, MD;  Location: PEDS PROCEDURE ROOM [place];  Service: Gastroenterology    PR COLONOSCOPY W/BIOPSY SINGLE/MULTIPLE N/A 09/14/2013    Procedure: COLONOSCOPY, FLEXIBLE, PROXIMAL TO SPLENIC FLEXURE; WITH BIOPSY, SINGLE OR MULTIPLE;  Surgeon: Shirlyn Goltz Mir, MD;  Location: PEDS PROCEDURE ROOM [place];  Service: Gastroenterology    PR COLONOSCOPY W/BIOPSY SINGLE/MULTIPLE N/A 11/08/2014 Procedure: COLONOSCOPY, FLEXIBLE, PROXIMAL TO SPLENIC FLEXURE; WITH BIOPSY, SINGLE OR MULTIPLE;  Surgeon: Arnold Long Mir, MD;  Location: PEDS PROCEDURE ROOM [facility];  Service: Gastroenterology    PR COLONOSCOPY W/BIOPSY SINGLE/MULTIPLE N/A 12/26/2015    Procedure: COLONOSCOPY, FLEXIBLE, PROXIMAL TO SPLENIC FLEXURE; WITH BIOPSY, SINGLE OR MULTIPLE;  Surgeon: Arnold Long Mir, MD;  Location: PEDS PROCEDURE ROOM [facility];  Service: Gastroenterology    PR COLONOSCOPY W/BIOPSY SINGLE/MULTIPLE N/A 09/02/2017    Procedure: COLONOSCOPY, FLEXIBLE, PROXIMAL TO SPLENIC FLEXURE; WITH BIOPSY, SINGLE OR MULTIPLE;  Surgeon: Arnold Long Mir, MD;  Location: PEDS PROCEDURE ROOM [place];  Service: Gastroenterology    PR COLSC FLX W/REMOVAL LESION BY HOT BX FORCEPS N/A 08/27/2016    Procedure: COLONOSCOPY, FLEXIBLE, PROXIMAL TO SPLENIC FLEXURE; W/REMOVAL TUMOR/POLYP/OTHER LESION, HOT BX FORCEP/CAUTE;  Surgeon: Arnold Long Mir, MD;  Location: PEDS PROCEDURE ROOM [facility];  Service: Gastroenterology    PR COLSC FLX W/RMVL OF TUMOR POLYP LESION SNARE TQ N/A 02/25/2019    Procedure: COLONOSCOPY FLEX; W/REMOV TUMOR/LES BY SNARE;  Surgeon: Helyn Numbers, MD;  Location: GI PROCEDURES [facility] [facility];  Service: Gastroenterology    PR COLSC FLX W/RMVL OF TUMOR POLYP LESION SNARE TQ N/A 03/13/2020    Procedure: COLONOSCOPY FLEX; W/REMOV TUMOR/LES BY SNARE;  Surgeon: Helyn Numbers, MD;  Location: [facility];  Service: Gastroenterology    PR EXC SKIN BENIG 2.1-3 CM TRUNK,ARM,LEG Right 02/25/2017    Procedure: EXCISION, BENIGN LESION INCLUDE MARGINS, EXCEPT SKIN TAG, LEGS; EXCISED DIAMETER 2.1 TO 3.0 CM;  Surgeon: Clarene Duke, MD;  Location: CHILDRENS OR [facility];  Service: Plastics    PR EXC SKIN BENIG 3.1-4 CM TRUNK,ARM,LEG Right 02/25/2017    Procedure: EXCISION, BENIGN LESION INCLUDE MARGINS, EXCEPT SKIN TAG, ARMS; EXCISED DIAMETER 3.1 TO  4.0 CM;  Surgeon: Clarene Duke, MD;  Location: CHILDRENS OR [facility];  Service: Plastics    PR EXC SKIN BENIG >4 CM FACE,FACIAL Right 02/25/2017    Procedure: EXCISION, OTHER BENIGN LES INCLUD MARGIN, FACE/EARS/EYELIDS/NOSE/LIPS/MUCOUS MEMBRANE; EXCISED DIAM >4.0 CM;  Surgeon: Clarene Duke, MD;  Location: CHILDRENS OR [facility];  Service: Plastics    PR EXC TUMOR SOFT TISSUE LEG/ANKLE SUBQ 3+CM Right 08/05/2019    Procedure: EXCISION, TUMOR, SOFT TISSUE OF LEG OR ANKLE AREA, SUBCUTANEOUS; 3 CM OR GREATER;  Surgeon: Arsenio Katz, MD;  Location: MAIN OR [place];  Service: Plastics    PR EXC TUMOR SOFT TISSUE LEG/ANKLE SUBQ <3CM Right 08/05/2019    Procedure: EXCISION, TUMOR, SOFT TISSUE OF LEG OR ANKLE AREA, SUBCUTANEOUS; LESS THAN 3 CM;  Surgeon: Arsenio Katz, MD;  Location: MAIN OR [facility];  Service: Plastics    PR LAP, SURG PROCTECTOMY W J-POUCH N/A 08/10/2020    Procedure: ROBOTIC ASSISTED LAPAROSCOPIC PROCTOCOLECTOMY, ILEAL J POUCH, WITH OSTOMY;  Surgeon: Mickle Asper, MD;  Location: OR [place];  Service: General Surgery    PR NDSC EVAL INTSTINAL POUCH DX W/COLLJ SPEC SPX N/A 01/23/2021    Procedure: ENDO EVAL SM INTEST POUCH; DX;  Surgeon: Modena Nunnery, MD;  [facility]: [facility] [facility];  Service: Gastroenterology    PR NDSC EVAL INTSTINAL POUCH DX W/COLLJ SPEC SPX N/A 08/27/2021    Procedure: ENDO EVAL SM INTEST POUCH; DX;  Surgeon: Hunt Oris, MD;  Location: [facility]URES [facility];  Service: Gastroenterology    PR NDSC EVAL INTSTINAL POUCH DX W/COLLJ SPEC SPX N/A 12/09/2021    Procedure: ENDO EVAL SM INTEST POUCH; DX;  Surgeon: Vidal Schwalbe, MD;  Location: GI PROCEDURES [facility];  Service: Gastroenterology    PR NDSC EVAL INTSTINAL POUCH W/BX SINGLE/MULTIPLE N/A 01/20/2022    Procedure: ENDOSCOPIC EVAL OF SMALL INTESTINAL POUCH; DIAGNOSTIC, No biopsies;  Surgeon: Andrey Farmer, MD;  Location: GI PROCEDURES [facility];  Service: Gastroenterology    PR NDSC EVAL INTSTINAL POUCH W/BX SINGLE/MULTIPLE N/A 02/13/2022    Procedure: ENDOSCOPIC EVAL OF SMALL INTESTINAL POUCH; DIAGNOSTIC, WITH BIOPSY;  Surgeon: Bronson Curb, MD;  Location: GI PROCEDURES [facility];  Service: Gastroenterology    PR UNLISTED PROCEDURE SMALL INTESTINE  01/23/2021    Procedure: UNLISTED PROCEDURE, SMALL INTESTINE;  Surgeon: Modena Nunnery, MD;  Location: [facility];  Service: Gastroenterology    PR UNLISTED PROCEDURE SMALL INTESTINE  02/13/2022    Procedure: UNLISTED PROCEDURE, SMALL INTESTINE;  Surgeon: Bronson Curb, MD;  Location: GI PROCEDURES [facility];  Service: Gastroenterology    PR UPPER GI ENDOSCOPY,BIOPSY N/A 10/27/2012    Procedure: UGI ENDOSCOPY; WITH BIOPSY, SINGLE OR MULTIPLE;  Surgeon: Shirlyn Goltz Mir, MD;  Location: PEDS PROCEDURE ROOM [facility];  Service: Gastroenterology    PR UPPER GI ENDOSCOPY,BIOPSY N/A 09/14/2013    Procedure: UGI ENDOSCOPY; WITH BIOPSY, SINGLE OR MULTIPLE;  Surgeon: Shirlyn Goltz Mir, MD;  Location: PEDS PROCEDURE ROOM [facility];  Service: Gastroenterology    PR UPPER GI ENDOSCOPY,BIOPSY N/A 11/08/2014    Procedure: UGI ENDOSCOPY; WITH BIOPSY, SINGLE OR MULTIPLE;  Surgeon: Arnold Long Mir, MD;  Location: PEDS PROCEDURE ROOM [facility];  Service: Gastroenterology    PR UPPER GI ENDOSCOPY,BIOPSY N/A 12/26/2015    Procedure: UGI ENDOSCOPY; WITH BIOPSY, SINGLE OR MULTIPLE;  Surgeon: Arnold Long Mir, MD;  Location: PEDS PROCEDURE ROOM [facility];  Service: Gastroenterology    PR UPPER GI ENDOSCOPY,BIOPSY N/A 08/27/2016  Procedure: UGI ENDOSCOPY; WITH BIOPSY, SINGLE OR MULTIPLE;  Surgeon: Arnold Long Mir, MD;  Location: PEDS PROCEDURE ROOM [facility];  Service: Gastroenterology    PR UPPER GI ENDOSCOPY,BIOPSY N/A 09/02/2017    Procedure: UGI ENDOSCOPY; WITH BIOPSY, SINGLE OR MULTIPLE;  Surgeon: Arnold Long Mir, MD;  Location: PEDS PROCEDURE ROOM [facility];  Service: Gastroenterology    PR UPPER GI ENDOSCOPY,BIOPSY N/A 03/13/2020    Procedure: UGI ENDOSCOPY; WITH BIOPSY, SINGLE OR MULTIPLE;  Surgeon: Helyn Numbers, MD;  Location: [facility] PROCEDURES [facility] [facility];  Service: Gastroenterology    PR UPPER GI ENDOSCOPY,BIOPSY N/A 09/05/2021    Procedure: UGI ENDOSCOPY; WITH BIOPSY, SINGLE OR MULTIPLE;  Surgeon: Wendall Papa, MD;  Location: GI PROCEDURES [facility];  Service: Gastroenterology    PR UPPER GI ENDOSCOPY,DIAGNOSIS N/A 01/20/2022    Procedure: UGI ENDO, INCLUDE ESOPHAGUS, STOMACH, & DUODENUM &/OR JEJUNUM; DX W/WO COLLECTION SPECIMN, BY BRUSH OR WASH;  Surgeon: Andrey Farmer, MD;  Location: GI PROCEDURES [facility];  Service: Gastroenterology    TUMOR REMOVAL      multiple-head, neck, back, hand, right flank, multiple       Medications:     Current Facility-Administered Medications:     acetaminophen (TYLENOL) oral liquid, 1,000 mg, Oral, Q8H, Muthukkumar, Rashmi, MD, 1,000 mg at 03/18/22 1610    baclofen (LIORESAL) tablet 5 mg, 5 mg, Oral, TID PRN, Linna Hoff, MD, 5 mg at 03/15/22 1728    ciprofloxacin HCl (CIPRO) tablet 500 mg, 500 mg, Oral, Q12H SCH, Durene Romans, MD, 500 mg at 03/18/22 0901    clobetasol (TEMOVATE) 0.05 % ointment, , Topical, BID PRN, Linna Hoff, MD    diclofenac sodium (VOLTAREN) 1 % gel 2 g, 2 g, Topical, QID, Linna Hoff, MD, 2 g at 03/17/22 0545    famotidine (PEPCID) tablet 20 mg, 20 mg, Oral, Nightly, Goldbeck, Lauren D, MD, 20 mg at 03/17/22 2106    fat emulsion 20 % (INTRAlipid) infusion 88.2 mL, 1.5 mL/kg, Intravenous, Once PRN, Siddiqui, Zia A, MD    fat emulsion 20 % (INTRAlipid) infusion, 0.25 mL/kg/min, Intravenous, Once PRN, Siddiqui, Zia A, MD    Parenteral Nutrition (CENTRAL), , Intravenous, Continuous, Last Rate: 100 mL/hr at 03/18/22 0049, New Bag at 03/18/22 0049 **AND** fat emulsion 20 % with fish oil (SMOFlipid) infusion 250 mL, 250 mL, Intravenous, Continuous, Contarino, Collie Siad, MD, Last Rate: 20.8 mL/hr at 03/18/22 0049, 250 mL at 03/18/22 0049    gabapentin (NEURONTIN) capsule 200 mg, 200 mg, Oral, Nightly, Linna Hoff, MD, 200 mg at 03/17/22 2106    HYDROmorphone (DILAUDID) tablet 3 mg, 3 mg, Oral, Nightly PRN, Cleotis Nipper, MD    HYDROmorphone (PF) injection Syrg 0.5 mg, 0.5 mg, Intravenous, Q6H PRN, Cleotis Nipper, MD, 0.5 mg at 03/18/22 0636    hydrOXYzine (ATARAX) tablet 25 mg, 25 mg, Oral, Q6H PRN, Cleotis Nipper, MD, 25 mg at 03/18/22 9604    hyoscyamine (LEVSIN) tablet 125 mcg, 125 mcg, Oral, Q4H PRN, Hollie Beach P, DO    lactated Ringers infusion, 10 mL/hr, Intravenous, Continuous, Siddiqui, Zia A, MD    lidocaine 2000 mg in dextrose 5 % 250 mL (8 mg/mL) infusion PMB, 1 mg/kg/hr, Intravenous, Continuous, Siddiqui, Zia A, MD, Last Rate: 7.4 mL/hr at 03/17/22 1848, 1 mg/kg/hr at 03/17/22 1848    melatonin tablet 3 mg, 3 mg, Oral, Nightly PRN, Linna Hoff, MD    mirtazapine (REMERON) tablet 7.5 mg, 7.5 mg, Oral, Nightly, Hollie Beach  P, DO, 7.5 mg at 03/17/22 2107    naloxone [facility]) injection 0.4 mg, 0.4 mg, Intravenous, Once PRN, Linna Hoff, MD    OLANZapine (ZyPREXA) tablet 1.25 mg, 1.25 mg, Oral, Daily PRN, Cleotis Nipper, MD, 1.25 mg at 03/17/22 0819    OLANZapine zydis (ZyPREXA) disintegrating tablet 7.5 mg, 7.5 mg, Oral, Nightly, Goldbeck, Lauren D, MD, 7.5 mg at 03/17/22 2107    ondansetron (ZOFRAN-ODT) disintegrating tablet 8 mg, 8 mg, Oral, Q12H PRN, Hollie Beach P, DO, 8 mg at 03/12/22 1610    oxyCODONE (ROXICODONE) immediate release tablet 10 mg, 10 mg, Oral, Q4H PRN, Achille Rich, MD, 10 mg at 03/18/22 0901    [START ON 03/19/2022] Parenteral Nutrition (CENTRAL), , Intravenous, Continuous **AND** [DISCONTINUED] fat emulsion 20 % with fish oil (SMOFlipid) infusion 250 mL, 250 mL, Intravenous, Continuous, Hendrick, Gustaf N, MD    phenol (CHLORASEPTIC) 1.4 % spray 2 spray, 2 spray, Mucous Membrane, Q2H PRN, Goldbeck, Lauren D, MD    polyethylene glycol (MIRALAX) packet 17 g, 17 g, Oral, Daily, Hendrick, Rushie Chestnut, MD, 17 g at 03/18/22 0901    simethicone (MYLICON) chewable tablet 80 mg, 80 mg, Oral, Q6H PRN, Achille Rich, MD, 80 mg at 03/14/22 1810    Allergies:  Allergies   Allergen Reactions    Adhesive Tape-Silicones Hives and Rash     Paper tape  And tegederm ok    Ferrlecit [Sodium Ferric Gluconat-Sucrose] Swelling and Rash    Levofloxacin Swelling and Rash     Swelling in mouth, rash,     Methylnaltrexone      Per Patient: I lost bowel control, severe abdominal cramping, and elevated BP    Neomycin Swelling     Rxn after ear drops; ear swelling    Papaya Hives    Morphine Nausea And Vomiting    Zosyn [Piperacillin-Tazobactam] Itching and Rash     Red and itchy    Compazine [Prochlorperazine] Other (See Comments)     Extreme agitation    Latex, Natural Rubber Rash       Social History:   Per previous records (as of 2023):  Socioeconomic History    Marital status: Single   Tobacco Use    Smoking status: Never       Passive exposure: Past    Smokeless tobacco: Never   Vaping Use    Vaping Use: Never used   Substance and Sexual Activity    Alcohol use: Never    Drug use: No    Sexual activity: Never   Other Topics Concern    Do you use sunscreen? Yes    Tanning bed use? No    Are you easily burned? No    Excessive sun exposure? No    Blistering sunburns? 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     Family History: Reviewed and updated  The patient's family history includes Alcohol abuse in her paternal grandfather; Arthritis in her maternal grandfather; Asthma in her maternal grandfather; COPD in her paternal grandmother; Cancer in her maternal grandmother; Diabetes in her maternal grandmother; Hypertension in her maternal grandmother; Miscarriages / Stillbirths in her paternal grandmother; No Known Problems in her brother, father, maternal aunt, maternal uncle, mother, paternal aunt, paternal uncle, and sister; Other in her maternal grandmother; Stroke in her maternal grandmother; Thyroid disease in her maternal grandmother.  Objective:   Vital signs:   Temp:  [36.7 ??C (98.1 ??F)-37.1 ??C (98.8 ??F)] 36.7 ??C (98.1 ??F)  Heart Rate:  [79-94] 92  Resp:  [18-20] 20  BP: (112-126)/(59-71) 123/66  MAP (mmHg):  [75-86] 82  SpO2:  [98 %-100 %] 98 %    Physical Exam:  Gen: No acute distress.  Pulm: Normal work of breathing.  Neuro/MSK: Normal bulk/tone. Gait/station deferred.  Skin: normal skin tone.    Mental Status Exam:  Appearance:  appears stated age and clean/Neat   Attitude:   calm, cooperative, and sad/tearful   Behavior/Psychomotor:  appropriate eye contact and no abnormal movements   Speech/Language:   normal rate, volume, tone, fluency and language intact, well formed   Mood:  ???a lot of anxiety???   Affect:  constricted, mood congruent, sad, and tearful   Thought process:  logical, linear, clear, coherent, goal directed   Thought content:    denies thoughts of self-harm. Denies SI, plans, or intent. Denies HI.  No grandiose, self-referential, persecutory, or paranoid delusions noted.   Perceptual disturbances:   denies auditory and visual hallucinations and behavior not concerning for response to internal stimuli   Attention:  able to fully attend without fluctuations in consciousness   Concentration:  Able to fully concentrate and attend   Orientation:  grossly oriented.   Memory:  not formally tested, but grossly intact   Fund of knowledge:   not formally assessed   Insight:    Fair   Judgment:   Fair   Impulse Control:  Fair       Data Reviewed:  I reviewed labs from the last 24 hours.     Additional Psychometric Testing:  Not applicable.    Consult Type and Time-Based Documentation:  This patient was evaluated in person.    Time-based billing disclaimer:  I personally spent 45   minutes face-to-face and non-face-to-face in the care of this patient, which includes all pre, intra, and post visit time on the date of service.  All documented time was specific to the E/M visit and does not include any procedures that may have been performed.

## 2022-03-18 NOTE — Unmapped (Signed)
Department of Anesthesiology  Pain Medicine Division    Chronic Pain Followup Inpatient Follow-Up Note    Requesting Attending Physician:  [name], MD  Service Requesting Consult:  Med General Welt (MDW)    Assessment/Recommendations:  22 y.o. 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 Follows with [name] at [facility].    Our service is re-engaging with Ms. Hansen's care as she has had an exacerbation in her abdominal pain after discontinuation of her hydromorphone IV on 1/5, the exacerbation of abdominal pain over the weekend was likely multifactorial in etiology - there were concerns of possible, transient SBO at a site of tethering in the small intestine on  CT and an ongoing, opioid-induced ileus. This exacerbation in pain necessitating reinitiation of IV hydromorphone.  She states that her pain is currently appropriately controlled at this time.  However, given concerns of her ongoing ileus and escalation of opioids; the primary team had wanted our team to reengage for assistance in reducing opioid requirements.    Her primary pain generators include acute abdominal pain from possible transient small bowel obstruction, chronic pain in the setting of multiple abdominal surgeries and desmoid tumors.    Interval: Patient states that she has not noticed much of a benefit with the lidocaine infusion for her pain; however, it has only been on for about 12 hours.  Upon review of  lidocaine infusion channel, the dose was set to 0.5 mg/kg/h instead of 1 mg/kg/h -the bedside nurse stated that she program was that of a milliliters per hour and it was at 7.4 mL/h.  She denies any adverse effects on the lidocaine infusion, states that she slept slightly better around 5 hours last night and had a piece of toast. She is a continues to have bowel movements that are watery.    She has a mild transaminitis and her EKG which resulted yesterday was normal sinus rhythm with an appropriate QTc.    Recommendations:  -The chronic pain service is a consult service and does not place orders, just makes recommendations (except ketamine and lidocaine infusions)   -Please evaluate all patients on opioids for appropriateness of prescribing narcan at discharge.  The chronic pain service can assist with this.  Nasal narcan is covered by most insurances.  -Recommendations given apply to the current hospitalization and do not reflect long term recommendations.    Changes to current analgesic regimen:  -Increase lidocaine infusion to 1.5 mg/kg/hr (see details below; our team will handle orders only for the lidocaine infusion)    -Patient is on a low dose lidocaine infusion for pain   -All lidocaine orders will be managed by the [facility] Chronic Pain Service only   -Lidocaine requires dedicated IV (PIV or central line lumen)   -Start lidocaine infusion at 1mg /kg/hr (ideal body weight)   -Patient location: dose can be increased no sooner than every 4 hours   -Day 1 (max-3 days). Started (date): 03/17/2022   -Prior to starting: Recent EKG, CMP (creatinine, LFTs), urine pregnancy test (if applicable)   -Daily CMP (creatinine and LFTs daily) while running   -Lipid emulsion available PRN: Local Anesthetic Systemic Toxicity   -Please contact the Chronic Pain service with any questions or concerns about lidocaine infusion.    Continue without changes:  -Continue hydromorphone 3 mg oral tablet once nightly as needed   -  Continue acetaminophen 1000 mg oral solution every 8 hours scheduled  -Continue diclofenac 1% gel topically 4 times a day scheduled  -Continue gabapentin 200 mg oral capsule nightly scheduled  -Continue baclofen 5 mg oral tablet 3 times a day as needed for muscle spasms  -Continue oxycodone 10 mg oral tablet every 4 hours as needed for moderate to severe pain  -Continue hydromorphone 0.5 mg IV every 6 hours as needed for refractory, breakthrough pain (goal of weaning to off over the course of the next few days as abdominal pain improves)  -Continue naloxone intranasal as needed in the event of opioid induced respiratory depression or oversedation    Home MME: 135  Current MME: 85    We will continue to follow.    Naloxone Rx at discharge?  Is patient on opioids? Yes.  1)Is dose >50MME?  Yes.  2) Is patient prescribed a benzodiazepine (w opioids)? No.  3)Hx of overdose?  No.  4) Hx of substance use disorder? No.  5) Opioids likely to last greater than a week after discharge? Yes.    If yes to 2 or more, prescribe naloxone at discharge.  Nasal narcan for most insured (Nasal narcan 4mg /actuation, prescribe 1 kit, instructions at [email or web address]).  For uninsured, chronic pain can work to assist in finding an option.  OTC nasal narcan now available at most pharmacies for around $45.    Interim History  There were no Acute Events Overnight.  The patient is obtaining adequate pain relief on current medication regimen and feels that their pain is Somewhat controlled    Analgesia Evaluation:  Pain at minimum: 6/10  Pain at maximum: 8/10    Current pain medication regimen (including how frequent PRN's were used):  IV HM 0.5 x 4  PO Oxy 10 x3      Inpatient Medications  Current Facility-Administered Medications   Medication Dose Route Frequency Provider Last Rate Last Admin    acetaminophen (TYLENOL) oral liquid  1,000 mg Oral Q8H Muthukkumar, Rashmi, MD   1,000 mg at 03/18/22 0639    baclofen (LIORESAL) tablet 5 mg  5 mg Oral TID PRN Linna Hoff, MD   5 mg at 03/15/22 1728    ciprofloxacin HCl (CIPRO) tablet 500 mg  500 mg Oral Q12H SCH Durene Romans, MD   500 mg at 03/18/22 0901    clobetasol (TEMOVATE) 0.05 % ointment   Topical BID PRN Linna Hoff, MD        diclofenac sodium (VOLTAREN) 1 % gel 2 g  2 g Topical QID Linna Hoff, MD   2 g at 03/17/22 0545    escitalopram oxalate (LEXAPRO) tablet 5 mg  5 mg Oral Daily Cleotis Nipper, MD        famotidine (PEPCID) tablet 20 mg  20 mg Oral Nightly Goldbeck, Lauren D, MD   20 mg at 03/17/22 2106    fat emulsion 20 % (INTRAlipid) infusion 88.2 mL  1.5 mL/kg Intravenous Once PRN Lavinia Mcneely A, MD        fat emulsion 20 % (INTRAlipid) infusion  0.25 mL/kg/min Intravenous Once PRN Betsey Amen, MD        Parenteral Nutrition (CENTRAL)   Intravenous Continuous Contarino, Collie Siad, MD 100 mL/hr at 03/18/22 0049 New Bag at 03/18/22 0049    And    fat emulsion 20 % with fish oil (SMOFlipid) infusion 250 mL  250 mL Intravenous Continuous Contarino, Collie Siad, MD 20.8 mL/hr at  03/18/22 0049 250 mL at 03/18/22 0049    gabapentin (NEURONTIN) capsule 200 mg  200 mg Oral Nightly Linna Hoff, MD   200 mg at 03/17/22 2106    HYDROmorphone (DILAUDID) tablet 3 mg  3 mg Oral Nightly PRN Cleotis Nipper, MD        HYDROmorphone (PF) injection Syrg 0.5 mg  0.5 mg Intravenous Q6H PRN Cleotis Nipper, MD   0.5 mg at 03/18/22 0636    hydrOXYzine (ATARAX) tablet 25 mg  25 mg Oral Q6H PRN Cleotis Nipper, MD        hyoscyamine (LEVSIN) tablet 125 mcg  125 mcg Oral Q4H PRN Hollie Beach P, DO        lactated Ringers infusion  10 mL/hr Intravenous Continuous Hamdi Vari A, MD        lidocaine 2000 mg in dextrose 5 % 250 mL (8 mg/mL) infusion PMB  1.5 mg/kg/hr Intravenous Continuous Laquitta Dominski A, MD 7.4 mL/hr at 03/17/22 1848 1 mg/kg/hr at 03/17/22 1848    melatonin tablet 3 mg  3 mg Oral Nightly PRN Linna Hoff, MD        mirtazapine (REMERON) tablet 7.5 mg  7.5 mg Oral Nightly Alan Ripper, DO   7.5 mg at 03/17/22 2107    naloxone [facility]) injection 0.4 mg  0.4 mg Intravenous Once PRN Linna Hoff, MD        OLANZapine (ZyPREXA) tablet 1.25 mg  1.25 mg Oral Daily PRN Cleotis Nipper, MD   1.25 mg at 03/17/22 0819    OLANZapine zydis (ZyPREXA) disintegrating tablet 7.5 mg  7.5 mg Oral Nightly Goldbeck, Lauren D, MD   7.5 mg at 03/17/22 2107    ondansetron (ZOFRAN-ODT) disintegrating tablet 8 mg  8 mg Oral Q12H PRN Hollie Beach P, DO   8 mg at 03/12/22 1610    oxyCODONE (ROXICODONE) immediate release tablet 10 mg  10 mg Oral Q4H PRN Achille Rich, MD   10 mg at 03/18/22 0901    [START ON 03/19/2022] Parenteral Nutrition (CENTRAL)   Intravenous Continuous Contarino, Collie Siad, MD        phenol (CHLORASEPTIC) 1.4 % spray 2 spray  2 spray Mucous Membrane Q2H PRN Goldbeck, Lauren D, MD        polyethylene glycol (MIRALAX) packet 17 g  17 g Oral Daily Cleotis Nipper, MD   17 g at 03/18/22 0901    simethicone (MYLICON) chewable tablet 80 mg  80 mg Oral Q6H PRN Achille Rich, MD   80 mg at 03/14/22 1810         Objective:     Vital Signs    Temp:  [36.7 ??C (98.1 ??F)-37.1 ??C (98.8 ??F)] 36.7 ??C (98.1 ??F)  Heart Rate:  [79-94] 92  Resp:  [18-20] 20  BP: (112-126)/(59-71) 123/66  MAP (mmHg):  [75-86] 82  SpO2:  [98 %-100 %] 98 %      Physical Exam    GENERAL:  Well developed, well-nourished female and is in mild distress.   HEAD/NECK:    Reveals normocephalic/atraumatic.   CARDIOVASCULAR:   RRR, no murmur  LUNGS:   Normal work of breathing, no supplemental 02  EXTREMITIES:  Warm, no clubbing, cyanosis, or edema was noted.  NEUROLOGIC:    The patient was alert and oriented times four with normal language, attention, cognition and memory. Cranial nerve exam was grossly normal.    MUSCULOSKELETAL:    Moving all extremities  SKIN:  No obvious rashes lesions or erythema  PSY:  Appropriate affect and mood.    Test Results    Lab Results   Component Value Date    CREATININE 0.45 (L) 03/17/2022     Lab Results   Component Value Date    ALKPHOS 44 (L) 03/17/2022    BILITOT 0.2 (L) 03/17/2022    BILIDIR <0.10 03/17/2022    PROT 6.2 03/17/2022    ALBUMIN 3.2 (L) 03/17/2022    ALT 67 (H) 03/17/2022    AST 38 (H) 03/17/2022           Problem List    Principal Problem:    GI bleeding  Active Problems:    [name]    [name]    Pain    Iron deficiency anemia due to chronic blood loss    Severe protein-calorie malnutrition (CMS-HCC)    Lower abdominal pain    Generalized anxiety disorder with panic attacks    MDD (major depressive disorder)    Small intestinal bacterial overgrowth (SIBO)    Ileus (CMS-HCC)

## 2022-03-18 NOTE — Unmapped (Signed)
Patient ambulatory and oriented x4. Mother at bedside.Tolerating lidocaine drip. Tolerating TPN and lipid. Medicated with pain reliever with fair effect for abdominal pain. Breathing normal easy and regular of room air. Slept fairly well. Call bell and phone kept in reach. Bed kept at lowest position and breaks kept locked. Will monitor.    Problem: Adult Inpatient Plan of Care  Goal: Plan of Care Review  Outcome: Progressing  Flowsheets (Taken 03/18/2022 0813)  Progress: improving  Plan of Care Reviewed With:   patient   parent  Goal: Patient-Specific Goal (Individualized)  Outcome: Progressing  Flowsheets (Taken 03/18/2022 1610)  Individualized Care Needs: No falls thru end of shift.  Anxieties, Fears or Concerns: Pain will be tolerable thru endof shift.  Goal: Absence of Hospital-Acquired Illness or Injury  Outcome: Progressing  Intervention: Identify and Manage Fall Risk  Recent Flowsheet Documentation  Taken 03/18/2022 0000 by Lennox Grumbles, RN  Safety Interventions:   fall reduction program maintained   family at bedside   environmental modification  Intervention: Prevent Skin Injury  Recent Flowsheet Documentation  Taken 03/18/2022 0400 by Lennox Grumbles, RN  Positioning for Skin: Supine/Back  Taken 03/18/2022 0200 by Lennox Grumbles, RN  Positioning for Skin: Supine/Back  Taken 03/18/2022 0000 by Lennox Grumbles, RN  Positioning for Skin: Supine/Back  Intervention: Prevent Infection  Recent Flowsheet Documentation  Taken 03/18/2022 0000 by Lennox Grumbles, RN  Infection Prevention:   environmental surveillance performed   hand hygiene promoted  Goal: Optimal Comfort and Wellbeing  Outcome: Progressing  Goal: Readiness for Transition of Care  Outcome: Progressing  Goal: Rounds/Family Conference  Outcome: Progressing     Problem: Latex Allergy  Goal: Absence of Allergy Symptoms  Outcome: Progressing     Problem: Gastrointestinal Bleeding  Goal: Optimal Coping with Acute Illness  Outcome: Progressing  Goal: Hemostasis  Outcome: Progressing     Problem: Pain Acute  Goal: Optimal Pain Control and Function  Outcome: Progressing

## 2022-03-19 LAB — COMPREHENSIVE METABOLIC PANEL
ALBUMIN: 3.3 g/dL — ABNORMAL LOW (ref 3.4–5.0)
ALKALINE PHOSPHATASE: 45 U/L — ABNORMAL LOW (ref 46–116)
ALT (SGPT): 59 U/L — ABNORMAL HIGH (ref 10–49)
ANION GAP: 4 mmol/L — ABNORMAL LOW (ref 5–14)
AST (SGOT): 29 U/L (ref <=34)
BILIRUBIN TOTAL: <0.2 mg/dL — ABNORMAL LOW (ref 0.3–1.2)
BLOOD UREA NITROGEN: 18 mg/dL (ref 9–23)
BUN / CREAT RATIO: 41
CALCIUM: 9.4 mg/dL (ref 8.7–10.4)
CHLORIDE: 110 mmol/L — ABNORMAL HIGH (ref 98–107)
CO2: 27 mmol/L (ref 20.0–31.0)
CREATININE: 0.44 mg/dL — ABNORMAL LOW
EGFR CKD-EPI (2021) FEMALE: >90 mL/min/{1.73_m2} (ref >=60)
GLUCOSE RANDOM: 92 mg/dL (ref 70–179)
POTASSIUM: 3.8 mmol/L (ref 3.4–4.8)
PROTEIN TOTAL: 6.2 g/dL (ref 5.7–8.2)
SODIUM: 141 mmol/L (ref 135–145)

## 2022-03-19 LAB — CBC W/ AUTO DIFF
BASOPHILS ABSOLUTE COUNT: 0 10*9/L (ref 0.0–0.1)
BASOPHILS RELATIVE PERCENT: 0.3 %
EOSINOPHILS ABSOLUTE COUNT: 0.1 10*9/L (ref 0.0–0.5)
EOSINOPHILS RELATIVE PERCENT: 1.8 %
HEMATOCRIT: 26.1 % — ABNORMAL LOW (ref 34.0–44.0)
HEMOGLOBIN: 8.5 g/dL — ABNORMAL LOW (ref 11.3–14.9)
LYMPHOCYTES ABSOLUTE COUNT: 1.9 10*9/L (ref 1.1–3.6)
LYMPHOCYTES RELATIVE PERCENT: 26.4 %
MEAN CORPUSCULAR HEMOGLOBIN CONC: 32.7 g/dL (ref 32.0–36.0)
MEAN CORPUSCULAR HEMOGLOBIN: 26.7 pg (ref 25.9–32.4)
MEAN CORPUSCULAR VOLUME: 81.7 fL (ref 77.6–95.7)
MEAN PLATELET VOLUME: 9.5 fL (ref 6.8–10.7)
MONOCYTES ABSOLUTE COUNT: 0.9 10*9/L — ABNORMAL HIGH (ref 0.3–0.8)
MONOCYTES RELATIVE PERCENT: 12.9 %
NEUTROPHILS ABSOLUTE COUNT: 4.2 10*9/L (ref 1.8–7.8)
NEUTROPHILS RELATIVE PERCENT: 58.6 %
PLATELET COUNT: 225 10*9/L (ref 150–450)
RED BLOOD CELL COUNT: 3.19 10*12/L — ABNORMAL LOW (ref 3.95–5.13)
RED CELL DISTRIBUTION WIDTH: 15.2 % (ref 12.2–15.2)
WBC ADJUSTED: 7.2 10*9/L (ref 3.6–11.2)

## 2022-03-19 LAB — MAGNESIUM: MAGNESIUM: 2.1 mg/dL (ref 1.6–2.6)

## 2022-03-19 LAB — BASIC METABOLIC PANEL
ANION GAP: 6 mmol/L (ref 5–14)
BLOOD UREA NITROGEN: 15 mg/dL (ref 9–23)
BUN / CREAT RATIO: 28
CALCIUM: 8.7 mg/dL (ref 8.7–10.4)
CHLORIDE: 109 mmol/L — ABNORMAL HIGH (ref 98–107)
CO2: 26 mmol/L (ref 20.0–31.0)
CREATININE: 0.53 mg/dL — ABNORMAL LOW
EGFR CKD-EPI (2021) FEMALE: >90 mL/min/{1.73_m2} (ref >=60)
GLUCOSE RANDOM: 105 mg/dL (ref 70–179)
POTASSIUM: 4.2 mmol/L (ref 3.4–4.8)
SODIUM: 141 mmol/L (ref 135–145)

## 2022-03-19 LAB — PHOSPHORUS: PHOSPHORUS: 3.9 mg/dL (ref 2.4–5.1)

## 2022-03-19 LAB — TRIGLYCERIDES: TRIGLYCERIDES: 50 mg/dL (ref 0–150)

## 2022-03-19 LAB — VITAMIN D 25 HYDROXY: VITAMIN D, TOTAL (25OH): 15.6 ng/mL — ABNORMAL LOW (ref 20.0–80.0)

## 2022-03-19 MED ADMIN — Parenteral Nutrition (CENTRAL): INTRAVENOUS | @ 05:00:00 | Stop: 2022-03-19

## 2022-03-19 MED ADMIN — polyethylene glycol (MIRALAX) packet 17 g: 17 g | ORAL | @ 14:00:00

## 2022-03-19 MED ADMIN — gabapentin (NEURONTIN) capsule 200 mg: 200 mg | ORAL | @ 03:00:00

## 2022-03-19 MED ADMIN — mirtazapine (REMERON) tablet 7.5 mg: 7.5 mg | ORAL | @ 03:00:00

## 2022-03-19 MED ADMIN — HYDROmorphone (PF) injection Syrg 0.5 mg: .5 mg | INTRAVENOUS | @ 08:00:00 | Stop: 2022-03-29

## 2022-03-19 MED ADMIN — HYDROmorphone (PF) injection Syrg 0.5 mg: .5 mg | INTRAVENOUS | @ 21:00:00 | Stop: 2022-03-29

## 2022-03-19 MED ADMIN — ciprofloxacin HCl (CIPRO) tablet 500 mg: 500 mg | ORAL | @ 14:00:00 | Stop: 2022-04-08

## 2022-03-19 MED ADMIN — hydrOXYzine (ATARAX) tablet 25 mg: 25 mg | ORAL | @ 14:00:00 | Stop: 2022-03-19

## 2022-03-19 MED ADMIN — famotidine (PEPCID) tablet 20 mg: 20 mg | ORAL | @ 03:00:00

## 2022-03-19 MED ADMIN — acetaminophen (TYLENOL) oral liquid: 1000 mg | ORAL | @ 11:00:00

## 2022-03-19 MED ADMIN — lidocaine 2000 mg in dextrose 5 % 250 mL (8 mg/mL) infusion PMB: 1.5 mg/kg/h | INTRAVENOUS | @ 05:00:00 | Stop: 2022-03-20

## 2022-03-19 MED ADMIN — oxyCODONE (ROXICODONE) immediate release tablet 10 mg: 10 mg | ORAL | @ 22:00:00 | Stop: 2022-03-25

## 2022-03-19 MED ADMIN — ciprofloxacin HCl (CIPRO) tablet 500 mg: 500 mg | ORAL | @ 03:00:00 | Stop: 2022-04-08

## 2022-03-19 MED ADMIN — acetaminophen (TYLENOL) oral liquid: 1000 mg | ORAL | @ 03:00:00

## 2022-03-19 MED ADMIN — HYDROmorphone (PF) injection Syrg 0.5 mg: .5 mg | INTRAVENOUS | @ 15:00:00 | Stop: 2022-03-29

## 2022-03-19 MED ADMIN — oxyCODONE (ROXICODONE) immediate release tablet 10 mg: 10 mg | ORAL | @ 17:00:00 | Stop: 2022-03-25

## 2022-03-19 MED ADMIN — OLANZapine zydis (ZyPREXA) disintegrating tablet 7.5 mg: 7.5 mg | ORAL | @ 03:00:00

## 2022-03-19 MED ADMIN — HYDROmorphone (PF) injection Syrg 0.5 mg: .5 mg | INTRAVENOUS | @ 02:00:00 | Stop: 2022-03-29

## 2022-03-19 MED ADMIN — diclofenac sodium (VOLTAREN) 1 % gel 2 g: 2 g | TOPICAL | @ 11:00:00

## 2022-03-19 MED ADMIN — escitalopram oxalate (LEXAPRO) tablet 5 mg: 5 mg | ORAL | @ 14:00:00

## 2022-03-19 MED ADMIN — acetaminophen (TYLENOL) oral liquid: 1000 mg | ORAL | @ 18:00:00

## 2022-03-19 MED ADMIN — baclofen (LIORESAL) tablet 5 mg: 5 mg | ORAL | @ 20:00:00

## 2022-03-19 MED ADMIN — oxyCODONE (ROXICODONE) immediate release tablet 10 mg: 10 mg | ORAL | @ 12:00:00 | Stop: 2022-03-25

## 2022-03-19 MED ADMIN — hydrOXYzine (ATARAX) tablet 25 mg: 25 mg | ORAL | @ 20:00:00 | Stop: 2022-03-19

## 2022-03-19 NOTE — Unmapped (Signed)
**  [facility] does have access to [place]. PA referral still pending outcome.

## 2022-03-19 NOTE — Unmapped (Signed)
VENOUS ACCESS TEAM PROCEDURE    Nurse request was placed for a PIV by Venous Access Team (VAT).  Patient was assessed at bedside for placement of a PIV. PPE were donned per protocol.  Access was obtained. Blood return noted.  Dressing intact and device well secured.  Flushed with normal saline.  See LDA for details.  Pt advised to inform RN of any s/s of discomfort at the PIV site.    Workup / Procedure Time:  30 minutes       RN was notified.       Thank you,     Ashley Akin, RN Venous Access Team

## 2022-03-19 NOTE — Unmapped (Addendum)
Department of Anesthesiology  Pain Medicine Division    Confidential Chronic Pain Consultation Psychology Note      Patient Name: [name] [name]  Medical Record Number: [id number]  Date of Service: March 19, 2022  Attending Psychologist: Ruffin Frederick, PhD  CPT Procedure Code: 28413 for initial 30 minutes Health & Behavior Intervention, [phone] (x2) for an additional 30 minutes of Health & Behavior Intervention    Requesting Attending Physician:  [name], MD  Service Requesting Consult:  Med General Welt (MDW)    Purpose of Consult: Interdisciplinary Pain Assessment and Recommendations; Case reviewed with Antony Blackbird, MD (Pain Medicine)    Subjective:   [name] is a 21 y.o.  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 Follows with [name] at [facility]. Pt first met with me on 12/13 during last admission.     Pt and mom were both present and involved in this discussion. They both noted that the pt has been more anxious during this admission due to psychosocial stressors and not being able to address them while in hospital. She feels more familiar with the pain and does feel that she is safe. She is worried about GI concerns, pain and not being able to eat. She believes lidocaine is providing more relief over time. She found benefit from psychiatry visit and has good rapport with social work.     She was prompted to identify evidence to support/challenge her thoughts. She reviewed some of the things that alleviated her distress and pain during last hospitalization; she and mom were encouraged to advocate for these resources during this hospitalization (e.g. massage, walking, MH services, etc.). Biofeedback introduced as a complement to breathing exercises. Writing may also help her cope with psychosocial stressors. Mom noted that pt benefits from a plan/structure, so will relay this to the team.      Objective / Mental Status Exam:  Appearance:    Appears stated age and in bed   Motor:   No abnormal movements   Speech/Language:    Normal rate, fluency, soft spoken, speaking with nursing team intermittently during the visit   Mood:   Depressed and Anxious   Affect:   Anxious, cautious   Thought process:   Logical, linear, clear, coherent, goal directed   Thought content:    Denies SI, HI, self harm, delusions, obsessions, paranoid ideation, or ideas of reference   Perceptual disturbances:    Denies auditory and visual hallucinations, behavior not concerning for response to internal stimuli     Orientation:  Oriented to person, place, time, and general circumstances; not formally assessed   Attention:   Able to attend without fluctuations in consciousness   Concentration:   Able to concentrate and attend   Memory:   Immediate, short-term, long-term, and recall grossly intact; not formally assessed    Fund of knowledge:    Consistent with level of education and development; not formally assessed   Insight:     Fair   Judgment:    Intact   Impulse Control:   Intact     Assessment:  [name] has a history of generalized anxiety disorder and chronic pain. She has experienced exacerbation of anxiety due to psychosocial stressors, repeat admissions and increase in pain. She does feel safe and pain is familiar. She and mom are aware of increased  anxiety and pain, and the bidirectional relationship between these symptoms. They responded well to introduction of CBT and use of evidence to challenge anxious thoughts. Biofeedback introduced and coping skills reviewed to help her cope with psychosocial stressors while in hospital. A more comprehensive approach to pain management encouraged as pt works to wean opiates.  Validation seems to alleviate some distress, improve confidence, and encourage openness around different strategies for pain relief. No safety concerns noted; should be monitored when new medications are initiated.       Current Problem List:  Principal Problem:    GI bleeding  Active Problems:    [name]    [name]    Pain    Iron deficiency anemia due to chronic blood loss    Severe protein-calorie malnutrition (CMS-HCC)    Lower abdominal pain    Generalized anxiety disorder with panic attacks    MDD (major depressive disorder)    Small intestinal bacterial overgrowth (SIBO)    Ileus (CMS-HCC)      Recommendations:  [name] may benefit from:      Biofeedback combined with diaphragmatic breathing; reviewed with pt and mom.     Continues with psychiatry and social work for additional support.     Writing to process thoughts and feelings.     Walk regularly. If pain is a barrier, recommend walking after taking pain medication and trying shorter distances, with greater frequency.     Massage, mindful coloring, pet therapy and social support are beneficial.     Pt can benefit from structured plans and concrete information to reduce anxious thinking patterns.     Ruffin Frederick, PhD  Pain Psychologist

## 2022-03-19 NOTE — Unmapped (Incomplete)
Pain Management Plan and Goals    Hospitalization:  -Adequately control pain with the goal of maximizing nonopioid medications in order to help with bowel motility  -Reduce IV hydromorphone requirements gradually while still maintaining good pain levels  -Prepare for an appropriate transition pain plan from the hospital to home as there is progress to discharge    Long-term goals:  -Find a regimen that is beneficial for you while decreasing opioid requirements  -Ideally Butrans and another medication for breakthrough pain

## 2022-03-19 NOTE — Unmapped (Signed)
Internal Medicine (MEDW) Progress Note    Assessment & Plan:   [name] [name] is a 22 y.o. female whose presentation is complicated by [name] [name] Syndrome ([name]) s/p proctocolectomy w/ileoanal anastomosis, desmoid tumors (previously on sorafenib), anemia, on TPN, anxiety/nausea, recent admission for LGIB (anastomotic ulcer, anal fissures), presenting with BRBPR and recurrent abdominal pain.      Principal Problem:    GI bleeding  Active Problems:    [name]    [name]    Pain    Iron deficiency anemia due to chronic blood loss    Severe protein-calorie malnutrition (CMS-HCC)    Lower abdominal pain    Generalized anxiety disorder with panic attacks    MDD (major depressive disorder)    Small intestinal bacterial overgrowth (SIBO)    Ileus (CMS-HCC)  Resolved Problems:    * No resolved hospital problems. *    Active Problems    Acute on Chronic Abdominal Pain - Desmoid Tumors - Ileus - Narcotic Bowel  Pain with slight improvement today. S/p 1.75 mg IV Dilaudid and 30 mg oral Oxy yesterday. Continues on Lidocaine infusion, increasing to 1.75 mg/kg/hr today. Seen by Pain Psychology and working on biofeedback mechanisms. Ongoing discussions surrounding Butrans patch - will revisit tomorrow.   - Low residue diet  - Continue daily MiraLax  - Pain Psychology following  - Continue Cipro 500mg  BID x14 days (EOT 1/16) [needs to be oral not IV]  - Pain Regimen:   - Increase Lidocaine infusion, 1.75 mg/kg/hr    - PRN IV Dilaudid q6h for now, attempt to wean once pain well controlled with lidocaine infusion   - Dilaudid 3 mg oral tablet once nightly as needed    - Liquid Tylenol 1,000 mg q8hr  - Oxy 10mg  q6h PRN  - Gabapentin 200mg  at bedtime  - Baclofen 5mg  TID PRN     BRBPR I Acute on Chronic Anemia  Resolved. Known ulcer at ileoanal anastomosis noted on pouchoscopy during last admission. Re-bleed could be a result of sorafenib resumption, of which she has stopped indefinitely.  GI w/ no indication for repeat endoscopic eval at this time. Follow up on 1/22 w/ Dr. [name]. Hgb stable.  - continue trending Hgb daily   - Maintain active type and screen and adequate IV access with at least 2 large bore IV  - Transfuse Hgb <7    Severe Malnutrition 2/2 [name] and abdominal desmoid tumors   She is currently receiving TPN 5/7 days of the week and is being followed by Dr. [name], outpatient GI  - TPN consult  - Low residue diet     Anxiety - Panic Attack  Pt in talks with [facility] program at [name]. Psychiatry following. Suspect episodes of pain and timing surrounding medications precipitate episodes of anxiety.   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 Pain under slight better control this AM. Not tolerating much by mouth. Working with Chronic Pain team and Pain Psychology team. Denies chest pain or SOB today.     No acute events overnight.     Objective:   Temp:  [36.5 ??C (97.7 ??F)-37.4 ??C (99.3 ??F)] (P) 36.6 ??C (97.9 ??F)  Heart Rate:  [79-115] (P) 94  Resp:  [18-24] (P) 18  BP: (106-143)/(54-75) (P) 121/65  SpO2:  [97 %-98 %] (P) 98 %    Gen: NAD, lying in bed, soft spoken  HENT: atraumatic, normocephalic  Heart: regular rate, regular rhythm  Lungs: CTAB anteriorly, no crackles or wheezes. No increased WOB on RA   Abdomen: soft, distention slightly improved. Active bowel sounds. mildly TTP    Gus Lockie Pares, MD   [facility] Internal Medicine PGY-1

## 2022-03-19 NOTE — Unmapped (Signed)
Department of Anesthesiology  Pain Medicine Division    Chronic Pain Followup Inpatient Follow-Up Note    Requesting Attending Physician:  [name], MD  Service Requesting Consult:  [facility] (MDW)    Assessment/Recommendations:  22 y.o. 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 Follows with [name] at [facility].    Our service is re-engaging with [name] [name]'s care as she has had an exacerbation in her abdominal pain after discontinuation of her hydromorphone IV on 1/5, the exacerbation of abdominal pain over the weekend was likely multifactorial in etiology - there were concerns of possible, transient SBO at a site of tethering in the small intestine on  CT and an ongoing, opioid-induced ileus. This exacerbation in pain necessitating reinitiation of IV hydromorphone.  She states that her pain is currently appropriately controlled at this time.  However, given concerns of her ongoing ileus and escalation of opioids; the primary team had wanted our team to reengage for assistance in reducing opioid requirements.    Her primary pain generators include acute abdominal pain from possible transient small bowel obstruction, chronic pain in the setting of multiple abdominal surgeries and desmoid tumors.    Interval: Concerns regarding increasing intermittent episodes of abdominal pain yesterday, this may have precipitated anxiety.  IV Dilaudid assisted with alleviating symptoms.  Primary team communicated concerns with our team, I recommended a one-time dose of 0.25 months of oral clonazepam in order to reduce anxiety and differentiate it from abdominal pain.  Dr. Lavonia Drafts -pain psychologist evaluated the patient this morning, encouraged her to advocate for herself, reinforced that reduction opioid requirements may decrease the frequency of hospital admissions, and introduced biofeedback mechanisms as a option in acute distress.  She also encouraged me to write a document outlining concrete, defined short-term and long-term goals and plans that may assist with reinforcement during stressful situations.    She is tolerating the lidocaine infusion without side effects and feels like it is possibly helping with her pain.  Dr. Vear Clock and I had a discussion regarding initiation of Butrans with concurrent opioid therapy, we reiterated that it would likely not cause her to experience withdrawals and that oxycodone or hydromorphone tablets could be available in addition to the Butrans in order to have an option for breakthrough opioid medication if needed.  Prior to discussing this with Dr. Vear Clock I had briefly mentioned the idea of Butrans initiation or Suboxone micro induction with the patient and her mother this morning.  Initiation of Butrans would be the goal as discussed in prior encounters with [name] who is her outpatient pain physician, this would likely lead to less reliance on full opioid agonists, lead to less fluctuations in poor analgesia, and may potentially reduce the frequency of hospitalizations due to decreased opioid requirements and improved pain control.    The patient was still apprehensive about initiating Butrans today, she stated that she would think about trialing this inpatient.    Recommendations:  -The chronic pain service is a consult service and does not place orders, just makes recommendations (except ketamine and lidocaine infusions)   -Please evaluate all patients on opioids for appropriateness of prescribing narcan at discharge.  The chronic pain service can assist with this.  Nasal narcan is covered by most insurances.  -Recommendations given apply to the current hospitalization and do not reflect long  term recommendations.    Changes to current analgesic regimen:  -Increase lidocaine infusion to 1.75 mg/kg/hr (see details below; our team will handle orders only for the lidocaine infusion)    -Patient is on a low dose lidocaine infusion for pain   -All lidocaine orders will be managed by the [facility] Chronic Pain Service only   -Lidocaine requires dedicated IV (PIV or central line lumen)   -Lidocaine infusion at 1.75 mg/kg/hr (ideal body weight)   -Patient location: dose can be increased no sooner than every 4 hours   -Day 1 (max-3 days). Started (date): 03/17/2022   -Prior to starting: Recent EKG, CMP (creatinine, LFTs), urine pregnancy test (if applicable)   -Daily CMP (creatinine and LFTs daily) while running   -Lipid emulsion available PRN: Local Anesthetic Systemic Toxicity   -Please contact the Chronic Pain service with any questions or concerns about lidocaine infusion.    Continue without changes:  -Continue hydromorphone 3 mg oral tablet once nightly as needed   -Continue acetaminophen 1000 mg oral solution every 8 hours scheduled  -Continue diclofenac 1% gel topically 4 times a day scheduled  -Continue gabapentin 200 mg oral capsule nightly scheduled  -Continue baclofen 5 mg oral tablet 3 times a day as needed for muscle spasms  -Continue oxycodone 10 mg oral tablet every 4 hours as needed for moderate to severe pain  -Continue hydromorphone 0.5 mg IV every 6 hours as needed for refractory, breakthrough pain (goal of weaning to off over the course of the next few days as abdominal pain improves)  -Continue naloxone intranasal as needed in the event of opioid induced respiratory depression or oversedation  -Continue engaging with pain psychology as needed (reinforced to patient that she will have to advocate for herself and let us know if she would like to set up an appointment)    Home MME: 135  Current MME: 85    We will continue to follow.    Naloxone Rx at discharge?  Is patient on opioids? Yes.  1)Is dose >50MME?  Yes.  2) Is patient prescribed a benzodiazepine (w opioids)? No.  3)Hx of overdose?  No.  4) Hx of substance use disorder? No.  5) Opioids likely to last greater than a week after discharge? Yes.    If yes to 2 or more, prescribe naloxone at discharge.  Nasal narcan for most insured (Nasal narcan 4mg /actuation, prescribe 1 kit, instructions at [email or web address]).  For uninsured, chronic pain can work to assist in finding an option.  OTC nasal narcan now available at most pharmacies for around $45.    Interim History  There were no Acute Events Overnight.  The patient is obtaining adequate pain relief on current medication regimen and feels that their pain is Somewhat controlled    Analgesia Evaluation:  Pain at minimum: 6/10  Pain at maximum: 8/10    Current pain medication regimen (including how frequent PRN's were used):  IV HM 0.5 x 4  PO Oxy 10 x3      Inpatient Medications  Current Facility-Administered Medications   Medication Dose Route Frequency Provider Last Rate Last Admin    acetaminophen (TYLENOL) oral liquid  1,000 mg Oral Q8H Muthukkumar, Rashmi, MD   1,000 mg at 03/19/22 0620    baclofen (LIORESAL) tablet 5 mg  5 mg Oral TID PRN Linna Hoff, MD   5 mg at 03/15/22 1728    ciprofloxacin HCl (CIPRO) tablet 500 mg  500 mg Oral Q12H [facility] Durene Romans,  MD   500 mg at 03/19/22 0913    clobetasol (TEMOVATE) 0.05 % ointment   Topical BID PRN Linna Hoff, MD        diclofenac sodium (VOLTAREN) 1 % gel 2 g  2 g Topical QID Linna Hoff, MD   2 g at 03/19/22 1610    escitalopram oxalate (LEXAPRO) tablet 5 mg  5 mg Oral Daily Cleotis Nipper, MD   5 mg at 03/19/22 0913    famotidine (PEPCID) tablet 20 mg  20 mg Oral Nightly Achille Rich, MD   20 mg at 03/18/22 2130    fat emulsion 20 % (INTRAlipid) infusion 88.2 mL  1.5 mL/kg Intravenous Once PRN Dyshaun Bonzo A, MD        fat emulsion 20 % (INTRAlipid) infusion  0.25 mL/kg/min Intravenous Once PRN Gennett Garcia A, MD        gabapentin (NEURONTIN) capsule 200 mg  200 mg Oral Nightly Linna Hoff, MD 200 mg at 03/18/22 2130    HYDROmorphone (DILAUDID) tablet 3 mg  3 mg Oral Nightly PRN Cleotis Nipper, MD        HYDROmorphone (PF) injection Syrg 0.5 mg  0.5 mg Intravenous Q6H PRN Cleotis Nipper, MD   0.5 mg at 03/19/22 0933    hydrOXYzine (ATARAX) tablet 25 mg  25 mg Oral Q6H PRN Cleotis Nipper, MD   25 mg at 03/19/22 9604    hyoscyamine (LEVSIN) tablet 125 mcg  125 mcg Oral Q4H PRN Hollie Beach P, DO        lactated Ringers infusion  10 mL/hr Intravenous Continuous Dinara Lupu A, MD        lidocaine 2000 mg in dextrose 5 % 250 mL (8 mg/mL) infusion PMB  1.75 mg/kg/hr Intravenous Continuous Aarib Pulido A, MD 11 mL/hr at 03/18/22 2335 1.5 mg/kg/hr at 03/18/22 2335    melatonin tablet 3 mg  3 mg Oral Nightly PRN Linna Hoff, MD        mirtazapine (REMERON) tablet 7.5 mg  7.5 mg Oral Nightly Alan Ripper, DO   7.5 mg at 03/18/22 2131    naloxone [facility]) injection 0.4 mg  0.4 mg Intravenous Once PRN Linna Hoff, MD        OLANZapine (ZyPREXA) tablet 1.25 mg  1.25 mg Oral Daily PRN Cleotis Nipper, MD   1.25 mg at 03/18/22 1524    OLANZapine zydis (ZyPREXA) disintegrating tablet 7.5 mg  7.5 mg Oral Nightly Goldbeck, Lauren D, MD   7.5 mg at 03/18/22 2130    ondansetron (ZOFRAN-ODT) disintegrating tablet 8 mg  8 mg Oral Q12H PRN Hollie Beach P, DO   8 mg at 03/12/22 5409    oxyCODONE (ROXICODONE) immediate release tablet 10 mg  10 mg Oral Q4H PRN Achille Rich, MD   10 mg at 03/19/22 1201    Parenteral Nutrition (CENTRAL)   Intravenous Continuous Contarino, Collie Siad, MD   Stopped at 03/19/22 1202    [START ON 03/20/2022] Parenteral Nutrition (CENTRAL)   Intravenous Continuous Contarino, Collie Siad, MD        phenol (CHLORASEPTIC) 1.4 % spray 2 spray  2 spray Mucous Membrane Q2H PRN Goldbeck, Lauren D, MD        polyethylene glycol (MIRALAX) packet 17 g  17 g Oral Daily Cleotis Nipper, MD   17 g at 03/19/22 0913    simethicone (MYLICON) chewable tablet 80 mg 80 mg Oral Q6H PRN  Achille Rich, MD   80 mg at 03/14/22 1810         Objective:     Vital Signs    Temp:  [36.5 ??C (97.7 ??F)-37.4 ??C (99.3 ??F)] 36.5 ??C (97.7 ??F)  Heart Rate:  [79-137] 101  Resp:  [18-24] 18  BP: (106-159)/(54-95) 128/73  MAP (mmHg):  [69-113] 89  SpO2:  [97 %-98 %] 98 %      Physical Exam    GENERAL:  Well developed, well-nourished female and is in mild distress.   HEAD/NECK:    Reveals normocephalic/atraumatic.   CARDIOVASCULAR:   RRR, no murmur  LUNGS:   Normal work of breathing, no supplemental 02  EXTREMITIES:  Warm, no clubbing, cyanosis, or edema was noted.  NEUROLOGIC:    The patient was alert and oriented times four with normal language, attention, cognition and memory. Cranial nerve exam was grossly normal.    MUSCULOSKELETAL:    Moving all extremities  SKIN:  No obvious rashes lesions or erythema  PSY:  Appropriate affect and mood.    Test Results    Lab Results   Component Value Date    CREATININE 0.53 (L) 03/19/2022     Lab Results   Component Value Date    ALKPHOS 45 (L) 03/18/2022    BILITOT <0.2 (L) 03/18/2022    BILIDIR <0.10 03/17/2022    PROT 6.2 03/18/2022    ALBUMIN 3.3 (L) 03/18/2022    ALT 59 (H) 03/18/2022    AST 29 03/18/2022           Problem List    Principal Problem:    GI bleeding  Active Problems:    [name]    [name]    Pain    Iron deficiency anemia due to chronic blood loss    Severe protein-calorie malnutrition (CMS-HCC)    Lower abdominal pain    Generalized anxiety disorder with panic attacks    MDD (major depressive disorder)    Small intestinal bacterial overgrowth (SIBO)    Ileus (CMS-HCC)

## 2022-03-19 NOTE — Unmapped (Signed)
Patient alert and oriented x 4 this shift.  Mom at bedside and supportive with care.  Patient on lidocaine infusion for Gardener's syndrome .  Patient request pain medications as needed this shift.  Patient met with pain team and lidocaine infusion increased.  Patient also met with psych and started new medication.  Patient reported unrelieved pain and  in afternoon.  Med team called and EKG completed and zofran given as well ad IV pain meds.  Anxiety managed with klonopin.  Patient resting at this time.    Problem: Adult Inpatient Plan of Care  Goal: Plan of Care Review  Outcome: Ongoing - Unchanged  Flowsheets (Taken 03/18/2022 1956)  Progress: no change  Plan of Care Reviewed With:   patient   parent  Goal: Patient-Specific Goal (Individualized)  Outcome: Ongoing - Unchanged  Goal: Absence of Hospital-Acquired Illness or Injury  Outcome: Ongoing - Unchanged  Intervention: Identify and Manage Fall Risk  Recent Flowsheet Documentation  Taken 03/18/2022 0800 by Holloway-Shambo, Gerhard Munch, RN  Safety Interventions: fall reduction program maintained  Intervention: Prevent Skin Injury  Recent Flowsheet Documentation  Taken 03/18/2022 0800 by Holloway-Shambo, Gerhard Munch, RN  Positioning for Skin: Supine/Back  Device Skin Pressure Protection: absorbent pad utilized/changed  Skin Protection: adhesive use limited  Intervention: Prevent and Manage VTE (Venous Thromboembolism) Risk  Recent Flowsheet Documentation  Taken 03/18/2022 1800 by Holloway-Shambo, Gerhard Munch, RN  Anti-Embolism Device Type: SCD, Knee  Anti-Embolism Intervention: Refused  Anti-Embolism Device Location: BLE  Taken 03/18/2022 1600 by Holloway-Shambo, Gerhard Munch, RN  Anti-Embolism Device Type: SCD, Knee  Anti-Embolism Intervention: Refused  Anti-Embolism Device Location: BLE  Taken 03/18/2022 1400 by Holloway-Shambo, Gerhard Munch, RN  Anti-Embolism Device Type: SCD, Knee  Anti-Embolism Intervention: Refused  Anti-Embolism Device Location: BLE  Taken 03/18/2022 1200 by Holloway-Shambo, Gerhard Munch, RN  Anti-Embolism Device Type: SCD, Knee  Anti-Embolism Intervention: Refused  Anti-Embolism Device Location: BLE  Taken 03/18/2022 1000 by Holloway-Shambo, Gerhard Munch, RN  Anti-Embolism Device Type: SCD, Knee  Anti-Embolism Intervention: Refused  Anti-Embolism Device Location: BLE  Taken 03/18/2022 0800 by Holloway-Shambo, Gerhard Munch, RN  VTE Prevention/Management: anticoagulant therapy  Anti-Embolism Device Type: SCD, Knee  Anti-Embolism Intervention: Refused  Anti-Embolism Device Location: BLE  Intervention: Prevent Infection  Recent Flowsheet Documentation  Taken 03/18/2022 0800 by Holloway-Shambo, Gerhard Munch, RN  Infection Prevention: environmental surveillance performed  Goal: Optimal Comfort and Wellbeing  Outcome: Ongoing - Unchanged  Goal: Readiness for Transition of Care  Outcome: Ongoing - Unchanged  Goal: Rounds/Family Conference  Outcome: Ongoing - Unchanged

## 2022-03-19 NOTE — Unmapped (Signed)
Pt alert and oriented x4. VSS on RA. Administered multiple PRNs for pain with some relief. Lidocaine and TPN infusing. Family member at bedside.  All safety interventions maintained throughout the shift.    Problem: Adult Inpatient Plan of Care  Goal: Plan of Care Review  Outcome: Not Progressing  Goal: Patient-Specific Goal (Individualized)  Outcome: Not Progressing  Goal: Absence of Hospital-Acquired Illness or Injury  Outcome: Not Progressing  Intervention: Identify and Manage Fall Risk  Recent Flowsheet Documentation  Taken 03/18/2022 2000 by Genevive Bi, RN  Safety Interventions:   environmental modification   fall reduction program maintained   lighting adjusted for tasks/safety   low bed   nonskid shoes/slippers when out of bed  Intervention: Prevent and Manage VTE (Venous Thromboembolism) Risk  Recent Flowsheet Documentation  Taken 03/18/2022 2000 by Genevive Bi, RN  Anti-Embolism Intervention: (Active bleeding) Other (Comment)  Intervention: Prevent Infection  Recent Flowsheet Documentation  Taken 03/18/2022 2000 by Genevive Bi, RN  Infection Prevention:   environmental surveillance performed   equipment surfaces disinfected   hand hygiene promoted   personal protective equipment utilized  Goal: Optimal Comfort and Wellbeing  Outcome: Not Progressing  Goal: Readiness for Transition of Care  Outcome: Not Progressing  Goal: Rounds/Family Conference  Outcome: Not Progressing

## 2022-03-20 LAB — CBC
HEMATOCRIT: 26.1 % — ABNORMAL LOW (ref 34.0–44.0)
HEMOGLOBIN: 8.6 g/dL — ABNORMAL LOW (ref 11.3–14.9)
MEAN CORPUSCULAR HEMOGLOBIN CONC: 32.9 g/dL (ref 32.0–36.0)
MEAN CORPUSCULAR HEMOGLOBIN: 27 pg (ref 25.9–32.4)
MEAN CORPUSCULAR VOLUME: 82.1 fL (ref 77.6–95.7)
MEAN PLATELET VOLUME: 9.4 fL (ref 6.8–10.7)
PLATELET COUNT: 223 10*9/L (ref 150–450)
RED BLOOD CELL COUNT: 3.17 10*12/L — ABNORMAL LOW (ref 3.95–5.13)
RED CELL DISTRIBUTION WIDTH: 15.2 % (ref 12.2–15.2)
WBC ADJUSTED: 6.6 10*9/L (ref 3.6–11.2)

## 2022-03-20 LAB — MAGNESIUM: MAGNESIUM: 2 mg/dL (ref 1.6–2.6)

## 2022-03-20 LAB — COMPREHENSIVE METABOLIC PANEL
ALBUMIN: 3.2 g/dL — ABNORMAL LOW (ref 3.4–5.0)
ALKALINE PHOSPHATASE: 42 U/L — ABNORMAL LOW (ref 46–116)
ALT (SGPT): 30 U/L (ref 10–49)
ANION GAP: 6 mmol/L (ref 5–14)
AST (SGOT): 16 U/L (ref <=34)
BILIRUBIN TOTAL: 0.2 mg/dL — ABNORMAL LOW (ref 0.3–1.2)
BLOOD UREA NITROGEN: 13 mg/dL (ref 9–23)
BUN / CREAT RATIO: 25
CALCIUM: 8.8 mg/dL (ref 8.7–10.4)
CHLORIDE: 109 mmol/L — ABNORMAL HIGH (ref 98–107)
CO2: 26 mmol/L (ref 20.0–31.0)
CREATININE: 0.53 mg/dL — ABNORMAL LOW
EGFR CKD-EPI (2021) FEMALE: >90 mL/min/{1.73_m2} (ref >=60)
GLUCOSE RANDOM: 115 mg/dL (ref 70–179)
POTASSIUM: 4 mmol/L (ref 3.4–4.8)
PROTEIN TOTAL: 6 g/dL (ref 5.7–8.2)
SODIUM: 141 mmol/L (ref 135–145)

## 2022-03-20 LAB — PHOSPHORUS: PHOSPHORUS: 3.2 mg/dL (ref 2.4–5.1)

## 2022-03-20 MED ADMIN — ciprofloxacin HCl (CIPRO) tablet 500 mg: 500 mg | ORAL | @ 14:00:00 | Stop: 2022-04-08

## 2022-03-20 MED ADMIN — polyethylene glycol (MIRALAX) packet 17 g: 17 g | ORAL | @ 14:00:00

## 2022-03-20 MED ADMIN — baclofen (LIORESAL) tablet 5 mg: 5 mg | ORAL | @ 14:00:00

## 2022-03-20 MED ADMIN — diclofenac sodium (VOLTAREN) 1 % gel 2 g: 2 g | TOPICAL | @ 16:00:00

## 2022-03-20 MED ADMIN — acetaminophen (TYLENOL) oral liquid: 1000 mg | ORAL | @ 18:00:00 | Stop: 2022-03-20

## 2022-03-20 MED ADMIN — ciprofloxacin HCl (CIPRO) tablet 500 mg: 500 mg | ORAL | @ 03:00:00 | Stop: 2022-04-08

## 2022-03-20 MED ADMIN — Parenteral Nutrition (CENTRAL): INTRAVENOUS | @ 06:00:00 | Stop: 2022-03-21

## 2022-03-20 MED ADMIN — diphenhydrAMINE (BENADRYL) injection: 25 mg | INTRAVENOUS | @ 05:00:00 | Stop: 2022-03-20

## 2022-03-20 MED ADMIN — famotidine (PEPCID) tablet 20 mg: 20 mg | ORAL | @ 03:00:00

## 2022-03-20 MED ADMIN — oxyCODONE (ROXICODONE) immediate release tablet 10 mg: 10 mg | ORAL | @ 14:00:00 | Stop: 2022-03-25

## 2022-03-20 MED ADMIN — cholecalciferol (vitamin D3 25 mcg (1,000 units)) tablet 25 mcg: 25 ug | ORAL | @ 15:00:00

## 2022-03-20 MED ADMIN — gabapentin (NEURONTIN) capsule 200 mg: 200 mg | ORAL | @ 03:00:00

## 2022-03-20 MED ADMIN — HYDROmorphone (PF) injection Syrg 0.5 mg: .5 mg | INTRAVENOUS | @ 22:00:00 | Stop: 2022-03-29

## 2022-03-20 MED ADMIN — escitalopram oxalate (LEXAPRO) tablet 5 mg: 5 mg | ORAL | @ 14:00:00

## 2022-03-20 MED ADMIN — hydrOXYzine (ATARAX) tablet 50 mg: 50 mg | ORAL | @ 23:00:00

## 2022-03-20 MED ADMIN — HYDROmorphone (PF) injection Syrg 0.5 mg: .5 mg | INTRAVENOUS | @ 03:00:00 | Stop: 2022-03-29

## 2022-03-20 MED ADMIN — lidocaine 2000 mg in dextrose 5 % 250 mL (8 mg/mL) infusion PMB: 1.75 mg/kg/h | INTRAVENOUS | @ 04:00:00 | Stop: 2022-03-20

## 2022-03-20 MED ADMIN — HYDROmorphone (DILAUDID) tablet 3 mg: 3 mg | ORAL | @ 05:00:00 | Stop: 2022-03-21

## 2022-03-20 MED ADMIN — OLANZapine zydis (ZyPREXA) disintegrating tablet 7.5 mg: 7.5 mg | ORAL | @ 03:00:00

## 2022-03-20 MED ADMIN — HYDROmorphone (PF) injection Syrg 0.5 mg: .5 mg | INTRAVENOUS | @ 16:00:00 | Stop: 2022-03-29

## 2022-03-20 MED ADMIN — acetaminophen (TYLENOL) oral liquid: 1000 mg | ORAL | @ 12:00:00 | Stop: 2022-03-20

## 2022-03-20 MED ADMIN — acetaminophen (TYLENOL) oral liquid: 1000 mg | ORAL | @ 03:00:00

## 2022-03-20 MED ADMIN — hydrOXYzine (ATARAX) tablet 25 mg: 25 mg | ORAL | @ 10:00:00 | Stop: 2022-03-20

## 2022-03-20 MED ADMIN — HYDROmorphone (PF) injection Syrg 0.5 mg: .5 mg | INTRAVENOUS | @ 10:00:00 | Stop: 2022-03-29

## 2022-03-20 MED ADMIN — buprenorphine 5 mcg/hour transdermal patch 1 patch: 1 | TRANSDERMAL | @ 15:00:00

## 2022-03-20 MED ADMIN — hydrOXYzine (ATARAX) tablet 25 mg: 25 mg | ORAL | @ 03:00:00 | Stop: 2022-03-19

## 2022-03-20 MED ADMIN — mirtazapine (REMERON) tablet 7.5 mg: 7.5 mg | ORAL | @ 03:00:00

## 2022-03-20 MED ADMIN — diphenhydrAMINE (BENADRYL) injection: 25 mg | INTRAVENOUS | @ 15:00:00 | Stop: 2022-03-20

## 2022-03-20 MED ADMIN — ondansetron (ZOFRAN-ODT) disintegrating tablet 8 mg: 8 mg | ORAL | @ 16:00:00

## 2022-03-20 MED ADMIN — oxyCODONE (ROXICODONE) immediate release tablet 10 mg: 10 mg | ORAL | @ 18:00:00 | Stop: 2022-03-25

## 2022-03-20 NOTE — Unmapped (Signed)
Internal Medicine (MEDW) Progress Note    Assessment & Plan:   [name] [name] is a 22 y.o. 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      Principal Problem:    GI bleeding  Active Problems:    [name]    [name]    Pain    Iron deficiency anemia due to chronic blood loss    Severe protein-calorie malnutrition (CMS-HCC)    Lower abdominal pain    Generalized anxiety disorder with panic attacks    MDD (major depressive disorder)    Small intestinal bacterial overgrowth (SIBO)    Ileus (CMS-HCC)  Resolved Problems:    * No resolved hospital problems. *    Active Problems    Acute on Chronic Abdominal Pain - Desmoid Tumors - Ileus (resolved) - Narcotic Bowel  Pain with slight improvement today. S/p 2 mg IV Dilaudid and 20 mg oral Oxy yesterday. Continues on Lidocaine infusion, plan to discontinue this evening. Starting Butrans patch today per Chronic Pain. Will start decreasing IV dilaudid on 1/12.  - Low residue diet  - Continue daily MiraLax  - Pain Psychology following  - Continue Cipro 500mg  BID x14 days (EOT 1/16) [needs to be oral not IV]  - Pain Regimen:   - Continue Lidocaine infusion, 1.75 mg/kg/hr; off tonight     - Start Butrans patch (Flonase admin to skin prior) q7 days   - PRN IV Dilaudid q6h for now, attempt to wean once pain well controlled with lidocaine infusion   - Dilaudid 3 mg oral tablet once nightly as needed    - Liquid Tylenol 1,000 mg q8hr  - Oxy 10mg  q6h PRN  - Gabapentin 200mg  at bedtime  - Baclofen 5mg  TID PRN     BRBPR I Acute on Chronic Anemia  Resolved. Known ulcer at ileoanal anastomosis noted on pouchoscopy during last admission. Re-bleed could be a result of sorafenib resumption, of which she has stopped indefinitely.  GI w/ no indication for repeat endoscopic eval at this time. Follow up on 1/22 w/ Dr. [name]. Hgb stable.  - continue trending Hgb daily   - Maintain active type and screen and adequate IV access with at least 2 large bore IV  - Transfuse Hgb <7    Severe Malnutrition 2/2 [name] and abdominal desmoid tumors   She is currently receiving TPN 5/7 days of the week and is being followed by Dr. [name], outpatient GI. Taking small amounts of oral intake.  - TPN consult  - Low residue diet     Anxiety - Panic Attack  Pt in talks with Adolescent Young Adult program at [facility]. Psychiatry following. Suspect episodes of pain and timing surrounding medications precipitate episodes of anxiety.   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 Reviewed spoke and wheel approach to multimodal pain management. IV benadryl was helpful for skin itching at prior IV.     No acute events overnight.     Objective:   Temp:  [36.5 ??C (97.7 ??F)-36.8 ??C (98.2 ??F)] 36.8 ??C (98.2 ??F)  Heart Rate:  [85-150] 85  Resp:  [18-19] 19  BP: (121-128)/(62-80) 123/62  SpO2:  [97 %-98 %] 97 %    Gen: NAD, lying in bed, soft spoken  HENT: atraumatic, normocephalic  Heart: regular rate, regular rhythm  Lungs: CTAB anteriorly, no crackles or wheezes. No increased WOB on RA   Abdomen: soft, distention stable. Active bowel sounds. mildly TTP    Gus Lockie Pares, MD   [facility] Internal Medicine PGY-1

## 2022-03-20 NOTE — Unmapped (Signed)
Pt a&o x4. VS within parameters on RA. Administered multiple PRNs with moderate relief. Lidocaine and TPN infusing. Family member at bedside. All safety interventions maintained throughout the shift.     Problem: Adult Inpatient Plan of Care  Goal: Plan of Care Review  Outcome: Ongoing - Unchanged  Goal: Patient-Specific Goal (Individualized)  Outcome: Ongoing - Unchanged  Goal: Absence of Hospital-Acquired Illness or Injury  Outcome: Ongoing - Unchanged  Intervention: Identify and Manage Fall Risk  Recent Flowsheet Documentation  Taken 03/19/2022 2000 by Genevive Bi, RN  Safety Interventions:   environmental modification   fall reduction program maintained   lighting adjusted for tasks/safety   low bed   nonskid shoes/slippers when out of bed  Intervention: Prevent Infection  Recent Flowsheet Documentation  Taken 03/19/2022 2000 by Genevive Bi, RN  Infection Prevention:   environmental surveillance performed   equipment surfaces disinfected   hand hygiene promoted   personal protective equipment utilized  Goal: Optimal Comfort and Wellbeing  Outcome: Ongoing - Unchanged  Goal: Readiness for Transition of Care  Outcome: Ongoing - Unchanged  Goal: Rounds/Family Conference  Outcome: Ongoing - Unchanged

## 2022-03-20 NOTE — Unmapped (Signed)
[facility]  Follow-Up Psychiatry Consult Note     Service Date: March 20, 2022  LOS:  LOS: 8 days      Assessment:   [name] [name] is a 22 y.o. 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    Patient was seen in consultation by Psychiatry at the request of [name], MD with [facility] (MDW) for evaluation of  medication recommendations/starting an SSRI .     The patient's presentation, including presence of multiple worries that are difficult to control, sleep disturbance, fatigue/low energy, and impaired concentration appears to be most consistent with well-documented diagnosis of generalized anxiety disorder. Patient also has known Major Depressive Disorder characterized by reports of depressed mood, sleep disturbance, low energy, impaired concentration. Also consider adjustment disorder with depression due to acute severe medical conditions. There's also a concern that patient's poor nutrition status is contributing to her withdrawn state, low energy, and some other somatic concerns. Per Chart Review, patient has been established with [facility] psychiatry since 11/07/2021 and it has been documented that patient has not tolerated higher dose of Remeron beyond 7.5mg . [facility] psychiatry also started and titrated Olanzapine for anxiety, sleep, and appetite.     As of 1/11, Patient has been started on Lexapro given severity and chronicity of anxiety and depressive symptoms. She is tolerating medication well without side effects, therefore plan to continue patient on Lexapro and continue to monitor side effects. Additionally, patient has endorsed improvement in anxiety  (9/10--> 6/10) with prn Hydroxizine initiation, and has vocalized that she would like further control of symptoms. Given that patient is currently taking lowest dose of medication, and has reported good efficacy of medication in the past, plan to increase dose of Hydroxyzine PRN as outlined below for further control of patient's anxiety. We also discussed option of increasing scheduled Gabapentin for anxiety symptoms, which she has deferred at time. Patient working with pain management and primary care surrounding discussion of Butrans patch. Suspect that anxiety will improve as pain in treated.     Please see below for detailed recommendations.    Diagnoses:   Active Hospital problems:  Principal Problem:    GI bleeding  Active Problems:    [name]    [name]    Pain    Iron deficiency anemia due to chronic blood loss    Severe protein-calorie malnutrition (CMS-HCC)    Lower abdominal pain    Generalized anxiety disorder with panic attacks    MDD (major depressive disorder)    Small intestinal bacterial overgrowth (SIBO)    Ileus (CMS-HCC)     Problems edited/added by me:  No problems updated.    Safety Risk Assessment:  There was a psychiatric evaluation with full risk assessment performed on 03/13/2022. At this time there is no change in risk, patient remains at low risk of suicide/ harm to self and at low risk of harm to others.     Recommendations:   ## Safety and Observation Level:   -- Based on the BSA or psychiatric evaluation, we estimate the patient to be at low risk for suicide in the current setting. We recommend routine level of observation on medical unit. This decision  is based on my review of the chart including patient's history and current presentation, interview of the patient, mental status examination, and consideration of suicide risk including evaluating suicidal ideation, plan, intent, suicidal or self-harm behaviors, risk factors, and protective factors. This judgment is based on our ability to directly address suicide risk, implement suicide prevention strategies and develop a safety plan while the patient is in the clinical setting.   -- If the patient attempts to leave against medical advice and it is felt to be unsafe for them to leave, please call a Behavioral Response and page Psychiatry at [phone].  -- Please contact our team if there is a concern that risk level has changed.    ## Medications:   -- INCREASE Atarax from 25 mg q6H to 50 mg q6H prn anxiety   -- CONTINUE Lexapro 5 mg daily    -- CONTINUE Olanzapine 1.25 mg as needed daily for agitation   -- CONTINUE home Olanzapine 7.5 mg at bedtime  -- CONTINUE home Mirtazapine 7.5 mg at bedtime  -- CONTINUE Gabapentin 200 mg nightly      ## Medical Decision Making Capacity:   -- A formal capacity assessement was not performed as a part of this evaluation.  If specific capacity questions arise, please contact our team as below.     ## Further Work-up:   -- Continue to work-up and treat possible medical conditions that may be contributing to current presentation.   -- While the patient is receiving medications (such as Olanzapine) that may prolong QTc and increase risk for torsades:     - MONITOR and KEEP Mg>2 and K>4      - MONITOR QTc regularly.  If QTc on tele strip >431ms, obtain 12-lead EKG.    ## Disposition:   -- When patient is discharged, please ensure that their AVS includes information about the 32 Suicide & Crisis Lifeline.  -- There are no psychiatric contraindications to discharging this patient when medically appropriate.  -- The patient will follow-up with [facility] psychiatry with Dr. Kathrine Haddock [facility] Psychiatry Fellow) for mental health care at the time of discharge.    ## Behavioral / Environmental:   -- Although not currently delirious, the patient is at an elevated risk for developing delirium. Please utilize delirium prevention protocol.    Thank you for this consult request. Recommendations have been communicated to the primary team.  We will follow as needed at this time. Please page [phone] for any questions or concerns.     Discussed with and seen by Fellow, Kathrine Haddock, MD.  Discussed with and seen by Attending, Maralyn Sago, MD, who agrees with the assessment and plan.    Lovett Calender, MD   PGY-3 Psychiatry     Interval History:   Relevant events since last seen by psychiatry  Patient was seen by Pain Psychiatry, discussion biofeedback diaphragmatic breathing. Ongoing discussion with pain management about initiation of Butrans patch. 1/10 Hg 8.6,  stable     PRN Usage:     Hydroxyzine tablet 25 mg  -1/11 x1   -1/10 x3    Olanzapine tablet 1.25 mg   -not given yesterday      Pain Management:     - Lidocaine infusion, 1.75 mg/kg/hr    - PRN IV Dilaudid q6h for now, attempt to wean once pain well controlled withlidocaine infusion  - Buprenorphine 5 mcg/hour transdermal patch 1 patch               -  Dilaudid 3 mg oral tablet once nightly as needed    - Liquid Tylenol 1,000 mg q8hr  - Oxy 10mg  q6h PRN  - Gabapentin 200mg  at bedtime  - Baclofen 5mg  TID PRN     Patient Interview:     Patient denies any side effects from Lexapro so far. Reports decreased anxiety from 9->6 with atarax, would like to have dose increased for better control of symptoms. Reports that Atrarax has been very helpful in the past for her anxiety, especially at a higher dose. Did not take Zyprexa yesterday, reports she has not been as drowsy. States that she's not interested in increasing Gabapentin dose for her anxiety. Mood is upset with her pain concerns. No thoughts of harm to self or suicide.  Identifies current location, city, state, year. Correctly count backwards from 42-27. Correctly states MOYB. Denies SI.    Collateral information:   - Reviewed medical records in Epic  - Spoke to patient's mother see above interview where mom was present and actively participated during the interview      ROS:   All systems reviewed as negative/unremarkable aside from the following pertinent positives and negatives: abdominal pain    Collateral:   - Reviewed medical records in Epic    Relevant Updates to past psychiatric, medical/surgical, family, or social history: none    Current Medications:  Scheduled Meds:  ??? acetaminophen  1,000 mg Oral Q8H   ??? buprenorphine  1 patch Transdermal Q7 Days   ??? cholecalciferol (vitamin D3-10 mcg (400 unit))  25 mcg Oral Daily   ??? ciprofloxacin HCl  500 mg Oral Q12H SCH   ??? diclofenac sodium  2 g Topical QID   ??? escitalopram oxalate  5 mg Oral Daily   ??? famotidine  20 mg Oral Nightly   ??? gabapentin  200 mg Oral Nightly   ??? mirtazapine  7.5 mg Oral Nightly   ??? OLANZapine zydis  7.5 mg Oral Nightly   ??? polyethylene glycol  17 g Oral Daily     Continuous Infusions:  ??? lactated Ringers     ??? lidocaine cardiac 1.75 mg/kg/hr (03/19/22 2254)   ??? Parenteral Nutrition (CENTRAL) 100 mL/hr at 03/20/22 0030   ??? [START ON 03/21/2022] Parenteral Nutrition (CENTRAL)       PRN Meds:.baclofen, clobetasol, fat emulsion 20 %, fat emulsion 20 %, HYDROmorphone, HYDROmorphone, hydrOXYzine, hyoscyamine, melatonin, naloxone, OLANZapine, ondansetron, oxyCODONE, phenol, simethicone     Objective:   Vital signs:   Temp:  [36.6 ??C (97.9 ??F)-36.8 ??C (98.2 ??F)] 36.8 ??C (98.2 ??F)  Heart Rate:  [85-150] 85  Resp:  [18-19] 19  BP: (121-127)/(62-80) 123/62  MAP (mmHg):  [78-95] 78  SpO2:  [97 %-98 %] 97 %    Physical Exam:  Gen: No acute distress.  Pulm: Normal work of breathing.  Neuro/MSK: Normal bulk/tone. Gait/station deferred.  Skin: normal skin tone.    Mental Status Exam:  Appearance:  appears stated age and clean/Neat   Attitude:   calm, cooperative and sad   Behavior/Psychomotor:  appropriate eye contact and no abnormal movements   Speech/Language:   normal rate, volume, tone, fluency and language intact, well formed   Mood:  ???upset???   Affect:  constricted, mood congruent, sad and guarded Thought process:  logical, linear, clear, coherent, goal directed   Thought content:    denies thoughts of self-harm. Denies SI, plans, or intent. Denies HI.  No grandiose, self-referential, persecutory, or paranoid delusions noted.   Perceptual disturbances:  denies auditory and visual hallucinations and behavior not concerning for response to internal stimuli   Attention:   Able to complete months of the year backwards and count backwards from 42 to 27   Concentration:  Able to fully concentrate and attend   Orientation:  Oriented to person, place, city, date, month, year and situation.   Memory:  not formally tested, but grossly intact   Fund of knowledge:   not formally assessed   Insight:    Fair   Judgment:   Fair   Impulse Control:  Fair       Data Reviewed:  I reviewed labs from the last 24 hours.     Additional Psychometric Testing:  Not applicable.    Consult Type and Time-Based Documentation:  This patient was evaluated in person.    Time-based billing disclaimer:

## 2022-03-20 NOTE — Unmapped (Cosign Needed)
Department of Anesthesiology  Pain Medicine Division    Chronic Pain Followup Inpatient Follow-Up Note    Requesting Attending Physician:  [name], MD  Service Requesting Consult:  [facility] (MDW)    Assessment/Recommendations:  22 y.o. female  with [name] (familial adenomatosis polyposis) s/p proctocolectomy with ileoanal anastomosis, desmoid tumors, anemia, severe protein calorie malnutrition that presented to [facility] with GI bleed and anemia. Follows with [name] at [facility].    Our service is re-engaging with Ms. Davitt's care as she has had an exacerbation in her abdominal pain after discontinuation of her hydromorphone IV on 1/5, the exacerbation of abdominal pain over the weekend was likely multifactorial in etiology - there were concerns of possible, transient SBO at a site of tethering in the small intestine on  CT and an ongoing, opioid-induced ileus. This exacerbation in pain necessitating reinitiation of IV hydromorphone.  She states that her pain is currently appropriately controlled at this time.  However, given concerns of her ongoing ileus and escalation of opioids; the primary team had wanted our team to reengage for assistance in reducing opioid requirements.    Her primary pain generators include acute abdominal pain from possible transient small bowel obstruction, chronic pain in the setting of multiple abdominal surgeries and desmoid tumors.    Interval: Pain exacerbated over night. Patient was nervous but open to trialing butrans patch with flonase applied to are prior to application. Her main concern was withdrawal, we discussed the supplementation with full opioid agonists and initiation of butrans at a low dose would likely not lead to withdrawal.     Recommendations:  -The chronic pain service is a consult service and does not place orders, just makes recommendations (except ketamine and lidocaine infusions)   -Please evaluate all patients on opioids for appropriateness of prescribing narcan at discharge.  The chronic pain service can assist with this.  Nasal narcan is covered by most insurances.  -Recommendations given apply to the current hospitalization and do not reflect long term recommendations.    Changes to current analgesic regimen:  -Lidocaine infusion off this evening  -Start Butrans 5 mcg/hr patch every 7 days scheduled (please spray flonase onto site of patch application in order to prevent hypersensitivity that is adhesive-related; please ensure that patch alternates sites)   -Discussed decreasing IV hydromorphone gradually starting 1/12    -Patient is on a low dose lidocaine infusion for pain   -All lidocaine orders will be managed by the [facility] Chronic Pain Service only   -Lidocaine requires dedicated IV (PIV or central line lumen)   -Lidocaine infusion at 1.75 mg/kg/hr (ideal body weight)   -Patient location: dose can be increased no sooner than every 4 hours   -Day 3 (max-3 days). Started (date): 03/17/2022   -Prior to starting: Recent EKG, CMP (creatinine, LFTs), urine pregnancy test (if applicable)   -Daily CMP (creatinine and LFTs daily) while running   -Lipid emulsion available PRN: Local Anesthetic Systemic Toxicity   -Please contact the Chronic Pain service with any questions or concerns about lidocaine infusion.    Continue without changes:  -Continue hydromorphone 3 mg oral tablet once nightly as needed   -Continue acetaminophen 1000 mg oral solution every 8 hours scheduled  -Continue diclofenac 1% gel topically 4 times a day scheduled  -Continue gabapentin 200 mg oral capsule nightly scheduled  -Continue baclofen 5 mg oral tablet 3 times a day as needed for muscle spasms  -Continue oxycodone 10 mg  oral tablet every 4 hours as needed for moderate to severe pain  -Continue hydromorphone 0.5 mg IV every 6 hours as needed for refractory, breakthrough pain (goal of weaning to off over the course of the next few days as abdominal pain improves)  -Continue naloxone intranasal as needed in the event of opioid induced respiratory depression or oversedation  -Continue engaging with pain psychology as needed (reinforced to patient that she will have to advocate for herself and let us know if she would like to set up an appointment)    Home MME: 135  Current MME: 85    We will continue to follow.    Naloxone Rx at discharge?  Is patient on opioids? Yes.  1)Is dose >50MME?  Yes.  2) Is patient prescribed a benzodiazepine (w opioids)? No.  3)Hx of overdose?  No.  4) Hx of substance use disorder? No.  5) Opioids likely to last greater than a week after discharge? Yes.    If yes to 2 or more, prescribe naloxone at discharge.  Nasal narcan for most insured (Nasal narcan 4mg /actuation, prescribe 1 kit, instructions at [email or web address]).  For uninsured, chronic pain can work to assist in finding an option.  OTC nasal narcan now available at most pharmacies for around $45.    Interim History  There were no Acute Events Overnight.  The patient is obtaining adequate pain relief on current medication regimen and feels that their pain is Somewhat controlled    Analgesia Evaluation:  Pain at minimum: 6/10  Pain at maximum: 8/10    Current pain medication regimen (including how frequent PRN's were used):  IV HM 0.5 x 4  PO HM 3 x 1  PO Oxy 10 x2      Inpatient Medications  Current Facility-Administered Medications   Medication Dose Route Frequency Provider Last Rate Last Admin    acetaminophen (TYLENOL) oral liquid  1,000 mg Oral Q8H Muthukkumar, Rashmi, MD   1,000 mg at 03/20/22 0630    baclofen (LIORESAL) tablet 5 mg  5 mg Oral TID PRN Linna Hoff, MD   5 mg at 03/20/22 1610    buprenorphine 5 mcg/hour transdermal patch 1 patch  1 patch Transdermal Q7 Days Muthukkumar, Rashmi, MD   1 patch at 03/20/22 0940    cholecalciferol (vitamin D3 25 mcg (1,000 units)) tablet 25 mcg  25 mcg Oral Daily Cleotis Nipper, MD   25 mcg at 03/20/22 0940    ciprofloxacin HCl (CIPRO) tablet 500 mg  500 mg Oral Q12H SCH Durene Romans, MD   500 mg at 03/20/22 0842    clobetasol (TEMOVATE) 0.05 % ointment   Topical BID PRN Linna Hoff, MD        diclofenac sodium (VOLTAREN) 1 % gel 2 g  2 g Topical QID Linna Hoff, MD   2 g at 03/19/22 9604    escitalopram oxalate (LEXAPRO) tablet 5 mg  5 mg Oral Daily Cleotis Nipper, MD   5 mg at 03/20/22 0842    famotidine (PEPCID) tablet 20 mg  20 mg Oral Nightly Achille Rich, MD   20 mg at 03/19/22 2135    fat emulsion 20 % (INTRAlipid) infusion 88.2 mL  1.5 mL/kg Intravenous Once PRN Lorely Bubb A, MD        fat emulsion 20 % (INTRAlipid) infusion  0.25 mL/kg/min Intravenous Once PRN Aysel Gilchrest A, MD        gabapentin (NEURONTIN) capsule 200 mg  200 mg Oral Nightly Linna Hoff, MD   200 mg at 03/19/22 2135    HYDROmorphone (DILAUDID) tablet 3 mg  3 mg Oral Nightly PRN Cleotis Nipper, MD   3 mg at 03/20/22 0025    HYDROmorphone (PF) injection Syrg 0.5 mg  0.5 mg Intravenous Q6H PRN Cleotis Nipper, MD   0.5 mg at 03/20/22 0443    hydrOXYzine (ATARAX) tablet 50 mg  50 mg Oral Q6H PRN Cleotis Nipper, MD        hyoscyamine (LEVSIN) tablet 125 mcg  125 mcg Oral Q4H PRN Hollie Beach P, DO        lactated Ringers infusion  10 mL/hr Intravenous Continuous Atziry Baranski A, MD        lidocaine 2000 mg in dextrose 5 % 250 mL (8 mg/mL) infusion PMB  1.75 mg/kg/hr Intravenous Continuous Amayra Kiedrowski A, MD 12.9 mL/hr at 03/19/22 2254 1.75 mg/kg/hr at 03/19/22 2254    melatonin tablet 3 mg  3 mg Oral Nightly PRN Linna Hoff, MD        mirtazapine (REMERON) tablet 7.5 mg  7.5 mg Oral Nightly Alan Ripper, DO   7.5 mg at 03/19/22 2135    naloxone [facility]) injection 0.4 mg  0.4 mg Intravenous Once PRN Linna Hoff, MD        OLANZapine (ZyPREXA) tablet 1.25 mg  1.25 mg Oral Daily PRN Cleotis Nipper, MD   1.25 mg at 03/18/22 1524    OLANZapine zydis (ZyPREXA) disintegrating tablet 7.5 mg  7.5 mg Oral Nightly Goldbeck, Lauren D, MD   7.5 mg at 03/19/22 2135    ondansetron (ZOFRAN-ODT) disintegrating tablet 8 mg  8 mg Oral Q12H PRN Hollie Beach P, DO   8 mg at 03/12/22 2956    oxyCODONE (ROXICODONE) immediate release tablet 10 mg  10 mg Oral Q4H PRN Achille Rich, MD   10 mg at 03/20/22 2130    Parenteral Nutrition (CENTRAL)   Intravenous Continuous Contarino, Collie Siad, MD 100 mL/hr at 03/20/22 0030 New Bag at 03/20/22 0030    [START ON 03/21/2022] Parenteral Nutrition (CENTRAL)   Intravenous Continuous Muthukkumar, Rashmi, MD        phenol (CHLORASEPTIC) 1.4 % spray 2 spray  2 spray Mucous Membrane Q2H PRN Goldbeck, Lauren D, MD        polyethylene glycol (MIRALAX) packet 17 g  17 g Oral Daily Cleotis Nipper, MD   17 g at 03/20/22 0843    simethicone (MYLICON) chewable tablet 80 mg  80 mg Oral Q6H PRN Achille Rich, MD   80 mg at 03/14/22 1810         Objective:     Vital Signs    Temp:  [36.6 ??C (97.9 ??F)-36.9 ??C (98.4 ??F)] 36.9 ??C (98.4 ??F)  Pulse:  [85-150] 96  Resp:  [18-22] 22  BP: (121-127)/(62-80) 124/77  MAP (mmHg):  [78-95] 92  SpO2:  [97 %-98 %] 97 %      Physical Exam    GENERAL:  Well developed, well-nourished female and is in mild distress.   HEAD/NECK:    Reveals normocephalic/atraumatic.   CARDIOVASCULAR:   RRR, no murmur  LUNGS:   Normal work of breathing, no supplemental 02  EXTREMITIES:  Warm, no clubbing, cyanosis, or edema was noted.  NEUROLOGIC:    The patient was alert and oriented times four with normal language, attention, cognition and memory. Cranial nerve exam was grossly normal.  MUSCULOSKELETAL:    Moving all extremities  SKIN:  No obvious rashes lesions or erythema  PSY:  Appropriate affect and mood.    Test Results    Lab Results   Component Value Date    CREATININE 0.53 (L) 03/20/2022     Lab Results   Component Value Date    ALKPHOS 42 (L) 03/20/2022    BILITOT 0.2 (L) 03/20/2022    BILIDIR <0.10 03/17/2022    PROT 6.0 03/20/2022    ALBUMIN 3.2 (L) 03/20/2022    ALT 30 03/20/2022    AST 16 03/20/2022           Problem List    Principal Problem:    GI bleeding  Active Problems:    [name]    [name]    Pain    Iron deficiency anemia due to chronic blood loss    Severe protein-calorie malnutrition (CMS-HCC)    Lower abdominal pain    Generalized anxiety disorder with panic attacks    MDD (major depressive disorder)    Small intestinal bacterial overgrowth (SIBO)    Ileus (CMS-HCC)

## 2022-03-20 NOTE — Unmapped (Signed)
Pt has a double lumen powerline

## 2022-03-21 LAB — COMPREHENSIVE METABOLIC PANEL
ALBUMIN: 3.2 g/dL — ABNORMAL LOW (ref 3.4–5.0)
ALKALINE PHOSPHATASE: 44 U/L — ABNORMAL LOW (ref 46–116)
ALT (SGPT): 28 U/L (ref 10–49)
ANION GAP: 7 mmol/L (ref 5–14)
AST (SGOT): 16 U/L (ref <=34)
BILIRUBIN TOTAL: <0.2 mg/dL — ABNORMAL LOW (ref 0.3–1.2)
BLOOD UREA NITROGEN: 13 mg/dL (ref 9–23)
BUN / CREAT RATIO: 27
CALCIUM: 8.4 mg/dL — ABNORMAL LOW (ref 8.7–10.4)
CHLORIDE: 106 mmol/L (ref 98–107)
CO2: 26 mmol/L (ref 20.0–31.0)
CREATININE: 0.48 mg/dL — ABNORMAL LOW
EGFR CKD-EPI (2021) FEMALE: >90 mL/min/{1.73_m2} (ref >=60)
GLUCOSE RANDOM: 140 mg/dL (ref 70–179)
POTASSIUM: 3.5 mmol/L (ref 3.4–4.8)
PROTEIN TOTAL: 6 g/dL (ref 5.7–8.2)
SODIUM: 139 mmol/L (ref 135–145)

## 2022-03-21 LAB — CBC
HEMATOCRIT: 26.6 % — ABNORMAL LOW (ref 34.0–44.0)
HEMOGLOBIN: 8.9 g/dL — ABNORMAL LOW (ref 11.3–14.9)
MEAN CORPUSCULAR HEMOGLOBIN CONC: 33.5 g/dL (ref 32.0–36.0)
MEAN CORPUSCULAR HEMOGLOBIN: 27.4 pg (ref 25.9–32.4)
MEAN CORPUSCULAR VOLUME: 81.7 fL (ref 77.6–95.7)
MEAN PLATELET VOLUME: 9.5 fL (ref 6.8–10.7)
PLATELET COUNT: 224 10*9/L (ref 150–450)
RED BLOOD CELL COUNT: 3.25 10*12/L — ABNORMAL LOW (ref 3.95–5.13)
RED CELL DISTRIBUTION WIDTH: 15.1 % (ref 12.2–15.2)
WBC ADJUSTED: 7.8 10*9/L (ref 3.6–11.2)

## 2022-03-21 LAB — MAGNESIUM: MAGNESIUM: 2.1 mg/dL (ref 1.6–2.6)

## 2022-03-21 LAB — PHOSPHORUS: PHOSPHORUS: 3.8 mg/dL (ref 2.4–5.1)

## 2022-03-21 MED ADMIN — OLANZapine zydis (ZyPREXA) disintegrating tablet 7.5 mg: 7.5 mg | ORAL | @ 03:00:00

## 2022-03-21 MED ADMIN — mirtazapine (REMERON) tablet 7.5 mg: 7.5 mg | ORAL | @ 03:00:00

## 2022-03-21 MED ADMIN — ondansetron (ZOFRAN-ODT) disintegrating tablet 8 mg: 8 mg | ORAL | @ 14:00:00

## 2022-03-21 MED ADMIN — oxyCODONE (ROXICODONE) immediate release tablet 10 mg: 10 mg | ORAL | @ 14:00:00 | Stop: 2022-03-25

## 2022-03-21 MED ADMIN — Parenteral Nutrition (CENTRAL): INTRAVENOUS | @ 04:00:00 | Stop: 2022-03-21

## 2022-03-21 MED ADMIN — escitalopram oxalate (LEXAPRO) tablet 5 mg: 5 mg | ORAL | @ 14:00:00

## 2022-03-21 MED ADMIN — HYDROmorphone (PF) (DILAUDID) injection 0.4 mg: .4 mg | INTRAVENOUS | @ 23:00:00 | Stop: 2022-03-29

## 2022-03-21 MED ADMIN — fat emulsion 20 % with fish oil (SMOFlipid) infusion 250 mL: 250 mL | INTRAVENOUS | @ 04:00:00 | Stop: 2022-03-21

## 2022-03-21 MED ADMIN — HYDROmorphone (PF) injection Syrg 0.5 mg: .5 mg | INTRAVENOUS | @ 10:00:00 | Stop: 2022-03-21

## 2022-03-21 MED ADMIN — HYDROmorphone (PF) injection Syrg 0.5 mg: .5 mg | INTRAVENOUS | @ 04:00:00 | Stop: 2022-03-29

## 2022-03-21 MED ADMIN — diphenhydrAMINE (BENADRYL) capsule/tablet 25 mg: 25 mg | ORAL | @ 20:00:00 | Stop: 2022-03-21

## 2022-03-21 MED ADMIN — famotidine (PEPCID) tablet 20 mg: 20 mg | ORAL | @ 03:00:00

## 2022-03-21 MED ADMIN — cholecalciferol (vitamin D3 25 mcg (1,000 units)) tablet 25 mcg: 25 ug | ORAL | @ 14:00:00

## 2022-03-21 MED ADMIN — HYDROmorphone (PF) injection Syrg 0.5 mg: .5 mg | INTRAVENOUS | @ 17:00:00 | Stop: 2022-03-21

## 2022-03-21 MED ADMIN — ciprofloxacin HCl (CIPRO) tablet 500 mg: 500 mg | ORAL | @ 14:00:00 | Stop: 2022-04-08

## 2022-03-21 MED ADMIN — hydrOXYzine (ATARAX) tablet 50 mg: 50 mg | ORAL | @ 14:00:00

## 2022-03-21 MED ADMIN — polyethylene glycol (MIRALAX) packet 17 g: 17 g | ORAL | @ 14:00:00

## 2022-03-21 MED ADMIN — ciprofloxacin HCl (CIPRO) tablet 500 mg: 500 mg | ORAL | @ 03:00:00 | Stop: 2022-04-08

## 2022-03-21 MED ADMIN — acetaminophen (TYLENOL) tablet 1,000 mg: 1000 mg | ORAL | @ 03:00:00

## 2022-03-21 MED ADMIN — hydrOXYzine (ATARAX) tablet 50 mg: 50 mg | ORAL | @ 23:00:00

## 2022-03-21 MED ADMIN — oxyCODONE (ROXICODONE) immediate release tablet 10 mg: 10 mg | ORAL | @ 03:00:00 | Stop: 2022-03-25

## 2022-03-21 MED ADMIN — acetaminophen (TYLENOL) tablet 1,000 mg: 1000 mg | ORAL | @ 10:00:00

## 2022-03-21 MED ADMIN — gabapentin (NEURONTIN) capsule 200 mg: 200 mg | ORAL | @ 03:00:00

## 2022-03-21 MED ADMIN — oxyCODONE (ROXICODONE) immediate release tablet 10 mg: 10 mg | ORAL | @ 20:00:00 | Stop: 2022-03-25

## 2022-03-21 MED ADMIN — acetaminophen (TYLENOL) tablet 1,000 mg: 1000 mg | ORAL | @ 20:00:00

## 2022-03-21 NOTE — Unmapped (Signed)
Pain Management Plan and Goals    Hospitalization:  -Adequately control pain with the goal of maximizing nonopioid medications in order to help with bowel motility  -Reduce IV hydromorphone requirements gradually while still maintaining good pain levels  -Prepare for an appropriate transition pain plan from the hospital to home as there is progress to discharge    Long-term goals:  -Find a regimen that is beneficial for you while decreasing opioid requirements to reduce amount of pain exacerbations from bowel issues that may lead to hospitalizations   -Ideally Butrans and another medication for breakthrough pain

## 2022-03-21 NOTE — Unmapped (Signed)
Pt alert and oriented x4. VS within parameters on RA. Administered multiple PRNs for pain with moderate relief. TPN infusing. Family member at bedside. All safety interventions maintained throughout the shift.     Problem: Adult Inpatient Plan of Care  Goal: Plan of Care Review  03/21/2022 0541 by Genevive Bi, RN  Outcome: Ongoing - Unchanged  03/21/2022 0540 by Genevive Bi, RN  Outcome: Ongoing - Unchanged  Goal: Patient-Specific Goal (Individualized)  03/21/2022 0541 by Genevive Bi, RN  Outcome: Ongoing - Unchanged  03/21/2022 0540 by Genevive Bi, RN  Outcome: Ongoing - Unchanged  Goal: Absence of Hospital-Acquired Illness or Injury  03/21/2022 0541 by Genevive Bi, RN  Outcome: Ongoing - Unchanged  03/21/2022 0540 by Genevive Bi, RN  Outcome: Ongoing - Unchanged  Intervention: Identify and Manage Fall Risk  Recent Flowsheet Documentation  Taken 03/20/2022 2000 by Genevive Bi, RN  Safety Interventions:   environmental modification   fall reduction program maintained   lighting adjusted for tasks/safety   low bed   nonskid shoes/slippers when out of bed  Intervention: Prevent and Manage VTE (Venous Thromboembolism) Risk  Recent Flowsheet Documentation  Taken 03/20/2022 2000 by Genevive Bi, RN  Anti-Embolism Intervention: (active bleeding) Other (Comment)  Intervention: Prevent Infection  Recent Flowsheet Documentation  Taken 03/20/2022 2000 by Genevive Bi, RN  Infection Prevention:   environmental surveillance performed   equipment surfaces disinfected   hand hygiene promoted   personal protective equipment utilized  Goal: Optimal Comfort and Wellbeing  03/21/2022 0541 by Genevive Bi, RN  Outcome: Ongoing - Unchanged  03/21/2022 0540 by Genevive Bi, RN  Outcome: Ongoing - Unchanged  Goal: Readiness for Transition of Care  03/21/2022 0541 by Genevive Bi, RN  Outcome: Ongoing - Unchanged  03/21/2022 0540 by Genevive Bi, RN  Outcome: Ongoing - Unchanged  Goal: Rounds/Family Conference  03/21/2022 0541 by Genevive Bi, RN  Outcome: Ongoing - Unchanged  03/21/2022 0540 by Genevive Bi, RN  Outcome: Ongoing - Unchanged

## 2022-03-21 NOTE — Unmapped (Signed)
Pt's pain is tolerable with lidocaine gtt and PRN dilaudid/oxy. Nausea controlled with zofranx1. Ambulated in hallway multiple times. Tolerated TPN. Remains Aox4 and comfortable.     Problem: Adult Inpatient Plan of Care  Goal: Plan of Care Review  Outcome: Progressing  Flowsheets (Taken 03/20/2022 1646)  Progress: improving  Plan of Care Reviewed With:   patient   parent  Goal: Patient-Specific Goal (Individualized)  Outcome: Progressing  Goal: Absence of Hospital-Acquired Illness or Injury  Outcome: Progressing  Goal: Optimal Comfort and Wellbeing  Outcome: Progressing  Goal: Readiness for Transition of Care  Outcome: Progressing  Goal: Rounds/Family Conference  Outcome: Progressing

## 2022-03-21 NOTE — Unmapped (Signed)
Problem: Adult Inpatient Plan of Care  Goal: Plan of Care Review  Outcome: Ongoing - Unchanged  Goal: Patient-Specific Goal (Individualized)  Outcome: Ongoing - Unchanged  Goal: Absence of Hospital-Acquired Illness or Injury  Outcome: Ongoing - Unchanged  Goal: Optimal Comfort and Wellbeing  Outcome: Ongoing - Unchanged  Goal: Readiness for Transition of Care  Outcome: Ongoing - Unchanged  Goal: Rounds/Family Conference  Outcome: Ongoing - Unchanged     Problem: Latex Allergy  Goal: Absence of Allergy Symptoms  Outcome: Ongoing - Unchanged     Problem: Gastrointestinal Bleeding  Goal: Optimal Coping with Acute Illness  Outcome: Ongoing - Unchanged  Goal: Hemostasis  Outcome: Ongoing - Unchanged     Problem: Pain Acute  Goal: Optimal Pain Control and Function  Outcome: Ongoing - Unchanged     Problem: Fall Injury Risk  Goal: Absence of Fall and Fall-Related Injury  Outcome: Ongoing - Unchanged

## 2022-03-21 NOTE — Unmapped (Signed)
Oncology Brief Note    Reason for Visit:   [name] [name] is a 22 y.o. [name] associated desmoid fibromatosis, chronic malnutrition, abdominal pain of unclear etiology     Discussion/Plan:   Visited for about an hour with [name], asked her mother to step out which she willingly did. Separate brief conversation with her mother, Natalia Leatherwood.    Spent much of the visit providing support to [place] during this prolonged and challenging hospital stay. Emphasized a few points:  -validated her recent challenges with anxiety and depression - these are not in her head but are intrinsically related to prolonged pain, chronic illness, chronic stress that has ultimately exhausted and exceeded her ability to cope and push through, which she has tried to do and successfully done for a long time.   -our job as her medical team is to manage both her physical and mental health as they are closely related  -agree with the addition of a serotonin medication for depression, this will take time to take full effect. Agree with atarax as a prn medication.  -agree with holistic / multimodal pain management strategy. Opioids have certainly been helpful and are not unreasonable as part of her pain management plan, but clearly have drawbacks and have not been able to manage her pain entirely  -low level of concern about the lymph nodes in her abdomen, I do not think these are malignant given stability over time and borderline size  -midline mesenteric desmoid may be contributing to partial SBO / ileus but is not entirely responsible  -overall, I think her desmoids are relatively stable at present (responded to sorafenib, now off the sorafenib but growth is likely to be slow)  -I am working on an outpatient management plan for her desmoids but right now, functional recovery and mental health are paramount  -lots of uncertainty and challenges that we may have to tolerate  -reiterated goal to get her back to function - this is a challenging stretch of a few months but does not necessarily mean this will be her life moving forward  -I will coordinate outpatient follow up once she leaves the hospital (hopefully next week)    Donzetta Sprung, MD/MPH  Assistant Professor  Bone and Soft Tissue Oncology Program  [facility]  Pager: [phone]  Nurse Navigator: Roseanne Reno RN, BSN, OCN

## 2022-03-21 NOTE — Unmapped (Signed)
[facility]  Follow-Up Psychiatry Consult Note     Service Date: March 21, 2022  LOS:  LOS: 9 days      Assessment:   [name] [name] is a 22 y.o. 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    Patient was seen in consultation by Psychiatry at the request of [name], MD with Med [facility] Echevaria (MDW) for evaluation of  medication recommendations/starting an SSRI .     The patient's presentation, including presence of multiple worries that are difficult to control, sleep disturbance, fatigue/low energy, and impaired concentration appears to be most consistent with well-documented diagnosis of generalized anxiety disorder. Patient also has known Major Depressive Disorder characterized by reports of depressed mood, sleep disturbance, low energy, impaired concentration. Also consider adjustment disorder with depression due to acute severe medical conditions. There's also a concern that patient's poor nutrition status is contributing to her withdrawn state, low energy, and some other somatic concerns. Per Chart Review, patient has been established with [facility] since 11/07/2021 and it has been documented that patient has not tolerated higher dose of Remeron beyond 7.5mg . [facility]ry also started and titrated Olanzapine for anxiety, sleep, and appetite.     As of 1/12, patient notes improvement in anxiety since titration of hydroxyzine- notes increased ambulation and appetite and was able to tolerate solid food yesterday. She is tolerating Lexapro well without GI side effect or signs of bleeding, therefore plan to continue patient on Lexapro and continue to monitor side effects. Additionally, patient endorses improved control of anxiety symptoms with higher dose of Hydroxyzine prn started yesterday, therefore plan to continue patient on PRN Hydroxyzine. Possible discharge set for this weekend as pain regimen is optimized. No psychiatric barriers to discharge at this time.     Please see below for detailed recommendations.    Diagnoses:   Active Hospital problems:  Principal Problem:    GI bleeding  Active Problems:    [name]    [name]    Pain    Iron deficiency anemia due to chronic blood loss    Severe protein-calorie malnutrition (CMS-HCC)    Lower abdominal pain    Generalized anxiety disorder with panic attacks    MDD (major depressive disorder)    Small intestinal bacterial overgrowth (SIBO)    Ileus (CMS-HCC)     Problems edited/added by me:  No problems updated.    Safety Risk Assessment:  There was a psychiatric evaluation with full risk assessment performed on 03/13/2022. At this time there is no change in risk, patient remains at low risk of suicide/ harm to self and at low risk of harm to others.     Recommendations:   ## Safety and Observation Level:   -- Based on the BSA or psychiatric evaluation, we estimate the patient to be at low risk for suicide in the current setting. We recommend routine level of observation on medical unit. This decision is based on my review of the chart including patient's history and current presentation, interview of the patient, mental status examination, and consideration of suicide risk including evaluating suicidal ideation, plan, intent, suicidal or self-harm behaviors, risk factors, and protective factors. This judgment is based  on our ability to directly address suicide risk, implement suicide prevention strategies and develop a safety plan while the patient is in the clinical setting.   -- If the patient attempts to leave against medical advice and it is felt to be unsafe for them to leave, please call a Behavioral Response and page Psychiatry at [phone].  -- Please contact our team if there is a concern that risk level has changed.    ## Discharge Medications:   -- CONTINUE Atarax 50 mg q8H prn anxiety while inpatient, recommend discharge with Atarax 50 mg q8H prn anxiety   -- CONTINUE Lexapro 5 mg daily    -- CONTINUE Olanzapine 1.25 mg as needed daily for agitation while inpatient, can DISCONTINUE on discharge  -- CONTINUE home Olanzapine 7.5 mg at bedtime  -- CONTINUE home Mirtazapine 7.5 mg at bedtime  -- CONTINUE Gabapentin 200 mg nightly      ## Medical Decision Making Capacity:   -- A formal capacity assessement was not performed as a part of this evaluation.  If specific capacity questions arise, please contact our team as below.     ## Further Work-up:   -- Continue to work-up and treat possible medical conditions that may be contributing to current presentation.   -- While the patient is receiving medications (such as Olanzapine) that may prolong QTc and increase risk for torsades:     - MONITOR and KEEP Mg>2 and K>4      - MONITOR QTc regularly.  If QTc on tele strip >410ms, obtain 12-lead EKG.    ## Disposition:   -- When patient is discharged, please ensure that their AVS includes information about the 5 Suicide & Crisis Lifeline.  -- There are no psychiatric contraindications to discharging this patient when medically appropriate.  -- The patient will follow-up with [facility] with Dr. Kathrine Haddock [facility] Psychiatry Fellow) for mental health care at the time of discharge.    ## Behavioral / Environmental:   -- Although not currently delirious, the patient is at an elevated risk for developing delirium. Please utilize delirium prevention protocol.    Thank you for this consult request. Recommendations have been communicated to the primary team.  We will follow as needed at this time. Please page [phone] for any questions or concerns.       Clois Dupes MS4     Discussed with and seen by Fellow, Kathrine Haddock, MD.  Discussed with and seen by Attending, Maralyn Sago, MD, who agrees with the assessment and plan.      Lovett Calender, MD   PGY-3 Psychiatry     Interval History:   Relevant events since last seen by psychiatry  Discharge plan for with discharge home possibly this weekend. 1/12 Hg 8.9, trending up. Moderate pain relief with multiple PRNs.    PRN Usage:   Hydroxyzine tablet 50 mg  -1/11 x1     Olanzapine tablet 1.25 mg   -none taken yesterday      Pain Management:     - hydromorphone 3 mg oral tablet once nightly PRN  - Tylenol 1000 mg oral solution every 8 hours scheduled  - diclofenac 1% gel topically 4x/day scheduled  - gabapentin 200 mg oral capsule nightly scheduled  - Baclofen 5 mg oral tablet 3 times a day PRN, for muscle spasms  - Oxycodone 10 mg oral tablet q4h PRN, for moderate to severe pain  - Hydromorphone 0.4 mg IV q6h PRN, for refractory, breakthrough pain (  goal of weaning to off over the course of the next few days as abdominal pain improves)  -Continue naloxone intranasal as needed in the event of opioid induced respiratory depression or oversedation  -Continue engaging with pain psychology as needed     Patient Interview:   Mother present in room for interview. Patient denies any GI upset or bleeding since Lexapro was initiated. Reports that mood is still anxious but increased dose of Atarax  has helped. She is discussing possibility of discharge with the team over the Holiday weekend. Spoke with patient about sugn Mom reports, and patient confirms, that she was able to get out of bed and walk a bit, and felt hungry for the first time since her admission. Was able to eat half sandwich and some pie. Mom reports that Pain Management will be monitoring for side effects/withdrawal of medication wean over the weekend.  She is planning to follow up with Dr. Malcolm Metro in February.       Collateral information:   - Reviewed medical records in Epic  - Spoke to patient's mother see above interview where mom was present and actively participated during the interview      ROS:   All systems reviewed as negative/unremarkable aside from the following pertinent positives and negatives: abdominal pain    Collateral:   - Reviewed medical records in Epic    Relevant Updates to past psychiatric, medical/surgical, family, or social history: none    Current Medications:  Scheduled Meds:  ??? acetaminophen  1,000 mg Oral Q8H   ??? buprenorphine  1 patch Transdermal Q7 Days   ??? cholecalciferol (vitamin D3-10 mcg (400 unit))  25 mcg Oral Daily   ??? ciprofloxacin HCl  500 mg Oral Q12H SCH   ??? diclofenac sodium  2 g Topical QID   ??? escitalopram oxalate  5 mg Oral Daily   ??? famotidine  20 mg Oral Nightly   ??? gabapentin  200 mg Oral Nightly   ??? mirtazapine  7.5 mg Oral Nightly   ??? OLANZapine zydis  7.5 mg Oral Nightly   ??? polyethylene glycol  17 g Oral Daily     Continuous Infusions:  ??? [START ON 03/22/2022] Parenteral Nutrition (CENTRAL)      And   ??? [START ON 03/22/2022] fat emulsion 20 % with fish oil     ??? lactated Ringers     ??? Parenteral Nutrition (CENTRAL) Stopped (03/21/22 1120)     PRN Meds:.baclofen, clobetasol, fluticasone propionate, HYDROmorphone, HYDROmorphone, hydrOXYzine, hyoscyamine, melatonin, naloxone, OLANZapine, ondansetron, oxyCODONE, phenol, simethicone     Objective:   Vital signs:   Temp:  [36.6 ??C (97.9 ??F)-37 ??C (98.6 ??F)] 36.7 ??C (98.1 ??F)  Pulse:  [81-93] 86  Resp:  [18-19] 18  BP: (109-144)/(51-78) 126/70  MAP (mmHg):  [68-90] 84  SpO2:  [97 %-98 %] 98 %    Physical Exam:  Gen: No acute distress.  Pulm: Normal work of breathing.  Neuro/MSK: Normal bulk/tone. Gait/station deferred.  Skin: normal skin tone.    Mental Status Exam:  Appearance:  appears stated age and clean/Neat   Attitude:   calm and cooperative   Behavior/Psychomotor:  appropriate eye contact and no abnormal movements   Speech/Language:   normal rate, volume, tone, fluency and language intact, well formed   Mood:  ???anxious???   Affect:  constricted, mood congruent, sad and guarded   Thought process:  logical, linear, clear, coherent, goal directed   Thought content:    denies thoughts of  self-harm. Denies SI, plans, or intent. Denies HI.  No grandiose, self-referential, persecutory, or paranoid delusions noted.   Perceptual disturbances:   denies auditory and visual hallucinations and behavior not concerning for response to internal stimuli   Attention:   Able to complete days of the week backwards, months of the year backwards and count backwards from 42 to 27   Concentration:  Able to fully concentrate and attend   Orientation:  Oriented to person, place, city, date, month, year and situation.   Memory:  not formally tested, but grossly intact   Fund of knowledge:   not formally assessed   Insight:    Fair   Judgment:   Fair   Impulse Control:  Fair       Data Reviewed:  I reviewed labs from the last 24 hours.     Additional Psychometric Testing:  Not applicable.    Consult Type and Time-Based Documentation:  This patient was evaluated in person.    Time-based billing disclaimer:  I personally spent 30   minutes face-to-face and non-face-to-face in the care of this patient, which includes all pre, intra, and post visit time on the date of service. ??All documented time was specific to the E/M visit and does not include any procedures that may have been performed.

## 2022-03-21 NOTE — Unmapped (Signed)
Department of Anesthesiology  Pain Medicine Division    Chronic Pain Followup Inpatient Follow-Up Note    Requesting Attending Physician:  [name], MD  Service Requesting Consult:  Med General Welt (MDW)    Assessment/Recommendations:  22 y.o. 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 Follows with [name] at [facility].    Our service is re-engaging with Ms. Gardiner's care as she has had an exacerbation in her abdominal pain after discontinuation of her hydromorphone IV on 1/5, the exacerbation of abdominal pain over the weekend was likely multifactorial in etiology - there were concerns of possible, transient SBO at a site of tethering in the small intestine on  CT and an ongoing, opioid-induced ileus. This exacerbation in pain necessitating reinitiation of IV hydromorphone.  She states that her pain is currently appropriately controlled at this time.  However, given concerns of her ongoing ileus and escalation of opioids; the primary team had wanted our team to reengage for assistance in reducing opioid requirements.    She did well with a lidocaine infusion at 1.5 mg/kg/hr over 72 hours.     Her primary pain generators include acute abdominal pain from possible transient small bowel obstruction, chronic pain in the setting of multiple abdominal surgeries and desmoid tumors.    Interval: Pain exacerbated with increased movement yesterday, mild improvement in analgesia. Tolerating medications without adverse effects.     Recommendations:  -The chronic pain service is a consult service and does not place orders, just makes recommendations (except ketamine and lidocaine infusions)   -Please evaluate all patients on opioids for appropriateness of prescribing narcan at discharge.  The chronic pain service can assist with this.  Nasal narcan is covered by most insurances.  -Recommendations given apply to the current hospitalization and do not reflect long term recommendations.    Changes to current analgesic regimen:  -Hydromorphone order to reflect: hydromorphone 0.4 mg IV every 6 hours as needed for refractory, breakthrough pain (goal of weaning to off over the course of the next few days as abdominal pain improves)    Continue without changes:  -Continue Butrans 5 mcg/hr patch every 7 days scheduled (please spray flonase onto site of patch application in order to prevent hypersensitivity that is adhesive-related; please ensure that patch alternates sites)   -Continue hydromorphone 3 mg oral tablet once nightly as needed   -Continue acetaminophen 1000 mg oral solution every 8 hours scheduled  -Continue diclofenac 1% gel topically 4 times a day scheduled  -Continue gabapentin 200 mg oral capsule nightly scheduled  -Continue baclofen 5 mg oral tablet 3 times a day as needed for muscle spasms  -Continue oxycodone 10 mg oral tablet every 4 hours as needed for moderate to severe pain  -Continue naloxone intranasal as needed in the event of opioid induced respiratory depression or oversedation  -Continue engaging with pain psychology as needed (reinforced to patient that she will have to advocate for herself and let us know if she would like to set up an appointment)    Home MME: 135  Current MME: 85    We will continue to follow.    Naloxone Rx at discharge?  Is patient on opioids? Yes.  1)Is dose >50MME?  Yes.  2) Is patient prescribed a benzodiazepine (w opioids)? No.  3)Hx of overdose?  No.  4) Hx of substance  use disorder? No.  5) Opioids likely to last greater than a week after discharge? Yes.    If yes to 2 or more, prescribe naloxone at discharge.  Nasal narcan for most insured (Nasal narcan 4mg /actuation, prescribe 1 kit, instructions at [email or web address]).  For uninsured, chronic pain can work to assist in finding an option.  OTC nasal narcan now available at most pharmacies for around $45.    Interim History  There were no Acute Events Overnight.  The patient is obtaining adequate pain relief on current medication regimen and feels that their pain is Somewhat controlled    Analgesia Evaluation:  Pain at minimum: 4/10  Pain at maximum: 8/10    Current pain medication regimen (including how frequent PRN's were used):  IV HM 0.5 x 4  PO Oxy 10 x2      Inpatient Medications  Current Facility-Administered Medications   Medication Dose Route Frequency Provider Last Rate Last Admin    acetaminophen (TYLENOL) tablet 1,000 mg  1,000 mg Oral Q8H Cleotis Nipper, MD   1,000 mg at 03/21/22 0523    baclofen (LIORESAL) tablet 5 mg  5 mg Oral TID PRN Linna Hoff, MD   5 mg at 03/20/22 1610    buprenorphine 5 mcg/hour transdermal patch 1 patch  1 patch Transdermal Q7 Days Muthukkumar, Rashmi, MD   1 patch at 03/20/22 0940    cholecalciferol (vitamin D3 25 mcg (1,000 units)) tablet 25 mcg  25 mcg Oral Daily Cleotis Nipper, MD   25 mcg at 03/21/22 0909    ciprofloxacin HCl (CIPRO) tablet 500 mg  500 mg Oral Q12H SCH Durene Romans, MD   500 mg at 03/21/22 0910    clobetasol (TEMOVATE) 0.05 % ointment   Topical BID PRN Linna Hoff, MD        diclofenac sodium (VOLTAREN) 1 % gel 2 g  2 g Topical QID Linna Hoff, MD   2 g at 03/20/22 1109    escitalopram oxalate (LEXAPRO) tablet 5 mg  5 mg Oral Daily Cleotis Nipper, MD   5 mg at 03/21/22 0909    famotidine (PEPCID) tablet 20 mg  20 mg Oral Nightly Achille Rich, MD   20 mg at 03/20/22 2141    Parenteral Nutrition (CENTRAL)   Intravenous Continuous [name], MD 100 mL/hr at 03/20/22 2328 New Bag at 03/20/22 2328    And    fat emulsion 20 % with fish oil (SMOFlipid) infusion 250 mL  250 mL Intravenous Continuous [name], MD 20.8 mL/hr at 03/20/22 2329 250 mL at 03/20/22 2329    [START ON 03/22/2022] Parenteral Nutrition (CENTRAL) Intravenous Continuous Contarino, Collie Siad, MD        And    Melene Muller ON 03/22/2022] fat emulsion 20 % with fish oil (SMOFlipid) infusion 250 mL  250 mL Intravenous Continuous Contarino, Collie Siad, MD        fluticasone propionate (FLONASE) 50 mcg/actuation nasal spray 1 spray  1 spray Topical Daily PRN Muthukkumar, Rashmi, MD        gabapentin (NEURONTIN) capsule 200 mg  200 mg Oral Nightly Linna Hoff, MD   200 mg at 03/20/22 2141    HYDROmorphone (DILAUDID) tablet 3 mg  3 mg Oral Nightly PRN Cleotis Nipper, MD   3 mg at 03/20/22 0025    HYDROmorphone (PF) injection Syrg 0.5 mg  0.5 mg Intravenous Q6H PRN Cleotis Nipper, MD   0.5 mg  at 03/21/22 0520    hydrOXYzine (ATARAX) tablet 50 mg  50 mg Oral Q6H PRN Cleotis Nipper, MD   50 mg at 03/21/22 1610    hyoscyamine (LEVSIN) tablet 125 mcg  125 mcg Oral Q4H PRN Hollie Beach P, DO        lactated Ringers infusion  10 mL/hr Intravenous Continuous Mariesa Grieder A, MD        melatonin tablet 3 mg  3 mg Oral Nightly PRN Linna Hoff, MD        mirtazapine (REMERON) tablet 7.5 mg  7.5 mg Oral Nightly Alan Ripper, DO   7.5 mg at 03/20/22 2141    naloxone (NARCAN) injection 0.4 mg  0.4 mg Intravenous Once PRN Linna Hoff, MD        OLANZapine (ZyPREXA) tablet 1.25 mg  1.25 mg Oral Daily PRN Muthukkumar, Rashmi, MD        OLANZapine zydis (ZyPREXA) disintegrating tablet 7.5 mg  7.5 mg Oral Nightly Goldbeck, Lauren D, MD   7.5 mg at 03/20/22 2141    ondansetron (ZOFRAN-ODT) disintegrating tablet 8 mg  8 mg Oral Q12H PRN Hollie Beach P, DO   8 mg at 03/21/22 0913    oxyCODONE (ROXICODONE) immediate release tablet 10 mg  10 mg Oral Q4H PRN Achille Rich, MD   10 mg at 03/21/22 0909    phenol (CHLORASEPTIC) 1.4 % spray 2 spray  2 spray Mucous Membrane Q2H PRN Goldbeck, Eliott Nine, MD        polyethylene glycol (MIRALAX) packet 17 g  17 g Oral Daily Cleotis Nipper, MD   17 g at 03/21/22 0910    simethicone (MYLICON) chewable tablet 80 mg  80 mg Oral Q6H PRN Achille Rich, MD   80 mg at 03/14/22 1810         Objective:     Vital Signs    Temp:  [36.6 ??C (97.9 ??F)-37 ??C (98.6 ??F)] 36.7 ??C (98.1 ??F)  Pulse:  [81-92] 86  Resp:  [18-19] 18  BP: (109-127)/(51-78) 126/70  MAP (mmHg):  [68-90] 84  SpO2:  [97 %-98 %] 98 %      Physical Exam    GENERAL:  Well developed, well-nourished female and is in mild distress.   HEAD/NECK:    Reveals normocephalic/atraumatic.   CARDIOVASCULAR:   RRR, no murmur  LUNGS:   Normal work of breathing, no supplemental 02  EXTREMITIES:  Warm, no clubbing, cyanosis, or edema was noted.  NEUROLOGIC:    The patient was alert and oriented times four with normal language, attention, cognition and memory. Cranial nerve exam was grossly normal.    MUSCULOSKELETAL:    Moving all extremities  SKIN:  No obvious rashes lesions or erythema  PSY:  Appropriate affect and mood.    Test Results    Lab Results   Component Value Date    CREATININE 0.48 (L) 03/21/2022     Lab Results   Component Value Date    ALKPHOS 44 (L) 03/21/2022    BILITOT <0.2 (L) 03/21/2022    BILIDIR <0.10 03/17/2022    PROT 6.0 03/21/2022    ALBUMIN 3.2 (L) 03/21/2022    ALT 28 03/21/2022    AST 16 03/21/2022           Problem List    Principal Problem:    GI bleeding  Active Problems:    [name]    [name]    Pain  Iron deficiency anemia due to chronic blood loss    Severe protein-calorie malnutrition (CMS-HCC)    Lower abdominal pain    Generalized anxiety disorder with panic attacks    MDD (major depressive disorder)    Small intestinal bacterial overgrowth (SIBO)    Ileus (CMS-HCC)

## 2022-03-21 NOTE — Unmapped (Signed)
Internal Medicine (MEDW) Progress Note    Assessment & Plan:   [name] [name] is a 22 y.o. female whose presentation is complicated by [name] [name] Syndrome ([name]) s/p proctocolectomy w/ileoanal anastomosis, desmoid tumors (previously on sorafenib), anemia, on TPN, anxiety/nausea, recent admission for LGIB (anastomotic ulcer, anal fissures), presenting with BRBPR and recurrent abdominal pain.      Principal Problem:    GI bleeding  Active Problems:    [name]    [name]    Pain    Iron deficiency anemia due to chronic blood loss    Severe protein-calorie malnutrition (CMS-HCC)    Lower abdominal pain    Generalized anxiety disorder with panic attacks    MDD (major depressive disorder)    Small intestinal bacterial overgrowth (SIBO)    Ileus (CMS-HCC)  Resolved Problems:    * No resolved hospital problems. *    Active Problems    Acute on Chronic Abdominal Pain - Desmoid Tumors - Ileus (resolved) - Narcotic Bowel  Pain with small improvements today, despite flare with increased activity and oral intake. S/p 0.5 mg IV Dilaudid x 4 and 10 mg oral Oxy x 3 yesterday. Lidocaine infusion discontinued yesterday evening. Butrans patch placed 1/11. Pain plan provided to patient today. LFTs normalized.  - Low residue diet  - Continue daily MiraLax  - Pain Psychology following  - Continue Cipro 500mg  BID x14 days (EOT 1/16) [needs to be oral not IV]  - Pain Regimen:   - s/p Lidocaine infusion x 3 days (EOT 1/11)   - Butrans patch (Flonase admin to skin prior) q7 days   - Dilaudid 0.4 mg IV q6h PRN    - Dilaudid 3 mg oral tablet once nightly as needed    - Liquid Tylenol 1,000 mg q8hr  - Oxy 10mg  q6h PRN  - Gabapentin 200mg  at bedtime  - Baclofen 5mg  TID PRN     BRBPR I Acute on Chronic Anemia  Resolved. Known ulcer at ileoanal anastomosis noted on pouchoscopy during last admission. Re-bleed could be a result of sorafenib resumption, of which she has stopped indefinitely.  GI w/ no indication for repeat endoscopic eval at this time. Follow up on 1/22 w/ Dr. [name]. Hgb stable.  - continue trending Hgb daily   - Maintain active type and screen and adequate IV access with at least 2 large bore IV  - Transfuse Hgb <7    Severe Malnutrition 2/2 [name] and abdominal desmoid tumors   She is currently receiving TPN 5/7 days of the week and is being followed by Dr. [name], outpatient GI. Taking small amounts of oral intake.  - TPN consult  - Low residue diet     Anxiety - Panic Attack  Pt in talks with Adolescent Young Adult program at [facility]. Psychiatry following. Suspect episodes of pain and timing surrounding medications precipitate episodes of anxiety. 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 Pain was flaring a bit after walking the unit and increased PO.     No acute events overnight.     Objective:   Temp:  [36.6 ??C (97.9 ??F)-37 ??C (98.6 ??F)] 37 ??C (98.6 ??F)  Pulse: [81-104] 85  Resp:  [18-22] 19  BP: (109-127)/(51-78) 109/51  SpO2:  [97 %-98 %] 97 %    Gen: NAD, lying in bed, soft spoken  HENT: atraumatic, normocephalic  Heart: regular rate, regular rhythm  Lungs: CTAB anteriorly, no crackles or wheezes. No increased WOB on RA   Abdomen: soft, distention stable. Active bowel sounds. mildly TTP    [name] Lockie Pares, MD   [facility] Internal Medicine PGY-1

## 2022-03-22 MED ADMIN — buprenorphine 10 mcg/hour transdermal patch 1 patch: 1 | TRANSDERMAL | @ 16:00:00

## 2022-03-22 MED ADMIN — HYDROmorphone (PF) (DILAUDID) injection 0.25 mg: .25 mg | INTRAVENOUS | @ 18:00:00 | Stop: 2022-03-29

## 2022-03-22 MED ADMIN — HYDROmorphone (PF) (DILAUDID) injection 0.25 mg: .25 mg | INTRAVENOUS | @ 22:00:00 | Stop: 2022-03-22

## 2022-03-22 MED ADMIN — oxyCODONE (ROXICODONE) immediate release tablet 10 mg: 10 mg | ORAL | @ 14:00:00 | Stop: 2022-03-25

## 2022-03-22 MED ADMIN — ciprofloxacin HCl (CIPRO) tablet 500 mg: 500 mg | ORAL | @ 02:00:00 | Stop: 2022-04-08

## 2022-03-22 MED ADMIN — polyethylene glycol (MIRALAX) packet 17 g: 17 g | ORAL | @ 14:00:00

## 2022-03-22 MED ADMIN — hydrOXYzine (ATARAX) tablet 50 mg: 50 mg | ORAL | @ 12:00:00

## 2022-03-22 MED ADMIN — escitalopram oxalate (LEXAPRO) tablet 5 mg: 5 mg | ORAL | @ 14:00:00

## 2022-03-22 MED ADMIN — fat emulsion 20 % with fish oil (SMOFlipid) infusion 250 mL: 250 mL | INTRAVENOUS | @ 05:00:00 | Stop: 2022-03-22

## 2022-03-22 MED ADMIN — oxyCODONE (ROXICODONE) immediate release tablet 10 mg: 10 mg | ORAL | @ 02:00:00 | Stop: 2022-03-25

## 2022-03-22 MED ADMIN — acetaminophen (TYLENOL) tablet 1,000 mg: 1000 mg | ORAL | @ 11:00:00

## 2022-03-22 MED ADMIN — cholecalciferol (vitamin D3 25 mcg (1,000 units)) tablet 25 mcg: 25 ug | ORAL | @ 14:00:00

## 2022-03-22 MED ADMIN — HYDROmorphone (PF) (DILAUDID) injection 0.4 mg: .4 mg | INTRAVENOUS | @ 06:00:00 | Stop: 2022-03-22

## 2022-03-22 MED ADMIN — gabapentin (NEURONTIN) capsule 200 mg: 200 mg | ORAL | @ 02:00:00

## 2022-03-22 MED ADMIN — OLANZapine zydis (ZyPREXA) disintegrating tablet 7.5 mg: 7.5 mg | ORAL | @ 02:00:00

## 2022-03-22 MED ADMIN — baclofen (LIORESAL) tablet 5 mg: 5 mg | ORAL | @ 22:00:00

## 2022-03-22 MED ADMIN — HYDROmorphone (PF) (DILAUDID) injection 0.4 mg: .4 mg | INTRAVENOUS | @ 12:00:00 | Stop: 2022-03-22

## 2022-03-22 MED ADMIN — oxyCODONE (ROXICODONE) immediate release tablet 10 mg: 10 mg | ORAL | @ 23:00:00 | Stop: 2022-03-25

## 2022-03-22 MED ADMIN — famotidine (PEPCID) tablet 20 mg: 20 mg | ORAL | @ 02:00:00

## 2022-03-22 MED ADMIN — hydrOXYzine (ATARAX) tablet 50 mg: 50 mg | ORAL | @ 18:00:00

## 2022-03-22 MED ADMIN — ciprofloxacin HCl (CIPRO) tablet 500 mg: 500 mg | ORAL | @ 14:00:00 | Stop: 2022-04-08

## 2022-03-22 MED ADMIN — acetaminophen (TYLENOL) tablet 1,000 mg: 1000 mg | ORAL | @ 18:00:00

## 2022-03-22 MED ADMIN — oxyCODONE (ROXICODONE) immediate release tablet 10 mg: 10 mg | ORAL | @ 19:00:00 | Stop: 2022-03-25

## 2022-03-22 MED ADMIN — mirtazapine (REMERON) tablet 7.5 mg: 7.5 mg | ORAL | @ 02:00:00

## 2022-03-22 MED ADMIN — Parenteral Nutrition (CENTRAL): INTRAVENOUS | @ 05:00:00 | Stop: 2022-03-23

## 2022-03-22 NOTE — Unmapped (Addendum)
A/O x 4, afebrile, VSS on RA, denies pain at this time or PRN meds given for pain (see MAR). IV clean, dry, intact, and flushed per protocol. Falls precautions maintained. Call bell and bedside table within reach, bed in lowest position and locked. No falls or injuries noted this shift.        Problem: Adult Inpatient Plan of Care  Goal: Plan of Care Review  Outcome: Ongoing - Unchanged  Goal: Patient-Specific Goal (Individualized)  Outcome: Ongoing - Unchanged  Goal: Absence of Hospital-Acquired Illness or Injury  Outcome: Ongoing - Unchanged  Intervention: Identify and Manage Fall Risk  Recent Flowsheet Documentation  Taken 03/22/2022 0730 by Johnna Acosta, RN  Safety Interventions: family at bedside  Intervention: Prevent Infection  Recent Flowsheet Documentation  Taken 03/22/2022 0730 by Johnna Acosta, RN  Infection Prevention: cohorting utilized  Goal: Optimal Comfort and Wellbeing  Outcome: Ongoing - Unchanged  Goal: Readiness for Transition of Care  Outcome: Ongoing - Unchanged  Goal: Rounds/Family Conference  Outcome: Ongoing - Unchanged

## 2022-03-22 NOTE — Unmapped (Signed)
Department of Anesthesiology  Pain Medicine Division    Chronic Pain Followup Inpatient Follow-Up Note    Requesting Attending Physician:  [name], MD  Service Requesting Consult:  [facility] (MDW)    Assessment/Recommendations:  22 y.o. 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 Follows with [name] at [facility]    Our service is re-engaging with [name]'s care as she has had an exacerbation in her abdominal pain after discontinuation of her hydromorphone IV on 1/5, the exacerbation of abdominal pain over the weekend was likely multifactorial in etiology - there were concerns of possible, transient SBO at a site of tethering in the small intestine on  CT and an ongoing, opioid-induced ileus. This exacerbation in pain necessitating reinitiation of IV hydromorphone.  She states that her pain is currently appropriately controlled at this time.  However, given concerns of her ongoing ileus and escalation of opioids; the primary team had wanted our team to reengage for assistance in reducing opioid requirements.    She did well with a lidocaine infusion at 1.5 mg/kg/hr over 72 hours.     Her primary pain generators include acute abdominal pain from possible transient small bowel obstruction, chronic pain in the setting of multiple abdominal surgeries and desmoid tumors.    Interval: Interval improvement and function, without change in baseline pain.    Recommendations:  -The chronic pain service is a consult service and does not place orders, just makes recommendations (except ketamine and lidocaine infusions)   -Please evaluate all patients on opioids for appropriateness of prescribing narcan at discharge.  The chronic pain service can assist with this.  Nasal narcan is covered by most insurances.  -Recommendations given apply to the current hospitalization and do not reflect long term recommendations.    Changes to current analgesic regimen:  -Hydromorphone order to reflect: hydromorphone 0.25 mg IV every 6 hours as needed for refractory, breakthrough pain (goal of weaning to off over the course of the next few days as abdominal pain improves)  -Discontinue Butrans 5 mcg/h patch and replace it with a Butrans 10 mcg/h patch (please spray flonase onto site of patch application in order to prevent hypersensitivity that is adhesive-related; please ensure that patch alternates sites)     Continue without changes:  -Continue Butrans 5 mcg/hr patch every 7 days scheduled   -Continue hydromorphone 3 mg oral tablet once nightly as needed   -Continue acetaminophen 1000 mg oral solution every 8 hours scheduled  -Continue diclofenac 1% gel topically 4 times a day scheduled  -Continue gabapentin 200 mg oral capsule nightly scheduled  -Continue baclofen 5 mg oral tablet 3 times a day as needed for muscle spasms  -Continue oxycodone 10 mg oral tablet every 4 hours as needed for moderate to severe pain  -Continue naloxone intranasal as needed in the event of opioid induced respiratory depression or oversedation  -Continue engaging with pain psychology as needed (reinforced to patient that she will have to advocate for herself and let us know if she would like to set up an appointment)    Home MME: 135  Current MME: 85    We will continue to follow.    Naloxone Rx at discharge?  Is patient on opioids? Yes.  1)Is dose >50MME?  Yes.  2) Is patient prescribed a benzodiazepine (w opioids)? No.  3)Hx  of overdose?  No.  4) Hx of substance use disorder? No.  5) Opioids likely to last greater than a week after discharge? Yes.    If yes to 2 or more, prescribe naloxone at discharge.  Nasal narcan for most insured (Nasal narcan 4mg /actuation, prescribe 1 kit, instructions at [email or web address]).  For uninsured, chronic pain can work to assist in finding an option.  OTC nasal narcan now available at most pharmacies for around $45.    Interim History  There were no Acute Events Overnight.  The patient is obtaining adequate pain relief on current medication regimen and feels that their pain is Somewhat controlled    Analgesia Evaluation:  Pain at minimum: 4/10  Pain at maximum: 8/10    Current pain medication regimen (including how frequent PRN's were used):  IV HM 0.5 x   IV HM 0.4 x 3  PO Oxy 10 x3      Inpatient Medications  Current Facility-Administered Medications   Medication Dose Route Frequency Provider Last Rate Last Admin    acetaminophen (TYLENOL) tablet 1,000 mg  1,000 mg Oral Q8H Cleotis Nipper, MD   1,000 mg at 03/22/22 0554    baclofen (LIORESAL) tablet 5 mg  5 mg Oral TID PRN Linna Hoff, MD   5 mg at 03/20/22 1610    buprenorphine 5 mcg/hour transdermal patch 1 patch  1 patch Transdermal Q7 Days Muthukkumar, Rashmi, MD   1 patch at 03/20/22 0940    cholecalciferol (vitamin D3 25 mcg (1,000 units)) tablet 25 mcg  25 mcg Oral Daily Cleotis Nipper, MD   25 mcg at 03/22/22 0904    ciprofloxacin HCl (CIPRO) tablet 500 mg  500 mg Oral Q12H SCH Durene Romans, MD   500 mg at 03/22/22 0904    clobetasol (TEMOVATE) 0.05 % ointment   Topical BID PRN Linna Hoff, MD        diclofenac sodium (VOLTAREN) 1 % gel 2 g  2 g Topical QID Linna Hoff, MD   2 g at 03/20/22 1109    escitalopram oxalate (LEXAPRO) tablet 5 mg  5 mg Oral Daily Cleotis Nipper, MD   5 mg at 03/22/22 0904    famotidine (PEPCID) tablet 20 mg  20 mg Oral Nightly Achille Rich, MD   20 mg at 03/21/22 2059    Parenteral Nutrition (CENTRAL)   Intravenous Continuous Contarino, Collie Siad, MD 100 mL/hr at 03/22/22 0014 New Bag at 03/22/22 0014    And    fat emulsion 20 % with fish oil (SMOFlipid) infusion 250 mL  250 mL Intravenous Continuous [name], MD 20.8 mL/hr at 03/22/22 0008 250 mL at 03/22/22 0008    [START ON 03/23/2022] Parenteral Nutrition (CENTRAL)   Intravenous Continuous Cleotis Nipper, MD        And    [START ON 03/23/2022] fat emulsion 20 % with fish oil (SMOFlipid) infusion 250 mL  250 mL Intravenous Continuous Hendrick, Rushie Chestnut, MD        fluticasone propionate (FLONASE) 50 mcg/actuation nasal spray 1 spray  1 spray Topical Daily PRN Muthukkumar, Rashmi, MD        gabapentin (NEURONTIN) capsule 200 mg  200 mg Oral Nightly Linna Hoff, MD   200 mg at 03/21/22 2058    HYDROmorphone (PF) (DILAUDID) injection 0.4 mg  0.4 mg Intravenous Q6H PRN Cleotis Nipper, MD   0.4 mg at 03/22/22 0630    hydrOXYzine (ATARAX)  tablet 50 mg  50 mg Oral Q6H PRN Cleotis Nipper, MD   50 mg at 03/22/22 0272    hyoscyamine (LEVSIN) tablet 125 mcg  125 mcg Oral Q4H PRN Hollie Beach P, DO        lactated Ringers infusion  10 mL/hr Intravenous Continuous Janay Canan A, MD        melatonin tablet 3 mg  3 mg Oral Nightly PRN Linna Hoff, MD        mirtazapine (REMERON) tablet 7.5 mg  7.5 mg Oral Nightly Alan Ripper, DO   7.5 mg at 03/21/22 2058    naloxone [facility]) injection 0.4 mg  0.4 mg Intravenous Once PRN Linna Hoff, MD        OLANZapine (ZyPREXA) tablet 1.25 mg  1.25 mg Oral Daily PRN Muthukkumar, Rashmi, MD        OLANZapine zydis (ZyPREXA) disintegrating tablet 7.5 mg  7.5 mg Oral Nightly Goldbeck, Lauren D, MD   7.5 mg at 03/21/22 2059    ondansetron (ZOFRAN-ODT) disintegrating tablet 8 mg  8 mg Oral Q12H PRN Hollie Beach P, DO   8 mg at 03/21/22 0913    oxyCODONE (ROXICODONE) immediate release tablet 10 mg  10 mg Oral Q4H PRN Achille Rich, MD   10 mg at 03/22/22 0904    phenol (CHLORASEPTIC) 1.4 % spray 2 spray  2 spray Mucous Membrane Q2H PRN Goldbeck, Eliott Nine, MD        polyethylene glycol (MIRALAX) packet 17 g  17 g Oral Daily Cleotis Nipper, MD   17 g at 03/22/22 0905    simethicone (MYLICON) chewable tablet 80 mg  80 mg Oral Q6H PRN Achille Rich, MD   80 mg at 03/14/22 1810         Objective:     Vital Signs    Temp:  [36.7 ??C (98.1 ??F)] 36.7 ??C (98.1 ??F)  Pulse:  [73-98] 98  Resp:  [17-18] 17  BP: (114-178)/(62-70) 114/62  MAP (mmHg):  [76-84] 76  SpO2:  [96 %-98 %] 96 %      Physical Exam    GENERAL:  Well developed, well-nourished female and is in mild distress.   HEAD/NECK:    Reveals normocephalic/atraumatic.   CARDIOVASCULAR:   RRR, no murmur  LUNGS:   Normal work of breathing, no supplemental 02  EXTREMITIES:  Warm, no clubbing, cyanosis, or edema was noted.  NEUROLOGIC:    The patient was alert and oriented times four with normal language, attention, cognition and memory. Cranial nerve exam was grossly normal.    MUSCULOSKELETAL:    Moving all extremities  SKIN:  No obvious rashes lesions or erythema  PSY:  Appropriate affect and mood.    Test Results    Lab Results   Component Value Date    CREATININE 0.48 (L) 03/21/2022     Lab Results   Component Value Date    ALKPHOS 44 (L) 03/21/2022    BILITOT <0.2 (L) 03/21/2022    BILIDIR <0.10 03/17/2022    PROT 6.0 03/21/2022    ALBUMIN 3.2 (L) 03/21/2022    ALT 28 03/21/2022    AST 16 03/21/2022           Problem List    Principal Problem:    GI bleeding  Active Problems:    [name]    [name]    Pain    Iron deficiency anemia due to chronic blood loss  Severe protein-calorie malnutrition (CMS-HCC)    Lower abdominal pain    Generalized anxiety disorder with panic attacks    MDD (major depressive disorder)    Small intestinal bacterial overgrowth (SIBO)    Ileus (CMS-HCC)

## 2022-03-22 NOTE — Unmapped (Signed)
Internal Medicine (MEDW) Progress Note    Assessment & Plan:   [name] [name] is a 22 y.o. 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      Principal Problem:    GI bleeding  Active Problems:    [name]    [name]    Pain    Iron deficiency anemia due to chronic blood loss    Severe protein-calorie malnutrition (CMS-HCC)    Lower abdominal pain    Generalized anxiety disorder with panic attacks    MDD (major depressive disorder)    Small intestinal bacterial overgrowth (SIBO)    Ileus (CMS-HCC)  Resolved Problems:    * No resolved hospital problems. *    Active Problems    Acute on Chronic Abdominal Pain - Desmoid Tumors - Ileus (resolved) - Narcotic Bowel  Pain control continues to improve day by day, albeit slowly.  S/p IV Dilaudid 0.4 mg x 3, 0.5 mg x 1, and oxycodone 10 mg x 3.  Plan to decrease dose of IV Dilaudid as below and increase Butrans patch.  - Low residue diet  - Continue daily MiraLax  - Pain Psychology following  - Continue Cipro 500mg  BID x14 days (EOT 1/16) [needs to be oral not IV]  - Pain Regimen:   - s/p Lidocaine infusion x 3 days (EOT 1/11)   - Increase Butrans patch to 10 mcg (Flonase admin to skin prior) q7 days   - Decrease Dilaudid to 0.25 mg IV q6h PRN    - Dilaudid 3 mg oral tablet once nightly as needed    - Liquid Tylenol 1,000 mg q8hr  - Oxy 10mg  q6h PRN  - Gabapentin 200mg  at bedtime  - Baclofen 5mg  TID PRN     BRBPR I Acute on Chronic Anemia  Resolved. Known ulcer at ileoanal anastomosis noted on pouchoscopy during last admission. Re-bleed could be a result of sorafenib resumption, of which she has stopped indefinitely.  GI w/ no indication for repeat endoscopic eval at this time. Follow up on 1/22 w/ Dr. [name].  - continue trending Hgb daily   - Maintain active type and screen and adequate IV access with at least 2 large bore IV  - Transfuse Hgb <7    Severe Malnutrition 2/2 [name] and abdominal desmoid tumors   She is currently receiving TPN 5/7 days of the week and is being followed by Dr. [name], outpatient GI. Taking small amounts of oral intake.  - TPN consult  - Low residue diet     Anxiety - Panic Attack  Pt in talks with Adolescent Young Adult program at [facility]. Psychiatry following. Suspect episodes of pain and timing surrounding medications precipitate episodes of anxiety. 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  Found solace and doing sudoku and walking the halls with her father yesterday evening.  Feels anxious about increasing her Butrans patch but notes that doing this while inpatient is safe and it gives her comfort that we are doing this in the hospital and not at home.    No acute events overnight.     Objective:   Temp:  [36.7 ??C (98.1 ??F)] 36.7 ??C (98.1 ??F)  Pulse:  [73-98] 98  Resp:  [17-18] 17  BP: (114-178)/(62-66) 114/62  SpO2:  [96 %-98 %] 96 %    Gen: NAD, lying in bed, soft spoken  HENT: atraumatic, normocephalic  Heart: regular rate, regular rhythm  Lungs: CTAB anteriorly, no crackles or wheezes. No increased WOB on RA   Abdomen: soft, distention stable. Active bowel sounds. mildly TTP    Gus Lockie Pares, MD   [facility] Internal Medicine PGY-1

## 2022-03-22 NOTE — Unmapped (Signed)
A/O x 4, afebrile, VSS on RA, denies pain at this time or PRN meds given for pain (see MAR). IV clean, dry, intact, and flushed per protocol. Falls precautions maintained. Call bell and bedside table within reach, bed in lowest position and locked. No falls or injuries noted this shift.    Problem: Adult Inpatient Plan of Care  Goal: Plan of Care Review  Outcome: Ongoing - Unchanged  Goal: Patient-Specific Goal (Individualized)  Outcome: Ongoing - Unchanged  Goal: Absence of Hospital-Acquired Illness or Injury  Outcome: Ongoing - Unchanged  Intervention: Identify and Manage Fall Risk  Recent Flowsheet Documentation  Taken 03/21/2022 1200 by Johnna Acosta, RN  Safety Interventions: environmental modification  Intervention: Prevent Infection  Recent Flowsheet Documentation  Taken 03/21/2022 1200 by Johnna Acosta, RN  Infection Prevention: environmental surveillance performed  Goal: Optimal Comfort and Wellbeing  Outcome: Ongoing - Unchanged  Goal: Readiness for Transition of Care  Outcome: Ongoing - Unchanged  Goal: Rounds/Family Conference  Outcome: Ongoing - Unchanged

## 2022-03-22 NOTE — Unmapped (Signed)
Alert,oriented x 4. VSS-hypertensive with pain. Right abd pain responsive to prn Percocet and Dilaudid. Pain Score 8-9 prior to analgesics and 4 after. Reports normal bowel movements, urination and appetite. TPN/Lipids hung at 2400. Mother at bedside.    Problem: Adult Inpatient Plan of Care  Goal: Plan of Care Review  03/22/2022 0437 by Alfredo Bach, RN  Outcome: Progressing  03/22/2022 0436 by Alfredo Bach, RN  Outcome: Progressing  Goal: Patient-Specific Goal (Individualized)  03/22/2022 0437 by Alfredo Bach, RN  Outcome: Progressing  03/22/2022 0436 by Alfredo Bach, RN  Outcome: Progressing  Goal: Absence of Hospital-Acquired Illness or Injury  03/22/2022 0437 by Alfredo Bach, RN  Outcome: Progressing  03/22/2022 0436 by Alfredo Bach, RN  Outcome: Progressing  Intervention: Identify and Manage Fall Risk  Recent Flowsheet Documentation  Taken 03/21/2022 2000 by Alfredo Bach, RN  Safety Interventions: family at bedside  Goal: Optimal Comfort and Wellbeing  03/22/2022 0437 by Alfredo Bach, RN  Outcome: Progressing  03/22/2022 0436 by Alfredo Bach, RN  Outcome: Progressing  Goal: Readiness for Transition of Care  03/22/2022 0437 by Alfredo Bach, RN  Outcome: Progressing  03/22/2022 0436 by Alfredo Bach, RN  Outcome: Progressing  Goal: Rounds/Family Conference  03/22/2022 0437 by Alfredo Bach, RN  Outcome: Progressing  03/22/2022 0436 by Alfredo Bach, RN  Outcome: Progressing     Problem: Adult Inpatient Plan of Care  Goal: Optimal Comfort and Wellbeing  03/22/2022 0437 by Alfredo Bach, RN  Outcome: Progressing  03/22/2022 0436 by Alfredo Bach, RN  Outcome: Progressing     Problem: Pain Acute  Goal: Optimal Pain Control and Function  03/22/2022 0437 by Alfredo Bach, RN  Outcome: Progressing  03/22/2022 0436 by Alfredo Bach, RN  Outcome: Progressing

## 2022-03-23 LAB — CBC
HEMATOCRIT: 25.1 % — ABNORMAL LOW (ref 34.0–44.0)
HEMOGLOBIN: 8.1 g/dL — ABNORMAL LOW (ref 11.3–14.9)
MEAN CORPUSCULAR HEMOGLOBIN CONC: 32.5 g/dL (ref 32.0–36.0)
MEAN CORPUSCULAR HEMOGLOBIN: 26.6 pg (ref 25.9–32.4)
MEAN CORPUSCULAR VOLUME: 81.9 fL (ref 77.6–95.7)
MEAN PLATELET VOLUME: 9.2 fL (ref 6.8–10.7)
PLATELET COUNT: 203 10*9/L (ref 150–450)
RED BLOOD CELL COUNT: 3.06 10*12/L — ABNORMAL LOW (ref 3.95–5.13)
RED CELL DISTRIBUTION WIDTH: 15.3 % — ABNORMAL HIGH (ref 12.2–15.2)
WBC ADJUSTED: 7.8 10*9/L (ref 3.6–11.2)

## 2022-03-23 LAB — COMPREHENSIVE METABOLIC PANEL
ALBUMIN: 3.1 g/dL — ABNORMAL LOW (ref 3.4–5.0)
ALKALINE PHOSPHATASE: 43 U/L — ABNORMAL LOW (ref 46–116)
ALT (SGPT): 21 U/L (ref 10–49)
ANION GAP: 5 mmol/L (ref 5–14)
AST (SGOT): 21 U/L (ref <=34)
BILIRUBIN TOTAL: <0.2 mg/dL — ABNORMAL LOW (ref 0.3–1.2)
BLOOD UREA NITROGEN: 14 mg/dL (ref 9–23)
BUN / CREAT RATIO: 28
CALCIUM: 8.8 mg/dL (ref 8.7–10.4)
CHLORIDE: 111 mmol/L — ABNORMAL HIGH (ref 98–107)
CO2: 27 mmol/L (ref 20.0–31.0)
CREATININE: 0.5 mg/dL — ABNORMAL LOW
EGFR CKD-EPI (2021) FEMALE: >90 mL/min/{1.73_m2} (ref >=60)
GLUCOSE RANDOM: 106 mg/dL (ref 70–179)
POTASSIUM: 3.9 mmol/L (ref 3.4–4.8)
PROTEIN TOTAL: 5.6 g/dL — ABNORMAL LOW (ref 5.7–8.2)
SODIUM: 143 mmol/L (ref 135–145)

## 2022-03-23 LAB — MAGNESIUM: MAGNESIUM: 2.1 mg/dL (ref 1.6–2.6)

## 2022-03-23 LAB — PHOSPHORUS: PHOSPHORUS: 4 mg/dL (ref 2.4–5.1)

## 2022-03-23 LAB — HEMOGLOBIN AND HEMATOCRIT, BLOOD
HEMATOCRIT: 27.6 % — ABNORMAL LOW (ref 34.0–44.0)
HEMATOCRIT: 25.6 % — ABNORMAL LOW (ref 34.0–44.0)
HEMOGLOBIN: 8.9 g/dL — ABNORMAL LOW (ref 11.3–14.9)
HEMOGLOBIN: 8.3 g/dL — ABNORMAL LOW (ref 11.3–14.9)

## 2022-03-23 MED ADMIN — fat emulsion 20 % with fish oil (SMOFlipid) infusion 250 mL: 250 mL | INTRAVENOUS | @ 05:00:00 | Stop: 2022-03-23

## 2022-03-23 MED ADMIN — oxyCODONE (ROXICODONE) immediate release tablet 10 mg: 10 mg | ORAL | @ 22:00:00 | Stop: 2022-03-25

## 2022-03-23 MED ADMIN — HYDROmorphone (PF) (DILAUDID) injection 0.25 mg: .25 mg | INTRAVENOUS | @ 06:00:00 | Stop: 2022-03-29

## 2022-03-23 MED ADMIN — acetaminophen (TYLENOL) tablet 1,000 mg: 1000 mg | ORAL | @ 11:00:00

## 2022-03-23 MED ADMIN — ciprofloxacin HCl (CIPRO) tablet 500 mg: 500 mg | ORAL | @ 02:00:00 | Stop: 2022-04-08

## 2022-03-23 MED ADMIN — HYDROmorphone (DILAUDID) tablet 2 mg: 2 mg | ORAL | @ 17:00:00 | Stop: 2022-04-06

## 2022-03-23 MED ADMIN — escitalopram oxalate (LEXAPRO) tablet 5 mg: 5 mg | ORAL | @ 14:00:00 | Stop: 2022-03-23

## 2022-03-23 MED ADMIN — pregabalin (LYRICA) capsule 25 mg: 25 mg | ORAL | @ 14:00:00

## 2022-03-23 MED ADMIN — hydrOXYzine (ATARAX) tablet 50 mg: 50 mg | ORAL | @ 21:00:00

## 2022-03-23 MED ADMIN — oxyCODONE (ROXICODONE) immediate release tablet 10 mg: 10 mg | ORAL | @ 03:00:00 | Stop: 2022-03-25

## 2022-03-23 MED ADMIN — polyethylene glycol (MIRALAX) packet 17 g: 17 g | ORAL | @ 14:00:00

## 2022-03-23 MED ADMIN — hydrOXYzine (ATARAX) tablet 50 mg: 50 mg | ORAL | @ 14:00:00

## 2022-03-23 MED ADMIN — pantoprazole (Protonix) injection 40 mg: 40 mg | INTRAVENOUS | @ 23:00:00

## 2022-03-23 MED ADMIN — HYDROmorphone (PF) (DILAUDID) injection 0.25 mg: .25 mg | INTRAVENOUS | @ 19:00:00 | Stop: 2022-03-29

## 2022-03-23 MED ADMIN — acetaminophen (TYLENOL) tablet 1,000 mg: 1000 mg | ORAL | @ 02:00:00

## 2022-03-23 MED ADMIN — acetaminophen (TYLENOL) tablet 1,000 mg: 1000 mg | ORAL | @ 19:00:00

## 2022-03-23 MED ADMIN — OLANZapine zydis (ZyPREXA) disintegrating tablet 7.5 mg: 7.5 mg | ORAL | @ 02:00:00

## 2022-03-23 MED ADMIN — oxyCODONE (ROXICODONE) immediate release tablet 10 mg: 10 mg | ORAL | @ 14:00:00 | Stop: 2022-03-25

## 2022-03-23 MED ADMIN — Parenteral Nutrition (CENTRAL): INTRAVENOUS | @ 05:00:00 | Stop: 2022-03-24

## 2022-03-23 MED ADMIN — hydrOXYzine (ATARAX) tablet 50 mg: 50 mg | ORAL | @ 03:00:00

## 2022-03-23 MED ADMIN — gabapentin (NEURONTIN) capsule 200 mg: 200 mg | ORAL | @ 02:00:00

## 2022-03-23 MED ADMIN — cholecalciferol (vitamin D3 25 mcg (1,000 units)) tablet 25 mcg: 25 ug | ORAL | @ 14:00:00

## 2022-03-23 MED ADMIN — ciprofloxacin HCl (CIPRO) tablet 500 mg: 500 mg | ORAL | @ 14:00:00 | Stop: 2022-04-08

## 2022-03-23 MED ADMIN — HYDROmorphone (PF) (DILAUDID) injection 0.25 mg: .25 mg | INTRAVENOUS | @ 12:00:00 | Stop: 2022-03-29

## 2022-03-23 MED ADMIN — HYDROmorphone (PF) (DILAUDID) injection 0.25 mg: .25 mg | INTRAVENOUS | @ 23:00:00 | Stop: 2022-03-23

## 2022-03-23 MED ADMIN — famotidine (PEPCID) tablet 20 mg: 20 mg | ORAL | @ 02:00:00

## 2022-03-23 MED ADMIN — mirtazapine (REMERON) tablet 7.5 mg: 7.5 mg | ORAL | @ 02:00:00

## 2022-03-23 NOTE — Unmapped (Signed)
Alert,oriented x 4. VSS. Right abd pain responsive to prn Percocet and Dilaudid. Pain Score 7-8 prior to analgesics and 4 after. Clarified new schedule for prn's after some confusion. TPN/Lipids hung at 2400. Mother at bedside.    Problem: Adult Inpatient Plan of Care  Goal: Plan of Care Review  03/23/2022 0437 by Alfredo Bach, RN  Outcome: Progressing  Goal: Patient-Specific Goal (Individualized)  03/23/2022 0437 by Alfredo Bach, RN  Outcome: Progressing  Goal: Absence of Hospital-Acquired Illness or Injury  03/23/2022 0437 by Alfredo Bach, RN  Outcome: Progressing  Intervention: Identify and Manage Fall Risk  Recent Flowsheet Documentation  Taken 03/22/2022 2000 by Alfredo Bach, RN  Safety Interventions: family at bedside  Goal: Optimal Comfort and Wellbeing  03/23/2022 0437 by Alfredo Bach, RN  Outcome: Progressing  Goal: Readiness for Transition of Care  03/23/2022 0437 by Alfredo Bach, RN  Outcome: Progressing  Goal: Rounds/Family Conference  03/23/2022 0437 by Alfredo Bach, RN  Outcome: Progressing  Problem: Adult Inpatient Plan of Care  Goal: Optimal Comfort and Wellbeing  03/23/2022 0437 by Alfredo Bach, RN  Outcome: Progressing  Problem: Pain Acute  Goal: Optimal Pain Control and Function  03/23/2022 0437 by Alfredo Bach, RN  Outcome: Progressing

## 2022-03-23 NOTE — Unmapped (Addendum)
A/O x 4, afebrile, VSS on RA, denies pain at this time or PRN meds given for pain (see MAR). IV clean, dry, intact, and flushed per protocol. Falls precautions maintained. Call bell and bedside table within reach, bed in lowest position and locked. No falls or injuries noted this shift. Patient had large bloody BM and messaged Dr.Goldbeck about BM. MD placed order for hgb every 4 hours, Protonix IV BID and one time of dilaudid 0.25 mg.   Vitals:    03/23/22 0859 03/23/22 1035 03/23/22 1600 03/23/22 1719   BP: 115/46 110/51 123/69 118/63   Pulse: 93 95 91 95   Resp: 18 18 18 16    Temp: 36.9 ??C (98.4 ??F) 36.9 ??C (98.4 ??F) 36.9 ??C (98.4 ??F) 36.7 ??C (98.1 ??F)   TempSrc: Oral Oral Oral Axillary   SpO2: 97% 99% 99%    Weight:       Height:          Problem: Adult Inpatient Plan of Care  Goal: Plan of Care Review  Outcome: Ongoing - Unchanged  Goal: Patient-Specific Goal (Individualized)  Outcome: Ongoing - Unchanged  Goal: Absence of Hospital-Acquired Illness or Injury  Outcome: Ongoing - Unchanged  Intervention: Identify and Manage Fall Risk  Recent Flowsheet Documentation  Taken 03/23/2022 0730 by Johnna Acosta, RN  Safety Interventions:   fall reduction program maintained   family at bedside  Intervention: Prevent and Manage VTE (Venous Thromboembolism) Risk  Recent Flowsheet Documentation  Taken 03/23/2022 0730 by Johnna Acosta, RN  Anti-Embolism Intervention: (active bleed) Other (Comment)  Intervention: Prevent Infection  Recent Flowsheet Documentation  Taken 03/23/2022 0730 by Johnna Acosta, RN  Infection Prevention: hand hygiene promoted  Goal: Optimal Comfort and Wellbeing  Outcome: Ongoing - Unchanged  Goal: Readiness for Transition of Care  Outcome: Ongoing - Unchanged  Goal: Rounds/Family Conference  Outcome: Ongoing - Unchanged

## 2022-03-23 NOTE — Unmapped (Signed)
Department of Anesthesiology  Pain Medicine Division    Chronic Pain Followup Inpatient Follow-Up Note    Requesting Attending Physician:  [name], MD  Service Requesting Consult:  [facility] (MDW)    Assessment/Recommendations:  22 y.o. female  with [name] (familial adenomatosis polyposis) s/p proctocolectomy with ileoanal anastomosis, desmoid tumors, anemia, severe protein calorie malnutrition that presented to [facility] with GI bleed and anemia. Follows with [name] at [facility].    Our service is re-engaging with [name]'s care as she has had an exacerbation in her abdominal pain after discontinuation of her hydromorphone IV on 1/5, the exacerbation of abdominal pain over the weekend was likely multifactorial in etiology - there were concerns of possible, transient SBO at a site of tethering in the small intestine on  CT and an ongoing, opioid-induced ileus. This exacerbation in pain necessitating reinitiation of IV hydromorphone.  She states that her pain is currently appropriately controlled at this time.  However, given concerns of her ongoing ileus and escalation of opioids; the primary team had wanted our team to reengage for assistance in reducing opioid requirements.    She did well with a lidocaine infusion at 1.5 mg/kg/hr over 72 hours.     Her primary pain generators include acute abdominal pain from possible transient small bowel obstruction, chronic pain in the setting of multiple abdominal surgeries and desmoid tumors.    Interval: Pain stable, tolerating medications without adverse effects.     Recommendations:  -The chronic pain service is a consult service and does not place orders, just makes recommendations (except ketamine and lidocaine infusions)   -Please evaluate all patients on opioids for appropriateness of prescribing narcan at discharge.  The chronic pain service can assist with this.  Nasal narcan is covered by most insurances.  -Recommendations given apply to the current hospitalization and do not reflect long term recommendations.    Changes to current analgesic regimen:  -Start pregabalin 25 mg oral capsule twice daily scheduled   -Adjust hydromorphone oral tablet order to reflect: hydromorphone 2 mg every 6 hours as needed for breakthrough pain (use oral hydromorphone before IV hydromorphone)     Continue without changes:  -Continue Butrans 10 mcg/hr patch every 7 days scheduled (please spray flonase onto site of patch application in order to prevent hypersensitivity that is adhesive-related; please ensure that patch alternates sites)   -Continue hydromorphone 0.25 mg IV every 6 hours as needed for refractory, breakthrough pain (goal of weaning to off over the course of the next few days as abdominal pain improves)  -Continue acetaminophen 1000 mg oral solution every 8 hours scheduled  -Continue diclofenac 1% gel topically 4 times a day scheduled  -Continue gabapentin 200 mg oral capsule nightly scheduled  -Continue baclofen 5 mg oral tablet 3 times a day as needed for muscle spasms  -Continue oxycodone 10 mg oral tablet every 4 hours as needed for moderate to severe pain  -Continue naloxone intranasal as needed in the event of opioid induced respiratory depression or oversedation  -Continue engaging with pain psychology as needed (reinforced to patient that she will have to advocate for herself and let us know if she would like to set up an appointment)    Home MME: 135  Current MME: 85    We will continue to follow.    Naloxone Rx at discharge?  Is patient on opioids? Yes.  1)Is dose >50MME?  Yes.  2) Is patient prescribed a benzodiazepine (  w opioids)? No.  3)Hx of overdose?  No.  4) Hx of substance use disorder? No.  5) Opioids likely to last greater than a week after discharge? Yes.    If yes to 2 or more, prescribe naloxone at discharge.  Nasal narcan for most insured (Nasal narcan 4mg /actuation, prescribe 1 kit, instructions at [email or web address]).  For uninsured, chronic pain can work to assist in finding an option.  OTC nasal narcan now available at most pharmacies for around $45.    Interim History  There were no Acute Events Overnight.  The patient is obtaining adequate pain relief on current medication regimen and feels that their pain is Somewhat controlled    Analgesia Evaluation:  Pain at minimum: 4/10  Pain at maximum: 8/10    Current pain medication regimen (including how frequent PRN's were used):  IV HM 0.5 x   IV HM 0.4 x 3  PO Oxy 10 x3      Inpatient Medications  Current Facility-Administered Medications   Medication Dose Route Frequency Provider Last Rate Last Admin    acetaminophen (TYLENOL) tablet 1,000 mg  1,000 mg Oral Q8H Cleotis Nipper, MD   1,000 mg at 03/23/22 0534    baclofen (LIORESAL) tablet 5 mg  5 mg Oral TID PRN Linna Hoff, MD   5 mg at 03/22/22 1639    buprenorphine 10 mcg/hour transdermal patch 1 patch  1 patch Transdermal Q7 Days Cleotis Nipper, MD   1 patch at 03/22/22 1053    cholecalciferol (vitamin D3 25 mcg (1,000 units)) tablet 25 mcg  25 mcg Oral Daily Cleotis Nipper, MD   25 mcg at 03/22/22 0904    ciprofloxacin HCl (CIPRO) tablet 500 mg  500 mg Oral Q12H SCH Durene Romans, MD   500 mg at 03/22/22 2102    clobetasol (TEMOVATE) 0.05 % ointment   Topical BID PRN Linna Hoff, MD        diclofenac sodium (VOLTAREN) 1 % gel 2 g  2 g Topical QID Linna Hoff, MD   2 g at 03/20/22 1109    escitalopram oxalate (LEXAPRO) tablet 5 mg  5 mg Oral Daily Cleotis Nipper, MD   5 mg at 03/22/22 0904    famotidine (PEPCID) tablet 20 mg  20 mg Oral Nightly Achille Rich, MD   20 mg at 03/22/22 2102    Parenteral Nutrition (CENTRAL)   Intravenous Continuous Cleotis Nipper, MD 100 mL/hr at 03/23/22 0017 New Bag at 03/23/22 0017    And    fat emulsion 20 % with fish oil (SMOFlipid) infusion 250 mL  250 mL Intravenous Continuous Cleotis Nipper, MD 20.8 mL/hr at 03/23/22 0018 250 mL at 03/23/22 0018    [START ON 03/24/2022] Parenteral Nutrition (CENTRAL)   Intravenous Continuous Achille Rich, MD        And    Melene Muller ON 03/24/2022] fat emulsion 20 % with fish oil (SMOFlipid) infusion 250 mL  250 mL Intravenous Continuous Goldbeck, Lauren D, MD        fluticasone propionate (FLONASE) 50 mcg/actuation nasal spray 1 spray  1 spray Topical Daily PRN Muthukkumar, Rashmi, MD        gabapentin (NEURONTIN) capsule 200 mg  200 mg Oral Nightly Linna Hoff, MD   200 mg at 03/22/22 2102    HYDROmorphone (PF) (DILAUDID) injection 0.25 mg  0.25 mg Intravenous Q6H PRN Cleotis Nipper, MD   0.25 mg at 03/23/22  1610    hydrOXYzine (ATARAX) tablet 50 mg  50 mg Oral Q6H PRN Cleotis Nipper, MD   50 mg at 03/22/22 2213    hyoscyamine (LEVSIN) tablet 125 mcg  125 mcg Oral Q4H PRN Hollie Beach P, DO        lactated Ringers infusion  10 mL/hr Intravenous Continuous Somtochukwu Woollard A, MD        melatonin tablet 3 mg  3 mg Oral Nightly PRN Linna Hoff, MD        mirtazapine (REMERON) tablet 7.5 mg  7.5 mg Oral Nightly Alan Ripper, DO   7.5 mg at 03/22/22 2102    naloxone [facility]) injection 0.4 mg  0.4 mg Intravenous Once PRN Linna Hoff, MD        OLANZapine (ZyPREXA) tablet 1.25 mg  1.25 mg Oral Daily PRN Muthukkumar, Rashmi, MD        OLANZapine zydis (ZyPREXA) disintegrating tablet 7.5 mg  7.5 mg Oral Nightly Goldbeck, Lauren D, MD   7.5 mg at 03/22/22 2102    ondansetron (ZOFRAN-ODT) disintegrating tablet 8 mg  8 mg Oral Q12H PRN Hollie Beach P, DO   8 mg at 03/21/22 0913    oxyCODONE (ROXICODONE) immediate release tablet 10 mg  10 mg Oral Q4H PRN Achille Rich, MD   10 mg at 03/22/22 2210    phenol (CHLORASEPTIC) 1.4 % spray 2 spray  2 spray Mucous Membrane Q2H PRN Goldbeck, Eliott Nine, MD        polyethylene glycol (MIRALAX) packet 17 g  17 g Oral Daily Cleotis Nipper, MD   17 g at 03/22/22 0905    simethicone (MYLICON) chewable tablet 80 mg  80 mg Oral Q6H PRN Achille Rich, MD   80 mg at 03/14/22 1810         Objective:     Vital Signs    Temp:  [36.7 ??C (98.1 ??F)-36.9 ??C (98.4 ??F)] 36.9 ??C (98.4 ??F)  Pulse:  [77-98] 77  Resp:  [16-17] 16  BP: (114-145)/(58-62) 125/58  MAP (mmHg):  [75-82] 75  SpO2:  [96 %-98 %] 96 %      Physical Exam    GENERAL:  Well developed, well-nourished female and is in mild distress.   HEAD/NECK:    Reveals normocephalic/atraumatic.   CARDIOVASCULAR:   RRR, no murmur  LUNGS:   Normal work of breathing, no supplemental 02  EXTREMITIES:  Warm, no clubbing, cyanosis, or edema was noted.  NEUROLOGIC:    The patient was alert and oriented times four with normal language, attention, cognition and memory. Cranial nerve exam was grossly normal.    MUSCULOSKELETAL:    Moving all extremities  SKIN:  No obvious rashes lesions or erythema  PSY:  Appropriate affect and mood.    Test Results    Lab Results   Component Value Date    CREATININE 0.50 (L) 03/23/2022     Lab Results   Component Value Date    ALKPHOS 43 (L) 03/23/2022    BILITOT <0.2 (L) 03/23/2022    BILIDIR <0.10 03/17/2022    PROT 5.6 (L) 03/23/2022    ALBUMIN 3.1 (L) 03/23/2022    ALT 21 03/23/2022    AST 21 03/23/2022           Problem List    Principal Problem:    GI bleeding  Active Problems:    [name]    [name]    Pain  Iron deficiency anemia due to chronic blood loss    Severe protein-calorie malnutrition (CMS-HCC)    Lower abdominal pain    Generalized anxiety disorder with panic attacks    MDD (major depressive disorder)    Small intestinal bacterial overgrowth (SIBO)    Ileus (CMS-HCC)

## 2022-03-24 LAB — HEMOGLOBIN AND HEMATOCRIT, BLOOD
HEMATOCRIT: 24.6 % — ABNORMAL LOW (ref 34.0–44.0)
HEMATOCRIT: 24.8 % — ABNORMAL LOW (ref 34.0–44.0)
HEMATOCRIT: 25.9 % — ABNORMAL LOW (ref 34.0–44.0)
HEMATOCRIT: 25.3 % — ABNORMAL LOW (ref 34.0–44.0)
HEMOGLOBIN: 8.2 g/dL — ABNORMAL LOW (ref 11.3–14.9)
HEMOGLOBIN: 7.9 g/dL — ABNORMAL LOW (ref 11.3–14.9)
HEMOGLOBIN: 8.3 g/dL — ABNORMAL LOW (ref 11.3–14.9)
HEMOGLOBIN: 8.2 g/dL — ABNORMAL LOW (ref 11.3–14.9)

## 2022-03-24 MED ADMIN — ciprofloxacin HCl (CIPRO) tablet 500 mg: 500 mg | ORAL | @ 13:00:00 | Stop: 2022-03-24

## 2022-03-24 MED ADMIN — HYDROmorphone (PF) (DILAUDID) injection 0.25 mg: .25 mg | INTRAVENOUS | @ 12:00:00 | Stop: 2022-03-24

## 2022-03-24 MED ADMIN — hydrOXYzine (ATARAX) tablet 50 mg: 50 mg | ORAL

## 2022-03-24 MED ADMIN — Parenteral Nutrition (CENTRAL): INTRAVENOUS | @ 06:00:00 | Stop: 2022-03-25

## 2022-03-24 MED ADMIN — HYDROmorphone (PF) (DILAUDID) injection 0.15 mg: .15 mg | INTRAVENOUS | @ 23:00:00 | Stop: 2022-03-26

## 2022-03-24 MED ADMIN — HYDROmorphone (DILAUDID) tablet 2 mg: 2 mg | ORAL | @ 22:00:00 | Stop: 2022-03-29

## 2022-03-24 MED ADMIN — cholecalciferol (vitamin D3 25 mcg (1,000 units)) tablet 25 mcg: 25 ug | ORAL | @ 13:00:00

## 2022-03-24 MED ADMIN — pantoprazole (Protonix) injection 40 mg: 40 mg | INTRAVENOUS | @ 13:00:00

## 2022-03-24 MED ADMIN — HYDROmorphone (PF) (DILAUDID) injection 0.25 mg: .25 mg | INTRAVENOUS | @ 02:00:00 | Stop: 2022-03-29

## 2022-03-24 MED ADMIN — fat emulsion 20 % with fish oil (SMOFlipid) infusion 250 mL: 250 mL | INTRAVENOUS | @ 05:00:00 | Stop: 2022-03-24

## 2022-03-24 MED ADMIN — acetaminophen (TYLENOL) tablet 1,000 mg: 1000 mg | ORAL | @ 02:00:00

## 2022-03-24 MED ADMIN — polyethylene glycol (MIRALAX) packet 17 g: 17 g | ORAL | @ 13:00:00

## 2022-03-24 MED ADMIN — HYDROmorphone (PF) (DILAUDID) injection 0.15 mg: .15 mg | INTRAVENOUS | @ 19:00:00 | Stop: 2022-03-26

## 2022-03-24 MED ADMIN — famotidine (PEPCID) tablet 20 mg: 20 mg | ORAL | @ 02:00:00

## 2022-03-24 MED ADMIN — ciprofloxacin HCl (CIPRO) tablet 500 mg: 500 mg | ORAL | @ 02:00:00 | Stop: 2022-04-08

## 2022-03-24 MED ADMIN — pregabalin (LYRICA) capsule 25 mg: 25 mg | ORAL | @ 18:00:00

## 2022-03-24 MED ADMIN — gabapentin (NEURONTIN) capsule 200 mg: 200 mg | ORAL | @ 02:00:00

## 2022-03-24 MED ADMIN — oxyCODONE (ROXICODONE) immediate release tablet 10 mg: 10 mg | ORAL | @ 03:00:00 | Stop: 2022-03-25

## 2022-03-24 MED ADMIN — mirtazapine (REMERON) tablet 7.5 mg: 7.5 mg | ORAL | @ 02:00:00

## 2022-03-24 MED ADMIN — pregabalin (LYRICA) capsule 25 mg: 25 mg | ORAL | @ 02:00:00

## 2022-03-24 MED ADMIN — hydrOXYzine (ATARAX) tablet 50 mg: 50 mg | ORAL | @ 18:00:00

## 2022-03-24 MED ADMIN — OLANZapine zydis (ZyPREXA) disintegrating tablet 7.5 mg: 7.5 mg | ORAL | @ 02:00:00

## 2022-03-24 MED ADMIN — HYDROmorphone (DILAUDID) tablet 2 mg: 2 mg | ORAL | @ 18:00:00 | Stop: 2022-03-29

## 2022-03-24 MED ADMIN — acetaminophen (TYLENOL) tablet 1,000 mg: 1000 mg | ORAL | @ 18:00:00

## 2022-03-24 MED ADMIN — oxyCODONE (ROXICODONE) immediate release tablet 10 mg: 10 mg | ORAL | @ 14:00:00 | Stop: 2022-03-25

## 2022-03-24 MED ADMIN — acetaminophen (TYLENOL) tablet 1,000 mg: 1000 mg | ORAL | @ 12:00:00

## 2022-03-24 MED ADMIN — pregabalin (LYRICA) capsule 25 mg: 25 mg | ORAL | @ 13:00:00 | Stop: 2022-03-24

## 2022-03-24 NOTE — Unmapped (Signed)
Internal Medicine (MEDW) Progress Note    Assessment & Plan:   [name] [name] is a 22 y.o. female whose presentation is complicated by [name] [name] ([name]) s/p proctocolectomy w/ileoanal anastomosis, desmoid tumors (previously on sorafenib), anemia, on TPN, anxiety/nausea, recent admission for LGIB (anastomotic ulcer, anal fissures), presenting with BRBPR and recurrent abdominal pain.      Principal Problem:    GI bleeding  Active Problems:    [name] [name]    [name]    Pain    Iron deficiency anemia due to chronic blood loss    Severe protein-calorie malnutrition (CMS-HCC)    Lower abdominal pain    Generalized anxiety disorder with panic attacks    MDD (major depressive disorder)    Small intestinal bacterial overgrowth (SIBO)    Ileus (CMS-HCC)  Resolved Problems:    * No resolved hospital problems. *    Active Problems    Acute on Chronic Abdominal Pain - Desmoid Tumors - Ileus (resolved) - Narcotic Bowel  Plan to hopefully stop IV Dilaudid tomorrow however she developed severely worsening abdominal pain this evening with associated bloody bowel movements.  Hemoglobin stable.  Will give an extra one-time dose of IV Dilaudid with this acutely worsening pain.  Hopefully will be able to continue to taper down IV use as this is a barrier to her discharge.  Started Lyrica today per pain management.  - Low residue diet  - Continue daily MiraLax  - Pain Psychology following  - Continue Cipro 500mg  BID x14 days (EOT 1/16) [needs to be oral not IV]  - Pain Regimen:   - Start lyrica 25mg  BID    - s/p Lidocaine infusion x 3 days (EOT 1/11)   - Continue Butrans patch 10 mcg (Flonase admin to skin prior) q7 days   - Decrease Dilaudid to 0.25 mg IV q6h PRN (giving extra dose today iso worsened abdominal pain with BRBPR)   - Dilaudid 3 mg oral tablet once nightly as needed    - Liquid Tylenol 1,000 mg q8hr  - Oxy 10mg  q6h PRN  - Gabapentin 200mg  at bedtime  - Baclofen 5mg  TID PRN     BRBPR I Acute on Chronic Anemia  Unfortunately developed two burgundy colored bowel movements this evening, associated severe abdominal pain with some lightheadedness.  Vital signs were stable and hemoglobin improved from prior.  Will continue to trend her hemoglobin, discussed with GI no indication right now to urgently image however would consider CT if she were to become hemodynamically unstable or her hemoglobin begins to drop.  Discontinued her Lexapro given potential risk of platelet dysfunction and was recently started.   - Stop lexapro   - trending Hgb q4h, decrease frequency if stable  - Maintain active type and screen and adequate IV access with at least 2 large bore IV  - Transfuse Hgb <7    Severe Malnutrition 2/2 [name] and abdominal desmoid tumors   She is currently receiving TPN 5/7 days of the week and is being followed by Dr. [name], outpatient GI. Taking small amounts of oral intake.  - TPN consult  - Low residue diet     Anxiety - Panic Attacks  Pt in talks with Adolescent Young Adult program at [facility]. Psychiatry following. Suspect episodes of pain and timing surrounding medications precipitate episodes of anxiety. Discharge Psych plan below:  - Continue Remeron 7.5 nightly  - HOLDING Lexapro 5 mg daily given recurrent BRBPR as above  - Continue gabapentin 200 mg  nightly  - Psychiatry consult, appreciate recommendations  - Increased to 50 mg PO Atarax q6 PRN  - PRN Zyprexa     Psych DC recs:  -- START Atarax 50 mg q8H prn anxiety   -- CONTINUE Lexapro 5 mg daily (holding for now as above)   -- STOP Olanzapine 1.25 mg as needed daily for agitation   -- CONTINUE home Olanzapine 7.5 mg at bedtime  -- CONTINUE home Mirtazapine 7.5 mg at bedtime  -- CONTINUE home Gabapentin 200 mg nightly     Chronic Problems  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  Barrier to discharge remains use of IV opioids.    Team Contact Information:   Primary Team: Internal Medicine (MEDW)  Primary Resident: Achille Rich, MD  Resident's Pager: [phone] (Gen MedW Senior Resident)    Interval History:     A little more sleepy this morning after starting Lyrica.  Worsened abdominal pain this evening with worsened abdominal distention after a large burgundy colored bowel movement.  Hemoglobin was stable/improved from prior with stable vital signs.    Objective:   Temp:  [36.7 ??C (98.1 ??F)-36.9 ??C (98.4 ??F)] 36.7 ??C (98.1 ??F)  Pulse:  [77-95] 95  Resp:  [16-18] 16  BP: (110-125)/(46-69) 118/63  SpO2:  [96 %-99 %] 99 %    Gen: NAD, sitting upright in bed, appears tired   HENT: atraumatic, normocephalic  Heart: tachycardic, regular rhythm   Lungs: No increased WOB on RA   Abdomen: soft, mildly distended. TTP without guarding

## 2022-03-24 NOTE — Unmapped (Signed)
Internal Medicine (MEDW) Progress Note    Assessment & Plan:   [name] [name] is a 22 y.o. female whose presentation is complicated by [name] [name] ([name]) s/p proctocolectomy w/ileoanal anastomosis, desmoid tumors (previously on sorafenib), anemia, on TPN, anxiety/nausea, recent admission for LGIB (anastomotic ulcer, anal fissures), presenting with BRBPR and recurrent abdominal pain.      Principal Problem:    GI bleeding  Active Problems:    [name] [name]    [name]    Pain    Iron deficiency anemia due to chronic blood loss    Severe protein-calorie malnutrition (CMS-HCC)    Lower abdominal pain    Generalized anxiety disorder with panic attacks    MDD (major depressive disorder)    Small intestinal bacterial overgrowth (SIBO)    Ileus (CMS-HCC)  Resolved Problems:    * No resolved hospital problems. *    Active Problems    Acute on Chronic Abdominal Pain - Desmoid Tumors - Ileus (resolved) - Narcotic Bowel  Pain under decent control this AM compared to prior despite episode of hematochezia yesterday evening. Decreasing IV Dilaudid dose as below today with plan to stop tomorrow. Increasing Lyrica, stopping gabapentin, and adjusting frequency of oral Dilaudid as below. Additionally endorsing a new pain this morning surrounding her anterior ribs bilaterally that is worse with deep breaths. I wonder if it is related to constipation vs gas buildup. She will finish her cipro tomorrow for SIBO.  - Low residue diet  - Continue daily MiraLax  - Continue Cipro 500mg  BID x14 days (EOT 1/16) [needs to be oral not IV]  - Pain Regimen:   - Increase Lyrica to 25 mg TID    - s/p Lidocaine infusion x 3 days (EOT 1/11)   - Continue Butrans patch 10 mcg (Flonase admin to skin prior) q7 days   - Decrease Dilaudid to 0.15 mg IV q4h PRN    - Dilaudid 2 mg oral q4 hours PRN  - Liquid Tylenol 1,000 mg q8hr  - Oxy 10mg  q6h PRN  - Discontinue Gabapentin 200mg  at bedtime  - Baclofen 5mg  TID PRN     BRBPR I Acute on Chronic Anemia  Improved. No further hematochezia today. Stopped Lexapro yesterday given known platelet interaction. She has a known ulcer at the ileoanal anastomosis site. Hgb stable today.   - Stop lexapro   - Maintain active type and screen and adequate IV access with at least 2 large bore IV  - Transfuse Hgb <7    Severe Malnutrition 2/2 [name] and abdominal desmoid tumors   She is currently receiving TPN 5/7 days of the week and is being followed by Dr. [name], outpatient GI. Taking small amounts of oral intake.  - TPN consult  - Low residue diet     Anxiety - Panic Attacks  Pt in talks with Adolescent Young Adult program at [facility]. Psychiatry following. Suspect episodes of pain and timing surrounding medications precipitate episodes of anxiety. Discharge Psych plan below:  - Continue Remeron 7.5 nightly  - HOLDING Lexapro 5 mg daily given recurrent BRBPR as above  - Continue gabapentin 200 mg nightly  - Psychiatry consult, appreciate recommendations  - Increased to 50 mg PO Atarax q6 PRN  - PRN Zyprexa     Psych DC recs:  -- START Atarax 50 mg q8H prn anxiety   -- CONTINUE Lexapro 5 mg daily (holding for now as above)   -- STOP Olanzapine 1.25 mg as needed daily for agitation   --  CONTINUE home Olanzapine 7.5 mg at bedtime  -- CONTINUE home Mirtazapine 7.5 mg at bedtime  -- CONTINUE home Gabapentin 200 mg nightly     Chronic Problems  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  Barrier to discharge remains use of IV opioids.    Team Contact Information:   Primary Team: Internal Medicine (MEDW)  Primary Resident: [name] Nipper, MD  Resident's Pager: [phone] [facility])    Interval History:     Doing okay this morning. Anxious about recurrent BRBPR and decreasing doses of IV dilaudid. Endorsing pain bilaterally surrounding her ribs. Worse with deep breaths.     Objective:   Temp:  [35.8 ??C (96.4 ??F)-37.1 ??C (98.8 ??F)] 35.8 ??C (96.4 ??F)  Pulse:  [81-104] 104  Resp:  [16-18] 18  BP: (106-123)/(58-72) 119/72  SpO2:  [98 %-100 %] 100 %    Gen: NAD, sitting upright in bed, appears tired   HENT: atraumatic, normocephalic  Heart: tachycardic, regular rhythm   Lungs: No increased WOB on RA   Abdomen: soft, mildly distended. TTP without guarding     Gus Lockie Pares, MD   [facility] Internal Medicine PGY-1

## 2022-03-24 NOTE — Unmapped (Signed)
Department of Anesthesiology  Pain Medicine Division    Chronic Pain Followup Inpatient Follow-Up Note    Requesting Attending Physician:  [name], MD  Service Requesting Consult:  Med General Welt (MDW)    Assessment/Recommendations:  22 y.o. female  with [name] (familial adenomatosis polyposis) s/p proctocolectomy with ileoanal anastomosis, desmoid tumors, anemia, severe protein calorie malnutrition that presented to [facility] with GI bleed and anemia. Follows with [name] at [facility].    Our service is re-engaging with [name]'s care as she has had an exacerbation in her abdominal pain after discontinuation of her hydromorphone IV on 1/5, the exacerbation of abdominal pain over the weekend was likely multifactorial in etiology - there were concerns of possible, transient SBO at a site of tethering in the small intestine on  CT and an ongoing, opioid-induced ileus. This exacerbation in pain necessitating reinitiation of IV hydromorphone.  She states that her pain is currently appropriately controlled at this time.  However, given concerns of her ongoing ileus and escalation of opioids; the primary team had wanted our team to reengage for assistance in reducing opioid requirements.    She did well with a lidocaine infusion at 1.5 mg/kg/hr over 72 hours.     Her primary pain generators include acute abdominal pain from possible transient small bowel obstruction, chronic pain in the setting of multiple abdominal surgeries and desmoid tumors.    Interval: Pain stable, tolerating medications without adverse effects. Episode of hematochezia overnight, mild increase in pain but controlled. We discussed decreasing IV hydromorphone requirements daily. Encouraged oral dilaudid use.     Recommendations:  -The chronic pain service is a consult service and does not place orders, just makes recommendations (except ketamine and lidocaine infusions)   -Please evaluate all patients on opioids for appropriateness of prescribing narcan at discharge.  The chronic pain service can assist with this.  Nasal narcan is covered by most insurances.  -Recommendations given apply to the current hospitalization and do not reflect long term recommendations.    Changes to current analgesic regimen:  -Adjust pregabalin: pregabalin 25 mg oral capsule three times daily scheduled   -Discontinue gabapentin 200 mg oral capsule nightly scheduled  -Adjust hydromorphone IV dosage to reflect: Hydromorphone 0.125 mg IV every 4 hours as needed for refractory, breakthrough pain (goal of weaning to off over the course of the next few days as abdominal pain improves)  -Adjust hydromorphone PO frequency to reflect: hydromorphone 2 mg oral tablet every 4 hours as needed for breakthrough pain (use oral hydromorphone before IV hydromorphone)     Continue without changes:  -Continue Butrans 10 mcg/hr patch every 7 days scheduled (please spray flonase onto site of patch application in order to prevent hypersensitivity that is adhesive-related; please ensure that patch alternates sites)     -Continue acetaminophen 1000 mg oral solution every 8 hours scheduled  -Continue diclofenac 1% gel topically 4 times a day scheduled  -Continue baclofen 5 mg oral tablet 3 times a day as needed for muscle spasms  -Continue oxycodone 10 mg oral tablet every 4 hours as needed for moderate to severe pain  -Continue naloxone intranasal as needed in the event of opioid induced respiratory depression or oversedation  -Continue engaging with pain psychology as needed (reinforced to patient that she will have to advocate for herself and let us know if she would like to set up an appointment)    Follow-up with [facility] on 04/01/2022 as an outpatient  if discharged from the hospital.     Home MME: 135  Current MME: 75 + Butrans 10 mcg/h patch     We will continue to follow.    Naloxone Rx at discharge?  Is patient on opioids? Yes.  1)Is dose >50MME? Yes.  2) Is patient prescribed a benzodiazepine (w opioids)? No.  3)Hx of overdose?  No.  4) Hx of substance use disorder? No.  5) Opioids likely to last greater than a week after discharge? Yes.    If yes to 2 or more, prescribe naloxone at discharge.  Nasal narcan for most insured (Nasal narcan 4mg /actuation, prescribe 1 kit, instructions at [email or web address]).  For uninsured, chronic pain can work to assist in finding an option.  OTC nasal narcan now available at most pharmacies for around $45.    Interim History  There were no Acute Events Overnight.  The patient is obtaining adequate pain relief on current medication regimen and feels that their pain is Somewhat controlled    Analgesia Evaluation:  Pain at minimum: 4/10  Pain at maximum: 8/10    Current pain medication regimen (including how frequent PRN's were used):  IV HM 0.5 x   IV HM 0.4 x 3  PO Oxy 10 x3      Inpatient Medications  Current Facility-Administered Medications   Medication Dose Route Frequency Provider Last Rate Last Admin    acetaminophen (TYLENOL) tablet 1,000 mg  1,000 mg Oral Q8H Cleotis Nipper, MD   1,000 mg at 03/24/22 1610    baclofen (LIORESAL) tablet 5 mg  5 mg Oral TID PRN Linna Hoff, MD   5 mg at 03/22/22 1639    buprenorphine 10 mcg/hour transdermal patch 1 patch  1 patch Transdermal Q7 Days Cleotis Nipper, MD   1 patch at 03/22/22 1053    cholecalciferol (vitamin D3 25 mcg (1,000 units)) tablet 25 mcg  25 mcg Oral Daily Cleotis Nipper, MD   25 mcg at 03/24/22 9604    ciprofloxacin HCl (CIPRO) tablet 500 mg  500 mg Oral Q12H SCH Durene Romans, MD   500 mg at 03/24/22 5409    clobetasol (TEMOVATE) 0.05 % ointment   Topical BID PRN Linna Hoff, MD        diclofenac sodium (VOLTAREN) 1 % gel 2 g  2 g Topical QID Linna Hoff, MD   2 g at 03/20/22 1109    famotidine (PEPCID) tablet 20 mg  20 mg Oral Nightly Achille Rich, MD   20 mg at 03/23/22 2111    Parenteral Nutrition (CENTRAL)   Intravenous Continuous Achille Rich, MD 100 mL/hr at 03/24/22 0030 New Bag at 03/24/22 0030    And    fat emulsion 20 % with fish oil (SMOFlipid) infusion 250 mL  250 mL Intravenous Continuous Achille Rich, MD 20.8 mL/hr at 03/24/22 0027 250 mL at 03/24/22 0027    [START ON 03/25/2022] Parenteral Nutrition (CENTRAL)   Intravenous Continuous Cleotis Nipper, MD        And    [START ON 03/25/2022] fat emulsion 20 % with fish oil (SMOFlipid) infusion 250 mL  250 mL Intravenous Continuous Hendrick, Rushie Chestnut, MD        fluticasone propionate (FLONASE) 50 mcg/actuation nasal spray 1 spray  1 spray Topical Daily PRN Muthukkumar, Rashmi, MD        gabapentin (NEURONTIN) capsule 200 mg  200 mg Oral Nightly Linna Hoff, MD  200 mg at 03/23/22 2111    HYDROmorphone (DILAUDID) tablet 2 mg  2 mg Oral Q6H PRN Achille Rich, MD   2 mg at 03/23/22 1229    HYDROmorphone (PF) (DILAUDID) injection 0.25 mg  0.25 mg Intravenous Q6H PRN Cleotis Nipper, MD   0.25 mg at 03/24/22 9811    hydrOXYzine (ATARAX) tablet 50 mg  50 mg Oral Q6H PRN Cleotis Nipper, MD   50 mg at 03/23/22 1604    hyoscyamine (LEVSIN) tablet 125 mcg  125 mcg Oral Q4H PRN Hollie Beach P, DO        lactated Ringers infusion  10 mL/hr Intravenous Continuous Bronda Alfred A, MD        melatonin tablet 3 mg  3 mg Oral Nightly PRN Linna Hoff, MD        mirtazapine (REMERON) tablet 7.5 mg  7.5 mg Oral Nightly Alan Ripper, DO   7.5 mg at 03/23/22 2112    naloxone [facility]) injection 0.4 mg  0.4 mg Intravenous Once PRN Linna Hoff, MD        OLANZapine (ZyPREXA) tablet 1.25 mg  1.25 mg Oral Daily PRN Muthukkumar, Rashmi, MD        OLANZapine zydis (ZyPREXA) disintegrating tablet 7.5 mg  7.5 mg Oral Nightly Goldbeck, Lauren D, MD   7.5 mg at 03/23/22 2110    ondansetron (ZOFRAN-ODT) disintegrating tablet 8 mg  8 mg Oral Q12H PRN Hollie Beach P, DO   8 mg at 03/21/22 0913    oxyCODONE (ROXICODONE) immediate release tablet 10 mg  10 mg Oral Q4H PRN Achille Rich, MD   10 mg at 03/24/22 0843    pantoprazole (Protonix) injection 40 mg  40 mg Intravenous BID Achille Rich, MD   40 mg at 03/24/22 0828    phenol (CHLORASEPTIC) 1.4 % spray 2 spray  2 spray Mucous Membrane Q2H PRN Goldbeck, Eliott Nine, MD        polyethylene glycol (MIRALAX) packet 17 g  17 g Oral Daily Cleotis Nipper, MD   17 g at 03/24/22 0828    pregabalin (LYRICA) capsule 25 mg  25 mg Oral BID Achille Rich, MD   25 mg at 03/24/22 0828    simethicone (MYLICON) chewable tablet 80 mg  80 mg Oral Q6H PRN Achille Rich, MD   80 mg at 03/14/22 1810         Objective:     Vital Signs    Temp:  [35.8 ??C (96.4 ??F)-37.1 ??C (98.8 ??F)] 35.8 ??C (96.4 ??F)  Pulse:  [81-104] 104  Resp:  [16-18] 18  BP: (106-123)/(51-72) 119/72  MAP (mmHg):  [69-86] 85  SpO2:  [98 %-100 %] 100 %      Physical Exam    GENERAL:  Well developed, well-nourished female and is in mild distress.   HEAD/NECK:    Reveals normocephalic/atraumatic.   CARDIOVASCULAR:   RRR, no murmur  LUNGS:   Normal work of breathing, no supplemental 02  EXTREMITIES:  Warm, no clubbing, cyanosis, or edema was noted.  NEUROLOGIC:    The patient was alert and oriented times four with normal language, attention, cognition and memory. Cranial nerve exam was grossly normal.    MUSCULOSKELETAL:    Moving all extremities  SKIN:  No obvious rashes lesions or erythema  PSY:  Appropriate affect and mood.    Test Results    Lab Results   Component Value Date  CREATININE 0.50 (L) 03/23/2022     Lab Results   Component Value Date    ALKPHOS 43 (L) 03/23/2022    BILITOT <0.2 (L) 03/23/2022    BILIDIR <0.10 03/17/2022    PROT 5.6 (L) 03/23/2022    ALBUMIN 3.1 (L) 03/23/2022    ALT 21 03/23/2022    AST 21 03/23/2022           Problem List    Principal Problem:    GI bleeding  Active Problems:    [name]    [name]    Pain    Iron deficiency anemia due to chronic blood loss    Severe protein-calorie malnutrition (CMS-HCC)    Lower abdominal pain    Generalized anxiety disorder with panic attacks    MDD (major depressive disorder)    Small intestinal bacterial overgrowth (SIBO)    Ileus (CMS-HCC)

## 2022-03-24 NOTE — Unmapped (Addendum)
A/O x 4, afebrile, VSS on RA, denies pain at this time or PRN meds given for pain (see MAR). IV clean, dry, intact, and flushed per protocol. Falls precautions maintained. Call bell and bedside table within reach, bed in lowest position and locked. No falls or injuries noted this shift. No bloody BM during the shift. Patient had medium bloody BM and messaged Dr. Lockie Pares about BM. MD placed order hgb. VS stable.   Vitals:    03/24/22 1700   BP: 125/65   Pulse: 101   Resp: 17   Temp: 36.8 ??C (98.2 ??F)   SpO2: 99%      Problem: Adult Inpatient Plan of Care  Goal: Plan of Care Review  Outcome: Ongoing - Unchanged  Goal: Patient-Specific Goal (Individualized)  Outcome: Ongoing - Unchanged  Goal: Absence of Hospital-Acquired Illness or Injury  Outcome: Ongoing - Unchanged  Goal: Optimal Comfort and Wellbeing  Outcome: Ongoing - Unchanged  Goal: Readiness for Transition of Care  Outcome: Ongoing - Unchanged  Goal: Rounds/Family Conference  Outcome: Ongoing - Unchanged

## 2022-03-24 NOTE — Unmapped (Addendum)
Alert, oriented x 4. VSS. No further hematochezia since stool at 1900. Q 4 hr H&H drawn- 8.9-> 7.9. Right and left abd pain radiating under ribcage responsive to prn oxycodone and Dilaudid. TPN/Lipids hung at 2400. Family at bedside.    Problem: Adult Inpatient Plan of Care  Goal: Plan of Care Review  03/24/2022 0437 by Alfredo Bach, RN  Outcome: Progressing  Goal: Patient-Specific Goal (Individualized)  03/24/2022 0437 by Alfredo Bach, RN  Outcome: Progressing  Goal: Absence of Hospital-Acquired Illness or Injury  03/24/2022 0437 by Alfredo Bach, RN  Outcome: Progressing  Intervention: Identify and Manage Fall Risk  Recent Flowsheet Documentation  Taken 03/23/2022 2000 by Alfredo Bach, RN  Safety Interventions: family at bedside  Goal: Optimal Comfort and Wellbeing  03/24/2022 0437 by Alfredo Bach, RN  Outcome: Progressing  Goal: Readiness for Transition of Care  03/24/2022 0437 by Alfredo Bach, RN  Outcome: Progressing  Goal: Rounds/Family Conference  03/24/2022 0437 by Alfredo Bach, RN  Outcome: Progressing  Problem: Adult Inpatient Plan of Care  Goal: Optimal Comfort and Wellbeing  03/24/2022 0437 by Alfredo Bach, RN  Outcome: Progressing  Problem: Pain Acute  Goal: Optimal Pain Control and Function  03/24/2022 0437 by Alfredo Bach, RN  Outcome: Progressing

## 2022-03-25 LAB — CBC
HEMATOCRIT: 23.7 % — ABNORMAL LOW (ref 34.0–44.0)
HEMOGLOBIN: 7.7 g/dL — ABNORMAL LOW (ref 11.3–14.9)
MEAN CORPUSCULAR HEMOGLOBIN CONC: 32.6 g/dL (ref 32.0–36.0)
MEAN CORPUSCULAR HEMOGLOBIN: 26.4 pg (ref 25.9–32.4)
MEAN CORPUSCULAR VOLUME: 81 fL (ref 77.6–95.7)
MEAN PLATELET VOLUME: 9.3 fL (ref 6.8–10.7)
PLATELET COUNT: 189 10*9/L (ref 150–450)
RED BLOOD CELL COUNT: 2.93 10*12/L — ABNORMAL LOW (ref 3.95–5.13)
RED CELL DISTRIBUTION WIDTH: 15.5 % — ABNORMAL HIGH (ref 12.2–15.2)
WBC ADJUSTED: 7.2 10*9/L (ref 3.6–11.2)

## 2022-03-25 LAB — COMPREHENSIVE METABOLIC PANEL
ALBUMIN: 2.9 g/dL — ABNORMAL LOW (ref 3.4–5.0)
ALKALINE PHOSPHATASE: 57 U/L (ref 46–116)
ALT (SGPT): 130 U/L — ABNORMAL HIGH (ref 10–49)
ANION GAP: 4 mmol/L — ABNORMAL LOW (ref 5–14)
AST (SGOT): 143 U/L — ABNORMAL HIGH (ref <=34)
BILIRUBIN TOTAL: <0.2 mg/dL — ABNORMAL LOW (ref 0.3–1.2)
BLOOD UREA NITROGEN: 15 mg/dL (ref 9–23)
BUN / CREAT RATIO: 33
CALCIUM: 8.8 mg/dL (ref 8.7–10.4)
CHLORIDE: 109 mmol/L — ABNORMAL HIGH (ref 98–107)
CO2: 28 mmol/L (ref 20.0–31.0)
CREATININE: 0.45 mg/dL — ABNORMAL LOW
EGFR CKD-EPI (2021) FEMALE: >90 mL/min/{1.73_m2} (ref >=60)
GLUCOSE RANDOM: 108 mg/dL (ref 70–179)
POTASSIUM: 3.9 mmol/L (ref 3.4–4.8)
PROTEIN TOTAL: 5.3 g/dL — ABNORMAL LOW (ref 5.7–8.2)
SODIUM: 141 mmol/L (ref 135–145)

## 2022-03-25 LAB — TRIGLYCERIDES: TRIGLYCERIDES: 110 mg/dL (ref 0–150)

## 2022-03-25 LAB — PHOSPHORUS: PHOSPHORUS: 4.3 mg/dL (ref 2.4–5.1)

## 2022-03-25 LAB — MAGNESIUM: MAGNESIUM: 2 mg/dL (ref 1.6–2.6)

## 2022-03-25 MED ADMIN — pregabalin (LYRICA) capsule 25 mg: 25 mg | ORAL | @ 02:00:00

## 2022-03-25 MED ADMIN — polyethylene glycol (MIRALAX) packet 17 g: 17 g | ORAL | @ 14:00:00

## 2022-03-25 MED ADMIN — acetaminophen (TYLENOL) tablet 1,000 mg: 1000 mg | ORAL | @ 11:00:00

## 2022-03-25 MED ADMIN — pregabalin (LYRICA) capsule 25 mg: 25 mg | ORAL | @ 14:00:00

## 2022-03-25 MED ADMIN — HYDROmorphone (PF) (DILAUDID) injection 0.15 mg: .15 mg | INTRAVENOUS | @ 04:00:00 | Stop: 2022-03-26

## 2022-03-25 MED ADMIN — ciprofloxacin HCl (CIPRO) tablet 500 mg: 500 mg | ORAL | @ 02:00:00 | Stop: 2022-03-26

## 2022-03-25 MED ADMIN — hydrOXYzine (ATARAX) tablet 50 mg: 50 mg | ORAL | @ 17:00:00

## 2022-03-25 MED ADMIN — OLANZapine zydis (ZyPREXA) disintegrating tablet 7.5 mg: 7.5 mg | ORAL | @ 02:00:00

## 2022-03-25 MED ADMIN — pregabalin (LYRICA) capsule 25 mg: 25 mg | ORAL | @ 19:00:00

## 2022-03-25 MED ADMIN — acetaminophen (TYLENOL) tablet 1,000 mg: 1000 mg | ORAL | @ 02:00:00

## 2022-03-25 MED ADMIN — HYDROmorphone (PF) (DILAUDID) injection 0.15 mg: .15 mg | INTRAVENOUS | @ 17:00:00 | Stop: 2022-03-25

## 2022-03-25 MED ADMIN — pantoprazole (Protonix) injection 40 mg: 40 mg | INTRAVENOUS | @ 14:00:00

## 2022-03-25 MED ADMIN — pantoprazole (Protonix) injection 40 mg: 40 mg | INTRAVENOUS | @ 02:00:00

## 2022-03-25 MED ADMIN — fat emulsion 20 % with fish oil (SMOFlipid) infusion 250 mL: 250 mL | INTRAVENOUS | @ 05:00:00 | Stop: 2022-03-25

## 2022-03-25 MED ADMIN — HYDROmorphone (DILAUDID) tablet 2 mg: 2 mg | ORAL | @ 03:00:00 | Stop: 2022-03-29

## 2022-03-25 MED ADMIN — oxyCODONE (ROXICODONE) immediate release tablet 10 mg: 10 mg | ORAL | @ 02:00:00 | Stop: 2022-03-25

## 2022-03-25 MED ADMIN — cholecalciferol (vitamin D3 25 mcg (1,000 units)) tablet 25 mcg: 25 ug | ORAL | @ 14:00:00

## 2022-03-25 MED ADMIN — ciprofloxacin HCl (CIPRO) tablet 500 mg: 500 mg | ORAL | @ 14:00:00 | Stop: 2022-03-25

## 2022-03-25 MED ADMIN — famotidine (PEPCID) tablet 20 mg: 20 mg | ORAL | @ 02:00:00

## 2022-03-25 MED ADMIN — mirtazapine (REMERON) tablet 7.5 mg: 7.5 mg | ORAL | @ 02:00:00

## 2022-03-25 MED ADMIN — Parenteral Nutrition (CENTRAL): INTRAVENOUS | @ 05:00:00 | Stop: 2022-03-26

## 2022-03-25 MED ADMIN — oxyCODONE (ROXICODONE) immediate release tablet 10 mg: 10 mg | ORAL | @ 17:00:00 | Stop: 2022-04-06

## 2022-03-25 MED ADMIN — acetaminophen (TYLENOL) tablet 1,000 mg: 1000 mg | ORAL | @ 19:00:00

## 2022-03-25 MED ADMIN — oxyCODONE (ROXICODONE) immediate release tablet 10 mg: 10 mg | ORAL | @ 22:00:00 | Stop: 2022-04-06

## 2022-03-25 MED ADMIN — HYDROmorphone (PF) (DILAUDID) injection 0.15 mg: .15 mg | INTRAVENOUS | @ 11:00:00 | Stop: 2022-03-25

## 2022-03-25 NOTE — Unmapped (Signed)
A/O x 4, afebrile, VSS on RA, PRN meds given for pain (see MAR). Double lumen power port, accessed, clean, dry, intact, and flushed per protocol. VTE: Active bleed. Mom at bedside. Falls precautions maintained. Call bell and bedside table within reach, bed in lowest position and locked. No falls or injuries noted this shift. Will continue with plan of care.    Problem: Adult Inpatient Plan of Care  Goal: Plan of Care Review  Outcome: Progressing  Goal: Patient-Specific Goal (Individualized)  Outcome: Progressing  Flowsheets (Taken 03/25/2022 1353)  Patient/Family-Specific Goals (Include Timeframe): Free from falls or injuries through discharge  Individualized Care Needs: Falls, labs, VS, pain  Anxieties, Fears or Concerns: Pain management  Goal: Absence of Hospital-Acquired Illness or Injury  Outcome: Progressing  Intervention: Prevent and Manage VTE (Venous Thromboembolism) Risk  Recent Flowsheet Documentation  Taken 03/25/2022 1200 by Doree Barthel, RN  Anti-Embolism Intervention: (Active bleed) Other (Comment)  Taken 03/25/2022 1000 by Doree Barthel, RN  Anti-Embolism Intervention: (Active bleed) Other (Comment)  Taken 03/25/2022 0800 by Doree Barthel, RN  Anti-Embolism Intervention: (Active bleed) Other (Comment)  Taken 03/25/2022 0708 by Doree Barthel, RN  Anti-Embolism Intervention: (Active bleed) Other (Comment)  Goal: Optimal Comfort and Wellbeing  Outcome: Progressing  Goal: Readiness for Transition of Care  Outcome: Progressing  Goal: Rounds/Family Conference  Outcome: Progressing     Problem: Latex Allergy  Goal: Absence of Allergy Symptoms  Outcome: Progressing     Problem: Gastrointestinal Bleeding  Goal: Optimal Coping with Acute Illness  Outcome: Progressing  Goal: Hemostasis  Outcome: Progressing     Problem: Pain Acute  Goal: Optimal Pain Control and Function  Outcome: Progressing     Problem: Fall Injury Risk  Goal: Absence of Fall and Fall-Related Injury  Outcome: Progressing

## 2022-03-25 NOTE — Unmapped (Signed)
Department of Anesthesiology  Pain Medicine Division    Chronic Pain Followup Inpatient Follow-Up Note    Requesting Attending Physician:  [name], MD  Service Requesting Consult:  Med General Welt (MDW)    Assessment/Recommendations:  22 y.o. 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 Follows with [name] at [facility].    Our service is re-engaging with [name] [name]'s care as she has had an exacerbation in her abdominal pain after discontinuation of her hydromorphone IV on 1/5, the exacerbation of abdominal pain over the weekend was likely multifactorial in etiology - there were concerns of possible, transient SBO at a site of tethering in the small intestine on  CT and an ongoing, opioid-induced ileus. This exacerbation in pain necessitating reinitiation of IV hydromorphone.  She states that her pain is currently appropriately controlled at this time.  However, given concerns of her ongoing ileus and escalation of opioids; the primary team had wanted our team to reengage for assistance in reducing opioid requirements.    She did well with a lidocaine infusion at 1.5 mg/kg/hr over 72 hours.     Her primary pain generators include acute abdominal pain from possible transient small bowel obstruction, chronic pain in the setting of multiple abdominal surgeries and desmoid tumors.    Recommendations:  -The chronic pain service is a consult service and does not place orders, just makes recommendations (except ketamine and lidocaine infusions)   -Please evaluate all patients on opioids for appropriateness of prescribing narcan at discharge.  The chronic pain service can assist with this.  Nasal narcan is covered by most insurances.  -Recommendations given apply to the current hospitalization and do not reflect long term recommendations.    Changes to current analgesic regimen:  - Discontinue IV Hydromorphone order    Continue without changes:  -Continue Butrans 10 mcg/hr patch every 7 days scheduled (please spray flonase onto site of patch application in order to prevent hypersensitivity that is adhesive-related; please ensure that patch alternates sites)  -Continue PO HM 2mg  q4h prn for breakthrough pain  -Continue Lyrica 25mg  tid  -Continue acetaminophen 1000 mg oral solution every 8 hours scheduled  -Continue diclofenac 1% gel topically 4 times a day scheduled  -Continue baclofen 5 mg oral tablet 3 times a day as needed for muscle spasms  -Continue oxycodone 10 mg oral tablet every 4 hours as needed for moderate to severe pain  -Continue naloxone intranasal as needed in the event of opioid induced respiratory depression or oversedation  -Continue engaging with pain psychology as needed (reinforced to patient that she will have to advocate for herself and let us know if she would like to set up an appointment)    Follow-up with [facility] on 04/01/2022 as an outpatient if discharged from the hospital.     Home MME: 135  Current MME: 95 + Butrans 10 mcg/h patch     We will continue to follow.    Naloxone Rx at discharge?  Is patient on opioids? Yes.  1)Is dose >50MME?  Yes.  2) Is patient prescribed a benzodiazepine (w opioids)? No.  3)Hx of overdose?  No.  4) Hx of substance use disorder? No.  5) Opioids likely to last greater than a week after discharge? Yes.    If yes to 2 or more, prescribe naloxone at discharge.  Nasal  narcan for most insured (Nasal narcan 4mg /actuation, prescribe 1 kit, instructions at [email or web address]).  For uninsured, chronic pain can work to assist in finding an option.  OTC nasal narcan now available at most pharmacies for around $45.    Interim History  There were no Acute Events Overnight.  The patient is obtaining adequate pain relief on current medication regimen and feels that their pain is Somewhat controlled    Ongoing episodes of hematochezia in past 24 hours. H/H this AM 7.7/23.7. Endorses modest benefit from the analgesic regimen. Agreeable to plan to dc IV dilaudid but keeping PO on for breakthrough pain.    Analgesia Evaluation:  Pain at minimum: 5/10  Pain at maximum: 8/10    Inpatient Medications  Current Facility-Administered Medications   Medication Dose Route Frequency Provider Last Rate Last Admin    acetaminophen (TYLENOL) tablet 1,000 mg  1,000 mg Oral Q8H Cleotis Nipper, MD   1,000 mg at 03/25/22 0538    baclofen (LIORESAL) tablet 5 mg  5 mg Oral TID PRN Linna Hoff, MD   5 mg at 03/22/22 1639    buprenorphine 10 mcg/hour transdermal patch 1 patch  1 patch Transdermal Q7 Days Cleotis Nipper, MD   1 patch at 03/22/22 1053    cholecalciferol (vitamin D3 25 mcg (1,000 units)) tablet 25 mcg  25 mcg Oral Daily Cleotis Nipper, MD   25 mcg at 03/25/22 0981    ciprofloxacin HCl (CIPRO) tablet 500 mg  500 mg Oral Q12H SCH Cleotis Nipper, MD   500 mg at 03/25/22 1914    clobetasol (TEMOVATE) 0.05 % ointment   Topical BID PRN Linna Hoff, MD        diclofenac sodium (VOLTAREN) 1 % gel 2 g  2 g Topical QID Linna Hoff, MD   2 g at 03/20/22 1109    famotidine (PEPCID) tablet 20 mg  20 mg Oral Nightly Achille Rich, MD   20 mg at 03/24/22 2057    [START ON 03/26/2022] Parenteral Nutrition (CENTRAL)   Intravenous Continuous [name], MD        And    [START ON 03/26/2022] fat emulsion 20 % with fish oil (SMOFlipid) infusion 250 mL  250 mL Intravenous Continuous [name], MD        fluticasone propionate (FLONASE) 50 mcg/actuation nasal spray 1 spray  1 spray Topical Daily PRN Muthukkumar, Rashmi, MD        HYDROmorphone (DILAUDID) tablet 2 mg  2 mg Oral Q4H PRN Cleotis Nipper, MD   2 mg at 03/24/22 2136    HYDROmorphone (PF) (DILAUDID) injection 0.15 mg  0.15 mg Intravenous Q4H PRN Cleotis Nipper, MD   0.15 mg at 03/25/22 1205 hydrOXYzine (ATARAX) tablet 50 mg  50 mg Oral Q6H PRN Cleotis Nipper, MD   50 mg at 03/25/22 1215    hyoscyamine (LEVSIN) tablet 125 mcg  125 mcg Oral Q4H PRN Hollie Beach P, DO        lactated Ringers infusion  10 mL/hr Intravenous Continuous Siddiqui, Zia A, MD        melatonin tablet 3 mg  3 mg Oral Nightly PRN Linna Hoff, MD        mirtazapine (REMERON) tablet 7.5 mg  7.5 mg Oral Nightly Alan Ripper, DO   7.5 mg at 03/24/22 2107    naloxone [facility]) injection 0.4 mg  0.4 mg Intravenous Once PRN Linna Hoff,  MD        OLANZapine (ZyPREXA) tablet 1.25 mg  1.25 mg Oral Daily PRN Muthukkumar, Rashmi, MD        OLANZapine zydis (ZyPREXA) disintegrating tablet 7.5 mg  7.5 mg Oral Nightly Goldbeck, Lauren D, MD   7.5 mg at 03/24/22 2057    ondansetron (ZOFRAN-ODT) disintegrating tablet 8 mg  8 mg Oral Q12H PRN Hollie Beach P, DO   8 mg at 03/21/22 0913    oxyCODONE (ROXICODONE) immediate release tablet 10 mg  10 mg Oral Q4H PRN Boccaccio, Dominic E, MD   10 mg at 03/25/22 1215    pantoprazole (Protonix) injection 40 mg  40 mg Intravenous BID Achille Rich, MD   40 mg at 03/25/22 1610    Parenteral Nutrition (CENTRAL)   Intravenous Continuous Cleotis Nipper, MD   Stopped at 03/25/22 1207    phenol (CHLORASEPTIC) 1.4 % spray 2 spray  2 spray Mucous Membrane Q2H PRN Goldbeck, Eliott Nine, MD        polyethylene glycol (MIRALAX) packet 17 g  17 g Oral Daily Cleotis Nipper, MD   17 g at 03/25/22 0907    pregabalin (LYRICA) capsule 25 mg  25 mg Oral TID Cleotis Nipper, MD   25 mg at 03/25/22 0907    simethicone (MYLICON) chewable tablet 80 mg  80 mg Oral Q6H PRN Achille Rich, MD   80 mg at 03/14/22 1810         Objective:     Vital Signs    Temp:  [36.5 ??C (97.7 ??F)-37.6 ??C (99.7 ??F)] 37 ??C (98.6 ??F)  Pulse:  [85-101] 100  Resp:  [17-18] 18  BP: (102-125)/(49-65) 109/52  MAP (mmHg):  [64-83] 67  SpO2:  [98 %-99 %] 98 %      Physical Exam    GENERAL:  Well developed, well-nourished female and is in mild distress.   HEAD/NECK:    Reveals normocephalic/atraumatic.   CARDIOVASCULAR:   RRR, no murmur  LUNGS:   Normal work of breathing, no supplemental 02  EXTREMITIES:  Warm, no clubbing, cyanosis, or edema was noted.  NEUROLOGIC:    The patient was alert and oriented times four with normal language, attention, cognition and memory. Cranial nerve exam was grossly normal.    MUSCULOSKELETAL:    Moving all extremities  SKIN:  No obvious rashes lesions or erythema  PSY:  Appropriate affect and mood.    Test Results    Lab Results   Component Value Date    CREATININE 0.45 (L) 03/25/2022     Lab Results   Component Value Date    ALKPHOS 57 03/25/2022    BILITOT <0.2 (L) 03/25/2022    BILIDIR <0.10 03/17/2022    PROT 5.3 (L) 03/25/2022    ALBUMIN 2.9 (L) 03/25/2022    ALT 130 (H) 03/25/2022    AST 143 (H) 03/25/2022           Problem List    Principal Problem:    GI bleeding  Active Problems:    [name]    [name]    Pain    Iron deficiency anemia due to chronic blood loss    Severe protein-calorie malnutrition (CMS-HCC)    Lower abdominal pain    Generalized anxiety disorder with panic attacks    MDD (major depressive disorder)    Small intestinal bacterial overgrowth (SIBO)    Ileus (CMS-HCC)

## 2022-03-25 NOTE — Unmapped (Signed)
Internal Medicine (MEDW) Progress Note    Assessment & Plan:   [name] [name] is a 22 y.o. 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      Principal Problem:    GI bleeding  Active Problems:    [name]    [name]    Pain    Iron deficiency anemia due to chronic blood loss    Severe protein-calorie malnutrition (CMS-HCC)    Lower abdominal pain    Generalized anxiety disorder with panic attacks    MDD (major depressive disorder)    Small intestinal bacterial overgrowth (SIBO)    Ileus (CMS-HCC)  Resolved Problems:    * No resolved hospital problems. *    Active Problems    Acute on Chronic Abdominal Pain - Desmoid Tumors - Ileus (resolved) - Narcotic Bowel  Pain under decent control this AM compared to prior despite episode of hematochezia yesterday evening. Will discontinue IV dilaudid today and continue with PO dilaudid PRN.   - Low residue diet  - Continue daily MiraLax  - Complete Cipro 500mg  BID x14 days (EOT 1/16) [needs to be oral not IV]  - Pain Regimen:   - Continue Lyrica 25 mg TID    - s/p Lidocaine infusion x 3 days (EOT 1/11)   - Continue Butrans patch 10 mcg (Flonase admin to skin prior) q7 days   - Discontinue IV Dilaudid     - Dilaudid 2 mg oral q4 hours PRN  - Liquid Tylenol 1,000 mg q8hr  - Oxy 10mg  q6h PRN  - Discontinue Gabapentin 200mg  at bedtime  - Baclofen 5mg  TID PRN     BRBPR I Acute on Chronic Anemia  Hematochezia noted yesterday in the setting of known ileoanal anastomosis site ulcer. Hgb remains stable. Lexapro stopped 1/14 given known platelet interaction.  - Stop lexapro   - Maintain active type and screen and adequate IV access with at least 2 large bore IV  - Transfuse Hgb <7    Transaminitis  AST/ALT acutely elevated (143, 130, respectively) on 1/16. Can consider possible DILI given patient's extensive medication regimen. Possible contributors includes PPI and Tylenol.   - Daily HFP    Severe Malnutrition 2/2 [name] and abdominal desmoid tumors   She is currently receiving TPN 5/7 days of the week and is being followed by Dr. [name], outpatient GI. Taking small amounts of oral intake.  - TPN consult  - Low residue diet     Anxiety - Panic Attacks  Pt in talks with Adolescent Young Adult program at [facility]. Psychiatry following. Suspect episodes of pain and timing surrounding medications precipitate episodes of anxiety. 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  Barrier to discharge remains use of IV opioids.    Team Contact Information:   Primary Team: Internal Medicine (MEDW)  Primary Resident: Marda Stalker, MD  Resident's Pager: [phone] [facility] Senior Resident)    Interval History:     Doing okay this morning. Pain is not worse or better compared to yesterday. No further hematochezia since last night.     Objective:   Temp:  [36.5 ??C (97.7 ??F)-37.6 ??C (99.7 ??F)] 37 ??C (98.6 ??F)  Pulse:  [85-101] 100  Resp:  [17-18] 18  BP: (102-125)/(49-65) 109/52  SpO2:  [98 %-99 %] 98 %    Gen: NAD, sitting upright in bed, appears tired   HENT: atraumatic, normocephalic  Heart: tachycardic, regular rhythm   Lungs: No increased WOB on RA   Abdomen: soft, mildly distended. TTP without guarding     Theda Sers, MD  PGY-1, [facility] Internal Medicine

## 2022-03-25 NOTE — Unmapped (Cosign Needed)
[facility]  Follow-Up Psychiatry Consult Note     Service Date: March 25, 2022  LOS:  LOS: 13 days      Assessment:   [name] [name] is a 22 y.o. 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    Patient was seen in consultation by Psychiatry at the request of [name] Cree, MD with [facility] (MDW) for evaluation of  medication recommendations/starting an SSRI .     The patient's presentation, including presence of multiple worries that are difficult to control, sleep disturbance, fatigue/low energy, and impaired concentration appears to be most consistent with well-documented diagnosis of generalized anxiety disorder. Patient also has known Major Depressive Disorder characterized by reports of depressed mood, sleep disturbance, low energy, impaired concentration. Also consider adjustment disorder with depression due to acute severe medical conditions. There's also a concern that patient's poor nutrition status is contributing to her withdrawn state, low energy, and some other somatic concerns. Per Chart Review, patient has been established with [facility] psychiatry since 11/07/2021 and it has been documented that patient has not tolerated higher dose of Remeron beyond 7.5mg . [facility] psychiatry also started and titrated Olanzapine for anxiety, sleep, and appetite.     As of 1/16,  patient has experienced two episodes of hematochezia over the over the past 2 days, most concerning for recurrent gi bleed; primary care team discontinued Lexapro due to increased risk of bleeding on medication. In an abundance of caution, agree with plan to discontinue Lexapro, with recommendation to follow up with outpatient psychiatrist. Patient otherwise notes significant improvement in anxiety on hydroxyzine dose, improved ambulation and was able to eat some solid food over the weekend, therefore plan to continue patient on PRN Hydroxyzine as scheduled below, with recommendation that patient be discharged with additional prn doses. She desires to be at home. Due to stabilization of patient's mood symptoms, we will be signing off at time time.  Outpatient appointment has been scheduled with Dr. Malcolm Metro in February.       Please see below for detailed recommendations.    Diagnoses:   Active Hospital problems:  Principal Problem:    GI bleeding  Active Problems:    [name]    [name]    Pain    Iron deficiency anemia due to chronic blood loss    Severe protein-calorie malnutrition (CMS-HCC)    Lower abdominal pain    Generalized anxiety disorder with panic attacks    MDD (major depressive disorder)    Small intestinal bacterial overgrowth (SIBO)    Ileus (CMS-HCC)     Problems edited/added by me:  No problems updated.    Safety Risk Assessment:  There was a psychiatric evaluation with full risk assessment performed on 03/13/2022. At this time there is no change in risk, patient remains at low risk of suicide/ harm to self and at low risk of harm to others.     Recommendations:   ## Safety and Observation Level:   -- Based on the BSA or psychiatric evaluation, we estimate the patient to be at low risk for suicide in the current setting. We recommend routine level of observation on medical unit. This decision is based on my review  of the chart including patient's history and current presentation, interview of the patient, mental status examination, and consideration of suicide risk including evaluating suicidal ideation, plan, intent, suicidal or self-harm behaviors, risk factors, and protective factors. This judgment is based on our ability to directly address suicide risk, implement suicide prevention strategies and develop a safety plan while the patient is in the clinical setting.   -- If the patient attempts to leave against medical advice and it is felt to be unsafe for them to leave, please call a Behavioral Response and page Psychiatry at [phone].  -- Please contact our team if there is a concern that risk level has changed.    ## Discharge Medications:   -- CONTINUE Atarax 50 mg q8H prn anxiety while inpatient, recommend discharge with Atarax 50 mg q8H prn anxiety   -- Agree with plan to DISCONTINUE Lexapro 5 mg daily    -- CONTINUE Olanzapine 1.25 mg as needed daily for agitation while inpatient, can DISCONTINUE on discharge  -- CONTINUE home Olanzapine 7.5 mg at bedtime  -- CONTINUE home Mirtazapine 7.5 mg at bedtime    ## Medical Decision Making Capacity:   -- A formal capacity assessement was not performed as a part of this evaluation.  If specific capacity questions arise, please contact our team as below.     ## Further Work-up:   -- Continue to work-up and treat possible medical conditions that may be contributing to current presentation.   -- While the patient is receiving medications (such as Olanzapine) that may prolong QTc and increase risk for torsades:     - MONITOR and KEEP Mg>2 and K>4      - MONITOR QTc regularly.  If QTc on tele strip >470ms, obtain 12-lead EKG.    ## Disposition:   -- When patient is discharged, please ensure that their AVS includes information about the 66 Suicide & Crisis Lifeline.  -- There are no psychiatric contraindications to discharging this patient when medically appropriate.  -- The patient will follow-up with [facility] psychiatry with Dr. Kathrine [facility]addock [facility] Psychiatry Fellow) for mental health care at the time of discharge.    ## Behavioral / Environmental:   -- Although not currently delirious, the patient is at an elevated risk for developing delirium. Please utilize delirium prevention protocol.    Thank you for this consult request. Recommendations have been communicated to the primary team.  We will sign off at this time. Please page [phone] for any questions or concerns.       Discussed with  Fellow, Kathrine Haddock, MD.  Discussed with  Attending, Laurine Blazer, MD, who agrees with the assessment and plan.     [facility] MS4     I attest that I have reviewed the student note and that the components of the history of the present illness, the physical exam, and the assessment and plan documented were performed by me or were performed in my presence by the student where I verified the documentation and performed (or re-performed) the exam and medical decision making.    Lovett Calender, MD   PGY-3 Psychiatry     Interval History:   Relevant events since last seen by psychiatry  Alert, oriented x 4. VSS. Reports one episode of  hematochezia on 1/16 with considerable blood in toilet water, and one 1/15.  Lexapro was discontinued. 11/16 Hg  7.7, trending down. Responding well to present analgesic and anxiety regimen.       PRN  Usage:   Hydroxyzine tablet 50 mg    -1/14 x1   -1/15 x2         Pain Management:     -  hydromorphone 2 mg oral tablet every 4 hours as needed for breakthrough pain (use oral hydromorphone before IV hydromorphone)   - Tylenol 1000 mg oral solution every 8 hours scheduled  - diclofenac 1% gel topically 4x/day scheduled  - pregabalin 25 mg oral capsule three times daily scheduled   - Baclofen 5 mg oral tablet 3 times a day PRN, for muscle spasms  - Oxycodone 10 mg oral tablet q4h PRN, for moderate to severe pain  -  Hydromorphone 0.125 mg IV every 4 hours as needed for refractory, (goal of weaning to off over the course of the next few days as abdominal pain improves)  -Continue naloxone intranasal as needed in the event of opioid induced respiratory depression or oversedation  -Continue engaging with pain psychology as needed   - stopped gabapentin     Patient Interview:   Patient notes that she is feeling 'OK. endorses that hydroxyzine continues to be helpful. No thoughts of depression or thoughts of harm to self or others. She has been able to tolerate PO and ambulation. She has been doing well with the Butrans patch while the dilaudid. Lexapro has been stopped. Denies thoughts of harm to self or others. She is looking forward to discharge and following up with Dr. Malcolm Metro outpatient. Mom has applied for patient to have a service dog for anxiety.       Collateral information:   - Reviewed medical records in Epic  - Spoke to patient's mother see above interview where mom was present and actively participated during the interview      ROS:   All systems reviewed as negative/unremarkable aside from the following pertinent positives and negatives: abdominal pain    Collateral:   - Reviewed medical records in Epic    Relevant Updates to past psychiatric, medical/surgical, family, or social history: none    Current Medications:  Scheduled Meds:  ??? acetaminophen  1,000 mg Oral Q8H   ??? buprenorphine  1 patch Transdermal Q7 Days   ??? cholecalciferol (vitamin D3-10 mcg (400 unit))  25 mcg Oral Daily   ??? ciprofloxacin HCl  500 mg Oral Q12H SCH   ??? diclofenac sodium  2 g Topical QID   ??? famotidine  20 mg Oral Nightly   ??? mirtazapine  7.5 mg Oral Nightly   ??? OLANZapine zydis  7.5 mg Oral Nightly   ??? pantoprazole (Protonix) intravenous solution  40 mg Intravenous BID   ??? polyethylene glycol  17 g Oral Daily   ??? pregabalin  25 mg Oral TID     Continuous Infusions:  ??? [START ON 03/26/2022] Parenteral Nutrition (CENTRAL)      And   ??? [START ON 03/26/2022] fat emulsion 20 % with fish oil     ??? lactated Ringers     ??? Parenteral Nutrition (CENTRAL) Stopped (03/25/22 1207)     PRN Meds:.baclofen, clobetasol, fluticasone propionate, HYDROmorphone, HYDROmorphone, hydrOXYzine, hyoscyamine, melatonin, naloxone, OLANZapine, ondansetron, oxyCODONE, phenol, simethicone     Objective:   Vital signs:   Temp:  [36.5 ??C (97.7 ??F)-37.6 ??C (99.7 ??F)] 37 ??C (98.6 ??F)  Pulse:  [85-101] 100  Resp:  [17-18] 18  BP: (102-125)/(49-65) 109/52  MAP (mmHg):  [64-83] 67  SpO2:  [98 %-99 %] 98 %    Physical Exam:  Gen: No acute  distress.  Pulm: Normal work of breathing.  Neuro/MSK: Normal bulk/tone. Gait/station deferred.  Skin: normal skin tone.    Mental Status Exam:  Appearance:  appears stated age and clean/Neat   Attitude:   calm and cooperative   Behavior/Psychomotor:  appropriate eye contact and no abnormal movements   Speech/Language:   normal rate, volume, tone, fluency and language intact, well formed   Mood:  ???ok???   Affect:  constricted, mood congruent, sad and guarded   Thought process:  logical, linear, clear, coherent, goal directed   Thought content:    denies thoughts of self-harm. Denies SI, plans, or intent. Denies HI.  No grandiose, self-referential, persecutory, or paranoid delusions noted.   Perceptual disturbances:   denies auditory and visual hallucinations and behavior not concerning for response to internal stimuli   Attention:  able to fully attend without fluctuations in consciousness   Concentration:  Able to fully concentrate and attend   Orientation:  Oriented to person, place, city, date, month, year and situation.   Memory:  not formally tested, but grossly intact   Fund of knowledge:   not formally assessed   Insight:    Fair   Judgment:   Fair   Impulse Control:  Fair       Data Reviewed:  I reviewed labs from the last 24 hours.     Additional Psychometric Testing:  Not applicable.    Consult Type and Time-Based Documentation:  This patient was evaluated in person.    Time-based billing disclaimer:  I personally spent 30   minutes face-to-face and non-face-to-face in the care of this patient, which includes all pre, intra, and post visit time on the date of service. ??All documented time was specific to the E/M visit and does not include any procedures that may have been performed.

## 2022-03-25 NOTE — Unmapped (Signed)
PARENTERAL NUTRITION PROGRESS NOTE     Requesting Attending Physician :  [name] Cree, MD    Reason for Consult:    [name] [name] is a 22 y.o. female seen in consultation at the request of Dr. [name], MD for evaluation of  parenteral nutrition.    NUTRITION ASSESSMENT     Anthropometric Data:  Height: 165.1 cm (5' 5)   Admission weight: 58.5 kg (129 lb)  Last recorded weight: 58.8 kg (129 lb 9.6 oz)  IBW: 56.75 kg  Percent IBW: 103.59 %  BMI: Body mass index is 21.57 kg/m??.   Usual Body Weight:  ~118#     Weight history prior to admission:  pt/mom are still concerned about fluid overload despite home tpn volume only being 1260 mL    Wt Readings from Last 10 Encounters:   03/11/22 58.8 kg (129 lb 9.6 oz)   02/28/22 60.3 kg (133 lb)   02/26/22 59.5 kg (131 lb 1.6 oz)   02/19/22 58.9 kg (129 lb 14.4 oz)   02/06/22 55.8 kg (123 lb)   01/29/22 57.4 kg (126 lb 9.6 oz)   01/28/22 53.8 kg (118 lb 8 oz)   01/20/22 51.3 kg (113 lb)   01/02/22 52.3 kg (115 lb 6.4 oz)   12/31/21 52.2 kg (115 lb 1.6 oz)        Weight changes this admission:   Last 5 Recorded Weights    03/09/22 1944 03/11/22 0253   Weight: 58.5 kg (129 lb) 58.8 kg (129 lb 9.6 oz)        Nutrition Focused Physical Exam:  Nutrition Focused Physical Exam:                        Nutrition Evaluation  Overall Impressions: Nutrition-Focused Physical Exam not indicated due to lack of malnutrition risk factors. (03/11/22 1221)  Nutrition Designation: Normal weight (BMI 18.50 - 24.99 kg/m2) (03/11/22 1221)      NUTRITIONALLY RELEVANT DATA     Parenteral Nutrition Formula:  Recent TPN Orders (Show up to 1 orders ; newest on the right.)       Start date and time  03/26/2022 0000       Parenteral Nutrition (CENTRAL) [[id number]]       linked to   fat emulsion 20 % with fish oil (SMOFlipid) infusion 250 mL     Order Status Active     Frequency Continuous        Macro Ingredients    amino acid (CLINISOL SF) 15% 75 g     dextrose 200 g     fat emulsion 20 % with fish oil 250 mL *        Electrolytes    sodium acetate 90 mEq     potassium acetate 90 mEq     potassium phosphate 18 mmol     magnesium sulfate dilution 6 mEq     calcium gluconate 15 mEq        Additives    multivitamin, adult injection 10 mL     trace elements (TRALEMENT) Zn-Cu-Mn-Se 1 mL        Medications    thiamine 180 mg        QS Base    sterile water 270.27 mL        Energy Contribution    Proteins 300 kcal     Dextrose 680 kcal     Lipids 500 kcal *  Total 1,480 kcal        Electrolyte Ion Calculated Amount    Sodium 90 mEq     Potassium 116.4 mEq     Calcium 15 mEq     Magnesium 6 mEq     Aluminum --     Phosphate 18 mmol     Chloride --     Acetate 180 mEq     Chloride: Acetate Ratio --        Trace Elements    Copper 0.3 mg     Manganese 55 mcg     Selenium 60 mcg     Zinc 3 mg        Other    Total Amino Acid 75 g     Total Amino Acid/kg 1.28 g/kg     Glucose Infusion Rate 4.72 mg/kg/min     Osmolarity 1,761.66     Volume 1,200 mL     Rate 100 mL/hr     Dosing Weight 58.8 kg     Infusion Site Central     Total Multi-vitamins 10 mL        Admin Instructions      Please infuse using a parenteral nutrition tubing set (non-DEHP) with a 1.2-micron filter. Listed in [place] as Set IV for Parenteral Nutrition # [id number].  central line only           * Lipid contribution from the linked fat emulsion order. Any non-lipid contribution from the associated lipid order is not included.              Labs:   Results in Past 7 Days  Result Component Current Result   Sodium 141 (03/25/2022)   Potassium 3.9 (03/25/2022)   Chloride 109 (H) (03/25/2022)   CO2 28.0 (03/25/2022)   BUN 15 (03/25/2022)   Creatinine 0.45 (L) (03/25/2022)   Glucose 108 (03/25/2022)   Calcium 8.8 (03/25/2022)   Ionized Calcium Not in Time Range   Magnesium 2.0 (03/25/2022)   Phosphorus 4.3 (03/25/2022)   Albumin 2.9 (L) (03/25/2022)   Total Bilirubin <0.2 (L) (03/25/2022)   Bilirubin, Direct Not in Time Range   AST 143 (H) (03/25/2022) ALT 130 (H) (03/25/2022)   Alkaline Phosphatase 57 (03/25/2022)   Triglycerides 110 (03/25/2022)         Nutrition History:   1/16: has been cycled and tolerating at goal since 1/4. Went a few days without lipids as team attempting lidocaine infusion. Do not believe current LFTs to be necessarily secondary to TPN, pt already on SMOF lipids, cycled, TG WNL, all therapies for elevated LFTs already in place.     March 11, 2022: Prior to admission:  Visisted pt at bedside, mom present. They remain concerned about fluid overload despite tpn being maximally concentrated. Last infused TPN Fri-Sat (12/29-12/30) - recently, tpn was made 5 days/week. Starting at continuous here.     Allergies, Intolerances, Sensitivities, and/or Cultural/Religious Dietary Restrictions: include lasix, iv iron     Current Nutrition:  Parenteral nutrition via PICC   Placement Date: 01/10/22   Placed by: Venous Access Team  Double lumen  Tip located in the Appropriate Tip Location Confirmed by [employer] and [place] for use by the Venous Access Team (VAT)          Nutritional Needs:   Daily Estimated Nutrient Needs:  Energy: 1425-1710 kcals 25-30 kcal/kg using ideal body weight, 57 kg (03/11/22 1224)]  Protein: 57-86 gm [1.0-1.2 gm/kg, 1.2-1.5 gm/kg using ideal body weight, 57 kg (  03/11/22 1224)]  Carbohydrate:   [no restriction]  Fluid: pt requests </= 1200 mL/day total mL [per MD team]      Malnutrition Assessment using AND/ASPEN Clinical Characteristics:    Patient does not meet AND/ASPEN criteria for malnutrition at this time (03/11/22 1226)       GOALS and EVALUATION     Patient to meet 80% or greater of nutritional needs via parenteral nutrition within 5 days.  - Meeting/Ongoing    Motivation, Barriers, and Compliance:  Evaluation of motivation, barriers, and compliance completed. No concerns identified at this time.     NUTRITION ASSESSMENT     Patient appropriate for parenteral nutrition based on the following ASPEN criteria of lack of gut function .      Discharge Planning:   Monitor for potential discharge needs with multi-disciplinary team.       NUTRITION INTERVENTIONS and RECOMMENDATION     Renew TPN daily   Tolerating cycled TPN, please notify RD 48 hours prior to discharge to allow for discharge planning      Follow-Up Parameters:   Monitor daily while on TPN    Forestine Chute MPH, RD, CNSC, LDN   Pager: [phone]  Phone: [phone]  [email or web address]

## 2022-03-25 NOTE — Unmapped (Signed)
Alert, oriented x 4. VSS. Reports one episode of  hematochezia yesterday with considerable blood in toilet water- none since. Last H&H drawn at 2130 8.2. CBC pending with am labs. Responding well to present analgesic regime. TPN/Lipids hung at 2400. Mom at bedside.    Problem: Adult Inpatient Plan of Care  Goal: Plan of Care Review  03/25/2022 0437 by Alfredo Bach, RN  Outcome: Progressing  Goal: Patient-Specific Goal (Individualized)  03/25/2022 0437 by Alfredo Bach, RN  Outcome: Progressing  Goal: Absence of Hospital-Acquired Illness or Injury  03/24/2022 0437 by Alfredo Bach, RN  Outcome: Progressing  Intervention: Identify and Manage Fall Risk  Recent Flowsheet Documentation  Taken 03/24/2022 2000 by Alfredo Bach, RN  Safety Interventions: family at bedside  Goal: Optimal Comfort and Wellbeing  03/25/2022 0437 by Alfredo Bach, RN  Outcome: Progressing  Goal: Readiness for Transition of Care  03/25/2022 0437 by Alfredo Bach, RN  Outcome: Progressing  Goal: Rounds/Family Conference  03/25/2022 0437 by Alfredo Bach, RN  Outcome: Progressing  Problem: Adult Inpatient Plan of Care  Goal: Optimal Comfort and Wellbeing  03/25/2022 0437 by Alfredo Bach, RN  Outcome: Progressing  Problem: Pain Acute  Goal: Optimal Pain Control and Function  03/25/2022 0437 by Alfredo Bach, RN  Outcome: Progressing

## 2022-03-26 LAB — COMPREHENSIVE METABOLIC PANEL
ALBUMIN: 3 g/dL — ABNORMAL LOW (ref 3.4–5.0)
ALBUMIN: 2.8 g/dL — ABNORMAL LOW (ref 3.4–5.0)
ALKALINE PHOSPHATASE: 55 U/L (ref 46–116)
ALKALINE PHOSPHATASE: 48 U/L (ref 46–116)
ALT (SGPT): 85 U/L — ABNORMAL HIGH (ref 10–49)
ALT (SGPT): 74 U/L — ABNORMAL HIGH (ref 10–49)
ANION GAP: 5 mmol/L (ref 5–14)
ANION GAP: 2 mmol/L — ABNORMAL LOW (ref 5–14)
AST (SGOT): 42 U/L — ABNORMAL HIGH (ref <=34)
AST (SGOT): 36 U/L — ABNORMAL HIGH (ref <=34)
BILIRUBIN TOTAL: <0.2 mg/dL — ABNORMAL LOW (ref 0.3–1.2)
BILIRUBIN TOTAL: <0.2 mg/dL — ABNORMAL LOW (ref 0.3–1.2)
BLOOD UREA NITROGEN: 14 mg/dL (ref 9–23)
BLOOD UREA NITROGEN: 16 mg/dL (ref 9–23)
BUN / CREAT RATIO: 28
BUN / CREAT RATIO: 35
CALCIUM: 8.6 mg/dL — ABNORMAL LOW (ref 8.7–10.4)
CALCIUM: 8.8 mg/dL (ref 8.7–10.4)
CHLORIDE: 108 mmol/L — ABNORMAL HIGH (ref 98–107)
CHLORIDE: 107 mmol/L (ref 98–107)
CO2: 26 mmol/L (ref 20.0–31.0)
CO2: 29 mmol/L (ref 20.0–31.0)
CREATININE: 0.5 mg/dL — ABNORMAL LOW
CREATININE: 0.46 mg/dL — ABNORMAL LOW
EGFR CKD-EPI (2021) FEMALE: >90 mL/min/{1.73_m2} (ref >=60)
EGFR CKD-EPI (2021) FEMALE: >90 mL/min/{1.73_m2} (ref >=60)
GLUCOSE RANDOM: 80 mg/dL (ref 70–179)
GLUCOSE RANDOM: 99 mg/dL (ref 70–179)
POTASSIUM: 3.3 mmol/L — ABNORMAL LOW (ref 3.4–4.8)
POTASSIUM: 3.7 mmol/L (ref 3.4–4.8)
PROTEIN TOTAL: 5.5 g/dL — ABNORMAL LOW (ref 5.7–8.2)
PROTEIN TOTAL: 5.5 g/dL — ABNORMAL LOW (ref 5.7–8.2)
SODIUM: 139 mmol/L (ref 135–145)
SODIUM: 138 mmol/L (ref 135–145)

## 2022-03-26 LAB — HEMOGLOBIN AND HEMATOCRIT, BLOOD
HEMATOCRIT: 23.6 % — ABNORMAL LOW (ref 34.0–44.0)
HEMOGLOBIN: 7.9 g/dL — ABNORMAL LOW (ref 11.3–14.9)

## 2022-03-26 LAB — B-TYPE NATRIURETIC PEPTIDE: B-TYPE NATRIURETIC PEPTIDE: 21 pg/mL (ref <=100)

## 2022-03-26 MED ADMIN — cholecalciferol (vitamin D3 25 mcg (1,000 units)) tablet 25 mcg: 25 ug | ORAL | @ 14:00:00

## 2022-03-26 MED ADMIN — furosemide (LASIX) tablet 20 mg: 20 mg | ORAL | @ 18:00:00 | Stop: 2022-03-26

## 2022-03-26 MED ADMIN — pregabalin (LYRICA) capsule 25 mg: 25 mg | ORAL | @ 01:00:00

## 2022-03-26 MED ADMIN — pantoprazole (Protonix) injection 40 mg: 40 mg | INTRAVENOUS | @ 01:00:00

## 2022-03-26 MED ADMIN — acetaminophen (TYLENOL) tablet 1,000 mg: 1000 mg | ORAL | @ 18:00:00

## 2022-03-26 MED ADMIN — hydrOXYzine (ATARAX) tablet 50 mg: 50 mg | ORAL | @ 22:00:00

## 2022-03-26 MED ADMIN — HYDROmorphone (PF) injection Syrg 0.15 mg: .15 mg | INTRAVENOUS | @ 06:00:00 | Stop: 2022-03-26

## 2022-03-26 MED ADMIN — OLANZapine zydis (ZyPREXA) disintegrating tablet 7.5 mg: 7.5 mg | ORAL | @ 01:00:00

## 2022-03-26 MED ADMIN — oxyCODONE (ROXICODONE) immediate release tablet 10 mg: 10 mg | ORAL | @ 18:00:00 | Stop: 2022-04-06

## 2022-03-26 MED ADMIN — oxyCODONE (ROXICODONE) immediate release tablet 10 mg: 10 mg | ORAL | @ 04:00:00 | Stop: 2022-04-06

## 2022-03-26 MED ADMIN — acetaminophen (TYLENOL) tablet 1,000 mg: 1000 mg | ORAL | @ 10:00:00

## 2022-03-26 MED ADMIN — fat emulsion 20 % with fish oil (SMOFlipid) infusion 250 mL: 250 mL | INTRAVENOUS | @ 05:00:00 | Stop: 2022-03-26

## 2022-03-26 MED ADMIN — HYDROmorphone (DILAUDID) tablet 2 mg: 2 mg | ORAL | @ 01:00:00 | Stop: 2022-03-29

## 2022-03-26 MED ADMIN — Parenteral Nutrition (CENTRAL): INTRAVENOUS | @ 05:00:00 | Stop: 2022-03-26

## 2022-03-26 MED ADMIN — oxyCODONE (ROXICODONE) immediate release tablet 10 mg: 10 mg | ORAL | @ 14:00:00 | Stop: 2022-04-06

## 2022-03-26 MED ADMIN — pregabalin (LYRICA) capsule 25 mg: 25 mg | ORAL | @ 18:00:00

## 2022-03-26 MED ADMIN — mirtazapine (REMERON) tablet 7.5 mg: 7.5 mg | ORAL | @ 01:00:00

## 2022-03-26 MED ADMIN — oxyCODONE (ROXICODONE) immediate release tablet 10 mg: 10 mg | ORAL | @ 22:00:00 | Stop: 2022-04-06

## 2022-03-26 MED ADMIN — famotidine (PEPCID) tablet 20 mg: 20 mg | ORAL | @ 01:00:00

## 2022-03-26 MED ADMIN — pregabalin (LYRICA) capsule 25 mg: 25 mg | ORAL | @ 14:00:00

## 2022-03-26 MED ADMIN — hydrOXYzine (ATARAX) tablet 50 mg: 50 mg | ORAL | @ 14:00:00

## 2022-03-26 MED ADMIN — pantoprazole (Protonix) injection 40 mg: 40 mg | INTRAVENOUS | @ 14:00:00 | Stop: 2022-03-26

## 2022-03-26 MED ADMIN — acetaminophen (TYLENOL) tablet 1,000 mg: 1000 mg | ORAL | @ 04:00:00

## 2022-03-26 MED ADMIN — ciprofloxacin HCl (CIPRO) tablet 500 mg: 500 mg | ORAL | @ 01:00:00 | Stop: 2022-03-25

## 2022-03-26 MED ADMIN — polyethylene glycol (MIRALAX) packet 17 g: 17 g | ORAL | @ 14:00:00

## 2022-03-26 NOTE — Unmapped (Signed)
Internal Medicine (MEDW) Progress Note    Assessment & Plan:   [name] [name] is a 22 y.o. 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      Principal Problem:    GI bleeding  Active Problems:    [name]    [name]    Pain    Iron deficiency anemia due to chronic blood loss    Severe protein-calorie malnutrition (CMS-HCC)    Lower abdominal pain    Generalized anxiety disorder with panic attacks    MDD (major depressive disorder)    Small intestinal bacterial overgrowth (SIBO)    Ileus (CMS-HCC)  Resolved Problems:    * No resolved hospital problems. *    Active Problems    Acute on Chronic Abdominal Pain - Desmoid Tumors - Ileus (resolved) - Narcotic Bowel  S/p 14 day treatment of SIBO with cipro. Received 1 x IV dilaudid overnight for increased pain. She is concerned that she could be retaining fluid from her TPN and this is causing her discomfort. No evidence of hypervolemia on exam.   - Low residue diet  - Continue daily MiraLax  - Will obtain BNP and trial 1x lasix, although exam is reassuring against hypervolemia  - Pain Regimen:   - Continue Lyrica 25 mg TID    - s/p Lidocaine infusion x 3 days (EOT 1/11)   - Continue Butrans patch 10 mcg (Flonase admin to skin prior) q7 days    - Dilaudid 2 mg oral q4 hours PRN  - Liquid Tylenol 1,000 mg q8hr  - Oxy 10mg  q6h PRN  - Discontinue Gabapentin 200mg  at bedtime  - Baclofen 5mg  TID PRN     BRBPR I Acute on Chronic Anemia  Hematochezia noted 1/15 in the setting of known ileoanal anastomosis site ulcer. Hgb remains stable. Lexapro stopped 1/14 given known platelet interaction.   - Maintain active type and screen and adequate IV access with at least 2 large bore IV  - Transfuse Hgb <7    Transaminitis (resolved)  AST/ALT acutely elevated (143, 130, respectively) on 1/16. Repeat labs improved. Likely spurious lab value.     Severe Malnutrition 2/2 [name] and abdominal desmoid tumors   She is currently receiving TPN 5/7 days of the week and is being followed by Dr. [name], outpatient GI. Taking small amounts of oral intake.  - TPN consult  - Low residue diet     Anxiety - Panic Attacks  Pt in talks with Ad[facility]cent [facility] Adult program at [facility]. Psychiatry following. Suspect episodes of pain and timing surrounding medications precipitate episodes of anxiety. 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  Barrier to discharge remains use of IV opioids.    Team Contact Information:   Primary Team: Internal Medicine (MEDW)  Primary Resident: Marda Stalker, MD  Resident's Pager: [phone] [facility])    Interval History:     Had pain in her RUQ overnight and received 1x IV dilaudid. She is concerned that she is retaining too much fluid from TPN and this is contributing to her pain. She reports regular bowel movements and does not feel like her pain is from constipation. She has previously been told she was volume-overloaded while on TPN and required diuresis to improve her symptoms.     Objective:   Temp:  [36.6 ??C (97.9 ??F)-37.2 ??C (99 ??F)] 37 ??C (98.6 ??F)  Pulse:  [83-106] 83  Resp:  [16-18] 16  BP: (103-120)/(52-65) 103/56  SpO2:  [98 %-100 %] 98 %    Gen: NAD, sitting upright in bed, appears tired   HENT: atraumatic, normocephalic  Heart: RRR, no JVD  Lungs: No increased WOB on RA, no rales  Abdomen: soft, mildly distended. TTP without guarding   Extremities: No edema    Theda Sers, MD  PGY-1, [facility] Internal Medicine

## 2022-03-26 NOTE — Unmapped (Cosign Needed)
Department of Anesthesiology  Pain Medicine Division    Chronic Pain Followup Inpatient Follow-Up Note    Requesting Attending Physician:  [name], MD  Service Requesting Consult:  [facility] (MDW)    Assessment/Recommendations:  22 y.o. 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 Follows with [name] at [facility].    Our service is re-engaging with Ms. Staron's care as she has had an exacerbation in her abdominal pain after discontinuation of her hydromorphone IV on 1/5, the exacerbation of abdominal pain over the weekend was likely multifactorial in etiology - there were concerns of possible, transient SBO at a site of tethering in the small intestine on  CT and an ongoing, opioid-induced ileus. This exacerbation in pain necessitating reinitiation of IV hydromorphone.  She states that her pain is currently appropriately controlled at this time.  However, given concerns of her ongoing ileus and escalation of opioids; the primary team had wanted our team to reengage for assistance in reducing opioid requirements.    She did well with a lidocaine infusion at 1.5 mg/kg/hr over 72 hours.     Her primary pain generators include acute abdominal pain from possible transient small bowel obstruction, chronic pain in the setting of multiple abdominal surgeries and desmoid tumors.    Recommendations:  -The chronic pain service is a consult service and does not place orders, just makes recommendations (except ketamine and lidocaine infusions)   -Please evaluate all patients on opioids for appropriateness of prescribing narcan at discharge.  The chronic pain service can assist with this.  Nasal narcan is covered by most insurances.  -Recommendations given apply to the current hospitalization and do not reflect long term recommendations.    Changes to current analgesic regimen:  - None today    Continue without changes:  -Continue Butrans 10 mcg/hr patch every 7 days scheduled (please spray flonase onto site of patch application in order to prevent hypersensitivity that is adhesive-related; please ensure that patch alternates sites). Discussed increasing Butrans to 15 mcg/hr if she desires. This would be acceptable if patient wants to make this change. It may help her with weaning Oxycodone, this is her stated goal.  -Continue PO HM 2mg  q4h prn for breakthrough pain  -Continue Lyrica 25mg  tid. Consider 25mg  AM and 50mg  PM. May assist with daytime somnolence.  -Continue acetaminophen 1000 mg oral solution every 8 hours scheduled  -Continue diclofenac 1% gel topically 4 times a day scheduled  -Continue baclofen 5 mg oral tablet 3 times a day as needed for muscle spasms  -Continue oxycodone 10 mg oral tablet every 4 hours as needed for moderate to severe pain  -Continue naloxone intranasal as needed in the event of opioid induced respiratory depression or oversedation  -Continue engaging with pain psychology as needed (reinforced to patient that she will have to advocate for herself and let us know if she would like to set up an appointment)    Follow-up with [facility] on 04/01/2022 as an outpatient if discharged from the hospital.     Home MME: 135  Current MME: 72 + Butrans 10 mcg/h patch     We will continue to follow.    Naloxone Rx at discharge?  Is patient on opioids? Yes.  1)Is dose >50MME?  Yes.  2) Is patient prescribed a benzodiazepine (w opioids)? No.  3)Hx of overdose?  No.  4) Hx of substance use disorder? No.  5) Opioids likely to last greater than a week after discharge? Yes.    If yes to 2 or more, prescribe naloxone at discharge.  Nasal narcan for most insured (Nasal narcan 4mg /actuation, prescribe 1 kit, instructions at [email or web address]).  For uninsured, chronic pain can work to assist in finding an option. OTC nasal narcan now available at most pharmacies for around $45.    Interim History  There were no Acute Events Overnight.  The patient is obtaining adequate pain relief on current medication regimen and feels that their pain is Somewhat controlled    H/H recovering today. Pain is better controlled. She required a 1x dose of IV HM overnight, but has done well since formally discontinuing the standing prn order. She inquires about changing the timing or dosing of Lyrica and we discussed some options. We also discussed that there is room to increase the Butrans patch in hopes of weaning off Oxycodone. She would like to think about this today.    Analgesia Evaluation:  Pain at minimum: 4/10  Pain at maximum: 9/10    Inpatient Medications  Current Facility-Administered Medications   Medication Dose Route Frequency Provider Last Rate Last Admin    acetaminophen (TYLENOL) tablet 1,000 mg  1,000 mg Oral Q8H Hendrick, Rushie Chestnut, MD   1,000 mg at 03/26/22 1319    baclofen (LIORESAL) tablet 5 mg  5 mg Oral TID PRN Linna Hoff, MD   5 mg at 03/22/22 1639    buprenorphine 10 mcg/hour transdermal patch 1 patch  1 patch Transdermal Q7 Days Cleotis Nipper, MD   1 patch at 03/22/22 1053    cholecalciferol (vitamin D3 25 mcg (1,000 units)) tablet 25 mcg  25 mcg Oral Daily Cleotis Nipper, MD   25 mcg at 03/26/22 0900    clobetasol (TEMOVATE) 0.05 % ointment   Topical BID PRN Linna Hoff, MD        diclofenac sodium (VOLTAREN) 1 % gel 2 g  2 g Topical QID Linna Hoff, MD   2 g at 03/20/22 1109    famotidine (PEPCID) tablet 20 mg  20 mg Oral Nightly Achille Rich, MD   20 mg at 03/25/22 2025    fluticasone propionate (FLONASE) 50 mcg/actuation nasal spray 1 spray  1 spray Topical Daily PRN Muthukkumar, Rashmi, MD        HYDROmorphone (DILAUDID) tablet 2 mg  2 mg Oral Q4H PRN Cleotis Nipper, MD   2 mg at 03/25/22 2024    hydrOXYzine (ATARAX) tablet 50 mg  50 mg Oral Q6H PRN Cleotis Nipper, MD   50 mg at 03/26/22 0900    hyoscyamine (LEVSIN) tablet 125 mcg  125 mcg Oral Q4H PRN Hollie Beach P, DO        lactated Ringers infusion  10 mL/hr Intravenous Continuous Siddiqui, Zia A, MD        melatonin tablet 3 mg  3 mg Oral Nightly PRN Linna Hoff, MD        mirtazapine (REMERON) tablet 7.5 mg  7.5 mg Oral Nightly Hollie Beach P, DO   7.5 mg at 03/25/22 2025    naloxone (NARCAN) injection 0.4 mg  0.4 mg Intravenous Once PRN Linna Hoff, MD        OLANZapine (ZyPREXA) tablet 1.25 mg  1.25 mg Oral Daily PRN Muthukkumar, Trudee Grip, MD  OLANZapine zydis (ZyPREXA) disintegrating tablet 7.5 mg  7.5 mg Oral Nightly Goldbeck, Lauren D, MD   7.5 mg at 03/25/22 2027    ondansetron (ZOFRAN-ODT) disintegrating tablet 8 mg  8 mg Oral Q12H PRN Hollie Beach P, DO   8 mg at 03/21/22 0913    oxyCODONE (ROXICODONE) immediate release tablet 10 mg  10 mg Oral Q4H PRN Boccaccio, Dominic E, MD   10 mg at 03/26/22 1319    [START ON 03/27/2022] pantoprazole (Protonix) EC tablet 40 mg  40 mg Oral Daily Marda Stalker, MD        Parenteral Nutrition (CENTRAL)   Intravenous Continuous [name], MD   Stopped at 03/26/22 1100    phenol (CHLORASEPTIC) 1.4 % spray 2 spray  2 spray Mucous Membrane Q2H PRN Goldbeck, Eliott Nine, MD        polyethylene glycol (MIRALAX) packet 17 g  17 g Oral Daily Cleotis Nipper, MD   17 g at 03/26/22 0900    pregabalin (LYRICA) capsule 25 mg  25 mg Oral TID Cleotis Nipper, MD   25 mg at 03/26/22 1319    simethicone (MYLICON) chewable tablet 80 mg  80 mg Oral Q6H PRN Achille Rich, MD   80 mg at 03/14/22 1810         Objective:     Vital Signs    Temp:  [36.9 ??C (98.4 ??F)-37.2 ??C (99 ??F)] 36.9 ??C (98.4 ??F)  Pulse:  [83-101] 92  Resp:  [16-18] 18  BP: (103-125)/(56-67) 125/67  MAP (mmHg):  [69-83] 83  SpO2:  [98 %-100 %] 99 %      Physical Exam    GENERAL:  Well developed, well-nourished female and is in mild distress.   HEAD/NECK:    Reveals normocephalic/atraumatic. CARDIOVASCULAR:   RRR, no murmur  LUNGS:   Normal work of breathing, no supplemental 02  EXTREMITIES:  Warm, no clubbing, cyanosis, or edema was noted.  NEUROLOGIC:    The patient was alert and oriented times four with normal language, attention, cognition and memory. Cranial nerve exam was grossly normal.    MUSCULOSKELETAL:    Moving all extremities  SKIN:  No obvious rashes lesions or erythema  PSY:  Appropriate affect and mood.    Test Results    Lab Results   Component Value Date    CREATININE 0.46 (L) 03/26/2022     Lab Results   Component Value Date    ALKPHOS 48 03/26/2022    BILITOT <0.2 (L) 03/26/2022    BILIDIR <0.10 03/17/2022    PROT 5.5 (L) 03/26/2022    ALBUMIN 2.8 (L) 03/26/2022    ALT 74 (H) 03/26/2022    AST 36 (H) 03/26/2022           Problem List    Principal Problem:    GI bleeding  Active Problems:    [name]    [name]    Pain    Iron deficiency anemia due to chronic blood loss    Severe protein-calorie malnutrition (CMS-HCC)    Lower abdominal pain    Generalized anxiety disorder with panic attacks    MDD (major depressive disorder)    Small intestinal bacterial overgrowth (SIBO)    Ileus (CMS-HCC)

## 2022-03-26 NOTE — Unmapped (Signed)
Patient alert and oriented x 4.  Patient complaining of abdominal pain and feeling of abdominal distention.  Provider notified of patient complaints and came to bedside to assess patient.  One time dose of IV pain med ordered and administered to patient.  Patient receiving TPN and lipids per order.    Problem: Gastrointestinal Bleeding  Goal: Optimal Coping with Acute Illness  Outcome: Progressing     Problem: Adult Inpatient Plan of Care  Goal: Readiness for Transition of Care  Outcome: Progressing     Problem: Pain Acute  Goal: Optimal Pain Control and Function  Outcome: Progressing

## 2022-03-26 NOTE — Unmapped (Addendum)
A/O x 4, afebrile, VSS on RA, denies pain at this time or PRN meds given for pain (see MAR). IV clean, dry, intact, and flushed per protocol. Falls precautions maintained. Call bell and bedside table within reach, bed in lowest position and locked. No falls or injuries noted this shift. No bloody BM during the shift.  Problem: Adult Inpatient Plan of Care  Goal: Plan of Care Review  Outcome: Ongoing - Unchanged  Goal: Patient-Specific Goal (Individualized)  Outcome: Ongoing - Unchanged  Goal: Absence of Hospital-Acquired Illness or Injury  Outcome: Ongoing - Unchanged  Intervention: Identify and Manage Fall Risk  Recent Flowsheet Documentation  Taken 03/26/2022 0730 by Johnna Acosta, RN  Safety Interventions: low bed  Intervention: Prevent Infection  Recent Flowsheet Documentation  Taken 03/26/2022 0730 by Johnna Acosta, RN  Infection Prevention:   cohorting utilized   environmental surveillance performed  Goal: Optimal Comfort and Wellbeing  Outcome: Ongoing - Unchanged  Goal: Readiness for Transition of Care  Outcome: Ongoing - Unchanged  Goal: Rounds/Family Conference  Outcome: Ongoing - Unchanged

## 2022-03-26 NOTE — Unmapped (Signed)
[facility] Specialty Medication Onboarding    Specialty Medication: Ogsiveo 50 mg tablets  Prior Authorization: Approved   Financial Assistance: Yes - grant approved as secondary   Final Copay/Day Supply: $0 / 30    Insurance Restrictions: Unable to test. 30 day rx.    Notes to Pharmacist:     The triage team has completed the benefits investigation and has determined that the patient is able to fill this medication at [facility]. Please contact the patient to complete the onboarding or follow up with the prescribing physician as needed.

## 2022-03-27 DIAGNOSIS — E860 Dehydration: Secondary | ICD-10-CM | POA: Diagnosis not present

## 2022-03-27 DIAGNOSIS — R112 Nausea with vomiting, unspecified: Secondary | ICD-10-CM | POA: Diagnosis not present

## 2022-03-27 DIAGNOSIS — Z932 Ileostomy status: Secondary | ICD-10-CM | POA: Diagnosis not present

## 2022-03-27 DIAGNOSIS — R1084 Generalized abdominal pain: Secondary | ICD-10-CM | POA: Diagnosis not present

## 2022-03-27 LAB — COMPREHENSIVE METABOLIC PANEL
ALBUMIN: 3 g/dL — ABNORMAL LOW (ref 3.4–5.0)
ALKALINE PHOSPHATASE: 45 U/L — ABNORMAL LOW (ref 46–116)
ALT (SGPT): 56 U/L — ABNORMAL HIGH (ref 10–49)
ANION GAP: 4 mmol/L — ABNORMAL LOW (ref 5–14)
AST (SGOT): 33 U/L (ref <=34)
BILIRUBIN TOTAL: 0.2 mg/dL — ABNORMAL LOW (ref 0.3–1.2)
BLOOD UREA NITROGEN: 13 mg/dL (ref 9–23)
BUN / CREAT RATIO: 25
CALCIUM: 8.5 mg/dL — ABNORMAL LOW (ref 8.7–10.4)
CHLORIDE: 108 mmol/L — ABNORMAL HIGH (ref 98–107)
CO2: 28 mmol/L (ref 20.0–31.0)
CREATININE: 0.53 mg/dL — ABNORMAL LOW
EGFR CKD-EPI (2021) FEMALE: >90 mL/min/{1.73_m2} (ref >=60)
GLUCOSE RANDOM: 88 mg/dL (ref 70–179)
POTASSIUM: 3.5 mmol/L (ref 3.4–4.8)
PROTEIN TOTAL: 5.7 g/dL (ref 5.7–8.2)
SODIUM: 140 mmol/L (ref 135–145)

## 2022-03-27 LAB — CBC
HEMATOCRIT: 24.3 % — ABNORMAL LOW (ref 34.0–44.0)
HEMOGLOBIN: 7.9 g/dL — ABNORMAL LOW (ref 11.3–14.9)
MEAN CORPUSCULAR HEMOGLOBIN CONC: 32.3 g/dL (ref 32.0–36.0)
MEAN CORPUSCULAR HEMOGLOBIN: 25.8 pg — ABNORMAL LOW (ref 25.9–32.4)
MEAN CORPUSCULAR VOLUME: 80 fL (ref 77.6–95.7)
MEAN PLATELET VOLUME: 9.1 fL (ref 6.8–10.7)
PLATELET COUNT: 190 10*9/L (ref 150–450)
RED BLOOD CELL COUNT: 3.04 10*12/L — ABNORMAL LOW (ref 3.95–5.13)
RED CELL DISTRIBUTION WIDTH: 15.1 % (ref 12.2–15.2)
WBC ADJUSTED: 5 10*9/L (ref 3.6–11.2)

## 2022-03-27 LAB — MAGNESIUM: MAGNESIUM: 2.1 mg/dL (ref 1.6–2.6)

## 2022-03-27 LAB — PHOSPHORUS: PHOSPHORUS: 4.7 mg/dL (ref 2.4–5.1)

## 2022-03-27 MED ORDER — HYDROMORPHONE 2 MG TABLET
ORAL_TABLET | ORAL | 0 refills | 2.00000 days | Status: CP | PRN
Start: 2022-03-27 — End: 2022-04-01

## 2022-03-27 MED ORDER — HYDROXYZINE HCL 50 MG TABLET
ORAL_TABLET | Freq: Four times a day (QID) | ORAL | 0 refills | 8.00000 days | Status: CP | PRN
Start: 2022-03-27 — End: ?
  Filled 2022-03-27: qty 30, 8d supply, fill #0

## 2022-03-27 MED ORDER — PREGABALIN 25 MG CAPSULE
ORAL_CAPSULE | Freq: Three times a day (TID) | ORAL | 0 refills | 30.00000 days | Status: CP
Start: 2022-03-27 — End: 2022-04-26
  Filled 2022-03-27: qty 90, 30d supply, fill #0

## 2022-03-27 MED ORDER — OXYCODONE 10 MG TABLET
ORAL_TABLET | ORAL | 0 refills | 2.00000 days | Status: CP | PRN
Start: 2022-03-27 — End: 2022-04-01
  Filled 2022-03-27: qty 10, 2d supply, fill #0

## 2022-03-27 MED ORDER — FLUTICASONE PROPIONATE 50 MCG/ACTUATION NASAL SPRAY,SUSPENSION
NASAL | 0 refills | 0.00000 days | Status: CP
Start: 2022-03-27 — End: ?
  Filled 2022-03-27: qty 16, 30d supply, fill #0

## 2022-03-27 MED ORDER — BACLOFEN 5 MG TABLET
ORAL_TABLET | Freq: Three times a day (TID) | ORAL | 0 refills | 10.00000 days | Status: CP | PRN
Start: 2022-03-27 — End: 2022-04-26
  Filled 2022-03-27: qty 30, 10d supply, fill #0

## 2022-03-27 MED ORDER — POLYETHYLENE GLYCOL 3350 17 GRAM/DOSE ORAL POWDER
Freq: Every day | ORAL | 0 refills | 30.00000 days | Status: CP
Start: 2022-03-27 — End: 2022-04-26
  Filled 2022-03-27: qty 238, 14d supply, fill #0

## 2022-03-27 MED ADMIN — cholecalciferol (vitamin D3 25 mcg (1,000 units)) tablet 25 mcg: 25 ug | ORAL | @ 15:00:00 | Stop: 2022-03-27

## 2022-03-27 MED ADMIN — oxyCODONE (ROXICODONE) immediate release tablet 10 mg: 10 mg | ORAL | @ 20:00:00 | Stop: 2022-03-27

## 2022-03-27 MED ADMIN — oxyCODONE (ROXICODONE) immediate release tablet 10 mg: 10 mg | ORAL | @ 15:00:00 | Stop: 2022-03-27

## 2022-03-27 MED ADMIN — famotidine (PEPCID) tablet 20 mg: 20 mg | ORAL | @ 02:00:00

## 2022-03-27 MED ADMIN — OLANZapine zydis (ZyPREXA) disintegrating tablet 7.5 mg: 7.5 mg | ORAL | @ 02:00:00

## 2022-03-27 MED ADMIN — oxyCODONE (ROXICODONE) immediate release tablet 10 mg: 10 mg | ORAL | @ 02:00:00 | Stop: 2022-04-06

## 2022-03-27 MED ADMIN — pantoprazole (Protonix) EC tablet 40 mg: 40 mg | ORAL | @ 15:00:00 | Stop: 2022-03-27

## 2022-03-27 MED ADMIN — hydrOXYzine (ATARAX) tablet 50 mg: 50 mg | ORAL | @ 15:00:00 | Stop: 2022-03-27

## 2022-03-27 MED ADMIN — pregabalin (LYRICA) capsule 25 mg: 25 mg | ORAL | @ 15:00:00 | Stop: 2022-03-27

## 2022-03-27 MED ADMIN — acetaminophen (TYLENOL) tablet 1,000 mg: 1000 mg | ORAL | @ 02:00:00

## 2022-03-27 MED ADMIN — diclofenac sodium (VOLTAREN) 1 % gel 2 g: 2 g | TOPICAL | @ 02:00:00

## 2022-03-27 MED ADMIN — acetaminophen (TYLENOL) tablet 1,000 mg: 1000 mg | ORAL | @ 10:00:00 | Stop: 2022-03-27

## 2022-03-27 MED ADMIN — acetaminophen (TYLENOL) tablet 1,000 mg: 1000 mg | ORAL | @ 20:00:00 | Stop: 2022-03-27

## 2022-03-27 MED ADMIN — pregabalin (LYRICA) capsule 25 mg: 25 mg | ORAL | @ 02:00:00

## 2022-03-27 MED ADMIN — oxyCODONE (ROXICODONE) immediate release tablet 10 mg: 10 mg | ORAL | @ 10:00:00 | Stop: 2022-03-27

## 2022-03-27 MED ADMIN — pregabalin (LYRICA) capsule 25 mg: 25 mg | ORAL | @ 20:00:00 | Stop: 2022-03-27

## 2022-03-27 MED ADMIN — mirtazapine (REMERON) tablet 7.5 mg: 7.5 mg | ORAL | @ 02:00:00

## 2022-03-27 NOTE — Unmapped (Signed)
Department of Anesthesiology  Pain Medicine Division    Chronic Pain Followup Inpatient Follow-Up Note    Requesting Attending Physician:  [name], MD  Service Requesting Consult:  [facility] (MDW)    Assessment/Recommendations:  22 y.o. 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 Follows with [name] at [facility].    Our service is re-engaging with [name] [name]'s care as she has had an exacerbation in her abdominal pain after discontinuation of her hydromorphone IV on 1/5, the exacerbation of abdominal pain over the weekend was likely multifactorial in etiology - there were concerns of possible, transient SBO at a site of tethering in the small intestine on  CT and an ongoing, opioid-induced ileus. This exacerbation in pain necessitating reinitiation of IV hydromorphone.  She states that her pain is currently appropriately controlled at this time.  However, given concerns of her ongoing ileus and escalation of opioids; the primary team had wanted our team to reengage for assistance in reducing opioid requirements.    She did well with a lidocaine infusion at 1.5 mg/kg/hr over 72 hours.     Her primary pain generators include acute abdominal pain from possible transient small bowel obstruction, chronic pain in the setting of multiple abdominal surgeries and desmoid tumors.    Recommendations:  -The chronic pain service is a consult service and does not place orders, just makes recommendations (except ketamine and lidocaine infusions)   -Please evaluate all patients on opioids for appropriateness of prescribing narcan at discharge.  The chronic pain service can assist with this.  Nasal narcan is covered by most insurances.  -Recommendations given apply to the current hospitalization and do not reflect long term recommendations.    Changes to current analgesic regimen:  - None today    Continue without changes:  -Continue Butrans 10 mcg/hr patch every 7 days scheduled (please spray flonase onto site of patch application in order to prevent hypersensitivity that is adhesive-related; please ensure that patch alternates sites). Discussed increasing Butrans to 15 mcg/hr if she desires. This would be acceptable if patient wants to make this change. It may help her with weaning Oxycodone, this is her stated goal. This may be done in the outpatient setting. She has established follow-up with [facility] pain clinic and prescriptions available at the pharmacy to fill.  -Continue PO HM 2mg  q4h prn for breakthrough pain  -Continue Lyrica 25mg  tid. Consider 25mg  AM and 50mg  PM. May assist with daytime somnolence.  -Continue acetaminophen 1000 mg oral solution every 8 hours scheduled  -Continue diclofenac 1% gel topically 4 times a day scheduled  -Continue baclofen 5 mg oral tablet 3 times a day as needed for muscle spasms  -Continue oxycodone 10 mg oral tablet every 4 hours as needed for moderate to severe pain  -Continue naloxone intranasal as needed in the event of opioid induced respiratory depression or oversedation  -Continue engaging with pain psychology as needed (reinforced to patient that she will have to advocate for herself and let us know if she would like to set up an appointment)    Follow-up with [facility] on 04/01/2022 as an outpatient if discharged from the hospital.     Home MME: 135  Current MME: 72 + Butrans 10 mcg/h patch     We will sign off at this time.  Please contact the  chronic pain service if we can be of further assistance with pain control.    Naloxone Rx at discharge?  Is patient on opioids? Yes.  1)Is dose >50MME?  Yes.  2) Is patient prescribed a benzodiazepine (w opioids)? No.  3)Hx of overdose?  No.  4) Hx of substance use disorder? No.  5) Opioids likely to last greater than a week after discharge? Yes.    If yes to 2 or more, prescribe naloxone at discharge.  Nasal narcan for most insured (Nasal narcan 4mg /actuation, prescribe 1 kit, instructions at [email or web address]).  For uninsured, chronic pain can work to assist in finding an option.  OTC nasal narcan now available at most pharmacies for around $45.    Interim History  There were no Acute Events Overnight.  The patient is obtaining adequate pain relief on current medication regimen and feels that their pain is Controlled    Patient plans to discharge home today. Has scripts for what she needs and planned follow-up with [facility] pain clinic.    Analgesia Evaluation:  Pain at minimum: 3/10  Pain at maximum: 8/10    Inpatient Medications  Current Facility-Administered Medications   Medication Dose Route Frequency Provider Last Rate Last Admin    acetaminophen (TYLENOL) tablet 1,000 mg  1,000 mg Oral Q8H Cleotis Nipper, MD   1,000 mg at 03/27/22 0516    baclofen (LIORESAL) tablet 5 mg  5 mg Oral TID PRN Linna Hoff, MD   5 mg at 03/22/22 1639    buprenorphine 10 mcg/hour transdermal patch 1 patch  1 patch Transdermal Q7 Days Cleotis Nipper, MD   1 patch at 03/22/22 1053    cholecalciferol (vitamin D3 25 mcg (1,000 units)) tablet 25 mcg  25 mcg Oral Daily Cleotis Nipper, MD   25 mcg at 03/27/22 1020    clobetasol (TEMOVATE) 0.05 % ointment   Topical BID PRN Linna Hoff, MD        diclofenac sodium (VOLTAREN) 1 % gel 2 g  2 g Topical QID Linna Hoff, MD   2 g at 03/26/22 2108    famotidine (PEPCID) tablet 20 mg  20 mg Oral Nightly Achille Rich, MD   20 mg at 03/26/22 2109    fluticasone propionate (FLONASE) 50 mcg/actuation nasal spray 1 spray  1 spray Topical Daily PRN Muthukkumar, Rashmi, MD        HYDROmorphone (DILAUDID) tablet 2 mg  2 mg Oral Q4H PRN Cleotis Nipper, MD   2 mg at 03/25/22 2024    hydrOXYzine (ATARAX) tablet 50 mg  50 mg Oral Q6H PRN Cleotis Nipper, MD   50 mg at 03/27/22 1026    hyoscyamine (LEVSIN) tablet 125 mcg  125 mcg Oral Q4H PRN Hollie Beach P, DO        lactated Ringers infusion  10 mL/hr Intravenous Continuous Siddiqui, Zia A, MD        melatonin tablet 3 mg  3 mg Oral Nightly PRN Linna Hoff, MD        mirtazapine (REMERON) tablet 7.5 mg  7.5 mg Oral Nightly Alan Ripper, DO   7.5 mg at 03/26/22 2109    naloxone [facility]) injection 0.4 mg  0.4 mg Intravenous Once PRN Linna Hoff, MD        OLANZapine (ZyPREXA) tablet 1.25 mg  1.25 mg Oral Daily PRN Muthukkumar, Rashmi, MD        OLANZapine zydis (ZyPREXA) disintegrating tablet 7.5  mg  7.5 mg Oral Nightly Goldbeck, Lauren D, MD   7.5 mg at 03/26/22 2109    ondansetron (ZOFRAN-ODT) disintegrating tablet 8 mg  8 mg Oral Q12H PRN Hollie Beach P, DO   8 mg at 03/21/22 0913    oxyCODONE (ROXICODONE) immediate release tablet 10 mg  10 mg Oral Q4H PRN Boccaccio, Dominic E, MD   10 mg at 03/27/22 1026    pantoprazole (Protonix) EC tablet 40 mg  40 mg Oral Daily Marda Stalker, MD   40 mg at 03/27/22 1020    phenol (CHLORASEPTIC) 1.4 % spray 2 spray  2 spray Mucous Membrane Q2H PRN Goldbeck, Lauren D, MD        polyethylene glycol (MIRALAX) packet 17 g  17 g Oral Daily Cleotis Nipper, MD   17 g at 03/26/22 0900    pregabalin (LYRICA) capsule 25 mg  25 mg Oral TID Cleotis Nipper, MD   25 mg at 03/27/22 1020    simethicone (MYLICON) chewable tablet 80 mg  80 mg Oral Q6H PRN Achille Rich, MD   80 mg at 03/14/22 1810         Objective:     Vital Signs    Temp:  [36.5 ??C (97.7 ??F)-37 ??C (98.6 ??F)] 36.5 ??C (97.7 ??F)  Pulse:  [84-101] 84  Resp:  [18] 18  BP: (104-125)/(54-67) 104/54  MAP (mmHg):  [68-83] 68  SpO2:  [97 %-98 %] 97 %      Physical Exam    GENERAL:  Well developed, well-nourished female and is in mild distress.   HEAD/NECK:    Reveals normocephalic/atraumatic.   CARDIOVASCULAR:   RRR, no murmur  LUNGS:   Normal work of breathing, no supplemental 02  EXTREMITIES:  Warm, no clubbing, cyanosis, or edema was noted.  NEUROLOGIC:    The patient was alert and oriented times four with normal language, attention, cognition and memory. Cranial nerve exam was grossly normal.    MUSCULOSKELETAL:    Moving all extremities  SKIN:  No obvious rashes lesions or erythema  PSY:  Appropriate affect and mood.    Test Results    Lab Results   Component Value Date    CREATININE 0.53 (L) 03/27/2022     Lab Results   Component Value Date    ALKPHOS 45 (L) 03/27/2022    BILITOT 0.2 (L) 03/27/2022    BILIDIR <0.10 03/17/2022    PROT 5.7 03/27/2022    ALBUMIN 3.0 (L) 03/27/2022    ALT 56 (H) 03/27/2022    AST 33 03/27/2022           Problem List    Principal Problem:    GI bleeding  Active Problems:    [name]    [name]    Pain    Iron deficiency anemia due to chronic blood loss    Severe protein-calorie malnutrition (CMS-HCC)    Lower abdominal pain    Generalized anxiety disorder with panic attacks    MDD (major depressive disorder)    Small intestinal bacterial overgrowth (SIBO)    Ileus (CMS-HCC)

## 2022-03-27 NOTE — Unmapped (Signed)
Patient is alert and oriented x4. Self care. Had a shower at beginning of shift. Mom is at the bedside. Abdominal pain managed with Oxycodone and a heating pad. Right chest central line patent with blood return, dressing intact.  Problem: Adult Inpatient Plan of Care  Goal: Plan of Care Review  03/27/2022 0138 by Ruby Cola, RN BSN  Outcome: Progressing  03/27/2022 0138 by Ruby Cola, RN BSN  Outcome: Progressing  Goal: Patient-Specific Goal (Individualized)  03/27/2022 0138 by Ruby Cola, RN BSN  Outcome: Progressing  03/27/2022 0138 by Ruby Cola, RN BSN  Outcome: Progressing  Goal: Absence of Hospital-Acquired Illness or Injury  03/27/2022 0138 by Ruby Cola, RN BSN  Outcome: Progressing  03/27/2022 0138 by Ruby Cola, RN BSN  Outcome: Progressing  Intervention: Identify and Manage Fall Risk  Recent Flowsheet Documentation  Taken 03/27/2022 0000 by Ruby Cola, RN BSN  Safety Interventions:   environmental modification   low bed   lighting adjusted for tasks/safety  Taken 03/26/2022 2200 by Ruby Cola, RN BSN  Safety Interventions:   fall reduction program maintained   environmental modification   low bed   lighting adjusted for tasks/safety  Taken 03/26/2022 2053 by Ruby Cola, RN BSN  Safety Interventions: environmental modification  Intervention: Prevent Skin Injury  Recent Flowsheet Documentation  Taken 03/26/2022 2200 by Ruby Cola, RN BSN  Positioning for Skin: Supine/Back  Taken 03/26/2022 2053 by Ruby Cola, RN BSN  Positioning for Skin: Standing  Goal: Optimal Comfort and Wellbeing  03/27/2022 0138 by Ruby Cola, RN BSN  Outcome: Progressing  03/27/2022 0138 by Ruby Cola, RN BSN  Outcome: Progressing  Goal: Readiness for Transition of Care  03/27/2022 0138 by Ruby Cola, RN BSN  Outcome: Progressing  03/27/2022 0138 by Ruby Cola, RN BSN  Outcome: Progressing  Goal: Rounds/Family Conference  03/27/2022 0138 by Ruby Cola, RN BSN  Outcome: Progressing  03/27/2022 0138 by Ruby Cola, RN BSN  Outcome: Progressing     Problem: Latex Allergy  Goal: Absence of Allergy Symptoms  03/27/2022 0138 by Ruby Cola, RN BSN  Outcome: Progressing  03/27/2022 0138 by Ruby Cola, RN BSN  Outcome: Progressing     Problem: Gastrointestinal Bleeding  Goal: Optimal Coping with Acute Illness  03/27/2022 0138 by Ruby Cola, RN BSN  Outcome: Progressing  03/27/2022 0138 by Ruby Cola, RN BSN  Outcome: Progressing  Goal: Hemostasis  03/27/2022 0138 by Ruby Cola, RN BSN  Outcome: Progressing  03/27/2022 0138 by Ruby Cola, RN BSN  Outcome: Progressing     Problem: Fall Injury Risk  Goal: Absence of Fall and Fall-Related Injury  03/27/2022 0138 by Ruby Cola, RN BSN  Outcome: Progressing  03/27/2022 0138 by Ruby Cola, RN BSN  Outcome: Progressing  Intervention: Promote Injury-Free Environment  Recent Flowsheet Documentation  Taken 03/27/2022 0000 by Ruby Cola, RN BSN  Safety Interventions:   environmental modification   low bed   lighting adjusted for tasks/safety  Taken 03/26/2022 2200 by Ruby Cola, RN BSN  Safety Interventions:   fall reduction program maintained   environmental modification   low bed   lighting adjusted for tasks/safety  Taken 03/26/2022 2053 by Ruby Cola, RN BSN  Safety Interventions: environmental modification

## 2022-03-27 NOTE — Unmapped (Signed)
Physician Discharge Summary [facility]  [address] [facility]  [address]  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 *    Outpatient Provider Follow Up Issues:   PCP:   [ ]  Please repeat CBC to ensure hemoglobin remains stable and hepatic function panel to monitor LFTs.     Oncology:   [ ]  Notified by inpatient pharmacist of potential interaction between Nirogacestat and Pantoprazole. (Class X avoid combination, Inhibitors of the Proton Pump (PPIs and PCABs) may decrease the serum concentration of Nirogacestat).      Psychiatry:   [ ]  Lexapro was started and discontinued while inpatient due to concern for platelet interaction that could be contributing to her bleeding ulcer. Consider initiation of different SSRI in outpatient setting.     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 GI bleeding resolved however post procedure course c/b pain control.  Please see below for summary of hospital course by problem.    GI Bleed - BRBPR - Acute on chronic anemia   Patient presented with bright red blood per rectum x 7 days and notable hemoglobin drop in outpatient labs from 11 to 7.6. She had recent hospitalization for similar presentation where bleeding was attributed to anal fissures. She underwent pouchoscopy with GI on 12/7 which showed bleeding ulcer at ileoanal anastamosis. Per discussion with Oncology and Gastroenterology, her current bleeding was thought to be related to sorafenib resumption in the setting of her known ulcer. Sorafenib was stopped and the BRBPR slowly resolved over the course of her hospital stay. Her hemoglobin held stably around 8.0 and was 7.9 on day of dishcarge.      Chronic abdominal pain 2/2 desmoid tumors - presumed SIBO - Narcotic bowel syndrome vs Ileus   Presenting with worsening acute on chronic abdominal pain. Having LLQ > RLQ pain (baseline pain typically only in LLQ) Follows with pain mgmt as an outpatient for her chronic abdominal pain.  Etiology is not completely clear.  The differential includes tethering of her bowel secondary to the midline mesenteric desmoid that could be contributed to partial SBO/ileus, however we suspect that this was not the only contributor.  CT abdomen pelvis on admission with stable  findings compared to prior CT on 12/10; similar gaseous distention of the small bowel loops adjacent to the ill-defined infiltrative mesenteric lesion in the inferior abdomen, no obstruction, similar appearance of multiple mesenteric lesions.  Received 2-week course of oral ciprofloxacin for SIBO, per GI. Pain management was a challenge during this hospital stay as pt required daily IV and oral opiate pain medications. Pain Medicine was consulted and provided recommendations to augment her pain control with baclofen, gabapentin, Tylenol, Lidocaine patches, Voltaren, anti-emetics, and psychiatric medications (Zyprexa). Despite this, after weaning off of IV Dilaudid on 1/5, her pain returned and she required IV medications for 10/10 pain. At that time, a KUB re demonstrated gas filled loops of bowel in the lower pelvis c/f ileus vs mild obstruction. CT A/P with oral contrast was stable and most suggestive of ileus at that time. She was made NPO and an NGT was placed for decompression. She had a bowel movement on 1/6 and subsequently began to take more by mouth.  Eventually the NG tube was removed and she began to take more by mouth.  Despite the resolving ileus, pain continued to be a limiting factor and as such pain medicine recommended starting a lidocaine infusion for 3 days.  She tolerated the lidocaine infusion well and experienced some relief of her pain while it infused.  Once it stopped her pain returned and she was subsequently initiated on a Butrans patch while her IV Dilaudid was weaned.  She was then weaned off her IV pain regimen and discharged home on 1/18.     Her home pain regimen on discharge includes:     - Lyrica 25 mg TID               - Continue Butrans patch 10 mcg (Flonase admin to skin prior) q7 days               - Dilaudid 2 mg oral q4 hours PRN  - Liquid Tylenol 1,000 mg q8hr  - Oxy 10mg  q6h PRN  - Baclofen 5mg  TID PRN      Severe Malnutrition 2/2 [name] and abdominal desmoid tumors   On previous admission she was discharged with a Corpak and was ultimately transitioned to TPN.  She is currently receiving TPN every day and this is being followed by Dr. [name], outpatient GI. Received daily TPN while inpatient. She resumed a low residue diet while in the hospital and was tolerating small volume oral intake by day of discharge.      [name]/Desmoid Tumors   Thoughtful note on 03/11/22 from Dr. [name]. Holding sorafenib for the foreseeable future. Looking into starting nirogacestat as no known bleeding risk. Believes that mesenteric lymph nodes are reactive in the setting of TPN, pouchitis/ulcer, malnutrition, and recurrent GIB. Their fertility counsellor is aware of the potential switch to nirogacestat and will be reaching out to talk with pt and her mother. She will follow up closely with Oncology as an outpatient.     Anxiety   Anxiety played a substantial component throughout her hospital stay.  Her presentation included the presence of multiple worries that were difficult to control, sleep disturbances, fatigue, low energy, impaired concentration that was felt to be consistent with generalized anxiety disorder in conjunction with her known major depressive disorder.  Psychiatry was consulted for medication recommendations and starting an SSRI.  Her anxiety was often heightened when thinking about timing of pain medications, making changes to her medication regimen, and when her pain flared.  In  the hospital she was continued on Remeron 7.5 nightly, Zyprexa 7.5 mg nightly gabapentin 200 mg nightly.  While admitted she was initiated on as needed Atarax both of which she tolerated well.  She particularly found benefit from Atarax as needed and will continue that medicine as an outpatient. She was started on Lexapro but this was discontinued due to concern for platelet interaction and bleeding risk.     Chronic Problems  Hx of Pouchitis   Patient has been on cefdinir for this and was recently decreased the dose by her GI doctor for prophylactic dosing. She follows with Dr. [name], GI.  Underwent pouchoscopy on 12/7 with biopsy revealing severely active pouchitis with ulceration. Cefdinir stopped inpatient and started on ciprofloxacin for SIBO.      Hx of PICC Line associated clot   She was treated for this back in January and given recent reinsertion of PICC line for TPN was started on a prophylactic dose of Eliquis. This was held due to concern for bleeding risk with known ileoanal anastomosis site ulcer.        The patient's hospital stay has been complicated by the following clinically significant conditions requiring additional evaluation and treatment or having a significant effect of this patient's care: - Malnutrition POA requiring further investigation, treatment, or monitoring  - Anemia POA requiring further investigation or monitoring     Outpatient Provider Follow Up Issues:   See above.  ______________________________  Discharge Medications:      Your Medication List        STOP taking these medications      cefdinir 300 MG capsule  Commonly known as: OMNICEF     diphenhydrAMINE 50 mg capsule  Commonly known as: BENADRYL     gabapentin 100 MG capsule  Commonly known as: NEURONTIN     hydrocortisone 25 mg suppository  Commonly known as: ANUSOL-HC     lidocaine 2% viscous 2 % Soln  Commonly known as: lidocaine     lidocaine 5 % patch  Commonly known as: LIDODERM     menthol-zinc oxide 0.44-20.6 % Oint  Commonly known as: CALMOSEPTINE     SLEEPY BUTTER     TPN ELECTROLYTES IV            START taking these medications      cholecalciferol (vitamin D3 25 mcg (1,000 units)) 1,000 unit (25 mcg) tablet  Take 1 tablet (25 mcg total) by mouth daily.  Start taking on: March 28, 2022     fluticasone propionate 50 mcg/actuation nasal spray  Commonly known as: FLONASE  1 spray on skin prior to application of butrans patch to reduce irritation and itching     HYDROmorphone 2 MG tablet  Commonly known as: DILAUDID  Take 1 tablet (2 mg total) by mouth every four (4) hours as needed for severe pain for up to 5 days.     hydrOXYzine 50 MG tablet  Commonly known as: ATARAX  Take 1 tablet (50 mg total) by mouth every six (6) hours as needed for anxiety.     pantoprazole 40 MG tablet  Commonly known as: Protonix  Take 1 tablet (40 mg total) by mouth daily.  Start taking on: March 28, 2022     polyethylene glycol 17 gram/dose powder  Commonly known as: GLYCOLAX  Mix 1 capful (17 grams) in 4-8 ounces of water,juice or tea and drink daily     pregabalin 25 MG capsule  Commonly known as: LYRICA  Take 1  capsule (25 mg total) by mouth Three (3) times a day.            CHANGE how you take these medications      baclofen 5 mg Tab tablet  Commonly known as: LIORESAL  Take 1 tablet (5 mg total) by mouth Three (3) times a day as needed for muscle spasms.  What changed:   how much to take  when to take this     oxyCODONE 10 mg immediate release tablet  Commonly known as: ROXICODONE  Take 1 tablet (10 mg total) by mouth every four (4) hours as needed for pain for up to 5 days.  What changed:   how much to take  additional instructions  Another medication with the same name was removed. Continue taking this medication, and follow the directions you see here.            CONTINUE taking these medications      acetaminophen 500 MG tablet  Commonly known as: TYLENOL  Take 2 tablets (1,000 mg total) by mouth every eight (8) hours.     ammonium lactate 12 % lotion  Commonly known as: LAC-HYDRIN  Apply 1 Application topically two (2) times a day.     buprenorphine 10 mcg/hour Ptwk transdermal patch  Place 1 patch on the skin every seven (7) days.  Start taking on: March 29, 2022     clobetasol 0.05 % ointment  Commonly known as: TEMOVATE  Apply topically two (2) times a day. To stubborn/thick rashes on hands/feet ears until clear/smooth. Restart as needed     diclofenac sodium 1 % gel  Commonly known as: VOLTAREN  Apply 2 g topically four (4) times a day.     famotidine 20 MG tablet  Commonly known as: PEPCID  Take 1 tablet (20 mg total) by mouth nightly.     hyoscyamine 0.125 mg tablet  Commonly known as: LEVSIN  Take 1 tablet (0.125 mg total) by mouth every four (4) hours as needed for cramping.     mirtazapine 7.5 MG tablet  Commonly known as: REMERON  Take 1 tablet (7.5 mg total) by mouth nightly.     naloxone 4 mg/actuation nasal spray  Commonly known as: NARCAN  One spray in either nostril once for known/suspected opioid overdose. May repeat every 2-3 minutes in alternating nostril til EMS arrives     OLANZapine 7.5 MG tablet  Commonly known as: ZyPREXA  TAKE 1 TABLET BY MOUTH EVERY NIGHTLY     ondansetron 8 MG disintegrating tablet  Commonly known as: ZOFRAN-ODT  Take 1 tablet (8 mg total) by mouth every twelve (12) hours as needed for nausea.     pimecrolimus 1 % cream  Commonly known as: ELIDEL  Apply topically two (2) times a day. To face as needed for rash     silver sulfADIAZINE 1 % cream  Commonly known as: SILVADENE  Apply topically daily. To burn on leg     triamcinolone 0.1 % cream  Commonly known as: KENALOG  Apply topically two (2) times a day.     zinc oxide 10 % Crea  Apply 1 application. topically daily as needed.              Allergies:  Adhesive tape-silicones; Ferrlecit [sodium ferric gluconat-sucrose]; Levofloxacin; Methylnaltrexone; Neomycin; Papaya; Morphine; Zosyn [piperacillin-tazobactam]; Compazine [prochlorperazine]; and Latex, natural rubber  ______________________________________________________________________  Pending Test Results:      Most Recent Labs:  All lab results last 24  hours -   Recent Results (from the past 24 hour(s))   CBC    Collection Time: 03/27/22  5:24 AM   Result Value Ref Range    WBC 5.0 3.6 - 11.2 10*9/L    RBC 3.04 (L) 3.95 - 5.13 10*12/L    HGB 7.9 (L) 11.3 - 14.9 g/dL    HCT 16.1 (L) 09.6 - 44.0 %    MCV 80.0 77.6 - 95.7 fL    MCH 25.8 (L) 25.9 - 32.4 pg    MCHC 32.3 32.0 - 36.0 g/dL    RDW 04.5 40.9 - 81.1 %    MPV 9.1 6.8 - 10.7 fL    Platelet 190 150 - 450 10*9/L   Magnesium Level    Collection Time: 03/27/22  5:24 AM   Result Value Ref Range    Magnesium 2.1 1.6 - 2.6 mg/dL   Phosphorus Level    Collection Time: 03/27/22  5:24 AM   Result Value Ref Range    Phosphorus 4.7 2.4 - 5.1 mg/dL   Comprehensive Metabolic Panel    Collection Time: 03/27/22  5:24 AM   Result Value Ref Range    Sodium 140 135 - 145 mmol/L    Potassium 3.5 3.4 - 4.8 mmol/L    Chloride 108 (H) 98 - 107 mmol/L    CO2 28.0 20.0 - 31.0 mmol/L    Anion Gap 4 (L) 5 - 14 mmol/L    BUN 13 9 - 23 mg/dL    Creatinine 9.14 (L) 0.55 - 1.02 mg/dL    BUN/Creatinine Ratio 25     eGFR CKD-EPI (2021) Female >90 >=60 mL/min/1.52m2    Glucose 88 70 - 179 mg/dL    Calcium 8.5 (L) 8.7 - 10.4 mg/dL    Albumin 3.0 (L) 3.4 - 5.0 g/dL    Total Protein 5.7 5.7 - 8.2 g/dL    Total Bilirubin 0.2 (L) 0.3 - 1.2 mg/dL    AST 33 <=78 U/L    ALT 56 (H) 10 - 49 U/L    Alkaline Phosphatase 45 (L) 46 - 116 U/L       Relevant Studies/Radiology:  ECG 12 Lead    Result Date: 03/18/2022  SINUS TACHYCARDIA OTHERWISE NORMAL ECG WHEN COMPARED WITH ECG OF 17-Mar-2022 13:42, NO SIGNIFICANT CHANGE WAS FOUND Confirmed by Warnell Forester ([id number]) on 03/18/2022 7:14:33 PM    ECG 12 Lead    Result Date: 03/17/2022  NORMAL SINUS RHYTHM NORMAL ECG WHEN COMPARED WITH ECG OF 14-Mar-2022 15:18, NO SIGNIFICANT CHANGE WAS FOUND Confirmed by Rose-jones, Lisa ([id number]) on 03/17/2022 5:51:13 PM    XR Abdomen 1 View    Result Date: 03/16/2022  EXAM: XR ABDOMEN 1 VIEW ACCESSION: [id number] UN CLINICAL INDICATION: 22 years old with NGT (CATHETER VASCULAR FIT & ADJ)  TECHNIQUE: Semierect view of the abdomen was obtained (1 image) COMPARISON: 03/15/2022 FINDINGS: Esophagogastric tube is noted with side hole and tip located in the stomach. No dilated loops of small or large bowel are visualized. Lung bases are clear.     Esophagogastric tube tip and side hole within the stomach.    XR Abdomen 1 View    Result Date: 03/15/2022  EXAM: XR ABDOMEN 1 VIEW ACCESSION: [id number] UN CLINICAL INDICATION: 22 years old with NGT (CATHETER VASCULAR FIT & ADJ)  TECHNIQUE: Semierect view of the abdomen was obtained (1 image) COMPARISON: 03/14/2022 FINDINGS: Esophagogastric tube is noted with side hole and tip located in the stomach. Bowel gas is noted in stomach and prominent  loops of small bowel measuring up to 3.3 cm in diameter. This is consistent with recent CT.     Esophagogastric tube tip and side hole within the stomach.    ECG 12 Lead    Result Date: 03/15/2022  NORMAL SINUS RHYTHM NORMAL ECG WHEN COMPARED WITH ECG OF 10-Mar-2022 08:41, NO SIGNIFICANT CHANGE WAS FOUND Confirmed by Christella Noa ([id number]) on 03/15/2022 11:48:59 AM    CT Abdomen Pelvis W Contrast    Result Date: 03/15/2022  EXAM: CT ABDOMEN PELVIS W CONTRAST ACCESSION: [id number] UN CLINICAL INDICATION: 22 years old with Concern for bowel obstruction  -  please use oral contrast  COMPARISON: 03/10/2022 CT abdomen and pelvis TECHNIQUE: A helical CT scan of the abdomen and pelvis was obtained following IV contrast from the lung bases through the pubic symphysis. Images were reconstructed in the axial plane. Coronal and sagittal reformatted images were also provided for further evaluation. FINDINGS: LOWER CHEST: Unremarkable. LIVER: Normal liver contour. Scattered subcentimeter hypoattenuating lesions are too small to characterize by CT. BILIARY: The gallbladder is normal in appearance. No biliary ductal dilatation.  SPLEEN: Normal in size and contour. PANCREAS: Normal pancreatic contour.  No focal lesions.  No ductal dilation. ADRENAL GLANDS: Normal appearance of the adrenal glands. KIDNEYS/URETERS: Symmetric renal enhancement.  No hydronephrosis.  No solid renal mass. Unchanged 1.1 cm left interpolar cyst. BLADDER: Unremarkable. REPRODUCTIVE ORGANS: Anteverted unremarkable uterus. No suspicious adnexal mass. GI TRACT: Total colectomy with ileoanal J-pouch formation. Central loops of small bowel remain tethered by ill-defined mesenteric lesion (2:84). There is persistent air distended and mildly dilated distal small bowel, without discrete transition point. Intraluminal contrast extends from the stomach to the ileoanal pouch. No evidence of extraluminal contrast. PERITONEUM, RETROPERITONEUM AND MESENTERY: No free air.  No ascites.  No fluid collection. Infiltrative and ill-defined mesenteric lesions are similar in size and distribution. Please see 03/10/2022 CT for further characterization. LYMPH NODES: Prominent mesenteric lymph nodes, unchanged. VESSELS: Hepatic and portal veins are patent.  Normal caliber aorta.  BONES and SOFT TISSUES: No aggressive osseous lesions. Nodular enhancement within the right rectus musculature, unchanged.     Stable findings compared to 03/10/2022 CT abdomen and pelvis. Similar gaseous distention of distal small bowel loops adjacent to the infiltrative mesenteric lesion. Intraluminal contrast extends to the level of the ileoanal pouch compatible with ileus.     XR Abdomen 1 View    Result Date: 03/14/2022  EXAM: XR ABDOMEN 1 VIEW DATE: 03/14/2022 3:16 PM ACCESSION: [id number] UN DICTATED: 03/14/2022 3:18 PM INTERPRETATION LOCATION: MAIN CAMPUS CLINICAL INDICATION: 22 years old Female with ABDOMINAL PAIN   -  RIGHT LOWER QUADRANT  COMPARISON: CT abdomen and pelvis 03/10/2022. Abdominal radiograph 02/16/2022. TECHNIQUE: Supine views of the abdomen were obtained.  FINDINGS: Suture material projects over the low central pelvis. There are multiple mildly prominent  loops of gas-containing bowel within the low pelvis, similar to prior CT. No evidence of free air however evaluation is limited on supine radiographs. Osseous structures are unremarkable. Visualized lung bases are clear.     Similar clustered, gas-containing loops of bowel within the low pelvis, nonspecific however may represent ileus versus early/mild obstruction.     ECG 12 Lead    Result Date: 03/10/2022  NORMAL SINUS RHYTHM NORMAL ECG WHEN COMPARED WITH ECG OF 13-Jan-2022 08:36, NO SIGNIFICANT CHANGE WAS FOUND Confirmed by Joneen Roach ([id number]) on 03/10/2022 7:49:05 PM    CT Abdomen Pelvis W IV Contrast Only    Result Date: 03/10/2022  EXAM: CT ABDOMEN PELVIS  W CONTRAST ACCESSION: [id number] UN CLINICAL INDICATION: 22 years old with significant abdominal pain  COMPARISON: CT abdomen pelvis 02/16/2022 TECHNIQUE: A helical CT scan of the abdomen and pelvis was obtained following IV contrast from the lung bases through the pubic symphysis. Images were reconstructed in the axial plane. Coronal and sagittal reformatted images were also provided for further evaluation. FINDINGS: LOWER CHEST: Unremarkable. LIVER: Normal liver contour. Stable subcentimeter hypoenhancing lesions, too small to characterize on CT. BILIARY: Unremarkable gallbladder. No biliary ductal dilatation.  SPLEEN: Normal in size and contour. PANCREAS: Normal pancreatic contour.  No focal lesions.  No ductal dilation. ADRENAL GLANDS: Normal appearance of the adrenal glands. KIDNEYS/URETERS: Symmetric renal enhancement.  No hydronephrosis.  No solid renal mass. Unchanged 1.1 cm left interpolar cyst. BLADDER: Unremarkable. REPRODUCTIVE ORGANS: Uterus is present. GI TRACT: Status post total colectomy with ileoanal J-pouch formation. Moderate gas distention of distal small bowel loops reaching up to 4.3 cm in diameter, unchanged from prior. Multiple bowel loops are tethered around the ill-defined and infiltrative mesenteric lesion in the mid abdomen (2:83). No abrupt caliber change of the bowel. Appendix surgically absent. PERITONEUM, RETROPERITONEUM AND MESENTERY: No free air.  No ascites.  No fluid collection. Multiple infiltrative and ill-defined mesenteric lesions are similar to prior. Largest lesions, for reference: - Left lower quadrant measuring 4.3 x 4.0 cm (2:71), previously 4.1 x 3.8 cm. - Mid abdomen measuring 4.4 x 3.6 cm (2:83), previously 4.7 x 3.4 cm. LYMPH NODES: Numerous prominent mesenteric lymph nodes are similar to prior. VESSELS: Hepatic and portal veins are patent.  Normal caliber aorta.  BONES and SOFT TISSUES: No aggressive osseous lesions.  No focal soft tissue lesions. Similar appearance of multiple areas of nodular enhancement within the right rectus musculature (2:61, 2:76).     Stable findings compared to 02/16/2022: -Similar gaseous distention of small bowel loops adjacent to the ill-defined infiltrative mesenteric lesion in the inferior abdomen. No high-grade bowel obstruction. -Similar appearance of multiple mesenteric lesions.      Appointments which have been scheduled for you      Mar 31, 2022 10:30 AM  (Arrive by 10:15 AM)  RETURN VIDEO HCP DIRECT LINK with Modena Nunnery, MD  [facility] GI MEDICINE [place] ([place])  Arrive at: This is a Video Visit [address]  [facility] [address]  [place] [address]  [phone]   A direct link will be sent to you by your provider at the time of your video appointment. Please do NOT go to the clinic.     For your video visit, you will need a computer with a working camera, speaker and microphone, a smartphone, or a tablet with internet access.         Apr 01, 2022 11:00 AM  (Arrive by 10:30 AM)  RETURN VIDEO MYCHART with Galen Daft, FNP  [facility] PAIN MANAGEMENT CENTER [address] [place] [facility]) [address]  [address]  [place] [address]  [phone]   Please sign into My [place] Chart at least 15 minutes before your appointment to complete the eCheck-In process. You must complete eCheck-In before you can start your video visit. We also recommend testing your audio and video connection to troubleshoot any issues before your visit begins. Click ???Join Video Visit??? to complete these checks. Once you have completed eCheck-In and tested your audio and video, click ???Join Call??? to connect to your visit.     For your video visit,  you will need a computer with a working camera, speaker and microphone, a smartphone, or a tablet with internet access.    My [facility] Chart enables you to manage your health, send non-urgent messages to your provider, view your test results, schedule and manage appointments, and request prescription refills securely and conveniently from your computer or mobile device.    You can go to [email or web address] to sign in to your My [place] Chart account with your username and password. If you have forgotten your username or password, please choose the ???Forgot Username???? and/or ???Forgot Password???? links to gain access. You also can access your My [place] Chart account with the free MyChart mobile app for Android or iPhone.    If you need assistance accessing your My [place] Chart account or for assistance in reaching your provider's office to reschedule or cancel your appointment, please call [facility] [phone].         Apr 03, 2022  9:30 AM  (Arrive by 9:15 AM)  RETURN PEDS with Gala Romney, MD  [facility]) [address]  [place] [address]  [phone]        Apr 10, 2022  3:30 PM  (Arrive by 3:00 PM)  RETURN VIDEO MYCHART with [name], MD  [facility]) [address]  [place] [address]  [phone]   Please sign into My [place] Chart at least 15 minutes before your appointment to complete the eCheck-In process. You must complete eCheck-In before you can start your video visit. We also recommend testing your audio and video connection to troubleshoot any issues before your visit begins. Click ???Join Video Visit??? to complete these checks. Once you have completed eCheck-In and tested your audio and video, click ???Join Call??? to connect to your visit.     For your video visit, you will need a computer with a working camera, speaker and microphone, a smartphone, or a tablet with internet access.    My [place] Chart enables you to manage your health, send non-urgent messages to your provider, view your test results, schedule and manage appointments, and request prescription refills securely and conveniently from your computer or mobile device.    You can go to [email or web address] to sign in to your My [place] Chart account with your username and password. If you have forgotten your username or password, please choose the ???Forgot Username???? and/or ???Forgot Password???? links to gain access. You also can access your My [place] Chart account with the free MyChart mobile app for Android or iPhone.    If you need assistance accessing your My [place] Chart account or for assistance in reaching your provider's office to reschedule or cancel your appointment, please call [facility] [phone].         Jul 14, 2022 10:45 AM  (Arrive by 10:30 AM)  RETURN HEM BENIGN with Cassiopeia Anne Jorge Ny, [place]  [facility] [place] [facility]) [address]  [facility] [address]  [place] [address]  [phone]             ______________________________________________________________________  Discharge Day Services:  BP 104/54  - Pulse 84  - Temp 36.5 ??C (97.7 ??F) (Tympanic)  - Resp 18  - Ht 165.1 cm (5' 5)  - Wt 62 kg (136  lb 11.2 oz)  - LMP 02/13/2022  - SpO2 97%  - BMI 22.75 kg/m??     Pt seen on the day of discharge and determined appropriate for discharge.    Condition at Discharge: fair    Length of Discharge: I spent greater than 30 mins in the discharge of this patient.    Theda Sers, MD  PGY-1, [facility]

## 2022-03-28 ENCOUNTER — Telehealth: Payer: Self-pay

## 2022-03-28 DIAGNOSIS — Z932 Ileostomy status: Secondary | ICD-10-CM | POA: Diagnosis not present

## 2022-03-28 DIAGNOSIS — R1084 Generalized abdominal pain: Secondary | ICD-10-CM | POA: Diagnosis not present

## 2022-03-28 DIAGNOSIS — E860 Dehydration: Secondary | ICD-10-CM | POA: Diagnosis not present

## 2022-03-28 DIAGNOSIS — R112 Nausea with vomiting, unspecified: Secondary | ICD-10-CM | POA: Diagnosis not present

## 2022-03-28 MED ORDER — PANTOPRAZOLE 40 MG TABLET,DELAYED RELEASE
ORAL_TABLET | Freq: Every day | ORAL | 0 refills | 30.00000 days | Status: CP
Start: 2022-03-28 — End: 2022-04-27
  Filled 2022-03-27: qty 30, 30d supply, fill #0

## 2022-03-28 MED ORDER — CHOLECALCIFEROL (VITAMIN D3) 25 MCG (1,000 UNIT) TABLET
ORAL_TABLET | Freq: Every day | ORAL | 3 refills | 100.00000 days | Status: CP
Start: 2022-03-28 — End: 2023-03-28
  Filled 2022-03-27: qty 100, 100d supply, fill #0

## 2022-03-28 NOTE — Unmapped (Signed)
Pt A&Ox4, VSS, pain control maintained w/ PRN meds. Central line flushed & dressing intact. Family at bedside.  Bed in low position & locked with table within reach. Pt discharged to home & education provided. Meds brought to bedside from pharmacy.       Problem: Adult Inpatient Plan of Care  Goal: Plan of Care Review  Outcome: Resolved  Goal: Patient-Specific Goal (Individualized)  Outcome: Resolved  Goal: Absence of Hospital-Acquired Illness or Injury  Outcome: Resolved  Intervention: Identify and Manage Fall Risk  Recent Flowsheet Documentation  Taken 03/27/2022 1200 by Willette Pa, RN  Safety Interventions:   environmental modification   fall reduction program maintained   lighting adjusted for tasks/safety   low bed   nonskid shoes/slippers when out of bed  Goal: Optimal Comfort and Wellbeing  Outcome: Resolved  Goal: Readiness for Transition of Care  Outcome: Resolved  Goal: Rounds/Family Conference  Outcome: Resolved     Problem: Latex Allergy  Goal: Absence of Allergy Symptoms  Outcome: Resolved     Problem: Gastrointestinal Bleeding  Goal: Optimal Coping with Acute Illness  Outcome: Resolved  Goal: Hemostasis  Outcome: Resolved     Problem: Pain Acute  Goal: Optimal Pain Control and Function  Outcome: Resolved     Problem: Fall Injury Risk  Goal: Absence of Fall and Fall-Related Injury  Outcome: Resolved  Intervention: Promote Injury-Free Environment  Recent Flowsheet Documentation  Taken 03/27/2022 1200 by Willette Pa, RN  Safety Interventions:   environmental modification   fall reduction program maintained   lighting adjusted for tasks/safety   low bed   nonskid shoes/slippers when out of bed

## 2022-03-28 NOTE — Unmapped (Signed)
Adolescent and Young Adult Cancer Program Visit     Service date: March 25, 2022    Encounter location: Inpatient - Adult    Clinician: [name], LCSW - AYA Clinical Social Worker    Patient identifiers: [name] [name] is a 22 y.o. with desmoid tumors and [name]. [name] [name] uses she/her pronouns.       Additional visit participants: pt's mother    Visit Summary     Met with the patient for evaluation of needs that may be addressed by [facility].    [name] [name] appeared somewhat brighter and calmer today. She notes that hydroxizine has been very helpful for periods of increased anxiety, and was contributing to current stabilization of mood. While mood is better today, she plans to send an email to school regarding withdrawing from the semester. Encouraged her to identify other enjoyable activities she can do today; she is looking forward to PT.    Reviewed events of past week. She reiterated her goal of tapering of opioids so she can regain ability to drive and have a lower medication burden. She shared desire to be home, but nervousness about being home without rapid access to professional care if she experiences acute worsening of pain or other symptoms.    She shared that she and her parents have decided to start the process of getting a service dog for anxiety/PTSD. She is very excited about this and is able to identify multiple ways this may help her. We discussed these, and I suggested additional possible benefits. Her mom joined later in our visit and provided additional details.    Follow Up/Plan:     I will continue to follow this patient for AYA-appropriate support. They have my contact information and have been encouraged to contact me as needed.     Flowsheet     AYA Assessment  Primary Caregiver: Parent  Location Seen: Inpatient  Contact Point: During treatment  Referral Source: AYA team  Issues Discussed: Mental Health  AYA Team Interventions: Supportive counseling, Goals of care support and communication  Referrals Made: Psychiatry  Follow Up: As needed  Time Spent (in minutes): 25    03/27/2022     [name], LCSW  Adolescent and Young Adult Clinical Social Worker  Phone: [phone]  Email: [email or web address]

## 2022-03-28 NOTE — Unmapped (Signed)
Adolescent and Young Adult Cancer Program Visit     Service date: March 19, 2022    Encounter location: Inpatient - Adult    Clinician: [name], LCSW - AYA Clinical Social Worker    Patient identifiers: [name] [name] is a 22 y.o. with desmoid tumors and [name]. [name] [name] uses she/her pronouns.       Visit Summary     Met with the patient for evaluation of needs that may be addressed by [facility].    [name] [name] was tearful initially during visit due to pain crisis. Provided supportive presence and exploration of needs. [name] [name] endorsed distress about continued pain and health challenges in addition to the pain itself; she repeated statements from previous visits indicating her desire to return to a normal life and her sense that she has surpassed her ability to endure these challenges. Provided supportive counseling and exploration of remaining areas of enjoyment in her life.    Spoke with her mother outside of room; provided support and psychoeducation on symptoms of depression. Encouraged her to increase [name] [name]'s sense of independence when possible, and encouraged patience with [name] [name]'s difficulty in engaging in her typical coping strategies during this period of significant depression and anxiety. Shared my hope that continued adjustments to medication regimen will begin to provide Robynn some relief, which may make it easier for her to re-engage in coping strategies and reconnect with her internal resources and strengths.    Follow Up/Plan:     Will discuss with Neftali's outpatient oncologist and psychiatry team to explore additional strategies for supporting her mental health, which in turn may help support her physical health.    I will continue to follow this patient for AYA-appropriate support. They have my contact information and have been encouraged to contact me as needed.     Flowsheet     AYA Assessment  Primary Caregiver: Parent  Location Seen: Inpatient  Contact Point: During treatment  Referral Source: AYA team  Issues Discussed: Mental Health, Healthcare system barriers  AYA Team Interventions: Supportive counseling, Goals of care support and communication, Coordination with primary oncology team, Care coordination, Psychoeducation, Adapting care to AYA needs  Referrals Made: Psychiatry  Follow Up: As needed  Time Spent (in minutes): 50    03/27/2022     [name], LCSW  Adolescent and Young Adult Clinical Social Worker  Phone: [phone]  Email: [email or web address]

## 2022-03-28 NOTE — Patient Outreach (Signed)
  Care Coordination TOC Note Transition Care Management Unsuccessful Follow-up Telephone Call  Date of discharge and from where:  03/27/22-[facility]    Attempts:  1st Attempt  Reason for unsuccessful TCM follow-up call:  Left voice message   Hetty Blend [place] Management Telephonic Care Management Coordinator Direct Phone: [phone] Toll Free: [phone] Fax: [phone]

## 2022-03-29 DIAGNOSIS — E860 Dehydration: Secondary | ICD-10-CM | POA: Diagnosis not present

## 2022-03-29 DIAGNOSIS — Z932 Ileostomy status: Secondary | ICD-10-CM | POA: Diagnosis not present

## 2022-03-29 DIAGNOSIS — R1084 Generalized abdominal pain: Secondary | ICD-10-CM | POA: Diagnosis not present

## 2022-03-29 DIAGNOSIS — R112 Nausea with vomiting, unspecified: Secondary | ICD-10-CM | POA: Diagnosis not present

## 2022-03-29 MED ORDER — BUPRENORPHINE 10 MCG/HOUR WEEKLY TRANSDERMAL PATCH
MEDICATED_PATCH | TRANSDERMAL | 0 refills | 28.00000 days | Status: CP
Start: 2022-03-29 — End: 2022-03-27

## 2022-03-29 NOTE — Unmapped (Signed)
GASTROENTEROLOGY  CONSULT NOTE - INFLAMMATORY BOWEL DISEASE  03/31/2022    Demographics:  [name] [name] is a 22 y.o. year old female    Referring physician:   Jarold Motto, PAC  [address]  [place],  [place] [address]    Patient Care Team:  Jarold Motto, [facility] as PCP - General  Debbe Mounts, MD as Consulting Physician (Peds: Pediatric Hematology-Oncology)  Cherlynn Perches, MD as Consulting Physician (Orthopedic Surgery)  Jobe Gibbon, MD as Consulting Physician (Interventional Radiology)  Twana First, MD as Consulting Physician  Tatton-Howard, Clelia Croft, RN as Registered Nurse (Oncology Navigator)  Lenise Arena Judye Bos, MD as Attending Provider (Surgical Oncology)  Clarene Duke, MD as Consulting Physician (Plastic Surgery)  Helyn Numbers, MD as Consulting Physician (Gastroenterology)  Childers, Biagio Quint, MD as Consulting Physician (Palliative Medicine)  Belva Bertin, RN as Registered Nurse (Palliative Medicine)  Lajuana Matte, RN as Registered Nurse (Oncology Navigator)  Sheral Apley, MD (Medical Oncology)  Lajuana Matte, RN as Registered Nurse (Oncology Navigator)  [name], MD as Fellow (Psychiatry)           HPI / NOTE :     Today, I saw [name] [name] for follow-up consultation in the [facility] at the request of Dr. Elenore Rota regarding pouch problems and rectal bleeding.  Outside records including available clinical notes, endoscopy reports, imaging results and pathology results were reviewed in detail as part of this follow-up consultation.       Chief complaint: Follow-up    HPI:  Currently on TPN, but able to eat some meals now with less abdominal pain. Bowel frequency 4-5/day. Still sometimes blood in bowel movements.     Pain therapy: on Lyrica. Buprenorphine  patch 10 mcg/hour,  baclofen, oxycodone 10 mg 3-4 times daily, dilaudid as needed (not needed in the moment)managed in pain clinic. Lyrica.     Anxiety/nausea : olanzapine (zyprexa) 7.5 mg , mirtazepin 7.5 mg, atarax 1-2 times day    Desmoid therapy: Off sorafenib, which most likely lead to the bleeding tin the pouch    Nutrition: Weight up by 20 pounds since start of TPN in 12/2021    Pouch: Off antibiotics, feels bloated , increased abdominal pain mainly in right lower quadrant      Review of Systems: positive for [name]  Otherwise, the balance of 10 systems is negative.    Bowel frequency /24 hours 4-6  Bowel frequency during night 0-1  Blood in bowel movements none  Abdominal pain  Severe = 3  Bloating frequent  Incontinence none  Bodyweight increasing  Loperamide No  Lomotil No             Past Medical History:   Past medical history:   Past Medical History:   Diagnosis Date    Abdominal pain     Acid reflux     occas    Anesthesia complication     itching, shaking, coldness; last few surgeries have gone much better    Cancer (CMS-HCC)     Cataract of right eye     COVID-19 virus infection 01/2019    Cyst of thyroid determined by ultrasound     monitoring    [name]     2 right forearm, 1 left thigh, 1 right scapula, 1 under left clavicle; multiple    Difficult intravenous access     [name] (familial adenomatous polyposis)  [name]     Gastric polyps     History of chemotherapy     last treatment approx 05/2019    History of colon polyps     History of COVID-19 01/2019    Iron deficiency anemia due to chronic blood loss     received iron infusion 11-2019    PONV (postoperative nausea and vomiting)     Rectal bleeding     Syncopal episodes     especially if becoming dehydrated     Past surgical history:   Past Surgical History:   Procedure Laterality Date    cyroablation      cystis removal      desmoid removal      PR CLOSE ENTEROSTOMY,RESEC+ANAST N/A 10/09/2020    Procedure: ILEOSTOMY TAKEDOWN;  Surgeon: Mickle Asper, MD;  Location: OR [place];  Service: General Surgery    PR COLONOSCOPY W/BIOPSY SINGLE/MULTIPLE N/A 10/27/2012    Procedure: COLONOSCOPY, FLEXIBLE, PROXIMAL TO SPLENIC FLEXURE; WITH BIOPSY, SINGLE OR MULTIPLE;  Surgeon: Shirlyn Goltz Mir, MD;  Location: PEDS PROCEDURE ROOM [place];  Service: Gastroenterology    PR COLONOSCOPY W/BIOPSY SINGLE/MULTIPLE N/A 09/14/2013    Procedure: COLONOSCOPY, FLEXIBLE, PROXIMAL TO SPLENIC FLEXURE; WITH BIOPSY, SINGLE OR MULTIPLE;  Surgeon: Shirlyn Goltz Mir, MD;  Location: PEDS PROCEDURE ROOM [place];  Service: Gastroenterology    PR COLONOSCOPY W/BIOPSY SINGLE/MULTIPLE N/A 11/08/2014    Procedure: COLONOSCOPY, FLEXIBLE, PROXIMAL TO SPLENIC FLEXURE; WITH BIOPSY, SINGLE OR MULTIPLE;  Surgeon: Arnold Long Mir, MD;  Location: PEDS PROCEDURE ROOM [facility];  Service: Gastroenterology    PR COLONOSCOPY W/BIOPSY SINGLE/MULTIPLE N/A 12/26/2015    Procedure: COLONOSCOPY, FLEXIBLE, PROXIMAL TO SPLENIC FLEXURE; WITH BIOPSY, SINGLE OR MULTIPLE;  Surgeon: Arnold Long Mir, MD;  Location: PEDS PROCEDURE ROOM [facility];  Service: Gastroenterology    PR COLONOSCOPY W/BIOPSY SINGLE/MULTIPLE N/A 09/02/2017    Procedure: COLONOSCOPY, FLEXIBLE, PROXIMAL TO SPLENIC FLEXURE; WITH BIOPSY, SINGLE OR MULTIPLE;  Surgeon: Arnold Long Mir, MD;  Location: PEDS PROCEDURE ROOM ;  Service: Gastroenterology    PR COLSC FLX W/REMOVAL LESION BY HOT BX FORCEPS N/A 08/27/2016    Procedure: COLONOSCOPY, FLEXIBLE, PROXIMAL TO SPLENIC FLEXURE; W/REMOVAL TUMOR/POLYP/OTHER LESION, HOT BX FORCEP/CAUTE;  Surgeon: Arnold Long Mir, MD;  Location: PEDS PROCEDURE ROOM [facility];  Service: Gastroenterology    PR COLSC FLX W/RMVL OF TUMOR POLYP LESION SNARE TQ N/A 02/25/2019    Procedure: COLONOSCOPY FLEX; W/REMOV TUMOR/LES BY SNARE;  Surgeon: Helyn Numbers, MD;  Location: [facility] PROCEDURES [place] [facility];  Service: Gastroenterology    PR COLSC FLX W/RMVL OF TUMOR POLYP LESION SNARE TQ N/A 03/13/2020    Procedure: COLONOSCOPY FLEX; W/REMOV TUMOR/LES BY SNARE;  Surgeon: Helyn Numbers, MD;  Location: [facility] [place] [facility]; Service: Gastroenterology    PR EXC SKIN BENIG 2.1-3 CM TRUNK,ARM,LEG Right 02/25/2017    Procedure: EXCISION, BENIGN LESION INCLUDE MARGINS, EXCEPT SKIN TAG, LEGS; EXCISED DIAMETER 2.1 TO 3.0 CM;  Surgeon: Clarene Duke, MD;  Location: CHILDRENS OR [facility];  Service: Plastics    PR EXC SKIN BENIG 3.1-4 CM TRUNK,ARM,LEG Right 02/25/2017    Procedure: EXCISION, BENIGN LESION INCLUDE MARGINS, EXCEPT SKIN TAG, ARMS; EXCISED DIAMETER 3.1 TO 4.0 CM;  Surgeon: Clarene Duke, MD;  Location: CHILDRENS OR [facility];  Service: Plastics    PR EXC SKIN BENIG >4 CM FACE,FACIAL Right 02/25/2017    Procedure: EXCISION, OTHER BENIGN LES INCLUD MARGIN, FACE/EARS/EYELIDS/NOSE/LIPS/MUCOUS MEMBRANE; EXCISED DIAM >4.0 CM;  Surgeon: Clarene Duke, MD;  Location: CHILDRENS OR [facility];  Service:  Plastics    PR EXC TUMOR SOFT TISSUE LEG/ANKLE SUBQ 3+CM Right 08/05/2019    Procedure: EXCISION, TUMOR, SOFT TISSUE OF LEG OR ANKLE AREA, SUBCUTANEOUS; 3 CM OR GREATER;  Surgeon: Arsenio Katz, MD;  Location: MAIN OR ;  Service: Plastics    PR EXC TUMOR SOFT TISSUE LEG/ANKLE SUBQ <3CM Right 08/05/2019    Procedure: EXCISION, TUMOR, SOFT TISSUE OF LEG OR ANKLE AREA, SUBCUTANEOUS; LESS THAN 3 CM;  Surgeon: Arsenio Katz, MD;  Location: MAIN OR [facility];  Service: Plastics    PR LAP, SURG PROCTECTOMY W J-POUCH N/A 08/10/2020    Procedure: ROBOTIC ASSISTED LAPAROSCOPIC PROCTOCOLECTOMY, ILEAL J POUCH, WITH OSTOMY;  Surgeon: Mickle Asper, MD;  Location: OR [place];  Service: General Surgery    PR NDSC EVAL INTSTINAL POUCH DX W/COLLJ SPEC SPX N/A 01/23/2021    Procedure: ENDO EVAL SM INTEST POUCH; DX;  Surgeon: Modena Nunnery, MD;  Location: [facility] PROCED[facility] [facility];  Service: Gastroenterology    PR NDSC EVAL INTSTINAL POUCH DX W/COLLJ SPEC SPX N/A 08/27/2021    Procedure: ENDO EVAL SM INTEST POUCH; DX;  Surgeon: Hunt Oris, MD;  [facility];  Service: Gastroenterology    PR NDSC EVAL INTSTINAL POUCH DX W/COLLJ SPEC SPX N/A 12/09/2021    Procedure: ENDO EVAL SM INTEST POUCH; DX;  Surgeon: Vidal Schwalbe, MD;  [facility]: [facility];  Service: Gastroenterology    PR NDSC EVAL INTSTINAL POUCH W/BX SINGLE/MULTIPLE N/A 01/20/2022    Procedure: ENDOSCOPIC EVAL OF SMALL INTESTINAL POUCH; DIAGNOSTIC, No biopsies;  Surgeon: Andrey Farmer, MD;  Location: GI PROC[facility]URES [facility];  Service: Gastroenterology    PR NDSC EVAL INTSTINAL POUCH W/BX SINGLE/MULTIPLE N/A 02/13/2022    Procedure: ENDOSCOPIC EVAL OF SMALL INTESTINAL POUCH; DIAGNOSTIC, WITH BIOPSY;  Surgeon: Bronson Curb, MD;  Location: GI PROCEDURES [facility];  Service: Gastroenterology    PR UNLISTED PROCEDURE SMALL INTESTINE  01/23/2021    Procedure: UNLISTED PROCEDURE, SMALL INTESTINE;  Surgeon: Modena Nunnery, MD;  Location: [facility] PROC[facility] [place] [facility];  Service: Gastroenterology    PR UNLISTED PROCEDURE SMALL INTESTINE  02/13/2022    Procedure: UNLISTED PROCEDURE, SMALL INTESTINE;  Surgeon: Bronson Curb, MD;  Location: GI PROC[facility]URES [facility];  Service: Gastroenterology    PR UPPER GI ENDOSCOPY,BIOPSY N/A 10/27/2012    Procedure: UGI ENDOSCOPY; WITH BIOPSY, SINGLE OR MULTIPLE;  Surgeon: Shirlyn Goltz Mir, MD;  Location: PEDS PROCEDURE ROOM [facility];  Service: Gastroenterology    PR UPPER GI ENDOSCOPY,BIOPSY N/A 09/14/2013    Procedure: UGI ENDOSCOPY; WITH BIOPSY, SINGLE OR MULTIPLE;  Surgeon: Shirlyn Goltz Mir, MD;  Location: PEDS PROCEDURE ROOM [facility];  Service: Gastroenterology    PR UPPER GI ENDOSCOPY,BIOPSY N/A 11/08/2014    Procedure: UGI ENDOSCOPY; WITH BIOPSY, SINGLE OR MULTIPLE;  Surgeon: Arnold Long Mir, MD;  Location: PEDS PROCEDURE ROOM [facility];  Service: Gastroenterology    PR UPPER GI ENDOSCOPY,BIOPSY N/A 12/26/2015    Procedure: UGI ENDOSCOPY; WITH BIOPSY, SINGLE OR MULTIPLE;  Surgeon: Arnold Long Mir, MD;  Location: PEDS PROCEDURE ROOM [facility];  Service: Gastroenterology    PR UPPER GI ENDOSCOPY,BIOPSY N/A 08/27/2016    Procedure: UGI ENDOSCOPY; WITH BIOPSY, SINGLE OR MULTIPLE;  Surgeon: Arnold Long Mir, MD;  Location: PEDS PROCEDURE ROOM [facility];  Service: Gastroenterology    PR UPPER GI ENDOSCOPY,BIOPSY N/A 09/02/2017    Procedure: UGI ENDOSCOPY; WITH BIOPSY, SINGLE OR MULTIPLE;  Surgeon: Arnold Long Mir, MD;  Location: PEDS PROCEDURE ROOM [facility];  Service: Gastroenterology    PR UPPER GI ENDOSCOPY,BIOPSY N/A 03/13/2020    Procedure: UGI ENDOSCOPY; WITH BIOPSY, SINGLE OR MULTIPLE;  Surgeon: Helyn Numbers, MD;  Location: [facility] PROCEDURES [facility] [facility];  Service: Gastroenterology    PR UPPER GI ENDOSCOPY,BIOPSY N/A 09/05/2021    Procedure: UGI ENDOSCOPY; WITH BIOPSY, SINGLE OR MULTIPLE;  Surgeon: Wendall Papa, MD;  Location: GI PROC[facility]URES [facility];  Service: Gastroenterology    PR UPPER GI ENDOSCOPY,DIAGNOSIS N/A 01/20/2022    Procedure: UGI ENDO, INCLUDE ESOPHAGUS, STOMACH, & DUODENUM &/OR JEJUNUM; DX W/WO COLLECTION SPECIMN, BY BRUSH OR WASH;  Surgeon: Andrey Farmer, MD;  Location: GI PROCEDURES [facility];  Service: Gastroenterology    TUMOR REMOVAL      multiple-head, neck, back, hand, right flank, multiple     Family history:   Family History   Problem Relation Age of Onset    No Known Problems Mother     No Known Problems Father     No Known Problems Sister     No Known Problems Brother     Stroke Maternal Grandmother     Other Maternal Grandmother         benign lesions of liver and pancreas, further details unknown    Cancer Maternal Grandmother     Diabetes Maternal Grandmother     Hypertension Maternal Grandmother     Thyroid disease Maternal Grandmother     Arthritis Maternal Grandfather     Asthma Maternal Grandfather     COPD Paternal Grandmother         Deceased    Miscarriages / Stillbirths Paternal Grandmother     Alcohol abuse Paternal Grandfather         Deceased    No Known Problems Maternal Aunt     No Known Problems Maternal Uncle     No Known Problems Paternal Aunt     No Known Problems Paternal Uncle     Anesthesia problems Neg Hx     Broken bones Neg Hx     Cancer Neg Hx     Clotting disorder Neg Hx     Collagen disease Neg Hx     Diabetes Neg Hx     Dislocations Neg Hx     Fibromyalgia Neg Hx     Gout Neg Hx     Hemophilia Neg Hx     Osteoporosis Neg Hx     Rheumatologic disease Neg Hx     Scoliosis Neg Hx     Severe sprains Neg Hx     Sickle cell anemia Neg Hx     Spinal Compression Fracture Neg Hx     Melanoma Neg Hx     Basal cell carcinoma Neg Hx     Squamous cell carcinoma Neg Hx      Social history:   Social History     Socioeconomic History    Marital status: Single     Spouse name: None    Number of children: None    Years of education: None    Highest education level: None   Tobacco Use    Smoking status: Never     Passive exposure: Past    Smokeless tobacco: Never   Vaping Use    Vaping Use: Never used   Substance and Sexual Activity    Alcohol use: Never    Drug use: Never    Sexual activity: Never   Other Topics Concern    Do you use sunscreen? Yes  Tanning bed use? No    Are you easily burned? No    Excessive sun exposure? No    Blistering sunburns? 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     Social Determinants of Health     Financial Resource Strain: Low Risk  (03/14/2022)    Overall Financial Resource Strain (CARDIA)     Difficulty of Paying Living Expenses: Not very hard   Food Insecurity: No Food Insecurity (03/14/2022)    Hunger Vital Sign     Worried About Running Out of Food in the Last Year: Never true     Ran Out of Food in the Last Year: Never true   Transportation Needs: No Transportation Needs (03/14/2022)    PRAPARE - Therapist, art (Medical): No     Lack of Transportation (Non-Medical): No             Allergies:     Allergies   Allergen Reactions    Adhesive Tape-Silicones Hives and Rash     Paper tape  And tegederm ok Ferrlecit [Sodium Ferric Gluconat-Sucrose] Swelling and Rash    Levofloxacin Swelling and Rash     Swelling in mouth, rash,     Methylnaltrexone      Per Patient: I lost bowel control, severe abdominal cramping, and elevated BP    Neomycin Swelling     Rxn after ear drops; ear swelling    Papaya Hives    Morphine Nausea And Vomiting    Zosyn [Piperacillin-Tazobactam] Itching and Rash     Red and itchy    Compazine [Prochlorperazine] Other (See Comments)     Extreme agitation    Latex, Natural Rubber Rash             Medications:     Current Outpatient Medications   Medication Sig Dispense Refill    ANTACID-ANTIGAS 200-200-20 mg/5 mL Susp       acetaminophen (TYLENOL) 500 MG tablet Take 2 tablets (1,000 mg total) by mouth every eight (8) hours. 30 tablet 0    ammonium lactate (LAC-HYDRIN) 12 % lotion Apply 1 Application topically two (2) times a day.      baclofen (LIORESAL) 5 mg Tab tablet Take 1 tablet (5 mg total) by mouth Three (3) times a day as needed for muscle spasms. 30 tablet 0    buprenorphine 10 mcg/hour PTWK transdermal patch Place 1 patch on the skin and change every seven (7) days. 4 patch 0    cholecalciferol, vitamin D3 25 mcg, 1,000 units,, 1,000 unit (25 mcg) tablet Take 1 tablet (25 mcg total) by mouth daily. 100 tablet 3    clobetasoL (TEMOVATE) 0.05 % ointment Apply topically two (2) times a day. To stubborn/thick rashes on hands/feet ears until clear/smooth. Restart as needed 60 g 3    diclofenac sodium (VOLTAREN) 1 % gel Apply 2 g topically four (4) times a day. 100 g 0    famotidine (PEPCID) 20 MG tablet Take 1 tablet (20 mg total) by mouth nightly. 90 tablet 3    fluticasone propionate (FLONASE) 50 mcg/actuation nasal spray 1 spray on skin prior to application of butrans patch to reduce irritation and itching 16 g 0    HYDROmorphone (DILAUDID) 2 MG tablet Take 1 tablet (2 mg total) by mouth every four (4) hours as needed for severe pain for up to  5 days. 10 tablet 0    hydrOXYzine (ATARAX) 50 MG tablet Take 1 tablet (50 mg total) by mouth every six (6) hours as needed for anxiety. 30 tablet 0    hyoscyamine (LEVSIN) 0.125 mg tablet Take 1 tablet (0.125 mg total) by mouth every four (4) hours as needed for cramping.      mirtazapine (REMERON) 7.5 MG tablet Take 1 tablet (7.5 mg total) by mouth nightly. 90 tablet 3    naloxone (NARCAN) 4 mg nasal spray One spray in either nostril once for known/suspected opioid overdose. May repeat every 2-3 minutes in alternating nostril til EMS arrives (Patient not taking: Reported on 03/31/2022) 2 each 0    OLANZapine (ZYPREXA) 7.5 MG tablet TAKE 1 TABLET BY MOUTH EVERY NIGHTLY 90 tablet 1    ondansetron (ZOFRAN-ODT) 8 MG disintegrating tablet Take 1 tablet (8 mg total) by mouth every twelve (12) hours as needed for nausea.      oxyCODONE (ROXICODONE) 10 mg immediate release tablet Take 1 tablet (10 mg total) by mouth every four (4) hours as needed for pain for up to 5 days. 10 tablet 0    pantoprazole (PROTONIX) 40 MG tablet Take 1 tablet (40 mg total) by mouth daily. 30 tablet 0    pimecrolimus (ELIDEL) 1 % cream Apply topically two (2) times a day. To face as needed for rash 30 g 11    polyethylene glycol (GLYCOLAX) 17 gram/dose powder Mix 1 capful (17 grams) in 4-8 ounces of water,juice or tea and drink daily 510 g 0    pregabalin (LYRICA) 25 MG capsule Take 1 capsule (25 mg total) by mouth Three (3) times a day. 90 capsule 0    triamcinolone (KENALOG) 0.1 % cream Apply topically two (2) times a day. 28 g 3    zinc oxide 10 % Crea Apply 1 application. topically daily as needed.       No current facility-administered medications for this visit.             Physical Exam:   BP 126/67  - Pulse 96  - Temp 36.5 ??C (97.7 ??F)  - Ht 165.1 cm (5' 5)  - Wt 61.2 kg (135 lb)  - LMP 02/13/2022  - BMI 22.47 kg/m??       BP Readings from Last 3 Encounters:   03/31/22 126/67   03/27/22 104/54   02/28/22 127/65      Wt Readings from Last 12 Encounters:   03/31/22 61.2 kg (135 lb) 03/26/22 62 kg (136 lb 11.2 oz)   02/28/22 60.3 kg (133 lb)   02/26/22 59.5 kg (131 lb 1.6 oz)   02/19/22 58.9 kg (129 lb 14.4 oz)   02/06/22 55.8 kg (123 lb)   01/29/22 57.4 kg (126 lb 9.6 oz)   01/28/22 53.8 kg (118 lb 8 oz)   01/20/22 51.3 kg (113 lb)   01/02/22 52.3 kg (115 lb 6.4 oz)   12/31/21 52.2 kg (115 lb 1.6 oz)   12/26/21 52.2 kg (115 lb)      BMI: Estimated body mass index is 22.47 kg/m?? as calculated from the following:    Height as of this encounter: 165.1 cm (5' 5).    Weight as of this encounter: 61.2 kg (135 lb).    BSA: Estimated body surface area is 1.68 meters squared as calculated from the following:    Height as of this encounter: 165.1 cm (5' 5).    Weight as of this encounter: 61.2  kg (135 lb).  GEN: no apparent distress, appears comfortable on exam  HEENT: mucous membranes moist, facial plethora; negative Pemberton's Sign   NEURO:  gait normal, non-focal, no obvious neurologic abnormality  NECK: Supple, no lymphadenopathy  LUNGS: CTAB, no wheezes, rales, or rhonchi  CV: S1/S2, RRR, no murmurs  ABD: Soft, palpable bulging in LLQ, mildly tender  Extremities: no cyanosis, clubbing or edema, normal gait  Psych: affect appropriate, A&O x3  SKIN: no visible lesions on face, neck, arms, abdomen  PERIANAL / RECTAL EXAM:   Deferred           Labs, Data & Indices:     Lab Review:   Office Visit on 03/31/2022   Component Date Value Ref Range Status    Sodium 03/31/2022 142  135 - 145 mmol/L Final    Potassium 03/31/2022 4.3  3.4 - 4.8 mmol/L Final    Chloride 03/31/2022 110 (H)  98 - 107 mmol/L Final    CO2 03/31/2022 24.9  20.0 - 31.0 mmol/L Final    Anion Gap 03/31/2022 7  5 - 14 mmol/L Final    BUN 03/31/2022 12  9 - 23 mg/dL Final    Creatinine 16/12/9602 0.43 (L)  0.55 - 1.02 mg/dL Final    BUN/Creatinine Ratio 03/31/2022 28   Final    eGFR CKD-EPI (2021) Female 03/31/2022 >90  >=60 mL/min/1.48m2 Final    Glucose 03/31/2022 105  70 - 179 mg/dL Final    Calcium 54/11/8117 9.6  8.7 - 10.4 mg/dL Final    Albumin 14/78/2956 3.5  3.4 - 5.0 g/dL Final    Total Protein 03/31/2022 6.5  5.7 - 8.2 g/dL Final    Total Bilirubin 03/31/2022 <0.2 (L)  0.3 - 1.2 mg/dL Final    AST 21/30/8657 21  <=34 U/L Final    ALT 03/31/2022 25  10 - 49 U/L Final    Alkaline Phosphatase 03/31/2022 47  46 - 116 U/L Final    Ferritin 03/31/2022 11.8  7.3 - 270.7 ng/mL Final    Iron 03/31/2022 14 (L)  50 - 170 ug/dL Final    TIBC 84/69/6295 330  250 - 425 ug/dL Final    Iron Saturation (%) 03/31/2022 4 (L)  20 - 55 % Final    WBC 03/31/2022 4.1  3.6 - 11.2 10*9/L Final    RBC 03/31/2022 3.46 (L)  3.95 - 5.13 10*12/L Final    HGB 03/31/2022 9.1 (L)  11.3 - 14.9 g/dL Final    HCT 28/41/3244 27.8 (L)  34.0 - 44.0 % Final    MCV 03/31/2022 80.3  77.6 - 95.7 fL Final    MCH 03/31/2022 26.4  25.9 - 32.4 pg Final    MCHC 03/31/2022 32.8  32.0 - 36.0 g/dL Final    RDW 03/12/7251 14.7  12.2 - 15.2 % Final    MPV 03/31/2022 9.2  6.8 - 10.7 fL Final    Platelet 03/31/2022 228  150 - 450 10*9/L Final    Neutrophils % 03/31/2022 51.9  % Final    Lymphocytes % 03/31/2022 26.4  % Final    Monocytes % 03/31/2022 17.2  % Final    Eosinophils % 03/31/2022 3.8  % Final    Basophils % 03/31/2022 0.7  % Final    Absolute Neutrophils 03/31/2022 2.1  1.8 - 7.8 10*9/L Final    Absolute Lymphocytes 03/31/2022 1.1  1.1 - 3.6 10*9/L Final    Absolute Monocytes 03/31/2022 0.7  0.3 - 0.8 10*9/L Final  Absolute Eosinophils 03/31/2022 0.2  0.0 - 0.5 10*9/L Final    Absolute Basophils 03/31/2022 0.0  0.0 - 0.1 10*9/L Final   No results displayed because visit has over 200 results.        .............................................................................................................................................             HISTORY:     Brief Disease Course:  08/2020 colectomy for [name] 8/022 ileostomy takedown, complicated by postop ileus and inabailitry to eat during stage 1 and 2 (need for TPN)    Other Paraspinal and anterior chest desmoid tumors    Colectomy specimen:  Terminal ileum, appendix and colon, total colectomy:  -Tubular adenomas (at least 100), extending from the ileocecal valve to the rectum, with the majority located in the rectum. The adenomas range from 0.2 cm to 0.7 cm in greatest dimension.  -There is no evidence of high-grade dysplasia or malignancy.  -Mucosal margins are negative for dysplasia (5.2 cm from proximal, 0.1 cm from distal).  -The appendix and ileum are not involved.      Endoscopy:     Pouchoscopy 02/2022     Patient is status-post total colectomy with an ileal pouch-anal        anastomosis.       Anal fissures were found on anoscopy       The ileocolonic anastomosis contained a single (solitary)        circumferential ulcer, approximately 10mm in length. Oozing was present.        Biopsies were taken with a cold forceps for histology. Coagulation for        bleeding prevention using argon plasma at 0.5 liters/minute and 25 watts        was successful.       The exam was otherwise without abnormality.       A: Colon, pouch anastomosis, biopsy:  -Severely active pouchitis with ulceration, mild glandular architecture distortion and focal adenomatous change.    Pouchoscopy 01/2022:  Anal fissure found on perianal exam.                         - Stool in the ileoanal pouch and neo-terminal ileum.                         - The ileoanal pouch and neo-terminal ileum are normal.                         - A single (solitary) ulcer at the ileocolonic                          anastomosis.    Upper endoscopy 08/2021     Normal esophagus.                         - Multiple gastric polyps. Biopsied.                         - Normal stomach otherwise. Biopsied.                         - Normal examined duodenum. Biopsied.    A: Duodenum, biopsy  - Adenomatous polyp (1 fragment)  - No high grade dysplasia or carcinoma identified  - Separate fragments of duodenal mucosa with mild-moderate villous blunting  -  No increased intraepithelial lymphocytes identified     B: Stomach, biopsy  - Gastric fundic mucosa with chronic superficial gastritis  - Gastric antral mucosa with erosive minimally active chronic superficial gastritis and slight reactive foveolar hyperplasia  - No Helicobacter pylori identified on H&E stain  - No intestinal metaplasia or dysplasia identified     C: Stomach, polypectomy  - Polypoid fragments of gastric fundic mucosa with features suggestive of early fundic gland polyps  - No intestinal metaplasia or dysplasia identified      Pouchoscopy 08/2021   The perianal and digital rectal examinations were normal.       The j-pouch, pouch inlet and rectal cuff appeared normal.       The exam was otherwise without abnormality, including no evidence of        mass of intussusception.                                                               Pouchoscopy 01/23/2021  Impression:            - Preparation of the colon was fair.                         - The examined portion of the ileum was normal.                         - The ileoanal pouch is normal.                         - Rectal cuff with congestion and an intact staple                          line seen.                         - No specimens collected.    - Hegar dilation 17 - 20 mm    Last upper and lower endoscopy 03/2020  Upper    - Normal esophagus.                         - Multiple gastric polyps. Fundic gland polyps                         - Normal mucosa was found in the antrum. Biopsied.                         - Normal examined duodenum    Colon  - Six 6 to 10 mm polyps in the rectum, in the sigmoid                          colon, in the descending colon and in the transverse                          colon, removed with a cold snare. Resected and  retrieved.                         - 300 polyps in the entire colon.    A: Stomach, biopsy  - Gastric fundic mucosa with chronic superficial gastritis  - Gastric antral mucosa with chronic superficial gastritis and reactive foveolar hyperplasia  - No Helicobacter pylori identified on H&E stain  - No intestinal metaplasia or dysplasia identified     B: Colon, transverse, biopsy  - Adenomatous polyp (multiple fragments)  - No high grade dysplasia or carcinoma identified    Imaging:     US Liver doppler 04/23/2021   IMPRESSION:   1.Patent hepatic vasculature with normal flow direction.   2.Mildly echogenic liver, which is nonspecific, but may be seen in fibrofatty changes.       Soluble contrast enema 09/19/2020     Mild narrowing at the ileal pouch-anal anastomosis which likely represents mild post-operative edema. Small linear focus of contrast at the level of the anastomosis is favored to be intraluminal over a tiny contained leak given that it spontaneously resolves.     CT abdomen and pelvis August 26, 2020     1. Unremarkable appearance to the small bowel. J pouch is unremarkable. Diverting loop ileostomy is unremarkable.      CT abdomen and pelvis August 18, 2020     IMPRESSION:  Dilated loops of ileum distal to the loop ileostomy with associated wall thickening.  Findings are suggestive of nonspecific ileitis, infectious or inflammatory.  No drainable fluid collections.  No evidence of obstruction    CT abdomen and pelvis w contrast 02/15/2021    IMPRESSION:   Prior colectomy. Mid to distal small bowel loops are prominent with   air-fluid levels. Difficult to completely exclude distal partial   small bowel obstruction.     Small amount of free fluid in the abdomen and pelvis.       CT abdomen and pelvis with contrast 02/22/2021  IMPRESSION:   There is no evidence of intestinal obstruction or pneumoperitoneum.   There is no hydronephrosis.     Previous subtotal colectomy. Dilation of distal ileum seen in the   previous examination could not be identified in the current study.   Oral contrast has reached the rectum.     Possible small hepatic cyst. There is 10 mm smooth marginated   low-density lesion in the left kidney with density measurements   higher than usual for simple cyst. Follow-up renal sonogram in the   outpatient setting should be considered to re-evaluate this finding.               Assessment & Recommendations:     [name] [name] is a 22 y.o. female who presents for follow up for GI issues after pouch done for [name].     Pouchitis symptoms, abdominal distension: Currently normal bowel frequency however increased abdominal distention which is probably due to use of narcotics and bowel dysmotility.  We will restart cefdinir.  Regarding the findings of adenoma on biopsy I discussed with the family that this is not a major concern since adenomatous changes based on her [name] history to be expected.  There is no need to repeat a pouchoscopy in 3 months.  I recommend to repeat a pouchoscopy in 12 months meaning end of 2024.    Abdominal pain: Multifactorial, probably different by anxiety, narcotic bowel syndrome and other factors.  I recommended strongly to try to reduce  the oxycodone weekly to 3 tabs a day then 2 tablets a day, 1 tablet a day and then try to only stay on the buprenorphine patch.  Currently she is also not using Dilaudid and I encouraged her not to use it.    Anxiety/nausea : Nausea improved.  On olanzapine (zyprexa) 7.5 mg and mirtazepin 7.5 mg .  She is also on twice daily Atarax which helps her anxiety.  Follows with psychiatry.    Desmoid therapy:Off  soratinib, which may have caused the pouch bleeding.  Start a new therapy nirogacestat (Ogsiveo???) planned, which just was FDA approved.     Nutrition: On TPN improved, she gained 20 pounds since the start and complains of fluid overload.  No peripheral edema.  Minimal swollen fingers.  The distended abdomen is not due to fluid overload but due to dilated bowel loops in the setting of bowel dysmotility.  We will now try to reduce the TPN to every second day or overall 3 days a week and see if she tolerates oral intake better.    B12/Folate (normal in 11/30/203) (600 B12, Folate 19)    Fecal incontinence: Currently no complaint.    Iron deficiency anemia: Low ferritin in December.  We will recheck today.  Still significant anemia.    Iron deficit:  at least 1500 mg, given the chronic occult bleeding in past , 2000 mg INFED would be optinal to achieve a more long lasting effect.           The iron deficit was calculated according total INFeD Requirement for Hemoglobin Restoration and Iron Stores Replacement in Patients with Iron Deficiency Anemia  [Dose (mL) = 0.0442 (Desired Hb - Observed Hb) x LBW + (0.26 x LBW)]. According to the anemia care pathway of the Crohn's and Colitis Foundation  (Inflamm Bowel Dis 2017 Vol. 23 Issue 1 Pages 35-43) and European and US Guidelines  for treatment of iron deficiency in IBD patient (J Crohns Colitis 2015 Vol. 9 Issue 3 Pages 211-22, Lillard Anes Care 2021 Vol. 27 Issue 11 Suppl Pages S211-s218)  the aim for restoring iron  stores in IBD with iv iron application is ferritin > 100 mcg/L .       Portions of this record have been created using Scientist, clinical (histocompatibility and immunogenetics). Dictation errors have been sought, but may not have been identified and corrected.     Decision-making today was high complexity on the basis of discussion of [name]/desmoid tumors/pouchitis, chronic abdominal pain, and reviewing imaging/colonoscopy with future next steps.    PLAN:      Patient Instructions   TPN: Try to reduce to every second day.  Iron: we will check iron stores.   Antibiotic: Start cefdinir as 1 tablet twice  daily for 10 days, the try to reduce to once daily  Keep me updated

## 2022-03-30 DIAGNOSIS — Z932 Ileostomy status: Secondary | ICD-10-CM | POA: Diagnosis not present

## 2022-03-30 DIAGNOSIS — R1084 Generalized abdominal pain: Secondary | ICD-10-CM | POA: Diagnosis not present

## 2022-03-30 DIAGNOSIS — E860 Dehydration: Secondary | ICD-10-CM | POA: Diagnosis not present

## 2022-03-30 DIAGNOSIS — R112 Nausea with vomiting, unspecified: Secondary | ICD-10-CM | POA: Diagnosis not present

## 2022-03-31 ENCOUNTER — Telehealth: Payer: Self-pay

## 2022-03-31 ENCOUNTER — Ambulatory Visit
Admit: 2022-03-31 | Discharge: 2022-04-01 | Payer: PRIVATE HEALTH INSURANCE | Attending: Gastroenterology | Primary: Gastroenterology

## 2022-03-31 DIAGNOSIS — G8929 Other chronic pain: Principal | ICD-10-CM

## 2022-03-31 DIAGNOSIS — Z789 Other specified health status: Principal | ICD-10-CM

## 2022-03-31 DIAGNOSIS — F419 Anxiety disorder, unspecified: Principal | ICD-10-CM

## 2022-03-31 DIAGNOSIS — R197 Diarrhea, unspecified: Principal | ICD-10-CM

## 2022-03-31 DIAGNOSIS — D485 Neoplasm of uncertain behavior of skin: Principal | ICD-10-CM

## 2022-03-31 DIAGNOSIS — R109 Unspecified abdominal pain: Principal | ICD-10-CM

## 2022-03-31 DIAGNOSIS — D1391 FAP (familial adenomatous polyposis): Principal | ICD-10-CM

## 2022-03-31 DIAGNOSIS — E611 Iron deficiency: Principal | ICD-10-CM

## 2022-03-31 DIAGNOSIS — K91850 Pouchitis: Principal | ICD-10-CM

## 2022-03-31 DIAGNOSIS — D509 Iron deficiency anemia, unspecified: Principal | ICD-10-CM

## 2022-03-31 DIAGNOSIS — R110 Nausea: Principal | ICD-10-CM

## 2022-03-31 DIAGNOSIS — E860 Dehydration: Secondary | ICD-10-CM | POA: Diagnosis not present

## 2022-03-31 DIAGNOSIS — R112 Nausea with vomiting, unspecified: Secondary | ICD-10-CM | POA: Diagnosis not present

## 2022-03-31 DIAGNOSIS — Z932 Ileostomy status: Secondary | ICD-10-CM | POA: Diagnosis not present

## 2022-03-31 DIAGNOSIS — R1084 Generalized abdominal pain: Secondary | ICD-10-CM | POA: Diagnosis not present

## 2022-03-31 LAB — COMPREHENSIVE METABOLIC PANEL
ALBUMIN: 3.5 g/dL (ref 3.4–5.0)
ALKALINE PHOSPHATASE: 47 U/L (ref 46–116)
ALT (SGPT): 25 U/L (ref 10–49)
ANION GAP: 7 mmol/L (ref 5–14)
AST (SGOT): 21 U/L (ref <=34)
BILIRUBIN TOTAL: <0.2 mg/dL — ABNORMAL LOW (ref 0.3–1.2)
BLOOD UREA NITROGEN: 12 mg/dL (ref 9–23)
BUN / CREAT RATIO: 28
CALCIUM: 9.6 mg/dL (ref 8.7–10.4)
CHLORIDE: 110 mmol/L — ABNORMAL HIGH (ref 98–107)
CO2: 24.9 mmol/L (ref 20.0–31.0)
CREATININE: 0.43 mg/dL — ABNORMAL LOW
EGFR CKD-EPI (2021) FEMALE: >90 mL/min/{1.73_m2} (ref >=60)
GLUCOSE RANDOM: 105 mg/dL (ref 70–179)
POTASSIUM: 4.3 mmol/L (ref 3.4–4.8)
PROTEIN TOTAL: 6.5 g/dL (ref 5.7–8.2)
SODIUM: 142 mmol/L (ref 135–145)

## 2022-03-31 LAB — IRON PANEL
IRON SATURATION: 4 % — ABNORMAL LOW (ref 20–55)
IRON: 14 ug/dL — ABNORMAL LOW
TOTAL IRON BINDING CAPACITY: 330 ug/dL (ref 250–425)

## 2022-03-31 LAB — CBC W/ AUTO DIFF
BASOPHILS ABSOLUTE COUNT: 0 10*9/L (ref 0.0–0.1)
BASOPHILS RELATIVE PERCENT: 0.7 %
EOSINOPHILS ABSOLUTE COUNT: 0.2 10*9/L (ref 0.0–0.5)
EOSINOPHILS RELATIVE PERCENT: 3.8 %
HEMATOCRIT: 27.8 % — ABNORMAL LOW (ref 34.0–44.0)
HEMOGLOBIN: 9.1 g/dL — ABNORMAL LOW (ref 11.3–14.9)
LYMPHOCYTES ABSOLUTE COUNT: 1.1 10*9/L (ref 1.1–3.6)
LYMPHOCYTES RELATIVE PERCENT: 26.4 %
MEAN CORPUSCULAR HEMOGLOBIN CONC: 32.8 g/dL (ref 32.0–36.0)
MEAN CORPUSCULAR HEMOGLOBIN: 26.4 pg (ref 25.9–32.4)
MEAN CORPUSCULAR VOLUME: 80.3 fL (ref 77.6–95.7)
MEAN PLATELET VOLUME: 9.2 fL (ref 6.8–10.7)
MONOCYTES ABSOLUTE COUNT: 0.7 10*9/L (ref 0.3–0.8)
MONOCYTES RELATIVE PERCENT: 17.2 %
NEUTROPHILS ABSOLUTE COUNT: 2.1 10*9/L (ref 1.8–7.8)
NEUTROPHILS RELATIVE PERCENT: 51.9 %
PLATELET COUNT: 228 10*9/L (ref 150–450)
RED BLOOD CELL COUNT: 3.46 10*12/L — ABNORMAL LOW (ref 3.95–5.13)
RED CELL DISTRIBUTION WIDTH: 14.7 % (ref 12.2–15.2)
WBC ADJUSTED: 4.1 10*9/L (ref 3.6–11.2)

## 2022-03-31 LAB — FERRITIN: FERRITIN: 11.8 ng/mL

## 2022-03-31 NOTE — Unmapped (Addendum)
TPN: Try to reduce to every second day.  Iron: we will check iron stores.   Antibiotic: Start cefdinir as 1 tablet twice  daily for 10 days, the try to reduce to once daily  Keep me updated

## 2022-03-31 NOTE — Unmapped (Signed)
Per Dr. [name], need to change TPN infusions from 5days per week to every other day (3-4days per week). Called [facility] and gave message to pharmacist, he needed me to speak to dietician, Marchelle Folks, order faxed.  Plan confirmed.

## 2022-03-31 NOTE — Patient Outreach (Signed)
  Care Coordination TOC Note Transition Care Management Unsuccessful Follow-up Telephone Call  Date of discharge and from where:  03/31/22-[facility]  Attempts:  2nd Attempt  Reason for unsuccessful TCM follow-up call:  No answer/busy    Enzo Montgomery, RN,BSN,CCM [employer] Telephonic Care Management Coordinator Direct Phone: [phone] Toll Free: [phone] Fax: [phone]

## 2022-03-31 NOTE — Patient Outreach (Signed)
  Care Coordination TOC Note Transition Care Management Unsuccessful Follow-up Telephone Call  Date of discharge and from where:  03/27/22-[facility]  Attempts:  3rd Attempt  Reason for unsuccessful TCM follow-up call:  Unable to reach patient    [facility] Telephonic Care Management Coordinator Direct Phone: [phone] Toll Free: [phone] Fax: [phone]

## 2022-04-01 ENCOUNTER — Telehealth: Admit: 2022-04-01 | Discharge: 2022-04-02 | Payer: PRIVATE HEALTH INSURANCE

## 2022-04-01 DIAGNOSIS — M7918 Myalgia, other site: Principal | ICD-10-CM

## 2022-04-01 DIAGNOSIS — G894 Chronic pain syndrome: Principal | ICD-10-CM

## 2022-04-01 DIAGNOSIS — R1084 Generalized abdominal pain: Principal | ICD-10-CM

## 2022-04-01 DIAGNOSIS — G893 Neoplasm related pain (acute) (chronic): Principal | ICD-10-CM

## 2022-04-01 DIAGNOSIS — D485 Neoplasm of uncertain behavior of skin: Principal | ICD-10-CM

## 2022-04-01 DIAGNOSIS — R112 Nausea with vomiting, unspecified: Secondary | ICD-10-CM | POA: Diagnosis not present

## 2022-04-01 DIAGNOSIS — Z932 Ileostomy status: Secondary | ICD-10-CM | POA: Diagnosis not present

## 2022-04-01 DIAGNOSIS — E860 Dehydration: Secondary | ICD-10-CM | POA: Diagnosis not present

## 2022-04-01 MED ORDER — PREGABALIN 25 MG CAPSULE
ORAL_CAPSULE | Freq: Three times a day (TID) | ORAL | 0 refills | 30 days | Status: CP
Start: 2022-04-01 — End: 2022-05-01

## 2022-04-01 MED ORDER — BUPRENORPHINE 10 MCG/HOUR WEEKLY TRANSDERMAL PATCH
MEDICATED_PATCH | TRANSDERMAL | 0 refills | 28 days | Status: CP
Start: 2022-04-01 — End: ?

## 2022-04-01 MED ORDER — BACLOFEN 5 MG TABLET
ORAL_TABLET | Freq: Three times a day (TID) | ORAL | 0 refills | 10 days | Status: CP | PRN
Start: 2022-04-01 — End: 2022-05-01

## 2022-04-01 MED ORDER — OXYCODONE 10 MG TABLET
ORAL_TABLET | Freq: Four times a day (QID) | ORAL | 0 refills | 30 days | Status: CP | PRN
Start: 2022-04-01 — End: ?

## 2022-04-01 NOTE — Unmapped (Deleted)
Chronic Pain Follow Up Note    I spent *** minutes on the {phone audio video visit:67489} with the patient. I spent an additional *** minutes on pre- and post-visit activities.   The patient consented to this consult.    The patient was physically located in [place] or a state in which I am permitted to provide care. The patient understood that s/he may incur co-pays and cost sharing, and agreed to the telemedicine visit. The visit was completed via phone and/or video, which was appropriate and reasonable under the circumstances given the patient's presentation at the time.    The patient has been advised of the potential risks and limitations of this mode of treatment (including, but not limited to, the absence of in-person examination) and has agreed to be treated using telemedicine. The patient's/patient's family's questions regarding telemedicine have been answered. No vitals or physical exam was performed but the previous exam was copied forward in this note for continuity.     If the phone/video visit was completed in an ambulatory setting, the patient has also been advised to contact their provider???s office for worsening conditions, and seek emergency medical treatment and/or call 911 if the patient deems either necessary.    Visit modifiers:   {ajlmodifiervirtual:66646::POS 02, 95 and CR (virtual visit by phone)}    -Location of patient during visit: Home in [place]  -Provider location: [facility]   -Names of all people present during visit: *** Sarita Scioli, Galen Daft, NP, and [employer] (Scribe)       Assessment and Plan    No diagnosis found.    [name] [name] is a 22 y.o. female with past medical history significant for DVT (RUE, s/p Eliquis x3 months), [name] syndrome s/p colectomy, desmoid tumor, anemia, and severe protein-calorie malnutrition who is being followed at [facility] for abdominal and right shoulder pain that is consistent with the diagnosis of cancer associated pain.    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 Pt has continued and now chronic pain related to her tumors, has R shoulder pain as well. Follows with [facility] Dr [name] as well as by [facility] oncology palliative care (Dr [name]) for pall care sx and pain mng.    Chronic abdominal pain  ***  Chronic, not at goal. Urine tox screen and opioid agreement current and appropriate. Reviewed labs from 01/29/22, CMP shows normal Creatinine at 0.36, normal AST at 16, and normal ALT at 13. Today, patient reports that she is overall worse due to recent hospitalization. She is currently taking oxycodone 10 mg q4h prn in conjunction with oxycodone 5 mg TID-QID prn from hospital discharge. We had long discussion about weaning oxycodone safely and starting Butrans patch when she was ready and pain was adequately under control. Discussed continuing to wean oxycodone as tolerated and once she is stable at 2-3 tabs/day we could start Butrans 10 mcg/hr patch. Patient will reach out to our clinic if she needs any medication adjustments prior to next visit. She is hopeful to return to driving, though we encourage she take her time in controlling her pain before making switch to Butrans abruptly.   - Continue Lyrica 25 mg TID  - Continue Baclofen 5mg  TID prn; refilled    - Continue Butrans patch at 10 mcg/hr after able to wean patient from current oxy regimen, informed patient to wean to #  2-3 tabs of oxycodone per day for 2 days prior to initiation of Butrans and then wean completely off oxycodone over the subsequent days  - Continue Oxycodone 10 mg Q4H prn following hospitalization with PO HM  - Continue HM 2 mg q4h prn  - Can adjust regimen before next visit if necessary    [name] of skin; [name]; Cancer associated pain; Right shoulder pain, unspecified chronicity; allodynia; diffuse myofacial pain; chronic pain syndrome  ***  Chronic, not complaint today; not at goal and secondary pain generator.  - Continue Voltaren 1% gel.   - Repeat TPIs prn  - Rotation from oxycodone to Butrans as above  - Continue PT  - Encouraged slow resumption of normal activity and times when pain has decreased  - Encouraged patient to follow-up with PCP regarding her concerns about possible malnutrition      Medication Monitoring  [place] PDMP was reviewed today and it was appropriate.  Patient brought medications to clinic today for pill count. Counts were appropriate.  Last urine toxicology screen: 12/04/21  Treatment agreement last reviewed and signed: 12/04/21  Aberrancy with treatment plan? no  Opioid Risk: low  Pain psychology: no  Benzodiazepines: no  Naloxone: N/A  EKG: NSR with normal Qtc (422 on 09/04/21)  MME: ~120    I have reviewed the [facility] Medical Board statement on use of controlled substances for the treatment of pain as well as the CDC Guideline for Prescribing Opioids for Chronic Pain. I have reviewed the [place] Controlled Substance Monitoring Database.    No follow-ups on file.    PLAN:  Today I have prescribed:  Requested Prescriptions      No prescriptions requested or ordered in this encounter     No orders of the defined types were placed in this encounter.     HPI  [name] [name] is a 22 y.o. being followed at [facility] for complaint of chronic pain localized to her shoulders and abdomen.      Patient was last seen in December, at which point she had recent hospitalization for severe flares and GI bleed. She was no longer taking Eliquis. Urine tox screen and opioid agreement current and appropriate. Reviewed labs from 01/29/22, CMP showed normal Creatinine at 0.36, normal AST at 16, and normal ALT at 13. The patient reported that she was overall worse due to recent hospitalization. She was currently taking oxycodone 10 mg q4h prn in conjunction with oxycodone 5 mg TID-QID prn from hospital discharge. We had long discussion about weaning oxycodone safely and starting Butrans patch when she was ready and pain was adequately under control. Discussed continuing to wean oxycodone as tolerated and once she was stable at 2-3 tabs/day we could start Butrans 10 mcg/hr patch. Patient would reach out to our clinic if she needed any medication adjustments prior to next visit. She was hopeful to return to driving, though we encourage she take her time in controlling her pain before making switch to Butrans abruptly.     Since last visit, the patient has followed with [facility] and GI. The patient was hospitalized from 03/10/22-03/27/22 for ongoing GI bleed and post surgical abdominal pain. She was instructed to stop gabapentin and start Lyrica, as well as was switched to HM 2 mg q4h prn.     Today, ***    Current analgesic regimen:  - Lyrica 25 mg TID  - Baclofen 5 mg TID prn  - Oxycodone 10mg  q4 PRN  -  HM 2 mg q4h prn  - Tylenol PRN  - Voltaren 1% gel    Patient did not fill out intake due to virtual.     Previous Medication Trials:  NSAIDS: Toradol (c/f GIB after)  Antidepressant/anxiety: Cymbalta (stopped due to mood side effects), amitriptyline (insomnia and increased J-pouch output)  Anticonvulsants: Gabapentin  Muscle relaxants: Robaxin  Topicals: lidocaine, coltaren gel  Short-acting opiates: oxycodone, PO HM  Long-acting opiates:   Other: Tylenol    Previous Interventions  Procedures:   - TPIs in August 2023 with initial pain flare, subsequent improvement, and near immediate relief flare of pain; hyperalgesic with procedure  - TPIs of Right trapezius, rhomboid, infraspinatus on 12/04/2021.  - TPIs on 10/25  - Celiac plexus block on 11/1 (severe post-procedural pain flare requiring admission for pain control, significant hyperalgesia)  - TPIs 11/22    PT: Yes    Allergies  Allergies   Allergen Reactions    Adhesive Tape-Silicones Hives and Rash     Paper tape  And tegederm ok    Ferrlecit [Sodium Ferric Gluconat-Sucrose] Swelling and Rash    Levofloxacin Swelling and Rash     Swelling in mouth, rash,     Methylnaltrexone      Per Patient: I lost bowel control, severe abdominal cramping, and elevated BP    Neomycin Swelling     Rxn after ear drops; ear swelling    Papaya Hives    Morphine Nausea And Vomiting    Zosyn [Piperacillin-Tazobactam] Itching and Rash     Red and itchy    Compazine [Prochlorperazine] Other (See Comments)     Extreme agitation    Latex, Natural Rubber Rash       Home Medications    Current Outpatient Medications   Medication Sig Dispense Refill    acetaminophen (TYLENOL) 500 MG tablet Take 2 tablets (1,000 mg total) by mouth every eight (8) hours. 30 tablet 0    ammonium lactate (LAC-HYDRIN) 12 % lotion Apply 1 Application topically two (2) times a day.      ANTACID-ANTIGAS 200-200-20 mg/5 mL Susp       baclofen (LIORESAL) 5 mg Tab tablet Take 1 tablet (5 mg total) by mouth Three (3) times a day as needed for muscle spasms. 30 tablet 0    buprenorphine 10 mcg/hour PTWK transdermal patch Place 1 patch on the skin and change every seven (7) days. 4 patch 0    cholecalciferol, vitamin D3 25 mcg, 1,000 units,, 1,000 unit (25 mcg) tablet Take 1 tablet (25 mcg total) by mouth daily. 100 tablet 3    clobetasoL (TEMOVATE) 0.05 % ointment Apply topically two (2) times a day. To stubborn/thick rashes on hands/feet ears until clear/smooth. Restart as needed 60 g 3    diclofenac sodium (VOLTAREN) 1 % gel Apply 2 g topically four (4) times a day. 100 g 0    famotidine (PEPCID) 20 MG tablet Take 1 tablet (20 mg total) by mouth nightly. 90 tablet 3    fluticasone propionate (FLONASE) 50 mcg/actuation nasal spray 1 spray on skin prior to application of butrans patch to reduce irritation and itching 16 g 0    HYDROmorphone (DILAUDID) 2 MG tablet Take 1 tablet (2 mg total) by mouth every four (4) hours as needed for severe pain for up to 5 days. 10 tablet 0    hydrOXYzine (ATARAX) 50 MG tablet Take 1 tablet (50 mg total) by mouth every six (6) hours as needed for anxiety. 30  tablet 0    hyoscyamine (LEVSIN) 0.125 mg tablet Take 1 tablet (0.125 mg total) by mouth every four (4) hours as needed for cramping.      mirtazapine (REMERON) 7.5 MG tablet Take 1 tablet (7.5 mg total) by mouth nightly. 90 tablet 3    naloxone (NARCAN) 4 mg nasal spray One spray in either nostril once for known/suspected opioid overdose. May repeat every 2-3 minutes in alternating nostril til EMS arrives (Patient not taking: Reported on 03/31/2022) 2 each 0    OLANZapine (ZYPREXA) 7.5 MG tablet TAKE 1 TABLET BY MOUTH EVERY NIGHTLY 90 tablet 1    ondansetron (ZOFRAN-ODT) 8 MG disintegrating tablet Take 1 tablet (8 mg total) by mouth every twelve (12) hours as needed for nausea.      oxyCODONE (ROXICODONE) 10 mg immediate release tablet Take 1 tablet (10 mg total) by mouth every four (4) hours as needed for pain for up to 5 days. 10 tablet 0    pantoprazole (PROTONIX) 40 MG tablet Take 1 tablet (40 mg total) by mouth daily. 30 tablet 0    pimecrolimus (ELIDEL) 1 % cream Apply topically two (2) times a day. To face as needed for rash 30 g 11    polyethylene glycol (GLYCOLAX) 17 gram/dose powder Mix 1 capful (17 grams) in 4-8 ounces of water,juice or tea and drink daily 510 g 0    pregabalin (LYRICA) 25 MG capsule Take 1 capsule (25 mg total) by mouth Three (3) times a day. 90 capsule 0    triamcinolone (KENALOG) 0.1 % cream Apply topically two (2) times a day. 28 g 3    zinc oxide 10 % Crea Apply 1 application. topically daily as needed.       No current facility-administered medications for this visit.       ROS  8 systems reviewed and negative except as mentioned in HPI.       Physical Exam    VITALS:   There were no vitals filed for this visit.      Wt Readings from Last 3 Encounters:   03/31/22 61.2 kg (135 lb)   03/26/22 62 kg (136 lb 11.2 oz)   02/28/22 60.3 kg (133 lb)     Deferred due to virtual

## 2022-04-01 NOTE — Unmapped (Signed)
Chronic Pain Follow Up Note    I spent 14 minutes on the real-time audio and video with the patient. I spent an additional 18 minutes on pre- and post-visit activities. The patient consented to this consult.    The patient was physically located in [place] or a state in which I am permitted to provide care. The patient understood that s/he may incur co-pays and cost sharing, and agreed to the telemedicine visit. The visit was completed via phone and/or video, which was appropriate and reasonable under the circumstances given the patient's presentation at the time.    The patient has been advised of the potential risks and limitations of this mode of treatment (including, but not limited to, the absence of in-person examination) and has agreed to be treated using telemedicine. The patient's/patient's family's questions regarding telemedicine have been answered. No vitals or physical exam was performed but the previous exam was copied forward in this note for continuity.     If the phone/video visit was completed in an ambulatory setting, the patient has also been advised to contact their provider???s office for worsening conditions, and seek emergency medical treatment and/or call 911 if the patient deems either necessary.    Visit modifiers:   POS 02, 95 and CR (virtual visit by phone)    -Location of patient during visit: Home in [place]  -Provider location: [facility]   -Names of all people present during visit: Vivianne Seda, [name] Daft, NP, and [employer] (Scribe)       Assessment and Plan    1. Diffuse myofascial pain syndrome    2. Chronic pain syndrome    3. Generalized abdominal pain    4. Cancer associated pain    5. [name] of skin        [name] [name] is a 22 y.o. female with past medical history significant for DVT (RUE, s/p Eliquis x3 months), [name] syndrome s/p colectomy, desmoid tumor, anemia, and severe protein-calorie malnutrition who is being followed at [facility] for abdominal and right shoulder pain that is consistent with the diagnosis of cancer associated pain.    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 Pt has continued and now chronic pain related to her tumors, has R shoulder pain as well. Follows with [facility] Dr [name] as well as by [facility] oncology palliative care (Dr [name]) for pall care sx and pain mng.    Chronic abdominal pain  Chronic, not at goal. Urine tox screen and opioid agreement current and appropriate. Today, patient reports that she is overall worse due to recent hospitalization. She is currently taking oxycodone 10 mg q4h prn in conjunction with Butrans 10 mcg and Lyrica 25 mg TID from hospital discharge. Patient will reach out to our clinic if she needs any medication adjustments prior to next visit. We also discussed titrating Lyrica instead of the Butrans patch in order to reduce Oxycodone dose as she is hesitant to increase Butrans due to concern for possible side effects and dependence.  - Continue Lyrica 25 mg TID  - Continue Baclofen 5mg  TID prn; refilled    - Continue Butrans patch at 10 mcg/hr weekly  - Continue Oxycodone 10 mg Q4H prn  - Did not start HM - order cancelled  - Can adjust regimen before next visit if necessary    [name] of skin; Cancer associated  pain; diffuse myofacial pain; chronic pain syndrome  Chronic, ongoing. Not at goal and secondary pain generator. She would like to repeat TPIs at next clinic visit  - Continue Voltaren 1% gel.   - Repeat TPIs at next clinic visit  - Rotation from oxycodone to [facility] as above  - Continue PT  - Encouraged slow resumption of normal activity and times when pain has decreased  - Encouraged patient to follow-up with PCP regarding her concerns about possible malnutrition    Medication Monitoring  [place] PDMP was reviewed today and it was appropriate.  Patient brought medications to clinic today for pill count. Counts were appropriate.  Last urine toxicology screen: 12/04/21  Treatment agreement last reviewed and signed: 12/04/21  Aberrancy with treatment plan? no  Opioid Risk: low  Pain psychology: no  Benzodiazepines: no  Naloxone: N/A  EKG: NSR with normal Qtc (422 on 09/04/21)  MME: ~120    I have reviewed the [name] Medical Board statement on use of controlled substances for the treatment of pain as well as the CDC Guideline for Prescribing Opioids for Chronic Pain. I have reviewed the  Controlled Substance Monitoring Database.    Return in about 4 weeks (around 04/29/2022).    PLAN:  Today I have prescribed:  Requested Prescriptions     Signed Prescriptions Disp Refills    baclofen (LIORESAL) 5 mg Tab tablet 30 tablet 0     Sig: Take 1 tablet (5 mg total) by mouth Three (3) times a day as needed for muscle spasms.    buprenorphine 10 mcg/hour PTWK transdermal patch 4 patch 0     Sig: Place 1 patch on the skin and change every seven (7) days.    oxyCODONE (ROXICODONE) 10 mg immediate release tablet 120 tablet 0     Sig: Take 1 tablet (10 mg total) by mouth every six (6) hours as needed for pain.    pregabalin (LYRICA) 25 MG capsule 90 capsule 0     Sig: Take 1 capsule (25 mg total) by mouth Three (3) times a day.     Orders Placed This Encounter   Procedures    Trigger Point Injs 3+ Muscles [phone])     TPIs Dr [name] quad first available     Standing Status:   Future     Standing Expiration Date:   04/02/2023      HPI  [name] [name] is a 22 y.o. being followed at [facility] for complaint of chronic pain localized to her shoulders and abdomen.      Patient was last seen in December, at which point she had recent hospitalization for severe flares and GI bleed. She was no longer taking Eliquis. Urine tox screen and opioid agreement current and appropriate. Reviewed labs from 01/29/22, CMP showed normal Creatinine at 0.36, normal AST at 16, and normal ALT at 13. The patient reported that she was overall worse due to recent hospitalization. She was currently taking oxycodone 10 mg q4h prn in conjunction with oxycodone 5 mg TID-QID prn from hospital discharge. We had long discussion about weaning oxycodone safely and starting Butrans patch when she was ready and pain was adequately under control. Discussed continuing to wean oxycodone as tolerated and once she was stable at 2-3 tabs/day we could start Butrans 10 mcg/hr patch. Patient would reach out to our clinic if she needed any medication adjustments prior to next visit. She was hopeful to return to driving, though we encourage she take  her time in controlling her pain before making switch to Butrans abruptly.     Since last visit, the patient has followed with [facility] and GI. The patient was hospitalized from 03/10/22-03/27/22 for ongoing GI bleed and post surgical abdominal pain. She was instructed to stop gabapentin and start Lyrica, as well as started on HM 2 mg q4h prn in addition to Butrans 10 mcg and Lyrica 25 mg TID.    Today, the patient reports being hospitalized for 19 days and was discharged on Thursday. The patient inquired about scheduling a repeat TPI with [name] for her worsened shoulder blade pain, attributed to vacuuming the house.    In terms of medications, she reports being on the Butrans patch. She also reports taking 3-4 Oxycodone 10 mg prn, depending on the day. She also notes being sent Dilaudid, but has not picked it up yet. The patient states that the Oxycodone 10 mg q4 is more helpful. The patient expresses hesitancy regarding increasing the Butrans patch. She also notes that the Lyrica 25 mg TID has been helpful. The patient reports taking the Baclofen 5 mg TID prn.     Current analgesic regimen:  - Lyrica 25 mg TID  - Baclofen 5 mg TID prn  - Oxycodone 10mg  q4 PRN  - HM 2 mg q4h prn  - Tylenol PRN  - Voltaren 1% gel    Previous Medication Trials:  NSAIDS: Toradol (c/f GIB after)  Antidepressant/anxiety: Cymbalta (stopped due to mood side effects), amitriptyline (insomnia and increased J-pouch output)  Anticonvulsants: Gabapentin  Muscle relaxants: Robaxin  Topicals: lidocaine, coltaren gel  Short-acting opiates: oxycodone, PO HM  Long-acting opiates:   Other: Tylenol    Previous Interventions  Procedures:   - TPIs in August 2023 with initial pain flare, subsequent improvement, and near immediate relief flare of pain; hyperalgesic with procedure  - TPIs of Right trapezius, rhomboid, infraspinatus on 12/04/2021.  - TPIs on 10/25  - Celiac plexus block on 11/1 (severe post-procedural pain flare requiring admission for pain control, significant hyperalgesia)  - TPIs 11/22    PT: Yes    Allergies  Allergies   Allergen Reactions    Adhesive Tape-Silicones Hives and Rash     Paper tape  And tegederm ok    Ferrlecit [Sodium Ferric Gluconat-Sucrose] Swelling and Rash    Levofloxacin Swelling and Rash     Swelling in mouth, rash,     Methylnaltrexone      Per Patient: I lost bowel control, severe abdominal cramping, and elevated BP    Neomycin Swelling     Rxn after ear drops; ear swelling    Papaya Hives    Morphine Nausea And Vomiting    Zosyn [Piperacillin-Tazobactam] Itching and Rash     Red and itchy    Compazine [Prochlorperazine] Other (See Comments)     Extreme agitation    Latex, Natural Rubber Rash       Home Medications    Current Outpatient Medications   Medication Sig Dispense Refill    acetaminophen (TYLENOL) 500 MG tablet Take 2 tablets (1,000 mg total) by mouth every eight (8) hours. 30 tablet 0    ammonium lactate (LAC-HYDRIN) 12 % lotion Apply 1 Application topically two (2) times a day.      ANTACID-ANTIGAS 200-200-20 mg/5 mL Susp       baclofen (LIORESAL) 5 mg Tab tablet Take 1 tablet (5 mg total) by mouth Three (3) times a day as needed for muscle spasms.  30 tablet 0    buprenorphine 10 mcg/hour PTWK transdermal patch Place 1 patch on the skin and change every seven (7) days. 4 patch 0    cholecalciferol, vitamin D3 25 mcg, 1,000 units,, 1,000 unit (25 mcg) tablet Take 1 tablet (25 mcg total) by mouth daily. 100 tablet 3    clobetasoL (TEMOVATE) 0.05 % ointment Apply topically two (2) times a day. To stubborn/thick rashes on hands/feet ears until clear/smooth. Restart as needed 60 g 3    diclofenac sodium (VOLTAREN) 1 % gel Apply 2 g topically four (4) times a day. 100 g 0    famotidine (PEPCID) 20 MG tablet Take 1 tablet (20 mg total) by mouth nightly. 90 tablet 3    fluticasone propionate (FLONASE) 50 mcg/actuation nasal spray 1 spray on skin prior to application of butrans patch to reduce irritation and itching 16 g 0    hydrOXYzine (ATARAX) 50 MG tablet Take 1 tablet (50 mg total) by mouth every six (6) hours as needed for anxiety. 30 tablet 0    hyoscyamine (LEVSIN) 0.125 mg tablet Take 1 tablet (0.125 mg total) by mouth every four (4) hours as needed for cramping.      mirtazapine (REMERON) 7.5 MG tablet Take 1 tablet (7.5 mg total) by mouth nightly. 90 tablet 3    naloxone (NARCAN) 4 mg nasal spray One spray in either nostril once for known/suspected opioid overdose. May repeat every 2-3 minutes in alternating nostril til EMS arrives (Patient not taking: Reported on 03/31/2022) 2 each 0    OLANZapine (ZYPREXA) 7.5 MG tablet TAKE 1 TABLET BY MOUTH EVERY NIGHTLY 90 tablet 1    ondansetron (ZOFRAN-ODT) 8 MG disintegrating tablet Take 1 tablet (8 mg total) by mouth every twelve (12) hours as needed for nausea.      oxyCODONE (ROXICODONE) 10 mg immediate release tablet Take 1 tablet (10 mg total) by mouth every six (6) hours as needed for pain. 120 tablet 0    pantoprazole (PROTONIX) 40 MG tablet Take 1 tablet (40 mg total) by mouth daily. 30 tablet 0    pimecrolimus (ELIDEL) 1 % cream Apply topically two (2) times a day. To face as needed for rash 30 g 11    polyethylene glycol (GLYCOLAX) 17 gram/dose powder Mix 1 capful (17 grams) in 4-8 ounces of water,juice or tea and drink daily 510 g 0    pregabalin (LYRICA) 25 MG capsule Take 1 capsule (25 mg total) by mouth Three (3) times a day. 90 capsule 0    triamcinolone (KENALOG) 0.1 % cream Apply topically two (2) times a day. 28 g 3    zinc oxide 10 % Crea Apply 1 application. topically daily as needed.       No current facility-administered medications for this visit.       ROS  8 systems reviewed and negative except as mentioned in HPI.       Physical Exam    VITALS:   There were no vitals filed for this visit.      Wt Readings from Last 3 Encounters:   03/31/22 61.2 kg (135 lb)   03/26/22 62 kg (136 lb 11.2 oz)   02/28/22 60.3 kg (133 lb)     Deferred due to virtual     Documentation assistance was provided by [employer], on March 11, 2022 at 8:29 AM for [name] Daft, NP.    April 01, 2022 2:47 PM. Documentation assistance provided by the Scribe. I was  present during the time the encounter was recorded. The information recorded by the Scribe was done at my direction and has been reviewed and validated by me. Rhina Brackett, FNP-C

## 2022-04-01 NOTE — Unmapped (Signed)
It was good to see you today.    I have refilled your medications with no changes.    We will see you in 4 weeks.

## 2022-04-02 DIAGNOSIS — Z932 Ileostomy status: Secondary | ICD-10-CM | POA: Diagnosis not present

## 2022-04-02 DIAGNOSIS — R1084 Generalized abdominal pain: Secondary | ICD-10-CM | POA: Diagnosis not present

## 2022-04-02 DIAGNOSIS — E860 Dehydration: Secondary | ICD-10-CM | POA: Diagnosis not present

## 2022-04-02 DIAGNOSIS — R112 Nausea with vomiting, unspecified: Secondary | ICD-10-CM | POA: Diagnosis not present

## 2022-04-03 DIAGNOSIS — D500 Iron deficiency anemia secondary to blood loss (chronic): Principal | ICD-10-CM

## 2022-04-04 MED ORDER — PREDNISONE 50 MG TABLET
ORAL_TABLET | Freq: Every day | ORAL | 3 refills | 3 days | Status: CP
Start: 2022-04-04 — End: ?

## 2022-04-04 NOTE — Unmapped (Signed)
Texted with [facility]. She will have iron infusion at next visit. Since she reacted at her previous infusion.   Sent in Prednisone 50mg  for 13, 7, 1.  To get in Infusion: Benadryl 50, Pepcid  40 + Tyleon 650 1 hour before infusion.

## 2022-04-05 DIAGNOSIS — R1084 Generalized abdominal pain: Secondary | ICD-10-CM | POA: Diagnosis not present

## 2022-04-06 DIAGNOSIS — R1084 Generalized abdominal pain: Secondary | ICD-10-CM | POA: Diagnosis not present

## 2022-04-07 DIAGNOSIS — R1084 Generalized abdominal pain: Secondary | ICD-10-CM | POA: Diagnosis not present

## 2022-04-07 NOTE — Unmapped (Signed)
[facility] BONE AND SOFT TISSUE ONCOLOGY RETURN VISIT    Encounter Date: 04/08/2022  Patient Name: [name] [name]  Medical Record Number: [id number]    Referring Physician: Sheral Apley, MD  [id number]  [facility]  [place],  [place] [address]    Primary Care Provider: Jarold Motto, [facility]    DIAGNOSIS:  1. [name]    2. SBO (small bowel obstruction) (CMS-HCC)    3. Acute deep vein thrombosis (DVT) of axillary vein of right upper extremity (CMS-HCC)    4. Ileus (CMS-HCC)    5. Severe protein-calorie malnutrition (CMS-HCC)    6. Iron deficiency anemia due to chronic blood loss    7. Physical deconditioning    8. Moderate episode of recurrent major depressive disorder (CMS-HCC)    9. Generalized anxiety disorder with panic attacks            ASSESSMENT/PLAN: 22 y.o. 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    Desmoid fibromatosis, history of [name]     She has had numerous sites of disease, including paraspinal, chest wall, right arm/wrist. She has had several cryoablations with Dr. Cathie Hoops which have seemed to offer clinical benefit. She trialed sorafenib which seemed to lead to shrinkage of lesions, but she had significant intolerance with dizziness, presyncope, fatigue, hair loss, rash, GI side effects.      We have discussed at length the nature of desmoid fibromatosis tumors, the fact they can wax and wane independent of therapy, are not cancers in a metastatic potential, but can be locally infiltrative and cause substantial morbidity to the site of disease and occasionally mortality. I reviewed that the emerging standard of care is observation based on the fact that 20-30% will experience spontaneous regression in the year following diagnosis. Given her long experience with desmoids, complete regression may be less likely, but we will still approach this cautiously with parallel goals of avoidance of overtreatment (particularly surgical) and palliation of symptoms / preservation of function.     At first visit in 2022, her lesions are in the shoulder, right forearm, chest area, with a possible desmoid in her left thigh. Symptoms are manageable at the moment, after recent cryoablations and steroid injection to the forearm.    06/21/21: Has had a complex few months, on TPN, significant fluid overload, caused worsening desmoid pain. Now off TPN, nutrition slowly improving, tumor pain better. Scans show 2 ill defined soft tissue nodules, concerning for recurrent desmoid. Start with surveillance, given risks of systemic therapy after recent significant GI and medical issues.     09/03/21: Scans show stable disease. Was admitted for abdominal pain, concern for intussusception, N/V, but we discussed that these may not be related to her desmoids. Opted for continued surveillance.    12/03/21: Scans show slow growth when reflecting over the past several months. Importantly, she reports substantially worsening abdominal pain which is centered on her desmoids, poor appetite, weight loss, possible transient obstructive symptoms. Long conversation about options and pros and cons of each. She would like to trial sorafenib again, acknowledging the risks. She will meet with GI, her psychiatry team (she has had challenges with duloxetine), pain team, and may reconnect with Dr. Selena Batten in the interim to discuss other supportive care measures and procedural options.     9/28-10/13/23: Hospitalization for abdominal pain, malnutrition. Pouchoscopy 10/4 without stricture. Initiated on tube feeds. Sorafenib was initiated ~ 10/3 and tolerated reasonably well in house.    12/31/21: Tox  check. Sorafenib 200mg  daily, but a variety of ongoing issues. Diarrhea (which may be drug related), ongoing abdominal pain (perhaps partially desmoid related but not exclusively), intolerance of PO diet and even tube feeds (driven by pain / cramping), rash (drug related), anxiety.     01/10/22-01/22/22: 2-week hospitalization, due to uncontrolled pain in the setting of recent celiac plexus block, complicated by GI bleeding in setting of anticoagulation due to line associated DVT. She subsequently required multiple iron infusions, and was discharged on eliquis 2.5mg  BID.  During the hospitalization was noted to be persistently tachycardic, likely multifactorial etiology.  Additionally during her hospital admission was restarted on TPN in consultation with her GI team.  Patient was discharged on 01/22/2022 on TPN.      12/6-12/13/23: Another hospitalization, for GI bleed in setting of sorafenib and anticoagulation. Pain control was difficult, underwent endoscopy to evaluate for pouchitis.    02/28/22: Continued challenges since hospital stay. Reviewed several key factors: clear growth of desmoid tumors in Sept, prior to initiation of sorafenib. Clear response/shrinkage of desmoid tumors on Dec 10 scans, indicating treatment response to 200mg  sorafenib. Pouch biopsy revealed pouchitis, but this was at the site of an ulcer, thus not unexpected. Depression remains a big challenge, will coordinate with psychiatry. Resume sorafenib 200mg . Reviewed risk of re-bleeding, but this is reduced in the setting of discontinuation of anticoagulation.    1/1-1/18/24: Hospitalization for pain, recurrent GI bleeding. Stopped sorafenib. SSRI trialed but recurrent bleeding thus held as well. Efforts at weaning opioids. Significant depression.    04/08/22: Nirogacestat approved.    Assessment & Plan  1. Desmoids.  I do not think they are the cause of the bleeding. I think the sorafenib was contributing to bleeding, although now it is gone and the bleeding is still happening. I will give her iron today because she is already premedicated. I will keep checking the labs closely. I think it is reasonable to stay out of the hospital.     2. Ovarian dysfunction.  This is a concern for nirogacestat. My guess is that it is likely reversible ovarian dysfunction and ought not to affect her future fertility, but we don't know that for sure with a new drug. We discussed the risks and benefits of Lupron injection. I will try to have her connect with [place] this week.    3. Iron Deficiency.  4. GI Bleeding  I will give her iron today. I will check labs weekly. Discussed with GI, they do not know what they could do to address the bleeding but will get her in for a pouchoscopy as soon as they can.  -consider amicar for bleeding    Anxiety  AYA Needs  Patient is 21 and in the past year has had the collision of numerous medical challenges which have pushed her to see her health and future medical care more clearly. Trying to balance all of her medical issues this with future goals of career, nursing, college. Significant burden of anxiety, maybe depression as well. Was reportedly diagnosed with medical trauma by the counseling office at her university. Also trying to establish her own autonomy after a medicalized childhood  - connected with AYA team, [name]. May need input from [facility] or others at some point  - seen by AYA palliative care sarcoma collaborative, [name], will assist with pain management and symptom control, as well as grappling with current issues relating to cancer, AYA, development     Tumor pain: seen by  AYA sarcoma palliative care, [name], as well as pain team - [name] has been working closely with her  -defer to pain team     Contraception:  -fertility referral pending due to her interest in discussing options with them, especially prior to possible use of nirogacestat which has a risk of amenorrhea / ovarian dysfunction    Line Associated DVT:   Duplex demonstrates DVT shortly after line placement. Treated with Apixaban 5mg  BID x 3 mos, now completed    Plan:  -TPN, repeat pouchoscopy per GI  -continue pain management with pain service  -initiate nirogacestat when the patient is ready, after fertility conversation  -coordinate with GI, psychiatry, AYA team  -RTC 1 month for tox check    I personally reviewed the medical records, pathology and laboratory results and viewed the imaging. All questions were answered to the patient's apparent satisfaction and they voiced understanding and agreement with the plan. Pt has my card and contact information and is encouraged to reach out with any further questions.    Donzetta Sprung, MD/MPH  Assistant Professor  Bone and Soft Tissue Oncology Program  [facility]  Pager: [phone]  Nurse Navigator: Roseanne Reno RN, BSN, OCN      REASON FOR CONSULTATION:   [name] [name] is a 22 y.o. female who is seen in consultation at the request of [name] Jonita Albee, MD for evaluation of her desmoid fibromatosis    HISTORY OF PRESENT ILLNESS:      Attends school at [employer] in [place], [place]      Right arm desmoid is the most bothersome - significant arm cramping, deep aching pain, lasts for ~30 min after cramping starts. Has noticed right hand shaking frequently. 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     Left chest lesion used to be quite uncomfortable, caused swelling in her arm and discomfort. Improved after cryoablation.     Right shoulder blade hurts when she leans back against it, fairly painful. Right chest wall lesion has some associated pain, improving since cryo.     First diagnosed at 21 years old - seen in [place] at first, had numerous cysts. First major issues were two paraspinal lesions, referred to [facility] for biopsy and ultimately resection.  Soon diagnosed with [name], seen by medical genetics early on.  Guided by Dr. Debbe Mounts, referred out to proceduralists as needed.  2011 - had a growing lesion on her back as well as bilateral neck lesions, resected at [facility], found to be desmoid  02/2012 - surgery for thoracic spine desmoid. Maybe another flank lesion removed  2017 - wrist lesion treated by [facility]IR with bupivicaine, kenalog  08/2016 - endoscopy with adenomatous polyp of the colon, non-dysplastic  02/2017 - subcutaneous lesion resection by plastic surgery (postauricular and RUE, RLE) - path revealed epidermal inclusion cysts, osteomas  05/2018: cryotherapy of left anterior chest wall lesion  05/2018: started sulindac but struggled with adherence due to GI side effects  08/2018: cryo #2 to chest wall lesion  12/10/2018: started sorafenib due to progression in left paraspinal lesion, ?posterior chest wall lesion  02/2019: discontinued sorafenib for several reasons: fatigue, dizziness, nausea, hair loss, as well as COVID infection in late November. Derm consulted for rash / hair loss, started on antihistamines, topical steroids. Trialed lower dose but ultimately stopped again 05/2019 due to toxicity.  06/2019: follow up with surgical oncology - plans for observation rather than repeat surgery  07/2019: cryoablation  of anterior chest wall desmoid  07/2019: excision of subcutaneous LE mass and L neck masses, clinically appeared consistent with cysts  08/2019: cryoablation of R posterior chest wall, left paraspinal desmoid     Interval History:  History of Present Illness  The patient presents for evaluation of desmoids. She is accompanied by her mother.    She has been having 5 or more bowel movements daily, which are bright red in color. She has increased pain. She sometimes has been able to go 12 hours without taking oxycodone, but the last few days have increased a lot. The Butrans patch and Lyrica have been helping overall. One day, she went 17 hours without taking oxycodone. Her stomach has been hurting. She has been looking distended the last couple of days on the top and it feels like the front of her rib is lumpy. Her upper abdomen has been swollen for the last couple of days. She has pain on the right side, but she still has the left side pain too. Her ferritin is low at 11.8. She has no energy at all. Her mood is a lot worse. [name] yesterday. Her SSRI was stopped because of the bleeding. She is taking hydroxyzine for the anxiety, which has been helpful. She has not tried Wellbutrin. The only medication she has ever been on is mirtazapine, olanzapine, and hydroxyzine. She is not taking the blood thinner. She is taking Zyprexa 7.5 mg. She is taking pantoprazole, which does help. GI started her on pantoprazole for the ulcer and to help heal it. She takes famotidine at night for reflux. She is taking prednisone for iron infusion reaction.    Her mood is a lot worse. Her boyfriend broke up yesterday. She is not doing well physically. She has high anxiety. Her SSRI was stopped because of the bleeding. She is taking hydroxyzine, which has been helpful. She has not tried Wellbutrin.    Her feet are swollen. She is inquiring if she should back off on the TPN even more than every other day. She was told she had a little fatty liver the last time.        REVIEW OF SYSTEMS:  A comprehensive review of 12 systems was negative except for pertinent positives noted in HPI.    Past Medical History, Surgical History, Family History were reviewed personally. Any changes were updated as above.    Social History:  Attended [employer] in [place], has been interrupted by health issues  Wants to be a Engineer, civil (consulting) in pediatric hematology/oncology   Mother Natalia Leatherwood is a strong advocate, often present at visits    Pregnancy status: premenopausal, interested in future children    ALLERGIES/MEDICATIONS:  Reviewed in Epic    PHYSICAL EXAM:   BP 136/69  - Pulse 80  - Temp 36.9 ??C (98.5 ??F)  - Resp 15  - Ht 165.1 cm (5' 5)  - Wt 61.6 kg (135 lb 14.4 oz)  - LMP 02/13/2022  - SpO2 98%  - BMI 22.61 kg/m??   ECOG 1  Gen - well appearing, in no acute distress, mother accompanying her   Eyes - conjunctivae clear, PERRL  ENT -no scleral icterus, moist mucous membranes  Lymph - no cervical or supraclavicular lymphadenopathy appreciated  Resp - clear to auscultation bilaterally, no wheezes, crackles or rales  CV - normal rate, regular rhythm, no murmur appreciated, no lower extremity edema noted  GI - abdomen slightly firm, diffusely tender, prior surgical scars appear  well-healed, no masses, bowel sounds normal  Skin - no rashes, no lesions noted  Neuro - AOx3, EOM intact, no facial droop, speech fluent and coherent, moves all extremities without asymmetry  Psych - affect anxious, mood depressed  MSK - no joint tenderness or swelling    PATHOLOGY:   Pathology was personally reviewed as described in the HPI, detailed in Epic.     LABS:  Reviewed in Epic    RADIOLOGY:  Imaging was personally viewed and interpreted as summarized in the history (see above).

## 2022-04-08 ENCOUNTER — Ambulatory Visit: Admit: 2022-04-08 | Discharge: 2022-04-09 | Payer: PRIVATE HEALTH INSURANCE

## 2022-04-08 ENCOUNTER — Other Ambulatory Visit: Admit: 2022-04-08 | Discharge: 2022-04-09 | Payer: PRIVATE HEALTH INSURANCE

## 2022-04-08 DIAGNOSIS — K921 Melena: Principal | ICD-10-CM

## 2022-04-08 DIAGNOSIS — D500 Iron deficiency anemia secondary to blood loss (chronic): Principal | ICD-10-CM

## 2022-04-08 DIAGNOSIS — Z91040 Latex allergy status: Secondary | ICD-10-CM | POA: Diagnosis not present

## 2022-04-08 DIAGNOSIS — T7840XA Allergy, unspecified, initial encounter: Secondary | ICD-10-CM | POA: Diagnosis not present

## 2022-04-08 DIAGNOSIS — K56609 Unspecified intestinal obstruction, unspecified as to partial versus complete obstruction: Secondary | ICD-10-CM | POA: Diagnosis not present

## 2022-04-08 DIAGNOSIS — D48119 Desmoid tumor of unspecified site: Secondary | ICD-10-CM | POA: Diagnosis not present

## 2022-04-08 DIAGNOSIS — Z932 Ileostomy status: Secondary | ICD-10-CM | POA: Diagnosis not present

## 2022-04-08 DIAGNOSIS — I82A11 Acute embolism and thrombosis of right axillary vein: Secondary | ICD-10-CM | POA: Diagnosis not present

## 2022-04-08 DIAGNOSIS — E860 Dehydration: Secondary | ICD-10-CM | POA: Diagnosis not present

## 2022-04-08 DIAGNOSIS — R1084 Generalized abdominal pain: Secondary | ICD-10-CM | POA: Diagnosis not present

## 2022-04-08 DIAGNOSIS — K567 Ileus, unspecified: Secondary | ICD-10-CM | POA: Diagnosis not present

## 2022-04-08 DIAGNOSIS — Z79899 Other long term (current) drug therapy: Secondary | ICD-10-CM | POA: Diagnosis not present

## 2022-04-08 DIAGNOSIS — R112 Nausea with vomiting, unspecified: Secondary | ICD-10-CM | POA: Diagnosis not present

## 2022-04-08 LAB — CBC
HEMATOCRIT: 26.5 % — ABNORMAL LOW (ref 34.0–44.0)
HEMOGLOBIN: 8.6 g/dL — ABNORMAL LOW (ref 11.3–14.9)
MEAN CORPUSCULAR HEMOGLOBIN CONC: 32.6 g/dL (ref 32.0–36.0)
MEAN CORPUSCULAR HEMOGLOBIN: 25 pg — ABNORMAL LOW (ref 25.9–32.4)
MEAN CORPUSCULAR VOLUME: 76.6 fL — ABNORMAL LOW (ref 77.6–95.7)
MEAN PLATELET VOLUME: 9.4 fL (ref 6.8–10.7)
PLATELET COUNT: 272 10*9/L (ref 150–450)
RED BLOOD CELL COUNT: 3.46 10*12/L — ABNORMAL LOW (ref 3.95–5.13)
RED CELL DISTRIBUTION WIDTH: 15.1 % (ref 12.2–15.2)
WBC ADJUSTED: 3.7 10*9/L (ref 3.6–11.2)

## 2022-04-08 LAB — FERRITIN: FERRITIN: 7.7 ng/mL

## 2022-04-08 LAB — IRON & TIBC
IRON SATURATION: 4 % — ABNORMAL LOW (ref 20–55)
IRON: 13 ug/dL — ABNORMAL LOW
TOTAL IRON BINDING CAPACITY: 337 ug/dL (ref 250–425)

## 2022-04-08 MED ADMIN — predniSONE (DELTASONE) tablet 50 mg: 50 mg | ORAL | @ 21:00:00 | Stop: 2022-04-08

## 2022-04-08 MED ADMIN — famotidine (PF) (PEPCID) injection 40 mg: 40 mg | INTRAVENOUS | @ 21:00:00 | Stop: 2022-04-08

## 2022-04-08 MED ADMIN — diphenhydrAMINE (BENADRYL) capsule 50 mg: 50 mg | ORAL | @ 21:00:00 | Stop: 2022-04-08

## 2022-04-08 MED ADMIN — acetaminophen (TYLENOL) tablet 650 mg: 650 mg | ORAL | @ 21:00:00 | Stop: 2022-04-08

## 2022-04-08 MED ADMIN — famotidine (PF) (PEPCID) injection 20 mg: 20 mg | INTRAVENOUS

## 2022-04-08 MED ADMIN — methylPREDNISolone sodium succinate (PF) (SOLU-Medrol) injection 125 mg: 125 mg | INTRAVENOUS

## 2022-04-08 MED ADMIN — sodium chloride (NS) 0.9 % infusion: 20 mL/h | INTRAVENOUS | @ 22:00:00

## 2022-04-08 MED ADMIN — sodium ferric gluconate (FERRLECIT) 250 mg in sodium chloride (NS) 0.9 % 100 mL IVPB: 250 mg | INTRAVENOUS | @ 22:00:00 | Stop: 2022-04-08

## 2022-04-08 MED ADMIN — sodium chloride 0.9% (NS) bolus 1,000 mL: 1000 mL | INTRAVENOUS

## 2022-04-08 MED ADMIN — diphenhydrAMINE (BENADRYL) injection 25 mg: 25 mg | INTRAVENOUS

## 2022-04-09 ENCOUNTER — Encounter
Admit: 2022-04-09 | Discharge: 2022-04-09 | Payer: PRIVATE HEALTH INSURANCE | Attending: Anesthesiology | Primary: Anesthesiology

## 2022-04-09 ENCOUNTER — Ambulatory Visit: Admit: 2022-04-09 | Discharge: 2022-04-09 | Payer: PRIVATE HEALTH INSURANCE

## 2022-04-09 DIAGNOSIS — Z888 Allergy status to other drugs, medicaments and biological substances status: Secondary | ICD-10-CM | POA: Diagnosis not present

## 2022-04-09 DIAGNOSIS — Z8719 Personal history of other diseases of the digestive system: Secondary | ICD-10-CM | POA: Diagnosis not present

## 2022-04-09 DIAGNOSIS — K922 Gastrointestinal hemorrhage, unspecified: Secondary | ICD-10-CM | POA: Diagnosis not present

## 2022-04-09 DIAGNOSIS — D759 Disease of blood and blood-forming organs, unspecified: Secondary | ICD-10-CM | POA: Diagnosis not present

## 2022-04-09 DIAGNOSIS — Z8616 Personal history of COVID-19: Secondary | ICD-10-CM | POA: Diagnosis not present

## 2022-04-09 DIAGNOSIS — K921 Melena: Secondary | ICD-10-CM | POA: Diagnosis not present

## 2022-04-09 DIAGNOSIS — Z885 Allergy status to narcotic agent status: Secondary | ICD-10-CM | POA: Diagnosis not present

## 2022-04-09 DIAGNOSIS — Z91040 Latex allergy status: Secondary | ICD-10-CM | POA: Diagnosis not present

## 2022-04-09 DIAGNOSIS — K219 Gastro-esophageal reflux disease without esophagitis: Secondary | ICD-10-CM | POA: Diagnosis not present

## 2022-04-09 DIAGNOSIS — Z881 Allergy status to other antibiotic agents status: Secondary | ICD-10-CM | POA: Diagnosis not present

## 2022-04-09 DIAGNOSIS — Z79899 Other long term (current) drug therapy: Secondary | ICD-10-CM | POA: Diagnosis not present

## 2022-04-09 DIAGNOSIS — Z980 Intestinal bypass and anastomosis status: Secondary | ICD-10-CM | POA: Diagnosis not present

## 2022-04-09 MED ORDER — AMINOCAPROIC ACID 500 MG TABLET
ORAL_TABLET | Freq: Four times a day (QID) | ORAL | 1 refills | 30 days
Start: 2022-04-09 — End: 2022-06-08

## 2022-04-09 MED ADMIN — Propofol (DIPRIVAN) injection: INTRAVENOUS | @ 21:00:00 | Stop: 2022-04-09

## 2022-04-09 MED ADMIN — heparin, porcine (PF) 100 unit/mL injection 200 Units: 200 [IU] | INTRAVENOUS | @ 01:00:00 | Stop: 2022-04-09

## 2022-04-09 MED ADMIN — fentaNYL (PF) (SUBLIMAZE) injection: INTRAVENOUS | @ 21:00:00 | Stop: 2022-04-09

## 2022-04-09 MED ADMIN — cetirizine (ZYRTEC) tablet 10 mg: 10 mg | ORAL | @ 01:00:00 | Stop: 2022-04-08

## 2022-04-09 MED ADMIN — ondansetron (ZOFRAN) injection: INTRAVENOUS | @ 21:00:00 | Stop: 2022-04-09

## 2022-04-09 MED ADMIN — lactated Ringers infusion: 10 mL/h | INTRAVENOUS | @ 20:00:00 | Stop: 2022-04-09

## 2022-04-09 MED ADMIN — lidocaine (XYLOCAINE) 20 mg/mL (2 %) injection: INTRAVENOUS | @ 21:00:00 | Stop: 2022-04-09

## 2022-04-09 NOTE — Unmapped (Signed)
Infusion Progress Note    Patient Name: [name] [name]  Patient Age: 22 y.o.  Encounter Date: 04/08/2022  Allergies   Allergen Reactions    Adhesive Tape-Silicones Hives and Rash     Paper tape  And tegederm ok    Ferrlecit [Sodium Ferric Gluconat-Sucrose] Swelling and Rash    Levofloxacin Swelling and Rash     Swelling in mouth, rash,     Methylnaltrexone      Per Patient: I lost bowel control, severe abdominal cramping, and elevated BP    Neomycin Swelling     Rxn after ear drops; ear swelling    Papaya Hives    Morphine Nausea And Vomiting    Zosyn [Piperacillin-Tazobactam] Itching and Rash     Red and itchy    Compazine [Prochlorperazine] Other (See Comments)     Extreme agitation    Latex, Natural Rubber Rash       Reason for visit  Infusion Center visit   Today is CYCLE 1/ DAY 1 of ferric gluconate therapy.   Chief Complaint: significant generalized itching, skin erythema   Oncologist: Donzetta Sprung, MD        Assessment/Plan:  Hypersensitivity reaction Grade 2  --Symptoms: generalized itching  --Amount Infused:3/4 of total amount of ferric gluconate   --Reaction medications: Given Diphenhydramine 25mg , Famotidine 20mg , Solumedrol 125mg , Zyrtec 10mg        Disposition: Did not restart infusion. Given previous history of severe reactions to IV iron.    Interval History/HPI:   [name] is a 22 yo with inflammatory bowel disease and severe iron deficiency. She and her mother give a history of previous significant reactions to IV iron. Admitted and received IV iron x 4 and tolerated those infusions. She took prednisone 50 mg po 13 hrs, 7 hrs and 1 hr prior to her IV iron infusion today. After receiving approximately 3/4 of the total volume of ferric gluconate she developed generalized itching with scattered areas of erythema, no distinct hives      ROS:  All other systems reviewed were negative.      Physical Exam:  Vital Signs:  OXYGEN SAT:  Vitals:    04/08/22 1722 04/08/22 1740 04/08/22 1804 04/08/22 1850   BP: 104/50 106/54 117/57 103/52   Pulse: 89 87 80 82   Resp:  16 16 16    Temp: 36.7 ??C (98.1 ??F)      TempSrc: Oral      SpO2: 98% 98% 99% 99%       General:    Healthy-appearing, resting in no acute distress.  HEENT:   Normocephalic and atraumatic. No scleral icterus or conjunctival injection. Oral mucosa moist without ulceration, erythema, exudate or purpura.  Cardiovascular:    Regular rate, & rhythm. S1/S2 No significant murmurs or gallops.  Respiratory:   Bilateral breath sounds clear to auscultation without adventitious sounds.  No increased work of breathing noted.   Gastrointestinal:    Abdomen soft, nontender, non-distended.  Bowel sounds present in all quadrants.  Skin:   Grossly intact, warm/dry, scattered areas of erythema, no distinct hives.  Neuro:   Alert, oriented X4.      Results:  Recent Labs     04/08/22  1332   WBC 3.7   HGB 8.6*   HCT 26.5*   PLT 272     No results for input(s): NA, K, CL, CO2, BUN, CREATININE, GLU, CALCIUM, MG, ALBUMIN, PROT, BILITOT, AST, ALT, ALKPHOS, LDH, APTT in the last 72 hours.  Invalid input(s): PROTIME-INR        I personally spent 30 minutes face-to-face and non-face-to-face in the care of this patient, which includes all pre, intra, and post visit time on the date of service.         Tiana Loft, PA-C  [facility]ED PRACTICE PROVIDER FOR INFUSION PROGRAM

## 2022-04-09 NOTE — Unmapped (Signed)
Received patient at 66 from [place], report given. Patient called out with c/o itching that was starting on her chest, family endorsed some redness noted on her cheeks as well; patient had received 20mL's of her infusion at that point. Infusion stopped and emergency meds given as ordered; after 30 mins of rest itching was still present but better. Per Tiana Loft, PA zyrtec being given and patient will not be re-challenged for the iron tonight. Patient was stable at time of discharge; self-ambulated to lobby with mother.

## 2022-04-09 NOTE — Unmapped (Signed)
1605: pt arrived to unit. No complaints. Await medication from pharmacy  1620: pt premedicated and 1hr wait per orders to begin infusion. Pt states had steroid at home at 13hr/7hr prior to infusion appointment time. 1hr predose given in clinic  1720: pt dsg chage today in lab when blood work obtained although date on dsg 04/09/22.  1805: pt moved to room 42 for kristen rn to assume patient

## 2022-04-09 NOTE — Unmapped (Signed)
Lab on 04/08/2022   Component Date Value Ref Range Status    WBC 04/08/2022 3.7  3.6 - 11.2 10*9/L Final    RBC 04/08/2022 3.46 (L)  3.95 - 5.13 10*12/L Final    HGB 04/08/2022 8.6 (L)  11.3 - 14.9 g/dL Final    HCT 16/12/9602 26.5 (L)  34.0 - 44.0 % Final    MCV 04/08/2022 76.6 (L)  77.6 - 95.7 fL Final    MCH 04/08/2022 25.0 (L)  25.9 - 32.4 pg Final    MCHC 04/08/2022 32.6  32.0 - 36.0 g/dL Final    RDW 54/11/8117 15.1  12.2 - 15.2 % Final    MPV 04/08/2022 9.4  6.8 - 10.7 fL Final    Platelet 04/08/2022 272  150 - 450 10*9/L Final    Ferritin 04/08/2022 7.7  7.3 - 270.7 ng/mL Final    Iron 04/08/2022 13 (L)  50 - 170 ug/dL Final    TIBC 14/78/2956 337  250 - 425 ug/dL Final    Iron Saturation (%) 04/08/2022 4 (L)  20 - 55 % Final          RED ZONE Means: RED ZONE: Take action now!     You need to be seen right away  Symptoms are at a severe level of discomfort    Call 911 or go to your nearest  Hospital for help     - Bleeding that will not stop    - Hard to breathe    - New seizure - Chest pain  - Fall or passing out  -Thoughts of hurting    yourself or others      Call 911 if you are going into the RED ZONE                  YELLOW ZONE Means:     Please call with any new or worsening symptom(s), even if not on this list.  Call [phone]  After hours, weekends, and holidays - you will reach a long recording with specific instructions, If not in an emergency such as above, please listen closely all the way to the end and choose the option that relates to your need.   You can be seen by a provider the same day through our Same Day Acute Care for Patients with Cancer program.      YELLOW ZONE: Take action today     Symptoms are new or worsening  You are not within your goal range for:    - Pain    - Shortness of breath    - Bleeding (nose, urine, stool, wound)    - Feeling sick to your stomach and throwing up    - Mouth sores/pain in your mouth or throat    - Hard stool or very loose stools (increase in ostomy output)    - No urine for 12 hours    - Feeding tube or other catheter/tube issue    - Redness or pain at previous IV or port/catheter site    - Depressed or anxiety   - Swelling (leg, arm, abdomen,     face, neck)  - Skin rash or skin changes  - Wound issues (redness, drainage,    re-opened)  - Confusion  - Vision changes  - Fever >100.4 F or chills  - Worsening cough with mucus that is    green, yellow, or bloody  - Pain or burning when going to the    bathroom  -  Home Infusion Pump Issue- call    [phone]         Call your healthcare provider if you are going into the YELLOW ZONE     GREEN ZONE Means:  Your symptoms are under controls  Continue to take your medicine as ordered  Keep all visits to the provider GREEN ZONE: You are in control  No increase or worsening symptoms  Able to take your medicine  Able to drink and eat    - DO NOT use MyChart messages to report red or yellow symptoms. Allow up to 3    business days for a reply.  -MyChart is for non-urgent medication refills, scheduling requests, or other general questions.         [id number] Rev. 09/06/2021  Approved by Oncology Patient Education Committee

## 2022-04-10 ENCOUNTER — Telehealth
Admit: 2022-04-10 | Discharge: 2022-04-11 | Payer: PRIVATE HEALTH INSURANCE | Attending: Student in an Organized Health Care Education/Training Program | Primary: Student in an Organized Health Care Education/Training Program

## 2022-04-10 DIAGNOSIS — F321 Major depressive disorder, single episode, moderate: Principal | ICD-10-CM

## 2022-04-10 DIAGNOSIS — F411 Generalized anxiety disorder: Principal | ICD-10-CM

## 2022-04-10 MED ORDER — OLANZAPINE 7.5 MG TABLET
ORAL_TABLET | Freq: Every evening | ORAL | 3 refills | 30 days | Status: CP
Start: 2022-04-10 — End: 2022-08-08

## 2022-04-10 MED ORDER — MIRTAZAPINE 15 MG TABLET
ORAL_TABLET | Freq: Every evening | ORAL | 3 refills | 30 days | Status: CP
Start: 2022-04-10 — End: 2022-08-08

## 2022-04-10 MED ORDER — HYDROXYZINE HCL 50 MG TABLET
ORAL_TABLET | Freq: Two times a day (BID) | ORAL | 3 refills | 15 days | Status: CP | PRN
Start: 2022-04-10 — End: ?

## 2022-04-10 NOTE — Unmapped (Signed)
[facility]  Comprehensive Cancer Support Program/Psychiatry   Telehealth Established Patient E&M Service      Assessment:  [name] [name] presents for follow-up evaluation. Her presentation continues to be consistent with diagnosis/diagnoses of generalized anxiety disorder based on the presence of multiple worries that are difficult to control, sleep disturbance, fatigue/low energy, and impaired concentration/mind going blank. Of note, she does experience panic attacks but has not been found to meet full criteria for the disorder, though assessment is ongoing.  She also exhibits some compulsive behaviors around checking light switches and locks before leaving home, but she has not been found to meet clinical criteria for OCD.      Based on updated depression ROS conducted 10/36/34 she now also meets criteria for major depressive disorder given greater than two weeks of anhedonia, depressed mood, sleep disturbance, low energy, difficulty concentrating, and feelings of guilt.      Regarding treatment, before starting to work with [facility] psychiatry she had never tried medications that were specifically intended to target mood or anxiety, though she has taken a number of psychotropic medications for non-psych indications.  At time of intake she had been started on duloxetine by her pain management team, but she reported feeling depressed and with severe anxiety after starting it, and it was not felt to be a good option given absorption issues, so it was discontinued.  She was subsequently restarted on low-dose mirtazapine by other providers.  During at hospitalization this fall she was seen by myself and started on low dose olanzapine for anxiety, sleep, and appetite.  Olanzapine has subsequently been titrated to 7.5mg  qHS for anxiety, mood, and appetite.      Since this fall she has been dealing with out of control pain and has been hospitalized several times.  There has been growing concern amongst providers regarding her increasing symptoms of depression, and low dose Lexapro was started despite concerns that her ongoing weight loss would render this agent ineffective.  In any case, hospitalization was for GI bleeding, which when Lexapro was started had stopped, but it resumed after Lexapro was started, thus this agent was discontinued.     Today:  She is having a difficult week as her boyfriend broke up with her, officially as of Monday.  She does say her appetite is better and her anxiety is fairly well controlled with BID hydroxyzine which was started in the hospital. However her depressive symptoms remain persistent.  She was hesitant but ultimately amenable to a trial of higher dose mirtazapine.  In the past she has felt excessively sedated on doses higher than 7.5, but is willing to try 15mg  again.  The idea of referring her to interventional psychiatry was also proposed to her today given her medical complexities, but she is not interested in this option at this point.       I have reviewed this patient's records including medical and psychiatric history, imaging, medication history, and when applicable, the PDMP and summarized above.    Identifying Information:  [name] [name] is a 22 y.o. female with a history of [name] s/p colectomy,  DVT (RUE, s/p Eliquis x3 months), desmoid tumor, anemia, and severe protein-calorie malnutrition (most recent BMI 19.2) originally referred by [name] [name] for evaluation of anxiety and low mood.       Risk Assessment:  A full psychiatric risk assessment was conducted on 12/09/2021 and risks do not appear significantly changed from that visit.  While future psychiatric events cannot  be accurately predicted, the patient does not currently require acute inpatient psychiatric care and does not currently meet [facility] involuntary commitment criteria.      Plan:    Problem 1: Generalized Anxiety Disorder  Status of problem: improved or improving  Interventions:   - Continue Olanzapine 7.5mg  qHS for anxiety, low mood, insomnia, and reduced appetite  - INCREASE Mirtazapine to 15mg  qHS  - Continue Hydroxyzine 50mg  BID PRN for acute anxiety  - Continue psychotherapy with [name] [name]  - Would benefit from DBT skills in the future    Problem 2: Major Depressive Disorder  Status of problem: worsening  Interventions:   - Olanzapine as above  - Mirtazapine as above  - Psychotherapy as above    Follow-up/Leave Plans:  - [name] [name] 05/08/22 at 3:30pm (tentative)  - Myself: 07/24/22 at 2:30pm    Psychotherapy provided:  No billable psychotherapy service provided but brief supportive therapy was utilized.    Patient has been given this writer's contact information.  The patient has been instructed to call 911 for emergencies.    Patient and plan of care were discussed with the Attending MD, Dr. Horton [name]    Subjective:    Chief complaint:  Follow-up psychiatric evaluation for anxiety and low mood    Interval History:   Says she's been really sick since she's been home.  She and her boyfriend just broke up.  Says she is pretty heartbroken about the situation.  She says it kind of is what it is kind of situation.       Says her mood has been kind of avoiding my feelings.  Says she has just focused on medical stuff this week.  When asked about SI says I don't think so I've just been really struggling getting by day by day. Says she has a hard time getting up in the morning and functioning and doing what she needs to do.  Says she would like to stay asleep in the morning and not have to face the world, but no actual SI.  Never plan, intent, formal SI.      She says her hydroxyzine has been helping with her anxiety.  Is taking 50mg  about twice a day, sometimes a little less, sometimes a little more.      Says her pain has been up in the last few days because she has had GI bleeding again.  Overall things have gone well with the buprenorphine patch.  No withdrawal symptoms when started in the hospital.     Says she's been eating a little bit better, she isn't sure why but her appetite is coming back a little bit.      Objective:      Mental Status Exam:  Appearance:    Appears stated age and Clean/Neat   Motor:   No abnormal movements   Speech/Language:     Softer volume, otherwise normal amount, prosody   Mood:   Kind of avoiding my feelings   Affect:   Depressed   Thought process and Associations:   Logical, linear, clear, coherent, goal directed   Abnormal/psychotic thought content:     Denies SI, HI, self harm, delusions, obsessions, paranoid ideation, or ideas of reference   Perceptual disturbances:     Denies auditory and visual hallucinations, behavior not concerning for response to internal stimuli     Other:   none   PE:  The patient sits comfortably in view of the camera.  The patient's  breathing is observed to be comfortable and normal.  The patient is in no acute distress.  All extremity movements appear intact.  There are no focal neurological deficits observed.       The patient reports they are physically located in [place] and is currently: at home. I conducted a audio/video visit. I spent  42m 20s on the video call with the patient. I spent an additional 15 minutes on pre- and post-visit activities on the date of service .      [name], MD  04/10/2022

## 2022-04-11 NOTE — Unmapped (Signed)
Fertility Preservation Consult Attempt     I called Arwa E. Gorgas to discuss fertility preservation. There was no answer, so I left a voicemail with my direct contact information. I encouraged the patient to contact me if interested in learning about fertility preservation.      Thank you for the consultation request. I am happy to connect with this patient at their earliest convenience. Please feel free to contact me with any questions.      [place] Lodato Hunt, MSW, LCSW  [facility] Fertility Preservation Clinical Coordinator   Pager: [phone]  Phone: [phone]

## 2022-04-11 NOTE — Unmapped (Signed)
Adolescent and Young Adult Cancer Program Visit     Service date: April 08, 2022    Encounter location: Multidisciplinary Clinic    Clinician: [name], LCSW - AYA Clinical Social Worker    Patient identifiers: [name] [name] is a 22 y.o. with desmoid tumors and [name]. [name] [name] uses she/her pronouns.     Additional visit participants: pt's mom, Dr. [name]    Visit Summary     Met with the patient for evaluation of needs that may be addressed by [facility]    Joined for part of her visit with Dr. [name]. [name] [name] plans to start nirogacestat. We discussed fertility preservation; she plans to start Lupron, but is also interested in learning more about egg cryopreservation. Re-referred to [place]. [name] [name] agreed to return Jordan's call if she misses her.    Spent time one on one with [name] [name] following visit for brief supportive counseling. Unfortunately, her relationship with her boyfriend Harrold Donath ended yesterday. She shared some about this and her coping, and shared that she is getting good support from her friends. She is understandably generally overwhelmed by all of her medical concerns as well as multiple social stressors. Provided support and encouragement.    Follow Up/Plan:     Will see again at upcoming visits and remain available for virtual counseling as needed.    I will continue to follow this patient for AYA-appropriate support. They have my contact information and have been encouraged to contact me as needed.     Flowsheet     AYA Assessment  Primary Caregiver: Parent  Location Seen: Clinic  Contact Point: During treatment  Referral Source: AYA team  Issues Discussed: Treatment information, Mental Health, Fertility preservation, Other (Comment) (Relationship challenges)  AYA Team Interventions: Supportive counseling, Preliminary fertility preservation education / coordination  Referrals Made: Fertility Preservation  Follow Up: As needed  Time Spent (in minutes): 35    04/11/2022     [name], LCSW  Adolescent and Young Adult Clinical Social Worker  Phone: [phone]  Email: [email or web address]

## 2022-04-12 DIAGNOSIS — R112 Nausea with vomiting, unspecified: Secondary | ICD-10-CM | POA: Diagnosis not present

## 2022-04-12 DIAGNOSIS — E860 Dehydration: Secondary | ICD-10-CM | POA: Diagnosis not present

## 2022-04-12 DIAGNOSIS — R1084 Generalized abdominal pain: Secondary | ICD-10-CM | POA: Diagnosis not present

## 2022-04-12 DIAGNOSIS — Z932 Ileostomy status: Secondary | ICD-10-CM | POA: Diagnosis not present

## 2022-04-12 NOTE — Unmapped (Signed)
Fertility Preservation Consult Attempt     I called Mikaela E. Elsberry to discuss fertility preservation. There was no answer and no option to leave a voicemail. I will plan to try her again next week.     Thank you for the consultation request. I am happy to connect with this patient at their earliest convenience. Please feel free to contact me with any questions.      [place] Lodato Hunt, MSW, LCSW  [facility] Fertility Preservation Clinical Coordinator   Pager: [phone]  Phone: [phone]

## 2022-04-13 DIAGNOSIS — E860 Dehydration: Secondary | ICD-10-CM | POA: Diagnosis not present

## 2022-04-13 DIAGNOSIS — Z932 Ileostomy status: Secondary | ICD-10-CM | POA: Diagnosis not present

## 2022-04-13 DIAGNOSIS — R112 Nausea with vomiting, unspecified: Secondary | ICD-10-CM | POA: Diagnosis not present

## 2022-04-13 DIAGNOSIS — R1084 Generalized abdominal pain: Secondary | ICD-10-CM | POA: Diagnosis not present

## 2022-04-14 DIAGNOSIS — D500 Iron deficiency anemia secondary to blood loss (chronic): Principal | ICD-10-CM

## 2022-04-14 DIAGNOSIS — T7840XA Allergy, unspecified, initial encounter: Principal | ICD-10-CM

## 2022-04-14 DIAGNOSIS — R1084 Generalized abdominal pain: Secondary | ICD-10-CM | POA: Diagnosis not present

## 2022-04-14 DIAGNOSIS — E860 Dehydration: Secondary | ICD-10-CM | POA: Diagnosis not present

## 2022-04-14 DIAGNOSIS — Z932 Ileostomy status: Secondary | ICD-10-CM | POA: Diagnosis not present

## 2022-04-14 DIAGNOSIS — R112 Nausea with vomiting, unspecified: Secondary | ICD-10-CM | POA: Diagnosis not present

## 2022-04-15 DIAGNOSIS — Z932 Ileostomy status: Secondary | ICD-10-CM | POA: Diagnosis not present

## 2022-04-15 DIAGNOSIS — R1084 Generalized abdominal pain: Secondary | ICD-10-CM | POA: Diagnosis not present

## 2022-04-15 DIAGNOSIS — E860 Dehydration: Secondary | ICD-10-CM | POA: Diagnosis not present

## 2022-04-15 DIAGNOSIS — R112 Nausea with vomiting, unspecified: Secondary | ICD-10-CM | POA: Diagnosis not present

## 2022-04-16 NOTE — Unmapped (Signed)
Specialty Medication(s): [name] [name] (not onboarded)    [name] [name] has been dis-enrolled from the [facility] specialty pharmacy services due to  prescription discontinued.  Clinic will route script once patient ready to initiate therapy .    Additional information provided to the patient: NA    Kermit Balo, [facility]  [facility] Specialty Pharmacist

## 2022-04-17 NOTE — Unmapped (Signed)
Comprehensive Cancer Support Program (CC[facility]) - Psychiatry Outpatient Clinic     After Visit Summary  It was a pleasure to see you today in the [facility] Comprehensive Cancer Support Program ([facility]) psycho-oncology clinic. The [facility] is a multidisciplinary program dedicated to helping patients, caregivers, and families with cancer treatment, recovery and survivorship.      For clinical issues, including medication refills, and appointment scheduling:  Please call my direct office line at [phone].  Once I am on maternity leave, you can call Dr. Senaida Ores at [phone].   You can also send either of [name] a MyChart message.    Please allow at least 24 hours for medication refills (during business hours, M-F). We are generally unable to accommodate same-day requests for refills.     If you are taking any controlled substances (such as anxiety or sleep medications), you must use them as the directions say to use them. We generally do not provide early refills over the phone without clear reason, and it would be inappropriate to obtain the medications from other doctors. We routinely use the [place] controlled substance database to monitor prescription drug use.     For urgent issues occurring after hours, on the weekend, or during holidays, please call the Psychiatry Department Outpatient Clinic at [phone]    If you experience suicidal thoughts, please call [phone] or present to your closest emergency room.      To learn more about the CCSP  CCSP Website:  [email or web address]  Call or stop by the Patient and Family Resource Center  Location: Ground floor, [facility]  Phone: [phone]

## 2022-04-19 DIAGNOSIS — E860 Dehydration: Secondary | ICD-10-CM | POA: Diagnosis not present

## 2022-04-19 DIAGNOSIS — R1084 Generalized abdominal pain: Secondary | ICD-10-CM | POA: Diagnosis not present

## 2022-04-19 DIAGNOSIS — Z932 Ileostomy status: Secondary | ICD-10-CM | POA: Diagnosis not present

## 2022-04-19 DIAGNOSIS — R112 Nausea with vomiting, unspecified: Secondary | ICD-10-CM | POA: Diagnosis not present

## 2022-04-22 ENCOUNTER — Ambulatory Visit: Admit: 2022-04-22 | Discharge: 2022-04-23 | Payer: PRIVATE HEALTH INSURANCE

## 2022-04-22 DIAGNOSIS — D485 Neoplasm of uncertain behavior of skin: Secondary | ICD-10-CM | POA: Diagnosis not present

## 2022-04-22 DIAGNOSIS — X58XXXA Exposure to other specified factors, initial encounter: Secondary | ICD-10-CM | POA: Diagnosis not present

## 2022-04-22 DIAGNOSIS — E860 Dehydration: Secondary | ICD-10-CM | POA: Diagnosis not present

## 2022-04-22 DIAGNOSIS — R1084 Generalized abdominal pain: Secondary | ICD-10-CM | POA: Diagnosis not present

## 2022-04-22 DIAGNOSIS — R609 Edema, unspecified: Secondary | ICD-10-CM | POA: Diagnosis not present

## 2022-04-22 DIAGNOSIS — T7840XA Allergy, unspecified, initial encounter: Secondary | ICD-10-CM | POA: Diagnosis not present

## 2022-04-22 DIAGNOSIS — Z932 Ileostomy status: Secondary | ICD-10-CM | POA: Diagnosis not present

## 2022-04-22 DIAGNOSIS — R600 Localized edema: Secondary | ICD-10-CM | POA: Diagnosis not present

## 2022-04-22 DIAGNOSIS — R112 Nausea with vomiting, unspecified: Secondary | ICD-10-CM | POA: Diagnosis not present

## 2022-04-22 DIAGNOSIS — D500 Iron deficiency anemia secondary to blood loss (chronic): Secondary | ICD-10-CM | POA: Diagnosis not present

## 2022-04-23 ENCOUNTER — Ambulatory Visit
Admit: 2022-04-23 | Discharge: 2022-04-24 | Payer: PRIVATE HEALTH INSURANCE | Attending: Student in an Organized Health Care Education/Training Program | Primary: Student in an Organized Health Care Education/Training Program

## 2022-04-23 DIAGNOSIS — R1030 Lower abdominal pain, unspecified: Principal | ICD-10-CM

## 2022-04-23 DIAGNOSIS — G894 Chronic pain syndrome: Principal | ICD-10-CM

## 2022-04-23 DIAGNOSIS — D48119 Desmoid tumor of unspecified site: Secondary | ICD-10-CM | POA: Diagnosis not present

## 2022-04-23 DIAGNOSIS — R208 Other disturbances of skin sensation: Principal | ICD-10-CM

## 2022-04-23 DIAGNOSIS — G893 Neoplasm related pain (acute) (chronic): Principal | ICD-10-CM

## 2022-04-23 DIAGNOSIS — R1084 Generalized abdominal pain: Principal | ICD-10-CM

## 2022-04-23 DIAGNOSIS — M7918 Myalgia, other site: Principal | ICD-10-CM

## 2022-04-23 DIAGNOSIS — Z930 Tracheostomy status: Secondary | ICD-10-CM | POA: Diagnosis not present

## 2022-04-23 DIAGNOSIS — R112 Nausea with vomiting, unspecified: Secondary | ICD-10-CM | POA: Diagnosis not present

## 2022-04-23 DIAGNOSIS — E860 Dehydration: Secondary | ICD-10-CM | POA: Diagnosis not present

## 2022-04-23 MED ORDER — FUROSEMIDE 20 MG TABLET
ORAL_TABLET | Freq: Every day | ORAL | 0 refills | 10.00000 days | Status: CP
Start: 2022-04-23 — End: 2022-05-03

## 2022-04-23 MED ORDER — BUPRENORPHINE 15 MCG/HOUR WEEKLY TRANSDERMAL PATCH
MEDICATED_PATCH | TRANSDERMAL | 2 refills | 28 days | Status: CP
Start: 2022-04-23 — End: ?
  Filled 2022-06-20: qty 2, 14d supply, fill #0

## 2022-04-25 ENCOUNTER — Ambulatory Visit: Admit: 2022-04-25 | Discharge: 2022-04-26 | Payer: PRIVATE HEALTH INSURANCE

## 2022-04-25 DIAGNOSIS — Z91048 Other nonmedicinal substance allergy status: Secondary | ICD-10-CM | POA: Diagnosis not present

## 2022-04-25 DIAGNOSIS — Z91040 Latex allergy status: Secondary | ICD-10-CM | POA: Diagnosis not present

## 2022-04-25 DIAGNOSIS — D500 Iron deficiency anemia secondary to blood loss (chronic): Secondary | ICD-10-CM | POA: Diagnosis not present

## 2022-04-25 DIAGNOSIS — Z91018 Allergy to other foods: Secondary | ICD-10-CM | POA: Diagnosis not present

## 2022-04-25 DIAGNOSIS — Z881 Allergy status to other antibiotic agents status: Secondary | ICD-10-CM | POA: Diagnosis not present

## 2022-04-25 DIAGNOSIS — Z885 Allergy status to narcotic agent status: Secondary | ICD-10-CM | POA: Diagnosis not present

## 2022-04-25 DIAGNOSIS — Z886 Allergy status to analgesic agent status: Secondary | ICD-10-CM | POA: Diagnosis not present

## 2022-04-25 DIAGNOSIS — L299 Pruritus, unspecified: Secondary | ICD-10-CM | POA: Diagnosis not present

## 2022-04-25 DIAGNOSIS — Z888 Allergy status to other drugs, medicaments and biological substances status: Secondary | ICD-10-CM | POA: Diagnosis not present

## 2022-04-25 DIAGNOSIS — Z880 Allergy status to penicillin: Secondary | ICD-10-CM | POA: Diagnosis not present

## 2022-04-25 DIAGNOSIS — T7840XA Allergy, unspecified, initial encounter: Secondary | ICD-10-CM | POA: Diagnosis not present

## 2022-04-25 DIAGNOSIS — X58XXXA Exposure to other specified factors, initial encounter: Secondary | ICD-10-CM | POA: Diagnosis not present

## 2022-04-25 MED ORDER — HYDROXYZINE HCL 50 MG TABLET
ORAL_TABLET | Freq: Two times a day (BID) | ORAL | 1 refills | 90 days | Status: CP | PRN
Start: 2022-04-25 — End: ?

## 2022-04-26 DIAGNOSIS — Z932 Ileostomy status: Secondary | ICD-10-CM | POA: Diagnosis not present

## 2022-04-26 DIAGNOSIS — E860 Dehydration: Secondary | ICD-10-CM | POA: Diagnosis not present

## 2022-04-26 DIAGNOSIS — R1084 Generalized abdominal pain: Secondary | ICD-10-CM | POA: Diagnosis not present

## 2022-04-26 DIAGNOSIS — R112 Nausea with vomiting, unspecified: Secondary | ICD-10-CM | POA: Diagnosis not present

## 2022-04-27 ENCOUNTER — Ambulatory Visit: Admit: 2022-04-27 | Discharge: 2022-04-28 | Payer: PRIVATE HEALTH INSURANCE

## 2022-04-27 DIAGNOSIS — T7840XA Allergy, unspecified, initial encounter: Secondary | ICD-10-CM | POA: Diagnosis not present

## 2022-04-27 DIAGNOSIS — D500 Iron deficiency anemia secondary to blood loss (chronic): Secondary | ICD-10-CM | POA: Diagnosis not present

## 2022-04-29 DIAGNOSIS — D48119 Desmoid tumor: Principal | ICD-10-CM

## 2022-04-29 DIAGNOSIS — Z932 Ileostomy status: Secondary | ICD-10-CM | POA: Diagnosis not present

## 2022-04-29 DIAGNOSIS — R112 Nausea with vomiting, unspecified: Secondary | ICD-10-CM | POA: Diagnosis not present

## 2022-04-29 DIAGNOSIS — R1084 Generalized abdominal pain: Secondary | ICD-10-CM | POA: Diagnosis not present

## 2022-04-29 DIAGNOSIS — E860 Dehydration: Secondary | ICD-10-CM | POA: Diagnosis not present

## 2022-05-01 ENCOUNTER — Telehealth
Admit: 2022-05-01 | Discharge: 2022-05-02 | Payer: PRIVATE HEALTH INSURANCE | Attending: Gastroenterology | Primary: Gastroenterology

## 2022-05-01 DIAGNOSIS — D1391 FAP (familial adenomatous polyposis): Principal | ICD-10-CM

## 2022-05-01 DIAGNOSIS — F419 Anxiety disorder, unspecified: Principal | ICD-10-CM

## 2022-05-01 DIAGNOSIS — E611 Iron deficiency: Principal | ICD-10-CM

## 2022-05-01 DIAGNOSIS — R109 Unspecified abdominal pain: Principal | ICD-10-CM

## 2022-05-01 DIAGNOSIS — K91850 Pouchitis: Principal | ICD-10-CM

## 2022-05-01 DIAGNOSIS — G8929 Other chronic pain: Principal | ICD-10-CM

## 2022-05-01 DIAGNOSIS — Z789 Other specified health status: Principal | ICD-10-CM

## 2022-05-01 DIAGNOSIS — D485 Neoplasm of uncertain behavior of skin: Principal | ICD-10-CM

## 2022-05-01 DIAGNOSIS — R197 Diarrhea, unspecified: Principal | ICD-10-CM

## 2022-05-01 MED ORDER — CEFDINIR 300 MG CAPSULE
ORAL_CAPSULE | Freq: Two times a day (BID) | ORAL | 3 refills | 90 days | Status: CP
Start: 2022-05-01 — End: 2023-05-01

## 2022-05-01 MED ORDER — MESALAMINE 1,000 MG RECTAL SUPPOSITORY
Freq: Every evening | RECTAL | 0 refills | 30 days | Status: CP
Start: 2022-05-01 — End: 2022-05-31

## 2022-05-02 ENCOUNTER — Ambulatory Visit: Admit: 2022-05-02 | Discharge: 2022-05-02 | Payer: PRIVATE HEALTH INSURANCE

## 2022-05-02 DIAGNOSIS — F410 Panic disorder [episodic paroxysmal anxiety] without agoraphobia: Principal | ICD-10-CM

## 2022-05-02 DIAGNOSIS — I82A11 Acute embolism and thrombosis of right axillary vein: Principal | ICD-10-CM

## 2022-05-02 DIAGNOSIS — K638219 Small intestinal bacterial overgrowth (SIBO): Principal | ICD-10-CM

## 2022-05-02 DIAGNOSIS — F411 Generalized anxiety disorder: Principal | ICD-10-CM

## 2022-05-02 DIAGNOSIS — K922 Gastrointestinal hemorrhage, unspecified: Principal | ICD-10-CM

## 2022-05-02 DIAGNOSIS — F331 Major depressive disorder, recurrent, moderate: Principal | ICD-10-CM

## 2022-05-02 DIAGNOSIS — Q8789 Other specified congenital malformation syndromes, not elsewhere classified: Principal | ICD-10-CM

## 2022-05-02 DIAGNOSIS — K567 Ileus, unspecified: Principal | ICD-10-CM

## 2022-05-02 DIAGNOSIS — D48119 Desmoid tumor of unspecified site: Secondary | ICD-10-CM | POA: Diagnosis not present

## 2022-05-02 DIAGNOSIS — D500 Iron deficiency anemia secondary to blood loss (chronic): Principal | ICD-10-CM

## 2022-05-02 DIAGNOSIS — E43 Unspecified severe protein-calorie malnutrition: Principal | ICD-10-CM

## 2022-05-02 DIAGNOSIS — K91850 Pouchitis: Principal | ICD-10-CM

## 2022-05-02 DIAGNOSIS — D509 Iron deficiency anemia, unspecified: Secondary | ICD-10-CM | POA: Diagnosis not present

## 2022-05-02 DIAGNOSIS — F329 Major depressive disorder, single episode, unspecified: Secondary | ICD-10-CM | POA: Diagnosis not present

## 2022-05-02 MED ORDER — TRANEXAMIC ACID 650 MG TABLET
ORAL_TABLET | Freq: Three times a day (TID) | ORAL | 0 refills | 30 days | Status: CP
Start: 2022-05-02 — End: 2022-06-01

## 2022-05-03 DIAGNOSIS — R112 Nausea with vomiting, unspecified: Secondary | ICD-10-CM | POA: Diagnosis not present

## 2022-05-03 DIAGNOSIS — E860 Dehydration: Secondary | ICD-10-CM | POA: Diagnosis not present

## 2022-05-03 DIAGNOSIS — R1084 Generalized abdominal pain: Secondary | ICD-10-CM | POA: Diagnosis not present

## 2022-05-03 DIAGNOSIS — Z932 Ileostomy status: Secondary | ICD-10-CM | POA: Diagnosis not present

## 2022-05-07 ENCOUNTER — Ambulatory Visit: Admit: 2022-05-07 | Discharge: 2022-05-08 | Payer: PRIVATE HEALTH INSURANCE

## 2022-05-07 DIAGNOSIS — D48119 Desmoid tumor of unspecified site: Secondary | ICD-10-CM | POA: Diagnosis not present

## 2022-05-07 DIAGNOSIS — R1084 Generalized abdominal pain: Secondary | ICD-10-CM | POA: Diagnosis not present

## 2022-05-08 ENCOUNTER — Telehealth
Admit: 2022-05-08 | Discharge: 2022-05-09 | Payer: PRIVATE HEALTH INSURANCE | Attending: Psychiatry | Primary: Psychiatry

## 2022-05-08 DIAGNOSIS — F411 Generalized anxiety disorder: Principal | ICD-10-CM

## 2022-05-08 DIAGNOSIS — R1084 Generalized abdominal pain: Secondary | ICD-10-CM | POA: Diagnosis not present

## 2022-05-08 DIAGNOSIS — E860 Dehydration: Secondary | ICD-10-CM | POA: Diagnosis not present

## 2022-05-08 DIAGNOSIS — R112 Nausea with vomiting, unspecified: Secondary | ICD-10-CM | POA: Diagnosis not present

## 2022-05-08 DIAGNOSIS — Z932 Ileostomy status: Secondary | ICD-10-CM | POA: Diagnosis not present

## 2022-05-11 DIAGNOSIS — T7840XA Allergy, unspecified, initial encounter: Principal | ICD-10-CM

## 2022-05-11 DIAGNOSIS — D500 Iron deficiency anemia secondary to blood loss (chronic): Principal | ICD-10-CM

## 2022-05-11 DIAGNOSIS — R1084 Generalized abdominal pain: Secondary | ICD-10-CM | POA: Diagnosis not present

## 2022-05-11 DIAGNOSIS — E860 Dehydration: Secondary | ICD-10-CM | POA: Diagnosis not present

## 2022-05-11 DIAGNOSIS — R112 Nausea with vomiting, unspecified: Secondary | ICD-10-CM | POA: Diagnosis not present

## 2022-05-11 DIAGNOSIS — Z932 Ileostomy status: Secondary | ICD-10-CM | POA: Diagnosis not present

## 2022-05-12 DIAGNOSIS — Z932 Ileostomy status: Secondary | ICD-10-CM | POA: Diagnosis not present

## 2022-05-12 DIAGNOSIS — R112 Nausea with vomiting, unspecified: Secondary | ICD-10-CM | POA: Diagnosis not present

## 2022-05-12 DIAGNOSIS — R1084 Generalized abdominal pain: Secondary | ICD-10-CM | POA: Diagnosis not present

## 2022-05-12 DIAGNOSIS — E860 Dehydration: Secondary | ICD-10-CM | POA: Diagnosis not present

## 2022-05-14 DIAGNOSIS — Z932 Ileostomy status: Secondary | ICD-10-CM | POA: Diagnosis not present

## 2022-05-14 DIAGNOSIS — R112 Nausea with vomiting, unspecified: Secondary | ICD-10-CM | POA: Diagnosis not present

## 2022-05-14 DIAGNOSIS — R1084 Generalized abdominal pain: Secondary | ICD-10-CM | POA: Diagnosis not present

## 2022-05-14 DIAGNOSIS — E860 Dehydration: Secondary | ICD-10-CM | POA: Diagnosis not present

## 2022-05-14 MED ORDER — OXYCODONE 10 MG TABLET
ORAL_TABLET | Freq: Four times a day (QID) | ORAL | 0 refills | 30 days | Status: CP | PRN
Start: 2022-05-14 — End: ?

## 2022-05-17 DIAGNOSIS — R1084 Generalized abdominal pain: Secondary | ICD-10-CM | POA: Diagnosis not present

## 2022-05-17 DIAGNOSIS — R112 Nausea with vomiting, unspecified: Secondary | ICD-10-CM | POA: Diagnosis not present

## 2022-05-17 DIAGNOSIS — E860 Dehydration: Secondary | ICD-10-CM | POA: Diagnosis not present

## 2022-05-17 DIAGNOSIS — Z932 Ileostomy status: Secondary | ICD-10-CM | POA: Diagnosis not present

## 2022-05-20 DIAGNOSIS — R1084 Generalized abdominal pain: Secondary | ICD-10-CM | POA: Diagnosis not present

## 2022-05-20 DIAGNOSIS — E860 Dehydration: Secondary | ICD-10-CM | POA: Diagnosis not present

## 2022-05-20 DIAGNOSIS — R112 Nausea with vomiting, unspecified: Secondary | ICD-10-CM | POA: Diagnosis not present

## 2022-05-20 DIAGNOSIS — Z932 Ileostomy status: Secondary | ICD-10-CM | POA: Diagnosis not present

## 2022-05-23 MED ORDER — MESALAMINE 1,000 MG RECTAL SUPPOSITORY
Freq: Every evening | RECTAL | 0 refills | 90 days | Status: CP
Start: 2022-05-23 — End: 2022-08-21

## 2022-05-24 DIAGNOSIS — R1084 Generalized abdominal pain: Secondary | ICD-10-CM | POA: Diagnosis not present

## 2022-05-27 DIAGNOSIS — R112 Nausea with vomiting, unspecified: Secondary | ICD-10-CM | POA: Diagnosis not present

## 2022-05-27 DIAGNOSIS — E860 Dehydration: Secondary | ICD-10-CM | POA: Diagnosis not present

## 2022-05-27 DIAGNOSIS — R1084 Generalized abdominal pain: Secondary | ICD-10-CM | POA: Diagnosis not present

## 2022-05-27 DIAGNOSIS — Z932 Ileostomy status: Secondary | ICD-10-CM | POA: Diagnosis not present

## 2022-05-28 DIAGNOSIS — R1084 Generalized abdominal pain: Secondary | ICD-10-CM | POA: Diagnosis not present

## 2022-05-29 ENCOUNTER — Telehealth
Admit: 2022-05-29 | Discharge: 2022-05-30 | Payer: PRIVATE HEALTH INSURANCE | Attending: Gastroenterology | Primary: Gastroenterology

## 2022-05-29 DIAGNOSIS — D509 Iron deficiency anemia, unspecified: Principal | ICD-10-CM

## 2022-05-29 DIAGNOSIS — D1391 FAP (familial adenomatous polyposis): Principal | ICD-10-CM

## 2022-05-29 DIAGNOSIS — K91850 Pouchitis: Principal | ICD-10-CM

## 2022-05-29 DIAGNOSIS — G8929 Other chronic pain: Principal | ICD-10-CM

## 2022-05-29 DIAGNOSIS — E611 Iron deficiency: Principal | ICD-10-CM

## 2022-05-29 DIAGNOSIS — Z789 Other specified health status: Principal | ICD-10-CM

## 2022-05-29 DIAGNOSIS — F419 Anxiety disorder, unspecified: Principal | ICD-10-CM

## 2022-05-29 DIAGNOSIS — R109 Unspecified abdominal pain: Principal | ICD-10-CM

## 2022-05-29 DIAGNOSIS — D485 Neoplasm of uncertain behavior of skin: Principal | ICD-10-CM

## 2022-05-31 DIAGNOSIS — R1084 Generalized abdominal pain: Secondary | ICD-10-CM | POA: Diagnosis not present

## 2022-06-02 DIAGNOSIS — D500 Iron deficiency anemia secondary to blood loss (chronic): Principal | ICD-10-CM

## 2022-06-03 DIAGNOSIS — R112 Nausea with vomiting, unspecified: Secondary | ICD-10-CM | POA: Diagnosis not present

## 2022-06-03 DIAGNOSIS — R1084 Generalized abdominal pain: Secondary | ICD-10-CM | POA: Diagnosis not present

## 2022-06-03 DIAGNOSIS — Z932 Ileostomy status: Secondary | ICD-10-CM | POA: Diagnosis not present

## 2022-06-03 DIAGNOSIS — E860 Dehydration: Secondary | ICD-10-CM | POA: Diagnosis not present

## 2022-06-04 MED ORDER — METHYLPREDNISOLONE 32 MG TABLET
ORAL_TABLET | 0 refills | 0 days | Status: CP
Start: 2022-06-04 — End: ?

## 2022-06-05 ENCOUNTER — Telehealth
Admit: 2022-06-05 | Discharge: 2022-06-06 | Payer: PRIVATE HEALTH INSURANCE | Attending: Psychiatry | Primary: Psychiatry

## 2022-06-05 DIAGNOSIS — Z79899 Other long term (current) drug therapy: Principal | ICD-10-CM

## 2022-06-05 DIAGNOSIS — Z0189 Encounter for other specified special examinations: Principal | ICD-10-CM

## 2022-06-05 DIAGNOSIS — K922 Gastrointestinal hemorrhage, unspecified: Principal | ICD-10-CM

## 2022-06-06 ENCOUNTER — Ambulatory Visit: Admit: 2022-06-06 | Payer: PRIVATE HEALTH INSURANCE

## 2022-06-06 ENCOUNTER — Other Ambulatory Visit: Admit: 2022-06-06 | Payer: PRIVATE HEALTH INSURANCE

## 2022-06-06 ENCOUNTER — Encounter: Admit: 2022-06-06 | Payer: PRIVATE HEALTH INSURANCE

## 2022-06-06 ENCOUNTER — Ambulatory Visit
Admit: 2022-06-06 | Discharge: 2022-06-20 | Disposition: A | Discharge status: Home Health | Payer: PRIVATE HEALTH INSURANCE

## 2022-06-06 DIAGNOSIS — Q8789 Other specified congenital malformation syndromes, not elsewhere classified: Principal | ICD-10-CM

## 2022-06-06 DIAGNOSIS — I82A11 Acute embolism and thrombosis of right axillary vein: Principal | ICD-10-CM

## 2022-06-06 DIAGNOSIS — K567 Ileus, unspecified: Principal | ICD-10-CM

## 2022-06-06 DIAGNOSIS — K638219 Small intestinal bacterial overgrowth, unspecified: Secondary | ICD-10-CM | POA: Diagnosis not present

## 2022-06-06 DIAGNOSIS — K56609 Unspecified intestinal obstruction, unspecified as to partial versus complete obstruction: Principal | ICD-10-CM

## 2022-06-06 DIAGNOSIS — D500 Iron deficiency anemia secondary to blood loss (chronic): Principal | ICD-10-CM

## 2022-06-06 DIAGNOSIS — R52 Pain, unspecified: Principal | ICD-10-CM

## 2022-06-06 DIAGNOSIS — D48119 Desmoid tumor of unspecified site: Secondary | ICD-10-CM | POA: Diagnosis not present

## 2022-06-06 DIAGNOSIS — E43 Unspecified severe protein-calorie malnutrition: Principal | ICD-10-CM

## 2022-06-06 DIAGNOSIS — F331 Major depressive disorder, recurrent, moderate: Principal | ICD-10-CM

## 2022-06-06 DIAGNOSIS — E860 Dehydration: Principal | ICD-10-CM

## 2022-06-06 DIAGNOSIS — R1011 Right upper quadrant pain: Secondary | ICD-10-CM | POA: Diagnosis not present

## 2022-06-06 DIAGNOSIS — E8770 Fluid overload, unspecified: Secondary | ICD-10-CM | POA: Diagnosis not present

## 2022-06-06 DIAGNOSIS — G894 Chronic pain syndrome: Secondary | ICD-10-CM | POA: Diagnosis not present

## 2022-06-06 DIAGNOSIS — Z79899 Other long term (current) drug therapy: Secondary | ICD-10-CM | POA: Diagnosis not present

## 2022-06-06 DIAGNOSIS — T454X5A Adverse effect of iron and its compounds, initial encounter: Secondary | ICD-10-CM | POA: Diagnosis not present

## 2022-06-06 DIAGNOSIS — F411 Generalized anxiety disorder: Secondary | ICD-10-CM | POA: Diagnosis not present

## 2022-06-06 DIAGNOSIS — G893 Neoplasm related pain (acute) (chronic): Secondary | ICD-10-CM | POA: Diagnosis not present

## 2022-06-06 DIAGNOSIS — R7401 Elevation of levels of liver transaminase levels: Secondary | ICD-10-CM | POA: Diagnosis not present

## 2022-06-06 DIAGNOSIS — Z86718 Personal history of other venous thrombosis and embolism: Secondary | ICD-10-CM | POA: Diagnosis not present

## 2022-06-06 DIAGNOSIS — D126 Benign neoplasm of colon, unspecified: Secondary | ICD-10-CM | POA: Diagnosis not present

## 2022-06-06 DIAGNOSIS — E781 Pure hyperglyceridemia: Secondary | ICD-10-CM | POA: Diagnosis not present

## 2022-06-06 DIAGNOSIS — L298 Other pruritus: Secondary | ICD-10-CM | POA: Diagnosis not present

## 2022-06-06 DIAGNOSIS — Z452 Encounter for adjustment and management of vascular access device: Secondary | ICD-10-CM | POA: Diagnosis not present

## 2022-06-06 DIAGNOSIS — R1084 Generalized abdominal pain: Secondary | ICD-10-CM | POA: Diagnosis not present

## 2022-06-06 DIAGNOSIS — K922 Gastrointestinal hemorrhage, unspecified: Secondary | ICD-10-CM | POA: Diagnosis not present

## 2022-06-06 DIAGNOSIS — G8929 Other chronic pain: Secondary | ICD-10-CM | POA: Diagnosis not present

## 2022-06-06 DIAGNOSIS — D509 Iron deficiency anemia, unspecified: Secondary | ICD-10-CM | POA: Diagnosis not present

## 2022-06-06 DIAGNOSIS — Z8616 Personal history of COVID-19: Secondary | ICD-10-CM | POA: Diagnosis not present

## 2022-06-06 DIAGNOSIS — R932 Abnormal findings on diagnostic imaging of liver and biliary tract: Secondary | ICD-10-CM | POA: Diagnosis not present

## 2022-06-06 DIAGNOSIS — Z0189 Encounter for other specified special examinations: Secondary | ICD-10-CM | POA: Diagnosis not present

## 2022-06-06 DIAGNOSIS — Z79891 Long term (current) use of opiate analgesic: Secondary | ICD-10-CM | POA: Diagnosis not present

## 2022-06-06 DIAGNOSIS — F410 Panic disorder [episodic paroxysmal anxiety] without agoraphobia: Secondary | ICD-10-CM | POA: Diagnosis not present

## 2022-06-06 DIAGNOSIS — R109 Unspecified abdominal pain: Secondary | ICD-10-CM | POA: Diagnosis not present

## 2022-06-06 DIAGNOSIS — R1031 Right lower quadrant pain: Secondary | ICD-10-CM | POA: Diagnosis not present

## 2022-06-20 MED ORDER — OXYCODONE 15 MG TABLET
ORAL_TABLET | ORAL | 0 refills | 10 days | Status: CP | PRN
Start: 2022-06-20 — End: 2022-06-30
  Filled 2022-06-20: qty 60, 10d supply, fill #0

## 2022-06-20 MED ORDER — GABAPENTIN 100 MG CAPSULE
ORAL_CAPSULE | Freq: Every evening | ORAL | 0 refills | 30 days | Status: CP
Start: 2022-06-20 — End: ?
  Filled 2022-06-20: qty 60, 30d supply, fill #0

## 2022-06-23 ENCOUNTER — Telehealth: Payer: Self-pay

## 2022-06-23 NOTE — Transitions of Care (Post Inpatient/ED Visit) (Signed)
   06/23/2022  Name: Lisa Donovan MRN: [id number] DOB: 09-26-2000  Today's TOC FU Call Status: Today's TOC FU Call Status:: Unsuccessul Call (1st Attempt) Unsuccessful Call (1st Attempt) Date: 06/23/22  Attempted to reach the patient regarding the most recent Inpatient/ED visit.  Follow Up Plan: Additional outreach attempts will be made to reach the patient to complete the Transitions of Care (Post Inpatient/ED visit) call.      Antionette Fairy, RN,BSN,CCM [facility] Health/[facility] Care Management Care Management Community Coordinator Direct Phone: [phone] Toll Free: [phone] Fax: [phone]

## 2022-06-24 ENCOUNTER — Telehealth: Payer: Self-pay

## 2022-06-24 DIAGNOSIS — R112 Nausea with vomiting, unspecified: Secondary | ICD-10-CM | POA: Diagnosis not present

## 2022-06-24 DIAGNOSIS — E860 Dehydration: Secondary | ICD-10-CM | POA: Diagnosis not present

## 2022-06-24 DIAGNOSIS — R1084 Generalized abdominal pain: Secondary | ICD-10-CM | POA: Diagnosis not present

## 2022-06-24 DIAGNOSIS — Z932 Ileostomy status: Secondary | ICD-10-CM | POA: Diagnosis not present

## 2022-06-24 NOTE — Transitions of Care (Post Inpatient/ED Visit) (Signed)
   06/24/2022  Name: Lisa Donovan MRN: [id number] DOB: 2001/01/11  Today's TOC FU Call Status: Today's TOC FU Call Status:: Unsuccessful Call (2nd Attempt) Unsuccessful Call (2nd Attempt) Date: 06/24/22  Attempted to reach the patient regarding the most recent Inpatient/ED visit.  Follow Up Plan: Additional outreach attempts will be made to reach the patient to complete the Transitions of Care (Post Inpatient/ED visit) call.     Antionette Fairy, RN,BSN,CCM [facility] Health/[facility] Care Management Care Management Community Coordinator Direct Phone: [phone] Toll Free: [phone] Fax: [phone]

## 2022-06-24 NOTE — Transitions of Care (Post Inpatient/ED Visit) (Signed)
   06/24/2022  Name: Lisa Donovan MRN: [id number] DOB: 01-Jul-2000  Today's TOC FU Call Status: Today's TOC FU Call Status:: Unsuccessful Call (3rd Attempt) TOC FU Call Complete Date: 06/24/22  Attempted to reach the patient regarding the most recent Inpatient/ED visit.  Follow Up Plan: No further outreach attempts will be made at this time. We have been unable to contact the patient.    Antionette Fairy, RN,BSN,CCM [facility] Health/[facility] Care Management Care Management Community Coordinator Direct Phone: [phone] Toll Free: [phone] Fax: [phone]

## 2022-06-26 DIAGNOSIS — Z932 Ileostomy status: Secondary | ICD-10-CM | POA: Diagnosis not present

## 2022-06-26 DIAGNOSIS — R112 Nausea with vomiting, unspecified: Secondary | ICD-10-CM | POA: Diagnosis not present

## 2022-06-26 DIAGNOSIS — R1084 Generalized abdominal pain: Secondary | ICD-10-CM | POA: Diagnosis not present

## 2022-06-26 DIAGNOSIS — E860 Dehydration: Secondary | ICD-10-CM | POA: Diagnosis not present

## 2022-06-26 MED ORDER — BUPRENORPHINE 20 MCG/HOUR WEEKLY TRANSDERMAL PATCH
MEDICATED_PATCH | TRANSDERMAL | 0 refills | 14 days | Status: CP
Start: 2022-06-26 — End: ?

## 2022-06-29 DIAGNOSIS — Z932 Ileostomy status: Secondary | ICD-10-CM | POA: Diagnosis not present

## 2022-06-29 DIAGNOSIS — E860 Dehydration: Secondary | ICD-10-CM | POA: Diagnosis not present

## 2022-06-29 DIAGNOSIS — R1084 Generalized abdominal pain: Secondary | ICD-10-CM | POA: Diagnosis not present

## 2022-06-29 DIAGNOSIS — R112 Nausea with vomiting, unspecified: Secondary | ICD-10-CM | POA: Diagnosis not present

## 2022-07-03 ENCOUNTER — Telehealth
Admit: 2022-07-03 | Discharge: 2022-07-04 | Payer: PRIVATE HEALTH INSURANCE | Attending: Gastroenterology | Primary: Gastroenterology

## 2022-07-03 DIAGNOSIS — K91850 Pouchitis: Principal | ICD-10-CM

## 2022-07-03 DIAGNOSIS — R109 Unspecified abdominal pain: Secondary | ICD-10-CM | POA: Diagnosis not present

## 2022-07-03 DIAGNOSIS — D1391 Familial adenomatous polyposis: Secondary | ICD-10-CM | POA: Diagnosis not present

## 2022-07-03 DIAGNOSIS — E860 Dehydration: Secondary | ICD-10-CM | POA: Diagnosis not present

## 2022-07-03 DIAGNOSIS — Z932 Ileostomy status: Secondary | ICD-10-CM | POA: Diagnosis not present

## 2022-07-03 DIAGNOSIS — R1084 Generalized abdominal pain: Secondary | ICD-10-CM | POA: Diagnosis not present

## 2022-07-03 DIAGNOSIS — R112 Nausea with vomiting, unspecified: Secondary | ICD-10-CM | POA: Diagnosis not present

## 2022-07-03 DIAGNOSIS — Z789 Other specified health status: Secondary | ICD-10-CM | POA: Diagnosis not present

## 2022-07-05 DIAGNOSIS — Z932 Ileostomy status: Secondary | ICD-10-CM | POA: Diagnosis not present

## 2022-07-05 DIAGNOSIS — R112 Nausea with vomiting, unspecified: Secondary | ICD-10-CM | POA: Diagnosis not present

## 2022-07-05 DIAGNOSIS — E860 Dehydration: Secondary | ICD-10-CM | POA: Diagnosis not present

## 2022-07-05 DIAGNOSIS — R1084 Generalized abdominal pain: Secondary | ICD-10-CM | POA: Diagnosis not present

## 2022-07-06 DIAGNOSIS — E860 Dehydration: Secondary | ICD-10-CM | POA: Diagnosis not present

## 2022-07-06 DIAGNOSIS — R112 Nausea with vomiting, unspecified: Secondary | ICD-10-CM | POA: Diagnosis not present

## 2022-07-06 DIAGNOSIS — Z932 Ileostomy status: Secondary | ICD-10-CM | POA: Diagnosis not present

## 2022-07-06 DIAGNOSIS — R1084 Generalized abdominal pain: Secondary | ICD-10-CM | POA: Diagnosis not present

## 2022-07-07 DIAGNOSIS — E860 Dehydration: Secondary | ICD-10-CM | POA: Diagnosis not present

## 2022-07-07 DIAGNOSIS — R112 Nausea with vomiting, unspecified: Secondary | ICD-10-CM | POA: Diagnosis not present

## 2022-07-07 DIAGNOSIS — R1084 Generalized abdominal pain: Secondary | ICD-10-CM | POA: Diagnosis not present

## 2022-07-07 DIAGNOSIS — Z932 Ileostomy status: Secondary | ICD-10-CM | POA: Diagnosis not present

## 2022-07-08 DIAGNOSIS — R112 Nausea with vomiting, unspecified: Secondary | ICD-10-CM | POA: Diagnosis not present

## 2022-07-08 DIAGNOSIS — E860 Dehydration: Secondary | ICD-10-CM | POA: Diagnosis not present

## 2022-07-08 DIAGNOSIS — R1084 Generalized abdominal pain: Secondary | ICD-10-CM | POA: Diagnosis not present

## 2022-07-08 DIAGNOSIS — Z932 Ileostomy status: Secondary | ICD-10-CM | POA: Diagnosis not present

## 2022-07-09 DIAGNOSIS — R1084 Generalized abdominal pain: Secondary | ICD-10-CM | POA: Diagnosis not present

## 2022-07-09 DIAGNOSIS — E860 Dehydration: Secondary | ICD-10-CM | POA: Diagnosis not present

## 2022-07-09 DIAGNOSIS — Z932 Ileostomy status: Secondary | ICD-10-CM | POA: Diagnosis not present

## 2022-07-09 DIAGNOSIS — R112 Nausea with vomiting, unspecified: Secondary | ICD-10-CM | POA: Diagnosis not present

## 2022-07-10 DIAGNOSIS — Z932 Ileostomy status: Secondary | ICD-10-CM | POA: Diagnosis not present

## 2022-07-10 DIAGNOSIS — R112 Nausea with vomiting, unspecified: Secondary | ICD-10-CM | POA: Diagnosis not present

## 2022-07-10 DIAGNOSIS — R1084 Generalized abdominal pain: Secondary | ICD-10-CM | POA: Diagnosis not present

## 2022-07-10 DIAGNOSIS — E860 Dehydration: Secondary | ICD-10-CM | POA: Diagnosis not present

## 2022-07-11 DIAGNOSIS — R112 Nausea with vomiting, unspecified: Secondary | ICD-10-CM | POA: Diagnosis not present

## 2022-07-11 DIAGNOSIS — E860 Dehydration: Secondary | ICD-10-CM | POA: Diagnosis not present

## 2022-07-11 DIAGNOSIS — Z932 Ileostomy status: Secondary | ICD-10-CM | POA: Diagnosis not present

## 2022-07-11 DIAGNOSIS — R1084 Generalized abdominal pain: Secondary | ICD-10-CM | POA: Diagnosis not present

## 2022-07-12 DIAGNOSIS — R112 Nausea with vomiting, unspecified: Secondary | ICD-10-CM | POA: Diagnosis not present

## 2022-07-12 DIAGNOSIS — E860 Dehydration: Secondary | ICD-10-CM | POA: Diagnosis not present

## 2022-07-12 DIAGNOSIS — R1084 Generalized abdominal pain: Secondary | ICD-10-CM | POA: Diagnosis not present

## 2022-07-12 DIAGNOSIS — Z932 Ileostomy status: Secondary | ICD-10-CM | POA: Diagnosis not present

## 2022-07-13 DIAGNOSIS — E860 Dehydration: Secondary | ICD-10-CM | POA: Diagnosis not present

## 2022-07-13 DIAGNOSIS — Z932 Ileostomy status: Secondary | ICD-10-CM | POA: Diagnosis not present

## 2022-07-13 DIAGNOSIS — R112 Nausea with vomiting, unspecified: Secondary | ICD-10-CM | POA: Diagnosis not present

## 2022-07-13 DIAGNOSIS — R1084 Generalized abdominal pain: Secondary | ICD-10-CM | POA: Diagnosis not present

## 2022-07-14 ENCOUNTER — Ambulatory Visit: Admit: 2022-07-14 | Discharge: 2022-07-14 | Payer: PRIVATE HEALTH INSURANCE

## 2022-07-14 DIAGNOSIS — Q8789 Other specified congenital malformation syndromes, not elsewhere classified: Principal | ICD-10-CM

## 2022-07-14 DIAGNOSIS — D500 Iron deficiency anemia secondary to blood loss (chronic): Principal | ICD-10-CM

## 2022-07-14 DIAGNOSIS — Z86718 Personal history of other venous thrombosis and embolism: Principal | ICD-10-CM

## 2022-07-14 DIAGNOSIS — E041 Nontoxic single thyroid nodule: Secondary | ICD-10-CM | POA: Diagnosis not present

## 2022-07-14 DIAGNOSIS — R42 Dizziness and giddiness: Secondary | ICD-10-CM | POA: Diagnosis not present

## 2022-07-14 DIAGNOSIS — Z885 Allergy status to narcotic agent status: Secondary | ICD-10-CM | POA: Diagnosis not present

## 2022-07-14 DIAGNOSIS — Z932 Ileostomy status: Secondary | ICD-10-CM | POA: Diagnosis not present

## 2022-07-14 DIAGNOSIS — D692 Other nonthrombocytopenic purpura: Secondary | ICD-10-CM | POA: Diagnosis not present

## 2022-07-14 DIAGNOSIS — E43 Unspecified severe protein-calorie malnutrition: Secondary | ICD-10-CM | POA: Diagnosis not present

## 2022-07-14 DIAGNOSIS — Z79899 Other long term (current) drug therapy: Secondary | ICD-10-CM | POA: Diagnosis not present

## 2022-07-14 DIAGNOSIS — R55 Syncope and collapse: Secondary | ICD-10-CM | POA: Diagnosis not present

## 2022-07-14 DIAGNOSIS — E860 Dehydration: Secondary | ICD-10-CM | POA: Diagnosis not present

## 2022-07-14 DIAGNOSIS — R112 Nausea with vomiting, unspecified: Secondary | ICD-10-CM | POA: Diagnosis not present

## 2022-07-14 DIAGNOSIS — I82711 Chronic embolism and thrombosis of superficial veins of right upper extremity: Secondary | ICD-10-CM | POA: Diagnosis not present

## 2022-07-14 DIAGNOSIS — Z8616 Personal history of COVID-19: Secondary | ICD-10-CM | POA: Diagnosis not present

## 2022-07-14 DIAGNOSIS — Z881 Allergy status to other antibiotic agents status: Secondary | ICD-10-CM | POA: Diagnosis not present

## 2022-07-14 DIAGNOSIS — I82721 Chronic embolism and thrombosis of deep veins of right upper extremity: Secondary | ICD-10-CM | POA: Diagnosis not present

## 2022-07-14 DIAGNOSIS — Z792 Long term (current) use of antibiotics: Secondary | ICD-10-CM | POA: Diagnosis not present

## 2022-07-14 DIAGNOSIS — D509 Iron deficiency anemia, unspecified: Secondary | ICD-10-CM | POA: Diagnosis not present

## 2022-07-14 DIAGNOSIS — R1084 Generalized abdominal pain: Secondary | ICD-10-CM | POA: Diagnosis not present

## 2022-07-14 DIAGNOSIS — Z9049 Acquired absence of other specified parts of digestive tract: Secondary | ICD-10-CM | POA: Diagnosis not present

## 2022-07-14 DIAGNOSIS — K219 Gastro-esophageal reflux disease without esophagitis: Secondary | ICD-10-CM | POA: Diagnosis not present

## 2022-07-14 DIAGNOSIS — N939 Abnormal uterine and vaginal bleeding, unspecified: Secondary | ICD-10-CM | POA: Diagnosis not present

## 2022-07-15 DIAGNOSIS — E860 Dehydration: Secondary | ICD-10-CM | POA: Diagnosis not present

## 2022-07-15 DIAGNOSIS — R112 Nausea with vomiting, unspecified: Secondary | ICD-10-CM | POA: Diagnosis not present

## 2022-07-15 DIAGNOSIS — Z932 Ileostomy status: Secondary | ICD-10-CM | POA: Diagnosis not present

## 2022-07-15 DIAGNOSIS — R1084 Generalized abdominal pain: Secondary | ICD-10-CM | POA: Diagnosis not present

## 2022-07-16 ENCOUNTER — Ambulatory Visit
Admit: 2022-07-16 | Discharge: 2022-07-17 | Payer: PRIVATE HEALTH INSURANCE | Attending: Student in an Organized Health Care Education/Training Program | Primary: Student in an Organized Health Care Education/Training Program

## 2022-07-16 DIAGNOSIS — R1084 Generalized abdominal pain: Principal | ICD-10-CM

## 2022-07-16 DIAGNOSIS — D48119 Desmoid tumor of unspecified site: Secondary | ICD-10-CM | POA: Diagnosis not present

## 2022-07-16 DIAGNOSIS — R601 Generalized edema: Principal | ICD-10-CM

## 2022-07-16 DIAGNOSIS — M7918 Myalgia, other site: Principal | ICD-10-CM

## 2022-07-16 DIAGNOSIS — Z3161 Procreative counseling and advice using natural family planning: Secondary | ICD-10-CM | POA: Diagnosis not present

## 2022-07-16 DIAGNOSIS — F419 Anxiety disorder, unspecified: Secondary | ICD-10-CM | POA: Diagnosis not present

## 2022-07-16 DIAGNOSIS — Z86718 Personal history of other venous thrombosis and embolism: Secondary | ICD-10-CM | POA: Diagnosis not present

## 2022-07-16 DIAGNOSIS — Z932 Ileostomy status: Secondary | ICD-10-CM | POA: Diagnosis not present

## 2022-07-16 DIAGNOSIS — G8929 Other chronic pain: Secondary | ICD-10-CM | POA: Diagnosis not present

## 2022-07-16 DIAGNOSIS — Z0000 Encounter for general adult medical examination without abnormal findings: Secondary | ICD-10-CM | POA: Diagnosis not present

## 2022-07-16 DIAGNOSIS — E43 Unspecified severe protein-calorie malnutrition: Secondary | ICD-10-CM | POA: Diagnosis not present

## 2022-07-16 DIAGNOSIS — N979 Female infertility, unspecified: Secondary | ICD-10-CM | POA: Diagnosis not present

## 2022-07-16 DIAGNOSIS — Z79899 Other long term (current) drug therapy: Secondary | ICD-10-CM | POA: Diagnosis not present

## 2022-07-16 DIAGNOSIS — R112 Nausea with vomiting, unspecified: Secondary | ICD-10-CM | POA: Diagnosis not present

## 2022-07-16 DIAGNOSIS — E221 Hyperprolactinemia: Secondary | ICD-10-CM | POA: Diagnosis not present

## 2022-07-16 DIAGNOSIS — E559 Vitamin D deficiency, unspecified: Secondary | ICD-10-CM | POA: Diagnosis not present

## 2022-07-16 DIAGNOSIS — M25511 Pain in right shoulder: Secondary | ICD-10-CM | POA: Diagnosis not present

## 2022-07-16 DIAGNOSIS — M25512 Pain in left shoulder: Secondary | ICD-10-CM | POA: Diagnosis not present

## 2022-07-16 DIAGNOSIS — Z113 Encounter for screening for infections with a predominantly sexual mode of transmission: Secondary | ICD-10-CM | POA: Diagnosis not present

## 2022-07-16 DIAGNOSIS — Z9049 Acquired absence of other specified parts of digestive tract: Secondary | ICD-10-CM | POA: Diagnosis not present

## 2022-07-16 DIAGNOSIS — D1391 Familial adenomatous polyposis: Secondary | ICD-10-CM | POA: Diagnosis not present

## 2022-07-16 DIAGNOSIS — E860 Dehydration: Secondary | ICD-10-CM | POA: Diagnosis not present

## 2022-07-16 DIAGNOSIS — Z3162 Encounter for fertility preservation counseling: Secondary | ICD-10-CM | POA: Diagnosis not present

## 2022-07-16 DIAGNOSIS — M791 Myalgia, unspecified site: Secondary | ICD-10-CM | POA: Diagnosis not present

## 2022-07-16 MED ORDER — PREGABALIN 25 MG CAPSULE
ORAL_CAPSULE | ORAL | 2 refills | 30 days | Status: CP
Start: 2022-07-16 — End: 2022-10-14

## 2022-07-16 MED ORDER — BUPRENORPHINE HCL 300 MCG BUCCAL FILM
ORAL_FILM | Freq: Two times a day (BID) | BUCCAL | 2 refills | 30 days | Status: CP
Start: 2022-07-16 — End: ?

## 2022-07-17 ENCOUNTER — Telehealth
Admit: 2022-07-17 | Discharge: 2022-07-18 | Payer: PRIVATE HEALTH INSURANCE | Attending: Student in an Organized Health Care Education/Training Program | Primary: Student in an Organized Health Care Education/Training Program

## 2022-07-17 DIAGNOSIS — F411 Generalized anxiety disorder: Principal | ICD-10-CM

## 2022-07-17 DIAGNOSIS — F32A Depression, unspecified depression type: Principal | ICD-10-CM

## 2022-07-17 DIAGNOSIS — R1084 Generalized abdominal pain: Secondary | ICD-10-CM | POA: Diagnosis not present

## 2022-07-17 DIAGNOSIS — E860 Dehydration: Secondary | ICD-10-CM | POA: Diagnosis not present

## 2022-07-17 DIAGNOSIS — R112 Nausea with vomiting, unspecified: Secondary | ICD-10-CM | POA: Diagnosis not present

## 2022-07-17 DIAGNOSIS — Z932 Ileostomy status: Secondary | ICD-10-CM | POA: Diagnosis not present

## 2022-07-17 MED ORDER — MIRTAZAPINE 7.5 MG TABLET
ORAL_TABLET | Freq: Every evening | ORAL | 4 refills | 30 days | Status: CP
Start: 2022-07-17 — End: 2022-12-14

## 2022-07-18 ENCOUNTER — Ambulatory Visit: Admit: 2022-07-18 | Discharge: 2022-07-19 | Payer: PRIVATE HEALTH INSURANCE

## 2022-07-18 DIAGNOSIS — D48119 Desmoid tumor of unspecified site: Secondary | ICD-10-CM | POA: Diagnosis not present

## 2022-07-18 DIAGNOSIS — Z932 Ileostomy status: Secondary | ICD-10-CM | POA: Diagnosis not present

## 2022-07-18 DIAGNOSIS — R1084 Generalized abdominal pain: Secondary | ICD-10-CM | POA: Diagnosis not present

## 2022-07-18 DIAGNOSIS — R112 Nausea with vomiting, unspecified: Secondary | ICD-10-CM | POA: Diagnosis not present

## 2022-07-18 DIAGNOSIS — E860 Dehydration: Secondary | ICD-10-CM | POA: Diagnosis not present

## 2022-07-19 DIAGNOSIS — Z932 Ileostomy status: Secondary | ICD-10-CM | POA: Diagnosis not present

## 2022-07-19 DIAGNOSIS — E860 Dehydration: Secondary | ICD-10-CM | POA: Diagnosis not present

## 2022-07-19 DIAGNOSIS — R1084 Generalized abdominal pain: Secondary | ICD-10-CM | POA: Diagnosis not present

## 2022-07-19 DIAGNOSIS — R112 Nausea with vomiting, unspecified: Secondary | ICD-10-CM | POA: Diagnosis not present

## 2022-07-20 DIAGNOSIS — R1084 Generalized abdominal pain: Secondary | ICD-10-CM | POA: Diagnosis not present

## 2022-07-20 DIAGNOSIS — E860 Dehydration: Secondary | ICD-10-CM | POA: Diagnosis not present

## 2022-07-20 DIAGNOSIS — Z932 Ileostomy status: Secondary | ICD-10-CM | POA: Diagnosis not present

## 2022-07-20 DIAGNOSIS — R112 Nausea with vomiting, unspecified: Secondary | ICD-10-CM | POA: Diagnosis not present

## 2022-07-21 ENCOUNTER — Ambulatory Visit: Admit: 2022-07-21 | Payer: PRIVATE HEALTH INSURANCE

## 2022-07-21 ENCOUNTER — Encounter: Admit: 2022-07-21 | Payer: PRIVATE HEALTH INSURANCE | Attending: Certified Registered"

## 2022-07-21 ENCOUNTER — Ambulatory Visit
Admit: 2022-07-21 | Discharge: 2022-08-20 | Disposition: A | Discharge status: Home Health | Payer: PRIVATE HEALTH INSURANCE | Admitting: Student in an Organized Health Care Education/Training Program

## 2022-07-21 DIAGNOSIS — E0590 Thyrotoxicosis, unspecified without thyrotoxic crisis or storm: Secondary | ICD-10-CM | POA: Diagnosis not present

## 2022-07-21 DIAGNOSIS — D1391 Familial adenomatous polyposis: Secondary | ICD-10-CM | POA: Diagnosis not present

## 2022-07-21 DIAGNOSIS — D126 Benign neoplasm of colon, unspecified: Secondary | ICD-10-CM | POA: Diagnosis not present

## 2022-07-21 DIAGNOSIS — R1909 Other intra-abdominal and pelvic swelling, mass and lump: Secondary | ICD-10-CM | POA: Diagnosis not present

## 2022-07-21 DIAGNOSIS — E038 Other specified hypothyroidism: Secondary | ICD-10-CM | POA: Diagnosis not present

## 2022-07-21 DIAGNOSIS — F331 Major depressive disorder, recurrent, moderate: Secondary | ICD-10-CM | POA: Diagnosis not present

## 2022-07-21 DIAGNOSIS — Z9049 Acquired absence of other specified parts of digestive tract: Secondary | ICD-10-CM | POA: Diagnosis not present

## 2022-07-21 DIAGNOSIS — D128 Benign neoplasm of rectum: Secondary | ICD-10-CM | POA: Diagnosis not present

## 2022-07-21 DIAGNOSIS — E02 Subclinical iodine-deficiency hypothyroidism: Secondary | ICD-10-CM | POA: Diagnosis not present

## 2022-07-21 DIAGNOSIS — D48113 Desmoid tumor of abdominal wall: Secondary | ICD-10-CM | POA: Diagnosis not present

## 2022-07-21 DIAGNOSIS — R1084 Generalized abdominal pain: Secondary | ICD-10-CM | POA: Diagnosis not present

## 2022-07-21 DIAGNOSIS — R638 Other symptoms and signs concerning food and fluid intake: Secondary | ICD-10-CM | POA: Diagnosis not present

## 2022-07-21 DIAGNOSIS — Z934 Other artificial openings of gastrointestinal tract status: Secondary | ICD-10-CM | POA: Diagnosis not present

## 2022-07-21 DIAGNOSIS — D509 Iron deficiency anemia, unspecified: Secondary | ICD-10-CM | POA: Diagnosis not present

## 2022-07-21 DIAGNOSIS — R52 Pain, unspecified: Secondary | ICD-10-CM | POA: Diagnosis not present

## 2022-07-21 DIAGNOSIS — D649 Anemia, unspecified: Secondary | ICD-10-CM | POA: Diagnosis not present

## 2022-07-21 DIAGNOSIS — K625 Hemorrhage of anus and rectum: Secondary | ICD-10-CM | POA: Diagnosis not present

## 2022-07-21 DIAGNOSIS — K91858 Other complications of intestinal pouch: Secondary | ICD-10-CM | POA: Diagnosis not present

## 2022-07-21 DIAGNOSIS — G8929 Other chronic pain: Secondary | ICD-10-CM | POA: Diagnosis not present

## 2022-07-21 DIAGNOSIS — E860 Dehydration: Secondary | ICD-10-CM | POA: Diagnosis not present

## 2022-07-21 DIAGNOSIS — R112 Nausea with vomiting, unspecified: Secondary | ICD-10-CM | POA: Diagnosis not present

## 2022-07-21 DIAGNOSIS — R51 Headache with orthostatic component, not elsewhere classified: Secondary | ICD-10-CM | POA: Diagnosis not present

## 2022-07-21 DIAGNOSIS — K91850 Pouchitis: Secondary | ICD-10-CM | POA: Diagnosis not present

## 2022-07-21 DIAGNOSIS — I499 Cardiac arrhythmia, unspecified: Secondary | ICD-10-CM | POA: Diagnosis not present

## 2022-07-21 DIAGNOSIS — Z86010 Personal history of colonic polyps: Secondary | ICD-10-CM | POA: Diagnosis not present

## 2022-07-21 DIAGNOSIS — R109 Unspecified abdominal pain: Secondary | ICD-10-CM | POA: Diagnosis not present

## 2022-07-21 DIAGNOSIS — Z452 Encounter for adjustment and management of vascular access device: Secondary | ICD-10-CM | POA: Diagnosis not present

## 2022-07-21 DIAGNOSIS — F329 Major depressive disorder, single episode, unspecified: Secondary | ICD-10-CM | POA: Diagnosis not present

## 2022-07-21 DIAGNOSIS — F32A Depression, unspecified: Secondary | ICD-10-CM | POA: Diagnosis not present

## 2022-07-21 DIAGNOSIS — D48114 Desmoid tumor, intraabdominal: Secondary | ICD-10-CM | POA: Diagnosis not present

## 2022-07-21 DIAGNOSIS — K5989 Other specified functional intestinal disorders: Secondary | ICD-10-CM | POA: Diagnosis not present

## 2022-07-21 DIAGNOSIS — R1011 Right upper quadrant pain: Secondary | ICD-10-CM | POA: Diagnosis not present

## 2022-07-21 DIAGNOSIS — Z980 Intestinal bypass and anastomosis status: Secondary | ICD-10-CM | POA: Diagnosis not present

## 2022-07-21 DIAGNOSIS — F419 Anxiety disorder, unspecified: Secondary | ICD-10-CM | POA: Diagnosis not present

## 2022-07-21 DIAGNOSIS — Z932 Ileostomy status: Secondary | ICD-10-CM | POA: Diagnosis not present

## 2022-07-21 DIAGNOSIS — R110 Nausea: Secondary | ICD-10-CM | POA: Diagnosis not present

## 2022-07-21 DIAGNOSIS — K921 Melena: Secondary | ICD-10-CM | POA: Diagnosis not present

## 2022-07-21 DIAGNOSIS — H5500 Unspecified nystagmus: Secondary | ICD-10-CM | POA: Diagnosis not present

## 2022-07-21 DIAGNOSIS — R7401 Elevation of levels of liver transaminase levels: Secondary | ICD-10-CM | POA: Diagnosis not present

## 2022-07-21 DIAGNOSIS — Z792 Long term (current) use of antibiotics: Secondary | ICD-10-CM | POA: Diagnosis not present

## 2022-07-21 DIAGNOSIS — R150 Incomplete defecation: Secondary | ICD-10-CM | POA: Diagnosis not present

## 2022-07-21 DIAGNOSIS — Z8616 Personal history of COVID-19: Secondary | ICD-10-CM | POA: Diagnosis not present

## 2022-07-21 DIAGNOSIS — G4700 Insomnia, unspecified: Secondary | ICD-10-CM | POA: Diagnosis not present

## 2022-07-21 DIAGNOSIS — F411 Generalized anxiety disorder: Secondary | ICD-10-CM | POA: Diagnosis not present

## 2022-07-21 DIAGNOSIS — D48119 Desmoid tumor of unspecified site: Secondary | ICD-10-CM | POA: Diagnosis not present

## 2022-07-21 DIAGNOSIS — R339 Retention of urine, unspecified: Secondary | ICD-10-CM | POA: Diagnosis not present

## 2022-07-21 DIAGNOSIS — R1031 Right lower quadrant pain: Secondary | ICD-10-CM | POA: Diagnosis not present

## 2022-07-21 DIAGNOSIS — K56609 Unspecified intestinal obstruction, unspecified as to partial versus complete obstruction: Secondary | ICD-10-CM | POA: Diagnosis not present

## 2022-07-21 DIAGNOSIS — K621 Rectal polyp: Secondary | ICD-10-CM | POA: Diagnosis not present

## 2022-07-21 DIAGNOSIS — K6389 Other specified diseases of intestine: Secondary | ICD-10-CM | POA: Diagnosis not present

## 2022-07-21 DIAGNOSIS — M6281 Muscle weakness (generalized): Secondary | ICD-10-CM | POA: Diagnosis not present

## 2022-07-21 DIAGNOSIS — K911 Postgastric surgery syndromes: Secondary | ICD-10-CM | POA: Diagnosis not present

## 2022-07-21 DIAGNOSIS — Z1152 Encounter for screening for COVID-19: Secondary | ICD-10-CM | POA: Diagnosis not present

## 2022-07-21 DIAGNOSIS — R100 Acute abdomen: Secondary | ICD-10-CM | POA: Diagnosis not present

## 2022-07-21 MED ORDER — OXYCODONE 10 MG TABLET
ORAL_TABLET | Freq: Four times a day (QID) | ORAL | 0 refills | 30 days | Status: CP | PRN
Start: 2022-07-21 — End: ?

## 2022-07-22 DIAGNOSIS — E860 Dehydration: Secondary | ICD-10-CM | POA: Diagnosis not present

## 2022-07-22 DIAGNOSIS — R112 Nausea with vomiting, unspecified: Secondary | ICD-10-CM | POA: Diagnosis not present

## 2022-07-22 DIAGNOSIS — R1084 Generalized abdominal pain: Secondary | ICD-10-CM | POA: Diagnosis not present

## 2022-07-22 DIAGNOSIS — Z932 Ileostomy status: Secondary | ICD-10-CM | POA: Diagnosis not present

## 2022-07-22 DIAGNOSIS — R1031 Right lower quadrant pain: Secondary | ICD-10-CM | POA: Diagnosis not present

## 2022-07-23 DIAGNOSIS — R1084 Generalized abdominal pain: Secondary | ICD-10-CM | POA: Diagnosis not present

## 2022-07-23 DIAGNOSIS — R112 Nausea with vomiting, unspecified: Secondary | ICD-10-CM | POA: Diagnosis not present

## 2022-07-23 DIAGNOSIS — R1031 Right lower quadrant pain: Secondary | ICD-10-CM | POA: Diagnosis not present

## 2022-07-23 DIAGNOSIS — Z932 Ileostomy status: Secondary | ICD-10-CM | POA: Diagnosis not present

## 2022-07-23 DIAGNOSIS — E860 Dehydration: Secondary | ICD-10-CM | POA: Diagnosis not present

## 2022-07-24 DIAGNOSIS — R112 Nausea with vomiting, unspecified: Secondary | ICD-10-CM | POA: Diagnosis not present

## 2022-07-24 DIAGNOSIS — Z932 Ileostomy status: Secondary | ICD-10-CM | POA: Diagnosis not present

## 2022-07-24 DIAGNOSIS — E860 Dehydration: Secondary | ICD-10-CM | POA: Diagnosis not present

## 2022-07-24 DIAGNOSIS — R1084 Generalized abdominal pain: Secondary | ICD-10-CM | POA: Diagnosis not present

## 2022-07-25 DIAGNOSIS — Z932 Ileostomy status: Secondary | ICD-10-CM | POA: Diagnosis not present

## 2022-07-25 DIAGNOSIS — R1084 Generalized abdominal pain: Secondary | ICD-10-CM | POA: Diagnosis not present

## 2022-07-25 DIAGNOSIS — R112 Nausea with vomiting, unspecified: Secondary | ICD-10-CM | POA: Diagnosis not present

## 2022-07-25 DIAGNOSIS — E860 Dehydration: Secondary | ICD-10-CM | POA: Diagnosis not present

## 2022-07-26 DIAGNOSIS — R1084 Generalized abdominal pain: Secondary | ICD-10-CM | POA: Diagnosis not present

## 2022-07-26 DIAGNOSIS — Z932 Ileostomy status: Secondary | ICD-10-CM | POA: Diagnosis not present

## 2022-07-26 DIAGNOSIS — E860 Dehydration: Secondary | ICD-10-CM | POA: Diagnosis not present

## 2022-07-26 DIAGNOSIS — R112 Nausea with vomiting, unspecified: Secondary | ICD-10-CM | POA: Diagnosis not present

## 2022-07-27 DIAGNOSIS — Z932 Ileostomy status: Secondary | ICD-10-CM | POA: Diagnosis not present

## 2022-07-27 DIAGNOSIS — E860 Dehydration: Secondary | ICD-10-CM | POA: Diagnosis not present

## 2022-07-27 DIAGNOSIS — R112 Nausea with vomiting, unspecified: Secondary | ICD-10-CM | POA: Diagnosis not present

## 2022-07-27 DIAGNOSIS — R1084 Generalized abdominal pain: Secondary | ICD-10-CM | POA: Diagnosis not present

## 2022-07-28 DIAGNOSIS — R1084 Generalized abdominal pain: Secondary | ICD-10-CM | POA: Diagnosis not present

## 2022-07-28 DIAGNOSIS — Z932 Ileostomy status: Secondary | ICD-10-CM | POA: Diagnosis not present

## 2022-07-28 DIAGNOSIS — E860 Dehydration: Secondary | ICD-10-CM | POA: Diagnosis not present

## 2022-07-28 DIAGNOSIS — R112 Nausea with vomiting, unspecified: Secondary | ICD-10-CM | POA: Diagnosis not present

## 2022-07-29 DIAGNOSIS — R1084 Generalized abdominal pain: Secondary | ICD-10-CM | POA: Diagnosis not present

## 2022-07-29 DIAGNOSIS — Z932 Ileostomy status: Secondary | ICD-10-CM | POA: Diagnosis not present

## 2022-07-29 DIAGNOSIS — E860 Dehydration: Secondary | ICD-10-CM | POA: Diagnosis not present

## 2022-07-29 DIAGNOSIS — R112 Nausea with vomiting, unspecified: Secondary | ICD-10-CM | POA: Diagnosis not present

## 2022-07-30 DIAGNOSIS — R112 Nausea with vomiting, unspecified: Secondary | ICD-10-CM | POA: Diagnosis not present

## 2022-07-30 DIAGNOSIS — E860 Dehydration: Secondary | ICD-10-CM | POA: Diagnosis not present

## 2022-07-30 DIAGNOSIS — R1084 Generalized abdominal pain: Secondary | ICD-10-CM | POA: Diagnosis not present

## 2022-07-30 DIAGNOSIS — Z932 Ileostomy status: Secondary | ICD-10-CM | POA: Diagnosis not present

## 2022-07-31 DIAGNOSIS — R1084 Generalized abdominal pain: Secondary | ICD-10-CM | POA: Diagnosis not present

## 2022-07-31 DIAGNOSIS — R112 Nausea with vomiting, unspecified: Secondary | ICD-10-CM | POA: Diagnosis not present

## 2022-07-31 DIAGNOSIS — Z932 Ileostomy status: Secondary | ICD-10-CM | POA: Diagnosis not present

## 2022-07-31 DIAGNOSIS — E860 Dehydration: Secondary | ICD-10-CM | POA: Diagnosis not present

## 2022-08-01 DIAGNOSIS — R1084 Generalized abdominal pain: Secondary | ICD-10-CM | POA: Diagnosis not present

## 2022-08-01 DIAGNOSIS — R112 Nausea with vomiting, unspecified: Secondary | ICD-10-CM | POA: Diagnosis not present

## 2022-08-01 DIAGNOSIS — E860 Dehydration: Secondary | ICD-10-CM | POA: Diagnosis not present

## 2022-08-01 DIAGNOSIS — Z932 Ileostomy status: Secondary | ICD-10-CM | POA: Diagnosis not present

## 2022-08-02 DIAGNOSIS — R112 Nausea with vomiting, unspecified: Secondary | ICD-10-CM | POA: Diagnosis not present

## 2022-08-02 DIAGNOSIS — Z932 Ileostomy status: Secondary | ICD-10-CM | POA: Diagnosis not present

## 2022-08-02 DIAGNOSIS — R1084 Generalized abdominal pain: Secondary | ICD-10-CM | POA: Diagnosis not present

## 2022-08-02 DIAGNOSIS — E860 Dehydration: Secondary | ICD-10-CM | POA: Diagnosis not present

## 2022-08-03 DIAGNOSIS — R1084 Generalized abdominal pain: Secondary | ICD-10-CM | POA: Diagnosis not present

## 2022-08-03 DIAGNOSIS — E860 Dehydration: Secondary | ICD-10-CM | POA: Diagnosis not present

## 2022-08-03 DIAGNOSIS — Z932 Ileostomy status: Secondary | ICD-10-CM | POA: Diagnosis not present

## 2022-08-03 DIAGNOSIS — R112 Nausea with vomiting, unspecified: Secondary | ICD-10-CM | POA: Diagnosis not present

## 2022-08-07 MED ORDER — NORETHINDRONE ACETATE 5 MG TABLET
ORAL_TABLET | 1 refills | 0 days
Start: 2022-08-07 — End: ?

## 2022-08-07 MED FILL — NORETHINDRONE ACETATE 5 MG TABLET: 30 days supply | Qty: 30 | Fill #0

## 2022-08-08 MED ORDER — OLANZAPINE 7.5 MG TABLET
ORAL_TABLET | Freq: Every evening | ORAL | 3 refills | 30 days | Status: CP
Start: 2022-08-08 — End: 2022-12-06

## 2022-08-12 MED ORDER — DOXYCYCLINE HYCLATE 100 MG TABLET
ORAL_TABLET | 0 refills | 0.00000 days
Start: 2022-08-12 — End: ?

## 2022-08-12 MED ORDER — CEFDINIR 300 MG CAPSULE
ORAL_CAPSULE | 0 refills | 0.00000 days
Start: 2022-08-12 — End: ?

## 2022-08-14 MED ORDER — MIRTAZAPINE 15 MG TABLET
ORAL_TABLET | Freq: Every evening | ORAL | 3 refills | 0 days
Start: 2022-08-14 — End: ?

## 2022-08-19 MED ORDER — CEFDINIR 300 MG CAPSULE
ORAL_CAPSULE | 0 refills | 0.00000 days
Start: 2022-08-19 — End: ?

## 2022-08-19 MED ORDER — TAMSULOSIN 0.4 MG CAPSULE
ORAL_CAPSULE | Freq: Every evening | ORAL | 0 refills | 30.00000 days
Start: 2022-08-19 — End: 2022-09-18

## 2022-08-19 MED ORDER — BUPRENORPHINE 2 MG-NALOXONE 0.5 MG SUBLINGUAL FILM
ORAL_FILM | Freq: Three times a day (TID) | SUBLINGUAL | 0 refills | 15.00000 days
Start: 2022-08-19 — End: 2022-09-02

## 2022-08-19 MED ORDER — BISACODYL 10 MG RECTAL SUPPOSITORY
Freq: Every day | RECTAL | 0 refills | 12.00000 days | PRN
Start: 2022-08-19 — End: 2022-09-18

## 2022-08-19 MED ORDER — DOXYCYCLINE HYCLATE 100 MG TABLET
ORAL_TABLET | 0 refills | 0.00000 days
Start: 2022-08-19 — End: ?

## 2022-08-19 MED ORDER — LORAZEPAM 0.5 MG TABLET
ORAL_TABLET | Freq: Four times a day (QID) | ORAL | 0 refills | 5.00000 days | PRN
Start: 2022-08-19 — End: 2022-09-02

## 2022-08-19 MED ORDER — ONDANSETRON 4 MG DISINTEGRATING TABLET
ORAL_TABLET | Freq: Four times a day (QID) | ORAL | 0 refills | 5.00000 days | PRN
Start: 2022-08-19 — End: 2022-08-26

## 2022-08-19 MED ORDER — MIRTAZAPINE 7.5 MG TABLET
ORAL_TABLET | Freq: Every evening | ORAL | 0 refills | 30.00000 days
Start: 2022-08-19 — End: 2022-09-18

## 2022-08-19 MED ORDER — PROMETHAZINE 12.5 MG TABLET
ORAL_TABLET | Freq: Four times a day (QID) | ORAL | 0 refills | 5.00000 days | PRN
Start: 2022-08-19 — End: 2022-08-26

## 2022-08-20 MED ORDER — LIDOCAINE 4 % TOPICAL PATCH
MEDICATED_PATCH | Freq: Every day | TRANSDERMAL | 0 refills | 7.00000 days | Status: CP
Start: 2022-08-20 — End: ?
  Filled 2022-08-20: qty 15, 7d supply, fill #0

## 2022-08-20 MED ORDER — BUPRENORPHINE 2 MG-NALOXONE 0.5 MG SUBLINGUAL FILM
ORAL_FILM | Freq: Three times a day (TID) | SUBLINGUAL | 0 refills | 15.00000 days | Status: CP
Start: 2022-08-20 — End: 2022-09-04
  Filled 2022-08-20: qty 11, 15d supply, fill #0

## 2022-08-20 MED ORDER — MIRTAZAPINE 7.5 MG TABLET
ORAL_TABLET | Freq: Every evening | ORAL | 0 refills | 0.00000 days | Status: CP
Start: 2022-08-20 — End: 2022-09-19

## 2022-08-20 MED ORDER — MIRTAZAPINE 15 MG TABLET
ORAL_TABLET | Freq: Every evening | ORAL | 0 refills | 0.00000 days | Status: CP
Start: 2022-08-20 — End: 2022-08-20
  Filled 2022-08-20: qty 30, 30d supply, fill #0

## 2022-08-20 MED ORDER — TAMSULOSIN 0.4 MG CAPSULE
ORAL_CAPSULE | Freq: Every evening | ORAL | 0 refills | 30.00000 days | Status: CP
Start: 2022-08-20 — End: 2022-09-19
  Filled 2022-08-20: qty 60, 30d supply, fill #0

## 2022-08-20 MED ORDER — ONDANSETRON 4 MG DISINTEGRATING TABLET
ORAL_TABLET | Freq: Four times a day (QID) | ORAL | 0 refills | 5.00000 days | Status: CP | PRN
Start: 2022-08-20 — End: 2022-08-27
  Filled 2022-08-20: qty 20, 5d supply, fill #0

## 2022-08-20 MED ORDER — DOXYCYCLINE HYCLATE 100 MG TABLET
ORAL_TABLET | 0 refills | 0.00000 days | Status: CP
Start: 2022-08-20 — End: ?
  Filled 2022-08-20: qty 28, 24d supply, fill #0

## 2022-08-20 MED ORDER — PROMETHAZINE 12.5 MG TABLET
ORAL_TABLET | Freq: Four times a day (QID) | ORAL | 0 refills | 5.00000 days | Status: CP | PRN
Start: 2022-08-20 — End: 2022-08-27
  Filled 2022-08-20: qty 20, 5d supply, fill #0

## 2022-08-20 MED ORDER — OXYCODONE 10 MG TABLET
ORAL_TABLET | ORAL | 0 refills | 0.00000 days | Status: CP
Start: 2022-08-20 — End: ?
  Filled 2022-08-20: qty 42, 14d supply, fill #0

## 2022-08-20 MED ORDER — LORAZEPAM 0.5 MG TABLET
ORAL_TABLET | Freq: Three times a day (TID) | ORAL | 0 refills | 7.00000 days | Status: CP | PRN
Start: 2022-08-20 — End: 2022-09-03

## 2022-08-20 MED ORDER — PANTOPRAZOLE 40 MG TABLET,DELAYED RELEASE
ORAL_TABLET | Freq: Every day | ORAL | 0 refills | 30.00000 days | Status: CP
Start: 2022-08-20 — End: 2022-09-19
  Filled 2022-08-20: qty 30, 30d supply, fill #0

## 2022-08-20 MED ORDER — LIDOCAINE 5 % TOPICAL PATCH
MEDICATED_PATCH | TRANSDERMAL | 0 refills | 7.00000 days | Status: CP
Start: 2022-08-20 — End: 2022-08-20

## 2022-08-20 MED ORDER — BISACODYL 10 MG RECTAL SUPPOSITORY
Freq: Every day | RECTAL | 0 refills | 12.00000 days | Status: CP | PRN
Start: 2022-08-20 — End: 2022-09-19

## 2022-08-21 ENCOUNTER — Telehealth
Admit: 2022-08-21 | Discharge: 2022-08-22 | Payer: PRIVATE HEALTH INSURANCE | Attending: Student in an Organized Health Care Education/Training Program | Primary: Student in an Organized Health Care Education/Training Program

## 2022-08-21 DIAGNOSIS — F411 Generalized anxiety disorder: Principal | ICD-10-CM

## 2022-08-21 DIAGNOSIS — F321 Major depressive disorder, single episode, moderate: Principal | ICD-10-CM

## 2022-08-21 DIAGNOSIS — Z932 Ileostomy status: Secondary | ICD-10-CM | POA: Diagnosis not present

## 2022-08-21 DIAGNOSIS — E860 Dehydration: Secondary | ICD-10-CM | POA: Diagnosis not present

## 2022-08-21 DIAGNOSIS — R1084 Generalized abdominal pain: Secondary | ICD-10-CM | POA: Diagnosis not present

## 2022-08-21 DIAGNOSIS — Z3141 Encounter for fertility testing: Secondary | ICD-10-CM | POA: Diagnosis not present

## 2022-08-21 DIAGNOSIS — R112 Nausea with vomiting, unspecified: Secondary | ICD-10-CM | POA: Diagnosis not present

## 2022-08-21 MED ORDER — LORAZEPAM 0.5 MG TABLET
ORAL_TABLET | Freq: Three times a day (TID) | ORAL | 2 refills | 90 days | Status: CP | PRN
Start: 2022-08-21 — End: 2023-05-18
  Filled 2022-08-20: qty 20, 7d supply, fill #0

## 2022-08-22 DIAGNOSIS — Z932 Ileostomy status: Secondary | ICD-10-CM | POA: Diagnosis not present

## 2022-08-22 DIAGNOSIS — R112 Nausea with vomiting, unspecified: Secondary | ICD-10-CM | POA: Diagnosis not present

## 2022-08-22 DIAGNOSIS — E860 Dehydration: Secondary | ICD-10-CM | POA: Diagnosis not present

## 2022-08-22 DIAGNOSIS — R1084 Generalized abdominal pain: Secondary | ICD-10-CM | POA: Diagnosis not present

## 2022-08-23 DIAGNOSIS — Z932 Ileostomy status: Secondary | ICD-10-CM | POA: Diagnosis not present

## 2022-08-23 DIAGNOSIS — R1084 Generalized abdominal pain: Secondary | ICD-10-CM | POA: Diagnosis not present

## 2022-08-23 DIAGNOSIS — R112 Nausea with vomiting, unspecified: Secondary | ICD-10-CM | POA: Diagnosis not present

## 2022-08-23 DIAGNOSIS — E860 Dehydration: Secondary | ICD-10-CM | POA: Diagnosis not present

## 2022-08-24 DIAGNOSIS — Z932 Ileostomy status: Secondary | ICD-10-CM | POA: Diagnosis not present

## 2022-08-24 DIAGNOSIS — E860 Dehydration: Secondary | ICD-10-CM | POA: Diagnosis not present

## 2022-08-24 DIAGNOSIS — R1084 Generalized abdominal pain: Secondary | ICD-10-CM | POA: Diagnosis not present

## 2022-08-24 DIAGNOSIS — R112 Nausea with vomiting, unspecified: Secondary | ICD-10-CM | POA: Diagnosis not present

## 2022-08-25 DIAGNOSIS — E860 Dehydration: Secondary | ICD-10-CM | POA: Diagnosis not present

## 2022-08-25 DIAGNOSIS — Z932 Ileostomy status: Secondary | ICD-10-CM | POA: Diagnosis not present

## 2022-08-25 DIAGNOSIS — R112 Nausea with vomiting, unspecified: Secondary | ICD-10-CM | POA: Diagnosis not present

## 2022-08-25 DIAGNOSIS — R1084 Generalized abdominal pain: Secondary | ICD-10-CM | POA: Diagnosis not present

## 2022-08-26 DIAGNOSIS — R1084 Generalized abdominal pain: Secondary | ICD-10-CM | POA: Diagnosis not present

## 2022-08-26 DIAGNOSIS — E860 Dehydration: Secondary | ICD-10-CM | POA: Diagnosis not present

## 2022-08-26 DIAGNOSIS — Z932 Ileostomy status: Secondary | ICD-10-CM | POA: Diagnosis not present

## 2022-08-26 DIAGNOSIS — R112 Nausea with vomiting, unspecified: Secondary | ICD-10-CM | POA: Diagnosis not present

## 2022-08-27 DIAGNOSIS — Z932 Ileostomy status: Secondary | ICD-10-CM | POA: Diagnosis not present

## 2022-08-27 DIAGNOSIS — R112 Nausea with vomiting, unspecified: Secondary | ICD-10-CM | POA: Diagnosis not present

## 2022-08-27 DIAGNOSIS — R1084 Generalized abdominal pain: Secondary | ICD-10-CM | POA: Diagnosis not present

## 2022-08-27 DIAGNOSIS — E860 Dehydration: Secondary | ICD-10-CM | POA: Diagnosis not present

## 2022-08-28 DIAGNOSIS — Z932 Ileostomy status: Secondary | ICD-10-CM | POA: Diagnosis not present

## 2022-08-28 DIAGNOSIS — R112 Nausea with vomiting, unspecified: Secondary | ICD-10-CM | POA: Diagnosis not present

## 2022-08-28 DIAGNOSIS — R1084 Generalized abdominal pain: Secondary | ICD-10-CM | POA: Diagnosis not present

## 2022-08-28 DIAGNOSIS — E860 Dehydration: Secondary | ICD-10-CM | POA: Diagnosis not present

## 2022-08-29 DIAGNOSIS — E860 Dehydration: Secondary | ICD-10-CM | POA: Diagnosis not present

## 2022-08-29 DIAGNOSIS — Z932 Ileostomy status: Secondary | ICD-10-CM | POA: Diagnosis not present

## 2022-08-29 DIAGNOSIS — R1084 Generalized abdominal pain: Secondary | ICD-10-CM | POA: Diagnosis not present

## 2022-08-29 DIAGNOSIS — R112 Nausea with vomiting, unspecified: Secondary | ICD-10-CM | POA: Diagnosis not present

## 2022-08-30 DIAGNOSIS — R112 Nausea with vomiting, unspecified: Secondary | ICD-10-CM | POA: Diagnosis not present

## 2022-08-30 DIAGNOSIS — Z932 Ileostomy status: Secondary | ICD-10-CM | POA: Diagnosis not present

## 2022-08-30 DIAGNOSIS — R1084 Generalized abdominal pain: Secondary | ICD-10-CM | POA: Diagnosis not present

## 2022-08-30 DIAGNOSIS — E860 Dehydration: Secondary | ICD-10-CM | POA: Diagnosis not present

## 2022-08-31 DIAGNOSIS — Z932 Ileostomy status: Secondary | ICD-10-CM | POA: Diagnosis not present

## 2022-08-31 DIAGNOSIS — R1084 Generalized abdominal pain: Secondary | ICD-10-CM | POA: Diagnosis not present

## 2022-08-31 DIAGNOSIS — R112 Nausea with vomiting, unspecified: Secondary | ICD-10-CM | POA: Diagnosis not present

## 2022-08-31 DIAGNOSIS — E860 Dehydration: Secondary | ICD-10-CM | POA: Diagnosis not present

## 2022-09-01 DIAGNOSIS — Z932 Ileostomy status: Secondary | ICD-10-CM | POA: Diagnosis not present

## 2022-09-01 DIAGNOSIS — R112 Nausea with vomiting, unspecified: Secondary | ICD-10-CM | POA: Diagnosis not present

## 2022-09-01 DIAGNOSIS — R1084 Generalized abdominal pain: Secondary | ICD-10-CM | POA: Diagnosis not present

## 2022-09-01 DIAGNOSIS — E860 Dehydration: Secondary | ICD-10-CM | POA: Diagnosis not present

## 2022-09-01 MED ORDER — LETROZOLE 2.5 MG TABLET
ORAL_TABLET | Freq: Every evening | ORAL | 0 refills | 7 days
Start: 2022-09-01 — End: ?

## 2022-09-01 MED ORDER — OXYCODONE 5 MG TABLET
ORAL_TABLET | Freq: Four times a day (QID) | ORAL | 0 refills | 2 days | PRN
Start: 2022-09-01 — End: ?

## 2022-09-01 MED ORDER — CABERGOLINE 0.5 MG TABLET
ORAL_TABLET | Freq: Every evening | ORAL | 0 refills | 7 days
Start: 2022-09-01 — End: ?

## 2022-09-02 ENCOUNTER — Ambulatory Visit: Admit: 2022-09-02 | Discharge: 2022-09-03 | Payer: PRIVATE HEALTH INSURANCE

## 2022-09-02 ENCOUNTER — Ambulatory Visit
Admit: 2022-09-02 | Discharge: 2022-09-03 | Payer: PRIVATE HEALTH INSURANCE | Attending: Diagnostic Radiology | Primary: Diagnostic Radiology

## 2022-09-02 DIAGNOSIS — D48119 Desmoid tumor of unspecified site: Secondary | ICD-10-CM | POA: Diagnosis not present

## 2022-09-02 DIAGNOSIS — R112 Nausea with vomiting, unspecified: Secondary | ICD-10-CM | POA: Diagnosis not present

## 2022-09-02 DIAGNOSIS — R1084 Generalized abdominal pain: Secondary | ICD-10-CM | POA: Diagnosis not present

## 2022-09-02 DIAGNOSIS — E860 Dehydration: Secondary | ICD-10-CM | POA: Diagnosis not present

## 2022-09-02 DIAGNOSIS — Z932 Ileostomy status: Secondary | ICD-10-CM | POA: Diagnosis not present

## 2022-09-02 MED FILL — LETROZOLE 2.5 MG TABLET: ORAL | 7 days supply | Qty: 14 | Fill #0

## 2022-09-02 MED FILL — CABERGOLINE 0.5 MG TABLET: ORAL | 7 days supply | Qty: 7 | Fill #0

## 2022-09-03 ENCOUNTER — Telehealth
Admit: 2022-09-03 | Discharge: 2022-09-04 | Payer: PRIVATE HEALTH INSURANCE | Attending: Student in an Organized Health Care Education/Training Program | Primary: Student in an Organized Health Care Education/Training Program

## 2022-09-03 DIAGNOSIS — R208 Other disturbances of skin sensation: Principal | ICD-10-CM

## 2022-09-03 DIAGNOSIS — G893 Neoplasm related pain (acute) (chronic): Principal | ICD-10-CM

## 2022-09-03 DIAGNOSIS — K6389 Other specified diseases of intestine: Principal | ICD-10-CM

## 2022-09-03 DIAGNOSIS — M7918 Myalgia, other site: Principal | ICD-10-CM

## 2022-09-03 DIAGNOSIS — G894 Chronic pain syndrome: Principal | ICD-10-CM

## 2022-09-03 DIAGNOSIS — R198 Other specified symptoms and signs involving the digestive system and abdomen: Principal | ICD-10-CM

## 2022-09-03 DIAGNOSIS — T40601A Poisoning by unspecified narcotics, accidental (unintentional), initial encounter: Principal | ICD-10-CM

## 2022-09-03 DIAGNOSIS — D48119 Desmoid tumor: Principal | ICD-10-CM

## 2022-09-03 DIAGNOSIS — R1084 Generalized abdominal pain: Principal | ICD-10-CM

## 2022-09-03 DIAGNOSIS — R112 Nausea with vomiting, unspecified: Secondary | ICD-10-CM | POA: Diagnosis not present

## 2022-09-03 DIAGNOSIS — D48116 Desmoid tumor of lower extremity and pelvic girdle: Secondary | ICD-10-CM | POA: Diagnosis not present

## 2022-09-03 DIAGNOSIS — D48114 Desmoid tumor, intraabdominal: Secondary | ICD-10-CM | POA: Diagnosis not present

## 2022-09-03 DIAGNOSIS — Z932 Ileostomy status: Secondary | ICD-10-CM | POA: Diagnosis not present

## 2022-09-03 DIAGNOSIS — D48111 Desmoid tumor of chest wall: Secondary | ICD-10-CM | POA: Diagnosis not present

## 2022-09-03 DIAGNOSIS — E860 Dehydration: Secondary | ICD-10-CM | POA: Diagnosis not present

## 2022-09-04 ENCOUNTER — Ambulatory Visit
Admit: 2022-09-04 | Payer: PRIVATE HEALTH INSURANCE | Attending: Student in an Organized Health Care Education/Training Program | Primary: Student in an Organized Health Care Education/Training Program

## 2022-09-04 DIAGNOSIS — E860 Dehydration: Secondary | ICD-10-CM | POA: Diagnosis not present

## 2022-09-04 DIAGNOSIS — Z932 Ileostomy status: Secondary | ICD-10-CM | POA: Diagnosis not present

## 2022-09-04 DIAGNOSIS — R112 Nausea with vomiting, unspecified: Secondary | ICD-10-CM | POA: Diagnosis not present

## 2022-09-04 DIAGNOSIS — R1084 Generalized abdominal pain: Secondary | ICD-10-CM | POA: Diagnosis not present

## 2022-09-05 MED ORDER — BUPRENORPHINE 2 MG-NALOXONE 0.5 MG SUBLINGUAL FILM
ORAL_FILM | Freq: Three times a day (TID) | SUBLINGUAL | 0 refills | 31.00000 days | Status: CP
Start: 2022-09-05 — End: 2022-09-03

## 2022-09-08 MED ORDER — OXYCODONE 10 MG TABLET
ORAL_TABLET | Freq: Three times a day (TID) | ORAL | 0 refills | 30 days | Status: CP | PRN
Start: 2022-09-08 — End: ?

## 2022-09-09 DIAGNOSIS — R1084 Generalized abdominal pain: Secondary | ICD-10-CM | POA: Diagnosis not present

## 2022-09-16 ENCOUNTER — Other Ambulatory Visit: Admit: 2022-09-16 | Discharge: 2022-09-17 | Payer: PRIVATE HEALTH INSURANCE

## 2022-09-16 ENCOUNTER — Ambulatory Visit: Admit: 2022-09-16 | Discharge: 2022-09-17 | Payer: PRIVATE HEALTH INSURANCE

## 2022-09-16 DIAGNOSIS — D48119 Desmoid tumor of unspecified site: Secondary | ICD-10-CM | POA: Diagnosis not present

## 2022-09-16 DIAGNOSIS — R294 Clicking hip: Secondary | ICD-10-CM | POA: Diagnosis not present

## 2022-09-16 DIAGNOSIS — K567 Ileus, unspecified: Secondary | ICD-10-CM | POA: Diagnosis not present

## 2022-09-16 DIAGNOSIS — Z9089 Acquired absence of other organs: Secondary | ICD-10-CM | POA: Diagnosis not present

## 2022-09-16 DIAGNOSIS — K922 Gastrointestinal hemorrhage, unspecified: Secondary | ICD-10-CM | POA: Diagnosis not present

## 2022-09-16 DIAGNOSIS — I82A11 Acute embolism and thrombosis of right axillary vein: Secondary | ICD-10-CM | POA: Diagnosis not present

## 2022-09-16 DIAGNOSIS — F419 Anxiety disorder, unspecified: Secondary | ICD-10-CM | POA: Diagnosis not present

## 2022-09-16 DIAGNOSIS — D48113 Desmoid tumor of abdominal wall: Secondary | ICD-10-CM | POA: Diagnosis not present

## 2022-09-16 DIAGNOSIS — Z79899 Other long term (current) drug therapy: Secondary | ICD-10-CM | POA: Diagnosis not present

## 2022-09-16 DIAGNOSIS — M25559 Pain in unspecified hip: Secondary | ICD-10-CM | POA: Diagnosis not present

## 2022-09-16 DIAGNOSIS — D509 Iron deficiency anemia, unspecified: Secondary | ICD-10-CM | POA: Diagnosis not present

## 2022-09-16 DIAGNOSIS — Z86718 Personal history of other venous thrombosis and embolism: Secondary | ICD-10-CM | POA: Diagnosis not present

## 2022-09-16 MED ORDER — NIROGACESTAT 50 MG TABLET
ORAL_TABLET | Freq: Two times a day (BID) | ORAL | 0 refills | 30 days | Status: CP
Start: 2022-09-16 — End: 2022-10-16
  Filled 2022-09-19: qty 90, 15d supply, fill #0

## 2022-09-16 NOTE — Unmapped (Signed)
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    Desmoid fibromatosis, history of [name]     She has had numerous sites of disease, including paraspinal, chest wall, right arm/wrist. She has had several cryoablations with Dr. Cathie Hoops which have seemed to offer clinical benefit. She trialed sorafenib which seemed to lead to shrinkage of lesions, but she had significant intolerance with dizziness, presyncope, fatigue, hair loss, rash, GI side effects.      We have discussed at length the nature of desmoid fibromatosis tumors, the fact they can wax and wane independent of therapy, are not cancers in a metastatic potential, but can be locally infiltrative and cause substantial morbidity to the site of disease and occasionally mortality. I reviewed that the emerging standard of care is observation based on the fact that 20-30% will experience spontaneous regression in the year following diagnosis. Given her long experience with desmoids, complete regression may be less likely, but we will still approach this cautiously with parallel goals of avoidance of overtreatment (particularly surgical) and palliation of symptoms / preservation of function.     At first visit in 2022, her lesions are in the shoulder, right forearm, chest area, with a possible desmoid in her left thigh. Symptoms are manageable at the moment, after recent cryoablations and steroid injection to the forearm.    06/21/21: Has had a complex few months, on TPN, significant fluid overload, caused worsening desmoid pain. Now off TPN, nutrition slowly improving, tumor pain better. Scans show 2 ill defined soft tissue nodules, concerning for recurrent desmoid. Start with surveillance, given risks of systemic therapy after recent significant GI and medical issues.     09/03/21: Scans show stable disease. Was admitted for abdominal pain, concern for intussusception, N/V, but we discussed that these may not be related to her desmoids. Opted for continued surveillance.    12/03/21: Scans show slow growth when reflecting over the past several months. Importantly, she reports substantially worsening abdominal pain which is centered on her desmoids, poor appetite, weight loss, possible transient obstructive symptoms. Long conversation about options and pros and cons of each. She would like to trial sorafenib again, acknowledging the risks. She will meet with GI, her psychiatry team (she has had challenges with duloxetine), pain team, and may reconnect with Dr. Selena Batten in the interim to discuss other supportive care measures and procedural options.     9/28-10/13/23: Hospitalization for abdominal pain, malnutrition. Pouchoscopy 10/4 without stricture. Initiated on tube feeds. Sorafenib was initiated ~ 10/3 and tolerated reasonably well in house.    12/31/21: Tox check. Sorafenib 200mg  daily, but a variety of ongoing issues. Diarrhea (which may be drug related), ongoing abdominal pain (perhaps partially desmoid related but not exclusively), intolerance of PO diet and even tube feeds (driven by pain / cramping), rash (drug related), anxiety.     01/10/22-01/22/22: 2-week hospitalization, due to uncontrolled pain in the setting of recent celiac plexus block, complicated by GI bleeding in setting of anticoagulation due to line associated DVT. She subsequently required multiple iron infusions, and was discharged on eliquis 2.5mg  BID.  During the hospitalization was noted to be persistently tachycardic, likely multifactorial etiology.  Additionally during her hospital admission was restarted on TPN in consultation with her GI team.  Patient was discharged on 01/22/2022 on TPN.      12/6-12/13/23: Another hospitalization, for GI bleed in setting of sorafenib and anticoagulation. Pain control was difficult, underwent endoscopy to evaluate for pouchitis.    02/28/22: Continued challenges since hospital stay. Reviewed several key factors: clear growth of desmoid tumors in Sept, prior to initiation of sorafenib. Clear response/shrinkage of desmoid tumors on Dec 10 scans, indicating treatment response to 200mg  sorafenib. Pouch biopsy revealed pouchitis, but this was at the site of an ulcer, thus not unexpected. Depression remains a big challenge, will coordinate with psychiatry. Resume sorafenib 200mg . Reviewed risk of re-bleeding, but this is reduced in the setting of discontinuation of anticoagulation.    1/1-1/18/24: Hospitalization for pain, recurrent GI bleeding. Stopped sorafenib. SSRI trialed but recurrent bleeding thus held as well. Efforts at weaning opioids. Significant depression.    04/08/22: Nirogacestat approved. Having some bleeding, wants to talk to fertility counselor before initiating.    05/02/22: Start nirogacestat when she is ready, pending fertility conversation. Ongoing bleeding, trial TXA. Iron infusion - dextran.    06/06/22: Still plan to initiate nirogacestat pending fertility conversation (scheduled with patient for April 11).  Bleeding improved with TXA.  Patient with 1 week of significant pain.  Admit for pain control and re-imaging with CT abdomen pelvis to r/o ileus. CT with stable desmoids, no ileus. IV iron given.    07/18/22: Working with the fertility team to pursue preservation before therapy.     09/02/22: Recent hospitalization for pain. Ongoing workup with fertility clinic.  Assessment & Plan  1. [name].  The abdominal wall desmoid tumor has shown a slight increase in size from 2 cm to 2.6 cm. Given her symptoms and the potential growth, initiating treatment with nirogacestat is reasonable. The medication is expected to prevent further growth and potentially induce some shrinkage. Potential side effects, including headache, electrolyte imbalance, and rash, were discussed. She will maintain physical activity to aid recovery. A ferritin level test and thyroid levels will be checked today. An appointment with Dr. [name] will be scheduled to assess her nutritional status and gastrointestinal health. The line will be removed in a month or two, depending on her response to therapy.    2. Pain Management.  She has been experiencing increased pain, particularly in the hip, which may be due to increased physical activity following hospitalization. She has previously responded well to steroid injections. The plan includes continuing with the current pain management strategy and considering a steroid injection if necessary. She aims to reduce her oxycodone intake to two tablets a day and eventually discontinue it.     3. Post-Fertility Preservation Symptoms.  She is experiencing hot flashes and cramping, likely due to the recent hormonal treatment for fertility preservation. These symptoms are expected to subside as her body adjusts post-procedure.    4. Nutritional Status.  Her hemoglobin level is satisfactory at 11.6, indicating good nutritional status. Kidney function is optimal with a creatinine level of 0.63, and electrolyte levels are within normal range, except for chloride. Liver function tests (LFTs) are also within normal limits. Weight gain is necessary given her previous malnourished state.     Follow-up  A follow-up visit is scheduled in 1 month for a toxicity check and to monitor for any side effects from the new medication.  Anxiety  AYA Needs  Patient is 21 and in the past year has had the collision of numerous medical challenges which have pushed her to see her health and future medical care more clearly. Trying to balance all of her medical issues this with future goals of career, nursing, college. Significant burden of anxiety, maybe depression as well. Was reportedly diagnosed with medical trauma by the counseling office at her university. Also trying to establish her own autonomy after a medicalized childhood  - connected with AYA team, [name]. May need input from [facility] or others at some point  - seen by AYA palliative care sarcoma collaborative, Dr. Lanetta Inch, will assist with pain management and symptom control, as well as grappling with current issues relating to cancer, AYA, development     Tumor pain: seen by AYA sarcoma palliative care, Dr. Saddie Benders, as well as pain team - [name] has been working closely with her    Contraception  Fertility Management  -completed egg retrieval with successful egg harvest! 22 eggs    Line Associated DVT:   Duplex demonstrates DVT shortly after line placement. Treated with Apixaban 5mg  BID x 3 mos, now completed    Plan:  -RTC 1 month for tox check    I personally reviewed the medical records, pathology and laboratory results and viewed the imaging. All questions were answered to the patient's apparent satisfaction and they voiced understanding and agreement with the plan. Pt has my card and contact information and is encouraged to reach out with any further questions.    Donzetta Sprung, MD/MPH  Assistant Professor  Bone and Soft Tissue Oncology Program  [facility]  Pager: [phone]  Nurse Navigator: Roseanne Reno RN, BSN, OCN      REASON FOR CONSULTATION:   [name] [name] is a 22 y.o. female who is seen in consultation at the request of Jarold Motto, [facility] for evaluation of her desmoid fibromatosis    HISTORY OF PRESENT ILLNESS:      Attends school at [employer] in [place], [place]      Right arm desmoid is the most bothersome - significant arm cramping, deep aching pain, lasts for ~30 min after cramping starts. Has noticed right hand shaking frequently. 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     Left chest lesion used to be quite uncomfortable, caused swelling in her arm and discomfort. Improved after cryoablation.     Right shoulder blade hurts when she leans back against it, fairly painful. Right chest wall lesion has some associated pain, improving since cryo.     First diagnosed at 22 years old - seen in [place] at first, had numerous cysts. First major issues were two paraspinal lesions, referred to [facility] for biopsy and ultimately resection.  Soon diagnosed with [name], seen by medical genetics early on.  Guided by Dr. Debbe Mounts, referred out to proceduralists as needed.  2011 - had a growing lesion on her back as well as bilateral neck lesions, resected at [facility] [place], found to be desmoid  02/2012 - surgery for thoracic spine desmoid. Maybe another flank lesion removed  2017 - wrist lesion treated by VIR with bupivicaine, kenalog  08/2016 - endoscopy with adenomatous polyp of the colon, non-dysplastic  02/2017 - subcutaneous lesion resection by plastic surgery (postauricular and RUE, RLE) - path revealed epidermal inclusion cysts, osteomas  05/2018: cryotherapy of left anterior chest wall lesion  05/2018: started sulindac but struggled with adherence due to  GI side effects  08/2018: cryo #2 to chest wall lesion  12/10/2018: started sorafenib due to progression in left paraspinal lesion, ?posterior chest wall lesion  02/2019: discontinued sorafenib for several reasons: fatigue, dizziness, nausea, hair loss, as well as COVID infection in late November. Derm consulted for rash / hair loss, started on antihistamines, topical steroids. Trialed lower dose but ultimately stopped again 05/2019 due to toxicity.  06/2019: follow up with surgical oncology - plans for observation rather than repeat surgery  07/2019: cryoablation of anterior chest wall desmoid  07/2019: excision of subcutaneous LE mass and L neck masses, clinically appeared consistent with cysts  08/2019: cryoablation of R posterior chest wall, left paraspinal desmoid     Interval History:  History of Present Illness  The patient presents for evaluation of multiple medical concerns. She is accompanied by her mother.    She has completed the fertility preservation process, which was less challenging than anticipated. However, she experienced bowel issues due to enlarged follicles or ovaries, which resolved post-procedure. Despite these challenges, she managed to preserve 22 eggs. She has been in contact with a Child psychotherapist named Fleet Contras about joining a support group.    Her energy levels are low, possibly due to a recent hospital stay and the egg retrieval process. She has been experiencing constant hot flashes since the procedure, along with increased sweating and deconditioning. Her pain levels have increased, requiring up to 5 oxycodone tablets daily, although she is trying to reduce this to 2 tablets. She also reports new pain that made it difficult to urinate for a week, starting 1-2 days after the procedure.    She has a large tumor in one leg and multiple cysts in the other. She is considering starting a new medication as her tumors, particularly in her wrist and abdomen, are causing significant discomfort. She has an upcoming dermatology appointment on 10/09/2022. She has gained weight, increasing from 109 to 140 pounds since starting TPN, and is concerned this may be causing additional pressure on her abdomen. She is also experiencing hip pain and clicking, and is wondering if a new baseline scan of her leg is needed.    She has discussed steroid injections with Dr. Cathie Hoops for her hand mobility issues, which are affecting her ability to hold a pen. 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 She is also considering having her line removed before starting school.    She declined an iron infusion during her hospital stay due to concerns about potential reactions affecting her fertility. She is curious about her RDW levels and whether her thyroid needs to be checked while on sorafenib. She has an endocrinology appointment scheduled for 11/12/2022.      REVIEW OF SYSTEMS:  A comprehensive review of 12 systems was negative except for pertinent positives noted in HPI.    Past Medical History, Surgical History, Family History were reviewed personally. Any changes were updated as above.    Social History:  Attended [employer] in [place], has been interrupted by health issues  Wants to be a Engineer, civil (consulting) in pediatric hematology/oncology   Mother Natalia Leatherwood is a strong advocate, often present at visits    Pregnancy status: premenopausal, interested in future children    ALLERGIES/MEDICATIONS:  Reviewed in Epic    PHYSICAL EXAM:   BP 114/72  - Pulse 91  - Temp 36.6 ??C (97.9 ??F)  - Ht 165.1 cm (5' 5)  -  Wt 62 kg (136 lb 11.2 oz)  - LMP  (LMP Unknown) Comment: HCG negative - SpO2 100%  - BMI 22.75 kg/m??   ECOG 1  Gen - well appearing, in some distress, flushed, mother accompanying her   Eyes - conjunctivae clear, PERRL  ENT -no scleral icterus, moist mucous membranes  Lymph - no cervical or supraclavicular lymphadenopathy appreciated  Resp - clear to auscultation bilaterally, no wheezes, crackles or rales  CV - normal rate, regular rhythm, no lower extremity edema noted  Skin - flushing of skin noted on chest  Neuro - AOx3, EOM intact, no facial droop, speech fluent and coherent, moves all extremities without asymmetry  Psych - affect anxious  MSK - no joint tenderness or swelling    PATHOLOGY:   Pathology was personally reviewed as described in the HPI, detailed in Epic.     LABS:  Reviewed in Epic    RADIOLOGY:  Imaging was personally viewed and interpreted as summarized in the history (see above).

## 2022-09-16 NOTE — Unmapped (Unsigned)
labs drawn from [facility] & sent for analysis.  Lumens flushed & heparin-locked.  Patient sent to next appointment. Care provided by Carlisle Cater, RN.

## 2022-09-17 ENCOUNTER — Telehealth: Admit: 2022-09-17 | Discharge: 2022-09-17 | Payer: PRIVATE HEALTH INSURANCE | Attending: Clinical | Primary: Clinical

## 2022-09-17 ENCOUNTER — Ambulatory Visit: Admit: 2022-09-17 | Discharge: 2022-09-17 | Payer: PRIVATE HEALTH INSURANCE | Attending: Clinical | Primary: Clinical

## 2022-09-17 DIAGNOSIS — F32A Depression, unspecified: Secondary | ICD-10-CM | POA: Diagnosis not present

## 2022-09-17 DIAGNOSIS — R1084 Generalized abdominal pain: Secondary | ICD-10-CM | POA: Diagnosis not present

## 2022-09-17 DIAGNOSIS — F411 Generalized anxiety disorder: Secondary | ICD-10-CM | POA: Diagnosis not present

## 2022-09-17 DIAGNOSIS — F410 Panic disorder [episodic paroxysmal anxiety] without agoraphobia: Secondary | ICD-10-CM | POA: Diagnosis not present

## 2022-09-17 MED ORDER — MIRTAZAPINE 15 MG TABLET
ORAL_TABLET | Freq: Every evening | ORAL | 3 refills | 90 days
Start: 2022-09-17 — End: ?

## 2022-09-17 NOTE — Unmapped (Signed)
[facility] Specialty Medication Onboarding    Specialty Medication: [name]  Prior Authorization: Not Required   Financial Assistance: No - copay  <$25  Final Copay/Day Supply: $0 / 15    Insurance Restrictions: Yes - max 15 day supply for first 1-3 months of therapy     Notes to Pharmacist:   Credit Card on File: not applicable    The triage team has completed the benefits investigation and has determined that the patient is able to fill this medication at [facility]. Please contact the patient to complete the onboarding or follow up with the prescribing physician as needed.

## 2022-09-18 DIAGNOSIS — Q120 Congenital cataract: Secondary | ICD-10-CM | POA: Diagnosis not present

## 2022-09-18 DIAGNOSIS — H53001 Unspecified amblyopia, right eye: Secondary | ICD-10-CM | POA: Diagnosis not present

## 2022-09-18 DIAGNOSIS — H52201 Unspecified astigmatism, right eye: Secondary | ICD-10-CM | POA: Diagnosis not present

## 2022-09-18 DIAGNOSIS — H5201 Hypermetropia, right eye: Secondary | ICD-10-CM | POA: Diagnosis not present

## 2022-09-18 NOTE — Unmapped (Signed)
Adolescent and Young Adult Cancer Program Visit     Service date: September 16, 2022    Encounter location: Multidisciplinary Clinic    Clinician: [name], LCSW - AYA Clinical Social Worker    Patient identifiers: [name] [name] is a 22 y.o. with desmoid tumors and [name]. [name] [name] uses she/her pronouns.      Additional visit participants: Keyly's mother [name] [name], Dr. [name], Dr. Lanetta Inch    Visit Summary     Met with the patient for evaluation of needs that may be addressed by [facility].    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 Provided psychoeducation and framing around goal setting and compassionate expectations of herself and her body. Coordinated connection to DBT skills group with Wynetta Emery, LCSW.    Follow Up/Plan:     Will see later this week for counseling.    I will continue to follow this patient for AYA-appropriate support. They have my contact information and have been encouraged to contact me as needed.     Flowsheet     AYA Assessment  Primary Caregiver: Parent  Location Seen: Clinic  Contact Point: During treatment  Referral Source: AYA team  Issues Discussed: Treatment information, Fertility preservation, Family planning, School, Mental Health, Physical Activity  AYA Team Interventions: Psychoeducation, Care coordination, Adapting care to AYA needs  Follow Up: Other (Comment) (This week)  Time Spent (in minutes): 65    09/18/2022     [name], LCSW  Adolescent and Young Adult Clinical Social Worker

## 2022-09-18 NOTE — Unmapped (Addendum)
[facility]   Patient Onboarding/Medication Counseling    Due to her significant GI issues, per clinic: She should continue taking acid suppression meds as is and just have her take each nirogacestat dose with 8-12oz of coke or pepsi.     [name] [name] is a 22 y.o. female with [name] who I am counseling today on initiation of therapy.  I am speaking to  mom .    Was a Nurse, learning disability used for this call? No    Verified patient's date of birth / HIPAA.    Specialty medication(s) to be sent: Hematology/Oncology: [name]    Non-specialty medications/supplies to be sent: none    Medications not needed at this time: none     Ogsiveo (nirogacestat)    Medication & Administration     Dosage: Desmoid tumors, progressive: Take 3 tablets (150 mg) by mouth twice daily; continue until disease progression or unacceptable toxicity    Administration:   Administer with or without food.   Swallow tablets whole; do not break, crush, or chew.    Adherence/Missed dose instructions:  Skip the missed dose and go back to your normal time.  Do not take 2 doses at the same time or extra doses.    Goals of Therapy   Treatment of progressing desmoid tumors in adults who require systemic treatment.    Side Effects & Monitoring Parameters   List common side effects:  Dermatologic:  Skin rash (68%) - use alcohol free, fragrance free and dye free moisturizer  Alopecia (19%),   Folliculitis (<15%),   Dermatologic disorder (hidradenitis suppurativa: <15%),  Skin carcinoma (nonmelanoma: <15%)  Endocrine & metabolic:   Decreased serum phosphate (20% to 65%),   Decreased serum potassium (22%)  Gastrointestinal:  Diarrhea (84%) - Median time to first event of diarrhea - 9 days (range: 2 to 434 days).  Nausea (54%; grade 3: 1%),   Mouth irritation or mouth sores (39%; grade 3: 4%)  Abdominal pain (22%),   Genitourinary:   Glycosuria (51%),   Ovarian disease (75%, including amenorrhea, ovarian failure, menopause, and premature menopause; serious: 4%),   Proteinuria (40%)  Hepatic:   Increased serum alanine aminotransferase (30%),  Increased serum aspartate aminotransferase (33%)  Nervous system:   Fatigue (54%),   Headache (30%)  Respiratory:   Cough (20%),   Shortness of breath (16%),   Nose bleeds (<15%),   Flu-like symptoms (<15%),   Upper respiratory tract infection (17%)    The following side effects should be reported to the provider:   Signs of an allergic reaction  Signs of liver problems - dark urine, tiredness, lighter colored stool, yellowing of the skin or eyes  Signs of electrolyte problems: mood changes, confusion, muscle pain, cramps, spasms, weakness, shakiness  Signs of high blood sugar: confusion, increased drowsiness, increased thirst, increased hunger, or increase urination.  Signs of low estrogen: hot flashes, night sweats or vaginal dryness  Period (menstrual) changes  Shortness of breath  Certain types of skin cancer have happened in people taking this drug. Call your doctor right away if you have a change in color or size of a mole, or any new or changing skin lump or growth.    Monitoring Parameters:  LFTs, phosphate and potassium levels regularly during treatment.   Perform dermatologic evaluations at baseline and routinely during treatment.   Verify pregnancy status prior to treatment initiation in patients who could become pregnant.  Signs/symptoms of electrolyte disturbances (phosphate, potassium), GI toxicity (diarrhea), ovarian  toxicity (changes in menstrual cycle regularity or symptoms of estrogen deficiency [e.g., hot flashes, night sweats, vaginal dryness]), and/or nonmelanoma skin cancers.    Contraindications, Warnings, & Precautions   Electrolyte abnormalities: Alterations in potassium and phosphate levels may occur during treatment with nirogacestat. In a clinical study, two-thirds of patients experienced decreased phosphate and one-quarter of patients experienced decreased potassium; phosphate <2 mg/dL and grade 3 hypokalemia also occurred.  GI toxicity: Diarrhea, including severe cases, can occur with nirogacestat treatment. A majority of patients treated with nirogacestat in a clinical study experienced diarrhea; grade 3 events were also observed. Median time to first event of diarrhea was 9 days (range: 2 to 434 days).  Hepatotoxicity: AST or ALT elevations occurred in approximately one-third of nirogacestat-treated patients; grade 3 events were also observed.  Skin cancers: Treatment with nirogacestat may result in the occurrence of new nonmelanoma skin cancers (including cutaneous squamous cell carcinoma and basal cell carcinoma).    Pregnancy and Lactation   Verify pregnancy status prior to treatment initiation.  Patients who could become pregnant and patients with partners who could become pregnant should use effective contraception during therapy and for 1 week after the last dose.  Nirogacestat may cause ovarian toxicity and impair fertility; impact may depend on multiple factors including duration of therapy and gonadal function at the start of treatment. Monitor for menstrual cycle changes and symptoms of estrogen deficiency (e.g., hot flashes, night sweats, vaginal dryness).  Based on the mechanism of action and data from animal reproduction studies using doses less than the recommended human dose (based on AUC), in utero exposure to nirogacestat may cause fetal harm.  It is not known if nirogacestat is present in breast milk.  Due to the potential for serious adverse reactions in the breastfed infant, breastfeeding is not recommended by the manufacturer during therapy and for 1 week after the last dose.    Drug/Food Interactions     Medication list reviewed in Epic. The patient was instructed to inform the care team before taking any new medications or supplements. Coadministration of proton pump inhibitors (PPIs) or H2 antagonists may reduce the bioavailability of nirogacestat, leading to decreased systemic levels and effectiveness .  Avoid Grapefruit, Seville oranges, and starfruit.  Avoid PPI's or H2 blockers while on this medication.  If needed, may take antacids (TUMS/Rolaids) 2 hours before or 2 hours after taking this medication.  Storage, Handling Precautions, & Disposal   Store at room temperature in a dry place. Do not store in a bathroom.  Keep all drugs in a safe place. Keep all drugs out of the reach of children and pets.  Throw away unused or expired drugs. Do not flush down a toilet or pour down a drain unless you are told to do so. Check with your pharmacist if you have questions about the best way to throw out drugs. There may be drug take-back programs in your area.  If a caregiver prepares your dose for you, they should consider wearing gloves or pour the pills directly from their container into the cap, a small cup, or directly into your hand. They should avoid touching the pills. They should always wash their hands before and after giving you the medication.    Current Medications (including OTC/herbals), Comorbidities and Allergies     Current Outpatient Medications   Medication Sig Dispense Refill    acetaminophen (TYLENOL) 500 MG tablet Take 2 tablets (1,000 mg total) by mouth every eight (8) hours.  ammonium lactate (LAC-HYDRIN) 12 % lotion Apply 1 Application topically two (2) times a day.      baclofen (LIORESAL) 5 mg Tab tablet Take 1 tablet (5 mg total) by mouth Three (3) times a day as needed for muscle spasms. 30 tablet 0    bisacodyl (DULCOLAX) 10 mg suppository Unwrap then insert 1 suppository (10 mg total) into the rectum daily as needed. 12 suppository 0    buprenorphine-naloxone (SUBOXONE) 2-0.5 mg sublingual film Place 0.25 Film (0.5 mg of buprenorphine total) under the tongue three (3) times a day. Fill on or after: 09/05/22 22 Film 0    cabergoline (DOSTINEX) 0.5 mg tablet Take 1 tablet by mouth starting the night of your Egg Retrieval and continue for 7 days. Take as directed. 7 tablet 0    cefdinir (OMNICEF) 300 MG capsule Take 1 capsule (300mg ) twice a day on Mon, Wed, Friday      cholecalciferol, vitamin D3 25 mcg, 1,000 units,, 1,000 unit (25 mcg) tablet Take 1 tablet (25 mcg total) by mouth daily. 100 tablet 3    clobetasoL (TEMOVATE) 0.05 % ointment Apply topically two (2) times a day. To stubborn/thick rashes on hands/feet ears until clear/smooth. Restart as needed 60 g 3    diclofenac sodium (VOLTAREN) 1 % gel Apply 2 g topically four (4) times a day as needed for pain.      doxycycline (VIBRA-TABS) 100 MG tablet Take 1 tablets by mouth twice a day on Tuesday, Thursday, Saturday, Sunday. 28 tablet 0    famotidine (PEPCID) 20 MG tablet Take 1 tablet (20 mg total) by mouth nightly. 90 tablet 3    fluticasone propionate (FLONASE) 50 mcg/actuation nasal spray 1 spray on skin prior to application of butrans patch to reduce irritation and itching 16 g 0    hydrocortisone (ANUSOL-HC) 2.5 % rectal cream Insert into the rectum two (2) times a day as needed.      letrozole (FEMARA) 2.5 mg tablet Take 2 tablets by mouth starting the night of your Egg Retrieval and continue for 7 days. Take as directed. 14 tablet 0    lidocaine 4 % patch Place 2 patches on the skin daily for 7 days. Apply to affected area for 12 hours, then remove for 12 hours. 15 patch 0    LORazepam (ATIVAN) 0.5 MG tablet Take 1 tablet (0.5 mg total) by mouth Three (3) times a day as needed for anxiety. 270 tablet 2    mirtazapine (REMERON) 15 MG tablet Take  1 tablet by mouth (15mg ) at night with 7.5mg  tablet to make 22.5mg  nightly 30 tablet 0    mirtazapine (REMERON) 7.5 MG tablet Take 1 tablet by mouth (7.5mg ) with 15mg  tab to make 22.5mg  daily 30 tablet 0    naloxone (NARCAN) 4 mg nasal spray One spray in either nostril once for known/suspected opioid overdose. May repeat every 2-3 minutes in alternating nostril til EMS arrives 2 each 0    nirogacestat (OGSIVEO) 50 mg tablet Take 3 tablets (150 mg total) by mouth two (2) times a day. Swallow tablets whole; do not break, crush, or chew. 180 tablet 0    norethindrone (AYGESTIN) 5 mg tablet Take 1 tablet by mouth once a day as directed at the same time each day. 30 tablet 1    oxyCODONE (ROXICODONE) 10 mg immediate release tablet Take 1 tablet (10 mg total) by mouth Three (3) times a day as needed for pain. Fill on or after: 09/08/22 90 tablet  0    pantoprazole (PROTONIX) 40 MG tablet Take 1 tablet (40 mg total) by mouth daily. 30 tablet 0    pimecrolimus (ELIDEL) 1 % cream Apply topically two (2) times a day as needed. To face as needed for rash      pregabalin (LYRICA) 25 MG capsule Take 1 capsule (25 mg total) by mouth every morning AND 2 capsules (50 mg total) nightly. 90 capsule 2    tamsulosin (FLOMAX) 0.4 mg capsule Take 2 capsules (0.8 mg total) by mouth nightly. 60 capsule 0    triamcinolone (KENALOG) 0.1 % cream Apply topically two (2) times a day as needed. Apply to rash as needed      zinc oxide 10 % Crea Apply 1 application. topically daily as needed.       No current facility-administered medications for this visit.       Allergies   Allergen Reactions    Adhesive Tape-Silicones Hives and Rash     Paper tape  And tegederm ok    Ferrlecit [Sodium Ferric Gluconat-Sucrose] Swelling and Rash    Levofloxacin Swelling and Rash     Swelling in mouth, rash,     Methylnaltrexone      Per Patient: I lost bowel control, severe abdominal cramping, and elevated BP    Neomycin Swelling     Rxn after ear drops; ear swelling    Papaya Hives    Morphine Nausea And Vomiting    Zosyn [Piperacillin-Tazobactam] Itching and Rash     Red and itchy    Compazine [Prochlorperazine] Other (See Comments)     Extreme agitation    Iron Analogues     Iron Dextran Itching     Received iron dextran 06/08/22 over 12 hours, had itching and redness/flushing during the infusion and for a couple days after. Required IV benadryl w/flares between doses and ultimately treated w/IV methylpred for 2 days.     Latex, Natural Rubber Rash       Patient Active Problem List   Diagnosis    [name]    Intestinal polyps    [name]    Benign neoplasm of skin of trunk    Other benign neoplasm of connective and other soft tissue of upper limb, including shoulder    History of colonic polyps    [name] of skin    Thyroid cyst    Syncope    Chemotherapy-induced nausea    Neoplasm related pain    Iron deficiency anemia due to chronic blood loss    Dehydration    Physical deconditioning    History of colectomy    Acute deep vein thrombosis (DVT) of axillary vein of right upper extremity (CMS-HCC)    Severe protein-calorie malnutrition (CMS-HCC)    Winged scapula of right side    Lower abdominal pain    Intractable abdominal pain    Generalized anxiety disorder with panic attacks    Hyperglycemia    Hypoglycemia    SBO (small bowel obstruction) (CMS-HCC)    GI bleeding    MDD (major depressive disorder)    Small intestinal bacterial overgrowth (SIBO)    Ileus (CMS-HCC)    Hypersensitivity reaction    Anemia    On total parenteral nutrition (TPN)    Urinary retention    High risk medication use       Reviewed and up to date in Epic.    Appropriateness of Therapy     Acute infections noted within Epic:  No active infections  Patient reported infection: None    Is medication and dose appropriate based on diagnosis and infection status? Yes    Prescription has been clinically reviewed: Yes    Patient-Reported Symptoms Tracker for Cancer Patients on Oral Chemotherapy     Oral chemotherapy medication name(s): Ogsiveo  Dose and frequency: 150 mg twice daily  Oral Chemotherapy Start Date:    Baseline? Yes  Clinic(s) visited: [facility]    Symptom Grouping Question Patient Response   Digestion and Eating Have you felt sick to your stomach?      Had diarrhea?      Constipated?      Not wanting to eat?      Comments      Sleep and Pain Felt very tired even after you rest?      Pain due to cancer medication or cancer?      Comments     Other Side Effects Numbness or tingling in hands and/or feet?      Felt short of breath?      Mouth or throat Sores?      Rash?      Palmar-plantar erythrodysesthesia syndrome?      Rash - acneiform?      Rash - maculo-papular?      How many days over the past month did your cancer medication or cancer keep you from your normal activities?  Write in number of days, 0-30:       Other side effects or things you would like to discuss?      Comments?     Adherence  In the last 30 days, on how many days did you miss at least one dose of any of your [drug name]? Write in number of days, 0-30:       What reasons are you having trouble taking your medication [pharmacist: check all that apply]? Specify chemotherapy cycle:             Comments:        Comments       Optional Symptom Tracking Comments:      Financial Information     Medication Assistance provided: Prior Authorization    Anticipated copay of $0 / 15 days reviewed with patient. Verified delivery address.    Delivery Information     Scheduled delivery date: 09/22/22    Expected start date: ASAP    Medication will be delivered via UPS to the prescription address in [facility].  This shipment will not require a signature.      Explained the services we provide at [facility] and that each month we would call to set up refills.  Stressed importance of returning phone calls so that we could ensure they receive their medications in time each month.  Informed patient that we should be setting up refills 7-10 days prior to when they will run out of medication.  A pharmacist will reach out to perform a clinical assessment periodically.  Informed patient that a welcome packet, containing information about our pharmacy and other support services, a Notice of Privacy Practices, and a drug information handout will be sent.      The patient or caregiver noted above participated in the development of this care plan and knows that they can request review of or adjustments to the care plan at any time.      Patient or caregiver verbalized understanding of the above information as well as how to contact  the pharmacy at [phone] option 4 with any questions/concerns.  The pharmacy is open Monday through Friday 8:30am-4:30pm.  A pharmacist is available 24/7 via pager to answer any clinical questions they may have.    Patient Specific Needs     Does the patient have any physical, cognitive, or cultural barriers? No    Does the patient have adequate living arrangements? (i.e. the ability to store and take their medication appropriately) Yes    Did you identify any home environmental safety or security hazards? No    Patient prefers to have medications discussed with   patient or mother      Is the patient or caregiver able to read and understand education materials at a high school level or above? Yes    Patient's primary language is  English     Is the patient high risk? No    SOCIAL DETERMINANTS OF HEALTH     At the [facility], we have learned that life circumstances - like trouble affording food, housing, utilities, or transportation can affect the health of many of our patients.   That is why we wanted to ask: are you currently experiencing any life circumstances that are negatively impacting your health and/or quality of life? No    Social Determinants of Health     Financial Resource Strain: Low Risk  (03/14/2022)    Overall Financial Resource Strain (CARDIA)     Difficulty of Paying Living Expenses: Not very hard   Internet Connectivity: Not on file   Food Insecurity: No Food Insecurity (03/14/2022)    Hunger Vital Sign     Worried About Running Out of Food in the Last Year: Never true     Ran Out of Food in the Last Year: Never true   Tobacco Use: Low Risk  (09/02/2022)    Patient History     Smoking Tobacco Use: Never     Smokeless Tobacco Use: Never     Passive Exposure: Past   Housing/Utilities: Low Risk  (03/14/2022) Housing/Utilities     Within the past 12 months, have you ever stayed: outside, in a car, in a tent, in an overnight shelter, or temporarily in someone else's home (i.e. couch-surfing)?: No     Are you worried about losing your housing?: No     Within the past 12 months, have you been unable to get utilities (heat, electricity) when it was really needed?: No   Alcohol Use: Not on file   Transportation Needs: No Transportation Needs (03/14/2022)    PRAPARE - Transportation     Lack of Transportation (Medical): No     Lack of Transportation (Non-Medical): No   Substance Use: Not on file   Health Literacy: Not on file   Physical Activity: Not on file   Interpersonal Safety: Unknown (09/17/2022)    Interpersonal Safety     Unsafe Where You Currently Live: Not on file     Physically Hurt by Anyone: Not on file     Abused by Anyone: Not on file   Stress: Not on file   Intimate Partner Violence: Not on file   Depression: Not at risk (01/25/2022)    Received from [facility]    PHQ-2   Social Connections: Not on file       Would you be willing to receive help with any of the needs that you have identified today? Not applicable       Kermit Balo, [facility]  [facility] Specialty Pharmacist
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
## 2022-09-22 NOTE — Unmapped (Signed)
Adolescent and Young Adult Cancer Program Visit     Service date: September 22, 2022    Encounter location: Virtual: Video    Clinician: [name], LCSW - AYA Clinical Social Worker    Patient identifiers: [name] [name] is a 22 y.o. with desmoid tumors and [name]. [name] [name] uses she/her pronouns.      Visit Summary     Met with the patient for scheduled supportive counseling.    [name] [name] reports increased anxiety since getting out of the hospital. She fears being in public due primarily to concerns about being seen by people she knows; she ultimately feels this is related to her own negative self-image due to both weight gain and to being so sick and functionally impaired. She feels she is more withdrawn than normal, preferring to be in her room alone and not being very talkative even with family.    She also describes having a difficult time finding positives in her life, and feels overwhelmed by everything she feels she has lost lately (independence, health, school, friendships, boyfriend, confidence, enjoyable activities). Assessed for safety and SI; she denies current feelings or thoughts of not wanting to live, but states that she is worried that she may experience these thoughts if she is unable to return to school and complete the semester. Denies plan or intent to harm self. Explored existing protective factors and potential support needs. She identified that she needs to feel more understood by family and friends, and we discussed communication strategies to help her express her specific support needs to her network. She states she feels able to consider and possibly try these strategies over the next week, and identified topics she would like to discuss in counseling next week. She was able to reflect positively on a recent experience with a friend and on training with her emotional support dog Layla; explored positive self-thoughts and feelings related to these experiences, and opportunities for more similar experiences.    Follow Up/Plan:     Will see in one week for scheduled counseling.    I will continue to follow this patient for AYA-appropriate support. They have my contact information and have been encouraged to contact me as needed.     Flowsheet     AYA Assessment  Primary Caregiver: Parent  Location Seen: Administrator, Civil Service: During treatment  Referral Source: AYA team  Issues Discussed: Mental Health, School  AYA Team Interventions: Psychoeducation, Supportive counseling  Follow Up: 1 week  Time Spent (in minutes): 55    09/22/2022     [name], LCSW  Adolescent and Young Adult Visual merchandiser

## 2022-09-23 DIAGNOSIS — K638219 Small intestinal bacterial overgrowth (SIBO): Principal | ICD-10-CM

## 2022-09-23 DIAGNOSIS — R1084 Generalized abdominal pain: Secondary | ICD-10-CM | POA: Diagnosis not present

## 2022-09-23 MED ORDER — DOXYCYCLINE HYCLATE 100 MG TABLET
ORAL_TABLET | 0 refills | 0 days | Status: CP
Start: 2022-09-23 — End: ?

## 2022-09-23 NOTE — Unmapped (Signed)
VIR pre call attempt.  LM for call back.

## 2022-09-24 ENCOUNTER — Ambulatory Visit: Admit: 2022-09-24 | Discharge: 2022-09-25 | Payer: PRIVATE HEALTH INSURANCE

## 2022-09-24 DIAGNOSIS — D500 Iron deficiency anemia secondary to blood loss (chronic): Principal | ICD-10-CM

## 2022-09-24 DIAGNOSIS — T454X5A Adverse effect of iron and its compounds, initial encounter: Secondary | ICD-10-CM | POA: Diagnosis not present

## 2022-09-24 NOTE — Unmapped (Signed)
VIR pre procedure prep call completed. [facility] to [facility] then proceed to [facility] on 2nd floor [place] for procedure check-in.  Informed of no show/late cancellation policy. NPO guidelines reviewed. Pt OK to take sips of clear liquids with all AM meds.  Pt aware of need for driver >23 years of age able to stay throughout procedure and recovery. Made aware of visitation policy. Pt verbalized understanding. All questions answered.

## 2022-09-24 NOTE — Unmapped (Signed)
[facility]  [address]  5th Floor, [address]  [place], [place] [address]    Assessment and Plan:   [name] [name] is a 22 y.o. 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 This is also NOT an issue with oral preparations. Thus, standard treatment is for re-challenge. I would recommend trialing Ferric Derisomaltose (Monoferric) given this has been reported as one of the best tolerated formulations. Additionally, IV hydration prior to the infusion can help mitigate effects. Lastly, I would do a more extensive pre-medication regimen.Lastly, if even with this more prolonged pretreatment regimen, IV hydration, use of the best tolerated formulation, and with slow infusion of even the test dose, she does still have a reaction, then I would recommend avoiding IV iron formulations in entirity, and instead use oral iron    Iron formulation:  - Use Ferric Derisolmaltose (monoferric) with a slow rate of infusion (I would infuse no more than 0.5g/hr initially).     Pretreatment:  Antihistamines:  - I would pretreat with antihsitamines the entire WEEK prior to infusion, cetirizine 20 mg (2 tablets) twice daily.   - on the day of infusion, cetirizine 20 qAM, then IV benadryl 50mg  and famotidine 40 mg IV 30 minutes prior to infusion, then cetirizine 20 mg qPM  - Continue cetirizine 20 mg BID for 72 hours after infusion    Fluids:   - I would also start hydration 30 minutes prior to the infusion, with IV fluids     Steroids:  - I would also do a steroid pretreatment with 50mg  prednisone 13 hours prior to the infusion, 50mg  prednisone 7 hours prior to the infusion, then the solumedrol dose 40mg  (IV dose) prior to the infusion.     If reaction occurs:  - draw tryptase level during the reaction (ideally between 30-90 min of onset of reaction)    I personally spent 90 minutes face-to-face and non-face-to-face in the care of this patient, which includes all pre, intra, and post visit time on the date of service.  All documented time was specific to the E/M visit and does not include any procedures that may have been performed.    No orders of the defined types were placed in this encounter.      No follow-ups on file.    [name] Sewer, MD  Division of Allergy & Immunology    Subjective   HISTORY OF PRESENT ILLNESS:  [name] [name] is a 22 y.o. female who presented for evaluation for reactions to iron infusions. History is obtained from [name] [name] and her mom, who accompanies her to her appointment today. A thorough review of the available medical records was also performed.    [name] [name] has a history of desmoid tumor followed by Hematology-Oncology. Related to this, she has a long hx of iron-deficiency anemia as well. Unfortunately, over the last 1.5-2 years, she has struggled with tolerating iron infusions. Unable to tolerate oral iron formulations.     Tolerated Ferric gluconate numerous times. However, in March 2023, after the infusion was completed, she felt that her feet were very numb followed by nerve pain  in her legs over the course of 1.5 hours. She subsequently developed swelling of her hands and feet with rash/hives on her lower extremities. Treated in the ED with antihistamines and steorids and then sent home with course of Prednisone with improvement in 2-3 days.     Reattempted Ferric gluconate with the [facility] protocol in November 2023 and tolerated this well:   - Prednisone 50mg  at 13h, 7h, and 1h prior to infusion.  - Benadryl 50mg  at 1h prior to infusion  - Pepcid 40mg , 1h prior to infusion  - Tylenol 650mg , 1h prior to infusion     In January, reattempted ferric gluconate with prednisone 50 mg po 13 hrs, 7 hrs and 1 hr prior to her IV iron. No other meds given. After about 3/4 of the infusion, she developed generalized itching with scattered areas of erythema, no distinct hives.   --Reaction medications: Given Diphenhydramine 25mg , Famotidine 20mg , Solumedrol 125mg , Zyrtec 10mg    - Infusion not restarted. She believes symptoms waxed and waned for 2 day.     In February 2024, received Iron sucrose on 04/22/22 and received methlyprednisonlone 32 mg, tylenol, benadryl, and pepcid as premedications.  Returned on 04/25/22, received tylenol and benadryl premedication and then developed itching and redness of neck, chest, and back. Given Diphenhydramine 25mg , Famotidine 20mg , Solumedrol 125mg . Restarted infusion at half-rate with tolerance. Returned on 04/27/22, premedications: benadryl 50 mg IV, tylenol 650 mg PO, Pepcid 20 mg IV. Received 87 ml and then developed flushing and itching and treated with benadryl 25mg  IV, pepcid 20 mg IV, solu-medrol 125mg  IV. She believes this was restarted and completed at a slower infusion rate.     On March 31st, she underwent test dose with INFed 25 mg with solumedrol premedication and benadryl premedication. She did develop itching and redness of the chest and back with the test dose. After further discussion, decision made to proceed with 1 gram INFed on April 1st. She did receive both premedication with IV benadryl and pepcid as well during and after the infusion and was able to tolerate the infusion with concurrent itching managed with antihistamines.    Denies urticaria and angioedema or pruritus without medication triggers.     Past Medical History:     Past Medical History:   Diagnosis Date    Abdominal pain     Acid reflux     occas    Anesthesia complication     itching, shaking, coldness; last few surgeries have gone much better    Cancer (CMS-HCC)     Cataract of right eye COVID-19 virus infection 01/2019    Cyst of thyroid determined by ultrasound     monitoring    [name]     2 right forearm, 1 left thigh, 1 right scapula, 1 under left clavicle; multiple    Difficult intravenous access     [name] (familial adenomatous polyposis)     [name]     Gastric polyps     History of chemotherapy     last treatment approx 05/2019    History of colon polyps     History of COVID-19 01/2019    Iron deficiency anemia due to chronic blood loss     received iron infusion 11-2019    PONV (postoperative nausea and vomiting)     Rectal bleeding     Syncopal episodes     especially if becoming dehydrated       Surgeries:   Past Surgical History:   Procedure  Laterality Date    COLON SURGERY      cyroablation      cystis removal      desmoid removal      PR CLOSE ENTEROSTOMY,RESEC+ANAST N/A 10/09/2020    Procedure: ILEOSTOMY TAKEDOWN;  Surgeon: Mickle Asper, MD;  Location: OR [place];  Service: General Surgery    PR COLONOSCOPY W/BIOPSY SINGLE/MULTIPLE N/A 10/27/2012    Procedure: COLONOSCOPY, FLEXIBLE, PROXIMAL TO SPLENIC FLEXURE; WITH BIOPSY, SINGLE OR MULTIPLE;  Surgeon: Shirlyn Goltz Mir, MD;  Location: PEDS PROCEDURE ROOM [facility];  Service: Gastroenterology    PR COLONOSCOPY W/BIOPSY SINGLE/MULTIPLE N/A 09/14/2013    Procedure: COLONOSCOPY, FLEXIBLE, PROXIMAL TO SPLENIC FLEXURE; WITH BIOPSY, SINGLE OR MULTIPLE;  Surgeon: Shirlyn Goltz Mir, MD;  Location: PEDS PROCEDURE ROOM [facility];  Service: Gastroenterology    PR COLONOSCOPY W/BIOPSY SINGLE/MULTIPLE N/A 11/08/2014    Procedure: COLONOSCOPY, FLEXIBLE, PROXIMAL TO SPLENIC FLEXURE; WITH BIOPSY, SINGLE OR MULTIPLE;  Surgeon: Arnold Long Mir, MD;  Location: PEDS PROCEDURE ROOM [facility];  Service: Gastroenterology    PR COLONOSCOPY W/BIOPSY SINGLE/MULTIPLE N/A 12/26/2015    Procedure: COLONOSCOPY, FLEXIBLE, PROXIMAL TO SPLENIC FLEXURE; WITH BIOPSY, SINGLE OR MULTIPLE;  Surgeon: Arnold Long Mir, MD;  Location: PEDS PROCEDURE ROOM [facility];  Service: Gastroenterology    PR COLONOSCOPY W/BIOPSY SINGLE/MULTIPLE N/A 09/02/2017    Procedure: COLONOSCOPY, FLEXIBLE, PROXIMAL TO SPLENIC FLEXURE; WITH BIOPSY, SINGLE OR MULTIPLE;  Surgeon: Arnold Long Mir, MD;  Location: PEDS PROCEDURE ROOM [place];  Service: Gastroenterology    PR COLSC FLX W/REMOVAL LESION BY HOT BX FORCEPS N/A 08/27/2016    Procedure: COLONOSCOPY, FLEXIBLE, PROXIMAL TO SPLENIC FLEXURE; W/REMOVAL TUMOR/POLYP/OTHER LESION, HOT BX FORCEP/CAUTE;  Surgeon: Arnold Long Mir, MD;  Location: PEDS PROCEDURE ROOM [facility];  Service: Gastroenterology    PR COLSC FLX W/RMVL OF TUMOR POLYP LESION SNARE TQ N/A 02/25/2019    Procedure: COLONOSCOPY FLEX; W/REMOV TUMOR/LES BY SNARE;  Surgeon: Helyn Numbers, MD;  [facility]: [place] [facility] [place] [facility];  Service: Gastroenterology    PR COLSC FLX W/RMVL OF TUMOR POLYP LESION SNARE TQ N/A 03/13/2020    Procedure: COLONOSCOPY FLEX; W/REMOV TUMOR/LES BY SNARE;  Surgeon: Helyn Numbers, MD;  Location: [facility] [place] [facility];  Service: Gastroenterology    PR EXC SKIN BENIG 2.1-3 CM TRUNK,ARM,LEG Right 02/25/2017    Procedure: EXCISION, BENIGN LESION INCLUDE MARGINS, EXCEPT SKIN TAG, LEGS; EXCISED DIAMETER 2.1 TO 3.0 CM;  Surgeon: Clarene Duke, MD;  Location: CHILDRENS OR [facility];  Service: Plastics    PR EXC SKIN BENIG 3.1-4 CM TRUNK,ARM,LEG Right 02/25/2017    Procedure: EXCISION, BENIGN LESION INCLUDE MARGINS, EXCEPT SKIN TAG, ARMS; EXCISED DIAMETER 3.1 TO 4.0 CM;  Surgeon: Clarene Duke, MD;  Location: CHILDRENS OR [facility];  Service: Plastics    PR EXC SKIN BENIG >4 CM FACE,FACIAL Right 02/25/2017    Procedure: EXCISION, OTHER BENIGN LES INCLUD MARGIN, FACE/EARS/EYELIDS/NOSE/LIPS/MUCOUS MEMBRANE; EXCISED DIAM >4.0 CM;  Surgeon: Clarene Duke, MD;  Location: CHILDRENS OR [facility];  Service: Plastics    PR EXC TUMOR SOFT TISSUE LEG/ANKLE SUBQ 3+CM Right 08/05/2019    Procedure: EXCISION, TUMOR, SOFT TISSUE OF LEG OR ANKLE AREA, SUBCUTANEOUS; 3 CM OR GREATER;  Surgeon: Arsenio Katz, MD;  Location: MAIN OR [place];  Service: Plastics    PR EXC TUMOR SOFT TISSUE LEG/ANKLE SUBQ <3CM Right 08/05/2019    Procedure: EXCISION, TUMOR, SOFT TISSUE OF LEG OR ANKLE AREA, SUBCUTANEOUS; LESS THAN 3 CM;  Surgeon: Arsenio Katz, MD;  Location: MAIN OR [facility];  Service: Plastics  PR LAP, SURG PROCTECTOMY W J-POUCH N/A 08/10/2020    Procedure: ROBOTIC ASSISTED LAPAROSCOPIC PROCTOCOLECTOMY, ILEAL J POUCH, WITH OSTOMY;  Surgeon: Mickle Asper, MD;  Location: OR [place];  Service: General Surgery    PR NDSC EVAL INTSTINAL POUCH DX W/COLLJ SPEC SPX N/A 01/23/2021    Procedure: ENDO EVAL SM INTEST POUCH; DX;  Surgeon: Modena Nunnery, MD;  [facility] [place] [facility];  Service: Gastroenterology    PR NDSC EVAL INTSTINAL POUCH DX W/COLLJ SPEC SPX N/A 08/27/2021    Procedure: ENDO EVAL SM INTEST POUCH; DX;  Surgeon: Hunt Oris, MD;  Location: [facility] PROC[facility]URES [facility];  Service: Gastroenterology    PR NDSC EVAL INTSTINAL POUCH DX W/COLLJ SPEC SPX N/A 12/09/2021    Procedure: ENDO EVAL SM INTEST POUCH; DX;  Surgeon: Vidal Schwalbe, MD;  Location: GI PROCEDURES [facility];  Service: Gastroenterology    PR NDSC EVAL INTSTINAL POUCH DX W/COLLJ [facility] SPX Left 04/09/2022    Procedure: ENDO EVAL SM INTEST POUCH; DX;  Surgeon: Modena Nunnery, MD;  Location: [place] [facility]ED[facility] [place] [facility];  Service: Gastroenterology    PR NDSC EVAL INTSTINAL POUCH DX W/COLLJ SPEC SPX N/A 08/05/2022    Procedure: ENDO EVAL SM INTEST POUCH; DX;  Surgeon: Modena Nunnery, MD;  Location: [facility] PROC[facility]URES [facility];  Service: Gastroenterology    PR NDSC EVAL INTSTINAL POUCH W/BX SINGLE/MULTIPLE N/A 01/20/2022    Procedure: ENDOSCOPIC EVAL OF SMALL INTESTINAL POUCH; DIAGNOSTIC, No biopsies;  Surgeon: Andrey Farmer, MD;  Location: GI PROCEDURES [facility];  Service: Gastroenterology    PR NDSC EVAL INTSTINAL POUCH W/BX SINGLE/MULTIPLE N/A 02/13/2022    Procedure: ENDOSCOPIC EVAL OF SMALL INTESTINAL POUCH; DIAGNOSTIC, WITH BIOPSY;  Surgeon: Bronson Curb, MD;  [facility]: [facility]URES [facility];  Service: Gastroenterology    PR UNLISTED PROCEDURE SMALL INTESTINE  01/23/2021    Procedure: UNLISTED PROCEDURE, SMALL INTESTINE;  Surgeon: Modena Nunnery, MD;  Location: [facility] [place] [facility];  Service: Gastroenterology    PR UNLISTED PROCEDURE SMALL INTESTINE  02/13/2022    Procedure: UNLISTED PROCEDURE, SMALL INTESTINE;  Surgeon: Bronson Curb, MD;  Location: GI PROCEDURES [facility];  Service: Gastroenterology    PR UPPER GI ENDOSCOPY,BIOPSY N/A 10/27/2012    Procedure: UGI ENDOSCOPY; WITH BIOPSY, SINGLE OR MULTIPLE;  Surgeon: Shirlyn Goltz Mir, MD;  Location: PEDS PROCEDURE ROOM [facility];  Service: Gastroenterology    PR UPPER GI ENDOSCOPY,BIOPSY N/A 09/14/2013    Procedure: UGI ENDOSCOPY; WITH BIOPSY, SINGLE OR MULTIPLE;  Surgeon: Shirlyn Goltz Mir, MD;  Location: PEDS PROCEDURE ROOM [facility];  Service: Gastroenterology    PR UPPER GI ENDOSCOPY,BIOPSY N/A 11/08/2014    Procedure: UGI ENDOSCOPY; WITH BIOPSY, SINGLE OR MULTIPLE;  Surgeon: Arnold Long Mir, MD;  Location: PEDS PROCEDURE ROOM [facility];  Service: Gastroenterology    PR UPPER GI ENDOSCOPY,BIOPSY N/A 12/26/2015    Procedure: UGI ENDOSCOPY; WITH BIOPSY, SINGLE OR MULTIPLE;  Surgeon: Arnold Long Mir, MD;  Location: PEDS PROCEDURE ROOM [facility];  Service: Gastroenterology    PR UPPER GI ENDOSCOPY,BIOPSY N/A 08/27/2016    Procedure: UGI ENDOSCOPY; WITH BIOPSY, SINGLE OR MULTIPLE;  Surgeon: Arnold Long Mir, MD;  Location: PEDS PROCEDURE ROOM [facility];  Service: Gastroenterology    PR UPPER GI ENDOSCOPY,BIOPSY N/A 09/02/2017    Procedure: UGI ENDOSCOPY; WITH BIOPSY, SINGLE OR MULTIPLE;  Surgeon: Arnold Long Mir, MD;  Location: PEDS PROCEDURE ROOM [facility];  Service: Gastroenterology    PR UPPER GI ENDOSCOPY,BIOPSY N/A 03/13/2020    Procedure: UGI  ENDOSCOPY; WITH BIOPSY, SINGLE OR MULTIPLE; Surgeon: Helyn Numbers, MD;  Location: [facility] PROCEDURES [place] [facility];  Service: Gastroenterology    PR UPPER GI ENDOSCOPY,BIOPSY N/A 09/05/2021    Procedure: UGI ENDOSCOPY; WITH BIOPSY, SINGLE OR MULTIPLE;  Surgeon: Wendall Papa, MD;  Location: GI PROCEDURES [facility];  Service: Gastroenterology    PR UPPER GI ENDOSCOPY,DIAGNOSIS N/A 01/20/2022    Procedure: UGI ENDO, INCLUDE ESOPHAGUS, STOMACH, & DUODENUM &/OR JEJUNUM; DX W/WO COLLECTION SPECIMN, BY BRUSH OR WASH;  Surgeon: Andrey Farmer, MD;  Location: GI PROCEDURES [facility];  Service: Gastroenterology    TUMOR REMOVAL      multiple-head, neck, back, hand, right flank, multiple       Medications:     No current facility-administered medications for this visit.     No current outpatient medications on file.     Facility-Administered Medications Ordered in Other Visits   Medication Dose Route Frequency Provider Last Rate Last Admin    acetaminophen (TYLENOL) tablet 1,000 mg  1,000 mg Oral Q8H Mabry, Rosalyn Charters, AGNP   1,000 mg at 09/26/22 0315    acetaminophen (TYLENOL) tablet 650 mg  650 mg Oral Q4H PRN Terri Piedra, AGNP        albuterol 2.5 mg /3 mL (0.083 %) nebulizer solution             baclofen (LIORESAL) tablet 5 mg  5 mg Oral TID PRN Terri Piedra, AGNP   5 mg at 09/25/22 1930    buprenorphine-naloxone (SUBOXONE) 2-0.5 mg SL film 0.5 mg of buprenorphine  0.25 Film Sublingual TID Terri Piedra, AGNP   0.5 mg of buprenorphine at 09/26/22 1237    calcium carbonate (TUMS) chewable tablet 400 mg elem calcium  400 mg elem calcium Oral Daily PRN Terri Piedra, AGNP        [Provider Hold] cefdinir (OMNICEF) capsule 300 mg  300 mg Oral Q12H [facility] Rocco Serene, MD        cefepime (MAXIPIME) 2 g in sodium chloride 0.9 % (NS) 100 mL IVPB-MBP  2 g Intravenous [facility] Rocco Serene, MD 200 mL/hr at 09/26/22 1237 2 g at 09/26/22 1237    [Provider Hold] doxycycline (VIBRA-TABS) tablet 100 mg  100 mg Oral BID Rocco Serene, MD        famotidine (PEPCID) tablet 20 mg  20 mg Oral Nightly Terri Piedra, AGNP   20 mg at 09/25/22 2036    HYDROmorphone (PF) injection Syrg 0.5 mg  0.5 mg Intravenous Q3H PRN Terri Piedra, AGNP   0.5 mg at 09/26/22 1339    lactated ringers bolus 1,000 mL  1,000 mL Intravenous Once Rocco Serene, MD   1,000 mL at 09/26/22 1244    lidocaine 4 % patch 2 patch  2 patch Transdermal Daily Terri Piedra, AGNP   2 patch at 09/25/22 1925    linezolid in dextrose 5% (ZYVOX) 600 mg/300 mL IVPB 600 mg  600 mg Intravenous Q12H [facility] Rocco Serene, MD 300 mL/hr at 09/26/22 1244 600 mg at 09/26/22 1244    LORazepam (ATIVAN) tablet 0.5 mg  0.5 mg Oral TID PRN Terri Piedra, AGNP   0.5 mg at 09/26/22 1208    melatonin tablet 3 mg  3 mg Oral Nightly PRN Terri Piedra, AGNP        mirtazapine (REMERON) tablet 15 mg  15 mg Oral Nightly Terri Piedra, AGNP   15 mg at  09/25/22 2225    ondansetron (ZOFRAN) injection 4 mg  4 mg Intravenous Q6H PRN Terri Piedra, AGNP   4 mg at 09/26/22 1014    oxyCODONE (ROXICODONE) immediate release tablet 15 mg  15 mg Oral Q4H PRN Terri Piedra, AGNP   15 mg at 09/26/22 1207    pantoprazole (Protonix) EC tablet 40 mg  40 mg Oral Daily Terri Piedra, AGNP        polyethylene glycol (MIRALAX) packet 17 g  17 g Oral Daily PRN Terri Piedra, AGNP        promethazine (PHENERGAN) 12.5 mg in sodium chloride (NS) 0.9 % 50 mL IVPB  12.5 mg Intravenous Q6H PRN Terri Piedra, AGNP 171 mL/hr at 09/26/22 1339 12.5 mg at 09/26/22 1339       Allergies:     Allergies   Allergen Reactions    Adhesive Tape-Silicones Hives and Rash     Paper tape  And tegederm ok    Ferrlecit [Sodium Ferric Gluconat-Sucrose] Swelling and Rash    Levofloxacin Swelling and Rash     Swelling in mouth, rash,     Methylnaltrexone      Per Patient: I lost bowel control, severe abdominal cramping, and elevated BP    Neomycin Swelling     Rxn after ear drops; ear swelling    Papaya Hives    Morphine Nausea And Vomiting    Zosyn [Piperacillin-Tazobactam] Itching and Rash     Red and itchy    Compazine [Prochlorperazine] Other (See Comments)     Extreme agitation    Iron Analogues     Iron Dextran Itching     Received iron dextran 06/08/22 over 12 hours, had itching and redness/flushing during the infusion and for a couple days after. Required IV benadryl w/flares between doses and ultimately treated w/IV methylpred for 2 days.     Latex, Natural Rubber Rash       Objective:   PHYSICAL EXAM:  Vitals: BP 122/90 (BP Site: L Arm, BP Position: Sitting, BP Cuff Size: Medium)  - Pulse 97  - Ht 165.1 cm (5' 5)  - Wt 62.4 kg (137 lb 9.6 oz)  - SpO2 97%  - BMI 22.90 kg/m??   General : No apparent distress. Awake, alert, well appearing.  HEENT: Normocephalic, atraumatic. Mucous membranes are moist. No periorbital edema. Facial muscles move symmetrically.  Neck: Neck is symmetrical with trachea midline.  Eyes: PERRL. Eyelids and conjunctiva normal bilaterally.   Respiratory: Breathing is unlabored, no tachypnea.  Cardiovascular: No edema, no pallor, no cyanosis.  Abdomen: Non-distended.  Skin: No concerning rash or lesions noted on exposed skin.  Extremities: Normal range of motion observed. No peripheral edema.  Neuro: Mood and behavior appropriate for age.  Musculoskeletal: Symmetric and appropriate movements of extremities.

## 2022-09-24 NOTE — Unmapped (Signed)
VIR pre call attempt.  LM for call back.

## 2022-09-25 ENCOUNTER — Ambulatory Visit: Admit: 2022-09-25 | Payer: PRIVATE HEALTH INSURANCE

## 2022-09-25 ENCOUNTER — Ambulatory Visit: Admit: 2022-09-25 | Discharge: 2022-11-19 | Payer: PRIVATE HEALTH INSURANCE

## 2022-09-25 ENCOUNTER — Encounter
Admit: 2022-09-25 | Payer: PRIVATE HEALTH INSURANCE | Attending: Student in an Organized Health Care Education/Training Program

## 2022-09-25 ENCOUNTER — Encounter: Admit: 2022-09-25 | Payer: PRIVATE HEALTH INSURANCE | Attending: Anesthesiology | Primary: Anesthesiology

## 2022-09-25 ENCOUNTER — Ambulatory Visit
Admit: 2022-09-25 | Discharge: 2022-11-19 | Disposition: A | Discharge status: Rehabilitation Facility | Payer: PRIVATE HEALTH INSURANCE | Admitting: Student in an Organized Health Care Education/Training Program

## 2022-09-25 ENCOUNTER — Encounter: Admit: 2022-09-25 | Payer: PRIVATE HEALTH INSURANCE

## 2022-09-25 ENCOUNTER — Encounter: Admit: 2022-09-25 | Payer: PRIVATE HEALTH INSURANCE | Attending: Certified Registered"

## 2022-09-25 DIAGNOSIS — Z8669 Personal history of other diseases of the nervous system and sense organs: Secondary | ICD-10-CM | POA: Diagnosis not present

## 2022-09-25 DIAGNOSIS — D1391 Familial adenomatous polyposis: Secondary | ICD-10-CM | POA: Diagnosis not present

## 2022-09-25 DIAGNOSIS — Z0389 Encounter for observation for other suspected diseases and conditions ruled out: Secondary | ICD-10-CM | POA: Diagnosis not present

## 2022-09-25 DIAGNOSIS — N3289 Other specified disorders of bladder: Secondary | ICD-10-CM | POA: Diagnosis not present

## 2022-09-25 DIAGNOSIS — F339 Major depressive disorder, recurrent, unspecified: Secondary | ICD-10-CM | POA: Diagnosis not present

## 2022-09-25 DIAGNOSIS — R112 Nausea with vomiting, unspecified: Secondary | ICD-10-CM | POA: Diagnosis not present

## 2022-09-25 DIAGNOSIS — M7989 Other specified soft tissue disorders: Secondary | ICD-10-CM | POA: Diagnosis not present

## 2022-09-25 DIAGNOSIS — K922 Gastrointestinal hemorrhage, unspecified: Secondary | ICD-10-CM | POA: Diagnosis not present

## 2022-09-25 DIAGNOSIS — R1084 Generalized abdominal pain: Secondary | ICD-10-CM | POA: Diagnosis not present

## 2022-09-25 DIAGNOSIS — Z1152 Encounter for screening for COVID-19: Secondary | ICD-10-CM | POA: Diagnosis not present

## 2022-09-25 DIAGNOSIS — R110 Nausea: Secondary | ICD-10-CM | POA: Diagnosis not present

## 2022-09-25 DIAGNOSIS — D48115 Desmoid tumor of upper extremity and shoulder girdle: Secondary | ICD-10-CM | POA: Diagnosis not present

## 2022-09-25 DIAGNOSIS — M549 Dorsalgia, unspecified: Secondary | ICD-10-CM | POA: Diagnosis not present

## 2022-09-25 DIAGNOSIS — B49 Unspecified mycosis: Secondary | ICD-10-CM | POA: Diagnosis not present

## 2022-09-25 DIAGNOSIS — G8928 Other chronic postprocedural pain: Secondary | ICD-10-CM | POA: Diagnosis not present

## 2022-09-25 DIAGNOSIS — D48111 Desmoid tumor of chest wall: Secondary | ICD-10-CM | POA: Diagnosis not present

## 2022-09-25 DIAGNOSIS — R531 Weakness: Secondary | ICD-10-CM | POA: Diagnosis not present

## 2022-09-25 DIAGNOSIS — D48113 Desmoid tumor of abdominal wall: Secondary | ICD-10-CM | POA: Diagnosis not present

## 2022-09-25 DIAGNOSIS — R638 Other symptoms and signs concerning food and fluid intake: Secondary | ICD-10-CM | POA: Diagnosis not present

## 2022-09-25 DIAGNOSIS — G8918 Other acute postprocedural pain: Secondary | ICD-10-CM | POA: Diagnosis not present

## 2022-09-25 DIAGNOSIS — R5381 Other malaise: Secondary | ICD-10-CM | POA: Diagnosis not present

## 2022-09-25 DIAGNOSIS — R0602 Shortness of breath: Secondary | ICD-10-CM | POA: Diagnosis not present

## 2022-09-25 DIAGNOSIS — B9689 Other specified bacterial agents as the cause of diseases classified elsewhere: Secondary | ICD-10-CM | POA: Diagnosis not present

## 2022-09-25 DIAGNOSIS — R258 Other abnormal involuntary movements: Secondary | ICD-10-CM | POA: Diagnosis not present

## 2022-09-25 DIAGNOSIS — R519 Headache, unspecified: Secondary | ICD-10-CM | POA: Diagnosis not present

## 2022-09-25 DIAGNOSIS — A689 Relapsing fever, unspecified: Secondary | ICD-10-CM | POA: Diagnosis not present

## 2022-09-25 DIAGNOSIS — I38 Endocarditis, valve unspecified: Secondary | ICD-10-CM | POA: Diagnosis not present

## 2022-09-25 DIAGNOSIS — R059 Cough, unspecified: Secondary | ICD-10-CM | POA: Diagnosis not present

## 2022-09-25 DIAGNOSIS — F39 Unspecified mood [affective] disorder: Secondary | ICD-10-CM | POA: Diagnosis not present

## 2022-09-25 DIAGNOSIS — F419 Anxiety disorder, unspecified: Secondary | ICD-10-CM | POA: Diagnosis not present

## 2022-09-25 DIAGNOSIS — Z4682 Encounter for fitting and adjustment of non-vascular catheter: Secondary | ICD-10-CM | POA: Diagnosis not present

## 2022-09-25 DIAGNOSIS — M25552 Pain in left hip: Secondary | ICD-10-CM | POA: Diagnosis not present

## 2022-09-25 DIAGNOSIS — I081 Rheumatic disorders of both mitral and tricuspid valves: Secondary | ICD-10-CM | POA: Diagnosis not present

## 2022-09-25 DIAGNOSIS — F329 Major depressive disorder, single episode, unspecified: Secondary | ICD-10-CM | POA: Diagnosis not present

## 2022-09-25 DIAGNOSIS — D48119 Desmoid tumor of unspecified site: Secondary | ICD-10-CM | POA: Diagnosis not present

## 2022-09-25 DIAGNOSIS — R7401 Elevation of levels of liver transaminase levels: Secondary | ICD-10-CM | POA: Diagnosis not present

## 2022-09-25 DIAGNOSIS — M6281 Muscle weakness (generalized): Secondary | ICD-10-CM | POA: Diagnosis not present

## 2022-09-25 DIAGNOSIS — R079 Chest pain, unspecified: Secondary | ICD-10-CM | POA: Diagnosis not present

## 2022-09-25 DIAGNOSIS — M62838 Other muscle spasm: Secondary | ICD-10-CM | POA: Diagnosis not present

## 2022-09-25 DIAGNOSIS — M25521 Pain in right elbow: Secondary | ICD-10-CM | POA: Diagnosis not present

## 2022-09-25 DIAGNOSIS — K6389 Other specified diseases of intestine: Secondary | ICD-10-CM | POA: Diagnosis not present

## 2022-09-25 DIAGNOSIS — M6289 Other specified disorders of muscle: Secondary | ICD-10-CM | POA: Diagnosis not present

## 2022-09-25 DIAGNOSIS — R7881 Bacteremia: Secondary | ICD-10-CM | POA: Diagnosis not present

## 2022-09-25 DIAGNOSIS — D48110 Desmoid tumor of head and neck: Secondary | ICD-10-CM | POA: Diagnosis not present

## 2022-09-25 DIAGNOSIS — T80211A Bloodstream infection due to central venous catheter, initial encounter: Secondary | ICD-10-CM | POA: Diagnosis not present

## 2022-09-25 DIAGNOSIS — G248 Other dystonia: Secondary | ICD-10-CM | POA: Diagnosis not present

## 2022-09-25 DIAGNOSIS — J439 Emphysema, unspecified: Secondary | ICD-10-CM | POA: Diagnosis not present

## 2022-09-25 DIAGNOSIS — H53001 Unspecified amblyopia, right eye: Secondary | ICD-10-CM | POA: Diagnosis not present

## 2022-09-25 DIAGNOSIS — M25561 Pain in right knee: Secondary | ICD-10-CM | POA: Diagnosis not present

## 2022-09-25 DIAGNOSIS — Z79891 Long term (current) use of opiate analgesic: Secondary | ICD-10-CM | POA: Diagnosis not present

## 2022-09-25 DIAGNOSIS — R55 Syncope and collapse: Secondary | ICD-10-CM | POA: Diagnosis not present

## 2022-09-25 DIAGNOSIS — M47812 Spondylosis without myelopathy or radiculopathy, cervical region: Secondary | ICD-10-CM | POA: Diagnosis not present

## 2022-09-25 DIAGNOSIS — B952 Enterococcus as the cause of diseases classified elsewhere: Secondary | ICD-10-CM | POA: Diagnosis not present

## 2022-09-25 DIAGNOSIS — R1031 Right lower quadrant pain: Secondary | ICD-10-CM | POA: Diagnosis not present

## 2022-09-25 DIAGNOSIS — K920 Hematemesis: Secondary | ICD-10-CM | POA: Diagnosis not present

## 2022-09-25 DIAGNOSIS — B377 Candidal sepsis: Secondary | ICD-10-CM | POA: Diagnosis not present

## 2022-09-25 DIAGNOSIS — F410 Panic disorder [episodic paroxysmal anxiety] without agoraphobia: Secondary | ICD-10-CM | POA: Diagnosis not present

## 2022-09-25 DIAGNOSIS — F32A Depression, unspecified: Secondary | ICD-10-CM | POA: Diagnosis not present

## 2022-09-25 DIAGNOSIS — R7989 Other specified abnormal findings of blood chemistry: Secondary | ICD-10-CM | POA: Diagnosis not present

## 2022-09-25 DIAGNOSIS — R100 Acute abdomen: Secondary | ICD-10-CM | POA: Diagnosis not present

## 2022-09-25 DIAGNOSIS — Z452 Encounter for adjustment and management of vascular access device: Secondary | ICD-10-CM | POA: Diagnosis not present

## 2022-09-25 DIAGNOSIS — Z7401 Bed confinement status: Secondary | ICD-10-CM | POA: Diagnosis not present

## 2022-09-25 DIAGNOSIS — S39092D Other injury of muscle, fascia and tendon of lower back, subsequent encounter: Secondary | ICD-10-CM | POA: Diagnosis not present

## 2022-09-25 DIAGNOSIS — K560 Paralytic ileus: Secondary | ICD-10-CM | POA: Diagnosis not present

## 2022-09-25 DIAGNOSIS — K219 Gastro-esophageal reflux disease without esophagitis: Secondary | ICD-10-CM | POA: Diagnosis not present

## 2022-09-25 DIAGNOSIS — M25531 Pain in right wrist: Secondary | ICD-10-CM | POA: Diagnosis not present

## 2022-09-25 DIAGNOSIS — R109 Unspecified abdominal pain: Secondary | ICD-10-CM | POA: Diagnosis not present

## 2022-09-25 DIAGNOSIS — K567 Ileus, unspecified: Secondary | ICD-10-CM | POA: Diagnosis not present

## 2022-09-25 DIAGNOSIS — B3789 Other sites of candidiasis: Secondary | ICD-10-CM | POA: Diagnosis not present

## 2022-09-25 DIAGNOSIS — R252 Cramp and spasm: Secondary | ICD-10-CM | POA: Diagnosis not present

## 2022-09-25 DIAGNOSIS — E872 Acidosis, unspecified: Secondary | ICD-10-CM | POA: Diagnosis not present

## 2022-09-25 DIAGNOSIS — R935 Abnormal findings on diagnostic imaging of other abdominal regions, including retroperitoneum: Secondary | ICD-10-CM | POA: Diagnosis not present

## 2022-09-25 DIAGNOSIS — K5903 Drug induced constipation: Secondary | ICD-10-CM | POA: Diagnosis not present

## 2022-09-25 DIAGNOSIS — M25562 Pain in left knee: Secondary | ICD-10-CM | POA: Diagnosis not present

## 2022-09-25 DIAGNOSIS — M545 Low back pain, unspecified: Secondary | ICD-10-CM | POA: Diagnosis not present

## 2022-09-25 DIAGNOSIS — R635 Abnormal weight gain: Secondary | ICD-10-CM | POA: Diagnosis not present

## 2022-09-25 DIAGNOSIS — K659 Peritonitis, unspecified: Secondary | ICD-10-CM | POA: Diagnosis not present

## 2022-09-25 DIAGNOSIS — M79631 Pain in right forearm: Secondary | ICD-10-CM | POA: Diagnosis not present

## 2022-09-25 DIAGNOSIS — D509 Iron deficiency anemia, unspecified: Secondary | ICD-10-CM | POA: Diagnosis not present

## 2022-09-25 DIAGNOSIS — D126 Benign neoplasm of colon, unspecified: Secondary | ICD-10-CM | POA: Diagnosis not present

## 2022-09-25 DIAGNOSIS — M25551 Pain in right hip: Secondary | ICD-10-CM | POA: Diagnosis not present

## 2022-09-25 DIAGNOSIS — K429 Umbilical hernia without obstruction or gangrene: Secondary | ICD-10-CM | POA: Diagnosis not present

## 2022-09-25 DIAGNOSIS — D4819 Other specified neoplasm of uncertain behavior of connective and other soft tissue: Secondary | ICD-10-CM | POA: Diagnosis not present

## 2022-09-25 DIAGNOSIS — E46 Unspecified protein-calorie malnutrition: Secondary | ICD-10-CM | POA: Diagnosis not present

## 2022-09-25 DIAGNOSIS — R52 Pain, unspecified: Secondary | ICD-10-CM | POA: Diagnosis not present

## 2022-09-25 DIAGNOSIS — B379 Candidiasis, unspecified: Secondary | ICD-10-CM | POA: Diagnosis not present

## 2022-09-25 DIAGNOSIS — E8721 Acute metabolic acidosis: Secondary | ICD-10-CM | POA: Diagnosis not present

## 2022-09-25 DIAGNOSIS — T827XXA Infection and inflammatory reaction due to other cardiac and vascular devices, implants and grafts, initial encounter: Secondary | ICD-10-CM | POA: Diagnosis not present

## 2022-09-25 DIAGNOSIS — R1903 Right lower quadrant abdominal swelling, mass and lump: Secondary | ICD-10-CM | POA: Diagnosis not present

## 2022-09-25 DIAGNOSIS — K9189 Other postprocedural complications and disorders of digestive system: Secondary | ICD-10-CM | POA: Diagnosis not present

## 2022-09-25 DIAGNOSIS — R000 Tachycardia, unspecified: Secondary | ICD-10-CM | POA: Diagnosis not present

## 2022-09-25 DIAGNOSIS — G893 Neoplasm related pain (acute) (chronic): Secondary | ICD-10-CM | POA: Diagnosis not present

## 2022-09-25 DIAGNOSIS — G8929 Other chronic pain: Secondary | ICD-10-CM | POA: Diagnosis not present

## 2022-09-25 DIAGNOSIS — D48114 Desmoid tumor, intraabdominal: Secondary | ICD-10-CM | POA: Diagnosis not present

## 2022-09-25 DIAGNOSIS — G479 Sleep disorder, unspecified: Secondary | ICD-10-CM | POA: Diagnosis not present

## 2022-09-25 DIAGNOSIS — R279 Unspecified lack of coordination: Secondary | ICD-10-CM | POA: Diagnosis not present

## 2022-09-25 DIAGNOSIS — R1030 Lower abdominal pain, unspecified: Secondary | ICD-10-CM | POA: Diagnosis not present

## 2022-09-25 DIAGNOSIS — K254 Chronic or unspecified gastric ulcer with hemorrhage: Secondary | ICD-10-CM | POA: Diagnosis not present

## 2022-09-25 DIAGNOSIS — F411 Generalized anxiety disorder: Secondary | ICD-10-CM | POA: Diagnosis not present

## 2022-09-25 DIAGNOSIS — K638219 Small intestinal bacterial overgrowth, unspecified: Secondary | ICD-10-CM | POA: Diagnosis not present

## 2022-09-25 DIAGNOSIS — M4802 Spinal stenosis, cervical region: Secondary | ICD-10-CM | POA: Diagnosis not present

## 2022-09-25 DIAGNOSIS — G252 Other specified forms of tremor: Secondary | ICD-10-CM | POA: Diagnosis not present

## 2022-09-25 DIAGNOSIS — R509 Fever, unspecified: Secondary | ICD-10-CM | POA: Diagnosis not present

## 2022-09-25 DIAGNOSIS — D692 Other nonthrombocytopenic purpura: Secondary | ICD-10-CM | POA: Diagnosis not present

## 2022-09-25 MED ADMIN — promethazine (PHENERGAN) 12.5 mg in sodium chloride (NS) 0.9 % 50 mL IVPB: 12.5 mg | INTRAVENOUS | @ 19:00:00 | Stop: 2022-09-25 | NDC 60977000243

## 2022-09-25 MED ADMIN — propofol (DIPRIVAN) infusion 10 mg/mL: INTRAVENOUS | @ 17:00:00 | Stop: 2022-09-25 | NDC 72572061210

## 2022-09-25 MED ADMIN — phenylephrine 1 mg/10 mL (100 mcg/mL) injection Syrg: INTRAVENOUS | @ 17:00:00 | Stop: 2022-09-25 | NDC 55081114500

## 2022-09-25 MED ADMIN — acetaminophen (TYLENOL) tablet 1,000 mg: 1000 mg | ORAL | NDC 50580048790

## 2022-09-25 MED ADMIN — lidocaine 4 % patch 2 patch: 2 | TRANSDERMAL | @ 23:00:00 | NDC 63323047930

## 2022-09-25 MED ADMIN — fentaNYL (PF) (SUBLIMAZE) injection: INTRAVENOUS | @ 18:00:00 | Stop: 2022-09-25 | NDC 50458002005

## 2022-09-25 MED ADMIN — HYDROmorphone (PF) (DILAUDID) injection 1 mg: 1 mg | INTRAVENOUS | @ 20:00:00 | Stop: 2022-09-25 | NDC 60687056686

## 2022-09-25 MED ADMIN — meperidine (DEMEROL) injection 12.5 mg: 12.5 mg | INTRAVENOUS | @ 18:00:00 | Stop: 2022-09-25 | NDC 55175449701

## 2022-09-25 MED ADMIN — baclofen (LIORESAL) tablet 5 mg: 5 mg | ORAL | NDC 70257056320

## 2022-09-25 MED ADMIN — HYDROmorphone (PF) (DILAUDID) injection 0.5 mg: .5 mg | INTRAVENOUS | @ 23:00:00 | Stop: 2022-09-25 | NDC 00409426411

## 2022-09-25 MED ADMIN — acetaminophen (TYLENOL) tablet 500 mg: 500 mg | ORAL | @ 20:00:00 | Stop: 2022-09-25 | NDC 50580020904

## 2022-09-25 MED ADMIN — phenylephrine 1 mg/10 mL (100 mcg/mL) injection Syrg: INTRAVENOUS | @ 18:00:00 | Stop: 2022-09-25 | NDC 55081114500

## 2022-09-25 MED ADMIN — lactated Ringers infusion: INTRAVENOUS | @ 16:00:00 | Stop: 2022-09-25 | NDC 11845118709

## 2022-09-25 MED ADMIN — ondansetron (ZOFRAN) injection 4 mg: 4 mg | INTRAVENOUS | @ 23:00:00 | NDC 71930001752

## 2022-09-25 MED ADMIN — midazolam (VERSED) injection: INTRAVENOUS | @ 16:00:00 | Stop: 2022-09-25 | NDC 61703032173

## 2022-09-25 MED ADMIN — fentaNYL (PF) (SUBLIMAZE) injection 25 mcg: 25 ug | INTRAVENOUS | @ 19:00:00 | Stop: 2022-09-25 | NDC 68258302301

## 2022-09-25 MED ADMIN — Propofol (DIPRIVAN) injection: INTRAVENOUS | @ 18:00:00 | Stop: 2022-09-25 | NDC 72572061210

## 2022-09-25 MED ADMIN — acetaminophen (OFIRMEV) 10 mg/mL injection: INTRAVENOUS | @ 17:00:00 | Stop: 2022-09-25 | NDC 68094059462

## 2022-09-25 MED ADMIN — Propofol (DIPRIVAN) injection: INTRAVENOUS | @ 17:00:00 | Stop: 2022-09-25 | NDC 72572061210

## 2022-09-25 MED ADMIN — LORazepam (ATIVAN) tablet 0.5 mg: .5 mg | ORAL | @ 23:00:00 | NDC 76420064790

## 2022-09-25 MED ADMIN — ePHEDrine (PF) 25 mg/5 mL (5 mg/mL) in 0.9% sodium chloride syringe Syrg: INTRAVENOUS | @ 18:00:00 | Stop: 2022-09-25 | NDC 50930028150

## 2022-09-25 MED ADMIN — ePHEDrine (PF) 25 mg/5 mL (5 mg/mL) in 0.9% sodium chloride syringe Syrg: INTRAVENOUS | @ 17:00:00 | Stop: 2022-09-25 | NDC 50930028150

## 2022-09-25 MED ADMIN — HYDROmorphone (PF) (DILAUDID) injection 1 mg: 1 mg | INTRAVENOUS | @ 18:00:00 | Stop: 2022-09-25 | NDC 60687056686

## 2022-09-25 MED ADMIN — ondansetron (ZOFRAN) injection: INTRAVENOUS | @ 17:00:00 | Stop: 2022-09-25 | NDC 62756018101

## 2022-09-25 MED ADMIN — oxyCODONE (ROXICODONE) immediate release tablet 5 mg: 5 mg | ORAL | @ 20:00:00 | Stop: 2022-09-25 | NDC 72162174902

## 2022-09-25 NOTE — Unmapped (Signed)
[place] [facility] - Operative Note     VIR Post-Procedure Note    Procedure Name: injection of desmoid tumors with steroid/anesthetic under US guidance    Pre-Op Diagnosis: [name], desmoid tumors    Post-Op Diagnosis: Same as pre-operative diagnosis    VIR Providers    Attending: Ammie Dalton, MD  Assistant: Renato Battles, MD, MD    Description of procedure: Successful injection of 2 desmoid tumors in the right forearm and one desmoid tumor in the left upper chest under US guidance. Used combination of triamcinolone and bupivicaine.    Case done with GA.    Follow up as needed with VIR for consideration of future treatments.    Estimated Blood Loss: approximately 1 mL  Complications: None    See detailed procedure note with images in PACS Corinne Ports).    The patient tolerated the procedure well without incident or complication and left the room in stable condition.    Renato Battles, MD, MD  09/25/2022 1:44 PM

## 2022-09-25 NOTE — Unmapped (Signed)
[facility] INTERVENTIONAL RADIOLOGY - Pre Procedure H/P  Patient name: [name] [name]  CSN: [id number]  MRN: [id number]  Date of Procedure: 09/25/22      Assessment/Plan:    [name] is a 22 y.o. female who will undergo injection of steroid/anesthetic into right wrist/forearm desmoid tumors in Interventional Radiology. Last treated 02/2021 by Dr. Selena Batten.    Consent obtained in the Pre Op holding area by Renato Battles.  Risks, benefits, and alternatives including but not limited to bleeding, infection, damage to nearby structures were discussed with patient/patient's representative. All questions were answered to patient/patient's representative satisfaction.  Patient/Patient's representative consents and would like to proceed with the procedure.   --The patient will accept blood products in an emergent situation.  --The patient does not have a Do Not Resuscitate order in effect.      HPI: [name] is a 22 y.o. female with hx of [name] and numerous tumors including right wrist/forearm tumors that are causing her pain and limited use of her hand.    Past Medical History:   Diagnosis Date    Abdominal pain     Acid reflux     occas    Anesthesia complication     itching, shaking, coldness; last few surgeries have gone much better    Cancer (CMS-HCC)     Cataract of right eye     COVID-19 virus infection 01/2019    Cyst of thyroid determined by ultrasound     monitoring    [name]     2 right forearm, 1 left thigh, 1 right scapula, 1 under left clavicle; multiple    Difficult intravenous access     [name] (familial adenomatous polyposis)     [name]     Gastric polyps     History of chemotherapy     last treatment approx 05/2019    History of colon polyps     History of COVID-19 01/2019    Iron deficiency anemia due to chronic blood loss     received iron infusion 11-2019    PONV (postoperative nausea and vomiting)     Rectal bleeding     Syncopal episodes     especially if becoming dehydrated       Past Surgical History:   Procedure Laterality Date    COLON SURGERY      cyroablation      cystis removal      desmoid removal      PR CLOSE ENTEROSTOMY,RESEC+ANAST N/A 10/09/2020    Procedure: ILEOSTOMY TAKEDOWN;  Surgeon: Mickle Asper, MD;  Location: OR [place];  Service: General Surgery    PR COLONOSCOPY W/BIOPSY SINGLE/MULTIPLE N/A 10/27/2012    Procedure: COLONOSCOPY, FLEXIBLE, PROXIMAL TO SPLENIC FLEXURE; WITH BIOPSY, SINGLE OR MULTIPLE;  Surgeon: Shirlyn Goltz Mir, MD;  Location: PEDS PROCEDURE ROOM [place];  Service: Gastroenterology    PR COLONOSCOPY W/BIOPSY SINGLE/MULTIPLE N/A 09/14/2013    Procedure: COLONOSCOPY, FLEXIBLE, PROXIMAL TO SPLENIC FLEXURE; WITH BIOPSY, SINGLE OR MULTIPLE;  Surgeon: Shirlyn Goltz Mir, MD;  Location: PEDS PROCEDURE ROOM [place];  Service: Gastroenterology    PR COLONOSCOPY W/BIOPSY SINGLE/MULTIPLE N/A 11/08/2014    Procedure: COLONOSCOPY, FLEXIBLE, PROXIMAL TO SPLENIC FLEXURE; WITH BIOPSY, SINGLE OR MULTIPLE;  Surgeon: Arnold Long Mir, MD;  Location: PEDS PROCEDURE ROOM [facility];  Service: Gastroenterology    PR COLONOSCOPY W/BIOPSY SINGLE/MULTIPLE N/A 12/26/2015    Procedure: COLONOSCOPY, FLEXIBLE, PROXIMAL TO SPLENIC FLEXURE; WITH BIOPSY, SINGLE OR MULTIPLE;  Surgeon: Arnold Long Mir, MD;  Location: PEDS  PROCEDURE ROOM [facility];  Service: Gastroenterology    PR COLONOSCOPY W/BIOPSY SINGLE/MULTIPLE N/A 09/02/2017    Procedure: COLONOSCOPY, FLEXIBLE, PROXIMAL TO SPLENIC FLEXURE; WITH BIOPSY, SINGLE OR MULTIPLE;  Surgeon: Arnold Long Mir, MD;  Location: PEDS PROCEDURE ROOM [facility];  Service: Gastroenterology    PR COLSC FLX W/REMOVAL LESION BY HOT BX FORCEPS N/A 08/27/2016    Procedure: COLONOSCOPY, FLEXIBLE, PROXIMAL TO SPLENIC FLEXURE; W/REMOVAL TUMOR/POLYP/OTHER LESION, HOT BX FORCEP/CAUTE;  Surgeon: Arnold Long Mir, MD;  Location: PEDS PROCEDURE ROOM [facility];  Service: Gastroenterology    PR COLSC FLX W/RMVL OF TUMOR POLYP LESION SNARE TQ N/A 02/25/2019    Procedure: COLONOSCOPY FLEX; W/REMOV TUMOR/LES BY SNARE;  Surgeon: Helyn Numbers, MD;  Location: GI PROCEDURES [facility];  Service: Gastroenterology    PR COLSC FLX W/RMVL OF TUMOR POLYP LESION SNARE TQ N/A 03/13/2020    Procedure: COLONOSCOPY FLEX; W/REMOV TUMOR/LES BY SNARE;  Surgeon: Helyn Numbers, MD;  Location: [facility] PROCEDURES [facility] [facility];  Service: Gastroenterology    PR EXC SKIN BENIG 2.1-3 CM TRUNK,ARM,LEG Right 02/25/2017    Procedure: EXCISION, BENIGN LESION INCLUDE MARGINS, EXCEPT SKIN TAG, LEGS; EXCISED DIAMETER 2.1 TO 3.0 CM;  Surgeon: Clarene Duke, MD;  Location: CHILDRENS OR [facility];  Service: Plastics    PR EXC SKIN BENIG 3.1-4 CM TRUNK,ARM,LEG Right 02/25/2017    Procedure: EXCISION, BENIGN LESION INCLUDE MARGINS, EXCEPT SKIN TAG, ARMS; EXCISED DIAMETER 3.1 TO 4.0 CM;  Surgeon: Clarene Duke, MD;  Location: CHILDRENS OR [facility];  Service: Plastics    PR EXC SKIN BENIG >4 CM FACE,FACIAL Right 02/25/2017    Procedure: EXCISION, OTHER BENIGN LES INCLUD MARGIN, FACE/EARS/EYELIDS/NOSE/LIPS/MUCOUS MEMBRANE; EXCISED DIAM >4.0 CM;  Surgeon: Clarene Duke, MD;  Location: CHILDRENS OR [facility];  Service: Plastics    PR EXC TUMOR SOFT TISSUE LEG/ANKLE SUBQ 3+CM Right 08/05/2019    Procedure: EXCISION, TUMOR, SOFT TISSUE OF LEG OR ANKLE AREA, SUBCUTANEOUS; 3 CM OR GREATER;  Surgeon: Arsenio Katz, MD;  Location: MAIN OR [place];  Service: Plastics    PR EXC TUMOR SOFT TISSUE LEG/ANKLE SUBQ <3CM Right 08/05/2019    Procedure: EXCISION, TUMOR, SOFT TISSUE OF LEG OR ANKLE AREA, SUBCUTANEOUS; LESS THAN 3 CM;  Surgeon: Arsenio Katz, MD;  Location: MAIN OR [facility];  Service: Plastics    PR LAP, SURG PROCTECTOMY W J-POUCH N/A 08/10/2020    Procedure: ROBOTIC ASSISTED LAPAROSCOPIC PROCTOCOLECTOMY, ILEAL J POUCH, WITH OSTOMY;  Surgeon: Mickle Asper, MD;  Location: OR [place];  Service: General Surgery    PR NDSC EVAL INTSTINAL POUCH DX W/COLLJ SPEC SPX N/A 01/23/2021    Procedure: ENDO EVAL SM INTEST POUCH; DX; Surgeon: Modena Nunnery, MD;  Location: [facility] PROCEDURES [facility] [facility];  Service: Gastroenterology    PR NDSC EVAL INTSTINAL POUCH DX W/COLLJ SPEC SPX N/A 08/27/2021    Procedure: ENDO EVAL SM INTEST POUCH; DX;  Surgeon: Hunt Oris, MD;  [facility]: GI PROC[facility]URES [facility];  Service: Gastroenterology    PR NDSC EVAL INTSTINAL POUCH DX W/COLLJ SPEC SPX N/A 12/09/2021    Procedure: ENDO EVAL SM INTEST POUCH; DX;  Surgeon: Vidal Schwalbe, MD;  Location: GI PROCEDURES [facility];  Service: Gastroenterology    PR NDSC EVAL INTSTINAL POUCH DX W/COLLJ [facility] SPX Left 04/09/2022    Procedure: ENDO EVAL SM INTEST POUCH; DX;  Surgeon: Modena Nunnery, MD;  Location: GI PROCEDURES [place] [facility];  Service: Gastroenterology    PR NDSC EVAL INTSTINAL POUCH DX W/COLLJ SPEC SPX N/A 08/05/2022  Procedure: ENDO EVAL SM INTEST POUCH; DX;  Surgeon: Modena Nunnery, MD;  Location: GI PROCEDURES [facility];  Service: Gastroenterology    PR NDSC EVAL INTSTINAL POUCH W/BX SINGLE/MULTIPLE N/A 01/20/2022    Procedure: ENDOSCOPIC EVAL OF SMALL INTESTINAL POUCH; DIAGNOSTIC, No biopsies;  Surgeon: Andrey Farmer, MD;  Location: GI PROCEDURES [facility];  Service: Gastroenterology    PR NDSC EVAL INTSTINAL POUCH W/BX SINGLE/MULTIPLE N/A 02/13/2022    Procedure: ENDOSCOPIC EVAL OF SMALL INTESTINAL POUCH; DIAGNOSTIC, WITH BIOPSY;  Surgeon: Bronson Curb, MD;  Location: GI PROCEDURES [facility];  Service: Gastroenterology    PR UNLISTED PROCEDURE SMALL INTESTINE  01/23/2021    Procedure: UNLISTED PROCEDURE, SMALL INTESTINE;  Surgeon: Modena Nunnery, MD;  Location: [facility] PROCEDURES [facility] [facility];  Service: Gastroenterology    PR UNLISTED PROCEDURE SMALL INTESTINE  02/13/2022    Procedure: UNLISTED PROCEDURE, SMALL INTESTINE;  Surgeon: Bronson Curb, MD;  Location: GI PROCEDURES [facility];  Service: Gastroenterology    PR UPPER GI ENDOSCOPY,BIOPSY N/A 10/27/2012 Procedure: UGI ENDOSCOPY; WITH BIOPSY, SINGLE OR MULTIPLE;  Surgeon: Shirlyn Goltz Mir, MD;  Location: PEDS PROCEDURE ROOM [facility];  Service: Gastroenterology    PR UPPER GI ENDOSCOPY,BIOPSY N/A 09/14/2013    Procedure: UGI ENDOSCOPY; WITH BIOPSY, SINGLE OR MULTIPLE;  Surgeon: Shirlyn Goltz Mir, MD;  Location: PEDS PROCEDURE ROOM [facility];  Service: Gastroenterology    PR UPPER GI ENDOSCOPY,BIOPSY N/A 11/08/2014    Procedure: UGI ENDOSCOPY; WITH BIOPSY, SINGLE OR MULTIPLE;  Surgeon: Arnold Long Mir, MD;  Location: PEDS PROCEDURE ROOM [facility];  Service: Gastroenterology    PR UPPER GI ENDOSCOPY,BIOPSY N/A 12/26/2015    Procedure: UGI ENDOSCOPY; WITH BIOPSY, SINGLE OR MULTIPLE;  Surgeon: Arnold Long Mir, MD;  Location: PEDS PROCEDURE ROOM [facility];  Service: Gastroenterology    PR UPPER GI ENDOSCOPY,BIOPSY N/A 08/27/2016    Procedure: UGI ENDOSCOPY; WITH BIOPSY, SINGLE OR MULTIPLE;  Surgeon: Arnold Long Mir, MD;  Location: PEDS PROCEDURE ROOM [facility];  Service: Gastroenterology    PR UPPER GI ENDOSCOPY,BIOPSY N/A 09/02/2017    Procedure: UGI ENDOSCOPY; WITH BIOPSY, SINGLE OR MULTIPLE;  Surgeon: Arnold Long Mir, MD;  Location: PEDS PROCEDURE ROOM [facility];  Service: Gastroenterology    PR UPPER GI ENDOSCOPY,BIOPSY N/A 03/13/2020    Procedure: UGI ENDOSCOPY; WITH BIOPSY, SINGLE OR MULTIPLE;  Surgeon: Helyn Numbers, MD;  Location: [facility]EDURES [facility] [facility];  Service: Gastroenterology    PR UPPER GI ENDOSCOPY,BIOPSY N/A 09/05/2021    Procedure: UGI ENDOSCOPY; WITH BIOPSY, SINGLE OR MULTIPLE;  Surgeon: Wendall Papa, MD;  Location: GI PROCEDURES [facility];  Service: Gastroenterology    PR UPPER GI ENDOSCOPY,DIAGNOSIS N/A 01/20/2022    Procedure: UGI ENDO, INCLUDE ESOPHAGUS, STOMACH, & DUODENUM &/OR JEJUNUM; DX W/WO COLLECTION SPECIMN, BY BRUSH OR WASH;  Surgeon: Andrey Farmer, MD;  Location: GI PROCEDURES [facility];  Service: Gastroenterology    TUMOR REMOVAL      multiple-head, neck, back, hand, right flank, multiple Allergies:   Allergies   Allergen Reactions    Adhesive Tape-Silicones Hives and Rash     Paper tape  And tegederm ok    Ferrlecit [Sodium Ferric Gluconat-Sucrose] Swelling and Rash    Levofloxacin Swelling and Rash     Swelling in mouth, rash,     Methylnaltrexone      Per Patient: I lost bowel control, severe abdominal cramping, and elevated BP    Neomycin Swelling     Rxn after ear drops; ear swelling    Papaya Hives  Morphine Nausea And Vomiting    Zosyn [Piperacillin-Tazobactam] Itching and Rash     Red and itchy    Compazine [Prochlorperazine] Other (See Comments)     Extreme agitation    Iron Analogues     Iron Dextran Itching     Received iron dextran 06/08/22 over 12 hours, had itching and redness/flushing during the infusion and for a couple days after. Required IV benadryl w/flares between doses and ultimately treated w/IV methylpred for 2 days.     Latex, Natural Rubber Rash       Medications:  No relevant medications, please see full medication list in Epic.    ASA Grade: ASA 3 - Patient with moderate systemic disease with functional limitations    PE:    Vitals:    09/25/22 1103   BP: 116/71   Pulse: 83   Resp: 16   Temp: 36.6 ??C (97.8 ??F)   SpO2: 96%     General: female in NAD.  Lungs: Respirations nonlabored    Renato Battles, MD [facility]  [facility] VIR

## 2022-09-26 LAB — URINALYSIS WITH MICROSCOPY
BACTERIA: NONE SEEN /HPF
BILIRUBIN UA: NEGATIVE
BLOOD UA: NEGATIVE
GLUCOSE UA: NEGATIVE
KETONES UA: NEGATIVE
LEUKOCYTE ESTERASE UA: NEGATIVE
NITRITE UA: NEGATIVE
PH UA: 7.5 (ref 5.0–9.0)
PROTEIN UA: NEGATIVE
RBC UA: <1 /HPF (ref <=4)
SPECIFIC GRAVITY UA: 1.032 — ABNORMAL HIGH (ref 1.003–1.030)
SQUAMOUS EPITHELIAL: 1 /HPF (ref 0–5)
UROBILINOGEN UA: <2
WBC UA: <1 /HPF (ref 0–5)

## 2022-09-26 LAB — CBC
HEMATOCRIT: 26.3 % — ABNORMAL LOW (ref 34.0–44.0)
HEMATOCRIT: 25.8 % — ABNORMAL LOW (ref 34.0–44.0)
HEMOGLOBIN: 8.7 g/dL — ABNORMAL LOW (ref 11.3–14.9)
HEMOGLOBIN: 8.5 g/dL — ABNORMAL LOW (ref 11.3–14.9)
MEAN CORPUSCULAR HEMOGLOBIN CONC: 33.2 g/dL (ref 32.0–36.0)
MEAN CORPUSCULAR HEMOGLOBIN CONC: 32.9 g/dL (ref 32.0–36.0)
MEAN CORPUSCULAR HEMOGLOBIN: 26.9 pg (ref 25.9–32.4)
MEAN CORPUSCULAR HEMOGLOBIN: 26.5 pg (ref 25.9–32.4)
MEAN CORPUSCULAR VOLUME: 80.9 fL (ref 77.6–95.7)
MEAN CORPUSCULAR VOLUME: 80.6 fL (ref 77.6–95.7)
MEAN PLATELET VOLUME: 9.3 fL (ref 6.8–10.7)
MEAN PLATELET VOLUME: 9.2 fL (ref 6.8–10.7)
PLATELET COUNT: 189 10*9/L (ref 150–450)
PLATELET COUNT: 196 10*9/L (ref 150–450)
RED BLOOD CELL COUNT: 3.25 10*12/L — ABNORMAL LOW (ref 3.95–5.13)
RED BLOOD CELL COUNT: 3.2 10*12/L — ABNORMAL LOW (ref 3.95–5.13)
RED CELL DISTRIBUTION WIDTH: 12.5 % (ref 12.2–15.2)
RED CELL DISTRIBUTION WIDTH: 12.3 % (ref 12.2–15.2)
WBC ADJUSTED: 4.5 10*9/L (ref 3.6–11.2)
WBC ADJUSTED: 6.3 10*9/L (ref 3.6–11.2)

## 2022-09-26 LAB — COMPREHENSIVE METABOLIC PANEL
ALBUMIN: 2.4 g/dL — ABNORMAL LOW (ref 3.4–5.0)
ALKALINE PHOSPHATASE: 56 U/L (ref 46–116)
ALT (SGPT): 16 U/L (ref 10–49)
ANION GAP: 8 mmol/L (ref 5–14)
AST (SGOT): 14 U/L (ref <=34)
BILIRUBIN TOTAL: <0.2 mg/dL — ABNORMAL LOW (ref 0.3–1.2)
BLOOD UREA NITROGEN: <5 mg/dL — ABNORMAL LOW (ref 9–23)
CALCIUM: 7.8 mg/dL — ABNORMAL LOW (ref 8.7–10.4)
CHLORIDE: 110 mmol/L — ABNORMAL HIGH (ref 98–107)
CO2: 21 mmol/L (ref 20.0–31.0)
CREATININE: 0.42 mg/dL — ABNORMAL LOW
EGFR CKD-EPI (2021) FEMALE: >90 mL/min/{1.73_m2} (ref >=60)
GLUCOSE RANDOM: 86 mg/dL (ref 70–179)
POTASSIUM: 3.6 mmol/L (ref 3.4–4.8)
PROTEIN TOTAL: 4.5 g/dL — ABNORMAL LOW (ref 5.7–8.2)
SODIUM: 139 mmol/L (ref 135–145)

## 2022-09-26 LAB — BLOOD GAS, VENOUS
BASE EXCESS VENOUS: -6 — ABNORMAL LOW (ref -2.0–2.0)
HCO3 VENOUS: 19 mmol/L — ABNORMAL LOW (ref 22–27)
O2 SATURATION VENOUS: 68 % (ref 40.0–85.0)
PCO2 VENOUS: 36 mmHg — ABNORMAL LOW (ref 40–60)
PH VENOUS: 7.34 (ref 7.32–7.43)
PO2 VENOUS: 38 mmHg (ref 30–55)

## 2022-09-26 LAB — PREGNANCY, URINE: PREGNANCY TEST URINE: NEGATIVE

## 2022-09-26 LAB — CK: CREATINE KINASE TOTAL: 41 U/L

## 2022-09-26 LAB — LACTATE, VENOUS, WHOLE BLOOD
LACTATE BLOOD VENOUS: 8.3 mmol/L (ref 0.5–1.8)
LACTATE BLOOD VENOUS: 6.4 mmol/L (ref 0.5–1.8)
LACTATE BLOOD VENOUS: 2.3 mmol/L — ABNORMAL HIGH (ref 0.5–1.8)

## 2022-09-26 LAB — HIGH SENSITIVITY TROPONIN I - SINGLE: HIGH SENSITIVITY TROPONIN I: <3 ng/L (ref <=34)

## 2022-09-26 MED ADMIN — oxyCODONE (ROXICODONE) immediate release tablet 15 mg: 15 mg | ORAL | @ 05:00:00 | Stop: 2022-10-09 | NDC 72865012805

## 2022-09-26 MED ADMIN — cefepime (MAXIPIME) 2 g in sodium chloride 0.9 % (NS) 100 mL IVPB-MBP: 2 g | INTRAVENOUS | @ 17:00:00 | Stop: 2022-10-03 | NDC 71288000921

## 2022-09-26 MED ADMIN — iohexol (OMNIPAQUE) 350 mg iodine/mL solution 100 mL: 100 mL | INTRAVENOUS | @ 18:00:00 | Stop: 2022-09-26 | NDC 00407141498

## 2022-09-26 MED ADMIN — ondansetron (ZOFRAN) injection 4 mg: 4 mg | INTRAVENOUS | @ 05:00:00 | NDC 71930001752

## 2022-09-26 MED ADMIN — buprenorphine-naloxone (SUBOXONE) 2-0.5 mg SL film 0.5 mg of buprenorphine: .25 | SUBLINGUAL | @ 17:00:00 | NDC 52427069211

## 2022-09-26 MED ADMIN — baclofen (LIORESAL) tablet 10 mg: 10 mg | ORAL | @ 21:00:00 | NDC 73320000204

## 2022-09-26 MED ADMIN — linezolid in dextrose 5% (ZYVOX) 600 mg/300 mL IVPB 600 mg: 600 mg | INTRAVENOUS | @ 17:00:00 | Stop: 2022-10-03 | NDC 72606000111

## 2022-09-26 MED ADMIN — oxyCODONE (ROXICODONE) immediate release tablet 15 mg: 15 mg | ORAL | @ 21:00:00 | Stop: 2022-10-09 | NDC 72865012805

## 2022-09-26 MED ADMIN — HYDROmorphone (PF) injection Syrg 0.5 mg: .5 mg | INTRAVENOUS | @ 18:00:00 | Stop: 2022-09-26 | NDC 00409426411

## 2022-09-26 MED ADMIN — acetaminophen (TYLENOL) tablet 1,000 mg: 1000 mg | ORAL | @ 07:00:00 | NDC 50580048790

## 2022-09-26 MED ADMIN — HYDROmorphone (PF) injection Syrg 0.5 mg: .5 mg | INTRAVENOUS | @ 14:00:00 | Stop: 2022-09-26 | NDC 00409426411

## 2022-09-26 MED ADMIN — ondansetron (ZOFRAN) injection 4 mg: 4 mg | INTRAVENOUS | @ 22:00:00 | NDC 71930001752

## 2022-09-26 MED ADMIN — oxyCODONE (ROXICODONE) immediate release tablet 15 mg: 15 mg | ORAL | @ 16:00:00 | Stop: 2022-10-09 | NDC 72865012805

## 2022-09-26 MED ADMIN — LORazepam (ATIVAN) tablet 1 mg: 1 mg | ORAL | @ 21:00:00 | Stop: 2022-09-26 | NDC 72789035695

## 2022-09-26 MED ADMIN — albuterol nebulizer solution 2.5 mg: 2.5 mg | RESPIRATORY_TRACT | @ 15:00:00 | Stop: 2022-09-26 | NDC 76204020060

## 2022-09-26 MED ADMIN — HYDROmorphone (PF) injection Syrg 0.5 mg: .5 mg | INTRAVENOUS | @ 07:00:00 | Stop: 2022-09-26 | NDC 00409426411

## 2022-09-26 MED ADMIN — HYDROmorphone (PF) injection Syrg 0.5 mg: .5 mg | INTRAVENOUS | @ 22:00:00 | Stop: 2022-09-26 | NDC 00409426411

## 2022-09-26 MED ADMIN — famotidine (PEPCID) tablet 20 mg: 20 mg | ORAL | @ 01:00:00 | NDC 96619043921

## 2022-09-26 MED ADMIN — oxyCODONE (ROXICODONE) immediate release tablet 15 mg: 15 mg | ORAL | @ 01:00:00 | Stop: 2022-10-09 | NDC 72865012805

## 2022-09-26 MED ADMIN — HYDROmorphone (PF) (DILAUDID) injection 0.5 mg: .5 mg | INTRAVENOUS | @ 02:00:00 | Stop: 2022-09-25 | NDC 00409426411

## 2022-09-26 MED ADMIN — buprenorphine-naloxone (SUBOXONE) 2-0.5 mg SL film 0.5 mg of buprenorphine: .25 | SUBLINGUAL | @ 14:00:00 | NDC 52427069211

## 2022-09-26 MED ADMIN — mirtazapine (REMERON) tablet 15 mg: 15 mg | ORAL | @ 02:00:00 | NDC 78206016001

## 2022-09-26 MED ADMIN — HYDROmorphone (PF) injection Syrg 0.5 mg: .5 mg | INTRAVENOUS | @ 20:00:00 | Stop: 2022-09-26 | NDC 00409426411

## 2022-09-26 MED ADMIN — LORazepam (ATIVAN) tablet 0.5 mg: .5 mg | ORAL | @ 16:00:00 | Stop: 2022-09-26 | NDC 76420064790

## 2022-09-26 MED ADMIN — buprenorphine-naloxone (SUBOXONE) 2-0.5 mg SL film 0.5 mg of buprenorphine: .25 | SUBLINGUAL | @ 21:00:00 | NDC 52427069211

## 2022-09-26 MED ADMIN — oxyCODONE (ROXICODONE) immediate release tablet 15 mg: 15 mg | ORAL | @ 10:00:00 | Stop: 2022-10-09 | NDC 72865012805

## 2022-09-26 MED ADMIN — lactated ringers bolus 1,000 mL: 1000 mL | INTRAVENOUS | @ 15:00:00 | Stop: 2022-09-26 | NDC 53191040901

## 2022-09-26 MED ADMIN — buprenorphine-naloxone (SUBOXONE) 2-0.5 mg SL film 0.5 mg of buprenorphine: .25 | SUBLINGUAL | @ 02:00:00 | NDC 52427069211

## 2022-09-26 MED ADMIN — promethazine (PHENERGAN) 12.5 mg in sodium chloride (NS) 0.9 % 50 mL IVPB: 12.5 mg | INTRAVENOUS | @ 18:00:00 | Stop: 2022-09-26 | NDC 60977000243

## 2022-09-26 MED ADMIN — lactated ringers bolus 1,000 mL: 1000 mL | INTRAVENOUS | @ 17:00:00 | Stop: 2022-09-26 | NDC 53191040901

## 2022-09-26 MED ADMIN — ondansetron (ZOFRAN) injection 4 mg: 4 mg | INTRAVENOUS | @ 14:00:00 | NDC 71930001752

## 2022-09-26 NOTE — Unmapped (Addendum)
Hospital Medicine Daily Progress Note    Assessment/Plan:    Principal Problem:    Other acute postprocedural pain  Active Problems:    [name]    Intestinal polyps    [name]    Neoplasm related pain    Physical deconditioning    History of colectomy    Intractable abdominal pain    Generalized anxiety disorder with panic attacks    SBO (small bowel obstruction) (CMS-HCC)    Small intestinal bacterial overgrowth (SIBO)  Resolved Problems:    * No resolved hospital problems. *                 [name] [name] is a 22 y.o. female that presented to [facility] with Other acute postprocedural pain.  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    Diffuse muscle stiffness, no increased tone  Recent anesthesia  History of desmoid tumors  -Patient was undergoing IR guided procedure for desmoid tumors which included steroid injection under ultrasound guidance  -Afterwards developed severe pain and shaking/rigidity  -She was admitted due to uncontrolled pain and rigidity  Plan:  -Continue pain management with oxycodone 15 mg every 4 hours and as needed Dilaudid 0.5 mg every 3 hours  -Recommend weaning Dilaudid IV as able  -Patient required rapid response earlier today due to elevated lactate of 8.3, significant rigidity, retching, tachycardia, diaphoresis and she was subsequently transferred to progressive care unit with critical care evaluation  -Patient was deemed appropriate for progressive unit by critical care team, continue to trend her lactate, now downtrending, she is afebrile without leukocytosis however given chills, rigors reported earlier as well as elevated lactate, she was placed on empiric broad-spectrum antibiotics with blood cultures pending  -Follow-up on blood cultures follow-up on urinalysis and urine culture, trend serial lactate, follow-up on CBC with differential in morning  -CT of abdomen pelvis as well as chest was obtained with IV contrast to rule out pulmonary embolism, bowel perforation given complicated GI history and elevated lactate, studies returned unrevealing aside from dilated bowel loops.  Case was discussed with general surgery who did not recommend further intervention.  -Case was discussed with neurology due to rigidity after anesthesia, concerned that lactate may be secondary to episodes of rigidity however CPK normal.  They recommend MRI of the spine with and without contrast to rule out desmoid tumor compression of spine    [name]  -Currently holding home nirogacest after discussion with hematology and oncology/solid tumor teamat     Small intestinal bacterial overgrowth  -History of colectomy  -Holding home cefdinir and doxycycline for SIBO in setting of broad-spectrum antibiotics    Prophylaxis  -Ambulation and heparin     Diet  -Nutrition Therapy Regular/House     Code Status / HCDM   -Full Code, Unverified but presumed, based on previous history   -  HCDM [facility] policy, no known patient preference): Herrero,[name] - Mother - [phone]    HCDM, back-up (If primary HCDM is unavailable): Lauman,Jeff - Father - [phone]      I personally spent 95 minutes face-to-face and non-face-to-face in the care of this patient, which includes all pre, intra, and post visit time on the date of service.  All documented time was specific to the E/M visit and does not include any procedures that may have been performed.    ___________________________________________________________________    Subjective:  Patient feeling nauseous, having episodes of retching,  rigidity, tenderness in her abdomen, but no fevers.      Labs/Studies:  Labs and Studies from the last 24hrs per EMR and Reviewed    Objective:  Temp:  [36.3 ??C (97.4 ??F)-36.9 ??C (98.4 ??F)] 36.7 ??C (98 ??F)  Heart Rate:  [72-111] 81  SpO2 Pulse:  [79-104] 79  Resp:  [15-24] 24  BP: (104-147)/(55-110) 114/55  SpO2:  [95 %-99 %] 97 %    Cardiovascular: Regular rhythm, tachycardic, no murmurs  Lungs: No wheezes, no rales, no rhonchi  Neurological:, No nystagmus, no gaze deviation, however positive for diffuse musculoskeletal weakness in all extremities as well as rigidity however rigidity is mild/mainly increased tone rather than inability to move limbs at all  Abdomen: Tender to palpation in all quadrants, no rebounding or guarding

## 2022-09-26 NOTE — Unmapped (Signed)
Initial Consult Note      Requesting Attending Physician:  Rocco Serene, MD  Service Requesting Consult: [facility])     Assessment and Plan       [name] [name] is a 22 y.o. female on whom I have been asked by Rocco Serene, MD to consult for  muscle stiffness.    #Increased Tone  Worsening BLE stiffness since procedure 7/18. 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 Does have chronic component of muscle stiffness and spasms for which she is on baclofen. Less likely acute dystonic reaction, has received anti-dopaminergic medications this admission though is not medication naive and would not expect continued symptoms 24 hours after administration. Clinical picture not consistent with neuroleptic malignant disorder or serotonin syndrome. Given neoplastic process could consider a paraneoplastic etiology of increased tone though feel this is less likely. Less familiar with adverse reactions to anesthetic medications as clinically questioned by primary team though appears unlikely, defer to anesthesia if remains a concern. Recommend spinal imaging and symptomatic management as below.    Recommendations:  -MRI T-spine and L-spine with and without contrast  -Consider up-titration of balcofen vs switch to tizanidine for symptomatic treatment (allergy to inactive ingredient of tizanidine so would check with pharmacy prior to initiation)  -Serum autoimmune/paraneoplastic panel    This patient was seen and discussed with Dr. Fabian November, who agrees with the above assessment and plan.      Allayne Stack, MD  PGY-3 Neurology      ATTESTATION NOTE:  I saw and evaluated the patient, participating in the key portions of the service.  I reviewed the resident???s note and agree with the resident???s findings and plan as documented in their note.    [name], DO  Assistant Professor  Department of Neurology  [place] [facility]       HPI      Reason for Consult: muscle stiffness    [name] [name] is a 22 y.o. female on whom I have been asked by Rocco Serene, MD to consult for muscle stiffness.    Admitted on 7/18 for pain following steroid/anesthetic injection of desmoid tumor in the right forearm and left upper chest under general anesthesia. Has chronic pain and follows with pain management. 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     RRT on 7/19 for chest pain, diaphoresis, tachycardia, retching and abdominal tenderness with continued complaint of pain. Reportedly patient was conversant and following commands. Vitals stable and patient was afebrile. Primary team noted flexion of bilateral arms and legs with question of rigidity.    Notable medications include suboxone, ativan 0.5mg  TID PRN, dilaudid PRN, oxycodone PRN, and baclofen 5mg  TID PRN. Has phenergan on MAR received one dose on 7/18 as well as Zofran.     Patient notes worsening pain, nausea, appetite loss, and headaches since about Saturday leading up to her procedure. Has been largely in bed with minimal ambulation due to these symptoms and has been more stiff over the past week. Acute worsening of lower extremity and abdominal muscle tightness and pain following the procedure on 7/18, now feels like she cannot move her ankles well and that she is shaky when she stands. The muscle tightness is provoking increased pain.     Also notes persistent whole-head headache  associated with visual scotomas.     Recently started on nirogacestat (a notch receptor pathway inhibitor) for her tumors.    Allergies   Allergen Reactions    Adhesive Tape-Silicones Hives and Rash     Paper tape  And tegederm ok    Ferrlecit [Sodium Ferric Gluconat-Sucrose] Swelling and Rash    Levofloxacin Swelling and Rash     Swelling in mouth, rash, Methylnaltrexone      Per Patient: I lost bowel control, severe abdominal cramping, and elevated BP    Neomycin Swelling     Rxn after ear drops; ear swelling    Papaya Hives    Morphine Nausea And Vomiting    Zosyn [Piperacillin-Tazobactam] Itching and Rash     Red and itchy    Compazine [Prochlorperazine] Other (See Comments)     Extreme agitation    Iron Analogues     Iron Dextran Itching     Received iron dextran 06/08/22 over 12 hours, had itching and redness/flushing during the infusion and for a couple days after. Required IV benadryl w/flares between doses and ultimately treated w/IV methylpred for 2 days.     Latex, Natural Rubber Rash       Current Facility-Administered Medications:     acetaminophen (TYLENOL) tablet 1,000 mg, 1,000 mg, Oral, Q8H, Mabry, Dana Alexandra, AGNP, 1,000 mg at 09/26/22 0315    acetaminophen (TYLENOL) tablet 650 mg, 650 mg, Oral, Q4H PRN, Terri Piedra, AGNP    albuterol 2.5 mg /3 mL (0.083 %) nebulizer solution, , , ,     baclofen (LIORESAL) tablet 5 mg, 5 mg, Oral, TID PRN, Terri Piedra, AGNP, 5 mg at 09/25/22 1930    buprenorphine-naloxone (SUBOXONE) 2-0.5 mg SL film 0.5 mg of buprenorphine, 0.25 Film, Sublingual, TID, Mabry, Rosalyn Charters, AGNP, 0.5 mg of buprenorphine at 09/26/22 0947    calcium carbonate (TUMS) chewable tablet 400 mg elem calcium, 400 mg elem calcium, Oral, Daily PRN, Mabry, Rosalyn Charters, AGNP    cefdinir (OMNICEF) capsule 300 mg, 300 mg, Oral, Q12H SCH, Mabry, Rosalyn Charters, AGNP    cefepime (MAXIPIME) 2 g in sodium chloride 0.9 % (NS) 100 mL IVPB-MBP, 2 g, Intravenous, Q8H SCH, Rocco Serene, MD    Melene Muller ON 09/27/2022] doxycycline (VIBRA-TABS) tablet 100 mg, 100 mg, Oral, BID, Mabry, Dana Alexandra, AGNP    famotidine (PEPCID) tablet 20 mg, 20 mg, Oral, Nightly, Terri Piedra, AGNP, 20 mg at 09/25/22 2036    HYDROmorphone (PF) injection Syrg 0.5 mg, 0.5 mg, Intravenous, Q3H PRN, Terri Piedra, AGNP, 0.5 mg at 09/26/22 1018    lactated ringers bolus 1,000 mL, 1,000 mL, Intravenous, Once, Mabry, [employer], AGNP, 1,000 mL at 09/26/22 1052    lactated ringers bolus 1,000 mL, 1,000 mL, Intravenous, Once, Rocco Serene, MD    lidocaine 4 % patch 2 patch, 2 patch, Transdermal, Daily, Terri Piedra, AGNP, 2 patch at 09/25/22 1925    linezolid in dextrose 5% (ZYVOX) 600 mg/300 mL IVPB 600 mg, 600 mg, Intravenous, Q12H SCH, Rocco Serene, MD    LORazepam (ATIVAN) tablet 0.5 mg, 0.5 mg, Oral, TID PRN, Terri Piedra, AGNP, 0.5 mg at 09/26/22 1208    melatonin tablet 3 mg, 3 mg, Oral, Nightly PRN, Mabry, Rosalyn Charters, AGNP    mirtazapine (REMERON) tablet 15 mg, 15 mg, Oral, Nightly, Mabry, Dana Alexandra, AGNP, 15 mg at 09/25/22 2225    ondansetron (ZOFRAN) injection 4 mg, 4 mg, Intravenous, Q6H PRN, Mabry,  Rosalyn Charters, AGNP, 4 mg at 09/26/22 1014    oxyCODONE (ROXICODONE) immediate release tablet 15 mg, 15 mg, Oral, Q4H PRN, Terri Piedra, AGNP, 15 mg at 09/26/22 1207    pantoprazole (Protonix) EC tablet 40 mg, 40 mg, Oral, Daily, Mabry, Dana Alexandra, AGNP    polyethylene glycol (MIRALAX) packet 17 g, 17 g, Oral, Daily PRN, Terri Piedra, AGNP    promethazine (PHENERGAN) 12.5 mg in sodium chloride (NS) 0.9 % 50 mL IVPB, 12.5 mg, Intravenous, Q6H PRN, Terri Piedra, AGNP  Past Medical History:   Diagnosis Date    Abdominal pain     Acid reflux     occas    Anesthesia complication     itching, shaking, coldness; last few surgeries have gone much better    Cancer (CMS-HCC)     Cataract of right eye     COVID-19 virus infection 01/2019    Cyst of thyroid determined by ultrasound     monitoring    [name]     2 right forearm, 1 left thigh, 1 right scapula, 1 under left clavicle; multiple    Difficult intravenous access     [name] (familial adenomatous polyposis)     [name]     Gastric polyps     History of chemotherapy     last treatment approx 05/2019 History of colon polyps     History of COVID-19 01/2019    Iron deficiency anemia due to chronic blood loss     received iron infusion 11-2019    PONV (postoperative nausea and vomiting)     Rectal bleeding     Syncopal episodes     especially if becoming dehydrated     Past Surgical History:   Procedure Laterality Date    COLON SURGERY      cyroablation      cystis removal      desmoid removal      PR CLOSE ENTEROSTOMY,RESEC+ANAST N/A 10/09/2020    Procedure: ILEOSTOMY TAKEDOWN;  Surgeon: Mickle Asper, MD;  Location: OR [place];  Service: General Surgery    PR COLONOSCOPY W/BIOPSY SINGLE/MULTIPLE N/A 10/27/2012    Procedure: COLONOSCOPY, FLEXIBLE, PROXIMAL TO SPLENIC FLEXURE; WITH BIOPSY, SINGLE OR MULTIPLE;  Surgeon: Shirlyn Goltz Mir, MD;  Location: PEDS PROCEDURE ROOM [facility];  Service: Gastroenterology    PR COLONOSCOPY W/BIOPSY SINGLE/MULTIPLE N/A 09/14/2013    Procedure: COLONOSCOPY, FLEXIBLE, PROXIMAL TO SPLENIC FLEXURE; WITH BIOPSY, SINGLE OR MULTIPLE;  Surgeon: Shirlyn Goltz Mir, MD;  Location: PEDS PROCEDURE ROOM [facility];  Service: Gastroenterology    PR COLONOSCOPY W/BIOPSY SINGLE/MULTIPLE N/A 11/08/2014    Procedure: COLONOSCOPY, FLEXIBLE, PROXIMAL TO SPLENIC FLEXURE; WITH BIOPSY, SINGLE OR MULTIPLE;  Surgeon: Arnold Long Mir, MD;  Location: PEDS PROCEDURE ROOM [facility];  Service: Gastroenterology    PR COLONOSCOPY W/BIOPSY SINGLE/MULTIPLE N/A 12/26/2015    Procedure: COLONOSCOPY, FLEXIBLE, PROXIMAL TO SPLENIC FLEXURE; WITH BIOPSY, SINGLE OR MULTIPLE;  Surgeon: Arnold Long Mir, MD;  Location: PEDS PROCEDURE ROOM [facility];  Service: Gastroenterology    PR COLONOSCOPY W/BIOPSY SINGLE/MULTIPLE N/A 09/02/2017    Procedure: COLONOSCOPY, FLEXIBLE, PROXIMAL TO SPLENIC FLEXURE; WITH BIOPSY, SINGLE OR MULTIPLE;  Surgeon: Arnold Long Mir, MD;  Location: PEDS PROCEDURE ROOM ;  Service: Gastroenterology    PR COLSC FLX W/REMOVAL LESION BY HOT BX FORCEPS N/A 08/27/2016    Procedure: COLONOSCOPY, FLEXIBLE, PROXIMAL TO SPLENIC FLEXURE; W/REMOVAL TUMOR/POLYP/OTHER LESION, HOT BX FORCEP/CAUTE;  Surgeon: Arnold Long Mir, MD;  Location: PEDS PROCEDURE ROOM [facility];  Service: Gastroenterology  PR COLSC FLX W/RMVL OF TUMOR POLYP LESION SNARE TQ N/A 02/25/2019    Procedure: COLONOSCOPY FLEX; W/REMOV TUMOR/LES BY SNARE;  Surgeon: Helyn Numbers, MD;  Location: GI PROCEDURES [facility] [facility];  Service: Gastroenterology    PR COLSC FLX W/RMVL OF TUMOR POLYP LESION SNARE TQ N/A 03/13/2020    Procedure: COLONOSCOPY FLEX; W/REMOV TUMOR/LES BY SNARE;  Surgeon: Helyn Numbers, MD;  Location: GI PROCEDURES [place] [facility];  Service: Gastroenterology    PR EXC SKIN BENIG 2.1-3 CM TRUNK,ARM,LEG Right 02/25/2017    Procedure: EXCISION, BENIGN LESION INCLUDE MARGINS, EXCEPT SKIN TAG, LEGS; EXCISED DIAMETER 2.1 TO 3.0 CM;  Surgeon: Clarene Duke, MD;  Location: CHILDRENS OR [facility];  Service: Plastics    PR EXC SKIN BENIG 3.1-4 CM TRUNK,ARM,LEG Right 02/25/2017    Procedure: EXCISION, BENIGN LESION INCLUDE MARGINS, EXCEPT SKIN TAG, ARMS; EXCISED DIAMETER 3.1 TO 4.0 CM;  Surgeon: Clarene Duke, MD;  Location: CHILDRENS OR [facility];  Service: Plastics    PR EXC SKIN BENIG >4 CM FACE,FACIAL Right 02/25/2017    Procedure: EXCISION, OTHER BENIGN LES INCLUD MARGIN, FACE/EARS/EYELIDS/NOSE/LIPS/MUCOUS MEMBRANE; EXCISED DIAM >4.0 CM;  Surgeon: Clarene Duke, MD;  Location: CHILDRENS OR [facility];  Service: Plastics    PR EXC TUMOR SOFT TISSUE LEG/ANKLE SUBQ 3+CM Right 08/05/2019    Procedure: EXCISION, TUMOR, SOFT TISSUE OF LEG OR ANKLE AREA, SUBCUTANEOUS; 3 CM OR GREATER;  Surgeon: Arsenio Katz, MD;  Location: MAIN OR [place];  Service: Plastics    PR EXC TUMOR SOFT TISSUE LEG/ANKLE SUBQ <3CM Right 08/05/2019    Procedure: EXCISION, TUMOR, SOFT TISSUE OF LEG OR ANKLE AREA, SUBCUTANEOUS; LESS THAN 3 CM;  Surgeon: Arsenio Katz, MD;  Location: MAIN OR [facility];  Service: Plastics    PR LAP, SURG PROCTECTOMY W J-POUCH N/A 08/10/2020 Procedure: ROBOTIC ASSISTED LAPAROSCOPIC PROCTOCOLECTOMY, ILEAL J POUCH, WITH OSTOMY;  Surgeon: Mickle Asper, MD;  Location: OR [place];  Service: General Surgery    PR NDSC EVAL INTSTINAL POUCH DX W/COLLJ SPEC SPX N/A 01/23/2021    Procedure: ENDO EVAL SM INTEST POUCH; DX;  Surgeon: Modena Nunnery, MD;  Location: [facility] PROCEDURES [place] [facility];  Service: Gastroenterology    PR NDSC EVAL INTSTINAL POUCH DX W/COLLJ SPEC SPX N/A 08/27/2021    Procedure: ENDO EVAL SM INTEST POUCH; DX;  Surgeon: Hunt Oris, MD;  Location: [facility] PROC[facility]URES [facility];  Service: Gastroenterology    PR NDSC EVAL INTSTINAL POUCH DX W/COLLJ SPEC SPX N/A 12/09/2021    Procedure: ENDO EVAL SM INTEST POUCH; DX;  Surgeon: Vidal Schwalbe, MD;  Location: GI PROCEDURES [facility];  Service: Gastroenterology    PR NDSC EVAL INTSTINAL POUCH DX W/COLLJ [facility] SPX Left 04/09/2022    Procedure: ENDO EVAL SM INTEST POUCH; DX;  Surgeon: Modena Nunnery, MD;  Location: [facility] PROCEDURES [place] [facility];  Service: Gastroenterology    PR NDSC EVAL INTSTINAL POUCH DX W/COLLJ SPEC SPX N/A 08/05/2022    Procedure: ENDO EVAL SM INTEST POUCH; DX;  Surgeon: Modena Nunnery, MD;  Location: GI PROCEDURES [facility];  Service: Gastroenterology    PR NDSC EVAL INTSTINAL POUCH W/BX SINGLE/MULTIPLE N/A 01/20/2022    Procedure: ENDOSCOPIC EVAL OF SMALL INTESTINAL POUCH; DIAGNOSTIC, No biopsies;  Surgeon: Andrey Farmer, MD;  Location: GI PROCEDURES [facility];  Service: Gastroenterology    PR NDSC EVAL INTSTINAL POUCH W/BX SINGLE/MULTIPLE N/A 02/13/2022    Procedure: ENDOSCOPIC EVAL OF SMALL INTESTINAL POUCH; DIAGNOSTIC, WITH BIOPSY;  Surgeon: Bronson Curb, MD;  Location: GI PROCEDURES [facility];  Service: Gastroenterology    PR UNLISTED PROCEDURE SMALL INTESTINE  01/23/2021    Procedure: UNLISTED PROCEDURE, SMALL INTESTINE;  Surgeon: Modena Nunnery, MD;  Location: [facility] [place] [facility];  Service: Gastroenterology    PR UNLISTED PROCEDURE SMALL INTESTINE  02/13/2022    Procedure: UNLISTED PROCEDURE, SMALL INTESTINE;  Surgeon: Bronson Curb, MD;  Location: GI PROCEDURES [facility];  Service: Gastroenterology    PR UPPER GI ENDOSCOPY,BIOPSY N/A 10/27/2012    Procedure: UGI ENDOSCOPY; WITH BIOPSY, SINGLE OR MULTIPLE;  Surgeon: Shirlyn Goltz Mir, MD;  Location: PEDS PROCEDURE ROOM [facility];  Service: Gastroenterology    PR UPPER GI ENDOSCOPY,BIOPSY N/A 09/14/2013    Procedure: UGI ENDOSCOPY; WITH BIOPSY, SINGLE OR MULTIPLE;  Surgeon: Shirlyn Goltz Mir, MD;  Location: PEDS PROCEDURE ROOM [facility];  Service: Gastroenterology    PR UPPER GI ENDOSCOPY,BIOPSY N/A 11/08/2014    Procedure: UGI ENDOSCOPY; WITH BIOPSY, SINGLE OR MULTIPLE;  Surgeon: Arnold Long Mir, MD;  Location: PEDS PROCEDURE ROOM [facility];  Service: Gastroenterology    PR UPPER GI ENDOSCOPY,BIOPSY N/A 12/26/2015    Procedure: UGI ENDOSCOPY; WITH BIOPSY, SINGLE OR MULTIPLE;  Surgeon: Arnold Long Mir, MD;  Location: PEDS PROCEDURE ROOM [facility];  Service: Gastroenterology    PR UPPER GI ENDOSCOPY,BIOPSY N/A 08/27/2016    Procedure: UGI ENDOSCOPY; WITH BIOPSY, SINGLE OR MULTIPLE;  Surgeon: Arnold Long Mir, MD;  Location: PEDS PROCEDURE ROOM [facility];  Service: Gastroenterology    PR UPPER GI ENDOSCOPY,BIOPSY N/A 09/02/2017    Procedure: UGI ENDOSCOPY; WITH BIOPSY, SINGLE OR MULTIPLE;  Surgeon: Arnold Long Mir, MD;  Location: PEDS PROCEDURE ROOM [facility];  Service: Gastroenterology    PR UPPER GI ENDOSCOPY,BIOPSY N/A 03/13/2020    Procedure: UGI ENDOSCOPY; WITH BIOPSY, SINGLE OR MULTIPLE;  Surgeon: Helyn Numbers, MD;  Location: GI PROCEDURES [place] [facility];  Service: Gastroenterology    PR UPPER GI ENDOSCOPY,BIOPSY N/A 09/05/2021    Procedure: UGI ENDOSCOPY; WITH BIOPSY, SINGLE OR MULTIPLE;  Surgeon: Wendall Papa, MD;  Location: GI PROCEDURES [facility];  Service: Gastroenterology    PR UPPER GI ENDOSCOPY,DIAGNOSIS N/A 01/20/2022    Procedure: UGI ENDO, INCLUDE ESOPHAGUS, STOMACH, & DUODENUM &/OR JEJUNUM; DX W/WO COLLECTION SPECIMN, BY BRUSH OR WASH;  Surgeon: Andrey Farmer, MD;  Location: GI PROCEDURES [facility];  Service: Gastroenterology    TUMOR REMOVAL      multiple-head, neck, back, hand, right flank, multiple     Social History     Socioeconomic History    Marital status: Single     Spouse name: None    Number of children: None    Years of education: None    Highest education level: None   Tobacco Use    Smoking status: Never     Passive exposure: Past    Smokeless tobacco: Never   Vaping Use    Vaping status: Never Used   Substance and Sexual Activity    Alcohol use: Never    Drug use: Never    Sexual activity: Never   Other Topics Concern    Do you use sunscreen? Yes    Tanning bed use? No    Are you easily burned? No    Excessive sun exposure? No    Blistering sunburns? 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     Social Determinants of Psychologist, prison and probation services  Strain: Low Risk  (09/26/2022)    Overall Financial Resource Strain (CARDIA)     Difficulty of Paying Living Expenses: Not very hard   Food Insecurity: No Food Insecurity (09/26/2022)    Hunger Vital Sign     Worried About Running Out of Food in the Last Year: Never true     Ran Out of Food in the Last Year: Never true   Transportation Needs: No Transportation Needs (09/26/2022)    PRAPARE - Therapist, art (Medical): No     Lack of Transportation (Non-Medical): No     Family History   Problem Relation Age of Onset    No Known Problems Mother     No Known Problems Father     No Known Problems Sister     No Known Problems Brother     Stroke Maternal Grandmother     Other Maternal Grandmother         benign lesions of liver and pancreas, further details unknown    Cancer Maternal Grandmother     Diabetes Maternal Grandmother     Hypertension Maternal Grandmother     Thyroid disease Maternal Grandmother     Arthritis Maternal Grandfather     Asthma Maternal Grandfather     COPD Paternal Grandmother         Deceased    Miscarriages / Stillbirths Paternal Grandmother     Alcohol abuse Paternal Grandfather         Deceased    No Known Problems Maternal Aunt     No Known Problems Maternal Uncle     No Known Problems Paternal Aunt     No Known Problems Paternal Uncle     Anesthesia problems Neg Hx     Broken bones Neg Hx     Cancer Neg Hx     Clotting disorder Neg Hx     Collagen disease Neg Hx     Diabetes Neg Hx     Dislocations Neg Hx     Fibromyalgia Neg Hx     Gout Neg Hx     Hemophilia Neg Hx     Osteoporosis Neg Hx     Rheumatologic disease Neg Hx     Scoliosis Neg Hx     Severe sprains Neg Hx     Sickle cell anemia Neg Hx     Spinal Compression Fracture Neg Hx     Melanoma Neg Hx     Basal cell carcinoma Neg Hx     Squamous cell carcinoma Neg Hx      Code Status: Full Code     Review of Systems   A 10-system review of systems was conducted and was negative except as documented above in the HPI.       Objective   Temp:  [36.3 ??C (97.4 ??F)-36.9 ??C (98.4 ??F)] 36.9 ??C (98.4 ??F)  Heart Rate:  [72-125] 90  Resp:  [14-46] 18  BP: (99-147)/(53-110) 147/78  SpO2:  [88 %-99 %] 98 %    General Appearance:In no acute distress.  HEENT: Head is atraumatic and normocephalic. Sclera anicteric without injection. Oropharyngeal membranes are moist with no erythema or exudate.  Cardiopulmonary: Regular rate and rhythm. No murmurs, rubs, or gallops. Normal work of breathing on room air.  Extremities:  No rash, edema.    Neurological Examination:   Mental Status: Alert, fully oriented, answers questions appropriately. Attention and concentration grossly intact to interview and physical. Spontaneous speech was  fluent without word finding pauses, dysarthria, or paraphasic errors. Comprehension was intact to simple and multi-step commands. Memory for recent and remote events was intact.    Cranial Nerves: Visual fields intact to direct confrontation.  PERRL. Pursuit eye movements were uninterrupted with full range and without more than end-gaze nystagmus. Face symmetric at rest. Normal facial movement bilaterally, including forehead, eye closure and grimace/smile. Hearing intact to conversation. Tongue protrudes midline and tongue movements are normal.    Motor Exam: Normal bulk. Normal tone in bilateral upper extremities. Increased tone in bilateral lower extremities without spasticity. Worse at ankles bilaterally than the knees and more normal at the hips.  RLE tone on the Modified Ashworth Scale: 2 (more marked increase in muscle tone through most of the ROM, but affected part easily moved).  LLE tone on the Modified Ashworth Scale: 3 (considerable increase in muscle tone passive, movement difficult).  Some give-way weakness throughout.  UE R/L: biceps 5/5, triceps 5/5, interossei 4+/4+, and hand grip mildly weak/strong   LE R/L: hip flexion 5-/5-, quadriceps 5-/5-, hamstrings 5-/5-, dorsiflexion 5/5, and plantar flexion 5/5.    Reflexes: DTRs R/L: biceps 2+/2+, brachioradialis 2+/2+, and patella 2+/3+ with 3-5 beats of clonus. Unable to obtain ankle reflexes given plantarflexion with limited ROM, no clear clonus within this limitation. No crossed adductors.    Sensory: Sensation normal to light touch and temperature sensation to cold in both hands and both feet    Cerebellar/Coordination/Gait: Finger-to-nose is normal without ataxia or dysmetria bilaterally. Deferred gait given pain.      Diagnostic Studies      Imaging and relevant diagnostic studies reviewed, pertinent findings as in above assessment and plan.

## 2022-09-26 NOTE — Unmapped (Signed)
[facility] Medicine   History and Physical       Assessment and Plan     Keeya Jaquise Fein is a 22 y.o. 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      Other acute postprocedural pain following injection of desmoid tumor with steroid and anesthetic under ultrasound guidance requiring general anesthesia complicated by chronic abdominal pain and chronic pain from her desmoid tumors.  Illness that poses a threat to bodily function without appropriate treatment  Patient has had similar procedures in the past but has had different types of general anesthesia.  She generally has significant pain and shaking following procedures, but is experiencing more pain and shaking on this procedure.  We discussed the role that anxiety and anticipatory pain could be playing a role.  Discussed the importance of box breathing and nonnarcotic modalities.  Ultimately, we decided upon a plan that continued home medication with her highest dose of oxycodone that she generally takes at home with a short bridge of IV hydromorphone.  Patient does see an outpatient pain specialist and recommend consult to anesthesia pain if symptoms persist in the morning.  -Hydromorphone 0.5 every 2 hours as needed x 5 hours  -Tylenol 1000 mg 3 times daily  -Continuing home Suboxone  -Treating depression and anxiety as below  -Home baclofen as needed  -Home lorazepam as needed    Secondary/Additional Active Problems:        [name]  -Continuing home nirogacestat (patient brought in home supply)      Generalized anxiety disorder with panic attacks  -Mirtazapine      Small intestinal bacterial overgrowth (SIBO) status post GI surgery and past colectomy  -Continuing home cefdinir Monday Wednesday Friday and doxycycline Tuesday Thursday Saturday Sunday.  -Famotidine  -Pantoprazole      Prophylaxis  -Ambulation    Diet  -Nutrition Therapy Regular/House    Code Status / HCDM   -Full Code, Unverified but presumed, based on previous history   -  HCDM [employer] policy, no known patient preference): Lykins,[name] - Mother - [phone]    HCDM, back-up (If primary HCDM is unavailable): Schwanz,Jeff - Father - [phone]    Anticipated Medically Ready for Discharge: Anticipated Tomorrow      Issues Impacting Complexity of Management:  -Parenteral controlled medications: IV Dilaudid    Medical Decision Making: Reviewed records from the following unique sources EMR. Discussed the patient's management and/or test interpretation with VIR as summarized within this note    I personally spent greater than 105 minutes face-to-face and non-face-to-face in the care of this patient, which includes all pre, intra, and post visit time on the date of service.  All documented time was specific to the E/M visit and does not include any procedures that may have been performed.    HPI      Tiffany Kuzmenko is a 22 y.o. female who is presenting to [facility] with Other acute postprocedural pain.    Patient comes [facility] for treatment of existing desmoid tumors.  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  She is specifically targeting tumors at her right wrist to improve mobility.  Patient was scheduled for triamcinolone and bupivacaine injection by VIR under general anesthesia.  Patient has had similar procedures done in the past and has generally required general anesthesia and a certain protocol for each  1.  Patient and mom are clear that they only received propofol with this procedure and that they feel that increased pain and shakes that occurred following the procedure secondary to having only propofol for the procedure.  In general, after these procedures, patient generally has increased pain for at least 1 week.  This particular procedure is further complicated by recent treatment of her [name] with a new chemotherapeutic agent (nirogacestat) and egg retrieval done 2 weeks prior.  Since starting the new medication and egg retrieval, patient notes increased diaphoresis and discomfort.  She has persisted to have diaphoresis and discomfort even 2 weeks later and the symptoms are also distressing to her at this time.    Going through her medication list, much attention was given to her pain regimen.  Patient remains very anxious that she is going to have more pain throughout the night and we discussed that she has not had any of her oral medications yet and our hope is that she will improve and be well tomorrow.    Patient with ongoing lightheadedness and history of syncope at home.  Last syncope was at her last hospital stay.    Patient has ongoing acute on chronic pain and reports that she takes her oxycodone 5 mg 3 times daily sometimes as much as 15 mg.    Patient also reports that given multiple hospital stays and recent egg retrieval, she has been much more fatigued than normal.    PRU stay significant for significant shaking and post procedural pain.    Med Rec Confidence   I reviewed the Medication List. The current list is Accurate    Physical Exam   Temp:  [36.3 ??C (97.4 ??F)-36.6 ??C (97.8 ??F)] 36.3 ??C (97.4 ??F)  Heart Rate:  [83-125] 96  Resp:  [14-46] 24  BP: (99-128)/(53-110) 128/110  SpO2:  [88 %-99 %] 96 %  There is no height or weight on file to calculate BMI.  GEN: NAD, lying in bed.  Chronically ill  EYES: EOMI  ENT: MMM  CV: RRR, warm hands strong pulses  PULM: CTA B  ABD: soft, NT/ND, +BS  EXT: No edema  NEURO: No focal deficits  PSYCH: A+Ox3, appropriate          ___________________________________________________________________    Medications     Prior to Admission medications    Medication Dose, Route, Frequency   buprenorphine-naloxone (SUBOXONE) 2-0.5 mg sublingual film 0.25 Film, Sublingual, 3 times a day, Fill on or after: 09/05/22   acetaminophen (TYLENOL) 500 MG tablet 1,000 mg, Oral, Every 8 hours   ammonium lactate (LAC-HYDRIN) 12 % lotion 1 Application, Topical, 2 times a day (standard)   baclofen (LIORESAL) 5 mg Tab tablet 5 mg, Oral, 3 times a day PRN   cefdinir (OMNICEF) 300 MG capsule Take 1 capsule (300mg ) twice a day on Mon, Wed, Friday   cholecalciferol, vitamin D3 25 mcg, 1,000 units,, 1,000 unit (25 mcg) tablet 25 mcg, Oral, Daily (standard)   clobetasoL (TEMOVATE) 0.05 % ointment Topical, 2 times a day (standard), To stubborn/thick rashes on hands/feet ears until clear/smooth. Restart as needed   diclofenac sodium (VOLTAREN) 1 % gel 2 g, Topical, 4 times a day PRN   doxycycline (VIBRA-TABS) 100 MG tablet Take 1 tablets by mouth twice a day on Tuesday, Thursday, Saturday, Sunday.   famotidine (PEPCID) 20 MG tablet 20 mg, Oral, Nightly   fluticasone propionate (FLONASE) 50 mcg/actuation nasal spray 1 spray on skin prior to application of butrans  patch to reduce irritation and itching   hydrocortisone (ANUSOL-HC) 2.5 % rectal cream Rectal, 2 times a day PRN   lidocaine 4 % patch Place 2 patches on the skin daily for 7 days. Apply to affected area for 12 hours, then remove for 12 hours.   LORazepam (ATIVAN) 0.5 MG tablet 0.5 mg, Oral, 3 times a day PRN   mirtazapine (REMERON) 15 MG tablet Take  1 tablet by mouth (15mg ) at night with 7.5mg  tablet to make 22.5mg  nightly   mirtazapine (REMERON) 7.5 MG tablet Take 1 tablet by mouth (7.5mg ) with 15mg  tab to make 22.5mg  daily   naloxone (NARCAN) 4 mg nasal spray One spray in either nostril once for known/suspected opioid overdose. May repeat every 2-3 minutes in alternating nostril til EMS arrives   nirogacestat (OGSIVEO) 50 mg tablet 150 mg, Oral, 2 times a day (standard), Swallow tablets whole; do not break, crush, or chew.   oxyCODONE (ROXICODONE) 10 mg immediate release tablet 10 mg, Oral, 3 times a day PRN, Fill on or after: 09/08/22   pimecrolimus (ELIDEL) 1 % cream Topical, 2 times a day PRN, To face as needed for rash   triamcinolone (KENALOG) 0.1 % cream Topical, 2 times a day PRN, Apply to rash as needed   zinc oxide 10 % Crea 1 application., Topical, Daily PRN       Allergies   Adhesive tape-silicones; Ferrlecit [sodium ferric gluconat-sucrose]; Levofloxacin; Methylnaltrexone; Neomycin; Papaya; Morphine; Zosyn [piperacillin-tazobactam]; Compazine [prochlorperazine]; Iron analogues; Iron dextran; and Latex, natural rubber     Medical History     Past Medical History:   Diagnosis Date    Abdominal pain     Acid reflux     occas    Anesthesia complication     itching, shaking, coldness; last few surgeries have gone much better    Cancer (CMS-HCC)     Cataract of right eye     COVID-19 virus infection 01/2019    Cyst of thyroid determined by ultrasound     monitoring    [name]     2 right forearm, 1 left thigh, 1 right scapula, 1 under left clavicle; multiple    Difficult intravenous access     [name] (familial adenomatous polyposis)     [name]     Gastric polyps     History of chemotherapy     last treatment approx 05/2019    History of colon polyps     History of COVID-19 01/2019    Iron deficiency anemia due to chronic blood loss     received iron infusion 11-2019    PONV (postoperative nausea and vomiting)     Rectal bleeding     Syncopal episodes     especially if becoming dehydrated       Social History   Tobacco use:   reports that she has never smoked. She has been exposed to tobacco smoke. She has never used smokeless tobacco.  Alcohol use:   reports no history of alcohol use.  Drug use:  reports no history of drug use.    Family History     Family History   Problem Relation Age of Onset    No Known Problems Mother     No Known Problems Father     No Known Problems Sister     No Known Problems Brother     Stroke Maternal Grandmother     Other Maternal Grandmother  benign lesions of liver and pancreas, further details unknown    Cancer Maternal Grandmother     Diabetes Maternal Grandmother     Hypertension Maternal Grandmother     Thyroid disease Maternal Grandmother     Arthritis Maternal Grandfather     Asthma Maternal Grandfather     COPD Paternal Grandmother         Deceased    Miscarriages / Stillbirths Paternal Grandmother     Alcohol abuse Paternal Grandfather         Deceased    No Known Problems Maternal Aunt     No Known Problems Maternal Uncle     No Known Problems Paternal Aunt     No Known Problems Paternal Uncle     Anesthesia problems Neg Hx     Broken bones Neg Hx     Cancer Neg Hx     Clotting disorder Neg Hx     Collagen disease Neg Hx     Diabetes Neg Hx     Dislocations Neg Hx     Fibromyalgia Neg Hx     Gout Neg Hx     Hemophilia Neg Hx     Osteoporosis Neg Hx     Rheumatologic disease Neg Hx     Scoliosis Neg Hx     Severe sprains Neg Hx     Sickle cell anemia Neg Hx     Spinal Compression Fracture Neg Hx     Melanoma Neg Hx     Basal cell carcinoma Neg Hx     Squamous cell carcinoma Neg Hx        Surgical History     Past Surgical History:   Procedure Laterality Date    COLON SURGERY      cyroablation      cystis removal      desmoid removal      PR CLOSE ENTEROSTOMY,RESEC+ANAST N/A 10/09/2020    Procedure: ILEOSTOMY TAKEDOWN;  Surgeon: Mickle Asper, MD;  Location: OR [place];  Service: General Surgery    PR COLONOSCOPY W/BIOPSY SINGLE/MULTIPLE N/A 10/27/2012    Procedure: COLONOSCOPY, FLEXIBLE, PROXIMAL TO SPLENIC FLEXURE; WITH BIOPSY, SINGLE OR MULTIPLE;  Surgeon: Shirlyn Goltz Mir, MD;  Location: PEDS PROCEDURE ROOM [place];  Service: Gastroenterology    PR COLONOSCOPY W/BIOPSY SINGLE/MULTIPLE N/A 09/14/2013    Procedure: COLONOSCOPY, FLEXIBLE, PROXIMAL TO SPLENIC FLEXURE; WITH BIOPSY, SINGLE OR MULTIPLE;  Surgeon: Shirlyn Goltz Mir, MD;  Location: PEDS PROCEDURE ROOM [place];  Service: Gastroenterology    PR COLONOSCOPY W/BIOPSY SINGLE/MULTIPLE N/A 11/08/2014    Procedure: COLONOSCOPY, FLEXIBLE, PROXIMAL TO SPLENIC FLEXURE; WITH BIOPSY, SINGLE OR MULTIPLE;  Surgeon: Arnold Long Mir, MD;  Location: PEDS PROCEDURE ROOM [facility];  Service: Gastroenterology    PR COLONOSCOPY W/BIOPSY SINGLE/MULTIPLE N/A 12/26/2015    Procedure: COLONOSCOPY, FLEXIBLE, PROXIMAL TO SPLENIC FLEXURE; WITH BIOPSY, SINGLE OR MULTIPLE;  Surgeon: Arnold Long Mir, MD;  Location: PEDS PROCEDURE ROOM [facility];  Service: Gastroenterology    PR COLONOSCOPY W/BIOPSY SINGLE/MULTIPLE N/A 09/02/2017    Procedure: COLONOSCOPY, FLEXIBLE, PROXIMAL TO SPLENIC FLEXURE; WITH BIOPSY, SINGLE OR MULTIPLE;  Surgeon: Arnold Long Mir, MD;  Location: PEDS PROCEDURE ROOM ;  Service: Gastroenterology    PR COLSC FLX W/REMOVAL LESION BY HOT BX FORCEPS N/A 08/27/2016    Procedure: COLONOSCOPY, FLEXIBLE, PROXIMAL TO SPLENIC FLEXURE; W/REMOVAL TUMOR/POLYP/OTHER LESION, HOT BX FORCEP/CAUTE;  Surgeon: Arnold Long Mir, MD;  Location: PEDS PROCEDURE ROOM [facility];  Service: Gastroenterology    PR COLSC FLX W/RMVL OF TUMOR POLYP  LESION SNARE TQ N/A 02/25/2019    Procedure: COLONOSCOPY FLEX; W/REMOV TUMOR/LES BY SNARE;  Surgeon: Helyn Numbers, MD;  Location: GI PROCEDURES [place] [facility];  Service: Gastroenterology    PR COLSC FLX W/RMVL OF TUMOR POLYP LESION SNARE TQ N/A 03/13/2020    Procedure: COLONOSCOPY FLEX; W/REMOV TUMOR/LES BY SNARE;  Surgeon: Helyn Numbers, MD;  Location: [facility] PROCEDURES [place] [facility];  Service: Gastroenterology    PR EXC SKIN BENIG 2.1-3 CM TRUNK,ARM,LEG Right 02/25/2017    Procedure: EXCISION, BENIGN LESION INCLUDE MARGINS, EXCEPT SKIN TAG, LEGS; EXCISED DIAMETER 2.1 TO 3.0 CM;  Surgeon: Clarene Duke, MD;  Location: CHILDRENS OR [facility];  Service: Plastics    PR EXC SKIN BENIG 3.1-4 CM TRUNK,ARM,LEG Right 02/25/2017    Procedure: EXCISION, BENIGN LESION INCLUDE MARGINS, EXCEPT SKIN TAG, ARMS; EXCISED DIAMETER 3.1 TO 4.0 CM;  Surgeon: Clarene Duke, MD;  Location: CHILDRENS OR [facility];  Service: Plastics    PR EXC SKIN BENIG >4 CM FACE,FACIAL Right 02/25/2017 Procedure: EXCISION, OTHER BENIGN LES INCLUD MARGIN, FACE/EARS/EYELIDS/NOSE/LIPS/MUCOUS MEMBRANE; EXCISED DIAM >4.0 CM;  Surgeon: Clarene Duke, MD;  Location: CHILDRENS OR [facility];  Service: Plastics    PR EXC TUMOR SOFT TISSUE LEG/ANKLE SUBQ 3+CM Right 08/05/2019    Procedure: EXCISION, TUMOR, SOFT TISSUE OF LEG OR ANKLE AREA, SUBCUTANEOUS; 3 CM OR GREATER;  Surgeon: Arsenio Katz, MD;  Location: MAIN OR [place];  Service: Plastics    PR EXC TUMOR SOFT TISSUE LEG/ANKLE SUBQ <3CM Right 08/05/2019    Procedure: EXCISION, TUMOR, SOFT TISSUE OF LEG OR ANKLE AREA, SUBCUTANEOUS; LESS THAN 3 CM;  Surgeon: Arsenio Katz, MD;  Location: MAIN OR [facility];  Service: Plastics    PR LAP, SURG PROCTECTOMY W J-POUCH N/A 08/10/2020    Procedure: ROBOTIC ASSISTED LAPAROSCOPIC PROCTOCOLECTOMY, ILEAL J POUCH, WITH OSTOMY;  Surgeon: Mickle Asper, MD;  Location: OR [place];  Service: General Surgery    PR NDSC EVAL INTSTINAL POUCH DX W/COLLJ SPEC SPX N/A 01/23/2021    Procedure: ENDO EVAL SM INTEST POUCH; DX;  Surgeon: Modena Nunnery, MD;  Location: GI PROCEDURES [place] [facility];  Service: Gastroenterology    PR NDSC EVAL INTSTINAL POUCH DX W/COLLJ SPEC SPX N/A 08/27/2021    Procedure: ENDO EVAL SM INTEST POUCH; DX;  Surgeon: Hunt Oris, MD;  Location: [facility] PROC[facility]URES [facility];  Service: Gastroenterology    PR NDSC EVAL INTSTINAL POUCH DX W/COLLJ SPEC SPX N/A 12/09/2021    Procedure: ENDO EVAL SM INTEST POUCH; DX;  Surgeon: Vidal Schwalbe, MD;  [facility]: GI PROCEDURES [facility];  Service: Gastroenterology    PR NDSC EVAL INTSTINAL POUCH DX W/COLLJ [facility] SPX Left 04/09/2022    Procedure: ENDO EVAL SM INTEST POUCH; DX;  Surgeon: Modena Nunnery, MD;  Location: [place] PROCEDURES [place] [facility];  Service: Gastroenterology    PR NDSC EVAL INTSTINAL POUCH DX W/COLLJ SPEC SPX N/A 08/05/2022    Procedure: ENDO EVAL SM INTEST POUCH; DX;  Surgeon: Modena Nunnery, MD;  Location: GI PROCEDURES [facility];  Service: Gastroenterology    PR NDSC EVAL INTSTINAL POUCH W/BX SINGLE/MULTIPLE N/A 01/20/2022    Procedure: ENDOSCOPIC EVAL OF SMALL INTESTINAL POUCH; DIAGNOSTIC, No biopsies;  Surgeon: Andrey Farmer, MD;  Location: GI PROCEDURES [facility];  Service: Gastroenterology    PR NDSC EVAL INTSTINAL POUCH W/BX SINGLE/MULTIPLE N/A 02/13/2022    Procedure: ENDOSCOPIC EVAL OF SMALL INTESTINAL POUCH; DIAGNOSTIC, WITH BIOPSY;  Surgeon: Bronson Curb, MD;  Location: GI PROCEDURES [facility];  Service: Gastroenterology    PR UNLISTED PROCEDURE SMALL INTESTINE  01/23/2021    Procedure: UNLISTED PROCEDURE, SMALL INTESTINE;  Surgeon: Modena Nunnery, MD;  Location: [facility] PROCEDURES [place] [facility];  Service: Gastroenterology    PR UNLISTED PROCEDURE SMALL INTESTINE  02/13/2022    Procedure: UNLISTED PROCEDURE, SMALL INTESTINE;  Surgeon: Bronson Curb, MD;  Location: GI PROCEDURES [facility];  Service: Gastroenterology    PR UPPER GI ENDOSCOPY,BIOPSY N/A 10/27/2012    Procedure: UGI ENDOSCOPY; WITH BIOPSY, SINGLE OR MULTIPLE;  Surgeon: Shirlyn Goltz Mir, MD;  Location: PEDS PROCEDURE ROOM [facility];  Service: Gastroenterology    PR UPPER GI ENDOSCOPY,BIOPSY N/A 09/14/2013    Procedure: UGI ENDOSCOPY; WITH BIOPSY, SINGLE OR MULTIPLE;  Surgeon: Shirlyn Goltz Mir, MD;  Location: PEDS PROCEDURE ROOM [facility];  Service: Gastroenterology    PR UPPER GI ENDOSCOPY,BIOPSY N/A 11/08/2014    Procedure: UGI ENDOSCOPY; WITH BIOPSY, SINGLE OR MULTIPLE;  Surgeon: Arnold Long Mir, MD;  Location: PEDS PROCEDURE ROOM [facility];  Service: Gastroenterology    PR UPPER GI ENDOSCOPY,BIOPSY N/A 12/26/2015    Procedure: UGI ENDOSCOPY; WITH BIOPSY, SINGLE OR MULTIPLE;  Surgeon: Arnold Long Mir, MD;  Location: PEDS PROCEDURE ROOM [facility];  Service: Gastroenterology    PR UPPER GI ENDOSCOPY,BIOPSY N/A 08/27/2016    Procedure: UGI ENDOSCOPY; WITH BIOPSY, SINGLE OR MULTIPLE;  Surgeon: Arnold Long Mir, MD;  Location: PEDS PROCEDURE ROOM [facility];  Service: Gastroenterology    PR UPPER GI ENDOSCOPY,BIOPSY N/A 09/02/2017    Procedure: UGI ENDOSCOPY; WITH BIOPSY, SINGLE OR MULTIPLE;  Surgeon: Arnold Long Mir, MD;  Location: PEDS PROCEDURE ROOM [facility];  Service: Gastroenterology    PR UPPER GI ENDOSCOPY,BIOPSY N/A 03/13/2020    Procedure: UGI ENDOSCOPY; WITH BIOPSY, SINGLE OR MULTIPLE;  Surgeon: Helyn Numbers, MD;  Location: [facility] PROCEDURES [place] [facility];  Service: Gastroenterology    PR UPPER GI ENDOSCOPY,BIOPSY N/A 09/05/2021    Procedure: UGI ENDOSCOPY; WITH BIOPSY, SINGLE OR MULTIPLE;  Surgeon: Wendall Papa, MD;  Location: GI PROCEDURES [facility];  Service: Gastroenterology    PR UPPER GI ENDOSCOPY,DIAGNOSIS N/A 01/20/2022    Procedure: UGI ENDO, INCLUDE ESOPHAGUS, STOMACH, & DUODENUM &/OR JEJUNUM; DX W/WO COLLECTION SPECIMN, BY BRUSH OR WASH;  Surgeon: Andrey Farmer, MD;  Location: GI PROCEDURES [facility];  Service: Gastroenterology    TUMOR REMOVAL      multiple-head, neck, back, hand, right flank, multiple

## 2022-09-26 NOTE — Unmapped (Signed)
VIR Inpatient Progress Note    Service Date: 09/26/2022  Admit Date: 09/25/2022, Hospital Day: 2  Hospital Service: [facility] [place] [facility])  Attending: Rocco Serene, MD    Assessment     [name] [name] is a 22 y.o. female with h/o [name], [name] who underwent right forearm and left chest desmoid tumor steroid/anesthetic injection yesterday 7/18.     Interval Events/Subjective   Patient reports poor sleep overnight due to shaking and stiffness. She states that she has abdominal pain that she suspects is due to being tense and irritating her right rectus abdominis mass. She has not had anything to eat this morning. Her pain has been controlled on PO pain meds so far today but had just requested a dose of IV pain medicine for abdominal pain.    Plan   - Continue to wean pain meds. As soon as patient's pain is controlled on PO pain meds and she is able to tolerate food intake she is OK to discharge from VIR standpoint.    Objective     Vitals:   Temp:  [36.3 ??C (97.4 ??F)-36.8 ??C (98.2 ??F)] 36.6 ??C (97.9 ??F)  Heart Rate:  [72-125] 72  Resp:  [14-46] 18  BP: (99-128)/(53-110) 104/63  MAP (mmHg):  [65-117] 73  SpO2:  [88 %-99 %] 98 %  BMI (Calculated):  [22.9] 22.9    Lab Results   Component Value Date    WBC 5.9 09/16/2022    HGB 11.6 09/16/2022    HCT 34.8 09/16/2022    PLT 350 09/16/2022       Lab Results   Component Value Date    NA 143 09/16/2022    K 3.6 09/16/2022    CL 111 (H) 09/16/2022    CO2 26.0 09/16/2022    BUN 9 09/16/2022    CREATININE 0.63 09/16/2022    GLU 109 09/16/2022    CALCIUM 9.3 09/16/2022    MG 2.1 08/20/2022    PHOS 4.7 08/15/2022       Lab Results   Component Value Date    BILITOT 0.4 09/16/2022    BILIDIR <0.10 08/08/2022    PROT 7.0 09/16/2022    ALBUMIN 3.8 09/16/2022    ALT 12 09/16/2022    AST 17 09/16/2022    ALKPHOS 65 09/16/2022    GGT 13 10/31/2011       Lab Results   Component Value Date    PT 11.8 07/21/2022    INR 1.06 07/21/2022    APTT 57.7 (H) 05/02/2022 Physical Exam:  -General:  Appropriate, comfortable and in no apparent distress.   -Neurological: Moves all 4 extremities spontaneously.   -Pulmonary: Normal work of breathing.   -Abdomen: Soft, non-tender, non-distended. No rebound or guarding.    Everrett Coombe, MD  VIR PGY-6

## 2022-09-26 NOTE — Unmapped (Signed)
ICU TRANSPORT NOTE    Destination: CT    Departing Unit: MPCU  Pickup Time: 1315    Return Unit: MPCU  Return Time: 1340    [place] patient ID band verified  Allergies Reviewed  Code Status at time of transport: Full    Report received from primary nurse via [employer]. Handoff performed of continuous drip/infusion Patient transported via stretcher under stepdown level of care. See vital signs during transport via [employer]. O2 via RA. Patient is alert, following commands, and oriented. Patient tolerated procedure/scan well. Universal precautions maintained throughout transport.    Update and care given to primary nurse. See Doc Flowsheets/MAR for additional transportation documentation. Proper body mechanics and safe patient handling equipment were utilized throughout transport.

## 2022-09-26 NOTE — Unmapped (Signed)
Adult Nutrition Assessment Note    Visit Type: RN Consult  Reason for Visit: Assessment (Nutrition)    HPI & PMH:  [name] [name] is a 22 y.o. 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     Anthropometric Data:  Height: 165.1 cm (5' 5)   Admission weight: 62.4 kg (137 lb 9.6 oz)  Last recorded weight: 62.4 kg (137 lb 9.6 oz)  IBW: 56.75 kg  Percent IBW: 109.98 %  BMI: Body mass index is 22.9 kg/m??.   Usual Body Weight:  Pt notes that she gained +30# while on home TPN; she does not want to maintain that weight. She states that her weight has been stable up until the previous two weeks.    Weight history prior to admission:  -7# in 2 weeks by her report (-5% in 2 weeks, significant). Wt history is stable per EMR data.     Wt Readings from Last 10 Encounters:   09/25/22 62.4 kg (137 lb 9.6 oz)   09/24/22 62.4 kg (137 lb 9.6 oz)   09/16/22 62 kg (136 lb 11.2 oz)   09/02/22 63 kg (139 lb)   08/05/22 62.1 kg (137 lb)   07/18/22 62.8 kg (138 lb 6.4 oz)   07/16/22 62.1 kg (137 lb)   07/14/22 62.9 kg (138 lb 9.6 oz)   06/15/22 63.6 kg (140 lb 3.4 oz)   06/06/22 60.3 kg (132 lb 14.4 oz)        Weight changes this admission:   Last 5 Recorded Weights    09/25/22 2342   Weight: 62.4 kg (137 lb 9.6 oz)        Nutrition Focused Physical Exam:  Unable to complete at this time due to patient's pain level       NUTRITIONALLY RELEVANT DATA     Medications:   Nutritionally pertinent medications reviewed and evaluated for potential food and/or medication interactions.     Scheduled Meds: lactated ringers (held)    PRN Meds: Zofran (07/19)    Labs:   Nutritionally pertinent labs reviewed.     No new labs to review since 07/09    Nutrition History:   September 26, 2022: Prior to admission:  Pt states that she has been doing well with oral nutrition until Saturday when po intake declined prior to this admission. She does not drink oral supplements at home but would accept them during this admission with evidence for continued poor po intake of meals.  Of note pt has a complex nutrition history in the setting of Gardner's Syndrome including recently requiring TPN/EN to meet estimated needs. She was recently admited to [facility] in June 2024 where she stated she has been prioritizing adequate nutrition to avoid having to resume TPN. No percent po intake of meal data to review with EMR data. Pt was advised not to take po food or water in preparation for a procedure. She endorses continued nausea without vomiting. Pt reports BM today 07/19.     Allergies, Intolerances, Sensitivities, and/or Cultural/Religious Dietary Restrictions: include papaya    Current Nutrition:  Oral intake     Nutrition Orders            Nutrition Therapy Regular/House starting at 07/18 1847            Nutritional Needs:   Healthy balance of carbohydrate, protein, and fat.  Malnutrition assessment not yet completed at this time due to inability to complete nutrition focused physical exam (NFPE).    GOALS and EVALUATION     Patient to consume 75% or greater of po intake via combination of meals, snacks, and/or oral supplements within admission.  - New    Motivation, Barriers, and Compliance:  Evaluation of motivation, barriers, and compliance completed. No concerns identified at this time.     NUTRITION ASSESSMENT     Current nutrition therapy is appropriate and progressing toward meeting meeting nutritional needs at this time.   Patient would benefit from start of oral supplement to better meet nutritional needs.  Continued nausea without vomiting at this time.      Discharge Planning:   Monitor for potential discharge needs with multi-disciplinary team.     Was the nutrition care plan completed? No, unable to diagnose malnutrition at this time       NUTRITION INTERVENTIONS and RECOMMENDATION Continue regular diet as able.  Trial Ensure Plus HP BID to support intake of meals  Consider scheduled antiemetic.   Weekly weights to trend.    Follow-Up Parameters:   1-2 times per week (and more frequent as indicated)    Earney Hamburg Ph.D, RD, LDN  [facility] Clinical Dietitian

## 2022-09-26 NOTE — Unmapped (Signed)
Care Management  Initial Transition Planning Assessment              General  Care Manager assessed the patient by : In person interview with patient, Medical record review, Discussion with Clinical Care team  Orientation Level: Oriented X4  Functional level prior to admission: Independent  Reason for referral: Discharge Planning    Contact/Decision Maker  Extended Emergency Contact Information  Primary Emergency Contact: Yilmaz,[name]   [place]  Home Phone: [phone]  Mobile Phone: [phone]  Relation: Mother  Secondary Emergency Contact: Bowersox,Jeff  Mobile Phone: [phone]  Relation: Father    Legal Next of Kin / Guardian / POA / Advance Directives     HCDM ([place] policy, no known patient preference): Cinco,[name] - Mother - [phone]    HCDM, back-up (If primary HCDM is unavailable): Rauen,Jeff - Father - [phone]    Advance Directive (Medical Treatment)  Does patient have an advance directive covering medical treatment?: Patient does not have advance directive covering medical treatment.  Reason patient does not have an advance directive covering medical treatment:: Patient does not wish to complete one at this time.    Health Care Decision Maker [HCDM] (Medical & Mental Health Treatment)  Healthcare Decision Maker: HCDM documented in the HCDM/Contact Info section.  Information offered on HCDM, Medical & Mental Health advance directives:: Patient given information.         Readmission Information    Have you been hospitalized in the last 30 days?: No           Did the following happen with your discharge?           Patient Information  Lives with: Parent    Type of Residence: Private residence (2 level home)        Location/Detail: [address] [place] [address]    Support Systems/Concerns: Parent    Responsibilities/Dependents at home?: No    Home Care services in place prior to admission?: Yes  Type of Home Care services in place prior to admission: Home PT, Home infusion  Current Home Care provider (Name/Phone #): [facility] [employer] [facility]            Equipment Currently Used at Home: walker, rolling       Currently receiving outpatient dialysis?: No       Financial Information       Need for financial assistance?: No       Social Determinants of Health  Social Determinants of Health     Financial Resource Strain: Low Risk  (09/26/2022)    Overall Financial Resource Strain (CARDIA)     Difficulty of Paying Living Expenses: Not very hard   Internet Connectivity: Not on file   Food Insecurity: No Food Insecurity (09/26/2022)    Hunger Vital Sign     Worried About Running Out of Food in the Last Year: Never true     Ran Out of Food in the Last Year: Never true   Tobacco Use: Low Risk  (09/24/2022)    Patient History     Smoking Tobacco Use: Never     Smokeless Tobacco Use: Never     Passive Exposure: Past   Housing/Utilities: Low Risk  (09/26/2022)    Housing/Utilities     Within the past 12 months, have you ever stayed: outside, in a car, in a tent, in an overnight shelter, or temporarily in someone else's home (i.e. couch-surfing)?: No     Are you worried  about losing your housing?: No     Within the past 12 months, have you been unable to get utilities (heat, electricity) when it was really needed?: No   Alcohol Use: Not on file   Transportation Needs: No Transportation Needs (09/26/2022)    PRAPARE - Transportation     Lack of Transportation (Medical): No     Lack of Transportation (Non-Medical): No   Substance Use: Not on file   Health Literacy: Not on file   Physical Activity: Not on file   Interpersonal Safety: Unknown (09/26/2022)    Interpersonal Safety     Unsafe Where You Currently Live: Not on file     Physically Hurt by Anyone: Not on file     Abused by Anyone: Not on file   Stress: Not on file   Intimate Partner Violence: Not on file   Depression: Not at risk (01/25/2022)    Received from [facility]    PHQ-2   Social Connections: Not on file Complex Discharge Information    Is patient identified as a difficult/complex discharge?: No            Discharge Needs Assessment  Concerns to be Addressed: care coordination/care conferences, discharge planning    Clinical Risk Factors:      Barriers to taking medications: No    Prior overnight hospital stay or ED visit in last 90 days: Yes       Equipment Needed After Discharge: other (see comments) (CM will follow for DME needs)    Discharge Facility/Level of Care Needs: other (see comments) (CM will follow for discharge needs)    Readmission  Risk of Unplanned Readmission Score:  %  Predictive Model Details   No score data available for [facility] Risk of Unplanned Readmission     Readmitted Within the Last 30 Days? (No if blank)   Patient at risk for readmission?: Yes    Discharge Plan  Screen findings are: Discharge planning needs identified or anticipated (Comment). (CM will follow for discharge needs)    Expected Discharge Date:     Expected Transfer from Critical Care:                 Initial Assessment complete?: Yes

## 2022-09-26 NOTE — Unmapped (Signed)
Pt admitted to [facility] at 2033 along side her mother and transport staff.  Pt alert and oriented x4, VSS. Pt reported 8/10 pain in abdomen, PRN oxycodone given with relief.  Pt was oriented to her room, given a fresh cup of water and encouraged to call staff if she need anything.  Call light and bed side table within reach.  Problem: Adult Inpatient Plan of Care  Goal: Plan of Care Review  09/26/2022 0403 by Crista Elliot, RN  Outcome: Ongoing - Unchanged  Flowsheets (Taken 09/26/2022 0403)  Progress: no change  09/26/2022 0401 by Crista Elliot, RN  Flowsheets (Taken 09/26/2022 0401)  Progress: no change  Plan of Care Reviewed With: patient  Goal: Patient-Specific Goal (Individualized)  09/26/2022 0403 by Crista Elliot, RN  Outcome: Ongoing - Unchanged  09/26/2022 0401 by Crista Elliot, RN  Reactivated  Goal: Absence of Hospital-Acquired Illness or Injury  09/26/2022 0403 by Crista Elliot, RN  Outcome: Ongoing - Unchanged  09/26/2022 0401 by Crista Elliot, RN  Reactivated  Intervention: Identify and Manage Fall Risk  Recent Flowsheet Documentation  Taken 09/26/2022 0000 by Crista Elliot, RN  Safety Interventions: environmental modification  Taken 09/25/2022 2244 by Crista Elliot, RN  Safety Interventions:   lighting adjusted for tasks/safety   low bed  Taken 09/25/2022 2008 by Crista Elliot, RN  Safety Interventions:   lighting adjusted for tasks/safety   low bed   environmental modification  Goal: Optimal Comfort and Wellbeing  09/26/2022 0403 by Crista Elliot, RN  Outcome: Ongoing - Unchanged  09/26/2022 0401 by Crista Elliot, RN  Reactivated  Goal: Readiness for Transition of Care  09/26/2022 0403 by Crista Elliot, RN  Outcome: Ongoing - Unchanged  09/26/2022 0401 by Crista Elliot, RN  Reactivated  Intervention: Mutually Develop Transition Plan  Recent Flowsheet Documentation  Taken 09/25/2022 2244 by Crista Elliot, RN  Transportation Anticipated: family or friend will provide  Goal: Rounds/Family Conference  09/26/2022 0403 by Crista Elliot, RN  Outcome: Ongoing - Unchanged  09/26/2022 0401 by Crista Elliot, RN  Reactivated     Problem: Latex Allergy  Goal: Absence of Allergy Symptoms  09/26/2022 0403 by Crista Elliot, RN  Outcome: Ongoing - Unchanged  09/26/2022 0401 by Crista Elliot, RN  Reactivated

## 2022-09-26 NOTE — Unmapped (Signed)
Trauma Surgery Consult Note  Date: 09/26/2022  Consulting attending: Dr. Dolphus Jenny  Requesting service: [facility]  Requesting physician: Rocco Serene, MD    Assessment:    Patient is a 23 yo F with h/o [name], [name] that has led to a complex series of hospital presentations including a total colectomy, excision of multiple desmoid tumors, and who most recently underwent injection of steroids into desmoid lesions in her RUE and Left chest on 7/19 with VIR. Post anesthesia, patient began to have diffuse muscle rigidity and shakiness. There was no associated fevers. She complains of abdominal pain associated with very tense rectus abdominis. Lactate was 8.3. General surgery was consulted to rule out abdominal compartment syndrome. CT A/P showed trace free fluid in the right upper quadrant adjacent to the liver, of uncertain etiology; and concern for possible ileus but no concern for compartment syndrome. Patient still retains desmoid tumor on right abdomen. This correlates with physical exam of pain and rigidity on RLQ. Concern that her presentation could be drug-induced, vs reaction superimposed by scattered intraabdominal lesions and symptoms.    Recommendations:    - No surgical indication at this time, new lactate now 6.4  - Patient states that this reaction is typical for her after anesthesia. Although, no explanation has been documented for these occurrences. Continuation of adequate workup and investigation per primary team.  - Concern for ileus vs SBO on CT A/P. Patient is not showing clinical signs of obstruction, hence, no acute intervention is necessary for now.    Thank you for inviting [name] to assist in the care of your patient. Please feel free to page the Trauma/Acute Care Surgery consult pager at [phone] with any questions or changes in clinical condition.     This patient was discussed and evaluated with Dr. Dolphus Jenny (Attending) who agrees with the plan.                             History of Present Illness:  Patient is a 22 yo F with h/o [name], [name] that has led to a complex series of hospital presentations including a total colectomy, excision of multiple desmoid tumors, and who most recently underwent injection of steroids into desmoid lesions in her RUE and Left chest on 7/19 with VIR. Post anesthesia, patient began to have diffuse muscle rigidity and shakiness. She states that she typically has these episodes after anesthesia and no reason exists to explain why. She denies any fever or chills. She still endorses tense abdomen but is stable. She also reports yearly monitoring of her thyroid due to known nodules.        Past Medical History:  Past Medical History:   Diagnosis Date    Abdominal pain     Acid reflux     occas    Anesthesia complication     itching, shaking, coldness; last few surgeries have gone much better    Cancer (CMS-HCC)     Cataract of right eye     COVID-19 virus infection 01/2019    Cyst of thyroid determined by ultrasound     monitoring    [name]     2 right forearm, 1 left thigh, 1 right scapula, 1 under left clavicle; multiple    Difficult intravenous access     [name] (familial adenomatous polyposis)     [name]     Gastric polyps     History of chemotherapy  last treatment approx 05/2019    History of colon polyps     History of COVID-19 01/2019    Iron deficiency anemia due to chronic blood loss     received iron infusion 11-2019    PONV (postoperative nausea and vomiting)     Rectal bleeding     Syncopal episodes     especially if becoming dehydrated       Past Surgical History:   Past Surgical History:   Procedure Laterality Date    COLON SURGERY      cyroablation      cystis removal      desmoid removal      PR CLOSE ENTEROSTOMY,RESEC+ANAST N/A 10/09/2020    Procedure: ILEOSTOMY TAKEDOWN;  Surgeon: Mickle Asper, MD;  Location: OR [place];  Service: General Surgery    PR COLONOSCOPY W/BIOPSY SINGLE/MULTIPLE N/A 10/27/2012    Procedure: COLONOSCOPY, FLEXIBLE, PROXIMAL TO SPLENIC FLEXURE; WITH BIOPSY, SINGLE OR MULTIPLE;  Surgeon: Shirlyn Goltz Mir, MD;  Location: PEDS PROCEDURE ROOM [facility];  Service: Gastroenterology    PR COLONOSCOPY W/BIOPSY SINGLE/MULTIPLE N/A 09/14/2013    Procedure: COLONOSCOPY, FLEXIBLE, PROXIMAL TO SPLENIC FLEXURE; WITH BIOPSY, SINGLE OR MULTIPLE;  Surgeon: Shirlyn Goltz Mir, MD;  Location: PEDS PROCEDURE ROOM [place];  Service: Gastroenterology    PR COLONOSCOPY W/BIOPSY SINGLE/MULTIPLE N/A 11/08/2014    Procedure: COLONOSCOPY, FLEXIBLE, PROXIMAL TO SPLENIC FLEXURE; WITH BIOPSY, SINGLE OR MULTIPLE;  Surgeon: Arnold Long Mir, MD;  Location: PEDS PROCEDURE ROOM [facility];  Service: Gastroenterology    PR COLONOSCOPY W/BIOPSY SINGLE/MULTIPLE N/A 12/26/2015    Procedure: COLONOSCOPY, FLEXIBLE, PROXIMAL TO SPLENIC FLEXURE; WITH BIOPSY, SINGLE OR MULTIPLE;  Surgeon: Arnold Long Mir, MD;  Location: PEDS PROCEDURE ROOM [facility];  Service: Gastroenterology    PR COLONOSCOPY W/BIOPSY SINGLE/MULTIPLE N/A 09/02/2017    Procedure: COLONOSCOPY, FLEXIBLE, PROXIMAL TO SPLENIC FLEXURE; WITH BIOPSY, SINGLE OR MULTIPLE;  Surgeon: Arnold Long Mir, MD;  Location: PEDS PROCEDURE ROOM [place];  Service: Gastroenterology    PR COLSC FLX W/REMOVAL LESION BY HOT BX FORCEPS N/A 08/27/2016    Procedure: COLONOSCOPY, FLEXIBLE, PROXIMAL TO SPLENIC FLEXURE; W/REMOVAL TUMOR/POLYP/OTHER LESION, HOT BX FORCEP/CAUTE;  Surgeon: Arnold Long Mir, MD;  Location: PEDS PROCEDURE ROOM [facility];  Service: Gastroenterology    PR COLSC FLX W/RMVL OF TUMOR POLYP LESION SNARE TQ N/A 02/25/2019    Procedure: COLONOSCOPY FLEX; W/REMOV TUMOR/LES BY SNARE;  Surgeon: Helyn Numbers, MD;  Location: [facility] PROCEDURES [place] [facility];  Service: Gastroenterology    PR COLSC FLX W/RMVL OF TUMOR POLYP LESION SNARE TQ N/A 03/13/2020    Procedure: COLONOSCOPY FLEX; W/REMOV TUMOR/LES BY SNARE;  Surgeon: Helyn Numbers, MD;  Location: [facility] PROCEDURES [place] [facility];  Service: Gastroenterology    PR EXC SKIN BENIG 2.1-3 CM TRUNK,ARM,LEG Right 02/25/2017    Procedure: EXCISION, BENIGN LESION INCLUDE MARGINS, EXCEPT SKIN TAG, LEGS; EXCISED DIAMETER 2.1 TO 3.0 CM;  Surgeon: Clarene Duke, MD;  Location: CHILDRENS OR [facility];  Service: Plastics    PR EXC SKIN BENIG 3.1-4 CM TRUNK,ARM,LEG Right 02/25/2017    Procedure: EXCISION, BENIGN LESION INCLUDE MARGINS, EXCEPT SKIN TAG, ARMS; EXCISED DIAMETER 3.1 TO 4.0 CM;  Surgeon: Clarene Duke, MD;  Location: CHILDRENS OR [facility];  Service: Plastics    PR EXC SKIN BENIG >4 CM FACE,FACIAL Right 02/25/2017    Procedure: EXCISION, OTHER BENIGN LES INCLUD MARGIN, FACE/EARS/EYELIDS/NOSE/LIPS/MUCOUS MEMBRANE; EXCISED DIAM >4.0 CM;  Surgeon: Clarene Duke, MD;  Location: CHILDRENS OR [facility];  Service: Plastics    PR EXC TUMOR SOFT TISSUE  LEG/ANKLE SUBQ 3+CM Right 08/05/2019    Procedure: EXCISION, TUMOR, SOFT TISSUE OF LEG OR ANKLE AREA, SUBCUTANEOUS; 3 CM OR GREATER;  Surgeon: Arsenio Katz, MD;  Location: MAIN OR [place];  Service: Plastics    PR EXC TUMOR SOFT TISSUE LEG/ANKLE SUBQ <3CM Right 08/05/2019    Procedure: EXCISION, TUMOR, SOFT TISSUE OF LEG OR ANKLE AREA, SUBCUTANEOUS; LESS THAN 3 CM;  Surgeon: Arsenio Katz, MD;  Location: MAIN OR [facility];  Service: Plastics    PR LAP, SURG PROCTECTOMY W J-POUCH N/A 08/10/2020    Procedure: ROBOTIC ASSISTED LAPAROSCOPIC PROCTOCOLECTOMY, ILEAL J POUCH, WITH OSTOMY;  Surgeon: Mickle Asper, MD;  Location: OR [place];  Service: General Surgery    PR NDSC EVAL INTSTINAL POUCH DX W/COLLJ SPEC SPX N/A 01/23/2021    Procedure: ENDO EVAL SM INTEST POUCH; DX;  Surgeon: Modena Nunnery, MD;  Location: [facility] PROCEDURES [place] [facility];  Service: Gastroenterology    PR NDSC EVAL INTSTINAL POUCH DX W/COLLJ SPEC SPX N/A 08/27/2021    Procedure: ENDO EVAL SM INTEST POUCH; DX;  Surgeon: Hunt Oris, MD;  [facility]: [facility];  Service: Gastroenterology    PR NDSC EVAL INTSTINAL POUCH DX W/COLLJ SPEC SPX N/A 12/09/2021    Procedure: ENDO EVAL SM INTEST POUCH; DX;  Surgeon: Vidal Schwalbe, MD;  Location: [facility]URES [facility];  Service: Gastroenterology    PR NDSC EVAL INTSTINAL POUCH DX W/COLLJ [facility] SPX Left 04/09/2022    Procedure: ENDO EVAL SM INTEST POUCH; DX;  Surgeon: Modena Nunnery, MD;  Location: [facility] [place] [facility];  Service: Gastroenterology    PR NDSC EVAL INTSTINAL POUCH DX W/COLLJ SPEC SPX N/A 08/05/2022    Procedure: ENDO EVAL SM INTEST POUCH; DX;  Surgeon: Modena Nunnery, MD;  Location: GI PROCEDURES [facility];  Service: Gastroenterology    PR NDSC EVAL INTSTINAL POUCH W/BX SINGLE/MULTIPLE N/A 01/20/2022    Procedure: ENDOSCOPIC EVAL OF SMALL INTESTINAL POUCH; DIAGNOSTIC, No biopsies;  Surgeon: Andrey Farmer, MD;  Location: GI PROCEDURES [facility];  Service: Gastroenterology    PR NDSC EVAL INTSTINAL POUCH W/BX SINGLE/MULTIPLE N/A 02/13/2022    Procedure: ENDOSCOPIC EVAL OF SMALL INTESTINAL POUCH; DIAGNOSTIC, WITH BIOPSY;  Surgeon: Bronson Curb, MD;  Location: [facility]URES [facility];  Service: Gastroenterology    PR UNLISTED PROCEDURE SMALL INTESTINE  01/23/2021    Procedure: UNLISTED PROCEDURE, SMALL INTESTINE;  Surgeon: Modena Nunnery, MD;  Location: [facility] [place] [facility];  Service: Gastroenterology    PR UNLISTED PROCEDURE SMALL INTESTINE  02/13/2022    Procedure: UNLISTED PROCEDURE, SMALL INTESTINE;  Surgeon: Bronson Curb, MD;  Location: GI PROC[facility]URES [facility];  Service: Gastroenterology    PR UPPER GI ENDOSCOPY,BIOPSY N/A 10/27/2012    Procedure: UGI ENDOSCOPY; WITH BIOPSY, SINGLE OR MULTIPLE;  Surgeon: Shirlyn Goltz Mir, MD;  Location: PEDS PROCEDURE ROOM [facility];  Service: Gastroenterology    PR UPPER GI ENDOSCOPY,BIOPSY N/A 09/14/2013    Procedure: UGI ENDOSCOPY; WITH BIOPSY, SINGLE OR MULTIPLE;  Surgeon: Shirlyn Goltz Mir, MD;  Location: PEDS PROCEDURE ROOM [facility];  Service: Gastroenterology    PR UPPER GI ENDOSCOPY,BIOPSY N/A 11/08/2014    Procedure: UGI ENDOSCOPY; WITH BIOPSY, SINGLE OR MULTIPLE;  Surgeon: Arnold Long Mir, MD;  Location: PEDS PROCEDURE ROOM [facility];  Service: Gastroenterology    PR UPPER GI ENDOSCOPY,BIOPSY N/A 12/26/2015    Procedure: UGI ENDOSCOPY; WITH BIOPSY, SINGLE OR MULTIPLE;  Surgeon: Arnold Long Mir, MD;  Location: PEDS PROCEDURE ROOM [facility];  Service:  Gastroenterology    PR UPPER GI ENDOSCOPY,BIOPSY N/A 08/27/2016    Procedure: UGI ENDOSCOPY; WITH BIOPSY, SINGLE OR MULTIPLE;  Surgeon: Arnold Long Mir, MD;  Location: PEDS PROCEDURE ROOM [facility];  Service: Gastroenterology    PR UPPER GI ENDOSCOPY,BIOPSY N/A 09/02/2017    Procedure: UGI ENDOSCOPY; WITH BIOPSY, SINGLE OR MULTIPLE;  Surgeon: Arnold Long Mir, MD;  Location: PEDS PROCEDURE ROOM [facility];  Service: Gastroenterology    PR UPPER GI ENDOSCOPY,BIOPSY N/A 03/13/2020    Procedure: UGI ENDOSCOPY; WITH BIOPSY, SINGLE OR MULTIPLE;  Surgeon: Helyn Numbers, MD;  Location: [facility] [place] [facility];  Service: Gastroenterology    PR UPPER GI ENDOSCOPY,BIOPSY N/A 09/05/2021    Procedure: UGI ENDOSCOPY; WITH BIOPSY, SINGLE OR MULTIPLE;  Surgeon: Wendall Papa, MD;  Location: [facility]URES [facility];  Service: Gastroenterology    PR UPPER GI ENDOSCOPY,DIAGNOSIS N/A 01/20/2022    Procedure: UGI ENDO, INCLUDE ESOPHAGUS, STOMACH, & DUODENUM &/OR JEJUNUM; DX W/WO COLLECTION SPECIMN, BY BRUSH OR WASH;  Surgeon: Andrey Farmer, MD;  Location: GI PROCEDURES [facility];  Service: Gastroenterology    TUMOR REMOVAL      multiple-head, neck, back, hand, right flank, multiple       Social History:  Social History     Socioeconomic History    Marital status: Single   Tobacco Use    Smoking status: Never     Passive exposure: Past    Smokeless tobacco: Never   Vaping Use    Vaping status: Never Used   Substance and Sexual Activity    Alcohol use: Never    Drug use: Never    Sexual activity: Never   Other Topics Concern    Do you use sunscreen? Yes    Tanning bed use? No    Are you easily burned? No    Excessive sun exposure? No    Blistering sunburns? 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     Social Determinants of Health     Financial Resource Strain: Low Risk  (09/26/2022)    Overall Financial Resource Strain (CARDIA)     Difficulty of Paying Living Expenses: Not very hard   Food Insecurity: No Food Insecurity (09/26/2022)    Hunger Vital Sign     Worried About Running Out of Food in the Last Year: Never true     Ran Out of Food in the Last Year: Never true   Transportation Needs: No Transportation Needs (09/26/2022)    PRAPARE - Therapist, art (Medical): No     Lack of Transportation (Non-Medical): No       Family History:   Family History   Problem Relation Age of Onset    No Known Problems Mother     No Known Problems Father     No Known Problems Sister     No Known Problems Brother     Stroke Maternal Grandmother     Other Maternal Grandmother         benign lesions of liver and pancreas, further details unknown    Cancer Maternal Grandmother     Diabetes Maternal Grandmother     Hypertension Maternal Grandmother     Thyroid disease Maternal Grandmother     Arthritis Maternal Grandfather     Asthma Maternal Grandfather     COPD Paternal Grandmother  Deceased    Miscarriages / Stillbirths Paternal Grandmother     Alcohol abuse Paternal Grandfather         Deceased    No Known Problems Maternal Aunt     No Known Problems Maternal Uncle     No Known Problems Paternal Aunt     No Known Problems Paternal Uncle     Anesthesia problems Neg Hx     Broken bones Neg Hx     Cancer Neg Hx     Clotting disorder Neg Hx     Collagen disease Neg Hx     Diabetes Neg Hx     Dislocations Neg Hx     Fibromyalgia Neg Hx     Gout Neg Hx     Hemophilia Neg Hx     Osteoporosis Neg Hx     Rheumatologic disease Neg Hx     Scoliosis Neg Hx Severe sprains Neg Hx     Sickle cell anemia Neg Hx     Spinal Compression Fracture Neg Hx     Melanoma Neg Hx     Basal cell carcinoma Neg Hx     Squamous cell carcinoma Neg Hx        Medication:  No current facility-administered medications on file prior to encounter.     Current Outpatient Medications on File Prior to Encounter   Medication Sig Dispense Refill    buprenorphine-naloxone (SUBOXONE) 2-0.5 mg sublingual film Place 0.25 Film (0.5 mg of buprenorphine total) under the tongue three (3) times a day. Fill on or after: 09/05/22 22 Film 0    acetaminophen (TYLENOL) 500 MG tablet Take 2 tablets (1,000 mg total) by mouth every eight (8) hours.      ammonium lactate (LAC-HYDRIN) 12 % lotion Apply 1 Application topically two (2) times a day.      baclofen (LIORESAL) 5 mg Tab tablet Take 1 tablet (5 mg total) by mouth Three (3) times a day as needed for muscle spasms. 30 tablet 0    [EXPIRED] bisacodyl (DULCOLAX) 10 mg suppository Unwrap then insert 1 suppository (10 mg total) into the rectum daily as needed. 12 suppository 0    cefdinir (OMNICEF) 300 MG capsule Take 1 capsule (300mg ) twice a day on Mon, Wed, Friday      cholecalciferol, vitamin D3 25 mcg, 1,000 units,, 1,000 unit (25 mcg) tablet Take 1 tablet (25 mcg total) by mouth daily. 100 tablet 3    clobetasoL (TEMOVATE) 0.05 % ointment Apply topically two (2) times a day. To stubborn/thick rashes on hands/feet ears until clear/smooth. Restart as needed 60 g 3    diclofenac sodium (VOLTAREN) 1 % gel Apply 2 g topically four (4) times a day as needed for pain.      doxycycline (VIBRA-TABS) 100 MG tablet Take 1 tablets by mouth twice a day on Tuesday, Thursday, Saturday, Sunday. 28 tablet 0    famotidine (PEPCID) 20 MG tablet Take 1 tablet (20 mg total) by mouth nightly. 90 tablet 3    fluticasone propionate (FLONASE) 50 mcg/actuation nasal spray 1 spray on skin prior to application of butrans patch to reduce irritation and itching 16 g 0    hydrocortisone (ANUSOL-HC) 2.5 % rectal cream Insert into the rectum two (2) times a day as needed.      lidocaine 4 % patch Place 2 patches on the skin daily for 7 days. Apply to affected area for 12 hours, then remove for 12 hours. 15 patch 0    [  EXPIRED] LORazepam (ATIVAN) 0.5 MG tablet Take 1 tablet (0.5 mg total) by mouth Three (3) times a day as needed for anxiety. 20 tablet 0    LORazepam (ATIVAN) 0.5 MG tablet Take 1 tablet (0.5 mg total) by mouth Three (3) times a day as needed for anxiety. 270 tablet 2    mirtazapine (REMERON) 15 MG tablet Take  1 tablet by mouth (15mg ) at night with 7.5mg  tablet to make 22.5mg  nightly 30 tablet 0    mirtazapine (REMERON) 7.5 MG tablet Take 1 tablet by mouth (7.5mg ) with 15mg  tab to make 22.5mg  daily 30 tablet 0    naloxone (NARCAN) 4 mg nasal spray One spray in either nostril once for known/suspected opioid overdose. May repeat every 2-3 minutes in alternating nostril til EMS arrives 2 each 0    nirogacestat (OGSIVEO) 50 mg tablet Take 3 tablets (150 mg total) by mouth two (2) times a day. Swallow tablets whole; do not break, crush, or chew. 180 tablet 0    [EXPIRED] ondansetron (ZOFRAN-ODT) 4 MG disintegrating tablet Dissolve 1 tablet (4 mg total) by mouth every six (6) hours as needed. 20 tablet 0    oxyCODONE (ROXICODONE) 10 mg immediate release tablet Take 1 tablet (10 mg total) by mouth Three (3) times a day as needed for pain. Fill on or after: 09/08/22 90 tablet 0    [EXPIRED] pantoprazole (PROTONIX) 40 MG tablet Take 1 tablet (40 mg total) by mouth daily. 30 tablet 0    pimecrolimus (ELIDEL) 1 % cream Apply topically two (2) times a day as needed. To face as needed for rash      [EXPIRED] promethazine (PHENERGAN) 12.5 MG tablet Take 1 tablet (12.5 mg total) by mouth every six (6) hours as needed. 20 tablet 0    triamcinolone (KENALOG) 0.1 % cream Apply topically two (2) times a day as needed. Apply to rash as needed      zinc oxide 10 % Crea Apply 1 application. topically daily as needed.         Allergies:  Allergies   Allergen Reactions    Adhesive Tape-Silicones Hives and Rash     Paper tape  And tegederm ok    Ferrlecit [Sodium Ferric Gluconat-Sucrose] Swelling and Rash    Levofloxacin Swelling and Rash     Swelling in mouth, rash,     Methylnaltrexone      Per Patient: I lost bowel control, severe abdominal cramping, and elevated BP    Neomycin Swelling     Rxn after ear drops; ear swelling    Papaya Hives    Morphine Nausea And Vomiting    Zosyn [Piperacillin-Tazobactam] Itching and Rash     Red and itchy    Compazine [Prochlorperazine] Other (See Comments)     Extreme agitation    Iron Analogues     Iron Dextran Itching     Received iron dextran 06/08/22 over 12 hours, had itching and redness/flushing during the infusion and for a couple days after. Required IV benadryl w/flares between doses and ultimately treated w/IV methylpred for 2 days.     Latex, Natural Rubber Rash       Review of systems:   Negative except as noted in the History of Present Illness.    Vital Signs:  BP 116/69  - Pulse 104  - Temp 36.9 ??C (98.4 ??F) (Oral)  - Resp 20  - Ht 165.1 cm (5' 5)  - Wt 62.4 kg (137 lb 9.6 oz)  -  SpO2 97%  - BMI 22.90 kg/m??     Physical Exam:  GENERAL: alert, active, in no moderate distress  HEENT: Pupils equal, round, reactive to light and accommodation No scleral icterus, no proptosis, conjunctivae clear.    NECK: Supple, trachea in the midline, no JVD, no thyromegaly.  PULMONARY: CTAB, no rales. Port site on right chest CDI, no erythema or drainage. Bandage over L chest injection site  CARDIOVASCULAR: Regular rate and rhythm, no murmurs, no peripheral edema.  Radial pulses 2+, dorsalis pedis/posterior tibialis pulses 2+  ABDOMEN: soft, diffuse stiffness most notable on RLQ. Previous surgical scars well healed  Rectal exam: deferred  NEURO: CN II-XII grossly intact.   SKIN: Skin color, texture, turgor normal. No rashes or lesions.  PSYCH: oriented to time, place and person, mood and affect are within normal limits      Labs  Lab Results   Component Value Date    WBC 6.3 09/26/2022    HGB 8.5 (L) 09/26/2022    HCT 25.8 (L) 09/26/2022    PLT 196 09/26/2022       Lab Results   Component Value Date    NA 139 09/26/2022    K 3.6 09/26/2022    CL 110 (H) 09/26/2022    CO2 21.0 09/26/2022    BUN <5 (L) 09/26/2022    CREATININE 0.42 (L) 09/26/2022    GLU 86 09/26/2022    CALCIUM 7.8 (L) 09/26/2022    MG 2.1 08/20/2022    PHOS 4.7 08/15/2022       Lab Results   Component Value Date    BILITOT <0.2 (L) 09/26/2022    BILIDIR <0.10 08/08/2022    PROT 4.5 (L) 09/26/2022    ALBUMIN 2.4 (L) 09/26/2022    ALT 16 09/26/2022    AST 14 09/26/2022    ALKPHOS 56 09/26/2022    GGT 13 10/31/2011       Lab Results   Component Value Date    PT 11.8 07/21/2022    INR 1.06 07/21/2022    APTT 57.7 (H) 05/02/2022         Imaging  CT Abdomen Pelvis W Contrast    Result Date: 09/26/2022  EXAM: CT ABDOMEN PELVIS W CONTRAST ACCESSION: [id number] UN CLINICAL INDICATION: 22 years old with peritonitis, lactate 8.3      COMPARISON: CT chest/abdomen/pelvis     TECHNIQUE: A helical CT scan of the abdomen and pelvis was obtained following IV contrast from the lung bases through the pubic symphysis. Images were reconstructed in the axial plane. Coronal and sagittal reformatted images were also provided for further evaluation.         FINDINGS:     LOWER CHEST: Please see dedicated CT chest for characterization of findings above the diaphragm.     LIVER: Normal liver contour.  Subcentimeter hypoattenuating lesions, too small to characterize.     BILIARY: The gallbladder is normal in appearance. Mild prominence of the common bile duct, measuring up to 7mm, similar to prior.     SPLEEN: Normal in size and contour.     PANCREAS: Normal pancreatic contour.  No focal lesions.  No ductal dilation.     ADRENAL GLANDS: Normal appearance of the adrenal glands.     KIDNEYS/URETERS: Symmetric renal enhancement.  No hydronephrosis.  SImilar cyst in the left kidney.     BLADDER: Mild circumferential bladder wall thickening, likely accentuated by underdistention and similar to prior. Heterogeneous hyperattenuation within the bladder, likely excreted IV contrast.  REPRODUCTIVE ORGANS: Uterus is present. No suspicious adnexal lesion.     GI TRACT: Sequela of proctocolectomy with ileoanal anastomosis. Multiple dilated air and fluid-containing small bowel loops in the abdomen without a discrete transition point. Similar fluid-density contents in the neorectum.  The appendix is surgically absent.     PERITONEUM, RETROPERITONEUM AND MESENTERY: No free air.  Trace perihepatic free fluid  along the inferior margin of the right hepatic lobe (2:67, 4:32)).  No loculated fluid collection. Similar appearance of ill-defined lesion in the lower abdominal mesentery with surrounding inflammatory fat stranding (2:90).     LYMPH NODES: No adenopathy.     VESSELS: Hepatic and portal veins are patent.  Normal caliber aorta.      BONES and SOFT TISSUES: No aggressive osseous lesions. SImilar 2.6 cm right inferior rectus intramuscular mass (2:128). Similar stranding adjacent to the left femoral vessels. Similar bilateral gluteal subcutaneous nodules.             Trace free fluid in the right upper quadrant adjacent to the liver, of uncertain etiology.      Multiple dilated air and fluid-containing bowel loops without a discrete transition point, question ileus.     Similar right inferior rectus mass and lower abdominal mesenteric soft tissue with surrounding stranding.                     CTA Chest W Contrast    Result Date: 09/26/2022  EXAM: CTA CHEST W CONTRAST ACCESSION: [id number] UN     CLINICAL INDICATION: concern for PE     TECHNIQUE: Contiguous axial images were reconstructed through the chest following a single breath hold helical acquisition during the administration of intravenous contrast material. Images were reformatted in the coronal and sagittal planes. MIP slabs were also constructed.     COMPARISON: 07/16/2021 CTA Chest with contrast.     FINDINGS:     PULMONARY ARTERIES: No emboli in either lung. Main pulmonary artery is normal in size.     HEART AND VASCULATURE: Cardiac chambers are normal in size. No pericardial effusion. Aorta is normal in caliber.     LUNGS, AIRWAYS, AND PLEURA: Lungs are clear. Central airways are patent. No pleural effusion or pneumothorax.     MEDIASTINUM AND LYMPH NODES: No enlarged intrathoracic, axillary, or supraclavicular lymph nodes. No mediastinal mass or other abnormality.     CHEST WALL AND BONES: Unremarkable.     UPPER ABDOMEN: Unremarkable.     OTHER: No other findings.             No emboli and no acute findings.    XR Abdomen 1 View    Result Date: 09/26/2022  EXAM: XR ABDOMEN 1 VIEW ACCESSION: [id number] UN     CLINICAL INDICATION: 22 years old with ABDOMINAL PAIN  -  UNSPECIFIED SITE      COMPARISON: CT chest abdomen pelvis 09/16/2022     TECHNIQUE: Supine views of the abdomen, 2 images     FINDINGS:     Diffuse gaseous distention of multiple loops of small and large bowel with borderline small bowel dilatation measuring up to 3.1 cm. The lung bases are clear. No acute osseous abnormalities. Suture material throughout the lower abdominal quadrants and pelvis.         Diffuse gaseous distention of multiple loops of small and large bowel, suggestive of ileus.    XR Chest Portable    Result Date: 09/26/2022  EXAM: XR CHEST PORTABLE ACCESSION: [id number] Bethann Humble  CLINICAL INDICATION: CHEST PAIN      TECHNIQUE: Single View AP Chest Radiograph.     COMPARISON: 07/31/2022     FINDINGS:     Right IJ central venous catheter with tip in superior right atrium.     Lungs are clear.  No pleural effusion or pneumothorax.     Normal heart size and mediastinal contours.             No acute abnormalities.    ECG 12 Lead    Result Date: 09/26/2022  NORMAL SINUS RHYTHM NORMAL ECG WHEN COMPARED WITH ECG OF 14-Aug-2022 12:03, NO SIGNIFICANT CHANGE WAS FOUND    IR Inj Intralesional 1-7 Lesions    Result Date: 09/26/2022  EXAM: IR INJ INTRALESIONAL 1-7 LESIONS DATE: 09/25/2022 1:57 PM ACCESSION: [id number] UN DICTATED: 09/25/2022 3:38 PM INTERPRETATION LOCATION: [facility]     PROCEDURE: Ultrasound-guided intralesional injection     Pre-procedure diagnosis: [name], desmoid tumors Post-procedure diagnosis: Same Indication: Reduction of pain/symptoms Additional clinical history: None _______________________________________________________________     PROCEDURE SUMMARY: - Percutaneous US-guided intralesional injection of desmoid tumors - Additional procedure(s): None     PROCEDURE DETAILS:     Procedure personnel: Attending physician(s): Ammie Dalton Fellow physician(s): Renato Battles Resident physician(s): None Advanced practice provider(s): None     Pre-procedure Consent: Informed consent for the procedure including risks, benefits and alternatives was obtained and time-out was performed prior to the procedure. Preparation: The site was prepared and draped using maximal sterile barrier technique including cutaneous antisepsis.     Anesthesia/sedation Level of anesthesia/sedation: General anesthesia Anesthesia/sedation administered by: Anesthesiology Total intra-service sedation time (minutes): I personally spent 0 minutes, continuously monitoring the patient face-to-face during the administration of moderate sedation. Radiology nurse was present for the duration of the procedure to assist in patient monitoring. Pre and Post Sedation activities have been reviewed.     Imaging prior to intralesional injection The patient was positioned supine. Initial ultrasound of the right dorsolateral forearm and soft tissues of the left anterior upper chest was performed. Biopsy targets: - Locations: 1. Right dorsoateral forearm, proximal 2. Right dorsolateral forearm, distal 3. Left upper anterior chest wall near the coracoid process Other findings: None     Injection Equal parts triamcinolone acetonide (200 mg per 5 mL) and 0.5% bupivacaine (5 mg per mL) were mixed together on the backtable.     Lesion 1: Local anesthesia was administered. Under US guidance, the 22-G injection needle was advanced to the target and intralesional injection of 0.2 mL triamcinolone/bupivacaine mixture (4mg  triamcinolone and 0.1mg  bupivacaine) was performed over 0 minutes.     Lesion 2: Local anesthesia was administered. Under US guidance, the 22-G injection needle was advanced to the target and intralesional injection of 0.1 mL triamcinolone/bupivacaine mixture (2mg  triamcinolone and 0.05mg  bupivacaine) was performed over 0 minutes.     Lesion 3: Local anesthesia was administered. Under US guidance, the 22-G injection needle was advanced to the target and intralesional injection of 0.1 mL triamcinolone/bupivacaine mixture (2mg  triamcinolone and 0.05mg  bupivacaine) was performed over 0 minutes.     Needle removal At each site, the injection needle was removed and a sterile dressing was applied. Tract embolization: None     Imaging following biopsy Immediate post-injection ultrasound was not performed. Post-injection imaging findings: Not applicable     Additional Details Additional description of procedure: None Equipment details: None Specimens removed: None Estimated blood loss (mL): less than 10 mL     Attestation Dr. Cathie Hoops was present for and  supervised the entire procedure.     Complications: No immediate complications.             INCOMPLETE     Ultrasound-guided intralesional injection of a total of 0.4 mL of a mixture of triamcinolone (8mg ) and bupivacaine (0.2mg ) in 2 separate right forearm desmoid tumors and a left anterior upper chest wall desmoid tumor, fully detailed below.

## 2022-09-27 LAB — LACTATE, VENOUS, WHOLE BLOOD: LACTATE BLOOD VENOUS: 0.7 mmol/L (ref 0.5–1.8)

## 2022-09-27 LAB — CBC W/ AUTO DIFF
BASOPHILS ABSOLUTE COUNT: 0 10*9/L (ref 0.0–0.1)
BASOPHILS RELATIVE PERCENT: 0.6 %
EOSINOPHILS ABSOLUTE COUNT: 0.3 10*9/L (ref 0.0–0.5)
EOSINOPHILS RELATIVE PERCENT: 5.9 %
HEMATOCRIT: 30.8 % — ABNORMAL LOW (ref 34.0–44.0)
HEMOGLOBIN: 10.1 g/dL — ABNORMAL LOW (ref 11.3–14.9)
LYMPHOCYTES ABSOLUTE COUNT: 2.1 10*9/L (ref 1.1–3.6)
LYMPHOCYTES RELATIVE PERCENT: 37.6 %
MEAN CORPUSCULAR HEMOGLOBIN CONC: 32.6 g/dL (ref 32.0–36.0)
MEAN CORPUSCULAR HEMOGLOBIN: 26.5 pg (ref 25.9–32.4)
MEAN CORPUSCULAR VOLUME: 81.2 fL (ref 77.6–95.7)
MEAN PLATELET VOLUME: 9.3 fL (ref 6.8–10.7)
MONOCYTES ABSOLUTE COUNT: 0.6 10*9/L (ref 0.3–0.8)
MONOCYTES RELATIVE PERCENT: 10.2 %
NEUTROPHILS ABSOLUTE COUNT: 2.5 10*9/L (ref 1.8–7.8)
NEUTROPHILS RELATIVE PERCENT: 45.7 %
PLATELET COUNT: 230 10*9/L (ref 150–450)
RED BLOOD CELL COUNT: 3.8 10*12/L — ABNORMAL LOW (ref 3.95–5.13)
RED CELL DISTRIBUTION WIDTH: 12.6 % (ref 12.2–15.2)
WBC ADJUSTED: 5.5 10*9/L (ref 3.6–11.2)

## 2022-09-27 LAB — BASIC METABOLIC PANEL
ANION GAP: 4 mmol/L — ABNORMAL LOW (ref 5–14)
BLOOD UREA NITROGEN: <5 mg/dL — ABNORMAL LOW (ref 9–23)
CALCIUM: 8.7 mg/dL (ref 8.7–10.4)
CHLORIDE: 111 mmol/L — ABNORMAL HIGH (ref 98–107)
CO2: 26 mmol/L (ref 20.0–31.0)
CREATININE: 0.6 mg/dL
EGFR CKD-EPI (2021) FEMALE: >90 mL/min/{1.73_m2} (ref >=60)
GLUCOSE RANDOM: 89 mg/dL (ref 70–99)
POTASSIUM: 3.5 mmol/L (ref 3.4–4.8)
SODIUM: 141 mmol/L (ref 135–145)

## 2022-09-27 MED ADMIN — phenol (CHLORASEPTIC) 1.4 % spray 2 spray: 2 | @ 14:00:00 | NDC 96295013193

## 2022-09-27 MED ADMIN — promethazine (PHENERGAN) injection 6.25 mg: 6.25 mg | INTRAVENOUS | @ 18:00:00 | NDC 60977000143

## 2022-09-27 MED ADMIN — mirtazapine (REMERON) tablet 15 mg: 15 mg | ORAL | @ 03:00:00 | NDC 78206016001

## 2022-09-27 MED ADMIN — iohexol (OMNIPAQUE) 240 mg iodine/mL oral solution 50 mL: 50 mL | ORAL | Stop: 2022-09-27 | NDC 00407141284

## 2022-09-27 MED ADMIN — buprenorphine-naloxone (SUBOXONE) 2-0.5 mg SL film 0.5 mg of buprenorphine: .25 | SUBLINGUAL | @ 12:00:00 | NDC 52427069211

## 2022-09-27 MED ADMIN — cefepime (MAXIPIME) 2 g in sodium chloride 0.9 % (NS) 100 mL IVPB-MBP: 2 g | INTRAVENOUS | @ 04:00:00 | Stop: 2022-10-03 | NDC 71288000921

## 2022-09-27 MED ADMIN — phenol (CHLORASEPTIC) 1.4 % spray 2 spray: 2 | @ 16:00:00 | NDC 96295013193

## 2022-09-27 MED ADMIN — famotidine (PEPCID) tablet 20 mg: 20 mg | ORAL | NDC 96619043921

## 2022-09-27 MED ADMIN — HYDROmorphone (PF) injection Syrg 0.5 mg: .5 mg | INTRAVENOUS | @ 09:00:00 | Stop: 2022-09-27 | NDC 00409426411

## 2022-09-27 MED ADMIN — ondansetron (ZOFRAN) injection 4 mg: 4 mg | INTRAVENOUS | @ 09:00:00 | NDC 71930001752

## 2022-09-27 MED ADMIN — cefTRIAXone (ROCEPHIN) 2 g in sodium chloride 0.9 % (NS) 100 mL IVPB-MBP: 2 g | INTRAVENOUS | @ 12:00:00 | Stop: 2022-10-04 | NDC 68330000601

## 2022-09-27 MED ADMIN — buprenorphine-naloxone (SUBOXONE) 2-0.5 mg SL film 0.5 mg of buprenorphine: .25 | SUBLINGUAL | @ 22:00:00 | NDC 52427069211

## 2022-09-27 MED ADMIN — cefepime (MAXIPIME) 2 g in sodium chloride 0.9 % (NS) 100 mL IVPB-MBP: 2 g | INTRAVENOUS | @ 09:00:00 | Stop: 2022-09-27 | NDC 71288000921

## 2022-09-27 MED ADMIN — promethazine (PHENERGAN) injection 6.25 mg: 6.25 mg | INTRAVENOUS | @ 01:00:00 | Stop: 2022-09-26 | NDC 60977000143

## 2022-09-27 MED ADMIN — ondansetron (ZOFRAN) injection 4 mg: 4 mg | INTRAVENOUS | @ 23:00:00 | NDC 71930001752

## 2022-09-27 MED ADMIN — acetaminophen (TYLENOL) tablet 1,000 mg: 1000 mg | ORAL | @ 03:00:00 | NDC 50580048790

## 2022-09-27 MED ADMIN — oxyCODONE (ROXICODONE) immediate release tablet 15 mg: 15 mg | ORAL | @ 18:00:00 | Stop: 2022-10-09 | NDC 72865012805

## 2022-09-27 MED ADMIN — HYDROmorphone (PF) injection Syrg 0.5 mg: .5 mg | INTRAVENOUS | @ 17:00:00 | Stop: 2022-09-28 | NDC 00409426411

## 2022-09-27 MED ADMIN — gadopiclenol injection 6.24 mL: .1 mL/kg | INTRAVENOUS | @ 03:00:00 | Stop: 2022-09-26 | NDC 67684423002

## 2022-09-27 MED ADMIN — acetaminophen (OFIRMEV) 10 mg/mL injection 500 mg 50 mL: 500 mg | INTRAVENOUS | @ 18:00:00 | Stop: 2022-09-28 | NDC 70004000122

## 2022-09-27 MED ADMIN — linezolid in dextrose 5% (ZYVOX) 600 mg/300 mL IVPB 600 mg: 600 mg | INTRAVENOUS | Stop: 2022-10-03 | NDC 72606000111

## 2022-09-27 MED ADMIN — HYDROmorphone (PF) injection Syrg 0.5 mg: .5 mg | INTRAVENOUS | @ 04:00:00 | Stop: 2022-09-26 | NDC 00409426411

## 2022-09-27 MED ADMIN — lactated ringers bolus 500 mL: 500 mL | INTRAVENOUS | Stop: 2022-09-26 | NDC 73913127180

## 2022-09-27 MED ADMIN — buprenorphine-naloxone (SUBOXONE) 2-0.5 mg SL film 0.5 mg of buprenorphine: .25 | SUBLINGUAL | @ 16:00:00 | NDC 52427069211

## 2022-09-27 MED ADMIN — HYDROmorphone (PF) injection Syrg 0.5 mg: .5 mg | INTRAVENOUS | @ 01:00:00 | Stop: 2022-09-26 | NDC 00409426411

## 2022-09-27 MED ADMIN — baclofen (LIORESAL) tablet 10 mg: 10 mg | ORAL | @ 21:00:00 | NDC 73320000204

## 2022-09-27 MED ADMIN — dextrose 5 % in lactated ringers infusion: 100 mL/h | INTRAVENOUS | @ 14:00:00 | NDC 82468011674

## 2022-09-27 MED ADMIN — ondansetron (ZOFRAN) injection 4 mg: 4 mg | INTRAVENOUS | @ 04:00:00 | NDC 71930001752

## 2022-09-27 MED ADMIN — HYDROmorphone (PF) injection Syrg 0.5 mg: .5 mg | INTRAVENOUS | @ 23:00:00 | Stop: 2022-09-28 | NDC 00409426411

## 2022-09-27 MED ADMIN — ondansetron (ZOFRAN) injection 4 mg: 4 mg | INTRAVENOUS | @ 15:00:00 | NDC 71930001752

## 2022-09-27 MED ADMIN — HYDROmorphone (PF) injection Syrg 0.5 mg: .5 mg | INTRAVENOUS | @ 14:00:00 | Stop: 2022-09-27 | NDC 00409426411

## 2022-09-27 MED ADMIN — HYDROmorphone (PF) injection Syrg 0.5 mg: .5 mg | INTRAVENOUS | @ 12:00:00 | Stop: 2022-09-27 | NDC 00409426411

## 2022-09-27 MED ADMIN — HYDROmorphone (PF) injection Syrg 0.5 mg: .5 mg | INTRAVENOUS | @ 20:00:00 | Stop: 2022-09-28 | NDC 00409426411

## 2022-09-27 MED ADMIN — baclofen (LIORESAL) tablet 10 mg: 10 mg | ORAL | @ 05:00:00 | NDC 73320000204

## 2022-09-27 MED ADMIN — oxyCODONE (ROXICODONE) immediate release tablet 15 mg: 15 mg | ORAL | @ 03:00:00 | Stop: 2022-10-09 | NDC 72865012805

## 2022-09-27 MED ADMIN — lidocaine 4 % patch 2 patch: 2 | TRANSDERMAL | @ 12:00:00 | NDC 63323047930

## 2022-09-27 MED ADMIN — phenol (CHLORASEPTIC) 1.4 % spray 2 spray: 2 | @ 01:00:00 | NDC 96295013193

## 2022-09-27 NOTE — Unmapped (Signed)
Shift Events: Pt received after the rapid response in the 7BT. Pt A/Ox4, On RA, C/o SOB after coming back from CTscan, for comfort placed on 2l [place]. PRN ativan  for anxiety. Pt NPO d/t c/o nausea. Pt c/o abdominal pain, tender, PRN oxycodone and IV dilaudid given. STAT blood culture send, per provider order 1 set drawn from peripheral and 1 set from the central line. Stat CT scan done, see result. NGT tube placed, pt tolerated. Pending Xray to confirmed.3 BM. Adequate urine output. Ambulated upto the toilet. See flowsheets/MAR for more info. Lactated trending down, q4H monitoring. Pt pending MRI scan.  Mom at the bedside.        Current Hospital Length of Stay: 0 Days    INFUSIONS:      VITALS:  Vitals:    09/25/22 2342   Weight: 62.4 kg (137 lb 9.6 oz)      Weight change:      Temp:  [36.6 ??C (97.9 ??F)-36.9 ??C (98.4 ??F)] 36.7 ??C (98 ??F)  Heart Rate:  [72-108] 81  SpO2 Pulse:  [79-104] 79  Resp:  [18-24] 24  BP: (104-147)/(55-99) 114/55  MAP (mmHg):  [68-112] 68  SpO2:  [95 %-99 %] 97 %  BMI (Calculated):  [22.9] 22.9     VENT/OXYGEN SETTINGS:       INS & OUTS:  Date 09/26/22 0701 - 09/27/22 0700   Shift 0701-1900 1901-0700 24 Hour Total   INTAKE   P.O. 120  120   IV Piggyback 2300  2300   Shift Total 2420  2420   OUTPUT   Shift Total        Intake/Output Summary (Last 24 hours) at 09/26/2022 1954  Last data filed at 09/26/2022 1745  Gross per 24 hour   Intake 2420 ml   Output --   Net 2420 ml        ACCESS:  Patient Lines/Drains/Airways Status       Active Active Lines, Drains, & Airways       Name Placement date Placement time Site Days    Non-Surgical Airway Nasal Cannula 08/05/22  0810  --  52    CVC Double Lumen 01/10/22 Tunneled Right Internal jugular 01/10/22  1353  Internal jugular  259                   Problem: Adult Inpatient Plan of Care  Goal: Plan of Care Review  Outcome: Ongoing - Unchanged  Goal: Patient-Specific Goal (Individualized)  Outcome: Ongoing - Unchanged  Goal: Absence of Hospital-Acquired Illness or Injury  Outcome: Ongoing - Unchanged  Intervention: Identify and Manage Fall Risk  Recent Flowsheet Documentation  Taken 09/26/2022 1200 by Lilyan Gilford, RN  Safety Interventions:   bed alarm   bleeding precautions   commode/urinal/bedpan at bedside   lighting adjusted for tasks/safety   low bed   nonskid shoes/slippers when out of bed   room near unit station  Intervention: Prevent and Manage VTE (Venous Thromboembolism) Risk  Recent Flowsheet Documentation  Taken 09/26/2022 1107 by Lilyan Gilford, RN  VTE Prevention/Management:   ambulation promoted   fluids promoted   intravenous hydration  Intervention: Prevent Infection  Recent Flowsheet Documentation  Taken 09/26/2022 1200 by Lilyan Gilford, RN  Infection Prevention:   cohorting utilized   equipment surfaces disinfected   hand hygiene promoted   personal protective equipment utilized   rest/sleep promoted   single patient room provided  Goal: Optimal Comfort and Wellbeing  Outcome: Ongoing - Unchanged  Goal: Readiness for Transition of Care  Outcome: Ongoing - Unchanged  Goal: Rounds/Family Conference  Outcome: Ongoing - Unchanged     Problem: Latex Allergy  Goal: Absence of Allergy Symptoms  Outcome: Ongoing - Unchanged         Lilyan Gilford, RN  September 26, 2022 7:54 PM

## 2022-09-27 NOTE — Unmapped (Signed)
Alert and oriented throughout shift with periods of drowsiness contributed to IV pain medication. 2L [place] sats 96-100%. Pt requesting to keep [place] on. Complaints of 10/10 pain in abdomen, back, arm, and throat throughout shift. See MAR for meds given. NG@60 . To LIWS. Mother @ bedside all night.

## 2022-09-27 NOTE — Unmapped (Signed)
PHYSICAL THERAPY  Evaluation (09/27/22 0848)          Patient Name:  [name] [name]       Medical Record Number: [id number]   Date of Birth: 2000/11/17  Sex: Female        Post-Discharge Physical Therapy Recommendations:  PT Post Acute Discharge Recommendations: Skilled PT services indicated, 5x weekly, High intensity   Equipment Recommendation  PT DME Recommendations: Defer to post acute          Treatment Diagnosis: Difficulty in walking, Generalized muscle weakness  Treatment Diagnosis: Impaired mobility     Activity Tolerance: Tolerated treatment well     ASSESSMENT  Problem List: Decreased endurance, Decreased mobility, Decreased strength, Pain, Impaired balance      Assessment :   Lavell Avey is a 22 y.o. female that presented to [facility] with Other acute postprocedural pain.  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     Pt/mother report that at pt's baseline, she is able to ambulate with no device, and took a trip to the beach a few weeks ago, although does require activity modification due to fatigue and some weakness. Today pt presents with muscle rigidity in bilateral LEs, global pain, and gait impairments. Pt with worsening rigidity with continued activity. Activity limited to ambulation of 5 ft, 8 ft, and 8 ft with CGA to Min A and RW. Pt presents with new functional limitations and an activity tolerance below pt's baseline, and would benefit from 5x(high) PT post-discharge to maximize safety and functional mobility. After a review of the personal factors, comorbidities, clinical presentation, and examination of the number of affected body systems, the patient presents as a moderate complexity case.      Today's Interventions: Gait training, Therapeutic activity  Today's Interventions: Evaluation, static/dynamic sitting tolerance including LAQ at EOB and lateral scooting, gait training with RW with VC for negotiating RW to stay within parameters of device. Educated pt/mother regarding PT role and POC, strategies for safe transfers/ambulation to/from the commode for toileting needs.                          PLAN  Planned Frequency of Treatment: Plan of Care Initiated: 09/27/22  1-2x per day for: 3-4x week  Planned Treatment Duration: 10/11/22     Planned Interventions: Gait training, Home exercise program, Therapeutic Activity, Therapeutic Exercise, Self-care / Home Management training, Education (Patient/Family/Caregiver)     Goals:   Patient and Family Goals: To have her muscles be less rigid, and have walking be easier     SHORT GOAL #1: Pt will perform sit<>stand mod I with LRAD               Time Frame : 2 weeks  SHORT GOAL #2: Pt will ambulate 60 ft mod I with LRAD              Time Frame : 2 weeks  SHORT GOAL #3: Pt will ascend/descend 2 stairs with Min A and no rails              Time Frame : 2 weeks                                           Long Term Goal #1: Pt will negotiate 14  stairs with unilateral railing and SBA  Time Frame: 3 months     Prognosis:  Good  Positive Indicators: Participation, family support  Barriers to Discharge: Gait instability, Impaired Balance, Endurance deficits, Inaccessible home environment, Pain     SUBJECTIVE  Communication Preference: Verbal, Visual,     Patient reports: Pt agreeable to PT        Prior Functional Status: Pt reports following her last hospitalization she was able to go to the beach briefly and ambulate on the sand. Was also able to play mini-golf but unable to bend down to grab a ball. Since her egg retrieval and the initiation of her new chemo medication, she has been spending most of her time in bed. No falls in the past month, but did have a few falls last hospitalization due to syncope.  Equipment available at home: [employer]er, fashion/clothing)        Past Medical History:   Diagnosis Date    Abdominal pain     Acid reflux     occas    Anesthesia complication     itching, shaking, coldness; last few surgeries have gone much better    Cancer (CMS-HCC)     Cataract of right eye     COVID-19 virus infection 01/2019    Cyst of thyroid determined by ultrasound     monitoring    [name]     2 right forearm, 1 left thigh, 1 right scapula, 1 under left clavicle; multiple    Difficult intravenous access     [name] (familial adenomatous polyposis)     [name]     Gastric polyps     History of chemotherapy     last treatment approx 05/2019    History of colon polyps     History of COVID-19 01/2019    Iron deficiency anemia due to chronic blood loss     received iron infusion 11-2019    PONV (postoperative nausea and vomiting)     Rectal bleeding     Syncopal episodes     especially if becoming dehydrated            Social History     Tobacco Use    Smoking status: Never     Passive exposure: Past    Smokeless tobacco: Never   Substance Use Topics    Alcohol use: Never       Past Surgical History:   Procedure Laterality Date    COLON SURGERY      cyroablation      cystis removal      desmoid removal      PR CLOSE ENTEROSTOMY,RESEC+ANAST N/A 10/09/2020    Procedure: ILEOSTOMY TAKEDOWN;  Surgeon: Mickle Asper, MD;  Location: OR [place];  Service: General Surgery    PR COLONOSCOPY W/BIOPSY SINGLE/MULTIPLE N/A 10/27/2012    Procedure: COLONOSCOPY, FLEXIBLE, PROXIMAL TO SPLENIC FLEXURE; WITH BIOPSY, SINGLE OR MULTIPLE;  Surgeon: Shirlyn Goltz Mir, MD;  Location: PEDS PROCEDURE ROOM [place];  Service: Gastroenterology    PR COLONOSCOPY W/BIOPSY SINGLE/MULTIPLE N/A 09/14/2013    Procedure: COLONOSCOPY, FLEXIBLE, PROXIMAL TO SPLENIC FLEXURE; WITH BIOPSY, SINGLE OR MULTIPLE;  Surgeon: Shirlyn Goltz Mir, MD;  Location: PEDS PROCEDURE ROOM ;  Service: Gastroenterology    PR COLONOSCOPY W/BIOPSY SINGLE/MULTIPLE N/A 11/08/2014    Procedure: COLONOSCOPY, FLEXIBLE, PROXIMAL TO SPLENIC FLEXURE; WITH BIOPSY, SINGLE OR MULTIPLE;  Surgeon: Arnold Long Mir, MD;  Location: PEDS PROCEDURE ROOM [facility];  Service: Gastroenterology    PR COLONOSCOPY  W/BIOPSY SINGLE/MULTIPLE N/A 12/26/2015    Procedure: COLONOSCOPY, FLEXIBLE, PROXIMAL TO SPLENIC FLEXURE; WITH BIOPSY, SINGLE OR MULTIPLE;  Surgeon: Arnold Long Mir, MD;  Location: PEDS PROCEDURE ROOM [facility];  Service: Gastroenterology    PR COLONOSCOPY W/BIOPSY SINGLE/MULTIPLE N/A 09/02/2017    Procedure: COLONOSCOPY, FLEXIBLE, PROXIMAL TO SPLENIC FLEXURE; WITH BIOPSY, SINGLE OR MULTIPLE;  Surgeon: Arnold Long Mir, MD;  Location: PEDS PROCEDURE ROOM [place];  Service: Gastroenterology    PR COLSC FLX W/REMOVAL LESION BY HOT BX FORCEPS N/A 08/27/2016    Procedure: COLONOSCOPY, FLEXIBLE, PROXIMAL TO SPLENIC FLEXURE; W/REMOVAL TUMOR/POLYP/OTHER LESION, HOT BX FORCEP/CAUTE;  Surgeon: Arnold Long Mir, MD;  Location: PEDS PROCEDURE ROOM [facility];  Service: Gastroenterology    PR COLSC FLX W/RMVL OF TUMOR POLYP LESION SNARE TQ N/A 02/25/2019    Procedure: COLONOSCOPY FLEX; W/REMOV TUMOR/LES BY SNARE;  Surgeon: Helyn Numbers, MD;  Location: [facility] PROCEDURES [facility] [facility];  Service: Gastroenterology    PR COLSC FLX W/RMVL OF TUMOR POLYP LESION SNARE TQ N/A 03/13/2020    Procedure: COLONOSCOPY FLEX; W/REMOV TUMOR/LES BY SNARE;  Surgeon: Helyn Numbers, MD;  Location: [facility] [facility];  Service: Gastroenterology    PR EXC SKIN BENIG 2.1-3 CM TRUNK,ARM,LEG Right 02/25/2017    Procedure: EXCISION, BENIGN LESION INCLUDE MARGINS, EXCEPT SKIN TAG, LEGS; EXCISED DIAMETER 2.1 TO 3.0 CM;  Surgeon: Clarene Duke, MD;  Location: CHILDRENS OR [facility];  Service: Plastics    PR EXC SKIN BENIG 3.1-4 CM TRUNK,ARM,LEG Right 02/25/2017    Procedure: EXCISION, BENIGN LESION INCLUDE MARGINS, EXCEPT SKIN TAG, ARMS; EXCISED DIAMETER 3.1 TO 4.0 CM;  Surgeon: Clarene Duke, MD;  Location: CHILDRENS OR [facility];  Service: Plastics    PR EXC SKIN BENIG >4 CM FACE,FACIAL Right 02/25/2017    Procedure: EXCISION, OTHER BENIGN LES INCLUD MARGIN, FACE/EARS/EYELIDS/NOSE/LIPS/MUCOUS MEMBRANE; EXCISED DIAM >4.0 CM;  Surgeon: Clarene Duke, MD;  Location: CHILDRENS OR [facility];  Service: Plastics    PR EXC TUMOR SOFT TISSUE LEG/ANKLE SUBQ 3+CM Right 08/05/2019    Procedure: EXCISION, TUMOR, SOFT TISSUE OF LEG OR ANKLE AREA, SUBCUTANEOUS; 3 CM OR GREATER;  Surgeon: Arsenio Katz, MD;  Location: MAIN OR [place];  Service: Plastics    PR EXC TUMOR SOFT TISSUE LEG/ANKLE SUBQ <3CM Right 08/05/2019    Procedure: EXCISION, TUMOR, SOFT TISSUE OF LEG OR ANKLE AREA, SUBCUTANEOUS; LESS THAN 3 CM;  Surgeon: Arsenio Katz, MD;  Location: MAIN OR [facility];  Service: Plastics    PR LAP, SURG PROCTECTOMY W J-POUCH N/A 08/10/2020    Procedure: ROBOTIC ASSISTED LAPAROSCOPIC PROCTOCOLECTOMY, ILEAL J POUCH, WITH OSTOMY;  Surgeon: Mickle Asper, MD;  Location: OR [place];  Service: General Surgery    PR NDSC EVAL INTSTINAL POUCH DX W/COLLJ SPEC SPX N/A 01/23/2021    Procedure: ENDO EVAL SM INTEST POUCH; DX;  Surgeon: Modena Nunnery, MD;  [facility]: [facility] PROCEDURES [facility] [facility];  Service: Gastroenterology    PR NDSC EVAL INTSTINAL POUCH DX W/COLLJ SPEC SPX N/A 08/27/2021    Procedure: ENDO EVAL SM INTEST POUCH; DX;  Surgeon: Hunt Oris, MD;  [facility]: [facility];  Service: Gastroenterology    PR NDSC EVAL INTSTINAL POUCH DX W/COLLJ SPEC SPX N/A 12/09/2021    Procedure: ENDO EVAL SM INTEST POUCH; DX;  Surgeon: Vidal Schwalbe, MD;  Location: GI PROCEDURES [facility];  Service: Gastroenterology    PR NDSC EVAL INTSTINAL POUCH DX W/COLLJ SPEC SPX Left 04/09/2022    Procedure: ENDO EVAL SM INTEST POUCH; DX;  Surgeon: Modena Nunnery, MD;  Location: [facility] [place] [facility];  Service: Gastroenterology    PR NDSC EVAL INTSTINAL POUCH DX W/COLLJ SPEC SPX N/A 08/05/2022    Procedure: ENDO EVAL SM INTEST POUCH; DX;  Surgeon: Modena Nunnery, MD;  Location: GI PROCEDURES [facility];  Service: Gastroenterology    PR NDSC EVAL INTSTINAL POUCH W/BX SINGLE/MULTIPLE N/A 01/20/2022    Procedure: ENDOSCOPIC EVAL OF SMALL INTESTINAL POUCH; DIAGNOSTIC, No biopsies;  Surgeon: Andrey Farmer, MD;  Location: GI PROCEDURES [facility];  Service: Gastroenterology    PR NDSC EVAL INTSTINAL POUCH W/BX SINGLE/MULTIPLE N/A 02/13/2022    Procedure: ENDOSCOPIC EVAL OF SMALL INTESTINAL POUCH; DIAGNOSTIC, WITH BIOPSY;  Surgeon: Bronson Curb, MD;  Location: GI PROCEDURES [facility];  Service: Gastroenterology    PR UNLISTED PROCEDURE SMALL INTESTINE  01/23/2021    Procedure: UNLISTED PROCEDURE, SMALL INTESTINE;  Surgeon: Modena Nunnery, MD;  Location: [facility] PROC[facility]URES [facility] [facility];  Service: Gastroenterology    PR UNLISTED PROCEDURE SMALL INTESTINE  02/13/2022    Procedure: UNLISTED PROCEDURE, SMALL INTESTINE;  Surgeon: Bronson Curb, MD;  Location: GI PROCEDURES [facility];  Service: Gastroenterology    PR UPPER GI ENDOSCOPY,BIOPSY N/A 10/27/2012    Procedure: UGI ENDOSCOPY; WITH BIOPSY, SINGLE OR MULTIPLE;  Surgeon: Shirlyn Goltz Mir, MD;  Location: PEDS PROCEDURE ROOM [facility];  Service: Gastroenterology    PR UPPER GI ENDOSCOPY,BIOPSY N/A 09/14/2013    Procedure: UGI ENDOSCOPY; WITH BIOPSY, SINGLE OR MULTIPLE;  Surgeon: Shirlyn Goltz Mir, MD;  Location: PEDS PROCEDURE ROOM [facility];  Service: Gastroenterology    PR UPPER GI ENDOSCOPY,BIOPSY N/A 11/08/2014    Procedure: UGI ENDOSCOPY; WITH BIOPSY, SINGLE OR MULTIPLE;  Surgeon: Arnold Long Mir, MD;  Location: PEDS PROCEDURE ROOM [facility];  Service: Gastroenterology    PR UPPER GI ENDOSCOPY,BIOPSY N/A 12/26/2015    Procedure: UGI ENDOSCOPY; WITH BIOPSY, SINGLE OR MULTIPLE;  Surgeon: Arnold Long Mir, MD;  Location: PEDS PROCEDURE ROOM [facility];  Service: Gastroenterology    PR UPPER GI ENDOSCOPY,BIOPSY N/A 08/27/2016    Procedure: UGI ENDOSCOPY; WITH BIOPSY, SINGLE OR MULTIPLE;  Surgeon: Arnold Long Mir, MD;  Location: PEDS PROCEDURE ROOM [facility];  Service: Gastroenterology    PR UPPER GI ENDOSCOPY,BIOPSY N/A 09/02/2017    Procedure: UGI ENDOSCOPY; WITH BIOPSY, SINGLE OR MULTIPLE;  Surgeon: Arnold Long Mir, MD;  Location: PEDS PROCEDURE ROOM [facility];  Service: Gastroenterology    PR UPPER GI ENDOSCOPY,BIOPSY N/A 03/13/2020    Procedure: UGI ENDOSCOPY; WITH BIOPSY, SINGLE OR MULTIPLE;  Surgeon: Helyn Numbers, MD;  Location: [facility] [place] [facility];  Service: Gastroenterology    PR UPPER GI ENDOSCOPY,BIOPSY N/A 09/05/2021    Procedure: UGI ENDOSCOPY; WITH BIOPSY, SINGLE OR MULTIPLE;  Surgeon: Wendall Papa, MD;  Location: GI PROCEDURES [facility];  Service: Gastroenterology    PR UPPER GI ENDOSCOPY,DIAGNOSIS N/A 01/20/2022    Procedure: UGI ENDO, INCLUDE ESOPHAGUS, STOMACH, & DUODENUM &/OR JEJUNUM; DX W/WO COLLECTION SPECIMN, BY BRUSH OR WASH;  Surgeon: Andrey Farmer, MD;  Location: GI PROCEDURES [facility];  Service: Gastroenterology    TUMOR REMOVAL      multiple-head, neck, back, hand, right flank, multiple             Family History   Problem Relation Age of Onset    No Known Problems Mother     No Known Problems Father     No Known Problems Sister     No Known Problems Brother     Stroke Maternal Grandmother  Other Maternal Grandmother         benign lesions of liver and pancreas, further details unknown    Cancer Maternal Grandmother     Diabetes Maternal Grandmother     Hypertension Maternal Grandmother     Thyroid disease Maternal Grandmother     Arthritis Maternal Grandfather     Asthma Maternal Grandfather     COPD Paternal Grandmother         Deceased    Miscarriages / Stillbirths Paternal Grandmother     Alcohol abuse Paternal Grandfather         Deceased    No Known Problems Maternal Aunt     No Known Problems Maternal Uncle     No Known Problems Paternal Aunt     No Known Problems Paternal Uncle     Anesthesia problems Neg Hx     Broken bones Neg Hx     Cancer Neg Hx     Clotting disorder Neg Hx     Collagen disease Neg Hx     Diabetes Neg Hx     Dislocations Neg Hx     Fibromyalgia Neg Hx     Gout Neg Hx     Hemophilia Neg Hx     Osteoporosis Neg Hx     Rheumatologic disease Neg Hx     Scoliosis Neg Hx     Severe sprains Neg Hx     Sickle cell anemia Neg Hx     Spinal Compression Fracture Neg Hx     Melanoma Neg Hx     Basal cell carcinoma Neg Hx     Squamous cell carcinoma Neg Hx         Allergies: Adhesive tape-silicones; Ferrlecit [sodium ferric gluconat-sucrose]; Levofloxacin; Methylnaltrexone; Neomycin; Papaya; Morphine; Zosyn [piperacillin-tazobactam]; Compazine [prochlorperazine]; Iron analogues; Iron dextran; and Latex, natural rubber                  Objective Findings  Precautions / Restrictions  Precautions: Falls precautions  Weight Bearing Status: Non-applicable  Required Braces or Orthoses: Non-applicable     Pain Comments: Pt endorses 7/10 pain  Medical Tests / Procedures: Reviewed in Epic  Equipment / Environment: Vascular access (PIV, TLC, Port-a-cath, PICC), Supplemental oxygen, Telemetry, NGT     Vitals/Orthostatics : HR: 70s at rest and 90s with activity, SpO2: 100% on 2L/[place] throughout, BP: 92/57(69) in supine and 111/57(70) sitting EOB     Living Situation  Living Environment: House  Lives With: Mother, Father  Home Living: Multi-level home, Bed/bath upstairs, 1/2 bath on main level, Stairs to alternate level with rails, Stairs to enter without rails, Stairs to alternate level without rails, Handicapped height toilet, Tub/shower unit, Able to Live on main level with bedroom/bathroom  Number of Stairs to Enter (outside): 2  Rail placement (inside): Rail on right side  Number of Stairs to Alternate level (inside): 14  Caregiver Identified?: Yes  Caregiver Availability: 24 hours  Caregiver Ability: Limited lifting      Cognition: WFL  Cognition comment: Somnolent, often closing eyes while in bed, but able to attend to task when directed, good attention during mobility  Visual/Perception: Within Functional Limits        Skin Inspection: Intact where visualized     Upper Extremities  UE ROM: Right WFL, Left WFL  UE Strength: Right Impaired/Limited, Left Impaired/Limited  RUE Strength Impairment: Pain with movement, Reduced strength  LUE Strength Impairment: Pain with movement, Reduced strength  UE comment: R wrist pain,  pt kept RUE NWB for most of session with only light support on RW, some L shoulder pain with activity    Lower Extremities  LE ROM: Right Impaired/Limited, Left Impaired/Limited  RLE ROM Impairment: Limited AROM  LLE ROM Impairment: Limited AROM  LE Strength: Right Impaired/Limited, Left Impaired/Limited  RLE Strength Impairment: Reduced strength  LLE Strength Impairment: Reduced strength  LE comment: Pt with significant muscle rigidity, which worsened following activity. Decreased ankle PF/DF bilaterally with ankle resting in neutral position, pt only able to achieve ~5* active DF sitting EOB pre-mobility. Able to reach 90/90 hip/knee flexion sitting EOB but with muscle jerking and increased pain with attempts at AROM knee extension and hip flexion in that position.          Sensation: Impaired  Motor/Sensory/Neuro Comments: Pt endorses intermittent numbness in bilateral hands, especially in the mornings when she wakes up. Denies dropping items.    Sitting Balance comments: SBA for static sitting, CGA for dynamic sitting.    Standing Balance comments: CGA for static standing, CGA to Min A with RW for dynamic standing.      Bed Mobility comments: Supine to sit with SBA, HOB elevated and increased time to complete. Pt utilizing LUE for support but protecting R wrist. Sit to supine with Min A for LEs.     Transfer comments: Sit<>stand with CGA and RW, VC for hand placement      Gait Level of Assistance: Minimal assist, patient does 75% or more, Contact guard assist, steadying assist  Gait Assistive Device: Rolling walker  Gait Distance Ambulated (ft): 5 ft (5 ft lateral steps next to bed; 8 ft x2 to/from bathroom commode)  Skilled Treatment Performed: Flat foot contact and shuffling steps, decreased hip and knee flexion     Stairs: Not attempted due to fatigue and increased muscle tone following ambulation            Endurance: Decreased endurance for basic mobility    Patient at end of session: Nurse notified, All needs in reach, In bed    Physical Therapy Session Duration  PT Individual [mins]: 50     Medical Staff Made Aware: RN Masen cleared for therapy and updated following session    AM-PAC-6 click  Help currently need turning over In bed?: A Little - Minimal/Contact Guard Assist/Supervision  Help currently needed sitting down/standing up from chair with arms? : A Little - Minimal/Contact Guard Assist/Supervision  Help currently needed moving from supine to sitting on edge of bed?: A Little - Minimal/Contact Guard Assist/Supervision  Help currently needed moving to and from bed from wheelchair?: A Little - Minimal/Contact Guard Assist/Supervision  Help currently needed walking in a hospital room?: A Little - Minimal/Contact Guard Assist/Supervision  Help currently needed climbing 3-5 steps with railing?: A lot - Maximum/Moderate Assistance    Basic Mobility Score 6 click: 17    6 click Score (in points): % of Functional Impairment, Limitation, Restriction  6: 100% impaired, limited, restricted  7-8: At least 80%, but less than 100% impaired, limited restricted  9-13: At least 60%, but less than 80% impaired, limited restricted  14-19: At least 40%, but less than 60% impaired, limited restricted  20-22: At least 20%, but less than 40% impaired, limited restricted  23: At least 1%, but less than 20% impaired, limited restricted  24: 0% impaired, limited restricted        I attest that I have reviewed the above information.  Signed: Skipper Cliche Zadyn Yardley, PT  Filed 09/27/2022

## 2022-09-27 NOTE — Unmapped (Signed)
ICU TRANSPORT NOTE    Destination: MRI    Departing Unit: [facility]PCU  Pickup Time: 2140    Return Unit: [facility]PCU  Return Time: 2310    [place] patient ID band verified  Allergies Reviewed  Code Status at time of transport: Full    Report received from primary nurse via [employer]. Handoff performed . Patient transported via stretcher under ICU level of care. See vital signs during transport via [employer]. O2 via [place] @ 2 L. Patient is alert and following commands. Patient tolerated procedure/scan well. Universal precautions maintained throughout transport.    Update and care given to primary nurse. See Doc Flowsheets/MAR for additional transportation documentation. Proper body mechanics and safe patient handling equipment were utilized throughout transport.

## 2022-09-27 NOTE — Unmapped (Addendum)
Hospital Medicine Daily Progress Note    Assessment/Plan:    Principal Problem:    Other acute postprocedural pain  Active Problems:    [name]    Intestinal polyps    [name]    Neoplasm related pain    Physical deconditioning    History of colectomy    Intractable abdominal pain    Generalized anxiety disorder with panic attacks    SBO (small bowel obstruction) (CMS-HCC)    Small intestinal bacterial overgrowth (SIBO)  Resolved Problems:    * No resolved hospital problems. *                 [name] [name] is a 22 y.o. female that presented to [facility] with Other acute postprocedural pain.  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    Diffuse muscle stiffness, increased tone  Recent anesthesia  History of desmoid tumors  Transient Lactic Acidosis, type B?  -Patient was undergoing IR guided procedure for desmoid tumors which included steroid injection under ultrasound guidance  -Afterwards developed severe pain and shaking/rigidity  -She was admitted due to uncontrolled pain and rigidity    Plan:  -Pain management with IV tylenol for 24 hours, oxycodone 15 mg every 4 hours and as needed Dilaudid 0.5 mg every 3 hours -- however complicated by ileus. Patient continues to endorse significant pain and inability to tolerated PO thus necessitating adequate pain medications. Will discuss ketamine with patient to help reduce opiate use in setting of ileus.     -Lactate was transient and now resolved -- possible elevated due to episode of muscle rigidity similarly in tonic clonic seizures although patient did not have signs of seizures and was alert during episodes. Concerned this is reaction to procedure but medication etiology unclear versus transient bowel obstruction/compression from ileus?    -Due to chills and lactic acidosis, broad spectrum antibiotics were started however she was narrowed to ceftriaxone today after MRSA nares negative and no other signs of infection on objective data and clinically. If she does have not culture data showing infection after 48 hours -- will consider stopping ceftriaxone entirely and monitoring clinically.     -Follow-up on blood cultures and urine culture -- currently no growth    -CT of chest abdomen pelvis - findings as discussed previously, but does show ileus.    -Discussed with neurology, MRI spine returned unrevealing. Pending paraneoplastic panel. Recommendations for adjusting muscle relaxants as appropriate.     Ileus  -Seen on CT as above  -KUB today with persistent ileus, obtain KUB daily  -NG tube to low intermittent suction  -IV to PO medications as she improves  -IVF maintenance for now with D5LR  -Consider ketamine in place of opiates to help resolve ileus  -If worsening, would get CT abdomen with PO contrast if patient can tolerate PO to rule out SBO    [name]  -Currently holding home nirogacest after discussion with hematology and oncology/solid tumor teamat     Small intestinal bacterial overgrowth  -History of colectomy  -Holding home cefdinir and doxycycline for SIBO in setting of broad-spectrum antibiotics    Prophylaxis  -Ambulation and heparin     Diet  -Nutrition Therapy Regular/House     Code Status / HCDM   -Full Code, Unverified but presumed, based on previous history   -  HCDM [facility] policy, no known patient preference): [name],[name] - Mother - [phone]  HCDM, back-up (If primary HCDM is unavailable): [name],[name] - Father - [phone]      I personally spent 55 minutes face-to-face and non-face-to-face in the care of this patient, which includes all pre, intra, and post visit time on the date of service.  All documented time was specific to the E/M visit and does not include any procedures that may have been performed.    ___________________________________________________________________    Subjective:  Patient states she still feels unwell. Describes rigidity episodes today as well. She would like to keep NGT in place for now.       Labs/Studies:  Labs and Studies from the last 24hrs per EMR and Reviewed    Objective:  Temp:  [36.5 ??C (97.7 ??F)-36.9 ??C (98.4 ??F)] 36.8 ??C (98.2 ??F)  Heart Rate:  [68-108] 70  SpO2 Pulse:  [76-104] 76  Resp:  [10-25] 11  BP: (100-147)/(45-99) 109/61  SpO2:  [97 %-100 %] 100 %    Cardiovascular: Regular rhythm, tachycardic, no murmurs  Lungs: No wheezes, no rales, no rhonchi  Neurological: no myoclonus, no flexural posturing, no hyperreflexia  Abdomen: Tender to palpation in all quadrants, no rebounding or guarding

## 2022-09-27 NOTE — Unmapped (Signed)
Trauma Surgery Consult Progress Note  Date: 09/27/2022  Consulting attending: Dr. Dolphus Jenny  Requesting service: [facility]  Requesting physician: Rocco Serene, MD    Assessment:    Patient is a 22 yo F with h/o [name], [name] that has led to a complex series of hospital presentations including a total colectomy, excision of multiple desmoid tumors, and who most recently underwent injection of steroids into desmoid lesions in her RUE and Left chest on 7/19 with VIR. Post anesthesia, patient began to have diffuse muscle rigidity and shakiness. There were no associated fevers. General surgery was consulted to rule out abdominal compartment syndrome. CT A/P showed trace free fluid in the right upper quadrant adjacent to the liver, of uncertain etiology; and concern for possible ileus but no concern for compartment syndrome. Patient retains desmoid tumor on right abdomen that was consistent with abdominal exam with RLQ pain and firmness.    She continues to complain of RLQ abdominal pain, less rigid and mildly improved from reported exam yesterday.  Nausea appears controlled with NG tube in place.  Venous lactate cleared this morning (0.7 down from 2.3 from 6.4 yesterday.  Remains afebrile, vital signs stable on 2 L nasal cannula for comfort.  MRI spine had no acute findings.  KUB showed diffuse gas but patient has continued bowel function with 5 xBM and adequate urine output.  From surgical standpoint have continued concern that her presentation could be drug-induced, vs reaction superimposed by scattered intraabdominal lesions and symptoms.  Patient states that this reaction is typical for her after anesthesia, although unclear documented etiology for these occurrences.       Recommendations:  - No surgical intervention recommended at this time  - Recommend formal inpatient or outpatient anesthesia appointment for workup of reaction  -Surgery will sign off at this time      Thank you for inviting [name] to assist in the care of your patient. Please feel free to page the Trauma/Acute Care Surgery consult pager at [phone] with any questions or changes in clinical condition.     This patient was discussed and evaluated with Dr. Dolphus Jenny (Attending) who agrees with the plan.                             Vital Signs:  BP 112/52  - Pulse 67  - Temp 36.7 ??C (98.1 ??F) (Oral)  - Resp 11  - Ht 165.1 cm (5' 5)  - Wt 56.6 kg (124 lb 12.5 oz)  - SpO2 99%  - BMI 20.76 kg/m??     Physical Exam:  GENERAL: alert, active, in no moderate distress  HEENT: Pupils equal, round, reactive to light and accommodation No scleral icterus, no proptosis, conjunctivae clear.    NECK: Supple, trachea in the midline, no JVD, no thyromegaly.  PULMONARY: CTAB, no rales. Port site on right chest CDI, no erythema or drainage. Bandage over L chest injection site  CARDIOVASCULAR: Regular rate and rhythm, no murmurs, no peripheral edema.  Radial pulses 2+, dorsalis pedis/posterior tibialis pulses 2+  ABDOMEN: soft, diffuse stiffness most notable on RLQ. Previous surgical scars well healed  Rectal exam: deferred  NEURO: CN II-XII grossly intact.   SKIN: Skin color, texture, turgor normal. No rashes or lesions.  PSYCH: oriented to time, place and person, mood and affect are within normal limits      Labs  Lab Results   Component Value Date    WBC 5.5  09/27/2022    HGB 10.1 (L) 09/27/2022    HCT 30.8 (L) 09/27/2022    PLT 230 09/27/2022       Lab Results   Component Value Date    NA 141 09/27/2022    K 3.5 09/27/2022    CL 111 (H) 09/27/2022    CO2 26.0 09/27/2022    BUN <5 (L) 09/27/2022    CREATININE 0.60 09/27/2022    GLU 89 09/27/2022    CALCIUM 8.7 09/27/2022    MG 2.1 08/20/2022    PHOS 4.7 08/15/2022       Lab Results   Component Value Date    BILITOT <0.2 (L) 09/26/2022    BILIDIR <0.10 08/08/2022    PROT 4.5 (L) 09/26/2022    ALBUMIN 2.4 (L) 09/26/2022    ALT 16 09/26/2022    AST 14 09/26/2022    ALKPHOS 56 09/26/2022    GGT 13 10/31/2011       Lab Results Component Value Date    PT 11.8 07/21/2022    INR 1.06 07/21/2022    APTT 57.7 (H) 05/02/2022         Imaging  XR Abdomen 1 View    Result Date: 09/27/2022  EXAM: XR ABDOMEN 1 VIEW ACCESSION: [id number] UN     CLINICAL INDICATION: 22 years old with PARALYTIC ILEUS      COMPARISON: Earlier same day abdominal radiograph     TECHNIQUE: Semiupright view of the abdomen, 1 image(s)     FINDINGS: Similar positioning and appearance of the esophagogastric tube. Increasing gaseous dilation of central small bowel loops measuring up to 5.0 cm. No acute osseous abnormalities.         Increasing gaseous dilation of central small bowel loops now measuring up to 5.0 cm.         XR Abdomen 1 View    Result Date: 09/27/2022  EXAM: XR ABDOMEN 1 VIEW ACCESSION: [id number] UN     CLINICAL INDICATION: 22 years old with PARALYTIC ILEUS      COMPARISON: Earlier same day abdominal radiograph and priors     TECHNIQUE: Supine view of the abdomen, 2 image(s)     FINDINGS: Similar positioning and appearance of the esophagogastric tube. Similar gaseous distention of multiple loops of bowel with air to the level of the ileoanal anastomotic sutures. No acute osseous abnormalities. Lung bases are clear.         Similar positioning and appearance of the esophagogastric tube.     Similar diffuse gaseous distention of multiple loops of bowel.         MRI Cervical Thoracic Lumbar Spine W Wo Contrast    Result Date: 09/27/2022  EXAM: Magnetic resonance imaging, spine, without and with contrast material, cervical, thoracic and lumbar. DATE: 09/26/2022 11:44 PM ACCESSION: [id number] UN DICTATED: 09/27/2022 12:05 AM INTERPRETATION LOCATION: [facility]     CLINICAL INDICATION: 22 years old Female with Rule out compression from desmoid tumor causing neurological symptoms      COMPARISON: CT chest abdomen pelvis 09/26/2022     TECHNIQUE: Multiplanar MRI was performed through the cervical, thoracic, and lumbar spine prior to and following intravenous contrast administration.     FINDINGS: Bone marrow signal intensity is normal. The cord is unremarkable and the conus medullaris ends at a normal level. There is no abnormal enhancement.     The vertebral bodies are normally aligned. Reversal of the usual cervical lordosis.     CERVICAL: Mild multilevel disc desiccation greatest at C5-C6. Mild  spinal canal narrowing at C4-C6. No significant neural foraminal narrowing.     THORACIC: Moderate right convex scoliosis. No significant spinal canal or neural foraminal narrowing.     LUMBAR: No significant spinal canal or neural foraminal narrowing.     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     The paraspinal tissues are within normal limits.         --No acute findings in the cervical, thoracic or lumbar spine.     --Mild degenerative changes in the cervical spine.         XR Abdomen 1 View    Result Date: 09/27/2022  EXAM: XR ABDOMEN 1 VIEW ACCESSION: [id number] UN     CLINICAL INDICATION: 22 years old with NGT (CATHETER VASCULAR FIT & ADJ)      COMPARISON: Same day 12:10 a.m.     TECHNIQUE: Semiupright view of the abdomen, 1 image(s)     FINDINGS: Esophagogastric tube tip and sideport overlie the anticipated location of the stomach. The tube appears mildly kinked in multiple locations distal to the side port, new from prior. Similar bowel gas pattern with gaseous distention of multiple loops of bowel throughout the partially imaged upper abdomen. No acute osseous abnormality. Partially imaged lung bases are clear.         Satisfactory position of the esophagogastric tube which appears mildly kinked in multiple locations distal to the side port, new from prior. Correlate with tube function.     Diffuse gaseous distention of multiple loops of bowel suggestive of ileus.         XR Abdomen 1 View    Result Date: 09/27/2022  EXAM: XR ABDOMEN 1 VIEW ACCESSION: [id number] UN     CLINICAL INDICATION: 22 years old with NGT (CATHETER VASCULAR FIT & ADJ)      COMPARISON: Same day 5:56 p.m.     TECHNIQUE: Upright view of the abdomen, 1 image(s)     FINDINGS: Slightly advanced esophagogastric tube tip and sideport overlying the proximal stomach. Gaseous distention of loops of small bowel within the upper abdomen measuring as large as 3.1 cm in diameter, similar to prior. No pneumoperitoneum. Lung bases are clear. No acute osseous abnormality.         Satisfactory position of the esophagogastric tube.     ==================== MODIFIED REPORT: (09/27/2022 7:21 AM) This report has been modified from its preliminary version; you may check the prior versions of radiology report, results history link for prior report versions (if they were previously visible in Epic).     -----------------------------------------------         ECG 12 Lead    Result Date: 09/26/2022  NORMAL SINUS RHYTHM NORMAL ECG WHEN COMPARED WITH ECG OF 14-Aug-2022 12:03, NO SIGNIFICANT CHANGE WAS FOUND Confirmed by Eldred Manges [phone]) on 09/26/2022 11:41:24 PM    XR Abdomen Portable    Result Date: 09/26/2022  EXAM: XR ABDOMEN PORTABLE ACCESSION: [id number] UN     CLINICAL INDICATION: 22 years old with NGT (CATHETER VASCULAR FIT & ADJ)      COMPARISON: Earlier same day abdominal radiograph and CT chest abdomen and pelvis     TECHNIQUE: Semiupright view of the abdomen, 1 image(s)     FINDINGS: Nonweighted esophagogastric tube with tip overlying the stomach and sideport likely just beyond the GE junction.     Similar dilated loops of small bowel in the upper abdomen. Excreted contrast in the renal collecting systems.     Examination is otherwise unchanged  from the earlier same day studies.         Nonweighted esophagogastric tube with tip overlying the stomach and sideport likely just beyond the GE junction. Consider slight advancement for optimal positioning.         CT Abdomen Pelvis W Contrast    Result Date: 09/26/2022  EXAM: CT ABDOMEN PELVIS W CONTRAST ACCESSION: [id number] UN CLINICAL INDICATION: 22 years old with peritonitis, lactate 8.3      COMPARISON: CT chest/abdomen/pelvis     TECHNIQUE: A helical CT scan of the abdomen and pelvis was obtained following IV contrast from the lung bases through the pubic symphysis. Images were reconstructed in the axial plane. Coronal and sagittal reformatted images were also provided for further evaluation.         FINDINGS:     LOWER CHEST: Please see dedicated CT chest for characterization of findings above the diaphragm.     LIVER: Normal liver contour.  Subcentimeter hypoattenuating lesions, too small to characterize.     BILIARY: The gallbladder is normal in appearance. Mild prominence of the common bile duct, measuring up to 7mm, similar to prior.     SPLEEN: Normal in size and contour.     PANCREAS: Normal pancreatic contour.  No focal lesions.  No ductal dilation.     ADRENAL GLANDS: Normal appearance of the adrenal glands.     KIDNEYS/URETERS: Symmetric renal enhancement.  No hydronephrosis.  SImilar cyst in the left kidney.     BLADDER: Mild circumferential bladder wall thickening, likely accentuated by underdistention and similar to prior. Heterogeneous hyperattenuation within the bladder, likely excreted IV contrast.     REPRODUCTIVE ORGANS: Uterus is present. No suspicious adnexal lesion.     GI TRACT: Sequela of proctocolectomy with ileoanal anastomosis. Multiple dilated air and fluid-containing small bowel loops in the abdomen without a discrete transition point. Similar fluid-density contents in the neorectum.  The appendix is surgically absent.     PERITONEUM, RETROPERITONEUM AND MESENTERY: No free air.  Trace perihepatic free fluid  along the inferior margin of the right hepatic lobe (2:67, 4:32)).  No loculated fluid collection. Similar appearance of ill-defined lesion in the lower abdominal mesentery with surrounding inflammatory fat stranding (2:90).     LYMPH NODES: No adenopathy.     VESSELS: Hepatic and portal veins are patent. Normal caliber aorta.      BONES and SOFT TISSUES: No aggressive osseous lesions. SImilar 2.6 cm right inferior rectus intramuscular mass (2:128). Similar stranding adjacent to the left femoral vessels. Similar bilateral gluteal subcutaneous nodules.             Trace free fluid in the right upper quadrant adjacent to the liver, of uncertain etiology.      Multiple dilated air and fluid-containing bowel loops without a discrete transition point, question ileus.     Similar right inferior rectus mass and lower abdominal mesenteric soft tissue with surrounding stranding.                     CTA Chest W Contrast    Result Date: 09/26/2022  EXAM: CTA CHEST W CONTRAST ACCESSION: [id number] UN     CLINICAL INDICATION: concern for PE     TECHNIQUE: Contiguous axial images were reconstructed through the chest following a single breath hold helical acquisition during the administration of intravenous contrast material. Images were reformatted in the coronal and sagittal planes. MIP slabs were also constructed.     COMPARISON: 07/16/2021 CTA Chest with contrast.  FINDINGS:     PULMONARY ARTERIES: No emboli in either lung. Main pulmonary artery is normal in size.     HEART AND VASCULATURE: Cardiac chambers are normal in size. No pericardial effusion. Aorta is normal in caliber.     LUNGS, AIRWAYS, AND PLEURA: Lungs are clear. Central airways are patent. No pleural effusion or pneumothorax.     MEDIASTINUM AND LYMPH NODES: No enlarged intrathoracic, axillary, or supraclavicular lymph nodes. No mediastinal mass or other abnormality.     CHEST WALL AND BONES: Unremarkable.     UPPER ABDOMEN: Unremarkable.     OTHER: No other findings.             No emboli and no acute findings.    XR Abdomen 1 View    Result Date: 09/26/2022  EXAM: XR ABDOMEN 1 VIEW ACCESSION: [id number] UN     CLINICAL INDICATION: 22 years old with ABDOMINAL PAIN  -  UNSPECIFIED SITE      COMPARISON: CT chest abdomen pelvis 09/16/2022     TECHNIQUE: Supine views of the abdomen, 2 images     FINDINGS:     Diffuse gaseous distention of multiple loops of small and large bowel with borderline small bowel dilatation measuring up to 3.1 cm. The lung bases are clear. No acute osseous abnormalities. Suture material throughout the lower abdominal quadrants and pelvis.         Diffuse gaseous distention of multiple loops of small and large bowel, suggestive of ileus.    XR Chest Portable    Result Date: 09/26/2022  EXAM: XR CHEST PORTABLE ACCESSION: [id number] UN     CLINICAL INDICATION: CHEST PAIN      TECHNIQUE: Single View AP Chest Radiograph.     COMPARISON: 07/31/2022     FINDINGS:     Right IJ central venous catheter with tip in superior right atrium.     Lungs are clear.  No pleural effusion or pneumothorax.     Normal heart size and mediastinal contours.             No acute abnormalities.

## 2022-09-27 NOTE — Unmapped (Signed)
Follow-up Consult Note        Requesting Attending Physician:  Rocco Serene, MD  Service Requesting Consult: Med [facility])     Assessment and Plan          [name] [name] is a 22 y.o. female on whom I have been asked by Rocco Serene, MD to consult for muscle stiffness.     #Increased Tone  Worsening BLE stiffness since procedure 7/18. 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 MRI spine negative for any cord compression.  Does have chronic component of muscle stiffness and spasms for which she is on baclofen. Less likely acute dystonic reaction, has received anti-dopaminergic medications this admission though is not medication naive and would not expect continued symptoms 24 hours after administration. Clinical picture not consistent with neuroleptic malignant disorder or serotonin syndrome. Given neoplastic process could consider a paraneoplastic etiology of increased tone though feel this is less likely. Less familiar with adverse reactions to anesthetic medications as clinically questioned by primary team though appears unlikely, defer to anesthesia if remains a concern. Would continue baclofen as needed for this.    RECOMMENDATIONS:   Continue baclofen vs switch to tizanidine for symptomatic treatment (allergy to inactive ingredient of tizanidine so would check with pharmacy prior to initiation)   Follow paraneoplastic antibody panel    Thank you for the consult. We will sign off at this time. Please page [phone] for any other concerns.   This patient was seen and discussed with Dr.  Fabian November , who agrees with the above assessment and plan.      Marquis Buggy, MD  PGY-2, Neurology      ATTESTATION NOTE:  I saw and evaluated the patient, participating in the key portions of the service.  I reviewed the resident???s note and agree with the resident???s findings and plan as documented in their note.    [name], DO  Assistant Professor  Department of Neurology  [place] [facility]         Subjective        Reason for Consult: muscle stiffness     Interval events: doing about the same, still concerned with pain.     HPI: 7/18 pain following steroid/anesthetic injection of desmoid tumor in the right forearm and left upper chest. RRT on 7/19 for chest pain, diaphoresis and tachycardia, flexion in all extremities. Muscle tightness provoked by increasing pain           Objective        Temp:  [36.5 ??C (97.7 ??F)-36.9 ??C (98.4 ??F)] 36.8 ??C (98.2 ??F)  Heart Rate:  [68-108] 70  SpO2 Pulse:  [76-104] 76  Resp:  [10-25] 11  BP: (100-147)/(45-99) 109/61  MAP (mmHg):  [5-112] 74  SpO2:  [97 %-100 %] 100 %  No intake/output data recorded.    Physical Exam:  General Appearance: ill appearing. In no acute distress.  HEENT: Head is atraumatic and normocephalic. Sclera anicteric without injection. Oropharyngeal membranes are moist with no erythema or exudate.  NG tube  Lungs:  Normal work of breathing  Heart: Regular rate and rhythm.  Extremities: No clubbing, cyanosis, or edema.    Neurological Examination:     Mental Status: Alert, conversant, able to follow conversation and interview. Spontaneous speech was fluent without  word finding pauses, dysarthria, or paraphasic errors. Comprehension was intact to simple and multi-step commands. Memory for recent and remote events was intact.    Cranial Nerves: PERRL. Pursuit eye movements were uninterrupted with full range and without more than end-gaze nystagmus. Facial sensation intact bilaterally to light touch on the forehead, cheek, and chin. Face symmetric at rest. Normal facial movement bilaterally, including forehead, eye closure and grimace/smile. Hearing intact to conversation. Shoulder shrug full strength bilaterally. Palate movement is symmetric. Tongue protrudes midline and tongue movements are normal.    Motor Exam: Previously: Normal bulk. Normal tone in bilateral upper extremities. Increased tone in bilateral lower extremities without spasticity. Worse at ankles bilaterally than the knees and more normal at the hips.  RLE tone on the Modified Ashworth Scale: 2 (more marked increase in muscle tone through most of the ROM, but affected part easily moved).  LLE tone on the Modified Ashworth Scale: 3 (considerable increase in muscle tone passive, movement difficult).  Some give-way weakness throughout.  UE R/L: biceps 5/5, triceps 5/5, interossei 4+/4+, and hand grip mildly weak/strong   LE R/L: hip flexion 5-/5-, quadriceps 5-/5-, hamstrings 5-/5-, dorsiflexion 5/5, and plantar flexion 5/5.      Reflexes: Previously  R/L: biceps 2+/2+, brachioradialis 2+/2+, and patella 2+/3+ with 3-5 beats of clonus. Unable to obtain ankle reflexes given plantarflexion with limited ROM, no clear clonus within this limitation. No crossed adductors.     Sensory:  Intact to light touch    Cerebellar/Coordination/Gait: Finger-to-nose is normal without ataxia or dysmetria bilaterally.          Medications:  Scheduled medications:    acetaminophen  1,000 mg Oral Q8H    buprenorphine-naloxone  0.25 Film Sublingual TID    [Provider Hold] cefdinir  300 mg Oral Q12H SCH    cefTRIAXone  2 g Intravenous Q24H    [Provider Hold] doxycycline  100 mg Oral BID    famotidine  20 mg Oral Nightly    lidocaine  2 patch Transdermal Daily    mirtazapine  15 mg Oral Nightly    pantoprazole  40 mg Oral Daily     Continuous infusions:    dextrose 5% lactated ringers       PRN medications: acetaminophen, baclofen, calcium carbonate, HYDROmorphone, LORazepam, melatonin, ondansetron, oxyCODONE, phenol, polyethylene glycol, promethazine     Diagnostic Studies      All Labs Last 24hrs:   Recent Results (from the past 24 hour(s))   CBC    Collection Time: 09/26/22  2:26 PM   Result Value Ref Range    WBC 6.3 3.6 - 11.2 10*9/L    RBC 3.20 (L) 3.95 - 5.13 10*12/L    HGB 8.5 (L) 11.3 - 14.9 g/dL    HCT 16.1 (L) 09.6 - 44.0 %    MCV 80.6 77.6 - 95.7 fL    MCH 26.5 25.9 - 32.4 pg    MCHC 32.9 32.0 - 36.0 g/dL    RDW 04.5 40.9 - 81.1 %    MPV 9.2 6.8 - 10.7 fL    Platelet 196 150 - 450 10*9/L   Lactate, Venous, Whole Blood    Collection Time: 09/26/22  2:26 PM   Result Value Ref Range    Lactate, Venous 6.4 (HH) 0.5 - 1.8 mmol/L   Urinalysis with Microscopy    Collection Time: 09/26/22  5:20 PM   Result Value Ref Range    Color, UA Colorless     Clarity, UA Clear  Specific Gravity, UA 1.032 (H) 1.003 - 1.030    pH, UA 7.5 5.0 - 9.0    Leukocyte Esterase, UA Negative Negative    Nitrite, UA Negative Negative    Protein, UA Negative Negative    Glucose, UA Negative Negative    Ketones, UA Negative Negative    Urobilinogen, UA <2.0 mg/dL <4.0 mg/dL    Bilirubin, UA Negative Negative    Blood, UA Negative Negative    RBC, UA <1 <=4 /HPF    WBC, UA <1 0 - 5 /HPF    Squam Epithel, UA 1 0 - 5 /HPF    Bacteria, UA None Seen None Seen /HPF    Mucus, UA Rare (A) None Seen /HPF   Lactate, Venous, Whole Blood    Collection Time: 09/26/22  9:13 PM   Result Value Ref Range    Lactate, Venous 2.3 (H) 0.5 - 1.8 mmol/L   MRSA Screen    Collection Time: 09/26/22  9:43 PM    Specimen: Nose; Nares   Result Value Ref Range    MRSA PCR Not Detected Not Detected   Lactate, Venous, Whole Blood    Collection Time: 09/27/22  4:17 AM   Result Value Ref Range    Lactate, Venous 0.7 0.5 - 1.8 mmol/L   Basic Metabolic Panel    Collection Time: 09/27/22  4:17 AM   Result Value Ref Range    Sodium 141 135 - 145 mmol/L    Potassium 3.5 3.4 - 4.8 mmol/L    Chloride 111 (H) 98 - 107 mmol/L    CO2 26.0 20.0 - 31.0 mmol/L    Anion Gap 4 (L) 5 - 14 mmol/L    BUN <5 (L) 9 - 23 mg/dL    Creatinine 9.81 1.91 - 1.02 mg/dL    eGFR CKD-EPI (4782) Female >90 >=60 mL/min/1.69m2    Glucose 89 70 - 99 mg/dL    Calcium 8.7 8.7 - 95.6 mg/dL   CBC w/ Differential Collection Time: 09/27/22  4:17 AM   Result Value Ref Range    WBC 5.5 3.6 - 11.2 10*9/L    RBC 3.80 (L) 3.95 - 5.13 10*12/L    HGB 10.1 (L) 11.3 - 14.9 g/dL    HCT 21.3 (L) 08.6 - 44.0 %    MCV 81.2 77.6 - 95.7 fL    MCH 26.5 25.9 - 32.4 pg    MCHC 32.6 32.0 - 36.0 g/dL    RDW 57.8 46.9 - 62.9 %    MPV 9.3 6.8 - 10.7 fL    Platelet 230 150 - 450 10*9/L    Neutrophils % 45.7 %    Lymphocytes % 37.6 %    Monocytes % 10.2 %    Eosinophils % 5.9 %    Basophils % 0.6 %    Absolute Neutrophils 2.5 1.8 - 7.8 10*9/L    Absolute Lymphocytes 2.1 1.1 - 3.6 10*9/L    Absolute Monocytes 0.6 0.3 - 0.8 10*9/L    Absolute Eosinophils 0.3 0.0 - 0.5 10*9/L    Absolute Basophils 0.0 0.0 - 0.1 10*9/L    Hypochromasia Slight (A) Not Present

## 2022-09-28 LAB — CBC
HEMATOCRIT: 31.5 % — ABNORMAL LOW (ref 34.0–44.0)
HEMATOCRIT: 30.9 % — ABNORMAL LOW (ref 34.0–44.0)
HEMOGLOBIN: 10.1 g/dL — ABNORMAL LOW (ref 11.3–14.9)
HEMOGLOBIN: 10.5 g/dL — ABNORMAL LOW (ref 11.3–14.9)
MEAN CORPUSCULAR HEMOGLOBIN CONC: 32.2 g/dL (ref 32.0–36.0)
MEAN CORPUSCULAR HEMOGLOBIN CONC: 33.9 g/dL (ref 32.0–36.0)
MEAN CORPUSCULAR HEMOGLOBIN: 26.4 pg (ref 25.9–32.4)
MEAN CORPUSCULAR HEMOGLOBIN: 27.1 pg (ref 25.9–32.4)
MEAN CORPUSCULAR VOLUME: 81.9 fL (ref 77.6–95.7)
MEAN CORPUSCULAR VOLUME: 79.8 fL (ref 77.6–95.7)
MEAN PLATELET VOLUME: 9.1 fL (ref 6.8–10.7)
MEAN PLATELET VOLUME: 9.3 fL (ref 6.8–10.7)
PLATELET COUNT: 217 10*9/L (ref 150–450)
PLATELET COUNT: 223 10*9/L (ref 150–450)
RED BLOOD CELL COUNT: 3.85 10*12/L — ABNORMAL LOW (ref 3.95–5.13)
RED BLOOD CELL COUNT: 3.87 10*12/L — ABNORMAL LOW (ref 3.95–5.13)
RED CELL DISTRIBUTION WIDTH: 12.5 % (ref 12.2–15.2)
RED CELL DISTRIBUTION WIDTH: 12.4 % (ref 12.2–15.2)
WBC ADJUSTED: 5.2 10*9/L (ref 3.6–11.2)
WBC ADJUSTED: 6.4 10*9/L (ref 3.6–11.2)

## 2022-09-28 LAB — COMPREHENSIVE METABOLIC PANEL
ALBUMIN: 3.1 g/dL — ABNORMAL LOW (ref 3.4–5.0)
ALKALINE PHOSPHATASE: 64 U/L (ref 46–116)
ALT (SGPT): 14 U/L (ref 10–49)
ANION GAP: 3 mmol/L — ABNORMAL LOW (ref 5–14)
AST (SGOT): 13 U/L (ref <=34)
BILIRUBIN TOTAL: 0.3 mg/dL (ref 0.3–1.2)
BLOOD UREA NITROGEN: <5 mg/dL — ABNORMAL LOW (ref 9–23)
CALCIUM: 8.7 mg/dL (ref 8.7–10.4)
CHLORIDE: 109 mmol/L — ABNORMAL HIGH (ref 98–107)
CO2: 30 mmol/L (ref 20.0–31.0)
CREATININE: 0.57 mg/dL
EGFR CKD-EPI (2021) FEMALE: >90 mL/min/{1.73_m2} (ref >=60)
GLUCOSE RANDOM: 99 mg/dL (ref 70–99)
POTASSIUM: 3.2 mmol/L — ABNORMAL LOW (ref 3.4–4.8)
PROTEIN TOTAL: 5.7 g/dL (ref 5.7–8.2)
SODIUM: 142 mmol/L (ref 135–145)

## 2022-09-28 LAB — PHOSPHORUS: PHOSPHORUS: 3.3 mg/dL (ref 2.4–5.1)

## 2022-09-28 LAB — MAGNESIUM: MAGNESIUM: 2 mg/dL (ref 1.6–2.6)

## 2022-09-28 MED ADMIN — iohexol (OMNIPAQUE) 350 mg iodine/mL solution 100 mL: 100 mL | INTRAVENOUS | @ 02:00:00 | Stop: 2022-09-27 | NDC 00407141498

## 2022-09-28 MED ADMIN — cefTRIAXone (ROCEPHIN) 2 g in sodium chloride 0.9 % (NS) 100 mL IVPB-MBP: 2 g | INTRAVENOUS | @ 11:00:00 | Stop: 2022-09-28 | NDC 68330000601

## 2022-09-28 MED ADMIN — buprenorphine-naloxone (SUBOXONE) 2-0.5 mg SL film 0.5 mg of buprenorphine: .25 | SUBLINGUAL | @ 15:00:00 | NDC 52427069211

## 2022-09-28 MED ADMIN — HYDROmorphone (PF) injection Syrg 0.5 mg: .5 mg | INTRAVENOUS | @ 12:00:00 | Stop: 2022-09-28 | NDC 00409426411

## 2022-09-28 MED ADMIN — HYDROmorphone (PF) injection Syrg 0.5 mg: .5 mg | INTRAVENOUS | @ 16:00:00 | Stop: 2022-09-28 | NDC 00409426411

## 2022-09-28 MED ADMIN — potassium chloride 10 mEq in 100 mL IVPB: 10 meq | INTRAVENOUS | @ 11:00:00 | Stop: 2022-09-28 | NDC 72789005782

## 2022-09-28 MED ADMIN — acetaminophen (OFIRMEV) 10 mg/mL injection 500 mg 50 mL: 500 mg | INTRAVENOUS | @ 15:00:00 | Stop: 2022-09-28 | NDC 70004000122

## 2022-09-28 MED ADMIN — dextrose 5 % in lactated ringers infusion: 100 mL/h | INTRAVENOUS | NDC 82468011674

## 2022-09-28 MED ADMIN — buprenorphine-naloxone (SUBOXONE) 2-0.5 mg SL film 0.5 mg of buprenorphine: .25 | SUBLINGUAL | @ 22:00:00 | NDC 52427069211

## 2022-09-28 MED ADMIN — acetaminophen (OFIRMEV) 10 mg/mL injection 500 mg 50 mL: 500 mg | INTRAVENOUS | @ 01:00:00 | Stop: 2022-09-28 | NDC 70004000122

## 2022-09-28 MED ADMIN — lidocaine 4 % patch 2 patch: 2 | TRANSDERMAL | @ 12:00:00 | Stop: 2022-09-28 | NDC 63323047930

## 2022-09-28 MED ADMIN — buprenorphine-naloxone (SUBOXONE) 2-0.5 mg SL film 0.5 mg of buprenorphine: .25 | SUBLINGUAL | @ 17:00:00 | NDC 52427069211

## 2022-09-28 MED ADMIN — LORazepam (ATIVAN) injection 0.5 mg: .5 mg | INTRAVENOUS | @ 19:00:00 | NDC 76329826101

## 2022-09-28 MED ADMIN — pantoprazole (Protonix) injection 40 mg: 40 mg | INTRAVENOUS | @ 12:00:00 | NDC 82009001190

## 2022-09-28 MED ADMIN — potassium chloride 10 mEq in 100 mL IVPB: 10 meq | INTRAVENOUS | @ 16:00:00 | Stop: 2022-09-28 | NDC 72789005782

## 2022-09-28 MED ADMIN — promethazine (PHENERGAN) injection 6.25 mg: 6.25 mg | INTRAVENOUS | @ 15:00:00 | NDC 60977000143

## 2022-09-28 MED ADMIN — promethazine (PHENERGAN) injection 6.25 mg: 6.25 mg | INTRAVENOUS | @ 22:00:00 | NDC 60977000143

## 2022-09-28 MED ADMIN — dextrose 5 % in lactated ringers infusion: 100 mL/h | INTRAVENOUS | @ 11:00:00 | Stop: 2022-09-28 | NDC 82468011674

## 2022-09-28 MED ADMIN — potassium chloride 10 mEq in 100 mL IVPB: 10 meq | INTRAVENOUS | @ 13:00:00 | Stop: 2022-09-28 | NDC 72789005782

## 2022-09-28 MED ADMIN — promethazine (PHENERGAN) injection 6.25 mg: 6.25 mg | INTRAVENOUS | @ 01:00:00 | NDC 60977000143

## 2022-09-28 MED ADMIN — HYDROmorphone (PF) injection Syrg 0.5 mg: .5 mg | INTRAVENOUS | @ 19:00:00 | Stop: 2022-09-28 | NDC 00409426411

## 2022-09-28 MED ADMIN — dextrose 5 % in lactated ringers infusion: 100 mL/h | INTRAVENOUS | @ 17:00:00 | Stop: 2022-09-28 | NDC 82468011674

## 2022-09-28 MED ADMIN — ondansetron (ZOFRAN) injection 4 mg: 4 mg | INTRAVENOUS | @ 12:00:00 | NDC 71930001752

## 2022-09-28 MED ADMIN — potassium chloride 10 mEq in 100 mL IVPB: 10 meq | INTRAVENOUS | @ 15:00:00 | Stop: 2022-09-28 | NDC 72789005782

## 2022-09-28 MED ADMIN — potassium chloride 10 mEq in 100 mL IVPB: 10 meq | INTRAVENOUS | @ 14:00:00 | Stop: 2022-09-28 | NDC 72789005782

## 2022-09-28 MED ADMIN — HYDROmorphone (PF) injection Syrg 0.5 mg: .5 mg | INTRAVENOUS | @ 02:00:00 | Stop: 2022-09-28 | NDC 00409426411

## 2022-09-28 MED ADMIN — ondansetron (ZOFRAN) injection 4 mg: 4 mg | INTRAVENOUS | @ 19:00:00 | NDC 71930001752

## 2022-09-28 MED ADMIN — potassium chloride 10 mEq in 100 mL IVPB: 10 meq | INTRAVENOUS | @ 12:00:00 | Stop: 2022-09-28 | NDC 72789005782

## 2022-09-28 MED ADMIN — lactated Ringers infusion: 10 mL/h | INTRAVENOUS | @ 19:00:00 | NDC 11845118709

## 2022-09-28 MED ADMIN — lidocaine 2000 mg in dextrose 5 % 250 mL (8 mg/mL) infusion PMB: 1 mg/kg/h | INTRAVENOUS | @ 19:00:00 | Stop: 2022-09-30 | NDC 63323048457

## 2022-09-28 NOTE — Unmapped (Signed)
VENOUS ACCESS ULTRASOUND PROCEDURE NOTE    Indications:   Poor venous access.    The Venous Access Team has assessed this patient for the placement of a PIV. Ultrasound guidance was necessary to obtain access.     Procedure Details:  Identity of the patient was confirmed via name, medical record number and date of birth. The availability of the correct equipment was verified.    The vein was identified for ultrasound catheter insertion.  Field was prepared with necessary supplies and equipment.  Probe cover and sterile gel utilized.  Insertion site was prepped with chlorhexidine solution and allowed to dry.  The catheter extension was primed with normal saline. A(n) 22 gauge 1.75 catheter was placed in the L Forearm with 1attempt(s). See LDA for additional details.    Catheter aspirated, 3 mL blood return present. The catheter was then flushed with 10 mL of normal saline. Insertion site cleansed, and dressing applied per manufacturer guidelines. The catheter was inserted without difficulty by Solita Macadam, RN.     CARE RN was notified.     Thank you,     Sidhant Helderman Jean Rosenthal, RN Venous Access Team   [phone]     Workup / Procedure Time:  15 minutes    See images below:

## 2022-09-28 NOTE — Unmapped (Signed)
Luminal Gastroenterology Consult Service   Initial Consultation         Assessment and Recommendations:   [name] [name] is a 22 y.o. 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 The patient is seen in consultation at the request of Rocco Serene, MD (Med [facility])) for concern for coffee ground emesis and ileus.     Concern for Coffee-Ground Emesis   We were consulted today due to concern for coffee-ground emesis from NG tube that began this morning.  When we evaluated the patient this afternoon, she was having light yellow output from her NG tube and her suction cannister contained layering of dark green/yellow fluid with a ? small amount of blood products at the base of the cannister. We suspect that the coffee-ground emesis seen earlier today was related to bleeding and trauma from the NG tube, and fortunately, this appears to have resolved. We are less concerned for alternative sources of an upper GIB (peptic ulcer, AMV, dieulafoy's lesion, etc) as recent EGD 01/2022 was largely unremarkable. Since she remains hemodynamically stable, her hemoglobin also remains stable at ~10, and given her prior challenges with anesthesia, we recommend ongoing conservative management. If she develops major bleeding with hemodynamic instability, we would consider a repeat upper endoscopy at that time.   - Continue BID PPI   - Attempt to remove NG as soon as able to minimize risk of trauma   - Daily CBC    Ileus   Patient with history of recurrent ileus and chronic abdominal pain. CT a/p 7/19 that was obtained in the setting of a rapid response showed multiple dilated air and fluid containing bowel loops without a discrete transition point, indicative of ileus. On exam, she reports that her abdomen feels distended, however, it is soft and largely non-tender. We suspect that her ileus is secondary to opioids which she has been receiving for her acute on chronic abdominal pain. We discussed using Relistor for opioid-induced constipation, but this was previously tried and not well tolerated. We also do not recommend Neostigmine, which generally is more effective for large bowel. At this time, we recommend conservative management with ongoing NG tube decompression, minimization of narcotics, and physical activity as able.   - Continue NG tube to suction   - Minimize narcotics as able  - Agree with consult to Chronic Pain team   - Physical activity as tolerated  - Could consider repeat trial of Relistor if no improvement in next few days    Issues Impacting Complexity of Management:  -None    Recommendations discussed with the patient's primary team. We will continue to follow along with you.    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 Patient and her mother think this is related to having received only propofol during the procedure rather than her regular protocol. Her hospital course has been complicated  by abdominal pain and rapid response for difficulty breathing and muscle rigidity.    Since this morning, there has been concern for dark, coffee-ground emesis from the patient's NG tube.  She has never noticed this before. She has chronic abdominal pain and specifically endorses pain in her epigastric and right lower quadrant regions.  She also feels distended compared to baseline. She has had persistent nausea and last episode of vomiting was yesterday.  She endorses a burning sensation in the back of her throat that is present all the time. She is currently taking PO but has required TPN in the past to meet her nutritional needs.  She also says she has been increasingly diaphoretic since undergoing an egg retrieval a few weeks ago. She typically has 5-6 watery stools per day but has had none today.    Objective:   Temp:  [36.7 ??C (98.1 ??F)-36.9 ??C (98.5 ??F)] 36.9 ??C (98.5 ??F)  Heart Rate:  [64-83] 80  SpO2 Pulse:  [65-81] 75  Resp:  [9-20] 15  BP: (102-126)/(51-70) 123/61  SpO2:  [98 %-100 %] 98 %    Gen: Young female in NAD  Eyes: Sclera anicteric, EOMI, PERRLA  HENT: Atraumatic, normocephalic, MMM  Heart: RRR, S1, S2, no M/R/G, no chest wall tenderness  Lungs: CTAB, no crackles or wheezes, no use of accessory muscles  Abdomen: Hypoactive bowel sounds, soft, mildly distended, no rebound/guarding, no hepatosplenomegaly, NG tube in place draining light yellow fluid, green/yellow fluid in the suction cannister   Extremities: No LE edema  Neuro: CN II-XI grossly intact, no focal deficits.  Skin:  No rashes, lesions on clothed exam  Psych: Alert, oriented, normal mood and affect.      Pertinent Labs/Studies:     KUB 09/28/22  Multiple air-filled dilated loops of bowel in the central abdomen appear similar to slightly decreased in caliber. Given that the enteric contrast is now located within the region of the ileoanal anastomosis, findings are favored to represent ileus.     CT a/p with contrast 09/27/2022   Similar-appearing bowel pattern with multiple dilated air and fluid containing bowel loops without a discrete transition point which may be indicative of ileus.      Otherwise stable abdominopelvic findings compared to 09/26/2022     Pouchoscopy 03/2022  The perianal and digital rectal examinations were normal. The neo-terminal ileum appeared normal.       A localized area of mucosa in the ileoanal pouch was mildly friable with        contact bleeding along the proximal suture lines. Coagulation for        bleeding prevention using argon beam at 0.3 liters/minute and 20 watts        was successful.       There was a rectal cuff beginning at at 1 cm from the anal verge,        characterized by healthy appearing mucosa. The cuff extended 1 cm in        length.    EGD 01/2022  No evidence of blood or source of hematochezia seen on upper endoscopy.       The esophagus was normal.       Multiple pedunculated and sessile polyps with no bleeding and no        stigmata of recent bleeding were found in the entire examined stomach.        Previously biopsied and consistent with adenomas       The examined duodenum was  normal.       The examined jejunum was normal.

## 2022-09-28 NOTE — Unmapped (Addendum)
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 Hospital course complicated by severe ileus most likely due to narcotic bowel.    Muscle spasms/rigidity  Patient had episodes of diffuse muscle spasms and rigidity without clear cause.  No seizure-like sequelae.  Noted to have elevated lactic acidosis during episode up to 8.3 which subsequently down trended.  No fevers, no hemodynamic instability, no significant leukocytosis, however patient complained of rigors and chills prior to procedure with VIR. Placed on broad-spectrum antibiotics x 48 hrs until infectious workup (BCx, UA/UCx, CXR, CT C/A/P) was negative. There was concern that episodes were related to general anesthesia with VIR procedure, however, patient has tolerated these medications before without issues.  She did not have any significant fevers concerning for malignant hyperthermia.  Neurology evaluated and was concerned about possible desmoid tumor infiltration into spine, but MRI C/T/L spine was unrevealing.  Paraneoplastic panel showed isolated weakly positive calcium channel binding P/Q; per Neurology, this is a nonspecific and clinically insignificant finding most likely related to her underlying history of desmoid tumor and did not explain her muscle spasms.  She underwent EMG that was normal although with ongoing rigidity and stiffness she was empirically started on diazepam for possible stiff person syndrome guided by neurology.  An autoimmune myelopathy panel returned within normal limits and negative for anti-GAD antibodies making stiff person syndrome unlikely. She was managed with baclofen and scheduled valium. Continued to have symptoms that were improved with diazepam. Baclofen weaned to as needed. She was ultimately discharged to [facility] to continue building strength and was discharged wiht ***diazepam.     GNR Enterobactericeae bacteremia/Candida fungemia 8/13 : Developed fevers and found to have GNR and yeast in her blood.  Source presumably her RIJ tunneled line that she had had since 01/2022 for TPN in the past.  Tunneled line removed and triple lumen central line placed 8/14.  Negative cultures since 8/17.  ID consulted.  TEE without vegetations.  Per ID, she received cefazolin and fluconazole through 8/31.    [name], desmoid fibromatosis  Followed by Dr. [name] of [facility].  Had recently started nirogacestat, but this was intermittently held during this stay. Restarted on *** with good tolerance. Started at 50 mg BID on 9/3 with plans to advance by 50 mg each week to goal of 150 mg BID.     Chronic pain with acute exacerbation  Patient had significant pain during admission requiring escalation of her home oxycodone as well as initiation of IV hydromorphone due to inability to tolerate PO intake.  However, subsequently developed significant ileus likely due to narcotic bowel.  Chronic Pain anesthesia team consulted and facilitated lidocaine infusion 7/21-7/24.  Continued to require opioids and had ongoing ileus symptoms, so was started on ketamine infusion 8/1 and weaned off all full agonist opioids by 8/3.  After completion of ketamine 8/9, pain again became an issue in the setting of her line infection on 8/13 and was restarted on IV hydromorphone and oxycodone.  Pain was located in abdomen and limbs, then developed acute back pain.  Imaging including CT L spine, pelvic and upper thighs negative for acute changes.  XR knees showed normal alignment.  Was able to taper meds but continued to require ATC  opiates, so was seen again by Pain on 9/3.  Suboxone increased from 0.5/0.125 to 1/0.25 mg tid to try to aid weaning ***    Ileus due to narcotic bowel  Need for parenteral nutrition  Found to have significant ileus on AXR shortly after admission. CT A/P was also consistent with ileus, with no discrete transition point.  She was unable to tolerate PO intake and had recurrent nausea and vomiting requiring NG tube for decompression.  Due to multiple days without ability to tolerate PO intake and weakness, she was started on TPN on 7/22 (had previously been able to stop as outpatient in 06/2022).  High suspicion for narcotic bowel related to chronic opioid use, exacerbated by acute illness.  Had no improvement with naloxegol, possibly due to minimal absorption.  Treated with methylnaltrexone and was gradually weaned off opioids with ketamine infusion as above. Once patient had adequate return of bowel function, her diet was slowly advanced.  An ND Corpak tube was placed at the time of her TEE, and she was continued on continuous tube feeds. She tolerated tube feeds and improving oral intake with regular BMs, so was changed to cyclic feeding on 9/6. ***    Transaminitis.  Periodic rises in AST and ALT.  Liver ultrasound with Doppler has been unremarkable.  Attributed to TPN.  Then had another rise in setting of antibiotics which also self resolved.    Syncope/pre-syncope  While attempting ambulation, patient continued to have periodic episodes of lightheadedness and occasional brief episodes of syncope, possibly related to muscle rigidity as noted above. Associated with sinus tachycardia. Negative work up included CTA chest (neg for PE), TTE, cosyntropin stim test, thyroid studies, and EKG reassuring.  Given profound iron deficiency, IV iron infusion was arranged and given (see below). Continued to be monitored on telemetry during admission.    Iron deficiency anemia  Ferritin 5, iron saturation 6%. Has had prior reactions with numerous attempts to give IV iron. As patient had recently seen Allergy in the outpatient setting for recurrent iron reactions, IV iron infusion (Feraheme 1020 mg) was given while inpatient. Unfortunately preferred iron formulation (monoferric) was not approved. Preferred pre-med protocol included a one week long course of cetirizine 20 mg BID, though this was shortened to 48 hours while inpatient (after discussion with allergy and shared decision making with the patient). Steroid pretreatment was given as follows: prednisone 50 mg 13 hours prior, prednisone 50 mg 7 hours prior, and hydrocortisone 40 mg 30 minutes prior. IV benadryl and famotidine 40 mg IV were given about 30 minutes prior to the infusion as well. Unfortunately she did still have an itchy rash develop but this improved with slowing the infusion and giving further doses of IV benadryl.     Generalized anxiety disorder/depression:  Previously on lorazepam which has been changed to diazepam after discussion with psychiatry for stiffness. Was seen by Psychiatry during the admission and given supportive psychotherapy.    Gastric ulcer  Patient with history of gastric polyps and GI bleeding related to ileoanal pouchitis and ulcerations. While on NG tube to LIS this admission, noted to have coffee ground output, however hemoglobin remained stable. Gastroenterology consulted and recommended conservative management with PPI BID serial hemoglobin monitoring.     Small intestinal bacterial overgrowth  Home antibiotics with cefdinir and doxycycline were held in setting of broad-spectrum antibiotics and inability to tolerate PO intake.

## 2022-09-28 NOTE — Unmapped (Signed)
Adult Nutrition Brief Note      Visit Type: MD Consult  Reason for Visit: Enteral Nutrition, Parenteral Nutrition    Pt well known to nutrition services from past admissions for parenteral nutrition support. Please see RD note 7/18 for details of nutrition course this admission to date.     Consult received for consideration of nutrition support. As discussed with Dr. Sherryll Burger, new acute indication for parenteral nutrition given minimal nutrition for at least 7 days with ongoing ileus. He reports discussing this with the patient and mother and explaining the likely short term (ie not home PN) need for PN until ileus resolves, however the timeline of this is TBD. She is currently on a regular diet but NGT in place/to suction. Reported wretching with NGT in place    Plan:   Can use current CVC for PN  DC D5 LR at 10pm when PN starts-PN volume will be significantly less than current IVF  Add phos on to daily labs and replace IV if hypophosphatemic  Make patient NPO  Full nutrition support team assessment with recommendations to follow early this week.    Arcola Jansky MS, RD, LDN, CNSC

## 2022-09-28 NOTE — Unmapped (Signed)
PARENTERAL NUTRITION CONSULTATION NOTE     Requesting Attending Physician :  Rocco Serene, MD    Reason for Consult:    [name] [name] is a 22 y.o. female seen in consultation at the request of Dr. Rocco Serene, MD for evaluation of  parenteral nutrition.    Chief Complaint:  PN indication: ileus  PN access: CVC R IJ (placed PTA; confirmed central on 09/26/22 CXR)  Duration of inadequate intake: 7 days    History of Present Illness:   [name] [name] is a 22 y.o. female that presented to [facility] with Other acute postprocedural pain.  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     Now with prolonged ileus requiring PN initiation.    Patient is well-known to the PN team from previous admissions for obstructive symptoms and previously discharged on HPN (1 day/week)    NUTRITION ASSESSMENT     Anthropometric Data:  Height: 165.1 cm (5' 5)   Admission weight: 62.4 kg (137 lb 9.6 oz)  Last recorded weight: 62.3 kg (137 lb 5.6 oz)  IBW: 56.75 kg  Percent IBW: 109.98 %  BMI: Body mass index is 22.86 kg/m??.   Usual Body Weight:  130-140 lbs per chart review    Weight history prior to admission:  130-140 lbs per chart review; stable    Wt Readings from Last 10 Encounters:   09/28/22 62.3 kg (137 lb 5.6 oz)   09/24/22 62.4 kg (137 lb 9.6 oz)   09/16/22 62 kg (136 lb 11.2 oz)   09/02/22 63 kg (139 lb)   08/05/22 62.1 kg (137 lb)   07/18/22 62.8 kg (138 lb 6.4 oz)   07/16/22 62.1 kg (137 lb)   07/14/22 62.9 kg (138 lb 9.6 oz)   06/15/22 63.6 kg (140 lb 3.4 oz)   06/06/22 60.3 kg (132 lb 14.4 oz)        Weight changes this admission:   Last 5 Recorded Weights    09/25/22 2342 09/27/22 0420 09/28/22 0912   Weight: 62.4 kg (137 lb 9.6 oz) 56.6 kg (124 lb 12.5 oz) 62.3 kg (137 lb 5.6 oz)        NUTRITIONALLY RELEVANT DATA   PN Start Date: 09/28/22    Parenteral Nutrition Formula:  Recent TPN Orders (Show up to 1 orders ; newest on the right.)       Start date and time  09/28/2022 2200       Parenteral Nutrition (CENTRAL) [[id number]]     Order Status Active     Frequency Continuous        Macro Ingredients    amino acid (CLINISOL SF) 15% 80 g     dextrose 150 g        Electrolytes    sodium chloride 75 mEq     potassium phosphate 15 mmol     magnesium sulfate dilution 8 mEq        Additives    multivitamin, adult injection 10 mL     trace elements (TRALEMENT) Zn-Cu-Mn-Se 1 mL        Medications    thiamine 180 mg        QS Base    sterile water 171.89 mL        Energy Contribution    Proteins 320 kcal     Dextrose 510 kcal     Lipids --     Total 830 kcal  Electrolyte Ion Calculated Amount    Sodium 75 mEq     Potassium 22 mEq     Calcium --     Magnesium 8 mEq     Aluminum --     Phosphate 15 mmol     Chloride 75 mEq     Acetate --     Chloride: Acetate Ratio --        Trace Elements    Copper 0.3 mg     Manganese 55 mcg     Selenium 60 mcg     Zinc 3 mg        Other    Total Amino Acid 80 g     Total Amino Acid/kg 1.28 g/kg     Glucose Infusion Rate 1.67 mg/kg/min     Osmolarity 1,752.74     Volume 960 mL     Rate 40 mL/hr     Dosing Weight 62.3 kg     Infusion Site Central     Total Multi-vitamins 10 mL        Admin Instructions      Please infuse using a parenteral nutrition tubing set (non-DEHP) with a 1.2-micron filter. Listed in Spray as Set IV for Parenteral Nutrition # [id number].  central line only               Labs:   Results in Past 7 Days  Result Component Current Result   Sodium 142 (09/28/2022)   Potassium 3.2 (L) (09/28/2022)   Chloride 109 (H) (09/28/2022)   CO2 30.0 (09/28/2022)   BUN <5 (L) (09/28/2022)   Creatinine 0.57 (09/28/2022)   Glucose 99 (09/28/2022)   Calcium 8.7 (09/28/2022)   Ionized Calcium Not in Time Range   Magnesium 2.0 (09/28/2022)   Phosphorus Not in Time Range   Albumin 3.1 (L) (09/28/2022)   Total Bilirubin 0.3 (09/28/2022)   Bilirubin, Direct Not in Time Range   AST 13 (09/28/2022)   ALT 14 (09/28/2022)   Alkaline Phosphatase 64 (09/28/2022)   Triglycerides Not in Time Range     Nutrition History:   September 28, 2022: Prior to admission:  per previous RD documentation, poor PO intake since Saturday prior to admission, now at 7days of inadequate nutrition    Allergies, Intolerances, Sensitivities, and/or Cultural/Religious Dietary Restrictions:  N/A    Current Nutrition:  NPO      GOALS and EVALUATION     Patient to meet 80% or greater of nutritional needs via parenteral nutrition within 3-5 days.  - New    Motivation, Barriers, and Compliance:  Evaluation of motivation, barriers, and compliance completed. No concerns identified at this time.     NUTRITION ASSESSMENT     Patient currently not meeting nutritional needs at this time.   Patient appropriate for parenteral nutrition based on the following ASPEN criteria of ileus.      Discharge Planning:   No discharge needs identified at this time.     Was the nutrition care plan completed? Yes      NUTRITION INTERVENTIONS and RECOMMENDATION     Initiate PN (amino acids 90 g, dextrose 150 g) in 960 mL infused over 24 hours daily 870 kcal.  HOLD lipids while on lidocaine infusion for pain due to potential to interfere with pain efficacy, based on patient previous experience.  Advance to goal as tolerated.  MIVF per primary team.    Follow-Up Parameters:   1-2 times per week (and more frequent as indicated)  Nutrition assessment completed by:  Meyer Russel, PharmD, BCPS, BCNSP, BCCCP  Pager [phone]     I was immediately available via phone/pager or present on site.  I reviewed the case, but did not see the patient.  I agree with the assessment and plan as documented in the note. Lauretta Grill, MD

## 2022-09-28 NOTE — Unmapped (Signed)
ICU TRANSPORT NOTE    Destination: CT    Departing Unit: MPCU  Pickup Time: 2125    Return Unit: [facility]PCU  Return Time: 2200    [facility] patient ID band verified  Allergies Reviewed  Code Status at time of transport: Full    Report received from primary nurse via SBARq. Handoff performed . Patient transported via stretcher under ICU level of care. See vital signs during transport via [employer]. O2 via [place] @ 2 L. Patient is alert, following commands, and orientedx4. Patient tolerated procedure/scan well. Universal, Aspiration, and Falkl  precautions maintained throughout transport.    Update and care given to primary nurse. See Doc Flowsheets/MAR for additional transportation documentation. Proper body mechanics and safe patient handling equipment were utilized throughout transport.

## 2022-09-28 NOTE — Unmapped (Signed)
Hospital Medicine Daily Progress Note    Assessment/Plan:    Principal Problem:    Other acute postprocedural pain  Active Problems:    [name]    Intestinal polyps    [name]    Neoplasm related pain    Physical deconditioning    History of colectomy    Intractable abdominal pain    Generalized anxiety disorder with panic attacks    SBO (small bowel obstruction) (CMS-HCC)    Small intestinal bacterial overgrowth (SIBO)  Resolved Problems:    * No resolved hospital problems. *                 Nathalie Cookie Schmid is a 22 y.o. female that presented to [facility] with Other acute postprocedural pain.  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    Diffuse muscle stiffness, increased tone  Recent anesthesia  History of desmoid tumors  Transient Lactic Acidosis, type B?  -Patient was undergoing IR guided procedure for desmoid tumors which included steroid injection under ultrasound guidance  -Afterwards developed severe pain and shaking/rigidity  -She was admitted due to uncontrolled pain and rigidity    Plan:  -Pain management with IV tylenol for 24 hours, oxycodone 15 mg every 4 hours and as needed Dilaudid 0.5 mg every 3 hours -- however complicated by ileus. Patient continues to endorse significant pain and inability to tolerated PO thus necessitating adequate pain medications.     -Discussing with Chronic Pain Anesthesia team regarding alternatives such as lidocaine or ketamine infusions if appropriate to limit use of opiates in setting of ileus and uncontrolled pain.     -Lactate was transient and now resolved -- possible elevated due to episode of muscle rigidity similarly in tonic clonic seizures although patient did not have signs of seizures and was alert during episodes. Concerned this is reaction to procedure but medication etiology unclear versus transient bowel obstruction/compression from ileus?    -Due to chills and lactic acidosis, broad spectrum antibiotics were started however she was narrowed to ceftriaxone today after MRSA nares negative and no other signs of infection on objective data and clinically. If she does have not culture data showing infection after 48 hours -- will consider stopping ceftriaxone entirely and monitoring clinically.     -Follow-up on blood cultures and urine culture -- currently no growth    -Discussed with neurology, MRI spine returned unrevealing. Pending paraneoplastic panel. Recommendations for adjusting muscle relaxants as appropriate.     Ileus  -Seen on CT as above  -CT with oral contrast negative for bowel obstruction  -KUB today with persistent ileus, obtain KUB daily  -NG tube to low intermittent suction  -IV to PO medications as she improves  -IVF maintenance for now with D5LR  -Nutrition consult for peripheral nutrition  -Discussing with Chronic pain management team as above to see if alternatives can be used to opiates  -Discussing with Gastroenterology on if advanced therapies are needed for ileus    Coffee Ground Emesis  -Noted in NG tube this morning  -Hgb stable 10.1 with no hemodynamic instability  -She is on IV PPI BID and has history of ulcers  -Discussing with Gastroenterology if further workup such as EGD is appropriate at this time however in context of possible anesthesia related reaction -- may complicate intervention  -Type and screen, repeat CBC this afternoon  -No chemical DVT ppx, only SCDs    [name]  -  Currently holding home nirogacest after discussion with hematology and oncology/solid tumor teamat     Small intestinal bacterial overgrowth  -History of colectomy  -Holding home cefdinir and doxycycline for SIBO in setting of broad-spectrum antibiotics    Prophylaxis  -Ambulation     Diet  -Nutrition Therapy Regular/House     Code Status / HCDM   -Full Code, Unverified but presumed, based on previous history   -  HCDM [facility] policy, no known patient preference): Widjaja,[name] - Mother - [phone]    HCDM, back-up (If primary HCDM is unavailable): Ahlgrim,Jeff - Father - [phone]      I personally spent 65 minutes face-to-face and non-face-to-face in the care of this patient, which includes all pre, intra, and post visit time on the date of service.  All documented time was specific to the E/M visit and does not include any procedures that may have been performed.    ___________________________________________________________________    Subjective:  Patient having nausea and vomiting still. She had episode of coffee ground emesis overnight, no bowel movements however. Denies chest pain or dyspnea.       Labs/Studies:  Labs and Studies from the last 24hrs per EMR and Reviewed    Objective:  Temp:  [36.7 ??C (98.1 ??F)-36.9 ??C (98.5 ??F)] 36.9 ??C (98.5 ??F)  Heart Rate:  [64-83] 80  SpO2 Pulse:  [65-81] 75  Resp:  [9-20] 15  BP: (102-126)/(51-70) 123/61  SpO2:  [98 %-100 %] 98 %    Cardiovascular: Regular rhythm, tachycardic, no murmurs  Lungs: No wheezes, no rales, no rhonchi  Neurological: no myoclonus, no flexural posturing, no hyperreflexia  Abdomen: Tender to palpation in all quadrants, no rebounding or guarding

## 2022-09-28 NOTE — Unmapped (Signed)
=  Department of Anesthesiology  Pain Medicine Division    Chronic Pain Consult Note      Requesting Attending Physician:  Rocco Serene, MD  Service Requesting Consult:  [facility])    Assessment/Recommendations:  The patient was seen in consultation on request of Rocco Serene, MD regarding assistance with pain management for patient's chronic intractable abdominal pain with now acute on chronic recurrent ileus/NG tube placement.  Primary team requesting consideration for ketamine infusion or lidocaine infusion, to reduce narcotic usage due to concern for narcotic bowel syndrome.    The patient is a 22 y.o. female who has history of dermoid tumors presented to the hospital for outpatient IR guided steroid injections under ultrasound guidance.  She had a general anesthetic with propofol infusion and native airway.  After which patient started having severe pain shaking and rigidity in her extremities, leading to elevation of her lactic acid.  Subsequently admitted for observation and then admission.  Underwent consultation with neurology along with MRI C/T/L-spine on 09/26/22 which ruled out any spinal cord lesion.  Overall patient's shakiness rigidity improved and she participated in PT ambulation yesterday with which she still had some shakiness and rigidity.  However her stay then complicated by ileus confirmed on CT scan with oral contrast of abdomen and pelvis done 09/27/2022.  No bowel obstruction per surgery team, who has since signed off.  Patient continues to have complaint of nausea but no further vomiting, but has an NG in place.  At some point this morning (7/21) there was a concern for coffee-ground emesis which has not continued afterwards.    Patient currently established with [facility] pain management center Dr. Dyann Ruddle, has been on oxycodone 10 mg 3 times daily, Suboxone 0.5 mg 3 times daily along with baclofen and pregabalin.  Through her psychiatrist she is on Ativan 0.5 mg 3 times daily as needed.    She has complex, chronic abdominal pain related to her previous surgeries, with a likely large component of visceral hypersensitivity and possibly opioid induced hyperalgesia.  Patient previously had an attempt of celiac plexus block that did not help.  Patient has had multiple prior inpatient stays either regarding acute exacerbation of chronic pain or ileus.  In the past 12 months she has had 2 infusions of ketamine with limited benefit and at one point there was some transaminitis which was thought to be attributed from other etiologies like TPN versus other intra-abdominal pathologies.  In the past patient also has had lidocaine infusions during inpatient stay with limited/variable benefit.  In our opinion those infusions have not necessarily helped shorten the duration of the stay of the hospital or help her wean opioids, so while they may help stabilize her pain, they do not seem to change the overall trajectory at all.      However in the current situation after consulting primary admitting doctor Dr. Sherryll Burger who was in fact present during our conversation with the patient as well as the mother at the bedside we came to conclusion to attempt trial of lidocaine infusion for 2 days while monitoring her LFTs.  We did discuss with patient and mother at length about very limited options for treatment in her situation because they are often met with side effects and challenges in the past.  The patient was hopeful for a ketamine infusion, but she has had it as recently as May, so ideally we can avoid it for the time being.  In the future for outpatient we would recommend that patient follows up with psychology/psychiatry service regarding her chronic intractable pain to work on behavioral modification, coping mechanism with overall team to reduce dependence on opiates which seems to be making things worse for the patient.  Her uncontrolled anxiety is no doubt playing a role as well. Unfortunately, during our initial conversation, she felt like we were assuming that she was admitted again for pain (which we hadnt insinuated) but she wanted to make sure we knew that the admission was not because of pain and that she would prefer not to be here.  We did go back in the room to apologize for any confusion and that we were aware there were other things going on that led to the hospitalization.      Because of coffee ground emesis - can't use NSAIDs. Also some GIB in the past as well     Regarding ileus and her constipation which is likely related to opiate induced constipation she has tried Relistor injection, which she did not tolerate at all.  Patient does not want to try that again.  She has tried MiraLAX magnesium oxide with significant side effect as well.  Of note patient has J-pouch s/p colectomy that leads to chronic loose stool at baseline up to 5-6 times a day.  After discussion with the patient and mother we came to an agreement to attempt with a low-dose of naloxegol 12.5 mg, she has taken enterag around surgery time before and seemed to tolerate it.  And seem like Dr. Sherryll Burger was in agreement with attempt. Dr. Sherryll Burger also mentioned that he was going to consult with the GI team whether neostigmine/other drugs would be appropriate choice in patient's situation.  I would expect the naloxegol to increase her abdominal pain at first but hope that we can continue it and even increase to 25mg  if able.    This is no doubt a very complex situation.  The patient blames her anesthetic for the rigidity afterwards that led to her lactic acid elevation and hospitalization.  The procedure she had seemed to be a very minor procedure, so I would highly recommend trying to avoid general anesthesia unless it is absolutely necessary.  Her biggest issue at this point seems to be the ileus.  She does complain of severe pain now, but I would urge caution using significant opioid trying to treat this.  THis is only going to help temporarily and make things worse in the long run.  THe patient doesn't believe she's had long term issues related to opioids and bowel transit in the past given her frequency of bowel movements, but I would not be surprised if she has some aspect of OIC chronically.  I plan on talking to [name] this coming week (its Sunday today), to discuss a long term plan, ultimately up to him.   She needs to continue to work with her psychiatrist, pain psychology, and any other mental health providers to better improve her anxiety and help her with pain coping skills and other things that are known to be helpful for chronic visceral pain.  Medication management should focus on non-opioid medications, although she has trialed others in past with side effects or minimal benefit.  Caution should be urged whenever she is admitted to the hosptial not to start or escalate opioids unless absolutely clinically indicated.  She has had several long hospitalizations this year with significant work ups, and I worry that reliance on IV  opioids or something such as IV ketamine will only prolong her hospitalization.  Hopefully minimizing opioids and working on her ileus will help get her out of the hospital quicker than previously.      Ultimately, we will proceed with a short course of lidocaine infusion, and hopefully she will be able to get her NG out and transition to an oral regimen.  I would wean and discontinue IV opioids as soon as absoltuely possible      Recommendations:  -The chronic pain service is a consult service and does not place orders, just makes recommendations (except ketamine and lidocaine infusions)  -Please evaluate all patients on opioids for appropriateness of prescribing narcan at discharge.  The chronic pain service can assist with this.  Nasal narcan covered by most insurance.  -Recommendations given apply to the current hospitalization and do not reflect long term recommendations.    Recommend to start lidocaine infusion.  Please follow recommendation as follow  -Patient is on a low dose lidocaine infusion for pain   -All lidocaine orders will be managed by the [facility] Chronic Pain Service only   -Lidocaine requires dedicated IV (PIV or central line lumen)   -Start lidocaine infusion at 1mg /kg/hr (ideal body weight)   -Patient location: dose can be increased no sooner than every 4 hours   -Day 1 (max-2 days trial). Started (date): 09/28/22   -Prior to starting: normal recent EKG 09/26/22 - normal, Reviewed 09/28/22 ( normal creatinine, LFTs), urine pregnancy test negative 09/26/22   -Daily CMP (creatinine and LFTs daily) while running   -Lipid emulsion available PRN: Local Anesthetic Systemic Toxicity   -Please contact the Chronic Pain service with any questions or concerns about lidocaine infusion.     Recommend to try naloxegol 12.5 mg p.o. daily.  I expect if tis helpful it may increase discomfort/pain at first, but hopefully will be helpful and safe. Alternately can follow recommendation by GI service for other alternative treatment for opiate induced constipation.    Recommend to Discontinue dilaudid IV/other IV opioids or at least limit as much as possible.      -Restart home meds whenever able.  Could schedule baclofen and increase pregabalin.    We will continue to follow.      Naloxone Rx at discharge?  Is patient on opioids? Yes.  1)Is dose >50MME?  Yes.  2) Is patient prescribed a benzodiazepine (w opioids)? Yes.  3)Hx of overdose?  No.  4) Hx of substance use disorder? No.  5) Opioids likely to last greater than a week after discharge? Yes.    If yes to 2 or more, prescribe naloxone at discharge.  Nasal narcan for most insured (Nasal narcan 4mg /actuation, prescribe 1 kit, instructions at [email or web address]).  For uninsured, chronic pain can work to assist in finding an option.  OTC nasal narcan now available at most pharmacies for around $45.    History of Present Illness:    Reason for Consult: Acute on chronic abdominal pain, ileus, consideration of ketamine versus lidocaine infusion    Patient reported that since her recent egg retrieval procedure and recent start of chemotherapy, leading up to the right upper extremity scheduled outpatient injection patient was feeling slightly nauseated somewhat abdominal pain.  She had an appointment with [name] at the end of June after egg retrieval, with increase in pain and increase in oxycodone use.  SHe notes she was able to decrease oxycodoen use leading up to this hospitalization.  But since the procedure and  during inpatient stay things have gotten out of control.  She still feels stiffness in the lower extremity however have improved.  Not much stiffness in upper extremity.  No new focal deficit.  No fever.  No vomiting but continues to have nausea today.      Analgesia Evaluation:  Pain at minimum: 5/10  Pain at maximum: 10/10    Prior to admission, the patient was on home opiate medications.  Shehas been followed by a pain clinic.  The  CSRS was reviewed.    Allergies    Allergies   Allergen Reactions    Adhesive Tape-Silicones Hives and Rash     Paper tape  And tegederm ok    Ferrlecit [Sodium Ferric Gluconat-Sucrose] Swelling and Rash    Levofloxacin Swelling and Rash     Swelling in mouth, rash,     Methylnaltrexone      Per Patient: I lost bowel control, severe abdominal cramping, and elevated BP    Neomycin Swelling     Rxn after ear drops; ear swelling    Papaya Hives    Morphine Nausea And Vomiting    Zosyn [Piperacillin-Tazobactam] Itching and Rash     Red and itchy    Compazine [Prochlorperazine] Other (See Comments)     Extreme agitation    Iron Analogues     Iron Dextran Itching     Received iron dextran 06/08/22 over 12 hours, had itching and redness/flushing during the infusion and for a couple days after. Required IV benadryl w/flares between doses and ultimately treated w/IV methylpred for 2 days.     Latex, Natural Rubber Rash       Home Medications    Medications Prior to Admission   Medication Sig Dispense Refill Last Dose    buprenorphine-naloxone (SUBOXONE) 2-0.5 mg sublingual film Place 0.25 Film (0.5 mg of buprenorphine total) under the tongue three (3) times a day. Fill on or after: 09/05/22 22 Film 0 09/25/2022    acetaminophen (TYLENOL) 500 MG tablet Take 2 tablets (1,000 mg total) by mouth every eight (8) hours.       ammonium lactate (LAC-HYDRIN) 12 % lotion Apply 1 Application topically two (2) times a day.       baclofen (LIORESAL) 5 mg Tab tablet Take 1 tablet (5 mg total) by mouth Three (3) times a day as needed for muscle spasms. 30 tablet 0     [EXPIRED] bisacodyl (DULCOLAX) 10 mg suppository Unwrap then insert 1 suppository (10 mg total) into the rectum daily as needed. 12 suppository 0     cefdinir (OMNICEF) 300 MG capsule Take 1 capsule (300mg ) twice a day on Mon, Wed, Friday       cholecalciferol, vitamin D3 25 mcg, 1,000 units,, 1,000 unit (25 mcg) tablet Take 1 tablet (25 mcg total) by mouth daily. 100 tablet 3     clobetasoL (TEMOVATE) 0.05 % ointment Apply topically two (2) times a day. To stubborn/thick rashes on hands/feet ears until clear/smooth. Restart as needed 60 g 3     diclofenac sodium (VOLTAREN) 1 % gel Apply 2 g topically four (4) times a day as needed for pain.       doxycycline (VIBRA-TABS) 100 MG tablet Take 1 tablets by mouth twice a day on Tuesday, Thursday, Saturday, Sunday. 28 tablet 0     famotidine (PEPCID) 20 MG tablet Take 1 tablet (20 mg total) by mouth nightly. 90 tablet 3     fluticasone propionate (FLONASE) 50 mcg/actuation nasal spray  1 spray on skin prior to application of butrans patch to reduce irritation and itching 16 g 0     hydrocortisone (ANUSOL-HC) 2.5 % rectal cream Insert into the rectum two (2) times a day as needed.       lidocaine 4 % patch Place 2 patches on the skin daily for 7 days. Apply to affected area for 12 hours, then remove for 12 hours. 15 patch 0     [EXPIRED] LORazepam (ATIVAN) 0.5 MG tablet Take 1 tablet (0.5 mg total) by mouth Three (3) times a day as needed for anxiety. 20 tablet 0     LORazepam (ATIVAN) 0.5 MG tablet Take 1 tablet (0.5 mg total) by mouth Three (3) times a day as needed for anxiety. 270 tablet 2     mirtazapine (REMERON) 15 MG tablet Take  1 tablet by mouth (15mg ) at night with 7.5mg  tablet to make 22.5mg  nightly 30 tablet 0     mirtazapine (REMERON) 7.5 MG tablet Take 1 tablet by mouth (7.5mg ) with 15mg  tab to make 22.5mg  daily 30 tablet 0     naloxone (NARCAN) 4 mg nasal spray One spray in either nostril once for known/suspected opioid overdose. May repeat every 2-3 minutes in alternating nostril til EMS arrives 2 each 0     nirogacestat (OGSIVEO) 50 mg tablet Take 3 tablets (150 mg total) by mouth two (2) times a day. Swallow tablets whole; do not break, crush, or chew. 180 tablet 0     [EXPIRED] ondansetron (ZOFRAN-ODT) 4 MG disintegrating tablet Dissolve 1 tablet (4 mg total) by mouth every six (6) hours as needed. 20 tablet 0     oxyCODONE (ROXICODONE) 10 mg immediate release tablet Take 1 tablet (10 mg total) by mouth Three (3) times a day as needed for pain. Fill on or after: 09/08/22 90 tablet 0     [EXPIRED] pantoprazole (PROTONIX) 40 MG tablet Take 1 tablet (40 mg total) by mouth daily. 30 tablet 0     pimecrolimus (ELIDEL) 1 % cream Apply topically two (2) times a day as needed. To face as needed for rash       [EXPIRED] promethazine (PHENERGAN) 12.5 MG tablet Take 1 tablet (12.5 mg total) by mouth every six (6) hours as needed. 20 tablet 0     triamcinolone (KENALOG) 0.1 % cream Apply topically two (2) times a day as needed. Apply to rash as needed       zinc oxide 10 % Crea Apply 1 application. topically daily as needed.          Inpatient Medications  Current Facility-Administered Medications   Medication Dose Route Frequency Provider Last Rate Last Admin    baclofen (LIORESAL) tablet 10 mg  10 mg Oral TID PRN Rocco Serene, MD   10 mg at 09/27/22 1639    buprenorphine-naloxone (SUBOXONE) 2-0.5 mg SL film 0.5 mg of buprenorphine  0.25 Film Sublingual TID Terri Piedra, AGNP   0.5 mg of buprenorphine at 09/28/22 1033    calcium carbonate (TUMS) chewable tablet 400 mg elem calcium  400 mg elem calcium Oral Daily PRN Terri Piedra, AGNP        [Provider Hold] cefdinir (OMNICEF) capsule 300 mg  300 mg Oral Q12H [facility] Rocco Serene, MD        cefTRIAXone (ROCEPHIN) 2 g in sodium chloride 0.9 % (NS) 100 mL IVPB-MBP  2 g Intravenous Q24H Rocco Serene, MD   Stopped at 09/28/22 [phone]  dextrose 5 % in lactated ringers infusion  100 mL/hr Intravenous Continuous Rocco Serene, MD        [Provider Hold] doxycycline (VIBRA-TABS) tablet 100 mg  100 mg Oral BID Rocco Serene, MD        famotidine (PF) (PEPCID) injection 20 mg  20 mg Intravenous At bedtime Rocco Serene, MD        fat emulsion 20 % (INTRAlipid) infusion 100 mL  100 mL Intravenous Once PRN Kareema Keitt, Ammie Ferrier, MD        fat emulsion 20 % (INTRAlipid) infusion 200 mL  200 mL Intravenous Once PRN Jakye Mullens, Ammie Ferrier, MD        Parenteral Nutrition (CENTRAL)   Intravenous Continuous Rocco Serene, MD        And    fat emulsion 20 % with fish oil (SMOFlipid) infusion 250 mL  250 mL Intravenous Continuous Rocco Serene, MD        HYDROmorphone (PF) injection Syrg 0.5 mg  0.5 mg Intravenous Q3H PRN Rocco Serene, MD   0.5 mg at 09/28/22 1130    lactated Ringers infusion  10 mL/hr Intravenous Continuous Anja Neuzil, Ammie Ferrier, MD        lidocaine 2000 mg in dextrose 5 % 250 mL (8 mg/mL) infusion PMB  1 mg/kg/hr (Ideal) Intravenous Continuous Neelie Welshans, Ammie Ferrier, MD        LORazepam (ATIVAN) tablet 0.5 mg  0.5 mg Oral Q6H PRN Rocco Serene, MD        melatonin tablet 3 mg  3 mg Oral Nightly PRN Terri Piedra, AGNP        mirtazapine (REMERON) tablet 15 mg  15 mg Oral Nightly Terri Piedra, AGNP   15 mg at 09/26/22 2316    ondansetron (ZOFRAN) injection 4 mg  4 mg Intravenous Q6H PRN Terri Piedra, AGNP   4 mg at 09/28/22 4034    oxyCODONE (ROXICODONE) immediate release tablet 15 mg  15 mg Oral Q4H PRN Terri Piedra, AGNP   15 mg at 09/27/22 1410    pantoprazole (Protonix) injection 40 mg  40 mg Intravenous BID Rocco Serene, MD   40 mg at 09/28/22 0803    phenol (CHLORASEPTIC) 1.4 % spray 2 spray  2 spray Mucous Membrane Q2H PRN Rocco Serene, MD   2 spray at 09/27/22 1221    polyethylene glycol (MIRALAX) packet 17 g  17 g Oral Daily PRN Terri Piedra, AGNP        potassium chloride 10 mEq in 100 mL IVPB  10 mEq Intravenous Once Rocco Serene, MD 100 mL/hr at 09/28/22 1059 10 mEq at 09/28/22 1059    Followed by    potassium chloride 10 mEq in 100 mL IVPB  10 mEq Intravenous Once Rocco Serene, MD        promethazine [facility]) injection 6.25 mg  6.25 mg Intravenous Q6H PRN Rocco Serene, MD   6.25 mg at 09/28/22 1128         Past Medical History    Past Medical History:   Diagnosis Date    Abdominal pain     Acid reflux     occas    Anesthesia complication     itching, shaking, coldness; last few surgeries have gone much better    Cancer (CMS-HCC)     Cataract of right eye     COVID-19 virus infection 01/2019    Cyst of  thyroid determined by ultrasound     monitoring    [name]     2 right forearm, 1 left thigh, 1 right scapula, 1 under left clavicle; multiple    Difficult intravenous access     [name] (familial adenomatous polyposis)     [name]     Gastric polyps     History of chemotherapy     last treatment approx 05/2019    History of colon polyps     History of COVID-19 01/2019    Iron deficiency anemia due to chronic blood loss     received iron infusion 11-2019    PONV (postoperative nausea and vomiting)     Rectal bleeding     Syncopal episodes     especially if becoming dehydrated           Past Surgical History    Past Surgical History: Procedure Laterality Date    COLON SURGERY      cyroablation      cystis removal      desmoid removal      PR CLOSE ENTEROSTOMY,RESEC+ANAST N/A 10/09/2020    Procedure: ILEOSTOMY TAKEDOWN;  Surgeon: Mickle Asper, MD;  Location: OR [place];  Service: General Surgery    PR COLONOSCOPY W/BIOPSY SINGLE/MULTIPLE N/A 10/27/2012    Procedure: COLONOSCOPY, FLEXIBLE, PROXIMAL TO SPLENIC FLEXURE; WITH BIOPSY, SINGLE OR MULTIPLE;  Surgeon: Shirlyn Goltz Mir, MD;  Location: PEDS PROCEDURE ROOM [place];  Service: Gastroenterology    PR COLONOSCOPY W/BIOPSY SINGLE/MULTIPLE N/A 09/14/2013    Procedure: COLONOSCOPY, FLEXIBLE, PROXIMAL TO SPLENIC FLEXURE; WITH BIOPSY, SINGLE OR MULTIPLE;  Surgeon: Shirlyn Goltz Mir, MD;  Location: PEDS PROCEDURE ROOM [place];  Service: Gastroenterology    PR COLONOSCOPY W/BIOPSY SINGLE/MULTIPLE N/A 11/08/2014    Procedure: COLONOSCOPY, FLEXIBLE, PROXIMAL TO SPLENIC FLEXURE; WITH BIOPSY, SINGLE OR MULTIPLE;  Surgeon: Arnold Long Mir, MD;  Location: PEDS PROCEDURE ROOM [facility];  Service: Gastroenterology    PR COLONOSCOPY W/BIOPSY SINGLE/MULTIPLE N/A 12/26/2015    Procedure: COLONOSCOPY, FLEXIBLE, PROXIMAL TO SPLENIC FLEXURE; WITH BIOPSY, SINGLE OR MULTIPLE;  Surgeon: Arnold Long Mir, MD;  Location: PEDS PROCEDURE ROOM [facility];  Service: Gastroenterology    PR COLONOSCOPY W/BIOPSY SINGLE/MULTIPLE N/A 09/02/2017    Procedure: COLONOSCOPY, FLEXIBLE, PROXIMAL TO SPLENIC FLEXURE; WITH BIOPSY, SINGLE OR MULTIPLE;  Surgeon: Arnold Long Mir, MD;  Location: PEDS PROCEDURE ROOM [place];  Service: Gastroenterology    PR COLSC FLX W/REMOVAL LESION BY HOT BX FORCEPS N/A 08/27/2016    Procedure: COLONOSCOPY, FLEXIBLE, PROXIMAL TO SPLENIC FLEXURE; W/REMOVAL TUMOR/POLYP/OTHER LESION, HOT BX FORCEP/CAUTE;  Surgeon: Arnold Long Mir, MD;  Location: PEDS PROCEDURE ROOM [facility]  Service: Gastroenterology    PR COLSC FLX W/RMVL OF TUMOR POLYP LESION SNARE TQ N/A 02/25/2019    Procedure: COLONOSCOPY FLEX; W/REMOV TUMOR/LES BY SNARE; Surgeon: Helyn Numbers, MD;  Location: GI PROCEDURES [place] [facility];  Service: Gastroenterology    PR COLSC FLX W/RMVL OF TUMOR POLYP LESION SNARE TQ N/A 03/13/2020    Procedure: COLONOSCOPY FLEX; W/REMOV TUMOR/LES BY SNARE;  Surgeon: Helyn Numbers, MD;  Location: GI PROCEDURES [place] [facility];  Service: Gastroenterology    PR EXC SKIN BENIG 2.1-3 CM TRUNK,ARM,LEG Right 02/25/2017    Procedure: EXCISION, BENIGN LESION INCLUDE MARGINS, EXCEPT SKIN TAG, LEGS; EXCISED DIAMETER 2.1 TO 3.0 CM;  Surgeon: Clarene Duke, MD;  Location: CHILDRENS OR [facility];  Service: Plastics    PR EXC SKIN BENIG 3.1-4 CM TRUNK,ARM,LEG Right 02/25/2017    Procedure: EXCISION, BENIGN LESION INCLUDE MARGINS, EXCEPT SKIN TAG,  ARMS; EXCISED DIAMETER 3.1 TO 4.0 CM;  Surgeon: Clarene Duke, MD;  Location: CHILDRENS OR [facility];  Service: Plastics    PR EXC SKIN BENIG >4 CM FACE,FACIAL Right 02/25/2017    Procedure: EXCISION, OTHER BENIGN LES INCLUD MARGIN, FACE/EARS/EYELIDS/NOSE/LIPS/MUCOUS MEMBRANE; EXCISED DIAM >4.0 CM;  Surgeon: Clarene Duke, MD;  Location: CHILDRENS OR [facility];  Service: Plastics    PR EXC TUMOR SOFT TISSUE LEG/ANKLE SUBQ 3+CM Right 08/05/2019    Procedure: EXCISION, TUMOR, SOFT TISSUE OF LEG OR ANKLE AREA, SUBCUTANEOUS; 3 CM OR GREATER;  Surgeon: Arsenio Katz, MD;  Location: MAIN OR [place];  Service: Plastics    PR EXC TUMOR SOFT TISSUE LEG/ANKLE SUBQ <3CM Right 08/05/2019    Procedure: EXCISION, TUMOR, SOFT TISSUE OF LEG OR ANKLE AREA, SUBCUTANEOUS; LESS THAN 3 CM;  Surgeon: Arsenio Katz, MD;  Location: MAIN [facility];  Service: Plastics    PR LAP, SURG PROCTECTOMY W J-POUCH N/A 08/10/2020    Procedure: ROBOTIC ASSISTED LAPAROSCOPIC PROCTOCOLECTOMY, ILEAL J POUCH, WITH OSTOMY;  Surgeon: Mickle Asper, MD;  Location: OR ;  Service: General Surgery    PR NDSC EVAL INTSTINAL POUCH DX W/COLLJ SPEC SPX N/A 01/23/2021    Procedure: ENDO EVAL SM INTEST POUCH; DX;  Surgeon: Modena Nunnery, MD;  Location: GI PROCEDURES [place] [facility];  Service: Gastroenterology    PR NDSC EVAL INTSTINAL POUCH DX W/COLLJ SPEC SPX N/A 08/27/2021    Procedure: ENDO EVAL SM INTEST POUCH; DX;  Surgeon: Hunt Oris, MD;  [facility]: [facility];  Service: Gastroenterology    PR NDSC EVAL INTSTINAL POUCH DX W/COLLJ SPEC SPX N/A 12/09/2021    Procedure: ENDO EVAL SM INTEST POUCH; DX;  Surgeon: Vidal Schwalbe, MD;  Location: [facility];  Service: Gastroenterology    PR NDSC EVAL INTSTINAL POUCH DX W/COLLJ [facility] SPX Left 04/09/2022    Procedure: ENDO EVAL SM INTEST POUCH; DX;  Surgeon: Modena Nunnery, MD;  Location: [facility] [place] [facility];  Service: Gastroenterology    PR NDSC EVAL INTSTINAL POUCH DX W/COLLJ SPEC SPX N/A 08/05/2022    Procedure: ENDO EVAL SM INTEST POUCH; DX;  Surgeon: Modena Nunnery, MD;  Location: [facility];  Service: Gastroenterology    PR NDSC EVAL INTSTINAL POUCH W/BX SINGLE/MULTIPLE N/A 01/20/2022    Procedure: ENDOSCOPIC EVAL OF SMALL INTESTINAL POUCH; DIAGNOSTIC, No biopsies;  Surgeon: Andrey Farmer, MD;  Location: [facility]URES [facility];  Service: Gastroenterology    PR NDSC EVAL INTSTINAL POUCH W/BX SINGLE/MULTIPLE N/A 02/13/2022    Procedure: ENDOSCOPIC EVAL OF SMALL INTESTINAL POUCH; DIAGNOSTIC, WITH BIOPSY;  Surgeon: Bronson Curb, MD;  Location: [facility]URES [facility];  Service: Gastroenterology    PR UNLISTED PROCEDURE SMALL INTESTINE  01/23/2021    Procedure: UNLISTED PROCEDURE, SMALL INTESTINE;  Surgeon: Modena Nunnery, MD;  Location: [facility] [place] [facility];  Service: Gastroenterology    PR UNLISTED PROCEDURE SMALL INTESTINE  02/13/2022    Procedure: UNLISTED PROCEDURE, SMALL INTESTINE;  Surgeon: Bronson Curb, MD;  Location: [facility];  Service: Gastroenterology    PR UPPER GI ENDOSCOPY,BIOPSY N/A 10/27/2012    Procedure: UGI ENDOSCOPY; WITH BIOPSY, SINGLE OR MULTIPLE;  Surgeon: Shirlyn Goltz Mir, MD;  Location: PEDS PROCEDURE ROOM [facility];  Service: Gastroenterology    PR UPPER GI ENDOSCOPY,BIOPSY N/A 09/14/2013    Procedure: UGI ENDOSCOPY; WITH BIOPSY, SINGLE OR MULTIPLE;  Surgeon: Shirlyn Goltz Mir, MD;  Location: PEDS PROCEDURE ROOM [place];  Service: Gastroenterology    PR UPPER GI ENDOSCOPY,BIOPSY N/A 11/08/2014    Procedure: UGI ENDOSCOPY; WITH BIOPSY, SINGLE OR MULTIPLE;  Surgeon: Arnold Long Mir, MD;  Location: PEDS PROCEDURE ROOM [facility];  Service: Gastroenterology    PR UPPER GI ENDOSCOPY,BIOPSY N/A 12/26/2015    Procedure: UGI ENDOSCOPY; WITH BIOPSY, SINGLE OR MULTIPLE;  Surgeon: Arnold Long Mir, MD;  Location: PEDS PROCEDURE ROOM [facility];  Service: Gastroenterology    PR UPPER GI ENDOSCOPY,BIOPSY N/A 08/27/2016    Procedure: UGI ENDOSCOPY; WITH BIOPSY, SINGLE OR MULTIPLE;  Surgeon: Arnold Long Mir, MD;  Location: PEDS PROCEDURE ROOM [facility];  Service: Gastroenterology    PR UPPER GI ENDOSCOPY,BIOPSY N/A 09/02/2017    Procedure: UGI ENDOSCOPY; WITH BIOPSY, SINGLE OR MULTIPLE;  Surgeon: Arnold Long Mir, MD;  Location: PEDS PROCEDURE ROOM [facility];  Service: Gastroenterology    PR UPPER GI ENDOSCOPY,BIOPSY N/A 03/13/2020    Procedure: UGI ENDOSCOPY; WITH BIOPSY, SINGLE OR MULTIPLE;  Surgeon: Helyn Numbers, MD;  Location: [facility] [place] [facility];  Service: Gastroenterology    PR UPPER GI ENDOSCOPY,BIOPSY N/A 09/05/2021    Procedure: UGI ENDOSCOPY; WITH BIOPSY, SINGLE OR MULTIPLE;  Surgeon: Wendall Papa, MD;  Location: [facility] PROC[facility]URES [facility];  Service: Gastroenterology    PR UPPER GI ENDOSCOPY,DIAGNOSIS N/A 01/20/2022    Procedure: UGI ENDO, INCLUDE ESOPHAGUS, STOMACH, & DUODENUM &/OR JEJUNUM; DX W/WO COLLECTION SPECIMN, BY BRUSH OR WASH;  Surgeon: Andrey Farmer, MD;  Location: GI PROC[facility]URES [facility];  Service: Gastroenterology    TUMOR REMOVAL      multiple-head, neck, back, hand, right flank, multiple           Family History    Family History   Problem Relation Age of Onset    No Known Problems Mother     No Known Problems Father     No Known Problems Sister     No Known Problems Brother     Stroke Maternal Grandmother     Other Maternal Grandmother         benign lesions of liver and pancreas, further details unknown    Cancer Maternal Grandmother     Diabetes Maternal Grandmother     Hypertension Maternal Grandmother     Thyroid disease Maternal Grandmother     Arthritis Maternal Grandfather     Asthma Maternal Grandfather     COPD Paternal Grandmother         Deceased    Miscarriages / Stillbirths Paternal Grandmother     Alcohol abuse Paternal Grandfather         Deceased    No Known Problems Maternal Aunt     No Known Problems Maternal Uncle     No Known Problems Paternal Aunt     No Known Problems Paternal Uncle     Anesthesia problems Neg Hx     Broken bones Neg Hx     Cancer Neg Hx     Clotting disorder Neg Hx     Collagen disease Neg Hx     Diabetes Neg Hx     Dislocations Neg Hx     Fibromyalgia Neg Hx     Gout Neg Hx     Hemophilia Neg Hx     Osteoporosis Neg Hx     Rheumatologic disease Neg Hx     Scoliosis Neg Hx     Severe sprains Neg Hx     Sickle cell anemia Neg Hx     Spinal Compression Fracture Neg Hx  Melanoma Neg Hx     Basal cell carcinoma Neg Hx     Squamous cell carcinoma Neg Hx          Social History:  Social History     Tobacco Use   Smoking Status Never    Passive exposure: Past   Smokeless Tobacco Never     Social History     Substance and Sexual Activity   Alcohol Use Never     Social History     Substance and Sexual Activity   Drug Use Never       Imaging/Labs  CT A P w contrast 09/27/22  Impression   Similar-appearing bowel pattern with multiple dilated air and fluid containing bowel loops without a discrete transition point which may be indicative of ileus.      Otherwise stable abdominopelvic findings compared to 09/26/2022.       Spine MRI 09/26/22  Impression   --No acute findings in the cervical, thoracic or lumbar spine.      --Mild degenerative changes in the cervical spine.       Lab Results   Component Value Date    CREATININE 0.57 09/28/2022       Lab Results   Component Value Date    ALKPHOS 64 09/28/2022    BILITOT 0.3 09/28/2022    BILIDIR <0.10 08/08/2022    PROT 5.7 09/28/2022    ALBUMIN 3.1 (L) 09/28/2022    ALT 14 09/28/2022    AST 13 09/28/2022       Encounter Date: 09/25/22   ECG 12 Lead   Result Value    EKG Systolic BP     EKG Diastolic BP     EKG Ventricular Rate 81    EKG Atrial Rate 81    EKG P-R Interval 144    EKG QRS Duration 84    EKG Q-T Interval 394    EKG QTC Calculation 457    EKG Calculated P Axis 55    EKG Calculated R Axis 30    EKG Calculated T Axis 12    QTC Fredericia 435    Narrative    NORMAL SINUS RHYTHM  NORMAL ECG  WHEN COMPARED WITH ECG OF 14-Aug-2022 12:03,  NO SIGNIFICANT CHANGE WAS FOUND  Confirmed by Eldred Manges [phone]) on 09/26/2022 11:41:24 PM       No results found for requested labs within last 2 days.       Objective:     Vital Signs  Temp:  [36.7 ??C (98.1 ??F)-36.9 ??C (98.5 ??F)] 36.9 ??C (98.5 ??F)  Heart Rate:  [64-83] 80  SpO2 Pulse:  [65-81] 75  Resp:  [9-20] 15  BP: (102-126)/(51-70) 123/61  MAP (mmHg):  [68-84] 79  SpO2:  [98 %-100 %] 98 %    Physical Exam    GENERAL:  Well developed, well-nourished female and looking uncomfortable from pain and NG tube discomfort.   HEAD/NECK:    Reveals normocephalic/atraumatic.   CARDIOVASCULAR:   Regular rate  LUNGS:   Normal work of breathing, no supplemental 02  EXTREMITIES:  Warm, no clubbing, cyanosis, or edema was noted.  NEUROLOGIC:    The patient was alert and oriented times four with normal language, attention, cognition and memory. Cranial nerve exam was grossly normal.    MUSCULOSKELETAL:    SKIN:  No obvious rashes lesions or erythema  PSY:  Appropriate affect and mood.      Problem List    Principal Problem:  Other acute postprocedural pain  Active Problems:    [name]    Intestinal polyps    [name]    Neoplasm related pain    Physical deconditioning    History of colectomy    Intractable abdominal pain    Generalized anxiety disorder with panic attacks    SBO (small bowel obstruction) (CMS-HCC)    Small intestinal bacterial overgrowth (SIBO)         Medical Decision Making    Problems Addressed:  1 or more chronic illnesses with severe exacerbation, progression, or side effects of treatment (high) pain up to 10/1    Amount/Complexity:  Reviewed external records:  Significant notes-hospitalist note 09/28/22, recent surgery consult, neurology consult, H&P, Pain clinic note from June 2024 and [place] PDMP (today on day of visit)  Reviewed results:  Labs and EKG as above  Ordered tests: None  Assessment requiring independent historian: None  Discussion of management/test results with medical professional: Primary team  Independent interpretation of test: None    Risk  High: Drug therapy requiring intensive monitoring for toxicity, Decision regarding hospitalization, Elective Major Surgery w/ risk factors, Emergency Major Surgery, Decision to DNR or de-escalate care because of poor prognosis, parental controlled substances- IV opioids and IV lidocaine infusion      I saw and evaluated the patient, participating in the key portions of the service.  I reviewed the fellow's note and agree with the findings and plan.  Criss Rosales, MD

## 2022-09-29 LAB — PHOSPHORUS: PHOSPHORUS: 4.2 mg/dL (ref 2.4–5.1)

## 2022-09-29 LAB — COMPREHENSIVE METABOLIC PANEL
ALBUMIN: 3 g/dL — ABNORMAL LOW (ref 3.4–5.0)
ALKALINE PHOSPHATASE: 63 U/L (ref 46–116)
ALT (SGPT): 14 U/L (ref 10–49)
ANION GAP: 4 mmol/L — ABNORMAL LOW (ref 5–14)
AST (SGOT): 17 U/L (ref <=34)
BILIRUBIN TOTAL: 0.4 mg/dL (ref 0.3–1.2)
BLOOD UREA NITROGEN: 8 mg/dL — ABNORMAL LOW (ref 9–23)
BUN / CREAT RATIO: 14
CALCIUM: 8.4 mg/dL — ABNORMAL LOW (ref 8.7–10.4)
CHLORIDE: 110 mmol/L — ABNORMAL HIGH (ref 98–107)
CO2: 28 mmol/L (ref 20.0–31.0)
CREATININE: 0.59 mg/dL
EGFR CKD-EPI (2021) FEMALE: >90 mL/min/{1.73_m2} (ref >=60)
GLUCOSE RANDOM: 101 mg/dL (ref 70–179)
POTASSIUM: 3.6 mmol/L (ref 3.4–4.8)
PROTEIN TOTAL: 5.8 g/dL (ref 5.7–8.2)
SODIUM: 142 mmol/L (ref 135–145)

## 2022-09-29 LAB — MAGNESIUM: MAGNESIUM: 2 mg/dL (ref 1.6–2.6)

## 2022-09-29 LAB — CBC
HEMATOCRIT: 31.2 % — ABNORMAL LOW (ref 34.0–44.0)
HEMATOCRIT: 30.9 % — ABNORMAL LOW (ref 34.0–44.0)
HEMOGLOBIN: 10.1 g/dL — ABNORMAL LOW (ref 11.3–14.9)
HEMOGLOBIN: 10 g/dL — ABNORMAL LOW (ref 11.3–14.9)
MEAN CORPUSCULAR HEMOGLOBIN CONC: 32.5 g/dL (ref 32.0–36.0)
MEAN CORPUSCULAR HEMOGLOBIN CONC: 32.5 g/dL (ref 32.0–36.0)
MEAN CORPUSCULAR HEMOGLOBIN: 26.3 pg (ref 25.9–32.4)
MEAN CORPUSCULAR HEMOGLOBIN: 26.4 pg (ref 25.9–32.4)
MEAN CORPUSCULAR VOLUME: 81.1 fL (ref 77.6–95.7)
MEAN CORPUSCULAR VOLUME: 81.3 fL (ref 77.6–95.7)
MEAN PLATELET VOLUME: 9.4 fL (ref 6.8–10.7)
MEAN PLATELET VOLUME: 9.3 fL (ref 6.8–10.7)
PLATELET COUNT: 216 10*9/L (ref 150–450)
PLATELET COUNT: 203 10*9/L (ref 150–450)
RED BLOOD CELL COUNT: 3.84 10*12/L — ABNORMAL LOW (ref 3.95–5.13)
RED BLOOD CELL COUNT: 3.8 10*12/L — ABNORMAL LOW (ref 3.95–5.13)
RED CELL DISTRIBUTION WIDTH: 12.4 % (ref 12.2–15.2)
RED CELL DISTRIBUTION WIDTH: 12.7 % (ref 12.2–15.2)
WBC ADJUSTED: 6.7 10*9/L (ref 3.6–11.2)
WBC ADJUSTED: 5.9 10*9/L (ref 3.6–11.2)

## 2022-09-29 LAB — FERRITIN: FERRITIN: 7.8 ng/mL

## 2022-09-29 LAB — VITAMIN B12: VITAMIN B-12: 710 pg/mL (ref 211–911)

## 2022-09-29 LAB — IRON PANEL
IRON SATURATION: 23 % (ref 20–55)
IRON: 61 ug/dL
TOTAL IRON BINDING CAPACITY: 265 ug/dL (ref 250–425)

## 2022-09-29 MED ADMIN — Parenteral Nutrition (CENTRAL): INTRAVENOUS | @ 01:00:00 | Stop: 2022-09-29 | NDC 96295013582

## 2022-09-29 MED ADMIN — HYDROmorphone (PF) injection Syrg 0.2 mg: .2 mg | INTRAVENOUS | @ 23:00:00 | Stop: 2022-09-29 | NDC 00548194010

## 2022-09-29 MED ADMIN — diphenhydrAMINE (BENADRYL) injection: 12.5 mg | INTRAVENOUS | @ 01:00:00 | Stop: 2022-09-28 | NDC 57866728801

## 2022-09-29 MED ADMIN — promethazine (PHENERGAN) injection 6.25 mg: 6.25 mg | INTRAVENOUS | @ 16:00:00 | NDC 60977000143

## 2022-09-29 MED ADMIN — LORazepam (ATIVAN) injection 0.5 mg: .5 mg | INTRAVENOUS | @ 16:00:00 | NDC 76329826101

## 2022-09-29 MED ADMIN — acetaminophen (OFIRMEV) 10 mg/mL injection 1,000 mg: 1000 mg | INTRAVENOUS | @ 22:00:00 | Stop: 2022-09-30 | NDC 68094059462

## 2022-09-29 MED ADMIN — metoclopramide (REGLAN) injection 10 mg: 10 mg | INTRAVENOUS | @ 22:00:00 | Stop: 2022-09-29 | NDC 00031670442

## 2022-09-29 MED ADMIN — LORazepam (ATIVAN) injection 0.5 mg: .5 mg | INTRAVENOUS | @ 09:00:00 | NDC 76329826101

## 2022-09-29 MED ADMIN — pantoprazole (Protonix) injection 40 mg: 40 mg | INTRAVENOUS | @ 12:00:00 | NDC 82009001190

## 2022-09-29 MED ADMIN — LORazepam (ATIVAN) injection 0.5 mg: .5 mg | INTRAVENOUS | NDC 76329826101

## 2022-09-29 MED ADMIN — pantoprazole (Protonix) injection 40 mg: 40 mg | INTRAVENOUS | NDC 82009001190

## 2022-09-29 MED ADMIN — promethazine (PHENERGAN) injection 6.25 mg: 6.25 mg | INTRAVENOUS | @ 03:00:00 | NDC 60977000143

## 2022-09-29 MED ADMIN — LORazepam (ATIVAN) injection 0.5 mg: .5 mg | INTRAVENOUS | @ 22:00:00 | NDC 76329826101

## 2022-09-29 MED ADMIN — buprenorphine-naloxone (SUBOXONE) 2-0.5 mg SL film 0.5 mg of buprenorphine: .25 | SUBLINGUAL | @ 18:00:00 | NDC 52427069211

## 2022-09-29 MED ADMIN — famotidine (PF) (PEPCID) injection 20 mg: 20 mg | INTRAVENOUS | NDC 96619043921

## 2022-09-29 MED ADMIN — promethazine (PHENERGAN) injection 6.25 mg: 6.25 mg | INTRAVENOUS | @ 21:00:00 | NDC 60977000143

## 2022-09-29 MED ADMIN — promethazine (PHENERGAN) injection 6.25 mg: 6.25 mg | INTRAVENOUS | @ 09:00:00 | Stop: 2022-09-29 | NDC 60977000143

## 2022-09-29 MED ADMIN — ondansetron (ZOFRAN) injection 4 mg: 4 mg | INTRAVENOUS | NDC 71930001752

## 2022-09-29 MED ADMIN — acetaminophen (OFIRMEV) 10 mg/mL injection 1,000 mg: 1000 mg | INTRAVENOUS | @ 09:00:00 | Stop: 2022-09-30 | NDC 68094059462

## 2022-09-29 MED ADMIN — lactated ringers bolus 1,000 mL: 1000 mL | INTRAVENOUS | @ 15:00:00 | Stop: 2022-09-29 | NDC 53191040901

## 2022-09-29 MED ADMIN — HYDROmorphone (PF) injection Syrg 0.5 mg: .5 mg | INTRAVENOUS | @ 04:00:00 | Stop: 2022-09-28 | NDC 00409426411

## 2022-09-29 MED ADMIN — ondansetron (ZOFRAN) injection 4 mg: 4 mg | INTRAVENOUS | @ 23:00:00 | NDC 71930001752

## 2022-09-29 MED ADMIN — buprenorphine-naloxone (SUBOXONE) 2-0.5 mg SL film 0.5 mg of buprenorphine: .25 | SUBLINGUAL | @ 12:00:00 | NDC 52427069211

## 2022-09-29 NOTE — Unmapped (Signed)
Pt is A&Ox4, VSS, O2 sats >90% on 2L [place]. Afebrile. Pt has been in pain all shift. She stated that her baseline pain is a 5/10 and that it decreases to around baseline when she gets the dilaudid but in between doses is goes up to a 10/10 most of the time. Lidocain infusion started this shift. Lidocain infusion start was delayed because we do not stock the the emergency medications if she had a reaction. Pharmacy did deliver the emergency fat emulsion, which is in her patient specific bin, and I initiated the lidocain infusion. Pt has not complained of any new symptoms to indicate she is having toxicity but she did report numbness around her mouth and in some of the fingers on her right hand as a base line before starting the lidocain. She is also having constant nausea which was present before starting the infusion. No emesis this shift. UO adequate, no BM this shift. Pt has remained NPO this shift and TPN has been scheduled to start tonight. NGT has been to low intermittent suction all shift. Its output has varied between yellow and coffee ground in color. A repeat CBC was drawn this afternoon to ensure the patient was not having a lot of bleeding. Hgb stable.  On SCDs for DVT prophylaxis. Pts Mother at bedside all shift. Pt worked with OT today and the massage therapist came to work with her. All monitors with appropriate alarm settings, call bell within reach, see flowsheets/MAR for further info.

## 2022-09-29 NOTE — Unmapped (Addendum)
[facility]  Dept. of Psychiatry Telehealth Encounter  DBT Skills Group       Encounter Description: This encounter was conducted via live, face-to-face video conference in the setting of State of Emergency due to COVID-19 Pandemic. Pt was located in [place]. See Plan for telemedicine consent/disclaimer.       The patient was physically located in [place] or a state in which I am permitted to provide care. The patient and/or parent/guardian understood that s/he may incur co-pays and cost sharing, and agreed to the telemedicine visit. The visit was reasonable and appropriate under the circumstances given the patient's presentation at the time.    The patient and/or parent/guardian has been advised of the potential risks and limitations of this mode of treatment (including, but not limited to, the absence of in-person examination) and has agreed to be treated using telemedicine. The patient's/patient's family's questions regarding telemedicine have been answered.     If the visit was completed in an ambulatory setting, the patient and/or parent/guardian has also been advised to contact their provider???s office for worsening conditions, and seek emergency medical treatment and/or call 911 if the patient deems either necessary.    Time Spent:  90 minutes    Name: [name] [name]  Date: 09/17/2022  MRN: [id number]  DOB: 07/06/2000  PCP: Jarold Motto, [facility]    Service: Outpatient Therapy- Group   [x]  Face to face      Mental Status/Behavioral Observations  Affect:  mood congruent, intensity within normal limits, mobile, full range, responsive   Mood:   stable   Thought Process:  Goal directed and Linear   Behavior:   Cooperative, Direct eye contact, and Polite   Self Harm: none and future oriented     Purpose: DBT Skills groups are for clients who would like to cope more effectively with intense emotions and/or relationship struggles. DBT Skills are taught to reduce self-destructive behaviors and learn more adaptive ways to manage painful emotions.        Interventions Provided:     [x]  DBT, Orientation and Intro to Interpersonal Effectiveness, mindfulness review      Patient Response/Progress:    Client presented for virtual group. She was engaged and responsive throughout. Client participated in reviewing mindfulness and orientation to interpersonal effectiveness module. Client was engaged and participatory throughout DBT group session.    Plan: Pt is required to meet regularly with outpatient therapist in conjunction with attending group.     Goal Addressed: Enhance understanding of DBT skills around Mindfulness, Interpersonal Effectiveness, Distress Tolerance, and improve Emotion Regulation skills.     RISK ASSESSMENT:   There is no acute risk for suicide or violence at this time. The patient was educated about relevant modifiable risk factors including following recommendations for treatment of psychiatric illness and abstaining from substance abuse.   While future psychiatric events cannot be accurately predicted, the patient does not currently require acute inpatient psychiatric care and does not currently meet [facility] involuntary commitment criteria.      Diagnoses:    Diagnosis ICD-10-CM Associated Orders   1. Generalized anxiety disorder with panic attacks  F41.1     F41.0       2. Depression, unspecified depression type  F32.A             Gonzella Lex, LCSW ; Wynetta Emery, LCSW  Clinical Instructor

## 2022-09-29 NOTE — Unmapped (Signed)
Department of Anesthesiology  Pain Medicine Division    Chronic Pain Followup Inpatient Consult Note    Requesting Attending Physician:  Rocco Serene, MD  Service Requesting Consult:  Med Bernita Raisin [facility])    Assessment/Recommendations:    The patient was seen in consultation on request of Rocco Serene, MD regarding assistance with pain management for patient's chronic intractable abdominal pain with now acute on chronic recurrent ileus/NG tube placement.  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      Patient currently established with [facility] pain management center Dr. Dyann Ruddle, has been on oxycodone 10 mg 3 times daily, Suboxone 0.5 mg 3 times daily along with baclofen and pregabalin.  Through her psychiatrist she is on Ativan 0.5 mg 3 times daily as needed.      Patient has had multiple prior inpatient stays either regarding acute exacerbation of chronic pain or ileus.  In the past 12 months she has had 2 infusions of ketamine with limited benefit (last infusion in May 2024) and at one point there was some transaminitis which was thought to be attributed from other etiologies like TPN versus other intra-abdominal pathologies.  In the past patient also has had lidocaine infusions during inpatient stay with limited/variable benefit.           Regarding ileus and her constipation, After discussion with the patient and mother we came to an agreement to attempt with a low-dose of naloxegol 12.5 mg, she has taken enterag around surgery time before and seemed to tolerate it. GI team recommended against neostigmine at this time. I would expect the naloxegol to increase her abdominal pain at first but hope that we can continue it and even increase to 25mg  if able.     This is no doubt a very complex situation. Her biggest issue at this point seems to be the ileus.   Medication management should focus on non-opioid medications, although she has trialed others in past with side effects or minimal benefit.  Hopefully minimizing opioids and working on her ileus will help get her out of the hospital quicker than previously.           Interval: She is overall stable and had some improved pain from 10/10 to 6/10 but can go up to 8/10. She has a lot of nausea today and overall pain. We dicussed leaving the lidocaine on for the full 48 hours and reassessing her pain at that time. We will follow up tomorrow after the lidocaine is completed.        Recommendations:  -The chronic pain service is a consult service and does not place orders, just makes recommendations (except ketamine and lidocaine infusions)   -Please evaluate all patients on opioids for appropriateness of prescribing narcan at discharge.  The chronic pain service can assist with this.  Nasal narcan is covered by most insurances.  -Recommendations given apply to the current hospitalization and do not reflect long term recommendations.    -Recommend to continue lidocaine infusion.  Please follow recommendation as follow  -Patient is on a low dose lidocaine infusion for pain              -All lidocaine orders will be managed by the [facility] only              -Lidocaine requires dedicated IV (PIV or central line  lumen)              -continue  lidocaine infusion at 1mg /kg/hr (ideal body weight)              -Patient location: dose can be increased no sooner than every 4 hours              -Day 1 (max-2 days trial). Started (date): 09/28/22 - 09/30/22              -Prior to starting: normal recent EKG 09/26/22 - normal, Reviewed 09/28/22 ( normal creatinine, LFTs), urine pregnancy test negative 09/26/22              -Daily CMP (creatinine and LFTs daily) while running              -Lipid emulsion available PRN: Local Anesthetic Systemic Toxicity              -Please contact the Chronic Pain service with any questions or concerns about lidocaine infusion.      Recommend to try naloxegol 12.5 mg p.o. daily.  I expect if tis helpful it may increase discomfort/pain at first, but hopefully will be helpful and safe. Alternately can follow recommendation by GI service for other alternative treatment for opiate induced constipation.     -Restart home meds whenever able.  Could schedule baclofen and increase pregabalin.         We will continue to follow.    Naloxone Rx at discharge?  Is patient on opioids? Yes.  1)Is dose >50MME?  Yes.  2) Is patient prescribed a benzodiazepine (w opioids)? Yes.  3)Hx of overdose?  No.  4) Hx of substance use disorder? No.  5) Opioids likely to last greater than a week after discharge? Yes.       If yes to 2 or more, prescribe naloxone at discharge.  Nasal narcan for most insured (Nasal narcan 4mg /actuation, prescribe 1 kit, instructions at [email or web address]).  For uninsured, chronic pain can work to assist in finding an option.  OTC nasal narcan now available at most pharmacies for around $45.    Interim History  There were no Acute Events Overnight.  The patient is not obtaining adequate pain relief on current medication regimen and feels that their pain is Somewhat controlled    Today patient reports pain is somewhat controlled but is better since starting the Lidocaine infusion. She shared her frustration with her situation. She has nausea today and overall pain.      Analgesia Evaluation:  Pain at minimum: 5/10  Pain at maximum: 8/10    Current pain medication regimen (including how frequent PRN's were used):  Suboxone 2-0.5mg  TID   Lidocaine infusion 1mg /kg/hr 48 hours   Tylenol IV   Baclofen 10mg  TID on hold   Robaxin IV 250mg  TID   Oxycodone 15mg  q4h PRN (on hold)     Inpatient Medications  Current Facility-Administered Medications   Medication Dose Route Frequency Provider Last Rate Last Admin    acetaminophen (OFIRMEV) 10 mg/mL injection 1,000 mg  1,000 mg Intravenous Q6H PRN Dorise Hiss, ACNP Stopped at 09/29/22 0600    [Provider Hold] baclofen (LIORESAL) tablet 10 mg  10 mg Oral TID PRN Rocco Serene, MD   10 mg at 09/27/22 1639    buprenorphine-naloxone (SUBOXONE) 2-0.5 mg SL film 0.5 mg of buprenorphine  0.25 Film Sublingual TID Terri Piedra, AGNP   0.5 mg of buprenorphine at 09/29/22 0821    calcium  carbonate (TUMS) chewable tablet 400 mg elem calcium  400 mg elem calcium Oral Daily PRN Terri Piedra, AGNP        [Provider Hold] cefdinir (OMNICEF) capsule 300 mg  300 mg Oral Q12H [facility] Rocco Serene, MD        [Provider Hold] doxycycline (VIBRA-TABS) tablet 100 mg  100 mg Oral BID Rocco Serene, MD        famotidine (PF) (PEPCID) injection 20 mg  20 mg Intravenous At bedtime Rocco Serene, MD   20 mg at 09/28/22 2021    fat emulsion 20 % (INTRAlipid) infusion 100 mL  100 mL Intravenous Once PRN Lobonc, Ammie Ferrier, MD        fat emulsion 20 % (INTRAlipid) infusion 200 mL  200 mL Intravenous Once PRN Lobonc, Ammie Ferrier, MD        lactated ringers bolus 1,000 mL  1,000 mL Intravenous Once Rocco Serene, MD   1,000 mL at 09/29/22 1035    lactated Ringers infusion  50 mL/hr Intravenous Continuous Rocco Serene, MD 50 mL/hr at 09/29/22 0721 50 mL/hr at 09/29/22 0721    lidocaine 2000 mg in dextrose 5 % 250 mL (8 mg/mL) infusion PMB  1 mg/kg/hr (Ideal) Intravenous Continuous Omar Person, MD 7.1 mL/hr at 09/28/22 1512 1 mg/kg/hr at 09/28/22 1512    LORazepam (ATIVAN) injection 0.5 mg  0.5 mg Intravenous Q6H PRN Rocco Serene, MD   0.5 mg at 09/29/22 9629    [Provider Hold] LORazepam (ATIVAN) tablet 0.5 mg  0.5 mg Oral Q6H PRN Rocco Serene, MD        melatonin tablet 3 mg  3 mg Oral Nightly PRN Terri Piedra, AGNP        methocarbamol (ROBAXIN) 250 mg in sodium chloride (NS) 0.9 % 50 mL IVPB  250 mg Intravenous Q8H PRN Rocco Serene, MD        [Provider Hold] mirtazapine (REMERON) tablet 15 mg  15 mg Oral Nightly Terri Piedra, AGNP   15 mg at 09/26/22 2316    ondansetron (ZOFRAN) injection 4 mg  4 mg Intravenous Q6H PRN Terri Piedra, AGNP   4 mg at 09/28/22 2021    [Provider Hold] oxyCODONE (ROXICODONE) immediate release tablet 15 mg  15 mg Oral Q4H PRN Terri Piedra, AGNP   15 mg at 09/27/22 1410    pantoprazole (Protonix) injection 40 mg  40 mg Intravenous BID Rocco Serene, MD   40 mg at 09/29/22 5284    Parenteral Nutrition (CENTRAL)   Intravenous Continuous Rocco Serene, MD 40 mL/hr at 09/28/22 2126 New Bag at 09/28/22 2126    Parenteral Nutrition (CENTRAL)   Intravenous Continuous Rocco Serene, MD        phenol (CHLORASEPTIC) 1.4 % spray 2 spray  2 spray Mucous Membrane Q2H PRN Rocco Serene, MD   2 spray at 09/27/22 1221    polyethylene glycol (MIRALAX) packet 17 g  17 g Oral Daily PRN Terri Piedra, AGNP        promethazine (PHENERGAN) injection 6.25 mg  6.25 mg Intravenous Q6H PRN Rocco Serene, MD             Objective:     Vital Signs    Temp:  [36.6 ??C (97.9 ??F)-36.9 ??C (98.5 ??F)] 36.9 ??C (98.5 ??F)  Heart Rate:  [67-88] 77  SpO2 Pulse:  [68-73] 73  Resp:  [11-18] 13  BP: (101-111)/(46-57) 103/46  MAP (mmHg):  [60-69] 65  SpO2:  [99 %] 99 %      Physical Exam     GENERAL:  Well developed, well-nourished female and looking uncomfortable from pain and NG tube discomfort.   HEAD/NECK:    Reveals normocephalic/atraumatic.   CARDIOVASCULAR:   Regular rate  LUNGS:   Normal work of breathing, no supplemental 02  EXTREMITIES:  Warm, no clubbing, cyanosis, or edema was noted.  NEUROLOGIC:    The patient was alert and oriented times four with normal language, attention, cognition and memory. Cranial nerve exam was grossly normal.    MUSCULOSKELETAL:    SKIN:  No obvious rashes lesions or erythema  PSY:  Appropriate affect and mood.    Test Results    Lab Results   Component Value Date    CREATININE 0.59 09/29/2022     Lab Results   Component Value Date    ALKPHOS 63 09/29/2022    BILITOT 0.4 09/29/2022    BILIDIR <0.10 08/08/2022    PROT 5.8 09/29/2022    ALBUMIN 3.0 (L) 09/29/2022    ALT 14 09/29/2022    AST 17 09/29/2022           Problem List    Principal Problem:    Other acute postprocedural pain  Active Problems:    [name]    Intestinal polyps    [name]    Neoplasm related pain    Physical deconditioning    History of colectomy    Intractable abdominal pain    Generalized anxiety disorder with panic attacks    SBO (small bowel obstruction) (CMS-HCC)    Small intestinal bacterial overgrowth (SIBO)      [place], DO   PGY-5 Pain Fellow

## 2022-09-29 NOTE — Unmapped (Signed)
Massage Therapy Inpatient Consult Note    Patient: [name] [name]   Pronouns: she/her/hers    Date of birth: 04-03-2000    Service: Clinical Massage    Requesting Attending Physician :  Rocco Serene, MD  Service Requesting Consult : Med [facility])  Reason for Consult: pain    Visit Date: 09/28/2022    Precautions: No active isolations    Allergies:  Adhesive tape-silicones; Ferrlecit [sodium ferric gluconat-sucrose]; Levofloxacin; Methylnaltrexone; Neomycin; Papaya; Morphine; Zosyn [piperacillin-tazobactam]; Compazine [prochlorperazine]; Iron analogues; Iron dextran; and Latex, natural rubber    Code Status:  Full Code    ----------------------------------------------------------------    Visit Summary:  Dear [name] [name] and her mom [name] [name] brought me up to date on this latest admission, and my heart hurts for the stress of everything for them. [name] [name]'s sister, [name] [name] is visiting, and she and their mom went into the lobby to eat some dinner together during the session. I am so grateful to be part of this family's care team, and I thank you for the consult!     Gwendola let me know that her legs are in a lot of pain and stiffness. She described to me how she was shaking and her muscles were locking up uncontrollably. She worked with OT right before I came, and she told me that they did help her joints to move a little better.     I decided to mainly use gentle shaking of the limbs and feet to see if that could help relax her muscles at all. She said that it seemed to have help a little bit by the end of the session, but she was still tight and in pain. I will try to come back as often as I am able to work with her if she would like.     Please feel free to reach out on EPIC chat or vocera if she requests a session at this admission, no need for another order. I will be back in house on Wednesday, July 24.     ----------------------------------------------------------------    Treatment Goals: To assist with the management of pain and tension due to disease, treatment, and prolonged hospital stay.       Procedure Detail: Effleurage, Static holds/Light compression, Gentle rocking/vibration, Comfort touch, and Joint mobilization    I use a modified Walton Pressure Scale in an effort to standardize documentation of this critical component of any touch therapy. My hope is to make the pressure I use clearly understandable to all team members involved in the care of each patient I am fortunate to work with.    Level 0: No pressure. Energy work or Management consultant. (No tissue is moved)   Level zero is not included in the WPS, I added it for my practice.    Level 1: Light lotioning (Only the skin is moved)  Level 2: Heavy lotioning (Similar to rubbing in sunscreen. Slight displacement of the superficial adipose layer and superficial muscles)  Level 3: Medium pressure (Medium layers of adipose, muscle, and blood vessels are moved, adjacent tissues move slightly)    Levels 4 and 5 are only used with healthy patients. I do not go beyond a level 3 in my practice.    Level 4: Strong pressure (Deep layers of adipose, muscle, blood vessels, and fascia are moved)  Level 5: (Significant compression of all tissues. Therapists bone affects patient's bone)    Pressure Level: 0-3    Position: Semi-fowler    Areas of  the body massaged: Lower extremities including feet      Areas of the body avoided:       Lubricant Used: None    Music: Good conversation      Treatment Time: 50 minutes    Total time spent on this patient's case today: 2+ hours    This is visit #: 6    ----------------------------------------------------------------    Current Oncology Treatment: Chemotherapy, Surgery, and Other    Sensory Impairment: None    Site Restrictions: Types of Site Restriction: Ostomy IV Site Tumor    Position Restrictions: Other: as comfort allows    Personal Protective Equipment Used: Mask and Gloves    ----------------------------------------------------------------    Before treatment: (None / Some / A Lot) or N/A           Pain: A lot  Stress/Tension: A lot  Swelling: N/A      After treatment: (Better / Same / Worse) or N/A    Pain: Better  Stress/Tension: Better  Swelling: N/A    ------------------------------------------------------------------    Medical History/Problem List:  Principal Problem:    Other acute postprocedural pain  Active Problems:    [name]    Intestinal polyps    [name]    Neoplasm related pain    Physical deconditioning    History of colectomy    Intractable abdominal pain    Generalized anxiety disorder with panic attacks    SBO (small bowel obstruction) (CMS-HCC)    Small intestinal bacterial overgrowth (SIBO)      Surgical History:  Past Surgical History:   Procedure Laterality Date    COLON SURGERY      cyroablation      cystis removal      desmoid removal      PR CLOSE ENTEROSTOMY,RESEC+ANAST N/A 10/09/2020    Procedure: ILEOSTOMY TAKEDOWN;  Surgeon: Mickle Asper, MD;  Location: OR [place];  Service: General Surgery    PR COLONOSCOPY W/BIOPSY SINGLE/MULTIPLE N/A 10/27/2012    Procedure: COLONOSCOPY, FLEXIBLE, PROXIMAL TO SPLENIC FLEXURE; WITH BIOPSY, SINGLE OR MULTIPLE;  Surgeon: Shirlyn Goltz Mir, MD;  Location: PEDS PROCEDURE ROOM [place];  Service: Gastroenterology    PR COLONOSCOPY W/BIOPSY SINGLE/MULTIPLE N/A 09/14/2013    Procedure: COLONOSCOPY, FLEXIBLE, PROXIMAL TO SPLENIC FLEXURE; WITH BIOPSY, SINGLE OR MULTIPLE;  Surgeon: Shirlyn Goltz Mir, MD;  Location: PEDS PROCEDURE ROOM [place];  Service: Gastroenterology    PR COLONOSCOPY W/BIOPSY SINGLE/MULTIPLE N/A 11/08/2014    Procedure: COLONOSCOPY, FLEXIBLE, PROXIMAL TO SPLENIC FLEXURE; WITH BIOPSY, SINGLE OR MULTIPLE;  Surgeon: Arnold Long Mir, MD;  [facility]: PEDS PROCEDURE ROOM [facility];  Service: Gastroenterology    PR COLONOSCOPY W/BIOPSY SINGLE/MULTIPLE N/A 12/26/2015    Procedure: COLONOSCOPY, FLEXIBLE, PROXIMAL TO SPLENIC FLEXURE; WITH BIOPSY, SINGLE OR MULTIPLE;  Surgeon: Arnold Long Mir, MD;  Location: PEDS PROCEDURE ROOM [facility];  Service: Gastroenterology    PR COLONOSCOPY W/BIOPSY SINGLE/MULTIPLE N/A 09/02/2017    Procedure: COLONOSCOPY, FLEXIBLE, PROXIMAL TO SPLENIC FLEXURE; WITH BIOPSY, SINGLE OR MULTIPLE;  Surgeon: Arnold Long Mir, MD;  Location: PEDS PROCEDURE ROOM [place];  Service: Gastroenterology    PR COLSC FLX W/REMOVAL LESION BY HOT BX FORCEPS N/A 08/27/2016    Procedure: COLONOSCOPY, FLEXIBLE, PROXIMAL TO SPLENIC FLEXURE; W/REMOVAL TUMOR/POLYP/OTHER LESION, HOT BX FORCEP/CAUTE;  Surgeon: Arnold Long Mir, MD;  Location: PEDS PROCEDURE ROOM [facility];  Service: Gastroenterology    PR COLSC FLX W/RMVL OF TUMOR POLYP LESION SNARE TQ N/A 02/25/2019    Procedure: COLONOSCOPY FLEX; W/REMOV TUMOR/LES BY SNARE;  Surgeon:  Helyn Numbers, MD;  [facility]: [facility] [place] [facility];  Service: Gastroenterology    PR COLSC FLX W/RMVL OF TUMOR POLYP LESION SNARE TQ N/A 03/13/2020    Procedure: COLONOSCOPY FLEX; W/REMOV TUMOR/LES BY SNARE;  Surgeon: Helyn Numbers, MD;  [facility]: [facility] [place] [facility];  Service: Gastroenterology    PR EXC SKIN BENIG 2.1-3 CM TRUNK,ARM,LEG Right 02/25/2017    Procedure: EXCISION, BENIGN LESION INCLUDE MARGINS, EXCEPT SKIN TAG, LEGS; EXCISED DIAMETER 2.1 TO 3.0 CM;  Surgeon: Clarene Duke, MD;  Location: CHILDRENS OR [facility];  Service: Plastics    PR EXC SKIN BENIG 3.1-4 CM TRUNK,ARM,LEG Right 02/25/2017    Procedure: EXCISION, BENIGN LESION INCLUDE MARGINS, EXCEPT SKIN TAG, ARMS; EXCISED DIAMETER 3.1 TO 4.0 CM;  Surgeon: Clarene Duke, MD;  Location: CHILDRENS OR [facility];  Service: Plastics    PR EXC SKIN BENIG >4 CM FACE,FACIAL Right 02/25/2017    Procedure: EXCISION, OTHER BENIGN LES INCLUD MARGIN, FACE/EARS/EYELIDS/NOSE/LIPS/MUCOUS MEMBRANE; EXCISED DIAM >4.0 CM;  Surgeon: Clarene Duke, MD;  Location: CHILDRENS OR [facility];  Service: Plastics    PR EXC TUMOR SOFT TISSUE LEG/ANKLE SUBQ 3+CM Right 08/05/2019    Procedure: EXCISION, TUMOR, SOFT TISSUE OF LEG OR ANKLE AREA, SUBCUTANEOUS; 3 CM OR GREATER;  Surgeon: Arsenio Katz, MD;  Location: MAIN OR [place];  Service: Plastics    PR EXC TUMOR SOFT TISSUE LEG/ANKLE SUBQ <3CM Right 08/05/2019    Procedure: EXCISION, TUMOR, SOFT TISSUE OF LEG OR ANKLE AREA, SUBCUTANEOUS; LESS THAN 3 CM;  Surgeon: Arsenio Katz, MD;  Location: MAIN OR [facility];  Service: Plastics    PR LAP, SURG PROCTECTOMY W J-POUCH N/A 08/10/2020    Procedure: ROBOTIC ASSISTED LAPAROSCOPIC PROCTOCOLECTOMY, ILEAL J POUCH, WITH OSTOMY;  Surgeon: Mickle Asper, MD;  Location: OR [place];  Service: General Surgery    PR NDSC EVAL INTSTINAL POUCH DX W/COLLJ SPEC SPX N/A 01/23/2021    Procedure: ENDO EVAL SM INTEST POUCH; DX;  Surgeon: Modena Nunnery, MD;  Location: [facility] [place] [facility];  Service: Gastroenterology    PR NDSC EVAL INTSTINAL POUCH DX W/COLLJ SPEC SPX N/A 08/27/2021    Procedure: ENDO EVAL SM INTEST POUCH; DX;  Surgeon: Hunt Oris, MD;  [facility]: [facility];  Service: Gastroenterology    PR NDSC EVAL INTSTINAL POUCH DX W/COLLJ SPEC SPX N/A 12/09/2021    Procedure: ENDO EVAL SM INTEST POUCH; DX;  Surgeon: Vidal Schwalbe, MD;  [facility]: [facility]URES [facility];  Service: Gastroenterology    PR NDSC EVAL INTSTINAL POUCH DX W/COLLJ [facility] SPX Left 04/09/2022    Procedure: ENDO EVAL SM INTEST POUCH; DX;  Surgeon: Modena Nunnery, MD;  Location: [facility] [place] [facility];  Service: Gastroenterology    PR NDSC EVAL INTSTINAL POUCH DX W/COLLJ SPEC SPX N/A 08/05/2022    Procedure: ENDO EVAL SM INTEST POUCH; DX;  Surgeon: Modena Nunnery, MD;  Location: [facility] PROC[facility]URES [facility];  Service: Gastroenterology    PR NDSC EVAL INTSTINAL POUCH W/BX SINGLE/MULTIPLE N/A 01/20/2022    Procedure: ENDOSCOPIC EVAL OF SMALL INTESTINAL POUCH; DIAGNOSTIC, No biopsies;  Surgeon: Andrey Farmer, MD;  Location: GI PROCEDURES [facility];  Service: Gastroenterology    PR NDSC EVAL INTSTINAL POUCH W/BX SINGLE/MULTIPLE N/A 02/13/2022    Procedure: ENDOSCOPIC EVAL OF SMALL INTESTINAL POUCH; DIAGNOSTIC, WITH BIOPSY;  Surgeon: Bronson Curb, MD;  [facility]: [facility]URES [facility];  Service: Gastroenterology    PR UNLISTED PROCEDURE SMALL INTESTINE  01/23/2021  Procedure: UNLISTED PROCEDURE, SMALL INTESTINE;  Surgeon: Modena Nunnery, MD;  Location: [facility] [place] [facility];  Service: Gastroenterology    PR UNLISTED PROCEDURE SMALL INTESTINE  02/13/2022    Procedure: UNLISTED PROCEDURE, SMALL INTESTINE;  Surgeon: Bronson Curb, MD;  Location: [facility] PROC[facility]URES [facility];  Service: Gastroenterology    PR UPPER GI ENDOSCOPY,BIOPSY N/A 10/27/2012    Procedure: UGI ENDOSCOPY; WITH BIOPSY, SINGLE OR MULTIPLE;  Surgeon: Shirlyn Goltz Mir, MD;  Location: PEDS PROCEDURE ROOM [facility];  Service: Gastroenterology    PR UPPER GI ENDOSCOPY,BIOPSY N/A 09/14/2013    Procedure: UGI ENDOSCOPY; WITH BIOPSY, SINGLE OR MULTIPLE;  Surgeon: Shirlyn Goltz Mir, MD;  Location: PEDS PROCEDURE ROOM [facility];  Service: Gastroenterology    PR UPPER GI ENDOSCOPY,BIOPSY N/A 11/08/2014    Procedure: UGI ENDOSCOPY; WITH BIOPSY, SINGLE OR MULTIPLE;  Surgeon: Arnold Long Mir, MD;  Location: PEDS PROCEDURE ROOM [facility];  Service: Gastroenterology    PR UPPER GI ENDOSCOPY,BIOPSY N/A 12/26/2015    Procedure: UGI ENDOSCOPY; WITH BIOPSY, SINGLE OR MULTIPLE;  Surgeon: Arnold Long Mir, MD;  Location: PEDS PROCEDURE ROOM [facility];  Service: Gastroenterology    PR UPPER GI ENDOSCOPY,BIOPSY N/A 08/27/2016    Procedure: UGI ENDOSCOPY; WITH BIOPSY, SINGLE OR MULTIPLE;  Surgeon: Arnold Long Mir, MD;  Location: PEDS PROCEDURE ROOM [facility];  Service: Gastroenterology    PR UPPER GI ENDOSCOPY,BIOPSY N/A 09/02/2017    Procedure: UGI ENDOSCOPY; WITH BIOPSY, SINGLE OR MULTIPLE;  Surgeon: Arnold Long Mir, MD;  Location: PEDS PROCEDURE ROOM [facility];  Service: Gastroenterology    PR UPPER GI ENDOSCOPY,BIOPSY N/A 03/13/2020    Procedure: UGI ENDOSCOPY; WITH BIOPSY, SINGLE OR MULTIPLE;  Surgeon: Helyn Numbers, MD;  Location: [facility] [place] [facility];  Service: Gastroenterology    PR UPPER GI ENDOSCOPY,BIOPSY N/A 09/05/2021    Procedure: UGI ENDOSCOPY; WITH BIOPSY, SINGLE OR MULTIPLE;  Surgeon: Wendall Papa, MD;  [facility]: [facility]URES [facility];  Service: Gastroenterology    PR UPPER GI ENDOSCOPY,DIAGNOSIS N/A 01/20/2022    Procedure: UGI ENDO, INCLUDE ESOPHAGUS, STOMACH, & DUODENUM &/OR JEJUNUM; DX W/WO COLLECTION SPECIMN, BY BRUSH OR WASH;  Surgeon: Andrey Farmer, MD;  Location: [facility]URES [facility];  Service: Gastroenterology    TUMOR REMOVAL      multiple-head, neck, back, hand, right flank, multiple       Family History:  family history includes Alcohol abuse in her paternal grandfather; Arthritis in her maternal grandfather; Asthma in her maternal grandfather; COPD in her paternal grandmother; Cancer in her maternal grandmother; Diabetes in her maternal grandmother; Hypertension in her maternal grandmother; Miscarriages / [place] in her paternal grandmother; No Known Problems in her brother, father, maternal aunt, maternal uncle, mother, paternal aunt, paternal uncle, and sister; Other in her maternal grandmother; Stroke in her maternal grandmother; Thyroid disease in her maternal grandmother.    ----------------------------------------------------------------    Plan/Follow-Up:    A recommendation was made for Clinical Massage at least 1x per 2 weeks      Patient resting in bed post-session, call light/all needs within reach, fall precautions in place.      I am deeply honored to be a part of this patient's care. Thank you so very much for the opportunity to work with them! Please reach out with any questions or concerns.     There is no need to put in a new consult at this admission, if this patient requests massage in the future, you can send me a message on EPIC chat, or call me  on vocera.     I am generally here T,W,Th & most weekends.     Please feel free to put in an order for a new consult at any subsequent admission. Thank you so much!

## 2022-09-29 NOTE — Unmapped (Signed)
OCCUPATIONAL THERAPY  Evaluation (09/28/22 1612)    Patient Name:  [name] [name]       Medical Record Number: [id number]   Date of Birth: [birth year]  Sex: Female      Post-Discharge Occupational Therapy Recommendations:   High intensity, 5x weekly       OT DME Recommendations: Defer to post acute -              OT Treatment Diagnosis:  Pt presents with decreased functional mobility, endurance, and reduced activity tolerance impacting ADL participation.    Assessment  Problem List: Decreased strength, Impaired ADLs, Decreased activity tolerance, Decreased mobility, Fall risk, Gait deviation, Impaired balance  Assessment: [name] [name] is a 22 y.o. female that presented to [facility] with Other acute postprocedural pain.  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 Pt seen for occupational profile and evaluation. With consideration of client factors, clinical decision making, assessment review, and intervention plan pt presents as a moderate complexity case with above functional deficits impacting safe and IND participation in ADL. Pt limited by BLE rigidity that worsened with activity and decreased functional endurance, though able to complete sup > sit with Min A, short room functional mobility with CGA, STS transfers for toilet/EOB with Min A, Sit > sup with Mod A. Pt would continue to benefit from skilled acute OT services to maximize safety and IND. Recommend post acute 5xH.  Today's Interventions: Pt educ on OT role , POC      AM-PAC-Daily Activity  Lower Body Dressing assistance needs: A lot - Maximum/Moderate Assistance  Bathing assistance needs: A lot - Maximum/Moderate Assistance  Toileting assistance needs: A Little - Minimal/Contact Guard Assist/Supervision  Upper Body Dressing assistance needs: A Little - Minimal/Contact Guard Assist/Supervision  Personal Grooming assistance needs: A Little - Minimal/Contact Guard Assist/Supervision  Eating Meals assistance needs: None - Modified Independent/Independent    Daily Activity Score:  Daily Activity Score: 17    Score (in points): % of Functional Impairment, Limitation, Restriction  6: 100% impaired, limited, restricted  7-8: At least 80%, but less than 100% impaired, limited restricted  9-13: At least 60%, but less than 80% impaired, limited restricted  14-19: At least 40%, but less than 60% impaired, limited restricted  20-22: At least 20%, but less than 40% impaired, limited restricted  23: At least 1%, but less than 20% impaired, limited restricted  24: 0% impaired, limited restricted    Activity Tolerance During Today's Session  Tolerated treatment well      Plan  Planned Frequency of Treatment: Plan of Care Initiated: 09/28/22    1-2x per day for: 3-4x week    Planned Treatment Duration: 10/26/22    Planned Interventions:  Self-Care/Home Training, Therapeutic Exercise, Therapeutic Activity, Education (Patient/Family/Caregiver), Home Exercise Program      GOALS:   Patient and Family Goals: to go home    Short Term:  SHORT GOAL #1: Pt will complete toileting routine with mod I   Time Frame : 2 weeks    SHORT GOAL #2: Pt will complete LBD with Mod I + AE PRN   Time Frame : 2 weeks    SHORT GOAL #3: Pt will complete 5+ min standing grooming ADL with supervision + LRAD   Time Frame : 2 weeks    SHORT GOAL #4: Pt will complete toilet t/f with mod I   Time Frame : 2 weeks  Long Term Goal #1: Pt will score 23+/24 on AMPAC    Time Frame: 4 weeks    Prognosis:  Good  Positive Indicators:  age, support, participation  Barriers to Discharge: Gait instability, Impaired Balance, Endurance deficits, Inaccessible home environment, Functional strength deficits, Inability to safely perform ADLS    Subjective  Medical Updates Since Last Visit:      Prior Functional Status   Per recent epic note: Pt/mother report that at pt's baseline, she is able to ambulate with no device, and took a trip to the beach a few weeks ago, although does require activity modification due to fatigue and some weakness. She was able to ambulate on the sand and also able to play mini-golf but unable to bend down to grab a ball. Since her egg retrieval and the initiation of her new chemo medication, she has been spending most of her time in bed. No falls in the past month, but did have a few falls last hospitalization due to syncope. Pt reported increased difficulty with mobility following out patient procedure, increased fatigue, rigidity and weakness.    Living Situation  Living Environment: House  Lives With: Mother, Father  Home Living: Multi-level home, Bed/bath upstairs, 1/2 bath on main level, Stairs to alternate level with rails, Stairs to enter without rails, Stairs to alternate level without rails, Handicapped height toilet, Tub/shower unit, Able to Live on main level with bedroom/bathroom  Number of Stairs to Enter (outside): 2  Rail placement (inside): Rail on right side  Number of Stairs to Alternate level (inside): 14         Equipment available at home: Rollator Nurse, children's)         Past Medical History:   Diagnosis Date    Abdominal pain     Acid reflux     occas    Anesthesia complication     itching, shaking, coldness; last few surgeries have gone much better    Cancer (CMS-HCC)     Cataract of right eye     COVID-19 virus infection 01/2019    Cyst of thyroid determined by ultrasound     monitoring    [name]     2 right forearm, 1 left thigh, 1 right scapula, 1 under left clavicle; multiple    Difficult intravenous access     [name] (familial adenomatous polyposis)     [name]     Gastric polyps     History of chemotherapy     last treatment approx 05/2019    History of colon polyps     History of COVID-19 01/2019    Iron deficiency anemia due to chronic blood loss     received iron infusion 11-2019    PONV (postoperative nausea and vomiting)     Rectal bleeding Syncopal episodes     especially if becoming dehydrated    Social History     Tobacco Use    Smoking status: Never     Passive exposure: Past    Smokeless tobacco: Never   Substance Use Topics    Alcohol use: Never      Past Surgical History:   Procedure Laterality Date    COLON SURGERY      cyroablation      cystis removal      desmoid removal      PR CLOSE ENTEROSTOMY,RESEC+ANAST N/A 10/09/2020    Procedure: ILEOSTOMY TAKEDOWN;  Surgeon: Mickle Asper, MD;  Location: OR [place];  Service: General Surgery  PR COLONOSCOPY W/BIOPSY SINGLE/MULTIPLE N/A 10/27/2012    Procedure: COLONOSCOPY, FLEXIBLE, PROXIMAL TO SPLENIC FLEXURE; WITH BIOPSY, SINGLE OR MULTIPLE;  Surgeon: Shirlyn Goltz Mir, MD;  Location: PEDS PROCEDURE ROOM [facility];  Service: Gastroenterology    PR COLONOSCOPY W/BIOPSY SINGLE/MULTIPLE N/A 09/14/2013    Procedure: COLONOSCOPY, FLEXIBLE, PROXIMAL TO SPLENIC FLEXURE; WITH BIOPSY, SINGLE OR MULTIPLE;  Surgeon: Shirlyn Goltz Mir, MD;  Location: PEDS PROCEDURE ROOM [place];  Service: Gastroenterology    PR COLONOSCOPY W/BIOPSY SINGLE/MULTIPLE N/A 11/08/2014    Procedure: COLONOSCOPY, FLEXIBLE, PROXIMAL TO SPLENIC FLEXURE; WITH BIOPSY, SINGLE OR MULTIPLE;  Surgeon: Arnold Long Mir, MD;  Location: PEDS PROCEDURE ROOM [facility];  Service: Gastroenterology    PR COLONOSCOPY W/BIOPSY SINGLE/MULTIPLE N/A 12/26/2015    Procedure: COLONOSCOPY, FLEXIBLE, PROXIMAL TO SPLENIC FLEXURE; WITH BIOPSY, SINGLE OR MULTIPLE;  Surgeon: Arnold Long Mir, MD;  Location: PEDS PROCEDURE ROOM [facility];  Service: Gastroenterology    PR COLONOSCOPY W/BIOPSY SINGLE/MULTIPLE N/A 09/02/2017    Procedure: COLONOSCOPY, FLEXIBLE, PROXIMAL TO SPLENIC FLEXURE; WITH BIOPSY, SINGLE OR MULTIPLE;  Surgeon: Arnold Long Mir, MD;  Location: PEDS PROCEDURE ROOM [place];  Service: Gastroenterology    PR COLSC FLX W/REMOVAL LESION BY HOT BX FORCEPS N/A 08/27/2016    Procedure: COLONOSCOPY, FLEXIBLE, PROXIMAL TO SPLENIC FLEXURE; W/REMOVAL TUMOR/POLYP/OTHER LESION, HOT BX FORCEP/CAUTE;  Surgeon: Arnold Long Mir, MD;  Location: PEDS PROCEDURE ROOM [facility];  Service: Gastroenterology    PR COLSC FLX W/RMVL OF TUMOR POLYP LESION SNARE TQ N/A 02/25/2019    Procedure: COLONOSCOPY FLEX; W/REMOV TUMOR/LES BY SNARE;  Surgeon: Helyn Numbers, MD;  Location: [place] PROCEDURES [place] [facility];  Service: Gastroenterology    PR COLSC FLX W/RMVL OF TUMOR POLYP LESION SNARE TQ N/A 03/13/2020    Procedure: COLONOSCOPY FLEX; W/REMOV TUMOR/LES BY SNARE;  Surgeon: Helyn Numbers, MD;  Location: [place] [facility]EDURES [place] [facility];  Service: Gastroenterology    PR EXC SKIN BENIG 2.1-3 CM TRUNK,ARM,LEG Right 02/25/2017    Procedure: EXCISION, BENIGN LESION INCLUDE MARGINS, EXCEPT SKIN TAG, LEGS; EXCISED DIAMETER 2.1 TO 3.0 CM;  Surgeon: Clarene Duke, MD;  Location: CHILDRENS OR [facility];  Service: Plastics    PR EXC SKIN BENIG 3.1-4 CM TRUNK,ARM,LEG Right 02/25/2017    Procedure: EXCISION, BENIGN LESION INCLUDE MARGINS, EXCEPT SKIN TAG, ARMS; EXCISED DIAMETER 3.1 TO 4.0 CM;  Surgeon: Clarene Duke, MD;  Location: CHILDRENS OR [facility];  Service: Plastics    PR EXC SKIN BENIG >4 CM FACE,FACIAL Right 02/25/2017    Procedure: EXCISION, OTHER BENIGN LES INCLUD MARGIN, FACE/EARS/EYELIDS/NOSE/LIPS/MUCOUS MEMBRANE; EXCISED DIAM >4.0 CM;  Surgeon: Clarene Duke, MD;  Location: CHILDRENS OR [facility];  Service: Plastics    PR EXC TUMOR SOFT TISSUE LEG/ANKLE SUBQ 3+CM Right 08/05/2019    Procedure: EXCISION, TUMOR, SOFT TISSUE OF LEG OR ANKLE AREA, SUBCUTANEOUS; 3 CM OR GREATER;  Surgeon: Arsenio Katz, MD;  Location: MAIN OR [place];  Service: Plastics    PR EXC TUMOR SOFT TISSUE LEG/ANKLE SUBQ <3CM Right 08/05/2019    Procedure: EXCISION, TUMOR, SOFT TISSUE OF LEG OR ANKLE AREA, SUBCUTANEOUS; LESS THAN 3 CM;  Surgeon: Arsenio Katz, MD;  Location: MAIN OR [facility];  Service: Plastics    PR LAP, SURG PROCTECTOMY W J-POUCH N/A 08/10/2020    Procedure: ROBOTIC ASSISTED LAPAROSCOPIC PROCTOCOLECTOMY, ILEAL J POUCH, WITH OSTOMY;  Surgeon: Mickle Asper, MD;  Location: OR [place];  Service: General Surgery    PR NDSC EVAL INTSTINAL POUCH DX W/COLLJ SPEC SPX N/A 01/23/2021    Procedure: ENDO EVAL SM INTEST POUCH;  DX;  Surgeon: Modena Nunnery, MD;  Location: [place] PROCEDURES [place] [facility];  Service: Gastroenterology    PR NDSC EVAL INTSTINAL POUCH DX W/COLLJ SPEC SPX N/A 08/27/2021    Procedure: ENDO EVAL SM INTEST POUCH; DX;  Surgeon: Hunt Oris, MD;  Location: GI PROCEDURES [facility] [place];  Service: Gastroenterology    PR NDSC EVAL INTSTINAL POUCH DX W/COLLJ SPEC SPX N/A 12/09/2021    Procedure: ENDO EVAL SM INTEST POUCH; DX;  Surgeon: Vidal Schwalbe, MD;  Location: GI PROCEDURES [facility];  Service: Gastroenterology    PR NDSC EVAL INTSTINAL POUCH DX W/COLLJ [facility] SPX Left 04/09/2022    Procedure: ENDO EVAL SM INTEST POUCH; DX;  Surgeon: Modena Nunnery, MD;  Location: [place] [facility] [place] [facility];  Service: Gastroenterology    PR NDSC EVAL INTSTINAL POUCH DX W/COLLJ SPEC SPX N/A 08/05/2022    Procedure: ENDO EVAL SM INTEST POUCH; DX;  Surgeon: Modena Nunnery, MD;  Location: GI PROCEDURES [facility];  Service: Gastroenterology    PR NDSC EVAL INTSTINAL POUCH W/BX SINGLE/MULTIPLE N/A 01/20/2022    Procedure: ENDOSCOPIC EVAL OF SMALL INTESTINAL POUCH; DIAGNOSTIC, No biopsies;  Surgeon: Andrey Farmer, MD;  Location: GI PROCEDURES [facility];  Service: Gastroenterology    PR NDSC EVAL INTSTINAL POUCH W/BX SINGLE/MULTIPLE N/A 02/13/2022    Procedure: ENDOSCOPIC EVAL OF SMALL INTESTINAL POUCH; DIAGNOSTIC, WITH BIOPSY;  Surgeon: Bronson Curb, MD;  Location: GI PROCEDURES [facility];  Service: Gastroenterology    PR UNLISTED PROCEDURE SMALL INTESTINE  01/23/2021    Procedure: UNLISTED PROCEDURE, SMALL INTESTINE;  Surgeon: Modena Nunnery, MD;  Location: [place] PROC[facility] [place] [facility];  Service: Gastroenterology    PR UNLISTED PROCEDURE SMALL INTESTINE 02/13/2022    Procedure: UNLISTED PROCEDURE, SMALL INTESTINE;  Surgeon: Bronson Curb, MD;  Location: GI PROCEDURES [facility];  Service: Gastroenterology    PR UPPER GI ENDOSCOPY,BIOPSY N/A 10/27/2012    Procedure: UGI ENDOSCOPY; WITH BIOPSY, SINGLE OR MULTIPLE;  Surgeon: Shirlyn Goltz Mir, MD;  Location: PEDS PROCEDURE ROOM [facility];  Service: Gastroenterology    PR UPPER GI ENDOSCOPY,BIOPSY N/A 09/14/2013    Procedure: UGI ENDOSCOPY; WITH BIOPSY, SINGLE OR MULTIPLE;  Surgeon: Shirlyn Goltz Mir, MD;  Location: PEDS PROCEDURE ROOM [facility];  Service: Gastroenterology    PR UPPER GI ENDOSCOPY,BIOPSY N/A 11/08/2014    Procedure: UGI ENDOSCOPY; WITH BIOPSY, SINGLE OR MULTIPLE;  Surgeon: Arnold Long Mir, MD;  Location: PEDS PROCEDURE ROOM [facility];  Service: Gastroenterology    PR UPPER GI ENDOSCOPY,BIOPSY N/A 12/26/2015    Procedure: UGI ENDOSCOPY; WITH BIOPSY, SINGLE OR MULTIPLE;  Surgeon: Arnold Long Mir, MD;  Location: PEDS PROCEDURE ROOM [facility];  Service: Gastroenterology    PR UPPER GI ENDOSCOPY,BIOPSY N/A 08/27/2016    Procedure: UGI ENDOSCOPY; WITH BIOPSY, SINGLE OR MULTIPLE;  Surgeon: Arnold Long Mir, MD;  Location: PEDS PROCEDURE ROOM [facility];  Service: Gastroenterology    PR UPPER GI ENDOSCOPY,BIOPSY N/A 09/02/2017    Procedure: UGI ENDOSCOPY; WITH BIOPSY, SINGLE OR MULTIPLE;  Surgeon: Arnold Long Mir, MD;  Location: PEDS PROCEDURE ROOM [facility];  Service: Gastroenterology    PR UPPER GI ENDOSCOPY,BIOPSY N/A 03/13/2020    Procedure: UGI ENDOSCOPY; WITH BIOPSY, SINGLE OR MULTIPLE;  Surgeon: Helyn Numbers, MD;  Location: [facility] [place] [facility];  Service: Gastroenterology    PR UPPER GI ENDOSCOPY,BIOPSY N/A 09/05/2021    Procedure: UGI ENDOSCOPY; WITH BIOPSY, SINGLE OR MULTIPLE;  Surgeon: Wendall Papa, MD;  Location: GI PROCEDURES [facility];  Service: Gastroenterology    PR  UPPER GI ENDOSCOPY,DIAGNOSIS N/A 01/20/2022    Procedure: UGI ENDO, INCLUDE ESOPHAGUS, STOMACH, & DUODENUM &/OR JEJUNUM; DX W/WO COLLECTION SPECIMN, BY BRUSH OR WASH;  Surgeon: Andrey Farmer, MD;  Location: GI PROCEDURES [facility];  Service: Gastroenterology    TUMOR REMOVAL      multiple-head, neck, back, hand, right flank, multiple    Family History   Problem Relation Age of Onset    No Known Problems Mother     No Known Problems Father     No Known Problems Sister     No Known Problems Brother     Stroke Maternal Grandmother     Other Maternal Grandmother         benign lesions of liver and pancreas, further details unknown    Cancer Maternal Grandmother     Diabetes Maternal Grandmother     Hypertension Maternal Grandmother     Thyroid disease Maternal Grandmother     Arthritis Maternal Grandfather     Asthma Maternal Grandfather     COPD Paternal Grandmother         Deceased    Miscarriages / Stillbirths Paternal Grandmother     Alcohol abuse Paternal Grandfather         Deceased    No Known Problems Maternal Aunt     No Known Problems Maternal Uncle     No Known Problems Paternal Aunt     No Known Problems Paternal Uncle     Anesthesia problems Neg Hx     Broken bones Neg Hx     Cancer Neg Hx     Clotting disorder Neg Hx     Collagen disease Neg Hx     Diabetes Neg Hx     Dislocations Neg Hx     Fibromyalgia Neg Hx     Gout Neg Hx     Hemophilia Neg Hx     Osteoporosis Neg Hx     Rheumatologic disease Neg Hx     Scoliosis Neg Hx     Severe sprains Neg Hx     Sickle cell anemia Neg Hx     Spinal Compression Fracture Neg Hx     Melanoma Neg Hx     Basal cell carcinoma Neg Hx     Squamous cell carcinoma Neg Hx         Adhesive tape-silicones; Ferrlecit [sodium ferric gluconat-sucrose]; Levofloxacin; Methylnaltrexone; Neomycin; Papaya; Morphine; Zosyn [piperacillin-tazobactam]; Compazine [prochlorperazine]; Iron analogues; Iron dextran; and Latex, natural rubber     Objective Findings  Patient / Caregiver reports: I'm really stiff    Precautions / Restrictions  Falls precautions       Weight Bearing  Non-applicable    Required Braces or Orthoses  Non-applicable    Communication Preference  Verbal    Pain  Pt endorsed intermittent post procedural pain in stomach. Endorsed relief in upright position in bed. All activity adjusted per tolerance.    Equipment / Environment  Vascular access (PIV, TLC, Port-a-cath, PICC), Supplemental oxygen, Telemetry, NGT     Functional Mobility  Transfers: Min assist (HHA)  Bed Mobility - Needs Assistance: Mod assist, Min assist (Sup > sit with Min A. sit > sup with Mod A for BLE)  Ambulation: Pt completed short room functional mobiltiy with CGA + increased time.    ADLs  ADLs: Needs assistance with ADLs  ADLs - Needs Assistance: LB dressing, Bathing, Grooming, Toileting, UB dressing  Feeding - Needs Assistance: Set Up Assist  Grooming -  Needs Assistance: Performed seated, Supervision  Bathing - Needs Assistance: Mod assist, Performed seated  Toileting - Needs Assistance: Min assist, Performed seated  UB Dressing - Needs Assistance: Contact Guard assist  LB Dressing - Needs Assistance: Performed seated, Max assist    Vitals / Orthostatics  Vitals/Orthostatics: VSS    Cognition   Orientation Level:  Oriented x 4   Arousal/Alertness:  Appropriate responses to stimuli   Attention Span:  Appears intact   Memory:  Appears intact   Following Commands:  Follows all commands and directions without difficulty   Safety Judgment:  Good awareness of safety precautions   Awareness of Errors and Problem Solving:  Patient self-corrected errors   Comments:      Vision / Hearing   Vision: No acute deficits identified       Hearing: No deficit identified         Hand Function:  Right Hand Function: Right hand grip strength, ROM and coordination WNL  Left Hand Function: Left hand grip strength, ROM and coordination WNL  Hand Dominance: Unknown    Skin Inspection:       ROM / Strength:  UE ROM/Strength: Right Impaired/Limited, Left Impaired/Limited  RUE Impairment: Pain with movement  LUE Impairment: Pain with movement  UE ROM/ Strength Comment: notable guarding with L shoulder, requiring cues to relax. Pt reported pain with movement in BUE 2/2 recent procedures, able to complete full AROM despite pain    LE ROM/Strength: Left Impaired/Limited, Right Impaired/Limited  RLE Impairment: Reduced strength  LLE Impairment: Reduced strength  LE ROM/ Strength Comment: Pt with ongoing ridigity and stiffness that worsens with activity. Able to use BLE to bridge hips in bed.    Coordination:  Coordination: Not tested    Sensation:  Sensory/ Proprioception/ Stereognosis comments: Endorsed nerve feeling and tingling in L hand that comes and goes in certain positions recently. Pt reported ongoing numbness in R hand on ulnar side for ~1 year.    Balance:  Static Sitting-Level of Assistance: Contact guard  Dynamic Sitting-Level of Assistance: Contact guard    Static Standing-Level of Assistance: Contact guard  Dynamic Standing - Level of Assistance: Contact guard         Patient at end of session: All needs in reach, Friends/Family present, In bed, Lines intact     Occupational Therapy Session Duration  OT Individual [mins]: 37         I attest that I have reviewed the above information.  Signed: Karna Christmas Alano Blasco, OT  Filed 09/28/2022    The care for this patient was completed by Marijo Conception, OT:  A student was present and participated in the care. Licensed/Credentialed therapist was physically present and immediately available to direct and supervise tasks that were related to patient management. The direction and supervision was continuous throughout the time these tasks were performed.    Marijo Conception, OT

## 2022-09-30 LAB — COMPREHENSIVE METABOLIC PANEL
ALBUMIN: 2.8 g/dL — ABNORMAL LOW (ref 3.4–5.0)
ALKALINE PHOSPHATASE: 57 U/L (ref 46–116)
ALT (SGPT): 8 U/L — ABNORMAL LOW (ref 10–49)
ANION GAP: 4 mmol/L — ABNORMAL LOW (ref 5–14)
AST (SGOT): 14 U/L (ref <=34)
BILIRUBIN TOTAL: 0.4 mg/dL (ref 0.3–1.2)
BLOOD UREA NITROGEN: 12 mg/dL (ref 9–23)
BUN / CREAT RATIO: 24
CALCIUM: 8.5 mg/dL — ABNORMAL LOW (ref 8.7–10.4)
CHLORIDE: 110 mmol/L — ABNORMAL HIGH (ref 98–107)
CO2: 28 mmol/L (ref 20.0–31.0)
CREATININE: 0.49 mg/dL — ABNORMAL LOW
EGFR CKD-EPI (2021) FEMALE: >90 mL/min/{1.73_m2} (ref >=60)
GLUCOSE RANDOM: 101 mg/dL (ref 70–179)
POTASSIUM: 3.7 mmol/L (ref 3.4–4.8)
PROTEIN TOTAL: 5.4 g/dL — ABNORMAL LOW (ref 5.7–8.2)
SODIUM: 142 mmol/L (ref 135–145)

## 2022-09-30 LAB — CBC
HEMATOCRIT: 29.7 % — ABNORMAL LOW (ref 34.0–44.0)
HEMOGLOBIN: 9.7 g/dL — ABNORMAL LOW (ref 11.3–14.9)
MEAN CORPUSCULAR HEMOGLOBIN CONC: 32.5 g/dL (ref 32.0–36.0)
MEAN CORPUSCULAR HEMOGLOBIN: 26.2 pg (ref 25.9–32.4)
MEAN CORPUSCULAR VOLUME: 80.5 fL (ref 77.6–95.7)
MEAN PLATELET VOLUME: 9.2 fL (ref 6.8–10.7)
PLATELET COUNT: 199 10*9/L (ref 150–450)
RED BLOOD CELL COUNT: 3.69 10*12/L — ABNORMAL LOW (ref 3.95–5.13)
RED CELL DISTRIBUTION WIDTH: 12.5 % (ref 12.2–15.2)
WBC ADJUSTED: 6.2 10*9/L (ref 3.6–11.2)

## 2022-09-30 LAB — PHOSPHORUS: PHOSPHORUS: 3.5 mg/dL (ref 2.4–5.1)

## 2022-09-30 LAB — MAGNESIUM: MAGNESIUM: 2 mg/dL (ref 1.6–2.6)

## 2022-09-30 MED ADMIN — methocarbamol (ROBAXIN) 250 mg in sodium chloride (NS) 0.9 % 50 mL IVPB: 250 mg | INTRAVENOUS | @ 01:00:00 | Stop: 2022-10-01 | NDC 71288071611

## 2022-09-30 MED ADMIN — buprenorphine-naloxone (SUBOXONE) 2-0.5 mg SL film 0.5 mg of buprenorphine: .25 | SUBLINGUAL | @ 12:00:00 | NDC 52427069211

## 2022-09-30 MED ADMIN — HYDROmorphone (PF) injection Syrg 0.2 mg: .2 mg | INTRAVENOUS | @ 18:00:00 | Stop: 2022-09-30 | NDC 00548194010

## 2022-09-30 MED ADMIN — famotidine (PF) (PEPCID) injection 20 mg: 20 mg | INTRAVENOUS | @ 01:00:00 | NDC 96619043921

## 2022-09-30 MED ADMIN — ondansetron (ZOFRAN) injection 4 mg: 4 mg | INTRAVENOUS | @ 15:00:00 | NDC 71930001752

## 2022-09-30 MED ADMIN — lactated Ringers infusion: 50 mL/h | INTRAVENOUS | NDC 11845118709

## 2022-09-30 MED ADMIN — pantoprazole (Protonix) injection 40 mg: 40 mg | INTRAVENOUS | @ 01:00:00 | NDC 82009001190

## 2022-09-30 MED ADMIN — buprenorphine-naloxone (SUBOXONE) 2-0.5 mg SL film 0.5 mg of buprenorphine: .25 | SUBLINGUAL | @ 01:00:00 | NDC 52427069211

## 2022-09-30 MED ADMIN — acetaminophen (OFIRMEV) 10 mg/mL injection 1,000 mg: 1000 mg | INTRAVENOUS | @ 05:00:00 | Stop: 2022-09-30 | NDC 68094059462

## 2022-09-30 MED ADMIN — pantoprazole (Protonix) injection 40 mg: 40 mg | INTRAVENOUS | @ 12:00:00 | NDC 82009001190

## 2022-09-30 MED ADMIN — lidocaine 2000 mg in dextrose 5 % 250 mL (8 mg/mL) infusion PMB: 1 mg/kg/h | INTRAVENOUS | @ 16:00:00 | Stop: 2022-10-01 | NDC 63323048457

## 2022-09-30 MED ADMIN — lactated Ringers infusion: 50 mL/h | INTRAVENOUS | @ 03:00:00 | NDC 11845118709

## 2022-09-30 MED ADMIN — methocarbamol (ROBAXIN) 250 mg in sodium chloride (NS) 0.9 % 50 mL IVPB: 250 mg | INTRAVENOUS | @ 21:00:00 | Stop: 2022-10-02 | NDC 71288071611

## 2022-09-30 MED ADMIN — buprenorphine-naloxone (SUBOXONE) 2-0.5 mg SL film 0.5 mg of buprenorphine: .25 | SUBLINGUAL | @ 16:00:00 | NDC 52427069211

## 2022-09-30 MED ADMIN — LORazepam (ATIVAN) injection 0.5 mg: .5 mg | INTRAVENOUS | @ 08:00:00 | NDC 76329826101

## 2022-09-30 MED ADMIN — ondansetron (ZOFRAN) injection 4 mg: 4 mg | INTRAVENOUS | @ 08:00:00 | NDC 71930001752

## 2022-09-30 MED ADMIN — promethazine (PHENERGAN) injection 6.25 mg: 6.25 mg | INTRAVENOUS | @ 10:00:00 | NDC 60977000143

## 2022-09-30 MED ADMIN — Parenteral Nutrition (CENTRAL): INTRAVENOUS | @ 02:00:00 | Stop: 2022-09-30 | NDC 96295013582

## 2022-09-30 MED ADMIN — LORazepam (ATIVAN) injection 0.5 mg: .5 mg | INTRAVENOUS | @ 15:00:00 | NDC 76329826101

## 2022-09-30 MED ADMIN — buprenorphine-naloxone (SUBOXONE) 2-0.5 mg SL film 0.5 mg of buprenorphine: .25 | SUBLINGUAL | @ 21:00:00 | NDC 52427069211

## 2022-09-30 MED ADMIN — methocarbamol (ROBAXIN) 250 mg in sodium chloride (NS) 0.9 % 50 mL IVPB: 250 mg | INTRAVENOUS | @ 15:00:00 | Stop: 2022-10-02 | NDC 71288071611

## 2022-09-30 MED ADMIN — promethazine (PHENERGAN) injection 6.25 mg: 6.25 mg | INTRAVENOUS | @ 03:00:00 | NDC 60977000143

## 2022-09-30 MED ADMIN — lidocaine 2000 mg in dextrose 5 % 250 mL (8 mg/mL) infusion PMB: 1 mg/kg/h | INTRAVENOUS | @ 02:00:00 | Stop: 2022-09-30 | NDC 63323048457

## 2022-09-30 MED ADMIN — methocarbamol (ROBAXIN) 250 mg in sodium chloride (NS) 0.9 % 50 mL IVPB: 250 mg | INTRAVENOUS | @ 08:00:00 | Stop: 2022-10-02 | NDC 71288071611

## 2022-09-30 MED ADMIN — HYDROmorphone (PF) injection Syrg 0.2 mg: .2 mg | INTRAVENOUS | @ 12:00:00 | Stop: 2022-09-30 | NDC 00548194010

## 2022-09-30 MED ADMIN — promethazine (PHENERGAN) injection 6.25 mg: 6.25 mg | INTRAVENOUS | @ 19:00:00 | NDC 60977000143

## 2022-09-30 NOTE — Unmapped (Signed)
Luminal Gastroenterology Consult Service   Progress Note         Assessment and Recommendations:   [name] [name] is a 22 y.o. 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 following VIR guided desmoid tumor injection due to uncontrolled pain and muscle spasms/rigidity. The patient is seen in consultation at the request of Rocco Serene, MD (Med [facility])) for concern for coffee ground emesis and ileus.     Concern for Coffee-Ground Emesis   Patient with one day of coffee-ground emesis from NG tube on 7/21. NG tube output examined today with non-bloody clear yellow output. We suspect that the coffee-ground emesis seen earlier during admission was related to induced trauma from the NG tube with self-resolution. We are less concerned for alternative sources of an upper GIB (peptic ulcer, AMV, dieulafoy's lesion, etc) as recent EGD 01/2022 was largely unremarkable. Since she remains hemodynamically stable, her hemoglobin also remains stable at ~10, and given her prior challenges with anesthesia, we recommend ongoing conservative management. If she develops major bleeding with hemodynamic instability, we would consider a repeat upper endoscopy at that time.   - Continue BID PPI   - Attempt to remove NG as soon as able to minimize risk of trauma   - Avoid NSAIDS  - Daily CBC    Ileus - Chronic abdominal pain  Patient with history of recurrent ileus and chronic abdominal pain, likely visceral hypersensitivity and opioid induced hyperalgesia. CT a/p 7/19 that was obtained in the setting of a rapid response showed multiple dilated air and fluid containing bowel loops without a discrete transition point, indicative of ileus. Historical CT scans reviewed with evidence of chronic small bowel dilation. Abdominal exam subjectively distended with some diminished bowel sounds, otherwise reassuring. We suspect that her ileus is secondary to opioids which she has been receiving for her acute on chronic abdominal pain.   - Given abdominal XR without improvement and no bowel movement on 7/22, recommend trial of gut directed opioid antagonist.  --> Subcutaneous methylnaltrexone was previously not well tolerated.   --> Recommend trial of oral naloxegol   - Do not recommend Neostigmine, which generally is more effective for large bowel.  - Can try Reglan, but generally this is more directed towards the stomach.   - Continue supportive management with ongoing NG tube decompression, minimization of narcotics, and physical activity as able.   - Agree with consult to Chronic Pain team     Issues Impacting Complexity of Management:  -None    Recommendations discussed with the patient's primary team. We will continue to follow along with you.    Subjective:   Interval events: No further episodes of coffee ground emesis. Minimal NGT output. No bowel movement in the last 24 hours. Patient on lidocaine drip for pain, concerned about addition of methylnaltrexone given prior adverse reaction. Remains on TPN.     Objective:   Temp:  [36.6 ??C (97.9 ??F)-37.1 ??C (98.7 ??F)] 36.9 ??C (98.4 ??F)  Heart Rate:  [71-88] 78  SpO2 Pulse:  [71-77] 77  Resp:  [12-18] 12  BP: (92-121)/(42-57) 121/54  SpO2:  [98 %-99 %] 99 %      Pertinent Labs/Studies:    Recent Labs     Units 09/28/22  1406 09/29/22  0506 09/29/22  1041   WBC 10*9/L 6.4 6.7 5.9   RBC 10*12/L 3.87* 3.84* 3.80*   HGB g/dL 96.0* 45.4* 09.8*   HCT % 30.9* 31.2* 30.9*   MCV fL 79.8 81.1 81.3   MCH pg 27.1 26.3 26.4   MCHC g/dL 11.9 14.7 82.9   RDW % 12.4 12.4 12.7   PLT 10*9/L 223 216 203   MPV fL 9.3 9.4 9.3     Recent Labs     Units 09/26/22  1044 09/27/22  0417 09/28/22  0454 09/29/22  0506   NA mmol/L 139 141 142 142   K mmol/L 3.6 3.5 3.2* 3.6   CL mmol/L 110* 111* 109* 110*   CO2 mmol/L 21.0 26.0 30.0 28.0   BUN mg/dL <5* <5* <5* 8*   CREATININE mg/dL 5.62* 1.30 8.65 7.84   GLU mg/dL 86 89 99 696   CALCIUM mg/dL 7.8* 8.7 8.7 8.4*   ALBUMIN g/dL 2.4*  --  3.1* 3.0*   PROT g/dL 4.5*  --  5.7 5.8   BILITOT mg/dL <2.9*  --  0.3 0.4   ALKPHOS U/L 56  --  64 63   ALT U/L 16  --  14 14   AST U/L 14  --  13 17     KUB 09/29/22:  Stable compared to yesterday's exam      KUB 09/28/22  Multiple air-filled dilated loops of bowel in the central abdomen appear similar to slightly decreased in caliber. Given that the enteric contrast is now located within the region of the ileoanal anastomosis, findings are favored to represent ileus.     CT a/p with contrast 09/27/2022   Similar-appearing bowel pattern with multiple dilated air and fluid containing bowel loops without a discrete transition point which may be indicative of ileus.      Otherwise stable abdominopelvic findings compared to 09/26/2022     Pouchoscopy 03/2022  The perianal and digital rectal examinations were normal.       The neo-terminal ileum appeared normal.       A localized area of mucosa in the ileoanal pouch was mildly friable with        contact bleeding along the proximal suture lines. Coagulation for        bleeding prevention using argon beam at 0.3 liters/minute and 20 watts        was successful.       There was a rectal cuff beginning at at 1 cm from the anal verge,        characterized by healthy appearing mucosa. The cuff extended 1 cm in        length.    EGD 01/2022  No evidence of blood or source of hematochezia seen on upper endoscopy.       The esophagus was normal.       Multiple pedunculated and sessile polyps with no bleeding and no        stigmata of recent bleeding were found in the entire examined stomach.        Previously biopsied and consistent with adenomas       The examined duodenum was normal.       The examined jejunum was normal.

## 2022-09-30 NOTE — Unmapped (Addendum)
Hospital Medicine Daily Progress Note    Assessment/Plan:    Principal Problem:    Other acute postprocedural pain  Active Problems:    [name]    Intestinal polyps    [name]    Neoplasm related pain    Physical deconditioning    History of colectomy    Intractable abdominal pain    Generalized anxiety disorder with panic attacks    SBO (small bowel obstruction) (CMS-HCC)    Small intestinal bacterial overgrowth (SIBO)  Resolved Problems:    * No resolved hospital problems. *   Malnutrition Evaluation as performed by RD, LDN: Patient does not meet AND/ASPEN criteria for malnutrition at this time (09/29/22 1439)             [name] [name] is a 22 y.o. female that presented to [facility] with Other acute postprocedural pain.  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    Muscle Spasms   -Patient was undergoing IR guided procedure for desmoid tumors which included steroid injection under ultrasound guidance  -Afterwards developed severe pain and shaking/rigidity  -She was admitted due to uncontrolled pain and rigidity  -Discussed with neurology, MRI spine returned unrevealing.  Plan:  -Pain management with IV tylenol for now and lidocaine infusion -- appreciate Chronic Pain assistance. -Enteral opiates and IV dilaudid held while on lidocaine infusion and ileus.   -IV robaxin for course <72 hours  -IV ativan in place of home PO ativan    Ileus  Nausea and vomiting  -Seen on CT as above  -CT with oral contrast negative for bowel obstruction  -KUB today with persistent ileus, obtain KUB daily  -NG tube to low intermittent suction  -IV to PO medications as she improves  -IVF maintenance for now with D5LR at 50 ml while also on peripheral nutrition at 40 ml/hr  -Nutrition consult for peripheral nutrition  -Limiting opiates as tolerated -- on lidocaine infusion  -Stopped reglan scheduled IV after 1 dose due to patient stating it caused pain and agitation, continue IV phenergan  -If no improvement in ileus and ability to tolerate PO, would consider methylnaltrexone and or neostigmine but both of these agents have risks of adverse reactions such as bowel perf and obstruction in setting of GI desmoid and history of obstruction with methylnaltrexone and cardiac arrhythmia with neostigmine     Coffee Ground Emesis  NGT irritation?  -Noted in NG tube this morning  -Hgb stable around 10 with no hemodynamic instability  -She is on IV PPI BID and has history of ulcers  -Discussing with Gastroenterology, given acuity of illnesses, medical management at this time given risk and benefits of EGD  -Type and screen -- maintain active  -No chemical DVT ppx, only SCDs    Transient Lactic Acidosis, type B?  -Lactate was transient and now resolved -- possible elevated due to episode of muscle rigidity similarly in tonic clonic seizures although patient did not have signs of seizures and was alert during episodes. Concerned this is reaction to procedure but medication etiology unclear versus transient bowel obstruction/compression from ileus?  -Due to chills and lactic acidosis, broad spectrum antibiotics were started however she was narrowed to ceftriaxone today after MRSA nares negative and no other signs of infection on objective data and clinically. If she does have not culture data showing infection after 48 hours -- stopped ceftriaxone and monitoring clinically.   -Follow-up on blood cultures and  urine culture -- currently no growth    [name]  -Currently holding home nirogacest after discussion with hematology and oncology/solid tumor teamat     Small intestinal bacterial overgrowth  -History of colectomy  -Holding home cefdinir and doxycycline for SIBO in setting of broad-spectrum antibiotics    History of desmoid tumors  -On Nirogacestat and intermittent steroid injections with VIR  -GI desmoids involvement    Prophylaxis  -Ambulation     Diet  -Nutrition Therapy Regular/House     Code Status / HCDM   -Full Code, Unverified but presumed, based on previous history   -  HCDM [facility] policy, no known patient preference): [name],[name] - Mother - [phone]    HCDM, back-up (If primary HCDM is unavailable): Colter,Jeff - Father - [phone]      I personally spent 75 minutes face-to-face and non-face-to-face in the care of this patient, which includes all pre, intra, and post visit time on the date of service.  All documented time was specific to the E/M visit and does not include any procedures that may have been performed.    ___________________________________________________________________    Subjective:  Patient having nausea and vomiting still. Pain and spasms remain present but somewhat improved.       Labs/Studies:  Labs and Studies from the last 24hrs per EMR and Reviewed    Objective:  Temp:  [36.6 ??C (97.9 ??F)-37.1 ??C (98.7 ??F)] 36.7 ??C (98 ??F)  Heart Rate:  [71-88] 78  SpO2 Pulse:  [71-77] 77  Resp:  [11-18] 12  BP: (92-121)/(42-57) 121/54  SpO2:  [98 %-99 %] 99 %    Cardiovascular: Regular rhythm, tachycardic, no murmurs  Lungs: No wheezes, no rales, no rhonchi  Neurological: no myoclonus, no flexural posturing, no hyperreflexia  Abdomen: Tender to palpation in all quadrants, no rebounding or guarding

## 2022-09-30 NOTE — Unmapped (Signed)
Department of Anesthesiology  Pain Medicine Division    Chronic Pain Followup Inpatient Consult Note    Requesting Attending Physician:  [name], MD  Service Requesting Consult:  Med Bernita Raisin [facility])    Assessment/Recommendations:    The patient was seen in consultation on request of Rocco Serene, MD regarding assistance with pain management for patient's chronic intractable abdominal pain with now acute on chronic recurrent ileus/NG tube placement.  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      Patient currently established with [facility] pain management center Dr. Dyann Ruddle, has been on oxycodone 10 mg 3 times daily, Suboxone 0.5 mg 3 times daily along with baclofen and pregabalin.  Through her psychiatrist she is on Ativan 0.5 mg 3 times daily as needed.      Patient has had multiple prior inpatient stays either regarding acute exacerbation of chronic pain or ileus.  In the past 12 months she has had 2 infusions of ketamine with limited benefit (last infusion in May 2024) and at one point there was some transaminitis which was thought to be attributed from other etiologies like TPN versus other intra-abdominal pathologies.  In the past patient also has had lidocaine infusions during inpatient stay with limited/variable benefit.           Regarding ileus and her constipation, After discussion with the patient and mother we came to an agreement to attempt with a low-dose of naloxegol 12.5 mg, she has taken enterag around surgery time before and seemed to tolerate it. GI team recommended against neostigmine at this time. I would expect the naloxegol to increase her abdominal pain at first but hope that we can continue it and even increase to 25mg  if able.     This is no doubt a very complex situation. Her biggest issue at this point seems to be the ileus. Medication management should focus on non-opioid medications, although she has trialed others in past with side effects or minimal benefit.  Hopefully minimizing opioids and working on her ileus will help get her out of the hospital quicker than previously.           Interval: She is overall stable, after discussion with GI, will plan to prolong the lidocaine infusion one additional day. We dicussed mobility and trying to be move around as best she can and doing bed level exercises to help her bowels. GI will start naloxegol.       Recommendations:  -The chronic pain service is a consult service and does not place orders, just makes recommendations (except ketamine and lidocaine infusions)   -Please evaluate all patients on opioids for appropriateness of prescribing narcan at discharge.  The chronic pain service can assist with this.  Nasal narcan is covered by most insurances.  -Recommendations given apply to the current hospitalization and do not reflect long term recommendations.    -Recommend to continue lidocaine infusion.  Please follow recommendation as follow  -Patient is on a low dose lidocaine infusion for pain              -All lidocaine orders will be managed by the [facility] Chronic Pain Service only              -Lidocaine requires dedicated IV (PIV or central line lumen)              -continue  lidocaine infusion at 1mg /kg/hr (ideal body weight)              -Patient location: dose can be increased no sooner than every 4 hours              -Day 2 (max-3 days trial). Started (date): 09/28/22 - 10/01/22              -Prior to starting: normal recent EKG 09/26/22 - normal, Reviewed 09/28/22 ( normal creatinine, LFTs), urine pregnancy test negative 09/26/22              -Daily CMP (creatinine and LFTs daily) while running              -Lipid emulsion available PRN: Local Anesthetic Systemic Toxicity              -Please contact the Chronic Pain service with any questions or concerns about lidocaine infusion. Agree with GI for starting naloxegol 12.5 mg p.o. daily.  GI following      -Restart home meds whenever able to take PO medications.  Could schedule baclofen and increase pregabalin.    - recommend physical therapy to work on bed level mobility          We will continue to follow.    Naloxone Rx at discharge?  Is patient on opioids? Yes.  1)Is dose >50MME?  Yes.  2) Is patient prescribed a benzodiazepine (w opioids)? Yes.  3)Hx of overdose?  No.  4) Hx of substance use disorder? No.  5) Opioids likely to last greater than a week after discharge? Yes.       If yes to 2 or more, prescribe naloxone at discharge.  Nasal narcan for most insured (Nasal narcan 4mg /actuation, prescribe 1 kit, instructions at [email or web address]).  For uninsured, chronic pain can work to assist in finding an option.  OTC nasal narcan now available at most pharmacies for around $45.    Interim History  There were no Acute Events Overnight.  The patient is not obtaining adequate pain relief on current medication regimen and feels that their pain is Somewhat controlled    Today patient reports pain is somewhat controlled but is better since starting the Lidocaine infusion. She shared her frustration with her situation and is anxious about stopping the lidocaine.     Analgesia Evaluation:  Pain at minimum: 5/10  Pain at maximum: 8/10    Current pain medication regimen (including how frequent PRN's were used):  Suboxone 2-0.5mg  TID   Lidocaine infusion 1mg /kg/hr 48 hours   Tylenol IV   Baclofen 10mg  TID on hold   Robaxin IV 250mg  TID   Oxycodone 15mg  q4h PRN (on hold)     Inpatient Medications  Current Facility-Administered Medications   Medication Dose Route Frequency Provider Last Rate Last Admin    [Provider Hold] baclofen (LIORESAL) tablet 10 mg  10 mg Oral TID PRN Rocco Serene, MD   10 mg at 09/27/22 1639    buprenorphine-naloxone (SUBOXONE) 2-0.5 mg SL film 0.5 mg of buprenorphine  0.25 Film Sublingual TID Terri Piedra, AGNP 0.5 mg of buprenorphine at 09/30/22 1225    calcium carbonate (TUMS) chewable tablet 400 mg elem calcium  400 mg elem calcium Oral Daily PRN Terri Piedra, AGNP        [Provider Hold] cefdinir (OMNICEF) capsule 300 mg  300 mg Oral Q12H [facility] Rocco Serene, MD        [Provider Hold] doxycycline (VIBRA-TABS) tablet 100 mg  100 mg Oral BID Rocco Serene, MD        famotidine (PF) (PEPCID) injection 20 mg  20 mg Intravenous At bedtime Rocco Serene, MD   20 mg at 09/29/22 2055    fat emulsion 20 % (INTRAlipid) infusion 100 mL  100 mL Intravenous Once PRN Lobonc, Ammie Ferrier, MD        fat emulsion 20 % (INTRAlipid) infusion 200 mL  200 mL Intravenous Once PRN Lobonc, Ammie Ferrier, MD        HYDROmorphone (PF) injection Syrg 0.2 mg  0.2 mg Intravenous Once [name], MD        lactated Ringers infusion  50 mL/hr Intravenous Continuous Rocco Serene, MD 50 mL/hr at 09/29/22 2328 50 mL/hr at 09/29/22 2328    lidocaine 2000 mg in dextrose 5 % 250 mL (8 mg/mL) infusion PMB  1 mg/kg/hr (Ideal) Intravenous Continuous Ramaya Guile E, DO 7.1 mL/hr at 09/30/22 1225 1 mg/kg/hr at 09/30/22 1225    LORazepam (ATIVAN) injection 0.5 mg  0.5 mg Intravenous Q6H PRN Rocco Serene, MD   0.5 mg at 09/30/22 1030    [Provider Hold] LORazepam (ATIVAN) tablet 0.5 mg  0.5 mg Oral Q6H PRN Rocco Serene, MD        [Provider Hold] melatonin tablet 3 mg  3 mg Oral Nightly PRN Terri Piedra, AGNP        methocarbamol (ROBAXIN) 250 mg in sodium chloride (NS) 0.9 % 50 mL IVPB  250 mg Intravenous Q8H [name], MD   Stopped at 09/30/22 1103    [Provider Hold] mirtazapine (REMERON) tablet 15 mg  15 mg Oral Nightly Terri Piedra, AGNP   15 mg at 09/26/22 2316    ondansetron (ZOFRAN) injection 4 mg  4 mg Intravenous Q6H PRN Terri Piedra, AGNP   4 mg at 09/30/22 1030    [Provider Hold] oxyCODONE (ROXICODONE) immediate release tablet 15 mg  15 mg Oral Q4H PRN Terri Piedra, AGNP   15 mg at 09/27/22 1410    pantoprazole (Protonix) injection 40 mg  40 mg Intravenous BID Rocco Serene, MD   40 mg at 09/30/22 1610    Parenteral Nutrition (CENTRAL)   Intravenous Continuous [name], MD        Parenteral Nutrition (CENTRAL)   Intravenous Continuous [name], MD        phenol (CHLORASEPTIC) 1.4 % spray 2 spray  2 spray Mucous Membrane Q2H PRN Rocco Serene, MD   2 spray at 09/27/22 1221    [Provider Hold] polyethylene glycol (MIRALAX) packet 17 g  17 g Oral Daily PRN Terri Piedra, AGNP        promethazine (PHENERGAN) injection 6.25 mg  6.25 mg Intravenous Q6H PRN Rocco Serene, MD   6.25 mg at 09/30/22 0601         Objective:     Vital Signs    Temp:  [36.5 ??C (97.7 ??F)-36.9 ??C (98.4 ??F)] 36.5 ??C (97.7 ??F)  Heart Rate:  [63-78] 75  SpO2 Pulse:  [63-77] 73  Resp:  [10-14] 14  BP: (100-121)/(40-55) 111/40  MAP (mmHg):  [60-72] 63  SpO2:  [98 %-99 %] 99 %  BMI (Calculated):  [22.8] 22.8      Physical Exam     GENERAL:  Well developed, well-nourished female and looking uncomfortable from pain and NG tube discomfort.   HEAD/NECK:    Reveals normocephalic/atraumatic.  CARDIOVASCULAR:   Regular rate  LUNGS:   Normal work of breathing, no supplemental 02  EXTREMITIES:  Warm, no clubbing, cyanosis, or edema was noted.  NEUROLOGIC:    The patient was alert and oriented times four with normal language, attention, cognition and memory. Cranial nerve exam was grossly normal.    MUSCULOSKELETAL:    SKIN:  No obvious rashes lesions or erythema  PSY:  Appropriate affect and mood.    Test Results    Lab Results   Component Value Date    CREATININE 0.49 (L) 09/30/2022     Lab Results   Component Value Date    ALKPHOS 57 09/30/2022    BILITOT 0.4 09/30/2022    BILIDIR <0.10 08/08/2022    PROT 5.4 (L) 09/30/2022    ALBUMIN 2.8 (L) 09/30/2022    ALT 8 (L) 09/30/2022    AST 14 09/30/2022           Problem List    Principal Problem:    Other acute postprocedural pain  Active Problems:    [name]    Intestinal polyps    [name]    Neoplasm related pain    Physical deconditioning    History of colectomy    Intractable abdominal pain    Generalized anxiety disorder with panic attacks    SBO (small bowel obstruction) (CMS-HCC)    Small intestinal bacterial overgrowth (SIBO)      [place], DO   PGY-5 Pain Fellow

## 2022-09-30 NOTE — Unmapped (Signed)
Luminal Gastroenterology Consult Service   Progress Note         Assessment and Recommendations:   [name] [name] is a 22 y.o. 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 The patient is seen in consultation at the request of [name], MD (Med [place] [facility])) for concern for coffee ground emesis and ileus.     Concern for Coffee-Ground Emesis   Patient with one day of coffee-ground emesis from NG tube on 7/21. NG tube output examined today with non-bloody clear yellow output. We suspect that the coffee-ground emesis seen earlier during admission was related to induced trauma from the NG tube with self-resolution. We are less concerned for alternative sources of an upper GIB (peptic ulcer, AMV, dieulafoy's lesion, etc) as recent EGD 01/2022 was largely unremarkable. Since she remains hemodynamically stable, her hemoglobin also remains stable at ~10, and given her prior challenges with anesthesia, we recommend ongoing conservative management. If she develops major bleeding with hemodynamic instability, we would consider a repeat upper endoscopy at that time.   - Continue BID PPI   - Attempt to remove NG as soon as able to minimize risk of trauma   - Avoid NSAIDS  - Daily CBC    Ileus - Chronic abdominal pain  Patient with history of recurrent ileus and chronic abdominal pain, likely visceral hypersensitivity and opioid induced hyperalgesia. CT a/p 7/19 that was obtained in the setting of a rapid response showed multiple dilated air and fluid containing bowel loops without a discrete transition point, indicative of ileus. Historical CT scans reviewed with evidence of chronic small bowel dilation. Abdominal exam subjectively distended with some diminished bowel sounds, otherwise reassuring. We suspect that her ileus is secondary to opioids which she has been receiving for her acute on chronic abdominal pain.   - Given abdominal XR without improvement and no bowel movement on 7/22, recommend trial of gut directed opioid antagonist.  --> Subcutaneous methylnaltrexone was previously not well tolerated.   --> Recommend trial of oral naloxegol 12.5 mg daily  - Do not recommend Neostigmine, which generally is more effective for large bowel.  - Can try Reglan, but generally this is more directed towards the stomach.   - Continue supportive management with ongoing NG tube decompression, minimization of narcotics, and physical activity as able.   - Agree with consult to Chronic Pain team     Issues Impacting Complexity of Management:  -None    Recommendations discussed with the patient's primary team. We will continue to follow along with you.    Subjective:   Interval events: No further episodes of coffee ground emesis. Minimal NGT output. Reports significant nausea and abdominal pain disrupting sleep overnight. No bowel movement in the last 24 hours (last 7/19). Remains on TPN.     Objective:   Temp:  [36.5 ??C (97.7 ??F)-36.9 ??C (98.4 ??F)] 36.5 ??C (97.7 ??F)  Heart Rate:  [63-78] 75  SpO2 Pulse:  [63-77] 73  Resp:  [10-14] 14  BP: (100-121)/(40-55) 111/40  SpO2:  [98 %-99 %] 99 %      Pertinent Labs/Studies:    Recent Labs     Units 09/29/22  0506 09/29/22  1041  09/30/22  0613   WBC 10*9/L 6.7 5.9 6.2   RBC 10*12/L 3.84* 3.80* 3.69*   HGB g/dL 56.2* 13.0* 9.7*   HCT % 31.2* 30.9* 29.7*   MCV fL 81.1 81.3 80.5   MCH pg 26.3 26.4 26.2   MCHC g/dL 86.5 78.4 69.6   RDW % 12.4 12.7 12.5   PLT 10*9/L 216 203 199   MPV fL 9.4 9.3 9.2     Recent Labs     Units 09/28/22  0454 09/29/22  0506 09/30/22  0613   NA mmol/L 142 142 142   K mmol/L 3.2* 3.6 3.7   CL mmol/L 109* 110* 110*   CO2 mmol/L 30.0 28.0 28.0   BUN mg/dL <5* 8* 12   CREATININE mg/dL 2.95 2.84 1.32*   GLU mg/dL 99 440 102   CALCIUM mg/dL 8.7 8.4* 8.5*   ALBUMIN g/dL 3.1* 3.0* 2.8*   PROT g/dL 5.7 5.8 5.4*   BILITOT mg/dL 0.3 0.4 0.4   ALKPHOS U/L 64 63 57   ALT U/L 14 14 8*   AST U/L 13 17 14      KUB 09/29/22:  Stable compared to yesterday's exam      KUB 09/28/22  Multiple air-filled dilated loops of bowel in the central abdomen appear similar to slightly decreased in caliber. Given that the enteric contrast is now located within the region of the ileoanal anastomosis, findings are favored to represent ileus.     CT a/p with contrast 09/27/2022   Similar-appearing bowel pattern with multiple dilated air and fluid containing bowel loops without a discrete transition point which may be indicative of ileus.      Otherwise stable abdominopelvic findings compared to 09/26/2022     Pouchoscopy 03/2022  The perianal and digital rectal examinations were normal.       The neo-terminal ileum appeared normal.       A localized area of mucosa in the ileoanal pouch was mildly friable with        contact bleeding along the proximal suture lines. Coagulation for        bleeding prevention using argon beam at 0.3 liters/minute and 20 watts        was successful.       There was a rectal cuff beginning at at 1 cm from the anal verge,        characterized by healthy appearing mucosa. The cuff extended 1 cm in        length.    EGD 01/2022  No evidence of blood or source of hematochezia seen on upper endoscopy.       The esophagus was normal.       Multiple pedunculated and sessile polyps with no bleeding and no        stigmata of recent bleeding were found in the entire examined stomach.        Previously biopsied and consistent with adenomas       The examined duodenum was normal.       The examined jejunum was normal.

## 2022-10-01 LAB — PHOSPHORUS: PHOSPHORUS: 3.5 mg/dL (ref 2.4–5.1)

## 2022-10-01 LAB — COMPREHENSIVE METABOLIC PANEL
ALBUMIN: 3 g/dL — ABNORMAL LOW (ref 3.4–5.0)
ALKALINE PHOSPHATASE: 64 U/L (ref 46–116)
ALT (SGPT): 11 U/L (ref 10–49)
ANION GAP: 5 mmol/L (ref 5–14)
AST (SGOT): 15 U/L (ref <=34)
BILIRUBIN TOTAL: 0.4 mg/dL (ref 0.3–1.2)
BLOOD UREA NITROGEN: 12 mg/dL (ref 9–23)
BUN / CREAT RATIO: 24
CALCIUM: 8.6 mg/dL — ABNORMAL LOW (ref 8.7–10.4)
CHLORIDE: 109 mmol/L — ABNORMAL HIGH (ref 98–107)
CO2: 28 mmol/L (ref 20.0–31.0)
CREATININE: 0.5 mg/dL — ABNORMAL LOW
EGFR CKD-EPI (2021) FEMALE: >90 mL/min/{1.73_m2} (ref >=60)
GLUCOSE RANDOM: 94 mg/dL (ref 70–179)
POTASSIUM: 4 mmol/L (ref 3.4–4.8)
PROTEIN TOTAL: 5.7 g/dL (ref 5.7–8.2)
SODIUM: 142 mmol/L (ref 135–145)

## 2022-10-01 LAB — CBC
HEMATOCRIT: 31.7 % — ABNORMAL LOW (ref 34.0–44.0)
HEMOGLOBIN: 10.3 g/dL — ABNORMAL LOW (ref 11.3–14.9)
MEAN CORPUSCULAR HEMOGLOBIN CONC: 32.5 g/dL (ref 32.0–36.0)
MEAN CORPUSCULAR HEMOGLOBIN: 25.9 pg (ref 25.9–32.4)
MEAN CORPUSCULAR VOLUME: 79.9 fL (ref 77.6–95.7)
MEAN PLATELET VOLUME: 9.3 fL (ref 6.8–10.7)
PLATELET COUNT: 207 10*9/L (ref 150–450)
RED BLOOD CELL COUNT: 3.96 10*12/L (ref 3.95–5.13)
RED CELL DISTRIBUTION WIDTH: 12.5 % (ref 12.2–15.2)
WBC ADJUSTED: 4.9 10*9/L (ref 3.6–11.2)

## 2022-10-01 LAB — MAGNESIUM: MAGNESIUM: 2 mg/dL (ref 1.6–2.6)

## 2022-10-01 MED ADMIN — methocarbamol (ROBAXIN) 250 mg in sodium chloride (NS) 0.9 % 50 mL IVPB: 250 mg | INTRAVENOUS | @ 06:00:00 | Stop: 2022-10-02 | NDC 71288071611

## 2022-10-01 MED ADMIN — HYDROmorphone (PF) injection Syrg 0.2 mg: .2 mg | INTRAVENOUS | @ 13:00:00 | Stop: 2022-10-01 | NDC 00548194010

## 2022-10-01 MED ADMIN — famotidine (PF) (PEPCID) injection 20 mg: 20 mg | INTRAVENOUS | NDC 96619043921

## 2022-10-01 MED ADMIN — promethazine (PHENERGAN) injection 6.25 mg: 6.25 mg | INTRAVENOUS | @ 13:00:00 | NDC 60977000143

## 2022-10-01 MED ADMIN — acetaminophen (OFIRMEV) 10 mg/mL injection 1,000 mg: 1000 mg | INTRAVENOUS | @ 02:00:00 | Stop: 2022-10-01 | NDC 68094059462

## 2022-10-01 MED ADMIN — ondansetron (ZOFRAN) injection 4 mg: 4 mg | INTRAVENOUS | NDC 71930001752

## 2022-10-01 MED ADMIN — promethazine (PHENERGAN) injection 6.25 mg: 6.25 mg | INTRAVENOUS | @ 20:00:00 | NDC 60977000143

## 2022-10-01 MED ADMIN — buprenorphine-naloxone (SUBOXONE) 2-0.5 mg SL film 0.5 mg of buprenorphine: .25 | SUBLINGUAL | @ 18:00:00 | NDC 52427069211

## 2022-10-01 MED ADMIN — LORazepam (ATIVAN) injection 0.5 mg: .5 mg | INTRAVENOUS | @ 06:00:00 | NDC 76329826101

## 2022-10-01 MED ADMIN — Parenteral Nutrition (CENTRAL): INTRAVENOUS | @ 02:00:00 | Stop: 2022-10-01 | NDC 96295013582

## 2022-10-01 MED ADMIN — ondansetron (ZOFRAN) injection 4 mg: 4 mg | INTRAVENOUS | @ 06:00:00 | NDC 71930001752

## 2022-10-01 MED ADMIN — HYDROmorphone (PF) injection Syrg 0.2 mg: .2 mg | INTRAVENOUS | @ 18:00:00 | Stop: 2022-10-01 | NDC 00548194010

## 2022-10-01 MED ADMIN — lactated Ringers infusion: 50 mL/h | INTRAVENOUS | @ 18:00:00 | NDC 11845118709

## 2022-10-01 MED ADMIN — buprenorphine-naloxone (SUBOXONE) 2-0.5 mg SL film 0.5 mg of buprenorphine: .25 | SUBLINGUAL | @ 21:00:00 | NDC 52427069211

## 2022-10-01 MED ADMIN — pantoprazole (Protonix) injection 40 mg: 40 mg | INTRAVENOUS | NDC 82009001190

## 2022-10-01 MED ADMIN — methocarbamol (ROBAXIN) 250 mg in sodium chloride (NS) 0.9 % 50 mL IVPB: 250 mg | INTRAVENOUS | @ 15:00:00 | Stop: 2022-10-02 | NDC 71288071611

## 2022-10-01 MED ADMIN — buprenorphine-naloxone (SUBOXONE) 2-0.5 mg SL film 0.5 mg of buprenorphine: .25 | SUBLINGUAL | @ 13:00:00 | NDC 52427069211

## 2022-10-01 MED ADMIN — ondansetron (ZOFRAN) injection 4 mg: 4 mg | INTRAVENOUS | @ 18:00:00 | NDC 71930001752

## 2022-10-01 MED ADMIN — phenol (CHLORASEPTIC) 1.4 % spray 2 spray: 2 | @ 01:00:00 | NDC 96295013193

## 2022-10-01 MED ADMIN — LORazepam (ATIVAN) injection 0.5 mg: .5 mg | INTRAVENOUS | @ 15:00:00 | NDC 76329826101

## 2022-10-01 MED ADMIN — LORazepam (ATIVAN) injection 0.5 mg: .5 mg | INTRAVENOUS | NDC 76329826101

## 2022-10-01 MED ADMIN — methocarbamol (ROBAXIN) 250 mg in sodium chloride (NS) 0.9 % 50 mL IVPB: 250 mg | INTRAVENOUS | @ 21:00:00 | Stop: 2022-10-02 | NDC 71288071611

## 2022-10-01 MED ADMIN — LORazepam (ATIVAN) injection 0.5 mg: .5 mg | INTRAVENOUS | @ 21:00:00 | NDC 76329826101

## 2022-10-01 MED ADMIN — pantoprazole (Protonix) injection 40 mg: 40 mg | INTRAVENOUS | @ 13:00:00 | NDC 82009001190

## 2022-10-01 MED ADMIN — acetaminophen (OFIRMEV) 10 mg/mL injection 1,000 mg: 1000 mg | INTRAVENOUS | Stop: 2022-10-01 | NDC 68094059462

## 2022-10-01 MED ADMIN — promethazine (PHENERGAN) injection 6.25 mg: 6.25 mg | INTRAVENOUS | @ 02:00:00 | NDC 60977000143

## 2022-10-01 NOTE — Unmapped (Signed)
Adolescent and Young Adult Cancer Program Visit     Service date: October 01, 2022    Encounter location: Inpatient - Adult    Clinician: [name], LCSW - AYA Clinical Social Worker    Patient identifiers: [name] [name] is a 22 y.o. with desmoid tumors and [name]. [name] [name] uses she/her pronouns.      Visit Summary     Met with the patient for evaluation of needs that may be addressed by [facility].    Provided assessment and therapy around intensified anxiety and depression in context of challenging admission, and processing of medical trauma.    Follow Up/Plan:     Will see later this week.    I will continue to follow this patient for AYA-appropriate support. They have my contact information and have been encouraged to contact me as needed.     Flowsheet     AYA Assessment  Primary Caregiver: Parent  Location Seen: Inpatient  Contact Point: During treatment  Referral Source: AYA team  Issues Discussed: Mental Health  AYA Team Interventions: Supportive counseling, Psychoeducation, Goals of care support and communication  Follow Up: 1 week  Time Spent (in minutes): 80    10/01/2022     [name], LCSW  Adolescent and Young Adult Visual merchandiser

## 2022-10-01 NOTE — Unmapped (Signed)
Department of Anesthesiology  Pain Medicine Division    Chronic Pain Followup Inpatient Consult Note    Requesting Attending Physician:  [name], MD  Service Requesting Consult:  Med Bernita Raisin [facility])    Assessment/Recommendations:    The patient was seen in consultation on request of Rocco Serene, MD regarding assistance with pain management for patient's chronic intractable abdominal pain with now acute on chronic recurrent ileus/NG tube placement.  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      Patient currently established with [facility] pain management center Dr. Dyann Ruddle, has been on oxycodone 10 mg 3 times daily, Suboxone 0.5 mg 3 times daily along with baclofen and pregabalin.  Through her psychiatrist she is on Ativan 0.5 mg 3 times daily as needed.      Patient has had multiple prior inpatient stays either regarding acute exacerbation of chronic pain or ileus.  In the past 12 months she has had 2 infusions of ketamine with limited benefit (last infusion in May 2024) and at one point there was some transaminitis which was thought to be attributed from other etiologies like TPN versus other intra-abdominal pathologies.  In the past patient also has had lidocaine infusions during inpatient stay with limited/variable benefit.           Regarding ileus and her constipation, After discussion with the patient and mother we came to an agreement to attempt with a low-dose of naloxegol 12.5 mg, she has taken enterag around surgery time before and seemed to tolerate it. GI team recommended against neostigmine at this time. I would expect the naloxegol to increase her abdominal pain at first but hope that we can continue it and even increase to 25mg  if able.     This is no doubt a very complex situation. Her biggest issue at this point seems to be the ileus. Medication management should focus on non-opioid medications, although she has trialed others in past with side effects or minimal benefit.  Hopefully minimizing opioids and working on her ileus will help get her out of the hospital quicker than previously.           Interval: She is overall stable, no big difference in her pain. Today we will allow the lidocaine infusion to expire and see how her pain does. She has not started to [facility] yet because she would like to discuss with GI. No other planned changes today. We will follow up tomorrow to see how pain changes without the lidocaine infusion.       Recommendations:  -The chronic pain service is a consult service and does not place orders, just makes recommendations (except ketamine and lidocaine infusions)   -Please evaluate all patients on opioids for appropriateness of prescribing narcan at discharge.  The chronic pain service can assist with this.  Nasal narcan is covered by most insurances.  -Recommendations given apply to the current hospitalization and do not reflect long term recommendations.    -Recommend to discontinuing lidocaine infusion at scheduled time.  Please follow recommendation as follow  -Patient is on a low dose lidocaine infusion for pain              -All lidocaine orders will be managed by the [facility] Chronic Pain Service only              -Lidocaine requires dedicated IV (PIV or  central line lumen)              -stop  lidocaine infusion at 1mg /kg/hr (ideal body weight) at 3pm today               -Patient location: dose can be increased no sooner than every 4 hours              -Day 3 (max-3 days trial). Started (date): 09/28/22 - 10/01/22              -Prior to starting: normal recent EKG 09/26/22 - normal, Reviewed 09/28/22 ( normal creatinine, LFTs), urine pregnancy test negative 09/26/22              -Daily CMP (creatinine and LFTs daily) while running              -Lipid emulsion available PRN: Local Anesthetic Systemic Toxicity -Please contact the Chronic Pain service with any questions or concerns about lidocaine infusion.      Agree with GI for starting naloxegol 12.5 mg p.o. daily.  GI following      -Restart home meds whenever able to take PO medications.  Could schedule baclofen and increase pregabalin.    - recommend physical therapy to work on bed level mobility          We will continue to follow.    Naloxone Rx at discharge?  Is patient on opioids? Yes.  1)Is dose >50MME?  Yes.  2) Is patient prescribed a benzodiazepine (w opioids)? Yes.  3)Hx of overdose?  No.  4) Hx of substance use disorder? No.  5) Opioids likely to last greater than a week after discharge? Yes.       If yes to 2 or more, prescribe naloxone at discharge.  Nasal narcan for most insured (Nasal narcan 4mg /actuation, prescribe 1 kit, instructions at [email or web address]).  For uninsured, chronic pain can work to assist in finding an option.  OTC nasal narcan now available at most pharmacies for around $45.    Interim History  There were no Acute Events Overnight.  The patient is not obtaining adequate pain relief on current medication regimen and feels that their pain is Somewhat controlled    Today patient reports pain is the same, she continues to have abdominal pain and overall pain. She is anxious to stop the lidocaine but understands its time for it to end. Her pain today is a 7/10     Analgesia Evaluation:  Pain at minimum: 5/10  Pain at maximum: 8/10    Current pain medication regimen (including how frequent PRN's were used):  Suboxone 2-0.5mg  TID   Lidocaine infusion 1mg /kg/hr 72 hours   Tylenol IV   Baclofen 10mg  TID on hold   Robaxin IV 250mg  TID   Oxycodone 15mg  q4h PRN (on hold)     Inpatient Medications  Current Facility-Administered Medications   Medication Dose Route Frequency Provider Last Rate Last Admin    acetaminophen (OFIRMEV) 10 mg/mL injection 1,000 mg  1,000 mg Intravenous Q6H PRN Dorise Hiss, ACNP   Stopped at 09/30/22 2158 [Provider Hold] baclofen (LIORESAL) tablet 10 mg  10 mg Oral TID PRN Rocco Serene, MD   10 mg at 09/27/22 1639    buprenorphine-naloxone (SUBOXONE) 2-0.5 mg SL film 0.5 mg of buprenorphine  0.25 Film Sublingual TID Terri Piedra, AGNP   0.5 mg of buprenorphine at 10/01/22 0840    calcium carbonate (TUMS) chewable tablet 400 mg elem calcium  400 mg  elem calcium Oral Daily PRN Terri Piedra, AGNP        [Provider Hold] cefdinir (OMNICEF) capsule 300 mg  300 mg Oral Q12H [facility] Rocco Serene, MD        [Provider Hold] doxycycline (VIBRA-TABS) tablet 100 mg  100 mg Oral BID Rocco Serene, MD        famotidine (PF) (PEPCID) injection 20 mg  20 mg Intravenous At bedtime Rocco Serene, MD   20 mg at 09/30/22 2013    fat emulsion 20 % (INTRAlipid) infusion 100 mL  100 mL Intravenous Once PRN Lobonc, Ammie Ferrier, MD        fat emulsion 20 % (INTRAlipid) infusion 200 mL  200 mL Intravenous Once PRN Lobonc, Ammie Ferrier, MD        lactated Ringers infusion  50 mL/hr Intravenous Continuous Rocco Serene, MD 50 mL/hr at 09/30/22 1943 50 mL/hr at 09/30/22 1943    lidocaine 2000 mg in dextrose 5 % 250 mL (8 mg/mL) infusion PMB  1 mg/kg/hr (Ideal) Intravenous Continuous Eilam [place] E, DO 7.1 mL/hr at 09/30/22 1225 1 mg/kg/hr at 09/30/22 1225    LORazepam (ATIVAN) injection 0.5 mg  0.5 mg Intravenous Q6H PRN Rocco Serene, MD   0.5 mg at 10/01/22 0214    [Provider Hold] LORazepam (ATIVAN) tablet 0.5 mg  0.5 mg Oral Q6H PRN Rocco Serene, MD        [Provider Hold] melatonin tablet 3 mg  3 mg Oral Nightly PRN Terri Piedra, AGNP        methocarbamol (ROBAXIN) 250 mg in sodium chloride (NS) 0.9 % 50 mL IVPB  250 mg Intravenous Q8H [name], MD   Stopped at 10/01/22 0238    [Provider Hold] mirtazapine (REMERON) tablet 15 mg  15 mg Oral Nightly Terri Piedra, AGNP   15 mg at 09/26/22 2316    naloxegol (MOVANTIK) 12.5 mg tablet 12.5 mg 12.5 mg Oral Daily [name], MD        ondansetron [facility]) injection 4 mg  4 mg Intravenous Q6H PRN Terri Piedra, AGNP   4 mg at 10/01/22 0214    [Provider Hold] oxyCODONE (ROXICODONE) immediate release tablet 15 mg  15 mg Oral Q4H PRN Terri Piedra, AGNP   15 mg at 09/27/22 1410    pantoprazole (Protonix) injection 40 mg  40 mg Intravenous BID Rocco Serene, MD   40 mg at 10/01/22 0840    Parenteral Nutrition (CENTRAL)   Intravenous Continuous [name], MD 50 mL/hr at 09/30/22 2132 New Bag at 09/30/22 2132    phenol (CHLORASEPTIC) 1.4 % spray 2 spray  2 spray Mucous Membrane Q2H PRN Rocco Serene, MD   2 spray at 09/30/22 2034    [Provider Hold] polyethylene glycol (MIRALAX) packet 17 g  17 g Oral Daily PRN Terri Piedra, AGNP        promethazine (PHENERGAN) injection 6.25 mg  6.25 mg Intravenous Q6H PRN Rocco Serene, MD   6.25 mg at 10/01/22 0840         Objective:     Vital Signs    Temp:  [36.5 ??C (97.7 ??F)-37.1 ??C (98.8 ??F)] 36.7 ??C (98 ??F)  Heart Rate:  [67-75] 67  SpO2 Pulse:  [67-73] 67  Resp:  [8-14] 12  BP: (102-111)/(40-54) 102/43  MAP (mmHg):  [61-67] 61  SpO2:  [98 %-100 %] 100 %      Physical  Exam     GENERAL:  Well developed, well-nourished female and looking uncomfortable from pain and NG tube discomfort.   HEAD/NECK:    Reveals normocephalic/atraumatic.   CARDIOVASCULAR:   Regular rate  LUNGS:   Normal work of breathing, no supplemental 02  EXTREMITIES:  Warm, no clubbing, cyanosis, or edema was noted.  NEUROLOGIC:    The patient was alert and oriented times four with normal language, attention, cognition and memory. Cranial nerve exam was grossly normal.    MUSCULOSKELETAL:    SKIN:  No obvious rashes lesions or erythema  PSY:  Appropriate affect and mood.    Test Results    Lab Results   Component Value Date    CREATININE 0.50 (L) 10/01/2022     Lab Results   Component Value Date    ALKPHOS 64 10/01/2022 BILITOT 0.4 10/01/2022    BILIDIR <0.10 08/08/2022    PROT 5.7 10/01/2022    ALBUMIN 3.0 (L) 10/01/2022    ALT 11 10/01/2022    AST 15 10/01/2022           Problem List    Principal Problem:    Other acute postprocedural pain  Active Problems:    [name]    Intestinal polyps    [name]    Neoplasm related pain    Physical deconditioning    History of colectomy    Intractable abdominal pain    Generalized anxiety disorder with panic attacks    SBO (small bowel obstruction) (CMS-HCC)    Small intestinal bacterial overgrowth (SIBO)      [place], DO   PGY-5 Pain Fellow

## 2022-10-01 NOTE — Unmapped (Signed)
Hospital Medicine Daily Progress Note    Assessment/Plan:    Principal Problem:    Other acute postprocedural pain  Active Problems:    [name]    Intestinal polyps    [name]    Neoplasm related pain    Physical deconditioning    History of colectomy    Intractable abdominal pain    Generalized anxiety disorder with panic attacks    SBO (small bowel obstruction) (CMS-HCC)    Small intestinal bacterial overgrowth (SIBO)  Resolved Problems:    * No resolved hospital problems. *   Malnutrition Evaluation as performed by RD, LDN: Patient does not meet AND/ASPEN criteria for malnutrition at this time (09/29/22 1439)             [name] [name] is a 22 y.o. female with existing desmoid tumors, multiple cutaneous and intestinal tumors with given underlying gardener syndrome that presented to [facility] with Other acute postprocedural pain.    Ileus - Nausea and vomiting: abdominal pain and inability to tolerate any PO for several days. Imaging with multiple dilated air and fluid containing bowel loops without a discrete transition point which may be indicative of ileus. NG tube placed and started on TPN on 7/21. KUB shows improvement in bowel dilatation.  - GI medicine consult appreciated  - Nutrition consult for peripheral nutrition  - Limiting opiates as tolerated - on lidocaine infusion which ends this afternoon  - Continue IV phenergan  - Recommending Movantik to help with bowel mobility  - Ordered daily Dulcolax suppository as well    Muscle Spasms and Diffuse Pain: patient was undergoing IR guided procedure for desmoid tumors which included steroid injection under ultrasound guidance, afterwards developed severe pain and shaking/rigidity. Neurology consult appreciated and MRI spine returned unrevealing.   - Anesthesia Pain Consult appreciated - Lidocaine gtt completed afer 72 hours today  - IV robaxin completed today  - IV ativan in place of home PO ativan  - Providing lower dose IV Dilaudid prn and PO Oxycodone prn    Coffee Ground Emesis: noted in NG tube on 7/21  - Hgb stable without hemodynamic instability  - Continue PPI IV BID     [name]  - Currently holding home nirogacestat after discussion with Heme/Onc     Small intestinal bacterial overgrowth  - History of colectomy  - Holding home cefdinir and doxycycline for SIBO in setting of broad-spectrum antibiotics     History of desmoid tumors  - On nirogacestat and intermittent steroid injections with VIR   -GI desmoids involvement    ___________________________________________________________________    Subjective:  Patient continues to complain of ongoing pain. Did not take Movantik last night and wanted to discuss with GI medicine today. No bowel movements or flatus passed. Continues to endorse muscle stiffness and soreness. Discussed patient with nursing staff and reviewed MAR.    I personally spent more than 50 minutes face-to-face and non-face-to-face in the care of this patient, which includes all pre, intra, and post visit time on the date of service.      Labs/Studies:  Labs and Studies from the last 24hrs per EMR and Reviewed    Objective:  Temp:  [36.5 ??C (97.7 ??F)-37.1 ??C (98.8 ??F)] 36.9 ??C (98.4 ??F)  Heart Rate:  [63-75] 67  SpO2 Pulse:  [63-73] 67  Resp:  [8-14] 12  BP: (102-111)/(40-54) 102/43  SpO2:  [98 %-100 %] 100 %    Physical Exam for 10/01/22  GEN: Lying in  bed, appears comfortable.  HEENT: EOMI, NG tube in place.  CV: RRR, S1 and S2 normal.  LUNGS: CTA bilaterally. Normal WOB  ABD: Soft, tender to palpation throughout, ND, normoactive BS.  EXT: Trace LE edema, 2+ radial and pedal pulses bilaterally.

## 2022-10-01 NOTE — Unmapped (Signed)
Hospital Medicine Daily Progress Note    Assessment/Plan:    Principal Problem:    Other acute postprocedural pain  Active Problems:    [name]    Intestinal polyps    [name]    Neoplasm related pain    Physical deconditioning    History of colectomy    Intractable abdominal pain    Generalized anxiety disorder with panic attacks    SBO (small bowel obstruction) (CMS-HCC)    Small intestinal bacterial overgrowth (SIBO)  Resolved Problems:    * No resolved hospital problems. *   Malnutrition Evaluation as performed by RD, LDN: Patient does not meet AND/ASPEN criteria for malnutrition at this time (09/29/22 1439)             [name] [name] is a 22 y.o. female with existing desmoid tumors, multiple cutaneous and intestinal tumors with given underlying gardener syndrome that presented to [facility] with Other acute postprocedural pain.    Muscle Spasms and Diffuse Pain: patient was undergoing IR guided procedure for desmoid tumors which included steroid injection under ultrasound guidance, afterwards developed severe pain and shaking/rigidity. Neurology consult appreciated and MRI spine returned unrevealing.   - Anesthesia Pain Consult appreciated - continue Lidocaine gtt for another 24 hours, limit opioids in the setting of ileus  - IV robaxin for course <72 hours  - IV ativan in place of home PO ativan     Ileus - Nausea and vomiting: abdominal pain and inability to tolerate any PO for several days. Imaging with multiple dilated air and fluid containing bowel loops without a discrete transition point which may be indicative of ileus. NG tube placed and started on TPN on 7/21.  - GI medicine consult appreciated  - Nutrition consult for peripheral nutrition  - Limiting opiates as tolerated - on lidocaine infusion  - Stopped reglan scheduled IV after 1 dose due to patient stating it caused pain and agitation, continue IV phenergan  - Recommending Movantik to help with bowel mobility    [place] Emesis: noted in NG tube on 7/21  - Hgb stable without hemodynamic instability  - Continue PPI IV BID  - GI medicine consulting     [name]  - Currently holding home nirogacestat after discussion with Heme/Onc     Small intestinal bacterial overgrowth  - History of colectomy  - Holding home cefdinir and doxycycline for SIBO in setting of broad-spectrum antibiotics     History of desmoid tumors  - On nirogacestat and intermittent steroid injections with VIR   -GI desmoids involvement    ___________________________________________________________________    Subjective:  Patient with continued abdominal pain with difficulty sleeping overnight. NG tube remains in place to suction. No bowel movements or flatus passed. Patient continues to report nausea. Still feels weak with rigidity, but able to move her extremities. Discussed patient with nursing staff and reviewed MAR.    I personally spent more than 50 minutes face-to-face and non-face-to-face in the care of this patient, which includes all pre, intra, and post visit time on the date of service.    Labs/Studies:  Labs and Studies from the last 24hrs per EMR and Reviewed    Objective:  Temp:  [36.5 ??C (97.7 ??F)-36.9 ??C (98.5 ??F)] 36.9 ??C (98.5 ??F)  Heart Rate:  [63-75] 68  SpO2 Pulse:  [63-73] 68  Resp:  [8-14] 8  BP: (100-119)/(40-55) 107/49  SpO2:  [99 %] 99 %    Physical Exam for 09/30/22  GEN:  Lying in bed, appears comfortable.  HEENT: EOMI, NG tube in place.  CV: RRR, S1 and S2 normal.  LUNGS: CTA bilaterally. Normal WOB  ABD: Soft, tender to palpation throughout, ND, normoactive BS.  EXT: Trace LE edema, 2+ radial and pedal pulses bilaterally.

## 2022-10-01 NOTE — Unmapped (Signed)
Pt is alert, intermittently drowsy. Oriented x4. VSS on 2L [place]. SBP 100s. HR NSR. NGT to LIWS per orders. Lidocaine drip continued. Continuous LR @50mL /hr infusing. TPN infusing. Mother at bedside and updated. Adequate UOP, no BM. C/o severe abdominal pain and nausea, see PRNs. See flowsheets/MAR for more info.     Problem: Adult Inpatient Plan of Care  Goal: Plan of Care Review  Outcome: Ongoing - Unchanged  Goal: Patient-Specific Goal (Individualized)  Outcome: Ongoing - Unchanged  Goal: Absence of Hospital-Acquired Illness or Injury  Outcome: Ongoing - Unchanged  Intervention: Identify and Manage Fall Risk  Recent Flowsheet Documentation  Taken 09/30/2022 2000 by Hassie Bruce, RN  Safety Interventions:   aspiration precautions   bed alarm   bleeding precautions   commode/urinal/bedpan at bedside   environmental modification   fall reduction program maintained   family at bedside   infection management   lighting adjusted for tasks/safety   low bed   nonskid shoes/slippers when out of bed   room near unit station  Intervention: Prevent and Manage VTE (Venous Thromboembolism) Risk  Recent Flowsheet Documentation  Taken 10/01/2022 0000 by Hassie Bruce, RN  Anti-Embolism Device Type: SCD, Knee  Anti-Embolism Intervention: On  Anti-Embolism Device Location: BLE  Taken 09/30/2022 2200 by Hassie Bruce, RN  Anti-Embolism Device Type: SCD, Knee  Anti-Embolism Intervention: On  Anti-Embolism Device Location: BLE  Taken 09/30/2022 2000 by Hassie Bruce, RN  Anti-Embolism Device Type: SCD, Knee  Anti-Embolism Intervention: On  Anti-Embolism Device Location: BLE  Intervention: Prevent Infection  Recent Flowsheet Documentation  Taken 09/30/2022 2000 by Hassie Bruce, RN  Infection Prevention:   environmental surveillance performed   equipment surfaces disinfected   personal protective equipment utilized   hand hygiene promoted   rest/sleep promoted   single patient room provided  Goal: Optimal Comfort and Wellbeing  Outcome: Ongoing - Unchanged  Goal: Readiness for Transition of Care  Outcome: Ongoing - Unchanged  Goal: Rounds/Family Conference  Outcome: Ongoing - Unchanged     Problem: Latex Allergy  Goal: Absence of Allergy Symptoms  Outcome: Ongoing - Unchanged     Problem: Fall Injury Risk  Goal: Absence of Fall and Fall-Related Injury  Outcome: Ongoing - Unchanged  Intervention: Promote Injury-Free Environment  Recent Flowsheet Documentation  Taken 09/30/2022 2000 by Hassie Bruce, RN  Safety Interventions:   aspiration precautions   bed alarm   bleeding precautions   commode/urinal/bedpan at bedside   environmental modification   fall reduction program maintained   family at bedside   infection management   lighting adjusted for tasks/safety   low bed   nonskid shoes/slippers when out of bed   room near unit station     Problem: Skin Injury Risk Increased  Goal: Skin Health and Integrity  Outcome: Ongoing - Unchanged  Intervention: Optimize Skin Protection  Recent Flowsheet Documentation  Taken 09/30/2022 2337 by Hassie Bruce, RN  Head of Bed [facility]) Positioning: HOB at 30-45 degrees  Taken 09/30/2022 2000 by Hassie Bruce, RN  Activity Management: ambulated to bathroom  Pressure Reduction Techniques:   frequent weight shift encouraged   heels elevated off bed   weight shift assistance provided  Head of Bed (HOB) Positioning: HOB at 30-45 degrees     Problem: Self-Care Deficit  Goal: Improved Ability to Complete Activities of Daily Living  Outcome: Ongoing - Unchanged

## 2022-10-02 LAB — COMPREHENSIVE METABOLIC PANEL
ALBUMIN: 3.1 g/dL — ABNORMAL LOW (ref 3.4–5.0)
ALKALINE PHOSPHATASE: 68 U/L (ref 46–116)
ALT (SGPT): 17 U/L (ref 10–49)
ANION GAP: 4 mmol/L — ABNORMAL LOW (ref 5–14)
AST (SGOT): 21 U/L (ref <=34)
BILIRUBIN TOTAL: 0.7 mg/dL (ref 0.3–1.2)
BLOOD UREA NITROGEN: 13 mg/dL (ref 9–23)
BUN / CREAT RATIO: 27
CALCIUM: 8.9 mg/dL (ref 8.7–10.4)
CHLORIDE: 108 mmol/L — ABNORMAL HIGH (ref 98–107)
CO2: 29 mmol/L (ref 20.0–31.0)
CREATININE: 0.48 mg/dL — ABNORMAL LOW
EGFR CKD-EPI (2021) FEMALE: >90 mL/min/{1.73_m2} (ref >=60)
GLUCOSE RANDOM: 100 mg/dL (ref 70–179)
POTASSIUM: 4 mmol/L (ref 3.4–4.8)
PROTEIN TOTAL: 6 g/dL (ref 5.7–8.2)
SODIUM: 141 mmol/L (ref 135–145)

## 2022-10-02 LAB — CBC
HEMATOCRIT: 31.7 % — ABNORMAL LOW (ref 34.0–44.0)
HEMOGLOBIN: 10.4 g/dL — ABNORMAL LOW (ref 11.3–14.9)
MEAN CORPUSCULAR HEMOGLOBIN CONC: 32.8 g/dL (ref 32.0–36.0)
MEAN CORPUSCULAR HEMOGLOBIN: 26.2 pg (ref 25.9–32.4)
MEAN CORPUSCULAR VOLUME: 80 fL (ref 77.6–95.7)
MEAN PLATELET VOLUME: 9.4 fL (ref 6.8–10.7)
PLATELET COUNT: 200 10*9/L (ref 150–450)
RED BLOOD CELL COUNT: 3.97 10*12/L (ref 3.95–5.13)
RED CELL DISTRIBUTION WIDTH: 12.2 % (ref 12.2–15.2)
WBC ADJUSTED: 5.3 10*9/L (ref 3.6–11.2)

## 2022-10-02 LAB — MAGNESIUM: MAGNESIUM: 2 mg/dL (ref 1.6–2.6)

## 2022-10-02 LAB — PHOSPHORUS: PHOSPHORUS: 3.5 mg/dL (ref 2.4–5.1)

## 2022-10-02 MED ADMIN — ondansetron (ZOFRAN) injection 4 mg: 4 mg | INTRAVENOUS | @ 18:00:00 | NDC 71930001752

## 2022-10-02 MED ADMIN — LORazepam (ATIVAN) injection 0.5 mg: .5 mg | INTRAVENOUS | @ 15:00:00 | NDC 76329826101

## 2022-10-02 MED ADMIN — LORazepam (ATIVAN) injection 0.5 mg: .5 mg | INTRAVENOUS | @ 03:00:00 | NDC 76329826101

## 2022-10-02 MED ADMIN — bisacodyl (DULCOLAX) suppository 10 mg: 10 mg | RECTAL | @ 02:00:00 | NDC 96295012646

## 2022-10-02 MED ADMIN — HYDROmorphone (PF) injection Syrg 0.5 mg: .5 mg | INTRAVENOUS | @ 21:00:00 | Stop: 2022-10-04 | NDC 00409426411

## 2022-10-02 MED ADMIN — ondansetron (ZOFRAN) injection 4 mg: 4 mg | INTRAVENOUS | @ 12:00:00 | NDC 71930001752

## 2022-10-02 MED ADMIN — promethazine (PHENERGAN) injection 6.25 mg: 6.25 mg | INTRAVENOUS | @ 08:00:00 | NDC 60977000143

## 2022-10-02 MED ADMIN — buprenorphine-naloxone (SUBOXONE) 2-0.5 mg SL film 0.5 mg of buprenorphine: .25 | SUBLINGUAL | @ 13:00:00 | NDC 52427069211

## 2022-10-02 MED ADMIN — HYDROmorphone (PF) injection Syrg 0.2 mg: .2 mg | INTRAVENOUS | @ 10:00:00 | Stop: 2022-10-02 | NDC 00548194010

## 2022-10-02 MED ADMIN — LORazepam (ATIVAN) injection 0.5 mg: .5 mg | INTRAVENOUS | @ 08:00:00 | NDC 76329826101

## 2022-10-02 MED ADMIN — naloxegol (MOVANTIK) 12.5 mg tablet 12.5 mg: 12.5 mg | ORAL | @ 13:00:00 | NDC 82625880101

## 2022-10-02 MED ADMIN — ondansetron (ZOFRAN) injection 4 mg: 4 mg | INTRAVENOUS | @ 06:00:00 | NDC 71930001752

## 2022-10-02 MED ADMIN — buprenorphine-naloxone (SUBOXONE) 2-0.5 mg SL film 0.5 mg of buprenorphine: .25 | SUBLINGUAL | @ 18:00:00 | NDC 52427069211

## 2022-10-02 MED ADMIN — famotidine (PF) (PEPCID) injection 20 mg: 20 mg | INTRAVENOUS | @ 02:00:00 | NDC 96619043921

## 2022-10-02 MED ADMIN — buprenorphine-naloxone (SUBOXONE) 2-0.5 mg SL film 0.5 mg of buprenorphine: .25 | SUBLINGUAL | @ 22:00:00 | NDC 52427069211

## 2022-10-02 MED ADMIN — promethazine (PHENERGAN) injection 6.25 mg: 6.25 mg | INTRAVENOUS | @ 21:00:00 | NDC 60977000143

## 2022-10-02 MED ADMIN — pantoprazole (Protonix) injection 40 mg: 40 mg | INTRAVENOUS | @ 13:00:00 | NDC 82009001190

## 2022-10-02 MED ADMIN — HYDROmorphone (PF) injection Syrg 0.2 mg: .2 mg | INTRAVENOUS | @ 02:00:00 | Stop: 2022-10-04 | NDC 00548194010

## 2022-10-02 MED ADMIN — pantoprazole (Protonix) injection 40 mg: 40 mg | INTRAVENOUS | @ 02:00:00 | NDC 82009001190

## 2022-10-02 MED ADMIN — HYDROmorphone (PF) injection Syrg 0.5 mg: .5 mg | INTRAVENOUS | @ 15:00:00 | Stop: 2022-10-04 | NDC 00409426411

## 2022-10-02 MED ADMIN — methocarbamol (ROBAXIN) 250 mg in sodium chloride (NS) 0.9 % 50 mL IVPB: 250 mg | INTRAVENOUS | @ 13:00:00 | Stop: 2022-10-02 | NDC 71288071611

## 2022-10-02 MED ADMIN — promethazine (PHENERGAN) injection 6.25 mg: 6.25 mg | INTRAVENOUS | @ 02:00:00 | NDC 60977000143

## 2022-10-02 MED ADMIN — promethazine (PHENERGAN) injection 6.25 mg: 6.25 mg | INTRAVENOUS | @ 14:00:00 | NDC 60977000143

## 2022-10-02 MED ADMIN — lactated Ringers infusion: 50 mL/h | INTRAVENOUS | @ 14:00:00 | NDC 11845118709

## 2022-10-02 MED ADMIN — ondansetron (ZOFRAN) injection 4 mg: 4 mg | INTRAVENOUS | NDC 71930001752

## 2022-10-02 MED ADMIN — methocarbamol (ROBAXIN) 250 mg in sodium chloride (NS) 0.9 % 50 mL IVPB: 250 mg | INTRAVENOUS | @ 06:00:00 | Stop: 2022-10-02 | NDC 71288071611

## 2022-10-02 MED ADMIN — LORazepam (ATIVAN) injection 0.5 mg: .5 mg | INTRAVENOUS | @ 22:00:00 | NDC 76329826101

## 2022-10-02 MED ADMIN — acetaminophen (OFIRMEV) 10 mg/mL injection 1,000 mg: 1000 mg | INTRAVENOUS | @ 16:00:00 | Stop: 2022-10-03 | NDC 68094059462

## 2022-10-02 NOTE — Unmapped (Signed)
Pt is A&Ox4. NSR with HR 60s-90s. Bps stable with systolic's 100s-120s. O2 sats stable on 2L . Afebrile. NGT to low-intermittent wall suction. Continuous LR infusing continuously. C/o of  abdominal pain and two one time doses of pain meds were given per order. PRN ativan, zofran and phenergan given multiple times throughout the shift. UO adequate via bathroom, no BM this shift. Pt's remains NPO, TPN running continuously. Skin intact,  and standard precautions maintained. No falls/injuries this shift. Worked with OT and was able to ambulate to hallway and chair.  All monitors with appropriate alarm settings. Mom remains at bedside.    Problem: Adult Inpatient Plan of Care  Goal: Plan of Care Review  Outcome: Ongoing - Unchanged  Goal: Patient-Specific Goal (Individualized)  Outcome: Ongoing - Unchanged     Problem: Fall Injury Risk  Goal: Absence of Fall and Fall-Related Injury  Outcome: Ongoing - Unchanged  Intervention: Promote Scientist, clinical (histocompatibility and immunogenetics) Documentation  Taken 10/01/2022 0800 by Linward Foster, RN  Safety Interventions:   aspiration precautions   commode/urinal/bedpan at bedside   environmental modification   fall reduction program maintained   family at bedside   infection management   lighting adjusted for tasks/safety   low bed   nonskid shoes/slippers when out of bed   room near unit station     Problem: Skin Injury Risk Increased  Goal: Skin Health and Integrity  Outcome: Ongoing - Unchanged  Intervention: Optimize Skin Protection  Recent Flowsheet Documentation  Taken 10/01/2022 1505 by Linward Foster, RN  Head of Bed [facility]) Positioning: HOB at 30-45 degrees  Taken 10/01/2022 1400 by Linward Foster, RN  Pressure Reduction Techniques:   frequent weight shift encouraged   heels elevated off bed  Pressure Reduction Devices: pressure-redistributing mattress utilized  Taken 10/01/2022 1200 by Linward Foster, RN  Pressure Reduction Techniques: frequent weight shift encouraged  Pressure Reduction Devices: pressure-redistributing mattress utilized  Taken 10/01/2022 1154 by Linward Foster, RN  Head of Bed [facility]) Positioning: HOB at 30-45 degrees  Taken 10/01/2022 1000 by Lacy Sofia Z, RN  Pressure Reduction Techniques:   frequent weight shift encouraged   heels elevated off bed   weight shift assistance provided  Pressure Reduction Devices: pressure-redistributing mattress utilized  Taken 10/01/2022 0800 by Lawan Nanez Z, RN  Pressure Reduction Techniques:   frequent weight shift encouraged   heels elevated off bed   weight shift assistance provided  Head of Bed (HOB) Positioning: HOB at 30-45 degrees  Pressure Reduction Devices: pressure-redistributing mattress utilized     Problem: Self-Care Deficit  Goal: Improved Ability to Complete Activities of Daily Living  Outcome: Ongoing - Unchanged

## 2022-10-02 NOTE — Unmapped (Signed)
Department of Anesthesiology  Pain Medicine Division    Chronic Pain Followup Inpatient Consult Note    Requesting Attending Physician:  [name], MD  Service Requesting Consult:  Med Bernita Raisin [facility])    Assessment/Recommendations:    The patient was seen in consultation on request of Rocco Serene, MD regarding assistance with pain management for patient's chronic intractable abdominal pain with now acute on chronic recurrent ileus/NG tube placement.  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      Patient currently established with [facility] pain management center Dr. Dyann Ruddle, has been on oxycodone 10 mg 3 times daily, Suboxone 0.5 mg 3 times daily along with baclofen and pregabalin.  Through her psychiatrist she is on Ativan 0.5 mg 3 times daily as needed.      Patient has had multiple prior inpatient stays either regarding acute exacerbation of chronic pain or ileus.  In the past 12 months she has had 2 infusions of ketamine with limited benefit (last infusion in May 2024) and at one point there was some transaminitis which was thought to be attributed from other etiologies like TPN versus other intra-abdominal pathologies.  In the past patient also has had lidocaine infusions during inpatient stay with limited/variable benefit.           Regarding ileus and her constipation, After discussion with the patient and mother we came to an agreement to attempt with a low-dose of naloxegol 12.5 mg, she has taken enterag around surgery time before and seemed to tolerate it. GI team recommended against neostigmine at this time. I would expect the naloxegol to increase her abdominal pain at first but hope that we can continue it and even increase to 25mg  if able.     This is no doubt a very complex situation. Her biggest issue at this point seems to be the ileus. Medication management should focus on non-opioid medications, although she has trialed others in past with side effects or minimal benefit.  Hopefully minimizing opioids and working on her ileus will help get her out of the hospital quicker than previously.           Interval: She is overall stable. Her pain is about the same to slightly worse without the lidocaine but she was able to work with therapy and walk to the nurses' station and recline in a chair yesterday. We celebrated those victories and continued to encourage her. She did mention if IV tylenol could restart as it was very effective for her headaches. Mom and patient inquired about restarting opioids in a regular manner but we recommend against this as she has an ileus and that will worsen her symptoms. Occasional PRNs are ok and safe and managed by primary team.       Recommendations:  -The chronic pain service is a consult service and does not place orders, just makes recommendations (except ketamine and lidocaine infusions)   -Please evaluate all patients on opioids for appropriateness of prescribing narcan at discharge.  The chronic pain service can assist with this.  Nasal narcan is covered by most insurances.  -Recommendations given apply to the current hospitalization and do not reflect long term recommendations.    -discontinued lidocaine infusion at scheduled time.  09/28/22 - 10/01/22  - recommend restarting IV tylenol as needed as this was effective for her headaches.  -Agree with GI for  starting naloxegol 12.5 mg p.o. daily.  GI following - had some bowel output with suppository   -Restart home meds whenever able to take PO medications.  Could schedule baclofen and increase pregabalin.  -recommend continued physical therapy to work on bed level mobility   - would recommend speech therapy consult to work on oral desensitization as she has not been able to tolerate anyone in her mouth such as ice chips or small sips of water         We will continue to follow.    Naloxone Rx at discharge?  Is patient on opioids? Yes.  1)Is dose >50MME?  Yes.  2) Is patient prescribed a benzodiazepine (w opioids)? Yes.  3)Hx of overdose?  No.  4) Hx of substance use disorder? No.  5) Opioids likely to last greater than a week after discharge? Yes.       If yes to 2 or more, prescribe naloxone at discharge.  Nasal narcan for most insured (Nasal narcan 4mg /actuation, prescribe 1 kit, instructions at [email or web address]).  For uninsured, chronic pain can work to assist in finding an option.  OTC nasal narcan now available at most pharmacies for around $45.    Interim History  There were no Acute Events Overnight.  The patient is not obtaining adequate pain relief on current medication regimen and feels that their pain is Somewhat controlled    Today patient reports pain is the same, she continues to have abdominal pain and overall pain. She has tolerated the stop of lidocaine without a large increase of pain. We discussed her primary pain generator right now is her abdominal pain.    Analgesia Evaluation:  Pain at minimum: 5/10  Pain at maximum: 8/10    Current pain medication regimen (including how frequent PRN's were used):  Suboxone 2-0.5mg  TID   Lidocaine infusion 1mg /kg/hr 72 hours   Tylenol IV   Baclofen 10mg  TID on hold   Robaxin IV 250mg  TID   Oxycodone 15mg  q4h PRN (on hold)     Inpatient Medications  Current Facility-Administered Medications   Medication Dose Route Frequency Provider Last Rate Last Admin    acetaminophen (OFIRMEV) 10 mg/mL injection 1,000 mg  1,000 mg Intravenous Q8H [name], MD   Stopped at 10/02/22 1222    [Provider Hold] baclofen (LIORESAL) tablet 10 mg  10 mg Oral TID PRN Rocco Serene, MD   10 mg at 09/27/22 1639    bisacodyl (DULCOLAX) suppository 10 mg  10 mg Rectal Daily PRN [name], MD   10 mg at 10/01/22 2138    buprenorphine-naloxone (SUBOXONE) 2-0.5 mg SL film 0.5 mg of buprenorphine  0.25 Film Sublingual TID Terri Piedra, AGNP   0.5 mg of buprenorphine at 10/02/22 1337    calcium carbonate (TUMS) chewable tablet 400 mg elem calcium  400 mg elem calcium Oral Daily PRN Terri Piedra, AGNP        [Provider Hold] cefdinir (OMNICEF) capsule 300 mg  300 mg Oral Q12H [facility] Rocco Serene, MD        [Provider Hold] doxycycline (VIBRA-TABS) tablet 100 mg  100 mg Oral BID Rocco Serene, MD        famotidine (PF) (PEPCID) injection 20 mg  20 mg Intravenous At bedtime Rocco Serene, MD   20 mg at 10/01/22 2137    fat emulsion 20 % (INTRAlipid) infusion 100 mL  100 mL Intravenous Once PRN Lobonc, Ammie Ferrier, MD  fat emulsion 20 % (INTRAlipid) infusion 200 mL  200 mL Intravenous Once PRN Lobonc, Ammie Ferrier, MD        Parenteral Nutrition (CENTRAL)   Intravenous Continuous [name], MD        And    fat emulsion 20 % with fish oil (SMOFlipid) infusion 250 mL  250 mL Intravenous Continuous [name], MD        HYDROmorphone (PF) injection Syrg 0.5 mg  0.5 mg Intravenous Q6H PRN [name], MD   0.5 mg at 10/02/22 1054    lactated [place] infusion  50 mL/hr Intravenous Continuous Rocco Serene, MD 50 mL/hr at 10/02/22 1009 50 mL/hr at 10/02/22 1009    LORazepam (ATIVAN) injection 0.5 mg  0.5 mg Intravenous Q6H PRN Rocco Serene, MD   0.5 mg at 10/02/22 1041    [Provider Hold] LORazepam (ATIVAN) tablet 0.5 mg  0.5 mg Oral Q6H PRN Rocco Serene, MD        [Provider Hold] melatonin tablet 3 mg  3 mg Oral Nightly PRN Terri Piedra, AGNP        [Provider Hold] mirtazapine (REMERON) tablet 15 mg  15 mg Oral Nightly Terri Piedra, AGNP   15 mg at 09/26/22 2316    naloxegol (MOVANTIK) 12.5 mg tablet 12.5 mg  12.5 mg Oral Daily [name], MD   12.5 mg at 10/02/22 0915    ondansetron (ZOFRAN) injection 4 mg  4 mg Intravenous Q6H PRN Terri Piedra, AGNP   4 mg at 10/02/22 4034    oxyCODONE (ROXICODONE) immediate release tablet 15 mg  15 mg Oral Q4H PRN [name], MD   15 mg at 09/27/22 1410    pantoprazole (Protonix) injection 40 mg  40 mg Intravenous BID Rocco Serene, MD   40 mg at 10/02/22 7425    Parenteral Nutrition (CENTRAL)   Intravenous Continuous [name], MD 60 mL/hr at 10/01/22 2154 New Bag at 10/01/22 2154    phenol (CHLORASEPTIC) 1.4 % spray 2 spray  2 spray Mucous Membrane Q2H PRN Rocco Serene, MD   2 spray at 09/30/22 2034    [Provider Hold] polyethylene glycol (MIRALAX) packet 17 g  17 g Oral Daily PRN Terri Piedra, AGNP        promethazine (PHENERGAN) injection 6.25 mg  6.25 mg Intravenous Q6H PRN Rocco Serene, MD   6.25 mg at 10/02/22 1008         Objective:     Vital Signs    Temp:  [36.6 ??C (97.8 ??F)-37.1 ??C (98.8 ??F)] 36.6 ??C (97.8 ??F)  Heart Rate:  [69-91] 81  SpO2 Pulse:  [69-91] 80  Resp:  [9-16] 13  BP: (110-142)/(40-70) 111/40  MAP (mmHg):  [62-85] 62  SpO2:  [98 %-99 %] 98 %      Physical Exam     GENERAL:  Well developed, well-nourished female and looking uncomfortable from pain and NG tube discomfort.   HEAD/NECK:    Reveals normocephalic/atraumatic.   CARDIOVASCULAR:   Regular rate  LUNGS:   Normal work of breathing, no supplemental 02  EXTREMITIES:  Warm, no clubbing, cyanosis, or edema was noted.  NEUROLOGIC:    The patient was alert and oriented times four with normal language, attention, cognition and memory. Cranial nerve exam was grossly normal.    MUSCULOSKELETAL:    SKIN:  No obvious rashes lesions or erythema  PSY:  Appropriate affect and  mood.    Test Results    Lab Results   Component Value Date    CREATININE 0.48 (L) 10/02/2022     Lab Results   Component Value Date    ALKPHOS 68 10/02/2022    BILITOT 0.7 10/02/2022    BILIDIR <0.10 08/08/2022    PROT 6.0 10/02/2022    ALBUMIN 3.1 (L) 10/02/2022    ALT 17 10/02/2022    AST 21 10/02/2022           Problem List    Principal Problem:    Other acute postprocedural pain  Active Problems:    [name]    Intestinal polyps    [name]    Neoplasm related pain    Physical deconditioning    History of colectomy    Intractable abdominal pain    Generalized anxiety disorder with panic attacks    SBO (small bowel obstruction) (CMS-HCC)    Small intestinal bacterial overgrowth (SIBO)      [place], DO   PGY-5 Pain Fellow

## 2022-10-02 NOTE — Unmapped (Signed)
Luminal Gastroenterology Consult Service   Progress Note         Assessment and Recommendations:   [name] [name] is a 22 y.o. 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 The patient is seen in consultation at the request of [name], MD (Med [facility])) for concern for coffee ground emesis and ileus.     Ileus - Chronic abdominal pain  Patient with history of recurrent ileus and chronic abdominal pain, likely visceral hypersensitivity and opioid induced hyperalgesia. CT a/p 7/19 showed multiple dilated air and fluid containing bowel loops without a discrete transition point, indicative of ileus, not small bowel obstruction. Historical CT scans reviewed with evidence of chronic small bowel dilation. Abdominal exam subjectively distended with some diminished bowel sounds, otherwise reassuring. We suspect that her ileus is secondary to opioids which she has been receiving for her acute on chronic abdominal pain. Recent pouchoscopy and CT reassuring against structural etiologies of obstruction/SBO.   - Given abdominal XR without improvement and no bowel movement since 7/19, recommend trial of gut directed opioid antagonist.  --> Subcutaneous methylnaltrexone was previously not well tolerated.   --> Recommend trial of oral naloxegol 12.5 mg daily  --> Also recommend rectal suppositories or enemas to treat constipation  - Do not recommend Neostigmine, which generally is more effective for large bowel.  - Can try Reglan, but generally this is more directed towards the stomach.   - Continue supportive management with ongoing NG tube decompression, minimization of narcotics, and physical activity as able.   - Agree with consult to Chronic Pain team     Concern for Coffee-Ground Emesis,resolved  Patient with one day of coffee-ground emesis from NG tube on 7/21. NG tube output examined today with non-bloody clear yellow output. We suspect that the coffee-ground emesis seen earlier during admission was related to induced trauma from the NG tube with self-resolution. We are less concerned for alternative sources of an upper GIB (peptic ulcer, AMV, dieulafoy's lesion, etc) as recent EGD 01/2022 was largely unremarkable. Since she remains hemodynamically stable, her hemoglobin also remains stable at ~10, and given her prior challenges with anesthesia, we recommend ongoing conservative management. If she develops major bleeding with hemodynamic instability, we would consider a repeat upper endoscopy at that time.   - Continue BID PPI   - Attempt to remove NG as soon as able to minimize risk of trauma   - Avoid NSAIDS  - Daily CBC    Issues Impacting Complexity of Management:  -None    Recommendations discussed with the patient's primary team. We will continue to follow along with you.    Subjective:   Interval events: No further episodes of coffee ground emesis. Minimal NGT output. Reports significant nausea and abdominal pain disrupting sleep overnight. No bowel movement in the last 24 hours (last 7/19). Remains on TPN.     Objective:   Temp:  [1 ??C (33.8 ??F)-37.1 ??C (98.8 ??F)] 36.9 ??C (98.4 ??F)  Heart Rate:  [67-93] 85  SpO2 Pulse:  [67-96] 82  Resp:  [8-24] 15  BP: (102-124)/(43-67) 122/67  SpO2:  [98 %-100 %] 98 %      Pertinent Labs/Studies:    Recent  Labs     Units 09/29/22  1041 09/30/22  0613 10/01/22  0539   WBC 10*9/L 5.9 6.2 4.9   RBC 10*12/L 3.80* 3.69* 3.96   HGB g/dL 81.1* 9.7* 91.4*   HCT % 30.9* 29.7* 31.7*   MCV fL 81.3 80.5 79.9   MCH pg 26.4 26.2 25.9   MCHC g/dL 78.2 95.6 21.3   RDW % 12.7 12.5 12.5   PLT 10*9/L 203 199 207   MPV fL 9.3 9.2 9.3     Recent Labs     Units 09/29/22  0506 09/30/22  0613 10/01/22  0539   NA mmol/L 142 142 142   K mmol/L 3.6 3.7 4.0   CL mmol/L 110* 110* 109*   CO2 mmol/L 28.0 28.0 28.0   BUN mg/dL 8* 12 12   CREATININE mg/dL 0.86 5.78* 4.69*   GLU mg/dL 629 528 94   CALCIUM mg/dL 8.4* 8.5* 8.6*   ALBUMIN g/dL 3.0* 2.8* 3.0*   PROT g/dL 5.8 5.4* 5.7   BILITOT mg/dL 0.4 0.4 0.4   ALKPHOS U/L 63 57 64   ALT U/L 14 8* 11   AST U/L 17 14 15      KUB 10/01/22:  Unchanged compared to prior exam.     KUB 09/28/22  Multiple air-filled dilated loops of bowel in the central abdomen appear similar to slightly decreased in caliber. Given that the enteric contrast is now located within the region of the ileoanal anastomosis, findings are favored to represent ileus.     CT a/p with contrast 09/27/2022   Similar-appearing bowel pattern with multiple dilated air and fluid containing bowel loops without a discrete transition point which may be indicative of ileus.      Otherwise stable abdominopelvic findings compared to 09/26/2022     Pouchoscopy 03/2022  The perianal and digital rectal examinations were normal.       The neo-terminal ileum appeared normal.       A localized area of mucosa in the ileoanal pouch was mildly friable with        contact bleeding along the proximal suture lines. Coagulation for        bleeding prevention using argon beam at 0.3 liters/minute and 20 watts        was successful.       There was a rectal cuff beginning at at 1 cm from the anal verge,        characterized by healthy appearing mucosa. The cuff extended 1 cm in        length.    EGD 01/2022  No evidence of blood or source of hematochezia seen on upper endoscopy.       The esophagus was normal.       Multiple pedunculated and sessile polyps with no bleeding and no        stigmata of recent bleeding were found in the entire examined stomach.        Previously biopsied and consistent with adenomas       The examined duodenum was normal.       The examined jejunum was normal.

## 2022-10-02 NOTE — Unmapped (Signed)
Hospital Medicine Daily Progress Note    Assessment/Plan:    Principal Problem:    Other acute postprocedural pain  Active Problems:    [name]    Intestinal polyps    [name]    Neoplasm related pain    Physical deconditioning    History of colectomy    Intractable abdominal pain    Generalized anxiety disorder with panic attacks    SBO (small bowel obstruction) (CMS-HCC)    Small intestinal bacterial overgrowth (SIBO)  Resolved Problems:    * No resolved hospital problems. *   Malnutrition Evaluation as performed by RD, LDN: Patient does not meet AND/ASPEN criteria for malnutrition at this time (09/29/22 1439)             [name] Reather Converse is a 22 y.o. female with existing desmoid tumors, multiple cutaneous and intestinal tumors with given underlying gardener syndrome that presented to [facility] with Other acute postprocedural pain.    Ileus - Nausea and vomiting: abdominal pain and inability to tolerate any PO for several days. Imaging with multiple dilated air and fluid containing bowel loops without a discrete transition point which may be indicative of ileus. NG tube placed and started on TPN on 7/21. KUB shows improvement in bowel dilatation.  - GI medicine consult appreciated  - Nutrition consult for peripheral nutrition with TPN  - Limiting opiates as tolerated   - Continue IV phenergan  - Started Movantik to help with bowel mobility  - Ordered daily Dulcolax suppository as well    Muscle Spasms and Diffuse Pain: patient was undergoing IR guided procedure for desmoid tumors which included steroid injection under ultrasound guidance, afterwards developed severe pain and shaking/rigidity. Neurology consult appreciated and MRI spine returned unrevealing.   - Anesthesia Pain Consult appreciated - Lidocaine gtt completed afer 72 hours on 7/24  - IV ativan in place of home PO ativan  - IV Tylenol  - Providing lower dose IV Dilaudid prn and PO Oxycodone prn    Coffee Ground Emesis: noted in NG tube on 7/21  - Hgb stable without hemodynamic instability  - Continue PPI IV BID     [name]  - Currently holding home nirogacestat after discussion with Heme/Onc     Small intestinal bacterial overgrowth  - History of colectomy  - Holding home cefdinir and doxycycline for SIBO in setting of broad-spectrum antibiotics     History of desmoid tumors  - On nirogacestat and intermittent steroid injections with VIR   -GI desmoids involvement    ___________________________________________________________________    Subjective:  Patient states she was up all night with pain. Took suppository with small bowel movement last night and again this morning. Continuing NG tube due to reports of continued nausea and TPN infusing. Working with therapy services to improve mobility. Discussed patient with nursing staff and reviewed MAR.    I personally spent more than 50 minutes face-to-face and non-face-to-face in the care of this patient, which includes all pre, intra, and post visit time on the date of service.          Labs/Studies:  Labs and Studies from the last 24hrs per EMR and Reviewed    Objective:  Temp:  [36.4 ??C (97.5 ??F)-37.1 ??C (98.8 ??F)] 36.7 ??C (98.1 ??F)  Heart Rate:  [68-93] 69  SpO2 Pulse:  [69-96] 69  Resp:  [9-24] 13  BP: (109-142)/(40-70) 142/70  SpO2:  [98 %-99 %] 99 %    Physical Exam for 10/02/22  GEN: Lying in bed, appears comfortable.  HEENT: EOMI, NG tube in place.  CV: RRR, S1 and S2 normal.  LUNGS: CTA bilaterally. Normal WOB  ABD: Soft, tender to palpation throughout, ND, normoactive BS.  EXT: Trace LE edema, 2+ radial and pedal pulses bilaterally.

## 2022-10-03 LAB — CBC
HEMATOCRIT: 29.5 % — ABNORMAL LOW (ref 34.0–44.0)
HEMOGLOBIN: 10 g/dL — ABNORMAL LOW (ref 11.3–14.9)
MEAN CORPUSCULAR HEMOGLOBIN CONC: 34 g/dL (ref 32.0–36.0)
MEAN CORPUSCULAR HEMOGLOBIN: 26.8 pg (ref 25.9–32.4)
MEAN CORPUSCULAR VOLUME: 78.8 fL (ref 77.6–95.7)
MEAN PLATELET VOLUME: 9.5 fL (ref 6.8–10.7)
PLATELET COUNT: 176 10*9/L (ref 150–450)
RED BLOOD CELL COUNT: 3.74 10*12/L — ABNORMAL LOW (ref 3.95–5.13)
RED CELL DISTRIBUTION WIDTH: 12.6 % (ref 12.2–15.2)
WBC ADJUSTED: 5.1 10*9/L (ref 3.6–11.2)

## 2022-10-03 LAB — COMPREHENSIVE METABOLIC PANEL
ALBUMIN: 3.2 g/dL — ABNORMAL LOW (ref 3.4–5.0)
ALKALINE PHOSPHATASE: 68 U/L (ref 46–116)
ALT (SGPT): 18 U/L (ref 10–49)
ANION GAP: 3 mmol/L — ABNORMAL LOW (ref 5–14)
AST (SGOT): 22 U/L (ref <=34)
BILIRUBIN TOTAL: 0.5 mg/dL (ref 0.3–1.2)
BLOOD UREA NITROGEN: 14 mg/dL (ref 9–23)
BUN / CREAT RATIO: 29
CALCIUM: 9 mg/dL (ref 8.7–10.4)
CHLORIDE: 109 mmol/L — ABNORMAL HIGH (ref 98–107)
CO2: 28 mmol/L (ref 20.0–31.0)
CREATININE: 0.48 mg/dL — ABNORMAL LOW
EGFR CKD-EPI (2021) FEMALE: >90 mL/min/{1.73_m2} (ref >=60)
GLUCOSE RANDOM: 94 mg/dL (ref 70–179)
POTASSIUM: 4 mmol/L (ref 3.4–4.8)
PROTEIN TOTAL: 5.9 g/dL (ref 5.7–8.2)
SODIUM: 140 mmol/L (ref 135–145)

## 2022-10-03 LAB — PHOSPHORUS: PHOSPHORUS: 3.7 mg/dL (ref 2.4–5.1)

## 2022-10-03 LAB — MAGNESIUM: MAGNESIUM: 2 mg/dL (ref 1.6–2.6)

## 2022-10-03 MED ADMIN — HYDROmorphone (PF) injection Syrg 0.5 mg: .5 mg | INTRAVENOUS | @ 19:00:00 | Stop: 2022-10-04 | NDC 00409426411

## 2022-10-03 MED ADMIN — phenol (CHLORASEPTIC) 1.4 % spray 2 spray: 2 | @ 06:00:00 | NDC 96295013193

## 2022-10-03 MED ADMIN — HYDROmorphone (PF) injection Syrg 0.5 mg: .5 mg | INTRAVENOUS | @ 11:00:00 | Stop: 2022-10-04 | NDC 00409426411

## 2022-10-03 MED ADMIN — pantoprazole (Protonix) injection 40 mg: 40 mg | INTRAVENOUS | @ 13:00:00 | NDC 82009001190

## 2022-10-03 MED ADMIN — LORazepam (ATIVAN) injection 0.5 mg: .5 mg | INTRAVENOUS | @ 21:00:00 | NDC 76329826101

## 2022-10-03 MED ADMIN — lactated Ringers infusion: 50 mL/h | INTRAVENOUS | @ 10:00:00 | Stop: 2022-10-03 | NDC 11845118709

## 2022-10-03 MED ADMIN — acetaminophen (OFIRMEV) 10 mg/mL injection 1,000 mg: 1000 mg | INTRAVENOUS | @ 14:00:00 | Stop: 2022-10-04 | NDC 68094059462

## 2022-10-03 MED ADMIN — HYDROmorphone (PF) injection Syrg 0.5 mg: .5 mg | INTRAVENOUS | @ 04:00:00 | Stop: 2022-10-04 | NDC 00409426411

## 2022-10-03 MED ADMIN — famotidine (PF) (PEPCID) injection 20 mg: 20 mg | INTRAVENOUS | NDC 96619043921

## 2022-10-03 MED ADMIN — ondansetron (ZOFRAN) injection 4 mg: 4 mg | INTRAVENOUS | @ 21:00:00 | NDC 71930001752

## 2022-10-03 MED ADMIN — promethazine (PHENERGAN) injection 6.25 mg: 6.25 mg | INTRAVENOUS | @ 04:00:00 | NDC 60977000143

## 2022-10-03 MED ADMIN — ondansetron (ZOFRAN) injection 4 mg: 4 mg | INTRAVENOUS | @ 02:00:00 | NDC 71930001752

## 2022-10-03 MED ADMIN — HYDROmorphone (PF) injection Syrg 0.5 mg: .5 mg | INTRAVENOUS | @ 14:00:00 | Stop: 2022-10-03 | NDC 00409426411

## 2022-10-03 MED ADMIN — promethazine (PHENERGAN) injection 6.25 mg: 6.25 mg | INTRAVENOUS | @ 14:00:00 | NDC 60977000143

## 2022-10-03 MED ADMIN — sodium chloride 0.45% (1/2 NS) infusion: 100 mL/h | INTRAVENOUS | @ 19:00:00 | NDC 68258901501

## 2022-10-03 MED ADMIN — acetaminophen (OFIRMEV) 10 mg/mL injection 1,000 mg: 1000 mg | INTRAVENOUS | @ 21:00:00 | Stop: 2022-10-04 | NDC 68094059462

## 2022-10-03 MED ADMIN — acetaminophen (OFIRMEV) 10 mg/mL injection 1,000 mg: 1000 mg | INTRAVENOUS | Stop: 2022-10-03 | NDC 68094059462

## 2022-10-03 MED ADMIN — LORazepam (ATIVAN) injection 0.5 mg: .5 mg | INTRAVENOUS | @ 06:00:00 | NDC 76329826101

## 2022-10-03 MED ADMIN — naloxegol (MOVANTIK) 12.5 mg tablet 12.5 mg: 12.5 mg | ORAL | @ 14:00:00 | NDC 82625880101

## 2022-10-03 MED ADMIN — buprenorphine-naloxone (SUBOXONE) 2-0.5 mg SL film 0.5 mg of buprenorphine: .25 | SUBLINGUAL | @ 17:00:00 | NDC 52427069211

## 2022-10-03 MED ADMIN — buprenorphine-naloxone (SUBOXONE) 2-0.5 mg SL film 0.5 mg of buprenorphine: .25 | SUBLINGUAL | @ 21:00:00 | NDC 52427069211

## 2022-10-03 MED ADMIN — buprenorphine-naloxone (SUBOXONE) 2-0.5 mg SL film 0.5 mg of buprenorphine: .25 | SUBLINGUAL | @ 13:00:00 | NDC 52427069211

## 2022-10-03 MED ADMIN — LORazepam (ATIVAN) injection 0.5 mg: .5 mg | INTRAVENOUS | @ 13:00:00 | NDC 76329826101

## 2022-10-03 MED ADMIN — fat emulsion 20 % with fish oil (SMOFlipid) infusion 250 mL: 250 mL | INTRAVENOUS | @ 02:00:00 | Stop: 2022-10-03 | NDC 63323082074

## 2022-10-03 MED ADMIN — pantoprazole (Protonix) injection 40 mg: 40 mg | INTRAVENOUS | NDC 82009001190

## 2022-10-03 MED ADMIN — acetaminophen (OFIRMEV) 10 mg/mL injection 1,000 mg: 1000 mg | INTRAVENOUS | @ 07:00:00 | Stop: 2022-10-03 | NDC 68094059462

## 2022-10-03 MED ADMIN — Parenteral Nutrition (CENTRAL): INTRAVENOUS | @ 02:00:00 | Stop: 2022-10-03 | NDC 96295013582

## 2022-10-03 NOTE — Unmapped (Signed)
Department of Anesthesiology  Pain Medicine Division    Chronic Pain Followup Inpatient Consult Note    Requesting Attending Physician:  [name], MD  Service Requesting Consult:  Med Bern[facility] R[facility]in [facility])    Assessment/Recommendations:    The patient was seen in consultation on request of Rocco Serene, MD regarding assistance with pain management for patient's chronic intractable abdominal pain with now acute on chronic recurrent ileus/NG tube placement.  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      Patient currently established with [facility] pain management center Dr. Dyann Ruddle, has been on oxycodone 10 mg 3 times daily, Suboxone 0.5 mg 3 times daily along with baclofen and pregabalin.  Through her psychiatrist she is on Ativan 0.5 mg 3 times daily as needed.      Patient has had multiple prior inpatient stays either regarding acute exacerbation of chronic pain or ileus.  In the past 12 months she has had 2 infusions of ketamine with limited benefit (last infusion in May 2024) and at one point there was some transaminitis which was thought to be attributed from other etiologies like TPN versus other intra-abdominal pathologies.  In the past patient also has had lidocaine infusions during inpatient stay with limited/variable benefit.           Regarding ileus and her constipation, After discussion with the patient and mother we came to an agreement to attempt with a low-dose of naloxegol 12.5 mg, she has taken enterag around surgery time before and seemed to tolerate it. GI team recommended against neostigmine at this time. I would expect the naloxegol to increase her abdominal pain at first but hope that we can continue it and even increase to 25mg  if able.     This is no doubt a very complex situation. Her biggest issue at this point seems to be the ileus. Medication management should focus on non-opioid medications, although she has trialed others in past with side effects or minimal benefit.  Hopefully minimizing opioids and working on her ileus will help get her out of the hospital quicker than previously.           Interval: She is overall stable. She is working well with therapy. We continued to encourage her and celebrated her small victories. We dicussed continuing therapy and working with speech therapy for oral desensitization which the patient is opened to. We do not recommend any changes to her medications.       Recommendations:  -The chronic pain service is a consult service and does not place orders, just makes recommendations (except ketamine and lidocaine infusions)   -Please evaluate all patients on opioids for appropriateness of prescribing narcan at discharge.  The chronic pain service can assist with this.  Nasal narcan is covered by most insurances.  -Recommendations given apply to the current hospitalization and do not reflect long term recommendations.    -discontinued lidocaine infusion at scheduled time.  09/28/22 - 10/01/22  - continue IV tylenol 1000mg  TID as needed   -Agree with GI for starting naloxegol 12.5 mg p.o. daily.  GI following - had some bowel output   -Restart home meds whenever able to take PO medications.  Could schedule baclofen and increase pregabalin.  -recommend continued physical therapy to work on bed level mobility   - would recommend speech therapy consult to work on oral desensitization as  she has not been able to tolerate anyone in her mouth such as ice chips or small sips of water         We will continue to follow.    Naloxone Rx at discharge?  Is patient on opioids? Yes.  1)Is dose >50MME?  Yes.  2) Is patient prescribed a benzodiazepine (w opioids)? Yes.  3)Hx of overdose?  No.  4) Hx of substance use disorder? No.  5) Opioids likely to last greater than a week after discharge? Yes.       If yes to 2 or more, prescribe naloxone at discharge.  Nasal narcan for most insured (Nasal narcan 4mg /actuation, prescribe 1 kit, instructions at [email or web address]).  For uninsured, chronic pain can work to assist in finding an option.  OTC nasal narcan now available at most pharmacies for around $45.    Interim History  There were no Acute Events Overnight.  The patient is not obtaining adequate pain relief on current medication regimen and feels that their pain is Somewhat controlled    Today patient reports pain is the same, although she is working with therapy, walking 15 feet with RW and making some functional improvement.     Analgesia Evaluation:  Pain at minimum: 5/10  Pain at maximum: 8/10    Current pain medication regimen (including how frequent PRN's were used):  Suboxone 2-0.5mg  TID   Lidocaine infusion 1mg /kg/hr 72 hours   Tylenol IV   Baclofen 10mg  TID on hold   Robaxin IV 250mg  TID   Oxycodone 15mg  q4h PRN (on hold)     Inpatient Medications  Current Facility-Administered Medications   Medication Dose Route Frequency Provider Last Rate Last Admin    acetaminophen (OFIRMEV) 10 mg/mL injection 1,000 mg  1,000 mg Intravenous Q8H Caulfield, Demetria Pore, MD        [Provider Hold] baclofen (LIORESAL) tablet 10 mg  10 mg Oral TID PRN Rocco Serene, MD   10 mg at 09/27/22 1639    bisacodyl (DULCOLAX) suppository 10 mg  10 mg Rectal Daily PRN [name], MD   10 mg at 10/01/22 2138    buprenorphine-naloxone (SUBOXONE) 2-0.5 mg SL film 0.5 mg of buprenorphine  0.25 Film Sublingual TID Terri Piedra, AGNP   0.5 mg of buprenorphine at 10/03/22 4696    calcium carbonate (TUMS) chewable tablet 400 mg elem calcium  400 mg elem calcium Oral Daily PRN Terri Piedra, AGNP        [Provider Hold] cefdinir (OMNICEF) capsule 300 mg  300 mg Oral Q12H [facility] Rocco Serene, MD        [Provider Hold] doxycycline (VIBRA-TABS) tablet 100 mg  100 mg Oral BID Rocco Serene, MD        famotidine (PF) (PEPCID) injection 20 mg  20 mg Intravenous At bedtime Rocco Serene, MD   20 mg at 10/02/22 2022    fat emulsion 20 % (INTRAlipid) infusion 100 mL  100 mL Intravenous Once PRN Omar Person, MD        fat emulsion 20 % (INTRAlipid) infusion 200 mL  200 mL Intravenous Once PRN Lobonc, Ammie Ferrier, MD        Parenteral Nutrition (CENTRAL)   Intravenous Continuous [name], MD 45 mL/hr at 10/03/22 0400 Rate Verify at 10/03/22 0400    And    fat emulsion 20 % with fish oil (SMOFlipid) infusion 250 mL  250 mL Intravenous Continuous [name], MD  Stopped at 10/03/22 6063    Parenteral Nutrition (CENTRAL)   Intravenous Continuous [name], MD        And    fat emulsion 20 % with fish oil (SMOFlipid) infusion 250 mL  250 mL Intravenous Continuous [name], MD        HYDROmorphone (PF) injection Syrg 0.5 mg  0.5 mg Intravenous Q6H PRN [name], MD   0.5 mg at 10/03/22 0640    HYDROmorphone (PF) injection Syrg 0.5 mg  0.5 mg Intravenous Once [name], MD        lactated Ringers infusion  50 mL/hr Intravenous Continuous Rocco Serene, MD 50 mL/hr at 10/03/22 0553 50 mL/hr at 10/03/22 0553    LORazepam (ATIVAN) injection 0.5 mg  0.5 mg Intravenous Q6H PRN Rocco Serene, MD   0.5 mg at 10/03/22 0160    [Provider Hold] LORazepam (ATIVAN) tablet 0.5 mg  0.5 mg Oral Q6H PRN Rocco Serene, MD        [Provider Hold] melatonin tablet 3 mg  3 mg Oral Nightly PRN Terri Piedra, AGNP        [Provider Hold] mirtazapine (REMERON) tablet 15 mg  15 mg Oral Nightly Terri Piedra, AGNP   15 mg at 09/26/22 2316    naloxegol (MOVANTIK) 12.5 mg tablet 12.5 mg  12.5 mg Oral Daily [name], MD   12.5 mg at 10/03/22 0937    ondansetron (ZOFRAN) injection 4 mg  4 mg Intravenous Q6H PRN Terri Piedra, AGNP   4 mg at 10/02/22 2224    oxyCODONE (ROXICODONE) immediate release tablet 15 mg  15 mg Oral Q4H PRN [name], MD   15 mg at 09/27/22 1410    pantoprazole (Protonix) injection 40 mg  40 mg Intravenous BID Rocco Serene, MD   40 mg at 10/03/22 0916    phenol (CHLORASEPTIC) 1.4 % spray 2 spray  2 spray Mucous Membrane Q2H PRN Rocco Serene, MD   2 spray at 10/03/22 0138    [Provider Hold] polyethylene glycol (MIRALAX) packet 17 g  17 g Oral Daily PRN Terri Piedra, AGNP        promethazine (PHENERGAN) injection 6.25 mg  6.25 mg Intravenous Q6H PRN Rocco Serene, MD   6.25 mg at 10/03/22 1093         Objective:     Vital Signs    Temp:  [36.5 ??C (97.7 ??F)-36.9 ??C (98.5 ??F)] 36.5 ??C (97.7 ??F)  Heart Rate:  [71-113] 71  SpO2 Pulse:  [70-113] 70  Resp:  [10-18] 10  BP: (100-139)/(40-66) 105/43  MAP (mmHg):  [59-84] 59  SpO2:  [96 %-100 %] 99 %      Physical Exam     GENERAL:  Well developed, well-nourished female and looking uncomfortable from pain and NG tube discomfort.   HEAD/NECK:    Reveals normocephalic/atraumatic.   CARDIOVASCULAR:   Regular rate  LUNGS:   Normal work of breathing, no supplemental 02  EXTREMITIES:  Warm, no clubbing, cyanosis, or edema was noted.  NEUROLOGIC:    The patient was alert and oriented times four with normal language, attention, cognition and memory. Cranial nerve exam was grossly normal.    MUSCULOSKELETAL:    SKIN:  No obvious rashes lesions or erythema  PSY:  Appropriate affect and mood.    Test Results    Lab Results   Component Value Date    CREATININE  0.48 (L) 10/03/2022     Lab Results   Component Value Date    ALKPHOS 68 10/03/2022    BILITOT 0.5 10/03/2022    BILIDIR <0.10 08/08/2022    PROT 5.9 10/03/2022    ALBUMIN 3.2 (L) 10/03/2022    ALT 18 10/03/2022    AST 22 10/03/2022           Problem List    Principal Problem:    Other acute postprocedural pain  Active Problems:    [name]    Intestinal polyps    [name]    Neoplasm related pain    Physical deconditioning    History of colectomy    Intractable abdominal pain    Generalized anxiety disorder with panic attacks    SBO (small bowel obstruction) (CMS-HCC)    Small intestinal bacterial overgrowth (SIBO)      [place], DO   PGY-5 Pain Fellow

## 2022-10-03 NOTE — Unmapped (Signed)
Patient is alert to drowsy, oriented x 4. VSS on 2L [place]. Complaints of pain well controlled with PRN dilaudid. PRN zofran and phenergan given for nausea with good relief. PRN ativan given x 2 for anxiety this shift. 1 small BM this morning. Adequate urine output. Patient ambulating to bathroom with assistance, worked with PT this afternoon. Patient remains NPO with NGT to low intermittent wall suction. About 250 mL output this shift. TPN infusing as ordered. Mom at bedside throughout shift. Free from falls and injury. Call bell within reach. See flowsheets/MAR for further info.     Problem: Adult Inpatient Plan of Care  Goal: Optimal Comfort and Wellbeing  Outcome: Ongoing - Unchanged  Goal: Readiness for Transition of Care  Outcome: Ongoing - Unchanged     Problem: Self-Care Deficit  Goal: Improved Ability to Complete Activities of Daily Living  Outcome: Progressing

## 2022-10-03 NOTE — Unmapped (Signed)
Problem: Adult Inpatient Plan of Care  Goal: Plan of Care Review  Outcome: Progressing  Goal: Patient-Specific Goal (Individualized)  Outcome: Progressing  Goal: Absence of Hospital-Acquired Illness or Injury  Outcome: Progressing  Goal: Optimal Comfort and Wellbeing  Outcome: Progressing  Goal: Readiness for Transition of Care  Outcome: Progressing  Goal: Rounds/Family Conference  Outcome: Progressing     Problem: Latex Allergy  Goal: Absence of Allergy Symptoms  Outcome: Progressing     Problem: Fall Injury Risk  Goal: Absence of Fall and Fall-Related Injury  Outcome: Progressing     Problem: Skin Injury Risk Increased  Goal: Skin Health and Integrity  Outcome: Progressing     Problem: Self-Care Deficit  Goal: Improved Ability to Complete Activities of Daily Living  Outcome: Progressing

## 2022-10-03 NOTE — Unmapped (Signed)
Hospital Medicine Daily Progress Note    Assessment/Plan:    Principal Problem:    Other acute postprocedural pain  Active Problems:    [name]    Intestinal polyps    [name]    Neoplasm related pain    Physical deconditioning    History of colectomy    Intractable abdominal pain    Generalized anxiety disorder with panic attacks    SBO (small bowel obstruction) (CMS-HCC)    Small intestinal bacterial overgrowth (SIBO)  Resolved Problems:    * No resolved hospital problems. *   Malnutrition Evaluation as performed by RD, LDN: Patient does not meet AND/ASPEN criteria for malnutrition at this time (09/29/22 1439)             [name] [name] is a 22 y.o. female with existing desmoid tumors, multiple cutaneous and intestinal tumors with given underlying gardener syndrome that presented to [facility] with Other acute postprocedural pain.    Ileus - Nausea and vomiting: abdominal pain and inability to tolerate any PO for several days. Imaging with multiple dilated air and fluid containing bowel loops without a discrete transition point which may be indicative of ileus. NG tube placed and started on TPN on 7/21. KUB shows improvement in bowel dilatation.  - GI medicine consult appreciated  - Will trial clamping of NG tube overnight to evaluate symptoms, unclamp if nausea/vomiting/abdominal pain  - Nutrition consult for peripheral nutrition with TPN  - Limiting opiates as tolerated   - Continue IV phenergan  - Continue Movantik to help with bowel mobility  - Ordered daily Dulcolax suppository as well    Muscle Spasms and Diffuse Pain: patient was undergoing IR guided procedure for desmoid tumors which included steroid injection under ultrasound guidance, afterwards developed severe pain and shaking/rigidity. Neurology consult appreciated and MRI spine returned unrevealing.   - Anesthesia Pain Consult appreciated - Lidocaine gtt completed afer 72 hours on 7/24  - IV ativan in place of home PO ativan  - IV Tylenol (reordered daily)  - Providing lower dose IV Dilaudid prn and PO Oxycodone prn    Coffee Ground Emesis: noted in NG tube on 7/21  - Hgb stable without hemodynamic instability  - Continue PPI IV BID     [name]  - Currently holding home nirogacestat after discussion with Heme/Onc  - Receiving support from Child psychotherapist with [facility]      Small intestinal bacterial overgrowth  - History of colectomy  - Holding home cefdinir and doxycycline for SIBO in setting of broad-spectrum antibiotics     History of desmoid tumors  - On nirogacestat and intermittent steroid injections with VIR   -GI desmoids involvement    ___________________________________________________________________    Subjective:  Patient reports small bowel movement over the past 24 hours. Still with significant pain and continued nausea. Patient hesitant to make too many quick changes. Discussed trialing a clamp of NG tube today. Discussed patient with nursing staff and reviewed MAR.    I personally spent more than 50 minutes face-to-face and non-face-to-face in the care of this patient, which includes all pre, intra, and post visit time on the date of service.      Labs/Studies:  Labs and Studies from the last 24hrs per EMR and Reviewed    Objective:  Temp:  [36.6 ??C (97.8 ??F)-36.9 ??C (98.5 ??F)] 36.9 ??C (98.5 ??F)  Heart Rate:  [73-113] 73  SpO2 Pulse:  [73-113] 73  Resp:  [  10-18] 11  BP: (100-139)/(40-66) 116/46  SpO2:  [96 %-100 %] 99 %    Physical Exam for 10/03/22  GEN: Lying in bed, appears comfortable.  HEENT: EOMI, NG tube in place.  CV: RRR, S1 and S2 normal.  LUNGS: CTA bilaterally. Normal WOB  ABD: Soft, tender to palpation throughout, ND, normoactive BS.  EXT: Trace LE edema, 2+ radial and pedal pulses bilaterally.

## 2022-10-03 NOTE — Unmapped (Signed)
[facility] Visit     Service date: October 03, 2022    Encounter location: Inpatient - Adult     Clinician: [name], LCSW - AYA Clinical Social Worker     Patient identifiers: [name] [name] is a 22 y.o. with desmoid tumors and [name]. [name] [name] uses she/her pronouns.      Visit Summary     Met with the patient for evaluation of needs that may be addressed by [facility].    Engaged [name] [name] in ongoing assessment and counseling related to challenges of health issues, medical trauma, disrupted developmental milestones, and depression and anxiety.     She and her mom both note that she has been highly agitated and exhausted the past two days. Her mom wonders if Movantik could be contributing to agitation. Provided psychoeducation on anxiety, nervous system hyperactivity, and management strategies. Encouraged them to bring up concerns with team and inquire about supportive medications.    Additionally, discussed worsening anxiety and depression with Dr. Malcolm Metro, who follows her outpatient for psychiatry. Dr. Malcolm Metro recommended an inpatient psychiatry consult, which [name] [name] is open to. Messaged Dr. Collie Siad to request consult to psychiatry.    Follow Up/Plan:     Will see [name] next week.    I will continue to follow this patient for AYA-appropriate support. They have my contact information and have been encouraged to contact me as needed.     Flowsheet     AYA Assessment  Primary Caregiver: Parent  Location Seen: Inpatient  Contact Point: During treatment  Referral Source: AYA team  Issues Discussed: Mental Health  AYA Team Interventions: Supportive counseling, Care coordination  Referrals Made: Psychiatry  Follow Up: 1 week  Time Spent (in minutes): 110    10/03/2022     [name], LCSW  Adolescent [facility] Young Adult Visual merchandiser

## 2022-10-04 LAB — CBC
HEMATOCRIT: 29.2 % — ABNORMAL LOW (ref 34.0–44.0)
HEMOGLOBIN: 9.6 g/dL — ABNORMAL LOW (ref 11.3–14.9)
MEAN CORPUSCULAR HEMOGLOBIN CONC: 32.7 g/dL (ref 32.0–36.0)
MEAN CORPUSCULAR HEMOGLOBIN: 26.2 pg (ref 25.9–32.4)
MEAN CORPUSCULAR VOLUME: 80.1 fL (ref 77.6–95.7)
MEAN PLATELET VOLUME: 9.8 fL (ref 6.8–10.7)
PLATELET COUNT: 182 10*9/L (ref 150–450)
RED BLOOD CELL COUNT: 3.64 10*12/L — ABNORMAL LOW (ref 3.95–5.13)
RED CELL DISTRIBUTION WIDTH: 12.5 % (ref 12.2–15.2)
WBC ADJUSTED: 4.8 10*9/L (ref 3.6–11.2)

## 2022-10-04 LAB — COMPREHENSIVE METABOLIC PANEL
ALBUMIN: 3.1 g/dL — ABNORMAL LOW (ref 3.4–5.0)
ALKALINE PHOSPHATASE: 69 U/L (ref 46–116)
ALT (SGPT): 16 U/L (ref 10–49)
ANION GAP: 4 mmol/L — ABNORMAL LOW (ref 5–14)
AST (SGOT): 19 U/L (ref <=34)
BILIRUBIN TOTAL: 0.3 mg/dL (ref 0.3–1.2)
BLOOD UREA NITROGEN: 12 mg/dL (ref 9–23)
BUN / CREAT RATIO: 29
CALCIUM: 8.8 mg/dL (ref 8.7–10.4)
CHLORIDE: 108 mmol/L — ABNORMAL HIGH (ref 98–107)
CO2: 28 mmol/L (ref 20.0–31.0)
CREATININE: 0.42 mg/dL — ABNORMAL LOW
EGFR CKD-EPI (2021) FEMALE: >90 mL/min/{1.73_m2} (ref >=60)
GLUCOSE RANDOM: 105 mg/dL (ref 70–179)
POTASSIUM: 3.5 mmol/L (ref 3.4–4.8)
PROTEIN TOTAL: 5.9 g/dL (ref 5.7–8.2)
SODIUM: 140 mmol/L (ref 135–145)

## 2022-10-04 LAB — PHOSPHORUS: PHOSPHORUS: 3.7 mg/dL (ref 2.4–5.1)

## 2022-10-04 LAB — MAGNESIUM: MAGNESIUM: 1.9 mg/dL (ref 1.6–2.6)

## 2022-10-04 MED ADMIN — ondansetron (ZOFRAN) injection 4 mg: 4 mg | INTRAVENOUS | @ 09:00:00 | NDC 71930001752

## 2022-10-04 MED ADMIN — buprenorphine-naloxone (SUBOXONE) 2-0.5 mg SL film 0.5 mg of buprenorphine: .25 | SUBLINGUAL | @ 13:00:00 | Stop: 2022-10-04 | NDC 52427069211

## 2022-10-04 MED ADMIN — oxyCODONE (ROXICODONE) immediate release tablet 15 mg: 15 mg | ORAL | @ 23:00:00 | Stop: 2022-10-09 | NDC 72865012805

## 2022-10-04 MED ADMIN — promethazine (PHENERGAN) injection 6.25 mg: 6.25 mg | INTRAVENOUS | @ 07:00:00 | NDC 60977000143

## 2022-10-04 MED ADMIN — naloxegol (MOVANTIK) 12.5 mg tablet 12.5 mg: 12.5 mg | ORAL | @ 13:00:00 | NDC 82625880101

## 2022-10-04 MED ADMIN — LORazepam (ATIVAN) injection 0.5 mg: .5 mg | INTRAVENOUS | @ 23:00:00 | NDC 76329826101

## 2022-10-04 MED ADMIN — acetaminophen (OFIRMEV) 10 mg/mL injection 1,000 mg: 1000 mg | INTRAVENOUS | @ 07:00:00 | Stop: 2022-10-04 | NDC 68094059462

## 2022-10-04 MED ADMIN — pantoprazole (Protonix) injection 40 mg: 40 mg | INTRAVENOUS | @ 13:00:00 | NDC 82009001190

## 2022-10-04 MED ADMIN — promethazine (PHENERGAN) injection 6.25 mg: 6.25 mg | INTRAVENOUS | @ 13:00:00 | NDC 60977000143

## 2022-10-04 MED ADMIN — promethazine (PHENERGAN) injection 6.25 mg: 6.25 mg | INTRAVENOUS | @ 02:00:00 | NDC 60977000143

## 2022-10-04 MED ADMIN — LORazepam (ATIVAN) injection 0.5 mg: .5 mg | INTRAVENOUS | @ 03:00:00 | NDC 76329826101

## 2022-10-04 MED ADMIN — Parenteral Nutrition (CENTRAL): INTRAVENOUS | @ 02:00:00 | Stop: 2022-10-04 | NDC 96295013582

## 2022-10-04 MED ADMIN — HYDROmorphone (PF) injection Syrg 0.5 mg: .5 mg | INTRAVENOUS | @ 02:00:00 | Stop: 2022-10-04 | NDC 00409426411

## 2022-10-04 MED ADMIN — HYDROmorphone (PF) injection Syrg 0.5 mg: .5 mg | INTRAVENOUS | @ 09:00:00 | Stop: 2022-10-04 | NDC 00409426411

## 2022-10-04 MED ADMIN — fat emulsion 20 % with fish oil (SMOFlipid) infusion 250 mL: 250 mL | INTRAVENOUS | @ 02:00:00 | Stop: 2022-10-04 | NDC 63323082074

## 2022-10-04 MED ADMIN — oxyCODONE (ROXICODONE) immediate release tablet 15 mg: 15 mg | ORAL | @ 17:00:00 | Stop: 2022-10-09 | NDC 72865012805

## 2022-10-04 MED ADMIN — buprenorphine-naloxone (SUBOXONE) 2-0.5 mg SL film 0.5 mg of buprenorphine: .25 | SUBLINGUAL | @ 18:00:00 | Stop: 2022-10-04 | NDC 52427069211

## 2022-10-04 MED ADMIN — HYDROmorphone (PF) injection Syrg 0.5 mg: .5 mg | INTRAVENOUS | @ 13:00:00 | Stop: 2022-10-04 | NDC 00409426411

## 2022-10-04 MED ADMIN — ondansetron (ZOFRAN) injection 4 mg: 4 mg | INTRAVENOUS | @ 23:00:00 | NDC 71930001752

## 2022-10-04 MED ADMIN — famotidine (PF) (PEPCID) injection 20 mg: 20 mg | INTRAVENOUS | @ 02:00:00 | NDC 96619043921

## 2022-10-04 MED ADMIN — promethazine (PHENERGAN) injection 6.25 mg: 6.25 mg | INTRAVENOUS | @ 23:00:00 | NDC 60977000143

## 2022-10-04 MED ADMIN — HYDROmorphone (PF) injection Syrg 0.5 mg: .5 mg | INTRAVENOUS | @ 07:00:00 | Stop: 2022-10-04 | NDC 00409426411

## 2022-10-04 MED ADMIN — pantoprazole (Protonix) injection 40 mg: 40 mg | INTRAVENOUS | @ 02:00:00 | NDC 82009001190

## 2022-10-04 MED ADMIN — LORazepam (ATIVAN) injection 0.5 mg: .5 mg | INTRAVENOUS | @ 18:00:00 | NDC 76329826101

## 2022-10-04 NOTE — Unmapped (Signed)
Problem: Adult Inpatient Plan of Care  Goal: Plan of Care Review  10/03/2022 1844 by Aggie Moats, RN  Outcome: Progressing  10/03/2022 0802 by Aggie Moats, RN  Outcome: Progressing  Goal: Patient-Specific Goal (Individualized)  10/03/2022 1844 by Aggie Moats, RN  Outcome: Progressing  10/03/2022 0802 by Aggie Moats, RN  Outcome: Progressing  Goal: Absence of Hospital-Acquired Illness or Injury  10/03/2022 1844 by Aggie Moats, RN  Outcome: Progressing  10/03/2022 0802 by Aggie Moats, RN  Outcome: Progressing  Intervention: Identify and Manage Fall Risk  Recent Flowsheet Documentation  Taken 10/03/2022 0800 by Aggie Moats, RN  Safety Interventions:   bed alarm   fall reduction program maintained   lighting adjusted for tasks/safety  Intervention: Prevent Skin Injury  Recent Flowsheet Documentation  Taken 10/03/2022 0800 by Aggie Moats, RN  Positioning for Skin: Supine/Back  Device Skin Pressure Protection: adhesive use limited  Skin Protection: adhesive use limited  Intervention: Prevent Infection  Recent Flowsheet Documentation  Taken 10/03/2022 0800 by Aggie Moats, RN  Infection Prevention: environmental surveillance performed  Goal: Optimal Comfort and Wellbeing  10/03/2022 1844 by Aggie Moats, RN  Outcome: Progressing  10/03/2022 0802 by Aggie Moats, RN  Outcome: Progressing  Goal: Readiness for Transition of Care  10/03/2022 1844 by Aggie Moats, RN  Outcome: Progressing  10/03/2022 0802 by Aggie Moats, RN  Outcome: Progressing  Goal: Rounds/Family Conference  10/03/2022 1844 by Aggie Moats, RN  Outcome: Progressing  10/03/2022 0802 by Aggie Moats, RN  Outcome: Progressing     Problem: Latex Allergy  Goal: Absence of Allergy Symptoms  10/03/2022 1844 by Aggie Moats, RN  Outcome: Progressing  10/03/2022 0802 by Aggie Moats, RN  Outcome: Progressing     Problem: Fall Injury Risk  Goal: Absence of Fall and Fall-Related Injury  10/03/2022 1844 by Aggie Moats, RN  Outcome: Progressing  10/03/2022 0802 by Aggie Moats, RN  Outcome: Progressing  Intervention: Promote Injury-Free Environment  Recent Flowsheet Documentation  Taken 10/03/2022 0800 by Aggie Moats, RN  Safety Interventions:   bed alarm   fall reduction program maintained   lighting adjusted for tasks/safety     Problem: Skin Injury Risk Increased  Goal: Skin Health and Integrity  10/03/2022 1844 by Aggie Moats, RN  Outcome: Progressing  10/03/2022 0802 by Aggie Moats, RN  Outcome: Progressing  Intervention: Optimize Skin Protection  Recent Flowsheet Documentation  Taken 10/03/2022 0800 by Aggie Moats, RN  Pressure Reduction Techniques: frequent weight shift encouraged  Pressure Reduction Devices: pressure-redistributing mattress utilized  Skin Protection: adhesive use limited     Problem: Self-Care Deficit  Goal: Improved Ability to Complete Activities of Daily Living  10/03/2022 1844 by Aggie Moats, RN  Outcome: Progressing  10/03/2022 0802 by Aggie Moats, RN  Outcome: Progressing

## 2022-10-04 NOTE — Unmapped (Signed)
Problem: Adult Inpatient Plan of Care  Goal: Plan of Care Review  Outcome: Progressing  Goal: Patient-Specific Goal (Individualized)  Outcome: Progressing  Goal: Absence of Hospital-Acquired Illness or Injury  Outcome: Progressing  Goal: Optimal Comfort and Wellbeing  Outcome: Progressing  Goal: Readiness for Transition of Care  Outcome: Progressing  Goal: Rounds/Family Conference  Outcome: Progressing     Problem: Latex Allergy  Goal: Absence of Allergy Symptoms  Outcome: Progressing     Problem: Fall Injury Risk  Goal: Absence of Fall and Fall-Related Injury  Outcome: Progressing     Problem: Skin Injury Risk Increased  Goal: Skin Health and Integrity  Outcome: Progressing     Problem: Self-Care Deficit  Goal: Improved Ability to Complete Activities of Daily Living  Outcome: Progressing

## 2022-10-04 NOTE — Unmapped (Signed)
Department of Anesthesiology  Pain Medicine Division    Chronic Pain Followup Inpatient Consult Note    Requesting Attending Physician:  Lambert Mody, MD PhD  Service Requesting Consult:  [facility])    Assessment/Recommendations:    The patient was seen in consultation on request of Rocco Serene, MD regarding assistance with pain management for patient's chronic intractable abdominal pain with now acute on chronic recurrent ileus/NG tube placement.  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      Patient currently established with [facility] pain management center Dr. Dyann Ruddle, has been on oxycodone 10 mg 3 times daily, Suboxone 0.5 mg 3 times daily along with baclofen and pregabalin.  Through her psychiatrist she is on Ativan 0.5 mg 3 times daily as needed.      Patient has had multiple prior inpatient stays either regarding acute exacerbation of chronic pain or ileus.  In the past 12 months she has had 2 infusions of ketamine with limited benefit (last infusion in May 2024) and at one point there was some transaminitis which was thought to be attributed from other etiologies like TPN versus other intra-abdominal pathologies.  In the past patient also has had lidocaine infusions during inpatient stay with limited/variable benefit.           Regarding ileus and her constipation, After discussion with the patient and mother we came to an agreement to attempt with a low-dose of naloxegol 12.5 mg, she has taken enterag around surgery time before and seemed to tolerate it. GI team recommended against neostigmine at this time. I would expect the naloxegol to increase her abdominal pain at first but hope that we can continue it and even increase to 25mg  if able.     This is no doubt a very complex situation. Her biggest issue at this point seems to be the ileus.   Medication management should focus on non-opioid medications, although she has trialed others in past with side effects or minimal benefit.  Hopefully minimizing opioids and working on her ileus will help get her out of the hospital quicker than previously.           Interval: She is overall stable. Her pain is about the same. She is motivated to keep working with therapy to be able to move more. No BM last night. We do not recommend any changes to current regimen.       Recommendations:  -The chronic pain service is a consult service and does not place orders, just makes recommendations (except ketamine and lidocaine infusions)   -Please evaluate all patients on opioids for appropriateness of prescribing narcan at discharge.  The chronic pain service can assist with this.  Nasal narcan is covered by most insurances.  -Recommendations given apply to the current hospitalization and do not reflect long term recommendations.    -discontinued lidocaine infusion at scheduled time.  09/28/22 - 10/01/22  - continue IV tylenol as needed as this was effective for her headaches.  -Agree with GI recs of naloxegol 12.5 mg p.o. daily.  GI following   -Restart home meds whenever able to take PO medications.  Could schedule baclofen and increase pregabalin.  - agree with primary team pain regimen   -recommend continued physical therapy to work on bed level mobility   - would recommend speech therapy consult to work on oral desensitization  as she has not been able to tolerate anyone in her mouth such as ice chips or small sips of water         We will continue to follow.    Naloxone Rx at discharge?  Is patient on opioids? Yes.  1)Is dose >50MME?  Yes.  2) Is patient prescribed a benzodiazepine (w opioids)? Yes.  3)Hx of overdose?  No.  4) Hx of substance use disorder? No.  5) Opioids likely to last greater than a week after discharge? Yes.       If yes to 2 or more, prescribe naloxone at discharge.  Nasal narcan for most insured (Nasal narcan 4mg /actuation, prescribe 1 kit, instructions at [email or web address]).  For uninsured, chronic pain can work to assist in finding an option.  OTC nasal narcan now available at most pharmacies for around $45.    Interim History  There were no Acute Events Overnight.  The patient is not obtaining adequate pain relief on current medication regimen and feels that their pain is Somewhat controlled    Today patient reports pain is the same although mood is a little better after some encouragement. She reports last night they tried to clamp her NG tube and that made her feel horrible.   Last night she used oxycodone 15mg  q4h PRN and IV HM 0.5mg  q6h PRN which has been used around the clock.     Analgesia Evaluation:  Pain at minimum: 5/10  Pain at maximum: 8/10    Current pain medication regimen (including how frequent PRN's were used):  Suboxone 2-0.5mg  TID   Lidocaine infusion 1mg /kg/hr 72 hours   Tylenol IV   Baclofen 10mg  TID on hold   Robaxin IV 250mg  TID   Oxycodone 15mg  q4h PRN (on hold)     Inpatient Medications  Current Facility-Administered Medications   Medication Dose Route Frequency Provider Last Rate Last Admin    [Provider Hold] baclofen (LIORESAL) tablet 10 mg  10 mg Oral TID PRN Rocco Serene, MD   10 mg at 09/27/22 1639    bisacodyl (DULCOLAX) suppository 10 mg  10 mg Rectal Daily PRN [name], MD   10 mg at 10/01/22 2138    buprenorphine-naloxone (SUBOXONE) 2-0.5 mg SL film 0.5 mg of buprenorphine  0.25 Film Sublingual TID Terri Piedra, AGNP   0.5 mg of buprenorphine at 10/04/22 0845    calcium carbonate (TUMS) chewable tablet 400 mg elem calcium  400 mg elem calcium Oral Daily PRN Terri Piedra, AGNP        [Provider Hold] cefdinir (OMNICEF) capsule 300 mg  300 mg Oral Q12H [facility] Rocco Serene, MD        [Provider Hold] doxycycline (VIBRA-TABS) tablet 100 mg  100 mg Oral BID Rocco Serene, MD        famotidine (PF) (PEPCID) injection 20 mg  20 mg Intravenous At bedtime Rocco Serene, MD   20 mg at 10/03/22 2131    fat emulsion 20 % (INTRAlipid) infusion 100 mL  100 mL Intravenous Once PRN Lobonc, Ammie Ferrier, MD        fat emulsion 20 % (INTRAlipid) infusion 200 mL  200 mL Intravenous Once PRN Lobonc, Ammie Ferrier, MD        Parenteral Nutrition (CENTRAL)   Intravenous Continuous [name], MD 45 mL/hr at 10/03/22 2158 New Bag at 10/03/22 2158    And    fat emulsion 20 % with fish oil (SMOFlipid) infusion 250 mL  250 mL Intravenous Continuous [name], MD 20.8 mL/hr at 10/03/22 2158 250 mL at 10/03/22 2158    HYDROmorphone (PF) injection Syrg 0.5 mg  0.5 mg Intravenous Q6H PRN [name], MD   0.5 mg at 10/04/22 0848    LORazepam (ATIVAN) injection 0.5 mg  0.5 mg Intravenous Q6H PRN Rocco Serene, MD   0.5 mg at 10/03/22 2312    [Provider Hold] LORazepam (ATIVAN) tablet 0.5 mg  0.5 mg Oral Q6H PRN Rocco Serene, MD        [Provider Hold] melatonin tablet 3 mg  3 mg Oral Nightly PRN Terri Piedra, AGNP        [Provider Hold] mirtazapine (REMERON) tablet 15 mg  15 mg Oral Nightly Terri Piedra, AGNP   15 mg at 09/26/22 2316    naloxegol (MOVANTIK) 12.5 mg tablet 12.5 mg  12.5 mg Oral Daily [name], MD   12.5 mg at 10/04/22 0845    ondansetron (ZOFRAN) injection 4 mg  4 mg Intravenous Q6H PRN Terri Piedra, AGNP   4 mg at 10/04/22 1610    oxyCODONE (ROXICODONE) immediate release tablet 15 mg  15 mg Oral Q4H PRN [name], MD   15 mg at 09/27/22 1410    pantoprazole (Protonix) injection 40 mg  40 mg Intravenous BID Rocco Serene, MD   40 mg at 10/04/22 0845    phenol (CHLORASEPTIC) 1.4 % spray 2 spray  2 spray Mucous Membrane Q2H PRN Rocco Serene, MD   2 spray at 10/03/22 0138    [Provider Hold] polyethylene glycol (MIRALAX) packet 17 g  17 g Oral Daily PRN Terri Piedra, AGNP        promethazine (PHENERGAN) injection 6.25 mg  6.25 mg Intravenous Q6H PRN Rocco Serene, MD   6.25 mg at 10/04/22 0845    sodium chloride 0.45% (1/2 NS) infusion  100 mL/hr Intravenous Continuous [name], MD 100 mL/hr at 10/03/22 1458 100 mL/hr at 10/03/22 1458         Objective:     Vital Signs    Temp:  [36.8 ??C (98.2 ??F)-36.9 ??C (98.5 ??F)] 36.9 ??C (98.5 ??F)  Heart Rate:  [69-80] 74  SpO2 Pulse:  [72-79] 72  Resp:  [11-18] 14  BP: (96-118)/(42-65) 115/44  MAP (mmHg):  [64-69] 66  SpO2:  [99 %-100 %] 100 %      Physical Exam     GENERAL:  Well developed, well-nourished female and looking uncomfortable from pain and NG tube discomfort.   HEAD/NECK:    Reveals normocephalic/atraumatic.   CARDIOVASCULAR:   Regular rate  LUNGS:   Normal work of breathing, no supplemental 02  EXTREMITIES:  Warm, no clubbing, cyanosis, or edema was noted.  NEUROLOGIC:    The patient was alert and oriented times four with normal language, attention, cognition and memory. Cranial nerve exam was grossly normal.    MUSCULOSKELETAL:    SKIN:  No obvious rashes lesions or erythema  PSY:  Appropriate affect and mood.    Test Results    Lab Results   Component Value Date    CREATININE 0.42 (L) 10/04/2022     Lab Results   Component Value Date    ALKPHOS 69 10/04/2022    BILITOT 0.3 10/04/2022    BILIDIR <0.10 08/08/2022    PROT 5.9 10/04/2022    ALBUMIN 3.1 (L) 10/04/2022    ALT 16 10/04/2022  AST 19 10/04/2022           Problem List    Principal Problem:    Other acute postprocedural pain  Active Problems:    [name]    Intestinal polyps    [name]    Neoplasm related pain    Physical deconditioning    History of colectomy    Intractable abdominal pain    Generalized anxiety disorder with panic attacks    SBO (small bowel obstruction) (CMS-HCC)    Small intestinal bacterial overgrowth (SIBO)      [place], DO   PGY-5 Pain Fellow

## 2022-10-04 NOTE — Unmapped (Addendum)
Daily Progress Note    Assessment/Plan:    Principal Problem:    Other acute postprocedural pain  Active Problems:    [name]    Intestinal polyps    [name]    Neoplasm related pain    Physical deconditioning    History of colectomy    Intractable abdominal pain    Generalized anxiety disorder with panic attacks    SBO (small bowel obstruction) (CMS-HCC)    Small intestinal bacterial overgrowth (SIBO)  Resolved Problems:    * No resolved hospital problems. 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    #. Ileus, multifactorial but probably related to opiates:  #. Inability to tolerate PO, without diagnosis of malnutrition:  - continue TPN for nutritional support  - continue naloxegol  - retry clamping, aim for attempt this evening but depends upon patient's readiness  - continue bowel regimen  - limit opiates, noting however she takes chronically     #. Muscle spasms and diffuse pain, uncertain etiology:  - occurred post-procedurally, with reassuring evaluation (MRI spine, neuro c/s, CK normal)  - IV ativan in lieu of oral  - IV tylenol   - oxycodone for severe pain; limit opiates as able, as above  - IV dilaudid expired today, will aim not to renew  - follow chronic pain recs    #. Coffee-ground emesis:  - noted in NGT 7/21, improved; reason, however, to reassess trial of NG clamping and attempt removal as soon as able, as there may be risk of trauma a/w tube  - continue IV PPI twice daily    Chronic/stable/resolved problems:  #. [name]: hold nirogacestat   #. SIBO: hold home cefdinir, doxy  #. Desmoid tumors: resume nirogacestat as outpatient, follow with GI, VIR for steroid injections     ___________________________________________________________________    Subjective:  - does not feel much different today  - voices frustration with how long this has taken  - attributes ileus entirely to tumors  - says that pain is better in legs  - surprised and upset about how she feels weak this admission compared to prior  - hopeful to mobilize, willing to trial getting out of bed  - not sure of movantik is or isn't helping yet  - no other complaints  - discussed care at length also with her mother, who is at bedside    Recent Results (from the past 24 hour(s))   Comprehensive Metabolic Panel    Collection Time: 10/04/22  3:18 AM   Result Value Ref Range    Sodium 140 135 - 145 mmol/L    Potassium 3.5 3.4 - 4.8 mmol/L    Chloride 108 (H) 98 - 107 mmol/L    CO2 28.0 20.0 - 31.0 mmol/L    Anion Gap 4 (L) 5 - 14 mmol/L    BUN 12 9 - 23 mg/dL    Creatinine 2.95 (L) 0.55 - 1.02 mg/dL    BUN/Creatinine Ratio 29     eGFR CKD-EPI (2021) Female >90 >=60 mL/min/1.42m2    Glucose 105 70 - 179 mg/dL  Calcium 8.8 8.7 - 10.4 mg/dL    Albumin 3.1 (L) 3.4 - 5.0 g/dL    Total Protein 5.9 5.7 - 8.2 g/dL    Total Bilirubin 0.3 0.3 - 1.2 mg/dL    AST 19 <=29 U/L    ALT 16 10 - 49 U/L    Alkaline Phosphatase 69 46 - 116 U/L   CBC    Collection Time: 10/04/22  3:18 AM   Result Value Ref Range    WBC 4.8 3.6 - 11.2 10*9/L    RBC 3.64 (L) 3.95 - 5.13 10*12/L    HGB 9.6 (L) 11.3 - 14.9 g/dL    HCT 56.2 (L) 13.0 - 44.0 %    MCV 80.1 77.6 - 95.7 fL    MCH 26.2 25.9 - 32.4 pg    MCHC 32.7 32.0 - 36.0 g/dL    RDW 86.5 78.4 - 69.6 %    MPV 9.8 6.8 - 10.7 fL    Platelet 182 150 - 450 10*9/L   Magnesium Level    Collection Time: 10/04/22  3:18 AM   Result Value Ref Range    Magnesium 1.9 1.6 - 2.6 mg/dL   Phosphorus Level    Collection Time: 10/04/22  3:18 AM   Result Value Ref Range    Phosphorus 3.7 2.4 - 5.1 mg/dL     Labs/Studies:  Labs and Studies from the last 24hrs per EMR and Reviewed    Objective:  BP 115/44  - Pulse 74  - Temp 36.9 ??C (98.5 ??F) (Oral)  - Resp 14  - Ht 165.1 cm (5' 5)  - Wt 62.1 kg (137 lb)  - SpO2 100%  - BMI 22.80 kg/m??     GEN: tearful, alert, NAD  HEENT: ngt in place, [place] in place  LYMPH: no LAD in head/neck  CV: +s1/s2, nrrr, no m/r/g   PULM: ctab  ABD: +bs, very mildly distended, soft, ttp diffusely without rebound or guarding   EXT: wwp, no edema  NEURO: ma4e, no focal motor deficit  PSYCH: answers questions appropriately

## 2022-10-04 NOTE — Unmapped (Signed)
[facility] Consult Service   Progress Note         Assessment and Recommendations:   [name] [name] is a 22 y.o. 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 The patient is seen in consultation at the request of [name] Levels, MD ([facility])) for concern for coffee ground emesis and ileus.     Ileus, improving following gut-specific opioid antagonist therapy  Ho recurrent ileus and chronic abdominal pain, likely visceral hypersensitivity and opioid induced hyperalgesia. CTAP 7/19 w multiple dilated air and fluid containing bowel loops without a discrete transition point, indicative of ileus, not small bowel obstruction. Historical CT scans reviewed with evidence of chronic small bowel dilation. Abdominal exam subjectively distended with some diminished bowel sounds, otherwise reassuring. We suspect that her ileus is secondary to opioids which she has been receiving for her acute on chronic abdominal pain. Recent pouchoscopy and CT reassuring against structural etiologies of obstruction/SBO. Improving distension on AXR and now w BMs after starting naloxgel. Recommend continued bowel regimen titration and removal of NGT as able (see below for step wise recommendation given patient hesitancy).     - Continue oral naloxegol 12.5 mg daily or every other day while on high dose opioids and not responsive to alternative agents or without oral route for alternative bowel regimen   - Continue rectal suppositories until able to tolerate po bowel regimen  - Minimize medications likely to worsen ileus including anticholinergics and opioids  - Agree with consult to Chronic Pain team   - Agree with discontinuation of antibiotics in absence of acute pouchitis symptoms and normal flex 07/2022, follow up with primary IBDologist ([place]) in place 8/22    Concern for Coffee-Ground Emesis,resolved, likely NGT trauma  1d coffee-ground emesis from NG tube on 7/21. Low concern for alternative sources of an upper GIB (peptic ulcer, AMV, dieulafoy's lesion, etc) as recent EGD 01/2022 was largely unremarkable. Stable chronic IDA, hgb 10.  - Continue BID PPI   - Attempt to remove NG as soon as able to minimize risk of trauma:  ---Recommend clamping trial for 12 hours (eg overnight)  ---Patient to trial CLD, eg broth, to gauge tolerability prior to NGT removal  - Avoid NSAIDS indefinitely    Issues Impacting Complexity of Management:  -None    Recommendations discussed with the patient's primary team. We will sign-off at this time, please re-contact if additional questions or a new need for consultation arises.    Subjective:   2 stools iso naloxegol start, improved AXR    Objective:   Temp:  [36.5 ??C (97.7 ??F)-36.9 ??C (98.5 ??F)] 36.8 ??C (98.2 ??F)  Heart Rate:  [71-100] 80  SpO2 Pulse:  [70-101] 79  Resp:  [10-18] 18  BP: (105-126)/(42-64) 118/42  SpO2:  [99 %] 99 %    GEN: NAD  HEENT: NGT in place, clamped having been so following medication admin  ABD: diffusely tender  MSK: No edema, able to lift legs against gravity    Pertinent studies:  No leukocytosis, chronic stable IDA, low normal platelets.  Low ferritin, VItD  Normal electrolytes, LFTs, INR. Mildly hypoalbuminemic.

## 2022-10-05 LAB — COMPREHENSIVE METABOLIC PANEL
ALBUMIN: 3.1 g/dL — ABNORMAL LOW (ref 3.4–5.0)
ALKALINE PHOSPHATASE: 72 U/L (ref 46–116)
ALT (SGPT): 20 U/L (ref 10–49)
ANION GAP: 3 mmol/L — ABNORMAL LOW (ref 5–14)
AST (SGOT): 20 U/L (ref <=34)
BILIRUBIN TOTAL: 0.2 mg/dL — ABNORMAL LOW (ref 0.3–1.2)
BLOOD UREA NITROGEN: 12 mg/dL (ref 9–23)
BUN / CREAT RATIO: 27
CALCIUM: 8.9 mg/dL (ref 8.7–10.4)
CHLORIDE: 109 mmol/L — ABNORMAL HIGH (ref 98–107)
CO2: 29 mmol/L (ref 20.0–31.0)
CREATININE: 0.44 mg/dL — ABNORMAL LOW
EGFR CKD-EPI (2021) FEMALE: >90 mL/min/{1.73_m2} (ref >=60)
GLUCOSE RANDOM: 104 mg/dL (ref 70–179)
POTASSIUM: 3.7 mmol/L (ref 3.4–4.8)
PROTEIN TOTAL: 5.8 g/dL (ref 5.7–8.2)
SODIUM: 141 mmol/L (ref 135–145)

## 2022-10-05 LAB — MAGNESIUM: MAGNESIUM: 2 mg/dL (ref 1.6–2.6)

## 2022-10-05 LAB — CBC
HEMATOCRIT: 29.5 % — ABNORMAL LOW (ref 34.0–44.0)
HEMOGLOBIN: 9.7 g/dL — ABNORMAL LOW (ref 11.3–14.9)
MEAN CORPUSCULAR HEMOGLOBIN CONC: 33 g/dL (ref 32.0–36.0)
MEAN CORPUSCULAR HEMOGLOBIN: 26.4 pg (ref 25.9–32.4)
MEAN CORPUSCULAR VOLUME: 80.1 fL (ref 77.6–95.7)
MEAN PLATELET VOLUME: 10.3 fL (ref 6.8–10.7)
PLATELET COUNT: 178 10*9/L (ref 150–450)
RED BLOOD CELL COUNT: 3.68 10*12/L — ABNORMAL LOW (ref 3.95–5.13)
RED CELL DISTRIBUTION WIDTH: 13 % (ref 12.2–15.2)
WBC ADJUSTED: 4.9 10*9/L (ref 3.6–11.2)

## 2022-10-05 LAB — PHOSPHORUS: PHOSPHORUS: 4 mg/dL (ref 2.4–5.1)

## 2022-10-05 MED ADMIN — pantoprazole (Protonix) injection 40 mg: 40 mg | INTRAVENOUS | @ 01:00:00 | NDC 82009001190

## 2022-10-05 MED ADMIN — LORazepam (ATIVAN) injection 0.5 mg: .5 mg | INTRAVENOUS | @ 14:00:00 | NDC 76329826101

## 2022-10-05 MED ADMIN — promethazine (PHENERGAN) injection 6.25 mg: 6.25 mg | INTRAVENOUS | @ 04:00:00 | NDC 60977000143

## 2022-10-05 MED ADMIN — promethazine (PHENERGAN) injection 6.25 mg: 6.25 mg | INTRAVENOUS | @ 10:00:00 | NDC 60977000143

## 2022-10-05 MED ADMIN — acetaminophen (OFIRMEV) 10 mg/mL injection 1,000 mg: 1000 mg | INTRAVENOUS | @ 16:00:00 | Stop: 2022-10-05 | NDC 68094059462

## 2022-10-05 MED ADMIN — promethazine (PHENERGAN) injection 6.25 mg: 6.25 mg | INTRAVENOUS | @ 17:00:00 | NDC 60977000143

## 2022-10-05 MED ADMIN — magnesium sulfate 2gm/50mL IVPB: 2 g | INTRAVENOUS | @ 19:00:00 | Stop: 2022-10-05

## 2022-10-05 MED ADMIN — lactated ringers bolus 1,000 mL: 1000 mL | INTRAVENOUS | @ 20:00:00 | Stop: 2022-10-05 | NDC 53191040901

## 2022-10-05 MED ADMIN — pantoprazole (Protonix) injection 40 mg: 40 mg | INTRAVENOUS | @ 13:00:00 | NDC 82009001190

## 2022-10-05 MED ADMIN — fat emulsion 20 % with fish oil (SMOFlipid) infusion 250 mL: 250 mL | INTRAVENOUS | @ 01:00:00 | Stop: 2022-10-05 | NDC 63323082074

## 2022-10-05 MED ADMIN — ondansetron (ZOFRAN) injection 4 mg: 4 mg | INTRAVENOUS | @ 22:00:00 | NDC 71930001752

## 2022-10-05 MED ADMIN — buprenorphine-naloxone (SUBOXONE) 2-0.5 mg SL film 0.5 mg of buprenorphine: .25 | SUBLINGUAL | @ 19:00:00 | NDC 52427069211

## 2022-10-05 MED ADMIN — naloxegol (MOVANTIK) 12.5 mg tablet 12.5 mg: 12.5 mg | ORAL | @ 13:00:00 | NDC 82625880101

## 2022-10-05 MED ADMIN — Parenteral Nutrition (CENTRAL): INTRAVENOUS | @ 01:00:00 | Stop: 2022-10-05 | NDC 96295013582

## 2022-10-05 MED ADMIN — acetaminophen (OFIRMEV) 10 mg/mL injection 1,000 mg: 1000 mg | INTRAVENOUS | @ 08:00:00 | Stop: 2022-10-05 | NDC 68094059462

## 2022-10-05 MED ADMIN — buprenorphine-naloxone (SUBOXONE) 2-0.5 mg SL film 0.5 mg of buprenorphine: .25 | SUBLINGUAL | @ 13:00:00 | NDC 52427069211

## 2022-10-05 MED ADMIN — oxyCODONE (ROXICODONE) immediate release tablet 15 mg: 15 mg | ORAL | @ 22:00:00 | Stop: 2022-10-09 | NDC 72865012805

## 2022-10-05 MED ADMIN — ondansetron (ZOFRAN) injection 4 mg: 4 mg | INTRAVENOUS | @ 08:00:00 | NDC 71930001752

## 2022-10-05 MED ADMIN — sodium chloride 0.45% (1/2 NS) infusion: 100 mL/h | INTRAVENOUS | @ 08:00:00 | NDC 68258901501

## 2022-10-05 MED ADMIN — oxyCODONE (ROXICODONE) immediate release tablet 15 mg: 15 mg | ORAL | @ 17:00:00 | Stop: 2022-10-09 | NDC 72865012805

## 2022-10-05 MED ADMIN — lactated ringers bolus 1,000 mL: 1000 mL | INTRAVENOUS | @ 15:00:00 | Stop: 2022-10-05 | NDC 53191040901

## 2022-10-05 MED ADMIN — famotidine (PF) (PEPCID) injection 20 mg: 20 mg | INTRAVENOUS | @ 01:00:00 | NDC 96619043921

## 2022-10-05 MED ADMIN — ondansetron (ZOFRAN) injection 4 mg: 4 mg | INTRAVENOUS | @ 14:00:00 | NDC 71930001752

## 2022-10-05 MED ADMIN — buprenorphine-naloxone (SUBOXONE) 2-0.5 mg SL film 0.5 mg of buprenorphine: .25 | SUBLINGUAL | @ 01:00:00 | NDC 52427069211

## 2022-10-05 MED ADMIN — acetaminophen (OFIRMEV) 10 mg/mL injection 1,000 mg: 1000 mg | INTRAVENOUS | @ 01:00:00 | Stop: 2022-10-05 | NDC 68094059462

## 2022-10-05 MED ADMIN — oxyCODONE (ROXICODONE) immediate release tablet 15 mg: 15 mg | ORAL | @ 05:00:00 | Stop: 2022-10-09 | NDC 72865012805

## 2022-10-05 NOTE — Unmapped (Addendum)
Daily Progress Note    Assessment/Plan:    Principal Problem:    Other acute postprocedural pain  Active Problems:    [name]    Intestinal polyps    [name]    Neoplasm related pain    Physical deconditioning    History of colectomy    Intractable abdominal pain    Generalized anxiety disorder with panic attacks    SBO (small bowel obstruction) (CMS-HCC)    Small intestinal bacterial overgrowth (SIBO)  Resolved Problems:    * No resolved hospital problems. 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    #. Ileus, multifactorial but probably related to opiates:  #. Inability to tolerate PO, without diagnosis of malnutrition:  - continue TPN for nutritional support  - continue naloxegol  - retry clamping, aim for attempt this evening but depends upon patient's readiness  - continue bowel regimen  - limit opiates, noting however she takes chronically     #. Headache, now daily (chronic?):  - trial of IVF, magnesium  - could trial benadryl but may be less helpful without traditional cocktail with compazine  - patient not interested in trying steroids  - consider other meds if ongoing     #. Muscle spasms and diffuse pain, uncertain etiology:  - occurred post-procedurally, with reassuring evaluation (MRI spine, neuro c/s, CK normal)  - IV ativan in lieu of oral  - IV tylenol   - oxycodone for severe pain; limit opiates as able, as above  - IV dilaudid expired today, will aim not to renew  - follow chronic pain recs    #. Coffee-ground emesis:  - noted in NGT 7/21, improved; reason, however, to reassess trial of NG clamping and attempt removal as soon as able, as there may be risk of trauma a/w tube  - continue IV PPI twice daily    Chronic/stable/resolved problems:  #. [name]: hold nirogacestat   #. SIBO: hold home cefdinir, doxy  #. Desmoid tumors: resume nirogacestat as outpatient, follow with GI, VIR for steroid injections     ___________________________________________________________________    Subjective:  - felt dizzy  - had low BP overnight  - BP may have been improved with IVF  - orthostatics negative today, although wide pulse pressure that varies with position, it appears  - ongoing headache, has had it since even before admission, and before APAP use  - some relief here from APAP  - denies history of migraines  - not sure what else might help, would not wish to take compazine, would not want oral mag, might take benadryl (although not in combination with compazine)  - feels her UOP has been a lot  - NG tube periodically clamped for meds, otherwise output has slowed during day shift  - more pain overnight, although worse when oral options held because of low BP      Recent Results (from the past 24 hour(s))   Comprehensive Metabolic Panel    Collection Time: 10/05/22  6:11 AM   Result Value Ref Range    Sodium 141 135 - 145  mmol/L    Potassium 3.7 3.4 - 4.8 mmol/L    Chloride 109 (H) 98 - 107 mmol/L    CO2 29.0 20.0 - 31.0 mmol/L    Anion Gap 3 (L) 5 - 14 mmol/L    BUN 12 9 - 23 mg/dL    Creatinine 3.66 (L) 0.55 - 1.02 mg/dL    BUN/Creatinine Ratio 27     eGFR CKD-EPI (2021) Female >90 >=60 mL/min/1.21m2    Glucose 104 70 - 179 mg/dL    Calcium 8.9 8.7 - 44.0 mg/dL    Albumin 3.1 (L) 3.4 - 5.0 g/dL    Total Protein 5.8 5.7 - 8.2 g/dL    Total Bilirubin 0.2 (L) 0.3 - 1.2 mg/dL    AST 20 <=34 U/L    ALT 20 10 - 49 U/L    Alkaline Phosphatase 72 46 - 116 U/L   CBC    Collection Time: 10/05/22  6:11 AM   Result Value Ref Range    WBC 4.9 3.6 - 11.2 10*9/L    RBC 3.68 (L) 3.95 - 5.13 10*12/L    HGB 9.7 (L) 11.3 - 14.9 g/dL    HCT 74.2 (L) 59.5 - 44.0 %    MCV 80.1 77.6 - 95.7 fL    MCH 26.4 25.9 - 32.4 pg    MCHC 33.0 32.0 - 36.0 g/dL    RDW 63.8 75.6 - 43.3 %    MPV 10.3 6.8 - 10.7 fL    Platelet 178 150 - 450 10*9/L   Magnesium Level    Collection Time: 10/05/22  6:11 AM   Result Value Ref Range    Magnesium 2.0 1.6 - 2.6 mg/dL   Phosphorus Level    Collection Time: 10/05/22  6:11 AM   Result Value Ref Range    Phosphorus 4.0 2.4 - 5.1 mg/dL     Labs/Studies:  Labs and Studies from the last 24hrs per EMR and Reviewed    Objective:  BP 98/48  - Pulse 64  - Temp 36.5 ??C (97.7 ??F) (Oral)  - Resp 14  - Ht 165.1 cm (5' 5)  - Wt 62.1 kg (137 lb)  - SpO2 100%  - BMI 22.80 kg/m??     GEN: appears calmer, alert, NAD  HEENT: ngt in place, [place] in place  LYMPH: no LAD in head/neck  CV: +s1/s2, nrrr, no m/r/g   PULM: ctab  ABD: +bs, very mildly distended (if at all), soft, ttp diffusely without rebound or guarding   EXT: wwp, no edema  NEURO: ma4e, no focal motor deficit  PSYCH: answers questions appropriately

## 2022-10-05 NOTE — Unmapped (Signed)
Pt alert and oriented throughout shift but feeling very fatigued. Remained on 2L . Ambulating to bathroom multiple times with more than adequate urine output. BP soft throughout shift and provider made aware. No orders received but just to monitor. Multiple PRN medications given, see MAR. Still remaninig nauseous and unable to take anything PO. Trial clamping of NG tube but unable to maintain for more than 4-5 hr at a time.

## 2022-10-06 LAB — BASIC METABOLIC PANEL
ANION GAP: 6 mmol/L (ref 5–14)
ANION GAP: 2 mmol/L — ABNORMAL LOW (ref 5–14)
BLOOD UREA NITROGEN: 13 mg/dL (ref 9–23)
BLOOD UREA NITROGEN: 14 mg/dL (ref 9–23)
BUN / CREAT RATIO: 27
BUN / CREAT RATIO: 29
CALCIUM: 8.4 mg/dL — ABNORMAL LOW (ref 8.7–10.4)
CALCIUM: 8.8 mg/dL (ref 8.7–10.4)
CHLORIDE: 105 mmol/L (ref 98–107)
CHLORIDE: 108 mmol/L — ABNORMAL HIGH (ref 98–107)
CO2: 27 mmol/L (ref 20.0–31.0)
CO2: 30 mmol/L (ref 20.0–31.0)
CREATININE: 0.48 mg/dL — ABNORMAL LOW
CREATININE: 0.48 mg/dL — ABNORMAL LOW
EGFR CKD-EPI (2021) FEMALE: >90 mL/min/{1.73_m2} (ref >=60)
EGFR CKD-EPI (2021) FEMALE: >90 mL/min/{1.73_m2} (ref >=60)
GLUCOSE RANDOM: 559 mg/dL (ref 70–179)
GLUCOSE RANDOM: 107 mg/dL (ref 70–179)
POTASSIUM: 6.6 mmol/L (ref 3.4–4.8)
POTASSIUM: 4 mmol/L (ref 3.4–4.8)
SODIUM: 138 mmol/L (ref 135–145)
SODIUM: 140 mmol/L (ref 135–145)

## 2022-10-06 LAB — MAGNESIUM
MAGNESIUM: 2.6 mg/dL (ref 1.6–2.6)
MAGNESIUM: 2.1 mg/dL (ref 1.6–2.6)

## 2022-10-06 LAB — PHOSPHORUS
PHOSPHORUS: 6.5 mg/dL — ABNORMAL HIGH (ref 2.4–5.1)
PHOSPHORUS: 4.5 mg/dL (ref 2.4–5.1)

## 2022-10-06 MED ADMIN — buprenorphine-naloxone (SUBOXONE) 2-0.5 mg SL film 0.5 mg of buprenorphine: .25 | SUBLINGUAL | @ 01:00:00 | NDC 52427069211

## 2022-10-06 MED ADMIN — LORazepam (ATIVAN) injection 0.5 mg: .5 mg | INTRAVENOUS | @ 08:00:00 | Stop: 2022-10-06 | NDC 76329826101

## 2022-10-06 MED ADMIN — LORazepam (ATIVAN) injection 0.5 mg: .5 mg | INTRAVENOUS | @ 21:00:00 | NDC 76329826101

## 2022-10-06 MED ADMIN — promethazine (PHENERGAN) injection 6.25 mg: 6.25 mg | INTRAVENOUS | @ 08:00:00 | NDC 60977000143

## 2022-10-06 MED ADMIN — pantoprazole (Protonix) injection 40 mg: 40 mg | INTRAVENOUS | @ 01:00:00 | NDC 82009001190

## 2022-10-06 MED ADMIN — sodium chloride 0.45% (1/2 NS) infusion: 100 mL/h | INTRAVENOUS | @ 03:00:00 | NDC 68258901501

## 2022-10-06 MED ADMIN — acetaminophen (OFIRMEV) 10 mg/mL injection 650 mg 65 mL: 650 mg | INTRAVENOUS | @ 04:00:00 | Stop: 2022-10-05 | NDC 70004000156

## 2022-10-06 MED ADMIN — sodium chloride 0.45% (1/2 NS) infusion: 100 mL/h | INTRAVENOUS | @ 12:00:00 | NDC 68258901501

## 2022-10-06 MED ADMIN — Parenteral Nutrition (CENTRAL): INTRAVENOUS | @ 03:00:00 | Stop: 2022-10-06 | NDC 96295013582

## 2022-10-06 MED ADMIN — acetaminophen (OFIRMEV) 10 mg/mL injection 1,000 mg: 1000 mg | INTRAVENOUS | @ 16:00:00 | Stop: 2022-10-07 | NDC 68094059462

## 2022-10-06 MED ADMIN — promethazine (PHENERGAN) injection 6.25 mg: 6.25 mg | INTRAVENOUS | @ 13:00:00 | NDC 60977000143

## 2022-10-06 MED ADMIN — fat emulsion 20 % with fish oil (SMOFlipid) infusion 250 mL: 250 mL | INTRAVENOUS | @ 03:00:00 | Stop: 2022-10-06 | NDC 63323082074

## 2022-10-06 MED ADMIN — buprenorphine-naloxone (SUBOXONE) 2-0.5 mg SL film 0.5 mg of buprenorphine: .25 | SUBLINGUAL | @ 19:00:00 | NDC 52427069211

## 2022-10-06 MED ADMIN — promethazine (PHENERGAN) injection 6.25 mg: 6.25 mg | INTRAVENOUS | @ 01:00:00 | NDC 60977000143

## 2022-10-06 MED ADMIN — famotidine (PF) (PEPCID) injection 20 mg: 20 mg | INTRAVENOUS | @ 01:00:00 | NDC 96619043921

## 2022-10-06 MED ADMIN — oxyCODONE (ROXICODONE) immediate release tablet 15 mg: 15 mg | ORAL | Stop: 2022-10-09 | NDC 72865012805

## 2022-10-06 MED ADMIN — ondansetron (ZOFRAN) injection 4 mg: 4 mg | INTRAVENOUS | NDC 71930001752

## 2022-10-06 MED ADMIN — oxyCODONE (ROXICODONE) immediate release tablet 15 mg: 15 mg | ORAL | @ 19:00:00 | Stop: 2022-10-09 | NDC 72865012805

## 2022-10-06 MED ADMIN — buprenorphine-naloxone (SUBOXONE) 2-0.5 mg SL film 0.5 mg of buprenorphine: .25 | SUBLINGUAL | @ 12:00:00 | NDC 52427069211

## 2022-10-06 MED ADMIN — ondansetron (ZOFRAN) injection 4 mg: 4 mg | INTRAVENOUS | @ 16:00:00 | NDC 71930001752

## 2022-10-06 MED ADMIN — naloxegol (MOVANTIK) 12.5 mg tablet 12.5 mg: 12.5 mg | ORAL | @ 12:00:00 | NDC 82625880101

## 2022-10-06 MED ADMIN — diphenhydrAMINE (BENADRYL) injection: 25 mg | INTRAVENOUS | @ 13:00:00 | Stop: 2022-10-06 | NDC 57866728801

## 2022-10-06 MED ADMIN — promethazine (PHENERGAN) injection 6.25 mg: 6.25 mg | INTRAVENOUS | @ 21:00:00 | NDC 60977000143

## 2022-10-06 MED ADMIN — diphenhydrAMINE (BENADRYL) injection: 25 mg | INTRAVENOUS | @ 04:00:00 | Stop: 2022-10-05 | NDC 57866728801

## 2022-10-06 MED ADMIN — LORazepam (ATIVAN) injection 0.5 mg: .5 mg | INTRAVENOUS | @ 01:00:00 | NDC 76329826101

## 2022-10-06 MED ADMIN — pantoprazole (Protonix) injection 40 mg: 40 mg | INTRAVENOUS | @ 12:00:00 | NDC 82009001190

## 2022-10-06 MED ADMIN — oxyCODONE (ROXICODONE) immediate release tablet 15 mg: 15 mg | ORAL | @ 13:00:00 | Stop: 2022-10-09 | NDC 72865012805

## 2022-10-06 MED ADMIN — sodium chloride 0.45% (1/2 NS) infusion: 100 mL/h | INTRAVENOUS | @ 21:00:00 | NDC 68258901501

## 2022-10-06 NOTE — Unmapped (Signed)
Daily Progress Note    Assessment/Plan:    Principal Problem:    Other acute postprocedural pain  Active Problems:    [name]    Intestinal polyps    [name]    Neoplasm related pain    Physical deconditioning    History of colectomy    Intractable abdominal pain    Generalized anxiety disorder with panic attacks    SBO (small bowel obstruction) (CMS-HCC)    Small intestinal bacterial overgrowth (SIBO)  Resolved Problems:    * No resolved hospital problems. 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    #. Ileus, multifactorial but probably related to opiates:  #. Inability to tolerate PO, without diagnosis of malnutrition:  - continue TPN for nutritional support  - continue naloxegol  - retry clamping, aim for attempt as soon as can be tried again but in discussion with patient  - continue bowel regimen  - limit opiates, noting however she takes chronically     #. Headache, now daily (chronic?):  - trial of IVF, magnesium --> not much different with this 7/28  - can trial benadryl but may be less helpful without traditional cocktail with compazine  - patient not interested in trying steroids  - consider other meds if ongoing, although interactions and prior exposures may limit options in discussion with patient      #. Muscle spasms and diffuse pain, uncertain etiology:  - occurred post-procedurally, with reassuring evaluation (MRI spine, neuro c/s, CK normal)  - IV ativan in lieu of oral --> wean beginning 7/29 (worried about adverse effects like HoTN, note improvement in muscle pain thus far)  - IV tylenol --> could query also some cyclical/rebound headache with chronic APAP, but would not wean at this time in discussion with patient  - oxycodone for severe pain; limit opiates as able, as above  - follow chronic pain recs    #. Coffee-ground emesis:  - noted in NGT 7/21, improved; reason, however, to reassess trial of NG clamping and attempt removal as soon as able, as there may be risk of trauma a/w tube  - continue IV PPI twice daily    Chronic/stable/resolved problems:  #. [name]: hold nirogacestat   #. SIBO: hold home cefdinir, doxy  #. Desmoid tumors: resume nirogacestat as outpatient, follow with GI, VIR for steroid injections     ___________________________________________________________________    Subjective:  - ongoing headache, some relief from benadryl  - still feels very weak  - ok with adjusting down ativan  - pain and spasms has remained improved  - feels dizzy at times  - worried about BP being low at times  - denies fevers, chills  - per RN, appears ok to stand for standing weights, will obtain  - has ambulated to/from bathroom      No results found for this or any previous visit (from the past 24 hour(s)).    Labs/Studies:  Labs and Studies from the last 24hrs per EMR and Reviewed    Objective:  BP 106/49  - Pulse 72  - Temp 36.4 ??  C (97.5 ??F) (Oral)  - Resp 12  - Ht 165.1 cm (5' 5)  - Wt 62.1 kg (137 lb)  - SpO2 100%  - BMI 22.80 kg/m??     GEN: alert, NAD  HEENT: ngt in place, [place] in place; more nbnb output today from ng  CV: +s1/s2, nrrr, no m/r/g   PULM: ctab  ABD: +bs, appears less tender today  EXT: wwp, no edema  NEURO: ma4e, no focal motor deficit  PSYCH: answers questions appropriately

## 2022-10-07 LAB — BASIC METABOLIC PANEL
ANION GAP: 9 mmol/L (ref 5–14)
ANION GAP: 3 mmol/L — ABNORMAL LOW (ref 5–14)
BLOOD UREA NITROGEN: 13 mg/dL (ref 9–23)
BLOOD UREA NITROGEN: 13 mg/dL (ref 9–23)
BUN / CREAT RATIO: 39
BUN / CREAT RATIO: 28
CALCIUM: 8.2 mg/dL — ABNORMAL LOW (ref 8.7–10.4)
CALCIUM: 9.1 mg/dL (ref 8.7–10.4)
CHLORIDE: 102 mmol/L (ref 98–107)
CHLORIDE: 108 mmol/L — ABNORMAL HIGH (ref 98–107)
CO2: 26 mmol/L (ref 20.0–31.0)
CO2: 30 mmol/L (ref 20.0–31.0)
CREATININE: 0.33 mg/dL — ABNORMAL LOW
CREATININE: 0.46 mg/dL — ABNORMAL LOW
EGFR CKD-EPI (2021) FEMALE: >90 mL/min/{1.73_m2} (ref >=60)
EGFR CKD-EPI (2021) FEMALE: >90 mL/min/{1.73_m2} (ref >=60)
GLUCOSE RANDOM: 581 mg/dL (ref 70–179)
GLUCOSE RANDOM: 93 mg/dL (ref 70–179)
POTASSIUM: 6.4 mmol/L (ref 3.4–4.8)
POTASSIUM: 4 mmol/L (ref 3.4–4.8)
SODIUM: 137 mmol/L (ref 135–145)
SODIUM: 141 mmol/L (ref 135–145)

## 2022-10-07 LAB — CBC W/ AUTO DIFF
BASOPHILS ABSOLUTE COUNT: 0 10*9/L (ref 0.0–0.1)
BASOPHILS RELATIVE PERCENT: 0.6 %
EOSINOPHILS ABSOLUTE COUNT: 0.3 10*9/L (ref 0.0–0.5)
EOSINOPHILS RELATIVE PERCENT: 6.9 %
HEMATOCRIT: 30.2 % — ABNORMAL LOW (ref 34.0–44.0)
HEMOGLOBIN: 10.1 g/dL — ABNORMAL LOW (ref 11.3–14.9)
LYMPHOCYTES ABSOLUTE COUNT: 1.6 10*9/L (ref 1.1–3.6)
LYMPHOCYTES RELATIVE PERCENT: 35 %
MEAN CORPUSCULAR HEMOGLOBIN CONC: 33.3 g/dL (ref 32.0–36.0)
MEAN CORPUSCULAR HEMOGLOBIN: 26.5 pg (ref 25.9–32.4)
MEAN CORPUSCULAR VOLUME: 79.5 fL (ref 77.6–95.7)
MEAN PLATELET VOLUME: 10.6 fL (ref 6.8–10.7)
MONOCYTES ABSOLUTE COUNT: 0.7 10*9/L (ref 0.3–0.8)
MONOCYTES RELATIVE PERCENT: 14.2 %
NEUTROPHILS ABSOLUTE COUNT: 2 10*9/L (ref 1.8–7.8)
NEUTROPHILS RELATIVE PERCENT: 43.3 %
PLATELET COUNT: 179 10*9/L (ref 150–450)
RED BLOOD CELL COUNT: 3.8 10*12/L — ABNORMAL LOW (ref 3.95–5.13)
RED CELL DISTRIBUTION WIDTH: 13.1 % (ref 12.2–15.2)
WBC ADJUSTED: 4.7 10*9/L (ref 3.6–11.2)

## 2022-10-07 LAB — PHOSPHORUS
PHOSPHORUS: 5.5 mg/dL — ABNORMAL HIGH (ref 2.4–5.1)
PHOSPHORUS: 4.8 mg/dL (ref 2.4–5.1)

## 2022-10-07 LAB — MAGNESIUM
MAGNESIUM: 2.5 mg/dL (ref 1.6–2.6)
MAGNESIUM: 2.1 mg/dL (ref 1.6–2.6)

## 2022-10-07 MED ADMIN — promethazine (PHENERGAN) injection 6.25 mg: 6.25 mg | INTRAVENOUS | NDC 60977000143

## 2022-10-07 MED ADMIN — buprenorphine-naloxone (SUBOXONE) 2-0.5 mg SL film 0.5 mg of buprenorphine: .25 | SUBLINGUAL | NDC 52427069211

## 2022-10-07 MED ADMIN — promethazine (PHENERGAN) injection 6.25 mg: 6.25 mg | INTRAVENOUS | @ 10:00:00 | NDC 60977000143

## 2022-10-07 MED ADMIN — acetaminophen (OFIRMEV) 10 mg/mL injection 1,000 mg: 1000 mg | INTRAVENOUS | @ 10:00:00 | Stop: 2022-10-07 | NDC 68094059462

## 2022-10-07 MED ADMIN — LORazepam (ATIVAN) injection 0.5 mg: .5 mg | INTRAVENOUS | @ 07:00:00 | Stop: 2022-10-07 | NDC 76329826101

## 2022-10-07 MED ADMIN — ondansetron (ZOFRAN) injection 4 mg: 4 mg | INTRAVENOUS | @ 20:00:00 | NDC 71930001752

## 2022-10-07 MED ADMIN — pantoprazole (Protonix) injection 40 mg: 40 mg | INTRAVENOUS | NDC 82009001190

## 2022-10-07 MED ADMIN — fat emulsion 20 % with fish oil (SMOFlipid) infusion 250 mL: 250 mL | INTRAVENOUS | @ 03:00:00 | Stop: 2022-10-07 | NDC 63323082074

## 2022-10-07 MED ADMIN — naloxegol (MOVANTIK) 12.5 mg tablet 12.5 mg: 12.5 mg | ORAL | @ 12:00:00 | Stop: 2022-10-07 | NDC 82625880101

## 2022-10-07 MED ADMIN — oxyCODONE (ROXICODONE) immediate release tablet 15 mg: 15 mg | ORAL | @ 16:00:00 | Stop: 2022-10-09 | NDC 72865012805

## 2022-10-07 MED ADMIN — bisacodyl (DULCOLAX) suppository 10 mg: 10 mg | RECTAL | @ 21:00:00 | NDC 96295012646

## 2022-10-07 MED ADMIN — diphenhydrAMINE (BENADRYL) injection: 25 mg | INTRAVENOUS | @ 01:00:00 | Stop: 2022-10-06 | NDC 57866728801

## 2022-10-07 MED ADMIN — buprenorphine-naloxone (SUBOXONE) 2-0.5 mg SL film 0.5 mg of buprenorphine: .25 | SUBLINGUAL | @ 20:00:00 | NDC 52427069211

## 2022-10-07 MED ADMIN — acetaminophen (OFIRMEV) 10 mg/mL injection 1,000 mg: 1000 mg | INTRAVENOUS | @ 02:00:00 | Stop: 2022-10-07 | NDC 68094059462

## 2022-10-07 MED ADMIN — oxyCODONE (ROXICODONE) immediate release tablet 15 mg: 15 mg | ORAL | @ 20:00:00 | Stop: 2022-10-09 | NDC 72865012805

## 2022-10-07 MED ADMIN — pantoprazole (Protonix) injection 40 mg: 40 mg | INTRAVENOUS | @ 12:00:00 | NDC 82009001190

## 2022-10-07 MED ADMIN — promethazine (PHENERGAN) injection 6.25 mg: 6.25 mg | INTRAVENOUS | @ 02:00:00 | NDC 60977000143

## 2022-10-07 MED ADMIN — oxyCODONE (ROXICODONE) immediate release tablet 15 mg: 15 mg | ORAL | @ 11:00:00 | Stop: 2022-10-09 | NDC 72865012805

## 2022-10-07 MED ADMIN — promethazine (PHENERGAN) injection 6.25 mg: 6.25 mg | INTRAVENOUS | @ 16:00:00 | NDC 60977000143

## 2022-10-07 MED ADMIN — famotidine (PF) (PEPCID) injection 20 mg: 20 mg | INTRAVENOUS | NDC 96619043921

## 2022-10-07 MED ADMIN — LORazepam (ATIVAN) tablet 0.5 mg: .5 mg | ORAL | @ 18:00:00 | NDC 76420064790

## 2022-10-07 MED ADMIN — sodium chloride 0.45% (1/2 NS) infusion: 100 mL/h | INTRAVENOUS | @ 07:00:00 | NDC 68258901501

## 2022-10-07 MED ADMIN — sodium chloride 0.45% (1/2 NS) infusion: 100 mL/h | INTRAVENOUS | @ 17:00:00 | NDC 68258901501

## 2022-10-07 MED ADMIN — buprenorphine-naloxone (SUBOXONE) 2-0.5 mg SL film 0.5 mg of buprenorphine: .25 | SUBLINGUAL | @ 12:00:00 | NDC 52427069211

## 2022-10-07 MED ADMIN — Parenteral Nutrition (CENTRAL): INTRAVENOUS | @ 03:00:00 | Stop: 2022-10-07 | NDC 96295013582

## 2022-10-07 NOTE — Unmapped (Signed)
Department of Anesthesiology  Pain Medicine Division    Chronic Pain Followup Inpatient Consult Note    Requesting Attending Physician:  Lambert Mody, MD PhD  Service Requesting Consult:  [facility] [place] [facility])    Assessment/Recommendations:    The patient was seen in consultation on request of Rocco Serene, MD regarding assistance with pain management for patient's chronic intractable abdominal pain with now acute on chronic recurrent ileus/NG tube placement.  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      Patient currently established with [facility] pain management center Dr. Dyann Ruddle, has been on oxycodone 10 mg 3 times daily, Suboxone 0.5 mg 3 times daily along with baclofen and pregabalin. Overall goal is to wean to buprenorphine and non-opioid medications and eventually off of buprenorphine as well. Through her psychiatrist she is on Ativan 0.5 mg 3 times daily as needed. She has contracted to avoid taking the ativan at the same time or close to when she takes her opioid pain medications.     Patient has had multiple prior inpatient stays either regarding acute exacerbation of chronic pain or ileus.  In the past 12 months she has had 2 infusions of ketamine with limited benefit (last infusion in May 2024) and at one point there was some transaminitis which was thought to be attributed from other etiologies like TPN versus other intra-abdominal pathologies.  In the past patient also has had lidocaine infusions during inpatient stay with limited/variable benefit.        Regarding ileus and her constipation, After discussion with the patient and mother we came to an agreement to attempt with a low-dose of naloxegol 12.5 mg, she has taken enterag around surgery time before and seemed to tolerate it. GI team recommended against neostigmine at this time. We expect the naloxegol to increase her abdominal pain at first but hope that we can continue it and even increase to 25mg  if able.     This is no doubt a very complex situation. Her biggest issue at this point seems to be the ileus. Medication management should focus on non-opioid medications, although she has trialed others in past with side effects or minimal benefit.  Hopefully minimizing opioids and working on her ileus will help get her out of the hospital quicker than previously.  Our goal while admitted would be for her to be able to manage her pain without the need for IV opioids and instead take oxycodone up to 15 mg 4 times daily as needed, with the goal of trying to get as close to oxycodone 10 mg 3 times daily as needed as possible.    Her Suboxone is currently increased from her home dose in an attempt to provide optimal basal control of her pain, favoring an increase in buprenorphine which is a partial mu opioid agonist over use of full mu opioid agonists, which can have worse GI side effect profiles.  Again, ultimately the goal would be to wean off of oxy and manage her pain with buprenorphine alone.       Interval: She notes that she has not required IV hydromorphone in the last 2 days and states that her pain is largely unchanged but her overall health status and ileus seems to likewise be unchanged.  She notes frustration with this lack of benefit despite working hard to stop IV opioids.  I encouraged her  that staying off of IV opioids would help prevent narcotic bowel syndrome that has caused her admissions to be prolonged in the past.  We discussed the need to try to minimize medications overall noting that the lower her opioids are the faster her ileus is likely to resolve.  I expressed my encouragement relative to her ability to tolerate naloxegol and agree with the primary team's plan for clamping trials and bowel regimen management.  Ultimately, given her negative spine MRIs from earlier in the admission her current symptomatology regarding her weakness and difficulty ambulating may be more related to malnutrition versus medication side effect.  Specifically, the patient has been on baclofen as an outpatient and baclofen withdrawal can result in twitching muscle spasticity causing difficulty with ambulation.  Primary team can consider readdition of this medication to her regimen.  Additionally, her headache is difficult to fully elucidate.  While headaches are a known side effect of the desmoid tumor specific chemotherapy that she was recently trialed on, she has been off of this medication for some time and her symptoms should have abated had they been linked to this medication in particular.  It is possible that her headaches could be due to medication overuse and thus a Tylenol holiday could be considered if it has not been helpful for her overall pain management.  Additionally, Fioricet (a combination of butalbital, acetaminophen, and caffeine, medications against which the patient does not have known intolerances) could be considered if her headaches were to worsen despite attempted minimization of polypharmacy.      Recommendations:  -The chronic pain service is a consult service and does not place orders, just makes recommendations (except ketamine and lidocaine infusions)   -Please evaluate all patients on opioids for appropriateness of prescribing narcan at discharge.  The chronic pain service can assist with this.  Nasal narcan is covered by most insurances.  -Recommendations given apply to the current hospitalization and do not reflect long term recommendations.    - Discontinued lidocaine infusion at scheduled time.  09/28/22 - 10/01/22  - Consider IV Tylenol holiday given concern for medication overuse headache  - If headaches are not improved from IV Tylenol holiday, consider Fioricet once daily as needed for headaches  - Agree with GI recs of naloxegol 12.5 mg p.o. daily.  GI following   - Would recommend reinitiation of baclofen as patient's current symptomatology could potentially be consistent with mild baclofen withdraw, recommend initiation of baclofen 2 mg twice daily per NG tube  - Agree with primary team pain and bowel regimen   - Recommend continued physical therapy to work on bed level mobility   - Would recommend speech therapy consult to work on oral desensitization as she has not been able to tolerate anyone in her mouth such as ice chips or small sips of water  - Recommend involvement of inpatient psychology and complex care given patient's distress surrounding hospitalization      We will continue to follow.    Naloxone Rx at discharge?  Is patient on opioids? Yes.  1)Is dose >50MME?  Yes.  2) Is patient prescribed a benzodiazepine (w opioids)? Yes.  3)Hx of overdose?  No.  4) Hx of substance use disorder? No.  5) Opioids likely to last greater than a week after discharge? Yes.       If yes to 2 or more, prescribe naloxone at discharge.  Nasal narcan for most insured (Nasal narcan 4mg /actuation, prescribe 1 kit, instructions at [email or web address]).  For uninsured,  chronic pain can work to assist in finding an option.  OTC nasal narcan now available at most pharmacies for around $45.    Interim History  There were no Acute Events Overnight.  The patient is not obtaining adequate pain relief on current medication regimen and feels that their pain is Somewhat controlled    Today patient reports pain is largely unchanged but notes she is still suffering from her headache that has been severe at times despite stopping IV HM for ~2 days. Still using oxy 15 mg QID and Suboxone, but abd pain continues to be severe. Notes no BM but also frustration at continued ileus. Notes significant UOP and continued tachycardia and even a syncopal event this AM when trying to work with OT. Notes continued issues with coordination and balance.     Analgesia Evaluation:  Pain at minimum: 0/10  Pain at maximum: 10/10    Current pain medication regimen (including how frequent PRN's were used):  Suboxone 2-0.5mg  TID   Lidocaine infusion 1mg /kg/hr 72 hours   Tylenol IV   Baclofen 10mg  TID on hold   Robaxin IV 250mg  TID   Oxycodone 15mg  q4h PRN (on hold)     Inpatient Medications  Current Facility-Administered Medications   Medication Dose Route Frequency Provider Last Rate Last Admin    acetaminophen (OFIRMEV) 10 mg/mL injection 1,000 mg  1,000 mg Intravenous Q8H [facility] Lambert Mody, MD PhD   Stopped at 10/06/22 1153    [Provider Hold] baclofen (LIORESAL) tablet 10 mg  10 mg Oral TID PRN Rocco Serene, MD   10 mg at 09/27/22 1639    bisacodyl (DULCOLAX) suppository 10 mg  10 mg Rectal BID Lambert Mody, MD PhD        buprenorphine-naloxone (SUBOXONE) 2-0.5 mg SL film 0.5 mg of buprenorphine  0.25 Film Sublingual TID Lambert Mody, MD PhD   0.5 mg of buprenorphine at 10/06/22 1455    calcium carbonate (TUMS) chewable tablet 400 mg elem calcium  400 mg elem calcium Oral Daily PRN Terri Piedra, AGNP        [Provider Hold] cefdinir (OMNICEF) capsule 300 mg  300 mg Oral Q12H [facility] Rocco Serene, MD        [Provider Hold] doxycycline (VIBRA-TABS) tablet 100 mg  100 mg Oral BID Rocco Serene, MD        famotidine (PF) (PEPCID) injection 20 mg  20 mg Intravenous At bedtime Rocco Serene, MD   20 mg at 10/05/22 2040    fat emulsion 20 % (INTRAlipid) infusion 100 mL  100 mL Intravenous Once PRN Lobonc, Ammie Ferrier, MD        fat emulsion 20 % (INTRAlipid) infusion 200 mL  200 mL Intravenous Once PRN Lobonc, Ammie Ferrier, MD        Parenteral Nutrition (CENTRAL)   Intravenous Continuous Lambert Mody, MD PhD        And    fat emulsion 20 % with fish oil (SMOFlipid) infusion 250 mL  250 mL Intravenous Continuous Lambert Mody, MD PhD        LORazepam (ATIVAN) injection 0.5 mg  0.5 mg Intravenous Q8H PRN Lambert Mody, MD PhD   0.5 mg at 10/06/22 1702    [Provider Hold] LORazepam (ATIVAN) tablet 0.5 mg 0.5 mg Oral Q6H PRN Rocco Serene, MD        [Provider Hold] melatonin tablet 3 mg  3 mg Oral Nightly PRN Terri Piedra,  AGNP        [Provider Hold] mirtazapine (REMERON) tablet 15 mg  15 mg Oral Nightly Terri Piedra, AGNP   15 mg at 09/26/22 2316    naloxegol (MOVANTIK) 12.5 mg tablet 12.5 mg  12.5 mg Oral Daily [name], MD   12.5 mg at 10/06/22 0829    ondansetron (ZOFRAN) injection 4 mg  4 mg Intravenous Q6H PRN Terri Piedra, AGNP   4 mg at 10/06/22 1134    oxyCODONE (ROXICODONE) immediate release tablet 15 mg  15 mg Oral Q4H PRN [name], MD   15 mg at 10/06/22 1513    pantoprazole (Protonix) injection 40 mg  40 mg Intravenous BID Rocco Serene, MD   40 mg at 10/06/22 0347    Parenteral Nutrition (CENTRAL)   Intravenous Continuous Lambert Mody, MD PhD 45 mL/hr at 10/05/22 2240 New Bag at 10/05/22 2240    phenol (CHLORASEPTIC) 1.4 % spray 2 spray  2 spray Mucous Membrane Q2H PRN Rocco Serene, MD   2 spray at 10/03/22 0138    [Provider Hold] polyethylene glycol (MIRALAX) packet 17 g  17 g Oral Daily PRN Terri Piedra, AGNP        promethazine (PHENERGAN) injection 6.25 mg  6.25 mg Intravenous Q6H PRN Rocco Serene, MD   6.25 mg at 10/06/22 1638    sodium chloride 0.45% (1/2 NS) infusion  100 mL/hr Intravenous Continuous [name], MD 100 mL/hr at 10/06/22 1709 100 mL/hr at 10/06/22 1709         Objective:     Vital Signs    Temp:  [36.4 ??C (97.5 ??F)-36.9 ??C (98.5 ??F)] 36.6 ??C (97.9 ??F)  Heart Rate:  [68-104] 68  SpO2 Pulse:  [67-97] 68  Resp:  [10-22] 15  BP: (96-128)/(48-64) 96/48  MAP (mmHg):  [60-79] 63  SpO2:  [98 %-100 %] 98 %      Physical Exam     GENERAL:  Well developed, well-nourished female and looking uncomfortable from pain and NG tube discomfort. Initially asleep in bed with blankets over her head but easily woken up and converses casually   HEAD/NECK:    Reveals normocephalic/atraumatic.   CARDIOVASCULAR:   WWP, regular tate  LUNGS:   Normal work of breathing, [place]  EXTREMITIES:  Warm, no clubbing, cyanosis, or edema was noted.  NEUROLOGIC:    The patient was alert and oriented times four with normal language, attention, cognition and memory. Cranial nerve exam was grossly normal.    MUSCULOSKELETAL:    SKIN:  No obvious rashes lesions or erythema  PSY:  Appropriate affect and mood.    Test Results    Lab Results   Component Value Date    CREATININE 0.48 (L) 10/06/2022     Lab Results   Component Value Date    ALKPHOS 72 10/05/2022    BILITOT 0.2 (L) 10/05/2022    BILIDIR <0.10 08/08/2022    PROT 5.8 10/05/2022    ALBUMIN 3.1 (L) 10/05/2022    ALT 20 10/05/2022    AST 20 10/05/2022           Problem List    Principal Problem:    Other acute postprocedural pain  Active Problems:    [name]    Intestinal polyps    [name]    Neoplasm related pain    Physical deconditioning    History of colectomy    Intractable abdominal pain  Generalized anxiety disorder with panic attacks    SBO (small bowel obstruction) (CMS-HCC)    Small intestinal bacterial overgrowth (SIBO)      Nat Christen, MD  Assistant Professor of Anesthesiology  Department of Anesthesiology   Divisions of General Anesthesiology and Pain Medicine     Medical Decision Making    Problems Addressed:  1 or more chronic illnesses with severe exacerbation, progression, or side effects of treatment (high)    Amount/Complexity:  Reviewed external records: Primary team progress note  Reviewed results: 7/28 CBC, 7/29 BMP  Ordered tests: none  Assessment requiring independent historian: patient's mother  Discussion of management/test results with medical professional: Primary team  Independent interpretation of test: none    Risk  High: Drug therapy requiring intensive monitoring for toxicity, Decision regarding hospitalization, Elective Major Surgery w/ risk factors, Emergency Major Surgery, Decision to DNR or de-escalate care because of poor prognosis, parental controlled substances (IV opioids)

## 2022-10-07 NOTE — Unmapped (Signed)
Pt is A/Ox4, VSS on 2L [place]. C/o pain, x2 doses of PRN oxy given. Continued nausea - PRN phenergan and zofran given. X1 dose of ativan given for anxiety. NG tube clamped majority of day per MD instruction. Unclamped for a brief period of time due to worsening pain/distension/nausea. ~231mL output. Poor PO intake. Adequate UOP, x1 small BM. TPN infusing. 1/2 NS @ 144mL/hr continued. Mom at bedside and updated. See flowsheets/MAR for more info.     Problem: Adult Inpatient Plan of Care  Goal: Plan of Care Review  Outcome: Ongoing - Unchanged  Goal: Patient-Specific Goal (Individualized)  Outcome: Ongoing - Unchanged  Goal: Absence of Hospital-Acquired Illness or Injury  Outcome: Ongoing - Unchanged  Intervention: Identify and Manage Fall Risk  Recent Flowsheet Documentation  Taken 10/07/2022 0800 by Hassie Bruce, RN  Safety Interventions:   aspiration precautions   bleeding precautions   commode/urinal/bedpan at bedside   environmental modification   fall reduction program maintained   infection management   lighting adjusted for tasks/safety   low bed   nonskid shoes/slippers when out of bed   room near unit station  Intervention: Prevent Infection  Recent Flowsheet Documentation  Taken 10/07/2022 0800 by Hassie Bruce, RN  Infection Prevention:   environmental surveillance performed   equipment surfaces disinfected   hand hygiene promoted   personal protective equipment utilized   rest/sleep promoted   single patient room provided  Goal: Optimal Comfort and Wellbeing  Outcome: Ongoing - Unchanged  Goal: Readiness for Transition of Care  Outcome: Ongoing - Unchanged  Goal: Rounds/Family Conference  Outcome: Ongoing - Unchanged     Problem: Latex Allergy  Goal: Absence of Allergy Symptoms  Outcome: Ongoing - Unchanged     Problem: Fall Injury Risk  Goal: Absence of Fall and Fall-Related Injury  Outcome: Ongoing - Unchanged  Intervention: Promote Injury-Free Environment  Recent Flowsheet Documentation  Taken 10/07/2022 0800 by Hassie Bruce, RN  Safety Interventions:   aspiration precautions   bleeding precautions   commode/urinal/bedpan at bedside   environmental modification   fall reduction program maintained   infection management   lighting adjusted for tasks/safety   low bed   nonskid shoes/slippers when out of bed   room near unit station     Problem: Skin Injury Risk Increased  Goal: Skin Health and Integrity  Outcome: Ongoing - Unchanged  Intervention: Optimize Skin Protection  Recent Flowsheet Documentation  Taken 10/07/2022 1541 by Hassie Bruce, RN  Head of Bed [facility]) Positioning: HOB at 30-45 degrees  Taken 10/07/2022 1125 by Hassie Bruce, RN  Head of Bed [facility]) Positioning: HOB at 30-45 degrees  Taken 10/07/2022 0800 by Hassie Bruce, RN  Pressure Reduction Techniques:   frequent weight shift encouraged   heels elevated off bed   weight shift assistance provided  Head of Bed [facility]) Positioning: HOB at 30-45 degrees  Taken 10/07/2022 0744 by Hassie Bruce, RN  Head of Bed [facility]) Positioning: HOB at 30-45 degrees     Problem: Self-Care Deficit  Goal: Improved Ability to Complete Activities of Daily Living  Outcome: Ongoing - Unchanged

## 2022-10-07 NOTE — Unmapped (Signed)
Daily Progress Note    Assessment/Plan:    Principal Problem:    Other acute postprocedural pain  Active Problems:    [name]    Intestinal polyps    [name]    Neoplasm related pain    Physical deconditioning    History of colectomy    Intractable abdominal pain    Generalized anxiety disorder with panic attacks    SBO (small bowel obstruction) (CMS-HCC)    Small intestinal bacterial overgrowth (SIBO)  Resolved Problems:    * No resolved hospital problems. 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    #. Ileus, multifactorial but probably related to opiates:  #. Inability to tolerate PO, without diagnosis of malnutrition:  - continue TPN for nutritional support  - continue naloxegol --> increase dose 7/30  - continue discussion around readiness for removal of NGT; as I conveyed to the patient, I worry about duration of placement of NGT and potential for complications if not eventually addressed   - continue bowel regimen; added back twice daily scheduled suppository in discussion with GI  - limit opiates, noting however she takes chronically     #. Headache, now daily (chronic?):  - trial of IVF, magnesium --> not much different with this 7/28  - she declined compazine, had trial of benadryl --> not helpful and would avoid IV benadryl moving forward (has been discussed with patient by me and [name])  - trial OFF apap in case of rebound/cyclical headache --> if unsuccessful, trial other meds noting limitations from prior intolerances to meds     #. Muscle spasms and diffuse pain, uncertain etiology:  - occurred post-procedurally, with reassuring evaluation (MRI spine, neuro c/s, CK normal)  - transition back to baclofen, dose reduce to 5mg  BID scheduled  - ativan prn (PTA med), but caution against escalating (noting risks of concurrent benzos + opiates, possibility of contribution to syncope and other symptoms)  - oxycodone for severe pain, with caution given narcotic bowel   - follow chronic pain recs    #. Coffee-ground emesis:  - noted in NGT 7/21, improved; reason, however, to reassess trial of NG clamping and attempt removal as soon as able, as there may be risk of trauma a/w tube  - continue IV PPI twice daily    Chronic/stable/resolved problems:  #. [name]: hold nirogacestat   #. SIBO: hold home cefdinir, doxy  #. Desmoid tumors: resume nirogacestat as outpatient, follow with GI, VIR for steroid injections     ___________________________________________________________________    Subjective:  - episode of LOC yesterday  - none since  - in line with prior sessions  - BP was somewhat better on my exam; not sure at time of this precipitated event  - she had some broth, then more output in NGT  - less output today  - very anxious, upset; long discussion with many questions from patient and mother  - met subsequently with AYA social worker to discuss care, strategies for consistent care and communication         Recent Results (from the past  24 hour(s))   Basic Metabolic Panel    Collection Time: 10/06/22 11:33 AM   Result Value Ref Range    Sodium 138 135 - 145 mmol/L    Potassium 6.6 (HH) 3.4 - 4.8 mmol/L    Chloride 105 98 - 107 mmol/L    CO2 27.0 20.0 - 31.0 mmol/L    Anion Gap 6 5 - 14 mmol/L    BUN 13 9 - 23 mg/dL    Creatinine 4.13 (L) 0.55 - 1.02 mg/dL    BUN/Creatinine Ratio 27     eGFR CKD-EPI (2021) Female >90 >=60 mL/min/1.95m2    Glucose 559 (HH) 70 - 179 mg/dL    Calcium 8.4 (L) 8.7 - 10.4 mg/dL   Phosphorus Level    Collection Time: 10/06/22 11:33 AM   Result Value Ref Range Phosphorus 6.5 (H) 2.4 - 5.1 mg/dL   Magnesium Level    Collection Time: 10/06/22 11:33 AM   Result Value Ref Range    Magnesium 2.6 1.6 - 2.6 mg/dL   Basic Metabolic Panel    Collection Time: 10/06/22  1:21 PM   Result Value Ref Range    Sodium 140 135 - 145 mmol/L    Potassium 4.0 3.4 - 4.8 mmol/L    Chloride 108 (H) 98 - 107 mmol/L    CO2 30.0 20.0 - 31.0 mmol/L    Anion Gap 2 (L) 5 - 14 mmol/L    BUN 14 9 - 23 mg/dL    Creatinine 2.44 (L) 0.55 - 1.02 mg/dL    BUN/Creatinine Ratio 29     eGFR CKD-EPI (2021) Female >90 >=60 mL/min/1.19m2    Glucose 107 70 - 179 mg/dL    Calcium 8.8 8.7 - 01.0 mg/dL   Phosphorus Level    Collection Time: 10/06/22  1:21 PM   Result Value Ref Range    Phosphorus 4.5 2.4 - 5.1 mg/dL   Magnesium Level    Collection Time: 10/06/22  1:21 PM   Result Value Ref Range    Magnesium 2.1 1.6 - 2.6 mg/dL   Basic Metabolic Panel    Collection Time: 10/07/22  6:14 AM   Result Value Ref Range    Sodium 137 135 - 145 mmol/L    Potassium 6.4 (HH) 3.4 - 4.8 mmol/L    Chloride 102 98 - 107 mmol/L    CO2 26.0 20.0 - 31.0 mmol/L    Anion Gap 9 5 - 14 mmol/L    BUN 13 9 - 23 mg/dL    Creatinine 2.72 (L) 0.55 - 1.02 mg/dL    BUN/Creatinine Ratio 39     eGFR CKD-EPI (2021) Female >90 >=60 mL/min/1.47m2    Glucose 581 (HH) 70 - 179 mg/dL    Calcium 8.2 (L) 8.7 - 10.4 mg/dL   Magnesium Level    Collection Time: 10/07/22  6:14 AM   Result Value Ref Range    Magnesium 2.5 1.6 - 2.6 mg/dL   Phosphorus Level    Collection Time: 10/07/22  6:14 AM   Result Value Ref Range    Phosphorus 5.5 (H) 2.4 - 5.1 mg/dL       Labs/Studies:  Labs and Studies from the last 24hrs per EMR and Reviewed    Objective:  BP 99/37  - Pulse 82  - Temp 36.5 ??C (97.7 ??F) (Axillary)  - Resp 18  - Ht 165.1 cm (5' 5)  - Wt 60.8 kg (134 lb 1.6 oz)  - SpO2 99%  - BMI 22.32  kg/m??     -declined exam today

## 2022-10-08 LAB — BASIC METABOLIC PANEL
ANION GAP: 6 mmol/L (ref 5–14)
BLOOD UREA NITROGEN: 12 mg/dL (ref 9–23)
BUN / CREAT RATIO: 29
CALCIUM: 9 mg/dL (ref 8.7–10.4)
CHLORIDE: 108 mmol/L — ABNORMAL HIGH (ref 98–107)
CO2: 28 mmol/L (ref 20.0–31.0)
CREATININE: 0.42 mg/dL — ABNORMAL LOW
EGFR CKD-EPI (2021) FEMALE: >90 mL/min/{1.73_m2} (ref >=60)
GLUCOSE RANDOM: 116 mg/dL (ref 70–179)
POTASSIUM: 3.7 mmol/L (ref 3.4–4.8)
SODIUM: 142 mmol/L (ref 135–145)

## 2022-10-08 LAB — PHOSPHORUS: PHOSPHORUS: 4.6 mg/dL (ref 2.4–5.1)

## 2022-10-08 LAB — MAGNESIUM: MAGNESIUM: 2 mg/dL (ref 1.6–2.6)

## 2022-10-08 MED ADMIN — oxyCODONE (ROXICODONE) immediate release tablet 15 mg: 15 mg | ORAL | @ 04:00:00 | Stop: 2022-10-09 | NDC 72865012805

## 2022-10-08 MED ADMIN — ondansetron (ZOFRAN) injection 4 mg: 4 mg | INTRAVENOUS | @ 23:00:00 | NDC 71930001752

## 2022-10-08 MED ADMIN — ondansetron (ZOFRAN) injection 4 mg: 4 mg | INTRAVENOUS | @ 02:00:00 | NDC 71930001752

## 2022-10-08 MED ADMIN — ibuprofen (MOTRIN) tablet 400 mg: 400 mg | ORAL | @ 02:00:00 | Stop: 2022-10-07 | NDC 76420057590

## 2022-10-08 MED ADMIN — promethazine (PHENERGAN) injection 6.25 mg: 6.25 mg | INTRAVENOUS | @ 12:00:00 | Stop: 2022-10-08 | NDC 60977000143

## 2022-10-08 MED ADMIN — baclofen (LIORESAL) tablet 5 mg: 5 mg | ORAL | NDC 70257056320

## 2022-10-08 MED ADMIN — oxyCODONE (ROXICODONE) immediate release tablet 15 mg: 15 mg | ORAL | Stop: 2022-10-07 | NDC 72865012805

## 2022-10-08 MED ADMIN — Parenteral Nutrition (CENTRAL): INTRAVENOUS | @ 03:00:00 | Stop: 2022-10-08 | NDC 96295013582

## 2022-10-08 MED ADMIN — buprenorphine-naloxone (SUBOXONE) 2-0.5 mg SL film 0.5 mg of buprenorphine: .25 | SUBLINGUAL | @ 20:00:00 | NDC 52427069211

## 2022-10-08 MED ADMIN — naloxegol (MOVANTIK) tablet 25 mg: 25 mg | ORAL | @ 14:00:00 | Stop: 2022-10-08 | NDC 82625880203

## 2022-10-08 MED ADMIN — LORazepam (ATIVAN) tablet 0.5 mg: .5 mg | ORAL | @ 02:00:00 | NDC 76420064790

## 2022-10-08 MED ADMIN — sodium chloride 0.45% (1/2 NS) infusion: 100 mL/h | INTRAVENOUS | @ 02:00:00 | NDC 68258901501

## 2022-10-08 MED ADMIN — sodium chloride 0.45% (1/2 NS) infusion: 100 mL/h | INTRAVENOUS | @ 23:00:00 | NDC 68258901501

## 2022-10-08 MED ADMIN — buprenorphine-naloxone (SUBOXONE) 2-0.5 mg SL film 0.5 mg of buprenorphine: .25 | SUBLINGUAL | @ 03:00:00 | NDC 52427069211

## 2022-10-08 MED ADMIN — oxyCODONE (ROXICODONE) immediate release tablet 15 mg: 15 mg | ORAL | @ 12:00:00 | Stop: 2022-10-09 | NDC 72865012805

## 2022-10-08 MED ADMIN — pantoprazole (Protonix) injection 40 mg: 40 mg | INTRAVENOUS | @ 13:00:00 | NDC 82009001190

## 2022-10-08 MED ADMIN — methylnaltrexone (RELISTOR) injection 8 mg: 8 mg | SUBCUTANEOUS | @ 19:00:00 | NDC 65649055204

## 2022-10-08 MED ADMIN — famotidine (PF) (PEPCID) injection 20 mg: 20 mg | INTRAVENOUS | NDC 96619043921

## 2022-10-08 MED ADMIN — baclofen (LIORESAL) tablet 5 mg: 5 mg | ORAL | @ 13:00:00 | Stop: 2022-10-08 | NDC 70257056320

## 2022-10-08 MED ADMIN — oxyCODONE (ROXICODONE) immediate release tablet 15 mg: 15 mg | ORAL | @ 16:00:00 | Stop: 2022-10-09 | NDC 72865012805

## 2022-10-08 MED ADMIN — oxyCODONE (ROXICODONE) immediate release tablet 15 mg: 15 mg | ORAL | @ 21:00:00 | Stop: 2022-10-09 | NDC 72865012805

## 2022-10-08 MED ADMIN — promethazine (PHENERGAN) injection 6.25 mg: 6.25 mg | INTRAVENOUS | @ 19:00:00 | Stop: 2022-10-08 | NDC 60977000143

## 2022-10-08 MED ADMIN — promethazine (PHENERGAN) injection 6.25 mg: 6.25 mg | INTRAVENOUS | @ 06:00:00 | Stop: 2022-10-08 | NDC 60977000143

## 2022-10-08 MED ADMIN — buprenorphine-naloxone (SUBOXONE) 2-0.5 mg SL film 0.5 mg of buprenorphine: .25 | SUBLINGUAL | @ 13:00:00 | NDC 52427069211

## 2022-10-08 MED ADMIN — ondansetron (ZOFRAN) injection 4 mg: 4 mg | INTRAVENOUS | @ 16:00:00 | NDC 71930001752

## 2022-10-08 MED ADMIN — pantoprazole (Protonix) injection 40 mg: 40 mg | INTRAVENOUS | NDC 82009001190

## 2022-10-08 MED ADMIN — fat emulsion 20 % with fish oil (SMOFlipid) infusion 250 mL: 250 mL | INTRAVENOUS | @ 03:00:00 | Stop: 2022-10-08 | NDC 63323082074

## 2022-10-08 NOTE — Unmapped (Signed)
[facility]  Initial Psychiatry Consult Note      Date of admission: 09/25/2022 10:30 AM  Service Date: October 08, 2022  Primary Team: [facility])  LOS:  LOS: 11 days      Assessment:   [name] [name] is a 22 y.o. 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 and reported past psych history of GAD and MDD, admitted 09/25/2022 10:30 AM for other acute postprocedural pain following injection of desmoid tumor with steroid and anesthetic under ultrasound guidance.  Patient was seen in consultation by request of [name] Mody, MD PhD for evaluation of anxiety and depression.     [name] [name] presents with symptoms consistent with a diagnosis of GAD and MDD, consistent with her historical diagnoses. Patient's symptoms appear to have worsened in the context of discontinuation of mirtazapine and ongoing medical stressors, including loss of independence and limited movement in achieving her longer term goals (e.g., completing nursing school). They are consistent with past reports of above diagnoses and include depressed mood, sleep disturbance, fatigue, anhedonia, passive thoughts of death, and increased anxiety with panic/anxiety attacks. As patient has been receiving PO medications crushed by means of NG, it would be appropriate to restart mirtazapine at patient's elected dose as noted below to help with both depression and anxiety.     Diagnoses:   Active Hospital problems:  Principal Problem:    Other acute postprocedural pain  Active Problems:    [name]    Intestinal polyps    [name]    Neoplasm related pain    Physical deconditioning    History of colectomy    Intractable abdominal pain    Generalized anxiety disorder with panic attacks    SBO (small bowel obstruction) (CMS-HCC)    Small intestinal bacterial overgrowth (SIBO)       Problems edited/added by me:  No problems updated.    Risk Assessment:  ASQ screening result: not completed    -A suicide and violence risk assessment was performed as part of this evaluation. Risk factors for self-harm/suicide: suicidal ideation or threats without a plan, sense of isolation, history of depression, and chronic severe medical condition.  Protective factors against self-harm/suicide:  restricted access to highly lethal means of suicide, no history of previous suicide attempts , motivation for treatment, currently receiving mental health treatment, and supportive family.  Risk factors for harm to others: N/A. Protective factors against harm to others: no known history of violence towards others, no active symptoms of psychosis, no active symptoms of mania, high intellectual functioning, and positive social orientation.     Current suicide risk: low risk  Current homicide risk: low risk      Recommendations:     Safety and Observation Level:   -- This patient does NOT meet involuntary commitment (IVC) criteria. IVC paperwork was never filed. Please contact our team if there is a concern that risk level has changed.  -- Recommend routine observation per discretion of primary team.    Medications:  -- restart mirtazapine 15mg  qHS for mood and anxiety  - if side effects occur with the medication (including daytime somnolence), it may be appropriate to start at 7.5mg  qHS and re-titrate to 15mg  as appropriate  - continue lorazepam 0.5mg  TID PRN severe anxiety    Further Work-up:   -- No further recommendations at this time from a psychiatric standpoint    Behavioral / Environmental:   --  Utilize compassion and acknowledge the patient's experiences while setting clear and realistic expectations for care.     Follow-up:  -- When patient is discharged, please ensure that their AVS includes information about the 45 Suicide & Crisis Lifeline.  -- The patient currently receives mental health care with [facility] and we will attempt to coordinate followup with our psychiatry team on discharge  -- We will follow as needed at this time.     Thank you for this consult request. Recommendations have been communicated to the primary team. Please page [phone]  for any questions or concerns.     Discussed with and seen by Attending, [name] Maxwell, MD, who agrees with the assessment and plan.    I was present during the entire time of the patient encounter.  Documentation assistance was provided by the medical scribe [name] [name] at my direction. The information in this document accurately reflects the services I personally performed and the decisions made by me. I have reviewed, edited, and approved this document for accuracy.     [name] Sox, MD        Subjective     Relevant Aspects of Hospital Course: Admitted on 09/25/2022 for other acute postprocedural pain following injection of desmoid tumor with steroid and anesthetic under ultrasound guidance. Patient has required numerous hospitalizations for her acute exacerbation of chronic pain or ileus, most recently a ~1 month stay in May 2024. She was admitted upon experiencing acute pain s/p injection of steroid/anesthetic into desmoid tumors of the R wrist/forearm with Interventional Radiology on 7/18. Historically, pt had underwent similar procedures and experienced significant pain and shaking following procedures.   - RRT called on 7/19 at 1017 for  inability to breath, retching and rigidity. Upon arrival, pt was retching, diaphoretic, tachycardic. Reportedly patient was conversant and following commands. She presented with concerns for epsis versus anesthesia induced muscle rigidity versus peritonitis.  - Pt c/o nausea with episodes of retching, rigidity and tenderness in her abdomen. She also endorses intermittent muscle spasms and diffuse pain. She continues to have poor sleep d/t significant nausea and abdominal pain.   - Per [facility] CSW (7/26), pt has been highly agitated and exhausted the past two days.   - [name] [name] expressed anxiousness and frustration multiple times in regards to the length of her stay, and the weakness she feels during this admission compared to prior. She is managing her anxiety with Ativan 0.5 mg PRN, requesting around 2-4 times daily; She was decreased from Ativan 0.5 mg PRN q6H to Ativan 0.5 mg PRN TID on 7/30.     HPI:   Pt is sitting in her chair with mom present in the room. She states that [name] (AYA CSW) had recommended seeking consultation with inpatient psychiatry to help manage pt's anxiety. Shares that she was following with Dr. Malcolm Metro for her outpatient psychiatric care. Pt explains that she was very sick, detailing to have underwent multiple hospitalizations in the past. She communicates moments of frustrations during this hospital course. Describes that she is feeling terrible and trapped. Pt adds that she feels like she is in a prison.  Pt expresses worsening anxiety and fear for her safety in the hospital. Pt endorses difficulty moving and walking, though notes improvement during PT session today. Pt adds that she was able to walk in the hallway with help of 2 people. She reports that she visited the hospital on 7/18 for an outpatient procedure, involving steroid injections to her to the R  forearm for her desmoid tumor. She experienced stiffness and seizure-like shakes on the same-day after the procedure. She notes that she had SOB and low lactate levels when admitted to the ED. Pt had plans of returning to nursing school in the fall after her procedure. Mother shares that pt was scheduled to take 2 classes, but those plans have derailed with pt's hospitalization. Pt is currently considering if she can take 1 class instead. She expresses sadness and frustration from the loss of her independence. She voices that she used to drive to school, but now she needs to rely on her mother for transport, bathing and using the bathroom. Pt shares that her romantic relationship ended d/t her medical issues. She endorsed SI in the past, and alludes to recent SI as well. She confirms that she doesn't have any intent or plans to end her life. Mother reports that pt feels like she cannot move forward: since she is missing out on the best years of her life. Pt explains that she feels envious when seeing her friends complete their first year in the workforce post nursing school. Pt wishes she can continue on Ativan 0.5 mg PRN TID, stating that her Ativan PRN was previously q6H. Mother adds that pt's BP is dropping so primary team wanted to adjust pt's Ativan. Pt confirms that her Ativan was switched to PO (from IV) recently. Pt finds getting the Ativan through IV is more beneficial and fast-acting. She details that she has a more anxious temperament, noting that she had broken down in tears when she was in public after her discharge. Pt states that she has a difficult time adjusting after her discharge. She also notes that the influx of hormones from the egg retrieval have not helped her mood. Pt states that she is not taking Remeron currently, because pt is unable to take anything by mouth (including solution and pills). Describes that her oral medications are being reintroduced by crushing the pills through her NG tube.She states that she previously took the Remeron for 3 years and found the medication to improved her anxiety at home. Shares that she experienced GI bleeding with Lexapro and SSRIs, while Cymbalta made her withdraw socially. Pt notes that she might have experienced urinary retention with hydroxyzine and Seroquel .Her mother reports that pt had an upcoming urology outpatient visit to see if pt was clear to restart hydroxyzine. Pt reports that she can only fall asleep if she is really exhausted.    This evaluation was completed via collecting data from the following - Reviewed medical records in Epic  - Spoke to patient's mother who was involved in the conversation as noted above .     ROS: positive for depression, anxiety    Psychiatric History:   Prior psychiatric diagnoses: GAD and MDD  Psychiatric hospitalizations: Patient denies   Suicide attempts / Non-suicidal self-injury: Patient denies   Medication trials: Atarax 50 mg BID PRN, Remeron 22.5 mg, Ativan 0.5 mg TID PRN, Cymbalta, Zyprexa, Seroquel, Lexapro (stopped due to bleeding)   Current psychiatrist: [facility] (Dr. Malcolm Metro)  Current therapist: [name], LCSW - [facility] Clinical Social Worker  Other treatments: Ketamine    Family Psychiatric History: Denies family history of depressive disorders and bipolar and related disorders    Substance Use History:  Tobacco use: Pt denies smoking or use of smokeless tobacco.   Alcohol use:  Reports no history of alcohol use.   Other substance use: Reports no history of drug use.  Substance use disorder treatment: Denies  UDS results: n/a  BAL on admission: n/a    Social History:   Patient lives with parent in a private residence.   Highest level of education: Attended [employer], [place] for nursing, has been interrupted by health issues  Important relationships: mother  Employment status: Unemployed   Legal history: n/a  Hotel manager history: n/a  Firearms: Yes, Secured    Medical History:   Past Medical History:   Diagnosis Date    Abdominal pain     Acid reflux     occas    Anesthesia complication     itching, shaking, coldness; last few surgeries have gone much better    Cancer (CMS-HCC)     Cataract of right eye     COVID-19 virus infection 01/2019    Cyst of thyroid determined by ultrasound     monitoring    [name]     2 right forearm, 1 left thigh, 1 right scapula, 1 under left clavicle; multiple    Difficult intravenous access     [name] (familial adenomatous polyposis)     [name]     Gastric polyps     History of chemotherapy     last treatment approx 05/2019    History of colon polyps     History of COVID-19 01/2019    Iron deficiency anemia due to chronic blood loss     received iron infusion 11-2019    PONV (postoperative nausea and vomiting)     Rectal bleeding     Syncopal episodes     especially if becoming dehydrated       Surgical History:  Past Surgical History:   Procedure Laterality Date    COLON SURGERY      cyroablation      cystis removal      desmoid removal      PR CLOSE ENTEROSTOMY,RESEC+ANAST N/A 10/09/2020    Procedure: ILEOSTOMY TAKEDOWN;  Surgeon: Mickle Asper, MD;  Location: OR [place];  Service: General Surgery    PR COLONOSCOPY W/BIOPSY SINGLE/MULTIPLE N/A 10/27/2012    Procedure: COLONOSCOPY, FLEXIBLE, PROXIMAL TO SPLENIC FLEXURE; WITH BIOPSY, SINGLE OR MULTIPLE;  Surgeon: Shirlyn Goltz Mir, MD;  Location: PEDS PROCEDURE ROOM [place];  Service: Gastroenterology    PR COLONOSCOPY W/BIOPSY SINGLE/MULTIPLE N/A 09/14/2013    Procedure: COLONOSCOPY, FLEXIBLE, PROXIMAL TO SPLENIC FLEXURE; WITH BIOPSY, SINGLE OR MULTIPLE;  Surgeon: Shirlyn Goltz Mir, MD;  Location: PEDS PROCEDURE ROOM [place];  Service: Gastroenterology    PR COLONOSCOPY W/BIOPSY SINGLE/MULTIPLE N/A 11/08/2014    Procedure: COLONOSCOPY, FLEXIBLE, PROXIMAL TO SPLENIC FLEXURE; WITH BIOPSY, SINGLE OR MULTIPLE;  Surgeon: Arnold Long Mir, MD;  Location: PEDS PROCEDURE ROOM [facility];  Service: Gastroenterology    PR COLONOSCOPY W/BIOPSY SINGLE/MULTIPLE N/A 12/26/2015    Procedure: COLONOSCOPY, FLEXIBLE, PROXIMAL TO SPLENIC FLEXURE; WITH BIOPSY, SINGLE OR MULTIPLE;  Surgeon: Arnold Long Mir, MD;  Location: PEDS PROCEDURE ROOM [facility];  Service: Gastroenterology    PR COLONOSCOPY W/BIOPSY SINGLE/MULTIPLE N/A 09/02/2017    Procedure: COLONOSCOPY, FLEXIBLE, PROXIMAL TO SPLENIC FLEXURE; WITH BIOPSY, SINGLE OR MULTIPLE;  Surgeon: Arnold Long Mir, MD;  Location: PEDS PROCEDURE ROOM [place];  Service: Gastroenterology    PR COLSC FLX W/REMOVAL LESION BY HOT BX FORCEPS N/A 08/27/2016    Procedure: COLONOSCOPY, FLEXIBLE, PROXIMAL TO SPLENIC FLEXURE; W/REMOVAL TUMOR/POLYP/OTHER LESION, HOT BX FORCEP/CAUTE;  Surgeon: Arnold Long Mir, MD; Location: PEDS PROCEDURE ROOM [facility];  Service: Gastroenterology    PR COLSC FLX W/RMVL OF TUMOR POLYP LESION  SNARE TQ N/A 02/25/2019    Procedure: COLONOSCOPY FLEX; W/REMOV TUMOR/LES BY SNARE;  Surgeon: Helyn Numbers, MD;  Location: GI PROCEDURES [place] [facility];  Service: Gastroenterology    PR COLSC FLX W/RMVL OF TUMOR POLYP LESION SNARE TQ N/A 03/13/2020    Procedure: COLONOSCOPY FLEX; W/REMOV TUMOR/LES BY SNARE;  Surgeon: Helyn Numbers, MD;  Location: [facility]ED[facility] [facility];  Service: Gastroenterology    PR EXC SKIN BENIG 2.1-3 CM TRUNK,ARM,LEG Right 02/25/2017    Procedure: EXCISION, BENIGN LESION INCLUDE MARGINS, EXCEPT SKIN TAG, LEGS; EXCISED DIAMETER 2.1 TO 3.0 CM;  Surgeon: Clarene Duke, MD;  Location: CHILDRENS OR [facility];  Service: Plastics    PR EXC SKIN BENIG 3.1-4 CM TRUNK,ARM,LEG Right 02/25/2017    Procedure: EXCISION, BENIGN LESION INCLUDE MARGINS, EXCEPT SKIN TAG, ARMS; EXCISED DIAMETER 3.1 TO 4.0 CM;  Surgeon: Clarene Duke, MD;  Location: CHILDRENS OR [facility];  Service: Plastics    PR EXC SKIN BENIG >4 CM FACE,FACIAL Right 02/25/2017    Procedure: EXCISION, OTHER BENIGN LES INCLUD MARGIN, FACE/EARS/EYELIDS/NOSE/LIPS/MUCOUS MEMBRANE; EXCISED DIAM >4.0 CM;  Surgeon: Clarene Duke, MD;  Location: CHILDRENS OR [facility];  Service: Plastics    PR EXC TUMOR SOFT TISSUE LEG/ANKLE SUBQ 3+CM Right 08/05/2019    Procedure: EXCISION, TUMOR, SOFT TISSUE OF LEG OR ANKLE AREA, SUBCUTANEOUS; 3 CM OR GREATER;  Surgeon: Arsenio Katz, MD;  Location: MAIN OR [place];  Service: Plastics    PR EXC TUMOR SOFT TISSUE LEG/ANKLE SUBQ <3CM Right 08/05/2019    Procedure: EXCISION, TUMOR, SOFT TISSUE OF LEG OR ANKLE AREA, SUBCUTANEOUS; LESS THAN 3 CM;  Surgeon: Arsenio Katz, MD;  Location: MAIN OR [facility];  Service: Plastics    PR LAP, SURG PROCTECTOMY W J-POUCH N/A 08/10/2020    Procedure: ROBOTIC ASSISTED LAPAROSCOPIC PROCTOCOLECTOMY, ILEAL J POUCH, WITH OSTOMY;  Surgeon: Mickle Asper, MD;  Location: OR [place];  Service: General Surgery    PR NDSC EVAL INTSTINAL POUCH DX W/COLLJ SPEC SPX N/A 01/23/2021    Procedure: ENDO EVAL SM INTEST POUCH; DX;  Surgeon: Modena Nunnery, MD;  Location[facility] [place] [facility];  Service: Gastroenterology    PR NDSC EVAL INTSTINAL POUCH DX W/COLLJ SPEC SPX N/A 08/27/2021    Procedure: ENDO EVAL SM INTEST POUCH; DX;  Surgeon: Hunt Oris, MD;  Location: [facility]URES [facility];  Service: Gastroenterology    PR NDSC EVAL INTSTINAL POUCH DX W/COLLJ SPEC SPX N/A 12/09/2021    Procedure: ENDO EVAL SM INTEST POUCH; DX;  Surgeon: Vidal Schwalbe, MD;  Location: GI PROCEDURES [facility];  Service: Gastroenterology    PR NDSC EVAL INTSTINAL POUCH DX W/COLLJ [facility] SPX Left 04/09/2022    Procedure: ENDO EVAL SM INTEST POUCH; DX;  Surgeon: Modena Nunnery, MD;  Location: [facility] [place] [facility];  Service: Gastroenterology    PR NDSC EVAL INTSTINAL POUCH DX W/COLLJ SPEC SPX N/A 08/05/2022    Procedure: ENDO EVAL SM INTEST POUCH; DX;  Surgeon: Modena Nunnery, MD;  Location: GI PROCEDURES [facility];  Service: Gastroenterology    PR NDSC EVAL INTSTINAL POUCH W/BX SINGLE/MULTIPLE N/A 01/20/2022    Procedure: ENDOSCOPIC EVAL OF SMALL INTESTINAL POUCH; DIAGNOSTIC, No biopsies;  Surgeon: Andrey Farmer, MD;  Location: GI PROCEDURES [facility];  Service: Gastroenterology    PR NDSC EVAL INTSTINAL POUCH W/BX SINGLE/MULTIPLE N/A 02/13/2022    Procedure: ENDOSCOPIC EVAL OF SMALL INTESTINAL POUCH; DIAGNOSTIC, WITH BIOPSY;  Surgeon: Bronson Curb, MD;  Location: GI PROCEDURES [facility];  Service: Gastroenterology    PR UNLISTED PROCEDURE SMALL INTESTINE  01/23/2021    Procedure: UNLISTED PROCEDURE, SMALL INTESTINE;  Surgeon: Modena Nunnery, MD;  Location: [facility] [place] [facility];  Service: Gastroenterology    PR UNLISTED PROCEDURE SMALL INTESTINE  02/13/2022    Procedure: UNLISTED PROCEDURE, SMALL INTESTINE;  Surgeon: Bronson Curb, MD;  Location: GI PROCEDURES [facility];  Service: Gastroenterology    PR UPPER GI ENDOSCOPY,BIOPSY N/A 10/27/2012    Procedure: UGI ENDOSCOPY; WITH BIOPSY, SINGLE OR MULTIPLE;  Surgeon: Shirlyn Goltz Mir, MD;  Location: PEDS PROCEDURE ROOM [facility];  Service: Gastroenterology    PR UPPER GI ENDOSCOPY,BIOPSY N/A 09/14/2013    Procedure: UGI ENDOSCOPY; WITH BIOPSY, SINGLE OR MULTIPLE;  Surgeon: Shirlyn Goltz Mir, MD;  Location: PEDS PROCEDURE ROOM [facility];  Service: Gastroenterology    PR UPPER GI ENDOSCOPY,BIOPSY N/A 11/08/2014    Procedure: UGI ENDOSCOPY; WITH BIOPSY, SINGLE OR MULTIPLE;  Surgeon: Arnold Long Mir, MD;  Location: PEDS PROCEDURE ROOM [facility];  Service: Gastroenterology    PR UPPER GI ENDOSCOPY,BIOPSY N/A 12/26/2015    Procedure: UGI ENDOSCOPY; WITH BIOPSY, SINGLE OR MULTIPLE;  Surgeon: Arnold Long Mir, MD;  Location: PEDS PROCEDURE ROOM [facility];  Service: Gastroenterology    PR UPPER GI ENDOSCOPY,BIOPSY N/A 08/27/2016    Procedure: UGI ENDOSCOPY; WITH BIOPSY, SINGLE OR MULTIPLE;  Surgeon: Arnold Long Mir, MD;  Location: PEDS PROCEDURE ROOM [facility];  Service: Gastroenterology    PR UPPER GI ENDOSCOPY,BIOPSY N/A 09/02/2017    Procedure: UGI ENDOSCOPY; WITH BIOPSY, SINGLE OR MULTIPLE;  Surgeon: Arnold Long Mir, MD;  Location: PEDS PROCEDURE ROOM [facility];  Service: Gastroenterology    PR UPPER GI ENDOSCOPY,BIOPSY N/A 03/13/2020    Procedure: UGI ENDOSCOPY; WITH BIOPSY, SINGLE OR MULTIPLE;  Surgeon: Helyn Numbers, MD;  Location: [facility] PROC[facility] [facility];  Service: Gastroenterology    PR UPPER GI ENDOSCOPY,BIOPSY N/A 09/05/2021    Procedure: UGI ENDOSCOPY; WITH BIOPSY, SINGLE OR MULTIPLE;  Surgeon: Wendall Papa, MD;  Location: GI PROCEDURES [facility];  Service: Gastroenterology    PR UPPER GI ENDOSCOPY,DIAGNOSIS N/A 01/20/2022    Procedure: UGI ENDO, INCLUDE ESOPHAGUS, STOMACH, & DUODENUM &/OR JEJUNUM; DX W/WO COLLECTION SPECIMN, BY BRUSH OR WASH;  Surgeon: Andrey Farmer, MD;  Location: GI PROCEDURES [facility];  Service: Gastroenterology    TUMOR REMOVAL      multiple-head, neck, back, hand, right flank, multiple       Medications:     Current Facility-Administered Medications:     baclofen (LIORESAL) tablet 10 mg, 10 mg, Oral, BID, Neuss, Nolon Bussing, MD PhD    bisacodyl (DULCOLAX) suppository 10 mg, 10 mg, Rectal, BID, [name] Mody, MD PhD, 10 mg at 10/07/22 1632    buprenorphine-naloxone (SUBOXONE) 2-0.5 mg SL film 0.5 mg of buprenorphine, 0.25 Film, Sublingual, TID, [name] Mody, MD PhD, 0.5 mg of buprenorphine at 10/08/22 1600    calcium carbonate (TUMS) chewable tablet 400 mg elem calcium, 400 mg elem calcium, Oral, Daily PRN, Mabry, Dana Alexandra, AGNP    [Provider Hold] cefdinir (OMNICEF) capsule 300 mg, 300 mg, Oral, Q12H SCH, Rocco Serene, MD    [Provider Hold] doxycycline (VIBRA-TABS) tablet 100 mg, 100 mg, Oral, BID, Rocco Serene, MD    famotidine (PF) (PEPCID) injection 20 mg, 20 mg, Intravenous, At bedtime, Rocco Serene, MD, 20 mg at 10/07/22 2026    fat emulsion 20 % (INTRAlipid) infusion 100 mL, 100 mL, Intravenous, Once PRN, Lobonc, Ammie Ferrier, MD  fat emulsion 20 % (INTRAlipid) infusion 200 mL, 200 mL, Intravenous, Once PRN, Lobonc, Ammie Ferrier, MD    Parenteral Nutrition (CENTRAL), , Intravenous, Continuous **AND** fat emulsion 20 % with fish oil (SMOFlipid) infusion 250 mL, 250 mL, Intravenous, Continuous, Neuss, Nolon Bussing, MD PhD    HYDROmorphone (PF) injection Syrg 0.5 mg, 0.5 mg, Intravenous, Once PRN, [name] Mody, MD PhD    LORazepam (ATIVAN) tablet 0.5 mg, 0.5 mg, Oral, TID PRN, [name] Mody, MD PhD, 0.5 mg at 10/07/22 2211    methylnaltrexone (RELISTOR) injection 8 mg, 8 mg, Subcutaneous, Daily, Neuss, Nolon Bussing, MD PhD, 8 mg at 10/08/22 1457    mirtazapine (REMERON) tablet 15 mg, 15 mg, Enteral tube: gastric, Nightly, Neuss, Nolon Bussing, MD PhD    ondansetron [facility]) injection 4 mg, 4 mg, Intravenous, Q6H PRN, Terri Piedra, AGNP, 4 mg at 10/08/22 1832    oxyCODONE (ROXICODONE) immediate release tablet 15 mg, 15 mg, Oral, Q4H PRN, [name], MD, 15 mg at 10/08/22 1715    pantoprazole (Protonix) injection 40 mg, 40 mg, Intravenous, BID, Rocco Serene, MD, 40 mg at 10/08/22 1610    Parenteral Nutrition (CENTRAL), , Intravenous, Continuous, Last Rate: 45 mL/hr at 10/07/22 2258, New Bag at 10/07/22 2258 **AND** [EXPIRED] fat emulsion 20 % with fish oil (SMOFlipid) infusion 250 mL, 250 mL, Intravenous, Continuous, Neuss, Nolon Bussing, MD PhD, Stopped at 10/08/22 1112    phenol (CHLORASEPTIC) 1.4 % spray 2 spray, 2 spray, Mucous Membrane, Q2H PRN, Rocco Serene, MD, 2 spray at 10/03/22 0138    [Provider Hold] polyethylene glycol (MIRALAX) packet 17 g, 17 g, Oral, Daily PRN, Terri Piedra, AGNP    promethazine (PHENERGAN) injection 6.25 mg, 6.25 mg, Intravenous, Q6H PRN, Rocco Serene, MD, 6.25 mg at 10/08/22 1458    sodium chloride 0.45% (1/2 NS) infusion, 100 mL/hr, Intravenous, Continuous, [name], MD, Last Rate: 100 mL/hr at 10/08/22 1837, 100 mL/hr at 10/08/22 1837    Allergies:  Allergies   Allergen Reactions    Adhesive Tape-Silicones Hives and Rash     Paper tape  And tegederm ok    Ferrlecit [Sodium Ferric Gluconat-Sucrose] Swelling and Rash    Levofloxacin Swelling and Rash     Swelling in mouth, rash,     Methylnaltrexone      Per Patient: I lost bowel control, severe abdominal cramping, and elevated BP    Neomycin Swelling     Rxn after ear drops; ear swelling    Papaya Hives    Morphine Nausea And Vomiting    Zosyn [Piperacillin-Tazobactam] Itching and Rash     Red and itchy    Compazine [Prochlorperazine] Other (See Comments)     Extreme agitation    Iron Analogues     Iron Dextran Itching     Received iron dextran 06/08/22 over 12 hours, had itching and redness/flushing during the infusion and for a couple days after. Required IV benadryl w/flares between doses and ultimately treated w/IV methylpred for 2 days.     Latex, Natural Rubber Rash       Objective:   Vital signs:   Temp:  [36.4 ??C (97.5 ??F)-36.8 ??C (98.3 ??F)] 36.7 ??C (98.1 ??F)  Heart Rate:  [64-85] 81  SpO2 Pulse:  [71-86] 80  Resp:  [16-19] 17  BP: (100-127)/(36-70) 124/64  MAP (mmHg):  [56-82] 80  SpO2:  [99 %-100 %] 99 %    Physical Exam:  Gen: has NG tube  in, overall not acutely distressed    Mental Status Exam:  Appearance:  appears stated age and has NG tube in   Attitude:   calm, cooperative   Behavior/Psychomotor:  appropriate eye contact and no abnormal movements   Speech/Language:   normal rate, not pressured, normal volume, normal fluency. normal articulation   Mood:  ???sick???   Affect:  mood congruent, dysthymic, anxious, and full range    Thought process:  logical, linear, clear, coherent, goal directed   Thought content:     Intermittent passive thoughts of death, no intent or plan of committing suicide   Perceptual disturbances:   denies auditory and visual hallucinations and behavior not concerning for response to internal stimuli   Attention:  able to attend to interview without fluctuations in consciousness   Concentration:  Able to fully concentrate and attend   Orientation:  grossly oriented.   Memory:  not formally tested, but grossly intact   Fund of knowledge:   not formally assessed   Insight:    Fair   Judgment:   Fair   Impulse Control:  Fair     Relevant laboratory/imaging data was reviewed.    Additional Psychometric Testing:  Not applicable.    Consult Type and Time-Based Documentation:  This patient was evaluated in person.    Time-based billing disclaimer:  I personally spent 75   minutes face-to-face and non-face-to-face in the care of this patient, which includes all pre, intra, and post visit time on the date of service.  All documented time was specific to the E/M visit and does not include any procedures that may have been performed.

## 2022-10-08 NOTE — Unmapped (Signed)
Adoles[facility] Young [facility] Program Visit     Service date: October 07, 2022    Encounter location: Inpatient - Adult     Clinician: [name], LCSW - AYA Clinical Social Worker     Patient identifiers: [name] [name] is a 22 y.o. with desmoid tumors and [name]. [name] [name] uses she/her pronouns.      Additional visit participants: pt's mother    Visit Summary     Received call from [facility] requesting help advocating for a psychiatry consult, which we had discussed last week given worsening anxiety and depression, and limited recent outpatient psychiatry follow up d/t admissions and one missed appointment. Had discussed with her outpatient psychiatrist Dr. Malcolm Metro, who agreed with consult, and had requested consult from primary inpatient team on Friday. Dr. Malcolm Metro had suggested that inpatient psychiatry could trial Wellbutrin or another non-SSRI agent, given past bleeding on an SSRI.    Spoke with Dr. Charlies Constable outside patient room, who provided updates on communication challenges.    Spoke with [name] [name] and her mom and facilitated anxiety-reduction exercise with them both. Her mom left room to get food; encouraged caregiver self-care and later offered connection to caregiver supports including counseling.    [name] [name] shared her perspective on challenges this week, primarily centered around communication about care plan. She initially described feeling upset and frustrated, but upon further exploration identified primary feelings of fear and helplessness in setting of physical weakness/vulnerability and traumatic medical experience early in this admission. Specifically, she continues to reference episode of severe SOB during a rapid response on 7/19, in which transfer to MICU was considered, and Kani experienced acute worry about her survival. Though she has experienced increasing medical complexity and vulnerability in recent years and has received intensive medical intervention throughout her life, she describes this experience on 7/19 as the first time she was afraid she might die, and describes this admission as the most frightening admission she has had. Unfortunately, ongoing medical complications and symptoms during this admission, along with communication challenges with the inpatient provider, are contributing to worsening anxiety.    Worked closely with [name] [name] and her provider to facilitate a plan of care moving forward.    Follow Up/Plan:     Will see tomorrow during rounds with inpatient team to facilitate care.    I will continue to follow this patient for AYA-appropriate support. They have my contact information and have been encouraged to contact me as needed.     Flowsheet     AYA Assessment  Primary Caregiver: Parent  Location Seen: Inpatient  Contact Point: During treatment  Referral Source: AYA team  Issues Discussed: Treatment information, Symptom management, Mental Health, Healthcare system barriers, Concerns with care or care team, Physical Activity  AYA Team Interventions: Treatment-specific information, Psychoeducation, Goals of care support and communication, Supportive counseling, Care coordination, Coordination with primary oncology team, Adapting care to AYA needs  Referrals Made: Psychiatry  Follow Up: Other (Comment) (Next day)  Time Spent (in minutes): 270    10/08/2022     [name], LCSW  Adolescent and Young Adult Clinical Social Worker

## 2022-10-08 NOTE — Unmapped (Signed)
Department of Anesthesiology  Pain Medicine Division    Chronic Pain Followup Inpatient Consult Note    Requesting Attending Physician:  Lambert Mody, MD PhD  Service Requesting Consult:  [facility])    Assessment/Recommendations:    The patient was seen in consultation on request of Rocco Serene, MD regarding assistance with pain management for patient's chronic intractable abdominal pain with now acute on chronic recurrent ileus/NG tube placement.  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      Patient currently established with [facility] pain management center Dr. Dyann Ruddle, has been on oxycodone 10 mg 3 times daily, Suboxone 0.5 mg 3 times daily along with baclofen and pregabalin. Overall goal is to wean to buprenorphine and non-opioid medications and eventually off of buprenorphine as well. Through her psychiatrist she is on Ativan 0.5 mg 3 times daily as needed. She has contracted to avoid taking the ativan at the same time or close to when she takes her opioid pain medications.     Patient has had multiple prior inpatient stays either regarding acute exacerbation of chronic pain or ileus.  In the past 12 months she has had 2 infusions of ketamine with limited benefit (last infusion in May 2024) and at one point there was some transaminitis which was thought to be attributed from other etiologies like TPN versus other intra-abdominal pathologies.  In the past patient also has had lidocaine infusions during inpatient stay with limited/variable benefit.        Regarding ileus and her constipation, After discussion with the patient and mother we came to an agreement to attempt with a low-dose of naloxegol 12.5 mg, she has taken enterag around surgery time before and seemed to tolerate it. GI team recommended against neostigmine at this time. We expect the naloxegol to increase her abdominal pain at first but hope that we can continue it and even increase to 25mg  if able.     This is no doubt a very complex situation. Her biggest issue at this point seems to be the ileus. Medication management should focus on non-opioid medications, although she has trialed others in past with side effects or minimal benefit.  Hopefully minimizing opioids and working on her ileus will help get her out of the hospital quicker than previously.  Our goal while admitted would be for her to be able to manage her pain without the need for IV opioids and instead take oxycodone up to 15 mg 4 times daily as needed, with the goal of trying to get as close to oxycodone 10 mg 3 times daily as needed as possible.    Her Suboxone is currently increased from her home dose in an attempt to provide optimal basal control of her pain, favoring an increase in buprenorphine which is a partial mu opioid agonist over use of full mu opioid agonists, which can have worse GI side effect profiles.  Again, ultimately the goal would be to wean off of oxy and manage her pain with buprenorphine alone.       Interval: [name] appears to be largely unchanged from yesterday in terms of her overall symptomatology.  She continues to express frustration and demoralization about her current state as well as confusion about her overall condition.  At this time, it appears some of her symptomatology can be due to malnutrition and deconditioning.  Additionally, the  patient's current headache picture appears most consistent with a tension type headache and we discussed that baclofen is likely to help with this.  We also discussed that while her initial headache may have been due to either hormonal imbalances as a sequela of her egg retrieval or headache due to a side effect from her chemotherapy, her current headache is likely rebound/medication overuse headache.  Patient was amenable to an IV Tylenol holiday and to avoiding IV Benadryl. We discussed that Fioricet could be a possible treatment option should medication holidays fail.  Patient was amenable to avoiding IV hydromorphone and minimizing both oxycodone and Ativan.      Recommendations:  -The chronic pain service is a consult service and does not place orders, just makes recommendations (except ketamine and lidocaine infusions)   -Please evaluate all patients on opioids for appropriateness of prescribing narcan at discharge.  The chronic pain service can assist with this.  Nasal narcan is covered by most insurances.  -Recommendations given apply to the current hospitalization and do not reflect long term recommendations.    - Discontinued lidocaine infusion at scheduled time.  09/28/22 - 10/01/22  - Patient amenable to 24-48h IV Tylenol holiday given concern for medication overuse headache  - Pt will continue to try to avoid IV HM and minimize oxycodone and PRN Ativan use  - Pt agreed to trial a holiday of IV Benadryl  - If headaches are not improved from IV Tylenol holiday, consider Fioricet once daily as needed for headaches  - Agree with GI recs of naloxegol 12.5 mg p.o. daily.  GI following   - Baclofen 5mg  twice daily per NG tube  - Agree with primary team pain and bowel regimen   - Recommend continued physical therapy to work on bed level mobility   - Would recommend speech therapy consult to work on oral desensitization as she has not been able to tolerate anyone in her mouth such as ice chips or small sips of water  - Recommend involvement of inpatient psychology and complex care given patient's distress surrounding hospitalization      We will continue to follow.    Naloxone Rx at discharge?  Is patient on opioids? Yes.  1)Is dose >50MME?  Yes.  2) Is patient prescribed a benzodiazepine (w opioids)? Yes.  3)Hx of overdose?  No.  4) Hx of substance use disorder? No.  5) Opioids likely to last greater than a week after discharge? Yes.       If yes to 2 or more, prescribe naloxone at discharge.  Nasal narcan for most insured (Nasal narcan 4mg /actuation, prescribe 1 kit, instructions at [email or web address]).  For uninsured, chronic pain can work to assist in finding an option.  OTC nasal narcan now available at most pharmacies for around $45.    Interim History  There were no Acute Events Overnight.  The patient is not obtaining adequate pain relief on current medication regimen and feels that their pain is Somewhat controlled    Today patient reports pain is largely unchanged but notes she is still suffering from her headache that has been severe at times despite stopping IV HM.    Analgesia Evaluation:  Pain at minimum: 0/10  Pain at maximum: 9/10    Current pain medication regimen (including how frequent PRN's were used):  Suboxone 2-0.5mg  TID   Lidocaine infusion 1mg /kg/hr 72 hours   Tylenol IV   Baclofen 10mg  TID on hold   Robaxin IV 250mg  TID   Oxycodone 15mg   q4h PRN (on hold)     Inpatient Medications  Current Facility-Administered Medications   Medication Dose Route Frequency Provider Last Rate Last Admin    baclofen (LIORESAL) tablet 5 mg  5 mg Oral BID Lambert Mody, MD PhD   5 mg at 10/07/22 2009    bisacodyl (DULCOLAX) suppository 10 mg  10 mg Rectal BID Lambert Mody, MD PhD   10 mg at 10/07/22 1632    buprenorphine-naloxone (SUBOXONE) 2-0.5 mg SL film 0.5 mg of buprenorphine  0.25 Film Sublingual TID Lambert Mody, MD PhD   0.5 mg of buprenorphine at 10/07/22 2314    calcium carbonate (TUMS) chewable tablet 400 mg elem calcium  400 mg elem calcium Oral Daily PRN Terri Piedra, AGNP        [Provider Hold] cefdinir (OMNICEF) capsule 300 mg  300 mg Oral Q12H [facility] Rocco Serene, MD        [Provider Hold] doxycycline (VIBRA-TABS) tablet 100 mg  100 mg Oral BID Rocco Serene, MD        famotidine (PF) (PEPCID) injection 20 mg  20 mg Intravenous At bedtime Rocco Serene, MD   20 mg at 10/07/22 2026    fat emulsion 20 % (INTRAlipid) infusion 100 mL  100 mL Intravenous Once PRN Lobonc, Ammie Ferrier, MD        fat emulsion 20 % (INTRAlipid) infusion 200 mL  200 mL Intravenous Once PRN Lobonc, Ammie Ferrier, MD        Parenteral Nutrition (CENTRAL)   Intravenous Continuous Lambert Mody, MD PhD 45 mL/hr at 10/07/22 2258 New Bag at 10/07/22 2258    And    fat emulsion 20 % with fish oil (SMOFlipid) infusion 250 mL  250 mL Intravenous Continuous Lambert Mody, MD PhD 20.8 mL/hr at 10/07/22 2259 250 mL at 10/07/22 2259    LORazepam (ATIVAN) tablet 0.5 mg  0.5 mg Oral TID PRN Lambert Mody, MD PhD   0.5 mg at 10/07/22 2211    [Provider Hold] mirtazapine (REMERON) tablet 15 mg  15 mg Oral Nightly Terri Piedra, AGNP   15 mg at 09/26/22 2316    naloxegol (MOVANTIK) tablet 25 mg  25 mg Oral Daily Lambert Mody, MD PhD        ondansetron [facility]) injection 4 mg  4 mg Intravenous Q6H PRN Terri Piedra, AGNP   4 mg at 10/07/22 2214    oxyCODONE (ROXICODONE) immediate release tablet 15 mg  15 mg Oral Q4H PRN [name], MD   15 mg at 10/07/22 2358    pantoprazole (Protonix) injection 40 mg  40 mg Intravenous BID Rocco Serene, MD   40 mg at 10/07/22 2025    phenol (CHLORASEPTIC) 1.4 % spray 2 spray  2 spray Mucous Membrane Q2H PRN Rocco Serene, MD   2 spray at 10/03/22 0138    [Provider Hold] polyethylene glycol (MIRALAX) packet 17 g  17 g Oral Daily PRN Terri Piedra, AGNP        promethazine (PHENERGAN) injection 6.25 mg  6.25 mg Intravenous Q6H PRN Rocco Serene, MD   6.25 mg at 10/08/22 0153    sodium chloride 0.45% (1/2 NS) infusion  100 mL/hr Intravenous Continuous [name], MD 100 mL/hr at 10/07/22 2225 100 mL/hr at 10/07/22 2225         Objective:     Vital Signs    Temp:  [36.5 ??C (97.7 ??  F)-37.1 ??C (98.8 ??F)] 36.5 ??C (97.7 ??F)  Heart Rate:  [62-82] 64  SpO2 Pulse:  [62-82] 77  Resp:  [9-20] 18  BP: (106-127)/(37-74) 115/58  MAP (mmHg):  [56-86] 56  SpO2:  [99 %-100 %] 100 %      Physical Exam     GENERAL:  Well developed, well-nourished female and looking uncomfortable from pain and NG tube discomfort. Again, initially asleep in bed with blankets over her head but easily woken up and converses casually   HEAD/NECK:    Reveals normocephalic/atraumatic.   CARDIOVASCULAR:   WWP, regular tate  LUNGS:   Normal work of breathing, Kirkpatrick  EXTREMITIES:  Warm, no clubbing, cyanosis, or edema was noted.  NEUROLOGIC:    The patient was alert and oriented times four with normal language, attention, cognition and memory. Cranial nerve exam was grossly normal.    MUSCULOSKELETAL:    SKIN:  No obvious rashes lesions or erythema  PSY:  Appropriate affect and mood.    Test Results    Lab Results   Component Value Date    CREATININE 0.46 (L) 10/07/2022     Lab Results   Component Value Date    ALKPHOS 72 10/05/2022    BILITOT 0.2 (L) 10/05/2022    BILIDIR <0.10 08/08/2022    PROT 5.8 10/05/2022    ALBUMIN 3.1 (L) 10/05/2022    ALT 20 10/05/2022    AST 20 10/05/2022           Problem List    Principal Problem:    Other acute postprocedural pain  Active Problems:    [name]    Intestinal polyps    [name]    Neoplasm related pain    Physical deconditioning    History of colectomy    Intractable abdominal pain    Generalized anxiety disorder with panic attacks    SBO (small bowel obstruction) (CMS-HCC)    Small intestinal bacterial overgrowth (SIBO)      Nat Christen, MD  Assistant Professor of Anesthesiology  Department of Anesthesiology   Divisions of General Anesthesiology and Pain Medicine     Medical Decision Making    Problems Addressed:  1 or more chronic illnesses with severe exacerbation, progression, or side effects of treatment (high)    Amount/Complexity:  Reviewed external records: Primary team progress note  Reviewed results: 7/30 CBC, 7/30 BMP  Ordered tests: none  Assessment requiring independent historian: patient's mother  Discussion of management/test results with medical professional: Primary team  Independent interpretation of test: none    Risk  High: Drug therapy requiring intensive monitoring for toxicity, Decision regarding hospitalization, Elective Major Surgery w/ risk factors, Emergency Major Surgery, Decision to DNR or de-escalate care because of poor prognosis, parental controlled substances (IV opioids)

## 2022-10-08 NOTE — Unmapped (Signed)
Department of Anesthesiology  Pain Medicine Division    Chronic Pain Followup Inpatient Consult Note    Requesting Attending Physician:  Lambert Mody, MD PhD  Service Requesting Consult:  [facility])    Assessment/Recommendations:    The patient was seen in consultation on request of Rocco Serene, MD regarding assistance with pain management for patient's chronic intractable abdominal pain with now acute on chronic recurrent ileus/NG tube placement.  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      Patient currently established with [facility] pain management center Dr. Dyann Ruddle, has been on oxycodone 10 mg 3 times daily, Suboxone 0.5 mg 3 times daily along with baclofen and pregabalin. Overall goal is to wean to buprenorphine and non-opioid medications and eventually off of buprenorphine as well. Through her psychiatrist she is on Ativan 0.5 mg 3 times daily as needed. She has contracted to avoid taking the ativan at the same time or close to when she takes her opioid pain medications.     Patient has had multiple prior inpatient stays either regarding acute exacerbation of chronic pain or ileus.  In the past 12 months she has had 2 infusions of ketamine with limited benefit (last infusion in May 2024) and at one point there was some transaminitis which was thought to be attributed from other etiologies like TPN versus other intra-abdominal pathologies.  In the past patient also has had lidocaine infusions during inpatient stay with limited/variable benefit.        Regarding ileus and her constipation, After discussion with the patient and mother we came to an agreement to attempt with a low-dose of naloxegol 12.5 mg, she has taken enterag around surgery time before and seemed to tolerate it. GI team recommended against neostigmine at this time. We expect the naloxegol to increase her abdominal pain at first but hope that we can continue it and even increase to 25mg  if able.     This is no doubt a very complex situation. Her biggest issue at this point seems to be the ileus. Medication management should focus on non-opioid medications, although she has trialed others in past with side effects or minimal benefit.  Hopefully minimizing opioids and working on her ileus will help get her out of the hospital quicker than previously.  Our goal while admitted would be for her to be able to manage her pain without the need for IV opioids and instead take oxycodone up to 15 mg 4 times daily as needed, with the goal of trying to get as close to oxycodone 10 mg 3 times daily as needed as possible.    Her Suboxone is currently increased from her home dose in an attempt to provide optimal basal control of her pain, favoring an increase in buprenorphine which is a partial mu opioid agonist over use of full mu opioid agonists, which can have worse GI side effect profiles.  Again, ultimately the goal would be to wean off of oxy and manage her pain with buprenorphine alone.     Has had lidocaine gtt this admission with most help. Has been avoiding IV HM with success. Has been having HA's c/w tension type headaches with likely rebound/medication over use component.     Interval: [name] [name] was sitting up at the side of the bed working with PT this morning.  Though she states that she is  largely unchanged from an overall pain perspective and from a headache perspective, she did appear more comfortable this morning and was more active than in days past.  She notes that the baclofen may be helping but it may be too early to tell as she only had 1 dose.  She has not had severe worsening of her overall pain since initiating her Tylenol holiday but is discouraged by the continued presence of her headache.  Provided encouragement and noted that there are backup options including Fioricet should her headaches persist.  Additionally, patient notes a small bowel movement overnight in response to increase Movantik and suppository and encouraged her that this is a promising sign.  Finally, patient endorses scratchy throat, this could be due to irritation from the NG tube versus developing URI.  Has been using her I-S to good effect given presence of a cough.     Recommendations:  -The chronic pain service is a consult service and does not place orders, just makes recommendations (except ketamine and lidocaine infusions)   -Please evaluate all patients on opioids for appropriateness of prescribing narcan at discharge.  The chronic pain service can assist with this.  Nasal narcan is covered by most insurances.  -Recommendations given apply to the current hospitalization and do not reflect long term recommendations.    - Discontinued lidocaine infusion at scheduled time.  09/28/22 - 10/01/22  - Patient amenable to 24-48h IV Tylenol holiday given concern for medication overuse headache, no benefit as of now  - Pt will continue to try to avoid IV HM and minimize oxycodone and PRN Ativan use  - Pt agreed to trial a holiday of IV Benadryl  - If headaches are not improved from IV Tylenol holiday, consider Fioricet once daily as needed for headaches  - Agree with GI recs of naloxegol 12.5 mg p.o. daily.  GI following   - Increase Baclofen to 10 mg twice daily per NG tube  - Agree with primary team pain and bowel regimen   - Recommend continued physical therapy to work on bed level mobility   - Would recommend speech therapy consult to work on oral desensitization as she has not been able to tolerate anyone in her mouth such as ice chips or small sips of water  - Recommend involvement of inpatient psychology and complex care given patient's distress surrounding hospitalization      We will continue to follow.    Naloxone Rx at discharge?  Is patient on opioids? Yes.  1)Is dose >50MME?  Yes.  2) Is patient prescribed a benzodiazepine (w opioids)? Yes.  3)Hx of overdose?  No.  4) Hx of substance use disorder? No.  5) Opioids likely to last greater than a week after discharge? Yes.       If yes to 2 or more, prescribe naloxone at discharge.  Nasal narcan for most insured (Nasal narcan 4mg /actuation, prescribe 1 kit, instructions at [email or web address]).  For uninsured, chronic pain can work to assist in finding an option.  OTC nasal narcan now available at most pharmacies for around $45.    Interim History  There were no Acute Events Overnight.  The patient is not obtaining adequate pain relief on current medication regimen and feels that their pain is Somewhat controlled    Today patient reports pain is largely unchanged, including headache, but notes BP has been better. Hurt shoulder adjusting in bed overnight but improved this AM    Analgesia Evaluation:  Pain at minimum: 0/10  Pain at maximum: 8/10    Current pain medication regimen (including how frequent PRN's were used):  Suboxone 2-0.5mg  TID   Tylenol IV   Baclofen 5 mg DID   Oxycodone 15mg  q4h PRN (x4)    Inpatient Medications  Current Facility-Administered Medications   Medication Dose Route Frequency Provider Last Rate Last Admin    baclofen (LIORESAL) tablet 5 mg  5 mg Oral BID Lambert Mody, MD PhD   5 mg at 10/08/22 1610    bisacodyl (DULCOLAX) suppository 10 mg  10 mg Rectal BID Lambert Mody, MD PhD   10 mg at 10/07/22 1632    buprenorphine-naloxone (SUBOXONE) 2-0.5 mg SL film 0.5 mg of buprenorphine  0.25 Film Sublingual TID Lambert Mody, MD PhD   0.5 mg of buprenorphine at 10/08/22 0844    calcium carbonate (TUMS) chewable tablet 400 mg elem calcium  400 mg elem calcium Oral Daily PRN Terri Piedra, AGNP        [Provider Hold] cefdinir (OMNICEF) capsule 300 mg  300 mg Oral Q12H [facility] Rocco Serene, MD        [Provider Hold] doxycycline (VIBRA-TABS) tablet 100 mg  100 mg Oral BID Rocco Serene, MD        famotidine (PF) (PEPCID) injection 20 mg  20 mg Intravenous At bedtime Rocco Serene, MD   20 mg at 10/07/22 2026    fat emulsion 20 % (INTRAlipid) infusion 100 mL  100 mL Intravenous Once PRN Lobonc, Ammie Ferrier, MD        fat emulsion 20 % (INTRAlipid) infusion 200 mL  200 mL Intravenous Once PRN Lobonc, Ammie Ferrier, MD        Parenteral Nutrition (CENTRAL)   Intravenous Continuous Lambert Mody, MD PhD        And    fat emulsion 20 % with fish oil (SMOFlipid) infusion 250 mL  250 mL Intravenous Continuous Lambert Mody, MD PhD        HYDROmorphone (PF) injection Syrg 0.5 mg  0.5 mg Intravenous Once PRN Lambert Mody, MD PhD        LORazepam (ATIVAN) tablet 0.5 mg  0.5 mg Oral TID PRN Lambert Mody, MD PhD   0.5 mg at 10/07/22 2211    methylnaltrexone (RELISTOR) injection 8 mg  8 mg Subcutaneous Daily Lambert Mody, MD PhD        [Provider Hold] mirtazapine (REMERON) tablet 15 mg  15 mg Oral Nightly Terri Piedra, AGNP   15 mg at 09/26/22 2316    ondansetron (ZOFRAN) injection 4 mg  4 mg Intravenous Q6H PRN Terri Piedra, AGNP   4 mg at 10/08/22 1227    oxyCODONE (ROXICODONE) immediate release tablet 15 mg  15 mg Oral Q4H PRN [name], MD   15 mg at 10/08/22 1227    pantoprazole (Protonix) injection 40 mg  40 mg Intravenous BID Rocco Serene, MD   40 mg at 10/08/22 9604    Parenteral Nutrition (CENTRAL)   Intravenous Continuous Lambert Mody, MD PhD 45 mL/hr at 10/07/22 2258 New Bag at 10/07/22 2258    phenol (CHLORASEPTIC) 1.4 % spray 2 spray  2 spray Mucous Membrane Q2H PRN Rocco Serene, MD   2 spray at 10/03/22 0138    [Provider Hold] polyethylene glycol (MIRALAX) packet 17 g  17 g Oral Daily PRN Terri Piedra, AGNP        promethazine (PHENERGAN) injection  6.25 mg  6.25 mg Intravenous Q6H PRN Rocco Serene, MD   6.25 mg at 10/08/22 0825    sodium chloride 0.45% (1/2 NS) infusion  100 mL/hr Intravenous Continuous [name], MD 100 mL/hr at 10/07/22 2225 100 mL/hr at 10/07/22 2225         Objective:     Vital Signs    Temp:  [36.4 ??C (97.5 ??F)-37.1 ??C (98.8 ??F)] 36.4 ??C (97.5 ??F)  Heart Rate:  [64-85] 85  SpO2 Pulse:  [71-86] 86  Resp:  [16-20] 18  BP: (100-127)/(36-66) 100/55  MAP (mmHg):  [56-82] 70  SpO2:  [100 %] 100 %      Physical Exam     GENERAL:  Well developed, well-nourished female and looking uncomfortable from pain and NG tube discomfort. Sitting up at side of bed with PT, appearing more comfortable and less tired  HEAD/NECK:    Reveals normocephalic/atraumatic. NGT present, hoarse voice  CARDIOVASCULAR:   WWP, regular tate  LUNGS:   Normal work of breathing, [place]  EXTREMITIES:  Warm, no clubbing, cyanosis, or edema was noted.  NEUROLOGIC:    The patient was alert and oriented times four with normal language, attention, cognition and memory. Cranial nerve exam was grossly normal.    MUSCULOSKELETAL:    SKIN:  No obvious rashes lesions or erythema  PSY:  Appropriate affect and mood.    Test Results    Lab Results   Component Value Date    CREATININE 0.42 (L) 10/08/2022     Lab Results   Component Value Date    ALKPHOS 72 10/05/2022    BILITOT 0.2 (L) 10/05/2022    BILIDIR <0.10 08/08/2022    PROT 5.8 10/05/2022    ALBUMIN 3.1 (L) 10/05/2022    ALT 20 10/05/2022    AST 20 10/05/2022           Problem List    Principal Problem:    Other acute postprocedural pain  Active Problems:    [name]    Intestinal polyps    [name]    Neoplasm related pain    Physical deconditioning    History of colectomy    Intractable abdominal pain    Generalized anxiety disorder with panic attacks    SBO (small bowel obstruction) (CMS-HCC)    Small intestinal bacterial overgrowth (SIBO)      Nat Christen, MD  Assistant Professor of Anesthesiology  Department of Anesthesiology   Divisions of General Anesthesiology and Pain Medicine     Medical Decision Making    Problems Addressed:  1 or more chronic illnesses with severe exacerbation, progression, or side effects of treatment (high)    Amount/Complexity:  Reviewed external records: Primary team progress note  Reviewed results: 7/31 BMP  Ordered tests: none  Assessment requiring independent historian: patient's mother  Discussion of management/test results with medical professional: Primary team  Independent interpretation of test: none    Risk  Moderate: Prescription Drug Management, Minor surgery w/ risk factors, Elective Major Surgery w/o risk factors, Diagnosis/treatment significantly limited by social determinants of health

## 2022-10-09 LAB — PHOSPHORUS
PHOSPHORUS: 4.8 mg/dL (ref 2.4–5.1)
PHOSPHORUS: 4.5 mg/dL (ref 2.4–5.1)

## 2022-10-09 LAB — BASIC METABOLIC PANEL
ANION GAP: 8 mmol/L (ref 5–14)
ANION GAP: 5 mmol/L (ref 5–14)
BLOOD UREA NITROGEN: 10 mg/dL (ref 9–23)
BLOOD UREA NITROGEN: 13 mg/dL (ref 9–23)
BUN / CREAT RATIO: 36
BUN / CREAT RATIO: 30
CALCIUM: 8.5 mg/dL — ABNORMAL LOW (ref 8.7–10.4)
CALCIUM: 9 mg/dL (ref 8.7–10.4)
CHLORIDE: 101 mmol/L (ref 98–107)
CHLORIDE: 109 mmol/L — ABNORMAL HIGH (ref 98–107)
CO2: 26 mmol/L (ref 20.0–31.0)
CO2: 30 mmol/L (ref 20.0–31.0)
CREATININE: 0.28 mg/dL — ABNORMAL LOW
CREATININE: 0.43 mg/dL — ABNORMAL LOW
EGFR CKD-EPI (2021) FEMALE: >90 mL/min/{1.73_m2} (ref >=60)
EGFR CKD-EPI (2021) FEMALE: >90 mL/min/{1.73_m2} (ref >=60)
GLUCOSE RANDOM: 572 mg/dL (ref 70–179)
GLUCOSE RANDOM: 111 mg/dL (ref 70–179)
POTASSIUM: 6.4 mmol/L (ref 3.4–4.8)
POTASSIUM: 3.7 mmol/L (ref 3.4–4.8)
SODIUM: 135 mmol/L (ref 135–145)
SODIUM: 144 mmol/L (ref 135–145)

## 2022-10-09 LAB — PREGNANCY, URINE: PREGNANCY TEST URINE: NEGATIVE

## 2022-10-09 LAB — CBC
HEMATOCRIT: 29.1 % — ABNORMAL LOW (ref 34.0–44.0)
HEMOGLOBIN: 9.6 g/dL — ABNORMAL LOW (ref 11.3–14.9)
MEAN CORPUSCULAR HEMOGLOBIN CONC: 33.2 g/dL (ref 32.0–36.0)
MEAN CORPUSCULAR HEMOGLOBIN: 26.5 pg (ref 25.9–32.4)
MEAN CORPUSCULAR VOLUME: 79.9 fL (ref 77.6–95.7)
MEAN PLATELET VOLUME: 10.6 fL (ref 6.8–10.7)
PLATELET COUNT: 181 10*9/L (ref 150–450)
RED BLOOD CELL COUNT: 3.64 10*12/L — ABNORMAL LOW (ref 3.95–5.13)
RED CELL DISTRIBUTION WIDTH: 13 % (ref 12.2–15.2)
WBC ADJUSTED: 5.1 10*9/L (ref 3.6–11.2)

## 2022-10-09 LAB — MAGNESIUM
MAGNESIUM: 2.4 mg/dL (ref 1.6–2.6)
MAGNESIUM: 2 mg/dL (ref 1.6–2.6)

## 2022-10-09 MED ADMIN — pantoprazole (Protonix) injection 40 mg: 40 mg | INTRAVENOUS | @ 13:00:00

## 2022-10-09 MED ADMIN — sodium chloride 0.45% (1/2 NS) infusion: 100 mL/h | INTRAVENOUS | @ 13:00:00

## 2022-10-09 MED ADMIN — mirtazapine (REMERON) tablet 15 mg: 15 mg | GASTROENTERAL | @ 01:00:00 | NDC 78206016001

## 2022-10-09 MED ADMIN — buprenorphine-naloxone (SUBOXONE) 2-0.5 mg SL film 0.5 mg of buprenorphine: .25 | SUBLINGUAL | @ 13:00:00

## 2022-10-09 MED ADMIN — baclofen (LIORESAL) tablet 10 mg: 10 mg | ORAL | @ 13:00:00

## 2022-10-09 MED ADMIN — sodium chloride 0.45% (1/2 NS) infusion: 100 mL/h | INTRAVENOUS | @ 09:00:00

## 2022-10-09 MED ADMIN — baclofen (LIORESAL) tablet 10 mg: 10 mg | ORAL | @ 01:00:00 | NDC 73320000204

## 2022-10-09 MED ADMIN — buprenorphine-naloxone (SUBOXONE) 2-0.5 mg SL film 0.5 mg of buprenorphine: .25 | SUBLINGUAL | @ 19:00:00

## 2022-10-09 MED ADMIN — promethazine (PHENERGAN) 6.5 mg in sodium chloride (NS) 0.9 % 50 mL IVPB: 6.5 mg | INTRAVENOUS | @ 12:00:00

## 2022-10-09 MED ADMIN — buprenorphine-naloxone (SUBOXONE) 2-0.5 mg SL film 0.5 mg of buprenorphine: .25 | SUBLINGUAL | @ 01:00:00 | NDC 52427069211

## 2022-10-09 MED ADMIN — promethazine (PHENERGAN) 6.5 mg in sodium chloride (NS) 0.9 % 50 mL IVPB: 6.5 mg | INTRAVENOUS | @ 02:00:00 | NDC 60977000243

## 2022-10-09 MED ADMIN — HYDROmorphone (PF) injection Syrg 0.5 mg: .5 mg | INTRAVENOUS | @ 22:00:00 | Stop: 2022-10-23

## 2022-10-09 MED ADMIN — Parenteral Nutrition (CENTRAL): INTRAVENOUS | @ 03:00:00 | Stop: 2022-10-09 | NDC 96295013582

## 2022-10-09 MED ADMIN — LORazepam (ATIVAN) tablet 0.5 mg: .5 mg | ORAL | @ 03:00:00 | NDC 76420064790

## 2022-10-09 MED ADMIN — LORazepam (ATIVAN) tablet 0.5 mg: .5 mg | ORAL | @ 18:00:00

## 2022-10-09 MED ADMIN — promethazine (PHENERGAN) 6.5 mg in sodium chloride (NS) 0.9 % 50 mL IVPB: 6.5 mg | INTRAVENOUS | @ 22:00:00

## 2022-10-09 MED ADMIN — HYDROmorphone (PF) injection Syrg 0.5 mg: .5 mg | INTRAVENOUS | @ 01:00:00 | Stop: 2022-10-08 | NDC 00409426411

## 2022-10-09 MED ADMIN — oxyCODONE (ROXICODONE) immediate release tablet 15 mg: 15 mg | ORAL | @ 12:00:00 | Stop: 2022-10-09

## 2022-10-09 MED ADMIN — methylnaltrexone (RELISTOR) injection 8 mg: 8 mg | SUBCUTANEOUS | @ 15:00:00

## 2022-10-09 MED ADMIN — famotidine (PF) (PEPCID) injection 20 mg: 20 mg | INTRAVENOUS | @ 01:00:00 | NDC 96619043921

## 2022-10-09 MED ADMIN — pantoprazole (Protonix) injection 40 mg: 40 mg | INTRAVENOUS | @ 01:00:00 | NDC 82009001190

## 2022-10-09 MED ADMIN — bisacodyl (DULCOLAX) suppository 10 mg: 10 mg | RECTAL | @ 19:00:00

## 2022-10-09 MED ADMIN — fat emulsion 20 % with fish oil (SMOFlipid) infusion 250 mL: 250 mL | INTRAVENOUS | @ 03:00:00 | Stop: 2022-10-09 | NDC 63323082074

## 2022-10-09 NOTE — Unmapped (Signed)
Daily Progress Note    Assessment/Plan:    Principal Problem:    Other acute postprocedural pain  Active Problems:    [name]    Intestinal polyps    [name]    Neoplasm related pain    Physical deconditioning    History of colectomy    Intractable abdominal pain    Generalized anxiety disorder with panic attacks    SBO (small bowel obstruction) (CMS-HCC)    Small intestinal bacterial overgrowth (SIBO)  Resolved Problems:    * No resolved hospital problems. 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    #. Ileus, multifactorial but probably related to opiates:  #. Inability to tolerate PO, without diagnosis of malnutrition:  - continue TPN for nutritional support  - HOLD naloxegol --> trial relistor starting 7/31 in case absorption of naloxegol or ongoing use of NG tube could be limiting effect  - continue discussion around readiness for removal of NGT; as I conveyed to the patient, I worry about duration of placement of NGT and potential for complications if not eventually addressed   - continue bowel regimen, needs extensive regimen including twice daily enema or suppository  - limit opiates, noting however she takes chronically     #. Headache, now daily (chronic?):  - trial of IVF, magnesium --> not much different with this 7/28  - she declined compazine, had trial of benadryl --> not helpful and would avoid IV benadryl moving forward (has been discussed with patient by me and [name])  - trial OFF apap in case of rebound/cyclical headache (last APAP = 7/29) --> if unsuccessful, trial other meds noting limitations from prior intolerances to meds     #. Syncope, uncertain etiology:  - consider vasovagal, but either exacerbated (if not caused) by medication effects   - adjust benzos, opiates, other potentially vasoactive meds as tolerated   - broaden eval if recurrent, noting this has been issue previously during hospitalization     #. Muscle spasms and diffuse pain, uncertain etiology:  - occurred post-procedurally, with reassuring evaluation (MRI spine, neuro c/s, CK normal)  - transition back to baclofen, increase 7/31 to 10mg  BID   - ativan prn (PTA med), but caution against escalating (noting risks of concurrent benzos + opiates, possibility of contribution to syncope and other symptoms)  - oxycodone for severe pain, with caution given narcotic bowel   - follow chronic pain recs    #. Coffee-ground emesis:  - noted in NGT 7/21 and again 7/31--> have again discussed risk of trauma and potential complications from NG tube and timing of removal  - would probably favor removal and transition to NPO with *very* slow increase in oral intake given intolerance, but favoring time without NG in place and potential progression to some oral nutrition  - continue IV PPI twice daily    Chronic/stable/resolved problems:  #. [name]: hold nirogacestat   #. SIBO: hold home cefdinir, doxy  #. Desmoid tumors: resume nirogacestat as outpatient, follow with GI, VIR for steroid injections     ___________________________________________________________________    Subjective:  -  in the chair today  - accompanied by [name]  - discussed symptoms, goals, plan of care  - she had episode of apparently coffee-ground emesis in tube, small but uncertain amount  - none since  - feels more distended today  - headache is without change   - feels pain is ongoing, not controlled  - agreeable to trial of relistor       Recent Results (from the past 24 hour(s))   Basic Metabolic Panel    Collection Time: 10/08/22  6:25 AM Result Value Ref Range    Sodium 142 135 - 145 mmol/L    Potassium 3.7 3.4 - 4.8 mmol/L    Chloride 108 (H) 98 - 107 mmol/L    CO2 28.0 20.0 - 31.0 mmol/L    Anion Gap 6 5 - 14 mmol/L    BUN 12 9 - 23 mg/dL    Creatinine 1.30 (L) 0.55 - 1.02 mg/dL    BUN/Creatinine Ratio 29     eGFR CKD-EPI (2021) Female >90 >=60 mL/min/1.8m2    Glucose 116 70 - 179 mg/dL    Calcium 9.0 8.7 - 86.5 mg/dL   Magnesium Level    Collection Time: 10/08/22  6:25 AM   Result Value Ref Range    Magnesium 2.0 1.6 - 2.6 mg/dL   Phosphorus Level    Collection Time: 10/08/22  6:25 AM   Result Value Ref Range    Phosphorus 4.6 2.4 - 5.1 mg/dL       Labs/Studies:  Labs and Studies from the last 24hrs per EMR and Reviewed    Objective:  BP 124/64  - Pulse 81  - Temp 36.7 ??C (98.1 ??F) (Oral)  - Resp 17  - Ht 165.1 cm (5' 5)  - Wt 60.8 kg (134 lb 1.6 oz)  - SpO2 99%  - BMI 22.32 kg/m??     GEN: alert, NAD  HEENT: +[place] in place, +NGT in place  CV: +s1/s2, nrrr, no m/r/g   PULM: fair air movement  ABD: mildly distended but overall soft, not that ttp today compared to prior, and bowel sounds were readily auscultated (without being hyperactive or otherwise abnormal)  EXT: wwp, no edema  NEURO: ma4e, no focal motor deficit  PSYCH: answers questions appropriately, less discursive in answers today

## 2022-10-10 LAB — BASIC METABOLIC PANEL
ANION GAP: 6 mmol/L (ref 5–14)
BLOOD UREA NITROGEN: 11 mg/dL (ref 9–23)
BUN / CREAT RATIO: 26
CALCIUM: 8.6 mg/dL — ABNORMAL LOW (ref 8.7–10.4)
CHLORIDE: 106 mmol/L (ref 98–107)
CO2: 28 mmol/L (ref 20.0–31.0)
CREATININE: 0.42 mg/dL — ABNORMAL LOW
EGFR CKD-EPI (2021) FEMALE: >90 mL/min/{1.73_m2} (ref >=60)
GLUCOSE RANDOM: 91 mg/dL (ref 70–179)
POTASSIUM: 3.2 mmol/L — ABNORMAL LOW (ref 3.4–4.8)
SODIUM: 140 mmol/L (ref 135–145)

## 2022-10-10 LAB — MAGNESIUM: MAGNESIUM: 1.9 mg/dL (ref 1.6–2.6)

## 2022-10-10 LAB — PHOSPHORUS: PHOSPHORUS: 4.5 mg/dL (ref 2.4–5.1)

## 2022-10-10 MED ADMIN — pantoprazole (Protonix) injection 40 mg: 40 mg | INTRAVENOUS | @ 13:00:00

## 2022-10-10 MED ADMIN — buprenorphine-naloxone (SUBOXONE) 2-0.5 mg SL film 0.5 mg of buprenorphine: .25 | SUBLINGUAL | @ 18:00:00

## 2022-10-10 MED ADMIN — methylnaltrexone (RELISTOR) injection 8 mg: 8 mg | SUBCUTANEOUS | @ 16:00:00

## 2022-10-10 MED ADMIN — ondansetron (ZOFRAN) injection 4 mg: 4 mg | INTRAVENOUS | @ 01:00:00

## 2022-10-10 MED ADMIN — promethazine (PHENERGAN) 6.5 mg in sodium chloride (NS) 0.9 % 50 mL IVPB: 6.5 mg | INTRAVENOUS | @ 05:00:00

## 2022-10-10 MED ADMIN — promethazine (PHENERGAN) 6.5 mg in sodium chloride (NS) 0.9 % 50 mL IVPB: 6.5 mg | INTRAVENOUS | @ 22:00:00

## 2022-10-10 MED ADMIN — Parenteral Nutrition (CENTRAL): INTRAVENOUS | @ 03:00:00 | Stop: 2022-10-10

## 2022-10-10 MED ADMIN — ketamine 500 mg in sodium chloride 0.9 % 250 mL (2 mg/mL): .1 mg/kg/h | INTRAVENOUS | @ 01:00:00 | Stop: 2022-10-16

## 2022-10-10 MED ADMIN — famotidine (PF) (PEPCID) injection 20 mg: 20 mg | INTRAVENOUS | @ 01:00:00

## 2022-10-10 MED ADMIN — oxyCODONE (ROXICODONE) immediate release tablet 15 mg: 15 mg | ORAL | @ 01:00:00 | Stop: 2022-10-23

## 2022-10-10 MED ADMIN — sodium chloride 0.45% (1/2 NS) infusion: 100 mL/h | INTRAVENOUS | @ 03:00:00

## 2022-10-10 MED ADMIN — oxyCODONE (ROXICODONE) immediate release tablet 15 mg: 15 mg | ORAL | @ 10:00:00 | Stop: 2022-10-10

## 2022-10-10 MED ADMIN — bisacodyl (DULCOLAX) suppository 10 mg: 10 mg | RECTAL | @ 01:00:00

## 2022-10-10 MED ADMIN — LORazepam (ATIVAN) tablet 0.5 mg: .5 mg | ORAL | @ 03:00:00

## 2022-10-10 MED ADMIN — LORazepam (ATIVAN) tablet 0.5 mg: .5 mg | ORAL | @ 14:00:00

## 2022-10-10 MED ADMIN — HYDROmorphone (PF) injection Syrg 0.5 mg: .5 mg | INTRAVENOUS | @ 22:00:00 | Stop: 2022-10-23

## 2022-10-10 MED ADMIN — buprenorphine-naloxone (SUBOXONE) 2-0.5 mg SL film 0.5 mg of buprenorphine: .25 | SUBLINGUAL | @ 01:00:00

## 2022-10-10 MED ADMIN — pantoprazole (Protonix) injection 40 mg: 40 mg | INTRAVENOUS | @ 01:00:00

## 2022-10-10 MED ADMIN — ondansetron (ZOFRAN) injection 4 mg: 4 mg | INTRAVENOUS | @ 10:00:00

## 2022-10-10 MED ADMIN — baclofen (LIORESAL) tablet 10 mg: 10 mg | ORAL | @ 13:00:00

## 2022-10-10 MED ADMIN — buprenorphine-naloxone (SUBOXONE) 2-0.5 mg SL film 0.5 mg of buprenorphine: .25 | SUBLINGUAL | @ 13:00:00

## 2022-10-10 MED ADMIN — sodium chloride 0.45% (1/2 NS) infusion: 100 mL/h | INTRAVENOUS | @ 13:00:00

## 2022-10-10 MED ADMIN — fat emulsion 20 % with fish oil (SMOFlipid) infusion 250 mL: 250 mL | INTRAVENOUS | @ 03:00:00 | Stop: 2022-10-10

## 2022-10-10 MED ADMIN — mirtazapine (REMERON) tablet 15 mg: 15 mg | GASTROENTERAL | @ 03:00:00

## 2022-10-10 MED ADMIN — bisacodyl (DULCOLAX) suppository 10 mg: 10 mg | RECTAL | @ 13:00:00

## 2022-10-10 MED ADMIN — baclofen (LIORESAL) tablet 10 mg: 10 mg | ORAL | @ 01:00:00

## 2022-10-10 MED ADMIN — promethazine (PHENERGAN) 6.5 mg in sodium chloride (NS) 0.9 % 50 mL IVPB: 6.5 mg | INTRAVENOUS | @ 14:00:00

## 2022-10-10 NOTE — Unmapped (Signed)
Department of Anesthesiology  Pain Medicine Division    Chronic Pain Followup Inpatient Consult Note    Requesting Attending Physician:  Arman Filter, MD  Service Requesting Consult:  Med [facility])    Assessment/Recommendations:    The patient was seen in consultation on request of Rocco Serene, MD regarding assistance with pain management for patient's chronic intractable abdominal pain with now acute on chronic recurrent ileus/NG tube placement.  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      Patient currently established with [facility] pain management center Dr. Dyann Ruddle, has been on oxycodone 10 mg 3 times daily, Suboxone 0.5 mg 3 times daily along with baclofen and pregabalin. Overall goal is to wean to buprenorphine and non-opioid medications and eventually off of buprenorphine as well. Through her psychiatrist she is on Ativan 0.5 mg 3 times daily as needed. She has contracted to avoid taking the ativan at the same time or close to when she takes her opioid pain medications.     Patient has had multiple prior inpatient stays either regarding acute exacerbation of chronic pain or ileus.  In the past 12 months she has had 2 infusions of ketamine with limited benefit (last infusion in May 2024) and at one point there was some transaminitis which was thought to be attributed from other etiologies like TPN versus other intra-abdominal pathologies.  In the past patient also has had lidocaine infusions during inpatient stay with limited/variable benefit.        Regarding ileus and her constipation, After discussion with the patient and mother we came to an agreement to attempt with a low-dose of naloxegol 12.5 mg, she has taken enterag around surgery time before and seemed to tolerate it. GI team recommended against neostigmine at this time. We expect the naloxegol to increase her abdominal pain at first but hope that we can continue it and even increase to 25mg  if able.     This is no doubt a very complex situation. Her biggest issue at this point seems to be the ileus. Medication management should focus on non-opioid medications, although she has trialed others in past with side effects or minimal benefit.  Hopefully minimizing opioids and working on her ileus will help get her out of the hospital quicker than previously.  Our goal while admitted would be for her to be able to manage her pain without the need for IV opioids and instead take oxycodone up to 15 mg 4 times daily as needed, with the goal of trying to get as close to oxycodone 10 mg 3 times daily as needed as possible.    Her Suboxone is currently increased from her home dose in an attempt to provide optimal basal control of her pain, favoring an increase in buprenorphine which is a partial mu opioid agonist over use of full mu opioid agonists, which can have worse GI side effect profiles.  Again, ultimately the goal would be to wean off of oxy and manage her pain with buprenorphine alone.     Has had lidocaine gtt this admission with most help. Has been avoiding IV HM with success. Has been having HA's c/w tension type headaches with likely rebound/medication over use component.     Interval: [name] [name] was switched to Relastor in order to help improve her ileus.  Unfortunately, she is experience significant abdominal pain in the setting  of this.  She obtained a one-time dose of IV hydromorphone yesterday with good effect.  Will continue to give her daily as needed IV hydromorphone 0.5 mg today and tomorrow.  Additionally, restarting ketamine infusion with a goal of having it on for 2 to 3 days to help her wean completely off of all nonbuprenorphine opioids in hopes of improving her bowel function.  Discussed risks and benefits of ketamine and patient is amenable to plan.  Patient has continued to experience headache despite IV APAP holiday, can consider Fioricet tomorrow if needed.    Recommendations:  -The chronic pain service is a consult service and does not place orders, just makes recommendations (except ketamine and lidocaine infusions)   -Please evaluate all patients on opioids for appropriateness of prescribing narcan at discharge.  The chronic pain service can assist with this.  Nasal narcan is covered by most insurances.  -Recommendations given apply to the current hospitalization and do not reflect long term recommendations.    -Patient is on a low dose ketamine infusion for pain   -Ketamine gtt #3 of 4 maximum over a 12 month period     -All ketamine orders will be managed by the [facility] Chronic Pain Service only-orders can only be placed by attendings and fellows   -Ketamine requires dedicated IV (PIV or central line lumen)   -Start ketamine infusion: 0.1mg /kg/hr Ideal body weight   -Patient location: Step down: dose can increase every 4 hours (can decrease anytime)   -Day 0. Started (date): 8/1   -Prior to starting: Recent EKG, CMP (creatinine, LFTs), urine pregnancy test (if applicable)   -Daily CMP (creatinine and LFTs daily) while running   -Please contact the Chronic Pain service with any questions or concerns about ketamine infusion.      - Discontinued lidocaine infusion at scheduled time.  09/28/22 - 10/01/22  - Patient amenable to continued IV Tylenol holiday given concern for medication overuse headache, HA still bad  - Ok to add IV HM daily PRN given Relistor pain, plan to continue tomorrow as well  - Pt will continue to try to avoid IV HM and minimize oxycodone and PRN Ativan use  - Pt agreed to trial a holiday of IV Benadryl  - If headaches are not improved from IV Tylenol holiday, consider Fioricet once daily as needed for headaches  - Agree with GI recs of naloxegol 12.5 mg p.o. daily.  GI following   - Continue Baclofen to 10 mg twice daily per NG tube  - Agree with primary team pain and bowel regimen   - Recommend continued physical therapy to work on bed level mobility   - Would recommend speech therapy consult to work on oral desensitization as she has not been able to tolerate anyone in her mouth such as ice chips or small sips of water  - Recommend involvement of inpatient psychology and complex care given patient's distress surrounding hospitalization      We will continue to follow.    Naloxone Rx at discharge?  Is patient on opioids? Yes.  1)Is dose >50MME?  Yes.  2) Is patient prescribed a benzodiazepine (w opioids)? Yes.  3)Hx of overdose?  No.  4) Hx of substance use disorder? No.  5) Opioids likely to last greater than a week after discharge? Yes.       If yes to 2 or more, prescribe naloxone at discharge.  Nasal narcan for most insured (Nasal narcan 4mg /actuation, prescribe 1 kit, instructions at [email or web address]).  For uninsured, chronic pain can work to assist in finding an option.  OTC nasal narcan now available at most pharmacies for around $45.    Interim History  There were no Acute Events Overnight.  The patient is not obtaining adequate pain relief on current medication regimen and feels that their pain is Somewhat controlled    Today patient reports pain is worse given Relistor     Analgesia Evaluation:  Pain at minimum: 0/10  Pain at maximum: 10/10    Current pain medication regimen (including how frequent PRN's were used):  Suboxone 2-0.5mg  TID   Tylenol IV   Baclofen 5 mg DID   Oxycodone 15mg  q4h PRN (x4)    Inpatient Medications  Current Facility-Administered Medications   Medication Dose Route Frequency Provider Last Rate Last Admin    baclofen (LIORESAL) tablet 10 mg  10 mg Oral BID Lambert Mody, MD PhD   10 mg at 10/09/22 5784    bisacodyl (DULCOLAX) suppository 10 mg  10 mg Rectal BID Lambert Mody, MD PhD   10 mg at 10/09/22 1440    buprenorphine-naloxone (SUBOXONE) 2-0.5 mg SL film 0.5 mg of buprenorphine  0.25 Film Sublingual TID Lambert Mody, MD PhD   0.5 mg of buprenorphine at 10/09/22 1527    calcium carbonate (TUMS) chewable tablet 400 mg elem calcium  400 mg elem calcium Oral Daily PRN Terri Piedra, AGNP        [Provider Hold] cefdinir (OMNICEF) capsule 300 mg  300 mg Oral Q12H [facility] Rocco Serene, MD        [Provider Hold] doxycycline (VIBRA-TABS) tablet 100 mg  100 mg Oral BID Rocco Serene, MD        famotidine (PF) (PEPCID) injection 20 mg  20 mg Intravenous At bedtime Rocco Serene, MD   20 mg at 10/08/22 2118    fat emulsion 20 % (INTRAlipid) infusion 100 mL  100 mL Intravenous Once PRN Lobonc, Ammie Ferrier, MD        fat emulsion 20 % (INTRAlipid) infusion 200 mL  200 mL Intravenous Once PRN Lobonc, Ammie Ferrier, MD        Parenteral Nutrition (CENTRAL)   Intravenous Continuous Arman Filter, MD        And    fat emulsion 20 % with fish oil (SMOFlipid) infusion 250 mL  250 mL Intravenous Continuous Arman Filter, MD        HYDROmorphone (PF) injection Syrg 0.5 mg  0.5 mg Intravenous Daily PRN Arman Filter, MD   0.5 mg at 10/09/22 1748    ketamine 500 mg in sodium chloride 0.9 % 250 mL (2 mg/mL)  0.1 mg/kg/hr (Ideal) Intravenous Continuous Nat Christen., MD        LORazepam (ATIVAN) tablet 0.5 mg  0.5 mg Oral TID PRN Lambert Mody, MD PhD   0.5 mg at 10/09/22 1342    methylnaltrexone (RELISTOR) injection 8 mg  8 mg Subcutaneous Daily Lambert Mody, MD PhD   8 mg at 10/09/22 1112    mirtazapine (REMERON) tablet 15 mg  15 mg Enteral tube: gastric Nightly Lambert Mody, MD PhD   15 mg at 10/08/22 2118    ondansetron (ZOFRAN) injection 4 mg  4 mg Intravenous Q6H PRN Terri Piedra, AGNP   4 mg at 10/08/22 1832    pantoprazole (Protonix) injection 40 mg  40 mg Intravenous BID Rocco Serene, MD   40 mg  at 10/09/22 2376    Parenteral Nutrition (CENTRAL)   Intravenous Continuous Lambert Mody, MD PhD 45 mL/hr at 10/08/22 2259 New Bag at 10/08/22 2259    phenol (CHLORASEPTIC) 1.4 % spray 2 spray  2 spray Mucous Membrane Q2H PRN Rocco Serene, MD   2 spray at 10/03/22 0138    [Provider Hold] polyethylene glycol (MIRALAX) packet 17 g  17 g Oral Daily PRN Terri Piedra, AGNP        promethazine (PHENERGAN) 6.5 mg in sodium chloride (NS) 0.9 % 50 mL IVPB  6.5 mg Intravenous Q6H PRN Suzzanne Cloud, MD 171 mL/hr at 10/09/22 1748 6.5 mg at 10/09/22 1748    sodium chloride (NS) 0.9 % infusion  10 mL/hr Intravenous Continuous Nat Christen., MD        sodium chloride 0.45% (1/2 NS) infusion  100 mL/hr Intravenous Continuous [name], MD 100 mL/hr at 10/09/22 0912 100 mL/hr at 10/09/22 0912         Objective:     Vital Signs    Temp:  [36.4 ??C (97.5 ??F)-36.9 ??C (98.5 ??F)] 36.7 ??C (98.1 ??F)  Heart Rate:  [65-99] 83  SpO2 Pulse:  [66-98] 84  Resp:  [16-18] 16  BP: (89-121)/(46-56) 89/56  MAP (mmHg):  [65-71] 67  SpO2:  [99 %-100 %] 99 %      Physical Exam     GENERAL:  Well developed, well-nourished female and looking uncomfortable from pain and NG tube discomfort. Sitting up in bed and appearing comfortable but intermittently teary eyed  HEAD/NECK:    Reveals normocephalic/atraumatic. NGT present, hoarse voice  CARDIOVASCULAR:   WWP, regular tate  LUNGS:   Normal work of breathing, [place]  EXTREMITIES:  Warm, no clubbing, cyanosis, or edema was noted.  NEUROLOGIC:    The patient was alert and oriented times four with normal language, attention, cognition and memory. Cranial nerve exam was grossly normal.    MUSCULOSKELETAL:    SKIN:  No obvious rashes lesions or erythema  PSY:  Appropriate affect and mood.    Test Results    Lab Results   Component Value Date    CREATININE 0.43 (L) 10/09/2022     Lab Results   Component Value Date    ALKPHOS 72 10/05/2022    BILITOT 0.2 (L) 10/05/2022    BILIDIR <0.10 08/08/2022    PROT 5.8 10/05/2022    ALBUMIN 3.1 (L) 10/05/2022    ALT 20 10/05/2022    AST 20 10/05/2022           Problem List    Principal Problem: Other acute postprocedural pain  Active Problems:    [name]    Intestinal polyps    [name]    Neoplasm related pain    Physical deconditioning    History of colectomy    Intractable abdominal pain    Generalized anxiety disorder with panic attacks    SBO (small bowel obstruction) (CMS-HCC)    Small intestinal bacterial overgrowth (SIBO)      Nat Christen, MD  Assistant Professor of Anesthesiology  Department of Anesthesiology   Divisions of General Anesthesiology and Pain Medicine     Medical Decision Making    Problems Addressed:  1 or more chronic illnesses with severe exacerbation, progression, or side effects of treatment (high)    Amount/Complexity:  Reviewed external records: Primary team progress note  Reviewed results: 8/1 CBC and 8/1 BMP and 7/28 CMP,  and ECG from 7/23  Ordered tests: none  Assessment requiring independent historian: patient's mother  Discussion of management/test results with medical professional: Primary team  Independent interpretation of test: none    Risk  High: Drug therapy requiring intensive monitoring for toxicity, Decision regarding hospitalization, Elective Major Surgery w/ risk factors, Emergency Major Surgery, Decision to DNR or de-escalate care because of poor prognosis, parental controlled substances (IV ketamine and IV opioids)

## 2022-10-10 NOTE — Unmapped (Signed)
Adolescent and Young Adult Cancer Program Visit     Service date: October 08, 2022    Encounter location: Inpatient - Adult     Clinician: [name], LCSW - AYA Clinical Social Worker     Patient identifiers: [name] [name] is a 22 y.o. with desmoid tumors and [name]. [name] [name] uses she/her pronouns.      Additional visit participants: pt's mother, Dr. Charlies Constable    Visit Summary     Joined Dr. Charlies Constable for rounds with [name] [name] and her mom, as planned yesterday, to provide communication support and continuity of care. During visit, [name] [name] asked appropriate questions and was amenable to plan as recommended by Dr. Charlies Constable. She noted prior severe pain following relestor injection, and inquired if receiving via IV would likely be different. She requested a one time order of IV dilaudid if needed given that she has experienced this pain with relestor in the past. Dr. Charlies Constable noted [name] [name]'s significant progress towards reducing opioid use with goal of helping resolve ileus. She expressed understanding of continued need to avoid opioid use when possible, which is consistent with her goals. She has noted many times including recently that her goal is to get off of opioids so she can regain independence. She had attempted to completely stop opioids in the week prior to current unplanned admission.    Discussed NG tube at length; per [name] [name] and her mom's report of attempted PO liquid intake yesterday and significant NG tube output overnight, Dr. Charlies Constable recommended much slower trialing of PO intake. Per Dr. Charlies Constable, I will continue to encourage [name] [name] to move towards NG removal when immediate replacement does not seem inevitable. This is consistent with her goals of discharge.     Dr. Charlies Constable placed psychiatry consult, [name] [name] aware. Spoke with [name] [name]'s outpatient psychiatrist Dr. Malcolm Metro, who agrees with consult and plans to provide handoff to psychiatry team. Will continue to work with Dr. Malcolm Metro and [facility] psychiatry team to plan for outpatient psychiatry follow up.    Follow Up/Plan:     Will see later this week for supportive counseling and continuity of care.    I will continue to follow this patient for AYA-appropriate support. They have my contact information and have been encouraged to contact me as needed.     Flowsheet     AYA Assessment  Primary Caregiver: Parent  Location Seen: Inpatient  Contact Point: During treatment  Referral Source: AYA team  Issues Discussed: Treatment information, Symptom management, Medication questions  AYA Team Interventions: Treatment-specific information, Psychoeducation, Care coordination, Adapting care to AYA needs  Referrals Made: Psychiatry  Follow Up: As needed  Time Spent (in minutes): 55    10/10/2022     [name], LCSW  Adolescent and Young Adult Visual merchandiser

## 2022-10-10 NOTE — Unmapped (Signed)
[facility]  Follow-Up Psychiatry Consult Note      Date of admission: 09/25/2022 10:30 AM  Service Date: October 10, 2022  Primary Team: Med [facility])  LOS:  LOS: 13 days      Assessment:   [name] [name] is a 22 y.o. 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 and reported past psych history of GAD and MDD, admitted 09/25/2022 10:30 AM for other acute postprocedural pain following injection of desmoid tumor with steroid and anesthetic under ultrasound guidance.  Patient was seen in consultation by request of hospitalist on [facility] for evaluation of anxiety and depression.      [name] [name] presents with symptoms consistent with a diagnosis of GAD and MDD, consistent with her historical diagnoses. Patient's symptoms appear to have worsened in the context of discontinuation of mirtazapine and ongoing medical stressors, including loss of independence and limited movement in achieving her longer term goals (e.g., completing nursing school). They are consistent with past reports of above diagnoses and include depressed mood, sleep disturbance, fatigue, anhedonia, passive thoughts of death, and increased anxiety with panic/anxiety attacks.     On today's evaluation, patient with slightly improved mood and anxiety compared to last visit, due to some stressors being relieved and others flaring up. As mirtazapine was only recently restarted, it might take some time for the medication to fully kick in, and we would not recommend additional changes at this time for mood/anxiety.     Diagnoses:   Active Hospital problems:  Principal Problem:    Other acute postprocedural pain  Active Problems:    [name]    Intestinal polyps    [name]    Neoplasm related pain    Physical deconditioning    History of colectomy    Intractable abdominal pain    Generalized anxiety disorder with panic attacks    SBO (small bowel obstruction) (CMS-HCC)    Small intestinal bacterial overgrowth (SIBO)       Problems edited/added by me:  No problems updated.    Risk Assessment:  ASQ screening result: not completed    -A full risk assessment was previously performed on 7/31.  Risk assessment remains essentially unchanged.    Current suicide risk: low risk  Current homicide risk: low risk      Recommendations:     Safety and Observation Level:   -- This patient is not currently under IVC. If safety concerns arise, please page psychiatry for an evaluation.    Medications:  -- continue mirtazapine 15mg  qHS for mood and anxiety  - continue lorazepam 0.5mg  TID PRN severe anxiety    Further Work-up:   -- No further recommendations at this time from a psychiatric standpoint    Behavioral / Environmental:   -- Utilize compassion and acknowledge the patient's experiences while setting clear and realistic expectations for care.     Follow-up:  -- When patient is discharged, please ensure that their AVS includes information about the 65 Suicide & Crisis Lifeline.  -- The patient currently receives mental health care with [facility] and we will attempt to coordinate followup with our psychiatry team on discharge   -- We will follow as needed at this time.     Thank you for this consult request. Recommendations have been communicated to the primary team. Please page [phone]  for any questions or concerns.     Discussed with and seen by Attending,  Elnita Maxwell, MD, who agrees with the assessment and plan.    [name], MD      Subjective     Relevant events since last seen by psychiatry: chronic pain recommended trialing ketamine infusion to help with weaning opioids (concern for narcotic bowel)    Patient Interview:  On encounter, patient was in bed with mother Natalia Leatherwood at bedside. She notes having a bad day yesterday due to anxiety surrounding her medical situation. She was able to feel better with the help and support of trusted staff. She denies side effects from restarting mirtazapine. She has started the ketamine drip although she is uncertain of its effects currently. She hopes that with time she notes more of a benefit. Patient continues to wish for a sense of normalcy in her life, which she feels she has difficulty achieving due to her medical concerns. Discussed taking things day by day. Anxiety is otherwise mildly improved although patient hopes that things will improve as mirtazapine gets more into her system. Patient otherwise had no concerns for psychiatry.     ROS:   All systems reviewed as negative/unremarkable aside from the following pertinent positives and negatives: anxiety    Collateral:   - Reviewed medical records in Epic    Relevant Updates to past psychiatric, medical/surgical, family, or social history: n/a    Current Medications:  Scheduled Meds:   baclofen  10 mg Oral BID    bisacodyl  10 mg Rectal BID    buprenorphine-naloxone  0.25 Film Sublingual TID    [Provider Hold] cefdinir  300 mg Oral Q12H [facility]    [Provider Hold] doxycycline  100 mg Oral BID    famotidine (PEPCID) IV  20 mg Intravenous At bedtime    methylnaltrexone  8 mg Subcutaneous Daily    mirtazapine  15 mg Enteral tube: gastric Nightly    pantoprazole (Protonix) intravenous solution  40 mg Intravenous BID     Continuous Infusions:   Parenteral Nutrition (CENTRAL)      And    fat emulsion 20 % with fish oil      ketamine 0.1 mg/kg/hr (10/10/22 0400)    Parenteral Nutrition (CENTRAL) 45 mL/hr at 10/10/22 0400    sodium chloride      sodium chloride 0.45 % 100 mL/hr (10/10/22 0835)     PRN Meds:.calcium carbonate, fat emulsion 20 %, fat emulsion 20 %, HYDROmorphone, LORazepam, ondansetron, oxyCODONE, phenol, [Provider Hold] polyethylene glycol, promethazine      Objective:   Vital signs:   Temp:  [36.5 ??C (97.7 ??F)-36.9 ??C (98.4 ??F)] 36.7 ??C (98.1 ??F)  Heart Rate:  [67-85] 84  SpO2 Pulse:  [67-85] 77  Resp:  [18-20] 18  BP: (99-127)/(47-77) 107/77  MAP (mmHg):  [64-84] 84  SpO2:  [99 %-100 %] 100 %    Physical Exam:  Gen: No acute distress.    Mental Status Exam:  Appearance:  appears stated age and has NG tube in   Attitude:   calm, cooperative   Behavior/Psychomotor:  appropriate eye contact and no abnormal movements   Speech/Language:   normal rate, not pressured, normal volume, normal fluency. normal articulation   Mood:  ???ok???   Affect:  mood congruent, euthymic, and full range    Thought process:  logical, linear, clear, coherent, goal directed   Thought content:    denies thoughts of self-harm. Denies SI, plans, or intent. Denies HI.  No grandiose, self-referential, persecutory, or paranoid delusions noted.   Perceptual disturbances:  denies auditory and visual hallucinations   Attention:  able to attend to interview without fluctuations in consciousness   Concentration:  Able to fully concentrate and attend   Orientation:  grossly oriented.   Memory:  not formally tested, but grossly intact   Fund of knowledge:   not formally assessed   Insight:    Fair   Judgment:   Fair   Impulse Control:  Fair     Relevant laboratory/imaging data was reviewed.    Additional Psychometric Testing:  Not applicable.    Consult Type and Time-Based Documentation:  This patient was evaluated in person.    Time-based billing disclaimer:  I personally spent 30   minutes face-to-face and non-face-to-face in the care of this patient, which includes all pre, intra, and post visit time on the date of service.  All documented time was specific to the E/M visit and does not include any procedures that may have been performed.

## 2022-10-10 NOTE — Unmapped (Addendum)
Daily Progress Note    Assessment/Plan:    Principal Problem:    Other acute postprocedural pain  Active Problems:    [name]    Intestinal polyps    [name]    Neoplasm related pain    Physical deconditioning    History of colectomy    Intractable abdominal pain    Generalized anxiety disorder with panic attacks    SBO (small bowel obstruction) (CMS-HCC)    Small intestinal bacterial overgrowth (SIBO)  Resolved Problems:    * No resolved hospital problems. 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    Ileus, multifactorial but with component of narcotic bowel, ongoing  Inability to tolerate PO, without diagnosis of malnutrition  Continues to have very small amount of stool output and flatus; bowel sounds hypoactive on exam today. Able to tolerate NG clamp trial but not tolerating much PO intake. GI previously following, now signed off. Discussed with Chronic Pain provider [name] today 8/1. Patient and mom are both amenable to ketamine infusion to facilitate weaning off of opioids given concern for narcotic bowel.   - Appreciate Chronic Pain recommendations  - NPO except sips/chips for comfort; would not advance diet until return of bowel function  - NG tube clamped; would not place to LIS unless having severe/intractable nausea/vomiting  - continue TPN for nutritional support, reorder daily  - HOLDING naloxegol --> continue trial of Relistor daily starting 7/31 in case of decreased absorption of naloxegol or ongoing use of NG tube limiting effect  - Hydromorphone 0.5 mg IV daily prn pain related to Relistor -- discussed with Chronic Pain and patient  - continue bowel regimen, needs extensive regimen including twice daily enema or suppository  - start ketamine infusion today per Chronic Pain to facilitate weaning opioids    Headache  Minimal improvement with IV fluids, magnesium, IV Benadryl. Concerning for rebound/medication overuse headache  - Continue Tylenol holiday (last dose 7/29)  - Consider adding Fioricet daily prn if no improvement with Tylenol holiday  - Avoid increasing opioids; avoid NSAIDs, avoid IV Benadryl    Syncope, uncertain etiology:  Possibly vasovagal, but either exacerbated (if not caused) by medication effects   - adjust benzos, opiates, other potentially vasoactive meds as tolerated   - broaden eval if recurrent, noting this has been issue previously during hospitalization     Muscle spasms and diffuse pain, uncertain etiology  Occurred post-procedurally, with reassuring evaluation (MRI spine, neuro c/s, CK normal)  - Appreciate Chronic Pain recommendations  - continue baclofen 10 mg BID (increased 7/31)  - continue ativan TID prn (PTA med), but caution against escalating (noting risks of concurrent benzos + opiates, possibility of contribution to syncope and other symptoms)  - oxycodone for severe pain, with caution given narcotic bowel   - start ketamine infusion today as above    Coffee-ground emesis  Noted in NGT 7/21 and 7/31. Most likely due to NG tube trauma and has prior hx of ulcers iso anticoagulation. Hgb stable 9.5-10. GI consulted and recommended conservative management  -  NPO pending return of bowel function  - NG tube clamped   - continue IV PPI twice daily    Chronic/stable/resolved problems:  [name]: hold nirogacestat   SIBO: hold home cefdinir, doxy  Desmoid tumors: resume nirogacestat as outpatient, follow with GI, VIR for steroid injections     FEN: NPO except sips/chips for comfort, TPN.   VTE ppx: ambulation  Code status: Full code  Dispo: home with HH PT/OT/TPN vs SNF pending resolution of ileus and successful removal of NG tube    I personally spent 50 minutes face-to-face and non-face-to-face in the care of this patient, which includes all pre, intra, and post visit time on the date of service.  All documented time was specific to the E/M visit and does not include any procedures that may have been performed.  ___________________________________________________________________    Subjective:  No acute events overnight.  Had very small BM after IV Relistor yesterday.  Some flatus this morning.  Did end up taking the IV Dilaudid several hours after getting the Relistor; says she tried her hardest to tough it out for several hours before taking.  Continues to have intermittent nausea; tolerating small sips of water.  Has tolerated NG tube clamped since overnight but did  have small amount of NG tube output yesterday evening while on LIS.  She and mom have many questions about why her ileus is taking so long to resolve, what could have caused this, and if/when the NG tube can be removed.  Patient hesitant to try taking her meds PO today; says that she immediately vomits them up, but seems to tolerate when given crushed via NG.    Labs/Studies:  Labs per EMR and Reviewed (last 24hrs)    Objective:  BP 89/56  - Pulse 83  - Temp 36.7 ??C (98.1 ??F) (Oral)  - Resp 16  - Ht 165.1 cm (5' 5)  - Wt 60.8 kg (134 lb 1.6 oz)  - SpO2 99%  - BMI 22.32 kg/m??     GEN: alert, NAD, mom at bedside  HEENT: NG tube in place  CV: +s1/s2, nrrr, no m/r/g   PULM: Decreased air movement throughout, apparent low inspiratory effort, no wheezes/crackles  ABD: Soft, nondistended, bowel sounds present but hypoactive  EXT: wwp, no edema  NEURO: No focal deficits  PSYCH: Anxious appearing

## 2022-10-10 NOTE — Unmapped (Signed)
Department of Anesthesiology  Pain Medicine Division    Chronic Pain Followup Inpatient Consult Note    Requesting Attending Physician:  Arman Filter, MD  Service Requesting Consult:  [facility])    Assessment/Recommendations:    The patient was seen in consultation on request of Rocco Serene, MD regarding assistance with pain management for patient's chronic intractable abdominal pain with now acute on chronic recurrent ileus/NG tube placement.  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      Patient currently established with [facility] pain management center Dr. Dyann Ruddle, has been on oxycodone 10 mg 3 times daily, Suboxone 0.5 mg 3 times daily along with baclofen and pregabalin. Overall goal is to wean to buprenorphine and non-opioid medications and eventually off of buprenorphine as well. Through her psychiatrist she is on Ativan 0.5 mg 3 times daily as needed. She has contracted to avoid taking the ativan at the same time or close to when she takes her opioid pain medications.     Patient has had multiple prior inpatient stays either regarding acute exacerbation of chronic pain or ileus.  In the past 12 months she has had 2 infusions of ketamine with limited benefit (last infusion in May 2024) and at one point there was some transaminitis which was thought to be attributed from other etiologies like TPN versus other intra-abdominal pathologies.  In the past patient also has had lidocaine infusions during inpatient stay with limited/variable benefit.        Regarding ileus and her constipation, After discussion with the patient and mother we came to an agreement to attempt with a low-dose of naloxegol 12.5 mg, she has taken enterag around surgery time before and seemed to tolerate it. GI team recommended against neostigmine at this time. We expect the naloxegol to increase her abdominal pain at first but hope that we can continue it and even increase to 25mg  if able.     This is no doubt a very complex situation. Her biggest issue at this point seems to be the ileus. Medication management should focus on non-opioid medications, although she has trialed others in past with side effects or minimal benefit.  Hopefully minimizing opioids and working on her ileus will help get her out of the hospital quicker than previously.  Our goal while admitted would be for her to be able to manage her pain without the need for IV opioids and instead take oxycodone up to 15 mg 4 times daily as needed, with the goal of trying to get as close to oxycodone 10 mg 3 times daily as needed as possible.    Her Suboxone is currently increased from her home dose in an attempt to provide optimal basal control of her pain, favoring an increase in buprenorphine which is a partial mu opioid agonist over use of full mu opioid agonists, which can have worse GI side effect profiles.  Again, ultimately the goal would be to wean off of oxy and manage her pain with buprenorphine alone.     Has had lidocaine gtt this admission with most help. Has been avoiding IV HM with success. Has been having HA's c/w tension type headaches with likely rebound/medication over use component.     Interval: [name] [name] continues to note that her nausea and her headaches remain largely unchanged but did appear more comfortable than in days prior to  today.  She denies vivid dreams, worsened anxiety, worsened nausea, worsened headaches, or hallucinations on her current dose of ketamine.  However, she also notes that this current dose does not seem to be as efficacious as previous trials of ketamine.  As a result, we will increase her dose to 0.2 mg/kg/h, she has tolerated this dose in the past.  Patient is willing to trial coming off of oxycodone today while keeping daily as needed IV hydromorphone for breakthrough pain.    Recommendations:  -The chronic pain service is a consult service and does not place orders, just makes recommendations (except ketamine and lidocaine infusions)   -Please evaluate all patients on opioids for appropriateness of prescribing narcan at discharge.  The chronic pain service can assist with this.  Nasal narcan is covered by most insurances.  -Recommendations given apply to the current hospitalization and do not reflect long term recommendations.    -Patient is on a low dose ketamine infusion for pain   -Ketamine gtt #3 of 4 maximum over a 12 month period     -All ketamine orders will be managed by the [facility] Chronic Pain Service only-orders can only be placed by attendings and fellows   -Ketamine requires dedicated IV (PIV or central line lumen)   -Increase ketamine infusion: 0.2 mg/kg/hr Ideal body weight   -Patient location: Step down: dose can increase every 4 hours (can decrease anytime)   -Day 1. Started (date): 8/1   -Prior to starting: Recent EKG, CMP (creatinine, LFTs), urine pregnancy test (if applicable)   -Please add Daily CMP (creatinine and LFTs daily) while running   -Please contact the Chronic Pain service with any questions or concerns about ketamine infusion.      - Discontinued lidocaine infusion at scheduled time.  09/28/22 - 10/01/22  - Patient amenable to continued IV Tylenol holiday given concern for medication overuse headache, HA still bad  - Continue IV HM daily PRN given Relistor pain, plan to discontinue tomorrow  - Discontinue PRN oxycodone  - Pt will continue to try to avoid PRN Ativan use  - Pt agreed to trial a holiday of IV Benadryl  - If headaches are not improved from IV Tylenol holiday, consider Fioricet once daily as needed for headaches  - Agree with GI recs of naloxegol 12.5 mg p.o. daily.  GI following   - Continue Baclofen to 10 mg twice daily per NG tube  - Agree with primary team pain and bowel regimen   - Recommend continued physical therapy to work on bed level mobility   - Would recommend speech therapy consult to work on oral desensitization as she has not been able to tolerate anyone in her mouth such as ice chips or small sips of water  - Recommend involvement of inpatient psychology and complex care given patient's distress surrounding hospitalization      We will continue to follow.    Naloxone Rx at discharge?  Is patient on opioids? Yes.  1)Is dose >50MME?  Yes.  2) Is patient prescribed a benzodiazepine (w opioids)? Yes.  3)Hx of overdose?  No.  4) Hx of substance use disorder? No.  5) Opioids likely to last greater than a week after discharge? Yes.       If yes to 2 or more, prescribe naloxone at discharge.  Nasal narcan for most insured (Nasal narcan 4mg /actuation, prescribe 1 kit, instructions at [email or web address]).  For uninsured, chronic pain can work to assist in finding an option.  OTC nasal  narcan now available at most pharmacies for around $45.    Interim History  There were no Acute Events Overnight.  The patient is not obtaining adequate pain relief on current medication regimen and feels that their pain is Somewhat controlled    Today patient reports pain is worse given Relistor     Analgesia Evaluation:  Pain at minimum: 0/10  Pain at maximum: 10/10    Current pain medication regimen (including how frequent PRN's were used):  Suboxone 2-0.5mg  TID   Tylenol IV   Baclofen 5 mg DID   Oxycodone 15mg  q4h PRN (x4)    Inpatient Medications  Current Facility-Administered Medications   Medication Dose Route Frequency Provider Last Rate Last Admin    baclofen (LIORESAL) tablet 10 mg  10 mg Oral BID Lambert Mody, MD PhD   10 mg at 10/10/22 0844    bisacodyl (DULCOLAX) suppository 10 mg  10 mg Rectal BID Lambert Mody, MD PhD   10 mg at 10/10/22 0848    buprenorphine-naloxone (SUBOXONE) 2-0.5 mg SL film 0.5 mg of buprenorphine  0.25 Film Sublingual TID Lambert Mody, MD PhD   0.5 mg of buprenorphine at 10/10/22 1416    calcium carbonate (TUMS) chewable tablet 400 mg elem calcium  400 mg elem calcium Oral Daily PRN Terri Piedra, AGNP        [Provider Hold] cefdinir (OMNICEF) capsule 300 mg  300 mg Oral Q12H [facility] Rocco Serene, MD        [Provider Hold] doxycycline (VIBRA-TABS) tablet 100 mg  100 mg Oral BID Rocco Serene, MD        famotidine (PF) (PEPCID) injection 20 mg  20 mg Intravenous At bedtime Rocco Serene, MD   20 mg at 10/09/22 2047    fat emulsion 20 % (INTRAlipid) infusion 100 mL  100 mL Intravenous Once PRN Lobonc, Ammie Ferrier, MD        fat emulsion 20 % (INTRAlipid) infusion 200 mL  200 mL Intravenous Once PRN Lobonc, Ammie Ferrier, MD        Parenteral Nutrition (CENTRAL)   Intravenous Continuous Arman Filter, MD        And    fat emulsion 20 % with fish oil (SMOFlipid) infusion 250 mL  250 mL Intravenous Continuous Arman Filter, MD        HYDROmorphone (PF) injection Syrg 0.5 mg  0.5 mg Intravenous Daily PRN Arman Filter, MD   0.5 mg at 10/09/22 1748    ketamine 500 mg in sodium chloride 0.9 % 250 mL (2 mg/mL)  0.1 mg/kg/hr (Ideal) Intravenous Continuous Nat Christen., MD 2.85 mL/hr at 10/10/22 0400 0.1 mg/kg/hr at 10/10/22 0400    LORazepam (ATIVAN) tablet 0.5 mg  0.5 mg Oral TID PRN Lambert Mody, MD PhD   0.5 mg at 10/10/22 1610    methylnaltrexone (RELISTOR) injection 8 mg  8 mg Subcutaneous Daily Lambert Mody, MD PhD   8 mg at 10/10/22 1221    mirtazapine (REMERON) tablet 15 mg  15 mg Enteral tube: gastric Nightly Lambert Mody, MD PhD   15 mg at 10/09/22 2251    ondansetron (ZOFRAN) injection 4 mg  4 mg Intravenous Q6H PRN Terri Piedra, AGNP   4 mg at 10/10/22 9604    oxyCODONE (ROXICODONE) immediate release tablet 15 mg  15 mg Oral Q4H PRN Dorise Hiss, ACNP   15 mg at 10/10/22 [phone]  pantoprazole (Protonix) injection 40 mg  40 mg Intravenous BID Rocco Serene, MD   40 mg at 10/10/22 1914    Parenteral Nutrition (CENTRAL)   Intravenous Continuous Arman Filter, MD 45 mL/hr at 10/10/22 0400 Rate Verify at 10/10/22 0400    phenol (CHLORASEPTIC) 1.4 % spray 2 spray  2 spray Mucous Membrane Q2H PRN Rocco Serene, MD   2 spray at 10/03/22 0138    [Provider Hold] polyethylene glycol (MIRALAX) packet 17 g  17 g Oral Daily PRN Terri Piedra, AGNP        promethazine (PHENERGAN) 6.5 mg in sodium chloride (NS) 0.9 % 50 mL IVPB  6.5 mg Intravenous Q6H PRN Suzzanne Cloud, MD   Stopped at 10/10/22 1013    sodium chloride (NS) 0.9 % infusion  10 mL/hr Intravenous Continuous Nat Christen., MD        sodium chloride 0.45% (1/2 NS) infusion  100 mL/hr Intravenous Continuous [name], MD 100 mL/hr at 10/10/22 0835 100 mL/hr at 10/10/22 0835         Objective:     Vital Signs    Temp:  [36.5 ??C (97.7 ??F)-36.9 ??C (98.4 ??F)] 36.7 ??C (98.1 ??F)  Heart Rate:  [67-85] 84  SpO2 Pulse:  [67-85] 77  Resp:  [16-20] 18  BP: (89-127)/(47-77) 107/77  MAP (mmHg):  [64-84] 84  SpO2:  [99 %-100 %] 100 %      Physical Exam     GENERAL:  Well developed, well-nourished female and looking uncomfortable from pain and NG tube discomfort. Sitting up in bed and appearing significantly more comfortable today  HEAD/NECK:    Reveals normocephalic/atraumatic. NGT present, hoarse voice  CARDIOVASCULAR:   WWP, regular tate  LUNGS:   Normal work of breathing, [place]  EXTREMITIES:  Warm, no clubbing, cyanosis, or edema was noted.  NEUROLOGIC:    The patient was alert and oriented times four with normal language, attention, cognition and memory. Cranial nerve exam was grossly normal.    MUSCULOSKELETAL:    SKIN:  No obvious rashes lesions or erythema  PSY:  Appropriate affect and mood.    Test Results    Lab Results   Component Value Date    CREATININE 0.42 (L) 10/10/2022     Lab Results   Component Value Date    ALKPHOS 72 10/05/2022    BILITOT 0.2 (L) 10/05/2022    BILIDIR <0.10 08/08/2022    PROT 5.8 10/05/2022    ALBUMIN 3.1 (L) 10/05/2022    ALT 20 10/05/2022    AST 20 10/05/2022           Problem List    Principal Problem:    Other acute postprocedural pain  Active Problems:    [name]    Intestinal polyps    [name]    Neoplasm related pain    Physical deconditioning    History of colectomy    Intractable abdominal pain    Generalized anxiety disorder with panic attacks    SBO (small bowel obstruction) (CMS-HCC)    Small intestinal bacterial overgrowth (SIBO)      Nat Christen, MD  Assistant Professor of Anesthesiology  Department of Anesthesiology   Divisions of General Anesthesiology and Pain Medicine     Medical Decision Making    Problems Addressed:  1 or more chronic illnesses with severe exacerbation, progression, or side effects of treatment (high)    Amount/Complexity:  Reviewed external records:  Primary team progress note  Reviewed results: 8/2 BMP   Ordered tests: none  Assessment requiring independent historian: patient's mother  Discussion of management/test results with medical professional: Primary team  Independent interpretation of test: none    Risk  High: Drug therapy requiring intensive monitoring for toxicity, Decision regarding hospitalization, Elective Major Surgery w/ risk factors, Emergency Major Surgery, Decision to DNR or de-escalate care because of poor prognosis, parental controlled substances (IV ketamine and IV opioids)

## 2022-10-10 NOTE — Unmapped (Signed)
Pt resting in hospital room throughout shift. Pt A&Ox4 and saturating >95% on 2L [place]. MAPs >65. UOP >28mL/hr thus far. Pt in NSR. Ketamine gtt initiated, awaiting results. PRNs given for anxiety and nausea with some effect, per pt report. Pt hooked back to LIWS 2/2 complaints of nausea; provider aware. Bed in low, locked position, bedside table and call bell within reach, nonskid socks on throughout shift. Pt has remained free of injury. Will report to oncoming shift.     Problem: Adult Inpatient Plan of Care  Goal: Plan of Care Review  Outcome: Ongoing - Unchanged     Problem: Adult Inpatient Plan of Care  Goal: Patient-Specific Goal (Individualized)  Outcome: Ongoing - Unchanged     Problem: Adult Inpatient Plan of Care  Goal: Absence of Hospital-Acquired Illness or Injury  Outcome: Ongoing - Unchanged  Intervention: Identify and Manage Fall Risk  Recent Flowsheet Documentation  Taken 10/10/2022 0200 by Lonia Mad, RN  Safety Interventions:   bed alarm   commode/urinal/bedpan at bedside   environmental modification   fall reduction program maintained   lighting adjusted for tasks/safety   low bed   nonskid shoes/slippers when out of bed  Taken 10/10/2022 0000 by Amilia Vandenbrink, Netta Neat, RN  Safety Interventions:   bed alarm   commode/urinal/bedpan at bedside   environmental modification   fall reduction program maintained   lighting adjusted for tasks/safety   low bed   nonskid shoes/slippers when out of bed  Taken 10/09/2022 2200 by Klea Nall, Netta Neat, RN  Safety Interventions:   bed alarm   commode/urinal/bedpan at bedside   environmental modification   fall reduction program maintained   lighting adjusted for tasks/safety   low bed   nonskid shoes/slippers when out of bed  Taken 10/09/2022 2000 by Lonia Mad, RN  Safety Interventions:   bed alarm   environmental modification   fall reduction program maintained   lighting adjusted for tasks/safety   low bed   nonskid shoes/slippers when out of bed  Intervention: Prevent Skin Injury  Recent Flowsheet Documentation  Taken 10/10/2022 0200 by Lonia Mad, RN  Device Skin Pressure Protection: adhesive use limited  Skin Protection: adhesive use limited  Taken 10/10/2022 0000 by Lonia Mad, RN  Positioning for Skin: Supine/Back  Device Skin Pressure Protection:   absorbent pad utilized/changed   adhesive use limited  Skin Protection: adhesive use limited  Taken 10/09/2022 2200 by Lonia Mad, RN  Positioning for Skin: Supine/Back  Device Skin Pressure Protection:   absorbent pad utilized/changed   adhesive use limited  Skin Protection: adhesive use limited  Taken 10/09/2022 2000 by Lonia Mad, RN  Positioning for Skin: Supine/Back  Device Skin Pressure Protection:   absorbent pad utilized/changed   adhesive use limited  Skin Protection: adhesive use limited  Intervention: Prevent and Manage VTE (Venous Thromboembolism) Risk  Recent Flowsheet Documentation  Taken 10/10/2022 0000 by Lonia Mad, RN  VTE Prevention/Management:   ambulation promoted   fluids promoted  Taken 10/09/2022 2000 by Lonia Mad, RN  VTE Prevention/Management:   ambulation promoted   fluids promoted

## 2022-10-10 NOTE — Unmapped (Signed)
Daily Progress Note    Assessment/Plan:    Principal Problem:    Other acute postprocedural pain  Active Problems:    [name]    Intestinal polyps    [name]    Neoplasm related pain    Physical deconditioning    History of colectomy    Intractable abdominal pain    Generalized anxiety disorder with panic attacks    SBO (small bowel obstruction) (CMS-HCC)    Small intestinal bacterial overgrowth (SIBO)  Resolved Problems:    * No resolved hospital problems. 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    Ileus, multifactorial but with component of narcotic bowel, ongoing  Inability to tolerate PO, without diagnosis of malnutrition  Continues to have very small amount of stool output and flatus; bowel sounds hypoactive on exam today. Able to tolerate NG clamp trial but not tolerating much PO intake. GI previously following, now signed off. Discussed with Chronic Pain provider [name] today 8/1. Patient and mom are both amenable to ketamine infusion to facilitate weaning off of opioids given concern for narcotic bowel.   - Appreciate Chronic Pain recommendations  - NPO except sips/chips for comfort; would not advance diet until return of bowel function  - NG tube clamped; would not place to LIS unless having severe/intractable nausea/vomiting   - Patient needs to be able to swallow PO meds to be able to have NG tube removed  - continue TPN for nutritional support, reorder daily  - HOLDING naloxegol --> continue trial of Relistor daily since 7/31 in case of decreased absorption of naloxegol or ongoing use of NG tube limiting effect  - Hydromorphone 0.5 mg IV daily prn pain related to Relistor -- discussed with Chronic Pain and patient  - continue bowel regimen, needs extensive regimen including twice daily enema or suppository  - continue ketamine infusion (8/1 - ), plan to increase dose per Chronic Pain  - discontinue oxycodone pending ketamine increase    Headache  Minimal improvement with IV fluids, magnesium, IV Benadryl. Concerning for rebound/medication overuse headache  - Continue Tylenol holiday (last dose 7/29)  - Consider adding Fioricet daily prn if no improvement with Tylenol holiday  - Working to minimize opioids; avoid NSAIDs, avoid IV Benadryl    Syncope, uncertain etiology:  Possibly vasovagal, but either exacerbated (if not caused) by medication effects   - adjust benzos, opiates, other potentially vasoactive meds as tolerated   - broaden eval if recurrent, noting this has been issue previously during hospitalization     Muscle spasms and diffuse pain, uncertain etiology  Occurred post-procedurally, with reassuring evaluation (MRI spine, neuro c/s, CK normal)  - Appreciate Chronic Pain recommendations  - continue baclofen 10 mg BID (increased 7/31)  - continue ativan TID prn (PTA med), but caution against escalating (noting risks of concurrent benzos + opiates, possibility of contribution to syncope and other symptoms)  - increase ketamine infusion today as above  - discontinue oxycodone pending increase in ketamine infusion    Coffee-ground emesis  Noted in NGT 7/21  and 7/31. Most likely due to NG tube trauma and has prior hx of ulcers iso anticoagulation. Hgb stable 9.5-10. GI consulted and recommended conservative management  - NPO pending return of bowel function  - NG tube clamped   - continue IV PPI twice daily    Chronic/stable/resolved problems:  [name]: hold nirogacestat   SIBO: hold home cefdinir, doxy  Desmoid tumors: resume nirogacestat as outpatient, follow with GI, VIR for steroid injections     FEN: NPO except sips/chips for comfort, TPN.   VTE ppx: ambulation  Code status: Full code  Dispo: home with HH PT/OT/TPN vs SNF pending resolution of ileus and successful removal of NG tube    I personally spent 50 minutes face-to-face and non-face-to-face in the care of this patient, which includes all pre, intra, and post visit time on the date of service.  All documented time was specific to the E/M visit and does not include any procedures that may have been performed.  ___________________________________________________________________    Subjective:  No acute events overnight.  Has been pushing herself to ambulate more; had a syncopal episode after ambulating to the door in her room yesterday with associated tachycardia to 160s and diffuse body trembling/muscles shaking per patient and mom.  Has been pushing herself to not use opioids for her acute pain, and has been using distraction techniques.  Has been tolerating small sips of liquids, but has not yet swallowed her pills.  Awoke from sleeping overnight with a choking sensation and feeling that she could not breathe and nausea; NG tube was briefly placed to low intermittent suction, but has been clamped otherwise since overnight.  Tolerating Relistor, although still with some abdominal pain.  Passing small amounts of flatus.  Has had a small bowel movement, but only immediately after receiving suppository.  Otherwise, received a massage for massage therapist today and had her hair washed by OT and was visited by her pastor and his wife who are in town. Patient seen at bedside with [name], her outpatient social worker.     Labs/Studies:  Labs per EMR and Reviewed (last 24hrs)    Objective:  BP 107/77  - Pulse 84  - Temp 36.7 ??C (98.1 ??F) (Oral)  - Resp 18  - Ht 165.1 cm (5' 5)  - Wt 61 kg (134 lb 6.4 oz)  - SpO2 100%  - BMI 22.37 kg/m??     GEN: More well-appearing, sitting up in chair, mom at bedside  HEENT: NG tube in place  CV: +s1/s2, nrrr, no m/r/g   PULM: Decreased air movement throughout, apparent low inspiratory effort, no wheezes/crackles  ABD: Soft, mildly distended, bowel sounds present in RUQ/RLQ but hypoactive  EXT: wwp, no edema  NEURO: No focal deficits  PSYCH: Less anxious appearing, intermittently tearful when discussing her setbacks with deconditioning

## 2022-10-11 LAB — BASIC METABOLIC PANEL
ANION GAP: 6 mmol/L (ref 5–14)
BLOOD UREA NITROGEN: 11 mg/dL (ref 9–23)
BUN / CREAT RATIO: 26
CALCIUM: 9.2 mg/dL (ref 8.7–10.4)
CHLORIDE: 107 mmol/L (ref 98–107)
CO2: 29 mmol/L (ref 20.0–31.0)
CREATININE: 0.42 mg/dL — ABNORMAL LOW
EGFR CKD-EPI (2021) FEMALE: >90 mL/min/{1.73_m2} (ref >=60)
GLUCOSE RANDOM: 90 mg/dL (ref 70–179)
POTASSIUM: 4 mmol/L (ref 3.4–4.8)
SODIUM: 142 mmol/L (ref 135–145)

## 2022-10-11 LAB — HEPATIC FUNCTION PANEL
ALBUMIN: 3.2 g/dL — ABNORMAL LOW (ref 3.4–5.0)
ALKALINE PHOSPHATASE: 61 U/L (ref 46–116)
ALT (SGPT): 13 U/L (ref 10–49)
AST (SGOT): 16 U/L (ref <=34)
BILIRUBIN DIRECT: <0.1 mg/dL (ref 0.00–0.30)
BILIRUBIN TOTAL: 0.2 mg/dL — ABNORMAL LOW (ref 0.3–1.2)
PROTEIN TOTAL: 6.3 g/dL (ref 5.7–8.2)

## 2022-10-11 LAB — MAGNESIUM: MAGNESIUM: 2 mg/dL (ref 1.6–2.6)

## 2022-10-11 LAB — PHOSPHORUS: PHOSPHORUS: 5.1 mg/dL (ref 2.4–5.1)

## 2022-10-11 MED ADMIN — buprenorphine-naloxone (SUBOXONE) 2-0.5 mg SL film 0.5 mg of buprenorphine: .25 | SUBLINGUAL

## 2022-10-11 MED ADMIN — methylnaltrexone (RELISTOR) injection 8 mg: 8 mg | SUBCUTANEOUS | @ 17:00:00

## 2022-10-11 MED ADMIN — sodium chloride 0.45% (1/2 NS) infusion: 100 mL/h | INTRAVENOUS | @ 18:00:00

## 2022-10-11 MED ADMIN — ondansetron (ZOFRAN) injection 4 mg: 4 mg | INTRAVENOUS

## 2022-10-11 MED ADMIN — sodium chloride 0.45% (1/2 NS) infusion: 100 mL/h | INTRAVENOUS | @ 07:00:00

## 2022-10-11 MED ADMIN — Parenteral Nutrition (CENTRAL): INTRAVENOUS | @ 03:00:00 | Stop: 2022-10-11

## 2022-10-11 MED ADMIN — famotidine (PF) (PEPCID) injection 20 mg: 20 mg | INTRAVENOUS

## 2022-10-11 MED ADMIN — pantoprazole (Protonix) injection 40 mg: 40 mg | INTRAVENOUS

## 2022-10-11 MED ADMIN — promethazine (PHENERGAN) 6.5 mg in sodium chloride (NS) 0.9 % 50 mL IVPB: 6.5 mg | INTRAVENOUS | @ 16:00:00

## 2022-10-11 MED ADMIN — baclofen (LIORESAL) tablet 10 mg: 10 mg | ORAL

## 2022-10-11 MED ADMIN — buprenorphine-naloxone (SUBOXONE) 2-0.5 mg SL film 0.5 mg of buprenorphine: .25 | SUBLINGUAL | @ 18:00:00

## 2022-10-11 MED ADMIN — oxyCODONE (ROXICODONE) immediate release tablet 15 mg: 15 mg | ORAL | Stop: 2022-10-10

## 2022-10-11 MED ADMIN — bisacodyl (DULCOLAX) suppository 10 mg: 10 mg | RECTAL

## 2022-10-11 MED ADMIN — buprenorphine-naloxone (SUBOXONE) 2-0.5 mg SL film 0.5 mg of buprenorphine: .25 | SUBLINGUAL | @ 14:00:00

## 2022-10-11 MED ADMIN — LORazepam (ATIVAN) tablet 0.5 mg: .5 mg | ORAL | @ 22:00:00

## 2022-10-11 MED ADMIN — fat emulsion 20 % with fish oil (SMOFlipid) infusion 250 mL: 250 mL | INTRAVENOUS | @ 03:00:00 | Stop: 2022-10-11

## 2022-10-11 MED ADMIN — ondansetron (ZOFRAN) injection 4 mg: 4 mg | INTRAVENOUS | @ 12:00:00

## 2022-10-11 MED ADMIN — LORazepam (ATIVAN) tablet 0.5 mg: .5 mg | ORAL | @ 12:00:00

## 2022-10-11 MED ADMIN — baclofen (LIORESAL) tablet 10 mg: 10 mg | ORAL | @ 14:00:00

## 2022-10-11 MED ADMIN — promethazine (PHENERGAN) 6.5 mg in sodium chloride (NS) 0.9 % 50 mL IVPB: 6.5 mg | INTRAVENOUS | @ 22:00:00

## 2022-10-11 MED ADMIN — promethazine (PHENERGAN) 6.5 mg in sodium chloride (NS) 0.9 % 50 mL IVPB: 6.5 mg | INTRAVENOUS | @ 10:00:00

## 2022-10-11 MED ADMIN — pantoprazole (Protonix) injection 40 mg: 40 mg | INTRAVENOUS | @ 14:00:00

## 2022-10-11 MED ADMIN — potassium chloride 20 mEq in 100 mL IVPB Premix: 20 meq | INTRAVENOUS | Stop: 2022-10-10

## 2022-10-11 MED ADMIN — bisacodyl (DULCOLAX) suppository 10 mg: 10 mg | RECTAL | @ 14:00:00

## 2022-10-11 MED ADMIN — ondansetron (ZOFRAN) injection 4 mg: 4 mg | INTRAVENOUS | @ 20:00:00

## 2022-10-11 MED ADMIN — mirtazapine (REMERON) tablet 15 mg: 15 mg | GASTROENTERAL | @ 03:00:00

## 2022-10-11 NOTE — Unmapped (Signed)
Problem: Adult Inpatient Plan of Care  Goal: Plan of Care Review  10/10/2022 1859 by Ardyth Gal, RN  Outcome: Ongoing - Unchanged  10/10/2022 0720 by Ardyth Gal, RN  Outcome: Ongoing - Unchanged  Goal: Patient-Specific Goal (Individualized)  10/10/2022 1859 by Ardyth Gal, RN  Outcome: Ongoing - Unchanged  10/10/2022 0720 by Ardyth Gal, RN  Outcome: Ongoing - Unchanged  Goal: Absence of Hospital-Acquired Illness or Injury  10/10/2022 1859 by Ardyth Gal, RN  Outcome: Ongoing - Unchanged  10/10/2022 0720 by Ardyth Gal, RN  Outcome: Ongoing - Unchanged  Intervention: Identify and Manage Fall Risk  Recent Flowsheet Documentation  Taken 10/10/2022 0715 by Ardyth Gal, RN  Safety Interventions:   bed alarm   commode/urinal/bedpan at bedside   fall reduction program maintained   family at bedside   low bed   lighting adjusted for tasks/safety  Intervention: Prevent Skin Injury  Recent Flowsheet Documentation  Taken 10/10/2022 0715 by Ardyth Gal, RN  Skin Protection: adhesive use limited  Goal: Optimal Comfort and Wellbeing  10/10/2022 1859 by Ardyth Gal, RN  Outcome: Ongoing - Unchanged  10/10/2022 0720 by Ardyth Gal, RN  Outcome: Ongoing - Unchanged  Goal: Readiness for Transition of Care  10/10/2022 1859 by Ardyth Gal, RN  Outcome: Ongoing - Unchanged  10/10/2022 0720 by Ardyth Gal, RN  Outcome: Ongoing - Unchanged  Goal: Rounds/Family Conference  10/10/2022 1859 by Ardyth Gal, RN  Outcome: Ongoing - Unchanged  10/10/2022 0720 by Ardyth Gal, RN  Outcome: Ongoing - Unchanged     Problem: Latex Allergy  Goal: Absence of Allergy Symptoms  10/10/2022 1859 by Ardyth Gal, RN  Outcome: Ongoing - Unchanged  10/10/2022 0720 by Ardyth Gal, RN  Outcome: Ongoing - Unchanged     Problem: Fall Injury Risk  Goal: Absence of Fall and Fall-Related Injury  10/10/2022 1859 by Ardyth Gal, RN  Outcome: Ongoing - Unchanged  10/10/2022 0720 by Ardyth Gal, RN  Outcome: Ongoing - Unchanged  Intervention: Promote Injury-Free Environment  Recent Flowsheet Documentation  Taken 10/10/2022 0715 by Ardyth Gal, RN  Safety Interventions:   bed alarm   commode/urinal/bedpan at bedside   fall reduction program maintained   family at bedside   low bed   lighting adjusted for tasks/safety     Problem: Skin Injury Risk Increased  Goal: Skin Health and Integrity  10/10/2022 1859 by Ardyth Gal, RN  Outcome: Ongoing - Unchanged  10/10/2022 0720 by Ardyth Gal, RN  Outcome: Ongoing - Unchanged  Intervention: Optimize Skin Protection  Recent Flowsheet Documentation  Taken 10/10/2022 0715 by Ardyth Gal, RN  Pressure Reduction Techniques: frequent weight shift encouraged  Head of Bed (HOB) Positioning: HOB at 30-45 degrees  Pressure Reduction Devices: pressure-redistributing mattress utilized  Skin Protection: adhesive use limited     Problem: Self-Care Deficit  Goal: Improved Ability to Complete Activities of Daily Living  10/10/2022 1859 by Ardyth Gal, RN  Outcome: Ongoing - Unchanged  10/10/2022 0720 by Ardyth Gal, RN  Outcome: Ongoing - Unchanged

## 2022-10-11 NOTE — Unmapped (Signed)
Pt is A/Ox4, VSS on 2L [place]. Continuous ketamine infusing. TPN, continuous fluids infusing. Ketamine infusion increased today to .25mg /kg/hr. Adequate UOP, 2 small Bms. Pt ambulated outside of her room today. HR 90-130s. Up to 150s but resolved quickly with rest. C/o nausea and anxiety. See PRNs. Family at bedside and updated. See flowsheets/MAR for more info.     Problem: Adult Inpatient Plan of Care  Goal: Optimal Comfort and Wellbeing  Outcome: Progressing  Goal: Readiness for Transition of Care  Outcome: Progressing     Problem: Adult Inpatient Plan of Care  Goal: Plan of Care Review  Outcome: Ongoing - Unchanged  Goal: Patient-Specific Goal (Individualized)  Outcome: Ongoing - Unchanged  Goal: Absence of Hospital-Acquired Illness or Injury  Outcome: Ongoing - Unchanged  Intervention: Identify and Manage Fall Risk  Recent Flowsheet Documentation  Taken 10/11/2022 0800 by Hassie Bruce, RN  Safety Interventions:   aspiration precautions   bleeding precautions   commode/urinal/bedpan at bedside   fall reduction program maintained   family at bedside   infection management   lighting adjusted for tasks/safety   low bed   nonskid shoes/slippers when out of bed   room near unit station  Intervention: Prevent Infection  Recent Flowsheet Documentation  Taken 10/11/2022 0800 by Hassie Bruce, RN  Infection Prevention:   environmental surveillance performed   equipment surfaces disinfected   hand hygiene promoted   personal protective equipment utilized   rest/sleep promoted   single patient room provided  Goal: Rounds/Family Conference  Outcome: Ongoing - Unchanged     Problem: Latex Allergy  Goal: Absence of Allergy Symptoms  Outcome: Ongoing - Unchanged     Problem: Fall Injury Risk  Goal: Absence of Fall and Fall-Related Injury  Outcome: Ongoing - Unchanged  Intervention: Promote Injury-Free Environment  Recent Flowsheet Documentation  Taken 10/11/2022 0800 by Hassie Bruce, RN  Safety Interventions:   aspiration precautions   bleeding precautions   commode/urinal/bedpan at bedside   fall reduction program maintained   family at bedside   infection management   lighting adjusted for tasks/safety   low bed   nonskid shoes/slippers when out of bed   room near unit station     Problem: Skin Injury Risk Increased  Goal: Skin Health and Integrity  Outcome: Ongoing - Unchanged  Intervention: Optimize Skin Protection  Recent Flowsheet Documentation  Taken 10/11/2022 1200 by Hassie Bruce, RN  Head of Bed [facility]) Positioning: HOB at 30-45 degrees  Taken 10/11/2022 0800 by Hassie Bruce, RN  Activity Management:   ambulated to bathroom   back to bed  Pressure Reduction Techniques:   frequent weight shift encouraged   heels elevated off bed   weight shift assistance provided  Head of Bed [facility]) Positioning: HOB at 20-30 degrees  Taken 10/11/2022 0739 by Hassie Bruce, RN  Head of Bed [facility]) Positioning: HOB at 30-45 degrees     Problem: Self-Care Deficit  Goal: Improved Ability to Complete Activities of Daily Living  Outcome: Ongoing - Unchanged

## 2022-10-11 NOTE — Unmapped (Signed)
Department of Anesthesiology  Pain Medicine Division    Chronic Pain Followup Inpatient Consult Note    Requesting Attending Physician:  [name] Filter, MD  Service Requesting Consult:  Med [name] [name])    Assessment/Recommendations:    The patient was seen in consultation on request of [name] Serene, MD regarding assistance with pain management for patient's chronic intractable abdominal pain with now acute on chronic recurrent ileus/NG tube placement.  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      Patient currently established with [facility] pain management [name] Dr. Dyann [name], has been on oxycodone 10 mg 3 times daily, Suboxone 0.5 mg 3 times daily along with baclofen and pregabalin. Overall goal is to wean to buprenorphine and non-opioid medications and eventually off of buprenorphine as well. Through her psychiatrist she is on Ativan 0.5 mg 3 times daily as needed. She has contracted to avoid taking the ativan at the same time or close to when she takes her opioid pain medications.     Patient has had multiple prior inpatient stays either regarding acute exacerbation of chronic pain or ileus.  In the past 12 months she has had 2 infusions of ketamine with limited benefit (last infusion in May 2024) and at one point there was some transaminitis which was thought to be attributed from other etiologies like TPN versus other intra-abdominal pathologies.  In the past patient also has had lidocaine infusions during inpatient stay with limited/variable benefit.        Regarding ileus and her constipation, After discussion with the patient and mother we came to an agreement to attempt with a low-dose of naloxegol 12.5 mg, she has taken enterag around surgery time before and seemed to tolerate it. GI team recommended against neostigmine at this time. We expect the naloxegol to increase her abdominal pain at first but hope that we can continue it and even increase to 25mg  if able.     This is no doubt a very complex situation. Her biggest issue at this point seems to be the ileus. Medication management should focus on non-opioid medications, although she has trialed others in past with side effects or minimal benefit.  Hopefully minimizing opioids and working on her ileus will help get her out of the hospital quicker than previously.  Our goal while admitted would be for her to be able to manage her pain without the need for IV opioids and instead take oxycodone up to 15 mg 4 times daily as needed, with the goal of trying to get as close to oxycodone 10 mg 3 times daily as needed as possible.    Her Suboxone is currently increased from her home dose in an attempt to provide optimal basal control of her pain, favoring an increase in buprenorphine which is a partial mu opioid agonist over use of full mu opioid agonists, which can have worse GI side effect profiles.  Again, ultimately the goal would be to wean off of oxy and manage her pain with buprenorphine alone.     Has had lidocaine gtt this admission with most help. Has been avoiding IV HM with success. Has been having HA's c/w tension type headaches with likely rebound/medication over use component.     Interval: [name] [name] continues to note that her nausea and had to return to low intermittent suction through her NG tube this morning with 150  mL of output.  Unfortunately, her bowels are continuing to be slow to wake up.  This could be multifactorial though slow to resolve narcotic bowel syndrome is high on the differential.  As a result, we will increase the ketamine to 0.25 mg/kg/h of ideal body weight today to optimize global pain control and allow [name] to discontinue as needed oxycodone and as needed IV hydromorphone.  [name] [name] is accepting of this plan and is going to try very hard to avoid all narcotics other than Suboxone today.  She also notes that she felt exhausted after working with both PT and OT yesterday and having multiple guests visit her throughout the day but also notes that she feels very filled having met with so many folks including her pastor and her pastor's wife.    Recommendations:  -The chronic pain service is a consult service and does not place orders, just makes recommendations (except ketamine and lidocaine infusions)   -Please evaluate all patients on opioids for appropriateness of prescribing narcan at discharge.  The chronic pain service can assist with this.  Nasal narcan is covered by most insurances.  -Recommendations given apply to the current hospitalization and do not reflect long term recommendations.    -Patient is on a low dose ketamine infusion for pain   -Ketamine gtt #3 of 4 maximum over a 12 month period     -All ketamine orders will be managed by the [name] only-orders can only be placed by attendings and fellows   -Ketamine requires dedicated IV (PIV or central line lumen)   -Increase ketamine infusion: 0.25 mg/kg/hr Ideal body weight   -Patient location: Step down: dose can increase every 4 hours (can decrease anytime)   -Day 2. Started (date): 8/1   -Prior to starting: Recent EKG, CMP (creatinine, LFTs), urine pregnancy test (if applicable)   -Please add Daily CMP (creatinine and LFTs daily) while running   -Please contact the Chronic Pain service with any questions or concerns about ketamine infusion.    - Patient's neurology send out laboratories have resulted, recommend primary team loop and neurology for assessment and recommendations based off of the results of these send out labs  - Discontinued lidocaine infusion at scheduled time.  09/28/22 - 10/01/22  - Discontinue PRN oxycodone and IV hydromorphone  - Continue IV APAP holiday for concern for rebound headache  - Pt will continue to try to avoid PRN Ativan use  - Pt agreed to trial a holiday of IV Benadryl  - If headaches are not improved from IV Tylenol holiday, consider Fioricet once daily as needed for headaches  - Agree with GI recs of naloxegol 12.5 mg p.o. daily.  GI following   - Continue Baclofen to 10 mg twice daily per NG tube  - Agree with primary team pain and bowel regimen   - Recommend continued physical therapy to work on bed level mobility   - Would recommend speech therapy consult to work on oral desensitization as she has not been able to tolerate anyone in her mouth such as ice chips or small sips of water  - Recommend involvement of inpatient psychology and complex care given patient's distress surrounding hospitalization      We will continue to follow.    Naloxone Rx at discharge?  Is patient on opioids? Yes.  1)Is dose >50MME?  Yes.  2) Is patient prescribed a benzodiazepine (w opioids)? Yes.  3)Hx of overdose?  No.  4) Hx of substance use  disorder? No.  5) Opioids likely to last greater than a week after discharge? Yes.     If yes to 2 or more, prescribe naloxone at discharge.  Nasal narcan for most insured (Nasal narcan 4mg /actuation, prescribe 1 kit, instructions at [email or web address]).  For uninsured, chronic pain can work to assist in finding an option.  OTC nasal narcan now available at most pharmacies for around $45.    Interim History  There were no Acute Events Overnight.  The patient is not obtaining adequate pain relief on current medication regimen and feels that their pain is Somewhat controlled    Today patient reports pain and headaches are globally improved, only mildly however.  Patient had many visitors yesterday and worked with both PT and OT and felt exhausted but fulfilled.    Analgesia Evaluation:  Pain at minimum: 0/10  Pain at maximum: 9/10    Current pain medication regimen (including how frequent PRN's were used):  Suboxone 2-0.5mg  TID   Tylenol IV   Baclofen 5 mg DID   Oxycodone 15mg  q4h PRN (x1)  IV HM 0.5 mg daily prn (x1)    Inpatient Medications  Current Facility-Administered Medications Medication Dose Route Frequency Provider Last Rate Last Admin    baclofen (LIORESAL) tablet 10 mg  10 mg Oral BID Lambert Mody, MD PhD   10 mg at 10/11/22 9629    bisacodyl (DULCOLAX) suppository 10 mg  10 mg Rectal BID Lambert Mody, MD PhD   10 mg at 10/11/22 5284    buprenorphine-naloxone (SUBOXONE) 2-0.5 mg SL film 0.5 mg of buprenorphine  0.25 Film Sublingual TID Lambert Mody, MD PhD   0.5 mg of buprenorphine at 10/11/22 1324    calcium carbonate (TUMS) chewable tablet 400 mg elem calcium  400 mg elem calcium Oral Daily PRN Terri Piedra, AGNP        [Provider Hold] cefdinir (OMNICEF) capsule 300 mg  300 mg Oral Q12H [name] [name] Serene, MD        [Provider Hold] doxycycline (VIBRA-TABS) tablet 100 mg  100 mg Oral BID [name] Serene, MD        famotidine (PF) (PEPCID) injection 20 mg  20 mg Intravenous At bedtime [name] Serene, MD   20 mg at 10/10/22 2007    fat emulsion 20 % (INTRAlipid) infusion 100 mL  100 mL Intravenous Once PRN Lobonc, Ammie Ferrier, MD        fat emulsion 20 % (INTRAlipid) infusion 200 mL  200 mL Intravenous Once PRN Lobonc, Ammie Ferrier, MD        Parenteral Nutrition (CENTRAL)   Intravenous Continuous [name] Filter, MD 45 mL/hr at 10/10/22 2238 New Bag at 10/10/22 2238    And    fat emulsion 20 % with fish oil (SMOFlipid) infusion 250 mL  250 mL Intravenous Continuous [name] Filter, MD 20.8 mL/hr at 10/10/22 2241 250 mL at 10/10/22 2241    Parenteral Nutrition (CENTRAL)   Intravenous Continuous [name] Filter, MD        And    fat emulsion 20 % with fish oil (SMOFlipid) infusion 250 mL  250 mL Intravenous Continuous [name] Filter, MD        ketamine 500 mg in sodium chloride 0.9 % 250 mL (2 mg/mL)  0.25 mg/kg/hr (Ideal) Intravenous Continuous Nat Christen., MD 7.13 mL/hr at 10/11/22 0952 0.25 mg/kg/hr at 10/11/22 0952    LORazepam (ATIVAN) tablet 0.5 mg  0.5 mg  Oral TID PRN Lambert Mody, MD PhD   0.5 mg at 10/11/22 1610    methylnaltrexone (RELISTOR) injection 8 mg  8 mg Subcutaneous Daily Lambert Mody, MD PhD   8 mg at 10/10/22 1221    mirtazapine (REMERON) tablet 15 mg  15 mg Enteral tube: gastric Nightly Lambert Mody, MD PhD   15 mg at 10/10/22 2237    ondansetron (ZOFRAN) injection 4 mg  4 mg Intravenous Q6H PRN Terri Piedra, AGNP   4 mg at 10/11/22 0753    pantoprazole (Protonix) injection 40 mg  40 mg Intravenous BID [name] Serene, MD   40 mg at 10/11/22 0939    phenol (CHLORASEPTIC) 1.4 % spray 2 spray  2 spray Mucous Membrane Q2H PRN [name] Serene, MD   2 spray at 10/03/22 0138    [Provider Hold] polyethylene glycol (MIRALAX) packet 17 g  17 g Oral Daily PRN Terri Piedra, AGNP        promethazine (PHENERGAN) 6.5 mg in sodium chloride (NS) 0.9 % 50 mL IVPB  6.5 mg Intravenous Q6H PRN Suzzanne Cloud, MD   Stopped at 10/11/22 9604    sodium chloride (NS) 0.9 % infusion  10 mL/hr Intravenous Continuous Nat Christen., MD        sodium chloride 0.45% (1/2 NS) infusion  100 mL/hr Intravenous Continuous [name], MD 100 mL/hr at 10/11/22 0321 100 mL/hr at 10/11/22 0321         Objective:     Vital Signs    Temp:  [36.5 ??C (97.7 ??F)-36.9 ??C (98.4 ??F)] 36.6 ??C (97.8 ??F)  Heart Rate:  [64-84] 64  SpO2 Pulse:  [62-75] 62  Resp:  [17-20] 20  BP: (93-109)/(41-77) 109/51  MAP (mmHg):  [59-84] 69  SpO2:  [99 %-100 %] 100 %      Physical Exam     GENERAL:  Well developed, well-nourished female and looking uncomfortable from pain and NG tube discomfort. Sitting up in bed and appearing significantly more comfortable today  HEAD/NECK:    Reveals normocephalic/atraumatic. NGT present, hoarse voice  CARDIOVASCULAR:   WWP, regular tate  LUNGS:   Normal work of breathing, [place]  EXTREMITIES:  Warm, no clubbing, cyanosis, or edema was noted.  NEUROLOGIC:    The patient was alert and oriented times four with normal language, attention, cognition and memory. Cranial nerve exam was grossly normal.    MUSCULOSKELETAL:    SKIN:  No obvious rashes lesions or erythema  PSY:  Appropriate affect and mood.    Test Results    Lab Results   Component Value Date    CREATININE 0.42 (L) 10/11/2022     Lab Results   Component Value Date    ALKPHOS 72 10/05/2022    BILITOT 0.2 (L) 10/05/2022    BILIDIR <0.10 08/08/2022    PROT 5.8 10/05/2022    ALBUMIN 3.1 (L) 10/05/2022    ALT 20 10/05/2022    AST 20 10/05/2022           Problem List    Principal Problem:    Other acute postprocedural pain  Active Problems:    [name]    Intestinal polyps    [name]    Neoplasm related pain    Physical deconditioning    History of colectomy    Intractable abdominal pain    Generalized anxiety disorder with panic attacks    SBO (small bowel obstruction) (CMS-HCC)  Small intestinal bacterial overgrowth (SIBO)      Nat Christen, MD  Assistant Professor of Anesthesiology  Department of Anesthesiology   Divisions of General Anesthesiology and Pain Medicine     Medical Decision Making    Problems Addressed:  1 or more chronic illnesses with severe exacerbation, progression, or side effects of treatment (high)    Amount/Complexity:  Reviewed external records: Primary team progress note  Reviewed results: 8/3 BMP   Ordered tests: none  Assessment requiring independent historian: patient's mother  Discussion of management/test results with medical professional: Primary team  Independent interpretation of test: none    Risk  High: Drug therapy requiring intensive monitoring for toxicity, Decision regarding hospitalization, Elective Major Surgery w/ risk factors, Emergency Major Surgery, Decision to DNR or de-escalate care because of poor prognosis, parental controlled substances (IV ketamine and IV opioids)

## 2022-10-11 NOTE — Unmapped (Signed)
VENOUS ACCESS TEAM PROCEDURE    Nurse request was placed for a "PIV by Venous Access Team (VAT)".  Patient was assessed at bedside for placement of a PIV. PPE were donned per protocol.  Access was obtained. Blood return noted.  Dressing intact and device well secured.  Flushed with normal saline.  See LDA for details.  Pt advised to inform RN of any s/s of discomfort at the PIV site.    Workup / Procedure Time:  15 minutes       CARE RN was notified.       Thank you,     Tahiry Spicer, RN Venous Access Team

## 2022-10-11 NOTE — Unmapped (Signed)
VENOUS ACCESS ULTRASOUND PROCEDURE NOTE    Indications:   Poor venous access.    The Venous Access Team has assessed this patient for the placement of a PIV. Ultrasound guidance was necessary to obtain access.     Procedure Details:  Identity of the patient was confirmed via name, medical record number and date of birth. The availability of the correct equipment was verified.    The vein was identified for ultrasound catheter insertion.  Field was prepared with necessary supplies and equipment.  Probe cover and sterile gel utilized.  Insertion site was prepped with chlorhexidine solution and allowed to dry.  The catheter extension was primed with normal saline. A(n) 22 gauge 1.75 catheter was placed in the L Forearm with 1attempt(s). See LDA for additional details.    Catheter aspirated, 4 mL blood return present. The catheter was then flushed with 10 mL of normal saline. Insertion site cleansed, and dressing applied per manufacturer guidelines. The catheter was inserted with difficulty due to poor vasculature by Jacqulyn Liner, RN.     Care RN was notified.     Thank you,     Jacqulyn Liner, RN Venous Access Team   [phone]     Workup / Procedure Time:  30 minutes    See images below:

## 2022-10-11 NOTE — Unmapped (Addendum)
Daily Progress Note    Assessment/Plan:    Principal Problem:    Other acute postprocedural pain  Active Problems:    [name]    Intestinal polyps    [name]    Neoplasm related pain    Physical deconditioning    History of colectomy    Intractable abdominal pain    Generalized anxiety disorder with panic attacks    SBO (small bowel obstruction) (CMS-HCC)    Small intestinal bacterial overgrowth (SIBO)  Resolved Problems:    * No resolved hospital problems. 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    Ileus, multifactorial but with component of narcotic bowel, ongoing  Inability to tolerate PO, without diagnosis of malnutrition  Continues to have very small amount of stool output and flatus; bowel sounds remain hypoactive on exam today. Able to tolerate NG clamping most of the time but still intermittently requiring LIS for nausea. GI previously following, now signed off. Discussed with Chronic Pain provider [name] today 8/3. Patient and mom are both amenable to continuing ketamine infusion and completely weaning off opioids to help treat narcotic bowel.   - Appreciate Chronic Pain recommendations  - NPO except sips/chips for comfort; would not advance diet until return of bowel function  - NG tube clamped; would not place to LIS unless having severe/intractable nausea/vomiting   - Patient needs to be able to swallow PO meds to be able to have NG tube removed  - continue TPN for nutritional support, reorder daily  - HOLDING naloxegol --> continue trial of Relistor daily since 7/31 in case of decreased absorption of naloxegol or ongoing use of NG tube limiting effect  - continue bowel regimen, needs extensive regimen including twice daily enema or suppository  - continue ketamine infusion (8/1 - ), increase dose to 0.25 mg/kg/hr per Chronic Pain  - discontinue oxycodone and hydromorphone; can consider one-time only doses only for severe pain but patient agrees to try to minimize.     Headache  Minimal improvement with IV fluids, magnesium, IV Benadryl. Concerning for rebound/medication overuse headache  - Continue Tylenol holiday (last dose 7/29)  - Consider adding Fioricet daily prn if no improvement with Tylenol holiday  - Working to minimize opioids; avoid NSAIDs, avoid IV Benadryl    Syncope, uncertain etiology:  Possibly vasovagal, but either exacerbated (if not caused) by medication effects   - adjust benzos, opiates, other potentially vasoactive meds as tolerated   - broaden eval if recurrent, noting this has been issue previously during hospitalization     Muscle spasms and diffuse pain, uncertain etiology  Occurred post-procedurally, with reassuring evaluation (MRI spine, neuro c/s, CK normal).  Paraneoplastic panel resulted with weakly positive calcium channel binding P/Q.  Discussed with neurology, and felt to be nonspecific and clinically insignificant finding related to her underlying history of desmoid tumor and does not explain her muscle spasms.  - Appreciate Chronic Pain recommendations  - continue baclofen 10 mg BID (increased 7/31)  - continue ativan TID prn (PTA med), but caution against escalating (  noting risks of concurrent benzos + opiates, possibility of contribution to syncope and other symptoms)    Coffee-ground emesis  Noted in NGT 7/21 and 7/31. Most likely due to NG tube trauma and has prior hx of ulcers iso anticoagulation. Hgb stable 9.5-10. GI consulted and recommended conservative management  - NPO pending return of bowel function  - NG tube clamped - continue IV PPI twice daily    Chronic/stable/resolved problems:  [name]: hold nirogacestat   SIBO: hold home cefdinir, doxy  Desmoid tumors: resume nirogacestat as outpatient, follow with GI, VIR for steroid injections     FEN: NPO except sips/chips for comfort, TPN.   VTE ppx: ambulation  Code status: Full code  Dispo: home with HH PT/OT/TPN vs SNF pending resolution of ileus and successful removal of NG tube    I personally spent 50 minutes face-to-face and non-face-to-face in the care of this patient, which includes all pre, intra, and post visit time on the date of service.  All documented time was specific to the E/M visit and does not include any procedures that may have been performed.  ___________________________________________________________________    Subjective:  No acute events overnight.  Still having nausea and required NG tube to low intermittent suction briefly overnight with 150 cc output.  Has not attempted to take her meds by mouth due to nausea.  He used oxycodone x 1 and hydromorphone x 1 yesterday; however willing to avoid opioids moving forward as we increase her ketamine drip.  Had very small amount of yellowish stool with suppository yesterday, but otherwise no BM.  Intermittently passing flatus.  Wonders why her muscles shake after she ambulates for certain distance and wonders why she continues to have dyspnea and feel that she cannot take deep breaths.    Labs/Studies:  Labs per EMR and Reviewed (last 24hrs)    Objective:  BP 114/72  - Pulse 80  - Temp 36.7 ??C (98 ??F) (Oral)  - Resp 20  - Ht 165.1 cm (5' 5)  - Wt 60.2 kg (132 lb 11.5 oz)  - SpO2 99%  - BMI 22.09 kg/m??     GEN: More well-appearing, sitting up in bed, mom at bedside  HEENT: NG tube in place. Hypophonic voice  CV: RRR, nl S1 S2 no m/r/g  ABD: Soft, mildly distended, hypoactive bowel sounds, only heard briefly in LLQ  EXT: WWP, no edema. Some dry flaking skin on left dorsal hand without erythema or vesicles. NEURO: No focal deficits  PSYCH: appropriate affect

## 2022-10-11 NOTE — Unmapped (Signed)
Speech Language Pathology Clinical Swallow Assessment  Evaluation (10/11/22 1400)    Patient Name:  [name] [name]       Medical Record Number: [id number]   Date of Birth: 2000-08-07  Sex: Female            SLP Treatment Diagnosis: Dysphonia  dysphonia; r/o dysphagia  Activity Tolerance: Patient tolerated treatment well    Assessment  Pt without overt s/sx aspiration on small sips of water noted today.  Pt is cautious about amount of pos she intakes because of her nausea.  Ileus with NGT set to intermittent suction.  Per pt/mom at the bedside, bowels are working, but very slowly.  As pt able to intake pos from a GI standpoint/ileus/medical standpoint, she can be advance to a regular diet with thin liquids consistency given no significant oromotor weakness or aspiration s/sx noted.  Pt reports recent aspiration event and suspect her aspiration risk is likely to fluctuate with her GI issues, positioning, and built up secretions.  D/w pt her voice quality, which is acutely changed.  Voice is moderately breathy hoarse with decreased volume.  Recent coughing and questionable aspiration/reflux and/or vocal cord irritation likely contributing to this.  Overall, pt with decreased breath support, which is likely contributing to her decreased volume.  Pt would likely from strengthening both respiratory and vocal muscles in an effort to improve her voice and potentially subjective swallow experience.  Pt in agreement with SLP targeting breath support and voice fxn.        Recommendations:  PO Diet, Consider alternative nutrition (continued use of TPN as medically appropriate as do not anticipate pt will meet nutritional needs via pos alone at this time)      Follow-up Referral Recommendations : Skilled SLP services to treat dysphagia and address aspiration risk factors    Diet Liquids Recommendations: Thin Liquids, Level 0, No Restrictions    Diet Solids Recommendation: Regular Consistency Solids (advanced as medically appropriate from a GI standpoint)    Recommended Form of Medications: Whole, Crushed, With liquid, With puree, Feeding tube (as medically appropriate or as pt prefers)      Recommended Compensatory Techniques : Slow rate, Small sips/bites, Upright 90 degrees, HOB>30 degrees at all times, Upright 30 min after meal    Post Acute Discharge Recommendations  Post Acute SLP Discharge Recommendations: Skilled SLP services indicated, 3x weekly    Prognosis: Guarded  Positive Indicators: age, motivated  Barriers to Discharge: Gait instability, Impaired Balance, Endurance deficits, Inaccessible home environment, Functional strength deficits, Inability to safely perform ADLS     Plan of Care  SLP Follow-up / Frequency: 1x per day, 1-2x week  Planned Treatment Duration : 10/25/22    Treatment Goals:                                                   Planned Interventions: Compensatory Strategy Training, Resonant Voice Therapy        Patient and Family Goal: get voice stronger; resume pos for comfort    Subjective  Medical Updates Since Last Visit/Relevant PMH Affecting Clinical Decision Making: [name] is a 22 year old woman 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          Other Prior Function Comments: ongoing medical difficulties with decreased po intake and malnutrition per chart          Communication Preference: Verbal  Patient/Caregiver Reports: pt and mom at the bedside report recent aspiration event and voice changes related to that and ongoing coughing spells; note able to swallow- but increases her nausea  Pain: no s/s of pain           Allergies: Adhesive tape-silicones; Ferrlecit [sodium ferric gluconat-sucrose]; Levofloxacin; Methylnaltrexone; Neomycin; Papaya; Morphine; Zosyn [piperacillin-tazobactam]; Compazine [prochlorperazine]; Iron analogues; Iron dextran; and Latex, natural rubber  Current Facility-Administered Medications   Medication Dose Route Frequency Provider Last Rate Last Admin    baclofen (LIORESAL) tablet 10 mg  10 mg Oral BID Lambert Mody, MD PhD   10 mg at 10/11/22 9528    bisacodyl (DULCOLAX) suppository 10 mg  10 mg Rectal BID Lambert Mody, MD PhD   10 mg at 10/11/22 4132    buprenorphine-naloxone (SUBOXONE) 2-0.5 mg SL film 0.5 mg of buprenorphine  0.25 Film Sublingual TID Lambert Mody, MD PhD   0.5 mg of buprenorphine at 10/11/22 1345    calcium carbonate (TUMS) chewable tablet 400 mg elem calcium  400 mg elem calcium Oral Daily PRN Terri Piedra, AGNP        [Provider Hold] cefdinir (OMNICEF) capsule 300 mg  300 mg Oral Q12H [facility] Rocco Serene, MD        [Provider Hold] doxycycline (VIBRA-TABS) tablet 100 mg  100 mg Oral BID Rocco Serene, MD        famotidine (PF) (PEPCID) injection 20 mg  20 mg Intravenous At bedtime Rocco Serene, MD   20 mg at 10/10/22 2007    fat emulsion 20 % (INTRAlipid) infusion 100 mL  100 mL Intravenous Once PRN Lobonc, Ammie Ferrier, MD        fat emulsion 20 % (INTRAlipid) infusion 200 mL  200 mL Intravenous Once PRN Lobonc, Ammie Ferrier, MD        Parenteral Nutrition (CENTRAL)   Intravenous Continuous Arman Filter, MD        And    fat emulsion 20 % with fish oil (SMOFlipid) infusion 250 mL  250 mL Intravenous Continuous Arman Filter, MD        ketamine 500 mg in sodium chloride 0.9 % 250 mL (2 mg/mL)  0.25 mg/kg/hr (Ideal) Intravenous Continuous Nat Christen., MD 7.13 mL/hr at 10/11/22 0952 0.25 mg/kg/hr at 10/11/22 4401    LORazepam (ATIVAN) tablet 0.5 mg  0.5 mg Oral TID PRN Lambert Mody, MD PhD   0.5 mg at 10/11/22 0753    methylnaltrexone (RELISTOR) injection 8 mg  8 mg Subcutaneous Daily Lambert Mody, MD PhD   8 mg at 10/11/22 1255    mirtazapine (REMERON) tablet 15 mg  15 mg Enteral tube: gastric Nightly Lambert Mody, MD PhD   15 mg at 10/10/22 2237    ondansetron (ZOFRAN) injection 4 mg  4 mg Intravenous Q6H PRN Terri Piedra, AGNP   4 mg at 10/11/22 0753    pantoprazole (Protonix) injection 40 mg  40 mg Intravenous BID Rocco Serene, MD   40 mg at 10/11/22 0272    Parenteral Nutrition (CENTRAL)   Intravenous Continuous Arman Filter, MD 45 mL/hr at 10/10/22 2238 New Bag at 10/10/22 2238    phenol (  CHLORASEPTIC) 1.4 % spray 2 spray  2 spray Mucous Membrane Q2H PRN Rocco Serene, MD   2 spray at 10/03/22 0138    [Provider Hold] polyethylene glycol (MIRALAX) packet 17 g  17 g Oral Daily PRN Terri Piedra, AGNP        promethazine (PHENERGAN) 6.5 mg in sodium chloride (NS) 0.9 % 50 mL IVPB  6.5 mg Intravenous Q6H PRN Suzzanne Cloud, MD   Stopped at 10/11/22 1231    sodium chloride (NS) 0.9 % infusion  10 mL/hr Intravenous Continuous Nat Christen., MD        sodium chloride 0.45% (1/2 NS) infusion  100 mL/hr Intravenous Continuous [name], MD 100 mL/hr at 10/11/22 1345 100 mL/hr at 10/11/22 1345    zinc oxide-cod liver oil (DESITIN 40%) Paste   Topical Daily PRN Arman Filter, MD         Past Medical History:   Diagnosis Date    Abdominal pain     Acid reflux     occas    Anesthesia complication     itching, shaking, coldness; last few surgeries have gone much better    Cancer (CMS-HCC)     Cataract of right eye     COVID-19 virus infection 01/2019    Cyst of thyroid determined by ultrasound     monitoring    [name]     2 right forearm, 1 left thigh, 1 right scapula, 1 under left clavicle; multiple    Difficult intravenous access     [name] (familial adenomatous polyposis)     [name]     Gastric polyps     History of chemotherapy     last treatment approx 05/2019    History of colon polyps     History of COVID-19 01/2019 Iron deficiency anemia due to chronic blood loss     received iron infusion 11-2019    PONV (postoperative nausea and vomiting)     Rectal bleeding     Syncopal episodes     especially if becoming dehydrated     Family History   Problem Relation Age of Onset    No Known Problems Mother     No Known Problems Father     No Known Problems Sister     No Known Problems Brother     Stroke Maternal Grandmother     Other Maternal Grandmother         benign lesions of liver and pancreas, further details unknown    Cancer Maternal Grandmother     Diabetes Maternal Grandmother     Hypertension Maternal Grandmother     Thyroid disease Maternal Grandmother     Arthritis Maternal Grandfather     Asthma Maternal Grandfather     COPD Paternal Grandmother         Deceased    Miscarriages / Stillbirths Paternal Grandmother     Alcohol abuse Paternal Grandfather         Deceased    No Known Problems Maternal Aunt     No Known Problems Maternal Uncle     No Known Problems Paternal Aunt     No Known Problems Paternal Uncle     Anesthesia problems Neg Hx     Broken bones Neg Hx     Cancer Neg Hx     Clotting disorder Neg Hx     Collagen disease Neg Hx     Diabetes Neg Hx  Dislocations Neg Hx     Fibromyalgia Neg Hx     Gout Neg Hx     Hemophilia Neg Hx     Osteoporosis Neg Hx     Rheumatologic disease Neg Hx     Scoliosis Neg Hx     Severe sprains Neg Hx     Sickle cell anemia Neg Hx     Spinal Compression Fracture Neg Hx     Melanoma Neg Hx     Basal cell carcinoma Neg Hx     Squamous cell carcinoma Neg Hx      Past Surgical History:   Procedure Laterality Date    COLON SURGERY      cyroablation      cystis removal      desmoid removal      PR CLOSE ENTEROSTOMY,RESEC+ANAST N/A 10/09/2020    Procedure: ILEOSTOMY TAKEDOWN;  Surgeon: Mickle Asper, MD;  Location: OR [place];  Service: General Surgery    PR COLONOSCOPY W/BIOPSY SINGLE/MULTIPLE N/A 10/27/2012    Procedure: COLONOSCOPY, FLEXIBLE, PROXIMAL TO SPLENIC FLEXURE; WITH BIOPSY, SINGLE OR MULTIPLE;  Surgeon: Shirlyn Goltz Mir, MD;  Location: PEDS PROCEDURE ROOM [place];  Service: Gastroenterology    PR COLONOSCOPY W/BIOPSY SINGLE/MULTIPLE N/A 09/14/2013    Procedure: COLONOSCOPY, FLEXIBLE, PROXIMAL TO SPLENIC FLEXURE; WITH BIOPSY, SINGLE OR MULTIPLE;  Surgeon: Shirlyn Goltz Mir, MD;  Location: PEDS PROCEDURE ROOM [place];  Service: Gastroenterology    PR COLONOSCOPY W/BIOPSY SINGLE/MULTIPLE N/A 11/08/2014    Procedure: COLONOSCOPY, FLEXIBLE, PROXIMAL TO SPLENIC FLEXURE; WITH BIOPSY, SINGLE OR MULTIPLE;  Surgeon: Arnold Long Mir, MD;  Location: PEDS PROCEDURE ROOM [facility];  Service: Gastroenterology    PR COLONOSCOPY W/BIOPSY SINGLE/MULTIPLE N/A 12/26/2015    Procedure: COLONOSCOPY, FLEXIBLE, PROXIMAL TO SPLENIC FLEXURE; WITH BIOPSY, SINGLE OR MULTIPLE;  Surgeon: Arnold Long Mir, MD;  Location: PEDS PROCEDURE ROOM [facility];  Service: Gastroenterology    PR COLONOSCOPY W/BIOPSY SINGLE/MULTIPLE N/A 09/02/2017    Procedure: COLONOSCOPY, FLEXIBLE, PROXIMAL TO SPLENIC FLEXURE; WITH BIOPSY, SINGLE OR MULTIPLE;  Surgeon: Arnold Long Mir, MD;  Location: PEDS PROCEDURE ROOM [place];  Service: Gastroenterology    PR COLSC FLX W/REMOVAL LESION BY HOT BX FORCEPS N/A 08/27/2016    Procedure: COLONOSCOPY, FLEXIBLE, PROXIMAL TO SPLENIC FLEXURE; W/REMOVAL TUMOR/POLYP/OTHER LESION, HOT BX FORCEP/CAUTE;  Surgeon: Arnold Long Mir, MD;  Location: PEDS PROCEDURE ROOM [facility];  Service: Gastroenterology    PR COLSC FLX W/RMVL OF TUMOR POLYP LESION SNARE TQ N/A 02/25/2019    Procedure: COLONOSCOPY FLEX; W/REMOV TUMOR/LES BY SNARE;  Surgeon: Helyn Numbers, MD;  Location: GI PROCEDURES [place] [facility];  Service: Gastroenterology    PR COLSC FLX W/RMVL OF TUMOR POLYP LESION SNARE TQ N/A 03/13/2020    Procedure: COLONOSCOPY FLEX; W/REMOV TUMOR/LES BY SNARE;  Surgeon: Helyn Numbers, MD;  Location: [facility] PROCEDURES [place] [facility];  Service: Gastroenterology    PR EXC SKIN BENIG 2.1-3 CM TRUNK,ARM,LEG Right 02/25/2017 Procedure: EXCISION, BENIGN LESION INCLUDE MARGINS, EXCEPT SKIN TAG, LEGS; EXCISED DIAMETER 2.1 TO 3.0 CM;  Surgeon: Clarene Duke, MD;  Location: CHILDRENS OR [facility]  Service: Plastics    PR EXC SKIN BENIG 3.1-4 CM TRUNK,ARM,LEG Right 02/25/2017    Procedure: EXCISION, BENIGN LESION INCLUDE MARGINS, EXCEPT SKIN TAG, ARMS; EXCISED DIAMETER 3.1 TO 4.0 CM;  Surgeon: Clarene Duke, MD;  Location: CHILDRENS OR [facility];  Service: Plastics    PR EXC SKIN BENIG >4 CM FACE,FACIAL Right 02/25/2017    Procedure: EXCISION, OTHER BENIGN LES INCLUD MARGIN, FACE/EARS/EYELIDS/NOSE/LIPS/MUCOUS MEMBRANE; EXCISED DIAM >4.0 CM;  Surgeon: Clarene Duke, MD;  Location: CHILDRENS OR [facility];  Service: Plastics    PR EXC TUMOR SOFT TISSUE LEG/ANKLE SUBQ 3+CM Right 08/05/2019    Procedure: EXCISION, TUMOR, SOFT TISSUE OF LEG OR ANKLE AREA, SUBCUTANEOUS; 3 CM OR GREATER;  Surgeon: Arsenio Katz, MD;  Location: MAIN OR [place];  Service: Plastics    PR EXC TUMOR SOFT TISSUE LEG/ANKLE SUBQ <3CM Right 08/05/2019    Procedure: EXCISION, TUMOR, SOFT TISSUE OF LEG OR ANKLE AREA, SUBCUTANEOUS; LESS THAN 3 CM;  Surgeon: Arsenio Katz, MD;  Location: MAIN OR [facility];  Service: Plastics    PR LAP, SURG PROCTECTOMY W J-POUCH N/A 08/10/2020    Procedure: ROBOTIC ASSISTED LAPAROSCOPIC PROCTOCOLECTOMY, ILEAL J POUCH, WITH OSTOMY;  Surgeon: Mickle Asper, MD;  Location: OR [place];  Service: General Surgery    PR NDSC EVAL INTSTINAL POUCH DX W/COLLJ SPEC SPX N/A 01/23/2021    Procedure: ENDO EVAL SM INTEST POUCH; DX;  Surgeon: Modena Nunnery, MD;  Location: [facility] [place] [facility];  Service: Gastroenterology    PR NDSC EVAL INTSTINAL POUCH DX W/COLLJ SPEC SPX N/A 08/27/2021    Procedure: ENDO EVAL SM INTEST POUCH; DX;  Surgeon: Hunt Oris, MD;  [facility];  Service: Gastroenterology    PR NDSC EVAL INTSTINAL POUCH DX W/COLLJ SPEC SPX N/A 12/09/2021    Procedure: ENDO EVAL SM INTEST POUCH; DX;  Surgeon: Vidal Schwalbe, MD;  [facility]: [facility] PROCEDURES [facility];  Service: Gastroenterology    PR NDSC EVAL INTSTINAL POUCH DX W/COLLJ [facility] SPX Left 04/09/2022    Procedure: ENDO EVAL SM INTEST POUCH; DX;  Surgeon: Modena Nunnery, MD;  Location: [facility] [place] [facility];  Service: Gastroenterology    PR NDSC EVAL INTSTINAL POUCH DX W/COLLJ SPEC SPX N/A 08/05/2022    Procedure: ENDO EVAL SM INTEST POUCH; DX;  Surgeon: Modena Nunnery, MD;  Location: GI PROCEDURES [facility];  Service: Gastroenterology    PR NDSC EVAL INTSTINAL POUCH W/BX SINGLE/MULTIPLE N/A 01/20/2022    Procedure: ENDOSCOPIC EVAL OF SMALL INTESTINAL POUCH; DIAGNOSTIC, No biopsies;  Surgeon: Andrey Farmer, MD;  [facility]: GI PROCEDURES [facility];  Service: Gastroenterology    PR NDSC EVAL INTSTINAL POUCH W/BX SINGLE/MULTIPLE N/A 02/13/2022    Procedure: ENDOSCOPIC EVAL OF SMALL INTESTINAL POUCH; DIAGNOSTIC, WITH BIOPSY;  Surgeon: Bronson Curb, MD;  Location: GI PROCEDURES [facility];  Service: Gastroenterology    PR UNLISTED PROCEDURE SMALL INTESTINE  01/23/2021    Procedure: UNLISTED PROCEDURE, SMALL INTESTINE;  Surgeon: Modena Nunnery, MD;  Location: [facility] PROC[facility] [place] [facility];  Service: Gastroenterology    PR UNLISTED PROCEDURE SMALL INTESTINE  02/13/2022    Procedure: UNLISTED PROCEDURE, SMALL INTESTINE;  Surgeon: Bronson Curb, MD;  Location: GI PROCEDURES [facility];  Service: Gastroenterology    PR UPPER GI ENDOSCOPY,BIOPSY N/A 10/27/2012    Procedure: UGI ENDOSCOPY; WITH BIOPSY, SINGLE OR MULTIPLE;  Surgeon: Shirlyn Goltz Mir, MD;  Location: PEDS PROCEDURE ROOM [facility];  Service: Gastroenterology    PR UPPER GI ENDOSCOPY,BIOPSY N/A 09/14/2013    Procedure: UGI ENDOSCOPY; WITH BIOPSY, SINGLE OR MULTIPLE;  Surgeon: Shirlyn Goltz Mir, MD;  Location: PEDS PROCEDURE ROOM [facility];  Service: Gastroenterology    PR UPPER GI ENDOSCOPY,BIOPSY N/A 11/08/2014    Procedure: UGI ENDOSCOPY; WITH BIOPSY, SINGLE OR MULTIPLE;  Surgeon: Arnold Long Mir, MD;  Location: PEDS PROCEDURE ROOM [facility];  Service: Gastroenterology    PR UPPER GI ENDOSCOPY,BIOPSY N/A 12/26/2015    Procedure:  UGI ENDOSCOPY; WITH BIOPSY, SINGLE OR MULTIPLE;  Surgeon: Arnold Long Mir, MD;  Location: PEDS PROCEDURE ROOM [facility];  Service: Gastroenterology    PR UPPER GI ENDOSCOPY,BIOPSY N/A 08/27/2016    Procedure: UGI ENDOSCOPY; WITH BIOPSY, SINGLE OR MULTIPLE;  Surgeon: Arnold Long Mir, MD;  Location: PEDS PROCEDURE ROOM [facility];  Service: Gastroenterology    PR UPPER GI ENDOSCOPY,BIOPSY N/A 09/02/2017    Procedure: UGI ENDOSCOPY; WITH BIOPSY, SINGLE OR MULTIPLE;  Surgeon: Arnold Long Mir, MD;  Location: PEDS PROCEDURE ROOM [facility];  Service: Gastroenterology    PR UPPER GI ENDOSCOPY,BIOPSY N/A 03/13/2020    Procedure: UGI ENDOSCOPY; WITH BIOPSY, SINGLE OR MULTIPLE;  Surgeon: Helyn Numbers, MD;  Location: [facility] [place] [facility];  Service: Gastroenterology    PR UPPER GI ENDOSCOPY,BIOPSY N/A 09/05/2021    Procedure: UGI ENDOSCOPY; WITH BIOPSY, SINGLE OR MULTIPLE;  Surgeon: Wendall Papa, MD;  Location: GI PROCEDURES [facility];  Service: Gastroenterology    PR UPPER GI ENDOSCOPY,DIAGNOSIS N/A 01/20/2022    Procedure: UGI ENDO, INCLUDE ESOPHAGUS, STOMACH, & DUODENUM &/OR JEJUNUM; DX W/WO COLLECTION SPECIMN, BY BRUSH OR WASH;  Surgeon: Andrey Farmer, MD;  Location: GI PROCEDURES [facility];  Service: Gastroenterology    TUMOR REMOVAL      multiple-head, neck, back, hand, right flank, multiple     Social History     Tobacco Use    Smoking status: Never     Passive exposure: Past    Smokeless tobacco: Never   Substance Use Topics    Alcohol use: Never         General:                    Self-Feeding Capacity: Functional for self-feeding                      Equipment/Environment: Telemetry, NGT, Colostomy, Vascular access (PIV, TLC, Port-a-cath, PICC  Services patient receives prior to admission: SLP    Precautions / Restrictions  Precautions: Falls precautions, Aspiration precautions  Weight Bearing Status: Non-applicable  Required Braces or Orthoses: Non-applicable    Objective  Temperature Spikes Noted: No  Respiratory Status : Room air  History of Intubation: No          Behavior/Cognition: Alert, Cooperative, Pleasant mood  Positioning : Upright in bed    Oral / Motor Exam  Vocal Quality: Dysphonic, Weak (moderately breathy hoarse voice with decreased volume)  Volitional Swallow: Within Functional Limits   Labial ROM: Within Functional Limits   Labial Symmetry: Within Functional Limits  Labial Strength: Within Functional Limits   Lingual ROM: Within Functional Limits  Lingual Symmetry: Within Functional Limits      Lingual Sensation: Within Functional Limits      Mandible: Within Functional Limits  Coordination: functional  Facial ROM: Within Functional Limits   Facial Symmetry: Within Functional Limits  Facial Strength: Within Functional Limits  Facial Sensation: Within Functional Limits   Vocal Intensity: Moderately decreased   Gag: Within Functional Limits       Dysarthria: None present   Intelligibility: Intelligible   Breath Support: Adequate for speech   Dentition: Adequate    Consistencies assessed:      Patient at end of session: All needs in reach, In bed, Friends/Family present, Nurse notified         Speech Therapy Session Duration  SLP Individual [mins]: 30    I attest that I have reviewed the above information.  Signed: Donnamae Jude,  CCC-SLP    Filed 10/11/2022

## 2022-10-12 LAB — COMPREHENSIVE METABOLIC PANEL
ALBUMIN: 3.1 g/dL — ABNORMAL LOW (ref 3.4–5.0)
ALKALINE PHOSPHATASE: 58 U/L (ref 46–116)
ALT (SGPT): 12 U/L (ref 10–49)
ANION GAP: 6 mmol/L (ref 5–14)
AST (SGOT): 14 U/L (ref <=34)
BILIRUBIN TOTAL: 0.3 mg/dL (ref 0.3–1.2)
BLOOD UREA NITROGEN: 13 mg/dL (ref 9–23)
BUN / CREAT RATIO: 28
CALCIUM: 9 mg/dL (ref 8.7–10.4)
CHLORIDE: 108 mmol/L — ABNORMAL HIGH (ref 98–107)
CO2: 30 mmol/L (ref 20.0–31.0)
CREATININE: 0.47 mg/dL — ABNORMAL LOW
EGFR CKD-EPI (2021) FEMALE: >90 mL/min/{1.73_m2} (ref >=60)
GLUCOSE RANDOM: 100 mg/dL (ref 70–179)
POTASSIUM: 3.7 mmol/L (ref 3.4–4.8)
PROTEIN TOTAL: 5.8 g/dL (ref 5.7–8.2)
SODIUM: 144 mmol/L (ref 135–145)

## 2022-10-12 LAB — PHOSPHORUS: PHOSPHORUS: 4.5 mg/dL (ref 2.4–5.1)

## 2022-10-12 LAB — MAGNESIUM: MAGNESIUM: 2 mg/dL (ref 1.6–2.6)

## 2022-10-12 MED ADMIN — LORazepam (ATIVAN) tablet 0.5 mg: .5 mg | ORAL | @ 13:00:00

## 2022-10-12 MED ADMIN — sodium chloride 0.45% (1/2 NS) infusion: 100 mL/h | INTRAVENOUS | @ 13:00:00

## 2022-10-12 MED ADMIN — methylnaltrexone (RELISTOR) injection 8 mg: 8 mg | SUBCUTANEOUS | @ 16:00:00

## 2022-10-12 MED ADMIN — baclofen (LIORESAL) tablet 10 mg: 10 mg | ORAL | @ 01:00:00

## 2022-10-12 MED ADMIN — ketamine 500 mg in sodium chloride 0.9 % 250 mL (2 mg/mL): .25 mg/kg/h | INTRAVENOUS | Stop: 2022-10-16

## 2022-10-12 MED ADMIN — sodium chloride 0.45% (1/2 NS) infusion: 100 mL/h | INTRAVENOUS | @ 03:00:00

## 2022-10-12 MED ADMIN — sodium chloride 0.45% (1/2 NS) infusion: 100 mL/h | INTRAVENOUS | @ 23:00:00

## 2022-10-12 MED ADMIN — buprenorphine-naloxone (SUBOXONE) 2-0.5 mg SL film 0.5 mg of buprenorphine: .25 | SUBLINGUAL | @ 19:00:00

## 2022-10-12 MED ADMIN — promethazine (PHENERGAN) 6.5 mg in sodium chloride (NS) 0.9 % 50 mL IVPB: 6.5 mg | INTRAVENOUS | @ 12:00:00

## 2022-10-12 MED ADMIN — ondansetron (ZOFRAN) injection 4 mg: 4 mg | INTRAVENOUS | @ 03:00:00

## 2022-10-12 MED ADMIN — buprenorphine-naloxone (SUBOXONE) 2-0.5 mg SL film 0.5 mg of buprenorphine: .25 | SUBLINGUAL | @ 01:00:00

## 2022-10-12 MED ADMIN — ondansetron (ZOFRAN) injection 4 mg: 4 mg | INTRAVENOUS | @ 23:00:00

## 2022-10-12 MED ADMIN — ondansetron (ZOFRAN) injection 4 mg: 4 mg | INTRAVENOUS | @ 17:00:00

## 2022-10-12 MED ADMIN — bisacodyl (DULCOLAX) suppository 10 mg: 10 mg | RECTAL | @ 13:00:00

## 2022-10-12 MED ADMIN — promethazine (PHENERGAN) 6.5 mg in sodium chloride (NS) 0.9 % 50 mL IVPB: 6.5 mg | INTRAVENOUS | @ 19:00:00

## 2022-10-12 MED ADMIN — pantoprazole (Protonix) injection 40 mg: 40 mg | INTRAVENOUS | @ 01:00:00

## 2022-10-12 MED ADMIN — buprenorphine-naloxone (SUBOXONE) 2-0.5 mg SL film 0.5 mg of buprenorphine: .25 | SUBLINGUAL | @ 13:00:00

## 2022-10-12 MED ADMIN — mirtazapine (REMERON) tablet 15 mg: 15 mg | GASTROENTERAL | @ 01:00:00

## 2022-10-12 MED ADMIN — promethazine (PHENERGAN) 6.5 mg in sodium chloride (NS) 0.9 % 50 mL IVPB: 6.5 mg | INTRAVENOUS | @ 06:00:00

## 2022-10-12 MED ADMIN — pantoprazole (Protonix) injection 40 mg: 40 mg | INTRAVENOUS | @ 13:00:00

## 2022-10-12 MED ADMIN — baclofen (LIORESAL) tablet 10 mg: 10 mg | ORAL | @ 13:00:00

## 2022-10-12 MED ADMIN — fat emulsion 20 % with fish oil (SMOFlipid) infusion 250 mL: 250 mL | INTRAVENOUS | @ 03:00:00 | Stop: 2022-10-12

## 2022-10-12 MED ADMIN — famotidine (PF) (PEPCID) injection 20 mg: 20 mg | INTRAVENOUS | @ 01:00:00

## 2022-10-12 MED ADMIN — Parenteral Nutrition (CENTRAL): INTRAVENOUS | @ 03:00:00 | Stop: 2022-10-12

## 2022-10-12 MED ADMIN — LORazepam (ATIVAN) tablet 0.5 mg: .5 mg | ORAL

## 2022-10-12 NOTE — Unmapped (Signed)
Department of Anesthesiology  Pain Medicine Division    Chronic Pain Followup Inpatient Consult Note    Requesting Attending Physician:  Arman Filter, MD  Service Requesting Consult:  Med Bernita Raisin [facility])    Assessment/Recommendations:    The patient was seen in consultation on request of Rocco Serene, MD regarding assistance with pain management for patient's chronic intractable abdominal pain with now acute on chronic recurrent ileus/NG tube placement.  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      Patient currently established with [facility] pain management center Dr. Dyann Ruddle, has been on oxycodone 10 mg 3 times daily, Suboxone 0.5 mg 3 times daily along with baclofen and pregabalin. Overall goal is to wean to buprenorphine and non-opioid medications and eventually off of buprenorphine as well. Through her psychiatrist she is on Ativan 0.5 mg 3 times daily as needed. She has contracted to avoid taking the ativan at the same time or close to when she takes her opioid pain medications.     Patient has had multiple prior inpatient stays either regarding acute exacerbation of chronic pain or ileus.  In the past 12 months she has had 2 infusions of ketamine with limited benefit (last infusion in May 2024) and at one point there was some transaminitis which was thought to be attributed from other etiologies like TPN versus other intra-abdominal pathologies.  In the past patient also has had lidocaine infusions during inpatient stay with limited/variable benefit.        Regarding ileus and her constipation, After discussion with the patient and mother we came to an agreement to attempt with a low-dose of naloxegol 12.5 mg, she has taken enterag around surgery time before and seemed to tolerate it. GI team recommended against neostigmine at this time. We expect the naloxegol to increase her abdominal pain at first but hope that we can continue it and even increase to 25mg  if able.     This is no doubt a very complex situation. Her biggest issue at this point seems to be the ileus. Medication management should focus on non-opioid medications, although she has trialed others in past with side effects or minimal benefit.  Hopefully minimizing opioids and working on her ileus will help get her out of the hospital quicker than previously.  Our goal while admitted would be for her to be able to manage her pain without the need for IV opioids and instead take oxycodone up to 15 mg 4 times daily as needed, with the goal of trying to get as close to oxycodone 10 mg 3 times daily as needed as possible.    Her Suboxone is currently increased from her home dose in an attempt to provide optimal basal control of her pain, favoring an increase in buprenorphine which is a partial mu opioid agonist over use of full mu opioid agonists, which can have worse GI side effect profiles.  Again, ultimately the goal would be to wean off of oxy and manage her pain with buprenorphine alone.     Has had lidocaine gtt this admission with most help. Has been avoiding IV HM with success. Has been having HA's c/w tension type headaches with likely rebound/medication over use component.     Interval: [name] appeared to be the most comfortable that she has been since being admitted, and close to her outpatient baseline.  Yesterday, it  was noticed that her ketamine infusion was infusing at 0.25 mg/h and not 0.25 mg/kg/h of ideal body weight as was ordered.  This appears to have been the case for approximately 24 hours.  As a result, we will not count that day against her total maximum allotment for ketamine.  Tyrisha appears significantly more comfortable now that she is on the appropriate dose of ketamine.  She denies side effects from her ketamine dose.  She endorses significant benefit since the dose increase.  We will continue her ketamine unchanged and continue her Suboxone unchanged.  We will aim to turn ketamine off on Tuesday but can turn it off earlier if she has marked improvement or keep it on longer should she require it.  She noted that she continues to have resolved not to use oxycodone or IV hydromorphone and endorses passing gas.  I reinforced the fact that she has been very resilient so far and that I am very encouraged by the progress she is making.    Recommendations:  -The chronic pain service is a consult service and does not place orders, just makes recommendations (except ketamine and lidocaine infusions)   -Please evaluate all patients on opioids for appropriateness of prescribing narcan at discharge.  The chronic pain service can assist with this.  Nasal narcan is covered by most insurances.  -Recommendations given apply to the current hospitalization and do not reflect long term recommendations.    -Patient is on a low dose ketamine infusion for pain   -Ketamine gtt #3 of 4 maximum over a 12 month period     -All ketamine orders will be managed by the [facility] Chronic Pain Service only-orders can only be placed by attendings and fellows   -Ketamine requires dedicated IV (PIV or central line lumen)   -Continue ketamine infusion: 0.25 mg/kg/hr Ideal body weight   -Patient location: Step down: dose can increase every 4 hours (can decrease anytime)   -Day 2. Started (date): 8/1 (will not count 8/3 against total given near-absence of ketamine given in that time)   -Prior to starting: Recent EKG, CMP (creatinine, LFTs), urine pregnancy test (if applicable)   - Daily CMP (creatinine and LFTs daily) while running   -Please contact the Chronic Pain service with any questions or concerns about ketamine infusion.    - Patient's neurology send out laboratories have resulted, recommend primary team loop and neurology for assessment and recommendations based off of the results of these send out labs  - Discontinued lidocaine infusion at scheduled time.  09/28/22 - 10/01/22  - Continue IV APAP holiday for concern for rebound headache  - Pt will continue to try to avoid PRN Ativan use  - Pt agreed to trial a holiday of IV Benadryl  - If headaches are not improved from IV Tylenol holiday, consider Fioricet once daily as needed for headaches  - Agree with GI recs of naloxegol 12.5 mg p.o. daily.  GI following   - Continue Baclofen to 10 mg twice daily per NG tube  - Agree with primary team pain and bowel regimen   - Recommend continued physical therapy to work on bed level mobility   - Would recommend speech therapy consult to work on oral desensitization as she has not been able to tolerate anyone in her mouth such as ice chips or small sips of water  - Recommend involvement of inpatient psychology and complex care given patient's distress surrounding hospitalization      We will continue to  follow.    Naloxone Rx at discharge?  Is patient on opioids? Yes.  1)Is dose >50MME?  Yes.  2) Is patient prescribed a benzodiazepine (w opioids)? Yes.  3)Hx of overdose?  No.  4) Hx of substance use disorder? No.  5) Opioids likely to last greater than a week after discharge? Yes.     If yes to 2 or more, prescribe naloxone at discharge.  Nasal narcan for most insured (Nasal narcan 4mg /actuation, prescribe 1 kit, instructions at [email or web address]).  For uninsured, chronic pain can work to assist in finding an option.  OTC nasal narcan now available at most pharmacies for around $45.    Interim History  There were no Acute Events Overnight.  The patient is not obtaining adequate pain relief on current medication regimen and feels that their pain is Somewhat controlled    Today patient reports pain and headaches are globally improved, denies ketamine Sfx's.    Analgesia Evaluation:  Pain at minimum: 0/10  Pain at maximum: 8/10    Current pain medication regimen (including how frequent PRN's were used):  Ketamine gtt  Suboxone 2-0.5mg  TID   Tylenol IV Baclofen 5 mg DID     Inpatient Medications  Current Facility-Administered Medications   Medication Dose Route Frequency Provider Last Rate Last Admin    baclofen (LIORESAL) tablet 10 mg  10 mg Oral BID Lambert Mody, MD PhD   10 mg at 10/12/22 1610    bisacodyl (DULCOLAX) suppository 10 mg  10 mg Rectal BID Lambert Mody, MD PhD   10 mg at 10/12/22 9604    buprenorphine-naloxone (SUBOXONE) 2-0.5 mg SL film 0.5 mg of buprenorphine  0.25 Film Sublingual TID Lambert Mody, MD PhD   0.5 mg of buprenorphine at 10/12/22 5409    calcium carbonate (TUMS) chewable tablet 400 mg elem calcium  400 mg elem calcium Oral Daily PRN Terri Piedra, AGNP        [Provider Hold] cefdinir (OMNICEF) capsule 300 mg  300 mg Oral Q12H [facility] Rocco Serene, MD        [Provider Hold] doxycycline (VIBRA-TABS) tablet 100 mg  100 mg Oral BID Rocco Serene, MD        famotidine (PF) (PEPCID) injection 20 mg  20 mg Intravenous At bedtime Rocco Serene, MD   20 mg at 10/11/22 2120    fat emulsion 20 % (INTRAlipid) infusion 100 mL  100 mL Intravenous Once PRN Lobonc, Ammie Ferrier, MD        fat emulsion 20 % (INTRAlipid) infusion 200 mL  200 mL Intravenous Once PRN Lobonc, Ammie Ferrier, MD        Parenteral Nutrition (CENTRAL)   Intravenous Continuous Arman Filter, MD 45 mL/hr at 10/11/22 2304 New Bag at 10/11/22 2304    And    fat emulsion 20 % with fish oil (SMOFlipid) infusion 250 mL  250 mL Intravenous Continuous Arman Filter, MD 20.8 mL/hr at 10/11/22 2302 250 mL at 10/11/22 2302    Parenteral Nutrition (CENTRAL)   Intravenous Continuous Arman Filter, MD        And    fat emulsion 20 % with fish oil (SMOFlipid) infusion 250 mL  250 mL Intravenous Continuous Arman Filter, MD        ketamine 500 mg in sodium chloride 0.9 % 250 mL (2 mg/mL)  0.25 mg/kg/hr (Ideal) Intravenous Continuous Nat Christen., MD 7.13 mL/hr at 10/11/22 2135 0.25 mg/kg/hr at  10/11/22 2135    LORazepam (ATIVAN) tablet 0.5 mg  0.5 mg Oral TID PRN Lambert Mody, MD PhD   0.5 mg at 10/12/22 1610    methylnaltrexone (RELISTOR) injection 8 mg  8 mg Subcutaneous Daily Lambert Mody, MD PhD   8 mg at 10/11/22 1255    mirtazapine (REMERON) tablet 15 mg  15 mg Enteral tube: gastric Nightly Lambert Mody, MD PhD   15 mg at 10/11/22 2120    ondansetron (ZOFRAN) injection 4 mg  4 mg Intravenous Q6H PRN Terri Piedra, AGNP   4 mg at 10/11/22 2301    pantoprazole (Protonix) injection 40 mg  40 mg Intravenous BID Rocco Serene, MD   40 mg at 10/12/22 0905    phenol (CHLORASEPTIC) 1.4 % spray 2 spray  2 spray Mucous Membrane Q2H PRN Rocco Serene, MD   2 spray at 10/03/22 0138    [Provider Hold] polyethylene glycol (MIRALAX) packet 17 g  17 g Oral Daily PRN Terri Piedra, AGNP        promethazine (PHENERGAN) 6.5 mg in sodium chloride (NS) 0.9 % 50 mL IVPB  6.5 mg Intravenous Q6H PRN Suzzanne Cloud, MD 171 mL/hr at 10/12/22 0829 6.5 mg at 10/12/22 0829    sodium chloride (NS) 0.9 % infusion  10 mL/hr Intravenous Continuous Nat Christen., MD        sodium chloride 0.45% (1/2 NS) infusion  100 mL/hr Intravenous Continuous [name], MD 100 mL/hr at 10/11/22 2301 100 mL/hr at 10/11/22 2301    zinc oxide-cod liver oil (DESITIN 40%) Paste   Topical Daily PRN Arman Filter, MD             Objective:     Vital Signs    Temp:  [36.6 ??C (97.8 ??F)-36.8 ??C (98.3 ??F)] 36.8 ??C (98.3 ??F)  Heart Rate:  [72-86] 73  SpO2 Pulse:  [68-91] 71  Resp:  [11-22] 12  BP: (102-121)/(43-72) 121/66  MAP (mmHg):  [62-88] 83  SpO2:  [97 %-100 %] 100 %      Physical Exam     GENERAL:  Well developed, well-nourished female and looking uncomfortable from pain and NG tube discomfort. Sitting up in bed and appearing the most comfortable she has been this hospitalization   HEAD/NECK:    Reveals normocephalic/atraumatic. NGT present, hoarse voice  CARDIOVASCULAR:   WWP, regular tate  LUNGS:   Normal work of breathing, [place]  EXTREMITIES:  Warm, no clubbing, cyanosis, or edema was noted.  NEUROLOGIC:    The patient was alert and oriented times four with normal language, attention, cognition and memory. Cranial nerve exam was grossly normal.    MUSCULOSKELETAL:    SKIN:  No obvious rashes lesions or erythema  PSY:  Appropriate affect and mood.    Test Results    Lab Results   Component Value Date    CREATININE 0.47 (L) 10/12/2022     Lab Results   Component Value Date    ALKPHOS 58 10/12/2022    BILITOT 0.3 10/12/2022    BILIDIR <0.10 10/11/2022    PROT 5.8 10/12/2022    ALBUMIN 3.1 (L) 10/12/2022    ALT 12 10/12/2022    AST 14 10/12/2022           Problem List    Principal Problem:    Other acute postprocedural pain  Active Problems:    [name]    Intestinal polyps  [name]    Neoplasm related pain    Physical deconditioning    History of colectomy    Intractable abdominal pain    Generalized anxiety disorder with panic attacks    SBO (small bowel obstruction) (CMS-HCC)    Small intestinal bacterial overgrowth (SIBO)      Nat Christen, MD  Assistant Professor of Anesthesiology  Department of Anesthesiology   Divisions of General Anesthesiology and Pain Medicine     Medical Decision Making    Problems Addressed:  1 or more chronic illnesses with severe exacerbation, progression, or side effects of treatment (high)    Amount/Complexity:  Reviewed external records: Primary team progress note  Reviewed results: 8/4 CMP   Ordered tests: none  Assessment requiring independent historian: patient's mother  Discussion of management/test results with medical professional: Primary team  Independent interpretation of test: none    Risk  High: Drug therapy requiring intensive monitoring for toxicity, Decision regarding hospitalization, Elective Major Surgery w/ risk factors, Emergency Major Surgery, Decision to DNR or de-escalate care because of poor prognosis, parental controlled substances (IV ketamine and IV opioids)

## 2022-10-12 NOTE — Unmapped (Signed)
Pt is A/Ox4, VSS on 2L [place]. Pain controlled with continuous Ketamine infusion. TPN infusing. See PRNs given for nausea. Adequate UOP, 3BMs today. Advanced to clear liquid diet. NGT in place and clamped. Unclamped today for ~60mins with ~55mL bilious output. Family at bedside and updated. See flowsheets/MAR for more info.     Problem: Adult Inpatient Plan of Care  Goal: Plan of Care Review  Outcome: Progressing  Goal: Patient-Specific Goal (Individualized)  Outcome: Progressing  Goal: Absence of Hospital-Acquired Illness or Injury  Outcome: Progressing  Intervention: Identify and Manage Fall Risk  Recent Flowsheet Documentation  Taken 10/12/2022 0800 by Hassie Bruce, RN  Safety Interventions:   aspiration precautions   bed alarm   bleeding precautions   commode/urinal/bedpan at bedside   environmental modification   family at bedside   infection management   lighting adjusted for tasks/safety   low bed   room near unit station   nonskid shoes/slippers when out of bed  Intervention: Prevent Infection  Recent Flowsheet Documentation  Taken 10/12/2022 0800 by Hassie Bruce, RN  Infection Prevention:   environmental surveillance performed   equipment surfaces disinfected   hand hygiene promoted   personal protective equipment utilized   rest/sleep promoted   single patient room provided  Goal: Optimal Comfort and Wellbeing  Outcome: Progressing  Goal: Readiness for Transition of Care  Outcome: Progressing  Goal: Rounds/Family Conference  Outcome: Progressing     Problem: Latex Allergy  Goal: Absence of Allergy Symptoms  Outcome: Progressing     Problem: Fall Injury Risk  Goal: Absence of Fall and Fall-Related Injury  Outcome: Progressing  Intervention: Promote Injury-Free Environment  Recent Flowsheet Documentation  Taken 10/12/2022 0800 by Hassie Bruce, RN  Safety Interventions:   aspiration precautions   bed alarm   bleeding precautions   commode/urinal/bedpan at bedside   environmental modification   family at bedside   infection management   lighting adjusted for tasks/safety   low bed   room near unit station   nonskid shoes/slippers when out of bed     Problem: Skin Injury Risk Increased  Goal: Skin Health and Integrity  Outcome: Progressing  Intervention: Optimize Skin Protection  Recent Flowsheet Documentation  Taken 10/12/2022 1541 by Hassie Bruce, RN  Head of Bed [facility]) Positioning: HOB at 30-45 degrees  Taken 10/12/2022 1119 by Hassie Bruce, RN  Head of Bed [facility]) Positioning: HOB at 30-45 degrees  Taken 10/12/2022 0800 by Hassie Bruce, RN  Pressure Reduction Techniques:   frequent weight shift encouraged   heels elevated off bed   weight shift assistance provided  Head of Bed [facility]) Positioning: HOB at 30-45 degrees  Taken 10/12/2022 0743 by Hassie Bruce, RN  Head of Bed [facility]) Positioning: HOB at 30-45 degrees     Problem: Self-Care Deficit  Goal: Improved Ability to Complete Activities of Daily Living  Outcome: Progressing

## 2022-10-12 NOTE — Unmapped (Signed)
Daily Progress Note    Assessment/Plan:    Principal Problem:    Other acute postprocedural pain  Active Problems:    [name]    Intestinal polyps    [name]    Neoplasm related pain    Physical deconditioning    History of colectomy    Intractable abdominal pain    Generalized anxiety disorder with panic attacks    SBO (small bowel obstruction) (CMS-HCC)    Small intestinal bacterial overgrowth (SIBO)  Resolved Problems:    * No resolved hospital problems. 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    Ileus, multifactorial but with component of narcotic bowel, improving  Inability to tolerate PO, without diagnosis of malnutrition  Having increasing bowel movements between suppositories, increasing bowel sounds on exam, and less output via NG tube when placed to LIS. Tolerating NG clamp most of the time. Suspect ileus is starting to resolve since weaning off opioids as of 8/3. GI previously following, now signed off. Discussed with Chronic Pain provider [name] today 8/4.   - Appreciate Chronic Pain recommendations  - SLP following  - Advance diet to small amounts of clear liquids as tolerated  - NG tube clamped; avoid LIS unless having severe/intractable nausea/vomiting   - Patient needs to be able to swallow PO meds to have NG tube removed  - continue TPN for nutritional support, reorder daily  - continue Relistor daily (since 7/31) in case of decreased absorption of naloxegol or ongoing use of NG tube limiting effect  - continue bowel regimen, needs extensive regimen including twice daily enema or suppository  - continue ketamine infusion (8/1 - ), may need to extend 1-2 extra days due to unintentional underdosing of ketamine infusion 8/2-8/3  - discontinued oxycodone and hydromorphone as of 8/3; can consider one-time only doses only for severe pain but patient agrees to try to minimize.     Headache, improved  Minimal improvement with IV fluids, magnesium, IV Benadryl. Concerning for rebound/medication overuse headache. Headache has improved with Tylenol holiday and ketamine.   - Continue Tylenol holiday (last dose 7/29)  - Consider adding Fioricet daily prn if no improvement with Tylenol holiday  - Avoid NSAIDs, avoid IV Benadryl    Syncope, uncertain etiology:  Possibly vasovagal vs adverse effect of medications vs deconditioning. Has improved with weaning opioids.   - adjust benzos, other potentially vasoactive meds as tolerated   - broaden eval if recurrent, noting this has been issue previously during hospitalization     Muscle spasms and diffuse pain, uncertain etiology  Occurred post-procedurally, with reassuring evaluation (MRI spine, neuro c/s, CK normal).  - Appreciate Chronic Pain recommendations  - continue baclofen 10 mg BID (increased 7/31)  - continue ativan TID prn (PTA med), but caution against escalating (noting risks of concurrent benzos + opiates, possibility of contribution to syncope and other symptoms)    Coffee-ground emesis, resolved  Noted in NGT 7/21 and 7/31. Most likely due to NG tube trauma and has prior hx of ulcers iso anticoagulation. Hgb stable  9.5-10. GI consulted and recommended conservative management  - monitor CBC q72h  - minimize low-intermittent suction and remove NG once able  - continue IV PPI twice daily    Chronic/stable/resolved problems:  [name]: hold nirogacestat   SIBO: hold home cefdinir, doxy  Desmoid tumors: resume nirogacestat as outpatient, follow with GI, VIR for steroid injections     FEN: Advance to small volumes of clear liquids, TPN.   VTE ppx: ambulation  Code status: Full code  Dispo: home with HH PT/OT/TPN vs SNF pending resolution of ileus and successful removal of NG tube    I personally spent 50 minutes face-to-face and non-face-to-face in the care of this patient, which includes all pre, intra, and post visit time on the date of service.  All documented time was specific to the E/M visit and does not include any procedures that may have been performed.  ___________________________________________________________________    Subjective:  No acute events overnight.  Had muscle shaking and heart rate up to 150s-160s on ambulation yesterday.  Primary RN discovered that her ketamine infusion was running at significantly lower rate then ordered; was running at 0.25 mg/hr instead of 0.25 mg/kg/hr (~14 mg/hr) as ordered.  Infusion rate increased, and patient reports noticeable improvement in her pain this morning.  Still having abdominal pain.  Had nausea yesterday, requiring low intermittent suction briefly this morning, but only had 50 cc output. Still has not attempted swallowing her pills. Had 3 small BMs yesterday and 2 BMs this morning without requiring suppository.  Asking if she can try clear liquids today.  Overall, feels like she is making progress.    Labs/Studies:  Labs per EMR and Reviewed (last 24hrs)    Objective:  BP 132/58  - Pulse 83  - Temp 36.3 ??C (97.3 ??F) (Axillary)  - Resp 18  - Ht 165.1 cm (5' 5)  - Wt 60.2 kg (132 lb 11.5 oz)  - SpO2 99%  - BMI 22.09 kg/m??     GEN: More well-appearing, sitting up in bed, sister at bedside  HEENT: NG tube in place. Voice slightly stronger  CV: RRR, nl S1 S2 no m/r/g  ABD: Soft, mildly distended, interval improvement in bowel sounds, now normoactive and present in all 4 quadrants  EXT: WWP, no edema. Interval improvement in dry skin on left dorsal hand.   NEURO: No focal deficits  PSYCH: appropriate affect

## 2022-10-12 NOTE — Unmapped (Signed)
Patient is A&O x 4. VSS on 2L [place]. Pain well controlled on ketamine gtt. TPN infusing as ordered. Nausea well controlled with PRN zofran x 1 and PRN phenergan x 1. NG tube remains clamped. Adequate urine output. No BM. Mom at bedside. Free from falls and injury. Call bell within reach. See flowsheets/MAR for further info.     Problem: Adult Inpatient Plan of Care  Goal: Readiness for Transition of Care  Outcome: Ongoing - Unchanged     Problem: Self-Care Deficit  Goal: Improved Ability to Complete Activities of Daily Living  Outcome: Ongoing - Unchanged     Problem: Adult Inpatient Plan of Care  Goal: Optimal Comfort and Wellbeing  Outcome: Progressing

## 2022-10-13 LAB — PHOSPHORUS: PHOSPHORUS: 4.6 mg/dL (ref 2.4–5.1)

## 2022-10-13 LAB — COMPREHENSIVE METABOLIC PANEL
ALBUMIN: 3.1 g/dL — ABNORMAL LOW (ref 3.4–5.0)
ALKALINE PHOSPHATASE: 57 U/L (ref 46–116)
ALT (SGPT): 12 U/L (ref 10–49)
ANION GAP: 6 mmol/L (ref 5–14)
AST (SGOT): 14 U/L (ref <=34)
BILIRUBIN TOTAL: 0.3 mg/dL (ref 0.3–1.2)
BLOOD UREA NITROGEN: 13 mg/dL (ref 9–23)
BUN / CREAT RATIO: 29
CALCIUM: 9.2 mg/dL (ref 8.7–10.4)
CHLORIDE: 108 mmol/L — ABNORMAL HIGH (ref 98–107)
CO2: 30 mmol/L (ref 20.0–31.0)
CREATININE: 0.45 mg/dL — ABNORMAL LOW
EGFR CKD-EPI (2021) FEMALE: >90 mL/min/{1.73_m2} (ref >=60)
GLUCOSE RANDOM: 104 mg/dL (ref 70–179)
POTASSIUM: 3.6 mmol/L (ref 3.4–4.8)
PROTEIN TOTAL: 6 g/dL (ref 5.7–8.2)
SODIUM: 144 mmol/L (ref 135–145)

## 2022-10-13 LAB — CBC
HEMATOCRIT: 28.1 % — ABNORMAL LOW (ref 34.0–44.0)
HEMOGLOBIN: 9.4 g/dL — ABNORMAL LOW (ref 11.3–14.9)
MEAN CORPUSCULAR HEMOGLOBIN CONC: 33.3 g/dL (ref 32.0–36.0)
MEAN CORPUSCULAR HEMOGLOBIN: 26.5 pg (ref 25.9–32.4)
MEAN CORPUSCULAR VOLUME: 79.5 fL (ref 77.6–95.7)
MEAN PLATELET VOLUME: 11 fL — ABNORMAL HIGH (ref 6.8–10.7)
PLATELET COUNT: 228 10*9/L (ref 150–450)
RED BLOOD CELL COUNT: 3.54 10*12/L — ABNORMAL LOW (ref 3.95–5.13)
RED CELL DISTRIBUTION WIDTH: 13.6 % (ref 12.2–15.2)
WBC ADJUSTED: 5.3 10*9/L (ref 3.6–11.2)

## 2022-10-13 LAB — MAGNESIUM: MAGNESIUM: 2 mg/dL (ref 1.6–2.6)

## 2022-10-13 MED ADMIN — zinc oxide-cod liver oil (DESITIN 40%) Paste: TOPICAL | @ 14:00:00

## 2022-10-13 MED ADMIN — pantoprazole (Protonix) injection 40 mg: 40 mg | INTRAVENOUS | @ 14:00:00

## 2022-10-13 MED ADMIN — promethazine (PHENERGAN) 6.5 mg in sodium chloride (NS) 0.9 % 50 mL IVPB: 6.5 mg | INTRAVENOUS | @ 02:00:00

## 2022-10-13 MED ADMIN — sodium chloride 0.45% (1/2 NS) infusion: 100 mL/h | INTRAVENOUS | @ 10:00:00

## 2022-10-13 MED ADMIN — bisacodyl (DULCOLAX) suppository 10 mg: 10 mg | RECTAL | @ 14:00:00

## 2022-10-13 MED ADMIN — LORazepam (ATIVAN) tablet 0.5 mg: .5 mg | ORAL | @ 13:00:00

## 2022-10-13 MED ADMIN — fat emulsion 20 % with fish oil (SMOFlipid) infusion 250 mL: 250 mL | INTRAVENOUS | @ 03:00:00 | Stop: 2022-10-13

## 2022-10-13 MED ADMIN — LORazepam (ATIVAN) tablet 0.5 mg: .5 mg | ORAL | @ 22:00:00

## 2022-10-13 MED ADMIN — promethazine (PHENERGAN) 6.5 mg in sodium chloride (NS) 0.9 % 50 mL IVPB: 6.5 mg | INTRAVENOUS | @ 21:00:00

## 2022-10-13 MED ADMIN — buprenorphine-naloxone (SUBOXONE) 2-0.5 mg SL film 0.5 mg of buprenorphine: .25 | SUBLINGUAL | @ 14:00:00

## 2022-10-13 MED ADMIN — ondansetron (ZOFRAN) injection 4 mg: 4 mg | INTRAVENOUS | @ 16:00:00

## 2022-10-13 MED ADMIN — famotidine (PF) (PEPCID) injection 20 mg: 20 mg | INTRAVENOUS | @ 02:00:00

## 2022-10-13 MED ADMIN — baclofen (LIORESAL) tablet 10 mg: 10 mg | ORAL | @ 02:00:00

## 2022-10-13 MED ADMIN — buprenorphine-naloxone (SUBOXONE) 2-0.5 mg SL film 0.5 mg of buprenorphine: .25 | SUBLINGUAL | @ 02:00:00

## 2022-10-13 MED ADMIN — baclofen (LIORESAL) tablet 10 mg: 10 mg | ORAL | @ 14:00:00

## 2022-10-13 MED ADMIN — ondansetron (ZOFRAN) injection 4 mg: 4 mg | INTRAVENOUS | @ 22:00:00

## 2022-10-13 MED ADMIN — pantoprazole (Protonix) injection 40 mg: 40 mg | INTRAVENOUS | @ 02:00:00

## 2022-10-13 MED ADMIN — buprenorphine-naloxone (SUBOXONE) 2-0.5 mg SL film 0.5 mg of buprenorphine: .25 | SUBLINGUAL | @ 19:00:00

## 2022-10-13 MED ADMIN — ondansetron (ZOFRAN) injection 4 mg: 4 mg | INTRAVENOUS | @ 07:00:00

## 2022-10-13 MED ADMIN — Parenteral Nutrition (CENTRAL): INTRAVENOUS | @ 03:00:00 | Stop: 2022-10-13

## 2022-10-13 MED ADMIN — promethazine (PHENERGAN) 6.5 mg in sodium chloride (NS) 0.9 % 50 mL IVPB: 6.5 mg | INTRAVENOUS | @ 13:00:00

## 2022-10-13 MED ADMIN — sodium chloride 0.45% (1/2 NS) infusion: 100 mL/h | INTRAVENOUS | @ 21:00:00

## 2022-10-13 MED ADMIN — ketamine 500 mg in sodium chloride 0.9 % 250 mL (2 mg/mL): .25 mg/kg/h | INTRAVENOUS | @ 11:00:00 | Stop: 2022-10-16

## 2022-10-13 NOTE — Unmapped (Signed)
Hospital Medicine Daily Progress Note    Assessment/Plan:    Principal Problem:    Other acute postprocedural pain  Active Problems:    [name]    Intestinal polyps    [name]    Neoplasm related pain    Physical deconditioning    History of colectomy    Intractable abdominal pain    Generalized anxiety disorder with panic attacks    SBO (small bowel obstruction) (CMS-HCC)    Small intestinal bacterial overgrowth (SIBO)  Resolved Problems:    * No resolved hospital problems. 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    Ileus, multifactorial but with component of narcotic bowel, improving  Inability to tolerate PO, without diagnosis of malnutrition  Having increasing bowel movements between suppositories, increasing bowel sounds on exam, and less output via NG tube when placed to LIS. Tolerating NG clamp most of the time. Suspect ileus is starting to resolve since weaning off opioids as of 8/3. GI previously following, now signed off. Chronic pain service continues to follow.   - Appreciate Chronic Pain recommendations  - SLP following, regular diet recommended. Will continue to reassess. Ideally would be tolerating all pills by mouth and tolerating increased diet before removing NG tube.   - Continue small amounts of clear liquids as tolerated  - NG tube clamped; avoid LIS unless having severe/intractable nausea/vomiting   - Patient needs to be able to swallow PO meds to have NG tube removed  - continue TPN for nutritional support, reorder daily  - stop Relistor (given 7/31-8/4) given now off of full opioid agonists  - continue bowel regimen, needs extensive regimen including twice daily enema or suppository  - continue ketamine infusion (8/1 - ), may need to extend 1-2 extra days due to unintentional underdosing of ketamine infusion 8/2-8/3  - discontinued oxycodone and hydromorphone as of 8/3; can consider one-time only doses only for severe pain but patient agrees to try to minimize.     Headache, improved  Minimal improvement with IV fluids, magnesium, IV Benadryl. Concerning for rebound/medication overuse headache. Headache has improved with Tylenol holiday and ketamine.   - Continue Tylenol holiday (last dose 7/29)  - Consider adding Fioricet daily prn if no improvement with Tylenol holiday  - Avoid NSAIDs, avoid IV Benadryl    Syncope, uncertain etiology:  Possibly vasovagal vs adverse effect of medications vs deconditioning. Has improved with weaning opioids.   - adjust benzos, other potentially vasoactive meds as tolerated   - broaden eval if recurrent, noting this has been issue previously during hospitalization     Muscle spasms and diffuse pain, uncertain etiology  Occurred post-procedurally, with reassuring evaluation (MRI spine, neuro c/s, CK normal).  - Appreciate Chronic Pain recommendations  - continue baclofen 10 mg BID (increased 7/31)  - continue ativan TID prn (PTA med), but caution against escalating (noting risks of concurrent benzos + opiates, possibility of contribution to syncope and other symptoms)    Coffee-ground emesis, resolved  Noted in NGT 7/21 and 7/31. Most likely due to NG  tube trauma and has prior hx of ulcers iso anticoagulation. Hgb stable 9.5-10. GI consulted and recommended conservative management  - monitor CBC q72h  - minimize low-intermittent suction and remove NG once able  - continue IV PPI twice daily    Wet cough  Patient with some coarse breath sounds bilaterally. CXR 8/5 clear.  - Add PRN robitussin  - Encourage incentive spirometry    Chronic/stable/resolved problems:  [name]: hold nirogacestat   SIBO: hold home cefdinir, doxy  Desmoid tumors: resume nirogacestat as outpatient, follow with GI, VIR for steroid injections     FEN: Continue small volumes of clear liquids, TPN.   VTE ppx: ambulation  Code status: Full code  Dispo: home with HH PT/OT/TPN vs SNF pending resolution of ileus and successful removal of NG tube    I personally spent 50 minutes face-to-face and non-face-to-face in the care of this patient, which includes all pre, intra, and post visit time on the date of service.  All documented time was specific to the E/M visit and does not include any procedures that may have been performed.  ___________________________________________________________________    Subjective:  Patient seen this morning and reports that she had a rough night. She feels like nausea was worse overnight.  NG tube put to low intermittent suction around 1030 last night with only 50 mL of output.  Has since been clamped.  Patient also reporting ongoing that is causing her some muscular chest pain from the frequent coughing.  She also did try and swallow impression given via NG tube.  She is generally still feeling quite weak and shaky with walking, notices some persistent tachycardia when she gets up to walk.  She did have more stools over the last 24 hours, documented x 5.  Describes them as yellowy and mucousy.    Labs/Studies:  Labs per EMR and Reviewed (last 24hrs)    Objective:  BP 122/54  - Pulse 78  - Temp 36.8 ??C (98.2 ??F) (Axillary)  - Resp 15  - Ht 165.1 cm (5' 5)  - Wt 60.2 kg (132 lb 11.5 oz)  - SpO2 100%  - BMI 22.09 kg/m??     GEN: More well-appearing, sitting up in bed, mother at bedside  HEENT: NG tube in place.  Voice slightly raspy  CV: RRR, nl S1 S2 no m/r/g  ABD: Soft, mildly distended, bowel sounds present in all 4 quadrants  EXT: WWP, no edema.   NEURO: No focal deficits  PSYCH: appropriate affect

## 2022-10-13 NOTE — Unmapped (Addendum)
Pt is A/ox4, VSS on 2L [place]. Ketamine and TPN infusing. See PRNs for nausea and anxiety. NGT clamped all day today. Poor appetite. Adequate UOP, x2 Bms. Central line dressing changed. Pt ambulated outside of the room this evening. HR up to 150s, nonsustained. Mostly 90-120s during the walk. Pt got light headed/nauseous/in worsening abdominal pain. PRN Zofran and ativan given. Pt back in chair in room. Mom at bedside and updated. See flowsheets/MAR for more info.     Problem: Adult Inpatient Plan of Care  Goal: Plan of Care Review  Outcome: Progressing  Goal: Patient-Specific Goal (Individualized)  Outcome: Progressing  Goal: Absence of Hospital-Acquired Illness or Injury  Outcome: Progressing  Intervention: Identify and Manage Fall Risk  Recent Flowsheet Documentation  Taken 10/13/2022 0800 by Hassie Bruce, RN  Safety Interventions:   aspiration precautions   bed alarm   bleeding precautions   commode/urinal/bedpan at bedside   environmental modification   fall reduction program maintained   infection management   lighting adjusted for tasks/safety   low bed   nonskid shoes/slippers when out of bed   room near unit station  Intervention: Prevent Infection  Recent Flowsheet Documentation  Taken 10/13/2022 0800 by Hassie Bruce, RN  Infection Prevention:   environmental surveillance performed   equipment surfaces disinfected   hand hygiene promoted   personal protective equipment utilized   rest/sleep promoted   single patient room provided  Goal: Optimal Comfort and Wellbeing  Outcome: Progressing  Goal: Readiness for Transition of Care  Outcome: Progressing  Goal: Rounds/Family Conference  Outcome: Progressing     Problem: Latex Allergy  Goal: Absence of Allergy Symptoms  Outcome: Progressing     Problem: Fall Injury Risk  Goal: Absence of Fall and Fall-Related Injury  Outcome: Progressing  Intervention: Promote Injury-Free Environment  Recent Flowsheet Documentation  Taken 10/13/2022 0800 by Hassie Bruce, RN  Safety Interventions:   aspiration precautions   bed alarm   bleeding precautions   commode/urinal/bedpan at bedside   environmental modification   fall reduction program maintained   infection management   lighting adjusted for tasks/safety   low bed   nonskid shoes/slippers when out of bed   room near unit station     Problem: Skin Injury Risk Increased  Goal: Skin Health and Integrity  Outcome: Progressing  Intervention: Optimize Skin Protection  Recent Flowsheet Documentation  Taken 10/13/2022 1131 by Hassie Bruce, RN  Head of Bed [facility]) Positioning: HOB at 30-45 degrees  Taken 10/13/2022 0800 by Hassie Bruce, RN  Pressure Reduction Techniques:   frequent weight shift encouraged   heels elevated off bed   weight shift assistance provided  Head of Bed [facility]) Positioning: HOB at 30-45 degrees  Taken 10/13/2022 0733 by Hassie Bruce, RN  Head of Bed [facility]) Positioning: HOB at 30-45 degrees     Problem: Self-Care Deficit  Goal: Improved Ability to Complete Activities of Daily Living  Outcome: Progressing

## 2022-10-13 NOTE — Unmapped (Signed)
Department of Anesthesiology  Pain Medicine Division    Chronic Pain Followup Inpatient Consult Note    Requesting Attending Physician:  Kateri Plummer, MD  Service Requesting Consult:  Med Bernita Raisin [facility])    Assessment/Recommendations:  The patient was seen in consultation on request of Rocco Serene, MD regarding assistance with pain management for patient's chronic intractable abdominal pain with now acute on chronic recurrent ileus/NG tube placement.  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      Patient currently established with [facility] pain management center Dr. Dyann Ruddle, has been on oxycodone 10 mg 3 times daily, Suboxone 0.5 mg 3 times daily along with baclofen and pregabalin. Overall goal is to wean to buprenorphine and non-opioid medications and eventually off of buprenorphine as well. Through her psychiatrist she is on Ativan 0.5 mg 3 times daily as needed. She has contracted to avoid taking the Ativan at the same time or close to when she takes her opioid pain medications.    Patient has had multiple prior inpatient stays either regarding acute exacerbation of chronic pain or ileus.  In the past 12 months she has had 2 infusions of ketamine with limited benefit (last infusion in May 2024) and at one point there was some transaminitis which was thought to be attributed from other etiologies like TPN versus other intra-abdominal pathologies.  In the past patient also has had lidocaine infusions during inpatient stay with limited/variable benefit.        Regarding ileus and her constipation, After discussion with the patient and mother we came to an agreement to attempt with a low-dose of naloxegol 12.5 mg, she has taken Alvimopan around surgery time before and seemed to tolerate it. GI team recommended against neostigmine at this time. We expect the naloxegol to increase her abdominal pain at first but hope that we can continue it and even increase to 25mg  if able.     This is no doubt a very complex situation. Her biggest issue at this point seems to be the ileus. Medication management should focus on non-opioid medications, although she has trialed others in past with side effects or minimal benefit.  Hopefully minimizing opioids and working on her ileus will help get her out of the hospital quicker than previously.  Our goal while admitted would be for her to be able to manage her pain without the need for IV opioids and instead take oxycodone up to 15 mg 4 times daily as needed, with the goal of trying to get as close to oxycodone 10 mg 3 times daily as needed as possible.    Her Suboxone is currently increased from her home dose in an attempt to provide optimal basal control of her pain, favoring an increase in buprenorphine which is a partial mu opioid agonist over use of full mu opioid agonists, which can have worse GI side effect profiles.  Again, ultimately the goal would be to wean off of oxy and manage her pain with buprenorphine alone.     Has had lidocaine gtt this admission with most help. Has been avoiding IV HM with success. Has been having HA's c/w tension type headaches with likely rebound/medication over use component.     Interval Events: Pain improved. Increasing BMs with increased bowel sounds with less NG output, hospitalist suspects ileus is resolving. Diet advancing. Still has some nausea. CBC and  CMP reviewed, acceptable.    Vital Sign Trends: Vitally stable, afebrile.    Hospital Analgesic Medications:  Baclofen 10 mg PO BID  Suboxone 1-0.5 mg SL TID  Ketamine 0.25 mg/kg/hr    Recommendations:  -The chronic pain service is a consult service and does not place orders, just makes recommendations (except ketamine and lidocaine infusions)   -Please evaluate all patients on opioids for appropriateness of prescribing narcan at discharge.  The chronic pain service can assist with this.  Nasal narcan is covered by most insurances.  -Recommendations given apply to the current hospitalization and do not reflect long term recommendations.    -Patient is on a low dose ketamine infusion for pain   -Ketamine gtt #3 of 4 maximum over a 12 month period     -All ketamine orders will be managed by the [facility] Chronic Pain Service only-orders can only be placed by attendings and fellows   -Ketamine requires dedicated IV (PIV or central line lumen)   -Continue ketamine infusion: 0.25 mg/kg/hr Ideal body weight   -Patient location: Step down: dose can increase every 4 hours (can decrease anytime)   -Day 4. Started (date): 8/1 (will not count 8/3 against total given near-absence of ketamine given in that time)   -Prior to starting: Recent EKG, CMP (creatinine, LFTs), urine pregnancy test (if applicable)   - Daily CMP (creatinine and LFTs daily) while running   -Please contact the Chronic Pain service with any questions or concerns about ketamine infusion.    - Continue IV APAP holiday for concern for rebound headache  - Pt will continue to try to avoid PRN Ativan use  - Pt agreed to trial a holiday of IV Benadryl  - If headaches are not improved from IV Tylenol holiday, consider Fioricet once daily as needed for headaches  - Agree with GI recs of naloxegol 12.5 mg p.o. daily.  GI following.  - Continue Baclofen to 10 mg twice daily per NG tube  - Agree with primary team pain and bowel regimen   - Recommend continued physical therapy to work on bed level mobility   - Recommend involvement of inpatient psychology and complex care given patient's distress surrounding hospitalization    We will continue to follow.    Naloxone Rx at discharge?  Is patient on opioids? Yes.  1)Is dose >50MME?  Yes.  2) Is patient prescribed a benzodiazepine (w opioids)? Yes.  3)Hx of overdose?  No.  4) Hx of substance use disorder? No.  5) Opioids likely to last greater than a week after discharge? Yes. If yes to 2 or more, prescribe naloxone at discharge.  Nasal narcan for most insured (Nasal narcan 4mg /actuation, prescribe 1 kit, instructions at [email or web address]).  For uninsured, chronic pain can work to assist in finding an option.  OTC nasal narcan now available at most pharmacies for around $45.    Interim History  There were no Acute Events Overnight.  The patient is not obtaining adequate pain relief on current medication regimen and feels that their pain is Somewhat controlled    Today patient reports pain and headaches are globally improved, denies ketamine Sfx's.    Analgesia Evaluation:  Pain at minimum: 0/10  Pain at maximum: 5/10    Inpatient Medications  Current Facility-Administered Medications   Medication Dose Route Frequency Provider Last Rate Last Admin    baclofen (LIORESAL) tablet 10 mg  10 mg Oral BID Lambert Mody, MD PhD   10 mg at 10/12/22 2159  bisacodyl (DULCOLAX) suppository 10 mg  10 mg Rectal BID Lambert Mody, MD PhD   10 mg at 10/12/22 2440    buprenorphine-naloxone (SUBOXONE) 2-0.5 mg SL film 0.5 mg of buprenorphine  0.25 Film Sublingual TID Lambert Mody, MD PhD   0.5 mg of buprenorphine at 10/12/22 2159    calcium carbonate (TUMS) chewable tablet 400 mg elem calcium  400 mg elem calcium Oral Daily PRN Terri Piedra, AGNP        [Provider Hold] cefdinir (OMNICEF) capsule 300 mg  300 mg Oral Q12H [facility] Rocco Serene, MD        [Provider Hold] doxycycline (VIBRA-TABS) tablet 100 mg  100 mg Oral BID Rocco Serene, MD        famotidine (PF) (PEPCID) injection 20 mg  20 mg Intravenous At bedtime Rocco Serene, MD   20 mg at 10/12/22 2159    fat emulsion 20 % (INTRAlipid) infusion 100 mL  100 mL Intravenous Once PRN Lobonc, Ammie Ferrier, MD        fat emulsion 20 % (INTRAlipid) infusion 200 mL  200 mL Intravenous Once PRN Lobonc, Ammie Ferrier, MD        Parenteral Nutrition (CENTRAL)   Intravenous Continuous Arman Filter, MD 45 mL/hr at 10/12/22 2253 New Bag at 10/12/22 2253    And    fat emulsion 20 % with fish oil (SMOFlipid) infusion 250 mL  250 mL Intravenous Continuous Arman Filter, MD 20.8 mL/hr at 10/12/22 2234 250 mL at 10/12/22 2234    Parenteral Nutrition (CENTRAL)   Intravenous Continuous Kateri Plummer, MD        And    fat emulsion 20 % with fish oil (SMOFlipid) infusion 250 mL  250 mL Intravenous Continuous Kateri Plummer, MD        ketamine 500 mg in sodium chloride 0.9 % 250 mL (2 mg/mL)  0.25 mg/kg/hr (Ideal) Intravenous Continuous Nat Christen., MD 7.13 mL/hr at 10/13/22 1027 0.25 mg/kg/hr at 10/13/22 2536    LORazepam (ATIVAN) tablet 0.5 mg  0.5 mg Oral TID PRN Lambert Mody, MD PhD   0.5 mg at 10/13/22 6440    methylnaltrexone (RELISTOR) injection 8 mg  8 mg Subcutaneous Daily Lambert Mody, MD PhD   8 mg at 10/12/22 1206    mirtazapine (REMERON) tablet 15 mg  15 mg Enteral tube: gastric Nightly Lambert Mody, MD PhD   15 mg at 10/11/22 2120    ondansetron (ZOFRAN) injection 4 mg  4 mg Intravenous Q6H PRN Terri Piedra, AGNP   4 mg at 10/13/22 3474    pantoprazole (Protonix) injection 40 mg  40 mg Intravenous BID Rocco Serene, MD   40 mg at 10/12/22 2159    phenol (CHLORASEPTIC) 1.4 % spray 2 spray  2 spray Mucous Membrane Q2H PRN Rocco Serene, MD   2 spray at 10/03/22 0138    [Provider Hold] polyethylene glycol (MIRALAX) packet 17 g  17 g Oral Daily PRN Terri Piedra, AGNP        promethazine (PHENERGAN) 6.5 mg in sodium chloride (NS) 0.9 % 50 mL IVPB  6.5 mg Intravenous Q6H PRN Suzzanne Cloud, MD   Stopped at 10/13/22 0857    sodium chloride (NS) 0.9 % infusion  10 mL/hr Intravenous Continuous Nat Christen., MD        sodium chloride 0.45% (1/2 NS) infusion  100 mL/hr Intravenous Continuous  [name], MD 100 mL/hr at 10/13/22 2841 100 mL/hr at 10/13/22 3244    zinc oxide-cod liver oil (DESITIN 40%) Paste Topical Daily PRN Arman Filter, MD         Objective:     Vital Signs    Temp:  [36.3 ??C (97.3 ??F)-36.9 ??C (98.4 ??F)] 36.8 ??C (98.2 ??F)  Heart Rate:  [78-90] 78  SpO2 Pulse:  [78-89] 78  Resp:  [11-18] 11  BP: (99-132)/(48-82) 99/48  MAP (mmHg):  [65-94] 65  SpO2:  [97 %-100 %] 100 %    Physical Exam     GENERAL:  Well developed, well-nourished female in no distress.  HEAD/NECK:    Normocephalic/atraumatic. NGT present.  CARDIOVASCULAR:   WWP, regular tate  LUNGS:   Normal work of breathing, [place]  ABDOMEN  Soft, non-distended.  EXTREMITIES:  Warm and well-perfused.  NEUROLOGIC:    The patient was alert and oriented times four with normal language, attention, cognition and memory. Cranial nerve exam was grossly normal.    MUSCULOSKELETAL:    SKIN:  No obvious rashes lesions or erythema  PSY:  Appropriate affect and mood.    Test Results    Lab Results   Component Value Date    CREATININE 0.45 (L) 10/13/2022     Lab Results   Component Value Date    ALKPHOS 57 10/13/2022    BILITOT 0.3 10/13/2022    BILIDIR <0.10 10/11/2022    PROT 6.0 10/13/2022    ALBUMIN 3.1 (L) 10/13/2022    ALT 12 10/13/2022    AST 14 10/13/2022     Problem List    Principal Problem:    Other acute postprocedural pain  Active Problems:    [name]    Intestinal polyps    [name]    Neoplasm related pain    Physical deconditioning    History of colectomy    Intractable abdominal pain    Generalized anxiety disorder with panic attacks    SBO (small bowel obstruction) (CMS-HCC)    Small intestinal bacterial overgrowth (SIBO)    Medical Decision Making    Problems Addressed:  1 or more chronic illnesses with severe exacerbation, progression, or side effects of treatment (high)    Amount/Complexity:  Reviewed external records: Primary team progress note  Reviewed results: 8/5 CMP   Ordered tests: none  Assessment requiring independent historian: patient's mother  Discussion of management/test results with medical professional: Primary team  Independent interpretation of test: none    Risk  High: Drug therapy requiring intensive monitoring for toxicity, Decision regarding hospitalization, Elective Major Surgery w/ risk factors, Emergency Major Surgery, Decision to DNR or de-escalate care because of poor prognosis, parental controlled substances (IV ketamine and opioids)    Cloyde Reams, DO  Pain Medicine Fellow  Department of Anesthesiology  [facility] [place]

## 2022-10-13 NOTE — Unmapped (Signed)
Pt is A&Ox4, VSS on 2L [place]. Pain control maintained with ketamine infusion. TPN and lipids infusing. PRN phenergan and zofran given for nausea. Adequate UOP. X1 BM this shift. Tolerating sips and chips well. Tolerated small PO pills. NGT in place and clamped. Unclamped today for ~10 mins with ~76mL bilious output. Family at bedside. No falls/injuries this shift, call bell within reach, see flowsheets/MAR for more info.     Problem: Adult Inpatient Plan of Care  Goal: Plan of Care Review  Outcome: Progressing  Goal: Patient-Specific Goal (Individualized)  Outcome: Progressing  Goal: Absence of Hospital-Acquired Illness or Injury  Outcome: Progressing  Intervention: Identify and Manage Fall Risk  Recent Flowsheet Documentation  Taken 10/12/2022 2000 by Coralyn Pear, RN  Safety Interventions:   aspiration precautions   bed alarm   bleeding precautions   commode/urinal/bedpan at bedside   enteral feeding safety   fall reduction program maintained   family at bedside   infection management   lighting adjusted for tasks/safety   low bed  Intervention: Prevent Skin Injury  Recent Flowsheet Documentation  Taken 10/13/2022 0400 by Coralyn Pear, RN  Positioning for Skin: Supine/Back  Taken 10/12/2022 2000 by Coralyn Pear, RN  Positioning for Skin: Bed in Chair  Device Skin Pressure Protection:   absorbent pad utilized/changed   adhesive use limited   tubing/devices free from skin contact   pressure points protected  Skin Protection:   incontinence pads utilized   adhesive use limited   tubing/devices free from skin contact  Intervention: Prevent and Manage VTE (Venous Thromboembolism) Risk  Recent Flowsheet Documentation  Taken 10/12/2022 2000 by Coralyn Pear, RN  VTE Prevention/Management:   ambulation promoted   anticoagulant therapy   bleeding precautions maintained   dorsiflexion/plantar flexion performed   fluids promoted   intravenous hydration  Intervention: Prevent Infection  Recent Flowsheet Documentation  Taken 10/12/2022 2000 by Coralyn Pear, RN  Infection Prevention:   environmental surveillance performed   equipment surfaces disinfected   hand hygiene promoted   personal protective equipment utilized   rest/sleep promoted  Goal: Optimal Comfort and Wellbeing  Outcome: Progressing  Goal: Readiness for Transition of Care  Outcome: Progressing  Goal: Rounds/Family Conference  Outcome: Progressing     Problem: Latex Allergy  Goal: Absence of Allergy Symptoms  Outcome: Progressing     Problem: Fall Injury Risk  Goal: Absence of Fall and Fall-Related Injury  Outcome: Progressing  Intervention: Identify and Manage Contributors  Recent Flowsheet Documentation  Taken 10/12/2022 2000 by Coralyn Pear, RN  Self-Care Promotion:   independence encouraged   BADL personal objects within reach   BADL personal routines maintained  Intervention: Promote Injury-Free Environment  Recent Flowsheet Documentation  Taken 10/12/2022 2000 by Coralyn Pear, RN  Safety Interventions:   aspiration precautions   bed alarm   bleeding precautions   commode/urinal/bedpan at bedside   enteral feeding safety   fall reduction program maintained   family at bedside   infection management   lighting adjusted for tasks/safety   low bed     Problem: Skin Injury Risk Increased  Goal: Skin Health and Integrity  Outcome: Progressing  Intervention: Optimize Skin Protection  Recent Flowsheet Documentation  Taken 10/13/2022 0400 by Coralyn Pear, RN  Activity Management: bedrest  Pressure Reduction Techniques:   frequent weight shift encouraged   pressure points protected  Head of Bed [facility]) Positioning: HOB at 30-45 degrees  Taken 10/13/2022 0200 by Coralyn Pear, RN  Activity Management: bedrest  Head of Bed (  HOB) Positioning: HOB at 30-45 degrees  Taken 10/12/2022 2000 by Coralyn Pear, RN  Activity Management: bedrest  Pressure Reduction Techniques:   frequent weight shift encouraged   pressure points protected  Head of Bed (HOB) Positioning: HOB at 30-45 degrees  Pressure Reduction Devices: pressure-redistributing mattress utilized  Skin Protection:   incontinence pads utilized   adhesive use limited   tubing/devices free from skin contact     Problem: Self-Care Deficit  Goal: Improved Ability to Complete Activities of Daily Living  Outcome: Progressing  Intervention: Promote Activity and Functional Independence  Recent Flowsheet Documentation  Taken 10/12/2022 2000 by Coralyn Pear, RN  Self-Care Promotion:   independence encouraged   BADL personal objects within reach   BADL personal routines maintained

## 2022-10-14 LAB — COMPREHENSIVE METABOLIC PANEL
ALBUMIN: 3.3 g/dL — ABNORMAL LOW (ref 3.4–5.0)
ALKALINE PHOSPHATASE: 55 U/L (ref 46–116)
ALT (SGPT): 13 U/L (ref 10–49)
ANION GAP: 1 mmol/L — ABNORMAL LOW (ref 5–14)
AST (SGOT): 15 U/L (ref <=34)
BILIRUBIN TOTAL: 0.3 mg/dL (ref 0.3–1.2)
BLOOD UREA NITROGEN: 12 mg/dL (ref 9–23)
BUN / CREAT RATIO: 27
CALCIUM: 9 mg/dL (ref 8.7–10.4)
CHLORIDE: 108 mmol/L — ABNORMAL HIGH (ref 98–107)
CO2: 30 mmol/L (ref 20.0–31.0)
CREATININE: 0.44 mg/dL — ABNORMAL LOW
EGFR CKD-EPI (2021) FEMALE: >90 mL/min/{1.73_m2} (ref >=60)
GLUCOSE RANDOM: 97 mg/dL (ref 70–179)
POTASSIUM: 3.5 mmol/L (ref 3.4–4.8)
PROTEIN TOTAL: 5.8 g/dL (ref 5.7–8.2)
SODIUM: 139 mmol/L (ref 135–145)

## 2022-10-14 LAB — PHOSPHORUS: PHOSPHORUS: 4.1 mg/dL (ref 2.4–5.1)

## 2022-10-14 LAB — CORTISOL: CORTISOL TOTAL: 11.4 ug/dL

## 2022-10-14 LAB — FERRITIN: FERRITIN: 5.4 ng/mL — ABNORMAL LOW

## 2022-10-14 LAB — IRON PANEL
IRON SATURATION: 6 % — ABNORMAL LOW (ref 20–55)
IRON: 20 ug/dL — ABNORMAL LOW
TOTAL IRON BINDING CAPACITY: 312 ug/dL (ref 250–425)

## 2022-10-14 LAB — CK: CREATINE KINASE TOTAL: 36 U/L

## 2022-10-14 LAB — MAGNESIUM: MAGNESIUM: 2 mg/dL (ref 1.6–2.6)

## 2022-10-14 MED ADMIN — promethazine (PHENERGAN) 6.5 mg in sodium chloride (NS) 0.9 % 50 mL IVPB: 6.5 mg | INTRAVENOUS | @ 14:00:00

## 2022-10-14 MED ADMIN — baclofen (LIORESAL) tablet 10 mg: 10 mg | ORAL | @ 13:00:00

## 2022-10-14 MED ADMIN — ondansetron (ZOFRAN) injection 4 mg: 4 mg | INTRAVENOUS | @ 18:00:00

## 2022-10-14 MED ADMIN — Parenteral Nutrition (CENTRAL): INTRAVENOUS | @ 03:00:00 | Stop: 2022-10-14

## 2022-10-14 MED ADMIN — sodium chloride 0.45% (1/2 NS) infusion: 100 mL/h | INTRAVENOUS | @ 06:00:00

## 2022-10-14 MED ADMIN — promethazine (PHENERGAN) 6.5 mg in sodium chloride (NS) 0.9 % 50 mL IVPB: 6.5 mg | INTRAVENOUS | @ 03:00:00

## 2022-10-14 MED ADMIN — famotidine (PF) (PEPCID) injection 20 mg: 20 mg | INTRAVENOUS | @ 02:00:00

## 2022-10-14 MED ADMIN — oxyCODONE (ROXICODONE) immediate release tablet 15 mg: 15 mg | ORAL | @ 03:00:00 | Stop: 2022-10-13

## 2022-10-14 MED ADMIN — LORazepam (ATIVAN) tablet 0.5 mg: .5 mg | ORAL | @ 20:00:00

## 2022-10-14 MED ADMIN — buprenorphine-naloxone (SUBOXONE) 2-0.5 mg SL film 0.5 mg of buprenorphine: .25 | SUBLINGUAL | @ 17:00:00

## 2022-10-14 MED ADMIN — promethazine (PHENERGAN) 6.5 mg in sodium chloride (NS) 0.9 % 50 mL IVPB: 6.5 mg | INTRAVENOUS | @ 20:00:00

## 2022-10-14 MED ADMIN — ondansetron (ZOFRAN) injection 4 mg: 4 mg | INTRAVENOUS | @ 11:00:00

## 2022-10-14 MED ADMIN — ketamine 500 mg in sodium chloride 0.9 % 250 mL (2 mg/mL): .25 mg/kg/h | INTRAVENOUS | @ 20:00:00 | Stop: 2022-10-16

## 2022-10-14 MED ADMIN — pantoprazole (Protonix) injection 40 mg: 40 mg | INTRAVENOUS | @ 02:00:00

## 2022-10-14 MED ADMIN — pantoprazole (Protonix) injection 40 mg: 40 mg | INTRAVENOUS | @ 13:00:00

## 2022-10-14 MED ADMIN — buprenorphine-naloxone (SUBOXONE) 2-0.5 mg SL film 0.5 mg of buprenorphine: .25 | SUBLINGUAL | @ 13:00:00

## 2022-10-14 MED ADMIN — acetaminophen (TYLENOL) tablet 650 mg: 650 mg | GASTROENTERAL | @ 18:00:00

## 2022-10-14 MED ADMIN — fat emulsion 20 % with fish oil (SMOFlipid) infusion 250 mL: 250 mL | INTRAVENOUS | @ 03:00:00 | Stop: 2022-10-14

## 2022-10-14 MED ADMIN — baclofen (LIORESAL) tablet 10 mg: 10 mg | ORAL | @ 02:00:00

## 2022-10-14 MED ADMIN — buprenorphine-naloxone (SUBOXONE) 2-0.5 mg SL film 0.5 mg of buprenorphine: .25 | SUBLINGUAL | @ 01:00:00

## 2022-10-14 NOTE — Unmapped (Signed)
Hospital Medicine Daily Progress Note    Assessment/Plan:    Principal Problem:    Other acute postprocedural pain  Active Problems:    [name]    Intestinal polyps    [name]    Neoplasm related pain    Physical deconditioning    History of colectomy    Intractable abdominal pain    Generalized anxiety disorder with panic attacks    SBO (small bowel obstruction) (CMS-HCC)    Small intestinal bacterial overgrowth (SIBO)  Resolved Problems:    * No resolved hospital problems. 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    Ileus, multifactorial but with component of narcotic bowel, improving  Inability to tolerate PO, without diagnosis of malnutrition  Having increasing bowel movements between suppositories, increasing bowel sounds on exam, and less output via NG tube when placed to LIS. Tolerating NG clamp most of the time. Suspect ileus is starting to resolve since weaning off opioids as of 8/3. GI previously following, now signed off. Chronic pain service continues to follow.   - Appreciate Chronic Pain recommendations  - SLP following, regular diet recommended. Will continue to reassess. Ideally would be tolerating all pills by mouth and tolerating increased diet before removing NG tube. Nausea seems to be the bigger problem limiting her oral intake than mechanical swallowing over last 2 days.   - Encouraged larger volume clears today and then consideration of full liquid diet   - NG tube clamped; avoid LIS unless having severe/intractable nausea/vomiting   - Patient needs to be able to swallow PO meds to have NG tube removed  - continue TPN for nutritional support, reorder daily  - stop Relistor (given 7/31-8/4) given now off of full opioid agonists  - continue bowel regimen, needs extensive regimen including twice daily enema or suppository  - continue ketamine infusion (8/1 - ), may need to extend 1-2 extra days due to unintentional underdosing of ketamine infusion 8/2-8/3  - discontinued oxycodone and hydromorphone as of 8/3; can consider one-time only doses only for severe pain but patient agrees to try to minimize.     Syncope, uncertain etiology:  Possibly vasovagal vs adverse effect of medications vs deconditioning vs iron deficiency. Has improved with weaning opioids but still occurring, last incident this morning (LOC per nurse, witnessed).  Previously worked up for PE, with negative CTA.  Last echo in 04/2022 with normal EF and normal RV function.  - adjust benzos, other potentially vasoactive meds as tolerated   - will look into options for treating iron deficiency (see below)   - Cortisol today borderline around 11, plan for cosyntropin stim test tomorrow morning  - Check TSH, free T4, and total T3  - EKG normal sinus rhythm  - ?consider repeat echo    Iron deficiency anemia  Patient with chronic iron deficiency requiring  iron formulation recommended by allergy is not on formulary, will discuss with pharmacy if this can be obtained with special approval or circle back with allergy about a different alternative. However, per A&I note, preparation  would also be for an entire week.    Headache, improved  Minimal improvement with IV fluids, magnesium, IV Benadryl. Concerning for rebound/medication overuse headache. Headache has improved with Tylenol holiday and ketamine though not completely resolved.  - Continue IV Tylenol holiday (last dose 7/29) but will resume enteral PRN tylenol  - Consider adding Fioricet daily prn if no improvement with Tylenol holiday  - Avoid NSAIDs, avoid IV Benadryl    Muscle spasms and diffuse pain, uncertain etiology  Occurred post-procedurally, with reassuring evaluation (MRI spine, neuro c/s, CK normal).  Repeat CK on 8/6 was also normal.  - Appreciate Chronic Pain recommendations  - continue baclofen 10 mg BID (increased 7/31)  - continue ativan TID prn (PTA med), but caution against escalating (noting risks of concurrent benzos + opiates, possibility of contribution to syncope and other symptoms)  - In discussion with oncology, will consider PM&R consult    Coffee-ground emesis, resolved  Noted in NGT 7/21 and 7/31. Most likely due to NG tube trauma and has prior hx of ulcers iso anticoagulation. Hgb stable 9.5-10. GI consulted and recommended conservative management  - monitor CBC q72h  - minimize low-intermittent suction and remove NG once able  - continue IV PPI twice daily    Desmoid tumors - [name]  Spoke with Dr. [name] today, patient's private oncologist.   - Will attempt to restart home nirogacestat at low dose 50 mg      Wet cough  Patient with some coarse breath sounds bilaterally. CXR 8/5 clear.  - Add PRN robitussin  - Encourage incentive spirometry    Chronic/stable/resolved problems:  SIBO: hold home cefdinir, doxy      FEN: Continue small volumes of clear liquids, TPN.   VTE ppx: ambulation  Code status: Full code  Dispo: home with HH PT/OT/TPN vs SNF pending resolution of ileus and successful removal of NG tube    I personally spent 50 minutes face-to-face and non-face-to-face in the care of this patient, which includes all pre, intra, and post visit time on the date of service.  All documented time was specific to the E/M visit and does not include any procedures that may have been performed.  ___________________________________________________________________    Subjective:  Overnight, patient had severe pain and received oxycodone x 1. Reports that 25 ml of output was removed with suction last night, though this wasn't documented. This morning, was still having a good bit of nausea right after receiving meds via NG. Mother described persistent dizziness, nausea, and pain with standing yesterday but no syncope.     Patient later this morning, felt dizzy while going to the bathroom. Nurse and mother were able to get her back to the bed, and she briefly lost consciousness. BP at that time was stable. She regained consciousness and quickly returned to baseline, though with some increased pain.     Later this afternoon, I went by and patient was walking in the hall with walker, nurse, and PT. Wheelchair was wheeled right behind her in event of lightheadedness.     Labs/Studies:  Labs per EMR and Reviewed (last 24hrs)    Objective:  BP 117/77  - Pulse 84  - Temp 36.6 ??C (97.8 ??F) (Oral)  - Resp 23  - Ht 165.1 cm (5' 5)  - Wt 60.2 kg (132 lb 11.5 oz)  - SpO2 99%  - BMI 22.09 kg/m??     GEN: No acute distress  HEENT: NG tube in place.  Voice remains  slightly raspy.   CV: RRR, nl S1 S2 no m/r/g  ABD: Soft, mildly distended, bowel sounds present in all 4 quadrants  EXT: WWP, no edema.   NEURO: No focal deficits  PSYCH: appropriate affect

## 2022-10-14 NOTE — Unmapped (Addendum)
[facility]  Follow-Up Psychiatry Consult Note      Date of admission: 09/25/2022 10:30 AM  Service Date: October 14, 2022  Primary Team: [facility])  LOS:  LOS: 17 days      Assessment:   [name] [name] is a 22 y.o. 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 and reported past psych history of GAD and MDD, admitted 09/25/2022 10:30 AM for other acute postprocedural pain following injection of desmoid tumor with steroid and anesthetic under ultrasound guidance.  Patient was seen in consultation by request of hospitalist on [facility] for evaluation of anxiety and depression.      [name] presents with symptoms consistent with a diagnosis of GAD and MDD, consistent with her historical diagnoses. Patient's symptoms appear to have worsened in the context of discontinuation of mirtazapine and ongoing medical stressors, including loss of independence and limited movement in achieving her longer term goals (e.g., completing nursing school). They are consistent with past reports of above diagnoses and include depressed mood, sleep disturbance, fatigue, anhedonia, hopelessness and passive thoughts of death, and increased anxiety with panic/anxiety attacks.     On today's evaluation, presentation remains consistent with current depressive episode and anxiety is amplified by current medical condition and ongoing hospitalization with ongoing feelings of hopelessness.  She continues to identify that mirtazapine is helpful and is amenable to continuing with plan of care (was not aware that she did not receive it two nights ago and identifies nausea as barrier last night to administration).  Recommend changing mirtazapine time of administration based on patient preference as detailed below.  She recently started a [place] virtual DBT group, but has not been able to attend recent groups due to hospitalization.  I have reached out to [employer] LCSW to inquire about whether there are worksheets/skills that our team can support while she remains hospitalized.  We will continue to follow     Diagnoses:   Active Hospital problems:  Principal Problem:    Other acute postprocedural pain  Active Problems:    [name]    Intestinal polyps    [name]    Neoplasm related pain    Physical deconditioning    History of colectomy    Intractable abdominal pain    Generalized anxiety disorder with panic attacks    SBO (small bowel obstruction) (CMS-HCC)    Small intestinal bacterial overgrowth (SIBO)       Problems edited/added by me:  No problems updated.    Risk Assessment:  ASQ screening result: not completed    -A full risk assessment was previously performed on 7/31.  Risk assessment remains essentially unchanged.    Current suicide risk: low risk  Current homicide risk: low risk      Recommendations:     Safety and Observation Level:   -- This patient is not currently under IVC. If safety concerns arise, please page psychiatry for an evaluation.    Medications:  -- CHANGE mirtazapine 15mg  time of administration to q2200 for mood and anxiety  -- Continue lorazepam 0.5mg  TID PRN severe anxiety    Further Work-up:   -- No further recommendations at this time from a psychiatric standpoint    Behavioral / Environmental:   -- Utilize compassion and acknowledge the patient's experiences while setting clear and realistic expectations for care.     Follow-up:  -- When patient is discharged, please ensure that  their AVS includes information about the 66 Suicide & Crisis Lifeline.  -- The patient currently receives mental health care with [facility] and we will attempt to coordinate followup with our psychiatry team on discharge.  She is also recently established with a virtual DBT group at [facility] ([employer] LCSW)  -- We will follow as needed at this time.     Thank you for this consult request. Recommendations have been communicated to the primary team. Please page [phone]  for any questions or concerns.     Scribe's Attestation: Minus Breeding, MD obtained and performed the history, physical exam and medical decision making elements that were entered into the chart. Signed by Eyvonne Left, Scribe, October 14, 2022 8:16 AM    ----------------------------------------------------------------------------------------------------------------------  October 14, 2022 1:48 PM. Documentation assistance provided by the Scribe. I was present during the time the encounter was recorded. The information recorded by the Scribe was done at my direction and has been reviewed and validated by me.    I personally spent 55 minutes face-to-face and non-face-to-face in the care of this patient, which includes all pre, intra, and post visit time on the date of service.  All documented time was specific to the E/M visit and does not include any procedures that may have been performed.    Minus Breeding, MD  ----------------------------------------------------------------------------------------------------------------------          Subjective   Relevant events since last seen by psychiatry on 8/2:   - Per hospitalist on 8/2: Pt had syncopal episode after ambulating on 8/1. Pt had some difficulty breathing as well as nausea.  - Pt reports worse nausea on 8/4.  - PIV by VAT conducted on 8/2.  - Venous access ultrasound conducted on 8/3.  - PT/OT/SLP tolerated well.   - CXR (8/5) shows new enteric tube in the stomach. No other change.    PRNs:  - Lorazepam 0.5 mg TID PRN severe anxiety: 8/2 @ 0953; 8/3 @ 0753, 1821; 8/5 @ 0906, 1945; 8/5 @ 0837, 1813    Patient Interview:  Pt sitting in bed awake, mother present. Pt reports mood is okay, and that she moved away from narcotics.  Pt discusses her low ketamine dose recently, states that it helped a lot in the past, and so explains why it was not as helpful as she was expecting.  Endorses correcting the dose helped her a lot. Pt states that her mobility is a struggle, and her syncopal episodes remain a setback for her. She endorses light-headedness when she stands up. Also endorses feeling nauseous. Pt asked for pills to be crushed due to nausea. States that this morning was challenging and is frustrated with her body. She feels like everything is going in slow motion, and that she has not been eating for the last 3 weeks. She overall feels like her body is not keeping up with where she wants to be, leading to her feeling discouraged. 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 Discusses her life prior to hospitalization, and feels like her body gives out when she is making an attempt to improve her life, and it leads her to feeling discouraged. Pt starts to become tearful when speaking about topic. Pt feels like she will feel good, and once she scheduled her classes, something goes wrong. She endorses being strong willed, stating that she was able to go through chemo and nursing school as moments when she showed strong will. Also endorses feelings of depression in the  past few months, citing that she stays in her room, and feels like it is hard hearing about her peers' progress while she has been hospitalized. Pt endorses heightened anxiety around a past interaction with a physician where she felt like her safety was not taken into consideration. She does report recent improvement to anxiety due to cancer team helping her advocating for herself. Pt did not receive mirtazapine last night due to nausea, though was not aware she did not receive it the night prior.  She noticed worse sleep as a result of not taking it. Her GI doctor was able to link her mental health to her stomach issues, and pt states that it was her first mental health encounter. Pt feels like she has found her voice recently, and finds that her mental health impacted her physical health to a great degree now. Mirtazapine has helped her for sleep, nausea, appetite, and anxiety. She states that increasing her mirtazapine to 15 mg would lead to her sleeping until 3 PM but that she acclimated to the dose and this went away with time.  Prefers for her mirtazapine to be given later right before sleep as it makes her drowsy immediately.  When asked about if she feels hopeless, pt states that feels like she is trapped. Pt endorses feeling like a lot has been taken away from her, such as friends and personal relationships, and her own body due to weight gain, which impacted her body image. Pt also becomes tearful as she discusses this topic. She does not feel like herself. Denied SI. Pt missed her last appointment for DBT due to being in the hospital.  Pt discusses DBT, and states that she feels like she is unable to participate due to so many visitors and the session being a group (importance of privacy). Pt endorses being worried about not remembering a coloring book activity that she did during her sister's visit, though feels that her memory is good now, citing potential medication effects aroudn that time.  Pt endorses being vulnerable to all or nothing thinking. Pt endorses benefit from continued psychiatry involvement, saying she feels safer.      ROS:   All systems reviewed as negative/unremarkable aside from the following pertinent positives and negatives: anxiety    Collateral:   - Reviewed medical records in Epic  - Spoke to be mother: Pt asks for medications later, but is given earlier in the day. Hydroxyzine and olanzapine were discontinued due to being anti-cholinergic medications with prior concern for urinary retention. Pt staff comes in at 10 AM for her TPN. Mother cancelled pt recent DBT sessions due to pt being hospitalized. Mother feels like a lot of her anxiety is also caused due to her nursing school starting so soon and needing to make decisions around that.    Relevant Updates to past psychiatric, medical/surgical, family, or social history: n/a    Current Medications:  Scheduled Meds:   baclofen  10 mg Oral BID    bisacodyl  10 mg Rectal BID    buprenorphine-naloxone  0.25 Film Sublingual TID    [Provider Hold] cefdinir  300 mg Oral Q12H SCH    [Provider Hold] doxycycline  100 mg Oral BID    famotidine (PEPCID) IV  20 mg Intravenous At bedtime    mirtazapine  15 mg Enteral tube: gastric Nightly    pantoprazole (Protonix) intravenous solution  40 mg Intravenous BID     Continuous Infusions:   Parenteral Nutrition (CENTRAL) 45 mL/hr at  10/13/22 2302    And    fat emulsion 20 % with fish oil 250 mL (10/13/22 2302)    Parenteral Nutrition (CENTRAL)      And    fat emulsion 20 % with fish oil      ketamine 0.25 mg/kg/hr (10/13/22 1610)    sodium chloride      sodium chloride 0.45 % 100 mL/hr (10/14/22 0154)     PRN Meds:.calcium carbonate, fat emulsion 20 %, fat emulsion 20 %, guaiFENesin, LORazepam, ondansetron, phenol, [Provider Hold] polyethylene glycol, promethazine, zinc oxide-cod liver oil      Objective:   Vital signs:   Temp:  [36.4 ??C (97.6 ??F)-37 ??C (98.6 ??F)] 36.4 ??C (97.6 ??F)  Heart Rate:  [77-93] 77  SpO2 Pulse:  [78-90] 80  Resp:  [12-18] 14  BP: (107-139)/(47-76) 107/51  MAP (mmHg):  [61-98] 71  SpO2:  [99 %-100 %] 100 %    Physical Exam:  Gen: No acute distress.    Mental Status Exam:  Appearance:  appears stated age and has NG tube in   Attitude:   calm, cooperative   Behavior/Psychomotor:  appropriate eye contact and no abnormal movements   Speech/Language:   normal rate, not pressured, normal volume, normal fluency. normal articulation   Mood:  ???ok???   Affect:  mood congruent, euthymic, and full range    Thought process:  logical, linear, clear, coherent, goal directed   Thought content:    denies thoughts of self-harm. Denies SI, plans, or intent. Denies HI.  No grandiose, self-referential, persecutory, or paranoid delusions noted.   Perceptual disturbances:   denies auditory and visual hallucinations   Attention:  able to attend to interview without fluctuations in consciousness   Concentration:  Able to fully concentrate and attend   Orientation:  grossly oriented.   Memory:  not formally tested, but grossly intact   Fund of knowledge:   not formally assessed   Insight:    Fair   Judgment:   Fair   Impulse Control:  Fair     Relevant laboratory/imaging data was reviewed.    Additional Psychometric Testing:  Not applicable.    Consult Type and Time-Based Documentation:  This patient was evaluated in person.    Time-based billing disclaimer:

## 2022-10-14 NOTE — Unmapped (Signed)
Pt is A&Ox4, VSS on 2L [place]. Pain control maintained with ketamine infusion. TPN and lipids infusing. Phenergan and zofran given for nausea. One time dose of oxy given for uncontrolled pain. Adeqaute UOP, x2 BM this shift. NGT in place and clamped. Family at bedside. No falls/injuries this shift, call bell within reach, see flowsheets/MAR for more info.   Problem: Adult Inpatient Plan of Care  Goal: Plan of Care Review  Outcome: Ongoing - Unchanged  Goal: Patient-Specific Goal (Individualized)  Outcome: Ongoing - Unchanged  Goal: Absence of Hospital-Acquired Illness or Injury  Outcome: Ongoing - Unchanged  Intervention: Identify and Manage Fall Risk  Recent Flowsheet Documentation  Taken 10/13/2022 2000 by Coralyn Pear, RN  Safety Interventions:   bed alarm   commode/urinal/bedpan at bedside   environmental modification   fall reduction program maintained   family at bedside   infection management   lighting adjusted for tasks/safety   low bed   no IV/BP/blood draw right arm  Intervention: Prevent Skin Injury  Recent Flowsheet Documentation  Taken 10/14/2022 0600 by Coralyn Pear, RN  Positioning for Skin: Supine/Back  Taken 10/14/2022 0400 by Coralyn Pear, RN  Positioning for Skin: Left  Taken 10/14/2022 0200 by Coralyn Pear, RN  Positioning for Skin: Supine/Back  Taken 10/14/2022 0000 by Coralyn Pear, RN  Positioning for Skin: Supine/Back  Taken 10/13/2022 2200 by Coralyn Pear, RN  Positioning for Skin: Sitting in Chair  Taken 10/13/2022 2000 by Coralyn Pear, RN  Positioning for Skin: Sitting in Chair  Device Skin Pressure Protection:   absorbent pad utilized/changed   adhesive use limited   tubing/devices free from skin contact  Skin Protection:   adhesive use limited   incontinence pads utilized   tubing/devices free from skin contact  Intervention: Prevent and Manage VTE (Venous Thromboembolism) Risk  Recent Flowsheet Documentation  Taken 10/13/2022 2000 by Coralyn Pear, RN  VTE Prevention/Management: ambulation promoted   anticoagulant therapy   bleeding precautions maintained   dorsiflexion/plantar flexion performed   fluids promoted   intravenous hydration  Intervention: Prevent Infection  Recent Flowsheet Documentation  Taken 10/13/2022 2000 by Coralyn Pear, RN  Infection Prevention:   environmental surveillance performed   equipment surfaces disinfected   hand hygiene promoted   personal protective equipment utilized   rest/sleep promoted  Goal: Optimal Comfort and Wellbeing  Outcome: Ongoing - Unchanged  Goal: Readiness for Transition of Care  Outcome: Ongoing - Unchanged  Goal: Rounds/Family Conference  Outcome: Ongoing - Unchanged     Problem: Latex Allergy  Goal: Absence of Allergy Symptoms  Outcome: Ongoing - Unchanged     Problem: Fall Injury Risk  Goal: Absence of Fall and Fall-Related Injury  Outcome: Ongoing - Unchanged  Intervention: Promote Injury-Free Environment  Recent Flowsheet Documentation  Taken 10/13/2022 2000 by Coralyn Pear, RN  Safety Interventions:   bed alarm   commode/urinal/bedpan at bedside   environmental modification   fall reduction program maintained   family at bedside   infection management   lighting adjusted for tasks/safety   low bed   no IV/BP/blood draw right arm     Problem: Skin Injury Risk Increased  Goal: Skin Health and Integrity  Outcome: Ongoing - Unchanged  Intervention: Optimize Skin Protection  Recent Flowsheet Documentation  Taken 10/14/2022 0600 by Coralyn Pear, RN  Activity Management: bedrest  Pressure Reduction Techniques:   frequent weight shift encouraged   pressure points protected  Head of Bed (HOB) Positioning: HOB at 30-45 degrees  Taken 10/14/2022 0400 by  Coralyn Pear, RN  Activity Management: bedrest  Pressure Reduction Techniques:   frequent weight shift encouraged   pressure points protected  Head of Bed [facility]) Positioning: HOB at 30-45 degrees  Taken 10/14/2022 0200 by Coralyn Pear, RN  Activity Management: bedrest  Pressure Reduction Techniques:   frequent weight shift encouraged   pressure points protected  Head of Bed [facility]) Positioning: HOB at 30-45 degrees  Taken 10/14/2022 0000 by Coralyn Pear, RN  Activity Management: bedrest  Pressure Reduction Techniques:   frequent weight shift encouraged   pressure points protected  Head of Bed [facility]) Positioning: HOB at 30-45 degrees  Taken 10/13/2022 2200 by Coralyn Pear, RN  Activity Management: up in chair  Pressure Reduction Techniques:   frequent weight shift encouraged   pressure points protected  Head of Bed [facility]) Positioning: HOB at 30-45 degrees  Taken 10/13/2022 2000 by Coralyn Pear, RN  Activity Management: up in chair  Pressure Reduction Techniques:   frequent weight shift encouraged   pressure points protected  Head of Bed (HOB) Positioning: HOB at 30-45 degrees  Pressure Reduction Devices: pressure-redistributing mattress utilized  Skin Protection:   adhesive use limited   incontinence pads utilized   tubing/devices free from skin contact     Problem: Self-Care Deficit  Goal: Improved Ability to Complete Activities of Daily Living  Outcome: Ongoing - Unchanged

## 2022-10-14 NOTE — Unmapped (Signed)
Department of Anesthesiology  Pain Medicine Division    Chronic Pain Followup Inpatient Consult Note    Requesting Attending Physician:  Kateri Plummer, MD  Service Requesting Consult:  Med Bernita Raisin [facility])    Assessment/Recommendations:  The patient was seen in consultation on request of Rocco Serene, MD regarding assistance with pain management for patient's chronic intractable abdominal pain with now acute on chronic recurrent ileus/NG tube placement.  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      Patient currently established with [facility] pain management center Dr. Dyann Ruddle, has been on oxycodone 10 mg 3 times daily, Suboxone 0.5 mg 3 times daily along with baclofen and pregabalin. Overall goal is to wean to buprenorphine and non-opioid medications and eventually off of buprenorphine as well. Through her psychiatrist she is on Ativan 0.5 mg 3 times daily as needed. She has contracted to avoid taking the Ativan at the same time or close to when she takes her opioid pain medications.    Patient has had multiple prior inpatient stays either regarding acute exacerbation of chronic pain or ileus.  In the past 12 months she has had 2 infusions of ketamine with limited benefit (last infusion in May 2024) and at one point there was some transaminitis which was thought to be attributed from other etiologies like TPN versus other intra-abdominal pathologies.  In the past patient also has had lidocaine infusions during inpatient stay with limited/variable benefit.        Regarding ileus and her constipation, After discussion with the patient and mother we came to an agreement to attempt with a low-dose of naloxegol 12.5 mg, she has taken Alvimopan around surgery time before and seemed to tolerate it. GI team recommended against neostigmine at this time. We expect the naloxegol to increase her abdominal pain at first but hope that we can continue it and even increase to 25mg  if able.     This is no doubt a very complex situation. Her biggest issue at this point seems to be the ileus. Medication management should focus on non-opioid medications, although she has trialed others in past with side effects or minimal benefit.  Hopefully minimizing opioids and working on her ileus will help get her out of the hospital quicker than previously.  Our goal while admitted would be for her to be able to manage her pain without the need for IV opioids and instead take oxycodone up to 15 mg 4 times daily as needed, with the goal of trying to get as close to oxycodone 10 mg 3 times daily as needed as possible.    Her Suboxone is currently increased from her home dose in an attempt to provide optimal basal control of her pain, favoring an increase in buprenorphine which is a partial mu opioid agonist over use of full mu opioid agonists, which can have worse GI side effect profiles.  Again, ultimately the goal would be to wean off of oxy and manage her pain with buprenorphine alone.     Has had lidocaine gtt this admission with most help. Has been avoiding IV HM with success. Has been having HA's c/w tension type headaches with likely rebound/medication over use component.     Interval Events: Pain somewhat worsened, requiring PRN oxycodone 15 mg dose. Ambulated with PT, got lightheaded and tachycardic. Nauseated.    Vital Sign Trends: Vitally stable,  afebrile.    Hospital Analgesic Medications:  Acetaminophen 650 mg PO q6h PRN  Baclofen 10 mg PO BID  Suboxone 1-0.5 mg SL TID  Ketamine 0.25 mg/kg/hr    Recommendations:  -The chronic pain service is a consult service and does not place orders, just makes recommendations (except ketamine and lidocaine infusions)   -Please evaluate all patients on opioids for appropriateness of prescribing narcan at discharge.  The chronic pain service can assist with this. Nasal narcan is covered by most insurances.  -Recommendations given apply to the current hospitalization and do not reflect long term recommendations.    -Patient is on a low dose ketamine infusion for pain   -Ketamine gtt #3 of 4 maximum over a 12 month period     -All ketamine orders will be managed by the [facility] Chronic Pain Service only-orders can only be placed by attendings and fellows   -Ketamine requires dedicated IV (PIV or central line lumen)   -Continue ketamine infusion: 0.25 mg/kg/hr Ideal body weight   -Patient location: Step down: dose can increase every 4 hours (can decrease anytime)   -Day 5. Started (date): 8/1 (will not count 8/3 against total given near-absence of ketamine given in that time)   -May discontinue ketamine tomorrow (8/7) and transition to oral medications for symptom management.   -Prior to starting: Recent EKG, CMP (creatinine, LFTs), urine pregnancy test (if applicable)   - Daily CMP (creatinine and LFTs daily) while running   -Please contact the Chronic Pain service with any questions or concerns about ketamine infusion.    - Continue IV APAP holiday for concern for rebound headache  - Pt will continue to try to avoid PRN Ativan use  - Pt agreed to trial a holiday of IV Benadryl  - If headaches are not improved from IV Tylenol holiday, consider Fioricet once daily as needed for headaches  - Agree with GI recs of naloxegol 12.5 mg p.o. daily.  GI following.  - Continue Baclofen to 10 mg twice daily per NG tube  - Agree with primary team pain and bowel regimen   - Recommend continued physical therapy to work on bed level mobility   - Recommend involvement of inpatient psychology and complex care given patient's distress surrounding hospitalization    We will continue to follow.    Naloxone Rx at discharge?  Is patient on opioids? Yes.  1)Is dose >50MME?  Yes.  2) Is patient prescribed a benzodiazepine (w opioids)? Yes.  3)Hx of overdose?  No.  4) Hx of substance use disorder? No.  5) Opioids likely to last greater than a week after discharge? Yes.     If yes to 2 or more, prescribe naloxone at discharge.  Nasal narcan for most insured (Nasal narcan 4mg /actuation, prescribe 1 kit, instructions at [email or web address]).  For uninsured, chronic pain can work to assist in finding an option.  OTC nasal narcan now available at most pharmacies for around $45.    Interim History  There were no Acute Events Overnight.  The patient is not obtaining adequate pain relief on current medication regimen and feels that their pain is Somewhat controlled    Today patient reports pain and headaches are globally improved, denies ketamine Sfx's.    Analgesia Evaluation:  Pain at minimum: 3/10  Pain at maximum: 9/10    Inpatient Medications  Current Facility-Administered Medications   Medication Dose Route Frequency Provider Last Rate Last Admin    acetaminophen (TYLENOL) tablet 650 mg  650 mg  Enteral tube: gastric Q6H PRN Kateri Plummer, MD        baclofen (LIORESAL) tablet 10 mg  10 mg Oral BID Lambert Mody, MD PhD   10 mg at 10/14/22 2130    bisacodyl (DULCOLAX) suppository 10 mg  10 mg Rectal BID Lambert Mody, MD PhD   10 mg at 10/13/22 1004    buprenorphine-naloxone (SUBOXONE) 2-0.5 mg SL film 0.5 mg of buprenorphine  0.25 Film Sublingual TID Lambert Mody, MD PhD   0.5 mg of buprenorphine at 10/14/22 8657    calcium carbonate (TUMS) chewable tablet 400 mg elem calcium  400 mg elem calcium Oral Daily PRN Terri Piedra, AGNP        [Provider Hold] cefdinir (OMNICEF) capsule 300 mg  300 mg Oral Q12H [facility] Rocco Serene, MD        [Provider Hold] doxycycline (VIBRA-TABS) tablet 100 mg  100 mg Oral BID Rocco Serene, MD        famotidine (PF) (PEPCID) injection 20 mg  20 mg Intravenous At bedtime Rocco Serene, MD   20 mg at 10/13/22 2130    fat emulsion 20 % (INTRAlipid) infusion 100 mL  100 mL Intravenous Once PRN Lobonc, Ammie Ferrier, MD        fat emulsion 20 % (INTRAlipid) infusion 200 mL  200 mL Intravenous Once PRN Lobonc, Ammie Ferrier, MD        Parenteral Nutrition (CENTRAL)   Intravenous Continuous Kateri Plummer, MD        And    fat emulsion 20 % with fish oil (SMOFlipid) infusion 250 mL  250 mL Intravenous Continuous Kateri Plummer, MD        guaiFENesin (ROBITUSSIN) oral syrup  200 mg Oral Q4H PRN Kateri Plummer, MD        ketamine 500 mg in sodium chloride 0.9 % 250 mL (2 mg/mL)  0.25 mg/kg/hr (Ideal) Intravenous Continuous Nat Christen., MD 7.13 mL/hr at 10/14/22 0840 0.25 mg/kg/hr at 10/14/22 0840    LORazepam (ATIVAN) tablet 0.5 mg  0.5 mg Oral TID PRN Lambert Mody, MD PhD   0.5 mg at 10/13/22 1813    mirtazapine (REMERON) tablet 15 mg  15 mg Enteral tube: gastric Nightly Lambert Mody, MD PhD   15 mg at 10/11/22 2120    ondansetron (ZOFRAN) injection 4 mg  4 mg Intravenous Q6H PRN Terri Piedra, AGNP   4 mg at 10/14/22 8469    pantoprazole (Protonix) injection 40 mg  40 mg Intravenous BID Rocco Serene, MD   40 mg at 10/14/22 6295    Parenteral Nutrition (CENTRAL)   Intravenous Continuous Kateri Plummer, MD 45 mL/hr at 10/13/22 2302 New Bag at 10/13/22 2302    phenol (CHLORASEPTIC) 1.4 % spray 2 spray  2 spray Mucous Membrane Q2H PRN Rocco Serene, MD   2 spray at 10/03/22 0138    [Provider Hold] polyethylene glycol (MIRALAX) packet 17 g  17 g Oral Daily PRN Terri Piedra, AGNP        promethazine (PHENERGAN) 6.5 mg in sodium chloride (NS) 0.9 % 50 mL IVPB  6.5 mg Intravenous Q6H PRN Suzzanne Cloud, MD 171 mL/hr at 10/14/22 0931 6.5 mg at 10/14/22 0931    sodium chloride (NS) 0.9 % infusion  10 mL/hr Intravenous Continuous Nat Christen., MD        sodium chloride 0.45% (1/2 NS) infusion  100 mL/hr Intravenous Continuous [name], MD 100 mL/hr at 10/14/22 0154 100 mL/hr at 10/14/22 0154    zinc oxide-cod liver oil (DESITIN 40%) Paste   Topical Daily PRN Arman Filter, MD   Given at 10/13/22 1015     Objective:     Vital Signs    Temp:  [36.4 ??C (97.6 ??F)-37 ??C (98.6 ??F)] 36.4 ??C (97.6 ??F)  Heart Rate:  [77-93] 84  SpO2 Pulse:  [78-90] 84  Resp:  [11-23] 23  BP: (107-139)/(50-77) 117/77  MAP (mmHg):  [71-98] 85  FiO2 (%):  [2 %] 2 %  SpO2:  [99 %-100 %] 99 %    Physical Exam     GENERAL:  Well developed, well-nourished female in no distress.  HEAD/NECK:    Normocephalic/atraumatic. NGT present.  CARDIOVASCULAR:   WWP, regular tate  LUNGS:   Normal work of breathing, [place]  ABDOMEN  Soft, non-distended.  EXTREMITIES:  Warm and well-perfused.  NEUROLOGIC:    The patient was alert and oriented times four with normal language, attention, cognition and memory. Cranial nerve exam was grossly normal.    MUSCULOSKELETAL:    SKIN:  No obvious rashes lesions or erythema  PSY:  Appropriate affect and mood.    Test Results    Lab Results   Component Value Date    CREATININE 0.44 (L) 10/14/2022     Lab Results   Component Value Date    ALKPHOS 55 10/14/2022    BILITOT 0.3 10/14/2022    BILIDIR <0.10 10/11/2022    PROT 5.8 10/14/2022    ALBUMIN 3.3 (L) 10/14/2022    ALT 13 10/14/2022    AST 15 10/14/2022     Problem List    Principal Problem:    Other acute postprocedural pain  Active Problems:    [name]    Intestinal polyps    [name]    Neoplasm related pain    Physical deconditioning    History of colectomy    Intractable abdominal pain    Generalized anxiety disorder with panic attacks    SBO (small bowel obstruction) (CMS-HCC)    Small intestinal bacterial overgrowth (SIBO)    Medical Decision Making    Problems Addressed:  1 or more chronic illnesses with severe exacerbation, progression, or side effects of treatment (high)    Amount/Complexity:  Reviewed external records: Primary team progress note  Reviewed results: 8/6 CMP  Ordered tests: none  Discussion of management/test results with medical professional: Primary team  Independent interpretation of test: none    Risk  High: Drug therapy requiring intensive monitoring for toxicity, Decision regarding hospitalization, Elective Major Surgery w/ risk factors, Emergency Major Surgery, Decision to DNR or de-escalate care because of poor prognosis, parental controlled substances (IV ketamine and opioids)    Cloyde Reams, DO  Pain Medicine Fellow  Department of Anesthesiology  [place] [facility] [place]

## 2022-10-15 LAB — T4, FREE: FREE T4: 1.11 ng/dL (ref 0.89–1.76)

## 2022-10-15 LAB — MAGNESIUM: MAGNESIUM: 1.9 mg/dL (ref 1.6–2.6)

## 2022-10-15 LAB — COMPREHENSIVE METABOLIC PANEL
ALBUMIN: 3.2 g/dL — ABNORMAL LOW (ref 3.4–5.0)
ALKALINE PHOSPHATASE: 53 U/L (ref 46–116)
ALT (SGPT): 13 U/L (ref 10–49)
ANION GAP: 5 mmol/L (ref 5–14)
AST (SGOT): 17 U/L (ref <=34)
BILIRUBIN TOTAL: 0.3 mg/dL (ref 0.3–1.2)
BLOOD UREA NITROGEN: 12 mg/dL (ref 9–23)
BUN / CREAT RATIO: 27
CALCIUM: 8.9 mg/dL (ref 8.7–10.4)
CHLORIDE: 107 mmol/L (ref 98–107)
CO2: 28 mmol/L (ref 20.0–31.0)
CREATININE: 0.45 mg/dL — ABNORMAL LOW
EGFR CKD-EPI (2021) FEMALE: >90 mL/min/{1.73_m2} (ref >=60)
GLUCOSE RANDOM: 107 mg/dL (ref 70–179)
POTASSIUM: 3.7 mmol/L (ref 3.4–4.8)
PROTEIN TOTAL: 6 g/dL (ref 5.7–8.2)
SODIUM: 140 mmol/L (ref 135–145)

## 2022-10-15 LAB — CORTISOL
CORTISOL TOTAL: 5.2 ug/dL
CORTISOL TOTAL: 18.6 ug/dL
CORTISOL TOTAL: 21.9 ug/dL

## 2022-10-15 LAB — PHOSPHORUS: PHOSPHORUS: 4.3 mg/dL (ref 2.4–5.1)

## 2022-10-15 LAB — TSH: THYROID STIMULATING HORMONE: 0.688 u[IU]/mL (ref 0.550–4.780)

## 2022-10-15 MED ADMIN — mirtazapine (REMERON) tablet 15 mg: 15 mg | GASTROENTERAL | @ 03:00:00

## 2022-10-15 MED ADMIN — promethazine (PHENERGAN) 6.5 mg in sodium chloride (NS) 0.9 % 50 mL IVPB: 6.5 mg | INTRAVENOUS | @ 03:00:00

## 2022-10-15 MED ADMIN — LORazepam (ATIVAN) tablet 0.5 mg: .5 mg | ORAL | @ 08:00:00

## 2022-10-15 MED ADMIN — baclofen (LIORESAL) tablet 10 mg: 10 mg | ORAL | @ 14:00:00

## 2022-10-15 MED ADMIN — pantoprazole (Protonix) injection 40 mg: 40 mg | INTRAVENOUS | @ 01:00:00

## 2022-10-15 MED ADMIN — ondansetron (ZOFRAN) injection 4 mg: 4 mg | INTRAVENOUS | @ 01:00:00

## 2022-10-15 MED ADMIN — pantoprazole (Protonix) injection 40 mg: 40 mg | INTRAVENOUS | @ 14:00:00

## 2022-10-15 MED ADMIN — cosyntropin (CORTROSYN) injection 0.25 mg: .25 mg | INTRAVENOUS | @ 15:00:00 | Stop: 2022-10-15

## 2022-10-15 MED ADMIN — LORazepam (ATIVAN) tablet 0.5 mg: .5 mg | ORAL | @ 15:00:00

## 2022-10-15 MED ADMIN — promethazine (PHENERGAN) 6.5 mg in sodium chloride (NS) 0.9 % 50 mL IVPB: 6.5 mg | INTRAVENOUS | @ 20:00:00

## 2022-10-15 MED ADMIN — buprenorphine-naloxone (SUBOXONE) 2-0.5 mg SL film 0.5 mg of buprenorphine: .25 | SUBLINGUAL | @ 01:00:00

## 2022-10-15 MED ADMIN — fat emulsion 20 % with fish oil (SMOFlipid) infusion 250 mL: 250 mL | INTRAVENOUS | @ 03:00:00 | Stop: 2022-10-15

## 2022-10-15 MED ADMIN — LORazepam (ATIVAN) tablet 0.5 mg: .5 mg | ORAL | @ 22:00:00

## 2022-10-15 MED ADMIN — cetirizine (ZYRTEC) tablet 20 mg: 20 mg | GASTROENTERAL | @ 14:00:00

## 2022-10-15 MED ADMIN — acetaminophen (TYLENOL) tablet 650 mg: 650 mg | GASTROENTERAL | @ 21:00:00

## 2022-10-15 MED ADMIN — ondansetron (ZOFRAN) injection 4 mg: 4 mg | INTRAVENOUS | @ 16:00:00

## 2022-10-15 MED ADMIN — senna (SENOKOT) tablet 2 tablet: 2 | ORAL | @ 20:00:00

## 2022-10-15 MED ADMIN — sodium chloride 0.45% (1/2 NS) infusion: 100 mL/h | INTRAVENOUS | @ 01:00:00

## 2022-10-15 MED ADMIN — promethazine (PHENERGAN) 6.5 mg in sodium chloride (NS) 0.9 % 50 mL IVPB: 6.5 mg | INTRAVENOUS | @ 12:00:00

## 2022-10-15 MED ADMIN — ketamine 500 mg in sodium chloride 0.9 % 250 mL (2 mg/mL): .25 mg/kg/h | INTRAVENOUS | @ 18:00:00 | Stop: 2022-10-16

## 2022-10-15 MED ADMIN — Parenteral Nutrition (CENTRAL): INTRAVENOUS | @ 03:00:00 | Stop: 2022-10-15

## 2022-10-15 MED ADMIN — buprenorphine-naloxone (SUBOXONE) 2-0.5 mg SL film 0.5 mg of buprenorphine: .25 | SUBLINGUAL | @ 14:00:00

## 2022-10-15 MED ADMIN — bisacodyl (DULCOLAX) suppository 10 mg: 10 mg | RECTAL | @ 01:00:00

## 2022-10-15 MED ADMIN — buprenorphine-naloxone (SUBOXONE) 2-0.5 mg SL film 0.5 mg of buprenorphine: .25 | SUBLINGUAL | @ 20:00:00

## 2022-10-15 MED ADMIN — sodium chloride 0.45% (1/2 NS) infusion: 100 mL/h | INTRAVENOUS | @ 12:00:00

## 2022-10-15 MED ADMIN — sodium chloride 0.45% (1/2 NS) infusion: 100 mL/h | INTRAVENOUS | @ 22:00:00

## 2022-10-15 MED ADMIN — ondansetron (ZOFRAN) injection 4 mg: 4 mg | INTRAVENOUS | @ 22:00:00

## 2022-10-15 MED ADMIN — ondansetron (ZOFRAN) injection 4 mg: 4 mg | INTRAVENOUS | @ 08:00:00

## 2022-10-15 MED ADMIN — baclofen (LIORESAL) tablet 10 mg: 10 mg | ORAL | @ 01:00:00

## 2022-10-15 MED ADMIN — famotidine (PF) (PEPCID) injection 20 mg: 20 mg | INTRAVENOUS | @ 01:00:00

## 2022-10-15 NOTE — Unmapped (Signed)
Pt AOx4, afebrile, VSS this shift. Oxygen >90% on 2L[place], no desat episodes. Adequate UOP, small BMx1 this shift. PRNs for anxiety and nausea given see MAR. Falls, safety, VTE precautions in place. No additional adverse events.    Problem: Adult Inpatient Plan of Care  Goal: Plan of Care Review  Outcome: Ongoing - Unchanged  Flowsheets (Taken 10/15/2022 0647)  Progress: no change  Plan of Care Reviewed With:   patient   parent  Goal: Patient-Specific Goal (Individualized)  Outcome: Ongoing - Unchanged  Goal: Absence of Hospital-Acquired Illness or Injury  Outcome: Ongoing - Unchanged  Intervention: Identify and Manage Fall Risk  Recent Flowsheet Documentation  Taken 10/14/2022 2000 by Nanetta Batty, RN  Safety Interventions:   commode/urinal/bedpan at bedside   fall reduction program maintained   family at bedside   low bed   no IV/BP/blood draw right arm   nonskid shoes/slippers when out of bed  Intervention: Prevent and Manage VTE (Venous Thromboembolism) Risk  Recent Flowsheet Documentation  Taken 10/15/2022 0000 by Nanetta Batty, RN  Anti-Embolism Device Type: SCD, Knee  Anti-Embolism Intervention: On  Anti-Embolism Device Location: BLE  Taken 10/14/2022 2311 by Nanetta Batty, RN  VTE Prevention/Management: anticoagulant therapy  Anti-Embolism Device Type: SCD, Knee  Anti-Embolism Intervention: On  Anti-Embolism Device Location: BLE  Taken 10/14/2022 2200 by Nanetta Batty, RN  Anti-Embolism Device Type: SCD, Knee  Anti-Embolism Intervention: On  Anti-Embolism Device Location: BLE  Taken 10/14/2022 2000 by Nanetta Batty, RN  VTE Prevention/Management: anticoagulant therapy  Anti-Embolism Device Type: SCD, Knee  Anti-Embolism Intervention: On  Anti-Embolism Device Location: BLE  Intervention: Prevent Infection  Recent Flowsheet Documentation  Taken 10/14/2022 2000 by Nanetta Batty, RN  Infection Prevention:   hand hygiene promoted   rest/sleep promoted  Goal: Optimal Comfort and Wellbeing  Outcome: Ongoing - Unchanged  Goal: Readiness for Transition of Care  Outcome: Ongoing - Unchanged  Goal: Rounds/Family Conference  Outcome: Ongoing - Unchanged

## 2022-10-15 NOTE — Unmapped (Signed)
Hospital Medicine Daily Progress Note    Assessment/Plan:    Principal Problem:    Other acute postprocedural pain  Active Problems:    [name]    Intestinal polyps    [name]    Neoplasm related pain    Physical deconditioning    History of colectomy    Intractable abdominal pain    Generalized anxiety disorder with panic attacks    SBO (small bowel obstruction) (CMS-HCC)    Small intestinal bacterial overgrowth (SIBO)  Resolved Problems:    * No resolved hospital problems. 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    Ileus, multifactorial but with component of narcotic bowel, improving  Inability to tolerate PO, without diagnosis of malnutrition  Continues to have small bowel movements with mucous, variable in number over last three days. Need for intermittent suction has decreased and remains overall low volume. Tolerating clears.   - Appreciate Chronic Pain recommendations  - SLP following, regular diet recommended. Continue to encourage pills taken orally.   - Advance to full liquid diet today  - NG tube clamped; avoid LIS unless having severe/intractable nausea/vomiting   - Patient needs to be able to swallow PO meds to have NG tube removed  - continue TPN for nutritional support, reorder daily  - stop Relistor (given 7/31-8/4) given now off of full opioid agonists  - continue bowel regimen with BID suppository and add senna   - continue ketamine infusion (8/1 - ), day 5 today. Stop on day 6 or 7 per chronic pain.   - discontinued oxycodone and hydromorphone as of 8/3; can consider one-time only doses only for severe pain but patient agrees to try to minimize.     Syncope, uncertain etiology:  Possibly vasovagal vs adverse effect of medications vs deconditioning vs iron deficiency. Has improved with weaning opioids but still occurring, last incident 8/6 (LOC per nurse, witnessed).  Previously worked up for PE, with negative CTA.  Last echo in 04/2022 with normal EF and normal RV function.  - adjust benzos, other potentially vasoactive meds as tolerated   - will look into options for treating iron deficiency (see below)   - Cosyntropin stim test today  - Check TSH, free T4  - EKG normal sinus rhythm  - Ensure strictly monitored on telemetry especially with activity    Iron deficiency anemia  Ferritin 5, iron saturation 6%.   - Pharmacy assisting with non-formulary iron formulations  - Pre-med protocol per A&I. Planned for 1 week but will discuss whether this may be shortened in the inpatient setting.   - Antihistamines:   - Cetirizine 20 mg BID today, 8/7  - on the day of infusion, cetirizine 20 qAM, then IV benadryl 50mg  and famotidine 40 mg IV 30 minutes prior to infusion, then cetirizine 20 mg qPM   - Continue cetirizine 20 mg BID for 72 hours after infusion  - IVF hydration 30 minutes prior to infusion  - Steroids: pretreatment with 50mg  prednisone 13 hours prior to the infusion, 50mg  prednisone 7 hours  prior to the infusion, then the solumedrol dose 40mg  (IV dose) prior to the infusion.   - If reaction occurs, tryptase level during the reaction (ideally between 30-90 min of onset of reaction)    Headache, improved  Minimal improvement with IV fluids, magnesium, IV Benadryl. Concerning for rebound/medication overuse headache. Headache has improved with Tylenol holiday and ketamine though not completely resolved.  - Continue IV Tylenol holiday (last dose 7/29) but will resume enteral PRN tylenol  - Consider adding Fioricet daily prn if no improvement with Tylenol holiday  - Avoid NSAIDs, avoid IV Benadryl  - will discuss possible propranolol use based on discussion with PM&R    Muscle spasms and diffuse pain, uncertain etiology  Occurred post-procedurally, with reassuring evaluation (MRI spine, neuro c/s, CK normal).  Repeat CK on 8/6 was also normal.  - Appreciate Chronic Pain recommendations  - continue baclofen 10 mg BID (increased 7/31)  - continue ativan TID prn (PTA med), but caution against escalating (noting risks of concurrent benzos + opiates, possibility of contribution to syncope and other symptoms)  - Consulted PM&R, appreciate assistance. May consider adjusting baclofen dose for hypertonicity.     Coffee-ground emesis, resolved  Noted in NGT 7/21 and 7/31. Most likely due to NG tube trauma and has prior hx of ulcers iso anticoagulation. Hgb stable 9.5-10. GI consulted and recommended conservative management  - monitor CBC q72h  - minimize low-intermittent suction and remove NG once able  - continue IV PPI twice daily    Desmoid tumors - [name]  Spoke with Dr. [name] on 8/6. Will try to restart nirogacestat at low dose.    Wet cough  Patient with some coarse breath sounds bilaterally. CXR 8/5 clear.  - Add PRN robitussin  - Encourage incentive spirometry    Chronic/stable/resolved problems:  SIBO: hold home cefdinir, doxy      FEN: Continue small volumes of clear liquids, TPN.   VTE ppx: ambulation  Code status: Full code  Dispo: home with HH PT/OT/TPN vs SNF pending resolution of ileus and successful removal of NG tube    I personally spent 50 minutes face-to-face and non-face-to-face in the care of this patient, which includes all pre, intra, and post visit time on the date of service.  All documented time was specific to the E/M visit and does not include any procedures that may have been performed.  ___________________________________________________________________    Subjective:  [name] [name] reports having a rough night because she didn't get to sleep much. She is still nauseous this morning but feels a lot better after anti-emetics. Had 1 BM documented overnight but tolerated a variety of clear liquids yesterday and feels up for trying full liquids. She also has some generalized leg pain and feels like she can't get comfortable.     Discussed the plan for the day for cosyntropin stim test and is in agreement with exhausting all medical work up for her syncope.     Labs/Studies:  Labs per EMR and Reviewed (last 24hrs)    Objective:  BP 105/73  - Pulse 85  - Temp 36.6 ??C (97.9 ??F) (Oral)  - Resp 16  - Ht 165.1 cm (5' 5)  - Wt 60.2 kg (132 lb 11.5 oz)  - SpO2 98%  - BMI 22.09 kg/m??     GEN: No acute distress  HEENT: NG tube in place.  Voice stronger.   CV: RRR, nl S1 S2 no m/r/g  ABD: Soft, mildly distended but improved from  prior, bowel sounds present in all 4 quadrants  EXT: WWP, no edema.   NEURO: No focal deficits, lower extremities with slightly increased tone  PSYCH: appropriate affect

## 2022-10-15 NOTE — Unmapped (Signed)
Department of Anesthesiology  Pain Medicine Division    Chronic Pain Followup Inpatient Consult Note    Requesting Attending Physician:  Kateri Plummer, MD  Service Requesting Consult:  Med Bernita Raisin [facility])    Assessment/Recommendations:  The patient was seen in consultation on request of Rocco Serene, MD regarding assistance with pain management for patient's chronic intractable abdominal pain with now acute on chronic recurrent ileus/NG tube placement.  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      Patient currently established with [facility] pain management center Dr. Dyann Ruddle, has been on oxycodone 10 mg 3 times daily, Suboxone 0.5 mg 3 times daily along with baclofen and pregabalin. Overall goal is to wean to buprenorphine and non-opioid medications and eventually off of buprenorphine as well. Through her psychiatrist she is on Ativan 0.5 mg 3 times daily as needed. She has contracted to avoid taking the Ativan at the same time or close to when she takes her opioid pain medications.    Patient has had multiple prior inpatient stays either regarding acute exacerbation of chronic pain or ileus.  In the past 12 months she has had 2 infusions of ketamine with limited benefit (last infusion in May 2024) and at one point there was some transaminitis which was thought to be attributed from other etiologies like TPN versus other intra-abdominal pathologies.  In the past patient also has had lidocaine infusions during inpatient stay with limited/variable benefit.        Regarding ileus and her constipation, After discussion with the patient and mother we came to an agreement to attempt with a low-dose of naloxegol 12.5 mg, she has taken Alvimopan around surgery time before and seemed to tolerate it. GI team recommended against neostigmine at this time. We expect the naloxegol to increase her abdominal pain at first but hope that we can continue it and even increase to 25mg  if able.     This is no doubt a very complex situation. Her biggest issue at this point seems to be the ileus. Medication management should focus on non-opioid medications, although she has trialed others in past with side effects or minimal benefit.  Hopefully minimizing opioids and working on her ileus will help get her out of the hospital quicker than previously.  Our goal while admitted would be for her to be able to manage her pain without the need for IV opioids and instead take oxycodone up to 15 mg 4 times daily as needed, with the goal of trying to get as close to oxycodone 10 mg 3 times daily as needed as possible.    Her Suboxone is currently increased from her home dose in an attempt to provide optimal basal control of her pain, favoring an increase in buprenorphine which is a partial mu opioid agonist over use of full mu opioid agonists, which can have worse GI side effect profiles.  Again, ultimately the goal would be to wean off of oxy and manage her pain with buprenorphine alone.     Has had lidocaine gtt this admission with most help. Has been avoiding IV HM with success. Has been having HA's c/w tension type headaches with likely rebound/medication over use component.     Interval Events: Patient evaluated at bedside this morning. Reports yesterday was a tough 24 hrs. Continues to have abdominal pain. Continues to have episodes of shaking, muscle  rigidity, and worsened abdominal pain with walking. Headaches have overall been improved with ketamine drip. She does not feel she is ready to come of drip at this time.     Vital Sign Trends: Vitally stable, afebrile.    Hospital Analgesic Medications:  Baclofen 10 mg PO BID  Suboxone 1-0.5 mg SL TID  Ketamine 0.25 mg/kg/hr    Recommendations:  -The chronic pain service is a consult service and does not place orders, just makes recommendations (except ketamine and lidocaine infusions)   -Please evaluate all patients on opioids for appropriateness of prescribing narcan at discharge.  The chronic pain service can assist with this.  Nasal narcan is covered by most insurances.  -Recommendations given apply to the current hospitalization and do not reflect long term recommendations.    -Patient is on a low dose ketamine infusion for pain   -Ketamine gtt #3 of 4 maximum over a 12 month period     -All ketamine orders will be managed by the [facility] Chronic Pain Service only-orders can only be placed by attendings and fellows   -Ketamine requires dedicated IV (PIV or central line lumen)   -Continue ketamine infusion: 0.25 mg/kg/hr Ideal body weight   -Patient location: Step down: dose can increase every 4 hours (can decrease anytime)   -Day 5. Started (date): 8/1 (will not count 8/3 against total given near-absence of ketamine given in that time)   -Discussed that 7 days is the hard stop for ketamine infusion, may consider stopping tomorrow, day 6, if she is feeling better   -Prior to starting: Recent EKG, CMP (creatinine, LFTs), urine pregnancy test (if applicable)   - Daily CMP (creatinine and LFTs daily) while running   -Please contact the Chronic Pain service with any questions or concerns about ketamine infusion.    - Continue IV APAP holiday for concern for rebound headache  - Pt will continue to try to avoid PRN Ativan use  - Pt agreed to trial a holiday of IV Benadryl  - If headaches are not improved from IV Tylenol holiday, consider Fioricet once daily as needed for headaches  - Agree with GI recs of naloxegol 12.5 mg p.o. daily.  GI following.  - Continue Baclofen to 10 mg twice daily per NG tube  - Agree with primary team pain and bowel regimen   - Recommend continued physical therapy to work on bed level mobility   - Recommend involvement of inpatient psychology and complex care given patient's distress surrounding hospitalization  -Longterm plan with [name] was to transition back to butrans patch once oral opioids were weaned off, may consider starting this while inpatient once ketamine is discontinued     We will continue to follow.    Naloxone Rx at discharge?  Is patient on opioids? Yes.  1)Is dose >50MME?  Yes.  2) Is patient prescribed a benzodiazepine (w opioids)? Yes.  3)Hx of overdose?  No.  4) Hx of substance use disorder? No.  5) Opioids likely to last greater than a week after discharge? Yes.     If yes to 2 or more, prescribe naloxone at discharge.  Nasal narcan for most insured (Nasal narcan 4mg /actuation, prescribe 1 kit, instructions at [email or web address]).  For uninsured, chronic pain can work to assist in finding an option.  OTC nasal narcan now available at most pharmacies for around $45.    Interim History  There were no Acute Events Overnight.  The patient is not obtaining adequate pain relief on current  medication regimen and feels that their pain is Somewhat controlled    Today patient reports pain and headaches are globally improved, denies ketamine Sfx's.    Analgesia Evaluation:  Pain at minimum: 3/10  Pain at maximum: 9/10    Inpatient Medications  Current Facility-Administered Medications   Medication Dose Route Frequency Provider Last Rate Last Admin    acetaminophen (TYLENOL) tablet 650 mg  650 mg Enteral tube: gastric Q6H PRN Kateri Plummer, MD   650 mg at 10/14/22 1345    baclofen (LIORESAL) tablet 10 mg  10 mg Oral BID Lambert Mody, MD PhD   10 mg at 10/14/22 2055    bisacodyl (DULCOLAX) suppository 10 mg  10 mg Rectal BID Lambert Mody, MD PhD   10 mg at 10/14/22 2055    buprenorphine-naloxone (SUBOXONE) 2-0.5 mg SL film 0.5 mg of buprenorphine  0.25 Film Sublingual TID Lambert Mody, MD PhD   0.5 mg of buprenorphine at 10/14/22 2054    calcium carbonate (TUMS) chewable tablet 400 mg elem calcium  400 mg elem calcium Oral Daily PRN Terri Piedra, AGNP        [Provider Hold] cefdinir (OMNICEF) capsule 300 mg  300 mg Oral Q12H [facility] Rocco Serene, MD        cetirizine (ZYRTEC) tablet 20 mg  20 mg Enteral tube: gastric BID Kateri Plummer, MD        [Provider Hold] doxycycline (VIBRA-TABS) tablet 100 mg  100 mg Oral BID Rocco Serene, MD        famotidine (PF) (PEPCID) injection 20 mg  20 mg Intravenous At bedtime Rocco Serene, MD   20 mg at 10/14/22 2054    fat emulsion 20 % (INTRAlipid) infusion 100 mL  100 mL Intravenous Once PRN Lobonc, Ammie Ferrier, MD        fat emulsion 20 % (INTRAlipid) infusion 200 mL  200 mL Intravenous Once PRN Lobonc, Ammie Ferrier, MD        Parenteral Nutrition (CENTRAL)   Intravenous Continuous Kateri Plummer, MD 45 mL/hr at 10/14/22 2303 New Bag at 10/14/22 2303    And    fat emulsion 20 % with fish oil (SMOFlipid) infusion 250 mL  250 mL Intravenous Continuous Kateri Plummer, MD 20.8 mL/hr at 10/14/22 2303 250 mL at 10/14/22 2303    Parenteral Nutrition (CENTRAL)   Intravenous Continuous Kateri Plummer, MD        And    fat emulsion 20 % with fish oil (SMOFlipid) infusion 250 mL  250 mL Intravenous Continuous Kateri Plummer, MD        guaiFENesin (ROBITUSSIN) oral syrup  200 mg Oral Q4H PRN Kateri Plummer, MD        ketamine 500 mg in sodium chloride 0.9 % 250 mL (2 mg/mL)  0.25 mg/kg/hr (Ideal) Intravenous Continuous Nat Christen., MD 7.13 mL/hr at 10/15/22 0400 0.25 mg/kg/hr at 10/15/22 0400    LORazepam (ATIVAN) tablet 0.5 mg  0.5 mg Oral TID PRN Lambert Mody, MD PhD   0.5 mg at 10/15/22 0353    mirtazapine (REMERON) tablet 15 mg  15 mg Enteral tube: gastric Nightly Kateri Plummer, MD        ondansetron [facility]) injection 4 mg  4 mg Intravenous Q6H PRN Terri Piedra, AGNP   4 mg at 10/15/22 0354    pantoprazole (Protonix) injection 40 mg  40 mg Intravenous BID Eulah Citizen [place],  MD   40 mg at 10/14/22 2054    phenol (CHLORASEPTIC) 1.4 % spray 2 spray  2 spray Mucous Membrane Q2H PRN Rocco Serene, MD   2 spray at 10/03/22 0138    [Provider Hold] polyethylene glycol (MIRALAX) packet 17 g  17 g Oral Daily PRN Terri Piedra, AGNP        promethazine (PHENERGAN) 6.5 mg in sodium chloride (NS) 0.9 % 50 mL IVPB  6.5 mg Intravenous Q6H PRN Suzzanne Cloud, MD 171 mL/hr at 10/15/22 0824 6.5 mg at 10/15/22 0824    sodium chloride (NS) 0.9 % infusion  10 mL/hr Intravenous Continuous Nat Christen., MD        sodium chloride 0.45% (1/2 NS) infusion  100 mL/hr Intravenous Continuous [name], MD 100 mL/hr at 10/15/22 0750 100 mL/hr at 10/15/22 0750    zinc oxide-cod liver oil (DESITIN 40%) Paste   Topical Daily PRN Arman Filter, MD   Given at 10/13/22 1015     Objective:     Vital Signs    Temp:  [36.6 ??C (97.8 ??F)-37.1 ??C (98.8 ??F)] 36.6 ??C (97.9 ??F)  Heart Rate:  [77-94] 81  SpO2 Pulse:  [76-95] 81  Resp:  [9-23] 10  BP: (105-130)/(50-77) 105/51  MAP (mmHg):  [67-87] 67  FiO2 (%):  [2 %] 2 %  SpO2:  [98 %-100 %] 99 %    Physical Exam     GENERAL:  Well developed, well-nourished female in no distress.  HEAD/NECK:    Normocephalic/atraumatic. NGT present.  CARDIOVASCULAR:   WWP, regular tate  LUNGS:   Normal work of breathing, [place]  ABDOMEN  Soft, non-distended.  EXTREMITIES:  Warm and well-perfused.  NEUROLOGIC:    The patient was alert and oriented times four with normal language, attention, cognition and memory. Cranial nerve exam was grossly normal.    MUSCULOSKELETAL:    SKIN:  No obvious rashes lesions or erythema  PSY:  Appropriate affect and mood.    Test Results    Lab Results   Component Value Date    CREATININE 0.45 (L) 10/15/2022     Lab Results   Component Value Date    ALKPHOS 53 10/15/2022    BILITOT 0.3 10/15/2022    BILIDIR <0.10 10/11/2022    PROT 6.0 10/15/2022    ALBUMIN 3.2 (L) 10/15/2022    ALT 13 10/15/2022    AST 17 10/15/2022     Problem List    Principal Problem:    Other acute postprocedural pain  Active Problems:    [name]    Intestinal polyps    [name]    Neoplasm related pain    Physical deconditioning    History of colectomy    Intractable abdominal pain    Generalized anxiety disorder with panic attacks    SBO (small bowel obstruction) (CMS-HCC)    Small intestinal bacterial overgrowth (SIBO)    Medical Decision Making    Problems Addressed:  1 or more chronic illnesses with severe exacerbation, progression, or side effects of treatment (high)    Amount/Complexity:  Reviewed external records: Primary team progress note  Reviewed results: 8/6 CMP  Ordered tests: none  Discussion of management/test results with medical professional: Primary team  Independent interpretation of test: none    Risk  High: Drug therapy requiring intensive monitoring for toxicity, Decision regarding hospitalization, Elective Major Surgery w/ risk factors, Emergency Major Surgery, Decision to DNR or de-escalate care because  of poor prognosis, parental controlled substances (IV ketamine and opioids)    Cloyde Reams, DO  Pain Medicine Fellow  Department of Anesthesiology  [facility] [place]

## 2022-10-15 NOTE — Unmapped (Signed)
Pt is A&Ox4, VSS, but pt ST when walking, O2 sats >90% on 2L [place]. Afebrile. Pain controled well with ketamine. I did give PRN Tylenol once. UO adequate, no BM this shift. TPN running and pt ate a popsicle this shift.. No falls/injuries this shift. Pt did loose consciousness this morning after getting back in bed from the bathroom. Pt felt light headed while on the toilet but we made it back to the bed safely before she lost consciousness.  Ketamine gtt running for pain control. Spoke with the pharmacist today and discovered that the Ketamine needs changed every 24hrs from when the bag was made. I discussed this with the patient and her mother. Patients mother at bedside all shift. Pt walked with myself and PT this shift. She did feel pretty bad after and needed her PRN ativan to help with her anxiety. All monitors with appropriate alarm settings, call bell within reach, see flowsheets/MAR for further info.

## 2022-10-15 NOTE — Unmapped (Signed)
Physical Medicine and Rehab  Inpatient Consult Note    Requesting Attending Physician: Kateri Plummer, MD  Service Requesting Consult: Med Bernita Raisin [facility])    Thank you for this consult.  Please contact the PM&R consult pager [phone]) for questions regarding these recommendations.  For questions of bed availability at [facility], contact the Intake Office at [phone]. Please contact your case manager for updates on AIR process as they are in regular contact with the rehab intake office and should have the latest information.    *This PM&R consult does not guarantee that patient has been accepted to [facility] AIR, 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 Patient is currently hospitalized since 09/25/2022. The patient is seen in consultation for evaluation of rehabilitation needs.    Checklist for Potential AIR:   [ ]  EDD/what is estimated discharge date?  [ ]  Referral from CM if wants [place] AIR  [ ]  PT/OT within 48 hours of AIR  [ ]  CBC/BMP & other pertinent labs within 48 hours of AIR  [ ]  Prefer oral pain meds only, but if on IV pain meds, then no more than q 6-8 hours prn  [ ]  dispo verified and [place] AIR preference  [ ]  Currently necessitating TPN.  Needs finalized TPN and NGT plan. Will need a TPN transition note with prior to discharge.  [ ]  Would not be able to continue ketamine gtt at [facility]  [ ]  Documentation of therapy tolerance when off ketamine gtt     Dispo: home with family. She previously was independent with basic ADLs. Has MSK limitations which required some assistance. She is in [employer]    Principal Medical Problem:  Other acute postprocedural pain    Therapy Summary:   -W/ PT CGA w/RW, STS needing mod A 2x w/o device, min A w/ RW, limited by rigid feeling to hip flexor and knee, therapy limited by fatigue. W/OT patient able to complete hygiene with min A, and lower body hygiene with SBA initially then min a to complete tasks.    Increased Tonicity  Dystonia   Started post-procedurally per patient. Occurs intermittently throughout the day with a heightened occurrence during mobilization. At rest patient bilateral ankles in plantarflexion although no heel cord tightening. Has tonicity most prominent to HF and KE that is not velocity dependent. She takes home baclofen but not for abdominal cramping but not these current subtype of spasms . Home dose is 5 mg TID prn. Currently on baclofen 10 mg BID. Differential wise, Neurology previously evaluated patient seemingly ruling out an acute dystonic reaction, cord compression, a paraneoplastic etiology. On Mri, Spinal cord infarcts can demonstrate some T2 hyperintensity with cord swelling although this evolution could be delayed. Will plan to call radiology about this question as patient had her MRI 1 day after initial symptom presentation.   -Recommend increasing baclofen to 15 mg TID  -At risk for contractures, recommend bilateral soft PRAFO boots  -Will call radiology to see if they are able to rule out spinal cord infarct. However, on review there is not any prominent T2 hyperintensity or cord edema I am able  to appreciate on image review   -We will continue to follow   -Has PRN ativan she was taking at home. This may provide some relief with her dystonia but we would prefer trialilng other medications prior to relying on benzodiazepines for their antispasmodic benefits     Headache  Currently managed with tylenol. She does have some posterior neck muscle tightness. Notes ketamine is helpful for headache pains. Describes photophobia and phonophobia with events. I discussed with patient our hope is to decrease risk of rebound headaches and limit medical issues which will interfere with therapy. An additional thought is patient's headaches could be cervicogenic. She does have loss of cervical lordosis on most recent MRI.   -Given patient with elevated heart rate, frequent headaches, and has limited therapy participation 2/2 tachycardia in setting of continuous headaches, would recommend propranolol 5 mg BID for headache prophylaxis. It is reasonable to increase to 10 mg BID if patient tolerates 5 mg with therapy.  -Prn tylenol +/- prn fiorcet   -Consider initiating management of cervicogenic headaches if without improvement     Multifactorial Ileus   [place] Emesis 2/2 NGT placement (resolved)  Contributors with history of narcotics, poor PO intake. Concern for malnutrition. Improvements to abdominal pain since weaning opioids. Now on TPN.  -TPN (duration)  -s/p opioid wean thus d/c relistor   -bowel regimen: suppository BID  -NG tube in place, NG tube clamped  -Trialing food/meds PO prior to NG tube removal   -IV PPI BID   -pain management following, patient On ketamine    Syncope  Multifactorial. Primary team ddx includes: vasovagal vs adverse effect of medications vs deconditioning vs iron deficiency. Last echo 04/2022  -work-up ongoing with plans for cosyntropin stim test and +/- repeat echo    IDA  -has allergy to formula iron     [name] - Desmoid Tumors  Dr. [name] is patient's outpatient oncologist.   -Plan to restart nirogacestat at 50 mg dose if able per primary team     Other Medical Considerations:  While in AIR, the patient has medical issues requiring active management/supervision by a rehabilitation physician, with face-to-face visits at least 3 days per week to assess the patient both medically and functionally and to modify the course of treatment as needed; The Physiatrist leads an intensive and coordinated interdisciplinary team approach to the delivery of inpatient rehabilitative care that cannot be provided in a less intensive setting such as the home, outpatient center or SNF. If not done in hospital, patient is at high risk for falls, respiratory failure, bleeding, infections, or delirium.  Risk for VTE    Patient's Functional Goals  Increase mobility, return to RN school    Functional Status Updates:  Prior Functional Status:    Prior Functional Status: Pt reports following her last hospitalization she was able to go to the beach briefly and ambulate on the sand. Was also able to play mini-golf but unable to bend down to grab a ball. Since her egg retrieval and the initiation of her new chemo medication, she has been spending most of her time in bed. No falls in the past month, but did have a few falls last hospitalization due to syncope.    Current Functional Status: Therapy notes reviewed and analyzed   Activities of Daily Living:   Assessment  Problem List: Decreased strength, Impaired ADLs, Decreased activity tolerance, Decreased mobility, Fall risk, Gait deviation, Impaired balance, Pain, Decreased endurance, Decreased coordination  Assessment: Pt highly motivated  to wash hair and shave her legs this session. Pt's mother helpful and attentive throughout session. pt encouraged to complete as much of task as she can independently. Pt verbalized understanding. Pt continues to benefit from skilled acute OT; will continue to follow  Today's Interventions: ADL retraining, Balance activities, Bed mobility, Compensatory tech. training, Conservation, Education - Patient, Education - Family / caregiver, Endurance activities, Safety education  Chlorhexidine Treatment: Given  Today's Interventions: Educated on role of OT, OT POC, anxiety management, PLB, activity pacing, seated rest breaks. Patient performed bed mobility, sit<>stand transitions, step up/down, functional mobility, RW management            Mobility:   Bed Mobility comments: Supine to sit with SBA, HOB elevated and increased time to complete. Pt utilizing LUE for support but protecting R wrist. Sit to supine with Min A for LEs.        Skilled Treatment Performed: significantly slow gait speed, step through pattern. occasional toe drag with foot clearance, significant BUE reliance on RW. Pt very tense & rigid during ambulation. tactile & vc's to decrease shoulder elevation & maintain appropriate distance from RW  PT Post Acute Discharge Recommendations: Skilled PT services indicated, 5x weekly, High intensity  Cognition, Swallow, Speech:   Cognition / Swallow / Speech  Patient's Vision Adequate to Safely Complete Daily Activities: Yes  Patient's Judgement Adequate to Safely Complete Daily Activities: Yes  Patient's Memory Adequate to Safely Complete Daily Activities: Yes  Patient Able to Express Needs/Desires: Yes  Patient has speech problem: No           Assistive Devices: Rollator (Recliner)  Precautions:  Safety Interventions  Safety Interventions: aspiration precautions, bleeding precautions, commode/urinal/bedpan at bedside, fall reduction program maintained, family at bedside, infection management, lighting adjusted for tasks/safety, low bed, nonskid shoes/slippers when out of bed  Aspiration Precautions: respiratory status monitored  Bleeding Precautions: monitored for signs of bleeding  Infection Management: aseptic technique maintained  Isolation Precautions: protective precautions maintained    Functional Impairments and Recommendations  - Please continue to have patient work with PT, OT, and SLP to maximize functional status with mobility, ADLs, and cogniton/swallow function.  - Fall risk precautions  - Recommend OOB to chair daily with assistance if appropriate    Rehabilitation Plan of Care  - Patient has complex rehab, nursing, and medical needs and may become appropriate for Acute Inpatient Rehabilitation with regular therapy participation / demonstrated progress, completion of medical workup / formulation of treatment plan, and once transitioned exclusively to oral pain medication.  In AIR the patient would be expected to tolerate minimum of 3 hrs of therapy daily, 5x/week.  -Discussed rehab options today with patient and patient's mother. The patient/family/caregiver was counseled and educated on the purpose of Acute Inpatient Rehabilitation and questions/concerns were addressed.     In order for the patient to be considered for admission to [facility] inpatient rehab, the case manager must give the patient / family choice in post-acute discharge options. If the patient desires [place], a referral from case management is required for acceptance to [facility] inpatient rehab.  A referral for [facility] inpatient rehab has not been received.     Please page the consult pager from 8AM-5PM weekdays or the first call pager after hours/weekends/holidays with questions.  The patient was seen and examined with Dr. Landry Dyke, MD, who is in agreement with the above stated assessment and plan.    Thank you for this consult    SUBJECTIVE  Reason for Consult: Patient seen in consultation at the request of Kateri Plummer, MD for evaluation of rehabilitation needs and recommendations.    History of Present Illness: [name] [name] is a 22 y.o. female currently hospitalized on 09/25/2022 for ileus and inability to tolerate PO intake.    Today  Patient reportedly feeling okay this morning. Reports good pain control with current medications. She notes she has had several hospitalizations in the past but is typically able to improve functionally despite her medical ailments. Unfortunately, she reports worsening deconditioning and difficulty with mobilizing. She reports her increased tone began after a procedure. It is continuous but worsens intermittently with mobility. She also is having neck tightness. She was cleared for a full liquid diet but has not yet tried to eat. Otherwise, she reports headache that has been going on since prior to her admission. She notes a component of light and sound sensitivity. 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     Medical / Surgical History:   Past Medical History:   Diagnosis Date    Abdominal pain     Acid reflux     occas    Anesthesia complication     itching, shaking, coldness; last few surgeries have gone much better    Cancer (CMS-HCC)     Cataract of right eye     COVID-19 virus infection 01/2019    Cyst of thyroid determined by ultrasound     monitoring    [name]     2 right forearm, 1 left thigh, 1 right scapula, 1 under left clavicle; multiple    Difficult intravenous access     [name] (familial adenomatous polyposis)     [name]     Gastric polyps     History of chemotherapy     last treatment approx 05/2019    History of colon polyps     History of COVID-19 01/2019    Iron deficiency anemia due to chronic blood loss     received iron infusion 11-2019    PONV (postoperative nausea and vomiting)     Rectal bleeding     Syncopal episodes     especially if becoming dehydrated     Past Surgical History:   Procedure Laterality Date    COLON SURGERY      cyroablation      cystis removal      desmoid removal      PR CLOSE ENTEROSTOMY,RESEC+ANAST N/A 10/09/2020    Procedure: ILEOSTOMY TAKEDOWN;  Surgeon: Mickle Asper, MD;  Location: OR [place];  Service: General Surgery    PR COLONOSCOPY W/BIOPSY SINGLE/MULTIPLE N/A 10/27/2012    Procedure: COLONOSCOPY, FLEXIBLE, PROXIMAL TO SPLENIC FLEXURE; WITH BIOPSY, SINGLE OR MULTIPLE;  Surgeon: Shirlyn Goltz Mir, MD;  Location: PEDS PROCEDURE ROOM [place];  Service: Gastroenterology    PR COLONOSCOPY W/BIOPSY SINGLE/MULTIPLE N/A 09/14/2013    Procedure: COLONOSCOPY, FLEXIBLE, PROXIMAL TO SPLENIC FLEXURE; WITH BIOPSY, SINGLE OR MULTIPLE;  Surgeon: Shirlyn Goltz Mir, MD;  Location: PEDS PROCEDURE ROOM [place];  Service: Gastroenterology    PR COLONOSCOPY W/BIOPSY SINGLE/MULTIPLE N/A 11/08/2014    Procedure: COLONOSCOPY, FLEXIBLE, PROXIMAL TO SPLENIC FLEXURE; WITH BIOPSY, SINGLE OR MULTIPLE;  Surgeon: Arnold Long Mir, MD;  Location: PEDS PROCEDURE ROOM [facility];  Service: Gastroenterology    PR COLONOSCOPY W/BIOPSY SINGLE/MULTIPLE N/A 12/26/2015    Procedure: COLONOSCOPY, FLEXIBLE, PROXIMAL TO SPLENIC FLEXURE; WITH BIOPSY, SINGLE OR MULTIPLE;  Surgeon: Arnold Long Mir, MD;  Location: PEDS  PROCEDURE ROOM [facility];  Service: Gastroenterology    PR COLONOSCOPY W/BIOPSY SINGLE/MULTIPLE N/A 09/02/2017    Procedure: COLONOSCOPY, FLEXIBLE, PROXIMAL TO SPLENIC FLEXURE; WITH BIOPSY, SINGLE OR MULTIPLE;  Surgeon: Arnold Long Mir, MD;  Location: PEDS PROCEDURE ROOM [facility];  Service: Gastroenterology    PR COLSC FLX W/REMOVAL LESION BY HOT BX FORCEPS N/A 08/27/2016    Procedure: COLONOSCOPY, FLEXIBLE, PROXIMAL TO SPLENIC FLEXURE; W/REMOVAL TUMOR/POLYP/OTHER LESION, HOT BX FORCEP/CAUTE;  Surgeon: Arnold Long Mir, MD;  Location: PEDS PROCEDURE ROOM [facility];  Service: Gastroenterology    PR COLSC FLX W/RMVL OF TUMOR POLYP LESION SNARE TQ N/A 02/25/2019    Procedure: COLONOSCOPY FLEX; W/REMOV TUMOR/LES BY SNARE;  Surgeon: Helyn Numbers, MD;  Location: GI PROCEDURES [place] [facility];  Service: Gastroenterology    PR COLSC FLX W/RMVL OF TUMOR POLYP LESION SNARE TQ N/A 03/13/2020    Procedure: COLONOSCOPY FLEX; W/REMOV TUMOR/LES BY SNARE;  Surgeon: Helyn Numbers, MD;  Location: [facility]EDURES [place] [facility];  Service: Gastroenterology    PR EXC SKIN BENIG 2.1-3 CM TRUNK,ARM,LEG Right 02/25/2017    Procedure: EXCISION, BENIGN LESION INCLUDE MARGINS, EXCEPT SKIN TAG, LEGS; EXCISED DIAMETER 2.1 TO 3.0 CM;  Surgeon: Clarene Duke, MD;  Location: CHILDRENS OR [facility];  Service: Plastics    PR EXC SKIN BENIG 3.1-4 CM TRUNK,ARM,LEG Right 02/25/2017    Procedure: EXCISION, BENIGN LESION INCLUDE MARGINS, EXCEPT SKIN TAG, ARMS; EXCISED DIAMETER 3.1 TO 4.0 CM;  Surgeon: Clarene Duke, MD;  Location: CHILDRENS OR [facility];  Service: Plastics    PR EXC SKIN BENIG >4 CM FACE,FACIAL Right 02/25/2017    Procedure: EXCISION, OTHER BENIGN LES INCLUD MARGIN, FACE/EARS/EYELIDS/NOSE/LIPS/MUCOUS MEMBRANE; EXCISED DIAM >4.0 CM;  Surgeon: Clarene Duke, MD;  Location: CHILDRENS OR [facility];  Service: Plastics    PR EXC TUMOR SOFT TISSUE LEG/ANKLE SUBQ 3+CM Right 08/05/2019    Procedure: EXCISION, TUMOR, SOFT TISSUE OF LEG OR ANKLE AREA, SUBCUTANEOUS; 3 CM OR GREATER;  Surgeon: Arsenio Katz, MD;  Location: MAIN OR [place];  Service: Plastics    PR EXC TUMOR SOFT TISSUE LEG/ANKLE SUBQ <3CM Right 08/05/2019    Procedure: EXCISION, TUMOR, SOFT TISSUE OF LEG OR ANKLE AREA, SUBCUTANEOUS; LESS THAN 3 CM;  Surgeon: Arsenio Katz, MD;  Location: MAIN OR [facility];  Service: Plastics    PR LAP, SURG PROCTECTOMY W J-POUCH N/A 08/10/2020    Procedure: ROBOTIC ASSISTED LAPAROSCOPIC PROCTOCOLECTOMY, ILEAL J POUCH, WITH OSTOMY;  Surgeon: Mickle Asper, MD;  Location: OR [place];  Service: General Surgery    PR NDSC EVAL INTSTINAL POUCH DX W/COLLJ SPEC SPX N/A 01/23/2021    Procedure: ENDO EVAL SM INTEST POUCH; DX;  Surgeon: Modena Nunnery, MD;  Location: [facility] PROCEDURES [place] [facility];  Service: Gastroenterology    PR NDSC EVAL INTSTINAL POUCH DX W/COLLJ SPEC SPX N/A 08/27/2021    Procedure: ENDO EVAL SM INTEST POUCH; DX;  Surgeon: Hunt Oris, MD;  Location: [facility] PROC[facility]URES [facility];  Service: Gastroenterology    PR NDSC EVAL INTSTINAL POUCH DX W/COLLJ SPEC SPX N/A 12/09/2021    Procedure: ENDO EVAL SM INTEST POUCH; DX;  Surgeon: Vidal Schwalbe, MD;  Location: GI PROCEDURES [facility];  Service: Gastroenterology    PR NDSC EVAL INTSTINAL POUCH DX W/COLLJ [facility] SPX Left 04/09/2022    Procedure: ENDO EVAL SM INTEST POUCH; DX;  Surgeon: Modena Nunnery, MD;  Location: [place] [facility]URES [place] [facility];  Service: Gastroenterology    PR NDSC EVAL INTSTINAL POUCH DX W/COLLJ SPEC SPX N/A 08/05/2022  Procedure: ENDO EVAL SM INTEST POUCH; DX;  Surgeon: Modena Nunnery, MD;  Location: GI PROCEDURES [facility];  Service: Gastroenterology    PR NDSC EVAL INTSTINAL POUCH W/BX SINGLE/MULTIPLE N/A 01/20/2022    Procedure: ENDOSCOPIC EVAL OF SMALL INTESTINAL POUCH; DIAGNOSTIC, No biopsies;  Surgeon: Andrey Farmer, MD;  Location: GI PROCEDURES [facility];  Service: Gastroenterology    PR NDSC EVAL INTSTINAL POUCH W/BX SINGLE/MULTIPLE N/A 02/13/2022    Procedure: ENDOSCOPIC EVAL OF SMALL INTESTINAL POUCH; DIAGNOSTIC, WITH BIOPSY;  Surgeon: Bronson Curb, MD;  Location: GI PROCEDURES [facility];  Service: Gastroenterology    PR UNLISTED PROCEDURE SMALL INTESTINE  01/23/2021    Procedure: UNLISTED PROCEDURE, SMALL INTESTINE;  Surgeon: Modena Nunnery, MD;  Location: [facility] PROCEDURES [place] [facility];  Service: Gastroenterology    PR UNLISTED PROCEDURE SMALL INTESTINE  02/13/2022    Procedure: UNLISTED PROCEDURE, SMALL INTESTINE;  Surgeon: Bronson Curb, MD;  Location: GI PROCEDURES [facility];  Service: Gastroenterology    PR UPPER GI ENDOSCOPY,BIOPSY N/A 10/27/2012    Procedure: UGI ENDOSCOPY; WITH BIOPSY, SINGLE OR MULTIPLE;  Surgeon: Shirlyn Goltz Mir, MD;  Location: PEDS PROCEDURE ROOM [facility];  Service: Gastroenterology    PR UPPER GI ENDOSCOPY,BIOPSY N/A 09/14/2013    Procedure: UGI ENDOSCOPY; WITH BIOPSY, SINGLE OR MULTIPLE;  Surgeon: Shirlyn Goltz Mir, MD;  Location: PEDS PROCEDURE ROOM [facility];  Service: Gastroenterology    PR UPPER GI ENDOSCOPY,BIOPSY N/A 11/08/2014    Procedure: UGI ENDOSCOPY; WITH BIOPSY, SINGLE OR MULTIPLE;  Surgeon: Arnold Long Mir, MD;  Location: PEDS PROCEDURE ROOM [facility];  Service: Gastroenterology    PR UPPER GI ENDOSCOPY,BIOPSY N/A 12/26/2015    Procedure: UGI ENDOSCOPY; WITH BIOPSY, SINGLE OR MULTIPLE;  Surgeon: Arnold Long Mir, MD;  Location: PEDS PROCEDURE ROOM [facility];  Service: Gastroenterology    PR UPPER GI ENDOSCOPY,BIOPSY N/A 08/27/2016    Procedure: UGI ENDOSCOPY; WITH BIOPSY, SINGLE OR MULTIPLE;  Surgeon: Arnold Long Mir, MD;  Location: PEDS PROCEDURE ROOM [facility];  Service: Gastroenterology    PR UPPER GI ENDOSCOPY,BIOPSY N/A 09/02/2017    Procedure: UGI ENDOSCOPY; WITH BIOPSY, SINGLE OR MULTIPLE;  Surgeon: Arnold Long Mir, MD;  Location: PEDS PROCEDURE ROOM [facility];  Service: Gastroenterology    PR UPPER GI ENDOSCOPY,BIOPSY N/A 03/13/2020    Procedure: UGI ENDOSCOPY; WITH BIOPSY, SINGLE OR MULTIPLE;  Surgeon: Helyn Numbers, MD;  Location: [facility] PROCEDURES [place] [facility];  Service: Gastroenterology    PR UPPER GI ENDOSCOPY,BIOPSY N/A 09/05/2021    Procedure: UGI ENDOSCOPY; WITH BIOPSY, SINGLE OR MULTIPLE;  Surgeon: Wendall Papa, MD;  Location: GI PROCEDURES [facility];  Service: Gastroenterology    PR UPPER GI ENDOSCOPY,DIAGNOSIS N/A 01/20/2022    Procedure: UGI ENDO, INCLUDE ESOPHAGUS, STOMACH, & DUODENUM &/OR JEJUNUM; DX W/WO COLLECTION SPECIMN, BY BRUSH OR WASH;  Surgeon: Andrey Farmer, MD;  Location: GI PROCEDURES [facility];  Service: Gastroenterology    TUMOR REMOVAL      multiple-head, neck, back, hand, right flank, multiple        Social History:   Social History     Tobacco Use    Smoking status: Never     Passive exposure: Past    Smokeless tobacco: Never   Vaping Use    Vaping status: Never Used   Substance Use Topics    Alcohol use: Never    Drug use: Never       Living Environment: House  Lives With: Mother, Father  Home Living: Multi-level home, Bed/bath upstairs, 1/2 bath on main  level, Stairs to alternate level with rails, Stairs to enter without rails, Stairs to alternate level without rails, Handicapped height toilet, Tub/shower unit, Able to Live on main level with bedroom/bathroom  Number of Stairs to Enter (outside): 2  Rail placement (inside): Rail on right side  Number of Stairs to Alternate level (inside): 14  Caregiver Identified?: Yes  Caregiver Availability: 24 hours  Caregiver Ability: Limited lifting    Family History: Reviewed  family history includes Alcohol abuse in her paternal grandfather; Arthritis in her maternal grandfather; Asthma in her maternal grandfather; COPD in her paternal grandmother; Cancer in her maternal grandmother; Diabetes in her maternal grandmother; Hypertension in her maternal grandmother; Miscarriages / [place] in her paternal grandmother; No Known Problems in her brother, father, maternal aunt, maternal uncle, mother, paternal aunt, paternal uncle, and sister; Other in her maternal grandmother; Stroke in her maternal grandmother; Thyroid disease in her maternal grandmother.    Allergies:   Adhesive tape-silicones; Ferrlecit [sodium ferric gluconat-sucrose]; Levofloxacin; Methylnaltrexone; Neomycin; Papaya; Morphine; Zosyn [piperacillin-tazobactam]; Compazine [prochlorperazine]; Iron analogues; Iron dextran; and Latex, natural rubber    Medications:   Scheduled    baclofen (LIORESAL) tablet 10 mg BID    bisacodyl (DULCOLAX) suppository 10 mg BID    buprenorphine-naloxone (SUBOXONE) 2-0.5 mg SL film 0.5 mg of buprenorphine TID    [Provider Hold] cefdinir (OMNICEF) capsule 300 mg Q12H SCH    cetirizine (ZYRTEC) tablet 20 mg BID    cosyntropin (CORTROSYN) injection 0.25 mg Once    [Provider Hold] doxycycline (VIBRA-TABS) tablet 100 mg BID    famotidine (PF) (PEPCID) injection 20 mg At bedtime    pantoprazole (Protonix) injection 40 mg BID     PRN acetaminophen, 650 mg, Q6H PRN  calcium carbonate, 400 mg elem calcium, Daily PRN  fat emulsion 20 %, 100 mL, Once PRN  fat emulsion 20 %, 200 mL, Once PRN  guaiFENesin, 200 mg, Q4H PRN  LORazepam, 0.5 mg, TID PRN  ondansetron, 4 mg, Q6H PRN  phenol, 2 spray, Q2H PRN  [Provider Hold] polyethylene glycol, 17 g, Daily PRN  promethazine, 6.5 mg, Q6H PRN  zinc oxide-cod liver oil, , Daily PRN      Continuous Infusions Parenteral Nutrition (CENTRAL)   And  fat emulsion 20 % with fish oil  ketamine, Last Rate: 0.25 mg/kg/hr (10/15/22 0400)  Parenteral Nutrition (CENTRAL), Last Rate: 45 mL/hr at 10/14/22 2303  sodium chloride  sodium chloride 0.45 %, Last Rate: 100 mL/hr (10/15/22 0750)        OBJECTIVE     Vitals:  Temp:  [36.6 ??C (97.8 ??F)-37.1 ??C (98.8 ??F)] 36.6 ??C (97.9 ??F)  Heart Rate:  [77-94] 81  SpO2 Pulse:  [76-95] 81  Resp:  [9-23] 10  BP: (105-130)/(50-77) 105/51  MAP (mmHg):  [67-87] 67  FiO2 (%):  [2 %] 2 %  SpO2:  [98 %-100 %] 99 %  Vitals:    10/14/22 2311 10/15/22 0315 10/15/22 0400 10/15/22 0800   BP: 130/65  114/61 105/51   Pulse: 90  77 81   Resp: 23  16 10    Temp: 37 ??C (98.6 ??F) 37.1 ??C (98.8 ??F)  36.6 ??C (97.9 ??F)   TempSrc: Oral Axillary  Oral   SpO2: 99%  100% 99%   Weight:       Height:           Patient Lines/Drains/Airways Status       Active Peripheral & Central Intravenous Access       Name  Placement date Placement time Site Days    Peripheral IV 10/15/22 Anterior;Right Forearm 10/15/22  0254  Forearm  less than 1    CVC Double Lumen 01/10/22 Tunneled Right Internal jugular 01/10/22  1353  Internal jugular  277                  Patient Lines/Drains/Airways Status       Active Wounds       None                    Physical Exam:    GEN: Lying in bed in NAD.  HEENT: Normocephalic. Moist mucous membranes. NGT in place. Clamped   RESP: NWOB on RA.  CV: Warm and well-perfused.  GI: non distended   GU: deferred  SKIN: without lesions, bruises, ecchymosis, rashes on clothed examination  MSK: TTP to bilateral knees (R>L) with passive/active range of motion   NEURO:  Mental Status: A&Ox4, attention intact, speech fluid and coherent, follows commands well and answers questions appropriately  Sensory: Sensory is intact to light touch with exception of left ulnar distribution involving forearm & 4/5th digits  Motor:      Delt Bi Tri WE HG  RUE 5 5 5 5 4     LUE 5 5 5 5 4    Finger Abd & flexion 4 on right                HF KE       DF       PF        RLE     3 3 5 5               LLE   3 3 5 5     Knee flexion 3   Proximal lower extremity exam limited by pain    Abbreviations:   Delt= Deltoid, Bi= Biceps, Tri= Triceps, WF= Wrist flexor, WE= Wrist extensor, HG= Hand Grip  HF= Hip flexor, KE= Knee Extensor, DF= Dorsiflexor, PF= Plantar Flexor, EHL= Extensor Hallicus Longus    Tone: increased tone most prominent to bilateral lower extremities with hip flexion & knee extension, tone is variable, there is some tone to bilateral ankles, patient in plantar flexion at rest to bilateral ankles  Reflexes: no clonus    Labs and Diagnostic Studies: Reviewed     Radiology Results: Reviewed   Reviewed MRI CTL    ECG 12 Lead    Result Date: 10/15/2022  NORMAL SINUS RHYTHM NORMAL ECG WHEN COMPARED WITH ECG OF 14-Oct-2022 10:59, NO SIGNIFICANT CHANGE WAS FOUND    ECG 12 Lead    Result Date: 10/14/2022  NORMAL SINUS RHYTHM CANNOT RULE OUT ANTERIOR INFARCT  (CITED ON OR BEFORE 29-Sep-2022) ABNORMAL ECG WHEN COMPARED WITH ECG OF 29-Sep-2022 16:49, PREMATURE ATRIAL BEATS ARE NO LONGER PRESENT Confirmed by Warnell Forester ([id number]) on 10/14/2022 4:34:05 PM    XR Chest Portable    Result Date: 10/13/2022  EXAM: XR CHEST PORTABLE ACCESSION: [id number]     CLINICAL INDICATION: Cough.     TECHNIQUE: Single View AP Chest Radiograph.     COMPARISON: July 19.     FINDINGS:     Enteric tube in the stomach with central line tip cavoatrial junction. The lungs remain clear with no pleural fluid or pneumothorax.                 New enteric tube in the stomach. No other change.  Haydee Monica, MD PGY-3  Physical Medicine and Rehabilitation

## 2022-10-15 NOTE — Unmapped (Signed)
VENOUS ACCESS ULTRASOUND PROCEDURE NOTE    Indications:   Poor venous access.    The Venous Access Team has assessed this patient for the placement of a PIV. Ultrasound guidance was necessary to obtain access.     Procedure Details:  Identity of the patient was confirmed via name, medical record number and date of birth. The availability of the correct equipment was verified.    The vein was identified for ultrasound catheter insertion.  Field was prepared with necessary supplies and equipment.  Probe cover and sterile gel utilized.  Insertion site was prepped with chlorhexidine solution and allowed to dry.  The catheter extension was primed with normal saline. A(n) 22 gauge 1.75" catheter was placed in the R Forearm with 1attempt(s). See LDA for additional details.    Catheter aspirated, 2 mL blood return present. The catheter was then flushed with 10 mL of normal saline. Insertion site cleansed, and dressing applied per manufacturer guidelines. The catheter was inserted with difficulty due to poor vasculature by Jadden Yim B Alverna Fawley, RN.      RN was notified.     Thank you,     Addie Alonge B Lulu Hirschmann, RN Venous Access Team   [phone]     Workup / Procedure Time:  30 minutes    See images below:

## 2022-10-16 LAB — MAGNESIUM: MAGNESIUM: 2 mg/dL (ref 1.6–2.6)

## 2022-10-16 LAB — CBC
HEMATOCRIT: 27.4 % — ABNORMAL LOW (ref 34.0–44.0)
HEMOGLOBIN: 8.9 g/dL — ABNORMAL LOW (ref 11.3–14.9)
MEAN CORPUSCULAR HEMOGLOBIN CONC: 32.6 g/dL (ref 32.0–36.0)
MEAN CORPUSCULAR HEMOGLOBIN: 25.9 pg (ref 25.9–32.4)
MEAN CORPUSCULAR VOLUME: 79.6 fL (ref 77.6–95.7)
MEAN PLATELET VOLUME: 10.7 fL (ref 6.8–10.7)
PLATELET COUNT: 222 10*9/L (ref 150–450)
RED BLOOD CELL COUNT: 3.44 10*12/L — ABNORMAL LOW (ref 3.95–5.13)
RED CELL DISTRIBUTION WIDTH: 13.4 % (ref 12.2–15.2)
WBC ADJUSTED: 5.6 10*9/L (ref 3.6–11.2)

## 2022-10-16 LAB — COMPREHENSIVE METABOLIC PANEL
ALBUMIN: 3.2 g/dL — ABNORMAL LOW (ref 3.4–5.0)
ALKALINE PHOSPHATASE: 80 U/L (ref 46–116)
ALT (SGPT): 204 U/L — ABNORMAL HIGH (ref 10–49)
ANION GAP: 6 mmol/L (ref 5–14)
AST (SGOT): 123 U/L — ABNORMAL HIGH (ref <=34)
BILIRUBIN TOTAL: 0.2 mg/dL — ABNORMAL LOW (ref 0.3–1.2)
BLOOD UREA NITROGEN: 13 mg/dL (ref 9–23)
BUN / CREAT RATIO: 29
CALCIUM: 8.6 mg/dL — ABNORMAL LOW (ref 8.7–10.4)
CHLORIDE: 108 mmol/L — ABNORMAL HIGH (ref 98–107)
CO2: 28 mmol/L (ref 20.0–31.0)
CREATININE: 0.45 mg/dL — ABNORMAL LOW
EGFR CKD-EPI (2021) FEMALE: >90 mL/min/{1.73_m2} (ref >=60)
GLUCOSE RANDOM: 97 mg/dL (ref 70–179)
POTASSIUM: 3.7 mmol/L (ref 3.4–4.8)
PROTEIN TOTAL: 5.6 g/dL — ABNORMAL LOW (ref 5.7–8.2)
SODIUM: 142 mmol/L (ref 135–145)

## 2022-10-16 LAB — PHOSPHORUS: PHOSPHORUS: 4.2 mg/dL (ref 2.4–5.1)

## 2022-10-16 MED ADMIN — senna (SENOKOT) tablet 2 tablet: 2 | ORAL | @ 14:00:00 | Stop: 2022-10-16

## 2022-10-16 MED ADMIN — Parenteral Nutrition (CENTRAL): INTRAVENOUS | @ 02:00:00 | Stop: 2022-10-16

## 2022-10-16 MED ADMIN — baclofen (LIORESAL) tablet 10 mg: 10 mg | ORAL | @ 19:00:00

## 2022-10-16 MED ADMIN — famotidine (PF) (PEPCID) injection 20 mg: 20 mg | INTRAVENOUS | @ 02:00:00

## 2022-10-16 MED ADMIN — promethazine (PHENERGAN) 6.5 mg in sodium chloride (NS) 0.9 % 50 mL IVPB: 6.5 mg | INTRAVENOUS | @ 01:00:00

## 2022-10-16 MED ADMIN — nirogacestat (OGSIVEO) tablet 50 mg *Patient Supplied*: 50 mg | ORAL | @ 20:00:00

## 2022-10-16 MED ADMIN — acetaminophen (TYLENOL) tablet 650 mg: 650 mg | GASTROENTERAL | @ 17:00:00

## 2022-10-16 MED ADMIN — ondansetron (ZOFRAN) injection 4 mg: 4 mg | INTRAVENOUS | @ 20:00:00

## 2022-10-16 MED ADMIN — baclofen (LIORESAL) tablet 15 mg: 15 mg | ORAL | @ 14:00:00 | Stop: 2022-10-16

## 2022-10-16 MED ADMIN — pantoprazole (Protonix) injection 40 mg: 40 mg | INTRAVENOUS | @ 15:00:00

## 2022-10-16 MED ADMIN — promethazine (PHENERGAN) 6.5 mg in sodium chloride (NS) 0.9 % 50 mL IVPB: 6.5 mg | INTRAVENOUS | @ 14:00:00

## 2022-10-16 MED ADMIN — fat emulsion 20 % with fish oil (SMOFlipid) infusion 250 mL: 250 mL | INTRAVENOUS | @ 02:00:00 | Stop: 2022-10-16

## 2022-10-16 MED ADMIN — buprenorphine-naloxone (SUBOXONE) 2-0.5 mg SL film 0.5 mg of buprenorphine: .25 | SUBLINGUAL | @ 02:00:00

## 2022-10-16 MED ADMIN — oxyCODONE (ROXICODONE) immediate release tablet 10 mg: 10 mg | ORAL | @ 20:00:00 | Stop: 2022-10-16

## 2022-10-16 MED ADMIN — buprenorphine-naloxone (SUBOXONE) 2-0.5 mg SL film 0.5 mg of buprenorphine: .25 | SUBLINGUAL | @ 19:00:00

## 2022-10-16 MED ADMIN — sodium chloride 0.45% (1/2 NS) infusion: 100 mL/h | INTRAVENOUS | @ 08:00:00

## 2022-10-16 MED ADMIN — pantoprazole (Protonix) injection 40 mg: 40 mg | INTRAVENOUS | @ 02:00:00

## 2022-10-16 MED ADMIN — cetirizine (ZYRTEC) tablet 20 mg: 20 mg | GASTROENTERAL | @ 14:00:00

## 2022-10-16 MED ADMIN — ketamine 500 mg in sodium chloride 0.9 % 250 mL (2 mg/mL): .25 mg/kg/h | INTRAVENOUS | Stop: 2022-10-17

## 2022-10-16 MED ADMIN — baclofen (LIORESAL) tablet 10 mg: 10 mg | ORAL | @ 05:00:00 | Stop: 2022-10-16

## 2022-10-16 MED ADMIN — buprenorphine-naloxone (SUBOXONE) 2-0.5 mg SL film 0.5 mg of buprenorphine: .25 | SUBLINGUAL | @ 16:00:00

## 2022-10-16 MED ADMIN — LORazepam (ATIVAN) tablet 0.5 mg: .5 mg | ORAL | @ 16:00:00

## 2022-10-16 MED ADMIN — oxyCODONE (ROXICODONE) immediate release tablet 15 mg: 15 mg | ORAL | @ 02:00:00 | Stop: 2022-10-15

## 2022-10-16 MED ADMIN — cetirizine (ZYRTEC) tablet 20 mg: 20 mg | GASTROENTERAL | @ 05:00:00

## 2022-10-16 MED ADMIN — mirtazapine (REMERON) tablet 15 mg: 15 mg | GASTROENTERAL | @ 05:00:00

## 2022-10-16 NOTE — Unmapped (Signed)
Brief Oncology Note    Reason for Visit:   [name] [name] is a 22 y.o. with [name], mesenteric desmoid tumors, malnutrition previously requiring TPN, chronic tumor related pain, depression, anxiety, currently admitted with ileus and muscle stiffness after procedural anesthesia.    Discussion/Assessment:   Reviewed her complex and challenging hospital course to date. Provided empathic listening and support for this difficult experience. I do not have a complete understanding of the driver of her muscle stiffness and profound weakness, I see her recent neurologic consultation and normal MRI spine - these are reassuring to me. I do not know much about the PQ CA antibody, I will review this in more detail but I suspect it is not a primary driver of her symptoms.    I think it is conceivable this was all a uniquely severe reaction to inadequate analgesia and/or a heightened stress response in the setting of her recent procedure, along with profound deconditioning and several successive medical complications (prior hospital stay with severe pain, egg retrieval process which involved several procedures and high dose hormone supplementation, new anti-tumor medication). However, this is admittedly imprecise for everything she has been through.    We discussed her desmoid specifically and its relation to all of her current issues. It continues to tether her bowel and is likely contributing to her ileus, along with her history of numerous bowel surgeries and episodes of pouchitis, opioids, etc. She is ready to resume a trial of nirogacestat, we will start low and go slow given her sensitivity to medications in the past.    Plan:   -OK to resume nirogacestat while in house. Would start with a low dose (50mg  BID, normal dose is 150mg  BID). Common side effects include diarrhea, headache, hypophosphatemia, rash. Orders placed.  -consider PM&R consultation to support functional recovery  -appreciate excellent primary service care  -agree with plans for further workup of syncope / hypotension  -consider follow up with neurology given autonomic dysfunction type picture without clear etiology - this is not consistent with desmoid fibromatosis  -encourage continued good pain control, appreciate excellent consultative input from pain service  -grateful for continued excellent psychosocial support from [facility] LCSW Swift  -willing to be a part of discussions for a care plan for her in the future - she is also in support of this as her experience has been highly variable from provider to provider and many are not familiar with the high degree of complexity of her conditions. Providers and patient / family alike would benefit from shared expectations.    Donzetta Sprung, MD/MPH  Assistant Professor  Bone and Soft Tissue Oncology Program  [facility]  Pager: [phone]  Nurse Navigator: Roseanne Reno RN, BSN, OCN

## 2022-10-16 NOTE — Unmapped (Signed)
A&Ox4. NSR when at rest and ambulating to the bathroom. ST to the 150s when ambulating in the hallway. Pt walked ~1/4 of the unit hallways. BPs 100s-110s systolic. On 2L [place]. Afebrile. Reporting severe abdominal pain throughout the shift. Reported chest pain/indigestion this morning - MD notified and EKG obtained (NSR). Reported headache in the afternoon - received PRN tylenol. Reporting nausea throughout shift - receiving PRN zofran and phenergan. Pt advanced to full liquids diet - ate several bites of items on tray this afternoon. Adequate UO via toilet. 2 small loose BMs (declined suppository this morning). Ketamine gtt, continuous IV fluids, TPN, lipids infusing. Pt sat in chair this afternoon after walking. Mom at bedside. Free from falls/injury. ROUNDS q2hrs.         Problem: Adult Inpatient Plan of Care  Goal: Plan of Care Review  Outcome: Progressing  Goal: Patient-Specific Goal (Individualized)  Outcome: Progressing  Goal: Absence of Hospital-Acquired Illness or Injury  Outcome: Progressing  Intervention: Identify and Manage Fall Risk  Recent Flowsheet Documentation  Taken 10/15/2022 0800 by Molli Hazard, RN  Safety Interventions:   aspiration precautions   bleeding precautions   commode/urinal/bedpan at bedside   fall reduction program maintained   family at bedside   infection management   lighting adjusted for tasks/safety   low bed   nonskid shoes/slippers when out of bed  Intervention: Prevent and Manage VTE (Venous Thromboembolism) Risk  Recent Flowsheet Documentation  Taken 10/15/2022 1800 by Molli Hazard, RN  Anti-Embolism Device Type: SCD, Knee  Anti-Embolism Intervention: On  Anti-Embolism Device Location: BLE  Taken 10/15/2022 1600 by Molli Hazard, RN  Anti-Embolism Device Type: SCD, Knee  Anti-Embolism Intervention: On  Anti-Embolism Device Location: BLE  Taken 10/15/2022 1400 by Molli Hazard, RN  Anti-Embolism Device Type: SCD, Knee  Anti-Embolism Intervention: On  Anti-Embolism Device Location: BLE  Taken 10/15/2022 1200 by Molli Hazard, RN  Anti-Embolism Device Type: SCD, Knee  Anti-Embolism Intervention: On  Anti-Embolism Device Location: BLE  Taken 10/15/2022 1000 by Molli Hazard, RN  Anti-Embolism Device Type: SCD, Knee  Anti-Embolism Intervention: On  Anti-Embolism Device Location: BLE  Taken 10/15/2022 0800 by Molli Hazard, RN  Anti-Embolism Device Type: SCD, Knee  Anti-Embolism Intervention: On  Anti-Embolism Device Location: BLE  Intervention: Prevent Infection  Recent Flowsheet Documentation  Taken 10/15/2022 0800 by Molli Hazard, RN  Infection Prevention: rest/sleep promoted  Goal: Optimal Comfort and Wellbeing  Outcome: Progressing  Goal: Readiness for Transition of Care  Outcome: Progressing  Goal: Rounds/Family Conference  Outcome: Progressing     Problem: Latex Allergy  Goal: Absence of Allergy Symptoms  Outcome: Progressing     Problem: Fall Injury Risk  Goal: Absence of Fall and Fall-Related Injury  Outcome: Progressing  Intervention: Promote Injury-Free Environment  Recent Flowsheet Documentation  Taken 10/15/2022 0800 by Molli Hazard, RN  Safety Interventions:   aspiration precautions   bleeding precautions   commode/urinal/bedpan at bedside   fall reduction program maintained   family at bedside   infection management   lighting adjusted for tasks/safety   low bed   nonskid shoes/slippers when out of bed     Problem: Skin Injury Risk Increased  Goal: Skin Health and Integrity  Outcome: Progressing  Intervention: Optimize Skin Protection  Recent Flowsheet Documentation  Taken 10/15/2022 0800 by Molli Hazard, RN  Head of Bed [facility]) Positioning: HOB at 30-45 degrees     Problem: Self-Care Deficit  Goal: Improved Ability to Complete Activities of Daily Living  Outcome: Progressing  Problem: Comorbidity Management  Goal: Blood Glucose Levels Within Targeted Range  Outcome: Progressing  Goal: Blood Pressure in Desired Range  Outcome: Progressing

## 2022-10-16 NOTE — Unmapped (Signed)
Hospital Medicine Daily Progress Note    Assessment/Plan:    Principal Problem:    Other acute postprocedural pain  Active Problems:    [name]    Intestinal polyps    [name]    Neoplasm related pain    Physical deconditioning    History of colectomy    Intractable abdominal pain    Generalized anxiety disorder with panic attacks    SBO (small bowel obstruction) (CMS-HCC)    Small intestinal bacterial overgrowth (SIBO)  Resolved Problems:    * No resolved hospital problems. 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    Ileus, multifactorial but with component of narcotic bowel, improving  Inability to tolerate PO, without diagnosis of malnutrition  Continues to have small bowel movements with mucous, variable in number over last three days. Need for intermittent suction has decreased and remains overall low volume. Tolerated full liquids but PO intake more so limited by appetite for the liquids available on the diet. Given ongoing improvement, will liberalize diet further so she has more options.   - Appreciate Chronic Pain recommendations  - SLP following, regular diet recommended. Continue to encourage pills taken orally.   - Advance to soft diet today  - Continue to encourage swallowing pills  - NG tube clamped; avoid LIS unless having severe/intractable nausea/vomiting   - Patient needs to be able to swallow PO meds to have NG tube removed  - continue TPN for nutritional support, reorder daily  - continue bowel regimen with BID suppository, reduce senna to 1 pill  - continue ketamine infusion (8/1 - ), day 6 today. Stop on day 7 per chronic pain.   - discontinued oxycodone and hydromorphone as of 8/3; can consider one-time only doses only for severe pain but patient agrees to try to minimize.     Syncope, uncertain etiology:  Possibly vasovagal vs adverse effect of medications vs deconditioning vs iron deficiency. Has improved with weaning opioids but still occurring, last incident 8/6 (LOC per nurse, witnessed).  Previously worked up for PE, with negative CTA.  Last echo in 04/2022 with normal EF and normal RV function. Cosyntropin stim test and thyroid studies normal. EKG remains sinus.   - adjust benzos, other potentially vasoactive meds as tolerated   - plan for IV iron tomorrow (see below)  - considering low dose beta blockade so will obtain echo for complete work up prior to doing this    Iron deficiency anemia  Ferritin 5, iron saturation 6%.   - Pharmacy assisting with non-formulary iron formulations  - Pre-med protocol per A&I. Recommended one week pre-meds but since inpatient, reasonable to trial after minimum of 48 hours. Patient prefers to proceed with iron as soon as possible, though I did offer to delay 3-4 days to allow for further pre-meds. Started antihistamine 8/7. Plan for steroids to start tonight and IV iron tomorrow morning.   - Antihistamines:   - Continue Cetirizine 20 mg BID  - on the day of infusion, cetirizine 20 qAM, then IV benadryl 50mg  and  famotidine 40 mg IV 30 minutes prior to infusion, then cetirizine 20 mg qPM   - Continue cetirizine 20 mg BID for 72 hours after infusion  - IVF hydration 30 minutes prior to infusion  - Steroids: pretreatment with 50mg  prednisone 13 hours prior to the infusion, 50mg  prednisone 7 hours prior to the infusion, then the solumedrol dose 40mg  (IV dose) prior to the infusion.   - If reaction occurs, tryptase level during the reaction (ideally between 30-90 min of onset of reaction)    Headache, improved  Minimal improvement with IV fluids, magnesium, IV Benadryl. Concerning for rebound/medication overuse headache. Headache has improved with Tylenol holiday and ketamine though not completely resolved.  - Continue IV Tylenol holiday (last dose 7/29) but will resume enteral PRN tylenol  - Consider adding Fioricet daily prn if no improvement with Tylenol holiday  - Avoid NSAIDs, avoid IV Benadryl  - will discuss possible propranolol use based on discussion with PM&R    Muscle spasms and diffuse pain, uncertain etiology  Occurred post-procedurally, with reassuring evaluation (MRI spine, neuro c/s, CK normal).  Repeat CK on 8/6 was also normal.  - Appreciate Chronic Pain recommendations  - Increase baclofen to 10 mg TID  - continue ativan TID prn (PTA med), but caution against escalating (noting risks of concurrent benzos + opiates, possibility of contribution to syncope and other symptoms)  - Consulted PM&R, appreciate assistance.     Coffee-ground emesis, resolved  Noted in NGT 7/21 and 7/31. Most likely due to NG tube trauma and has prior hx of ulcers iso anticoagulation. Hgb stable 9.5-10. GI consulted and recommended conservative management  - monitor CBC q72h  - minimize low-intermittent suction and remove NG once able  - continue IV PPI twice daily    Desmoid tumors - [name]  Spoke with Dr. [name] on 8/6. Will try to restart nirogacestat at low dose. Discussing wit oncology and pharmacist today.     Wet cough  Patient with some coarse breath sounds bilaterally. CXR 8/5 clear.  - Add PRN robitussin  - Encourage incentive spirometry    Chronic/stable/resolved problems:  SIBO: hold home cefdinir, doxy    FEN: Continue small volumes of clear liquids, TPN.   VTE ppx: ambulation  Code status: Full code  Dispo: home with HH PT/OT/TPN vs SNF pending resolution of ileus and successful removal of NG tube    I personally spent 50 minutes face-to-face and non-face-to-face in the care of this patient, which includes all pre, intra, and post visit time on the date of service.  All documented time was specific to the E/M visit and does not include any procedures that may have been performed.  ___________________________________________________________________    Subjective:  Patient used extra dose of oxycodone overnight for pain and headache. She was able to swallow 5 different pills yesterday but felt like one had a harder time going down. She continues to have increased discomfort in her legs and diffuse weakness and really wants to proceed with iron infusion as soon as possible. She tolerated bites of several different full liquids yesterday.    Labs/Studies:  Labs per EMR and Reviewed (last 24hrs)    Objective:  BP 130/64  - Pulse 148  - Temp 37 ??C (98.6 ??F) (Oral)  - Resp 22  - Ht 165.1 cm (5' 5)  - Wt 60.2 kg (132 lb 11.5 oz)  - SpO2 97%  - BMI 22.09 kg/m??     GEN: No acute distress  HEENT: NG tube in place.  Voice stronger.   CV: RRR, nl S1 S2 no m/r/g  ABD: Soft, mildly distended but stable, bowel sounds present in all 4 quadrants  EXT: WWP, no edema.   NEURO: No focal deficits, lower extremities with slightly increased tone  PSYCH: appropriate affect

## 2022-10-16 NOTE — Unmapped (Signed)
Department of Anesthesiology  Pain Medicine Division    Chronic Pain Followup Inpatient Consult Note    Requesting Attending Physician:  Kateri Plummer, MD  Service Requesting Consult:  [facility])    Assessment/Recommendations:  The patient was seen in consultation on request of Rocco Serene, MD regarding assistance with pain management for patient's chronic intractable abdominal pain with now acute on chronic recurrent ileus/NG tube placement.  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      Patient currently established with [facility] pain management center Dr. Dyann Ruddle, has been on oxycodone 10 mg 3 times daily, Suboxone 0.5 mg 3 times daily along with baclofen and pregabalin. Overall goal is to wean to buprenorphine and non-opioid medications and eventually off of buprenorphine as well. Through her psychiatrist she is on Ativan 0.5 mg 3 times daily as needed. She has contracted to avoid taking the Ativan at the same time or close to when she takes her opioid pain medications.    Patient has had multiple prior inpatient stays either regarding acute exacerbation of chronic pain or ileus.  In the past 12 months she has had 2 infusions of ketamine with limited benefit (last infusion in May 2024) and at one point there was some transaminitis which was thought to be attributed from other etiologies like TPN versus other intra-abdominal pathologies.  In the past patient also has had lidocaine infusions during inpatient stay with limited/variable benefit.        Regarding ileus and her constipation, After discussion with the patient and mother we came to an agreement to attempt with a low-dose of naloxegol 12.5 mg, she has taken Alvimopan around surgery time before and seemed to tolerate it. GI team recommended against neostigmine at this time. We expect the naloxegol to increase her abdominal pain at first but hope that we can continue it and even increase to 25mg  if able.     This is no doubt a very complex situation. Her biggest issue at this point seems to be the ileus. Medication management should focus on non-opioid medications, although she has trialed others in past with side effects or minimal benefit.  Hopefully minimizing opioids and working on her ileus will help get her out of the hospital quicker than previously.  Our goal while admitted would be for her to be able to manage her pain without the need for IV opioids and instead take oxycodone up to 15 mg 4 times daily as needed, with the goal of trying to get as close to oxycodone 10 mg 3 times daily as needed as possible.    Her Suboxone is currently increased from her home dose in an attempt to provide optimal basal control of her pain, favoring an increase in buprenorphine which is a partial mu opioid agonist over use of full mu opioid agonists, which can have worse GI side effect profiles.  Again, ultimately the goal would be to wean off of oxy and manage her pain with buprenorphine alone.     Has had lidocaine gtt this admission with most help. Has been avoiding IV HM with success. Has been having HA's c/w tension type headaches with likely rebound/medication over use component.     Interval Events: Patient evaluated at bedside this morning. Reports mixed picture of improvement past 24 hrs. Was able to get up and walk around more yesterday without  passing out or as much shaking. However, diet was progressed to full liquids and any attempts at eating caused nausea, vomiting, and exacerbated abdominal pain. Tried tylenol for pain last night but was not relieved so did ask for dose of oxycodone 15 mg, which did help with pain. Frustrated with herself for requiring opioids when she was been trying to stay off of them. Headaches have overall been improved with ketamine drip. States primary team and PM&R are considering adding propranolol for headaches and tachycardia she experiences with walking. They also increased her baclofen to 15 TID for hypertonicity.    Vital Sign Trends: Vitally stable, afebrile.    Hospital Analgesic Medications:  Baclofen 15 mg TID  Suboxone 1-0.5 mg SL TID  Ketamine 0.25 mg/kg/hr  APAP 650 mg q6 PRN     Recommendations:  -The chronic pain service is a consult service and does not place orders, just makes recommendations (except ketamine and lidocaine infusions)   -Please evaluate all patients on opioids for appropriateness of prescribing narcan at discharge.  The chronic pain service can assist with this.  Nasal narcan is covered by most insurances.  -Recommendations given apply to the current hospitalization and do not reflect long term recommendations.    -Patient is on a low dose ketamine infusion for pain   -Ketamine gtt #3 of 4 maximum over a 12 month period     -All ketamine orders will be managed by the [facility] Chronic Pain Service only-orders can only be placed by attendings and fellows   -Ketamine requires dedicated IV (PIV or central line lumen)   -Continue ketamine infusion: 0.25 mg/kg/hr Ideal body weight   -Patient location: Step down: dose can increase every 4 hours (can decrease anytime)   -Day 6. Started (date): 8/1 (will not count 8/3 against total given near-absence of ketamine given in that time)   -Plan to discontinue tomorrow, 8/9   -Prior to starting: Recent EKG, CMP (creatinine, LFTs), urine pregnancy test (if applicable)   - Daily CMP (creatinine and LFTs daily) while running   -Please contact the Chronic Pain service with any questions or concerns about ketamine infusion.    - Continue IV APAP holiday for concern for rebound headache but may have PRN g-tube doses   - Pt will continue to try to avoid PRN Ativan use  - If headaches are not improved from IV Tylenol holiday, consider Fioricet once daily as needed for headaches  - Continue Baclofen 15 mg TID, increased by PM&R  - Agree with primary team pain and bowel regimen   - Recommend continued physical therapy to work on bed level mobility   - Recommend involvement of inpatient psychology and complex care given patient's distress surrounding hospitalization  -Longterm plan with [name] was to transition back to butrans patch once oral opioids were weaned off, may consider starting this while inpatient once ketamine is discontinued     We will continue to follow.    Naloxone Rx at discharge?  Is patient on opioids? Yes.  1)Is dose >50MME?  Yes.  2) Is patient prescribed a benzodiazepine (w opioids)? Yes.  3)Hx of overdose?  No.  4) Hx of substance use disorder? No.  5) Opioids likely to last greater than a week after discharge? Yes.     If yes to 2 or more, prescribe naloxone at discharge.  Nasal narcan for most insured (Nasal narcan 4mg /actuation, prescribe 1 kit, instructions at [email or web address]).  For uninsured, chronic pain can work to assist in  finding an option.  OTC nasal narcan now available at most pharmacies for around $45.    Interim History  There were no Acute Events Overnight.  The patient is not obtaining adequate pain relief on current medication regimen and feels that their pain is Somewhat controlled    Analgesia Evaluation:  Pain at minimum: 3/10  Pain at maximum: 9/10    Inpatient Medications  Current Facility-Administered Medications   Medication Dose Route Frequency Provider Last Rate Last Admin    acetaminophen (TYLENOL) tablet 650 mg  650 mg Enteral tube: gastric Q6H PRN Kateri Plummer, MD   650 mg at 10/15/22 1719    baclofen (LIORESAL) tablet 15 mg  15 mg Oral TID Kateri Plummer, MD        bisacodyl (DULCOLAX) suppository 10 mg  10 mg Rectal BID Lambert Mody, MD PhD   10 mg at 10/14/22 2055    buprenorphine-naloxone (SUBOXONE) 2-0.5 mg SL film 0.5 mg of buprenorphine  0.25 Film Sublingual TID Lambert Mody, MD PhD   0.5 mg of buprenorphine at 10/15/22 2153    calcium carbonate (TUMS) chewable tablet 400 mg elem calcium  400 mg elem calcium Oral Daily PRN Terri Piedra, AGNP        [Provider Hold] cefdinir (OMNICEF) capsule 300 mg  300 mg Oral Q12H [facility] Rocco Serene, MD        cetirizine (ZYRTEC) tablet 20 mg  20 mg Enteral tube: gastric BID Kateri Plummer, MD   20 mg at 10/16/22 0040    [Provider Hold] doxycycline (VIBRA-TABS) tablet 100 mg  100 mg Oral BID Rocco Serene, MD        famotidine (PF) (PEPCID) injection 20 mg  20 mg Intravenous At bedtime Rocco Serene, MD   20 mg at 10/15/22 2153    fat emulsion 20 % (INTRAlipid) infusion 100 mL  100 mL Intravenous Once PRN Lobonc, Ammie Ferrier, MD        fat emulsion 20 % (INTRAlipid) infusion 200 mL  200 mL Intravenous Once PRN Lobonc, Ammie Ferrier, MD        Parenteral Nutrition (CENTRAL)   Intravenous Continuous Kateri Plummer, MD 45 mL/hr at 10/15/22 2205 New Bag at 10/15/22 2205    And    fat emulsion 20 % with fish oil (SMOFlipid) infusion 250 mL  250 mL Intravenous Continuous Kateri Plummer, MD 20.8 mL/hr at 10/15/22 2205 250 mL at 10/15/22 2205    guaiFENesin (ROBITUSSIN) oral syrup  200 mg Oral Q4H PRN Kateri Plummer, MD        IP OKAY TO TREAT   Other Continuous PRN Sheral Apley, MD        ketamine 500 mg in sodium chloride 0.9 % 250 mL (2 mg/mL)  0.25 mg/kg/hr (Ideal) Intravenous Continuous Nat Christen., MD 7.13 mL/hr at 10/15/22 2000 0.25 mg/kg/hr at 10/15/22 2000    LORazepam (ATIVAN) tablet 0.5 mg  0.5 mg Oral TID PRN Lambert Mody, MD PhD   0.5 mg at 10/15/22 1826    mirtazapine (REMERON) tablet 15 mg  15 mg Enteral tube: gastric Nightly Kateri Plummer, MD   15 mg at 10/16/22 0041    ondansetron (ZOFRAN) injection 4 mg  4 mg Intravenous Q6H PRN Terri Piedra, AGNP   4 mg at 10/15/22 1808    pantoprazole (Protonix) injection 40 mg  40 mg Intravenous BID Rocco Serene, MD   40  mg at 10/15/22 2153    phenol (CHLORASEPTIC) 1.4 % spray 2 spray  2 spray Mucous Membrane Q2H PRN Rocco Serene, MD   2 spray at 10/03/22 0138    [Provider Hold] polyethylene glycol (MIRALAX) packet 17 g  17 g Oral Daily PRN Terri Piedra, AGNP        promethazine (PHENERGAN) 6.5 mg in sodium chloride (NS) 0.9 % 50 mL IVPB  6.5 mg Intravenous Q6H PRN Suzzanne Cloud, MD   Stopped at 10/15/22 2145    senna (SENOKOT) tablet 2 tablet  2 tablet Oral BID Kateri Plummer, MD   2 tablet at 10/15/22 1550    sodium chloride (NS) 0.9 % infusion  10 mL/hr Intravenous Continuous Nat Christen., MD        sodium chloride 0.45% (1/2 NS) infusion  100 mL/hr Intravenous Continuous [name], MD 100 mL/hr at 10/16/22 0345 100 mL/hr at 10/16/22 0345    zinc oxide-cod liver oil (DESITIN 40%) Paste   Topical Daily PRN Arman Filter, MD   Given at 10/13/22 1015     Objective:     Vital Signs    Temp:  [36.4 ??C (97.6 ??F)-36.9 ??C (98.4 ??F)] 36.5 ??C (97.7 ??F)  Heart Rate:  [78-148] 148  SpO2 Pulse:  [78-146] 146  Resp:  [13-22] 22  BP: (105-122)/(53-73) 122/58  MAP (mmHg):  [74-80] 74  SpO2:  [97 %-100 %] 97 %    Physical Exam     GENERAL:  Well developed, well-nourished female in no distress.  HEAD/NECK:    Normocephalic/atraumatic. NGT present.  CARDIOVASCULAR:   WWP, regular tate  LUNGS:   Normal work of breathing, [place]  ABDOMEN  Soft, non-distended.  EXTREMITIES:  Warm and well-perfused.  NEUROLOGIC:    The patient was alert and oriented times four with normal language, attention, cognition and memory. Cranial nerve exam was grossly normal.    MUSCULOSKELETAL:    SKIN:  No obvious rashes lesions or erythema  PSY:  Appropriate affect and mood.    Test Results    Lab Results   Component Value Date    CREATININE 0.45 (L) 10/16/2022     Lab Results   Component Value Date    ALKPHOS 80 10/16/2022    BILITOT 0.2 (L) 10/16/2022    BILIDIR <0.10 10/11/2022    PROT 5.6 (L) 10/16/2022    ALBUMIN 3.2 (L) 10/16/2022    ALT 204 (H) 10/16/2022    AST 123 (H) 10/16/2022     Problem List    Principal Problem:    Other acute postprocedural pain  Active Problems:    [name]    Intestinal polyps    [name]    Neoplasm related pain    Physical deconditioning    History of colectomy    Intractable abdominal pain    Generalized anxiety disorder with panic attacks    SBO (small bowel obstruction) (CMS-HCC)    Small intestinal bacterial overgrowth (SIBO)    Medical Decision Making    Problems Addressed:  1 or more chronic illnesses with severe exacerbation, progression, or side effects of treatment (high)    Amount/Complexity:  Reviewed external records: Primary team progress note  Reviewed results: 8/6 CMP  Ordered tests: none  Discussion of management/test results with medical professional: Primary team  Independent interpretation of test: none    Risk  High: Drug therapy requiring intensive monitoring for toxicity, Decision regarding hospitalization, Elective Major Surgery w/  risk factors, Emergency Major Surgery, Decision to DNR or de-escalate care because of poor prognosis, parental controlled substances (IV ketamine and opioids)    Cloyde Reams, DO  Pain Medicine Fellow  Department of Anesthesiology  [facility]

## 2022-10-16 NOTE — Unmapped (Signed)
[facility]  Follow-Up Psychiatry Consult Note      Date of admission: 09/25/2022 10:30 AM  Service Date: October 16, 2022  Primary Team: [facility])  LOS:  LOS: 19 days      Assessment:   [name] is a 22 y.o. 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 and reported past psych history of GAD and MDD, admitted 09/25/2022 10:30 AM for other acute postprocedural pain following injection of desmoid tumor with steroid and anesthetic under ultrasound guidance.  Patient was seen in consultation by request of hospitalist on [facility] for evaluation of anxiety and depression.      [name] presents with symptoms consistent with a diagnosis of GAD and MDD, consistent with her historical diagnoses. Patient's symptoms appear to have worsened in the context of discontinuation of mirtazapine and ongoing medical stressors, including loss of independence and limited movement in achieving her longer term goals (e.g., completing nursing school). They are consistent with past reports of above diagnoses and include depressed mood, sleep disturbance, fatigue, anhedonia, hopelessness and passive thoughts of death, and increased anxiety with panic/anxiety attacks.     On today's evaluation, pt appears to be more tired than previous evaluations. She reported that she had a difficult morning due to medical condition and feeling of fatigue/lethargy.  Feel hopelessness and discouraged.  She denied SI or urges of self-harm. Pt was able to complete attention testing without error. Currently, her presentation remains consistent with current depressive episode and anxiety is amplified by current medical condition and ongoing hospitalization.  She continues to identify that mirtazapine is helpful.  Have been coordinating with her [facility] outpatient DBT group leader to obtain material/worksheets around recent weeks missed due to hospitalization. Provided patient on the worksheet around mindfulness from week 2.  Unfortunately, it sounds like patient may not be able to continue with the current virtual DBT group (missed 3 sessions out of 4, with policy allowing for 2 absences.  Next session starts in October).  Encouraged pt to respond to group leader's email or my-chart message to touch base about plan going forward with DBT group, as they have not heard back from her.  We recommend no medication changes at this time. We will continue to follow     Diagnoses:   Active Hospital problems:  Principal Problem:    Other acute postprocedural pain  Active Problems:    [name]    Intestinal polyps    [name]    Neoplasm related pain    Physical deconditioning    History of colectomy    Intractable abdominal pain    Generalized anxiety disorder with panic attacks    SBO (small bowel obstruction) (CMS-HCC)    Small intestinal bacterial overgrowth (SIBO)       Problems edited/added by me:  No problems updated.    Risk Assessment:  ASQ screening result: not completed    -A full risk assessment was previously performed on 7/31.  Risk assessment remains essentially unchanged.    Current suicide risk: low risk  Current homicide risk: low risk      Recommendations:     Safety and Observation Level:   -- This patient is not currently under IVC. If safety concerns arise, please page psychiatry for an evaluation.    Medications:  -- Continue mirtazapine 15mg  q2200 for mood and anxiety  -- Continue lorazepam 0.5mg  TID PRN severe anxiety  Further Work-up:   -- No further recommendations at this time from a psychiatric standpoint    Behavioral / Environmental:   -- Utilize compassion and acknowledge the patient's experiences while setting clear and realistic expectations for care.     Follow-up:  -- When patient is discharged, please ensure that their AVS includes information about the 66 Suicide & Crisis Lifeline.  -- The patient currently receives mental health care with [facility] and we will attempt to coordinate followup with our psychiatry team on discharge.  She is also recently established with a virtual DBT group at [facility] ([employer] LCSW)   -- We will follow as needed at this time.     Thank you for this consult request. Recommendations have been communicated to the primary team. Please page [phone]  for any questions or concerns.     Scribe's Attestation: Maxie Better, MD obtained and performed the history, physical exam and medical decision making elements that were entered into the chart.  Signed by Reece Leader, Scribe, on October 16, 2022 at 8:47 AM.    ----------------------------------------------------------------------------------------------------------------------  October 16, 2022 4:33 PM. Documentation assistance provided by the Scribe. I was present during the time the encounter was recorded. The information recorded by the Scribe was done at my direction and has been reviewed and validated by me.    I personally spent 40 minutes face-to-face and non-face-to-face in the care of this patient, which includes all pre, intra, and post visit time on the date of service.  All documented time was specific to the E/M visit and does not include any procedures that may have been performed.    Minus Breeding, MD  ----------------------------------------------------------------------------------------------------------------------        Subjective   Relevant events since last seen by psychiatry on 8/6:   - Per Hospitalist (8/6), pt had severe pain and received oxycodone x 1. In the morning, pt was still having a good bit of nausea right after receiving meds via NG. Mother described persistent dizziness, nausea, and pain with standing yesterday but no syncope.   - On 8/6, pt did loose consciousness this morning after getting back in bed from the bathroom. Pt walked with RN and PT this shift. She did feel pretty bad after and needed her PRN ativan to help with her anxiety.   - Pt's pain is well-controlled with ketamine and PRN Tylenol.   - PT tolerated well  - Continues to endorse nausea that managed with PRN zofran and phenergan.   - Per Pain Management (8/7), pt reports that she had a rough 24 hours. Continues to have episodes of shaking, muscle rigidity, and worsened abdominal pain with walking.   - Per PM&R (8/7), pt reportedly feeling okay this morning. Reports good pain control with current medications.  - Per Hospitalist (8/7), pt reports having a rough night because she didn't get to sleep much   - On 8/7, pt walked ~1/4 of the unit hallways. She was advanced to full liquids diet - ate several bites of items on tray this afternoon.   - Labs  (8/7):Mg 1.9; K 3.7  - EKG (8/7): QTC 425 ms; Fredericia 404 ms; HR 81 BPM    Med Updates:   - Adjusted administration time of mirtazapine 15mg  to q2200 on 8/7    PRNs:  - Lorazepam 0.5 mg TID PRN severe anxiety: 8/6 @1557 ; 8/7 @0353 , 1110, 1826    Patient Interview:  Pt is awake and sitting in bed. She reports that her morning  has been tough, elaborating that this is the worse she has felt. Endorses chills and her skin feeling hot. Pt states that her legs feel heavy despite her wanting to move them. Mother reports that pt is displaying signs of anemia, adding that pt had said her whole body feels like it is shutting down. Pt admits that she is feeling discouraged. Communicates that the thoughts of hopelessness are really bad today. Denies SI, urges of self-harm or AVH.  Describes that she finds it difficult to wake up day by day. She still experiences some restlessness in her sleep, which attributes to the pain. Though, pt states that the Remeron has been beneficial to her sleep. Endorses an intermittent intense and panicking feeling. Pt tries to be a positive person. She collects hospital bands since she was in high school. Finds that placing the bands in a jar helps remind her of how much she has overcame. Pt expresses that today became particularly hard and discouraging when her sister could not drive to visit her. She understands that weather makes the driving conditions dangerous, but was hopeful to see her sister who supports her in ways that others can't. Pt discusses how she feels stuck in life, especially when she is seeing how others are progressing with their lives. MD encourages pt to respond to DBT group leader's messages via email or my-chart to determine plan moving forward and relayed their policy around 2 absences that was communicated to me.  Pt reports that she has yet to look at the DBT worksheets that MD provided earlier today. Completes MOYB w/o error, though notes that she had a more difficult time than normal recalling the months. Pt has no questions at this time.     ROS:   All systems reviewed as negative/unremarkable aside from the following pertinent positives and negatives: anxiety    Collateral:   - Reviewed medical records in Epic    Relevant Updates to past psychiatric, medical/surgical, family, or social history: n/a    Current Medications:  Scheduled Meds:   baclofen  10 mg Oral BID    bisacodyl  10 mg Rectal BID    buprenorphine-naloxone  0.25 Film Sublingual TID    [Provider Hold] cefdinir  300 mg Oral Q12H SCH    cetirizine  20 mg Enteral tube: gastric BID    [Provider Hold] doxycycline  100 mg Oral BID    famotidine (PEPCID) IV  20 mg Intravenous At bedtime    mirtazapine  15 mg Enteral tube: gastric Nightly    pantoprazole (Protonix) intravenous solution  40 mg Intravenous BID    senna  2 tablet Oral BID     Continuous Infusions:   Parenteral Nutrition (CENTRAL)      And    fat emulsion 20 % with fish oil      IP okay to treat      ketamine 0.25 mg/kg/hr (10/15/22 1404)    Parenteral Nutrition (CENTRAL) 45 mL/hr at 10/14/22 2303    sodium chloride      sodium chloride 0.45 % 100 mL/hr (10/15/22 1807)     PRN Meds:.acetaminophen, calcium carbonate, fat emulsion 20 %, fat emulsion 20 %, guaiFENesin, IP okay to treat, LORazepam, ondansetron, oxyCODONE, phenol, [Provider Hold] polyethylene glycol, promethazine, zinc oxide-cod liver oil      Objective:   Vital signs:   Temp:  [36.4 ??C (97.6 ??F)-37.1 ??C (98.8 ??F)] 36.7 ??C (98.1 ??F)  Heart Rate:  [77-148] 148  SpO2 Pulse:  [76-146] 146  Resp:  [10-23] 22  BP: (105-130)/(51-73) 113/53  MAP (mmHg):  [67-87] 74  SpO2:  [97 %-100 %] 97 %    Physical Exam:  Gen: No acute distress.    Mental Status Exam:  Appearance:  appears stated age and has NG tube in   Attitude:   calm, cooperative   Behavior/Psychomotor:  appropriate eye contact and no abnormal movements   Speech/Language:   reduced rate, not pressured, reduced volume, normal fluency. normal articulation   Mood:  ???discouraged???   Affect:  mood congruent, euthymic, and sedated   Thought process:  logical, linear, clear, coherent, goal directed   Thought content:    denies thoughts of self-harm. Denies SI, plans, or intent. Denies HI.  No grandiose, self-referential, persecutory, or paranoid delusions noted.   Perceptual disturbances:   denies auditory and visual hallucinations   Attention:  able to attend to interview without fluctuations in consciousness Completes MOYB w/o error   Concentration:  Able to fully concentrate and attend   Orientation:  grossly oriented.   Memory:  not formally tested, but grossly intact   Fund of knowledge:   not formally assessed   Insight:    Fair   Judgment:   Fair   Impulse Control:  Fair     Relevant laboratory/imaging data was reviewed.    Additional Psychometric Testing:  Not applicable.    Consult Type and Time-Based Documentation:  This patient was evaluated in person.    Time-based billing disclaimer:

## 2022-10-16 NOTE — Unmapped (Signed)
Pt AOx4, afebrile, VSS this shift. Oxygen >90% on 2LNC. Increased abd pain 1x dose of oxy ordered and given. PRN IV phenergan given as well. Adequate UOP, Small BM x2 this shift- pt refused PM senna and suppository. Falls and safety precautions in place. No additional adverse events.    Problem: Adult Inpatient Plan of Care  Goal: Plan of Care Review  Outcome: Ongoing - Unchanged  Goal: Patient-Specific Goal (Individualized)  Outcome: Ongoing - Unchanged  Goal: Absence of Hospital-Acquired Illness or Injury  Outcome: Ongoing - Unchanged  Intervention: Identify and Manage Fall Risk  Recent Flowsheet Documentation  Taken 10/15/2022 2000 by Nanetta Batty, RN  Safety Interventions: aspiration precautions  Intervention: Prevent and Manage VTE (Venous Thromboembolism) Risk  Recent Flowsheet Documentation  Taken 10/15/2022 2100 by Nanetta Batty, RN  VTE Prevention/Management: bleeding precautions maintained  Anti-Embolism Device Type: SCD, Knee  Anti-Embolism Intervention: On  Anti-Embolism Device Location: BLE  Goal: Optimal Comfort and Wellbeing  Outcome: Ongoing - Unchanged  Goal: Readiness for Transition of Care  Outcome: Ongoing - Unchanged  Goal: Rounds/Family Conference  Outcome: Ongoing - Unchanged

## 2022-10-17 LAB — COMPREHENSIVE METABOLIC PANEL
ALBUMIN: 3.4 g/dL (ref 3.4–5.0)
ALBUMIN: 3.3 g/dL — ABNORMAL LOW (ref 3.4–5.0)
ALKALINE PHOSPHATASE: 81 U/L (ref 46–116)
ALKALINE PHOSPHATASE: 84 U/L (ref 46–116)
ALT (SGPT): 132 U/L — ABNORMAL HIGH (ref 10–49)
ALT (SGPT): 128 U/L — ABNORMAL HIGH (ref 10–49)
ANION GAP: 6 mmol/L (ref 5–14)
ANION GAP: 6 mmol/L (ref 5–14)
AST (SGOT): 39 U/L — ABNORMAL HIGH (ref <=34)
BILIRUBIN TOTAL: 0.2 mg/dL — ABNORMAL LOW (ref 0.3–1.2)
BILIRUBIN TOTAL: 0.3 mg/dL (ref 0.3–1.2)
BLOOD UREA NITROGEN: 10 mg/dL (ref 9–23)
BLOOD UREA NITROGEN: 12 mg/dL (ref 9–23)
BUN / CREAT RATIO: 42
BUN / CREAT RATIO: 32
CALCIUM: 8.7 mg/dL (ref 8.7–10.4)
CALCIUM: 9.1 mg/dL (ref 8.7–10.4)
CHLORIDE: 103 mmol/L (ref 98–107)
CHLORIDE: 108 mmol/L — ABNORMAL HIGH (ref 98–107)
CO2: 25 mmol/L (ref 20.0–31.0)
CO2: 27 mmol/L (ref 20.0–31.0)
CREATININE: 0.24 mg/dL — ABNORMAL LOW
CREATININE: 0.38 mg/dL — ABNORMAL LOW
EGFR CKD-EPI (2021) FEMALE: >90 mL/min/{1.73_m2} (ref >=60)
EGFR CKD-EPI (2021) FEMALE: >90 mL/min/{1.73_m2} (ref >=60)
GLUCOSE RANDOM: 597 mg/dL (ref 70–179)
GLUCOSE RANDOM: 186 mg/dL — ABNORMAL HIGH (ref 70–179)
POTASSIUM: 7.3 mmol/L (ref 3.4–4.8)
POTASSIUM: 4 mmol/L (ref 3.4–4.8)
PROTEIN TOTAL: 5.9 g/dL (ref 5.7–8.2)
PROTEIN TOTAL: 6.4 g/dL (ref 5.7–8.2)
SODIUM: 134 mmol/L — ABNORMAL LOW (ref 135–145)
SODIUM: 141 mmol/L (ref 135–145)

## 2022-10-17 LAB — MAGNESIUM: MAGNESIUM: 1.9 mg/dL (ref 1.6–2.6)

## 2022-10-17 LAB — PHOSPHORUS: PHOSPHORUS: 2 mg/dL — ABNORMAL LOW (ref 2.4–5.1)

## 2022-10-17 MED ADMIN — buprenorphine-naloxone (SUBOXONE) 2-0.5 mg SL film 0.5 mg of buprenorphine: .25 | SUBLINGUAL | @ 13:00:00

## 2022-10-17 MED ADMIN — ondansetron (ZOFRAN) injection 4 mg: 4 mg | INTRAVENOUS | @ 21:00:00

## 2022-10-17 MED ADMIN — diphenhydrAMINE (BENADRYL) injection: 50 mg | INTRAVENOUS | @ 16:00:00 | Stop: 2022-10-17

## 2022-10-17 MED ADMIN — pantoprazole (Protonix) injection 40 mg: 40 mg | INTRAVENOUS | @ 13:00:00

## 2022-10-17 MED ADMIN — promethazine (PHENERGAN) 6.5 mg in sodium chloride (NS) 0.9 % 50 mL IVPB: 6.5 mg | INTRAVENOUS | @ 01:00:00

## 2022-10-17 MED ADMIN — cetirizine (ZYRTEC) tablet 20 mg: 20 mg | GASTROENTERAL | @ 13:00:00

## 2022-10-17 MED ADMIN — cetirizine (ZYRTEC) tablet 20 mg: 20 mg | GASTROENTERAL

## 2022-10-17 MED ADMIN — diphenhydrAMINE (BENADRYL) injection: 25 mg | INTRAVENOUS | @ 18:00:00 | Stop: 2022-10-17

## 2022-10-17 MED ADMIN — ondansetron (ZOFRAN) injection 4 mg: 4 mg | INTRAVENOUS | @ 14:00:00

## 2022-10-17 MED ADMIN — baclofen (LIORESAL) tablet 10 mg: 10 mg | ORAL | @ 19:00:00

## 2022-10-17 MED ADMIN — buprenorphine-naloxone (SUBOXONE) 2-0.5 mg SL film 0.5 mg of buprenorphine: .25 | SUBLINGUAL | @ 03:00:00

## 2022-10-17 MED ADMIN — naloxegol (MOVANTIK) tablet 25 mg: 25 mg | ORAL | @ 13:00:00

## 2022-10-17 MED ADMIN — mirtazapine (REMERON) tablet 15 mg: 15 mg | GASTROENTERAL | @ 03:00:00

## 2022-10-17 MED ADMIN — potassium phosphate 15 mmol in sodium chloride (NS) 0.9 % 250 mL infusion: 15 mmol | INTRAVENOUS | @ 23:00:00 | Stop: 2022-10-17

## 2022-10-17 MED ADMIN — Parenteral Nutrition (CENTRAL): INTRAVENOUS | @ 03:00:00 | Stop: 2022-10-17

## 2022-10-17 MED ADMIN — LORazepam (ATIVAN) tablet 0.5 mg: .5 mg | ORAL | @ 10:00:00

## 2022-10-17 MED ADMIN — fat emulsion 20 % with fish oil (SMOFlipid) infusion 250 mL: 250 mL | INTRAVENOUS | @ 03:00:00 | Stop: 2022-10-17

## 2022-10-17 MED ADMIN — LORazepam (ATIVAN) tablet 0.5 mg: .5 mg | ORAL | @ 01:00:00

## 2022-10-17 MED ADMIN — pantoprazole (Protonix) injection 40 mg: 40 mg | INTRAVENOUS | @ 01:00:00

## 2022-10-17 MED ADMIN — buprenorphine-naloxone (SUBOXONE) 2-0.5 mg SL film 0.5 mg of buprenorphine: .25 | SUBLINGUAL | @ 19:00:00

## 2022-10-17 MED ADMIN — predniSONE (DELTASONE) tablet 50 mg: 50 mg | ORAL | Stop: 2022-10-16

## 2022-10-17 MED ADMIN — ferumoxytol (FERAHEME) 1,020 mg in sodium chloride (NS) 0.9 % 100 mL IVPB: 1020 mg | INTRAVENOUS | @ 16:00:00 | Stop: 2022-10-17

## 2022-10-17 MED ADMIN — baclofen (LIORESAL) tablet 10 mg: 10 mg | ORAL | @ 03:00:00

## 2022-10-17 MED ADMIN — lactated Ringers infusion: 50 mL/h | INTRAVENOUS | @ 14:00:00 | Stop: 2022-10-17

## 2022-10-17 MED ADMIN — baclofen (LIORESAL) tablet 10 mg: 10 mg | ORAL | @ 13:00:00

## 2022-10-17 MED ADMIN — diphenhydrAMINE (BENADRYL) 50 mg/mL injection: INTRAVENOUS | @ 18:00:00 | Stop: 2022-10-17

## 2022-10-17 MED ADMIN — oxyCODONE (ROXICODONE) immediate release tablet 10 mg: 10 mg | ORAL | @ 20:00:00 | Stop: 2022-10-31

## 2022-10-17 MED ADMIN — famotidine (PF) (PEPCID) injection 20 mg: 20 mg | INTRAVENOUS | @ 01:00:00

## 2022-10-17 MED ADMIN — senna (SENOKOT) tablet 1 tablet: 1 | ORAL | @ 01:00:00

## 2022-10-17 MED ADMIN — promethazine (PHENERGAN) 6.5 mg in sodium chloride (NS) 0.9 % 50 mL IVPB: 6.5 mg | INTRAVENOUS | @ 09:00:00

## 2022-10-17 MED ADMIN — predniSONE (DELTASONE) tablet 50 mg: 50 mg | ORAL | @ 07:00:00 | Stop: 2022-10-17

## 2022-10-17 MED ADMIN — methylPREDNISolone sodium succinate (PF) (SOLU-Medrol) injection 40 mg: 40 mg | INTRAVENOUS | @ 15:00:00 | Stop: 2022-10-17

## 2022-10-17 MED ADMIN — nirogacestat (OGSIVEO) tablet 50 mg *Patient Supplied*: 50 mg | ORAL | @ 13:00:00

## 2022-10-17 MED ADMIN — famotidine (PF) (PEPCID) injection 40 mg: 40 mg | INTRAVENOUS | @ 16:00:00 | Stop: 2022-10-17

## 2022-10-17 MED ADMIN — senna (SENOKOT) tablet 1 tablet: 1 | ORAL | @ 13:00:00

## 2022-10-17 MED ADMIN — diphenhydrAMINE (BENADRYL) injection: 25 mg | INTRAVENOUS | @ 20:00:00 | Stop: 2022-10-17

## 2022-10-17 MED ADMIN — promethazine (PHENERGAN) 6.5 mg in sodium chloride (NS) 0.9 % 50 mL IVPB: 6.5 mg | INTRAVENOUS | @ 19:00:00

## 2022-10-17 NOTE — Unmapped (Signed)
Pt FC, OOB to BR with 1-2 person assist. Prn ativan given x2. VSS. On [place] 2L. NGT in place; phenergan given for nausea. Voids. TPN and lipids per order.       Problem: Adult Inpatient Plan of Care  Goal: Plan of Care Review  Outcome: Ongoing - Unchanged  Goal: Patient-Specific Goal (Individualized)  Outcome: Ongoing - Unchanged  Goal: Absence of Hospital-Acquired Illness or Injury  Outcome: Ongoing - Unchanged  Intervention: Identify and Manage Fall Risk  Recent Flowsheet Documentation  Taken 10/16/2022 2000 by Estella Husk, RN  Safety Interventions:   lighting adjusted for tasks/safety   low bed   fall reduction program maintained   family at bedside  Intervention: Prevent Skin Injury  Recent Flowsheet Documentation  Taken 10/17/2022 0000 by Estella Husk, RN  Positioning for Skin: Supine/Back  Taken 10/16/2022 2200 by Estella Husk, RN  Positioning for Skin: Supine/Back  Device Skin Pressure Protection: absorbent pad utilized/changed  Taken 10/16/2022 2000 by Estella Husk, RN  Positioning for Skin: Supine/Back  Device Skin Pressure Protection: absorbent pad utilized/changed  Intervention: Prevent and Manage VTE (Venous Thromboembolism) Risk  Recent Flowsheet Documentation  Taken 10/17/2022 0600 by Estella Husk, RN  Anti-Embolism Intervention: Refused  Taken 10/17/2022 0400 by Estella Husk, RN  Anti-Embolism Intervention: Refused  Taken 10/17/2022 0200 by Estella Husk, RN  Anti-Embolism Intervention: Refused  Taken 10/17/2022 0000 by Estella Husk, RN  Anti-Embolism Intervention: Refused  Taken 10/16/2022 2200 by Estella Husk, RN  Anti-Embolism Intervention: Refused  Taken 10/16/2022 2000 by Estella Husk, RN  Anti-Embolism Intervention: Refused  Intervention: Prevent Infection  Recent Flowsheet Documentation  Taken 10/16/2022 2000 by Estella Husk, RN  Infection Prevention: rest/sleep promoted  Goal: Optimal Comfort and Wellbeing  Outcome: Ongoing - Unchanged  Goal: Readiness for Transition of Care  Outcome: Ongoing - Unchanged  Goal: Rounds/Family Conference  Outcome: Ongoing - Unchanged     Problem: Latex Allergy  Goal: Absence of Allergy Symptoms  Outcome: Ongoing - Unchanged     Problem: Fall Injury Risk  Goal: Absence of Fall and Fall-Related Injury  Outcome: Ongoing - Unchanged  Intervention: Promote Injury-Free Environment  Recent Flowsheet Documentation  Taken 10/16/2022 2000 by Estella Husk, RN  Safety Interventions:   lighting adjusted for tasks/safety   low bed   fall reduction program maintained   family at bedside     Problem: Skin Injury Risk Increased  Goal: Skin Health and Integrity  Outcome: Ongoing - Unchanged  Intervention: Optimize Skin Protection  Recent Flowsheet Documentation  Taken 10/17/2022 0430 by Estella Husk, RN  Activity Management: ambulated to bathroom  Taken 10/17/2022 0000 by Estella Husk, RN  Pressure Reduction Techniques: frequent weight shift encouraged  Head of Bed (HOB) Positioning: HOB at 30-45 degrees  Pressure Reduction Devices: pressure-redistributing mattress utilized  Taken 10/16/2022 2200 by Estella Husk, RN  Pressure Reduction Techniques: frequent weight shift encouraged  Head of Bed (HOB) Positioning: HOB at 30-45 degrees  Pressure Reduction Devices: pressure-redistributing mattress utilized  Taken 10/16/2022 2115 by Estella Husk, RN  Activity Management: ambulated to bathroom  Taken 10/16/2022 2000 by Estella Husk, RN  Pressure Reduction Techniques: frequent weight shift encouraged  Head of Bed (HOB) Positioning: HOB at 30-45 degrees  Pressure Reduction Devices: pressure-redistributing mattress utilized     Problem: Self-Care Deficit  Goal: Improved Ability to Complete Activities of Daily Living  Outcome: Ongoing - Unchanged  Problem: Comorbidity Management  Goal: Blood Glucose Levels Within Targeted Range  Outcome: Ongoing - Unchanged  Goal: Blood Pressure in Desired Range  Outcome: Ongoing - Unchanged

## 2022-10-17 NOTE — Unmapped (Addendum)
Hospital Medicine Daily Progress Note    Assessment/Plan:    Principal Problem:    Other acute postprocedural pain  Active Problems:    [name]    Intestinal polyps    [name]    Neoplasm related pain    Physical deconditioning    History of colectomy    Intractable abdominal pain    Generalized anxiety disorder with panic attacks    SBO (small bowel obstruction) (CMS-HCC)    Small intestinal bacterial overgrowth (SIBO)  Resolved Problems:    * No resolved hospital problems. 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    Ileus, multifactorial but with component of narcotic bowel, resolved  Inability to tolerate PO, without diagnosis of malnutrition  Chronic abdominal pain 2/2 desmoid tumors and prior surgeries  Ileus at this point seems resolved, though patient still struggling with some ongoing nausea. Having small consistent bowel movements with very rare need for intermittent suction. Nausea is improved but certainly not resolved, and she is working on increasing the volume and textures of solid intake.   - Appreciate Chronic Pain recommendations on reducing opioid burden  - SLP following, regular diet recommended. Continue to encourage pills taken orally.   - Advance to regular diet as desired  - Continue to encourage swallowing pills. Speech to reassess next week. Encouraged crushing meds and taking with purees as needed (though not all pills can be crushed).   - NG tube clamped; avoid LIS unless having severe/intractable nausea/vomiting   - Patient needs to be able to swallow PO meds to have NG tube removed  - continue TPN for nutritional support, reorder daily  - continue bowel regimen with BID suppository, reduce senna to 1 pill  - STOPPING ketamine infusion (8/1 - 8/8).   - Will continue to encourage minimal opioid use but ok per discussion with chronic pain to resume periodic oxycodone BID PRN (ordered).  Resumed naloxegol to prevent any worsening of opioid-induced constipation/ileus.    Syncope and pre-syncope, uncertain etiology:  Possibly vasovagal vs adverse effect of medications vs deconditioning vs iron deficiency. Has improved with weaning opioids but still occurring, last incident 8/6 (LOC per nurse, witnessed).  Previously worked up for PE, with negative CTA.  TTE normal. Cosyntropin stim test and thyroid studies normal. EKG remains sinus.   - adjust benzos, other potentially vasoactive meds as tolerated   - IV iron infusion today (see below)  - consider starting low dose propranolol tomorrow    Iron deficiency anemia  Ferritin 5, iron saturation 6%. Has had prior reactions with numerous attempts to give IV iron.   - Pharmacy obtained approval for Feraheme 1020 mg  - Pre-med protocol per A&I. Recommended one week pre-meds but since inpatient, reasonable to trial after minimum of 48 hours. Patient prefers to proceed with iron as soon as possible, though I did offer to delay 3-4 days to allow for further pre-meds. Started antihistamine 8/7. Plan for steroids to start tonight and IV iron tomorrow morning.   1) Antihistamines:   - Continue Cetirizine 20  mg BID  - on the day of infusion, cetirizine 20 qAM, then IV benadryl 50mg  and famotidine 40 mg IV 30 minutes prior to infusion, then cetirizine 20 mg qPM   - Continue cetirizine 20 mg BID for 72 hours after infusion  2) IVF hydration 30 minutes prior to infusion  3) Steroids: pretreatment with 50mg  prednisone 13 hours prior to the infusion, 50mg  prednisone 7 hours prior to the infusion, then the solumedrol dose 40mg  (IV dose) prior to the infusion.   - THUS FAR, has had mild reaction with itching and patchy rash. Improved with subsequent dose of IV benadryl and slowing the infusion down.  Tryptase level ordered.    Transaminitis  Patient with acute rise in AST and ALT on 8/8, normal bilirubin and alk phos, suggestive of hepatocellular injury. Improving today without intervention. Discussed with pharmacy re: TPN. Given improvement, will proceed with TPN as previous.   - Monitor for further improvement    Headache, improved  Minimal improvement with IV fluids, magnesium, IV Benadryl. Concerning for rebound/medication overuse headache. Headache has improved with Tylenol holiday and ketamine though not completely resolved.  - Continue IV Tylenol holiday (last dose 7/29) but will resume enteral PRN tylenol  - Consider adding Fioricet daily prn if no improvement with Tylenol holiday  - Avoid NSAIDs, avoid IV Benadryl  - consider propranolol use as above    Muscle spasms and diffuse pain, uncertain etiology  Occurred post-procedurally, with reassuring evaluation (MRI spine, neuro c/s, CK normal).  Repeat CK on 8/6 was also normal.  - Appreciate Chronic Pain recommendations  - Continue baclofen 10 mg TID (increased on 8/8). Can consider further increase in a few days if needed.  - continue ativan TID prn (PTA med), but caution against escalating (noting risks of concurrent benzos + opiates, possibility of contribution to syncope and other symptoms)  - Consulted PM&R, appreciate assistance.     Desmoid tumors - [name]  - Restarting nirogacestat at lower dose of 50 mg BID on 8/8 after discussion with primary oncologist Dr. Yates Decamp cough  Patient with some coarse breath sounds bilaterally. CXR 8/5 clear.  - Add PRN robitussin  - Encourage incentive spirometry    Chronic/stable/resolved problems:  SIBO: hold home cefdinir, doxy    FEN: Encourage regular diet, TPN.   VTE ppx: ambulation  Code status: Full code  Dispo: home with HH PT/OT/TPN vs SNF pending resolution of ileus and successful removal of NG tube    I personally spent 50 minutes face-to-face and non-face-to-face in the care of this patient, which includes all pre, intra, and post visit time on the date of service.  All documented time was specific to the E/M visit and does not include any procedures that may have been performed.  ___________________________________________________________________    Subjective:  [name] [name] reports that she had a rough night as she has been.  She felt like yesterday was one of the toughest days she has had as she is felt really weak and tired.  She was able to try several different foods but did not eat a lot of any of them as she feels like her taste buds are still adjusting to eating again.  Her mother noted some sweats overnight and a temp of 99 but never spiked a fever.  She is hoping to talk again with speech therapy given still having some trouble with pills.  She is looking forward to seeing if she responds well to the  iron infusion.    This afternoon, she was noted to have itching and redness on her skin after starting the iron infusion.  The infusion was slowed and she was given another dose of IV Benadryl 25 mg.  She had some quick relief after this and continued with the infusion.  Mid afternoon the infusion had finished and patient was having worsening itching so another dose of 25 mg was administered.    Labs/Studies:  Labs per EMR and Reviewed (last 24hrs)    Objective:  BP 115/64  - Pulse 91  - Temp 36.8 ??C (98.2 ??F) (Axillary)  - Resp 16  - Ht 165.1 cm (5' 5)  - Wt 60.2 kg (132 lb 11.5 oz)  - SpO2 98%  - BMI 22.09 kg/m??     GEN: No acute distress  HEENT: NG tube in place.  CV: RRR, nl S1 S2 no m/r/g  ABD: Soft, mildly distended but stable, bowel sounds present in all 4 quadrants  EXT: WWP, no edema.   NEURO: No focal deficits, lower extremities with slightly increased tone  PSYCH: appropriate affect

## 2022-10-17 NOTE — Unmapped (Signed)
Department of Anesthesiology  Pain Medicine Division    Chronic Pain Followup Inpatient Consult Note    Requesting Attending Physician:  Kateri Plummer, MD  Service Requesting Consult:  Med [facility])    Assessment/Recommendations:  The patient was seen in consultation on request of Rocco Serene, MD regarding assistance with pain management for patient's chronic intractable abdominal pain with now acute on chronic recurrent ileus/NG tube placement.  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      Patient currently established with [facility] pain management center Dr. Dyann Ruddle, has been on oxycodone 10 mg 3 times daily, Suboxone 0.5 mg 3 times daily along with baclofen and pregabalin. Overall goal is to wean to buprenorphine and non-opioid medications and eventually off of buprenorphine as well. Through her psychiatrist she is on Ativan 0.5 mg 3 times daily as needed. She has contracted to avoid taking the Ativan at the same time or close to when she takes her opioid pain medications.    Patient has had multiple prior inpatient stays either regarding acute exacerbation of chronic pain or ileus.  In the past 12 months she has had 2 infusions of ketamine with limited benefit (last infusion in May 2024) and at one point there was some transaminitis which was thought to be attributed from other etiologies like TPN versus other intra-abdominal pathologies.  In the past patient also has had lidocaine infusions during inpatient stay with limited/variable benefit.        Regarding ileus and her constipation, After discussion with the patient and mother we came to an agreement to attempt with a low-dose of naloxegol 12.5 mg, she has taken Alvimopan around surgery time before and seemed to tolerate it. GI team recommended against neostigmine at this time. We expect the naloxegol to increase her abdominal pain at first but hope that we can continue it and even increase to 25mg  if able.     This is no doubt a very complex situation. Her biggest issue at this point seems to be the ileus. Medication management should focus on non-opioid medications, although she has trialed others in past with side effects or minimal benefit.  Hopefully minimizing opioids and working on her ileus will help get her out of the hospital quicker than previously.  Our goal while admitted would be for her to be able to manage her pain without the need for IV opioids and instead take oxycodone up to 15 mg 4 times daily as needed, with the goal of trying to get as close to oxycodone 10 mg 3 times daily as needed as possible.    Her Suboxone is currently increased from her home dose in an attempt to provide optimal basal control of her pain, favoring an increase in buprenorphine which is a partial mu opioid agonist over use of full mu opioid agonists, which can have worse GI side effect profiles.  Again, ultimately the goal would be to wean off of oxy and manage her pain with buprenorphine alone.     Has had lidocaine gtt this admission with most help. Has been avoiding IV HM with success. Has been having HA's c/w tension type headaches with likely rebound/medication over use component.     Interval Events: Patient evaluated at bedside this morning. Tearful discussing past 24 hrs in hospital and overall feelings that she is not/will not get better. Needed oxycodone  10 mg yesterday afternoon for pain, disappointed in self for asking for it. Tried to reinforce that while getting her off opioids completing is the goal, we acknowledge that she has real pain and limited treatment options and okay to take pain medication when she needs it. Ketamine infusion to stop today.     Vital Sign Trends: Vitally stable, afebrile.    Hospital Analgesic Medications:  Baclofen 10 mg TID  Suboxone 1-0.5 mg SL TID  Ketamine 0.25 mg/kg/hr 8/1-9  APAP 650 mg q6 PRN     Recommendations:  -The chronic pain service is a consult service and does not place orders, just makes recommendations (except ketamine and lidocaine infusions)   -Please evaluate all patients on opioids for appropriateness of prescribing narcan at discharge.  The chronic pain service can assist with this.  Nasal narcan is covered by most insurances.  -Recommendations given apply to the current hospitalization and do not reflect long term recommendations.    - Ketamine infusion discontinued 8/9  - As ketamine comes off, okay to treat pain flares with as needed oxycodone. Discussed with hospitalist, Dr. Nilda Riggs, will add oxycodone 10 mg BID PRN. Would like to focus on treating acute pain, getting her medically stable, and have her follow up with [name] for any further titration of chronic pain medication.   - Continue IV APAP holiday for concern for rebound headache but may have PRN g-tube doses   - Pt will continue to try to avoid PRN Ativan use  - If headaches are not improved from IV Tylenol holiday, consider Fioricet once daily as needed for headaches  - Continue Baclofen 10 mg TID  - Agree with primary team pain and bowel regimen   - Recommend continued physical therapy to work on bed level mobility   - Recommend involvement of inpatient psychology and complex care given patient's distress surrounding hospitalization    We will continue to follow.    Naloxone Rx at discharge?  Is patient on opioids? Yes.  1)Is dose >50MME?  Yes.  2) Is patient prescribed a benzodiazepine (w opioids)? Yes.  3)Hx of overdose?  No.  4) Hx of substance use disorder? No.  5) Opioids likely to last greater than a week after discharge? Yes.     If yes to 2 or more, prescribe naloxone at discharge.  Nasal narcan for most insured (Nasal narcan 4mg /actuation, prescribe 1 kit, instructions at [email or web address]).  For uninsured, chronic pain can work to assist in finding an option.  OTC nasal narcan now available at most pharmacies for around $45.    Interim History  There were no Acute Events Overnight.  The patient is not obtaining adequate pain relief on current medication regimen and feels that their pain is Somewhat controlled    Analgesia Evaluation:  Pain at minimum: 3/10  Pain at maximum: 9/10    Inpatient Medications  Current Facility-Administered Medications   Medication Dose Route Frequency Provider Last Rate Last Admin    acetaminophen (TYLENOL) tablet 650 mg  650 mg Enteral tube: gastric Q6H PRN Kateri Plummer, MD   650 mg at 10/16/22 1315    baclofen (LIORESAL) tablet 10 mg  10 mg Oral TID Kateri Plummer, MD   10 mg at 10/17/22 0924    bisacodyl (DULCOLAX) suppository 10 mg  10 mg Rectal BID Lambert Mody, MD PhD   10 mg at 10/14/22 2055    buprenorphine-naloxone (SUBOXONE) 2-0.5 mg SL film 0.5 mg of buprenorphine  0.25  Film Sublingual TID Lambert Mody, MD PhD   0.5 mg of buprenorphine at 10/17/22 1610    calcium carbonate (TUMS) chewable tablet 400 mg elem calcium  400 mg elem calcium Oral Daily PRN Terri Piedra, AGNP        [Provider Hold] cefdinir (OMNICEF) capsule 300 mg  300 mg Oral Q12H [facility] Rocco Serene, MD        cetirizine (ZYRTEC) tablet 20 mg  20 mg Enteral tube: gastric BID Kateri Plummer, MD   20 mg at 10/17/22 9604    CHEMO CLARIFICATION ORDER   Other Continuous PRN Lestine Box, PharmD        diphenhydrAMINE (BENADRYL) injection  50 mg Intravenous Once Kateri Plummer, MD        [Provider Hold] doxycycline (VIBRA-TABS) tablet 100 mg  100 mg Oral BID Rocco Serene, MD        famotidine (PF) (PEPCID) injection 20 mg  20 mg Intravenous At bedtime Rocco Serene, MD   20 mg at 10/16/22 2031    famotidine (PF) (PEPCID) injection 40 mg  40 mg Intravenous Once Kateri Plummer, MD        fat emulsion 20 % (INTRAlipid) infusion 100 mL  100 mL Intravenous Once PRN Lobonc, Ammie Ferrier, MD        fat emulsion 20 % (INTRAlipid) infusion 200 mL  200 mL Intravenous Once PRN Lobonc, Ammie Ferrier, MD        ferumoxytol Duncan Dull) 1,020 mg in sodium chloride (NS) 0.9 % 100 mL IVPB  1,020 mg Intravenous Once Kateri Plummer, MD        guaiFENesin (ROBITUSSIN) oral syrup  200 mg Oral Q4H PRN Kateri Plummer, MD        IP OKAY TO TREAT   Other Continuous PRN Sheral Apley, MD        lactated Ringers infusion  50 mL/hr Intravenous Continuous Kateri Plummer, MD 50 mL/hr at 10/17/22 1026 50 mL/hr at 10/17/22 1026    LORazepam (ATIVAN) tablet 0.5 mg  0.5 mg Oral TID PRN Lambert Mody, MD PhD   0.5 mg at 10/17/22 0554    methylPREDNISolone sodium succinate (PF) (SOLU-Medrol) injection 40 mg  40 mg Intravenous Once Kateri Plummer, MD        mirtazapine (REMERON) tablet 15 mg  15 mg Enteral tube: gastric Nightly Kateri Plummer, MD   15 mg at 10/16/22 2300    naloxegol (MOVANTIK) tablet 25 mg  25 mg Oral Daily Kateri Plummer, MD   25 mg at 10/17/22 5409    nirogacestat (OGSIVEO) tablet 50 mg *Patient Supplied*  50 mg Oral BID Sheral Apley, MD   50 mg at 10/17/22 0927    ondansetron (ZOFRAN) injection 4 mg  4 mg Intravenous Q6H PRN Terri Piedra, AGNP   4 mg at 10/17/22 0940    oxyCODONE (ROXICODONE) immediate release tablet 10 mg  10 mg Oral BID PRN Kateri Plummer, MD        pantoprazole (Protonix) injection 40 mg  40 mg Intravenous BID Rocco Serene, MD   40 mg at 10/17/22 8119    Parenteral Nutrition (CENTRAL)   Intravenous Continuous Kateri Plummer, MD 45 mL/hr at 10/17/22 0400 Rate Verify at 10/17/22 0400    phenol (CHLORASEPTIC) 1.4 % spray 2 spray  2 spray Mucous Membrane Q2H PRN Rocco Serene, MD   2 spray at 10/03/22 [phone]    [  Provider Hold] polyethylene glycol (MIRALAX) packet 17 g  17 g Oral Daily PRN Terri Piedra, AGNP        promethazine (PHENERGAN) 6.5 mg in sodium chloride (NS) 0.9 % 50 mL IVPB  6.5 mg Intravenous Q6H PRN Suzzanne Cloud, MD   Stopped at 10/17/22 0554    senna (SENOKOT) tablet 1 tablet  1 tablet Oral BID Kateri Plummer, MD   1 tablet at 10/17/22 1610    sodium chloride (NS) 0.9 % infusion  10 mL/hr Intravenous Continuous Nat Christen., MD        sodium chloride 0.45% (1/2 NS) infusion  100 mL/hr Intravenous Continuous [name], MD 100 mL/hr at 10/17/22 0400 100 mL/hr at 10/17/22 0400    zinc oxide-cod liver oil (DESITIN 40%) Paste   Topical Daily PRN Arman Filter, MD   Given at 10/13/22 1015     Objective:     Vital Signs    Temp:  [36.8 ??C (98.2 ??F)-37.3 ??C (99.1 ??F)] 36.9 ??C (98.4 ??F)  Heart Rate:  [80-121] 95  SpO2 Pulse:  [80-114] 96  Resp:  [7-25] 15  BP: (104-130)/(53-72) 124/57  MAP (mmHg):  [68-89] 75  SpO2:  [97 %-100 %] 99 %    Physical Exam     GENERAL:  Well developed, well-nourished female in no distress.  HEAD/NECK:    Normocephalic/atraumatic. NGT present.  CARDIOVASCULAR:   WWP, regular tate  LUNGS:   Normal work of breathing, [place]  ABDOMEN  Soft, non-distended.  EXTREMITIES:  Warm and well-perfused.  NEUROLOGIC:    The patient was alert and oriented times four with normal language, attention, cognition and memory. Cranial nerve exam was grossly normal.    MUSCULOSKELETAL:    SKIN:  No obvious rashes lesions or erythema  PSY:  Appropriate affect and mood.    Test Results    Lab Results   Component Value Date    CREATININE 0.38 (L) 10/17/2022     Lab Results   Component Value Date    ALKPHOS 84 10/17/2022    BILITOT 0.3 10/17/2022    BILIDIR <0.10 10/11/2022    PROT 6.4 10/17/2022    ALBUMIN 3.3 (L) 10/17/2022    ALT 128 (H) 10/17/2022    AST 39 (H) 10/17/2022     Problem List    Principal Problem:    Other acute postprocedural pain  Active Problems:    [name]    Intestinal polyps    [name]    Neoplasm related pain    Physical deconditioning    History of colectomy    Intractable abdominal pain    Generalized anxiety disorder with panic attacks    SBO (small bowel obstruction) (CMS-HCC)    Small intestinal bacterial overgrowth (SIBO)    Rebeca Alert, DO  Addiction Medicine Fellow  10/17/22

## 2022-10-18 LAB — COMPREHENSIVE METABOLIC PANEL
ALBUMIN: 3.2 g/dL — ABNORMAL LOW (ref 3.4–5.0)
ALKALINE PHOSPHATASE: 70 U/L (ref 46–116)
ALT (SGPT): 79 U/L — ABNORMAL HIGH (ref 10–49)
ANION GAP: 5 mmol/L (ref 5–14)
AST (SGOT): 18 U/L (ref <=34)
BILIRUBIN TOTAL: <0.2 mg/dL — ABNORMAL LOW (ref 0.3–1.2)
BLOOD UREA NITROGEN: 10 mg/dL (ref 9–23)
BUN / CREAT RATIO: 23
CALCIUM: 9 mg/dL (ref 8.7–10.4)
CHLORIDE: 111 mmol/L — ABNORMAL HIGH (ref 98–107)
CO2: 27 mmol/L (ref 20.0–31.0)
CREATININE: 0.43 mg/dL — ABNORMAL LOW
EGFR CKD-EPI (2021) FEMALE: >90 mL/min/{1.73_m2} (ref >=60)
GLUCOSE RANDOM: 129 mg/dL (ref 70–179)
POTASSIUM: 3.9 mmol/L (ref 3.4–4.8)
PROTEIN TOTAL: 5.8 g/dL (ref 5.7–8.2)
SODIUM: 143 mmol/L (ref 135–145)

## 2022-10-18 LAB — CBC
HEMATOCRIT: 25.8 % — ABNORMAL LOW (ref 34.0–44.0)
HEMOGLOBIN: 8.5 g/dL — ABNORMAL LOW (ref 11.3–14.9)
MEAN CORPUSCULAR HEMOGLOBIN CONC: 33.1 g/dL (ref 32.0–36.0)
MEAN CORPUSCULAR HEMOGLOBIN: 26.4 pg (ref 25.9–32.4)
MEAN CORPUSCULAR VOLUME: 79.8 fL (ref 77.6–95.7)
MEAN PLATELET VOLUME: 11 fL — ABNORMAL HIGH (ref 6.8–10.7)
PLATELET COUNT: 210 10*9/L (ref 150–450)
RED BLOOD CELL COUNT: 3.23 10*12/L — ABNORMAL LOW (ref 3.95–5.13)
RED CELL DISTRIBUTION WIDTH: 13.6 % (ref 12.2–15.2)
WBC ADJUSTED: 10.1 10*9/L (ref 3.6–11.2)

## 2022-10-18 MED ADMIN — diphenhydrAMINE (BENADRYL) injection: 25 mg | INTRAVENOUS | @ 16:00:00 | Stop: 2022-10-18

## 2022-10-18 MED ADMIN — ondansetron (ZOFRAN) injection 4 mg: 4 mg | INTRAVENOUS | @ 19:00:00

## 2022-10-18 MED ADMIN — sodium chloride 0.45% (1/2 NS) infusion: 100 mL/h | INTRAVENOUS | @ 20:00:00

## 2022-10-18 MED ADMIN — pantoprazole (Protonix) injection 40 mg: 40 mg | INTRAVENOUS | @ 17:00:00

## 2022-10-18 MED ADMIN — oxyCODONE (ROXICODONE) immediate release tablet 10 mg: 10 mg | ORAL | @ 18:00:00 | Stop: 2022-10-31

## 2022-10-18 MED ADMIN — diphenhydrAMINE (BENADRYL) 50 mg/mL injection: INTRAVENOUS | @ 14:00:00 | Stop: 2022-10-18

## 2022-10-18 MED ADMIN — fat emulsion 20 % with fish oil (SMOFlipid) infusion 250 mL: 250 mL | INTRAVENOUS | @ 01:00:00 | Stop: 2022-10-18

## 2022-10-18 MED ADMIN — baclofen (LIORESAL) tablet 10 mg: 10 mg | ORAL | @ 17:00:00 | Stop: 2022-10-18

## 2022-10-18 MED ADMIN — buprenorphine-naloxone (SUBOXONE) 2-0.5 mg SL film 0.5 mg of buprenorphine: .25 | SUBLINGUAL | @ 13:00:00

## 2022-10-18 MED ADMIN — promethazine (PHENERGAN) 6.5 mg in sodium chloride (NS) 0.9 % 50 mL IVPB: 6.5 mg | INTRAVENOUS | @ 20:00:00

## 2022-10-18 MED ADMIN — promethazine (PHENERGAN) 6.5 mg in sodium chloride (NS) 0.9 % 50 mL IVPB: 6.5 mg | INTRAVENOUS | @ 14:00:00

## 2022-10-18 MED ADMIN — LORazepam (ATIVAN) tablet 0.5 mg: .5 mg | ORAL | @ 18:00:00

## 2022-10-18 MED ADMIN — promethazine (PHENERGAN) 6.5 mg in sodium chloride (NS) 0.9 % 50 mL IVPB: 6.5 mg | INTRAVENOUS | @ 01:00:00

## 2022-10-18 MED ADMIN — famotidine (PF) (PEPCID) injection 20 mg: 20 mg | INTRAVENOUS | @ 02:00:00

## 2022-10-18 MED ADMIN — pantoprazole (Protonix) injection 40 mg: 40 mg | INTRAVENOUS | @ 02:00:00

## 2022-10-18 MED ADMIN — diphenhydrAMINE (BENADRYL) injection: 25 mg | INTRAVENOUS | @ 08:00:00 | Stop: 2022-10-18

## 2022-10-18 MED ADMIN — baclofen (LIORESAL) tablet 10 mg: 10 mg | ORAL | @ 13:00:00 | Stop: 2022-10-18

## 2022-10-18 MED ADMIN — cetirizine (ZYRTEC) tablet 20 mg: 20 mg | GASTROENTERAL | @ 13:00:00

## 2022-10-18 MED ADMIN — diphenhydrAMINE (BENADRYL) injection: 25 mg | INTRAVENOUS | @ 02:00:00

## 2022-10-18 MED ADMIN — methylPREDNISolone sodium succinate (PF) (SOLU-Medrol) injection 40 mg: 40 mg | INTRAVENOUS | @ 16:00:00 | Stop: 2022-10-18

## 2022-10-18 MED ADMIN — nirogacestat (OGSIVEO) tablet 50 mg *Patient Supplied*: 50 mg | ORAL | @ 14:00:00

## 2022-10-18 MED ADMIN — sodium chloride 0.45% (1/2 NS) infusion: 100 mL/h | INTRAVENOUS | @ 10:00:00

## 2022-10-18 MED ADMIN — baclofen (LIORESAL) tablet 10 mg: 10 mg | ORAL | @ 04:00:00

## 2022-10-18 MED ADMIN — promethazine (PHENERGAN) 6.5 mg in sodium chloride (NS) 0.9 % 50 mL IVPB: 6.5 mg | INTRAVENOUS | @ 08:00:00

## 2022-10-18 MED ADMIN — guaiFENesin (ROBITUSSIN) oral syrup: 200 mg | ORAL | @ 04:00:00

## 2022-10-18 MED ADMIN — diphenhydrAMINE (BENADRYL) injection: 25 mg | INTRAVENOUS | @ 14:00:00 | Stop: 2022-10-18

## 2022-10-18 MED ADMIN — Parenteral Nutrition (CENTRAL): INTRAVENOUS | @ 01:00:00 | Stop: 2022-10-18

## 2022-10-18 MED ADMIN — ondansetron (ZOFRAN) injection 4 mg: 4 mg | INTRAVENOUS | @ 13:00:00

## 2022-10-18 MED ADMIN — senna (SENOKOT) tablet 1 tablet: 1 | ORAL | @ 13:00:00 | Stop: 2022-10-18

## 2022-10-18 MED ADMIN — ondansetron (ZOFRAN) injection 4 mg: 4 mg | INTRAVENOUS | @ 04:00:00

## 2022-10-18 MED ADMIN — buprenorphine-naloxone (SUBOXONE) 2-0.5 mg SL film 0.5 mg of buprenorphine: .25 | SUBLINGUAL | @ 04:00:00

## 2022-10-18 MED ADMIN — buprenorphine-naloxone (SUBOXONE) 2-0.5 mg SL film 0.5 mg of buprenorphine: .25 | SUBLINGUAL | @ 17:00:00

## 2022-10-18 MED ADMIN — senna (SENOKOT) tablet 1 tablet: 1 | ORAL | @ 04:00:00

## 2022-10-18 MED ADMIN — mirtazapine (REMERON) tablet 15 mg: 15 mg | GASTROENTERAL | @ 04:00:00

## 2022-10-18 MED ADMIN — diphenhydrAMINE (BENADRYL) injection: 25 mg | INTRAVENOUS | @ 22:00:00

## 2022-10-18 MED ADMIN — nirogacestat (OGSIVEO) tablet 50 mg *Patient Supplied*: 50 mg | ORAL | @ 01:00:00

## 2022-10-18 MED ADMIN — cetirizine (ZYRTEC) tablet 20 mg: 20 mg | GASTROENTERAL | @ 04:00:00

## 2022-10-18 NOTE — Unmapped (Signed)
Hospital Medicine Daily Progress Note    Assessment/Plan:    Principal Problem:    Other acute postprocedural pain  Active Problems:    [name]    Intestinal polyps    [name]    Neoplasm related pain    Physical deconditioning    History of colectomy    Intractable abdominal pain    Generalized anxiety disorder with panic attacks    SBO (small bowel obstruction) (CMS-HCC)    Small intestinal bacterial overgrowth (SIBO)  Resolved Problems:    * No resolved hospital problems. 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    Chronic abdominal pain, nausea 2/2 desmoid tumors and prior surgeries  Ileus, multifactorial but with component of narcotic bowel - resolved  Inability to tolerate PO, without diagnosis of malnutrition - improving  Ileus at this point seems resolved, though patient still struggling with some ongoing nausea. Having small consistent bowel movements with very rare need for intermittent suction. Nausea is improved but certainly not resolved, and she is working on increasing the volume and textures of solid intake.   PLAN  - Appreciate chronic pain recs  - SLP following, regular diet recommended  - Continuing to encourage swallowing pills. Speech to reassess next week. Limited by N/V    > Check Qtc, if </= 450 will increase zofran dose to 8mg  and repeat EKG in 1-2 days  - NG tube clamped  - Continue TPN for nutritional support, reorder daily  - Continue bowel regimen with BID suppository, miralax daily  - Stop senna (cramping and now moving bowels reliably)  - Limit opiates as able    Muscle rigidity and spasms - severe  Occurred post-procedurally, with reassuring evaluation (MRI spine, neuro c/s, CK normal) at that time. Repeat CK on 8/6 was also normal. Rigidity remains quite severe and I am concerned that it is not improving. It is also unclear if her worsening weakness is 2/2 deconditioning or could be related.  PLAN  - Appreciate chronic pain recommendations  - Consulted PM&R, appreciate assistance  - Will likely need to call neurology back for re-evaluation  - Increase baclofen to 15 mg TID (increased on 8/8 and now again on 8/10); monitor for side effects  - Continue ativan TID prn (PTA med), but caution against escalating (noting risks of concurrent benzos + opiates, possibility of contribution to syncope and other symptoms)    Syncope and pre-syncope, uncertain etiology  Possibly vasovagal vs adverse effect of medications vs deconditioning vs iron deficiency. Has improved with weaning opioids but still occurring, last incident 8/6 (LOC per nurse, witnessed). Previously worked up for PE, with negative CTA.  TTE normal. Cosyntropin stim test and thyroid studies normal. Remains in sinus without events (pauses) on telemetry.  PLAN  - Limit polypharmacy  - S/p IV iron    Iron deficiency anemia  Ferritin 5, iron saturation 6%. Has had prior reactions with numerous attempts to give IV iron.   - Pharmacy obtained approval for Feraheme 1020 mg  - Pre-med protocol per A&I. Recommended one  week pre-meds but since inpatient, reasonable to trial after minimum of 48 hours. Patient prefers to proceed with iron as soon as possible, though I did offer to delay 3-4 days to allow for further pre-meds. Started antihistamine 8/7. Plan for steroids to start tonight and IV iron tomorrow morning.   1) Antihistamines:   - Continue Cetirizine 20 mg BID  - On the day of infusion, cetirizine 20 qAM, then IV benadryl 50mg  and famotidine 40 mg IV 30 minutes prior to infusion, then cetirizine 20 mg qPM   - Continue cetirizine 20 mg BID for 72 hours after infusion  2) IVF hydration 30 minutes prior to infusion  3) Steroids: pretreatment with 50mg  prednisone 13 hours prior to the infusion, 50mg  prednisone 7 hours prior to the infusion, then the solumedrol dose 40mg  (IV dose) prior to the infusion.   - Continues to have itching, some erythema but no hives, vital sign changes, or concerning features  - Allow for additional IV benadryl 25 as needed for up to 72 hours after infusion (day 2/3)    Transaminitis - resolving    Headaches  Minimal improvement with IV fluids, magnesium, IV Benadryl. Concerning for rebound/medication overuse headache. Headache has improved with Tylenol holiday and ketamine though not completely resolved. Though her chemotherapy also has headaches as a common side effect.  PLAN  - Enteral PRN tylenol, fioricet daily prn could be considered if remains severe  - Avoid NSAIDs, avoid IV Benadryl    Desmoid tumors - [name]  - Restarting nirogacestat at lower dose of 50 mg BID on 8/8 after discussion with primary oncologist Dr. Yates Decamp cough  Patient with some coarse breath sounds bilaterally. CXR 8/5 clear.  - Add PRN robitussin  - Encourage incentive spirometry    Chronic/stable/resolved problems:  SIBO: Hold home cefdinir, doxy    FEN: Encourage regular diet, TPN  VTE ppx: SCDs ordered, mobilize as able  Code status: Full code  Dispo: Home with HH PT/OT/TPN vs SNF pending ability to stand/walk and removal of NG tube    I personally spent 60 minutes face-to-face and non-face-to-face in the care of this patient, which includes all pre, intra, and post visit time on the date of service.  All documented time was specific to the E/M visit and does not include any procedures that may have been performed.    Chauncy Lean, MD  Hospital Medicine   ___________________________________________________________________    Subjective:  Remains worried about the rigidity. Having burning bowel movements. Went ~6 times yesterday. Taking small bites/amounts of food by mouth. Having ongoing severe nausea but no large volume emesis. Has been through a lot and is feeling a little discouraged, anxious about how sick she is this stay. She is motivated and willing to try/do what we all ask of her. Mother bedside. All questions addressed, though we do not have an answer for some of her questions - e.g., rigidity cause/solution.     Labs/Studies:  Labs per EMR and Reviewed (last 24hrs)    Objective:  BP 108/49  - Pulse 71  - Temp 36.6 ??C (97.8 ??F) (Oral)  - Resp 13  - Ht 165.1 cm (5' 5)  - Wt 60.2 kg (132 lb 11.5 oz)  - SpO2 99%  - BMI 22.09 kg/m??     GEN: No acute distress, resting in bed, mother bedside  HEENT: NG tube in place, pale conjunctiva, MMM, normal hearing  CV: Normal rate, well perfused  ABD:  Soft, bowel sounds present in all 4 quadrants  EXT: Grossly symmetric, very mild swelling in bilateral feet but not legs  SKIN: Scratching arms/chest with some mild erythema but no hives  NEURO: Muscle rigidity in lower extremities (pronounced), generalized ~4/5 weakness, non-focal exam  PSYCH: Appropriate affect, calm

## 2022-10-18 NOTE — Unmapped (Signed)
Iron infusion today with multiple administrations of benadryl post pre-meds. Ambulated 50 ft.    Problem: Adult Inpatient Plan of Care  Goal: Plan of Care Review  Outcome: Ongoing - Unchanged  Goal: Patient-Specific Goal (Individualized)  Outcome: Ongoing - Unchanged  Goal: Absence of Hospital-Acquired Illness or Injury  Outcome: Ongoing - Unchanged  Intervention: Identify and Manage Fall Risk  Recent Flowsheet Documentation  Taken 10/17/2022 0800 by Franchot Erichsen, RN  Safety Interventions:   aspiration precautions   bleeding precautions   family at bedside   lighting adjusted for tasks/safety   low bed   fall reduction program maintained  Intervention: Prevent Skin Injury  Recent Flowsheet Documentation  Taken 10/17/2022 1800 by Franchot Erichsen, RN  Positioning for Skin: Right  Taken 10/17/2022 1600 by Franchot Erichsen, RN  Positioning for Skin: Left  Taken 10/17/2022 1400 by Franchot Erichsen, RN  Positioning for Skin: Right  Taken 10/17/2022 1200 by Franchot Erichsen, RN  Positioning for Skin: Right  Taken 10/17/2022 1000 by Franchot Erichsen, RN  Positioning for Skin: Left  Taken 10/17/2022 0800 by Franchot Erichsen, RN  Positioning for Skin: Right  Device Skin Pressure Protection:   tubing/devices free from skin contact   adhesive use limited   absorbent pad utilized/changed  Skin Protection:   adhesive use limited   transparent dressing maintained   tubing/devices free from skin contact  Intervention: Prevent Infection  Recent Flowsheet Documentation  Taken 10/17/2022 0800 by Franchot Erichsen, RN  Infection Prevention:   personal protective equipment utilized   rest/sleep promoted   single patient room provided  Goal: Optimal Comfort and Wellbeing  Outcome: Ongoing - Unchanged  Goal: Readiness for Transition of Care  Outcome: Ongoing - Unchanged  Goal: Rounds/Family Conference  Outcome: Ongoing - Unchanged     Problem: Latex Allergy  Goal: Absence of Allergy Symptoms  Outcome: Ongoing - Unchanged     Problem: Fall Injury Risk  Goal: Absence of Fall and Fall-Related Injury  Outcome: Ongoing - Unchanged  Intervention: Promote Injury-Free Environment  Recent Flowsheet Documentation  Taken 10/17/2022 0800 by Franchot Erichsen, RN  Safety Interventions:   aspiration precautions   bleeding precautions   family at bedside   lighting adjusted for tasks/safety   low bed   fall reduction program maintained     Problem: Skin Injury Risk Increased  Goal: Skin Health and Integrity  Outcome: Ongoing - Unchanged  Intervention: Optimize Skin Protection  Recent Flowsheet Documentation  Taken 10/17/2022 1600 by Franchot Erichsen, RN  Head of Bed [facility]) Positioning: HOB at 30-45 degrees  Taken 10/17/2022 1200 by Franchot Erichsen, RN  Head of Bed [facility]) Positioning: HOB at 30-45 degrees  Taken 10/17/2022 0830 by Franchot Erichsen, RN  Head of Bed [facility]) Positioning: HOB at 30-45 degrees  Taken 10/17/2022 0800 by Franchot Erichsen, RN  Activity Management: up ad lib  Pressure Reduction Techniques:   frequent weight shift encouraged   heels elevated off bed   weight shift assistance provided  Head of Bed (HOB) Positioning: HOB at 30-45 degrees  Pressure Reduction Devices: pressure-redistributing mattress utilized  Skin Protection:   adhesive use limited   transparent dressing maintained   tubing/devices free from skin contact     Problem: Self-Care Deficit  Goal: Improved Ability to Complete Activities of Daily Living  Outcome: Ongoing - Unchanged     Problem: Comorbidity Management  Goal: Blood Glucose Levels Within Targeted Range  Outcome: Ongoing -  Unchanged  Goal: Blood Pressure in Desired Range  Outcome: Ongoing - Unchanged

## 2022-10-18 NOTE — Unmapped (Signed)
Pt [name] [name], OOB with 1-2 person steadying assist. Continues with itchiness post-infusion of iron on 8/9 - given prn benadryl per order. Receiving zofran and phenergan for nausea - PO intake encouraged as able. VSS. On [place] 2L; cough syrup given x1. NGT in place for meds. TPN and lipids infusing. Mom present and assisting in care.       Problem: Adult Inpatient Plan of Care  Goal: Plan of Care Review  Outcome: Ongoing - Unchanged  Goal: Patient-Specific Goal (Individualized)  Outcome: Ongoing - Unchanged  Goal: Absence of Hospital-Acquired Illness or Injury  Outcome: Ongoing - Unchanged  Intervention: Identify and Manage Fall Risk  Recent Flowsheet Documentation  Taken 10/17/2022 2000 by Estella Husk, RN  Safety Interventions:   low bed   lighting adjusted for tasks/safety   family at bedside   fall reduction program maintained  Intervention: Prevent Skin Injury  Recent Flowsheet Documentation  Taken 10/18/2022 0400 by Estella Husk, RN  Positioning for Skin: Supine/Back  Taken 10/18/2022 0200 by Estella Husk, RN  Positioning for Skin: Supine/Back  Taken 10/18/2022 0000 by Estella Husk, RN  Positioning for Skin: Left  Taken 10/17/2022 2000 by Estella Husk, RN  Positioning for Skin: Supine/Back  Device Skin Pressure Protection: absorbent pad utilized/changed  Skin Protection: incontinence pads utilized  Intervention: Prevent Infection  Recent Flowsheet Documentation  Taken 10/17/2022 2000 by Estella Husk, RN  Infection Prevention:   hand hygiene promoted   rest/sleep promoted  Goal: Optimal Comfort and Wellbeing  Outcome: Ongoing - Unchanged  Goal: Readiness for Transition of Care  Outcome: Ongoing - Unchanged  Goal: Rounds/Family Conference  Outcome: Ongoing - Unchanged     Problem: Latex Allergy  Goal: Absence of Allergy Symptoms  Outcome: Ongoing - Unchanged     Problem: Fall Injury Risk  Goal: Absence of Fall and Fall-Related Injury  Outcome: Ongoing - Unchanged  Intervention: Promote Injury-Free Environment  Recent Flowsheet Documentation  Taken 10/17/2022 2000 by Estella Husk, RN  Safety Interventions:   low bed   lighting adjusted for tasks/safety   family at bedside   fall reduction program maintained     Problem: Skin Injury Risk Increased  Goal: Skin Health and Integrity  Outcome: Ongoing - Unchanged  Intervention: Optimize Skin Protection  Recent Flowsheet Documentation  Taken 10/18/2022 0400 by Estella Husk, RN  Activity Management: ambulated to bathroom  Head of Bed [facility]) Positioning: HOB at 30-45 degrees  Taken 10/18/2022 0200 by Estella Husk, RN  Head of Bed [facility]) Positioning: HOB at 30-45 degrees  Taken 10/18/2022 0000 by Estella Husk, RN  Head of Bed [facility]) Positioning: HOB at 30-45 degrees  Taken 10/17/2022 2300 by Estella Husk, RN  Activity Management: ambulated to bathroom  Taken 10/17/2022 2200 by Estella Husk, RN  Head of Bed [facility]) Positioning: HOB at 30-45 degrees  Taken 10/17/2022 2050 by Estella Husk, RN  Activity Management: ambulated to bathroom  Taken 10/17/2022 2000 by Estella Husk, RN  Pressure Reduction Techniques: frequent weight shift encouraged  Head of Bed (HOB) Positioning: HOB at 30-45 degrees  Pressure Reduction Devices: pressure-redistributing mattress utilized  Skin Protection: incontinence pads utilized     Problem: Self-Care Deficit  Goal: Improved Ability to Complete Activities of Daily Living  Outcome: Ongoing - Unchanged     Problem: Comorbidity Management  Goal: Blood Glucose Levels Within Targeted Range  Outcome: Ongoing - Unchanged  Goal: Blood Pressure in Desired Range  Outcome:  Ongoing - Unchanged     Problem: Nausea and Vomiting  Goal: Nausea and Vomiting Relief  Outcome: Ongoing - Unchanged

## 2022-10-18 NOTE — Unmapped (Signed)
Department of Anesthesiology  Pain Medicine Division    Chronic Pain Followup Inpatient Consult Note    Requesting Attending Physician:  Suzzanne Cloud, MD  Service Requesting Consult:  [facility])    Assessment/Recommendations:  The patient was seen in consultation on request of Rocco Serene, MD regarding assistance with pain management for patient's chronic intractable abdominal pain with now acute on chronic recurrent ileus/NG tube placement.  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      Patient currently established with [facility] pain management center Dr. Dyann Ruddle, has been on oxycodone 10 mg 3 times daily, Suboxone 0.5 mg 3 times daily along with baclofen and pregabalin. Overall goal is to wean to buprenorphine and non-opioid medications and eventually off of buprenorphine as well. Through her psychiatrist she is on Ativan 0.5 mg 3 times daily as needed. She has contracted to avoid taking the Ativan at the same time or close to when she takes her opioid pain medications.    Patient has had multiple prior inpatient stays either regarding acute exacerbation of chronic pain or ileus.  In the past 12 months she has had 2 infusions of ketamine with limited benefit (last infusion in May 2024) and at one point there was some transaminitis which was thought to be attributed from other etiologies like TPN versus other intra-abdominal pathologies.  In the past patient also has had lidocaine infusions during inpatient stay with limited/variable benefit.        Regarding ileus and her constipation, After discussion with the patient and mother we came to an agreement to attempt with a low-dose of naloxegol 12.5 mg, she has taken Alvimopan around surgery time before and seemed to tolerate it. GI team recommended against neostigmine at this time. We expect the naloxegol to increase her abdominal pain at first but hope that we can continue it and even increase to 25mg  if able.     This is no doubt a very complex situation. Her biggest issue at this point seems to be the ileus. Medication management should focus on non-opioid medications, although she has trialed others in past with side effects or minimal benefit.  Hopefully minimizing opioids and working on her ileus will help get her out of the hospital quicker than previously.  Our goal while admitted would be for her to be able to manage her pain without the need for IV opioids and instead take oxycodone up to 15 mg 4 times daily as needed, with the goal of trying to get as close to oxycodone 10 mg 3 times daily as needed as possible.    Her Suboxone is currently increased from her home dose in an attempt to provide optimal basal control of her pain, favoring an increase in buprenorphine which is a partial mu opioid agonist over use of full mu opioid agonists, which can have worse GI side effect profiles.  Again, ultimately the goal would be to wean off of oxy and manage her pain with buprenorphine alone.     Has had lidocaine gtt this admission with most help. Has been avoiding IV HM with success. Has been having HA's c/w tension type headaches with likely rebound/medication over use component.     Interval Events: Ketamine infusion stopped yesterday. Pt only required 1x PO oxycodone dose. Still having pain but trying to make it without needing PRN opioids. Still having  some nausea.    Vital Sign Trends: Vitally stable, afebrile.    Hospital Analgesic Medications:  Baclofen 10 mg TID  Suboxone 1-0.5 mg SL TID  APAP 650 mg q6 PRN  Ketamine 0.25 mg/kg/hr 8/1-9 (stopped 8/9)    Recommendations:  -The chronic pain service is a consult service and does not place orders, just makes recommendations (except ketamine and lidocaine infusions)   -Please evaluate all patients on opioids for appropriateness of prescribing narcan at discharge. The chronic pain service can assist with this.  Nasal narcan is covered by most insurances.  -Recommendations given apply to the current hospitalization and do not reflect long term recommendations.    - Continue Baclofen 10 mg TID  - Continue PRN acetaminophen  - Continue Suboxone 2-0.5  -Okay to treat pain flares with as needed oxycodone. Discussed with hospitalist, Dr. Nilda Riggs, will add oxycodone 10 mg BID PRN. Would like to focus on treating acute pain, getting her medically stable, and have her follow up with [name] for any further titration of chronic pain medication.   - Continue IV APAP holiday for concern for rebound headache but may have PRN g-tube doses  - Pt will continue to try to avoid PRN Ativan use  - If headaches are not improved from IV Tylenol holiday, consider Fioricet once daily as needed for headaches  - Recommend continued physical therapy to work on bed level mobility   - Recommend involvement of inpatient psychology and complex care given patient's distress surrounding hospitalization  - Agree with neurology consultation for rigidity and shaking episodes.  - Patient would likely benefit from pain psychology evaluation post-discharge (in [facility])    We will continue to follow.    Naloxone Rx at discharge?  Is patient on opioids? Yes.  1)Is dose >50MME?  Yes.  2) Is patient prescribed a benzodiazepine (w opioids)? Yes.  3)Hx of overdose?  No.  4) Hx of substance use disorder? No.  5) Opioids likely to last greater than a week after discharge? Yes.     If yes to 2 or more, prescribe naloxone at discharge.  Nasal narcan for most insured (Nasal narcan 4mg /actuation, prescribe 1 kit, instructions at [email or web address]).  For uninsured, chronic pain can work to assist in finding an option.  OTC nasal narcan now available at most pharmacies for around $45.    Interim History  There were no Acute Events Overnight.  The patient is not obtaining adequate pain relief on current medication regimen and feels that their pain is Somewhat controlled    Inpatient Medications  Current Facility-Administered Medications   Medication Dose Route Frequency Provider Last Rate Last Admin    acetaminophen (TYLENOL) tablet 650 mg  650 mg Enteral tube: gastric Q6H PRN Suzzanne Cloud, MD        aluminum-magnesium hydroxide-simethicone (MAALOX PLUS) 200-200-20 mg/5 mL suspension 60 mL  60 mL Oral Q6H PRN Suzzanne Cloud, MD        baclofen (LIORESAL) tablet 10 mg  10 mg Oral TID Kateri Plummer, MD   10 mg at 10/18/22 0857    bisacodyl (DULCOLAX) suppository 10 mg  10 mg Rectal BID Lambert Mody, MD PhD   10 mg at 10/14/22 2055    buprenorphine-naloxone (SUBOXONE) 2-0.5 mg SL film 0.5 mg of buprenorphine  0.25 Film Sublingual TID Lambert Mody, MD PhD   0.5 mg of buprenorphine at 10/18/22 0857    calcium carbonate (TUMS) chewable tablet 400 mg elem  calcium  400 mg elem calcium Oral Daily PRN Terri Piedra, AGNP        cetirizine (ZYRTEC) tablet 20 mg  20 mg Enteral tube: gastric BID Kateri Plummer, MD   20 mg at 10/18/22 0856    CHEMO CLARIFICATION ORDER   Other Continuous PRN Lestine Box, PharmD        diphenhydrAMINE (BENADRYL) capsule/tablet 25 mg  25 mg Oral Q6H PRN Suzzanne Cloud, MD        famotidine (PF) (PEPCID) injection 20 mg  20 mg Intravenous At bedtime Rocco Serene, MD   20 mg at 10/17/22 2136    fat emulsion 20 % (INTRAlipid) infusion 100 mL  100 mL Intravenous Once PRN Lobonc, Ammie Ferrier, MD        fat emulsion 20 % (INTRAlipid) infusion 200 mL  200 mL Intravenous Once PRN Lobonc, Ammie Ferrier, MD        Parenteral Nutrition (CENTRAL)   Intravenous Continuous Suzzanne Cloud, MD        And    fat emulsion 20 % with fish oil (SMOFlipid) infusion 250 mL  250 mL Intravenous Continuous Suzzanne Cloud, MD        guaiFENesin (ROBITUSSIN) oral syrup  200 mg Oral Q4H PRN Kateri Plummer, MD   200 mg at 10/17/22 2344    IP OKAY TO TREAT   Other Continuous PRN Sheral Apley, MD        LORazepam (ATIVAN) tablet 0.5 mg  0.5 mg Oral TID PRN Lambert Mody, MD PhD   0.5 mg at 10/17/22 0554    mirtazapine (REMERON) tablet 15 mg  15 mg Enteral tube: gastric Nightly Kateri Plummer, MD   15 mg at 10/17/22 2338    naloxegol (MOVANTIK) tablet 25 mg  25 mg Oral Daily Kateri Plummer, MD   25 mg at 10/17/22 1660    nirogacestat (OGSIVEO) tablet 50 mg *Patient Supplied*  50 mg Oral BID Sheral Apley, MD   50 mg at 10/18/22 0958    ondansetron (ZOFRAN) injection 4 mg  4 mg Intravenous Q6H PRN Terri Piedra, AGNP   4 mg at 10/18/22 6301    oxyCODONE (ROXICODONE) immediate release tablet 10 mg  10 mg Oral BID PRN Kateri Plummer, MD   10 mg at 10/17/22 1545    pantoprazole (Protonix) injection 40 mg  40 mg Intravenous BID Rocco Serene, MD   40 mg at 10/17/22 2136    Parenteral Nutrition (CENTRAL)   Intravenous Continuous Kateri Plummer, MD 45 mL/hr at 10/18/22 0400 Rate Verify at 10/18/22 0400    phenol (CHLORASEPTIC) 1.4 % spray 2 spray  2 spray Mucous Membrane Q2H PRN Rocco Serene, MD   2 spray at 10/03/22 0138    polyethylene glycol (MIRALAX) packet 17 g  17 g Oral Daily PRN Suzzanne Cloud, MD        promethazine (PHENERGAN) 6.5 mg in sodium chloride (NS) 0.9 % 50 mL IVPB  6.5 mg Intravenous Q6H PRN Suzzanne Cloud, MD   Stopped at 10/18/22 1019    sodium chloride (NS) 0.9 % infusion  10 mL/hr Intravenous Continuous Nat Christen., MD        sodium chloride 0.45% (1/2 NS) infusion  100 mL/hr Intravenous Continuous [name], MD 100 mL/hr at 10/18/22 0549 100 mL/hr at 10/18/22 0549    zinc oxide-cod liver oil (DESITIN 40%) Paste  Topical Daily PRN Arman Filter, MD   Given at 10/13/22 1015     Objective:     Vital Signs    Temp:  [36.5 ??C (97.7 ??F)-36.9 ??C (98.4 ??F)] 36.7 ??C (98.1 ??F)  Heart Rate:  [71-90] 71  SpO2 Pulse: [68-89] 71  Resp:  [11-19] 13  BP: (107-120)/(49-62) 108/49  MAP (mmHg):  [65-78] 65  SpO2:  [96 %-99 %] 99 %    Physical Exam     GENERAL:  Well developed, well-nourished female in no distress.  HEAD/NECK:    Normocephalic/atraumatic. NGT present.  CARDIOVASCULAR:   WWP, regular tate  LUNGS:   Normal work of breathing, [place]  ABDOMEN  Soft, non-distended.  EXTREMITIES:  Warm and well-perfused.  NEUROLOGIC:    The patient was alert and oriented times four with normal language, attention, cognition and memory. Cranial nerve exam was grossly normal.    MUSCULOSKELETAL:    SKIN:  No obvious rashes lesions or erythema  PSY:  Appropriate affect and mood.    Test Results    Lab Results   Component Value Date    CREATININE 0.43 (L) 10/18/2022     Lab Results   Component Value Date    ALKPHOS 70 10/18/2022    BILITOT <0.2 (L) 10/18/2022    BILIDIR <0.10 10/11/2022    PROT 5.8 10/18/2022    ALBUMIN 3.2 (L) 10/18/2022    ALT 79 (H) 10/18/2022    AST 18 10/18/2022     Problem List    Principal Problem:    Other acute postprocedural pain  Active Problems:    [name]    Intestinal polyps    [name]    Neoplasm related pain    Physical deconditioning    History of colectomy    Intractable abdominal pain    Generalized anxiety disorder with panic attacks    SBO (small bowel obstruction) (CMS-HCC)    Small intestinal bacterial overgrowth (SIBO)    Rebeca Alert, DO  Addiction Medicine Fellow  10/18/22

## 2022-10-18 NOTE — Unmapped (Signed)
Adolescent and Young Adult [facility] Program Visit     Service date: October 17, 2022    Encounter location: Inpatient - Adult     Clinician: [name], LCSW - AYA Clinical Social Worker     Patient identifiers: [name] [name] is a 22 y.o. with desmoid tumors and [name]. [name] [name] uses she/her pronouns.      Visit Summary     Met with the patient for evaluation of needs that may be addressed by [facility].    Provided supportive counseling.    Follow Up/Plan:     Will see in two weeks.    I will continue to follow this patient for AYA-appropriate support. They have my contact information and have been encouraged to contact me as needed.     Flowsheet     AYA Assessment  Primary Caregiver: Parent  Location Seen: Inpatient  Contact Point: During treatment  Referral Source: AYA team  Issues Discussed: Mental Health  AYA Team Interventions: Psychoeducation, Supportive counseling  Referrals Made: Psychiatry  Follow Up: As needed  Time Spent (in minutes): 85    10/17/2022     [name], LCSW  Adolescent and Young Adult Visual merchandiser

## 2022-10-19 LAB — BASIC METABOLIC PANEL
ANION GAP: 6 mmol/L (ref 5–14)
BLOOD UREA NITROGEN: 12 mg/dL (ref 9–23)
BUN / CREAT RATIO: 28
CALCIUM: 9.2 mg/dL (ref 8.7–10.4)
CHLORIDE: 111 mmol/L — ABNORMAL HIGH (ref 98–107)
CO2: 25 mmol/L (ref 20.0–31.0)
CREATININE: 0.43 mg/dL — ABNORMAL LOW
EGFR CKD-EPI (2021) FEMALE: >90 mL/min/{1.73_m2} (ref >=60)
GLUCOSE RANDOM: 118 mg/dL (ref 70–179)
POTASSIUM: 3.9 mmol/L (ref 3.4–4.8)
SODIUM: 142 mmol/L (ref 135–145)

## 2022-10-19 LAB — PHOSPHORUS: PHOSPHORUS: 3.2 mg/dL (ref 2.4–5.1)

## 2022-10-19 LAB — MAGNESIUM: MAGNESIUM: 2.1 mg/dL (ref 1.6–2.6)

## 2022-10-19 LAB — FOLATE: FOLATE: 13.9 ng/mL (ref >=5.4)

## 2022-10-19 MED ADMIN — diphenhydrAMINE (BENADRYL) injection: 25 mg | INTRAVENOUS | @ 04:00:00 | Stop: 2022-10-19

## 2022-10-19 MED ADMIN — diphenhydrAMINE (BENADRYL) capsule/tablet 25 mg: 25 mg | ORAL | @ 11:00:00 | Stop: 2022-10-19

## 2022-10-19 MED ADMIN — LORazepam (ATIVAN) tablet 0.5 mg: .5 mg | ORAL | @ 07:00:00

## 2022-10-19 MED ADMIN — promethazine (PHENERGAN) 6.5 mg in sodium chloride (NS) 0.9 % 50 mL IVPB: 6.5 mg | INTRAVENOUS | @ 07:00:00

## 2022-10-19 MED ADMIN — diphenhydrAMINE (BENADRYL) capsule 50 mg: 50 mg | ORAL | @ 20:00:00

## 2022-10-19 MED ADMIN — buprenorphine-naloxone (SUBOXONE) 2-0.5 mg SL film 0.5 mg of buprenorphine: .25 | SUBLINGUAL | @ 18:00:00

## 2022-10-19 MED ADMIN — diphenhydrAMINE (BENADRYL) injection: 25 mg | INTRAVENOUS | @ 01:00:00 | Stop: 2022-10-18

## 2022-10-19 MED ADMIN — ondansetron (ZOFRAN) injection 4 mg: 4 mg | INTRAVENOUS | @ 04:00:00 | Stop: 2022-10-19

## 2022-10-19 MED ADMIN — famotidine (PF) (PEPCID) injection 20 mg: 20 mg | INTRAVENOUS | @ 02:00:00

## 2022-10-19 MED ADMIN — cetirizine (ZYRTEC) tablet 20 mg: 20 mg | GASTROENTERAL | @ 01:00:00

## 2022-10-19 MED ADMIN — ondansetron (ZOFRAN) injection 4 mg: 4 mg | INTRAVENOUS | @ 11:00:00 | Stop: 2022-10-19

## 2022-10-19 MED ADMIN — pantoprazole (Protonix) injection 40 mg: 40 mg | INTRAVENOUS | @ 02:00:00

## 2022-10-19 MED ADMIN — mirtazapine (REMERON) tablet 15 mg: 15 mg | GASTROENTERAL | @ 04:00:00

## 2022-10-19 MED ADMIN — oxyCODONE (ROXICODONE) immediate release tablet 10 mg: 10 mg | ORAL | @ 01:00:00 | Stop: 2022-10-31

## 2022-10-19 MED ADMIN — ondansetron (ZOFRAN) injection 4 mg: 4 mg | INTRAVENOUS | @ 17:00:00

## 2022-10-19 MED ADMIN — buprenorphine-naloxone (SUBOXONE) 2-0.5 mg SL film 0.5 mg of buprenorphine: .25 | SUBLINGUAL | @ 13:00:00

## 2022-10-19 MED ADMIN — promethazine (PHENERGAN) 6.5 mg in sodium chloride (NS) 0.9 % 50 mL IVPB: 6.5 mg | INTRAVENOUS | @ 13:00:00

## 2022-10-19 MED ADMIN — polyethylene glycol (MIRALAX) packet 17 g: 17 g | ORAL | @ 16:00:00

## 2022-10-19 MED ADMIN — nirogacestat (OGSIVEO) tablet 50 mg *Patient Supplied*: 50 mg | ORAL | @ 14:00:00

## 2022-10-19 MED ADMIN — baclofen (LIORESAL) tablet 15 mg: 15 mg | ORAL | @ 01:00:00

## 2022-10-19 MED ADMIN — nirogacestat (OGSIVEO) tablet 50 mg *Patient Supplied*: 50 mg | ORAL | @ 01:00:00

## 2022-10-19 MED ADMIN — pantoprazole (Protonix) injection 40 mg: 40 mg | INTRAVENOUS | @ 16:00:00

## 2022-10-19 MED ADMIN — Parenteral Nutrition (CENTRAL): INTRAVENOUS | @ 02:00:00 | Stop: 2022-10-19

## 2022-10-19 MED ADMIN — oxyCODONE (ROXICODONE) immediate release tablet 10 mg: 10 mg | ORAL | @ 20:00:00 | Stop: 2022-10-31

## 2022-10-19 MED ADMIN — promethazine (PHENERGAN) 6.5 mg in sodium chloride (NS) 0.9 % 50 mL IVPB: 6.5 mg | INTRAVENOUS | @ 20:00:00

## 2022-10-19 MED ADMIN — baclofen (LIORESAL) tablet 15 mg: 15 mg | ORAL | @ 18:00:00

## 2022-10-19 MED ADMIN — naloxegol (MOVANTIK) tablet 25 mg: 25 mg | ORAL | @ 13:00:00

## 2022-10-19 MED ADMIN — diphenhydrAMINE (BENADRYL) capsule 50 mg: 50 mg | ORAL | @ 13:00:00

## 2022-10-19 MED ADMIN — buprenorphine-naloxone (SUBOXONE) 2-0.5 mg SL film 0.5 mg of buprenorphine: .25 | SUBLINGUAL | @ 01:00:00

## 2022-10-19 MED ADMIN — cetirizine (ZYRTEC) tablet 20 mg: 20 mg | GASTROENTERAL | @ 13:00:00

## 2022-10-19 MED ADMIN — fat emulsion 20 % with fish oil (SMOFlipid) infusion 250 mL: 250 mL | INTRAVENOUS | @ 02:00:00 | Stop: 2022-10-19

## 2022-10-19 MED ADMIN — acetaminophen (TYLENOL) tablet 650 mg: 650 mg | GASTROENTERAL | @ 01:00:00

## 2022-10-19 MED ADMIN — baclofen (LIORESAL) tablet 15 mg: 15 mg | ORAL | @ 13:00:00

## 2022-10-19 MED ADMIN — LORazepam (ATIVAN) tablet 0.5 mg: .5 mg | ORAL | @ 20:00:00

## 2022-10-19 MED ADMIN — acetaminophen (TYLENOL) tablet 650 mg: 650 mg | GASTROENTERAL | @ 20:00:00

## 2022-10-19 NOTE — Unmapped (Signed)
Initial Consult Note        Requesting Attending Physician:  Suzzanne Cloud, MD  Service Requesting Consult: [facility])     Assessment and Plan          [name] [name] is a 22 y.o.  female on whom I have been asked by Suzzanne Cloud, MD to consult for increased lower extremity tone and weakness.    Increased Tone - Generalized Weakness  Initially she was noted to have worsening BLE stiffness since procedure on 7/18. 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 This may represent further worsening of chronic muscle stiffness and spasms for which she was already on baclofen though increased dose has not been beneficial. Less likely acute dystonic reaction given persistent of symptoms without readministration of anti-dopaminergic medications. Clinical picture not consistent with neuroleptic malignant disorder or serotonin syndrome. Serum paraneoplastic antibody panel was weakly positive for P/Q-type Ca channel antibody which is unlikely to be clinically significant and does not fit her clinica picture. Increased tone is still suggestive of a central process and she would benefit from repeat MRI Brain w/wo. This may also represent a generalized dystonia due to CNS process or global process to include vitamin and trace mineral deficiencies and excesses. Given her extend periods on TPN she would benefit from checking nutritional levels. EMG may also be helpful in localizing a lesion not well capture on MRI and can be useful in ruling out neuropathic and myopathic etiologies.     RECOMMENDATIONS:   Obtain MRI Brain w/wo  Obtain Vit B1, B2, B3, B6, B9, D, E; copper, manganese, zinc  Consider potential EMG after reevaluations tomorrow    This patient was discussed with Dr. Peterson Ao, who agrees to the assessment and plan. Dr. Emmie Niemann was available.     Della Goo, MD, PhD  Adult Neurology, PGY-3  [facility]  Pager [phone]         HPI        Reason for Consult: Increased lower extremity tone and weakness    [name] [name] is a 22 y.o.  female on whom I have been asked by Suzzanne Cloud, MD to consult for increased lower extremity tone and weakness.    Patient was admitted on 7/18 for pain following steroid/anesthetic injection of desmoid tumor in the right forearm and left upper chest under general anesthesia. She has chronic pain and follows with pain management. 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      She had a RRT on 7/19 for chest pain, diaphoresis, tachycardia, retching and abdominal tenderness with continued complaint of pain. Reportedly patient was conversant and following commands. Vitals were stable and patient was afebrile. Primary team noted flexion of bilateral arms and legs with question of rigidity.     Notable medications include suboxone, ativan 0.5mg  TID PRN, oxycodone PRN, and baclofen 15mg  TID. She is also onnirogacestat (a notch receptor pathway inhibitor) for her tumors.    Since admission she has continued to have persistently increased tone and worse weakness now involving her upper extremities. She is unsure if the weakness is due to deconditioning given difficulty walking due to stiffness or whether it is separate. She does not feel that the increase in baclofen has been helpful. She felt IV Robaxin was the most helpful. She feels more calm with Ativan but is  unsure if that helps with her tone.    Allergies   Allergen Reactions    Adhesive Tape-Silicones Hives and Rash     Paper tape  And tegederm ok    Ferrlecit [Sodium Ferric Gluconat-Sucrose] Swelling and Rash    Levofloxacin Swelling and Rash     Swelling in mouth, rash,     Methylnaltrexone      Per Patient: I lost bowel control, severe abdominal cramping, and elevated BP    Neomycin Swelling     Rxn after ear drops; ear swelling Papaya Hives    Morphine Nausea And Vomiting    Zosyn [Piperacillin-Tazobactam] Itching and Rash     Red and itchy    Compazine [Prochlorperazine] Other (See Comments)     Extreme agitation    Iron Analogues     Iron Dextran Itching     Received iron dextran 06/08/22 over 12 hours, had itching and redness/flushing during the infusion and for a couple days after. Required IV benadryl w/flares between doses and ultimately treated w/IV methylpred for 2 days.     Latex, Natural Rubber Rash      Current Facility-Administered Medications   Medication Dose Route Frequency Provider Last Rate Last Admin    acetaminophen (TYLENOL) tablet 650 mg  650 mg Enteral tube: gastric Q6H PRN Suzzanne Cloud, MD   650 mg at 10/19/22 1536    aluminum-magnesium hydroxide-simethicone (MAALOX PLUS) 200-200-20 mg/5 mL suspension 60 mL  60 mL Oral Q6H PRN Suzzanne Cloud, MD        baclofen (LIORESAL) tablet 15 mg  15 mg Oral TID Suzzanne Cloud, MD   15 mg at 10/19/22 1415    bisacodyl (DULCOLAX) suppository 10 mg  10 mg Rectal BID Lambert Mody, MD PhD   10 mg at 10/14/22 2055    buprenorphine-naloxone (SUBOXONE) 2-0.5 mg SL film 0.5 mg of buprenorphine  0.25 Film Sublingual TID Lambert Mody, MD PhD   0.5 mg of buprenorphine at 10/19/22 1415    calcium carbonate (TUMS) chewable tablet 400 mg elem calcium  400 mg elem calcium Oral Daily PRN Terri Piedra, AGNP        cetirizine (ZYRTEC) tablet 20 mg  20 mg Enteral tube: gastric BID Kateri Plummer, MD   20 mg at 10/19/22 0847    CHEMO CLARIFICATION ORDER   Other Continuous PRN Lestine Box, PharmD        diphenhydrAMINE (BENADRYL) capsule 50 mg  50 mg Oral Q6H PRN Suzzanne Cloud, MD   50 mg at 10/19/22 1536    famotidine (PF) (PEPCID) injection 20 mg  20 mg Intravenous At bedtime Rocco Serene, MD   20 mg at 10/18/22 2228    fat emulsion 20 % (INTRAlipid) infusion 100 mL  100 mL Intravenous Once PRN Lobonc, Ammie Ferrier, MD fat emulsion 20 % (INTRAlipid) infusion 200 mL  200 mL Intravenous Once PRN Lobonc, Ammie Ferrier, MD        Parenteral Nutrition (CENTRAL)   Intravenous Continuous Suzzanne Cloud, MD        And    fat emulsion 20 % with fish oil (SMOFlipid) infusion 250 mL  250 mL Intravenous Continuous Suzzanne Cloud, MD        guaiFENesin [facility]) oral syrup  200 mg Oral Q4H PRN Kateri Plummer, MD   200 mg at 10/17/22 2344    IP OKAY TO TREAT   Other Continuous PRN Sheral Apley, MD  LORazepam (ATIVAN) tablet 0.5 mg  0.5 mg Oral TID PRN Lambert Mody, MD PhD   0.5 mg at 10/19/22 1536    mirtazapine (REMERON) tablet 15 mg  15 mg Enteral tube: gastric Nightly Kateri Plummer, MD   15 mg at 10/19/22 0007    naloxegol (MOVANTIK) tablet 25 mg  25 mg Oral Daily Kateri Plummer, MD   25 mg at 10/19/22 0847    nirogacestat (OGSIVEO) tablet 50 mg *Patient Supplied*  50 mg Oral BID Sheral Apley, MD   50 mg at 10/19/22 0944    ondansetron (ZOFRAN) injection 4 mg  4 mg Intravenous Q12H PRN Suzzanne Cloud, MD   4 mg at 10/19/22 1249    oxyCODONE (ROXICODONE) immediate release tablet 10 mg  10 mg Oral BID PRN Kateri Plummer, MD   10 mg at 10/19/22 1536    pantoprazole (Protonix) injection 40 mg  40 mg Intravenous BID Rocco Serene, MD   40 mg at 10/19/22 1201    Parenteral Nutrition (CENTRAL)   Intravenous Continuous Suzzanne Cloud, MD 45 mL/hr at 10/19/22 0000 Rate Verify at 10/19/22 0000    phenol (CHLORASEPTIC) 1.4 % spray 2 spray  2 spray Mucous Membrane Q2H PRN Rocco Serene, MD   2 spray at 10/03/22 0138    polyethylene glycol (MIRALAX) packet 17 g  17 g Oral Daily PRN Suzzanne Cloud, MD        polyethylene glycol [facility]) packet 17 g  17 g Oral Daily Suzzanne Cloud, MD   17 g at 10/19/22 1206    promethazine (PHENERGAN) 6.5 mg in sodium chloride (NS) 0.9 % 50 mL IVPB  6.5 mg Intravenous Q6H PRN Suzzanne Cloud, MD 171 mL/hr at 10/19/22 1552 6.5 mg at 10/19/22 1552    sodium chloride (NS) 0.9 % infusion  10 mL/hr Intravenous Continuous Nat Christen., MD        sodium chloride 0.45% (1/2 NS) infusion  100 mL/hr Intravenous Continuous [name], MD 100 mL/hr at 10/19/22 0000 100 mL/hr at 10/19/22 0000    zinc oxide-cod liver oil (DESITIN 40%) Paste   Topical Daily PRN Arman Filter, MD   Given at 10/13/22 1015      Medications Prior to Admission   Medication Sig Dispense Refill Last Dose    [EXPIRED] buprenorphine-naloxone (SUBOXONE) 2-0.5 mg sublingual film Place 0.25 Film (0.5 mg of buprenorphine total) under the tongue three (3) times a day. Fill on or after: 09/05/22 22 Film 0 09/25/2022    LORazepam (ATIVAN) 0.5 MG tablet Take 1 tablet (0.5 mg total) by mouth Three (3) times a day as needed for anxiety. 20 tablet 0 Unknown    promethazine (PHENERGAN) 12.5 MG tablet Take 1 tablet (12.5 mg total) by mouth every six (6) hours as needed. 20 tablet 0 Unknown    acetaminophen (TYLENOL) 500 MG tablet Take 2 tablets (1,000 mg total) by mouth every eight (8) hours.       baclofen (LIORESAL) 5 mg Tab tablet Take 1 tablet (5 mg total) by mouth Three (3) times a day as needed for muscle spasms. 30 tablet 0     cefdinir (OMNICEF) 300 MG capsule Take 1 capsule (300mg ) twice a day on Mon, Wed, Friday       cholecalciferol, vitamin D3 25 mcg, 1,000 units,, 1,000 unit (25 mcg) tablet Take 1 tablet (25 mcg total) by mouth daily. 100 tablet 3  clobetasoL (TEMOVATE) 0.05 % ointment Apply topically two (2) times a day. To stubborn/thick rashes on hands/feet ears until clear/smooth. Restart as needed 60 g 3     diclofenac sodium (VOLTAREN) 1 % gel Apply 2 g topically four (4) times a day as needed for pain.       doxycycline (VIBRA-TABS) 100 MG tablet Take 1 tablets by mouth twice a day on Tuesday, Thursday, Saturday, Sunday. 28 tablet 0     famotidine (PEPCID) 20 MG tablet Take 1 tablet (20 mg total) by mouth nightly. 90 tablet 3     hydrocortisone (ANUSOL-HC) 2.5 % rectal cream Insert into the rectum two (2) times a day as needed.       lidocaine 4 % patch Place 2 patches on the skin daily for 7 days. Apply to affected area for 12 hours, then remove for 12 hours. 15 patch 0     LORazepam (ATIVAN) 0.5 MG tablet Take 1 tablet (0.5 mg total) by mouth Three (3) times a day as needed for anxiety. 270 tablet 2     mirtazapine (REMERON) 15 MG tablet Take  1 tablet by mouth (15mg ) at night with 7.5mg  tablet to make 22.5mg  nightly 30 tablet 0     naloxone (NARCAN) 4 mg nasal spray One spray in either nostril once for known/suspected opioid overdose. May repeat every 2-3 minutes in alternating nostril til EMS arrives 2 each 0     nirogacestat (OGSIVEO) 50 mg tablet Take 3 tablets (150 mg total) by mouth two (2) times a day. Swallow tablets whole; do not break, crush, or chew. 180 tablet 0     [EXPIRED] ondansetron (ZOFRAN-ODT) 4 MG disintegrating tablet Dissolve 1 tablet (4 mg total) by mouth every six (6) hours as needed. 20 tablet 0     oxyCODONE (ROXICODONE) 10 mg immediate release tablet Take 1 tablet (10 mg total) by mouth Three (3) times a day as needed for pain. Fill on or after: 09/08/22 90 tablet 0     pimecrolimus (ELIDEL) 1 % cream Apply topically two (2) times a day as needed. To face as needed for rash       triamcinolone (KENALOG) 0.1 % cream Apply topically two (2) times a day as needed. Apply to rash as needed       zinc oxide 10 % Crea Apply 1 application. topically daily as needed.          Past Medical History:   Diagnosis Date    Abdominal pain     Acid reflux     occas    Anesthesia complication     itching, shaking, coldness; last few surgeries have gone much better    Cancer (CMS-HCC)     Cataract of right eye     COVID-19 virus infection 01/2019    Cyst of thyroid determined by ultrasound     monitoring    [name]     2 right forearm, 1 left thigh, 1 right scapula, 1 under left clavicle; multiple    Difficult intravenous access     [name] (familial adenomatous polyposis)     [name]     Gastric polyps     History of chemotherapy     last treatment approx 05/2019    History of colon polyps     History of COVID-19 01/2019    Iron deficiency anemia due to chronic blood loss     received iron infusion 11-2019    PONV (postoperative nausea and  vomiting)     Rectal bleeding     Syncopal episodes     especially if becoming dehydrated       Past Surgical History:   Procedure Laterality Date    COLON SURGERY      cyroablation      cystis removal      desmoid removal      PR CLOSE ENTEROSTOMY,RESEC+ANAST N/A 10/09/2020    Procedure: ILEOSTOMY TAKEDOWN;  Surgeon: Mickle Asper, MD;  Location: OR [place];  Service: General Surgery    PR COLONOSCOPY W/BIOPSY SINGLE/MULTIPLE N/A 10/27/2012    Procedure: COLONOSCOPY, FLEXIBLE, PROXIMAL TO SPLENIC FLEXURE; WITH BIOPSY, SINGLE OR MULTIPLE;  Surgeon: Shirlyn Goltz Mir, MD;  Location: PEDS PROCEDURE ROOM [facility];  Service: Gastroenterology    PR COLONOSCOPY W/BIOPSY SINGLE/MULTIPLE N/A 09/14/2013    Procedure: COLONOSCOPY, FLEXIBLE, PROXIMAL TO SPLENIC FLEXURE; WITH BIOPSY, SINGLE OR MULTIPLE;  Surgeon: Shirlyn Goltz Mir, MD;  Location: PEDS PROCEDURE ROOM [place];  Service: Gastroenterology    PR COLONOSCOPY W/BIOPSY SINGLE/MULTIPLE N/A 11/08/2014    Procedure: COLONOSCOPY, FLEXIBLE, PROXIMAL TO SPLENIC FLEXURE; WITH BIOPSY, SINGLE OR MULTIPLE;  Surgeon: Arnold Long Mir, MD;  Location: PEDS PROCEDURE ROOM [facility];  Service: Gastroenterology    PR COLONOSCOPY W/BIOPSY SINGLE/MULTIPLE N/A 12/26/2015    Procedure: COLONOSCOPY, FLEXIBLE, PROXIMAL TO SPLENIC FLEXURE; WITH BIOPSY, SINGLE OR MULTIPLE;  Surgeon: Arnold Long Mir, MD;  Location: PEDS PROCEDURE ROOM [facility];  Service: Gastroenterology    PR COLONOSCOPY W/BIOPSY SINGLE/MULTIPLE N/A 09/02/2017    Procedure: COLONOSCOPY, FLEXIBLE, PROXIMAL TO SPLENIC FLEXURE; WITH BIOPSY, SINGLE OR MULTIPLE;  Surgeon: Arnold Long Mir, MD;  Location: PEDS PROCEDURE ROOM ;  Service: Gastroenterology    PR COLSC FLX W/REMOVAL LESION BY HOT BX FORCEPS N/A 08/27/2016    Procedure: COLONOSCOPY, FLEXIBLE, PROXIMAL TO SPLENIC FLEXURE; W/REMOVAL TUMOR/POLYP/OTHER LESION, HOT BX FORCEP/CAUTE;  Surgeon: Arnold Long Mir, MD;  Location: PEDS PROCEDURE ROOM [facility];  Service: Gastroenterology    PR COLSC FLX W/RMVL OF TUMOR POLYP LESION SNARE TQ N/A 02/25/2019    Procedure: COLONOSCOPY FLEX; W/REMOV TUMOR/LES BY SNARE;  Surgeon: Helyn Numbers, MD;  Location: [facility] [place] [facility];  Service: Gastroenterology    PR COLSC FLX W/RMVL OF TUMOR POLYP LESION SNARE TQ N/A 03/13/2020    Procedure: COLONOSCOPY FLEX; W/REMOV TUMOR/LES BY SNARE;  Surgeon: Helyn Numbers, MD;  [facility]: [facility] [place] [facility];  Service: Gastroenterology    PR EXC SKIN BENIG 2.1-3 CM TRUNK,ARM,LEG Right 02/25/2017    Procedure: EXCISION, BENIGN LESION INCLUDE MARGINS, EXCEPT SKIN TAG, LEGS; EXCISED DIAMETER 2.1 TO 3.0 CM;  Surgeon: Clarene Duke, MD;  Location: CHILDRENS OR [facility];  Service: Plastics    PR EXC SKIN BENIG 3.1-4 CM TRUNK,ARM,LEG Right 02/25/2017    Procedure: EXCISION, BENIGN LESION INCLUDE MARGINS, EXCEPT SKIN TAG, ARMS; EXCISED DIAMETER 3.1 TO 4.0 CM;  Surgeon: Clarene Duke, MD;  Location: CHILDRENS OR [facility];  Service: Plastics    PR EXC SKIN BENIG >4 CM FACE,FACIAL Right 02/25/2017    Procedure: EXCISION, OTHER BENIGN LES INCLUD MARGIN, FACE/EARS/EYELIDS/NOSE/LIPS/MUCOUS MEMBRANE; EXCISED DIAM >4.0 CM;  Surgeon: Clarene Duke, MD;  Location: CHILDRENS OR [facility];  Service: Plastics    PR EXC TUMOR SOFT TISSUE LEG/ANKLE SUBQ 3+CM Right 08/05/2019    Procedure: EXCISION, TUMOR, SOFT TISSUE OF LEG OR ANKLE AREA, SUBCUTANEOUS; 3 CM OR GREATER;  Surgeon: Arsenio Katz, MD;  Location: MAIN OR [facility];  Service: Plastics    PR EXC TUMOR SOFT TISSUE LEG/ANKLE SUBQ <3CM Right 08/05/2019  Procedure: EXCISION, TUMOR, SOFT TISSUE OF LEG OR ANKLE AREA, SUBCUTANEOUS; LESS THAN 3 CM;  Surgeon: Arsenio Katz, MD;  Location: MAIN OR [facility];  Service: Plastics    PR LAP, SURG PROCTECTOMY W J-POUCH N/A 08/10/2020    Procedure: ROBOTIC ASSISTED LAPAROSCOPIC PROCTOCOLECTOMY, ILEAL J POUCH, WITH OSTOMY;  Surgeon: Mickle Asper, MD;  Location: OR [place];  Service: General Surgery    PR NDSC EVAL INTSTINAL POUCH DX W/COLLJ SPEC SPX N/A 01/23/2021    Procedure: ENDO EVAL SM INTEST POUCH; DX;  Surgeon: Modena Nunnery, MD;  Location: [facility]URES [place] [facility];  Service: Gastroenterology    PR NDSC EVAL INTSTINAL POUCH DX W/COLLJ SPEC SPX N/A 08/27/2021    Procedure: ENDO EVAL SM INTEST POUCH; DX;  Surgeon: Hunt Oris, MD;  Location: [facility] PROCEDURES [facility];  Service: Gastroenterology    PR NDSC EVAL INTSTINAL POUCH DX W/COLLJ SPEC SPX N/A 12/09/2021    Procedure: ENDO EVAL SM INTEST POUCH; DX;  Surgeon: Vidal Schwalbe, MD;  Location: [facility] PROC[facility]URES [facility];  Service: Gastroenterology    PR NDSC EVAL INTSTINAL POUCH DX W/COLLJ [facility] SPX Left 04/09/2022    Procedure: ENDO EVAL SM INTEST POUCH; DX;  Surgeon: Modena Nunnery, MD;  Location: [facility] [place] [facility];  Service: Gastroenterology    PR NDSC EVAL INTSTINAL POUCH DX W/COLLJ SPEC SPX N/A 08/05/2022    Procedure: ENDO EVAL SM INTEST POUCH; DX;  Surgeon: Modena Nunnery, MD;  [facility]: [facility]URES [facility];  Service: Gastroenterology    PR NDSC EVAL INTSTINAL POUCH W/BX SINGLE/MULTIPLE N/A 01/20/2022    Procedure: ENDOSCOPIC EVAL OF SMALL INTESTINAL POUCH; DIAGNOSTIC, No biopsies;  Surgeon: Andrey Farmer, MD;  Location: GI PROCEDURES [facility];  Service: Gastroenterology    PR NDSC EVAL INTSTINAL POUCH W/BX SINGLE/MULTIPLE N/A 02/13/2022    Procedure: ENDOSCOPIC EVAL OF SMALL INTESTINAL POUCH; DIAGNOSTIC, WITH BIOPSY;  Surgeon: Bronson Curb, MD;  Location: GI PROCEDURES [facility];  Service: Gastroenterology    PR UNLISTED PROCEDURE SMALL INTESTINE  01/23/2021    Procedure: UNLISTED PROCEDURE, SMALL INTESTINE;  Surgeon: Modena Nunnery, MD;  [facility]: [facility] [place] [facility];  Service: Gastroenterology    PR UNLISTED PROCEDURE SMALL INTESTINE  02/13/2022    Procedure: UNLISTED PROCEDURE, SMALL INTESTINE;  Surgeon: Bronson Curb, MD;  Location: GI PROCEDURES [facility];  Service: Gastroenterology    PR UPPER GI ENDOSCOPY,BIOPSY N/A 10/27/2012    Procedure: UGI ENDOSCOPY; WITH BIOPSY, SINGLE OR MULTIPLE;  Surgeon: Shirlyn Goltz Mir, MD;  Location: PEDS PROCEDURE ROOM [facility];  Service: Gastroenterology    PR UPPER GI ENDOSCOPY,BIOPSY N/A 09/14/2013    Procedure: UGI ENDOSCOPY; WITH BIOPSY, SINGLE OR MULTIPLE;  Surgeon: Shirlyn Goltz Mir, MD;  Location: PEDS PROCEDURE ROOM [facility];  Service: Gastroenterology    PR UPPER GI ENDOSCOPY,BIOPSY N/A 11/08/2014    Procedure: UGI ENDOSCOPY; WITH BIOPSY, SINGLE OR MULTIPLE;  Surgeon: Arnold Long Mir, MD;  Location: PEDS PROCEDURE ROOM [facility];  Service: Gastroenterology    PR UPPER GI ENDOSCOPY,BIOPSY N/A 12/26/2015    Procedure: UGI ENDOSCOPY; WITH BIOPSY, SINGLE OR MULTIPLE;  Surgeon: Arnold Long Mir, MD;  Location: PEDS PROCEDURE ROOM [facility];  Service: Gastroenterology    PR UPPER GI ENDOSCOPY,BIOPSY N/A 08/27/2016    Procedure: UGI ENDOSCOPY; WITH BIOPSY, SINGLE OR MULTIPLE;  Surgeon: Arnold Long Mir, MD;  Location: PEDS PROCEDURE ROOM [facility];  Service: Gastroenterology    PR UPPER GI ENDOSCOPY,BIOPSY N/A 09/02/2017    Procedure: UGI ENDOSCOPY; WITH BIOPSY,  SINGLE OR MULTIPLE;  Surgeon: Arnold Long Mir, MD;  Location: PEDS PROCEDURE ROOM [facility];  Service: Gastroenterology    PR UPPER GI ENDOSCOPY,BIOPSY N/A 03/13/2020    Procedure: UGI ENDOSCOPY; WITH BIOPSY, SINGLE OR MULTIPLE;  Surgeon: Helyn Numbers, MD;  Location: [facility] [place] [facility];  Service: Gastroenterology    PR UPPER GI ENDOSCOPY,BIOPSY N/A 09/05/2021    Procedure: UGI ENDOSCOPY; WITH BIOPSY, SINGLE OR MULTIPLE;  Surgeon: Wendall Papa, MD;  Location: GI PROC[facility]URES [facility];  Service: Gastroenterology    PR UPPER GI ENDOSCOPY,DIAGNOSIS N/A 01/20/2022    Procedure: UGI ENDO, INCLUDE ESOPHAGUS, STOMACH, & DUODENUM &/OR JEJUNUM; DX W/WO COLLECTION SPECIMN, BY BRUSH OR WASH;  Surgeon: Andrey Farmer, MD;  Location: GI PROCEDURES [facility];  Service: Gastroenterology    TUMOR REMOVAL      multiple-head, neck, back, hand, right flank, multiple       Social History     Socioeconomic History    Marital status: Single     Spouse name: None    Number of children: None    Years of education: None    Highest education level: None   Tobacco Use    Smoking status: Never     Passive exposure: Past    Smokeless tobacco: Never   Vaping Use    Vaping status: Never Used   Substance and Sexual Activity    Alcohol use: Never    Drug use: Never    Sexual activity: Never   Other Topics Concern    Do you use sunscreen? Yes    Tanning bed use? No    Are you easily burned? No    Excessive sun exposure? No    Blistering sunburns? 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     Social Determinants of Health     Financial Resource Strain: Low Risk  (09/26/2022)    Overall Financial Resource Strain (CARDIA)     Difficulty of Paying Living Expenses: Not very hard   Food Insecurity: No Food Insecurity (09/26/2022)    Hunger Vital Sign     Worried About Running Out of Food in the Last Year: Never true     Ran Out of Food in the Last Year: Never true   Transportation Needs: No Transportation Needs (09/26/2022)    PRAPARE - Therapist, art (Medical): No     Lack of Transportation (Non-Medical): No     Family History   Problem Relation Age of Onset    No Known Problems Mother     No Known Problems Father     No Known Problems Sister     No Known Problems Brother     Stroke Maternal Grandmother     Other Maternal Grandmother benign lesions of liver and pancreas, further details unknown    Cancer Maternal Grandmother     Diabetes Maternal Grandmother     Hypertension Maternal Grandmother     Thyroid disease Maternal Grandmother     Arthritis Maternal Grandfather     Asthma Maternal Grandfather     COPD Paternal Grandmother         Deceased    Miscarriages / Stillbirths Paternal Grandmother     Alcohol abuse Paternal Grandfather         Deceased    No Known Problems Maternal Aunt     No  Known Problems Maternal Uncle     No Known Problems Paternal Aunt     No Known Problems Paternal Uncle     Anesthesia problems Neg Hx     Broken bones Neg Hx     Cancer Neg Hx     Clotting disorder Neg Hx     Collagen disease Neg Hx     Diabetes Neg Hx     Dislocations Neg Hx     Fibromyalgia Neg Hx     Gout Neg Hx     Hemophilia Neg Hx     Osteoporosis Neg Hx     Rheumatologic disease Neg Hx     Scoliosis Neg Hx     Severe sprains Neg Hx     Sickle cell anemia Neg Hx     Spinal Compression Fracture Neg Hx     Melanoma Neg Hx     Basal cell carcinoma Neg Hx     Squamous cell carcinoma Neg Hx        Code Status: Full Code     Review of Systems     A 10-system review of systems was conducted and was negative except as documented above in the HPI.       Objective        Temp:  [36.5 ??C (97.7 ??F)-36.8 ??C (98.3 ??F)] 36.5 ??C (97.7 ??F)  Heart Rate:  [63-96] 96  SpO2 Pulse:  [63-96] 96  Resp:  [12-17] 17  BP: (94-126)/(48-68) 121/60  MAP (mmHg):  [61-84] 78  SpO2:  [97 %-100 %] 99 %  I/O this shift:  In: 766.7 [I.V.:766.7]  Out: 1500 [Urine:1500]    Physical Exam:  General Appearance:Chronically ill appearing. In no acute distress.  HEENT: Head is atraumatic and normocephalic. Sclera anicteric without injection. Oropharyngeal membranes are moist with no erythema or exudate.  Neck: Deferred.  Lungs:  Normal work of breathing  Heart:  Tachycardiac  Abdomen: Nondistended.  Extremities: No clubbing, cyanosis, or edema.    Neurological Examination:     Mental Status: Alert, conversant, able to follow conversation and interview. Spontaneous speech was fluent without word finding pauses, dysarthria, or paraphasic errors. Comprehension was intact to simple and multi-step commands. Memory for recent and remote events was intact.    Cranial Nerves: Visual fields intact to direct confrontation.  PERRL.Pursuit eye movements were uninterrupted with full range and without more than end-gaze nystagmus. Facial sensation intact bilaterally to light touch on the forehead, cheek, and chin. Face symmetric at rest. Normal facial movement bilaterally, including forehead, eye closure and grimace/smile. Hearing intact to conversation. Shoulder shrug full strength bilaterally. Palate movement is symmetric. Tongue protrudes midline and tongue movements are normal.    Motor Exam: Normal bulk.  RUE tone on the Modified Ashworth Scale: 1 (slight increase in muscle tone, manifested by a catch and release or by minimal resistance at the end of the range of motion when the affect part is moved in flexion or extension).  LUE tone on the Modified Ashworth Scale: 1 (slight increase in muscle tone, manifested by a catch and release or by minimal resistance at the end of the range of motion when the affect part is moved in flexion or extension).  RLE tone on the Modified Ashworth Scale: 3 (considerable increase in muscle tone passive, movement difficult).  LLE tone on the Modified Ashworth Scale: 3 (considerable increase in muscle tone passive, movement difficult).  No tremors, myoclonus, or other adventitious movement.  Pronator drift is absent.  UE R/L: deltoid 4/4+, biceps 4/4+, triceps 4/4+, wrist extension 4+/5, finger spread 4+/5, and APB (median nerve) 4/5.  LE R/L: hip flexion 3/3, quadriceps 4-/4-, hamstrings 3/3, dorsiflexion 3/3, and plantar flexion 3/3.      Reflexes:   R L   Biceps +2 +2   Brachioradialis +2 +2   Triceps +2 +2   Patella +2 +2   Achilles +2 +2   Toes are downgoing bilaterally. No Hoffman's present bilaterally.    Sensory: Sensation normal to light touch and temperature sensation to cold in both hands and both feet and to vibration distally in the fingers and toes. Except diminished temperature sensation over R ulnar distribution below elbow and mild decreased vibratory sensation in bilateral toes.    Cerebellar/Coordination/Gait: Finger-to-nose is normal without ataxia or dysmetria bilaterally.          Motor Exam: Previously: Normal bulk. Normal tone in bilateral upper extremities. Increased tone in bilateral lower extremities without spasticity. Worse at ankles bilaterally than the knees and more normal at the hips.  RLE tone on the Modified Ashworth Scale: 2 (more marked increase in muscle tone through most of the ROM, but affected part easily moved).  LLE tone on the Modified Ashworth Scale: 3 (considerable increase in muscle tone passive, movement difficult).  Some give-way weakness throughout.  UE R/L: biceps 5/5, triceps 5/5, interossei 4+/4+, and hand grip mildly weak/strong   LE R/L: hip flexion 5-/5-, quadriceps 5-/5-, hamstrings 5-/5-, dorsiflexion 5/5, and plantar flexion 5/5.      Reflexes: Previously  R/L: biceps 2+/2+, brachioradialis 2+/2+, and patella 2+/3+ with 3-5 beats of clonus. Unable to obtain ankle reflexes given plantarflexion with limited ROM, no clear clonus within this limitation. No crossed adductors.      Sensory:  Intact to light touch     Cerebellar/Coordination/Gait: Finger-to-nose is normal without ataxia or dysmetria bilaterally.      Diagnostic Studies      All Labs Last 24hrs:   Recent Results (from the past 24 hour(s))   Magnesium Level    Collection Time: 10/19/22  6:35 AM   Result Value Ref Range    Magnesium 2.1 1.6 - 2.6 mg/dL   Phosphorus Level    Collection Time: 10/19/22  6:35 AM   Result Value Ref Range    Phosphorus 3.2 2.4 - 5.1 mg/dL   Type and Screen    Collection Time: 10/19/22  6:35 AM   Result Value Ref Range    ABO Grouping AB POS     Antibody Screen NEG    Basic Metabolic Panel    Collection Time: 10/19/22  6:35 AM   Result Value Ref Range    Sodium 142 135 - 145 mmol/L    Potassium 3.9 3.4 - 4.8 mmol/L    Chloride 111 (H) 98 - 107 mmol/L    CO2 25.0 20.0 - 31.0 mmol/L    Anion Gap 6 5 - 14 mmol/L    BUN 12 9 - 23 mg/dL    Creatinine 1.61 (L) 0.55 - 1.02 mg/dL    BUN/Creatinine Ratio 28     eGFR CKD-EPI (2021) Female >90 >=60 mL/min/1.62m2    Glucose 118 70 - 179 mg/dL    Calcium 9.2 8.7 - 09.6 mg/dL   ECG 12 Lead    Collection Time: 10/19/22 11:22 AM   Result Value Ref Range    EKG Systolic BP  mmHg    EKG Diastolic BP  mmHg    EKG Ventricular Rate  85 BPM    EKG Atrial Rate  BPM    EKG P-R Interval  ms    EKG QRS Duration 180 ms    EKG Q-T Interval 456 ms    EKG QTC Calculation 542 ms    EKG Calculated P Axis  degrees    EKG Calculated R Axis -32 degrees    EKG Calculated T Axis -76 degrees    QTC Fredericia 512 ms       MRI [facility] Spine w/wo, 09/26/2022, personally reviewed:  Mild spinal canal stenosis noted at C4-C6. No abnormal enhancement.

## 2022-10-19 NOTE — Unmapped (Signed)
Problem: Adult Inpatient Plan of Care  Goal: Plan of Care Review  Outcome: Ongoing - Unchanged  Goal: Patient-Specific Goal (Individualized)  Outcome: Ongoing - Unchanged  Goal: Absence of Hospital-Acquired Illness or Injury  Outcome: Ongoing - Unchanged  Intervention: Identify and Manage Fall Risk  Recent Flowsheet Documentation  Taken 10/18/2022 2000 by Tristan Schroeder, RN  Safety Interventions:   aspiration precautions   bed alarm   environmental modification   fall reduction program maintained   lighting adjusted for tasks/safety   low bed   muscle strengthening facilitated   nonskid shoes/slippers when out of bed  Intervention: Prevent Skin Injury  Recent Flowsheet Documentation  Taken 10/18/2022 2000 by Tristan Schroeder, RN  Positioning for Skin: Left  Device Skin Pressure Protection: absorbent pad utilized/changed  Skin Protection: incontinence pads utilized  Intervention: Prevent Infection  Recent Flowsheet Documentation  Taken 10/18/2022 2000 by Tristan Schroeder, RN  Infection Prevention:   environmental surveillance performed   hand hygiene promoted   rest/sleep promoted   single patient room provided  Goal: Optimal Comfort and Wellbeing  Outcome: Ongoing - Unchanged  Goal: Readiness for Transition of Care  Outcome: Ongoing - Unchanged  Goal: Rounds/Family Conference  Outcome: Ongoing - Unchanged     Problem: Latex Allergy  Goal: Absence of Allergy Symptoms  Outcome: Ongoing - Unchanged     Problem: Fall Injury Risk  Goal: Absence of Fall and Fall-Related Injury  Outcome: Ongoing - Unchanged  Intervention: Promote Injury-Free Environment  Recent Flowsheet Documentation  Taken 10/18/2022 2000 by Tristan Schroeder, RN  Safety Interventions:   aspiration precautions   bed alarm   environmental modification   fall reduction program maintained   lighting adjusted for tasks/safety   low bed   muscle strengthening facilitated   nonskid shoes/slippers when out of bed     Problem: Skin Injury Risk Increased  Goal: Skin Health and Integrity  Outcome: Ongoing - Unchanged  Intervention: Optimize Skin Protection  Recent Flowsheet Documentation  Taken 10/19/2022 0000 by Tristan Schroeder, RN  Head of Bed [facility]) Positioning: HOB at 30 degrees  Taken 10/18/2022 2000 by Tristan Schroeder, RN  Pressure Reduction Techniques:   frequent weight shift encouraged   heels elevated off bed  Head of Bed (HOB) Positioning: HOB at 30 degrees  Pressure Reduction Devices:   specialty bed utilized   pressure-redistributing mattress utilized  Skin Protection: incontinence pads utilized     Problem: Self-Care Deficit  Goal: Improved Ability to Complete Activities of Daily Living  Outcome: Ongoing - Unchanged     Problem: Comorbidity Management  Goal: Blood Glucose Levels Within Targeted Range  Outcome: Ongoing - Unchanged  Goal: Blood Pressure in Desired Range  Outcome: Ongoing - Unchanged     Problem: Nausea and Vomiting  Goal: Nausea and Vomiting Relief  Outcome: Ongoing - Unchanged

## 2022-10-20 LAB — CBC
HEMATOCRIT: 27.8 % — ABNORMAL LOW (ref 34.0–44.0)
HEMATOCRIT: 29.9 % — ABNORMAL LOW (ref 34.0–44.0)
HEMOGLOBIN: 9.1 g/dL — ABNORMAL LOW (ref 11.3–14.9)
HEMOGLOBIN: 9.9 g/dL — ABNORMAL LOW (ref 11.3–14.9)
MEAN CORPUSCULAR HEMOGLOBIN CONC: 32.7 g/dL (ref 32.0–36.0)
MEAN CORPUSCULAR HEMOGLOBIN CONC: 33.2 g/dL (ref 32.0–36.0)
MEAN CORPUSCULAR HEMOGLOBIN: 26.3 pg (ref 25.9–32.4)
MEAN CORPUSCULAR HEMOGLOBIN: 26.5 pg (ref 25.9–32.4)
MEAN CORPUSCULAR VOLUME: 80.5 fL (ref 77.6–95.7)
MEAN CORPUSCULAR VOLUME: 79.7 fL (ref 77.6–95.7)
MEAN PLATELET VOLUME: 10.8 fL — ABNORMAL HIGH (ref 6.8–10.7)
MEAN PLATELET VOLUME: 10.6 fL (ref 6.8–10.7)
PLATELET COUNT: 212 10*9/L (ref 150–450)
PLATELET COUNT: 209 10*9/L (ref 150–450)
RED BLOOD CELL COUNT: 3.45 10*12/L — ABNORMAL LOW (ref 3.95–5.13)
RED BLOOD CELL COUNT: 3.76 10*12/L — ABNORMAL LOW (ref 3.95–5.13)
RED CELL DISTRIBUTION WIDTH: 14.3 % (ref 12.2–15.2)
RED CELL DISTRIBUTION WIDTH: 13.9 % (ref 12.2–15.2)
WBC ADJUSTED: 9.7 10*9/L (ref 3.6–11.2)
WBC ADJUSTED: 9.5 10*9/L (ref 3.6–11.2)

## 2022-10-20 LAB — COMPREHENSIVE METABOLIC PANEL
ALBUMIN: 3.5 g/dL (ref 3.4–5.0)
ALKALINE PHOSPHATASE: 101 U/L (ref 46–116)
ALT (SGPT): 349 U/L — ABNORMAL HIGH (ref 10–49)
ANION GAP: 5 mmol/L (ref 5–14)
AST (SGOT): 170 U/L — ABNORMAL HIGH (ref <=34)
BILIRUBIN TOTAL: 0.2 mg/dL — ABNORMAL LOW (ref 0.3–1.2)
BLOOD UREA NITROGEN: 13 mg/dL (ref 9–23)
BUN / CREAT RATIO: 29
CALCIUM: 9.4 mg/dL (ref 8.7–10.4)
CHLORIDE: 105 mmol/L (ref 98–107)
CO2: 28 mmol/L (ref 20.0–31.0)
CREATININE: 0.45 mg/dL — ABNORMAL LOW
EGFR CKD-EPI (2021) FEMALE: >90 mL/min/{1.73_m2} (ref >=60)
GLUCOSE RANDOM: 94 mg/dL (ref 70–179)
POTASSIUM: 4.3 mmol/L (ref 3.4–4.8)
PROTEIN TOTAL: 6.7 g/dL (ref 5.7–8.2)
SODIUM: 138 mmol/L (ref 135–145)

## 2022-10-20 LAB — BASIC METABOLIC PANEL
ANION GAP: 5 mmol/L (ref 5–14)
BLOOD UREA NITROGEN: 11 mg/dL (ref 9–23)
BUN / CREAT RATIO: 27
CALCIUM: 8.8 mg/dL (ref 8.7–10.4)
CHLORIDE: 106 mmol/L (ref 98–107)
CO2: 29 mmol/L (ref 20.0–31.0)
CREATININE: 0.41 mg/dL — ABNORMAL LOW
EGFR CKD-EPI (2021) FEMALE: >90 mL/min/{1.73_m2} (ref >=60)
GLUCOSE RANDOM: 96 mg/dL (ref 70–179)
POTASSIUM: 3.7 mmol/L (ref 3.4–4.8)
SODIUM: 140 mmol/L (ref 135–145)

## 2022-10-20 LAB — HEPATIC FUNCTION PANEL
ALBUMIN: 3.3 g/dL — ABNORMAL LOW (ref 3.4–5.0)
ALKALINE PHOSPHATASE: 102 U/L (ref 46–116)
ALT (SGPT): 430 U/L — ABNORMAL HIGH (ref 10–49)
AST (SGOT): 420 U/L — ABNORMAL HIGH (ref <=34)
BILIRUBIN DIRECT: <0.1 mg/dL (ref 0.00–0.30)
BILIRUBIN TOTAL: <0.2 mg/dL — ABNORMAL LOW (ref 0.3–1.2)
PROTEIN TOTAL: 5.7 g/dL (ref 5.7–8.2)

## 2022-10-20 LAB — MAGNESIUM: MAGNESIUM: 1.9 mg/dL (ref 1.6–2.6)

## 2022-10-20 LAB — CK: CREATINE KINASE TOTAL: <15 U/L — ABNORMAL LOW

## 2022-10-20 LAB — TRIGLYCERIDES: TRIGLYCERIDES: 128 mg/dL (ref 0–150)

## 2022-10-20 LAB — PHOSPHORUS: PHOSPHORUS: 5 mg/dL (ref 2.4–5.1)

## 2022-10-20 LAB — LACTATE, VENOUS, WHOLE BLOOD: LACTATE BLOOD VENOUS: 0.8 mmol/L (ref 0.5–1.8)

## 2022-10-20 LAB — LIPASE: LIPASE: 57 U/L — ABNORMAL HIGH (ref 12–53)

## 2022-10-20 MED ADMIN — baclofen (LIORESAL) tablet 15 mg: 15 mg | ORAL | @ 12:00:00

## 2022-10-20 MED ADMIN — buprenorphine-naloxone (SUBOXONE) 2-0.5 mg SL film 0.5 mg of buprenorphine: .25 | SUBLINGUAL | @ 01:00:00

## 2022-10-20 MED ADMIN — Parenteral Nutrition (CENTRAL): INTRAVENOUS | @ 03:00:00 | Stop: 2022-10-20

## 2022-10-20 MED ADMIN — naloxegol (MOVANTIK) tablet 25 mg: 25 mg | ORAL | @ 12:00:00

## 2022-10-20 MED ADMIN — buprenorphine-naloxone (SUBOXONE) 2-0.5 mg SL film 0.5 mg of buprenorphine: .25 | SUBLINGUAL | @ 20:00:00

## 2022-10-20 MED ADMIN — promethazine (PHENERGAN) 6.5 mg in sodium chloride (NS) 0.9 % 50 mL IVPB: 6.5 mg | INTRAVENOUS | @ 23:00:00

## 2022-10-20 MED ADMIN — cetirizine (ZYRTEC) tablet 20 mg: 20 mg | GASTROENTERAL | @ 01:00:00

## 2022-10-20 MED ADMIN — cetirizine (ZYRTEC) tablet 20 mg: 20 mg | GASTROENTERAL | @ 12:00:00

## 2022-10-20 MED ADMIN — baclofen (LIORESAL) tablet 15 mg: 15 mg | ORAL | @ 20:00:00

## 2022-10-20 MED ADMIN — oxyCODONE (ROXICODONE) immediate release tablet 10 mg: 10 mg | ORAL | @ 03:00:00 | Stop: 2022-10-31

## 2022-10-20 MED ADMIN — pantoprazole (Protonix) injection 40 mg: 40 mg | INTRAVENOUS | @ 04:00:00

## 2022-10-20 MED ADMIN — mirtazapine (REMERON) tablet 15 mg: 15 mg | GASTROENTERAL | @ 03:00:00

## 2022-10-20 MED ADMIN — buprenorphine-naloxone (SUBOXONE) 2-0.5 mg SL film 0.5 mg of buprenorphine: .25 | SUBLINGUAL | @ 12:00:00

## 2022-10-20 MED ADMIN — oxyCODONE (ROXICODONE) immediate release tablet 10 mg: 10 mg | ORAL | @ 12:00:00 | Stop: 2022-10-20

## 2022-10-20 MED ADMIN — HYDROmorphone (PF) injection Syrg 0.5 mg: .5 mg | INTRAVENOUS | @ 21:00:00 | Stop: 2022-10-20

## 2022-10-20 MED ADMIN — famotidine (PF) (PEPCID) injection 20 mg: 20 mg | INTRAVENOUS | @ 04:00:00

## 2022-10-20 MED ADMIN — baclofen (LIORESAL) tablet 15 mg: 15 mg | ORAL | @ 01:00:00

## 2022-10-20 MED ADMIN — promethazine (PHENERGAN) 6.5 mg in sodium chloride (NS) 0.9 % 50 mL IVPB: 6.5 mg | INTRAVENOUS | @ 01:00:00

## 2022-10-20 MED ADMIN — fat emulsion 20 % with fish oil (SMOFlipid) infusion 250 mL: 250 mL | INTRAVENOUS | @ 03:00:00 | Stop: 2022-10-20

## 2022-10-20 MED ADMIN — nirogacestat (OGSIVEO) tablet 50 mg *Patient Supplied*: 50 mg | ORAL | @ 01:00:00

## 2022-10-20 MED ADMIN — HYDROmorphone (PF) injection Syrg 0.5 mg: .5 mg | INTRAVENOUS | @ 23:00:00 | Stop: 2022-10-20

## 2022-10-20 MED ADMIN — oxyCODONE (ROXICODONE) immediate release tablet 10 mg: 10 mg | ORAL | @ 21:00:00 | Stop: 2022-10-25

## 2022-10-20 MED ADMIN — promethazine (PHENERGAN) 6.5 mg in sodium chloride (NS) 0.9 % 50 mL IVPB: 6.5 mg | INTRAVENOUS | @ 11:00:00

## 2022-10-20 MED ADMIN — ondansetron (ZOFRAN) injection 4 mg: 4 mg | INTRAVENOUS | @ 12:00:00

## 2022-10-20 MED ADMIN — pantoprazole (Protonix) injection 40 mg: 40 mg | INTRAVENOUS | @ 12:00:00

## 2022-10-20 MED ADMIN — LORazepam (ATIVAN) tablet 0.5 mg: .5 mg | ORAL | @ 11:00:00

## 2022-10-20 MED ADMIN — sodium chloride 0.45% (1/2 NS) infusion: 100 mL/h | INTRAVENOUS | @ 08:00:00

## 2022-10-20 MED ADMIN — nirogacestat (OGSIVEO) tablet 50 mg *Patient Supplied*: 50 mg | ORAL | @ 12:00:00

## 2022-10-20 MED ADMIN — polyethylene glycol (MIRALAX) packet 17 g: 17 g | ORAL | @ 12:00:00 | Stop: 2022-10-20

## 2022-10-20 MED ADMIN — butalbital-acetaminophen-caffeine (ESGIC) per tablet 1 tablet: 1 | ORAL | @ 21:00:00 | Stop: 2022-10-20

## 2022-10-20 NOTE — Unmapped (Signed)
Hospital Medicine Daily Progress Note    Assessment/Plan:    Principal Problem:    Other acute postprocedural pain  Active Problems:    [name]    Intestinal polyps    [name]    Neoplasm related pain    Physical deconditioning    History of colectomy    Intractable abdominal pain    Generalized anxiety disorder with panic attacks    SBO (small bowel obstruction) (CMS-HCC)    Small intestinal bacterial overgrowth (SIBO)  Resolved Problems:    * No resolved hospital problems. 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    Muscle rigidity, increased tone and spasms   Occurred post-procedurally, with reassuring evaluation (MRI spine, neuro c/s, CK normal) at that time. Thought it was due to anesthesia (has had similar, though much more transient, episodes after anesthesia in the past). Increased tone remains quite severe and it has not improved - initial plan was to increase baclofen and monitor for improvement. Neurology re-engaged 8/11 and recommending further evaluation for a central process (MRI), nutritional deficiencies/excess levels. May also benefit from EMG but their team will discuss further today. Given her tumor syndrome, I do think it is important to make sure we are not missing something. This does not feel normal.  PLAN  - Appreciate chronic pain and PM&R  - Neurology re-engaged, anticipating further recommendations today  - Ordered MRI w/wo and nutritional studies per their recommendations yesterday evening  - Baclofen 15 mg TID; monitoring for side effects  - Continue ativan TID prn (PTA med), but caution against escalating    Elevated LFTs  Hepatocellular injury  AST/ALT 400s this morning. Mildly elevated a few days ago but were down trending to normal before today. Unclear etiology - unclear if this could be related to TPN. Will discuss with pharmacy. Already down-trending again this afternoon and no bili elevation. Mother also asked if it could be a side effect of her new chemotherapy, vs other med (DILI). Differential also includes AI process, congestion, clot.  PLAN  - Discuss with pharmacy re: possible drug induced liver injury  - Continue to monitor daily for now  - If not continuing to fall by tomorrow morning can discuss with hepatology    Chronic abdominal pain, nausea 2/2 desmoid tumors and prior surgeries  Ileus, multifactorial but with component of narcotic bowel - resolved  Inability to tolerate PO, without diagnosis of malnutrition - improving  Having small consistent bowel movements. Nausea is improved but certainly not resolved, and she is working on increasing the volume and textures of solid intake. I do not think she needs the NGT anymore but she is fearful of removing it. Feels like pills get stuck, this may improve once the NG tube is out.  PLAN  - Appreciate chronic pain recs  - SLP following, regular diet recommended  - Monitor Qtc every 2-3 days; may need to dc zofran  - NG tube clamped, will strongly encourage removal in the next 1-2 days  - Continue TPN for nutritional support, reorder daily  -  Continue bowel regimen with BID suppository, miralax daily  - Limit opiates as able    Peristalsis in RLQ  Pain in RLQ  Patient and family concerned. It is very pronounced (like a baby kicking/bag of worms). Pain/cramping is ongoing. She is eating small amounts, moving bowels, passing flatus. Vitals normal. She is concerned this could be an early sign of mechanical obstruction/partial obstruction.   PLAN  - Continue to monitor closely, no indication for further testing at this time  - If she develops any distension or worsening nausea, vomiting will obtain repeat labs/imaging (would start with xray to assess gas pattern, consider CT a/p with contrast to evaluate for a transition point)    Syncope and pre-syncope, uncertain etiology  Possibly vasovagal vs adverse effect of medications vs deconditioning vs iron deficiency. Has improved with weaning opioids but still occurring, last incident 8/6 (LOC per nurse, witnessed). Previously worked up for PE, with negative CTA. TTE normal. Cosyntropin stim test and thyroid studies normal. Remains in sinus without events (pauses) on telemetry.  PLAN  - Limit polypharmacy  - S/p IV iron    Iron deficiency anemia  Ferritin 5, iron saturation 6%. Has had prior reactions with numerous attempts to give IV iron.   - S/p Feraheme 1020 mg. Received pre-med protocol per A&I  - Would not give IV iron except when it cannot be avoided given severe itching/stress it causes her  - Continues to have itching but have switched to oral benadryl, please avoid IV    Headaches  Minimal improvement with IV fluids, magnesium, IV Benadryl. Concerning for rebound/medication overuse headache. Headache has improved with Tylenol holiday and ketamine though not completely resolved. Though her chemotherapy also has headaches as a common side effect.  PLAN  - Enteral PRN tylenol, fioricet daily prn could be considered if remains severe  - Avoid NSAIDs, avoid IV Benadryl    Desmoid tumors - [name]  - Restarting nirogacestat at lower dose of 50 mg BID on 8/8 after discussion with primary oncologist Dr. [name]    Chronic/stable/resolved problems:    SIBO: Hold home cefdinir, doxy    FEN: Encourage regular diet, TPN  VTE ppx: SCDs ordered, mobilize as able  Code status: Full code  Dispo: Home with HH PT/OT/TPN vs SNF pending ability to stand/walk and removal of NG tube    I personally spent 60 minutes face-to-face and non-face-to-face in the care of this patient, which includes all pre, intra, and post visit time on the date of service. This included extended counseling/discussion with patient and her family bedside. All documented time was specific to the E/M visit and does not include any procedures that may have been performed.    Chauncy Lean, MD  Hospital Medicine   ___________________________________________________________________    Subjective:  Bad day per patient - headache is back and severe. Abdominal pain is terrible, no BM today. Two liquid stools last night. No gas today. Feels like something bad is happening, very anxious. Muscles are clamped down, etc. Anxious. Mom bedside and supportive.     Labs/Studies:  Labs per EMR and Reviewed (last 24hrs)    Objective:  BP 99/44  - Pulse 79  - Temp 36.5 ??C (97.7 ??F) (Oral)  - Resp 15  - Ht 165.1 cm (5' 5)  - Wt 60.2 kg (132 lb 11.5 oz)  - SpO2 100%  - BMI 22.09 kg/m??     GEN: Mild distress, resting, Mom bedside  HEENT: NG tube in place, pale  conjunctiva, MMM, normal hearing  CV: Tachycardia noted  EXT: Grossly symmetric, very mild swelling in bilateral feet but not legs  ABD: Soft but mildly distended, tender in RLQ, +bowel sounds  SKIN: No rashes or erythema  NEURO: Muscle rigidity/increased tone in lower extremities, generalized ~4/5 weakness  PSYCH: Anxious, stressed, cooperative

## 2022-10-20 NOTE — Unmapped (Signed)
Department of Anesthesiology  Pain Medicine Division    Chronic Pain Followup Inpatient Consult Note    Requesting Attending Physician:  Suzzanne Cloud, MD  Service Requesting Consult:  Med [facility])    Assessment/Recommendations:  The patient was seen in consultation on request of Rocco Serene, MD regarding assistance with pain management for patient's chronic intractable abdominal pain with now acute on chronic recurrent ileus/NG tube placement.  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      Patient currently established with [facility] pain management center Dr. Dyann Ruddle, has been on oxycodone 10 mg 3 times daily, Suboxone 0.5 mg 3 times daily along with baclofen and pregabalin. Overall goal is to wean to buprenorphine and non-opioid medications and eventually off of buprenorphine as well. Through her psychiatrist she is on Ativan 0.5 mg 3 times daily as needed. She has contracted to avoid taking the Ativan at the same time or close to when she takes her opioid pain medications.    Patient has had multiple prior inpatient stays either regarding acute exacerbation of chronic pain or ileus.  In the past 12 months she has had 2 infusions of ketamine with limited benefit (last infusion in May 2024) and at one point there was some transaminitis which was thought to be attributed from other etiologies like TPN versus other intra-abdominal pathologies.  In the past patient also has had lidocaine infusions during inpatient stay with limited/variable benefit.        Regarding ileus and her constipation, After discussion with the patient and mother we came to an agreement to attempt with a low-dose of naloxegol 12.5 mg, she has taken Alvimopan around surgery time before and seemed to tolerate it. GI team recommended against neostigmine at this time. We expect the naloxegol to increase her abdominal pain at first but hope that we can continue it and even increase to 25mg  if able.     This is no doubt a very complex situation. Her biggest issue at this point seems to be the ileus. Medication management should focus on non-opioid medications, although she has trialed others in past with side effects or minimal benefit.  Hopefully minimizing opioids and working on her ileus will help get her out of the hospital quicker than previously.  Our goal while admitted would be for her to be able to manage her pain without the need for IV opioids and instead take oxycodone up to 15 mg 4 times daily as needed, with the goal of trying to get as close to oxycodone 10 mg 3 times daily as needed as possible.    Her Suboxone is currently increased from her home dose in an attempt to provide optimal basal control of her pain, favoring an increase in buprenorphine which is a partial mu opioid agonist over use of full mu opioid agonists, which can have worse GI side effect profiles.  Again, ultimately the goal would be to wean off of oxy and manage her pain with buprenorphine alone.     Has had lidocaine gtt this admission with most help. Has been avoiding IV HM with success. Has been having HA's c/w tension type headaches with likely rebound/medication over use component.     Interval Events: Pt reports new RLQ pain/cramping that feels like a baby kicking. Reports movement is visible to others. This began evening of 8/10 and has persisted.  Concerned this may be early sign of obstruction. This pain is most bothersome right now and has caused her to need her oxycodone 10 mg BID PRN both doses for past 2 days.     Vital Sign Trends: Vitally stable, afebrile.    Hospital Analgesic Medications:  Baclofen 10 mg TID  Suboxone 0.5 mg SL TID  APAP 650 mg q6 PRN  Ketamine 0.25 mg/kg/hr 8/1-9 (stopped 8/9)    Recommendations:  -The chronic pain service is a consult service and does not place orders, just makes recommendations (except ketamine and lidocaine infusions)   -Please evaluate all patients on opioids for appropriateness of prescribing narcan at discharge.  The chronic pain service can assist with this.  Nasal narcan is covered by most insurances.  -Recommendations given apply to the current hospitalization and do not reflect long term recommendations.    - Continue Baclofen 10 mg TID  - Continue PRN acetaminophen  - Continue Suboxone 0.5 mg SL TID  -Okay to treat pain flares with as needed oxycodone but would avoid escalating opioid dosing if possible. Continue oxycodone 10 mg BID PRN. Would like to focus on treating acute pain, getting her medically stable, and have her follow up with [name] for any further titration of chronic pain medication.   - Continue IV APAP holiday for concern for rebound headache but may have PRN g-tube doses  - Pt will continue to try to avoid PRN Ativan use  - Recommend continued physical therapy to work on bed level mobility   - Recommend involvement of inpatient psychology and complex care given patient's distress surrounding hospitalization  - Agree with neurology consultation for rigidity and shaking episodes.  - Patient would likely benefit from pain psychology evaluation post-discharge (in [facility])    We will sign off at this time.  Please contact the chronic pain service if we can be of further assistance with pain control. Please reach out to pain team when discharge date is clear for assistance in scheduling outpatient pain clinic follow up.     Naloxone Rx at discharge?  Is patient on opioids? Yes.  1)Is dose >50MME?  Yes.  2) Is patient prescribed a benzodiazepine (w opioids)? Yes.  3)Hx of overdose?  No.  4) Hx of substance use disorder? No.  5) Opioids likely to last greater than a week after discharge? Yes.     If yes to 2 or more, prescribe naloxone at discharge.  Nasal narcan for most insured (Nasal narcan 4mg /actuation, prescribe 1 kit, instructions at [email or web address]).  For uninsured, chronic pain can work to assist in finding an option.  OTC nasal narcan now available at most pharmacies for around $45.    Interim History  There were no Acute Events Overnight.  The patient is not obtaining adequate pain relief on current medication regimen and feels that their pain is Somewhat controlled.    Inpatient Medications  Current Facility-Administered Medications   Medication Dose Route Frequency Provider Last Rate Last Admin    acetaminophen (TYLENOL) tablet 650 mg  650 mg Enteral tube: gastric Q6H PRN Suzzanne Cloud, MD   650 mg at 10/19/22 1536    aluminum-magnesium hydroxide-simethicone (MAALOX PLUS) 200-200-20 mg/5 mL suspension 60 mL  60 mL Oral Q6H PRN Suzzanne Cloud, MD        baclofen (LIORESAL) tablet 15 mg  15 mg Oral TID Suzzanne Cloud, MD   15 mg at 10/20/22 0819    bisacodyl (DULCOLAX) suppository  10 mg  10 mg Rectal BID Lambert Mody, MD PhD   10 mg at 10/14/22 2055    buprenorphine-naloxone (SUBOXONE) 2-0.5 mg SL film 0.5 mg of buprenorphine  0.25 Film Sublingual TID Lambert Mody, MD PhD   0.5 mg of buprenorphine at 10/20/22 8546    calcium carbonate (TUMS) chewable tablet 400 mg elem calcium  400 mg elem calcium Oral Daily PRN Terri Piedra, AGNP        cetirizine (ZYRTEC) tablet 20 mg  20 mg Enteral tube: gastric BID Kateri Plummer, MD   20 mg at 10/20/22 0819    CHEMO CLARIFICATION ORDER   Other Continuous PRN Lestine Box, PharmD        diphenhydrAMINE (BENADRYL) capsule 50 mg  50 mg Oral Q6H PRN Suzzanne Cloud, MD   50 mg at 10/19/22 1536    famotidine (PF) (PEPCID) injection 20 mg  20 mg Intravenous At bedtime Rocco Serene, MD   20 mg at 10/19/22 2333    fat emulsion 20 % (INTRAlipid) infusion 100 mL  100 mL Intravenous Once PRN Lobonc, Ammie Ferrier, MD        fat emulsion 20 % (INTRAlipid) infusion 200 mL  200 mL Intravenous Once PRN Lobonc, Ammie Ferrier, MD Parenteral Nutrition (CENTRAL)   Intravenous Continuous Suzzanne Cloud, MD 45 mL/hr at 10/19/22 2325 New Bag at 10/19/22 2325    And    fat emulsion 20 % with fish oil (SMOFlipid) infusion 250 mL  250 mL Intravenous Continuous Suzzanne Cloud, MD 20.8 mL/hr at 10/19/22 2325 250 mL at 10/19/22 2325    Parenteral Nutrition (CENTRAL)   Intravenous Continuous Suzzanne Cloud, MD        And    fat emulsion 20 % with fish oil (SMOFlipid) infusion 250 mL  250 mL Intravenous Continuous Suzzanne Cloud, MD        guaiFENesin (ROBITUSSIN) oral syrup  200 mg Oral Q4H PRN Kateri Plummer, MD   200 mg at 10/17/22 2344    IP OKAY TO TREAT   Other Continuous PRN Sheral Apley, MD        LORazepam (ATIVAN) tablet 0.5 mg  0.5 mg Oral TID PRN Lambert Mody, MD PhD   0.5 mg at 10/20/22 2703    mirtazapine (REMERON) tablet 15 mg  15 mg Enteral tube: gastric Nightly Kateri Plummer, MD   15 mg at 10/19/22 2321    naloxegol (MOVANTIK) tablet 25 mg  25 mg Oral Daily Kateri Plummer, MD   25 mg at 10/20/22 0820    nirogacestat (OGSIVEO) tablet 50 mg *Patient Supplied*  50 mg Oral BID Sheral Apley, MD   50 mg at 10/20/22 0821    ondansetron (ZOFRAN) injection 4 mg  4 mg Intravenous Q12H PRN Suzzanne Cloud, MD   4 mg at 10/20/22 0820    oxyCODONE (ROXICODONE) immediate release tablet 10 mg  10 mg Oral BID PRN Kateri Plummer, MD   10 mg at 10/20/22 0819    pantoprazole (Protonix) injection 40 mg  40 mg Intravenous BID Rocco Serene, MD   40 mg at 10/20/22 0820    phenol (CHLORASEPTIC) 1.4 % spray 2 spray  2 spray Mucous Membrane Q2H PRN Rocco Serene, MD   2 spray at 10/03/22 0138    polyethylene glycol (MIRALAX) packet 17 g  17 g Oral Daily PRN Suzzanne Cloud, MD  polyethylene glycol (MIRALAX) packet 17 g  17 g Oral Daily Suzzanne Cloud, MD   17 g at 10/20/22 0820    promethazine (PHENERGAN) 6.5 mg in sodium chloride (NS) 0.9 % 50 mL IVPB  6.5 mg Intravenous Q6H PRN Suzzanne Cloud, MD   Stopped at 10/20/22 0703    sodium chloride (NS) 0.9 % infusion  10 mL/hr Intravenous Continuous Nat Christen., MD        sodium chloride 0.45% (1/2 NS) infusion  100 mL/hr Intravenous Continuous [name], MD 100 mL/hr at 10/20/22 0423 100 mL/hr at 10/20/22 0423    zinc oxide-cod liver oil (DESITIN 40%) Paste   Topical Daily PRN Arman Filter, MD   Given at 10/13/22 1015     Objective:     Vital Signs    Temp:  [36.1 ??C (97 ??F)-36.7 ??C (98 ??F)] 36.5 ??C (97.7 ??F)  Heart Rate:  [79-96] 81  SpO2 Pulse:  [79-96] 81  Resp:  [12-17] 12  BP: (99-121)/(44-60) 109/54  MAP (mmHg):  [60-78] 72  SpO2:  [96 %-100 %] 100 %    Physical Exam     GENERAL:  Well developed, well-nourished female in no distress.  HEAD/NECK:    Normocephalic/atraumatic. NGT present.  CARDIOVASCULAR:   WWP, regular tate  LUNGS:   Normal work of breathing, [place]  ABDOMEN  Soft, non-distended.  EXTREMITIES:  Warm and well-perfused.  NEUROLOGIC:    The patient was alert and oriented times four with normal language, attention, cognition and memory. Cranial nerve exam was grossly normal.    MUSCULOSKELETAL:    SKIN:  No obvious rashes lesions or erythema  PSY:  Appropriate affect and mood.    Test Results    Lab Results   Component Value Date    CREATININE 0.41 (L) 10/20/2022     Lab Results   Component Value Date    ALKPHOS 102 10/20/2022    BILITOT <0.2 (L) 10/20/2022    BILIDIR <0.10 10/20/2022    PROT 5.7 10/20/2022    ALBUMIN 3.3 (L) 10/20/2022    ALT 430 (H) 10/20/2022    AST 420 (H) 10/20/2022     Problem List    Principal Problem:    Other acute postprocedural pain  Active Problems:    [name]    Intestinal polyps    [name]    Neoplasm related pain    Physical deconditioning    History of colectomy    Intractable abdominal pain    Generalized anxiety disorder with panic attacks    SBO (small bowel obstruction) (CMS-HCC)    Small intestinal bacterial overgrowth (SIBO)    Rebeca Alert, DO  Addiction Medicine Fellow  10/20/22

## 2022-10-20 NOTE — Unmapped (Incomplete)
Department of Anesthesiology  Pain Medicine Division    Chronic Pain Followup Inpatient Consult Note    Requesting Attending Physician:  Suzzanne Cloud, MD  Service Requesting Consult:  Med [place] [facility])    Assessment/Recommendations:  The patient was seen in consultation on request of Rocco Serene, MD regarding assistance with pain management for patient's chronic intractable abdominal pain with now acute on chronic recurrent ileus/NG tube placement.  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      Patient currently established with [facility] pain management center Dr. Dyann Ruddle, has been on oxycodone 10 mg 3 times daily, Suboxone 0.5 mg 3 times daily along with baclofen and pregabalin. Overall goal is to wean to buprenorphine and non-opioid medications and eventually off of buprenorphine as well. Through her psychiatrist she is on Ativan 0.5 mg 3 times daily as needed. She has contracted to avoid taking the Ativan at the same time or close to when she takes her opioid pain medications.    Patient has had multiple prior inpatient stays either regarding acute exacerbation of chronic pain or ileus.  In the past 12 months she has had 2 infusions of ketamine with limited benefit (last infusion in May 2024) and at one point there was some transaminitis which was thought to be attributed from other etiologies like TPN versus other intra-abdominal pathologies.  In the past patient also has had lidocaine infusions during inpatient stay with limited/variable benefit.        Regarding ileus and her constipation, After discussion with the patient and mother we came to an agreement to attempt with a low-dose of naloxegol 12.5 mg, she has taken Alvimopan around surgery time before and seemed to tolerate it. GI team recommended against neostigmine at this time. We expect the naloxegol to increase her abdominal pain at first but hope that we can continue it and even increase to 25mg  if able.     This is no doubt a very complex situation. Her biggest issue at this point seems to be the ileus. Medication management should focus on non-opioid medications, although she has trialed others in past with side effects or minimal benefit.  Hopefully minimizing opioids and working on her ileus will help get her out of the hospital quicker than previously.  Our goal while admitted would be for her to be able to manage her pain without the need for IV opioids and instead take oxycodone up to 15 mg 4 times daily as needed, with the goal of trying to get as close to oxycodone 10 mg 3 times daily as needed as possible.    Her Suboxone is currently increased from her home dose in an attempt to provide optimal basal control of her pain, favoring an increase in buprenorphine which is a partial mu opioid agonist over use of full mu opioid agonists, which can have worse GI side effect profiles.  Again, ultimately the goal would be to wean off of oxy and manage her pain with buprenorphine alone.     Has had lidocaine gtt this admission with most help. Has been avoiding IV HM with success. Has been having HA's c/w tension type headaches with likely rebound/medication over use component.     Interval Events: No acute events overnight. Patient received PO Oxy x 2 yesterday.    Vital Sign Trends: Vitally stable, afebrile.    Hospital Analgesic  Medications:  Baclofen 10 mg TID  Suboxone 1-0.5 mg SL TID  APAP 650 mg q6 PRN  Ketamine 0.25 mg/kg/hr 8/1-9 (stopped 8/9)    Recommendations:  -The chronic pain service is a consult service and does not place orders, just makes recommendations (except ketamine and lidocaine infusions)   -Please evaluate all patients on opioids for appropriateness of prescribing narcan at discharge.  The chronic pain service can assist with this.  Nasal narcan is covered by most insurances.  -Recommendations given apply to the current hospitalization and do not reflect long term recommendations.    - Continue Baclofen 10 mg TID  - Continue PRN acetaminophen  - Continue Suboxone 2-0.5  -Okay to treat pain flares with as needed oxycodone. Discussed with hospitalist, Dr. Nilda Riggs, will add oxycodone 10 mg BID PRN. Would like to focus on treating acute pain, getting her medically stable, and have her follow up with [name] for any further titration of chronic pain medication.   - Continue IV APAP holiday for concern for rebound headache but may have PRN g-tube doses  - Pt will continue to try to avoid PRN Ativan use  - If headaches are not improved from IV Tylenol holiday, consider Fioricet once daily as needed for headaches  - Recommend continued physical therapy to work on bed level mobility   - Recommend involvement of inpatient psychology and complex care given patient's distress surrounding hospitalization  - Agree with neurology consultation for rigidity and shaking episodes.  - Patient would likely benefit from pain psychology evaluation post-discharge (in [facility])    We will continue to follow.    Naloxone Rx at discharge?  Is patient on opioids? Yes.  1)Is dose >50MME?  Yes.  2) Is patient prescribed a benzodiazepine (w opioids)? Yes.  3)Hx of overdose?  No.  4) Hx of substance use disorder? No.  5) Opioids likely to last greater than a week after discharge? Yes.     If yes to 2 or more, prescribe naloxone at discharge.  Nasal narcan for most insured (Nasal narcan 4mg /actuation, prescribe 1 kit, instructions at [email or web address]).  For uninsured, chronic pain can work to assist in finding an option.  OTC nasal narcan now available at most pharmacies for around $45.    Interim History  There were no Acute Events Overnight.  The patient is not obtaining adequate pain relief on current medication regimen and feels that their pain is Somewhat controlled    Inpatient Medications  Current Facility-Administered Medications   Medication Dose Route Frequency Provider Last Rate Last Admin    acetaminophen (TYLENOL) tablet 650 mg  650 mg Enteral tube: gastric Q6H PRN Suzzanne Cloud, MD   650 mg at 10/19/22 1536    aluminum-magnesium hydroxide-simethicone (MAALOX PLUS) 200-200-20 mg/5 mL suspension 60 mL  60 mL Oral Q6H PRN Suzzanne Cloud, MD        baclofen (LIORESAL) tablet 15 mg  15 mg Oral TID Suzzanne Cloud, MD   15 mg at 10/19/22 2110    bisacodyl (DULCOLAX) suppository 10 mg  10 mg Rectal BID Lambert Mody, MD PhD   10 mg at 10/14/22 2055    buprenorphine-naloxone (SUBOXONE) 2-0.5 mg SL film 0.5 mg of buprenorphine  0.25 Film Sublingual TID Lambert Mody, MD PhD   0.5 mg of buprenorphine at 10/19/22 2116    calcium carbonate (TUMS) chewable tablet 400 mg elem calcium  400 mg elem calcium Oral Daily PRN Terri Piedra,  AGNP        cetirizine (ZYRTEC) tablet 20 mg  20 mg Enteral tube: gastric BID Kateri Plummer, MD   20 mg at 10/19/22 2108    CHEMO CLARIFICATION ORDER   Other Continuous PRN Lestine Box, PharmD        diphenhydrAMINE (BENADRYL) capsule 50 mg  50 mg Oral Q6H PRN Suzzanne Cloud, MD   50 mg at 10/19/22 1536    famotidine (PF) (PEPCID) injection 20 mg  20 mg Intravenous At bedtime Rocco Serene, MD   20 mg at 10/19/22 2333    fat emulsion 20 % (INTRAlipid) infusion 100 mL  100 mL Intravenous Once PRN Lobonc, Ammie Ferrier, MD        fat emulsion 20 % (INTRAlipid) infusion 200 mL  200 mL Intravenous Once PRN Lobonc, Ammie Ferrier, MD        Parenteral Nutrition (CENTRAL)   Intravenous Continuous Suzzanne Cloud, MD 45 mL/hr at 10/19/22 2325 New Bag at 10/19/22 2325    And    fat emulsion 20 % with fish oil (SMOFlipid) infusion 250 mL  250 mL Intravenous Continuous Suzzanne Cloud, MD 20.8 mL/hr at 10/19/22 2325 250 mL at 10/19/22 2325    guaiFENesin (ROBITUSSIN) oral syrup  200 mg Oral Q4H PRN Kateri Plummer, MD   200 mg at 10/17/22 2344    IP OKAY TO TREAT   Other Continuous PRN Sheral Apley, MD        LORazepam (ATIVAN) tablet 0.5 mg  0.5 mg Oral TID PRN Lambert Mody, MD PhD   0.5 mg at 10/20/22 1610    mirtazapine (REMERON) tablet 15 mg  15 mg Enteral tube: gastric Nightly Kateri Plummer, MD   15 mg at 10/19/22 2321    naloxegol (MOVANTIK) tablet 25 mg  25 mg Oral Daily Kateri Plummer, MD   25 mg at 10/19/22 0847    nirogacestat (OGSIVEO) tablet 50 mg *Patient Supplied*  50 mg Oral BID Sheral Apley, MD   50 mg at 10/19/22 2111    ondansetron (ZOFRAN) injection 4 mg  4 mg Intravenous Q12H PRN Suzzanne Cloud, MD   4 mg at 10/19/22 1249    oxyCODONE (ROXICODONE) immediate release tablet 10 mg  10 mg Oral BID PRN Kateri Plummer, MD   10 mg at 10/19/22 2320    pantoprazole (Protonix) injection 40 mg  40 mg Intravenous BID Rocco Serene, MD   40 mg at 10/19/22 2333    phenol (CHLORASEPTIC) 1.4 % spray 2 spray  2 spray Mucous Membrane Q2H PRN Rocco Serene, MD   2 spray at 10/03/22 0138    polyethylene glycol (MIRALAX) packet 17 g  17 g Oral Daily PRN Suzzanne Cloud, MD        polyethylene glycol [facility]) packet 17 g  17 g Oral Daily Suzzanne Cloud, MD   17 g at 10/19/22 1206    promethazine (PHENERGAN) 6.5 mg in sodium chloride (NS) 0.9 % 50 mL IVPB  6.5 mg Intravenous Q6H PRN Suzzanne Cloud, MD 171 mL/hr at 10/20/22 0643 6.5 mg at 10/20/22 0643    sodium chloride (NS) 0.9 % infusion  10 mL/hr Intravenous Continuous Nat Christen., MD        sodium chloride 0.45% (1/2 NS) infusion  100 mL/hr Intravenous Continuous [name], MD 100 mL/hr at 10/20/22 0423 100 mL/hr at 10/20/22 0423    zinc  oxide-cod liver oil (DESITIN 40%) Paste   Topical Daily PRN Arman Filter, MD   Given at 10/13/22 1015     Objective:     Vital Signs    Temp:  [36.1 ??C (97 ??F)-36.7 ??C (98 ??F)] 36.6 ??C (97.9 ??F)  Heart Rate:  [76-96] 79  SpO2 Pulse:  [76-96] 79  Resp:  [13-17] 15  BP: (99-121)/(44-68) 99/44  MAP (mmHg):  [60-84] 60  SpO2:  [96 %-100 %] 100 %    Physical Exam     GENERAL:  Well developed, well-nourished female in no distress.  HEAD/NECK:    Normocephalic/atraumatic. NGT present.  CARDIOVASCULAR:   WWP, regular tate  LUNGS:   Normal work of breathing, [place]  ABDOMEN  Soft, non-distended.  EXTREMITIES:  Warm and well-perfused.  NEUROLOGIC:    The patient was alert and oriented times four with normal language, attention, cognition and memory. Cranial nerve exam was grossly normal.    MUSCULOSKELETAL:    SKIN:  No obvious rashes lesions or erythema  PSY:  Appropriate affect and mood.    Test Results    Lab Results   Component Value Date    CREATININE 0.43 (L) 10/19/2022     Lab Results   Component Value Date    ALKPHOS 70 10/18/2022    BILITOT <0.2 (L) 10/18/2022    BILIDIR <0.10 10/11/2022    PROT 5.8 10/18/2022    ALBUMIN 3.2 (L) 10/18/2022    ALT 79 (H) 10/18/2022    AST 18 10/18/2022       Luci Bank, MD  [facility] Anesthesiology, PGY-4/CA-2

## 2022-10-20 NOTE — Unmapped (Signed)
Initial Consult Note        Requesting Attending Physician:  Suzzanne Cloud, MD  Service Requesting Consult: [facility])     Assessment and Plan          [name] [name] is a 22 y.o.  female on whom I have been asked by Suzzanne Cloud, MD to consult for increased lower extremity tone and weakness.    Increased Tone - Generalized Weakness  Initially she was noted to have worsening BLE stiffness since procedure on 7/18. 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 This may represent further worsening of chronic muscle stiffness and spasms for which she was already on baclofen though increased dose has not been beneficial. Less likely acute dystonic reaction given persistent of symptoms without readministration of anti-dopaminergic medications. Clinical picture not consistent with neuroleptic malignant disorder or serotonin syndrome. Serum paraneoplastic antibody panel was weakly positive for P/Q-type Ca channel antibody which is unlikely to be clinically significant and does not fit her clinica picture. Increased tone is still suggestive of a central process and she would benefit from repeat MRI Brain w/wo. This may also represent a generalized dystonia due to CNS process or global process to include vitamin and trace mineral deficiencies and excesses. Given her extend periods on TPN she would benefit from checking nutritional levels.      RECOMMENDATIONS:   Obtain MRI Brain w/wo  Obtain Vit B1, B2, B3, B6, B9, D, E; copper, manganese, zinc    This patient was seen and discussed with Dr. Emmie Niemann, who agrees to the assessment and plan.     Chauncy Passy, MD, PharmD  Pediatric Neurology, PGY-4           HPI        Reason for Consult: Increased lower extremity tone and weakness    [name] [name] is a 22 y.o.  female on whom I have been asked by Suzzanne Cloud, MD to consult for increased lower extremity tone and weakness.    Patient was admitted on 7/18 for pain following steroid/anesthetic injection of desmoid tumor in the right forearm and left upper chest under general anesthesia. 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 She is specifically targeting tumors at her right wrist to improve mobility. Patient was scheduled for triamcinolone and bupivacaine injection by VIR under general anesthesia. Patient has had similar procedures done in the past and has generally required general anesthesia and a certain protocol for each 1. Patient and mom are clear that they only received propofol with this procedure and that they feel that increased pain and shakes that occurred following the procedure secondary to having only propofol for the procedure.     She had a RRT on 7/19 for chest pain, diaphoresis, tachycardia, retching and abdominal tenderness with continued complaint of pain. Reportedly patient was conversant and following commands. Vitals were stable and patient was afebrile. Primary team noted flexion of bilateral arms and legs with question of rigidity.     Notable medications include suboxone, ativan 0.5mg  TID PRN, oxycodone PRN, and baclofen 15mg  TID. She is also onnirogacestat (a notch receptor pathway inhibitor) for her tumors.    Since admission she has continued to have persistently increased tone and worse weakness now involving her upper extremities. She is unsure if the weakness is due to deconditioning given difficulty walking due  to stiffness or whether it is separate. She does not feel that the increase in baclofen has been helpful. She felt IV Robaxin was the most helpful. She feels more calm with Ativan but is unsure if that helps with her tone.    Allergies   Allergen Reactions    Adhesive Tape-Silicones Hives and Rash     Paper tape  And tegederm ok    Ferrlecit [Sodium Ferric Gluconat-Sucrose] Swelling and Rash    Levofloxacin Swelling and Rash     Swelling in mouth, rash,     Methylnaltrexone      Per Patient: I lost bowel control, severe abdominal cramping, and elevated BP    Neomycin Swelling     Rxn after ear drops; ear swelling    Papaya Hives    Morphine Nausea And Vomiting    Zosyn [Piperacillin-Tazobactam] Itching and Rash     Red and itchy    Compazine [Prochlorperazine] Other (See Comments)     Extreme agitation    Iron Analogues     Iron Dextran Itching     Received iron dextran 06/08/22 over 12 hours, had itching and redness/flushing during the infusion and for a couple days after. Required IV benadryl w/flares between doses and ultimately treated w/IV methylpred for 2 days.     Latex, Natural Rubber Rash      Current Facility-Administered Medications   Medication Dose Route Frequency Provider Last Rate Last Admin    acetaminophen (TYLENOL) tablet 650 mg  650 mg Enteral tube: gastric Q6H PRN Suzzanne Cloud, MD   650 mg at 10/19/22 1536    aluminum-magnesium hydroxide-simethicone (MAALOX PLUS) 200-200-20 mg/5 mL suspension 60 mL  60 mL Oral Q6H PRN Suzzanne Cloud, MD        baclofen (LIORESAL) tablet 15 mg  15 mg Oral TID Suzzanne Cloud, MD   15 mg at 10/20/22 2956    bisacodyl (DULCOLAX) suppository 10 mg  10 mg Rectal BID Lambert Mody, MD PhD   10 mg at 10/14/22 2055    buprenorphine-naloxone (SUBOXONE) 2-0.5 mg SL film 0.5 mg of buprenorphine  0.25 Film Sublingual TID Lambert Mody, MD PhD   0.5 mg of buprenorphine at 10/20/22 2130    calcium carbonate (TUMS) chewable tablet 400 mg elem calcium  400 mg elem calcium Oral Daily PRN Terri Piedra, AGNP        cetirizine (ZYRTEC) tablet 20 mg  20 mg Enteral tube: gastric BID Kateri Plummer, MD   20 mg at 10/20/22 0819    CHEMO CLARIFICATION ORDER   Other Continuous PRN Lestine Box, PharmD        diphenhydrAMINE (BENADRYL) capsule 50 mg  50 mg Oral Q6H PRN Suzzanne Cloud, MD   50 mg at 10/19/22 1536    famotidine (PF) (PEPCID) injection 20 mg  20 mg Intravenous At bedtime Rocco Serene, MD   20 mg at 10/19/22 2333    fat emulsion 20 % (INTRAlipid) infusion 100 mL  100 mL Intravenous Once PRN Lobonc, Ammie Ferrier, MD        fat emulsion 20 % (INTRAlipid) infusion 200 mL  200 mL Intravenous Once PRN Lobonc, Ammie Ferrier, MD        Parenteral Nutrition (CENTRAL)   Intravenous Continuous Suzzanne Cloud, MD 45 mL/hr at 10/19/22 2325 New Bag at 10/19/22 2325    And    fat emulsion 20 % with fish oil (SMOFlipid) infusion 250 mL  250  mL Intravenous Continuous Suzzanne Cloud, MD 20.8 mL/hr at 10/19/22 2325 250 mL at 10/19/22 2325    Parenteral Nutrition (CENTRAL)   Intravenous Continuous Suzzanne Cloud, MD        And    fat emulsion 20 % with fish oil (SMOFlipid) infusion 250 mL  250 mL Intravenous Continuous Suzzanne Cloud, MD        [facility]) oral syrup  200 mg Oral Q4H PRN Kateri Plummer, MD   200 mg at 10/17/22 2344    IP OKAY TO TREAT   Other Continuous PRN Sheral Apley, MD        LORazepam (ATIVAN) tablet 0.5 mg  0.5 mg Oral TID PRN Lambert Mody, MD PhD   0.5 mg at 10/20/22 1610    mirtazapine (REMERON) tablet 15 mg  15 mg Enteral tube: gastric Nightly Kateri Plummer, MD   15 mg at 10/19/22 2321    naloxegol (MOVANTIK) tablet 25 mg  25 mg Oral Daily Kateri Plummer, MD   25 mg at 10/20/22 0820    nirogacestat (OGSIVEO) tablet 50 mg *Patient Supplied*  50 mg Oral BID Sheral Apley, MD   50 mg at 10/20/22 0821    ondansetron (ZOFRAN) injection 4 mg  4 mg Intravenous Q12H PRN Suzzanne Cloud, MD   4 mg at 10/20/22 0820    oxyCODONE (ROXICODONE) immediate release tablet 10 mg  10 mg Oral BID PRN Kateri Plummer, MD   10 mg at 10/20/22 0819    pantoprazole (Protonix) injection 40 mg  40 mg Intravenous BID Rocco Serene, MD   40 mg at 10/20/22 0820    phenol (CHLORASEPTIC) 1.4 % spray 2 spray  2 spray Mucous Membrane Q2H PRN Rocco Serene, MD   2 spray at 10/03/22 0138    polyethylene glycol (MIRALAX) packet 17 g  17 g Oral Daily PRN Suzzanne Cloud, MD        polyethylene glycol [employer]) packet 17 g  17 g Oral Daily Suzzanne Cloud, MD   17 g at 10/20/22 0820    promethazine (PHENERGAN) 6.5 mg in sodium chloride (NS) 0.9 % 50 mL IVPB  6.5 mg Intravenous Q6H PRN Suzzanne Cloud, MD   Stopped at 10/20/22 0703    sodium chloride (NS) 0.9 % infusion  10 mL/hr Intravenous Continuous Nat Christen., MD        sodium chloride 0.45% (1/2 NS) infusion  100 mL/hr Intravenous Continuous [name], MD 100 mL/hr at 10/20/22 0423 100 mL/hr at 10/20/22 0423    zinc oxide-cod liver oil (DESITIN 40%) Paste   Topical Daily PRN Arman Filter, MD   Given at 10/13/22 1015      Medications Prior to Admission   Medication Sig Dispense Refill Last Dose    [EXPIRED] buprenorphine-naloxone (SUBOXONE) 2-0.5 mg sublingual film Place 0.25 Film (0.5 mg of buprenorphine total) under the tongue three (3) times a day. Fill on or after: 09/05/22 22 Film 0 09/25/2022    LORazepam (ATIVAN) 0.5 MG tablet Take 1 tablet (0.5 mg total) by mouth Three (3) times a day as needed for anxiety. 20 tablet 0 Unknown    promethazine (PHENERGAN) 12.5 MG tablet Take 1 tablet (12.5 mg total) by mouth every six (6) hours as needed. 20 tablet 0 Unknown    acetaminophen (TYLENOL) 500 MG tablet Take 2 tablets (1,000 mg total) by mouth every eight (8) hours.  baclofen (LIORESAL) 5 mg Tab tablet Take 1 tablet (5 mg total) by mouth Three (3) times a day as needed for muscle spasms. 30 tablet 0     cefdinir (OMNICEF) 300 MG capsule Take 1 capsule (300mg ) twice a day on Mon, Wed, Friday       cholecalciferol, vitamin D3 25 mcg, 1,000 units,, 1,000 unit (25 mcg) tablet Take 1 tablet (25 mcg total) by mouth daily. 100 tablet 3     clobetasoL (TEMOVATE) 0.05 % ointment Apply topically two (2) times a day. To stubborn/thick rashes on hands/feet ears until clear/smooth. Restart as needed 60 g 3     diclofenac sodium (VOLTAREN) 1 % gel Apply 2 g topically four (4) times a day as needed for pain.       doxycycline (VIBRA-TABS) 100 MG tablet Take 1 tablets by mouth twice a day on Tuesday, Thursday, Saturday, Sunday. 28 tablet 0     famotidine (PEPCID) 20 MG tablet Take 1 tablet (20 mg total) by mouth nightly. 90 tablet 3     hydrocortisone (ANUSOL-HC) 2.5 % rectal cream Insert into the rectum two (2) times a day as needed.       lidocaine 4 % patch Place 2 patches on the skin daily for 7 days. Apply to affected area for 12 hours, then remove for 12 hours. 15 patch 0     LORazepam (ATIVAN) 0.5 MG tablet Take 1 tablet (0.5 mg total) by mouth Three (3) times a day as needed for anxiety. 270 tablet 2     mirtazapine (REMERON) 15 MG tablet Take  1 tablet by mouth (15mg ) at night with 7.5mg  tablet to make 22.5mg  nightly 30 tablet 0     naloxone (NARCAN) 4 mg nasal spray One spray in either nostril once for known/suspected opioid overdose. May repeat every 2-3 minutes in alternating nostril til EMS arrives 2 each 0     nirogacestat (OGSIVEO) 50 mg tablet Take 3 tablets (150 mg total) by mouth two (2) times a day. Swallow tablets whole; do not break, crush, or chew. 180 tablet 0     [EXPIRED] ondansetron (ZOFRAN-ODT) 4 MG disintegrating tablet Dissolve 1 tablet (4 mg total) by mouth every six (6) hours as needed. 20 tablet 0     oxyCODONE (ROXICODONE) 10 mg immediate release tablet Take 1 tablet (10 mg total) by mouth Three (3) times a day as needed for pain. Fill on or after: 09/08/22 90 tablet 0     pimecrolimus (ELIDEL) 1 % cream Apply topically two (2) times a day as needed. To face as needed for rash       triamcinolone (KENALOG) 0.1 % cream Apply topically two (2) times a day as needed. Apply to rash as needed       zinc oxide 10 % Crea Apply 1 application. topically daily as needed.          Past Medical History:   Diagnosis Date    Abdominal pain     Acid reflux     occas    Anesthesia complication     itching, shaking, coldness; last few surgeries have gone much better    Cancer (CMS-HCC)     Cataract of right eye     COVID-19 virus infection 01/2019    Cyst of thyroid determined by ultrasound     monitoring    [name]     2 right forearm, 1 left thigh, 1 right scapula, 1 under left clavicle; multiple  Difficult intravenous access     [name] (familial adenomatous polyposis)     [name]     Gastric polyps     History of chemotherapy     last treatment approx 05/2019    History of colon polyps     History of COVID-19 01/2019    Iron deficiency anemia due to chronic blood loss     received iron infusion 11-2019    PONV (postoperative nausea and vomiting)     Rectal bleeding     Syncopal episodes     especially if becoming dehydrated       Past Surgical History:   Procedure Laterality Date    COLON SURGERY      cyroablation      cystis removal      desmoid removal      PR CLOSE ENTEROSTOMY,RESEC+ANAST N/A 10/09/2020    Procedure: ILEOSTOMY TAKEDOWN;  Surgeon: Mickle Asper, MD;  Location: OR [place];  Service: General Surgery    PR COLONOSCOPY W/BIOPSY SINGLE/MULTIPLE N/A 10/27/2012    Procedure: COLONOSCOPY, FLEXIBLE, PROXIMAL TO SPLENIC FLEXURE; WITH BIOPSY, SINGLE OR MULTIPLE;  Surgeon: Shirlyn Goltz Mir, MD;  Location: PEDS PROCEDURE ROOM [place];  Service: Gastroenterology    PR COLONOSCOPY W/BIOPSY SINGLE/MULTIPLE N/A 09/14/2013    Procedure: COLONOSCOPY, FLEXIBLE, PROXIMAL TO SPLENIC FLEXURE; WITH BIOPSY, SINGLE OR MULTIPLE;  Surgeon: Shirlyn Goltz Mir, MD;  Location: PEDS PROCEDURE ROOM [place];  Service: Gastroenterology    PR COLONOSCOPY W/BIOPSY SINGLE/MULTIPLE N/A 11/08/2014    Procedure: COLONOSCOPY, FLEXIBLE, PROXIMAL TO SPLENIC FLEXURE; WITH BIOPSY, SINGLE OR MULTIPLE;  Surgeon: Arnold Long Mir, MD;  Location: PEDS PROCEDURE ROOM [facility];  Service: Gastroenterology    PR COLONOSCOPY W/BIOPSY SINGLE/MULTIPLE N/A 12/26/2015    Procedure: COLONOSCOPY, FLEXIBLE, PROXIMAL TO SPLENIC FLEXURE; WITH BIOPSY, SINGLE OR MULTIPLE;  Surgeon: Arnold Long Mir, MD;  Location: PEDS PROCEDURE ROOM [facility];  Service: Gastroenterology    PR COLONOSCOPY W/BIOPSY SINGLE/MULTIPLE N/A 09/02/2017    Procedure: COLONOSCOPY, FLEXIBLE, PROXIMAL TO SPLENIC FLEXURE; WITH BIOPSY, SINGLE OR MULTIPLE;  Surgeon: Arnold Long Mir, MD;  Location: PEDS PROCEDURE ROOM [place];  Service: Gastroenterology    PR COLSC FLX W/REMOVAL LESION BY HOT BX FORCEPS N/A 08/27/2016    Procedure: COLONOSCOPY, FLEXIBLE, PROXIMAL TO SPLENIC FLEXURE; W/REMOVAL TUMOR/POLYP/OTHER LESION, HOT BX FORCEP/CAUTE;  Surgeon: Arnold Long Mir, MD;  Location: PEDS PROCEDURE ROOM [facility];  Service: Gastroenterology    PR COLSC FLX W/RMVL OF TUMOR POLYP LESION SNARE TQ N/A 02/25/2019    Procedure: COLONOSCOPY FLEX; W/REMOV TUMOR/LES BY SNARE;  Surgeon: Helyn Numbers, MD;  [facility]: [facility] [place] [facility];  Service: Gastroenterology    PR COLSC FLX W/RMVL OF TUMOR POLYP LESION SNARE TQ N/A 03/13/2020    Procedure: COLONOSCOPY FLEX; W/REMOV TUMOR/LES BY SNARE;  Surgeon: Helyn Numbers, MD;  Location: [facility] [place] [facility];  Service: Gastroenterology    PR EXC SKIN BENIG 2.1-3 CM TRUNK,ARM,LEG Right 02/25/2017    Procedure: EXCISION, BENIGN LESION INCLUDE MARGINS, EXCEPT SKIN TAG, LEGS; EXCISED DIAMETER 2.1 TO 3.0 CM;  Surgeon: Clarene Duke, MD;  Location: CHILDRENS OR [facility];  Service: Plastics    PR EXC SKIN BENIG 3.1-4 CM TRUNK,ARM,LEG Right 02/25/2017    Procedure: EXCISION, BENIGN LESION INCLUDE MARGINS, EXCEPT SKIN TAG, ARMS; EXCISED DIAMETER 3.1 TO 4.0 CM;  Surgeon: Clarene Duke, MD;  Location: CHILDRENS OR [facility];  Service: Plastics    PR EXC SKIN BENIG >4 CM FACE,FACIAL Right 02/25/2017    Procedure: EXCISION, OTHER BENIGN LES INCLUD  MARGIN, FACE/EARS/EYELIDS/NOSE/LIPS/MUCOUS MEMBRANE; EXCISED DIAM >4.0 CM;  Surgeon: Clarene Duke, MD;  Location: CHILDRENS OR [facility];  Service: Plastics    PR EXC TUMOR SOFT TISSUE LEG/ANKLE SUBQ 3+CM Right 08/05/2019    Procedure: EXCISION, TUMOR, SOFT TISSUE OF LEG OR ANKLE AREA, SUBCUTANEOUS; 3 CM OR GREATER;  Surgeon: Arsenio Katz, MD;  Location: MAIN OR [place];  Service: Plastics    PR EXC TUMOR SOFT TISSUE LEG/ANKLE SUBQ <3CM Right 08/05/2019    Procedure: EXCISION, TUMOR, SOFT TISSUE OF LEG OR ANKLE AREA, SUBCUTANEOUS; LESS THAN 3 CM;  Surgeon: Arsenio Katz, MD;  Location: MAIN OR [facility];  Service: Plastics    PR LAP, SURG PROCTECTOMY W J-POUCH N/A 08/10/2020    Procedure: ROBOTIC ASSISTED LAPAROSCOPIC PROCTOCOLECTOMY, ILEAL J POUCH, WITH OSTOMY;  Surgeon: Mickle Asper, MD;  Location: OR [place];  Service: General Surgery    PR NDSC EVAL INTSTINAL POUCH DX W/COLLJ SPEC SPX N/A 01/23/2021    Procedure: ENDO EVAL SM INTEST POUCH; DX;  Surgeon: Modena Nunnery, MD;  Location: [facility] [place] [facility];  Service: Gastroenterology    PR NDSC EVAL INTSTINAL POUCH DX W/COLLJ SPEC SPX N/A 08/27/2021    Procedure: ENDO EVAL SM INTEST POUCH; DX;  Surgeon: Hunt Oris, MD;  Location: [facility] PROC[facility]URES [facility];  Service: Gastroenterology    PR NDSC EVAL INTSTINAL POUCH DX W/COLLJ SPEC SPX N/A 12/09/2021    Procedure: ENDO EVAL SM INTEST POUCH; DX;  Surgeon: Vidal Schwalbe, MD;  [facility]: [facility]URES [facility];  Service: Gastroenterology    PR NDSC EVAL INTSTINAL POUCH DX W/COLLJ [facility] SPX Left 04/09/2022    Procedure: ENDO EVAL SM INTEST POUCH; DX;  Surgeon: Modena Nunnery, MD;  Location: [facility] [facility];  Service: Gastroenterology    PR NDSC EVAL INTSTINAL POUCH DX W/COLLJ SPEC SPX N/A 08/05/2022    Procedure: ENDO EVAL SM INTEST POUCH; DX;  Surgeon: Modena Nunnery, MD;  [facility]: [facility] PROC[facility]URES [facility];  Service: Gastroenterology    PR NDSC EVAL INTSTINAL POUCH W/BX SINGLE/MULTIPLE N/A 01/20/2022    Procedure: ENDOSCOPIC EVAL OF SMALL INTESTINAL POUCH; DIAGNOSTIC, No biopsies;  Surgeon: Andrey Farmer, MD;  Location: GI PROCEDURES [facility];  Service: Gastroenterology    PR NDSC EVAL INTSTINAL POUCH W/BX SINGLE/MULTIPLE N/A 02/13/2022    Procedure: ENDOSCOPIC EVAL OF SMALL INTESTINAL POUCH; DIAGNOSTIC, WITH BIOPSY;  Surgeon: Bronson Curb, MD;  Location: [facility] PROCEDURES [facility];  Service: Gastroenterology    PR UNLISTED PROCEDURE SMALL INTESTINE  01/23/2021    Procedure: UNLISTED PROCEDURE, SMALL INTESTINE;  Surgeon: Modena Nunnery, MD;  [facility] [place] [facility];  Service: Gastroenterology    PR UNLISTED PROCEDURE SMALL INTESTINE  02/13/2022    Procedure: UNLISTED PROCEDURE, SMALL INTESTINE;  Surgeon: Bronson Curb, MD;  Location: [facility]URES [facility];  Service: Gastroenterology    PR UPPER GI ENDOSCOPY,BIOPSY N/A 10/27/2012    Procedure: UGI ENDOSCOPY; WITH BIOPSY, SINGLE OR MULTIPLE;  Surgeon: Shirlyn Goltz Mir, MD;  Location: PEDS PROCEDURE ROOM [facility];  Service: Gastroenterology    PR UPPER GI ENDOSCOPY,BIOPSY N/A 09/14/2013    Procedure: UGI ENDOSCOPY; WITH BIOPSY, SINGLE OR MULTIPLE;  Surgeon: Shirlyn Goltz Mir, MD;  Location: PEDS PROCEDURE ROOM [facility];  Service: Gastroenterology    PR UPPER GI ENDOSCOPY,BIOPSY N/A 11/08/2014    Procedure: UGI ENDOSCOPY; WITH BIOPSY, SINGLE OR MULTIPLE;  Surgeon: Arnold Long Mir, MD;  Location: PEDS PROCEDURE ROOM [facility];  Service: Gastroenterology    PR UPPER  GI ENDOSCOPY,BIOPSY N/A 12/26/2015    Procedure: UGI ENDOSCOPY; WITH BIOPSY, SINGLE OR MULTIPLE;  Surgeon: Arnold Long Mir, MD;  Location: PEDS PROCEDURE ROOM [place];  Service: Gastroenterology    PR UPPER GI ENDOSCOPY,BIOPSY N/A 08/27/2016    Procedure: UGI ENDOSCOPY; WITH BIOPSY, SINGLE OR MULTIPLE;  Surgeon: Arnold Long Mir, MD;  Location: PEDS PROCEDURE ROOM [facility];  Service: Gastroenterology    PR UPPER GI ENDOSCOPY,BIOPSY N/A 09/02/2017    Procedure: UGI ENDOSCOPY; WITH BIOPSY, SINGLE OR MULTIPLE;  Surgeon: Arnold Long Mir, MD;  Location: PEDS PROCEDURE ROOM [facility];  Service: Gastroenterology    PR UPPER GI ENDOSCOPY,BIOPSY N/A 03/13/2020    Procedure: UGI ENDOSCOPY; WITH BIOPSY, SINGLE OR MULTIPLE;  Surgeon: Helyn Numbers, MD;  Location: [facility] [facility];  Service: Gastroenterology    PR UPPER GI ENDOSCOPY,BIOPSY N/A 09/05/2021    Procedure: UGI ENDOSCOPY; WITH BIOPSY, SINGLE OR MULTIPLE;  Surgeon: Wendall Papa, MD;  Location: GI PROCEDURES [facility];  Service: Gastroenterology    PR UPPER GI ENDOSCOPY,DIAGNOSIS N/A 01/20/2022    Procedure: UGI ENDO, INCLUDE ESOPHAGUS, STOMACH, & DUODENUM &/OR JEJUNUM; DX W/WO COLLECTION SPECIMN, BY BRUSH OR WASH;  Surgeon: Andrey Farmer, MD;  Location: GI PROCEDURES [facility];  Service: Gastroenterology    TUMOR REMOVAL      multiple-head, neck, back, hand, right flank, multiple       Social History     Socioeconomic History    Marital status: Single     Spouse name: None    Number of children: None    Years of education: None    Highest education level: None   Tobacco Use    Smoking status: Never     Passive exposure: Past    Smokeless tobacco: Never   Vaping Use    Vaping status: Never Used   Substance and Sexual Activity    Alcohol use: Never    Drug use: Never    Sexual activity: Never   Other Topics Concern    Do you use sunscreen? Yes    Tanning bed use? No    Are you easily burned? No    Excessive sun exposure? No    Blistering sunburns? 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     Social Determinants of Health     Financial Resource Strain: Low Risk  (09/26/2022)    Overall Financial Resource Strain (CARDIA)     Difficulty of Paying Living Expenses: Not very hard   Food Insecurity: No Food Insecurity (09/26/2022)    Hunger Vital Sign     Worried About Running Out of Food in the Last Year: Never true     Ran Out of Food in the Last Year: Never true   Transportation Needs: No Transportation Needs (09/26/2022)    PRAPARE - Therapist, art (Medical): No     Lack of Transportation (Non-Medical): No     Family History   Problem Relation Age of Onset    No Known Problems Mother     No Known Problems Father     No Known Problems Sister     No Known Problems Brother     Stroke Maternal Grandmother     Other Maternal Grandmother         benign lesions of liver and pancreas, further details unknown    Cancer Maternal Grandmother  Diabetes Maternal Grandmother     Hypertension Maternal Grandmother     Thyroid disease Maternal Grandmother     Arthritis Maternal Grandfather     Asthma Maternal Grandfather     COPD Paternal Grandmother         Deceased    Miscarriages / Stillbirths Paternal Grandmother     Alcohol abuse Paternal Grandfather         Deceased    No Known Problems Maternal Aunt     No Known Problems Maternal Uncle     No Known Problems Paternal Aunt     No Known Problems Paternal Uncle     Anesthesia problems Neg Hx     Broken bones Neg Hx     Cancer Neg Hx     Clotting disorder Neg Hx     Collagen disease Neg Hx     Diabetes Neg Hx     Dislocations Neg Hx     Fibromyalgia Neg Hx     Gout Neg Hx     Hemophilia Neg Hx     Osteoporosis Neg Hx     Rheumatologic disease Neg Hx     Scoliosis Neg Hx     Severe sprains Neg Hx     Sickle cell anemia Neg Hx     Spinal Compression Fracture Neg Hx     Melanoma Neg Hx     Basal cell carcinoma Neg Hx     Squamous cell carcinoma Neg Hx        Code Status: Full Code     Review of Systems     A 10-system review of systems was conducted and was negative except as documented above in the HPI.       Objective        Temp:  [36.1 ??C (97 ??F)-36.7 ??C (98 ??F)] 36.5 ??C (97.7 ??F)  Heart Rate:  [79-96] 81  SpO2 Pulse:  [79-96] 81  Resp:  [12-17] 12  BP: (99-121)/(44-60) 109/54  MAP (mmHg):  [60-78] 72  SpO2:  [96 %-100 %] 100 %  I/O this shift:  In: 1131.8 [P.O.:50; I.V.:400; NG/GT:60; IV Piggyback:57]  Out: 1750 [Urine:1750]    Physical Exam:  General Appearance:Chronically ill appearing. In no acute distress.  HEENT: Head is atraumatic and normocephalic. Sclera anicteric without injection. Oropharyngeal membranes are moist with no erythema or exudate.  Neck: Deferred.  Lungs:  Normal work of breathing  Heart:  Tachycardiac  Abdomen: Nondistended.  Extremities: No clubbing, cyanosis, or edema.    Neurological Examination:     Mental Status: Alert, conversant, able to follow conversation and interview. Spontaneous speech was fluent without word finding pauses, dysarthria, or paraphasic errors. Comprehension was intact to simple and multi-step commands. Memory for recent and remote events was intact.    Cranial Nerves: Visual fields intact to direct confrontation.  PERRL.Pursuit eye movements were uninterrupted with full range and without more than end-gaze nystagmus. Facial sensation intact bilaterally to light touch on the forehead, cheek, and chin. Face symmetric at rest. Normal facial movement bilaterally, including forehead, eye closure and grimace/smile. Hearing intact to conversation. Shoulder shrug full strength bilaterally. Palate movement is symmetric. Tongue protrudes midline and tongue movements are normal.    Motor Exam: Normal bulk.  RUE tone on the Modified Ashworth Scale: 1 (slight increase in muscle tone, manifested by a catch and release or by minimal resistance at the end of the range of motion when the affect part is moved in flexion or extension).  LUE tone on the Modified Ashworth Scale: 1 (slight increase in muscle tone, manifested by a catch and release or by minimal resistance at the end of the range of motion when the affect part is moved in flexion or extension).  RLE tone on the Modified Ashworth Scale: 3 (considerable increase in muscle tone passive, movement difficult).  LLE tone on the Modified Ashworth Scale: 3 (considerable increase in muscle tone passive, movement difficult).  No tremors, myoclonus, or other adventitious movement.  Pronator drift is absent.  UE R/L: deltoid 4/4+, biceps 4/4+, triceps 4/4+, wrist extension 4+/5, finger spread 4+/5, and APB (median nerve) 4/5.  LE R/L: hip flexion 3/3, quadriceps 4-/4-, hamstrings 3/3, dorsiflexion 3/3, and plantar flexion 3/3.      Reflexes:   R L   Biceps +2 +2   Brachioradialis +2 +2   Triceps +2 +2   Patella +2 +2   Achilles +2 +2   Toes are downgoing bilaterally. No Hoffman's present bilaterally.    Sensory: Sensation normal to light touch and temperature sensation to cold in both hands and both feet and to vibration distally in the fingers and toes. Except diminished temperature sensation over R ulnar distribution below elbow and mild decreased vibratory sensation in bilateral toes.    Cerebellar/Coordination/Gait: Finger-to-nose is normal without ataxia or dysmetria bilaterally.          Motor Exam: Previously: Normal bulk. Normal tone in bilateral upper extremities. Increased tone in bilateral lower extremities without spasticity. Worse at ankles bilaterally than the knees and more normal at the hips.  RLE tone on the Modified Ashworth Scale: 2 (more marked increase in muscle tone through most of the ROM, but affected part easily moved).  LLE tone on the Modified Ashworth Scale: 3 (considerable increase in muscle tone passive, movement difficult).  Some give-way weakness throughout.  UE R/L: biceps 5/5, triceps 5/5, interossei 4+/4+, and hand grip mildly weak/strong   LE R/L: hip flexion 5-/5-, quadriceps 5-/5-, hamstrings 5-/5-, dorsiflexion 5/5, and plantar flexion 5/5.      Reflexes: Previously  R/L: biceps 2+/2+, brachioradialis 2+/2+, and patella 2+/3+ with 3-5 beats of clonus. Unable to obtain ankle reflexes given plantar flexion with limited ROM, no clear clonus within this limitation. No crossed adductors.      Sensory:  Intact to light touch     Cerebellar/Coordination/Gait: Finger-to-nose is normal without ataxia or dysmetria bilaterally.      Diagnostic Studies      All Labs Last 24hrs:   Recent Results (from the past 24 hour(s))   ECG 12 Lead    Collection Time: 10/19/22 11:22 AM   Result Value Ref Range    EKG Systolic BP  mmHg    EKG Diastolic BP  mmHg    EKG Ventricular Rate 85 BPM    EKG Atrial Rate  BPM    EKG P-R Interval  ms    EKG QRS Duration 180 ms    EKG Q-T Interval 456 ms    EKG QTC Calculation 542 ms    EKG Calculated P Axis  degrees    EKG Calculated R Axis -32 degrees    EKG Calculated T Axis -76 degrees    QTC Fredericia 512 ms   Folate Level    Collection Time: 10/19/22  9:46 PM   Result Value Ref Range    Folate 13.9 >=5.4 ng/mL   Basic Metabolic Panel    Collection Time: 10/20/22  6:54 AM   Result Value Ref Range  Sodium 140 135 - 145 mmol/L    Potassium 3.7 3.4 - 4.8 mmol/L    Chloride 106 98 - 107 mmol/L    CO2 29.0 20.0 - 31.0 mmol/L    Anion Gap 5 5 - 14 mmol/L    BUN 11 9 - 23 mg/dL    Creatinine 8.41 (L) 0.55 - 1.02 mg/dL    BUN/Creatinine Ratio 27     eGFR CKD-EPI (2021) Female >90 >=60 mL/min/1.11m2    Glucose 96 70 - 179 mg/dL    Calcium 8.8 8.7 - 32.4 mg/dL   CBC    Collection Time: 10/20/22  6:54 AM   Result Value Ref Range    WBC 9.7 3.6 - 11.2 10*9/L    RBC 3.45 (L) 3.95 - 5.13 10*12/L    HGB 9.1 (L) 11.3 - 14.9 g/dL    HCT 40.1 (L) 02.7 - 44.0 %    MCV 80.5 77.6 - 95.7 fL    MCH 26.3 25.9 - 32.4 pg    MCHC 32.7 32.0 - 36.0 g/dL    RDW 25.3 66.4 - 40.3 %    MPV 10.8 (H) 6.8 - 10.7 fL    Platelet 212 150 - 450 10*9/L   Magnesium Level    Collection Time: 10/20/22  6:54 AM   Result Value Ref Range    Magnesium 1.9 1.6 - 2.6 mg/dL   Phosphorus Level    Collection Time: 10/20/22  6:54 AM   Result Value Ref Range    Phosphorus 5.0 2.4 - 5.1 mg/dL   Triglycerides    Collection Time: 10/20/22  6:54 AM   Result Value Ref Range    Triglycerides 128 0 - 150 mg/dL   Hepatic Function Panel    Collection Time: 10/20/22  6:54 AM   Result Value Ref Range    Albumin 3.3 (L) 3.4 - 5.0 g/dL Total Protein 5.7 5.7 - 8.2 g/dL    Total Bilirubin <4.7 (L) 0.3 - 1.2 mg/dL    Bilirubin, Direct <4.25 0.00 - 0.30 mg/dL    AST 956 (H) <=38 U/L    ALT 430 (H) 10 - 49 U/L    Alkaline Phosphatase 102 46 - 116 U/L       MRI CTL Spine w/wo, 09/26/2022, personally reviewed:  Mild spinal canal stenosis noted at C4-C6. No abnormal enhancement.

## 2022-10-20 NOTE — Unmapped (Signed)
Hospital Medicine Daily Progress Note    Assessment/Plan:    Principal Problem:    Other acute postprocedural pain  Active Problems:    [name]    Intestinal polyps    [name]    Neoplasm related pain    Physical deconditioning    History of colectomy    Intractable abdominal pain    Generalized anxiety disorder with panic attacks    SBO (small bowel obstruction) (CMS-HCC)    Small intestinal bacterial overgrowth (SIBO)  Resolved Problems:    * No resolved hospital problems. 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    Chronic abdominal pain, nausea 2/2 desmoid tumors and prior surgeries  Ileus, multifactorial but with component of narcotic bowel - resolved  Inability to tolerate PO, without diagnosis of malnutrition - improving  Ileus at this point seems resolved, though patient still struggling with some ongoing nausea. Having small consistent bowel movements with very rare need for intermittent suction. Nausea is improved but certainly not resolved, and she is working on increasing the volume and textures of solid intake.   PLAN  - Appreciate chronic pain recs  - SLP following, regular diet recommended  - Continuing to encourage swallowing pills. Speech to reassess this week  - Monitor Qtc every 2-3 days; may need to dc zofran  - NG tube clamped  - Continue TPN for nutritional support, reorder daily  - Continue bowel regimen with BID suppository, miralax daily  - Limit opiates as able    Peristalsis in RLQ  Patient and family concerned. It is very pronounced (like a baby kicking/bag of worms). Pain/cramping is ongoing and was severe last night. She is eating small amounts, moving bowels, passing flatus. Vitals normal. They are concerned this could be an early sign of mechanical obstruction/partial obstruction.   PLAN  - Will discuss with GI/surgery; anticipate they will recommend continued close monitoring  - If she develops any distension or worsening nausea, vomiting will obtain repeat labs/imaging (would start with xray to assess gas pattern, consider CT a/p with contrast to evaluate for a transition point)    Muscle rigidity and spasms - severe  Occurred post-procedurally, with reassuring evaluation (MRI spine, neuro c/s, CK normal) at that time. Repeat CK on 8/6 was also normal. Rigidity remains quite severe and I am concerned that it is not improving. It is also unclear if her worsening weakness is 2/2 deconditioning or could be related.  PLAN  - Appreciate chronic pain recommendations  - Consulted PM&R, appreciate assistance  - Discussed with neurology by phone today and they will re-evaluate her in person, waiting recs  - Baclofen 15 mg TID; monitor for side effects  - Continue ativan TID prn (PTA med), but caution against escalating    Syncope and pre-syncope, uncertain etiology  Possibly vasovagal vs adverse effect of medications vs deconditioning vs iron deficiency. Has improved with weaning opioids but still occurring, last incident 8/6 (LOC per nurse, witnessed). Previously worked up for PE, with negative CTA. TTE normal. Cosyntropin stim test and thyroid studies  normal. Remains in sinus without events (pauses) on telemetry.  PLAN  - Limit polypharmacy  - S/p IV iron    Iron deficiency anemia  Ferritin 5, iron saturation 6%. Has had prior reactions with numerous attempts to give IV iron.   - S/p Feraheme 1020 mg. Received pre-med protocol per A&I  - Would not give IV iron except when it cannot be avoided given severe itching/stress it causes her  - Continues to have itching but have switched to oral benadryl, please avoid IV    Transaminitis - resolving    Headaches  Minimal improvement with IV fluids, magnesium, IV Benadryl. Concerning for rebound/medication overuse headache. Headache has improved with Tylenol holiday and ketamine though not completely resolved. Though her chemotherapy also has headaches as a common side effect.  PLAN  - Enteral PRN tylenol, fioricet daily prn could be considered if remains severe  - Avoid NSAIDs, avoid IV Benadryl    Desmoid tumors - [name]  - Restarting nirogacestat at lower dose of 50 mg BID on 8/8 after discussion with primary oncologist Dr. [name]    Chronic/stable/resolved problems:    SIBO: Hold home cefdinir, doxy    FEN: Encourage regular diet, TPN  VTE ppx: SCDs ordered, mobilize as able  Code status: Full code  Dispo: Home with HH PT/OT/TPN vs SNF pending ability to stand/walk and removal of NG tube    I personally spent 75 minutes face-to-face and non-face-to-face in the care of this patient, which includes all pre, intra, and post visit time on the date of service. This included discussion with consultants and extended counseling/discussion with patient and her family bedside. All documented time was specific to the E/M visit and does not include any procedures that may have been performed.    Chauncy Lean, MD  Hospital Medicine   ___________________________________________________________________    Subjective:  Increased bowel movement/pain due to tethering sensation/cramping. Lot of pain overnight. Moving bowels. Eating a few bites at a time, drinking fluids. Not ready to remove NG tube just yet. Weakness is severe, rigidity remains pronounced.     Labs/Studies:  Labs per EMR and Reviewed (last 24hrs)    Objective:  BP 121/60  - Pulse 96  - Temp 36.5 ??C (97.7 ??F) (Oral)  - Resp 17  - Ht 165.1 cm (5' 5)  - Wt 60.2 kg (132 lb 11.5 oz)  - SpO2 99%  - BMI 22.09 kg/m??     GEN: No acute distress, resting, family present bedside  HEENT: NG tube in place, pale conjunctiva, MMM, normal hearing  CV: Normal rate  EXT: Grossly symmetric, very mild swelling in bilateral feet but not legs  SKIN: No rashes or erythema  NEURO: Muscle rigidity/increased tone in lower extremities, generalized ~4/5 weakness  PSYCH: Appropriately anxious, cooperative, good insight

## 2022-10-21 LAB — CBC
HEMATOCRIT: 29.2 % — ABNORMAL LOW (ref 34.0–44.0)
HEMOGLOBIN: 9.9 g/dL — ABNORMAL LOW (ref 11.3–14.9)
MEAN CORPUSCULAR HEMOGLOBIN CONC: 33.9 g/dL (ref 32.0–36.0)
MEAN CORPUSCULAR HEMOGLOBIN: 27.1 pg (ref 25.9–32.4)
MEAN CORPUSCULAR VOLUME: 80 fL (ref 77.6–95.7)
MEAN PLATELET VOLUME: 10.6 fL (ref 6.8–10.7)
PLATELET COUNT: 194 10*9/L (ref 150–450)
RED BLOOD CELL COUNT: 3.65 10*12/L — ABNORMAL LOW (ref 3.95–5.13)
RED CELL DISTRIBUTION WIDTH: 13.8 % (ref 12.2–15.2)
WBC ADJUSTED: 8.9 10*9/L (ref 3.6–11.2)

## 2022-10-21 LAB — HEPATIC FUNCTION PANEL
ALBUMIN: 3.3 g/dL — ABNORMAL LOW (ref 3.4–5.0)
ALKALINE PHOSPHATASE: 84 U/L (ref 46–116)
ALT (SGPT): 220 U/L — ABNORMAL HIGH (ref 10–49)
AST (SGOT): 62 U/L — ABNORMAL HIGH (ref <=34)
BILIRUBIN DIRECT: <0.1 mg/dL (ref 0.00–0.30)
BILIRUBIN TOTAL: <0.2 mg/dL — ABNORMAL LOW (ref 0.3–1.2)
PROTEIN TOTAL: 6 g/dL (ref 5.7–8.2)

## 2022-10-21 LAB — PHOSPHORUS: PHOSPHORUS: 4.7 mg/dL (ref 2.4–5.1)

## 2022-10-21 LAB — BASIC METABOLIC PANEL
ANION GAP: 6 mmol/L (ref 5–14)
BLOOD UREA NITROGEN: 12 mg/dL (ref 9–23)
BUN / CREAT RATIO: 26
CALCIUM: 8.7 mg/dL (ref 8.7–10.4)
CHLORIDE: 106 mmol/L (ref 98–107)
CO2: 27 mmol/L (ref 20.0–31.0)
CREATININE: 0.47 mg/dL — ABNORMAL LOW
EGFR CKD-EPI (2021) FEMALE: >90 mL/min/{1.73_m2} (ref >=60)
GLUCOSE RANDOM: 101 mg/dL (ref 70–179)
POTASSIUM: 3.8 mmol/L (ref 3.4–4.8)
SODIUM: 139 mmol/L (ref 135–145)

## 2022-10-21 LAB — ZINC: ZINC: 55 ug/dL — ABNORMAL LOW

## 2022-10-21 LAB — MAGNESIUM: MAGNESIUM: 1.8 mg/dL (ref 1.6–2.6)

## 2022-10-21 LAB — COPPER, SERUM: COPPER: 90 ug/dL

## 2022-10-21 MED ADMIN — baclofen (LIORESAL) tablet 15 mg: 15 mg | ORAL | @ 13:00:00

## 2022-10-21 MED ADMIN — acetaminophen (TYLENOL) tablet 650 mg: 650 mg | GASTROENTERAL | @ 10:00:00

## 2022-10-21 MED ADMIN — HYDROmorphone (PF) (DILAUDID) injection 1 mg: 1 mg | INTRAVENOUS | @ 20:00:00 | Stop: 2022-11-04

## 2022-10-21 MED ADMIN — lactated Ringers infusion: 100 mL/h | INTRAVENOUS | @ 23:00:00

## 2022-10-21 MED ADMIN — HYDROmorphone (PF) injection Syrg 0.5 mg: .5 mg | INTRAVENOUS | @ 12:00:00 | Stop: 2022-10-21

## 2022-10-21 MED ADMIN — HYDROmorphone (PF) injection Syrg 0.5 mg: .5 mg | INTRAVENOUS | @ 13:00:00 | Stop: 2022-10-21

## 2022-10-21 MED ADMIN — cetirizine (ZYRTEC) tablet 20 mg: 20 mg | GASTROENTERAL | @ 01:00:00

## 2022-10-21 MED ADMIN — promethazine (PHENERGAN) 6.5 mg in sodium chloride (NS) 0.9 % 50 mL IVPB: 6.5 mg | INTRAVENOUS | @ 07:00:00

## 2022-10-21 MED ADMIN — ondansetron (ZOFRAN) injection 4 mg: 4 mg | INTRAVENOUS | @ 01:00:00

## 2022-10-21 MED ADMIN — nirogacestat (OGSIVEO) tablet 50 mg *Patient Supplied*: 50 mg | ORAL | @ 01:00:00

## 2022-10-21 MED ADMIN — pantoprazole (Protonix) injection 40 mg: 40 mg | INTRAVENOUS | @ 13:00:00

## 2022-10-21 MED ADMIN — iohexol (OMNIPAQUE) 240 mg iodine/mL oral solution 50 mL: 50 mL | ORAL | @ 14:00:00 | Stop: 2022-10-21

## 2022-10-21 MED ADMIN — LORazepam (ATIVAN) injection 1 mg: 1 mg | INTRAVENOUS | @ 15:00:00 | Stop: 2022-10-21

## 2022-10-21 MED ADMIN — buprenorphine-naloxone (SUBOXONE) 2-0.5 mg SL film 0.5 mg of buprenorphine: .25 | SUBLINGUAL | @ 18:00:00

## 2022-10-21 MED ADMIN — buprenorphine-naloxone (SUBOXONE) 2-0.5 mg SL film 0.5 mg of buprenorphine: .25 | SUBLINGUAL | @ 01:00:00

## 2022-10-21 MED ADMIN — acetaminophen (TYLENOL) tablet 650 mg: 650 mg | GASTROENTERAL | @ 22:00:00

## 2022-10-21 MED ADMIN — famotidine (PF) (PEPCID) injection 20 mg: 20 mg | INTRAVENOUS | @ 03:00:00

## 2022-10-21 MED ADMIN — HYDROmorphone (PF) (DILAUDID) injection 1 mg: 1 mg | INTRAVENOUS | @ 22:00:00 | Stop: 2022-11-04

## 2022-10-21 MED ADMIN — baclofen (LIORESAL) tablet 15 mg: 15 mg | ORAL | @ 01:00:00

## 2022-10-21 MED ADMIN — fat emulsion 20 % with fish oil (SMOFlipid) infusion 250 mL: 250 mL | INTRAVENOUS | @ 03:00:00 | Stop: 2022-10-21

## 2022-10-21 MED ADMIN — senna (SENOKOT) tablet 2 tablet: 2 | ORAL | @ 01:00:00

## 2022-10-21 MED ADMIN — iohexol (OMNIPAQUE) 350 mg iodine/mL solution 100 mL: 100 mL | INTRAVENOUS | @ 16:00:00 | Stop: 2022-10-21

## 2022-10-21 MED ADMIN — HYDROmorphone (PF) injection Syrg 0.5 mg: .5 mg | INTRAVENOUS | @ 18:00:00 | Stop: 2022-10-21

## 2022-10-21 MED ADMIN — ondansetron (ZOFRAN) injection 4 mg: 4 mg | INTRAVENOUS | @ 12:00:00

## 2022-10-21 MED ADMIN — oxyCODONE (ROXICODONE) immediate release tablet 10 mg: 10 mg | ORAL | @ 05:00:00 | Stop: 2022-10-25

## 2022-10-21 MED ADMIN — HYDROmorphone (PF) injection Syrg 0.5 mg: .5 mg | INTRAVENOUS | @ 03:00:00 | Stop: 2022-10-20

## 2022-10-21 MED ADMIN — buprenorphine-naloxone (SUBOXONE) 2-0.5 mg SL film 0.5 mg of buprenorphine: .25 | SUBLINGUAL | @ 13:00:00

## 2022-10-21 MED ADMIN — Parenteral Nutrition (CENTRAL): INTRAVENOUS | @ 03:00:00 | Stop: 2022-10-21

## 2022-10-21 MED ADMIN — acetaminophen (TYLENOL) tablet 650 mg: 650 mg | GASTROENTERAL | @ 05:00:00

## 2022-10-21 MED ADMIN — pantoprazole (Protonix) injection 40 mg: 40 mg | INTRAVENOUS | @ 03:00:00

## 2022-10-21 MED ADMIN — lactated Ringers infusion: 100 mL/h | INTRAVENOUS | @ 13:00:00

## 2022-10-21 MED ADMIN — ondansetron (ZOFRAN) injection 4 mg: 4 mg | INTRAVENOUS | @ 23:00:00

## 2022-10-21 MED ADMIN — mirtazapine (REMERON) tablet 15 mg: 15 mg | GASTROENTERAL | @ 03:00:00

## 2022-10-21 MED ADMIN — HYDROmorphone (PF) injection Syrg 0.5 mg: .5 mg | INTRAVENOUS | @ 17:00:00 | Stop: 2022-10-21

## 2022-10-21 MED ADMIN — LORazepam (ATIVAN) tablet 0.5 mg: .5 mg | ORAL | @ 01:00:00

## 2022-10-21 MED ADMIN — promethazine (PHENERGAN) 6.5 mg in sodium chloride (NS) 0.9 % 50 mL IVPB: 6.5 mg | INTRAVENOUS | @ 14:00:00

## 2022-10-21 NOTE — Unmapped (Signed)
ICU TRANSPORT NOTE    Destination: [facility]    Departing Unit: [facility]  Pickup Time: 1150    Return Unit: [facility]  Return Time: 1230    Arp patient ID band verified  Allergies Reviewed  Code Status at time of transport: Full    Report received from primary nurse via [facility]. Handoff performed . Patient transported via stretcher under stepdown level of care. See vital signs during transport via [employer]. O2 via [place] @ 2 L. Patient is alert and following commands. Patient tolerated procedure/scan well. Universal precautions maintained throughout transport.    Update and care given to primary nurse. See Doc Flowsheets/MAR for additional transportation documentation. Proper body mechanics and safe patient handling equipment were utilized throughout transport.

## 2022-10-21 NOTE — Unmapped (Signed)
Lower GI Surgery Consult Note    Requesting Attending Physician:  Suzzanne Cloud, MD  Service Requesting Consult:  [facility])  Service Providing Consult: Lower GI Surgery  Consulting Attending: Dr. Clovis Cao    Assessment:  [name] [name] is a 22 y.o. 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 CT shows no small bowel obstruction or intra-abdominal cause of her fever and there is no stricture on her exam which could be contributing to her J-pouch dilation. Her small bowel dilation appears to be functional in nature.    PLAN:   - No acute surgical intervention is indicated at this time.   - Fever workup per primary team  - Recommend GI consultation for ileus  - We will sign off at this time, please call with any questions.    If you have any questions, concerns or changes in the patient's clinical status, please feel free to contact [facility] consult pager [phone].      History of Present Illness:   Chief Complaint:  Abdominal pain    [name] [name] is a 22 y.o. female who is seen in consultation for abdominal pain at the request of Suzzanne Cloud, MD on the [facility]) service.     [name] [name] is 22 y.o. 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 Over the past 24 hours, she endorses worsening of her baseline abdominal pain and obstipation, and has had fever with Tmax 101.7 and tachycardia to the 130s since this AM.    Pouchoscopy on 07/16/22 demonstrated small uclerations on the suture line, and a rectal cuff beginning 1cm from the anal verge and extending 2cm, with healthy appearing mucosa. Resected polyps showed adenomatous pathology without dysplasia. Prior December 2023 pouchoscopy showed 1cm of circumferential ulceration with severe, active pouchitis which was much improved ~2 months later.    She has been on TPN from 09/28/2022 via a nontunneled right IJ central line which has been present since November 2023. She was recently on intermittent, supplemental home TPN for 6 months but stopped 2 weeks prior to admission.  She was also on TPN perioperatively for her proctocolectomy and for several months December 2022 to April 2023.      Past Medical History:   Past Medical History:   Diagnosis Date    Abdominal pain     Acid reflux     occas    Anesthesia complication     itching, shaking, coldness; last few surgeries have gone much better    Cancer (CMS-HCC)     Cataract of right eye     COVID-19 virus infection 01/2019    Cyst of thyroid determined by ultrasound     monitoring    [name]     2 right forearm, 1 left thigh, 1 right scapula, 1 under left clavicle; multiple    Difficult intravenous access     [name] (familial adenomatous polyposis)     [name]     Gastric polyps     History of chemotherapy     last treatment approx 05/2019    History of  colon polyps     History of COVID-19 01/2019    Iron deficiency anemia due to chronic blood loss     received iron infusion 11-2019    PONV (postoperative nausea and vomiting)     Rectal bleeding     Syncopal episodes     especially if becoming dehydrated       Past Surgical History:  Past Surgical History:   Procedure Laterality Date    COLON SURGERY      cyroablation      cystis removal      desmoid removal      PR CLOSE ENTEROSTOMY,RESEC+ANAST N/A 10/09/2020    Procedure: ILEOSTOMY TAKEDOWN;  Surgeon: Mickle Asper, MD;  Location: OR [place];  Service: General Surgery    PR COLONOSCOPY W/BIOPSY SINGLE/MULTIPLE N/A 10/27/2012    Procedure: COLONOSCOPY, FLEXIBLE, PROXIMAL TO SPLENIC FLEXURE; WITH BIOPSY, SINGLE OR MULTIPLE;  Surgeon: Shirlyn Goltz Mir, MD;  Location: PEDS PROCEDURE ROOM [facility];  Service: Gastroenterology    PR COLONOSCOPY W/BIOPSY SINGLE/MULTIPLE N/A 09/14/2013    Procedure: COLONOSCOPY, FLEXIBLE, PROXIMAL TO SPLENIC FLEXURE; WITH BIOPSY, SINGLE OR MULTIPLE;  Surgeon: Shirlyn Goltz Mir, MD;  Location: PEDS PROCEDURE ROOM [place];  Service: Gastroenterology    PR COLONOSCOPY W/BIOPSY SINGLE/MULTIPLE N/A 11/08/2014    Procedure: COLONOSCOPY, FLEXIBLE, PROXIMAL TO SPLENIC FLEXURE; WITH BIOPSY, SINGLE OR MULTIPLE;  Surgeon: Arnold Long Mir, MD;  Location: PEDS PROCEDURE ROOM [name];  Service: Gastroenterology    PR COLONOSCOPY W/BIOPSY SINGLE/MULTIPLE N/A 12/26/2015    Procedure: COLONOSCOPY, FLEXIBLE, PROXIMAL TO SPLENIC FLEXURE; WITH BIOPSY, SINGLE OR MULTIPLE;  Surgeon: Arnold Long Mir, MD;  Location: PEDS PROCEDURE ROOM [facility];  Service: Gastroenterology    PR COLONOSCOPY W/BIOPSY SINGLE/MULTIPLE N/A 09/02/2017    Procedure: COLONOSCOPY, FLEXIBLE, PROXIMAL TO SPLENIC FLEXURE; WITH BIOPSY, SINGLE OR MULTIPLE;  Surgeon: Arnold Long Mir, MD;  Location: PEDS PROCEDURE ROOM [place];  Service: Gastroenterology    PR COLSC FLX W/REMOVAL LESION BY HOT BX FORCEPS N/A 08/27/2016    Procedure: COLONOSCOPY, FLEXIBLE, PROXIMAL TO SPLENIC FLEXURE; W/REMOVAL TUMOR/POLYP/OTHER LESION, HOT BX FORCEP/CAUTE;  Surgeon: Arnold Long Mir, MD;  Location: PEDS PROCEDURE ROOM [facility];  Service: Gastroenterology    PR COLSC FLX W/RMVL OF TUMOR POLYP LESION SNARE TQ N/A 02/25/2019    Procedure: COLONOSCOPY FLEX; W/REMOV TUMOR/LES BY SNARE;  Surgeon: Helyn Numbers, MD;  Location: GI PROCEDURES [place] [facility];  Service: Gastroenterology    PR COLSC FLX W/RMVL OF TUMOR POLYP LESION SNARE TQ N/A 03/13/2020    Procedure: COLONOSCOPY FLEX; W/REMOV TUMOR/LES BY SNARE;  Surgeon: Helyn Numbers, MD;  Location: [facility] PROCEDURES [place] [facility];  Service: Gastroenterology    PR EXC SKIN BENIG 2.1-3 CM TRUNK,ARM,LEG Right 02/25/2017    Procedure: EXCISION, BENIGN LESION INCLUDE MARGINS, EXCEPT SKIN TAG, LEGS; EXCISED DIAMETER 2.1 TO 3.0 CM;  Surgeon: Clarene Duke, MD;  Location: CHILDRENS OR [facility];  Service: Plastics    PR EXC SKIN BENIG 3.1-4 CM TRUNK,ARM,LEG Right 02/25/2017    Procedure: EXCISION, BENIGN LESION INCLUDE MARGINS, EXCEPT SKIN TAG, ARMS; EXCISED DIAMETER 3.1 TO 4.0 CM;  Surgeon: Clarene Duke, MD;  Location: CHILDRENS OR [facility];  Service: Plastics    PR EXC SKIN BENIG >4 CM FACE,FACIAL Right 02/25/2017    Procedure: EXCISION, OTHER BENIGN LES INCLUD MARGIN, FACE/EARS/EYELIDS/NOSE/LIPS/MUCOUS MEMBRANE; EXCISED DIAM >4.0 CM;  Surgeon: Clarene Duke, MD;  Location: CHILDRENS OR [facility];  Service: Plastics    PR EXC TUMOR SOFT TISSUE LEG/ANKLE SUBQ 3+CM Right 08/05/2019    Procedure: EXCISION,  TUMOR, SOFT TISSUE OF LEG OR ANKLE AREA, SUBCUTANEOUS; 3 CM OR GREATER;  Surgeon: Arsenio Katz, MD;  Location: MAIN OR [place];  Service: Plastics    PR EXC TUMOR SOFT TISSUE LEG/ANKLE SUBQ <3CM Right 08/05/2019    Procedure: EXCISION, TUMOR, SOFT TISSUE OF LEG OR ANKLE AREA, SUBCUTANEOUS; LESS THAN 3 CM;  Surgeon: Arsenio Katz, MD;  Location: MAIN OR [facility];  Service: Plastics    PR LAP, SURG PROCTECTOMY W J-POUCH N/A 08/10/2020    Procedure: ROBOTIC ASSISTED LAPAROSCOPIC PROCTOCOLECTOMY, ILEAL J POUCH, WITH OSTOMY;  Surgeon: Mickle Asper, MD;  Location: OR [place];  Service: General Surgery    PR NDSC EVAL INTSTINAL POUCH DX W/COLLJ SPEC SPX N/A 01/23/2021    Procedure: ENDO EVAL SM INTEST POUCH; DX;  Surgeon: Modena Nunnery, MD;  Location: [facility] PROCEDURES [place] [facility];  Service: Gastroenterology    PR NDSC EVAL INTSTINAL POUCH DX W/COLLJ SPEC SPX N/A 08/27/2021    Procedure: ENDO EVAL SM INTEST POUCH; DX;  Surgeon: Hunt Oris, MD;  Location: GI PROCEDURES [facility];  Service: Gastroenterology    PR NDSC EVAL INTSTINAL POUCH DX W/COLLJ SPEC SPX N/A 12/09/2021    Procedure: ENDO EVAL SM INTEST POUCH; DX;  Surgeon: Vidal Schwalbe, MD;  Location: GI PROCEDURES [facility];  Service: Gastroenterology    PR NDSC EVAL INTSTINAL POUCH DX W/COLLJ [facility] SPX Left 04/09/2022    Procedure: ENDO EVAL SM INTEST POUCH; DX;  Surgeon: Modena Nunnery, MD;  Location: [facility] PROCEDURES [place] [facility];  Service: Gastroenterology    PR NDSC EVAL INTSTINAL POUCH DX W/COLLJ SPEC SPX N/A 08/05/2022    Procedure: ENDO EVAL SM INTEST POUCH; DX;  Surgeon: Modena Nunnery, MD;  Location: GI PROCEDURES [facility];  Service: Gastroenterology    PR NDSC EVAL INTSTINAL POUCH W/BX SINGLE/MULTIPLE N/A 01/20/2022    Procedure: ENDOSCOPIC EVAL OF SMALL INTESTINAL POUCH; DIAGNOSTIC, No biopsies;  Surgeon: Andrey Farmer, MD;  Location: GI PROCEDURES [facility];  Service: Gastroenterology    PR NDSC EVAL INTSTINAL POUCH W/BX SINGLE/MULTIPLE N/A 02/13/2022    Procedure: ENDOSCOPIC EVAL OF SMALL INTESTINAL POUCH; DIAGNOSTIC, WITH BIOPSY;  Surgeon: Bronson Curb, MD;  Location: GI PROCEDURES [facility];  Service: Gastroenterology    PR UNLISTED PROCEDURE SMALL INTESTINE  01/23/2021    Procedure: UNLISTED PROCEDURE, SMALL INTESTINE;  Surgeon: Modena Nunnery, MD;  Location: [facility] PROCEDURES [place] [facility];  Service: Gastroenterology    PR UNLISTED PROCEDURE SMALL INTESTINE  02/13/2022    Procedure: UNLISTED PROCEDURE, SMALL INTESTINE;  Surgeon: Bronson Curb, MD;  Location: GI PROCEDURES [facility] [place];  Service: Gastroenterology    PR UPPER GI ENDOSCOPY,BIOPSY N/A 10/27/2012    Procedure: UGI ENDOSCOPY; WITH BIOPSY, SINGLE OR MULTIPLE;  Surgeon: Shirlyn Goltz Mir, MD;  Location: PEDS PROCEDURE ROOM [facility];  Service: Gastroenterology    PR UPPER GI ENDOSCOPY,BIOPSY N/A 09/14/2013    Procedure: UGI ENDOSCOPY; WITH BIOPSY, SINGLE OR MULTIPLE;  Surgeon: Shirlyn Goltz Mir, MD;  Location: PEDS PROCEDURE ROOM [facility];  Service: Gastroenterology    PR UPPER GI ENDOSCOPY,BIOPSY N/A 11/08/2014    Procedure: UGI ENDOSCOPY; WITH BIOPSY, SINGLE OR MULTIPLE;  Surgeon: Arnold Long Mir, MD;  Location: PEDS PROCEDURE ROOM [facility];  Service: Gastroenterology    PR UPPER GI ENDOSCOPY,BIOPSY N/A 12/26/2015    Procedure: UGI ENDOSCOPY; WITH BIOPSY, SINGLE OR MULTIPLE;  Surgeon: Arnold Long Mir, MD;  Location: PEDS PROCEDURE RO[facility];  Service: Gastroenterology    PR UPPER GI ENDOSCOPY,BIOPSY N/A 08/27/2016  Procedure: UGI ENDOSCOPY; WITH BIOPSY, SINGLE OR MULTIPLE;  Surgeon: Arnold Long Mir, MD;  Location: PEDS PROCEDURE ROOM [facility];  Service: Gastroenterology    PR UPPER GI ENDOSCOPY,BIOPSY N/A 09/02/2017    Procedure: UGI ENDOSCOPY; WITH BIOPSY, SINGLE OR MULTIPLE;  Surgeon: Arnold Long Mir, MD;  Location: PEDS PROCEDURE ROOM [facility];  Service: Gastroenterology    PR UPPER GI ENDOSCOPY,BIOPSY N/A 03/13/2020    Procedure: UGI ENDOSCOPY; WITH BIOPSY, SINGLE OR MULTIPLE;  Surgeon: Helyn Numbers, MD;  Location: [facility] PROCEDURES [place] [facility];  Service: Gastroenterology    PR UPPER GI ENDOSCOPY,BIOPSY N/A 09/05/2021    Procedure: UGI ENDOSCOPY; WITH BIOPSY, SINGLE OR MULTIPLE;  Surgeon: Wendall Papa, MD;  Location: GI PROCEDURES [facility];  Service: Gastroenterology    PR UPPER GI ENDOSCOPY,DIAGNOSIS N/A 01/20/2022    Procedure: UGI ENDO, INCLUDE ESOPHAGUS, STOMACH, & DUODENUM &/OR JEJUNUM; DX W/WO COLLECTION SPECIMN, BY BRUSH OR WASH;  Surgeon: Andrey Farmer, MD;  Location: GI PROCEDURES [facility];  Service: Gastroenterology    TUMOR REMOVAL      multiple-head, neck, back, hand, right flank, multiple       Medications:  Current Facility-Administered Medications   Medication Dose Route Frequency Provider Last Rate Last Admin    acetaminophen (TYLENOL) tablet 650 mg  650 mg Enteral tube: gastric Q6H PRN Suzzanne Cloud, MD   650 mg at 10/21/22 0612    aluminum-magnesium hydroxide-simethicone (MAALOX PLUS) 200-200-20 mg/5 mL suspension 60 mL  60 mL Oral Q6H PRN Suzzanne Cloud, MD        baclofen (LIORESAL) tablet 15 mg  15 mg Oral TID Suzzanne Cloud, MD   15 mg at 10/21/22 0856    bisacodyl (DULCOLAX) suppository 10 mg  10 mg Rectal BID Suzzanne Cloud, MD   10 mg at 10/14/22 2055    buprenorphine-naloxone (SUBOXONE) 2-0.5 mg SL film 0.5 mg of buprenorphine  0.25 Film Sublingual TID Lambert Mody, MD PhD   0.5 mg of buprenorphine at 10/21/22 1428    calcium carbonate (TUMS) chewable tablet 400 mg elem calcium  400 mg elem calcium Oral Daily PRN Terri Piedra, AGNP        [START ON 10/22/2022] cetirizine (ZYRTEC) tablet 10 mg  10 mg Enteral tube: gastric Daily Suzzanne Cloud, MD        CHEMO CLARIFICATION ORDER   Other Continuous PRN Lestine Box, PharmD        diphenhydrAMINE (BENADRYL) capsule 50 mg  50 mg Oral Q6H PRN Suzzanne Cloud, MD   50 mg at 10/19/22 1536    famotidine (PF) (PEPCID) injection 20 mg  20 mg Intravenous At bedtime Rocco Serene, MD   20 mg at 10/20/22 2238    fat emulsion 20 % (INTRAlipid) infusion 100 mL  100 mL Intravenous Once PRN Lobonc, Ammie Ferrier, MD        fat emulsion 20 % (INTRAlipid) infusion 200 mL  200 mL Intravenous Once PRN Lobonc, Ammie Ferrier, MD        guaiFENesin (ROBITUSSIN) oral syrup  200 mg Oral Q4H PRN Kateri Plummer, MD   200 mg at 10/17/22 2344    HYDROmorphone (PF) (DILAUDID) injection 1 mg  1 mg Intravenous Q3H PRN Suzzanne Cloud, MD   1 mg at 10/21/22 1549    IP OKAY TO TREAT   Other Continuous PRN Sheral Apley, MD        lactated Ringers infusion  100 mL/hr Intravenous Continuous Maurice March,  Juleen China, MD 100 mL/hr at 10/21/22 0856 100 mL/hr at 10/21/22 0856    LORazepam (ATIVAN) tablet 0.5 mg  0.5 mg Oral TID PRN Lambert Mody, MD PhD   0.5 mg at 10/20/22 2044    mirtazapine (REMERON) tablet 15 mg  15 mg Enteral tube: gastric Nightly Kateri Plummer, MD   15 mg at 10/20/22 2239    naloxegol (MOVANTIK) tablet 25 mg  25 mg Oral Daily Suzzanne Cloud, MD   25 mg at 10/20/22 0820 [Provider Hold] nirogacestat (OGSIVEO) tablet 50 mg *Patient Supplied*  50 mg Oral BID Sheral Apley, MD   50 mg at 10/20/22 2035    ondansetron (ZOFRAN) injection 4 mg  4 mg Intravenous Q12H PRN Suzzanne Cloud, MD   4 mg at 10/21/22 0739    oxyCODONE (ROXICODONE) immediate release tablet 10 mg  10 mg Oral Q4H PRN Suzzanne Cloud, MD   10 mg at 10/21/22 0053    pantoprazole (Protonix) injection 40 mg  40 mg Intravenous BID Rocco Serene, MD   40 mg at 10/21/22 0855    phenol (CHLORASEPTIC) 1.4 % spray 2 spray  2 spray Mucous Membrane Q2H PRN Rocco Serene, MD   2 spray at 10/03/22 0138    polyethylene glycol (MIRALAX) packet 17 g  17 g Oral Daily PRN Suzzanne Cloud, MD        polyethylene glycol [facility]) packet 17 g  17 g Oral BID Suzzanne Cloud, MD        promethazine (PHENERGAN) 6.5 mg in sodium chloride (NS) 0.9 % 50 mL IVPB  6.5 mg Intravenous Q6H PRN Suzzanne Cloud, MD   Stopped at 10/21/22 1010    senna (SENOKOT) tablet 2 tablet  2 tablet Oral Nightly Suzzanne Cloud, MD   2 tablet at 10/20/22 2036    sodium chloride (NS) 0.9 % infusion  10 mL/hr Intravenous Continuous Nat Christen., MD        zinc oxide-cod liver oil (DESITIN 40%) Paste   Topical Daily PRN Arman Filter, MD   Given at 10/13/22 1015       Allergies:  Adhesive tape-silicones; Ferrlecit [sodium ferric gluconat-sucrose]; Levofloxacin; Methylnaltrexone; Neomycin; Papaya; Morphine; Zosyn [piperacillin-tazobactam]; Compazine [prochlorperazine]; Iron analogues; Iron dextran; and Latex, natural rubber    Family History:  The patient's family history includes Alcohol abuse in her paternal grandfather; Arthritis in her maternal grandfather; Asthma in her maternal grandfather; COPD in her paternal grandmother; Cancer in her maternal grandmother; Diabetes in her maternal grandmother; Hypertension in her maternal grandmother; Miscarriages / Stillbirths in her paternal grandmother; No Known Problems in her brother, father, maternal aunt, maternal uncle, mother, paternal aunt, paternal uncle, and sister; Other in her maternal grandmother; Stroke in her maternal grandmother; Thyroid disease in her maternal grandmother..    Review of Systems  A 12 system review of systems was negative except as noted in HPI    Objective  Vitals:   Temp:  [36.7 ??C (98.1 ??F)-38.7 ??C (101.7 ??F)] 37.6 ??C (99.7 ??F)  Heart Rate:  [97-125] 97  SpO2 Pulse:  [97-122] 97  Resp:  [13-20] 13  BP: (108-121)/(60-68) 118/62  MAP (mmHg):  [74-81] 79  SpO2:  [99 %-100 %] 99 %      Intake/Output last 24 hours:    Intake/Output Summary (Last 24 hours) at 10/21/2022 1159  Last data filed at 10/21/2022 0815  Gross per 24 hour   Intake 2340.46 ml  Output 4150 ml   Net -1809.54 ml       Physical Exam:    General: Well appearing, well-nourished in mild acute distress  Eyes: Sclera anicteric, EOM intact  ENT: Nares without discharge, NGT in place  Cardiac: Sinus tachycardia  Pulmonary: Non labored breathing, stable on RA  Abdomen: Soft, moderately tender to RLQ palpation, moderately distended, no peritoneal signs  Rectal: Patent ileoanal anastamosis without stricture  Extremities: Warm and well perfused, no edema bilaterally   Neuro: Alert and oriented x3    Pertinent Diagnostic Tests:  All lab results last 24 hours:    Recent Results (from the past 24 hour(s))   Basic Metabolic Panel    Collection Time: 10/21/22  6:20 AM   Result Value Ref Range    Sodium 139 135 - 145 mmol/L    Potassium 3.8 3.4 - 4.8 mmol/L    Chloride 106 98 - 107 mmol/L    CO2 27.0 20.0 - 31.0 mmol/L    Anion Gap 6 5 - 14 mmol/L    BUN 12 9 - 23 mg/dL    Creatinine 3.66 (L) 0.55 - 1.02 mg/dL    BUN/Creatinine Ratio 26     eGFR CKD-EPI (2021) Female >90 >=60 mL/min/1.43m2    Glucose 101 70 - 179 mg/dL    Calcium 8.7 8.7 - 44.0 mg/dL   Magnesium Level    Collection Time: 10/21/22  6:20 AM   Result Value Ref Range    Magnesium 1.8 1.6 - 2.6 mg/dL   Phosphorus Level    Collection Time: 10/21/22  6:20 AM   Result Value Ref Range    Phosphorus 4.7 2.4 - 5.1 mg/dL   CBC    Collection Time: 10/21/22  6:20 AM   Result Value Ref Range    WBC 8.9 3.6 - 11.2 10*9/L    RBC 3.65 (L) 3.95 - 5.13 10*12/L    HGB 9.9 (L) 11.3 - 14.9 g/dL    HCT 34.7 (L) 42.5 - 44.0 %    MCV 80.0 77.6 - 95.7 fL    MCH 27.1 25.9 - 32.4 pg    MCHC 33.9 32.0 - 36.0 g/dL    RDW 95.6 38.7 - 56.4 %    MPV 10.6 6.8 - 10.7 fL    Platelet 194 150 - 450 10*9/L   ECG 12 Lead    Collection Time: 10/21/22  2:20 PM   Result Value Ref Range    EKG Systolic BP  mmHg    EKG Diastolic BP  mmHg    EKG Ventricular Rate 128 BPM    EKG Atrial Rate 128 BPM    EKG P-R Interval 126 ms    EKG QRS Duration 78 ms    EKG Q-T Interval 306 ms    EKG QTC Calculation 446 ms    EKG Calculated P Axis 59 degrees    EKG Calculated R Axis 16 degrees    EKG Calculated T Axis 24 degrees    QTC Fredericia 394 ms       Imaging:  XR Abdomen Portable    Result Date: 10/20/2022  EXAM: XR ABDOMEN PORTABLE ACCESSION: [id number] UN     CLINICAL INDICATION: 22 years old with ABDOMINAL PAIN  -  UNSPECIFIED SITE      COMPARISON: 10/06/2022 abdominal radiograph     TECHNIQUE: Supine view of the abdomen, 2 image(s)     FINDINGS: The lung bases appear unremarkable. An esophagogastric tube with tip and sidehole overlying the stomach is  identified. The tip overlies the distal stomach. Unchanged mild to moderately dilated loops of gas containing small bowel identified overlying the lower abdomen and upper pelvis similar to priors. Sequela of J-pouch creation in the pelvis. Otherwise the remaining small bowel loops are nondilated. Prominently distended bladder is identified. No evidence of acute osseous abnormalities.         An esophagogastric tube with tip and sidehole overlying the stomach is identified. The tip overlies the distal stomach. Unchanged mild to moderately dilated loops of gas containing small bowel are identified overlying the lower abdomen and upper pelvis similar to priors. This finding is not specific and could also be associated with postsurgical changes and/or paralytic ileus. Correlation with clinical findings recommended. Sequela of J-pouch creation in the pelvis. Otherwise the remaining small bowel loops are nondilated. Prominently distended bladder is identified.

## 2022-10-21 NOTE — Unmapped (Signed)
[facility]  Follow-Up Psychiatry Consult Note      Date of admission: 09/25/2022 10:30 AM  Service Date: October 21, 2022  Primary Team: [facility])  LOS:  LOS: 24 days      Assessment:   [name] is a 22 y.o. 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 and reported past psych history of GAD and MDD, admitted 09/25/2022 10:30 AM for other acute postprocedural pain following injection of desmoid tumor with steroid and anesthetic under ultrasound guidance.  Patient was seen in consultation by request of hospitalist on [facility] for evaluation of anxiety and depression.      [name] presents with symptoms consistent with a diagnosis of GAD and MDD, consistent with her historical diagnoses. Patient's symptoms appear to have worsened in the context of discontinuation of mirtazapine and ongoing medical stressors, including loss of independence and limited movement in achieving her longer term goals (e.g., completing nursing school). They are consistent with past reports of above diagnoses and include depressed mood, sleep disturbance, fatigue, anhedonia, hopelessness and passive thoughts of death, and increased anxiety with panic/anxiety attacks.    Today, patient presents with considerable fatigue and ongoing anxiety regarding current medical situation. Due to ongoing medical situation, patient has not had the opportunity to pursue homework regarding mindfulness or follow up with DBT. Discussed medication options moving forward but agreed that today would not be an appropriate day to change psychotropics. No medication changes indicated from a psychiatric perspective at this time, although it may be appropriate to start thinking about combination antidepressant management or further optimization of mirtazapine in the future.     Diagnoses:   Active Hospital problems:  Principal Problem:    Other acute postprocedural pain  Active Problems:    [name]    Intestinal polyps    [name]    Neoplasm related pain    Physical deconditioning    History of colectomy    Intractable abdominal pain    Generalized anxiety disorder with panic attacks    SBO (small bowel obstruction) (CMS-HCC)    Small intestinal bacterial overgrowth (SIBO)       Problems edited/added by me:  No problems updated.    Risk Assessment:  ASQ screening result: not completed    -A full risk assessment was previously performed on 7/31.  Risk assessment remains essentially unchanged.    Current suicide risk: low risk  Current homicide risk: low risk      Recommendations:     Safety and Observation Level:   -- This patient is not currently under IVC. If safety concerns arise, please page psychiatry for an evaluation.    Medications:  -- continue mirtazapine 15mg  q2200 for mood and anxiety  - continue lorazepam 0.5mg  TID PRN severe anxiety    Further Work-up:   -- No further recommendations at this time from a psychiatric standpoint    Behavioral / Environmental:   -- Utilize compassion and acknowledge the patient's experiences while setting clear and realistic expectations for care.     Follow-up:  -- When patient is discharged, please ensure that their AVS includes information about the 11 Suicide & Crisis Lifeline.  -- The patient currently receives mental health care with [facility] and we will attempt to coordinate followup with our psychiatry team on discharge. She is also recently established with a virtual DBT group at [facility] (Gonzella Lex LCSW)   --  We will follow as needed at this time.     Thank you for this consult request. Recommendations have been communicated to the primary team. Please page [phone]  for any questions or concerns.     [name], MD    I was available. I have discussed the case with the resident and read the resident's note. I agree with the assessment and plan as outlined above.  Althea Backs L. Corena Herter, MD Subjective     Relevant events since last seen by psychiatry: per chart, patient with significant abdominal pain requiring CT today    Patient Interview:  On encounter, patient was in bed with the shades drawn and lights off. Mom was at bedside. Patient reports significant pain and cramping overnight. We discussed upcoming medical care including CT. Patient reports that today would not be the best day to change medications, and has not had the opportunity to reach out to DBT due to ongoing medical concerns. Patient discussed that she would like to be actively involved in changes to her treatment plan if possible.     ROS:   All systems reviewed as negative/unremarkable aside from the following pertinent positives and negatives: pain    Collateral:   - Reviewed medical records in Epic    Relevant Updates to past psychiatric, medical/surgical, family, or social history: n/a    Current Medications:  Scheduled Meds:   baclofen  15 mg Oral TID    [Provider Hold] bisacodyl  10 mg Rectal BID    buprenorphine-naloxone  0.25 Film Sublingual TID    [START ON 10/22/2022] cetirizine  10 mg Enteral tube: gastric Daily    famotidine (PEPCID) IV  20 mg Intravenous At bedtime    mirtazapine  15 mg Enteral tube: gastric Nightly    [Provider Hold] naloxegol  25 mg Oral Daily    [Provider Hold] nirogacestat  50 mg Oral BID    pantoprazole (Protonix) intravenous solution  40 mg Intravenous BID    [Provider Hold] polyethylene glycol  17 g Oral BID    [Provider Hold] senna  2 tablet Oral Nightly     Continuous Infusions:   Chemo Clarification Order      IP okay to treat      lactated Ringers 100 mL/hr (10/21/22 0856)    sodium chloride       PRN Meds:.acetaminophen, aluminum-magnesium hydroxide-simethicone, calcium carbonate, Chemo Clarification Order, diphenhydrAMINE, fat emulsion 20 %, fat emulsion 20 %, guaiFENesin, HYDROmorphone, iohexol, IP okay to treat, LORazepam, ondansetron, oxyCODONE, phenol, polyethylene glycol, promethazine, zinc oxide-cod liver oil    Objective:   Vital signs:   Temp:  [36.7 ??C (98.1 ??F)-38.7 ??C (101.7 ??F)] 37.6 ??C (99.7 ??F)  Heart Rate:  [97-125] 97  SpO2 Pulse:  [97-122] 97  Resp:  [13-20] 13  BP: (108-121)/(60-68) 118/62  MAP (mmHg):  [74-81] 79  SpO2:  [99 %-100 %] 99 %    Physical Exam:  Gen: No acute distress.    Mental Status Exam:  Appearance:  appears stated age and in bed   Attitude:   calm, cooperative   Behavior/Psychomotor:  appropriate eye contact and no abnormal movements   Speech/Language:   normal rate, not pressured, normal volume, normal fluency. normal articulation   Mood:  ???had the worst night???   Affect:  mood congruent and dysthymic   Thought process:  logical, linear, clear, coherent, goal directed   Thought content:    denies thoughts of self-harm. Denies SI, plans, or intent. Denies HI.  No grandiose, self-referential, persecutory, or paranoid delusions noted.   Perceptual disturbances:   denies auditory and visual hallucinations   Attention:  able to attend to interview without fluctuations in consciousness   Concentration:  Able to fully concentrate and attend   Orientation:  grossly oriented.   Memory:  not formally tested, but grossly intact   Fund of knowledge:   not formally assessed   Insight:    Intact   Judgment:   Intact   Impulse Control:  Intact     Relevant laboratory/imaging data was reviewed.    Additional Psychometric Testing:  Not applicable.    Consult Type and Time-Based Documentation:  This patient was evaluated in person.    Time-based billing disclaimer:  I personally spent 45   minutes face-to-face and non-face-to-face in the care of this patient, which includes all pre, intra, and post visit time on the date of service.  All documented time was specific to the E/M visit and does not include any procedures that may have been performed.

## 2022-10-21 NOTE — Unmapped (Addendum)
Hospital Medicine Daily Progress Note    Assessment/Plan:    Principal Problem:    Other acute postprocedural pain  Active Problems:    [name]    Intestinal polyps    [name]    Neoplasm related pain    Physical deconditioning    History of colectomy    Intractable abdominal pain    Generalized anxiety disorder with panic attacks    SBO (small bowel obstruction) (CMS-HCC)    Small intestinal bacterial overgrowth (SIBO)  Resolved Problems:    * No resolved hospital problems. 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    Evolving RLQ abdominal pain  New fevers, tachycardia  Chronic abdominal pain, nausea 2/2 desmoid tumors and prior surgeries  Inability to tolerate PO, without diagnosis of malnutrition  Greatest concern for partial or complete obstruction. No BM since 8/11, reports she may not be passing flatus. Pain is worsening. Fevers overnight despite tylenol. No other focal complaints/symptoms. Also considered intraabdominal infection, bacteremia in setting of TPN. No e/o pneumonia or primary respiratory process. If imaging is unrevealing will evaluate for alternative causes (e.g., respiratory viral panel).   PLAN  - AM labs pending  - Blood cultures sent, TPN put on hold  - Surgery consulted and recommending CT a/p with IV and oral contrast (ordered STAT)  - Bowel regimen put on hold in case she is obstructed  - mIVFs ordered, pain control    Muscle rigidity, increased tone and spasms   Occurred post-procedurally, with reassuring evaluation (MRI spine, neuro c/s, CK normal) at that time. Thought it was due to anesthesia (has had similar, though much more transient, episodes after anesthesia in the past). Increased tone remains quite severe and it has not improved - initial plan was to increase baclofen and monitor for improvement. Neurology re-engaged 8/11 and recommending further evaluation for a central process (MRI), nutritional deficiencies/excess levels. May also benefit from EMG but their team will discuss further today. Given her tumor syndrome, I do think it is important to make sure we are not missing something. This does not feel normal.  PLAN  - Appreciate chronic pain and PM&R  - Neurology re-engaged, anticipating further recommendations today  - Ordered MRI w/wo and nutritional studies - pending  - Baclofen 15 mg TID; monitoring for side effects  - Continue ativan TID prn (PTA med), but caution against escalating    Elevated LFTs  Hepatocellular injury  AST/ALT 400s this morning. Mildly elevated a few days ago but were down trending to normal before today. Unclear etiology - unclear if this could be related to TPN. Will discuss with pharmacy. Already down-trending again this afternoon and no bili elevation. Mother also asked if it could be a side effect of her new chemotherapy, vs other med (DILI). Differential also includes AI process, congestion, clot.  PLAN  - Discuss with pharmacy re: possible drug induced liver injury  - Continue to monitor daily for now  - If not continuing to fall today will talk to hepatology    Syncope and pre-syncope,  uncertain etiology  Possibly vasovagal vs adverse effect of medications vs deconditioning vs iron deficiency. Has improved with weaning opioids but still occurring, last incident 8/6 (LOC per nurse, witnessed). Previously worked up for PE, with negative CTA. TTE normal. Cosyntropin stim test and thyroid studies normal. Remains in sinus without events (pauses) on telemetry.  PLAN  - Limit polypharmacy  - S/p IV iron    Iron deficiency anemia  Ferritin 5, iron saturation 6%. Has had prior reactions with numerous attempts to give IV iron.   - S/p Feraheme 1020 mg. Received pre-med protocol per A&I  - Would not give IV iron except when it cannot be avoided given severe itching/stress it causes her  - Continues to have itching but have switched to oral benadryl, please avoid IV    Headaches  Minimal improvement with IV fluids, magnesium, IV Benadryl. Concerning for rebound/medication overuse headache. Headache has improved with Tylenol holiday and ketamine though not completely resolved. Though her chemotherapy also has headaches as a common side effect.  PLAN  - Enteral PRN tylenol, fioricet daily prn could be considered if remains severe  - Avoid NSAIDs, avoid IV Benadryl    Desmoid tumors - [name]  - Restarting nirogacestat at lower dose of 50 mg BID on 8/8 after discussion with primary oncologist Dr. [name]  - ON HOLD until obstruction is ruled out     Chronic/stable/resolved problems:    SIBO: Hold home cefdinir, doxy    FEN: Encourage regular diet, TPN  VTE ppx: SCDs ordered, mobilize as able  Code status: Full code  Dispo: Home with HH PT/OT/TPN vs SNF pending ability to stand/walk and removal of NG tube    I personally spent 60 minutes face-to-face and non-face-to-face in the care of this patient, which includes all pre, intra, and post visit time on the date of service. All documented time was specific to the E/M visit and does not include any procedures that may have been performed.    Chauncy Lean, MD  Hospital Medicine   ___________________________________________________________________    Subjective:  Horrible night. Worsening pain, tachycardia, fevers. Reports night provider didn't see her for 3 hours and then only gave 1 dose of pain medication. She's in severe pain and feels like something is very wrong. Surgery evaluated her this morning - recs in. She's anxious/upset.    Labs/Studies:  Labs per EMR and Reviewed (last 24hrs)    Objective:  BP 108/68  - Pulse 103  - Temp 37.6 ??C (99.7 ??F) (Oral)  - Resp 16  - Ht 165.1 cm (5' 5)  - Wt 60.2 kg (132 lb 11.5 oz)  - SpO2 100%  - BMI 22.09 kg/m??     GEN: In distress, fatigued, appears unwell but not toxic  HEENT: NG tube in place, pale conjunctiva, MMM, normal hearing  CV: Tachycardia improving but still present  EXT: Grossly symmetric, very mild swelling in bilateral feet but not legs  ABD: Soft, mildly distended, very tender in RLQ but not peritonitic   SKIN: No rashes or erythema  NEURO: Muscle rigidity/increased tone in lower extremities, generalized ~4/5 weakness  PSYCH: Anxious, stressed, cooperative

## 2022-10-22 LAB — CBC
HEMATOCRIT: 27.4 % — ABNORMAL LOW (ref 34.0–44.0)
HEMOGLOBIN: 9.1 g/dL — ABNORMAL LOW (ref 11.3–14.9)
MEAN CORPUSCULAR HEMOGLOBIN CONC: 33.1 g/dL (ref 32.0–36.0)
MEAN CORPUSCULAR HEMOGLOBIN: 26.7 pg (ref 25.9–32.4)
MEAN CORPUSCULAR VOLUME: 80.5 fL (ref 77.6–95.7)
MEAN PLATELET VOLUME: 11.2 fL — ABNORMAL HIGH (ref 6.8–10.7)
PLATELET COUNT: 154 10*9/L (ref 150–450)
RED BLOOD CELL COUNT: 3.4 10*12/L — ABNORMAL LOW (ref 3.95–5.13)
RED CELL DISTRIBUTION WIDTH: 14.2 % (ref 12.2–15.2)
WBC ADJUSTED: 7.6 10*9/L (ref 3.6–11.2)

## 2022-10-22 LAB — BASIC METABOLIC PANEL
ANION GAP: 8 mmol/L (ref 5–14)
BLOOD UREA NITROGEN: 8 mg/dL — ABNORMAL LOW (ref 9–23)
BUN / CREAT RATIO: 14
CALCIUM: 8.5 mg/dL — ABNORMAL LOW (ref 8.7–10.4)
CHLORIDE: 102 mmol/L (ref 98–107)
CO2: 23 mmol/L (ref 20.0–31.0)
CREATININE: 0.58 mg/dL
EGFR CKD-EPI (2021) FEMALE: >90 mL/min/{1.73_m2} (ref >=60)
GLUCOSE RANDOM: 103 mg/dL (ref 70–179)
POTASSIUM: 3.9 mmol/L (ref 3.4–4.8)
SODIUM: 133 mmol/L — ABNORMAL LOW (ref 135–145)

## 2022-10-22 LAB — URINALYSIS WITH MICROSCOPY
BILIRUBIN UA: NEGATIVE
GLUCOSE UA: NEGATIVE
KETONES UA: 10 — AB
NITRITE UA: NEGATIVE
PH UA: 7 (ref 5.0–9.0)
PROTEIN UA: 30 — AB
RBC UA: 15 /HPF — ABNORMAL HIGH (ref <=4)
SPECIFIC GRAVITY UA: 1.027 (ref 1.003–1.030)
SQUAMOUS EPITHELIAL: 3 /HPF (ref 0–5)
UROBILINOGEN UA: <2
WBC UA: 8 /HPF — ABNORMAL HIGH (ref 0–5)

## 2022-10-22 LAB — HEPATIC FUNCTION PANEL
ALBUMIN: 2.9 g/dL — ABNORMAL LOW (ref 3.4–5.0)
ALKALINE PHOSPHATASE: 110 U/L (ref 46–116)
ALT (SGPT): 176 U/L — ABNORMAL HIGH (ref 10–49)
AST (SGOT): 51 U/L — ABNORMAL HIGH (ref <=34)
BILIRUBIN DIRECT: 0.2 mg/dL (ref 0.00–0.30)
BILIRUBIN TOTAL: 0.4 mg/dL (ref 0.3–1.2)
PROTEIN TOTAL: 5.5 g/dL — ABNORMAL LOW (ref 5.7–8.2)

## 2022-10-22 LAB — PHOSPHORUS: PHOSPHORUS: 4 mg/dL (ref 2.4–5.1)

## 2022-10-22 LAB — NIACIN (VITAMIN B3)
NICOTINAMIDE: 11.7 ng/mL
NICOTINIC ACID (NIACIN): <5 ng/mL
NICOTINURIC ACID: <5 ng/mL

## 2022-10-22 LAB — VANCOMYCIN, RANDOM: VANCOMYCIN RANDOM: 9.7 ug/mL

## 2022-10-22 LAB — VITAMIN D 25 HYDROXY: VITAMIN D, TOTAL (25OH): 21 ng/mL (ref 20.0–80.0)

## 2022-10-22 LAB — TRYPTASE: TRYPTASE LEVEL: 2.4 ng/mL

## 2022-10-22 LAB — MAGNESIUM: MAGNESIUM: 1.6 mg/dL (ref 1.6–2.6)

## 2022-10-22 MED ADMIN — LORazepam (ATIVAN) injection 1 mg: 1 mg | INTRAVENOUS | @ 23:00:00

## 2022-10-22 MED ADMIN — oxyCODONE (ROXICODONE) immediate release tablet 10 mg: 10 mg | ORAL | @ 15:00:00 | Stop: 2022-10-25

## 2022-10-22 MED ADMIN — vancomycin (VANCOCIN) 1250 mg in sodium chloride (NS) 0.9% 250 mL IVPB: 1250 mg | INTRAVENOUS | @ 02:00:00 | Stop: 2022-10-21

## 2022-10-22 MED ADMIN — midazolam (VERSED) injection 2 mg: 2 mg | INTRAVENOUS | @ 18:00:00 | Stop: 2022-10-22

## 2022-10-22 MED ADMIN — buprenorphine-naloxone (SUBOXONE) 2-0.5 mg SL film 0.5 mg of buprenorphine: .25 | SUBLINGUAL | @ 14:00:00

## 2022-10-22 MED ADMIN — buprenorphine-naloxone (SUBOXONE) 2-0.5 mg SL film 0.5 mg of buprenorphine: .25 | SUBLINGUAL | @ 20:00:00

## 2022-10-22 MED ADMIN — lactated Ringers infusion: 100 mL/h | INTRAVENOUS | @ 16:00:00

## 2022-10-22 MED ADMIN — metroNIDAZOLE (FLAGYL) IVPB 500 mg: 500 mg | INTRAVENOUS | @ 02:00:00 | Stop: 2022-10-28

## 2022-10-22 MED ADMIN — baclofen (LIORESAL) tablet 15 mg: 15 mg | ORAL | @ 14:00:00

## 2022-10-22 MED ADMIN — vancomycin (VANCOCIN) IVPB 1000 mg (premix): 1000 mg | INTRAVENOUS | @ 08:00:00 | Stop: 2022-10-29

## 2022-10-22 MED ADMIN — lidocaine (XYLOCAINE) 10 mg/mL (1 %) injection 8 mL: 8 mL | SUBCUTANEOUS | @ 16:00:00 | Stop: 2022-10-22

## 2022-10-22 MED ADMIN — HYDROmorphone (PF) (DILAUDID) injection 1 mg: 1 mg | INTRAVENOUS | @ 12:00:00 | Stop: 2022-11-04

## 2022-10-22 MED ADMIN — vancomycin (VANCOCIN) IVPB 1000 mg (premix): 1000 mg | INTRAVENOUS | @ 16:00:00 | Stop: 2022-10-29

## 2022-10-22 MED ADMIN — pantoprazole (Protonix) injection 40 mg: 40 mg | INTRAVENOUS | @ 01:00:00

## 2022-10-22 MED ADMIN — promethazine (PHENERGAN) 6.5 mg in sodium chloride (NS) 0.9 % 50 mL IVPB: 6.5 mg | INTRAVENOUS | @ 20:00:00

## 2022-10-22 MED ADMIN — cetirizine (ZYRTEC) tablet 10 mg: 10 mg | GASTROENTERAL | @ 14:00:00

## 2022-10-22 MED ADMIN — pantoprazole (Protonix) injection 40 mg: 40 mg | INTRAVENOUS | @ 14:00:00

## 2022-10-22 MED ADMIN — oxyCODONE (ROXICODONE) immediate release tablet 10 mg: 10 mg | ORAL | @ 22:00:00 | Stop: 2022-10-25

## 2022-10-22 MED ADMIN — cefepime (MAXIPIME) 2 g in sodium chloride 0.9 % (NS) 100 mL IVPB-MBP: 2 g | INTRAVENOUS | @ 15:00:00 | Stop: 2022-10-28

## 2022-10-22 MED ADMIN — lactated Ringers infusion: 100 mL/h | INTRAVENOUS | @ 06:00:00

## 2022-10-22 MED ADMIN — ondansetron (ZOFRAN) injection 4 mg: 4 mg | INTRAVENOUS | @ 23:00:00

## 2022-10-22 MED ADMIN — metroNIDAZOLE (FLAGYL) IVPB 500 mg: 500 mg | INTRAVENOUS | @ 08:00:00 | Stop: 2022-10-28

## 2022-10-22 MED ADMIN — lactated ringers bolus 1,000 mL: 1000 mL | INTRAVENOUS | @ 14:00:00 | Stop: 2022-10-22

## 2022-10-22 MED ADMIN — mirtazapine (REMERON) tablet 15 mg: 15 mg | GASTROENTERAL | @ 02:00:00

## 2022-10-22 MED ADMIN — cefepime (MAXIPIME) 2 g in sodium chloride 0.9 % (NS) 100 mL IVPB-MBP: 2 g | INTRAVENOUS | @ 08:00:00 | Stop: 2022-10-28

## 2022-10-22 MED ADMIN — bisacodyl (DULCOLAX) suppository 10 mg: 10 mg | RECTAL | @ 01:00:00

## 2022-10-22 MED ADMIN — oxyCODONE (ROXICODONE) immediate release tablet 10 mg: 10 mg | ORAL | @ 04:00:00 | Stop: 2022-10-25

## 2022-10-22 MED ADMIN — naloxegol (MOVANTIK) tablet 25 mg: 25 mg | ORAL | @ 14:00:00

## 2022-10-22 MED ADMIN — bisacodyl (DULCOLAX) suppository 10 mg: 10 mg | RECTAL | @ 14:00:00

## 2022-10-22 MED ADMIN — lactated ringers bolus 1,000 mL: 1000 mL | INTRAVENOUS | @ 01:00:00

## 2022-10-22 MED ADMIN — ondansetron (ZOFRAN) injection 4 mg: 4 mg | INTRAVENOUS | @ 12:00:00

## 2022-10-22 MED ADMIN — polyethylene glycol (MIRALAX) packet 17 g: 17 g | ORAL | @ 14:00:00

## 2022-10-22 MED ADMIN — acetaminophen (TYLENOL) tablet 650 mg: 650 mg | GASTROENTERAL | @ 10:00:00 | Stop: 2022-10-22

## 2022-10-22 MED ADMIN — HYDROmorphone (PF) (DILAUDID) injection 1 mg: 1 mg | INTRAVENOUS | @ 16:00:00 | Stop: 2022-11-04

## 2022-10-22 MED ADMIN — HYDROmorphone (PF) (DILAUDID) injection 1 mg: 1 mg | INTRAVENOUS | @ 06:00:00 | Stop: 2022-11-04

## 2022-10-22 MED ADMIN — buprenorphine-naloxone (SUBOXONE) 2-0.5 mg SL film 0.5 mg of buprenorphine: .25 | SUBLINGUAL | @ 01:00:00

## 2022-10-22 MED ADMIN — HYDROmorphone (PF) (DILAUDID) injection 1 mg: 1 mg | INTRAVENOUS | @ 02:00:00 | Stop: 2022-11-04

## 2022-10-22 MED ADMIN — baclofen (LIORESAL) tablet 15 mg: 15 mg | ORAL | @ 20:00:00

## 2022-10-22 MED ADMIN — baclofen (LIORESAL) tablet 15 mg: 15 mg | ORAL | @ 01:00:00

## 2022-10-22 MED ADMIN — metroNIDAZOLE (FLAGYL) IVPB 500 mg: 500 mg | INTRAVENOUS | @ 18:00:00 | Stop: 2022-10-28

## 2022-10-22 MED ADMIN — acetaminophen (TYLENOL) tablet 650 mg: 650 mg | GASTROENTERAL | @ 04:00:00 | Stop: 2022-10-22

## 2022-10-22 MED ADMIN — diphenhydrAMINE (BENADRYL) capsule 50 mg: 50 mg | ORAL | @ 11:00:00 | Stop: 2022-10-22

## 2022-10-22 MED ADMIN — LORazepam (ATIVAN) injection 1 mg: 1 mg | INTRAVENOUS

## 2022-10-22 MED ADMIN — promethazine (PHENERGAN) 6.5 mg in sodium chloride (NS) 0.9 % 50 mL IVPB: 6.5 mg | INTRAVENOUS | @ 02:00:00

## 2022-10-22 MED ADMIN — cefepime (MAXIPIME) 2 g in sodium chloride 0.9 % (NS) 100 mL IVPB-MBP: 2 g | INTRAVENOUS | @ 01:00:00 | Stop: 2022-10-28

## 2022-10-22 MED ADMIN — HYDROmorphone (PF) (DILAUDID) injection 1 mg: 1 mg | INTRAVENOUS | @ 20:00:00 | Stop: 2022-11-04

## 2022-10-22 MED ADMIN — acetaminophen (TYLENOL) tablet 650 mg: 650 mg | GASTROENTERAL | @ 20:00:00 | Stop: 2022-10-22

## 2022-10-22 MED ADMIN — famotidine (PF) (PEPCID) injection 20 mg: 20 mg | INTRAVENOUS | @ 01:00:00

## 2022-10-22 MED ADMIN — magnesium sulfate 2gm/50mL IVPB: 2 g | INTRAVENOUS | @ 22:00:00 | Stop: 2022-10-22

## 2022-10-22 NOTE — Unmapped (Signed)
Hospital Medicine Daily Progress Note    Assessment/Plan:    Principal Problem:    Other acute postprocedural pain  Active Problems:    [name]    Intestinal polyps    [name]    Neoplasm related pain    Physical deconditioning    History of colectomy    Intractable abdominal pain    Generalized anxiety disorder with panic attacks    SBO (small bowel obstruction) (CMS-HCC)    Small intestinal bacterial overgrowth (SIBO)   Malnutrition Evaluation as performed by RD, LDN: Patient does not meet AND/ASPEN criteria for malnutrition at this time (09/29/22 1439)             [name] [name] is a 22 year old woman w/ [name] s/p proctocolectomy w/ileoanal anastomosis, cutaneous desmoid tumors (previously on sorafenib), anemia, [name], anxiety/nausea admitted with ileus, [name], and diffuse pain due to muscle spasms of uncertain etiology.    GNR bacteremia  High fevers  Unclear source: high risk for line infection given she was on TPN, GN also raise concern for intraabdominal source given clinical changes over the past 48 hours. She is still being covered broadly for now, fever has not broke.   PLAN  - Consulted ID for assistance, recommendations pending  - Central line exchanged today (VIR removed her old one, Med M placed a new one several hours later)  - Continue vanc, cefepime, flagyl pending more culture data (8/13 sets)  - Remains appropriate for step down  - Bolus fluids as needed, increased loses with her ongoing fevers  - Tylenol prn fever    Evolving RLQ abdominal pain  Chronic abdominal pain, nausea 2/2 desmoid tumors and prior surgeries  Ileus - recurrent but less severe  Greatest concern for ileus vs partial obstruction. She is moving her bowels now x2 and abdomen is softer after putting her NG to LIS overnight.   PLAN  - Resumed bowel regimen per GI recommendation  - mIVFs ordered, pain control  - Encouraging her to limit opiates as much as possible  - NG can be clamped again  - BMP again tonight to check lytes, getting mag today    Muscle rigidity, increased tone and spasms   Occurred post-procedurally, with reassuring evaluation (MRI spine, neuro c/s, CK normal) at that time. Thought it was due to anesthesia (has had similar, though much more transient, episodes after anesthesia in the past). Increased tone remains quite severe and it has not improved - initial plan was to increase baclofen and monitor for improvement. Neurology re-engaged 8/11 and recommending further evaluation for a central process (MRI), nutritional deficiencies/excess levels. May also benefit from EMG but their team will discuss further today. Given her tumor syndrome, I do think it is important to make sure we are not missing something. This does not feel normal.  PLAN  - Appreciate chronic pain and PM&R  - Neurology re-engaged, anticipating further recommendations today  - Ordered MRI w/wo - pending, delayed due to IV iron  - Follow up nutritional studies  - Baclofen 15 mg TID; monitoring for side effects  - Continue ativan TID prn (PTA med), but caution against escalating    Elevated LFTs - resolving  AST/ALT 400s and now rapidly falling again. Mildly elevated a few days ago as well. Unclear etiology - unclear if this could be related to TPN/over feeding. Discussed with pharmacy/TPN - not much to do if that's the cause,  they are monitoring as well. Mother also asked if it could be a side effect of her new chemotherapy, which pharmacy reports is possible. Differential also includes congestion, clot.  PLAN  - Plan to discuss with oncology re: if her chemotherapy could have caused the transaminitis, currently on hold anyway due to acute illness/ileus  - Continue to monitor daily for now    Syncope and pre-syncope, uncertain etiology  Possibly vasovagal vs adverse effect of medications vs deconditioning vs iron deficiency. Has improved with weaning opioids but still occurring, last incident 8/14 (LOC per nurse, witnessed) while on the commode. Previously worked up for PE, with negative CTA. TTE normal. Cosyntropin stim test and thyroid studies normal. Remains in sinus without events (pauses) on telemetry.  PLAN  - Limit polypharmacy  - S/p IV iron  - Bolus LR as needed    Iron deficiency anemia  Ferritin 5, iron saturation 6%. Has had prior reactions with numerous attempts to give IV iron.   - S/p Feraheme 1020 mg. Received pre-med protocol per A&I  - Would not give IV iron except when it cannot be avoided given severe itching/stress it causes her    Headaches  Minimal improvement with IV fluids, magnesium, IV Benadryl. Concerning for rebound/medication overuse headache. Headache has improved with Tylenol holiday and ketamine though not completely resolved. Though her chemotherapy also has headaches as a common side effect.  PLAN  - Enteral PRN tylenol, fioricet daily prn could be considered if remains severe  - Avoid NSAIDs, avoid IV Benadryl    Desmoid tumors - [name]  - Restarting nirogacestat at lower dose of 50 mg BID on 8/8 after discussion with primary oncologist Dr. [name]  - ON HOLD    Chronic/stable/resolved problems:    SIBO: Hold home cefdinir, doxy    FEN: Encourage regular diet, TPN  VTE ppx: Lovenox while acutely infected to reduce risk of clots  Code status: Full code  Dispo: Home with HH PT/OT/TPN vs SNF pending ability to stand/walk and removal of NG tube    I personally spent 75 minutes face-to-face and non-face-to-face in the care of this patient, which includes all pre, intra, and post visit time on the date of service. This included talking to consultants, coordinating care, and talking to patient/family/RN about plan of care. All documented time was specific to the E/M visit and does not include any procedures that may have been performed.    Chauncy Lean, MD  Hospital Medicine ___________________________________________________________________    Subjective:  High fevers. Ongoing symptoms of headache, belly pain. Had 2 bowel movements overnight. Did faint on commode after feeling dizzy. Resolved quickly. Feels awful. Nervous about line exchanges. Many questions, all answered.     Labs/Studies:  Labs per EMR and Reviewed (last 24hrs)    Objective:  BP 108/45  - Pulse 119  - Temp (!) 39.2 ??C (102.5 ??F) (Oral)  - Resp 8  - Ht 165.1 cm (5' 5)  - Wt 60.2 kg (132 lb 11.5 oz)  - SpO2 99%  - BMI 22.09 kg/m??     GEN: NAD, fatigued, appears unwell   HEENT: NG tube in place (800cc bilious output overnight in canister), MMM, normal hearing  CV: Tachycardia, no murmurs  EXT: Grossly symmetric, very mild swelling in bilateral feet but not legs  ABD: Soft, mildly distended, tender in RLQ but not peritonitic, +bs (many)  SKIN: No rashes or erythema  NEURO: Muscle rigidity/increased tone in lower extremities, generalized ~4/5 weakness, boots on  PSYCH: Stressed, cooperative, appropriate

## 2022-10-22 NOTE — Unmapped (Signed)
Medicine Procedure Service - New Procedure Request Triage    Procedure Requested: CVC versus temporary peripheral line placement  Indication: need for IV access  Urgency: ASAP    Questions for all procedures    1. Is the patient able to give consent for this procedure? - Yes  2. If no, has family to give consent been identified? - NA  3. Have you explained to the patient/family why you are recommending this procedure? - Yes  4. Safe to give peri-procedural anxiolysis? (i.e, lorazepam 1 mg IV; if so, team or M can order) - Yes  5. Has team placed med procedure consult order? (If not, please remind team to do so) - Yes  6. Is patient currently on anticoagulation? No     INR   Date Value Ref Range Status   07/21/2022 1.06  Final   10/31/2011 1.1  Final     PT   Date Value Ref Range Status   07/21/2022 11.8 9.9 - 12.6 sec Final   10/31/2011 12.1 9.5 - 12.7 SECONDS Final     APTT   Date Value Ref Range Status   05/02/2022 57.7 (H) 24.8 - 38.4 sec Final     Platelet   Date Value Ref Range Status   10/22/2022 154 150 - 450 10*9/L Final   06/13/2014 221 150 - 440 10*9/L Final       Central Line    Does patient require central access? (e.g., meds needing central route, HD catheter, PLEX catheter) - No  Is patient PICC candidate? (if so, the team should consider this option) - No  Is patient able to cooperate with positioning? Can they lie flat for 30-45 min? (if not, may not be candidate for bedside CVAD) - Yes  Any severe bleeding concerns? (this is low risk procedure for bleeding, see below) - No      Medicine Procedure Service  Guidelines for Peri-procedural Management of Bleeding Risk (revised 04/02/2022)   Low Bleeding Risk  Paracentesis  Thoracentesis  I&D  CVC  Arthrocentesis   Moderate-High Bleeding Risk  Lumbar puncture*   INR <2-3, N/a for cirrhosis <2   Platelet >20 >30        ASA, low dose Not required to hold Do not hold   ASA, high dose Not required to hold Stop 5 days before   Clopidogrel Not required to hold Stop 5 days before   Ticagrelor Not required to hold Stop 5 days before   Prasugrel Not required to hold Stop 7 days before        UFH Not required to hold Stop 2-4 hours before   LMWH (prophylactic) Not required to hold Stop 12 hours before   LMWH (treatment) Not required to hold Stop 24 hours before        Warfarin INR < 3 Stop until INR < 1.8, consider 4 factor prothrombin complex concentrate with heme consult   Apixaban (Eliquis), BID Not required to hold Hold 4 doses (GFR > 50) or 6 doses (GFR <30-50). If urgent, see table below and consider heme consult.   Dabigatran (Pradaxa), BID Not require to hold Hold 5-6 doses (GFR > 50) or 7-8 doses (GFR < 30-50).  If urgent, see table below and consider heme consult.   Rivaroxaban (Xarelto), maintenance once daily Not required to hold Hold for 2 doses (GFR > 30) or 3 doses (GFR < 30).  If urgent, see table below and consider heme consult.     2019 SIR  guidelines now consider LP to be a low-risk procedure, was a moderate risk procedure in the 2012 guidelines. [facility] 2015 considers LP high risk, as does MD Dareen Piano 2017 guidelines; both available online  While thoracentesis is considered a low-risk procedure, discuss with IP if patient is on antiplatelets (other than ASA) or DOAC  Adapted from the 2019 Society of Interventional Radiology Consensus Guidelines: https://www.jvir.org/article/S1051-0443(19)30407-5/pdf  While thoracentesis and paracentesis may not require holding anticoagulation, if safe and convenient to hold, doing so may reduce risk for hemorrhage if a vessel is inadvertently punctured

## 2022-10-22 NOTE — Unmapped (Signed)
CVAD Liaison - Aborted or Unsuccessful Line Insertion Note    CVAD Liaison Nurse was available for CVC placement.  Line placement was unsuccessful by Dr. Londell Moh MD.  Report was given to the Primary Nurse.      Thank you for this consult,  Mauri Reading, RN BSN, CVAD Liaison    Consult Time  60 minutes (min)

## 2022-10-22 NOTE — Unmapped (Signed)
CVAD Liaison - Insertion Note      The CVAD Liaison was contacted for the insertion of Central Venous Access Device (CVAD).  A chart review performed.   Indication: TPN    Prior to the start of the procedure, a time out was performed and the identity of the patient was confirmed via name, medical record number and date of birth.  The sterile field was prepared with necessary supplies and equipment verified.  Insertion site was prepped with chlorhexidine and allowed to dry.  Maximum sterile techniques was utilized.    CVAD was inserted by [facility] MD.  Catheter was aspirated and flushed.  After line was placed and secured by provider, the insertion site cleansed and sterile dressing applied per manufacturer guidelines by CVAD Liaison.     The Central Line Checklist was referenced.  CVAD Liaison was present during entire procedure.  Report of the procedure given to the Primary Nurse.      Thank you for this consult,  Mauri Reading, RN BSN, CVAD Liaison     Consult Time 120 minutes

## 2022-10-22 NOTE — Unmapped (Signed)
VENOUS ACCESS ULTRASOUND PROCEDURE NOTE    Indications:   Poor venous access.    The Venous Access Team has assessed this patient for the placement of a PIV. Ultrasound guidance was necessary to obtain access.     Procedure Details:  Identity of the patient was confirmed via name, medical record number and date of birth. The availability of the correct equipment was verified.    The vein was identified for ultrasound catheter insertion.  Field was prepared with necessary supplies and equipment.  Probe cover and sterile gel utilized.  Insertion site was prepped with chlorhexidine solution and allowed to dry.  The catheter extension was primed with normal saline. A(n) 22 gauge 1 catheter was placed in the R Forearm with 1attempt(s). See LDA for additional details.    Catheter aspirated, 5 mL blood return present. The catheter was then flushed with 10 mL of normal saline. Insertion site cleansed, and dressing applied per manufacturer guidelines. The catheter was inserted with difficulty due to poor vasculature by Bo Merino, RN.     Care RN was notified.     Thank you,     Bo Merino, RN Venous Access Team   [phone]     Workup / Procedure Time:  30 minutes    See images below:

## 2022-10-22 NOTE — Unmapped (Signed)
Vancomycin Therapeutic Monitoring Pharmacy Note    [name] [name] is a 22 y.o. female starting vancomycin. Date of therapy initiation: 10/21/22    Indication: Bacteremia/Sepsis    Prior Dosing Information: None/new initiation     Goals:  Therapeutic Drug Levels  Vancomycin trough goal: 10-15 mg/L    Additional Clinical Monitoring/Outcomes  Renal function, volume status (intake and output)    Results: Not applicable    Wt Readings from Last 1 Encounters:   10/11/22 60.2 kg (132 lb 11.5 oz)     Creatinine   Date Value Ref Range Status   10/21/2022 0.47 (L) 0.55 - 1.02 mg/dL Final   54/27/0623 7.62 (L) 0.55 - 1.02 mg/dL Final   83/15/1761 6.07 (L) 0.55 - 1.02 mg/dL Final        Pharmacokinetic Considerations and Significant Drug Interactions:  Adult (estimated initial): Vd = 42.7 L, ke = 0.182 hr-1  Concurrent nephrotoxic meds: not applicable    Assessment/Plan:  Recommendation(s)  Start vancomycin 1250mg  loading dose, followed with 1000mg  q8h  Estimated trough on recommended regimen:  12-13 mg/L    Follow-up  Level due:  10/22/22 at 1900 (ordered)  A pharmacist will continue to monitor and order levels as appropriate    Please page service pharmacist with questions/clarifications.    Candise Che, PharmD,

## 2022-10-22 NOTE — Unmapped (Signed)
Patient is alert/ oriented X4, able to communicate her needs. Patient is NPO for bedside line placement by Med M. VIR came to the beside and removed her previous line without issue. VS WNL for this patient, see EPIC F/S for details. NGT to ILWS at times based on PO medication administration. IVF has been slowed to conserve the one remaining PIV. Family is at the bedside interjecting questions, and supportive to the patient. No acute events. Plan of care is ongoing.      Problem: Adult Inpatient Plan of Care  Goal: Plan of Care Review  Outcome: Progressing

## 2022-10-22 NOTE — Unmapped (Signed)
Division of Infectious Diseases  General Inpatient Consultation Service       For questions about this consult, page [phone] (Gen A Follow-up Pager).      [name] [name] is being seen in consultation at the request of [name] Cloud, MD for evaluation and management of GNR bacteremia.       PLAN FOR 10/22/2022    Diagnostic  FU ID and Sensitivities of GNR growing in blood cultures sent on 10/21/2022.  Follow-up Bcx from 10/21/2022.  Please send urine sample for culture in view of positive Ua and findings on CT A/P.  Recommend in view of abnormal LFTs and GNR bacteremia to obtain an ultrasound liver.  Monitor for antimicrobial toxicity with the following:  CBC w/diff at least once per week  BMP at least once per week  clinical assessments for rashes or other skin changes  vancomycin trough levels (goal 10-15; at least weekly once stable)    Treatment  Continue on IV Cefepime, Vancomycin and Metronidazole.  If continues to spike temp consider adding IV Micafungin, risk of candida infection due to TPN use via central line.  START on swish and spit oral nystatin for oral thrush, if at some point starts on IV Micafungin, can discontinue oral nystatin.  Duration of therapy = TBD  start date = 10/21/22  end date = TBD    I discussed the plans for today with primary team, family/caregiver(s), and patient on 10/22/2022.    Our service will continue to follow.    I personally spent 90 mins (1.5h) face-to-face and non-face-to-face in the care of this patient on 10/22/2022, which includes all pre, intra, and post visit time on the date of service.  All documented time was specific to the E/M visit and does not include any procedures that may have been performed.      [name] [name], MBBS  [facility]            ID ATTENDING PHYSICIAN ATTESTATION  I saw and evaluated [name] [name], participating in the key portions of the service. 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  Abd CT showed mildly dilated small bowel loops and dilated ileum but no SBO, known desmoid tumor, and bladder wall thickening. Surgery consult eval did not reveal any acute indication for surgical intervention. She was started on Vancomycin, Cefepime and Metronidazole on 8/13. Blood cx from 8/13 growing GNR (speciation pending).  UA showed some pyuria. Her central venous catheter was removed today. Would continue Vanco, Cefepime, and Metronidazole while awaiting final culture results.  We will follow-up in Micro Lab. Low threshold to add Micafungin if fevers recur or any other clinical decompensation.  I reviewed the ID fellow's note and agree with the fellow's findings and plan. [TP01]    I personally reviewed this patient's lab & micro results and imaging report(s) and I agree with the findings as reported.    [name] Michael, MD, MS  Division of Infectious Diseases  ------------------------------------------        MDM and Problem-Specific Assessments  ( .00ID2DAY  /  .09WJXBJYNWGN  /  .IDSS  / .[name] )     22 y.o. 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 S/p R IJ removal on 8/14.    Patient has: []  acute illness w/systemic sxs  [mod] [x]  illness posing risk to life or function  [high]   I reviewed:   (3+) [x]  primary team note [x]  consultant note(s) [x]  procedure/op note(s) [x]  micro result(s)    [x]  CBC results [x]  chemistry results [x]  radiology report(s) [x]  w/indep. historian   I independently visualized:   (any)   []  cxs/plates in lab []  plain film images []  CT images []  PET images    []  path slide(s) []  ECG tracing []  MRI images []  nuclear scan   I discussed: (any) [x]  micro and/or path w/lab personnel []  drug options and/or interactions w/ID pharmD    []  procedure/OR findings w/other MD(s) []  echo and/or imaging w/other MD(s)    []  mgm't w/attending(s) involved in case []  setting up home abx w/OPAT team   Mgm't requires: []  prescription drug(s)  [mod] [x]  intensive toxicity monitoring  [high]       # GNR bacteremia  -    ACUTE = LESS THAN 1 YEAR DURATION  Patient admitted since 7/18 with course c/b worsening RLQ abdominal pain in setting of new recurrent fevers and tachycardia. Of note, R IJ for TPN present since 01/2022. Blood cultures on 8/13 grew 1/2 GNR.Discussed with Microbiology likely a lactose fermenter.    Management is as per the blue box above.    # Circumferential bladder wall thickening on CT from 10/22/2022 -    ACUTE = LESS THAN 1 YEAR DURATION  Ua from 10/22/2022 positive.    Management is as per the blue box above.    # Muscle rigidity, increased tone and spasms   -    ACUTE = LESS THAN 1 YEAR DURATION  09/25/2022 had injection of desmoid tumors with steroid/anesthetic under US guidance. For desmoid tumors in right forearm and left chest wall. Had multiple injections like these in the past without complications.  Admitted on 7.19 for observations , her workup included MRI spine and CK which were unremarkable.  Management is as per the blue box above.    # Deranged LFT -  ACUTE = LESS THAN 1 YEAR DURATION  ALT 176 from 10/22/2022 now improving.    Management is as per the blue box above.      # Management of prescription antimicrobials needing intensive toxicity monitoring - acute, poses threat to life or bodily function  [high]  Beta-lactams (penicillins, cephalosporins, and carbapenems) can cause rashes, loose stools, nephrotoxicity, and/or myelosuppression.  Cefepime can cause various types of neurotoxicity, including depressed consciousness, encephalopathy, aphasia, myoclonus, seizures, and coma. Metronidazole can cause rashes, dysgeusia (altered taste), peripheral neuropathy, and/or myelosuppression.  Vancomycin can cause back pain, various dermatological effects, thrombocytopenia, neutropenia, and/or nephrotoxicity.   See recommendations in blue box above.      # Disposition  SUCCINCTLY explain what you anticipate dispo might be (e.g., OPAT candidacy).   TBD            Antimicrobials & Other Medications  ( .00IDGANTT  /  .00IDGANTTLIST  )     Current  Cefepime 8/13-  Metronidazole 8/13-  Vancomycin 8/13-    Previous  None     Immunomodulators and antipyretics  None       Current Medications as of 10/22/2022  Scheduled  PRN   baclofen, 15 mg, TID  bisacodyl, 10 mg, BID  buprenorphine-naloxone, 0.25 Film, TID  cefepime, 2 g,  Q8H  cetirizine, 10 mg, Daily  famotidine (PEPCID) IV, 20 mg, At bedtime  lactated ringers, 1,000 mL, Once  lidocaine, 8 mL, Once  magnesium oxide, 400 mg, Daily  metroNIDAZOLE, 500 mg, Q8H  mirtazapine, 15 mg, Nightly  naloxegol, 25 mg, Daily  [Provider Hold] nirogacestat, 50 mg, BID  pantoprazole (Protonix) intravenous solution, 40 mg, BID  polyethylene glycol, 17 g, BID  senna, 2 tablet, Nightly  vancomycin, 1,000 mg, Q8H      acetaminophen, 650 mg, Q6H PRN  aluminum-magnesium hydroxide-simethicone, 60 mL, Q6H PRN  calcium carbonate, 400 mg elem calcium, Daily PRN  Chemo Clarification Order, , Continuous PRN  fat emulsion 20 %, 100 mL, Once PRN  fat emulsion 20 %, 200 mL, Once PRN  guaiFENesin, 200 mg, Q4H PRN  HYDROmorphone, 1 mg, Q3H PRN  IP okay to treat, , Continuous PRN  LORazepam, 1 mg, Q8H PRN  LORazepam, 0.5 mg, TID PRN  midazolam, 2 mg, Once PRN  ondansetron, 4 mg, Q12H PRN  oxyCODONE, 10 mg, Q4H PRN  phenol, 2 spray, Q2H PRN  polyethylene glycol, 17 g, Daily PRN  promethazine, 6.5 mg, Q6H PRN  zinc oxide-cod liver oil, , Daily PRN           Physical Exam     Temp:  [37.7 ??C (99.9 ??F)-39.5 ??C (103.1 ??F)] 37.9 ??C (100.2 ??F)  Heart Rate:  [115-143] 117  SpO2 Pulse: [114-133] 114  Resp:  [8-30] 8  BP: (104-126)/(34-75) 104/49  MAP (mmHg):  [59-87] 69  SpO2:  [99 %-100 %] 99 %    Actual body weight: 60.2 kg (132 lb 11.5 oz)  Ideal body weight: 57 kg (125 lb 10.6 oz)  Adjusted ideal body weight: 58.3 kg (128 lb 7.7 oz)      Const [x]  vital signs above      []  WDWN, NAD, non-toxic appearance  [x]  Chronically ill appearing, oral thrush photo taken and uploaded onto EPIC media.      Eyes   [x]  Lids normal bilaterally, conjunctiva anicteric and noninjected OU  [x]  PERRL   []        ENMT     [x]  Normal appearance of external nose and ears       [x]  OP notable for white patch on tongue, no other visible lesions    [x]  MMM, no lesions on lips or gums, dentition good        [x]  Hearing normal   [x]  NG tube in place.      Neck    [x]  Neck of normal appearance and trachea midline        [x]  No thyromegaly, nodules, or tenderness   []        Lymph    [x]  No LAD in neck       []  No LAD in supraclavicular area       []  No LAD in axillae   []  No LAD in epitrochlear chains       []  No LAD in inguinal areas  []        CV    [x]  RRR, no m/r/g, S1/S2       [x]  No peripheral edema, WWP       [x]  Pedal pulses intact   []        Resp   [x]  Normal WOB       [x]  CTAB   []        GI   []  Normal inspection, NTND, NABS       []   No umbilical hernia on exam       []  No hepatosplenomegaly       []  Inspection of perineal and perianal areas normal  [x]  Scar in the RLQ, no obvious peristalsis shown on inspection, Abdomen tender on superficial palpation,has increased bowel sounds.      GU   []  Normal external genitalia       [x]  Deferred.      MSK   []  No clubbing or cyanosis of hands       []  No focal tenderness or abnormalities on palpation of joints in RUE, LUE, RLE, or LLE  [x]  Desmoid tumour on right arm and left side of chest.      Skin   [x]  No rashes, lesions, or ulcers of visualized skin       [x]  Skin warm and dry to palpation   []        Neuro   [x]  CNs II-XII grossly intact       [x]  Sensation to light touch grossly intact throughout   [x]  DTRs normal and symmetric throughout   []  Unable to assess due to critical illness, sedation, or mental status  []        Psych   [x]  Appropriate affect      [x]  Oriented to person, place, time      [x]  Judgment and insight are appropriate   []  Unable to assess due to critical illness, sedation, or mental status  []           Patient Lines/Drains/Airways Status       Active Active Lines, Drains, & Airways       Name Placement date Placement time Site Days    CVC Double Lumen 01/10/22 Tunneled Right Internal jugular 01/10/22  1353  Internal jugular  284    NG/OG Tube Decompression Right nostril 09/26/22  1000  Right nostril  26    Peripheral IV 10/15/22 Anterior;Right Forearm 10/15/22  0254  Forearm  7                      Data for ID Decision Making  ( IDGENCONMDM )       Micro & Serological Data   ( RSLTMICRO  /  [id number]  /  00CXSRC  /  00CXRES  /  00CXSUSC )    Microbiology Results (last day)       Procedure Component Value Date/Time Date/Time    Catheter Tip Culture [[id number]]     Lab Status: No result Specimen: Catheter tip, IV from TPN     Blood Culture #2 [[id number]]  (Normal) Collected: 10/21/22 0714    Lab Status: Preliminary result Specimen: Blood from 1 Peripheral Draw Updated: 10/22/22 0745     Blood Culture, Routine No Growth at 24 hours    Blood Culture #1 [[id number]]  (Abnormal) Collected: 10/21/22 0715    Lab Status: Preliminary result Specimen: Blood from 1 Peripheral Draw Updated: 10/22/22 0003     Blood Culture, Routine Positive Culture, Results to Follow     Gram Stain Result Gram negative rods (bacilli)    Blood Culture #2 [[id number]] Collected: 10/21/22 1815    Lab Status: In process Specimen: Blood from 1 Peripheral Draw Updated: 10/21/22 1834    Blood Culture #1 [[id number]] Collected: 10/21/22 1814    Lab Status: In process Specimen: Blood from 1 Peripheral Draw Updated: 10/21/22 1833    RAPID INFLUENZA/RSV/COVID PCR [[id number]]  (Normal) Collected: 10/21/22 1558    Lab Status:  Final result Specimen: Nasopharyngeal Swab Updated: 10/21/22 1723     SARS-CoV-2 PCR Negative     Influenza A Negative     Influenza B Negative     RSV Negative    Narrative:      This test was performed using the Cepheid Xpert Xpress CoV-2/Flu/RSV plus assay, which has been validated by the CLIA-certified, CAP-inspected [employer] FDA has granted Emergency Use Authorization for this test. Negative results do not preclude infection and should be interpreted along with clinical observations, patient history, and epidemiological information. Information for providers and patients can be found here: [email or web address]          7/19 Bcx NGTD  8/13 Bcx 1/2 GNR    Recent Studies  ( RISRSLT )    ECG 12 Lead    Result Date: 10/21/2022  SINUS TACHYCARDIA CANNOT RULE OUT ANTERIOR INFARCT  , AGE UNDETERMINED ABNORMAL ECG WHEN COMPARED WITH ECG OF 19-Oct-2022 11:22, PREVIOUS ECG HAS UNDETERMINED RHYTHM, NEEDS REVIEW QUESTIONABLE CHANGE IN QRS DURATION MINIMAL CRITERIA FOR ANTERIOR INFARCT  ARE NOW PRESENT CRITERIA FOR INFERIOR INFARCT ARE NO LONGER PRESENT    CT Abdomen Pelvis W Contrast    Result Date: 10/21/2022  EXAM: CT ABDOMEN PELVIS W CONTRAST ACCESSION: [id number] UN CLINICAL INDICATION: 22 years old with Abdominal pain, fevers; complicated PMHx, hx obstruction/partial obstruction, desmoid tumors      COMPARISON: CT abdomen pelvis 09/27/2022     TECHNIQUE: A helical CT scan of the abdomen and pelvis was obtained following IV contrast from the lung bases through the pubic symphysis. Images were reconstructed in the axial plane. Coronal and sagittal reformatted images were also provided for further evaluation.         FINDINGS:     LOWER CHEST: No focal consolidation, pleural effusion, or pneumothorax.     LIVER: Normal liver contour. Subcentimeter hypoattenuating hepatic lesions, too small to characterize on CT.     BILIARY: Layering sludge versus stones without gallbladder wall thickening. There is a small amount of pericholecystic fluid/right upper quadrant fluid which is similar to prior. No biliary ductal dilatation.      SPLEEN: Normal in size and contour.     PANCREAS: Normal pancreatic contour.  No focal lesions.  No ductal dilation.     ADRENAL GLANDS: Normal appearance of the adrenal glands.     KIDNEYS/URETERS: Symmetric renal enhancement.  No hydronephrosis. Unchanged size of 1.1 cm intermediate attenuation left renal lesion (2:46).     BLADDER: Mild circumferential bladder wall thickening which may be exaggerated underdistention.     REPRODUCTIVE ORGANS: Anteverted uterus. 3.7 cm left adnexal low attenuation lesion, consistent with an ovarian follicle/cyst, too small to require follow-up.     GI TRACT: Esophogastric tube tip in the distal stomach. Proctocolectomy with ileoanal anastomosis. There is oral contrast within the stomach, and mid and distal small bowel to the level of the anastomosis. Mild dilation of small bowel measuring up to 3.7 cm in diameter. There are regions of bowel tethering and multifocal narrowing associated with mesenteric mass described below. No discrete single transition point. Additionally, there is persistent dilation of distal ileum proximal to the ileoanal anastomosis (4:70). No bowel wall thickening.     PERITONEUM, RETROPERITONEUM AND MESENTERY: No free air.  No ascites.  No fluid collection. Similar size of ill-defined mesenteric mass centered in the lower central mesentery measuring approximately 2.8 x 2.5 cm (2:94) with associated tethering of bowel.     LYMPH NODES: No adenopathy by size  criteria. Similar multiple mildly prominent mesenteric nodes measuring up to 0.6 cm short axis.     VESSELS: Hepatic and portal veins are patent.  Normal caliber aorta.      BONES and SOFT TISSUES: No aggressive osseous lesions.  No focal soft tissue lesions. -Proctocolectomy with ileoanal anastomosis. Mildly dilated small bowel loops with intervening loops of tethered and narrowed small bowel associated with the known desmoid tumor. Contrast visualized in the distal ileum, ruling out complete obstruction.     -Dilation of distal ileum just proximal to the ileoanal anastomosis, similar to multiple priors, nonspecific. Recommend correlation for anastomotic stricture.     -Circumferential bladder wall thickening which may be exaggerated by underdistention. Recommend correlation with clinical symptoms and/or urinalysis for acute infection.         XR Abdomen Portable    Result Date: 10/20/2022  EXAM: XR ABDOMEN PORTABLE ACCESSION: [id number] UN     CLINICAL INDICATION: 22 years old with ABDOMINAL PAIN  -  UNSPECIFIED SITE      COMPARISON: 10/06/2022 abdominal radiograph     TECHNIQUE: Supine view of the abdomen, 2 image(s)     FINDINGS: The lung bases appear unremarkable. An esophagogastric tube with tip and sidehole overlying the stomach is identified. The tip overlies the distal stomach. Unchanged mild to moderately dilated loops of gas containing small bowel identified overlying the lower abdomen and upper pelvis similar to priors. Sequela of J-pouch creation in the pelvis. Otherwise the remaining small bowel loops are nondilated. Prominently distended bladder is identified. No evidence of acute osseous abnormalities.         An esophagogastric tube with tip and sidehole overlying the stomach is identified. The tip overlies the distal stomach. Unchanged mild to moderately dilated loops of gas containing small bowel are identified overlying the lower abdomen and upper pelvis similar to priors. This finding is not specific and could also be associated with postsurgical changes and/or paralytic ileus. Correlation with clinical findings recommended. Sequela of J-pouch creation in the pelvis. Otherwise the remaining small bowel loops are nondilated. Prominently distended bladder is identified.                   Initial Consult Documentation from October 22, 2022     Sources of information include: chart review, patient, and family/friend(s).    History of Present Illness:     22 y.o. 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    Patient has been admitted to [facility] since 7/18 with chronic abdominal pain and restarted on TPN in the setting of low PO intake. Patient was noted with chronic abdominal pain and nausea that is secondary to desmoid tumors and prior surgeries. More recently on 8/11, patient was noted with peristalsis in right lower quadrant. She was last noted with bowel movement on 8/11. Since, she has had worsening abdominal pain. Overnight of 8/12, patient was febrile to 38.7 despite tylenol and tachycardic to 140s. Since, patient has been having recurrent fevers and tachycardic with no leukocytosis. CTAP (8/13) was unremarkable for acute processes and demonstrated no small bowel obstruction. Blood cultures on 8/13 grew 1/2 GNR. Patient was started on cefepime, vancomycin, and metronidazole on 8/13. Right IJ was removed on 8/14.    Of note, pouchoscopy on 02/2022 showed 1cm of circumferential ulceration with severe, active pouchitis which  improved ~2 months later. Repeat pouchoscopy on 07/16/22 demonstrated small ulcerations on suture line and a rectal cuff beginning 1cm from anal verge and extending 2cm with healthy appearing mucosa. Patient has been on TPN since 09/28/22 through nontunneled right IJ that has been present since 01/2022.    Past Medical History   Patient  has a past medical history of Abdominal pain, Acid reflux, Anesthesia complication, Cancer (CMS-HCC), Cataract of right eye, COVID-19 virus infection (01/2019), Cyst of thyroid determined by ultrasound, [name], Difficult intravenous access, [name] (familial adenomatous polyposis), [name], Gastric polyps, History of chemotherapy, History of colon polyps, History of COVID-19 (01/2019), Iron deficiency anemia due to chronic blood loss, PONV (postoperative nausea and vomiting), Rectal bleeding, and Syncopal episodes.      Meds and Allergies  Patient has a current medication list which includes the following prescription(s): lorazepam, promethazine, acetaminophen, baclofen, cefdinir, cholecalciferol (vitamin d3 25 mcg (1,000 units)), clobetasol, diclofenac sodium, doxycycline, famotidine, hydrocortisone, lidocaine, lorazepam, mirtazapine, naloxone, nirogacestat, oxycodone, pimecrolimus, triamcinolone, and zinc oxide, and the following Facility-Administered Medications: acetaminophen, aluminum-magnesium hydroxide-simethicone, baclofen, bisacodyl, buprenorphine-naloxone, calcium carbonate, cefepime (MAXIPIME) 2 g in sodium chloride 0.9 % (NS) 100 mL IVPB-MBP, cetirizine, CHEMO CLARIFICATION ORDER, famotidine (pf), fat emulsion 20 %, fat emulsion 20 %, guaifenesin, hydromorphone (pf), IP OKAY TO TREAT, lactated ringers, lactated ringers, lidocaine, lorazepam, lorazepam, magnesium oxide, metronidazole, midazolam, mirtazapine, naloxegol, [Provider Hold] nirogacestat, ondansetron, oxycodone, pantoprazole, phenol, polyethylene glycol, polyethylene glycol, promethazine (PHENERGAN) 6.5 mg in sodium chloride (NS) 0.9 % 50 mL IVPB, senna, sodium chloride, vancomycin, and zinc oxide-cod liver oil.    Allergies: Adhesive tape-silicones; Ferrlecit [sodium ferric gluconat-sucrose]; Levofloxacin; Methylnaltrexone; Neomycin; Papaya; Morphine; Zosyn [piperacillin-tazobactam]; Compazine [prochlorperazine]; Iron analogues; Iron dextran; and Latex, natural rubber       Social History  Patient  reports that she has never smoked. She has been exposed to tobacco smoke. She has never used smokeless tobacco. She reports that she does not drink alcohol and does not use drugs.          Scribe's Attest:  [name] [name], MBBCh obtained and performed the history, physical exam and medical decision making elements that were entered into the chart. Documentation assistance was provided by me personally. Signed by Paulina Fusi, Scribe, on October 22, 2022 at 10:42 AM.     Provider???s Attestation:   Documentation assistance provided by the Scribe, Paulina Fusi. I was present during the time the encounter was [place], MBBCh. October 22, 2022 at 10:42 AM

## 2022-10-22 NOTE — Unmapped (Signed)
Treatment Plan    Fevering to >102, tachycardic. Greatest concern for line infection. Repeating blood cultures now to increase yield. Stop using central line. HOLD TPN. Start cefepime, vanc, flagyl. Will consult ID tomorrow morning.    See surgery notes re: abdominal pain/distension - no complete obstruction right now but remains at elevated risk for obstruction and there has been a clear and significant clinical change in her exam. Abdomen was flat and SOFT yesterday. Will try to minimize opiates, NG to LIS. GI advising to continue bowel regimen. Monitor very closely. Low threshold to repeat abdominal imaging. Should be PROMPTLY evaluated overnight if there are clinical changes.     Chauncy Lean, MD  [facility] Medicine

## 2022-10-22 NOTE — Unmapped (Signed)
Central Venous Catheter Insertion Procedure Note (CPT [phone] and [id number])    Pre-procedural Planning     Patient Name:: [name] [name]  Patient MRN: [id number]    Line type:  Triple Lumen    Indications:  Inadequate peripheral access    Known Bleeding Diathesis: Patient/caregiver denies any known bleeding or platelet disorder.     Antiplatelet Agents: This patient is not on an antiplatelet agent.    Systemic Anticoagulation: This patient is not on full systemic anticoagulation.    Significant Labs:  INR   Date Value Ref Range Status   07/21/2022 1.06  Final   10/31/2011 1.1  Final     PT   Date Value Ref Range Status   07/21/2022 11.8 9.9 - 12.6 sec Final   10/31/2011 12.1 9.5 - 12.7 SECONDS Final     APTT   Date Value Ref Range Status   05/02/2022 57.7 (H) 24.8 - 38.4 sec Final     Platelet   Date Value Ref Range Status   10/22/2022 154 150 - 450 10*9/L Final   06/13/2014 221 150 - 440 10*9/L Final       Consent: Informed consent was obtained from Patient after explanation of the risks and benefits of the procedure, including risk for including arterial injury, pneumothorax, local and catheter-related infection, thrombosis, or hematoma formation.  Alternatives were discussed and all questions answered.  Refer to the consent documentation.    Procedure Details     Time-out was performed immediately prior to the procedure.    The left internal jugular vein was identified using bedside ultrasound. This area was prepped and draped in the usual sterile fashion. Maximum sterile technique was used including antiseptics, cap, gloves, gown, hand hygiene, mask, and sterile sheet.  The patient was placed in Trendelenburg position. Local anesthesia with 1% lidocaine was applied subcutaneously then deep to the skin. The angiocath was then inserted into the internal jugular vein using ultrasound guidance. Unfortunately, unable to cannulate left side x3. We prepped the right internal jugular vein and accessed under ultrasound guidance. The angiocatheter placement was confirmed by manometry and the wire was imaged by ultrasound and noted to be properly positioned in the vein prior to dilation.                Using the Seldinger Technique a Triple Lumen was placed with each port easily flushed and freely drawing venous blood.    The catheter was secured with sutures, CHG dressings applied over the site.    Condition     The patient tolerated the procedure well and remains in the same condition as pre-procedure.    Complications and Recommendations     Complications:  None; patient tolerated the procedure well.    Plan:  CXR was ordered to verify catheter positioning. RIJ CVC tip present at the cavoatrial junction.    Requesting Service: [facility])    Time Requested: 0900  Time Completed: 1615  Comments: small target at left internal jugular    Resident(s) Performing Procedure: Emogene Morgan, MD  Resident Year: PGY1    Octavia Heir, MD was present and supervised this procedure

## 2022-10-22 NOTE — Unmapped (Signed)
[facility] - REMOVAL of Central Venous Access Consultation Note        Subjective:    Requesting Team: Medicine    Line to be Removed: Power Line - double lumen    Placement Date: 01/10/22 in the right internal jugular vein    Reason for Removal: 2  Bacteremia     Brief History: [name] [name] is a 22 y.o. 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 CT A/P not concerning for intraabdominal cause. Blood culture from 10/21/22 preliminarily growing GNRs; other infectious workup has been negative.   Patient seen in consultation at the request of primary care team for consideration for removal of tunneled line. Patient has a tunneled double-lumen Powerline in the right internal jugular vein that was placed by VIR on 01/10/22.   Patient is febrile and tachycardic. No leukocytosis. Tip to be cultured.     History of Long-term/Difficult Access:    RIJ DL PL 1/66/06  RIJ DL PL 3/0/16  RIJ DL PL 01/0/93    Objective:      Pertinent Laboratory Values:  WBC   Date Value Ref Range Status   10/22/2022 7.6 3.6 - 11.2 10*9/L Final   06/13/2014 6.8 4.5 - 13.0 10*9/L Final     HGB   Date Value Ref Range Status   10/22/2022 9.1 (L) 11.3 - 14.9 g/dL Final   23/55/7322 02.5 12.0 - 16.0 g/dL Final     HCT   Date Value Ref Range Status   10/22/2022 27.4 (L) 34.0 - 44.0 % Final   06/13/2014 43.8 36.0 - 46.0 % Final     Platelet   Date Value Ref Range Status   10/22/2022 154 150 - 450 10*9/L Final   06/13/2014 221 150 - 440 10*9/L Final     INR   Date Value Ref Range Status   07/21/2022 1.06  Final   10/31/2011 1.1  Final     Creatinine   Date Value Ref Range Status   10/22/2022 0.58 0.55 - 1.02 mg/dL Final   42/70/6237 6.28 0.30 - 0.90 mg/dL Final       Tip to be cultured:  Yes     Anticoagulation: No    Allergies:    Allergies   Allergen Reactions    Adhesive Tape-Silicones Hives and Rash     Paper tape  And tegederm ok    Ferrlecit [Sodium Ferric Gluconat-Sucrose] Swelling and Rash    Levofloxacin Swelling and Rash     Swelling in mouth, rash,     Methylnaltrexone      Per Patient: I lost bowel control, severe abdominal cramping, and elevated BP    Neomycin Swelling     Rxn after ear drops; ear swelling    Papaya Hives    Morphine Nausea And Vomiting    Zosyn [Piperacillin-Tazobactam] Itching and Rash     Red and itchy    Compazine [Prochlorperazine] Other (See Comments)     Extreme agitation    Iron Analogues     Iron Dextran Itching     Received iron dextran 06/08/22 over 12 hours, had itching and redness/flushing during the infusion and for a couple days after. Required IV benadryl w/flares between doses and ultimately treated w/IV methylpred for 2 days.     Latex, Natural  Rubber Rash       Pediatric Sedation Needed:  No    Assessment:     [name] [name] is a 22 y.o. 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   Patient has a tunneled double-lumen Powerline in the right internal jugular vein that was placed by VIR on 01/10/22. Consult for removal of tunneled line.   Tip to be sent for culture.     Plan/Recommendations:       VIR does recommend proceeding with Power Line - double lumen line removal.    -Anticipated Date:  8/14 urgent  Patient has peripheral IV access and team planning for bedside non-tunneled line placement    Anice Paganini, PA, October 22, 2022, 9:25 AM

## 2022-10-22 NOTE — Unmapped (Cosign Needed)
VIR Post-Procedure Note    Procedure Name: Tunneled Line Removal     Pre-Op Diagnosis: Bacteremia     Post-Op Diagnosis: Same as pre-operative diagnosis    VIR Providers    Attending: Dr. Claretta Fraise  Fellow/Resident: Dr. Leone Payor    Description of procedure: Bedside removal of tunneled Powerline from the right chest. Dry dressing appplied.     Sedation: None, lidocaine only    Estimated Blood Loss: Minimal  Specimens: None   Complications: None    Plan:  Continue dressing until 48 hours post-procedure and then change every 24-48 hours until incision heals (~2 weeks). Okay to change as needed for soilage. Okay to shower and pat dry, then replace dressing. Do not submerge incision in water until healed.       Odelia Gage, MD  [facility], PGY-6  10/22/2022 2:47 PM

## 2022-10-22 NOTE — Unmapped (Signed)
Problem: Adult Inpatient Plan of Care  Goal: Plan of Care Review  Outcome: Progressing  Goal: Patient-Specific Goal (Individualized)  Outcome: Progressing  Goal: Absence of Hospital-Acquired Illness or Injury  Outcome: Progressing  Intervention: Identify and Manage Fall Risk  Recent Flowsheet Documentation  Taken 10/21/2022 2000 by Gust Rung, RN  Safety Interventions:   fall reduction program maintained   low bed   lighting adjusted for tasks/safety   infection management  Intervention: Prevent Skin Injury  Recent Flowsheet Documentation  Taken 10/21/2022 2000 by Gust Rung, RN  Positioning for Skin: (pt turns self)   Supine/Back   Other (Comment)  Intervention: Prevent Infection  Recent Flowsheet Documentation  Taken 10/21/2022 2000 by Gust Rung, RN  Infection Prevention:   cohorting utilized   environmental surveillance performed   equipment surfaces disinfected   hand hygiene promoted   personal protective equipment utilized  Goal: Optimal Comfort and Wellbeing  Outcome: Progressing  Goal: Readiness for Transition of Care  Outcome: Progressing  Goal: Rounds/Family Conference  Outcome: Progressing

## 2022-10-22 NOTE — Unmapped (Signed)
Pt has patent IV in right forearm. Also pt has 2nd PIV in right forearm, but infiltrated. Assessed left arm with ultrasound, pt veins are too small or non compressible to place IV at this time. RN is aware and is going to notify MD.

## 2022-10-23 DIAGNOSIS — K638219 Small intestinal bacterial overgrowth (SIBO): Principal | ICD-10-CM

## 2022-10-23 LAB — PHOSPHORUS: PHOSPHORUS: 2.5 mg/dL (ref 2.4–5.1)

## 2022-10-23 LAB — BASIC METABOLIC PANEL
ANION GAP: 4 mmol/L — ABNORMAL LOW (ref 5–14)
BLOOD UREA NITROGEN: 7 mg/dL — ABNORMAL LOW (ref 9–23)
BUN / CREAT RATIO: 16
CALCIUM: 8.4 mg/dL — ABNORMAL LOW (ref 8.7–10.4)
CHLORIDE: 107 mmol/L (ref 98–107)
CO2: 25 mmol/L (ref 20.0–31.0)
CREATININE: 0.45 mg/dL — ABNORMAL LOW
EGFR CKD-EPI (2021) FEMALE: >90 mL/min/{1.73_m2} (ref >=60)
GLUCOSE RANDOM: 81 mg/dL (ref 70–179)
POTASSIUM: 3.7 mmol/L (ref 3.4–4.8)
SODIUM: 136 mmol/L (ref 135–145)

## 2022-10-23 LAB — MAGNESIUM: MAGNESIUM: 1.9 mg/dL (ref 1.6–2.6)

## 2022-10-23 LAB — CBC
HEMATOCRIT: 28.5 % — ABNORMAL LOW (ref 34.0–44.0)
HEMOGLOBIN: 9.3 g/dL — ABNORMAL LOW (ref 11.3–14.9)
MEAN CORPUSCULAR HEMOGLOBIN CONC: 32.7 g/dL (ref 32.0–36.0)
MEAN CORPUSCULAR HEMOGLOBIN: 26.7 pg (ref 25.9–32.4)
MEAN CORPUSCULAR VOLUME: 81.4 fL (ref 77.6–95.7)
MEAN PLATELET VOLUME: 11.9 fL — ABNORMAL HIGH (ref 6.8–10.7)
PLATELET COUNT: 136 10*9/L — ABNORMAL LOW (ref 150–450)
RED BLOOD CELL COUNT: 3.5 10*12/L — ABNORMAL LOW (ref 3.95–5.13)
RED CELL DISTRIBUTION WIDTH: 14.5 % (ref 12.2–15.2)
WBC ADJUSTED: 7 10*9/L (ref 3.6–11.2)

## 2022-10-23 LAB — VITAMIN B6: VITAMIN B6: 43 ug/L

## 2022-10-23 LAB — HEPATIC FUNCTION PANEL
ALBUMIN: 3.1 g/dL — ABNORMAL LOW (ref 3.4–5.0)
ALKALINE PHOSPHATASE: 99 U/L (ref 46–116)
ALT (SGPT): 137 U/L — ABNORMAL HIGH (ref 10–49)
AST (SGOT): 34 U/L (ref <=34)
BILIRUBIN DIRECT: 0.1 mg/dL (ref 0.00–0.30)
BILIRUBIN TOTAL: 0.3 mg/dL (ref 0.3–1.2)
PROTEIN TOTAL: 6 g/dL (ref 5.7–8.2)

## 2022-10-23 LAB — VITAMIN E: VITAMIN E LEVEL: 8.5 mg/L

## 2022-10-23 LAB — VITAMIN B2: VITAMIN B2: 6 ug/L

## 2022-10-23 MED ORDER — DOXYCYCLINE HYCLATE 100 MG TABLET
ORAL_TABLET | 0 refills | 0 days | Status: CP
Start: 2022-10-23 — End: ?

## 2022-10-23 MED ADMIN — ondansetron (ZOFRAN) injection 4 mg: 4 mg | INTRAVENOUS | @ 12:00:00

## 2022-10-23 MED ADMIN — baclofen (LIORESAL) tablet 15 mg: 15 mg | ORAL | @ 14:00:00 | Stop: 2022-10-23

## 2022-10-23 MED ADMIN — butalbital-acetaminophen-caffeine (ESGIC) per tablet 1 tablet: 1 | ORAL | @ 16:00:00 | Stop: 2022-10-23

## 2022-10-23 MED ADMIN — pantoprazole (Protonix) injection 40 mg: 40 mg | INTRAVENOUS | @ 01:00:00

## 2022-10-23 MED ADMIN — promethazine (PHENERGAN) 6.5 mg in sodium chloride (NS) 0.9 % 50 mL IVPB: 6.5 mg | INTRAVENOUS | @ 01:00:00

## 2022-10-23 MED ADMIN — HYDROmorphone (PF) (DILAUDID) injection 1 mg: 1 mg | INTRAVENOUS | @ 19:00:00 | Stop: 2022-11-04

## 2022-10-23 MED ADMIN — metroNIDAZOLE (FLAGYL) IVPB 500 mg: 500 mg | INTRAVENOUS | @ 01:00:00 | Stop: 2022-10-28

## 2022-10-23 MED ADMIN — metroNIDAZOLE (FLAGYL) IVPB 500 mg: 500 mg | INTRAVENOUS | @ 08:00:00 | Stop: 2022-10-28

## 2022-10-23 MED ADMIN — promethazine (PHENERGAN) 6.5 mg in sodium chloride (NS) 0.9 % 50 mL IVPB: 6.5 mg | INTRAVENOUS | @ 08:00:00

## 2022-10-23 MED ADMIN — mucositis mixture (with lidocaine): 10 mL | OROMUCOSAL | @ 21:00:00

## 2022-10-23 MED ADMIN — baclofen (LIORESAL) tablet 15 mg: 15 mg | ORAL | @ 17:00:00 | Stop: 2022-10-23

## 2022-10-23 MED ADMIN — acetaminophen (TYLENOL) tablet 650 mg: 650 mg | GASTROENTERAL | @ 01:00:00

## 2022-10-23 MED ADMIN — lactated Ringers infusion: 100 mL/h | INTRAVENOUS | @ 16:00:00

## 2022-10-23 MED ADMIN — bisacodyl (DULCOLAX) suppository 10 mg: 10 mg | RECTAL | @ 01:00:00

## 2022-10-23 MED ADMIN — cetirizine (ZYRTEC) tablet 10 mg: 10 mg | GASTROENTERAL | @ 14:00:00

## 2022-10-23 MED ADMIN — senna (SENOKOT) tablet 2 tablet: 2 | ORAL | @ 01:00:00

## 2022-10-23 MED ADMIN — micafungin (MYCAMINE) 100 mg in sodium chloride 0.9 % (NS) 100 mL IVPB-MBP: 100 mg | INTRAVENOUS | @ 01:00:00 | Stop: 2022-11-05

## 2022-10-23 MED ADMIN — famotidine (PF) (PEPCID) injection 20 mg: 20 mg | INTRAVENOUS | @ 01:00:00

## 2022-10-23 MED ADMIN — buprenorphine-naloxone (SUBOXONE) 2-0.5 mg SL film 0.5 mg of buprenorphine: .25 | SUBLINGUAL | @ 14:00:00

## 2022-10-23 MED ADMIN — naloxegol (MOVANTIK) tablet 25 mg: 25 mg | ORAL | @ 14:00:00

## 2022-10-23 MED ADMIN — HYDROmorphone (PF) (DILAUDID) injection 1 mg: 1 mg | INTRAVENOUS | @ 08:00:00 | Stop: 2022-11-04

## 2022-10-23 MED ADMIN — LORazepam (ATIVAN) injection 1 mg: 1 mg | INTRAVENOUS | @ 10:00:00

## 2022-10-23 MED ADMIN — HYDROmorphone (PF) (DILAUDID) injection 1 mg: 1 mg | INTRAVENOUS | @ 12:00:00 | Stop: 2022-11-04

## 2022-10-23 MED ADMIN — cefepime (MAXIPIME) 2 g in sodium chloride 0.9 % (NS) 100 mL IVPB-MBP: 2 g | INTRAVENOUS | @ 01:00:00 | Stop: 2022-10-28

## 2022-10-23 MED ADMIN — vancomycin (VANCOCIN) IVPB 1000 mg (premix): 1000 mg | INTRAVENOUS | @ 08:00:00 | Stop: 2022-10-29

## 2022-10-23 MED ADMIN — vancomycin (VANCOCIN) IVPB 1000 mg (premix): 1000 mg | INTRAVENOUS | @ 17:00:00 | Stop: 2022-10-29

## 2022-10-23 MED ADMIN — buprenorphine-naloxone (SUBOXONE) 2-0.5 mg SL film 0.5 mg of buprenorphine: .25 | SUBLINGUAL | @ 17:00:00

## 2022-10-23 MED ADMIN — HYDROmorphone (PF) (DILAUDID) injection 1 mg: 1 mg | INTRAVENOUS | @ 15:00:00 | Stop: 2022-11-04

## 2022-10-23 MED ADMIN — HYDROmorphone (PF) (DILAUDID) injection 1 mg: 1 mg | INTRAVENOUS | @ 05:00:00 | Stop: 2022-11-04

## 2022-10-23 MED ADMIN — oxyCODONE (ROXICODONE) immediate release tablet 10 mg: 10 mg | ORAL | @ 17:00:00 | Stop: 2022-10-28

## 2022-10-23 MED ADMIN — buprenorphine-naloxone (SUBOXONE) 2-0.5 mg SL film 0.5 mg of buprenorphine: .25 | SUBLINGUAL | @ 01:00:00

## 2022-10-23 MED ADMIN — cefepime (MAXIPIME) 2 g in sodium chloride 0.9 % (NS) 100 mL IVPB-MBP: 2 g | INTRAVENOUS | @ 16:00:00 | Stop: 2022-10-28

## 2022-10-23 MED ADMIN — metroNIDAZOLE (FLAGYL) IVPB 500 mg: 500 mg | INTRAVENOUS | @ 17:00:00 | Stop: 2022-10-28

## 2022-10-23 MED ADMIN — mirtazapine (REMERON) tablet 15 mg: 15 mg | GASTROENTERAL | @ 01:00:00

## 2022-10-23 MED ADMIN — promethazine (PHENERGAN) 6.5 mg in sodium chloride (NS) 0.9 % 50 mL IVPB: 6.5 mg | INTRAVENOUS | @ 14:00:00

## 2022-10-23 MED ADMIN — pantoprazole (Protonix) injection 40 mg: 40 mg | INTRAVENOUS | @ 14:00:00

## 2022-10-23 MED ADMIN — polyethylene glycol (MIRALAX) packet 17 g: 17 g | ORAL | @ 01:00:00

## 2022-10-23 MED ADMIN — vancomycin (VANCOCIN) IVPB 1000 mg (premix): 1000 mg | INTRAVENOUS | @ 01:00:00 | Stop: 2022-10-29

## 2022-10-23 MED ADMIN — HYDROmorphone (PF) (DILAUDID) injection 1 mg: 1 mg | INTRAVENOUS | @ 23:00:00 | Stop: 2022-11-04

## 2022-10-23 MED ADMIN — polyethylene glycol (MIRALAX) packet 17 g: 17 g | ORAL | @ 14:00:00

## 2022-10-23 MED ADMIN — baclofen (LIORESAL) tablet 15 mg: 15 mg | ORAL | @ 01:00:00

## 2022-10-23 MED ADMIN — LORazepam (ATIVAN) injection 1 mg: 1 mg | INTRAVENOUS | @ 17:00:00

## 2022-10-23 MED ADMIN — HYDROmorphone (PF) (DILAUDID) injection 1 mg: 1 mg | INTRAVENOUS | @ 01:00:00 | Stop: 2022-11-04

## 2022-10-23 MED ADMIN — cefepime (MAXIPIME) 2 g in sodium chloride 0.9 % (NS) 100 mL IVPB-MBP: 2 g | INTRAVENOUS | @ 08:00:00 | Stop: 2022-10-28

## 2022-10-23 NOTE — Unmapped (Signed)
Vancomycin Therapeutic Monitoring Pharmacy Note    [name] [name] is a 22 y.o. female starting vancomycin. Date of therapy initiation: 10/21/22    Indication: Bacteremia/Sepsis    Prior Dosing Information:  1250mg  loading dose, followed with 1000mg  q8h      Goals:  Therapeutic Drug Levels  Vancomycin trough goal: 10-15 mg/L    Additional Clinical Monitoring/Outcomes  Renal function, volume status (intake and output)    Results: Vancomycin level 9.7 mg/L, drawn appropriately    Wt Readings from Last 1 Encounters:   10/11/22 60.2 kg (132 lb 11.5 oz)     Creatinine   Date Value Ref Range Status   10/22/2022 0.58 0.55 - 1.02 mg/dL Final   22/04/5425 0.62 (L) 0.55 - 1.02 mg/dL Final   37/62/8315 1.76 (L) 0.55 - 1.02 mg/dL Final        Pharmacokinetic Considerations and Significant Drug Interactions:  Adult (calculated on 8/14): Vd = 47.4 L, ke = 0.167 hr-1  Concurrent nephrotoxic meds: not applicable    Assessment/Plan:  Recommendation(s)  Trough is therapeutic  Continue current regimen of vancomycin 1000mg  q8h  Serum creatinine has increased from baseline, continue to monitor, may require a random level if continues to climb  Estimated trough on recommended regimen:  10-11 mg/L    Follow-up  Level due: in 3-5 days OR sooner if creatinine continues to rise  A pharmacist will continue to monitor and order levels as appropriate    Please page service pharmacist with questions/clarifications.    Candise Che, PharmD,

## 2022-10-23 NOTE — Unmapped (Addendum)
Hospital Medicine Daily Progress Note    Assessment/Plan:    Principal Problem:    Other acute postprocedural pain  Active Problems:    [name]    Intestinal polyps    [name]    Neoplasm related pain    Physical deconditioning    History of colectomy    Intractable abdominal pain    Generalized anxiety disorder with panic attacks    SBO (small bowel obstruction) (CMS-HCC)    Small intestinal bacterial overgrowth (SIBO)   Malnutrition Evaluation as performed by RD, LDN: Patient does not meet AND/ASPEN criteria for malnutrition at this time (09/29/22 1439)             [name] [name] is a 22 year old woman w/ 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 She was put back on TPN and remained admitted due to abdominal pain and debilitating muscle spasms/rigidity of uncertain etiology. Hospital course now further complicated by high fevers in the setting of likely central line infection.    GNR bacteremia  Yeast on 1/4 cultures from 8/13  High fevers - resolving  Unclear source: high risk for line infection given she was on TPN, GN also raise concern for intraabdominal source given clinical changes over the past 48 hours. CT did show a few liver hypo-attenuating lesions too small to characterize. UA was abnormal but not grossly infected and she has no urinary symptoms. Overnight 8/13 bottle grew GNR and yeast. All 3 other bottles NGTD.   PLAN  - Consulted ID; they are following, thank you for your help!  - Central line exchanged 8/14 while on broad spectrum antibacterial coverage but not fungal  - Repeat cultures ordered for 8/17 AM (72 hours given shortage)  - Continue vanc, cefepime, flagyl (started 8/13 PM - )  - Continue micafungin (started 8/14 PM - )  - Will need TTE, RUQ [name], formal eye exam (likely tomorrow)   - Will send UA per ID request   - Remains appropriate for step down  - Tylenol prn fever, bolus fluids prn    Evolving RLQ abdominal pain  Chronic abdominal pain, nausea 2/2 desmoid tumors and prior surgeries  Ileus - recurrent, not severe  She is moving her bowels again but remains distended with some e/o dilation on imaging.   PLAN  - Resumed bowel regimen per GI recommendation  - mIVFs ordered, pain control  - Encouraging her to limit opiates as much as possible  - NG clamped for now    Muscle rigidity, increased tone and spasms   Occurred post-procedurally, with reassuring evaluation (MRI spine, neuro c/s, CK normal) at that time. Thought it was due to anesthesia (has had similar, though much more transient, episodes after anesthesia in the past). Increased tone remains quite severe and it has not improved - initial plan was to increase baclofen and monitor for improvement. Neurology re-engaged 8/11 and recommending further evaluation for a central process (MRI), nutritional deficiencies/excess levels. May also benefit from EMG at some point. Given her tumor syndrome, I do think it is important to make sure we are not missing something.   PLAN  - Appreciate chronic pain and PM&R  - Neurology re-engaged, anticipating further recommendations today  - Ordered MRI w/wo - pending, delayed due to IV iron  - Follow up nutritional studies  - Baclofen 15 mg TID; monitoring for side effects  -  Continue ativan TID prn (PTA med), but caution against escalating    Elevated LFTs - resolved  AST/ALT 400s and now nearly normal again. Mildly elevated a few days ago as well. Unclear etiology - unclear if this could be related to TPN/over feeding. Discussed with pharmacy/TPN - not much to do if that's the cause, they are monitoring as well. Mother also asked if it could be a side effect of her new chemotherapy, which pharmacy reports is possible.   Of note, CT a/p on 8/13 also noted small hypo-attenuating lesions too small to characterize. In context of her new GNR/yeast infection, ID recommending [name] of her liver. I think this is reasonable.  PLAN  - Plan to discuss with oncology re: if her chemotherapy could have caused the transaminitis, currently on hold anyway due to acute illness/ileus  - Continue to monitor q72 hours (now that it is resolve again)  - Liver ultrasound with doppler ordered    Syncope and pre-syncope, uncertain etiology  Possibly vasovagal vs adverse effect of medications vs deconditioning vs iron deficiency. Has improved with weaning opioids but still occurring, last incident 8/14 (LOC per nurse, witnessed) while on the commode. Previously worked up for PE, with negative CTA. TTE normal. Cosyntropin stim test and thyroid studies normal. Remains in sinus without events (pauses) on telemetry.  PLAN  - Limit polypharmacy  - S/p IV iron  - Bolus LR as needed    Iron deficiency anemia  Ferritin 5, iron saturation 6%. Has had prior reactions with numerous attempts to give IV iron.   - S/p Feraheme 1020 mg. Received pre-med protocol per A&I  - Would not give IV iron except when it cannot be avoided given severe itching/stress it causes her    Headaches  Minimal improvement with IV fluids, magnesium, IV Benadryl. Concerning for rebound/medication overuse headache. Headache has improved with Tylenol holiday and ketamine though not completely resolved. Though her chemotherapy also has headaches as a common side effect.  PLAN  - Enteral PRN tylenol, fioricet daily prn could be considered if remains severe  - Avoid NSAIDs, avoid IV benadryl    Desmoid tumors - [name]  - Restarting nirogacestat at lower dose of 50 mg BID on 8/8 after discussion with primary oncologist Dr. [name]  - ON HOLD    Chronic/stable/resolved problems:    SIBO: Hold home cefdinir, doxy    FEN: Encourage regular diet, TPN  VTE ppx: Lovenox while acutely infected to reduce risk of clots  Code status: Full code  Dispo: Home with HH PT/OT/TPN vs SNF pending ability to stand/walk and removal of NG tube    I personally spent 60 minutes face-to-face and non-face-to-face in the care of this patient, which includes all pre, intra, and post visit time on the date of service. This included talking to consultants, coordinating care, and talking to patient/family/RN about plan of care. All documented time was specific to the E/M visit and does not include any procedures that may have been performed.    Chauncy Lean, MD  Hospital Medicine   ___________________________________________________________________    Subjective:  Her high fevers have finally broke though continues to have low grade temps. HDS but remains tachycardic intermittently. Responds to fluids and pain control. Severe headaches.     Labs/Studies:  Labs per EMR and Reviewed (last 24hrs)    Objective:  BP 129/62  - Pulse 106  - Temp 37.7 ??C (99.9 ??F) (Oral)  - Resp 11  - Ht 165.1 cm (5'  5)  - Wt 60.2 kg (132 lb 11.5 oz)  - SpO2 99%  - BMI 22.09 kg/m??     GEN: NAD, fatigued, appears unwell   HEENT: NG tube in place (800cc bilious output overnight in canister), MMM, normal hearing  CV: Tachycardia, no murmurs  EXT: Grossly symmetric, very mild swelling in bilateral feet but not legs  ABD: Soft, mildly distended, tender in RLQ but not peritonitic, +bs (many)  SKIN: No rashes or erythema  NEURO: Muscle rigidity/increased tone in lower extremities, generalized ~4/5 weakness  PSYCH: Stressed, cooperative, appropriate

## 2022-10-23 NOTE — Unmapped (Signed)
Ophthalmology Consult Note      Requesting Attending Physician: Heriberto Antigua  Service Requesting Consult: Med Bernita Raisin [facility])   Consult Attending Physician: Dr. Mitzi Davenport    Assessment:  22 y.o. female with hx of right eye cataract and amblyopia who is presented for a dilated exam in the setting of fungemia.    Ophthalmology was consulted for assistance with evaluation and management.    1. 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    2. Hx of cataract, right eye   3. Hx of amblyopia, right eye   - Pt reports longstanding stability and annual follow up with outside ophthalmologist    Recommendations  - No acute ophthalmic intervention indicated  - Pt may follow up outpatient with her usual ophthalmologist    Loyal Buba, MD  Ophthalmology PGY-3  October 23, 2022   3:46 PM      __________________________________________________________________    Reason for Consult:  Fungemia    History of Present Illness:  [name] [name] is a 22 y.o. female whom we are asked to see in consultation to rule out fungal endophthalmitis.    [name] [name] had 1/4 blood cultures positive for yeast on 8/13.    She denies any changes in her vision and denies any eye pain.    Hospital Problem List:  Patient Active Problem List   Diagnosis    [name]    Intestinal polyps    [name]    Benign neoplasm of skin of trunk    Other benign neoplasm of connective and other soft tissue of upper limb, including shoulder    History of colonic polyps    [name] of skin    Thyroid cyst    Syncope    Chemotherapy-induced nausea    Neoplasm related pain    Iron deficiency anemia due to chronic blood loss    Dehydration    Physical deconditioning    History of colectomy    Acute deep vein thrombosis (DVT) of axillary vein of right upper extremity (CMS-HCC)    Severe protein-calorie malnutrition (CMS-HCC)    Winged scapula of right side    Lower abdominal pain    Intractable abdominal pain    Generalized anxiety disorder with panic attacks    Hyperglycemia    Hypoglycemia    SBO (small bowel obstruction) (CMS-HCC)    GI bleeding    MDD (major depressive disorder)    Small intestinal bacterial overgrowth (SIBO)    Ileus (CMS-HCC)    Hypersensitivity reaction    Anemia    On total parenteral nutrition (TPN)    Urinary retention    High risk medication use    Other acute postprocedural pain       History:    Past Ocular History:  Cataract right eye that began around age 50 or 5  Right eye amblyopia    Past Medical History:  Past Medical History:   Diagnosis Date    Abdominal pain     Acid reflux     occas    Anesthesia complication     itching, shaking, coldness; last few surgeries have gone much better    Cancer (CMS-HCC)     Cataract of right eye     COVID-19 virus infection 01/2019    Cyst of thyroid determined by ultrasound     monitoring    [name]     2 right forearm, 1 left thigh,  1 right scapula, 1 under left clavicle; multiple    Difficult intravenous access     [name] (familial adenomatous polyposis)     [name]     Gastric polyps     History of chemotherapy     last treatment approx 05/2019    History of colon polyps     History of COVID-19 01/2019    Iron deficiency anemia due to chronic blood loss     received iron infusion 11-2019    PONV (postoperative nausea and vomiting)     Rectal bleeding     Syncopal episodes     especially if becoming dehydrated       Past Surgical History:  Past Surgical History:   Procedure Laterality Date    COLON SURGERY      cyroablation      cystis removal      desmoid removal      PR CLOSE ENTEROSTOMY,RESEC+ANAST N/A 10/09/2020    Procedure: ILEOSTOMY TAKEDOWN;  Surgeon: Mickle Asper, MD;  Location: OR [place];  Service: General Surgery    PR COLONOSCOPY W/BIOPSY SINGLE/MULTIPLE N/A 10/27/2012    Procedure: COLONOSCOPY, FLEXIBLE, PROXIMAL TO SPLENIC FLEXURE; WITH BIOPSY, SINGLE OR MULTIPLE;  Surgeon: Shirlyn Goltz Mir, MD;  Location: PEDS PROCEDURE ROOM [place];  Service: Gastroenterology    PR COLONOSCOPY W/BIOPSY SINGLE/MULTIPLE N/A 09/14/2013    Procedure: COLONOSCOPY, FLEXIBLE, PROXIMAL TO SPLENIC FLEXURE; WITH BIOPSY, SINGLE OR MULTIPLE;  Surgeon: Shirlyn Goltz Mir, MD;  Location: PEDS PROCEDURE ROOM [place];  Service: Gastroenterology    PR COLONOSCOPY W/BIOPSY SINGLE/MULTIPLE N/A 11/08/2014    Procedure: COLONOSCOPY, FLEXIBLE, PROXIMAL TO SPLENIC FLEXURE; WITH BIOPSY, SINGLE OR MULTIPLE;  Surgeon: Arnold Long Mir, MD;  Location: PEDS PROCEDURE ROOM [facility];  Service: Gastroenterology    PR COLONOSCOPY W/BIOPSY SINGLE/MULTIPLE N/A 12/26/2015    Procedure: COLONOSCOPY, FLEXIBLE, PROXIMAL TO SPLENIC FLEXURE; WITH BIOPSY, SINGLE OR MULTIPLE;  Surgeon: Arnold Long Mir, MD;  Location: PEDS PROCEDURE ROOM [facility];  Service: Gastroenterology    PR COLONOSCOPY W/BIOPSY SINGLE/MULTIPLE N/A 09/02/2017    Procedure: COLONOSCOPY, FLEXIBLE, PROXIMAL TO SPLENIC FLEXURE; WITH BIOPSY, SINGLE OR MULTIPLE;  Surgeon: Arnold Long Mir, MD;  Location: PEDS PROCEDURE ROOM [place];  Service: Gastroenterology    PR COLSC FLX W/REMOVAL LESION BY HOT BX FORCEPS N/A 08/27/2016    Procedure: COLONOSCOPY, FLEXIBLE, PROXIMAL TO SPLENIC FLEXURE; W/REMOVAL TUMOR/POLYP/OTHER LESION, HOT BX FORCEP/CAUTE;  Surgeon: Arnold Long Mir, MD;  Location: PEDS PROCEDURE ROOM [facility];  Service: Gastroenterology    PR COLSC FLX W/RMVL OF TUMOR POLYP LESION SNARE TQ N/A 02/25/2019    Procedure: COLONOSCOPY FLEX; W/REMOV TUMOR/LES BY SNARE;  Surgeon: Helyn Numbers, MD;  Location: [facility] [place] [facility];  Service: Gastroenterology    PR COLSC FLX W/RMVL OF TUMOR POLYP LESION SNARE TQ N/A 03/13/2020    Procedure: COLONOSCOPY FLEX; W/REMOV TUMOR/LES BY SNARE;  Surgeon: Helyn Numbers, MD;  [facility] [facility];  Service: Gastroenterology    PR EXC SKIN BENIG 2.1-3 CM TRUNK,ARM,LEG Right 02/25/2017    Procedure: EXCISION, BENIGN LESION INCLUDE MARGINS, EXCEPT SKIN TAG, LEGS; EXCISED DIAMETER 2.1 TO 3.0 CM;  Surgeon: Clarene Duke, MD;  Location: CHILDRENS OR [facility];  Service: Plastics    PR EXC SKIN BENIG 3.1-4 CM TRUNK,ARM,LEG Right 02/25/2017    Procedure: EXCISION, BENIGN LESION INCLUDE MARGINS, EXCEPT SKIN TAG, ARMS; EXCISED DIAMETER 3.1 TO 4.0 CM;  Surgeon: Clarene Duke, MD;  Location: CHILDRENS OR [place];  Service: Plastics    PR EXC SKIN  BENIG >4 CM FACE,FACIAL Right 02/25/2017    Procedure: EXCISION, OTHER BENIGN LES INCLUD MARGIN, FACE/EARS/EYELIDS/NOSE/LIPS/MUCOUS MEMBRANE; EXCISED DIAM >4.0 CM;  Surgeon: Clarene Duke, MD;  Location: CHILDRENS OR [facility];  Service: Plastics    PR EXC TUMOR SOFT TISSUE LEG/ANKLE SUBQ 3+CM Right 08/05/2019    Procedure: EXCISION, TUMOR, SOFT TISSUE OF LEG OR ANKLE AREA, SUBCUTANEOUS; 3 CM OR GREATER;  Surgeon: Arsenio Katz, MD;  Location: MAIN OR [place];  Service: Plastics    PR EXC TUMOR SOFT TISSUE LEG/ANKLE SUBQ <3CM Right 08/05/2019    Procedure: EXCISION, TUMOR, SOFT TISSUE OF LEG OR ANKLE AREA, SUBCUTANEOUS; LESS THAN 3 CM;  Surgeon: Arsenio Katz, MD;  Location: MAIN OR [facility];  Service: Plastics    PR LAP, SURG PROCTECTOMY W J-POUCH N/A 08/10/2020    Procedure: ROBOTIC ASSISTED LAPAROSCOPIC PROCTOCOLECTOMY, ILEAL J POUCH, WITH OSTOMY;  Surgeon: Mickle Asper, MD;  Location: OR [place];  Service: General Surgery    PR NDSC EVAL INTSTINAL POUCH DX W/COLLJ SPEC SPX N/A 01/23/2021    Procedure: ENDO EVAL SM INTEST POUCH; DX;  Surgeon: Modena Nunnery, MD;  [facility] [facility];  Service: Gastroenterology    PR NDSC EVAL INTSTINAL POUCH DX W/COLLJ SPEC SPX N/A 08/27/2021    Procedure: ENDO EVAL SM INTEST POUCH; DX;  Surgeon: Hunt Oris, MD;  [facility]: [facility];  Service: Gastroenterology    PR NDSC EVAL INTSTINAL POUCH DX W/COLLJ SPEC SPX N/A 12/09/2021    Procedure: ENDO EVAL SM INTEST POUCH; DX;  Surgeon: Vidal Schwalbe, MD;  Location: [facility] PROC[facility]URES [facility];  Service: Gastroenterology    PR NDSC EVAL INTSTINAL POUCH DX W/COLLJ [facility] SPX Left 04/09/2022    Procedure: ENDO EVAL SM INTEST POUCH; DX;  Surgeon: Modena Nunnery, MD;  [facility] [facility];  Service: Gastroenterology    PR NDSC EVAL INTSTINAL POUCH DX W/COLLJ SPEC SPX N/A 08/05/2022    Procedure: ENDO EVAL SM INTEST POUCH; DX;  Surgeon: Modena Nunnery, MD;  Location: GI PROC[facility]URES [facility];  Service: Gastroenterology    PR NDSC EVAL INTSTINAL POUCH W/BX SINGLE/MULTIPLE N/A 01/20/2022    Procedure: ENDOSCOPIC EVAL OF SMALL INTESTINAL POUCH; DIAGNOSTIC, No biopsies;  Surgeon: Andrey Farmer, MD;  [facility]: [facility]URES [facility]  Service: Gastroenterology    PR NDSC EVAL INTSTINAL POUCH W/BX SINGLE/MULTIPLE N/A 02/13/2022    Procedure: ENDOSCOPIC EVAL OF SMALL INTESTINAL POUCH; DIAGNOSTIC, WITH BIOPSY;  Surgeon: Bronson Curb, MD;  Location: GI PROCEDURES [facility];  Service: Gastroenterology    PR UNLISTED PROCEDURE SMALL INTESTINE  01/23/2021    Procedure: UNLISTED PROCEDURE, SMALL INTESTINE;  Surgeon: Modena Nunnery, MD;  [facility]: [facility] [facility];  Service: Gastroenterology    PR UNLISTED PROCEDURE SMALL INTESTINE  02/13/2022    Procedure: UNLISTED PROCEDURE, SMALL INTESTINE;  Surgeon: Bronson Curb, MD;  Location: GI PROCEDURES [facility];  Service: Gastroenterology    PR UPPER GI ENDOSCOPY,BIOPSY N/A 10/27/2012    Procedure: UGI ENDOSCOPY; WITH BIOPSY, SINGLE OR MULTIPLE;  Surgeon: Shirlyn Goltz Mir, MD;  Location: PEDS PROCEDURE ROOM [facility];  Service: Gastroenterology    PR UPPER GI ENDOSCOPY,BIOPSY N/A 09/14/2013    Procedure: UGI ENDOSCOPY; WITH BIOPSY, SINGLE OR MULTIPLE;  Surgeon: Shirlyn Goltz Mir, MD;  Location: PEDS PROCEDURE ROOM [facility];  Service: Gastroenterology    PR UPPER GI ENDOSCOPY,BIOPSY N/A 11/08/2014    Procedure: UGI ENDOSCOPY; WITH BIOPSY, SINGLE OR MULTIPLE;  Surgeon: Arnold Long Mir,  MD; Location: PEDS PROCEDURE ROOM [place];  Service: Gastroenterology    PR UPPER GI ENDOSCOPY,BIOPSY N/A 12/26/2015    Procedure: UGI ENDOSCOPY; WITH BIOPSY, SINGLE OR MULTIPLE;  Surgeon: Arnold Long Mir, MD;  Location: PEDS PROCEDURE ROOM [facility];  Service: Gastroenterology    PR UPPER GI ENDOSCOPY,BIOPSY N/A 08/27/2016    Procedure: UGI ENDOSCOPY; WITH BIOPSY, SINGLE OR MULTIPLE;  Surgeon: Arnold Long Mir, MD;  Location: PEDS PROCEDURE ROOM [facility];  Service: Gastroenterology    PR UPPER GI ENDOSCOPY,BIOPSY N/A 09/02/2017    Procedure: UGI ENDOSCOPY; WITH BIOPSY, SINGLE OR MULTIPLE;  Surgeon: Arnold Long Mir, MD;  Location: PEDS PROCEDURE ROOM [facility];  Service: Gastroenterology    PR UPPER GI ENDOSCOPY,BIOPSY N/A 03/13/2020    Procedure: UGI ENDOSCOPY; WITH BIOPSY, SINGLE OR MULTIPLE;  Surgeon: Helyn Numbers, MD;  [facility]: [facility] [facility];  Service: Gastroenterology    PR UPPER GI ENDOSCOPY,BIOPSY N/A 09/05/2021    Procedure: UGI ENDOSCOPY; WITH BIOPSY, SINGLE OR MULTIPLE;  Surgeon: Wendall Papa, MD;  Location: [facility]URES [facility];  Service: Gastroenterology    PR UPPER GI ENDOSCOPY,DIAGNOSIS N/A 01/20/2022    Procedure: UGI ENDO, INCLUDE ESOPHAGUS, STOMACH, & DUODENUM &/OR JEJUNUM; DX W/WO COLLECTION SPECIMN, BY BRUSH OR WASH;  Surgeon: Andrey Farmer, MD;  Location: GI PROCEDURES [facility];  Service: Gastroenterology    TUMOR REMOVAL      multiple-head, neck, back, hand, right flank, multiple       Family History:  Negative family ocular history    Social History:  Social History     Socioeconomic History    Marital status: Single   Tobacco Use    Smoking status: Never     Passive exposure: Past    Smokeless tobacco: Never   Vaping Use    Vaping status: Never Used   Substance and Sexual Activity    Alcohol use: Never    Drug use: Never    Sexual activity: Never   Other Topics Concern    Do you use sunscreen? Yes    Tanning bed use? No    Are you easily burned? No    Excessive sun exposure? No    Blistering sunburns? 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     Social Determinants of Health     Financial Resource Strain: Low Risk  (09/26/2022)    Overall Financial Resource Strain (CARDIA)     Difficulty of Paying Living Expenses: Not very hard   Food Insecurity: No Food Insecurity (09/26/2022)    Hunger Vital Sign     Worried About Running Out of Food in the Last Year: Never true     Ran Out of Food in the Last Year: Never true   Transportation Needs: No Transportation Needs (09/26/2022)    PRAPARE - Therapist, art (Medical): No     Lack of Transportation (Non-Medical): No     Unable to assess tobacco use    Medications:  Scheduled Meds:   baclofen  15 mg Oral TID    bisacodyl  10 mg Rectal BID    buprenorphine-naloxone  0.25 Film Sublingual TID    cefepime  2 g Intravenous Q8H    cetirizine  10 mg Enteral tube: gastric Daily    [Provider Hold] enoxaparin (LOVENOX) injection  40 mg Subcutaneous Q24H SCH    famotidine (PEPCID) IV  20 mg Intravenous At bedtime    metroNIDAZOLE  500 mg Intravenous Q8H    micafungin  100 mg Intravenous Q24H    mirtazapine  15 mg Enteral tube: gastric Nightly    naloxegol  25 mg Oral Daily    [Provider Hold] nirogacestat  50 mg Oral BID    pantoprazole (Protonix) intravenous solution  40 mg Intravenous BID    polyethylene glycol  17 g Oral BID    senna  2 tablet Oral Nightly    vancomycin  1,000 mg Intravenous Q8H     Continuous Infusions:   Chemo Clarification Order      IP okay to treat      lactated Ringers 100 mL/hr (10/23/22 1215)    sodium chloride       PRN Meds:.acetaminophen, aluminum-magnesium hydroxide-simethicone, calcium carbonate, Chemo Clarification Order, fat emulsion 20 %, fat emulsion 20 %, guaiFENesin, HYDROmorphone, IP okay to treat, LORazepam, LORazepam, lidocaine-diphenhydrAMINE-aluminum-magnesium, ondansetron, oxyCODONE, phenol, polyethylene glycol, promethazine, pseudoephedrine, zinc oxide-cod liver oil    Allergies:  Allergies   Allergen Reactions    Adhesive Tape-Silicones Hives and Rash     Paper tape and Tegederm ok. Biopatch causes blistering but has tolerated CHG Tegaderm    Ferrlecit [Sodium Ferric Gluconat-Sucrose] Swelling and Rash    Levofloxacin Swelling and Rash     Swelling in mouth, rash,     Methylnaltrexone      Per Patient: I lost bowel control, severe abdominal cramping, and elevated BP    Neomycin Swelling     Rxn after ear drops; ear swelling    Papaya Hives    Morphine Nausea And Vomiting    Zosyn [Piperacillin-Tazobactam] Itching and Rash     Red and itchy    Compazine [Prochlorperazine] Other (See Comments)     Extreme agitation    Iron Analogues     Iron Dextran Itching     Received iron dextran 06/08/22 over 12 hours, had itching and redness/flushing during the infusion and for a couple days after. Required IV benadryl w/flares between doses and ultimately treated w/IV methylpred for 2 days.     Latex, Natural Rubber Rash       Review of Systems:  12 systems reviewed and negative unless otherwise stated in HPI or recent HPI    Physical Exam:  Temp Readings from Last 2 Encounters:   10/23/22 36.8 ??C (98.3 ??F) (Oral)   09/16/22 36.6 ??C (97.9 ??F)     BP Readings from Last 2 Encounters:   10/23/22 129/62   09/24/22 122/90     Pulse Readings from Last 2 Encounters:   10/23/22 103   09/24/22 97     Resp Readings from Last 2 Encounters:   10/23/22 15   09/02/22 14     SpO2 Readings from Last 1 Encounters:   10/23/22 99%       General:   No acute distress    Neuro/Psych:  Alert and oriented to person, place, and time    Ophthalmic Exam:  Base Eye Exam       Visual Acuity (Snellen - Linear)         Right Left    [place] 20/50-2 20/20   Right eye with known amblyopia             Tonometry ([facility], 3:27 PM)         Right Left    Pressure 17 18  Pupils         Shape React APD    Right Round Brisk None Left Round Brisk None              Visual Fields (Counting fingers)         Left Right     Full Full              Extraocular Movement         Right Left     Full, Ortho Full, Ortho              Neuro/Psych       Oriented x3: Yes    Mood/Affect: Normal              Dilation       Both eyes: 1% Tropicamide, 10% Phenylephrine @ 3:29 PM                  Slit Lamp and Fundus Exam       External Exam         Right Left    External Normal Normal              Slit Lamp Exam         Right Left    Lids/Lashes Normal Normal    Conjunctiva/Sclera White and quiet White and quiet    Cornea Clear Clear    Anterior Chamber Deep and quiet Deep and quiet    Iris Round and reactive Round and reactive    Lens Superocentral posterior polar cataract Clear    Anterior Vitreous Normal Normal              Fundus Exam         Right Left    Disc Normal Normal    C/D Ratio 0.2 0.2    Macula Normal Normal    Vessels Normal Normal    Periphery Normal, no vitreitis or retinitis Normal, no vitreitis or retinitis                    Diagnostic Testing:  All pertinent labs and imaging results reviewed    __________________________________________________________________

## 2022-10-23 NOTE — Unmapped (Signed)
Physical Medicine and Rehab  Inpatient Consult Note    Requesting Attending Physician: Suzzanne Cloud, MD  Service Requesting Consult: Med [facility])    Thank you for this consult.  Please contact the PM&R consult pager [phone]) for questions regarding these recommendations.  For questions of bed availability at [facility], contact the Intake Office at [phone]. Please contact your case manager for updates on AIR process as they are in regular contact with the rehab intake office and should have the latest information.    *This PM&R consult does not guarantee that patient has been accepted to [facility] AIR, 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 Patient is currently hospitalized since 09/25/2022. The patient is seen in consultation for evaluation of rehabilitation needs.    Checklist for Potential AIR:   [ ]  EDD/what is estimated discharge date?  [ ]  Referral from CM if wants [facility]  [ ]  PT/OT within 48 hours of AIR  [ ]  CBC/BMP & other pertinent labs within 48 hours of AIR  [ ]  Prefer oral pain meds only, but if on IV pain meds, then no more than q 6-8 hours prn  [ ]  dispo verified and [facility] preference  [ ]  Currently necessitating TPN.  Needs finalized TPN and NGT plan. Will need a TPN transition note prior to discharge.  [ ]  Documentation of therapy tolerance    Dispo: home with family. She previously was independent with basic ADLs. Has MSK limitations which required some assistance. She is in RN school    Principal Medical Problem:  Other acute postprocedural pain    Increased Tonicity  Dystonia   Started post-procedurally per patient ~09/25/22. Occurs intermittently throughout the day with a heightened occurrence during mobilization. At rest patient bilateral ankles in plantarflexion with worsening heel cord tightening. Has tonicity most prominent to HF and KE that is not velocity dependent. Has not noted significant improvement with increase of baclofen to 15 mg TID in setting of infection as below. Differential wise, Neurology previously evaluated patient seemingly ruling out an acute dystonic reaction, cord compression, a paraneoplastic etiology. Neurology recommended further evaluation for central process. B2, Niacin, Folate, Vit D, Copper WNL. Zinc low to 55.   - Recommend increasing Baclofen to 20 mg TID  - At risk for contractures, Recommend ordering bilateral hard PRAFO boots  - Pending MRI Brain w/wo for evaluation of central process  - Pending Vit B1, B6, E; manganese, zinc   - PO Ativan 0.5 mg TID PRN for anxiety and IV Ativan 1 mg q8h prn for resistant nausea and vomiting may help with dystonia     Headache  Currently managed with tylenol. She does have some posterior neck muscle tightness. Notes ketamine is helpful for headache pains. Describes photophobia and phonophobia with events. I discussed with patient our hope is to decrease risk of rebound headaches and limit medical issues which will interfere with therapy. An additional thought is patient's headaches could be cervicogenic. She does have loss of cervical lordosis on most recent MRI.   - Given patient with elevated heart rate, frequent headaches, and has limited therapy  participation 2/2 tachycardia in setting of continuous headaches, would recommend propranolol 5 mg BID for headache prophylaxis. It is reasonable to increase to 10 mg BID if patient tolerates 5 mg with therapy.  - Prn tylenol +/- prn fiorcet   - Can consider using lidocaine cream PRN to area of pain for cervicogenic headaches    Pain  - Tylenol 650 mg q6h PRN  - IV Dilaudid 1 mg q3h PRN  - Oxyocodone 10 mg q4h PRN  - Suboxone 0.5 mg TID  - Recommend starting Gabapentin 100 mg TID, can increase to 300mg  TID as tolerated    Multifactorial Ileus   Coffee Ground Emesis 2/2 NGT placement (resolved)  Contributors with history of narcotics, poor PO intake. Concern for malnutrition. Improvements to abdominal pain since weaning opioids. Now on TPN.  -TPN (duration)  -s/p opioid wean thus d/c relistor   -bowel regimen: suppository BID  -NG tube in place, NG tube clamped  -Trialing food/meds PO prior to NG tube removal   -IV PPI BID   -pain management following, patient On ketamine    Syncope  Multifactorial. Primary team ddx includes: vasovagal vs adverse effect of medications vs deconditioning vs iron deficiency. Last echo 04/2022  -work-up ongoing with plans for cosyntropin stim test and +/- repeat echo    IDA  -has allergy to formula iron     [name] - Desmoid Tumors  Dr. [name] is patient's outpatient oncologist.   -Pnirogacestat at 50 mg dose if able per primary team     GNR bacteremia  Yeast on 1/4 cultures from 8/13  High fevers - resolving  Unclear source: high risk for line infection given she was on TPN, GN also raise concern for intraabdominal source given clinical changes over the past 48 hours. CT did show a few liver hypo-attenuating lesions too small to characterize. UA was abnormal but not grossly infected and she has no urinary symptoms. Overnight 8/13 bottle grew GNR and yeast. All 3 other bottles NGTD. ID consulted with recommendations as below.   - Central line exchanged 8/14 while on broad spectrum antibacterial coverage but not fungal  - Repeat cultures ordered for 8/17 AM (72 hours given shortage)  - Continue cefepime, flagyl (started 8/13 PM - )  - Continue micafungin (started 8/14 PM - )  - Pending TTE  - Pending Ucx per ID request   - Tylenol prn fever, bolus fluids prn    Other Medical Considerations:  While in AIR, the patient has medical issues requiring active management/supervision by a rehabilitation physician, with face-to-face visits at least 3 days per week to assess the patient both medically and functionally and to modify the course of treatment as needed; The Physiatrist leads an intensive and coordinated interdisciplinary team approach to the delivery of inpatient rehabilitative care that cannot be provided in a less intensive setting such as the home, outpatient center or SNF. If not done in hospital, patient is at high risk for falls, respiratory failure, bleeding, infections, or delirium.  Risk for VTE    Patient's Functional Goals  Increase mobility, return to RN school    Functional Status Updates:  Prior Functional Status:    Prior Functional Status: Pt reports following her last hospitalization she was able to go to the beach briefly and ambulate on the sand. Was also able to play mini-golf but unable to bend down to grab a ball. Since her egg retrieval and the initiation of her new chemo medication, she has been spending  most of her time in bed. No falls in the past month, but did have a few falls last hospitalization due to syncope.    Current Functional Status: Therapy notes reviewed and analyzed   Activities of Daily Living:   Assessment  Problem List: Decreased strength, Impaired ADLs, Decreased activity tolerance, Decreased mobility, Fall risk, Gait deviation, Impaired balance, Pain, Decreased endurance, Decreased coordination  Assessment: Pt highly motivated to wash hair and shave her legs this session. Pt's mother helpful and attentive throughout session. pt encouraged to complete as much of task as she can independently. Pt verbalized understanding. Pt continues to benefit from skilled acute OT; will continue to follow  Today's Interventions: ADL retraining, Balance activities, Bed mobility, Compensatory tech. training, Conservation, Education - Patient, Education - Family / caregiver, Endurance activities, Safety education  Chlorhexidine Treatment: Declined  Today's Interventions: Educated on role of OT, OT POC, anxiety management, PLB, activity pacing, seated rest breaks. Patient performed bed mobility, sit<>stand transitions, step up/down, functional mobility, RW management            Mobility:   Bed Mobility comments: Supine to sit with SBA, HOB elevated and increased time to complete. Pt utilizing LUE for support but protecting R wrist. Sit to supine with Min A for LEs.        Skilled Treatment Performed: see gait goal  PT Post Acute Discharge Recommendations: Skilled PT services indicated, 5x weekly, High intensity  Cognition, Swallow, Speech:   Cognition / Swallow / Speech  Patient's Vision Adequate to Safely Complete Daily Activities: Yes  Patient's Judgement Adequate to Safely Complete Daily Activities: Yes  Patient's Memory Adequate to Safely Complete Daily Activities: Yes  Patient Able to Express Needs/Desires: Yes  Patient has speech problem: No           Assistive Devices: Rollator  Precautions:  Safety Interventions  Safety Interventions: aspiration precautions, bleeding precautions, bed alarm, commode/urinal/bedpan at bedside, fall reduction program maintained, family at bedside, low bed, room near unit station, toileting scheduled, infection management, nonskid shoes/slippers when out of bed, lighting adjusted for tasks/safety, latex precautions  Aspiration Precautions: oral hygiene care promoted, awake/alert before oral intake, distractions minimized during oral intake, medication route adjusted, respiratory status monitored, tube feeding placement verified  Bleeding Precautions: blood pressure closely monitored, monitored for signs of bleeding  Infection Management: aseptic technique maintained  Isolation Precautions: protective precautions maintained  Latex Precautions: latex precautions maintained    Functional Impairments and Recommendations  - Please continue to have patient work with PT, OT, and SLP to maximize functional status with mobility, ADLs, and cogniton/swallow function.  - Fall risk precautions  - Recommend OOB to chair daily with assistance if appropriate    Rehabilitation Plan of Care  - Patient has complex rehab, nursing, and medical needs and may become appropriate for Acute Inpatient Rehabilitation with regular therapy participation / demonstrated progress, completion of medical workup / formulation of treatment plan, and once transitioned exclusively to oral pain medication.  In AIR the patient would be expected to tolerate minimum of 3 hrs of therapy daily, 5x/week.  -Discussed rehab options today with patient and patient's mother. The patient/family/caregiver was counseled and educated on the purpose of Acute Inpatient Rehabilitation and questions/concerns were addressed.     In order for the patient to be considered for admission to [facility] inpatient rehab, the case manager must give the patient / family choice in post-acute discharge options. If the patient desires [place], a referral from case management is required for  acceptance to [facility] inpatient rehab.  A referral for [facility] inpatient rehab has not been received.     Please page the consult pager from 8AM-5PM weekdays or the first call pager after hours/weekends/holidays with questions.  The patient was seen and examined with Dr. Tanda Rockers, MD, who is in agreement with the above stated assessment and plan.    Thank you for this consult    SUBJECTIVE     Reason for Consult: Patient seen in consultation at the request of Suzzanne Cloud, MD for evaluation of rehabilitation needs and recommendations.    History of Present Illness: [name] [name] is a 22 y.o. female currently hospitalized on 09/25/2022 for ileus and inability to tolerate PO intake.    Today  Patient states that she has been feeling poorly since fever started a few days ago.  States that pain has been worsening especially for her headaches and outside of line placement in neck.  Has not noted any improvement of dystonia with increase of baclofen.  He is currently pending MRI and further workup per neurology for evaluation of central process.  Has been able to tolerate working with therapy due to fatigue and pain.      Medical / Surgical History:   Past Medical History:   Diagnosis Date    Abdominal pain     Acid reflux     occas    Anesthesia complication     itching, shaking, coldness; last few surgeries have gone much better    Cancer (CMS-HCC)     Cataract of right eye     COVID-19 virus infection 01/2019    Cyst of thyroid determined by ultrasound     monitoring    [name]     2 right forearm, 1 left thigh, 1 right scapula, 1 under left clavicle; multiple    Difficult intravenous access     [name] (familial adenomatous polyposis)     [name]     Gastric polyps     History of chemotherapy     last treatment approx 05/2019    History of colon polyps     History of COVID-19 01/2019    Iron deficiency anemia due to chronic blood loss     received iron infusion 11-2019    PONV (postoperative nausea and vomiting)     Rectal bleeding     Syncopal episodes     especially if becoming dehydrated     Past Surgical History:   Procedure Laterality Date    COLON SURGERY      cyroablation      cystis removal      desmoid removal      PR CLOSE ENTEROSTOMY,RESEC+ANAST N/A 10/09/2020    Procedure: ILEOSTOMY TAKEDOWN;  Surgeon: Mickle Asper, MD;  Location: OR [place];  Service: General Surgery    PR COLONOSCOPY W/BIOPSY SINGLE/MULTIPLE N/A 10/27/2012    Procedure: COLONOSCOPY, FLEXIBLE, PROXIMAL TO SPLENIC FLEXURE; WITH BIOPSY, SINGLE OR MULTIPLE;  Surgeon: Shirlyn Goltz Mir, MD;  Location: PEDS PROCEDURE ROOM [place];  Service: Gastroenterology    PR COLONOSCOPY W/BIOPSY SINGLE/MULTIPLE N/A 09/14/2013    Procedure: COLONOSCOPY, FLEXIBLE, PROXIMAL TO SPLENIC FLEXURE; WITH BIOPSY, SINGLE OR MULTIPLE;  Surgeon: Shirlyn Goltz Mir, MD;  Location: PEDS PROCEDURE ROOM [place];  Service: Gastroenterology    PR COLONOSCOPY W/BIOPSY SINGLE/MULTIPLE N/A 11/08/2014    Procedure: COLONOSCOPY, FLEXIBLE, PROXIMAL TO SPLENIC FLEXURE; WITH BIOPSY, SINGLE OR MULTIPLE;  Surgeon: Arnold Long Mir, MD;  Location: PEDS PROCEDURE ROOM [place];  Service: Gastroenterology    PR COLONOSCOPY W/BIOPSY SINGLE/MULTIPLE N/A 12/26/2015    Procedure: COLONOSCOPY, FLEXIBLE, PROXIMAL TO SPLENIC FLEXURE; WITH BIOPSY, SINGLE OR MULTIPLE;  Surgeon: Arnold Long Mir, MD;  Location: PEDS PROCEDURE ROOM [facility];  Service: Gastroenterology    PR COLONOSCOPY W/BIOPSY SINGLE/MULTIPLE N/A 09/02/2017    Procedure: COLONOSCOPY, FLEXIBLE, PROXIMAL TO SPLENIC FLEXURE; WITH BIOPSY, SINGLE OR MULTIPLE;  Surgeon: Arnold Long Mir, MD;  Location: PEDS PROCEDURE ROOM [place];  Service: Gastroenterology    PR COLSC FLX W/REMOVAL LESION BY HOT BX FORCEPS N/A 08/27/2016    Procedure: COLONOSCOPY, FLEXIBLE, PROXIMAL TO SPLENIC FLEXURE; W/REMOVAL TUMOR/POLYP/OTHER LESION, HOT BX FORCEP/CAUTE;  Surgeon: Arnold Long Mir, MD;  Location: PEDS PROCEDURE ROOM [facility];  Service: Gastroenterology    PR COLSC FLX W/RMVL OF TUMOR POLYP LESION SNARE TQ N/A 02/25/2019    Procedure: COLONOSCOPY FLEX; W/REMOV TUMOR/LES BY SNARE;  Surgeon: Helyn Numbers, MD;  Location: GI PROCEDURES [place] [facility];  Service: Gastroenterology    PR COLSC FLX W/RMVL OF TUMOR POLYP LESION SNARE TQ N/A 03/13/2020    Procedure: COLONOSCOPY FLEX; W/REMOV TUMOR/LES BY SNARE;  Surgeon: Helyn Numbers, MD;  Location: GI PROCEDURES [place] [facility];  Service: Gastroenterology    PR EXC SKIN BENIG 2.1-3 CM TRUNK,ARM,LEG Right 02/25/2017    Procedure: EXCISION, BENIGN LESION INCLUDE MARGINS, EXCEPT SKIN TAG, LEGS; EXCISED DIAMETER 2.1 TO 3.0 CM;  Surgeon: Clarene Duke, MD;  Location: CHILDRENS OR [facility];  Service: Plastics    PR EXC SKIN BENIG 3.1-4 CM TRUNK,ARM,LEG Right 02/25/2017    Procedure: EXCISION, BENIGN LESION INCLUDE MARGINS, EXCEPT SKIN TAG, ARMS; EXCISED DIAMETER 3.1 TO 4.0 CM;  Surgeon: Clarene Duke, MD;  Location: CHILDRENS OR [facility];  Service: Plastics    PR EXC SKIN BENIG >4 CM FACE,FACIAL Right 02/25/2017    Procedure: EXCISION, OTHER BENIGN LES INCLUD MARGIN, FACE/EARS/EYELIDS/NOSE/LIPS/MUCOUS MEMBRANE; EXCISED DIAM >4.0 CM;  Surgeon: Clarene Duke, MD;  Location: CHILDRENS OR [facility];  Service: Plastics    PR EXC TUMOR SOFT TISSUE LEG/ANKLE SUBQ 3+CM Right 08/05/2019    Procedure: EXCISION, TUMOR, SOFT TISSUE OF LEG OR ANKLE AREA, SUBCUTANEOUS; 3 CM OR GREATER;  Surgeon: Arsenio Katz, MD;  Location: MAIN OR [place];  Service: Plastics    PR EXC TUMOR SOFT TISSUE LEG/ANKLE SUBQ <3CM Right 08/05/2019    Procedure: EXCISION, TUMOR, SOFT TISSUE OF LEG OR ANKLE AREA, SUBCUTANEOUS; LESS THAN 3 CM;  Surgeon: Arsenio Katz, MD;  Location: MAIN OR [facility];  Service: Plastics    PR LAP, SURG PROCTECTOMY W J-POUCH N/A 08/10/2020    Procedure: ROBOTIC ASSISTED LAPAROSCOPIC PROCTOCOLECTOMY, ILEAL J POUCH, WITH OSTOMY;  Surgeon: Mickle Asper, MD;  Location: OR [place];  Service: General Surgery    PR NDSC EVAL INTSTINAL POUCH DX W/COLLJ SPEC SPX N/A 01/23/2021    Procedure: ENDO EVAL SM INTEST POUCH; DX;  Surgeon: Modena Nunnery, MD;  Location: [facility] PROCEDURES [place] [facility];  Service: Gastroenterology    PR NDSC EVAL INTSTINAL POUCH DX W/COLLJ SPEC SPX N/A 08/27/2021    Procedure: ENDO EVAL SM INTEST POUCH; DX;  Surgeon: Hunt Oris, MD;  [facility];  Service: Gastroenterology    PR NDSC EVAL INTSTINAL POUCH DX W/COLLJ SPEC SPX N/A 12/09/2021    Procedure: ENDO EVAL SM INTEST POUCH; DX;  Surgeon: Vidal Schwalbe, MD;  Location: GI PROCEDURES [facility];  Service: Gastroenterology    PR NDSC EVAL INTSTINAL POUCH DX W/COLLJ [facility] SPX Left 04/09/2022    Procedure:  ENDO EVAL SM INTEST POUCH; DX;  Surgeon: Modena Nunnery, MD;  Location: [facility] PROCEDURES [place] [facility];  Service: Gastroenterology    PR NDSC EVAL INTSTINAL POUCH DX W/COLLJ SPEC SPX N/A 08/05/2022    Procedure: ENDO EVAL SM INTEST POUCH; DX;  Surgeon: Modena Nunnery, MD;  Location: GI PROCEDURES [facility];  Service: Gastroenterology    PR NDSC EVAL INTSTINAL POUCH W/BX SINGLE/MULTIPLE N/A 01/20/2022    Procedure: ENDOSCOPIC EVAL OF SMALL INTESTINAL POUCH; DIAGNOSTIC, No biopsies;  Surgeon: Andrey Farmer, MD;  Location: GI PROCEDURES [facility];  Service: Gastroenterology    PR NDSC EVAL INTSTINAL POUCH W/BX SINGLE/MULTIPLE N/A 02/13/2022    Procedure: ENDOSCOPIC EVAL OF SMALL INTESTINAL POUCH; DIAGNOSTIC, WITH BIOPSY;  Surgeon: Bronson Curb, MD;  Location: [facility] PROCEDURES [facility];  Service: Gastroenterology    PR UNLISTED PROCEDURE SMALL INTESTINE  01/23/2021    Procedure: UNLISTED PROCEDURE, SMALL INTESTINE;  Surgeon: Modena Nunnery, MD;  Location: [place] PROCEDURES [place] [facility];  Service: Gastroenterology    PR UNLISTED PROCEDURE SMALL INTESTINE  02/13/2022    Procedure: UNLISTED PROCEDURE, SMALL INTESTINE;  Surgeon: Bronson Curb, MD;  Location: GI PROCEDURES [facility];  Service: Gastroenterology    PR UPPER GI ENDOSCOPY,BIOPSY N/A 10/27/2012    Procedure: UGI ENDOSCOPY; WITH BIOPSY, SINGLE OR MULTIPLE;  Surgeon: Shirlyn Goltz Mir, MD;  Location: PEDS PROCEDURE ROOM [place] [place];  Service: Gastroenterology    PR UPPER GI ENDOSCOPY,BIOPSY N/A 09/14/2013    Procedure: UGI ENDOSCOPY; WITH BIOPSY, SINGLE OR MULTIPLE;  Surgeon: Shirlyn Goltz Mir, MD;  Location: PEDS PROCEDURE ROOM [facility];  Service: Gastroenterology    PR UPPER GI ENDOSCOPY,BIOPSY N/A 11/08/2014    Procedure: UGI ENDOSCOPY; WITH BIOPSY, SINGLE OR MULTIPLE;  Surgeon: Arnold Long Mir, MD;  Location: PEDS PROCEDURE ROOM [facility];  Service: Gastroenterology    PR UPPER GI ENDOSCOPY,BIOPSY N/A 12/26/2015    Procedure: UGI ENDOSCOPY; WITH BIOPSY, SINGLE OR MULTIPLE;  Surgeon: Arnold Long Mir, MD;  Location: PEDS PROCEDURE ROOM [facility];  Service: Gastroenterology    PR UPPER GI ENDOSCOPY,BIOPSY N/A 08/27/2016 Procedure: UGI ENDOSCOPY; WITH BIOPSY, SINGLE OR MULTIPLE;  Surgeon: Arnold Long Mir, MD;  Location: PEDS PROCEDURE ROOM [facility];  Service: Gastroenterology    PR UPPER GI ENDOSCOPY,BIOPSY N/A 09/02/2017    Procedure: UGI ENDOSCOPY; WITH BIOPSY, SINGLE OR MULTIPLE;  Surgeon: Arnold Long Mir, MD;  Location: PEDS PROCEDURE ROOM [facility];  Service: Gastroenterology    PR UPPER GI ENDOSCOPY,BIOPSY N/A 03/13/2020    Procedure: UGI ENDOSCOPY; WITH BIOPSY, SINGLE OR MULTIPLE;  Surgeon: Helyn Numbers, MD;  Location: GI PROCEDURES [place] [facility];  Service: Gastroenterology    PR UPPER GI ENDOSCOPY,BIOPSY N/A 09/05/2021    Procedure: UGI ENDOSCOPY; WITH BIOPSY, SINGLE OR MULTIPLE;  Surgeon: Wendall Papa, MD;  [facility]: GI PROC[facility]URES [facility];  Service: Gastroenterology    PR UPPER GI ENDOSCOPY,DIAGNOSIS N/A 01/20/2022    Procedure: UGI ENDO, INCLUDE ESOPHAGUS, STOMACH, & DUODENUM &/OR JEJUNUM; DX W/WO COLLECTION SPECIMN, BY BRUSH OR WASH;  Surgeon: Andrey Farmer, MD;  Location: GI PROCEDURES [facility];  Service: Gastroenterology    TUMOR REMOVAL      multiple-head, neck, back, hand, right flank, multiple        Social History:   Social History     Tobacco Use    Smoking status: Never     Passive exposure: Past    Smokeless tobacco: Never   Vaping Use    Vaping status: Never Used   Substance Use Topics  Alcohol use: Never    Drug use: Never       Living Environment: House  Lives With: Mother, Father  Home Living: Multi-level home, Bed/bath upstairs, 1/2 bath on main level, Stairs to alternate level with rails, Stairs to enter without rails, Stairs to alternate level without rails, Handicapped height toilet, Tub/shower unit, Able to Live on main level with bedroom/bathroom  Number of Stairs to Enter (outside): 2  Rail placement (inside): Rail on right side  Number of Stairs to Alternate level (inside): 14  Caregiver Identified?: Yes  Caregiver Availability: 24 hours  Caregiver Ability: Limited lifting    Family History: Reviewed  family history includes Alcohol abuse in her paternal grandfather; Arthritis in her maternal grandfather; Asthma in her maternal grandfather; COPD in her paternal grandmother; Cancer in her maternal grandmother; Diabetes in her maternal grandmother; Hypertension in her maternal grandmother; Miscarriages / [place] in her paternal grandmother; No Known Problems in her brother, father, maternal aunt, maternal uncle, mother, paternal aunt, paternal uncle, and sister; Other in her maternal grandmother; Stroke in her maternal grandmother; Thyroid disease in her maternal grandmother.    Allergies:   Adhesive tape-silicones; Ferrlecit [sodium ferric gluconat-sucrose]; Levofloxacin; Methylnaltrexone; Neomycin; Papaya; Morphine; Zosyn [piperacillin-tazobactam]; Compazine [prochlorperazine]; Iron analogues; Iron dextran; and Latex, natural rubber    Medications:   Scheduled    baclofen (LIORESAL) tablet 15 mg TID    bisacodyl (DULCOLAX) suppository 10 mg BID    buprenorphine-naloxone (SUBOXONE) 2-0.5 mg SL film 0.5 mg of buprenorphine TID    cefepime (MAXIPIME) 2 g in sodium chloride 0.9 % (NS) 100 mL IVPB-MBP Q8H    cetirizine (ZYRTEC) tablet 10 mg Daily    [Provider Hold] enoxaparin (LOVENOX) syringe 40 mg Q24H SCH    famotidine (PF) (PEPCID) injection 20 mg At bedtime    metroNIDAZOLE (FLAGYL) IVPB 500 mg Q8H    micafungin (MYCAMINE) 100 mg in sodium chloride 0.9 % (NS) 100 mL IVPB-MBP Q24H    mirtazapine (REMERON) tablet 15 mg Nightly    naloxegol (MOVANTIK) tablet 25 mg Daily    [Provider Hold] nirogacestat (OGSIVEO) tablet 50 mg *Patient Supplied* BID    pantoprazole (Protonix) injection 40 mg BID    polyethylene glycol (MIRALAX) packet 17 g BID    senna (SENOKOT) tablet 2 tablet Nightly    vancomycin (VANCOCIN) IVPB 1000 mg (premix) Q8H     PRN acetaminophen, 650 mg, Q6H PRN  aluminum-magnesium hydroxide-simethicone, 60 mL, Q6H PRN  calcium carbonate, 400 mg elem calcium, Daily PRN  Chemo Clarification Order, , Continuous PRN  fat emulsion 20 %, 100 mL, Once PRN  fat emulsion 20 %, 200 mL, Once PRN  guaiFENesin, 200 mg, Q4H PRN  HYDROmorphone, 1 mg, Q3H PRN  IP okay to treat, , Continuous PRN  LORazepam, 1 mg, Q8H PRN  LORazepam, 0.5 mg, TID PRN  lidocaine-diphenhydrAMINE-aluminum-magnesium, 10 mL, QID PRN  ondansetron, 4 mg, Q12H PRN  oxyCODONE, 10 mg, Q4H PRN  phenol, 2 spray, Q2H PRN  polyethylene glycol, 17 g, Daily PRN  promethazine, 6.5 mg, Q6H PRN  pseudoephedrine, 30 mg, Daily PRN  zinc oxide-cod liver oil, , Daily PRN      Continuous Infusions Chemo Clarification Order  IP okay to treat  lactated Ringers, Last Rate: 100 mL/hr (10/23/22 1215)  sodium chloride        OBJECTIVE     Vitals:  Temp:  [36.8 ??C (98.3 ??F)-38.8 ??C (101.8 ??F)] (P) 36.8 ??C (98.3 ??F)  Heart Rate:  [91-126] 106  SpO2  Pulse:  [91-126] 107  Resp:  [8-20] 11  BP: (106-129)/(48-66) 129/62  MAP (mmHg):  [67-80] 80  SpO2:  [98 %-100 %] 99 %  Vitals:    10/23/22 0500 10/23/22 0600 10/23/22 0800 10/23/22 1200   BP:   129/62    Pulse: 99 108 106    Resp: 10 13 11     Temp:   37.7 ??C (99.9 ??F) (P) 36.8 ??C (98.3 ??F)   TempSrc:   Oral (P) Oral   SpO2: 100% 100% 99%    Weight:       Height:           Patient Lines/Drains/Airways Status       Active Peripheral & Central Intravenous Access       Name Placement date Placement time Site Days    CVC Triple Lumen 10/22/22 Non-tunneled Right Internal jugular 10/22/22  1521  Internal jugular  less than 1                  Patient Lines/Drains/Airways Status       Active Wounds       None                    Physical Exam:    GEN: Lying in bed in NAD.  HEENT: Normocephalic. Moist mucous membranes. NGT in place. Clamped   RESP: NWOB on RA.  CV: Warm and well-perfused.  GI: non distended   GU: deferred  SKIN: without lesions, bruises, ecchymosis, rashes on clothed examination  MSK: TTP to bilateral knees (R>L) with passive/active range of motion   NEURO:  Mental Status: A&Ox4, attention intact, speech fluid and coherent, follows commands well and answers questions appropriately  Sensory: Sensory is intact to light touch with exception of left ulnar distribution involving forearm & 4/5th digits  Motor:      Delt Bi Tri WE HG  RUE 5 5 5 5 4     LUE 5 5 5 5 4                  HF KE       DF       PF        RLE     1 1 1 1               LLE   1 1 1 1       Lower extremity exam limited by pain    Abbreviations:   Delt= Deltoid, Bi= Biceps, Tri= Triceps, WF= Wrist flexor, WE= Wrist extensor, HG= Hand Grip  HF= Hip flexor, KE= Knee Extensor, DF= Dorsiflexor, PF= Plantar Flexor, EHL= Extensor Hallicus Longus    Tone: increased tone most prominent to bilateral lower extremities with hip flexion, knee flexor & knee extension, tone is variable, there is increasd tone to bilateral gastroc-soleus complex  Reflexes: no clonus    Labs and Diagnostic Studies: Reviewed     Radiology Results: Reviewed   Reviewed MRI CTL    IR Remove Tunneled Catheter    Result Date: 10/23/2022  PROCEDURE: Tunneled central venous catheter removal ACCESSION: [id number] UN     Procedural Personnel Attending physician(s): Dr. Claretta Fraise Resident physician(s): Dr. Leone Payor Advanced practice provider(s): None     Pre-procedure diagnosis: Bacteremia Post-procedure diagnosis: Same Indication: Bacteremia or sepsis of unknown source Additional clinical history: None _______________________________________________________________     PROCEDURE SUMMARY: - Tunneled central venous catheter removal - Additional procedure(s): None     PROCEDURE DETAILS:  Pre-procedure Consent: Informed consent for the procedure including risks, benefits and alternatives was obtained and time-out was performed prior to the procedure. Preparation: The site was prepared and draped using maximal sterile barrier technique including cutaneous antisepsis.     Anesthesia/sedation Level of anesthesia/sedation: No sedation Anesthesia/sedation administered by: Not applicable     Catheter removal Local anesthesia was administered. The catheter was removed with a combination of traction and blunt dissection. Fluoroscopic images were not obtained to document removal.     Closure Hemostasis was achieved with manual compression. Sterile dressing(s) applied.     Contrast Contrast agent: Omnipaque 300 Contrast volume (mL): 0     Radiation Dose Fluoroscopy time (minutes): 0  Reference Air Kerma (mGy): 0     Additional Details Additional description of procedure: None Registry event: Z/3/f Device used: None Equipment details: None Specimens removed: Tunneled central venous catheter. Estimated blood loss (mL): Less than 10 Standardized report: [email or web address]     Attestation Signer name: Claretta Fraise I attest that I supervised the procedure and was immediately available. I reviewed the stored images and agree with the report as written. _______________________________________________________________     Complications: No immediate complications.             Removal of right-sided tunneled powerline catheter in the internal jugular vein.     Plan:     Please re-consult interventional radiology if new catheter placement is desired.         XR Chest Portable    Result Date: 10/22/2022  EXAM: XR CHEST PORTABLE ACCESSION: [id number] UN     CLINICAL INDICATION: CATHETER VASCULAR FIT&ADJ      TECHNIQUE: Single View AP Chest Radiograph.     COMPARISON: 10/13/2022     FINDINGS:     Right internal jugular venous catheter with tip terminating at the superior cavoatrial junction. Enteric tube enters the stomach.     Lungs are clear.  No pleural effusion or pneumothorax.     Normal heart size and mediastinal contours.             New right internal jugular venous catheter with tip terminating at the superior cavoatrial junction    ECG 12 Lead    Result Date: 10/22/2022  SINUS TACHYCARDIA CANNOT RULE OUT ANTERIOR INFARCT  , AGE UNDETERMINED ABNORMAL ECG WHEN COMPARED WITH ECG OF 19-Oct-2022 11:22, BASELINE ARTIFACT ON PREVIOUS ECG PREVENTS ACCURATE COMPARISON HEART RATE HAS INCREASED Confirmed by Warnell Forester ([id number]) on 10/22/2022 12:10:43 PM    CT Abdomen Pelvis W Contrast    Result Date: 10/21/2022  EXAM: CT ABDOMEN PELVIS W CONTRAST ACCESSION: [id number] UN CLINICAL INDICATION: 22 years old with Abdominal pain, fevers; complicated PMHx, hx obstruction/partial obstruction, desmoid tumors      COMPARISON: CT abdomen pelvis 09/27/2022     TECHNIQUE: A helical CT scan of the abdomen and pelvis was obtained following IV contrast from the lung bases through the pubic symphysis. Images were reconstructed in the axial plane. Coronal and sagittal reformatted images were also provided for further evaluation.         FINDINGS:     LOWER CHEST: No focal consolidation, pleural effusion, or pneumothorax.     LIVER: Normal liver contour. Subcentimeter hypoattenuating hepatic lesions, too small to characterize on CT.     BILIARY: Layering sludge versus stones without gallbladder wall thickening. There is a small amount of pericholecystic fluid/right upper quadrant fluid which is similar to prior. No biliary ductal dilatation.      SPLEEN: Normal in size  and contour.     PANCREAS: Normal pancreatic contour.  No focal lesions.  No ductal dilation.     ADRENAL GLANDS: Normal appearance of the adrenal glands.     KIDNEYS/URETERS: Symmetric renal enhancement.  No hydronephrosis. Unchanged size of 1.1 cm intermediate attenuation left renal lesion (2:46).     BLADDER: Mild circumferential bladder wall thickening which may be exaggerated underdistention.     REPRODUCTIVE ORGANS: Anteverted uterus. 3.7 cm left adnexal low attenuation lesion, consistent with an ovarian follicle/cyst, too small to require follow-up.     GI TRACT: Esophogastric tube tip in the distal stomach. Proctocolectomy with ileoanal anastomosis. There is oral contrast within the stomach, and mid and distal small bowel to the level of the anastomosis. Mild dilation of small bowel measuring up to 3.7 cm in diameter. There are regions of bowel tethering and multifocal narrowing associated with mesenteric mass described below. No discrete single transition point. Additionally, there is persistent dilation of distal ileum proximal to the ileoanal anastomosis (4:70). No bowel wall thickening.     PERITONEUM, RETROPERITONEUM AND MESENTERY: No free air.  No ascites.  No fluid collection. Similar size of ill-defined mesenteric mass centered in the lower central mesentery measuring approximately 2.8 x 2.5 cm (2:94) with associated tethering of bowel.     LYMPH NODES: No adenopathy by size criteria. Similar multiple mildly prominent mesenteric nodes measuring up to 0.6 cm short axis.     VESSELS: Hepatic and portal veins are patent.  Normal caliber aorta.      BONES and SOFT TISSUES: No aggressive osseous lesions.  No focal soft tissue lesions.             -Proctocolectomy with ileoanal anastomosis. Mildly dilated small bowel loops with intervening loops of tethered and narrowed small bowel associated with the known desmoid tumor. Contrast visualized in the distal ileum, ruling out complete obstruction.     -Dilation of distal ileum just proximal to the ileoanal anastomosis, similar to multiple priors, nonspecific. Recommend correlation for anastomotic stricture.     -Circumferential bladder wall thickening which may be exaggerated by underdistention. Recommend correlation with clinical symptoms and/or urinalysis for acute infection.         XR Abdomen Portable    Result Date: 10/20/2022  EXAM: XR ABDOMEN PORTABLE ACCESSION: [id number] UN     CLINICAL INDICATION: 22 years old with ABDOMINAL PAIN  -  UNSPECIFIED SITE      COMPARISON: 10/06/2022 abdominal radiograph     TECHNIQUE: Supine view of the abdomen, 2 image(s)     FINDINGS: The lung bases appear unremarkable. An esophagogastric tube with tip and sidehole overlying the stomach is identified. The tip overlies the distal stomach. Unchanged mild to moderately dilated loops of gas containing small bowel identified overlying the lower abdomen and upper pelvis similar to priors. Sequela of J-pouch creation in the pelvis. Otherwise the remaining small bowel loops are nondilated. Prominently distended bladder is identified. No evidence of acute osseous abnormalities.         An esophagogastric tube with tip and sidehole overlying the stomach is identified. The tip overlies the distal stomach. Unchanged mild to moderately dilated loops of gas containing small bowel are identified overlying the lower abdomen and upper pelvis similar to priors. This finding is not specific and could also be associated with postsurgical changes and/or paralytic ileus. Correlation with clinical findings recommended. Sequela of J-pouch creation in the pelvis. Otherwise the remaining small bowel loops are nondilated. Prominently distended bladder is identified.  ECG 12 Lead    Result Date: 10/19/2022  POOR DATA QUALITY POOR DATA QUALITY IN CURRENT ECG PRECLUDES SERIAL COMPARISON Confirmed by Christella Noa [phone]) on 10/19/2022 4:39:47 PM    Echocardiogram Follow Up/Limited Echo    Result Date: 10/17/2022  Patient Info Name:     [name] [name] Age:     21 years DOB:     11/03/2000 Gender:     Female MRN:     [id number] Accession #:     [id number] UN Account #:     [id number] Ht:     165 cm Wt:     60 kg BSA:     1.66 m2 BP:     124 /     57 mmHg HR:     93 bpm Exam Date:     10/17/2022 8:30 AM Admit Date:     09/25/2022     Exam Type:     ECHOCARDIOGRAM FOLLOW UP/LIMITED ECHO     Technical Quality:     Fair     Staff Sonographer:     Margarita Grizzle Referring Physician:     Worthy Flank Reading Fellow:     Damita Dunnings MD     Study Info Indications      - Recurrent syncope Procedure(s)   Limited 2D, color flow and Doppler transthoracic echocardiogram is performed.         Summary   1. The left ventricle is normal in size with normal wall thickness.   2. The left ventricular systolic function is normal, LVEF is visually estimated at > 55%.   3. The right ventricle is not well visualized but probably normal in size, with probably normal systolic function.   4. There are no significant valvular abnormalities.         Left Ventricle   The left ventricle is normal in size with normal wall thickness. The left ventricular systolic function is normal, LVEF is visually estimated at > 55%. There is normal left ventricular diastolic function.     Right Ventricle   The right ventricle is not well visualized but probably normal in size, with probably normal systolic function.         Left Atrium   The left atrium is normal in size.         Aortic Valve   The aortic valve is poorly visualized with probably normal excursion with normal appearing leaflets with normal excursion. There is no significant aortic regurgitation.     Mitral Valve   The mitral valve leaflets are normal with normal leaflet mobility. There is no significant mitral valve regurgitation.     Tricuspid Valve   The tricuspid valve leaflets are normal, with normal leaflet mobility. There is trivial tricuspid regurgitation.         Aorta   The aorta is normal in size in the visualized segments.     Pericardium/Pleural   There is a trivial pericardial effusion.         Ventricles ---------------------------------------------------------------------- Name                                 Value        Normal ----------------------------------------------------------------------     LV Dimensions 2D/MM ----------------------------------------------------------------------  IVS Diastolic Thickness (2D)  0.7 cm       0.6-0.9 LVID Diastole (2D)                  4.2 cm       3.8-5.2  LVPW Diastolic Thickness (2D)                                0.7 cm       0.6-0.9 LVID Systole (2D)                   2.8 cm       2.2-3.5 LV Mass Index (2D Cubed) 51 g/m2         43-95  Relative Wall Thickness (2D)                                  0.33     Atria ---------------------------------------------------------------------- Name                                 Value        Normal ----------------------------------------------------------------------     LA Dimensions ---------------------------------------------------------------------- LA Dimension (2D)                   2.2 cm       2.7-3.8     Mitral Valve ---------------------------------------------------------------------- Name                                 Value        Normal ----------------------------------------------------------------------     MV Diastolic Function ---------------------------------------------------------------------- MV E Peak Velocity                 87 cm/s               MV A Peak Velocity                 87 cm/s               MV E/A                                 1.0                   MV Annular TDI ---------------------------------------------------------------------- MV Septal e' Velocity             7.8 cm/s         >=8.0 MV E/e' (Septal)                      11.2               MV Lateral e' Velocity           14.6 cm/s        >=10.0 MV E/e' (Lateral)                      6.0               MV e' Average                    11.2 cm/s  MV E/e' (Average)                      8.6     Tricuspid Valve ---------------------------------------------------------------------- Name                                 Value        Normal ----------------------------------------------------------------------     TV Regurgitation Doppler ---------------------------------------------------------------------- TR Peak Velocity                   2.1 m/s     Aorta ---------------------------------------------------------------------- Name                                 Value        Normal ----------------------------------------------------------------------     Ascending Aorta ---------------------------------------------------------------------- Ao Root Diameter (2D)               2.6 cm               Ao Root Diam Index (2D)          1.6 cm/m2         Report Signatures Finalized by Madaline Savage  MD on 10/17/2022 12:16 PM Resident Damita Dunnings  MD on 10/17/2022 09:29 AM

## 2022-10-23 NOTE — Unmapped (Signed)
Division of Infectious Diseases  General Inpatient Consultation Service     For questions about this consult, page [phone] (Gen A Follow-up Pager).      [name] [name] is being seen in consultation at the request of [name] Cloud, MD for evaluation and management of GNR bacteremia.       PLAN FOR 10/23/2022    Diagnostic  FU ID and Sensitivities of GNR and yeasts growing in blood cultures sent on 10/21/2022.  FU urine Cx sent 10/23/2022.  Will need an Echo and Ophthalmology review for yeast infection in blood.  Noted liver ultrasound report unremarkable.  Monitor for antimicrobial toxicity with the following:  CBC w/diff at least once per week  BMP at least once per week  clinical assessments for rashes or other skin changes  vancomycin trough levels (goal 10-15; at least weekly once stable)    Treatment  Continue on IV Cefepime and Metronidazole.  Continue on IV Micafungin.  STOP Vancomycin.  Duration of therapy = TBD  start date = 10/21/22  end date = TBD    I discussed the plans for today with primary team, family/caregiver(s), and patient on 10/23/2022.    Our service will continue to follow.    I personally spent 45 mins face-to-face and non-face-to-face in the care of this patient on 10/23/2022, which includes all pre, intra, and post visit time on the date of service.  All documented time was specific to the E/M visit and does not include any procedures that may have been performed.      [name] [name], MBBS  [place] [facility] of Infectious Diseases                  MDM and Problem-Specific Assessments  ( .00ID2DAY  /  .09WJXBJYNWGN  /  .IDSS  / .[name] )     22 y.o. 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 S/p R IJ removal on 8/14.    October 23, 2022   Patient has: []  acute illness w/systemic sxs  [mod] [x]  illness posing risk to life or function  [high]   I reviewed:   (3+) [x]  primary team note [x]  consultant note(s) []  procedure/op note(s) [x]  micro result(s)    []  CBC results []  chemistry results []  radiology report(s) []  nursing note(s)   I independently visualized:   (any)   []  cxs/plates in lab []  plain film images []  CT images []  PET images    []  path slide(s) []  ECG tracing []  MRI images []  nuclear scan   I discussed: (any) []  micro and/or path w/lab personnel []  drug options and/or interactions w/ID pharmD    []  procedure/OR findings w/other MD(s) []  echo and/or imaging w/other MD(s)    []  mgm't w/attending(s) involved in case []  setting up home abx w/OPAT team   Mgm't requires: []  prescription drug(s)  [mod] [x]  intensive toxicity monitoring  [high]     Interval notes & result reports show: Afebrile. VSS. Previous PICC line removed yesterday.     Patient reports: Has pain in head, neck, stomach, and a severe headache. New PICC line in neck took 5 attempts, causing lots of neck pain.     My interpretation of the data I reviewed is: No new CBC or labs. No new imaging. 8/13 Bcx 1/2  GNR Lactose Fermenter. 8/13 Bcx 2/2 NGTD.    # GNR bacteremia  -    ACUTE = LESS THAN 1 YEAR DURATION  # Yeast in blood Cx  Patient admitted since 7/18 with course c/b worsening RLQ abdominal pain in setting of new recurrent fevers and tachycardia. Of note, R IJ for TPN present since 01/2022. Blood cultures on 8/13 grew 1/2 GNR.Discussed with Microbiology likely a lactose fermenter.    Management is as per the blue box above.    # Background of Gardner's syndrome, desmoid tumors-  ACUTE = LESS THAN 1 YEAR DURATION  Diagnosed around the age of 1.  [name] [name] currently on hold.    # Circumferential bladder wall thickening on CT from 10/22/2022 -    ACUTE = LESS THAN 1 YEAR DURATION  Ua from 10/22/2022 positive.    Management is as per the blue box above.    # Muscle rigidity, increased tone and spasms   -    ACUTE = LESS THAN 1 YEAR DURATION  09/25/2022 had injection of desmoid tumors with steroid/anesthetic under US guidance. For desmoid tumors in right forearm and left chest wall. Had multiple injections like these in the past without complications.  Admitted on 7.19 for observations , her workup included MRI spine and CK which were unremarkable.  Management is as per the blue box above.    # Deranged LFT -  ACUTE = LESS THAN 1 YEAR DURATION  ALT 176 from 10/22/2022 now improving.    Management is as per the blue box above.      # Management of prescription antimicrobials needing intensive toxicity monitoring - acute, poses threat to life or bodily function  [high]  Beta-lactams (penicillins, cephalosporins, and carbapenems) can cause rashes, loose stools, nephrotoxicity, and/or myelosuppression.  Cefepime can cause various types of neurotoxicity, including depressed consciousness, encephalopathy, aphasia, myoclonus, seizures, and coma. Metronidazole can cause rashes, dysgeusia (altered taste), peripheral neuropathy, and/or myelosuppression.  Vancomycin can cause back pain, various dermatological effects, thrombocytopenia, neutropenia, and/or nephrotoxicity.   See recommendations in blue box above.      # Disposition  SUCCINCTLY explain what you anticipate dispo might be (e.g., OPAT candidacy).   TBD            Antimicrobials & Other Medications  ( .00IDGANTT  /  .00IDGANTTLIST  )     Current  Cefepime 8/13-  Metronidazole 8/13-  Vancomycin 8/13-  Micafungin 8/14-    Previous  None     Immunomodulators and antipyretics  None       Current Medications as of 10/23/2022  Scheduled  PRN   baclofen, 15 mg, TID  bisacodyl, 10 mg, BID  buprenorphine-naloxone, 0.25 Film, TID  cefepime, 2 g, Q8H  cetirizine, 10 mg, Daily  [Provider Hold] enoxaparin (LOVENOX) injection, 40 mg, Q24H SCH  famotidine (PEPCID) IV, 20 mg, At bedtime  metroNIDAZOLE, 500 mg, Q8H  micafungin, 100 mg, Q24H  mirtazapine, 15 mg, Nightly  naloxegol, 25 mg, Daily  [Provider Hold] [name] [name], 50 mg, BID  pantoprazole (Protonix) intravenous solution, 40 mg, BID  polyethylene glycol, 17 g, BID  senna, 2 tablet, Nightly  vancomycin, 1,000 mg, Q8H      acetaminophen, 650 mg, Q6H PRN  aluminum-magnesium hydroxide-simethicone, 60 mL, Q6H PRN  calcium carbonate, 400 mg elem calcium, Daily PRN  Chemo Clarification Order, , Continuous PRN  fat emulsion 20 %, 100 mL, Once PRN  fat emulsion 20 %, 200 mL, Once PRN  guaiFENesin, 200 mg, Q4H PRN  HYDROmorphone, 1 mg, Q3H PRN  IP okay to treat, , Continuous PRN  LORazepam, 1 mg, Q8H PRN  LORazepam, 0.5 mg, TID PRN  ondansetron, 4 mg, Q12H PRN  oxyCODONE, 10 mg, Q4H PRN  phenol, 2 spray, Q2H PRN  polyethylene glycol, 17 g, Daily PRN  promethazine, 6.5 mg, Q6H PRN  zinc oxide-cod liver oil, , Daily PRN           Physical Exam     Temp:  [36.8 ??C (98.3 ??F)-38.8 ??C (101.8 ??F)] 37.7 ??C (99.9 ??F)  Heart Rate:  [91-126] 108  SpO2 Pulse:  [91-126] 110  Resp:  [8-20] 13  BP: (104-123)/(48-66) 112/53  MAP (mmHg):  [67-80] 70  SpO2:  [95 %-100 %] 100 %    Actual body weight: 60.2 kg (132 lb 11.5 oz)  Ideal body weight: 57 kg (125 lb 10.6 oz)  Adjusted ideal body weight: 58.3 kg (128 lb 7.7 oz)      Const [x]  vital signs above      []  WDWN, NAD, non-toxic appearance  [x]  Chronically ill appearing, oral thrush photo taken and uploaded onto EPIC media.      Eyes   [x]  Lids normal bilaterally, conjunctiva anicteric and noninjected OU  [x]  PERRL   []        ENMT     [x]  Normal appearance of external nose and ears       [x]  OP notable for white patch on tongue, no other visible lesions    [x]  MMM, no lesions on lips or gums, dentition good        [x]  Hearing normal   [x]  NG tube in place.      Neck    [x]  Neck of normal appearance and trachea midline        [x]  No thyromegaly, nodules, or tenderness   [x]  New central line inserted on the right.      Lymph    [x]  No LAD in neck       []  No LAD in supraclavicular area       []  No LAD in axillae   []  No LAD in epitrochlear chains       []  No LAD in inguinal areas  []        CV    [x]  RRR, no m/r/g, S1/S2       [x]  No peripheral edema, WWP       [x]  Pedal pulses intact   []        Resp   [x]  Normal WOB       [x]  CTAB   []        GI   []  Normal inspection, NTND, NABS       []  No umbilical hernia on exam       []  No hepatosplenomegaly       []  Inspection of perineal and perianal areas normal  [x]  Scar in the RLQ, no obvious peristalsis shown on inspection, Abdomen tender on superficial palpation,has increased bowel sounds.      GU   []  Normal external genitalia       [x]  Deferred.      MSK   []  No clubbing or cyanosis of hands       []  No focal tenderness or abnormalities on palpation of joints in RUE, LUE, RLE, or LLE  [x]  Desmoid tumour on right arm and left side of chest.      Skin   [x]  No rashes, lesions, or ulcers of visualized  skin       [x]  Skin warm and dry to palpation   []        Neuro   [x]  CNs II-XII grossly intact       [x]  Sensation to light touch grossly intact throughout   [x]  DTRs normal and symmetric throughout   []  Unable to assess due to critical illness, sedation, or mental status  []        Psych   [x]  Appropriate affect      [x]  Oriented to person, place, time      [x]  Judgment and insight are appropriate   []  Unable to assess due to critical illness, sedation, or mental status  []           Patient Lines/Drains/Airways Status       Active Active Lines, Drains, & Airways       Name Placement date Placement time Site Days    CVC Triple Lumen 10/22/22 Non-tunneled Right Internal jugular 10/22/22  1521  Internal jugular  less than 1    NG/OG Tube Decompression Right nostril 09/26/22  1000  Right nostril  26                      Data for ID Decision Making  ( IDGENCONMDM )       Micro & Serological Data   ( RSLTMICRO  /  [id number]  /  00CXSRC  /  00CXRES  /  00CXSUSC )    Microbiology Results (last day)       Procedure Component Value Date/Time Date/Time    Blood Culture #2 [[id number]]  (Normal) Collected: 10/21/22 0714    Lab Status: Preliminary result Specimen: Blood from 1 Peripheral Draw Updated: 10/23/22 0745     Blood Culture, Routine No Growth at 48 hours    Blood Culture #1 [[id number]]  (Abnormal) Collected: 10/21/22 0715    Lab Status: Preliminary result Specimen: Blood from 1 Peripheral Draw Updated: 10/23/22 0031     Blood Culture, Routine Gram Negative Rod, Lactose Fermenter     Comment: Identification and Susceptibility to Follow        Gram Stain Result Gram negative rods (bacilli)      Yeast    Blood Culture #1 [[id number]]  (Normal) Collected: 10/21/22 1814    Lab Status: Preliminary result Specimen: Blood from 1 Peripheral Draw Updated: 10/22/22 1845     Blood Culture, Routine No Growth at 24 hours    Blood Culture #2 [[id number]]  (Normal) Collected: 10/21/22 1815    Lab Status: Preliminary result Specimen: Blood from 1 Peripheral Draw Updated: 10/22/22 1845     Blood Culture, Routine No Growth at 24 hours    Catheter Tip Culture [[id number]]     Lab Status: No result Specimen: Catheter tip, IV from TPN              7/19 Bcx NGTD  8/13 Bcx 1/2 GNR Lactose Fermenter  8/13 Bcx 2/2 NGTD    Recent Studies  ( RISRSLT )    XR Chest Portable    Result Date: 10/22/2022  EXAM: XR CHEST PORTABLE ACCESSION: [id number] UN     CLINICAL INDICATION: CATHETER VASCULAR FIT&ADJ      TECHNIQUE: Single View AP Chest Radiograph.     COMPARISON: 10/13/2022     FINDINGS:     Right internal jugular venous catheter with tip terminating at the superior cavoatrial junction. Enteric tube enters the stomach.     Lungs are clear.  No pleural effusion or pneumothorax.     Normal heart size and mediastinal contours.             New right internal jugular venous catheter with tip terminating at the superior cavoatrial junction    ECG 12 Lead    Result Date: 10/22/2022  SINUS TACHYCARDIA CANNOT RULE OUT ANTERIOR INFARCT  , AGE UNDETERMINED ABNORMAL ECG WHEN COMPARED WITH ECG OF 19-Oct-2022 11:22, BASELINE ARTIFACT ON PREVIOUS ECG PREVENTS ACCURATE COMPARISON HEART RATE HAS INCREASED Confirmed by Warnell Forester ([id number]) on 10/22/2022 12:10:43 PM    CT Abdomen Pelvis W Contrast    Result Date: 10/21/2022  EXAM: CT ABDOMEN PELVIS W CONTRAST ACCESSION: [id number] UN CLINICAL INDICATION: 22 years old with Abdominal pain, fevers; complicated PMHx, hx obstruction/partial obstruction, desmoid tumors      COMPARISON: CT abdomen pelvis 09/27/2022     TECHNIQUE: A helical CT scan of the abdomen and pelvis was obtained following IV contrast from the lung bases through the pubic symphysis. Images were reconstructed in the axial plane. Coronal and sagittal reformatted images were also provided for further evaluation.         FINDINGS:     LOWER CHEST: No focal consolidation, pleural effusion, or pneumothorax.     LIVER: Normal liver contour. Subcentimeter hypoattenuating hepatic lesions, too small to characterize on CT.     BILIARY: Layering sludge versus stones without gallbladder wall thickening. There is a small amount of pericholecystic fluid/right upper quadrant fluid which is similar to prior. No biliary ductal dilatation.      SPLEEN: Normal in size and contour.     PANCREAS: Normal pancreatic contour.  No focal lesions.  No ductal dilation.     ADRENAL GLANDS: Normal appearance of the adrenal glands.     KIDNEYS/URETERS: Symmetric renal enhancement.  No hydronephrosis. Unchanged size of 1.1 cm intermediate attenuation left renal lesion (2:46).     BLADDER: Mild circumferential bladder wall thickening which may be exaggerated underdistention.     REPRODUCTIVE ORGANS: Anteverted uterus. 3.7 cm left adnexal low attenuation lesion, consistent with an ovarian follicle/cyst, too small to require follow-up.     GI TRACT: Esophogastric tube tip in the distal stomach. Proctocolectomy with ileoanal anastomosis. There is oral contrast within the stomach, and mid and distal small bowel to the level of the anastomosis. Mild dilation of small bowel measuring up to 3.7 cm in diameter. There are regions of bowel tethering and multifocal narrowing associated with mesenteric mass described below. No discrete single transition point. Additionally, there is persistent dilation of distal ileum proximal to the ileoanal anastomosis (4:70). No bowel wall thickening.     PERITONEUM, RETROPERITONEUM AND MESENTERY: No free air.  No ascites.  No fluid collection. Similar size of ill-defined mesenteric mass centered in the lower central mesentery measuring approximately 2.8 x 2.5 cm (2:94) with associated tethering of bowel.     LYMPH NODES: No adenopathy by size criteria. Similar multiple mildly prominent mesenteric nodes measuring up to 0.6 cm short axis.     VESSELS: Hepatic and portal veins are patent.  Normal caliber aorta.      BONES and SOFT TISSUES: No aggressive osseous lesions.  No focal soft tissue lesions.             -Proctocolectomy with ileoanal anastomosis. Mildly dilated small bowel loops with intervening loops of tethered and narrowed small bowel associated with the known desmoid tumor. Contrast visualized in the distal ileum, ruling out complete obstruction.     -Dilation  of distal ileum just proximal to the ileoanal anastomosis, similar to multiple priors, nonspecific. Recommend correlation for anastomotic stricture.     -Circumferential bladder wall thickening which may be exaggerated by underdistention. Recommend correlation with clinical symptoms and/or urinalysis for acute infection.                Initial Consult Documentation from October 22, 2022     Sources of information include: chart review, patient, and family/friend(s).    History of Present Illness:     22 y.o. 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    Patient has been admitted to [facility] since 7/18 with chronic abdominal pain and restarted on TPN in the setting of low PO intake. Patient was noted with chronic abdominal pain and nausea that is secondary to desmoid tumors and prior surgeries. More recently on 8/11, patient was noted with peristalsis in right lower quadrant. She was last noted with bowel movement on 8/11. Since, she has had worsening abdominal pain. Overnight of 8/12, patient was febrile to 38.7 despite tylenol and tachycardic to 140s. Since, patient has been having recurrent fevers and tachycardic with no leukocytosis. CTAP (8/13) was unremarkable for acute processes and demonstrated no small bowel obstruction. Blood cultures on 8/13 grew 1/2 GNR. Patient was started on cefepime, vancomycin, and metronidazole on 8/13. Right IJ was removed on 8/14.    Of note, pouchoscopy on 02/2022 showed 1cm of circumferential ulceration with severe, active pouchitis which improved ~2 months later. Repeat pouchoscopy on 07/16/22 demonstrated small ulcerations on suture line and a rectal cuff beginning 1cm from anal verge and extending 2cm with healthy appearing mucosa. Patient has been on TPN since 09/28/22 through nontunneled right IJ that has been present since 01/2022.    Past Medical History   Patient  has a past medical history of Abdominal pain, Acid reflux, Anesthesia complication, Cancer (CMS-HCC), Cataract of right eye, COVID-19 virus infection (01/2019), Cyst of thyroid determined by ultrasound, [name], Difficult intravenous access, [name] (familial adenomatous polyposis), [name], Gastric polyps, History of chemotherapy, History of colon polyps, History of COVID-19 (01/2019), Iron deficiency anemia due to chronic blood loss, PONV (postoperative nausea and vomiting), Rectal bleeding, and Syncopal episodes.      Meds and Allergies  Patient has a current medication list which includes the following prescription(s): lorazepam, promethazine, acetaminophen, baclofen, cefdinir, cholecalciferol (vitamin d3 25 mcg (1,000 units)), clobetasol, diclofenac sodium, doxycycline, famotidine, hydrocortisone, lidocaine, lorazepam, mirtazapine, naloxone, [name] [name], oxycodone, pimecrolimus, triamcinolone, and zinc oxide, and the following Facility-Administered Medications: acetaminophen, aluminum-magnesium hydroxide-simethicone, baclofen, bisacodyl, buprenorphine-naloxone, calcium carbonate, cefepime (MAXIPIME) 2 g in sodium chloride 0.9 % (NS) 100 mL IVPB-MBP, cetirizine, CHEMO CLARIFICATION ORDER, [Provider Hold] enoxaparin, famotidine (pf), fat emulsion 20 %, fat emulsion 20 %, guaifenesin, hydromorphone (pf), IP OKAY TO TREAT, lactated ringers, lorazepam, lorazepam, metronidazole, micafungin (MYCAMINE) 100 mg in sodium chloride 0.9 % (NS) 100 mL IVPB-MBP, mirtazapine, naloxegol, [Provider Hold] [name] [name], ondansetron, oxycodone, pantoprazole, phenol, polyethylene glycol, polyethylene glycol, promethazine (PHENERGAN) 6.5 mg in sodium chloride (NS) 0.9 % 50 mL IVPB, senna, sodium chloride, vancomycin, and zinc oxide-cod liver oil.    Allergies: Adhesive tape-silicones; Ferrlecit [sodium ferric gluconat-sucrose]; Levofloxacin; Methylnaltrexone; Neomycin; Papaya; Morphine; Zosyn [piperacillin-tazobactam]; Compazine [prochlorperazine]; Iron analogues; Iron dextran; and Latex, natural rubber       Social History  Patient  reports  that she has never smoked. She has been exposed to tobacco smoke. She has never used smokeless tobacco. She reports that she does not drink alcohol and does not use drugs.          Scribe's Attest:  [name] Rud, MD obtained and performed the history, physical exam and medical decision making elements that were entered into the chart. Documentation assistance was provided by me personally. Signed by Lanelle Bal, Scribe, on October 23, 2022 at 8:17 AM.     Provider???s Attestation:   Documentation assistance provided by the Scribe, Lanelle Bal. I was present during the time the encounter was [name] Rud, MD. October 23, 2022 at 8:17 AM

## 2022-10-23 NOTE — Unmapped (Signed)
Internal Medicine Consult Service Progress Note    Assessment & Recommendations:   [name] [name] is a 22 y.o. female with a PMHx of [name] s/p proctocolectomy, cutaneous desmoid tumors that presented to [facility] with anxiety and nausea with ileus and inability to tolerate PO. Pt was seen at the request of Suzzanne Cloud, MD ([facility])) for improved IV access and CVC placement.    Triple lumen CVC placed in RIJ on 10/22/22, initially attempted LIJ cannulation but unsuccessful. Patient notes pain in bilateral neck and sensation of swelling, however no swelling, bruising or bleeding appreciated on exam. Advised patient to continue neck movement to avoid stiffening of musculature. Per nursing, all lumens flushing appropriately with no concerns. We will sign off at this time, please do not hesitate to reach out if further needs arise.     For questions between 7:30AM-5PM, please page the Medicine Consult Service pager at [phone]. After 5PM, the Medicine Consult Service is covered by the [facility] On-Call Resident for urgent/emergent questions or concerns.     Interval History:   No acute events overnight. Notes diffuse pain that is not well-controlled, including in bilateral neck. Feels sensation of swelling at the site of RIJ CVC. Denies bruising or bleeding.     Objective:   Temp:  [36.8 ??C (98.3 ??F)-38.8 ??C (101.8 ??F)] 37.7 ??C (99.9 ??F)  Heart Rate:  [91-126] 106  SpO2 Pulse:  [91-126] 107  Resp:  [8-20] 11  BP: (106-129)/(48-66) 129/62  SpO2:  [98 %-100 %] 99 %    Gen: no distress, answers questions appropriately  Neck: RIJ CVC in place with overlying dressing, no bruising bleeding or erythema. Puncture site on left neck clean, dry, without signs of bruising or bleeding.    Lungs: normal work of breathing on room air  Psych: Alert, oriented, appropriate mood and affect    Labs/Studies: Labs and Studies from the last 24hrs per EMR and Reviewed

## 2022-10-24 LAB — HEPATIC FUNCTION PANEL
ALBUMIN: 2.8 g/dL — ABNORMAL LOW (ref 3.4–5.0)
ALKALINE PHOSPHATASE: 85 U/L (ref 46–116)
ALT (SGPT): 90 U/L — ABNORMAL HIGH (ref 10–49)
AST (SGOT): 18 U/L (ref <=34)
BILIRUBIN DIRECT: <0.1 mg/dL (ref 0.00–0.30)
BILIRUBIN TOTAL: 0.2 mg/dL — ABNORMAL LOW (ref 0.3–1.2)
PROTEIN TOTAL: 5.6 g/dL — ABNORMAL LOW (ref 5.7–8.2)

## 2022-10-24 LAB — CBC
HEMATOCRIT: 25.5 % — ABNORMAL LOW (ref 34.0–44.0)
HEMOGLOBIN: 8.5 g/dL — ABNORMAL LOW (ref 11.3–14.9)
MEAN CORPUSCULAR HEMOGLOBIN CONC: 33.2 g/dL (ref 32.0–36.0)
MEAN CORPUSCULAR HEMOGLOBIN: 26.5 pg (ref 25.9–32.4)
MEAN CORPUSCULAR VOLUME: 79.8 fL (ref 77.6–95.7)
MEAN PLATELET VOLUME: 10.8 fL — ABNORMAL HIGH (ref 6.8–10.7)
PLATELET COUNT: 114 10*9/L — ABNORMAL LOW (ref 150–450)
RED BLOOD CELL COUNT: 3.19 10*12/L — ABNORMAL LOW (ref 3.95–5.13)
RED CELL DISTRIBUTION WIDTH: 14.4 % (ref 12.2–15.2)
WBC ADJUSTED: 4.9 10*9/L (ref 3.6–11.2)

## 2022-10-24 LAB — MAGNESIUM: MAGNESIUM: 1.6 mg/dL (ref 1.6–2.6)

## 2022-10-24 LAB — BASIC METABOLIC PANEL
ANION GAP: 6 mmol/L (ref 5–14)
BLOOD UREA NITROGEN: 6 mg/dL — ABNORMAL LOW (ref 9–23)
BUN / CREAT RATIO: 15
CALCIUM: 8.3 mg/dL — ABNORMAL LOW (ref 8.7–10.4)
CHLORIDE: 106 mmol/L (ref 98–107)
CO2: 26 mmol/L (ref 20.0–31.0)
CREATININE: 0.41 mg/dL — ABNORMAL LOW
EGFR CKD-EPI (2021) FEMALE: >90 mL/min/{1.73_m2} (ref >=60)
GLUCOSE RANDOM: 95 mg/dL (ref 70–179)
POTASSIUM: 3.4 mmol/L (ref 3.4–4.8)
SODIUM: 138 mmol/L (ref 135–145)

## 2022-10-24 LAB — VITAMIN B1, WHOLE BLOOD: VITAMIN B1: 298 nmol/L — ABNORMAL HIGH

## 2022-10-24 LAB — VANCOMYCIN, RANDOM: VANCOMYCIN RANDOM: 10.9 ug/mL

## 2022-10-24 LAB — PHOSPHORUS: PHOSPHORUS: 2.6 mg/dL (ref 2.4–5.1)

## 2022-10-24 MED ADMIN — pantoprazole (Protonix) injection 40 mg: 40 mg | INTRAVENOUS | @ 01:00:00

## 2022-10-24 MED ADMIN — micafungin (MYCAMINE) 100 mg in sodium chloride 0.9 % (NS) 100 mL IVPB-MBP: 100 mg | INTRAVENOUS | @ 01:00:00 | Stop: 2022-11-05

## 2022-10-24 MED ADMIN — HYDROmorphone (PF) (DILAUDID) injection 1 mg: 1 mg | INTRAVENOUS | @ 13:00:00 | Stop: 2022-11-04

## 2022-10-24 MED ADMIN — metroNIDAZOLE (FLAGYL) IVPB 500 mg: 500 mg | INTRAVENOUS | @ 19:00:00 | Stop: 2022-10-24

## 2022-10-24 MED ADMIN — midazolam (VERSED) injection: INTRAVENOUS | @ 15:00:00 | Stop: 2022-10-24

## 2022-10-24 MED ADMIN — promethazine (PHENERGAN) 6.5 mg in sodium chloride (NS) 0.9 % 50 mL IVPB: 6.5 mg | INTRAVENOUS | @ 14:00:00

## 2022-10-24 MED ADMIN — fentaNYL (PF) (SUBLIMAZE) injection: INTRAVENOUS | @ 16:00:00 | Stop: 2022-10-24

## 2022-10-24 MED ADMIN — buprenorphine-naloxone (SUBOXONE) 2-0.5 mg SL film 0.5 mg of buprenorphine: .25 | SUBLINGUAL | @ 13:00:00

## 2022-10-24 MED ADMIN — fentaNYL (PF) (SUBLIMAZE) injection: INTRAVENOUS | @ 15:00:00 | Stop: 2022-10-24

## 2022-10-24 MED ADMIN — cetirizine (ZYRTEC) tablet 10 mg: 10 mg | GASTROENTERAL | @ 13:00:00

## 2022-10-24 MED ADMIN — famotidine (PF) (PEPCID) injection 20 mg: 20 mg | INTRAVENOUS | @ 01:00:00

## 2022-10-24 MED ADMIN — HYDROmorphone (PF) (DILAUDID) injection 1 mg: 1 mg | INTRAVENOUS | @ 17:00:00 | Stop: 2022-11-04

## 2022-10-24 MED ADMIN — cefepime (MAXIPIME) 2 g in sodium chloride 0.9 % (NS) 100 mL IVPB-MBP: 2 g | INTRAVENOUS | @ 01:00:00 | Stop: 2022-10-28

## 2022-10-24 MED ADMIN — cefepime (MAXIPIME) 2 g in sodium chloride 0.9 % (NS) 100 mL IVPB-MBP: 2 g | INTRAVENOUS | @ 08:00:00 | Stop: 2022-10-28

## 2022-10-24 MED ADMIN — promethazine (PHENERGAN) 6.5 mg in sodium chloride (NS) 0.9 % 50 mL IVPB: 6.5 mg | INTRAVENOUS

## 2022-10-24 MED ADMIN — cefepime (MAXIPIME) 2 g in sodium chloride 0.9 % (NS) 100 mL IVPB-MBP: 2 g | INTRAVENOUS | @ 19:00:00 | Stop: 2022-10-28

## 2022-10-24 MED ADMIN — midazolam (VERSED) injection: INTRAVENOUS | @ 16:00:00 | Stop: 2022-10-24

## 2022-10-24 MED ADMIN — buprenorphine-naloxone (SUBOXONE) 2-0.5 mg SL film 0.5 mg of buprenorphine: .25 | SUBLINGUAL | @ 01:00:00

## 2022-10-24 MED ADMIN — lidocaine (XYLOCAINE) topical spray: @ 15:00:00 | Stop: 2022-10-24

## 2022-10-24 MED ADMIN — HYDROmorphone (PF) (DILAUDID) injection 1 mg: 1 mg | INTRAVENOUS | @ 05:00:00 | Stop: 2022-11-04

## 2022-10-24 MED ADMIN — vancomycin (VANCOCIN) IVPB 1000 mg (premix): 1000 mg | INTRAVENOUS | @ 08:00:00 | Stop: 2022-10-24

## 2022-10-24 MED ADMIN — pantoprazole (Protonix) injection 40 mg: 40 mg | INTRAVENOUS | @ 13:00:00 | Stop: 2022-10-24

## 2022-10-24 MED ADMIN — metroNIDAZOLE (FLAGYL) IVPB 500 mg: 500 mg | INTRAVENOUS | @ 09:00:00 | Stop: 2022-10-24

## 2022-10-24 MED ADMIN — vancomycin (VANCOCIN) IVPB 1000 mg (premix): 1000 mg | INTRAVENOUS | @ 02:00:00 | Stop: 2022-10-29

## 2022-10-24 MED ADMIN — baclofen (LIORESAL) tablet 20 mg: 20 mg | ORAL | @ 13:00:00

## 2022-10-24 MED ADMIN — naloxegol (MOVANTIK) tablet 25 mg: 25 mg | ORAL | @ 13:00:00

## 2022-10-24 MED ADMIN — buprenorphine-naloxone (SUBOXONE) 2-0.5 mg SL film 0.5 mg of buprenorphine: .25 | SUBLINGUAL | @ 19:00:00

## 2022-10-24 MED ADMIN — ondansetron (ZOFRAN) injection 4 mg: 4 mg | INTRAVENOUS | @ 06:00:00

## 2022-10-24 MED ADMIN — lactated Ringers infusion: 100 mL/h | INTRAVENOUS | @ 11:00:00

## 2022-10-24 MED ADMIN — LORazepam (ATIVAN) injection 1 mg: 1 mg | INTRAVENOUS | @ 08:00:00 | Stop: 2022-10-24

## 2022-10-24 MED ADMIN — oxyCODONE (ROXICODONE) immediate release tablet 10 mg: 10 mg | ORAL | @ 20:00:00 | Stop: 2022-10-28

## 2022-10-24 MED ADMIN — ondansetron (ZOFRAN) injection 4 mg: 4 mg | INTRAVENOUS | @ 20:00:00

## 2022-10-24 MED ADMIN — enoxaparin (LOVENOX) syringe 40 mg: 40 mg | SUBCUTANEOUS | @ 01:00:00

## 2022-10-24 MED ADMIN — gadopiclenol injection 6 mL: 6 mL | INTRAVENOUS | @ 17:00:00 | Stop: 2022-10-24

## 2022-10-24 MED ADMIN — HYDROmorphone (PF) (DILAUDID) injection 1 mg: 1 mg | INTRAVENOUS | @ 02:00:00 | Stop: 2022-11-04

## 2022-10-24 MED ADMIN — LORazepam (ATIVAN) tablet 0.5 mg: .5 mg | ORAL | @ 20:00:00 | Stop: 2022-10-24

## 2022-10-24 MED ADMIN — baclofen (LIORESAL) tablet 20 mg: 20 mg | ORAL | @ 20:00:00

## 2022-10-24 MED ADMIN — metroNIDAZOLE (FLAGYL) IVPB 500 mg: 500 mg | INTRAVENOUS | @ 03:00:00 | Stop: 2022-10-28

## 2022-10-24 MED ADMIN — lactated Ringers infusion: 100 mL/h | INTRAVENOUS | @ 02:00:00

## 2022-10-24 MED ADMIN — LORazepam (ATIVAN) injection 1 mg: 1 mg | INTRAVENOUS | @ 01:00:00

## 2022-10-24 NOTE — Unmapped (Addendum)
Vancomycin Therapeutic Monitoring Pharmacy Note    [name] [name] is a 22 y.o. female continuing vancomycin. Date of therapy initiation: 10/21/22    Indication: Bacteremia/Sepsis    Prior Dosing Information: Current regimen vancomycin 1000 mg IV q8 hours      Goals:  Therapeutic Drug Levels  Vancomycin trough goal: 10-15 mg/L    Additional Clinical Monitoring/Outcomes  Renal function, volume status (intake and output)    Results: Vancomycin level 10.9 mg/L, drawn appropriately (~8 hour level)    Wt Readings from Last 1 Encounters:   10/11/22 60.2 kg (132 lb 11.5 oz)     Creatinine   Date Value Ref Range Status   10/24/2022 0.41 (L) 0.55 - 1.02 mg/dL Final   16/12/9602 5.40 (L) 0.55 - 1.02 mg/dL Final   98/01/9146 8.29 0.55 - 1.02 mg/dL Final        Pharmacokinetic Considerations and Significant Drug Interactions:  Adult (calculated on 8/14): Vd = 47.4 L, ke = 0.167 hr-1  Concurrent nephrotoxic meds: not applicable    Assessment/Plan:  Recommendation(s)  Continue vancomycin 1000 mg IV q8 hours pending final ID recommendations  Estimated trough on recommended regimen:  ~11 mg/L    Follow-up  Level due: in 3-5 days  A pharmacist will continue to monitor and order levels as appropriate    Please page service pharmacist with questions/clarifications.    Rance Muir, PharmD,  BCPS

## 2022-10-24 NOTE — Unmapped (Signed)
Cardiology Treatment Plan    We attempted to use moderate sedation for TEE today. Unfortunately, we were unable to achieve adequate levels of sedation using moderate sedation. She was only minimally sedated with 5mg  of versed and 125 mcg of fentanyl. We discussed her case with our attending Dr. Caroline Sauger. He agreed we should not attempt TEE while she was minimally sedated. Patient agreed. We will attempt to perform TEE with GA early next week.     Cardiology echo team is aware of the plan. The order has been placed and we will schedule in our procedure suite.     Please ensure she is NPO at 0000 8/19 for planned TEE 8/19 during the day.    I discussed the plan with the primary team via epic chat.     If any further questions arise please page the cardiology consult pager [phone]) Monday - Friday from 8AM-5PM or the on-call cardiology pager [phone]) nights and weekends.     Suanne Marker, MD  Cardiovascular Fellow, PGY4  Division of Cardiology

## 2022-10-24 NOTE — Unmapped (Signed)
Cardiac Echo Laboratory  History & Physical Assessment    PCP: Jarold Motto, [facility]  Referring provider(s): Gweneth Fritter Miles Costain, Md  [address]  [id number] [place],  [place] [address]   Primary cardiologist: none  Procedures to be performed:   Transesophageal echocardiogram    Indication: GNR bacteremia and fungemia    Consent: I hereby certify that the nature, purpose, benefits, usual and most frequent risks of, and alternatives to, the operation or procedure have been explained to the patient (or person authorized to sign for the patient) either by a physician or by the provider who is to perform the operation or procedure, that the patient has had an opportunity to ask questions, and that those questions have been answered. The patient or the patient's representative has been advised that selected tasks may be performed by assistants to the primary health care provider(s). I believe that the patient (or person authorized to sign for the patient) understands what has been explained, and has consented to the operation or procedure.  _____________________________________________________________________    HISTORY: 22 y.o. 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 She is now being referred to the echo lab for further evaluation of possible RA/TV vegetation in the setting of bacteremia/fungemia    Assessments:      TTE 10/23/22   1. Technically difficult study.    2. There is a mobile echodensity in the right atrium. Cannot rule out  vegetation on tricuspid valve vs indwelling catheter.    3. The left ventricle is normal in size with normal wall thickness.    4. The left ventricular systolic function is borderline, LVEF is visually  estimated at 50-55%.    5. The right ventricle is not well visualized but probably normal in size,  with probably normal systolic function.     Recommendations    * Recommend follow-up transesophageal echocardiogram if clinical concern for endocarditis    Visit Vitals  BP 103/49   Pulse 78   Temp 36.6 ??C (97.9 ??F) (Oral)   Resp 8   Ht 165.1 cm (5' 5)   Wt 60.2 kg (132 lb 11.5 oz)   SpO2 100%   BMI 22.09 kg/m??       GEN: no acute distress, chronically ill appearing young female  CV: normal rate and regular rhythm  PULM: normal WOB on 2L [place]  EXT: warm and well perfused  NEURO: alert and oriented x4      Suanne Marker, MD  Cardiovascular Fellow, PGY5  Division of Cardiology

## 2022-10-24 NOTE — Unmapped (Addendum)
Division of Infectious Diseases  General Inpatient Consultation Service     For questions about this consult, page [phone] (Gen A Follow-up Pager).      [name] [name] is being seen in consultation at the request of [name] Cloud, MD for evaluation and management of GNR bacteremia.       PLAN FOR 10/24/2022    Diagnostic    FU Bcx sent on 8/13, GNR susceptibilities now available, yeasts ID in progress.  Noted TTE report from 8/15 which showed  Possible mobile echodensity in right atrium; concern for possible vegetation.Awaiting TEE.  FU urine Cx sent 10/23/2022.  Monitor for antimicrobial toxicity with the following:  CBC w/diff at least once per week  CMP at least once per week  clinical assessments for rashes or other skin changes    Treatment  Continue on IV Cefepime   Continue on IV Micafungin.  STOP Vancomycin.  Consider stopping IV Metronidazole.  Duration of therapy = TBD  start date = 10/21/22  end date = TBD    I discussed the plans for today with primary team, family/caregiver(s), and patient on 10/24/2022.    Our service will continue to follow.    I personally spent 45 mins face-to-face and non-face-to-face in the care of this patient on 10/24/2022, which includes all pre, intra, and post visit time on the date of service.  All documented time was specific to the E/M visit and does not include any procedures that may have been performed.      [name], MBBS  [facility] of Infectious Diseases            ID ATTENDING PHYSICIAN ATTESTATION  I saw and evaluated [name] [name], participating in the key portions of the service. Her fevers have resolved, blood culture from 8/13 growing GNRs and Candida albicans. Ophtho exam did not show any evidence of Candida involvement in eyes. TTE showed possible mobile echodensity in R atrium, TEE pending for further evaluation.  [name] [name] was tearful at the time of my visit, has been feeling somewhat better but is concerned about possibility of being transferred out of the [facility] before she's ready - discussed with Primary Team attending physician and they are not planning transfer at this time.  Recommend discontinuation of Vancomycin since no Gram-positive organisms have grown in blood cultures.  Could also discontinue Metronidazole, however [name] [name] prefers not to make any additional changes to her antibiotics at this time. Continue Cefepime and Micafungin to treat Gram-negative bacteremia and Candidemia.  I reviewed the ID fellow's note and agree with the fellow's findings and plan. Clinical status and plan discussed with Primary Team attending,. [TP01]    I personally reviewed this patient's lab & micro results and imaging report(s) and I agree with the findings as reported.    [name] Michael, MD, MS  Division of Infectious Diseases  ---------------------------------------------        MDM and Problem-Specific Assessments  ( .00ID2DAY  /  .09WJXBJYNWGN  /  .IDSS  / .[name] )     22 y.o. 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 S/p R IJ removal on 8/14.10/23/2022 TTE showed possible vegetations in right atrium, awaiting TEE.    October 24, 2022   Patient has: []  acute illness w/systemic sxs  [mod] [  x] illness posing risk to life or function  [high]   I reviewed:   (3+) [x]  primary team note [x]  consultant note(s) []  procedure/op note(s) [x]  micro result(s)    [x]  CBC results [x]  chemistry results [x]  radiology report(s) []  nursing note(s)   I independently visualized:   (any)   []  cxs/plates in lab []  plain film images []  CT images []  PET images    []  path slide(s) []  ECG tracing []  MRI images []  nuclear scan   I discussed: (any) []  micro and/or path w/lab personnel []  drug options and/or interactions w/ID pharmD    []  procedure/OR findings w/other MD(s) []  echo and/or imaging w/other MD(s)    []  mgm't w/attending(s) involved in case []  setting up home abx w/OPAT team   Mgm't requires: []  prescription drug(s)  [mod] [x]  intensive toxicity monitoring  [high]     Interval notes & result reports show: Remain afebrile >24 hrs. VSS on 2L [place]. No acute events overnight. Per ophthalmology note on 8/15, no vitreitis or retinitis either eye.    Patient reports: Feeling a little bit better, no longer febrile.    My interpretation of the data I reviewed is: WBC to 4.9, wnl. No new imaging. 8/13 Bcx 1/4 GNR, family enterobacteriaceae (R-ampi). 8/13 Bcx 2/2 NGTD. TTE (8/15) demonstrated mobile echodensity in right atrium.    # GNR bacteremia  -    ACUTE = LESS THAN 1 YEAR DURATION  # Yeast in blood Cx  Patient admitted since 7/18 with course c/b worsening RLQ abdominal pain in setting of new recurrent fevers and tachycardia. Of note, R IJ for TPN present since 01/2022. Blood cultures on 8/13 grew 1/4 GNR, lactose fermenter, family Enterobacteriaceae, blood culture also growing yeast (Candida albicans).  TTE (8/15) demonstrated mobile echodensity in right atrium, cannot rule out vegetation. Plan is for TEE for additional evaluation.    Management is as per the blue box above.    # Background of Gardner's syndrome, desmoid tumors-  ACUTE = LESS THAN 1 YEAR DURATION  Diagnosed around the age of 1.  Nirogacestat currently on hold.    # Circumferential bladder wall thickening on CT from 10/22/2022 -    ACUTE = LESS THAN 1 YEAR DURATION  Ua from 10/22/2022 positive.    Management is as per the blue box above.    # Muscle rigidity, increased tone and spasms   -    ACUTE = LESS THAN 1 YEAR DURATION  09/25/2022 had injection of desmoid tumors with steroid/anesthetic under US guidance. For desmoid tumors in right forearm and left chest wall. Had multiple injections like these in the past without complications.  Admitted on 7.19 for observations , her workup included MRI spine and CK which were unremarkable.  Management is as per the blue box above.    # Deranged LFT -  ACUTE = LESS THAN 1 YEAR DURATION  ALT 176 from 10/22/2022 now improving.    Management is as per the blue box above.      # Management of prescription antimicrobials needing intensive toxicity monitoring - acute, poses threat to life or bodily function  [high]  Beta-lactams (penicillins, cephalosporins, and carbapenems) can cause rashes, loose stools, nephrotoxicity, and/or myelosuppression.  Cefepime can cause various types of neurotoxicity, including depressed consciousness, encephalopathy, aphasia, myoclonus, seizures, and coma. Metronidazole can cause rashes, dysgeusia (altered taste), peripheral neuropathy, and/or myelosuppression.  Vancomycin can cause back pain, various dermatological effects, thrombocytopenia, neutropenia, and/or nephrotoxicity.   See recommendations in  blue box above.      # Disposition  SUCCINCTLY explain what you anticipate dispo might be (e.g., OPAT candidacy).   TBD            Antimicrobials & Other Medications  ( .00IDGANTT  /  .00IDGANTTLIST  )     Current  Cefepime 8/13-  Metronidazole 8/13-  Vancomycin 8/13-  Micafungin 8/14-    Previous  None     Immunomodulators and antipyretics  None       Current Medications as of 10/24/2022  Scheduled  PRN   baclofen, 20 mg, TID  bisacodyl, 10 mg, BID  buprenorphine-naloxone, 0.25 Film, TID  cefepime, 2 g, Q8H  cetirizine, 10 mg, Daily  [Provider Hold] enoxaparin (LOVENOX) injection, 40 mg, Q24H SCH  enoxaparin (LOVENOX) injection, 40 mg, Q24H SCH  famotidine (PEPCID) IV, 20 mg, At bedtime  metroNIDAZOLE, 500 mg, Q8H  micafungin, 100 mg, Q24H  mirtazapine, 15 mg, Nightly  naloxegol, 25 mg, Daily  [Provider Hold] nirogacestat, 50 mg, BID  pantoprazole (Protonix) intravenous solution, 40 mg, BID  polyethylene glycol, 17 g, BID  senna, 2 tablet, Nightly  vancomycin, 1,000 mg, Q8H      acetaminophen, 650 mg, Q6H PRN  aluminum-magnesium hydroxide-simethicone, 60 mL, Q6H PRN  calcium carbonate, 400 mg elem calcium, Daily PRN  Chemo Clarification Order, , Continuous PRN  fat emulsion 20 %, 100 mL, Once PRN  fat emulsion 20 %, 200 mL, Once PRN  guaiFENesin, 200 mg, Q4H PRN  HYDROmorphone, 1 mg, Q3H PRN  IP okay to treat, , Continuous PRN  lidocaine, , TID PRN  LORazepam, 1 mg, Q8H PRN  LORazepam, 0.5 mg, TID PRN  lidocaine-diphenhydrAMINE-aluminum-magnesium, 10 mL, QID PRN  ondansetron, 4 mg, Q12H PRN  oxyCODONE, 10 mg, Q4H PRN  phenol, 2 spray, Q2H PRN  polyethylene glycol, 17 g, Daily PRN  promethazine, 6.5 mg, Q6H PRN  pseudoephedrine, 30 mg, Daily PRN  zinc oxide-cod liver oil, , Daily PRN           Physical Exam     Temp:  [36.6 ??C (97.9 ??F)-36.8 ??C (98.3 ??F)] 36.6 ??C (97.9 ??F)  Heart Rate:  [78-103] 78  SpO2 Pulse:  [78-101] 78  Resp:  [8-18] 8  BP: (103-147)/(49-76) 103/49  MAP (mmHg):  [66-99] 66  SpO2:  [99 %-100 %] 100 %    Actual body weight: 60.2 kg (132 lb 11.5 oz)  Ideal body weight: 57 kg (125 lb 10.6 oz)  Adjusted ideal body weight: 58.3 kg (128 lb 7.7 oz)      Const [x]  vital signs above      []  WDWN, NAD, non-toxic appearance  [x]  Chronically ill appearing, has oral thrush.      Eyes   [x]  Lids normal bilaterally, conjunctiva anicteric and noninjected OU  [x]  PERRL   []        ENMT     [x]  Normal appearance of external nose and ears       [x]  OP notable for white patch on tongue, no other visible lesions    [x]  MMM, no lesions on lips or gums, dentition good        [x]  Hearing normal   [x]  NG tube in place.      Neck    [x]  Neck of normal appearance and trachea midline        [x]  No thyromegaly, nodules, or tenderness   [x]  New central line inserted on the right.      Lymph    [  x] No LAD in neck       []  No LAD in supraclavicular area       []  No LAD in axillae   []  No LAD in epitrochlear chains       []  No LAD in inguinal areas  []        CV    [x]  RRR, no m/r/g, S1/S2       [x]  No peripheral edema, WWP       [x]  Pedal pulses intact   []        Resp   [x]  Normal WOB       [x]  CTAB   []        GI   []  Normal inspection, NTND, NABS       []  No umbilical hernia on exam       []  No hepatosplenomegaly       []  Inspection of perineal and perianal areas normal  [x]  Scar in the RLQ, no obvious peristalsis shown on inspection, Abdomen tender on superficial palpation,has increased bowel sounds.      GU   []  Normal external genitalia       [x]  Deferred.      MSK   []  No clubbing or cyanosis of hands       []  No focal tenderness or abnormalities on palpation of joints in RUE, LUE, RLE, or LLE  [x]  Desmoid tumour on right arm and left side of chest.      Skin   [x]  No rashes, lesions, or ulcers of visualized skin       [x]  Skin warm and dry to palpation   []        Neuro   [x]  CNs II-XII grossly intact       [x]  Sensation to light touch grossly intact throughout   [x]  DTRs normal and symmetric throughout   []  Unable to assess due to critical illness, sedation, or mental status  []        Psych   [x]  Appropriate affect      [x]  Oriented to person, place, time      [x]  Judgment and insight are appropriate   []  Unable to assess due to critical illness, sedation, or mental status  []           Patient Lines/Drains/Airways Status       Active Active Lines, Drains, & Airways       Name Placement date Placement time Site Days    CVC Triple Lumen 10/22/22 Non-tunneled Right Internal jugular 10/22/22  1521  Internal jugular  1    NG/OG Tube Decompression Right nostril 09/26/22  1000  Right nostril  27                      Data for ID Decision Making  ( IDGENCONMDM )       Micro & Serological Data   ( RSLTMICRO  /  [id number]  /  00CXSRC  /  00CXRES  /  00CXSUSC )    Microbiology Results (last day)       Procedure Component Value Date/Time Date/Time    Blood Culture #2 [[id number]]  (Normal) Collected: 10/21/22 0714    Lab Status: Preliminary result Specimen: Blood from 1 Peripheral Draw Updated: 10/24/22 0745     Blood Culture, Routine No Growth at 72 hours    Blood Culture #1 [[id number]]  (Normal) Collected: 10/21/22 1814    Lab Status: Preliminary result Specimen: Blood from 1 Peripheral  Draw Updated: 10/23/22 1845     Blood Culture, Routine No Growth at 48 hours    Blood Culture #2 [[id number]]  (Normal) Collected: 10/21/22 1815    Lab Status: Preliminary result Specimen: Blood from 1 Peripheral Draw Updated: 10/23/22 1845     Blood Culture, Routine No Growth at 48 hours    Blood Culture #1 [[id number]]  (Abnormal)  (Susceptibility) Collected: 10/21/22 0715    Lab Status: Preliminary result Specimen: Blood from 1 Peripheral Draw Updated: 10/23/22 1613     Blood Culture, Routine Gram Negative Rod, Family Enterobacteriaceae     Gram Stain Result Gram negative rods (bacilli)      Yeast    Susceptibility       Gram Negative Rod, Family Enterobacteriaceae (1)       Antibiotic Interpretation Method Status    Ampicillin Resistant KIRBY BAUER Preliminary    Cefazolin Susceptible KIRBY BAUER Preliminary    Ciprofloxacin Susceptible KIRBY BAUER Preliminary    Gentamicin Susceptible KIRBY BAUER Preliminary    Levofloxacin Susceptible KIRBY BAUER Preliminary    Piperacillin + Tazobactam Susceptible KIRBY BAUER Preliminary                       Urine Culture [[id number]] Collected: 10/23/22 1351    Lab Status: In process Specimen: Urine from Clean Catch Updated: 10/23/22 1415              7/19 Bcx NGTD  8/13 Bcx 1/2 GNR, family enterobacteriaceae (R-ampi; S-cefazolin, cipro, gent, levo, pip+tazo)  8/13 Bcx 2/2 NGTD    Recent Studies  ( RISRSLT )    Echocardiogram Follow Up/Limited Echo    Result Date: 10/23/2022  Patient Info Name:     [name] Age:     21 years DOB:     2000/12/26 Gender:     Female MRN:     [id number] Accession #:     [id number] UN Account #:     [id number] Ht:     165 cm Wt:     60 kg BSA:     1.66 m2 BP:     129 /     62 mmHg Exam Date:     10/23/2022 3:47 PM Admit Date:     09/25/2022     Exam Type:     ECHOCARDIOGRAM FOLLOW UP/LIMITED ECHO     Technical Quality:     Poor     Reason for Poor Study:     poor echocardiographic windows     Staff Sonographer:     Ezequiel Kayser Referring Physician:     Miles Costain Ossman     Study Info Indications      - Fungemia Procedure(s)   Limited 2D, color flow and Doppler transthoracic echocardiogram is performed.         Summary   1. Technically difficult study.   2. There is a mobile echodensity in the right atrium. Cannot rule out vegetation on tricuspid valve vs indwelling catheter.   3. The left ventricle is normal in size with normal wall thickness.   4. The left ventricular systolic function is borderline, LVEF is visually estimated at 50-55%.   5. The right ventricle is not well visualized but probably normal in size, with probably normal systolic function.     Recommendations   * Recommend follow-up transthoracic echocardiogram if clinical concern for endocarditis.         Left Ventricle   The left ventricle is normal in  size with normal wall thickness. The left ventricular systolic function is borderline, LVEF is visually estimated at 50-55%.     Right Ventricle   The right ventricle is not well visualized but probably normal in size, with probably normal systolic function.         Right Atrium   There is a mobile echodensity in the right atrium. Cannot rule out vegetation on tricuspid valve vs indwelling catheter.         Aortic Valve   The aortic valve is trileaflet with normal appearing leaflets with normal excursion. There is no significant aortic regurgitation.     Mitral Valve   The mitral valve leaflets are normal with normal leaflet mobility. There is no significant mitral valve regurgitation.     Tricuspid Valve   The tricuspid valve leaflets are normal, with normal leaflet mobility. There is trivial tricuspid regurgitation. There is no pulmonary hypertension. TR maximum velocity: 1.5 m/s.     Pulmonic Valve   The pulmonic valve is poorly visualized, but probably normal. There is trivial pulmonic regurgitation. There is no evidence of a significant transvalvular gradient.         Pericardium/Pleural   There is no pericardial effusion.     Other Findings   Rhythm: Sinus Rhythm.         Ventricles ---------------------------------------------------------------------- Name                                 Value        Normal ----------------------------------------------------------------------     LV Dimensions 2D/MM ----------------------------------------------------------------------  IVS Diastolic Thickness (2D)                                0.8 cm       0.6-0.9 LVID Diastole (2D)                  4.1 cm       3.8-5.2  LVPW Diastolic Thickness (2D)                                0.8 cm       0.6-0.9 LVID Systole (2D)                   3.2 cm       2.2-3.5 LV Mass Index (2D Cubed)           58 g/m2         43-95  Relative Wall Thickness (2D)                                  0.39     Tricuspid Valve ---------------------------------------------------------------------- Name                                 Value        Normal ----------------------------------------------------------------------     TV Regurgitation Doppler ---------------------------------------------------------------------- TR Peak Velocity                   1.5 m/s         Report Signatures Resident Suanne Marker on 10/23/2022 05:04 PM    US Liver Doppler  Result Date: 10/23/2022  EXAM: US LIVER DOPPLER ACCESSION: [id number] UN     CLINICAL INDICATION: 22 years old with Abnormal LFTs, GNR/yeast growing on blood cultures, better characterize small lesions noted on CT      COMPARISON: CT abdomen/pelvis 10/21/2022     TECHNIQUE: Ultrasound views of the liver vasculature were obtained using grayscale, color Doppler, and spectral Doppler analysis     FINDINGS:     Evaluation limited by lack of patient mobility and patient discomfort during examination.     PORTAL VEIN: The main, left and right portal veins were patent with hepatopetal flow. Normal main portal vein velocity (0.20 m/s or greater)      Main portal vein diameter:   1.0  cm          Main portal vein velocity: 0.34 m/s      Anterior right portal vein velocity:   0.46  m/s      Posterior right portal vein velocity:  0.23   m/s      Left portal vein velocity:   0.14  m/s          Main portal vein flow: hepatopetal      Right portal vein flow: hepatopetal      Left portal vein flow: hepatopetal     SPLENIC VEIN: Patent, with hepatopetal flow.      Splenic vein midline: hepatopetal      Splenic vein proximal: hepatopetal     HEPATIC VEINS/IVC: The IVC, left, middle and right hepatic veins were were patent.      Left hepatic vein phasicity/flow: triphasic      Middle hepatic vein phasicity/flow: bi-tri      Right hepatic vein phasicity/flow: bi-tri      Inferior vena cava phasicity/flow: mono-bi     HEPATIC ARTERY: Limited evaluation of the common hepatic artery. It appears patent on Doppler imaging, although with turbulent flow, but this is likely artifactual.     OTHER: No ascites. Partially imaged layering echogenic debris in the gallbladder, corresponding to biliary sludge demonstrated on CT abdomen/pelvis 10/21/2022.             Patent hepatic vasculature with normal flow although with sluggish velocities in the right posterior and left portal veins.                             IR Remove Tunneled Catheter    Result Date: 10/23/2022  PROCEDURE: Tunneled central venous catheter removal ACCESSION: [id number] UN     Procedural Personnel Attending physician(s): Dr. Claretta Fraise Resident physician(s): Dr. Leone Payor Advanced practice provider(s): None     Pre-procedure diagnosis: Bacteremia Post-procedure diagnosis: Same Indication: Bacteremia or sepsis of unknown source Additional clinical history: None _______________________________________________________________     PROCEDURE SUMMARY: - Tunneled central venous catheter removal - Additional procedure(s): None PROCEDURE DETAILS:     Pre-procedure Consent: Informed consent for the procedure including risks, benefits and alternatives was obtained and time-out was performed prior to the procedure. Preparation: The site was prepared and draped using maximal sterile barrier technique including cutaneous antisepsis.     Anesthesia/sedation Level of anesthesia/sedation: No sedation Anesthesia/sedation administered by: Not applicable     Catheter removal Local anesthesia was administered. The catheter was removed with a combination of traction and blunt dissection. Fluoroscopic images were not obtained to document removal.     Closure Hemostasis was achieved with manual compression. Sterile dressing(s) applied.     Contrast Contrast agent:  Omnipaque 300 Contrast volume (mL): 0     Radiation Dose Fluoroscopy time (minutes): 0  Reference Air Kerma (mGy): 0     Additional Details Additional description of procedure: None Registry event: Z/3/f Device used: None Equipment details: None Specimens removed: Tunneled central venous catheter. Estimated blood loss (mL): Less than 10 Standardized report: [email or web address]_v3.1     Attestation Signer name: Claretta Fraise I attest that I supervised the procedure and was immediately available. I reviewed the stored images and agree with the report as written. _______________________________________________________________     Complications: No immediate complications.             Removal of right-sided tunneled powerline catheter in the internal jugular vein.     Plan:     Please re-consult interventional radiology if new catheter placement is desired.         XR Chest Portable    Result Date: 10/22/2022  EXAM: XR CHEST PORTABLE ACCESSION: [id number] UN     CLINICAL INDICATION: CATHETER VASCULAR FIT&ADJ      TECHNIQUE: Single View AP Chest Radiograph.     COMPARISON: 10/13/2022     FINDINGS:     Right internal jugular venous catheter with tip terminating at the superior cavoatrial junction. Enteric tube enters the stomach.     Lungs are clear.  No pleural effusion or pneumothorax.     Normal heart size and mediastinal contours.             New right internal jugular venous catheter with tip terminating at the superior cavoatrial junction           Initial Consult Documentation from October 22, 2022     Sources of information include: chart review, patient, and family/friend(s).    History of Present Illness:     22 y.o. 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    Patient has been admitted to [facility] since 7/18 with chronic abdominal pain and restarted on TPN in the setting of low PO intake. Patient was noted with chronic abdominal pain and nausea that is secondary to desmoid tumors and prior surgeries. More recently on 8/11, patient was noted with peristalsis in right lower quadrant. She was last noted with bowel movement on 8/11. Since, she has had worsening abdominal pain. Overnight of 8/12, patient was febrile to 38.7 despite tylenol and tachycardic to 140s. Since, patient has been having recurrent fevers and tachycardic with no leukocytosis. CTAP (8/13) was unremarkable for acute processes and demonstrated no small bowel obstruction. Blood cultures on 8/13 grew 1/2 GNR. Patient was started on cefepime, vancomycin, and metronidazole on 8/13. Right IJ was removed on 8/14.    Of note, pouchoscopy on 02/2022 showed 1cm of circumferential ulceration with severe, active pouchitis which improved ~2 months later. Repeat pouchoscopy on 07/16/22 demonstrated small ulcerations on suture line and a rectal cuff beginning 1cm from anal verge and extending 2cm with healthy appearing mucosa. Patient has been on TPN since 09/28/22 through nontunneled right IJ that has been present since 01/2022.    Past Medical History   Patient  has a past medical history of Abdominal pain, Acid reflux, Anesthesia complication, Cancer (CMS-HCC), Cataract of right eye, COVID-19 virus infection (01/2019), Cyst of thyroid determined by ultrasound, [name], Difficult intravenous access, [name] (familial adenomatous polyposis), [name], Gastric polyps, History  of chemotherapy, History of colon polyps, History of COVID-19 (01/2019), Iron deficiency anemia due to chronic blood loss, PONV (postoperative nausea and vomiting), Rectal bleeding, and Syncopal episodes.      Meds and Allergies  Patient has a current medication list which includes the following prescription(s): lorazepam, promethazine, acetaminophen, baclofen, cefdinir, cholecalciferol (vitamin d3 25 mcg (1,000 units)), clobetasol, diclofenac sodium, doxycycline, famotidine, hydrocortisone, lidocaine, lorazepam, mirtazapine, naloxone, nirogacestat, oxycodone, pimecrolimus, triamcinolone, and zinc oxide, and the following Facility-Administered Medications: acetaminophen, aluminum-magnesium hydroxide-simethicone, baclofen, bisacodyl, buprenorphine-naloxone, calcium carbonate, cefepime (MAXIPIME) 2 g in sodium chloride 0.9 % (NS) 100 mL IVPB-MBP, cetirizine, CHEMO CLARIFICATION ORDER, [Provider Hold] enoxaparin, enoxaparin, famotidine (pf), fat emulsion 20 %, fat emulsion 20 %, guaifenesin, hydromorphone (pf), IP OKAY TO TREAT, lactated ringers, lidocaine, lorazepam, lorazepam, metronidazole, micafungin (MYCAMINE) 100 mg in sodium chloride 0.9 % (NS) 100 mL IVPB-MBP, mirtazapine, lidocaine-diphenhydramine-aluminum-magnesium, naloxegol, [Provider Hold] nirogacestat, ondansetron, oxycodone, pantoprazole, phenol, polyethylene glycol, polyethylene glycol, promethazine (PHENERGAN) 6.5 mg in sodium chloride (NS) 0.9 % 50 mL IVPB, pseudoephedrine, senna, sodium chloride, vancomycin, and zinc oxide-cod liver oil.    Allergies: Adhesive tape-silicones; Ferrlecit [sodium ferric gluconat-sucrose]; Levofloxacin; Methylnaltrexone; Neomycin; Papaya; Morphine; Zosyn [piperacillin-tazobactam]; Compazine [prochlorperazine]; Iron analogues; Iron dextran; and Latex, natural rubber       Social History  Patient  reports that she has never smoked. She has been exposed to tobacco smoke. She has never used smokeless tobacco. She reports that she does not drink alcohol and does not use drugs.          Scribe's Attest:  [name], MBBCh obtained and performed the history, physical exam and medical decision making elements that were entered into the chart. Documentation assistance was provided by me personally. Signed by Paulina Fusi, Scribe, on October 24, 2022 at 8:35 AM.     Provider???s Attestation:   Documentation assistance provided by the Scribe, Paulina Fusi. I was present during the time the encounter was [place], MBBCh. October 24, 2022 at 8:35 AM

## 2022-10-24 NOTE — Unmapped (Signed)
ICU TRANSPORT NOTE    Destination: MRI    Departing Unit: [facility]  Pickup Time: 1240    Return Unit: [facility]  Return Time: 1400    [place] patient ID band verified  Allergies Reviewed  Code Status at time of transport: Full    Report received from primary nurse via [facility]. Handoff performed . Patient transported via stretcher under stepdown level of care. See vital signs during transport via [employer]. O2 via RA. Patient is following commands and drowsy. Patient tolerated procedure/scan well. Universal precautions maintained throughout transport. During return transport to room waiting for elevator the patient gradually became more and more ridgid, HR spited to 120's, pt tearful, able to follow commands to look directly at me, blink, squeeze my hands.  Pt returned to unit and returned to bed. Primary RN and family  updated. This is not a new finding.    Update and care given to primary nurse. See Doc Flowsheets/MAR for additional transportation documentation. Proper body mechanics and safe patient handling equipment were utilized throughout transport.

## 2022-10-24 NOTE — Unmapped (Signed)
Pt is A&Ox4, VSS, O2 sats >90% on [place] at 2L. Afebrile. Consistent c/o pain. Received PRNs and scheduled medications with slight relief. Received PRNs for nausea with some relief. UO adequate, 3 BM this shift. Pt was NPO for liver ultrasound, and since being back on a regular diet has had a diminished appetite. NG tube at low intermittent suction. Skin intact, Q2H turns and standard precautions maintained. No falls/injuries this shift. Mother at bedside. All monitors with appropriate alarm settings, call bell within reach, see flowsheets/MAR for further info.

## 2022-10-25 LAB — BASIC METABOLIC PANEL
ANION GAP: 8 mmol/L (ref 5–14)
BLOOD UREA NITROGEN: 6 mg/dL — ABNORMAL LOW (ref 9–23)
BUN / CREAT RATIO: 13
CALCIUM: 9 mg/dL (ref 8.7–10.4)
CHLORIDE: 108 mmol/L — ABNORMAL HIGH (ref 98–107)
CO2: 26 mmol/L (ref 20.0–31.0)
CREATININE: 0.45 mg/dL — ABNORMAL LOW
EGFR CKD-EPI (2021) FEMALE: >90 mL/min/{1.73_m2} (ref >=60)
GLUCOSE RANDOM: 94 mg/dL (ref 70–179)
POTASSIUM: 3.7 mmol/L (ref 3.4–4.8)
SODIUM: 142 mmol/L (ref 135–145)

## 2022-10-25 LAB — CBC
HEMATOCRIT: 27.3 % — ABNORMAL LOW (ref 34.0–44.0)
HEMOGLOBIN: 9 g/dL — ABNORMAL LOW (ref 11.3–14.9)
MEAN CORPUSCULAR HEMOGLOBIN CONC: 33 g/dL (ref 32.0–36.0)
MEAN CORPUSCULAR HEMOGLOBIN: 26.7 pg (ref 25.9–32.4)
MEAN CORPUSCULAR VOLUME: 81 fL (ref 77.6–95.7)
MEAN PLATELET VOLUME: 10.7 fL (ref 6.8–10.7)
PLATELET COUNT: 139 10*9/L — ABNORMAL LOW (ref 150–450)
RED BLOOD CELL COUNT: 3.37 10*12/L — ABNORMAL LOW (ref 3.95–5.13)
RED CELL DISTRIBUTION WIDTH: 14.5 % (ref 12.2–15.2)
WBC ADJUSTED: 4.4 10*9/L (ref 3.6–11.2)

## 2022-10-25 LAB — HEPATIC FUNCTION PANEL
ALBUMIN: 3.1 g/dL — ABNORMAL LOW (ref 3.4–5.0)
ALKALINE PHOSPHATASE: 363 U/L — ABNORMAL HIGH (ref 46–116)
ALT (SGPT): 371 U/L — ABNORMAL HIGH (ref 10–49)
AST (SGOT): 427 U/L — ABNORMAL HIGH (ref <=34)
BILIRUBIN DIRECT: 0.1 mg/dL (ref 0.00–0.30)
BILIRUBIN TOTAL: 0.3 mg/dL (ref 0.3–1.2)
PROTEIN TOTAL: 6.1 g/dL (ref 5.7–8.2)

## 2022-10-25 LAB — MAGNESIUM: MAGNESIUM: 1.7 mg/dL (ref 1.6–2.6)

## 2022-10-25 LAB — PHOSPHORUS: PHOSPHORUS: 3.7 mg/dL (ref 2.4–5.1)

## 2022-10-25 MED ADMIN — promethazine (PHENERGAN) 6.5 mg in sodium chloride (NS) 0.9 % 50 mL IVPB: 6.5 mg | INTRAVENOUS

## 2022-10-25 MED ADMIN — polyethylene glycol (MIRALAX) packet 17 g: 17 g | ORAL | @ 16:00:00

## 2022-10-25 MED ADMIN — baclofen (LIORESAL) tablet 20 mg: 20 mg | ORAL | @ 19:00:00

## 2022-10-25 MED ADMIN — HYDROmorphone (PF) (DILAUDID) injection 1 mg: 1 mg | INTRAVENOUS | @ 09:00:00 | Stop: 2022-11-04

## 2022-10-25 MED ADMIN — LORazepam (ATIVAN) injection 1 mg: 1 mg | INTRAVENOUS | @ 03:00:00

## 2022-10-25 MED ADMIN — LORazepam (ATIVAN) injection 1 mg: 1 mg | INTRAVENOUS | @ 17:00:00

## 2022-10-25 MED ADMIN — promethazine (PHENERGAN) 6.5 mg in sodium chloride (NS) 0.9 % 50 mL IVPB: 6.5 mg | INTRAVENOUS | @ 13:00:00

## 2022-10-25 MED ADMIN — cefepime (MAXIPIME) 2 g in sodium chloride 0.9 % (NS) 100 mL IVPB-MBP: 2 g | INTRAVENOUS | Stop: 2022-10-28

## 2022-10-25 MED ADMIN — buprenorphine-naloxone (SUBOXONE) 2-0.5 mg SL film 0.5 mg of buprenorphine: .25 | SUBLINGUAL | @ 19:00:00

## 2022-10-25 MED ADMIN — HYDROmorphone (PF) (DILAUDID) injection 1 mg: 1 mg | INTRAVENOUS | @ 13:00:00 | Stop: 2022-11-04

## 2022-10-25 MED ADMIN — HYDROmorphone (PF) (DILAUDID) injection 1 mg: 1 mg | INTRAVENOUS | Stop: 2022-11-04

## 2022-10-25 MED ADMIN — cetirizine (ZYRTEC) tablet 10 mg: 10 mg | GASTROENTERAL | @ 16:00:00

## 2022-10-25 MED ADMIN — naloxegol (MOVANTIK) tablet 25 mg: 25 mg | ORAL | @ 16:00:00

## 2022-10-25 MED ADMIN — buprenorphine-naloxone (SUBOXONE) 2-0.5 mg SL film 0.5 mg of buprenorphine: .25 | SUBLINGUAL | @ 02:00:00

## 2022-10-25 MED ADMIN — ondansetron (ZOFRAN) injection 4 mg: 4 mg | INTRAVENOUS | @ 09:00:00

## 2022-10-25 MED ADMIN — HYDROmorphone (PF) (DILAUDID) injection 1 mg: 1 mg | INTRAVENOUS | @ 05:00:00 | Stop: 2022-11-04

## 2022-10-25 MED ADMIN — baclofen (LIORESAL) tablet 20 mg: 20 mg | ORAL | @ 16:00:00

## 2022-10-25 MED ADMIN — senna (SENOKOT) tablet 2 tablet: 2 | ORAL | @ 02:00:00

## 2022-10-25 MED ADMIN — HYDROmorphone (PF) (DILAUDID) injection 1 mg: 1 mg | INTRAVENOUS | @ 19:00:00 | Stop: 2022-11-04

## 2022-10-25 MED ADMIN — lactated Ringers infusion: 100 mL/h | INTRAVENOUS | @ 11:00:00

## 2022-10-25 MED ADMIN — cefepime (MAXIPIME) 2 g in sodium chloride 0.9 % (NS) 100 mL IVPB-MBP: 2 g | INTRAVENOUS | @ 17:00:00 | Stop: 2022-10-28

## 2022-10-25 MED ADMIN — micafungin (MYCAMINE) 100 mg in sodium chloride 0.9 % (NS) 100 mL IVPB-MBP: 100 mg | INTRAVENOUS | @ 02:00:00 | Stop: 2022-11-05

## 2022-10-25 MED ADMIN — pantoprazole (Protonix) EC tablet 40 mg: 40 mg | ORAL | @ 16:00:00

## 2022-10-25 MED ADMIN — buprenorphine-naloxone (SUBOXONE) 2-0.5 mg SL film 0.5 mg of buprenorphine: .25 | SUBLINGUAL | @ 13:00:00

## 2022-10-25 MED ADMIN — promethazine (PHENERGAN) 6.5 mg in sodium chloride (NS) 0.9 % 50 mL IVPB: 6.5 mg | INTRAVENOUS | @ 20:00:00

## 2022-10-25 MED ADMIN — cefepime (MAXIPIME) 2 g in sodium chloride 0.9 % (NS) 100 mL IVPB-MBP: 2 g | INTRAVENOUS | @ 09:00:00 | Stop: 2022-10-28

## 2022-10-25 MED ADMIN — enoxaparin (LOVENOX) syringe 40 mg: 40 mg | SUBCUTANEOUS | @ 02:00:00

## 2022-10-25 MED ADMIN — lactated Ringers infusion: 100 mL/h | INTRAVENOUS | @ 22:00:00

## 2022-10-25 MED ADMIN — pantoprazole (Protonix) EC tablet 40 mg: 40 mg | ORAL | @ 02:00:00

## 2022-10-25 MED ADMIN — lactated Ringers infusion: 100 mL/h | INTRAVENOUS | @ 02:00:00

## 2022-10-25 MED ADMIN — famotidine (PEPCID) tablet 20 mg: 20 mg | ORAL | @ 02:00:00

## 2022-10-25 MED ADMIN — baclofen (LIORESAL) tablet 20 mg: 20 mg | ORAL | @ 02:00:00

## 2022-10-25 NOTE — Unmapped (Signed)
Hospital Medicine Daily Progress Note    Assessment/Plan:    Principal Problem:    Other acute postprocedural pain  Active Problems:    [name]    Intestinal polyps    [name]    Neoplasm related pain    Physical deconditioning    History of colectomy    Intractable abdominal pain    Generalized anxiety disorder with panic attacks    SBO (small bowel obstruction) (CMS-HCC)    Small intestinal bacterial overgrowth (SIBO)   Malnutrition Evaluation as performed by RD, LDN: Patient does not meet AND/ASPEN criteria for malnutrition at this time (09/29/22 1439)             [name] [name] is a 22 year old woman w/ 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 She was put back on TPN and remained admitted due to abdominal pain and debilitating muscle spasms/rigidity of uncertain etiology. Hospital course now further complicated by bacteremia/fungemia.    GNR bacteremia  Candida fungemia 8/13  Source now presumably her central line that she was getting TPN through. She is no longer having fevers and is clinically slowly improving (no longer as tachycardic, taking small amounts of oral intake).   PLAN  - Consulted ID; they are following, thank you for your help!  - Central line exchanged 8/14 while on broad spectrum antibacterial coverage but not fungal  - Repeat cultures ordered for 8/17 AM (72 hours given shortage)  - Narrow to cefepime and micafungin  - TTE abnormal so awaiting TEE under general anesthesia on Monday    RLQ abdominal pain  Chronic abdominal pain, nausea 2/2 desmoid tumors and prior surgeries  Ileus - resolved  She is moving her bowels again but remains distended, though much softer today. Abdomen is usually flat and soft. Has bowel sounds. Now the goal is to advance diet and get her NG tube out. I think it is contributing to her nausea/discomfort and she is at risk of damage to her nose given how long it has been in. She has been refusing removal due to fear she will get obstructed again/need it replaced.   PLAN  - Continue scheduled bowel regimen  - Strongly recommend trial of NG tube removal in next 24-48 hours  - mIVFs ordered until drinking better  - Pain control  - Encouraging her to limit opiates as much as possible    Muscle rigidity, increased tone and spasms   Occurred post-procedurally, with reassuring evaluation (MRI spine, neuro c/s, CK normal) at that time. Increased tone seems to worsen during acute stress to her body (anesthesia, infection). Increased baclofen does not seem to be helping much.  Neurology re-engaged 8/11 and recommended further evaluation for a central process (MRI), nutritional deficiencies/excess levels. Most of these results are back now.May also benefit from EMG, need to have them follow up again.   PLAN  - Appreciate chronic pain and PM&R: increased baclofen to 20  - MRI brain back as of 8/16 PM, reassuring  - Neurology needs to be paged to review imaging, consider EMG?  - Follow up nutritional studies: zinc is slightly low, talk to nutrition/TPN team  - Baclofen 20 TID  - Track down HARD boots in PM&R note, ask PT/OT to help?    Elevated LFTs, intermittent  AST/ALT 400s and now nearly normal again. Mildly elevated a few days ago as well. Unclear  etiology - unclear if this could be related to TPN/over feeding. Discussed with pharmacy/TPN - not much to do if that's the cause, they are monitoring as well. Mother also asked if it could be a side effect of her new chemotherapy, which pharmacy reports is possible.   - Liver ultrasound with doppler: unremarkable  PLAN  - Plan to discuss with oncology re: if her chemotherapy could have caused the transaminitis, currently on hold anyway due to acute illness/ileus but they should weigh in prior to restarting it  - Continue to monitor q72 hours (now that it is resolve again) or weekly    Syncope and pre-syncope, uncertain etiology  Possibly vasovagal vs adverse effect of medications vs deconditioning vs autonomic dysfunction. Previously worked up for PE, with negative CTA. Cosyntropin stim test and thyroid studies normal. Remains in sinus without events (pauses) on telemetry.   PLAN  - Limit polypharmacy as able, wean IV medications  - Bolus LR as needed    Iron deficiency anemia  Ferritin 5, iron saturation 6%. Has had prior reactions with numerous attempts to give IV iron.   - S/p Feraheme 1020 mg. Received pre-med protocol per A&I  - Would not give IV iron except when it cannot be avoided given severe itching/stress it causes her    Headaches  Minimal improvement with IV fluids, magnesium, IV Benadryl. Concerning for rebound/medication overuse headache. Headache has improved with Tylenol holiday and ketamine though not completely resolved. Though her chemotherapy also has headaches as a common side effect.  PLAN  - Enteral PRN tylenol, fioricet daily prn could be considered if remains severe  - Avoid NSAIDs, avoid IV benadryl    Desmoid tumors - [name]  - Restarting nirogacestat at lower dose of 50 mg BID on 8/8 after discussion with primary oncologist Dr. [name]. ON HOLD in setting of infections.  - Patient/family state he offered to take her to [facility] when she is floor appropriate. Will need to reach out/clarify plans with their team.    Chronic/stable/resolved problems:    SIBO: Hold home cefdinir, doxy    FEN: Encourage regular diet, TPN  VTE ppx: Lovenox while acutely infected to reduce risk of clots  Code status: Full code  Dispo: Home with HH PT/OT/TPN vs SNF pending ability to stand/walk    I personally spent 75 minutes face-to-face and non-face-to-face in the care of this patient, which includes all pre, intra, and post visit time on the date of service. All documented time was specific to the E/M visit and does not include any procedures that may have been performed.    Chauncy Lean, MD  Hospital Medicine   ___________________________________________________________________    Subjective:  Anxiety is high today. Not ready to move to floor. Has a lot of nursing needs (time, frequent meds/prns) but is actually much more stable/improving. Will increase oral intake and needs NG tube out but scared. Moving bowels. Still feels like she is going to faint frequently. Took a few steps today. Her psychologist is retiring and came to say goodbye today, many tears about this loss.      Labs/Studies:  Labs per EMR and Reviewed (last 24hrs)    Objective:  BP 119/61  - Pulse 77  - Temp 36.4 ??C (97.5 ??F) (Oral)  - Resp 12  - Ht 165.1 cm (5' 5)  - Wt 60.2 kg (132 lb 11.5 oz)  - SpO2 99%  - BMI 22.09 kg/m??     GEN: NAD, fatigued, looking  much better  HEENT: NG clamped, MMM, normal hearing  CV: Regular rate, well perfused  EXT: Grossly symmetric, very mild swelling in bilateral feet but not legs  ABD: Soft, mildly distended, tender, +bs   SKIN: No rashes or erythema  NEURO: Muscle rigidity/increased tone in lower extremities, generalized ~4/5 weakness  PSYCH: Stressed, cooperative, appropriate

## 2022-10-25 NOTE — Unmapped (Signed)
Pt AOx4, afebrile, VSS this shift. Oxygen >90% on 2LNC, no desat episodes. Adequate UOP, no BM this shift. NGT remains bridled in, now clamped per order. Pt went for TEE but was unable to complete and rescheduled for Monday 8/19. MRI of brain was able to be completed this shift. Pt w/ increased anxiety in relation to possible transfer, MD aware. PRNs given for pain, nausea and anxiety, see MAR. Parents at bedside help w/ reorientation and emotional support. Falls, safety, VTE precautions in place. No additional adverse events.    Problem: Adult Inpatient Plan of Care  Goal: Plan of Care Review  Outcome: Progressing  Goal: Patient-Specific Goal (Individualized)  Outcome: Progressing  Goal: Absence of Hospital-Acquired Illness or Injury  Outcome: Progressing  Intervention: Identify and Manage Fall Risk  Recent Flowsheet Documentation  Taken 10/24/2022 0800 by Nanetta Batty, RN  Safety Interventions:   commode/urinal/bedpan at bedside   fall reduction program maintained   low bed   nonskid shoes/slippers when out of bed  Intervention: Prevent and Manage VTE (Venous Thromboembolism) Risk  Recent Flowsheet Documentation  Taken 10/24/2022 1800 by Nanetta Batty, RN  Anti-Embolism Intervention: (SQ Lovenox) Other (Comment)  Taken 10/24/2022 1700 by Nanetta Batty, RN  VTE Prevention/Management: anticoagulant therapy  Anti-Embolism Intervention: (SQ Lovenox) Other (Comment)  Taken 10/24/2022 1600 by Nanetta Batty, RN  Anti-Embolism Intervention: (SQ Lovenox) Other (Comment)  Taken 10/24/2022 1400 by Nanetta Batty, RN  Anti-Embolism Intervention: (SQ Lovenox) Other (Comment)  Taken 10/24/2022 1230 by Nanetta Batty, RN  VTE Prevention/Management: anticoagulant therapy  Anti-Embolism Intervention: (SQ Lovenox) Other (Comment)  Taken 10/24/2022 1000 by Nanetta Batty, RN  Anti-Embolism Intervention: (SQ Lovenox) Other (Comment)  Taken 10/24/2022 0800 by Nanetta Batty, RN  VTE Prevention/Management: anticoagulant therapy  Anti-Embolism Intervention: (SQ Lovenox) Other (Comment)  Intervention: Prevent Infection  Recent Flowsheet Documentation  Taken 10/24/2022 0800 by Nanetta Batty, RN  Infection Prevention:   hand hygiene promoted   rest/sleep promoted  Goal: Optimal Comfort and Wellbeing  Outcome: Progressing  Goal: Readiness for Transition of Care  Outcome: Progressing  Goal: Rounds/Family Conference  Outcome: Progressing

## 2022-10-25 NOTE — Unmapped (Signed)
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 She was put back on TPN and remained admitted due to abdominal pain and debilitating muscle spasms/rigidity of uncertain etiology. Hospital course now further complicated by bacteremia/fungemia.     GNR bacteremia/Candida fungemia 8/13 : Source now presumably her central line that she was getting TPN through. She is no longer having fevers and is clinically slowly improving . Central line exchanged 8/14  - Consulted ID appreciate recs  - Follow-up repeat cultures  - Continue cefepime and micafungin  - TTE abnormal so awaiting TEE under general anesthesia on Monday     Chronic abdominal pain, nausea 2/2 desmoid tumors and prior surgeries: Continues to have significant nausea with inadequate intake  - Continue scheduled bowel regimen  -Will preferably remove NG tube although given her intake over the past few days we will need to start tube feeds. Can hopefully remove in coming days once nutrition adequate  - Pain control  - Encouraging her to limit opiates as much as possible     Muscle rigidity, increased tone and spasms :Occurred post-procedurally, with reassuring evaluation (MRI spine, neuro c/s, CK normal) at that time. Increased tone seems to worsen during acute stress to her body (anesthesia, infection). Increased baclofen does not seem to be helping much. MRI brain normal   - Appreciate chronic pain and PM&R: increased baclofen to 20  -Neurology considering EMG  - Baclofen 20 TID  - Track down HARD boots in PM&R note, ask PT/OT to help?     Elevated LFTs, intermittent: LFTs continue to fluctuate.  Unclear etiology and previously thought related to TPN versus chemotherapy although is now not on either.  -Continue to monitor daily  -Will consider expanded workup if persistent rise      Iron deficiency anemia: Ferritin 5, iron saturation 6%. Has had prior reactions with numerous attempts to give IV iron.   - S/p Feraheme 1020 mg. Received pre-med protocol per A&I  - Would not give IV iron except when it cannot be avoided given severe itching/stress it causes her     Headaches: Minimal improvement with IV fluids, magnesium, IV Benadryl. Concerning for rebound/medication overuse headache. Headache has improved with Tylenol holiday and ketamine though not completely resolved. Though her chemotherapy also has headaches   - Enteral PRN tylenol, fioricet daily prn could be considered if remains severe  - Avoid NSAIDs, avoid IV benadryl     Desmoid tumors - [name]: Restarting nirogacestat at lower dose of 50 mg BID on 8/8 after discussion with primary oncologist Dr. [name]. On hold in setting of infections.  - Patient/family state he offered to take her to [facility] when she is floor appropriate. Will need to reach out/clarify plans with their team.      SIBO: Hold home cefdinir, doxy    VTE PPX: Lovenox    Disposition: Skilled nursing/AIR  Medically Clear: No    I personally spent greater than 55 minutes face-to-face and non-face-to-face in the care of this patient, which includes all pre, intra, and post visit time on the date of service.  All documented time was specific to the E/M visit and does not include any procedures that may have been performed.  __________________________________________________________________    Subjective:    Continues to have  very poor intake overnight, ongoing nausea and headaches and overall general fatigue    Objective    Temp:  [36.4 ??C (97.5 ??F)-36.9 ??C (98.4 ??F)] 36.7 ??C (98.1 ??F)  Heart Rate:  [72-113] 72  SpO2 Pulse:  [73-98] 73  Resp:  [11-23] 11  BP: (104-130)/(55-83) 104/55  SpO2:  [99 %-100 %] 100 %    Chronically ill-appearing young female sitting upright in bed appearing tired  Moist mucous membranes, nasal gastric tube in place  Heart rate and rhythm normal, no murmurs  Lungs clear to auscultation bilaterally  Abdomen soft, nontender, slightly distended  No lower extremity edema    Recent Results (from the past 24 hour(s))   Basic Metabolic Panel    Collection Time: 10/25/22  6:10 AM   Result Value Ref Range    Sodium 142 135 - 145 mmol/L    Potassium 3.7 3.4 - 4.8 mmol/L    Chloride 108 (H) 98 - 107 mmol/L    CO2 26.0 20.0 - 31.0 mmol/L    Anion Gap 8 5 - 14 mmol/L    BUN 6 (L) 9 - 23 mg/dL    Creatinine 1.61 (L) 0.55 - 1.02 mg/dL    BUN/Creatinine Ratio 13     eGFR CKD-EPI (2021) Female >90 >=60 mL/min/1.66m2    Glucose 94 70 - 179 mg/dL    Calcium 9.0 8.7 - 09.6 mg/dL   Magnesium Level    Collection Time: 10/25/22  6:10 AM   Result Value Ref Range    Magnesium 1.7 1.6 - 2.6 mg/dL   Phosphorus Level    Collection Time: 10/25/22  6:10 AM   Result Value Ref Range    Phosphorus 3.7 2.4 - 5.1 mg/dL   CBC    Collection Time: 10/25/22  6:10 AM   Result Value Ref Range    WBC 4.4 3.6 - 11.2 10*9/L    RBC 3.37 (L) 3.95 - 5.13 10*12/L    HGB 9.0 (L) 11.3 - 14.9 g/dL    HCT 04.5 (L) 40.9 - 44.0 %    MCV 81.0 77.6 - 95.7 fL    MCH 26.7 25.9 - 32.4 pg    MCHC 33.0 32.0 - 36.0 g/dL    RDW 81.1 91.4 - 78.2 %    MPV 10.7 6.8 - 10.7 fL    Platelet 139 (L) 150 - 450 10*9/L   Hepatic Function Panel    Collection Time: 10/25/22  6:10 AM   Result Value Ref Range    Albumin 3.1 (L) 3.4 - 5.0 g/dL    Total Protein 6.1 5.7 - 8.2 g/dL    Total Bilirubin 0.3 0.3 - 1.2 mg/dL    Bilirubin, Direct 9.56 0.00 - 0.30 mg/dL    AST 213 (H) <=08 U/L    ALT 371 (H) 10 - 49 U/L    Alkaline Phosphatase 363 (H) 46 - 116 U/L     Labs/Studies:  Labs and Studies from the last 24hrs per EMR and Reviewed      Principal Problem:    Other acute postprocedural pain  Active Problems:    [name]    Intestinal polyps    [name]    Neoplasm related pain    Physical deconditioning    History of colectomy    Intractable abdominal pain    Generalized anxiety disorder with panic attacks    SBO (small bowel obstruction) (CMS-HCC)    Small intestinal bacterial overgrowth (SIBO)

## 2022-10-26 LAB — MAGNESIUM: MAGNESIUM: 1.6 mg/dL (ref 1.6–2.6)

## 2022-10-26 LAB — ADDON DIFFERENTIAL ONLY
BASOPHILS ABSOLUTE COUNT: 0 10*9/L (ref 0.0–0.1)
BASOPHILS RELATIVE PERCENT: 0.6 %
EOSINOPHILS ABSOLUTE COUNT: 0.3 10*9/L (ref 0.0–0.5)
EOSINOPHILS RELATIVE PERCENT: 5.7 %
LYMPHOCYTES ABSOLUTE COUNT: 1.7 10*9/L (ref 1.1–3.6)
LYMPHOCYTES RELATIVE PERCENT: 30.3 %
MONOCYTES ABSOLUTE COUNT: 0.6 10*9/L (ref 0.3–0.8)
MONOCYTES RELATIVE PERCENT: 11 %
NEUTROPHILS ABSOLUTE COUNT: 2.9 10*9/L (ref 1.8–7.8)
NEUTROPHILS RELATIVE PERCENT: 52.4 %

## 2022-10-26 LAB — CBC
HEMATOCRIT: 26.9 % — ABNORMAL LOW (ref 34.0–44.0)
HEMOGLOBIN: 8.9 g/dL — ABNORMAL LOW (ref 11.3–14.9)
MEAN CORPUSCULAR HEMOGLOBIN CONC: 33.1 g/dL (ref 32.0–36.0)
MEAN CORPUSCULAR HEMOGLOBIN: 26.3 pg (ref 25.9–32.4)
MEAN CORPUSCULAR VOLUME: 79.5 fL (ref 77.6–95.7)
MEAN PLATELET VOLUME: 10.3 fL (ref 6.8–10.7)
PLATELET COUNT: 157 10*9/L (ref 150–450)
RED BLOOD CELL COUNT: 3.39 10*12/L — ABNORMAL LOW (ref 3.95–5.13)
RED CELL DISTRIBUTION WIDTH: 14.4 % (ref 12.2–15.2)
WBC ADJUSTED: 5.5 10*9/L (ref 3.6–11.2)

## 2022-10-26 LAB — HEPATIC FUNCTION PANEL
ALBUMIN: 3 g/dL — ABNORMAL LOW (ref 3.4–5.0)
ALKALINE PHOSPHATASE: 342 U/L — ABNORMAL HIGH (ref 46–116)
ALT (SGPT): 289 U/L — ABNORMAL HIGH (ref 10–49)
AST (SGOT): 134 U/L — ABNORMAL HIGH (ref <=34)
BILIRUBIN DIRECT: 0.1 mg/dL (ref 0.00–0.30)
BILIRUBIN TOTAL: 0.3 mg/dL (ref 0.3–1.2)
PROTEIN TOTAL: 5.9 g/dL (ref 5.7–8.2)

## 2022-10-26 LAB — BASIC METABOLIC PANEL
ANION GAP: 9 mmol/L (ref 5–14)
BLOOD UREA NITROGEN: <5 mg/dL — ABNORMAL LOW (ref 9–23)
CALCIUM: 9.1 mg/dL (ref 8.7–10.4)
CHLORIDE: 107 mmol/L (ref 98–107)
CO2: 27 mmol/L (ref 20.0–31.0)
CREATININE: 0.4 mg/dL — ABNORMAL LOW
EGFR CKD-EPI (2021) FEMALE: >90 mL/min/{1.73_m2} (ref >=60)
GLUCOSE RANDOM: 82 mg/dL (ref 70–179)
POTASSIUM: 3.7 mmol/L (ref 3.4–4.8)
SODIUM: 143 mmol/L (ref 135–145)

## 2022-10-26 LAB — PHOSPHORUS: PHOSPHORUS: 4.7 mg/dL (ref 2.4–5.1)

## 2022-10-26 MED ADMIN — cefepime (MAXIPIME) 2 g in sodium chloride 0.9 % (NS) 100 mL IVPB-MBP: 2 g | INTRAVENOUS | Stop: 2022-10-28

## 2022-10-26 MED ADMIN — baclofen (LIORESAL) tablet 20 mg: 20 mg | ORAL | @ 14:00:00

## 2022-10-26 MED ADMIN — lactated Ringers infusion: 100 mL/h | INTRAVENOUS | @ 06:00:00

## 2022-10-26 MED ADMIN — bisacodyl (DULCOLAX) suppository 10 mg: 10 mg | RECTAL | @ 14:00:00

## 2022-10-26 MED ADMIN — LORazepam (ATIVAN) injection 1 mg: 1 mg | INTRAVENOUS | @ 22:00:00

## 2022-10-26 MED ADMIN — HYDROmorphone (PF) (DILAUDID) injection 1 mg: 1 mg | INTRAVENOUS | @ 01:00:00 | Stop: 2022-11-04

## 2022-10-26 MED ADMIN — ondansetron (ZOFRAN) injection 4 mg: 4 mg | INTRAVENOUS | @ 19:00:00

## 2022-10-26 MED ADMIN — buprenorphine-naloxone (SUBOXONE) 2-0.5 mg SL film 0.5 mg of buprenorphine: .25 | SUBLINGUAL | @ 14:00:00

## 2022-10-26 MED ADMIN — naloxegol (MOVANTIK) tablet 25 mg: 25 mg | ORAL | @ 14:00:00

## 2022-10-26 MED ADMIN — ondansetron (ZOFRAN) injection 4 mg: 4 mg | INTRAVENOUS | @ 02:00:00

## 2022-10-26 MED ADMIN — HYDROmorphone (PF) (DILAUDID) injection 1 mg: 1 mg | INTRAVENOUS | @ 14:00:00 | Stop: 2022-11-04

## 2022-10-26 MED ADMIN — micafungin (MYCAMINE) 100 mg in sodium chloride 0.9 % (NS) 100 mL IVPB-MBP: 100 mg | INTRAVENOUS | @ 02:00:00 | Stop: 2022-11-05

## 2022-10-26 MED ADMIN — baclofen (LIORESAL) tablet 20 mg: 20 mg | ORAL | @ 02:00:00

## 2022-10-26 MED ADMIN — LORazepam (ATIVAN) injection 1 mg: 1 mg | INTRAVENOUS | @ 11:00:00

## 2022-10-26 MED ADMIN — cefepime (MAXIPIME) 2 g in sodium chloride 0.9 % (NS) 100 mL IVPB-MBP: 2 g | INTRAVENOUS | @ 16:00:00 | Stop: 2022-10-28

## 2022-10-26 MED ADMIN — promethazine (PHENERGAN) 6.5 mg in sodium chloride (NS) 0.9 % 50 mL IVPB: 6.5 mg | INTRAVENOUS | @ 06:00:00

## 2022-10-26 MED ADMIN — pantoprazole (Protonix) EC tablet 40 mg: 40 mg | ORAL | @ 14:00:00

## 2022-10-26 MED ADMIN — cefepime (MAXIPIME) 2 g in sodium chloride 0.9 % (NS) 100 mL IVPB-MBP: 2 g | INTRAVENOUS | @ 09:00:00 | Stop: 2022-10-28

## 2022-10-26 MED ADMIN — cetirizine (ZYRTEC) tablet 10 mg: 10 mg | GASTROENTERAL | @ 14:00:00

## 2022-10-26 MED ADMIN — enoxaparin (LOVENOX) syringe 40 mg: 40 mg | SUBCUTANEOUS | @ 02:00:00

## 2022-10-26 MED ADMIN — HYDROmorphone (PF) (DILAUDID) injection 1 mg: 1 mg | INTRAVENOUS | @ 04:00:00 | Stop: 2022-11-04

## 2022-10-26 MED ADMIN — pantoprazole (Protonix) EC tablet 40 mg: 40 mg | ORAL | @ 02:00:00

## 2022-10-26 MED ADMIN — famotidine (PEPCID) tablet 20 mg: 20 mg | ORAL | @ 02:00:00

## 2022-10-26 MED ADMIN — HYDROmorphone (PF) (DILAUDID) injection 1 mg: 1 mg | INTRAVENOUS | @ 09:00:00 | Stop: 2022-11-04

## 2022-10-26 MED ADMIN — polyethylene glycol (MIRALAX) packet 17 g: 17 g | ORAL | @ 14:00:00

## 2022-10-26 MED ADMIN — HYDROmorphone (PF) (DILAUDID) injection 1 mg: 1 mg | INTRAVENOUS | @ 19:00:00 | Stop: 2022-11-04

## 2022-10-26 MED ADMIN — buprenorphine-naloxone (SUBOXONE) 2-0.5 mg SL film 0.5 mg of buprenorphine: .25 | SUBLINGUAL | @ 19:00:00

## 2022-10-26 MED ADMIN — lactated Ringers infusion: 100 mL/h | INTRAVENOUS | @ 15:00:00

## 2022-10-26 MED ADMIN — promethazine (PHENERGAN) 6.5 mg in sodium chloride (NS) 0.9 % 50 mL IVPB: 6.5 mg | INTRAVENOUS | @ 15:00:00

## 2022-10-26 MED ADMIN — senna (SENOKOT) tablet 2 tablet: 2 | ORAL | @ 02:00:00

## 2022-10-26 MED ADMIN — baclofen (LIORESAL) tablet 20 mg: 20 mg | ORAL | @ 19:00:00

## 2022-10-26 MED ADMIN — buprenorphine-naloxone (SUBOXONE) 2-0.5 mg SL film 0.5 mg of buprenorphine: .25 | SUBLINGUAL | @ 02:00:00

## 2022-10-26 NOTE — Unmapped (Incomplete)
Problem: Adult Inpatient Plan of Care  Goal: Plan of Care Review  Outcome: Progressing  Goal: Patient-Specific Goal (Individualized)  Outcome: Progressing  Goal: Absence of Hospital-Acquired Illness or Injury  Outcome: Progressing  Intervention: Identify and Manage Fall Risk  Recent Flowsheet Documentation  Taken 10/25/2022 0800 by Retia Passe, RN  Safety Interventions:   aspiration precautions   fall reduction program maintained   infection management   lighting adjusted for tasks/safety   low bed   nonskid shoes/slippers when out of bed   enteral feeding safety   family at bedside  Intervention: Prevent Infection  Recent Flowsheet Documentation  Taken 10/25/2022 0800 by Retia Passe, RN  Infection Prevention: cohorting utilized  Goal: Optimal Comfort and Wellbeing  Outcome: Progressing  Goal: Readiness for Transition of Care  Outcome: Progressing  Goal: Rounds/Family Conference  Outcome: Progressing     Problem: Latex Allergy  Goal: Absence of Allergy Symptoms  Outcome: Progressing     Problem: Fall Injury Risk  Goal: Absence of Fall and Fall-Related Injury  Outcome: Progressing  Intervention: Promote Injury-Free Environment  Recent Flowsheet Documentation  Taken 10/25/2022 0800 by Retia Passe, RN  Safety Interventions:   aspiration precautions   fall reduction program maintained   infection management   lighting adjusted for tasks/safety   low bed   nonskid shoes/slippers when out of bed   enteral feeding safety   family at bedside     Problem: Skin Injury Risk Increased  Goal: Skin Health and Integrity  Outcome: Progressing     Problem: Self-Care Deficit  Goal: Improved Ability to Complete Activities of Daily Living  Outcome: Progressing     Problem: Comorbidity Management  Goal: Blood Glucose Levels Within Targeted Range  Outcome: Progressing  Goal: Blood Pressure in Desired Range  Outcome: Progressing     Problem: Nausea and Vomiting  Goal: Nausea and Vomiting Relief  Outcome: Progressing     Problem: Infection  Goal: Absence of Infection Signs and Symptoms  Outcome: Progressing  Intervention: Prevent or Manage Infection  Recent Flowsheet Documentation  Taken 10/25/2022 0800 by Retia Passe, RN  Infection Management: aseptic technique maintained     Problem: Pain Acute  Goal: Optimal Pain Control and Function  Outcome: Progressing     Problem: Malnutrition  Goal: Improved Nutritional Intake  Outcome: Progressing

## 2022-10-26 NOTE — Unmapped (Signed)
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 She was put back on TPN and remained admitted due to abdominal pain and debilitating muscle spasms/rigidity of uncertain etiology. Hospital course now further complicated by bacteremia/fungemia.     GNR bacteremia/Candida fungemia 8/13 : Source now presumably her central line that she was getting TPN through. She is no longer having fevers and is clinically slowly improving . Central line exchanged 8/14  - Consulted ID appreciate recs  - Follow-up repeat cultures  - Continue cefepime and micafungin  - TTE abnormal so awaiting TEE under general anesthesia on Monday     Chronic abdominal pain, nausea 2/2 desmoid tumors and prior surgeries: Continues to have significant nausea with inadequate intake  - Continue scheduled bowel regimen  -Will preferably remove NG tube although given her intake over the past few days we will need to start tube feeds. Can hopefully remove in coming days once nutrition adequate  - Pain control  - Encouraging her to limit opiates as much as possible     Muscle rigidity, increased tone and spasms :Occurred post-procedurally, with reassuring evaluation (MRI spine, neuro c/s, CK normal) at that time. Increased tone seems to worsen during acute stress to her body (anesthesia, infection). Increased baclofen does not seem to be helping much. MRI brain normal   - Appreciate chronic pain and PM&R: increased baclofen to 20  -Neurology considering EMG  - Baclofen 20 TID  - Track down HARD boots in PM&R note, ask PT/OT to help?     Elevated LFTs, intermittent: LFTs continue to fluctuate.  Unclear etiology and previously thought related to TPN versus chemotherapy although is now not on either.  -Continue to monitor daily  -Will consider expanded workup if persistent rise      Iron deficiency anemia: Ferritin 5, iron saturation 6%. Has had prior reactions with numerous attempts to give IV iron.   - S/p Feraheme 1020 mg. Received pre-med protocol per A&I  - Would not give IV iron except when it cannot be avoided given severe itching/stress it causes her     Headaches: Minimal improvement with IV fluids, magnesium, IV Benadryl. Concerning for rebound/medication overuse headache. Headache has improved with Tylenol holiday and ketamine though not completely resolved. Though her chemotherapy also has headaches   - Enteral PRN tylenol, fioricet daily prn could be considered if remains severe  - Avoid NSAIDs, avoid IV benadryl     Desmoid tumors - [name]: Restarting nirogacestat at lower dose of 50 mg BID on 8/8 after discussion with primary oncologist Dr. [name]. On hold in setting of infections.  - Patient/family state he offered to take her to [facility] when she is floor appropriate. Will need to reach out/clarify plans with their team.      SIBO: Hold home cefdinir, doxy    VTE PPX: Lovenox    Disposition: Skilled nursing/AIR  Medically Clear: No    I personally spent greater than 55 minutes face-to-face and non-face-to-face in the care of this patient, which includes all pre, intra, and post visit time on the date of service.  All documented time was specific to the E/M visit and does not include any procedures that may have been performed.  __________________________________________________________________    Subjective:    Overnight ongoing nausea  and pain. Tube feeds were not started yesterday although clarified with nursing and started this morning. We discussed transferring to floor today.     Objective    Temp:  [36.7 ??C (98.1 ??F)-36.9 ??C (98.4 ??F)] 36.8 ??C (98.2 ??F)  Heart Rate:  [71-106] 78  SpO2 Pulse:  [73-90] 78  Resp:  [11-14] 12  BP: (108-124)/(53-72) 118/72  SpO2:  [99 %-100 %] 99 %    Chronically ill-appearing young female sitting upright in bed appearing tired, NAD  Moist mucous membranes, nasal gastric tube in place  Heart rate and rhythm normal, no murmurs  Lungs clear to auscultation bilaterally  Abdomen soft, nontender, slightly distended  No lower extremity edema    Recent Results (from the past 24 hour(s))   Basic Metabolic Panel    Collection Time: 10/26/22  6:26 AM   Result Value Ref Range    Sodium 143 135 - 145 mmol/L    Potassium 3.7 3.4 - 4.8 mmol/L    Chloride 107 98 - 107 mmol/L    CO2 27.0 20.0 - 31.0 mmol/L    Anion Gap 9 5 - 14 mmol/L    BUN <5 (L) 9 - 23 mg/dL    Creatinine 2.13 (L) 0.55 - 1.02 mg/dL    eGFR CKD-EPI (0865) Female >90 >=60 mL/min/1.68m2    Glucose 82 70 - 179 mg/dL    Calcium 9.1 8.7 - 78.4 mg/dL   Magnesium Level    Collection Time: 10/26/22  6:26 AM   Result Value Ref Range    Magnesium 1.6 1.6 - 2.6 mg/dL   Phosphorus Level    Collection Time: 10/26/22  6:26 AM   Result Value Ref Range    Phosphorus 4.7 2.4 - 5.1 mg/dL   CBC    Collection Time: 10/26/22  6:26 AM   Result Value Ref Range    WBC 5.5 3.6 - 11.2 10*9/L    RBC 3.39 (L) 3.95 - 5.13 10*12/L    HGB 8.9 (L) 11.3 - 14.9 g/dL    HCT 69.6 (L) 29.5 - 44.0 %    MCV 79.5 77.6 - 95.7 fL    MCH 26.3 25.9 - 32.4 pg    MCHC 33.1 32.0 - 36.0 g/dL    RDW 28.4 13.2 - 44.0 %    MPV 10.3 6.8 - 10.7 fL    Platelet 157 150 - 450 10*9/L   Hepatic Function Panel    Collection Time: 10/26/22  6:26 AM   Result Value Ref Range    Albumin 3.0 (L) 3.4 - 5.0 g/dL    Total Protein 5.9 5.7 - 8.2 g/dL    Total Bilirubin 0.3 0.3 - 1.2 mg/dL    Bilirubin, Direct 1.02 0.00 - 0.30 mg/dL    AST 725 (H) <=36 U/L    ALT 289 (H) 10 - 49 U/L    Alkaline Phosphatase 342 (H) 46 - 116 U/L   Addon Differential Only    Collection Time: 10/26/22  6:26 AM   Result Value Ref Range    Neutrophils % 52.4 %    Lymphocytes % 30.3 %    Monocytes % 11.0 %    Eosinophils % 5.7 %    Basophils % 0.6 %    Absolute Neutrophils 2.9 1.8 - 7.8 10*9/L    Absolute Lymphocytes 1.7 1.1 - 3.6 10*9/L    Absolute Monocytes 0.6 0.3 - 0.8 10*9/L    Absolute Eosinophils 0.3 0.0 - 0.5 10*9/L    Absolute Basophils  0.0 0.0 - 0.1 10*9/L    Microcytosis Slight (A) Not Present    Hypochromasia Slight (A) Not Present     Labs/Studies:  Labs and Studies from the last 24hrs per EMR and Reviewed      Principal Problem:    Other acute postprocedural pain  Active Problems:    [name]    Intestinal polyps    [name]    Neoplasm related pain    Physical deconditioning    History of colectomy    Intractable abdominal pain    Generalized anxiety disorder with panic attacks    SBO (small bowel obstruction) (CMS-HCC)    Small intestinal bacterial overgrowth (SIBO)

## 2022-10-26 NOTE — Unmapped (Signed)
Division of Infectious Diseases  General Inpatient Consultation Service     For questions about this consult, page [phone] (Gen A Follow-up Pager).    [name] [name] is being seen in consultation at the request of [name] Furl, MD for evaluation and management of candida fungemia and GNR bacteremia.     PLAN FOR 10/25/2022    Diagnostic    Follow up blood cultures sent 8/13, 8/17 to completion  Follow up TEE  Monitor for antimicrobial toxicity with the following:  CBC w/diff at least once per week  clinical assessments for rashes or other skin changes  CMP daily for now    Treatment  Continue Cefepime, anti-pseudomonal dosing per pharmacy  Continue on Micafungin 100mg  daily  Duration of therapy = TBD  start date = 10/21/22  end date = TBD    I discussed the plans for today with family/caregiver(s) and patient on 10/25/2022.    Our service will continue to follow.    I personally spent 30 mins face-to-face and non-face-to-face in the care of this patient on 10/25/2022, which includes all pre, intra, and post visit time on the date of service.  All documented time was specific to the E/M visit and does not include any procedures that may have been performed.    [name] [name], MBBS  [facility] Division of Infectious Diseases                MDM and Problem-Specific Assessments  ( .00ID2DAY  /  .[id number]  /  .IDSS  / .[name] [name] )     22 y.o. 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 Subsequently found to have Candidemia and GNR bacteremia, likely due to TPN/CVC, now s/p RIJ removal on 8/14. TTE with possible vegetations in right atrium, awaiting TEE under general anesthesia. With fluctuating LFTs of unclear etiology, monitoring closely for now, if they jump significantly may adjust antimicrobial regimen.    October 24, 2022   Patient has: []  acute illness w/systemic sxs  [mod] [x]  illness posing risk to life or function  [high]   I reviewed:   (3+) [x]  primary team note [x]  consultant note(s) []  procedure/op note(s) [x]  micro result(s)    [x]  CBC results [x]  chemistry results []  radiology report(s) []  nursing note(s)   I independently visualized:   (any)   []  cxs/plates in lab []  plain film images []  CT images []  PET images    []  path slide(s) []  ECG tracing []  MRI images []  nuclear scan   I discussed: (any) []  micro and/or path w/lab personnel []  drug options and/or interactions w/ID pharmD    []  procedure/OR findings w/other MD(s) []  echo and/or imaging w/other MD(s)    [x]  mgm't w/attending(s) involved in case []  setting up home abx w/OPAT team   Mgm't requires: []  prescription drug(s)  [mod] [x]  intensive toxicity monitoring  [high]     Interval notes & result reports show: Remains afebrile and hemodynamically stable. TEE aborted due to sedation issues, will have to be done under general anesthesia    Patient reports: Diffuse body pain, fatigue, difficulty tasting foods, persistent nausea    My interpretation of the data I reviewed is: AST/ALT elevated to 427/371. WBC normal at 4.4. Cr stable at 0.45    # GNR bacteremia  -    ACUTE = LESS THAN  1 YEAR DURATION  # Yeast in blood Cx  Patient admitted since 7/18 with course c/b worsening RLQ abdominal pain in setting of new recurrent fevers and tachycardia. Of note, R IJ for TPN present since 01/2022. Blood cultures on 8/13 grew 1/4 GNR, lactose fermenter, family Enterobacteriaceae, blood culture also growing yeast (Candida albicans).  TTE (8/15) demonstrated mobile echodensity in right atrium, cannot rule out vegetation. Plan is for TEE for additional evaluation. With fluctuating LFTs, unclear etiology, for now continuing cefepime and micafungin and monitoring closely  Management is as per the blue box above.    # Background of Gardner's syndrome, desmoid tumors-  ACUTE = LESS THAN 1 YEAR DURATION  Diagnosed around the age of 1.  Nirogacestat currently on hold.    # Circumferential bladder wall thickening on CT from 10/22/2022 -    ACUTE = LESS THAN 1 YEAR DURATION  Ua from 10/22/2022 positive.  Management is as per the blue box above.    # Muscle rigidity, increased tone and spasms   -    ACUTE = LESS THAN 1 YEAR DURATION  09/25/2022 had injection of desmoid tumors with steroid/anesthetic under US guidance. For desmoid tumors in right forearm and left chest wall. Had multiple injections like these in the past without complications.  Admitted on 7.19 for observations , her workup included MRI spine and CK which were unremarkable.  Management is as per the blue box above.    # Management of prescription antimicrobials needing intensive toxicity monitoring - acute, poses threat to life or bodily function  [high]  Cefepime can cause various types of neurotoxicity, including depressed consciousness, encephalopathy, aphasia, myoclonus, seizures, and coma. Echinocandin antigfungals can cause phlebitis, skin rashes, N/V/D, hepatotoxicity, neutropenia, thrombocytopenia, and/or infusion-related flushing, pruritis, or fever.   See recommendations in blue box above.    # Disposition  TBD            Antimicrobials & Other Medications  ( .00IDGANTT  /  .00IDGANTTLIST  )     Current  Cefepime 8/13-    Micafungin 8/14-    Previous  Metronidazole 8/13-8/16  Vancomycin 8/13-8/15    Immunomodulators and antipyretics  None       Current Medications as of 10/25/2022  Scheduled  PRN   baclofen, 20 mg, TID  bisacodyl, 10 mg, BID  buprenorphine-naloxone, 0.25 Film, TID  cefepime, 2 g, Q8H  cetirizine, 10 mg, Daily  enoxaparin (LOVENOX) injection, 40 mg, Q24H SCH  famotidine, 20 mg, Nightly  micafungin, 100 mg, Q24H  mirtazapine, 15 mg, Nightly  naloxegol, 25 mg, Daily  [Provider Hold] nirogacestat, 50 mg, BID  pantoprazole, 40 mg, BID  polyethylene glycol, 17 g, BID  senna, 2 tablet, Nightly      acetaminophen, 650 mg, Q6H PRN  aluminum-magnesium hydroxide-simethicone, 60 mL, Q6H PRN  calcium carbonate, 400 mg elem calcium, Daily PRN  Chemo Clarification Order, , Continuous PRN  guaiFENesin, 200 mg, Q4H PRN  HYDROmorphone, 1 mg, Q3H PRN  IP okay to treat, , Continuous PRN  lidocaine, , TID PRN  LORazepam, 1 mg, BID PRN  LORazepam, 0.5 mg, TID PRN  lidocaine-diphenhydrAMINE-aluminum-magnesium, 10 mL, QID PRN  ondansetron, 4 mg, Q12H PRN  oxyCODONE, 10 mg, Q4H PRN  phenol, 2 spray, Q2H PRN  polyethylene glycol, 17 g, Daily PRN  promethazine, 6.5 mg, Q6H PRN  pseudoephedrine, 30 mg, Daily PRN  zinc oxide-cod liver oil, , Daily PRN           Physical Exam  Temp:  [36.4 ??C (97.5 ??F)-36.9 ??C (98.4 ??F)] 36.7 ??C (98.1 ??F)  Heart Rate:  [72-113] 72  SpO2 Pulse:  [73] 73  Resp:  [11-23] 11  BP: (104-130)/(55-83) 104/55  MAP (mmHg):  [69-93] 69  SpO2:  [99 %-100 %] 100 %    Actual body weight: 60.2 kg (132 lb 11.5 oz)  Ideal body weight: 57 kg (125 lb 10.6 oz)  Adjusted ideal body weight: 58.3 kg (128 lb 7.7 oz)      Const [x]  vital signs above      []  WDWN, NAD, non-toxic appearance  [x]  Uncomfortable appearing, NAD      Eyes   []  Lids normal bilaterally, conjunctiva anicteric and noninjected OU  []  PERRL   []        ENMT     [x]  Normal appearance of external nose and ears       [x]  No visible thrush  []  MMM, no lesions on lips or gums, dentition good        []  Hearing normal   [x]  NG tube in place.      Neck    []  Neck of normal appearance and trachea midline        []  No thyromegaly, nodules, or tenderness   [x]  RIJ central line in place      Lymph    []  No LAD in neck       []  No LAD in supraclavicular area       []  No LAD in axillae   []  No LAD in epitrochlear chains       []  No LAD in inguinal areas  []        CV    [x]  RRR, no m/r/g, S1/S2       []  No peripheral edema, WWP       []  Pedal pulses intact   []        Resp   [x]  Normal WOB       []  CTAB   []        GI   []  Normal inspection, NTND, NABS       []  No umbilical hernia on exam       []  No hepatosplenomegaly       []  Inspection of perineal and perianal areas normal  [x]  Diffusely tender to palpation      GU   []  Normal external genitalia       []        MSK   []  No clubbing or cyanosis of hands       []  No focal tenderness or abnormalities on palpation of joints in RUE, LUE, RLE, or LLE  []        Skin   [x]  No rashes, lesions, or ulcers of visualized skin       [x]  Skin warm and dry to palpation   []        Neuro   []  CNs II-XII grossly intact       []  Sensation to light touch grossly intact throughout   []  DTRs normal and symmetric throughout   []  Unable to assess due to critical illness, sedation, or mental status  []        Psych   [x]  Appropriate affect      []  Oriented to person, place, time      [x]  Judgment and insight are appropriate   []  Unable to assess due to critical illness, sedation, or mental status  []   Patient Lines/Drains/Airways Status       Active Active Lines, Drains, & Airways       Name Placement date Placement time Site Days    CVC Triple Lumen 10/22/22 Non-tunneled Right Internal jugular 10/22/22  1521  Internal jugular  3    NG/OG Tube Decompression Right nostril 09/26/22  1000  Right nostril  29                      Data for ID Decision Making  ( IDGENCONMDM )       Micro & Serological Data   ( RSLTMICRO  /  [id number]  /  00CXSRC  /  00CXRES  /  00CXSUSC )    Microbiology Results (last day)       Procedure Component Value Date/Time Date/Time    Blood Culture #2 [[id number]]  (Normal) Collected: 10/21/22 0714    Lab Status: Preliminary result Specimen: Blood from 1 Peripheral Draw Updated: 10/24/22 0745     Blood Culture, Routine No Growth at 72 hours    Blood Culture #1 [[id number]]  (Normal) Collected: 10/21/22 1814    Lab Status: Preliminary result Specimen: Blood from 1 Peripheral Draw Updated: 10/23/22 1845     Blood Culture, Routine No Growth at 48 hours    Blood Culture #2 [[id number]]  (Normal) Collected: 10/21/22 1815    Lab Status: Preliminary result Specimen: Blood from 1 Peripheral Draw Updated: 10/23/22 1845     Blood Culture, Routine No Growth at 48 hours    Blood Culture #1 [[id number]]  (Abnormal)  (Susceptibility) Collected: 10/21/22 0715    Lab Status: Preliminary result Specimen: Blood from 1 Peripheral Draw Updated: 10/23/22 1613     Blood Culture, Routine Gram Negative Rod, Family Enterobacteriaceae     Gram Stain Result Gram negative rods (bacilli)      Yeast    Susceptibility       Gram Negative Rod, Family Enterobacteriaceae (1)       Antibiotic Interpretation Method Status    Ampicillin Resistant KIRBY BAUER Preliminary    Cefazolin Susceptible KIRBY BAUER Preliminary    Ciprofloxacin Susceptible KIRBY BAUER Preliminary    Gentamicin Susceptible KIRBY BAUER Preliminary    Levofloxacin Susceptible KIRBY BAUER Preliminary    Piperacillin + Tazobactam Susceptible KIRBY BAUER Preliminary                       Urine Culture [[id number]] Collected: 10/23/22 1351    Lab Status: In process Specimen: Urine from Clean Catch Updated: 10/23/22 1415              7/19 Bcx NGTD  8/13 Bcx 1/2 GNR, family enterobacteriaceae (R-ampi; S-cefazolin, cipro, gent, levo, pip+tazo)  8/13 Bcx 2/2 NGTD    Recent Studies  ( RISRSLT )    MRI Brain W Wo Contrast    Result Date: 10/24/2022  EXAM: Magnetic resonance imaging, brain, without and with contrast material. ACCESSION: [id number] UN     CLINICAL INDICATION: 22 years old Female with Increasing rigidity/tone and weakness, rule out central process      COMPARISON: MRI brain 09/17/2016     TECHNIQUE: Multiplanar, multisequence MR imaging of the brain was performed without and with I.V. contrast.     FINDINGS:  There is no focal parenchymal signal abnormality. Ventricles are normal in size. There is no midline shift. No extra-axial fluid collection. No evidence of intracranial hemorrhage. No diffusion weighted signal abnormality to suggest acute infarct. Developmental venous  anomaly in the right frontal centrum semiovale.     No mass. There is no abnormal intracranial enhancement. No abnormal enhancement in the left parotid gland on today's exam.         --Normal MRI of the brain.                Initial Consult Documentation from October 22, 2022     Sources of information include: chart review, patient, and family/friend(s).    History of Present Illness:     22 y.o. 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    Patient has been admitted to [facility] since 7/18 with chronic abdominal pain and restarted on TPN in the setting of low PO intake. Patient was noted with chronic abdominal pain and nausea that is secondary to desmoid tumors and prior surgeries. More recently on 8/11, patient was noted with peristalsis in right lower quadrant. She was last noted with bowel movement on 8/11. Since, she has had worsening abdominal pain. Overnight of 8/12, patient was febrile to 38.7 despite tylenol and tachycardic to 140s. Since, patient has been having recurrent fevers and tachycardic with no leukocytosis. CTAP (8/13) was unremarkable for acute processes and demonstrated no small bowel obstruction. Blood cultures on 8/13 grew 1/2 GNR. Patient was started on cefepime, vancomycin, and metronidazole on 8/13. Right IJ was removed on 8/14.    Of note, pouchoscopy on 02/2022 showed 1cm of circumferential ulceration with severe, active pouchitis which improved ~2 months later. Repeat pouchoscopy on 07/16/22 demonstrated small ulcerations on suture line and a rectal cuff beginning 1cm from anal verge and extending 2cm with healthy appearing mucosa. Patient has been on TPN since 09/28/22 through nontunneled right IJ that has been present since 01/2022.    Past Medical History Patient  has a past medical history of Abdominal pain, Acid reflux, Anesthesia complication, Cancer (CMS-HCC), Cataract of right eye, COVID-19 virus infection (01/2019), Cyst of thyroid determined by ultrasound, [name], Difficult intravenous access, [name] (familial adenomatous polyposis), [name], Gastric polyps, History of chemotherapy, History of colon polyps, History of COVID-19 (01/2019), Iron deficiency anemia due to chronic blood loss, PONV (postoperative nausea and vomiting), Rectal bleeding, and Syncopal episodes.      Meds and Allergies  Patient has a current medication list which includes the following prescription(s): lorazepam, promethazine, acetaminophen, baclofen, cefdinir, cholecalciferol (vitamin d3 25 mcg (1,000 units)), clobetasol, diclofenac sodium, doxycycline, famotidine, hydrocortisone, lidocaine, lorazepam, mirtazapine, naloxone, nirogacestat, oxycodone, pimecrolimus, triamcinolone, and zinc oxide, and the following Facility-Administered Medications: acetaminophen, aluminum-magnesium hydroxide-simethicone, baclofen, bisacodyl, buprenorphine-naloxone, calcium carbonate, cefepime (MAXIPIME) 2 g in sodium chloride 0.9 % (NS) 100 mL IVPB-MBP, cetirizine, CHEMO CLARIFICATION ORDER, enoxaparin, famotidine, guaifenesin, hydromorphone (pf), IP OKAY TO TREAT, lactated ringers, lidocaine, lorazepam, lorazepam, micafungin (MYCAMINE) 100 mg in sodium chloride 0.9 % (NS) 100 mL IVPB-MBP, mirtazapine, lidocaine-diphenhydramine-aluminum-magnesium, naloxegol, [Provider Hold] nirogacestat, ondansetron, oxycodone, pantoprazole, phenol, polyethylene glycol, polyethylene glycol, promethazine (PHENERGAN) 6.5 mg in sodium chloride (NS) 0.9 % 50 mL IVPB, pseudoephedrine, senna, sodium chloride, and zinc oxide-cod liver oil.    Allergies: Adhesive tape-silicones; Ferrlecit [sodium ferric gluconat-sucrose]; Levofloxacin; Methylnaltrexone; Neomycin; Papaya; Morphine; Zosyn [piperacillin-tazobactam]; Compazine [prochlorperazine]; Iron analogues; Iron dextran; and Latex, natural rubber       Social History  Patient  reports that she has never smoked. She has been exposed to tobacco smoke.  She has never used smokeless tobacco. She reports that she does not drink alcohol and does not use drugs.

## 2022-10-26 NOTE — Unmapped (Signed)
Note for updated neuro exam 8/17 afternoon        Subjective:  - mom and sister at bedside today -mom reports - history of bad reactions to propofol and possibly other sedation described as shivering.  - 7/18 outpatient steroid injection for RUE for which she was under GA, when awoke, appeared to have whole body shivering and was rigid everywhere. Mom says this continued for hours, despite demerol trials. Was admitted to observation status instead of discharging. At some point in the night she was able to walk with assistance.   - no clear benefit with baclofen this admission, has been increased from 5->20 TID       Exam:  General - no apparent distress, napping initially    Mental status - no concerns  Cranials - intact, chronic mild right eye exotropia  Motor -   Normal tone in BUE. No pronator drift. Slow finger taps on right (chronic)     Inconsistent tone and strength in BLE, with changes in tone over the course of seconds while throttling resistance.    Able to bend knees slightly with difficulty, then when asked to uses her hands to assist she is able to extend the angle of bend.     Direct testing - UE R/L: Delt 5/5, Bi 4/5, HG 4/5 (chronic)  Direct testing - LE R/L: HF 2/3, KF/KE unable to assess, DF 1/1, PF 2/2    Video 8/6 on mom's phone of patient walking with walker, slowly, no e/o foot drop. Compare to DF 1/1 on direct testing.     When walked with walker, stands for a few moments with arms by sides, without relying on walker.     While sister giving pedicure, demonstrates increased ROM at ankles compared to direct testing of ankle ROM on exam.    Ankles and toes stiffly plantarflexed on direct testing, a few minutes later in neutral toe and ankle position.      Sensation -  Intact LT, vibration, and PP in BUE and BLE  Reduced cold sensation below knees bilaterally (50-60% range).  Patch of reduced sensation right 4th and 5th digits (chronic) with + tinel at right elbow.    Reflex - bi 2/1, br 3/1, patella 2/2, aj 2/2, no clonus.hoffman negative, toes downgoing.    Cerebellar - normal FTN bilaterally    Gait - ambulates with walker. Maintains posture for long period of time when rising from sitting to standing and vice versa. Lets go of walker handles and maintains posture without falling. Reports difficulty turning feet and turns feet slowly.        Nigel Bridgeman, MD  PGY-4 Neurology

## 2022-10-26 NOTE — Unmapped (Addendum)
Adult Nutrition Assessment Note    Visit Type: MD Consult  Reason for Visit: Enteral Nutrition    HPI & PMH:  [name] [name] is a 22 y.o. 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     Nutrition Progress: 08/18: Consulted for enteral nutrition per MD. Patient only able to take a few bites of food yesterday and today. Patient with altered taste and nausea. Patient currently receiving Osmolite 1.0 at 20 ml/hr. Last BM x1 08/18    Anthropometric Data:  Height: 165.1 cm (5' 5)   Admission weight: 62.4 kg (137 lb 9.6 oz)  Last recorded weight: 60.2 kg (132 lb 11.5 oz)  IBW: 56.75 kg  Percent IBW: 109.5 %  BMI: Body mass index is 22.09 kg/m??.   Usual Body Weight:  Pt notes that she gained +30# while on home TPN; she does not want to maintain that weight. She states that her weight has been stable up until the previous two weeks.    Weight history prior to admission:  -7# in 2 weeks by her report (-5% in 2 weeks, significant). Wt history is stable per EMR data.     Wt Readings from Last 10 Encounters:   10/11/22 60.2 kg (132 lb 11.5 oz)   09/24/22 62.4 kg (137 lb 9.6 oz)   09/16/22 62 kg (136 lb 11.2 oz)   09/02/22 63 kg (139 lb)   08/05/22 62.1 kg (137 lb)   07/18/22 62.8 kg (138 lb 6.4 oz)   07/16/22 62.1 kg (137 lb)   07/14/22 62.9 kg (138 lb 9.6 oz)   06/15/22 63.6 kg (140 lb 3.4 oz)   06/06/22 60.3 kg (132 lb 14.4 oz)        Weight changes this admission:   Last 5 Recorded Weights    09/28/22 0912 09/29/22 1300 10/06/22 1021 10/09/22 1730   Weight: 62.3 kg (137 lb 5.6 oz) 62.1 kg (137 lb) 60.8 kg (134 lb 1.6 oz) 61 kg (134 lb 6.4 oz)    10/11/22 0530   Weight: 60.2 kg (132 lb 11.5 oz)        Nutrition Focused Physical Exam:  Nutrition Focused Physical Exam:  Fat Areas Examined  Orbital: No loss  Upper Arm: No loss      Muscle Areas Examined  Temple: No loss  Clavicle: No loss  Acromion: No loss  Dorsal Hand: No loss  Patellar: Mild loss  Anterior Thigh: Mild loss  Posterior Calf: Mild loss              Nutrition Evaluation  Overall Impressions: Mild muscle loss, No fat loss (09/29/22 1438)      NUTRITIONALLY RELEVANT DATA     Nutritionally pertinent medications reviewed and evaluated for potential food and/or medication interactions.    Miralax, Senna, Protonix, Pepcid, LR infusion at 100 ml/hr, Remeron    Labs:   Nutritionally pertinent labs reviewed.     Nutrition History:   08/14: TPN 7/21-8/13. Now with blood cultures growing GNR and fevering. NPO for VIR to today to remove central access. CT scan shows bowel tethering, but no transition point, so no complete obstruction. Niacin WNL, zinc deficiency.       September 26, 2022: Prior to admission:  Pt states that she has been doing well with oral nutrition until Saturday when po intake declined prior to this admission. She  does not drink oral supplements at home but would accept them during this admission with evidence for continued poor po intake of meals.  Of note pt has a complex nutrition history in the setting of Gardner's Syndrome including recently requiring TPN/EN to meet estimated needs. She was recently admited to [facility] in June 2024 where she stated she has been prioritizing adequate nutrition to avoid having to resume TPN. No percent po intake of meal data to review with EMR data. Pt was advised not to take po food or water in preparation for a procedure. She endorses continued nausea without vomiting. Pt reports BM today 07/19.     Allergies, Intolerances, Sensitivities, and/or Cultural/Religious Dietary Restrictions: include papaya    Current Nutrition:  Oral intake   Enteral nutrition via NG tube    Nutrition Orders            Osmolite 1.0 (Standard Isotonic) continuous tube feed starting at 08/18 1000    Nutrition Therapy Regular/House starting at 08/16 1450    Supplement Adult; Ensure Plus High Protein (High Calorie/High Protein); # of Products PER Serving: 1 2xd PC starting at 08/14 1800            Nutritional Needs:   Daily Estimated Nutrient Needs:  Energy: 1500-1800 kcals 25-30 kcal/kg using last recorded weight, 60 kg (10/26/22 1155)]  Protein: 72-90 gm [1.2-1.5 gm/kg using last recorded weight, 60 kg (10/26/22 1155)]  Carbohydrate:   [no restriction]  Fluid:   mL [1 mL/kcal (maintenance)]    Malnutrition Assessment using AND/ASPEN or GLIM Clinical Characteristics:  Patient does not meet AND/ASPEN criteria for malnutrition at this time (09/29/22 1439)             GOALS and EVALUATION     Patient to meet 75% or greater of nutritional needs via enteral nutrition while remains on tube feeds. - New  Patient to meet 80% or greater of nutritional needs via parenteral nutrition within 1 week.  - Met/No longer indicated  Patient to consume 75% or greater of po intake via combination of meals, snacks, and/or oral supplements within hospital stay remainder.  - Ongoing and Not Meeting    Motivation, Barriers, and Compliance:  Evaluation of motivation, barriers, and compliance completed. No concerns identified at this time.     NUTRITION ASSESSMENT     Patient appropriate for enteral nutrition given inability to meet nutritional needs via po intake.       Discharge Planning:   Monitor for potential discharge needs with multi-disciplinary team.     Was the nutrition care plan completed? No, unable to diagnose malnutrition at this time       NUTRITION INTERVENTIONS and RECOMMENDATION       Recommend Osmolite 1.5 Cal at goal rate 50 mL/hr. This provides 1575 kcals, 66 g protein, 214 g carbohydrate, 52 g fat, 0 g fiber, 801 mL free water, and meets 105% USRDI.  Start at 20 ml and increase by 20 ml Q4H as tolerated until at goal rate  FWF per MD team, 150 ml Q4H to meet 1 ml/kcal  Continue ensure plus high protein BID as tolerated    Follow-Up Parameters:     1-2 times per week (and more frequent as indicated)    Alesia Morin, RD, LDN

## 2022-10-27 LAB — CBC
HEMATOCRIT: 27.2 % — ABNORMAL LOW (ref 34.0–44.0)
HEMOGLOBIN: 8.9 g/dL — ABNORMAL LOW (ref 11.3–14.9)
MEAN CORPUSCULAR HEMOGLOBIN CONC: 32.7 g/dL (ref 32.0–36.0)
MEAN CORPUSCULAR HEMOGLOBIN: 26.4 pg (ref 25.9–32.4)
MEAN CORPUSCULAR VOLUME: 80.8 fL (ref 77.6–95.7)
MEAN PLATELET VOLUME: 10.2 fL (ref 6.8–10.7)
PLATELET COUNT: 170 10*9/L (ref 150–450)
RED BLOOD CELL COUNT: 3.37 10*12/L — ABNORMAL LOW (ref 3.95–5.13)
RED CELL DISTRIBUTION WIDTH: 15.1 % (ref 12.2–15.2)
WBC ADJUSTED: 6.4 10*9/L (ref 3.6–11.2)

## 2022-10-27 LAB — HEPATIC FUNCTION PANEL
ALBUMIN: 2.9 g/dL — ABNORMAL LOW (ref 3.4–5.0)
ALKALINE PHOSPHATASE: 273 U/L — ABNORMAL HIGH (ref 46–116)
ALT (SGPT): 187 U/L — ABNORMAL HIGH (ref 10–49)
AST (SGOT): 41 U/L — ABNORMAL HIGH (ref <=34)
BILIRUBIN DIRECT: 0.1 mg/dL (ref 0.00–0.30)
BILIRUBIN TOTAL: 0.3 mg/dL (ref 0.3–1.2)
PROTEIN TOTAL: 5.8 g/dL (ref 5.7–8.2)

## 2022-10-27 LAB — TRIGLYCERIDES: TRIGLYCERIDES: 98 mg/dL (ref 0–150)

## 2022-10-27 MED ADMIN — diclofenac sodium (VOLTAREN) 1 % gel 2 g: 2 g | TOPICAL | @ 17:00:00

## 2022-10-27 MED ADMIN — micafungin (MYCAMINE) 100 mg in sodium chloride 0.9 % (NS) 100 mL IVPB-MBP: 100 mg | INTRAVENOUS | @ 02:00:00 | Stop: 2022-11-05

## 2022-10-27 MED ADMIN — diclofenac sodium (VOLTAREN) 1 % gel 2 g: 2 g | TOPICAL | @ 21:00:00

## 2022-10-27 MED ADMIN — promethazine (PHENERGAN) 6.5 mg in sodium chloride (NS) 0.9 % 50 mL IVPB: 6.5 mg | INTRAVENOUS | @ 22:00:00

## 2022-10-27 MED ADMIN — HYDROmorphone (PF) (DILAUDID) injection 1 mg: 1 mg | INTRAVENOUS | @ 04:00:00 | Stop: 2022-11-04

## 2022-10-27 MED ADMIN — bisacodyl (DULCOLAX) suppository 10 mg: 10 mg | RECTAL | @ 02:00:00

## 2022-10-27 MED ADMIN — cefepime (MAXIPIME) 2 g in sodium chloride 0.9 % (NS) 100 mL IVPB-MBP: 2 g | INTRAVENOUS | @ 17:00:00 | Stop: 2022-10-28

## 2022-10-27 MED ADMIN — HYDROmorphone (PF) (DILAUDID) injection 1 mg: 1 mg | INTRAVENOUS | @ 15:00:00 | Stop: 2022-11-04

## 2022-10-27 MED ADMIN — baclofen (LIORESAL) tablet 20 mg: 20 mg | ORAL | @ 17:00:00

## 2022-10-27 MED ADMIN — HYDROmorphone (PF) (DILAUDID) injection 1 mg: 1 mg | INTRAVENOUS | @ 18:00:00 | Stop: 2022-11-04

## 2022-10-27 MED ADMIN — mucositis mixture (with lidocaine): 10 mL | OROMUCOSAL | @ 02:00:00

## 2022-10-27 MED ADMIN — cefepime (MAXIPIME) 2 g in sodium chloride 0.9 % (NS) 100 mL IVPB-MBP: 2 g | INTRAVENOUS | @ 02:00:00 | Stop: 2022-10-28

## 2022-10-27 MED ADMIN — famotidine (PEPCID) tablet 20 mg: 20 mg | ORAL | @ 02:00:00

## 2022-10-27 MED ADMIN — naloxegol (MOVANTIK) tablet 25 mg: 25 mg | ORAL | @ 15:00:00

## 2022-10-27 MED ADMIN — baclofen (LIORESAL) tablet 20 mg: 20 mg | ORAL | @ 02:00:00

## 2022-10-27 MED ADMIN — HYDROmorphone (PF) (DILAUDID) injection 1 mg: 1 mg | INTRAVENOUS | @ 01:00:00 | Stop: 2022-11-04

## 2022-10-27 MED ADMIN — promethazine (PHENERGAN) 6.5 mg in sodium chloride (NS) 0.9 % 50 mL IVPB: 6.5 mg | INTRAVENOUS | @ 02:00:00

## 2022-10-27 MED ADMIN — buprenorphine-naloxone (SUBOXONE) 2-0.5 mg SL film 0.5 mg of buprenorphine: .25 | SUBLINGUAL | @ 19:00:00

## 2022-10-27 MED ADMIN — cefepime (MAXIPIME) 2 g in sodium chloride 0.9 % (NS) 100 mL IVPB-MBP: 2 g | INTRAVENOUS | @ 08:00:00 | Stop: 2022-10-28

## 2022-10-27 MED ADMIN — HYDROmorphone (PF) (DILAUDID) injection 1 mg: 1 mg | INTRAVENOUS | @ 12:00:00 | Stop: 2022-11-04

## 2022-10-27 MED ADMIN — senna (SENOKOT) tablet 2 tablet: 2 | ORAL | @ 02:00:00

## 2022-10-27 MED ADMIN — promethazine (PHENERGAN) 6.5 mg in sodium chloride (NS) 0.9 % 50 mL IVPB: 6.5 mg | INTRAVENOUS | @ 15:00:00

## 2022-10-27 MED ADMIN — mirtazapine (REMERON) tablet 15 mg: 15 mg | GASTROENTERAL | @ 04:00:00

## 2022-10-27 MED ADMIN — HYDROmorphone (PF) (DILAUDID) injection 1 mg: 1 mg | INTRAVENOUS | @ 09:00:00 | Stop: 2022-11-04

## 2022-10-27 MED ADMIN — HYDROmorphone (PF) (DILAUDID) injection 1 mg: 1 mg | INTRAVENOUS | @ 21:00:00 | Stop: 2022-11-04

## 2022-10-27 MED ADMIN — pantoprazole (Protonix) EC tablet 40 mg: 40 mg | ORAL | @ 02:00:00

## 2022-10-27 MED ADMIN — buprenorphine-naloxone (SUBOXONE) 2-0.5 mg SL film 0.5 mg of buprenorphine: .25 | SUBLINGUAL | @ 02:00:00

## 2022-10-27 MED ADMIN — buprenorphine-naloxone (SUBOXONE) 2-0.5 mg SL film 0.5 mg of buprenorphine: .25 | SUBLINGUAL | @ 13:00:00

## 2022-10-27 MED ADMIN — cetirizine (ZYRTEC) tablet 10 mg: 10 mg | GASTROENTERAL | @ 13:00:00

## 2022-10-27 MED ADMIN — pantoprazole (Protonix) EC tablet 40 mg: 40 mg | ORAL | @ 13:00:00

## 2022-10-27 MED ADMIN — lactated Ringers infusion: 100 mL/h | INTRAVENOUS | @ 01:00:00

## 2022-10-27 MED ADMIN — promethazine (PHENERGAN) 6.5 mg in sodium chloride (NS) 0.9 % 50 mL IVPB: 6.5 mg | INTRAVENOUS | @ 09:00:00

## 2022-10-27 MED ADMIN — baclofen (LIORESAL) tablet 20 mg: 20 mg | ORAL | @ 13:00:00

## 2022-10-27 NOTE — Unmapped (Signed)
Pt is A&Ox4, VSS, O2 sats >90% on [place] 2L/min. Afebrile. Frequent c/o pain and nausea, received PRN medications with some relief. UO adequate, 2 BM this shift. Pt's tolerating tube feeds at goal prior to being made NPO at midnight for TEE. Skin intact, Q2H turns and standard precautions maintained. No falls/injuries this shift. Mother at bedside. All monitors with appropriate alarm settings, call bell within reach, see flowsheets/MAR for further info.        Problem: Adult Inpatient Plan of Care  Goal: Plan of Care Review  Outcome: Ongoing - Unchanged  Goal: Patient-Specific Goal (Individualized)  Outcome: Ongoing - Unchanged  Goal: Absence of Hospital-Acquired Illness or Injury  Outcome: Ongoing - Unchanged  Intervention: Identify and Manage Fall Risk  Recent Flowsheet Documentation  Taken 10/26/2022 2000 by Lavone Neri, RN  Safety Interventions:   aspiration precautions   bleeding precautions   commode/urinal/bedpan at bedside   fall reduction program maintained   family at bedside   infection management   lighting adjusted for tasks/safety   low bed   room near unit station   nonskid shoes/slippers when out of bed   safety attendant   toileting scheduled  Intervention: Prevent Infection  Recent Flowsheet Documentation  Taken 10/26/2022 2000 by Lavone Neri, RN  Infection Prevention:   equipment surfaces disinfected   hand hygiene promoted   personal protective equipment utilized   rest/sleep promoted   single patient room provided  Goal: Optimal Comfort and Wellbeing  Outcome: Ongoing - Unchanged  Goal: Readiness for Transition of Care  Outcome: Ongoing - Unchanged  Goal: Rounds/Family Conference  Outcome: Ongoing - Unchanged     Problem: Latex Allergy  Goal: Absence of Allergy Symptoms  Outcome: Ongoing - Unchanged  Intervention: Maintain Latex-Aware Environment  Recent Flowsheet Documentation  Taken 10/26/2022 2000 by Lavone Neri, RN  Latex Precautions: latex precautions maintained     Problem: Fall Injury Risk  Goal: Absence of Fall and Fall-Related Injury  Outcome: Ongoing - Unchanged  Intervention: Promote Scientist, clinical (histocompatibility and immunogenetics) Documentation  Taken 10/26/2022 2000 by Lavone Neri, RN  Safety Interventions:   aspiration precautions   bleeding precautions   commode/urinal/bedpan at bedside   fall reduction program maintained   family at bedside   infection management   lighting adjusted for tasks/safety   low bed   room near unit station   nonskid shoes/slippers when out of bed   safety attendant   toileting scheduled     Problem: Skin Injury Risk Increased  Goal: Skin Health and Integrity  Outcome: Ongoing - Unchanged  Intervention: Optimize Skin Protection  Recent Flowsheet Documentation  Taken 10/27/2022 0400 by Lavone Neri, RN  Head of Bed [facility]) Positioning: HOB at 30-45 degrees  Taken 10/27/2022 0017 by Lavone Neri, RN  Head of Bed [facility]) Positioning: HOB at 45 degrees  Taken 10/26/2022 2200 by Lavone Neri, RN  Head of Bed [facility]) Positioning: HOB at 45 degrees  Taken 10/26/2022 2000 by Lavone Neri, RN  Head of Bed [facility]) Positioning: HOB at 30-45 degrees     Problem: Self-Care Deficit  Goal: Improved Ability to Complete Activities of Daily Living  Outcome: Ongoing - Unchanged     Problem: Comorbidity Management  Goal: Blood Glucose Levels Within Targeted Range  Outcome: Ongoing - Unchanged  Goal: Blood Pressure in Desired Range  Outcome: Ongoing - Unchanged     Problem: Nausea and Vomiting  Goal: Nausea and Vomiting Relief  Outcome: Ongoing - Unchanged     Problem:  Infection  Goal: Absence of Infection Signs and Symptoms  Outcome: Ongoing - Unchanged  Intervention: Prevent or Manage Infection  Recent Flowsheet Documentation  Taken 10/26/2022 2000 by Lavone Neri, RN  Infection Management: aseptic technique maintained     Problem: Pain Acute  Goal: Optimal Pain Control and Function  Outcome: Ongoing - Unchanged     Problem: Malnutrition  Goal: Improved Nutritional Intake  Outcome: Ongoing - Unchanged

## 2022-10-27 NOTE — Unmapped (Signed)
Division of Infectious Diseases  General Inpatient Consultation Service     For questions about this consult, page [phone] (Gen A Follow-up Pager).    [name] [name] is being seen in consultation at the request of [name] Furl, MD for evaluation and management of candida fungemia and GNR bacteremia.     PLAN FOR 10/27/2022    Diagnostic    Awaiting TEE under GA planned for tomorrow.  Follow up blood cultures sent 8/17 to completion  Monitor for antimicrobial toxicity with the following:  CBC w/diff at least once per week  clinical assessments for rashes or other skin changes  CMP daily for now    Treatment  STOP Cefepime.  START on Cefazolin, based on GNR susceptibilities.Dose as per renal function.  Continue on Micafungin 100mg  daily, awaiting susceptibility on candida albicans, if sensitive to Fluconazole recommend changing to Fluconazole.  Patient preference is on nystatin mouth wash.  Duration of therapy = TBD  start date = 10/21/22  end date = TBD    I discussed the plans for today with family/caregiver(s) and patient on 10/27/2022.    Our service will continue to follow.    I personally spent 45 mins face-to-face and non-face-to-face in the care of this patient on 10/27/2022, which includes all pre, intra, and post visit time on the date of service.  All documented time was specific to the E/M visit and does not include any procedures that may have been performed.    [name] [name], MBBS   Division of Infectious Diseases                MDM and Problem-Specific Assessments  ( .00ID2DAY  /  .[id number]  /  .IDSS  / .[name] )     22 y.o. 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 Subsequently found to have Candidemia and GNR bacteremia, likely due to TPN/CVC, now s/p RIJ removal on 8/14. TTE with possible vegetations in right atrium, awaiting TEE under general anesthesia. With fluctuating LFTs of unclear etiology, monitoring closely for now, if they jump significantly may adjust antimicrobial regimen.    October 27, 2022   Patient has: []  acute illness w/systemic sxs  [mod] [x]  illness posing risk to life or function  [high]   I reviewed:   (3+) [x]  primary team note [x]  consultant note(s) []  procedure/op note(s) [x]  micro result(s)    [x]  CBC results []  chemistry results []  radiology report(s) []  nursing note(s)   I independently visualized:   (any)   []  cxs/plates in lab []  plain film images []  CT images []  PET images    []  path slide(s) []  ECG tracing []  MRI images []  nuclear scan   I discussed: (any) [x]  micro and/or path w/lab personnel []  drug options and/or interactions w/ID pharmD    []  procedure/OR findings w/other MD(s) []  echo and/or imaging w/other MD(s)    []  mgm't w/attending(s) involved in case []  setting up home abx w/OPAT team   Mgm't requires: []  prescription drug(s)  [mod] [x]  intensive toxicity monitoring  [high]     Interval notes & result reports show: Afebrile. VSS. TEE planned for today.     Patient reports: Had a really bad night last night and did not sleep.Nauseous with head/neck pain. Tongue hurts. TEE pushed to tomorrow.     My interpretation of  the data I reviewed is: WBC 6.4 WNL. Crt 0.40. No new imaging. 8/15 Ux <10,000 CFU/mL Candida albicans. 8/17 Bcx 2/2 NGTD.      # GNR bacteremia  -    ACUTE = LESS THAN 1 YEAR DURATION  # Yeast in blood Cx  Patient admitted since 7/18 with course c/b worsening RLQ abdominal pain in setting of new recurrent fevers and tachycardia. Of note, R IJ for TPN present since 01/2022. Blood cultures on 8/13 grew 1/4 GNR, lactose fermenter, family Enterobacteriaceae, blood culture also growing yeast (Candida albicans).  TTE (8/15) demonstrated mobile echodensity in right atrium, cannot rule out vegetation. Plan is for TEE for additional evaluation. With fluctuating LFTs, unclear etiology, for now continuing cefepime and micafungin and monitoring closely  Management is as per the blue box above.    # Background of Gardner's syndrome, desmoid tumors-  ACUTE = LESS THAN 1 YEAR DURATION  Diagnosed around the age of 1.  [name] [name] currently on hold.    # Circumferential bladder wall thickening on CT from 10/22/2022 -    ACUTE = LESS THAN 1 YEAR DURATION  Ua from 10/22/2022 positive.  Management is as per the blue box above.    # Muscle rigidity, increased tone and spasms   -    ACUTE = LESS THAN 1 YEAR DURATION  09/25/2022 had injection of desmoid tumors with steroid/anesthetic under US guidance. For desmoid tumors in right forearm and left chest wall. Had multiple injections like these in the past without complications.  Admitted on 7.19 for observations , her workup included MRI spine and CK which were unremarkable.  Management is as per the blue box above.    # Management of prescription antimicrobials needing intensive toxicity monitoring - acute, poses threat to life or bodily function  [high]  Cefepime can cause various types of neurotoxicity, including depressed consciousness, encephalopathy, aphasia, myoclonus, seizures, and coma. Echinocandin antigfungals can cause phlebitis, skin rashes, N/V/D, hepatotoxicity, neutropenia, thrombocytopenia, and/or infusion-related flushing, pruritis, or fever.   See recommendations in blue box above.    # Disposition  TBD            Antimicrobials & Other Medications  ( .00IDGANTT  /  .00IDGANTTLIST  )     Current  Cefepime 8/13-    Micafungin 8/14-    Previous  Metronidazole 8/13-8/16  Vancomycin 8/13-8/15    Immunomodulators and antipyretics  None       Current Medications as of 10/27/2022  Scheduled  PRN   baclofen, 20 mg, TID  bisacodyl, 10 mg, BID  buprenorphine-naloxone, 0.25 Film, TID  cefepime, 2 g, Q8H  cetirizine, 10 mg, Daily  diclofenac sodium, 2 g, QID  enoxaparin (LOVENOX) injection, 40 mg, Q24H SCH  famotidine, 20 mg, Nightly  micafungin, 100 mg, Q24H  mirtazapine, 15 mg, Nightly  naloxegol, 25 mg, Daily  [Provider Hold] [name] [name], 50 mg, BID  pantoprazole, 40 mg, BID  polyethylene glycol, 17 g, BID  senna, 2 tablet, Nightly      acetaminophen, 650 mg, Q6H PRN  aluminum-magnesium hydroxide-simethicone, 60 mL, Q6H PRN  calcium carbonate, 400 mg elem calcium, Daily PRN  Chemo Clarification Order, , Continuous PRN  guaiFENesin, 200 mg, Q4H PRN  HYDROmorphone, 1 mg, Q3H PRN  IP okay to treat, , Continuous PRN  lidocaine, , TID PRN  LORazepam, 1 mg, BID PRN  LORazepam, 0.5 mg, TID PRN  lidocaine-diphenhydrAMINE-aluminum-magnesium, 10 mL, QID PRN  ondansetron, 4 mg, Q12H PRN  oxyCODONE, 10 mg, Q4H PRN  phenol, 2 spray, Q2H PRN  polyethylene glycol, 17 g, Daily PRN  promethazine, 6.5 mg, Q6H PRN  pseudoephedrine, 30 mg, Daily PRN  zinc oxide-cod liver oil, , Daily PRN           Physical Exam     Temp:  [36.6 ??C (97.8 ??F)-37 ??C (98.6 ??F)] 36.6 ??C (97.9 ??F)  Heart Rate:  [71-94] 71  SpO2 Pulse:  [71-95] 71  Resp:  [12-23] 12  BP: (100-139)/(45-86) 100/45  MAP (mmHg):  [63-96] 63  SpO2:  [98 %-100 %] 98 %    Actual body weight: 54 kg (119 lb)  Ideal body weight: 57 kg (125 lb 10.6 oz)      Const [x]  vital signs above      []  WDWN, NAD, non-toxic appearance  [x]  Uncomfortable appearing, NAD      Eyes   []  Lids normal bilaterally, conjunctiva anicteric and noninjected OU  []  PERRL   []        ENMT     [x]  Normal appearance of external nose and ears       [x]  No visible thrush  []  MMM, no lesions on lips or gums, dentition good        []  Hearing normal   [x]  NG tube in place.      Neck    []  Neck of normal appearance and trachea midline        []  No thyromegaly, nodules, or tenderness   [x]  RIJ central line in place      Lymph    []  No LAD in neck       []  No LAD in supraclavicular area       []  No LAD in axillae   []  No LAD in epitrochlear chains       []  No LAD in inguinal areas  []        CV    [x]  RRR, no m/r/g, S1/S2       []  No peripheral edema, WWP       []  Pedal pulses intact   []        Resp   [x]  Normal WOB       []  CTAB   []        GI   []  Normal inspection, NTND, NABS       []  No umbilical hernia on exam       []  No hepatosplenomegaly       []  Inspection of perineal and perianal areas normal  [x]  Diffusely tender to palpation      GU   []  Normal external genitalia       []        MSK   []  No clubbing or cyanosis of hands       []  No focal tenderness or abnormalities on palpation of joints in RUE, LUE, RLE, or LLE  []        Skin   [x]  No rashes, lesions, or ulcers of visualized skin       [x]  Skin warm and dry to palpation   []        Neuro   []  CNs II-XII grossly intact       []  Sensation to light touch grossly intact throughout   []  DTRs normal and symmetric throughout   [x]  Unable to assess due to critical illness, sedation, or mental status  []        Psych   [x]  Appropriate affect      [x]  Oriented to  person, place, time      [x]  Judgment and insight are appropriate   []  Unable to assess due to critical illness, sedation, or mental status  []           Patient Lines/Drains/Airways Status       Active Active Lines, Drains, & Airways       Name Placement date Placement time Site Days    CVC Triple Lumen 10/22/22 Non-tunneled Right Internal jugular 10/22/22  1521  Internal jugular  4    NG/OG Tube Decompression Right nostril 09/26/22  1000  Right nostril  30                      Data for ID Decision Making  ( IDGENCONMDM )       Micro & Serological Data   ( RSLTMICRO  /  [id number]  /  00CXSRC  /  00CXRES  /  00CXSUSC )    Microbiology Results (last day)       Procedure Component Value Date/Time Date/Time    Blood Culture #1 [[id number]]  (Normal) Collected: 10/25/22 0609    Lab Status: Preliminary result Specimen: Blood from 1 Peripheral Draw Updated: 10/27/22 0630     Blood Culture, Routine No Growth at 48 hours    Blood Culture #2 [[id number]]  (Normal) Collected: 10/25/22 0610    Lab Status: Preliminary result Specimen: Blood from 1 Peripheral Draw Updated: 10/27/22 0630     Blood Culture, Routine No Growth at 48 hours    Blood Culture #1 [[id number]]  (Normal) Collected: 10/21/22 1814    Lab Status: Final result Specimen: Blood from 1 Peripheral Draw Updated: 10/26/22 1845     Blood Culture, Routine No Growth at 5 days    Blood Culture #2 [[id number]]  (Normal) Collected: 10/21/22 1815    Lab Status: Final result Specimen: Blood from 1 Peripheral Draw Updated: 10/26/22 1845     Blood Culture, Routine No Growth at 5 days             7/19 Bcx NGTD  8/13 Bcx 1/2 GNR, family enterobacteriaceae (R-ampi; S-cefazolin, cipro, gent, levo, pip+tazo)  8/13 Bcx 2/2 NGTD  8/15 Ux <10,000 CFU/mL Candida albicans  8/17 Bcx 2/2 NGTD    Recent Studies  ( RISRSLT )    No results found.          Initial Consult Documentation from October 22, 2022     Sources of information include: chart review, patient, and family/friend(s).    History of Present Illness:     22 y.o. 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    Patient has been admitted to [facility] since 7/18 with chronic abdominal pain and restarted on TPN in the setting of low PO intake. Patient was noted with chronic abdominal pain and nausea that is secondary to desmoid tumors and prior surgeries. More recently on 8/11, patient was noted with peristalsis in right lower quadrant. She was last noted with bowel movement on 8/11. Since, she has had worsening abdominal pain. Overnight of 8/12, patient was febrile to 38.7 despite tylenol and tachycardic to 140s. Since, patient has been having recurrent fevers and tachycardic with no leukocytosis. CTAP (8/13) was unremarkable for acute processes and demonstrated no small bowel obstruction. Blood cultures on 8/13 grew 1/2 GNR.  Patient was started on cefepime, vancomycin, and metronidazole on 8/13. Right IJ was removed on 8/14.    Of note, pouchoscopy on 02/2022 showed 1cm of circumferential ulceration with severe, active pouchitis which improved ~2 months later. Repeat pouchoscopy on 07/16/22 demonstrated small ulcerations on suture line and a rectal cuff beginning 1cm from anal verge and extending 2cm with healthy appearing mucosa. Patient has been on TPN since 09/28/22 through nontunneled right IJ that has been present since 01/2022.    Past Medical History   Patient  has a past medical history of Abdominal pain, Acid reflux, Anesthesia complication, Cancer (CMS-HCC), Cataract of right eye, COVID-19 virus infection (01/2019), Cyst of thyroid determined by ultrasound, [name], Difficult intravenous access, [name] (familial adenomatous polyposis), [name], Gastric polyps, History of chemotherapy, History of colon polyps, History of COVID-19 (01/2019), Iron deficiency anemia due to chronic blood loss, PONV (postoperative nausea and vomiting), Rectal bleeding, and Syncopal episodes.      Meds and Allergies  Patient has a current medication list which includes the following prescription(s): lorazepam, promethazine, acetaminophen, baclofen, cefdinir, cholecalciferol (vitamin d3 25 mcg (1,000 units)), clobetasol, diclofenac sodium, doxycycline, famotidine, hydrocortisone, lidocaine, lorazepam, mirtazapine, naloxone, [name] [name], oxycodone, pimecrolimus, triamcinolone, and zinc oxide, and the following Facility-Administered Medications: acetaminophen, aluminum-magnesium hydroxide-simethicone, baclofen, bisacodyl, buprenorphine-naloxone, calcium carbonate, cefepime (MAXIPIME) 2 g in sodium chloride 0.9 % (NS) 100 mL IVPB-MBP, cetirizine, CHEMO CLARIFICATION ORDER, diclofenac sodium, enoxaparin, famotidine, guaifenesin, hydromorphone (pf), IP OKAY TO TREAT, lidocaine, lorazepam, lorazepam, micafungin (MYCAMINE) 100 mg in sodium chloride 0.9 % (NS) 100 mL IVPB-MBP, mirtazapine, lidocaine-diphenhydramine-aluminum-magnesium, naloxegol, [Provider Hold] [name] [name], ondansetron, oxycodone, pantoprazole, phenol, polyethylene glycol, polyethylene glycol, promethazine (PHENERGAN) 6.5 mg in sodium chloride (NS) 0.9 % 50 mL IVPB, pseudoephedrine, senna, sodium chloride, and zinc oxide-cod liver oil.    Allergies: Adhesive tape-silicones; Ferrlecit [sodium ferric gluconat-sucrose]; Levofloxacin; Methylnaltrexone; Neomycin; Papaya; Morphine; Zosyn [piperacillin-tazobactam]; Compazine [prochlorperazine]; Iron analogues; Iron dextran; and Latex, natural rubber       Social History  Patient  reports that she has never smoked. She has been exposed to tobacco smoke. She has never used smokeless tobacco. She reports that she does not drink alcohol and does not use drugs.     Scribe's Attest:  [name] Rud, MD obtained and performed the history, physical exam and medical decision making elements that were entered into the chart. Documentation assistance was provided by me personally. Signed by Lanelle Bal, Scribe, on October 27, 2022 at 8:27 AM.     Provider???s Attestation:   Documentation assistance provided by the Scribe, Lanelle Bal. I was present during the time the encounter was [name] Rud, MD. October 27, 2022 at 8:27 AM

## 2022-10-27 NOTE — Unmapped (Signed)
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 She was put back on TPN and remained admitted due to abdominal pain and debilitating muscle spasms/rigidity of uncertain etiology. Hospital course now further complicated by bacteremia/fungemia.     GNR bacteremia/Candida fungemia 8/13 : Source now presumably her central line that she was getting TPN through. She is no longer having fevers and is clinically slowly improving . Central line exchanged 8/14  - Consulted ID appreciate recs  - Follow-up repeat cultures  - Continue cefepime and micafungin  - TTE abnormal so awaiting TEE under general anesthesia tomorrow     Chronic abdominal pain, nausea 2/2 desmoid tumors and prior surgeries: Continues to have significant nausea with inadequate intake  - Continue scheduled bowel regimen  -Will preferably remove NG tube although given her intake over the past few days we will need to start tube feeds. Can hopefully remove in coming days once nutrition adequate  -Will try to place corpak during TEE tomorrow   - Pain control  - Encouraging her to limit opiates as much as possible     Muscle rigidity, increased tone and spasms : Occurred post-procedurally, with reassuring evaluation (MRI spine, neuro c/s, CK normal) at that time. Increased tone seems to worsen during acute stress to her body (anesthesia, infection). Increased baclofen does not seem to be helping much. MRI brain normal . Per Neurology concern for functional component   - Appreciate chronic pain and PM&R: increased baclofen to 20  - Neurology considering EMG  - Baclofen 20 TID     Elevated LFTs, intermittent: LFTs continue to fluctuate.  Unclear etiology and previously thought related to TPN versus chemotherapy although is now not on either.  -Continue to monitor daily  -Will consider expanded workup if persistent rise      Iron deficiency anemia: Ferritin 5, iron saturation 6%. Has had prior reactions with numerous attempts to give IV iron.   - S/p Feraheme 1020 mg. Received pre-med protocol per A&I  - Would not give IV iron except when it cannot be avoided given severe itching/stress it causes her     Headaches:  - Enteral PRN tylenol, fioricet daily prn could be considered if remains severe  - Avoid NSAIDs, avoid IV benadryl     Desmoid tumors - [name]: Chemotherapy on hold in setting of infections.      SIBO: Hold home cefdinir, doxy    VTE PPX: Lovenox    Disposition: Skilled nursing/AIR  Medically Clear: No    I personally spent greater than 55 minutes face-to-face and non-face-to-face in the care of this patient, which includes all pre, intra, and post visit time on the date of service.  All documented time was specific to the E/M visit and does not include any procedures that may have been performed.  __________________________________________________________________    Subjective:    No acute events overnight, unfortunately TEE canceled this morning. She started to feel a little better yesterday while on tube feeds although have been on hold this morning .    Objective    Temp:  [36.6 ??C (97.8 ??F)-37 ??C (98.6 ??F)] 36.7 ??C (98.1 ??F)  Heart Rate:  [71-94] 93  SpO2 Pulse:  [71-95] 71  Resp:  [12-23] 16  BP: (100-139)/(45-86) 110/83  SpO2:  [  98 %-100 %] 99 %    Chronically ill-appearing young female sitting upright in bed appearing tired, NAD  Moist mucous membranes, nasal gastric tube in place  Heart rate and rhythm normal, no murmurs  Lungs clear to auscultation bilaterally  Abdomen soft, nontender, slightly distended  No lower extremity edema    Recent Results (from the past 24 hour(s))   CBC    Collection Time: 10/27/22  6:56 AM   Result Value Ref Range    WBC 6.4 3.6 - 11.2 10*9/L    RBC 3.37 (L) 3.95 - 5.13 10*12/L    HGB 8.9 (L) 11.3 - 14.9 g/dL    HCT 16.1 (L) 09.6 - 44.0 %    MCV 80.8 77.6 - 95.7 fL    MCH 26.4 25.9 - 32.4 pg    MCHC 32.7 32.0 - 36.0 g/dL    RDW 04.5 40.9 - 81.1 %    MPV 10.2 6.8 - 10.7 fL    Platelet 170 150 - 450 10*9/L   Hepatic Function Panel    Collection Time: 10/27/22  6:56 AM   Result Value Ref Range    Albumin 2.9 (L) 3.4 - 5.0 g/dL    Total Protein 5.8 5.7 - 8.2 g/dL    Total Bilirubin 0.3 0.3 - 1.2 mg/dL    Bilirubin, Direct 9.14 0.00 - 0.30 mg/dL    AST 41 (H) <=78 U/L    ALT 187 (H) 10 - 49 U/L    Alkaline Phosphatase 273 (H) 46 - 116 U/L   Triglycerides    Collection Time: 10/27/22  9:29 AM   Result Value Ref Range    Triglycerides 98 0 - 150 mg/dL     Labs/Studies:  Labs and Studies from the last 24hrs per EMR and Reviewed      Principal Problem:    Other acute postprocedural pain  Active Problems:    [name]    Intestinal polyps    [name]    Neoplasm related pain    Physical deconditioning    History of colectomy    Intractable abdominal pain    Generalized anxiety disorder with panic attacks    SBO (small bowel obstruction) (CMS-HCC)    Small intestinal bacterial overgrowth (SIBO)

## 2022-10-27 NOTE — Unmapped (Signed)
TRANSESOPHAGEAL ECHOCARDIOGRAM CANCELLATION / DELAY NOTE    A transesophageal echocardiogram (TEE) was ordered by the primary team for further evaluation of endcarditis.  The procedure was Delayed due to:    Clinical Volume    We will attempt to perform the procedure on 10/28/2022 pending anesthesia availability.    Case was discussed with the cardiology attending, Dr. Luretha Murphy. If there any further questions, please do not hesitate to reach out to the cardiology fellow at [phone], or the on-call Cardiology pager [phone]) on nights and weekends.Marland Kitchen

## 2022-10-27 NOTE — Unmapped (Signed)
Follow Up Consult Note        Requesting Attending Physician:  [name], MD  Service Requesting Consult: [facility])     Assessment and Plan          [name] [name] is a 22 y.o.  female on whom I have been asked by [name], MD to consult for increased lower extremity tone and weakness.    Increased Tone - Generalized Weakness  Initially she was noted to have worsening BLE stiffness since procedure on 7/18. Acute onset of muscle rigidity isolated to lower extremities associated with anesthesia. Differential includes prolonged anesthesia reaction, nutrition abnormalities, stiff person syndrome or other autoimmune myelopathy. 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 Due to concern for central process given increased tone, repeat MRI brain w wo was obtained which was normal. Nutritional work up completed to for generalized dystonia as patient has had extended periods of TPN and thus far has only been remarkable with mildly low zinc otherwise remainder has been unremarkable. Given unremarkable work up thus far recommended obtaining AI/myelopathy panel to evaluate for stiff person syndrome however atypical to present with acute onset. Will defer EMG at this time while awaiting myelopathy panel as it is inconsistent with peripheral process and results would likely be confounded given her dermoid tumors. On exam and observation, patient has inconsistent rigidity, tone and strength which is concerning for functional overlay. Please page once AI/myelopathy panel.    RECOMMENDATIONS:   AI/myelopathy panel pending 8/18   Continue to work with PT/OT  Defer pain and tone/rigidity medication management to PM&R     We will sign off at this time. Please re-consult if further neurologic questions arise.  This patient was seen and discussed with Dr. Dan Humphreys, who agrees to the assessment and plan.     Chauncy Passy, MD, PharmD  Pediatric Neurology, PGY-4          Subjective        Reason for Consult: Increased lower extremity tone and weakness    - improved rigidity with increasing baclofen but still endorses persistent LE stiffness.       Allergies   Allergen Reactions    Adhesive Tape-Silicones Hives and Rash     Paper tape and Tegederm ok. Biopatch causes blistering but has tolerated CHG Tegaderm    Ferrlecit [Sodium Ferric Gluconat-Sucrose] Swelling and Rash    Levofloxacin Swelling and Rash     Swelling in mouth, rash,     Methylnaltrexone      Per Patient: I lost bowel control, severe abdominal cramping, and elevated BP    Neomycin Swelling     Rxn after ear drops; ear swelling    Papaya Hives    Morphine Nausea And Vomiting    Zosyn [Piperacillin-Tazobactam] Itching and Rash     Red and itchy    Compazine [Prochlorperazine] Other (See Comments)     Extreme agitation    Iron Analogues     Iron Dextran Itching     Received iron dextran 06/08/22 over 12 hours, had itching and redness/flushing during the infusion and for a couple days after. Required IV benadryl w/flares between doses and ultimately treated w/IV methylpred for 2 days.     Latex, Natural Rubber Rash      Current Facility-Administered Medications   Medication Dose Route Frequency Provider Last Rate Last Admin  acetaminophen (TYLENOL) tablet 650 mg  650 mg Enteral tube: gastric Q6H PRN Suzzanne Cloud, MD   650 mg at 10/22/22 2123    aluminum-magnesium hydroxide-simethicone (MAALOX PLUS) 200-200-20 mg/5 mL suspension 60 mL  60 mL Oral Q6H PRN Suzzanne Cloud, MD        baclofen (LIORESAL) tablet 20 mg  20 mg Oral TID Suzzanne Cloud, MD   20 mg at 10/26/22 2136    bisacodyl (DULCOLAX) suppository 10 mg  10 mg Rectal BID Suzzanne Cloud, MD   10 mg at 10/26/22 2137    buprenorphine-naloxone (SUBOXONE) 2-0.5 mg SL film 0.5 mg of buprenorphine  0.25 Film Sublingual TID Lambert Mody, MD PhD   0.5 mg of buprenorphine at 10/26/22 2137    calcium carbonate (TUMS) chewable tablet 400 mg elem calcium  400 mg elem calcium Oral Daily PRN Terri Piedra, AGNP        cefepime (MAXIPIME) 2 g in sodium chloride 0.9 % (NS) 100 mL IVPB-MBP  2 g Intravenous Q8H Suzzanne Cloud, MD   Stopped at 10/27/22 0447    cetirizine (ZYRTEC) tablet 10 mg  10 mg Enteral tube: gastric Daily Suzzanne Cloud, MD   10 mg at 10/26/22 0957    CHEMO CLARIFICATION ORDER   Other Continuous PRN Lestine Box, PharmD        enoxaparin (LOVENOX) syringe 40 mg  40 mg Subcutaneous Q24H [facility] Suzzanne Cloud, MD   40 mg at 10/25/22 2200    famotidine (PEPCID) tablet 20 mg  20 mg Oral Nightly Suzzanne Cloud, MD   20 mg at 10/26/22 2137    guaiFENesin (ROBITUSSIN) oral syrup  200 mg Oral Q4H PRN Kateri Plummer, MD   200 mg at 10/17/22 2344    HYDROmorphone (PF) (DILAUDID) injection 1 mg  1 mg Intravenous Q3H PRN Suzzanne Cloud, MD   1 mg at 10/27/22 0759    IP OKAY TO TREAT   Other Continuous PRN Sheral Apley, MD        lidocaine (LMX) 4 % cream   Topical TID PRN Suzzanne Cloud, MD        LORazepam (ATIVAN) injection 1 mg  1 mg Intravenous BID PRN Suzzanne Cloud, MD   1 mg at 10/26/22 1742    LORazepam (ATIVAN) tablet 0.5 mg  0.5 mg Oral TID PRN Suzzanne Cloud, MD        micafungin [facility]) 100 mg in sodium chloride 0.9 % (NS) 100 mL IVPB-MBP  100 mg Intravenous Q24H Suzzanne Cloud, MD   Stopped at 10/26/22 2238    mirtazapine (REMERON) tablet 15 mg  15 mg Enteral tube: gastric Nightly Kateri Plummer, MD   15 mg at 10/27/22 0016    mucositis mixture (with lidocaine)  10 mL Mouth QID PRN Suzzanne Cloud, MD   10 mL at 10/26/22 2146    naloxegol (MOVANTIK) tablet 25 mg  25 mg Oral Daily Suzzanne Cloud, MD   25 mg at 10/26/22 1093    [Provider Hold] nirogacestat (OGSIVEO) tablet 50 mg *Patient Supplied*  50 mg Oral BID Sheral Apley, MD   50 mg at 10/20/22 2035    ondansetron (ZOFRAN) injection 4 mg  4 mg Intravenous Q12H PRN Suzzanne Cloud, MD   4 mg at 10/26/22 1507    oxyCODONE (ROXICODONE) immediate release tablet 10 mg  10 mg Oral Q4H PRN Suzzanne Cloud, MD  10 mg at 10/24/22 1559    pantoprazole (Protonix) EC tablet 40 mg  40 mg Oral BID Suzzanne Cloud, MD   40 mg at 10/26/22 2137    phenol (CHLORASEPTIC) 1.4 % spray 2 spray  2 spray Mucous Membrane Q2H PRN Rocco Serene, MD   2 spray at 10/03/22 0138    polyethylene glycol (MIRALAX) packet 17 g  17 g Oral Daily PRN Suzzanne Cloud, MD        polyethylene glycol [facility]) packet 17 g  17 g Oral BID Suzzanne Cloud, MD   17 g at 10/26/22 0981    promethazine (PHENERGAN) 6.5 mg in sodium chloride (NS) 0.9 % 50 mL IVPB  6.5 mg Intravenous Q6H PRN Suzzanne Cloud, MD   Stopped at 10/27/22 0501    pseudoephedrine (SUDAFED) tablet 30 mg  30 mg Oral Daily PRN Suzzanne Cloud, MD        senna Mancel Parsons) tablet 2 tablet  2 tablet Oral Nightly Suzzanne Cloud, MD   2 tablet at 10/26/22 2137    sodium chloride (NS) 0.9 % infusion  10 mL/hr Intravenous Continuous Nat Christen., MD        zinc oxide-cod liver oil (DESITIN 40%) Paste   Topical Daily PRN Arman Filter, MD   Given at 10/13/22 1015      Medications Prior to Admission   Medication Sig Dispense Refill Last Dose    [EXPIRED] buprenorphine-naloxone (SUBOXONE) 2-0.5 mg sublingual film Place 0.25 Film (0.5 mg of buprenorphine total) under the tongue three (3) times a day. Fill on or after: 09/05/22 22 Film 0 09/25/2022    LORazepam (ATIVAN) 0.5 MG tablet Take 1 tablet (0.5 mg total) by mouth Three (3) times a day as needed for anxiety. 20 tablet 0 Unknown    promethazine (PHENERGAN) 12.5 MG tablet Take 1 tablet (12.5 mg total) by mouth every six (6) hours as needed. 20 tablet 0 Unknown acetaminophen (TYLENOL) 500 MG tablet Take 2 tablets (1,000 mg total) by mouth every eight (8) hours.       baclofen (LIORESAL) 5 mg Tab tablet Take 1 tablet (5 mg total) by mouth Three (3) times a day as needed for muscle spasms. 30 tablet 0     cefdinir (OMNICEF) 300 MG capsule Take 1 capsule (300mg ) twice a day on Mon, Wed, Friday       cholecalciferol, vitamin D3 25 mcg, 1,000 units,, 1,000 unit (25 mcg) tablet Take 1 tablet (25 mcg total) by mouth daily. 100 tablet 3     clobetasoL (TEMOVATE) 0.05 % ointment Apply topically two (2) times a day. To stubborn/thick rashes on hands/feet ears until clear/smooth. Restart as needed 60 g 3     diclofenac sodium (VOLTAREN) 1 % gel Apply 2 g topically four (4) times a day as needed for pain.       famotidine (PEPCID) 20 MG tablet Take 1 tablet (20 mg total) by mouth nightly. 90 tablet 3     hydrocortisone (ANUSOL-HC) 2.5 % rectal cream Insert into the rectum two (2) times a day as needed.       lidocaine 4 % patch Place 2 patches on the skin daily for 7 days. Apply to affected area for 12 hours, then remove for 12 hours. 15 patch 0     LORazepam (ATIVAN) 0.5 MG tablet Take 1 tablet (0.5 mg total) by mouth Three (3) times a day as needed for anxiety. 270 tablet 2  mirtazapine (REMERON) 15 MG tablet Take  1 tablet by mouth (15mg ) at night with 7.5mg  tablet to make 22.5mg  nightly 30 tablet 0     naloxone (NARCAN) 4 mg nasal spray One spray in either nostril once for known/suspected opioid overdose. May repeat every 2-3 minutes in alternating nostril til EMS arrives 2 each 0     nirogacestat (OGSIVEO) 50 mg tablet Take 3 tablets (150 mg total) by mouth two (2) times a day. Swallow tablets whole; do not break, crush, or chew. 180 tablet 0     [EXPIRED] ondansetron (ZOFRAN-ODT) 4 MG disintegrating tablet Dissolve 1 tablet (4 mg total) by mouth every six (6) hours as needed. 20 tablet 0     oxyCODONE (ROXICODONE) 10 mg immediate release tablet Take 1 tablet (10 mg total) by mouth Three (3) times a day as needed for pain. Fill on or after: 09/08/22 90 tablet 0     pimecrolimus (ELIDEL) 1 % cream Apply topically two (2) times a day as needed. To face as needed for rash       triamcinolone (KENALOG) 0.1 % cream Apply topically two (2) times a day as needed. Apply to rash as needed       zinc oxide 10 % Crea Apply 1 application. topically daily as needed.          Past Medical History:   Diagnosis Date    Abdominal pain     Acid reflux     occas    Anesthesia complication     itching, shaking, coldness; last few surgeries have gone much better    Cancer (CMS-HCC)     Cataract of right eye     COVID-19 virus infection 01/2019    Cyst of thyroid determined by ultrasound     monitoring    [name]     2 right forearm, 1 left thigh, 1 right scapula, 1 under left clavicle; multiple    Difficult intravenous access     [name] (familial adenomatous polyposis)     [name]     Gastric polyps     History of chemotherapy     last treatment approx 05/2019    History of colon polyps     History of COVID-19 01/2019    Iron deficiency anemia due to chronic blood loss     received iron infusion 11-2019    PONV (postoperative nausea and vomiting)     Rectal bleeding     Syncopal episodes     especially if becoming dehydrated       Past Surgical History:   Procedure Laterality Date    COLON SURGERY      cyroablation      cystis removal      desmoid removal      PR CLOSE ENTEROSTOMY,RESEC+ANAST N/A 10/09/2020    Procedure: ILEOSTOMY TAKEDOWN;  Surgeon: Mickle Asper, MD;  Location: OR [place];  Service: General Surgery    PR COLONOSCOPY W/BIOPSY SINGLE/MULTIPLE N/A 10/27/2012    Procedure: COLONOSCOPY, FLEXIBLE, PROXIMAL TO SPLENIC FLEXURE; WITH BIOPSY, SINGLE OR MULTIPLE;  Surgeon: Shirlyn Goltz Mir, MD;  Location: PEDS PROCEDURE ROOM [place];  Service: Gastroenterology    PR COLONOSCOPY W/BIOPSY SINGLE/MULTIPLE N/A 09/14/2013    Procedure: COLONOSCOPY, FLEXIBLE, PROXIMAL TO SPLENIC FLEXURE; WITH BIOPSY, SINGLE OR MULTIPLE;  Surgeon: Shirlyn Goltz Mir, MD;  Location: PEDS PROCEDURE ROOM [facility];  Service: Gastroenterology    PR COLONOSCOPY W/BIOPSY SINGLE/MULTIPLE N/A 11/08/2014    Procedure: COLONOSCOPY, FLEXIBLE, PROXIMAL TO SPLENIC  FLEXURE; WITH BIOPSY, SINGLE OR MULTIPLE;  Surgeon: Arnold Long Mir, MD;  Location: PEDS PROCEDURE ROOM [facility];  Service: Gastroenterology    PR COLONOSCOPY W/BIOPSY SINGLE/MULTIPLE N/A 12/26/2015    Procedure: COLONOSCOPY, FLEXIBLE, PROXIMAL TO SPLENIC FLEXURE; WITH BIOPSY, SINGLE OR MULTIPLE;  Surgeon: Arnold Long Mir, MD;  Location: PEDS PROCEDURE ROOM [facility];  Service: Gastroenterology    PR COLONOSCOPY W/BIOPSY SINGLE/MULTIPLE N/A 09/02/2017    Procedure: COLONOSCOPY, FLEXIBLE, PROXIMAL TO SPLENIC FLEXURE; WITH BIOPSY, SINGLE OR MULTIPLE;  Surgeon: Arnold Long Mir, MD;  Location: PEDS PROCEDURE ROOM [place];  Service: Gastroenterology    PR COLSC FLX W/REMOVAL LESION BY HOT BX FORCEPS N/A 08/27/2016    Procedure: COLONOSCOPY, FLEXIBLE, PROXIMAL TO SPLENIC FLEXURE; W/REMOVAL TUMOR/POLYP/OTHER LESION, HOT BX FORCEP/CAUTE;  Surgeon: Arnold Long Mir, MD;  Location: PEDS PROCEDURE ROOM [facility];  Service: Gastroenterology    PR COLSC FLX W/RMVL OF TUMOR POLYP LESION SNARE TQ N/A 02/25/2019    Procedure: COLONOSCOPY FLEX; W/REMOV TUMOR/LES BY SNARE;  Surgeon: Helyn Numbers, MD;  [facility] [place] [facility];  Service: Gastroenterology    PR COLSC FLX W/RMVL OF TUMOR POLYP LESION SNARE TQ N/A 03/13/2020    Procedure: COLONOSCOPY FLEX; W/REMOV TUMOR/LES BY SNARE;  Surgeon: Helyn Numbers, MD;  Location: [facility] [place] [facility];  Service: Gastroenterology    PR EXC SKIN BENIG 2.1-3 CM TRUNK,ARM,LEG Right 02/25/2017    Procedure: EXCISION, BENIGN LESION INCLUDE MARGINS, EXCEPT SKIN TAG, LEGS; EXCISED DIAMETER 2.1 TO 3.0 CM;  Surgeon: Clarene Duke, MD;  Location: CHILDRENS OR [facility];  Service: Plastics    PR EXC SKIN BENIG 3.1-4 CM TRUNK,ARM,LEG Right 02/25/2017 Procedure: EXCISION, BENIGN LESION INCLUDE MARGINS, EXCEPT SKIN TAG, ARMS; EXCISED DIAMETER 3.1 TO 4.0 CM;  Surgeon: Clarene Duke, MD;  Location: CHILDRENS OR [facility];  Service: Plastics    PR EXC SKIN BENIG >4 CM FACE,FACIAL Right 02/25/2017    Procedure: EXCISION, OTHER BENIGN LES INCLUD MARGIN, FACE/EARS/EYELIDS/NOSE/LIPS/MUCOUS MEMBRANE; EXCISED DIAM >4.0 CM;  Surgeon: Clarene Duke, MD;  Location: CHILDRENS OR [facility];  Service: Plastics    PR EXC TUMOR SOFT TISSUE LEG/ANKLE SUBQ 3+CM Right 08/05/2019    Procedure: EXCISION, TUMOR, SOFT TISSUE OF LEG OR ANKLE AREA, SUBCUTANEOUS; 3 CM OR GREATER;  Surgeon: Arsenio Katz, MD;  Location: MAIN OR [place];  Service: Plastics    PR EXC TUMOR SOFT TISSUE LEG/ANKLE SUBQ <3CM Right 08/05/2019    Procedure: EXCISION, TUMOR, SOFT TISSUE OF LEG OR ANKLE AREA, SUBCUTANEOUS; LESS THAN 3 CM;  Surgeon: Arsenio Katz, MD;  Location: MAIN OR [facility];  Service: Plastics    PR LAP, SURG PROCTECTOMY W J-POUCH N/A 08/10/2020    Procedure: ROBOTIC ASSISTED LAPAROSCOPIC PROCTOCOLECTOMY, ILEAL J POUCH, WITH OSTOMY;  Surgeon: Mickle Asper, MD;  Location: OR [place];  Service: General Surgery    PR NDSC EVAL INTSTINAL POUCH DX W/COLLJ SPEC SPX N/A 01/23/2021    Procedure: ENDO EVAL SM INTEST POUCH; DX;  Surgeon: Modena Nunnery, MD;  [facility] [place] [facility];  Service: Gastroenterology    PR NDSC EVAL INTSTINAL POUCH DX W/COLLJ SPEC SPX N/A 08/27/2021    Procedure: ENDO EVAL SM INTEST POUCH; DX;  Surgeon: Hunt Oris, MD;  [facility]: [facility]URES [facility];  Service: Gastroenterology    PR NDSC EVAL INTSTINAL POUCH DX W/COLLJ SPEC SPX N/A 12/09/2021    Procedure: ENDO EVAL SM INTEST POUCH; DX;  Surgeon: Vidal Schwalbe, MD;  Location: GI PROCEDURES [facility];  Service: Gastroenterology  PR NDSC EVAL INTSTINAL POUCH DX W/COLLJ SPEC SPX Left 04/09/2022    Procedure: ENDO EVAL SM INTEST POUCH; DX;  Surgeon: Modena Nunnery, MD;  [facility]: [facility] [place] [facility];  Service: Gastroenterology    PR NDSC EVAL INTSTINAL POUCH DX W/COLLJ SPEC SPX N/A 08/05/2022    Procedure: ENDO EVAL SM INTEST POUCH; DX;  Surgeon: Modena Nunnery, MD;  Location: GI PROCEDURES [facility];  Service: Gastroenterology    PR NDSC EVAL INTSTINAL POUCH W/BX SINGLE/MULTIPLE N/A 01/20/2022    Procedure: ENDOSCOPIC EVAL OF SMALL INTESTINAL POUCH; DIAGNOSTIC, No biopsies;  Surgeon: Andrey Farmer, MD;  Location: GI PROCEDURES [facility];  Service: Gastroenterology    PR NDSC EVAL INTSTINAL POUCH W/BX SINGLE/MULTIPLE N/A 02/13/2022    Procedure: ENDOSCOPIC EVAL OF SMALL INTESTINAL POUCH; DIAGNOSTIC, WITH BIOPSY;  Surgeon: Bronson Curb, MD;  Location: GI PROCEDURES [facility];  Service: Gastroenterology    PR UNLISTED PROCEDURE SMALL INTESTINE  01/23/2021    Procedure: UNLISTED PROCEDURE, SMALL INTESTINE;  Surgeon: Modena Nunnery, MD;  [facility] [place] [facility];  Service: Gastroenterology    PR UNLISTED PROCEDURE SMALL INTESTINE  02/13/2022    Procedure: UNLISTED PROCEDURE, SMALL INTESTINE;  Surgeon: Bronson Curb, MD;  Location: GI PROCEDURES [facility];  Service: Gastroenterology    PR UPPER GI ENDOSCOPY,BIOPSY N/A 10/27/2012    Procedure: UGI ENDOSCOPY; WITH BIOPSY, SINGLE OR MULTIPLE;  Surgeon: Shirlyn Goltz Mir, MD;  Location: PEDS PROCEDURE ROOM [facility];  Service: Gastroenterology    PR UPPER GI ENDOSCOPY,BIOPSY N/A 09/14/2013    Procedure: UGI ENDOSCOPY; WITH BIOPSY, SINGLE OR MULTIPLE;  Surgeon: Shirlyn Goltz Mir, MD;  Location: PEDS PROCEDURE ROOM [facility];  Service: Gastroenterology    PR UPPER GI ENDOSCOPY,BIOPSY N/A 11/08/2014    Procedure: UGI ENDOSCOPY; WITH BIOPSY, SINGLE OR MULTIPLE;  Surgeon: Arnold Long Mir, MD;  Location: PEDS PROCEDURE ROOM [facility];  Service: Gastroenterology    PR UPPER GI ENDOSCOPY,BIOPSY N/A 12/26/2015    Procedure: UGI ENDOSCOPY; WITH BIOPSY, SINGLE OR MULTIPLE;  Surgeon: Arnold Long Mir, MD;  Location: PEDS PROCEDURE ROOM [facility];  Service: Gastroenterology    PR UPPER GI ENDOSCOPY,BIOPSY N/A 08/27/2016    Procedure: UGI ENDOSCOPY; WITH BIOPSY, SINGLE OR MULTIPLE;  Surgeon: Arnold Long Mir, MD;  Location: PEDS PROCEDURE ROOM [facility];  Service: Gastroenterology    PR UPPER GI ENDOSCOPY,BIOPSY N/A 09/02/2017    Procedure: UGI ENDOSCOPY; WITH BIOPSY, SINGLE OR MULTIPLE;  Surgeon: Arnold Long Mir, MD;  Location: PEDS PROCEDURE ROOM [facility];  Service: Gastroenterology    PR UPPER GI ENDOSCOPY,BIOPSY N/A 03/13/2020    Procedure: UGI ENDOSCOPY; WITH BIOPSY, SINGLE OR MULTIPLE;  Surgeon: Helyn Numbers, MD;  [facility] [place] [facility];  Service: Gastroenterology    PR UPPER GI ENDOSCOPY,BIOPSY N/A 09/05/2021    Procedure: UGI ENDOSCOPY; WITH BIOPSY, SINGLE OR MULTIPLE;  Surgeon: Wendall Papa, MD;  Location: GI PROCEDURES [facility];  Service: Gastroenterology    PR UPPER GI ENDOSCOPY,DIAGNOSIS N/A 01/20/2022    Procedure: UGI ENDO, INCLUDE ESOPHAGUS, STOMACH, & DUODENUM &/OR JEJUNUM; DX W/WO COLLECTION SPECIMN, BY BRUSH OR WASH;  Surgeon: Andrey Farmer, MD;  Location: GI PROCEDURES [facility];  Service: Gastroenterology    TUMOR REMOVAL      multiple-head, neck, back, hand, right flank, multiple       Social History     Socioeconomic History    Marital status: Single     Spouse name: None    Number of children: None    Years  of education: None    Highest education level: None   Tobacco Use    Smoking status: Never     Passive exposure: Past    Smokeless tobacco: Never   Vaping Use    Vaping status: Never Used   Substance and Sexual Activity    Alcohol use: Never    Drug use: Never    Sexual activity: Never   Other Topics Concern    Do you use sunscreen? Yes    Tanning bed use? No    Are you easily burned? No    Excessive sun exposure? No    Blistering sunburns? 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     Social Determinants of Health     Financial Resource Strain: Low Risk  (09/26/2022)    Overall Financial Resource Strain (CARDIA)     Difficulty of Paying Living Expenses: Not very hard   Food Insecurity: No Food Insecurity (09/26/2022)    Hunger Vital Sign     Worried About Running Out of Food in the Last Year: Never true     Ran Out of Food in the Last Year: Never true   Transportation Needs: No Transportation Needs (09/26/2022)    PRAPARE - Therapist, art (Medical): No     Lack of Transportation (Non-Medical): No     Family History   Problem Relation Age of Onset    No Known Problems Mother     No Known Problems Father     No Known Problems Sister     No Known Problems Brother     Stroke Maternal Grandmother     Other Maternal Grandmother         benign lesions of liver and pancreas, further details unknown    Cancer Maternal Grandmother     Diabetes Maternal Grandmother     Hypertension Maternal Grandmother     Thyroid disease Maternal Grandmother     Arthritis Maternal Grandfather     Asthma Maternal Grandfather     COPD Paternal Grandmother         Deceased    Miscarriages / Stillbirths Paternal Grandmother     Alcohol abuse Paternal Grandfather         Deceased    No Known Problems Maternal Aunt     No Known Problems Maternal Uncle     No Known Problems Paternal Aunt     No Known Problems Paternal Uncle     Anesthesia problems Neg Hx     Broken bones Neg Hx     Cancer Neg Hx     Clotting disorder Neg Hx     Collagen disease Neg Hx     Diabetes Neg Hx     Dislocations Neg Hx     Fibromyalgia Neg Hx     Gout Neg Hx     Hemophilia Neg Hx     Osteoporosis Neg Hx     Rheumatologic disease Neg Hx     Scoliosis Neg Hx     Severe sprains Neg Hx     Sickle cell anemia Neg Hx     Spinal Compression Fracture Neg Hx     Melanoma Neg Hx     Basal cell carcinoma Neg Hx     Squamous cell carcinoma Neg Hx        Code Status: Full  Code     Review of Systems     A 10-system review of systems was conducted and was negative except as documented above in the HPI.       Objective        Temp:  [36.6 ??C (97.8 ??F)-37 ??C (98.6 ??F)] 36.6 ??C (97.9 ??F)  Heart Rate:  [71-94] 71  SpO2 Pulse:  [71-95] 71  Resp:  [12-23] 12  BP: (100-139)/(45-86) 100/45  MAP (mmHg):  [63-96] 63  SpO2:  [98 %-100 %] 98 %  No intake/output data recorded.    Physical Exam:  General Appearance:Chronically ill appearing. In no acute distress.  HEENT: Head is atraumatic and normocephalic. Sclera anicteric without injection. Oropharyngeal membranes are moist with no erythema or exudate.  Neck: Deferred.  Lungs:  Normal work of breathing  Heart:  Tachycardiac  Abdomen: Nondistended.  Extremities: No clubbing, cyanosis, or edema.    Neurological Examination:     Mental Status: Alert, conversant, able to follow conversation and interview. Spontaneous speech was fluent without word finding pauses, dysarthria, or paraphasic errors. Comprehension was intact to simple and multi-step commands. Memory for recent and remote events was intact.    Cranials - intact, chronic mild right eye exotropia  Motor -   Normal tone in BUE. No pronator drift. Slow finger taps on right (chronic)   Direct testing - UE R/L: Delt 5/5, Bi 4/5, HG 4/5 (chronic)  Direct testing - LE R/L: HF 2/3, KF/KE unable to assess, DF 1/1, PF 2/2                When walked with walker, stands for a few moments with arms by sides, without relying on walker.   Legs rigid with legs resting on recliner chair. Legs follow foot rest down without rigidity when pushed down.   Ankles and toes stiffly plantarflexed on direct testing, a few minutes later in neutral toe and ankle position.  Sensation -      Intact LT, vibration, and PP in BUE and BLE  Reduced cold sensation below knees bilaterally (50-60% range).  Patch of reduced sensation right 4th and 5th digits (chronic) with + tinel at right elbow.  Reflex - bi 2/1, br 3/1, patella 2/2, aj 2/2, no clonus.hoffman negative, toes downgoing.  Cerebellar - normal FTN bilaterally  Gait - ambulates with walker. Maintains posture for long period of time when rising from sitting to standing and vice versa. Lets go of walker handles and maintains posture without falling. Reports difficulty turning feet and turns feet slowly.     Diagnostic Studies      All Labs Last 24hrs:   Recent Results (from the past 24 hour(s))   CBC    Collection Time: 10/27/22  6:56 AM   Result Value Ref Range    WBC 6.4 3.6 - 11.2 10*9/L    RBC 3.37 (L) 3.95 - 5.13 10*12/L    HGB 8.9 (L) 11.3 - 14.9 g/dL    HCT 28.4 (L) 13.2 - 44.0 %    MCV 80.8 77.6 - 95.7 fL    MCH 26.4 25.9 - 32.4 pg    MCHC 32.7 32.0 - 36.0 g/dL    RDW 44.0 10.2 - 72.5 %    MPV 10.2 6.8 - 10.7 fL    Platelet 170 150 - 450 10*9/L   Hepatic Function Panel    Collection Time: 10/27/22  6:56 AM   Result Value Ref Range    Albumin 2.9 (L) 3.4 -  5.0 g/dL    Total Protein 5.8 5.7 - 8.2 g/dL    Total Bilirubin 0.3 0.3 - 1.2 mg/dL    Bilirubin, Direct 7.82 0.00 - 0.30 mg/dL    AST 41 (H) <=95 U/L    ALT 187 (H) 10 - 49 U/L    Alkaline Phosphatase 273 (H) 46 - 116 U/L       MRI CTL Spine w/wo, 09/26/2022, personally reviewed:  Mild spinal canal stenosis noted at C4-C6. No abnormal enhancement.

## 2022-10-28 LAB — HEPATIC FUNCTION PANEL
ALBUMIN: 3 g/dL — ABNORMAL LOW (ref 3.4–5.0)
ALKALINE PHOSPHATASE: 245 U/L — ABNORMAL HIGH (ref 46–116)
ALT (SGPT): 136 U/L — ABNORMAL HIGH (ref 10–49)
AST (SGOT): 23 U/L (ref <=34)
BILIRUBIN DIRECT: <0.1 mg/dL (ref 0.00–0.30)
BILIRUBIN TOTAL: 0.2 mg/dL — ABNORMAL LOW (ref 0.3–1.2)
PROTEIN TOTAL: 6.1 g/dL (ref 5.7–8.2)

## 2022-10-28 LAB — CBC
HEMATOCRIT: 27.7 % — ABNORMAL LOW (ref 34.0–44.0)
HEMOGLOBIN: 9.1 g/dL — ABNORMAL LOW (ref 11.3–14.9)
MEAN CORPUSCULAR HEMOGLOBIN CONC: 32.9 g/dL (ref 32.0–36.0)
MEAN CORPUSCULAR HEMOGLOBIN: 26.3 pg (ref 25.9–32.4)
MEAN CORPUSCULAR VOLUME: 80.1 fL (ref 77.6–95.7)
MEAN PLATELET VOLUME: 9.9 fL (ref 6.8–10.7)
PLATELET COUNT: 203 10*9/L (ref 150–450)
RED BLOOD CELL COUNT: 3.46 10*12/L — ABNORMAL LOW (ref 3.95–5.13)
RED CELL DISTRIBUTION WIDTH: 15.6 % — ABNORMAL HIGH (ref 12.2–15.2)
WBC ADJUSTED: 8.4 10*9/L (ref 3.6–11.2)

## 2022-10-28 LAB — MANGANESE: MANGANESE, BLOOD: 1.4 ng/mL — ABNORMAL HIGH

## 2022-10-28 MED ADMIN — cefepime (MAXIPIME) 2 g in sodium chloride 0.9 % (NS) 100 mL IVPB-MBP: 2 g | INTRAVENOUS | @ 08:00:00 | Stop: 2022-10-28

## 2022-10-28 MED ADMIN — promethazine (PHENERGAN) 6.5 mg in sodium chloride (NS) 0.9 % 50 mL IVPB: 6.5 mg | INTRAVENOUS | @ 21:00:00

## 2022-10-28 MED ADMIN — baclofen (LIORESAL) tablet 20 mg: 20 mg | ORAL | @ 18:00:00

## 2022-10-28 MED ADMIN — HYDROmorphone (PF) (DILAUDID) injection 1 mg: 1 mg | INTRAVENOUS | @ 16:00:00 | Stop: 2022-11-04

## 2022-10-28 MED ADMIN — senna (SENOKOT) tablet 2 tablet: 2 | ORAL | @ 02:00:00

## 2022-10-28 MED ADMIN — Propofol (DIPRIVAN) injection: INTRAVENOUS | @ 21:00:00 | Stop: 2022-10-28

## 2022-10-28 MED ADMIN — lactated Ringers infusion: INTRAVENOUS | @ 20:00:00 | Stop: 2022-10-28

## 2022-10-28 MED ADMIN — pantoprazole (Protonix) EC tablet 40 mg: 40 mg | ORAL | @ 02:00:00

## 2022-10-28 MED ADMIN — mirtazapine (REMERON) tablet 15 mg: 15 mg | GASTROENTERAL | @ 04:00:00

## 2022-10-28 MED ADMIN — diclofenac sodium (VOLTAREN) 1 % gel 2 g: 2 g | TOPICAL | @ 02:00:00

## 2022-10-28 MED ADMIN — HYDROmorphone (PF) (DILAUDID) injection 1 mg: 1 mg | INTRAVENOUS | @ 22:00:00 | Stop: 2022-11-04

## 2022-10-28 MED ADMIN — buprenorphine-naloxone (SUBOXONE) 2-0.5 mg SL film 0.5 mg of buprenorphine: .25 | SUBLINGUAL | @ 13:00:00

## 2022-10-28 MED ADMIN — HYDROmorphone (PF) (DILAUDID) injection 1 mg: 1 mg | INTRAVENOUS | @ 08:00:00 | Stop: 2022-11-04

## 2022-10-28 MED ADMIN — cefepime (MAXIPIME) 2 g in sodium chloride 0.9 % (NS) 100 mL IVPB-MBP: 2 g | INTRAVENOUS | @ 16:00:00 | Stop: 2022-10-28

## 2022-10-28 MED ADMIN — dexmedeTOMIDine (Precedex) 400 mcg in sodium chloride 0.9% 100 ml (4 mcg/mL) infusion PMB: INTRAVENOUS | @ 20:00:00 | Stop: 2022-10-28

## 2022-10-28 MED ADMIN — ondansetron (ZOFRAN) injection: INTRAVENOUS | @ 20:00:00 | Stop: 2022-10-28

## 2022-10-28 MED ADMIN — buprenorphine-naloxone (SUBOXONE) 2-0.5 mg SL film 0.5 mg of buprenorphine: .25 | SUBLINGUAL | @ 02:00:00

## 2022-10-28 MED ADMIN — cefepime (MAXIPIME) 2 g in sodium chloride 0.9 % (NS) 100 mL IVPB-MBP: 2 g | INTRAVENOUS | @ 02:00:00 | Stop: 2022-10-28

## 2022-10-28 MED ADMIN — buprenorphine-naloxone (SUBOXONE) 2-0.5 mg SL film 0.5 mg of buprenorphine: .25 | SUBLINGUAL | @ 18:00:00

## 2022-10-28 MED ADMIN — ondansetron (ZOFRAN) injection 4 mg: 4 mg | INTRAVENOUS

## 2022-10-28 MED ADMIN — LORazepam (ATIVAN) injection 1 mg: 1 mg | INTRAVENOUS | @ 22:00:00

## 2022-10-28 MED ADMIN — hydrocortisone 2.5 % cream: TOPICAL | @ 03:00:00

## 2022-10-28 MED ADMIN — ePHEDrine (PF) 25 mg/5 mL (5 mg/mL) in 0.9% sodium chloride syringe Syrg: INTRAVENOUS | @ 21:00:00 | Stop: 2022-10-28

## 2022-10-28 MED ADMIN — sodium chloride (NS) 0.9 % infusion: 100 mL/h | INTRAVENOUS | @ 16:00:00 | Stop: 2022-10-28

## 2022-10-28 MED ADMIN — fentaNYL (PF) (SUBLIMAZE) injection: INTRAVENOUS | @ 20:00:00 | Stop: 2022-10-28

## 2022-10-28 MED ADMIN — pantoprazole (Protonix) EC tablet 40 mg: 40 mg | ORAL | @ 12:00:00 | Stop: 2022-10-28

## 2022-10-28 MED ADMIN — LORazepam (ATIVAN) injection 1 mg: 1 mg | INTRAVENOUS | @ 10:00:00

## 2022-10-28 MED ADMIN — promethazine (PHENERGAN) 6.5 mg in sodium chloride (NS) 0.9 % 50 mL IVPB: 6.5 mg | INTRAVENOUS | @ 04:00:00

## 2022-10-28 MED ADMIN — naloxegol (MOVANTIK) tablet 25 mg: 25 mg | ORAL | @ 12:00:00

## 2022-10-28 MED ADMIN — cetirizine (ZYRTEC) tablet 10 mg: 10 mg | GASTROENTERAL | @ 12:00:00

## 2022-10-28 MED ADMIN — HYDROmorphone (PF) (DILAUDID) injection 1 mg: 1 mg | INTRAVENOUS | Stop: 2022-11-04

## 2022-10-28 MED ADMIN — micafungin (MYCAMINE) 100 mg in sodium chloride 0.9 % (NS) 100 mL IVPB-MBP: 100 mg | INTRAVENOUS | @ 02:00:00 | Stop: 2022-11-05

## 2022-10-28 MED ADMIN — lidocaine (PF) (XYLOCAINE-MPF) 20 mg/mL (2 %) injection: INTRAVENOUS | @ 21:00:00 | Stop: 2022-10-28

## 2022-10-28 MED ADMIN — baclofen (LIORESAL) tablet 20 mg: 20 mg | ORAL | @ 02:00:00

## 2022-10-28 MED ADMIN — HYDROmorphone (PF) (DILAUDID) injection 1 mg: 1 mg | INTRAVENOUS | @ 12:00:00 | Stop: 2022-11-04

## 2022-10-28 MED ADMIN — famotidine (PEPCID) tablet 20 mg: 20 mg | ORAL | @ 02:00:00

## 2022-10-28 MED ADMIN — dexAMETHasone (DECADRON) 4 mg/mL injection: INTRAVENOUS | @ 21:00:00 | Stop: 2022-10-28

## 2022-10-28 MED ADMIN — HYDROmorphone (PF) (DILAUDID) injection: INTRAVENOUS | @ 21:00:00 | Stop: 2022-10-28

## 2022-10-28 MED ADMIN — oxyCODONE (ROXICODONE) immediate release tablet 10 mg: 10 mg | ORAL | @ 02:00:00 | Stop: 2022-10-28

## 2022-10-28 MED ADMIN — HYDROmorphone (PF) (DILAUDID) injection 1 mg: 1 mg | INTRAVENOUS | @ 04:00:00 | Stop: 2022-11-04

## 2022-10-28 MED ADMIN — baclofen (LIORESAL) tablet 20 mg: 20 mg | ORAL | @ 12:00:00

## 2022-10-28 NOTE — Unmapped (Signed)
Pt is A&Ox4, VSS, O2 sats >90% on [place] 2 L/min. Afebrile. Frequent c/o pain and nausea, PRNs given with some relief. UO adequate, one BM this shift. Pt's tolerated tube feeds @20  mL/hr with PO intake attempts before being NPO at midnight. Skin intact, Q2H turns and standard precautions maintained. No falls/injuries this shift. Mother at bedside. All monitors with appropriate alarm settings, call bell within reach, see flowsheets/MAR for further info.          Problem: Adult Inpatient Plan of Care  Goal: Plan of Care Review  10/28/2022 0526 by Lavone Neri, RN  Outcome: Ongoing - Unchanged  10/28/2022 0259 by Lavone Neri, RN  Outcome: Ongoing - Unchanged  Goal: Patient-Specific Goal (Individualized)  10/28/2022 0526 by Lavone Neri, RN  Outcome: Ongoing - Unchanged  10/28/2022 0259 by Lavone Neri, RN  Outcome: Ongoing - Unchanged  Goal: Absence of Hospital-Acquired Illness or Injury  10/28/2022 0526 by Lavone Neri, RN  Outcome: Ongoing - Unchanged  10/28/2022 0259 by Lavone Neri, RN  Outcome: Ongoing - Unchanged  Intervention: Identify and Manage Fall Risk  Recent Flowsheet Documentation  Taken 10/27/2022 2000 by Lavone Neri, RN  Safety Interventions:   aspiration precautions   bleeding precautions   commode/urinal/bedpan at bedside   fall reduction program maintained   family at bedside   infection management   lighting adjusted for tasks/safety   low bed   room near unit station   nonskid shoes/slippers when out of bed   safety attendant   toileting scheduled  Intervention: Prevent Infection  Recent Flowsheet Documentation  Taken 10/27/2022 2000 by Lavone Neri, RN  Infection Prevention:   equipment surfaces disinfected   hand hygiene promoted   personal protective equipment utilized   rest/sleep promoted   single patient room provided  Goal: Optimal Comfort and Wellbeing  10/28/2022 0526 by Lavone Neri, RN  Outcome: Ongoing - Unchanged  10/28/2022 0259 by Lavone Neri, RN  Outcome: Ongoing - Unchanged  Goal: Readiness for Transition of Care  10/28/2022 0526 by Lavone Neri, RN  Outcome: Ongoing - Unchanged  10/28/2022 0259 by Lavone Neri, RN  Outcome: Ongoing - Unchanged  Goal: Rounds/Family Conference  10/28/2022 0526 by Lavone Neri, RN  Outcome: Ongoing - Unchanged  10/28/2022 0259 by Lavone Neri, RN  Outcome: Ongoing - Unchanged     Problem: Latex Allergy  Goal: Absence of Allergy Symptoms  10/28/2022 0526 by Lavone Neri, RN  Outcome: Ongoing - Unchanged  10/28/2022 0259 by Lavone Neri, RN  Outcome: Ongoing - Unchanged  Intervention: Maintain Latex-Aware Environment  Recent Flowsheet Documentation  Taken 10/27/2022 2000 by Lavone Neri, RN  Latex Precautions: latex precautions maintained     Problem: Fall Injury Risk  Goal: Absence of Fall and Fall-Related Injury  10/28/2022 0526 by Lavone Neri, RN  Outcome: Ongoing - Unchanged  10/28/2022 0259 by Lavone Neri, RN  Outcome: Ongoing - Unchanged  Intervention: Promote Injury-Free Environment  Recent Flowsheet Documentation  Taken 10/27/2022 2000 by Lavone Neri, RN  Safety Interventions:   aspiration precautions   bleeding precautions   commode/urinal/bedpan at bedside   fall reduction program maintained   family at bedside   infection management   lighting adjusted for tasks/safety   low bed   room near unit station   nonskid shoes/slippers when out of bed   safety attendant   toileting scheduled     Problem: Skin Injury Risk Increased  Goal: Skin Health and Integrity  10/28/2022 0526 by Lavone Neri, RN  Outcome: Ongoing -  Unchanged  10/28/2022 0259 by Lavone Neri, RN  Outcome: Ongoing - Unchanged  Intervention: Optimize Skin Protection  Recent Flowsheet Documentation  Taken 10/28/2022 0000 by Lavone Neri, RN  Head of Bed [facility]) Positioning: HOB at 30-45 degrees  Taken 10/27/2022 2325 by Lavone Neri, RN  Head of Bed [facility]) Positioning: HOB at 30-45 degrees  Taken 10/27/2022 2000 by Lavone Neri, RN  Head of Bed [facility]) Positioning: HOB at 30-45 degrees     Problem: Self-Care Deficit  Goal: Improved Ability to Complete Activities of Daily Living  10/28/2022 0526 by Lavone Neri, RN  Outcome: Ongoing - Unchanged  10/28/2022 0259 by Lavone Neri, RN  Outcome: Ongoing - Unchanged     Problem: Comorbidity Management  Goal: Blood Glucose Levels Within Targeted Range  10/28/2022 0526 by Lavone Neri, RN  Outcome: Ongoing - Unchanged  10/28/2022 0259 by Lavone Neri, RN  Outcome: Ongoing - Unchanged  Goal: Blood Pressure in Desired Range  10/28/2022 0526 by Lavone Neri, RN  Outcome: Ongoing - Unchanged  10/28/2022 0259 by Lavone Neri, RN  Outcome: Ongoing - Unchanged     Problem: Nausea and Vomiting  Goal: Nausea and Vomiting Relief  10/28/2022 0526 by Lavone Neri, RN  Outcome: Ongoing - Unchanged  10/28/2022 0259 by Lavone Neri, RN  Outcome: Ongoing - Unchanged     Problem: Infection  Goal: Absence of Infection Signs and Symptoms  10/28/2022 0526 by Lavone Neri, RN  Outcome: Ongoing - Unchanged  10/28/2022 0259 by Lavone Neri, RN  Outcome: Ongoing - Unchanged  Intervention: Prevent or Manage Infection  Recent Flowsheet Documentation  Taken 10/27/2022 2000 by Lavone Neri, RN  Infection Management: aseptic technique maintained     Problem: Pain Acute  Goal: Optimal Pain Control and Function  10/28/2022 0526 by Lavone Neri, RN  Outcome: Ongoing - Unchanged  10/28/2022 0259 by Lavone Neri, RN  Outcome: Ongoing - Unchanged     Problem: Malnutrition  Goal: Improved Nutritional Intake  10/28/2022 0526 by Lavone Neri, RN  Outcome: Ongoing - Unchanged  10/28/2022 0259 by Lavone Neri, RN  Outcome: Ongoing - Unchanged

## 2022-10-28 NOTE — Unmapped (Signed)
[facility]  Follow-Up Psychiatry Consult Note      Date of admission: 09/25/2022 10:30 AM  Service Date: October 28, 2022  Primary Team: [facility])  LOS:  LOS: 31 days      Assessment:   [name] [name] is a 22 y.o. 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 and reported past psych history of GAD and MDD, admitted 09/25/2022 10:30 AM for other acute postprocedural pain following injection of desmoid tumor with steroid and anesthetic under ultrasound guidance.  Patient was seen in consultation by request of hospitalist on [facility] for evaluation of anxiety and depression.      [name] presents with symptoms consistent with a diagnosis of GAD and MDD, consistent with her historical diagnoses. Patient's symptoms appear to have worsened in the context of discontinuation of mirtazapine and ongoing medical stressors, including loss of independence and limited movement in achieving her longer term goals (e.g., completing nursing school). They are consistent with past reports of above diagnoses and include depressed mood, sleep disturbance, fatigue, anhedonia, hopelessness and passive thoughts of death, and increased anxiety with panic/anxiety attacks.    Today, patient reports continued feelings of fatigue and a dearth of hope regarding improvement in the near future. Provided supportive psychotherapy. No changes in medications indicated from the psychiatric standpoint today, although will continue to consider combination antidepressant management or further optimization of mirtazapine.     Diagnoses:   Active Hospital problems:  Principal Problem:    Other acute postprocedural pain  Active Problems:    [name]    Intestinal polyps    [name]    Neoplasm related pain    Physical deconditioning    History of colectomy    Intractable abdominal pain    Generalized anxiety disorder with panic attacks    SBO (small bowel obstruction) (CMS-HCC)    Small intestinal bacterial overgrowth (SIBO)       Problems edited/added by me:  No problems updated.    Risk Assessment:  ASQ screening result: not completed    -A full risk assessment was previously performed on 7/31.  Risk assessment remains essentially unchanged.    Current suicide risk: low risk  Current homicide risk: low risk      Recommendations:     Safety and Observation Level:   -- This patient is not currently under IVC. If safety concerns arise, please page psychiatry for an evaluation.    Medications:  -- continue mirtazapine 15mg  q2200 for mood and anxiety  - continue lorazepam 1mg  IV BID for anxiety and nausea (home dose of 0.5mg  TID)    Further Work-up:   -- No further recommendations at this time from a psychiatric standpoint    Behavioral / Environmental:   -- Utilize compassion and acknowledge the patient's experiences while setting clear and realistic expectations for care.     Follow-up:  -- When patient is discharged, please ensure that their AVS includes information about the 25 Suicide & Crisis Lifeline.  -- The patient currently receives mental health care with [facility] and we will attempt to coordinate followup with our psychiatry team on discharge. She is also recently established with a virtual DBT group at [facility] ([employer] LCSW) although it is unclear what the status of this group is  -- We will follow as needed at this time.     Thank you for this consult request. Recommendations have been communicated  to the primary team. Please page [phone]  for any questions or concerns.     Discussed with and seen by Attending, Elnita Maxwell, MD, who agrees with the assessment and plan.    [name], MD      Subjective     Relevant events since last seen by psychiatry: increasing medical concerns, including possible line-associated infection, with TEE scheduled for today    Patient Interview:  On encounter, patient was in bed with mother at bedside. Discussed recent stressors. Patient reports overall decrease in hope regarding medical outcomes, and has been trying to be positive but finds it hard. Discussed recent events that upset her and mother.     ROS:   All systems reviewed as negative/unremarkable aside from the following pertinent positives and negatives: fatigue    Collateral:   - Reviewed medical records in Epic    Relevant Updates to past psychiatric, medical/surgical, family, or social history: n/a    Current Medications:  Scheduled Meds:   baclofen  20 mg Oral TID    bisacodyl  10 mg Rectal BID    buprenorphine-naloxone  0.25 Film Sublingual TID    ceFAZolin  2 g Intravenous Q8H    cetirizine  10 mg Enteral tube: gastric Daily    diclofenac sodium  2 g Topical QID    enoxaparin (LOVENOX) injection  40 mg Subcutaneous Q24H SCH    famotidine  20 mg Oral Nightly    micafungin  100 mg Intravenous Q24H    mirtazapine  15 mg Enteral tube: gastric Nightly    naloxegol  25 mg Oral Daily    [Provider Hold] nirogacestat  50 mg Oral BID    pantoprazole  40 mg Oral BID    polyethylene glycol  17 g Oral BID    senna  2 tablet Oral Nightly     Continuous Infusions:   Chemo Clarification Order      IP okay to treat      sodium chloride      sodium chloride 100 mL/hr (10/28/22 1141)     PRN Meds:.acetaminophen, aluminum-magnesium hydroxide-simethicone, calcium carbonate, Chemo Clarification Order, guaiFENesin, hydrocortisone, HYDROmorphone, IP okay to treat, lidocaine, LORazepam, LORazepam, lidocaine-diphenhydrAMINE-aluminum-magnesium, ondansetron, phenol, polyethylene glycol, promethazine, pseudoephedrine, zinc oxide-cod liver oil      Objective:   Vital signs:   Temp:  [36.6 ??C (97.9 ??F)-36.8 ??C (98.2 ??F)] 36.8 ??C (98.2 ??F)  Heart Rate:  [66-93] 68  SpO2 Pulse:  [66-93] 66  Resp:  [13-19] 19  BP: (95-112)/(46-59) 102/48  MAP (mmHg):  [63-76] 63  SpO2:  [99 %-100 %] 100 %    Physical Exam:  Gen: tired, some distress    Mental Status Exam:  Appearance:  appears stated age and in bed   Attitude:   calm, cooperative   Behavior/Psychomotor:  appropriate eye contact and no abnormal movements   Speech/Language:   normal rate, not pressured, normal volume, normal fluency. normal articulation   Mood:  ???not great???   Affect:  mood congruent and dysthymic   Thought process:  logical, linear, clear, coherent, goal directed   Thought content:    denies thoughts of self-harm. Denies SI, plans, or intent. Denies HI.  No grandiose, self-referential, persecutory, or paranoid delusions noted.   Perceptual disturbances:   denies auditory and visual hallucinations   Attention:  able to attend to interview without fluctuations in consciousness   Concentration:  Able to fully concentrate and attend   Orientation:  grossly oriented.  Memory:  not formally tested, but grossly intact   Fund of knowledge:   not formally assessed   Insight:    Intact   Judgment:   Intact   Impulse Control:  Intact     Relevant laboratory/imaging data was reviewed.    Additional Psychometric Testing:  Not applicable.    Consult Type and Time-Based Documentation:  This patient was evaluated in person.    Time-based billing disclaimer:  I personally spent 30   minutes face-to-face and non-face-to-face in the care of this patient, which includes all pre, intra, and post visit time on the date of service.  All documented time was specific to the E/M visit and does not include any procedures that may have been performed.

## 2022-10-28 NOTE — Unmapped (Signed)
Division of Infectious Diseases  General Inpatient Consultation Service       For questions about this consult, page [phone] (Gen A Follow-up Pager).    [name] [name] is being seen in consultation at the request of [name] Furl, MD for evaluation and management of candida fungemia and GNR bacteremia.     PLAN FOR 10/28/2022    Diagnostic  Continue to monitor LFTs , if worsening please obtain a liver ultrasound.  Awaiting TEE under GA planned for today  Follow up blood cultures sent 8/17 to completion  Monitor for antimicrobial toxicity with the following:  CBC w/diff at least once per week  clinical assessments for rashes or other skin changes  CMP daily for now    Treatment  STOP Cefepime.  START on Cefazolin, based on GNR susceptibilities.Dose as per renal function.  Continue on Micafungin 100mg  daily, awaiting susceptibility of candida albicans, if sensitive to Fluconazole recommend changing to Fluconazole.  Patient preference is on nystatin mouth wash.  Duration of therapy = TBD  start date = 10/21/22  end date = TBD    I discussed the plans for today with family/caregiver(s) and patient on 10/28/2022.    Our service will continue to follow.    I personally spent 45 mins face-to-face and non-face-to-face in the care of this patient on 10/28/2022, which includes all pre, intra, and post visit time on the date of service.  All documented time was specific to the E/M visit and does not include any procedures that may have been performed.    [name] [name], MBBS  [place] Division of Infectious Diseases                MDM and Problem-Specific Assessments  ( .00ID2DAY  /  .09WJXBJYNWGN  /  .IDSS  / .[name] )     22 y.o. 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 Subsequently found to have Candidemia and GNR bacteremia, likely due to TPN/CVC, now s/p RIJ removal on 8/14. TTE with possible vegetations in right atrium, awaiting TEE under general anesthesia. With fluctuating LFTs of unclear etiology, monitoring closely for now, if they jump significantly may adjust antimicrobial regimen.    October 28, 2022   Patient has: []  acute illness w/systemic sxs  [mod] [x]  illness posing risk to life or function  [high]   I reviewed:   (3+) [x]  primary team note [x]  consultant note(s) []  procedure/op note(s) [x]  micro result(s)    [x]  CBC results [x]  chemistry results []  radiology report(s) []  nursing note(s)   I independently visualized:   (any)   []  cxs/plates in lab []  plain film images []  CT images []  PET images    []  path slide(s) []  ECG tracing []  MRI images []  nuclear scan   I discussed: (any) [x]  micro and/or path w/lab personnel []  drug options and/or interactions w/ID pharmD    []  procedure/OR findings w/other MD(s) []  echo and/or imaging w/other MD(s)    []  mgm't w/attending(s) involved in case []  setting up home abx w/OPAT team   Mgm't requires: []  prescription drug(s)  [mod] [x]  intensive toxicity monitoring  [high]     Interval notes & result reports show: Afebrile. VSS on 2L [place]. No acute events overnight. TEE delayed to today.    Patient reports: Chronic nausea and abdominal pain.Afebrile.  My interpretation of the data I reviewed is: WBC 8.4, WNL. AST/ALT 23/136. 8/17 Bcx remain NG. No new imaging.    # GNR bacteremia  - acute, poses threat to life or bodily function  [high]  #Candida albicans fungemia - acute, poses threat to life or bodily function  [high]  Patient admitted since 7/18 with course c/b worsening RLQ abdominal pain in setting of new recurrent fevers and tachycardia. Of note, R IJ for TPN present since 01/2022. Blood cultures on 8/13 grew 1/4 GNR, lactose fermenter, family Enterobacteriaceae, blood culture also growing yeast (Candida albicans).  TTE (8/15) demonstrated mobile echodensity in right atrium, cannot rule out vegetation. Plan is for TEE for additional evaluation. With fluctuating LFTs, unclear etiology, for now continuing cefazolin and micafungin and monitoring closely  Management is as per the blue box above.    # Background of Gardner's syndrome, desmoid tumors-  chronic, exacerbation / progression / sfx of tx  [mod]  Diagnosed around the age of 1.  Nirogacestat currently on hold.    # Circumferential bladder wall thickening on CT from 10/22/2022 - acute, stable  [low]  Ua from 10/22/2022 positive.  Management is as per the blue box above.    # Muscle rigidity, increased tone and spasms   - acute, systemic symptoms  [mod]  09/25/2022 had injection of desmoid tumors with steroid/anesthetic under US guidance. For desmoid tumors in right forearm and left chest wall. Had multiple injections like these in the past without complications.  Admitted on 7.19 for observations , her workup included MRI spine and CK which were unremarkable.  Management is as per the blue box above.    # Management of prescription antimicrobials needing intensive toxicity monitoring - acute, poses threat to life or bodily function  [high]  Cefepime can cause various types of neurotoxicity, including depressed consciousness, encephalopathy, aphasia, myoclonus, seizures, and coma. Echinocandin antigfungals can cause phlebitis, skin rashes, N/V/D, hepatotoxicity, neutropenia, thrombocytopenia, and/or infusion-related flushing, pruritis, or fever.   See recommendations in blue box above.    # Disposition  TBD            Antimicrobials & Other Medications  ( .00IDGANTT  /  .00IDGANTTLIST  )     Current  Cefepime 8/13-  Micafungin 8/14-    Previous  Metronidazole 8/13-8/16  Vancomycin 8/13-8/15    Immunomodulators and antipyretics  None       Current Medications as of 10/28/2022  Scheduled  PRN   baclofen, 20 mg, TID  bisacodyl, 10 mg, BID  buprenorphine-naloxone, 0.25 Film, TID  cefepime, 2 g, Q8H  cetirizine, 10 mg, Daily  diclofenac sodium, 2 g, QID  enoxaparin (LOVENOX) injection, 40 mg, Q24H SCH  famotidine, 20 mg, Nightly  micafungin, 100 mg, Q24H  mirtazapine, 15 mg, Nightly  naloxegol, 25 mg, Daily  [Provider Hold] nirogacestat, 50 mg, BID  pantoprazole, 40 mg, BID  polyethylene glycol, 17 g, BID  senna, 2 tablet, Nightly      acetaminophen, 650 mg, Q6H PRN  aluminum-magnesium hydroxide-simethicone, 60 mL, Q6H PRN  calcium carbonate, 400 mg elem calcium, Daily PRN  Chemo Clarification Order, , Continuous PRN  guaiFENesin, 200 mg, Q4H PRN  hydrocortisone, , BID PRN  HYDROmorphone, 1 mg, Q3H PRN  IP okay to treat, , Continuous PRN  lidocaine, , TID PRN  LORazepam, 1 mg, BID PRN  LORazepam, 0.5 mg, TID PRN  lidocaine-diphenhydrAMINE-aluminum-magnesium, 10 mL, QID PRN  ondansetron, 4 mg, Q12H PRN  phenol, 2 spray, Q2H PRN  polyethylene glycol, 17 g, Daily PRN  promethazine, 6.5 mg, Q6H PRN  pseudoephedrine, 30 mg, Daily PRN  zinc oxide-cod liver oil, , Daily PRN           Physical Exam     Temp:  [36.6 ??C (97.9 ??F)-36.8 ??C (98.2 ??F)] 36.8 ??C (98.2 ??F)  Heart Rate:  [66-93] 68  SpO2 Pulse:  [66-93] 66  Resp:  [13-19] 19  BP: (95-119)/(46-60) 102/48  MAP (mmHg):  [63-77] 63  SpO2:  [99 %-100 %] 100 %    Actual body weight: 54 kg (119 lb)  Ideal body weight: 57 kg (125 lb 10.6 oz)      Const [x]  vital signs above      []  WDWN, NAD, non-toxic appearance  [x]  Uncomfortable appearing, NAD      Eyes   []  Lids normal bilaterally, conjunctiva anicteric and noninjected OU  []  PERRL   []        ENMT     [x]  Normal appearance of external nose and ears       [x]  No visible thrush  []  MMM, no lesions on lips or gums, dentition good        []  Hearing normal   [x]  NG tube in place.      Neck    []  Neck of normal appearance and trachea midline        []  No thyromegaly, nodules, or tenderness   [x]  RIJ central line in place      Lymph    []  No LAD in neck       []  No LAD in supraclavicular area       []  No LAD in axillae   []  No LAD in epitrochlear chains       []  No LAD in inguinal areas  []        CV    [x]  RRR, no m/r/g, S1/S2       []  No peripheral edema, WWP       []  Pedal pulses intact   []        Resp   [x]  Normal WOB       []  CTAB   []        GI   []  Normal inspection, NTND, NABS       []  No umbilical hernia on exam       []  No hepatosplenomegaly       []  Inspection of perineal and perianal areas normal  [x]  Diffusely tender to palpation      GU   []  Normal external genitalia       [x]  Deferred.      MSK   []  No clubbing or cyanosis of hands       []  No focal tenderness or abnormalities on palpation of joints in RUE, LUE, RLE, or LLE  []        Skin   []  No rashes, lesions, or ulcers of visualized skin       [x]  Skin warm and dry to palpation   []        Neuro   []  CNs II-XII grossly intact       []  Sensation to light touch grossly intact throughout   []  DTRs normal and symmetric throughout   [x]  Unable to assess due to critical illness, sedation, or mental status  []        Psych   [x]  Appropriate affect      [x]  Oriented to person, place, time      [  x] Judgment and insight are appropriate   []  Unable to assess due to critical illness, sedation, or mental status  []           Patient Lines/Drains/Airways Status       Active Active Lines, Drains, & Airways       Name Placement date Placement time Site Days    CVC Triple Lumen 10/22/22 Non-tunneled Right Internal jugular 10/22/22  1521  Internal jugular  5    NG/OG Tube Decompression Right nostril 09/26/22  1000  Right nostril  32                      Data for ID Decision Making  ( IDGENCONMDM )       Micro & Serological Data   ( RSLTMICRO  /  [id number]  /  00CXSRC  /  00CXRES  /  00CXSUSC )    Microbiology Results (last day)       Procedure Component Value Date/Time Date/Time    Blood Culture #1 [[id number]]  (Normal) Collected: 10/25/22 0609    Lab Status: Preliminary result Specimen: Blood from 1 Peripheral Draw Updated: 10/28/22 0630     Blood Culture, Routine No Growth at 72 hours    Blood Culture #2 [[id number]]  (Normal) Collected: 10/25/22 0610    Lab Status: Preliminary result Specimen: Blood from 1 Peripheral Draw Updated: 10/28/22 0630     Blood Culture, Routine No Growth at 72 hours              7/19 Bcx NGTD  8/13 Bcx 1/2 GNR, family enterobacteriaceae (R-ampi; S-cefazolin, cipro, gent, levo, pip+tazo), C.albicans  8/13 Bcx 2/2 NGTD  8/15 Ux <10,000 CFU/mL Candida albicans  8/17 Bcx 2/2 NGTD    Recent Studies  ( RISRSLT )    No results found.          Initial Consult Documentation from October 22, 2022     Sources of information include: chart review, patient, and family/friend(s).    History of Present Illness:     22 y.o. 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    Patient has been admitted to [facility] since 7/18 with chronic abdominal pain and restarted on TPN in the setting of low PO intake. Patient was noted with chronic abdominal pain and nausea that is secondary to desmoid tumors and prior surgeries. More recently on 8/11, patient was noted with peristalsis in right lower quadrant. She was last noted with bowel movement on 8/11. Since, she has had worsening abdominal pain. Overnight of 8/12, patient was febrile to 38.7 despite tylenol and tachycardic to 140s. Since, patient has been having recurrent fevers and tachycardic with no leukocytosis. CTAP (8/13) was unremarkable for acute processes and demonstrated no small bowel obstruction. Blood cultures on 8/13 grew 1/2 GNR. Patient was started on cefepime, vancomycin, and metronidazole on 8/13. Right IJ was removed on 8/14.    Of note, pouchoscopy on 02/2022 showed 1cm of circumferential ulceration with severe, active pouchitis which improved ~2 months later. Repeat pouchoscopy on 07/16/22 demonstrated small ulcerations on suture line and a rectal cuff beginning 1cm from anal verge and extending 2cm with healthy appearing mucosa. Patient has been on TPN since 09/28/22 through nontunneled right IJ that has been present since 01/2022.    Past Medical  History   Patient  has a past medical history of Abdominal pain, Acid reflux, Anesthesia complication, Cancer (CMS-HCC), Cataract of right eye, COVID-19 virus infection (01/2019), Cyst of thyroid determined by ultrasound, [name], Difficult intravenous access, [name] (familial adenomatous polyposis), [name], Gastric polyps, History of chemotherapy, History of colon polyps, History of COVID-19 (01/2019), Iron deficiency anemia due to chronic blood loss, PONV (postoperative nausea and vomiting), Rectal bleeding, and Syncopal episodes.      Meds and Allergies  Patient has a current medication list which includes the following prescription(s): lorazepam, promethazine, acetaminophen, baclofen, cefdinir, cholecalciferol (vitamin d3 25 mcg (1,000 units)), clobetasol, diclofenac sodium, doxycycline, famotidine, hydrocortisone, lidocaine, lorazepam, mirtazapine, naloxone, nirogacestat, oxycodone, pimecrolimus, triamcinolone, and zinc oxide, and the following Facility-Administered Medications: acetaminophen, aluminum-magnesium hydroxide-simethicone, baclofen, bisacodyl, buprenorphine-naloxone, calcium carbonate, cefepime (MAXIPIME) 2 g in sodium chloride 0.9 % (NS) 100 mL IVPB-MBP, cetirizine, CHEMO CLARIFICATION ORDER, diclofenac sodium, enoxaparin, famotidine, guaifenesin, hydrocortisone, hydromorphone (pf), IP OKAY TO TREAT, lidocaine, lorazepam, lorazepam, micafungin (MYCAMINE) 100 mg in sodium chloride 0.9 % (NS) 100 mL IVPB-MBP, mirtazapine, lidocaine-diphenhydramine-aluminum-magnesium, naloxegol, [Provider Hold] nirogacestat, ondansetron, pantoprazole, phenol, polyethylene glycol, polyethylene glycol, promethazine (PHENERGAN) 6.5 mg in sodium chloride (NS) 0.9 % 50 mL IVPB, pseudoephedrine, senna, sodium chloride, and zinc oxide-cod liver oil.    Allergies: Adhesive tape-silicones; Ferrlecit [sodium ferric gluconat-sucrose]; Levofloxacin; Methylnaltrexone; Neomycin; Papaya; Morphine; Zosyn [piperacillin-tazobactam]; Compazine [prochlorperazine]; Iron analogues; Iron dextran; and Latex, natural rubber       Social History  Patient  reports that she has never smoked. She has been exposed to tobacco smoke. She has never used smokeless tobacco. She reports that she does not drink alcohol and does not use drugs.       Scribe's Attest:  [name] [name], MBBCh obtained and performed the history, physical exam and medical decision making elements that were entered into the chart. Documentation assistance was provided by me personally. Signed by Paulina Fusi, Scribe, on October 28, 2022 at 10:17 AM.     Provider???s Attestation:   Documentation assistance provided by the Scribe, Paulina Fusi. I was present during the time the encounter was [place], MBBCh. October 28, 2022 at 10:17 AM

## 2022-10-28 NOTE — Unmapped (Signed)
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 She was put back on TPN and remained admitted due to abdominal pain and debilitating muscle spasms/rigidity of uncertain etiology. Hospital course now further complicated by bacteremia/fungemia.     GNR bacteremia/Candida fungemia 8/13 : Source now presumably her central line that she was getting TPN through. She is no longer having fevers and is clinically slowly improving . Central line exchanged 8/14  - Consulted ID appreciate recs  - Follow-up repeat cultures  - Continue Cefazolin and micafungin  - TTE abnormal so awaiting TEE under general anesthesia today     Chronic abdominal pain, nausea 2/2 desmoid tumors and prior surgeries: Continues to have significant nausea with inadequate intake  - Continue scheduled bowel regimen  -Tube feeds have been started, Will change NG tube to corpak today, can hopefully remove in coming days once nutrition adequate     Muscle rigidity, increased tone and spasms : Occurred post-procedurally, with reassuring evaluation (MRI spine, neuro c/s, CK normal) at that time. Increased tone seems to worsen during acute stress to her body (anesthesia, infection). Increased baclofen does not seem to be helping much. MRI brain normal . Per Neurology concern for functional component   - Appreciate chronic pain and PM&R: increased baclofen to 20  - Baclofen 20 TID     Elevated LFTs, intermittent: LFTs continue to fluctuate.  Unclear etiology and previously thought related to TPN versus chemotherapy although is now not on either.  -Continue to monitor daily  -Will consider expanded workup if persistent rise      Iron deficiency anemia: Ferritin 5, iron saturation 6%. Has had prior reactions with numerous attempts to give IV iron.      Headaches:  - Enteral PRN tylenol, fioricet daily prn could be considered if remains severe  - Avoid NSAIDs, avoid IV benadryl     Desmoid tumors - [name]: Chemotherapy on hold in setting of infections.      SIBO: Hold home cefdinir, doxy    VTE PPX: Lovenox    Disposition: Skilled nursing/AIR  Medically Clear: No    I personally spent greater than 55 minutes face-to-face and non-face-to-face in the care of this patient, which includes all pre, intra, and post visit time on the date of service.  All documented time was specific to the E/M visit and does not include any procedures that may have been performed.  __________________________________________________________________    Subjective:    No acute events, anxious regarding TEE this morning. Reporting ongoin blurry vision this morning that has not improved.     Objective    Temp:  [36.6 ??C (97.9 ??F)-36.8 ??C (98.2 ??F)] 36.8 ??C (98.2 ??F)  Heart Rate:  [66-93] 68  SpO2 Pulse:  [66-93] 66  Resp:  [13-19] 19  BP: (95-119)/(46-60) 102/48  SpO2:  [99 %-100 %] 100 %    Chronically ill-appearing young female sitting upright in bed appearing tired, NAD  Moist mucous membranes, nasal gastric tube in place  Heart rate and rhythm normal, no murmurs  Lungs clear to auscultation bilaterally  Abdomen soft, nontender  No lower extremity edema    Recent Results (from the past 24 hour(s))   CBC    Collection Time: 10/28/22  6:12 AM   Result Value Ref Range  WBC 8.4 3.6 - 11.2 10*9/L    RBC 3.46 (L) 3.95 - 5.13 10*12/L    HGB 9.1 (L) 11.3 - 14.9 g/dL    HCT 16.1 (L) 09.6 - 44.0 %    MCV 80.1 77.6 - 95.7 fL    MCH 26.3 25.9 - 32.4 pg    MCHC 32.9 32.0 - 36.0 g/dL    RDW 04.5 (H) 40.9 - 15.2 %    MPV 9.9 6.8 - 10.7 fL    Platelet 203 150 - 450 10*9/L   Hepatic Function Panel    Collection Time: 10/28/22  6:12 AM   Result Value Ref Range    Albumin 3.0 (L) 3.4 - 5.0 g/dL    Total Protein 6.1 5.7 - 8.2 g/dL    Total Bilirubin 0.2 (L) 0.3 - 1.2 mg/dL    Bilirubin, Direct <8.11 0.00 - 0.30 mg/dL    AST 23 <=91 U/L    ALT 136 (H) 10 - 49 U/L    Alkaline Phosphatase 245 (H) 46 - 116 U/L     Labs/Studies:  Labs and Studies from the last 24hrs per EMR and Reviewed      Principal Problem:    Other acute postprocedural pain  Active Problems:    [name]    Intestinal polyps    [name]    Neoplasm related pain    Physical deconditioning    History of colectomy    Intractable abdominal pain    Generalized anxiety disorder with panic attacks    SBO (small bowel obstruction) (CMS-HCC)    Small intestinal bacterial overgrowth (SIBO)

## 2022-10-28 NOTE — Unmapped (Unsigned)
[facility]  CONSULT NOTE - INFLAMMATORY BOWEL DISEASE  10/28/2022    Demographics:  [name] [name] is a 22 y.o. year old female    Referring physician:   Pcp, None Per Patient  [address] [place],  [place] [address]    Patient Care Team:  Jarold Motto, [facility] as PCP - General  Debbe Mounts, MD as Consulting Physician (Peds: Pediatric Hematology-Oncology)  Cherlynn Perches, MD as Consulting Physician (Orthopedic Surgery)  Jobe Gibbon, MD as Consulting Physician (Interventional Radiology)  Twana First, MD as Consulting Physician  Tatton-Howard, Clelia Croft, RN as Registered Nurse (Oncology Navigator)  Lenise Arena Judye Bos, MD as Attending Provider (Surgical Oncology)  Clarene Duke, MD as Consulting Physician (Plastic Surgery)  Helyn Numbers, MD as Consulting Physician (Gastroenterology)  Childers, Biagio Quint, MD as Consulting Physician (Palliative Medicine)  Belva Bertin, RN as Registered Nurse (Palliative Medicine)  Lajuana Matte, RN as Registered Nurse (Oncology Navigator)  Sheral Apley, MD (Medical Oncology)  Lajuana Matte, RN as Registered Nurse (Oncology Navigator)  [name], MD as Fellow (Psychiatry)           HPI / NOTE :     Today, I saw [name] [name] for follow-up consultation in the [facility] at the request of Dr. Elenore Rota regarding pouch problems and rectal bleeding.  Outside records including available clinical notes, endoscopy reports, imaging results and pathology results were reviewed in detail as part of this follow-up consultation.       Chief complaint: Follow-up    HPI:  Currently on TPN 2 days per week , weight stable, increased appetite, she reports well being ow, tries to reduce pain medication, wishes to stop TPN. Weight 140 pounds.   t     Pain therapy: on Lyrica. Buprenorphine  patch 20 mcg/hour (increased during hospitalization),  baclofen as needed, oxycodone as needed managed in pain clinic.    Anxiety/nausea : olanzapine (Zyprexa) 7.5 mg , mirtazapine 15 mg, atarax 1-2 times day. Wishes to reduce mirtazapine to 7.5 mg since she is too sleepy with 15 mg.     Desmoid therapy: Off sorafenib, which may have caused bleeding in past    Nutrition: Weight stable (highest since several months)  , swelling improved, rarely uses Lasix    Pouch: Bowel frequency 7-8/day. On cefdinir 1 tablet daily. Increased bloating.   Reports more straining with bowel movements. Wonders if we can do another dilation.    Review of Systems: positive for [name]  Otherwise, the balance of 10 systems is negative.    Bowel frequency /24 hours 4-8  Bowel frequency during night 0-1  Blood in bowel movements none  Abdominal pain  Moderate = 2  Bloating frequent  Incontinence none  Bodyweight increasing  Loperamide No  Lomotil No  Hyoscyamine:  No  Antibiotic cefdinir 1 tablet day            Past Medical History:   Past medical history:   Past Medical History:   Diagnosis Date    Abdominal pain     Acid reflux     occas    Anesthesia complication     itching, shaking, coldness; last few surgeries have gone much better    Cancer (CMS-HCC)     Cataract of right eye     COVID-19 virus infection 01/2019    Cyst of thyroid determined by ultrasound     monitoring  [name]     2 right forearm, 1 left thigh, 1 right scapula, 1 under left clavicle; multiple    Difficult intravenous access     [name] (familial adenomatous polyposis)     [name]     Gastric polyps     History of chemotherapy     last treatment approx 05/2019    History of colon polyps     History of COVID-19 01/2019    Iron deficiency anemia due to chronic blood loss     received iron infusion 11-2019    PONV (postoperative nausea and vomiting)     Rectal bleeding     Syncopal episodes     especially if becoming dehydrated     Past surgical history:   Past Surgical History:   Procedure Laterality Date    COLON SURGERY      cyroablation      cystis removal      desmoid removal      PR CLOSE ENTEROSTOMY,RESEC+ANAST N/A 10/09/2020    Procedure: ILEOSTOMY TAKEDOWN;  Surgeon: Mickle Asper, MD;  Location: OR [place];  Service: General Surgery    PR COLONOSCOPY W/BIOPSY SINGLE/MULTIPLE N/A 10/27/2012    Procedure: COLONOSCOPY, FLEXIBLE, PROXIMAL TO SPLENIC FLEXURE; WITH BIOPSY, SINGLE OR MULTIPLE;  Surgeon: Shirlyn Goltz Mir, MD;  Location: PEDS PROCEDURE ROOM [place];  Service: Gastroenterology    PR COLONOSCOPY W/BIOPSY SINGLE/MULTIPLE N/A 09/14/2013    Procedure: COLONOSCOPY, FLEXIBLE, PROXIMAL TO SPLENIC FLEXURE; WITH BIOPSY, SINGLE OR MULTIPLE;  Surgeon: Shirlyn Goltz Mir, MD;  Location: PEDS PROCEDURE ROOM [place];  Service: Gastroenterology    PR COLONOSCOPY W/BIOPSY SINGLE/MULTIPLE N/A 11/08/2014    Procedure: COLONOSCOPY, FLEXIBLE, PROXIMAL TO SPLENIC FLEXURE; WITH BIOPSY, SINGLE OR MULTIPLE;  Surgeon: Arnold Long Mir, MD;  Location: PEDS PROCEDURE ROOM [facility];  Service: Gastroenterology    PR COLONOSCOPY W/BIOPSY SINGLE/MULTIPLE N/A 12/26/2015    Procedure: COLONOSCOPY, FLEXIBLE, PROXIMAL TO SPLENIC FLEXURE; WITH BIOPSY, SINGLE OR MULTIPLE;  Surgeon: Arnold Long Mir, MD;  Location: PEDS PROCEDURE ROOM [facility];  Service: Gastroenterology    PR COLONOSCOPY W/BIOPSY SINGLE/MULTIPLE N/A 09/02/2017    Procedure: COLONOSCOPY, FLEXIBLE, PROXIMAL TO SPLENIC FLEXURE; WITH BIOPSY, SINGLE OR MULTIPLE;  Surgeon: Arnold Long Mir, MD;  Location: PEDS PROCEDURE ROOM [place];  Service: Gastroenterology    PR COLSC FLX W/REMOVAL LESION BY HOT BX FORCEPS N/A 08/27/2016    Procedure: COLONOSCOPY, FLEXIBLE, PROXIMAL TO SPLENIC FLEXURE; W/REMOVAL TUMOR/POLYP/OTHER LESION, HOT BX FORCEP/CAUTE;  Surgeon: Arnold Long Mir, MD;  Location: PEDS PROCEDURE ROOM [facility];  Service: Gastroenterology    PR COLSC FLX W/RMVL OF TUMOR POLYP LESION SNARE TQ N/A 02/25/2019    Procedure: COLONOSCOPY FLEX; W/REMOV TUMOR/LES BY SNARE;  Surgeon: Helyn Numbers, MD;  [facility]: [facility]; Service: Gastroenterology    PR COLSC FLX W/RMVL OF TUMOR POLYP LESION SNARE TQ N/A 03/13/2020    Procedure: COLONOSCOPY FLEX; W/REMOV TUMOR/LES BY SNARE;  Surgeon: Helyn Numbers, MD;  [facility]: [facility];  Service: Gastroenterology    PR EXC SKIN BENIG 2.1-3 CM TRUNK,ARM,LEG Right 02/25/2017    Procedure: EXCISION, BENIGN LESION INCLUDE MARGINS, EXCEPT SKIN TAG, LEGS; EXCISED DIAMETER 2.1 TO 3.0 CM;  Surgeon: Clarene Duke, MD;  Location: CHILDRENS OR [facility];  Service: Plastics    PR EXC SKIN BENIG 3.1-4 CM TRUNK,ARM,LEG Right 02/25/2017    Procedure: EXCISION, BENIGN LESION INCLUDE MARGINS, EXCEPT SKIN TAG, ARMS; EXCISED DIAMETER 3.1 TO 4.0 CM;  Surgeon: Clarene Duke, MD;  Location: CHILDRENS OR  [facility];  Service: Plastics    PR EXC SKIN BENIG >4 CM FACE,FACIAL Right 02/25/2017    Procedure: EXCISION, OTHER BENIGN LES INCLUD MARGIN, FACE/EARS/EYELIDS/NOSE/LIPS/MUCOUS MEMBRANE; EXCISED DIAM >4.0 CM;  Surgeon: Clarene Duke, MD;  Location: CHILDRENS OR [facility];  Service: Plastics    PR EXC TUMOR SOFT TISSUE LEG/ANKLE SUBQ 3+CM Right 08/05/2019    Procedure: EXCISION, TUMOR, SOFT TISSUE OF LEG OR ANKLE AREA, SUBCUTANEOUS; 3 CM OR GREATER;  Surgeon: Arsenio Katz, MD;  Location: MAIN OR [place];  Service: Plastics    PR EXC TUMOR SOFT TISSUE LEG/ANKLE SUBQ <3CM Right 08/05/2019    Procedure: EXCISION, TUMOR, SOFT TISSUE OF LEG OR ANKLE AREA, SUBCUTANEOUS; LESS THAN 3 CM;  Surgeon: Arsenio Katz, MD;  Location: MAIN OR [facility];  Service: Plastics    PR LAP, SURG PROCTECTOMY W J-POUCH N/A 08/10/2020    Procedure: ROBOTIC ASSISTED LAPAROSCOPIC PROCTOCOLECTOMY, ILEAL J POUCH, WITH OSTOMY;  Surgeon: Mickle Asper, MD;  Location: OR [place];  Service: General Surgery    PR NDSC EVAL INTSTINAL POUCH DX W/COLLJ SPEC SPX N/A 01/23/2021    Procedure: ENDO EVAL SM INTEST POUCH; DX;  Surgeon: Modena Nunnery, MD;  [facility]: [facility];  Service: Gastroenterology    PR NDSC EVAL INTSTINAL POUCH DX W/COLLJ SPEC SPX N/A 08/27/2021    Procedure: ENDO EVAL SM INTEST POUCH; DX;  Surgeon: Hunt Oris, MD;  Location: [facility] PROC[facility]URES [facility];  Service: Gastroenterology    PR NDSC EVAL INTSTINAL POUCH DX W/COLLJ SPEC SPX N/A 12/09/2021    Procedure: ENDO EVAL SM INTEST POUCH; DX;  Surgeon: Vidal Schwalbe, MD;  Location: GI PROCEDURES [facility];  Service: Gastroenterology    PR NDSC EVAL INTSTINAL POUCH DX W/COLLJ [facility] SPX Left 04/09/2022    Procedure: ENDO EVAL SM INTEST POUCH; DX;  Surgeon: Modena Nunnery, MD;  [facility]: [facility];  Service: Gastroenterology    PR NDSC EVAL INTSTINAL POUCH DX W/COLLJ SPEC SPX N/A 08/05/2022    Procedure: ENDO EVAL SM INTEST POUCH; DX;  Surgeon: Modena Nunnery, MD;  [facility]URES [facility];  Service: Gastroenterology    PR NDSC EVAL INTSTINAL POUCH W/BX SINGLE/MULTIPLE N/A 01/20/2022    Procedure: ENDOSCOPIC EVAL OF SMALL INTESTINAL POUCH; DIAGNOSTIC, No biopsies;  Surgeon: Andrey Farmer, MD;  Location: GI PROCEDURES [facility];  Service: Gastroenterology    PR NDSC EVAL INTSTINAL POUCH W/BX SINGLE/MULTIPLE N/A 02/13/2022    Procedure: ENDOSCOPIC EVAL OF SMALL INTESTINAL POUCH; DIAGNOSTIC, WITH BIOPSY;  Surgeon: Bronson Curb, MD;  Location: GI PROCEDURES [facility];  Service: Gastroenterology    PR UNLISTED PROCEDURE SMALL INTESTINE  01/23/2021    Procedure: UNLISTED PROCEDURE, SMALL INTESTINE;  Surgeon: Modena Nunnery, MD;  Location: [facility];  Service: Gastroenterology    PR UNLISTED PROCEDURE SMALL INTESTINE  02/13/2022    Procedure: UNLISTED PROCEDURE, SMALL INTESTINE;  Surgeon: Bronson Curb, MD;  Location: GI PROCEDURES [facility];  Service: Gastroenterology    PR UPPER GI ENDOSCOPY,BIOPSY N/A 10/27/2012    Procedure: UGI ENDOSCOPY; WITH BIOPSY, SINGLE OR MULTIPLE;  Surgeon: Shirlyn Goltz Mir, MD; Location: PEDS PROCEDURE ROOM [facility];  Service: Gastroenterology    PR UPPER GI ENDOSCOPY,BIOPSY N/A 09/14/2013    Procedure: UGI ENDOSCOPY; WITH BIOPSY, SINGLE OR MULTIPLE;  Surgeon: Shirlyn Goltz Mir, MD;  Location: PEDS PROCEDURE ROOM [facility];  Service: Gastroenterology    PR UPPER GI ENDOSCOPY,BIOPSY N/A 11/08/2014    Procedure: UGI ENDOSCOPY; WITH  BIOPSY, SINGLE OR MULTIPLE;  Surgeon: Arnold Long Mir, MD;  Location: PEDS PROCEDURE ROOM [facility];  Service: Gastroenterology    PR UPPER GI ENDOSCOPY,BIOPSY N/A 12/26/2015    Procedure: UGI ENDOSCOPY; WITH BIOPSY, SINGLE OR MULTIPLE;  Surgeon: Arnold Long Mir, MD;  Location: PEDS PROCEDURE ROOM [facility];  Service: Gastroenterology    PR UPPER GI ENDOSCOPY,BIOPSY N/A 08/27/2016    Procedure: UGI ENDOSCOPY; WITH BIOPSY, SINGLE OR MULTIPLE;  Surgeon: Arnold Long Mir, MD;  Location: PEDS PROCEDURE ROOM [facility];  Service: Gastroenterology    PR UPPER GI ENDOSCOPY,BIOPSY N/A 09/02/2017    Procedure: UGI ENDOSCOPY; WITH BIOPSY, SINGLE OR MULTIPLE;  Surgeon: Arnold Long Mir, MD;  Location: PEDS PROCEDURE ROOM [facility];  Service: Gastroenterology    PR UPPER GI ENDOSCOPY,BIOPSY N/A 03/13/2020    Procedure: UGI ENDOSCOPY; WITH BIOPSY, SINGLE OR MULTIPLE;  Surgeon: Helyn Numbers, MD;  Location: [facility];  Service: Gastroenterology    PR UPPER GI ENDOSCOPY,BIOPSY N/A 09/05/2021    Procedure: UGI ENDOSCOPY; WITH BIOPSY, SINGLE OR MULTIPLE;  Surgeon: Wendall Papa, MD;  [facility]: GI PROC[facility]URES [facility];  Service: Gastroenterology    PR UPPER GI ENDOSCOPY,DIAGNOSIS N/A 01/20/2022    Procedure: UGI ENDO, INCLUDE ESOPHAGUS, STOMACH, & DUODENUM &/OR JEJUNUM; DX W/WO COLLECTION SPECIMN, BY BRUSH OR WASH;  Surgeon: Andrey Farmer, MD;  Location: GI PROCEDURES [facility];  Service: Gastroenterology    TUMOR REMOVAL      multiple-head, neck, back, hand, right flank, multiple     Family history:   Family History   Problem Relation Age of Onset    No Known Problems Mother     No Known Problems Father     No Known Problems Sister     No Known Problems Brother     Stroke Maternal Grandmother     Other Maternal Grandmother         benign lesions of liver and pancreas, further details unknown    Cancer Maternal Grandmother     Diabetes Maternal Grandmother     Hypertension Maternal Grandmother     Thyroid disease Maternal Grandmother     Arthritis Maternal Grandfather     Asthma Maternal Grandfather     COPD Paternal Grandmother         Deceased    Miscarriages / Stillbirths Paternal Grandmother     Alcohol abuse Paternal Grandfather         Deceased    No Known Problems Maternal Aunt     No Known Problems Maternal Uncle     No Known Problems Paternal Aunt     No Known Problems Paternal Uncle     Anesthesia problems Neg Hx     Broken bones Neg Hx     Cancer Neg Hx     Clotting disorder Neg Hx     Collagen disease Neg Hx     Diabetes Neg Hx     Dislocations Neg Hx     Fibromyalgia Neg Hx     Gout Neg Hx     Hemophilia Neg Hx     Osteoporosis Neg Hx     Rheumatologic disease Neg Hx     Scoliosis Neg Hx     Severe sprains Neg Hx     Sickle cell anemia Neg Hx     Spinal Compression Fracture Neg Hx     Melanoma Neg Hx     Basal cell carcinoma Neg Hx     Squamous cell carcinoma Neg Hx  Social history:   Social History     Socioeconomic History    Marital status: Single   Tobacco Use    Smoking status: Never     Passive exposure: Past    Smokeless tobacco: Never   Vaping Use    Vaping status: Never Used   Substance and Sexual Activity    Alcohol use: Never    Drug use: Never    Sexual activity: Never   Other Topics Concern    Do you use sunscreen? Yes    Tanning bed use? No    Are you easily burned? No    Excessive sun exposure? No    Blistering sunburns? 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     Social Determinants of Health     Financial Resource Strain: Low Risk  (09/26/2022) Overall Financial Resource Strain (CARDIA)     Difficulty of Paying Living Expenses: Not very hard   Food Insecurity: No Food Insecurity (09/26/2022)    Hunger Vital Sign     Worried About Running Out of Food in the Last Year: Never true     Ran Out of Food in the Last Year: Never true   Transportation Needs: No Transportation Needs (09/26/2022)    PRAPARE - Therapist, art (Medical): No     Lack of Transportation (Non-Medical): No             Allergies:     Allergies   Allergen Reactions    Adhesive Tape-Silicones Hives and Rash     Paper tape and Tegederm ok. Biopatch causes blistering but has tolerated CHG Tegaderm    Ferrlecit [Sodium Ferric Gluconat-Sucrose] Swelling and Rash    Levofloxacin Swelling and Rash     Swelling in mouth, rash,     Methylnaltrexone      Per Patient: I lost bowel control, severe abdominal cramping, and elevated BP    Neomycin Swelling     Rxn after ear drops; ear swelling    Papaya Hives    Morphine Nausea And Vomiting    Zosyn [Piperacillin-Tazobactam] Itching and Rash     Red and itchy    Compazine [Prochlorperazine] Other (See Comments)     Extreme agitation    Iron Analogues     Iron Dextran Itching     Received iron dextran 06/08/22 over 12 hours, had itching and redness/flushing during the infusion and for a couple days after. Required IV benadryl w/flares between doses and ultimately treated w/IV methylpred for 2 days.     Latex, Natural Rubber Rash             Medications:     No current facility-administered medications for this visit.     Current Outpatient Medications   Medication Sig Dispense Refill    doxycycline (VIBRA-TABS) 100 MG tablet TAKE 1 TABLETS BY MOUTH TWICE A DAY ON TUESDAY, THURSDAY, SATURDAY, SUNDAY. 28 tablet 0     Facility-Administered Medications Ordered in Other Visits   Medication Dose Route Frequency Provider Last Rate Last Admin    acetaminophen (TYLENOL) tablet 650 mg  650 mg Enteral tube: gastric Q6H PRN Suzzanne Cloud, MD   650 mg at 10/22/22 2123    aluminum-magnesium hydroxide-simethicone (MAALOX PLUS) 200-200-20 mg/5 mL suspension 60 mL  60 mL Oral Q6H PRN Suzzanne Cloud, MD  baclofen (LIORESAL) tablet 20 mg  20 mg Oral TID Suzzanne Cloud, MD   20 mg at 10/28/22 1349    bisacodyl (DULCOLAX) suppository 10 mg  10 mg Rectal BID Suzzanne Cloud, MD   10 mg at 10/26/22 2137    buprenorphine-naloxone (SUBOXONE) 2-0.5 mg SL film 0.5 mg of buprenorphine  0.25 Film Sublingual TID Lambert Mody, MD PhD   0.5 mg of buprenorphine at 10/28/22 1349    calcium carbonate (TUMS) chewable tablet 400 mg elem calcium  400 mg elem calcium Oral Daily PRN Terri Piedra, AGNP        ceFAZolin (ANCEF) IVPB 2 g in 50 ml dextrose (premix)  2 g Intravenous Q8H [name], MD        cetirizine (ZYRTEC) tablet 10 mg  10 mg Enteral tube: gastric Daily Suzzanne Cloud, MD   10 mg at 10/28/22 3220    CHEMO CLARIFICATION ORDER   Other Continuous PRN Lestine Box, PharmD        diclofenac sodium (VOLTAREN) 1 % gel 2 g  2 g Topical QID [name], MD   2 g at 10/27/22 2135    enoxaparin (LOVENOX) syringe 40 mg  40 mg Subcutaneous Q24H SCH Suzzanne Cloud, MD   40 mg at 10/25/22 2200    famotidine (PEPCID) tablet 20 mg  20 mg Oral Nightly Suzzanne Cloud, MD   20 mg at 10/27/22 2132    guaiFENesin (ROBITUSSIN) oral syrup  200 mg Oral Q4H PRN Kateri Plummer, MD   200 mg at 10/17/22 2344    hydrocortisone 2.5 % cream   Topical BID PRN Cordelia Poche, MD   Given at 10/27/22 2300    HYDROmorphone (PF) (DILAUDID) injection 1 mg  1 mg Intravenous Q3H PRN Suzzanne Cloud, MD   1 mg at 10/28/22 1220    IP OKAY TO TREAT   Other Continuous PRN Sheral Apley, MD        lidocaine (LMX) 4 % cream   Topical TID PRN Suzzanne Cloud, MD        LORazepam (ATIVAN) injection 1 mg  1 mg Intravenous BID PRN Suzzanne Cloud, MD   1 mg at 10/28/22 0600    LORazepam (ATIVAN) tablet 0.5 mg  0.5 mg Oral TID PRN Suzzanne Cloud, MD        micafungin [facility]) 100 mg in sodium chloride 0.9 % (NS) 100 mL IVPB-MBP  100 mg Intravenous Q24H Suzzanne Cloud, MD   Stopped at 10/27/22 2233    mirtazapine (REMERON) tablet 15 mg  15 mg Enteral tube: gastric Nightly Kateri Plummer, MD   15 mg at 10/27/22 2354    mucositis mixture (with lidocaine)  10 mL Mouth QID PRN Suzzanne Cloud, MD   10 mL at 10/26/22 2146    naloxegol (MOVANTIK) tablet 25 mg  25 mg Oral Daily Suzzanne Cloud, MD   25 mg at 10/28/22 2542    [Provider Hold] nirogacestat (OGSIVEO) tablet 50 mg *Patient Supplied*  50 mg Oral BID Sheral Apley, MD   50 mg at 10/20/22 2035    ondansetron [facility]) injection 4 mg  4 mg Intravenous Q12H PRN Suzzanne Cloud, MD   4 mg at 10/27/22 2005    pantoprazole (Protonix) EC tablet 40 mg  40 mg Oral BID Suzzanne Cloud, MD   40 mg at 10/28/22 0823    phenol (CHLORASEPTIC) 1.4 %  spray 2 spray  2 spray Mucous Membrane Q2H PRN Rocco Serene, MD   2 spray at 10/03/22 0138    polyethylene glycol (MIRALAX) packet 17 g  17 g Oral Daily PRN Suzzanne Cloud, MD        polyethylene glycol [facility]) packet 17 g  17 g Oral BID Suzzanne Cloud, MD   17 g at 10/26/22 6962    promethazine (PHENERGAN) 6.5 mg in sodium chloride (NS) 0.9 % 50 mL IVPB  6.5 mg Intravenous Q6H PRN Suzzanne Cloud, MD   Stopped at 10/28/22 0013    pseudoephedrine (SUDAFED) tablet 30 mg  30 mg Oral Daily PRN Suzzanne Cloud, MD        senna Mancel Parsons) tablet 2 tablet  2 tablet Oral Nightly Suzzanne Cloud, MD   2 tablet at 10/27/22 2131    sodium chloride (NS) 0.9 % infusion  10 mL/hr Intravenous Continuous Nat Christen., MD        sodium chloride (NS) 0.9 % infusion  100 mL/hr Intravenous Continuous [name], MD 100 mL/hr at 10/28/22 1141 100 mL/hr at 10/28/22 1141    zinc oxide-cod liver oil (DESITIN 40%) Paste   Topical Daily PRN Arman Filter, MD   Given at 10/13/22 1015             Physical Exam:   There were no vitals taken for this visit.      BP Readings from Last 3 Encounters:   10/28/22 136/76   09/24/22 122/90   09/16/22 114/72      Wt Readings from Last 12 Encounters:   10/26/22 54 kg (119 lb)   09/24/22 62.4 kg (137 lb 9.6 oz)   09/16/22 62 kg (136 lb 11.2 oz)   09/02/22 63 kg (139 lb)   08/05/22 62.1 kg (137 lb)   07/18/22 62.8 kg (138 lb 6.4 oz)   07/16/22 62.1 kg (137 lb)   07/14/22 62.9 kg (138 lb 9.6 oz)   06/15/22 63.6 kg (140 lb 3.4 oz)   06/06/22 60.3 kg (132 lb 14.4 oz)   05/02/22 61.8 kg (136 lb 4.8 oz)   04/27/22 62.9 kg (138 lb 10.7 oz)      BMI: Estimated body mass index is 19.8 kg/m?? as calculated from the following:    Height as of 09/29/22: 165.1 cm (5' 5).    Weight as of 10/26/22: 54 kg (119 lb).    BSA: Estimated body surface area is 1.57 meters squared as calculated from the following:    Height as of 09/29/22: 165.1 cm (5' 5).    Weight as of 10/26/22: 54 kg (119 lb).    Televisit          Labs, Data & Indices:     Lab Review:   No results displayed because visit has over 200 results.        .............................................................................................................................................             HISTORY:     Brief Disease Course:    08/2020 colectomy for [name] 8/022 ileostomy takedown, complicated by postop ileus and inability to eat during stage 1 and 2 (need for TPN)   2023-2024 significant abdominal pain without a clear objective correlate, recurrent hospitalizations, need for TPN due to inability to keep nutrition up due to abdominal pain.   06/2022 hospitalization for abdominal pain, later on improvement and stop of TPN    Other Paraspinal and anterior chest desmoid tumors  Colectomy specimen:  Terminal ileum, appendix and colon, total colectomy:  -Tubular adenomas (at least 100), extending from the ileocecal valve to the rectum, with the majority located in the rectum. The adenomas range from 0.2 cm to 0.7 cm in greatest dimension.  -There is no evidence of high-grade dysplasia or malignancy.  -Mucosal margins are negative for dysplasia (5.2 cm from proximal, 0.1 cm from distal).  -The appendix and ileum are not involved.      Endoscopy:   Pouchoscopy 03/2022  The examined portion of the ileum was normal.                         - Friability with contact bleeding in the ileoanal                          pouch only along suture lines, otherwise healthy                          pouch. Treated with argon beam coagulation.                         - Rectal cuff with healthy appearing mucosa seen.                         - No specimens collected.        Pouchoscopy 02/2022     Patient is status-post total colectomy with an ileal pouch-anal        anastomosis.       Anal fissures were found on anoscopy       The ileocolonic anastomosis contained a single (solitary)        circumferential ulcer, approximately 10mm in length. Oozing was present.        Biopsies were taken with a cold forceps for histology. Coagulation for        bleeding prevention using argon plasma at 0.5 liters/minute and 25 watts        was successful.       The exam was otherwise without abnormality.       A: Colon, pouch anastomosis, biopsy:  -Severely active pouchitis with ulceration, mild glandular architecture distortion and focal adenomatous change.    Pouchoscopy 01/2022:  Anal fissure found on perianal exam.                         - Stool in the ileoanal pouch and neo-terminal ileum.                         - The ileoanal pouch and neo-terminal ileum are normal.                         - A single (solitary) ulcer at the ileocolonic                          anastomosis.    Upper endoscopy 08/2021     Normal esophagus.                         - Multiple gastric polyps. Biopsied.                         -  Normal stomach otherwise. Biopsied.                         - Normal examined duodenum. Biopsied.    A: Duodenum, biopsy  - Adenomatous polyp (1 fragment)  - No high grade dysplasia or carcinoma identified  - Separate fragments of duodenal mucosa with mild-moderate villous blunting  - No increased intraepithelial lymphocytes identified     B: Stomach, biopsy  - Gastric fundic mucosa with chronic superficial gastritis  - Gastric antral mucosa with erosive minimally active chronic superficial gastritis and slight reactive foveolar hyperplasia  - No Helicobacter pylori identified on H&E stain  - No intestinal metaplasia or dysplasia identified     C: Stomach, polypectomy  - Polypoid fragments of gastric fundic mucosa with features suggestive of early fundic gland polyps  - No intestinal metaplasia or dysplasia identified      Pouchoscopy 08/2021   The perianal and digital rectal examinations were normal.       The j-pouch, pouch inlet and rectal cuff appeared normal.       The exam was otherwise without abnormality, including no evidence of        mass of intussusception.                                                               Pouchoscopy 01/23/2021  Impression:            - Preparation of the colon was fair.                         - The examined portion of the ileum was normal.                         - The ileoanal pouch is normal.                         - Rectal cuff with congestion and an intact staple                          line seen.                         - No specimens collected.    - Hegar dilation 17 - 20 mm    Last upper and lower endoscopy 03/2020  Upper    - Normal esophagus.                         - Multiple gastric polyps. Fundic gland polyps                         - Normal mucosa was found in the antrum. Biopsied.                         - Normal examined duodenum    Colon  - Six 6 to 10 mm polyps in the rectum, in the sigmoid  colon, in the descending colon and in the transverse                          colon, removed with a cold snare. Resected and                          retrieved.                         - 300 polyps in the entire colon.    A: Stomach, biopsy  - Gastric fundic mucosa with chronic superficial gastritis  - Gastric antral mucosa with chronic superficial gastritis and reactive foveolar hyperplasia  - No Helicobacter pylori identified on H&E stain  - No intestinal metaplasia or dysplasia identified     B: Colon, transverse, biopsy  - Adenomatous polyp (multiple fragments)  - No high grade dysplasia or carcinoma identified    Imaging:     US Liver doppler 04/23/2021   IMPRESSION:   1.Patent hepatic vasculature with normal flow direction.   2.Mildly echogenic liver, which is nonspecific, but may be seen in fibrofatty changes.       Soluble contrast enema 09/19/2020     Mild narrowing at the ileal pouch-anal anastomosis which likely represents mild post-operative edema. Small linear focus of contrast at the level of the anastomosis is favored to be intraluminal over a tiny contained leak given that it spontaneously resolves.     CT abdomen and pelvis August 26, 2020     1. Unremarkable appearance to the small bowel. J pouch is unremarkable. Diverting loop ileostomy is unremarkable.      CT abdomen and pelvis August 18, 2020     IMPRESSION:  Dilated loops of ileum distal to the loop ileostomy with associated wall thickening.  Findings are suggestive of nonspecific ileitis, infectious or inflammatory.  No drainable fluid collections.  No evidence of obstruction    CT abdomen and pelvis w contrast 02/15/2021    IMPRESSION:   Prior colectomy. Mid to distal small bowel loops are prominent with   air-fluid levels. Difficult to completely exclude distal partial   small bowel obstruction.     Small amount of free fluid in the abdomen and pelvis.       CT abdomen and pelvis with contrast 02/22/2021  IMPRESSION:   There is no evidence of intestinal obstruction or pneumoperitoneum. There is no hydronephrosis.     Previous subtotal colectomy. Dilation of distal ileum seen in the   previous examination could not be identified in the current study.   Oral contrast has reached the rectum.     Possible small hepatic cyst. There is 10 mm smooth marginated   low-density lesion in the left kidney with density measurements   higher than usual for simple cyst. Follow-up renal sonogram in the   outpatient setting should be considered to re-evaluate this finding.               Assessment & Recommendations:     [name] [name] is a 22 y.o. female who presents for follow up for GI issues after pouch done for [name].     Pouchitis symptoms, abdominal distension: Overall stable, increase of cefdinir now to twice daily due to increased abdominal pain and feeling of pressure.  Currently no bleeding and no need for Canasa. Straining: Plan for pouchoscopy.     I explained that the increased  bloating is also due to the narcotics which favor bacterial overgrowth and slowing bowel motility.     Abdominal pain: Multifactorial, probably different by anxiety, narcotic bowel syndrome and other factors.  On  buprenorphine . Discussed again concept of narcotic bowel syndrome and need to reduce and stop pain medication. Patient wants to stop medications as well now since she wants to drive to school herself again.      Anxiety/nausea : Nausea improved.  On olanzapine (Zyprexa) 7.5 mg and mirtazapine increased to 15 mg , but patient too sleepy and wants to reduce again to 7.5 mg.  She is also on twice daily Atarax which helps her anxiety.  Follows with psychiatry.    Desmoid therapy:Off  sorafenib, which may have caused the pouch bleeding (but given the now ongoing bleeding I am not sure about it anymore).  Start a new therapy nirogacestat (Ogsiveo???) planned, which just was FDA approved.     Nutrition: Patient is able to eat 1-2 small meals per day.  Overall think she can eat better now and wants to stop TPN. Plan to stop now and if it works well without TPN in the next 4 weeks, plan to pull the line.       B12/Folate (normal in 02/06/2022) (600 B12, Folate 19)    Fecal incontinence: Today no complaint    Iron deficiency anemia: Got iron infusion during hospitalization and feels much better now.         Portions of this record have been created using Scientist, clinical (histocompatibility and immunogenetics). Dictation errors have been sought, but may not have been identified and corrected.     Decision-making today was high complexity on the basis of discussion of [name]/desmoid tumors/pouchitis, chronic abdominal pain, and reviewing imaging/colonoscopy with future next steps.    {    Coding tips - Do not edit this text, it will delete upon signing of note!    Telephone visits [phone] for Physicians and APPs and [phone] for Non- Physician Clinicians)- Only use minutes on the phone to determine level of service.    Video visits [phone]) - Use either level of medical decision making just as an in-person visit OR time which includes both minutes on video and pre/post minutes to determine the level of service.      :[id number]}  The patient reports they are physically located in [place] and is currently: at home. I conducted a audio/video visit. I spent  0s on the video call with the patient. I spent an additional 15 minutes on pre- and post-visit activities on the date of service .       PLAN:      There are no Patient Instructions on file for this visit.

## 2022-10-28 NOTE — Unmapped (Signed)
Echocardiography Laboratory  [place], [place]  Tel: [phone]       HISTORY & PHYSICAL ASSESSMENT      Referring Physicians:  Worthy Flank, [id number] [facility] [id number] [facility] [place],  [place] [address]          PCP:  Jarold Motto, [facility]  Phone:  [phone]  Fax:  [phone]    Primary Cardiologist: None    Procedure to be performed: Transesophageal echocardiogram with General Anesthesia    Indication(s): endocarditis    Consent:  I hereby certify that the nature, purpose, benefits, usual and most frequent risks of, and alternatives to, the operation or procedure have been explained to the patient (or person authorized to sign for the patient) either by a physician or by the provider who is to perform the operation or procedure. Potential complications include a 10-15% risk of lip injury and/or hoarseness; a 2% risk of dysphagia, and a <0.01% risk of perforation, major bleeding, or mortality. I also certify that the patient has had an opportunity to ask questions, and that those questions have been answered. I believe that the patient (or person authorized to sign for the patient) understands what has been explained, and has consented to the operation or procedure.      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 She was put back on TPN and remained admitted due to abdominal pain and debilitating muscle spasms/rigidity of uncertain etiology. Hospital course now further complicated by bacteremia/fungemia.      GNR bacteremia/Candida fungemia 8/13 : Source now presumably her central line that she was getting TPN through. She is no longer having fevers and is clinically slowly improving . Central line exchanged 8/14     ___________________________________________________________________________      NPO Status: NPO - with no solids or tube feeds for >6 hours and no liquids for >4 hours    Relative contraindications: None    Findings of prior TTE/TEE:     TTE 10/24/2022:  1. Technically difficult study.    2. The left ventricular systolic function is normal, LVEF is visually  estimated at 55-60%.    3. Possible mobile echodensity noted on the mitral valve; concern for  possible vegetation.    4. The right ventricle is normal in size, with normal systolic function.    5. There is a mobile echodensity in the right atrium. Cannot rule out  vegetation on tricuspid valve vs indwelling catheter.    Procedural aspects of prior TEEs: No prior TEE    History of cardiac surgery or implants: None    History of complications with anesthesia, sedation, or intubation:  None    History of sleep apnea: No    Current Pertinent Medications (Anticoagulant, Antiplatelet, Sedating Agents):  lovenox dvt    Pertinent Allergies: None pertinent to procedure    History of tobacco, alcohol, substance use: None    Isolation Precautions: None      ___________________________________________________________________________    Vitals:    10/28/22 1200   BP: 103/46   Pulse: 76   Resp: 12   Temp: 36.8 ??C (98.2 ??F)   SpO2: 99%     Body mass index is 19.8 kg/m??.    Physical Exam:   Cardiac: RRR, no murmurs.  Pulmonary: CTAB,  normal work of breathing  Airway: No obvious concerns for complicated airway  Patient is able to lie flat comfortably

## 2022-10-29 LAB — BASIC METABOLIC PANEL
ANION GAP: 10 mmol/L (ref 5–14)
BLOOD UREA NITROGEN: 8 mg/dL — ABNORMAL LOW (ref 9–23)
BUN / CREAT RATIO: 19
CALCIUM: 9.9 mg/dL (ref 8.7–10.4)
CHLORIDE: 104 mmol/L (ref 98–107)
CO2: 28 mmol/L (ref 20.0–31.0)
CREATININE: 0.43 mg/dL — ABNORMAL LOW
EGFR CKD-EPI (2021) FEMALE: >90 mL/min/{1.73_m2} (ref >=60)
GLUCOSE RANDOM: 127 mg/dL (ref 70–179)
POTASSIUM: 4.2 mmol/L (ref 3.4–4.8)
SODIUM: 142 mmol/L (ref 135–145)

## 2022-10-29 LAB — HEPATIC FUNCTION PANEL
ALBUMIN: 3.6 g/dL (ref 3.4–5.0)
ALKALINE PHOSPHATASE: 246 U/L — ABNORMAL HIGH (ref 46–116)
ALT (SGPT): 120 U/L — ABNORMAL HIGH (ref 10–49)
AST (SGOT): 22 U/L (ref <=34)
BILIRUBIN DIRECT: 0.1 mg/dL (ref 0.00–0.30)
BILIRUBIN TOTAL: 0.3 mg/dL (ref 0.3–1.2)
PROTEIN TOTAL: 7.4 g/dL (ref 5.7–8.2)

## 2022-10-29 LAB — CBC
HEMATOCRIT: 32.7 % — ABNORMAL LOW (ref 34.0–44.0)
HEMOGLOBIN: 10.6 g/dL — ABNORMAL LOW (ref 11.3–14.9)
MEAN CORPUSCULAR HEMOGLOBIN CONC: 32.5 g/dL (ref 32.0–36.0)
MEAN CORPUSCULAR HEMOGLOBIN: 26 pg (ref 25.9–32.4)
MEAN CORPUSCULAR VOLUME: 80.1 fL (ref 77.6–95.7)
MEAN PLATELET VOLUME: 10 fL (ref 6.8–10.7)
PLATELET COUNT: 261 10*9/L (ref 150–450)
RED BLOOD CELL COUNT: 4.08 10*12/L (ref 3.95–5.13)
RED CELL DISTRIBUTION WIDTH: 14.7 % (ref 12.2–15.2)
WBC ADJUSTED: 8.3 10*9/L (ref 3.6–11.2)

## 2022-10-29 LAB — MAGNESIUM: MAGNESIUM: 1.9 mg/dL (ref 1.6–2.6)

## 2022-10-29 MED ADMIN — HYDROmorphone (PF) injection Syrg 0.5 mg: .5 mg | INTRAVENOUS | Stop: 2022-10-28

## 2022-10-29 MED ADMIN — promethazine (PHENERGAN) 6.5 mg in sodium chloride (NS) 0.9 % 50 mL IVPB: 6.5 mg | INTRAVENOUS | @ 11:00:00

## 2022-10-29 MED ADMIN — HYDROmorphone (PF) (DILAUDID) injection 1 mg: 1 mg | INTRAVENOUS | @ 19:00:00 | Stop: 2022-11-04

## 2022-10-29 MED ADMIN — sodium chloride 0.9% (NS) bolus 500 mL: 500 mL | INTRAVENOUS | @ 15:00:00 | Stop: 2022-10-29

## 2022-10-29 MED ADMIN — ceFAZolin (ANCEF) IVPB 2 g in 50 ml dextrose (premix): 2 g | INTRAVENOUS | @ 18:00:00 | Stop: 2022-11-02

## 2022-10-29 MED ADMIN — buprenorphine-naloxone (SUBOXONE) 2-0.5 mg SL film 0.5 mg of buprenorphine: .25 | SUBLINGUAL | @ 18:00:00

## 2022-10-29 MED ADMIN — HYDROmorphone (PF) (DILAUDID) injection 1 mg: 1 mg | INTRAVENOUS | @ 15:00:00 | Stop: 2022-11-04

## 2022-10-29 MED ADMIN — HYDROmorphone (PF) (DILAUDID) injection 1 mg: 1 mg | INTRAVENOUS | @ 01:00:00 | Stop: 2022-11-04

## 2022-10-29 MED ADMIN — ondansetron (ZOFRAN) injection 4 mg: 4 mg | INTRAVENOUS | @ 01:00:00

## 2022-10-29 MED ADMIN — oxyCODONE (ROXICODONE) immediate release tablet 10 mg: 10 mg | ORAL | @ 21:00:00 | Stop: 2022-11-02

## 2022-10-29 MED ADMIN — LORazepam (ATIVAN) injection 1 mg: 1 mg | INTRAVENOUS | @ 12:00:00

## 2022-10-29 MED ADMIN — naloxegol (MOVANTIK) tablet 25 mg: 25 mg | ORAL | @ 13:00:00

## 2022-10-29 MED ADMIN — micafungin (MYCAMINE) 100 mg in sodium chloride 0.9 % (NS) 100 mL IVPB-MBP: 100 mg | INTRAVENOUS | Stop: 2022-11-05

## 2022-10-29 MED ADMIN — HYDROmorphone (PF) (DILAUDID) injection 1 mg: 1 mg | INTRAVENOUS | Stop: 2022-11-04

## 2022-10-29 MED ADMIN — polyethylene glycol (MIRALAX) packet 17 g: 17 g | ORAL | @ 13:00:00

## 2022-10-29 MED ADMIN — buprenorphine-naloxone (SUBOXONE) 2-0.5 mg SL film 0.5 mg of buprenorphine: .25 | SUBLINGUAL | @ 13:00:00

## 2022-10-29 MED ADMIN — oxyCODONE (ROXICODONE) immediate release tablet 10 mg: 10 mg | ORAL | @ 03:00:00 | Stop: 2022-11-02

## 2022-10-29 MED ADMIN — carboxymethylcellulose sodium (REFRESH CELLUVISC) 1 % ophthalmic gel 1 drop: 1 [drp] | OPHTHALMIC | @ 22:00:00

## 2022-10-29 MED ADMIN — senna (SENOKOT) tablet 2 tablet: 2 | ORAL | @ 03:00:00

## 2022-10-29 MED ADMIN — HYDROmorphone (PF) (DILAUDID) injection 1 mg: 1 mg | INTRAVENOUS | @ 05:00:00 | Stop: 2022-11-04

## 2022-10-29 MED ADMIN — cetirizine (ZYRTEC) tablet 10 mg: 10 mg | GASTROENTERAL | @ 13:00:00

## 2022-10-29 MED ADMIN — mirtazapine (REMERON) tablet 15 mg: 15 mg | GASTROENTERAL | @ 03:00:00

## 2022-10-29 MED ADMIN — pantoprazole (Protonix) oral suspension: 40 mg | ORAL | @ 13:00:00

## 2022-10-29 MED ADMIN — baclofen (LIORESAL) tablet 20 mg: 20 mg | ORAL | @ 03:00:00

## 2022-10-29 MED ADMIN — HYDROmorphone (PF) (DILAUDID) injection 1 mg: 1 mg | INTRAVENOUS | @ 08:00:00 | Stop: 2022-11-04

## 2022-10-29 MED ADMIN — butalbital-acetaminophen-caffeine (ESGIC) per tablet 1 tablet: 1 | ORAL | @ 03:00:00 | Stop: 2022-10-28

## 2022-10-29 MED ADMIN — buprenorphine-naloxone (SUBOXONE) 2-0.5 mg SL film 0.5 mg of buprenorphine: .25 | SUBLINGUAL | @ 03:00:00

## 2022-10-29 MED ADMIN — pantoprazole (Protonix) oral suspension: 40 mg | ORAL | @ 03:00:00

## 2022-10-29 MED ADMIN — promethazine (PHENERGAN) 6.5 mg in sodium chloride (NS) 0.9 % 50 mL IVPB: 6.5 mg | INTRAVENOUS | @ 05:00:00

## 2022-10-29 MED ADMIN — LORazepam (ATIVAN) tablet 0.5 mg: .5 mg | ORAL | @ 21:00:00

## 2022-10-29 MED ADMIN — baclofen (LIORESAL) tablet 20 mg: 20 mg | ORAL | @ 13:00:00 | Stop: 2022-10-29

## 2022-10-29 MED ADMIN — diclofenac sodium (VOLTAREN) 1 % gel 2 g: 2 g | TOPICAL | @ 23:00:00

## 2022-10-29 MED ADMIN — LORazepam (ATIVAN) injection 0.5 mg: .5 mg | INTRAVENOUS | @ 08:00:00 | Stop: 2022-10-29

## 2022-10-29 MED ADMIN — famotidine (PEPCID) tablet 20 mg: 20 mg | ORAL | @ 03:00:00

## 2022-10-29 MED ADMIN — HYDROmorphone (PF) (DILAUDID) injection 1 mg: 1 mg | INTRAVENOUS | @ 11:00:00 | Stop: 2022-11-04

## 2022-10-29 MED ADMIN — ceFAZolin (ANCEF) IVPB 2 g in 50 ml dextrose (premix): 2 g | INTRAVENOUS | @ 03:00:00 | Stop: 2022-11-02

## 2022-10-29 MED ADMIN — promethazine (PHENERGAN) 6.5 mg in sodium chloride (NS) 0.9 % 50 mL IVPB: 6.5 mg | INTRAVENOUS | @ 18:00:00

## 2022-10-29 MED ADMIN — lactated ringers bolus 500 mL: 500 mL | INTRAVENOUS | @ 08:00:00 | Stop: 2022-10-29

## 2022-10-29 MED ADMIN — gabapentin (NEURONTIN) capsule 100 mg: 100 mg | ORAL | @ 18:00:00

## 2022-10-29 MED ADMIN — ceFAZolin (ANCEF) IVPB 2 g in 50 ml dextrose (premix): 2 g | INTRAVENOUS | @ 09:00:00 | Stop: 2022-11-02

## 2022-10-29 NOTE — Unmapped (Signed)
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 She was put back on TPN and remained admitted due to abdominal pain and debilitating muscle spasms/rigidity of uncertain etiology. Hospital course now further complicated by bacteremia/fungemia.     GNR bacteremia/Candida fungemia 8/13 : Source now presumably her central line that she was getting TPN through. She is no longer having fevers and is clinically slowly improving . Central line exchanged 8/14. Negative cultures since 8/13 . TEE without vegetations   - Consulted ID appreciate recs  - Follow-up repeat cultures  - Continue Cefazolin and micafungin with course to be determined by ID     Chronic abdominal pain, nausea 2/2 desmoid tumors and prior surgeries: Continues to have significant nausea with inadequate intake  - Continue scheduled bowel regimen  -Tube feeds have been started, Will change NG tube to corpak today, can hopefully remove in coming days once nutrition adequate     Muscle rigidity, increased tone and spasms : Occurred post-procedurally, with reassuring evaluation (MRI spine, neuro c/s, CK normal) at that time. Increased tone seems to worsen during acute stress to her body (anesthesia, infection). MRI brain normal . She had increased titer of calcium channel antibody testing and Pincus Badder syndrome remains on differential    - Obtain EMG testing   - Hold baclofen with concern for lambert eaton      Elevated LFTs, intermittent: LFTs continue to fluctuate.  Unclear etiology and previously thought related to TPN versus chemotherapy although is now not on either.  -Continue to monitor daily  -Will consider expanded workup if persistent rise      Iron deficiency anemia: Ferritin 5, iron saturation 6%. Has had prior reactions with numerous attempts to give IV iron.      Headaches:  - Enteral PRN tylenol, fioricet daily prn could be considered if remains severe  - Avoid NSAIDs, avoid IV benadryl     Desmoid tumors - [name]: Chemotherapy on hold in setting of infections.      SIBO: Hold home cefdinir, doxy    VTE PPX: Lovenox    Disposition: Skilled nursing/AIR  Medically Clear: No    I personally spent greater than 55 minutes face-to-face and non-face-to-face in the care of this patient, which includes all pre, intra, and post visit time on the date of service.  All documented time was specific to the E/M visit and does not include any procedures that may have been performed.  __________________________________________________________________    Subjective:    No acute events overnight . Nausea somewhat improved this morning. Continues to have significant stiffness and trouble moving lower extremities.     Objective    Temp:  [36.5 ??C (97.7 ??F)-37.3 ??C (99.2 ??F)] 36.7 ??C (98.1 ??F)  Heart Rate:  [76-118] 88  SpO2 Pulse:  [75-111] 88  Resp:  [8-28] 13  BP: (95-140)/(52-91) 114/55  FiO2 (%):  [28 %] 28 %  SpO2:  [98 %-100 %] 99 %    Chronically ill-appearing young female sitting upright in bed appearing tired, NAD  Moist mucous membranes, nasal gastric tube in place  Heart rate and rhythm normal, no murmurs  Lungs clear to auscultation bilaterally  Abdomen soft, nontender  No lower extremity edema  Bilateral LE rigidity with reduced reflexes . Feet are plantarflexed.  Recent Results (from the past 24 hour(s))   POCT Glucose    Collection Time: 10/28/22 11:46 PM   Result Value Ref Range    Glucose, POC 73 70 - 179 mg/dL   POCT Glucose    Collection Time: 10/29/22  4:10 AM   Result Value Ref Range    Glucose, POC 142 70 - 179 mg/dL   Basic Metabolic Panel    Collection Time: 10/29/22  4:11 AM   Result Value Ref Range    Sodium 142 135 - 145 mmol/L    Potassium 4.2 3.4 - 4.8 mmol/L    Chloride 104 98 - 107 mmol/L    CO2 28.0 20.0 - 31.0 mmol/L    Anion Gap 10 5 - 14 mmol/L    BUN 8 (L) 9 - 23 mg/dL    Creatinine 1.61 (L) 0.55 - 1.02 mg/dL    BUN/Creatinine Ratio 19     eGFR CKD-EPI (2021) Female >90 >=60 mL/min/1.67m2    Glucose 127 70 - 179 mg/dL    Calcium 9.9 8.7 - 09.6 mg/dL   CBC    Collection Time: 10/29/22  4:11 AM   Result Value Ref Range    WBC 8.3 3.6 - 11.2 10*9/L    RBC 4.08 3.95 - 5.13 10*12/L    HGB 10.6 (L) 11.3 - 14.9 g/dL    HCT 04.5 (L) 40.9 - 44.0 %    MCV 80.1 77.6 - 95.7 fL    MCH 26.0 25.9 - 32.4 pg    MCHC 32.5 32.0 - 36.0 g/dL    RDW 81.1 91.4 - 78.2 %    MPV 10.0 6.8 - 10.7 fL    Platelet 261 150 - 450 10*9/L   Hepatic Function Panel    Collection Time: 10/29/22  4:11 AM   Result Value Ref Range    Albumin 3.6 3.4 - 5.0 g/dL    Total Protein 7.4 5.7 - 8.2 g/dL    Total Bilirubin 0.3 0.3 - 1.2 mg/dL    Bilirubin, Direct 9.56 0.00 - 0.30 mg/dL    AST 22 <=21 U/L    ALT 120 (H) 10 - 49 U/L    Alkaline Phosphatase 246 (H) 46 - 116 U/L   Magnesium Level    Collection Time: 10/29/22  4:11 AM   Result Value Ref Range    Magnesium 1.9 1.6 - 2.6 mg/dL     Labs/Studies:  Labs and Studies from the last 24hrs per EMR and Reviewed      Principal Problem:    Other acute postprocedural pain  Active Problems:    [name]    Intestinal polyps    [name]    Neoplasm related pain    Physical deconditioning    History of colectomy    Intractable abdominal pain    Generalized anxiety disorder with panic attacks    SBO (small bowel obstruction) (CMS-HCC)    Small intestinal bacterial overgrowth (SIBO)

## 2022-10-29 NOTE — Unmapped (Signed)
Ophthalmology Consult Note      Requesting Attending Physician: Lottie Rater  Service Requesting Consult: Med Bernita Raisin [facility])   Consult Attending Physician: Dr. Mitzi Davenport    Assessment:  22 y.o. female with hx of right eye cataract and amblyopia who is presented for a dilated exam in the setting of fungemia.    Ophthalmology was consulted for assistance with evaluation and management.    1. 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    2. Hx of cataract, right eye   3. Hx of amblyopia, right eye   - Pt reports longstanding stability and annual follow up with outside ophthalmologist    Interval update 10/29/22:  - Pt reports a few days of subjective difficulty focusing at near; denies eye pain  - Notes she has not been wearing the contacts she normally wears while being treated for a systemic infection  - Color vision 7/8 right eye and 8/8 left eye but with effort; notes father is color blind  - Structural eye exam entirely unchanged from prior; normal with exception of longstanding cataract right eye     Recommendations  - No acute ophthalmic intervention indicated  - 4x daily artificial tears, additional available PRN (ordered)  - Pt may follow up outpatient with her usual ophthalmologist    Loyal Buba, MD  Ophthalmology PGY-3  October 29, 2022   1:41 PM    __________________________________________________________________    Reason for Consult:  Fungemia    History of Present Illness:  [name] [name] is a 22 y.o. female whom we are asked to see in consultation to rule out fungal endophthalmitis.    [name] [name] had 1/4 blood cultures positive for yeast on 8/13.    She denies any changes in her vision and denies any eye pain.    Hospital Problem List:  Patient Active Problem List   Diagnosis    [name]    Intestinal polyps    [name]    Benign neoplasm of skin of trunk    Other benign neoplasm of connective and other soft tissue of upper limb, including shoulder    History of colonic polyps    [name] of skin    Thyroid cyst    Syncope    Chemotherapy-induced nausea    Neoplasm related pain    Iron deficiency anemia due to chronic blood loss    Dehydration    Physical deconditioning    History of colectomy    Acute deep vein thrombosis (DVT) of axillary vein of right upper extremity (CMS-HCC)    Severe protein-calorie malnutrition (CMS-HCC)    Winged scapula of right side    Lower abdominal pain    Intractable abdominal pain    Generalized anxiety disorder with panic attacks    Hyperglycemia    Hypoglycemia    SBO (small bowel obstruction) (CMS-HCC)    GI bleeding    MDD (major depressive disorder)    Small intestinal bacterial overgrowth (SIBO)    Ileus (CMS-HCC)    Hypersensitivity reaction    Anemia    On total parenteral nutrition (TPN)    Urinary retention    High risk medication use    Other acute postprocedural pain       History:    Past Ocular History:  Cataract right eye that began around age 63 or 5  Right eye amblyopia    Past Medical History:  Past Medical History:  Diagnosis Date    Abdominal pain     Acid reflux     occas    Anesthesia complication     itching, shaking, coldness; last few surgeries have gone much better    Cancer (CMS-HCC)     Cataract of right eye     COVID-19 virus infection 01/2019    Cyst of thyroid determined by ultrasound     monitoring    [name]     2 right forearm, 1 left thigh, 1 right scapula, 1 under left clavicle; multiple    Difficult intravenous access     [name] (familial adenomatous polyposis)     [name]     Gastric polyps     History of chemotherapy     last treatment approx 05/2019    History of colon polyps     History of COVID-19 01/2019    Iron deficiency anemia due to chronic blood loss     received iron infusion 11-2019    PONV (postoperative nausea and vomiting)     Rectal bleeding     Syncopal episodes     especially if becoming dehydrated       Past Surgical History:  Past Surgical History:   Procedure Laterality Date    COLON SURGERY      cyroablation      cystis removal      desmoid removal      PR CLOSE ENTEROSTOMY,RESEC+ANAST N/A 10/09/2020    Procedure: ILEOSTOMY TAKEDOWN;  Surgeon: Mickle Asper, MD;  Location: OR [place];  Service: General Surgery    PR COLONOSCOPY W/BIOPSY SINGLE/MULTIPLE N/A 10/27/2012    Procedure: COLONOSCOPY, FLEXIBLE, PROXIMAL TO SPLENIC FLEXURE; WITH BIOPSY, SINGLE OR MULTIPLE;  Surgeon: Shirlyn Goltz Mir, MD;  Location: PEDS PROCEDURE ROOM [place];  Service: Gastroenterology    PR COLONOSCOPY W/BIOPSY SINGLE/MULTIPLE N/A 09/14/2013    Procedure: COLONOSCOPY, FLEXIBLE, PROXIMAL TO SPLENIC FLEXURE; WITH BIOPSY, SINGLE OR MULTIPLE;  Surgeon: Shirlyn Goltz Mir, MD;  Location: PEDS PROCEDURE ROOM [place];  Service: Gastroenterology    PR COLONOSCOPY W/BIOPSY SINGLE/MULTIPLE N/A 11/08/2014    Procedure: COLONOSCOPY, FLEXIBLE, PROXIMAL TO SPLENIC FLEXURE; WITH BIOPSY, SINGLE OR MULTIPLE;  Surgeon: Arnold Long Mir, MD;  Location: PEDS PROCEDURE ROOM [facility];  Service: Gastroenterology    PR COLONOSCOPY W/BIOPSY SINGLE/MULTIPLE N/A 12/26/2015    Procedure: COLONOSCOPY, FLEXIBLE, PROXIMAL TO SPLENIC FLEXURE; WITH BIOPSY, SINGLE OR MULTIPLE;  Surgeon: Arnold Long Mir, MD;  Location: PEDS PROCEDURE ROOM [facility];  Service: Gastroenterology    PR COLONOSCOPY W/BIOPSY SINGLE/MULTIPLE N/A 09/02/2017    Procedure: COLONOSCOPY, FLEXIBLE, PROXIMAL TO SPLENIC FLEXURE; WITH BIOPSY, SINGLE OR MULTIPLE;  Surgeon: Arnold Long Mir, MD;  Location: PEDS PROCEDURE ROOM ;  Service: Gastroenterology    PR COLSC FLX W/REMOVAL LESION BY HOT BX FORCEPS N/A 08/27/2016    Procedure: COLONOSCOPY, FLEXIBLE, PROXIMAL TO SPLENIC FLEXURE; W/REMOVAL TUMOR/POLYP/OTHER LESION, HOT BX FORCEP/CAUTE;  Surgeon: Arnold Long Mir, MD;  Location: PEDS PROCEDURE ROOM [facility];  Service: Gastroenterology    PR COLSC FLX W/RMVL OF TUMOR POLYP LESION SNARE TQ N/A 02/25/2019    Procedure: COLONOSCOPY FLEX; W/REMOV TUMOR/LES BY SNARE;  Surgeon: Helyn Numbers, MD;  Location: [facility] PROCEDURES [place] [facility];  Service: Gastroenterology    PR COLSC FLX W/RMVL OF TUMOR POLYP LESION SNARE TQ N/A 03/13/2020    Procedure: COLONOSCOPY FLEX; W/REMOV TUMOR/LES BY SNARE;  Surgeon: Helyn Numbers, MD;  Location: [facility] [place] [facility];  Service: Gastroenterology    PR EXC SKIN BENIG 2.1-3 CM  TRUNK,ARM,LEG Right 02/25/2017    Procedure: EXCISION, BENIGN LESION INCLUDE MARGINS, EXCEPT SKIN TAG, LEGS; EXCISED DIAMETER 2.1 TO 3.0 CM;  Surgeon: Clarene Duke, MD;  Location: CHILDRENS OR [facility];  Service: Plastics    PR EXC SKIN BENIG 3.1-4 CM TRUNK,ARM,LEG Right 02/25/2017    Procedure: EXCISION, BENIGN LESION INCLUDE MARGINS, EXCEPT SKIN TAG, ARMS; EXCISED DIAMETER 3.1 TO 4.0 CM;  Surgeon: Clarene Duke, MD;  Location: CHILDRENS OR [facility];  Service: Plastics    PR EXC SKIN BENIG >4 CM FACE,FACIAL Right 02/25/2017    Procedure: EXCISION, OTHER BENIGN LES INCLUD MARGIN, FACE/EARS/EYELIDS/NOSE/LIPS/MUCOUS MEMBRANE; EXCISED DIAM >4.0 CM;  Surgeon: Clarene Duke, MD;  Location: CHILDRENS OR [facility];  Service: Plastics    PR EXC TUMOR SOFT TISSUE LEG/ANKLE SUBQ 3+CM Right 08/05/2019    Procedure: EXCISION, TUMOR, SOFT TISSUE OF LEG OR ANKLE AREA, SUBCUTANEOUS; 3 CM OR GREATER;  Surgeon: Arsenio Katz, MD;  Location: MAIN OR [place];  Service: Plastics    PR EXC TUMOR SOFT TISSUE LEG/ANKLE SUBQ <3CM Right 08/05/2019    Procedure: EXCISION, TUMOR, SOFT TISSUE OF LEG OR ANKLE AREA, SUBCUTANEOUS; LESS THAN 3 CM;  Surgeon: Arsenio Katz, MD;  Location: MAIN OR [facility];  Service: Plastics    PR LAP, SURG PROCTECTOMY W J-POUCH N/A 08/10/2020    Procedure: ROBOTIC ASSISTED LAPAROSCOPIC PROCTOCOLECTOMY, ILEAL J POUCH, WITH OSTOMY;  Surgeon: Mickle Asper, MD;  Location: OR [place];  Service: General Surgery    PR NDSC EVAL INTSTINAL POUCH DX W/COLLJ SPEC SPX N/A 01/23/2021    Procedure: ENDO EVAL SM INTEST POUCH; DX; Surgeon: Modena Nunnery, MD;  Location: [facility] [place] [facility];  Service: Gastroenterology    PR NDSC EVAL INTSTINAL POUCH DX W/COLLJ SPEC SPX N/A 08/27/2021    Procedure: ENDO EVAL SM INTEST POUCH; DX;  Surgeon: Hunt Oris, MD;  Location: [facility] PROC[facility]URES [facility];  Service: Gastroenterology    PR NDSC EVAL INTSTINAL POUCH DX W/COLLJ SPEC SPX N/A 12/09/2021    Procedure: ENDO EVAL SM INTEST POUCH; DX;  Surgeon: Vidal Schwalbe, MD;  Location: GI PROCEDURES [facility];  Service: Gastroenterology    PR NDSC EVAL INTSTINAL POUCH DX W/COLLJ [facility] SPX Left 04/09/2022    Procedure: ENDO EVAL SM INTEST POUCH; DX;  Surgeon: Modena Nunnery, MD;  Location: [facility] [place] [facility];  Service: Gastroenterology    PR NDSC EVAL INTSTINAL POUCH DX W/COLLJ SPEC SPX N/A 08/05/2022    Procedure: ENDO EVAL SM INTEST POUCH; DX;  Surgeon: Modena Nunnery, MD;  Location: [facility] PROCEDURES [facility];  Service: Gastroenterology    PR NDSC EVAL INTSTINAL POUCH W/BX SINGLE/MULTIPLE N/A 01/20/2022    Procedure: ENDOSCOPIC EVAL OF SMALL INTESTINAL POUCH; DIAGNOSTIC, No biopsies;  Surgeon: Andrey Farmer, MD;  Location: GI PROCEDURES [facility];  Service: Gastroenterology    PR NDSC EVAL INTSTINAL POUCH W/BX SINGLE/MULTIPLE N/A 02/13/2022    Procedure: ENDOSCOPIC EVAL OF SMALL INTESTINAL POUCH; DIAGNOSTIC, WITH BIOPSY;  Surgeon: Bronson Curb, MD;  Location: GI PROCEDURES [facility];  Service: Gastroenterology    PR UNLISTED PROCEDURE SMALL INTESTINE  01/23/2021    Procedure: UNLISTED PROCEDURE, SMALL INTESTINE;  Surgeon: Modena Nunnery, MD;  Location: [facility] [place] [facility];  Service: Gastroenterology    PR UNLISTED PROCEDURE SMALL INTESTINE  02/13/2022    Procedure: UNLISTED PROCEDURE, SMALL INTESTINE;  Surgeon: Bronson Curb, MD;  Location: GI PROC[facility]URES [facility];  Service: Gastroenterology    PR UPPER GI ENDOSCOPY,BIOPSY N/A 10/27/2012  Procedure: UGI ENDOSCOPY; WITH BIOPSY, SINGLE OR MULTIPLE;  Surgeon: Shirlyn Goltz Mir, MD;  Location: PEDS PROCEDURE ROOM [facility];  Service: Gastroenterology    PR UPPER GI ENDOSCOPY,BIOPSY N/A 09/14/2013    Procedure: UGI ENDOSCOPY; WITH BIOPSY, SINGLE OR MULTIPLE;  Surgeon: Shirlyn Goltz Mir, MD;  Location: PEDS PROCEDURE ROOM [facility];  Service: Gastroenterology    PR UPPER GI ENDOSCOPY,BIOPSY N/A 11/08/2014    Procedure: UGI ENDOSCOPY; WITH BIOPSY, SINGLE OR MULTIPLE;  Surgeon: Arnold Long Mir, MD;  Location: PEDS PROCEDURE ROOM [facility];  Service: Gastroenterology    PR UPPER GI ENDOSCOPY,BIOPSY N/A 12/26/2015    Procedure: UGI ENDOSCOPY; WITH BIOPSY, SINGLE OR MULTIPLE;  Surgeon: Arnold Long Mir, MD;  Location: PEDS PROCEDURE ROOM [facility];  Service: Gastroenterology    PR UPPER GI ENDOSCOPY,BIOPSY N/A 08/27/2016    Procedure: UGI ENDOSCOPY; WITH BIOPSY, SINGLE OR MULTIPLE;  Surgeon: Arnold Long Mir, MD;  Location: PEDS PROCEDURE ROOM [facility];  Service: Gastroenterology    PR UPPER GI ENDOSCOPY,BIOPSY N/A 09/02/2017    Procedure: UGI ENDOSCOPY; WITH BIOPSY, SINGLE OR MULTIPLE;  Surgeon: Arnold Long Mir, MD;  Location: PEDS PROCEDURE ROOM [facility];  Service: Gastroenterology    PR UPPER GI ENDOSCOPY,BIOPSY N/A 03/13/2020    Procedure: UGI ENDOSCOPY; WITH BIOPSY, SINGLE OR MULTIPLE;  Surgeon: Helyn Numbers, MD;  Location: [facility] [place] [facility];  Service: Gastroenterology    PR UPPER GI ENDOSCOPY,BIOPSY N/A 09/05/2021    Procedure: UGI ENDOSCOPY; WITH BIOPSY, SINGLE OR MULTIPLE;  Surgeon: Wendall Papa, MD;  Location: GI PROC[facility]URES [facility];  Service: Gastroenterology    PR UPPER GI ENDOSCOPY,DIAGNOSIS N/A 01/20/2022    Procedure: UGI ENDO, INCLUDE ESOPHAGUS, STOMACH, & DUODENUM &/OR JEJUNUM; DX W/WO COLLECTION SPECIMN, BY BRUSH OR WASH;  Surgeon: Andrey Farmer, MD;  Location: GI PROCEDURES [facility];  Service: Gastroenterology    TUMOR REMOVAL      multiple-head, neck, back, hand, right flank, multiple Family History:  Negative family ocular history    Social History:  Social History     Socioeconomic History    Marital status: Single   Tobacco Use    Smoking status: Never     Passive exposure: Past    Smokeless tobacco: Never   Vaping Use    Vaping status: Never Used   Substance and Sexual Activity    Alcohol use: Never    Drug use: Never    Sexual activity: Never   Other Topics Concern    Do you use sunscreen? Yes    Tanning bed use? No    Are you easily burned? No    Excessive sun exposure? No    Blistering sunburns? 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     Social Determinants of Health     Financial Resource Strain: Low Risk  (09/26/2022)    Overall Financial Resource Strain (CARDIA)     Difficulty of Paying Living Expenses: Not very hard   Food Insecurity: No Food Insecurity (09/26/2022)    Hunger Vital Sign     Worried About Running Out of Food in the Last Year: Never true     Ran Out of Food in the Last Year: Never true   Transportation Needs: No Transportation Needs (09/26/2022)    PRAPARE - Therapist, art (Medical): No     Lack of Transportation (Non-Medical): No  Unable to assess tobacco use    Medications:  Scheduled Meds:   [Provider Hold] baclofen  20 mg Oral TID    bisacodyl  10 mg Rectal BID    buprenorphine-naloxone  0.25 Film Sublingual TID    ceFAZolin  2 g Intravenous Q8H    cetirizine  10 mg Enteral tube: gastric Daily    diclofenac sodium  2 g Topical QID    enoxaparin (LOVENOX) injection  40 mg Subcutaneous Q24H SCH    famotidine  20 mg Oral Nightly    micafungin  100 mg Intravenous Q24H    mirtazapine  15 mg Enteral tube: gastric Nightly    naloxegol  25 mg Oral Daily    [Provider Hold] nirogacestat  50 mg Oral BID    pantoprazole  40 mg Oral BID    polyethylene glycol  17 g Oral BID    senna  2 tablet Oral Nightly     Continuous Infusions:   Chemo Clarification Order      IP okay to treat      sodium chloride       PRN Meds:.acetaminophen, alum-mag-simeth, calcium carbonate, Chemo Clarification Order, guaiFENesin, hydrocortisone, HYDROmorphone, IP okay to treat, lidocaine, LORazepam, LORazepam, lidocaine-diphenhydrAMINE-aluminum-magnesium, ondansetron, oxyCODONE, phenol, polyethylene glycol, promethazine, pseudoephedrine, zinc oxide-cod liver oil    Allergies:  Allergies   Allergen Reactions    Adhesive Tape-Silicones Hives and Rash     Paper tape and Tegederm ok. Biopatch causes blistering but has tolerated CHG Tegaderm    Ferrlecit [Sodium Ferric Gluconat-Sucrose] Swelling and Rash    Levofloxacin Swelling and Rash     Swelling in mouth, rash,     Methylnaltrexone      Per Patient: I lost bowel control, severe abdominal cramping, and elevated BP    Neomycin Swelling     Rxn after ear drops; ear swelling    Papaya Hives    Morphine Nausea And Vomiting    Zosyn [Piperacillin-Tazobactam] Itching and Rash     Red and itchy    Compazine [Prochlorperazine] Other (See Comments)     Extreme agitation    Iron Analogues     Iron Dextran Itching     Received iron dextran 06/08/22 over 12 hours, had itching and redness/flushing during the infusion and for a couple days after. Required IV benadryl w/flares between doses and ultimately treated w/IV methylpred for 2 days.     Latex, Natural Rubber Rash       Review of Systems:  12 systems reviewed and negative unless otherwise stated in HPI or recent HPI    Physical Exam:  Temp Readings from Last 2 Encounters:   10/29/22 36.7 ??C (98.1 ??F)   09/16/22 36.6 ??C (97.9 ??F)     BP Readings from Last 2 Encounters:   10/29/22 114/55   09/24/22 122/90     Pulse Readings from Last 2 Encounters:   10/29/22 88   09/24/22 97     Resp Readings from Last 2 Encounters:   10/29/22 13   09/02/22 14     SpO2 Readings from Last 1 Encounters:   10/29/22 99%       General:   No acute distress    Neuro/Psych:  Alert and oriented to person, place, and time    Ophthalmic Exam:  Base Eye Exam       Visual Acuity (Snellen - Linear)         Right Left    [place] 20/50-2 20/20   Right  eye with known amblyopia             Tonometry ([facility], 3:27 PM)         Right Left    Pressure 17 18              Pupils         Shape React APD    Right Round Brisk None    Left Round Brisk None              Visual Fields (Counting fingers)         Left Right     Full Full              Extraocular Movement         Right Left     Full, Ortho Full, Ortho              Neuro/Psych       Oriented x3: Yes    Mood/Affect: Normal              Dilation       Both eyes: 1% Tropicamide, 10% Phenylephrine @ 3:29 PM                  Slit Lamp and Fundus Exam       External Exam         Right Left    External Normal Normal              Slit Lamp Exam         Right Left    Lids/Lashes Normal Normal    Conjunctiva/Sclera White and quiet White and quiet    Cornea Clear Clear    Anterior Chamber Deep and quiet Deep and quiet    Iris Round and reactive Round and reactive    Lens Superocentral posterior polar cataract Clear    Anterior Vitreous Normal Normal              Fundus Exam         Right Left    Disc Normal Normal    C/D Ratio 0.2 0.2    Macula Normal Normal    Vessels Normal Normal    Periphery Normal, no vitreitis or retinitis Normal, no vitreitis or retinitis                    Diagnostic Testing:  All pertinent labs and imaging results reviewed    __________________________________________________________________

## 2022-10-29 NOTE — Unmapped (Signed)
Physical Medicine and Rehab  Inpatient Consult Note    Requesting Attending Physician: [name], MD  Service Requesting Consult: Med [name], [name], 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 Patient is currently hospitalized since 09/25/2022. The patient is seen in consultation for evaluation of rehabilitation needs.    Checklist for Potential AIR:   [ ]  EDD/what is estimated discharge date?  [ ]  Referral from CM if wants [place] AIR  [ ]  PT/OT within 48 hours of AIR  [ ]  CBC/BMP & other pertinent labs within 48 hours of AIR  [ ]  Prefer oral pain meds only, but if on IV pain meds, then no more than q 6-8 hours prn  [ ]  dispo verified and [place] AIR preference  [ ]  Currently necessitating TPN.  Needs finalized TPN and NGT plan. Will need a TPN transition note prior to discharge.  [ ]  Documentation of therapy tolerance    Dispo: home with family. She previously was independent with basic ADLs. Has MSK limitations which required some assistance. She is in RN school    Principal Medical Problem:  Other acute postprocedural pain    Increased Tonicity  Dystonia   Started post-procedurally per patient ~09/25/22. Occurs intermittently throughout the day with a heightened occurrence during mobilization. At rest patient bilateral ankles in plantarflexion with worsening heel cord tightening. Has tonicity most prominent to HF and KE that is not velocity dependent. Has noted some improvement with increase of baclofen to 20 mg TID and use of ativan. Differential wise, Neurology previously evaluated patient seemingly ruling out an acute dystonic reaction, cord compression, central process, paraneoplastic etiology and nutritional deficiency. Currently pending AI/myelopathy panel to evaluate for stiff person syndrome however atypical to present with acute onset. Additional concern for increased titer of calcium channel antibody testing and Pincus Badder syndrome remains on differential. Pending EMG potentially on 8/22.   - Pending EMG on 8/22  - Agree with holding Baclofen pending EMG  - At risk for contractures, Recommend ordering bilateral hard PRAFO boots  - Fall risk precautions  - Recommend OOB to chair daily with assistance if appropriate  - AI/myelopathy panel pending 8/18   - PO Ativan 0.5 mg TID PRN for anxiety and IV Ativan 1 mg q8h prn for resistant nausea and vomiting may help with dystonia     Headache  Currently managed with tylenol. She does have some posterior neck muscle tightness. Notes ketamine is helpful for headache pains. Describes photophobia and phonophobia with events. I discussed with patient our hope is to decrease risk of rebound headaches and limit medical issues which will interfere with therapy. An additional thought is patient's headaches  could be cervicogenic. She does have loss of cervical lordosis on most recent MRI.   - Continue Prn tylenol and prn fiorcet   - Continue Lidocaine cream PRN to area of pain for cervicogenic headaches    Pain  - Tylenol 650 mg q6h PRN  - IV Dilaudid 1 mg q3h PRN  - Oxyocodone 10 mg q4h PRN  - Suboxone 0.5 mg TID  - Recommend starting Gabapentin 100 mg TID, can increase to 300mg  TID as tolerated    Multifactorial Ileus   [place] Emesis 2/2 NGT placement (resolved)  Contributors with history of narcotics, poor PO intake. Concern for malnutrition. Improvements to abdominal pain since weaning opioids. TPN has been discontinued with patient now tolerating tube feeds  -Continue Tube feeds as guided by nutrition   -bowel regimen: miralax 17g BID, senna 2 tablets nightly, suppository BID  -NG tube in place  -Trialing food/meds PO prior to NG tube removal   -PO Protonix 40 mg BID     IDA  -has allergy to formula iron     [name] - Desmoid Tumors  Dr. [name] is patient's outpatient oncologist.   - Hold nirogacestat 50 mg BID in setting of infection as below    GNR bacteremia - Candida Fungemia  Source presumably her central line that she was getting TPN through. She is no longer having fevers and is clinically slowly improving. Central line exchanged 8/14. TEE on 8/20 without vegetations per primary team. ID consulted with recommendations as below.   - Central line exchanged 8/14 while on broad spectrum antibacterial coverage but not fungal  - Repeat cultures ordered for 8/17 AM (72 hours given shortage)  - Continue Cefazolin (8/20-TBD) s/p cefepime(8/13-8/20) and flagyl (8/13-8/16)  - Continue micafungin (8/14- )  - Tylenol prn fever, bolus fluids prn    Other Medical Considerations:  While in AIR, the patient has medical issues requiring active management/supervision by a rehabilitation physician, with face-to-face visits at least 3 days per week to assess the patient both medically and functionally and to modify the course of treatment as needed; The Physiatrist leads an intensive and coordinated interdisciplinary team approach to the delivery of inpatient rehabilitative care that cannot be provided in a less intensive setting such as the home, outpatient center or SNF. If not done in hospital, patient is at high risk for falls, respiratory failure, bleeding, infections, or delirium.  Risk for VTE    Patient's Functional Goals  Increase mobility, return to RN school    Functional Status Updates:  Prior Functional Status:    Prior Functional Status: Pt reports following her last hospitalization she was able to go to the beach briefly and ambulate on the sand. Was also able to play mini-golf but unable to bend down to grab a ball. Since her egg retrieval and the initiation of her new chemo medication, she has been spending most of her time in bed. No falls in the past month, but did have a few falls last hospitalization due to syncope.    Current Functional Status: Therapy notes reviewed and analyzed   Activities of Daily Living:   Assessment  Problem List: Decreased strength, Impaired ADLs, Decreased activity tolerance, Decreased mobility, Fall risk, Gait deviation, Impaired balance, Pain, Decreased endurance, Decreased coordination  Assessment: Pt continues to demonstrate anxiety around fear of infection, lines being dislodged, or pain.Pt also expressed frustration with recent blood infection and her set backs. Pt educated and encouraged to continues doing things that are normal even  on days she feeling bad to promote her physical function as well as her mental. She frequently perseverates on her ongoing medical needs. pt continues to benefit from skilled acute OT services to maximize independence for safe and successful d/c. Will continue to follow  Today's Interventions: ADL retraining, Balance activities, Bed mobility, Compensatory tech. training, Conservation, Education - Patient, Education - Family / caregiver, Endurance activities, Safety education  Chlorhexidine Treatment: Given  Today's Interventions: Educated on role of OT, OT POC, anxiety management, PLB, activity pacing, seated rest breaks. Patient performed bed mobility, sit<>stand transitions, step up/down, functional mobility, RW management            Mobility:   Bed Mobility comments: Supine to sit with SBA, HOB elevated and increased time to complete. Pt utilizing LUE for support but protecting R wrist. Sit to supine with Min A for LEs.        Skilled Treatment Performed: see gait goal  PT Post Acute Discharge Recommendations: Skilled PT services indicated, 5x weekly, High intensity  Cognition, Swallow, Speech:   Cognition / Swallow / Speech  Patient's Vision Adequate to Safely Complete Daily Activities: Yes  Patient's Judgement Adequate to Safely Complete Daily Activities: Yes  Patient's Memory Adequate to Safely Complete Daily Activities: Yes  Patient Able to Express Needs/Desires: Yes  Patient has speech problem: No           Assistive Devices: Rollator  Precautions:  Safety Interventions  Safety Interventions: aspiration precautions, commode/urinal/bedpan at bedside, fall reduction program maintained, lighting adjusted for tasks/safety, low bed, nonskid shoes/slippers when out of bed  Aspiration Precautions: oral hygiene care promoted, respiratory status monitored, tube feeding placement verified  Bleeding Precautions: blood pressure closely monitored, foot protection facilitated, gentle oral care promoted  Infection Management: aseptic technique maintained  Isolation Precautions: protective precautions maintained  Latex Precautions: latex precautions maintained    Functional Impairments and Recommendations  - Please continue to have patient work with PT, OT, and SLP to maximize functional status with mobility, ADLs, and cogniton/swallow function.  - Fall risk precautions  - Recommend OOB to chair daily with assistance if appropriate    Rehabilitation Plan of Care  - Patient has complex rehab, nursing, and medical needs and may become appropriate for Acute Inpatient Rehabilitation with regular therapy participation / demonstrated progress, completion of medical workup / formulation of treatment plan, and once transitioned exclusively to oral pain medication.  In AIR the patient would be expected to tolerate minimum of 3 hrs of therapy daily, 5x/week.    -Discussed rehab previously with patient and patient's mother. The patient/family/caregiver was counseled and educated on the purpose of Acute Inpatient Rehabilitation and questions/concerns were addressed.     In order for the patient to be considered for admission to [facility] inpatient rehab, the case manager must give the patient / family choice in post-acute discharge options. If the patient desires [place], a referral from case management is required for acceptance to [facility] inpatient rehab.  A referral for [facility] inpatient rehab has not been received.     Please page the consult pager from 8AM-5PM weekdays or the first call pager after hours/weekends/holidays with questions.    The patient was seen and examined with Dr. Landry Dyke, MD, who is in agreement with the above stated assessment and plan.    Thank you for this consult    SUBJECTIVE     Reason for Consult: Patient seen in consultation at the request of [name], MD for evaluation of  rehabilitation needs and recommendations.    History of Present Illness: [name] [name] is a 22 y.o. female currently hospitalized on 09/25/2022 for ileus and inability to tolerate PO intake.    Today  Patient states that she is tired this morning due to poor sleep overnight.  Feels that dystonic movements are slightly improved with increase in baclofen and use of PRN ativan. Pain continues to be limiting especially when getting up with therapy and often makes tightness and dystonic posturing worse. States she has been tolerating tube feeds okay and attempting to increase PO intake but limited by frequent exams requiring NPO.     Medical / Surgical History:   Past Medical History:   Diagnosis Date    Abdominal pain     Acid reflux     occas    Anesthesia complication     itching, shaking, coldness; last few surgeries have gone much better    Cancer (CMS-HCC)     Cataract of right eye     COVID-19 virus infection 01/2019    Cyst of thyroid determined by ultrasound     monitoring    [name]     2 right forearm, 1 left thigh, 1 right scapula, 1 under left clavicle; multiple    Difficult intravenous access     [name] (familial adenomatous polyposis)     [name]     Gastric polyps     History of chemotherapy     last treatment approx 05/2019    History of colon polyps     History of COVID-19 01/2019    Iron deficiency anemia due to chronic blood loss     received iron infusion 11-2019    PONV (postoperative nausea and vomiting)     Rectal bleeding     Syncopal episodes     especially if becoming dehydrated     Past Surgical History:   Procedure Laterality Date    COLON SURGERY      cyroablation      cystis removal      desmoid removal      PR CLOSE ENTEROSTOMY,RESEC+ANAST N/A 10/09/2020    Procedure: ILEOSTOMY TAKEDOWN;  Surgeon: Mickle Asper, MD;  Location: OR [place];  Service: General Surgery    PR COLONOSCOPY W/BIOPSY SINGLE/MULTIPLE N/A 10/27/2012    Procedure: COLONOSCOPY, FLEXIBLE, PROXIMAL TO SPLENIC FLEXURE; WITH BIOPSY, SINGLE OR MULTIPLE;  Surgeon: Shirlyn Goltz Mir, MD;  Location: PEDS PROCEDURE ROOM [place];  Service: Gastroenterology    PR COLONOSCOPY W/BIOPSY SINGLE/MULTIPLE N/A 09/14/2013    Procedure: COLONOSCOPY, FLEXIBLE, PROXIMAL TO SPLENIC FLEXURE; WITH BIOPSY, SINGLE OR MULTIPLE;  Surgeon: Shirlyn Goltz Mir, MD;  Location: PEDS PROCEDURE ROOM [place];  Service: Gastroenterology    PR COLONOSCOPY W/BIOPSY SINGLE/MULTIPLE N/A 11/08/2014    Procedure: COLONOSCOPY, FLEXIBLE, PROXIMAL TO SPLENIC FLEXURE; WITH BIOPSY, SINGLE OR MULTIPLE;  Surgeon: Arnold Long Mir, MD;  Location: PEDS PROCEDURE ROOM [facility];  Service: Gastroenterology    PR COLONOSCOPY W/BIOPSY SINGLE/MULTIPLE N/A 12/26/2015    Procedure: COLONOSCOPY, FLEXIBLE, PROXIMAL TO SPLENIC FLEXURE; WITH BIOPSY, SINGLE OR MULTIPLE;  Surgeon: Arnold Long Mir, MD;  Location: PEDS PROCEDURE ROOM [facility];  Service: Gastroenterology PR COLONOSCOPY W/BIOPSY SINGLE/MULTIPLE N/A 09/02/2017    Procedure: COLONOSCOPY, FLEXIBLE, PROXIMAL TO SPLENIC FLEXURE; WITH BIOPSY, SINGLE OR MULTIPLE;  Surgeon: Arnold Long Mir, MD;  Location: PEDS PROCEDURE ROOM [place];  Service: Gastroenterology    PR COLSC FLX W/REMOVAL LESION BY HOT BX FORCEPS N/A 08/27/2016    Procedure: COLONOSCOPY, FLEXIBLE, PROXIMAL TO  SPLENIC FLEXURE; W/REMOVAL TUMOR/POLYP/OTHER LESION, HOT BX FORCEP/CAUTE;  Surgeon: Arnold Long Mir, MD;  Location: PEDS PROCEDURE ROOM [facility];  Service: Gastroenterology    PR COLSC FLX W/RMVL OF TUMOR POLYP LESION SNARE TQ N/A 02/25/2019    Procedure: COLONOSCOPY FLEX; W/REMOV TUMOR/LES BY SNARE;  Surgeon: Helyn Numbers, MD;  Location: GI PROCEDURES [place] [facility];  Service: Gastroenterology    PR COLSC FLX W/RMVL OF TUMOR POLYP LESION SNARE TQ N/A 03/13/2020    Procedure: COLONOSCOPY FLEX; W/REMOV TUMOR/LES BY SNARE;  Surgeon: Helyn Numbers, MD;  Location: GI PROCEDURES [place] [facility]  Service: Gastroenterology    PR EXC SKIN BENIG 2.1-3 CM TRUNK,ARM,LEG Right 02/25/2017    Procedure: EXCISION, BENIGN LESION INCLUDE MARGINS, EXCEPT SKIN TAG, LEGS; EXCISED DIAMETER 2.1 TO 3.0 CM;  Surgeon: Clarene Duke, MD;  Location: CHILDRENS OR [facility];  Service: Plastics    PR EXC SKIN BENIG 3.1-4 CM TRUNK,ARM,LEG Right 02/25/2017    Procedure: EXCISION, BENIGN LESION INCLUDE MARGINS, EXCEPT SKIN TAG, ARMS; EXCISED DIAMETER 3.1 TO 4.0 CM;  Surgeon: Clarene Duke, MD;  Location: CHILDRENS OR [facility];  Service: Plastics    PR EXC SKIN BENIG >4 CM FACE,FACIAL Right 02/25/2017    Procedure: EXCISION, OTHER BENIGN LES INCLUD MARGIN, FACE/EARS/EYELIDS/NOSE/LIPS/MUCOUS MEMBRANE; EXCISED DIAM >4.0 CM;  Surgeon: Clarene Duke, MD;  Location: CHILDRENS OR [facility];  Service: Plastics    PR EXC TUMOR SOFT TISSUE LEG/ANKLE SUBQ 3+CM Right 08/05/2019    Procedure: EXCISION, TUMOR, SOFT TISSUE OF LEG OR ANKLE AREA, SUBCUTANEOUS; 3 CM OR GREATER;  Surgeon: Arsenio Katz, MD;  Location: MAIN OR [place];  Service: Plastics    PR EXC TUMOR SOFT TISSUE LEG/ANKLE SUBQ <3CM Right 08/05/2019    Procedure: EXCISION, TUMOR, SOFT TISSUE OF LEG OR ANKLE AREA, SUBCUTANEOUS; LESS THAN 3 CM;  Surgeon: Arsenio Katz, MD;  Location: MAIN OR [facility];  Service: Plastics    PR LAP, SURG PROCTECTOMY W J-POUCH N/A 08/10/2020    Procedure: ROBOTIC ASSISTED LAPAROSCOPIC PROCTOCOLECTOMY, ILEAL J POUCH, WITH OSTOMY;  Surgeon: Mickle Asper, MD;  Location: OR [place];  Service: General Surgery    PR NDSC EVAL INTSTINAL POUCH DX W/COLLJ SPEC SPX N/A 01/23/2021    Procedure: ENDO EVAL SM INTEST POUCH; DX;  Surgeon: Modena Nunnery, MD;  [facility]: [facility] [place] [facility];  Service: Gastroenterology    PR NDSC EVAL INTSTINAL POUCH DX W/COLLJ SPEC SPX N/A 08/27/2021    Procedure: ENDO EVAL SM INTEST POUCH; DX;  Surgeon: Hunt Oris, MD;  [facility];  Service: Gastroenterology    PR NDSC EVAL INTSTINAL POUCH DX W/COLLJ SPEC SPX N/A 12/09/2021    Procedure: ENDO EVAL SM INTEST POUCH; DX;  Surgeon: Vidal Schwalbe, MD;  [facility]: [facility]URES [facility];  Service: Gastroenterology    PR NDSC EVAL INTSTINAL POUCH DX W/COLLJ [facility] SPX Left 04/09/2022    Procedure: ENDO EVAL SM INTEST POUCH; DX;  Surgeon: Modena Nunnery, MD;  Location: [facility] [place] [facility];  Service: Gastroenterology    PR NDSC EVAL INTSTINAL POUCH DX W/COLLJ SPEC SPX N/A 08/05/2022    Procedure: ENDO EVAL SM INTEST POUCH; DX;  Surgeon: Modena Nunnery, MD;  Location: GI PROCEDURES [facility];  Service: Gastroenterology    PR NDSC EVAL INTSTINAL POUCH W/BX SINGLE/MULTIPLE N/A 01/20/2022    Procedure: ENDOSCOPIC EVAL OF SMALL INTESTINAL POUCH; DIAGNOSTIC, No biopsies;  Surgeon: Andrey Farmer, MD;  Location: GI PROCEDURES [facility];  Service: Gastroenterology  PR NDSC EVAL INTSTINAL POUCH W/BX SINGLE/MULTIPLE N/A 02/13/2022 Procedure: ENDOSCOPIC EVAL OF SMALL INTESTINAL POUCH; DIAGNOSTIC, WITH BIOPSY;  Surgeon: Bronson Curb, MD;  Location: GI PROCEDURES [facility];  Service: Gastroenterology    PR UNLISTED PROCEDURE SMALL INTESTINE  01/23/2021    Procedure: UNLISTED PROCEDURE, SMALL INTESTINE;  Surgeon: Modena Nunnery, MD;  Location: [facility] [place] [facility];  Service: Gastroenterology    PR UNLISTED PROCEDURE SMALL INTESTINE  02/13/2022    Procedure: UNLISTED PROCEDURE, SMALL INTESTINE;  Surgeon: Bronson Curb, MD;  Location: GI PROCEDURES [facility];  Service: Gastroenterology    PR UPPER GI ENDOSCOPY,BIOPSY N/A 10/27/2012    Procedure: UGI ENDOSCOPY; WITH BIOPSY, SINGLE OR MULTIPLE;  Surgeon: Shirlyn Goltz Mir, MD;  Location: PEDS PROCEDURE ROOM [facility];  Service: Gastroenterology    PR UPPER GI ENDOSCOPY,BIOPSY N/A 09/14/2013    Procedure: UGI ENDOSCOPY; WITH BIOPSY, SINGLE OR MULTIPLE;  Surgeon: Shirlyn Goltz Mir, MD;  Location: PEDS PROCEDURE ROOM [facility];  Service: Gastroenterology    PR UPPER GI ENDOSCOPY,BIOPSY N/A 11/08/2014    Procedure: UGI ENDOSCOPY; WITH BIOPSY, SINGLE OR MULTIPLE;  Surgeon: Arnold Long Mir, MD;  Location: PEDS PROCEDURE ROOM [facility];  Service: Gastroenterology    PR UPPER GI ENDOSCOPY,BIOPSY N/A 12/26/2015    Procedure: UGI ENDOSCOPY; WITH BIOPSY, SINGLE OR MULTIPLE;  Surgeon: Arnold Long Mir, MD;  Location: PEDS PROCEDURE ROOM [facility];  Service: Gastroenterology    PR UPPER GI ENDOSCOPY,BIOPSY N/A 08/27/2016    Procedure: UGI ENDOSCOPY; WITH BIOPSY, SINGLE OR MULTIPLE;  Surgeon: Arnold Long Mir, MD;  Location: PEDS PROCEDURE ROOM [facility];  Service: Gastroenterology    PR UPPER GI ENDOSCOPY,BIOPSY N/A 09/02/2017    Procedure: UGI ENDOSCOPY; WITH BIOPSY, SINGLE OR MULTIPLE;  Surgeon: Arnold Long Mir, MD;  Location: PEDS PROCEDURE ROOM [facility];  Service: Gastroenterology    PR UPPER GI ENDOSCOPY,BIOPSY N/A 03/13/2020    Procedure: UGI ENDOSCOPY; WITH BIOPSY, SINGLE OR MULTIPLE;  Surgeon: Helyn Numbers, MD;  Location: [facility] PROCEDURES [place] [facility];  Service: Gastroenterology    PR UPPER GI ENDOSCOPY,BIOPSY N/A 09/05/2021    Procedure: UGI ENDOSCOPY; WITH BIOPSY, SINGLE OR MULTIPLE;  Surgeon: Wendall Papa, MD;  Location: GI PROC[facility]URES [facility];  Service: Gastroenterology    PR UPPER GI ENDOSCOPY,DIAGNOSIS N/A 01/20/2022    Procedure: UGI ENDO, INCLUDE ESOPHAGUS, STOMACH, & DUODENUM &/OR JEJUNUM; DX W/WO COLLECTION SPECIMN, BY BRUSH OR WASH;  Surgeon: Andrey Farmer, MD;  Location: GI PROCEDURES [facility];  Service: Gastroenterology    TUMOR REMOVAL      multiple-head, neck, back, hand, right flank, multiple        Social History:   Social History     Tobacco Use    Smoking status: Never     Passive exposure: Past    Smokeless tobacco: Never   Vaping Use    Vaping status: Never Used   Substance Use Topics    Alcohol use: Never    Drug use: Never       Living Environment: House  Lives With: Mother, Father  Home Living: Multi-level home, Bed/bath upstairs, 1/2 bath on main level, Stairs to alternate level with rails, Stairs to enter without rails, Stairs to alternate level without rails, Handicapped height toilet, Tub/shower unit, Able to Live on main level with bedroom/bathroom  Number of Stairs to Enter (outside): 2  Rail placement (inside): Rail on right side  Number of Stairs to Alternate level (inside): 14  Caregiver Identified?: Yes  Caregiver Availability: 24 hours  Caregiver Ability:  Limited lifting    Family History: Reviewed  family history includes Alcohol abuse in her paternal grandfather; Arthritis in her maternal grandfather; Asthma in her maternal grandfather; COPD in her paternal grandmother; Cancer in her maternal grandmother; Diabetes in her maternal grandmother; Hypertension in her maternal grandmother; Miscarriages / Stillbirths in her paternal grandmother; No Known Problems in her brother, father, maternal aunt, maternal uncle, mother, paternal aunt, paternal uncle, and sister; Other in her maternal grandmother; Stroke in her maternal grandmother; Thyroid disease in her maternal grandmother.    Allergies:   Adhesive tape-silicones; Ferrlecit [sodium ferric gluconat-sucrose]; Levofloxacin; Methylnaltrexone; Neomycin; Papaya; Morphine; Zosyn [piperacillin-tazobactam]; Compazine [prochlorperazine]; Iron analogues; Iron dextran; and Latex, natural rubber    Medications:   Scheduled    baclofen (LIORESAL) tablet 20 mg TID    bisacodyl (DULCOLAX) suppository 10 mg BID    buprenorphine-naloxone (SUBOXONE) 2-0.5 mg SL film 0.5 mg of buprenorphine TID    ceFAZolin (ANCEF) IVPB 2 g in 50 ml dextrose (premix) Q8H    cetirizine (ZYRTEC) tablet 10 mg Daily    diclofenac sodium (VOLTAREN) 1 % gel 2 g QID    enoxaparin (LOVENOX) syringe 40 mg Q24H SCH    famotidine (PEPCID) tablet 20 mg Nightly    micafungin (MYCAMINE) 100 mg in sodium chloride 0.9 % (NS) 100 mL IVPB-MBP Q24H    mirtazapine (REMERON) tablet 15 mg Nightly    naloxegol (MOVANTIK) tablet 25 mg Daily    [Provider Hold] nirogacestat (OGSIVEO) tablet 50 mg *Patient Supplied* BID    pantoprazole (Protonix) oral suspension BID    polyethylene glycol (MIRALAX) packet 17 g BID    senna (SENOKOT) tablet 2 tablet Nightly    sodium chloride 0.9% (NS) bolus 500 mL Once     PRN acetaminophen, 650 mg, Q6H PRN  aluminum-magnesium hydroxide-simethicone, 60 mL, Q6H PRN  calcium carbonate, 400 mg elem calcium, Daily PRN  Chemo Clarification Order, , Continuous PRN  guaiFENesin, 200 mg, Q4H PRN  hydrocortisone, , BID PRN  HYDROmorphone, 1 mg, Q3H PRN  IP okay to treat, , Continuous PRN  lidocaine, , TID PRN  LORazepam, 1 mg, BID PRN  LORazepam, 0.5 mg, TID PRN  lidocaine-diphenhydrAMINE-aluminum-magnesium, 10 mL, QID PRN  ondansetron, 4 mg, Q12H PRN  oxyCODONE, 10 mg, Q4H PRN  phenol, 2 spray, Q2H PRN  polyethylene glycol, 17 g, Daily PRN  promethazine, 6.5 mg, Q6H PRN  pseudoephedrine, 30 mg, Daily PRN  zinc oxide-cod liver oil, , Daily PRN      Continuous Infusions Chemo Clarification Order  IP okay to treat  sodium chloride        OBJECTIVE     Vitals:  Temp:  [36.5 ??C (97.7 ??F)-37.3 ??C (99.2 ??F)] 36.7 ??C (98 ??F)  Heart Rate:  [76-118] 114  SpO2 Pulse:  [75-111] 111  Resp:  [11-28] 11  BP: (95-140)/(46-91) 140/63  MAP (mmHg):  [59-98] 83  SpO2:  [98 %-100 %] 98 %  Vitals:    10/29/22 0045 10/29/22 0400 10/29/22 0457 10/29/22 0800   BP:  140/63     Pulse:  114     Resp:  11     Temp: 36.7 ??C (98.1 ??F) 36.6 ??C (97.9 ??F) 36.5 ??C (97.7 ??F) 36.7 ??C (98 ??F)   TempSrc: Axillary Axillary Axillary Oral   SpO2:  98%     Weight:       Height:           Patient Lines/Drains/Airways Status       Active  Peripheral & Central Intravenous Access       Name Placement date Placement time Site Days    CVC Triple Lumen 10/22/22 Non-tunneled Right Internal jugular 10/22/22  1521  Internal jugular  6                  Patient Lines/Drains/Airways Status       Active Wounds       None                    Physical Exam:    GEN: Lying in bed in NAD.  HEENT: Normocephalic. Moist mucous membranes. NGT in place. Clamped   RESP: NWOB on RA.  CV: Warm and well-perfused.  GI: non distended   GU: deferred  SKIN: without lesions, bruises, ecchymosis, rashes on clothed examination  MSK: TTP to bilateral knees (R>L). Full ROM of BLE but only able to tolerate small amounts due to pain  NEURO:  Mental Status: A&Ox4, attention intact, speech fluid and coherent, follows commands well and answers questions appropriately  Sensory: Sensory is intact to light touch with exception of left ulnar distribution involving forearm & 4/5th digits  Motor:      Delt Bi Tri WE HG  RUE 5 5 5 5 4     LUE 5 5 5 5 4                  HF   KE       DF       PF        RLE     1 (limited by pain) 3 4+ 4+              LLE   1 (limited by pain) 3 4+ 4+      Abbreviations:   Delt= Deltoid, Bi= Biceps, Tri= Triceps, WF= Wrist flexor, WE= Wrist extensor, HG= Hand Grip  HF= Hip flexor, KE= Knee Extensor, DF= Dorsiflexor, PF= Plantar Flexor, EHL= Extensor Hallicus Longus    Tone: Improved but increased tone most prominent to bilateral lower extremities with hip flexion, knee flexor & knee extension, tone is variable and appears to be related to patient contraction from pain  Reflexes: no clonus    Labs and Diagnostic Studies: Reviewed     Radiology Results: Reviewed   Reviewed MRI CTL    Echocardiogram Transesophageal Echo    Result Date: 10/28/2022  Patient Info Name:     [name] [name] Age:     21 years DOB:     2000-04-28 Gender:     Female MRN:     [id number] Accession #:     [id number] UN Account #:     [id number] Exam Date:     10/28/2022 4:09 PM Admit Date:     09/25/2022     Exam Type:     ECHOCARDIOGRAM TRANSESOPHAGEAL ECHO     Technical Quality:     Fair     Staff Sonographer:     Lavonda Jumbo Referring Physician:     Worthy Flank Reading Fellow:     Virgel Bouquet Ordering Physician:     Suanne Marker     Study Info Indications      - TV/RA vegetation Procedure(s)   Complete two-dimensional, color flow and Doppler transesophageal study is performed. Three dimensional echocardiographic imaging is performed during the transesophageal echocardiogram.         Summary   1. TEE to evaluate for endocarditis.  2. Prominent eustachian valve and chiari network visualized within right atrium, infectious etiology is felt to be less likely.   3. No other apparent valvular vegetations.   4. Catheter tip noted in SVC without associated vegetation.         Anesthesia/Analgesia   The patient received general anesthesia. Sedation was performed by anesthesiologist.     Procedure Details   The procedure was explained and the consent form was signed. The patient arrived in a fasting state. The patient tolerated the procedure well and there were no complications. There was no difficulty inserting the probe. Number of insertion attempts: 1. I was present and supervised the entire procedure, including insertion of the transesophageal probe.         Right Atrium   Catheter tip noted in SVC without associated vegetation. A prominent eustachian valve is noted.         Aortic Valve   The aortic valve is trileaflet with normal appearing leaflets with normal excursion. There is no significant aortic regurgitation.     Pulmonic Valve   The pulmonic valve is poorly visualized, but probably normal. There is no significant pulmonic regurgitation.     Mitral Valve   The mitral valve leaflets are normal with normal leaflet mobility. There is trivial mitral valve regurgitation.     Tricuspid Valve   The tricuspid valve leaflets are normal, with normal leaflet mobility. There is trivial tricuspid regurgitation.         Pericardium/Pleural   There is no pericardial effusion.     Other Findings   Rhythm: Sinus Rhythm.         Report Signatures Finalized by Ofilia Neas  MD on 10/28/2022 08:24 PM Resident Virgel Bouquet on 10/28/2022 05:27 PM    MRI Brain W Wo Contrast    Result Date: 10/24/2022  EXAM: Magnetic resonance imaging, brain, without and with contrast material. ACCESSION: [id number] UN     CLINICAL INDICATION: 22 years old Female with Increasing rigidity/tone and weakness, rule out central process      COMPARISON: MRI brain 09/17/2016     TECHNIQUE: Multiplanar, multisequence MR imaging of the brain was performed without and with I.V. contrast.     FINDINGS:  There is no focal parenchymal signal abnormality. Ventricles are normal in size. There is no midline shift. No extra-axial fluid collection. No evidence of intracranial hemorrhage. No diffusion weighted signal abnormality to suggest acute infarct. Developmental venous anomaly in the right frontal centrum semiovale.     No mass. There is no abnormal intracranial enhancement. No abnormal enhancement in the left parotid gland on today's exam.         --Normal MRI of the brain.         Echocardiogram Follow Up/Limited Echo    Result Date: 10/24/2022  Patient Info Name:     [name] [name] Age:     21 years DOB:     March 28, 2000 Gender:     Female MRN:     [id number] Accession #:     [id number] UN Account #:     [id number] Ht:     165 cm Wt:     60 kg BSA:     1.66 m2 BP:     129 /     62 mmHg Exam Date:     10/23/2022 3:47 PM Admit Date:     09/25/2022     Exam Type:     ECHOCARDIOGRAM FOLLOW UP/LIMITED ECHO     Technical  Quality:     Poor     Reason for Poor Study:     poor echocardiographic windows     Staff Sonographer:     Ezequiel Kayser Referring Physician:     Miles Costain Ossman     Study Info Indications      - Fungemia Procedure(s)   Limited 2D, color flow and Doppler transthoracic echocardiogram is performed.         Summary   1. Technically difficult study.   2. The left ventricular systolic function is normal, LVEF is visually estimated at 55-60%.   3. Possible mobile echodensity noted on the mitral valve; concern for possible vegetation.   4. The right ventricle is normal in size, with normal systolic function.   5. There is a mobile echodensity in the right atrium. Cannot rule out vegetation on tricuspid valve vs indwelling catheter.     Recommendations   * Recommend follow-up transesophageal echocardiogram if clinical concern for endocarditis.         Left Ventricle   The left ventricle is normal in size with normal wall thickness. The left ventricular systolic function is normal, LVEF is visually estimated at 55-60%.     Right Ventricle   The right ventricle is normal in size, with normal systolic function.         Right Atrium   There is a mobile echodensity in the right atrium. Cannot rule out vegetation on tricuspid valve vs indwelling catheter.         Aortic Valve   The aortic valve is trileaflet with normal appearing leaflets with normal excursion. There is no significant aortic regurgitation.     Mitral Valve   The mitral valve leaflets are normal with normal leaflet mobility. Possible mobile echodensity noted on the mitral valve; concern for possible vegetation. There is no significant mitral valve regurgitation.     Tricuspid Valve   The tricuspid valve leaflets are normal, with normal leaflet mobility. There is trivial tricuspid regurgitation. The pulmonary systolic pressure cannot be estimated due to insufficient TR signal. There is insufficient TR signal pulmonary hypertension. TR maximum velocity: 1.5 m/s.     Pulmonic Valve   Pulmonary valve is not well visualized. There is trivial pulmonic regurgitation. There is no evidence of a significant transvalvular gradient.         Pericardium/Pleural   There is no pericardial effusion.     Other Findings   Rhythm: Sinus Rhythm.         Ventricles ---------------------------------------------------------------------- Name                                 Value        Normal ----------------------------------------------------------------------     LV Dimensions 2D/MM ----------------------------------------------------------------------  IVS Diastolic Thickness (2D)                                0.8 cm       0.6-0.9 LVID Diastole (2D)                  4.1 cm       3.8-5.2  LVPW Diastolic Thickness (2D)                                0.8 cm  0.6-0.9 LVID Systole (2D)                   3.2 cm       2.2-3.5 LV Mass Index (2D Cubed)           58 g/m2         43-95  Relative Wall Thickness (2D)                                  0.39     Tricuspid Valve ---------------------------------------------------------------------- Name                                 Value        Normal ----------------------------------------------------------------------     TV Regurgitation Doppler ---------------------------------------------------------------------- TR Peak Velocity                   1.5 m/s         Report Signatures Finalized by Rudene Anda  MD on 10/24/2022 11:23 AM Preliminary amended by Suanne Marker on 10/24/2022 08:23 AM Resident Eustace Moore Essig on 10/23/2022 05:04 PM    US Liver Doppler    Result Date: 10/23/2022  EXAM: US LIVER DOPPLER ACCESSION: [id number] UN     CLINICAL INDICATION: 21 years old with Abnormal LFTs, GNR/yeast growing on blood cultures, better characterize small lesions noted on CT      COMPARISON: CT abdomen/pelvis 10/21/2022     TECHNIQUE: Ultrasound views of the liver vasculature were obtained using grayscale, color Doppler, and spectral Doppler analysis     FINDINGS:     Evaluation limited by lack of patient mobility and patient discomfort during examination.     PORTAL VEIN: The main, left and right portal veins were patent with hepatopetal flow. Normal main portal vein velocity (0.20 m/s or greater)      Main portal vein diameter:   1.0  cm          Main portal vein velocity: 0.34 m/s      Anterior right portal vein velocity:   0.46  m/s      Posterior right portal vein velocity:  0.23   m/s      Left portal vein velocity:   0.14  m/s          Main portal vein flow: hepatopetal      Right portal vein flow: hepatopetal      Left portal vein flow: hepatopetal     SPLENIC VEIN: Patent, with hepatopetal flow.      Splenic vein midline: hepatopetal      Splenic vein proximal: hepatopetal     HEPATIC VEINS/IVC: The IVC, left, middle and right hepatic veins were were patent.      Left hepatic vein phasicity/flow: triphasic      Middle hepatic vein phasicity/flow: bi-tri      Right hepatic vein phasicity/flow: bi-tri      Inferior vena cava phasicity/flow: mono-bi     HEPATIC ARTERY: Limited evaluation of the common hepatic artery. It appears patent on Doppler imaging, although with turbulent flow, but this is likely artifactual.     OTHER: No ascites. Partially imaged layering echogenic debris in the gallbladder, corresponding to biliary sludge demonstrated on CT abdomen/pelvis 10/21/2022.             Patent hepatic vasculature with normal flow although  with sluggish velocities in the right posterior and left portal veins.                             IR Remove Tunneled Catheter    Result Date: 10/23/2022  PROCEDURE: Tunneled central venous catheter removal ACCESSION: [id number] UN     Procedural Personnel Attending physician(s): Dr. Claretta Fraise Resident physician(s): Dr. Leone Payor Advanced practice provider(s): None     Pre-procedure diagnosis: Bacteremia Post-procedure diagnosis: Same Indication: Bacteremia or sepsis of unknown source Additional clinical history: None _______________________________________________________________     PROCEDURE SUMMARY: - Tunneled central venous catheter removal - Additional procedure(s): None     PROCEDURE DETAILS:     Pre-procedure Consent: Informed consent for the procedure including risks, benefits and alternatives was obtained and time-out was performed prior to the procedure. Preparation: The site was prepared and draped using maximal sterile barrier technique including cutaneous antisepsis.     Anesthesia/sedation Level of anesthesia/sedation: No sedation Anesthesia/sedation administered by: Not applicable     Catheter removal Local anesthesia was administered. The catheter was removed with a combination of traction and blunt dissection. Fluoroscopic images were not obtained to document removal.     Closure Hemostasis was achieved with manual compression. Sterile dressing(s) applied.     Contrast Contrast agent: Omnipaque 300 Contrast volume (mL): 0     Radiation Dose Fluoroscopy time (minutes): 0  Reference Air Kerma (mGy): 0     Additional Details Additional description of procedure: None Registry event: Z/3/f Device used: None Equipment details: None Specimens removed: Tunneled central venous catheter. Estimated blood loss (mL): Less than 10 Standardized report: [email or web address]_v3.1     Attestation Signer name: Claretta Fraise I attest that I supervised the procedure and was immediately available. I reviewed the stored images and agree with the report as written. _______________________________________________________________     Complications: No immediate complications.             Removal of right-sided tunneled powerline catheter in the internal jugular vein.     Plan:     Please re-consult interventional radiology if new catheter placement is desired.         XR Chest Portable    Result Date: 10/22/2022  EXAM: XR CHEST PORTABLE ACCESSION: [id number] UN     CLINICAL INDICATION: CATHETER VASCULAR FIT&ADJ      TECHNIQUE: Single View AP Chest Radiograph.     COMPARISON: 10/13/2022     FINDINGS:     Right internal jugular venous catheter with tip terminating at the superior cavoatrial junction. Enteric tube enters the stomach.     Lungs are clear.  No pleural effusion or pneumothorax.     Normal heart size and mediastinal contours.             New right internal jugular venous catheter with tip terminating at the superior cavoatrial junction

## 2022-10-29 NOTE — Unmapped (Signed)
Problem: Adult Inpatient Plan of Care  Goal: Plan of Care Review  Outcome: Ongoing - Unchanged  Goal: Patient-Specific Goal (Individualized)  Outcome: Ongoing - Unchanged  Goal: Absence of Hospital-Acquired Illness or Injury  Outcome: Ongoing - Unchanged  Intervention: Identify and Manage Fall Risk  Recent Flowsheet Documentation  Taken 10/28/2022 1400 by Natalia Leatherwood, RN  Safety Interventions: aspiration precautions  Taken 10/28/2022 1200 by Natalia Leatherwood, RN  Safety Interventions: aspiration precautions  Taken 10/28/2022 1000 by Natalia Leatherwood, RN  Safety Interventions: aspiration precautions  Taken 10/28/2022 0800 by Natalia Leatherwood, RN  Safety Interventions: aspiration precautions  Intervention: Prevent Skin Injury  Recent Flowsheet Documentation  Taken 10/28/2022 1530 by Natalia Leatherwood, RN  Device Skin Pressure Protection: absorbent pad utilized/changed  Skin Protection: adhesive use limited  Taken 10/28/2022 1400 by Natalia Leatherwood, RN  Positioning for Skin: Other (Comment)  Device Skin Pressure Protection: absorbent pad utilized/changed  Skin Protection: adhesive use limited  Taken 10/28/2022 1200 by Natalia Leatherwood, RN  Positioning for Skin: Other (Comment)  Device Skin Pressure Protection: absorbent pad utilized/changed  Skin Protection: adhesive use limited  Taken 10/28/2022 1000 by Natalia Leatherwood, RN  Positioning for Skin: Other (Comment)  Device Skin Pressure Protection: absorbent pad utilized/changed  Skin Protection: adhesive use limited  Taken 10/28/2022 0800 by Natalia Leatherwood, RN  Positioning for Skin: Other (Comment)  Device Skin Pressure Protection: absorbent pad utilized/changed  Skin Protection: adhesive use limited  Taken 10/28/2022 0715 by Natalia Leatherwood, RN  Device Skin Pressure Protection: absorbent pad utilized/changed  Skin Protection: adhesive use limited  Intervention: Prevent and Manage VTE (Venous Thromboembolism) Risk  Recent Flowsheet Documentation  Taken 10/28/2022 1530 by Natalia Leatherwood, RN  VTE Prevention/Management: anticoagulant therapy  Anti-Embolism Intervention: Refused  Taken 10/28/2022 1400 by Natalia Leatherwood, RN  Anti-Embolism Intervention: Refused  Taken 10/28/2022 1200 by Natalia Leatherwood, RN  VTE Prevention/Management: anticoagulant therapy  Anti-Embolism Intervention: Refused  Taken 10/28/2022 1000 by Natalia Leatherwood, RN  Anti-Embolism Intervention: Refused  Taken 10/28/2022 0800 by Natalia Leatherwood, RN  Anti-Embolism Intervention: Refused  Taken 10/28/2022 0715 by Natalia Leatherwood, RN  VTE Prevention/Management: anticoagulant therapy  Anti-Embolism Intervention: Refused  Intervention: Prevent Infection  Recent Flowsheet Documentation  Taken 10/28/2022 1400 by Natalia Leatherwood, RN  Infection Prevention: environmental surveillance performed  Taken 10/28/2022 1200 by Natalia Leatherwood, RN  Infection Prevention: environmental surveillance performed  Taken 10/28/2022 1000 by Natalia Leatherwood, RN  Infection Prevention: environmental surveillance performed  Taken 10/28/2022 0800 by Natalia Leatherwood, RN  Infection Prevention: environmental surveillance performed  Goal: Optimal Comfort and Wellbeing  Outcome: Ongoing - Unchanged  Goal: Readiness for Transition of Care  Outcome: Ongoing - Unchanged  Goal: Rounds/Family Conference  Outcome: Ongoing - Unchanged

## 2022-10-29 NOTE — Unmapped (Signed)
Division of Infectious Diseases  General Inpatient Consultation Service     For questions about this consult, page [phone] (Gen A Follow-up Pager).    [name] [name] is being seen in consultation at the request of [name], MD for evaluation and management of candida fungemia and GNR bacteremia.     PLAN FOR 10/29/2022    Diagnostic  Continue to monitor LFTs, if worsening please obtain a liver ultrasound.  Follow up blood cultures sent 8/17 to completion  Monitor for antimicrobial toxicity with the following:  CBC w/diff at least once per week  clinical assessments for rashes or other skin changes  CMP daily for now    Treatment  Continue on cefazolin based on GNR susceptibilities  Continue on micafungin 100mg  daily, awaiting susceptibility of C albicans to determine if we can switch to fluconazole  Duration of therapy = anticipate 2 weeks from first negative blood culture  start date = 10/25/22  end date = TBD    I discussed the plans for today with family/caregiver(s) and patient on 10/29/2022.    Our service will continue to follow.    I personally spent 30 mins face-to-face and non-face-to-face in the care of this patient on 10/29/2022, which includes all pre, intra, and post visit time on the date of service.  All documented time was specific to the E/M visit and does not include any procedures that may have been performed.    [name] Sauger, MD  [facility] Division of Infectious Diseases                MDM and Problem-Specific Assessments  ( .00ID2DAY  /  .29FAOZHYQMVH  /  .IDSS  / .[name] )     22 y.o. 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 with ileus, inability to tolerate PO, and diffuse abdominal pain. Subsequently found to have Candidemia and Enterobacteriaceae (species undetermined) bacteremia, likely due to TPN/CVC, now s/p RIJ removal on 8/14. TTE with possible vegetations in right atrium, but no vegetations seen on TEE (noted prominent eustachian valve and chiari network).     October 29, 2022   Patient has: []  acute illness w/systemic sxs  [mod] [x]  illness posing risk to life or function  [high]   I reviewed:   (3+) [x]  primary team note [x]  consultant note(s) []  procedure/op note(s) [x]  micro result(s)    [x]  CBC results [x]  chemistry results [x]  radiology report(s) []  nursing note(s)   I independently visualized:   (any)   []  cxs/plates in lab []  plain film images []  CT images []  PET images    []  path slide(s) []  ECG tracing []  MRI images []  nuclear scan   I discussed: (any) [x]  micro and/or path w/lab personnel []  drug options and/or interactions w/ID pharmD    []  procedure/OR findings w/other MD(s) []  echo and/or imaging w/other MD(s)    []  mgm't w/attending(s) involved in case []  setting up home abx w/OPAT team   Mgm't requires: []  prescription drug(s)  [mod] [x]  intensive toxicity monitoring  [high]     Interval notes & result reports show: Afebrile. VSS on 2L [place]. No acute events overnight. TEE with no vegetations.    Patient reports: Neck pain from attempts at line placement and chronic abdominal pain. Denies fever/chills.    My interpretation of the data I reviewed is: WBC 8.3, WNL. AST/ALT 22/120 remain elevated. 8/17 Bcx remain  NG. TEE revealed prominent eustachian valve and chiari network, no other apparent valvular vegetations noted.     # GNR bacteremia  - acute, poses threat to life or bodily function  [high]  #Candida albicans fungemia - acute, poses threat to life or bodily function  [high]  Patient admitted since 7/18 with course c/b worsening RLQ abdominal pain in setting of new recurrent fevers and tachycardia. Of note, R IJ for TPN had been present since 01/2022 (since removed/replaced). Blood cultures on 8/13 grew 1/4 GNR, lactose fermenter, family Enterobacteriaceae, blood culture also growing yeast (Candida albicans).  TTE (8/15) demonstrated mobile echodensity in right atrium, cannot rule out vegetation, but TEE  on 8/19 revealed prominent eustachian valve and chiari network, no other apparent valvular vegetations noted. With fluctuating LFTs, unclear etiology, for now continuing cefazolin and micafungin and monitoring closely.  Management is as per the blue box above.    # Background of Gardner's syndrome, desmoid tumors-  chronic, exacerbation / progression / sfx of tx  [mod]  Diagnosed around the age of 1.    # Circumferential bladder wall thickening on CT from 10/22/2022 - acute, stable  [low]  Ua from 10/22/2022 positive.  Management is as per the blue box above.    # Muscle rigidity, increased tone and spasms   - acute, systemic symptoms  [mod]  09/25/2022 had injection of desmoid tumors with steroid/anesthetic under US guidance. For desmoid tumors in right forearm and left chest wall. Had multiple injections like these in the past without complications.  Admitted on 7.19 for observations , her workup included MRI spine and CK which were unremarkable.  Management per primary team.    # Management of prescription antimicrobials needing intensive toxicity monitoring - acute, poses threat to life or bodily function  [high]  Beta-lactams (penicillins, cephalosporins, and carbapenems) can cause rashes, loose stools, nephrotoxicity, and/or myelosuppression.  Echinocandin antigfungals can cause phlebitis, skin rashes, N/V/D, hepatotoxicity, neutropenia, thrombocytopenia, and/or infusion-related flushing, pruritis, or fever.   See recommendations in blue box above.    # Disposition  TBD            Antimicrobials & Other Medications  ( .00IDGANTT  /  .00IDGANTTLIST  )     Current  Micafungin 8/14-  Cefazolin 8/20-    Previous  Metronidazole 8/13-8/16  Vancomycin 8/13-8/15  Cefepime 8/13-8/20    Immunomodulators and antipyretics  None       Current Medications as of 10/29/2022  Scheduled  PRN   baclofen, 20 mg, TID  bisacodyl, 10 mg, BID  buprenorphine-naloxone, 0.25 Film, TID  ceFAZolin, 2 g, Q8H  cetirizine, 10 mg, Daily  diclofenac sodium, 2 g, QID  enoxaparin (LOVENOX) injection, 40 mg, Q24H SCH  famotidine, 20 mg, Nightly  micafungin, 100 mg, Q24H  mirtazapine, 15 mg, Nightly  naloxegol, 25 mg, Daily  [Provider Hold] nirogacestat, 50 mg, BID  pantoprazole, 40 mg, BID  polyethylene glycol, 17 g, BID  senna, 2 tablet, Nightly  sodium chloride 0.9%, 500 mL, Once      acetaminophen, 650 mg, Q6H PRN  aluminum-magnesium hydroxide-simethicone, 60 mL, Q6H PRN  calcium carbonate, 400 mg elem calcium, Daily PRN  Chemo Clarification Order, , Continuous PRN  guaiFENesin, 200 mg, Q4H PRN  hydrocortisone, , BID PRN  HYDROmorphone, 1 mg, Q3H PRN  IP okay to treat, , Continuous PRN  lidocaine, , TID PRN  LORazepam, 1 mg, BID PRN  LORazepam, 0.5 mg, TID PRN  lidocaine-diphenhydrAMINE-aluminum-magnesium, 10 mL, QID PRN  ondansetron, 4  mg, Q12H PRN  oxyCODONE, 10 mg, Q4H PRN  phenol, 2 spray, Q2H PRN  polyethylene glycol, 17 g, Daily PRN  promethazine, 6.5 mg, Q6H PRN  pseudoephedrine, 30 mg, Daily PRN  zinc oxide-cod liver oil, , Daily PRN           Physical Exam     Temp:  [36.5 ??C (97.7 ??F)-37.3 ??C (99.2 ??F)] 36.7 ??C (98 ??F)  Heart Rate:  [76-118] 114  SpO2 Pulse:  [75-111] 111  Resp:  [11-28] 11  BP: (95-140)/(46-91) 140/63  MAP (mmHg):  [59-98] 83  SpO2:  [98 %-100 %] 98 %    Actual body weight: 54 kg (119 lb)  Ideal body weight: 57 kg (125 lb 10.6 oz)      Const [x]  vital signs above      []  WDWN, NAD, non-toxic appearance  [x]  Uncomfortable appearing, NAD      Eyes   []  Lids normal bilaterally, conjunctiva anicteric and noninjected OU  []  PERRL   []        ENMT     [x]  Normal appearance of external nose and ears       [x]  No visible thrush  []  MMM, no lesions on lips or gums, dentition good        []  Hearing normal   [x]  NG tube in place.      Neck    []  Neck of normal appearance and trachea midline        []  No thyromegaly, nodules, or tenderness   [x]  RIJ central line in place      Lymph    []  No LAD in neck       []  No LAD in supraclavicular area       []  No LAD in axillae   []  No LAD in epitrochlear chains       []  No LAD in inguinal areas  []        CV    [x]  RRR, no m/r/g, S1/S2       []  No peripheral edema, WWP       []  Pedal pulses intact   []        Resp   [x]  Normal WOB       []  CTAB   []        GI   []  Normal inspection, NTND, NABS       []  No umbilical hernia on exam       []  No hepatosplenomegaly       []  Inspection of perineal and perianal areas normal  []        GU   []  Normal external genitalia       []        MSK   []  No clubbing or cyanosis of hands       []  No focal tenderness or abnormalities on palpation of joints in RUE, LUE, RLE, or LLE  []        Skin   []  No rashes, lesions, or ulcers of visualized skin       [x]  Skin warm and dry to palpation   []        Neuro   [x]  CNs II-XII grossly intact       []  Sensation to light touch grossly intact throughout   []  DTRs normal and symmetric throughout   []  Unable to assess due to critical illness, sedation, or mental status  []        Psych   []  Appropriate affect      []   Oriented to person, place, time      []  Judgment and insight are appropriate   []  Unable to assess due to critical illness, sedation, or mental status  []           Patient Lines/Drains/Airways Status       Active Active Lines, Drains, & Airways       Name Placement date Placement time Site Days    CVC Triple Lumen 10/22/22 Non-tunneled Right Internal jugular 10/22/22  1521  Internal jugular  6    Feeding Tube Other (Comment) 14 Fr. Left nare 10/28/22  1730  Left nare  less than 1                      Data for ID Decision Making  ( IDGENCONMDM )       Micro & Serological Data   ( RSLTMICRO  /  [id number]  /  00CXSRC  /  00CXRES  /  00CXSUSC )    Microbiology Results (last day)       Procedure Component Value Date/Time Date/Time    Blood Culture #1 [[id number]]  (Normal) Collected: 10/25/22 0609    Lab Status: Preliminary result Specimen: Blood from 1 Peripheral Draw Updated: 10/29/22 0630     Blood Culture, Routine No Growth at 4 days    Blood Culture #2 [[id number]]  (Normal) Collected: 10/25/22 0610    Lab Status: Preliminary result Specimen: Blood from 1 Peripheral Draw Updated: 10/29/22 0630     Blood Culture, Routine No Growth at 4 days              7/19 Bcx NGTD  8/13 Bcx 1/2 GNR, family enterobacteriaceae (R-ampi; S-cefazolin, cipro, gent, levo, pip+tazo), C.albicans  8/13 Bcx 2/2 NGTD  8/15 Ux <10,000 CFU/mL Candida albicans  8/17 Bcx 2/2 NGTD    Recent Studies  ( RISRSLT )    Echocardiogram Transesophageal Echo  Result Date: 10/28/2022  Summary   1. TEE to evaluate for endocarditis.   2. Prominent eustachian valve and chiari network visualized within right atrium, infectious etiology is felt to be less likely.   3. No other apparent valvular vegetations.   4. Catheter tip noted in SVC without associated vegetation.       Initial Consult Documentation from October 22, 2022     Sources of information include: chart review, patient, and family/friend(s).    History of Present Illness:     22 y.o. 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    Patient has been admitted to [facility] since 7/18 with chronic abdominal pain and restarted on TPN in the setting of low PO intake. Patient was noted with chronic abdominal pain and nausea that is secondary to desmoid tumors and prior surgeries. More recently on 8/11, patient was noted with peristalsis in right lower quadrant. She was last noted with bowel movement on 8/11. Since, she has had worsening abdominal pain. Overnight of 8/12, patient was febrile to 38.7 despite tylenol and tachycardic to 140s. Since, patient has been having recurrent fevers and tachycardic with no leukocytosis. CTAP (8/13) was unremarkable for acute processes and demonstrated no small bowel obstruction. Blood cultures on 8/13 grew 1/2 GNR. Patient was started on cefepime, vancomycin, and metronidazole on 8/13. Right IJ was removed on 8/14.    Of  note, pouchoscopy on 02/2022 showed 1cm of circumferential ulceration with severe, active pouchitis which improved ~2 months later. Repeat pouchoscopy on 07/16/22 demonstrated small ulcerations on suture line and a rectal cuff beginning 1cm from anal verge and extending 2cm with healthy appearing mucosa. Patient has been on TPN since 09/28/22 through nontunneled right IJ that has been present since 01/2022.    Past Medical History   Patient  has a past medical history of Abdominal pain, Acid reflux, Anesthesia complication, Cancer (CMS-HCC), Cataract of right eye, COVID-19 virus infection (01/2019), Cyst of thyroid determined by ultrasound, [name], Difficult intravenous access, [name] (familial adenomatous polyposis), [name], Gastric polyps, History of chemotherapy, History of colon polyps, History of COVID-19 (01/2019), Iron deficiency anemia due to chronic blood loss, PONV (postoperative nausea and vomiting), Rectal bleeding, and Syncopal episodes.      Meds and Allergies  Patient has a current medication list which includes the following prescription(s): lorazepam, promethazine, acetaminophen, baclofen, cefdinir, cholecalciferol (vitamin d3 25 mcg (1,000 units)), clobetasol, diclofenac sodium, doxycycline, famotidine, hydrocortisone, lidocaine, lorazepam, mirtazapine, naloxone, nirogacestat, oxycodone, pimecrolimus, triamcinolone, and zinc oxide, and the following Facility-Administered Medications: acetaminophen, aluminum-magnesium hydroxide-simethicone, baclofen, bisacodyl, buprenorphine-naloxone, calcium carbonate, cefazolin in 50ml dextrose, cetirizine, CHEMO CLARIFICATION ORDER, diclofenac sodium, enoxaparin, famotidine, guaifenesin, hydrocortisone, hydromorphone (pf), IP OKAY TO TREAT, lidocaine, lorazepam, lorazepam, micafungin (MYCAMINE) 100 mg in sodium chloride 0.9 % (NS) 100 mL IVPB-MBP, mirtazapine, lidocaine-diphenhydramine-aluminum-magnesium, naloxegol, [Provider Hold] nirogacestat, ondansetron, oxycodone, pantoprazole, phenol, polyethylene glycol, polyethylene glycol, promethazine (PHENERGAN) 6.5 mg in sodium chloride (NS) 0.9 % 50 mL IVPB, pseudoephedrine, senna, sodium chloride, sodium chloride 0.9%, and zinc oxide-cod liver oil.    Allergies: Adhesive tape-silicones; Ferrlecit [sodium ferric gluconat-sucrose]; Levofloxacin; Methylnaltrexone; Neomycin; Papaya; Morphine; Zosyn [piperacillin-tazobactam]; Compazine [prochlorperazine]; Iron analogues; Iron dextran; and Latex, natural rubber       Social History  Patient  reports that she has never smoked. She has been exposed to tobacco smoke. She has never used smokeless tobacco. She reports that she does not drink alcohol and does not use drugs.       Scribe's Attest:  [name] Sauger, MD obtained and performed the history, physical exam and medical decision making elements that were entered into the chart. Documentation assistance was provided by me personally. Signed by Paulina Fusi, Scribe, on October 29, 2022 at 10:03 AM.     Provider???s Attestation:   Documentation assistance provided by the Scribe, Paulina Fusi. I was present during the time the encounter was [name] Sauger, MD. October 29, 2022 at 10:03 AM

## 2022-10-29 NOTE — Unmapped (Signed)
Brief Inpatient Consult Note         Recommendations          [name] [name] is a 22 y.o. female history of dermoid tumors status post numerous resections including paraspinal was admitted for postop pain and increased tone.  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    Increased Tone - Generalized Weakness  Initially she was noted to have worsening BLE stiffness since procedure on 7/18. Acute onset of muscle rigidity isolated to lower extremities associated with anesthesia. Differential includes prolonged anesthesia reaction, nutrition abnormalities, stiff person syndrome or other autoimmune myelopathy. 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 Due to concern for central process given increased tone, repeat MRI brain w wo was obtained which was normal. Nutritional work up completed to for generalized dystonia as patient has had extended periods of TPN and thus far has only been remarkable with mildly low zinc otherwise remainder has been unremarkable. Given unremarkable work up thus far recommended obtaining AI/myelopathy panel to evaluate for stiff person syndrome however atypical to present with acute onset.  Had increased titer for calcium channel blocker antibody found on a paraneoplastic panel.  This is slightly increased and her symptoms do not necessarily match for Lambert-Eaton syndrome. Either way, can obtain EMG and further wait for the repeat panel.    Thank you for the consult. We will sign off at this time. Please page [phone] for any other concerns.  RECOMMENDATIONS:   Can consider EMG  Follow repeat autoimmune/paraneoplastic panel    This patient was discussed with Dr. Dan Humphreys, who agrees to the assessment and plan.     Marquis Buggy, MD  PGY-2, Neurology

## 2022-10-29 NOTE — Unmapped (Signed)
Assessment: [name] [name] is a 22 y.o. female who was admitted for Other acute postprocedural pain       Shift Events: Patient A&Ox4, NSR at rest, ST (130s-160s with minimal exertion), BP 125-140/60-90, afebrile, and on 2L . Frequent complaints of severe pain requiring PRN dilaudid and oxycodone. Additional complaints of nausea and anxiety. PRN zofran, phenergan, and ativan given. Adequate UO. No BM. NPO except for ice chips. With standing to the BSC, patient's HR jumped to 130-160s and she complained of dizziness. Corpak verified by x-ray, but began leaking from one port. Tube feeds increased from 10 to 30 mL/hr. Mother at bedside. Patient remained free from falls, injury, and rounds and turns completed.        Current Hospital Length of Stay: 32 Days    INFUSIONS:   Chemo Clarification Order      IP okay to treat      sodium chloride         VITALS:  Vitals:    10/11/22 0530 10/26/22 1730   Weight: 60.2 kg (132 lb 11.5 oz) 54 kg (119 lb)      Weight change:      Temp:  [36.5 ??C (97.7 ??F)-37.3 ??C (99.2 ??F)] 36.5 ??C (97.7 ??F)  Heart Rate:  [68-118] 114  SpO2 Pulse:  [66-111] 111  Resp:  [11-28] 11  BP: (95-140)/(46-91) 140/63  MAP (mmHg):  [59-98] 83  SpO2:  [98 %-100 %] 98 %     VENT/OXYGEN SETTINGS:       INS & OUTS:  Date 10/28/22 0701 - 10/29/22 0700 10/29/22 0701 - 10/30/22 0700   Shift 0701-1900 1901-0700 24 Hour Total 0701-1900 1901-0700 24 Hour Total   INTAKE   P.O. 0 0 0      I.V. 500  500      NG/GT  110 110      IV Piggyback  1156 1156      Shift Total 500 1266 1766      OUTPUT   Urine 1250 1050 2300      Emesis/NG output  0 0      Blood 1  1      Shift Total 1251 1050 2301        Intake/Output Summary (Last 24 hours) at 10/29/2022 6283  Last data filed at 10/29/2022 1517  Gross per 24 hour   Intake 1766 ml   Output 2301 ml   Net -535 ml        ACCESS:  Patient Lines/Drains/Airways Status       Active Active Lines, Drains, & Airways       Name Placement date Placement time Site Days    CVC Triple Lumen 10/22/22 Non-tunneled Right Internal jugular 10/22/22  1521  Internal jugular  6    Feeding Tube Other (Comment) 14 Fr. Left nare 10/28/22  1730  Left nare  less than 1                           See flowsheets/MAR for further information      Virgia Land, RN  October 29, 2022 6:25 AM        Problem: Adult Inpatient Plan of Care  Goal: Plan of Care Review  Outcome: Progressing  Goal: Patient-Specific Goal (Individualized)  Outcome: Progressing  Goal: Absence of Hospital-Acquired Illness or Injury  Outcome: Progressing  Intervention: Identify and Manage Fall Risk  Recent Flowsheet Documentation  Taken 10/29/2022 0400 by  Virgia Land, RN  Safety Interventions:   aspiration precautions   commode/urinal/bedpan at bedside   fall reduction program maintained   lighting adjusted for tasks/safety   low bed   nonskid shoes/slippers when out of bed  Taken 10/29/2022 0200 by Virgia Land, RN  Safety Interventions:   aspiration precautions   commode/urinal/bedpan at bedside   fall reduction program maintained   lighting adjusted for tasks/safety   low bed   nonskid shoes/slippers when out of bed  Taken 10/29/2022 0000 by Virgia Land, RN  Safety Interventions:   aspiration precautions   commode/urinal/bedpan at bedside   fall reduction program maintained   lighting adjusted for tasks/safety   low bed   nonskid shoes/slippers when out of bed  Taken 10/28/2022 2200 by Virgia Land, RN  Safety Interventions:   aspiration precautions   commode/urinal/bedpan at bedside   fall reduction program maintained   lighting adjusted for tasks/safety   low bed   nonskid shoes/slippers when out of bed  Taken 10/28/2022 2000 by Virgia Land, RN  Safety Interventions:   aspiration precautions   commode/urinal/bedpan at bedside   fall reduction program maintained   lighting adjusted for tasks/safety   low bed   nonskid shoes/slippers when out of bed  Intervention: Prevent Skin Injury  Recent Flowsheet Documentation  Taken 10/29/2022 0400 by Virgia Land, RN  Positioning for Skin: Right  Device Skin Pressure Protection:   absorbent pad utilized/changed   adhesive use limited   positioning supports utilized   skin-to-device areas padded   skin-to-skin areas padded  Skin Protection:   adhesive use limited   cleansing with dimethicone incontinence wipes   incontinence pads utilized   tubing/devices free from skin contact   transparent dressing maintained   skin-to-skin areas padded   skin-to-device areas padded  Taken 10/29/2022 0200 by Virgia Land, RN  Positioning for Skin: Left  Device Skin Pressure Protection:   absorbent pad utilized/changed   adhesive use limited   positioning supports utilized   skin-to-device areas padded   skin-to-skin areas padded  Skin Protection:   adhesive use limited   cleansing with dimethicone incontinence wipes   incontinence pads utilized   tubing/devices free from skin contact   transparent dressing maintained   skin-to-skin areas padded   skin-to-device areas padded  Taken 10/29/2022 0000 by Virgia Land, RN  Positioning for Skin: Right  Device Skin Pressure Protection:   absorbent pad utilized/changed   adhesive use limited   positioning supports utilized   skin-to-device areas padded   skin-to-skin areas padded  Skin Protection:   adhesive use limited   cleansing with dimethicone incontinence wipes   incontinence pads utilized   tubing/devices free from skin contact   transparent dressing maintained   skin-to-skin areas padded   skin-to-device areas padded  Taken 10/28/2022 2200 by Virgia Land, RN  Positioning for Skin:   Supine/Back   Right  Device Skin Pressure Protection:   absorbent pad utilized/changed   adhesive use limited   positioning supports utilized   skin-to-device areas padded   skin-to-skin areas padded  Skin Protection:   adhesive use limited   cleansing with dimethicone incontinence wipes   incontinence pads utilized   tubing/devices free from skin contact   transparent dressing maintained   skin-to-skin areas padded   skin-to-device areas padded  Taken 10/28/2022 2000 by Virgia Land, RN  Positioning for Skin: Supine/Back  Device Skin Pressure Protection:   absorbent  pad utilized/changed   adhesive use limited   positioning supports utilized   skin-to-device areas padded   skin-to-skin areas padded  Skin Protection:   adhesive use limited   cleansing with dimethicone incontinence wipes   incontinence pads utilized   tubing/devices free from skin contact   transparent dressing maintained   skin-to-skin areas padded   skin-to-device areas padded  Intervention: Prevent Infection  Recent Flowsheet Documentation  Taken 10/29/2022 0400 by Virgia Land, RN  Infection Prevention:   environmental surveillance performed   equipment surfaces disinfected   hand hygiene promoted   personal protective equipment utilized   single patient room provided   rest/sleep promoted  Taken 10/29/2022 0200 by Virgia Land, RN  Infection Prevention:   environmental surveillance performed   equipment surfaces disinfected   hand hygiene promoted   personal protective equipment utilized   single patient room provided   rest/sleep promoted  Taken 10/29/2022 0000 by Virgia Land, RN  Infection Prevention:   environmental surveillance performed   equipment surfaces disinfected   hand hygiene promoted   personal protective equipment utilized   single patient room provided   rest/sleep promoted  Taken 10/28/2022 2200 by Virgia Land, RN  Infection Prevention:   environmental surveillance performed   equipment surfaces disinfected   hand hygiene promoted   personal protective equipment utilized   single patient room provided   rest/sleep promoted  Taken 10/28/2022 2000 by Virgia Land, RN  Infection Prevention:   environmental surveillance performed   equipment surfaces disinfected   hand hygiene promoted   personal protective equipment utilized   single patient room provided   rest/sleep promoted  Goal: Optimal Comfort and Wellbeing  Outcome: Progressing  Goal: Readiness for Transition of Care  Outcome: Progressing  Goal: Rounds/Family Conference  Outcome: Progressing     Problem: Latex Allergy  Goal: Absence of Allergy Symptoms  Outcome: Progressing     Problem: Fall Injury Risk  Goal: Absence of Fall and Fall-Related Injury  Outcome: Progressing  Intervention: Promote Scientist, clinical (histocompatibility and immunogenetics) Documentation  Taken 10/29/2022 0400 by Virgia Land, RN  Safety Interventions:   aspiration precautions   commode/urinal/bedpan at bedside   fall reduction program maintained   lighting adjusted for tasks/safety   low bed   nonskid shoes/slippers when out of bed  Taken 10/29/2022 0200 by Virgia Land, RN  Safety Interventions:   aspiration precautions   commode/urinal/bedpan at bedside   fall reduction program maintained   lighting adjusted for tasks/safety   low bed   nonskid shoes/slippers when out of bed  Taken 10/29/2022 0000 by Virgia Land, RN  Safety Interventions:   aspiration precautions   commode/urinal/bedpan at bedside   fall reduction program maintained   lighting adjusted for tasks/safety   low bed   nonskid shoes/slippers when out of bed  Taken 10/28/2022 2200 by Virgia Land, RN  Safety Interventions:   aspiration precautions   commode/urinal/bedpan at bedside   fall reduction program maintained   lighting adjusted for tasks/safety   low bed   nonskid shoes/slippers when out of bed  Taken 10/28/2022 2000 by Virgia Land, RN  Safety Interventions:   aspiration precautions   commode/urinal/bedpan at bedside   fall reduction program maintained   lighting adjusted for tasks/safety   low bed   nonskid shoes/slippers when out of bed     Problem: Skin Injury Risk Increased  Goal: Skin Health and Integrity  Outcome: Progressing  Intervention: Optimize Skin Protection  Recent Flowsheet Documentation  Taken 10/29/2022 0400 by Virgia Land, RN  Activity Management: up to bedside commode  Pressure Reduction Techniques: frequent weight shift encouraged  Head of Bed (HOB) Positioning: HOB at 30-45 degrees  Pressure Reduction Devices:   pressure-redistributing mattress utilized   positioning supports utilized  Skin Protection:   adhesive use limited   cleansing with dimethicone incontinence wipes   incontinence pads utilized   tubing/devices free from skin contact   transparent dressing maintained   skin-to-skin areas padded   skin-to-device areas padded  Taken 10/29/2022 0200 by Virgia Land, RN  Pressure Reduction Techniques: frequent weight shift encouraged  Head of Bed (HOB) Positioning: HOB at 30-45 degrees  Pressure Reduction Devices:   pressure-redistributing mattress utilized   positioning supports utilized  Skin Protection:   adhesive use limited   cleansing with dimethicone incontinence wipes   incontinence pads utilized   tubing/devices free from skin contact   transparent dressing maintained   skin-to-skin areas padded   skin-to-device areas padded  Taken 10/29/2022 0000 by Virgia Land, RN  Pressure Reduction Techniques:   weight shift assistance provided   frequent weight shift encouraged  Head of Bed (HOB) Positioning: HOB at 30-45 degrees  Pressure Reduction Devices:   pressure-redistributing mattress utilized   positioning supports utilized  Skin Protection:   adhesive use limited   cleansing with dimethicone incontinence wipes   incontinence pads utilized   tubing/devices free from skin contact   transparent dressing maintained   skin-to-skin areas padded   skin-to-device areas padded  Taken 10/28/2022 2200 by Virgia Land, RN  Activity Management: up to bedside commode  Pressure Reduction Techniques: frequent weight shift encouraged  Head of Bed (HOB) Positioning: HOB at 30-45 degrees  Pressure Reduction Devices:   pressure-redistributing mattress utilized   positioning supports utilized  Skin Protection:   adhesive use limited   cleansing with dimethicone incontinence wipes   incontinence pads utilized   tubing/devices free from skin contact   transparent dressing maintained   skin-to-skin areas padded   skin-to-device areas padded  Taken 10/28/2022 1999/07/26 by Virgia Land, RN  Head of Bed [facility]) Positioning: HOB at 30-45 degrees  Taken 10/28/2022 2000 by Virgia Land, RN  Pressure Reduction Techniques: frequent weight shift encouraged  Head of Bed (HOB) Positioning: HOB at 30-45 degrees  Pressure Reduction Devices:   pressure-redistributing mattress utilized   positioning supports utilized  Skin Protection:   adhesive use limited   cleansing with dimethicone incontinence wipes   incontinence pads utilized   tubing/devices free from skin contact   transparent dressing maintained   skin-to-skin areas padded   skin-to-device areas padded     Problem: Self-Care Deficit  Goal: Improved Ability to Complete Activities of Daily Living  Outcome: Progressing     Problem: Comorbidity Management  Goal: Blood Glucose Levels Within Targeted Range  Outcome: Progressing  Goal: Blood Pressure in Desired Range  Outcome: Progressing     Problem: Nausea and Vomiting  Goal: Nausea and Vomiting Relief  Outcome: Progressing  Intervention: Prevent and Manage Nausea and Vomiting  Recent Flowsheet Documentation  Taken 10/28/2022 2200 by Virgia Land, RN  Oral Care:   teeth brushed   tongue brushed     Problem: Infection  Goal: Absence of Infection Signs and Symptoms  Outcome: Progressing  Intervention: Prevent or Manage Infection  Recent Flowsheet Documentation  Taken 10/29/2022 0400 by Virgia Land, RN  Infection Management:  aseptic technique maintained  Taken 10/29/2022 0200 by Virgia Land, RN  Infection Management: aseptic technique maintained  Taken 10/29/2022 0000 by Virgia Land, RN  Infection Management: aseptic technique maintained  Taken 10/28/2022 2200 by Virgia Land, RN  Infection Management: aseptic technique maintained  Taken 10/28/2022 2000 by Virgia Land, RN  Infection Management: aseptic technique maintained     Problem: Pain Acute  Goal: Optimal Pain Control and Function  Outcome: Progressing     Problem: Malnutrition  Goal: Improved Nutritional Intake  Outcome: Progressing

## 2022-10-30 LAB — COMPREHENSIVE METABOLIC PANEL
ALBUMIN: 3.4 g/dL (ref 3.4–5.0)
ALKALINE PHOSPHATASE: 186 U/L — ABNORMAL HIGH (ref 46–116)
ALT (SGPT): 62 U/L — ABNORMAL HIGH (ref 10–49)
ANION GAP: 6 mmol/L (ref 5–14)
AST (SGOT): 14 U/L (ref <=34)
BILIRUBIN TOTAL: 0.2 mg/dL — ABNORMAL LOW (ref 0.3–1.2)
BLOOD UREA NITROGEN: 8 mg/dL — ABNORMAL LOW (ref 9–23)
BUN / CREAT RATIO: 17
CALCIUM: 9.2 mg/dL (ref 8.7–10.4)
CHLORIDE: 107 mmol/L (ref 98–107)
CO2: 30 mmol/L (ref 20.0–31.0)
CREATININE: 0.46 mg/dL — ABNORMAL LOW
EGFR CKD-EPI (2021) FEMALE: >90 mL/min/{1.73_m2} (ref >=60)
GLUCOSE RANDOM: 106 mg/dL (ref 70–179)
POTASSIUM: 3.4 mmol/L (ref 3.4–4.8)
PROTEIN TOTAL: 6.1 g/dL (ref 5.7–8.2)
SODIUM: 143 mmol/L (ref 135–145)

## 2022-10-30 MED ADMIN — promethazine (PHENERGAN) 6.5 mg in sodium chloride (NS) 0.9 % 50 mL IVPB: 6.5 mg | INTRAVENOUS | @ 03:00:00

## 2022-10-30 MED ADMIN — ondansetron (ZOFRAN) injection 4 mg: 4 mg | INTRAVENOUS | @ 23:00:00

## 2022-10-30 MED ADMIN — baclofen (LIORESAL) tablet 10 mg: 10 mg | ORAL | @ 21:00:00

## 2022-10-30 MED ADMIN — carboxymethylcellulose sodium (REFRESH CELLUVISC) 1 % ophthalmic gel 1 drop: 1 [drp] | OPHTHALMIC | @ 21:00:00

## 2022-10-30 MED ADMIN — cetirizine (ZYRTEC) tablet 10 mg: 10 mg | GASTROENTERAL | @ 13:00:00

## 2022-10-30 MED ADMIN — pantoprazole (Protonix) oral suspension: 40 mg | ORAL | @ 02:00:00

## 2022-10-30 MED ADMIN — diclofenac sodium (VOLTAREN) 1 % gel 2 g: 2 g | TOPICAL | @ 17:00:00

## 2022-10-30 MED ADMIN — gabapentin (NEURONTIN) capsule 100 mg: 100 mg | ORAL | @ 13:00:00

## 2022-10-30 MED ADMIN — pantoprazole (Protonix) oral suspension: 40 mg | ORAL | @ 15:00:00

## 2022-10-30 MED ADMIN — ceFAZolin (ANCEF) IVPB 2 g in 50 ml dextrose (premix): 2 g | INTRAVENOUS | @ 09:00:00 | Stop: 2022-11-02

## 2022-10-30 MED ADMIN — sodium chloride (NS) 0.9 % infusion: 75 mL/h | INTRAVENOUS | @ 17:00:00 | Stop: 2022-11-01

## 2022-10-30 MED ADMIN — HYDROmorphone (PF) (DILAUDID) injection 1 mg: 1 mg | INTRAVENOUS | @ 23:00:00 | Stop: 2022-11-04

## 2022-10-30 MED ADMIN — HYDROmorphone (PF) (DILAUDID) injection 1 mg: 1 mg | INTRAVENOUS | @ 06:00:00 | Stop: 2022-11-04

## 2022-10-30 MED ADMIN — ceFAZolin (ANCEF) IVPB 2 g in 50 ml dextrose (premix): 2 g | INTRAVENOUS | @ 21:00:00 | Stop: 2022-11-02

## 2022-10-30 MED ADMIN — LORazepam (ATIVAN) injection 1 mg: 1 mg | INTRAVENOUS | @ 05:00:00

## 2022-10-30 MED ADMIN — senna (SENOKOT) tablet 2 tablet: 2 | ORAL | @ 03:00:00

## 2022-10-30 MED ADMIN — HYDROmorphone (PF) (DILAUDID) injection 1 mg: 1 mg | INTRAVENOUS | @ 03:00:00 | Stop: 2022-11-04

## 2022-10-30 MED ADMIN — promethazine (PHENERGAN) 6.5 mg in sodium chloride (NS) 0.9 % 50 mL IVPB: 6.5 mg | INTRAVENOUS | @ 11:00:00

## 2022-10-30 MED ADMIN — carboxymethylcellulose sodium (REFRESH CELLUVISC) 1 % ophthalmic gel 1 drop: 1 [drp] | OPHTHALMIC | @ 17:00:00

## 2022-10-30 MED ADMIN — buprenorphine-naloxone (SUBOXONE) 2-0.5 mg SL film 0.5 mg of buprenorphine: .25 | SUBLINGUAL | @ 13:00:00

## 2022-10-30 MED ADMIN — HYDROmorphone (PF) (DILAUDID) injection 1 mg: 1 mg | INTRAVENOUS | @ 09:00:00 | Stop: 2022-11-04

## 2022-10-30 MED ADMIN — HYDROmorphone (PF) (DILAUDID) injection 1 mg: 1 mg | INTRAVENOUS | @ 20:00:00 | Stop: 2022-11-04

## 2022-10-30 MED ADMIN — famotidine (PEPCID) tablet 20 mg: 20 mg | ORAL | @ 02:00:00

## 2022-10-30 MED ADMIN — oxyCODONE (ROXICODONE) immediate release tablet 10 mg: 10 mg | ORAL | @ 02:00:00 | Stop: 2022-11-02

## 2022-10-30 MED ADMIN — carboxymethylcellulose sodium (REFRESH CELLUVISC) 1 % ophthalmic gel 1 drop: 1 [drp] | OPHTHALMIC | @ 09:00:00

## 2022-10-30 MED ADMIN — baclofen (LIORESAL) tablet 10 mg: 10 mg | ORAL | @ 13:00:00

## 2022-10-30 MED ADMIN — naloxegol (MOVANTIK) tablet 25 mg: 25 mg | ORAL | @ 15:00:00

## 2022-10-30 MED ADMIN — ceFAZolin (ANCEF) IVPB 2 g in 50 ml dextrose (premix): 2 g | INTRAVENOUS | @ 02:00:00 | Stop: 2022-11-02

## 2022-10-30 MED ADMIN — gabapentin (NEURONTIN) capsule 100 mg: 100 mg | ORAL | @ 03:00:00

## 2022-10-30 MED ADMIN — buprenorphine-naloxone (SUBOXONE) 2-0.5 mg SL film 0.5 mg of buprenorphine: .25 | SUBLINGUAL | @ 03:00:00

## 2022-10-30 MED ADMIN — fluconazole (DIFLUCAN) tablet 800 mg: 800 mg | ORAL | @ 21:00:00 | Stop: 2022-10-30

## 2022-10-30 MED ADMIN — gabapentin (NEURONTIN) capsule 100 mg: 100 mg | ORAL | @ 21:00:00

## 2022-10-30 MED ADMIN — mirtazapine (REMERON) tablet 15 mg: 15 mg | GASTROENTERAL | @ 03:00:00

## 2022-10-30 MED ADMIN — buprenorphine-naloxone (SUBOXONE) 2-0.5 mg SL film 0.5 mg of buprenorphine: .25 | SUBLINGUAL | @ 21:00:00

## 2022-10-30 MED ADMIN — HYDROmorphone (PF) (DILAUDID) injection 1 mg: 1 mg | INTRAVENOUS | @ 13:00:00 | Stop: 2022-11-04

## 2022-10-30 MED ADMIN — polyethylene glycol (MIRALAX) packet 17 g: 17 g | ORAL | @ 13:00:00

## 2022-10-30 MED ADMIN — HYDROmorphone (PF) (DILAUDID) injection 1 mg: 1 mg | INTRAVENOUS | @ 16:00:00 | Stop: 2022-11-04

## 2022-10-30 MED ADMIN — promethazine (PHENERGAN) 6.5 mg in sodium chloride (NS) 0.9 % 50 mL IVPB: 6.5 mg | INTRAVENOUS | @ 16:00:00

## 2022-10-30 MED ADMIN — carboxymethylcellulose sodium (REFRESH CELLUVISC) 1 % ophthalmic gel 1 drop: 1 [drp] | OPHTHALMIC | @ 03:00:00

## 2022-10-30 MED ADMIN — baclofen (LIORESAL) tablet 10 mg: 10 mg | ORAL | @ 02:00:00

## 2022-10-30 NOTE — Unmapped (Signed)
Problem: Adult Inpatient Plan of Care  Goal: Plan of Care Review  Outcome: Progressing  Goal: Patient-Specific Goal (Individualized)  Outcome: Progressing  Goal: Absence of Hospital-Acquired Illness or Injury  Outcome: Progressing  Intervention: Identify and Manage Fall Risk  Recent Flowsheet Documentation  Taken 10/30/2022 0200 by Ruby Cola, RN BSN  Safety Interventions:   fall reduction program maintained   environmental modification   low bed   lighting adjusted for tasks/safety  Taken 10/30/2022 0000 by Ruby Cola, RN BSN  Safety Interventions: aspiration precautions  Intervention: Prevent Skin Injury  Recent Flowsheet Documentation  Taken 10/30/2022 0200 by Ruby Cola, RN BSN  Positioning for Skin: Supine/Back  Taken 10/30/2022 0000 by Ruby Cola, RN BSN  Positioning for Skin: Supine/Back  Device Skin Pressure Protection:   absorbent pad utilized/changed   adhesive use limited  Skin Protection: adhesive use limited  Intervention: Prevent and Manage VTE (Venous Thromboembolism) Risk  Recent Flowsheet Documentation  Taken 10/30/2022 0200 by Ruby Cola, RN BSN  Anti-Embolism Device Type: SCD, Knee  Anti-Embolism Intervention: Refused  Anti-Embolism Device Location: BLE  Taken 10/30/2022 0000 by Ruby Cola, RN BSN  Anti-Embolism Device Type: SCD, Knee  Anti-Embolism Intervention: Refused  Anti-Embolism Device Location: BLE  Intervention: Prevent Infection  Recent Flowsheet Documentation  Taken 10/30/2022 0000 by Ruby Cola, RN BSN  Infection Prevention:   environmental surveillance performed   hand hygiene promoted   rest/sleep promoted   single patient room provided  Goal: Optimal Comfort and Wellbeing  Outcome: Progressing  Goal: Readiness for Transition of Care  Outcome: Progressing  Goal: Rounds/Family Conference  Outcome: Progressing     Problem: Fall Injury Risk  Goal: Absence of Fall and Fall-Related Injury  Outcome: Progressing  Intervention: Promote Scientist, clinical (histocompatibility and immunogenetics) Documentation  Taken 10/30/2022 0200 by Ruby Cola, RN BSN  Safety Interventions:   fall reduction program maintained   environmental modification   low bed   lighting adjusted for tasks/safety  Taken 10/30/2022 0000 by Ruby Cola, RN BSN  Safety Interventions: aspiration precautions     Problem: Self-Care Deficit  Goal: Improved Ability to Complete Activities of Daily Living  Outcome: Progressing     Problem: Pain Acute  Goal: Optimal Pain Control and Function  Outcome: Progressing     Problem: Infection  Goal: Absence of Infection Signs and Symptoms  Outcome: Progressing  Intervention: Prevent or Manage Infection  Recent Flowsheet Documentation  Taken 10/30/2022 0000 by Ruby Cola, RN BSN  Infection Management: aseptic technique maintained

## 2022-10-30 NOTE — Unmapped (Signed)
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 She was put back on TPN and remained admitted due to abdominal pain and debilitating muscle spasms/rigidity of uncertain etiology. Hospital course now further complicated by bacteremia/fungemia.     GNR bacteremia/Candida fungemia 8/13 : Source now presumably her central line that she was getting TPN through. She is no longer having fevers and is clinically slowly improving . Central line exchanged 8/14. Negative cultures since 8/13 . TEE without vegetations   - Consulted ID appreciate recs  - Follow-up repeat cultures  - Continue Cefazolin and micafungin with course to be determined by ID. Likely 2 weeks of antibiotics       Muscle rigidity, increased tone and spasms : Occurred post-procedurally, with reassuring evaluation (MRI spine, neuro c/s, CK normal) at that time. Increased tone seems to worsen during acute stress to her body (anesthesia, infection). MRI brain normal . She had increased titer of calcium channel antibody testing and Pincus Badder syndrome remains on differential    - Obtain EMG testing today   - Hold baclofen with concern for lambert eaton      Chronic abdominal pain, nausea 2/2 desmoid tumors and prior surgeries: Continues to have significant nausea with inadequate intake although improving since starting tube feeds  - Continue scheduled bowel regimen      Elevated LFTs, intermittent: LFTs continue to fluctuate.  Unclear etiology and previously thought related to TPN versus chemotherapy although is now not on either.  -Continue to monitor daily  -Will consider expanded workup if persistent rise      Iron deficiency anemia: Ferritin 5, iron saturation 6%. Has had prior reactions with numerous attempts to give IV iron.      Headaches:  - Enteral PRN tylenol, fioricet daily prn could be considered if remains severe  - Avoid NSAIDs, avoid IV benadryl     Desmoid tumors - [name]: Chemotherapy on hold in setting of infections.      VTE PPX: Lovenox    Disposition: Skilled nursing/AIR  Medically Clear: No    I personally spent greater than 55 minutes face-to-face and non-face-to-face in the care of this patient, which includes all pre, intra, and post visit time on the date of service.  All documented time was specific to the E/M visit and does not include any procedures that may have been performed.  __________________________________________________________________    Subjective:    No significant change in symptoms this mornings, nausea maybe slightly improved. Was able to get the bathroom with assistance yesterday.     Objective    Temp:  [36.5 ??C (97.7 ??F)-36.9 ??C (98.4 ??F)] 36.8 ??C (98.2 ??F)  Heart Rate:  [65-89] 70  SpO2 Pulse:  [79] 79  Resp:  [9-18] 17  BP: (94-122)/(48-66) 104/57  SpO2:  [98 %-100 %] 100 %    Chronically ill-appearing young female sitting upright in bed appearing tired, NAD  Moist mucous membranes, nasal gastric tube in place  Heart rate and rhythm normal, no murmurs  Lungs clear to auscultation bilaterally  Abdomen soft, nontender  No lower extremity edema  Bilateral LE rigidity with reduced reflexes similar to prior     Recent Results (from the past 24 hour(s))   Comprehensive Metabolic Panel    Collection Time: 10/30/22  5:28  AM   Result Value Ref Range    Sodium 143 135 - 145 mmol/L    Potassium 3.4 3.4 - 4.8 mmol/L    Chloride 107 98 - 107 mmol/L    CO2 30.0 20.0 - 31.0 mmol/L    Anion Gap 6 5 - 14 mmol/L    BUN 8 (L) 9 - 23 mg/dL    Creatinine 1.61 (L) 0.55 - 1.02 mg/dL    BUN/Creatinine Ratio 17     eGFR CKD-EPI (2021) Female >90 >=60 mL/min/1.74m2    Glucose 106 70 - 179 mg/dL    Calcium 9.2 8.7 - 09.6 mg/dL    Albumin 3.4 3.4 - 5.0 g/dL    Total Protein 6.1 5.7 - 8.2 g/dL Total Bilirubin 0.2 (L) 0.3 - 1.2 mg/dL    AST 14 <=04 U/L    ALT 62 (H) 10 - 49 U/L    Alkaline Phosphatase 186 (H) 46 - 116 U/L     Labs/Studies:  Labs and Studies from the last 24hrs per EMR and Reviewed      Principal Problem:    Other acute postprocedural pain  Active Problems:    [name]    Intestinal polyps    [name]    Neoplasm related pain    Physical deconditioning    History of colectomy    Intractable abdominal pain    Generalized anxiety disorder with panic attacks    SBO (small bowel obstruction) (CMS-HCC)    Small intestinal bacterial overgrowth (SIBO)

## 2022-10-30 NOTE — Unmapped (Signed)
Division of Infectious Diseases  General Inpatient Consultation Service     For questions about this consult, page [phone] (Gen A Follow-up Pager).    [name] [name] is being seen in consultation at the request of [name], MD for evaluation and management of candida fungemia and GNR bacteremia.     PLAN FOR 10/30/2022    Diagnostic  Continue to monitor LFTs, if worsening please obtain a liver ultrasound.  Monitor for antimicrobial toxicity with the following:  CBC w/diff at least once per week  clinical assessments for rashes or other skin changes  ECG at baseline and after 3 days of dosing to assess QTc changes  CMP daily for now    Treatment  Continue cefazolin 2g q8h  Stop micafungin and start fluconazole 800 mg PO on day 1 followed by 400 mg PO daily  Duration of therapy = 2 weeks  start date = 10/25/22  end date = 11/08/22    I discussed the plans for today with primary team, family/caregiver(s), and patient on 10/30/2022.    Our service will sign off.    I personally spent 30 mins face-to-face and non-face-to-face in the care of this patient on 10/30/2022, which includes all pre, intra, and post visit time on the date of service.  All documented time was specific to the E/M visit and does not include any procedures that may have been performed.    [name], MD  [place] Division of Infectious Diseases                MDM and Problem-Specific Assessments  ( .00ID2DAY  /  .91YNWGNFAOZH  /  .IDSS  / .[name] )     22 y.o. 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 Subsequently found to have Candidemia and Enterobacteriaceae (species undetermined) bacteremia, likely due to TPN/CVC, now s/p RIJ removal on 8/14. TTE with possible vegetations in right atrium, but no vegetations seen on TEE (noted prominent eustachian valve and chiari network).     October 30, 2022   Patient has: []  acute illness w/systemic sxs  [mod] [x]  illness posing risk to life or function  [high]   I reviewed:   (3+) [x]  primary team note [x]  consultant note(s) []  procedure/op note(s) [x]  micro result(s)    []  CBC results [x]  chemistry results []  radiology report(s) []  nursing note(s)   I independently visualized:   (any)   []  cxs/plates in lab []  plain film images []  CT images []  PET images    []  path slide(s) []  ECG tracing []  MRI images []  nuclear scan   I discussed: (any) []  micro and/or path w/lab personnel []  drug options and/or interactions w/ID pharmD    []  procedure/OR findings w/other MD(s) []  echo and/or imaging w/other MD(s)    [x]  mgm't w/attending(s) involved in case []  setting up home abx w/OPAT team   Mgm't requires: []  prescription drug(s)  [mod] [x]  intensive toxicity monitoring  [high]     Interval notes & result reports show: Afebrile. Hypotensive 94/52 on 2L [place]. No acute events overnight.    Patient reports: Feeling lightheaded when she ambulates and her heart rate goes up. She reports continued abdominal pain and nausea. Taking some small bites by mouth.    My interpretation of the data I reviewed is: No new CBC. AST/ALT 14/62, remain  elevated. Alk phos 186. 8/17 Bcx finalized NG. No new imaging.    # GNR bacteremia  - acute, poses threat to life or bodily function  [high]  #Candida albicans fungemia - acute, poses threat to life or bodily function  [high]  Patient admitted since 7/18 with course c/b worsening RLQ abdominal pain in setting of new recurrent fevers and tachycardia. Of note, R IJ for TPN had been present since 01/2022 (since removed/replaced). Blood cultures on 8/13 grew GNR, lactose fermenter, family Enterobacteriaceae, blood culture also growing yeast (Candida albicans). Blood cultures cleared on 8/17. TTE (8/15) demonstrated mobile echodensity in right atrium, cannot rule out vegetation, but TEE on 8/19 revealed prominent eustachian valve and chiari network, no apparent valvular vegetations noted.   Management is as per the blue box above.    # Background of Gardner's syndrome, desmoid tumors-  chronic, exacerbation / progression / sfx of tx  [mod]  Diagnosed around the age of 1.    # Circumferential bladder wall thickening on CT from 10/22/2022 - acute, stable  [low]  Ua from 10/22/2022 positive.  Management is as per the blue box above.    # Muscle rigidity, increased tone and spasms   - acute, systemic symptoms  [mod]  09/25/2022 had injection of desmoid tumors with steroid/anesthetic under US guidance. For desmoid tumors in right forearm and left chest wall. Had multiple injections like these in the past without complications.  Admitted on 7.19 for observations , her workup included MRI spine and CK which were unremarkable.  Management per primary team.    # Management of prescription antimicrobials needing intensive toxicity monitoring - acute, poses threat to life or bodily function  [high]  Beta-lactams (penicillins, cephalosporins, and carbapenems) can cause rashes, loose stools, nephrotoxicity, and/or myelosuppression.  Echinocandin antigfungals can cause phlebitis, skin rashes, N/V/D, hepatotoxicity, neutropenia, thrombocytopenia, and/or infusion-related flushing, pruritis, or fever.   See recommendations in blue box above.    # Disposition  TBD            Antimicrobials & Other Medications  ( .00IDGANTT  /  .00IDGANTTLIST  )     Current  Micafungin 8/14-  Cefazolin 8/20-    Previous  Metronidazole 8/13-8/16  Vancomycin 8/13-8/15  Cefepime 8/13-8/20    Immunomodulators and antipyretics  None       Current Medications as of 10/30/2022  Scheduled  PRN   baclofen, 10 mg, TID  bisacodyl, 10 mg, BID  buprenorphine-naloxone, 0.25 Film, TID  carboxymethylcellulose sodium, 1 drop, QID  ceFAZolin, 2 g, Q8H  cetirizine, 10 mg, Daily  diclofenac sodium, 2 g, QID  enoxaparin (LOVENOX) injection, 40 mg, Q24H SCH  famotidine, 20 mg, Nightly  gabapentin, 100 mg, TID  micafungin, 100 mg, Q24H  mirtazapine, 15 mg, Nightly  naloxegol, 25 mg, Daily  [Provider Hold] nirogacestat, 50 mg, BID  pantoprazole, 40 mg, BID  polyethylene glycol, 17 g, BID  senna, 2 tablet, Nightly      acetaminophen, 650 mg, Q6H PRN  alum-mag-simeth, 60 mL, Q6H PRN  calcium carbonate, 400 mg elem calcium, Daily PRN  carboxymethylcellulose sodium, 2 drop, QID PRN  Chemo Clarification Order, , Continuous PRN  guaiFENesin, 200 mg, Q4H PRN  hydrocortisone, , BID PRN  HYDROmorphone, 1 mg, Q3H PRN  IP okay to treat, , Continuous PRN  lidocaine, , TID PRN  LORazepam, 1 mg, BID PRN  LORazepam, 0.5 mg, TID PRN  lidocaine-diphenhydrAMINE-aluminum-magnesium, 10 mL, QID PRN  ondansetron, 4 mg, Q12H PRN  oxyCODONE, 10 mg,  Q4H PRN  phenol, 2 spray, Q2H PRN  polyethylene glycol, 17 g, Daily PRN  promethazine, 6.5 mg, Q6H PRN  pseudoephedrine, 30 mg, Daily PRN  zinc oxide-cod liver oil, , Daily PRN           Physical Exam     Temp:  [36.5 ??C (97.7 ??F)-36.9 ??C (98.4 ??F)] 36.5 ??C (97.7 ??F)  Heart Rate:  [77-89] 89  SpO2 Pulse:  [79-88] 79  Resp:  [9-18] 17  BP: (94-122)/(48-66) 94/52  MAP (mmHg):  [65-77] 65  SpO2:  [98 %-100 %] 100 %    Actual body weight: 54 kg (119 lb)  Ideal body weight: 57 kg (125 lb 10.6 oz)      Const [x]  vital signs above      []  WDWN, NAD, non-toxic appearance  [x]  Uncomfortable appearing, NAD      Eyes   []  Lids normal bilaterally, conjunctiva anicteric and noninjected OU  []  PERRL   []        ENMT     [x]  Normal appearance of external nose and ears       [x]  No visible thrush  []  MMM, no lesions on lips or gums, dentition good        []  Hearing normal   [x]  NG tube in place.      Neck    []  Neck of normal appearance and trachea midline        []  No thyromegaly, nodules, or tenderness   [x]  RIJ central line in place      Lymph    []  No LAD in neck       []  No LAD in supraclavicular area       []  No LAD in axillae   []  No LAD in epitrochlear chains       []  No LAD in inguinal areas  []        CV    [x]  RRR, no m/r/g, S1/S2       []  No peripheral edema, WWP       []  Pedal pulses intact   []        Resp   [x]  Normal WOB       []  CTAB   []        GI   []  Normal inspection, NTND, NABS       []  No umbilical hernia on exam       []  No hepatosplenomegaly       []  Inspection of perineal and perianal areas normal  [x]  Distended, tender to palpation in RLQ and RUQ, hypogastric regions      GU   []  Normal external genitalia       []        MSK   []  No clubbing or cyanosis of hands       []  No focal tenderness or abnormalities on palpation of joints in RUE, LUE, RLE, or LLE  []        Skin   []  No rashes, lesions, or ulcers of visualized skin       [x]  Skin warm and dry to palpation   []        Neuro   [x]  CNs II-XII grossly intact       []  Sensation to light touch grossly intact throughout   []  DTRs normal and symmetric throughout   []  Unable to assess due to critical illness, sedation, or mental status  []        Psych   []  Appropriate  affect      []  Oriented to person, place, time      []  Judgment and insight are appropriate   []  Unable to assess due to critical illness, sedation, or mental status  []           Patient Lines/Drains/Airways Status       Active Active Lines, Drains, & Airways       Name Placement date Placement time Site Days    CVC Triple Lumen 10/22/22 Non-tunneled Right Internal jugular 10/22/22  1521  Internal jugular  7    Feeding Tube Other (Comment) 14 Fr. Left nare 10/28/22  1730  Left nare  1                      Data for ID Decision Making  ( IDGENCONMDM )       Micro & Serological Data   ( RSLTMICRO  /  [id number]  /  00CXSRC  /  00CXRES  /  00CXSUSC )    Microbiology Results (last day)       Procedure Component Value Date/Time Date/Time    Blood Culture #1 [[id number]]  (Normal) Collected: 10/25/22 0609    Lab Status: Final result Specimen: Blood from 1 Peripheral Draw Updated: 10/30/22 0630     Blood Culture, Routine No Growth at 5 days    Blood Culture #2 [[id number]]  (Normal) Collected: 10/25/22 0610    Lab Status: Final result Specimen: Blood from 1 Peripheral Draw Updated: 10/30/22 0630     Blood Culture, Routine No Growth at 5 days          7/19 Bcx NGTD  8/13 Bcx 1/2 GNR, family enterobacteriaceae (R-ampi; S-cefazolin, cipro, gent, levo, pip+tazo), C.albicans  8/13 Bcx 2/2 NGTD  8/15 Ux <10,000 CFU/mL Candida albicans  8/17 Bcx 2/2 NGTD    Recent Studies  ( RISRSLT )    Echocardiogram Transesophageal Echo  Result Date: 10/28/2022  Summary   1. TEE to evaluate for endocarditis.   2. Prominent eustachian valve and chiari network visualized within right atrium, infectious etiology is felt to be less likely.   3. No other apparent valvular vegetations.   4. Catheter tip noted in SVC without associated vegetation.       Initial Consult Documentation from October 22, 2022     Sources of information include: chart review, patient, and family/friend(s).    History of Present Illness:     22 y.o. 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    Patient has been admitted to [facility] since 7/18 with chronic abdominal pain and restarted on TPN in the setting of low PO intake. Patient was noted with chronic abdominal pain and nausea that is secondary to desmoid tumors and prior surgeries. More recently on 8/11, patient was noted with peristalsis in right lower quadrant. She was last noted with bowel movement on 8/11. Since, she has had worsening abdominal pain. Overnight of 8/12, patient was febrile to 38.7 despite tylenol and tachycardic to 140s. Since, patient has been having recurrent fevers and tachycardic with no leukocytosis. CTAP (8/13) was unremarkable for acute processes and demonstrated no small bowel obstruction. Blood cultures on 8/13 grew 1/2 GNR. Patient was started on cefepime, vancomycin, and metronidazole on 8/13. Right IJ was removed on 8/14.  Of note, pouchoscopy on 02/2022 showed 1cm of circumferential ulceration with severe, active pouchitis which improved ~2 months later. Repeat pouchoscopy on 07/16/22 demonstrated small ulcerations on suture line and a rectal cuff beginning 1cm from anal verge and extending 2cm with healthy appearing mucosa. Patient has been on TPN since 09/28/22 through nontunneled right IJ that has been present since 01/2022.    Past Medical History   Patient  has a past medical history of Abdominal pain, Acid reflux, Anesthesia complication, Cancer (CMS-HCC), Cataract of right eye, COVID-19 virus infection (01/2019), Cyst of thyroid determined by ultrasound, [name], Difficult intravenous access, [name] (familial adenomatous polyposis), [name], Gastric polyps, History of chemotherapy, History of colon polyps, History of COVID-19 (01/2019), Iron deficiency anemia due to chronic blood loss, PONV (postoperative nausea and vomiting), Rectal bleeding, and Syncopal episodes.      Meds and Allergies  Patient has a current medication list which includes the following prescription(s): lorazepam, promethazine, acetaminophen, baclofen, cefdinir, cholecalciferol (vitamin d3 25 mcg (1,000 units)), clobetasol, diclofenac sodium, doxycycline, famotidine, hydrocortisone, lidocaine, lorazepam, mirtazapine, naloxone, nirogacestat, oxycodone, pimecrolimus, triamcinolone, and zinc oxide, and the following Facility-Administered Medications: acetaminophen, alum-mag-simeth, baclofen, bisacodyl, buprenorphine-naloxone, calcium carbonate, carboxymethylcellulose sodium, carboxymethylcellulose sodium, cefazolin in 50ml dextrose, cetirizine, CHEMO CLARIFICATION ORDER, diclofenac sodium, enoxaparin, famotidine, gabapentin, guaifenesin, hydrocortisone, hydromorphone (pf), IP OKAY TO TREAT, lidocaine, lorazepam, lorazepam, micafungin (MYCAMINE) 100 mg in sodium chloride 0.9 % (NS) 100 mL IVPB-MBP, mirtazapine, lidocaine-diphenhydramine-aluminum-magnesium, naloxegol, [Provider Hold] nirogacestat, ondansetron, oxycodone, pantoprazole, phenol, polyethylene glycol, polyethylene glycol, promethazine (PHENERGAN) 6.5 mg in sodium chloride (NS) 0.9 % 50 mL IVPB, pseudoephedrine, senna, sodium chloride, and zinc oxide-cod liver oil.    Allergies: Adhesive tape-silicones; Ferrlecit [sodium ferric gluconat-sucrose]; Levofloxacin; Methylnaltrexone; Neomycin; Papaya; Morphine; Zosyn [piperacillin-tazobactam]; Compazine [prochlorperazine]; Iron analogues; Iron dextran; and Latex, natural rubber       Social History  Patient  reports that she has never smoked. She has been exposed to tobacco smoke. She has never used smokeless tobacco. She reports that she does not drink alcohol and does not use drugs.       Scribe's Attest:  [name], MD obtained and performed the history, physical exam and medical decision making elements that were entered into the chart. Documentation assistance was provided by me personally. Signed by Paulina Fusi, Scribe, on October 30, 2022 at 9:52 AM.     Provider???s Attestation:   Documentation assistance provided by the Scribe, Paulina Fusi. I was present during the time the encounter was [name], MD. October 30, 2022 at 9:52 AM

## 2022-10-30 NOTE — Unmapped (Signed)
Problem: Adult Inpatient Plan of Care  Goal: Plan of Care Review  Outcome: Ongoing - Unchanged  Goal: Patient-Specific Goal (Individualized)  Outcome: Ongoing - Unchanged  Goal: Absence of Hospital-Acquired Illness or Injury  Outcome: Ongoing - Unchanged  Intervention: Identify and Manage Fall Risk  Recent Flowsheet Documentation  Taken 10/29/2022 1800 by Natalia Leatherwood, RN  Safety Interventions: aspiration precautions  Taken 10/29/2022 1600 by Natalia Leatherwood, RN  Safety Interventions: aspiration precautions  Taken 10/29/2022 1400 by Natalia Leatherwood, RN  Safety Interventions: aspiration precautions  Taken 10/29/2022 1200 by Natalia Leatherwood, RN  Safety Interventions: aspiration precautions  Taken 10/29/2022 1000 by Natalia Leatherwood, RN  Safety Interventions: aspiration precautions  Taken 10/29/2022 0800 by Natalia Leatherwood, RN  Safety Interventions: aspiration precautions  Intervention: Prevent Skin Injury  Recent Flowsheet Documentation  Taken 10/29/2022 1800 by Natalia Leatherwood, RN  Positioning for Skin: Other (Comment)  Device Skin Pressure Protection: absorbent pad utilized/changed  Skin Protection: adhesive use limited  Taken 10/29/2022 1600 by Natalia Leatherwood, RN  Positioning for Skin: Other (Comment)  Device Skin Pressure Protection: absorbent pad utilized/changed  Skin Protection: adhesive use limited  Taken 10/29/2022 1400 by Natalia Leatherwood, RN  Positioning for Skin: Other (Comment)  Device Skin Pressure Protection: absorbent pad utilized/changed  Skin Protection: adhesive use limited  Taken 10/29/2022 1200 by Natalia Leatherwood, RN  Positioning for Skin: Other (Comment)  Device Skin Pressure Protection: absorbent pad utilized/changed  Skin Protection: adhesive use limited  Taken 10/29/2022 1000 by Natalia Leatherwood, RN  Positioning for Skin: Other (Comment)  Device Skin Pressure Protection: absorbent pad utilized/changed  Skin Protection: adhesive use limited  Taken 10/29/2022 0800 by Natalia Leatherwood, RN  Positioning for Skin: Other (Comment)  Device Skin Pressure Protection: absorbent pad utilized/changed  Skin Protection: adhesive use limited  Taken 10/29/2022 0715 by Natalia Leatherwood, RN  Device Skin Pressure Protection: absorbent pad utilized/changed  Skin Protection: adhesive use limited  Intervention: Prevent and Manage VTE (Venous Thromboembolism) Risk  Recent Flowsheet Documentation  Taken 10/29/2022 1800 by Natalia Leatherwood, RN  Anti-Embolism Intervention: Refused  Taken 10/29/2022 1600 by Natalia Leatherwood, RN  VTE Prevention/Management: anticoagulant therapy  Anti-Embolism Intervention: Refused  Taken 10/29/2022 1400 by Natalia Leatherwood, RN  Anti-Embolism Intervention: Refused  Taken 10/29/2022 1200 by Natalia Leatherwood, RN  VTE Prevention/Management: anticoagulant therapy  Anti-Embolism Intervention: Refused  Taken 10/29/2022 1000 by Natalia Leatherwood, RN  Anti-Embolism Intervention: Refused  Taken 10/29/2022 0800 by Natalia Leatherwood, RN  Anti-Embolism Intervention: Refused  Taken 10/29/2022 0715 by Natalia Leatherwood, RN  VTE Prevention/Management: anticoagulant therapy  Anti-Embolism Intervention: Refused  Intervention: Prevent Infection  Recent Flowsheet Documentation  Taken 10/29/2022 1800 by Natalia Leatherwood, RN  Infection Prevention: environmental surveillance performed  Taken 10/29/2022 1600 by Natalia Leatherwood, RN  Infection Prevention: environmental surveillance performed  Taken 10/29/2022 1400 by Natalia Leatherwood, RN  Infection Prevention: environmental surveillance performed  Taken 10/29/2022 1200 by Natalia Leatherwood, RN  Infection Prevention: environmental surveillance performed  Taken 10/29/2022 1000 by Natalia Leatherwood, RN  Infection Prevention: environmental surveillance performed  Taken 10/29/2022 0800 by Natalia Leatherwood, RN  Infection Prevention: environmental surveillance performed  Goal: Optimal Comfort and Wellbeing  Outcome: Ongoing - Unchanged  Goal: Readiness for Transition of Care  Outcome: Ongoing - Unchanged  Goal: Rounds/Family Conference  Outcome: Ongoing - Unchanged

## 2022-10-31 MED ADMIN — LORazepam (ATIVAN) injection 1 mg: 1 mg | INTRAVENOUS | @ 16:00:00

## 2022-10-31 MED ADMIN — mirtazapine (REMERON) tablet 15 mg: 15 mg | GASTROENTERAL | @ 02:00:00

## 2022-10-31 MED ADMIN — diclofenac sodium (VOLTAREN) 1 % gel 2 g: 2 g | TOPICAL | @ 11:00:00

## 2022-10-31 MED ADMIN — carboxymethylcellulose sodium (REFRESH CELLUVISC) 1 % ophthalmic gel 1 drop: 1 [drp] | OPHTHALMIC | @ 21:00:00

## 2022-10-31 MED ADMIN — HYDROmorphone (PF) (DILAUDID) injection 1 mg: 1 mg | INTRAVENOUS | @ 02:00:00 | Stop: 2022-11-04

## 2022-10-31 MED ADMIN — carboxymethylcellulose sodium (REFRESH CELLUVISC) 1 % ophthalmic gel 1 drop: 1 [drp] | OPHTHALMIC | @ 01:00:00

## 2022-10-31 MED ADMIN — buprenorphine-naloxone (SUBOXONE) 2-0.5 mg SL film 0.5 mg of buprenorphine: .25 | SUBLINGUAL | @ 14:00:00

## 2022-10-31 MED ADMIN — senna (SENOKOT) tablet 2 tablet: 2 | ORAL | @ 02:00:00

## 2022-10-31 MED ADMIN — ceFAZolin (ANCEF) IVPB 2 g in 50 ml dextrose (premix): 2 g | INTRAVENOUS | @ 18:00:00 | Stop: 2022-11-08

## 2022-10-31 MED ADMIN — ondansetron (ZOFRAN) injection 4 mg: 4 mg | INTRAVENOUS | @ 23:00:00

## 2022-10-31 MED ADMIN — bisacodyl (DULCOLAX) suppository 10 mg: 10 mg | RECTAL | @ 02:00:00

## 2022-10-31 MED ADMIN — gabapentin (NEURONTIN) capsule 100 mg: 100 mg | ORAL | @ 02:00:00

## 2022-10-31 MED ADMIN — naloxegol (MOVANTIK) tablet 25 mg: 25 mg | ORAL | @ 14:00:00 | Stop: 2022-10-31

## 2022-10-31 MED ADMIN — HYDROmorphone (PF) (DILAUDID) injection 0.5 mg: .5 mg | INTRAVENOUS | @ 21:00:00 | Stop: 2022-11-04

## 2022-10-31 MED ADMIN — ceFAZolin (ANCEF) IVPB 2 g in 50 ml dextrose (premix): 2 g | INTRAVENOUS | @ 02:00:00 | Stop: 2022-11-02

## 2022-10-31 MED ADMIN — fluconazole (DIFLUCAN) tablet 400 mg: 400 mg | ORAL | @ 14:00:00 | Stop: 2022-11-09

## 2022-10-31 MED ADMIN — carboxymethylcellulose sodium (REFRESH CELLUVISC) 1 % ophthalmic gel 1 drop: 1 [drp] | OPHTHALMIC | @ 11:00:00

## 2022-10-31 MED ADMIN — buprenorphine-naloxone (SUBOXONE) 2-0.5 mg SL film 0.5 mg of buprenorphine: .25 | SUBLINGUAL | @ 02:00:00

## 2022-10-31 MED ADMIN — gabapentin (NEURONTIN) capsule 100 mg: 100 mg | ORAL | @ 14:00:00

## 2022-10-31 MED ADMIN — HYDROmorphone (PF) (DILAUDID) injection 1 mg: 1 mg | INTRAVENOUS | @ 07:00:00 | Stop: 2022-10-31

## 2022-10-31 MED ADMIN — gabapentin (NEURONTIN) capsule 100 mg: 100 mg | ORAL | @ 18:00:00

## 2022-10-31 MED ADMIN — promethazine (PHENERGAN) 6.5 mg in sodium chloride (NS) 0.9 % 50 mL IVPB: 6.5 mg | INTRAVENOUS | @ 07:00:00

## 2022-10-31 MED ADMIN — ceFAZolin (ANCEF) IVPB 2 g in 50 ml dextrose (premix): 2 g | INTRAVENOUS | @ 11:00:00 | Stop: 2022-11-08

## 2022-10-31 MED ADMIN — baclofen (LIORESAL) tablet 20 mg: 20 mg | ORAL | @ 18:00:00

## 2022-10-31 MED ADMIN — diazePAM (VALIUM) tablet 5 mg: 5 mg | ORAL | @ 20:00:00

## 2022-10-31 MED ADMIN — HYDROmorphone (PF) (DILAUDID) injection 0.5 mg: .5 mg | INTRAVENOUS | @ 18:00:00 | Stop: 2022-11-04

## 2022-10-31 MED ADMIN — HYDROmorphone (PF) (DILAUDID) injection 1 mg: 1 mg | INTRAVENOUS | @ 12:00:00 | Stop: 2022-10-31

## 2022-10-31 MED ADMIN — baclofen (LIORESAL) tablet 10 mg: 10 mg | ORAL | @ 02:00:00

## 2022-10-31 MED ADMIN — polyethylene glycol (MIRALAX) packet 17 g: 17 g | ORAL | @ 14:00:00 | Stop: 2022-10-31

## 2022-10-31 MED ADMIN — pantoprazole (Protonix) oral suspension: 40 mg | ORAL | @ 14:00:00

## 2022-10-31 MED ADMIN — promethazine (PHENERGAN) 6.5 mg in sodium chloride (NS) 0.9 % 50 mL IVPB: 6.5 mg | INTRAVENOUS | @ 01:00:00

## 2022-10-31 MED ADMIN — carboxymethylcellulose sodium (REFRESH CELLUVISC) 1 % ophthalmic gel 1 drop: 1 [drp] | OPHTHALMIC | @ 16:00:00

## 2022-10-31 MED ADMIN — famotidine (PEPCID) tablet 20 mg: 20 mg | ORAL | @ 02:00:00

## 2022-10-31 MED ADMIN — buprenorphine-naloxone (SUBOXONE) 2-0.5 mg SL film 0.5 mg of buprenorphine: .25 | SUBLINGUAL | @ 18:00:00

## 2022-10-31 MED ADMIN — baclofen (LIORESAL) tablet 20 mg: 20 mg | ORAL | @ 14:00:00

## 2022-10-31 MED ADMIN — diclofenac sodium (VOLTAREN) 1 % gel 2 g: 2 g | TOPICAL | @ 22:00:00

## 2022-10-31 MED ADMIN — cetirizine (ZYRTEC) tablet 10 mg: 10 mg | GASTROENTERAL | @ 14:00:00

## 2022-10-31 MED ADMIN — LORazepam (ATIVAN) injection 1 mg: 1 mg | INTRAVENOUS | @ 04:00:00

## 2022-10-31 MED ADMIN — diclofenac sodium (VOLTAREN) 1 % gel 2 g: 2 g | TOPICAL | @ 04:00:00

## 2022-10-31 MED ADMIN — promethazine (PHENERGAN) 6.5 mg in sodium chloride (NS) 0.9 % 50 mL IVPB: 6.5 mg | INTRAVENOUS | @ 16:00:00

## 2022-10-31 NOTE — Unmapped (Signed)
Pt is alert and oriented X4. Pt complained of pain chronic/acute throughout the shift. Interventions including medical provided during shift to manage pain. Continued plan of care was provided for pt during shift. Pt had family at bedside for entirety of shift. All safety and comfort measures were provided for pt during the shift.   Problem: Adult Inpatient Plan of Care  Goal: Plan of Care Review  Outcome: Progressing  Goal: Patient-Specific Goal (Individualized)  Outcome: Progressing  Goal: Absence of Hospital-Acquired Illness or Injury  Outcome: Progressing  Intervention: Identify and Manage Fall Risk  Recent Flowsheet Documentation  Taken 10/30/2022 0715 by Wille Glaser, RN  Safety Interventions:   bed alarm   fall reduction program maintained  Intervention: Prevent and Manage VTE (Venous Thromboembolism) Risk  Recent Flowsheet Documentation  Taken 10/30/2022 1315 by Wille Glaser, RN  VTE Prevention/Management: ambulation promoted  Intervention: Prevent Infection  Recent Flowsheet Documentation  Taken 10/30/2022 0715 by Wille Glaser, RN  Infection Prevention: environmental surveillance performed  Goal: Optimal Comfort and Wellbeing  Outcome: Progressing  Goal: Readiness for Transition of Care  Outcome: Progressing  Goal: Rounds/Family Conference  Outcome: Progressing     Problem: Latex Allergy  Goal: Absence of Allergy Symptoms  Outcome: Progressing     Problem: Fall Injury Risk  Goal: Absence of Fall and Fall-Related Injury  Outcome: Progressing  Intervention: Promote Injury-Free Environment  Recent Flowsheet Documentation  Taken 10/30/2022 0715 by Wille Glaser, RN  Safety Interventions:   bed alarm   fall reduction program maintained     Problem: Skin Injury Risk Increased  Goal: Skin Health and Integrity  Outcome: Progressing     Problem: Self-Care Deficit  Goal: Improved Ability to Complete Activities of Daily Living  Outcome: Progressing     Problem: Comorbidity Management  Goal: Blood Glucose Levels Within Targeted Range  Outcome: Progressing  Goal: Blood Pressure in Desired Range  Outcome: Progressing     Problem: Nausea and Vomiting  Goal: Nausea and Vomiting Relief  Outcome: Progressing     Problem: Infection  Goal: Absence of Infection Signs and Symptoms  Outcome: Progressing  Intervention: Prevent or Manage Infection  Recent Flowsheet Documentation  Taken 10/30/2022 0715 by Wille Glaser, RN  Infection Management: aseptic technique maintained     Problem: Pain Acute  Goal: Optimal Pain Control and Function  Outcome: Progressing     Problem: Malnutrition  Goal: Improved Nutritional Intake  Outcome: Progressing

## 2022-10-31 NOTE — Unmapped (Signed)
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 She was put back on TPN and remained admitted due to abdominal pain and debilitating muscle spasms/rigidity of uncertain etiology. Hospital course now further complicated by bacteremia/fungemia.     GNR bacteremia/Candida fungemia 8/13 : Source now presumably her central line that she was getting TPN through. She is no longer having fevers and is clinically slowly improving . Central line exchanged 8/14. Negative cultures since 8/13 . TEE without vegetations   - Consulted ID appreciate recs  - Continue cefazolin and fluconazole with end date 11/08/22       Muscle rigidity, increased tone and spasms : Occurred post-procedurally, with reassuring evaluation (MRI spine, neuro c/s, CK normal) at that time. Increased tone seems to worsen during acute stress to her body (anesthesia, infection). MRI brain normal . EMG normal. 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   -Start Diazepam 5mg  TID, will start at lower dose because increased bioavailability with fluconazole, would consider uptitrating if no response. From reading can be from 20-80 mg daily     Chronic abdominal pain, nausea 2/2 desmoid tumors and prior surgeries: Continues to have significant nausea with inadequate intake although improving since starting tube feeds  -Tube feeds at goal   -PRN IV ativan for breakthrough nausea   -Decrease PRN Dilaudid 1-> 0.5 mg q3 PRN. We discussed tapering off in the coming days   -PRN oxy 10-15      Elevated LFTs, intermittent: LFTs improving.  Unclear etiology although was potentially related to starvation   -Continue to monitor daily  -Will consider expanded workup if persistent rise      Iron deficiency anemia: Ferritin 5, iron saturation 6%. Has had prior reactions with numerous attempts to give IV iron.      Headaches:  - Enteral PRN tylenol, fioricet daily prn could be considered if remains severe  - Avoid NSAIDs, avoid IV benadryl     Desmoid tumors - [name]: Chemotherapy on hold in setting of infections.      DVT Prophylaxis  Med Administrations and Associated Flowsheet Values (last 24 hours)       None          Peripheral Vascular Interventions (last 24 hours):  Anti-Embolism Device Type: SCD, Knee  Anti-Embolism Intervention: Other (Comment)       Disposition: Skilled nursing/AIR  Medically Clear: No    I personally spent greater than 55 minutes face-to-face and non-face-to-face in the care of this patient, which includes all pre, intra, and post visit time on the date of service.  All documented time was specific to the E/M visit and does not include any procedures that may have been performed.  __________________________________________________________________    Subjective:    No acute events overnight, worsening nausea overnight after fluconazole. No significant change in symptoms otherwise.     Objective    Temp:  [36.6 ??C (97.9 ??F)-37 ??C (98.6 ??F)] 36.6 ??C (97.9 ??F)  Heart Rate:  [68-84] 68  Resp:  [16-18] 18  BP: (99-107)/(59-62) 105/62  SpO2:  [99 %-100 %] 100 %    Chronically ill-appearing young female sitting upright in bed appearing tired, NAD  Moist mucous membranes, nasal gastric tube in place  Heart rate and rhythm normal, no murmurs  Lungs clear to auscultation bilaterally  Abdomen soft, nontender  No lower extremity edema  Bilateral LE rigidity , I walked patient before and after IV ativan and she had improved gait and less rigidity     No results found for this or any previous visit (from the past 24 hour(s)).    Labs/Studies:  Labs and Studies from the last 24hrs per EMR and Reviewed      Principal Problem:    Other acute postprocedural pain  Active Problems:    [name]    Intestinal polyps    [name]    Neoplasm related pain    Physical deconditioning    History of colectomy    Intractable abdominal pain    Generalized anxiety disorder with panic attacks    SBO (small bowel obstruction) (CMS-HCC)    Small intestinal bacterial overgrowth (SIBO)

## 2022-10-31 NOTE — Unmapped (Addendum)
Patient alert and oriented x4 this shift. VSS on RA, on tele and cont pulse ox. Got a call from tele that the Heart rate went up when OOB. Remains on tube feeds, Osmolite 1.5 going at 40mL. 3 prn dilaudid were given. PRNs phenergan, and ativan were given. Worked with OT and got hair washed. Family at bedside. Call bell within reach, bed in low locked position.     Problem: Adult Inpatient Plan of Care  Goal: Plan of Care Review  Outcome: Ongoing - Unchanged  Flowsheets (Taken 10/31/2022 1244)  Plan of Care Reviewed With: patient  Goal: Patient-Specific Goal (Individualized)  Outcome: Ongoing - Unchanged  Goal: Absence of Hospital-Acquired Illness or Injury  Outcome: Ongoing - Unchanged  Intervention: Identify and Manage Fall Risk  Recent Flowsheet Documentation  Taken 10/31/2022 0730 by Ritta Slot, RN  Safety Interventions:   environmental modification   fall reduction program maintained   family at bedside  Intervention: Prevent Skin Injury  Recent Flowsheet Documentation  Taken 10/31/2022 0730 by Ritta Slot, RN  Positioning for Skin: Supine/Back  Intervention: Prevent and Manage VTE (Venous Thromboembolism) Risk  Recent Flowsheet Documentation  Taken 10/31/2022 1200 by Ritta Slot, RN  Anti-Embolism Intervention: Other (Comment)  Taken 10/31/2022 1000 by Ritta Slot, RN  Anti-Embolism Intervention: Other (Comment)  Taken 10/31/2022 0800 by Ritta Slot, RN  Anti-Embolism Intervention: Other (Comment)  Taken 10/31/2022 0730 by Ritta Slot, RN  Anti-Embolism Intervention: Other (Comment)  Intervention: Prevent Infection  Recent Flowsheet Documentation  Taken 10/31/2022 0730 by Ritta Slot, RN  Infection Prevention:   environmental surveillance performed   hand hygiene promoted   personal protective equipment utilized  Goal: Optimal Comfort and Wellbeing  Outcome: Ongoing - Unchanged  Goal: Readiness for Transition of Care  Outcome: Ongoing - Unchanged  Goal: Rounds/Family Conference  Outcome: Ongoing - Unchanged

## 2022-10-31 NOTE — Unmapped (Signed)
Brief Inpatient Consult Note         Recommendations          [name] [name] is a 21 y.o. female history of dermoid tumors status post numerous resections including paraspinal was admitted for postop pain and increased tone.  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    Increased Tone - Generalized Weakness  Initially she was noted to have worsening BLE stiffness since procedure on 7/18. Acute onset of muscle rigidity isolated to lower extremities associated with anesthesia. Differential includes prolonged anesthesia reaction, nutrition abnormalities, stiff person syndrome or other autoimmune myelopathy. 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 Due to concern for central process given increased tone, repeat MRI brain w wo was obtained which was normal. Nutritional work up completed to  evaluate for generalized dystonia as patient has had extended periods of TPN and thus far has only been remarkable with mildly low zinc, only mildly elevated manganese (not c/f toxicity) otherwise remainder has been unremarkable. Given unremarkable work up thus far, obtained AI/myelopathy panel to evaluate for stiff person syndrome however atypical to present with acute onset.  Had increased titer for calcium channel blocker antibody found on a paraneoplastic panel though EMG on 8/22 reassuring against Pincus Badder and her symptoms do not necessarily match for Lambert-Eaton syndrome.     8/23: primary team initiated trial of benzo for possible SPS with some subjective improvement after 1 dose.     Thank you for the consult.  Please page [phone] for any other concerns.  RECOMMENDATIONS:   Follow repeat autoimmune/paraneoplastic panel  Trial of benzo for possible SPS, will CTM for symptomatic improvement    This patient was seen and discussed with Dr. Dan Humphreys, who agrees to the assessment and plan.     Shea Stakes, MD  Resident Physician - PGY3  Department of Neurology

## 2022-10-31 NOTE — Unmapped (Signed)
Assumed [name] [name] care from [facility] @ 2345 . [name] [name] A & O x 4 and able to express needs . [name] [name] continued to c/o anxiety ,  abdominal pain with nausea . Denied SOB/dyspnea . PRN doses of Ativan , Dilaudid and Phenergan adm as ordered as per MAR . Assistance with ADLs provided . Falls and safety precautions reinfroced with bed low and locked , SR up x 2 , call bell within reach . [name] [name] resting quietly in bed with eyes closed . No acute distress noted . Will continue with current POC . Mother @ the bedside and very supportive .     Problem: Adult Inpatient Plan of Care  Goal: Plan of Care Review  Outcome: Progressing  Goal: Patient-Specific Goal (Individualized)  Outcome: Progressing  Flowsheets (Taken 10/31/2022 0939)  Patient/Family-Specific Goals (Include Timeframe): [name] [name] will have adequate pain mgmt and remain free from falls and injuries during this shift 7p -7a .  Individualized Care Needs: Vitals , labs , pain , nausea  and anxiety mgmt , falls/safety precautions  Anxieties, Fears or Concerns: [name] [name] concerned about pain , nausea and anxiety mgmt  Goal: Absence of Hospital-Acquired Illness or Injury  Outcome: Progressing  Intervention: Identify and Manage Fall Risk  Recent Flowsheet Documentation  Taken 10/31/2022 0600 by Harlow Ohms, RN  Safety Interventions:   fall reduction program maintained   environmental modification   low bed   family at bedside   nonskid shoes/slippers when out of bed  Taken 10/31/2022 0400 by Harlow Ohms, RN  Safety Interventions:   fall reduction program maintained   environmental modification   family at bedside   low bed   nonskid shoes/slippers when out of bed   room near unit station  Taken 10/31/2022 0200 by Harlow Ohms, RN  Safety Interventions:   fall reduction program maintained   environmental modification   low bed   nonskid shoes/slippers when out of bed   family at bedside  Taken 10/31/2022 0000 by Harlow Ohms, RN  Safety Interventions:   fall reduction program maintained   environmental modification   low bed   family at bedside   nonskid shoes/slippers when out of bed  Taken 10/30/2022 2345 by Harlow Ohms, RN  Safety Interventions:   fall reduction program maintained   environmental modification   family at bedside   low bed   nonskid shoes/slippers when out of bed  Intervention: Prevent Skin Injury  Recent Flowsheet Documentation  Taken 10/30/2022 2345 by Harlow Ohms, RN  Positioning for Skin: Supine/Back  Device Skin Pressure Protection: absorbent pad utilized/changed  Intervention: Prevent and Manage VTE (Venous Thromboembolism) Risk  Recent Flowsheet Documentation  Taken 10/31/2022 0600 by Annamaria Helling A, RN  Anti-Embolism Intervention: (VTE : Lovenox SQ) Other (Comment)  Taken 10/31/2022 0400 by Annamaria Helling A, RN  Anti-Embolism Intervention: (VTE : Lovenox SQ) Other (Comment)  Taken 10/31/2022 0200 by Annamaria Helling A, RN  Anti-Embolism Intervention: (VTE : Lovenox SQ) Other (Comment)  Taken 10/31/2022 0000 by Harlow Ohms, RN  Anti-Embolism Intervention: (VTE : Lovenox SQ) Other (Comment)  Taken 10/30/2022 2345 by Harlow Ohms, RN  VTE Prevention/Management: ambulation promoted  Anti-Embolism Intervention: (VTE : Lovenox SQ) Other (Comment)  Intervention: Prevent Infection  Recent Flowsheet Documentation  Taken 10/31/2022 0600 by Harlow Ohms, RN  Infection Prevention: rest/sleep promoted  Taken 10/31/2022 0200 by Harlow Ohms, RN  Infection Prevention: rest/sleep promoted  Taken 10/31/2022 0000 by Harlow Ohms, RN  Infection Prevention: rest/sleep promoted  Taken 10/30/2022 2345 by Harlow Ohms, RN  Infection Prevention: environmental surveillance performed  Goal: Optimal Comfort and Wellbeing  Outcome: Progressing  Goal: Readiness for Transition of Care  Outcome: Progressing  Goal: Rounds/Family Conference  Outcome: Progressing

## 2022-10-31 NOTE — Unmapped (Signed)
[facility]  Follow-Up Psychiatry Consult Note      Date of admission: 09/25/2022 10:30 AM  Service Date: October 31, 2022  Primary Team: [facility])  LOS:  LOS: 34 days      Assessment:   [name] [name] is a 22 y.o. 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 and reported past psych history of GAD and MDD, admitted 09/25/2022 10:30 AM for other acute postprocedural pain following injection of desmoid tumor with steroid and anesthetic under ultrasound guidance.  Patient was seen in consultation by request of hospitalist on [facility] for evaluation of anxiety and depression.      [name] [name] presents with symptoms consistent with a diagnosis of GAD and MDD, consistent with her historical diagnoses. Symptoms have worsened in the context of prolonged medical hospitalization, with increased anxiety/panic attacks.     Today, patient reports overall sense of improvement as she has been able to restart doing some of the things she used to enjoy (e.g., watching the Bachelorette and another show, diamond art). She is still wary about claiming she is improving especially given overnight stressors, but appears more hopeful about the future today compared to at other times during hospitalization. It is noted that patient will be starting standing diazepam for stiff-person syndrome. If anti-glutamic acid decarboxylase antibodies are not part of the autoimmune panel, would recommend drawing for further evaluation of this possibility.     Diagnoses:   Active Hospital problems:  Principal Problem:    Other acute postprocedural pain  Active Problems:    [name]    Intestinal polyps    [name]    Neoplasm related pain    Physical deconditioning    History of colectomy    Intractable abdominal pain    Generalized anxiety disorder with panic attacks    SBO (small bowel obstruction) (CMS-HCC)    Small intestinal bacterial overgrowth (SIBO)       Problems edited/added by me:  No problems updated.    Risk Assessment:  ASQ screening result: not completed    -A full risk assessment was previously performed on 7/31.  Risk assessment remains essentially unchanged.    Current suicide risk: low risk  Current homicide risk: low risk      Recommendations:     Safety and Observation Level:   -- This patient is not currently under IVC. If safety concerns arise, please page psychiatry for an evaluation.    Medications:  -- continue mirtazapine 15mg  q2200 for mood and anxiety  - continue lorazepam 1mg  IV BID for anxiety and nausea (home dose of 0.5mg  TID)    Further Work-up:   -- anti-GAD65 antibodies if not included in autoimmune panel    Behavioral / Environmental:   -- Utilize compassion and acknowledge the patient's experiences while setting clear and realistic expectations for care.     Follow-up:  -- When patient is discharged, please ensure that their AVS includes information about the 32 Suicide & Crisis Lifeline.  -- The patient currently receives mental health care with [facility] and we will attempt to coordinate followup with our psychiatry team on discharge. She is also recently established with a virtual DBT group at [facility] ([employer] LCSW) although it is unclear what the status of this group is   -- We will follow as needed at this time.     Thank you for this consult request. Recommendations have been  communicated to the primary team. Please page [phone]  for any questions or concerns.     Discussed with and seen by Attending, Elnita Maxwell, MD, who agrees with the assessment and plan.    [name], MD      Subjective     Relevant events since last seen by psychiatry: patient transferred to unit 83, had TEE done and EMG done    Patient Interview:  Patient seen with mother [name] [name] at bedside. They report difficulty sleeping overnight due to the transfer and some concerns that arose from that. Patient was able to restart doing some of the things that she enjoyed during the sleepless period however, such as diamond art and watching a show that she had not watched in a while, Unexpected. She enjoyed this. She is hesitant to say that things are improving due to anxiety about perhaps another medical concern popping up in the meantime. No concerns for psychiatry otherwise today    ROS:   All systems reviewed as negative/unremarkable aside from the following pertinent positives and negatives: ongoing anxiety    Collateral:   - Reviewed medical records in Epic    Relevant Updates to past psychiatric, medical/surgical, family, or social history: n/a    Current Medications:  Scheduled Meds:   baclofen  20 mg Oral TID    bisacodyl  10 mg Rectal BID    buprenorphine-naloxone  0.25 Film Sublingual TID    carboxymethylcellulose sodium  1 drop Both Eyes QID    ceFAZolin  2 g Intravenous Q8H    cetirizine  10 mg Enteral tube: gastric Daily    diazePAM  5 mg Oral TID    diclofenac sodium  2 g Topical QID    enoxaparin (LOVENOX) injection  40 mg Subcutaneous Q24H SCH    famotidine  20 mg Oral Nightly    fluconazole  400 mg Oral Daily    gabapentin  100 mg Oral TID    mirtazapine  15 mg Enteral tube: gastric Nightly    [START ON 11/01/2022] naloxegol  12.5 mg Oral Daily    [Provider Hold] nirogacestat  50 mg Oral BID    pantoprazole  40 mg Oral BID     Continuous Infusions:   Chemo Clarification Order      IP okay to treat      sodium chloride      sodium chloride 75 mL/hr (10/30/22 1251)     PRN Meds:.acetaminophen, alum-mag-simeth, calcium carbonate, carboxymethylcellulose sodium, Chemo Clarification Order, guaiFENesin, hydrocortisone, HYDROmorphone, IP okay to treat, lidocaine, LORazepam, lidocaine-diphenhydrAMINE-aluminum-magnesium, ondansetron, oxyCODONE **OR** oxyCODONE, phenol, polyethylene glycol, polyethylene glycol, promethazine, pseudoephedrine, senna, zinc oxide-cod liver oil      Objective:   Vital signs:   Temp:  [36.6 ??C (97.9 ??F)-37 ??C (98.6 ??F)] 36.6 ??C (97.9 ??F)  Heart Rate:  [68-153] 153  Resp:  [16-18] 18  BP: (99-107)/(59-62) 105/62  MAP (mmHg):  [72-75] 73  SpO2:  [99 %-100 %] 100 %    Physical Exam:  Gen: No acute distress.    Mental Status Exam:  Appearance:  appears stated age   Attitude:   calm, cooperative   Behavior/Psychomotor:  appropriate eye contact and no abnormal movements   Speech/Language:   normal rate, not pressured, normal volume, normal fluency. normal articulation   Mood:  ???ok???   Affect:  mood congruent and euthymic   Thought process:  logical, linear, clear, coherent, goal directed   Thought content:    denies thoughts of self-harm. Denies  SI, plans, or intent. Denies HI.  No grandiose, self-referential, persecutory, or paranoid delusions noted.   Perceptual disturbances:   denies auditory and visual hallucinations   Attention:  able to attend to interview without fluctuations in consciousness   Concentration:  Able to fully concentrate and attend   Orientation:  grossly oriented.   Memory:  not formally tested, but grossly intact   Fund of knowledge:   not formally assessed   Insight:    Intact   Judgment:   Intact   Impulse Control:  Intact     Relevant laboratory/imaging data was reviewed.    Additional Psychometric Testing:  Not applicable.    Consult Type and Time-Based Documentation:  This patient was evaluated in person.    Time-based billing disclaimer:  I personally spent 30   minutes face-to-face and non-face-to-face in the care of this patient, which includes all pre, intra, and post visit time on the date of service.  All documented time was specific to the E/M visit and does not include any procedures that may have been performed.

## 2022-10-31 NOTE — Unmapped (Signed)
[facility]  Clinical Neurophysiology Laboratory  [place], [place]         Patient: [name] [name] Sex: Female  [place] #: [id number] Date of Birth: 04-19-2000      Visit Date: 10/30/2022 1:52 PM  Age: 22 Years  Performing MD: Gasper Sells, MD   Ref Provider: [name], MD  Tech: Nelta Numbers   Room: 2   Height: 5 feet 5 inch  Reason for referral: This 22 year old woman who carries a diagnosis of [name] is being evaluated for lower extremity rigidity, spasms, and weakness following an outpatient surgical procedure.  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    Sensory NCS      Nerve / Sites Rec. Site Peak Lat Ref. PP Amp Ref. Distance Vel.     ms ms ??V ??V cm m/s   L Median - Dig II (Antidromic)      Wrist Index 2.65 ?3.80 88.2 ?10.0 14 71   L Ulnar - Dig V (Antidromic)      Wrist Dig V 2.75 ?3.80 57.6 ?10.0 14 65   L Radial - Superficial (Antidromic)      Forearm Wrist 2.17 ?2.80 52.2  10 62   L Sural - (Antidromic)      Calf Ankle 4.02 ?4.20 25.1 ?5.0 14 43   L SUP FIBULAR (PERON) - Ankle      Lat leg Ankle 3.17 ?3.40 12.3 ?5.0 10 39       Motor NCS      Nerve / Sites Muscle Latency Ref. Amplitude Ref. Dur. Distance Lat Diff Velocity Ref.     ms ms mV mV ms cm ms m/s m/s   L Median - APB      Wrist APB 2.69 ?4.40 11.2 ?4.2 6.29 8         Elbow APB 6.65  11.0  6.42 21 3.96 53.1 ?49.0   L Ulnar - ADM      Wrist ADM 2.58 ?3.70 7.9 ?5.6 5.92 8         B.Elbow ADM 5.67  7.0  6.00 17 3.08 55.1 ?49.0      A.Elbow ADM 7.48  6.1  6.75 10 1.81 55.2 ?49.0   L Ulnar - ADM - Pincus Badder      Wrist ADM 2.00 ?18.10 7.8 ?5.6 5.56 80         Post Ex. - 10 Sec ADM 2.00  8.6  5.38  0.00  ?49.0   L COMMON FIBULAR (PERON) - EDB      Ankle EDB 3.54 ?5.47 10.7 ?2.2 5.48 7.5         B. Fib Head EDB 9.58  10.1  5.90 30 6.04 49.7 ?41.0      A. Fib Head EDB 11.40  10.0  6.17 10 1.81 55.2 ?41.0   L Tibial - AH      Ankle AH 3.77 ?5.58 18.1 ?2.8 5.54 7.5         Knee AH 12.10  15.3  6.13 38 8.33 45.6 ?41.0       F  Wave      Nerve F min Ref.    ms ms   L COMMON FIBULAR (PERON) - EDB 44.0 ?56.0   L Tibial - AH 47.0 ?56.0   L Median - APB 24.9 ?31.0   L Ulnar - ADM 26.5 ?31.0  Repetitive Nerve Stimulation (RNS)      Anatomy / Train Rate Amplitude ? Amp 4-1 Fac Ampl Area ?Area 4-1 Fac Area    Hz mV % % mVms % %   L Ulnar - ADM   Baseline 2 9.3 -1 100 27.0 -4.3 100   Post 1 min Exercise 2 7.9 2.3 84.7 24.5 -7.2 90.6       EMG Summary Table     Spontaneous MUP Recruitment Comment   Muscle Nerve Roots Fib PSW Fasc Other Amp Dur. PPP Pattern Other   R. Tibialis anterior Deep peroneal (Fibular) L4-L5   None None N N N Normal None   R. Gastrocnemius (Medial head) Tibial S1-S2 None None None None N N N Normal None   L. Vastus medialis Femoral L2-L4 None None None None N N N Normal None   L. Iliopsoas Femoral L2-L3 None None None None N N N Normal None   L. Gastrocnemius (Lateral head) Tibial S1-S2 None None None None N N N Normal None       Summary:  After identifying the patient in the waiting room and reviewing all appropriately available medical records, the patient was taken back to the examination room where the procedure was explained, the sites of examination were noted, the patient's questions were answered, and the patient's verbal consent for the procedure was obtained. Studies were performed at [facility] using a radiant warmer and Synergy EMG system.      Sensory nerve conduction study in the left arm and left leg is normal.    Extensive motor nerve conduction study in the left arm and left leg is also normal with preserved amplitudes.  Lambert-Eaton screen with 10 seconds of exercise did not produce a significant incremental in response.  Repetitive nerve stimulation study at baseline and after exercise was also normal.    Needle EMG is normal.    Conclusions:  This is a normal study.          - - - - - - - - - - - - -   Resident or Fellow    As the attending physician, my signature affirms that I personally participated in the clinical assessment of this patient, performed or reviewed all electrodiagnostic procedures, and prepared or reviewed the conclusions of this report.      - - - - - - - - - - - -  Attending Electromyographer    Gasper Sells, MD   Carmon Ginsberg. Distinguished Professor  Stage manager, Division of Neuromuscular Medicine

## 2022-10-31 NOTE — Unmapped (Signed)
Acute Inpatient Rehab referral has been received and patient is currently under review.       Please be advised, [facility] is now located at the [facility].      Visitation Policy:  [facility] allows visitors (6 AM- 9 PM) and one visitor may be designated to stay overnight. The overnight visitor must be in the facility prior to 9 PM.       Donna Christen MS, PT  [facility]  Inpatient Coordinator   Office [phone]   11:57 AM 10/31/2022       Admission Criteria for Acute Inpatient Rehab:     The patient has medical need that will require daily physician oversight.  The patient requires at least two therapy disciplines (PT, OT, ST) at a high frequency.  It is anticipated that the patient will make significant functional improvement in a reasonable length of time.   The patient is able to participate in and tolerate a minimum of three hours of therapy per day.   The patient or his/her representative consents to the admission.  The patient/family has realistic goals that include discharge to a community setting (other than SNF) and has adequate assistance at home.

## 2022-10-31 NOTE — Unmapped (Signed)
Care Management  Initial Transition Planning Assessment              General  Care Manager assessed the patient by : In person interview with patient  Orientation Level: Oriented X4  Functional level prior to admission: Independent  Reason for referral: Discharge Planning    Contact/Decision Maker  Extended Emergency Contact Information  Primary Emergency Contact: Woehrle,[name]   [place]  Home Phone: [phone]  Mobile Phone: [phone]  Relation: Mother  Secondary Emergency Contact: Meara,Jeff  Mobile Phone: [phone]  Relation: Father    Legal Next of Kin / Guardian / POA / Advance Directives     HCDM ([place] policy, no known patient preference): Bice,[name] - Mother - [phone]    HCDM, back-up (If primary HCDM is unavailable): [name] - Father - [phone]    Advance Directive (Medical Treatment)  Does patient have an advance directive covering medical treatment?: Patient does not have advance directive covering medical treatment.  Reason patient does not have an advance directive covering medical treatment:: Patient does not wish to complete one at this time.    Health Care Decision Maker [HCDM] (Medical & Mental Health Treatment)  Healthcare Decision Maker: HCDM documented in the HCDM/Contact Info section.  Information offered on HCDM, Medical & Mental Health advance directives:: Patient declined information.         Readmission Information    Have you been hospitalized in the last 30 days?: Yes              Did the following happen with your discharge?         Patient Information  Lives with: Parent    Type of Residence: Private residence        Location/Detail: [address] [place] [address]    Support Systems/Concerns: Parent, Friends/Neighbors    Responsibilities/Dependents at home?: No    Home Care services in place prior to admission?: Yes  Type of Home Care services in place prior to admission: Home PT, DME or oxygen, Home infusion  Current Home Care provider (Name/Phone #): [facility]            Equipment Currently Used at Home: feeding device, nutrition supplies  Current HME Agency (Name/Phone #): [facility]    Currently receiving outpatient dialysis?: No       Financial Information       Need for financial assistance?: No       Social Determinants of Health  Social Determinants of Health     Financial Resource Strain: Low Risk  (09/26/2022)    Overall Financial Resource Strain (CARDIA)     Difficulty of Paying Living Expenses: Not very hard   Internet Connectivity: Not on file   Food Insecurity: No Food Insecurity (09/26/2022)    Hunger Vital Sign     Worried About Running Out of Food in the Last Year: Never true     Ran Out of Food in the Last Year: Never true   Tobacco Use: Low Risk  (10/10/2022)    Patient History     Smoking Tobacco Use: Never     Smokeless Tobacco Use: Never     Passive Exposure: Past   Housing/Utilities: Low Risk  (09/26/2022)    Housing/Utilities     Within the past 12 months, have you ever stayed: outside, in a car, in a tent, in an overnight shelter, or temporarily in someone else's home (i.e. couch-surfing)?: No     Are you worried  about losing your housing?: No     Within the past 12 months, have you been unable to get utilities (heat, electricity) when it was really needed?: No   Alcohol Use: Not on file   Transportation Needs: No Transportation Needs (09/26/2022)    PRAPARE - Transportation     Lack of Transportation (Medical): No     Lack of Transportation (Non-Medical): No   Substance Use: Not on file   Health Literacy: Not on file   Physical Activity: Not on file   Interpersonal Safety: Unknown (10/31/2022)    Interpersonal Safety     Unsafe Where You Currently Live: Not on file     Physically Hurt by Anyone: Not on file     Abused by Anyone: Not on file   Stress: Not on file   Intimate Partner Violence: Not on file   Depression: Not at risk (01/25/2022)    Received from [facility]    PHQ-2   Social Connections: Not on file       Complex Discharge Information    Is patient identified as a difficult/complex discharge?: No                    Interventions:       Discharge Needs Assessment  Concerns to be Addressed: care coordination/care conferences, discharge planning    Clinical Risk Factors: Multiple Diagnoses (Chronic), New Diagnosis, Readmission < 30 Days    Barriers to taking medications: No    Prior overnight hospital stay or ED visit in last 90 days: Yes              Anticipated Changes Related to Illness: none    Equipment Needed After Discharge: other (see comments) (CM will follow for DME needs)    Discharge Facility/Level of Care Needs: other (see comments) (CM will follow for discharge needs)    Readmission  Risk of Unplanned Readmission Score: UNPLANNED READMISSION SCORE: 38.91%  Predictive Model Details          39% (High)  Factor Value    Calculated 10/31/2022 08:03 24% Number of active inpatient medication orders 54    [place] Risk of Unplanned Readmission Model 19% Number of hospitalizations in last year 6     19% Current length of stay More than 30 days     6% ECG/EKG order present in last 6 months     5% Encounter of ten days or longer in last year present     5% Diagnosis of electrolyte disorder present     4% Imaging order present in last 6 months     4% Latest hemoglobin low (10.6 g/dL)     4% Number of ED visits in last six months 1     3% Diagnosis of deficiency anemia present     3% Active anticoagulant inpatient medication order present     3% Active corticosteroid inpatient medication order present     1% Future appointment scheduled     1% Age 22     1% Active ulcer inpatient medication order present      Readmitted Within the Last 30 Days? (No if blank)   Patient at risk for readmission?: Yes    Discharge Plan  Screen findings are: Care Manager reviewed the plan of the patient's care with the Multidisciplinary Team. No discharge planning needs identified at this time. Care Manager will continue to manage plan and monitor patient's progress with the team.    Expected Discharge Date:  11/04/2022    Expected Transfer from Critical Care: 10/29/22       Patient and/or family were provided with choice of facilities / services that are available and appropriate to meet post hospital care needs?: Yes       Initial Assessment complete?: Yes

## 2022-11-01 LAB — COMPREHENSIVE METABOLIC PANEL
ALBUMIN: 3.1 g/dL — ABNORMAL LOW (ref 3.4–5.0)
ALKALINE PHOSPHATASE: 164 U/L — ABNORMAL HIGH (ref 46–116)
ALT (SGPT): 34 U/L (ref 10–49)
ANION GAP: 5 mmol/L (ref 5–14)
AST (SGOT): 18 U/L (ref <=34)
BILIRUBIN TOTAL: 0.2 mg/dL — ABNORMAL LOW (ref 0.3–1.2)
BLOOD UREA NITROGEN: 8 mg/dL — ABNORMAL LOW (ref 9–23)
BUN / CREAT RATIO: 17
CALCIUM: 9.3 mg/dL (ref 8.7–10.4)
CHLORIDE: 107 mmol/L (ref 98–107)
CO2: 31 mmol/L (ref 20.0–31.0)
CREATININE: 0.46 mg/dL — ABNORMAL LOW
EGFR CKD-EPI (2021) FEMALE: >90 mL/min/{1.73_m2} (ref >=60)
GLUCOSE RANDOM: 116 mg/dL (ref 70–179)
POTASSIUM: 3.8 mmol/L (ref 3.4–4.8)
PROTEIN TOTAL: 6.1 g/dL (ref 5.7–8.2)
SODIUM: 143 mmol/L (ref 135–145)

## 2022-11-01 LAB — CBC
HEMATOCRIT: 30 % — ABNORMAL LOW (ref 34.0–44.0)
HEMOGLOBIN: 9.9 g/dL — ABNORMAL LOW (ref 11.3–14.9)
MEAN CORPUSCULAR HEMOGLOBIN CONC: 33 g/dL (ref 32.0–36.0)
MEAN CORPUSCULAR HEMOGLOBIN: 27.2 pg (ref 25.9–32.4)
MEAN CORPUSCULAR VOLUME: 82.4 fL (ref 77.6–95.7)
MEAN PLATELET VOLUME: 10.4 fL (ref 6.8–10.7)
PLATELET COUNT: 228 10*9/L (ref 150–450)
RED BLOOD CELL COUNT: 3.64 10*12/L — ABNORMAL LOW (ref 3.95–5.13)
RED CELL DISTRIBUTION WIDTH: 16.5 % — ABNORMAL HIGH (ref 12.2–15.2)
WBC ADJUSTED: 5.6 10*9/L (ref 3.6–11.2)

## 2022-11-01 MED ADMIN — carboxymethylcellulose sodium (REFRESH CELLUVISC) 1 % ophthalmic gel 1 drop: 1 [drp] | OPHTHALMIC | @ 10:00:00

## 2022-11-01 MED ADMIN — carboxymethylcellulose sodium (REFRESH CELLUVISC) 1 % ophthalmic gel 1 drop: 1 [drp] | OPHTHALMIC | @ 02:00:00

## 2022-11-01 MED ADMIN — ceFAZolin (ANCEF) IVPB 2 g in 50 ml dextrose (premix): 2 g | INTRAVENOUS | @ 10:00:00 | Stop: 2022-11-08

## 2022-11-01 MED ADMIN — promethazine (PHENERGAN) 6.5 mg in sodium chloride (NS) 0.9 % 50 mL IVPB: 6.5 mg | INTRAVENOUS | @ 01:00:00

## 2022-11-01 MED ADMIN — ceFAZolin (ANCEF) IVPB 2 g in 50 ml dextrose (premix): 2 g | INTRAVENOUS | @ 21:00:00 | Stop: 2022-11-08

## 2022-11-01 MED ADMIN — ondansetron (ZOFRAN) injection 4 mg: 4 mg | INTRAVENOUS | @ 13:00:00

## 2022-11-01 MED ADMIN — buprenorphine-naloxone (SUBOXONE) 2-0.5 mg SL film 0.5 mg of buprenorphine: .25 | SUBLINGUAL | @ 02:00:00

## 2022-11-01 MED ADMIN — sodium chloride (NS) 0.9 % infusion: 75 mL/h | INTRAVENOUS | @ 06:00:00 | Stop: 2022-11-01

## 2022-11-01 MED ADMIN — fluconazole (DIFLUCAN) tablet 400 mg: 400 mg | ORAL | @ 13:00:00 | Stop: 2022-11-01

## 2022-11-01 MED ADMIN — carboxymethylcellulose sodium (REFRESH CELLUVISC) 1 % ophthalmic gel 1 drop: 1 [drp] | OPHTHALMIC | @ 21:00:00

## 2022-11-01 MED ADMIN — baclofen (LIORESAL) tablet 20 mg: 20 mg | ORAL | @ 01:00:00

## 2022-11-01 MED ADMIN — diclofenac sodium (VOLTAREN) 1 % gel 2 g: 2 g | TOPICAL | @ 10:00:00

## 2022-11-01 MED ADMIN — HYDROmorphone (PF) (DILAUDID) injection 0.5 mg: .5 mg | INTRAVENOUS | @ 10:00:00 | Stop: 2022-11-04

## 2022-11-01 MED ADMIN — HYDROmorphone (PF) (DILAUDID) injection 0.5 mg: .5 mg | INTRAVENOUS | @ 16:00:00 | Stop: 2022-11-04

## 2022-11-01 MED ADMIN — baclofen (LIORESAL) tablet 20 mg: 20 mg | ORAL | @ 13:00:00

## 2022-11-01 MED ADMIN — HYDROmorphone (PF) (DILAUDID) injection 0.5 mg: .5 mg | INTRAVENOUS | @ 01:00:00 | Stop: 2022-11-04

## 2022-11-01 MED ADMIN — naloxegol (MOVANTIK) 12.5 mg tablet 12.5 mg: 12.5 mg | ORAL | @ 13:00:00

## 2022-11-01 MED ADMIN — diclofenac sodium (VOLTAREN) 1 % gel 2 g: 2 g | TOPICAL | @ 16:00:00

## 2022-11-01 MED ADMIN — famotidine (PEPCID) tablet 20 mg: 20 mg | ORAL | @ 01:00:00

## 2022-11-01 MED ADMIN — diclofenac sodium (VOLTAREN) 1 % gel 2 g: 2 g | TOPICAL | @ 21:00:00

## 2022-11-01 MED ADMIN — cetirizine (ZYRTEC) tablet 10 mg: 10 mg | GASTROENTERAL | @ 13:00:00

## 2022-11-01 MED ADMIN — HYDROmorphone (PF) (DILAUDID) injection 0.5 mg: .5 mg | INTRAVENOUS | @ 13:00:00 | Stop: 2022-11-04

## 2022-11-01 MED ADMIN — buprenorphine-naloxone (SUBOXONE) 2-0.5 mg SL film 0.5 mg of buprenorphine: .25 | SUBLINGUAL | @ 13:00:00

## 2022-11-01 MED ADMIN — HYDROmorphone (PF) (DILAUDID) injection 0.5 mg: .5 mg | INTRAVENOUS | Stop: 2022-11-04

## 2022-11-01 MED ADMIN — carboxymethylcellulose sodium (REFRESH CELLUVISC) 1 % ophthalmic gel 1 drop: 1 [drp] | OPHTHALMIC | @ 16:00:00

## 2022-11-01 MED ADMIN — ceFAZolin (ANCEF) IVPB 2 g in 50 ml dextrose (premix): 2 g | INTRAVENOUS | @ 02:00:00 | Stop: 2022-11-08

## 2022-11-01 MED ADMIN — diazePAM (VALIUM) tablet 5 mg: 5 mg | ORAL | @ 10:00:00

## 2022-11-01 MED ADMIN — carboxymethylcellulose sodium (REFRESH CELLUVISC) 1 % ophthalmic gel 2 drop: 2 [drp] | OPHTHALMIC | @ 21:00:00

## 2022-11-01 MED ADMIN — gabapentin (NEURONTIN) capsule 100 mg: 100 mg | ORAL | @ 13:00:00

## 2022-11-01 MED ADMIN — mirtazapine (REMERON) tablet 15 mg: 15 mg | GASTROENTERAL | @ 03:00:00

## 2022-11-01 MED ADMIN — promethazine (PHENERGAN) 6.5 mg in sodium chloride (NS) 0.9 % 50 mL IVPB: 6.5 mg | INTRAVENOUS | @ 20:00:00

## 2022-11-01 MED ADMIN — gabapentin (NEURONTIN) capsule 100 mg: 100 mg | ORAL | @ 03:00:00

## 2022-11-01 MED ADMIN — gabapentin (NEURONTIN) capsule 100 mg: 100 mg | ORAL | @ 20:00:00

## 2022-11-01 MED ADMIN — HYDROmorphone (PF) (DILAUDID) injection 0.5 mg: .5 mg | INTRAVENOUS | @ 20:00:00 | Stop: 2022-11-04

## 2022-11-01 MED ADMIN — pantoprazole (Protonix) oral suspension: 40 mg | ORAL | @ 13:00:00

## 2022-11-01 MED ADMIN — diazePAM (VALIUM) tablet 5 mg: 5 mg | ORAL | @ 22:00:00

## 2022-11-01 MED ADMIN — baclofen (LIORESAL) tablet 20 mg: 20 mg | ORAL | @ 20:00:00

## 2022-11-01 MED ADMIN — pantoprazole (Protonix) oral suspension: 40 mg | ORAL | @ 01:00:00

## 2022-11-01 MED ADMIN — buprenorphine-naloxone (SUBOXONE) 2-0.5 mg SL film 0.5 mg of buprenorphine: .25 | SUBLINGUAL | @ 20:00:00

## 2022-11-01 NOTE — Unmapped (Signed)
Patient alert and oriented x 4.  Patient assist times 2 to bathroom.  Medications administered per order.  Tube feeding running at 22ml/hr, not at goal due to patient complaining of nausea.  IV fluids running per order.  VSS.  Family at bedside.   Problem: Skin Injury Risk Increased  Goal: Skin Health and Integrity  Outcome: Ongoing - Unchanged     Problem: Self-Care Deficit  Goal: Improved Ability to Complete Activities of Daily Living  Outcome: Ongoing - Unchanged     Problem: Adult Inpatient Plan of Care  Goal: Optimal Comfort and Wellbeing  Outcome: Ongoing - Unchanged

## 2022-11-01 NOTE — Unmapped (Signed)
Problem: Adult Inpatient Plan of Care  Goal: Plan of Care Review  Outcome: Progressing  Goal: Patient-Specific Goal (Individualized)  Outcome: Progressing  Goal: Absence of Hospital-Acquired Illness or Injury  Outcome: Progressing  Intervention: Identify and Manage Fall Risk  Recent Flowsheet Documentation  Taken 11/01/2022 0800 by Judith Part, RN  Safety Interventions:   fall reduction program maintained   aspiration precautions   family at bedside  Intervention: Prevent Skin Injury  Recent Flowsheet Documentation  Taken 11/01/2022 0800 by Judith Part, RN  Positioning for Skin: Supine/Back  Device Skin Pressure Protection: adhesive use limited  Skin Protection: adhesive use limited  Intervention: Prevent and Manage VTE (Venous Thromboembolism) Risk  Recent Flowsheet Documentation  Taken 11/01/2022 1200 by Judith Part, RN  Anti-Embolism Device Type: SCD, Knee  Anti-Embolism Intervention: On  Anti-Embolism Device Location: BLE  Taken 11/01/2022 1000 by Judith Part, RN  Anti-Embolism Device Type: SCD, Knee  Anti-Embolism Intervention: On  Anti-Embolism Device Location: BLE  Taken 11/01/2022 0800 by Judith Part, RN  Anti-Embolism Device Type: SCD, Knee  Anti-Embolism Intervention: On  Anti-Embolism Device Location: BLE  Intervention: Prevent Infection  Recent Flowsheet Documentation  Taken 11/01/2022 0800 by Judith Part, RN  Infection Prevention: cohorting utilized  Goal: Optimal Comfort and Wellbeing  Outcome: Progressing  Goal: Readiness for Transition of Care  Outcome: Progressing  Goal: Rounds/Family Conference  Outcome: Progressing     Problem: Latex Allergy  Goal: Absence of Allergy Symptoms  Outcome: Progressing     Problem: Fall Injury Risk  Goal: Absence of Fall and Fall-Related Injury  Outcome: Progressing  Intervention: Promote Injury-Free Environment  Recent Flowsheet Documentation  Taken 11/01/2022 0800 by Judith Part, RN  Safety Interventions:   fall reduction program maintained aspiration precautions   family at bedside     Problem: Skin Injury Risk Increased  Goal: Skin Health and Integrity  Outcome: Progressing  Intervention: Optimize Skin Protection  Recent Flowsheet Documentation  Taken 11/01/2022 0800 by Judith Part, RN  Pressure Reduction Techniques: frequent weight shift encouraged  Pressure Reduction Devices: pressure-redistributing mattress utilized  Skin Protection: adhesive use limited     Problem: Self-Care Deficit  Goal: Improved Ability to Complete Activities of Daily Living  Outcome: Progressing     Problem: Comorbidity Management  Goal: Blood Glucose Levels Within Targeted Range  Outcome: Progressing  Goal: Blood Pressure in Desired Range  Outcome: Progressing     Problem: Nausea and Vomiting  Goal: Nausea and Vomiting Relief  Outcome: Progressing     Problem: Infection  Goal: Absence of Infection Signs and Symptoms  Outcome: Progressing  Intervention: Prevent or Manage Infection  Recent Flowsheet Documentation  Taken 11/01/2022 0800 by Judith Part, RN  Infection Management: aseptic technique maintained     Problem: Pain Acute  Goal: Optimal Pain Control and Function  Outcome: Progressing     Problem: Malnutrition  Goal: Improved Nutritional Intake  Outcome: Progressing    0700-1300. Pt A&Ox4, VSS on 2L . SR on tele, ST with exertion. Pain controlled with PRN dilaudid. Regular diet, poor PO intake. Corpak, tube feeds going at 70ml/h. R neck CVC, dressing CDI. Voiding via BSC. MAE, generalized weakness. Up with 1 assist. Pt going off unit in [facility] with mom.

## 2022-11-01 NOTE — Unmapped (Signed)
Hospitalist Daily Progress Note     LOS: 35 days under inpatient status    Assessment/Plan:  Principal Problem:    Other acute postprocedural pain  Active Problems:    [name]    Intestinal polyps    [name]    Neoplasm related pain    Physical deconditioning    History of colectomy    Intractable abdominal pain    Generalized anxiety disorder with panic attacks    SBO (small bowel obstruction) (CMS-HCC)    Small intestinal bacterial overgrowth (SIBO)         *Enterobacteriaceae group bacteremia, C albicans fungemia.  Likely due to RIJ tunneled line placed 01/2022 now removed.  8/13, 8/17 repeat blood cultures NGTD.  No evidence of vitreitis, retinitis 8/15 per Ophtho.  TTE 8/15 w possible vegetations on MV and TV or indwelling catheter; however, TEE 8/20 w/o vegetations.  (Cefepime 8/13-8/20, metronidazole 8/13-8/16, micafungin 8/14-8/22)  -Continue cefazolin (started 8/20), fluconazole (IV given difficulty tolerating PO form) through 8/31.  -Check CBC w diff weekly, CMP daily while on above.     *[name], desmoid fibromatosis.  H/O proctocolectomy, IPAA.  Follows w [facility].  S/P planned triamcinolone/bupivacaine injection of 2 RUE desmoid tumors, L chest tumor by IR 7/18, complicated by post-op pain which was initial reason for this stay.  -Holding home nirogacestat for now given acute illness, LFT elevation but now that appears to be improving, will look into restarting at 50mg  BID as before if will help w current GI issues.  Will discuss w Onc.     *Constipation, nausea.  Ileus resolved, needed NG decompression earlier this stay w TPN 7/21-8/13.  Baseline necessary opioid use.  H/O TPN dependence.  -Continue tube feeds though probably needs closer to H2O q3h instead of 50ml q3h.  -Continue bisacodyl PR BID (noted that she is declining this at times).  -Continue senna, PEG.  -Continue naloxegol.  -Allowing IV promethazine PRN.     *Generalized anxiety disorder, depression, h/o panic attacks.  -Continue home mirtazapine.  -Continue home PRN lorazepam.  -Appreciate Psych???s assistance.     *Chronic abd pain.  Follows w [facility].  Typically on oxycodone 10mg  PRN at home.  Has successfully had ketamine and lidocaine infusions for pain.  S/P lidocaine 7/21-7/24, ketamine 8/1-8/9.  -Continue home buprenorphine-naloxone 0.5-0.125 TID.  -Allowing hydromorphone 0.5mg  q3h PRN IV for now.  -Continue gabapentin.     *Muscle spasm, stiffness, deconditioning.  MRI C, T, L-spine and MRI brain all w and w/o contrast unremarkable.  7/20 paraneoplastic Ab panel w slightly elevated P/Q-type Ca channel Ab which can be seen w Lambert-Eaton.  However, EMG NL.  -F/U autoimmune myelopathy panel sent 8/18.  -Continue baclofen.  -Gabapentin as above.  -Continue PT/OT.  -Continue diazepam given possible drug-drug interaction between lorazepam and fluconazole.  -Appreciate Neuro's assistance.     *Transaminase elevation.  Improved.  Liver U/S w Doppler 8/15 unremarkable.  ?2/2 TPN or nirogacestat.  -Holding nirogacestat as above.  -Monitor.     *Oral candidiasis.     *Fe def anemia.  S/P 1.02g IV Fe (ferumoxytol).  H/O FE hypersensitivity reactions and evaluated by [facility].     *SIBO.  Typically on cefdinir MWF, doxy TTSS at home.     *Coffee-ground emesis earlier this stay likely 2/2 NGT.  -Continue PPI BID for now.     *H/O DVT.     *DVT prophylaxis.  -Continue enoxaparin (noted that she is declining this at  times).     *Dispo.  Pending above.  AIR referrals sent.  RIJ central line in place (placed 8/14).  Has gone home with CorPak before.           Please page the [facility] at [phone] with questions.          Consultants:   1. Neuro   2. Chronic Pain (off)  3. GI (off)  4. Psych  5. PM&R  6. Lower GI Surg (off)  7. ID (off)  8. Ophtho (off)        Pending labs:   Pending Labs       Order Current Status    Myelopathy, Autoimmune/Paraneoplastic Evaluation, Serum In process              Subjective: Promethazine x 2, ondansetron x 1, lorazepam x 1, hydromorphone x 4,  in last 24 hrs.  Going outside to see sister and therapy dog which she is excited about.  Did get some sleep last night.  Still feels a little stiff but did walk this AM to bathroom.      Objective:   Physical Exam:  General: Sitting comfortably in  wheelchair , no acute distress.   Eyes: No conjunctival injection or icterus.    ENT: Mucous membranes moist.  Normal appearing ears, nose.   Neck: Midline trachea.     Respiratory: Normal respiratory effort.    Cardiovascular: No lower extremity edema.   Gastrointestinal: Bowel sounds present, soft.   Skin: No obvious rashes or breakdown.  Warm, dry.   Musculoskeletal: Motor strength in all extremities grossly intact.   Psychiatric: Alert.  Answers questions appropriately.  Judgment and insight intact.   Neurologic: Extraocular movements intact, no facial asymmetry.  No gross sensory deficits.       Vital signs in last 24 hours:  Temp:  [36.6 ??C (97.9 ??F)-37 ??C (98.6 ??F)] 36.6 ??C (97.9 ??F)  Heart Rate:  [68-153] 98  Resp:  [18] 18  BP: (105-113)/(62-72) 105/64  MAP (mmHg):  [73-84] 76  SpO2:  [99 %-100 %] 100 %    Intake/Output last 24 hours:    Intake/Output Summary (Last 24 hours) at 11/01/2022 0643  Last data filed at 11/01/2022 0000  Gross per 24 hour   Intake --   Output 2550 ml   Net -2550 ml       Most recent recorded weights:  Last 5 Recorded Weights    09/29/22 1300 10/06/22 1021 10/09/22 1730 10/11/22 0530   Weight: 62.1 kg (137 lb) 60.8 kg (134 lb 1.6 oz) 61 kg (134 lb 6.4 oz) 60.2 kg (132 lb 11.5 oz)    10/26/22 1730   Weight: 54 kg (119 lb)       Medications:   Scheduled Meds:   baclofen  20 mg Oral TID    bisacodyl  10 mg Rectal BID    buprenorphine-naloxone  0.25 Film Sublingual TID    carboxymethylcellulose sodium  1 drop Both Eyes QID    ceFAZolin  2 g Intravenous Q8H    cetirizine  10 mg Enteral tube: gastric Daily    diazePAM  5 mg Oral TID    diclofenac sodium  2 g Topical QID enoxaparin (LOVENOX) injection  40 mg Subcutaneous Q24H SCH    famotidine  20 mg Oral Nightly    fluconazole  400 mg Oral Daily    gabapentin  100 mg Oral TID    mirtazapine  15 mg Enteral tube: gastric Nightly  naloxegol  12.5 mg Oral Daily    [Provider Hold] nirogacestat  50 mg Oral BID    pantoprazole  40 mg Oral BID     Continuous Infusions:   Chemo Clarification Order      IP okay to treat      sodium chloride      sodium chloride 75 mL/hr (11/01/22 0225)         ADMINISTRATIVE  50  minutes total spent on this encounter, including preparing to see the patient, seeing and examining the patient, counseling the patient, counseling family, placing orders, discussing the patient with other providers, documenting the encounter, and coordinating care.

## 2022-11-02 LAB — COMPREHENSIVE METABOLIC PANEL
ALBUMIN: 3.4 g/dL (ref 3.4–5.0)
ALKALINE PHOSPHATASE: 162 U/L — ABNORMAL HIGH (ref 46–116)
ALT (SGPT): 28 U/L (ref 10–49)
ANION GAP: 5 mmol/L (ref 5–14)
AST (SGOT): 21 U/L (ref <=34)
BILIRUBIN TOTAL: 0.2 mg/dL — ABNORMAL LOW (ref 0.3–1.2)
BLOOD UREA NITROGEN: 10 mg/dL (ref 9–23)
BUN / CREAT RATIO: 20
CALCIUM: 9.6 mg/dL (ref 8.7–10.4)
CHLORIDE: 104 mmol/L (ref 98–107)
CO2: 32 mmol/L — ABNORMAL HIGH (ref 20.0–31.0)
CREATININE: 0.5 mg/dL — ABNORMAL LOW
EGFR CKD-EPI (2021) FEMALE: >90 mL/min/{1.73_m2} (ref >=60)
GLUCOSE RANDOM: 111 mg/dL (ref 70–179)
POTASSIUM: 3.9 mmol/L (ref 3.4–4.8)
PROTEIN TOTAL: 6.4 g/dL (ref 5.7–8.2)
SODIUM: 141 mmol/L (ref 135–145)

## 2022-11-02 MED ADMIN — gabapentin (NEURONTIN) capsule 100 mg: 100 mg | ORAL | @ 14:00:00

## 2022-11-02 MED ADMIN — carboxymethylcellulose sodium (REFRESH CELLUVISC) 1 % ophthalmic gel 1 drop: 1 [drp] | OPHTHALMIC | @ 22:00:00

## 2022-11-02 MED ADMIN — ceFAZolin (ANCEF) IVPB 2 g in 50 ml dextrose (premix): 2 g | INTRAVENOUS | @ 05:00:00 | Stop: 2022-11-09

## 2022-11-02 MED ADMIN — buprenorphine-naloxone (SUBOXONE) 2-0.5 mg SL film 0.5 mg of buprenorphine: .25 | SUBLINGUAL | @ 03:00:00

## 2022-11-02 MED ADMIN — diclofenac sodium (VOLTAREN) 1 % gel 2 g: 2 g | TOPICAL | @ 03:00:00

## 2022-11-02 MED ADMIN — diazePAM (VALIUM) tablet 5 mg: 5 mg | ORAL | @ 18:00:00

## 2022-11-02 MED ADMIN — gabapentin (NEURONTIN) capsule 100 mg: 100 mg | ORAL | @ 18:00:00

## 2022-11-02 MED ADMIN — carboxymethylcellulose sodium (REFRESH CELLUVISC) 1 % ophthalmic gel 1 drop: 1 [drp] | OPHTHALMIC | @ 03:00:00

## 2022-11-02 MED ADMIN — promethazine (PHENERGAN) 6.5 mg in sodium chloride (NS) 0.9 % 50 mL IVPB: 6.5 mg | INTRAVENOUS | @ 06:00:00

## 2022-11-02 MED ADMIN — promethazine (PHENERGAN) 6.5 mg in sodium chloride (NS) 0.9 % 50 mL IVPB: 6.5 mg | INTRAVENOUS | @ 18:00:00

## 2022-11-02 MED ADMIN — diclofenac sodium (VOLTAREN) 1 % gel 2 g: 2 g | TOPICAL | @ 18:00:00

## 2022-11-02 MED ADMIN — carboxymethylcellulose sodium (REFRESH CELLUVISC) 1 % ophthalmic gel 1 drop: 1 [drp] | OPHTHALMIC | @ 18:00:00

## 2022-11-02 MED ADMIN — diclofenac sodium (VOLTAREN) 1 % gel 2 g: 2 g | TOPICAL | @ 22:00:00

## 2022-11-02 MED ADMIN — HYDROmorphone (PF) (DILAUDID) injection 0.5 mg: .5 mg | INTRAVENOUS | @ 10:00:00 | Stop: 2022-11-08

## 2022-11-02 MED ADMIN — HYDROmorphone (PF) (DILAUDID) injection 0.5 mg: .5 mg | INTRAVENOUS | @ 03:00:00 | Stop: 2022-11-04

## 2022-11-02 MED ADMIN — gabapentin (NEURONTIN) capsule 100 mg: 100 mg | ORAL | @ 03:00:00

## 2022-11-02 MED ADMIN — baclofen (LIORESAL) tablet 20 mg: 20 mg | ORAL | @ 03:00:00

## 2022-11-02 MED ADMIN — HYDROmorphone (PF) (DILAUDID) injection 0.5 mg: .5 mg | INTRAVENOUS | @ 13:00:00 | Stop: 2022-11-08

## 2022-11-02 MED ADMIN — diazePAM (VALIUM) tablet 5 mg: 5 mg | ORAL | @ 10:00:00

## 2022-11-02 MED ADMIN — buprenorphine-naloxone (SUBOXONE) 2-0.5 mg SL film 0.5 mg of buprenorphine: .25 | SUBLINGUAL | @ 14:00:00

## 2022-11-02 MED ADMIN — naloxegol (MOVANTIK) 12.5 mg tablet 12.5 mg: 12.5 mg | ORAL | @ 14:00:00

## 2022-11-02 MED ADMIN — ondansetron (ZOFRAN) injection 4 mg: 4 mg | INTRAVENOUS | @ 22:00:00

## 2022-11-02 MED ADMIN — baclofen (LIORESAL) tablet 20 mg: 20 mg | ORAL | @ 14:00:00

## 2022-11-02 MED ADMIN — famotidine (PEPCID) tablet 20 mg: 20 mg | ORAL | @ 03:00:00

## 2022-11-02 MED ADMIN — cetirizine (ZYRTEC) tablet 10 mg: 10 mg | GASTROENTERAL | @ 14:00:00

## 2022-11-02 MED ADMIN — carboxymethylcellulose sodium (REFRESH CELLUVISC) 1 % ophthalmic gel 1 drop: 1 [drp] | OPHTHALMIC | @ 10:00:00

## 2022-11-02 MED ADMIN — buprenorphine-naloxone (SUBOXONE) 2-0.5 mg SL film 0.5 mg of buprenorphine: .25 | SUBLINGUAL | @ 18:00:00

## 2022-11-02 MED ADMIN — ceFAZolin (ANCEF) IVPB 2 g in 50 ml dextrose (premix): 2 g | INTRAVENOUS | @ 14:00:00 | Stop: 2022-11-09

## 2022-11-02 MED ADMIN — diazePAM (VALIUM) tablet 5 mg: 5 mg | ORAL | @ 22:00:00

## 2022-11-02 MED ADMIN — pantoprazole (Protonix) oral suspension: 40 mg | ORAL | @ 14:00:00

## 2022-11-02 MED ADMIN — HYDROmorphone (PF) (DILAUDID) injection 0.5 mg: .5 mg | INTRAVENOUS | @ 18:00:00 | Stop: 2022-11-08

## 2022-11-02 MED ADMIN — HYDROmorphone (PF) (DILAUDID) injection 0.5 mg: .5 mg | INTRAVENOUS | @ 22:00:00 | Stop: 2022-11-08

## 2022-11-02 MED ADMIN — fluconazole (DIFLUCAN) 2 mg/mL in sodium chloride 0.9% 400 mg: 400 mg | INTRAVENOUS | @ 14:00:00 | Stop: 2022-11-09

## 2022-11-02 MED ADMIN — polyethylene glycol (MIRALAX) packet 17 g: 17 g | ORAL | @ 03:00:00

## 2022-11-02 MED ADMIN — ondansetron (ZOFRAN) injection 4 mg: 4 mg | INTRAVENOUS | @ 03:00:00

## 2022-11-02 MED ADMIN — pantoprazole (Protonix) oral suspension: 40 mg | ORAL | @ 03:00:00

## 2022-11-02 MED ADMIN — diclofenac sodium (VOLTAREN) 1 % gel 2 g: 2 g | TOPICAL | @ 10:00:00

## 2022-11-02 MED ADMIN — baclofen (LIORESAL) tablet 20 mg: 20 mg | ORAL | @ 18:00:00

## 2022-11-02 MED ADMIN — senna (SENOKOT) tablet 2 tablet: 2 | ORAL | @ 03:00:00

## 2022-11-02 MED ADMIN — HYDROmorphone (PF) (DILAUDID) injection 0.5 mg: .5 mg | INTRAVENOUS | @ 06:00:00 | Stop: 2022-11-08

## 2022-11-02 MED ADMIN — mirtazapine (REMERON) tablet 15 mg: 15 mg | GASTROENTERAL | @ 03:00:00

## 2022-11-02 MED ADMIN — ceFAZolin (ANCEF) IVPB 2 g in 50 ml dextrose (premix): 2 g | INTRAVENOUS | @ 22:00:00 | Stop: 2022-11-09

## 2022-11-02 NOTE — Unmapped (Signed)
Patient alert and oriented x 4.  Patient standby assist to bathroom.  Tube feeding running per order.  Medications administered per order.  VSS.  Family at bedside.    Problem: Adult Inpatient Plan of Care  Goal: Optimal Comfort and Wellbeing  Outcome: Ongoing - Unchanged     Problem: Self-Care Deficit  Goal: Improved Ability to Complete Activities of Daily Living  Outcome: Ongoing - Unchanged     Problem: Nausea and Vomiting  Goal: Nausea and Vomiting Relief  Outcome: Ongoing - Unchanged     Problem: Infection  Goal: Absence of Infection Signs and Symptoms  Outcome: Ongoing - Unchanged

## 2022-11-02 NOTE — Unmapped (Signed)
Pt VSS, no acute mental status changes this shift, pt is A&O x 4 and is up with one assist. Pt got IV hydromorphone 0.5 mg x 1 with this nurse, Pt still has feedings going and is on RA. Pt shows no S/S of ARDS at this time. Pt is still continuous heart and or monitored   Problem: Adult Inpatient Plan of Care  Goal: Plan of Care Review  Outcome: Ongoing - Unchanged  Goal: Patient-Specific Goal (Individualized)  Outcome: Ongoing - Unchanged  Goal: Absence of Hospital-Acquired Illness or Injury  Outcome: Ongoing - Unchanged  Goal: Optimal Comfort and Wellbeing  Outcome: Ongoing - Unchanged  Goal: Readiness for Transition of Care  Outcome: Ongoing - Unchanged  Goal: Rounds/Family Conference  Outcome: Ongoing - Unchanged     Problem: Latex Allergy  Goal: Absence of Allergy Symptoms  Outcome: Ongoing - Unchanged     Problem: Fall Injury Risk  Goal: Absence of Fall and Fall-Related Injury  Outcome: Ongoing - Unchanged     Problem: Skin Injury Risk Increased  Goal: Skin Health and Integrity  Outcome: Ongoing - Unchanged     Problem: Self-Care Deficit  Goal: Improved Ability to Complete Activities of Daily Living  Outcome: Ongoing - Unchanged     Problem: Comorbidity Management  Goal: Blood Glucose Levels Within Targeted Range  Outcome: Ongoing - Unchanged  Goal: Blood Pressure in Desired Range  Outcome: Ongoing - Unchanged     Problem: Nausea and Vomiting  Goal: Nausea and Vomiting Relief  Outcome: Ongoing - Unchanged     Problem: Infection  Goal: Absence of Infection Signs and Symptoms  Outcome: Ongoing - Unchanged     Problem: Pain Acute  Goal: Optimal Pain Control and Function  Outcome: Ongoing - Unchanged     Problem: Malnutrition  Goal: Improved Nutritional Intake  Outcome: Ongoing - Unchanged

## 2022-11-02 NOTE — Unmapped (Signed)
Hospitalist Daily Progress Note     LOS: 36 days under inpatient status    Assessment/Plan:  Principal Problem:    Other acute postprocedural pain  Active Problems:    [name]    Intestinal polyps    [name]    Neoplasm related pain    Physical deconditioning    History of colectomy    Intractable abdominal pain    Generalized anxiety disorder with panic attacks    SBO (small bowel obstruction) (CMS-HCC)    Small intestinal bacterial overgrowth (SIBO)         *Enterobacteriaceae group bacteremia, C albicans fungemia.  Resolving.  Likely due to RIJ tunneled line placed 01/2022 now removed.  8/13, 8/17 repeat blood cultures NGTD.  No evidence of vitreitis, retinitis 8/15 per Ophtho.  TTE 8/15 w possible vegetations on MV and TV or indwelling catheter; however, TEE 8/20 w/o vegetations.  (Cefepime 8/13-8/20, metronidazole 8/13-8/16, micafungin 8/14-8/22)  -Continue cefazolin (started 8/20), fluconazole (IV given difficulty tolerating PO form) through 8/31.  -Check CBC w diff weekly, CMP daily while on above.     *[name], desmoid fibromatosis.  H/O proctocolectomy, IPAA.  Follows w [facility].  S/P planned triamcinolone/bupivacaine injection of 2 RUE desmoid tumors, L chest tumor by IR 7/18, complicated by post-op pain which was initial reason for this stay.  -Holding home nirogacestat for now given acute illness, LFT elevation but now that appears to be improving, will look into restarting at 50mg  BID as before if will help w current GI issues.  Will try to reach her oncologist Dr. Theone Stanley.     *Nausea.  Improving but not resolved.  Ileus resolved, needed NG decompression earlier this stay w TPN 7/21-8/13.  Baseline necessary opioid use.  H/O TPN dependence.  -Continue ND tube feeds (Osmolite 1.5 49ml/h, free water flush 50ml q3h) though probably needs closer to H2O q3h instead of 50ml q3h.  She will try to take more water PO.  -Continue bisacodyl, PEG, senna PRN for now as is having stools spontaneously.  -Continue naloxegol.  -Allowing IV promethazine PRN.     *Generalized anxiety disorder, depression, h/o panic attacks.  -Continue home mirtazapine.  -Continue home PRN lorazepam.  -Appreciate Psych???s assistance.     *Chronic abd pain.  Follow[facility] w [facility].  Typically on oxycodone 10mg  PRN at home.  Has successfully had ketamine and lidocaine infusions for pain.  S/P lidocaine 7/21-7/24, ketamine 8/1-8/9.  -Continue home buprenorphine-naloxone 0.5-0.125 TID.  -Continue oxycodone 10-15mg  q4h PRN for now.  -Allowing hydromorphone 0.5mg  q3h PRN IV for now.  -Continue gabapentin.     *Muscle spasm, stiffness, deconditioning.  MRI C, T, L-spine and MRI brain all w and w/o contrast unremarkable.  7/20 paraneoplastic Ab panel w slightly elevated P/Q-type Ca channel Ab which can be seen w [facility].  However, EMG NL.  -F/U autoimmune myelopathy panel sent 8/18.  -Continue baclofen.  -Gabapentin as above.  -Continue PT/OT.  -Continue diazepam given possible drug-drug interaction between lorazepam and fluconazole.  -Appreciate Neuro's assistance.     *Transaminase elevation.  Improved.  Liver U/S w Doppler 8/15 unremarkable.  ?2/2 TPN or nirogacestat.  -Holding nirogacestat as above.  -Monitor.     *Oral candidiasis.     *Fe def anemia.  S/P 1.02g IV Fe (ferumoxytol).  H/O FE hypersensitivity reactions and evaluated by [facility].     *SIBO.  Typically on cefdinir MWF, doxy TTSS at home.     *Coffee-ground emesis earlier  this stay likely 2/2 NGT.  -Continue PPI BID for now.     *H/O DVT.     *DVT prophylaxis.  -Continue enoxaparin (noted that she is declining this at times).     *Dispo.  Pending above.  AIR referrals sent.  RIJ central line in place (placed 8/14).  Has gone home with [facility] before (12/2021).              Please page the [facility] at [phone] with questions.              Consultants:   1. Neuro   2. Chronic Pain (off)  3. GI (off)  4. Psych  5. PM&R  6. Lower GI Surg (off)  7. ID (off)  8. Ophtho (off)         Pending labs:   Pending Labs       Order Current Status    Myelopathy, Autoimmune/Paraneoplastic Evaluation, Serum In process              Subjective:   Senna x 1, promethazine x 2, PEG x 1, ondansetron x 2, hydromorphone x 6 in last 24 hrs.  Enjoyed spending part of day outside yesterday w family.  A little worn out from yesterday.  Trying to eat.      Objective:   Physical Exam:  General: Lying comfortably in bed, no acute distress.   Eyes: No conjunctival injection or icterus.     ENT: Mucous membranes moist.  Normal appearing ears, nose.   Neck: Midline trachea.  No obvious goiter.   Respiratory: Normal respiratory effort.     Cardiovascular: Regular rhythm w/o murmur.  Radial, DP pulses 2+ bilaterally.  No lower extremity edema.   Gastrointestinal: Bowel sounds present, soft, nondistended.   Skin: No obvious rashes or breakdown.  Warm, dry.   Musculoskeletal: Motor strength in all extremities grossly intact though w diffuse global weakness.   Psychiatric: Alert.  Answers questions appropriately.  Judgment and insight intact.   Neurologic: Extraocular movements intact, no facial asymmetry.        Vital signs in last 24 hours:  Temp:  [36.6 ??C (97.9 ??F)-36.9 ??C (98.4 ??F)] 36.7 ??C (98.1 ??F)  Heart Rate:  [69-99] 77  Resp:  [18-20] 18  BP: (102-124)/(54-85) 108/69  MAP (mmHg):  [68-97] 81  SpO2:  [96 %-100 %] 99 %    Intake/Output last 24 hours:    Intake/Output Summary (Last 24 hours) at 11/02/2022 0536  Last data filed at 11/01/2022 2315  Gross per 24 hour   Intake 1800 ml   Output 1200 ml   Net 600 ml       Most recent recorded weights:  Last 5 Recorded Weights    09/29/22 1300 10/06/22 1021 10/09/22 1730 10/11/22 0530   Weight: 62.1 kg (137 lb) 60.8 kg (134 lb 1.6 oz) 61 kg (134 lb 6.4 oz) 60.2 kg (132 lb 11.5 oz)    10/26/22 1730   Weight: 54 kg (119 lb)       Medications:   Scheduled Meds:   baclofen  20 mg Oral TID    bisacodyl  10 mg Rectal BID    buprenorphine-naloxone  0.25 Film Sublingual TID    carboxymethylcellulose sodium  1 drop Both Eyes QID    ceFAZolin  2 g Intravenous Q8H    cetirizine  10 mg Enteral tube: gastric Daily    diazePAM  5 mg Oral TID    diclofenac sodium  2 g  Topical QID    enoxaparin (LOVENOX) injection  40 mg Subcutaneous Q24H SCH    famotidine  20 mg Oral Nightly    fluconazole (DIFLUCAN) INTRAVENOUS  400 mg Intravenous Q24H    gabapentin  100 mg Oral TID    mirtazapine  15 mg Enteral tube: gastric Nightly    naloxegol  12.5 mg Oral Daily    [Provider Hold] nirogacestat  50 mg Oral BID    pantoprazole  40 mg Oral BID     Continuous Infusions:   Chemo Clarification Order      IP okay to treat      sodium chloride           ADMINISTRATIVE  50  minutes total spent on this encounter, including preparing to see the patient, seeing and examining the patient, counseling the patient, counseling family, placing orders, discussing the patient with other providers, documenting the encounter, and coordinating care.

## 2022-11-03 LAB — COMPREHENSIVE METABOLIC PANEL
ALBUMIN: 3.3 g/dL — ABNORMAL LOW (ref 3.4–5.0)
ALKALINE PHOSPHATASE: 154 U/L — ABNORMAL HIGH (ref 46–116)
ALT (SGPT): 37 U/L (ref 10–49)
ANION GAP: 5 mmol/L (ref 5–14)
AST (SGOT): 46 U/L — ABNORMAL HIGH (ref <=34)
BILIRUBIN TOTAL: 0.2 mg/dL — ABNORMAL LOW (ref 0.3–1.2)
BLOOD UREA NITROGEN: 9 mg/dL (ref 9–23)
BUN / CREAT RATIO: 21
CALCIUM: 9.7 mg/dL (ref 8.7–10.4)
CHLORIDE: 104 mmol/L (ref 98–107)
CO2: 31 mmol/L (ref 20.0–31.0)
CREATININE: 0.42 mg/dL — ABNORMAL LOW
EGFR CKD-EPI (2021) FEMALE: >90 mL/min/{1.73_m2} (ref >=60)
GLUCOSE RANDOM: 114 mg/dL (ref 70–179)
POTASSIUM: 4 mmol/L (ref 3.4–4.8)
PROTEIN TOTAL: 6.3 g/dL (ref 5.7–8.2)
SODIUM: 140 mmol/L (ref 135–145)

## 2022-11-03 MED ADMIN — baclofen (LIORESAL) tablet 20 mg: 20 mg | ORAL | @ 19:00:00 | Stop: 2022-11-03

## 2022-11-03 MED ADMIN — carboxymethylcellulose sodium (REFRESH CELLUVISC) 1 % ophthalmic gel 1 drop: 1 [drp] | OPHTHALMIC | @ 21:00:00

## 2022-11-03 MED ADMIN — naloxegol (MOVANTIK) 12.5 mg tablet 12.5 mg: 12.5 mg | ORAL | @ 14:00:00 | Stop: 2022-11-03

## 2022-11-03 MED ADMIN — promethazine (PHENERGAN) 6.5 mg in sodium chloride (NS) 0.9 % 50 mL IVPB: 6.5 mg | INTRAVENOUS | @ 02:00:00

## 2022-11-03 MED ADMIN — HYDROmorphone (PF) (DILAUDID) injection 0.5 mg: .5 mg | INTRAVENOUS | @ 22:00:00 | Stop: 2022-11-08

## 2022-11-03 MED ADMIN — diazePAM (VALIUM) tablet 5 mg: 5 mg | ORAL | @ 10:00:00 | Stop: 2022-11-03

## 2022-11-03 MED ADMIN — gabapentin (NEURONTIN) capsule 100 mg: 100 mg | ORAL | @ 03:00:00

## 2022-11-03 MED ADMIN — HYDROmorphone (PF) (DILAUDID) injection 0.5 mg: .5 mg | INTRAVENOUS | @ 11:00:00 | Stop: 2022-11-08

## 2022-11-03 MED ADMIN — ceFAZolin (ANCEF) IVPB 2 g in 50 ml dextrose (premix): 2 g | INTRAVENOUS | @ 04:00:00 | Stop: 2022-11-09

## 2022-11-03 MED ADMIN — gabapentin (NEURONTIN) capsule 100 mg: 100 mg | ORAL | @ 19:00:00

## 2022-11-03 MED ADMIN — carboxymethylcellulose sodium (REFRESH CELLUVISC) 1 % ophthalmic gel 1 drop: 1 [drp] | OPHTHALMIC | @ 10:00:00

## 2022-11-03 MED ADMIN — HYDROmorphone (PF) (DILAUDID) injection 0.5 mg: .5 mg | INTRAVENOUS | @ 14:00:00 | Stop: 2022-11-08

## 2022-11-03 MED ADMIN — HYDROmorphone (PF) (DILAUDID) injection 0.5 mg: .5 mg | INTRAVENOUS | @ 02:00:00 | Stop: 2022-11-08

## 2022-11-03 MED ADMIN — HYDROmorphone (PF) (DILAUDID) injection 0.5 mg: .5 mg | INTRAVENOUS | @ 19:00:00 | Stop: 2022-11-08

## 2022-11-03 MED ADMIN — buprenorphine-naloxone (SUBOXONE) 2-0.5 mg SL film 0.5 mg of buprenorphine: .25 | SUBLINGUAL | @ 19:00:00

## 2022-11-03 MED ADMIN — pantoprazole (Protonix) oral suspension: 40 mg | ORAL | @ 14:00:00 | Stop: 2022-11-03

## 2022-11-03 MED ADMIN — mirtazapine (REMERON) tablet 15 mg: 15 mg | GASTROENTERAL | @ 03:00:00

## 2022-11-03 MED ADMIN — diclofenac sodium (VOLTAREN) 1 % gel 2 g: 2 g | TOPICAL | @ 03:00:00

## 2022-11-03 MED ADMIN — diclofenac sodium (VOLTAREN) 1 % gel 2 g: 2 g | TOPICAL | @ 16:00:00

## 2022-11-03 MED ADMIN — carboxymethylcellulose sodium (REFRESH CELLUVISC) 1 % ophthalmic gel 1 drop: 1 [drp] | OPHTHALMIC | @ 16:00:00

## 2022-11-03 MED ADMIN — cetirizine (ZYRTEC) tablet 10 mg: 10 mg | GASTROENTERAL | @ 14:00:00 | Stop: 2022-11-03

## 2022-11-03 MED ADMIN — diazePAM (VALIUM) tablet 5 mg: 5 mg | ORAL | @ 16:00:00 | Stop: 2022-11-03

## 2022-11-03 MED ADMIN — diclofenac sodium (VOLTAREN) 1 % gel 2 g: 2 g | TOPICAL | @ 21:00:00

## 2022-11-03 MED ADMIN — baclofen (LIORESAL) tablet 20 mg: 20 mg | ORAL | @ 14:00:00 | Stop: 2022-11-03

## 2022-11-03 MED ADMIN — buprenorphine-naloxone (SUBOXONE) 2-0.5 mg SL film 0.5 mg of buprenorphine: .25 | SUBLINGUAL | @ 14:00:00

## 2022-11-03 MED ADMIN — ceFAZolin (ANCEF) IVPB 2 g in 50 ml dextrose (premix): 2 g | INTRAVENOUS | @ 14:00:00 | Stop: 2022-11-09

## 2022-11-03 MED ADMIN — buprenorphine-naloxone (SUBOXONE) 2-0.5 mg SL film 0.5 mg of buprenorphine: .25 | SUBLINGUAL | @ 03:00:00

## 2022-11-03 MED ADMIN — ceFAZolin (ANCEF) IVPB 2 g in 50 ml dextrose (premix): 2 g | INTRAVENOUS | @ 19:00:00 | Stop: 2022-11-09

## 2022-11-03 MED ADMIN — pantoprazole (Protonix) oral suspension: 40 mg | ORAL | @ 03:00:00

## 2022-11-03 MED ADMIN — famotidine (PEPCID) tablet 20 mg: 20 mg | ORAL | @ 03:00:00

## 2022-11-03 MED ADMIN — diazePAM (VALIUM) tablet 5 mg: 5 mg | ORAL | @ 21:00:00

## 2022-11-03 MED ADMIN — carboxymethylcellulose sodium (REFRESH CELLUVISC) 1 % ophthalmic gel 1 drop: 1 [drp] | OPHTHALMIC | @ 03:00:00

## 2022-11-03 MED ADMIN — HYDROmorphone (PF) (DILAUDID) injection 0.5 mg: .5 mg | INTRAVENOUS | @ 05:00:00 | Stop: 2022-11-08

## 2022-11-03 MED ADMIN — promethazine (PHENERGAN) 6.5 mg in sodium chloride (NS) 0.9 % 50 mL IVPB: 6.5 mg | INTRAVENOUS | @ 23:00:00

## 2022-11-03 MED ADMIN — fluconazole (DIFLUCAN) 2 mg/mL in sodium chloride 0.9% 400 mg: 400 mg | INTRAVENOUS | @ 16:00:00 | Stop: 2022-11-09

## 2022-11-03 MED ADMIN — ondansetron (ZOFRAN) injection 4 mg: 4 mg | INTRAVENOUS | @ 15:00:00

## 2022-11-03 MED ADMIN — gabapentin (NEURONTIN) capsule 100 mg: 100 mg | ORAL | @ 14:00:00

## 2022-11-03 MED ADMIN — baclofen (LIORESAL) tablet 20 mg: 20 mg | ORAL | @ 03:00:00

## 2022-11-03 NOTE — Unmapped (Signed)
Hospitalist Daily Progress Note     LOS: 37 days under inpatient status    Assessment/Plan:  Principal Problem:    Other acute postprocedural pain  Active Problems:    [name]    Intestinal polyps    [name]    Neoplasm related pain    Physical deconditioning    History of colectomy    Intractable abdominal pain    Generalized anxiety disorder with panic attacks    SBO (small bowel obstruction) (CMS-HCC)    Small intestinal bacterial overgrowth (SIBO)         *Enterobacteriaceae group bacteremia, C albicans fungemia.  Resolving.  Likely due to RIJ tunneled line placed 01/2022 now removed.  8/13, 8/17 repeat blood cultures NGTD.  No evidence of vitreitis, retinitis 8/15 per Ophtho.  TTE 8/15 w possible vegetations on MV and TV or indwelling catheter; however, TEE 8/20 w/o vegetations.  (Cefepime 8/13-8/20, metronidazole 8/13-8/16, micafungin 8/14-8/22)  -Continue cefazolin (started 8/20), fluconazole (IV given difficulty tolerating PO form) through 8/31.  -Check CBC w diff weekly, CMP daily while on above.     *[name], desmoid fibromatosis.  H/O proctocolectomy, IPAA.  Follows w Dr. Theone Stanley [facility]).  S/P planned triamcinolone/bupivacaine injection of 2 RUE desmoid tumors, L chest tumor by IR 7/18, complicated by post-op pain which was initial reason for this stay.  -Holding home nirogacestat for now given acute illness, LFT elevation but now that appears to be improving, will look into restarting at 50mg  BID as before if this can help w current GI issues.  Will try to reach Dr. [name].     *Nausea.  Improving but not resolved.  Ileus resolved, needed NG decompression earlier this stay w TPN 7/21-8/13.  Baseline necessary opioid use.  H/O TPN dependence.  -Continue ND tube feeds (Osmolite 1.5 51ml/h, free water flush 50ml q3h) though probably needs closer to H2O q3h instead of 50ml q3h.  She will try to take more water PO.  -Continue bisacodyl, PEG, senna PRN for now as is having stools spontaneously.  -Continue naloxegol.  -Allowing IV promethazine PRN.     *Generalized anxiety disorder, depression, h/o panic attacks.  -Continue home mirtazapine.  -Continue home PRN lorazepam.  -Appreciate Psych???s assistance.     *Chronic abd pain.  Follows w [facility].  Typically on oxycodone 10mg  PRN at home.  Has successfully had ketamine and lidocaine infusions for pain.  S/P lidocaine 7/21-7/24, ketamine 8/1-8/9.  -Continue home buprenorphine-naloxone 0.5-0.125 TID.  -Continue oxycodone 10-15mg  q4h PRN for now.  -Allowing hydromorphone 0.5mg  q3h PRN IV for now.  -Continue gabapentin.     *Muscle spasm, stiffness, deconditioning.  MRI C, T, L-spine and MRI brain all w and w/o contrast unremarkable.  7/20 paraneoplastic Ab panel w slightly elevated P/Q-type Ca channel Ab which can be seen w [facility].  However, EMG NL.  -F/U autoimmune myelopathy panel sent 8/18.  -Continue baclofen for now but might consider starting to wean this.  -Gabapentin as above.  -Continue PT/OT.  -Continue diazepam for now (possible drug-drug interaction between lorazepam and fluconazole).  -Appreciate Neuro's assistance.     *Transaminase elevation.  Improved.  Liver U/S w Doppler 8/15 unremarkable.  ?2/2 TPN or nirogacestat; lower suspicion for fluconazole given LFT rise preceded starting fluconazole.  -Holding nirogacestat as above.  -Monitor.     *Oral candidiasis.  Resolved.     *Fe def anemia.  S/P 1.02g IV Fe (ferumoxytol).  H/O FE hypersensitivity reactions and evaluated by  [facility].     *SIBO.  Typically on cefdinir MWF, doxy TTSS at home.     *Coffee-ground emesis earlier this stay likely 2/2 NGT.  -Continue PPI BID for now.     *H/O DVT.     *DVT prophylaxis.  -Continue enoxaparin (noted that she is declining this at times).     *Dispo.  Pending above.  AIR referrals sent; she is very interested in AIR due to motivation to get back to baseline as quickly as able.  RIJ central line in place (placed 8/14).  Has gone home with [facility] before (12/2021); would probably be able to go to [facility] w this in my discussion w them as long as there is a plan regarding what to do with [facility] on DC (e.g., leaving it in on DC).               Please page the [facility] at [phone] with questions.              Consultants:   1. Neuro   2. Chronic Pain (off)  3. GI (off)  4. Psych  5. PM&R  6. Lower GI Surg (off)  7. ID (off)  8. Ophtho (off)      Pending labs:   Pending Labs       Order Current Status    Myelopathy, Autoimmune/Paraneoplastic Evaluation, Serum In process              Subjective:   Promethazine x 2, ondansetron x 1, hydromorphone x 5,  in last 24 hrs.  Rough day yesterday.  Trying to eat.  Having some L upper leg pain where she has what appears to be known fibromatosis of L quad muscle.  Remains motivated to try eating and to try more physical activity.      Objective:   Physical Exam:  General: Lying comfortably in bed, no acute distress.   Eyes: No conjunctival injection or icterus.     ENT: Mucous membranes moist.  Normal appearing ears, nose.   Neck: Midline trachea.  No obvious goiter.   Respiratory: Normal respiratory effort.     Cardiovascular: No lower extremity edema.   Gastrointestinal: Nondistended.   Skin: No obvious rashes or breakdown over painful area of L thigh.  Warm, dry.   Musculoskeletal: Palpable nodule in L anterior thigh that is not new.   Psychiatric: Alert.  Answers questions appropriately.  Judgment and insight intact.   Neurologic: Extraocular movements intact, no facial asymmetry.  No gross sensory deficits.       Vital signs in last 24 hours:  Temp:  [36.6 ??C (97.9 ??F)-36.7 ??C (98.1 ??F)] 36.6 ??C (97.9 ??F)  Heart Rate:  [79-98] 83  Resp:  [16-18] 16  BP: (104-123)/(60-81) 104/66  MAP (mmHg):  [75-93] 79  SpO2:  [98 %-100 %] 100 %    Intake/Output last 24 hours:    Intake/Output Summary (Last 24 hours) at 11/03/2022 0421  Last data filed at 11/02/2022 2150  Gross per 24 hour   Intake 1230 ml Output 1100 ml   Net 130 ml       Most recent recorded weights:  Last 5 Recorded Weights    09/29/22 1300 10/06/22 1021 10/09/22 1730 10/11/22 0530   Weight: 62.1 kg (137 lb) 60.8 kg (134 lb 1.6 oz) 61 kg (134 lb 6.4 oz) 60.2 kg (132 lb 11.5 oz)    10/26/22 1730   Weight: 54 kg (119 lb)  Medications:   Scheduled Meds:   baclofen  20 mg Oral TID    buprenorphine-naloxone  0.25 Film Sublingual TID    carboxymethylcellulose sodium  1 drop Both Eyes QID    ceFAZolin  2 g Intravenous Q8H    cetirizine  10 mg Enteral tube: gastric Daily    diazePAM  5 mg Oral TID    diclofenac sodium  2 g Topical QID    enoxaparin (LOVENOX) injection  40 mg Subcutaneous Q24H SCH    famotidine  20 mg Oral Nightly    fluconazole (DIFLUCAN) INTRAVENOUS  400 mg Intravenous Q24H    gabapentin  100 mg Oral TID    mirtazapine  15 mg Enteral tube: gastric Nightly    naloxegol  12.5 mg Oral Daily    [Provider Hold] nirogacestat  50 mg Oral BID    pantoprazole  40 mg Oral BID     Continuous Infusions:   Chemo Clarification Order      IP okay to treat      sodium chloride           ADMINISTRATIVE  60  minutes total spent on this encounter, including preparing to see the patient, seeing and examining the patient, counseling the patient, counseling family, placing orders, discussing the patient with other providers, documenting the encounter, and coordinating care.

## 2022-11-03 NOTE — Unmapped (Signed)
Patient remained alert oriented X 4. On 2 L nasal cannula. Patient complained of abdomen throughout the shift PRN IV dilaudid given. Patient also complained of nausea PRN phenergan and Zofran given. Patient still on continuous tube feed and tolerating well. Patient had multiple BM during the shift. Plan of care continues.     Problem: Adult Inpatient Plan of Care  Goal: Plan of Care Review  Outcome: Progressing  Flowsheets (Taken 11/02/2022 1935)  Progress: improving  Plan of Care Reviewed With: patient  Goal: Patient-Specific Goal (Individualized)  Outcome: Progressing  Goal: Absence of Hospital-Acquired Illness or Injury  Outcome: Progressing  Intervention: Identify and Manage Fall Risk  Recent Flowsheet Documentation  Taken 11/02/2022 1000 by Concha Norway, RN  Safety Interventions:   low bed   family at bedside  Taken 11/02/2022 0800 by Concha Norway, RN  Safety Interventions:   low bed   family at bedside  Taken 11/02/2022 0720 by Concha Norway, RN  Safety Interventions:   low bed   family at bedside   fall reduction program maintained  Intervention: Prevent and Manage VTE (Venous Thromboembolism) Risk  Recent Flowsheet Documentation  Taken 11/02/2022 1000 by Concha Norway, RN  Anti-Embolism Device Type: SCD, Knee  Anti-Embolism Intervention: On  Taken 11/02/2022 0800 by Concha Norway, RN  Anti-Embolism Device Type: SCD, Knee  Anti-Embolism Intervention: On  Taken 11/02/2022 0720 by Concha Norway, RN  Anti-Embolism Device Type: SCD, Knee  Anti-Embolism Intervention: On  Goal: Optimal Comfort and Wellbeing  Outcome: Progressing  Goal: Readiness for Transition of Care  Outcome: Progressing  Goal: Rounds/Family Conference  Outcome: Progressing     Problem: Latex Allergy  Goal: Absence of Allergy Symptoms  Outcome: Progressing     Problem: Fall Injury Risk  Goal: Absence of Fall and Fall-Related Injury  Outcome: Progressing  Intervention: Promote Injury-Free Environment  Recent Flowsheet Documentation  Taken 11/02/2022 1000 by Concha Norway, RN  Safety Interventions:   low bed   family at bedside  Taken 11/02/2022 0800 by Concha Norway, RN  Safety Interventions:   low bed   family at bedside  Taken 11/02/2022 0720 by Concha Norway, RN  Safety Interventions:   low bed   family at bedside   fall reduction program maintained     Problem: Skin Injury Risk Increased  Goal: Skin Health and Integrity  Outcome: Progressing     Problem: Self-Care Deficit  Goal: Improved Ability to Complete Activities of Daily Living  Outcome: Progressing     Problem: Comorbidity Management  Goal: Blood Glucose Levels Within Targeted Range  Outcome: Progressing  Goal: Blood Pressure in Desired Range  Outcome: Progressing     Problem: Nausea and Vomiting  Goal: Nausea and Vomiting Relief  Outcome: Progressing     Problem: Infection  Goal: Absence of Infection Signs and Symptoms  Outcome: Progressing  Intervention: Prevent or Manage Infection  Recent Flowsheet Documentation  Taken 11/02/2022 0720 by Concha Norway, RN  Infection Management: aseptic technique maintained     Problem: Pain Acute  Goal: Optimal Pain Control and Function  Outcome: Progressing     Problem: Malnutrition  Goal: Improved Nutritional Intake  Outcome: Progressing

## 2022-11-03 NOTE — Unmapped (Signed)
Patient alert and oriented x 4 and 1 assist to bedside commode.  Medications given per order.  Tube feeding running at 54ml/hr-patient unable to tolerate goal rate of 23ml/hr.  CVAD dressing changed this shift.  Patient continues to complain of abdominal pain-IV pain medicine given, see MAR.  VSS.  Family at bedside.   Problem: Adult Inpatient Plan of Care  Goal: Optimal Comfort and Wellbeing  Outcome: Ongoing - Unchanged     Problem: Self-Care Deficit  Goal: Improved Ability to Complete Activities of Daily Living  Outcome: Ongoing - Unchanged     Problem: Nausea and Vomiting  Goal: Nausea and Vomiting Relief  Outcome: Ongoing - Unchanged     Problem: Infection  Goal: Absence of Infection Signs and Symptoms  Outcome: Ongoing - Unchanged     Problem: Pain Acute  Goal: Optimal Pain Control and Function  Outcome: Ongoing - Unchanged     Problem: Malnutrition  Goal: Improved Nutritional Intake  Outcome: Ongoing - Unchanged

## 2022-11-03 NOTE — Unmapped (Signed)
Follow-up Consult Note         Recommendations          [name] [name] is a 22 y.o. female history of dermoid tumors status post numerous resections including paraspinal was admitted for postop pain and increased tone.  We were consulted for her increased tone and concern for lambert-eaton.    Increased Tone - Generalized Weakness  Initially she was noted to have worsening BLE stiffness since procedure on 7/18. Acute onset of muscle rigidity isolated to lower extremities associated with anesthesia. Differential includes prolonged anesthesia reaction, nutrition abnormalities, stiff person syndrome or other autoimmune myelopathy. 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 Due to concern for central process given increased tone, repeat MRI brain w wo was obtained which was normal. Nutritional work up completed to  evaluate for generalized dystonia as patient has had extended periods of TPN and thus far has only been remarkable with mildly low zinc, only mildly elevated manganese (not c/f toxicity) otherwise remainder has been unremarkable. Given unremarkable work up thus far, obtained AI/myelopathy panel to evaluate for stiff person syndrome however atypical to present with acute onset.  Had increased titer for calcium channel blocker antibody found on a paraneoplastic panel though EMG on 8/22 reassuring against Pincus Badder.     8/26:Primary team continues benzo trial now up to valium 5 mg TID with improvement per patient. Patient still feels she is stiff and UE with no increased tone.     Thank you for the consult. We will sign off at this time. Please page [phone] for any other concerns.  RECOMMENDATIONS:   Follow repeat autoimmune/paraneoplastic panel  No further neurologic work-up needed at this time.    This patient was seen and discussed with Dr. Lanora Manis, who agrees to the assessment and plan.       Marquis Buggy, MD  PGY-2, Neurology     Subjective        Reason for Consult: Increased tone    8/26:Feeling better since starting the valium, feels arms are almost normal, but legs are still stiff    Previous AVW:[id number] [name] [name] is a 22 y.o. female on whom I have been asked by Rocco Serene, MD to consult for muscle stiffness.     Admitted on 7/18 for pain following steroid/anesthetic injection of desmoid tumor in the right forearm and left upper chest under general anesthesia. Has chronic pain and follows with pain management. 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      RRT on 7/19 for chest pain, diaphoresis, tachycardia, retching and abdominal tenderness with continued complaint of pain. Reportedly patient was conversant and following commands. Vitals stable and patient was afebrile. Primary team noted flexion of bilateral arms and legs with question of rigidity.     Notable medications include suboxone, ativan 0.5mg  TID PRN, dilaudid PRN, oxycodone PRN, and baclofen 5mg  TID PRN. Has phenergan on MAR received one dose on 7/18 as well as Zofran.      Patient notes worsening pain, nausea, appetite loss, and headaches since about Saturday leading up to her procedure. Has been largely in bed with minimal ambulation due to these symptoms and has been more stiff over the past week. Acute worsening of lower extremity and abdominal muscle tightness and pain following the procedure on 7/18, now feels like she cannot move her ankles well and that she is  shaky when she stands. The muscle tightness is provoking increased pain.      Also notes persistent whole-head headache associated with visual scotomas.      Recently started on nirogacestat (a notch receptor pathway inhibitor) for her tumors         Objective        Temp:  [36.5 ??C (97.7 ??F)-36.7 ??C (98.1 ??F)] 36.7 ??C (98.1 ??F)  Heart Rate:  [67-98] 67  Resp:  [16-18] 16  BP: (94-123)/(58-76) 94/58  MAP (mmHg):  [70-90] 70  SpO2:  [98 %-100 %] 100 %  No intake/output data recorded.    Physical Exam:  General Appearance:Chronically ill appearing. In no acute distress.  HEENT: Head is atraumatic and normocephalic. Sclera anicteric without injection. NGT in place.  Lungs:  Normal work of breathing on [place]  Heart: Radial pulses 2+ bilaterally.  Abdomen: Nondistended.  Extremities: No clubbing, cyanosis, or edema.    Neurological Examination:     Mental Status: Alert, conversant, able to follow conversation and interview. Spontaneous speech was fluent without word finding pauses, dysarthria, or paraphasic errors. Comprehension was intact to simple and multi-step commands. Memory for recent and remote events was intact.    Cranial Nerves: PERRL 4 mm. Pursuit eye movements were uninterrupted with full range and without more than end-gaze nystagmus. Facial sensation intact bilaterally to light touch on the forehead, cheek, and chin. Face symmetric at rest. Normal facial movement bilaterally, including forehead, eye closure and grimace/smile. Hearing intact to conversation. Shoulder shrug full strength bilaterally. Palate movement is symmetric. Tongue protrudes midline and tongue movements are normal.    Motor Exam: Normal bulk.  No tremors, myoclonus, or other adventitious movement.  Pronator drift is absent. Tone normal in UE. LE normal tone with distraction.  UE R/L: deltoid 4/4+, biceps 4/4+, triceps 4/4+, handgrip 4+/4+  LE R/L: hip flexion 3/3, quadriceps 4-/4-, hamstrings 4-/4-, dorsiflexion 3/3, and plantar flexion 3/3.      Reflexes:Previously:   R L   Biceps +2 +2   Triceps +2 +2   Patella +2 +2   Achilles +2 +2   Toes are downgoing bilaterally.    Sensory:  Intact to light touch throughout.    Cerebellar/Coordination/Gait: Finger-to-nose is normal without ataxia or dysmetria bilaterally.          Medications:  Scheduled medications:    baclofen  20 mg Oral TID    buprenorphine-naloxone  0.25 Film Sublingual TID    carboxymethylcellulose sodium  1 drop Both Eyes QID    ceFAZolin  2 g Intravenous Q8H    cetirizine  10 mg Enteral tube: gastric Daily    diazePAM  5 mg Oral TID    diclofenac sodium  2 g Topical QID    enoxaparin (LOVENOX) injection  40 mg Subcutaneous Q24H SCH    famotidine  20 mg Oral Nightly    fluconazole (DIFLUCAN) INTRAVENOUS  400 mg Intravenous Q24H    gabapentin  100 mg Oral TID    mirtazapine  15 mg Enteral tube: gastric Nightly    naloxegol  12.5 mg Oral Daily    [Provider Hold] nirogacestat  50 mg Oral BID    pantoprazole  40 mg Oral BID     Continuous infusions:    Chemo Clarification Order      IP okay to treat      sodium chloride       PRN medications: acetaminophen, alum-mag-simeth, bisacodyl, calcium carbonate, carboxymethylcellulose sodium, Chemo Clarification Order, guaiFENesin, hydrocortisone, HYDROmorphone,  IP okay to treat, lidocaine, LORazepam, lidocaine-diphenhydrAMINE-aluminum-magnesium, ondansetron, oxyCODONE **OR** oxyCODONE, phenol, polyethylene glycol, promethazine, pseudoephedrine, senna, zinc oxide-cod liver oil     Diagnostic Studies      All Labs Last 24hrs:   Recent Results (from the past 24 hour(s))   Comprehensive Metabolic Panel    Collection Time: 11/03/22  6:03 AM   Result Value Ref Range    Sodium 140 135 - 145 mmol/L    Potassium 4.0 3.4 - 4.8 mmol/L    Chloride 104 98 - 107 mmol/L    CO2 31.0 20.0 - 31.0 mmol/L    Anion Gap 5 5 - 14 mmol/L    BUN 9 9 - 23 mg/dL    Creatinine 7.90 (L) 0.55 - 1.02 mg/dL    BUN/Creatinine Ratio 21     eGFR CKD-EPI (2021) Female >90 >=60 mL/min/1.35m2    Glucose 114 70 - 179 mg/dL    Calcium 9.7 8.7 - 24.0 mg/dL    Albumin 3.3 (L) 3.4 - 5.0 g/dL    Total Protein 6.3 5.7 - 8.2 g/dL    Total Bilirubin 0.2 (L) 0.3 - 1.2 mg/dL    AST 46 (H) <=97 U/L    ALT 37 10 - 49 U/L    Alkaline Phosphatase 154 (H) 46 - 116 U/L

## 2022-11-03 NOTE — Unmapped (Signed)
Physical Medicine and Rehab  Inpatient Consult Note    Requesting Attending Physician: Anell Barr, MD  Service Requesting Consult: [facility])    Thank you for this consult.  Please contact the PM&R consult pager [phone]) for questions regarding these recommendations.  For questions of bed availability at [facility], contact the Intake Office at [phone]. Please contact your case manager for updates on AIR process as they are in regular contact with the rehab intake office and should have the latest information.    *This PM&R consult does not guarantee that patient has been accepted to [facility] AIR, 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 Patient is currently hospitalized since 09/25/2022. The patient is seen in consultation for evaluation of rehabilitation needs.    Checklist for Potential AIR:   [ ]  EDD/what is estimated discharge date?  [ ]  Referral from CM if wants [place] AIR  [ ]  PT/OT within 48 hours of AIR  [ ]  dispo verified and [place] AIR preference  [ ]  CBC/BMP & other pertinent labs within 48 hours of AIR  [ ]  Prefer oral pain meds only, but if on IV pain meds, then no more than q 6-8 hours prn  [ ]  6 minute walk test completed for home oxygen requirement  [ ]  If on oxygen, no more than 4 LPM [place]  [ ]  Chemotherapy plan documented  [ ]  Currently needing TF via NGT.  May need to discharge to home with NGT. Feeding plan ongoing   [ ]  Documentation of therapy tolerance    Dispo: home with family. She previously was independent with basic ADLs. Has MSK limitations which required some assistance. She is in RN school    Functional Impairments and Recommendations  - Please continue to have patient work with PT, OT, and SLP to maximize functional status with mobility, ADLs, and cogniton/swallow function.  - Will start insurance authorization if demonstrated improvement with physical therapy and documented chemotherapy plan  - Fall risk precautions  - Recommend OOB to chair daily with assistance if appropriate    Principal Medical Problem:  Other acute postprocedural pain    Increased Tonicity  Dystonia   Started post-procedurally per patient ~09/25/22. Occurs intermittently throughout the day with a heightened occurrence during mobilization. At rest patient bilateral ankles in plantarflexion with worsening heel cord tightening. Has tonicity most prominent to HF and KE that is not velocity dependent. Has not noted significant improvement with increase of baclofen to 15 mg TID in setting of infection as below. Differential wise, Neurology previously evaluated patient seemingly ruling out an acute dystonic reaction, cord compression, a paraneoplastic etiology. Neurology recommended further evaluation for central process. B2, Niacin, Folate, Vit D, Copper WNL. Zinc low to 55.   - Baclofen to 20 mg TID - if worsening respiratory status, daytime somnolence, can decrease baclofen given improvement with valium   - On Valium 5 mg TID - most helpful   - At risk for contractures, Recommend ordering bilateral hard PRAFO boots  - Pending Vit B1, B6, E; manganese, zinc   - PO Ativan 0.5 mg TID PRN for anxiety  and IV Ativan 1 mg q8h prn for resistant nausea and vomiting may help with dystonia     Headache  Currently managed with tylenol. She does have some posterior neck muscle tightness. Notes ketamine is helpful for headache pains. Describes photophobia and phonophobia with events. I discussed with patient our hope is to decrease risk of rebound headaches and limit medical issues which will interfere with therapy. An additional thought is patient's headaches could be cervicogenic. She does have loss of cervical lordosis on most recent MRI.   - Given patient with elevated heart rate, frequent headaches, and has limited therapy participation 2/2 tachycardia in setting of continuous headaches  -If BP improves, would recommend propranolol 5 mg BID for headache prophylaxis.   - Prn tylenol +/- prn fiorcet   - Can consider using lidocaine cream PRN to area of pain for cervicogenic headaches    Pain  Has a chronic pain provider.   - Tylenol 650 mg q6h PRN  - IV Dilaudid 1 mg q3h PRN  - Oxyocodone 10 mg q4h PRN  - Suboxone 0.5 mg TID  - Continue Gabapentin 100 mg TID, can increase to 300mg  TID as tolerated    Multifactorial Ileus   [place] Emesis 2/2 NGT placement (resolved)  Contributors with history of narcotics, poor PO intake. Concern for malnutrition. Improvements to abdominal pain since weaning opioids. Now on TPN.  -TPN (duration)  -s/p opioid wean thus d/c relistor   -bowel regimen: suppository BID  -NG tube in place, NG tube clamped  -Trialing food/meds PO prior to NG tube removal   -IV PPI BID     Syncope  Multifactorial. Primary team ddx includes: vasovagal vs adverse effect of medications vs deconditioning vs iron deficiency. Last echo 04/2022  -work-up ongoing with plans for cosyntropin stim test and +/- repeat echo    IDA  -has allergy to formula iron     [name] - Desmoid Tumors  Dr. [name] is patient's outpatient oncologist.   -Pnirogacestat at 50 mg dose if able per primary team     GNR bacteremia  Yeast on 1/4 cultures from 8/13  High fevers - resolving  Unclear source: high risk for line infection given she was on TPN, GN also raise concern for intraabdominal source given clinical changes over the past 48 hours. CT did show a few liver hypo-attenuating lesions too small to characterize. UA was abnormal but not grossly infected and she has no urinary symptoms. Overnight 8/13 bottle grew GNR and yeast. All 3 other bottles NGTD. ID consulted with recommendations as below.   - Central line exchanged 8/14 while on broad spectrum antibacterial coverage but not fungal  - Repeat cultures ordered for 8/17 AM (72 hours given shortage)  - Continue cefepime, flagyl (started 8/13 PM - )  - Continue micafungin (started 8/14 PM - )  - Pending TTE  - Pending Ucx per ID request   - Tylenol prn fever, bolus fluids prn    Other Medical Considerations:  While in AIR, the patient has medical issues requiring active management/supervision by a rehabilitation physician, with face-to-face visits at least 3 days per week to assess the patient both medically and functionally and to modify the course of treatment as needed; The Physiatrist leads an intensive and coordinated interdisciplinary team approach to the delivery of inpatient rehabilitative care that cannot be provided in a less intensive setting such as the home, outpatient center or SNF. If not done in hospital, patient is at high risk  for falls, respiratory failure, bleeding, infections, or delirium.  Risk for VTE    Patient's Functional Goals  Increase mobility, return to RN school    Functional Status Updates:  Prior Functional Status:    Prior Functional Status: Pt reports following her last hospitalization she was able to go to the beach briefly and ambulate on the sand. Was also able to play mini-golf but unable to bend down to grab a ball. Since her egg retrieval and the initiation of her new chemo medication, she has been spending most of her time in bed. No falls in the past month, but did have a few falls last hospitalization due to syncope.    Current Functional Status: Therapy notes reviewed and analyzed   Activities of Daily Living:   Assessment  Problem List: Decreased strength, Impaired ADLs, Decreased activity tolerance, Decreased mobility, Fall risk, Gait deviation, Impaired balance, Pain, Decreased endurance, Decreased coordination  Assessment: Pt seen for progression of short term goals. Pt able to complete hair washing with max A, working towards OT goals. Pt limited this session 2/2 fatiuge with OT following PT session and administration of pain medication, nausea medication, and new medication. Pt and family highly motivated to participate in therapy and work towards goals. Pt continues to present with above deficits impacting safe and independent ADL participation. Pt continues to present below baseline and would continue to benefit from skilled OT services to maximize functional independence and safety.  Today's Interventions: ADL retraining, Balance activities, Bed mobility, Compensatory tech. training, Conservation, Education - Patient, Education - Family / caregiver, Endurance activities, Safety education  Chlorhexidine Treatment: Given  Today's Interventions: Pt educated on role of OT, OT POC, importance of OOB activity and ADL participation with Engineer, manufacturing. All patient questions answered appropriately and within scope of practice.            Mobility:   Bed Mobility comments: Supine to sit with SBA, HOB elevated and increased time to complete. Pt utilizing LUE for support but protecting R wrist. Sit to supine with Min A for LEs.        Skilled Treatment Performed: see gait goal  PT Post Acute Discharge Recommendations: Skilled PT services indicated, 5x weekly, High intensity  Cognition, Swallow, Speech:   Cognition / Swallow / Speech  Patient's Vision Adequate to Safely Complete Daily Activities: Yes  Patient's Judgement Adequate to Safely Complete Daily Activities: Yes  Patient's Memory Adequate to Safely Complete Daily Activities: Yes  Patient Able to Express Needs/Desires: Yes  Patient has speech problem: No           Assistive Devices: Rollator  Precautions:  Safety Interventions  Safety Interventions: fall reduction program maintained, low bed  Aspiration Precautions: awake/alert before oral intake  Bleeding Precautions: blood pressure closely monitored  Infection Management: aseptic technique maintained  Isolation Precautions: protective precautions maintained  Latex Precautions: latex precautions maintained    Rehabilitation Plan of Care  - Patient has complex rehab, nursing, and medical needs and may become appropriate for Acute Inpatient Rehabilitation with regular therapy participation / demonstrated progress, transitioned exclusively to oral pain medication, started chemotherapy agent  In AIR the patient would be expected to tolerate minimum of 3 hrs of therapy daily, 5x/week.  -Discussed rehab options today with patient and patient's mother. The patient/family/caregiver was counseled and educated on the purpose of Acute Inpatient Rehabilitation and questions/concerns were addressed.     In order for the patient to be considered for admission to [facility] inpatient rehab, the  case manager must give the patient / family choice in post-acute discharge options. If the patient desires [place], a referral from case management is required for acceptance to [facility] inpatient rehab.  A referral for [facility] inpatient rehab has not been received.     Please page the consult pager from 8AM-5PM weekdays or the first call pager after hours/weekends/holidays with questions.    The patient was seen and examined with Dr. Tanda Rockers, MD, who is in agreement with the above stated assessment and plan.    Thank you for this consult    SUBJECTIVE     Reason for Consult: Patient seen in consultation at the request of Anell Barr, MD for evaluation of rehabilitation needs and recommendations.    History of Present Illness: [name] Reather Converse is a 22 y.o. female currently hospitalized on 09/25/2022 for ileus and inability to tolerate PO intake.    Today  Doing well this afternoon. Reports some improvements since infection managed. Continues to feel short of breath. Worked with therapy recently and able to ambulate short distance. She still reports tremors, and knee buckling. Her tone has improved with valium per patient. Preference is discharge without NGT but is understanding if needed to continue.     Medical / Surgical History:   Past Medical History:   Diagnosis Date    Abdominal pain     Acid reflux     occas    Anesthesia complication     itching, shaking, coldness; last few surgeries have gone much better    Cancer (CMS-HCC)     Cataract of right eye     COVID-19 virus infection 01/2019    Cyst of thyroid determined by ultrasound     monitoring    [name]     2 right forearm, 1 left thigh, 1 right scapula, 1 under left clavicle; multiple    Difficult intravenous access     [name] (familial adenomatous polyposis)     [name]     Gastric polyps     History of chemotherapy     last treatment approx 05/2019    History of colon polyps     History of COVID-19 01/2019    Iron deficiency anemia due to chronic blood loss     received iron infusion 11-2019    PONV (postoperative nausea and vomiting)     Rectal bleeding     Syncopal episodes     especially if becoming dehydrated     Past Surgical History:   Procedure Laterality Date    COLON SURGERY      cyroablation      cystis removal      desmoid removal      PR CLOSE ENTEROSTOMY,RESEC+ANAST N/A 10/09/2020    Procedure: ILEOSTOMY TAKEDOWN;  Surgeon: Mickle Asper, MD;  Location: OR [place];  Service: General Surgery    PR COLONOSCOPY W/BIOPSY SINGLE/MULTIPLE N/A 10/27/2012    Procedure: COLONOSCOPY, FLEXIBLE, PROXIMAL TO SPLENIC FLEXURE; WITH BIOPSY, SINGLE OR MULTIPLE;  Surgeon: Shirlyn Goltz Mir, MD;  Location: PEDS PROCEDURE ROOM [place];  Service: Gastroenterology    PR COLONOSCOPY W/BIOPSY SINGLE/MULTIPLE N/A 09/14/2013    Procedure: COLONOSCOPY, FLEXIBLE, PROXIMAL TO SPLENIC FLEXURE; WITH BIOPSY, SINGLE OR MULTIPLE;  Surgeon: Shirlyn Goltz Mir, MD;  Location: PEDS PROCEDURE ROOM ;  Service: Gastroenterology    PR COLONOSCOPY W/BIOPSY SINGLE/MULTIPLE N/A 11/08/2014    Procedure: COLONOSCOPY, FLEXIBLE, PROXIMAL TO SPLENIC FLEXURE; WITH BIOPSY, SINGLE OR MULTIPLE;  Surgeon: Arnold Long Mir, MD;  Location: PEDS PROCEDURE ROOM [facility];  Service: Gastroenterology    PR COLONOSCOPY W/BIOPSY SINGLE/MULTIPLE N/A 12/26/2015    Procedure: COLONOSCOPY, FLEXIBLE, PROXIMAL TO SPLENIC FLEXURE; WITH BIOPSY, SINGLE OR MULTIPLE;  Surgeon: Arnold Long Mir, MD;  Location: PEDS PROCEDURE ROOM [facility];  Service: Gastroenterology    PR COLONOSCOPY W/BIOPSY SINGLE/MULTIPLE N/A 09/02/2017    Procedure: COLONOSCOPY, FLEXIBLE, PROXIMAL TO SPLENIC FLEXURE; WITH BIOPSY, SINGLE OR MULTIPLE;  Surgeon: Arnold Long Mir, MD;  Location: PEDS PROCEDURE ROOM [place];  Service: Gastroenterology    PR COLSC FLX W/REMOVAL LESION BY HOT BX FORCEPS N/A 08/27/2016    Procedure: COLONOSCOPY, FLEXIBLE, PROXIMAL TO SPLENIC FLEXURE; W/REMOVAL TUMOR/POLYP/OTHER LESION, HOT BX FORCEP/CAUTE;  Surgeon: Arnold Long Mir, MD;  Location: PEDS PROCEDURE ROOM [facility];  Service: Gastroenterology    PR COLSC FLX W/RMVL OF TUMOR POLYP LESION SNARE TQ N/A 02/25/2019    Procedure: COLONOSCOPY FLEX; W/REMOV TUMOR/LES BY SNARE;  Surgeon: Helyn Numbers, MD;  Location: GI PROCEDURES [place] [facility];  Service: Gastroenterology    PR COLSC FLX W/RMVL OF TUMOR POLYP LESION SNARE TQ N/A 03/13/2020    Procedure: COLONOSCOPY FLEX; W/REMOV TUMOR/LES BY SNARE;  Surgeon: Helyn Numbers, MD;  Location: GI PROCEDURES [place] [facility];  Service: Gastroenterology    PR EXC SKIN BENIG 2.1-3 CM TRUNK,ARM,LEG Right 02/25/2017    Procedure: EXCISION, BENIGN LESION INCLUDE MARGINS, EXCEPT SKIN TAG, LEGS; EXCISED DIAMETER 2.1 TO 3.0 CM;  Surgeon: Clarene Duke, MD;  Location: CHILDRENS OR [facility];  Service: Plastics    PR EXC SKIN BENIG 3.1-4 CM TRUNK,ARM,LEG Right 02/25/2017    Procedure: EXCISION, BENIGN LESION INCLUDE MARGINS, EXCEPT SKIN TAG, ARMS; EXCISED DIAMETER 3.1 TO 4.0 CM;  Surgeon: Clarene Duke, MD;  Location: CHILDRENS OR [facility];  Service: Plastics    PR EXC SKIN BENIG >4 CM FACE,FACIAL Right 02/25/2017    Procedure: EXCISION, OTHER BENIGN LES INCLUD MARGIN, FACE/EARS/EYELIDS/NOSE/LIPS/MUCOUS MEMBRANE; EXCISED DIAM >4.0 CM;  Surgeon: Clarene Duke, MD;  Location: CHILDRENS OR [facility];  Service: Plastics    PR EXC TUMOR SOFT TISSUE LEG/ANKLE SUBQ 3+CM Right 08/05/2019    Procedure: EXCISION, TUMOR, SOFT TISSUE OF LEG OR ANKLE AREA, SUBCUTANEOUS; 3 CM OR GREATER;  Surgeon: Arsenio Katz, MD;  Location: MAIN OR [place];  Service: Plastics    PR EXC TUMOR SOFT TISSUE LEG/ANKLE SUBQ <3CM Right 08/05/2019    Procedure: EXCISION, TUMOR, SOFT TISSUE OF LEG OR ANKLE AREA, SUBCUTANEOUS; LESS THAN 3 CM;  Surgeon: Arsenio Katz, MD;  Location: MAIN OR [facility];  Service: Plastics    PR LAP, SURG PROCTECTOMY W J-POUCH N/A 08/10/2020    Procedure: ROBOTIC ASSISTED LAPAROSCOPIC PROCTOCOLECTOMY, ILEAL J POUCH, WITH OSTOMY;  Surgeon: Mickle Asper, MD;  Location: OR [place];  Service: General Surgery    PR NDSC EVAL INTSTINAL POUCH DX W/COLLJ SPEC SPX N/A 01/23/2021    Procedure: ENDO EVAL SM INTEST POUCH; DX;  Surgeon: Modena Nunnery, MD;  Location: GI PROCEDURES [place] [facility];  Service: Gastroenterology    PR NDSC EVAL INTSTINAL POUCH DX W/COLLJ SPEC SPX N/A 08/27/2021    Procedure: ENDO EVAL SM INTEST POUCH; DX;  Surgeon: Hunt Oris, MD;  Location: GI PROCEDURES [facility];  Service: Gastroenterology    PR NDSC EVAL INTSTINAL POUCH DX W/COLLJ SPEC SPX N/A 12/09/2021    Procedure: ENDO EVAL SM INTEST POUCH; DX;  Surgeon: Vidal Schwalbe, MD;  Location: GI PROCEDURES [facility];  Service: Gastroenterology    PR NDSC EVAL INTSTINAL POUCH DX W/COLLJ SPEC SPX  Left 04/09/2022    Procedure: ENDO EVAL SM INTEST POUCH; DX;  Surgeon: Modena Nunnery, MD;  Location: [place] PROCEDURES [place] [facility];  Service: Gastroenterology    PR NDSC EVAL INTSTINAL POUCH DX W/COLLJ SPEC SPX N/A 08/05/2022    Procedure: ENDO EVAL SM INTEST POUCH; DX;  Surgeon: Modena Nunnery, MD;  Location: GI PROCEDURES [facility];  Service: Gastroenterology    PR NDSC EVAL INTSTINAL POUCH W/BX SINGLE/MULTIPLE N/A 01/20/2022    Procedure: ENDOSCOPIC EVAL OF SMALL INTESTINAL POUCH; DIAGNOSTIC, No biopsies;  Surgeon: Andrey Farmer, MD;  Location: GI PROCEDURES [facility];  Service: Gastroenterology    PR NDSC EVAL INTSTINAL POUCH W/BX SINGLE/MULTIPLE N/A 02/13/2022    Procedure: ENDOSCOPIC EVAL OF SMALL INTESTINAL POUCH; DIAGNOSTIC, WITH BIOPSY;  Surgeon: Bronson Curb, MD;  Location: GI PROCEDURES [facility];  Service: Gastroenterology    PR UNLISTED PROCEDURE SMALL INTESTINE  01/23/2021    Procedure: UNLISTED PROCEDURE, SMALL INTESTINE;  Surgeon: Modena Nunnery, MD;  Location: GI PROCEDURES [place] [facility];  Service: Gastroenterology    PR UNLISTED PROCEDURE SMALL INTESTINE  02/13/2022    Procedure: UNLISTED PROCEDURE, SMALL INTESTINE;  Surgeon: Bronson Curb, MD;  Location: GI PROCEDURES [facility];  Service: Gastroenterology    PR UPPER GI ENDOSCOPY,BIOPSY N/A 10/27/2012    Procedure: UGI ENDOSCOPY; WITH BIOPSY, SINGLE OR MULTIPLE;  Surgeon: Shirlyn Goltz Mir, MD;  Location: PEDS PROCEDURE ROOM [facility];  Service: Gastroenterology    PR UPPER GI ENDOSCOPY,BIOPSY N/A 09/14/2013    Procedure: UGI ENDOSCOPY; WITH BIOPSY, SINGLE OR MULTIPLE;  Surgeon: Shirlyn Goltz Mir, MD;  Location: PEDS PROCEDURE ROOM [facility];  Service: Gastroenterology    PR UPPER GI ENDOSCOPY,BIOPSY N/A 11/08/2014    Procedure: UGI ENDOSCOPY; WITH BIOPSY, SINGLE OR MULTIPLE;  Surgeon: Arnold Long Mir, MD;  Location: PEDS PROCEDURE ROOM [facility];  Service: Gastroenterology    PR UPPER GI ENDOSCOPY,BIOPSY N/A 12/26/2015    Procedure: UGI ENDOSCOPY; WITH BIOPSY, SINGLE OR MULTIPLE;  Surgeon: Arnold Long Mir, MD;  Location: PEDS PROCEDURE ROOM [facility];  Service: Gastroenterology    PR UPPER GI ENDOSCOPY,BIOPSY N/A 08/27/2016    Procedure: UGI ENDOSCOPY; WITH BIOPSY, SINGLE OR MULTIPLE;  Surgeon: Arnold Long Mir, MD;  Location: PEDS PROCEDURE ROOM [facility]; Service: Gastroenterology    PR UPPER GI ENDOSCOPY,BIOPSY N/A 09/02/2017    Procedure: UGI ENDOSCOPY; WITH BIOPSY, SINGLE OR MULTIPLE;  Surgeon: Arnold Long Mir, MD;  Location: PEDS PROCEDURE ROOM [facility];  Service: Gastroenterology    PR UPPER GI ENDOSCOPY,BIOPSY N/A 03/13/2020    Procedure: UGI ENDOSCOPY; WITH BIOPSY, SINGLE OR MULTIPLE;  Surgeon: Helyn Numbers, MD;  Location: GI PROCEDURES [place] [facility];  Service: Gastroenterology    PR UPPER GI ENDOSCOPY,BIOPSY N/A 09/05/2021    Procedure: UGI ENDOSCOPY; WITH BIOPSY, SINGLE OR MULTIPLE;  Surgeon: Wendall Papa, MD;  Location: GI PROCEDURES [facility];  Service: Gastroenterology    PR UPPER GI ENDOSCOPY,DIAGNOSIS N/A 01/20/2022    Procedure: UGI ENDO, INCLUDE ESOPHAGUS, STOMACH, & DUODENUM &/OR JEJUNUM; DX W/WO COLLECTION SPECIMN, BY BRUSH OR WASH;  Surgeon: Andrey Farmer, MD;  Location: GI PROCEDURES [facility];  Service: Gastroenterology    TUMOR REMOVAL      multiple-head, neck, back, hand, right flank, multiple        Social History:   Social History     Tobacco Use    Smoking status: Never     Passive exposure: Past    Smokeless tobacco: Never   Vaping Use    Vaping status: Never  Used   Substance Use Topics    Alcohol use: Never    Drug use: Never       Living Environment: House  Lives With: Mother, Father  Home Living: Multi-level home, Bed/bath upstairs, 1/2 bath on main level, Stairs to alternate level with rails, Stairs to enter without rails, Stairs to alternate level without rails, Handicapped height toilet, Tub/shower unit, Able to Live on main level with bedroom/bathroom  Number of Stairs to Enter (outside): 2  Rail placement (inside): Rail on right side  Number of Stairs to Alternate level (inside): 14  Caregiver Identified?: Yes  Caregiver Availability: 24 hours  Caregiver Ability: Limited lifting    Family History: Reviewed  family history includes Alcohol abuse in her paternal grandfather; Arthritis in her maternal grandfather; Asthma in her maternal grandfather; COPD in her paternal grandmother; Cancer in her maternal grandmother; Diabetes in her maternal grandmother; Hypertension in her maternal grandmother; Miscarriages / [place] in her paternal grandmother; No Known Problems in her brother, father, maternal aunt, maternal uncle, mother, paternal aunt, paternal uncle, and sister; Other in her maternal grandmother; Stroke in her maternal grandmother; Thyroid disease in her maternal grandmother.    Allergies:   Adhesive tape-silicones; Ferrlecit [sodium ferric gluconat-sucrose]; Levofloxacin; Methylnaltrexone; Neomycin; Papaya; Morphine; Zosyn [piperacillin-tazobactam]; Compazine [prochlorperazine]; Iron analogues; Iron dextran; and Latex, natural rubber    Medications:   Scheduled    buprenorphine-naloxone (SUBOXONE) 2-0.5 mg SL film 0.5 mg of buprenorphine TID    carboxymethylcellulose sodium (REFRESH CELLUVISC) 1 % ophthalmic gel 1 drop QID    ceFAZolin (ANCEF) IVPB 2 g in 50 ml dextrose (premix) Q8H    diazePAM (VALIUM) tablet 5 mg TID    diclofenac sodium (VOLTAREN) 1 % gel 2 g QID    enoxaparin (LOVENOX) syringe 40 mg Q24H SCH    fluconazole (DIFLUCAN) 2 mg/mL in sodium chloride 0.9% 400 mg Q24H    gabapentin (NEURONTIN) capsule 100 mg TID    [Provider Hold] nirogacestat (OGSIVEO) tablet 50 mg *Patient Supplied* BID     PRN acetaminophen, 650 mg, Q6H PRN  alum-mag-simeth, 60 mL, Q6H PRN  bisacodyl, 10 mg, BID PRN  calcium carbonate, 400 mg elem calcium, Daily PRN  carboxymethylcellulose sodium, 2 drop, QID PRN  Chemo Clarification Order, , Continuous PRN  guaiFENesin, 200 mg, Q4H PRN  hydrocortisone, , BID PRN  HYDROmorphone, 0.5 mg, Q3H PRN  IP okay to treat, , Continuous PRN  lidocaine, , TID PRN  LORazepam, 1 mg, BID PRN  lidocaine-diphenhydrAMINE-aluminum-magnesium, 10 mL, QID PRN  ondansetron, 4 mg, Q12H PRN  oxyCODONE, 10 mg, Q4H PRN   Or  oxyCODONE, 15 mg, Q4H PRN  phenol, 2 spray, Q2H PRN  polyethylene glycol, 17 g, BID PRN  promethazine, 6.5 mg, Q6H PRN  pseudoephedrine, 30 mg, Daily PRN  senna, 1 tablet, BID PRN  zinc oxide-cod liver oil, , Daily PRN      Continuous Infusions Chemo Clarification Order  IP okay to treat  sodium chloride        OBJECTIVE     Vitals:  Temp:  [36.5 ??C (97.7 ??F)-36.7 ??C (98.1 ??F)] 36.7 ??C (98.1 ??F)  Heart Rate:  [67-87] 87  Resp:  [16] 16  BP: (94-107)/(58-66) 99/59  MAP (mmHg):  [70-79] 71  SpO2:  [100 %] 100 %  Vitals:    11/03/22 0445 11/03/22 0700 11/03/22 0836 11/03/22 1109   BP: 101/66  94/58 99/59   Pulse: 83 72 67 87   Resp: 16  16 16  Temp: 36.5 ??C (97.7 ??F)  36.7 ??C (98.1 ??F)    TempSrc: Oral      SpO2: 100% 100% 100% 100%   Weight:       Height:           Patient Lines/Drains/Airways Status       Active Peripheral & Central Intravenous Access       Name Placement date Placement time Site Days    CVC Triple Lumen 10/22/22 Non-tunneled Right Internal jugular 10/22/22  1521  Internal jugular  12                  Patient Lines/Drains/Airways Status       Active Wounds       None                    Physical Exam:    GEN: Lying in bed in NAD.  HEENT: Normocephalic. Moist mucous membranes. NGT in place  RESP: NWOB on supplemental O2. Has to take breaths between sentences occasionally.   CV: Warm and well-perfused.  GI: non distended   GU: deferred  SKIN: without lesions, bruises, ecchymosis, rashes on clothed examination  MSK: TTP to bilateral knees (R>L) with passive/active range of motion   NEURO:  Mental Status: A&Ox4, attention intact, speech fluid and coherent, follows commands well and answers questions appropriately  Sensory: Sensory is intact to light touch with exception of left ulnar distribution involving forearm & 4/5th digits  Motor:      Delt Bi Tri WE HG  RUE 5 5 5 5 4     LUE 5 5 5 5 4                  HF KE       DF       PF        RLE     2 2 2 3               LLE   2 2 1 3       Lower extremity exam limited by pain    Abbreviations:   Delt= Deltoid, Bi= Biceps, Tri= Triceps, WF= Wrist flexor, WE= Wrist extensor, HG= Hand Grip  HF= Hip flexor, KE= Knee Extensor, DF= Dorsiflexor, PF= Plantar Flexor, EHL= Extensor Hallicus Longus    Tone: increased tone most prominent to bilateral lower extremities with hip flexion, knee flexor & knee extension, tone is variable, there is increasd tone to bilateral gastroc-soleus complex  Reflexes: no clonus    Labs and Diagnostic Studies: Reviewed     Radiology Results: Reviewed   Reviewed MRI CTL    ECG 12 Lead    Result Date: 11/02/2022  NORMAL SINUS RHYTHM WITHIN NORMAL LIMITS WHEN COMPARED WITH ECG OF 21-Oct-2022 14:20, VENT. 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     Sensory NCS   Nerve / Sites Rec. Site Peak Lat Ref. PP Amp Ref. Distance Vel.   ms ms ??V ??V cm m/s L Median - Dig II (Antidromic)    Wrist Index 2.65 ?3.80 88.2 ?10.0 14 71 L Ulnar - Dig V (Antidromic)    Wrist Dig V 2.75 ?3.80 57.6 ?10.0 14 65 L Radial - Superficial (Antidromic)    Forearm Wrist 2.17 ?2.80 52.2  10 62 L Sural - (Antidromic)    Calf Ankle 4.02 ?4.20 25.1 ?5.0 14 43 L SUP FIBULAR (PERON) - Ankle    Lat leg Ankle 3.17 ?3.40 12.3 ?5.0 10 39   Motor NCS   Nerve / Sites Muscle Latency Ref. Amplitude Ref. Dur. Distance Lat Diff Velocity Ref.   ms ms mV mV ms cm ms m/s m/s L Median - APB    Wrist APB 2.69 ?4.40 11.2 ?4.2 6.29 8       Elbow APB 6.65  11.0  6.42 21 3.96 53.1 ?49.0 L Ulnar - ADM    Wrist ADM 2.58 ?3.70 7.9 ?5.6 5.92 8       B.Elbow ADM 5.67  7.0  6.00 17 3.08 55.1 ?49.0    A.Elbow ADM 7.48  6.1  6.75 10 1.81 55.2 ?49.0 L Ulnar - ADM - Pincus Badder    Wrist ADM 2.00 ?18.10 7.8 ?5.6 5.56 80       Post Ex. - 10 Sec ADM 2.00  8.6  5.38  0.00  ?49.0 L COMMON FIBULAR (PERON) - EDB    Ankle EDB 3.54 ?5.47 10.7 ?2.2 5.48 7.5       B. Fib Head EDB 9.58  10.1  5.90 30 6.04 49.7 ?41.0    A. Fib Head EDB 11.40  10.0  6.17 10 1.81 55.2 ?41.0 L Tibial - AH    Ankle AH 3.77 ?5.58 18.1 ?2.8 5.54 7.5       Knee AH 12.10  15.3  6.13 38 8.33 45.6 ?41.0   F  Wave   Nerve F min Ref.  ms ms L COMMON FIBULAR (PERON) - EDB 44.0 ?56.0 L Tibial - AH 47.0 ?56.0 L Median - APB 24.9 ?31.0 L Ulnar - ADM 26.5 ?31.0   Repetitive Nerve Stimulation (RNS)   Anatomy / Train Rate Amplitude ? Amp 4-1 Fac Ampl Area ?Area 4-1 Fac Area  Hz mV % % mVms % % L Ulnar - ADM Baseline 2 9.3 -1 100 27.0 -4.3 100 Post 1 min Exercise 2 7.9 2.3 84.7 24.5 -7.2 90.6   EMG Summary Table   Spontaneous MUP Recruitment Comment Muscle Nerve Roots Fib PSW Fasc Other Amp Dur. PPP Pattern Other R. Tibialis anterior Deep peroneal (Fibular) L4-L5   None None N N N Normal None R. Gastrocnemius (Medial head) Tibial S1-S2 None None None None N N N Normal None L. Vastus medialis Femoral L2-L4 None None None None N N N Normal None L. Iliopsoas Femoral L2-L3 None None None None N N N Normal None L. Gastrocnemius (Lateral head) Tibial S1-S2 None None None None N N N Normal None   Summary: After identifying the patient in the waiting room and reviewing all appropriately available medical records, the patient was taken back to the examination room where the procedure was explained, the sites of examination were  noted, the patient's questions were answered, and the patient's verbal consent for the procedure was obtained. Studies were performed at [facility] using a radiant warmer and Synergy EMG system.         Sensory nerve conduction study in the left arm and left leg is normal.     Extensive motor nerve conduction study in the left arm and left leg is also normal with preserved amplitudes.  Lambert-Eaton screen with 10 seconds of exercise did not produce a significant incremental in response.  Repetitive nerve stimulation study at baseline and after exercise was also normal.     Needle EMG is normal.     Conclusions: This is a normal study.              - - - - - - - - - - - - -   Resident or Fellow     As the attending physician, my signature affirms that I personally participated in the clinical assessment of this patient, performed or reviewed all electrodiagnostic procedures, and prepared or reviewed the conclusions of this report.         - - - - - - - - - - - -  Attending Electromyographer     Gasper Sells, MD Carmon Ginsberg. 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     Sensory NCS   Nerve / Sites Rec. Site Peak Lat Ref. PP Amp Ref. Distance Vel.   ms ms ??V ??V cm m/s L Median - Dig II (Antidromic)    Wrist Index 2.65 ?3.80 88.2 ?10.0 14 71 L Ulnar - Dig V (Antidromic)    Wrist Dig V 2.75 ?3.80 57.6 ?10.0 14 65 L Radial - Superficial (Antidromic)    Forearm Wrist 2.17 ?2.80 52.2  10 62 L Sural - (Antidromic)    Calf Ankle 4.02 ?4.20 25.1 ?5.0 14 43 L SUP FIBULAR (PERON) - Ankle    Lat leg Ankle 3.17 ?3.40 12.3 ?5.0 10 39   Motor NCS   Nerve / Sites Muscle Latency Ref. Amplitude Ref. Dur. Distance Lat Diff Velocity Ref.   ms ms mV mV ms cm ms m/s m/s L Median - APB    Wrist APB 2.69 ?4.40 11.2 ?4.2 6.29 8       Elbow APB 6.65  11.0  6.42 21 3.96 53.1 ?49.0 L Ulnar - ADM    Wrist ADM 2.58 ?3.70 7.9 ?5.6 5.92 8       B.Elbow ADM 5.67  7.0  6.00 17 3.08 55.1 ?49.0    A.Elbow ADM 7.48  6.1  6.75 10 1.81 55.2 ?49.0 L Ulnar - ADM - [place] [facility]  Eaton    Wrist ADM 2.00 ?18.10 7.8 ?5.6 5.56 80       Post Ex. - 10 Sec ADM 2.00  8.6  5.38  0.00  ?49.0 L COMMON FIBULAR (PERON) - EDB    Ankle EDB 3.54 ?5.47 10.7 ?2.2 5.48 7.5       B. Fib Head EDB 9.58  10.1  5.90 30 6.04 49.7 ?41.0    A. Fib Head EDB 11.40  10.0  6.17 10 1.81 55.2 ?41.0 L Tibial - AH    Ankle AH 3.77 ?5.58 18.1 ?2.8 5.54 7.5       Knee AH 12.10  15.3  6.13 38 8.33 45.6 ?41.0   F  Wave   Nerve F min Ref.  ms ms L COMMON FIBULAR (PERON) - EDB 44.0 ?56.0 L Tibial - AH 47.0 ?56.0 L Median - APB 24.9 ?31.0 L Ulnar - ADM 26.5 ?31.0   Repetitive Nerve Stimulation (RNS)   Anatomy / Train Rate Amplitude ? Amp 4-1 Fac Ampl Area ?Area 4-1 Fac Area  Hz mV % % mVms % % L Ulnar - ADM Baseline 2 9.3 -1 100 27.0 -4.3 100 Post 1 min Exercise 2 7.9 2.3 84.7 24.5 -7.2 90.6   EMG Summary Table   Spontaneous MUP Recruitment Comment Muscle Nerve Roots Fib PSW Fasc Other Amp Dur. PPP Pattern Other R. Tibialis anterior Deep peroneal (Fibular) L4-L5   None None N N N Normal None R. Gastrocnemius (Medial head) Tibial S1-S2 None None None None N N N Normal None L. Vastus medialis Femoral L2-L4 None None None None N N N Normal None L. Iliopsoas Femoral L2-L3 None None None None N N N Normal None L. Gastrocnemius (Lateral head) Tibial S1-S2 None None None None N N N Normal None   Summary: After identifying the patient in the waiting room and reviewing all appropriately available medical records, the patient was taken back to the examination room where the procedure was explained, the sites of examination were noted, the patient's questions were answered, and the patient's verbal consent for the procedure was obtained. Studies were performed at [facility] using a radiant warmer and Synergy EMG system.         Sensory nerve conduction study in the left arm and left leg is normal.     Extensive motor nerve conduction study in the left arm and left leg is also normal with preserved amplitudes.  Lambert-Eaton screen with 10 seconds of exercise did not produce a significant incremental in response.  Repetitive nerve stimulation study at baseline and after exercise was also normal.     Needle EMG is normal.     Conclusions: This is a normal study.              - - - - - - - - - - - - -   Resident or Fellow     As the attending physician, my signature affirms that I personally participated in the clinical assessment of this patient, performed or reviewed all electrodiagnostic procedures, and prepared or reviewed the conclusions of this report.         - - - - - - - - - - - -  Attending Electromyographer     Gasper Sells, MD Carmon Ginsberg. Distinguished Professor Stage manager, Division of Neuromuscular Medicine              XR Abdomen 1 View    Result Date: 10/29/2022  EXAM: XR ABDOMEN 1 VIEW  ACCESSION: [id number] UN     CLINICAL INDICATION: 22 years old with NGT (CATHETER VASCULAR FIT & ADJ)      COMPARISON: CT abdomen/pelvis 10/21/2022, abdominal radiograph 10/20/2018     TECHNIQUE: Supine view of the abdomen, 1 image(s)     FINDINGS: Weighted enteric tube with distal tip projecting over the second portion of the duodenum. Suture material overlies the right lower quadrant.     Gaseous distention of the stomach. Partially imaged nonobstructive bowel gas pattern. Partially imaged mild degenerative changes of the lower lumbar spine. No acute osseous abnormality. Soft tissues are unremarkable. Lung bases are clear.         Weighted enteric tube with distal tip projecting over the second portion of the duodenum.         Echocardiogram Transesophageal Echo    Result Date: 10/28/2022  Patient Info Name:     [name] [name] Age:     21 years DOB:     03-23-2000 Gender:     Female MRN:     [id number] Accession #:     [id number] UN Account #:     [id number] Exam Date:     10/28/2022 4:09 PM Admit Date:     09/25/2022     Exam Type:     ECHOCARDIOGRAM TRANSESOPHAGEAL ECHO     Technical Quality:     Fair     Staff Sonographer:     Lavonda Jumbo Referring Physician:     Worthy Flank Reading Fellow:     Virgel Bouquet Ordering Physician:     Suanne Marker     Study Info Indications      - TV/RA vegetation Procedure(s)   Complete two-dimensional, color flow and Doppler transesophageal study is performed. Three dimensional echocardiographic imaging is performed during the transesophageal echocardiogram.         Summary   1. TEE to evaluate for endocarditis.   2. Prominent eustachian valve and chiari network visualized within right atrium, infectious etiology is felt to be less likely.   3. No other apparent valvular vegetations.   4. Catheter tip noted in SVC without associated vegetation.         Anesthesia/Analgesia   The patient received general anesthesia. Sedation was performed by anesthesiologist.     Procedure Details   The procedure was explained and the consent form was signed. The patient arrived in a fasting state. The patient tolerated the procedure well and there were no complications. There was no difficulty inserting the probe. Number of insertion attempts: 1. I was present and supervised the entire procedure, including insertion of the transesophageal probe. Right Atrium   Catheter tip noted in SVC without associated vegetation. A prominent eustachian valve is noted.         Aortic Valve   The aortic valve is trileaflet with normal appearing leaflets with normal excursion. There is no significant aortic regurgitation.     Pulmonic Valve   The pulmonic valve is poorly visualized, but probably normal. There is no significant pulmonic regurgitation.     Mitral Valve   The mitral valve leaflets are normal with normal leaflet mobility. There is trivial mitral valve regurgitation.     Tricuspid Valve   The tricuspid valve leaflets are normal, with normal leaflet mobility. There is trivial tricuspid regurgitation.         Pericardium/Pleural   There is no pericardial effusion.     Other Findings   Rhythm: Sinus Rhythm.  Report Signatures Finalized by Ofilia Neas  MD on 10/28/2022 08:24 PM Resident Virgel Bouquet on 10/28/2022 05:27 PM

## 2022-11-04 LAB — COMPREHENSIVE METABOLIC PANEL
ALBUMIN: 3.3 g/dL — ABNORMAL LOW (ref 3.4–5.0)
ALKALINE PHOSPHATASE: 151 U/L — ABNORMAL HIGH (ref 46–116)
ALT (SGPT): 53 U/L — ABNORMAL HIGH (ref 10–49)
ANION GAP: 5 mmol/L (ref 5–14)
AST (SGOT): 97 U/L — ABNORMAL HIGH (ref <=34)
BILIRUBIN TOTAL: 0.2 mg/dL — ABNORMAL LOW (ref 0.3–1.2)
BLOOD UREA NITROGEN: 9 mg/dL (ref 9–23)
BUN / CREAT RATIO: 19
CALCIUM: 9.5 mg/dL (ref 8.7–10.4)
CHLORIDE: 105 mmol/L (ref 98–107)
CO2: 30 mmol/L (ref 20.0–31.0)
CREATININE: 0.47 mg/dL — ABNORMAL LOW
EGFR CKD-EPI (2021) FEMALE: >90 mL/min/{1.73_m2} (ref >=60)
GLUCOSE RANDOM: 119 mg/dL (ref 70–179)
POTASSIUM: 3.9 mmol/L (ref 3.4–4.8)
PROTEIN TOTAL: 6.1 g/dL (ref 5.7–8.2)
SODIUM: 140 mmol/L (ref 135–145)

## 2022-11-04 LAB — CK: CREATINE KINASE TOTAL: 19 U/L — ABNORMAL LOW

## 2022-11-04 MED ADMIN — diazePAM (VALIUM) tablet 5 mg: 5 mg | ORAL | @ 21:00:00

## 2022-11-04 MED ADMIN — ceFAZolin (ANCEF) IVPB 2 g in 50 ml dextrose (premix): 2 g | INTRAVENOUS | @ 04:00:00 | Stop: 2022-11-09

## 2022-11-04 MED ADMIN — carboxymethylcellulose sodium (REFRESH CELLUVISC) 1 % ophthalmic gel 1 drop: 1 [drp] | OPHTHALMIC | @ 21:00:00

## 2022-11-04 MED ADMIN — baclofen (LIORESAL) tablet 20 mg: 20 mg | ORAL | @ 15:00:00

## 2022-11-04 MED ADMIN — ceFAZolin (ANCEF) IVPB 2 g in 50 ml dextrose (premix): 2 g | INTRAVENOUS | @ 15:00:00 | Stop: 2022-11-09

## 2022-11-04 MED ADMIN — buprenorphine-naloxone (SUBOXONE) 2-0.5 mg SL film 0.5 mg of buprenorphine: .25 | SUBLINGUAL | @ 21:00:00

## 2022-11-04 MED ADMIN — baclofen (LIORESAL) tablet 20 mg: 20 mg | ORAL | @ 21:00:00

## 2022-11-04 MED ADMIN — gabapentin (NEURONTIN) capsule 100 mg: 100 mg | ORAL | @ 02:00:00

## 2022-11-04 MED ADMIN — senna (SENOKOT) tablet 1 tablet: 1 | ORAL | @ 05:00:00

## 2022-11-04 MED ADMIN — HYDROmorphone (PF) (DILAUDID) injection 0.5 mg: .5 mg | INTRAVENOUS | @ 21:00:00 | Stop: 2022-11-08

## 2022-11-04 MED ADMIN — mirtazapine (REMERON) tablet 15 mg: 15 mg | GASTROENTERAL | @ 02:00:00

## 2022-11-04 MED ADMIN — carboxymethylcellulose sodium (REFRESH CELLUVISC) 1 % ophthalmic gel 1 drop: 1 [drp] | OPHTHALMIC | @ 02:00:00

## 2022-11-04 MED ADMIN — buprenorphine-naloxone (SUBOXONE) 2-0.5 mg SL film 0.5 mg of buprenorphine: .25 | SUBLINGUAL | @ 16:00:00

## 2022-11-04 MED ADMIN — buprenorphine-naloxone (SUBOXONE) 2-0.5 mg SL film 0.5 mg of buprenorphine: .25 | SUBLINGUAL | @ 03:00:00

## 2022-11-04 MED ADMIN — baclofen (LIORESAL) tablet 20 mg: 20 mg | ORAL | @ 02:00:00

## 2022-11-04 MED ADMIN — carboxymethylcellulose sodium (REFRESH CELLUVISC) 1 % ophthalmic gel 1 drop: 1 [drp] | OPHTHALMIC | @ 09:00:00

## 2022-11-04 MED ADMIN — naloxegol (MOVANTIK) 12.5 mg tablet 12.5 mg: 12.5 mg | ORAL | @ 16:00:00

## 2022-11-04 MED ADMIN — pantoprazole (Protonix) EC tablet 40 mg: 40 mg | ORAL | @ 03:00:00

## 2022-11-04 MED ADMIN — promethazine (PHENERGAN) 6.5 mg in sodium chloride (NS) 0.9 % 50 mL IVPB: 6.5 mg | INTRAVENOUS | @ 05:00:00

## 2022-11-04 MED ADMIN — HYDROmorphone (PF) (DILAUDID) injection 0.5 mg: .5 mg | INTRAVENOUS | @ 02:00:00 | Stop: 2022-11-08

## 2022-11-04 MED ADMIN — fluconazole (DIFLUCAN) 2 mg/mL in sodium chloride 0.9% 400 mg: 400 mg | INTRAVENOUS | @ 17:00:00 | Stop: 2022-11-09

## 2022-11-04 MED ADMIN — carboxymethylcellulose sodium (REFRESH CELLUVISC) 1 % ophthalmic gel 1 drop: 1 [drp] | OPHTHALMIC | @ 16:00:00

## 2022-11-04 MED ADMIN — gabapentin (NEURONTIN) capsule 100 mg: 100 mg | ORAL | @ 21:00:00

## 2022-11-04 MED ADMIN — oxyCODONE (ROXICODONE) immediate release tablet 10 mg: 10 mg | ORAL | @ 05:00:00 | Stop: 2022-11-14

## 2022-11-04 MED ADMIN — HYDROmorphone (PF) (DILAUDID) injection 0.5 mg: .5 mg | INTRAVENOUS | @ 09:00:00 | Stop: 2022-11-08

## 2022-11-04 MED ADMIN — diclofenac sodium (VOLTAREN) 1 % gel 2 g: 2 g | TOPICAL | @ 16:00:00

## 2022-11-04 MED ADMIN — HYDROmorphone (PF) (DILAUDID) injection 0.5 mg: .5 mg | INTRAVENOUS | @ 04:00:00 | Stop: 2022-11-08

## 2022-11-04 MED ADMIN — gabapentin (NEURONTIN) capsule 100 mg: 100 mg | ORAL | @ 15:00:00

## 2022-11-04 MED ADMIN — diclofenac sodium (VOLTAREN) 1 % gel 2 g: 2 g | TOPICAL | @ 02:00:00

## 2022-11-04 MED ADMIN — cetirizine (ZYRTEC) tablet 10 mg: 10 mg | GASTROENTERAL | @ 15:00:00

## 2022-11-04 MED ADMIN — diclofenac sodium (VOLTAREN) 1 % gel 2 g: 2 g | TOPICAL | @ 21:00:00

## 2022-11-04 MED ADMIN — diazePAM (VALIUM) tablet 5 mg: 5 mg | ORAL | @ 09:00:00 | Stop: 2022-11-04

## 2022-11-04 MED ADMIN — diclofenac sodium (VOLTAREN) 1 % gel 2 g: 2 g | TOPICAL | @ 09:00:00

## 2022-11-04 MED ADMIN — promethazine (PHENERGAN) 6.5 mg in sodium chloride (NS) 0.9 % 50 mL IVPB: 6.5 mg | INTRAVENOUS | @ 21:00:00

## 2022-11-04 MED ADMIN — pantoprazole (Protonix) EC tablet 40 mg: 40 mg | ORAL | @ 15:00:00 | Stop: 2022-11-04

## 2022-11-04 MED ADMIN — HYDROmorphone (PF) (DILAUDID) injection 0.5 mg: .5 mg | INTRAVENOUS | @ 16:00:00 | Stop: 2022-11-08

## 2022-11-04 MED ADMIN — famotidine (PEPCID) tablet 20 mg: 20 mg | ORAL | @ 02:00:00

## 2022-11-04 MED ADMIN — promethazine (PHENERGAN) 6.5 mg in sodium chloride (NS) 0.9 % 50 mL IVPB: 6.5 mg | INTRAVENOUS | @ 11:00:00

## 2022-11-04 MED ADMIN — ceFAZolin (ANCEF) IVPB 2 g in 50 ml dextrose (premix): 2 g | INTRAVENOUS | @ 21:00:00 | Stop: 2022-11-09

## 2022-11-04 MED ADMIN — ondansetron (ZOFRAN) injection 4 mg: 4 mg | INTRAVENOUS | @ 04:00:00

## 2022-11-04 NOTE — Unmapped (Signed)
Alert & oriented x4, 2L/[place], NG left nare w/TF infusing, up w/assist to bathroom x1, ongoing abd pain, nausea, resolved by meds see MAR for details. Additional c/o pain lower back; repositioned for comfort. Care, comfort, and safety maintained. Bed in lowest position and locked w/side rail up x2. Bedside table and call light within reach.     Problem: Adult Inpatient Plan of Care  Goal: Plan of Care Review  Outcome: Ongoing - Unchanged  Flowsheets (Taken 11/04/2022 0243)  Progress: improving  Plan of Care Reviewed With: patient  Goal: Patient-Specific Goal (Individualized)  Outcome: Ongoing - Unchanged  Goal: Absence of Hospital-Acquired Illness or Injury  Outcome: Ongoing - Unchanged  Goal: Optimal Comfort and Wellbeing  Outcome: Ongoing - Unchanged  Goal: Readiness for Transition of Care  Outcome: Ongoing - Unchanged  Goal: Rounds/Family Conference  Outcome: Ongoing - Unchanged     Problem: Latex Allergy  Goal: Absence of Allergy Symptoms  Outcome: Ongoing - Unchanged     Problem: Fall Injury Risk  Goal: Absence of Fall and Fall-Related Injury  Outcome: Ongoing - Unchanged     Problem: Skin Injury Risk Increased  Goal: Skin Health and Integrity  Outcome: Ongoing - Unchanged     Problem: Self-Care Deficit  Goal: Improved Ability to Complete Activities of Daily Living  Outcome: Ongoing - Unchanged     Problem: Comorbidity Management  Goal: Blood Glucose Levels Within Targeted Range  Outcome: Ongoing - Unchanged  Goal: Blood Pressure in Desired Range  Outcome: Ongoing - Unchanged  Problem: Nausea and Vomiting  Goal: Nausea and Vomiting Relief  Outcome: Ongoing - Unchanged     Problem: Infection  Goal: Absence of Infection Signs and Symptoms  Outcome: Ongoing - Unchanged     Problem: Pain Acute  Goal: Optimal Pain Control and Function  Outcome: Ongoing - Unchanged     Problem: Malnutrition  Goal: Improved Nutritional Intake  Outcome: Ongoing - Unchanged     Problem: Malnutrition  Goal: Improved Nutritional Intake  Outcome: Ongoing - Unchanged

## 2022-11-04 NOTE — Unmapped (Signed)
[facility]  Follow-Up Psychiatry Consult Note      Date of admission: 09/25/2022 10:30 AM  Service Date: November 04, 2022  Primary Team: [facility])  LOS:  LOS: 38 days      Assessment:   [name] [name] is a 22 y.o. 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 and reported past psych history of GAD and MDD, admitted 09/25/2022 10:30 AM for other acute postprocedural pain following injection of desmoid tumor with steroid and anesthetic under ultrasound guidance.  Patient was seen in consultation by request of hospitalist on [facility] for evaluation of anxiety and depression.      [name] [name] presents with symptoms consistent with a diagnosis of GAD and MDD, consistent with her historical diagnoses. Symptoms have worsened in the context of prolonged medical hospitalization, with increased anxiety/panic attacks.     Today, patient reports feeling frustrated due to a physical exam conducted on the previous day, endorsing new pain in her lower back and hip. She expresses desire to continue ambulating and making progress while hospitalized, though recent roadblocks have made her feel tired about wanting to continue medical procedures. She noted increased anxiety at night prior to bed, along with some stiffness in her muscles. Would recommend changing valium as detailed below to target nighttime anxiety and muscle stiffness. We will continue to follow.    Diagnoses:   Active Hospital problems:  Principal Problem:    Other acute postprocedural pain  Active Problems:    [name]    Intestinal polyps    [name]    Neoplasm related pain    Physical deconditioning    History of colectomy    Intractable abdominal pain    Generalized anxiety disorder with panic attacks    SBO (small bowel obstruction) (CMS-HCC)    Small intestinal bacterial overgrowth (SIBO)       Problems edited/added by me:  No problems updated.    Risk Assessment:  ASQ screening result: not completed    -A full risk assessment was previously performed on 7/31.  Risk assessment remains essentially unchanged.    Current suicide risk: low risk  Current homicide risk: low risk      Recommendations:     Safety and Observation Level:   -- This patient is not currently under IVC. If safety concerns arise, please page psychiatry for an evaluation.    Medications:  -- CHANGE valium to 2.5 mg q0900 + 5 mg q1400 + 7.5 mg q2100 to target anxiety at night. (Overall same 15 mg dose)  -- continue mirtazapine 15mg  q2200 for mood and anxiety  -- STOP lorazepam 1mg  IV BID PRN for anxiety and nausea (home dose of 0.5mg  TID)    Further Work-up:   -- none at this time.    Behavioral / Environmental:   -- Utilize compassion and acknowledge the patient's experiences while setting clear and realistic expectations for care.     Follow-up:  -- When patient is discharged, please ensure that their AVS includes information about the 80 Suicide & Crisis Lifeline.  -- The patient currently receives mental health care with [facility] and we will attempt to coordinate followup with our psychiatry team on discharge. She is also recently established with a virtual DBT group at [facility] ([employer] LCSW) although it is unclear what the status of this group is   -- We will follow as needed at this time.  Thank you for this consult request. Recommendations have been communicated to the primary team. Please page [phone]  for any questions or concerns.     Scribe's Attestation: [name] Maxwell, MD obtained and performed the history, physical exam and medical decision making elements that were entered into the chart. Signed by Eyvonne Left, Scribe, November 04, 2022 8:21 AM      ----------------------------------------------------------------------------------------------------------------------  November 04, 2022 4:29 PM. Documentation assistance provided by the Scribe. I was present during the time the encounter was recorded. The information recorded by the Scribe was done at my direction and has been reviewed and validated by me.    [name] Maxwell, MD  ----------------------------------------------------------------------------------------------------------------------        Subjective   Relevant events since last seen by psychiatry on 8/23:   - Pt complained about nausea and abdominal discomfort, tolerated with PRN medications.  - Pt complained of SOB on 8/26.   - Pt reported some stiffness, though was able to ambulate on 8/24.  - PT/OT tolerated well, though PT limited by fatigue at times.  - EKG (8/24): QTC 437 ms, HR 74 BPM, Fredericia 422 ms.  - 8/27 Labs: K 3.9    PRNs:  - Lorazepam 1 mg BID IV PRN: 8/23 @ 1156    Patient Interview:  Pt lying in bed awake, mother present. Reports that things have been rough for her recently, stating her current hospitalization has been mentally and physically challenging. Pt feels like she is in a time loop, given her recent negative experiences. Discussed unexpected and complicated nature of her hospitalization and her struggles with providers, including a challenging physical exam yesterday which left her hurting today. Pt endorses that diazepam was finally working well for her muscle stiffness. She wants to make progress, and asked PT earlier if she would be able to get out of bed, however she feels that events occur that set her back. Expresses her desire to not be in the hospital, citing her nutritional problems being another factor that she has to consider. She wishes the situation could have been managed differently, stating this is the roughest time she has ever had during hospitalization, since she advocated and fought for her health for the first time. She is used to having great medical staff in her previous outpatient units, and her current hospitalization is not similar at all. Also cites her medical literacy as being a reason she is able to advocate for her care better. Discusses how she feels tired and wants to give up after taking so many hits to her health, and how her quality of life has worsened significantly. Pt starts to get tearful, stating she feels exhausted with living her life so sick and in the hospital. MD provides validation for pt's feelings.     Pt endorses having a lot of anxiety, and medical PTSD over the past years, along with depressive episodes. States her family is a big reason to continue, and her mom has given up her life to help pt, and her dad and sister work hard such that pt can go through her treatments, along with religion is a big part of how she is able to continue, including members of her church, who help her reframe her situation. 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 MD provides validation and gives recommendations to reach out to friends. Reports she has the most support this hospital stay. Discusses how her sickness impacts her relationships in a negative manner, making it harder  for her to reach out, also stating that people might not understand her situation. Pt's best friend from college is visiting her today from [place]. Typically puts herself together before a visit, and does not know why, and she feels like she had a problem with making herself look good more recently, due to her weight gain and medical devices making her sickness visible on the outside. Pt wonders if she can take valium and hydroxyzine. Endorses feelings of nervousness, not wanting to add anything new, states that she is going to have a mental breakdown if urine retention becomes a problem. Valium helped her sleep and anxiety. Pt is agreeable to split valium through the day, and would like a 0900 + 1400 + 2100 split, as it helps her sleep. Her medication runs out before falling asleep, which is where she has the worst anxiety. Excited that she does not need to be in MPCU.     ROS:   All systems reviewed as negative/unremarkable aside from the following pertinent positives and negatives: ongoing anxiety    Collateral:   - Reviewed medical records in Epic  - Spoke with mother: States this is the worst hospitalization for pt. Pt attempts to hide how sick she is, and that can create a barrier. Asks if hydroxyzine is safe to try again. Pt does not sleep. Staff sometimes speaks to her rather than pt, and feels like staff does not think pt can advocate for herself.     Relevant Updates to past psychiatric, medical/surgical, family, or social history: n/a    Current Medications:  Scheduled Meds:   baclofen  20 mg Oral TID    buprenorphine-naloxone  0.25 Film Sublingual TID    carboxymethylcellulose sodium  1 drop Both Eyes QID    ceFAZolin  2 g Intravenous Q8H    cetirizine  10 mg Enteral tube: gastric Daily    [START ON 11/05/2022] diazePAM  2.5 mg Oral Daily    And    diazePAM  5 mg Oral Daily    And    diazePAM  7.5 mg Oral Nightly    diclofenac sodium  2 g Topical QID    enoxaparin (LOVENOX) injection  40 mg Subcutaneous Q24H SCH    famotidine  20 mg Oral Nightly    fluconazole (DIFLUCAN) INTRAVENOUS  400 mg Intravenous Q24H    gabapentin  100 mg Oral TID    mirtazapine  15 mg Enteral tube: gastric Nightly    naloxegol  12.5 mg Oral Daily    [Provider Hold] nirogacestat  50 mg Oral BID    pantoprazole  40 mg Oral BID     Continuous Infusions:   Chemo Clarification Order      sodium chloride       PRN Meds:.acetaminophen, alum-mag-simeth, bisacodyl, calcium carbonate, carboxymethylcellulose sodium, Chemo Clarification Order, guaiFENesin, hydrocortisone, HYDROmorphone, lidocaine, lidocaine-diphenhydrAMINE-aluminum-magnesium, ondansetron, oxyCODONE **OR** oxyCODONE, phenol, polyethylene glycol, promethazine, pseudoephedrine, senna, zinc oxide-cod liver oil      Objective:   Vital signs:   Temp:  [36.6 ??C (97.9 ??F)-36.8 ??C (98.2 ??F)] 36.6 ??C (97.9 ??F)  Heart Rate:  [72-150] 150  Resp:  [16-18] 18  BP: (102-125)/(57-79) 125/76  MAP (mmHg):  [71-90] 90  SpO2:  [100 %] 100 %    Physical Exam:  Gen: No acute distress.    Mental Status Exam:  Appearance:  appears stated age   Attitude:   calm, cooperative   Behavior/Psychomotor:  appropriate eye contact and no abnormal movements   Speech/Language:  normal rate, not pressured, normal volume, normal fluency. normal articulation   Mood:  ???Frustrated, tired???   Affect:  mood congruent and euthymic   Thought process:  logical, linear, clear, coherent, goal directed   Thought content:    denies thoughts of self-harm. Denies SI, plans, or intent. Denies HI.  No grandiose, self-referential, persecutory, or paranoid delusions noted.   Perceptual disturbances:   denies auditory and visual hallucinations   Attention:  able to attend to interview without fluctuations in consciousness   Concentration:  Able to fully concentrate and attend   Orientation:  grossly oriented.   Memory:  not formally tested, but grossly intact   Fund of knowledge:   not formally assessed   Insight:    Intact   Judgment:   Intact   Impulse Control:  Intact     Relevant laboratory/imaging data was reviewed.    Additional Psychometric Testing:  Not applicable.    Consult Type and Time-Based Documentation:  This patient was evaluated in person.    Time-based billing disclaimer:  I personally spent 80   minutes face-to-face and non-face-to-face in the care of this patient, which includes all pre, intra, and post visit time on the date of service.  All documented time was specific to the E/M visit and does not include any procedures that may have been performed.

## 2022-11-04 NOTE — Unmapped (Signed)
Hospitalist Daily Progress Note     LOS: 38 days under inpatient status    Assessment/Plan:  Principal Problem:    Other acute postprocedural pain  Active Problems:    [name]    Intestinal polyps    [name]    Neoplasm related pain    Physical deconditioning    History of colectomy    Intractable abdominal pain    Generalized anxiety disorder with panic attacks    SBO (small bowel obstruction) (CMS-HCC)    Small intestinal bacterial overgrowth (SIBO)         *Enterobacteriaceae group bacteremia, C albicans fungemia.  Resolving.  Likely due to RIJ tunneled line placed 01/2022 now removed.  8/13, 8/17 repeat blood cultures NGTD.  No evidence of vitreitis, retinitis 8/15 per Ophtho.  TTE 8/15 w possible vegetations on MV and TV or indwelling catheter; however, TEE 8/20 w/o vegetations.  (Cefepime 8/13-8/20, metronidazole 8/13-8/16, micafungin 8/14-8/22)  -Continue cefazolin (started 8/20), fluconazole (IV given difficulty tolerating PO form) through 8/31.  -Check CBC w diff weekly, CMP daily while on above.     *[name], desmoid fibromatosis.  H/O proctocolectomy, IPAA.  Follows w Dr. Theone [facility]).  S/P planned triamcinolone/bupivacaine injection of 2 RUE desmoid tumors, L chest tumor by IR 7/18, complicated by post-op pain which was initial reason for this stay.  Have intermittently held home nirogacestat for various reasons this stay.  -Can restart home nirogacestat at any time after personal communication w Dr. [name] 8/26 at 50mg  BID as before.  Can then titrate up to 100mg  BID after 1 week and then to goal 150mg  BID after 1 week per his rec (even though starting at goal dose of 150mg  BID is standard).     *Nausea.  Improving but not resolved.  Ileus resolved, needed NG decompression earlier this stay w TPN 7/21-8/13.  Baseline necessary opioid use.  H/O TPN dependence.  -Continue ND tube feeds (Osmolite 1.5 62ml/h, free water flush 50ml q3h) though probably needs closer to H2O q3h instead of 50ml q3h.  She will try to take more water PO.  -Continue bisacodyl, PEG, senna PRN for now as is having stools spontaneously.  -Continue naloxegol.  -Allowing IV promethazine PRN.     *Generalized anxiety disorder, depression, h/o panic attacks.  -Continue home mirtazapine.  -Since using diazepam (see below), will use this to treat anxiety as well after discussion w Psych.  Will give TID but at 2.5/5/7.5 after discussing w her and Psych.  -If needed could give 2.5mg  PO diazepam for acute anxiety.  -Appreciate Psych???s assistance.     *Chronic abd pain.  Follows w [facility].  Typically on oxycodone 10mg  PRN at home.  Has successfully had ketamine and lidocaine infusions for pain.  S/P lidocaine 7/21-7/24, ketamine 8/1-8/9.  -Continue home buprenorphine-naloxone 0.5-0.125 TID.  -Continue oxycodone 10-15mg  q4h PRN for now.  -Allowing hydromorphone 0.5mg  q3h PRN IV for now given nausea issues and difficulty taking PO meds.  -Continue gabapentin.     *Muscle spasm, stiffness, deconditioning.  MRI C, T, L-spine and MRI brain all w and w/o contrast unremarkable.  7/20 paraneoplastic Ab panel w slightly elevated P/Q-type Ca channel Ab which can be seen w [facility].  However, EMG NL.  Autoimmune myelopathy panel neg, including neg for anti-GAD65 Ab's making stiff person syndrom unlikely.  -Continue baclofen for now but might consider starting to wean this.  -Gabapentin as above.  -Continue PT/OT.  -Continue diazepam (though was started  for possible SPS) as this seems to be helping w muscle stiffness as well as anxiety.     *Transaminase elevation.  Unclear etiology.  Initially thought to be 2/2 TPN or nirogacestat a LFT rise preceded starting fluconazole.  Cefazolin or fluconazole could be culprits.  Liver U/S w Doppler 8/15 unremarkable.  -Monitor.     *Oral candidiasis.  Resolved.     *Fe def anemia.  S/P 1.02g IV Fe (ferumoxytol).  H/O Fe hypersensitivity reactions and evaluated by [facility].     *SIBO. Typically on cefdinir MWF, doxy TTSS at home.     *Coffee-ground emesis earlier this stay likely 2/2 NGT.  -Continue PPI BID for now.     *H/O DVT.     *DVT prophylaxis.  -Continue enoxaparin (noted that she is declining this at times).     *Dispo.  Pending above.  AIR referrals sent; she is very interested in AIR due to motivation to get back to baseline as quickly as able.  RIJ central line in place (placed 8/14).  Has gone home with Corpak before (12/2021); would probably be able to go to AIR w this in my discussion w them as long as there is a plan regarding what to do with Corpak on DC (e.g., leaving it in on DC).  She would be able to transfer to AIR with a central line in place per AIR.    Items needed per AIR:  -Corpak/feeding plan.  May need to ask GI for assistance w following this before transfer to AIR as has been followed by Dr. [name] as outpt when previously on tube feeds via Corpak at home and also when previously on TPN at home.  -Nirogacestat restarted prior to transfer to AIR.  Plan as above would be to start at 50mg  BID and titrate upwards to 100mg  BID after 1 week and then 150mg  BID after 1 week.           Please page the [facility] at [phone] with questions.              Consultants:   1. Neuro (off)  2. Chronic Pain (off)  3. GI (off)  4. Psych  5. PM&R  6. Lower GI Surg (off)  7. ID (off)  8. Ophtho (off)         Subjective:   Promethazine x 2, oxycodone x 1, ondansetron x 2, hydromorphone x 4 in last 24 hrs.  More soreness esp in back today.        Objective:   Physical Exam:  General: Lying in bed, no acute distress.   Eyes: No conjunctival injection or icterus.  Pupils equal.   ENT: Mucous membranes moist.  Normal appearing ears, nose.   Neck: Midline trachea.  No obvious goiter.   Respiratory: Normal respiratory effort.     Cardiovascular: No lower extremity edema.   Gastrointestinal: Nondistended.   Skin: No obvious rashes or breakdown.  Warm, dry.   Musculoskeletal: Needs assistance to sit up but able to support self when sitting.  No muscle spasm in lower back though noted that there is more swelling in sacral area (no fluid on POCUS by me).  Known fibromatosis of L anterior thigh unchanged.   Psychiatric: Alert.  Answers questions appropriately.  Judgment and insight intact.   Neurologic: Extraocular movements intact, no facial asymmetry.  No gross sensory deficits.       Vital signs in last 24 hours:  Temp:  [36.5 ??C (97.7 ??F)-36.8 ??  C (98.2 ??F)] 36.8 ??C (98.2 ??F)  Heart Rate:  [67-87] 87  Resp:  [16-17] 17  BP: (94-114)/(57-79) 102/57  MAP (mmHg):  [70-88] 71  SpO2:  [100 %] 100 %    Intake/Output last 24 hours:    Intake/Output Summary (Last 24 hours) at 11/04/2022 0414  Last data filed at 11/04/2022 0118  Gross per 24 hour   Intake 890 ml   Output 3400 ml   Net -2510 ml       Most recent recorded weights:  Last 5 Recorded Weights    09/29/22 1300 10/06/22 1021 10/09/22 1730 10/11/22 0530   Weight: 62.1 kg (137 lb) 60.8 kg (134 lb 1.6 oz) 61 kg (134 lb 6.4 oz) 60.2 kg (132 lb 11.5 oz)    10/26/22 1730   Weight: 54 kg (119 lb)       Medications:   Scheduled Meds:   baclofen  20 mg Oral TID    buprenorphine-naloxone  0.25 Film Sublingual TID    carboxymethylcellulose sodium  1 drop Both Eyes QID    ceFAZolin  2 g Intravenous Q8H    cetirizine  10 mg Enteral tube: gastric Daily    diazePAM  5 mg Oral TID    diclofenac sodium  2 g Topical QID    enoxaparin (LOVENOX) injection  40 mg Subcutaneous Q24H SCH    famotidine  20 mg Oral Nightly    fluconazole (DIFLUCAN) INTRAVENOUS  400 mg Intravenous Q24H    gabapentin  100 mg Oral TID    mirtazapine  15 mg Enteral tube: gastric Nightly    naloxegol  12.5 mg Oral Daily    [Provider Hold] nirogacestat  50 mg Oral BID    pantoprazole  40 mg Oral BID     Continuous Infusions:   Chemo Clarification Order      IP okay to treat      sodium chloride           ADMINISTRATIVE  50  minutes total spent on this encounter, including preparing to see the patient, seeing and examining the patient, counseling the patient, counseling family, placing orders, discussing the patient with other providers, documenting the encounter, and coordinating care.

## 2022-11-04 NOTE — Unmapped (Signed)
Pt. Is a 22 year old female, full code, on RA A/Ox4.  Pt. Has family at bedside. Complained of pain in abd. And lower back, gave prn daludid. IV site clean, dry, intact. Complained of nausea, gave prn zosyn.  VSS throughout the shift.     Problem: Adult Inpatient Plan of Care  Goal: Plan of Care Review  Outcome: Progressing  Flowsheets (Taken 11/03/2022 1814)  Progress: improving  Plan of Care Reviewed With: patient  Goal: Patient-Specific Goal (Individualized)  Outcome: Progressing  Goal: Absence of Hospital-Acquired Illness or Injury  Outcome: Progressing  Intervention: Identify and Manage Fall Risk  Recent Flowsheet Documentation  Taken 11/03/2022 1814 by Vita Barley, RN  Safety Interventions:   environmental modification   fall reduction program maintained   family at bedside   lighting adjusted for tasks/safety   low bed   nonskid shoes/slippers when out of bed  Taken 11/03/2022 1800 by Vita Barley, RN  Safety Interventions:   fall reduction program maintained   family at bedside   environmental modification   low bed  Taken 11/03/2022 1600 by Vita Barley, RN  Safety Interventions:   fall reduction program maintained   low bed  Taken 11/03/2022 1400 by Vita Barley, RN  Safety Interventions:   fall reduction program maintained   family at bedside   low bed  Taken 11/03/2022 1200 by Vita Barley, RN  Safety Interventions:   family at bedside   fall reduction program maintained   low bed  Taken 11/03/2022 1000 by Vita Barley, RN  Safety Interventions:   low bed   family at bedside  Taken 11/03/2022 0730 by Vita Barley, RN  Safety Interventions:   low bed   family at bedside  Intervention: Prevent and Manage VTE (Venous Thromboembolism) Risk  Recent Flowsheet Documentation  Taken 11/03/2022 0730 by Vita Barley, RN  Anti-Embolism Intervention: (lovenox subq) Other (Comment)  Goal: Optimal Comfort and Wellbeing  Outcome: Progressing  Intervention: Monitor Pain and Promote Comfort  Flowsheets (Taken 11/03/2022 1814)  Pain Management Interventions:   around-the-clock dosing utilized   care clustered   medication offered   pain management plan reviewed with patient/caregiver   pillow support provided   position adjusted   quiet environment facilitated  Intervention: Provide Person-Centered Care  Flowsheets (Taken 11/03/2022 1814)  Trust Relationship/Rapport:   care explained   choices provided   emotional support provided   empathic listening provided   questions answered   questions encouraged   reassurance provided   thoughts/feelings acknowledged  Goal: Readiness for Transition of Care  Outcome: Progressing  Goal: Rounds/Family Conference  Outcome: Progressing  Flowsheets (Taken 11/03/2022 1814)  Participants:   patient   nursing   family     Problem: Latex Allergy  Goal: Absence of Allergy Symptoms  Outcome: Progressing     Problem: Fall Injury Risk  Goal: Absence of Fall and Fall-Related Injury  Outcome: Progressing  Intervention: Identify and Manage Contributors  Flowsheets (Taken 11/03/2022 1814)  Medication Review/Management: medications reviewed  Self-Care Promotion:   independence encouraged   adaptive equipment use encouraged  Intervention: Promote Injury-Free Environment  Flowsheets  Taken 11/03/2022 1814  Safety Interventions:   environmental modification   fall reduction program maintained   family at bedside   lighting adjusted for tasks/safety   low bed   nonskid shoes/slippers when out of bed  Taken 11/03/2022 1800  Safety Interventions:   fall reduction program maintained   family at bedside   environmental modification   low  bed  Taken 11/03/2022 1600  Safety Interventions:   fall reduction program maintained   low bed  Taken 11/03/2022 1400  Safety Interventions:   fall reduction program maintained   family at bedside   low bed  Taken 11/03/2022 1200  Safety Interventions:   family at bedside   fall reduction program maintained   low bed  Taken 11/03/2022 1000  Safety Interventions:   low bed family at bedside  Taken 11/03/2022 0730  Safety Interventions:   low bed   family at bedside     Problem: Skin Injury Risk Increased  Goal: Skin Health and Integrity  Outcome: Progressing     Problem: Self-Care Deficit  Goal: Improved Ability to Complete Activities of Daily Living  Outcome: Progressing  Intervention: Promote Activity and Functional Independence  Recent Flowsheet Documentation  Taken 11/03/2022 1814 by Vita Barley, RN  Self-Care Promotion:   independence encouraged   adaptive equipment use encouraged     Problem: Comorbidity Management  Goal: Blood Glucose Levels Within Targeted Range  Outcome: Progressing  Intervention: Monitor and Manage Glycemia  Recent Flowsheet Documentation  Taken 11/03/2022 1814 by Vita Barley, RN  Medication Review/Management: medications reviewed  Goal: Blood Pressure in Desired Range  Outcome: Progressing  Intervention: Maintain Blood Pressure Management  Recent Flowsheet Documentation  Taken 11/03/2022 1814 by Vita Barley, RN  Medication Review/Management: medications reviewed     Problem: Nausea and Vomiting  Goal: Nausea and Vomiting Relief  Outcome: Progressing  Intervention: Prevent and Manage Nausea and Vomiting  Flowsheets (Taken 11/03/2022 1814)  Nausea/Vomiting Interventions:   sips of clear liquids given   stimuli minimized  Environmental Support:   calm environment promoted   caregiver consistency promoted     Problem: Infection  Goal: Absence of Infection Signs and Symptoms  Outcome: Progressing     Problem: Pain Acute  Goal: Optimal Pain Control and Function  Outcome: Progressing  Intervention: Develop Pain Management Plan  Flowsheets (Taken 11/03/2022 1814)  Pain Management Interventions:   around-the-clock dosing utilized   care clustered   medication offered   pain management plan reviewed with patient/caregiver   pillow support provided   position adjusted   quiet environment facilitated  Intervention: Prevent or Manage Pain  Recent Flowsheet Documentation  Taken 11/03/2022 1814 by Vita Barley, RN  Medication Review/Management: medications reviewed

## 2022-11-04 NOTE — Unmapped (Signed)
Hi,     Ileene Patrick contacted the Communication Center requesting to speak with the care team of [name] [name] to discuss:    [facility] needs a provider statement for patient to be able to end lease early without penalty. Advised to contact Nurse Lance Bosch.    Please contact Ms. Dayes  at [phone].    Thank you,   Kelli Hope  [facility]   [phone]

## 2022-11-05 LAB — COMPREHENSIVE METABOLIC PANEL
ALBUMIN: 3.3 g/dL — ABNORMAL LOW (ref 3.4–5.0)
ALKALINE PHOSPHATASE: 170 U/L — ABNORMAL HIGH (ref 46–116)
ALT (SGPT): 103 U/L — ABNORMAL HIGH (ref 10–49)
ANION GAP: 6 mmol/L (ref 5–14)
AST (SGOT): 110 U/L — ABNORMAL HIGH (ref <=34)
BILIRUBIN TOTAL: 0.2 mg/dL — ABNORMAL LOW (ref 0.3–1.2)
BLOOD UREA NITROGEN: 7 mg/dL — ABNORMAL LOW (ref 9–23)
BUN / CREAT RATIO: 16
CALCIUM: 9.3 mg/dL (ref 8.7–10.4)
CHLORIDE: 105 mmol/L (ref 98–107)
CO2: 30 mmol/L (ref 20.0–31.0)
CREATININE: 0.43 mg/dL — ABNORMAL LOW
EGFR CKD-EPI (2021) FEMALE: >90 mL/min/{1.73_m2} (ref >=60)
GLUCOSE RANDOM: 119 mg/dL (ref 70–179)
POTASSIUM: 3.9 mmol/L (ref 3.4–4.8)
PROTEIN TOTAL: 6.2 g/dL (ref 5.7–8.2)
SODIUM: 141 mmol/L (ref 135–145)

## 2022-11-05 MED ADMIN — buprenorphine-naloxone (SUBOXONE) 2-0.5 mg SL film 0.5 mg of buprenorphine: .25 | SUBLINGUAL | @ 12:00:00

## 2022-11-05 MED ADMIN — baclofen (LIORESAL) tablet 20 mg: 20 mg | ORAL | @ 19:00:00

## 2022-11-05 MED ADMIN — gabapentin (NEURONTIN) capsule 100 mg: 100 mg | ORAL | @ 19:00:00

## 2022-11-05 MED ADMIN — ceFAZolin (ANCEF) IVPB 2 g in 50 ml dextrose (premix): 2 g | INTRAVENOUS | @ 03:00:00 | Stop: 2022-11-09

## 2022-11-05 MED ADMIN — baclofen (LIORESAL) tablet 20 mg: 20 mg | ORAL

## 2022-11-05 MED ADMIN — ceFAZolin (ANCEF) IVPB 2 g in 50 ml dextrose (premix): 2 g | INTRAVENOUS | @ 12:00:00 | Stop: 2022-11-09

## 2022-11-05 MED ADMIN — mirtazapine (REMERON) tablet 15 mg: 15 mg | GASTROENTERAL | @ 03:00:00

## 2022-11-05 MED ADMIN — gabapentin (NEURONTIN) capsule 100 mg: 100 mg | ORAL

## 2022-11-05 MED ADMIN — HYDROmorphone (PF) (DILAUDID) injection 0.5 mg: .5 mg | INTRAVENOUS | @ 03:00:00 | Stop: 2022-11-08

## 2022-11-05 MED ADMIN — ondansetron (ZOFRAN) injection 4 mg: 4 mg | INTRAVENOUS

## 2022-11-05 MED ADMIN — buprenorphine-naloxone (SUBOXONE) 2-0.5 mg SL film 0.5 mg of buprenorphine: .25 | SUBLINGUAL

## 2022-11-05 MED ADMIN — pantoprazole (Protonix) EC tablet 40 mg: 40 mg | ORAL | @ 12:00:00

## 2022-11-05 MED ADMIN — carboxymethylcellulose sodium (REFRESH CELLUVISC) 1 % ophthalmic gel 1 drop: 1 [drp] | OPHTHALMIC

## 2022-11-05 MED ADMIN — multivitamin with folic acid 400 mcg tablet 1 tablet: 1 | ORAL | @ 19:00:00

## 2022-11-05 MED ADMIN — carboxymethylcellulose sodium (REFRESH CELLUVISC) 1 % ophthalmic gel 1 drop: 1 [drp] | OPHTHALMIC | @ 17:00:00

## 2022-11-05 MED ADMIN — promethazine (PHENERGAN) 6.5 mg in sodium chloride (NS) 0.9 % 50 mL IVPB: 6.5 mg | INTRAVENOUS | @ 03:00:00

## 2022-11-05 MED ADMIN — baclofen (LIORESAL) tablet 20 mg: 20 mg | ORAL | @ 12:00:00

## 2022-11-05 MED ADMIN — cetirizine (ZYRTEC) tablet 10 mg: 10 mg | GASTROENTERAL | @ 12:00:00

## 2022-11-05 MED ADMIN — HYDROmorphone (PF) (DILAUDID) injection 0.5 mg: .5 mg | INTRAVENOUS | @ 12:00:00 | Stop: 2022-11-08

## 2022-11-05 MED ADMIN — buprenorphine-naloxone (SUBOXONE) 2-0.5 mg SL film 0.5 mg of buprenorphine: .25 | SUBLINGUAL | @ 19:00:00

## 2022-11-05 MED ADMIN — HYDROmorphone (PF) (DILAUDID) injection 0.5 mg: .5 mg | INTRAVENOUS | Stop: 2022-11-08

## 2022-11-05 MED ADMIN — ceFAZolin (ANCEF) IVPB 2 g in 50 ml dextrose (premix): 2 g | INTRAVENOUS | @ 21:00:00 | Stop: 2022-11-09

## 2022-11-05 MED ADMIN — gabapentin (NEURONTIN) capsule 100 mg: 100 mg | ORAL | @ 12:00:00

## 2022-11-05 MED ADMIN — diclofenac sodium (VOLTAREN) 1 % gel 2 g: 2 g | TOPICAL | @ 21:00:00

## 2022-11-05 MED ADMIN — diazePAM (VALIUM) tablet 5 mg: 5 mg | ORAL | @ 19:00:00

## 2022-11-05 MED ADMIN — diazePAM (VALIUM) tablet 2.5 mg: 2.5 mg | ORAL | @ 12:00:00

## 2022-11-05 MED ADMIN — HYDROmorphone (PF) (DILAUDID) injection 0.5 mg: .5 mg | INTRAVENOUS | @ 17:00:00 | Stop: 2022-11-08

## 2022-11-05 MED ADMIN — carboxymethylcellulose sodium (REFRESH CELLUVISC) 1 % ophthalmic gel 1 drop: 1 [drp] | OPHTHALMIC | @ 21:00:00

## 2022-11-05 MED ADMIN — fluconazole (DIFLUCAN) 2 mg/mL in sodium chloride 0.9% 400 mg: 400 mg | INTRAVENOUS | @ 12:00:00 | Stop: 2022-11-09

## 2022-11-05 MED ADMIN — promethazine (PHENERGAN) 6.5 mg in sodium chloride (NS) 0.9 % 50 mL IVPB: 6.5 mg | INTRAVENOUS | @ 12:00:00

## 2022-11-05 MED ADMIN — diclofenac sodium (VOLTAREN) 1 % gel 2 g: 2 g | TOPICAL | @ 17:00:00

## 2022-11-05 MED ADMIN — famotidine (PEPCID) tablet 20 mg: 20 mg | ORAL

## 2022-11-05 MED ADMIN — ondansetron (ZOFRAN) injection 4 mg: 4 mg | INTRAVENOUS | @ 21:00:00

## 2022-11-05 MED ADMIN — senna (SENOKOT) tablet 1 tablet: 1 | ORAL

## 2022-11-05 MED ADMIN — HYDROmorphone (PF) (DILAUDID) injection 0.5 mg: .5 mg | INTRAVENOUS | @ 21:00:00 | Stop: 2022-11-08

## 2022-11-05 MED ADMIN — diclofenac sodium (VOLTAREN) 1 % gel 2 g: 2 g | TOPICAL

## 2022-11-05 MED ADMIN — diazePAM (VALIUM) tablet 7.5 mg: 7.5 mg | ORAL

## 2022-11-05 MED ADMIN — naloxegol (MOVANTIK) 12.5 mg tablet 12.5 mg: 12.5 mg | ORAL | @ 12:00:00

## 2022-11-05 NOTE — Unmapped (Signed)
Hospitalist Daily Progress Note     LOS: 39 days under inpatient status    Assessment/Plan:  Principal Problem:    Other acute postprocedural pain  Active Problems:    [name]    Intestinal polyps    [name]    Neoplasm related pain    Physical deconditioning    History of colectomy    Intractable abdominal pain    Generalized anxiety disorder with panic attacks    SBO (small bowel obstruction) (CMS-HCC)    Small intestinal bacterial overgrowth (SIBO)         *Enterobacteriaceae group bacteremia, C albicans fungemia.  Resolving.  Likely due to RIJ tunneled line placed 01/2022 now removed.  8/13, 8/17 repeat blood cultures NGTD.  No evidence of vitreitis, retinitis 8/15 per Ophtho.  TTE 8/15 w possible vegetations on MV and TV or indwelling catheter; however, TEE 8/20 w/o vegetations.  (Cefepime 8/13-8/20, metronidazole 8/13-8/16, micafungin 8/14-8/22)  -Continue cefazolin (started 8/20), fluconazole (IV given difficulty tolerating PO form) through 8/31, even w slightly elevated LFTs.  -Check CBC w diff weekly, CMP daily while on above.     *[name], desmoid fibromatosis.  H/O proctocolectomy, IPAA.  Follows w Dr. Theone Stanley [facility]).  S/P planned triamcinolone/bupivacaine injection of 2 RUE desmoid tumors, L chest tumor by IR 7/18, complicated by post-op pain which was initial reason for this stay.  Have intermittently held home nirogacestat for various reasons this stay.  -Can restart home nirogacestat at any time after personal communication w Dr. [name] 8/26 at 50mg  BID as before.  Can then titrate up to 100mg  BID after 1 week and then to goal 150mg  BID after 1 week per his rec (even though starting at goal dose of 150mg  BID is standard).  Holding off on restarting given elevated LFTs.     *Nausea.  Improving but not resolved.  Ileus resolved, needed NG decompression earlier this stay w TPN 7/21-8/13.  Baseline necessary opioid use.  H/O TPN dependence.  -Continue ND tube feeds (Osmolite 1.5 7ml/h, free water flush 50ml q3h) though probably needs closer to H2O q3h instead of 50ml q3h if not taking fluids PO.  -Continue bisacodyl, PEG, senna PRN for now as is having stools spontaneously.  -Continue naloxegol.  -Allowing IV promethazine PRN.     *Generalized anxiety disorder, depression, h/o panic attacks.  -Continue home mirtazapine.  -Since using diazepam (see below), will use this to treat anxiety as well after discussion w Psych 8/27.  Will give TID but at 2.5/5/7.5 after discussing w her and Psych.  -If needed could give 2.5mg  PO diazepam for acute anxiety.  -Appreciate Psych???s assistance.     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  Follows w [facility].  Typically on oxycodone 10mg  PRN at home.  S/P lidocaine 7/21-7/24, ketamine 8/1-8/9.  -Continue home buprenorphine-naloxone 0.5-0.125 TID.  -Continue oxycodone 10-15mg  q4h PRN for now.  -Allowing hydromorphone 0.5mg  q3h PRN IV for now given nausea issues and difficulty taking PO meds at times.  -Continue gabapentin.     *Muscle spasm, stiffness, deconditioning.  MRI C, T, L-spine and MRI brain all w and w/o contrast unremarkable.  7/20 paraneoplastic Ab panel w slightly elevated P/Q-type Ca channel Ab which can be seen w [facility].  However, EMG NL.  Autoimmune myelopathy panel neg, including neg for anti-GAD65 Ab's making stiff person syndrom unlikely.  -Continue baclofen for now but might consider starting to wean this.  -Continue diazepam (though was started for  possible stiff person syndrome) as this seems to be helping w muscle stiffness as well as anxiety.  -Gabapentin as above.  -Continue PT/OT.     *Transaminase elevation.  Unclear etiology, possibly related to cefazolin or fluconazole.  Previous transaminase elevation thought to be 2/2 TPN or nirogacestat a LFT rise preceded starting fluconazole.  Liver U/S w Doppler 8/15 unremarkable.  -Continue to monitor daily.     *Oral candidiasis.  Resolved.     *Fe def anemia. S/P 1.02g IV Fe (ferumoxytol).  H/O Fe hypersensitivity reactions and evaluated by [facility].     *SIBO.  Typically on cefdinir MWF, doxy TTSS at home.     *Coffee-ground emesis earlier this stay likely 2/2 NGT.  -Continue PPI BID for now.     *H/O DVT.     *DVT prophylaxis.  Avoiding anticoag due to GI bleeding history though has tolerated apixaban 2.5mg  BID in past for DVT treatment.  -SCD's, ambulation when able.     *Dispo.  Pending above.  AIR referrals sent; she is very interested in AIR due to motivation to get back to baseline as quickly as able.  RIJ central line in place (placed 8/14).  Has gone home with Corpak before (12/2021); would probably be able to go to AIR w this in my discussion w them as long as there is a plan regarding what to do with Corpak on DC (e.g., leaving it in on DC vs removing it).  She would be able to transfer to AIR with a central line in place per AIR.       Items needed per AIR:  -Corpak/feeding plan.  May need to ask GI for assistance w following this before transfer to AIR as has been followed by Dr. [name] as outpt when previously on tube feeds via Corpak at home and also when previously on TPN at home.  Would not be surprised if needs this even upon DC from AIR.  -Nirogacestat restarted prior to transfer to AIR.  Plan as above would be to start at 50mg  BID and titrate upwards to 100mg  BID after 1 week and then 150mg  BID after 1 week.           Please page the [facility] at [phone] with questions.              Consultants:   1. Neuro (off)  2. Chronic Pain (off)  3. GI (off)  4. Psych  5. PM&R  6. Lower GI Surg (off)  7. ID (off)  8. Ophtho (off)          Subjective:   Hydromorphone x 5, ondansetron x 1, promethazine x 3, senna x 1 in last 24 hrs.  Rough day yesterday.  BMs yesterday.  Did very short 69ft walk w mobilization aide.       Objective:   Physical Exam:  General: Lying in bed, no acute distress.   Eyes: No conjunctival injection or icterus.     Neck: Midline trachea.     Respiratory: Normal respiratory effort.     Cardiovascular: Regular rhythm without murmur.  Radial pulses 2+ bilaterally.  No lower extremity edema.   Gastrointestinal: Bowel sounds present, soft, nondistended.   Skin: No obvious rashes or breakdown.  Warm, dry.   Musculoskeletal: No asymmetric motor strength.   Psychiatric: Alert.  Answers questions appropriately.  Judgment and insight intact.   Neurologic: Extraocular movements intact, no facial asymmetry.  No gross sensory deficits.  Vital signs in last 24 hours:  Temp:  [36.6 ??C (97.9 ??F)-37 ??C (98.6 ??F)] 37 ??C (98.6 ??F)  Heart Rate:  [72-150] 88  Resp:  [17-18] 18  BP: (104-125)/(58-76) 118/68  MAP (mmHg):  [73-90] 82  SpO2:  [100 %] 100 %    Intake/Output last 24 hours:    Intake/Output Summary (Last 24 hours) at 11/05/2022 0538  Last data filed at 11/04/2022 0900  Gross per 24 hour   Intake 0 ml   Output --   Net 0 ml       Most recent recorded weights:  Last 5 Recorded Weights    09/29/22 1300 10/06/22 1021 10/09/22 1730 10/11/22 0530   Weight: 62.1 kg (137 lb) 60.8 kg (134 lb 1.6 oz) 61 kg (134 lb 6.4 oz) 60.2 kg (132 lb 11.5 oz)    10/26/22 1730   Weight: 54 kg (119 lb)       Medications:   Scheduled Meds:   baclofen  20 mg Oral TID    buprenorphine-naloxone  0.25 Film Sublingual TID    carboxymethylcellulose sodium  1 drop Both Eyes QID    ceFAZolin  2 g Intravenous Q8H    cetirizine  10 mg Enteral tube: gastric Daily    diazePAM  2.5 mg Oral Daily    And    diazePAM  5 mg Oral Daily    And    diazePAM  7.5 mg Oral Nightly    diclofenac sodium  2 g Topical QID    enoxaparin (LOVENOX) injection  40 mg Subcutaneous Q24H SCH    famotidine  20 mg Oral Nightly    fluconazole (DIFLUCAN) INTRAVENOUS  400 mg Intravenous Q24H    gabapentin  100 mg Oral TID    mirtazapine  15 mg Enteral tube: gastric Nightly    naloxegol  12.5 mg Oral Daily    [Provider Hold] nirogacestat  50 mg Oral BID    pantoprazole  40 mg Oral BID     Continuous Infusions:   Chemo Clarification Order      sodium chloride           ADMINISTRATIVE  60  minutes total spent on this encounter, including preparing to see the patient, seeing and examining the patient, counseling the patient, counseling family, placing orders, discussing the patient with other providers, documenting the encounter, and coordinating care.

## 2022-11-05 NOTE — Unmapped (Signed)
Alert & oriented x4, 2L/[place], NG left nare w/TF infusing, up w/assist to bathroom x1, ongoing abd pain, nausea, resolved by meds see MAR for details. Additional c/o pain lower back; repositioned for comfort. Meds taken by mouth with significant time given simultaneously w/ meds to manage nausea. Care, comfort, and safety maintained. Bed in lowest position and locked w/side rail up x2. Bedside table and call light within reach.     Problem: Adult Inpatient Plan of Care  Goal: Plan of Care Review  Outcome: Ongoing - Unchanged  Flowsheets (Taken 11/05/2022 0543)  Progress: improving  Plan of Care Reviewed With: patient  Goal: Patient-Specific Goal (Individualized)  Outcome: Ongoing - Unchanged  Goal: Absence of Hospital-Acquired Illness or Injury  Outcome: Ongoing - Unchanged  Goal: Optimal Comfort and Wellbeing  Outcome: Ongoing - Unchanged  Goal: Readiness for Transition of Care  Outcome: Ongoing - Unchanged  Goal: Rounds/Family Conference  Outcome: Ongoing - Unchanged     Problem: Latex Allergy  Goal: Absence of Allergy Symptoms  Outcome: Ongoing - Unchanged     Problem: Fall Injury Risk  Goal: Absence of Fall and Fall-Related Injury  Outcome: Ongoing - Unchanged     Problem: Skin Injury Risk Increased  Goal: Skin Health and Integrity  Outcome: Ongoing - Unchanged     Problem: Self-Care Deficit  Goal: Improved Ability to Complete Activities of Daily Living  Outcome: Ongoing - Unchanged     Problem: Comorbidity Management  Goal: Blood Glucose Levels Within Targeted Range  Outcome: Ongoing - Unchanged  Goal: Blood Pressure in Desired Range  Outcome: Ongoing - Unchanged     Problem: Nausea and Vomiting  Goal: Nausea and Vomiting Relief  Outcome: Ongoing - Unchanged     Problem: Infection  Goal: Absence of Infection Signs and Symptoms  Outcome: Ongoing - Unchanged     Problem: Pain Acute  Goal: Optimal Pain Control and Function  Outcome: Ongoing - Unchanged     Problem: Malnutrition  Goal: Improved Nutritional Intake  Outcome: Ongoing - Unchanged

## 2022-11-05 NOTE — Unmapped (Addendum)
Pt is alert and oriented times 4 on 2L [place]. Prn pain medicine dilaudid given for abd. & back pain. Enteral feeding tube changed @1830 . VSS during shift. Pt. Tolerated medications by mouth today but phenergan was given for nausea. Family at bedside. HR rose to 150 during OT, but went down with sitting. Voiding regular, 1x assist to bathroom. No BM this shift. Fall reduction program maintained.     Problem: Adult Inpatient Plan of Care  Goal: Plan of Care Review  Outcome: Ongoing - Unchanged  Flowsheets (Taken 11/04/2022 1822)  Progress: improving  Plan of Care Reviewed With:   patient   parent  Note: Pt. Took meds PO.  Goal: Patient-Specific Goal (Individualized)  Outcome: Ongoing - Unchanged  Goal: Absence of Hospital-Acquired Illness or Injury  Outcome: Ongoing - Unchanged  Intervention: Identify and Manage Fall Risk  Recent Flowsheet Documentation  Taken 11/04/2022 1822 by Vita Barley, RN  Safety Interventions:   fall reduction program maintained   low bed   lighting adjusted for tasks/safety   environmental modification   enteral feeding safety   toileting scheduled  Taken 11/04/2022 1800 by Vita Barley, RN  Safety Interventions:   environmental modification   enteral feeding safety   fall reduction program maintained   family at bedside   low bed  Taken 11/04/2022 1600 by Vita Barley, RN  Safety Interventions:   enteral feeding safety   fall reduction program maintained   environmental modification   family at bedside  Taken 11/04/2022 1400 by Vita Barley, RN  Safety Interventions:   environmental modification   enteral feeding safety   family at bedside   fall reduction program maintained   low bed  Taken 11/04/2022 1200 by Vita Barley, RN  Safety Interventions:   environmental modification   enteral feeding safety   family at bedside   lighting adjusted for tasks/safety   low bed  Taken 11/04/2022 1000 by Vita Barley, RN  Safety Interventions:   environmental modification   fall reduction program maintained   enteral feeding safety   family at bedside   lighting adjusted for tasks/safety   low bed  Taken 11/04/2022 0730 by Vita Barley, RN  Safety Interventions:   aspiration precautions   enteral feeding safety   environmental modification   fall reduction program maintained   family at bedside   low bed   lighting adjusted for tasks/safety   nonskid shoes/slippers when out of bed  Intervention: Prevent Skin Injury  Recent Flowsheet Documentation  Taken 11/04/2022 1822 by Vita Barley, RN  Skin Protection: adhesive use limited  Intervention: Prevent and Manage VTE (Venous Thromboembolism) Risk  Recent Flowsheet Documentation  Taken 11/04/2022 1800 by Vita Barley, RN  Anti-Embolism Intervention: Off  Taken 11/04/2022 1600 by Vita Barley, RN  Anti-Embolism Intervention: Off  Taken 11/04/2022 1400 by Vita Barley, RN  Anti-Embolism Intervention: Off  Taken 11/04/2022 0730 by Vita Barley, RN  Anti-Embolism Intervention: (lovenox subq) Other (Comment)  Goal: Optimal Comfort and Wellbeing  Outcome: Ongoing - Unchanged  Goal: Readiness for Transition of Care  Outcome: Ongoing - Unchanged  Goal: Rounds/Family Conference  Outcome: Ongoing - Unchanged  Flowsheets (Taken 11/04/2022 1822)  Participants:   family   nursing   patient     Problem: Latex Allergy  Goal: Absence of Allergy Symptoms  Outcome: Ongoing - Unchanged     Problem: Fall Injury Risk  Goal: Absence of Fall and Fall-Related Injury  Outcome: Ongoing - Unchanged  Intervention: Promote Merchandiser, retail  Taken 11/04/2022 1822  Safety Interventions:   fall reduction program maintained   low bed   lighting adjusted for tasks/safety   environmental modification   enteral feeding safety   toileting scheduled  Taken 11/04/2022 1800  Safety Interventions:   environmental modification   enteral feeding safety   fall reduction program maintained   family at bedside   low bed  Taken 11/04/2022 1600  Safety Interventions: enteral feeding safety   fall reduction program maintained   environmental modification   family at bedside  Taken 11/04/2022 1400  Safety Interventions:   environmental modification   enteral feeding safety   family at bedside   fall reduction program maintained   low bed  Taken 11/04/2022 1200  Safety Interventions:   environmental modification   enteral feeding safety   family at bedside   lighting adjusted for tasks/safety   low bed  Taken 11/04/2022 1000  Safety Interventions:   environmental modification   fall reduction program maintained   enteral feeding safety   family at bedside   lighting adjusted for tasks/safety   low bed  Taken 11/04/2022 0730  Safety Interventions:   aspiration precautions   enteral feeding safety   environmental modification   fall reduction program maintained   family at bedside   low bed   lighting adjusted for tasks/safety   nonskid shoes/slippers when out of bed     Problem: Skin Injury Risk Increased  Goal: Skin Health and Integrity  Outcome: Ongoing - Unchanged  Intervention: Optimize Skin Protection  Flowsheets (Taken 11/04/2022 1822)  Activity Management:   ambulated in room   back to bed   sitting, edge of bed  Pressure Reduction Techniques: frequent weight shift encouraged  Head of Bed (HOB) Positioning: HOB at 30-45 degrees  Pressure Reduction Devices: positioning supports utilized  Skin Protection: adhesive use limited     Problem: Self-Care Deficit  Goal: Improved Ability to Complete Activities of Daily Living  Outcome: Ongoing - Unchanged     Problem: Comorbidity Management  Goal: Blood Glucose Levels Within Targeted Range  Outcome: Ongoing - Unchanged  Goal: Blood Pressure in Desired Range  Outcome: Ongoing - Unchanged     Problem: Nausea and Vomiting  Goal: Nausea and Vomiting Relief  Outcome: Ongoing - Unchanged     Problem: Infection  Goal: Absence of Infection Signs and Symptoms  Outcome: Ongoing - Unchanged     Problem: Pain Acute  Goal: Optimal Pain Control and Function  Outcome: Ongoing - Unchanged     Problem: Malnutrition  Goal: Improved Nutritional Intake  Outcome: Ongoing - Unchanged

## 2022-11-05 NOTE — Unmapped (Signed)
A/O x 4, afebrile, VSS on RA, PRN meds given for pain and nausea (see MAR). IJ clean, dry, intact, and flushed per protocol. VTE: SCDs. Falls precautions maintained. Call bell and bedside table within reach, bed in lowest position and locked. No falls or injuries noted this shift. Will continue with plan of care.    Problem: Adult Inpatient Plan of Care  Goal: Plan of Care Review  Outcome: Progressing  Goal: Patient-Specific Goal (Individualized)  Outcome: Progressing  Flowsheets (Taken 11/05/2022 1520)  Patient/Family-Specific Goals (Include Timeframe): Free from falls or injuries through discharge  Individualized Care Needs: Falls, labs, VS, pain, nausea  Anxieties, Fears or Concerns: pain, nausea  Goal: Absence of Hospital-Acquired Illness or Injury  Outcome: Progressing  Intervention: Prevent and Manage VTE (Venous Thromboembolism) Risk  Recent Flowsheet Documentation  Taken 11/05/2022 1400 by Doree Barthel, RN  Anti-Embolism Device Type: SCD, Knee  Anti-Embolism Intervention: Refused  Anti-Embolism Device Location: BLE  Taken 11/05/2022 1200 by Doree Barthel, RN  Anti-Embolism Device Type: SCD, Knee  Anti-Embolism Intervention: Refused  Anti-Embolism Device Location: BLE  Taken 11/05/2022 1000 by Doree Barthel, RN  Anti-Embolism Device Type: SCD, Knee  Anti-Embolism Intervention: Refused  Anti-Embolism Device Location: BLE  Taken 11/05/2022 0800 by Doree Barthel, RN  Anti-Embolism Device Type: SCD, Knee  Anti-Embolism Intervention: Refused  Anti-Embolism Device Location: BLE  Taken 11/05/2022 0702 by Doree Barthel, RN  Anti-Embolism Device Type: SCD, Knee  Anti-Embolism Intervention: Refused  Anti-Embolism Device Location: BLE  Goal: Optimal Comfort and Wellbeing  Outcome: Progressing  Goal: Readiness for Transition of Care  Outcome: Progressing  Goal: Rounds/Family Conference  Outcome: Progressing     Problem: Fall Injury Risk  Goal: Absence of Fall and Fall-Related Injury  Outcome: Progressing     Problem: Skin Injury Risk Increased  Goal: Skin Health and Integrity  Outcome: Progressing  Intervention: Optimize Skin Protection  Recent Flowsheet Documentation  Taken 11/05/2022 0702 by Doree Barthel, RN  Head of Bed [facility]) Positioning: HOB at 30 degrees     Problem: Nausea and Vomiting  Goal: Nausea and Vomiting Relief  Outcome: Progressing     Problem: Pain Acute  Goal: Optimal Pain Control and Function  Outcome: Progressing

## 2022-11-06 LAB — COMPREHENSIVE METABOLIC PANEL
ALBUMIN: 3.2 g/dL — ABNORMAL LOW (ref 3.4–5.0)
ALKALINE PHOSPHATASE: 164 U/L — ABNORMAL HIGH (ref 46–116)
ALT (SGPT): 89 U/L — ABNORMAL HIGH (ref 10–49)
ANION GAP: 4 mmol/L — ABNORMAL LOW (ref 5–14)
AST (SGOT): 132 U/L — ABNORMAL HIGH (ref <=34)
BILIRUBIN TOTAL: 0.2 mg/dL — ABNORMAL LOW (ref 0.3–1.2)
BLOOD UREA NITROGEN: 8 mg/dL — ABNORMAL LOW (ref 9–23)
BUN / CREAT RATIO: 19
CALCIUM: 9.6 mg/dL (ref 8.7–10.4)
CHLORIDE: 105 mmol/L (ref 98–107)
CO2: 32 mmol/L — ABNORMAL HIGH (ref 20.0–31.0)
CREATININE: 0.43 mg/dL — ABNORMAL LOW
EGFR CKD-EPI (2021) FEMALE: >90 mL/min/{1.73_m2} (ref >=60)
GLUCOSE RANDOM: 111 mg/dL (ref 70–179)
POTASSIUM: 3.9 mmol/L (ref 3.4–4.8)
PROTEIN TOTAL: 5.8 g/dL (ref 5.7–8.2)
SODIUM: 141 mmol/L (ref 135–145)

## 2022-11-06 MED ADMIN — multivitamin with folic acid 400 mcg tablet 1 tablet: 1 | ORAL | @ 14:00:00

## 2022-11-06 MED ADMIN — gabapentin (NEURONTIN) capsule 100 mg: 100 mg | ORAL | @ 01:00:00

## 2022-11-06 MED ADMIN — fluconazole (DIFLUCAN) 2 mg/mL in sodium chloride 0.9% 400 mg: 400 mg | INTRAVENOUS | @ 15:00:00 | Stop: 2022-11-09

## 2022-11-06 MED ADMIN — oxyCODONE (ROXICODONE) immediate release tablet 10 mg: 10 mg | ORAL | @ 05:00:00 | Stop: 2022-11-14

## 2022-11-06 MED ADMIN — mirtazapine (REMERON) tablet 15 mg: 15 mg | GASTROENTERAL | @ 01:00:00

## 2022-11-06 MED ADMIN — famotidine (PEPCID) tablet 20 mg: 20 mg | ORAL | @ 01:00:00

## 2022-11-06 MED ADMIN — cetirizine (ZYRTEC) tablet 10 mg: 10 mg | GASTROENTERAL | @ 14:00:00

## 2022-11-06 MED ADMIN — diclofenac sodium (VOLTAREN) 1 % gel 2 g: 2 g | TOPICAL | @ 01:00:00

## 2022-11-06 MED ADMIN — buprenorphine-naloxone (SUBOXONE) 2-0.5 mg SL film 0.5 mg of buprenorphine: .25 | SUBLINGUAL | @ 14:00:00

## 2022-11-06 MED ADMIN — diazePAM (VALIUM) tablet 7.5 mg: 7.5 mg | ORAL | @ 01:00:00

## 2022-11-06 MED ADMIN — ondansetron (ZOFRAN) injection 4 mg: 4 mg | INTRAVENOUS | @ 12:00:00

## 2022-11-06 MED ADMIN — ceFAZolin (ANCEF) IVPB 2 g in 50 ml dextrose (premix): 2 g | INTRAVENOUS | @ 14:00:00 | Stop: 2022-11-09

## 2022-11-06 MED ADMIN — HYDROmorphone (PF) (DILAUDID) injection 0.5 mg: .5 mg | INTRAVENOUS | @ 19:00:00 | Stop: 2022-11-08

## 2022-11-06 MED ADMIN — buprenorphine-naloxone (SUBOXONE) 2-0.5 mg SL film 0.5 mg of buprenorphine: .25 | SUBLINGUAL | @ 01:00:00

## 2022-11-06 MED ADMIN — HYDROmorphone (PF) (DILAUDID) injection 0.5 mg: .5 mg | INTRAVENOUS | @ 12:00:00 | Stop: 2022-11-08

## 2022-11-06 MED ADMIN — carboxymethylcellulose sodium (REFRESH CELLUVISC) 1 % ophthalmic gel 1 drop: 1 [drp] | OPHTHALMIC | @ 18:00:00

## 2022-11-06 MED ADMIN — baclofen (LIORESAL) tablet 20 mg: 20 mg | ORAL | @ 14:00:00

## 2022-11-06 MED ADMIN — HYDROmorphone (PF) (DILAUDID) injection 0.5 mg: .5 mg | INTRAVENOUS | @ 03:00:00 | Stop: 2022-11-08

## 2022-11-06 MED ADMIN — promethazine (PHENERGAN) 6.5 mg in sodium chloride (NS) 0.9 % 50 mL IVPB: 6.5 mg | INTRAVENOUS | @ 14:00:00

## 2022-11-06 MED ADMIN — naloxegol (MOVANTIK) 12.5 mg tablet 12.5 mg: 12.5 mg | ORAL | @ 14:00:00

## 2022-11-06 MED ADMIN — gabapentin (NEURONTIN) capsule 100 mg: 100 mg | ORAL | @ 19:00:00

## 2022-11-06 MED ADMIN — diazePAM (VALIUM) tablet 5 mg: 5 mg | ORAL | @ 19:00:00

## 2022-11-06 MED ADMIN — promethazine (PHENERGAN) 6.5 mg in sodium chloride (NS) 0.9 % 50 mL IVPB: 6.5 mg | INTRAVENOUS | @ 01:00:00

## 2022-11-06 MED ADMIN — buprenorphine-naloxone (SUBOXONE) 2-0.5 mg SL film 0.5 mg of buprenorphine: .25 | SUBLINGUAL | @ 19:00:00

## 2022-11-06 MED ADMIN — pantoprazole (Protonix) EC tablet 40 mg: 40 mg | ORAL | @ 01:00:00

## 2022-11-06 MED ADMIN — baclofen (LIORESAL) tablet 20 mg: 20 mg | ORAL | @ 01:00:00

## 2022-11-06 MED ADMIN — carboxymethylcellulose sodium (REFRESH CELLUVISC) 1 % ophthalmic gel 1 drop: 1 [drp] | OPHTHALMIC | @ 21:00:00

## 2022-11-06 MED ADMIN — gabapentin (NEURONTIN) capsule 100 mg: 100 mg | ORAL | @ 14:00:00

## 2022-11-06 MED ADMIN — senna (SENOKOT) tablet 1 tablet: 1 | ORAL | @ 05:00:00

## 2022-11-06 MED ADMIN — oxyCODONE (ROXICODONE) immediate release tablet 10 mg: 10 mg | ORAL | @ 18:00:00 | Stop: 2022-11-14

## 2022-11-06 MED ADMIN — HYDROmorphone (PF) (DILAUDID) injection 0.5 mg: .5 mg | INTRAVENOUS | @ 14:00:00 | Stop: 2022-11-08

## 2022-11-06 MED ADMIN — diclofenac sodium (VOLTAREN) 1 % gel 2 g: 2 g | TOPICAL | @ 21:00:00

## 2022-11-06 MED ADMIN — HYDROmorphone (PF) (DILAUDID) injection 0.5 mg: .5 mg | INTRAVENOUS | Stop: 2022-11-08

## 2022-11-06 MED ADMIN — HYDROmorphone (PF) (DILAUDID) injection 0.5 mg: .5 mg | INTRAVENOUS | @ 23:00:00 | Stop: 2022-11-08

## 2022-11-06 MED ADMIN — ceFAZolin (ANCEF) IVPB 2 g in 50 ml dextrose (premix): 2 g | INTRAVENOUS | @ 03:00:00 | Stop: 2022-11-09

## 2022-11-06 MED ADMIN — diazePAM (VALIUM) tablet 2.5 mg: 2.5 mg | ORAL | @ 14:00:00

## 2022-11-06 MED ADMIN — pantoprazole (Protonix) EC tablet 40 mg: 40 mg | ORAL | @ 14:00:00

## 2022-11-06 MED ADMIN — ceFAZolin (ANCEF) IVPB 2 g in 50 ml dextrose (premix): 2 g | INTRAVENOUS | @ 21:00:00 | Stop: 2022-11-09

## 2022-11-06 MED ADMIN — carboxymethylcellulose sodium (REFRESH CELLUVISC) 1 % ophthalmic gel 1 drop: 1 [drp] | OPHTHALMIC | @ 01:00:00

## 2022-11-06 MED ADMIN — promethazine (PHENERGAN) 6.5 mg in sodium chloride (NS) 0.9 % 50 mL IVPB: 6.5 mg | INTRAVENOUS | @ 21:00:00

## 2022-11-06 MED ADMIN — baclofen (LIORESAL) tablet 20 mg: 20 mg | ORAL | @ 19:00:00

## 2022-11-06 NOTE — Unmapped (Signed)
Alert & oriented x4, 2L/[place], NG left nare w/TF infusing, up w/assist to bathroom x1, ongoing abd pain, nausea, resolved by meds see MAR for details. Continues c/o pain lower back; repositioned for comfort. Up in chair upon arrival during shift report and tolerating well. Care, comfort, and safety maintained. Bed in lowest position and locked w/side rail up x2. Bedside table and call light within reach.         Problem: Adult Inpatient Plan of Care  Goal: Plan of Care Review  Outcome: Ongoing - Unchanged  Flowsheets (Taken 11/06/2022 0656)  Progress: improving  Plan of Care Reviewed With:   patient   family  Goal: Patient-Specific Goal (Individualized)  Outcome: Ongoing - Unchanged  Goal: Absence of Hospital-Acquired Illness or Injury  Outcome: Ongoing - Unchanged  Goal: Optimal Comfort and Wellbeing  Outcome: Ongoing - Unchanged  Goal: Readiness for Transition of Care  Outcome: Ongoing - Unchanged  Goal: Rounds/Family Conference  Outcome: Ongoing - Unchanged     Problem: Latex Allergy  Goal: Absence of Allergy Symptoms  Outcome: Ongoing - Unchanged     Problem: Fall Injury Risk  Goal: Absence of Fall and Fall-Related Injury  Outcome: Ongoing - Unchanged     Problem: Skin Injury Risk Increased  Goal: Skin Health and Integrity  Outcome: Ongoing - Unchanged     Problem: Comorbidity Management  Goal: Blood Glucose Levels Within Targeted Range  Outcome: Ongoing - Unchanged  Goal: Blood Pressure in Desired Range  Outcome: Ongoing - Unchanged     Problem: Nausea and Vomiting  Goal: Nausea and Vomiting Relief  Outcome: Ongoing - Unchanged     Problem: Infection  Goal: Absence of Infection Signs and Symptoms  Outcome: Ongoing - Unchanged     Problem: Pain Acute  Goal: Optimal Pain Control and Function  Outcome: Ongoing - Unchanged     Problem: Malnutrition  Goal: Improved Nutritional Intake  Outcome: Ongoing - Unchanged

## 2022-11-06 NOTE — Unmapped (Signed)
Hospitalist Daily Progress Note     LOS: 40 days under inpatient status    Assessment/Plan:  Principal Problem:    Other acute postprocedural pain  Active Problems:    [name]    Intestinal polyps    [name]    Neoplasm related pain    Physical deconditioning    History of colectomy    Intractable abdominal pain    Generalized anxiety disorder with panic attacks    SBO (small bowel obstruction) (CMS-HCC)    Small intestinal bacterial overgrowth (SIBO)         *Enterobacteriaceae group bacteremia, C albicans fungemia.  Resolving.  Likely due to RIJ tunneled line placed 01/2022 now removed.  8/13, 8/17 repeat blood cultures NGTD.  No evidence of vitreitis, retinitis 8/15 per Ophtho.  TTE 8/15 w possible vegetations on MV and TV or indwelling catheter; however, TEE 8/20 w/o vegetations.  (Cefepime 8/13-8/20, metronidazole 8/13-8/16, micafungin 8/14-8/22)  -Continue cefazolin (started 8/20), fluconazole (IV given difficulty tolerating PO form) through 8/31, even w slightly elevated LFTs.  -Check CBC w diff weekly, CMP daily while on above.     *[name], desmoid fibromatosis.  H/O proctocolectomy, IPAA.  Follows w Dr. Theone Stanley [facility]).  S/P planned triamcinolone/bupivacaine injection of 2 RUE desmoid tumors, L chest tumor by IR 7/18, complicated by post-op pain which was initial reason for this stay.  Have intermittently held home nirogacestat for various reasons this stay.  -Can restart home nirogacestat at any time after personal communication w Dr. [name] 8/26 at 50mg  BID as before.  Can then titrate up to 100mg  BID after 1 week and then to goal 150mg  BID after 1 week per his rec (even though starting at goal dose of 150mg  BID is standard).  Holding off on restarting given elevated LFTs.     *Nausea.  Improving but not resolved.  Ileus resolved, needed NG decompression earlier this stay w TPN 7/21-8/13.  Baseline necessary opioid use.  H/O TPN dependence.  -Continue ND tube feeds (Osmolite 1.5 1ml/h, free water flush 50ml q3h) though probably needs closer to H2O q3h instead of 50ml q3h if not taking fluids PO.  -Continue bisacodyl, PEG, senna PRN for now as is having stools spontaneously.  -Continue naloxegol.  -Allowing IV promethazine PRN.     *Generalized anxiety disorder, depression, h/o panic attacks.  -Continue home mirtazapine.  -Since using diazepam (see below), will use this to treat anxiety as well after discussion w Psych 8/27.  Continue TID but at 2.5/5/7.5 after discussing w her and Psych.  -If needed could give 2.5mg  PO diazepam for acute anxiety.  -Appreciate Psych???s assistance.     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  Follows w [facility].  Typically on oxycodone 10mg  PRN at home.  S/P lidocaine 7/21-7/24, ketamine 8/1-8/9.  -Continue home buprenorphine-naloxone 0.5-0.125 TID.  -Continue oxycodone 10-15mg  q4h PRN for now.  -Allowing hydromorphone 0.5mg  q3h PRN IV for now given nausea issues and difficulty taking PO meds at times.  -Continue gabapentin.     *Muscle spasm, stiffness, deconditioning.  MRI C, T, L-spine and MRI brain all w and w/o contrast unremarkable.  7/20 paraneoplastic Ab panel w slightly elevated P/Q-type Ca channel Ab which can be seen w [facility].  However, EMG NL.  Autoimmune myelopathy panel neg, including neg for anti-GAD65 Ab's making stiff person syndrom unlikely.  -Continue baclofen for now but might consider starting to wean this.  -Continue diazepam (though was started for possible  stiff person syndrome) as this seems to be helping w muscle stiffness as well as anxiety.  -Gabapentin as above.  -Continue PT/OT.     *Transaminase elevation.  Unclear etiology, possibly related to cefazolin or fluconazole.  Previous transaminase elevation thought to be 2/2 TPN or nirogacestat as LFT rise preceded starting fluconazole.  Liver U/S w Doppler 8/15 unremarkable.  -Continue to monitor daily.     *Oral candidiasis.  Resolved.     *Fe def anemia. S/P 1.02g IV Fe (ferumoxytol).  H/O Fe hypersensitivity reactions and evaluated by [facility].     *SIBO.  Typically on cefdinir MWF, doxy TTSS at home.     *Coffee-ground emesis earlier this stay likely 2/2 NGT.  -Continue PPI BID for now.     *H/O DVT.     *DVT prophylaxis.  Avoiding anticoag due to GI bleeding history though has tolerated apixaban 2.5mg  BID in past for DVT treatment.  -SCD's, ambulation when able.     *Dispo.  Pending above.  AIR referrals sent; she is very interested in AIR due to motivation to get back to baseline as quickly as able.  RIJ central line in place (placed 8/14).  Has gone home with Corpak before (12/2021); would probably be able to go to AIR w this in my discussion w them as long as there is a plan regarding what to do with Corpak on DC (e.g., leaving it in on DC vs removing it).  She would be able to transfer to AIR with a central line in place per AIR.        Items needed per AIR:  -Corpak/feeding plan.  May need to ask GI for assistance w following this before transfer to AIR as has been followed by Dr. [name] as outpt when previously on tube feeds via Corpak at home and also when previously on TPN at home.  Would not be surprised if needs this even upon DC from AIR.  -Nirogacestat restarted prior to transfer to AIR.  Plan as above would be to start at 50mg  BID and titrate upwards to 100mg  BID after 1 week and then 150mg  BID after 1 week.          Please page the [facility] at [phone] with questions.              Consultants:   1. Neuro (off)  2. Chronic Pain (off)  3. GI (off)  4. Psych  5. PM&R  6. Lower GI Surg (off)  7. ID (off)  8. Ophtho (off)          Subjective:   Hydromorphone x 5, ondansetron x 1, oxycodone x 1, promethazine x 2, senna x 1 in last 24 hrs.  Just worked w PT/OT out in hall.  Seems to be having a good day.  Mother showed me a video of her PT session where she took a few small steps, lifting feet an inch or two off the floor.      Objective: Physical Exam:  General: Sitting comfortably in chair, no acute distress.   Eyes: No conjunctival injection or icterus.     ENT: Mucous membranes moist.  Normal appearing ears, nose.   Neck: Midline trachea.     Respiratory: Normal respiratory effort.     Cardiovascular: No lower extremity edema.   Gastrointestinal: Bowel sounds present, soft, nondistended.   Skin: No obvious rashes or breakdown.  Warm, dry.   Musculoskeletal: Motor strength in all extremities grossly intact.  Psychiatric: Alert.  Answers questions appropriately.  Judgment and insight intact.   Neurologic: Extraocular movements intact, no facial asymmetry.  No gross sensory deficits.       Vital signs in last 24 hours:  Temp:  [36.3 ??C (97.3 ??F)-36.9 ??C (98.4 ??F)] 36.4 ??C (97.5 ??F)  Heart Rate:  [70-89] 87  Resp:  [17-18] 17  BP: (102-114)/(61-71) 114/64  MAP (mmHg):  [78-85] 79  SpO2:  [100 %] 100 %    Intake/Output last 24 hours:    Intake/Output Summary (Last 24 hours) at 11/06/2022 0408  Last data filed at 11/05/2022 2127  Gross per 24 hour   Intake --   Output 4120 ml   Net -4120 ml       Most recent recorded weights:  Last 5 Recorded Weights    09/29/22 1300 10/06/22 1021 10/09/22 1730 10/11/22 0530   Weight: 62.1 kg (137 lb) 60.8 kg (134 lb 1.6 oz) 61 kg (134 lb 6.4 oz) 60.2 kg (132 lb 11.5 oz)    10/26/22 1730   Weight: 54 kg (119 lb)       Medications:   Scheduled Meds:   baclofen  20 mg Oral TID    buprenorphine-naloxone  0.25 Film Sublingual TID    carboxymethylcellulose sodium  1 drop Both Eyes QID    ceFAZolin  2 g Intravenous Q8H    cetirizine  10 mg Enteral tube: gastric Daily    diazePAM  2.5 mg Oral Daily    And    diazePAM  5 mg Oral Daily    And    diazePAM  7.5 mg Oral Nightly    diclofenac sodium  2 g Topical QID    famotidine  20 mg Oral Nightly    fluconazole (DIFLUCAN) INTRAVENOUS  400 mg Intravenous Q24H    gabapentin  100 mg Oral TID    mirtazapine  15 mg Enteral tube: gastric Nightly    multivitamins (ADULT)  1 tablet Oral Daily    naloxegol  12.5 mg Oral Daily    [Provider Hold] nirogacestat  50 mg Oral BID    pantoprazole  40 mg Oral BID     Continuous Infusions:   Chemo Clarification Order      sodium chloride           ADMINISTRATIVE  50  minutes total spent on this encounter, including preparing to see the patient, seeing and examining the patient, counseling the patient, counseling family, placing orders, discussing the patient with other providers, documenting the encounter, and coordinating care.

## 2022-11-06 NOTE — Unmapped (Signed)
[facility]  Follow-Up Psychiatry Consult Note      Date of admission: 09/25/2022 10:30 AM  Service Date: November 06, 2022  Primary Team: [facility])  LOS:  LOS: 40 days      Assessment:   [name] [name] is a 22 y.o. 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 and reported past psych history of GAD and MDD, admitted 09/25/2022 10:30 AM for other acute postprocedural pain following injection of desmoid tumor with steroid and anesthetic under ultrasound guidance.  Patient was seen in consultation by request of hospitalist on [facility] for evaluation of anxiety and depression.      [name] [name] presents with symptoms consistent with a diagnosis of GAD and MDD, consistent with her historical diagnoses. Symptoms have worsened in the context of prolonged medical hospitalization, with increased anxiety/panic attacks.     Today, patient reports doing better as a result of visitors and seeing progress in her care. She also cites the valium schedule change making a big difference in her sleep which has been welcome. Given her recent improvements with anxiety and muscle stiffness, we recommend no changes today. We will continue to follow.    Diagnoses:   Active Hospital problems:  Principal Problem:    Other acute postprocedural pain  Active Problems:    [name]    Intestinal polyps    [name]    Neoplasm related pain    Physical deconditioning    History of colectomy    Intractable abdominal pain    Generalized anxiety disorder with panic attacks    SBO (small bowel obstruction) (CMS-HCC)    Small intestinal bacterial overgrowth (SIBO)       Problems edited/added by me:  No problems updated.    Risk Assessment:  ASQ screening result: not completed    -A full risk assessment was previously performed on 7/31.  Risk assessment remains essentially unchanged.    Current suicide risk: low risk  Current homicide risk: low risk      Recommendations:     Safety and Observation Level:   -- This patient is not currently under IVC. If safety concerns arise, please page psychiatry for an evaluation.    Medications:  -- Continue valium 2.5 mg q0900 + 5 mg q1400 + 7.5 mg q2100 to target anxiety at night. (Overall same 15 mg dose)  -- continue mirtazapine 15mg  q2200 for mood and anxiety    Further Work-up:   -- none at this time.    Behavioral / Environmental:   -- Utilize compassion and acknowledge the patient's experiences while setting clear and realistic expectations for care.     Follow-up:  -- When patient is discharged, please ensure that their AVS includes information about the 42 Suicide & Crisis Lifeline.  -- The patient currently receives mental health care with [facility] and we will attempt to coordinate followup with our psychiatry team on discharge. She is also recently established with a virtual DBT group at [facility] ([employer] LCSW) although it is unclear what the status of this group is   -- We will follow as needed at this time.     Thank you for this consult request. Recommendations have been communicated to the primary team. Please page [phone]  for any questions or concerns.     Scribe's Attestation: [name] Maxwell, MD obtained and performed the history, physical exam and medical decision making elements that were  entered into the chart. Signed by [name] [name], Scribe, November 06, 2022 8:19 AM    ----------------------------------------------------------------------------------------------------------------------  November 07, 2022 8:00 AM. Documentation assistance provided by the Scribe. I was present during the time the encounter was recorded. The information recorded by the Scribe was done at my direction and has been reviewed and validated by me.    [name] Maxwell, MD  ----------------------------------------------------------------------------------------------------------------------            Subjective   Relevant events since last seen by psychiatry on 8/27:   - No acute events or behavior noted.  - Pt endorsed having a bad day on 8/27.  - 8/28 Labs: K 3.9    Med Updates:  - Diazepam 2.5 mg q0900 + 5 mg q1400 + 7.5 mg 2100 start4ed on 8/27 (previously 5 mg TID)  - Lorazepam 1 mg BID IV PRN D/C'd on 8/27    Patient Interview:  Pt lying in bed awake, mother present. Discusses her recent visits from friends and family which have increased over the past few weeks and made her very happy. Mom helped rearrange the room and spent more time in the chair. Admits having a hard time sharing information about her medical situation since childhood. Continues to endorse worries about her relationships crashing and burning due to her medical illness. Talks about past SO's and childhood friends, as she becomes tearful, stating she has a mental barrier to reaching out or being more open.     Spirits have been up and down. Once visitors leave, she feels like reality hits. Discusses being happy that she was able to walk to the door and back, as she was able to see progress, though endorses constant worsening back pain, she could almost not get back in bed due to pain. Changing diazepam's schedule allowed her to almost sleep from 2 - 7 AM, and cites medication change made the biggest difference. She feels like her legs moved better yesterday. Pt asks about lack of sleep causing her to lose her train of though, occurring a couple times recently. MD states that it is cause for concern only if it continuously occurs or if sleep does not help her problem. Wants to start blogging to be able to give updates to her friends.     ROS:   All systems reviewed as negative/unremarkable aside from the following pertinent positives and negatives: ongoing anxiety    Collateral:   - Reviewed medical records in Epic  - Spoke with RN: Pt was good last night, and was able to sleep.   - Mom confirmed she was doing well.    Relevant Updates to past psychiatric, medical/surgical, family, or social history: n/a    Current Medications:  Scheduled Meds:   baclofen  20 mg Oral TID    buprenorphine-naloxone  0.25 Film Sublingual TID    carboxymethylcellulose sodium  1 drop Both Eyes QID    ceFAZolin  2 g Intravenous Q8H    cetirizine  10 mg Enteral tube: gastric Daily    diazePAM  2.5 mg Oral Daily    And    diazePAM  5 mg Oral Daily    And    diazePAM  7.5 mg Oral Nightly    diclofenac sodium  2 g Topical QID    famotidine  20 mg Oral Nightly    fluconazole (DIFLUCAN) INTRAVENOUS  400 mg Intravenous Q24H    gabapentin  100 mg Oral TID    mirtazapine  15 mg Enteral tube: gastric  Nightly    multivitamins (ADULT)  1 tablet Oral Daily    naloxegol  12.5 mg Oral Daily    [Provider Hold] nirogacestat  50 mg Oral BID    pantoprazole  40 mg Oral BID     Continuous Infusions:   Chemo Clarification Order      sodium chloride       PRN Meds:.acetaminophen, alum-mag-simeth, bisacodyl, carboxymethylcellulose sodium, Chemo Clarification Order, guaiFENesin, hydrocortisone, HYDROmorphone, lidocaine, lidocaine-diphenhydrAMINE-aluminum-magnesium, ondansetron, oxyCODONE **OR** oxyCODONE, phenol, polyethylene glycol, promethazine, pseudoephedrine, senna, zinc oxide-cod liver oil      Objective:   Vital signs:   Temp:  [36.3 ??C (97.3 ??F)-36.9 ??C (98.4 ??F)] 36.5 ??C (97.7 ??F)  Heart Rate:  [74-89] 79  Resp:  [16-18] 16  BP: (102-114)/(60-71) 103/60  MAP (mmHg):  [73-85] 73  SpO2:  [100 %] 100 %    Physical Exam:  Gen: No acute distress.    Mental Status Exam:  Appearance:  appears stated age   Attitude:   calm, cooperative   Behavior/Psychomotor:  appropriate eye contact and no abnormal movements   Speech/Language:   normal rate, not pressured, normal volume, normal fluency. normal articulation   Mood:  ???Up and down???   Affect:  mood congruent and euthymic. Appropriately sad at times.   Thought process:  logical, linear, clear, coherent, goal directed   Thought content:    denies thoughts of self-harm. Denies SI, plans, or intent. Denies HI.  No grandiose, self-referential, persecutory, or paranoid delusions noted.   Perceptual disturbances:   denies auditory and visual hallucinations   Attention:  able to attend to interview without fluctuations in consciousness   Concentration:  Able to fully concentrate and attend   Orientation:  grossly oriented.   Memory:  not formally tested, but grossly intact   Fund of knowledge:   not formally assessed   Insight:    Intact   Judgment:   Intact   Impulse Control:  Intact     Relevant laboratory/imaging data was reviewed.    Additional Psychometric Testing:  Not applicable.    Consult Type and Time-Based Documentation:  This patient was evaluated in person.    Time-based billing disclaimer:     I personally spent 60 minutes face-to-face and non-face-to-face in the care of this patient, which includes all pre, intra, and post visit time on the date of service.  All documented time was specific to the E/M visit and does not include any procedures that may have been performed.

## 2022-11-07 LAB — COMPREHENSIVE METABOLIC PANEL
ALBUMIN: 2.9 g/dL — ABNORMAL LOW (ref 3.4–5.0)
ALKALINE PHOSPHATASE: 193 U/L — ABNORMAL HIGH (ref 46–116)
ALT (SGPT): 121 U/L — ABNORMAL HIGH (ref 10–49)
ANION GAP: 4 mmol/L — ABNORMAL LOW (ref 5–14)
AST (SGOT): 138 U/L — ABNORMAL HIGH (ref <=34)
BILIRUBIN TOTAL: 0.2 mg/dL — ABNORMAL LOW (ref 0.3–1.2)
BLOOD UREA NITROGEN: 7 mg/dL — ABNORMAL LOW (ref 9–23)
BUN / CREAT RATIO: 16
CALCIUM: 9.5 mg/dL (ref 8.7–10.4)
CHLORIDE: 104 mmol/L (ref 98–107)
CO2: 32 mmol/L — ABNORMAL HIGH (ref 20.0–31.0)
CREATININE: 0.43 mg/dL — ABNORMAL LOW
EGFR CKD-EPI (2021) FEMALE: >90 mL/min/{1.73_m2} (ref >=60)
GLUCOSE RANDOM: 119 mg/dL (ref 70–179)
POTASSIUM: 3.7 mmol/L (ref 3.4–4.8)
PROTEIN TOTAL: 5.9 g/dL (ref 5.7–8.2)
SODIUM: 140 mmol/L (ref 135–145)

## 2022-11-07 MED ADMIN — carboxymethylcellulose sodium (REFRESH CELLUVISC) 1 % ophthalmic gel 1 drop: 1 [drp] | OPHTHALMIC | @ 10:00:00

## 2022-11-07 MED ADMIN — HYDROmorphone (PF) (DILAUDID) injection 0.5 mg: .5 mg | INTRAVENOUS | @ 20:00:00 | Stop: 2022-11-07

## 2022-11-07 MED ADMIN — gabapentin (NEURONTIN) capsule 100 mg: 100 mg | ORAL | @ 19:00:00

## 2022-11-07 MED ADMIN — ondansetron (ZOFRAN) injection 4 mg: 4 mg | INTRAVENOUS | @ 02:00:00

## 2022-11-07 MED ADMIN — buprenorphine-naloxone (SUBOXONE) 2-0.5 mg SL film 0.5 mg of buprenorphine: .25 | SUBLINGUAL | @ 14:00:00

## 2022-11-07 MED ADMIN — diazePAM (VALIUM) tablet 5 mg: 5 mg | ORAL | @ 04:00:00

## 2022-11-07 MED ADMIN — buprenorphine-naloxone (SUBOXONE) 2-0.5 mg SL film 0.5 mg of buprenorphine: .25 | SUBLINGUAL | @ 19:00:00

## 2022-11-07 MED ADMIN — diazePAM (VALIUM) tablet 5 mg: 5 mg | ORAL | @ 19:00:00 | Stop: 2022-11-07

## 2022-11-07 MED ADMIN — carboxymethylcellulose sodium (REFRESH CELLUVISC) 1 % ophthalmic gel 1 drop: 1 [drp] | OPHTHALMIC | @ 22:00:00

## 2022-11-07 MED ADMIN — ceFAZolin (ANCEF) IVPB 2 g in 50 ml dextrose (premix): 2 g | INTRAVENOUS | @ 14:00:00 | Stop: 2022-11-09

## 2022-11-07 MED ADMIN — HYDROmorphone (PF) (DILAUDID) injection 0.5 mg: .5 mg | INTRAVENOUS | @ 15:00:00 | Stop: 2022-11-08

## 2022-11-07 MED ADMIN — alteplase (ACTIVase) injection small catheter clearance 1 mg: 1 mg | INTRAVENOUS | @ 02:00:00 | Stop: 2022-11-06

## 2022-11-07 MED ADMIN — famotidine (PEPCID) tablet 20 mg: 20 mg | ORAL | @ 02:00:00

## 2022-11-07 MED ADMIN — diclofenac sodium (VOLTAREN) 1 % gel 2 g: 2 g | TOPICAL | @ 04:00:00

## 2022-11-07 MED ADMIN — ondansetron (ZOFRAN) injection 4 mg: 4 mg | INTRAVENOUS | @ 15:00:00

## 2022-11-07 MED ADMIN — cetirizine (ZYRTEC) tablet 10 mg: 10 mg | GASTROENTERAL | @ 14:00:00

## 2022-11-07 MED ADMIN — diclofenac sodium (VOLTAREN) 1 % gel 2 g: 2 g | TOPICAL | @ 10:00:00

## 2022-11-07 MED ADMIN — fluconazole (DIFLUCAN) 2 mg/mL in sodium chloride 0.9% 400 mg: 400 mg | INTRAVENOUS | @ 15:00:00 | Stop: 2022-11-09

## 2022-11-07 MED ADMIN — gabapentin (NEURONTIN) capsule 100 mg: 100 mg | ORAL | @ 02:00:00

## 2022-11-07 MED ADMIN — ceFAZolin (ANCEF) IVPB 2 g in 50 ml dextrose (premix): 2 g | INTRAVENOUS | @ 04:00:00 | Stop: 2022-11-09

## 2022-11-07 MED ADMIN — naloxegol (MOVANTIK) 12.5 mg tablet 12.5 mg: 12.5 mg | ORAL | @ 15:00:00

## 2022-11-07 MED ADMIN — diclofenac sodium (VOLTAREN) 1 % gel 2 g: 2 g | TOPICAL | @ 17:00:00

## 2022-11-07 MED ADMIN — HYDROmorphone (PF) (DILAUDID) injection 0.5 mg: .5 mg | INTRAVENOUS | @ 19:00:00 | Stop: 2022-11-08

## 2022-11-07 MED ADMIN — gabapentin (NEURONTIN) capsule 100 mg: 100 mg | ORAL | @ 14:00:00

## 2022-11-07 MED ADMIN — carboxymethylcellulose sodium (REFRESH CELLUVISC) 1 % ophthalmic gel 1 drop: 1 [drp] | OPHTHALMIC | @ 17:00:00

## 2022-11-07 MED ADMIN — buprenorphine-naloxone (SUBOXONE) 2-0.5 mg SL film 0.5 mg of buprenorphine: .25 | SUBLINGUAL | @ 02:00:00

## 2022-11-07 MED ADMIN — baclofen (LIORESAL) tablet 20 mg: 20 mg | ORAL | @ 14:00:00

## 2022-11-07 MED ADMIN — promethazine (PHENERGAN) 6.5 mg in sodium chloride (NS) 0.9 % 50 mL IVPB: 6.5 mg | INTRAVENOUS | @ 19:00:00

## 2022-11-07 MED ADMIN — promethazine (PHENERGAN) 6.5 mg in sodium chloride (NS) 0.9 % 50 mL IVPB: 6.5 mg | INTRAVENOUS | @ 10:00:00

## 2022-11-07 MED ADMIN — pantoprazole (Protonix) EC tablet 40 mg: 40 mg | ORAL | @ 14:00:00

## 2022-11-07 MED ADMIN — HYDROmorphone (PF) (DILAUDID) injection 0.5 mg: .5 mg | INTRAVENOUS | @ 10:00:00 | Stop: 2022-11-08

## 2022-11-07 MED ADMIN — carboxymethylcellulose sodium (REFRESH CELLUVISC) 1 % ophthalmic gel 1 drop: 1 [drp] | OPHTHALMIC | @ 02:00:00

## 2022-11-07 MED ADMIN — diazePAM (VALIUM) tablet 2.5 mg: 2.5 mg | ORAL | @ 14:00:00 | Stop: 2022-11-07

## 2022-11-07 MED ADMIN — pantoprazole (Protonix) EC tablet 40 mg: 40 mg | ORAL | @ 02:00:00

## 2022-11-07 MED ADMIN — diclofenac sodium (VOLTAREN) 1 % gel 2 g: 2 g | TOPICAL | @ 22:00:00

## 2022-11-07 MED ADMIN — ceFAZolin (ANCEF) IVPB 2 g in 50 ml dextrose (premix): 2 g | INTRAVENOUS | @ 19:00:00 | Stop: 2022-11-09

## 2022-11-07 MED ADMIN — baclofen (LIORESAL) tablet 20 mg: 20 mg | ORAL | @ 19:00:00

## 2022-11-07 MED ADMIN — baclofen (LIORESAL) tablet 20 mg: 20 mg | ORAL | @ 02:00:00

## 2022-11-07 MED ADMIN — HYDROmorphone (PF) (DILAUDID) injection 0.5 mg: .5 mg | INTRAVENOUS | @ 02:00:00 | Stop: 2022-11-08

## 2022-11-07 MED ADMIN — multivitamin with folic acid 400 mcg tablet 1 tablet: 1 | ORAL | @ 14:00:00

## 2022-11-07 NOTE — Unmapped (Signed)
Physical Medicine and Rehab  Inpatient Consult Note    Requesting Attending Physician: Kateri Plummer, MD  Service Requesting Consult: Med Bernita Raisin [facility])    Thank you for this consult.  Please contact the PM&R consult pager [phone] for questions regarding these recommendations.  For questions of bed availability at [facility], contact the Intake Office at [phone]. Please contact your case manager for updates on AIR process as they are in regular contact with the rehab intake office and should have the latest information.    *This PM&R consult does not guarantee that patient has been accepted to [facility] AIR, 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 Patient is currently hospitalized since 09/25/2022. The patient is seen in consultation for evaluation of rehabilitation needs.    Checklist for Potential AIR:   [ ]  EDD/what is estimated discharge date?  [ ]  Referral from CM if wants [place] AIR  [ ]  PT/OT within 48 hours of AIR  [ ]  dispo verified and [place] AIR preference  [ ]  CBC/BMP & other pertinent labs within 48 hours of AIR  [ ]  Prefer oral pain meds only, but if on IV pain meds, then no more than q 6-8 hours prn  [ ]  6 minute walk test completed for home oxygen requirement  [ ]  If on oxygen, no more than 4 LPM [place]  [ ]  Chemotherapy plan documented  [ ]  Currently needing TF via NGT.  May need to discharge to home with NGT. Feeding plan ongoing   [ ]  Documentation of therapy tolerance    Dispo: home with family. She previously was independent with basic ADLs. Has MSK limitations which required some assistance. She is in RN school    Functional Impairments and Recommendations  - Please continue to have patient work with PT, OT, and SLP to maximize functional status with mobility, ADLs, and cogniton/swallow function.  - Will start insurance authorization if demonstrated improvement with physical therapy and documented chemotherapy plan  - Fall risk precautions  - Recommend OOB to chair daily with assistance if appropriate    Principal Medical Problem:  Other acute postprocedural pain    Increased Tonicity  Dystonia   Started post-procedurally per patient ~09/25/22. Occurs intermittently throughout the day with a heightened occurrence during mobilization. At rest patient bilateral ankles in plantarflexion with worsening heel cord tightening. Has tonicity most prominent to HF and KE that is not velocity dependent. Has not noted significant improvement with increase of baclofen to 15 mg TID in setting of infection as below. Differential wise, Neurology previously evaluated patient seemingly ruling out an acute dystonic reaction, cord compression, central process, nutritional deficiency, stiff person syndrome and [name] [name] after normal EMG on 8/22.   - Continue Baclofen 20 mg TID - if worsening respiratory status, daytime somnolence, can decrease baclofen given improvement with valium   - On Valium 2.5/5/7.5 mg TID however was drowsy. After discussion with patient recommend decreasing to Valium 2.5/2.5/5 mg TID with 2.5 mg Nightly PRN dose available.   - At risk for contractures, Recommend ordering bilateral hard PRAFO boots  - Recommend OOB to chair daily with  assistance if appropriate     Headache  Currently managed with tylenol. She does have some posterior neck muscle tightness. Notes ketamine is helpful for headache pains. Describes photophobia and phonophobia with events. I discussed with patient our hope is to decrease risk of rebound headaches and limit medical issues which will interfere with therapy. An additional thought is patient's headaches could be cervicogenic. She does have loss of cervical lordosis on most recent MRI.   - Given patient with elevated heart rate, frequent headaches, and has limited therapy participation 2/2 tachycardia in setting of continuous headaches  -If BP improves, would recommend propranolol 5 mg BID for headache prophylaxis.   - Prn tylenol +/- prn fiorcet   - Lidocaine cream PRN to area of pain for cervicogenic headaches    Pain  Has a chronic pain provider.   - Tylenol 650 mg q6h PRN  - IV Dilaudid 0.5 mg q3h PRN  - Oxyocodone 10-15 mg q4h PRN  - Suboxone 0.5 mg TID  - Continue Gabapentin 100 mg TID, can increase to 300mg  TID as tolerated  - Voltaren gel QID    Multifactorial Ileus   [place] Emesis 2/2 NGT placement (resolved)  Contributors with history of narcotics, poor PO intake. Concern for malnutrition. Improvements to abdominal pain since weaning opioids. TPN has been discontinued with patient now tolerating tube feeds  -Continue Tube feeds as guided by nutrition. May discharge home with corpak  -bowel regimen: miralax 17g BID, senna 2 tablets nightly, suppository BID  -NG tube in place  -Trialing food/meds PO prior to NG tube removal   -PO Protonix 40 mg BID     Syncope  Multifactorial. Primary team ddx includes: vasovagal vs adverse effect of medications vs deconditioning vs iron deficiency. Last echo 04/2022  -work-up ongoing with plans for cosyntropin stim test and +/- repeat echo    IDA  -has allergy to formula iron     [name] - Desmoid Tumors  Follows [facility] (Dr. [name]) is patient's outpatient oncologist.   - Per Primary: Can restart home nirogacestat at any time after personal communication w Dr. [name] 8/26 at 50mg  BID as before. Can then titrate up to 100mg  BID after 1 week and then to goal 150mg  BID after 1 week per his rec (even though starting at goal dose of 150mg  BID is standard). Holding off on restarting given elevated LFTs.     Enterobacteriaceae group bacteremia - C albicans fungemia    Likely due to RIJ tunneled line placed 01/2022 now removed on 8/14.  8/13, 8/17 repeat blood cultures NGTD.  No evidence of vitreitis, retinitis 8/15 per Ophtho.  TTE 8/15 w possible vegetations on MV and TV or indwelling catheter; however, TEE 8/20 w/o vegetations. ID consulted with recommendations as below. S/p cefepime (8/13-8/20), metronidazole (8/13-8/16), and micafungin (8/14-8/22)   -Continue cefazolin (started 8/20), fluconazole (IV given difficulty tolerating PO form) through 8/31, even w slightly elevated LFTs.  -Check CBC w diff weekly, CMP daily while on above.  - Tylenol prn fever, bolus fluids prn    Generalized anxiety disorder, depression, h/o panic attacks  Psych consulted with recommendations as below.   - Continue home mirtazapine  - Valium as above, If needed could give 2.5mg  PRN for acute anxiety.    Other Medical Considerations:  While in AIR, the patient has medical issues requiring active management/supervision by a rehabilitation physician, with face-to-face visits at least 3 days per week to assess the patient both medically and functionally  and to modify the course of treatment as needed; The Physiatrist leads an intensive and coordinated interdisciplinary team approach to the delivery of inpatient rehabilitative care that cannot be provided in a less intensive setting such as the home, outpatient center or SNF. If not done in hospital, patient is at high risk for falls, respiratory failure, bleeding, infections, or delirium.  Risk for VTE    Patient's Functional Goals  Increase mobility, return to RN school    Functional Status Updates:  Prior Functional Status:    Prior Functional Status: Pt reports following her last hospitalization she was able to go to the beach briefly and ambulate on the sand. Was also able to play mini-golf but unable to bend down to grab a ball. Since her egg retrieval and the initiation of her new chemo medication, she has been spending most of her time in bed. No falls in the past month, but did have a few falls last hospitalization due to syncope.    Current Functional Status: Therapy notes reviewed and analyzed   Activities of Daily Living:   Assessment  Problem List: Decreased strength, Impaired ADLs, Decreased activity tolerance, Decreased mobility, Fall risk, Gait deviation, Impaired balance, Pain, Decreased endurance, Decreased coordination  Assessment: Pt seen for progression of short term goals. Pt able to complete hair washing with max A, working towards OT goals. Pt limited this session 2/2 fatiuge with OT following PT session and administration of pain medication, nausea medication, and new medication. Pt and family highly motivated to participate in therapy and work towards goals. Pt continues to present with above deficits impacting safe and independent ADL participation. Pt continues to present below baseline and would continue to benefit from skilled OT services to maximize functional independence and safety.  Today's Interventions: ADL retraining, Balance activities, Bed mobility, Compensatory tech. training, Conservation, Education - Patient, Education - Family / caregiver, Endurance activities, Safety education  Chlorhexidine Treatment: Given  Today's Interventions: Pt educated on role of OT, OT POC, importance of OOB activity and ADL participation with Engineer, manufacturing. All patient questions answered appropriately and within scope of practice.            Mobility:   Bed Mobility comments: Supine to sit with SBA, HOB elevated and increased time to complete. Pt utilizing LUE for support but protecting R wrist. Sit to supine with Min A for LEs.        Skilled Treatment Performed: see gait goal  PT Post Acute Discharge Recommendations: Skilled PT services indicated, 5x weekly, High intensity  Cognition, Swallow, Speech:   Cognition / Swallow / Speech  Patient's Vision Adequate to Safely Complete Daily Activities: Yes  Patient's Judgement Adequate to Safely Complete Daily Activities: Yes  Patient's Memory Adequate to Safely Complete Daily Activities: Yes  Patient Able to Express Needs/Desires: Yes  Patient has speech problem: No           Assistive Devices: Rollator  Precautions:  Safety Interventions  Safety Interventions: low bed, bed alarm  Aspiration Precautions: awake/alert before oral intake  Bleeding Precautions: blood pressure closely monitored  Infection Management: aseptic technique maintained  Isolation Precautions: protective precautions maintained  Latex Precautions: latex precautions maintained    Rehabilitation Plan of Care  - Patient has complex rehab, nursing, and medical needs and may become appropriate for Acute Inpatient Rehabilitation with regular therapy participation / demonstrated progress, transitioned exclusively to oral pain medication, started chemotherapy agent  In AIR the patient would be expected to tolerate minimum of 3  hrs of therapy daily, 5x/week.  -Discussed rehab options today with patient and patient's mother. The patient/family/caregiver was counseled and educated on the purpose of Acute Inpatient Rehabilitation and questions/concerns were addressed.     In order for the patient to be considered for admission to [facility] inpatient rehab, the case manager must give the patient / family choice in post-acute discharge options. If the patient desires [place], a referral from case management is required for acceptance to [facility] inpatient rehab.  A referral for [facility] inpatient rehab has not been received.     Please page the consult pager from 8AM-5PM weekdays or the first call pager after hours/weekends/holidays with questions.    The patient was seen and examined with Dr. Tanda Rockers, MD, who is in agreement with the above stated assessment and plan.    Thank you for this consult    SUBJECTIVE     Reason for Consult: Patient seen in consultation at the request of Kateri Plummer, MD for evaluation of rehabilitation needs and recommendations.    History of Present Illness: [name] Reather Converse is a 22 y.o. female currently hospitalized on 09/25/2022 for ileus and inability to tolerate PO intake.    Today  Doing well this afternoon. States that tone continues to be improved with valium, however was limited by drowsiness yesterday following therapy.  Patient wishing to decrease daytime and nighttime dose due to drowsiness.  Notes that she was able to ambulate with therapy and is continue to make small progress.  Continues to work on plan for nutrition on discharge.  Discussed decreasing IV pain medication frequency prior to AIR admission.    Medical / Surgical History:   Past Medical History:   Diagnosis Date    Abdominal pain     Acid reflux     occas    Anesthesia complication     itching, shaking, coldness; last few surgeries have gone much better    Cancer (CMS-HCC)     Cataract of right eye     COVID-19 virus infection 01/2019    Cyst of thyroid determined by ultrasound     monitoring    [name]     2 right forearm, 1 left thigh, 1 right scapula, 1 under left clavicle; multiple    Difficult intravenous access     [name] (familial adenomatous polyposis)     [name]     Gastric polyps     History of chemotherapy     last treatment approx 05/2019    History of colon polyps     History of COVID-19 01/2019    Iron deficiency anemia due to chronic blood loss     received iron infusion 11-2019    PONV (postoperative nausea and vomiting)     Rectal bleeding     Syncopal episodes     especially if becoming dehydrated     Past Surgical History:   Procedure Laterality Date    COLON SURGERY      cyroablation      cystis removal      desmoid removal      PR CLOSE ENTEROSTOMY,RESEC+ANAST N/A 10/09/2020    Procedure: ILEOSTOMY TAKEDOWN;  Surgeon: Mickle Asper, MD;  Location: OR [place];  Service: General Surgery    PR COLONOSCOPY W/BIOPSY SINGLE/MULTIPLE N/A 10/27/2012    Procedure: COLONOSCOPY, FLEXIBLE, PROXIMAL TO SPLENIC FLEXURE; WITH BIOPSY, SINGLE OR MULTIPLE;  Surgeon: Shirlyn Goltz Mir, MD;  Location: PEDS PROCEDURE ROOM [facility];  Service: Gastroenterology  PR COLONOSCOPY W/BIOPSY SINGLE/MULTIPLE N/A 09/14/2013    Procedure: COLONOSCOPY, FLEXIBLE, PROXIMAL TO SPLENIC FLEXURE; WITH BIOPSY, SINGLE OR MULTIPLE;  Surgeon: Shirlyn Goltz Mir, MD;  Location: PEDS PROCEDURE ROOM [facility];  Service: Gastroenterology    PR COLONOSCOPY W/BIOPSY SINGLE/MULTIPLE N/A 11/08/2014    Procedure: COLONOSCOPY, FLEXIBLE, PROXIMAL TO SPLENIC FLEXURE; WITH BIOPSY, SINGLE OR MULTIPLE;  Surgeon: Arnold Long Mir, MD;  Location: PEDS PROCEDURE ROOM [facility];  Service: Gastroenterology    PR COLONOSCOPY W/BIOPSY SINGLE/MULTIPLE N/A 12/26/2015    Procedure: COLONOSCOPY, FLEXIBLE, PROXIMAL TO SPLENIC FLEXURE; WITH BIOPSY, SINGLE OR MULTIPLE;  Surgeon: Arnold Long Mir, MD;  Location: PEDS PROCEDURE ROOM [facility];  Service: Gastroenterology    PR COLONOSCOPY W/BIOPSY SINGLE/MULTIPLE N/A 09/02/2017    Procedure: COLONOSCOPY, FLEXIBLE, PROXIMAL TO SPLENIC FLEXURE; WITH BIOPSY, SINGLE OR MULTIPLE;  Surgeon: Arnold Long Mir, MD;  Location: PEDS PROCEDURE ROOM [place];  Service: Gastroenterology    PR COLSC FLX W/REMOVAL LESION BY HOT BX FORCEPS N/A 08/27/2016    Procedure: COLONOSCOPY, FLEXIBLE, PROXIMAL TO SPLENIC FLEXURE; W/REMOVAL TUMOR/POLYP/OTHER LESION, HOT BX FORCEP/CAUTE;  Surgeon: Arnold Long Mir, MD;  Location: PEDS PROCEDURE ROOM [facility];  Service: Gastroenterology    PR COLSC FLX W/RMVL OF TUMOR POLYP LESION SNARE TQ N/A 02/25/2019    Procedure: COLONOSCOPY FLEX; W/REMOV TUMOR/LES BY SNARE;  Surgeon: Helyn Numbers, MD;  Location: GI PROCEDURES [place] [facility];  Service: Gastroenterology    PR COLSC FLX W/RMVL OF TUMOR POLYP LESION SNARE TQ N/A 03/13/2020    Procedure: COLONOSCOPY FLEX; W/REMOV TUMOR/LES BY SNARE;  Surgeon: Helyn Numbers, MD;  Location: [facility]EDURES [place] [facility];  Service: Gastroenterology    PR EXC SKIN BENIG 2.1-3 CM TRUNK,ARM,LEG Right 02/25/2017    Procedure: EXCISION, BENIGN LESION INCLUDE MARGINS, EXCEPT SKIN TAG, LEGS; EXCISED DIAMETER 2.1 TO 3.0 CM;  Surgeon: Clarene Duke, MD;  Location: CHILDRENS OR [facility];  Service: Plastics    PR EXC SKIN BENIG 3.1-4 CM TRUNK,ARM,LEG Right 02/25/2017    Procedure: EXCISION, BENIGN LESION INCLUDE MARGINS, EXCEPT SKIN TAG, ARMS; EXCISED DIAMETER 3.1 TO 4.0 CM;  Surgeon: Clarene Duke, MD;  Location: CHILDRENS OR [facility];  Service: Plastics    PR EXC SKIN BENIG >4 CM FACE,FACIAL Right 02/25/2017    Procedure: EXCISION, OTHER BENIGN LES INCLUD MARGIN, FACE/EARS/EYELIDS/NOSE/LIPS/MUCOUS MEMBRANE; EXCISED DIAM >4.0 CM;  Surgeon: Clarene Duke, MD;  Location: CHILDRENS OR [facility];  Service: Plastics    PR EXC TUMOR SOFT TISSUE LEG/ANKLE SUBQ 3+CM Right 08/05/2019    Procedure: EXCISION, TUMOR, SOFT TISSUE OF LEG OR ANKLE AREA, SUBCUTANEOUS; 3 CM OR GREATER;  Surgeon: Arsenio Katz, MD;  Location: MAIN OR [place];  Service: Plastics    PR EXC TUMOR SOFT TISSUE LEG/ANKLE SUBQ <3CM Right 08/05/2019    Procedure: EXCISION, TUMOR, SOFT TISSUE OF LEG OR ANKLE AREA, SUBCUTANEOUS; LESS THAN 3 CM;  Surgeon: Arsenio Katz, MD;  Location: MAIN OR [facility];  Service: Plastics    PR LAP, SURG PROCTECTOMY W J-POUCH N/A 08/10/2020    Procedure: ROBOTIC ASSISTED LAPAROSCOPIC PROCTOCOLECTOMY, ILEAL J POUCH, WITH OSTOMY;  Surgeon: Mickle Asper, MD;  Location: OR [place];  Service: General Surgery    PR NDSC EVAL INTSTINAL POUCH DX W/COLLJ SPEC SPX N/A 01/23/2021    Procedure: ENDO EVAL SM INTEST POUCH; DX;  Surgeon: Modena Nunnery, MD;  Location: [facility]EDURES [place] [facility];  Service: Gastroenterology    PR NDSC EVAL INTSTINAL POUCH DX W/COLLJ SPEC SPX N/A 08/27/2021    Procedure: ENDO EVAL SM INTEST POUCH;  DX;  Surgeon: Hunt Oris, MD;  Location: GI PROCEDURES [facility];  Service: Gastroenterology    PR NDSC EVAL INTSTINAL POUCH DX W/COLLJ SPEC SPX N/A 12/09/2021    Procedure: ENDO EVAL SM INTEST POUCH; DX;  Surgeon: Vidal Schwalbe, MD;  Location: GI PROCEDURES [facility];  Service: Gastroenterology    PR NDSC EVAL INTSTINAL POUCH DX W/COLLJ [facility] SPX Left 04/09/2022    Procedure: ENDO EVAL SM INTEST POUCH; DX;  Surgeon: Modena Nunnery, MD;  Location: [place] PROCEDURES [place] [facility];  Service: Gastroenterology    PR NDSC EVAL INTSTINAL POUCH DX W/COLLJ SPEC SPX N/A 08/05/2022    Procedure: ENDO EVAL SM INTEST POUCH; DX;  Surgeon: Modena Nunnery, MD;  Location: GI PROCEDURES [facility];  Service: Gastroenterology    PR NDSC EVAL INTSTINAL POUCH W/BX SINGLE/MULTIPLE N/A 01/20/2022    Procedure: ENDOSCOPIC EVAL OF SMALL INTESTINAL POUCH; DIAGNOSTIC, No biopsies;  Surgeon: Andrey Farmer, MD;  Location: GI PROCEDURES [facility];  Service: Gastroenterology    PR NDSC EVAL INTSTINAL POUCH W/BX SINGLE/MULTIPLE N/A 02/13/2022    Procedure: ENDOSCOPIC EVAL OF SMALL INTESTINAL POUCH; DIAGNOSTIC, WITH BIOPSY;  Surgeon: Bronson Curb, MD;  Location: GI PROCEDURES [facility];  Service: Gastroenterology    PR UNLISTED PROCEDURE SMALL INTESTINE  01/23/2021    Procedure: UNLISTED PROCEDURE, SMALL INTESTINE;  Surgeon: Modena Nunnery, MD;  Location: [facility] PROCEDURES [place] [facility];  Service: Gastroenterology    PR UNLISTED PROCEDURE SMALL INTESTINE  02/13/2022    Procedure: UNLISTED PROCEDURE, SMALL INTESTINE;  Surgeon: Bronson Curb, MD;  Location: GI PROCEDURES [facility];  Service: Gastroenterology    PR UPPER GI ENDOSCOPY,BIOPSY N/A 10/27/2012    Procedure: UGI ENDOSCOPY; WITH BIOPSY, SINGLE OR MULTIPLE;  Surgeon: Shirlyn Goltz Mir, MD;  Location: PEDS PROCEDURE ROOM [facility];  Service: Gastroenterology    PR UPPER GI ENDOSCOPY,BIOPSY N/A 09/14/2013    Procedure: UGI ENDOSCOPY; WITH BIOPSY, SINGLE OR MULTIPLE;  Surgeon: Shirlyn Goltz Mir, MD;  Location: PEDS PROCEDURE ROOM [facility];  Service: Gastroenterology    PR UPPER GI ENDOSCOPY,BIOPSY N/A 11/08/2014    Procedure: UGI ENDOSCOPY; WITH BIOPSY, SINGLE OR MULTIPLE;  Surgeon: Arnold Long Mir, MD;  Location: PEDS PROCEDURE ROOM [facility];  Service: Gastroenterology    PR UPPER GI ENDOSCOPY,BIOPSY N/A 12/26/2015    Procedure: UGI ENDOSCOPY; WITH BIOPSY, SINGLE OR MULTIPLE;  Surgeon: Arnold Long Mir, MD;  Location: PEDS PROCEDURE ROOM [facility];  Service: Gastroenterology    PR UPPER GI ENDOSCOPY,BIOPSY N/A 08/27/2016    Procedure: UGI ENDOSCOPY; WITH BIOPSY, SINGLE OR MULTIPLE;  Surgeon: Arnold Long Mir, MD;  Location: PEDS PROCEDURE ROOM [facility];  Service: Gastroenterology    PR UPPER GI ENDOSCOPY,BIOPSY N/A 09/02/2017    Procedure: UGI ENDOSCOPY; WITH BIOPSY, SINGLE OR MULTIPLE;  Surgeon: Arnold Long Mir, MD;  Location: PEDS PROCEDURE ROOM [facility];  Service: Gastroenterology    PR UPPER GI ENDOSCOPY,BIOPSY N/A 03/13/2020    Procedure: UGI ENDOSCOPY; WITH BIOPSY, SINGLE OR MULTIPLE;  Surgeon: Helyn Numbers, MD;  Location: GI PROCEDURES [place] [facility];  Service: Gastroenterology    PR UPPER GI ENDOSCOPY,BIOPSY N/A 09/05/2021    Procedure: UGI ENDOSCOPY; WITH BIOPSY, SINGLE OR MULTIPLE;  Surgeon: Wendall Papa, MD;  Location: GI PROCEDURES [facility];  Service: Gastroenterology    PR UPPER GI ENDOSCOPY,DIAGNOSIS N/A 01/20/2022    Procedure: UGI ENDO, INCLUDE ESOPHAGUS, STOMACH, & DUODENUM &/OR JEJUNUM; DX W/WO COLLECTION SPECIMN, BY BRUSH OR WASH;  Surgeon: Andrey Farmer, MD;  Location: GI PROCEDURES [facility];  Service: Gastroenterology    TUMOR REMOVAL      multiple-head, neck, back, hand, right flank, multiple        Social History:   Social History     Tobacco Use    Smoking status: Never     Passive exposure: Past    Smokeless tobacco: Never   Vaping Use    Vaping status: Never Used   Substance Use Topics    Alcohol use: Never    Drug use: Never       Living Environment: House  Lives With: Mother, Father  Home Living: Multi-level home, Bed/bath upstairs, 1/2 bath on main level, Stairs to alternate level with rails, Stairs to enter without rails, Stairs to alternate level without rails, Handicapped height toilet, Tub/shower unit, Able to Live on main level with bedroom/bathroom  Number of Stairs to Enter (outside): 2  Rail placement (inside): Rail on right side  Number of Stairs to Alternate level (inside): 14  Caregiver Identified?: Yes  Caregiver Availability: 24 hours  Caregiver Ability: Limited lifting    Family History: Reviewed  family history includes Alcohol abuse in her paternal grandfather; Arthritis in her maternal grandfather; Asthma in her maternal grandfather; COPD in her paternal grandmother; Cancer in her maternal grandmother; Diabetes in her maternal grandmother; Hypertension in her maternal grandmother; Miscarriages / [place] in her paternal grandmother; No Known Problems in her brother, father, maternal aunt, maternal uncle, mother, paternal aunt, paternal uncle, and sister; Other in her maternal grandmother; Stroke in her maternal grandmother; Thyroid disease in her maternal grandmother.    Allergies:   Adhesive tape-silicones; Ferrlecit [sodium ferric gluconat-sucrose]; Levofloxacin; Methylnaltrexone; Neomycin; Papaya; Morphine; Zosyn [piperacillin-tazobactam]; Compazine [prochlorperazine]; Iron analogues; Iron dextran; and Latex, natural rubber    Medications:   Scheduled    baclofen (LIORESAL) tablet 20 mg TID    buprenorphine-naloxone (SUBOXONE) 2-0.5 mg SL film 0.5 mg of buprenorphine TID    carboxymethylcellulose sodium (REFRESH CELLUVISC) 1 % ophthalmic gel 1 drop QID    ceFAZolin (ANCEF) IVPB 2 g in 50 ml dextrose (premix) Q8H    cetirizine (ZYRTEC) tablet 10 mg Daily    diazePAM (VALIUM) tablet 2.5 mg Daily    And    diazePAM (VALIUM) tablet 5 mg Daily    And    diazePAM (VALIUM) tablet 7.5 mg Nightly    diclofenac sodium (VOLTAREN) 1 % gel 2 g QID    famotidine (PEPCID) tablet 20 mg Nightly    fluconazole (DIFLUCAN) 2 mg/mL in sodium chloride 0.9% 400 mg Q24H    gabapentin (NEURONTIN) capsule 100 mg TID    mirtazapine (REMERON) tablet 15 mg Nightly    multivitamin with folic acid 400 mcg tablet 1 tablet Daily    naloxegol (MOVANTIK) 12.5 mg tablet 12.5 mg Daily    [Provider Hold] nirogacestat (OGSIVEO) tablet 50 mg *Patient Supplied* BID    pantoprazole (Protonix) EC tablet 40 mg BID     PRN acetaminophen, 650 mg, Q6H PRN  alteplase, 2 mg, Once PRN  alum-mag-simeth, 60 mL, Q6H PRN  bisacodyl, 10 mg, BID PRN  carboxymethylcellulose sodium, 2 drop, QID PRN  Chemo Clarification Order, , Continuous PRN  guaiFENesin, 200 mg, Q4H PRN  hydrocortisone, , BID PRN  HYDROmorphone, 0.5 mg, Q3H PRN  lidocaine, , TID PRN  lidocaine-diphenhydrAMINE-aluminum-magnesium, 10 mL, QID PRN  ondansetron, 4 mg, Q12H PRN  oxyCODONE, 10 mg, Q4H PRN   Or  oxyCODONE, 15 mg, Q4H PRN  phenol, 2 spray, Q2H PRN  polyethylene glycol, 17 g,  BID PRN  promethazine, 6.5 mg, Q6H PRN  pseudoephedrine, 30 mg, Daily PRN  senna, 1 tablet, BID PRN  zinc oxide-cod liver oil, , Daily PRN      Continuous Infusions Chemo Clarification Order  sodium chloride        OBJECTIVE     Vitals:  Temp:  [36.5 ??C (97.7 ??F)-36.7 ??C (98.1 ??F)] 36.6 ??C (97.9 ??F)  Heart Rate:  [73-156] 73  Resp:  [12-17] 17  BP: (107-123)/(62-78) 108/62  MAP (mmHg):  [75-90] 75  SpO2:  [99 %-100 %] 100 %  Vitals:    11/06/22 2223 11/07/22 0034 11/07/22 0300 11/07/22 0745   BP: 118/76  123/62 108/62   Pulse:  83 94 73   Resp: 17  17 17    Temp: 36.7 ??C (98.1 ??F)  36.6 ??C (97.9 ??F) 36.6 ??C (97.9 ??F)   TempSrc: Oral  Oral    SpO2:  100% 100% 100%   Weight:       Height:           Patient Lines/Drains/Airways Status       Active Peripheral & Central Intravenous Access       Name Placement date Placement time Site Days    CVC Triple Lumen 10/22/22 Non-tunneled Right Internal jugular 10/22/22  1521  Internal jugular  15                  Patient Lines/Drains/Airways Status       Active Wounds       None                    Physical Exam:    GEN: Lying in bed in NAD.  HEENT: Normocephalic. Moist mucous membranes. NGT in place  RESP: NWOB on supplemental O2.   CV: Warm and well-perfused.  GI: non distended   GU: deferred  SKIN: without lesions, bruises, ecchymosis, rashes on clothed examination  MSK: TTP to bilateral knees (R>L) with passive/active range of motion   NEURO:  Mental Status: Alert, attention intact, speech fluid and coherent, follows commands well and answers questions appropriately  Sensory: Sensory is intact to light touch with exception of left ulnar distribution involving forearm & 4/5th digits  Motor:      Delt Bi Tri WE HG  RUE 5 5 5 5 4     LUE 5 5 5 5 4                  HF KE       DF       PF        RLE     2 2 2 3               LLE   2 2 1 3       Lower extremity exam limited by pain    Abbreviations:   Delt= Deltoid, Bi= Biceps, Tri= Triceps, WF= Wrist flexor, WE= Wrist extensor, HG= Hand Grip  HF= Hip flexor, KE= Knee Extensor, DF= Dorsiflexor, PF= Plantar Flexor, EHL= Extensor Hallicus Longus    Tone: Improved tone most prominent to bilateral lower extremities with hip flexion, knee flexor & knee extension, tone is variable, there is increasd tone to bilateral gastroc-soleus complex  Reflexes: no clonus    Labs and Diagnostic Studies: Reviewed     Radiology Results: Reviewed   Reviewed MRI CTL    ECG 12 Lead    Result Date: 11/02/2022  NORMAL SINUS RHYTHM WITHIN NORMAL  LIMITS WHEN COMPARED WITH ECG OF 21-Oct-2022 14:20, VENT. RATE HAS DECREASED by  54 bpm Confirmed by Schuyler Amor [phone]) on 11/02/2022 3:53:07 PM

## 2022-11-07 NOTE — Unmapped (Signed)
Pt is alert and oriented X4. Pt complained of acute/chronic pain today in the neck, back, and abdomen. Scheduled pain medication was given and ice packs to alleviate pain. Continued plan of care was provided for pt. Family at bedside for duration of shift. All safety and comfort measures were provided for pt during the shift.   Problem: Adult Inpatient Plan of Care  Goal: Plan of Care Review  Outcome: Progressing  Goal: Patient-Specific Goal (Individualized)  Outcome: Progressing  Goal: Absence of Hospital-Acquired Illness or Injury  Outcome: Progressing  Intervention: Identify and Manage Fall Risk  Recent Flowsheet Documentation  Taken 11/06/2022 0715 by Wille Glaser, RN  Safety Interventions: family at bedside  Intervention: Prevent and Manage VTE (Venous Thromboembolism) Risk  Recent Flowsheet Documentation  Taken 11/06/2022 0715 by Wille Glaser, RN  Anti-Embolism Intervention: Refused  Goal: Optimal Comfort and Wellbeing  Outcome: Progressing  Goal: Readiness for Transition of Care  Outcome: Progressing  Goal: Rounds/Family Conference  Outcome: Progressing     Problem: Latex Allergy  Goal: Absence of Allergy Symptoms  Outcome: Progressing     Problem: Fall Injury Risk  Goal: Absence of Fall and Fall-Related Injury  Outcome: Progressing  Intervention: Promote Injury-Free Environment  Recent Flowsheet Documentation  Taken 11/06/2022 0715 by Wille Glaser, RN  Safety Interventions: family at bedside     Problem: Skin Injury Risk Increased  Goal: Skin Health and Integrity  Outcome: Progressing  Intervention: Optimize Skin Protection  Recent Flowsheet Documentation  Taken 11/06/2022 0715 by Wille Glaser, RN  Activity Management: ambulated outside room     Problem: Self-Care Deficit  Goal: Improved Ability to Complete Activities of Daily Living  Outcome: Progressing     Problem: Comorbidity Management  Goal: Blood Glucose Levels Within Targeted Range  Outcome: Progressing  Goal: Blood Pressure in Desired Range  Outcome: Progressing     Problem: Nausea and Vomiting  Goal: Nausea and Vomiting Relief  Outcome: Progressing     Problem: Infection  Goal: Absence of Infection Signs and Symptoms  Outcome: Progressing     Problem: Pain Acute  Goal: Optimal Pain Control and Function  Outcome: Progressing     Problem: Malnutrition  Goal: Improved Nutritional Intake  Outcome: Progressing

## 2022-11-07 NOTE — Unmapped (Addendum)
Patient admitted for post procedural complications and desmoid tumors. Patient drowsy and oriented x4. Vital signs stable on 2L [place]. Patient complains of 10/10 back pain. PRN dilaudid administered as ordered. Patient continues to have 10/10 back pain after OT, MD notified, one time dose of dilaudid ordered. PRN zofran and phenergan administered per order for nausea. NG tube in place and patient tolerating 40 ml/hr feedings well. Patient is x1 assist to bed side commode. Will continue to monitor.     Problem: Adult Inpatient Plan of Care  Goal: Plan of Care Review  Outcome: Progressing  Goal: Patient-Specific Goal (Individualized)  Outcome: Progressing  Goal: Absence of Hospital-Acquired Illness or Injury  Outcome: Progressing  Intervention: Identify and Manage Fall Risk  Recent Flowsheet Documentation  Taken 11/07/2022 1600 by Loleta Rose, RN  Safety Interventions:   fall reduction program maintained   environmental modification   lighting adjusted for tasks/safety   low bed  Taken 11/07/2022 1400 by Loleta Rose, RN  Safety Interventions:   fall reduction program maintained   environmental modification   lighting adjusted for tasks/safety   low bed  Taken 11/07/2022 1200 by Loleta Rose, RN  Safety Interventions:   fall reduction program maintained   environmental modification   lighting adjusted for tasks/safety   low bed  Taken 11/07/2022 1000 by Loleta Rose, RN  Safety Interventions:   fall reduction program maintained   environmental modification   lighting adjusted for tasks/safety   low bed  Taken 11/07/2022 0800 by Loleta Rose, RN  Safety Interventions:   fall reduction program maintained   environmental modification   low bed   lighting adjusted for tasks/safety  Taken 11/07/2022 0757 by Loleta Rose, RN  Safety Interventions:   fall reduction program maintained   environmental modification  Intervention: Prevent Skin Injury  Recent Flowsheet Documentation  Taken 11/07/2022 1600 by Loleta Rose, RN  Positioning for Skin: Sitting in Chair  Taken 11/07/2022 1400 by Loleta Rose, RN  Positioning for Skin: Supine/Back  Taken 11/07/2022 1200 by Loleta Rose, RN  Positioning for Skin: Supine/Back  Taken 11/07/2022 1000 by Loleta Rose, RN  Positioning for Skin: Supine/Back  Taken 11/07/2022 0757 by Loleta Rose, RN  Positioning for Skin: Supine/Back  Device Skin Pressure Protection: absorbent pad utilized/changed  Skin Protection: incontinence pads utilized  Intervention: Prevent and Manage VTE (Venous Thromboembolism) Risk  Recent Flowsheet Documentation  Taken 11/07/2022 0757 by Loleta Rose, RN  Anti-Embolism Device Type: SCD, Knee  Anti-Embolism Intervention: On  Anti-Embolism Device Location: BLE  Intervention: Prevent Infection  Recent Flowsheet Documentation  Taken 11/07/2022 0757 by Loleta Rose, RN  Infection Prevention:   cohorting utilized   environmental surveillance performed   equipment surfaces disinfected   hand hygiene promoted   personal protective equipment utilized   rest/sleep promoted   single patient room provided   visitors restricted/screened  Goal: Optimal Comfort and Wellbeing  Outcome: Progressing  Goal: Readiness for Transition of Care  Outcome: Progressing  Goal: Rounds/Family Conference  Outcome: Progressing     Problem: Latex Allergy  Goal: Absence of Allergy Symptoms  Outcome: Progressing     Problem: Fall Injury Risk  Goal: Absence of Fall and Fall-Related Injury  Outcome: Progressing  Intervention: Promote Injury-Free Environment  Recent Flowsheet Documentation  Taken 11/07/2022 1600 by Loleta Rose, RN  Safety Interventions:   fall reduction program maintained   environmental modification   lighting adjusted for tasks/safety  low bed  Taken 11/07/2022 1400 by Loleta Rose, RN  Safety Interventions:   fall reduction program maintained   environmental modification   lighting adjusted for tasks/safety   low bed  Taken 11/07/2022 1200 by Loleta Rose, RN  Safety Interventions: fall reduction program maintained   environmental modification   lighting adjusted for tasks/safety   low bed  Taken 11/07/2022 1000 by Loleta Rose, RN  Safety Interventions:   fall reduction program maintained   environmental modification   lighting adjusted for tasks/safety   low bed  Taken 11/07/2022 0800 by Loleta Rose, RN  Safety Interventions:   fall reduction program maintained   environmental modification   low bed   lighting adjusted for tasks/safety  Taken 11/07/2022 0757 by Loleta Rose, RN  Safety Interventions:   fall reduction program maintained   environmental modification     Problem: Skin Injury Risk Increased  Goal: Skin Health and Integrity  Outcome: Progressing  Intervention: Optimize Skin Protection  Recent Flowsheet Documentation  Taken 11/07/2022 1600 by Loleta Rose, RN  Pressure Reduction Techniques: frequent weight shift encouraged  Taken 11/07/2022 1400 by Loleta Rose, RN  Pressure Reduction Techniques: frequent weight shift encouraged  Taken 11/07/2022 1200 by Loleta Rose, RN  Pressure Reduction Techniques: frequent weight shift encouraged  Taken 11/07/2022 1000 by Loleta Rose, RN  Pressure Reduction Techniques: frequent weight shift encouraged  Pressure Reduction Devices: pressure-redistributing mattress utilized  Taken 11/07/2022 0757 by Loleta Rose, RN  Pressure Reduction Techniques: frequent weight shift encouraged  Pressure Reduction Devices: pressure-redistributing mattress utilized  Skin Protection: incontinence pads utilized     Problem: Self-Care Deficit  Goal: Improved Ability to Complete Activities of Daily Living  Outcome: Progressing     Problem: Comorbidity Management  Goal: Blood Glucose Levels Within Targeted Range  Outcome: Progressing  Goal: Blood Pressure in Desired Range  Outcome: Progressing     Problem: Nausea and Vomiting  Goal: Nausea and Vomiting Relief  Outcome: Progressing     Problem: Infection  Goal: Absence of Infection Signs and Symptoms  Outcome: Progressing  Intervention: Prevent or Manage Infection  Recent Flowsheet Documentation  Taken 11/07/2022 0757 by Loleta Rose, RN  Infection Management: aseptic technique maintained     Problem: Pain Acute  Goal: Optimal Pain Control and Function  Outcome: Progressing     Problem: Malnutrition  Goal: Improved Nutritional Intake  Outcome: Progressing

## 2022-11-07 NOTE — Unmapped (Signed)
Patient remained alert oriented x . On 2 L Nasal cannula. Vitals stable. Patient remained drowsy throughout the shift. Patient complained of abdomen and back pain throughout he shift, PRN dilaudid asper  order. She also complained of Nausea PRN Zofran and phenergan given as per order. Family at bedside. Patient tolerating tube feed at 40 ml/hr Patient asking for 5 mh Valium at night, pt given 5 mg instead of 7.5 and MD informed.Plan of care continues.     Problem: Adult Inpatient Plan of Care  Goal: Plan of Care Review  Outcome: Progressing  Flowsheets (Taken 11/07/2022 0741)  Progress: improving  Plan of Care Reviewed With: patient  Goal: Patient-Specific Goal (Individualized)  Outcome: Progressing  Goal: Absence of Hospital-Acquired Illness or Injury  Outcome: Progressing  Intervention: Identify and Manage Fall Risk  Recent Flowsheet Documentation  Taken 11/07/2022 0600 by Concha Norway, RN  Safety Interventions:   low bed   bed alarm  Taken 11/07/2022 0400 by Concha Norway, RN  Safety Interventions:   bed alarm   low bed   fall reduction program maintained  Taken 11/07/2022 0200 by Concha Norway, RN  Safety Interventions:   low bed   family at bedside  Taken 11/07/2022 0010 by Concha Norway, RN  Safety Interventions:   low bed   family at bedside   bed alarm  Taken 11/06/2022 2200 by Concha Norway, RN  Safety Interventions:   low bed   family at bedside  Taken 11/06/2022 2000 by Concha Norway, RN  Safety Interventions:   low bed   family at bedside   fall reduction program maintained   enteral feeding safety  Taken 11/06/2022 1925 by Concha Norway, RN  Safety Interventions:   family at bedside   low bed   fall reduction program maintained  Intervention: Prevent and Manage VTE (Venous Thromboembolism) Risk  Recent Flowsheet Documentation  Taken 11/07/2022 0600 by Concha Norway, RN  Anti-Embolism Device Type: SCD, Knee  Anti-Embolism Intervention: On  Taken 11/07/2022 0400 by Concha Norway, RN  Anti-Embolism Device Type: SCD, Knee  Anti-Embolism Intervention: On  Taken 11/07/2022 0200 by Concha Norway, RN  Anti-Embolism Device Type: SCD, Knee  Anti-Embolism Intervention: On  Taken 11/07/2022 0010 by Concha Norway, RN  Anti-Embolism Device Type: SCD, Knee  Anti-Embolism Intervention: On  Taken 11/06/2022 2200 by Concha Norway, RN  Anti-Embolism Device Type: SCD, Knee  Anti-Embolism Intervention: Refused  Taken 11/06/2022 2000 by Concha Norway, RN  Anti-Embolism Device Type: SCD, Knee  Anti-Embolism Intervention: Refused  Taken 11/06/2022 1925 by Concha Norway, RN  Anti-Embolism Device Type: SCD, Knee  Anti-Embolism Intervention: Refused  Intervention: Prevent Infection  Recent Flowsheet Documentation  Taken 11/06/2022 2000 by Concha Norway, RN  Infection Prevention: hand hygiene promoted  Taken 11/06/2022 1925 by Concha Norway, RN  Infection Prevention:   personal protective equipment utilized   hand hygiene promoted  Goal: Optimal Comfort and Wellbeing  Outcome: Progressing  Goal: Readiness for Transition of Care  Outcome: Progressing  Goal: Rounds/Family Conference  Outcome: Progressing     Problem: Latex Allergy  Goal: Absence of Allergy Symptoms  Outcome: Progressing     Problem: Fall Injury Risk  Goal: Absence of Fall and Fall-Related Injury  Outcome: Progressing  Intervention: Promote Injury-Free Environment  Recent Flowsheet Documentation  Taken 11/07/2022 0600 by Concha Norway, RN  Safety Interventions:   low bed   bed alarm  Taken 11/07/2022 0400  by Concha Norway, RN  Safety Interventions:   bed alarm   low bed   fall reduction program maintained  Taken 11/07/2022 0200 by Concha Norway, RN  Safety Interventions:   low bed   family at bedside  Taken 11/07/2022 0010 by Concha Norway, RN  Safety Interventions:   low bed   family at bedside   bed alarm  Taken 11/06/2022 2200 by Concha Norway, RN  Safety Interventions:   low bed   family at bedside  Taken 11/06/2022 2000 by Concha Norway, RN  Safety Interventions:   low bed   family at bedside   fall reduction program maintained   enteral feeding safety  Taken 11/06/2022 1925 by Concha Norway, RN  Safety Interventions:   family at bedside   low bed   fall reduction program maintained     Problem: Skin Injury Risk Increased  Goal: Skin Health and Integrity  Outcome: Progressing     Problem: Self-Care Deficit  Goal: Improved Ability to Complete Activities of Daily Living  Outcome: Progressing     Problem: Comorbidity Management  Goal: Blood Glucose Levels Within Targeted Range  Outcome: Progressing  Goal: Blood Pressure in Desired Range  Outcome: Progressing     Problem: Nausea and Vomiting  Goal: Nausea and Vomiting Relief  Outcome: Progressing     Problem: Infection  Goal: Absence of Infection Signs and Symptoms  Outcome: Progressing  Intervention: Prevent or Manage Infection  Recent Flowsheet Documentation  Taken 11/06/2022 2000 by Concha Norway, RN  Infection Management: aseptic technique maintained  Taken 11/06/2022 1925 by Concha Norway, RN  Infection Management: aseptic technique maintained     Problem: Pain Acute  Goal: Optimal Pain Control and Function  Outcome: Progressing     Problem: Malnutrition  Goal: Improved Nutritional Intake  Outcome: Progressing

## 2022-11-08 LAB — COMPREHENSIVE METABOLIC PANEL
ALBUMIN: 2.9 g/dL — ABNORMAL LOW (ref 3.4–5.0)
ALKALINE PHOSPHATASE: 185 U/L — ABNORMAL HIGH (ref 46–116)
ALT (SGPT): 90 U/L — ABNORMAL HIGH (ref 10–49)
ANION GAP: 5 mmol/L (ref 5–14)
AST (SGOT): 88 U/L — ABNORMAL HIGH (ref <=34)
BILIRUBIN TOTAL: 0.2 mg/dL — ABNORMAL LOW (ref 0.3–1.2)
BLOOD UREA NITROGEN: 9 mg/dL (ref 9–23)
BUN / CREAT RATIO: 20
CALCIUM: 9.5 mg/dL (ref 8.7–10.4)
CHLORIDE: 104 mmol/L (ref 98–107)
CO2: 32 mmol/L — ABNORMAL HIGH (ref 20.0–31.0)
CREATININE: 0.45 mg/dL — ABNORMAL LOW
EGFR CKD-EPI (2021) FEMALE: >90 mL/min/{1.73_m2} (ref >=60)
GLUCOSE RANDOM: 112 mg/dL (ref 70–179)
POTASSIUM: 3.8 mmol/L (ref 3.4–4.8)
PROTEIN TOTAL: 5.9 g/dL (ref 5.7–8.2)
SODIUM: 141 mmol/L (ref 135–145)

## 2022-11-08 LAB — CBC W/ AUTO DIFF
BASOPHILS ABSOLUTE COUNT: 0 10*9/L (ref 0.0–0.1)
BASOPHILS RELATIVE PERCENT: 0.6 %
EOSINOPHILS ABSOLUTE COUNT: 0.2 10*9/L (ref 0.0–0.5)
EOSINOPHILS RELATIVE PERCENT: 3.3 %
HEMATOCRIT: 29 % — ABNORMAL LOW (ref 34.0–44.0)
HEMOGLOBIN: 9.6 g/dL — ABNORMAL LOW (ref 11.3–14.9)
LYMPHOCYTES ABSOLUTE COUNT: 1.8 10*9/L (ref 1.1–3.6)
LYMPHOCYTES RELATIVE PERCENT: 36.3 %
MEAN CORPUSCULAR HEMOGLOBIN CONC: 33.2 g/dL (ref 32.0–36.0)
MEAN CORPUSCULAR HEMOGLOBIN: 27.5 pg (ref 25.9–32.4)
MEAN CORPUSCULAR VOLUME: 82.8 fL (ref 77.6–95.7)
MEAN PLATELET VOLUME: 10.6 fL (ref 6.8–10.7)
MONOCYTES ABSOLUTE COUNT: 0.7 10*9/L (ref 0.3–0.8)
MONOCYTES RELATIVE PERCENT: 14.7 %
NEUTROPHILS ABSOLUTE COUNT: 2.3 10*9/L (ref 1.8–7.8)
NEUTROPHILS RELATIVE PERCENT: 45.1 %
PLATELET COUNT: 166 10*9/L (ref 150–450)
RED BLOOD CELL COUNT: 3.5 10*12/L — ABNORMAL LOW (ref 3.95–5.13)
RED CELL DISTRIBUTION WIDTH: 17 % — ABNORMAL HIGH (ref 12.2–15.2)
WBC ADJUSTED: 5.1 10*9/L (ref 3.6–11.2)

## 2022-11-08 MED ADMIN — baclofen (LIORESAL) tablet 20 mg: 20 mg | ORAL | @ 19:00:00

## 2022-11-08 MED ADMIN — diclofenac sodium (VOLTAREN) 1 % gel 2 g: 2 g | TOPICAL | @ 15:00:00

## 2022-11-08 MED ADMIN — polyethylene glycol (MIRALAX) packet 17 g: 17 g | ORAL | @ 04:00:00

## 2022-11-08 MED ADMIN — gabapentin (NEURONTIN) capsule 100 mg: 100 mg | ORAL | @ 03:00:00

## 2022-11-08 MED ADMIN — baclofen (LIORESAL) tablet 20 mg: 20 mg | ORAL | @ 14:00:00

## 2022-11-08 MED ADMIN — pantoprazole (Protonix) EC tablet 40 mg: 40 mg | ORAL | @ 14:00:00

## 2022-11-08 MED ADMIN — diclofenac sodium (VOLTAREN) 1 % gel 2 g: 2 g | TOPICAL | @ 09:00:00

## 2022-11-08 MED ADMIN — carboxymethylcellulose sodium (REFRESH CELLUVISC) 1 % ophthalmic gel 1 drop: 1 [drp] | OPHTHALMIC | @ 01:00:00

## 2022-11-08 MED ADMIN — buprenorphine-naloxone (SUBOXONE) 2-0.5 mg SL film 0.5 mg of buprenorphine: .25 | SUBLINGUAL | @ 14:00:00

## 2022-11-08 MED ADMIN — gabapentin (NEURONTIN) capsule 100 mg: 100 mg | ORAL | @ 19:00:00

## 2022-11-08 MED ADMIN — oxyCODONE (ROXICODONE) immediate release tablet 15 mg: 15 mg | ORAL | @ 14:00:00 | Stop: 2022-11-14

## 2022-11-08 MED ADMIN — famotidine (PEPCID) tablet 20 mg: 20 mg | ORAL | @ 01:00:00

## 2022-11-08 MED ADMIN — buprenorphine-naloxone (SUBOXONE) 2-0.5 mg SL film 0.5 mg of buprenorphine: .25 | SUBLINGUAL | @ 01:00:00

## 2022-11-08 MED ADMIN — ondansetron (ZOFRAN) injection 4 mg: 4 mg | INTRAVENOUS | @ 05:00:00

## 2022-11-08 MED ADMIN — HYDROmorphone (PF) (DILAUDID) injection 0.5 mg: .5 mg | INTRAVENOUS | @ 17:00:00 | Stop: 2022-11-12

## 2022-11-08 MED ADMIN — diazePAM (VALIUM) tablet 2.5 mg: 2.5 mg | ORAL | @ 14:00:00

## 2022-11-08 MED ADMIN — HYDROmorphone (PF) (DILAUDID) injection 0.5 mg: .5 mg | INTRAVENOUS | @ 09:00:00 | Stop: 2022-11-08

## 2022-11-08 MED ADMIN — ceFAZolin (ANCEF) IVPB 2 g in 50 ml dextrose (premix): 2 g | INTRAVENOUS | @ 19:00:00 | Stop: 2022-11-08

## 2022-11-08 MED ADMIN — diclofenac sodium (VOLTAREN) 1 % gel 2 g: 2 g | TOPICAL | @ 03:00:00

## 2022-11-08 MED ADMIN — diclofenac sodium (VOLTAREN) 1 % gel 2 g: 2 g | TOPICAL | @ 23:00:00

## 2022-11-08 MED ADMIN — ceFAZolin (ANCEF) IVPB 2 g in 50 ml dextrose (premix): 2 g | INTRAVENOUS | @ 03:00:00 | Stop: 2022-11-09

## 2022-11-08 MED ADMIN — HYDROmorphone (PF) (DILAUDID) injection 0.5 mg: .5 mg | INTRAVENOUS | @ 01:00:00 | Stop: 2022-11-08

## 2022-11-08 MED ADMIN — carboxymethylcellulose sodium (REFRESH CELLUVISC) 1 % ophthalmic gel 1 drop: 1 [drp] | OPHTHALMIC | @ 23:00:00

## 2022-11-08 MED ADMIN — pantoprazole (Protonix) EC tablet 40 mg: 40 mg | ORAL | @ 01:00:00

## 2022-11-08 MED ADMIN — HYDROmorphone (PF) (DILAUDID) injection 0.5 mg: .5 mg | INTRAVENOUS | @ 23:00:00 | Stop: 2022-11-12

## 2022-11-08 MED ADMIN — HYDROmorphone (PF) (DILAUDID) injection 0.5 mg: .5 mg | INTRAVENOUS | @ 04:00:00 | Stop: 2022-11-08

## 2022-11-08 MED ADMIN — carboxymethylcellulose sodium (REFRESH CELLUVISC) 1 % ophthalmic gel 1 drop: 1 [drp] | OPHTHALMIC | @ 17:00:00

## 2022-11-08 MED ADMIN — multivitamin with folic acid 400 mcg tablet 1 tablet: 1 | ORAL | @ 14:00:00

## 2022-11-08 MED ADMIN — promethazine (PHENERGAN) 6.5 mg in sodium chloride (NS) 0.9 % 50 mL IVPB: 6.5 mg | INTRAVENOUS | @ 09:00:00

## 2022-11-08 MED ADMIN — HYDROmorphone (PF) (DILAUDID) injection 0.5 mg: .5 mg | INTRAVENOUS | @ 20:00:00 | Stop: 2022-11-12

## 2022-11-08 MED ADMIN — gabapentin (NEURONTIN) capsule 100 mg: 100 mg | ORAL | @ 14:00:00

## 2022-11-08 MED ADMIN — ondansetron (ZOFRAN) injection 4 mg: 4 mg | INTRAVENOUS | @ 14:00:00

## 2022-11-08 MED ADMIN — baclofen (LIORESAL) tablet 20 mg: 20 mg | ORAL | @ 01:00:00

## 2022-11-08 MED ADMIN — ceFAZolin (ANCEF) IVPB 2 g in 50 ml dextrose (premix): 2 g | INTRAVENOUS | @ 14:00:00 | Stop: 2022-11-08

## 2022-11-08 MED ADMIN — cetirizine (ZYRTEC) tablet 10 mg: 10 mg | GASTROENTERAL | @ 14:00:00

## 2022-11-08 MED ADMIN — diazePAM (VALIUM) tablet 5 mg: 5 mg | ORAL | @ 03:00:00

## 2022-11-08 MED ADMIN — buprenorphine-naloxone (SUBOXONE) 2-0.5 mg SL film 0.5 mg of buprenorphine: .25 | SUBLINGUAL | @ 19:00:00

## 2022-11-08 MED ADMIN — naloxegol (MOVANTIK) 12.5 mg tablet 12.5 mg: 12.5 mg | ORAL | @ 14:00:00

## 2022-11-08 MED ADMIN — lactated ringers bolus 1,000 mL: 1000 mL | INTRAVENOUS | @ 20:00:00 | Stop: 2022-11-08

## 2022-11-08 MED ADMIN — diazePAM (VALIUM) tablet 2.5 mg: 2.5 mg | ORAL | @ 20:00:00

## 2022-11-08 MED ADMIN — carboxymethylcellulose sodium (REFRESH CELLUVISC) 1 % ophthalmic gel 1 drop: 1 [drp] | OPHTHALMIC | @ 09:00:00

## 2022-11-08 MED ADMIN — fluconazole (DIFLUCAN) 2 mg/mL in sodium chloride 0.9% 400 mg: 400 mg | INTRAVENOUS | @ 14:00:00 | Stop: 2022-11-08

## 2022-11-08 NOTE — Unmapped (Signed)
Patient alert and oriented x 4 and 1-2 assist to [facility].  Medications administered per order.  Tube feeding running at 68ml/hr, patient unable to tolerate goal rate.  Patient medicated for pain x 2 this shift and medicated for nausea once.  CVAD dressing changed by Charge, RN.  TF tubing changed at 2300.  VSS.    Problem: Adult Inpatient Plan of Care  Goal: Optimal Comfort and Wellbeing  Outcome: Ongoing - Unchanged     Problem: Nausea and Vomiting  Goal: Nausea and Vomiting Relief  Outcome: Ongoing - Unchanged     Problem: Infection  Goal: Absence of Infection Signs and Symptoms  Outcome: Ongoing - Unchanged     Problem: Pain Acute  Goal: Optimal Pain Control and Function  Outcome: Ongoing - Unchanged     Problem: Malnutrition  Goal: Improved Nutritional Intake  Outcome: Ongoing - Unchanged

## 2022-11-08 NOTE — Unmapped (Signed)
Hospital Medicine Daily Progress Note    Assessment/Plan:    Principal Problem:    Other acute postprocedural pain  Active Problems:    [name]    Intestinal polyps    [name]    Neoplasm related pain    Physical deconditioning    History of colectomy    Intractable abdominal pain    Generalized anxiety disorder with panic attacks    SBO (small bowel obstruction) (CMS-HCC)    Small intestinal bacterial overgrowth (SIBO)   Malnutrition Evaluation as performed by RD, LDN: Patient does not meet AND/ASPEN criteria for malnutrition at this time (09/29/22 1439)             [name] [name] is a 22 y.o. female who presented to [facility] with Other acute postprocedural pain.    Enterobacteriaceae group bacteremia, C albicans fungemia  Likely due to RIJ tunneled line placed 01/2022 now removed.  8/13, 8/17 repeat blood cultures NGTD.  No evidence of vitreitis, retinitis 8/15 per Ophtho.  TTE 8/15 w possible vegetations on MV and TV or indwelling catheter; however, TEE 8/20 w/o vegetations.  (Cefepime 8/13-8/20, metronidazole 8/13-8/16, micafungin 8/14-8/22)  - Continue cefazolin (started 8/20), fluconazole (IV given difficulty tolerating PO form) through 8/31, even w slightly elevated LFTs.  - Check CBC w diff weekly, CMP daily while on above.     [name], desmoid fibromatosis.  H/O proctocolectomy, IPAA  Follows w Dr. Theone Stanley [facility]).  S/P planned triamcinolone/bupivacaine injection of 2 RUE desmoid tumors, L chest tumor by IR 7/18, complicated by post-op pain which was initial reason for this stay.  Have intermittently held home nirogacestat for various reasons this stay.  - Per Dr. [name] 8/26, can resume nirogacestat at 50mg  BID when medically feasible.  Can then titrate up to 100mg  BID after 1 week and then to goal 150mg  BID after 1 week per his rec (even though starting at goal dose of 150mg  BID is standard).  Holding off on restarting given elevated LFTs.     Nausea  Improving but not resolved.  Ileus resolved, needed NG decompression earlier this stay w TPN 7/21-8/13.  Baseline necessary opioid use.  H/O TPN dependence.  - Continue ND tube feeds (Osmolite 1.5 61ml/h, free water flush 50ml q3h) though probably needs closer to H2O q3h instead of 50ml q3h but patient trying to prioritize PO intake.   - Continue bisacodyl, PEG, senna PRN for now as is having stools spontaneously.  - Continue naloxegol.  - Allowing IV promethazine PRN.     Generalized anxiety disorder, depression, h/o panic attacks.  - Continue home mirtazapine.  - Since using diazepam (see below), will use this to treat anxiety as well after discussion w Psych 8/27.  Per discussion with patient earlier today, patient preferred to keep dosing at 2.5/5/7.5 but this afternoon PM&R re-evaluated and recommended 2.5/2.5/5 mg with extra 2.5 mg PRN at night.   - If needed could give 2.5mg  PO diazepam for acute anxiety.  - Appreciate Psych???s assistance.     Chronic abd pain 2/2 intra-abd desmoid fibromatosis  Follows w [facility].  Typically on oxycodone 10mg  PRN at home.  S/P lidocaine 7/21-7/24, ketamine 8/1-8/9.  -Continue home buprenorphine-naloxone 0.5-0.125 TID.  -Continue oxycodone 10-15mg  q4h PRN for now.  -Allowing hydromorphone 0.5mg  q3h PRN IV for now given nausea issues and difficulty taking PO meds at times.  -Continue gabapentin.    Low back pain/hip pain  Patient with 2 to 3 days of  central low back pain and mild swelling in that area.  Also accompanied by some hip pain.  Suspect are related to deconditioning and perhaps some muscle spasm as noted below.  Patient is however describing this as an acute change.  No recent imaging to evaluate this, though may consider if persistent in severity.     Muscle spasm, stiffness, deconditioning  MRI C, T, L-spine and MRI brain all w and w/o contrast unremarkable.  7/20 paraneoplastic Ab panel w slightly elevated P/Q-type Ca channel Ab which can be seen w Lambert-Eaton.  However, EMG NL. Autoimmune myelopathy panel neg, including neg for anti-GAD65 Ab's making stiff person syndrom unlikely.  -Continue baclofen for now but might consider starting to wean this.  -Continue diazepam (though was started for possible stiff person syndrome) as this seems to be helping w muscle stiffness as well as anxiety.  -Gabapentin as above.  -Continue PT/OT.     Transaminase elevation  Unclear etiology, possibly related to cefazolin or fluconazole.  Previous transaminase elevation thought to be 2/2 TPN or nirogacestat as LFT rise preceded starting fluconazole.  Liver U/S w Doppler 8/15 unremarkable.  -Continue to monitor daily.     Oral candidiasis.  Resolved.     Fe def anemia.  S/P 1.02g IV Fe (ferumoxytol).  H/O Fe hypersensitivity reactions and evaluated by [facility].     SIBO.  Typically on cefdinir MWF, doxy TTSS at home.     Coffee-ground emesis earlier this stay likely 2/2 NGT.  -Continue PPI BID for now.     Daily checklist:  VTE prophylaxis: Discuss with patient tomorrow, has been on hold in setting of GI bleeding  Diet: Supplement Adult; Ensure Plus High Protein (High Calorie/High Protein); # of Products PER Serving: 1 2xd PC  Osmolite 1.5 (Standard 1.5 Cal) continuous tube feed  Nutrition Therapy Regular/House  Code status: Full Code  HCDM:   HCDM ([place] policy, no known patient preference): Seefeld,[name] - Mother - [phone]    HCDM, back-up (If primary HCDM is unavailable): [name] - Father - [phone]    Disposition: Likely AIR once ready. See checklist from PM&R.   [ ]  CBC/BMP & other pertinent labs within 48 hours of AIR   [ ]  Prefer oral pain meds only, but if on IV pain meds, then no more than q 6-8 hours prn   [ ]  6 minute walk test completed for home oxygen requirement   [ ]  If on oxygen, no more than 4 LPM [place]   [ ]  Chemotherapy plan documented   [ ]  Currently needing TF via NGT. May need to discharge to home with NGT. Feeding plan ongoing   [ ]  Documentation of therapy tolerance I personally spent 50 minutes face-to-face and non-face-to-face in the care of this patient, which includes all pre, intra, and post visit time on the date of service.  All documented time was specific to the E/M visit and does not include any procedures that may have been performed.      ___________________________________________________________________    Subjective:  No significant events overnight.  Patient overall still having intermittent pain though trying to de-escalate from using regular IV Dilaudid.  Nausea is still a big component of her symptoms and the other new complaint over the last couple of days as low back pain and swelling.  She is also concerned about winging of her right scapula that is related to prior desmoid tumor.    Recent Results (from the past 24 hour(s))  Comprehensive Metabolic Panel    Collection Time: 11/07/22  5:33 AM   Result Value Ref Range    Sodium 140 135 - 145 mmol/L    Potassium 3.7 3.4 - 4.8 mmol/L    Chloride 104 98 - 107 mmol/L    CO2 32.0 (H) 20.0 - 31.0 mmol/L    Anion Gap 4 (L) 5 - 14 mmol/L    BUN 7 (L) 9 - 23 mg/dL    Creatinine 1.61 (L) 0.55 - 1.02 mg/dL    BUN/Creatinine Ratio 16     eGFR CKD-EPI (2021) Female >90 >=60 mL/min/1.52m2    Glucose 119 70 - 179 mg/dL    Calcium 9.5 8.7 - 09.6 mg/dL    Albumin 2.9 (L) 3.4 - 5.0 g/dL    Total Protein 5.9 5.7 - 8.2 g/dL    Total Bilirubin 0.2 (L) 0.3 - 1.2 mg/dL    AST 045 (H) <=40 U/L    ALT 121 (H) 10 - 49 U/L    Alkaline Phosphatase 193 (H) 46 - 116 U/L     Labs/Studies:  Labs and Studies from the last 24hrs per EMR and Reviewed    Objective:  Temp:  [36.6 ??C (97.9 ??F)-36.7 ??C (98.1 ??F)] 36.6 ??C (97.9 ??F)  Heart Rate:  [73-104] 92  Resp:  [12-18] 18  BP: (98-123)/(55-76) 98/55  SpO2:  [95 %-100 %] 95 %    General: No acute distress.   HEENT: MMM, oropharynx clear.  Corpak in place  Cardiac: RRR, no M/R/G  Pulmonary: Normal work of breathing, clear bilaterally, no wheezes or crackles  Abdomen: Soft, Not particularly distended or scar tissue present.  Tender to palpation but overall stable  Skin: No jaundice. No rashes or lesions.  Extremities: No edema, cyanosis, or clubbing. Warm  Neuro: CN 2-12 intact, no gross motor deficits  Psych: Oriented to self, place, and time. Normal speech and affect.  Central line in the neck

## 2022-11-09 LAB — COMPREHENSIVE METABOLIC PANEL
ALBUMIN: 3.1 g/dL — ABNORMAL LOW (ref 3.4–5.0)
ALKALINE PHOSPHATASE: 165 U/L — ABNORMAL HIGH (ref 46–116)
ALT (SGPT): 62 U/L — ABNORMAL HIGH (ref 10–49)
ANION GAP: 6 mmol/L (ref 5–14)
AST (SGOT): 53 U/L — ABNORMAL HIGH (ref <=34)
BILIRUBIN TOTAL: 0.2 mg/dL — ABNORMAL LOW (ref 0.3–1.2)
BLOOD UREA NITROGEN: 9 mg/dL (ref 9–23)
BUN / CREAT RATIO: 21
CALCIUM: 9.8 mg/dL (ref 8.7–10.4)
CHLORIDE: 106 mmol/L (ref 98–107)
CO2: 31 mmol/L (ref 20.0–31.0)
CREATININE: 0.43 mg/dL — ABNORMAL LOW
EGFR CKD-EPI (2021) FEMALE: >90 mL/min/{1.73_m2} (ref >=60)
GLUCOSE RANDOM: 109 mg/dL (ref 70–179)
POTASSIUM: 3.7 mmol/L (ref 3.4–4.8)
PROTEIN TOTAL: 5.9 g/dL (ref 5.7–8.2)
SODIUM: 143 mmol/L (ref 135–145)

## 2022-11-09 MED ADMIN — ondansetron (ZOFRAN) injection 4 mg: 4 mg | INTRAVENOUS

## 2022-11-09 MED ADMIN — buprenorphine-naloxone (SUBOXONE) 2-0.5 mg SL film 0.5 mg of buprenorphine: .25 | SUBLINGUAL | @ 19:00:00

## 2022-11-09 MED ADMIN — cetirizine (ZYRTEC) tablet 10 mg: 10 mg | GASTROENTERAL | @ 14:00:00

## 2022-11-09 MED ADMIN — carboxymethylcellulose sodium (REFRESH CELLUVISC) 1 % ophthalmic gel 1 drop: 1 [drp] | OPHTHALMIC | @ 19:00:00

## 2022-11-09 MED ADMIN — promethazine (PHENERGAN) 6.5 mg in sodium chloride (NS) 0.9 % 50 mL IVPB: 6.5 mg | INTRAVENOUS | @ 14:00:00

## 2022-11-09 MED ADMIN — HYDROmorphone (PF) (DILAUDID) injection 0.5 mg: .5 mg | INTRAVENOUS | @ 14:00:00 | Stop: 2022-11-09

## 2022-11-09 MED ADMIN — HYDROmorphone (PF) (DILAUDID) injection 0.5 mg: .5 mg | INTRAVENOUS | Stop: 2022-11-12

## 2022-11-09 MED ADMIN — buprenorphine-naloxone (SUBOXONE) 2-0.5 mg SL film 0.5 mg of buprenorphine: .25 | SUBLINGUAL | @ 14:00:00

## 2022-11-09 MED ADMIN — pantoprazole (Protonix) EC tablet 40 mg: 40 mg | ORAL | @ 02:00:00

## 2022-11-09 MED ADMIN — ondansetron (ZOFRAN) injection 4 mg: 4 mg | INTRAVENOUS | @ 08:00:00

## 2022-11-09 MED ADMIN — carboxymethylcellulose sodium (REFRESH CELLUVISC) 1 % ophthalmic gel 1 drop: 1 [drp] | OPHTHALMIC | @ 10:00:00

## 2022-11-09 MED ADMIN — naloxegol (MOVANTIK) 12.5 mg tablet 12.5 mg: 12.5 mg | ORAL | @ 14:00:00

## 2022-11-09 MED ADMIN — baclofen (LIORESAL) tablet 20 mg: 20 mg | ORAL | @ 02:00:00

## 2022-11-09 MED ADMIN — baclofen (LIORESAL) tablet 20 mg: 20 mg | ORAL | @ 19:00:00

## 2022-11-09 MED ADMIN — multivitamin with folic acid 400 mcg tablet 1 tablet: 1 | ORAL | @ 14:00:00

## 2022-11-09 MED ADMIN — alteplase (ACTIVase) injection small catheter clearance 1 mg: 1 mg | INTRAVENOUS | @ 07:00:00 | Stop: 2022-11-09

## 2022-11-09 MED ADMIN — ceFAZolin (ANCEF) IVPB 2 g in 50 ml dextrose (premix): 2 g | INTRAVENOUS | @ 03:00:00 | Stop: 2022-11-08

## 2022-11-09 MED ADMIN — polyethylene glycol (MIRALAX) packet 17 g: 17 g | ORAL | @ 03:00:00

## 2022-11-09 MED ADMIN — gabapentin (NEURONTIN) capsule 100 mg: 100 mg | ORAL | @ 14:00:00

## 2022-11-09 MED ADMIN — diazePAM (VALIUM) tablet 2.5 mg: 2.5 mg | ORAL | @ 14:00:00

## 2022-11-09 MED ADMIN — diazePAM (VALIUM) tablet 2.5 mg: 2.5 mg | ORAL | @ 19:00:00

## 2022-11-09 MED ADMIN — famotidine (PEPCID) tablet 20 mg: 20 mg | ORAL | @ 02:00:00

## 2022-11-09 MED ADMIN — diclofenac sodium (VOLTAREN) 1 % gel 2 g: 2 g | TOPICAL | @ 10:00:00

## 2022-11-09 MED ADMIN — oxyCODONE (ROXICODONE) immediate release tablet 15 mg: 15 mg | ORAL | @ 19:00:00 | Stop: 2022-11-14

## 2022-11-09 MED ADMIN — HYDROmorphone (PF) (DILAUDID) injection 0.5 mg: .5 mg | INTRAVENOUS | @ 02:00:00 | Stop: 2022-11-12

## 2022-11-09 MED ADMIN — sodium chloride (NS) 0.9 % infusion: 10 mL/h | INTRAVENOUS | @ 07:00:00

## 2022-11-09 MED ADMIN — promethazine (PHENERGAN) 6.5 mg in sodium chloride (NS) 0.9 % 50 mL IVPB: 6.5 mg | INTRAVENOUS | @ 02:00:00

## 2022-11-09 MED ADMIN — carboxymethylcellulose sodium (REFRESH CELLUVISC) 1 % ophthalmic gel 1 drop: 1 [drp] | OPHTHALMIC | @ 02:00:00

## 2022-11-09 MED ADMIN — HYDROmorphone (PF) (DILAUDID) injection 0.5 mg: .5 mg | INTRAVENOUS | @ 08:00:00 | Stop: 2022-11-09

## 2022-11-09 MED ADMIN — diazePAM (VALIUM) tablet 5 mg: 5 mg | ORAL | @ 02:00:00

## 2022-11-09 MED ADMIN — gabapentin (NEURONTIN) capsule 100 mg: 100 mg | ORAL | @ 19:00:00

## 2022-11-09 MED ADMIN — HYDROmorphone (PF) (DILAUDID) injection 0.5 mg: .5 mg | INTRAVENOUS | @ 17:00:00 | Stop: 2022-11-09

## 2022-11-09 MED ADMIN — lidocaine (ASPERCREME) 4 % 2 patch: 2 | TRANSDERMAL | @ 19:00:00

## 2022-11-09 MED ADMIN — pantoprazole (Protonix) EC tablet 40 mg: 40 mg | ORAL | @ 14:00:00

## 2022-11-09 MED ADMIN — buprenorphine-naloxone (SUBOXONE) 2-0.5 mg SL film 0.5 mg of buprenorphine: .25 | SUBLINGUAL | @ 02:00:00

## 2022-11-09 MED ADMIN — baclofen (LIORESAL) tablet 20 mg: 20 mg | ORAL | @ 14:00:00

## 2022-11-09 MED ADMIN — gabapentin (NEURONTIN) capsule 100 mg: 100 mg | ORAL | @ 03:00:00

## 2022-11-09 NOTE — Unmapped (Signed)
Patient alert and oriented x 4 with assist w/ ADL's Medications administered per order. Tube feeds infusing at 76ml/hr, patient unable to tolerate goal. Bolus given for additional fluid. Patient medicated for continued back pain PRN. CVAD dressing C/D/I. VSS.     Problem: Adult Inpatient Plan of Care  Goal: Plan of Care Review  Outcome: Progressing  Goal: Patient-Specific Goal (Individualized)  Outcome: Progressing  Goal: Absence of Hospital-Acquired Illness or Injury  Outcome: Progressing  Intervention: Identify and Manage Fall Risk  Recent Flowsheet Documentation  Taken 11/08/2022 0800 by Almon Hercules, RN  Safety Interventions:   fall reduction program maintained   low bed   environmental modification  Intervention: Prevent Infection  Recent Flowsheet Documentation  Taken 11/08/2022 0800 by Almon Hercules, RN  Infection Prevention:   hand hygiene promoted   personal protective equipment utilized   rest/sleep promoted   single patient room provided  Goal: Optimal Comfort and Wellbeing  Outcome: Progressing  Goal: Readiness for Transition of Care  Outcome: Progressing  Goal: Rounds/Family Conference  Outcome: Progressing     Problem: Latex Allergy  Goal: Absence of Allergy Symptoms  Outcome: Progressing     Problem: Fall Injury Risk  Goal: Absence of Fall and Fall-Related Injury  Outcome: Progressing  Intervention: Promote Scientist, clinical (histocompatibility and immunogenetics) Documentation  Taken 11/08/2022 0800 by Almon Hercules, RN  Safety Interventions:   fall reduction program maintained   low bed   environmental modification     Problem: Skin Injury Risk Increased  Goal: Skin Health and Integrity  Outcome: Progressing     Problem: Self-Care Deficit  Goal: Improved Ability to Complete Activities of Daily Living  Outcome: Progressing     Problem: Comorbidity Management  Goal: Blood Glucose Levels Within Targeted Range  Outcome: Progressing  Goal: Blood Pressure in Desired Range  Outcome: Progressing     Problem: Nausea and Vomiting  Goal: Nausea and Vomiting Relief  Outcome: Progressing     Problem: Infection  Goal: Absence of Infection Signs and Symptoms  Outcome: Progressing  Intervention: Prevent or Manage Infection  Recent Flowsheet Documentation  Taken 11/08/2022 0800 by Almon Hercules, RN  Infection Management: aseptic technique maintained     Problem: Pain Acute  Goal: Optimal Pain Control and Function  Outcome: Progressing     Problem: Malnutrition  Goal: Improved Nutritional Intake  Outcome: Progressing

## 2022-11-09 NOTE — Unmapped (Signed)
Hospital Medicine Daily Progress Note    Assessment/Plan:    Principal Problem:    Other acute postprocedural pain  Active Problems:    [name]    Intestinal polyps    [name]    Neoplasm related pain    Physical deconditioning    History of colectomy    Intractable abdominal pain    Generalized anxiety disorder with panic attacks    SBO (small bowel obstruction) (CMS-HCC)    Small intestinal bacterial overgrowth (SIBO)   Malnutrition Evaluation as performed by RD, LDN: Patient does not meet AND/ASPEN criteria for malnutrition at this time (09/29/22 1439)             [name] [name] is a 22 y.o. female who presented to [facility] with Other acute postprocedural pain.    Enterobacteriaceae group bacteremia, C albicans fungemia  Likely due to RIJ tunneled line placed 01/2022 now removed.  8/13, 8/17 repeat blood cultures NGTD.  No evidence of vitreitis, retinitis 8/15 per Ophtho.  TTE 8/15 w possible vegetations on MV and TV or indwelling catheter; however, TEE 8/20 w/o vegetations.  (Cefepime 8/13-8/20, metronidazole 8/13-8/16, micafungin 8/14-8/22)  - Continue cefazolin (started 8/20), fluconazole (IV given difficulty tolerating PO form) through 8/31, even w slightly elevated LFTs.  - Check CBC w diff weekly, CMP daily while on above.     [name], desmoid fibromatosis.  H/O proctocolectomy, IPAA  Follows w Dr. Theone Stanley [facility]).  S/P planned triamcinolone/bupivacaine injection of 2 RUE desmoid tumors, L chest tumor by IR 7/18, complicated by post-op pain which was initial reason for this stay.  Have intermittently held home nirogacestat for various reasons this stay.  - Per Dr. [name] 8/26, can resume nirogacestat at 50mg  BID when medically feasible.  Can then titrate up to 100mg  BID after 1 week and then to goal 150mg  BID after 1 week per his rec (even though starting at goal dose of 150mg  BID is standard).  Holding off on restarting given elevated LFTs.     Nausea  Improving but not resolved.  Ileus resolved, needed NG decompression earlier this stay w TPN 7/21-8/13.  Baseline necessary opioid use.  H/O TPN dependence.  - Continue ND tube feeds (Osmolite 1.5 33ml/h, free water flush 50ml q3h) though probably needs closer to H2O q3h instead of 50ml q3h but patient trying to prioritize PO intake.   - Continue bisacodyl, PEG, senna PRN for now as is having stools spontaneously.  - Continue naloxegol.  - Allowing IV promethazine PRN.  - 1L fluid bolus today     Generalized anxiety disorder, depression, h/o panic attacks.  - Continue home mirtazapine.  - Since using diazepam (see below), will use this to treat anxiety as well after discussion w Psych 8/27.    - Continue diazepam 2.5/2.5/5 mg with extra 2.5 mg PRN at night   - If needed could give 2.5mg  PO diazepam for acute anxiety.  - Appreciate Psych???s assistance.     Chronic abd pain 2/2 intra-abd desmoid fibromatosis  Follows w [facility].  Typically on oxycodone 10mg  PRN at home.  S/P lidocaine 7/21-7/24, ketamine 8/1-8/9.  -Continue home buprenorphine-naloxone 0.5-0.125 TID.  -Continue oxycodone 10-15mg  q4h PRN for now.  -Allowing hydromorphone 0.5mg  q3h PRN IV for now given nausea issues and difficulty taking PO meds at times. Patient not ready to wean yet. Perhaps to consider spacing tomorrow.  -Continue gabapentin.    Low back pain/hip pain  Patient with 2 to 3  days of central low back pain and mild swelling in that area.  Also accompanied by some hip pain.  Suspect are related to deconditioning and perhaps some muscle spasm as noted below.  Patient is however describing this as an acute change after more intense neuro exam a few days ago.  No recent imaging to evaluate this, though may consider if persistent in severity.     Muscle spasm, stiffness, deconditioning  MRI C, T, L-spine and MRI brain all w and w/o contrast unremarkable.  7/20 paraneoplastic Ab panel w slightly elevated P/Q-type Ca channel Ab which can be seen w Lambert-Eaton. However, EMG NL.  Autoimmune myelopathy panel neg, including neg for anti-GAD65 Ab's making stiff person syndrom unlikely.  -Continue baclofen for now but might consider starting to wean this.  -Continue diazepam (though was started for possible stiff person syndrome) as this seems to be helping w muscle stiffness as well as anxiety.  -Gabapentin as above.  -Continue PT/OT.     Transaminase elevation, fluctuating  Unclear etiology, possibly related to cefazolin or fluconazole.  Previous transaminase elevation thought to be 2/2 TPN or nirogacestat as LFT rise preceded starting fluconazole.  Liver U/S w Doppler 8/15 unremarkable.  -Continue to monitor daily.     Oral candidiasis.  Resolved.     Fe def anemia.  S/P 1.02g IV Fe (ferumoxytol).  H/O Fe hypersensitivity reactions and evaluated by [facility].     SIBO.  Typically on cefdinir MWF, doxy TTSS at home.     Coffee-ground emesis earlier this stay likely 2/2 NGT.  -Continue PPI BID for now.     Daily checklist:  VTE prophylaxis: Discuss with patient tomorrow, has been on hold in setting of GI bleeding  Diet: Supplement Adult; Ensure Plus High Protein (High Calorie/High Protein); # of Products PER Serving: 1 2xd PC  Osmolite 1.5 (Standard 1.5 Cal) continuous tube feed  Nutrition Therapy Regular/House  Code status: Full Code  HCDM:   HCDM ([place] policy, no known patient preference): Depace,[name] - Mother - [phone]    HCDM, back-up (If primary HCDM is unavailable): [name] - Father - [phone]    Disposition: Likely AIR once ready. See checklist from PM&R.   [ ]  CBC/BMP & other pertinent labs within 48 hours of AIR   [ ]  Prefer oral pain meds only, but if on IV pain meds, then no more than q 6-8 hours prn   [ ]  6 minute walk test completed for home oxygen requirement   [ ]  If on oxygen, no more than 4 LPM [place]   [ ]  Chemotherapy plan documented   [ ]  Currently needing TF via NGT. May need to discharge to home with NGT. Feeding plan ongoing   [ ]  Documentation of therapy tolerance     I personally spent 50 minutes face-to-face and non-face-to-face in the care of this patient, which includes all pre, intra, and post visit time on the date of service.  All documented time was specific to the E/M visit and does not include any procedures that may have been performed.      ___________________________________________________________________    Subjective:  No significant events overnight. Patient feeling dehydrated and feels that urine is dark. Mother notes it is cloudy. Patient denies dysuria or urinary changes but does overall feel dehydrated. She reports she gets periodic fluids as an outpatient and has been getting fluids earlier in the admission. Walked further yesterday than she had in the last couple of weeks, which she  is happy about. Starting to tolerate larger amounts of food at bedtime.     Recent Results (from the past 24 hour(s))   Comprehensive Metabolic Panel    Collection Time: 11/08/22  5:13 AM   Result Value Ref Range    Sodium 141 135 - 145 mmol/L    Potassium 3.8 3.4 - 4.8 mmol/L    Chloride 104 98 - 107 mmol/L    CO2 32.0 (H) 20.0 - 31.0 mmol/L    Anion Gap 5 5 - 14 mmol/L    BUN 9 9 - 23 mg/dL    Creatinine 5.17 (L) 0.55 - 1.02 mg/dL    BUN/Creatinine Ratio 20     eGFR CKD-EPI (2021) Female >90 >=60 mL/min/1.59m2    Glucose 112 70 - 179 mg/dL    Calcium 9.5 8.7 - 61.6 mg/dL    Albumin 2.9 (L) 3.4 - 5.0 g/dL    Total Protein 5.9 5.7 - 8.2 g/dL    Total Bilirubin 0.2 (L) 0.3 - 1.2 mg/dL    AST 88 (H) <=07 U/L    ALT 90 (H) 10 - 49 U/L    Alkaline Phosphatase 185 (H) 46 - 116 U/L   CBC w/ Differential    Collection Time: 11/08/22 10:39 AM   Result Value Ref Range    WBC 5.1 3.6 - 11.2 10*9/L    RBC 3.50 (L) 3.95 - 5.13 10*12/L    HGB 9.6 (L) 11.3 - 14.9 g/dL    HCT 37.1 (L) 06.2 - 44.0 %    MCV 82.8 77.6 - 95.7 fL    MCH 27.5 25.9 - 32.4 pg    MCHC 33.2 32.0 - 36.0 g/dL    RDW 69.4 (H) 85.4 - 15.2 %    MPV 10.6 6.8 - 10.7 fL    Platelet 166 150 - 450 10*9/L    Neutrophils % 45.1 %    Lymphocytes % 36.3 %    Monocytes % 14.7 %    Eosinophils % 3.3 %    Basophils % 0.6 %    Absolute Neutrophils 2.3 1.8 - 7.8 10*9/L    Absolute Lymphocytes 1.8 1.1 - 3.6 10*9/L    Absolute Monocytes 0.7 0.3 - 0.8 10*9/L    Absolute Eosinophils 0.2 0.0 - 0.5 10*9/L    Absolute Basophils 0.0 0.0 - 0.1 10*9/L    Anisocytosis Slight (A) Not Present     Labs/Studies:  Labs and Studies from the last 24hrs per EMR and Reviewed    Objective:  Temp:  [36.4 ??C (97.5 ??F)-36.7 ??C (98.1 ??F)] 36.5 ??C (97.7 ??F)  Heart Rate:  [67-91] 71  Resp:  [17-18] 18  BP: (103-121)/(58-71) 105/63  SpO2:  [100 %] 100 %    General: No acute distress.   HEENT: MMM, oropharynx clear.  Corpak in place  Cardiac: RRR, no M/R/G  Pulmonary: Normal work of breathing, clear bilaterally, no wheezes or crackles  Abdomen: Soft, Not particularly distended or scar tissue present.  Tender to palpation but overall stable  Extremities: No edema, cyanosis, or clubbing. Warm  Neuro: CN 2-12 intact, no gross motor deficits  Psych: Oriented to self, place, and time. Normal speech and affect.  Central line in the neck

## 2022-11-09 NOTE — Unmapped (Signed)
Patient alert and oriented x 4 and 1-2 assist to [facility].  Medications administered per order.  TF running at 96ml/hr, patient unable to tolerate goal rate due to nausea.  VSS.  Family at bedside.   Problem: Adult Inpatient Plan of Care  Goal: Optimal Comfort and Wellbeing  Outcome: Ongoing - Unchanged     Problem: Nausea and Vomiting  Goal: Nausea and Vomiting Relief  Outcome: Ongoing - Unchanged     Problem: Infection  Goal: Absence of Infection Signs and Symptoms  Outcome: Ongoing - Unchanged     Problem: Pain Acute  Goal: Optimal Pain Control and Function  Outcome: Ongoing - Unchanged     Problem: Malnutrition  Goal: Improved Nutritional Intake  Outcome: Ongoing - Unchanged

## 2022-11-09 NOTE — Unmapped (Signed)
Hospital Medicine Daily Progress Note    Assessment/Plan:    Principal Problem:    Other acute postprocedural pain  Active Problems:    [name]    Intestinal polyps    [name]    Neoplasm related pain    Physical deconditioning    History of colectomy    Intractable abdominal pain    Generalized anxiety disorder with panic attacks    SBO (small bowel obstruction) (CMS-HCC)    Small intestinal bacterial overgrowth (SIBO)   Malnutrition Evaluation as performed by RD, LDN: Patient does not meet AND/ASPEN criteria for malnutrition at this time (09/29/22 1439)             [name] [name] is a 22 y.o. female who presented to [facility] with Other acute postprocedural pain.    Enterobacteriaceae group bacteremia, C albicans fungemia  Likely due to RIJ tunneled line placed 01/2022 now removed.  8/13, 8/17 repeat blood cultures NGTD.  No evidence of vitreitis, retinitis 8/15 per Ophtho.  TTE 8/15 w possible vegetations on MV and TV or indwelling catheter; however, TEE 8/20 w/o vegetations.  (Cefepime 8/13-8/20, metronidazole 8/13-8/16, micafungin 8/14-8/22)  - Continue cefazolin (started 8/20), fluconazole (IV given difficulty tolerating PO form) through 8/31, even w slightly elevated LFTs.  - Check CBC w diff weekly, CMP daily while on above.     [name], desmoid fibromatosis.  H/O proctocolectomy, IPAA  Follows w Dr. Theone Stanley [facility]).  S/P planned triamcinolone/bupivacaine injection of 2 RUE desmoid tumors, L chest tumor by IR 7/18, complicated by post-op pain which was initial reason for this stay.  Have intermittently held home nirogacestat for various reasons this stay.  - Per Dr. [name] 8/26, can resume nirogacestat at 50mg  BID when medically feasible.  Can then titrate up to 100mg  BID after 1 week and then to goal 150mg  BID after 1 week per his rec (even though starting at goal dose of 150mg  BID is standard).  Holding off on restarting given elevated LFTs.     Nausea  Improving but not resolved.  Ileus resolved, needed NG decompression earlier this stay w TPN 7/21-8/13.  Baseline necessary opioid use.  H/O TPN dependence.  - Continue ND tube feeds (Osmolite 1.5 72ml/h, free water flush 50ml q3h) though probably needs closer to H2O q3h instead of 50ml q3h but patient trying to prioritize PO intake.   - Continue bisacodyl, PEG, senna PRN for now as is having stools spontaneously.  - Continue naloxegol.  - Allowing IV promethazine PRN.  - 1L fluid bolus today     Generalized anxiety disorder, depression, h/o panic attacks.  - Continue home mirtazapine.  - Since using diazepam (see below), will use this to treat anxiety as well after discussion w Psych 8/27.    - Continue diazepam 2.5/2.5/5 mg with extra 2.5 mg PRN at night   - If needed could give 2.5mg  PO diazepam for acute anxiety.  - Appreciate Psych???s assistance.     Chronic abd pain 2/2 intra-abd desmoid fibromatosis with superimposed low back pain  Follows w [facility].  Typically on oxycodone 10mg  PRN at home.  S/P lidocaine 7/21-7/24, ketamine 8/1-8/9. Patient with 2-3 days of central low back pain and R hip pain after specific inciting event. May consider imaging if ongoing pain in this area despite conservative management.   -Continue home buprenorphine-naloxone 0.5-0.125 TID.  -Continue oxycodone 10-15mg  q4h PRN for now.  -Space dilaudid to q4h PRN  -Trial lidocaine patches to  the low back/hip  -Continue gabapentin.     Muscle spasm, stiffness, deconditioning  MRI C, T, L-spine and MRI brain all w and w/o contrast unremarkable.  7/20 paraneoplastic Ab panel w slightly elevated P/Q-type Ca channel Ab which can be seen w [facility].  However, EMG NL.  Autoimmune myelopathy panel neg, including neg for anti-GAD65 Ab's making stiff person syndrom unlikely.  -Continue baclofen for now but might consider starting to wean this.  -Continue diazepam (though was started for possible stiff person syndrome) as this seems to be helping w muscle stiffness as well as anxiety.  -Gabapentin as above.  -Continue PT/OT.     Transaminase elevation, fluctuating  Unclear etiology, possibly related to cefazolin or fluconazole.  Previous transaminase elevation thought to be 2/2 TPN or nirogacestat as LFT rise preceded starting fluconazole.  Liver U/S w Doppler 8/15 unremarkable.  -Continue to monitor daily.     Oral candidiasis.  Resolved.     Fe def anemia.  S/P 1.02g IV Fe (ferumoxytol).  H/O Fe hypersensitivity reactions and evaluated by [facility].     SIBO.  Typically on cefdinir MWF, doxy TTSS at home.     Coffee-ground emesis earlier this stay likely 2/2 NGT.  -Continue PPI BID for now.     Daily checklist:  VTE prophylaxis: Discuss with patient tomorrow, has been on hold in setting of GI bleeding  Diet: Supplement Adult; Ensure Plus High Protein (High Calorie/High Protein); # of Products PER Serving: 1 2xd PC  Osmolite 1.5 (Standard 1.5 Cal) continuous tube feed  Nutrition Therapy Regular/House  Code status: Full Code  HCDM:   HCDM ([place] policy, no known patient preference): [name],[name] - Mother - [phone]    HCDM, back-up (If primary HCDM is unavailable): [name] [name] - Father - [phone]    Disposition: Likely AIR once ready. See checklist from PM&R.   [ ]  CBC/BMP & other pertinent labs within 48 hours of AIR   [ ]  Prefer oral pain meds only, but if on IV pain meds, then no more than q 6-8 hours prn   [ ]  6 minute walk test completed for home oxygen requirement   [ ]  If on oxygen, no more than 4 LPM [place]   [ ]  Chemotherapy plan documented   [ ]  Currently needing TF via NGT. May need to discharge to home with NGT. Feeding plan ongoing   [ ]  Documentation of therapy tolerance     I personally spent 50 minutes face-to-face and non-face-to-face in the care of this patient, which includes all pre, intra, and post visit time on the date of service.  All documented time was specific to the E/M visit and does not include any procedures that may have been performed.      ___________________________________________________________________    Subjective:  No significant events overnight. Patient feels that low back pain and hip pain still extremely bothersome. Eating has picked up the slightest bit, having 15 bites with meals rather than 5.     Recent Results (from the past 24 hour(s))   Comprehensive Metabolic Panel    Collection Time: 11/09/22  6:04 AM   Result Value Ref Range    Sodium 143 135 - 145 mmol/L    Potassium 3.7 3.4 - 4.8 mmol/L    Chloride 106 98 - 107 mmol/L    CO2 31.0 20.0 - 31.0 mmol/L    Anion Gap 6 5 - 14 mmol/L    BUN 9 9 - 23 mg/dL  Creatinine 0.43 (L) 0.55 - 1.02 mg/dL    BUN/Creatinine Ratio 21     eGFR CKD-EPI (2021) Female >90 >=60 mL/min/1.76m2    Glucose 109 70 - 179 mg/dL    Calcium 9.8 8.7 - 44.0 mg/dL    Albumin 3.1 (L) 3.4 - 5.0 g/dL    Total Protein 5.9 5.7 - 8.2 g/dL    Total Bilirubin 0.2 (L) 0.3 - 1.2 mg/dL    AST 53 (H) <=34 U/L    ALT 62 (H) 10 - 49 U/L    Alkaline Phosphatase 165 (H) 46 - 116 U/L     Labs/Studies:  Labs and Studies from the last 24hrs per EMR and Reviewed    Objective:  Temp:  [36.3 ??C (97.3 ??F)-36.8 ??C (98.2 ??F)] 36.5 ??C (97.7 ??F)  Heart Rate:  [65-99] 99  Resp:  [18] 18  BP: (98-117)/(45-67) 110/63  SpO2:  [97 %-100 %] 100 %    General: No acute distress.   HEENT: MMM, oropharynx clear.  Corpak in place  Cardiac: RRR, no M/R/G  Pulmonary: Normal work of breathing, clear bilaterally, no wheezes or crackles  Abdomen: Soft, Not particularly distended or scar tissue present.  Deferred exam of her back since she was in the chair and eating lunch.   Extremities: No edema, cyanosis, or clubbing. Warm  Neuro: CN 2-12 intact, no gross motor deficits  Psych: Oriented to self, place, and time. Normal speech and affect.  Central line in the neck

## 2022-11-10 LAB — COMPREHENSIVE METABOLIC PANEL
ALBUMIN: 3.1 g/dL — ABNORMAL LOW (ref 3.4–5.0)
ALKALINE PHOSPHATASE: 150 U/L — ABNORMAL HIGH (ref 46–116)
ALT (SGPT): 45 U/L (ref 10–49)
ANION GAP: 4 mmol/L — ABNORMAL LOW (ref 5–14)
AST (SGOT): 35 U/L — ABNORMAL HIGH (ref <=34)
BILIRUBIN TOTAL: 0.2 mg/dL — ABNORMAL LOW (ref 0.3–1.2)
BLOOD UREA NITROGEN: 8 mg/dL — ABNORMAL LOW (ref 9–23)
BUN / CREAT RATIO: 17
CALCIUM: 9.5 mg/dL (ref 8.7–10.4)
CHLORIDE: 105 mmol/L (ref 98–107)
CO2: 32 mmol/L — ABNORMAL HIGH (ref 20.0–31.0)
CREATININE: 0.46 mg/dL — ABNORMAL LOW
EGFR CKD-EPI (2021) FEMALE: >90 mL/min/{1.73_m2} (ref >=60)
GLUCOSE RANDOM: 111 mg/dL (ref 70–179)
POTASSIUM: 3.8 mmol/L (ref 3.4–4.8)
PROTEIN TOTAL: 5.9 g/dL (ref 5.7–8.2)
SODIUM: 141 mmol/L (ref 135–145)

## 2022-11-10 MED ADMIN — promethazine (PHENERGAN) 6.5 mg in sodium chloride (NS) 0.9 % 50 mL IVPB: 6.5 mg | INTRAVENOUS | @ 13:00:00

## 2022-11-10 MED ADMIN — pantoprazole (Protonix) EC tablet 40 mg: 40 mg | ORAL | @ 14:00:00

## 2022-11-10 MED ADMIN — carboxymethylcellulose sodium (REFRESH CELLUVISC) 1 % ophthalmic gel 1 drop: 1 [drp] | OPHTHALMIC | @ 10:00:00

## 2022-11-10 MED ADMIN — cetirizine (ZYRTEC) tablet 10 mg: 10 mg | GASTROENTERAL | @ 14:00:00

## 2022-11-10 MED ADMIN — carboxymethylcellulose sodium (REFRESH CELLUVISC) 1 % ophthalmic gel 1 drop: 1 [drp] | OPHTHALMIC | @ 02:00:00

## 2022-11-10 MED ADMIN — buprenorphine-naloxone (SUBOXONE) 2-0.5 mg SL film 0.5 mg of buprenorphine: .25 | SUBLINGUAL | @ 19:00:00

## 2022-11-10 MED ADMIN — gabapentin (NEURONTIN) capsule 100 mg: 100 mg | ORAL | @ 14:00:00

## 2022-11-10 MED ADMIN — naloxegol (MOVANTIK) 12.5 mg tablet 12.5 mg: 12.5 mg | ORAL | @ 14:00:00

## 2022-11-10 MED ADMIN — HYDROmorphone (PF) (DILAUDID) injection 0.5 mg: .5 mg | INTRAVENOUS | @ 10:00:00 | Stop: 2022-11-12

## 2022-11-10 MED ADMIN — buprenorphine-naloxone (SUBOXONE) 2-0.5 mg SL film 0.5 mg of buprenorphine: .25 | SUBLINGUAL | @ 02:00:00

## 2022-11-10 MED ADMIN — baclofen (LIORESAL) tablet 20 mg: 20 mg | ORAL | @ 18:00:00

## 2022-11-10 MED ADMIN — gabapentin (NEURONTIN) capsule 100 mg: 100 mg | ORAL | @ 02:00:00

## 2022-11-10 MED ADMIN — baclofen (LIORESAL) tablet 20 mg: 20 mg | ORAL | @ 02:00:00

## 2022-11-10 MED ADMIN — diclofenac sodium (VOLTAREN) 1 % gel 2 g: 2 g | TOPICAL | @ 02:00:00

## 2022-11-10 MED ADMIN — lidocaine (ASPERCREME) 4 % 2 patch: 2 | TRANSDERMAL | @ 14:00:00

## 2022-11-10 MED ADMIN — oxyCODONE (ROXICODONE) immediate release tablet 15 mg: 15 mg | ORAL | @ 20:00:00 | Stop: 2022-11-14

## 2022-11-10 MED ADMIN — oxyCODONE (ROXICODONE) immediate release tablet 15 mg: 15 mg | ORAL | @ 03:00:00 | Stop: 2022-11-14

## 2022-11-10 MED ADMIN — pantoprazole (Protonix) EC tablet 40 mg: 40 mg | ORAL | @ 02:00:00

## 2022-11-10 MED ADMIN — baclofen (LIORESAL) tablet 20 mg: 20 mg | ORAL | @ 14:00:00

## 2022-11-10 MED ADMIN — gabapentin (NEURONTIN) capsule 100 mg: 100 mg | ORAL | @ 18:00:00

## 2022-11-10 MED ADMIN — promethazine (PHENERGAN) 6.5 mg in sodium chloride (NS) 0.9 % 50 mL IVPB: 6.5 mg | INTRAVENOUS | @ 05:00:00

## 2022-11-10 MED ADMIN — diazePAM (VALIUM) tablet 2.5 mg: 2.5 mg | ORAL | @ 19:00:00

## 2022-11-10 MED ADMIN — HYDROmorphone (PF) (DILAUDID) injection 0.5 mg: .5 mg | INTRAVENOUS | @ 14:00:00 | Stop: 2022-11-12

## 2022-11-10 MED ADMIN — diazePAM (VALIUM) tablet 2.5 mg: 2.5 mg | ORAL | @ 14:00:00

## 2022-11-10 MED ADMIN — buprenorphine-naloxone (SUBOXONE) 2-0.5 mg SL film 0.5 mg of buprenorphine: .25 | SUBLINGUAL | @ 14:00:00

## 2022-11-10 MED ADMIN — famotidine (PEPCID) tablet 20 mg: 20 mg | ORAL | @ 02:00:00

## 2022-11-10 MED ADMIN — diazePAM (VALIUM) tablet 5 mg: 5 mg | ORAL | @ 02:00:00

## 2022-11-10 MED ADMIN — HYDROmorphone (PF) (DILAUDID) injection 0.5 mg: .5 mg | INTRAVENOUS | @ 05:00:00 | Stop: 2022-11-12

## 2022-11-10 MED ADMIN — HYDROmorphone (PF) (DILAUDID) injection 0.5 mg: .5 mg | INTRAVENOUS | @ 18:00:00 | Stop: 2022-11-12

## 2022-11-10 MED ADMIN — carboxymethylcellulose sodium (REFRESH CELLUVISC) 1 % ophthalmic gel 1 drop: 1 [drp] | OPHTHALMIC | @ 18:00:00

## 2022-11-10 MED ADMIN — multivitamin with folic acid 400 mcg tablet 1 tablet: 1 | ORAL | @ 14:00:00

## 2022-11-10 NOTE — Unmapped (Signed)
Alert and oriented,vitals stable.Received prn pain/nausea meds.last night with good effect.TF remains @ 40 ml/hr.tolerating,no vomiting noted.Mom at bedside and very supportive,assisting with adls.Will cont.POC.    Problem: Adult Inpatient Plan of Care  Goal: Plan of Care Review  Outcome: Progressing  Goal: Patient-Specific Goal (Individualized)  Outcome: Progressing  Flowsheets (Taken 11/10/2022 0358)  Patient/Family-Specific Goals (Include Timeframe): Pt.will report adequate pain and nausea control this shift.  Individualized Care Needs: Monitor vitals/labs.,falls prec.,pain mgt,prn meds.,tube feeding via Corpak tube  Anxieties, Fears or Concerns: Pain/nausea control  Goal: Absence of Hospital-Acquired Illness or Injury  Outcome: Progressing  Intervention: Identify and Manage Fall Risk  Recent Flowsheet Documentation  Taken 11/09/2022 2000 by Debara Pickett, RN  Safety Interventions:   family at bedside   fall reduction program maintained   environmental modification   lighting adjusted for tasks/safety   enteral feeding safety   nonskid shoes/slippers when out of bed  Intervention: Prevent and Manage VTE (Venous Thromboembolism) Risk  Recent Flowsheet Documentation  Taken 11/10/2022 0000 by Debara Pickett, RN  Anti-Embolism Device Type: SCD, Knee  Anti-Embolism Intervention: On  Anti-Embolism Device Location: BLE  Goal: Optimal Comfort and Wellbeing  Outcome: Progressing  Goal: Readiness for Transition of Care  Outcome: Progressing  Goal: Rounds/Family Conference  Outcome: Progressing     Problem: Latex Allergy  Goal: Absence of Allergy Symptoms  Outcome: Progressing     Problem: Fall Injury Risk  Goal: Absence of Fall and Fall-Related Injury  Outcome: Progressing  Intervention: Promote Injury-Free Environment  Recent Flowsheet Documentation  Taken 11/09/2022 2000 by Debara Pickett, RN  Safety Interventions:   family at bedside   fall reduction program maintained   environmental modification   lighting adjusted for tasks/safety   enteral feeding safety   nonskid shoes/slippers when out of bed     Problem: Skin Injury Risk Increased  Goal: Skin Health and Integrity  Outcome: Progressing     Problem: Self-Care Deficit  Goal: Improved Ability to Complete Activities of Daily Living  Outcome: Progressing     Problem: Comorbidity Management  Goal: Blood Glucose Levels Within Targeted Range  Outcome: Progressing  Goal: Blood Pressure in Desired Range  Outcome: Progressing     Problem: Nausea and Vomiting  Goal: Nausea and Vomiting Relief  Outcome: Progressing     Problem: Infection  Goal: Absence of Infection Signs and Symptoms  Outcome: Progressing     Problem: Pain Acute  Goal: Optimal Pain Control and Function  Outcome: Progressing     Problem: Malnutrition  Goal: Improved Nutritional Intake  Outcome: Progressing

## 2022-11-10 NOTE — Unmapped (Signed)
Still requiring pain and nausea medications, tolerating tube feedings but not to goal yet.  Problem: Adult Inpatient Plan of Care  Goal: Plan of Care Review  Outcome: Ongoing - Unchanged  Goal: Patient-Specific Goal (Individualized)  Outcome: Ongoing - Unchanged  Goal: Absence of Hospital-Acquired Illness or Injury  Outcome: Ongoing - Unchanged  Goal: Optimal Comfort and Wellbeing  Outcome: Ongoing - Unchanged  Goal: Readiness for Transition of Care  Outcome: Ongoing - Unchanged  Goal: Rounds/Family Conference  Outcome: Ongoing - Unchanged     Problem: Latex Allergy  Goal: Absence of Allergy Symptoms  Outcome: Ongoing - Unchanged     Problem: Fall Injury Risk  Goal: Absence of Fall and Fall-Related Injury  Outcome: Ongoing - Unchanged     Problem: Skin Injury Risk Increased  Goal: Skin Health and Integrity  Outcome: Ongoing - Unchanged     Problem: Self-Care Deficit  Goal: Improved Ability to Complete Activities of Daily Living  Outcome: Ongoing - Unchanged     Problem: Comorbidity Management  Goal: Blood Glucose Levels Within Targeted Range  Outcome: Ongoing - Unchanged  Goal: Blood Pressure in Desired Range  Outcome: Ongoing - Unchanged     Problem: Nausea and Vomiting  Goal: Nausea and Vomiting Relief  Outcome: Ongoing - Unchanged     Problem: Infection  Goal: Absence of Infection Signs and Symptoms  Outcome: Ongoing - Unchanged     Problem: Pain Acute  Goal: Optimal Pain Control and Function  Outcome: Ongoing - Unchanged     Problem: Malnutrition  Goal: Improved Nutritional Intake  Outcome: Ongoing - Unchanged

## 2022-11-11 LAB — COMPREHENSIVE METABOLIC PANEL
ALBUMIN: 3.1 g/dL — ABNORMAL LOW (ref 3.4–5.0)
ALKALINE PHOSPHATASE: 154 U/L — ABNORMAL HIGH (ref 46–116)
ALT (SGPT): 50 U/L — ABNORMAL HIGH (ref 10–49)
ANION GAP: 4 mmol/L — ABNORMAL LOW (ref 5–14)
AST (SGOT): 59 U/L — ABNORMAL HIGH (ref <=34)
BILIRUBIN TOTAL: 0.2 mg/dL — ABNORMAL LOW (ref 0.3–1.2)
BLOOD UREA NITROGEN: 7 mg/dL — ABNORMAL LOW (ref 9–23)
BUN / CREAT RATIO: 17
CALCIUM: 9.5 mg/dL (ref 8.7–10.4)
CHLORIDE: 105 mmol/L (ref 98–107)
CO2: 33 mmol/L — ABNORMAL HIGH (ref 20.0–31.0)
CREATININE: 0.42 mg/dL — ABNORMAL LOW
EGFR CKD-EPI (2021) FEMALE: >90 mL/min/{1.73_m2} (ref >=60)
GLUCOSE RANDOM: 100 mg/dL (ref 70–179)
POTASSIUM: 3.9 mmol/L (ref 3.4–4.8)
PROTEIN TOTAL: 6 g/dL (ref 5.7–8.2)
SODIUM: 142 mmol/L (ref 135–145)

## 2022-11-11 LAB — CBC
HEMATOCRIT: 29 % — ABNORMAL LOW (ref 34.0–44.0)
HEMOGLOBIN: 9.8 g/dL — ABNORMAL LOW (ref 11.3–14.9)
MEAN CORPUSCULAR HEMOGLOBIN CONC: 33.7 g/dL (ref 32.0–36.0)
MEAN CORPUSCULAR HEMOGLOBIN: 28 pg (ref 25.9–32.4)
MEAN CORPUSCULAR VOLUME: 82.9 fL (ref 77.6–95.7)
MEAN PLATELET VOLUME: 10.3 fL (ref 6.8–10.7)
PLATELET COUNT: 155 10*9/L (ref 150–450)
RED BLOOD CELL COUNT: 3.49 10*12/L — ABNORMAL LOW (ref 3.95–5.13)
RED CELL DISTRIBUTION WIDTH: 17.1 % — ABNORMAL HIGH (ref 12.2–15.2)
WBC ADJUSTED: 5.2 10*9/L (ref 3.6–11.2)

## 2022-11-11 MED ADMIN — gabapentin (NEURONTIN) capsule 100 mg: 100 mg | ORAL | @ 19:00:00

## 2022-11-11 MED ADMIN — ondansetron (ZOFRAN) injection 4 mg: 4 mg | INTRAVENOUS | @ 11:00:00

## 2022-11-11 MED ADMIN — oxyCODONE (ROXICODONE) immediate release tablet 15 mg: 15 mg | ORAL | @ 04:00:00 | Stop: 2022-11-14

## 2022-11-11 MED ADMIN — HYDROmorphone (PF) (DILAUDID) injection 0.5 mg: .5 mg | INTRAVENOUS | @ 16:00:00 | Stop: 2022-11-14

## 2022-11-11 MED ADMIN — diazePAM (VALIUM) tablet 2.5 mg: 2.5 mg | ORAL | @ 19:00:00

## 2022-11-11 MED ADMIN — HYDROmorphone (PF) (DILAUDID) injection 0.5 mg: .5 mg | INTRAVENOUS | @ 01:00:00 | Stop: 2022-11-12

## 2022-11-11 MED ADMIN — buprenorphine-naloxone (SUBOXONE) 2-0.5 mg SL film 0.5 mg of buprenorphine: .25 | SUBLINGUAL | @ 01:00:00

## 2022-11-11 MED ADMIN — HYDROmorphone (PF) (DILAUDID) injection 0.5 mg: .5 mg | INTRAVENOUS | @ 11:00:00 | Stop: 2022-11-14

## 2022-11-11 MED ADMIN — famotidine (PEPCID) tablet 20 mg: 20 mg | ORAL | @ 01:00:00

## 2022-11-11 MED ADMIN — buprenorphine-naloxone (SUBOXONE) 2-0.5 mg SL film 0.5 mg of buprenorphine: .25 | SUBLINGUAL | @ 19:00:00

## 2022-11-11 MED ADMIN — gabapentin (NEURONTIN) capsule 100 mg: 100 mg | ORAL | @ 01:00:00

## 2022-11-11 MED ADMIN — naloxegol (MOVANTIK) 12.5 mg tablet 12.5 mg: 12.5 mg | ORAL | @ 14:00:00

## 2022-11-11 MED ADMIN — carboxymethylcellulose sodium (REFRESH CELLUVISC) 1 % ophthalmic gel 1 drop: 1 [drp] | OPHTHALMIC | @ 01:00:00

## 2022-11-11 MED ADMIN — baclofen (LIORESAL) tablet 20 mg: 20 mg | ORAL | @ 14:00:00 | Stop: 2022-11-11

## 2022-11-11 MED ADMIN — multivitamin with folic acid 400 mcg tablet 1 tablet: 1 | ORAL | @ 14:00:00

## 2022-11-11 MED ADMIN — HYDROmorphone (PF) (DILAUDID) injection 0.5 mg: .5 mg | INTRAVENOUS | @ 06:00:00 | Stop: 2022-11-14

## 2022-11-11 MED ADMIN — baclofen (LIORESAL) tablet 20 mg: 20 mg | ORAL | @ 01:00:00

## 2022-11-11 MED ADMIN — oxyCODONE (ROXICODONE) immediate release tablet 15 mg: 15 mg | ORAL | @ 14:00:00 | Stop: 2022-11-14

## 2022-11-11 MED ADMIN — HYDROmorphone (PF) (DILAUDID) injection 0.5 mg: .5 mg | INTRAVENOUS | @ 22:00:00 | Stop: 2022-11-14

## 2022-11-11 MED ADMIN — diclofenac sodium (VOLTAREN) 1 % gel 2 g: 2 g | TOPICAL | @ 01:00:00

## 2022-11-11 MED ADMIN — pantoprazole (Protonix) EC tablet 40 mg: 40 mg | ORAL | @ 01:00:00

## 2022-11-11 MED ADMIN — mirtazapine (REMERON) tablet 15 mg: 15 mg | GASTROENTERAL | @ 01:00:00

## 2022-11-11 MED ADMIN — pantoprazole (Protonix) EC tablet 40 mg: 40 mg | ORAL | @ 14:00:00

## 2022-11-11 MED ADMIN — carboxymethylcellulose sodium (REFRESH CELLUVISC) 1 % ophthalmic gel 1 drop: 1 [drp] | OPHTHALMIC | @ 10:00:00

## 2022-11-11 MED ADMIN — carboxymethylcellulose sodium (REFRESH CELLUVISC) 1 % ophthalmic gel 1 drop: 1 [drp] | OPHTHALMIC | @ 16:00:00

## 2022-11-11 MED ADMIN — diazePAM (VALIUM) tablet 5 mg: 5 mg | ORAL | @ 01:00:00

## 2022-11-11 MED ADMIN — ondansetron (ZOFRAN) injection 4 mg: 4 mg | INTRAVENOUS | @ 02:00:00

## 2022-11-11 MED ADMIN — promethazine (PHENERGAN) 6.5 mg in sodium chloride (NS) 0.9 % 50 mL IVPB: 6.5 mg | INTRAVENOUS | @ 14:00:00

## 2022-11-11 MED ADMIN — gabapentin (NEURONTIN) capsule 100 mg: 100 mg | ORAL | @ 14:00:00

## 2022-11-11 MED ADMIN — diclofenac sodium (VOLTAREN) 1 % gel 2 g: 2 g | TOPICAL | @ 16:00:00

## 2022-11-11 MED ADMIN — diclofenac sodium (VOLTAREN) 1 % gel 2 g: 2 g | TOPICAL | @ 10:00:00

## 2022-11-11 MED ADMIN — buprenorphine-naloxone (SUBOXONE) 2-0.5 mg SL film 0.5 mg of buprenorphine: .25 | SUBLINGUAL | @ 14:00:00

## 2022-11-11 MED ADMIN — polyethylene glycol (MIRALAX) packet 17 g: 17 g | ORAL | @ 02:00:00

## 2022-11-11 MED ADMIN — lidocaine (ASPERCREME) 4 % 2 patch: 2 | TRANSDERMAL | @ 14:00:00

## 2022-11-11 MED ADMIN — baclofen (LIORESAL) tablet 10 mg: 10 mg | ORAL | @ 19:00:00

## 2022-11-11 MED ADMIN — diazePAM (VALIUM) tablet 2.5 mg: 2.5 mg | ORAL | @ 14:00:00

## 2022-11-11 MED ADMIN — cetirizine (ZYRTEC) tablet 10 mg: 10 mg | GASTROENTERAL | @ 14:00:00

## 2022-11-11 MED ADMIN — iohexol (OMNIPAQUE) 350 mg iodine/mL solution 100 mL: 100 mL | INTRAVENOUS | @ 14:00:00 | Stop: 2022-11-11

## 2022-11-11 MED ADMIN — diclofenac sodium (VOLTAREN) 1 % gel 2 g: 2 g | TOPICAL | @ 22:00:00

## 2022-11-11 MED ADMIN — carboxymethylcellulose sodium (REFRESH CELLUVISC) 1 % ophthalmic gel 1 drop: 1 [drp] | OPHTHALMIC | @ 22:00:00

## 2022-11-11 MED ADMIN — oxyCODONE (ROXICODONE) immediate release tablet 15 mg: 15 mg | ORAL | @ 19:00:00 | Stop: 2022-11-14

## 2022-11-11 MED ADMIN — promethazine (PHENERGAN) 6.5 mg in sodium chloride (NS) 0.9 % 50 mL IVPB: 6.5 mg | INTRAVENOUS | @ 23:00:00

## 2022-11-11 MED ADMIN — promethazine (PHENERGAN) 6.5 mg in sodium chloride (NS) 0.9 % 50 mL IVPB: 6.5 mg | INTRAVENOUS | @ 06:00:00

## 2022-11-11 NOTE — Unmapped (Signed)
Hospital Medicine Daily Progress Note    Assessment/Plan:    Principal Problem:    Other acute postprocedural pain  Active Problems:    [name]    Intestinal polyps    [name]    Neoplasm related pain    Physical deconditioning    History of colectomy    Intractable abdominal pain    Generalized anxiety disorder with panic attacks    SBO (small bowel obstruction) (CMS-HCC)    Small intestinal bacterial overgrowth (SIBO)   Malnutrition Evaluation as performed by RD, LDN: Patient does not meet AND/ASPEN criteria for malnutrition at this time (09/29/22 1439)             [name] [name] is a 22 y.o. female who presented to [name] with Other acute postprocedural pain.    Chronic abd pain 2/2 intra-abd desmoid fibromatosis with superimposed low back pain  Follows w [facility].  Typically on oxycodone 10mg  PRN at home.  S/P lidocaine 7/21-7/24, ketamine 8/1-8/9. Patient with ~5 days of central low back pain and R hip pain after specific inciting event; highest suspicion for MSK injury but in setting of recent infections, might be at higher risk for metastatic infection (timing not totally consistent though).   -CT L-spine, pelvis, hips ordered  -Continue home buprenorphine-naloxone 0.5-0.125 TID. Consider discussion with pain team tomorrow about role for adjusting suboxone.   -Continue oxycodone 10-15mg  q4h PRN for now.  -Continue dilaudid to q4h PRN  -Trial lidocaine patches to the low back/hip  -Continue gabapentin.    [name], desmoid fibromatosis.  H/O proctocolectomy, IPAA  Follows w Dr. Theone [name]).  S/P planned triamcinolone/bupivacaine injection of 2 RUE desmoid tumors, L chest tumor by IR 7/18, complicated by post-op pain which was initial reason for this stay.  Have intermittently held home nirogacestat for various reasons this stay.  - Per Dr. Meredith [name] 8/26, can resume nirogacestat at 50mg  BID when medically feasible.  Can then titrate up to 100mg  BID after 1 week and then to goal 150mg  BID after 1 week per his rec (even though starting at goal dose of 150mg  BID is standard).    - If LFTs normalized tomorrow, consider restarting the nirogacestat     Muscle spasm, stiffness, deconditioning  MRI C, T, L-spine and MRI brain all w and w/o contrast unremarkable.  7/20 paraneoplastic Ab panel w slightly elevated P/Q-type Ca channel Ab which can be seen w [name] [name].  However, EMG NL.  Autoimmune myelopathy panel neg, including neg for anti-GAD65 Ab's making stiff person syndrom unlikely.  -Continue baclofen for now but might consider starting to wean this.  -Continue diazepam (though was started for possible stiff person syndrome) as this seems to be helping w muscle stiffness as well as anxiety.  -Gabapentin as above.  -Continue PT/OT.    Nausea  Improving but not resolved.  Ileus resolved, needed NG decompression earlier this stay w TPN 7/21-8/13.  Baseline necessary opioid use.  H/O TPN dependence.  - Continue ND tube feeds (Osmolite 1.5 25ml/h, free water flush 50ml q3h) though probably needs closer to H2O q3h instead of 50ml q3h but patient trying to prioritize PO intake.   - Continue bisacodyl, PEG, senna PRN for now as is having stools spontaneously.  - Continue naloxegol.  - Allowing IV promethazine PRN.     Generalized anxiety disorder, depression, h/o panic attacks.  - Continue home mirtazapine.  - Since using diazepam (see below), will use this to treat anxiety  as well after discussion w Psych 8/27.    - Continue diazepam 2.5/2.5/5 mg with extra 2.5 mg PRN at night   - If needed could give 2.5mg  PO diazepam for acute anxiety.  - Appreciate Psych???s assistance.     Transaminase elevation, fluctuating but improving  Unclear etiology, possibly related to cefazolin or fluconazole.  Previous transaminase elevation thought to be 2/2 TPN as LFT rise preceded starting fluconazole.  Liver U/S w Doppler 8/15 unremarkable.  -Continue to monitor daily.    Enterobacteriaceae group bacteremia, C albicans fungemia, resolved (treatment completed)  Likely due to RIJ tunneled line placed 01/2022 now removed.  8/13, 8/17 repeat blood cultures NGTD.  No evidence of vitreitis, retinitis 8/15 per Ophtho.  TTE 8/15 w possible vegetations on MV and TV or indwelling catheter; however, TEE 8/20 w/o vegetations.  (Cefepime 8/13-8/20, metronidazole 8/13-8/16, micafungin 8/14-8/22). Completed cefazolin (started 8/20), fluconazole on 8/31     Oral candidiasis.  Resolved.     Fe def anemia.  S/P 1.02g IV Fe (ferumoxytol).  H/O Fe hypersensitivity reactions and evaluated by [name] [name].     SIBO.  Typically on cefdinir MWF, doxy TTSS at home.     Coffee-ground emesis earlier this stay likely 2/2 NGT.  -Continue PPI BID for now.     Daily checklist:  VTE prophylaxis: SCDs, declines chemical prophylaxis given ulcers and predisposition to GI bleeding  Diet: Supplement Adult; Ensure Plus High Protein (High Calorie/High Protein); # of Products PER Serving: 1 2xd PC  Osmolite 1.5 (Standard 1.5 Cal) continuous tube feed  Nutrition Therapy Regular/House  Code status: Full Code  HCDM:   HCDM ([name] [name], no known patient preference): [name],[name] - Mother - [phone]    HCDM, back-up (If primary HCDM is unavailable): [name] [name] - Father - [phone]    Disposition: Likely AIR once ready. See checklist from PM&R.   [ ]  CBC/BMP & other pertinent labs within 48 hours of AIR   [ ]  Prefer oral pain meds only, but if on IV pain meds, then no more than q 6-8 hours prn   [ ]  6 minute walk test completed for home oxygen requirement   [ ]  If on oxygen, no more than 4 LPM [place]   [ ]  Chemotherapy plan documented   [ ]  Currently needing TF via NGT. May need to discharge to home with NGT. Feeding plan ongoing   [ ]  Documentation of therapy tolerance     I personally spent 50 minutes face-to-face and non-face-to-face in the care of this patient, which includes all pre, intra, and post visit time on the date of service.  All documented time was specific to the E/M visit and does not include any procedures that may have been performed.      ___________________________________________________________________    Subjective:  No significant events overnight but pain overall seemed worse and feeling weaker with standing, nearly falling. Trying to rest today. No other major changes.    Recent Results (from the past 24 hour(s))   Comprehensive Metabolic Panel    Collection Time: 11/10/22  5:42 AM   Result Value Ref Range    Sodium 141 135 - 145 mmol/L    Potassium 3.8 3.4 - 4.8 mmol/L    Chloride 105 98 - 107 mmol/L    CO2 32.0 (H) 20.0 - 31.0 mmol/L    Anion Gap 4 (L) 5 - 14 mmol/L    BUN 8 (L) 9 - 23 mg/dL    Creatinine 3.01 (L) 0.55 -  1.02 mg/dL    BUN/Creatinine Ratio 17     eGFR CKD-EPI (2021) Female >90 >=60 mL/min/1.45m2    Glucose 111 70 - 179 mg/dL    Calcium 9.5 8.7 - 16.1 mg/dL    Albumin 3.1 (L) 3.4 - 5.0 g/dL    Total Protein 5.9 5.7 - 8.2 g/dL    Total Bilirubin 0.2 (L) 0.3 - 1.2 mg/dL    AST 35 (H) <=09 U/L    ALT 45 10 - 49 U/L    Alkaline Phosphatase 150 (H) 46 - 116 U/L     Labs/Studies:  Labs and Studies from the last 24hrs per EMR and Reviewed    Objective:  Temp:  [36.6 ??C (97.9 ??F)] 36.6 ??C (97.9 ??F)  Heart Rate:  [73-141] 82  Resp:  [17-18] 17  BP: (100-117)/(59-72) 115/72  SpO2:  [100 %] 100 %    General: No acute distress.   HEENT: MMM, oropharynx clear.  Corpak in place  Cardiac: RRR, no M/R/G  Pulmonary: Normal work of breathing, clear bilaterally, no wheezes or crackles  Abdomen: Soft, Not particularly distended, scar tissue present.  Similar area of very mild swelling in low back underneath lidocaine patches.    Extremities: No edema, cyanosis, or clubbing. Warm  Neuro: CN 2-12 intact, no gross motor deficits  Psych: Oriented to self, place, and time. Normal speech and affect.  Central line in the neck

## 2022-11-11 NOTE — Unmapped (Signed)
Pt A & O x 4 and able to express needs . Pt continued to c/o anxiety , abdominal pain with nausea . Denied SOB/dyspnea . PRN doses of  Dilaudid , Zofran and Phenergan adm as ordered as per MAR . Assistance with ADLs provided . Falls and safety precautions reinfroced with bed low and locked , SR up x 2 , call bell within reach . Pt resting quietly in bed with eyes closed . No acute distress noted . Will continue with current POC . Mother @ the bedside and very supportive .     Problem: Adult Inpatient Plan of Care  Goal: Plan of Care Review  Outcome: Progressing  Goal: Patient-Specific Goal (Individualized)  Outcome: Progressing  Flowsheets (Taken 11/11/2022 0551)  Patient/Family-Specific Goals (Include Timeframe): Pt will have adequate pain mgmt and remain free from falls and injuries during this shift 7p -7a .  Individualized Care Needs: V/S ,labs , pain & nausea mgmt , Cor pak , Cont. TF  , Poor PO intake , falls/safety precautions .  Anxieties, Fears or Concerns: Pt concerned about pain & nausea mgmt  Goal: Absence of Hospital-Acquired Illness or Injury  Outcome: Progressing  Intervention: Identify and Manage Fall Risk  Recent Flowsheet Documentation  Taken 11/11/2022 0200 by Harlow Ohms, RN  Safety Interventions:   fall reduction program maintained   environmental modification   low bed   nonskid shoes/slippers when out of bed  Taken 11/11/2022 0000 by Harlow Ohms, RN  Safety Interventions:   fall reduction program maintained   environmental modification   low bed   family at bedside   nonskid shoes/slippers when out of bed  Taken 11/10/2022 2200 by Harlow Ohms, RN  Safety Interventions:   fall reduction program maintained   environmental modification   low bed   family at bedside   nonskid shoes/slippers when out of bed  Taken 11/10/2022 2000 by Harlow Ohms, RN  Safety Interventions:   fall reduction program maintained   environmental modification   low bed   nonskid shoes/slippers when out of bed   family at bedside  Intervention: Prevent Skin Injury  Recent Flowsheet Documentation  Taken 11/10/2022 2000 by Harlow Ohms, RN  Positioning for Skin: Supine/Back  Device Skin Pressure Protection: absorbent pad utilized/changed  Skin Protection: incontinence pads utilized  Intervention: Prevent and Manage VTE (Venous Thromboembolism) Risk  Recent Flowsheet Documentation  Taken 11/11/2022 0200 by Annamaria Helling A, RN  Anti-Embolism Device Type: SCD, Knee  Anti-Embolism Intervention: On  Anti-Embolism Device Location: BLE  Taken 11/11/2022 0000 by Harlow Ohms, RN  Anti-Embolism Device Type: SCD, Knee  Anti-Embolism Intervention: On  Anti-Embolism Device Location: BLE  Taken 11/10/2022 2200 by Melburn Popper, Artha Chiasson A, RN  Anti-Embolism Device Type: SCD, Knee  Anti-Embolism Intervention: On  Anti-Embolism Device Location: BLE  Taken 11/10/2022 2000 by Harlow Ohms, RN  VTE Prevention/Management: anticoagulant therapy  Anti-Embolism Device Type: SCD, Knee  Anti-Embolism Intervention: On  Anti-Embolism Device Location: BLE  Intervention: Prevent Infection  Recent Flowsheet Documentation  Taken 11/11/2022 0200 by Harlow Ohms, RN  Infection Prevention: environmental surveillance performed  Taken 11/11/2022 0000 by Harlow Ohms, RN  Infection Prevention: rest/sleep promoted  Taken 11/10/2022 2200 by Harlow Ohms, RN  Infection Prevention: environmental surveillance performed  Taken 11/10/2022 2000 by Harlow Ohms, RN  Infection Prevention: environmental surveillance performed  Goal: Optimal Comfort and Wellbeing  Outcome: Progressing  Goal: Readiness for Transition of Care  Outcome: Progressing  Goal: Rounds/Family Conference  Outcome: Progressing

## 2022-11-11 NOTE — Unmapped (Signed)
Department of Anesthesiology  Pain Medicine Division    Chronic Pain Followup Inpatient Consult Note    Requesting Attending Physician:  Earna Coder, MD  Service Requesting Consult:  Med Bernita Raisin [facility])    Assessment/Recommendations:  The patient was seen in consultation on request of Rocco Serene, MD regarding assistance with pain management for patient's chronic intractable abdominal pain with now acute on chronic recurrent ileus/NG tube placement.  Primary team requesting consideration for ketamine infusion or lidocaine infusion, to reduce narcotic usage due to concern for narcotic bowel syndrome and now has an ileus that has resolved but course has been complicated by a line infection and she has completed IV abx for.      Patient currently established with [facility] pain management center Dr. Dyann Ruddle, has been on oxycodone 10 mg 3 times daily, Suboxone 0.5 mg 3 times daily along with baclofen and pregabalin. Overall goal is to wean to buprenorphine and non-opioid medications and eventually off of buprenorphine as well. Through her psychiatrist she is on Ativan 0.5 mg 3 times daily as needed. She has contracted to avoid taking the Ativan at the same time or close to when she takes her opioid pain medications.    Patient has had multiple prior inpatient stays either regarding acute exacerbation of chronic pain or ileus.  In the past 12 months she has had 2 infusions of ketamine with limited benefit (last infusion in May 2024) and at one point there was some transaminitis which was thought to be attributed from other etiologies like TPN versus other intra-abdominal pathologies.  In the past patient also has had lidocaine infusions during inpatient stay with limited/variable benefit.           This is no doubt a very complex situation. Currently her new goal is to get to acute inpatient rehab at [facility]. She understands the barriers of her IV pain medications, her activity tolerance and other medical barriers. She was making progress but had a set back with the line injection a few weeks back and was not able to get out of bed for 2 weeks. She is currently trying to work more with therapy and working towards tolerating 3 hours. She was able to go outside over the weekend and walked with PT today. We encourage continued PT and try to extend her time between the IV dilaudid which is going to try to do.       Interval Events: she reports new back pain after some physical exam maneuvers, but was able to decrease her IV dilaudid from 1mg  to 0.5mg  and now is working towards decreasing the frequency of how often she takes the IV dilaudid. We dicussed keeping current regimen while trying to increase activity tolerance to get to the goal of AIR      Vital Sign Trends: Vitally stable, afebrile.    Current Hospital Analgesic Medications:  Baclofen 10 mg TID  Suboxone 1-0.5 mg SL TID (0.25 film)   Valium 2.5mg  Am and 5mg  nightly   Gabapentin 100mg  TID   APAP 650 mg q6 PRN  Oxycodone 10mg  or 15mg  q4h PRN (has been taking the 15mg  around 3-4 times a day)   IV dilaudid 0.5mg  q4h PRN ( has been taking every 4-5 hours)    Recommendations:  -The chronic pain service is a consult service and does not place orders, just makes recommendations (except ketamine and lidocaine infusions)   -Please evaluate all patients on opioids for appropriateness of prescribing narcan at  discharge.  The chronic pain service can assist with this.  Nasal narcan is covered by most insurances.  -Recommendations given apply to the current hospitalization and do not reflect long term recommendations.    - Continue Baclofen,valium, gabapentin, oxycodone and dilaudid as above   - Continue PRN acetaminophen  - Continue Suboxone 2-0.5, may consider increasing dose if having trouble decrease frequency of IV dilaudid to q6h PRN  - highly recommend out of bed exercises, time outside, working with therapy as much as possible   - will need to see increase tolerance of activity for AIR admission   - Recommend continued physical therapy,  please try to see daily or give family/nursing staff exercises to complete   - Recommend involvement of inpatient psychology if possible and complex care given patient's distress surrounding hospitalization  - Patient would likely benefit from pain psychology evaluation post-discharge (in [facility])    We will continue to follow.    Naloxone Rx at discharge?  Is patient on opioids? Yes.  1)Is dose >50MME?  Yes.  2) Is patient prescribed a benzodiazepine (w opioids)? Yes.  3)Hx of overdose?  No.  4) Hx of substance use disorder? No.  5) Opioids likely to last greater than a week after discharge? Yes.     If yes to 2 or more, prescribe naloxone at discharge.  Nasal narcan for most insured (Nasal narcan 4mg /actuation, prescribe 1 kit, instructions at [email or web address]).  For uninsured, chronic pain can work to assist in finding an option.  OTC nasal narcan now available at most pharmacies for around $45.    Interim History  There were no Acute Events Overnight.  The patient is not obtaining adequate pain relief on current medication regimen and feels that their pain is Somewhat controlled    Inpatient Medications  Current Facility-Administered Medications   Medication Dose Route Frequency Provider Last Rate Last Admin    acetaminophen (TYLENOL) tablet 650 mg  650 mg Enteral tube: gastric Q6H PRN Anell Barr, MD   650 mg at 10/22/22 2123    aluminum-magnesium hydroxide-simethicone (MAALOX PLUS) 200-200-20 mg/5 mL suspension 60 mL  60 mL Oral Q6H PRN Anell Barr, MD        baclofen (LIORESAL) tablet 10 mg  10 mg Oral TID Earna Coder, MD   10 mg at 11/11/22 1455    bisacodyl (DULCOLAX) suppository 10 mg  10 mg Rectal BID PRN Anell Barr, MD        buprenorphine-naloxone (SUBOXONE) 2-0.5 mg SL film 0.5 mg of buprenorphine  0.25 Film Sublingual TID Dacillo-Curso, Amy, ACNP   0.5 mg of buprenorphine at 11/11/22 1455    carboxymethylcellulose sodium (REFRESH CELLUVISC) 1 % ophthalmic gel 1 drop  1 drop Both Eyes QID Dacillo-Curso, Amy, ACNP   1 drop at 11/11/22 1215    carboxymethylcellulose sodium (REFRESH CELLUVISC) 1 % ophthalmic gel 2 drop  2 drop Both Eyes QID PRN Dacillo-Curso, Amy, ACNP   2 drop at 11/01/22 1655    cetirizine (ZYRTEC) tablet 10 mg  10 mg Enteral tube: gastric Daily Anell Barr, MD   10 mg at 11/11/22 1002    CHEMO CLARIFICATION ORDER   Other Continuous PRN Dacillo-Curso, Amy, ACNP        diazePAM (VALIUM) tablet 2.5 mg  2.5 mg Oral Daily Kateri Plummer, MD   2.5 mg at 11/11/22 1003    And    diazePAM (VALIUM) tablet 2.5 mg  2.5 mg Oral  Daily Kateri Plummer, MD   2.5 mg at 11/11/22 1455    diazePAM (VALIUM) tablet 2.5 mg  2.5 mg Oral Nightly PRN Kateri Plummer, MD        diazePAM (VALIUM) tablet 5 mg  5 mg Oral Nightly Kateri Plummer, MD   5 mg at 11/10/22 2112    diclofenac sodium (VOLTAREN) 1 % gel 2 g  2 g Topical QID Dacillo-Curso, Amy, ACNP   2 g at 11/11/22 1216    famotidine (PEPCID) tablet 20 mg  20 mg Oral Nightly Anell Barr, MD   20 mg at 11/10/22 2121    gabapentin (NEURONTIN) capsule 100 mg  100 mg Oral TID Dacillo-Curso, Amy, ACNP   100 mg at 11/11/22 1455    guaiFENesin (ROBITUSSIN) oral syrup  200 mg Oral Q4H PRN Anell Barr, MD   200 mg at 10/17/22 2344    hydrocortisone 2.5 % cream   Topical BID PRN Dacillo-Curso, Amy, ACNP   Given at 10/27/22 2300    HYDROmorphone (PF) (DILAUDID) injection 0.5 mg  0.5 mg Intravenous Q4H PRN Earna Coder, MD   0.5 mg at 11/11/22 1215    lidocaine (ASPERCREME) 4 % 2 patch  2 patch Transdermal Daily Kateri Plummer, MD   2 patch at 11/11/22 1010    mirtazapine (REMERON) tablet 15 mg  15 mg Enteral tube: gastric Nightly Anell Barr, MD   15 mg at 11/10/22 2121    mucositis mixture (with lidocaine)  10 mL Mouth QID PRN Dacillo-Curso, Amy, ACNP   10 mL at 10/26/22 2146    multivitamin with folic acid 400 mcg tablet 1 tablet  1 tablet Oral Daily Anell Barr, MD   1 tablet at 11/11/22 1002    naloxegol (MOVANTIK) 12.5 mg tablet 12.5 mg  12.5 mg Oral Daily Anell Barr, MD   12.5 mg at 11/11/22 1004    nirogacestat (OGSIVEO) tablet 50 mg *Patient Supplied*  50 mg Oral BID Earna Coder, MD   50 mg at 10/20/22 2035    ondansetron [facility]) injection 4 mg  4 mg Intravenous Q12H PRN Dacillo-Curso, Amy, ACNP   4 mg at 11/11/22 0708    oxyCODONE (ROXICODONE) immediate release tablet 10 mg  10 mg Oral Q4H PRN Anell Barr, MD   10 mg at 11/06/22 1331    Or    oxyCODONE (ROXICODONE) immediate release tablet 15 mg  15 mg Oral Q4H PRN Anell Barr, MD   15 mg at 11/11/22 1456    pantoprazole (Protonix) EC tablet 40 mg  40 mg Oral BID Anell Barr, MD   40 mg at 11/11/22 1002    phenol (CHLORASEPTIC) 1.4 % spray 2 spray  2 spray Mucous Membrane Q2H PRN Dacillo-Curso, Amy, ACNP   2 spray at 10/03/22 0138    polyethylene glycol (MIRALAX) packet 17 g  17 g Oral BID PRN Anell Barr, MD   17 g at 11/10/22 2132    promethazine (PHENERGAN) 6.5 mg in sodium chloride (NS) 0.9 % 50 mL IVPB  6.5 mg Intravenous Q6H PRN Dacillo-Curso, Amy, ACNP   Stopped at 11/11/22 1042    pseudoephedrine (SUDAFED) tablet 30 mg  30 mg Oral Daily PRN Anell Barr, MD        [facility]) tablet 1 tablet  1 tablet Oral BID PRN Anell Barr, MD   1 tablet at 11/06/22 0110    sodium chloride (NS)  0.9 % infusion  10 mL/hr Intravenous Continuous Dacillo-Curso, Amy, ACNP 10 mL/hr at 11/09/22 0314 10 mL/hr at 11/09/22 0314    zinc oxide-cod liver oil (DESITIN 40%) Paste   Topical Daily PRN Dacillo-Curso, Amy, ACNP   Given at 10/13/22 1015     Objective:     Vital Signs    Temp:  [36.4 ??C (97.5 ??F)-36.7 ??C (98.1 ??F)] 36.4 ??C (97.5 ??F)  Heart Rate:  [72-96] 79  Resp:  [16] 16  BP: (105-112)/(61-66) 112/61  MAP (mmHg):  [74-77] 76  SpO2:  [99 %-100 %] 100 %    Physical Exam GENERAL:  Well developed, well-nourished female in no distress.  HEAD/NECK:    Normocephalic/atraumatic. NGT present.  CARDIOVASCULAR:   WWP, regular tate  LUNGS:   Normal work of breathing, [place]  ABDOMEN  Soft, non-distended.  EXTREMITIES:  Warm and well-perfused.  NEUROLOGIC:    The patient was alert and oriented times four with normal language, attention, cognition and memory. Cranial nerve exam was grossly normal.    MUSCULOSKELETAL:    SKIN:  No obvious rashes lesions or erythema  PSY:  Appropriate affect and mood.    Test Results    Lab Results   Component Value Date    CREATININE 0.42 (L) 11/11/2022     Lab Results   Component Value Date    ALKPHOS 154 (H) 11/11/2022    BILITOT 0.2 (L) 11/11/2022    BILIDIR 0.10 10/29/2022    PROT 6.0 11/11/2022    ALBUMIN 3.1 (L) 11/11/2022    ALT 50 (H) 11/11/2022    AST 59 (H) 11/11/2022     Problem List    Principal Problem:    Other acute postprocedural pain  Active Problems:    [name]    Intestinal polyps    [name]    Neoplasm related pain    Physical deconditioning    History of colectomy    Intractable abdominal pain    Generalized anxiety disorder with panic attacks    SBO (small bowel obstruction) (CMS-HCC)    Small intestinal bacterial overgrowth (SIBO)

## 2022-11-11 NOTE — Unmapped (Signed)
Pt A&Ox4, VSS, assist x1 to [facility], c/o pain to lower back and abdomen, PRN Dilaudid and oxycodone given per orders, PRN phenergan given for nausea, central line patent with good blood return in all lumens, no calls from remote telemetry this shift, continuing with plan of care.    Problem: Adult Inpatient Plan of Care  Goal: Plan of Care Review  Outcome: Progressing  Goal: Patient-Specific Goal (Individualized)  Outcome: Progressing  Goal: Absence of Hospital-Acquired Illness or Injury  Outcome: Progressing  Intervention: Prevent and Manage VTE (Venous Thromboembolism) Risk  Recent Flowsheet Documentation  Taken 11/10/2022 0800 by Camila Li, RN  Anti-Embolism Device Type: SCD, Knee  Anti-Embolism Intervention: On  Anti-Embolism Device Location: BLE  Goal: Optimal Comfort and Wellbeing  Outcome: Progressing  Goal: Readiness for Transition of Care  Outcome: Progressing  Goal: Rounds/Family Conference  Outcome: Progressing     Problem: Latex Allergy  Goal: Absence of Allergy Symptoms  Outcome: Progressing     Problem: Fall Injury Risk  Goal: Absence of Fall and Fall-Related Injury  Outcome: Progressing     Problem: Skin Injury Risk Increased  Goal: Skin Health and Integrity  Outcome: Progressing     Problem: Self-Care Deficit  Goal: Improved Ability to Complete Activities of Daily Living  Outcome: Progressing     Problem: Comorbidity Management  Goal: Blood Glucose Levels Within Targeted Range  Outcome: Progressing  Goal: Blood Pressure in Desired Range  Outcome: Progressing     Problem: Nausea and Vomiting  Goal: Nausea and Vomiting Relief  Outcome: Progressing     Problem: Infection  Goal: Absence of Infection Signs and Symptoms  Outcome: Progressing     Problem: Pain Acute  Goal: Optimal Pain Control and Function  Outcome: Progressing     Problem: Malnutrition  Goal: Improved Nutritional Intake  Outcome: Progressing

## 2022-11-12 LAB — COMPREHENSIVE METABOLIC PANEL
ALBUMIN: 3.2 g/dL — ABNORMAL LOW (ref 3.4–5.0)
ALKALINE PHOSPHATASE: 164 U/L — ABNORMAL HIGH (ref 46–116)
ALT (SGPT): 63 U/L — ABNORMAL HIGH (ref 10–49)
ANION GAP: 5 mmol/L (ref 5–14)
AST (SGOT): 57 U/L — ABNORMAL HIGH (ref <=34)
BILIRUBIN TOTAL: 0.3 mg/dL (ref 0.3–1.2)
BLOOD UREA NITROGEN: 9 mg/dL (ref 9–23)
BUN / CREAT RATIO: 20
CALCIUM: 9.8 mg/dL (ref 8.7–10.4)
CHLORIDE: 102 mmol/L (ref 98–107)
CO2: 34 mmol/L — ABNORMAL HIGH (ref 20.0–31.0)
CREATININE: 0.45 mg/dL — ABNORMAL LOW
EGFR CKD-EPI (2021) FEMALE: >90 mL/min/{1.73_m2} (ref >=60)
GLUCOSE RANDOM: 92 mg/dL (ref 70–179)
POTASSIUM: 3.9 mmol/L (ref 3.4–4.8)
PROTEIN TOTAL: 6.2 g/dL (ref 5.7–8.2)
SODIUM: 141 mmol/L (ref 135–145)

## 2022-11-12 MED ADMIN — gabapentin (NEURONTIN) capsule 100 mg: 100 mg | ORAL | @ 13:00:00

## 2022-11-12 MED ADMIN — oxyCODONE (ROXICODONE) immediate release tablet 15 mg: 15 mg | ORAL | @ 13:00:00 | Stop: 2022-11-14

## 2022-11-12 MED ADMIN — senna (SENOKOT) tablet 1 tablet: 1 | ORAL | @ 03:00:00

## 2022-11-12 MED ADMIN — mirtazapine (REMERON) tablet 15 mg: 15 mg | GASTROENTERAL | @ 02:00:00

## 2022-11-12 MED ADMIN — buprenorphine-naloxone (SUBOXONE) 2-0.5 mg SL film 0.5 mg of buprenorphine: .25 | SUBLINGUAL | @ 19:00:00

## 2022-11-12 MED ADMIN — nirogacestat (OGSIVEO) tablet 50 mg *Patient Supplied*: 50 mg | ORAL

## 2022-11-12 MED ADMIN — buprenorphine-naloxone (SUBOXONE) 2-0.5 mg SL film 0.5 mg of buprenorphine: .25 | SUBLINGUAL | @ 02:00:00

## 2022-11-12 MED ADMIN — pantoprazole (Protonix) EC tablet 40 mg: 40 mg | ORAL | @ 13:00:00

## 2022-11-12 MED ADMIN — naloxegol (MOVANTIK) 12.5 mg tablet 12.5 mg: 12.5 mg | ORAL | @ 13:00:00

## 2022-11-12 MED ADMIN — multivitamin with folic acid 400 mcg tablet 1 tablet: 1 | ORAL | @ 13:00:00

## 2022-11-12 MED ADMIN — oxyCODONE (ROXICODONE) immediate release tablet 15 mg: 15 mg | ORAL | Stop: 2022-11-14

## 2022-11-12 MED ADMIN — buprenorphine-naloxone (SUBOXONE) 2-0.5 mg SL film 0.5 mg of buprenorphine: .25 | SUBLINGUAL | @ 13:00:00

## 2022-11-12 MED ADMIN — nirogacestat (OGSIVEO) tablet 50 mg *Patient Supplied*: 50 mg | ORAL | @ 11:00:00

## 2022-11-12 MED ADMIN — baclofen (LIORESAL) tablet 10 mg: 10 mg | ORAL | @ 13:00:00 | Stop: 2022-11-12

## 2022-11-12 MED ADMIN — oxyCODONE (ROXICODONE) immediate release tablet 15 mg: 15 mg | ORAL | @ 21:00:00 | Stop: 2022-11-14

## 2022-11-12 MED ADMIN — gabapentin (NEURONTIN) capsule 100 mg: 100 mg | ORAL | @ 19:00:00

## 2022-11-12 MED ADMIN — zinc oxide-cod liver oil (DESITIN 40%) Paste: TOPICAL | @ 02:00:00

## 2022-11-12 MED ADMIN — sodium bicarbonate tablet 650 mg: 650 mg | GASTROENTERAL | @ 17:00:00 | Stop: 2022-11-12

## 2022-11-12 MED ADMIN — ondansetron (ZOFRAN) injection 4 mg: 4 mg | INTRAVENOUS | @ 03:00:00

## 2022-11-12 MED ADMIN — diclofenac sodium (VOLTAREN) 1 % gel 2 g: 2 g | TOPICAL | @ 02:00:00

## 2022-11-12 MED ADMIN — HYDROmorphone (PF) (DILAUDID) injection 0.5 mg: .5 mg | INTRAVENOUS | @ 02:00:00 | Stop: 2022-11-14

## 2022-11-12 MED ADMIN — diazePAM (VALIUM) tablet 5 mg: 5 mg | ORAL | @ 02:00:00

## 2022-11-12 MED ADMIN — pancrelipase (Lip-Prot-Amyl) (CREON) 24,000-76,000 -120,000 unit delayed release capsule 24,000 units of lipase: 1 | GASTROENTERAL | @ 13:00:00 | Stop: 2022-11-12

## 2022-11-12 MED ADMIN — carboxymethylcellulose sodium (REFRESH CELLUVISC) 1 % ophthalmic gel 1 drop: 1 [drp] | OPHTHALMIC | @ 03:00:00

## 2022-11-12 MED ADMIN — famotidine (PEPCID) tablet 20 mg: 20 mg | ORAL | @ 02:00:00

## 2022-11-12 MED ADMIN — pantoprazole (Protonix) EC tablet 40 mg: 40 mg | ORAL | @ 02:00:00

## 2022-11-12 MED ADMIN — diazePAM (VALIUM) tablet 2.5 mg: 2.5 mg | ORAL | @ 19:00:00

## 2022-11-12 MED ADMIN — diclofenac sodium (VOLTAREN) 1 % gel 2 g: 2 g | TOPICAL | @ 10:00:00

## 2022-11-12 MED ADMIN — sodium bicarbonate tablet 650 mg: 650 mg | GASTROENTERAL | @ 13:00:00 | Stop: 2022-11-12

## 2022-11-12 MED ADMIN — promethazine (PHENERGAN) 6.5 mg in sodium chloride (NS) 0.9 % 50 mL IVPB: 6.5 mg | INTRAVENOUS

## 2022-11-12 MED ADMIN — pancrelipase (Lip-Prot-Amyl) (CREON) 24,000-76,000 -120,000 unit delayed release capsule 24,000 units of lipase: 1 | GASTROENTERAL | @ 17:00:00 | Stop: 2022-11-12

## 2022-11-12 MED ADMIN — HYDROmorphone (PF) (DILAUDID) injection 0.5 mg: .5 mg | INTRAVENOUS | @ 11:00:00 | Stop: 2022-11-14

## 2022-11-12 MED ADMIN — ondansetron (ZOFRAN) injection 4 mg: 4 mg | INTRAVENOUS | @ 15:00:00

## 2022-11-12 MED ADMIN — baclofen (LIORESAL) tablet 10 mg: 10 mg | ORAL | @ 02:00:00

## 2022-11-12 MED ADMIN — HYDROmorphone (PF) (DILAUDID) injection 0.5 mg: .5 mg | INTRAVENOUS | @ 19:00:00 | Stop: 2022-11-14

## 2022-11-12 MED ADMIN — gabapentin (NEURONTIN) capsule 100 mg: 100 mg | ORAL | @ 02:00:00

## 2022-11-12 MED ADMIN — HYDROmorphone (PF) (DILAUDID) injection 0.5 mg: .5 mg | INTRAVENOUS | @ 16:00:00 | Stop: 2022-11-14

## 2022-11-12 MED ADMIN — cetirizine (ZYRTEC) tablet 10 mg: 10 mg | GASTROENTERAL | @ 13:00:00 | Stop: 2022-11-12

## 2022-11-12 MED ADMIN — diazePAM (VALIUM) tablet 2.5 mg: 2.5 mg | ORAL | @ 13:00:00

## 2022-11-12 MED ADMIN — hydrocortisone 2.5 % cream: TOPICAL | @ 02:00:00

## 2022-11-12 MED ADMIN — baclofen (LIORESAL) tablet 10 mg: 10 mg | ORAL | @ 19:00:00 | Stop: 2022-11-12

## 2022-11-12 MED ADMIN — carboxymethylcellulose sodium (REFRESH CELLUVISC) 1 % ophthalmic gel 1 drop: 1 [drp] | OPHTHALMIC | @ 16:00:00

## 2022-11-12 MED ADMIN — HYDROmorphone (PF) (DILAUDID) injection 0.5 mg: .5 mg | INTRAVENOUS | Stop: 2022-11-14

## 2022-11-12 NOTE — Unmapped (Addendum)
A/O x 4, afebrile, VSS on 2L [place], PRN meds given for pain and nausea (see MAR). Triple lumen IJ clean, dry, intact, and flushed per protocol. NG tube clogged overnight, tried to unclog per MAR, no successful, MD aware. Falls precautions maintained. Call bell and bedside table within reach, bed in lowest position and locked. No falls or injuries noted this shift. Will continue with plan of care.    Problem: Adult Inpatient Plan of Care  Goal: Plan of Care Review  Outcome: Progressing  Goal: Patient-Specific Goal (Individualized)  Outcome: Progressing  Flowsheets (Taken 11/12/2022 0944)  Patient/Family-Specific Goals (Include Timeframe): Free from falls or injuries through discharge  Individualized Care Needs: Falls, labs, VS, NG tube, tube feeds  Anxieties, Fears or Concerns: NG clogged  Goal: Absence of Hospital-Acquired Illness or Injury  Outcome: Progressing  Intervention: Prevent and Manage VTE (Venous Thromboembolism) Risk  Recent Flowsheet Documentation  Taken 11/12/2022 0800 by Doree Barthel, RN  Anti-Embolism Intervention: Other (Comment)  Taken 11/12/2022 0702 by Doree Barthel, RN  Anti-Embolism Intervention: Other (Comment)  Goal: Optimal Comfort and Wellbeing  Outcome: Progressing  Goal: Readiness for Transition of Care  Outcome: Progressing  Goal: Rounds/Family Conference  Outcome: Progressing     Problem: Fall Injury Risk  Goal: Absence of Fall and Fall-Related Injury  Outcome: Progressing     Problem: Self-Care Deficit  Goal: Improved Ability to Complete Activities of Daily Living  Outcome: Progressing     Problem: Nausea and Vomiting  Goal: Nausea and Vomiting Relief  Outcome: Progressing     Problem: Pain Acute  Goal: Optimal Pain Control and Function  Outcome: Progressing

## 2022-11-12 NOTE — Unmapped (Signed)
Adolescent and Young Adult Cancer Program Visit     Service date: November 12, 2022    Encounter location: Inpatient - Adult    Clinician: [name], LCSW - AYA Clinical Social Worker    Patient identifiers: [name] [name] is a 22 y.o. with desmoid tumors and [name]. [name] [name] uses she/her pronouns.      Visit Summary     Met with the patient for evaluation of needs that may be addressed by [facility].    [name] [name] shared some of the challenges and progress of the past couple of weeks. Shared her insights into some of the ways this has been a transformational hospitalization for her, in bad ways and good. Explored meaning making in context of illness and suffering.    Follow Up/Plan:     Will attempt to see next week; will contact virtually once discharged/transferred to AIR.    Our program will continue to be available to this patient for AYA-appropriate support on an as-needed basis. The patient has our contact information ([email or web address] and has been encouraged to contact us as needed.     Flowsheet     AYA Assessment  Primary Caregiver: Parent  Location Seen: Inpatient  Contact Point: During treatment  Referral Source: AYA team  Issues Discussed: Mental Health  AYA Team Interventions: Supportive counseling  Referrals Made: Psychiatry  Follow Up: As needed  Time Spent (in minutes): 35    11/12/2022     [name], LCSW  Adolescent and Young Adult Visual merchandiser

## 2022-11-12 NOTE — Unmapped (Signed)
Hospital Medicine Progress Note    Assessment/Plan:    Principal Problem:    Other acute postprocedural pain  Active Problems:    [name]    Intestinal polyps    [name]    Neoplasm related pain    Physical deconditioning    History of colectomy    Intractable abdominal pain    Generalized anxiety disorder with panic attacks    SBO (small bowel obstruction) (CMS-HCC)    Small intestinal bacterial overgrowth (SIBO)      [name] [name] is a 22 y.o. y/o female that presents to [facility] with Other acute postprocedural pain.    Acute on chronic pain/acute back pain secondary to MSK injury/abdominal desmoid fibromatosis:  Took increased oxycodone over the last 24 hours.  Continues to require IV hydromorphone at similar frequency.  Patient does not feel that changes can be made at this time.  Was seen by chronic pain who does not recommend any changes at this time.  Of note, patient with history of chronic pain following at [facility] by [name].  Was admitted for increased pain after injection procedure improved initially with lidocaine infusion and was able to taper off IV hydromorphone.  However had a worsening of her pain in the setting of line infection and has been back on IV hydromorphone since 8/13.  Has been tapering over the last week both dose and dosing interval but continues to receive hydromorphone 0.5 mg every 4 hours last 48 hours.  Imaging including MRI and CT without clear causal findings.  Prior to remains to try and improve patient's comfort and mobility to get patient to AIR and remain concerned about effects of long-term opiates on tolerance and hyperalgesia which we discussed today.  -Anesthesia pain consult  -Continue current hydromorphone dosing  -Continue buprenorphine/naloxone 0.5/0.125 mg 3 times daily, consider adjustment if unable to wean hydromorphone  -Encouraged use of oxycodone 10 to 15 mg every 4 hours as needed  -Continue lidocaine patches  -Continue gabapentin    Muscle spasm and stiffness:  Continues to have some symptomatology especially in the LE which she feels the most relief from diazepam.  Did not notice much change in symptoms with reduction of dose of baclofen.  She was taking this as needed prior to arrival.diagnosis remains unclear.  Symptoms worsened after IR guided procedure steroid injection.  Workup included MRI of spine and brain, EMG and autoimmune myopathy panel all negative. Paraneoplastic antibody panel notable for a slightly elevated PQ type calcium channel antibody which is of unclear significance.  Neurology and PM&R do not feel this is consistent with stiff man syndrome especially in light of EMG findings.    -Change baclofen baclofen to 10 mg 3 times daily as needed for spasms  -Continue diazepam at current doses  -Continue gabapentin  -PT/OT recommending 5 times high intensity,, working towards AIR    Nausea/malnutrition:  Was tolerating tube feeds but episode of clogging of tube today.  Has been currently unable to open up with treatment.  Was placed during TEE under anesthesia and may be difficult to replace.  No evidence of ileus.  Having reasonable soft BMs with good bowel regimen.  Previously has required TPN.  -Will attempt to unclog tube with medications  -If unable to unclog, will need attempt at replacement into the duodenum  -Continue bowel regimen  -Continue naloxegol    [name]/desmoid fibromatosis:  Follows with Dr. [name] of [facility] oncology.  Had recent triamcinolone and bupivacaine injection of the  RUE and left chest tumor by IR on 7/18.  Takes nirogacestat 150 mg twice daily but this has been held intermittently due to other medical reasons.  Plan was to start 50 mg twice daily and titrate up by 50 mg each week until back at usual dose.  LFTs have been mildly elevated but overall improved and without a clear etiology.  -Continue nirogacestat at 50 mg twice daily    LFT elevation:  Has had intermittent elevations of ALT, AST and alkaline phosphatase over the hospitalization.  Has remained mildly elevated without a clear etiology.  Did previously receive TPN, cefazolin and fluconazole which all could have contributed.  Had a liver ultrasound on 8/15 negative.  Remains less than 2 times normal and stable from previous.  Restarted nirogacestat yesterday  -Continue to monitor    Enterobacteriaceae GNR/Candida infection related to tunneled line:  Line now removed.  Repeat cultures negative.  Underwent TEE x 2 without evidence of vegetations.  Optho evaluation on 8/15 without evidence of infection.  Completed treatment with cephalosporin and fluconazole on 8/31.  -No further treatment at this time    Iron deficiency anemia:  Received 1 g ferumoxytol during this hospitalization.  Of note, has a history of Fe hypersensitivity reactions and evaluated by [facility].  Has been taking cetirizine since that reaction, but does not have daily symptoms and no expectation of additional iron treatment at this time.  -Discontinue cetirizine    Generalized anxiety disorder/depression:  Previously on lorazepam which has been changed to diazepam after discussion with psychiatry for stiffness.  -Continue diazepam at current dosing  -Continue mirtazapine    FEN: Regular Diet  PPx:  Patient declines chemical prophylaxis due to bleeding risk.  Disposition: Inpatient, working towards AIR discharge  Code: FULL     Donavan Foil, MD  Please page [facility] [phone] with questions.      I personally spent 55 minutes face-to-face and non-face-to-face in the care of this patient, which includes all pre, intra, and post visit time on the date of service.  All documented time was specific to the E/M visit and does not include any procedures that may have been performed.    ___________________________________________________________________    Subjective:    Used increased oral oxycodone last 24 hours, taking 4 dose, but continues to require IV hydromorphone, used 5 doses last 24 hours.  Feeding tube clogged overnight and patient became soaked and tube feeds.  Has been unable to be unclotted despite 2 medication attempts.  Pain continues to be significant issue.  Also notes swelling in bilateral knees which has been an intermittent issue at home but worsening during her hospitalization.  She notes laxity in her kneecaps when up and is concerned she is having popping from her kneecaps.    Objective:    Vital signs in last 24 hours:  Temp:  [36 ??C (96.8 ??F)-36.5 ??C (97.7 ??F)] 36 ??C (96.8 ??F)  Heart Rate:  [77-93] 77  Resp:  [16-18] 16  BP: (99-123)/(58-67) 123/58  MAP (mmHg):  [76-79] 78  SpO2:  [98 %-100 %] 100 %    Intake/Output last 3 shifts:  I/O last 3 completed shifts:  In: 1170 [P.O.:120; NG/GT:1050]  Out: 1750 [Urine:1750]    Physical Exam:  GEN: Sitting up in chair, no distress  EYES: Anicteric  ENT: OP moist  CV:  RRR, Normal S1, S2, and No S3 or murmur  PULM:  Decreased at the bases with good air entry and effort, otherwise clear  ABD: Continued tenderness to palpation  MSK: Diffusely tender bilateral paraspinal lumbar.  Extremities: Increase stiffness in BLE.  Bilateral knees without effusions.  No anterior drawer or lateral or medial laxity.  Patella in normal alignment.    Medications:  Scheduled Meds:   baclofen  10 mg Oral TID    buprenorphine-naloxone  0.25 Film Sublingual TID    carboxymethylcellulose sodium  1 drop Both Eyes QID    cetirizine  10 mg Enteral tube: gastric Daily    diazePAM  2.5 mg Oral Daily    And    diazePAM  2.5 mg Oral Daily    diazePAM  5 mg Oral Nightly    diclofenac sodium  2 g Topical QID    famotidine  20 mg Oral Nightly    gabapentin  100 mg Oral TID    lidocaine  2 patch Transdermal Daily    mirtazapine  15 mg Enteral tube: gastric Nightly    multivitamins (ADULT)  1 tablet Oral Daily    naloxegol  12.5 mg Oral Daily    nirogacestat  50 mg Oral BID    pancrelipase (Lip-Prot-Amyl)  1 capsule Enteral tube: gastric Once    pantoprazole  40 mg Oral BID    sodium bicarbonate  650 mg Enteral tube: gastric Once     Continuous Infusions:   Chemo Clarification Order      sodium chloride 10 mL/hr (11/09/22 0314)     PRN Meds:.acetaminophen, alum-mag-simeth, bisacodyl, carboxymethylcellulose sodium, Chemo Clarification Order, diazePAM, guaiFENesin, hydrocortisone, HYDROmorphone, lidocaine-diphenhydrAMINE-aluminum-magnesium, ondansetron, oxyCODONE **OR** oxyCODONE, phenol, polyethylene glycol, promethazine, pseudoephedrine, senna, zinc oxide-cod liver oil    Labs/Studies:  Labs/Studies reviewed by me.  Notable for values below.    Recent Labs     Units 11/08/22  1039 11/11/22  0620   WBC 10*9/L 5.1 5.2   RBC 10*12/L 3.50* 3.49*   HGB g/dL 9.6* 9.8*   HCT % 45.4* 29.0*   MCV fL 82.8 82.9   MCH pg 27.5 28.0   MCHC g/dL 09.8 11.9   RDW % 14.7* 17.1*   PLT 10*9/L 166 155   MPV fL 10.6 10.3       Recent Labs     Units 11/10/22  0542 11/11/22  0620 11/12/22  0547   NA mmol/L 141 142 141   K mmol/L 3.8 3.9 3.9   CL mmol/L 105 105 102   CO2 mmol/L 32.0* 33.0* 34.0*   BUN mg/dL 8* 7* 9   CREATININE mg/dL 8.29* 5.62* 1.30*   GLU mg/dL 865 784 92       Recent Labs     Units 11/12/22  0547   ALBUMIN g/dL 3.2*   ALT U/L 63*   AST U/L 57*   ALKPHOS U/L 164*   BILITOT mg/dL 0.3   PROT g/dL 6.2       No results for input(s): INR in the last 168 hours.

## 2022-11-12 NOTE — Unmapped (Signed)
A&O x4. Up x1-2 asst. Tolerating PO feed and pills with apple sauce. Reported nausea. Given IV zofran x1. Has corepack NG tube. Receiving TF Osmo at 43ml/hr with 30ml H2O flush. Skin intact. C/o pain along back and around R shoulder blade. Given PRN pain medications. On tele with contin O2 monitor. R internal jugular x3 lumen with KVO. Morning labs sent. At about 0600 RN found corepack tube to be clogged. Waiting for medications from pharmacy to unclog feeding tube.  Problem: Adult Inpatient Plan of Care  Goal: Plan of Care Review  Outcome: Ongoing - Unchanged  Goal: Patient-Specific Goal (Individualized)  Outcome: Ongoing - Unchanged  Goal: Absence of Hospital-Acquired Illness or Injury  Outcome: Ongoing - Unchanged  Intervention: Identify and Manage Fall Risk  Recent Flowsheet Documentation  Taken 11/12/2022 0200 by Colman Cater, Dashawn Bartnick, RN  Safety Interventions:   environmental modification   fall reduction program maintained  Taken 11/12/2022 0000 by Colman Cater, Everlena Mackley, RN  Safety Interventions:   environmental modification   fall reduction program maintained  Taken 11/11/2022 2200 by Colman Cater, Rossana Molchan, RN  Safety Interventions:   environmental modification   fall reduction program maintained  Taken 11/11/2022 2000 by Colman Cater, Rubens Cranston, RN  Safety Interventions:   environmental modification   fall reduction program maintained  Intervention: Prevent Skin Injury  Recent Flowsheet Documentation  Taken 11/12/2022 0200 by Colman Cater, Khris Jansson, RN  Positioning for Skin: Supine/Back  Taken 11/12/2022 0000 by Colman Cater, Sung Renton, RN  Positioning for Skin: Bed in Chair  Taken 11/11/2022 2200 by Colman Cater, Geremy Rister, RN  Positioning for Skin: Sitting in Chair  Taken 11/11/2022 2000 by Colman Cater, Ahnika Hannibal, RN  Positioning for Skin: Sitting in Chair  Skin Protection: incontinence pads utilized  Intervention: Prevent and Manage VTE (Venous Thromboembolism) Risk  Recent Flowsheet Documentation  Taken 11/11/2022 2000 by Colman Cater, Lucus Lambertson, RN  VTE Prevention/Management: ambulation promoted  Anti-Embolism Intervention: (Pt Declined) Other (Comment)  Goal: Optimal Comfort and Wellbeing  Outcome: Ongoing - Unchanged  Goal: Readiness for Transition of Care  Outcome: Ongoing - Unchanged  Goal: Rounds/Family Conference  Outcome: Ongoing - Unchanged     Problem: Latex Allergy  Goal: Absence of Allergy Symptoms  Outcome: Ongoing - Unchanged     Problem: Fall Injury Risk  Goal: Absence of Fall and Fall-Related Injury  Outcome: Ongoing - Unchanged  Intervention: Promote Injury-Free Environment  Recent Flowsheet Documentation  Taken 11/12/2022 0200 by Colman Cater, Messiah Ahr, RN  Safety Interventions:   environmental modification   fall reduction program maintained  Taken 11/12/2022 0000 by Colman Cater, Con Arganbright, RN  Safety Interventions:   environmental modification   fall reduction program maintained  Taken 11/11/2022 2200 by Colman Cater, Keiara Sneeringer, RN  Safety Interventions:   environmental modification   fall reduction program maintained  Taken 11/11/2022 2000 by Colman Cater, Shiloh Southern, RN  Safety Interventions:   environmental modification   fall reduction program maintained     Problem: Skin Injury Risk Increased  Goal: Skin Health and Integrity  Outcome: Ongoing - Unchanged  Intervention: Optimize Skin Protection  Recent Flowsheet Documentation  Taken 11/12/2022 0200 by Colman Cater, Noelly Lasseigne, RN  Pressure Reduction Techniques:   frequent weight shift encouraged   heels elevated off bed  Pressure Reduction Devices: pressure-redistributing mattress utilized  Taken 11/12/2022 0000 by Colman Cater, Fama Muenchow, RN  Pressure Reduction Techniques:   frequent weight shift encouraged   heels elevated off bed  Taken 11/11/2022 2200 by Colman Cater, Montarius Kitagawa, RN  Pressure Reduction Techniques:  frequent weight shift encouraged   heels elevated off bed  Taken 11/11/2022 2000 by Colman Cater, Arturo Freundlich, RN  Pressure Reduction Techniques:   frequent weight shift encouraged   heels elevated off bed  Pressure Reduction Devices: pressure-redistributing mattress utilized  Skin Protection: incontinence pads utilized     Problem: Self-Care Deficit  Goal: Improved Ability to Complete Activities of Daily Living  Outcome: Ongoing - Unchanged     Problem: Comorbidity Management  Goal: Blood Glucose Levels Within Targeted Range  Outcome: Ongoing - Unchanged  Goal: Blood Pressure in Desired Range  Outcome: Ongoing - Unchanged     Problem: Nausea and Vomiting  Goal: Nausea and Vomiting Relief  Outcome: Ongoing - Unchanged     Problem: Infection  Goal: Absence of Infection Signs and Symptoms  Outcome: Ongoing - Unchanged     Problem: Pain Acute  Goal: Optimal Pain Control and Function  Outcome: Ongoing - Unchanged     Problem: Malnutrition  Goal: Improved Nutritional Intake  Outcome: Ongoing - Unchanged

## 2022-11-12 NOTE — Unmapped (Signed)
Department of Anesthesiology  Pain Medicine Division    Chronic Pain Followup Inpatient Consult Note    Requesting Attending Physician:  Earna Coder, MD  Service Requesting Consult:  Med Bernita Raisin [facility])    Assessment/Recommendations:  The patient was seen in consultation on request of Rocco Serene, MD regarding assistance with pain management for patient's chronic intractable abdominal pain with now acute on chronic recurrent ileus/NG tube placement.  Primary team requesting consideration for ketamine infusion or lidocaine infusion, to reduce narcotic usage due to concern for narcotic bowel syndrome and now has an ileus that has resolved but course has been complicated by a line infection and she has completed IV abx for.      Patient currently established with [facility] pain management center Dr. Dyann Ruddle, has been on oxycodone 10 mg 3 times daily, Suboxone 0.5 mg 3 times daily along with baclofen and pregabalin. Overall goal is to wean to buprenorphine and non-opioid medications and eventually off of buprenorphine as well. Through her psychiatrist she is on Ativan 0.5 mg 3 times daily as needed. She has contracted to avoid taking the Ativan at the same time or close to when she takes her opioid pain medications.    Patient has had multiple prior inpatient stays either regarding acute exacerbation of chronic pain or ileus.  In the past 12 months she has had 2 infusions of ketamine with limited benefit (last infusion in May 2024) and at one point there was some transaminitis which was thought to be attributed from other etiologies like TPN versus other intra-abdominal pathologies.  In the past patient also has had lidocaine infusions during inpatient stay with limited/variable benefit.           This is no doubt a very complex situation. Currently her new goal is to get to acute inpatient rehab at [facility]. She understands the barriers of her IV pain medications, her activity tolerance and other medical barriers. She was making progress but had a set back with the line injection a few weeks back and was not able to get out of bed for 2 weeks. She is currently trying to work more with therapy and working towards tolerating 3 hours. She was able to go outside over the weekend and walked with PT today. We encourage continued PT and try to extend her time between the IV dilaudid which is going to try to do.       Interval Events: This morning she had some trouble with her tube feeds and now has a clog in the tubing, she reports pain is a bit worse today due to poor sleep. We continued to encourage her and discussed with PT yesterday to see as often as possible. She tolerated 72 mins of therapy yesterday. We will continue current regimen today.       Vital Sign Trends: Vitally stable, afebrile.    Current Hospital Analgesic Medications:  Baclofen 10 mg TID  Suboxone 1-0.5 mg SL TID (0.25 film)   Valium 2.5mg  Am and 5mg  nightly   Gabapentin 100mg  TID   APAP 650 mg q6 PRN  Oxycodone 10mg  or 15mg  q4h PRN (has been taking the 15mg  around 3-4 times a day)   IV dilaudid 0.5mg  q4h PRN ( has been taking every 4-5 hours)    Recommendations:  -The chronic pain service is a consult service and does not place orders, just makes recommendations (except ketamine and lidocaine infusions)   -Please evaluate all patients on opioids for appropriateness  of prescribing narcan at discharge.  The chronic pain service can assist with this.  Nasal narcan is covered by most insurances.  -Recommendations given apply to the current hospitalization and do not reflect long term recommendations.    - Continue Baclofen,valium, gabapentin, oxycodone and dilaudid as above   - Continue PRN acetaminophen  - Continue Suboxone 2-0.5, may consider increasing dose if having trouble decrease frequency of IV dilaudid to q6h PRN  - highly recommend out of bed exercises, time outside, working with therapy as much as possible   - will need to see increase tolerance of activity for AIR admission   - Recommend continued physical therapy,  please try to see daily or give family/nursing staff exercises to complete   - Recommend involvement of inpatient psychology if possible and complex care given patient's distress surrounding hospitalization  - Patient would likely benefit from pain psychology evaluation post-discharge (in [facility])    We will continue to follow.    Naloxone Rx at discharge?  Is patient on opioids? Yes.  1)Is dose >50MME?  Yes.  2) Is patient prescribed a benzodiazepine (w opioids)? Yes.  3)Hx of overdose?  No.  4) Hx of substance use disorder? No.  5) Opioids likely to last greater than a week after discharge? Yes.     If yes to 2 or more, prescribe naloxone at discharge.  Nasal narcan for most insured (Nasal narcan 4mg /actuation, prescribe 1 kit, instructions at [email or web address]).  For uninsured, chronic pain can work to assist in finding an option.  OTC nasal narcan now available at most pharmacies for around $45.    Interim History  There were no Acute Events Overnight but over night her tube feeds were stopped and then she got a clog which resulted in lots of leakage. She woke up covered in tube feeds and was very upset.   The patient is not obtaining adequate pain relief on current medication regimen and feels that their pain is Somewhat controlled    Inpatient Medications  Current Facility-Administered Medications   Medication Dose Route Frequency Provider Last Rate Last Admin    acetaminophen (TYLENOL) tablet 650 mg  650 mg Enteral tube: gastric Q6H PRN Anell Barr, MD   650 mg at 10/22/22 2123    aluminum-magnesium hydroxide-simethicone (MAALOX PLUS) 200-200-20 mg/5 mL suspension 60 mL  60 mL Oral Q6H PRN Anell Barr, MD        baclofen (LIORESAL) tablet 10 mg  10 mg Oral TID Earna Coder, MD   10 mg at 11/12/22 6045    bisacodyl (DULCOLAX) suppository 10 mg  10 mg Rectal BID PRN Anell Barr, MD buprenorphine-naloxone (SUBOXONE) 2-0.5 mg SL film 0.5 mg of buprenorphine  0.25 Film Sublingual TID Dacillo-Curso, Amy, ACNP   0.5 mg of buprenorphine at 11/12/22 0907    carboxymethylcellulose sodium (REFRESH CELLUVISC) 1 % ophthalmic gel 1 drop  1 drop Both Eyes QID Dacillo-Curso, Amy, ACNP   1 drop at 11/11/22 2238    carboxymethylcellulose sodium (REFRESH CELLUVISC) 1 % ophthalmic gel 2 drop  2 drop Both Eyes QID PRN Dacillo-Curso, Amy, ACNP   2 drop at 11/01/22 1655    cetirizine (ZYRTEC) tablet 10 mg  10 mg Enteral tube: gastric Daily Anell Barr, MD   10 mg at 11/12/22 0906    CHEMO CLARIFICATION ORDER   Other Continuous PRN Dacillo-Curso, Amy, ACNP        diazePAM (VALIUM) tablet 2.5 mg  2.5 mg  Oral Daily Kateri Plummer, MD   2.5 mg at 11/12/22 8657    And    diazePAM (VALIUM) tablet 2.5 mg  2.5 mg Oral Daily Kateri Plummer, MD   2.5 mg at 11/11/22 1455    diazePAM (VALIUM) tablet 2.5 mg  2.5 mg Oral Nightly PRN Kateri Plummer, MD        diazePAM (VALIUM) tablet 5 mg  5 mg Oral Nightly Kateri Plummer, MD   5 mg at 11/11/22 2226    diclofenac sodium (VOLTAREN) 1 % gel 2 g  2 g Topical QID Dacillo-Curso, Amy, ACNP   2 g at 11/12/22 0548    famotidine (PEPCID) tablet 20 mg  20 mg Oral Nightly Anell Barr, MD   20 mg at 11/11/22 2218    gabapentin (NEURONTIN) capsule 100 mg  100 mg Oral TID Dacillo-Curso, Amy, ACNP   100 mg at 11/12/22 0906    guaiFENesin (ROBITUSSIN) oral syrup  200 mg Oral Q4H PRN Anell Barr, MD   200 mg at 10/17/22 2344    hydrocortisone 2.5 % cream   Topical BID PRN Dacillo-Curso, Amy, ACNP   Given at 11/11/22 2223    HYDROmorphone (PF) (DILAUDID) injection 0.5 mg  0.5 mg Intravenous Q4H PRN Earna Coder, MD   0.5 mg at 11/12/22 0723    lidocaine (ASPERCREME) 4 % 2 patch  2 patch Transdermal Daily Kateri Plummer, MD   2 patch at 11/11/22 1010    mirtazapine (REMERON) tablet 15 mg  15 mg Enteral tube: gastric Nightly Anell Barr, MD   15 mg at 11/11/22 2218    mucositis mixture (with lidocaine)  10 mL Mouth QID PRN Dacillo-Curso, Amy, ACNP   10 mL at 10/26/22 2146    multivitamin with folic acid 400 mcg tablet 1 tablet  1 tablet Oral Daily Anell Barr, MD   1 tablet at 11/12/22 8469    naloxegol (MOVANTIK) 12.5 mg tablet 12.5 mg  12.5 mg Oral Daily Anell Barr, MD   12.5 mg at 11/12/22 6295    nirogacestat (OGSIVEO) tablet 50 mg *Patient Supplied*  50 mg Oral BID Earna Coder, MD   50 mg at 11/12/22 0726    ondansetron [facility]) injection 4 mg  4 mg Intravenous Q12H PRN Dacillo-Curso, Amy, ACNP   4 mg at 11/11/22 2238    oxyCODONE (ROXICODONE) immediate release tablet 10 mg  10 mg Oral Q4H PRN Anell Barr, MD   10 mg at 11/06/22 1331    Or    oxyCODONE (ROXICODONE) immediate release tablet 15 mg  15 mg Oral Q4H PRN Anell Barr, MD   15 mg at 11/11/22 2026    pancrelipase (Lip-Prot-Amyl) (CREON) 24,000-76,000 -120,000 unit delayed release capsule 24,000 units of lipase  1 capsule Enteral tube: gastric Once Dholakia, Vib[place], AGNP        pantoprazole (Protonix) EC tablet 40 mg  40 mg Oral BID Anell Barr, MD   40 mg at 11/12/22 0906    phenol (CHLORASEPTIC) 1.4 % spray 2 spray  2 spray Mucous Membrane Q2H PRN Dacillo-Curso, Amy, ACNP   2 spray at 10/03/22 0138    polyethylene glycol (MIRALAX) packet 17 g  17 g Oral BID PRN Anell Barr, MD   17 g at 11/10/22 2132    promethazine (PHENERGAN) 6.5 mg in sodium chloride (NS) 0.9 % 50 mL IVPB  6.5 mg Intravenous Q6H PRN Dacillo-Curso, Amy, ACNP  Stopped at 11/11/22 1856    pseudoephedrine (SUDAFED) tablet 30 mg  30 mg Oral Daily PRN Anell Barr, MD        senna [facility]) tablet 1 tablet  1 tablet Oral BID PRN Anell Barr, MD   1 tablet at 11/11/22 2237    sodium bicarbonate tablet 650 mg  650 mg Enteral tube: gastric Once Dholakia, Vib[place]en, AGNP        sodium chloride (NS) 0.9 % infusion  10 mL/hr Intravenous Continuous Dacillo-Curso, Amy, ACNP 10 mL/hr at 11/09/22 0314 10 mL/hr at 11/09/22 0314    zinc oxide-cod liver oil (DESITIN 40%) Paste   Topical Daily PRN Dacillo-Curso, Amy, ACNP   Given at 11/11/22 2223     Objective:     Vital Signs    Temp:  [36 ??C (96.8 ??F)-36.5 ??C (97.7 ??F)] 36.4 ??C (97.5 ??F)  Heart Rate:  [77-93] 78  Resp:  [16-18] 18  BP: (99-123)/(58-68) 110/68  MAP (mmHg):  [74-79] 74  SpO2:  [98 %-100 %] 100 %    Physical Exam     GENERAL:  Well developed, well-nourished female in no distress.  HEAD/NECK:    Normocephalic/atraumatic. NGT present.  CARDIOVASCULAR:   WWP, regular tate  LUNGS:   Normal work of breathing, [place]  ABDOMEN  Soft, non-distended.  EXTREMITIES:  Warm and well-perfused.  NEUROLOGIC:    The patient was alert and oriented times four with normal language, attention, cognition and memory. Cranial nerve exam was grossly normal.    MUSCULOSKELETAL:    SKIN:  No obvious rashes lesions or erythema  PSY:  Appropriate affect and mood.    Test Results    Lab Results   Component Value Date    CREATININE 0.45 (L) 11/12/2022     Lab Results   Component Value Date    ALKPHOS 164 (H) 11/12/2022    BILITOT 0.3 11/12/2022    BILIDIR 0.10 10/29/2022    PROT 6.2 11/12/2022    ALBUMIN 3.2 (L) 11/12/2022    ALT 63 (H) 11/12/2022    AST 57 (H) 11/12/2022     Problem List    Principal Problem:    Other acute postprocedural pain  Active Problems:    [name]    Intestinal polyps    [name]    Neoplasm related pain    Physical deconditioning    History of colectomy    Intractable abdominal pain    Generalized anxiety disorder with panic attacks    SBO (small bowel obstruction) (CMS-HCC)    Small intestinal bacterial overgrowth (SIBO)    [place]y [place], DO   Pain Fellow   PGY-5

## 2022-11-12 NOTE — Unmapped (Signed)
Hospital Medicine Progress Note    Assessment/Plan:    Principal Problem:    Other acute postprocedural pain  Active Problems:    [name]    Intestinal polyps    [name]    Neoplasm related pain    Physical deconditioning    History of colectomy    Intractable abdominal pain    Generalized anxiety disorder with panic attacks    SBO (small bowel obstruction) (CMS-HCC)    Small intestinal bacterial overgrowth (SIBO)      [name] [name] is a 22 y.o. y/o female that presents to [facility] with Other acute postprocedural pain.    Acute on chronic pain/acute back pain secondary to MSK injury/abdominal desmoid fibromatosis:  Patient with history of chronic pain following with [facility] choose to, [name].  Was admitted for increased pain after injection procedure improved initially with lidocaine infusion and was able to taper off IV hydromorphone.  However had a worsening of her pain in the setting of line infection and has been back on IV hydromorphone since 8/13.  Has been tapering over the last week both dose and dosing interval but continues to receive hydromorphone 0.5 mg every 4 hours last 24 hours.  CT of lower back, pelvis and thighs reviewed and does not show causal findings to suggest metastatic infection or other cause requiring additional treatment.  Has not taken much oxycodone, with only 1-2 doses daily over the last couple of days.  Discussed at length with patient, mother at bedside and chronic pain service.  Prior to remains to try and improve patient's comfort and mobility to get patient to AIR and remain concerned about effects of long-term opiates on tolerance and hyperalgesia.  Pain continues to to be limiting and chronic pain evaluating other options such as increase in Suboxone or lidocaine infusion to try and achieve this goal and allow for decreased doses of IV hydromorphone.  I encouraged use of p.o. oxycodone to allow spacing out doses.  Will otherwise continue other adjunctive measures.  -Anesthesia pain consult  -Continue current hydromorphone dosing  -Continue buprenorphine/naloxone 0.5/0.125 mg 3 times daily, consider adjustment if unable to wean hydromorphone  -Encouraged use of oxycodone 10 to 50 mg every 4 hours as needed  -Continue lidocaine patches  -Continue gabapentin    Muscle spasm and stiffness:  Developed after IR guided procedure steroid injection.  Was seen by neurology and PM&R.  Workup included MRI of spine and brain, EMG and autoimmune myopathy panel all negative. Paraneoplastic antibody panel notable for a slightly elevated PQ type calcium channel antibody which is of unclear significance.  Neurology and PM&R do not feel this is consistent with stiff man syndrome.  Will start on baclofen and diazepam with improved symptomatology.  Patient feels that diazepam is more helpful than the baclofen.  -Wean baclofen to 10 mg 3 times daily  -Continue diazepam at current doses  -Continue gabapentin  -PT/OT recommending 5 times high intensity,, working towards AIR    [name]/desmoid fibromatosis:  Follows with Dr. [name] of [facility] oncology.  Had recent triamcinolone and bupivacaine injection of the RUE and left chest tumor by IR on 7/18.  Takes nirogacestat 150 mg twice daily but this has been held intermittently due to other medical reasons.  Plan was to start 50 mg twice daily and titrate up by 50 mg each week until back at usual dose.  LFTs have been mildly elevated but overall improved and without a clear etiology.  -Restart nirogacestat at 50  mg twice daily    Nausea:  Continues to tolerate tube feeds without evidence of ileus.  Having reasonable soft BMs with good bowel regimen.  Previously has required TPN.  -Continue nasoduodenal feeds with Osmolite 1.5 Cal 50 mL/h  -Continue bowel regimen  -Continue naloxegol    LFT elevation:  Has been mildly elevated without a clear etiology.  Did receive cefazolin and fluconazole which could have contributed.  Had a liver ultrasound on 8/15 negative.  Remains less than 2 times normal today.  -Continue to monitor    Enterobacteriaceae GNR/Candida infection related to tunneled line:  Line now removed.  Repeat cultures negative.  Underwent TEE x 2 without evidence of vegetations.  Optho evaluation on 8/15 without evidence of infection.  Completed treatment with cephalosporin and fluconazole on 8/31.  -No further treatment at this time    Iron deficiency anemia:  Received 1 g ferumoxytol during this hospitalization.  Of note, has a history of Fe hypersensitivity reactions and evaluated by [facility].     Generalized anxiety disorder/depression:  Previously on lorazepam which has been changed to diazepam after discussion with psychiatry for stiffness.  -Continue diazepam at current dosing  -Continue mirtazapine    FEN: Regular Diet  PPx:  Patient declines chemical prophylaxis due to bleeding risk.  Disposition: Inpatient, working towards AIR discharge  Code: FULL     Donavan Foil, MD  Please page [facility] [phone] with questions.      I personally spent 80 minutes face-to-face and non-face-to-face in the care of this patient, which includes all pre, intra, and post visit time on the date of service.  All documented time was specific to the E/M visit and does not include any procedures that may have been performed.    ___________________________________________________________________    Subjective:    Continues to have significant pain.  Has tolerated the decrease in her frequency but continues to feel that the IV hydromorphone is necessary at current dose.  Pain is mostly in the lower back and abdomen, she relates it to strenuous physical exam done last week.  Was able to work with PT today, was somewhat limited by tachycardia and associated lightheadedness.  She reports having large loose stools.  Denies any urinary retention, noted to have a large bladder on the CT but then had significant urine output and felt like she emptied her bladder well.    Objective:    Vital signs in last 24 hours:  Temp:  [36.4 ??C (97.5 ??F)-36.7 ??C (98.1 ??F)] 36.5 ??C (97.7 ??F)  Heart Rate:  [72-96] 81  Resp:  [16-18] 18  BP: (99-112)/(61-66) 99/65  MAP (mmHg):  [74-77] 76  SpO2:  [99 %-100 %] 100 %    Intake/Output last 3 shifts:  I/O last 3 completed shifts:  In: 170 [P.O.:120; NG/GT:50]  Out: 2900 [Urine:2900]    Physical Exam:  GEN: Sitting up in bed, occasionally tearful, appropriate  EYES:  pupils equal, reactive to light and anicteric  ENT:  OP moist and No lesions  CV:  RRR, Normal S1, S2, and No S3 or murmur  PULM:  Decreased at the bases with good air entry and effort, otherwise clear  ABD:   Tender to light palpation diffusely, soft, and good bowel sounds  MSK: Some lower back nonpitting edema noted.  Diffusely tender bilateral paraspinal lumbar.  Extremities: No distal edema, firm SQ nodule noted anterior and right lateral thigh less than 1 cm consistent with CT    Medications:  Scheduled  Meds:   baclofen  10 mg Oral TID    buprenorphine-naloxone  0.25 Film Sublingual TID    carboxymethylcellulose sodium  1 drop Both Eyes QID    cetirizine  10 mg Enteral tube: gastric Daily    diazePAM  2.5 mg Oral Daily    And    diazePAM  2.5 mg Oral Daily    diazePAM  5 mg Oral Nightly    diclofenac sodium  2 g Topical QID    famotidine  20 mg Oral Nightly    gabapentin  100 mg Oral TID    lidocaine  2 patch Transdermal Daily    mirtazapine  15 mg Enteral tube: gastric Nightly    multivitamins (ADULT)  1 tablet Oral Daily    naloxegol  12.5 mg Oral Daily    nirogacestat  50 mg Oral BID    pantoprazole  40 mg Oral BID     Continuous Infusions:   Chemo Clarification Order      sodium chloride 10 mL/hr (11/09/22 0314)     PRN Meds:.acetaminophen, alum-mag-simeth, bisacodyl, carboxymethylcellulose sodium, Chemo Clarification Order, diazePAM, guaiFENesin, hydrocortisone, HYDROmorphone, lidocaine-diphenhydrAMINE-aluminum-magnesium, ondansetron, oxyCODONE **OR** oxyCODONE, phenol, polyethylene glycol, promethazine, pseudoephedrine, senna, zinc oxide-cod liver oil    Labs/Studies:  Labs/Studies reviewed by me.  Notable for values below.    Recent Labs     Units 11/08/22  1039 11/11/22  0620   WBC 10*9/L 5.1 5.2   RBC 10*12/L 3.50* 3.49*   HGB g/dL 9.6* 9.8*   HCT % 62.1* 29.0*   MCV fL 82.8 82.9   MCH pg 27.5 28.0   MCHC g/dL 30.8 65.7   RDW % 84.6* 17.1*   PLT 10*9/L 166 155   MPV fL 10.6 10.3       Recent Labs     Units 11/09/22  0604 11/10/22  0542 11/11/22  0620   NA mmol/L 143 141 142   K mmol/L 3.7 3.8 3.9   CL mmol/L 106 105 105   CO2 mmol/L 31.0 32.0* 33.0*   BUN mg/dL 9 8* 7*   CREATININE mg/dL 9.62* 9.52* 8.41*   GLU mg/dL 324 401 027       Recent Labs     Units 11/11/22  0620   ALBUMIN g/dL 3.1*   ALT U/L 50*   AST U/L 59*   ALKPHOS U/L 154*   BILITOT mg/dL 0.2*   PROT g/dL 6.0       No results for input(s): INR in the last 168 hours.

## 2022-11-12 NOTE — Unmapped (Signed)
[facility]  Follow-Up Psychiatry Consult Note      Date of admission: 09/25/2022 10:30 AM  Service Date: November 12, 2022  Primary Team: [facility])  LOS:  LOS: 46 days      Assessment:   [name] [name] is a 22 y.o. 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 and reported past psych history of GAD and MDD, admitted 09/25/2022 10:30 AM for other acute postprocedural pain following injection of desmoid tumor with steroid and anesthetic under ultrasound guidance.  Patient was seen in consultation by request of hospitalist on [facility] for evaluation of anxiety and depression.      [name] presents with symptoms consistent with a diagnosis of GAD and MDD, consistent with her historical diagnoses. Symptoms have worsened in the context of prolonged medical hospitalization, with increased anxiety/panic attacks.     Today, patient expressed frustration regarding recent setback, but also noted hope regarding recent improvements with physical therapy. Overall psychotropic medications have been beneficial, and we would recommend continuation of current regimen.     Diagnoses:   Active Hospital problems:  Principal Problem:    Other acute postprocedural pain  Active Problems:    [name]    Intestinal polyps    [name]    Neoplasm related pain    Physical deconditioning    History of colectomy    Intractable abdominal pain    Generalized anxiety disorder with panic attacks    SBO (small bowel obstruction) (CMS-HCC)    Small intestinal bacterial overgrowth (SIBO)       Problems edited/added by me:  No problems updated.    Risk Assessment:  ASQ screening result: not completed    -A full risk assessment was previously performed on 7/31.  Risk assessment remains essentially unchanged.    Current suicide risk: low risk  Current homicide risk: low risk      Recommendations:     Safety and Observation Level:   -- This patient is not currently under IVC. If safety concerns arise, please page psychiatry for an evaluation.    Medications:  -- continue diazepam 2.5mg  qAM + 2.5mg  qafternoon + 5mg  qHS for anxiety, muscle spasms  - continue mirtazapine 15mg  qHS for mood and anxiety    Further Work-up:   -- No further recommendations at this time from a psychiatric standpoint    Behavioral / Environmental:   -- Utilize compassion and acknowledge the patient's experiences while setting clear and realistic expectations for care.     Follow-up:  -- When patient is discharged, please ensure that their AVS includes information about the 17 Suicide & Crisis Lifeline.  -- The patient currently receives mental health care with [facility] and we will attempt to coordinate followup with our psychiatry team on discharge. She is also recently established with a virtual DBT group at [facility] ([employer]) although it is unclear what the status of this group is   -- We will follow as needed at this time.     Thank you for this consult request. Recommendations have been communicated to the primary team. Please page [phone]  for any questions or concerns.     Discussed with and seen by Attending, Olga Coaster, MD, who agrees with the assessment and plan.    [name], MD      Subjective     Relevant events since last seen by psychiatry: patient continuing to  work with PT, hopes to strengthen herself enough to get to inpatient rehab soon    Patient Interview:  Patient seen with mother at bedside. Patient reports significant frustration because she awoke this morning to being drenched with what appears to be her tube feed. We processed this as well as other recent stressors. Provided supportive psychotherapy, discussed recent gains with physical therapy. Patient remains hopeful she'll continue improving to the point of going to rehab but understandably was frustrated with a possible obstruction in one of her lines.     ROS:   All systems reviewed as negative/unremarkable aside from the following pertinent positives and negatives: anxiety regarding getting another bloodstream infection    Collateral:   - Reviewed medical records in Epic    Relevant Updates to past psychiatric, medical/surgical, family, or social history: n/a    Current Medications:  Scheduled Meds:   baclofen  10 mg Oral TID    buprenorphine-naloxone  0.25 Film Sublingual TID    carboxymethylcellulose sodium  1 drop Both Eyes QID    cetirizine  10 mg Enteral tube: gastric Daily    diazePAM  2.5 mg Oral Daily    And    diazePAM  2.5 mg Oral Daily    diazePAM  5 mg Oral Nightly    diclofenac sodium  2 g Topical QID    famotidine  20 mg Oral Nightly    gabapentin  100 mg Oral TID    lidocaine  2 patch Transdermal Daily    mirtazapine  15 mg Enteral tube: gastric Nightly    multivitamins (ADULT)  1 tablet Oral Daily    naloxegol  12.5 mg Oral Daily    nirogacestat  50 mg Oral BID    pantoprazole  40 mg Oral BID     Continuous Infusions:   Chemo Clarification Order      sodium chloride 10 mL/hr (11/09/22 0314)     PRN Meds:.acetaminophen, alum-mag-simeth, bisacodyl, carboxymethylcellulose sodium, Chemo Clarification Order, diazePAM, guaiFENesin, hydrocortisone, HYDROmorphone, lidocaine-diphenhydrAMINE-aluminum-magnesium, ondansetron, oxyCODONE **OR** oxyCODONE, phenol, polyethylene glycol, promethazine, pseudoephedrine, senna, zinc oxide-cod liver oil      Objective:   Vital signs:   Temp:  [36 ??C (96.8 ??F)-36.5 ??C (97.7 ??F)] 36.4 ??C (97.5 ??F)  Heart Rate:  [77-93] 78  Resp:  [16-18] 18  BP: (99-123)/(58-68) 110/68  MAP (mmHg):  [74-79] 74  SpO2:  [98 %-100 %] 100 %    Physical Exam:  Gen: some distress    Mental Status Exam:  Appearance:  appears stated age and in bed   Attitude:    Cooperative, frustrated   Behavior/Psychomotor:  appropriate eye contact and no abnormal movements   Speech/Language:   normal rate, not pressured, normal volume, normal fluency. normal articulation   Mood:  ???frustrated??? Affect:  mood congruent and mostly euthymic, irritable at times   Thought process:  logical, linear, clear, coherent, goal directed   Thought content:    denies thoughts of self-harm. Denies SI, plans, or intent. Denies HI.  No grandiose, self-referential, persecutory, or paranoid delusions noted.   Perceptual disturbances:   behavior not concerning for response to internal stimuli   Attention:  able to attend to interview without fluctuations in consciousness   Concentration:  Able to fully concentrate and attend   Orientation:  grossly oriented.   Memory:  not formally tested, but grossly intact   Fund of knowledge:   not formally assessed   Insight:    Intact   Judgment:   Intact  Impulse Control:  Intact     Relevant laboratory/imaging data was reviewed.    Additional Psychometric Testing:  Not applicable.    Consult Type and Time-Based Documentation:  This patient was evaluated in person.    Time-based billing disclaimer:  I personally spent 45   minutes face-to-face and non-face-to-face in the care of this patient, which includes all pre, intra, and post visit time on the date of service.  All documented time was specific to the E/M visit and does not include any procedures that may have been performed.

## 2022-11-13 LAB — CBC
HEMATOCRIT: 29.5 % — ABNORMAL LOW (ref 34.0–44.0)
HEMOGLOBIN: 10 g/dL — ABNORMAL LOW (ref 11.3–14.9)
MEAN CORPUSCULAR HEMOGLOBIN CONC: 33.9 g/dL (ref 32.0–36.0)
MEAN CORPUSCULAR HEMOGLOBIN: 28.2 pg (ref 25.9–32.4)
MEAN CORPUSCULAR VOLUME: 83 fL (ref 77.6–95.7)
MEAN PLATELET VOLUME: 10.3 fL (ref 6.8–10.7)
PLATELET COUNT: 157 10*9/L (ref 150–450)
RED BLOOD CELL COUNT: 3.55 10*12/L — ABNORMAL LOW (ref 3.95–5.13)
RED CELL DISTRIBUTION WIDTH: 17.3 % — ABNORMAL HIGH (ref 12.2–15.2)
WBC ADJUSTED: 5.5 10*9/L (ref 3.6–11.2)

## 2022-11-13 LAB — COMPREHENSIVE METABOLIC PANEL
ALBUMIN: 3.1 g/dL — ABNORMAL LOW (ref 3.4–5.0)
ALKALINE PHOSPHATASE: 131 U/L — ABNORMAL HIGH (ref 46–116)
ALT (SGPT): 55 U/L — ABNORMAL HIGH (ref 10–49)
ANION GAP: 5 mmol/L (ref 5–14)
AST (SGOT): 43 U/L — ABNORMAL HIGH (ref <=34)
BILIRUBIN TOTAL: 0.4 mg/dL (ref 0.3–1.2)
BLOOD UREA NITROGEN: 8 mg/dL — ABNORMAL LOW (ref 9–23)
BUN / CREAT RATIO: 17
CALCIUM: 9.5 mg/dL (ref 8.7–10.4)
CHLORIDE: 103 mmol/L (ref 98–107)
CO2: 33 mmol/L — ABNORMAL HIGH (ref 20.0–31.0)
CREATININE: 0.46 mg/dL — ABNORMAL LOW
EGFR CKD-EPI (2021) FEMALE: >90 mL/min/{1.73_m2} (ref >=60)
GLUCOSE RANDOM: 86 mg/dL (ref 70–179)
POTASSIUM: 3.9 mmol/L (ref 3.4–4.8)
PROTEIN TOTAL: 6 g/dL (ref 5.7–8.2)
SODIUM: 141 mmol/L (ref 135–145)

## 2022-11-13 MED ADMIN — HYDROmorphone (PF) (DILAUDID) injection 0.5 mg: .5 mg | INTRAVENOUS | @ 19:00:00 | Stop: 2022-11-14

## 2022-11-13 MED ADMIN — sodium bicarbonate tablet 650 mg: 650 mg | GASTROENTERAL | @ 02:00:00 | Stop: 2022-11-12

## 2022-11-13 MED ADMIN — pantoprazole (Protonix) EC tablet 40 mg: 40 mg | ORAL | @ 02:00:00

## 2022-11-13 MED ADMIN — oxyCODONE (ROXICODONE) immediate release tablet 15 mg: 15 mg | ORAL | @ 02:00:00 | Stop: 2022-11-14

## 2022-11-13 MED ADMIN — gabapentin (NEURONTIN) capsule 100 mg: 100 mg | ORAL | @ 02:00:00

## 2022-11-13 MED ADMIN — lidocaine (ASPERCREME) 4 % 2 patch: 2 | TRANSDERMAL | @ 14:00:00

## 2022-11-13 MED ADMIN — oxyCODONE (ROXICODONE) immediate release tablet 15 mg: 15 mg | ORAL | @ 22:00:00 | Stop: 2022-11-14

## 2022-11-13 MED ADMIN — diclofenac sodium (VOLTAREN) 1 % gel 2 g: 2 g | TOPICAL | @ 02:00:00

## 2022-11-13 MED ADMIN — mirtazapine (REMERON) tablet 15 mg: 15 mg | GASTROENTERAL | @ 02:00:00

## 2022-11-13 MED ADMIN — buprenorphine-naloxone (SUBOXONE) 2-0.5 mg SL film 0.5 mg of buprenorphine: .25 | SUBLINGUAL | @ 02:00:00

## 2022-11-13 MED ADMIN — nirogacestat (OGSIVEO) tablet 50 mg *Patient Supplied*: 50 mg | ORAL | @ 02:00:00

## 2022-11-13 MED ADMIN — diazePAM (VALIUM) tablet 5 mg: 5 mg | ORAL | @ 02:00:00

## 2022-11-13 MED ADMIN — oxyCODONE (ROXICODONE) immediate release tablet 15 mg: 15 mg | ORAL | @ 12:00:00 | Stop: 2022-11-14

## 2022-11-13 MED ADMIN — diazePAM (VALIUM) injection 5 mg: 5 mg | INTRAVENOUS | @ 15:00:00 | Stop: 2022-11-13

## 2022-11-13 MED ADMIN — pancrelipase (Lip-Prot-Amyl) (CREON) 24,000-76,000 -120,000 unit delayed release capsule 24,000 units of lipase: 1 | GASTROENTERAL | @ 02:00:00 | Stop: 2022-11-12

## 2022-11-13 MED ADMIN — promethazine (PHENERGAN) 6.5 mg in sodium chloride (NS) 0.9 % 50 mL IVPB: 6.5 mg | INTRAVENOUS | @ 19:00:00

## 2022-11-13 MED ADMIN — gabapentin (NEURONTIN) capsule 100 mg: 100 mg | ORAL | @ 19:00:00

## 2022-11-13 MED ADMIN — pantoprazole (Protonix) EC tablet 40 mg: 40 mg | ORAL | @ 14:00:00

## 2022-11-13 MED ADMIN — carboxymethylcellulose sodium (REFRESH CELLUVISC) 1 % ophthalmic gel 1 drop: 1 [drp] | OPHTHALMIC | @ 02:00:00

## 2022-11-13 MED ADMIN — carboxymethylcellulose sodium (REFRESH CELLUVISC) 1 % ophthalmic gel 1 drop: 1 [drp] | OPHTHALMIC | @ 22:00:00

## 2022-11-13 MED ADMIN — diclofenac sodium (VOLTAREN) 1 % gel 2 g: 2 g | TOPICAL | @ 22:00:00

## 2022-11-13 MED ADMIN — nirogacestat (OGSIVEO) tablet 50 mg *Patient Supplied*: 50 mg | ORAL | @ 22:00:00

## 2022-11-13 MED ADMIN — diclofenac sodium (VOLTAREN) 1 % gel 2 g: 2 g | TOPICAL | @ 15:00:00

## 2022-11-13 MED ADMIN — famotidine (PEPCID) tablet 20 mg: 20 mg | ORAL | @ 02:00:00

## 2022-11-13 MED ADMIN — gabapentin (NEURONTIN) capsule 100 mg: 100 mg | ORAL | @ 14:00:00

## 2022-11-13 MED ADMIN — carboxymethylcellulose sodium (REFRESH CELLUVISC) 1 % ophthalmic gel 1 drop: 1 [drp] | OPHTHALMIC | @ 09:00:00

## 2022-11-13 MED ADMIN — carboxymethylcellulose sodium (REFRESH CELLUVISC) 1 % ophthalmic gel 1 drop: 1 [drp] | OPHTHALMIC | @ 15:00:00

## 2022-11-13 MED ADMIN — buprenorphine-naloxone (SUBOXONE) 2-0.5 mg SL film 0.5 mg of buprenorphine: .25 | SUBLINGUAL | @ 14:00:00

## 2022-11-13 MED ADMIN — HYDROmorphone (PF) (DILAUDID) injection 0.5 mg: .5 mg | INTRAVENOUS | @ 15:00:00 | Stop: 2022-11-14

## 2022-11-13 MED ADMIN — HYDROmorphone (PF) (DILAUDID) injection 0.5 mg: .5 mg | INTRAVENOUS | @ 09:00:00 | Stop: 2022-11-14

## 2022-11-13 MED ADMIN — naloxegol (MOVANTIK) 12.5 mg tablet 12.5 mg: 12.5 mg | ORAL | @ 14:00:00

## 2022-11-13 MED ADMIN — diazePAM (VALIUM) tablet 2.5 mg: 2.5 mg | ORAL | @ 19:00:00

## 2022-11-13 MED ADMIN — HYDROmorphone (PF) (DILAUDID) injection 0.5 mg: .5 mg | INTRAVENOUS | @ 04:00:00 | Stop: 2022-11-14

## 2022-11-13 MED ADMIN — ondansetron (ZOFRAN) injection 4 mg: 4 mg | INTRAVENOUS | @ 15:00:00

## 2022-11-13 MED ADMIN — nirogacestat (OGSIVEO) tablet 50 mg *Patient Supplied*: 50 mg | ORAL | @ 11:00:00

## 2022-11-13 MED ADMIN — multivitamin with folic acid 400 mcg tablet 1 tablet: 1 | ORAL | @ 14:00:00

## 2022-11-13 MED ADMIN — buprenorphine-naloxone (SUBOXONE) 2-0.5 mg SL film 0.5 mg of buprenorphine: .25 | SUBLINGUAL | @ 19:00:00

## 2022-11-13 MED ADMIN — diazePAM (VALIUM) tablet 2.5 mg: 2.5 mg | ORAL | @ 14:00:00

## 2022-11-13 NOTE — Unmapped (Signed)
Hospital Medicine Progress Note    Assessment/Plan:    Principal Problem:    Other acute postprocedural pain  Active Problems:    [name]    Intestinal polyps    [name]    Neoplasm related pain    Physical deconditioning    History of colectomy    Intractable abdominal pain    Generalized anxiety disorder with panic attacks    SBO (small bowel obstruction) (CMS-HCC)    Small intestinal bacterial overgrowth (SIBO)      [name] [name] is a 22 y.o. y/o female that presents to [facility] with Other acute postprocedural pain.    Acute on chronic pain/acute back pain secondary to MSK injury/abdominal desmoid fibromatosis:  Continues to have issues with pain, but sources variable including abdomen and low back.  Taking oxycodone 3-4 doses/day with some decrease in hydromorphone frequency yesterday.  Appreciate Chronic Pain evaluation and discussed with them, they do not recommend any changes at this time but continue to consider options to try to reduce daily usage as we get further from the triggering events  .  Plan continue to be to improve patient's comfort and mobility to get patient to AIR. I have discussed concern with ongoing doses of pain medication and goal to reduce daily needs   -Anesthesia pain consult following  -Continue current hydromorphone dosing  -Continue buprenorphine/naloxone 0.5/0.125 mg 3 times daily, consider adjustment if unable to wean hydromorphone  -Encouraged use of oxycodone 10 to 15 mg every 4 hours as needed over IV  -Continue lidocaine patches  -Continue gabapentin    Muscle spasm and stiffness/difficulty with ambulation:  Initial presenting complaint.  Has improved from admission with start of diazepam but continues to have some symptoms. Have tapered and changed baclofen to prn without much changed.  She continues to note difficulty with gait, especially with knees and feeling of buckling. Diagnosis remains unclear.  Workup included MRI of spine and brain, EMG and autoimmune myopathy panel all negative. Paraneoplastic antibody panel notable for a slightly elevated PQ type calcium channel antibody which is of unclear significance.  Neurology and PM&R do not feel this is consistent with stiff man syndrome especially in light of EMG findings.    -PM&R to reassess gait to consider other support to assist rehab  -Continue baclofen 10 mg 3 times daily as needed for spasms, can restart if patient notes worsening symptoms  -Continue diazepam at current doses  -Continue gabapentin  -PT/OT recommending 5 times high intensity,, working towards AIR    Nausea/malnutrition:  Tube clogged and was replaced at bedside today by me.  Reviewed XR and shows good placement into duodenum.  Can restart tube feeding.  Is having some improvement in intake and interested in considering lowering dose to improve intake further. Having reasonable soft BMs with good bowel regimen.  Previously has required TPN.  -Restart tube feeds  -Nutrition consult  -Continue bowel regimen  -Continue naloxegol    [name]/desmoid fibromatosis:  Follows with Dr. [name] of [facility] oncology.  Had recent triamcinolone and bupivacaine injection of the RUE and left chest tumor by IR on 7/18.  Takes nirogacestat 150 mg twice daily but this has been held intermittently due to other medical reasons.  Plan was to start 50 mg twice daily and titrate up by 50 mg each week until back at usual dose.  LFTs have been mildly elevated but overall improved and without a clear etiology.  -Continue nirogacestat at 50 mg twice daily (started 9/3)  -  Titrate up by week to usual 150 mg bid     LFT elevation:  Has had intermittent elevations of ALT, AST and alkaline phosphatase over the hospitalization.  Remains stable to slightly improved today, without a clear etiology.  Did previously receive TPN, cefazolin and fluconazole which all could have contributed.  Had a liver ultrasound on 8/15 negative.  Now back on her usual [name] treatment.  -Continue to monitor    Right>left leg swelling:  Notes some non pitting edema of area below right knee and foot.  Knee exam without effusion, no laxity.  XR with normal alignment. Has been avoiding   -Check PVL    Enterobacteriaceae GNR/Candida infection related to tunneled line:  Line now removed.  Repeat cultures negative.  Underwent TEE x 2 without evidence of vegetations.  Optho evaluation on 8/15 without evidence of infection.  Completed treatment with cephalosporin and fluconazole on 8/31.  -No further treatment at this time    Iron deficiency anemia:  Received 1 g ferumoxytol during this hospitalization.  Of note, has a history of Fe hypersensitivity reactions and evaluated by [facility].  Has been taking cetirizine since that reaction, but does not have daily symptoms and no expectation of additional iron treatment at this time. Hb and MCV improved  -No further treatment    Generalized anxiety disorder/depression:  Previously on lorazepam which has been changed to diazepam after discussion with psychiatry for stiffness.  -Continue diazepam at current dosing  -Continue mirtazapine    FEN: Regular Diet  PPx:  Patient declines chemical prophylaxis due to bleeding risk.  Disposition: Inpatient, working towards AIR discharge  Code: FULL     Donavan Foil, MD  Please page [facility] [phone] with questions.      I personally spent 60 minutes face-to-face and non-face-to-face in the care of this patient, which includes all pre, intra, and post visit time on the date of service.  All documented time was specific to the E/M visit and does not include any procedures that may have been performed.    ___________________________________________________________________    Subjective:    Tube unable to be unclogged over 24 hours. Anxious about replacement. Continued to have pain but slept better overnight.  Pain has been present in abdomen, back and legs, remains variable.  Took 3 doses of oxycodone yesterday and some decrease in hydromorphone frequency (used 4 times on 9/4).  Having BMs of reasonable size. Mother notes some foot swelling on the right and continued swelling below the right knee.      Objective:    Vital signs in last 24 hours:  Temp:  [36 ??C (96.8 ??F)-36.8 ??C (98.2 ??F)] 36 ??C (96.8 ??F)  Heart Rate:  [78-101] 78  Resp:  [18] 18  BP: (105-125)/(59-81) 105/59  MAP (mmHg):  [73-93] 85  SpO2:  [99 %-100 %] 100 %    Intake/Output last 3 shifts:  I/O last 3 completed shifts:  In: 1000 [NG/GT:1000]  Out: 1900 [Urine:1900]    Physical Exam:  GEN: In bed, no distress  EYES: Anicteric  ENT: OP moist, NG in place  CV:  RRR, Normal S1, S2, and No S3 or murmur  PULM:  Decreased at the bases with good air entry and effort, otherwise clear  ABD: Continued tenderness to palpation, good bowel sound  Extremities: stable stiffness in BLE.  Bilateral knees without effusions. No point tenderness.  Trace edema in right>left foot.    Medications:  Scheduled Meds:   buprenorphine-naloxone  0.25 Film  Sublingual TID    carboxymethylcellulose sodium  1 drop Both Eyes QID    diazePAM  2.5 mg Oral Daily    And    diazePAM  2.5 mg Oral Daily    diazePAM  5 mg Oral Nightly    diclofenac sodium  2 g Topical QID    famotidine  20 mg Oral Nightly    gabapentin  100 mg Oral TID    lidocaine  2 patch Transdermal Daily    mirtazapine  15 mg Enteral tube: gastric Nightly    multivitamins (ADULT)  1 tablet Oral Daily    naloxegol  12.5 mg Oral Daily    nirogacestat  50 mg Oral BID    pantoprazole  40 mg Oral BID     Continuous Infusions:   Chemo Clarification Order      sodium chloride 10 mL/hr (11/09/22 0314)     PRN Meds:.acetaminophen, alum-mag-simeth, baclofen, bisacodyl, carboxymethylcellulose sodium, Chemo Clarification Order, diazePAM, guaiFENesin, hydrocortisone, HYDROmorphone, lidocaine-diphenhydrAMINE-aluminum-magnesium, ondansetron, oxyCODONE **OR** oxyCODONE, phenol, polyethylene glycol, promethazine, pseudoephedrine, senna, zinc oxide-cod liver oil    Labs/Studies:  Labs/Studies reviewed by me.  Notable for values below.    Recent Labs     Units 11/08/22  1039 11/11/22  0620 11/13/22  1039   WBC 10*9/L 5.1 5.2 5.5   RBC 10*12/L 3.50* 3.49* 3.55*   HGB g/dL 9.6* 9.8* 56.4*   HCT % 29.0* 29.0* 29.5*   MCV fL 82.8 82.9 83.0   MCH pg 27.5 28.0 28.2   MCHC g/dL 33.2 95.1 88.4   RDW % 17.0* 17.1* 17.3*   PLT 10*9/L 166 155 157   MPV fL 10.6 10.3 10.3       Recent Labs     Units 11/11/22  0620 11/12/22  0547 11/13/22  1039   NA mmol/L 142 141 141   K mmol/L 3.9 3.9 3.9   CL mmol/L 105 102 103   CO2 mmol/L 33.0* 34.0* 33.0*   BUN mg/dL 7* 9 8*   CREATININE mg/dL 1.66* 0.63* 0.16*   GLU mg/dL 010 92 86       Recent Labs     Units 11/13/22  1039   ALBUMIN g/dL 3.1*   ALT U/L 55*   AST U/L 43*   ALKPHOS U/L 131*   BILITOT mg/dL 0.4   PROT g/dL 6.0

## 2022-11-13 NOTE — Unmapped (Signed)
Department of Anesthesiology  Pain Medicine Division    Chronic Pain Followup Inpatient Consult Note    Requesting Attending Physician:  Earna Coder, MD  Service Requesting Consult:  Med [facility])    Assessment/Recommendations:  The patient was seen in consultation on request of Rocco Serene, MD regarding assistance with pain management for patient's chronic intractable abdominal pain with now acute on chronic recurrent ileus/NG tube placement.  Primary team requesting consideration for ketamine infusion or lidocaine infusion, to reduce narcotic usage due to concern for narcotic bowel syndrome and now has an ileus that has resolved but course has been complicated by a line infection and she has completed IV abx for.      Patient currently established with [facility] pain management center Dr. Dyann Ruddle, has been on oxycodone 10 mg 3 times daily, Suboxone 0.5 mg 3 times daily along with baclofen and pregabalin. Overall goal is to wean to buprenorphine and non-opioid medications and eventually off of buprenorphine as well. Through her psychiatrist she is on Ativan 0.5 mg 3 times daily as needed. She has contracted to avoid taking the Ativan at the same time or close to when she takes her opioid pain medications.    Patient has had multiple prior inpatient stays either regarding acute exacerbation of chronic pain or ileus.  In the past 12 months she has had 2 infusions of ketamine with limited benefit (last infusion in May 2024) and at one point there was some transaminitis which was thought to be attributed from other etiologies like TPN versus other intra-abdominal pathologies.  In the past patient also has had lidocaine infusions during inpatient stay with limited/variable benefit.           This is no doubt a very complex situation. Currently her new goal is to get to acute inpatient rehab at [facility]. She understands the barriers of her IV pain medications, her activity tolerance and other medical barriers. She was making progress but had a set back with the line injection a few weeks back and was not able to get out of bed for 2 weeks. She is currently trying to work more with therapy and working towards tolerating 3 hours. We encourage continued PT and try to extend her time between the IV dilaudid which is going to try to do.       Interval Events: This morning was sleeping well, per nursing report she had a good night. She is still taking the IV dilaudid every 4 hours and pain is stable. Her Corpak has not been able to get de-clogged. No planned changes to her pain plan today.       Vital Sign Trends: Vitally stable, afebrile.    Current Hospital Analgesic Medications:  Baclofen 10 mg TID  Suboxone 1-0.5 mg SL TID (0.25 film)   Valium 2.5mg  Am and 5mg  nightly   Gabapentin 100mg  TID   APAP 650 mg q6 PRN  Oxycodone 10mg  or 15mg  q4h PRN (has been taking the 15mg  around 3-4 times a day)   IV dilaudid 0.5mg  q4h PRN ( has been taking every 4-5 hours)    Recommendations:  -The chronic pain service is a consult service and does not place orders, just makes recommendations (except ketamine and lidocaine infusions)   -Please evaluate all patients on opioids for appropriateness of prescribing narcan at discharge.  The chronic pain service can assist with this.  Nasal narcan is covered by most insurances.  -Recommendations given apply to the current  hospitalization and do not reflect long term recommendations.    - Continue Baclofen,valium, gabapentin, oxycodone and dilaudid as above   - Continue PRN acetaminophen  - Continue Suboxone 2-0.5, may consider increasing dose if having trouble decrease frequency of IV dilaudid to q6h PRN  - highly recommend out of bed exercises, time outside, working with therapy as much as possible   - will need to see increase tolerance of activity for AIR admission   - Recommend continued physical therapy,  please try to see daily or give family/nursing staff exercises to complete   - Recommend involvement of inpatient psychology if possible and complex care given patient's distress surrounding hospitalization  - Patient would likely benefit from pain psychology evaluation post-discharge (in [facility])    We will continue to follow.    Naloxone Rx at discharge?  Is patient on opioids? Yes.  1)Is dose >50MME?  Yes.  2) Is patient prescribed a benzodiazepine (w opioids)? Yes.  3)Hx of overdose?  No.  4) Hx of substance use disorder? No.  5) Opioids likely to last greater than a week after discharge? Yes.     If yes to 2 or more, prescribe naloxone at discharge.  Nasal narcan for most insured (Nasal narcan 4mg /actuation, prescribe 1 kit, instructions at [email or web address]).  For uninsured, chronic pain can work to assist in finding an option.  OTC nasal narcan now available at most pharmacies for around $45.    Interim History  There were no Acute Events Overnight.   The patient is not obtaining adequate pain relief on current medication regimen and feels that their pain is Somewhat controlled    Inpatient Medications  Current Facility-Administered Medications   Medication Dose Route Frequency Provider Last Rate Last Admin    acetaminophen (TYLENOL) tablet 650 mg  650 mg Enteral tube: gastric Q6H PRN Anell Barr, MD   650 mg at 10/22/22 2123    aluminum-magnesium hydroxide-simethicone (MAALOX PLUS) 200-200-20 mg/5 mL suspension 60 mL  60 mL Oral Q6H PRN Anell Barr, MD        baclofen (LIORESAL) tablet 10 mg  10 mg Oral TID PRN Earna Coder, MD        bisacodyl (DULCOLAX) suppository 10 mg  10 mg Rectal BID PRN Anell Barr, MD        buprenorphine-naloxone (SUBOXONE) 2-0.5 mg SL film 0.5 mg of buprenorphine  0.25 Film Sublingual TID Dacillo-Curso, Amy, ACNP   0.5 mg of buprenorphine at 11/12/22 2204    carboxymethylcellulose sodium (REFRESH CELLUVISC) 1 % ophthalmic gel 1 drop  1 drop Both Eyes QID Dacillo-Curso, Amy, ACNP   1 drop at 11/13/22 0501 carboxymethylcellulose sodium (REFRESH CELLUVISC) 1 % ophthalmic gel 2 drop  2 drop Both Eyes QID PRN Dacillo-Curso, Amy, ACNP   2 drop at 11/01/22 1655    CHEMO CLARIFICATION ORDER   Other Continuous PRN Dacillo-Curso, Amy, ACNP        diazePAM (VALIUM) injection 5 mg  5 mg Intravenous Once Hemsey, Huntley Estelle, MD        diazePAM (VALIUM) tablet 2.5 mg  2.5 mg Oral Daily Kateri Plummer, MD   2.5 mg at 11/12/22 1610    And    diazePAM (VALIUM) tablet 2.5 mg  2.5 mg Oral Daily Kateri Plummer, MD   2.5 mg at 11/12/22 1525    diazePAM (VALIUM) tablet 2.5 mg  2.5 mg Oral Nightly PRN Kateri Plummer, MD  diazePAM (VALIUM) tablet 5 mg  5 mg Oral Nightly Kateri Plummer, MD   5 mg at 11/12/22 2204    diclofenac sodium (VOLTAREN) 1 % gel 2 g  2 g Topical QID Dacillo-Curso, Amy, ACNP   2 g at 11/12/22 2213    famotidine (PEPCID) tablet 20 mg  20 mg Oral Nightly Anell Barr, MD   20 mg at 11/12/22 2202    gabapentin (NEURONTIN) capsule 100 mg  100 mg Oral TID Dacillo-Curso, Amy, ACNP   100 mg at 11/12/22 2203    guaiFENesin (ROBITUSSIN) oral syrup  200 mg Oral Q4H PRN Anell Barr, MD   200 mg at 10/17/22 2344    hydrocortisone 2.5 % cream   Topical BID PRN Dacillo-Curso, Amy, ACNP   Given at 11/11/22 2223    HYDROmorphone (PF) (DILAUDID) injection 0.5 mg  0.5 mg Intravenous Q4H PRN Earna Coder, MD   0.5 mg at 11/13/22 0458    lidocaine (ASPERCREME) 4 % 2 patch  2 patch Transdermal Daily Kateri Plummer, MD   2 patch at 11/11/22 1010    mirtazapine (REMERON) tablet 15 mg  15 mg Enteral tube: gastric Nightly Anell Barr, MD   15 mg at 11/12/22 2203    mucositis mixture (with lidocaine)  10 mL Mouth QID PRN Dacillo-Curso, Amy, ACNP   10 mL at 10/26/22 2146    multivitamin with folic acid 400 mcg tablet 1 tablet  1 tablet Oral Daily Anell Barr, MD   1 tablet at 11/12/22 5409    naloxegol (MOVANTIK) 12.5 mg tablet 12.5 mg  12.5 mg Oral Daily Anell Barr, MD   12.5 mg at 11/12/22 8119    nirogacestat (OGSIVEO) tablet 50 mg *Patient Supplied*  50 mg Oral BID Earna Coder, MD   50 mg at 11/13/22 0728    ondansetron [facility]) injection 4 mg  4 mg Intravenous Q12H PRN Dacillo-Curso, Amy, ACNP   4 mg at 11/12/22 1056    oxyCODONE (ROXICODONE) immediate release tablet 10 mg  10 mg Oral Q4H PRN Anell Barr, MD   10 mg at 11/06/22 1331    Or    oxyCODONE (ROXICODONE) immediate release tablet 15 mg  15 mg Oral Q4H PRN Anell Barr, MD   15 mg at 11/13/22 0732    pantoprazole (Protonix) EC tablet 40 mg  40 mg Oral BID Anell Barr, MD   40 mg at 11/12/22 2203    phenol (CHLORASEPTIC) 1.4 % spray 2 spray  2 spray Mucous Membrane Q2H PRN Dacillo-Curso, Amy, ACNP   2 spray at 10/03/22 0138    polyethylene glycol (MIRALAX) packet 17 g  17 g Oral BID PRN Anell Barr, MD   17 g at 11/10/22 2132    promethazine (PHENERGAN) 6.5 mg in sodium chloride (NS) 0.9 % 50 mL IVPB  6.5 mg Intravenous Q6H PRN Dacillo-Curso, Amy, ACNP   Stopped at 11/12/22 2202    pseudoephedrine (SUDAFED) tablet 30 mg  30 mg Oral Daily PRN Anell Barr, MD        senna [facility]) tablet 1 tablet  1 tablet Oral BID PRN Anell Barr, MD   1 tablet at 11/11/22 2237    sodium chloride (NS) 0.9 % infusion  10 mL/hr Intravenous Continuous Dacillo-Curso, Amy, ACNP 10 mL/hr at 11/09/22 0314 10 mL/hr at 11/09/22 0314    zinc oxide-cod liver oil (DESITIN 40%) Paste   Topical Daily PRN Dacillo-Curso,  Amy, ACNP   Given at 11/11/22 2223     Objective:     Vital Signs    Temp:  [36 ??C (96.8 ??F)-36.8 ??C (98.2 ??F)] (P) 36 ??C (96.8 ??F)  Heart Rate:  [78-101] (P) 78  Resp:  [18] (P) 18  BP: (106-125)/(61-81) (P) 105/59  MAP (mmHg):  [73-93] (P) 85  SpO2:  [99 %-100 %] 100 %    Physical Exam     GENERAL:  Well developed, well-nourished female in no distress.  HEAD/NECK:    Normocephalic/atraumatic. NGT present.  CARDIOVASCULAR:   WWP, regular tate  LUNGS:   Normal work of breathing, [place]  ABDOMEN  Soft, non-distended.  EXTREMITIES:  Warm and well-perfused.  NEUROLOGIC:    The patient was alert and oriented times four with normal language, attention, cognition and memory. Cranial nerve exam was grossly normal.    MUSCULOSKELETAL:    SKIN:  No obvious rashes lesions or erythema  PSY:  Appropriate affect and mood.    Test Results    Lab Results   Component Value Date    CREATININE 0.45 (L) 11/12/2022     Lab Results   Component Value Date    ALKPHOS 164 (H) 11/12/2022    BILITOT 0.3 11/12/2022    BILIDIR 0.10 10/29/2022    PROT 6.2 11/12/2022    ALBUMIN 3.2 (L) 11/12/2022    ALT 63 (H) 11/12/2022    AST 57 (H) 11/12/2022     Problem List    Principal Problem:    Other acute postprocedural pain  Active Problems:    [name]    Intestinal polyps    [name]    Neoplasm related pain    Physical deconditioning    History of colectomy    Intractable abdominal pain    Generalized anxiety disorder with panic attacks    SBO (small bowel obstruction) (CMS-HCC)    Small intestinal bacterial overgrowth (SIBO)    Susy Manor, DO   Pain Fellow   PGY-5

## 2022-11-13 NOTE — Unmapped (Signed)
Patient is alert and oriented x4. Patient is very weak, c/o pain to lower back and hip, some relief with Oxycodone and dilaudid. Patient was asking for an additional dose of dilaudid, but she eventually fell asleep. Patient's Corpak unable to be de-clogged.   Problem: Adult Inpatient Plan of Care  Goal: Plan of Care Review  Outcome: Ongoing - Unchanged  Goal: Patient-Specific Goal (Individualized)  Outcome: Ongoing - Unchanged  Goal: Absence of Hospital-Acquired Illness or Injury  Outcome: Ongoing - Unchanged  Intervention: Identify and Manage Fall Risk  Recent Flowsheet Documentation  Taken 11/13/2022 0200 by Ruby Cola, RN BSN  Safety Interventions:   fall reduction program maintained   environmental modification   low bed   lighting adjusted for tasks/safety  Taken 11/13/2022 0000 by Ruby Cola, RN BSN  Safety Interventions: family at bedside  Taken 11/12/2022 2200 by Ruby Cola, RN BSN  Safety Interventions:   environmental modification   fall reduction program maintained  Taken 11/12/2022 2000 by Ruby Cola, RN BSN  Safety Interventions:   fall reduction program maintained   environmental modification   low bed   lighting adjusted for tasks/safety  Goal: Optimal Comfort and Wellbeing  Outcome: Ongoing - Unchanged  Goal: Readiness for Transition of Care  Outcome: Ongoing - Unchanged  Goal: Rounds/Family Conference  Outcome: Ongoing - Unchanged     Problem: Latex Allergy  Goal: Absence of Allergy Symptoms  Outcome: Ongoing - Unchanged     Problem: Fall Injury Risk  Goal: Absence of Fall and Fall-Related Injury  Outcome: Ongoing - Unchanged  Intervention: Promote Injury-Free Environment  Recent Flowsheet Documentation  Taken 11/13/2022 0200 by Ruby Cola, RN BSN  Safety Interventions:   fall reduction program maintained   environmental modification   low bed   lighting adjusted for tasks/safety  Taken 11/13/2022 0000 by Ruby Cola, RN BSN  Safety Interventions: family at bedside  Taken 11/12/2022 2200 by Ruby Cola, RN BSN  Safety Interventions:   environmental modification   fall reduction program maintained  Taken 11/12/2022 2000 by Ruby Cola, RN BSN  Safety Interventions:   fall reduction program maintained   environmental modification   low bed   lighting adjusted for tasks/safety     Problem: Skin Injury Risk Increased  Goal: Skin Health and Integrity  Outcome: Ongoing - Unchanged     Problem: Self-Care Deficit  Goal: Improved Ability to Complete Activities of Daily Living  Outcome: Ongoing - Unchanged     Problem: Comorbidity Management  Goal: Blood Glucose Levels Within Targeted Range  Outcome: Ongoing - Unchanged  Goal: Blood Pressure in Desired Range  Outcome: Ongoing - Unchanged     Problem: Nausea and Vomiting  Goal: Nausea and Vomiting Relief  Outcome: Ongoing - Unchanged     Problem: Infection  Goal: Absence of Infection Signs and Symptoms  Outcome: Ongoing - Unchanged     Problem: Pain Acute  Goal: Optimal Pain Control and Function  Outcome: Ongoing - Unchanged     Problem: Malnutrition  Goal: Improved Nutritional Intake  Outcome: Ongoing - Unchanged

## 2022-11-13 NOTE — Unmapped (Signed)
Critical Response Nurse Consult Note    Critical Response Nurse consult was ordered for Medical Equipment Troubleshooting    On Unit  8 BT    The patient???s primary language is [place].    Interpreter services were N/A    The consult was Not triggered by DI      The patient???s DI score at time of consult was 21-30    Interventions included:   request to assist with Corpak - clogged for the last 24+ hours per primary RN. Multiple attempts made by prior staff members.  No further interventions available to be done by RNs. Referred primary RN to provider given potential need for replacement.    The time frame for follow-up reassessment No follow-up is needed at this time    Primary nurse was educated to submit page at the above time frame established, but if patient exhibits any acute deviations from baseline and or have signs and symptoms of patient deterioration, the recommendation is to activate the rapid response team.    Thank you for your consult,    Danise Mina, RN

## 2022-11-14 LAB — COMPREHENSIVE METABOLIC PANEL
ALBUMIN: 3 g/dL — ABNORMAL LOW (ref 3.4–5.0)
ALKALINE PHOSPHATASE: 130 U/L — ABNORMAL HIGH (ref 46–116)
ALT (SGPT): 42 U/L (ref 10–49)
ANION GAP: 5 mmol/L (ref 5–14)
AST (SGOT): 31 U/L (ref <=34)
BILIRUBIN TOTAL: 0.2 mg/dL — ABNORMAL LOW (ref 0.3–1.2)
BLOOD UREA NITROGEN: 7 mg/dL — ABNORMAL LOW (ref 9–23)
BUN / CREAT RATIO: 16
CALCIUM: 9.1 mg/dL (ref 8.7–10.4)
CHLORIDE: 103 mmol/L (ref 98–107)
CO2: 32 mmol/L — ABNORMAL HIGH (ref 20.0–31.0)
CREATININE: 0.44 mg/dL — ABNORMAL LOW
EGFR CKD-EPI (2021) FEMALE: >90 mL/min/{1.73_m2} (ref >=60)
GLUCOSE RANDOM: 113 mg/dL (ref 70–179)
POTASSIUM: 3.7 mmol/L (ref 3.4–4.8)
PROTEIN TOTAL: 5.9 g/dL (ref 5.7–8.2)
SODIUM: 140 mmol/L (ref 135–145)

## 2022-11-14 MED ADMIN — ondansetron (ZOFRAN) injection 4 mg: 4 mg | INTRAVENOUS | @ 09:00:00

## 2022-11-14 MED ADMIN — pantoprazole (Protonix) EC tablet 40 mg: 40 mg | ORAL | @ 13:00:00

## 2022-11-14 MED ADMIN — oxyCODONE (ROXICODONE) immediate release tablet 15 mg: 15 mg | ORAL | @ 02:00:00 | Stop: 2022-11-14

## 2022-11-14 MED ADMIN — HYDROmorphone (PF) (DILAUDID) injection 0.5 mg: .5 mg | INTRAVENOUS | @ 17:00:00 | Stop: 2022-11-17

## 2022-11-14 MED ADMIN — oxyCODONE (ROXICODONE) immediate release tablet 15 mg: 15 mg | ORAL | @ 22:00:00 | Stop: 2022-11-28

## 2022-11-14 MED ADMIN — HYDROmorphone (PF) (DILAUDID) injection 0.5 mg: .5 mg | INTRAVENOUS | @ 09:00:00 | Stop: 2022-11-14

## 2022-11-14 MED ADMIN — carboxymethylcellulose sodium (REFRESH CELLUVISC) 1 % ophthalmic gel 1 drop: 1 [drp] | OPHTHALMIC | @ 10:00:00

## 2022-11-14 MED ADMIN — promethazine (PHENERGAN) 6.5 mg in sodium chloride (NS) 0.9 % 50 mL IVPB: 6.5 mg | INTRAVENOUS | @ 13:00:00

## 2022-11-14 MED ADMIN — lidocaine (ASPERCREME) 4 % 2 patch: 2 | TRANSDERMAL | @ 13:00:00

## 2022-11-14 MED ADMIN — famotidine (PEPCID) tablet 20 mg: 20 mg | ORAL

## 2022-11-14 MED ADMIN — nirogacestat (OGSIVEO) tablet 50 mg *Patient Supplied*: 50 mg | ORAL | @ 13:00:00 | Stop: 2022-11-18

## 2022-11-14 MED ADMIN — senna (SENOKOT) tablet 1 tablet: 1 | ORAL

## 2022-11-14 MED ADMIN — diclofenac sodium (VOLTAREN) 1 % gel 2 g: 2 g | TOPICAL | @ 10:00:00

## 2022-11-14 MED ADMIN — diazePAM (VALIUM) tablet 2.5 mg: 2.5 mg | ORAL | @ 13:00:00

## 2022-11-14 MED ADMIN — buprenorphine-naloxone (SUBOXONE) 2-0.5 mg SL film 0.5 mg of buprenorphine: .25 | SUBLINGUAL | @ 02:00:00

## 2022-11-14 MED ADMIN — buprenorphine-naloxone (SUBOXONE) 2-0.5 mg SL film 0.5 mg of buprenorphine: .25 | SUBLINGUAL | @ 13:00:00 | Stop: 2022-11-14

## 2022-11-14 MED ADMIN — naloxegol (MOVANTIK) 12.5 mg tablet 12.5 mg: 12.5 mg | ORAL | @ 13:00:00

## 2022-11-14 MED ADMIN — gabapentin (NEURONTIN) capsule 100 mg: 100 mg | ORAL | @ 13:00:00

## 2022-11-14 MED ADMIN — gabapentin (NEURONTIN) capsule 100 mg: 100 mg | ORAL | @ 18:00:00

## 2022-11-14 MED ADMIN — HYDROmorphone (PF) (DILAUDID) injection 0.5 mg: .5 mg | INTRAVENOUS | Stop: 2022-11-17

## 2022-11-14 MED ADMIN — oxyCODONE (ROXICODONE) immediate release tablet 15 mg: 15 mg | ORAL | @ 14:00:00 | Stop: 2022-11-28

## 2022-11-14 MED ADMIN — diazePAM (VALIUM) tablet 2.5 mg: 2.5 mg | ORAL | @ 18:00:00

## 2022-11-14 MED ADMIN — promethazine (PHENERGAN) 6.5 mg in sodium chloride (NS) 0.9 % 50 mL IVPB: 6.5 mg | INTRAVENOUS | @ 03:00:00

## 2022-11-14 MED ADMIN — nirogacestat (OGSIVEO) tablet 50 mg *Patient Supplied*: 50 mg | ORAL | Stop: 2022-11-18

## 2022-11-14 MED ADMIN — pantoprazole (Protonix) EC tablet 40 mg: 40 mg | ORAL

## 2022-11-14 MED ADMIN — ondansetron (ZOFRAN) injection 4 mg: 4 mg | INTRAVENOUS

## 2022-11-14 MED ADMIN — multivitamin with folic acid 400 mcg tablet 1 tablet: 1 | ORAL | @ 13:00:00

## 2022-11-14 MED ADMIN — diazePAM (VALIUM) tablet 5 mg: 5 mg | ORAL | @ 02:00:00

## 2022-11-14 MED ADMIN — HYDROmorphone (PF) (DILAUDID) injection 0.5 mg: .5 mg | INTRAVENOUS | @ 04:00:00 | Stop: 2022-11-14

## 2022-11-14 MED ADMIN — gabapentin (NEURONTIN) capsule 100 mg: 100 mg | ORAL

## 2022-11-14 MED ADMIN — diclofenac sodium (VOLTAREN) 1 % gel 2 g: 2 g | TOPICAL | @ 02:00:00

## 2022-11-14 MED ADMIN — HYDROmorphone (PF) (DILAUDID) injection 0.5 mg: .5 mg | INTRAVENOUS | Stop: 2022-11-14

## 2022-11-14 MED ADMIN — carboxymethylcellulose sodium (REFRESH CELLUVISC) 1 % ophthalmic gel 1 drop: 1 [drp] | OPHTHALMIC | @ 02:00:00

## 2022-11-14 MED ADMIN — buprenorphine-naloxone (SUBOXONE) 2-0.5 mg SL film 1 mg of buprenorphine: .5 | SUBLINGUAL | @ 18:00:00

## 2022-11-14 NOTE — Unmapped (Signed)
A/O x 4, afebrile, VSS on 2L [place], PRN meds given for pain and nausea (see MAR). Triple lumen IJ clean, dry, intact, and flushed per protocol, refused dressing change. NG tube intact. Mom at bedside. Falls precautions maintained. Call bell and bedside table within reach, bed in lowest position and locked. No falls or injuries noted this shift. Will continue with plan of care.        Problem: Adult Inpatient Plan of Care  Goal: Plan of Care Review  Outcome: Progressing  Goal: Patient-Specific Goal (Individualized)  Outcome: Progressing  Flowsheets (Taken 11/14/2022 0945)  Patient/Family-Specific Goals (Include Timeframe): Free from falls or injuries through discharge  Individualized Care Needs: Falls, labs, VS, NG tube  Anxieties, Fears or Concerns: pain, nausea  Goal: Absence of Hospital-Acquired Illness or Injury  Outcome: Progressing  Intervention: Prevent and Manage VTE (Venous Thromboembolism) Risk  Recent Flowsheet Documentation  Taken 11/14/2022 0800 by Doree Barthel, RN  Anti-Embolism Intervention: Other (Comment)  Taken 11/14/2022 0704 by Doree Barthel, RN  Anti-Embolism Intervention: Other (Comment)  Goal: Optimal Comfort and Wellbeing  Outcome: Progressing  Goal: Readiness for Transition of Care  Outcome: Progressing  Goal: Rounds/Family Conference  Outcome: Progressing     Problem: Latex Allergy  Goal: Absence of Allergy Symptoms  Outcome: Progressing     Problem: Fall Injury Risk  Goal: Absence of Fall and Fall-Related Injury  Outcome: Progressing     Problem: Skin Injury Risk Increased  Goal: Skin Health and Integrity  Outcome: Progressing  Intervention: Optimize Skin Protection  Recent Flowsheet Documentation  Taken 11/14/2022 0704 by Doree Barthel, RN  Head of Bed [facility]) Positioning: HOB at 30 degrees     Problem: Nausea and Vomiting  Goal: Nausea and Vomiting Relief  Outcome: Progressing     Problem: Pain Acute  Goal: Optimal Pain Control and Function  Outcome: Progressing

## 2022-11-14 NOTE — Unmapped (Signed)
A&O x4, 2L/[place], NG left nare w/TF infusing, up w/assist to bathroom x1, ongoing abd pain, nausea, meds see MAR for details. Continues c/o pain lower back; repositioned for comfort. Doppler study performed travel via stretcher. Care, comfort, and safety maintained. Bed in lowest position and locked w/side rail up x2. Bedside table and call light within reach.     Problem: Adult Inpatient Plan of Care  Goal: Plan of Care Review  Outcome: Ongoing - Unchanged  Flowsheets (Taken 11/14/2022 [phone])  Progress: improving  Plan of Care Reviewed With: patient  Goal: Patient-Specific Goal (Individualized)  Outcome: Ongoing - Unchanged  Goal: Absence of Hospital-Acquired Illness or Injury  Outcome: Ongoing - Unchanged  Goal: Optimal Comfort and Wellbeing  Outcome: Ongoing - Unchanged  Goal: Readiness for Transition of Care  Outcome: Ongoing - Unchanged  Goal: Rounds/Family Conference  Outcome: Ongoing - Unchanged     Problem: Latex Allergy  Goal: Absence of Allergy Symptoms  Outcome: Ongoing - Unchanged     Problem: Fall Injury Risk  Goal: Absence of Fall and Fall-Related Injury  Outcome: Ongoing - Unchanged     Problem: Skin Injury Risk Increased  Goal: Skin Health and Integrity  Outcome: Ongoing - Unchanged     Problem: Self-Care Deficit  Goal: Improved Ability to Complete Activities of Daily Living  Outcome: Ongoing - Unchanged     Problem: Comorbidity Management  Goal: Blood Glucose Levels Within Targeted Range  Outcome: Ongoing - Unchanged  Goal: Blood Pressure in Desired Range  Outcome: Ongoing - Unchanged     Problem: Nausea and Vomiting  Goal: Nausea and Vomiting Relief  Outcome: Ongoing - Unchanged     Problem: Infection  Goal: Absence of Infection Signs and Symptoms  Outcome: Ongoing - Unchanged     Problem: Pain Acute  Goal: Optimal Pain Control and Function  Outcome: Ongoing - Unchanged     Problem: Malnutrition  Goal: Improved Nutritional Intake  Outcome: Ongoing - Unchanged

## 2022-11-14 NOTE — Unmapped (Signed)
Adult Nutrition Progress Note    Visit Type: Follow-Up  Reason for Visit: Enteral Nutrition, PO Intake, Supplements (Nutrition)    See previous RD note from today for recommendations and assessment.  Recommendations:      Recommend reduce tube feed cycle to 13 hours: Recommend Osmolite 1.5 Cal at goal rate 40 mL/hr cycled over 13 hrs. This provides 780 kcals, 33 g protein, 106 g carbohydrate, 26 g fat, 0 g fiber, 397 mL free water  This would meet 50% of needs  Patient to consume 375 kcal to meet 25%- total 75% of needs  Recommend minimum 30 ml Q4H FWF  Discontinue Ensure shakes- patient's mother will provide patient with fairlife protein shakes as patient prefers these  Continue multivitamin as volume of tube feeds is insufficient to meet DRIs     Follow-Up Parameters:   1-2 times per week (and more frequent as indicated)    Alesia Morin, RD, LDN

## 2022-11-14 NOTE — Unmapped (Signed)
[facility]  Follow-Up Psychiatry Consult Note      Date of admission: 09/25/2022 10:30 AM  Service Date: November 14, 2022  Primary Team: [facility])  LOS:  LOS: 48 days      Assessment:   [name] [name] is a 22 y.o. 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 and reported past psych history of GAD and MDD, admitted 09/25/2022 10:30 AM for other acute postprocedural pain following injection of desmoid tumor with steroid and anesthetic under ultrasound guidance.  Patient was seen in consultation by request of hospitalist on [facility] for evaluation of anxiety and depression.      [name] [name] presents with symptoms consistent with a diagnosis of GAD and MDD, consistent with her historical diagnoses. Symptoms have worsened in the context of prolonged medical hospitalization, with increased anxiety/panic attacks.     Today, patient with cautious optimism regarding strength and AIR but concern that things may be moving too fast. Would appreciate continued involvement in discussions re: dispo. Otherwise, no changes indicated for current psychotropic regimen.     Diagnoses:   Active Hospital problems:  Principal Problem:    Other acute postprocedural pain  Active Problems:    [name]    Intestinal polyps    [name]    Neoplasm related pain    Physical deconditioning    History of colectomy    Intractable abdominal pain    Generalized anxiety disorder with panic attacks    SBO (small bowel obstruction) (CMS-HCC)    Small intestinal bacterial overgrowth (SIBO)       Problems edited/added by me:  No problems updated.    Risk Assessment:  ASQ screening result: not completed    -A full risk assessment was previously performed on 7/31.  Risk assessment remains essentially unchanged.    Current suicide risk: low risk  Current homicide risk: low risk      Recommendations:     Safety and Observation Level:   -- This patient is not currently under IVC. If safety concerns arise, please page psychiatry for an evaluation.    Medications:  -- continue diazepam 2.5mg  qAM + 2.5mg  qafternoon + 5mg  qHS for anxiety, muscle spasms  - continue mirtazapine 15mg  qHS for mood and anxiety    Further Work-up:   -- No further recommendations at this time from a psychiatric standpoint    Behavioral / Environmental:   -- Utilize compassion and acknowledge the patient's experiences while setting clear and realistic expectations for care.     Follow-up:  -- When patient is discharged, please ensure that their AVS includes information about the 35 Suicide & Crisis Lifeline.  -- The patient currently receives mental health care with [facility] and we will attempt to coordinate followup with our psychiatry team on discharge. She is also recently established with a virtual DBT group at [facility] ([employer] L[employer]) although it is unclear what the status of this group is   -- We will follow as needed at this time.     Thank you for this consult request. Recommendations have been communicated to the primary team. Please page [phone]  for any questions or concerns.     Discussed with Attending, Olga Coaster, MD, who agrees with the assessment and plan.    [name], MD      Subjective     Relevant events since last seen by psychiatry: patient had tube  replaced yesterday    Patient Interview:  Patient seen with mother at bedside. Processed events of last few days. Patient reports interest in going to [facility] rehab but is also concerned she may not be ready for it. She would like to involved in the discussions regarding dispo. Patient otherwise reports overall continuing to build her strength and being able to continue working with PT. She has been looking into [place] herself.     ROS:   All systems reviewed as negative/unremarkable aside from the following pertinent positives and negatives: anxiety, pain    Collateral:   - Reviewed medical records in Epic    Relevant Updates to past psychiatric, medical/surgical, family, or social history: n/a    Current Medications:  Scheduled Meds:   buprenorphine-naloxone  0.5 Film Sublingual TID    carboxymethylcellulose sodium  1 drop Both Eyes QID    diazePAM  2.5 mg Oral Daily    And    diazePAM  2.5 mg Oral Daily    diazePAM  5 mg Oral Nightly    diclofenac sodium  2 g Topical QID    famotidine  20 mg Oral Nightly    gabapentin  100 mg Oral TID    lidocaine  2 patch Transdermal Daily    mirtazapine  15 mg Enteral tube: gastric Nightly    multivitamins (ADULT)  1 tablet Oral Daily    naloxegol  12.5 mg Oral Daily    nirogacestat  50 mg Oral BID    pantoprazole  40 mg Oral BID     Continuous Infusions:   Chemo Clarification Order      sodium chloride 10 mL/hr (11/09/22 0314)     PRN Meds:.acetaminophen, alum-mag-simeth, baclofen, bisacodyl, carboxymethylcellulose sodium, Chemo Clarification Order, diazePAM, guaiFENesin, hydrocortisone, HYDROmorphone, lidocaine-diphenhydrAMINE-aluminum-magnesium, ondansetron, oxyCODONE **OR** oxyCODONE, phenol, polyethylene glycol, promethazine, pseudoephedrine, senna, zinc oxide-cod liver oil      Objective:   Vital signs:   Temp:  [36.5 ??C (97.7 ??F)-36.9 ??C (98.4 ??F)] 36.9 ??C (98.4 ??F)  Heart Rate:  [86-145] 108  Resp:  [16-18] 18  BP: (108-119)/(52-71) 119/71  MAP (mmHg):  [69-86] 86  SpO2:  [98 %-100 %] 98 %    Physical Exam:  Gen: No acute distress.    Mental Status Exam:  Appearance:  appears stated age and in bed   Attitude:   calm, cooperative   Behavior/Psychomotor:  appropriate eye contact and no abnormal movements   Speech/Language:   normal rate, not pressured, normal volume, normal fluency. normal articulation   Mood:  ???ok???   Affect:  mood congruent and euthymic   Thought process:  logical, linear, clear, coherent, goal directed   Thought content:    denies thoughts of self-harm. Denies SI, plans, or intent. Denies HI.  No grandiose, self-referential, persecutory, or paranoid delusions noted.   Perceptual disturbances:   denies auditory and visual hallucinations   Attention:  able to attend to interview without fluctuations in consciousness   Concentration:  Able to fully concentrate and attend   Orientation:  grossly oriented.   Memory:  not formally tested, but grossly intact   Fund of knowledge:   not formally assessed   Insight:    Intact   Judgment:   Intact   Impulse Control:  Intact     Relevant laboratory/imaging data was reviewed.    Additional Psychometric Testing:  Not applicable.    Consult Type and Time-Based Documentation:  This patient was evaluated in person.    Time-based billing disclaimer:  I personally spent 30   minutes face-to-face and non-face-to-face in the care of this patient, which includes all pre, intra, and post visit time on the date of service.  All documented time was specific to the E/M visit and does not include any procedures that may have been performed.

## 2022-11-14 NOTE — Unmapped (Signed)
Hospital Medicine Progress Note    Assessment/Plan:    Principal Problem:    Other acute postprocedural pain  Active Problems:    [name]    Intestinal polyps    [name]    Neoplasm related pain    Physical deconditioning    History of colectomy    Intractable abdominal pain    Generalized anxiety disorder with panic attacks    SBO (small bowel obstruction) (CMS-HCC)    Small intestinal bacterial overgrowth (SIBO)      [name] [name] is a 22 y.o. y/o female that presents to [facility] with Other acute postprocedural pain.    Acute on chronic pain/acute back pain secondary to MSK injury/abdominal desmoid fibromatosis:  Pain continues to be limiting, and in multiple places including low back since strenous exam last week, legs, abdomen. No obvious reversible triggers, suspect relates primarily to her recent muscular issues and Gardner's syndrome.  Continue to take hydromorphone 5-6x daily most days, with some use of PO oxycodone.  As discussed with patient and Pain team, this will be limiting for transition and would prefer to maximize oral regimen to decrease IV need as we are further away from the triggering events.  Will increase Suboxone today, continue oral oxycodone as needed and decrease frequency of hydromorphone from 0.5 mg every 4 hours to every 6 hours.  Appreciate Chronic Pain evaluation continued assistant, and they will follow this weekend. Plan continue to be to improve patient's comfort and mobility to get patient to AIR.   -Anesthesia pain consult following  -Increase Suboxone to 1/0.25 mg tid  -Decrease hydromorphone IV to 0.5 mg every 6 hours if needed  -Continue to encourage use of oxycodone 10 to 15 mg every 4 hours as needed over IV  -Continue lidocaine patches  -Continue gabapentin    Muscle spasm and stiffness/difficulty with ambulation:  Initial presenting complaint.  Has improved from admission with start of diazepam but continues to have some symptoms. Have tapered and changed baclofen to prn without change in symptoms.  She continues to note difficulty with gait, especially with knees and feeling of buckling. Diagnosis remains unclear.  Workup included MRI of spine and brain, EMG and autoimmune myopathy panel all negative. Paraneoplastic antibody panel notable for a slightly elevated PQ type calcium channel antibody of unclear significance.  Neurology and PM&R do not feel this is consistent with stiff man syndrome especially in light of EMG findings. Plan to continue treatment with diazepam with baclofen if needed and aggressive PT/OT.  -Discussed yesterday with PM&R who plan to reassess gait for need for additional support with therapy.   -Continue baclofen 10 mg 3 times daily as needed for spasms, can restart if patient notes worsening symptoms  -Continue diazepam at current doses  -Continue gabapentin  -PT/OT recommending 5 times high intensity,, working towards AIR    Nausea/malnutrition/lieus:  Replaced ND tube functioning well.  Does feel she is having improved intake and would be interested in lowering dose of TF to try to improve appetite. Having regular BMs and ileus appears to be well managed. RD to reevaluate for cyclic feeding. Previously has required TPN.    -Nutrition consult for cyclic regimen  -Continue bowel regimen  -Continue naloxegol    [name]/desmoid fibromatosis:  Follows with Dr. [name] of [facility] oncology.  Had recent triamcinolone and bupivacaine injection of the RUE and left chest tumor by IR on 7/18.  Takes nirogacestat 150 mg twice daily but this has been held intermittently due  to other medical reasons.  Plan was to start 50 mg twice daily and titrate up by 50 mg each week until back at usual dose.  LFTs have been mildly elevated but overall improved and without a clear etiology.  -Continue nirogacestat at 50 mg twice daily (started 9/3, will increase dose 9/10)  -Titrate up by week to usual 150 mg bid     LFT elevation:  Has had intermittent elevations of ALT, AST and alkaline phosphatase over the hospitalization.  Has continued to improve, without a clear etiology.  Did previously receive TPN, cefazolin and fluconazole which all could have contributed.  Had a liver ultrasound on 8/15 negative.  Now back on her usual [name] treatment.  -Continue to monitor    Generalized anxiety disorder/depression:  Previously on lorazepam which has been changed to diazepam after discussion with psychiatry for stiffness. Psychiatry has been following and recommends no changes today on my discussion.  -Continue diazepam at current dosing  -Continue mirtazapine    Right>left leg swelling:  Noted mild non pitting edema of area below right knee and foot on 9/5.  Knee exam without effusion, no laxity.  XR with normal alignment. PVL negative. Stable today.  -Continue to monitor    Enterobacteriaceae GNR/Candida infection related to tunneled line:  Line now removed.  Repeat cultures negative.  Underwent TEE x 2 without evidence of vegetations.  Optho evaluation on 8/15 without evidence of infection.  Completed treatment with cephalosporin and fluconazole on 8/31.  -No further treatment at this time    Iron deficiency anemia:  Received 1 g ferumoxytol during this hospitalization.  Of note, has a history of Fe hypersensitivity reactions and evaluated by [facility].  Hb and MCV  Received cetirizine for allergic symptoms but now stopped.   -No further treatment    FEN: Regular Diet  PPx:  Patient declines chemical prophylaxis due to bleeding risk.  Disposition: Inpatient, working towards AIR discharge  Code: FULL     Donavan Foil, MD  Please page [facility] [phone] with questions.      I personally spent 50 minutes face-to-face and non-face-to-face in the care of this patient, which includes all pre, intra, and post visit time on the date of service.  All documented time was specific to the E/M visit and does not include any procedures that may have been performed.    ___________________________________________________________________    Subjective:    Tolerating TF and having regular BMs, continues to have improved output from previous.  Gait seems improved with PT yesterday (did receive IV diazepam for feeding tube placement before).  Was limited by high HR episodes for which she takes seated breaks.  Does not note clear change in stiffness with stopping baclofen and did not take any doses yesterday.  Pain continues to be an issue, in lower back, legs and abdomen.  No vomiting.      Objective:    Vital signs in last 24 hours:  Temp:  [36.5 ??C (97.7 ??F)-36.9 ??C (98.4 ??F)] 36.9 ??C (98.4 ??F)  Heart Rate:  [86-145] 108  Resp:  [16-18] 18  BP: (108-119)/(52-71) 119/71  MAP (mmHg):  [69-86] 86  SpO2:  [98 %-100 %] 98 %    Intake/Output last 3 shifts:  I/O last 3 completed shifts:  In: 0   Out: 1125 [Urine:1125]    Physical Exam:  GEN: In bed, occasionally anxious  EYES: Anicteric  ENT: OP moist, NG in place  CV:  RRR, normal S1, S2, no murmur  PULM:  Moderate effort, no crackles or wheezes  ABD: Continued tenderness to palpation without rebound or guard, good bowel sound  Extremities: stable stiffness in BLE.  Bilateral knees without effusions. Trace edema in right shin and foot    Medications:  Scheduled Meds:   buprenorphine-naloxone  0.5 Film Sublingual TID    carboxymethylcellulose sodium  1 drop Both Eyes QID    diazePAM  2.5 mg Oral Daily    And    diazePAM  2.5 mg Oral Daily    diazePAM  5 mg Oral Nightly    diclofenac sodium  2 g Topical QID    famotidine  20 mg Oral Nightly    gabapentin  100 mg Oral TID    lidocaine  2 patch Transdermal Daily    mirtazapine  15 mg Enteral tube: gastric Nightly    multivitamins (ADULT)  1 tablet Oral Daily    naloxegol  12.5 mg Oral Daily    nirogacestat  50 mg Oral BID    pantoprazole  40 mg Oral BID     Continuous Infusions:   Chemo Clarification Order      sodium chloride 10 mL/hr (11/09/22 0314)     PRN Meds:.acetaminophen, alum-mag-simeth, baclofen, bisacodyl, carboxymethylcellulose sodium, Chemo Clarification Order, diazePAM, guaiFENesin, hydrocortisone, HYDROmorphone, lidocaine-diphenhydrAMINE-aluminum-magnesium, ondansetron, oxyCODONE **OR** oxyCODONE, phenol, polyethylene glycol, promethazine, pseudoephedrine, senna, zinc oxide-cod liver oil    Labs/Studies:  Labs/Studies reviewed by me.  Notable for values below.    Recent Labs     Units 11/08/22  1039 11/11/22  0620 11/13/22  1039   WBC 10*9/L 5.1 5.2 5.5   RBC 10*12/L 3.50* 3.49* 3.55*   HGB g/dL 9.6* 9.8* 16.1*   HCT % 29.0* 29.0* 29.5*   MCV fL 82.8 82.9 83.0   MCH pg 27.5 28.0 28.2   MCHC g/dL 09.6 04.5 40.9   RDW % 17.0* 17.1* 17.3*   PLT 10*9/L 166 155 157   MPV fL 10.6 10.3 10.3       Recent Labs     Units 11/12/22  0547 11/13/22  1039 11/14/22  0536   NA mmol/L 141 141 140   K mmol/L 3.9 3.9 3.7   CL mmol/L 102 103 103   CO2 mmol/L 34.0* 33.0* 32.0*   BUN mg/dL 9 8* 7*   CREATININE mg/dL 8.11* 9.14* 7.82*   GLU mg/dL 92 86 956       Recent Labs     Units 11/14/22  0536   ALBUMIN g/dL 3.0*   ALT U/L 42   AST U/L 31   ALKPHOS U/L 130*   BILITOT mg/dL 0.2*   PROT g/dL 5.9

## 2022-11-14 NOTE — Unmapped (Signed)
Department of Anesthesiology  Pain Medicine Division    Chronic Pain Followup Inpatient Consult Note    Requesting Attending Physician:  Earna Coder, MD  Service Requesting Consult:  Med Bernita Raisin [facility])    Assessment/Recommendations:  The patient was seen in consultation on request of Rocco Serene, MD regarding assistance with pain management for patient's chronic intractable abdominal pain with now acute on chronic recurrent ileus/NG tube placement.  Primary team requesting consideration for ketamine infusion or lidocaine infusion, to reduce narcotic usage due to concern for narcotic bowel syndrome and now has an ileus that has resolved but course has been complicated by a line infection and she has completed IV abx for.      Patient currently established with [facility] pain management center Dr. Dyann Ruddle, has been on oxycodone 10 mg 3 times daily, Suboxone 0.5 mg 3 times daily along with baclofen and pregabalin. Overall goal is to wean to buprenorphine and non-opioid medications and eventually off of buprenorphine as well. She is no longer on Ativan but taking Valium per psych recommendations.     Patient has had multiple prior inpatient stays either regarding acute exacerbation of chronic pain or ileus.  In the past 12 months she has had 2 infusions of ketamine with limited benefit (last infusion in August 2024) and at one point there was some transaminitis which was thought to be attributed from other etiologies like TPN versus other intra-abdominal pathologies.  In the past patient also has had lidocaine infusions during inpatient stay with limited/variable benefit.           This is no doubt a very complex situation. Currently her new goal is to get to acute inpatient rehab at [facility]. She understands the barriers of her IV pain medications, her activity tolerance and other medical barriers. She is currently trying to work more with therapy and working towards tolerating 3 hours. She has been able to tolerate therapy well and ambulating with breaks. She does require the tele during therapy as she will pass out if heart rate gets too high.       Interval Events: This morning her mood was much better. She worked with therapy yesterday and felt good but had pain and soreness afterward. Today we discussed trying to decrease frequency of the IV dilaudid to q6h today and increase suboxone a bit and monitor through the weekend as this will help get her to rehab sooner. She was in agreement with this plan.       Vital Sign Trends: Vitally stable, afebrile.    Current Hospital Analgesic Medications:  Baclofen 10 mg TID  Suboxone 1-0.5 mg SL TID (0.25 film)   Valium 2.5mg  Am and 5mg  nightly   Gabapentin 100mg  TID   APAP 650 mg q6 PRN  Oxycodone 10mg  or 15mg  q4h PRN (has been taking the 15mg  around 3-4 times a day)   IV dilaudid 0.5mg  q4h PRN ( has been taking every 4-5 hours)    Recommendations:  -The chronic pain service is a consult service and does not place orders, just makes recommendations (except ketamine and lidocaine infusions)   -Please evaluate all patients on opioids for appropriateness of prescribing narcan at discharge.  The chronic pain service can assist with this.  Nasal narcan is covered by most insurances.  -Recommendations given apply to the current hospitalization and do not reflect long term recommendations.    - Continue Baclofen,valium, gabapentin, oxycodone as above   - Continue PRN acetaminophen  -  increase Suboxone 2-0.5, to 1/2 film   - decrease IV dilaudid 0.5mg  from q4h to q6h PRN   - Recommend continued physical therapy,  please try to see daily or give family/nursing staff exercises to complete   - Recommend involvement of inpatient psychology if possible and complex care given patient's distress surrounding hospitalization  - Patient would likely benefit from pain psychology evaluation post-discharge (in [facility])    We will continue to follow.    Naloxone Rx at discharge?  Is patient on opioids? Yes.  1)Is dose >50MME?  Yes.  2) Is patient prescribed a benzodiazepine (w opioids)? Yes.  3)Hx of overdose?  No.  4) Hx of substance use disorder? No.  5) Opioids likely to last greater than a week after discharge? Yes.     If yes to 2 or more, prescribe naloxone at discharge.  Nasal narcan for most insured (Nasal narcan 4mg /actuation, prescribe 1 kit, instructions at [email or web address]).  For uninsured, chronic pain can work to assist in finding an option.  OTC nasal narcan now available at most pharmacies for around $45.    Interim History  There were no Acute Events Overnight.   The patient is not obtaining adequate pain relief on current medication regimen and feels that their pain is Somewhat controlled    Inpatient Medications  Current Facility-Administered Medications   Medication Dose Route Frequency Provider Last Rate Last Admin    acetaminophen (TYLENOL) tablet 650 mg  650 mg Enteral tube: gastric Q6H PRN Anell Barr, MD   650 mg at 10/22/22 2123    aluminum-magnesium hydroxide-simethicone (MAALOX PLUS) 200-200-20 mg/5 mL suspension 60 mL  60 mL Oral Q6H PRN Anell Barr, MD        baclofen (LIORESAL) tablet 10 mg  10 mg Oral TID PRN Earna Coder, MD        bisacodyl (DULCOLAX) suppository 10 mg  10 mg Rectal BID PRN Anell Barr, MD        buprenorphine-naloxone (SUBOXONE) 2-0.5 mg SL film 1 mg of buprenorphine  0.5 Film Sublingual TID Hemsey, Huntley Estelle, MD        carboxymethylcellulose sodium (REFRESH CELLUVISC) 1 % ophthalmic gel 1 drop  1 drop Both Eyes QID Dacillo-Curso, Amy, ACNP   1 drop at 11/14/22 0533    carboxymethylcellulose sodium (REFRESH CELLUVISC) 1 % ophthalmic gel 2 drop  2 drop Both Eyes QID PRN Dacillo-Curso, Amy, ACNP   2 drop at 11/01/22 1655    CHEMO CLARIFICATION ORDER   Other Continuous PRN Dacillo-Curso, Amy, ACNP        diazePAM (VALIUM) tablet 2.5 mg  2.5 mg Oral Daily Kateri Plummer, MD   2.5 mg at 11/14/22 9629    And    diazePAM (VALIUM) tablet 2.5 mg  2.5 mg Oral Daily Kateri Plummer, MD   2.5 mg at 11/13/22 1447    diazePAM (VALIUM) tablet 2.5 mg  2.5 mg Oral Nightly PRN Kateri Plummer, MD        diazePAM (VALIUM) tablet 5 mg  5 mg Oral Nightly Kateri Plummer, MD   5 mg at 11/13/22 2147    diclofenac sodium (VOLTAREN) 1 % gel 2 g  2 g Topical QID Dacillo-Curso, Amy, ACNP   2 g at 11/14/22 0533    famotidine (PEPCID) tablet 20 mg  20 mg Oral Nightly Anell Barr, MD   20 mg at 11/13/22 2011    gabapentin (NEURONTIN) capsule 100  mg  100 mg Oral TID Dacillo-Curso, Amy, ACNP   100 mg at 11/14/22 0857    guaiFENesin (ROBITUSSIN) oral syrup  200 mg Oral Q4H PRN Anell Barr, MD   200 mg at 10/17/22 2344    hydrocortisone 2.5 % cream   Topical BID PRN Dacillo-Curso, Amy, ACNP   Given at 11/11/22 2223    HYDROmorphone (PF) (DILAUDID) injection 0.5 mg  0.5 mg Intravenous Q6H PRN Hemsey, Huntley Estelle, MD        lidocaine (ASPERCREME) 4 % 2 patch  2 patch Transdermal Daily Kateri Plummer, MD   2 patch at 11/14/22 9604    mirtazapine (REMERON) tablet 15 mg  15 mg Enteral tube: gastric Nightly Anell Barr, MD   15 mg at 11/12/22 2203    mucositis mixture (with lidocaine)  10 mL Mouth QID PRN Dacillo-Curso, Amy, ACNP   10 mL at 10/26/22 2146    multivitamin with folic acid 400 mcg tablet 1 tablet  1 tablet Oral Daily Anell Barr, MD   1 tablet at 11/14/22 0857    naloxegol (MOVANTIK) 12.5 mg tablet 12.5 mg  12.5 mg Oral Daily Anell Barr, MD   12.5 mg at 11/14/22 0902    nirogacestat (OGSIVEO) tablet 50 mg *Patient Supplied*  50 mg Oral BID Earna Coder, MD   50 mg at 11/14/22 0904    ondansetron (ZOFRAN) injection 4 mg  4 mg Intravenous Q12H PRN Dacillo-Curso, Amy, ACNP   4 mg at 11/14/22 5409    oxyCODONE (ROXICODONE) immediate release tablet 10 mg  10 mg Oral Q4H PRN Hemsey, Huntley Estelle, MD        Or    oxyCODONE (ROXICODONE) immediate release tablet 15 mg  15 mg Oral Q4H PRN Hemsey, Huntley Estelle, MD        pantoprazole (Protonix) EC tablet 40 mg  40 mg Oral BID Anell Barr, MD   40 mg at 11/14/22 0857    phenol (CHLORASEPTIC) 1.4 % spray 2 spray  2 spray Mucous Membrane Q2H PRN Dacillo-Curso, Amy, ACNP   2 spray at 10/03/22 0138    polyethylene glycol (MIRALAX) packet 17 g  17 g Oral BID PRN Anell Barr, MD   17 g at 11/10/22 2132    promethazine (PHENERGAN) 6.5 mg in sodium chloride (NS) 0.9 % 50 mL IVPB  6.5 mg Intravenous Q6H PRN Dacillo-Curso, Amy, ACNP 171 mL/hr at 11/14/22 0908 6.5 mg at 11/14/22 0908    pseudoephedrine (SUDAFED) tablet 30 mg  30 mg Oral Daily PRN Anell Barr, MD        senna [facility]) tablet 1 tablet  1 tablet Oral BID PRN Anell Barr, MD   1 tablet at 11/13/22 2011    sodium chloride (NS) 0.9 % infusion  10 mL/hr Intravenous Continuous Dacillo-Curso, Amy, ACNP 10 mL/hr at 11/09/22 0314 10 mL/hr at 11/09/22 0314    zinc oxide-cod liver oil (DESITIN 40%) Paste   Topical Daily PRN Dacillo-Curso, Amy, ACNP   Given at 11/11/22 2223     Objective:     Vital Signs    Temp:  [36.5 ??C (97.7 ??F)-36.7 ??C (98.1 ??F)] 36.6 ??C (97.9 ??F)  Heart Rate:  [86-108] 86  Resp:  [16-18] 16  BP: (108-110)/(62-64) 108/64  MAP (mmHg):  [76-77] 76  SpO2:  [100 %] 100 %    Physical Exam     GENERAL:  Well developed, well-nourished female in no distress.  HEAD/NECK:  Normocephalic/atraumatic. NGT present.  CARDIOVASCULAR:   WWP, regular tate  LUNGS:   Normal work of breathing, [place]  ABDOMEN  Soft, non-distended.  EXTREMITIES:  Warm and well-perfused.  NEUROLOGIC:    The patient was alert and oriented times four with normal language, attention, cognition and memory. Cranial nerve exam was grossly normal.    MUSCULOSKELETAL:    SKIN:  No obvious rashes lesions or erythema  PSY:  Appropriate affect and mood.    Test Results    Lab Results   Component Value Date    CREATININE 0.44 (L) 11/14/2022     Lab Results   Component Value Date    ALKPHOS 130 (H) 11/14/2022    BILITOT 0.2 (L) 11/14/2022    BILIDIR 0.10 10/29/2022    PROT 5.9 11/14/2022    ALBUMIN 3.0 (L) 11/14/2022    ALT 42 11/14/2022    AST 31 11/14/2022     Problem List    Principal Problem:    Other acute postprocedural pain  Active Problems:    [name]    Intestinal polyps    [name]    Neoplasm related pain    Physical deconditioning    History of colectomy    Intractable abdominal pain    Generalized anxiety disorder with panic attacks    SBO (small bowel obstruction) (CMS-HCC)    Small intestinal bacterial overgrowth (SIBO)    [place]y [place], DO   Pain Fellow   PGY-5

## 2022-11-14 NOTE — Unmapped (Signed)
Physical Medicine and Rehab  Inpatient Consult Note    Requesting Attending Physician: Earna Coder, MD  Service Requesting Consult: Med [facility])    Thank you for this consult.  Please contact the PM&R consult pager [phone]) for questions regarding these recommendations.  For questions of bed availability at [facility], contact the Intake Office at [phone]. Please contact your case manager for updates on AIR process as they are in regular contact with the rehab intake office and should have the latest information.    *This PM&R consult does not guarantee that patient has been accepted to [facility] AIR, 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 Patient is currently hospitalized since 09/25/2022. The patient is seen in consultation for evaluation of rehabilitation needs.    Checklist for Potential AIR:   [ ]  EDD/what is estimated discharge date?  [ ]  Referral from CM if wants [facility] AIR  [ ]  PT/OT within 48 hours of AIR  [ ]  dispo verified and  AIR preference  [ ]  CBC/BMP & other pertinent labs within 48 hours of AIR  [ ]  Prefer oral pain meds only, but if on IV pain meds, then no more than q 6-8 hours prn  [ ]  6 minute walk test completed for home oxygen requirement  [ ]  If on oxygen, no more than 4 LPM Dasher  [ ]  Chemotherapy plan documented  [ ]  Currently needing TF via NGT.  May need to discharge to home with NGT. Feeding plan ongoing     Dispo: home with family. She previously was independent with basic ADLs. Has MSK limitations which required some assistance. She is in RN school    Functional Impairments and Recommendations  - Please continue to have patient work with PT, OT, and SLP to maximize functional status with mobility, ADLs, and cogniton/swallow function.  - Will start insurance authorization if demonstrated improvement with physical therapy and documented chemotherapy plan  - Fall risk precautions  - Recommend OOB to chair daily with assistance if appropriate    Principal Medical Problem:  Other acute postprocedural pain    Increased Tonicity  Dystonia   Started post-procedurally per patient ~09/25/22. Occurs intermittently throughout the day with a heightened occurrence during mobilization. At rest patient bilateral ankles in plantarflexion with worsening heel cord tightening. Has tonicity most prominent to HF and KE that is not velocity dependent. Has not noted significant improvement with increase of baclofen to 15 mg TID in setting of infection as below. Differential wise, Neurology previously evaluated patient seemingly ruling out an acute dystonic reaction, cord compression, central process, nutritional deficiency, stiff person syndrome and [name] after normal EMG on 8/22.   - Continue Baclofen 10 mg TID  - Continued Valium 2.5/2.5/5 mg TID and 2.5 mg nightly PRN  - At risk for contractures, Recommend ordering bilateral hard PRAFO boots  - Recommend OOB to chair daily with assistance if appropriate     Headache  Currently managed with tylenol. She does have some posterior neck muscle tightness. Notes ketamine is helpful for headache pains. Describes photophobia and phonophobia with events. I discussed with patient our  hope is to decrease risk of rebound headaches and limit medical issues which will interfere with therapy. An additional thought is patient's headaches could be cervicogenic. She does have loss of cervical lordosis on most recent MRI.   - Given patient with elevated heart rate, frequent headaches, and has limited therapy participation 2/2 tachycardia in setting of continuous headaches  - Consider starting propranolol 5 mg BID for headache prophylaxis.   - Prn tylenol +/- prn fiorcet   - Lidocaine cream PRN to area of pain for cervicogenic headaches    Pain  Has a chronic pain provider. Pain management consulted with recommendations as below  - Tylenol 650 mg q6h PRN  - IV Dilaudid 0.5 mg q6h PRN  - Oxyocodone 10-15 mg q4h PRN  - Suboxone 2-0.5 mg TID  - Continue Gabapentin 100 mg TID, can increase to 300mg  TID as tolerated  - Voltaren gel QID, lidocaine patches  - Recommend doing patellar taping with therapy or obtain patellar brace to help with pain with ambulation    Multifactorial Ileus   Coffee [place] Emesis 2/2 NGT placement (resolved)  Contributors with history of narcotics, poor PO intake. Concern for malnutrition. Improvements to abdominal pain since weaning opioids. TPN has been discontinued with patient now tolerating tube feeds.  Working on decreasing tube feeds to tolerate p.o. intake  -Continue Tube feeds as guided by nutrition. May discharge home with corpak  -bowel regimen: Naloxegol daily  -NG tube in place  -Trialing food/meds PO prior to NG tube removal   -PO Protonix 40 mg BID     Syncope  Multifactorial. Primary team ddx includes: vasovagal vs adverse effect of medications vs deconditioning vs iron deficiency. Last echo 04/2022  - CTM    IDA  -has allergy to formula iron     [name] - Desmoid Tumors  Follows [facility] (Dr. [name]) is patient's outpatient oncologist.   -Continue nirogacestat at 50 mg twice daily (started 9/3, will increase dose 9/10). Titrate weekly up to 150 mg BID    Enterobacteriaceae group bacteremia (Resolved) - C albicans fungemia (Resolved)   Likely due to RIJ tunneled line placed 01/2022 now removed on 8/14.  8/13, 8/17 repeat blood cultures NGTD.  No evidence of vitreitis, retinitis 8/15 per Ophtho.  TTE 8/15 w possible vegetations on MV and TV or indwelling catheter; however, TEE 8/20 w/o vegetations. ID consulted with recommendations as below. Completed treatment with cephalosporin and fluconazole on 8/31.     Generalized anxiety disorder, depression, h/o panic attacks  Psych consulted with recommendations as below.   - Continue home mirtazapine  - Valium as above, If needed could give 2.5mg  PRN for acute anxiety.    Other Medical Considerations:  While in AIR, the patient has medical issues requiring active management/supervision by a rehabilitation physician, with face-to-face visits at least 3 days per week to assess the patient both medically and functionally and to modify the course of treatment as needed; The Physiatrist leads an intensive and coordinated interdisciplinary team approach to the delivery of inpatient rehabilitative care that cannot be provided in a less intensive setting such as the home, outpatient center or SNF. If not done in hospital, patient is at high risk for falls, respiratory failure, bleeding, infections, or delirium.    Risk for VTE    Patient's Functional Goals  Increase mobility, return to RN school    Functional Status Updates:  Prior Functional Status:    Prior Functional Status: Pt reports following her last hospitalization  she was able to go to the beach briefly and ambulate on the sand. Was also able to play mini-golf but unable to bend down to grab a ball. Since her egg retrieval and the initiation of her new chemo medication, she has been spending most of her time in bed. No falls in the past month, but did have a few falls last hospitalization due to syncope.    Current Functional Status: Therapy notes reviewed and analyzed   Activities of Daily Living:   Assessment  Problem List: Decreased strength, Impaired ADLs, Decreased activity tolerance, Decreased mobility, Fall risk, Gait deviation, Impaired balance, Pain, Decreased endurance, Decreased coordination  Assessment: Pt seen for progression of short term goals. Pt able to complete --- towards OT goals. Pt continues to present with above deficits impacting safe and independent ADL participation. Pt continues to present below baseline and would continue to benefit from skilled OT services to maximize functional independence and safety.  Today's Interventions: ADL retraining, Balance activities, Bed mobility, Compensatory tech. training, Conservation, Education - Patient, Education - Family / caregiver, Endurance activities, Safety education  Chlorhexidine Treatment: Given  Today's Interventions: Pt educated on role of OT, OT POC, importance of OOB activity and ADL participation with Engineer, manufacturing. All patient questions answered appropriately and within scope of practice.            Mobility:   Bed Mobility comments: Supine to sit with SBA, HOB elevated and increased time to complete. Pt utilizing LUE for support but protecting R wrist. Sit to supine with Min A for LEs.        Skilled Treatment Performed: (see goal section)  PT Post Acute Discharge Recommendations: Skilled PT services indicated, 5x weekly, High intensity  Cognition, Swallow, Speech:   Cognition / Swallow / Speech  Patient's Vision Adequate to Safely Complete Daily Activities: Yes  Patient's Judgement Adequate to Safely Complete Daily Activities: Yes  Patient's Memory Adequate to Safely Complete Daily Activities: Yes  Patient Able to Express Needs/Desires: Yes  Patient has speech problem: No           Assistive Devices: Rollator  Precautions:  Safety Interventions  Safety Interventions: bed alarm, family at bedside  Aspiration Precautions: respiratory status monitored  Bleeding Precautions: blood pressure closely monitored  Infection Management: aseptic technique maintained  Isolation Precautions: protective precautions maintained  Latex Precautions: latex precautions maintained    Rehabilitation Plan of Care  - Patient has complex rehab, nursing, and medical needs and may become appropriate for Acute Inpatient Rehabilitation with regular therapy participation / demonstrated progress, transitioned exclusively to oral pain medication, started chemotherapy agent  In AIR the patient would be expected to tolerate minimum of 3 hrs of therapy daily, 5x/week.  -Discussed rehab options today with patient and patient's mother. The patient/family/caregiver was counseled and educated on the purpose of Acute Inpatient Rehabilitation and questions/concerns were addressed.     In order for the patient to be considered for admission to [facility] inpatient rehab, the case manager must give the patient / family choice in post-acute discharge options. If the patient desires [facility], a referral from case management is required for acceptance to [facility] inpatient rehab.  A referral for [facility] inpatient rehab has not been received.     Please page the consult pager from 8AM-5PM weekdays or the first call pager after hours/weekends/holidays with questions.    The patient was seen and examined with Dr. Tanda Rockers, MD, who is in agreement with the above stated assessment and plan.  Thank you for this consult    SUBJECTIVE     Reason for Consult: Patient seen in consultation at the request of Earna Coder, MD for evaluation of rehabilitation needs and recommendations.    History of Present Illness: [name] Reather Converse is a 22 y.o. female currently hospitalized on 09/25/2022 for ileus and inability to tolerate PO intake.    Today  Doing seen working with therapy.  Notes that she has been feeling poorly today and was not able to work as well with therapy as she was hoping.  States that pain has been limiting her today as the team has decreased her IV pain medication.  Notes she has been having discomfort with laxity of quadriceps/patellar tendon.  Notes that previously she was doing patellar taping with therapy which seemed to help.  Has been attempting to increase p.o. intake by reducing tube feeds.    Medical / Surgical History:   Past Medical History:   Diagnosis Date    Abdominal pain     Acid reflux     occas    Anesthesia complication     itching, shaking, coldness; last few surgeries have gone much better    Cancer (CMS-HCC)     Cataract of right eye     COVID-19 virus infection 01/2019    Cyst of thyroid determined by ultrasound     monitoring    [name]     2 right forearm, 1 left thigh, 1 right scapula, 1 under left clavicle; multiple    Difficult intravenous access     [name] (familial adenomatous polyposis)     [name]     Gastric polyps     History of chemotherapy     last treatment approx 05/2019    History of colon polyps     History of COVID-19 01/2019    Iron deficiency anemia due to chronic blood loss     received iron infusion 11-2019    PONV (postoperative nausea and vomiting)     Rectal bleeding     Syncopal episodes     especially if becoming dehydrated     Past Surgical History:   Procedure Laterality Date    COLON SURGERY      cyroablation      cystis removal      desmoid removal      PR CLOSE ENTEROSTOMY,RESEC+ANAST N/A 10/09/2020    Procedure: ILEOSTOMY TAKEDOWN;  Surgeon: Mickle Asper, MD;  Location: OR [place];  Service: General Surgery    PR COLONOSCOPY W/BIOPSY SINGLE/MULTIPLE N/A 10/27/2012    Procedure: COLONOSCOPY, FLEXIBLE, PROXIMAL TO SPLENIC FLEXURE; WITH BIOPSY, SINGLE OR MULTIPLE;  Surgeon: Shirlyn Goltz Mir, MD;  Location: PEDS PROCEDURE ROOM [place];  Service: Gastroenterology    PR COLONOSCOPY W/BIOPSY SINGLE/MULTIPLE N/A 09/14/2013    Procedure: COLONOSCOPY, FLEXIBLE, PROXIMAL TO SPLENIC FLEXURE; WITH BIOPSY, SINGLE OR MULTIPLE;  Surgeon: Shirlyn Goltz Mir, MD;  Location: PEDS PROCEDURE ROOM [place];  Service: Gastroenterology    PR COLONOSCOPY W/BIOPSY SINGLE/MULTIPLE N/A 11/08/2014    Procedure: COLONOSCOPY, FLEXIBLE, PROXIMAL TO SPLENIC FLEXURE; WITH BIOPSY, SINGLE OR MULTIPLE;  Surgeon: Arnold Long Mir, MD;  Location: PEDS PROCEDURE ROOM [facility];  Service: Gastroenterology    PR COLONOSCOPY W/BIOPSY SINGLE/MULTIPLE N/A 12/26/2015    Procedure: COLONOSCOPY, FLEXIBLE, PROXIMAL TO SPLENIC FLEXURE; WITH BIOPSY, SINGLE OR MULTIPLE;  Surgeon: Arnold Long Mir, MD;  Location: PEDS PROCEDURE ROOM [facility];  Service: Gastroenterology    PR COLONOSCOPY W/BIOPSY SINGLE/MULTIPLE N/A 09/02/2017    Procedure:  COLONOSCOPY, FLEXIBLE, PROXIMAL TO SPLENIC FLEXURE; WITH BIOPSY, SINGLE OR MULTIPLE;  Surgeon: Arnold Long Mir, MD;  Location: PEDS PROCEDURE ROOM [facility];  Service: Gastroenterology    PR COLSC FLX W/REMOVAL LESION BY HOT BX FORCEPS N/A 08/27/2016    Procedure: COLONOSCOPY, FLEXIBLE, PROXIMAL TO SPLENIC FLEXURE; W/REMOVAL TUMOR/POLYP/OTHER LESION, HOT BX FORCEP/CAUTE;  Surgeon: Arnold Long Mir, MD;  Location: PEDS PROCEDURE ROOM [facility];  Service: Gastroenterology    PR COLSC FLX W/RMVL OF TUMOR POLYP LESION SNARE TQ N/A 02/25/2019    Procedure: COLONOSCOPY FLEX; W/REMOV TUMOR/LES BY SNARE;  Surgeon: Helyn Numbers, MD;  Location: GI PROCEDURES [facility] [facility];  Service: Gastroenterology    PR COLSC FLX W/RMVL OF TUMOR POLYP LESION SNARE TQ N/A 03/13/2020    Procedure: COLONOSCOPY FLEX; W/REMOV TUMOR/LES BY SNARE;  Surgeon: Helyn Numbers, MD;  Location: [facility] PROCEDURES [place] [facility];  Service: Gastroenterology    PR EXC SKIN BENIG 2.1-3 CM TRUNK,ARM,LEG Right 02/25/2017    Procedure: EXCISION, BENIGN LESION INCLUDE MARGINS, EXCEPT SKIN TAG, LEGS; EXCISED DIAMETER 2.1 TO 3.0 CM;  Surgeon: Clarene Duke, MD;  Location: CHILDRENS OR [facility];  Service: Plastics    PR EXC SKIN BENIG 3.1-4 CM TRUNK,ARM,LEG Right 02/25/2017    Procedure: EXCISION, BENIGN LESION INCLUDE MARGINS, EXCEPT SKIN TAG, ARMS; EXCISED DIAMETER 3.1 TO 4.0 CM;  Surgeon: Clarene Duke, MD;  Location: CHILDRENS OR [facility];  Service: Plastics    PR EXC SKIN BENIG >4 CM FACE,FACIAL Right 02/25/2017    Procedure: EXCISION, OTHER BENIGN LES INCLUD MARGIN, FACE/EARS/EYELIDS/NOSE/LIPS/MUCOUS MEMBRANE; EXCISED DIAM >4.0 CM;  Surgeon: Clarene Duke, MD;  Location: CHILDRENS OR [facility];  Service: Plastics    PR EXC TUMOR SOFT TISSUE LEG/ANKLE SUBQ 3+CM Right 08/05/2019    Procedure: EXCISION, TUMOR, SOFT TISSUE OF LEG OR ANKLE AREA, SUBCUTANEOUS; 3 CM OR GREATER;  Surgeon: Arsenio Katz, MD;  Location: MAIN OR [place];  Service: Plastics    PR EXC TUMOR SOFT TISSUE LEG/ANKLE SUBQ <3CM Right 08/05/2019    Procedure: EXCISION, TUMOR, SOFT TISSUE OF LEG OR ANKLE AREA, SUBCUTANEOUS; LESS THAN 3 CM;  Surgeon: Arsenio Katz, MD;  Location: MAIN OR [facility];  Service: Plastics    PR LAP, SURG PROCTECTOMY W J-POUCH N/A 08/10/2020    Procedure: ROBOTIC ASSISTED LAPAROSCOPIC PROCTOCOLECTOMY, ILEAL J POUCH, WITH OSTOMY;  Surgeon: Mickle Asper, MD;  Location: OR [place];  Service: General Surgery    PR NDSC EVAL INTSTINAL POUCH DX W/COLLJ SPEC SPX N/A 01/23/2021    Procedure: ENDO EVAL SM INTEST POUCH; DX;  Surgeon: Modena Nunnery, MD;  Location: [facility]EDURES [facility] [facility];  Service: Gastroenterology    PR NDSC EVAL INTSTINAL POUCH DX W/COLLJ SPEC SPX N/A 08/27/2021    Procedure: ENDO EVAL SM INTEST POUCH; DX;  Surgeon: Hunt Oris, MD;  Location: GI PROC[facility]URES [facility];  Service: Gastroenterology    PR NDSC EVAL INTSTINAL POUCH DX W/COLLJ SPEC SPX N/A 12/09/2021    Procedure: ENDO EVAL SM INTEST POUCH; DX;  Surgeon: Vidal Schwalbe, MD;  Location: GI PROC[facility]URES [facility];  Service: Gastroenterology    PR NDSC EVAL INTSTINAL POUCH DX W/COLLJ [facility] SPX Left 04/09/2022    Procedure: ENDO EVAL SM INTEST POUCH; DX;  Surgeon: Modena Nunnery, MD;  Location: [facility]EDURES [place] [facility];  Service: Gastroenterology    PR NDSC EVAL INTSTINAL POUCH DX W/COLLJ SPEC SPX N/A 08/05/2022    Procedure: ENDO EVAL SM INTEST POUCH; DX;  Surgeon: Modena Nunnery, MD;  Location: GI  PROCEDURES [facility];  Service: Gastroenterology    PR NDSC EVAL INTSTINAL POUCH W/BX SINGLE/MULTIPLE N/A 01/20/2022    Procedure: ENDOSCOPIC EVAL OF SMALL INTESTINAL POUCH; DIAGNOSTIC, No biopsies;  Surgeon: Andrey Farmer, MD;  Location: GI PROCEDURES [facility];  Service: Gastroenterology    PR NDSC EVAL INTSTINAL POUCH W/BX SINGLE/MULTIPLE N/A 02/13/2022    Procedure: ENDOSCOPIC EVAL OF SMALL INTESTINAL POUCH; DIAGNOSTIC, WITH BIOPSY;  Surgeon: Bronson Curb, MD;  Location: GI PROCEDURES [facility];  Service: Gastroenterology    PR UNLISTED PROCEDURE SMALL INTESTINE  01/23/2021    Procedure: UNLISTED PROCEDURE, SMALL INTESTINE;  Surgeon: Modena Nunnery, MD;  Location: [facility]URES [place] [facility];  Service: Gastroenterology    PR UNLISTED PROCEDURE SMALL INTESTINE  02/13/2022    Procedure: UNLISTED PROCEDURE, SMALL INTESTINE;  Surgeon: Bronson Curb, MD;  Location: GI PROCEDURES [facility];  Service: Gastroenterology    PR UPPER GI ENDOSCOPY,BIOPSY N/A 10/27/2012    Procedure: UGI ENDOSCOPY; WITH BIOPSY, SINGLE OR MULTIPLE;  Surgeon: Shirlyn Goltz Mir, MD;  Location: PEDS PROCEDURE ROOM [facility];  Service: Gastroenterology    PR UPPER GI ENDOSCOPY,BIOPSY N/A 09/14/2013    Procedure: UGI ENDOSCOPY; WITH BIOPSY, SINGLE OR MULTIPLE;  Surgeon: Shirlyn Goltz Mir, MD;  Location: PEDS PROCEDURE ROOM [facility];  Service: Gastroenterology    PR UPPER GI ENDOSCOPY,BIOPSY N/A 11/08/2014    Procedure: UGI ENDOSCOPY; WITH BIOPSY, SINGLE OR MULTIPLE;  Surgeon: Arnold Long Mir, MD;  Location: PEDS PROCEDURE ROOM [facility];  Service: Gastroenterology    PR UPPER GI ENDOSCOPY,BIOPSY N/A 12/26/2015    Procedure: UGI ENDOSCOPY; WITH BIOPSY, SINGLE OR MULTIPLE;  Surgeon: Arnold Long Mir, MD;  Location: PEDS PROCEDURE ROOM [facility];  Service: Gastroenterology    PR UPPER GI ENDOSCOPY,BIOPSY N/A 08/27/2016    Procedure: UGI ENDOSCOPY; WITH BIOPSY, SINGLE OR MULTIPLE;  Surgeon: Arnold Long Mir, MD;  Location: PEDS PROCEDURE ROOM [facility];  Service: Gastroenterology    PR UPPER GI ENDOSCOPY,BIOPSY N/A 09/02/2017    Procedure: UGI ENDOSCOPY; WITH BIOPSY, SINGLE OR MULTIPLE;  Surgeon: Arnold Long Mir, MD;  Location: PEDS PROCEDURE ROOM [facility];  Service: Gastroenterology PR UPPER GI ENDOSCOPY,BIOPSY N/A 03/13/2020    Procedure: UGI ENDOSCOPY; WITH BIOPSY, SINGLE OR MULTIPLE;  Surgeon: Helyn Numbers, MD;  Location: [facility]EDURES [place] [facility];  Service: Gastroenterology    PR UPPER GI ENDOSCOPY,BIOPSY N/A 09/05/2021    Procedure: UGI ENDOSCOPY; WITH BIOPSY, SINGLE OR MULTIPLE;  Surgeon: Wendall Papa, MD;  Location: GI PROCEDURES [facility];  Service: Gastroenterology    PR UPPER GI ENDOSCOPY,DIAGNOSIS N/A 01/20/2022    Procedure: UGI ENDO, INCLUDE ESOPHAGUS, STOMACH, & DUODENUM &/OR JEJUNUM; DX W/WO COLLECTION SPECIMN, BY BRUSH OR WASH;  Surgeon: Andrey Farmer, MD;  Location: GI PROCEDURES [facility];  Service: Gastroenterology    TUMOR REMOVAL      multiple-head, neck, back, hand, right flank, multiple        Social History:   Social History     Tobacco Use    Smoking status: Never     Passive exposure: Past    Smokeless tobacco: Never   Vaping Use    Vaping status: Never Used   Substance Use Topics    Alcohol use: Never    Drug use: Never       Living Environment: House  Lives With: Mother, Father  Home Living: Multi-level home, Bed/bath upstairs, 1/2 bath on main level, Stairs to alternate level with rails, Stairs to enter without rails, Stairs to alternate level without rails, Handicapped  height toilet, Tub/shower unit, Able to Live on main level with bedroom/bathroom  Number of Stairs to Enter (outside): 2  Rail placement (inside): Rail on right side  Number of Stairs to Alternate level (inside): 14  Caregiver Identified?: Yes  Caregiver Availability: 24 hours  Caregiver Ability: Limited lifting    Family History: Reviewed  family history includes Alcohol abuse in her paternal grandfather; Arthritis in her maternal grandfather; Asthma in her maternal grandfather; COPD in her paternal grandmother; Cancer in her maternal grandmother; Diabetes in her maternal grandmother; Hypertension in her maternal grandmother; Miscarriages / [place] in her paternal grandmother; No Known Problems in her brother, father, maternal aunt, maternal uncle, mother, paternal aunt, paternal uncle, and sister; Other in her maternal grandmother; Stroke in her maternal grandmother; Thyroid disease in her maternal grandmother.    Allergies:   Adhesive tape-silicones; Ferrlecit [sodium ferric gluconat-sucrose]; Levofloxacin; Methylnaltrexone; Neomycin; Papaya; Morphine; Zosyn [piperacillin-tazobactam]; Compazine [prochlorperazine]; Iron analogues; Iron dextran; and Latex, natural rubber    Medications:   Scheduled    buprenorphine-naloxone (SUBOXONE) 2-0.5 mg SL film 1 mg of buprenorphine TID    carboxymethylcellulose sodium (REFRESH CELLUVISC) 1 % ophthalmic gel 1 drop QID    diazePAM (VALIUM) tablet 2.5 mg Daily    And    diazePAM (VALIUM) tablet 2.5 mg Daily    diazePAM (VALIUM) tablet 5 mg Nightly    diclofenac sodium (VOLTAREN) 1 % gel 2 g QID    famotidine (PEPCID) tablet 20 mg Nightly    gabapentin (NEURONTIN) capsule 100 mg TID    lidocaine (ASPERCREME) 4 % 2 patch Daily    mirtazapine (REMERON) tablet 15 mg Nightly    multivitamin with folic acid 400 mcg tablet 1 tablet Daily    naloxegol (MOVANTIK) 12.5 mg tablet 12.5 mg Daily    nirogacestat (OGSIVEO) tablet 50 mg *Patient Supplied* BID    pantoprazole (Protonix) EC tablet 40 mg BID     PRN acetaminophen, 650 mg, Q6H PRN  alum-mag-simeth, 60 mL, Q6H PRN  baclofen, 10 mg, TID PRN  bisacodyl, 10 mg, BID PRN  carboxymethylcellulose sodium, 2 drop, QID PRN  Chemo Clarification Order, , Continuous PRN  diazePAM, 2.5 mg, Nightly PRN  guaiFENesin, 200 mg, Q4H PRN  hydrocortisone, , BID PRN  HYDROmorphone, 0.5 mg, Q6H PRN  lidocaine-diphenhydrAMINE-aluminum-magnesium, 10 mL, QID PRN  ondansetron, 4 mg, Q12H PRN  oxyCODONE, 10 mg, Q4H PRN   Or  oxyCODONE, 15 mg, Q4H PRN  phenol, 2 spray, Q2H PRN  polyethylene glycol, 17 g, BID PRN  promethazine, 6.5 mg, Q6H PRN  pseudoephedrine, 30 mg, Daily PRN  senna, 1 tablet, BID PRN  zinc oxide-cod liver oil, , Daily PRN      Continuous Infusions Chemo Clarification Order  sodium chloride, Last Rate: 10 mL/hr (11/09/22 0314)        OBJECTIVE     Vitals:  Temp:  [36.5 ??C (97.7 ??F)-36.9 ??C (98.4 ??F)] 36.9 ??C (98.4 ??F)  Heart Rate:  [86-145] 108  Resp:  [16-18] 18  BP: (108-119)/(52-71) 119/71  MAP (mmHg):  [69-86] 86  SpO2:  [98 %-100 %] 98 %  Vitals:    11/14/22 1119 11/14/22 1145 11/14/22 1239 11/14/22 1300   BP: 111/52  119/71    Pulse: 97 (S) 145 108    Resp: 18  18    Temp: 36.8 ??C (98.2 ??F)  36.9 ??C (98.4 ??F)    TempSrc: Oral  Oral    SpO2: 100%  98%    Weight:  61 kg (134 lb 8 oz)   Height:           Patient Lines/Drains/Airways Status       Active Peripheral & Central Intravenous Access       Name Placement date Placement time Site Days    CVC Triple Lumen 10/22/22 Non-tunneled Right Internal jugular 10/22/22  1521  Internal jugular  23                  Patient Lines/Drains/Airways Status       Active Wounds       None                    Physical Exam:    GEN: Lying in bed in NAD.  HEENT: Normocephalic. Moist mucous membranes. NGT in place  RESP: NWOB on supplemental O2.   CV: Warm and well-perfused.  GI: non distended   GU: deferred  SKIN: without lesions, bruises, ecchymosis, rashes on clothed examination  MSK: TTP to bilateral knees (R>L) with passive/active range of motion   NEURO:  Mental Status: Alert, attention intact, speech fluid and coherent, follows commands well and answers questions appropriately  Sensory: Sensory is intact to light touch with exception of left ulnar distribution involving forearm & 4/5th digits  Motor:            Delt Bi Tri WE HG  RUE (not tested today, previous exam as follows) 5 5 5 5 4     LUE(not tested today, previous exam as follows) 5 5 5 5 4                  HF KE       DF       PF        RLE     2 3 2 3               LLE   2 3 1 3       Lower extremity exam limited by pain    Abbreviations:   Delt= Deltoid, Bi= Biceps, Tri= Triceps, WF= Wrist flexor, WE= Wrist extensor, HG= Hand Grip  HF= Hip flexor, KE= Knee Extensor, DF= Dorsiflexor, PF= Plantar Flexor, EHL= Extensor Hallicus Longus    Tone: Improved tone most prominent to bilateral lower extremities with hip flexion, knee flexor & knee extension, tone is variable, there is increasd tone to bilateral gastroc-soleus complex  Reflexes: no clonus    Labs and Diagnostic Studies: Reviewed     Radiology Results: Reviewed   Reviewed MRI CTL    PVL Venous Duplex Lower Extremity Bilateral    Result Date: 11/14/2022   Peripheral Vascular Lab     [address]   [place], [place] [address]  PVL VENOUS DUPLEX LOWER EXTREMITY BILATERAL Patient Demographics Pt. Name: [name] [name] Location: PVL Inpatient Lab MRN:      [id number]               Sex:      F DOB:      Aug 18, 2000              Age:      21 years  Study Information Authorizing         [id number] DAVID FARID     Performed Time       11/13/2022 10:35:16 Provider Name       [facility]  PM Ordering Physician  Candace Cruise 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 Protocol The major deep veins from the inguinal ligament to the ankle are assessed for bilaterally for compressibility and color and spectral Doppler flow characteristics. The assessed veins include bilateral common femoral vein, femoral vein in the thigh, popliteal vein, and intramuscular calf veins. The iliac vein is assessed indirectly using Doppler waveform analysis. The great saphenous vein is assessed for compressibility at the saphenofemoral junction, and the small saphenous vein assessed for compressibility behind the knee.  Final Interpretation Right There is no evidence of DVT in the lower extremity. There is no evidence of obstruction proximal to the inguinal ligament or in the common femoral vein. Left There is no evidence of DVT in the lower extremity. There is no evidence of obstruction proximal to the inguinal ligament or in the common femoral vein.  Electronically signed by [id number] Jodell Cipro MD on 11/14/2022 at 8:22:18 AM.  -------------------------------------------------------------------------------- Right Duplex Findings All veins visualized appear fully compressible. Doppler flow signals demonstrate normal spontaneity, phasicity, and augmentation.  Left Duplex Findings All veins visualized appear fully compressible. Doppler flow signals demonstrate normal spontaneity, phasicity, and augmentation. Right Technical Summary No evidence of deep venous obstruction in the lower extremity. No indirect evidence of obstruction proximal to the inguinal ligament. Left Technical Summary No evidence of deep venous obstruction in the lower extremity. No indirect evidence of obstruction proximal to the inguinal ligament.  Final     XR Abdomen 1 View    Result Date: 11/13/2022  EXAM: XR ABDOMEN 1 VIEW ACCESSION: [id number] UN     CLINICAL INDICATION: 22 years old with CATHETER NON VASCULAR FIT&ADJ      COMPARISON: Abdominal radiograph dated 10/28/2022     TECHNIQUE: Upright view of the abdomen, 1 image(s)     FINDINGS: Weighted esophagogastric tube is unchanged in position with distal tip overlying the second portion of the duodenum. No dilated bowel loops in the visualized upper abdomen. No acute osseous abnormalities. Lung bases are clear.         Similar positioning of the weighted esophagogastric tube with tip overlying the second portion of the duodenum.         XR Knee 1 or 2 Views Bilateral    Result Date: 11/12/2022  EXAM: XR KNEE 1 OR 2 VIEWS BILATERAL DATE: 11/12/2022 2:39 PM ACCESSION: [id number] UN DICTATED: 11/12/2022 3:10 PM INTERPRETATION LOCATION: Main [facility]     CLINICAL INDICATION: 22 years old Female with pain and swelling with standing    COMPARISON: None provided.     TECHNIQUE: AP and crosstable lateral views of the knees.     FINDINGS: No acute fractures. Normal joint alignment. Preserved joint spaces. Normal soft  tissues.         Normal radiographs of the knees         CT Lumbar Spine W Contrast    Result Date: 11/11/2022  EXAM: Computed tomography, lumbar spine with contrast material. DATE: ACCESSION: [id number] UN DICTATED: 11/11/2022 9:31 AM INTERPRETATION LOCATION: [facility]     CLINICAL INDICATION: 22 years old Female with Severe low back pain, recent bloodstream infection      COMPARISON: Concurrent CT pelvis, CT abdomen/pelvis 10/21/2022     TECHNIQUE: Axial CT images through the lumbar spine with contrast. Coronal and sagittal reformatted images, bone and soft tissue algorithm are provided.     FINDINGS:  There is no fracture. The vertebrae are normally aligned. Nonspecific mild stranding posterior to the paraspinal lumbar spine muscles.     No abnormal contrast enhancement.         No acute abnormalities of the lumbar spine.         CT Pelvis W  Contrast    Result Date: 11/11/2022  EXAM: CT PELVIS W  CONTRAST ACCESSION: [id number] UN CLINICAL INDICATION: Severe low back and pelvic pain, complicated abdominal history with recent bloodstream infection.      COMPARISON: CT abdomen pelvis 10/21/2022     TECHNIQUE: A helical CT scan of the pelvis was obtained with IV contrast from the iliac crests through the pubic symphysis. Images were reconstructed in the axial plane. Coronal and sagittal reformatted images were also provided for further evaluation.     FINDINGS:     LINES AND TUBES: Tip of weighted enteric tube with associated streak artifact noted within the partially visualized duodenum.     BLADDER: Distended, but otherwise unremarkable in appearance.     PELVIC/REPRODUCTIVE ORGANS: Uterus is present.     GI TRACT: Sequelae of proctocolectomy with ileoanal anastomosis. Redemonstration of moderate dilatation at the proximal aspect of the ileoanal anastomosis, which is less prominent compared to the prior study.     PERITONEUM/RETROPERITONEUM AND MESENTERY: Redemonstration of ill-defined soft tissue within the lower central mesentery measuring approximately 2.9 x 2.7 cm (505:25), with associated tethering to loops of adjacent small bowel.     LYMPH NODES: No enlarged lymph nodes.     VESSELS: The aorta is normal in caliber.      BONES AND SOFT TISSUES: Postsurgical changes of the right anterior abdominal wall. Masslike thickening of the right inferior rectus abdominis measuring approximately 3.0 x 1.9 x 3.8 cm (505:64, 507:64). Additional smaller hyperattenuating foci along the lateral margin of the right rectus (505:42, 46). Tiny fat-containing umbilical hernia.             Sequelae of proctocolectomy with ileoanal anastomosis with redemonstrated moderate dilatation of the proximal aspect of the ileoanal anastomosis, which appears less prominent compared to the prior study.     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     Redemonstrated ill-defined soft tissue mass within the lower central mesentery with associated tethering to adjacent small bowel loops, compatible with known desmoid tumor.     Distended urinary bladder, which is otherwise unremarkable in appearance. Correlate clinically for signs of urinary retention.    CT Lower Extremity Bilateral W Contrast    Result Date: 11/11/2022  EXAM: CT LOWER EXTREMITY BILATERAL W CONTRAST DATE: 11/11/2022 9:41 AM ACCESSION: [id number] UN DICTATED: 11/11/2022 9:49 AM INTERPRETATION LOCATION: MAIN CAMPUS     CLINICAL INDICATION: 22 years old Female with Severe  low back and hip pain of unclear etiology, recent bloodstream infection      COMPARISON: None.     TECHNIQUE: An axially-acquired helical CT scan of the bilateral proximal thighs was obtained after administration of IV contrast. Contiguous transverse, coronal and sagittal images were reconstructed at 3-mm increments.     FINDINGS: Small fluid attenuation collections along the proximal right thigh subcutaneous tissues. For reference, proximal anterior lateral thigh measuring 1.5 x 1.0 cm (provide 2:187) and lateral proximal to mid right thigh, measuring 1.3 x 1.3 cm (502:206). No significant adjacent fat stranding.     No acute fracture. Normal alignment of the bilateral hips. No asymmetric widening of the sacroiliac joints. Joint spaces are preserved. No CT evidence of joint effusion.     Please see separately dictated pelvis CT regarding findings in the partially imaged pelvis.             No significant bilateral hip joint effusions or sacroiliac joint effusions. No osseous destruction to suggest any CT evidence of infection involving bilateral hip joints or sacroiliac joints.     Small localized well-circumscribed fluid collections along the anterolateral proximal right thigh as detailed in the body of the report without significant adjacent fat stranding. These findings are nonspecific, possibly reflecting nonspecific subcutaneous cyst, differential includes, hematoma, versus less likely seroma. Superadded infection is difficult to exclude. No fluid collections suggestive of abscesses.

## 2022-11-15 LAB — COMPREHENSIVE METABOLIC PANEL
ALBUMIN: 2.9 g/dL — ABNORMAL LOW (ref 3.4–5.0)
ALKALINE PHOSPHATASE: 125 U/L — ABNORMAL HIGH (ref 46–116)
ALT (SGPT): 33 U/L (ref 10–49)
ANION GAP: 4 mmol/L — ABNORMAL LOW (ref 5–14)
AST (SGOT): 24 U/L (ref <=34)
BILIRUBIN TOTAL: 0.2 mg/dL — ABNORMAL LOW (ref 0.3–1.2)
BLOOD UREA NITROGEN: 7 mg/dL — ABNORMAL LOW (ref 9–23)
BUN / CREAT RATIO: 16
CALCIUM: 8.9 mg/dL (ref 8.7–10.4)
CHLORIDE: 103 mmol/L (ref 98–107)
CO2: 32 mmol/L — ABNORMAL HIGH (ref 20.0–31.0)
CREATININE: 0.43 mg/dL — ABNORMAL LOW
EGFR CKD-EPI (2021) FEMALE: >90 mL/min/{1.73_m2} (ref >=60)
GLUCOSE RANDOM: 112 mg/dL (ref 70–179)
POTASSIUM: 3.6 mmol/L (ref 3.4–4.8)
PROTEIN TOTAL: 5.7 g/dL (ref 5.7–8.2)
SODIUM: 139 mmol/L (ref 135–145)

## 2022-11-15 LAB — CBC
HEMATOCRIT: 28 % — ABNORMAL LOW (ref 34.0–44.0)
HEMOGLOBIN: 9.5 g/dL — ABNORMAL LOW (ref 11.3–14.9)
MEAN CORPUSCULAR HEMOGLOBIN CONC: 33.8 g/dL (ref 32.0–36.0)
MEAN CORPUSCULAR HEMOGLOBIN: 27.9 pg (ref 25.9–32.4)
MEAN CORPUSCULAR VOLUME: 82.5 fL (ref 77.6–95.7)
MEAN PLATELET VOLUME: 9.8 fL (ref 6.8–10.7)
PLATELET COUNT: 151 10*9/L (ref 150–450)
RED BLOOD CELL COUNT: 3.39 10*12/L — ABNORMAL LOW (ref 3.95–5.13)
RED CELL DISTRIBUTION WIDTH: 16.8 % — ABNORMAL HIGH (ref 12.2–15.2)
WBC ADJUSTED: 4.8 10*9/L (ref 3.6–11.2)

## 2022-11-15 MED ADMIN — nirogacestat (OGSIVEO) tablet 50 mg *Patient Supplied*: 50 mg | ORAL | @ 22:00:00 | Stop: 2022-11-18

## 2022-11-15 MED ADMIN — HYDROmorphone (PF) (DILAUDID) injection 0.5 mg: .5 mg | INTRAVENOUS | @ 20:00:00 | Stop: 2022-11-17

## 2022-11-15 MED ADMIN — diclofenac sodium (VOLTAREN) 1 % gel 2 g: 2 g | TOPICAL | @ 21:00:00

## 2022-11-15 MED ADMIN — buprenorphine-naloxone (SUBOXONE) 2-0.5 mg SL film 1 mg of buprenorphine: .5 | SUBLINGUAL | @ 19:00:00

## 2022-11-15 MED ADMIN — gabapentin (NEURONTIN) capsule 100 mg: 100 mg | ORAL | @ 13:00:00

## 2022-11-15 MED ADMIN — diazePAM (VALIUM) tablet 2.5 mg: 2.5 mg | ORAL | @ 19:00:00

## 2022-11-15 MED ADMIN — diazePAM (VALIUM) tablet 2.5 mg: 2.5 mg | ORAL | @ 13:00:00

## 2022-11-15 MED ADMIN — oxyCODONE (ROXICODONE) immediate release tablet 15 mg: 15 mg | ORAL | @ 21:00:00 | Stop: 2022-11-28

## 2022-11-15 MED ADMIN — carboxymethylcellulose sodium (REFRESH CELLUVISC) 1 % ophthalmic gel 1 drop: 1 [drp] | OPHTHALMIC | @ 16:00:00

## 2022-11-15 MED ADMIN — diclofenac sodium (VOLTAREN) 1 % gel 2 g: 2 g | TOPICAL | @ 10:00:00

## 2022-11-15 MED ADMIN — famotidine (PEPCID) tablet 20 mg: 20 mg | ORAL | @ 02:00:00

## 2022-11-15 MED ADMIN — promethazine (PHENERGAN) 6.5 mg in sodium chloride (NS) 0.9 % 50 mL IVPB: 6.5 mg | INTRAVENOUS | @ 03:00:00

## 2022-11-15 MED ADMIN — baclofen (LIORESAL) tablet 10 mg: 10 mg | ORAL | @ 21:00:00

## 2022-11-15 MED ADMIN — oxyCODONE (ROXICODONE) immediate release tablet 15 mg: 15 mg | ORAL | @ 17:00:00 | Stop: 2022-11-28

## 2022-11-15 MED ADMIN — gabapentin (NEURONTIN) capsule 100 mg: 100 mg | ORAL | @ 02:00:00

## 2022-11-15 MED ADMIN — HYDROmorphone (PF) (DILAUDID) injection 0.5 mg: .5 mg | INTRAVENOUS | @ 06:00:00 | Stop: 2022-11-17

## 2022-11-15 MED ADMIN — pantoprazole (Protonix) EC tablet 40 mg: 40 mg | ORAL | @ 02:00:00

## 2022-11-15 MED ADMIN — oxyCODONE (ROXICODONE) immediate release tablet 15 mg: 15 mg | ORAL | @ 02:00:00 | Stop: 2022-11-28

## 2022-11-15 MED ADMIN — diclofenac sodium (VOLTAREN) 1 % gel 2 g: 2 g | TOPICAL | @ 16:00:00

## 2022-11-15 MED ADMIN — carboxymethylcellulose sodium (REFRESH CELLUVISC) 1 % ophthalmic gel 1 drop: 1 [drp] | OPHTHALMIC | @ 21:00:00

## 2022-11-15 MED ADMIN — diclofenac sodium (VOLTAREN) 1 % gel 2 g: 2 g | TOPICAL | @ 02:00:00

## 2022-11-15 MED ADMIN — multivitamin with folic acid 400 mcg tablet 1 tablet: 1 | ORAL | @ 13:00:00

## 2022-11-15 MED ADMIN — nirogacestat (OGSIVEO) tablet 50 mg *Patient Supplied*: 50 mg | ORAL | @ 13:00:00 | Stop: 2022-11-18

## 2022-11-15 MED ADMIN — carboxymethylcellulose sodium (REFRESH CELLUVISC) 1 % ophthalmic gel 1 drop: 1 [drp] | OPHTHALMIC | @ 02:00:00

## 2022-11-15 MED ADMIN — gabapentin (NEURONTIN) capsule 100 mg: 100 mg | ORAL | @ 19:00:00

## 2022-11-15 MED ADMIN — HYDROmorphone (PF) (DILAUDID) injection 0.5 mg: .5 mg | INTRAVENOUS | @ 14:00:00 | Stop: 2022-11-17

## 2022-11-15 MED ADMIN — buprenorphine-naloxone (SUBOXONE) 2-0.5 mg SL film 1 mg of buprenorphine: .5 | SUBLINGUAL | @ 13:00:00

## 2022-11-15 MED ADMIN — buprenorphine-naloxone (SUBOXONE) 2-0.5 mg SL film 1 mg of buprenorphine: .5 | SUBLINGUAL | @ 03:00:00

## 2022-11-15 MED ADMIN — naloxegol (MOVANTIK) 12.5 mg tablet 12.5 mg: 12.5 mg | ORAL | @ 13:00:00

## 2022-11-15 MED ADMIN — ondansetron (ZOFRAN) injection 4 mg: 4 mg | INTRAVENOUS | @ 14:00:00

## 2022-11-15 MED ADMIN — diazePAM (VALIUM) tablet 5 mg: 5 mg | ORAL | @ 02:00:00

## 2022-11-15 MED ADMIN — carboxymethylcellulose sodium (REFRESH CELLUVISC) 1 % ophthalmic gel 1 drop: 1 [drp] | OPHTHALMIC | @ 10:00:00

## 2022-11-15 MED ADMIN — pantoprazole (Protonix) EC tablet 40 mg: 40 mg | ORAL | @ 13:00:00

## 2022-11-15 MED ADMIN — lidocaine (ASPERCREME) 4 % 2 patch: 2 | TRANSDERMAL | @ 13:00:00

## 2022-11-15 NOTE — Unmapped (Incomplete Revision)
Patient admitted for post procedural complications and desmoid tumors. Patient drowsy throughout shift but easily aroused and oriented. Vital signs stable on 2L [place]. Patient complains of 8/10 pain in abdomen and back. PRN dilaudid and oxycodone administered as ordered. Patient complains of nausea, PRN zofran administered as ordered. Dressing changed to non-tunneled right internal jugular. Tube feedings stopped per ordered, tube flushed, restarted 1830 per order. Fall precautions maintained. Will continue to monitor.     Problem: Adult Inpatient Plan of Care  Goal: Plan of Care Review  Outcome: Progressing  Goal: Patient-Specific Goal (Individualized)  Outcome: Progressing  Goal: Absence of Hospital-Acquired Illness or Injury  Outcome: Progressing  Intervention: Identify and Manage Fall Risk  Recent Flowsheet Documentation  Taken 11/15/2022 1000 by Loleta Rose, RN  Safety Interventions:   fall reduction program maintained   environmental modification   lighting adjusted for tasks/safety   low bed  Taken 11/15/2022 0800 by Loleta Rose, RN  Safety Interventions:   fall reduction program maintained   environmental modification   lighting adjusted for tasks/safety   low bed  Taken 11/15/2022 0730 by Loleta Rose, RN  Safety Interventions:   fall reduction program maintained   environmental modification   lighting adjusted for tasks/safety   low bed  Intervention: Prevent Skin Injury  Recent Flowsheet Documentation  Taken 11/15/2022 1000 by Loleta Rose, RN  Positioning for Skin: Supine/Back  Taken 11/15/2022 0800 by Loleta Rose, RN  Positioning for Skin: Supine/Back  Taken 11/15/2022 0730 by Loleta Rose, RN  Positioning for Skin: Supine/Back  Device Skin Pressure Protection: absorbent pad utilized/changed  Skin Protection: incontinence pads utilized  Intervention: Prevent and Manage VTE (Venous Thromboembolism) Risk  Recent Flowsheet Documentation  Taken 11/15/2022 1000 by Loleta Rose, RN  Anti-Embolism Device Type: SCD, Knee  Anti-Embolism Intervention: On  Anti-Embolism Device Location: BLE  Taken 11/15/2022 0800 by Loleta Rose, RN  Anti-Embolism Device Type: SCD, Knee  Anti-Embolism Intervention: On  Anti-Embolism Device Location: BLE  Taken 11/15/2022 0730 by Loleta Rose, RN  Anti-Embolism Device Type: SCD, Knee  Anti-Embolism Intervention: On  Anti-Embolism Device Location: BLE  Intervention: Prevent Infection  Recent Flowsheet Documentation  Taken 11/15/2022 0730 by Loleta Rose, RN  Infection Prevention:   cohorting utilized   hand hygiene promoted   personal protective equipment utilized   rest/sleep promoted   single patient room provided  Goal: Optimal Comfort and Wellbeing  Outcome: Progressing  Goal: Readiness for Transition of Care  Outcome: Progressing  Goal: Rounds/Family Conference  Outcome: Progressing     Problem: Latex Allergy  Goal: Absence of Allergy Symptoms  Outcome: Progressing     Problem: Fall Injury Risk  Goal: Absence of Fall and Fall-Related Injury  Outcome: Progressing  Intervention: Promote Injury-Free Environment  Recent Flowsheet Documentation  Taken 11/15/2022 1000 by Loleta Rose, RN  Safety Interventions:   fall reduction program maintained   environmental modification   lighting adjusted for tasks/safety   low bed  Taken 11/15/2022 0800 by Loleta Rose, RN  Safety Interventions:   fall reduction program maintained   environmental modification   lighting adjusted for tasks/safety   low bed  Taken 11/15/2022 0730 by Loleta Rose, RN  Safety Interventions:   fall reduction program maintained   environmental modification   lighting adjusted for tasks/safety   low bed     Problem: Skin Injury Risk Increased  Goal: Skin Health and Integrity  Outcome: Progressing  Intervention:  Optimize Skin Protection  Recent Flowsheet Documentation  Taken 11/15/2022 1000 by Loleta Rose, RN  Pressure Reduction Techniques: frequent weight shift encouraged  Taken 11/15/2022 0800 by Loleta Rose, RN  Pressure Reduction Techniques: frequent weight shift encouraged  Taken 11/15/2022 0730 by Loleta Rose, RN  Pressure Reduction Techniques: frequent weight shift encouraged  Pressure Reduction Devices: pressure-redistributing mattress utilized  Skin Protection: incontinence pads utilized     Problem: Self-Care Deficit  Goal: Improved Ability to Complete Activities of Daily Living  Outcome: Progressing     Problem: Comorbidity Management  Goal: Blood Glucose Levels Within Targeted Range  Outcome: Progressing  Goal: Blood Pressure in Desired Range  Outcome: Progressing     Problem: Nausea and Vomiting  Goal: Nausea and Vomiting Relief  Outcome: Progressing     Problem: Infection  Goal: Absence of Infection Signs and Symptoms  Outcome: Progressing  Intervention: Prevent or Manage Infection  Recent Flowsheet Documentation  Taken 11/15/2022 0730 by Loleta Rose, RN  Infection Management: aseptic technique maintained     Problem: Pain Acute  Goal: Optimal Pain Control and Function  Outcome: Progressing     Problem: Malnutrition  Goal: Improved Nutritional Intake  Outcome: Progressing

## 2022-11-15 NOTE — Unmapped (Addendum)
Patient admitted for post procedural complications and desmoid tumors. Patient drowsy throughout shift but easily aroused and oriented. Vital signs stable on 2L [place]. Patient complains of 8/10 pain in abdomen and back. PRN dilaudid and oxycodone administered as ordered. Patient complains of nausea, PRN zofran administered as ordered. Dressing changed to non-tunneled right internal jugular. Tube feedings stopped per ordered, tube flushed. Fall precautions maintained. Will continue to monitor.     Problem: Adult Inpatient Plan of Care  Goal: Plan of Care Review  Outcome: Progressing  Goal: Patient-Specific Goal (Individualized)  Outcome: Progressing  Goal: Absence of Hospital-Acquired Illness or Injury  Outcome: Progressing  Intervention: Identify and Manage Fall Risk  Recent Flowsheet Documentation  Taken 11/15/2022 1000 by Loleta Rose, RN  Safety Interventions:   fall reduction program maintained   environmental modification   lighting adjusted for tasks/safety   low bed  Taken 11/15/2022 0800 by Loleta Rose, RN  Safety Interventions:   fall reduction program maintained   environmental modification   lighting adjusted for tasks/safety   low bed  Taken 11/15/2022 0730 by Loleta Rose, RN  Safety Interventions:   fall reduction program maintained   environmental modification   lighting adjusted for tasks/safety   low bed  Intervention: Prevent Skin Injury  Recent Flowsheet Documentation  Taken 11/15/2022 1000 by Loleta Rose, RN  Positioning for Skin: Supine/Back  Taken 11/15/2022 0800 by Loleta Rose, RN  Positioning for Skin: Supine/Back  Taken 11/15/2022 0730 by Loleta Rose, RN  Positioning for Skin: Supine/Back  Device Skin Pressure Protection: absorbent pad utilized/changed  Skin Protection: incontinence pads utilized  Intervention: Prevent and Manage VTE (Venous Thromboembolism) Risk  Recent Flowsheet Documentation  Taken 11/15/2022 1000 by Loleta Rose, RN  Anti-Embolism Device Type: SCD, Knee  Anti-Embolism Intervention: On  Anti-Embolism Device Location: BLE  Taken 11/15/2022 0800 by Loleta Rose, RN  Anti-Embolism Device Type: SCD, Knee  Anti-Embolism Intervention: On  Anti-Embolism Device Location: BLE  Taken 11/15/2022 0730 by Loleta Rose, RN  Anti-Embolism Device Type: SCD, Knee  Anti-Embolism Intervention: On  Anti-Embolism Device Location: BLE  Intervention: Prevent Infection  Recent Flowsheet Documentation  Taken 11/15/2022 0730 by Loleta Rose, RN  Infection Prevention:   cohorting utilized   hand hygiene promoted   personal protective equipment utilized   rest/sleep promoted   single patient room provided  Goal: Optimal Comfort and Wellbeing  Outcome: Progressing  Goal: Readiness for Transition of Care  Outcome: Progressing  Goal: Rounds/Family Conference  Outcome: Progressing     Problem: Latex Allergy  Goal: Absence of Allergy Symptoms  Outcome: Progressing     Problem: Fall Injury Risk  Goal: Absence of Fall and Fall-Related Injury  Outcome: Progressing  Intervention: Promote Injury-Free Environment  Recent Flowsheet Documentation  Taken 11/15/2022 1000 by Loleta Rose, RN  Safety Interventions:   fall reduction program maintained   environmental modification   lighting adjusted for tasks/safety   low bed  Taken 11/15/2022 0800 by Loleta Rose, RN  Safety Interventions:   fall reduction program maintained   environmental modification   lighting adjusted for tasks/safety   low bed  Taken 11/15/2022 0730 by Loleta Rose, RN  Safety Interventions:   fall reduction program maintained   environmental modification   lighting adjusted for tasks/safety   low bed     Problem: Skin Injury Risk Increased  Goal: Skin Health and Integrity  Outcome: Progressing  Intervention: Optimize Skin Protection  Recent Flowsheet Documentation  Taken 11/15/2022 1000 by Loleta Rose, RN  Pressure Reduction Techniques: frequent weight shift encouraged  Taken 11/15/2022 0800 by Loleta Rose, RN  Pressure Reduction Techniques: frequent weight shift encouraged  Taken 11/15/2022 0730 by Loleta Rose, RN  Pressure Reduction Techniques: frequent weight shift encouraged  Pressure Reduction Devices: pressure-redistributing mattress utilized  Skin Protection: incontinence pads utilized     Problem: Self-Care Deficit  Goal: Improved Ability to Complete Activities of Daily Living  Outcome: Progressing     Problem: Comorbidity Management  Goal: Blood Glucose Levels Within Targeted Range  Outcome: Progressing  Goal: Blood Pressure in Desired Range  Outcome: Progressing     Problem: Nausea and Vomiting  Goal: Nausea and Vomiting Relief  Outcome: Progressing     Problem: Infection  Goal: Absence of Infection Signs and Symptoms  Outcome: Progressing  Intervention: Prevent or Manage Infection  Recent Flowsheet Documentation  Taken 11/15/2022 0730 by Loleta Rose, RN  Infection Management: aseptic technique maintained     Problem: Pain Acute  Goal: Optimal Pain Control and Function  Outcome: Progressing     Problem: Malnutrition  Goal: Improved Nutritional Intake  Outcome: Progressing

## 2022-11-15 NOTE — Unmapped (Signed)
Department of Anesthesiology  Pain Medicine Division    Chronic Pain Followup Inpatient Consult Note    Requesting Attending Physician:  Erlinda Hong, MD  Service Requesting Consult:  [facility])    Assessment/Recommendations:  The patient was seen in consultation on request of Rocco Serene, MD regarding assistance with pain management for patient's chronic intractable abdominal pain with now acute on chronic recurrent ileus/NG tube placement.  Primary team requesting consideration for ketamine infusion or lidocaine infusion, to reduce narcotic usage due to concern for narcotic bowel syndrome and now has an ileus that has resolved but course has been complicated by a line infection and she has completed IV abx for.      Patient currently established with [facility] pain management center Dr. Dyann Ruddle, has been on oxycodone 10 mg 3 times daily, Suboxone 0.5 mg 3 times daily along with baclofen and pregabalin. Overall goal is to wean to buprenorphine and non-opioid medications and eventually off of buprenorphine as well. She is no longer on Ativan but taking Valium per psych recommendations.     Patient has had multiple prior inpatient stays either regarding acute exacerbation of chronic pain or ileus.  In the past 12 months she has had 2 infusions of ketamine with limited benefit (last infusion in August 2024) and at one point there was some transaminitis which was thought to be attributed from other etiologies like TPN versus other intra-abdominal pathologies.  In the past patient also has had lidocaine infusions during inpatient stay with limited/variable benefit.           This is no doubt a very complex situation. Currently her new goal is to get to acute inpatient rehab at [facility]. She understands the barriers of her IV pain medications, her activity tolerance and other medical barriers. She is currently trying to work more with therapy and working towards tolerating 3 hours. She has been able to tolerate therapy well and ambulating with breaks. She does require the tele during therapy as she will pass out if heart rate gets too high.       Interval Events: This morning [name] [name] is in much better spirits and continues to be very motivated to get to [facility], she reports pain is stable today. No planned changes to her pain medication regimen.       Vital Sign Trends: Vitally stable, afebrile.    Current Hospital Analgesic Medications:  Baclofen 10 mg TID  Suboxone 1-0.5 mg SL TID (0.5 film)   Valium 2.5mg  Am and 5mg  nightly (not using PRN)   Gabapentin 100mg  TID   APAP 650 mg q6 PRN  Oxycodone 10mg  or 15mg  q4h PRN (has been taking the 15mg  around 3-4 times a day)   IV dilaudid 0.5mg  q6h PRN (using 4 x a day)     Recommendations:  -The chronic pain service is a consult service and does not place orders, just makes recommendations (except ketamine and lidocaine infusions)   -Please evaluate all patients on opioids for appropriateness of prescribing narcan at discharge.  The chronic pain service can assist with this.  Nasal narcan is covered by most insurances.  -Recommendations given apply to the current hospitalization and do not reflect long term recommendations.    - Continue Baclofen,valium, gabapentin, oxycodone as above   - Continue PRN acetaminophen  - continue Suboxone 2-0.5, 1/2 film   -continue IV dilaudid 0.5mg  q6h PRN   - Recommend continued physical therapy,  please try to see daily or  give family/nursing staff exercises to complete   - Recommend involvement of inpatient psychology if possible and complex care given patient's distress surrounding hospitalization  - Patient would likely benefit from pain psychology evaluation post-discharge (in [facility])    We will continue to follow.    Naloxone Rx at discharge?  Is patient on opioids? Yes.  1)Is dose >50MME?  Yes.  2) Is patient prescribed a benzodiazepine (w opioids)? Yes.  3)Hx of overdose?  No.  4) Hx of substance use disorder? No.  5) Opioids likely to last greater than a week after discharge? Yes.     If yes to 2 or more, prescribe naloxone at discharge.  Nasal narcan for most insured (Nasal narcan 4mg /actuation, prescribe 1 kit, instructions at [email or web address]).  For uninsured, chronic pain can work to assist in finding an option.  OTC nasal narcan now available at most pharmacies for around $45.    Interim History  There were no Acute Events Overnight. She reports sleeping well last night.  The patient is obtaining adequate pain relief on current medication regimen and feels that their pain is Somewhat controlled.     Inpatient Medications  Current Facility-Administered Medications   Medication Dose Route Frequency Provider Last Rate Last Admin    acetaminophen (TYLENOL) tablet 650 mg  650 mg Enteral tube: gastric Q6H PRN Anell Barr, MD   650 mg at 10/22/22 2123    aluminum-magnesium hydroxide-simethicone (MAALOX PLUS) 200-200-20 mg/5 mL suspension 60 mL  60 mL Oral Q6H PRN Anell Barr, MD        baclofen (LIORESAL) tablet 10 mg  10 mg Oral TID PRN Earna Coder, MD        bisacodyl (DULCOLAX) suppository 10 mg  10 mg Rectal BID PRN Anell Barr, MD        buprenorphine-naloxone (SUBOXONE) 2-0.5 mg SL film 1 mg of buprenorphine  0.5 Film Sublingual TID Earna Coder, MD   1 mg of buprenorphine at 11/14/22 2325    carboxymethylcellulose sodium (REFRESH CELLUVISC) 1 % ophthalmic gel 1 drop  1 drop Both Eyes QID Dacillo-Curso, Amy, ACNP   1 drop at 11/15/22 0533    carboxymethylcellulose sodium (REFRESH CELLUVISC) 1 % ophthalmic gel 2 drop  2 drop Both Eyes QID PRN Dacillo-Curso, Amy, ACNP   2 drop at 11/01/22 1655    CHEMO CLARIFICATION ORDER   Other Continuous PRN Dacillo-Curso, Amy, ACNP        diazePAM (VALIUM) tablet 2.5 mg  2.5 mg Oral Daily Kateri Plummer, MD   2.5 mg at 11/14/22 4401    And    diazePAM (VALIUM) tablet 2.5 mg  2.5 mg Oral Daily Kateri Plummer, MD   2.5 mg at 11/14/22 1406    diazePAM (VALIUM) tablet 2.5 mg  2.5 mg Oral Nightly PRN Kateri Plummer, MD        diazePAM (VALIUM) tablet 5 mg  5 mg Oral Nightly Kateri Plummer, MD   5 mg at 11/14/22 2223    diclofenac sodium (VOLTAREN) 1 % gel 2 g  2 g Topical QID Dacillo-Curso, Amy, ACNP   2 g at 11/15/22 0533    famotidine (PEPCID) tablet 20 mg  20 mg Oral Nightly Anell Barr, MD   20 mg at 11/14/22 2224    gabapentin (NEURONTIN) capsule 100 mg  100 mg Oral TID Dacillo-Curso, Amy, ACNP   100 mg at 11/14/22 2224    guaiFENesin (ROBITUSSIN) oral  syrup  200 mg Oral Q4H PRN Anell Barr, MD   200 mg at 10/17/22 2344    hydrocortisone 2.5 % cream   Topical BID PRN Dacillo-Curso, Amy, ACNP   Given at 11/11/22 2223    HYDROmorphone (PF) (DILAUDID) injection 0.5 mg  0.5 mg Intravenous Q6H PRN Earna Coder, MD   0.5 mg at 11/15/22 0149    lidocaine (ASPERCREME) 4 % 2 patch  2 patch Transdermal Daily Kateri Plummer, MD   2 patch at 11/14/22 4098    mirtazapine (REMERON) tablet 15 mg  15 mg Enteral tube: gastric Nightly Anell Barr, MD   15 mg at 11/12/22 2203    mucositis mixture (with lidocaine)  10 mL Mouth QID PRN Dacillo-Curso, Amy, ACNP   10 mL at 10/26/22 2146    multivitamin with folic acid 400 mcg tablet 1 tablet  1 tablet Oral Daily Anell Barr, MD   1 tablet at 11/14/22 0857    naloxegol (MOVANTIK) 12.5 mg tablet 12.5 mg  12.5 mg Oral Daily Anell Barr, MD   12.5 mg at 11/14/22 0902    nirogacestat (OGSIVEO) tablet 50 mg *Patient Supplied*  50 mg Oral BID Earna Coder, MD   50 mg at 11/14/22 1938    ondansetron (ZOFRAN) injection 4 mg  4 mg Intravenous Q12H PRN Dacillo-Curso, Amy, ACNP   4 mg at 11/14/22 1939    oxyCODONE (ROXICODONE) immediate release tablet 10 mg  10 mg Oral Q4H PRN Hemsey, Huntley Estelle, MD        Or    oxyCODONE (ROXICODONE) immediate release tablet 15 mg  15 mg Oral Q4H PRN Earna Coder, MD   15 mg at 11/14/22 2223    pantoprazole (Protonix) EC tablet 40 mg  40 mg Oral BID Anell Barr, MD   40 mg at 11/14/22 2224    phenol (CHLORASEPTIC) 1.4 % spray 2 spray  2 spray Mucous Membrane Q2H PRN Dacillo-Curso, Amy, ACNP   2 spray at 10/03/22 0138    polyethylene glycol (MIRALAX) packet 17 g  17 g Oral BID PRN Anell Barr, MD   17 g at 11/10/22 2132    promethazine (PHENERGAN) 6.5 mg in sodium chloride (NS) 0.9 % 50 mL IVPB  6.5 mg Intravenous Q6H PRN Dacillo-Curso, Amy, ACNP   Stopped at 11/14/22 2349    pseudoephedrine (SUDAFED) tablet 30 mg  30 mg Oral Daily PRN Anell Barr, MD        senna [facility]) tablet 1 tablet  1 tablet Oral BID PRN Anell Barr, MD   1 tablet at 11/13/22 2011    sodium chloride (NS) 0.9 % infusion  10 mL/hr Intravenous Continuous Dacillo-Curso, Amy, ACNP 10 mL/hr at 11/09/22 0314 10 mL/hr at 11/09/22 0314    zinc oxide-cod liver oil (DESITIN 40%) Paste   Topical Daily PRN Dacillo-Curso, Amy, ACNP   Given at 11/11/22 2223     Objective:     Vital Signs    Temp:  [36.6 ??C (97.9 ??F)-36.9 ??C (98.4 ??F)] 36.6 ??C (97.9 ??F)  Heart Rate:  [90-145] 90  Resp:  [18] 18  BP: (94-121)/(50-73) 94/50  MAP (mmHg):  [63-87] 63  SpO2:  [95 %-100 %] 95 %    Physical Exam     GENERAL:  Well developed, well-nourished female in no distress.  HEAD/NECK:    Normocephalic/atraumatic. NGT present.  CARDIOVASCULAR:   WWP, regular tate  LUNGS:   Normal work  of breathing, [place]  ABDOMEN  Soft, non-distended.  EXTREMITIES:  Warm and well-perfused.  NEUROLOGIC:    The patient was alert and oriented times four with normal language, attention, cognition and memory. Cranial nerve exam was grossly normal.    MUSCULOSKELETAL:    SKIN:  No obvious rashes lesions or erythema  PSY:  Appropriate affect and mood.    Test Results    Lab Results   Component Value Date    CREATININE 0.43 (L) 11/15/2022     Lab Results   Component Value Date    ALKPHOS 125 (H) 11/15/2022    BILITOT 0.2 (L) 11/15/2022    BILIDIR 0.10 10/29/2022    PROT 5.7 11/15/2022    ALBUMIN 2.9 (L) 11/15/2022    ALT 33 11/15/2022    AST 24 11/15/2022     Problem List    Principal Problem:    Other acute postprocedural pain  Active Problems:    [name]    Intestinal polyps    [name]    Neoplasm related pain    Physical deconditioning    History of colectomy    Intractable abdominal pain    Generalized anxiety disorder with panic attacks    SBO (small bowel obstruction) (CMS-HCC)    Small intestinal bacterial overgrowth (SIBO)    Susy Manor, DO   Pain Fellow   PGY-5

## 2022-11-15 NOTE — Unmapped (Signed)
A&O x4. Up x1-2 asst. Tolerating PO feed and pills with apple sauce. Reported nausea. Given IV zofran x1 and Phenergan x1. Has corepack NG tube. Receiving TF Osmo at 20ml/hr with 30ml H2O flush. Skin intact. C/o pain along back and around R shoulder blade. Given PRN pain medications. On tele with contin O2 monitor. R internal jugular x3 lumen with KVO. Morning labs sent.   Problem: Adult Inpatient Plan of Care  Goal: Plan of Care Review  Outcome: Ongoing - Unchanged  Goal: Patient-Specific Goal (Individualized)  Outcome: Ongoing - Unchanged  Goal: Absence of Hospital-Acquired Illness or Injury  Outcome: Ongoing - Unchanged  Intervention: Identify and Manage Fall Risk  Recent Flowsheet Documentation  Taken 11/15/2022 0400 by Colman Cater, Quavis Klutz, RN  Safety Interventions:   environmental modification   fall reduction program maintained   enteral feeding safety  Taken 11/15/2022 0200 by Colman Cater, Camilia Caywood, RN  Safety Interventions:   enteral feeding safety   environmental modification  Taken 11/15/2022 0000 by Colman Cater, Terryn Redner, RN  Safety Interventions:   environmental modification   fall reduction program maintained  Taken 11/14/2022 2200 by Colman Cater, Melora Menon, RN  Safety Interventions:   environmental modification   fall reduction program maintained  Taken 11/14/2022 2000 by Colman Cater, Ezrah Dembeck, RN  Safety Interventions:   environmental modification   fall reduction program maintained  Intervention: Prevent and Manage VTE (Venous Thromboembolism) Risk  Recent Flowsheet Documentation  Taken 11/14/2022 2000 by Colman Cater, Roslynn Holte, RN  VTE Prevention/Management:   ambulation promoted   patient refused intervention  Anti-Embolism Intervention: On  Anti-Embolism Device Location: BLE  Intervention: Prevent Infection  Recent Flowsheet Documentation  Taken 11/15/2022 0400 by Colman Cater, Valta Dillon, RN  Infection Prevention:   environmental surveillance performed   rest/sleep promoted hand hygiene promoted  Taken 11/15/2022 0200 by Colman Cater, Baillie Mohammad, RN  Infection Prevention:   environmental surveillance performed   rest/sleep promoted  Taken 11/15/2022 0000 by Colman Cater, Quang Thorpe, RN  Infection Prevention:   environmental surveillance performed   rest/sleep promoted  Taken 11/14/2022 2200 by Colman Cater, Avrianna Smart, RN  Infection Prevention: rest/sleep promoted  Taken 11/14/2022 2000 by Colman Cater, Shacoria Latif, RN  Infection Prevention: rest/sleep promoted  Goal: Optimal Comfort and Wellbeing  Outcome: Ongoing - Unchanged  Goal: Readiness for Transition of Care  Outcome: Ongoing - Unchanged  Goal: Rounds/Family Conference  Outcome: Ongoing - Unchanged     Problem: Latex Allergy  Goal: Absence of Allergy Symptoms  Outcome: Ongoing - Unchanged     Problem: Fall Injury Risk  Goal: Absence of Fall and Fall-Related Injury  Outcome: Ongoing - Unchanged  Intervention: Identify and Manage Contributors  Recent Flowsheet Documentation  Taken 11/14/2022 2000 by Colman Cater, Hendrix Console, RN  Self-Care Promotion: independence encouraged  Intervention: Promote Injury-Free Environment  Recent Flowsheet Documentation  Taken 11/15/2022 0400 by Colman Cater, Tyran Huser, RN  Safety Interventions:   environmental modification   fall reduction program maintained   enteral feeding safety  Taken 11/15/2022 0200 by Colman Cater, Valine Drozdowski, RN  Safety Interventions:   enteral feeding safety   environmental modification  Taken 11/15/2022 0000 by Colman Cater, Lymon Kidney, RN  Safety Interventions:   environmental modification   fall reduction program maintained  Taken 11/14/2022 2200 by Colman Cater, Lya Holben, RN  Safety Interventions:   environmental modification   fall reduction program maintained  Taken 11/14/2022 2000 by Colman Cater, Akhilesh Sassone, RN  Safety Interventions:   environmental modification   fall reduction program maintained  Problem: Skin Injury Risk Increased  Goal: Skin Health and Integrity  Outcome: Ongoing - Unchanged     Problem: Self-Care Deficit  Goal: Improved Ability to Complete Activities of Daily Living  Outcome: Ongoing - Unchanged  Intervention: Promote Activity and Functional Independence  Recent Flowsheet Documentation  Taken 11/14/2022 2000 by Colman Cater, Ilynn Stauffer, RN  Self-Care Promotion: independence encouraged     Problem: Comorbidity Management  Goal: Blood Glucose Levels Within Targeted Range  Outcome: Ongoing - Unchanged  Goal: Blood Pressure in Desired Range  Outcome: Ongoing - Unchanged     Problem: Nausea and Vomiting  Goal: Nausea and Vomiting Relief  Outcome: Ongoing - Unchanged     Problem: Infection  Goal: Absence of Infection Signs and Symptoms  Outcome: Ongoing - Unchanged  Intervention: Prevent or Manage Infection  Recent Flowsheet Documentation  Taken 11/15/2022 0400 by Colman Cater, Jadore Mcguffin, RN  Infection Management: aseptic technique maintained  Taken 11/15/2022 0200 by Colman Cater, Shamekia Tippets, RN  Infection Management: aseptic technique maintained  Taken 11/15/2022 0000 by Colman Cater, Julus Kelley, RN  Infection Management: aseptic technique maintained  Taken 11/14/2022 2200 by Colman Cater, Lashanna Angelo, RN  Infection Management: aseptic technique maintained  Taken 11/14/2022 2000 by Colman Cater, Jadaya Sommerfield, RN  Infection Management: aseptic technique maintained     Problem: Pain Acute  Goal: Optimal Pain Control and Function  Outcome: Ongoing - Unchanged     Problem: Malnutrition  Goal: Improved Nutritional Intake  Outcome: Ongoing - Unchanged

## 2022-11-16 LAB — COMPREHENSIVE METABOLIC PANEL
ALBUMIN: 3 g/dL — ABNORMAL LOW (ref 3.4–5.0)
ALKALINE PHOSPHATASE: 113 U/L (ref 46–116)
ALT (SGPT): 28 U/L (ref 10–49)
ANION GAP: 5 mmol/L (ref 5–14)
AST (SGOT): 23 U/L (ref <=34)
BILIRUBIN TOTAL: <0.2 mg/dL — ABNORMAL LOW (ref 0.3–1.2)
BLOOD UREA NITROGEN: 6 mg/dL — ABNORMAL LOW (ref 9–23)
BUN / CREAT RATIO: 14
CALCIUM: 8.8 mg/dL (ref 8.7–10.4)
CHLORIDE: 106 mmol/L (ref 98–107)
CO2: 30 mmol/L (ref 20.0–31.0)
CREATININE: 0.43 mg/dL — ABNORMAL LOW
EGFR CKD-EPI (2021) FEMALE: >90 mL/min/{1.73_m2} (ref >=60)
GLUCOSE RANDOM: 99 mg/dL (ref 70–179)
POTASSIUM: 3.7 mmol/L (ref 3.4–4.8)
PROTEIN TOTAL: 5.7 g/dL (ref 5.7–8.2)
SODIUM: 141 mmol/L (ref 135–145)

## 2022-11-16 MED ADMIN — diazePAM (VALIUM) tablet 2.5 mg: 2.5 mg | ORAL | @ 14:00:00

## 2022-11-16 MED ADMIN — ondansetron (ZOFRAN) injection 4 mg: 4 mg | INTRAVENOUS | @ 09:00:00

## 2022-11-16 MED ADMIN — diclofenac sodium (VOLTAREN) 1 % gel 2 g: 2 g | TOPICAL | @ 09:00:00

## 2022-11-16 MED ADMIN — oxyCODONE (ROXICODONE) immediate release tablet 15 mg: 15 mg | ORAL | @ 14:00:00 | Stop: 2022-11-28

## 2022-11-16 MED ADMIN — famotidine (PEPCID) tablet 20 mg: 20 mg | ORAL | @ 01:00:00

## 2022-11-16 MED ADMIN — buprenorphine-naloxone (SUBOXONE) 2-0.5 mg SL film 1 mg of buprenorphine: .5 | SUBLINGUAL | @ 02:00:00

## 2022-11-16 MED ADMIN — HYDROmorphone (PF) (DILAUDID) injection 0.5 mg: .5 mg | INTRAVENOUS | @ 17:00:00 | Stop: 2022-11-17

## 2022-11-16 MED ADMIN — carboxymethylcellulose sodium (REFRESH CELLUVISC) 1 % ophthalmic gel 1 drop: 1 [drp] | OPHTHALMIC | @ 01:00:00

## 2022-11-16 MED ADMIN — promethazine (PHENERGAN) 6.5 mg in sodium chloride (NS) 0.9 % 50 mL IVPB: 6.5 mg | INTRAVENOUS | @ 15:00:00

## 2022-11-16 MED ADMIN — HYDROmorphone (PF) (DILAUDID) injection 0.5 mg: .5 mg | INTRAVENOUS | @ 02:00:00 | Stop: 2022-11-17

## 2022-11-16 MED ADMIN — carboxymethylcellulose sodium (REFRESH CELLUVISC) 1 % ophthalmic gel 1 drop: 1 [drp] | OPHTHALMIC | @ 09:00:00

## 2022-11-16 MED ADMIN — oxyCODONE (ROXICODONE) immediate release tablet 15 mg: 15 mg | ORAL | @ 05:00:00 | Stop: 2022-11-28

## 2022-11-16 MED ADMIN — gabapentin (NEURONTIN) capsule 100 mg: 100 mg | ORAL | @ 01:00:00

## 2022-11-16 MED ADMIN — gabapentin (NEURONTIN) capsule 100 mg: 100 mg | ORAL | @ 14:00:00

## 2022-11-16 MED ADMIN — nirogacestat (OGSIVEO) tablet 50 mg *Patient Supplied*: 50 mg | ORAL | @ 11:00:00 | Stop: 2022-11-18

## 2022-11-16 MED ADMIN — oxyCODONE (ROXICODONE) immediate release tablet 15 mg: 15 mg | ORAL | @ 22:00:00 | Stop: 2022-11-28

## 2022-11-16 MED ADMIN — lidocaine (ASPERCREME) 4 % 2 patch: 2 | TRANSDERMAL | @ 14:00:00

## 2022-11-16 MED ADMIN — diclofenac sodium (VOLTAREN) 1 % gel 2 g: 2 g | TOPICAL | @ 01:00:00

## 2022-11-16 MED ADMIN — pantoprazole (Protonix) EC tablet 40 mg: 40 mg | ORAL | @ 14:00:00

## 2022-11-16 MED ADMIN — promethazine (PHENERGAN) 6.5 mg in sodium chloride (NS) 0.9 % 50 mL IVPB: 6.5 mg | INTRAVENOUS | @ 01:00:00

## 2022-11-16 MED ADMIN — nirogacestat (OGSIVEO) tablet 50 mg *Patient Supplied*: 50 mg | ORAL | @ 23:00:00 | Stop: 2022-11-18

## 2022-11-16 MED ADMIN — senna (SENOKOT) tablet 1 tablet: 1 | ORAL | @ 02:00:00

## 2022-11-16 MED ADMIN — diazePAM (VALIUM) tablet 5 mg: 5 mg | ORAL | @ 01:00:00

## 2022-11-16 MED ADMIN — naloxegol (MOVANTIK) 12.5 mg tablet 12.5 mg: 12.5 mg | ORAL | @ 14:00:00

## 2022-11-16 MED ADMIN — diazePAM (VALIUM) tablet 2.5 mg: 2.5 mg | ORAL | @ 18:00:00

## 2022-11-16 MED ADMIN — pantoprazole (Protonix) EC tablet 40 mg: 40 mg | ORAL | @ 01:00:00

## 2022-11-16 MED ADMIN — HYDROmorphone (PF) (DILAUDID) injection 0.5 mg: .5 mg | INTRAVENOUS | @ 09:00:00 | Stop: 2022-11-17

## 2022-11-16 MED ADMIN — buprenorphine-naloxone (SUBOXONE) 2-0.5 mg SL film 1 mg of buprenorphine: .5 | SUBLINGUAL | @ 18:00:00

## 2022-11-16 MED ADMIN — buprenorphine-naloxone (SUBOXONE) 2-0.5 mg SL film 1 mg of buprenorphine: .5 | SUBLINGUAL | @ 14:00:00

## 2022-11-16 MED ADMIN — multivitamin with folic acid 400 mcg tablet 1 tablet: 1 | ORAL | @ 14:00:00

## 2022-11-16 MED ADMIN — promethazine (PHENERGAN) 6.5 mg in sodium chloride (NS) 0.9 % 50 mL IVPB: 6.5 mg | INTRAVENOUS | @ 22:00:00

## 2022-11-16 MED ADMIN — gabapentin (NEURONTIN) capsule 100 mg: 100 mg | ORAL | @ 18:00:00

## 2022-11-16 NOTE — Unmapped (Signed)
Department of Anesthesiology  Pain Medicine Division    Chronic Pain Followup Inpatient Consult Note    Requesting Attending Physician:  Erlinda Hong, MD  Service Requesting Consult:  Med Bernita Raisin [facility])    Assessment/Recommendations:  The patient was seen in consultation on request of Rocco Serene, MD regarding assistance with pain management for patient's chronic intractable abdominal pain with now acute on chronic recurrent ileus/NG tube placement.  Primary team requesting consideration for ketamine infusion or lidocaine infusion, to reduce narcotic usage due to concern for narcotic bowel syndrome and now has an ileus that has resolved but course has been complicated by a line infection and she has completed IV abx for.      Patient currently established with [facility] pain management center Dr. Dyann Ruddle, has been on oxycodone 10 mg 3 times daily, Suboxone 0.5 mg 3 times daily along with baclofen and pregabalin. Overall goal is to wean to buprenorphine and non-opioid medications and eventually off of buprenorphine as well. She is no longer on Ativan but taking Valium per psych recommendations.     Patient has had multiple prior inpatient stays either regarding acute exacerbation of chronic pain or ileus.  In the past 12 months she has had 2 infusions of ketamine with limited benefit (last infusion in August 2024) and at one point there was some transaminitis which was thought to be attributed from other etiologies like TPN versus other intra-abdominal pathologies.  In the past patient also has had lidocaine infusions during inpatient stay with limited/variable benefit.           This is no doubt a very complex situation. Currently her new goal is to get to acute inpatient rehab at [facility]. She understands the barriers of her IV pain medications, her activity tolerance and other medical barriers. She is currently trying to work more with therapy and working towards tolerating 3 hours. She has been able to tolerate therapy well and ambulating with breaks. She does require the tele during therapy as she will pass out if heart rate gets too high.       Interval Events: This morning [name] [name] is doing well. She reports pain is stable but yesterday she worked on distraction therapy and asked for the PO oxycodone before her IV dilaudid. She continues to be very motivated to get to AIR. No planned changes to her pain plan.     Vital Sign Trends: Vitally stable, afebrile.    Current Hospital Analgesic Medications:  Baclofen 10 mg TID  Suboxone 1-0.5 mg SL TID (0.5 film)   Valium 2.5mg  Am and 5mg  nightly (not using PRN)   Gabapentin 100mg  TID   APAP 650 mg q6 PRN  Oxycodone 10mg  or 15mg  q4h PRN (has been taking the 15mg  around 3-4 times a day)   IV dilaudid 0.5mg  q6h PRN (using 3 x a day)     Recommendations:  -The chronic pain service is a consult service and does not place orders, just makes recommendations (except ketamine and lidocaine infusions)   -Please evaluate all patients on opioids for appropriateness of prescribing narcan at discharge.  The chronic pain service can assist with this.  Nasal narcan is covered by most insurances.  -Recommendations given apply to the current hospitalization and do not reflect long term recommendations.    - Continue Baclofen,valium, gabapentin, oxycodone as above   - Continue PRN acetaminophen  - continue Suboxone 2-0.5, 1/2 film   -continue IV dilaudid 0.5mg  q6h PRN   -  Recommend continued physical therapy,  please try to see daily or give family/nursing staff exercises to complete   - Recommend involvement of inpatient psychology if possible and complex care given patient's distress surrounding hospitalization  - Patient would likely benefit from pain psychology evaluation post-discharge (in [facility])    We will sign off at this time.  Please contact the chronic pain service if we can be of further assistance with pain control.    Naloxone Rx at discharge?  Is patient on opioids? Yes.  1)Is dose >50MME?  Yes.  2) Is patient prescribed a benzodiazepine (w opioids)? Yes.  3)Hx of overdose?  No.  4) Hx of substance use disorder? No.  5) Opioids likely to last greater than a week after discharge? Yes.     If yes to 2 or more, prescribe naloxone at discharge.  Nasal narcan for most insured (Nasal narcan 4mg /actuation, prescribe 1 kit, instructions at [email or web address]).  For uninsured, chronic pain can work to assist in finding an option.  OTC nasal narcan now available at most pharmacies for around $45.    Interim History  There were no Acute Events Overnight. She reports sleeping well last night.  The patient is obtaining adequate pain relief on current medication regimen and feels that their pain is Somewhat controlled.     Inpatient Medications  Current Facility-Administered Medications   Medication Dose Route Frequency Provider Last Rate Last Admin    acetaminophen (TYLENOL) tablet 650 mg  650 mg Enteral tube: gastric Q6H PRN Anell Barr, MD   650 mg at 10/22/22 2123    aluminum-magnesium hydroxide-simethicone (MAALOX PLUS) 200-200-20 mg/5 mL suspension 60 mL  60 mL Oral Q6H PRN Anell Barr, MD        baclofen (LIORESAL) tablet 10 mg  10 mg Oral TID PRN Earna Coder, MD   10 mg at 11/15/22 1702    bisacodyl (DULCOLAX) suppository 10 mg  10 mg Rectal BID PRN Anell Barr, MD        buprenorphine-naloxone (SUBOXONE) 2-0.5 mg SL film 1 mg of buprenorphine  0.5 Film Sublingual TID Earna Coder, MD   1 mg of buprenorphine at 11/15/22 2139    carboxymethylcellulose sodium (REFRESH CELLUVISC) 1 % ophthalmic gel 1 drop  1 drop Both Eyes QID Dacillo-Curso, Amy, ACNP   1 drop at 11/16/22 5638    carboxymethylcellulose sodium (REFRESH CELLUVISC) 1 % ophthalmic gel 2 drop  2 drop Both Eyes QID PRN Dacillo-Curso, Amy, ACNP   2 drop at 11/01/22 1655    CHEMO CLARIFICATION ORDER   Other Continuous PRN Dacillo-Curso, Amy, ACNP diazePAM (VALIUM) tablet 2.5 mg  2.5 mg Oral Daily Kateri Plummer, MD   2.5 mg at 11/15/22 7564    And    diazePAM (VALIUM) tablet 2.5 mg  2.5 mg Oral Daily Kateri Plummer, MD   2.5 mg at 11/15/22 1450    diazePAM (VALIUM) tablet 2.5 mg  2.5 mg Oral Nightly PRN Kateri Plummer, MD        diazePAM (VALIUM) tablet 5 mg  5 mg Oral Nightly Kateri Plummer, MD   5 mg at 11/15/22 2124    diclofenac sodium (VOLTAREN) 1 % gel 2 g  2 g Topical QID Dacillo-Curso, Amy, ACNP   2 g at 11/16/22 0513    famotidine (PEPCID) tablet 20 mg  20 mg Oral Nightly Anell Barr, MD   20 mg at 11/15/22 2123  gabapentin (NEURONTIN) capsule 100 mg  100 mg Oral TID Dacillo-Curso, Amy, ACNP   100 mg at 11/15/22 2123    guaiFENesin (ROBITUSSIN) oral syrup  200 mg Oral Q4H PRN Anell Barr, MD   200 mg at 10/17/22 2344    hydrocortisone 2.5 % cream   Topical BID PRN Dacillo-Curso, Amy, ACNP   Given at 11/11/22 2223    HYDROmorphone (PF) (DILAUDID) injection 0.5 mg  0.5 mg Intravenous Q6H PRN Earna Coder, MD   0.5 mg at 11/16/22 1610    HYDROmorphone (PF) (DILAUDID) injection 0.5 mg  0.5 mg Intravenous Once Erlinda Hong, MD        lidocaine (ASPERCREME) 4 % 2 patch  2 patch Transdermal Daily Kateri Plummer, MD   2 patch at 11/15/22 0855    mirtazapine (REMERON) tablet 15 mg  15 mg Enteral tube: gastric Nightly Anell Barr, MD   15 mg at 11/12/22 2203    mucositis mixture (with lidocaine)  10 mL Mouth QID PRN Dacillo-Curso, Amy, ACNP   10 mL at 10/26/22 2146    multivitamin with folic acid 400 mcg tablet 1 tablet  1 tablet Oral Daily Anell Barr, MD   1 tablet at 11/15/22 0855    naloxegol (MOVANTIK) 12.5 mg tablet 12.5 mg  12.5 mg Oral Daily Anell Barr, MD   12.5 mg at 11/15/22 0856    nirogacestat (OGSIVEO) tablet 50 mg *Patient Supplied*  50 mg Oral BID Earna Coder, MD   50 mg at 11/15/22 1825    ondansetron (ZOFRAN) injection 4 mg  4 mg Intravenous Q12H PRN Dacillo-Curso, Amy, ACNP   4 mg at 11/16/22 9604    oxyCODONE (ROXICODONE) immediate release tablet 10 mg  10 mg Oral Q4H PRN Hemsey, Huntley Estelle, MD        Or    oxyCODONE (ROXICODONE) immediate release tablet 15 mg  15 mg Oral Q4H PRN Earna Coder, MD   15 mg at 11/16/22 0032    pantoprazole (Protonix) EC tablet 40 mg  40 mg Oral BID Anell Barr, MD   40 mg at 11/15/22 2123    phenol (CHLORASEPTIC) 1.4 % spray 2 spray  2 spray Mucous Membrane Q2H PRN Dacillo-Curso, Amy, ACNP   2 spray at 10/03/22 0138    polyethylene glycol (MIRALAX) packet 17 g  17 g Oral BID PRN Anell Barr, MD   17 g at 11/10/22 2132    promethazine (PHENERGAN) 6.5 mg in sodium chloride (NS) 0.9 % 50 mL IVPB  6.5 mg Intravenous Q6H PRN Dacillo-Curso, Amy, ACNP   Stopped at 11/15/22 2145    pseudoephedrine (SUDAFED) tablet 30 mg  30 mg Oral Daily PRN Anell Barr, MD        senna [facility]) tablet 1 tablet  1 tablet Oral BID PRN Anell Barr, MD   1 tablet at 11/15/22 2138    sodium chloride (NS) 0.9 % infusion  10 mL/hr Intravenous Continuous Dacillo-Curso, Amy, ACNP 10 mL/hr at 11/09/22 0314 10 mL/hr at 11/09/22 0314    zinc oxide-cod liver oil (DESITIN 40%) Paste   Topical Daily PRN Dacillo-Curso, Amy, ACNP   Given at 11/11/22 2223     Objective:     Vital Signs    Temp:  [35.5 ??C (95.9 ??F)-36.7 ??C (98.1 ??F)] 36.6 ??C (97.9 ??F)  Heart Rate:  [80-95] 80  Resp:  [16-18] 16  BP: (102-114)/(55-66) 114/66  MAP (mmHg):  [69-80]  80  SpO2:  [98 %-100 %] 100 %    Physical Exam     GENERAL:  Well developed, well-nourished female in no distress.  HEAD/NECK:    Normocephalic/atraumatic. NGT present.  CARDIOVASCULAR:   WWP, regular tate  LUNGS:   Normal work of breathing, [employer]  ABDOMEN  Soft, non-distended.  EXTREMITIES:  Warm and well-perfused.  NEUROLOGIC:    The patient was alert and oriented times four with normal language, attention, cognition and memory. Cranial nerve exam was grossly normal.    MUSCULOSKELETAL: SKIN:  No obvious rashes lesions or erythema  PSY:  Appropriate affect and mood.    Test Results    Lab Results   Component Value Date    CREATININE 0.43 (L) 11/16/2022     Lab Results   Component Value Date    ALKPHOS 113 11/16/2022    BILITOT <0.2 (L) 11/16/2022    BILIDIR 0.10 10/29/2022    PROT 5.7 11/16/2022    ALBUMIN 3.0 (L) 11/16/2022    ALT 28 11/16/2022    AST 23 11/16/2022     Problem List    Principal Problem:    Other acute postprocedural pain  Active Problems:    [name]    Intestinal polyps    [name]    Neoplasm related pain    Physical deconditioning    History of colectomy    Intractable abdominal pain    Generalized anxiety disorder with panic attacks    SBO (small bowel obstruction) (CMS-HCC)    Small intestinal bacterial overgrowth (SIBO)    [place]y [place], DO   Pain Fellow   PGY-5

## 2022-11-16 NOTE — Unmapped (Signed)
No acute changes overnight. Pt with c/o ABD and Back pain. PRN oxycodone an dilaudid given. PRN nausea given also. See EMAR. Pt remains drowsy intermittently drowsy but arousable easily. Pt is indedepent in bd. Able to turn self. No acute distress noted. VSS. Bed remains locked in lowest position. Call light in reach.   Problem: Adult Inpatient Plan of Care  Goal: Plan of Care Review  Outcome: Progressing  Goal: Patient-Specific Goal (Individualized)  Outcome: Progressing  Goal: Absence of Hospital-Acquired Illness or Injury  Outcome: Progressing  Intervention: Identify and Manage Fall Risk  Recent Flowsheet Documentation  Taken 11/15/2022 2000 by Arlyce Dice, RN  Safety Interventions:   environmental modification   fall reduction program maintained   lighting adjusted for tasks/safety   low bed   family at bedside  Intervention: Prevent Skin Injury  Recent Flowsheet Documentation  Taken 11/15/2022 2000 by Arlyce Dice, RN  Positioning for Skin: Supine/Back  Intervention: Prevent and Manage VTE (Venous Thromboembolism) Risk  Recent Flowsheet Documentation  Taken 11/15/2022 2200 by Omari Koslosky, Prince Rome, RN  Anti-Embolism Device Type: SCD, Knee  Taken 11/15/2022 2000 by Arlyce Dice, RN  Anti-Embolism Device Type: SCD, Knee  Intervention: Prevent Infection  Recent Flowsheet Documentation  Taken 11/15/2022 2000 by Arlyce Dice, RN  Infection Prevention:   single patient room provided   rest/sleep promoted   hand hygiene promoted  Goal: Optimal Comfort and Wellbeing  Outcome: Progressing  Goal: Readiness for Transition of Care  Outcome: Progressing  Goal: Rounds/Family Conference  Outcome: Progressing     Problem: Adult Inpatient Plan of Care  Goal: Plan of Care Review  Outcome: Progressing  Goal: Patient-Specific Goal (Individualized)  Outcome: Progressing  Goal: Absence of Hospital-Acquired Illness or Injury  Outcome: Progressing  Intervention: Identify and Manage Fall Risk  Recent Flowsheet Documentation  Taken 11/15/2022 2000 by Arlyce Dice, RN  Safety Interventions:   environmental modification   fall reduction program maintained   lighting adjusted for tasks/safety   low bed   family at bedside  Intervention: Prevent Skin Injury  Recent Flowsheet Documentation  Taken 11/15/2022 2000 by Arlyce Dice, RN  Positioning for Skin: Supine/Back  Intervention: Prevent and Manage VTE (Venous Thromboembolism) Risk  Recent Flowsheet Documentation  Taken 11/15/2022 2200 by Arlyce Dice, RN  Anti-Embolism Device Type: SCD, Knee  Taken 11/15/2022 2000 by Arlyce Dice, RN  Anti-Embolism Device Type: SCD, Knee  Intervention: Prevent Infection  Recent Flowsheet Documentation  Taken 11/15/2022 2000 by Arlyce Dice, RN  Infection Prevention:   single patient room provided   rest/sleep promoted   hand hygiene promoted  Goal: Optimal Comfort and Wellbeing  Outcome: Progressing  Goal: Readiness for Transition of Care  Outcome: Progressing  Goal: Rounds/Family Conference  Outcome: Progressing

## 2022-11-16 NOTE — Unmapped (Signed)
Hospital Medicine Progress Note    Assessment/Plan:    Principal Problem:    Other acute postprocedural pain  Active Problems:    [name]    Intestinal polyps    [name]    Neoplasm related pain    Physical deconditioning    History of colectomy    Intractable abdominal pain    Generalized anxiety disorder with panic attacks    SBO (small bowel obstruction) (CMS-HCC)    Small intestinal bacterial overgrowth (SIBO)      [name] [name] is a 22 y.o. y/o female that presents to [facility] with Other acute postprocedural pain.    Acute on chronic pain/acute back pain secondary to MSK injury/abdominal desmoid fibromatosis:  Pain continues to be limiting, and in multiple places including low back since strenous exam last week, legs, abdomen. No obvious reversible triggers, suspect relates primarily to her recent muscular issues and Gardner's syndrome.  Continue to take hydromorphone 5-6x daily most days, with some use of PO oxycodone.  As discussed with patient and Pain team, this will be limiting for transition and would prefer to maximize oral regimen to decrease IV need as we are further away from the triggering events.  Increased suboxone 9/6, continue oral oxycodone as needed and decrease frequency of hydromorphone from 0.5 mg every 4 hours to every 6 hours.  Appreciate Chronic Pain evaluation continued assistance. Overall pain is stable on current regimen. Working toward bein gable to transfer to AIR.   -Anesthesia pain consult following  -Continue Suboxone 1/0.25 mg tid  -Decreased hydromorphone IV to 0.5 mg every 6 hours if needed  -Continue to encourage use of oxycodone 10 to 15 mg every 4 hours as needed over IV  -Continue lidocaine patches  -Continue gabapentin    Muscle spasm and stiffness/difficulty with ambulation:  Initial presenting complaint.  Has improved from admission with start of diazepam but continues to have some symptoms. Have tapered and changed baclofen to prn without change in symptoms.  She continues to note difficulty with gait, especially with knees and feeling of buckling. Diagnosis remains unclear.  Workup included MRI of spine and brain, EMG and autoimmune myopathy panel all negative. Paraneoplastic antibody panel notable for a slightly elevated PQ type calcium channel antibody of unclear significance.  Neurology and PM&R do not feel this is consistent with stiff man syndrome especially in light of EMG findings. Plan to continue treatment with diazepam with baclofen if needed and aggressive PT/OT.  -Discussed with PM&R who plan to reassess gait for need for additional support with therapy.   -Continue baclofen 10 mg 3 times daily as needed for spasms, can restart if patient notes worsening symptoms  -Continue diazepam at current doses  -Continue gabapentin  -R knee patellar brace to assist with ambulation  -PT/OT recommending 5 times high intensity,, working towards AIR    Nausea/malnutrition/lieus:  Replaced ND tube functioning well.  Having regular BMs and ileus appears to be well managed. Previously has required TPN. Intake has been rather limited.   -Nutrition consulted for cyclic regimen, decreased total calories via 47ml/hr x13 hours, started 9/7  -Continue bowel regimen  -Continue naloxegol    [name]/desmoid fibromatosis:  Follows with Dr. [name] of [facility] oncology.  Had recent triamcinolone and bupivacaine injection of the RUE and left chest tumor by IR on 7/18.  Takes nirogacestat 150 mg twice daily but this has been held intermittently due to other medical reasons.  Plan was to start 50 mg twice daily and  titrate up by 50 mg each week until back at usual dose.  LFTs have been mildly elevated but overall improved and without a clear etiology.  -Continue nirogacestat at 50 mg twice daily (started 9/3, will increase dose 9/10)  -Titrate up by week to usual 150 mg bid     LFT elevation:  Has had intermittent elevations of ALT, AST and alkaline phosphatase over the hospitalization. Has continued to improve, without a clear etiology.  Did previously receive TPN, cefazolin and fluconazole which all could have contributed.  Had a liver ultrasound on 8/15 negative.  Now back on her usual [name] treatment.  -Continue to monitor    Generalized anxiety disorder/depression:  Previously on lorazepam which has been changed to diazepam after discussion with psychiatry for stiffness. Psychiatry has been following and recommends no changes today on my discussion.  -Continue diazepam at current dosing  -Continue mirtazapine    Right>left leg swelling:  Noted mild non pitting edema of area below right knee and foot on 9/5.  Knee exam without effusion, no laxity.  XR with normal alignment. PVL negative. Stable  -Continue to monitor    Enterobacteriaceae GNR/Candida infection related to tunneled line:  Line now removed.  Repeat cultures negative.  Underwent TEE x 2 without evidence of vegetations.  Optho evaluation on 8/15 without evidence of infection.  Completed treatment with cephalosporin and fluconazole on 8/31.  -No further treatment at this time    Iron deficiency anemia:  Received 1 g ferumoxytol during this hospitalization.  Of note, has a history of Fe hypersensitivity reactions and evaluated by [facility].  Hb and MCV  Received cetirizine for allergic symptoms but now stopped.   -No further treatment    FEN: Regular Diet as tolerated. Cycling Tfs over 13 hours to encourage intake during the day.   PPx:  Patient declines chemical prophylaxis due to bleeding risk.  Disposition: Inpatient, working towards AIR discharge  Code: FULL     [place] Service, MD  Please page [place] [phone] with questions.      I personally spent 55 minutes face-to-face and non-face-to-face in the care of this patient, which includes all pre, intra, and post visit time on the date of service.  All documented time was specific to the E/M visit and does not include any procedures that may have been performed.    ___________________________________________________________________    Subjective:    Tolerating TF at current rate, asking about cycling feeds. Still feels R knee is slightly swollen and unstable when she walks. Pain is essentially stable. No acute interval events.     Objective:    Vital signs in last 24 hours:  Temp:  [35.5 ??C (95.9 ??F)-36.8 ??C (98.2 ??F)] 36.7 ??C (98.1 ??F)  Heart Rate:  [88-108] 93  Resp:  [18] 18  BP: (94-121)/(50-73) 104/64  MAP (mmHg):  [63-87] 72  SpO2:  [95 %-100 %] 100 %    Intake/Output last 3 shifts:  I/O last 3 completed shifts:  In: -   Out: 2050 [Urine:2050]    Physical Exam:  GEN: In bed, occasionally anxious  EYES: Anicteric  ENT: OP moist, NG in place  CV:  RRR, normal S1, S2, no murmur  PULM:  Moderate effort, no crackles or wheezes  ABD: Continued tenderness to palpation without rebound or guard, good bowel sound  Extremities: stable stiffness in BLE.  Bilateral knees without effusions.     Medications:  Scheduled Meds:   buprenorphine-naloxone  0.5 Film Sublingual TID  carboxymethylcellulose sodium  1 drop Both Eyes QID    diazePAM  2.5 mg Oral Daily    And    diazePAM  2.5 mg Oral Daily    diazePAM  5 mg Oral Nightly    diclofenac sodium  2 g Topical QID    famotidine  20 mg Oral Nightly    gabapentin  100 mg Oral TID    HYDROmorphone  0.5 mg Intravenous Once    lidocaine  2 patch Transdermal Daily    mirtazapine  15 mg Enteral tube: gastric Nightly    multivitamins (ADULT)  1 tablet Oral Daily    naloxegol  12.5 mg Oral Daily    nirogacestat  50 mg Oral BID    pantoprazole  40 mg Oral BID     Continuous Infusions:   Chemo Clarification Order      sodium chloride 10 mL/hr (11/09/22 0314)     PRN Meds:.acetaminophen, alum-mag-simeth, baclofen, bisacodyl, carboxymethylcellulose sodium, Chemo Clarification Order, diazePAM, guaiFENesin, hydrocortisone, HYDROmorphone, lidocaine-diphenhydrAMINE-aluminum-magnesium, ondansetron, oxyCODONE **OR** oxyCODONE, phenol, polyethylene glycol, promethazine, pseudoephedrine, senna, zinc oxide-cod liver oil    Labs/Studies:  Labs/Studies reviewed by me.  Notable for values below.    Recent Labs     Units 11/11/22  0620 11/13/22  1039 11/15/22  0936   WBC 10*9/L 5.2 5.5 4.8   RBC 10*12/L 3.49* 3.55* 3.39*   HGB g/dL 9.8* 16.1* 9.5*   HCT % 29.0* 29.5* 28.0*   MCV fL 82.9 83.0 82.5   MCH pg 28.0 28.2 27.9   MCHC g/dL 09.6 04.5 40.9   RDW % 17.1* 17.3* 16.8*   PLT 10*9/L 155 157 151   MPV fL 10.3 10.3 9.8       Recent Labs     Units 11/13/22  1039 11/14/22  0536 11/15/22  0538   NA mmol/L 141 140 139   K mmol/L 3.9 3.7 3.6   CL mmol/L 103 103 103   CO2 mmol/L 33.0* 32.0* 32.0*   BUN mg/dL 8* 7* 7*   CREATININE mg/dL 8.11* 9.14* 7.82*   GLU mg/dL 86 956 213       Recent Labs     Units 11/15/22  0538   ALBUMIN g/dL 2.9*   ALT U/L 33   AST U/L 24   ALKPHOS U/L 125*   BILITOT mg/dL 0.2*   PROT g/dL 5.7

## 2022-11-16 NOTE — Unmapped (Signed)
Hospital Medicine Progress Note    Assessment/Plan:    Principal Problem:    Other acute postprocedural pain  Active Problems:    [name]    Intestinal polyps    [name]    Neoplasm related pain    Physical deconditioning    History of colectomy    Intractable abdominal pain    Generalized anxiety disorder with panic attacks    SBO (small bowel obstruction) (CMS-HCC)    Small intestinal bacterial overgrowth (SIBO)      [name] [name] is a 22 y.o. y/o female that presents to [facility] with Other acute postprocedural pain.    Acute on chronic pain/acute back pain secondary to MSK injury/abdominal desmoid fibromatosis:  Pain continues to be limiting, and in multiple places including low back since strenous exam last week, legs, abdomen. No obvious reversible triggers, suspect relates primarily to her recent muscular issues and Gardner's syndrome.  Continue to take hydromorphone 5-6x daily most days, with some use of PO oxycodone.  As discussed with patient and Pain team, this will be limiting for transition and would prefer to maximize oral regimen to decrease IV need as we are further away from the triggering events.  Increased suboxone 9/6, continue oral oxycodone as needed and decrease frequency of hydromorphone from 0.5 mg every 4 hours to every 6 hours.  Appreciate Chronic Pain evaluation continued assistance. Overall pain is stable on current regimen. Working toward bein gable to transfer to AIR.   -Anesthesia pain consult following  -Continue Suboxone 1/0.25 mg tid  -Continue hydromorphone IV 0.5 mg every 6 hours if needed  -Continue to encourage use of oxycodone 10 to 15 mg every 4 hours as needed over IV  -Continue lidocaine patches  -Continue gabapentin    Muscle spasm and stiffness/difficulty with ambulation:  Initial presenting complaint.  Has improved from admission with start of diazepam but continues to have some symptoms. Have tapered and changed baclofen to prn without change in symptoms.  She continues to note difficulty with gait, especially with knees and feeling of buckling. Diagnosis remains unclear.  Workup included MRI of spine and brain, EMG and autoimmune myopathy panel all negative. Paraneoplastic antibody panel notable for a slightly elevated PQ type calcium channel antibody of unclear significance.  Neurology and PM&R do not feel this is consistent with stiff man syndrome especially in light of EMG findings. Plan to continue treatment with diazepam with baclofen if needed and aggressive PT/OT.  -Discussed with PM&R who plan to reassess gait for need for additional support with therapy.   -Continue baclofen 10 mg 3 times daily as needed for spasms, can restart if patient notes worsening symptoms  -Continue diazepam at current doses  -Continue gabapentin  -Knee patellar braces to assist with ambulation  -PT/OT recommending 5 times high intensity, working towards AIR    Nausea/malnutrition/lieus:  Replaced ND tube functioning well.  Having regular BMs and ileus appears to be well managed. Previously has required TPN. Intake has been rather limited.   -Nutrition consulted for cyclic regimen, decreased total calories via 37ml/hr x13 hours, started 9/7  -Continue bowel regimen  -Continue naloxegol    [name]/desmoid fibromatosis:  Follows with Dr. [name] of [facility] oncology.  Had recent triamcinolone and bupivacaine injection of the RUE and left chest tumor by IR on 7/18.  Takes nirogacestat 150 mg twice daily but this has been held intermittently due to other medical reasons.  Plan was to start 50 mg twice daily and titrate up  by 50 mg each week until back at usual dose.  LFTs have been mildly elevated but overall improved and without a clear etiology.  -Continue nirogacestat at 50 mg twice daily (started 9/3, will increase dose 9/10)  -Titrate up by week to usual 150 mg bid     LFT elevation:  Has had intermittent elevations of ALT, AST and alkaline phosphatase over the hospitalization.  Has continued to improve, without a clear etiology.  Did previously receive TPN, cefazolin and fluconazole which all could have contributed.  Had a liver ultrasound on 8/15 negative.  Now back on her usual [name] treatment.  -Continue to monitor    Generalized anxiety disorder/depression:  Previously on lorazepam which has been changed to diazepam after discussion with psychiatry for stiffness. Psychiatry has been following and recommends no changes today on my discussion.  -Continue diazepam at current dosing  -Continue mirtazapine    Right>left leg swelling:  Noted mild non pitting edema of area below right knee and foot on 9/5.  Knee exam without effusion, no laxity.  XR with normal alignment. PVL negative. Stable  -Continue to monitor    Enterobacteriaceae GNR/Candida infection related to tunneled line:  Line now removed.  Repeat cultures negative.  Underwent TEE x 2 without evidence of vegetations.  Optho evaluation on 8/15 without evidence of infection.  Completed treatment with cephalosporin and fluconazole on 8/31.  -No further treatment at this time    Iron deficiency anemia:  Received 1 g ferumoxytol during this hospitalization.  Of note, has a history of Fe hypersensitivity reactions and evaluated by [facility].  Hb and MCV  Received cetirizine for allergic symptoms but now stopped.   -No further treatment    FEN: Regular Diet as tolerated. Cycling Tfs over 13 hours to encourage intake during the day.   PPx:  Patient declines chemical prophylaxis due to bleeding risk.  Disposition: Inpatient, working towards AIR discharge  Code: FULL     [place] Service, MD  Please page [place] [phone] with questions.      I personally spent 35 minutes face-to-face and non-face-to-face in the care of this patient, which includes all pre, intra, and post visit time on the date of service.  All documented time was specific to the E/M visit and does not include any procedures that may have been performed.    ___________________________________________________________________    Subjective:    Tolerating TF at current rate. Took slightly more PO yesterday with feeds off during the day. Pain is essentially stable. No acute interval events. Hasn't gotten a knee brace from CD yet.     Objective:    Vital signs in last 24 hours:  Temp:  [36.4 ??C (97.5 ??F)-36.7 ??C (98.1 ??F)] 36.7 ??C (98.1 ??F)  Heart Rate:  [80-95] 93  Resp:  [16-18] 18  BP: (104-116)/(59-68) 110/68  MAP (mmHg):  [72-80] 80  SpO2:  [99 %-100 %] 100 %    Intake/Output last 3 shifts:  I/O last 3 completed shifts:  In: -   Out: 2250 [Urine:2250]    Physical Exam:  GEN: In bed, occasionally anxious  EYES: Anicteric  ENT: OP moist, NG in place  CV:  RRR, normal S1, S2, no murmur  PULM:  Moderate effort, no crackles or wheezes  ABD: Continued tenderness to palpation without rebound or guard, good bowel sound  Extremities: stable stiffness in BLE.  Bilateral knees without effusions.     Medications:  Scheduled Meds:   buprenorphine-naloxone  0.5 Film Sublingual  TID    carboxymethylcellulose sodium  1 drop Both Eyes QID    diazePAM  2.5 mg Oral Daily    And    diazePAM  2.5 mg Oral Daily    diazePAM  5 mg Oral Nightly    diclofenac sodium  2 g Topical QID    famotidine  20 mg Oral Nightly    gabapentin  100 mg Oral TID    HYDROmorphone  0.5 mg Intravenous Once    lidocaine  2 patch Transdermal Daily    mirtazapine  15 mg Enteral tube: gastric Nightly    multivitamins (ADULT)  1 tablet Oral Daily    naloxegol  12.5 mg Oral Daily    nirogacestat  50 mg Oral BID    pantoprazole  40 mg Oral BID     Continuous Infusions:   Chemo Clarification Order      sodium chloride 10 mL/hr (11/09/22 0314)     PRN Meds:.acetaminophen, alum-mag-simeth, baclofen, bisacodyl, carboxymethylcellulose sodium, Chemo Clarification Order, diazePAM, guaiFENesin, hydrocortisone, HYDROmorphone, lidocaine-diphenhydrAMINE-aluminum-magnesium, ondansetron, oxyCODONE **OR** oxyCODONE, phenol, polyethylene glycol, promethazine, pseudoephedrine, senna, zinc oxide-cod liver oil    Labs/Studies:  Labs/Studies reviewed by me.  Notable for values below.    Recent Labs     Units 11/11/22  0620 11/13/22  1039 11/15/22  0936   WBC 10*9/L 5.2 5.5 4.8   RBC 10*12/L 3.49* 3.55* 3.39*   HGB g/dL 9.8* 16.1* 9.5*   HCT % 29.0* 29.5* 28.0*   MCV fL 82.9 83.0 82.5   MCH pg 28.0 28.2 27.9   MCHC g/dL 09.6 04.5 40.9   RDW % 17.1* 17.3* 16.8*   PLT 10*9/L 155 157 151   MPV fL 10.3 10.3 9.8       Recent Labs     Units 11/14/22  0536 11/15/22  0538 11/16/22  0513   NA mmol/L 140 139 141   K mmol/L 3.7 3.6 3.7   CL mmol/L 103 103 106   CO2 mmol/L 32.0* 32.0* 30.0   BUN mg/dL 7* 7* 6*   CREATININE mg/dL 8.11* 9.14* 7.82*   GLU mg/dL 956 213 99       Recent Labs     Units 11/16/22  0513   ALBUMIN g/dL 3.0*   ALT U/L 28   AST U/L 23   ALKPHOS U/L 113   BILITOT mg/dL <0.8*   PROT g/dL 5.7

## 2022-11-16 NOTE — Unmapped (Addendum)
Patient alert and oriented x4 this shift. VSS on 2L . Corpak in left nare with cyclic tube feeds starting from 1900-0800, osmolite 1.5 going at 40. Tube feeds were stopped as ordered. PRN oxy and dilaudid were given for pain and phenergan for nausea with relief. Family at bedside. Takes meds with apple sauce. No other complaints at this time. Bed alarm on and audible, call bell within reach, bed in lowest locked position.     Problem: Adult Inpatient Plan of Care  Goal: Plan of Care Review  Outcome: Ongoing - Unchanged  Flowsheets (Taken 11/16/2022 1534)  Plan of Care Reviewed With: patient  Goal: Patient-Specific Goal (Individualized)  Outcome: Ongoing - Unchanged  Goal: Absence of Hospital-Acquired Illness or Injury  Outcome: Ongoing - Unchanged  Intervention: Identify and Manage Fall Risk  Recent Flowsheet Documentation  Taken 11/16/2022 0730 by Ritta Slot, RN  Safety Interventions:   environmental modification   fall reduction program maintained   lighting adjusted for tasks/safety   low bed   family at bedside  Intervention: Prevent Skin Injury  Recent Flowsheet Documentation  Taken 11/16/2022 0730 by Ritta Slot, RN  Positioning for Skin: Supine/Back  Intervention: Prevent and Manage VTE (Venous Thromboembolism) Risk  Recent Flowsheet Documentation  Taken 11/16/2022 1200 by Ritta Slot, RN  Anti-Embolism Device Type: SCD, Knee  Taken 11/16/2022 1000 by Ritta Slot, RN  Anti-Embolism Device Type: SCD, Knee  Taken 11/16/2022 0800 by Ritta Slot, RN  Anti-Embolism Device Type: SCD, Knee  Taken 11/16/2022 0730 by Ritta Slot, RN  Anti-Embolism Device Type: SCD, Knee  Intervention: Prevent Infection  Recent Flowsheet Documentation  Taken 11/16/2022 0730 by Ritta Slot, RN  Infection Prevention:   cohorting utilized   environmental surveillance performed   hand hygiene promoted   rest/sleep promoted  Goal: Optimal Comfort and Wellbeing  Outcome: Ongoing - Unchanged  Goal: Readiness for Transition of Care  Outcome: Ongoing - Unchanged  Goal: Rounds/Family Conference  Outcome: Ongoing - Unchanged     Problem: Latex Allergy  Goal: Absence of Allergy Symptoms  Outcome: Ongoing - Unchanged     Problem: Fall Injury Risk  Goal: Absence of Fall and Fall-Related Injury  Outcome: Ongoing - Unchanged  Intervention: Promote Injury-Free Environment  Recent Flowsheet Documentation  Taken 11/16/2022 0730 by Ritta Slot, RN  Safety Interventions:   environmental modification   fall reduction program maintained   lighting adjusted for tasks/safety   low bed   family at bedside     Problem: Skin Injury Risk Increased  Goal: Skin Health and Integrity  Outcome: Ongoing - Unchanged  Intervention: Optimize Skin Protection  Recent Flowsheet Documentation  Taken 11/16/2022 0730 by Ritta Slot, RN  Pressure Reduction Techniques:   frequent weight shift encouraged   heels elevated off bed     Problem: Self-Care Deficit  Goal: Improved Ability to Complete Activities of Daily Living  Outcome: Ongoing - Unchanged     Problem: Comorbidity Management  Goal: Blood Glucose Levels Within Targeted Range  Outcome: Ongoing - Unchanged  Goal: Blood Pressure in Desired Range  Outcome: Ongoing - Unchanged     Problem: Nausea and Vomiting  Goal: Nausea and Vomiting Relief  Outcome: Ongoing - Unchanged     Problem: Infection  Goal: Absence of Infection Signs and Symptoms  Outcome: Ongoing - Unchanged  Intervention: Prevent or Manage Infection  Recent Flowsheet Documentation  Taken 11/16/2022 0730 by Ritta Slot, RN  Infection Management: aseptic technique maintained     Problem: Pain Acute  Goal:  Optimal Pain Control and Function  Outcome: Ongoing - Unchanged     Problem: Malnutrition  Goal: Improved Nutritional Intake  Outcome: Ongoing - Unchanged

## 2022-11-17 LAB — COMPREHENSIVE METABOLIC PANEL
ALBUMIN: 2.9 g/dL — ABNORMAL LOW (ref 3.4–5.0)
ALKALINE PHOSPHATASE: 101 U/L (ref 46–116)
ALT (SGPT): 26 U/L (ref 10–49)
ANION GAP: 8 mmol/L (ref 5–14)
AST (SGOT): 23 U/L (ref <=34)
BILIRUBIN TOTAL: 0.2 mg/dL — ABNORMAL LOW (ref 0.3–1.2)
BLOOD UREA NITROGEN: 6 mg/dL — ABNORMAL LOW (ref 9–23)
BUN / CREAT RATIO: 14
CALCIUM: 9 mg/dL (ref 8.7–10.4)
CHLORIDE: 107 mmol/L (ref 98–107)
CO2: 28 mmol/L (ref 20.0–31.0)
CREATININE: 0.44 mg/dL — ABNORMAL LOW
EGFR CKD-EPI (2021) FEMALE: >90 mL/min/{1.73_m2} (ref >=60)
GLUCOSE RANDOM: 120 mg/dL (ref 70–179)
POTASSIUM: 3.7 mmol/L (ref 3.4–4.8)
PROTEIN TOTAL: 5.5 g/dL — ABNORMAL LOW (ref 5.7–8.2)
SODIUM: 143 mmol/L (ref 135–145)

## 2022-11-17 LAB — CBC
HEMATOCRIT: 30.8 % — ABNORMAL LOW (ref 34.0–44.0)
HEMOGLOBIN: 10.6 g/dL — ABNORMAL LOW (ref 11.3–14.9)
MEAN CORPUSCULAR HEMOGLOBIN CONC: 34.5 g/dL (ref 32.0–36.0)
MEAN CORPUSCULAR HEMOGLOBIN: 28.4 pg (ref 25.9–32.4)
MEAN CORPUSCULAR VOLUME: 82.4 fL (ref 77.6–95.7)
MEAN PLATELET VOLUME: 9.5 fL (ref 6.8–10.7)
PLATELET COUNT: 183 10*9/L (ref 150–450)
RED BLOOD CELL COUNT: 3.74 10*12/L — ABNORMAL LOW (ref 3.95–5.13)
RED CELL DISTRIBUTION WIDTH: 17 % — ABNORMAL HIGH (ref 12.2–15.2)
WBC ADJUSTED: 6.4 10*9/L (ref 3.6–11.2)

## 2022-11-17 MED ADMIN — pantoprazole (Protonix) EC tablet 40 mg: 40 mg | ORAL | @ 02:00:00

## 2022-11-17 MED ADMIN — pantoprazole (Protonix) EC tablet 40 mg: 40 mg | ORAL | @ 13:00:00

## 2022-11-17 MED ADMIN — promethazine (PHENERGAN) 6.5 mg in sodium chloride (NS) 0.9 % 50 mL IVPB: 6.5 mg | INTRAVENOUS | @ 23:00:00

## 2022-11-17 MED ADMIN — buprenorphine-naloxone (SUBOXONE) 2-0.5 mg SL film 1 mg of buprenorphine: .5 | SUBLINGUAL | @ 18:00:00

## 2022-11-17 MED ADMIN — buprenorphine-naloxone (SUBOXONE) 2-0.5 mg SL film 1 mg of buprenorphine: .5 | SUBLINGUAL | @ 13:00:00

## 2022-11-17 MED ADMIN — diclofenac sodium (VOLTAREN) 1 % gel 2 g: 2 g | TOPICAL | @ 02:00:00

## 2022-11-17 MED ADMIN — nirogacestat (OGSIVEO) tablet 50 mg *Patient Supplied*: 50 mg | ORAL | @ 13:00:00 | Stop: 2022-11-18

## 2022-11-17 MED ADMIN — senna (SENOKOT) tablet 1 tablet: 1 | ORAL | @ 02:00:00

## 2022-11-17 MED ADMIN — diazePAM (VALIUM) tablet 5 mg: 5 mg | ORAL | @ 02:00:00

## 2022-11-17 MED ADMIN — gabapentin (NEURONTIN) capsule 100 mg: 100 mg | ORAL | @ 02:00:00

## 2022-11-17 MED ADMIN — HYDROmorphone (PF) (DILAUDID) injection 0.5 mg: .5 mg | INTRAVENOUS | @ 20:00:00 | Stop: 2022-11-24

## 2022-11-17 MED ADMIN — diazePAM (VALIUM) tablet 2.5 mg: 2.5 mg | ORAL | @ 13:00:00

## 2022-11-17 MED ADMIN — buprenorphine-naloxone (SUBOXONE) 2-0.5 mg SL film 1 mg of buprenorphine: .5 | SUBLINGUAL | @ 02:00:00

## 2022-11-17 MED ADMIN — ondansetron (ZOFRAN) injection 4 mg: 4 mg | INTRAVENOUS | @ 18:00:00

## 2022-11-17 MED ADMIN — diazePAM (VALIUM) tablet 2.5 mg: 2.5 mg | ORAL | @ 18:00:00

## 2022-11-17 MED ADMIN — ondansetron (ZOFRAN) injection 4 mg: 4 mg | INTRAVENOUS | @ 02:00:00

## 2022-11-17 MED ADMIN — multivitamin with folic acid 400 mcg tablet 1 tablet: 1 | ORAL | @ 13:00:00

## 2022-11-17 MED ADMIN — HYDROmorphone (PF) (DILAUDID) injection 0.5 mg: .5 mg | INTRAVENOUS | @ 02:00:00 | Stop: 2022-11-17

## 2022-11-17 MED ADMIN — carboxymethylcellulose sodium (REFRESH CELLUVISC) 1 % ophthalmic gel 1 drop: 1 [drp] | OPHTHALMIC | @ 02:00:00

## 2022-11-17 MED ADMIN — gabapentin (NEURONTIN) capsule 100 mg: 100 mg | ORAL | @ 13:00:00

## 2022-11-17 MED ADMIN — oxyCODONE (ROXICODONE) immediate release tablet 15 mg: 15 mg | ORAL | @ 18:00:00 | Stop: 2022-11-28

## 2022-11-17 MED ADMIN — gabapentin (NEURONTIN) capsule 100 mg: 100 mg | ORAL | @ 18:00:00

## 2022-11-17 MED ADMIN — HYDROmorphone (PF) (DILAUDID) injection 0.5 mg: .5 mg | INTRAVENOUS | @ 10:00:00 | Stop: 2022-11-17

## 2022-11-17 MED ADMIN — famotidine (PEPCID) tablet 20 mg: 20 mg | ORAL | @ 02:00:00

## 2022-11-17 NOTE — Unmapped (Signed)
Alert and oriented,vitals stable on 2L.Received prn IV pain meds.and nausea meds.with good effect.Tolerated tube feeding via Corpak tube. No vomiting reported. Has been sleeping in bed. Family member at bedside.Will cont.POC.    Problem: Adult Inpatient Plan of Care  Goal: Plan of Care Review  Outcome: Progressing  Goal: Patient-Specific Goal (Individualized)  Outcome: Progressing  Flowsheets (Taken 11/17/2022 0103)  Patient/Family-Specific Goals (Include Timeframe): Pt.will have no falls and report  adequate pain control this shift.  Individualized Care Needs: Monitor vitals/labs.,falls prec.,pain mgt.,prn meds.TF via corpack  Anxieties, Fears or Concerns: Pain/nausea  Goal: Absence of Hospital-Acquired Illness or Injury  Outcome: Progressing  Intervention: Identify and Manage Fall Risk  Recent Flowsheet Documentation  Taken 11/16/2022 2200 by Debara Pickett, RN  Safety Interventions:   family at bedside   fall reduction program maintained   environmental modification   lighting adjusted for tasks/safety   commode/urinal/bedpan at bedside   enteral feeding safety   nonskid shoes/slippers when out of bed  Goal: Optimal Comfort and Wellbeing  Outcome: Progressing  Goal: Readiness for Transition of Care  Outcome: Progressing  Goal: Rounds/Family Conference  Outcome: Progressing     Problem: Latex Allergy  Goal: Absence of Allergy Symptoms  Outcome: Progressing     Problem: Fall Injury Risk  Goal: Absence of Fall and Fall-Related Injury  Outcome: Progressing  Intervention: Promote Scientist, clinical (histocompatibility and immunogenetics) Documentation  Taken 11/16/2022 2200 by Debara Pickett, RN  Safety Interventions:   family at bedside   fall reduction program maintained   environmental modification   lighting adjusted for tasks/safety   commode/urinal/bedpan at bedside   enteral feeding safety   nonskid shoes/slippers when out of bed     Problem: Skin Injury Risk Increased  Goal: Skin Health and Integrity  Outcome: Progressing Problem: Self-Care Deficit  Goal: Improved Ability to Complete Activities of Daily Living  Outcome: Progressing     Problem: Comorbidity Management  Goal: Blood Glucose Levels Within Targeted Range  Outcome: Progressing  Goal: Blood Pressure in Desired Range  Outcome: Progressing     Problem: Nausea and Vomiting  Goal: Nausea and Vomiting Relief  Outcome: Progressing     Problem: Infection  Goal: Absence of Infection Signs and Symptoms  Outcome: Progressing     Problem: Pain Acute  Goal: Optimal Pain Control and Function  Outcome: Progressing     Problem: Malnutrition  Goal: Improved Nutritional Intake  Outcome: Progressing

## 2022-11-17 NOTE — Unmapped (Signed)
A/O x 4, afebrile, VSS on 2L [place], PRN meds given for pain and nausea (see MAR). PICC clean, dry, intact, and flushed per protocol. VTE: SCDs. Falls precautions maintained. Call bell and bedside table within reach, bed in lowest position and locked. No falls or injuries noted this shift. Will continue with plan of care.    Problem: Adult Inpatient Plan of Care  Goal: Plan of Care Review  Outcome: Progressing  Goal: Patient-Specific Goal (Individualized)  Outcome: Progressing  Flowsheets (Taken 11/17/2022 1021)  Patient/Family-Specific Goals (Include Timeframe): Free from falls or injuries through discharge  Individualized Care Needs: Falls, labs, VS, pain/nausea, NG tube  Anxieties, Fears or Concerns: pain/nausea  Goal: Absence of Hospital-Acquired Illness or Injury  Outcome: Progressing  Intervention: Prevent and Manage VTE (Venous Thromboembolism) Risk  Recent Flowsheet Documentation  Taken 11/17/2022 1000 by Doree Barthel, RN  Anti-Embolism Device Type: SCD, Knee  Anti-Embolism Intervention: On  Anti-Embolism Device Location: BLE  Taken 11/17/2022 0800 by Doree Barthel, RN  Anti-Embolism Device Type: SCD, Knee  Anti-Embolism Intervention: On  Anti-Embolism Device Location: BLE  Taken 11/17/2022 0702 by Doree Barthel, RN  Anti-Embolism Device Type: SCD, Knee  Anti-Embolism Intervention: On  Anti-Embolism Device Location: BLE  Goal: Optimal Comfort and Wellbeing  Outcome: Progressing  Goal: Readiness for Transition of Care  Outcome: Progressing  Goal: Rounds/Family Conference  Outcome: Progressing     Problem: Fall Injury Risk  Goal: Absence of Fall and Fall-Related Injury  Outcome: Progressing     Problem: Nausea and Vomiting  Goal: Nausea and Vomiting Relief  Outcome: Progressing     Problem: Pain Acute  Goal: Optimal Pain Control and Function  Outcome: Progressing

## 2022-11-18 MED ADMIN — promethazine (PHENERGAN) 6.5 mg in sodium chloride (NS) 0.9 % 50 mL IVPB: 6.5 mg | INTRAVENOUS | @ 18:00:00

## 2022-11-18 MED ADMIN — gabapentin (NEURONTIN) capsule 100 mg: 100 mg | ORAL | @ 15:00:00

## 2022-11-18 MED ADMIN — diclofenac sodium (VOLTAREN) 1 % gel 2 g: 2 g | TOPICAL | @ 16:00:00

## 2022-11-18 MED ADMIN — lidocaine (ASPERCREME) 4 % 2 patch: 2 | TRANSDERMAL | @ 15:00:00

## 2022-11-18 MED ADMIN — gabapentin (NEURONTIN) capsule 100 mg: 100 mg | ORAL | @ 02:00:00

## 2022-11-18 MED ADMIN — HYDROmorphone (PF) (DILAUDID) injection 0.5 mg: .5 mg | INTRAVENOUS | @ 02:00:00 | Stop: 2022-11-24

## 2022-11-18 MED ADMIN — buprenorphine-naloxone (SUBOXONE) 2-0.5 mg SL film 1 mg of buprenorphine: .5 | SUBLINGUAL | @ 15:00:00

## 2022-11-18 MED ADMIN — naloxegol (MOVANTIK) 12.5 mg tablet 12.5 mg: 12.5 mg | ORAL | @ 15:00:00

## 2022-11-18 MED ADMIN — pantoprazole (Protonix) EC tablet 40 mg: 40 mg | ORAL | @ 15:00:00

## 2022-11-18 MED ADMIN — diazePAM (VALIUM) tablet 5 mg: 5 mg | ORAL | @ 02:00:00

## 2022-11-18 MED ADMIN — carboxymethylcellulose sodium (REFRESH CELLUVISC) 1 % ophthalmic gel 1 drop: 1 [drp] | OPHTHALMIC | @ 23:00:00

## 2022-11-18 MED ADMIN — HYDROmorphone (PF) (DILAUDID) injection 0.5 mg: .5 mg | INTRAVENOUS | @ 09:00:00 | Stop: 2022-11-24

## 2022-11-18 MED ADMIN — carboxymethylcellulose sodium (REFRESH CELLUVISC) 1 % ophthalmic gel 1 drop: 1 [drp] | OPHTHALMIC | @ 16:00:00

## 2022-11-18 MED ADMIN — HYDROmorphone (PF) (DILAUDID) injection 0.5 mg: .5 mg | INTRAVENOUS | @ 16:00:00 | Stop: 2022-11-24

## 2022-11-18 MED ADMIN — buprenorphine-naloxone (SUBOXONE) 2-0.5 mg SL film 1 mg of buprenorphine: .5 | SUBLINGUAL | @ 02:00:00

## 2022-11-18 MED ADMIN — multivitamin with folic acid 400 mcg tablet 1 tablet: 1 | ORAL | @ 15:00:00

## 2022-11-18 MED ADMIN — ondansetron (ZOFRAN) injection 4 mg: 4 mg | INTRAVENOUS | @ 10:00:00

## 2022-11-18 MED ADMIN — oxyCODONE (ROXICODONE) immediate release tablet 15 mg: 15 mg | ORAL | @ 05:00:00 | Stop: 2022-11-28

## 2022-11-18 MED ADMIN — diazePAM (VALIUM) tablet 2.5 mg: 2.5 mg | ORAL | @ 20:00:00

## 2022-11-18 MED ADMIN — nirogacestat (OGSIVEO) tablet 50 mg *Patient Supplied*: 50 mg | ORAL | Stop: 2022-11-18

## 2022-11-18 MED ADMIN — diazePAM (VALIUM) tablet 2.5 mg: 2.5 mg | ORAL | @ 15:00:00

## 2022-11-18 MED ADMIN — diclofenac sodium (VOLTAREN) 1 % gel 2 g: 2 g | TOPICAL | @ 23:00:00

## 2022-11-18 MED ADMIN — diclofenac sodium (VOLTAREN) 1 % gel 2 g: 2 g | TOPICAL | @ 02:00:00

## 2022-11-18 MED ADMIN — HYDROmorphone (PF) (DILAUDID) injection 0.5 mg: .5 mg | INTRAVENOUS | @ 23:00:00 | Stop: 2022-11-24

## 2022-11-18 MED ADMIN — oxyCODONE (ROXICODONE) immediate release tablet 15 mg: 15 mg | ORAL | @ 18:00:00 | Stop: 2022-11-28

## 2022-11-18 MED ADMIN — famotidine (PEPCID) tablet 20 mg: 20 mg | ORAL | @ 02:00:00

## 2022-11-18 MED ADMIN — buprenorphine-naloxone (SUBOXONE) 2-0.5 mg SL film 1 mg of buprenorphine: .5 | SUBLINGUAL | @ 20:00:00

## 2022-11-18 MED ADMIN — carboxymethylcellulose sodium (REFRESH CELLUVISC) 1 % ophthalmic gel 1 drop: 1 [drp] | OPHTHALMIC | @ 02:00:00

## 2022-11-18 MED ADMIN — nirogacestat (OGSIVEO) tablet 50 mg *Patient Supplied*: 50 mg | ORAL | @ 15:00:00 | Stop: 2022-11-18

## 2022-11-18 MED ADMIN — gabapentin (NEURONTIN) capsule 100 mg: 100 mg | ORAL | @ 20:00:00

## 2022-11-18 MED ADMIN — pantoprazole (Protonix) EC tablet 40 mg: 40 mg | ORAL | @ 02:00:00

## 2022-11-18 MED ADMIN — ondansetron (ZOFRAN) injection 4 mg: 4 mg | INTRAVENOUS | @ 23:00:00

## 2022-11-18 MED ADMIN — promethazine (PHENERGAN) 6.5 mg in sodium chloride (NS) 0.9 % 50 mL IVPB: 6.5 mg | INTRAVENOUS | @ 05:00:00

## 2022-11-18 NOTE — Unmapped (Signed)
Pt admitted for Acute on chronic pain/acute back pain secondary to MSK injury/abdominal desmoid fibromatosis. Pain managed with oral and IV pain meds. Pt continues to request antiemetics for nausea. Tolerating tube feeds overnight at goal rate. Pt able to eat small bites of food. Pt able to swallow meds whole. Pt had BM this shift in bedside commode. Pt compliant with SCD's and Prafo boots while in bed. Fall precaution remain in place with no fall this shift. VSS on 2L [place].    Problem: Adult Inpatient Plan of Care  Goal: Plan of Care Review  Outcome: Progressing  Goal: Patient-Specific Goal (Individualized)  Outcome: Progressing  Goal: Absence of Hospital-Acquired Illness or Injury  Outcome: Progressing  Intervention: Identify and Manage Fall Risk  Recent Flowsheet Documentation  Taken 11/17/2022 10/28/1999 by Deloria Lair, RN  Safety Interventions:   fall reduction program maintained   family at bedside   low bed  Intervention: Prevent Skin Injury  Recent Flowsheet Documentation  Taken 11/17/2022 2015 by Deloria Lair, RN  Positioning for Skin: Sitting in Chair  Device Skin Pressure Protection: adhesive use limited  Skin Protection: adhesive use limited  Intervention: Prevent and Manage VTE (Venous Thromboembolism) Risk  Recent Flowsheet Documentation  Taken 11/17/2022 2015 by Deloria Lair, RN  Anti-Embolism Device Type: SCD, Knee  Anti-Embolism Intervention: On  Anti-Embolism Device Location: BLE  Intervention: Prevent Infection  Recent Flowsheet Documentation  Taken 11/17/2022 December 12, 1999 by Deloria Lair, RN  Infection Prevention:   personal protective equipment utilized   rest/sleep promoted  Goal: Optimal Comfort and Wellbeing  Outcome: Progressing  Goal: Readiness for Transition of Care  Outcome: Progressing  Goal: Rounds/Family Conference  Outcome: Progressing     Problem: Latex Allergy  Goal: Absence of Allergy Symptoms  Outcome: Progressing     Problem: Fall Injury Risk  Goal: Absence of Fall and Fall-Related Injury  Outcome: Progressing  Intervention: Promote Injury-Free Environment  Recent Flowsheet Documentation  Taken 11/17/2022 Jul 16, 1999 by Deloria Lair, RN  Safety Interventions:   fall reduction program maintained   family at bedside   low bed     Problem: Skin Injury Risk Increased  Goal: Skin Health and Integrity  Outcome: Progressing  Intervention: Optimize Skin Protection  Recent Flowsheet Documentation  Taken 11/17/2022 2015 by Deloria Lair, RN  Pressure Reduction Techniques:   frequent weight shift encouraged   weight shift assistance provided  Pressure Reduction Devices:   pressure-redistributing mattress utilized   positioning supports utilized  Skin Protection: adhesive use limited     Problem: Self-Care Deficit  Goal: Improved Ability to Complete Activities of Daily Living  Outcome: Progressing     Problem: Comorbidity Management  Goal: Blood Glucose Levels Within Targeted Range  Outcome: Progressing  Goal: Blood Pressure in Desired Range  Outcome: Progressing     Problem: Nausea and Vomiting  Goal: Nausea and Vomiting Relief  Outcome: Progressing     Problem: Infection  Goal: Absence of Infection Signs and Symptoms  Outcome: Progressing  Intervention: Prevent or Manage Infection  Recent Flowsheet Documentation  Taken 11/17/2022 14-Nov-1999 by Deloria Lair, RN  Infection Management: aseptic technique maintained     Problem: Pain Acute  Goal: Optimal Pain Control and Function  Outcome: Progressing     Problem: Malnutrition  Goal: Improved Nutritional Intake  Outcome: Progressing

## 2022-11-18 NOTE — Unmapped (Signed)
Hospital Medicine Progress Note    Assessment/Plan:    Principal Problem:    Other acute postprocedural pain  Active Problems:    [name]    Intestinal polyps    [name]    Neoplasm related pain    Physical deconditioning    History of colectomy    Intractable abdominal pain    Generalized anxiety disorder with panic attacks    SBO (small bowel obstruction) (CMS-HCC)    Small intestinal bacterial overgrowth (SIBO)      [name] [name] is a 22 y.o. y/o female that presents to [facility] with Other acute postprocedural pain.    Acute on chronic pain/acute back pain secondary to MSK injury/abdominal desmoid fibromatosis:  Pain continues to be limiting, in legs, abdomen, but is overall stable. No obvious reversible triggers, suspect relates primarily to her recent muscular issues and Gardner's syndrome.  Continues to take hydromorphone 4x daily, with some use of PO oxycodone. Appreciate Chronic Pain evaluation continued assistance. Is close to ready for transfer to [facility].   -Anesthesia pain consult following  -Continue Suboxone 1/0.25 mg tid  -Continue hydromorphone IV 0.5 mg every 6 hours if needed  -Continue to encourage use of oxycodone 10 to 15 mg every 4 hours as needed over IV  -Continue lidocaine patches  -Continue gabapentin    Muscle spasm and stiffness/difficulty with ambulation:  Initial presenting complaint.  Has improved from admission with start of diazepam but continues to have some symptoms. Have tapered and changed baclofen to prn without change in symptoms. She continues to note difficulty with gait, especially with knees and feeling of buckling. Diagnosis remains unclear. Workup included MRI of spine and brain, EMG and autoimmune myopathy panel all negative. Paraneoplastic antibody panel notable for a slightly elevated PQ type calcium channel antibody of unclear significance.  Neurology and PM&R do not feel this is consistent with stiff man syndrome especially in light of EMG findings. Plan to continue treatment with diazepam with baclofen if needed and aggressive PT/OT.  -PM&R to reassess gait for need for additional support with therapy.   -Continue baclofen 10 mg 3 times daily as needed for spasms  -Continue diazepam at current doses  -Continue gabapentin  -Knee patellar braces to assist with ambulation - will request again from CD  -PT/OT recommending 5 times high intensity, working towards [facility]    Nausea/malnutrition  ND tube functioning well. Having regular BMs and ileus appears to be resolved. Previously has required TPN. Intake has gradually improved  -Nutrition following, decreased total calories via 60ml/hr x13 hours on 9/7. Will continue to limit tubefeeds to encourage PO intake, and to document PO intake to help guide nutrition plan. Expect RD can continue to follow and make recommendations at [facility]. Could consult GI there if needed.   -Continue bowel regimen  -Continue naloxegol    [name]/desmoid fibromatosis:  Follows with Dr. [name] of [facility] oncology.  Had recent triamcinolone and bupivacaine injection of the RUE and left chest tumor by IR on 7/18.  Takes nirogacestat 150 mg twice daily but this has been held intermittently due to other medical reasons.  Plan was to start 50 mg twice daily and titrate up by 50 mg each week until back at usual dose.  LFTs were mildly elevated but since normalized, without a clear etiology.  -Increased nirogacestat to 100 mg twice daily (started 9/3, increased 9/10, then increase to 150mg  BID on 9/17)    Generalized anxiety disorder/depression:  Previously on lorazepam  which has been changed to diazepam after discussion with psychiatry for stiffness. Psychiatry has been following   -Continue diazepam at current dosing  -Continue mirtazapine    Enterobacteriaceae GNR/Candida infection related to tunneled line, resolved  Line now removed.  Repeat cultures negative.  Underwent TEE x 2 without evidence of vegetations.  Optho evaluation on 8/15 without evidence of infection.  Completed treatment with cephalosporin and fluconazole on 8/31.  -No further treatment at this time    Iron deficiency anemia:  Received 1 g ferumoxytol during this hospitalization.  Of note, has a history of Fe hypersensitivity reactions and evaluated by [facility].  Hb and MCV  Received cetirizine for allergic symptoms but now stopped.   -No further treatment    FEN: Regular Diet as tolerated. Cycling Tfs over 13 hours to encourage intake during the day.   PPx:  Patient declines chemical prophylaxis due to bleeding risk.  Disposition: Inpatient, working towards AIR discharge - hopefully ~9/11 or 9/12. Ok if still has corpak and some IV pain med needs as above.  Code: FULL     [place] Service, MD  Please page [facility] [phone] with questions.      I personally spent 40 minutes face-to-face and non-face-to-face in the care of this patient, which includes all pre, intra, and post visit time on the date of service.  All documented time was specific to the E/M visit and does not include any procedures that may have been performed.    ___________________________________________________________________    Subjective:    Tolerating TF at current rate. RD assessed yesterday and PT/OT plan to reassess today. She's still hoping to get the appropriate knee brace to help with instability. Pain and other chronic symptoms are largely unchanged. Hopeful to go to AIR this week, ~9/12    Objective:    Vital signs in last 24 hours:  Temp:  [36.4 ??C (97.5 ??F)-36.7 ??C (98.1 ??F)] 36.7 ??C (98.1 ??F)  Heart Rate:  [78-101] 86  Resp:  [16-18] 16  BP: (100-129)/(58-71) 100/58  MAP (mmHg):  [71-87] 71  SpO2:  [99 %-100 %] 100 %    Intake/Output last 3 shifts:  I/O last 3 completed shifts:  In: -   Out: 3500 [Urine:3500]    Physical Exam:  GEN: In bed, occasionally anxious  EYES: Anicteric  ENT: OP moist, NG in place  CV:  RRR, normal S1, S2, no murmur  PULM:  Moderate effort, no crackles or wheezes  ABD: Continued tenderness to palpation without rebound or guard, good bowel sound  Extremities: stable stiffness in BLE.  Bilateral knees without effusions.     Medications:  Scheduled Meds:   buprenorphine-naloxone  0.5 Film Sublingual TID    carboxymethylcellulose sodium  1 drop Both Eyes QID    diazePAM  2.5 mg Oral Daily    And    diazePAM  2.5 mg Oral Daily    diazePAM  5 mg Oral Nightly    diclofenac sodium  2 g Topical QID    famotidine  20 mg Oral Nightly    gabapentin  100 mg Oral TID    HYDROmorphone  0.5 mg Intravenous Once    lidocaine  2 patch Transdermal Daily    mirtazapine  15 mg Enteral tube: gastric Nightly    multivitamins (ADULT)  1 tablet Oral Daily    naloxegol  12.5 mg Oral Daily    nirogacestat  100 mg Oral BID    nirogacestat  50 mg Oral BID  pantoprazole  40 mg Oral BID     Continuous Infusions:   Chemo Clarification Order      sodium chloride 10 mL/hr (11/09/22 0314)     PRN Meds:.acetaminophen, alum-mag-simeth, baclofen, bisacodyl, butalbital-acetaminophen-caffeine, carboxymethylcellulose sodium, Chemo Clarification Order, diazePAM, guaiFENesin, hydrocortisone, HYDROmorphone, lidocaine-diphenhydrAMINE-aluminum-magnesium, ondansetron, oxyCODONE **OR** oxyCODONE, phenol, polyethylene glycol, promethazine, pseudoephedrine, senna, zinc oxide-cod liver oil    Labs/Studies:  Labs/Studies reviewed by me.  Notable for values below.    Recent Labs     Units 11/13/22  1039 11/15/22  0936 11/17/22  1826   WBC 10*9/L 5.5 4.8 6.4   RBC 10*12/L 3.55* 3.39* 3.74*   HGB g/dL 28.3* 9.5* 15.1*   HCT % 29.5* 28.0* 30.8*   MCV fL 83.0 82.5 82.4   MCH pg 28.2 27.9 28.4   MCHC g/dL 76.1 60.7 37.1   RDW % 17.3* 16.8* 17.0*   PLT 10*9/L 157 151 183   MPV fL 10.3 9.8 9.5       Recent Labs     Units 11/15/22  0538 11/16/22  0513 11/17/22  1826   NA mmol/L 139 141 143   K mmol/L 3.6 3.7 3.7   CL mmol/L 103 106 107   CO2 mmol/L 32.0* 30.0 28.0   BUN mg/dL 7* 6* 6*   CREATININE mg/dL 0.62* 6.94* 8.54*   GLU mg/dL 627 99 035 Recent Labs     Units 11/17/22  1826   ALBUMIN g/dL 2.9*   ALT U/L 26   AST U/L 23   ALKPHOS U/L 101   BILITOT mg/dL 0.2*   PROT g/dL 5.5*

## 2022-11-18 NOTE — Unmapped (Signed)
[facility]  Follow-Up Psychiatry Consult Note      Date of admission: 09/25/2022 10:30 AM  Service Date: November 18, 2022  Primary Team: [facility])  LOS:  LOS: 52 days      Assessment:   [name] [name] is a 22 y.o. 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 and reported past psych history of GAD and MDD, admitted 09/25/2022 10:30 AM for other acute postprocedural pain following injection of desmoid tumor with steroid and anesthetic under ultrasound guidance.  Patient was seen in consultation by request of hospitalist on [facility] for evaluation of anxiety and depression.      [name] presents with symptoms consistent with a diagnosis of GAD and MDD, consistent with her historical diagnoses. Symptoms have worsened in the context of prolonged medical hospitalization, with increased anxiety/panic attacks.     Today, patient again with cautious optimism regarding strength and potential disposition to AIR. She says she is now open to re-starting mirtazapine, which she had been refusing due to concerns about oversedation in combination with diazepam. No other psychotropic medication changes recommended.     Diagnoses:   Active Hospital problems:  Principal Problem:    Other acute postprocedural pain  Active Problems:    [name]    Intestinal polyps    [name]    Neoplasm related pain    Physical deconditioning    History of colectomy    Intractable abdominal pain    Generalized anxiety disorder with panic attacks    SBO (small bowel obstruction) (CMS-HCC)    Small intestinal bacterial overgrowth (SIBO)       Problems edited/added by me:  No problems updated.    Risk Assessment:  ASQ screening result: not completed    -A full risk assessment was previously performed on 7/31.  Risk assessment remains essentially unchanged.    Current suicide risk: low risk  Current homicide risk: low risk      Recommendations:     Safety and Observation Level:   -- This patient is not currently under IVC. If safety concerns arise, please page psychiatry for an evaluation.    Medications:  -- continue diazepam 2.5mg  qAM + 2.5mg  qafternoon + 5mg  qHS for anxiety, muscle spasms  - re-start mirtazapine 15mg  qHS for mood and anxiety (pt has not taken since 9/4, but says she is now amenable)    Further Work-up:   -- No further recommendations at this time from a psychiatric standpoint    Behavioral / Environmental:   -- Utilize compassion and acknowledge the patient's experiences while setting clear and realistic expectations for care.     Follow-up:  -- When patient is discharged, please ensure that their AVS includes information about the 34 Suicide & Crisis Lifeline.  -- The patient currently receives mental health care with [facility] and we will attempt to coordinate followup with our psychiatry team on discharge. She is also recently established with a virtual DBT group at [facility] ([employer] LCS[facility]) although it is unclear what the status of this group is   -- We will follow as needed at this time.     Thank you for this consult request. Recommendations have been communicated to the primary team. Please page [phone]  for any questions or concerns.     Discussed with and seen by the attending, Dr. Derrill Kay, MD, who agrees with the assessment and plan.  Ross Marcus, MD, PGY-5  [facility] Psychiatry Fellow        Subjective     Relevant events since last seen by psychiatry: Has refused mirtazapine 15 mg nightly for last several days (last received 9/4)    Patient Interview:    [name] patient both with her sister at the bedside, and on her own. Pt says that earlier in the hospitalization she had some thoughts of letting herself die by stopping medical intervention, but that now she feels more helpful. She discusses her challenges with establishing independence in the setting of her medical illness, and of navigating shame around her medical illness.     She says she has been refusing mirtazapine because she was worried about being oversedated by the combination of mirtazapine and diazepam. She says she will consider starting the mirtazapine again now.     Sleep was again poor last night, per pt.      ROS:   All systems reviewed as negative/unremarkable aside from the following pertinent positives and negatives: none noted    Collateral:   - Reviewed medical records in Epic    Relevant Updates to past psychiatric, medical/surgical, family, or social history: n/a    Current Medications:  Scheduled Meds:   buprenorphine-naloxone  0.5 Film Sublingual TID    carboxymethylcellulose sodium  1 drop Both Eyes QID    diazePAM  2.5 mg Oral Daily    And    diazePAM  2.5 mg Oral Daily    diazePAM  5 mg Oral Nightly    diclofenac sodium  2 g Topical QID    famotidine  20 mg Oral Nightly    gabapentin  100 mg Oral TID    HYDROmorphone  0.5 mg Intravenous Once    lidocaine  2 patch Transdermal Daily    mirtazapine  15 mg Enteral tube: gastric Nightly    multivitamins (ADULT)  1 tablet Oral Daily    naloxegol  12.5 mg Oral Daily    pantoprazole  40 mg Oral BID     Continuous Infusions:   Chemo Clarification Order      IP okay to treat      sodium chloride 10 mL/hr (11/09/22 0314)     PRN Meds:.acetaminophen, alum-mag-simeth, baclofen, bisacodyl, butalbital-acetaminophen-caffeine, carboxymethylcellulose sodium, Chemo Clarification Order, diazePAM, guaiFENesin, hydrocortisone, HYDROmorphone, IP okay to treat, lidocaine-diphenhydrAMINE-aluminum-magnesium, ondansetron, oxyCODONE **OR** oxyCODONE, phenol, polyethylene glycol, promethazine, pseudoephedrine, senna, zinc oxide-cod liver oil      Objective:   Vital signs:   Temp:  [36.4 ??C (97.5 ??F)-36.7 ??C (98.1 ??F)] 36.7 ??C (98.1 ??F)  Heart Rate:  [86-101] 86  Resp:  [16-18] 16  BP: (100-129)/(58-71) 100/58  MAP (mmHg):  [71-87] 71  SpO2:  [99 %-100 %] 100 %    Physical Exam:  Gen: No acute distress.    Mental Status Exam:  Appearance:  appears stated age and in bed -- NG tube in place, IV lines present. Dressed in [employer].    Attitude:   calm, cooperative   Behavior/Psychomotor:  appropriate eye contact and no abnormal movements   Speech/Language:   normal rate, not pressured, normal volume, normal fluency. normal articulation   Mood:  Describes as slightly hopeful   Affect:  mood congruent and euthymic   Thought process:  logical, linear, clear, coherent, goal directed   Thought content:   Reports no SI, revolves around navigating interpersonal and developmental challenges under medical stress. Appropriate.    Perceptual disturbances:  Patient does not appear to be responding to internal stimuli     Attention:  able to attend to interview without fluctuations in consciousness   Concentration:  Able to fully concentrate and attend   Orientation:  grossly oriented.   Memory:  not formally tested, but grossly intact   Fund of knowledge:   not formally assessed   Insight:    Intact   Judgment:   Intact   Impulse Control:  Intact     Relevant laboratory/imaging data was reviewed. CBC on 9/9 notable for continued anemia (10.6 from 9.5 on 9/7). Chemistry notable for slightly low albumin at 2.9.     Additional Psychometric Testing:  Not applicable.    Consult Type and Time-Based Documentation:  This patient was evaluated in person.    Time-based billing disclaimer:  I personally spent 50   minutes face-to-face and non-face-to-face in the care of this patient, which includes all pre, intra, and post visit time on the date of service.  All documented time was specific to the E/M visit and does not include any procedures that may have been performed.    Ross Marcus, MD, PGY-5  [facility] Psychiatry Fellow

## 2022-11-18 NOTE — Unmapped (Signed)
Hospital Medicine Progress Note    Assessment/Plan:    Principal Problem:    Other acute postprocedural pain  Active Problems:    [name]    Intestinal polyps    [name]    Neoplasm related pain    Physical deconditioning    History of colectomy    Intractable abdominal pain    Generalized anxiety disorder with panic attacks    SBO (small bowel obstruction) (CMS-HCC)    Small intestinal bacterial overgrowth (SIBO)      [name] [name] is a 22 y.o. y/o female that presents to [facility] with Other acute postprocedural pain.    Acute on chronic pain/acute back pain secondary to MSK injury/abdominal desmoid fibromatosis:  Pain continues to be limiting, and in multiple places including low back since strenous exam last week, legs, abdomen. No obvious reversible triggers, suspect relates primarily to her recent muscular issues and Gardner's syndrome.  Continue to take hydromorphone 5-6x daily most days, with some use of PO oxycodone.  As discussed with patient and Pain team, this will be limiting for transition and would prefer to maximize oral regimen to decrease IV need as we are further away from the triggering events.  Pain has been stable on current dose of suboxone and hydromorphone 0.5 mg every every 6 hours as needed.  Appreciate Chronic Pain evaluation continued assistance. Is close to ready for transfer to AIR.   -Anesthesia pain consult following  -Continue Suboxone 1/0.25 mg tid  -Continue hydromorphone IV 0.5 mg every 6 hours if needed  -Continue to encourage use of oxycodone 10 to 15 mg every 4 hours as needed over IV  -Continue lidocaine patches  -Continue gabapentin    Muscle spasm and stiffness/difficulty with ambulation:  Initial presenting complaint.  Has improved from admission with start of diazepam but continues to have some symptoms. Have tapered and changed baclofen to prn without change in symptoms.  She continues to note difficulty with gait, especially with knees and feeling of buckling. Diagnosis remains unclear.  Workup included MRI of spine and brain, EMG and autoimmune myopathy panel all negative. Paraneoplastic antibody panel notable for a slightly elevated PQ type calcium channel antibody of unclear significance.  Neurology and PM&R do not feel this is consistent with stiff man syndrome especially in light of EMG findings. Plan to continue treatment with diazepam with baclofen if needed and aggressive PT/OT.  -Discussed with PM&R who plan to reassess gait for need for additional support with therapy.   -Continue baclofen 10 mg 3 times daily as needed for spasms, can restart if patient notes worsening symptoms  -Continue diazepam at current doses  -Continue gabapentin  -Knee patellar braces to assist with ambulation  -PT/OT recommending 5 times high intensity, working towards AIR    Nausea/malnutrition/lieus:  Replaced ND tube functioning well.  Having regular BMs and ileus appears to be well managed. Previously has required TPN. Intake has gradually improved  -Nutrition following, decreased total calories via 77ml/hr x13 hours on 9/7.   -Continue bowel regimen  -Continue naloxegol    [name]/desmoid fibromatosis:  Follows with Dr. [name] of [facility] oncology.  Had recent triamcinolone and bupivacaine injection of the RUE and left chest tumor by IR on 7/18.  Takes nirogacestat 150 mg twice daily but this has been held intermittently due to other medical reasons.  Plan was to start 50 mg twice daily and titrate up by 50 mg each week until back at usual dose.  LFTs have been mildly  elevated but overall improved and without a clear etiology.  -Continue nirogacestat at 50 mg twice daily (started 9/3, will increase dose 9/10)  -Titrate up by week to usual 150 mg bid     LFT elevation, resolved  Has had intermittent elevations of ALT, AST and alkaline phosphatase over the hospitalization. Now resolved, without a clear etiology.  Did previously receive TPN, cefazolin and fluconazole which all could have contributed.  Had a liver ultrasound on 8/15 negative.  Now back on her usual [name] treatment.  -Continue to monitor periodically    Generalized anxiety disorder/depression:  Previously on lorazepam which has been changed to diazepam after discussion with psychiatry for stiffness. Psychiatry has been following   -Continue diazepam at current dosing  -Continue mirtazapine    Enterobacteriaceae GNR/Candida infection related to tunneled line, resolved  Line now removed.  Repeat cultures negative.  Underwent TEE x 2 without evidence of vegetations.  Optho evaluation on 8/15 without evidence of infection.  Completed treatment with cephalosporin and fluconazole on 8/31.  -No further treatment at this time    Iron deficiency anemia:  Received 1 g ferumoxytol during this hospitalization.  Of note, has a history of Fe hypersensitivity reactions and evaluated by [facility].  Hb and MCV  Received cetirizine for allergic symptoms but now stopped.   -No further treatment    FEN: Regular Diet as tolerated. Cycling Tfs over 13 hours to encourage intake during the day.   PPx:  Patient declines chemical prophylaxis due to bleeding risk.  Disposition: Inpatient, working towards AIR discharge - hopefully ~9/11 or 9/12. Ok if still has corpak and some IV pain med needs.   Code: FULL     [place] Service, MD  Please page [facility] [phone] with questions.      I personally spent 50 minutes face-to-face and non-face-to-face in the care of this patient, which includes all pre, intra, and post visit time on the date of service.  All documented time was specific to the E/M visit and does not include any procedures that may have been performed.    ___________________________________________________________________    Subjective:    Tolerating TF at current rate. Feels her po intake continues to steadily improve. Says she's feeling the effects of her chemo medication, suspects that's the cause of recent fatigue and headache. Some intermittent nausea. Pain essentially unchanged. Discussed with PMR/AIR team to update re: status, she's hopeful she could go to AIR within the next few days.     Objective:    Vital signs in last 24 hours:  Temp:  [36.4 ??C (97.5 ??F)-36.7 ??C (98.1 ??F)] 36.5 ??C (97.7 ??F)  Heart Rate:  [76-107] 78  Resp:  [16-18] 18  BP: (97-129)/(60-90) 129/71  MAP (mmHg):  [71-97] 87  SpO2:  [99 %-100 %] 99 %    Intake/Output last 3 shifts:  I/O last 3 completed shifts:  In: 240 [P.O.:240]  Out: 2850 [Urine:2850]    Physical Exam:  GEN: In bed, occasionally anxious  EYES: Anicteric  ENT: OP moist, NG in place  CV:  RRR, normal S1, S2, no murmur  PULM:  Moderate effort, no crackles or wheezes  ABD: Continued tenderness to palpation without rebound or guard, good bowel sound  Extremities: stable stiffness in BLE.  Bilateral knees without effusions.     Medications:  Scheduled Meds:   buprenorphine-naloxone  0.5 Film Sublingual TID    carboxymethylcellulose sodium  1 drop Both Eyes QID    diazePAM  2.5 mg Oral  Daily    And    diazePAM  2.5 mg Oral Daily    diazePAM  5 mg Oral Nightly    diclofenac sodium  2 g Topical QID    famotidine  20 mg Oral Nightly    gabapentin  100 mg Oral TID    HYDROmorphone  0.5 mg Intravenous Once    lidocaine  2 patch Transdermal Daily    mirtazapine  15 mg Enteral tube: gastric Nightly    multivitamins (ADULT)  1 tablet Oral Daily    naloxegol  12.5 mg Oral Daily    nirogacestat  50 mg Oral BID    pantoprazole  40 mg Oral BID     Continuous Infusions:   Chemo Clarification Order      sodium chloride 10 mL/hr (11/09/22 0314)     PRN Meds:.acetaminophen, alum-mag-simeth, baclofen, bisacodyl, butalbital-acetaminophen-caffeine, carboxymethylcellulose sodium, Chemo Clarification Order, diazePAM, guaiFENesin, hydrocortisone, HYDROmorphone, lidocaine-diphenhydrAMINE-aluminum-magnesium, ondansetron, oxyCODONE **OR** oxyCODONE, phenol, polyethylene glycol, promethazine, pseudoephedrine, senna, zinc oxide-cod liver oil    Labs/Studies:  Labs/Studies reviewed by me.  Notable for values below.    Recent Labs     Units 11/11/22  0620 11/13/22  1039 11/15/22  0936   WBC 10*9/L 5.2 5.5 4.8   RBC 10*12/L 3.49* 3.55* 3.39*   HGB g/dL 9.8* 04.5* 9.5*   HCT % 29.0* 29.5* 28.0*   MCV fL 82.9 83.0 82.5   MCH pg 28.0 28.2 27.9   MCHC g/dL 40.9 81.1 91.4   RDW % 17.1* 17.3* 16.8*   PLT 10*9/L 155 157 151   MPV fL 10.3 10.3 9.8       Recent Labs     Units 11/14/22  0536 11/15/22  0538 11/16/22  0513   NA mmol/L 140 139 141   K mmol/L 3.7 3.6 3.7   CL mmol/L 103 103 106   CO2 mmol/L 32.0* 32.0* 30.0   BUN mg/dL 7* 7* 6*   CREATININE mg/dL 7.82* 9.56* 2.13*   GLU mg/dL 086 578 99       Recent Labs     Units 11/16/22  0513   ALBUMIN g/dL 3.0*   ALT U/L 28   AST U/L 23   ALKPHOS U/L 113   BILITOT mg/dL <4.6*   PROT g/dL 5.7

## 2022-11-19 ENCOUNTER — Ambulatory Visit
Admission: TF | Admit: 2022-11-19 | Discharge: 2022-12-10 | Disposition: A | Discharge status: Home / Self Care | Payer: PRIVATE HEALTH INSURANCE | Source: Intra-hospital

## 2022-11-19 DIAGNOSIS — M6281 Muscle weakness (generalized): Secondary | ICD-10-CM | POA: Diagnosis not present

## 2022-11-19 DIAGNOSIS — R601 Generalized edema: Secondary | ICD-10-CM | POA: Diagnosis not present

## 2022-11-19 DIAGNOSIS — Z8616 Personal history of COVID-19: Secondary | ICD-10-CM | POA: Diagnosis not present

## 2022-11-19 DIAGNOSIS — K567 Ileus, unspecified: Secondary | ICD-10-CM | POA: Diagnosis not present

## 2022-11-19 DIAGNOSIS — M62838 Other muscle spasm: Secondary | ICD-10-CM | POA: Diagnosis not present

## 2022-11-19 DIAGNOSIS — D48118 Desmoid tumor of other site: Secondary | ICD-10-CM | POA: Diagnosis not present

## 2022-11-19 DIAGNOSIS — R5381 Other malaise: Secondary | ICD-10-CM | POA: Diagnosis not present

## 2022-11-19 DIAGNOSIS — Z931 Gastrostomy status: Secondary | ICD-10-CM | POA: Diagnosis not present

## 2022-11-19 DIAGNOSIS — B9689 Other specified bacterial agents as the cause of diseases classified elsewhere: Secondary | ICD-10-CM | POA: Diagnosis not present

## 2022-11-19 DIAGNOSIS — K638219 Small intestinal bacterial overgrowth, unspecified: Secondary | ICD-10-CM | POA: Diagnosis not present

## 2022-11-19 DIAGNOSIS — F411 Generalized anxiety disorder: Secondary | ICD-10-CM | POA: Diagnosis not present

## 2022-11-19 DIAGNOSIS — D48119 Desmoid tumor of unspecified site: Secondary | ICD-10-CM | POA: Diagnosis not present

## 2022-11-19 DIAGNOSIS — D126 Benign neoplasm of colon, unspecified: Secondary | ICD-10-CM | POA: Diagnosis not present

## 2022-11-19 DIAGNOSIS — R279 Unspecified lack of coordination: Secondary | ICD-10-CM | POA: Diagnosis not present

## 2022-11-19 DIAGNOSIS — K921 Melena: Secondary | ICD-10-CM | POA: Diagnosis not present

## 2022-11-19 DIAGNOSIS — D48116 Desmoid tumor of lower extremity and pelvic girdle: Secondary | ICD-10-CM | POA: Diagnosis not present

## 2022-11-19 DIAGNOSIS — G248 Other dystonia: Secondary | ICD-10-CM | POA: Diagnosis not present

## 2022-11-19 DIAGNOSIS — F32A Depression, unspecified: Secondary | ICD-10-CM | POA: Diagnosis not present

## 2022-11-19 DIAGNOSIS — R638 Other symptoms and signs concerning food and fluid intake: Secondary | ICD-10-CM | POA: Diagnosis not present

## 2022-11-19 DIAGNOSIS — D509 Iron deficiency anemia, unspecified: Secondary | ICD-10-CM | POA: Diagnosis not present

## 2022-11-19 DIAGNOSIS — D48115 Desmoid tumor of upper extremity and shoulder girdle: Secondary | ICD-10-CM | POA: Diagnosis not present

## 2022-11-19 DIAGNOSIS — J439 Emphysema, unspecified: Secondary | ICD-10-CM | POA: Diagnosis not present

## 2022-11-19 DIAGNOSIS — R55 Syncope and collapse: Secondary | ICD-10-CM | POA: Diagnosis not present

## 2022-11-19 DIAGNOSIS — Z452 Encounter for adjustment and management of vascular access device: Secondary | ICD-10-CM | POA: Diagnosis not present

## 2022-11-19 DIAGNOSIS — K9423 Gastrostomy malfunction: Secondary | ICD-10-CM | POA: Diagnosis not present

## 2022-11-19 DIAGNOSIS — R7881 Bacteremia: Secondary | ICD-10-CM | POA: Diagnosis not present

## 2022-11-19 DIAGNOSIS — Z4682 Encounter for fitting and adjustment of non-vascular catheter: Secondary | ICD-10-CM | POA: Diagnosis not present

## 2022-11-19 MED ORDER — PANTOPRAZOLE 40 MG TABLET,DELAYED RELEASE
ORAL_TABLET | Freq: Two times a day (BID) | ORAL | 0 refills | 30.00000 days | Status: SS
Start: 2022-11-19 — End: 2022-12-19

## 2022-11-19 MED ORDER — DICLOFENAC 1 % TOPICAL GEL
Freq: Four times a day (QID) | TOPICAL | 0 refills | 13.00000 days | Status: SS
Start: 2022-11-19 — End: 2023-11-19

## 2022-11-19 MED ORDER — BACLOFEN 10 MG TABLET
ORAL_TABLET | Freq: Three times a day (TID) | ORAL | 0 refills | 30.00000 days | Status: SS | PRN
Start: 2022-11-19 — End: 2022-12-19

## 2022-11-19 MED ORDER — ALUMINUM-MAG HYDROXIDE-SIMETHICONE 200 MG-200 MG-20 MG/5 ML ORAL SUSP
Freq: Four times a day (QID) | ORAL | 0 refills | 2.00000 days | Status: SS | PRN
Start: 2022-11-19 — End: 2022-12-19

## 2022-11-19 MED ORDER — BUPRENORPHINE 2 MG-NALOXONE 0.5 MG SUBLINGUAL FILM
ORAL_FILM | Freq: Three times a day (TID) | SUBLINGUAL | 0 refills | 6.00000 days | Status: SS
Start: 2022-11-19 — End: 2022-11-24

## 2022-11-19 MED ORDER — PSEUDOEPHEDRINE 30 MG TABLET
ORAL_TABLET | Freq: Every day | ORAL | 0 refills | 30.00000 days | Status: SS | PRN
Start: 2022-11-19 — End: ?

## 2022-11-19 MED ORDER — ONDANSETRON HCL (PF) 4 MG/2 ML INJECTION SOLUTION
Freq: Two times a day (BID) | INTRAVENOUS | 0 refills | 5.00000 days | Status: SS | PRN
Start: 2022-11-19 — End: 2022-12-19

## 2022-11-19 MED ORDER — ZINC OXIDE-COD LIVER OIL 40 % TOPICAL PASTE
Freq: Every day | TOPICAL | 0 refills | 0.00000 days | Status: SS | PRN
Start: 2022-11-19 — End: ?

## 2022-11-19 MED ORDER — OXYCODONE 10 MG TABLET
ORAL_TABLET | ORAL | 0 refills | 2.00000 days | Status: SS | PRN
Start: 2022-11-19 — End: 2022-11-24

## 2022-11-19 MED ORDER — SODIUM CHLORIDE 0.9 % INTRAVENOUS SOLUTION
INTRAVENOUS | 0 refills | 0.00000 days | Status: SS
Start: 2022-11-19 — End: ?

## 2022-11-19 MED ORDER — HYDROMORPHONE 1 MG/ML INJECTION WRAPPER
Freq: Four times a day (QID) | INTRAVENOUS | 0 refills | 3.00000 days | Status: SS | PRN
Start: 2022-11-19 — End: 2022-11-24

## 2022-11-19 MED ORDER — OXYCODONE 15 MG TABLET
ORAL_TABLET | ORAL | 0 refills | 2.00000 days | Status: SS | PRN
Start: 2022-11-19 — End: 2022-11-24

## 2022-11-19 MED ORDER — PHENOL 1.4 % MUCOSAL AEROSOL SPRAY
0 refills | 1.00000 days | Status: SS | PRN
Start: 2022-11-19 — End: ?

## 2022-11-19 MED ORDER — SENNOSIDES 8.6 MG TABLET
ORAL_TABLET | Freq: Two times a day (BID) | ORAL | 0 refills | 30.00000 days | Status: SS | PRN
Start: 2022-11-19 — End: 2022-12-19

## 2022-11-19 MED ORDER — FAMOTIDINE 20 MG TABLET
ORAL_TABLET | Freq: Every evening | ORAL | 0 refills | 30.00000 days | Status: SS
Start: 2022-11-19 — End: 2022-12-19

## 2022-11-19 MED ORDER — GABAPENTIN 100 MG CAPSULE
ORAL_CAPSULE | Freq: Three times a day (TID) | ORAL | 0 refills | 30.00000 days | Status: SS
Start: 2022-11-19 — End: 2022-12-19

## 2022-11-19 MED ORDER — DIPHENHYD 25 MG-LIDO 200 MG-MAG,AL 400 MG-SIMETH 40 MG/30 ML MOUTHWASH
Freq: Four times a day (QID) | OROMUCOSAL | 0 refills | 3.00000 days | Status: SS | PRN
Start: 2022-11-19 — End: ?

## 2022-11-19 MED ORDER — NIROGACESTAT 100 MG TABLET
ORAL_TABLET | Freq: Two times a day (BID) | ORAL | 0 refills | 30.00000 days | Status: SS
Start: 2022-11-19 — End: ?

## 2022-11-19 MED ORDER — POLYETHYLENE GLYCOL 3350 17 GRAM ORAL POWDER PACKET
PACK | Freq: Two times a day (BID) | ORAL | 0 refills | 15.00000 days | Status: SS | PRN
Start: 2022-11-19 — End: 2022-12-19

## 2022-11-19 MED ORDER — MIRTAZAPINE 15 MG TABLET
ORAL_TABLET | Freq: Every evening | ORAL | 3 refills | 90.00000 days | Status: SS
Start: 2022-11-19 — End: 2022-12-19

## 2022-11-19 MED ORDER — GUAIFENESIN 100 MG/5 ML ORAL LIQUID
ORAL | 0 refills | 2.00000 days | Status: SS | PRN
Start: 2022-11-19 — End: ?

## 2022-11-19 MED ORDER — DIAZEPAM 5 MG TABLET
ORAL_TABLET | Freq: Every evening | ORAL | 0 refills | 60.00000 days | Status: SS
Start: 2022-11-19 — End: 2023-01-18

## 2022-11-19 MED ORDER — HYDROCORTISONE 2.5 % TOPICAL CREAM
Freq: Two times a day (BID) | TOPICAL | 0 refills | 0.00000 days | Status: SS | PRN
Start: 2022-11-19 — End: 2023-11-19

## 2022-11-19 MED ORDER — BISACODYL 10 MG RECTAL SUPPOSITORY
Freq: Two times a day (BID) | RECTAL | 0 refills | 6.00000 days | Status: SS | PRN
Start: 2022-11-19 — End: 2022-12-19

## 2022-11-19 MED ORDER — ACETAMINOPHEN 325 MG TABLET
ORAL_TABLET | Freq: Four times a day (QID) | ORAL | 0 refills | 12.00000 days | Status: SS | PRN
Start: 2022-11-19 — End: ?

## 2022-11-19 MED ORDER — CARBOXYMETHYLCELLULOSE SODIUM 1 % EYE GEL IN A DROPPERETTE
Freq: Four times a day (QID) | OPHTHALMIC | 0 refills | 300.00000 days | Status: SS
Start: 2022-11-19 — End: ?

## 2022-11-19 MED ADMIN — ondansetron (ZOFRAN) injection 4 mg: 4 mg | INTRAVENOUS | @ 23:00:00

## 2022-11-19 MED ADMIN — naloxegol (MOVANTIK) 12.5 mg tablet 12.5 mg: 12.5 mg | ORAL | @ 14:00:00 | Stop: 2022-11-19

## 2022-11-19 MED ADMIN — gabapentin (NEURONTIN) capsule 100 mg: 100 mg | ORAL | @ 14:00:00 | Stop: 2022-11-19

## 2022-11-19 MED ADMIN — carboxymethylcellulose sodium (REFRESH CELLUVISC) 1 % ophthalmic gel 1 drop: 1 [drp] | OPHTHALMIC | @ 03:00:00

## 2022-11-19 MED ADMIN — lidocaine (ASPERCREME) 4 % 2 patch: 2 | TRANSDERMAL | @ 14:00:00 | Stop: 2022-11-19

## 2022-11-19 MED ADMIN — buprenorphine-naloxone (SUBOXONE) 2-0.5 mg SL film 1 mg of buprenorphine: .5 | SUBLINGUAL | @ 03:00:00

## 2022-11-19 MED ADMIN — buprenorphine-naloxone (SUBOXONE) 2-0.5 mg SL film 1 mg of buprenorphine: .5 | SUBLINGUAL | @ 18:00:00 | Stop: 2022-11-19

## 2022-11-19 MED ADMIN — promethazine (PHENERGAN) 6.5 mg in sodium chloride (NS) 0.9 % 50 mL IVPB: 6.5 mg | INTRAVENOUS | @ 11:00:00 | Stop: 2022-11-19

## 2022-11-19 MED ADMIN — pantoprazole (Protonix) EC tablet 40 mg: 40 mg | ORAL | @ 14:00:00 | Stop: 2022-11-19

## 2022-11-19 MED ADMIN — HYDROmorphone (PF) (DILAUDID) injection 0.5 mg: .5 mg | INTRAVENOUS | @ 06:00:00 | Stop: 2022-11-19

## 2022-11-19 MED ADMIN — HYDROmorphone (PF) (DILAUDID) injection 0.5 mg: .5 mg | INTRAVENOUS | @ 23:00:00 | Stop: 2022-11-24

## 2022-11-19 MED ADMIN — diclofenac sodium (VOLTAREN) 1 % gel 2 g: 2 g | TOPICAL | @ 16:00:00 | Stop: 2022-11-19

## 2022-11-19 MED ADMIN — promethazine (PHENERGAN) 6.5 mg in sodium chloride (NS) 0.9 % 50 mL IVPB: 6.5 mg | INTRAVENOUS | @ 04:00:00 | Stop: 2022-11-19

## 2022-11-19 MED ADMIN — gabapentin (NEURONTIN) capsule 100 mg: 100 mg | ORAL | @ 18:00:00 | Stop: 2022-11-19

## 2022-11-19 MED ADMIN — HYDROmorphone (PF) (DILAUDID) injection 0.5 mg: .5 mg | INTRAVENOUS | @ 11:00:00 | Stop: 2022-11-19

## 2022-11-19 MED ADMIN — buprenorphine-naloxone (SUBOXONE) 2-0.5 mg SL film 1 mg of buprenorphine: .5 | SUBLINGUAL | @ 14:00:00 | Stop: 2022-11-19

## 2022-11-19 MED ADMIN — mirtazapine (REMERON) tablet 15 mg: 15 mg | GASTROENTERAL | @ 03:00:00

## 2022-11-19 MED ADMIN — famotidine (PEPCID) tablet 20 mg: 20 mg | ORAL | @ 03:00:00

## 2022-11-19 MED ADMIN — Nirogacestat (Ogsiveo) tablet 100 mg ***Patient Supplied***: 100 mg | ORAL | @ 14:00:00 | Stop: 2022-11-19

## 2022-11-19 MED ADMIN — multivitamin with folic acid 400 mcg tablet 1 tablet: 1 | ORAL | @ 14:00:00 | Stop: 2022-11-19

## 2022-11-19 MED ADMIN — gabapentin (NEURONTIN) capsule 100 mg: 100 mg | ORAL | @ 03:00:00

## 2022-11-19 MED ADMIN — diazePAM (VALIUM) tablet 5 mg: 5 mg | ORAL | @ 03:00:00

## 2022-11-19 MED ADMIN — oxyCODONE (ROXICODONE) immediate release tablet 15 mg: 15 mg | ORAL | @ 03:00:00 | Stop: 2022-11-28

## 2022-11-19 MED ADMIN — ondansetron (ZOFRAN) injection 4 mg: 4 mg | INTRAVENOUS | @ 14:00:00 | Stop: 2022-11-19

## 2022-11-19 MED ADMIN — HYDROmorphone (PF) (DILAUDID) injection 0.5 mg: .5 mg | INTRAVENOUS | @ 18:00:00 | Stop: 2022-11-19

## 2022-11-19 MED ADMIN — pantoprazole (Protonix) EC tablet 40 mg: 40 mg | ORAL | @ 03:00:00

## 2022-11-19 MED ADMIN — diazePAM (VALIUM) tablet 2.5 mg: 2.5 mg | ORAL | @ 14:00:00 | Stop: 2022-11-19

## 2022-11-19 MED ADMIN — Nirogacestat (Ogsiveo) tablet 100 mg ***Patient Supplied***: 100 mg | ORAL | @ 01:00:00 | Stop: 2022-11-25

## 2022-11-19 MED ADMIN — diazePAM (VALIUM) tablet 2.5 mg: 2.5 mg | ORAL | @ 18:00:00 | Stop: 2022-11-19

## 2022-11-19 MED ADMIN — promethazine (PHENERGAN) 6.5 mg in sodium chloride (NS) 0.9 % 50 mL IVPB: 6.5 mg | INTRAVENOUS | @ 18:00:00 | Stop: 2022-11-19

## 2022-11-19 NOTE — Unmapped (Signed)
Hospital Medicine Progress Note    Assessment/Plan:    Principal Problem:    Other acute postprocedural pain  Active Problems:    [name]    Intestinal polyps    [name]    Neoplasm related pain    Physical deconditioning    History of colectomy    Intractable abdominal pain    Generalized anxiety disorder with panic attacks    SBO (small bowel obstruction) (CMS-HCC)    Small intestinal bacterial overgrowth (SIBO)      [name] [name] is a 22 y.o. y/o female that presents to [facility] with Other acute postprocedural pain.    Acute on chronic pain/acute back pain secondary to MSK injury/abdominal desmoid fibromatosis:  Pain continues to be limiting, in legs, abdomen, but is overall stable. No obvious reversible triggers, suspect relates primarily to her recent muscular issues and Gardner's syndrome.  Continues to take hydromorphone 4x daily, with some use of PO oxycodone. Appreciate Chronic Pain evaluation continued assistance.  Plan:  -Anesthesia pain consult following  -Continue Suboxone 2/0.5 mg SL film tid  -Continue hydromorphone IV 0.5 mg every 6 hours if needed -- wean as tolerated  -Continue to encourage use of oxycodone 10 to 15 mg every 4 hours as needed over IV  -Continue lidocaine patches  -Continue gabapentin 100 mg TID    Muscle spasm and stiffness/difficulty with ambulation:  Initial presenting complaint.  Has improved from admission with start of diazepam but continues to have some symptoms. Have tapered and changed baclofen to prn without change in symptoms. She continues to note difficulty with gait, especially with knees and feeling of buckling. Diagnosis remains unclear. Workup included MRI of spine and brain, EMG and autoimmune myopathy panel all negative. Paraneoplastic antibody panel notable for a slightly elevated PQ type calcium channel antibody of unclear significance.  Neurology and PM&R do not feel this is consistent with stiff man syndrome especially in light of EMG findings.   Plan:  -PM&R following, appreciate reccs - pending availability  -Per PM&R, okay for corpack tube feeds and NON tunneled right internal jugular central venous triple lumen line to be continued while at rehab.   -The above was dicussed with patient and mother. She cannot go home after rehab with a non tunneled central line in right internal jugular vein. She has had thromboses with PICC lines in past and thus did have a tunneled right internal jugular line before it was eventually removed due to acute infection during this admission and subsequently exchanged for the non tunneled central line.   -At this time her main IV medication is dilaudid and antiemetics. Ideally obtaining PIV but there has been reported much difficulty maintaining peripheral IV access for her.   -Continue baclofen 10 mg 3 times daily as needed for spasms  -Continue diazepam at current doses  -Continue gabapentin  -Knee patellar braces to assist with ambulation - will request again from CD  -PT/OT recommending 5 times high intensity, working towards AIR    Nausea/malnutrition  ND tube functioning well. Having regular BMs and ileus appears to be resolved. Previously has required TPN. Intake has gradually improved  -Nutrition following, decreased total calories via 87ml/hr x13 hours on 9/7. Will continue to limit tubefeeds to encourage PO intake, and to document PO intake to help guide nutrition plan. Expect RD can continue to follow and make recommendations at [facility]. Could consult GI there if needed.   -Continue bowel regimen  -Continue naloxegol  [name]/desmoid fibromatosis:  Follows with Dr. [name] of [facility] oncology.  Had recent triamcinolone and bupivacaine injection of the RUE and left chest tumor by IR on 7/18.  Takes nirogacestat 150 mg twice daily but this has been held intermittently due to other medical reasons.  Plan was to start 50 mg twice daily and titrate up by 50 mg each week until back at usual dose.  LFTs were mildly elevated but since normalized, without a clear etiology.  -Increased nirogacestat to 100 mg twice daily (started 9/3, increased 9/10, then increase to 150mg  BID on 9/17)    Generalized anxiety disorder/depression:  Previously on lorazepam which has been changed to diazepam after discussion with psychiatry for stiffness. Psychiatry has been following   -Continue diazepam at current dosing  -Continue mirtazapine    Enterobacteriaceae GNR/Candida infection related to tunneled line, resolved  Line now removed.  Repeat cultures negative.  Underwent TEE x 2 without evidence of vegetations.  Optho evaluation on 8/15 without evidence of infection.  Completed treatment with cephalosporin and fluconazole on 8/31.  -No further treatment at this time    Iron deficiency anemia:  Received 1 g ferumoxytol during this hospitalization.  Of note, has a history of Fe hypersensitivity reactions and evaluated by [facility].  Hb and MCV  Received cetirizine for allergic symptoms but now stopped.   -No further treatment    FEN: Regular Diet as tolerated. Cycling Tfs over 13 hours to encourage intake during the day.   PPx:  Patient declines chemical prophylaxis due to bleeding risk.    Code: FULL       I personally spent 65 minutes face-to-face and non-face-to-face in the care of this patient, which includes all pre, intra, and post visit time on the date of service.  All documented time was specific to the E/M visit and does not include any procedures that may have been performed.    ___________________________________________________________________    Subjective:  Patient doing okay. Pain is tolerable and tube feeds have not caused significant nausea. She denies fevers, dyspnea, spasms, swelling.     Objective:    Vital signs in last 24 hours:  Temp:  [36.5 ??C (97.7 ??F)-36.7 ??C (98.1 ??F)] 36.5 ??C (97.7 ??F)  Heart Rate:  [86-135] 95  Resp:  [16-17] 17  BP: (100-118)/(58-77) 118/71  MAP (mmHg):  [71-89] 83  SpO2:  [92 %-100 %] 100 %    Intake/Output last 3 shifts:  I/O last 3 completed shifts:  In: -   Out: 5050 [Urine:5050]    Physical Exam:  GEN: In bed, no distress  EYES: Anicteric  ENT: OP moist, NG in place  CV:  RRR, normal S1, S2, no murmur  PULM:  Moderate effort, no crackles or wheezes  ABD: good bowel sound  Extremities: stable stiffness in BLE.  Bilateral knees without effusions.     Medications:  Scheduled Meds:   buprenorphine-naloxone  0.5 Film Sublingual TID    carboxymethylcellulose sodium  1 drop Both Eyes QID    diazePAM  2.5 mg Oral Daily    And    diazePAM  2.5 mg Oral Daily    diazePAM  5 mg Oral Nightly    diclofenac sodium  2 g Topical QID    famotidine  20 mg Oral Nightly    gabapentin  100 mg Oral TID    HYDROmorphone  0.5 mg Intravenous Once    lidocaine  2 patch Transdermal Daily    mirtazapine  15 mg Enteral  tube: gastric Nightly    multivitamins (ADULT)  1 tablet Oral Daily    naloxegol  12.5 mg Oral Daily    nirogacestat  100 mg Oral BID    [START ON 11/25/2022] nirogacestat  150 mg Oral BID    pantoprazole  40 mg Oral BID     Continuous Infusions:   Chemo Clarification Order      Chemo Clarification Order      IP okay to treat      sodium chloride 10 mL/hr (11/09/22 0314)     PRN Meds:.acetaminophen, alum-mag-simeth, baclofen, bisacodyl, butalbital-acetaminophen-caffeine, carboxymethylcellulose sodium, Chemo Clarification Order, Chemo Clarification Order, diazePAM, guaiFENesin, hydrocortisone, HYDROmorphone, IP okay to treat, lidocaine-diphenhydrAMINE-aluminum-magnesium, ondansetron, oxyCODONE **OR** oxyCODONE, phenol, polyethylene glycol, promethazine, pseudoephedrine, senna, zinc oxide-cod liver oil    Labs/Studies:  Labs/Studies reviewed.  Notable for values below.    Recent Labs     Units 11/13/22  1039 11/15/22  0936 11/17/22  1826   WBC 10*9/L 5.5 4.8 6.4   RBC 10*12/L 3.55* 3.39* 3.74*   HGB g/dL 84.1* 9.5* 66.0*   HCT % 29.5* 28.0* 30.8*   MCV fL 83.0 82.5 82.4   MCH pg 28.2 27.9 28.4   MCHC g/dL 63.0 16.0 10.9   RDW % 17.3* 16.8* 17.0*   PLT 10*9/L 157 151 183   MPV fL 10.3 9.8 9.5

## 2022-11-19 NOTE — Unmapped (Addendum)
[  Inpatient D]    22 y.o. 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  ELOS: ?  [ ]  POC 9/13 by day 3    Follow Up:  [ ]  PCP  [ ]  PM&R [ ]  Chronic Pain Fu ([name])  ---------------------------------  Nt/Wknd:  - if additional pain, use pre-existing prns   - if fevers, full infxn work-up including fungal source

## 2022-11-19 NOTE — Unmapped (Signed)
Physical Medicine and Rehab  Inpatient Consult Note    Requesting Attending Physician: Rocco Serene, MD  Service Requesting Consult: Med [facility])    Thank you for this consult.  Please contact the PM&R consult pager [phone]) for questions regarding these recommendations.  For questions of bed availability at [facility], contact the Intake Office at [phone]. Please contact your case manager for updates on AIR process as they are in regular contact with the rehab intake office and should have the latest information.    *This PM&R consult does not guarantee that patient has been accepted to [facility] AIR, 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 Patient is currently hospitalized since 09/25/2022. The patient is seen in consultation for evaluation of rehabilitation needs.    Checklist for Potential AIR:   [ ]  EDD/what is estimated discharge date?  [ ]  Referral from CM if wants [place] AIR  [ ]  PT/OT within 48 hours of AIR  [ ]  dispo verified and [place] AIR preference  [ ]  CBC/BMP & other pertinent labs within 48 hours of AIR  [ ]  Prefer oral pain meds only, but if on IV pain meds, then no more than q 6-8 hours prn  [ ]  6 minute walk test completed for home oxygen requirement  [ ]  If on oxygen, no more than 4 LPM   [ ]  Currently needing TF via NGT.  May need to discharge to home with NGT. Feeding plan ongoing     Dispo: home with family. She previously was independent with basic ADLs. Has MSK limitations which required some assistance. She is in RN school    Functional Impairments and Recommendations  - Please continue to have patient work with PT, OT, and SLP to maximize functional status with mobility, ADLs, and cogniton/swallow function.  - Insurance authorization pending  - Fall risk precautions  - Recommend OOB to chair daily with assistance if appropriate    Principal Medical Problem:  Other acute postprocedural pain    Increased Tonicity  Dystonia   Started post-procedurally per patient ~09/25/22. Occurs intermittently throughout the day with a heightened occurrence during mobilization. At rest patient bilateral ankles in plantarflexion with worsening heel cord tightening. Has tonicity most prominent to HF and KE that is not velocity dependent. Has not noted significant improvement with increase of baclofen to 15 mg TID in setting of infection as below. Differential wise, Neurology previously evaluated patient seemingly ruling out an acute dystonic reaction, cord compression, central process, nutritional deficiency, stiff person syndrome and [name] after normal EMG on 8/22. Continues to be well controlled with valium, small improvement with baclofen PRN.  - Continue Baclofen 10 mg TID prn  - Continued Valium 2.5/2.5/5 mg TID and 2.5 mg nightly PRN  - At risk for contractures, Recommend ordering bilateral hard PRAFO boots  - Recommend OOB to chair daily with assistance if appropriate     Headache  Currently managed with tylenol. She does have some posterior neck muscle tightness. Notes ketamine is helpful for headache pains. Describes photophobia and phonophobia with events. I discussed with patient our hope is to decrease risk  of rebound headaches and limit medical issues which will interfere with therapy. An additional thought is patient's headaches could be cervicogenic. She does have loss of cervical lordosis on most recent MRI.   - Given patient with elevated heart rate, frequent headaches, and has limited therapy participation 2/2 tachycardia in setting of continuous headaches  - Consider starting propranolol 5 mg BID for headache prophylaxis.   - Prn tylenol +/- prn fiorcet   - Lidocaine cream PRN to area of pain for cervicogenic headaches    Pain  Has a chronic pain provider. Pain management consulted with recommendations as below  - Tylenol 650 mg q6h PRN  - IV Dilaudid 0.5 mg q6h PRN  - Oxyocodone 10-15 mg q4h PRN  - Suboxone 2-0.5 mg TID  - Continue Gabapentin 100 mg TID, can increase to 300mg  TID as tolerated  - Voltaren gel QID, lidocaine patches  - Continue patellar taping with therapy or obtain patellar brace to help with pain with ambulation    Multifactorial Ileus   [place] Emesis 2/2 NGT placement (resolved)  Contributors with history of narcotics, poor PO intake. Concern for malnutrition. Improvements to abdominal pain since weaning opioids. TPN has been discontinued with patient now tolerating tube feeds.  Working on decreasing tube feeds to tolerate p.o. intake  -Continue Tube feeds as guided by nutrition. May discharge home with corpak  -bowel regimen: Naloxegol daily  -NG tube in place  -Trialing food/meds PO prior to NG tube removal   -PO Protonix 40 mg BID     Syncope  Multifactorial. Primary team ddx includes: vasovagal vs adverse effect of medications vs deconditioning vs iron deficiency. Last echo 04/2022  - CTM    IDA  -has allergy to formula iron     [name] - Desmoid Tumors  Follows [facility] (Dr. [name]) is patient's outpatient oncologist.   -Continue nirogacestat 150 mg BID    Enterobacteriaceae group bacteremia (Resolved) - C albicans fungemia (Resolved)   Likely due to RIJ tunneled line placed 01/2022 now removed on 8/14.  8/13, 8/17 repeat blood cultures NGTD.  No evidence of vitreitis, retinitis 8/15 per Ophtho.  TTE 8/15 w possible vegetations on MV and TV or indwelling catheter; however, TEE 8/20 w/o vegetations. ID consulted with recommendations as below. Completed treatment with cephalosporin and fluconazole on 8/31.     Generalized anxiety disorder, depression, h/o panic attacks  Psych consulted with recommendations as below.   - Consider decreasing mirtazapine 15 mg nightly to 7.5 mg nightly due to somnolence  - Valium as above, If needed could give 2.5mg  PRN for acute anxiety.    Other Medical Considerations:  While in AIR, the patient has medical issues requiring active management/supervision by a rehabilitation physician, with face-to-face visits at least 3 days per week to assess the patient both medically and functionally and to modify the course of treatment as needed; The Physiatrist leads an intensive and coordinated interdisciplinary team approach to the delivery of inpatient rehabilitative care that cannot be provided in a less intensive setting such as the home, outpatient center or SNF. If not done in hospital, patient is at high risk for falls, respiratory failure, bleeding, infections, or delirium.    Risk for VTE    Patient's Functional Goals  Increase mobility, return to RN school    Functional Status Updates:  Prior Functional Status:    Prior Functional Status: Pt reports following her last hospitalization she was able to go to the beach briefly and ambulate  on the sand. Was also able to play mini-golf but unable to bend down to grab a ball. Since her egg retrieval and the initiation of her new chemo medication, she has been spending most of her time in bed. No falls in the past month, but did have a few falls last hospitalization due to syncope.    Current Functional Status: Therapy notes reviewed and analyzed   Activities of Daily Living:   Assessment  Problem List: Decreased strength, Impaired ADLs, Decreased activity tolerance, Decreased mobility, Fall risk, Gait deviation, Impaired balance, Pain, Decreased endurance, Decreased coordination  Assessment: Pt seen for progression of short term goals. Pt able to complete hair washing at bed level with max A, working towards OT goals. Pt continues to present with above deficits impacting safe and independent ADL participation. Pt continues to present below baseline and would continue to benefit from skilled OT services to maximize functional independence and safety.  Today's Interventions: ADL retraining, Balance activities, Bed mobility, Compensatory tech. training, Conservation, Education - Patient, Education - Family / caregiver, Endurance activities, Safety education  Chlorhexidine Treatment: Given  Today's Interventions: Pt educated on role of OT, OT POC, importance of OOB activity and ADL participation with Engineer, manufacturing. All patient questions answered appropriately and within scope of practice.     OT Post Acute Discharge Recommendations: 5x weekly, High intensity   OT DME Recommendations: Defer to post acute  Mobility:   Bed Mobility comments: Supine to sit with SBA, HOB elevated and increased time to complete. Pt utilizing LUE for support but protecting R wrist. Sit to supine with Min A for LEs.        Skilled Treatment Performed: see gait goal  PT Post Acute Discharge Recommendations: Skilled PT services indicated, 5x weekly, High intensity  Cognition, Swallow, Speech:   Cognition / Swallow / Speech  Patient's Vision Adequate to Safely Complete Daily Activities: Yes  Patient's Judgement Adequate to Safely Complete Daily Activities: Yes  Patient's Memory Adequate to Safely Complete Daily Activities: Yes  Patient Able to Express Needs/Desires: Yes  Patient has speech problem: No           Assistive Devices: Rollator  Precautions:  Safety Interventions  Safety Interventions: fall reduction program maintained, family at bedside, infection management, low bed  Aspiration Precautions: awake/alert before oral intake, upright posture maintained, tube feeding placement verified  Bleeding Precautions: blood pressure closely monitored, monitored for signs of bleeding  Infection Management: aseptic technique maintained  Isolation Precautions: protective precautions maintained  Latex Precautions: latex precautions maintained    Rehabilitation Plan of Care  - Patient has complex rehab, nursing, and medical needs and is appropriate for Acute Inpatient Rehabilitation.  In AIR the patient would be expected to tolerate minimum of 3 hrs of therapy daily, 5x/week.    -Discussed rehab options today with patient and patient's mother. The patient/family/caregiver was counseled and educated on the purpose of Acute Inpatient Rehabilitation and questions/concerns were addressed.     In order for the patient to be considered for admission to [facility] inpatient rehab, the case manager must give the patient / family choice in post-acute discharge options. If the patient desires [place], a referral from case management is required for acceptance to [facility] inpatient rehab.  A referral for [facility] inpatient rehab has not been received.     Please page the consult pager from 8AM-5PM weekdays or the first call pager after hours/weekends/holidays with questions.    The patient was seen and examined with Dr. French Ana  Renae Fickle, MD, who is in agreement with the above stated assessment and plan.    Thank you for this consult    SUBJECTIVE     Reason for Consult: Patient seen in consultation at the request of Milan Lupita Raider, MD for evaluation of rehabilitation needs and recommendations.    History of Present Illness: [name] Reather Converse is a 22 y.o. female currently hospitalized on 09/25/2022 for ileus and inability to tolerate PO intake.    Today  Patient seen sleeping but easily awakes to voice. States that she has been drowsy with use of mirtazapine 15 mg. States that pain is currently well controlled. Discussed goal to wean down IV pain meds in favor of oral which patient states she will continue to monitor. Was able to get braces yesterday for knees and feels that it has helped with discomfort. Continues to find benefit from valium and increased tone in legs is not worse with holding baclofen. Continues to improve PO intake with decrease in tube feeds. Medical / Surgical History:   Past Medical History:   Diagnosis Date    Abdominal pain     Acid reflux     occas    Anesthesia complication     itching, shaking, coldness; last few surgeries have gone much better    Cancer (CMS-HCC)     Cataract of right eye     COVID-19 virus infection 01/2019    Cyst of thyroid determined by ultrasound     monitoring    [name]     2 right forearm, 1 left thigh, 1 right scapula, 1 under left clavicle; multiple    Difficult intravenous access     [name] (familial adenomatous polyposis)     [name]     Gastric polyps     History of chemotherapy     last treatment approx 05/2019    History of colon polyps     History of COVID-19 01/2019    Iron deficiency anemia due to chronic blood loss     received iron infusion 11-2019    PONV (postoperative nausea and vomiting)     Rectal bleeding     Syncopal episodes     especially if becoming dehydrated     Past Surgical History:   Procedure Laterality Date    COLON SURGERY      cyroablation      cystis removal      desmoid removal      PR CLOSE ENTEROSTOMY,RESEC+ANAST N/A 10/09/2020    Procedure: ILEOSTOMY TAKEDOWN;  Surgeon: Mickle Asper, MD;  Location: OR [place];  Service: General Surgery    PR COLONOSCOPY W/BIOPSY SINGLE/MULTIPLE N/A 10/27/2012    Procedure: COLONOSCOPY, FLEXIBLE, PROXIMAL TO SPLENIC FLEXURE; WITH BIOPSY, SINGLE OR MULTIPLE;  Surgeon: Shirlyn Goltz Mir, MD;  Location: PEDS PROCEDURE ROOM [place];  Service: Gastroenterology    PR COLONOSCOPY W/BIOPSY SINGLE/MULTIPLE N/A 09/14/2013    Procedure: COLONOSCOPY, FLEXIBLE, PROXIMAL TO SPLENIC FLEXURE; WITH BIOPSY, SINGLE OR MULTIPLE;  Surgeon: Shirlyn Goltz Mir, MD;  Location: PEDS PROCEDURE ROOM [place];  Service: Gastroenterology    PR COLONOSCOPY W/BIOPSY SINGLE/MULTIPLE N/A 11/08/2014    Procedure: COLONOSCOPY, FLEXIBLE, PROXIMAL TO SPLENIC FLEXURE; WITH BIOPSY, SINGLE OR MULTIPLE;  Surgeon: Arnold Long Mir, MD;  Location: PEDS PROCEDURE ROOM [facility];  Service: Gastroenterology    PR COLONOSCOPY W/BIOPSY SINGLE/MULTIPLE N/A 12/26/2015    Procedure: COLONOSCOPY, FLEXIBLE, PROXIMAL TO SPLENIC FLEXURE; WITH BIOPSY, SINGLE OR MULTIPLE;  Surgeon: Arnold Long Mir, MD;  Location: PEDS PROCEDURE  ROOM [facility];  Service: Gastroenterology    PR COLONOSCOPY W/BIOPSY SINGLE/MULTIPLE N/A 09/02/2017    Procedure: COLONOSCOPY, FLEXIBLE, PROXIMAL TO SPLENIC FLEXURE; WITH BIOPSY, SINGLE OR MULTIPLE;  Surgeon: Arnold Long Mir, MD;  Location: PEDS PROCEDURE ROOM [facility];  Service: Gastroenterology    PR COLSC FLX W/REMOVAL LESION BY HOT BX FORCEPS N/A 08/27/2016    Procedure: COLONOSCOPY, FLEXIBLE, PROXIMAL TO SPLENIC FLEXURE; W/REMOVAL TUMOR/POLYP/OTHER LESION, HOT BX FORCEP/CAUTE;  Surgeon: Arnold Long Mir, MD;  Location: PEDS PROCEDURE ROOM [facility];  Service: Gastroenterology    PR COLSC FLX W/RMVL OF TUMOR POLYP LESION SNARE TQ N/A 02/25/2019    Procedure: COLONOSCOPY FLEX; W/REMOV TUMOR/LES BY SNARE;  Surgeon: Helyn Numbers, MD;  Location: GI PROCEDURES [place] [facility];  Service: Gastroenterology    PR COLSC FLX W/RMVL OF TUMOR POLYP LESION SNARE TQ N/A 03/13/2020    Procedure: COLONOSCOPY FLEX; W/REMOV TUMOR/LES BY SNARE;  Surgeon: Helyn Numbers, MD;  [facility] [facility];  Service: Gastroenterology    PR EXC SKIN BENIG 2.1-3 CM TRUNK,ARM,LEG Right 02/25/2017    Procedure: EXCISION, BENIGN LESION INCLUDE MARGINS, EXCEPT SKIN TAG, LEGS; EXCISED DIAMETER 2.1 TO 3.0 CM;  Surgeon: Clarene Duke, MD;  Location: CHILDRENS OR [facility];  Service: Plastics    PR EXC SKIN BENIG 3.1-4 CM TRUNK,ARM,LEG Right 02/25/2017    Procedure: EXCISION, BENIGN LESION INCLUDE MARGINS, EXCEPT SKIN TAG, ARMS; EXCISED DIAMETER 3.1 TO 4.0 CM;  Surgeon: Clarene Duke, MD;  Location: CHILDRENS OR [facility];  Service: Plastics    PR EXC SKIN BENIG >4 CM FACE,FACIAL Right 02/25/2017    Procedure: EXCISION, OTHER BENIGN LES INCLUD MARGIN, FACE/EARS/EYELIDS/NOSE/LIPS/MUCOUS MEMBRANE; EXCISED DIAM >4.0 CM;  Surgeon: Clarene Duke, MD;  Location: CHILDRENS OR [facility];  Service: Plastics    PR EXC TUMOR SOFT TISSUE LEG/ANKLE SUBQ 3+CM Right 08/05/2019    Procedure: EXCISION, TUMOR, SOFT TISSUE OF LEG OR ANKLE AREA, SUBCUTANEOUS; 3 CM OR GREATER;  Surgeon: Arsenio Katz, MD;  Location: MAIN OR [place];  Service: Plastics    PR EXC TUMOR SOFT TISSUE LEG/ANKLE SUBQ <3CM Right 08/05/2019    Procedure: EXCISION, TUMOR, SOFT TISSUE OF LEG OR ANKLE AREA, SUBCUTANEOUS; LESS THAN 3 CM;  Surgeon: Arsenio Katz, MD;  Location: MAIN OR [facility];  Service: Plastics    PR LAP, SURG PROCTECTOMY W J-POUCH N/A 08/10/2020    Procedure: ROBOTIC ASSISTED LAPAROSCOPIC PROCTOCOLECTOMY, ILEAL J POUCH, WITH OSTOMY;  Surgeon: Mickle Asper, MD;  Location: OR [place];  Service: General Surgery    PR NDSC EVAL INTSTINAL POUCH DX W/COLLJ SPEC SPX N/A 01/23/2021    Procedure: ENDO EVAL SM INTEST POUCH; DX;  Surgeon: Modena Nunnery, MD;  Location: [facility]EDURES [place] [facility];  Service: Gastroenterology    PR NDSC EVAL INTSTINAL POUCH DX W/COLLJ SPEC SPX N/A 08/27/2021    Procedure: ENDO EVAL SM INTEST POUCH; DX;  Surgeon: Hunt Oris, MD;  Location: [facility]URES [facility];  Service: Gastroenterology    PR NDSC EVAL INTSTINAL POUCH DX W/COLLJ SPEC SPX N/A 12/09/2021    Procedure: ENDO EVAL SM INTEST POUCH; DX;  Surgeon: Vidal Schwalbe, MD;  Location: GI PROCEDURES [facility];  Service: Gastroenterology    PR NDSC EVAL INTSTINAL POUCH DX W/COLLJ [facility] SPX Left 04/09/2022    Procedure: ENDO EVAL SM INTEST POUCH; DX;  Surgeon: Modena Nunnery, MD;  Location: [facility] [place] [facility];  Service: Gastroenterology    PR NDSC EVAL INTSTINAL POUCH DX W/COLLJ SPEC SPX N/A 08/05/2022  Procedure: ENDO EVAL SM INTEST POUCH; DX;  Surgeon: Modena Nunnery, MD;  Location: GI PROCEDURES [facility];  Service: Gastroenterology    PR NDSC EVAL INTSTINAL POUCH W/BX SINGLE/MULTIPLE N/A 01/20/2022    Procedure: ENDOSCOPIC EVAL OF SMALL INTESTINAL POUCH; DIAGNOSTIC, No biopsies;  Surgeon: Andrey Farmer, MD;  Location: GI PROCEDURES [facility];  Service: Gastroenterology    PR NDSC EVAL INTSTINAL POUCH W/BX SINGLE/MULTIPLE N/A 02/13/2022    Procedure: ENDOSCOPIC EVAL OF SMALL INTESTINAL POUCH; DIAGNOSTIC, WITH BIOPSY;  Surgeon: Bronson Curb, MD;  Location: GI PROCEDURES [facility];  Service: Gastroenterology    PR UNLISTED PROCEDURE SMALL INTESTINE  01/23/2021    Procedure: UNLISTED PROCEDURE, SMALL INTESTINE;  Surgeon: Modena Nunnery, MD;  [facility]: [facility] [place] [facility];  Service: Gastroenterology    PR UNLISTED PROCEDURE SMALL INTESTINE  02/13/2022    Procedure: UNLISTED PROCEDURE, SMALL INTESTINE;  Surgeon: Bronson Curb, MD;  Location: GI PROCEDURES [facility];  Service: Gastroenterology    PR UPPER GI ENDOSCOPY,BIOPSY N/A 10/27/2012    Procedure: UGI ENDOSCOPY; WITH BIOPSY, SINGLE OR MULTIPLE;  Surgeon: Shirlyn Goltz Mir, MD;  Location: PEDS PROCEDURE ROOM [facility];  Service: Gastroenterology    PR UPPER GI ENDOSCOPY,BIOPSY N/A 09/14/2013    Procedure: UGI ENDOSCOPY; WITH BIOPSY, SINGLE OR MULTIPLE;  Surgeon: Shirlyn Goltz Mir, MD;  Location: PEDS PROCEDURE ROOM [facility];  Service: Gastroenterology    PR UPPER GI ENDOSCOPY,BIOPSY N/A 11/08/2014    Procedure: UGI ENDOSCOPY; WITH BIOPSY, SINGLE OR MULTIPLE;  Surgeon: Arnold Long Mir, MD;  Location: PEDS PROCEDURE ROOM [facility];  Service: Gastroenterology    PR UPPER GI ENDOSCOPY,BIOPSY N/A 12/26/2015    Procedure: UGI ENDOSCOPY; WITH BIOPSY, SINGLE OR MULTIPLE;  Surgeon: Arnold Long Mir, MD;  Location: PEDS PROCEDURE ROOM [facility];  Service: Gastroenterology    PR UPPER GI ENDOSCOPY,BIOPSY N/A 08/27/2016    Procedure: UGI ENDOSCOPY; WITH BIOPSY, SINGLE OR MULTIPLE;  Surgeon: Arnold Long Mir, MD;  Location: PEDS PROCEDURE ROOM [facility];  Service: Gastroenterology    PR UPPER GI ENDOSCOPY,BIOPSY N/A 09/02/2017    Procedure: UGI ENDOSCOPY; WITH BIOPSY, SINGLE OR MULTIPLE;  Surgeon: Arnold Long Mir, MD;  Location: PEDS PROCEDURE ROOM [facility];  Service: Gastroenterology    PR UPPER GI ENDOSCOPY,BIOPSY N/A 03/13/2020    Procedure: UGI ENDOSCOPY; WITH BIOPSY, SINGLE OR MULTIPLE;  Surgeon: Helyn Numbers, MD;  Location: [facility] [place] [facility];  Service: Gastroenterology    PR UPPER GI ENDOSCOPY,BIOPSY N/A 09/05/2021    Procedure: UGI ENDOSCOPY; WITH BIOPSY, SINGLE OR MULTIPLE;  Surgeon: Wendall Papa, MD;  Location: GI PROCEDURES [facility];  Service: Gastroenterology    PR UPPER GI ENDOSCOPY,DIAGNOSIS N/A 01/20/2022    Procedure: UGI ENDO, INCLUDE ESOPHAGUS, STOMACH, & DUODENUM &/OR JEJUNUM; DX W/WO COLLECTION SPECIMN, BY BRUSH OR WASH;  Surgeon: Andrey Farmer, MD;  Location: GI PROCEDURES [facility];  Service: Gastroenterology    TUMOR REMOVAL      multiple-head, neck, back, hand, right flank, multiple        Social History:   Social History     Tobacco Use    Smoking status: Never     Passive exposure: Past    Smokeless tobacco: Never   Vaping Use    Vaping status: Never Used   Substance Use Topics    Alcohol use: Never    Drug use: Never       Living Environment: House  Lives With: Mother, Father  Home Living: Multi-level home, Bed/bath upstairs, 1/2 bath on main  level, Stairs to alternate level with rails, Stairs to enter without rails, Stairs to alternate level without rails, Handicapped height toilet, Tub/shower unit, Able to Live on main level with bedroom/bathroom  Number of Stairs to Enter (outside): 2  Rail placement (inside): Rail on right side  Number of Stairs to Alternate level (inside): 14  Caregiver Identified?: Yes  Caregiver Availability: 24 hours  Caregiver Ability: Limited lifting    Family History: Reviewed  family history includes Alcohol abuse in her paternal grandfather; Arthritis in her maternal grandfather; Asthma in her maternal grandfather; COPD in her paternal grandmother; Cancer in her maternal grandmother; Diabetes in her maternal grandmother; Hypertension in her maternal grandmother; Miscarriages / [place] in her paternal grandmother; No Known Problems in her brother, father, maternal aunt, maternal uncle, mother, paternal aunt, paternal uncle, and sister; Other in her maternal grandmother; Stroke in her maternal grandmother; Thyroid disease in her maternal grandmother.    Allergies:   Adhesive tape-silicones; Ferrlecit [sodium ferric gluconat-sucrose]; Levofloxacin; Methylnaltrexone; Neomycin; Papaya; Morphine; Zosyn [piperacillin-tazobactam]; Compazine [prochlorperazine]; Iron analogues; Iron dextran; and Latex, natural rubber    Medications:   Scheduled    buprenorphine-naloxone (SUBOXONE) 2-0.5 mg SL film 1 mg of buprenorphine TID    carboxymethylcellulose sodium (REFRESH CELLUVISC) 1 % ophthalmic gel 1 drop QID    diazePAM (VALIUM) tablet 2.5 mg Daily    And    diazePAM (VALIUM) tablet 2.5 mg Daily    diazePAM (VALIUM) tablet 5 mg Nightly    diclofenac sodium (VOLTAREN) 1 % gel 2 g QID    famotidine (PEPCID) tablet 20 mg Nightly    gabapentin (NEURONTIN) capsule 100 mg TID    HYDROmorphone (PF) (DILAUDID) injection 0.5 mg Once    lidocaine (ASPERCREME) 4 % 2 patch Daily    mirtazapine (REMERON) tablet 15 mg Nightly    multivitamin with folic acid 400 mcg tablet 1 tablet Daily    naloxegol (MOVANTIK) 12.5 mg tablet 12.5 mg Daily    Nirogacestat (Ogsiveo) tablet 100 mg Patient Supplied BID    pantoprazole (Protonix) EC tablet 40 mg BID     PRN acetaminophen, 650 mg, Q6H PRN  alum-mag-simeth, 60 mL, Q6H PRN  baclofen, 10 mg, TID PRN  bisacodyl, 10 mg, BID PRN  butalbital-acetaminophen-caffeine, 1 tablet, BID PRN  carboxymethylcellulose sodium, 2 drop, QID PRN  Chemo Clarification Order, , Continuous PRN  Chemo Clarification Order, , Continuous PRN  diazePAM, 2.5 mg, Nightly PRN  guaiFENesin, 200 mg, Q4H PRN  hydrocortisone, , BID PRN  HYDROmorphone, 0.5 mg, Q6H PRN  IP okay to treat, , Continuous PRN  lidocaine-diphenhydrAMINE-aluminum-magnesium, 10 mL, QID PRN  ondansetron, 4 mg, Q12H PRN  oxyCODONE, 10 mg, Q4H PRN   Or  oxyCODONE, 15 mg, Q4H PRN  phenol, 2 spray, Q2H PRN  polyethylene glycol, 17 g, BID PRN  promethazine, 6.5 mg, Q6H PRN  pseudoephedrine, 30 mg, Daily PRN  senna, 1 tablet, BID PRN  zinc oxide-cod liver oil, , Daily PRN      Continuous Infusions Chemo Clarification Order  Chemo Clarification Order  IP okay to treat  sodium chloride, Last Rate: 10 mL/hr (11/09/22 0314)        OBJECTIVE     Vitals:  Temp:  [36.5 ??C (97.7 ??F)-36.6 ??C (97.9 ??F)] 36.5 ??C (97.7 ??F)  Heart Rate:  [91-135] 94  Resp:  [17] 17  BP: (112-118)/(62-77) 112/62  MAP (mmHg):  [79-89] 79  SpO2:  [92 %-100 %] 100 %  Vitals:    11/18/22 1900 11/19/22  0005 11/19/22 0027 11/19/22 0745   BP:  118/71  112/62   Pulse: 91 97 95 94   Resp:  17  17   Temp:  36.5 ??C (97.7 ??F)  36.5 ??C (97.7 ??F)   TempSrc:  Oral  Oral   SpO2: 100% 100% 100%    Weight:       Height:           Patient Lines/Drains/Airways Status       Active Peripheral & Central Intravenous Access       Name Placement date Placement time Site Days    CVC Triple Lumen 10/22/22 Non-tunneled Right Internal jugular 10/22/22  1521  Internal jugular  27                  Patient Lines/Drains/Airways Status       Active Wounds       None                    Physical Exam:    GEN: Lying in bed in NAD.  HEENT: Normocephalic. Moist mucous membranes. NGT in place  RESP: NWOB on supplemental O2.   CV: Warm and well-perfused.  GI: non distended   GU: deferred  SKIN: without lesions, bruises, ecchymosis, rashes on clothed examination  MSK: TTP to bilateral knees (R>L) with passive/active range of motion   NEURO:  Mental Status: Alert, attention intact, speech fluid and coherent, follows commands well and answers questions appropriately  Sensory: Sensory is intact to light touch Motor:      Delt Bi Tri WE HG  RUE  5 5 5 5  4+    LUE 5 5 5 5  4+                 HF KE       DF PF        RLE     2 3 2 3               LLE   2 3 1 3       Lower extremity exam limited by pain    Abbreviations:   Delt= Deltoid, Bi= Biceps, Tri= Triceps, WF= Wrist flexor, WE= Wrist extensor, HG= Hand Grip  HF= Hip flexor, KE= Knee Extensor, DF= Dorsiflexor, PF= Plantar Flexor, EHL= Extensor Hallicus Longus    Tone: Stable tone most prominent to bilateral lower extremities with hip flexion, knee flexor & knee extension, tone is variable, there is increasd tone to bilateral gastroc-soleus complex  Reflexes: no clonus    Labs and Diagnostic Studies: Reviewed     Radiology Results: Reviewed   Reviewed MRI CTL    PV[facility] Venous Duplex Lower Extremity Bilateral    Result Date: 11/14/2022   Per[facility] Vascular Lab     [address]   [place], [place] [address]  PVL VENOUS DUPLEX LOWER EXTREMITY BILATERAL Patient Demographics Pt. Name: [name] [name] Location: [facility] Inpatient Lab MRN:      [id number]               Sex:      F DOB:      [birth year]              Age:      21 years  Study Information Authorizing         [id number] DAVID FARID     Performed Time       11/13/2022 10:35:16 Provider Name  [facility]                                     PM Ordering Physician  Candace Cruise [facility]        Patient Location     [facility] Accession Number    [id number] UN         Technologist         Marcy Siren RVT Diagnosis:                                Dispensing optician Ordered Reason For Exam: swelling, bed bound Indication: Risk Factors: Chemotherapy, immobility and (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 Protocol The major deep veins from the inguinal ligament to the ankle are assessed for bilaterally for compressibility and color and spectral Doppler flow characteristics. The assessed veins include bilateral common femoral vein, femoral vein in the thigh, popliteal vein, and intramuscular calf veins. The iliac vein is assessed indirectly using Doppler waveform analysis. The great saphenous vein is assessed for compressibility at the saphenofemoral junction, and the small saphenous vein assessed for compressibility behind the knee.  Final Interpretation Right There is no evidence of DVT in the lower extremity. There is no evidence of obstruction proximal to the inguinal ligament or in the common femoral vein. Left There is no evidence of DVT in the lower extremity. There is no evidence of obstruction proximal to the inguinal ligament or in the common femoral vein.  Electronically signed by [id number] Jodell Cipro MD on 11/14/2022 at 8:22:18 AM.  -------------------------------------------------------------------------------- Right Duplex Findings All veins visualized appear fully compressible. Doppler flow signals demonstrate normal spontaneity, phasicity, and augmentation.  Left Duplex Findings All veins visualized appear fully compressible. Doppler flow signals demonstrate normal spontaneity, phasicity, and augmentation. Right Technical Summary No evidence of deep venous obstruction in the lower extremity. No indirect evidence of obstruction proximal to the inguinal ligament. Left Technical Summary No evidence of deep venous obstruction in the lower extremity. No indirect evidence of obstruction proximal to the inguinal ligament.  Final     XR Abdomen 1 View    Result Date: 11/13/2022  EXAM: XR ABDOMEN 1 VIEW ACCESSION: [id number] UN     CLINICAL INDICATION: 22 years old with CATHETER NON VASCULAR FIT&ADJ      COMPARISON: Abdominal radiograph dated 10/28/2022     TECHNIQUE: Upright view of the abdomen, 1 image(s)     FINDINGS: Weighted esophagogastric tube is unchanged in position with distal tip overlying the second portion of the duodenum. No dilated bowel loops in the visualized upper abdomen. No acute osseous abnormalities. Lung bases are clear.         Similar positioning of the weighted esophagogastric tube with tip overlying the second portion of the duodenum.         XR Knee 1 or 2 Views Bilateral    Result Date: 11/12/2022  EXAM: XR KNEE 1 OR 2 VIEWS BILATERAL DATE: 11/12/2022 2:39 PM ACCESSION: [id number] UN DICTATED: 11/12/2022 3:10 PM INTERPRETATION LOCATION: [facility]     CLINICAL INDICATION: 22 years old Female with pain and swelling  with standing    COMPARISON: None provided.     TECHNIQUE: AP and crosstable lateral views of the knees.     FINDINGS: No acute fractures. Normal joint alignment. Preserved joint spaces. Normal soft tissues.         Normal radiographs of the knees         CT Lumbar Spine W Contrast    Result Date: 11/11/2022  EXAM: Computed tomography, lumbar spine with contrast material. DATE: ACCESSION: [id number] UN DICTATED: 11/11/2022 9:31 AM INTERPRETATION LOCATION: [facility]     CLINICAL INDICATION: 22 years old Female with Severe low back pain, recent bloodstream infection      COMPARISON: Concurrent CT pelvis, CT abdomen/pelvis 10/21/2022     TECHNIQUE: Axial CT images through the lumbar spine with contrast. Coronal and sagittal reformatted images, bone and soft tissue algorithm are provided.     FINDINGS:  There is no fracture. The vertebrae are normally aligned. Nonspecific mild stranding posterior to the paraspinal lumbar spine muscles.     No abnormal contrast enhancement.         No acute abnormalities of the lumbar spine.         CT Pelvis W  Contrast    Result Date: 11/11/2022  EXAM: CT PELVIS W  CONTRAST ACCESSION: [id number] UN CLINICAL INDICATION: Severe low back and pelvic pain, complicated abdominal history with recent bloodstream infection.      COMPARISON: CT abdomen pelvis 10/21/2022     TECHNIQUE: A helical CT scan of the pelvis was obtained with IV contrast from the iliac crests through the pubic symphysis. Images were reconstructed in the axial plane. Coronal and sagittal reformatted images were also provided for further evaluation.     FINDINGS:     LINES AND TUBES: Tip of weighted enteric tube with associated streak artifact noted within the partially visualized duodenum.     BLADDER: Distended, but otherwise unremarkable in appearance.     PELVIC/REPRODUCTIVE ORGANS: Uterus is present.     GI TRACT: Sequelae of proctocolectomy with ileoanal anastomosis. Redemonstration of moderate dilatation at the proximal aspect of the ileoanal anastomosis, which is less prominent compared to the prior study.     PERITONEUM/RETROPERITONEUM AND MESENTERY: Redemonstration of ill-defined soft tissue within the lower central mesentery measuring approximately 2.9 x 2.7 cm (505:25), with associated tethering to loops of adjacent small bowel.     LYMPH NODES: No enlarged lymph nodes.     VESSELS: The aorta is normal in caliber.      BONES AND SOFT TISSUES: Postsurgical changes of the right anterior abdominal wall. Masslike thickening of the right inferior rectus abdominis measuring approximately 3.0 x 1.9 x 3.8 cm (505:64, 507:64). Additional smaller hyperattenuating foci along the lateral margin of the right rectus (505:42, 46). Tiny fat-containing umbilical hernia.             Sequelae of proctocolectomy with ileoanal anastomosis with redemonstrated moderate dilatation of the proximal aspect of the ileoanal anastomosis, which appears less prominent compared to the prior study.     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     Redemonstrated ill-defined soft tissue mass within the lower central mesentery with associated tethering to adjacent small bowel loops, compatible with known desmoid tumor.     Distended urinary bladder, which is otherwise unremarkable in appearance. Correlate clinically for signs of urinary retention.    CT Lower Extremity Bilateral W Contrast  Result Date: 11/11/2022  EXAM: CT LOWER EXTREMITY BILATERAL W CONTRAST DATE: 11/11/2022 9:41 AM ACCESSION: [id number] UN DICTATED: 11/11/2022 9:49 AM INTERPRETATION LOCATION: [facility]     CLINICAL INDICATION: 22 years old Female with Severe low back and hip pain of unclear etiology, recent bloodstream infection      COMPARISON: None.     TECHNIQUE: An axially-acquired helical CT scan of the bilateral proximal thighs was obtained after administration of IV contrast. Contiguous transverse, coronal and sagittal images were reconstructed at 3-mm increments.     FINDINGS: Small fluid attenuation collections along the proximal right thigh subcutaneous tissues. For reference, proximal anterior lateral thigh measuring 1.5 x 1.0 cm (provide 2:187) and lateral proximal to mid right thigh, measuring 1.3 x 1.3 cm (502:206). No significant adjacent fat stranding.     No acute fracture. Normal alignment of the bilateral hips. No asymmetric widening of the sacroiliac joints. Joint spaces are preserved. No CT evidence of joint effusion.     Please see separately dictated pelvis CT regarding findings in the partially imaged pelvis.             No significant bilateral hip joint effusions or sacroiliac joint effusions. No osseous destruction to suggest any CT evidence of infection involving bilateral hip joints or sacroiliac joints.     Small localized well-circumscribed fluid collections along the anterolateral proximal right thigh as detailed in the body of the report without significant adjacent fat stranding. These findings are nonspecific, possibly reflecting nonspecific subcutaneous cyst, differential includes, hematoma, versus less likely seroma. Superadded infection is difficult to exclude. No fluid collections suggestive of abscesses.

## 2022-11-19 NOTE — Unmapped (Signed)
Facility Information: [facility]   Facility ID: [id number]  Facility Medicare Provider Number: [id number]        Physical Medicine and Rehabilitation  Rehab Pre-Admit Screening  Date: 11/19/2022   Time: 2:30 PM     Patient Information:    Patient Name:  [name] [name] Medical Record Number: [id number]   Address:  [address] [place] [address] Sex: Female   Date of Birth: 11-Jul-2000 Age: 22 y.o.   Room/Bed:  8301/8301-01    _____________________________________________________________________________    Attending Physician's Review and Admission Determination   Medical Necessity:  The patient requires acute inpatient rehabilitation to maximize functional independence and requires daily physician visits for monitoring/management of  ongoing diffuse pain and muscle stiffness/dystonia on opioid pain regimen including suboxone, nutritional needs in setting of ileus and poor po intake requiring NG-tube for tube feeds that will need to be weaned, mood, electrolytes, bowel/bladder, vital signs, anticoagulation, neurological exam, medications, skin, spasticity, and pain control. This patient's rehabilitation goals and medical complexity could not adequately be managed in a less intensive setting.  Potential risks for clinical complications include falls, adverse medication reactions, DVT/PE, pressure ulcers, aspiration, urinary tract infection, dehydration/malnutrition, worsening of underlying disease, and bleeding.    Medical Prognosis: Good for continued progress and participation with therapy.    Anticipated Interdisciplinary Rehabilitation Interventions: Activity tolerance: Patient is expected to tolerate minimum of 3 hrs of therapy daily @ 5x/week. Physical therapy to work on mobility. Occupational therapy to work on self care. Recreational therapy for community reintegration and relaxation training. Neuropsych. Prosthetics & Orthotics for bracing as appropriate. Rehab Nursing to work on medication administration, patient/family education, skin care, fall prevention, bowel and bladder management, vital signs, and feeding/nutrition. Weekly interdisciplinary team conference to assess progress and plan of care changes.    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 [name] [name] was admitted to [facility] on 09/25/2022 for ileus, [name], and diffuse pain due to muscle spasms of uncertain etiology. [name] [name] has participated in acute inpatient physical and occupational therapies to improve functional mobility, activity tolerance, functional strength, balance, and endurance in order to facilitate safe performance of ADLs and daily routines. She is stable and awaiting rehab placement. [name] [name] has been referred to [facility] for continued acute medical management, provision of intensive inpatient therapies, and patient/family training to facilitate safe performance of ADLs and mobility, prior to discharge home.    Arneta Cliche, DO 11/19/2022 2:46 PM              _____________________________________________________________________________    Advanced Directives:                        Coverage Information:  Authorization number:    Authorization Code:  Activation code not generated  Current My [place] Chart Status: Active  Payor: BCBS / Plan: BCBS BLUE OPTIONS/PPO/ADV ([place] ONLY) / Product Type: *No Product type* / Physician/Referral Information:  Referring Facility: [facility]       Referring Case  Manager: Gustavus Bryant      Patient / Family / Caregiver Orientation: Pt and Family in agreement with [facility].      Prior Living Situation:  Living Environment: House    Lives With: Mother; Father    Home Living: Multi-level home; Bed/bath upstairs; 1/2 bath on main level; Stairs to alternate level with rails; Stairs to enter without rails; Stairs to alternate level without rails; Handicapped height toilet; Tub/shower unit; Able to Live on main level with bedroom/bathroom       Rail placement (inside): Rail on right side    Number of Stairs to Alternate level (inside): 14      Prior Level of Function:  Prior Function Comments: Patient reports independence without AD PTA. 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 Currently living with parents. History of syncope, denies other falls.      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 [name] [name] was admitted to [facility] on 09/25/2022 for ileus, [name], and diffuse pain due to muscle spasms of uncertain etiology. [name] [name] has participated in acute inpatient physical and occupational therapies to improve functional mobility, activity tolerance, functional strength, balance, and endurance in order to facilitate safe performance of ADLs and daily routines. She is stable and awaiting rehab placement. [name] [name] has been referred to [facility] for continued acute medical management, provision of intensive inpatient therapies, and patient/family training to facilitate safe performance of ADLs and mobility, prior to discharge home.      Impairment Group: (Debility) 16 Debility (Non-Cardiac/Non-Pulmonary)      Date of Onset: 09/25/22       Patient Active Problem List    Diagnosis Date Noted    Other acute postprocedural pain 09/25/2022    High risk medication use 09/17/2022    Urinary retention 08/07/2022    Anemia 06/13/2022    On total parenteral nutrition (TPN) 06/13/2022    Hypersensitivity reaction 04/08/2022    Ileus (CMS-HCC) 03/16/2022    Small intestinal bacterial overgrowth (SIBO) 03/14/2022    MDD (major depressive disorder) 03/13/2022    GI bleeding 02/12/2022    Generalized anxiety disorder with panic attacks 12/09/2021    Intractable abdominal pain 12/05/2021    Lower abdominal pain 08/27/2021    Winged scapula of right side 07/16/2021    Acute deep vein thrombosis (DVT) of axillary vein of right upper extremity (CMS-HCC) 03/20/2021    Severe protein-calorie malnutrition (CMS-HCC) 03/20/2021    Physical deconditioning 03/15/2021    History of colectomy 03/15/2021    Hyperglycemia 02/28/2021    Hypoglycemia 02/28/2021    SBO (small bowel obstruction) (CMS-HCC) 02/16/2021    Dehydration 10/14/2020    Iron deficiency anemia due to chronic blood loss 11/10/2019    Neoplasm related pain 08/04/2019    Syncope 05/14/2019    Chemotherapy-induced nausea 05/14/2019    Thyroid cyst 10/05/2018    [name] 08/25/2012    Intestinal polyps 08/25/2012    [name] 08/25/2012    [name] of skin 08/25/2012    History of colonic polyps 01/09/2012    Other benign neoplasm of connective and other soft tissue of upper limb, including shoulder 02/26/2011    Benign neoplasm of skin of trunk 01/17/2004       Medical / Functional Conditions Requiring Inpatient  Rehab: Patient requires monitoring of labwork, electrolytes, blood pressure and monitoring/management of medical diagnosis. Medical management/administration of opioid analgesics, anticonvulsants, antidepressants, muscle relaxants, antiemetics and other prescribed/necessary medications.  Monitoring/management of pain, cardiopulmonary status, renal status, gastrointestinal status, neurological status, infection, bleeding, bowel and bladder, DVT, and skin breakdown.      Risk for Medical / Clinical Complication: Patient at increased risk for complications of cardiopulmonary insufficiency, renal insufficiency, hypertensive crisis, gastrointestinal insufficiency, neurological decline, seizures, fluid volume overload, aspiration, bleeding, infection, uncontrolled pain, bowel/bladder retention, falls, and skin breakdown.      Special Rehabilitation Needs:  IV Lines / Tubes / Drains: CVC triple lumen, NGT       Hearing - Right Ear: Functional    Hearing - Left Ear: Functional       Cultural Requests During Hospitalization: Pt at increased risk of anxiety/depression related to recent medical status change       Nutrition Type: General; Tube feeding    Tube Feeding: Osmolite (Standard 1.5 Cal)    Tube Feeding Frequency: Intermittent    Weight Bearing Status: Non-applicable    Requires modified schedule: Pt may require rest breaks between therapy sessions      Precautions: Falls precautions; Aspiration precautions       Required Braces or Orthoses: Non-applicable      Lines/Drains/Airways:       CVC Triple Lumen 10/22/22 Non-tunneled Right Internal jugular (Active)   Site Assessment Clean;Intact;Dry 11/19/22 1045   Line Interventions Connections checked and tightened 11/19/22 1045   Lumen 1, Proximal Status / Patency Blood Return - Brisk;Saline locked 11/19/22 1045   Lumen 1, Proximal Flush Status Flushed-Push Pause/Turbulent 11/19/22 1045   Lumen 2, Medial Status / Patency Blood Return - Brisk;Saline locked 11/19/22 1045   Lumen 2, Medial Flush Status Flushed-Push Pause/Turbulent 11/19/22 1045   Lumen 3, Distal Status / Patency Blood Return - Brisk;Saline locked 11/19/22 1045   Lumen 3, Distal Flush Status Flushed-Push Pause/Turbulent 11/19/22 1045   Exposed Catheter Length (cm) 0 cm 10/24/22 0900   IV Tubing and Needleless Injector Cap Change Due 11/22/22 11/19/22 1045 Dressing Type CHG disk;Occlusive;Transparent 11/19/22 1045   Dressing Status      Clean;Dry;Intact/not removed 11/19/22 1045   Dressing Intervention No intervention needed 11/19/22 1045   Contraindicated due to: Dressing Intact surrounding insertion site 11/19/22 1045   Dressing Change Due 11/22/22 11/19/22 1045   Line Necessity Reviewed? Y 11/19/22 1045   Line Necessity Indications Yes - Inability to maintain peripheral access 11/19/22 1045   Line Necessity Reviewed With H pine 11/19/22 1045   Number of days: 28              Past Medical History:  Past Medical History:   Diagnosis Date    Abdominal pain     Acid reflux     occas    Anesthesia complication     itching, shaking, coldness; last few surgeries have gone much better    Cancer (CMS-HCC)     Cataract of right eye     COVID-19 virus infection 01/2019    Cyst of thyroid determined by ultrasound     monitoring    [name]     2 right forearm, 1 left thigh, 1 right scapula, 1 under left clavicle; multiple    Difficult intravenous access     [name] (familial adenomatous polyposis)     [name]     Gastric polyps     History of chemotherapy  last treatment approx 05/2019    History of colon polyps     History of COVID-19 01/2019    Iron deficiency anemia due to chronic blood loss     received iron infusion 11-2019    PONV (postoperative nausea and vomiting)     Rectal bleeding     Syncopal episodes     especially if becoming dehydrated     Past Surgical History:  Past Surgical History:   Procedure Laterality Date    COLON SURGERY      cyroablation      cystis removal      desmoid removal      PR CLOSE ENTEROSTOMY,RESEC+ANAST N/A 10/09/2020    Procedure: ILEOSTOMY TAKEDOWN;  Surgeon: Mickle Asper, MD;  Location: OR [place];  Service: General Surgery    PR COLONOSCOPY W/BIOPSY SINGLE/MULTIPLE N/A 10/27/2012    Procedure: COLONOSCOPY, FLEXIBLE, PROXIMAL TO SPLENIC FLEXURE; WITH BIOPSY, SINGLE OR MULTIPLE;  Surgeon: Shirlyn Goltz Mir, MD;  Location: PEDS PROCEDURE ROOM [facility];  Service: Gastroenterology    PR COLONOSCOPY W/BIOPSY SINGLE/MULTIPLE N/A 09/14/2013    Procedure: COLONOSCOPY, FLEXIBLE, PROXIMAL TO SPLENIC FLEXURE; WITH BIOPSY, SINGLE OR MULTIPLE;  Surgeon: Shirlyn Goltz Mir, MD;  Location: PEDS PROCEDURE ROOM [place];  Service: Gastroenterology    PR COLONOSCOPY W/BIOPSY SINGLE/MULTIPLE N/A 11/08/2014    Procedure: COLONOSCOPY, FLEXIBLE, PROXIMAL TO SPLENIC FLEXURE; WITH BIOPSY, SINGLE OR MULTIPLE;  Surgeon: Arnold Long Mir, MD;  Location: PEDS PROCEDURE ROOM [facility];  Service: Gastroenterology    PR COLONOSCOPY W/BIOPSY SINGLE/MULTIPLE N/A 12/26/2015    Procedure: COLONOSCOPY, FLEXIBLE, PROXIMAL TO SPLENIC FLEXURE; WITH BIOPSY, SINGLE OR MULTIPLE;  Surgeon: Arnold Long Mir, MD;  Location: PEDS PROCEDURE ROOM [facility];  Service: Gastroenterology    PR COLONOSCOPY W/BIOPSY SINGLE/MULTIPLE N/A 09/02/2017    Procedure: COLONOSCOPY, FLEXIBLE, PROXIMAL TO SPLENIC FLEXURE; WITH BIOPSY, SINGLE OR MULTIPLE;  Surgeon: Arnold Long Mir, MD;  Location: PEDS PROCEDURE ROOM [place];  Service: Gastroenterology    PR COLSC FLX W/REMOVAL LESION BY HOT BX FORCEPS N/A 08/27/2016    Procedure: COLONOSCOPY, FLEXIBLE, PROXIMAL TO SPLENIC FLEXURE; W/REMOVAL TUMOR/POLYP/OTHER LESION, HOT BX FORCEP/CAUTE;  Surgeon: Arnold Long Mir, MD;  Location: PEDS PROCEDURE ROOM [facility];  Service: Gastroenterology    PR COLSC FLX W/RMVL OF TUMOR POLYP LESION SNARE TQ N/A 02/25/2019    Procedure: COLONOSCOPY FLEX; W/REMOV TUMOR/LES BY SNARE;  Surgeon: Helyn Numbers, MD;  Location: [facility] PROCEDURES [place] [facility];  Service: Gastroenterology    PR COLSC FLX W/RMVL OF TUMOR POLYP LESION SNARE TQ N/A 03/13/2020    Procedure: COLONOSCOPY FLEX; W/REMOV TUMOR/LES BY SNARE;  Surgeon: Helyn Numbers, MD;  Location: [facility]ED[facility] [place] [facility];  Service: Gastroenterology    PR EXC SKIN BENIG 2.1-3 CM TRUNK,ARM,LEG Right 02/25/2017    Procedure: EXCISION, BENIGN LESION INCLUDE MARGINS, EXCEPT SKIN TAG, LEGS; EXCISED DIAMETER 2.1 TO 3.0 CM;  Surgeon: Clarene Duke, MD;  Location: CHILDRENS OR [facility];  Service: Plastics    PR EXC SKIN BENIG 3.1-4 CM TRUNK,ARM,LEG Right 02/25/2017    Procedure: EXCISION, BENIGN LESION INCLUDE MARGINS, EXCEPT SKIN TAG, ARMS; EXCISED DIAMETER 3.1 TO 4.0 CM;  Surgeon: Clarene Duke, MD;  Location: CHILDRENS OR [facility];  Service: Plastics    PR EXC SKIN BENIG >4 CM FACE,FACIAL Right 02/25/2017    Procedure: EXCISION, OTHER BENIGN LES INCLUD MARGIN, FACE/EARS/EYELIDS/NOSE/LIPS/MUCOUS MEMBRANE; EXCISED DIAM >4.0 CM;  Surgeon: Clarene Duke, MD;  Location: CHILDRENS OR [facility]  Service: Plastics    PR EXC TUMOR SOFT TISSUE LEG/ANKLE SUBQ 3+CM  Right 08/05/2019    Procedure: EXCISION, TUMOR, SOFT TISSUE OF LEG OR ANKLE AREA, SUBCUTANEOUS; 3 CM OR GREATER;  Surgeon: Arsenio Katz, MD;  Location: MAIN OR [place];  Service: Plastics    PR EXC TUMOR SOFT TISSUE LEG/ANKLE SUBQ <3CM Right 08/05/2019    Procedure: EXCISION, TUMOR, SOFT TISSUE OF LEG OR ANKLE AREA, SUBCUTANEOUS; LESS THAN 3 CM;  Surgeon: Arsenio Katz, MD;  Location: MAIN OR [facility];  Service: Plastics    PR LAP, SURG PROCTECTOMY W J-POUCH N/A 08/10/2020    Procedure: ROBOTIC ASSISTED LAPAROSCOPIC PROCTOCOLECTOMY, ILEAL J POUCH, WITH OSTOMY;  Surgeon: Mickle Asper, MD;  Location: OR [place];  Service: General Surgery    PR NDSC EVAL INTSTINAL POUCH DX W/COLLJ SPEC SPX N/A 01/23/2021    Procedure: ENDO EVAL SM INTEST POUCH; DX;  Surgeon: Modena Nunnery, MD;  Location: [facility] PROCEDURES [facility];  Service: Gastroenterology    PR NDSC EVAL INTSTINAL POUCH DX W/COLLJ SPEC SPX N/A 08/27/2021    Procedure: ENDO EVAL SM INTEST POUCH; DX;  Surgeon: Hunt Oris, MD;  Location: [facility]URES [facility];  Service: Gastroenterology    PR NDSC EVAL INTSTINAL POUCH DX W/COLLJ SPEC SPX N/A 12/09/2021    Procedure: ENDO EVAL SM INTEST POUCH; DX;  Surgeon: Vidal Schwalbe, MD;  [facility]: [facility];  Service: Gastroenterology    PR NDSC EVAL INTSTINAL POUCH DX W/COLLJ [facility] SPX Left 04/09/2022    Procedure: ENDO EVAL SM INTEST POUCH; DX;  Surgeon: Modena Nunnery, MD;  Location: [facility] [place] [facility];  Service: Gastroenterology    PR NDSC EVAL INTSTINAL POUCH DX W/COLLJ SPEC SPX N/A 08/05/2022    Procedure: ENDO EVAL SM INTEST POUCH; DX;  Surgeon: Modena Nunnery, MD;  Location: GI PROCEDURES [facility];  Service: Gastroenterology    PR NDSC EVAL INTSTINAL POUCH W/BX SINGLE/MULTIPLE N/A 01/20/2022    Procedure: ENDOSCOPIC EVAL OF SMALL INTESTINAL POUCH; DIAGNOSTIC, No biopsies;  Surgeon: Andrey Farmer, MD;  Location: GI PROCEDURES [facility];  Service: Gastroenterology    PR NDSC EVAL INTSTINAL POUCH W/BX SINGLE/MULTIPLE N/A 02/13/2022    Procedure: ENDOSCOPIC EVAL OF SMALL INTESTINAL POUCH; DIAGNOSTIC, WITH BIOPSY;  Surgeon: Bronson Curb, MD;  Location: GI PROCEDURES [facility];  Service: Gastroenterology    PR UNLISTED PROCEDURE SMALL INTESTINE  01/23/2021    Procedure: UNLISTED PROCEDURE, SMALL INTESTINE;  Surgeon: Modena Nunnery, MD;  Location: [facility] [place] [facility];  Service: Gastroenterology    PR UNLISTED PROCEDURE SMALL INTESTINE  02/13/2022    Procedure: UNLISTED PROCEDURE, SMALL INTESTINE;  Surgeon: Bronson Curb, MD;  Location: GI PROCEDURES [facility];  Service: Gastroenterology    PR UPPER GI ENDOSCOPY,BIOPSY N/A 10/27/2012    Procedure: UGI ENDOSCOPY; WITH BIOPSY, SINGLE OR MULTIPLE;  Surgeon: Shirlyn Goltz Mir, MD;  Location: PEDS PROCEDURE ROOM [facility];  Service: Gastroenterology    PR UPPER GI ENDOSCOPY,BIOPSY N/A 09/14/2013    Procedure: UGI ENDOSCOPY; WITH BIOPSY, SINGLE OR MULTIPLE;  Surgeon: Shirlyn Goltz Mir, MD;  Location: PEDS PROCEDURE ROOM [facility];  Service: Gastroenterology    PR UPPER GI ENDOSCOPY,BIOPSY N/A 11/08/2014    Procedure: UGI ENDOSCOPY; WITH BIOPSY, SINGLE OR MULTIPLE;  Surgeon: Arnold Long Mir, MD; Location: PEDS PROCEDURE ROOM [facility];  Service: Gastroenterology    PR UPPER GI ENDOSCOPY,BIOPSY N/A 12/26/2015    Procedure: UGI ENDOSCOPY; WITH BIOPSY, SINGLE OR MULTIPLE;  Surgeon: Arnold Long Mir, MD;  Location: PEDS PROCEDURE ROOM [facility];  Service: Gastroenterology  PR UPPER GI ENDOSCOPY,BIOPSY N/A 08/27/2016    Procedure: UGI ENDOSCOPY; WITH BIOPSY, SINGLE OR MULTIPLE;  Surgeon: Arnold Long Mir, MD;  Location: PEDS PROCEDURE ROOM [facility];  Service: Gastroenterology    PR UPPER GI ENDOSCOPY,BIOPSY N/A 09/02/2017    Procedure: UGI ENDOSCOPY; WITH BIOPSY, SINGLE OR MULTIPLE;  Surgeon: Arnold Long Mir, MD;  Location: PEDS PROCEDURE ROOM [facility];  Service: Gastroenterology    PR UPPER GI ENDOSCOPY,BIOPSY N/A 03/13/2020    Procedure: UGI ENDOSCOPY; WITH BIOPSY, SINGLE OR MULTIPLE;  Surgeon: Helyn Numbers, MD;  Location: [facility] PROCEDURES [place] [facility];  Service: Gastroenterology    PR UPPER GI ENDOSCOPY,BIOPSY N/A 09/05/2021    Procedure: UGI ENDOSCOPY; WITH BIOPSY, SINGLE OR MULTIPLE;  Surgeon: Wendall Papa, MD;  Location: GI PROC[facility]URES [facility];  Service: Gastroenterology    PR UPPER GI ENDOSCOPY,DIAGNOSIS N/A 01/20/2022    Procedure: UGI ENDO, INCLUDE ESOPHAGUS, STOMACH, & DUODENUM &/OR JEJUNUM; DX W/WO COLLECTION SPECIMN, BY BRUSH OR WASH;  Surgeon: Andrey Farmer, MD;  Location: GI PROCEDURES [facility];  Service: Gastroenterology    TUMOR REMOVAL      multiple-head, neck, back, hand, right flank, multiple     Family History:  Family History   Problem Relation Age of Onset    No Known Problems Mother     No Known Problems Father     No Known Problems Sister     No Known Problems Brother     Stroke Maternal Grandmother     Other Maternal Grandmother         benign lesions of liver and pancreas, further details unknown    Cancer Maternal Grandmother     Diabetes Maternal Grandmother     Hypertension Maternal Grandmother     Thyroid disease Maternal Grandmother     Arthritis Maternal Grandfather Asthma Maternal Grandfather     COPD Paternal Grandmother         Deceased    Miscarriages / Stillbirths Paternal Grandmother     Alcohol abuse Paternal Grandfather         Deceased    No Known Problems Maternal Aunt     No Known Problems Maternal Uncle     No Known Problems Paternal Aunt     No Known Problems Paternal Uncle     Anesthesia problems Neg Hx     Broken bones Neg Hx     Cancer Neg Hx     Clotting disorder Neg Hx     Collagen disease Neg Hx     Diabetes Neg Hx     Dislocations Neg Hx     Fibromyalgia Neg Hx     Gout Neg Hx     Hemophilia Neg Hx     Osteoporosis Neg Hx     Rheumatologic disease Neg Hx     Scoliosis Neg Hx     Severe sprains Neg Hx     Sickle cell anemia Neg Hx     Spinal Compression Fracture Neg Hx     Melanoma Neg Hx     Basal cell carcinoma Neg Hx     Squamous cell carcinoma Neg Hx      Social History:  Social History     Socioeconomic History    Marital status: Single     Spouse name: None    Number of children: None    Years of education: None    Highest education level: None   Tobacco Use    Smoking status: Never  Passive exposure: Past    Smokeless tobacco: Never   Vaping Use    Vaping status: Never Used   Substance and Sexual Activity    Alcohol use: Never    Drug use: Never    Sexual activity: Never   Other Topics Concern    Do you use sunscreen? Yes    Tanning bed use? No    Are you easily burned? No    Excessive sun exposure? No    Blistering sunburns? 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     Social Determinants of Health     Financial Resource Strain: Low Risk  (09/26/2022)    Overall Financial Resource Strain (CARDIA)     Difficulty of Paying Living Expenses: Not very hard   Food Insecurity: No Food Insecurity (09/26/2022)    Hunger Vital Sign     Worried About Running Out of Food in the Last Year: Never true     Ran Out of Food in the Last Year: Never true   Transportation Needs: No Transportation Needs (09/26/2022)    PRAPARE - Therapist, art (Medical): No     Lack of Transportation (Non-Medical): No     Current Facility-Administered Medications Ordered in Epic   Medication Dose Route Frequency Provider Last Rate Last Admin    acetaminophen (TYLENOL) tablet 650 mg  650 mg Enteral tube: gastric Q6H PRN Anell Barr, MD   650 mg at 10/22/22 2123    aluminum-magnesium hydroxide-simethicone (MAALOX PLUS) 200-200-20 mg/5 mL suspension 60 mL  60 mL Oral Q6H PRN Anell Barr, MD        baclofen (LIORESAL) tablet 10 mg  10 mg Oral TID PRN Earna Coder, MD   10 mg at 11/15/22 1702    bisacodyl (DULCOLAX) suppository 10 mg  10 mg Rectal BID PRN Anell Barr, MD        buprenorphine-naloxone (SUBOXONE) 2-0.5 mg SL film 1 mg of buprenorphine  0.5 Film Sublingual TID Earna Coder, MD   1 mg of buprenorphine at 11/19/22 1356    butalbital-acetaminophen-caffeine (ESGIC) per tablet 1 tablet  1 tablet Oral BID PRN Erlinda Hong, MD        carboxymethylcellulose sodium (REFRESH CELLUVISC) 1 % ophthalmic gel 1 drop  1 drop Both Eyes QID Dacillo-Curso, Amy, ACNP   1 drop at 11/18/22 2242    carboxymethylcellulose sodium (REFRESH CELLUVISC) 1 % ophthalmic gel 2 drop  2 drop Both Eyes QID PRN Dacillo-Curso, Amy, ACNP   2 drop at 11/01/22 1655    CHEMO CLARIFICATION ORDER   Other Continuous PRN Horald Pollen, MD PhD        CHEMO CLARIFICATION ORDER   Other Continuous PRN Horald Pollen, MD PhD        diazePAM (VALIUM) tablet 2.5 mg  2.5 mg Oral Daily Kateri Plummer, MD   2.5 mg at 11/19/22 1610    And    diazePAM (VALIUM) tablet 2.5 mg  2.5 mg Oral Daily Kateri Plummer, MD   2.5 mg at 11/19/22 1357    diazePAM (VALIUM) tablet 2.5 mg  2.5 mg Oral Nightly PRN Kateri Plummer, MD        diazePAM (VALIUM) tablet 5 mg  5 mg Oral Nightly Kateri Plummer, MD   5 mg at 11/18/22 2242  diclofenac sodium (VOLTAREN) 1 % gel 2 g  2 g Topical QID Dacillo-Curso, Amy, ACNP   2 g at 11/19/22 1200    famotidine (PEPCID) tablet 20 mg  20 mg Oral Nightly Anell Barr, MD   20 mg at 11/18/22 2242    gabapentin (NEURONTIN) capsule 100 mg  100 mg Oral TID Dacillo-Curso, Amy, ACNP   100 mg at 11/19/22 1406    guaiFENesin (ROBITUSSIN) oral syrup  200 mg Oral Q4H PRN Anell Barr, MD   200 mg at 10/17/22 2344    hydrocortisone 2.5 % cream   Topical BID PRN Dacillo-Curso, Amy, ACNP   Given at 11/11/22 2223    HYDROmorphone (PF) (DILAUDID) injection 0.5 mg  0.5 mg Intravenous Once Erlinda Hong, MD        HYDROmorphone (PF) (DILAUDID) injection 0.5 mg  0.5 mg Intravenous Q6H PRN Erlinda Hong, MD   0.5 mg at 11/19/22 1358    IP OKAY TO TREAT   Other Continuous PRN Horald Pollen, MD PhD        lidocaine (ASPERCREME) 4 % 2 patch  2 patch Transdermal Daily Kateri Plummer, MD   2 patch at 11/19/22 0936    mirtazapine (REMERON) tablet 15 mg  15 mg Enteral tube: gastric Nightly Anell Barr, MD   15 mg at 11/18/22 2255    mucositis mixture (with lidocaine)  10 mL Mouth QID PRN Dacillo-Curso, Amy, ACNP   10 mL at 10/26/22 2146    multivitamin with folic acid 400 mcg tablet 1 tablet  1 tablet Oral Daily Anell Barr, MD   1 tablet at 11/19/22 0935    naloxegol (MOVANTIK) 12.5 mg tablet 12.5 mg  12.5 mg Oral Daily Anell Barr, MD   12.5 mg at 11/19/22 0865    Nirogacestat (Ogsiveo) tablet 100 mg Patient Supplied  100 mg Oral BID Horald Pollen, MD PhD   100 mg at 11/19/22 0935    [START ON 11/25/2022] Nirogacestat (Ogsiveo) tablet 150 mg Patient Supplied  150 mg Oral BID Horald Pollen, MD PhD        ondansetron [facility]) injection 4 mg  4 mg Intravenous Q12H PRN Dacillo-Curso, Amy, ACNP   4 mg at 11/19/22 7846    oxyCODONE (ROXICODONE) immediate release tablet 10 mg  10 mg Oral Q4H PRN Hemsey, Huntley Estelle, MD        Or    oxyCODONE (ROXICODONE) immediate release tablet 15 mg  15 mg Oral Q4H PRN Earna Coder, MD   15 mg at 11/18/22 2255    pantoprazole (Protonix) EC tablet 40 mg  40 mg Oral BID Anell Barr, MD   40 mg at 11/19/22 0937    phenol (CHLORASEPTIC) 1.4 % spray 2 spray  2 spray Mucous Membrane Q2H PRN Dacillo-Curso, Amy, ACNP   2 spray at 10/03/22 0138    polyethylene glycol (MIRALAX) packet 17 g  17 g Oral BID PRN Anell Barr, MD   17 g at 11/10/22 2132    promethazine (PHENERGAN) 6.5 mg in sodium chloride (NS) 0.9 % 50 mL IVPB  6.5 mg Intravenous Q6H PRN Dacillo-Curso, Amy, ACNP   Stopped at 11/19/22 1430    pseudoephedrine (SUDAFED) tablet 30 mg  30 mg Oral Daily PRN Anell Barr, MD        senna Mancel Parsons) tablet 1 tablet  1 tablet Oral BID PRN Anell Barr, MD   1 tablet at 11/16/22  2211    sodium chloride (NS) 0.9 % infusion  10 mL/hr Intravenous Continuous Dacillo-Curso, Amy, ACNP 10 mL/hr at 11/09/22 0314 10 mL/hr at 11/09/22 0314    zinc oxide-cod liver oil (DESITIN 40%) Paste   Topical Daily PRN Dacillo-Curso, Amy, ACNP   Given at 11/11/22 2223       Medications Prior to Admission   Medication Sig Dispense Refill Last Dose    [EXPIRED] buprenorphine-naloxone (SUBOXONE) 2-0.5 mg sublingual film Place 0.25 Film (0.5 mg of buprenorphine total) under the tongue three (3) times a day. Fill on or after: 09/05/22 22 Film 0 09/25/2022    LORazepam (ATIVAN) 0.5 MG tablet Take 1 tablet (0.5 mg total) by mouth Three (3) times a day as needed for anxiety. 20 tablet 0 Unknown    promethazine (PHENERGAN) 12.5 MG tablet Take 1 tablet (12.5 mg total) by mouth every six (6) hours as needed. 20 tablet 0 Unknown    acetaminophen (TYLENOL) 500 MG tablet Take 2 tablets (1,000 mg total) by mouth every eight (8) hours.       baclofen (LIORESAL) 5 mg Tab tablet Take 1 tablet (5 mg total) by mouth Three (3) times a day as needed for muscle spasms. 30 tablet 0     cefdinir (OMNICEF) 300 MG capsule Take 1 capsule (300mg ) twice a day on Mon, Wed, Friday       cholecalciferol, vitamin D3 25 mcg, 1,000 units,, 1,000 unit (25 mcg) tablet Take 1 tablet (25 mcg total) by mouth daily. 100 tablet 3     clobetasoL (TEMOVATE) 0.05 % ointment Apply topically two (2) times a day. To stubborn/thick rashes on hands/feet ears until clear/smooth. Restart as needed 60 g 3     diclofenac sodium (VOLTAREN) 1 % gel Apply 2 g topically four (4) times a day as needed for pain.       famotidine (PEPCID) 20 MG tablet Take 1 tablet (20 mg total) by mouth nightly. 90 tablet 3     hydrocortisone (ANUSOL-HC) 2.5 % rectal cream Insert into the rectum two (2) times a day as needed.       lidocaine 4 % patch Place 2 patches on the skin daily for 7 days. Apply to affected area for 12 hours, then remove for 12 hours. 15 patch 0     LORazepam (ATIVAN) 0.5 MG tablet Take 1 tablet (0.5 mg total) by mouth Three (3) times a day as needed for anxiety. 270 tablet 2     mirtazapine (REMERON) 15 MG tablet Take  1 tablet by mouth (15mg ) at night with 7.5mg  tablet to make 22.5mg  nightly 30 tablet 0     naloxone (NARCAN) 4 mg nasal spray One spray in either nostril once for known/suspected opioid overdose. May repeat every 2-3 minutes in alternating nostril til EMS arrives 2 each 0     nirogacestat (OGSIVEO) 50 mg tablet Take 3 tablets (150 mg total) by mouth two (2) times a day. Swallow tablets whole; do not break, crush, or chew. 180 tablet 0     [EXPIRED] ondansetron (ZOFRAN-ODT) 4 MG disintegrating tablet Dissolve 1 tablet (4 mg total) by mouth every six (6) hours as needed. 20 tablet 0     oxyCODONE (ROXICODONE) 10 mg immediate release tablet Take 1 tablet (10 mg total) by mouth Three (3) times a day as needed for pain. Fill on or after: 09/08/22 90 tablet 0     pimecrolimus (ELIDEL) 1 % cream Apply topically two (2) times a day  as needed. To face as needed for rash       triamcinolone (KENALOG) 0.1 % cream Apply topically two (2) times a day as needed. Apply to rash as needed       zinc oxide 10 % Crea Apply 1 application. topically daily as needed.             Vitals:    11/18/22 1900 11/19/22 0005 11/19/22 0027 11/19/22 0745   BP:  118/71  112/62   Pulse: 91 97 95 94   Resp:  17  17   Temp:  36.5 ??C (97.7 ??F)  36.5 ??C (97.7 ??F)   TempSrc:  Oral  Oral   SpO2: 100% 100% 100%    Weight:       Height:           Vitals:    Height:  165.1 cm (5' 5), Weight: 61 kg (134 lb 8 oz)    Labs:      CBC -   Lab Results   Component Value Date    WBC 6.4 11/17/2022    RBC 3.74 (L) 11/17/2022    HGB 10.6 (L) 11/17/2022    HCT 30.8 (L) 11/17/2022    MCV 82.4 11/17/2022    MCH 28.4 11/17/2022    MCHC 34.5 11/17/2022    MPV 9.5 11/17/2022    PLT 183 11/17/2022     BMP -   Lab Results   Component Value Date    NA 143 11/17/2022    K 3.7 11/17/2022    CL 107 11/17/2022    CO2 28.0 11/17/2022    BUN 6 (L) 11/17/2022    CREATININE 0.44 (L) 11/17/2022    GFR UNABLE TO CALCULATE GFR.BASED ON PATIENT AGE 36/09/2011    GFRAAF >90 06/10/2019    GFRNAAF >90 06/10/2019    GLU 120 11/17/2022     CARD -   Lab Results   Component Value Date    CKTOTAL 19.0 (L) 11/04/2022    TROPONINI <3 09/26/2022     Coagulation -   Lab Results   Component Value Date    PT 11.8 07/21/2022    INR 1.06 07/21/2022    APTT 57.7 (H) 05/02/2022     ABGs- No results found for: PHART, PO2ART, PCO2ART, BEART, HCO3ART, O2SATART  LFT's -   Lab Results   Component Value Date    ALBUMIN 2.9 (L) 11/17/2022    ALT 26 11/17/2022    AST 23 11/17/2022    ALKPHOS 101 11/17/2022    BILITOT 0.2 (L) 11/17/2022    BILIDIR 0.10 10/29/2022    PROT 5.5 (L) 11/17/2022       Current Functional Status:  ADLs: Needs assistance with ADLs    ADLs - Needs Assistance: LB dressing; Bathing; Grooming; Toileting; UB dressing; Feeding    Feeding - Needs Assistance: Set Up Assist    Grooming - Needs Assistance: Performed seated; Supervision    Bathing - Needs Assistance: Mod assist    Toileting - Needs Assistance: Min assist    UB Dressing - Needs Assistance: Contact Guard assist    LB Dressing - Needs Assistance: Mod assist; Max assist       Mobility:   Bed Mobility comments: pt completed sup to sit Mod A for LE management, HOB elevated, able to manage trunk CGA but increased time needed due to pain/fatigue    Transfers: STS from EOB Min A w/ RW, cues for setup and anterior lean. Increased time  for completion. STS from EOC  x 4 bouts after sitting rest breaks w/ RW CGA, PT cued for anterior trunk lean and nose over toes    Skilled Treatment Performed: Pt able to ambulate x 40ft x 4 bouts w/ RW CGA, significantly diminished gait speed, decreased foot clearance and narrow BoS. Pt demo anterior trunk lean and reuqired cues from PT for posture correction. W/ increased fatigue pt demonstrated decreased stride length and increase in shuffle patterning. No overt LOB. Pt required seated rest breaks for tolerane and VS monitored closely. Chair follow.      Cognition/Swallow/Speech:  Patient's Vision Adequate to Safely Complete Daily Activities: Yes    Patient's Judgement Adequate to Safely Complete Daily Activities: Yes    Patient's Memory Adequate to Safely Complete Daily Activities: Yes    Patient Able to Express Needs/Desires: Yes    Patient has speech problem: No      DME Recommendations:  PT DME Recommendations: Defer to post acute    OT DME Recommendations: Defer to post acute      Willingness to Participate: Pt is willing and able to participate in 3 hours of daily therapy.       Rehab Goals and Plan    Expected level of improvement for safe discharge: Patient will discharge home as independent as possible with ADLs and functional mobility with least restrictive assistive device for household distances.     Patient / Family Goals: Patient will safely return home with family, modified independence in ADLs and assist as needed with ambulation for household distances.    Required treatments and services: Rehab nursing, Case management, Dietician/nutrition     Anticipated Interventions:    Physical Therapy: 60-120 min/day 5-7 days/wk  Occupational Therapy: 60-120 min/day 5-7 days/wk  Recreational therapy: 30 min/day 3 days/wk  Prosthetics and Orthotics: As Needed      Anticipated services upon discharge:     Outpatient therapy: PT, OT    Expected discharge destination: Home with Family    Discharge support: Patient has a caregiver available    Patient/family/caregiver orientation: Patient and family agreeable to inpatient rehab plan    Estimated Length of Stay: 12 days    Projected Admission Date: Wednesday, November 19, 2022     Reviewer's Signature, Date and Time:   Donna Christen MS, PT  [facility]  Inpatient Coordinator   Office [phone]   2:31 PM 11/19/2022

## 2022-11-19 NOTE — Unmapped (Incomplete)
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 [name] [name] was admitted to [facility] on 09/25/2022 for ileus, inability to tolerate PO now on supplemental tube feed, and diffuse pain due to muscle spasms of uncertain etiology. [name] [name] has participated in acute inpatient physical and occupational therapies to improve functional mobility, activity tolerance, functional strength, balance, and endurance in order to facilitate safe performance of ADLs and daily routines. She is stable and awaiting rehab placement. She is now admitted to [facility] for comprehensive interdisciplinary rehabilitation.      Rehab Impairment Group Code [facility]): (Debility) 16 Debility (Non-Cardiac/Non-Pulmonary)  Etiology: Diffuse pain due to muscle spasms of uncertain etiology and cutaneous desmoid tumors    PLAN:     This patient is admitted to the Physical Medicine and Rehabilitation - Inpatient - C service from 8am-5pm on weekdays for questions regarding this patient. After hours, weekends, and holidays please contact the 1st Call resident pager       REHAB:   - PT and OT to maximize functional status with mobility and ADLs as well as prevention of joint contracture.   - Neuropsych for higher level cognitive evaluation and coping.  - RT for community re-integration, education, and leisure support services.  - Nutrition consult for diet information/teaching.   - To be discussed in weekly Interdisciplinary Team Conference.     Increased Tonicity  Dystonia   Started post-procedurally per patient ~09/25/22. Occurs intermittently throughout the day with a heightened occurrence during mobilization. At rest patient bilateral ankles in plantarflexion with worsening heel cord tightening. Has tonicity most prominent to HF and KE that is not velocity dependent. Has not noted significant improvement with increase of baclofen to 15 mg TID in setting of infection as below. Differential wise, Neurology previously evaluated patient seemingly ruling out an acute dystonic reaction, cord compression, central process, nutritional deficiency, stiff person syndrome and [name] after normal EMG on 8/22. Continues to be well controlled with valium, small improvement with baclofen PRN.  - Continue Baclofen 10 mg TID prn  - Continued Valium 2.5/2.5/5 mg TID and 2.5 mg nightly PRN  - At risk for contractures, Recommend ordering bilateral hard PRAFO boots  - Recommend OOB to chair daily with assistance if appropriate     Headache  Currently managed with tylenol. She does have some posterior neck muscle tightness. Notes ketamine is helpful for headache pains. Describes photophobia and phonophobia with events. I discussed with patient our hope is to decrease risk of rebound headaches and limit medical issues which will interfere with therapy. An additional thought is patient's headaches could be cervicogenic. She does have loss of cervical lordosis on most recent MRI.   - Given patient with elevated heart rate, frequent headaches, and has limited therapy participation 2/2 tachycardia in setting of continuous headaches  - Consider starting propranolol 5 mg BID for headache prophylaxis.   - Prn tylenol +/- prn fiorcet   - Lidocaine cream PRN to area of pain for cervicogenic headaches     Pain  Has a chronic pain provider. Pain management consulted with recommendations as below. Patient will need to follow up with outpatient pain provider ([name]).  - Tylenol 650 mg q6h PRN  - IV Dilaudid 0.5 mg q6h PRN  - Oxyocodone  10-15 mg q4h PRN  - Suboxone 2-0.5 mg TID  - Continue Gabapentin 100 mg TID, can increase to 300mg  TID as tolerated  - Voltaren gel QID, lidocaine patches  - Continue patellar taping with therapy or obtain patellar brace to help with pain with ambulation     Multifactorial Ileus   Coffee Ground Emesis 2/2 NGT placement (resolved)  Contributors with history of narcotics, poor PO intake. Concern for malnutrition. Improvements to abdominal pain since weaning opioids. TPN has been discontinued with patient now tolerating tube feeds.  Working on decreasing tube feeds to tolerate p.o. intake  -Continue Tube feeds as guided by nutrition. May discharge home with corpak  -bowel regimen: Naloxegol daily  -NG tube in place  -Trialing food/meds PO prior to NG tube removal   -PO Protonix 40 mg BID      Syncope  Multifactorial. Primary team ddx includes: vasovagal vs adverse effect of medications vs deconditioning vs iron deficiency. Last echo 04/2022  - CTM     IDA  -has allergy to formula iron      [name] - Desmoid Tumors  Follows [facility] (Dr. [name]) is patient's outpatient oncologist.   -Continue nirogacestat 150 mg BID     Enterobacteriaceae group bacteremia (Resolved) - C albicans fungemia (Resolved)   Likely due to RIJ tunneled line placed 01/2022 now removed on 8/14.  8/13, 8/17 repeat blood cultures NGTD.  No evidence of vitreitis, retinitis 8/15 per Ophtho.  TTE 8/15 w possible vegetations on MV and TV or indwelling catheter; however, TEE 8/20 w/o vegetations. ID consulted with recommendations as below. Completed treatment with cephalosporin and fluconazole on 8/31.      Generalized anxiety disorder, depression, h/o panic attacks  Psych consulted with recommendations as below.   - Consider decreasing mirtazapine 15 mg nightly to 7.5 mg nightly due to somnolence  - Valium as above, If needed could give 2.5mg  PRN for acute anxiety.     Daily Checklist  - Diet: Regular diet + nighttime tube feeds  - DVT PPX: SQ enoxaparin   - GI PPX: Protonix  - Access: R IJ non tunneled CVC    DISPO: Patient to be discussed at weekly interdisciplinary team conference.   - EDD: TBD.   - Follow-up: PCP, PM&R, Chronic Pain    SUBJECTIVE and PROGRESS WITH FUNCTIONAL ACTIVITIES:     Interval Events:   NAEON. Slept well overnight. Eating well. Denies pain. No chest pain, SOB, abd pain. +BM. No urinary complaints. Therapy evaluations planned for today. ***    OBJECTIVE:     Vital signs (last 24 hours):  Temp:  [36.4 ??C (97.5 ??F)-36.5 ??C (97.7 ??F)] 36.4 ??C (97.5 ??F)  Heart Rate:  [90-97] 90  Resp:  [16-17] 16  BP: (107-118)/(62-78) 107/78  MAP (mmHg):  [79-87] 87  SpO2:  [97 %-100 %] 97 %    Intake/Output (last 3 shifts):  No intake/output data recorded.    Physical Exam:   ***GEN: NAD, resting comfortably in bed  EYES: sclera anicteric, conjunctiva clear   HEENT: MMM  NECK: trachea midline  CV: warm extremities, well perfused, no cyanosis  RESP:  NWOB on ***  ABD: Not distended  SKIN: no visible masses, lesions, rashes, ecchymoses.   *** MSK: no notable contractures or ext swelling  ***NEURO:Alert, MAE, speech fluid and coherent, responds appropriately to questions   PSYCH: mood euthymic, affect appropriate, thought process logical         Medications:  Scheduled   PRN  Labs/Studies: Reviewed.    Radiology Results: Reviewed    Quality Indicators                           ADLs  Admission Current   Eating      CARE Score -        CARE Score -     Oral Hygiene       CARE Score -         CARE Score -     Bladder Continence       Bowel Continence       Toileting Hygiene      CARE Score -        CARE Score -      Toilet Transfer      CARE Score -        CARE Score -     Shower/Bathe Self      CARE Score -         CARE Score -     Upper Body Dressing      CARE Score -         CARE Score -     Lower Body Dressing      CARE Score -         CARE Score -     On/Off Footwear      CARE Score -         CARE Score -           Transfers Admission Current   Bed to Chair      CARE Score -         CARE Score -       Lying to Sitting      CARE Score -         CARE Score -     Roll Left/Right      CARE Score -         CARE Score -     Sit to Lying CARE Score -         CARE Score -     Sit to Stand      CARE Score -         CARE Score -           Mobility Admission Current   Walk 10 Feet      CARE Score -        CARE Score -       Walk 50 Feet 2 Turns      CARE Score -        CARE Score -     Walk 150 Feet      CARE Score -         CARE Score -     Walk 10 Feet Uneven      CARE Score -         CARE Score -     1 Step (Curb)      CARE Score -         CARE Score -     4 Steps      CARE Score -         CARE Score -     12 Steps      CARE Score -         CARE Score -     Picking Up Object  CARE Score -         CARE Score -     Wheelchair/Scooter Use        Wheel 50 Feet 2 Turns                               Wheel 150 Feet

## 2022-11-19 NOTE — Unmapped (Signed)
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 [name] [name] was admitted to [facility] on 09/25/2022 for ileus, inability to tolerate PO now on supplemental tube feed, and diffuse pain due to muscle spasms of uncertain etiology. [name] [name] has participated in acute inpatient physical and occupational therapies to improve functional mobility, activity tolerance, functional strength, balance, and endurance in order to facilitate safe performance of ADLs and daily routines. She is stable and awaiting rehab placement. She is now admitted to [facility] for comprehensive interdisciplinary rehabilitation.     Rehab Impairment Group Code [facility]): (Debility) 16 Debility (Non-Cardiac/Non-Pulmonary)  Etiology: Diffuse pain due to muscle spasms of uncertain etiology and cutaneous desmoid tumors    PLAN:     REHAB:   - PT and OT to maximize functional status with mobility and ADLs as well as prevention of joint contracture.   - Neuropsych for higher level cognitive evaluation and coping.  - RT for community re-integration, education, and leisure support services.  - Nutrition consult for diet information/teaching.   - To be discussed in weekly Interdisciplinary Team Conference.    Increased Tonicity  Dystonia   Started post-procedurally per patient ~09/25/22. Occurs intermittently throughout the day with a heightened occurrence during mobilization. At rest patient bilateral ankles in plantarflexion with worsening heel cord tightening. Has tonicity most prominent to HF and KE that is not velocity dependent. Has not noted significant improvement with increase of baclofen to 15 mg TID in setting of infection as below. Differential wise, Neurology previously evaluated patient seemingly ruling out an acute dystonic reaction, cord compression, central process, nutritional deficiency, stiff person syndrome and [name] after normal EMG on 8/22. Continues to be well controlled with valium, small improvement with baclofen PRN.  - Continue Baclofen 10 mg TID prn  - Continued Valium 2.5/2.5/5 mg TID and 2.5 mg nightly PRN  - At risk for contractures, Recommend ordering bilateral hard PRAFO boots  - Recommend OOB to chair daily with assistance if appropriate     Headache  Currently managed with tylenol. She does have some posterior neck muscle tightness. Notes ketamine is helpful for headache pains. Describes photophobia and phonophobia with events. I discussed with patient our hope is to decrease risk of rebound headaches and limit medical issues which will interfere with therapy. An additional thought is patient's headaches could be cervicogenic. She does have loss of cervical lordosis on most recent MRI.   - Given patient with elevated heart rate, frequent headaches, and has limited therapy participation 2/2 tachycardia in setting of continuous headaches  - Consider starting propranolol 5 mg BID for headache prophylaxis.   - Prn tylenol +/- prn fiorcet   - Lidocaine cream PRN to area of pain for cervicogenic headaches     Pain  Has a chronic pain provider. Pain management consulted with recommendations as below. Patient will need to follow up with outpatient pain provider ([name]).   - Tylenol 650 mg q6h PRN  - IV Dilaudid 0.5 mg q6h PRN  - Oxyocodone 10-15 mg q4h PRN  - Suboxone 2-0.5 mg TID  - Continue Gabapentin 100 mg TID, can increase to 300mg  TID as tolerated  - Voltaren gel QID, lidocaine patches  - Continue patellar taping with therapy or obtain patellar brace to help with  pain with ambulation     Multifactorial Ileus   C[employer] Emesis 2/2 NGT placement (resolved)  Contributors with history of narcotics, poor PO intake. Concern for malnutrition. Improvements to abdominal pain since weaning opioids. TPN has been discontinued with patient now tolerating tube feeds.  Working on decreasing tube feeds to tolerate p.o. intake  -Continue Tube feeds as guided by nutrition. May discharge home with corpak  -bowel regimen: Naloxegol daily  -NG tube in place  -Trialing food/meds PO prior to NG tube removal   -PO Protonix 40 mg BID      Syncope  Multifactorial. Primary team ddx includes: vasovagal vs adverse effect of medications vs deconditioning vs iron deficiency. Last echo 04/2022  - CTM     IDA  -has allergy to formula iron      [name] - Desmoid Tumors  Follows [facility] (Dr. [name]) is patient's outpatient oncologist.   -Continue nirogacestat 150 mg BID     Enterobacteriaceae group bacteremia (Resolved) - C albicans fungemia (Resolved)   Likely due to RIJ tunneled line placed 01/2022 now removed on 8/14.  8/13, 8/17 repeat blood cultures NGTD.  No evidence of vitreitis, retinitis 8/15 per Ophtho.  TTE 8/15 w possible vegetations on MV and TV or indwelling catheter; however, TEE 8/20 w/o vegetations. ID consulted with recommendations as below. Completed treatment with cephalosporin and fluconazole on 8/31.      Generalized anxiety disorder, depression, h/o panic attacks  Psych consulted with recommendations as below.   - Consider decreasing mirtazapine 15 mg nightly to 7.5 mg nightly due to somnolence  - Valium as above, If needed could give 2.5mg  PRN for acute anxiety.    Daily Checklist  - Diet: Regular diet + nighttime tube feeds  - DVT PPX: SQ enoxaparin   - GI PPX: Protonix  - Access: R IJ non tunneled CVC    Code status: Full    DISPO: Admitted to Rehab floor. Patient will be discussed at next interdisciplinary team conference.     Estimated Length of Stay: 12 days    Anticipated Post-Rehab Destination / Needs: home    Medical Necessity:  The patient requires acute inpatient rehabilitation to maximize functional independence and requires daily physician visits for monitoring/management of  ongoing diffuse pain and muscle stiffness/dystonia on opioid pain regimen including suboxone, nutritional needs in setting of ileus and poor po intake requiring NG-tube for tube feeds that will need to be weaned, mood, electrolytes, bowel/bladder, vital signs, anticoagulation, neurological exam, medications, skin, spasticity, and pain control. This patient's rehabilitation goals and medical complexity could not adequately be managed in a less intensive setting.  Potential risks for clinical complications include falls, adverse medication reactions, DVT/PE, pressure ulcers, aspiration, urinary tract infection, dehydration/malnutrition, worsening of underlying disease, and bleeding.     SUBJECTIVE:     Reason for Admission: Comprehensive interdisciplinary inpatient rehabilitation program.    History of Present Illness:   22 y.o. 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 Patient is currently hospitalized since 09/25/2022.      Today, patient notes that her pain is tolerable on her current regimen. She has some anxiety surrounding transfer to [facility] due to change of scenery. Denies nausea or vomiting. Patient denies having pain. Denies fever/sweats/chills. No recent weight changes  or extremity swelling. Denies HA, dizziness, vision changes, changes in strength or sensation, issues with balance or coordination. Denies chest pain, palpitations, SOB, cough. Eating well tolerating PO, no difficulty or pain with swallowing. No N/V or abdominal pain. No issues with incontinence. BMs regular, no constipation or diarrhea. No difficulty urinating, leaking urine, dysuria. Patient sleeping well. No issues with mood.    As part of the admission process, I discussed medical management of this patient???s case with Dr. Eulah Citizen    Pre-Morbid Functional Status: Pre-Admission Functional Status:   Pt reports following her last hospitalization she was able to go to the beach briefly and ambulate on the sand. Was also able to play mini-golf but unable to bend down to grab a ball. Since her egg retrieval and the initiation of her new chemo medication, she has been spending most of her time in bed. No falls in the past month, but did have a few falls last hospitalization due to syncope.         Current Functional Status:  ADLs: Needs assistance with ADLs     ADLs - Needs Assistance: LB dressing; Bathing; Grooming; Toileting; UB dressing; Feeding     Feeding - Needs Assistance: Set Up Assist     Grooming - Needs Assistance: Performed seated; Supervision     Bathing - Needs Assistance: Mod assist     Toileting - Needs Assistance: Min assist     UB Dressing - Needs Assistance: Contact Guard assist     LB Dressing - Needs Assistance: Mod assist; Max assist     Mobility:   Bed Mobility comments: pt completed sup to sit Mod A for LE management, HOB elevated, able to manage trunk CGA but increased time needed due to pain/fatigue     Transfers: STS from EOB Min A w/ RW, cues for setup and anterior lean. Increased time for completion. STS from EOC  x 4 bouts after sitting rest breaks w/ RW CGA, PT cued for anterior trunk lean and nose over toes     Skilled Treatment Performed: Pt able to ambulate x 27ft x 4 bouts w/ RW CGA, significantly diminished gait speed, decreased foot clearance and narrow BoS. Pt demo anterior trunk lean and reuqired cues from PT for posture correction. W/ increased fatigue pt demonstrated decreased stride length and increase in shuffle patterning. No overt LOB. Pt required seated rest breaks for tolerane and VS monitored closely. Chair follow.        Cognition/Swallow/Speech:  Patient's Vision Adequate to Safely Complete Daily Activities: Yes     Patient's Judgement Adequate to Safely Complete Daily Activities: Yes     Patient's Memory Adequate to Safely Complete Daily Activities: Yes     Patient Able to Express Needs/Desires: Yes     Patient has speech problem: No     Precautions:  Falls    Medical / Surgical History: Reviewed  Past Medical History:   Diagnosis Date    Abdominal pain     Acid reflux     occas    Anesthesia complication     itching, shaking, coldness; last few surgeries have gone much better    Cancer (CMS-HCC)     Cataract of right eye     COVID-19 virus infection 01/2019    Cyst of thyroid determined by ultrasound     monitoring    [name]     2 right forearm, 1 left thigh, 1 right scapula, 1 under left clavicle; multiple  Difficult intravenous access     [name] (familial adenomatous polyposis)     [name]     Gastric polyps     History of chemotherapy     last treatment approx 05/2019    History of colon polyps     History of COVID-19 01/2019    Iron deficiency anemia due to chronic blood loss     received iron infusion 11-2019    PONV (postoperative nausea and vomiting)     Rectal bleeding     Syncopal episodes     especially if becoming dehydrated     Past Surgical History:   Procedure Laterality Date    COLON SURGERY      cyroablation      cystis removal      desmoid removal      PR CLOSE ENTEROSTOMY,RESEC+ANAST N/A 10/09/2020    Procedure: ILEOSTOMY TAKEDOWN;  Surgeon: Mickle Asper, MD;  Location: OR [place];  Service: General Surgery    PR COLONOSCOPY W/BIOPSY SINGLE/MULTIPLE N/A 10/27/2012    Procedure: COLONOSCOPY, FLEXIBLE, PROXIMAL TO SPLENIC FLEXURE; WITH BIOPSY, SINGLE OR MULTIPLE;  Surgeon: Shirlyn Goltz Mir, MD;  Location: PEDS PROCEDURE ROOM [place];  Service: Gastroenterology    PR COLONOSCOPY W/BIOPSY SINGLE/MULTIPLE N/A 09/14/2013    Procedure: COLONOSCOPY, FLEXIBLE, PROXIMAL TO SPLENIC FLEXURE; WITH BIOPSY, SINGLE OR MULTIPLE;  Surgeon: Shirlyn Goltz Mir, MD;  Location: PEDS PROCEDURE ROOM [place];  Service: Gastroenterology    PR COLONOSCOPY W/BIOPSY SINGLE/MULTIPLE N/A 11/08/2014    Procedure: COLONOSCOPY, FLEXIBLE, PROXIMAL TO SPLENIC FLEXURE; WITH BIOPSY, SINGLE OR MULTIPLE;  Surgeon: Arnold Long Mir, MD;  Location: PEDS PROCEDURE ROOM [facility]  Service: Gastroenterology    PR COLONOSCOPY W/BIOPSY SINGLE/MULTIPLE N/A 12/26/2015    Procedure: COLONOSCOPY, FLEXIBLE, PROXIMAL TO SPLENIC FLEXURE; WITH BIOPSY, SINGLE OR MULTIPLE;  Surgeon: Arnold Long Mir, MD;  Location: PEDS PROCEDURE ROOM [facility];  Service: Gastroenterology    PR COLONOSCOPY W/BIOPSY SINGLE/MULTIPLE N/A 09/02/2017    Procedure: COLONOSCOPY, FLEXIBLE, PROXIMAL TO SPLENIC FLEXURE; WITH BIOPSY, SINGLE OR MULTIPLE;  Surgeon: Arnold Long Mir, MD;  Location: PEDS PROCEDURE ROOM [place];  Service: Gastroenterology    PR COLSC FLX W/REMOVAL LESION BY HOT BX FORCEPS N/A 08/27/2016    Procedure: COLONOSCOPY, FLEXIBLE, PROXIMAL TO SPLENIC FLEXURE; W/REMOVAL TUMOR/POLYP/OTHER LESION, HOT BX FORCEP/CAUTE;  Surgeon: Arnold Long Mir, MD;  Location: PEDS PROCEDURE ROOM [facility];  Service: Gastroenterology    PR COLSC FLX W/RMVL OF TUMOR POLYP LESION SNARE TQ N/A 02/25/2019    Procedure: COLONOSCOPY FLEX; W/REMOV TUMOR/LES BY SNARE;  Surgeon: Helyn Numbers, MD;  Location: GI PROCEDURES [place] [facility];  Service: Gastroenterology    PR COLSC FLX W/RMVL OF TUMOR POLYP LESION SNARE TQ N/A 03/13/2020    Procedure: COLONOSCOPY FLEX; W/REMOV TUMOR/LES BY SNARE;  Surgeon: Helyn Numbers, MD;  Location: [facility] PROCEDURES [facility] [facility];  Service: Gastroenterology    PR EXC SKIN BENIG 2.1-3 CM TRUNK,ARM,LEG Right 02/25/2017    Procedure: EXCISION, BENIGN LESION INCLUDE MARGINS, EXCEPT SKIN TAG, LEGS; EXCISED DIAMETER 2.1 TO 3.0 CM;  Surgeon: Clarene Duke, MD;  Location: CHILDRENS OR [facility];  Service: Plastics    PR EXC SKIN BENIG 3.1-4 CM TRUNK,ARM,LEG Right 02/25/2017    Procedure: EXCISION, BENIGN LESION INCLUDE MARGINS, EXCEPT SKIN TAG, ARMS; EXCISED DIAMETER 3.1 TO 4.0 CM;  Surgeon: Clarene Duke, MD;  Location: CHILDRENS OR [facility];  Service: Plastics    PR EXC SKIN BENIG >4 CM FACE,FACIAL Right 02/25/2017    Procedure: EXCISION, OTHER BENIGN LES INCLUD MARGIN, FACE/EARS/EYELIDS/NOSE/LIPS/MUCOUS  MEMBRANE; EXCISED DIAM >4.0 CM;  Surgeon: Clarene Duke, MD;  Location: CHILDRENS OR [facility];  Service: Plastics    PR EXC TUMOR SOFT TISSUE LEG/ANKLE SUBQ 3+CM Right 08/05/2019    Procedure: EXCISION, TUMOR, SOFT TISSUE OF LEG OR ANKLE AREA, SUBCUTANEOUS; 3 CM OR GREATER;  Surgeon: Arsenio Katz, MD;  Location: MAIN OR [place];  Service: Plastics    PR EXC TUMOR SOFT TISSUE LEG/ANKLE SUBQ <3CM Right 08/05/2019    Procedure: EXCISION, TUMOR, SOFT TISSUE OF LEG OR ANKLE AREA, SUBCUTANEOUS; LESS THAN 3 CM;  Surgeon: Arsenio Katz, MD;  Location: MAIN OR [facility];  Service: Plastics    PR LAP, SURG PROCTECTOMY W J-POUCH N/A 08/10/2020    Procedure: ROBOTIC ASSISTED LAPAROSCOPIC PROCTOCOLECTOMY, ILEAL J POUCH, WITH OSTOMY;  Surgeon: Mickle Asper, MD;  Location: OR [place];  Service: General Surgery    PR NDSC EVAL INTSTINAL POUCH DX W/COLLJ SPEC SPX N/A 01/23/2021    Procedure: ENDO EVAL SM INTEST POUCH; DX;  Surgeon: Modena Nunnery, MD;  [facility]: [facility] [place] [facility];  Service: Gastroenterology    PR NDSC EVAL INTSTINAL POUCH DX W/COLLJ SPEC SPX N/A 08/27/2021    Procedure: ENDO EVAL SM INTEST POUCH; DX;  Surgeon: Hunt Oris, MD;  [facility];  Service: Gastroenterology    PR NDSC EVAL INTSTINAL POUCH DX W/COLLJ SPEC SPX N/A 12/09/2021    Procedure: ENDO EVAL SM INTEST POUCH; DX;  Surgeon: Vidal Schwalbe, MD;  [facility]: [facility]URES [facility];  Service: Gastroenterology    PR NDSC EVAL INTSTINAL POUCH DX W/COLLJ [facility] SPX Left 04/09/2022    Procedure: ENDO EVAL SM INTEST POUCH; DX;  Surgeon: Modena Nunnery, MD;  Location: [facility] [place] [facility];  Service: Gastroenterology    PR NDSC EVAL INTSTINAL POUCH DX W/COLLJ SPEC SPX N/A 08/05/2022    Procedure: ENDO EVAL SM INTEST POUCH; DX;  Surgeon: Modena Nunnery, MD;  Location: [facility]URES [facility];  Service: Gastroenterology    PR NDSC EVAL INTSTINAL POUCH W/BX SINGLE/MULTIPLE N/A 01/20/2022    Procedure: ENDOSCOPIC EVAL OF SMALL INTESTINAL POUCH; DIAGNOSTIC, No biopsies;  Surgeon: Andrey Farmer, MD;  Location: GI PROCEDURES [facility];  Service: Gastroenterology    PR NDSC EVAL INTSTINAL POUCH W/BX SINGLE/MULTIPLE N/A 02/13/2022    Procedure: ENDOSCOPIC EVAL OF SMALL INTESTINAL POUCH; DIAGNOSTIC, WITH BIOPSY;  Surgeon: Bronson Curb, MD;  Location: GI PROCEDURES [facility];  Service: Gastroenterology    PR UNLISTED PROCEDURE SMALL INTESTINE  01/23/2021    Procedure: UNLISTED PROCEDURE, SMALL INTESTINE;  Surgeon: Modena Nunnery, MD;  Location: [facility] PROCED[facility] [place] [facility];  Service: Gastroenterology    PR UNLISTED PROCEDURE SMALL INTESTINE  02/13/2022    Procedure: UNLISTED PROCEDURE, SMALL INTESTINE;  Surgeon: Bronson Curb, MD;  [facility]: [facility]URES [facility];  Service: Gastroenterology    PR UPPER GI ENDOSCOPY,BIOPSY N/A 10/27/2012    Procedure: UGI ENDOSCOPY; WITH BIOPSY, SINGLE OR MULTIPLE;  Surgeon: Shirlyn Goltz Mir, MD;  Location: PEDS PROCEDURE ROOM [facility];  Service: Gastroenterology    PR UPPER GI ENDOSCOPY,BIOPSY N/A 09/14/2013    Procedure: UGI ENDOSCOPY; WITH BIOPSY, SINGLE OR MULTIPLE;  Surgeon: Shirlyn Goltz Mir, MD;  Location: PEDS PROCEDURE ROOM [facility];  Service: Gastroenterology    PR UPPER GI ENDOSCOPY,BIOPSY N/A 11/08/2014    Procedure: UGI ENDOSCOPY; WITH BIOPSY, SINGLE OR MULTIPLE;  Surgeon: Arnold Long Mir, MD;  Location: PEDS PROCEDURE ROOM [facility];  Service: Gastroenterology    PR UPPER GI ENDOSCOPY,BIOPSY  N/A 12/26/2015    Procedure: UGI ENDOSCOPY; WITH BIOPSY, SINGLE OR MULTIPLE;  Surgeon: Arnold Long Mir, MD;  Location: PEDS PROCEDURE ROOM [facility]  Service: Gastroenterology    PR UPPER GI ENDOSCOPY,BIOPSY N/A 08/27/2016    Procedure: UGI ENDOSCOPY; WITH BIOPSY, SINGLE OR MULTIPLE;  Surgeon: Arnold Long Mir, MD;  Location: PEDS PROCEDURE ROOM [facility];  Service: Gastroenterology    PR UPPER GI ENDOSCOPY,BIOPSY N/A 09/02/2017    Procedure: UGI ENDOSCOPY; WITH BIOPSY, SINGLE OR MULTIPLE;  Surgeon: Arnold Long Mir, MD;  Location: PEDS PROCEDURE ROOM [facility];  Service: Gastroenterology    PR UPPER GI ENDOSCOPY,BIOPSY N/A 03/13/2020    Procedure: UGI ENDOSCOPY; WITH BIOPSY, SINGLE OR MULTIPLE;  Surgeon: Helyn Numbers, MD;  Location: [facility]EDURES [place] [facility];  Service: Gastroenterology    PR UPPER GI ENDOSCOPY,BIOPSY N/A 09/05/2021    Procedure: UGI ENDOSCOPY; WITH BIOPSY, SINGLE OR MULTIPLE;  Surgeon: Wendall Papa, MD;  [facility]: [facility] PROC[facility]URES [facility];  Service: Gastroenterology    PR UPPER GI ENDOSCOPY,DIAGNOSIS N/A 01/20/2022    Procedure: UGI ENDO, INCLUDE ESOPHAGUS, STOMACH, & DUODENUM &/OR JEJUNUM; DX W/WO COLLECTION SPECIMN, BY BRUSH OR WASH;  Surgeon: Andrey Farmer, MD;  Location: GI PROCEDURES [facility];  Service: Gastroenterology    TUMOR REMOVAL      multiple-head, neck, back, hand, right flank, multiple     Social History: Reviewed  Social History     Tobacco Use    Smoking status: Never     Passive exposure: Past    Smokeless tobacco: Never   Vaping Use    Vaping status: Never Used   Substance Use Topics    Alcohol use: Never    Drug use: Never          Family History: Pertinent as stated and otherwise reviewed and non-contributory   family history includes Alcohol abuse in her paternal grandfather; Arthritis in her maternal grandfather; Asthma in her maternal grandfather; COPD in her paternal grandmother; Cancer in her maternal grandmother; Diabetes in her maternal grandmother; Hypertension in her maternal grandmother; Miscarriages / Stillbirths in her paternal grandmother; No Known Problems in her brother, father, maternal aunt, maternal uncle, mother, paternal aunt, paternal uncle, and sister; Other in her maternal grandmother; Stroke in her maternal grandmother; Thyroid disease in her maternal grandmother.    Allergies: Reviewed  Adhesive tape-silicones; Ferrlecit [sodium ferric gluconat-sucrose]; Levofloxacin; Methylnaltrexone; Neomycin; Papaya; Morphine; Zosyn [piperacillin-tazobactam]; Compazine [prochlorperazine]; Iron analogues; Iron dextran; and Latex, natural rubber    Medications at Discharge from Acute Hospital: Reviewed     Your Medication List         Notice    Cannot display discharge medications because the patient has not yet been admitted.       Review of Systems:    Full 10 systems reviewed and negative, other than as noted in the HPI.    OBJECTIVE:     Vitals:  Temp:  [36.4 ??C (97.5 ??F)-36.5 ??C (97.7 ??F)] 36.4 ??C (97.5 ??F)  Heart Rate:  [90-97] 90  Resp:  [16-17] 16  BP: (107-118)/(62-78) 107/78  MAP (mmHg):  [79-87] 87  SpO2:  [97 %-100 %] 97 %      Physical Exam:  GEN: Pleasant female sitting comfortably in recliner in no acute distress  EYES: Sclera anicteric, conjunctiva clear   HENT: NCAT, MMM, OP clear, NGT in place  NECK: Trachea midline  RESP: CTAB, NWOB on RA  CV: Rate as above, regular rhythm  GI: Not distended  SKIN: Without lesions,  bruises, ecchymosis, rashes on clothed examination   MSK:TTP to bilateral knees (R>L) with passive/active range of motion   NEURO:   Mental Status: A&Ox3, attention intact, speech fluid and coherent, follows commands well and answers questions appropriately   Cranial Nerve: CN II-XII grossly intact   Sensory: BUE and BLE sensation intact to light touch   Motor:   - RUE 5/5 shoulder abd, 5/5 EF, 5/5 EE, 5/5 WE, 4+/5 HG  - LUE 5/5 shoulder abd, 5/5 EF, 5/5 EE, 5/5 WE, 4+/5 HG  - RLE 2/5 HF, 3/5 KE, 2/5 DF, 3/5 PF  - LLE 2/5 HF, 3/5 KE, 1/5 DF, 3/5 PF  *Lower extremity exam limited by pain   Tone: Stable tone most prominent to bilateral lower extremities with hip flexion, knee flexor & knee extension, tone is variable, there is increasd tone to bilateral gastroc-soleus complex    Reflexes: No clonus   Cerebellar: no abnormal or extraneous movements   Gait: Deferred  PSYCH: Mood euthymic, affect appropriate, thought process logical     Labs and Diagnostic Studies: Reviewed    Radiology Results: Reviewed    Joylene Grapes, MD  [facility]   Physical Medicine and Rehabilitation, PGY2    Dictation software was used while making this note. Please excuse any errors made with dictation software and interpret errors as such.

## 2022-11-19 NOTE — Unmapped (Signed)
Physician Discharge Summary [facility]  [address] [facility]  [address]  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 *      Outpatient Provider Follow Up Issues:   -Please ensure patient follows up with chronic pain team upon discharge from acute rehab.  Dr. Darlis Loan, [facility]. is patient's outpatient chronic pain provider.  -Please message Dr. Darlis Loan, [facility] Hageman. 7 days prior to discharge from acute rehab to coordinate follow-up.  -Please wean IV Dilaudid as able.  -Please wean enteral nutrition with Corpak/NG tube as able.  -Please have nutrition/dietitian see patient well at acute rehab  -Patient currently has nontunneled right IJ triple-lumen central venous line which was placed after bacteremia from previous tunneled right IJ central venous access line.  This line must be removed prior to discharge from rehab, of note she has very poor peripheral access and has lost multiple peripheral IVs, she has had thromboses with PICC lines hence current central line.  If she is off all IV medications, central line can be removed.  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 Hospital course complicated by severe ileus most likely due to narcotic bowel.    Muscle spasms/rigidity  Patient had episodes of diffuse muscle spasms and rigidity without clear cause.  No seizure-like sequelae.  Noted to have elevated lactic acidosis during episode up to 8.3 which subsequently down trended.  No fevers, no hemodynamic instability, no significant leukocytosis, however patient complained of rigors and chills prior to procedure with VIR. Placed on broad-spectrum antibiotics x 48 hrs until infectious workup (BCx, UA/UCx, CXR, CT C/A/P) was negative. There was concern that episodes were related to general anesthesia with VIR procedure, however, patient has tolerated these medications before without issues.  She did not have any significant fevers concerning for malignant hyperthermia.  Neurology evaluated and was concerned about possible desmoid tumor infiltration into spine, but MRI  C/T/L spine was unrevealing.  Paraneoplastic panel showed isolated weakly positive calcium channel binding P/Q; per Neurology, this is a nonspecific and clinically insignificant finding most likely related to her underlying history of desmoid tumor and did not explain her muscle spasms.  She underwent EMG that was normal although with ongoing rigidity and stiffness she was empirically started on diazepam for possible stiff person syndrome guided by neurology.  An autoimmune myelopathy panel returned within normal limits and negative for anti-GAD antibodies making stiff person syndrome unlikely. She was managed with baclofen and scheduled valium. Continued to have symptoms that were improved with diazepam. Baclofen weaned to as needed. She was ultimately discharged to [facility] to continue building strength and was discharged with scheduled diazepam.     GNR Enterobactericeae bacteremia/Candida fungemia 8/13 : Developed fevers and found to have GNR and yeast in her blood.  Source presumably her RIJ tunneled line that she had had since 01/2022 for TPN in the past.  Tunneled line removed and triple lumen central line placed 8/14.  Negative cultures since 8/17.  ID consulted.  TEE without vegetations.  Per ID, she received cefazolin and fluconazole through 8/31.    [name], desmoid fibromatosis  Followed by Dr. [name] of [facility].  Had recently started nirogacestat, but this was intermittently held during this stay. Restarted on nirogacestat 50 mg BID on 9/3 with plans to advance by 50 mg each week to goal of 150 mg BID. Should increase to goal dose 150mg  BID on 9/17.     Chronic pain with acute exacerbation  Patient had significant pain during admission requiring escalation of her home oxycodone as well as initiation of IV hydromorphone due to inability to tolerate PO intake.  However, subsequently developed significant ileus likely due to narcotic bowel.  Chronic Pain anesthesia team consulted and facilitated lidocaine infusion 7/21-7/24.  Continued to require opioids and had ongoing ileus symptoms, so was started on ketamine infusion 8/1 and weaned off all full agonist opioids by 8/3.  After completion of ketamine 8/9, pain again became an issue in the setting of her line infection on 8/13 and was restarted on IV hydromorphone and oxycodone.  Pain was located in abdomen and limbs, then developed acute back pain.  Imaging including CT L spine, pelvic and upper thighs negative for acute changes.  XR knees showed normal alignment.  Was able to taper meds but continued to require ATC opiates, so was seen again by Pain on 9/3.  Suboxone increased from 0.5/0.125 to 2/0.5 mg tid to try to aid weaning IV Dilaudid.    Ileus due to narcotic bowel  Need for parenteral nutrition  Found to have significant ileus on AXR shortly after admission. CT A/P was also consistent with ileus, with no discrete transition point.  She was unable to tolerate PO intake and had recurrent nausea and vomiting requiring NG tube for decompression.  Due to multiple days without ability to tolerate PO intake and weakness, she was started on TPN on 7/22 (had previously been able to stop as outpatient in 06/2022).  High suspicion for narcotic bowel related to chronic opioid use, exacerbated by acute illness.  Had no improvement with naloxegol, possibly due to minimal absorption.  Treated with methylnaltrexone and was gradually weaned off opioids with ketamine infusion as above. Once patient had adequate return of bowel function, her diet was slowly advanced.  An ND Corpak tube was placed at the time of her TEE, and she was continued on continuous tube feeds. She  tolerated tube feeds and improving oral intake with regular BMs, so was changed to cyclic feeding on 9/6.     Transaminitis.  Periodic rises in AST and ALT.  Liver ultrasound with Doppler has been unremarkable.  Attributed to TPN.  Then had another rise in setting of antibiotics which also self resolved.    Syncope/pre-syncope  While attempting ambulation, patient continued to have periodic episodes of lightheadedness and occasional brief episodes of syncope, possibly related to muscle rigidity as noted above. Associated with sinus tachycardia. Negative work up included CTA chest (neg for PE), TTE, cosyntropin stim test, thyroid studies, and EKG reassuring.  Given profound iron deficiency, IV iron infusion was arranged and given (see below). Continued to be monitored on telemetry during admission.    Iron deficiency anemia  Ferritin 5, iron saturation 6%. Has had prior reactions with numerous attempts to give IV iron. As patient had recently seen Allergy in the outpatient setting for recurrent iron reactions, IV iron infusion (Feraheme 1020 mg) was given while inpatient. Unfortunately preferred iron formulation (monoferric) was not approved. Preferred pre-med protocol included a one week long course of cetirizine 20 mg BID, though this was shortened to 48 hours while inpatient (after discussion with allergy and shared decision making with the patient). Steroid pretreatment was given as follows: prednisone 50 mg 13 hours prior, prednisone 50 mg 7 hours prior, and hydrocortisone 40 mg 30 minutes prior. IV benadryl and famotidine 40 mg IV were given about 30 minutes prior to the infusion as well. Unfortunately she did still have an itchy rash develop but this improved with slowing the infusion and giving further doses of IV benadryl.     Generalized anxiety disorder/depression:  Previously on lorazepam which has been changed to diazepam after discussion with psychiatry for stiffness. Was seen by Psychiatry during the admission and given supportive psychotherapy.    Gastric ulcer  Patient with history of gastric polyps and GI bleeding related to ileoanal pouchitis and ulcerations. While on NG tube to LIS this admission, noted to have coffee ground output, however hemoglobin remained stable. Gastroenterology consulted and recommended conservative management with PPI BID serial hemoglobin monitoring.     Small intestinal bacterial overgrowth  Home antibiotics with cefdinir and doxycycline were held in setting of broad-spectrum antibiotics and inability to tolerate PO intake.    Procedures:  Right internal jugular central line 10/22/22  ______________________________________________________________________  Discharge Medications:     Your Medication List        STOP taking these medications      hydrocortisone 2.5 % rectal cream  Commonly known as: ANUSOL-HC  Replaced by: hydrocortisone 2.5 % cream     LORazepam 0.5 MG tablet  Commonly known as: ATIVAN     ondansetron 4 MG disintegrating tablet  Commonly known as: ZOFRAN-ODT  Replaced by: ondansetron 4 mg/2 mL injection     zinc oxide 10 % Crea            START taking these medications      alum-mag-simeth 200-200-20 mg/5 mL Susp  Commonly known as: MAALOX PLUS  Take 60 mL by mouth every six (6) hours as needed.     bisacodyl 10 mg suppository  Commonly known as: DULCOLAX  Insert 1 suppository (10 mg total) into the rectum two (2) times a day as needed.     butalbital-acetaminophen-caffeine 50-325-40 mg per tablet  Commonly known as: ESGIC  Take 1 tablet by mouth two (2) times a day as needed for headache for  up to 15 days.     carboxymethylcellulose sodium 1 % Dpge  Commonly known as: REFRESH CELLUVISC  Administer 1 drop to both eyes four (4) times a day.     diazePAM 5 MG tablet  Commonly known as: VALIUM  Take 0.5 tablets (2.5 mg total) by mouth nightly as needed for other (if increased muscle rigidity or anxiety not responding to the 5 mg scheduled dose).     diazePAM 5 MG tablet  Commonly known as: VALIUM  Take 1 tablet (5 mg total) by mouth nightly.     diazePAM 5 MG tablet  Commonly known as: VALIUM  Take 0.5 tablets (2.5 mg total) by mouth daily with evening meal AND 0.5 tablets (2.5 mg total) daily with evening meal. Do all this for 5 days.  Start taking on: November 20, 2022     gabapentin 100 MG capsule  Commonly known as: NEURONTIN  Take 1 capsule (100 mg total) by mouth Three (3) times a day.     guaiFENesin 100 mg/5 mL syrup  Commonly known as: ROBITUSSIN  Take 10 mL (200 mg total) by mouth every four (4) hours as needed for congestion.     hydrocortisone 2.5 % cream  Apply topically two (2) times a day as needed (hemorrhoids).  Replaces: hydrocortisone 2.5 % rectal cream     HYDROmorphone (PF) 1 mg/mL injection  Commonly known as: DILAUDID  Infuse 0.5 mL (0.5 mg total) into a venous catheter every six (6) hours as needed for up to 5 days.     lidocaine-diphenhydrAMINE-aluminum-magnesium 25-200-400-40 mg/48mL Mwsh  Commonly known as: FIRST-MOUTHWASH BLM  10 mL by Mouth route four (4) times a day as needed.     multivitamin with folic acid 400 mcg Tab tablet  Take 1 tablet by mouth daily.  Start taking on: November 20, 2022     naloxegol 12.5 mg tablet  Commonly known as: MOVANTIK  Take 1 tablet (12.5 mg total) by mouth daily.  Start taking on: November 20, 2022     ondansetron 4 mg/2 mL injection  Commonly known as: ZOFRAN  Infuse 2 mL (4 mg total) into a venous catheter every twelve (12) hours as needed.  Replaces: ondansetron 4 MG disintegrating tablet     pantoprazole 40 MG tablet  Commonly known as: Protonix  Take 1 tablet (40 mg total) by mouth two (2) times a day.     phenol 1.4 % Spra  Commonly known as: CHLORASEPTIC  2 sprays by Mucous Membrane route every two (2) hours as needed.     polyethylene glycol 17 gram packet  Commonly known as: MIRALAX  Take 17 g by mouth two (2) times a day as needed.     pseudoephedrine 30 MG tablet  Commonly known as: SUDAFED  Take 1 tablet (30 mg total) by mouth daily as needed for congestion.     senna 8.6 mg tablet  Commonly known as: SENOKOT  Take 1 tablet by mouth two (2) times a day as needed for constipation.     sodium chloride 0.9 % solution  Commonly known as: NS  Infuse 10 mL/hr into a venous catheter continuous.     zinc oxide-cod liver oil 40 % Pste  Commonly known as: DESITIN 40%  Apply topically daily as needed.            CHANGE how you take these medications      acetaminophen 325 MG tablet  Commonly known as: TYLENOL  Take 2  tablets (650 mg total) by mouth every six (6) hours as needed.  What changed:   medication strength  how much to take  when to take this  reasons to take this     baclofen 10 MG tablet  Commonly known as: LIORESAL  Take 1 tablet (10 mg total) by mouth Three (3) times a day as needed for muscle spasms.  What changed:   medication strength  how much to take     buprenorphine-naloxone 2-0.5 mg sublingual film  Commonly known as: SUBOXONE  Place 0.5 Film (1 mg of buprenorphine total) under the tongue Three (3) times a day for 5 days.  What changed:   how much to take  when to take this  additional instructions     diclofenac sodium 1 % gel  Commonly known as: VOLTAREN  Apply 2 g topically four (4) times a day.  What changed:   when to take this  reasons to take this     mirtazapine 15 MG tablet  Commonly known as: REMERON  Take 1 tablet (15 mg total) by mouth nightly.  What changed:   how much to take  how to take this  when to take this  additional instructions     nirogacestat 100 mg tablet  Commonly known as: OGSIVEO  Take 1 tablet (100 mg total) by mouth two (2) times a day. Swallow tablets whole; do not break, crush, or chew.  What changed:   medication strength  how much to take     nirogacestat 150 mg tablet  Commonly known as: OGSIVEO  Take 1 tablet (150 mg total) by mouth two (2) times a day. Swallow tablets whole; do not break, crush, or chew.  Start taking on: November 25, 2022  What changed: You were already taking a medication with the same name, and this prescription was added. Make sure you understand how and when to take each.     oxyCODONE 10 mg immediate release tablet  Commonly known as: ROXICODONE  Take 1 tablet (10 mg total) by mouth every four (4) hours as needed for up to 5 days.  What changed:   when to take this  reasons to take this  additional instructions     oxyCODONE 15 MG immediate release tablet  Commonly known as: ROXICODONE  Take 1 tablet (15 mg total) by mouth every four (4) hours as needed for up to 5 days.  What changed: You were already taking a medication with the same name, and this prescription was added. Make sure you understand how and when to take each.            CONTINUE taking these medications      cefdinir 300 MG capsule  Commonly known as: OMNICEF  Take 1 capsule (300mg ) twice a day on Mon, Wed, Friday     cholecalciferol (vitamin D3 25 mcg (1,000 units)) 1,000 unit (25 mcg) tablet  Take 1 tablet (25 mcg total) by mouth daily.     clobetasol 0.05 % ointment  Commonly known as: TEMOVATE  Apply topically two (2) times a day. To stubborn/thick rashes on hands/feet ears until clear/smooth. Restart as needed     doxycycline 100 MG tablet  Commonly known as: VIBRA-TABS  TAKE 1 TABLETS BY MOUTH TWICE A DAY ON TUESDAY, THURSDAY, SATURDAY, SUNDAY.     famotidine 20 MG tablet  Commonly known as: PEPCID  Take 1 tablet (20 mg total) by mouth nightly.     lidocaine  4 % patch  Commonly known as: ASPERCREME  Place 2 patches on the skin daily.  Start taking on: November 20, 2022     naloxone 4 mg/actuation nasal spray  Commonly known as: NARCAN  One spray in either nostril once for known/suspected opioid overdose. May repeat every 2-3 minutes in alternating nostril til EMS arrives     pimecrolimus 1 % cream  Commonly known as: ELIDEL  Apply topically two (2) times a day as needed. To face as needed for rash     promethazine 12.5 MG tablet  Commonly known as: PHENERGAN  Take 1 tablet (12.5 mg total) by mouth every six (6) hours as needed.     triamcinolone 0.1 % cream  Commonly known as: KENALOG  Apply topically two (2) times a day as needed. Apply to rash as needed              Allergies:  Adhesive tape-silicones; Ferrlecit [sodium ferric gluconat-sucrose]; Levofloxacin; Methylnaltrexone; Neomycin; Papaya; Morphine; Zosyn [piperacillin-tazobactam]; Compazine [prochlorperazine]; Iron analogues; Iron dextran; and Latex, natural rubber  ______________________________________________________________________  Pending Test Results (if blank, then none):      Most Recent Labs:  All lab results last 24 hours - No results found for this or any previous visit (from the past 24 hour(s)).    Relevant Studies/Radiology (if blank, then none):  PVL Venous Duplex Lower Extremity Bilateral    Result Date: 11/14/2022   Peripheral Vascular Lab     [address]   [place], [place] [address]  PVL VENOUS DUPLEX LOWER EXTREMITY BILATERAL Patient Demographics Pt. Name: [name] [name] Location: PVL Inpatient Lab MRN:      [id number]               Sex:      F DOB:      08-11-2000              Age:      10 years  Study Information Authorizing         [id number] DAVID FARID     Performed Time       11/13/2022 10:35:16 Provider Name       [facility]                                     PM Ordering Physician  Candace Cruise [facility]        Patient Location     [facility] Accession Number    [id number] UN         Technologist         Marcy Siren RVT Diagnosis:                                Dispensing optician Ordered Reason For Exam: swelling, bed bound Indication: Risk Factors: Chemotherapy, immobility and (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 Protocol The major deep veins from the inguinal ligament to the ankle are assessed for bilaterally for compressibility and color and spectral Doppler flow characteristics. The assessed veins include bilateral common femoral vein, femoral vein in the thigh, popliteal vein, and intramuscular  calf veins. The iliac vein is assessed indirectly using Doppler waveform analysis. The great saphenous vein is assessed for compressibility at the saphenofemoral junction, and the small saphenous vein assessed for compressibility behind the knee.  Final Interpretation Right There is no evidence of DVT in the lower extremity. There is no evidence of obstruction proximal to the inguinal ligament or in the common femoral vein. Left There is no evidence of DVT in the lower extremity. There is no evidence of obstruction proximal to the inguinal ligament or in the common femoral vein.  Electronically signed by [id number] Jodell Cipro MD on 11/14/2022 at 8:22:18 AM.  -------------------------------------------------------------------------------- Right Duplex Findings All veins visualized appear fully compressible. Doppler flow signals demonstrate normal spontaneity, phasicity, and augmentation.  Left Duplex Findings All veins visualized appear fully compressible. Doppler flow signals demonstrate normal spontaneity, phasicity, and augmentation. Right Technical Summary No evidence of deep venous obstruction in the lower extremity. No indirect evidence of obstruction proximal to the inguinal ligament. Left Technical Summary No evidence of deep venous obstruction in the lower extremity. No indirect evidence of obstruction proximal to the inguinal ligament.  Final     XR Abdomen 1 View    Result Date: 11/13/2022  EXAM: XR ABDOMEN 1 VIEW ACCESSION: [id number] UN CLINICAL INDICATION: 22 years old with CATHETER NON VASCULAR FIT&ADJ  COMPARISON: Abdominal radiograph dated 10/28/2022 TECHNIQUE: Upright view of the abdomen, 1 image(s) FINDINGS: Weighted esophagogastric tube is unchanged in position with distal tip overlying the second portion of the duodenum. No dilated bowel loops in the visualized upper abdomen. No acute osseous abnormalities. Lung bases are clear.     Similar positioning of the weighted esophagogastric tube with tip overlying the second portion of the duodenum.     XR Knee 1 or 2 Views Bilateral    Result Date: 11/12/2022  EXAM: XR KNEE 1 OR 2 VIEWS BILATERAL DATE: 11/12/2022 2:39 PM ACCESSION: [id number] UN DICTATED: 11/12/2022 3:10 PM INTERPRETATION LOCATION: [facility] CLINICAL INDICATION: 22 years old Female with pain and swelling with standing    COMPARISON: None provided. TECHNIQUE: AP and crosstable lateral views of the knees. FINDINGS: No acute fractures. Normal joint alignment. Preserved joint spaces. Normal soft tissues. Normal radiographs of the knees     CT Lumbar Spine W Contrast    Result Date: 11/11/2022  EXAM: Computed tomography, lumbar spine with contrast material. DATE: ACCESSION: [id number] UN DICTATED: 11/11/2022 9:31 AM INTERPRETATION LOCATION: [facility] CLINICAL INDICATION: 23 years old Female with Severe low back pain, recent bloodstream infection  COMPARISON: Concurrent CT pelvis, CT abdomen/pelvis 10/21/2022 TECHNIQUE: Axial CT images through the lumbar spine with contrast. Coronal and sagittal reformatted images, bone and soft tissue algorithm are provided. FINDINGS:  There is no fracture. The vertebrae are normally aligned. Nonspecific mild stranding posterior to the paraspinal lumbar spine muscles. No abnormal contrast enhancement.     No acute abnormalities of the lumbar spine.     CT Pelvis W  Contrast    Result Date: 11/11/2022  EXAM: CT PELVIS W  CONTRAST ACCESSION: [id number] UN CLINICAL INDICATION: Severe low back and pelvic pain, complicated abdominal history with recent bloodstream infection.  COMPARISON: CT abdomen pelvis 10/21/2022 TECHNIQUE: A helical CT scan of the pelvis was obtained with IV contrast from the iliac crests through the pubic symphysis. Images were reconstructed in the axial plane. Coronal and sagittal reformatted images were also provided for further evaluation. FINDINGS: LINES AND TUBES: Tip of weighted enteric tube with associated streak artifact noted within the partially visualized duodenum. BLADDER:  Distended, but otherwise unremarkable in appearance. PELVIC/REPRODUCTIVE ORGANS: Uterus is present. GI TRACT: Sequelae of proctocolectomy with ileoanal anastomosis. Redemonstration of moderate dilatation at the proximal aspect of the ileoanal anastomosis, which is less prominent compared to the prior study. PERITONEUM/RETROPERITONEUM AND MESENTERY: Redemonstration of ill-defined soft tissue within the lower central mesentery measuring approximately 2.9 x 2.7 cm (505:25), with associated tethering to loops of adjacent small bowel. LYMPH NODES: No enlarged lymph nodes. VESSELS: The aorta is normal in caliber.  BONES AND SOFT TISSUES: Postsurgical changes of the right anterior abdominal wall. Masslike thickening of the right inferior rectus abdominis measuring approximately 3.0 x 1.9 x 3.8 cm (505:64, 507:64). Additional smaller hyperattenuating foci along the lateral margin of the right rectus (505:42, 46). Tiny fat-containing umbilical hernia.     Sequelae of proctocolectomy with ileoanal anastomosis with redemonstrated moderate dilatation of the proximal aspect of the ileoanal anastomosis, which appears less prominent compared to the prior study. 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 Redemonstrated ill-defined soft tissue mass within the lower central mesentery with associated tethering to adjacent small bowel loops, compatible with known desmoid tumor. Distended urinary bladder, which is otherwise unremarkable in appearance. Correlate clinically for signs of urinary retention.    CT Lower Extremity Bilateral W Contrast    Result Date: 11/11/2022  EXAM: CT LOWER EXTREMITY BILATERAL W CONTRAST DATE: 11/11/2022 9:41 AM ACCESSION: [id number] UN DICTATED: 11/11/2022 9:49 AM INTERPRETATION LOCATION: MAIN CAMPUS CLINICAL INDICATION: 22 years old Female with Severe low back and hip pain of unclear etiology, recent bloodstream infection  COMPARISON: None. TECHNIQUE: An axially-acquired helical CT scan of the bilateral proximal thighs was obtained after administration of IV contrast. Contiguous transverse, coronal and sagittal images were reconstructed at 3-mm increments. FINDINGS: Small fluid attenuation collections along the proximal right thigh subcutaneous tissues. For reference, proximal anterior lateral thigh measuring 1.5 x 1.0 cm (provide 2:187) and lateral proximal to mid right thigh, measuring 1.3 x 1.3 cm (502:206). No significant adjacent fat stranding. No acute fracture. Normal alignment of the bilateral hips. No asymmetric widening of the sacroiliac joints. Joint spaces are preserved. No CT evidence of joint effusion. Please see separately dictated pelvis CT regarding findings in the partially imaged pelvis.     No significant bilateral hip joint effusions or sacroiliac joint effusions. No osseous destruction to suggest any CT evidence of infection involving bilateral hip joints or sacroiliac joints. Small localized well-circumscribed fluid collections along the anterolateral proximal right thigh as detailed in the body of the report without significant adjacent fat stranding. These findings are nonspecific, possibly reflecting nonspecific subcutaneous cyst, differential includes, hematoma, versus less likely seroma. Superadded infection is difficult to exclude. No fluid collections suggestive of abscesses.    ECG 12 Lead    Result Date: 11/02/2022  NORMAL SINUS RHYTHM WITHIN NORMAL LIMITS WHEN COMPARED WITH ECG OF 21-Oct-2022 14:20, VENT. RATE HAS DECREASED by  54 bpm Confirmed by Schuyler Amor [phone]) on 11/02/2022 3:53:07 PM    EMG Study    Result Date: 10/30/2022  Gasper Sells, MD     10/30/2022  7:22 PM   [facility] Clinical Neurophysiology Laboratory 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 Sensory NCS   Nerve / Sites Rec. Site Peak Lat Ref. PP Amp Ref. Distance Vel.   ms ms ??V ??V cm m/s L Median - Dig II (Antidromic)    Wrist Index 2.65 ?3.80 88.2 ?10.0 14 71 L Ulnar - Dig V (Antidromic)    Wrist Dig V 2.75 ?3.80 57.6 ?10.0 14 65 L Radial - Superficial (Antidromic)    Forearm Wrist 2.17 ?2.80 52.2  10 62 L Sural - (Antidromic)    Calf Ankle 4.02 ?4.20 25.1 ?5.0 14 43 L SUP FIBULAR (PERON) - Ankle    Lat leg Ankle 3.17 ?3.40 12.3 ?5.0 10 39   Motor NCS   Nerve / Sites Muscle Latency Ref. Amplitude Ref. Dur. Distance Lat Diff Velocity Ref.   ms ms mV mV ms cm ms m/s m/s L Median - APB    Wrist APB 2.69 ?4.40 11.2 ?4.2 6.29 8       Elbow APB 6.65  11.0  6.42 21 3.96 53.1 ?49.0 L Ulnar - ADM    Wrist ADM 2.58 ?3.70 7.9 ?5.6 5.92 8       B.Elbow ADM 5.67  7.0  6.00 17 3.08 55.1 ?49.0    A.Elbow ADM 7.48  6.1  6.75 10 1.81 55.2 ?49.0 L Ulnar - ADM - Pincus Badder    Wrist ADM 2.00 ?18.10 7.8 ?5.6 5.56 80       Post Ex. - 10 Sec ADM 2.00  8.6  5.38  0.00  ?49.0 L COMMON FIBULAR (PERON) - EDB    Ankle EDB 3.54 ?5.47 10.7 ?2.2 5.48 7.5       B. Fib Head EDB 9.58  10.1  5.90 30 6.04 49.7 ?41.0    A. Fib Head EDB 11.40  10.0  6.17 10 1.81 55.2 ?41.0 L Tibial - AH    Ankle AH 3.77 ?5.58 18.1 ?2.8 5.54 7.5       Knee AH 12.10  15.3  6.13 38 8.33 45.6 ?41.0   F  Wave   Nerve F min Ref.  ms ms L COMMON FIBULAR (PERON) - EDB 44.0 ?56.0 L Tibial - AH 47.0 ?56.0 L Median - APB 24.9 ?31.0 L Ulnar - ADM 26.5 ?31.0   Repetitive Nerve Stimulation (RNS)   Anatomy / Train Rate Amplitude ? Amp 4-1 Fac Ampl Area ?Area 4-1 Fac Area  Hz mV % % mVms % % L Ulnar - ADM Baseline 2 9.3 -1 100 27.0 -4.3 100 Post 1 min Exercise 2 7.9 2.3 84.7 24.5 -7.2 90.6   EMG Summary Table   Spontaneous MUP Recruitment Comment Muscle Nerve Roots Fib PSW Fasc Other Amp Dur. PPP Pattern Other R. Tibialis anterior Deep peroneal (Fibular) L4-L5   None None N N N Normal None R. Gastrocnemius (Medial head) Tibial S1-S2 None None None None N N N Normal None L. Vastus medialis Femoral L2-L4 None None None None N N N Normal None L. Iliopsoas Femoral L2-L3 None None None None N N N Normal None L. Gastrocnemius (Lateral head) Tibial S1-S2 None None None None N N N Normal None   Summary: After identifying the patient in the waiting room and reviewing all appropriately available medical records, the patient was taken back to  the examination room where the procedure was explained, the sites of examination were noted, the patient's questions were answered, and the patient's verbal consent for the procedure was obtained. Studies were performed at [facility] using a radiant warmer and Synergy EMG system. Sensory nerve conduction study in the left arm and left leg is normal. Extensive motor nerve conduction study in the left arm and left leg is also normal with preserved amplitudes.  Lambert-Eaton screen with 10 seconds of exercise did not produce a significant incremental in response.  Repetitive nerve stimulation study at baseline and after exercise was also normal. Needle EMG is normal. Conclusions: This is a normal study.  - - - - - - - - - - - - -   Resident or Fellow As the attending physician, my signature affirms that I personally participated in the clinical assessment of this patient, performed or reviewed all electrodiagnostic procedures, and prepared or reviewed the conclusions of this report. - - - - - - - - - - - -  Attending Electromyographer Gasper Sells, MD Carmon Ginsberg. 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 Sensory NCS   Nerve / Sites Rec. Site Peak Lat Ref. PP Amp Ref. Distance Vel.   ms ms ??V ??V cm m/s L Median - Dig II (Antidromic)    Wrist Index 2.65 ?3.80 88.2 ?10.0 14 71 L Ulnar - Dig V (Antidromic)    Wrist Dig V 2.75 ?3.80 57.6 ?10.0 14 65 L Radial - Superficial (Antidromic)    Forearm Wrist 2.17 ?2.80 52.2  10 62 L Sural - (Antidromic)    Calf Ankle 4.02 ?4.20 25.1 ?5.0 14 43 L SUP FIBULAR (PERON) - Ankle    Lat leg Ankle 3.17 ?3.40 12.3 ?5.0 10 39   Motor NCS   Nerve / Sites Muscle Latency Ref. Amplitude Ref. Dur. Distance Lat Diff Velocity Ref.   ms ms mV mV ms cm ms m/s m/s L Median - APB    Wrist APB 2.69 ?4.40 11.2 ?4.2 6.29 8       Elbow APB 6.65  11.0  6.42 21 3.96 53.1 ?49.0 L Ulnar - ADM    Wrist ADM 2.58 ?3.70 7.9 ?5.6 5.92 8       B.Elbow ADM 5.67  7.0  6.00 17 3.08 55.1 ?49.0    A.Elbow ADM 7.48  6.1  6.75 10 1.81 55.2 ?49.0 L Ulnar - ADM - Pincus Badder    Wrist ADM 2.00 ?18.10 7.8 ?5.6 5.56 80       Post Ex. - 10 Sec ADM 2.00  8.6  5.38  0.00  ?49.0 L COMMON FIBULAR (PERON) - EDB    Ankle EDB 3.54 ?5.47 10.7 ?2.2 5.48 7.5  B. Fib Head EDB 9.58  10.1  5.90 30 6.04 49.7 ?41.0    A. Fib Head EDB 11.40  10.0  6.17 10 1.81 55.2 ?41.0 L Tibial - AH    Ankle AH 3.77 ?5.58 18.1 ?2.8 5.54 7.5       Knee AH 12.10  15.3  6.13 38 8.33 45.6 ?41.0   F  Wave   Nerve F min Ref.  ms ms L COMMON FIBULAR (PERON) - EDB 44.0 ?56.0 L Tibial - AH 47.0 ?56.0 L Median - APB 24.9 ?31.0 L Ulnar - ADM 26.5 ?31.0   Repetitive Nerve Stimulation (RNS)   Anatomy / Train Rate Amplitude ? Amp 4-1 Fac Ampl Area ?Area 4-1 Fac Area  Hz mV % % mVms % % L Ulnar - ADM Baseline 2 9.3 -1 100 27.0 -4.3 100 Post 1 min Exercise 2 7.9 2.3 84.7 24.5 -7.2 90.6   EMG Summary Table   Spontaneous MUP Recruitment Comment Muscle Nerve Roots Fib PSW Fasc Other Amp Dur. PPP Pattern Other R. Tibialis anterior Deep peroneal (Fibular) L4-L5   None None N N N Normal None R. Gastrocnemius (Medial head) Tibial S1-S2 None None None None N N N Normal None L. Vastus medialis Femoral L2-L4 None None None None N N N Normal None L. Iliopsoas Femoral L2-L3 None None None None N N N Normal None L. Gastrocnemius (Lateral head) Tibial S1-S2 None None None None N N N Normal None   Summary: After identifying the patient in the waiting room and reviewing all appropriately available medical records, the patient was taken back to the examination room where the procedure was explained, the sites of examination were noted, the patient's questions were answered, and the patient's verbal consent for the procedure was obtained. Studies were performed at [facility] using a radiant warmer and Synergy EMG system. Sensory nerve conduction study in the left arm and left leg is normal. Extensive motor nerve conduction study in the left arm and left leg is also normal with preserved amplitudes.  Lambert-Eaton screen with 10 seconds of exercise did not produce a significant incremental in response.  Repetitive nerve stimulation study at baseline and after exercise was also normal. Needle EMG is normal. Conclusions: This is a normal study.  - - - - - - - - - - - - -   Resident or Fellow As the attending physician, my signature affirms that I personally participated in the clinical assessment of this patient, performed or reviewed all electrodiagnostic procedures, and prepared or reviewed the conclusions of this report. - - - - - - - - - - - -  Attending Electromyographer Gasper Sells, MD Carmon Ginsberg. Distinguished Professor Stage manager, Division of Neuromuscular Medicine      XR Abdomen 1 View    Result Date: 10/29/2022  EXAM: XR ABDOMEN 1 VIEW ACCESSION: [id number] UN CLINICAL INDICATION: 22 years old with NGT (CATHETER VASCULAR FIT & ADJ)  COMPARISON: CT abdomen/pelvis 10/21/2022, abdominal radiograph 10/20/2018 TECHNIQUE: Supine view of the abdomen, 1 image(s) FINDINGS: Weighted enteric tube with distal tip projecting over the second portion of the duodenum. Suture material overlies the right lower quadrant. Gaseous distention of the stomach. Partially imaged nonobstructive bowel gas pattern. Partially imaged mild degenerative changes of the lower lumbar spine. No acute osseous abnormality. Soft tissues are unremarkable. Lung bases are clear.     Weighted enteric tube with distal tip projecting over the second portion of the duodenum.     Echocardiogram Transesophageal Echo  Result Date: 10/28/2022  Patient Info Name:     [name] [name] Age:     21 years DOB:     03/24/2000 Gender:     Female MRN:     [id number] Accession #:     [id number] UN Account #:     [id number] Exam Date:     10/28/2022 4:09 PM Admit Date:     09/25/2022 Exam Type:     ECHOCARDIOGRAM TRANSESOPHAGEAL ECHO Technical Quality:     Fair Staff Sonographer:     Lavonda Jumbo Referring Physician:     Worthy Flank Reading Fellow:     Virgel Bouquet Ordering Physician:     Suanne Marker Study Info Indications      - TV/RA vegetation Procedure(s)   Complete two-dimensional, color flow and Doppler transesophageal study is performed. Three dimensional echocardiographic imaging is performed during the transesophageal echocardiogram. Summary   1. TEE to evaluate for endocarditis.   2. Prominent eustachian valve and chiari network visualized within right atrium, infectious etiology is felt to be less likely.   3. No other apparent valvular vegetations.   4. Catheter tip noted in SVC without associated vegetation. Anesthesia/Analgesia   The patient received general anesthesia. Sedation was performed by anesthesiologist. Procedure Details   The procedure was explained and the consent form was signed. The patient arrived in a fasting state. The patient tolerated the procedure well and there were no complications. There was no difficulty inserting the probe. Number of insertion attempts: 1. I was present and supervised the entire procedure, including insertion of the transesophageal probe. Right Atrium   Catheter tip noted in SVC without associated vegetation. A prominent eustachian valve is noted. Aortic Valve   The aortic valve is trileaflet with normal appearing leaflets with normal excursion. There is no significant aortic regurgitation. Pulmonic Valve   The pulmonic valve is poorly visualized, but probably normal. There is no significant pulmonic regurgitation. Mitral Valve   The mitral valve leaflets are normal with normal leaflet mobility. There is trivial mitral valve regurgitation. Tricuspid Valve   The tricuspid valve leaflets are normal, with normal leaflet mobility. There is trivial tricuspid regurgitation. Pericardium/Pleural   There is no pericardial effusion. Other Findings   Rhythm: Sinus Rhythm. Report Signatures Finalized by Ofilia Neas  MD on 10/28/2022 08:24 PM Resident Virgel Bouquet on 10/28/2022 05:27 PM    MRI Brain W Wo Contrast    Result Date: 10/24/2022  EXAM: Magnetic resonance imaging, brain, without and with contrast material. ACCESSION: [id number] UN CLINICAL INDICATION: 22 years old Female with Increasing rigidity/tone and weakness, rule out central process  COMPARISON: MRI brain 09/17/2016 TECHNIQUE: Multiplanar, multisequence MR imaging of the brain was performed without and with I.V. contrast. FINDINGS:  There is no focal parenchymal signal abnormality. Ventricles are normal in size. There is no midline shift. No extra-axial fluid collection. No evidence of intracranial hemorrhage. No diffusion weighted signal abnormality to suggest acute infarct. Developmental venous anomaly in the right frontal centrum semiovale. No mass. There is no abnormal intracranial enhancement. No abnormal enhancement in the left parotid gland on today's exam.     --Normal MRI of the brain.     Echocardiogram Follow Up/Limited Echo    Result Date: 10/24/2022  Patient Info Name:     [name] [name] Age:     21 years DOB:     December 18, 2000 Gender:     Female MRN:     [id number] Accession #:  [id number] UN Account #:     [id number] Ht:     165 cm Wt:     60 kg BSA:     1.66 m2 BP:     129 /     62 mmHg Exam Date:     10/23/2022 3:47 PM Admit Date:     09/25/2022 Exam Type:     ECHOCARDIOGRAM FOLLOW UP/LIMITED ECHO Technical Quality:     Poor Reason for Poor Study:     poor echocardiographic windows Staff Sonographer:     Ezequiel Kayser Referring Physician:     Miles Costain Ossman Study Info Indications      - Fungemia Procedure(s)   Limited 2D, color flow and Doppler transthoracic echocardiogram is performed. Summary   1. Technically difficult study.   2. The left ventricular systolic function is normal, LVEF is visually estimated at 55-60%.   3. Possible mobile echodensity noted on the mitral valve; concern for possible vegetation.   4. The right ventricle is normal in size, with normal systolic function.   5. There is a mobile echodensity in the right atrium. Cannot rule out vegetation on tricuspid valve vs indwelling catheter. Recommendations   * Recommend follow-up transesophageal echocardiogram if clinical concern for endocarditis. Left Ventricle   The left ventricle is normal in size with normal wall thickness. The left ventricular systolic function is normal, LVEF is visually estimated at 55-60%. Right Ventricle   The right ventricle is normal in size, with normal systolic function. Right Atrium   There is a mobile echodensity in the right atrium. Cannot rule out vegetation on tricuspid valve vs indwelling catheter. Aortic Valve   The aortic valve is trileaflet with normal appearing leaflets with normal excursion. There is no significant aortic regurgitation. Mitral Valve   The mitral valve leaflets are normal with normal leaflet mobility. Possible mobile echodensity noted on the mitral valve; concern for possible vegetation. There is no significant mitral valve regurgitation. Tricuspid Valve   The tricuspid valve leaflets are normal, with normal leaflet mobility. There is trivial tricuspid regurgitation. The pulmonary systolic pressure cannot be estimated due to insufficient TR signal. There is insufficient TR signal pulmonary hypertension. TR maximum velocity: 1.5 m/s. Pulmonic Valve   Pulmonary valve is not well visualized. There is trivial pulmonic regurgitation. There is no evidence of a significant transvalvular gradient. Pericardium/Pleural   There is no pericardial effusion. Other Findings   Rhythm: Sinus Rhythm. Ventricles ---------------------------------------------------------------------- Name                                 Value        Normal ---------------------------------------------------------------------- LV Dimensions 2D/MM ----------------------------------------------------------------------  IVS Diastolic Thickness (2D)                                0.8 cm       0.6-0.9 LVID Diastole (2D)                  4.1 cm       3.8-5.2  LVPW Diastolic Thickness (2D)                                0.8 cm       0.6-0.9 LVID Systole (2D)  3.2 cm       2.2-3.5 LV Mass Index (2D Cubed)           58 g/m2         43-95  Relative Wall Thickness (2D)                                  0.39 Tricuspid Valve ---------------------------------------------------------------------- Name                                 Value        Normal ---------------------------------------------------------------------- TV Regurgitation Doppler ---------------------------------------------------------------------- TR Peak Velocity                   1.5 m/s Report Signatures Finalized by Rudene Anda  MD on 10/24/2022 11:23 AM Preliminary amended by Suanne Marker on 10/24/2022 08:23 AM Resident Eustace Moore Essig on 10/23/2022 05:04 PM    US Liver Doppler    Result Date: 10/23/2022  EXAM: US LIVER DOPPLER ACCESSION: [id number] UN CLINICAL INDICATION: 22 years old with Abnormal LFTs, GNR/yeast growing on blood cultures, better characterize small lesions noted on CT  COMPARISON: CT abdomen/pelvis 10/21/2022 TECHNIQUE: Ultrasound views of the liver vasculature were obtained using grayscale, color Doppler, and spectral Doppler analysis FINDINGS: Evaluation limited by lack of patient mobility and patient discomfort during examination. PORTAL VEIN: The main, left and right portal veins were patent with hepatopetal flow. Normal main portal vein velocity (0.20 m/s or greater)      Main portal vein diameter:   1.0  cm      Main portal vein velocity: 0.34 m/s      Anterior right portal vein velocity:   0.46  m/s      Posterior right portal vein velocity:  0.23   m/s      Left portal vein velocity:   0.14  m/s      Main portal vein flow: hepatopetal      Right portal vein flow: hepatopetal      Left portal vein flow: hepatopetal SPLENIC VEIN: Patent, with hepatopetal flow.      Splenic vein midline: hepatopetal      Splenic vein proximal: hepatopetal HEPATIC VEINS/IVC: The IVC, left, middle and right hepatic veins were were patent.      Left hepatic vein phasicity/flow: triphasic      Middle hepatic vein phasicity/flow: bi-tri      Right hepatic vein phasicity/flow: bi-tri      Inferior vena cava phasicity/flow: mono-bi HEPATIC ARTERY: Limited evaluation of the common hepatic artery. It appears patent on Doppler imaging, although with turbulent flow, but this is likely artifactual. OTHER: No ascites. Partially imaged layering echogenic debris in the gallbladder, corresponding to biliary sludge demonstrated on CT abdomen/pelvis 10/21/2022.     Patent hepatic vasculature with normal flow although with sluggish velocities in the right posterior and left portal veins.     IR Remove Tunneled Catheter    Result Date: 10/23/2022  PROCEDURE: Tunneled central venous catheter removal ACCESSION: [id number] UN Procedural Personnel Attending physician(s): Dr. Claretta Fraise Resident physician(s): Dr. Leone Payor Advanced practice provider(s): None Pre-procedure diagnosis: Bacteremia Post-procedure diagnosis: Same Indication: Bacteremia or sepsis of unknown source Additional clinical history: None _______________________________________________________________ PROCEDURE SUMMARY: - Tunneled central venous catheter removal - Additional procedure(s): None PROCEDURE DETAILS: Pre-procedure Consent: Informed consent for the procedure including risks, benefits and alternatives  was obtained and time-out was performed prior to the procedure. Preparation: The site was prepared and draped using maximal sterile barrier technique including cutaneous antisepsis. Anesthesia/sedation Level of anesthesia/sedation: No sedation Anesthesia/sedation administered by: Not applicable Catheter removal Local anesthesia was administered. The catheter was removed with a combination of traction and blunt dissection. Fluoroscopic images were not obtained to document removal. Closure Hemostasis was achieved with manual compression. Sterile dressing(s) applied. Contrast Contrast agent: Omnipaque 300 Contrast volume (mL): 0 Radiation Dose Fluoroscopy time (minutes): 0  Reference Air Kerma (mGy): 0 Additional Details Additional description of procedure: None Registry event: Z/3/f Device used: None Equipment details: None Specimens removed: Tunneled central venous catheter. Estimated blood loss (mL): Less than 10 Standardized report: [email or web address] Attestation Signer name: Claretta Fraise I attest that I supervised the procedure and was immediately available. I reviewed the stored images and agree with the report as written. _______________________________________________________________ Complications: No immediate complications.     Removal of right-sided tunneled powerline catheter in the internal jugular vein. Plan: Please re-consult interventional radiology if new catheter placement is desired.     XR Chest Portable    Result Date: 10/22/2022  EXAM: XR CHEST PORTABLE ACCESSION: [id number] UN CLINICAL INDICATION: CATHETER VASCULAR FIT&ADJ  TECHNIQUE: Single View AP Chest Radiograph. COMPARISON: 10/13/2022 FINDINGS: Right internal jugular venous catheter with tip terminating at the superior cavoatrial junction. Enteric tube enters the stomach. Lungs are clear.  No pleural effusion or pneumothorax. Normal heart size and mediastinal contours.     New right internal jugular venous catheter with tip terminating at the superior cavoatrial junction    ECG 12 Lead    Result Date: 10/22/2022  SINUS TACHYCARDIA CANNOT RULE OUT ANTERIOR INFARCT  , AGE UNDETERMINED ABNORMAL ECG WHEN COMPARED WITH ECG OF 19-Oct-2022 11:22, BASELINE ARTIFACT ON PREVIOUS ECG PREVENTS ACCURATE COMPARISON HEART RATE HAS INCREASED Confirmed by Warnell Forester ([id number]) on 10/22/2022 12:10:43 PM    CT Abdomen Pelvis W Contrast    Result Date: 10/21/2022  EXAM: CT ABDOMEN PELVIS W CONTRAST ACCESSION: [id number] UN CLINICAL INDICATION: 22 years old with Abdominal pain, fevers; complicated PMHx, hx obstruction/partial obstruction, desmoid tumors  COMPARISON: CT abdomen pelvis 09/27/2022 TECHNIQUE: A helical CT scan of the abdomen and pelvis was obtained following IV contrast from the lung bases through the pubic symphysis. Images were reconstructed in the axial plane. Coronal and sagittal reformatted images were also provided for further evaluation. FINDINGS: LOWER CHEST: No focal consolidation, pleural effusion, or pneumothorax. LIVER: Normal liver contour. Subcentimeter hypoattenuating hepatic lesions, too small to characterize on CT. BILIARY: Layering sludge versus stones without gallbladder wall thickening. There is a small amount of pericholecystic fluid/right upper quadrant fluid which is similar to prior. No biliary ductal dilatation.  SPLEEN: Normal in size and contour. PANCREAS: Normal pancreatic contour.  No focal lesions.  No ductal dilation. ADRENAL GLANDS: Normal appearance of the adrenal glands. KIDNEYS/URETERS: Symmetric renal enhancement.  No hydronephrosis. Unchanged size of 1.1 cm intermediate attenuation left renal lesion (2:46). BLADDER: Mild circumferential bladder wall thickening which may be exaggerated underdistention. REPRODUCTIVE ORGANS: Anteverted uterus. 3.7 cm left adnexal low attenuation lesion, consistent with an ovarian follicle/cyst, too small to require follow-up. GI TRACT: Esophogastric tube tip in the distal stomach. Proctocolectomy with ileoanal anastomosis. There is oral contrast within the stomach, and mid and distal small bowel to the level of the anastomosis. Mild dilation of small bowel measuring up to 3.7 cm in diameter. There are regions of bowel tethering and multifocal narrowing associated with mesenteric mass described below. No  discrete single transition point. Additionally, there is persistent dilation of distal ileum proximal to the ileoanal anastomosis (4:70). No bowel wall thickening. PERITONEUM, RETROPERITONEUM AND MESENTERY: No free air.  No ascites.  No fluid collection. Similar size of ill-defined mesenteric mass centered in the lower central mesentery measuring approximately 2.8 x 2.5 cm (2:94) with associated tethering of bowel. LYMPH NODES: No adenopathy by size criteria. Similar multiple mildly prominent mesenteric nodes measuring up to 0.6 cm short axis. VESSELS: Hepatic and portal veins are patent.  Normal caliber aorta.  BONES and SOFT TISSUES: No aggressive osseous lesions.  No focal soft tissue lesions.     -Proctocolectomy with ileoanal anastomosis. Mildly dilated small bowel loops with intervening loops of tethered and narrowed small bowel associated with the known desmoid tumor. Contrast visualized in the distal ileum, ruling out complete obstruction. -Dilation of distal ileum just proximal to the ileoanal anastomosis, similar to multiple priors, nonspecific. Recommend correlation for anastomotic stricture. -Circumferential bladder wall thickening which may be exaggerated by underdistention. Recommend correlation with clinical symptoms and/or urinalysis for acute infection.     XR Abdomen Portable    Result Date: 10/20/2022  EXAM: XR ABDOMEN PORTABLE ACCESSION: [id number] UN CLINICAL INDICATION: 22 years old with ABDOMINAL PAIN  -  UNSPECIFIED SITE  COMPARISON: 10/06/2022 abdominal radiograph TECHNIQUE: Supine view of the abdomen, 2 image(s) FINDINGS: The lung bases appear unremarkable. An esophagogastric tube with tip and sidehole overlying the stomach is identified. The tip overlies the distal stomach. Unchanged mild to moderately dilated loops of gas containing small bowel identified overlying the lower abdomen and upper pelvis similar to priors. Sequela of J-pouch creation in the pelvis. Otherwise the remaining small bowel loops are nondilated. Prominently distended bladder is identified. No evidence of acute osseous abnormalities.     An esophagogastric tube with tip and sidehole overlying the stomach is identified. The tip overlies the distal stomach. Unchanged mild to moderately dilated loops of gas containing small bowel are identified overlying the lower abdomen and upper pelvis similar to priors. This finding is not specific and could also be associated with postsurgical changes and/or paralytic ileus. Correlation with clinical findings recommended. Sequela of J-pouch creation in the pelvis. Otherwise the remaining small bowel loops are nondilated. Prominently distended bladder is identified.        ECG 12 Lead    Result Date: 10/19/2022  POOR DATA QUALITY POOR DATA QUALITY IN CURRENT ECG PRECLUDES SERIAL COMPARISON Confirmed by Christella Noa [phone]) on 10/19/2022 4:39:47 PM    ECG 12 Lead    Result Date: 10/18/2022  NORMAL SINUS RHYTHM NORMAL ECG WHEN COMPARED WITH ECG OF 14-Oct-2022 10:59, NO SIGNIFICANT CHANGE WAS FOUND Confirmed by Mariane Baumgarten ([id number]) on 10/18/2022 9:00:21 AM    Echocardiogram Follow Up/Limited Echo    Result Date: 10/17/2022  Patient Info Name:     [name] [name] Age:     21 years DOB:     [birth year] Gender:     Female MRN:     [id number] Accession #:     [id number] UN Account #:     [id number] Ht:     165 cm Wt:     60 kg BSA:     1.66 m2 BP:     124 /     57 mmHg HR:     93 bpm Exam Date:     10/17/2022 8:30 AM Admit Date:     09/25/2022 Exam Type:     ECHOCARDIOGRAM FOLLOW UP/LIMITED ECHO Technical Quality:  Fair Staff Sonographer:     Margarita Grizzle Referring Physician:     Worthy Flank Reading Fellow:     Damita Dunnings MD Study Info Indications      - Recurrent syncope Procedure(s)   Limited 2D, color flow and Doppler transthoracic echocardiogram is performed. Summary   1. The left ventricle is normal in size with normal wall thickness.   2. The left ventricular systolic function is normal, LVEF is visually estimated at > 55%.   3. The right ventricle is not well visualized but probably normal in size, with probably normal systolic function.   4. There are no significant valvular abnormalities. Left Ventricle   The left ventricle is normal in size with normal wall thickness. The left ventricular systolic function is normal, LVEF is visually estimated at > 55%. There is normal left ventricular diastolic function. Right Ventricle   The right ventricle is not well visualized but probably normal in size, with probably normal systolic function. Left Atrium   The left atrium is normal in size. Aortic Valve   The aortic valve is poorly visualized with probably normal excursion with normal appearing leaflets with normal excursion. There is no significant aortic regurgitation. Mitral Valve   The mitral valve leaflets are normal with normal leaflet mobility. There is no significant mitral valve regurgitation. Tricuspid Valve The tricuspid valve leaflets are normal, with normal leaflet mobility. There is trivial tricuspid regurgitation. Aorta   The aorta is normal in size in the visualized segments. Pericardium/Pleural   There is a trivial pericardial effusion. Ventricles ---------------------------------------------------------------------- Name                                 Value        Normal ---------------------------------------------------------------------- LV Dimensions 2D/MM ----------------------------------------------------------------------  IVS Diastolic Thickness (2D)                                0.7 cm       0.6-0.9 LVID Diastole (2D)                  4.2 cm       3.8-5.2  LVPW Diastolic Thickness (2D)                                0.7 cm       0.6-0.9 LVID Systole (2D)                   2.8 cm       2.2-3.5 LV Mass Index (2D Cubed)           51 g/m2         43-95  Relative Wall Thickness (2D)                                  0.33 Atria ---------------------------------------------------------------------- Name                                 Value        Normal ---------------------------------------------------------------------- LA Dimensions ---------------------------------------------------------------------- LA Dimension (2D)  2.2 cm       2.7-3.8 Mitral Valve ---------------------------------------------------------------------- Name                                 Value        Normal ---------------------------------------------------------------------- MV Diastolic Function ---------------------------------------------------------------------- MV E Peak Velocity                 87 cm/s               MV A Peak Velocity                 87 cm/s               MV E/A                                 1.0               MV Annular TDI ---------------------------------------------------------------------- MV Septal e' Velocity             7.8 cm/s         >=8.0 MV E/e' (Septal) 11.2               MV Lateral e' Velocity           14.6 cm/s        >=10.0 MV E/e' (Lateral)                      6.0               MV e' Average                    11.2 cm/s               MV E/e' (Average)                      8.6 Tricuspid Valve ---------------------------------------------------------------------- Name                                 Value        Normal ---------------------------------------------------------------------- TV Regurgitation Doppler ---------------------------------------------------------------------- TR Peak Velocity                   2.1 m/s Aorta ---------------------------------------------------------------------- Name                                 Value        Normal ---------------------------------------------------------------------- Ascending Aorta ---------------------------------------------------------------------- Ao Root Diameter (2D)               2.6 cm               Ao Root Diam Index (2D)          1.6 cm/m2 Report Signatures Finalized by Madaline Savage  MD on 10/17/2022 12:16 PM Resident Damita Dunnings  MD on 10/17/2022 09:29 AM    ECG 12 Lead    Result Date: 10/14/2022  NORMAL SINUS RHYTHM CANNOT RULE OUT ANTERIOR INFARCT  (CITED ON OR BEFORE 29-Sep-2022) ABNORMAL ECG WHEN COMPARED WITH ECG OF 29-Sep-2022 16:49, PREMATURE ATRIAL BEATS ARE NO LONGER PRESENT Confirmed by Warnell Forester ([id number]) on 10/14/2022 4:34:05  PM    XR Chest Portable    Result Date: 10/13/2022  EXAM: XR CHEST PORTABLE ACCESSION: [id number] UN CLINICAL INDICATION: Cough. TECHNIQUE: Single View AP Chest Radiograph. COMPARISON: July 19. FINDINGS: Enteric tube in the stomach with central line tip cavoatrial junction. The lungs remain clear with no pleural fluid or pneumothorax.     New enteric tube in the stomach. No other change.     XR Forearm Right    Result Date: 10/08/2022  EXAM: XR FOREARM RIGHT DATE: 10/07/2022 8:33 PM ACCESSION: [id number] UN DICTATED: 10/07/2022 8:46 PM INTERPRETATION LOCATION: [facility] CLINICAL INDICATION: 22 years old Female with Right wrist and elbow pain    COMPARISON: None. TECHNIQUE: AP and lateral views of the right forearm. FINDINGS: No acute fracture identified. Normal anatomic alignment. Joint spaces maintained. No focal soft tissue swelling.     No acute osseous abnormality.    XR Abdomen 1 View    Result Date: 10/06/2022  EXAM: XR ABDOMEN 1 VIEW ACCESSION: [id number] UN CLINICAL INDICATION: 22 years old with PARALYTIC ILEUS  COMPARISON: Abdominal radiograph 10/03/2022 TECHNIQUE: Supine view of the abdomen, 2 image(s) FINDINGS: Esophagogastric tube with side port and distal tip overlying the stomach. Right upper quadrant surgical clips. Pelvic suture material. Persistent gas-filled loops of bowel within the central lower abdomen/upper pelvis. Surgical sutures are seen in keeping with prior proctocolectomy and ileoanal anastomosis. No acute osseous abnormality. Lung bases are clear.     Persistent gas-filled loops of small bowel within the central mid-lower abdomen/pelvis which could reflect a degree of incomplete bowel obstruction possibly related to the adhesive changes possibly related to desmoid although not visible on plain radiography.     XR Abdomen 1 View    Result Date: 10/03/2022  EXAM: XR ABDOMEN 1 VIEW ACCESSION: [id number] UN CLINICAL INDICATION: 22 years old with PARALYTIC ILEUS  COMPARISON: Abdominal radiograph 10/01/2022 and prior TECHNIQUE: Supine view of the abdomen FINDINGS: The esophagogastric tube overlies the expected position of the stomach. Borderline dilatation of 2 loops of small bowel in the left upper quadrant. The lung bases are clear. No acute osseous abnormalities. Right upper quadrant surgical clips. Pelvic suture material.     Mild ileus with gas-filled loops of small bowel in the left upper quadrant. Mild gas-filled and barium loops of large bowel with distal colon     XR Abdomen 1 View    Addendum Date: 10/01/2022    ADDENDUM: This patient is s/p subtotal colectomy. Mildly dilated gas-filled small bowel loops are visualized with oral contrast again seen within the rectum.    Result Date: 10/01/2022  EXAM: XR ABDOMEN 1 VIEW ACCESSION: [id number] UN CLINICAL INDICATION: 22 years old with PARALYTIC ILEUS  COMPARISON: Multiple prior abdominal radiographs. TECHNIQUE: Supine view of the abdomen, 2 image(s) FINDINGS: Esophogastric tube is in expected position overlying the stomach. Distended loops of large bowel are filled with gas and enteric contrast, improved from prior. Otherwise, exam unchanged from prior.     - The esophogastric tube is in expected position overlying the stomach - Decreased distention of large bowel with retained contrast in the rectum     XR Abdomen 1 View    Result Date: 09/30/2022  EXAM: XR ABDOMEN 1 VIEW ACCESSION: [id number] UN CLINICAL INDICATION: 22 years old with PARALYTIC ILEUS  COMPARISON: Plain film dated September 29, 2022 TECHNIQUE: Portable supine abdomen was performed, 2 image(s) FINDINGS: Esophogastric tube is present with the tip and sideport overlying the gastric fundus/body. Mild-moderate contrast material/barium remains in the region of the rectum. Mild gas distention  of large bowel. No findings for small bowel dilation but limited without gas present. No free air. No radiopaque calcifications. Visualized lung bases are clear.     Esophagogastric tube is present with tip and sideport overlying the stomach. Persistent barium is seen in the region of the rectum     ECG 12 Lead    Result Date: 09/30/2022  SINUS RHYTHM WITH PREMATURE ATRIAL BEATS CANNOT RULE OUT ANTERIOR INFARCT  , AGE UNDETERMINED ABNORMAL ECG WHEN COMPARED WITH ECG OF 26-Sep-2022 10:28, PREMATURE ATRIAL BEATS ARE NOW PRESENT Confirmed by Warnell Forester ([id number]) on 09/30/2022 1:04:11 PM    XR Abdomen 1 View    Result Date: 09/29/2022  EXAM: XR ABDOMEN 1 VIEW ACCESSION: [id number] UN CLINICAL INDICATION: 22 years old with PARALYTIC ILEUS  COMPARISON: Abdominal radiograph 09/28/2022 TECHNIQUE: Supine view of the abdomen, 2 image(s) FINDINGS: Non-weighted esophagogastric tube courses below the diaphragm with distal tip and side hole overlying the stomach. Paucity of bowel gas limits characterization of overall bowel gas pattern. Stable prominence of mildly dilated multiple air-fluid dilated loops of bowel in the lower abdomen. Persistent retained contrast from recent CT abdomen pelvis overlies the region of the ileoanal anastomosis. Retained contrast in the bladder is no longer visualized. No acute bony abnormality.     Stable prominence of mildly dilated multiple air-fluid dilated loops of bowel in the lower abdomen.     CT Abdomen Pelvis W Contrast    Result Date: 09/28/2022  EXAM: CT ABDOMEN PELVIS W CONTRAST ACCESSION: [id number] UN CLINICAL INDICATION: 22 years old with rule out small bowel obstruction  COMPARISON: 09/26/2022 TECHNIQUE: A helical CT scan of the abdomen and pelvis was obtained following IV contrast from the lung bases through the pubic symphysis. Images were reconstructed in the axial plane. Coronal and sagittal reformatted images were also provided for further evaluation. FINDINGS: LOWER CHEST: Unremarkable. LIVER: Normal liver contour.  Subcentimeter hypoattenuating lesions, too small to characterize but stable from prior. BILIARY: Vicarious contrast excretion within the gallbladder which is otherwise normal in appearance. Mild prominence of the common bile duct, measuring up to 7mm, similar to prior. SPLEEN: Normal in size and contour. PANCREAS: Normal pancreatic contour.  No focal lesions.  No ductal dilation. ADRENAL GLANDS: Normal appearance of the adrenal glands. KIDNEYS/URETERS: Symmetric renal enhancement.  No hydronephrosis.  SImilar cyst in the left kidney. BLADDER: Unremarkable. REPRODUCTIVE ORGANS: Uterus is present. No suspicious adnexal lesion. GI TRACT: Sequela of proctocolectomy with ileoanal anastomosis. Enteric tube tip and sideport within the stomach. Positive enteric contrast opacifies multiple nondilated loops of small bowel primarily throughout the left abdomen. Multiple dilated air and fluid-containing small bowel loops in the abdomen without a discrete transition point. Similar fluid-density contents in the neorectum.  The appendix is surgically absent. PERITONEUM, RETROPERITONEUM AND MESENTERY: No free air.  Trace perihepatic free fluid along the inferior margin of the right hepatic lobe which is similar to prior.  No loculated fluid collection. Similar appearance of ill-defined lesion in the lower abdominal mesentery with surrounding inflammatory fat stranding (2:96). LYMPH NODES: No adenopathy. VESSELS: Hepatic and portal veins are patent.  Normal caliber aorta.  BONES and SOFT TISSUES: No aggressive osseous lesions. SImilar 2.6 cm right inferior rectus intramuscular mass (2:131). Similar stranding adjacent to the left femoral vessels. Similar bilateral gluteal subcutaneous nodules.     Similar-appearing bowel pattern with multiple dilated air and fluid containing bowel loops without a discrete transition point which may be indicative of ileus. Otherwise stable abdominopelvic findings compared to 09/26/2022.  XR Abdomen 1 View    Result Date: 09/28/2022  EXAM: XR ABDOMEN 1 VIEW ACCESSION: [id number] UN CLINICAL INDICATION: 22 years old with PARALYTIC ILEUS  COMPARISON: CT abdomen pelvis and abdominal radiographs dated 09/27/2022 and priors TECHNIQUE: Supine view of the abdomen, 2 image(s) FINDINGS: Similar positioning of the esophagogastric tube. Multiple air-filled dilated loops of bowel in the central abdomen appear similar to slightly decreased in caliber. Enteric contrast from the recent CT abdomen pelvis is now located within the region of the ileoanal anastomosis. Distended bladder with excreted contrast. No acute osseous abnormalities. Lung bases are clear.     Multiple air-filled dilated loops of bowel in the central abdomen appear similar to slightly decreased in caliber. Given that the enteric contrast is now located within the region of the ileoanal anastomosis, findings are favored to represent ileus.    XR Abdomen 1 View    Result Date: 09/27/2022  EXAM: XR ABDOMEN 1 VIEW ACCESSION: [id number] UN CLINICAL INDICATION: 23 years old with PARALYTIC ILEUS  COMPARISON: Multiple earlier same day abdominal radiographs TECHNIQUE: Supine view of the abdomen, 2 image(s) FINDINGS/IMPRESSION: Unchanged esophagogastric tube overlying the stomach. Persistently dilated air-filled loops of central small bowel, with a few loops slightly decreased in caliber. Visualized lung bases are clear. Examination is otherwise unchanged from earlier same day radiographs.     XR Abdomen 1 View    Result Date: 09/27/2022  EXAM: XR ABDOMEN 1 VIEW ACCESSION: [id number] UN CLINICAL INDICATION: 22 years old with PARALYTIC ILEUS  COMPARISON: Earlier same day abdominal radiograph TECHNIQUE: Semiupright view of the abdomen, 1 image(s) FINDINGS: Similar positioning and appearance of the esophagogastric tube. Increasing gaseous dilation of central small bowel loops measuring up to 5.0 cm. No acute osseous abnormalities.     Increasing gaseous dilation of central small bowel loops now measuring up to 5.0 cm.     XR Abdomen 1 View    Result Date: 09/27/2022  EXAM: XR ABDOMEN 1 VIEW ACCESSION: [id number] UN CLINICAL INDICATION: 22 years old with PARALYTIC ILEUS  COMPARISON: Earlier same day abdominal radiograph and priors TECHNIQUE: Supine view of the abdomen, 2 image(s) FINDINGS: Similar positioning and appearance of the esophagogastric tube. Similar gaseous distention of multiple loops of bowel with air to the level of the ileoanal anastomotic sutures. No acute osseous abnormalities. Lung bases are clear.     Similar positioning and appearance of the esophagogastric tube. Similar diffuse gaseous distention of multiple loops of bowel.     MRI Cervical Thoracic Lumbar Spine W Wo Contrast    Result Date: 09/27/2022  EXAM: Magnetic resonance imaging, spine, without and with contrast material, cervical, thoracic and lumbar. DATE: 09/26/2022 11:44 PM ACCESSION: [id number] UN DICTATED: 09/27/2022 12:05 AM INTERPRETATION LOCATION: [facility] CLINICAL INDICATION: 22 years old Female with Rule out compression from desmoid tumor causing neurological symptoms  COMPARISON: CT chest abdomen pelvis 09/26/2022 TECHNIQUE: Multiplanar MRI was performed through the cervical, thoracic, and lumbar spine prior to and following intravenous contrast administration. FINDINGS: Bone marrow signal intensity is normal. The cord is unremarkable and the conus medullaris ends at a normal level. There is no abnormal enhancement. The vertebral bodies are normally aligned. Reversal of the usual cervical lordosis. CERVICAL: Mild multilevel disc desiccation greatest at C5-C6. Mild spinal canal narrowing at C4-C6. No significant neural foraminal narrowing. THORACIC: Moderate right convex scoliosis. No significant spinal canal or neural foraminal narrowing. LUMBAR: No significant spinal canal or neural foraminal narrowing. 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 The paraspinal tissues  are within normal limits.     --No acute findings in the cervical, thoracic or lumbar spine. --Mild degenerative changes in the cervical spine.     XR Abdomen 1 View    Result Date: 09/27/2022  EXAM: XR ABDOMEN 1 VIEW ACCESSION: [id number] UN CLINICAL INDICATION: 22 years old with NGT (CATHETER VASCULAR FIT & ADJ)  COMPARISON: Same day 12:10 a.m. TECHNIQUE: Semiupright view of the abdomen, 1 image(s) FINDINGS: Esophagogastric tube tip and sideport overlie the anticipated location of the stomach. The tube appears mildly kinked in multiple locations distal to the side port, new from prior. Similar bowel gas pattern with gaseous distention of multiple loops of bowel throughout the partially imaged upper abdomen. No acute osseous abnormality. Partially imaged lung bases are clear.     Satisfactory position of the esophagogastric tube which appears mildly kinked in multiple locations distal to the side port, new from prior. Correlate with tube function. Diffuse gaseous distention of multiple loops of bowel suggestive of ileus.     XR Abdomen 1 View    Result Date: 09/27/2022  EXAM: XR ABDOMEN 1 VIEW ACCESSION: [id number] UN CLINICAL INDICATION: 22 years old with NGT (CATHETER VASCULAR FIT & ADJ)  COMPARISON: Same day 5:56 p.m. TECHNIQUE: Upright view of the abdomen, 1 image(s) FINDINGS: Slightly advanced esophagogastric tube tip and sideport overlying the proximal stomach. Gaseous distention of loops of small bowel within the upper abdomen measuring as large as 3.1 cm in diameter, similar to prior. No pneumoperitoneum. Lung bases are clear. No acute osseous abnormality.     Satisfactory position of the esophagogastric tube. ==================== MODIFIED REPORT: (09/27/2022 7:21 AM) This report has been modified from its preliminary version; you may check the prior versions of radiology report, results history link for prior report versions (if they were previously visible in [employer]). -----------------------------------------------     ECG 12 Lead    Result Date: 09/26/2022  NORMAL SINUS RHYTHM NORMAL ECG WHEN COMPARED WITH ECG OF 14-Aug-2022 12:03, NO SIGNIFICANT CHANGE WAS FOUND Confirmed by Eldred Manges [phone]) on 09/26/2022 11:41:24 PM    XR Abdomen Portable    Result Date: 09/26/2022  EXAM: XR ABDOMEN PORTABLE ACCESSION: [id number] UN CLINICAL INDICATION: 22 years old with NGT (CATHETER VASCULAR FIT & ADJ)  COMPARISON: Earlier same day abdominal radiograph and CT chest abdomen and pelvis TECHNIQUE: Semiupright view of the abdomen, 1 image(s) FINDINGS: Nonweighted esophagogastric tube with tip overlying the stomach and sideport likely just beyond the GE junction. Similar dilated loops of small bowel in the upper abdomen. Excreted contrast in the renal collecting systems. Examination is otherwise unchanged from the earlier same day studies.     Nonweighted esophagogastric tube with tip overlying the stomach and sideport likely just beyond the GE junction. Consider slight advancement for optimal positioning.     CT Abdomen Pelvis W Contrast    Result Date: 09/26/2022  EXAM: CT ABDOMEN PELVIS W CONTRAST ACCESSION: [id number] UN CLINICAL INDICATION: 22 years old with peritonitis, lactate 8.3  COMPARISON: CT chest/abdomen/pelvis TECHNIQUE: A helical CT scan of the abdomen and pelvis was obtained following IV contrast from the lung bases through the pubic symphysis. Images were reconstructed in the axial plane. Coronal and sagittal reformatted images were also provided for further evaluation. FINDINGS: LOWER CHEST: Please see dedicated CT chest for characterization of findings above the diaphragm. LIVER: Normal liver contour.  Subcentimeter hypoattenuating lesions, too small to characterize. BILIARY: The gallbladder is normal in appearance. Mild prominence of the common bile duct, measuring up to 7mm, similar to prior. SPLEEN:  Normal in size and contour. PANCREAS: Normal pancreatic contour.  No focal lesions.  No ductal dilation. ADRENAL GLANDS: Normal appearance of the adrenal glands. KIDNEYS/URETERS: Symmetric renal enhancement.  No hydronephrosis.  SImilar cyst in the left kidney. BLADDER: Mild circumferential bladder wall thickening, likely accentuated by underdistention and similar to prior. Heterogeneous hyperattenuation within the bladder, likely excreted IV contrast. REPRODUCTIVE ORGANS: Uterus is present. No suspicious adnexal lesion. GI TRACT: Sequela of proctocolectomy with ileoanal anastomosis. Multiple dilated air and fluid-containing small bowel loops in the abdomen without a discrete transition point. Similar fluid-density contents in the neorectum.  The appendix is surgically absent. PERITONEUM, RETROPERITONEUM AND MESENTERY: No free air.  Trace perihepatic free fluid  along the inferior margin of the right hepatic lobe (2:67, 4:32)).  No loculated fluid collection. Similar appearance of ill-defined lesion in the lower abdominal mesentery with surrounding inflammatory fat stranding (2:90). LYMPH NODES: No adenopathy. VESSELS: Hepatic and portal veins are patent.  Normal caliber aorta.  BONES and SOFT TISSUES: No aggressive osseous lesions. SImilar 2.6 cm right inferior rectus intramuscular mass (2:128). Similar stranding adjacent to the left femoral vessels. Similar bilateral gluteal subcutaneous nodules.     Trace free fluid in the right upper quadrant adjacent to the liver, of uncertain etiology.  Multiple dilated air and fluid-containing bowel loops without a discrete transition point, question ileus. Similar right inferior rectus mass and lower abdominal mesenteric soft tissue with surrounding stranding.     CTA Chest W Contrast    Result Date: 09/26/2022  EXAM: CTA CHEST W CONTRAST ACCESSION: [id number] UN CLINICAL INDICATION: concern for PE TECHNIQUE: Contiguous axial images were reconstructed through the chest following a single breath hold helical acquisition during the administration of intravenous contrast material. Images were reformatted in the coronal and sagittal planes. MIP slabs were also constructed. COMPARISON: 07/16/2021 CTA Chest with contrast. FINDINGS: PULMONARY ARTERIES: No emboli in either lung. Main pulmonary artery is normal in size. HEART AND VASCULATURE: Cardiac chambers are normal in size. No pericardial effusion. Aorta is normal in caliber. LUNGS, AIRWAYS, AND PLEURA: Lungs are clear. Central airways are patent. No pleural effusion or pneumothorax. MEDIASTINUM AND LYMPH NODES: No enlarged intrathoracic, axillary, or supraclavicular lymph nodes. No mediastinal mass or other abnormality. CHEST WALL AND BONES: Unremarkable. UPPER ABDOMEN: Unremarkable. OTHER: No other findings.     No emboli and no acute findings.    XR Abdomen 1 View    Result Date: 09/26/2022  EXAM: XR ABDOMEN 1 VIEW ACCESSION: [id number] UN CLINICAL INDICATION: 22 years old with ABDOMINAL PAIN  -  UNSPECIFIED SITE  COMPARISON: CT chest abdomen pelvis 09/16/2022 TECHNIQUE: Supine views of the abdomen, 2 images FINDINGS: Diffuse gaseous distention of multiple loops of small and large bowel with borderline small bowel dilatation measuring up to 3.1 cm. The lung bases are clear. No acute osseous abnormalities. Suture material throughout the lower abdominal quadrants and pelvis.     Diffuse gaseous distention of multiple loops of small and large bowel, suggestive of ileus.    XR Chest Portable    Result Date: 09/26/2022  EXAM: XR CHEST PORTABLE ACCESSION: [id number] UN CLINICAL INDICATION: CHEST PAIN  TECHNIQUE: Single View AP Chest Radiograph. COMPARISON: 07/31/2022 FINDINGS: Right IJ central venous catheter with tip in superior right atrium. Lungs are clear.  No pleural effusion or pneumothorax. Normal heart size and mediastinal contours.     No acute abnormalities.    IR Inj Intralesional 1-7 Lesions    Result Date: 09/26/2022  EXAM: IR INJ INTRALESIONAL 1-7 LESIONS DATE: 09/25/2022  1:57 PM ACCESSION: [id number] UN DICTATED: 09/25/2022 3:38 PM INTERPRETATION LOCATION: [facility] PROCEDURE: Ultrasound-guided intralesional injection Pre-procedure diagnosis: [name], desmoid tumors Post-procedure diagnosis: Same Indication: Reduction of pain/symptoms Additional clinical history: None _______________________________________________________________ PROCEDURE SUMMARY: - Percutaneous US-guided intralesional injection of desmoid tumors - Additional procedure(s): None PROCEDURE DETAILS: Procedure personnel: Attending physician(s): Ammie Dalton Fellow physician(s): Renato Battles Resident physician(s): None Advanced practice provider(s): None Pre-procedure Consent: Informed consent for the procedure including risks, benefits and alternatives was obtained and time-out was performed prior to the procedure. Preparation: The site was prepared and draped using maximal sterile barrier technique including cutaneous antisepsis. Anesthesia/sedation Level of anesthesia/sedation: General anesthesia Anesthesia/sedation administered by: Anesthesiology Total intra-service sedation time (minutes): I personally spent 0 minutes, continuously monitoring the patient face-to-face during the administration of moderate sedation. Radiology nurse was present for the duration of the procedure to assist in patient monitoring. Pre and Post Sedation activities have been reviewed. Imaging prior to intralesional injection The patient was positioned supine. Initial ultrasound of the right dorsolateral forearm and soft tissues of the left anterior upper chest was performed. Biopsy targets: - Locations: 1. Right dorsoateral forearm, proximal 2. Right dorsolateral forearm, distal 3. Left upper anterior chest wall near the coracoid process Other findings: None Injection Equal parts triamcinolone acetonide (200 mg per 5 mL) and 0.5% bupivacaine (5 mg per mL) were mixed together on the backtable. Lesion 1: Local anesthesia was administered. Under US guidance, the 22-G injection needle was advanced to the target and intralesional injection of 0.2 mL triamcinolone/bupivacaine mixture (4mg  triamcinolone and 0.1mg  bupivacaine) was performed over 0 minutes. Lesion 2: Local anesthesia was administered. Under US guidance, the 22-G injection needle was advanced to the target and intralesional injection of 0.1 mL triamcinolone/bupivacaine mixture (2mg  triamcinolone and 0.05mg  bupivacaine) was performed over 0 minutes. Lesion 3: Local anesthesia was administered. Under US guidance, the 22-G injection needle was advanced to the target and intralesional injection of 0.1 mL triamcinolone/bupivacaine mixture (2mg  triamcinolone and 0.05mg  bupivacaine) was performed over 0 minutes. Needle removal At each site, the injection needle was removed and a sterile dressing was applied. Tract embolization: None Imaging following biopsy Immediate post-injection ultrasound was not performed. Post-injection imaging findings: Not applicable Additional Details Additional description of procedure: None Equipment details: None Specimens removed: None Estimated blood loss (mL): less than 10 mL Attestation Dr. Cathie Hoops was present for and supervised the entire procedure. Complications: No immediate complications.     INCOMPLETE Ultrasound-guided intralesional injection of a total of 0.4 mL of a mixture of triamcinolone (8mg ) and bupivacaine (0.2mg ) in 2 separate right forearm desmoid tumors and a left anterior upper chest wall desmoid tumor, fully detailed below.    ______________________________________________________________________  Discharge Instructions:         Follow Up instructions and Outpatient Referrals     Call MD for:      Discharge instructions          Other Instructions       Call MD for:      Inability to wean off tube feeds or IV pain medications or IV antiemetics, increasing pain or spasms, increasing nausea or vomiting, fevers, redness, tenderness or purulent drainage from central line site    Discharge instructions      I certify that based on my evaluation of this patient, this patient requires BLS transportation services and that other forms of transport are contraindicated.  Please refer to care management transitions note for transportation details.  Appointments which have been scheduled for you      Dec 11, 2022 8:45 AM  (Arrive by 8:30 AM)  RETURN PEDS with Gala Romney, MD  [facility] DERMATOLOGY AND [facility] [place] [facility] REGION) [address]  [place] [address]  [phone]        Dec 11, 2022 2:30 PM  (Arrive by 2:00 PM)  RETURN VIDEO MYCHART with Lissa Morales, MD  [facility] CCSP 2ND FLR CANCER HOSP [place] [facility] REGION) [address]  [place] [address]  [phone]   Please sign into My [place] Chart at least 15 minutes before your appointment to complete the eCheck-In process. You must complete eCheck-In before you can start your video visit. We also recommend testing your audio and video connection to troubleshoot any issues before your visit begins. Click ???Join Video Visit??? to complete these checks. Once you have completed eCheck-In and tested your audio and video, click ???Join Call??? to connect to your visit.     For your video visit, you will need a computer with a working camera, speaker and microphone, a smartphone, or a tablet with internet access.    My [place] Chart enables you to manage your health, send non-urgent messages to your provider, view your test results, schedule and manage appointments, and request prescription refills securely and conveniently from your computer or mobile device.    You can go to [email or web address] to sign in to your My [place] Chart account with your username and password. If you have forgotten your username or password, please choose the ???Forgot Username???? and/or ???Forgot Password???? links to gain access. You also can access your My [place] Chart account with the free MyChart mobile app for Android or iPhone.    If you need assistance accessing your My [place] Chart account or for assistance in reaching your provider's office to reschedule or cancel your appointment, please call [facility] [phone].         Dec 25, 2022 3:30 PM  (Arrive by 3:15 PM)  RETURN IBD with Modena Nunnery, MD  [facility] GI MEDICINE [place] [facility] REGION) [address]  [facility] 1 through 4  [place] [address]  [phone]        Jan 14, 2023 10:45 AM  (Arrive by 10:30 AM)  RETURN HEM BENIGN with Garry Heater Jorge Ny, [place]  [facility] BENIGN HEMATOLOGY CLINIC [place] [facility] REGION) [address]  [facility] 1 through 4  [place] [address]  [phone]        Jan 16, 2023 8:30 AM  (Arrive by 8:15 AM)  RETURN  ENDOCRINE with Bonnita Hollow, MD  [facility] DIABETES AND ENDOCRINOLOGY [place] [facility] REGION) [address]  [facility] 1 through 4  [place] [address]  [phone]        Feb 20, 2023 10:00 AM  (Arrive by 9:45 AM)  NEW UROGYN with Kary Kos, MD  [facility] UROGYN AND RECON PELVIC SURGERY [facility] [place]) [address]  [address]  [place] [address]  [phone]             ______________________________________________________________________  Discharge Day Services:  BP 107/78  - Pulse 90  - Temp 36.4 ??C (97.5 ??F) (Oral)  - Resp 16  - Ht 165.1 cm (5' 5)  - Wt 61 kg (134 lb  8 oz)  - SpO2 97%  - BMI 22.38 kg/m??   Pt seen on the day of discharge and determined appropriate for discharge.    Condition at Discharge: fair    Length of Discharge: I spent greater than 30 mins in the discharge of this patient.

## 2022-11-19 NOTE — Unmapped (Signed)
Patient alert and oriented x 4 today.  Vitals have been stable. No calls from telemetry so far during shift.  Patient continues to get cyclic tube feeding from 7pm to 8am.  Appetite seems pretty good, eating chick fil a for lunch.  Patient receiving alternating zofran and phenergan for nausea.  Dilauded given for complaints of pain in her back and shoulders.  Mom continues to be at bedside for support.  Plan is for AIR.  Will monitor.      Problem: Adult Inpatient Plan of Care  Goal: Plan of Care Review  Outcome: Progressing  Goal: Patient-Specific Goal (Individualized)  Outcome: Progressing  Flowsheets (Taken 11/19/2022 1448)  Patient/Family-Specific Goals (Include Timeframe): Free from falls or injuries through discharge  Individualized Care Needs: Falls, labs, VS, pain/nausea, NG tube, cyclic tube feeds  Anxieties, Fears or Concerns: pain/nausea  Goal: Absence of Hospital-Acquired Illness or Injury  Outcome: Progressing  Intervention: Identify and Manage Fall Risk  Recent Flowsheet Documentation  Taken 11/19/2022 0800 by Calea Hribar K, RN  Safety Interventions:   fall reduction program maintained   low bed   nonskid shoes/slippers when out of bed  Intervention: Prevent Skin Injury  Recent Flowsheet Documentation  Taken 11/19/2022 0800 by Simara Rhyner K, RN  Device Skin Pressure Protection:   absorbent pad utilized/changed   adhesive use limited  Skin Protection: adhesive use limited  Intervention: Prevent and Manage VTE (Venous Thromboembolism) Risk  Recent Flowsheet Documentation  Taken 11/19/2022 1000 by Zaden Sako K, RN  VTE Prevention/Management:   ambulation promoted   fluids promoted  Anti-Embolism Intervention: Refused  Goal: Optimal Comfort and Wellbeing  Outcome: Progressing  Intervention: Monitor Pain and Promote Comfort  Recent Flowsheet Documentation  Taken 11/19/2022 1000 by Danelia Snodgrass K, RN  Pain Management Interventions:   care clustered   pillow support provided   pain management plan reviewed with patient/caregiver   position adjusted   quiet environment facilitated  Intervention: Provide Person-Centered Care  Recent Flowsheet Documentation  Taken 11/19/2022 1000 by Avanti Jetter K, RN  Trust Relationship/Rapport:   care explained   questions encouraged   reassurance provided   thoughts/feelings acknowledged  Goal: Readiness for Transition of Care  Outcome: Progressing  Goal: Rounds/Family Conference  Outcome: Progressing

## 2022-11-19 NOTE — Unmapped (Signed)
Pt admitted for Acute on chronic pain/acute back pain secondary to MSK injury/abdominal desmoid fibromatosis. Pain managed with oral and IV pain meds. Pt continues to request IV antiemetics for nausea. Tolerating tube feeds overnight at goal rate. Pt able to eat small bites of food. Pt able to swallow meds whole with applesauce. Pt had 2 BM's this shift. Pt's mother was at her bedside. Pt compliant with SCD's and Prafo boots while in bed. Fall precaution remain in place with no fall this shift. VSS on 2L [place].    Problem: Adult Inpatient Plan of Care  Goal: Plan of Care Review  Outcome: Progressing  Goal: Patient-Specific Goal (Individualized)  Outcome: Progressing  Goal: Absence of Hospital-Acquired Illness or Injury  Outcome: Progressing  Intervention: Identify and Manage Fall Risk  Recent Flowsheet Documentation  Taken 11/18/2022 1910 by Deloria Lair, RN  Safety Interventions:   fall reduction program maintained   family at bedside   infection management   low bed  Intervention: Prevent Skin Injury  Recent Flowsheet Documentation  Taken 11/18/2022 2040 by Deloria Lair, RN  Positioning for Skin: Supine/Back  Device Skin Pressure Protection: adhesive use limited  Skin Protection: adhesive use limited  Intervention: Prevent and Manage VTE (Venous Thromboembolism) Risk  Recent Flowsheet Documentation  Taken 11/18/2022 2040 by Deloria Lair, RN  VTE Prevention/Management:   ambulation promoted   dorsiflexion/plantar flexion performed  Anti-Embolism Device Type: SCD, Knee  Anti-Embolism Intervention: Refused  Anti-Embolism Device Location: BLE  Intervention: Prevent Infection  Recent Flowsheet Documentation  Taken 11/18/2022 1910 by Deloria Lair, RN  Infection Prevention:   personal protective equipment utilized   rest/sleep promoted  Goal: Optimal Comfort and Wellbeing  Outcome: Progressing  Goal: Readiness for Transition of Care  Outcome: Progressing  Goal: Rounds/Family Conference  Outcome: Progressing     Problem: Latex Allergy  Goal: Absence of Allergy Symptoms  Outcome: Progressing     Problem: Fall Injury Risk  Goal: Absence of Fall and Fall-Related Injury  Outcome: Progressing  Intervention: Promote Injury-Free Environment  Recent Flowsheet Documentation  Taken 11/18/2022 1910 by Deloria Lair, RN  Safety Interventions:   fall reduction program maintained   family at bedside   infection management   low bed     Problem: Skin Injury Risk Increased  Goal: Skin Health and Integrity  Outcome: Progressing  Intervention: Optimize Skin Protection  Recent Flowsheet Documentation  Taken 11/18/2022 2040 by Deloria Lair, RN  Pressure Reduction Techniques: frequent weight shift encouraged  Pressure Reduction Devices: pressure-redistributing mattress utilized  Skin Protection: adhesive use limited     Problem: Self-Care Deficit  Goal: Improved Ability to Complete Activities of Daily Living  Outcome: Progressing     Problem: Comorbidity Management  Goal: Blood Glucose Levels Within Targeted Range  Outcome: Progressing  Goal: Blood Pressure in Desired Range  Outcome: Progressing     Problem: Nausea and Vomiting  Goal: Nausea and Vomiting Relief  Outcome: Progressing     Problem: Infection  Goal: Absence of Infection Signs and Symptoms  Outcome: Progressing  Intervention: Prevent or Manage Infection  Recent Flowsheet Documentation  Taken 11/18/2022 1910 by Deloria Lair, RN  Infection Management: aseptic technique maintained  Isolation Precautions: protective precautions maintained     Problem: Pain Acute  Goal: Optimal Pain Control and Function  Outcome: Progressing     Problem: Malnutrition  Goal: Improved Nutritional Intake  Outcome: Progressing

## 2022-11-19 NOTE — Unmapped (Signed)
Pt A&Ox4, VSS, up with x2 assist in room and hall, PRN dilaudid and oxycodone given for pain, IV phenergan and zofran given for nausea, bilateral knee braces applied, cyclic tube feed stopped at 3557 per order, continuing with plan of care.    Problem: Adult Inpatient Plan of Care  Goal: Plan of Care Review  Outcome: Progressing  Flowsheets (Taken 11/18/2022 1950)  Progress: improving  Plan of Care Reviewed With: patient  Goal: Patient-Specific Goal (Individualized)  Outcome: Progressing  Goal: Absence of Hospital-Acquired Illness or Injury  Outcome: Progressing  Intervention: Prevent and Manage VTE (Venous Thromboembolism) Risk  Recent Flowsheet Documentation  Taken 11/18/2022 0800 by Camila Li, RN  Anti-Embolism Device Type: SCD, Knee  Anti-Embolism Intervention: Refused  Anti-Embolism Device Location: BLE  Goal: Optimal Comfort and Wellbeing  Outcome: Progressing  Goal: Readiness for Transition of Care  Outcome: Progressing  Goal: Rounds/Family Conference  Outcome: Progressing     Problem: Latex Allergy  Goal: Absence of Allergy Symptoms  Outcome: Progressing     Problem: Fall Injury Risk  Goal: Absence of Fall and Fall-Related Injury  Outcome: Progressing     Problem: Skin Injury Risk Increased  Goal: Skin Health and Integrity  Outcome: Progressing     Problem: Self-Care Deficit  Goal: Improved Ability to Complete Activities of Daily Living  Outcome: Progressing     Problem: Comorbidity Management  Goal: Blood Glucose Levels Within Targeted Range  Outcome: Progressing  Goal: Blood Pressure in Desired Range  Outcome: Progressing     Problem: Nausea and Vomiting  Goal: Nausea and Vomiting Relief  Outcome: Progressing     Problem: Infection  Goal: Absence of Infection Signs and Symptoms  Outcome: Progressing     Problem: Pain Acute  Goal: Optimal Pain Control and Function  Outcome: Progressing     Problem: Malnutrition  Goal: Improved Nutritional Intake  Outcome: Progressing

## 2022-11-19 NOTE — Unmapped (Addendum)
[facility] Clinical Assessment & Refill Coordination Note    [name] [name], DOB: 08/11/2000  Phone: [phone] (home) [phone] (work)    All above HIPAA information was verified with patient's family member, mom.     Was a Nurse, learning disability used for this call? No    Specialty Medication(s):   Hematology/Oncology: [name]     No current facility-administered medications for this visit.     Current Outpatient Medications   Medication Sig Dispense Refill    doxycycline (VIBRA-TABS) 100 MG tablet TAKE 1 TABLETS BY MOUTH TWICE A DAY ON TUESDAY, THURSDAY, SATURDAY, SUNDAY. 28 tablet 0     Facility-Administered Medications Ordered in Other Visits   Medication Dose Route Frequency Provider Last Rate Last Admin    acetaminophen (TYLENOL) tablet 650 mg  650 mg Enteral tube: gastric Q6H PRN Anell Barr, MD   650 mg at 10/22/22 2123    aluminum-magnesium hydroxide-simethicone (MAALOX PLUS) 200-200-20 mg/5 mL suspension 60 mL  60 mL Oral Q6H PRN Anell Barr, MD        baclofen (LIORESAL) tablet 10 mg  10 mg Oral TID PRN Earna Coder, MD   10 mg at 11/15/22 1702    bisacodyl (DULCOLAX) suppository 10 mg  10 mg Rectal BID PRN Anell Barr, MD        buprenorphine-naloxone (SUBOXONE) 2-0.5 mg SL film 1 mg of buprenorphine  0.5 Film Sublingual TID Earna Coder, MD   1 mg of buprenorphine at 11/19/22 0933    butalbital-acetaminophen-caffeine (ESGIC) per tablet 1 tablet  1 tablet Oral BID PRN Erlinda Hong, MD        carboxymethylcellulose sodium (REFRESH CELLUVISC) 1 % ophthalmic gel 1 drop  1 drop Both Eyes QID Dacillo-Curso, Amy, ACNP   1 drop at 11/18/22 2242    carboxymethylcellulose sodium (REFRESH CELLUVISC) 1 % ophthalmic gel 2 drop  2 drop Both Eyes QID PRN Dacillo-Curso, Amy, ACNP   2 drop at 11/01/22 1655    CHEMO CLARIFICATION ORDER   Other Continuous PRN Horald Pollen, MD PhD        CHEMO CLARIFICATION ORDER   Other Continuous PRN Horald Pollen, MD PhD        diazePAM (VALIUM) tablet 2.5 mg  2.5 mg Oral Daily Kateri Plummer, MD   2.5 mg at 11/19/22 2725    And    diazePAM (VALIUM) tablet 2.5 mg  2.5 mg Oral Daily Kateri Plummer, MD   2.5 mg at 11/18/22 1555    diazePAM (VALIUM) tablet 2.5 mg  2.5 mg Oral Nightly PRN Kateri Plummer, MD        diazePAM (VALIUM) tablet 5 mg  5 mg Oral Nightly Kateri Plummer, MD   5 mg at 11/18/22 2242    diclofenac sodium (VOLTAREN) 1 % gel 2 g  2 g Topical QID Dacillo-Curso, Amy, ACNP   2 g at 11/18/22 1856    famotidine (PEPCID) tablet 20 mg  20 mg Oral Nightly Anell Barr, MD   20 mg at 11/18/22 2242    gabapentin (NEURONTIN) capsule 100 mg  100 mg Oral TID Dacillo-Curso, Amy, ACNP   100 mg at 11/19/22 0935    guaiFENesin (ROBITUSSIN) oral syrup  200 mg Oral Q4H PRN Anell Barr, MD   200 mg at 10/17/22 2344    hydrocortisone 2.5 % cream   Topical BID PRN Dacillo-Curso, Amy, ACNP   Given at 11/11/22 2223  HYDROmorphone (PF) (DILAUDID) injection 0.5 mg  0.5 mg Intravenous Once Erlinda Hong, MD        HYDROmorphone (PF) (DILAUDID) injection 0.5 mg  0.5 mg Intravenous Q6H PRN Erlinda Hong, MD   0.5 mg at 11/19/22 0724    IP OKAY TO TREAT   Other Continuous PRN Horald Pollen, MD PhD        lidocaine (ASPERCREME) 4 % 2 patch  2 patch Transdermal Daily Kateri Plummer, MD   2 patch at 11/19/22 0936    mirtazapine (REMERON) tablet 15 mg  15 mg Enteral tube: gastric Nightly Anell Barr, MD   15 mg at 11/18/22 2255    mucositis mixture (with lidocaine)  10 mL Mouth QID PRN Dacillo-Curso, Amy, ACNP   10 mL at 10/26/22 2146    multivitamin with folic acid 400 mcg tablet 1 tablet  1 tablet Oral Daily Anell Barr, MD   1 tablet at 11/19/22 0935    naloxegol (MOVANTIK) 12.5 mg tablet 12.5 mg  12.5 mg Oral Daily Anell Barr, MD   12.5 mg at 11/19/22 8469    Nirogacestat (Ogsiveo) tablet 100 mg **Patient Supplied**  100 mg Oral BID Horald Pollen, MD PhD   100 mg at 11/19/22 0935    [START ON 11/25/2022] Nirogacestat (Ogsiveo) tablet 150 mg **Patient Supplied**  150 mg Oral BID Horald Pollen, MD PhD        ondansetron [facility]) injection 4 mg  4 mg Intravenous Q12H PRN Dacillo-Curso, Amy, ACNP   4 mg at 11/19/22 6295    oxyCODONE (ROXICODONE) immediate release tablet 10 mg  10 mg Oral Q4H PRN Hemsey, Huntley Estelle, MD        Or    oxyCODONE (ROXICODONE) immediate release tablet 15 mg  15 mg Oral Q4H PRN Earna Coder, MD   15 mg at 11/18/22 2255    pantoprazole (Protonix) EC tablet 40 mg  40 mg Oral BID Anell Barr, MD   40 mg at 11/19/22 0937    phenol (CHLORASEPTIC) 1.4 % spray 2 spray  2 spray Mucous Membrane Q2H PRN Dacillo-Curso, Amy, ACNP   2 spray at 10/03/22 0138    polyethylene glycol (MIRALAX) packet 17 g  17 g Oral BID PRN Anell Barr, MD   17 g at 11/10/22 2132    promethazine (PHENERGAN) 6.5 mg in sodium chloride (NS) 0.9 % 50 mL IVPB  6.5 mg Intravenous Q6H PRN Dacillo-Curso, Amy, ACNP   Stopped at 11/19/22 0801    pseudoephedrine (SUDAFED) tablet 30 mg  30 mg Oral Daily PRN Anell Barr, MD        senna [facility]) tablet 1 tablet  1 tablet Oral BID PRN Anell Barr, MD   1 tablet at 11/16/22 2211    sodium chloride (NS) 0.9 % infusion  10 mL/hr Intravenous Continuous Dacillo-Curso, Amy, ACNP 10 mL/hr at 11/09/22 0314 10 mL/hr at 11/09/22 0314    zinc oxide-cod liver oil (DESITIN 40%) Paste   Topical Daily PRN Dacillo-Curso, Amy, ACNP   Given at 11/11/22 2223        Changes to medications:  resuming Ogsiveo at 50 mg twice daily dose and gradually ramping up to 150 mg twice daily as tolerated    Allergies   Allergen Reactions    Adhesive Tape-Silicones Hives and Rash     Paper tape and Tegederm ok. Biopatch causes blistering but has tolerated CHG Tegaderm  Ferrlecit [Sodium Ferric Gluconat-Sucrose] Swelling and Rash    Levofloxacin Swelling and Rash Swelling in mouth, rash,     Methylnaltrexone      Per Patient: I lost bowel control, severe abdominal cramping, and elevated BP    Neomycin Swelling     Rxn after ear drops; ear swelling    Papaya Hives    Morphine Nausea And Vomiting    Zosyn [Piperacillin-Tazobactam] Itching and Rash     Red and itchy    Compazine [Prochlorperazine] Other (See Comments)     Extreme agitation    Iron Analogues     Iron Dextran Itching     Received iron dextran 06/08/22 over 12 hours, had itching and redness/flushing during the infusion and for a couple days after. Required IV benadryl w/flares between doses and ultimately treated w/IV methylpred for 2 days.     Latex, Natural Rubber Rash       Changes to allergies: No    SPECIALTY MEDICATION ADHERENCE     Ogsiveo 50 mg: 44 tablets of medicine on hand     Are there any concerns with adherence?  No - medication was on hold    Adherence counseling provided? Not needed    Patient-Reported Symptoms Tracker for Cancer Patients on Oral Chemotherapy     Oral chemotherapy medication name(s): Ogsiveo  Dose and frequency: 50 mg twice daily x 7 days.  If tolerated will ramp up to 100 mg twice daily, if tolerated will continue ramp up to 150 mg twice daily  Oral Chemotherapy Start Date:    Baseline? No  Clinic(s) visited: [facility]    Symptom Grouping Question Patient Response   Digestion and Eating Have you felt sick to your stomach? Did not assess    Had diarrhea? Did not assess    Constipated? Did not assess    Not wanting to eat? Did not assess    Comments      Sleep and Pain Felt very tired even after you rest? Did not assess    Pain due to cancer medication or cancer? Did not assess    Comments     Other Side Effects Numbness or tingling in hands and/or feet? Did not assess    Felt short of breath? Did not assess    Mouth or throat Sores? Did not assess    Rash? Did not assess    Palmar-plantar erythrodysesthesia syndrome?      Rash - acneiform?      Rash - maculo-papular?      How many days over the past month did your cancer medication or cancer keep you from your normal activities?  Write in number of days, 0-30:  she has been inpatient for extended period of time    Other side effects or things you would like to discuss?      Comments?     Adherence  In the last 30 days, on how many days did you miss at least one dose of any of your [drug name]? Write in number of days, 0-30:  med was on hold for extended period of time and recently resumed    What reasons are you having trouble taking your medication [pharmacist: check all that apply]? Specify chemotherapy cycle:        Instructed by provider to hold or take differently    Comments:        Comments       Optional Symptom Tracking Comments: Did not do clinical assessment as she  has been inpatient for over 50 days    CLINICAL MANAGEMENT AND INTERVENTION      Clinical Benefit Assessment:    Do you feel the medicine is effective or helping your condition? Yes    Clinical Benefit counseling provided? Not needed    Acute Infection Status:    Acute infections noted within Epic:  No active infections    Patient reported infection:  Did not discuss    Therapy Appropriateness:    Is therapy appropriate based on current medication list, adverse reactions, adherence, clinical benefit and progress toward achieving therapeutic goals?  Yes, therapy is appropriate and should be continued    DISEASE/MEDICATION-SPECIFIC INFORMATION      N/A    Is the patient receiving adequate infection prevention treatment?  NA    Does the patient have adequate nutritional support? Not applicable    PATIENT SPECIFIC NEEDS     Does the patient have any physical, cognitive, or cultural barriers? No    Is the patient high risk? No    Did the patient require a clinical intervention? No    Does the patient require physician intervention or other additional services (i.e., nutrition, smoking cessation, social work)? No    SOCIAL DETERMINANTS OF HEALTH     At the [facility], we have learned that life circumstances - like trouble affording food, housing, utilities, or transportation can affect the health of many of our patients.   That is why we wanted to ask: are you currently experiencing any life circumstances that are negatively impacting your health and/or quality of life? Patient declined to answer    Social Determinants of Health     Financial Resource Strain: Low Risk  (09/26/2022)    Overall Financial Resource Strain (CARDIA)     Difficulty of Paying Living Expenses: Not very hard   Internet Connectivity: Not on file   Food Insecurity: No Food Insecurity (09/26/2022)    Hunger Vital Sign     Worried About Running Out of Food in the Last Year: Never true     Ran Out of Food in the Last Year: Never true   Tobacco Use: Low Risk  (10/10/2022)    Patient History     Smoking Tobacco Use: Never     Smokeless Tobacco Use: Never     Passive Exposure: Past   Housing/Utilities: Low Risk  (09/26/2022)    Housing/Utilities     Within the past 12 months, have you ever stayed: outside, in a car, in a tent, in an overnight shelter, or temporarily in someone else's home (i.e. couch-surfing)?: No     Are you worried about losing your housing?: No     Within the past 12 months, have you been unable to get utilities (heat, electricity) when it was really needed?: No   Alcohol Use: Not on file   Transportation Needs: No Transportation Needs (09/26/2022)    PRAPARE - Transportation     Lack of Transportation (Medical): No     Lack of Transportation (Non-Medical): No   Substance Use: Not on file   Health Literacy: Not on file   Physical Activity: Not on file   Interpersonal Safety: Unknown (11/19/2022)    Interpersonal Safety     Unsafe Where You Currently Live: Not on file     Physically Hurt by Anyone: Not on file     Abused by Anyone: Not on file   Stress: Not on file   Intimate Partner Violence: Not on file  Depression: Not at risk (01/25/2022)    Received from [facility]    PHQ-2   Social Connections: Not on file       Would you be willing to receive help with any of the needs that you have identified today? Not applicable       SHIPPING     Specialty Medication(s) to be Shipped:   Hematology/Oncology: [name]    Other medication(s) to be shipped: No additional medications requested for fill at this time     Changes to insurance: No    Delivery Scheduled: Yes, Expected medication delivery date: 11/20/22.     Medication will be delivered via Same Day Courier to the confirmed temporary address in [facility].    The patient will receive a drug information handout for each medication shipped and additional FDA Medication Guides as required.  Verified that patient has previously received a Conservation officer, historic buildings and a Surveyor, mining.    The patient or caregiver noted above participated in the development of this care plan and knows that they can request review of or adjustments to the care plan at any time.      All of the patient's questions and concerns have been addressed.    Kermit Balo, [facility]   [facility] Shared [facility] Specialty Pharmacist

## 2022-11-20 DIAGNOSIS — D48119 Desmoid tumor: Principal | ICD-10-CM

## 2022-11-20 DIAGNOSIS — Z79899 Other long term (current) drug therapy: Principal | ICD-10-CM

## 2022-11-20 LAB — CBC
HEMATOCRIT: 29.3 % — ABNORMAL LOW (ref 34.0–44.0)
HEMOGLOBIN: 9.8 g/dL — ABNORMAL LOW (ref 11.3–14.9)
MEAN CORPUSCULAR HEMOGLOBIN CONC: 33.6 g/dL (ref 32.0–36.0)
MEAN CORPUSCULAR HEMOGLOBIN: 28.2 pg (ref 25.9–32.4)
MEAN CORPUSCULAR VOLUME: 84 fL (ref 77.6–95.7)
MEAN PLATELET VOLUME: 9.4 fL (ref 6.8–10.7)
PLATELET COUNT: 206 10*9/L (ref 150–450)
RED BLOOD CELL COUNT: 3.49 10*12/L — ABNORMAL LOW (ref 3.95–5.13)
RED CELL DISTRIBUTION WIDTH: 16.5 % — ABNORMAL HIGH (ref 12.2–15.2)
WBC ADJUSTED: 6.1 10*9/L (ref 3.6–11.2)

## 2022-11-20 LAB — BASIC METABOLIC PANEL
ANION GAP: 3 mmol/L — ABNORMAL LOW (ref 5–14)
BLOOD UREA NITROGEN: 8 mg/dL — ABNORMAL LOW (ref 9–23)
BUN / CREAT RATIO: 15
CALCIUM: 9.1 mg/dL (ref 8.7–10.4)
CHLORIDE: 108 mmol/L — ABNORMAL HIGH (ref 98–107)
CO2: 32.1 mmol/L — ABNORMAL HIGH (ref 20.0–31.0)
CREATININE: 0.53 mg/dL — ABNORMAL LOW
EGFR CKD-EPI (2021) FEMALE: >90 mL/min/{1.73_m2} (ref >=60)
GLUCOSE RANDOM: 109 mg/dL (ref 70–179)
POTASSIUM: 3.8 mmol/L (ref 3.4–4.8)
SODIUM: 143 mmol/L (ref 135–145)

## 2022-11-20 MED ORDER — LIDOCAINE 4 % TOPICAL PATCH
MEDICATED_PATCH | Freq: Every day | TRANSDERMAL | 0 refills | 30.00000 days | Status: SS
Start: 2022-11-20 — End: ?

## 2022-11-20 MED ORDER — DIAZEPAM 5 MG TABLET
ORAL_TABLET | ORAL | 0 refills | 5.00000 days | Status: SS
Start: 2022-11-20 — End: 2022-11-25

## 2022-11-20 MED ORDER — NIROGACESTAT 50 MG TABLET
ORAL_TABLET | Freq: Two times a day (BID) | ORAL | 5 refills | 30 days | Status: CP
Start: 2022-11-20 — End: ?
  Filled 2022-11-20: qty 90, 15d supply, fill #0

## 2022-11-20 MED ORDER — OGSIVEO 50 MG TABLET
ORAL_TABLET | Freq: Two times a day (BID) | ORAL | 0 refills | 30 days | Status: CN
Start: 2022-11-20 — End: 2022-12-20

## 2022-11-20 MED ORDER — MULTIVITAMIN WITH FOLIC ACID 400 MCG TABLET
ORAL_TABLET | Freq: Every day | ORAL | 0 refills | 90.00000 days | Status: SS
Start: 2022-11-20 — End: ?

## 2022-11-20 MED ORDER — COURIERED MED OR SUPPLY
0 refills | 0 days
Start: 2022-11-20 — End: ?

## 2022-11-20 MED ORDER — NALOXEGOL 12.5 MG TABLET
ORAL_TABLET | Freq: Every day | ORAL | 0 refills | 15.00000 days | Status: SS
Start: 2022-11-20 — End: ?

## 2022-11-20 MED ADMIN — diazePAM (VALIUM) tablet 5 mg: 5 mg | ORAL | @ 01:00:00

## 2022-11-20 MED ADMIN — gabapentin (NEURONTIN) capsule 100 mg: 100 mg | ORAL | @ 15:00:00

## 2022-11-20 MED ADMIN — buprenorphine-naloxone (SUBOXONE) 2-0.5 mg SL film 1 mg of buprenorphine: .5 | SUBLINGUAL | @ 01:00:00

## 2022-11-20 MED ADMIN — diazePAM (VALIUM) tablet 2.5 mg: 2.5 mg | ORAL | @ 18:00:00

## 2022-11-20 MED ADMIN — naloxegol (MOVANTIK) 12.5 mg tablet 12.5 mg: 12.5 mg | ORAL | @ 15:00:00

## 2022-11-20 MED ADMIN — gabapentin (NEURONTIN) capsule 100 mg: 100 mg | ORAL | @ 01:00:00

## 2022-11-20 MED ADMIN — promethazine (PHENERGAN) 6.5 mg in sodium chloride (NS) 0.9 % 50 mL IVPB: 6.5 mg | INTRAVENOUS | @ 02:00:00

## 2022-11-20 MED ADMIN — nirogacestat (OGSIVEO) tablet 100 mg **Patient Supplied**: 100 mg | ORAL | @ 10:00:00 | Stop: 2022-11-26

## 2022-11-20 MED ADMIN — HYDROmorphone (PF) (DILAUDID) injection 0.5 mg: .5 mg | INTRAVENOUS | @ 05:00:00 | Stop: 2022-11-24

## 2022-11-20 MED ADMIN — pantoprazole (Protonix) EC tablet 40 mg: 40 mg | ORAL | @ 04:00:00

## 2022-11-20 MED ADMIN — pantoprazole (Protonix) EC tablet 40 mg: 40 mg | ORAL | @ 15:00:00

## 2022-11-20 MED ADMIN — oxyCODONE (ROXICODONE) immediate release tablet 15 mg: 15 mg | ORAL | @ 10:00:00 | Stop: 2022-11-28

## 2022-11-20 MED ADMIN — gabapentin (NEURONTIN) capsule 100 mg: 100 mg | ORAL | @ 18:00:00

## 2022-11-20 MED ADMIN — buprenorphine-naloxone (SUBOXONE) 2-0.5 mg SL film 1 mg of buprenorphine: .5 | SUBLINGUAL | @ 15:00:00

## 2022-11-20 MED ADMIN — multivitamin with folic acid 400 mcg tablet 1 tablet: 1 | ORAL | @ 15:00:00

## 2022-11-20 MED ADMIN — carboxymethylcellulose sodium (REFRESH CELLUVISC) 1 % ophthalmic gel 1 drop: 1 [drp] | OPHTHALMIC | @ 15:00:00

## 2022-11-20 MED ADMIN — buprenorphine-naloxone (SUBOXONE) 2-0.5 mg SL film 1 mg of buprenorphine: .5 | SUBLINGUAL | @ 18:00:00

## 2022-11-20 MED ADMIN — diclofenac sodium (VOLTAREN) 1 % gel 2 g: 2 g | TOPICAL | @ 15:00:00

## 2022-11-20 MED ADMIN — lidocaine (ASPERCREME) 4 % 2 patch: 2 | TRANSDERMAL | @ 15:00:00

## 2022-11-20 MED ADMIN — diazePAM (VALIUM) tablet 2.5 mg: 2.5 mg | ORAL | @ 15:00:00

## 2022-11-20 MED ADMIN — famotidine (PEPCID) tablet 20 mg: 20 mg | ORAL | @ 04:00:00

## 2022-11-20 MED ADMIN — oxyCODONE (ROXICODONE) immediate release tablet 15 mg: 15 mg | ORAL | @ 01:00:00 | Stop: 2022-11-28

## 2022-11-20 MED ADMIN — HYDROmorphone (PF) (DILAUDID) injection 0.5 mg: .5 mg | INTRAVENOUS | @ 17:00:00 | Stop: 2022-11-24

## 2022-11-20 MED ADMIN — sodium chloride (NS) 0.9 % infusion: 10 mL/h | INTRAVENOUS | @ 02:00:00

## 2022-11-20 MED ADMIN — ondansetron (ZOFRAN) injection 4 mg: 4 mg | INTRAVENOUS | @ 10:00:00

## 2022-11-20 MED ADMIN — nirogacestat (OGSIVEO) tablet 100 mg **Patient Supplied**: 100 mg | ORAL | @ 01:00:00 | Stop: 2022-11-26

## 2022-11-20 MED ADMIN — carboxymethylcellulose sodium (REFRESH CELLUVISC) 1 % ophthalmic gel 1 drop: 1 [drp] | OPHTHALMIC | @ 18:00:00

## 2022-11-20 MED ADMIN — promethazine (PHENERGAN) 6.5 mg in sodium chloride (NS) 0.9 % 50 mL IVPB: 6.5 mg | INTRAVENOUS | @ 17:00:00

## 2022-11-20 MED ADMIN — HYDROmorphone (PF) (DILAUDID) injection 0.5 mg: .5 mg | INTRAVENOUS | @ 12:00:00 | Stop: 2022-11-24

## 2022-11-20 NOTE — Unmapped (Signed)
Recreational Therapy Evaluation  11/20/2022    Patient Name:  [name] [name]       Medical Record Number: [id number]   Date of Birth: 2000-08-23  Sex: Female          Room/Bed:  8A416/6A630-16    Eval Duration: 40 Min.    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 Pt was admitted to [facility] on 09/25/2022 for ileus, inability to tolerate PO now on supplemental tube feed, and diffuse pain due to muscle spasms of uncertain etiology. Pt has participated in acute inpatient physical and occupational therapies to improve functional mobility, activity tolerance, functional strength, balance, and endurance in order to facilitate safe performance of ADLs and daily routines. She is now admitted to [facility] for comprehensive interdisciplinary rehabilitation. LRT provided education re: RT tx services in the AIR setting to support her recovery process promoting increased activity tolerance, community-reintegration training, and leisure support services. Pt able to identify her motivators as, Returning to [place] her goals of completing school and living out her young adulthood and identified several leisure interests that have assisted in her with extended hospitalization; please see full assessment for details.    Plan of Care  1x per day for: 1-2x week     Motivators: Able to Identify motivators  Patient's Motivators: Family  Patient's Identified Treatment Goal: Get better and continue working toward my goals  Patient's Stressors / Triggers: prolonged hospitalization  Treatment Plan developed in collaboration with: Patient, Family, Treatment Team  Interventions: Adjustment to hospitalization, Health Care Encounters and Medical Condition, Arts and Crafts, [employer] reintegration, Pharmacologist, Discharge planning, Education - Patient, Education - Family / caregiver, Expressive arts, Goal setting, Public house manager, Leisure, Management consultant, Wellness / recovery     Goals:  Within 4 tx sessions, pt will engage in 2 leisure/wellness pursuits >20 minutes to promote increased activity tolerance.  Prior to d/c, after education, pt/family will identify at least 2 community resources to utilize for ongoing support post d/c.    Interventions: Adjustment to hospitalization, Health Care Encounters and Medical Condition, Arts and Crafts, Community reintegration, Coping skills, Discharge planning, Education - Patient, Education - Family / caregiver, Expressive arts, Goal setting, Public house manager, Leisure, Management consultant, Wellness / recovery     Subjective    Current Situation: Pt received in her room sitting in recliner with her mother present, upon LRT arrival.  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 Pt aspires to be a pediatric oncology nurse.)  Living Environment: House  Lives With: Mother, Father, Sibling(s)  Precautions: Falls precautions, Aspiration precautions  Equipment / Environment: Patient not wearing mask for full session, Caregiver wearing mask for full session  Reports/displays signs/symptoms of pain?: No  Add'l Session Information: Family/Caregiver present, RN aware of RT tx session    Past Medical History:   Diagnosis Date    Abdominal pain     Acid reflux     occas    Anesthesia complication     itching, shaking, coldness; last few surgeries have gone much better    Cancer (CMS-HCC)     Cataract of right eye     COVID-19 virus infection 01/2019  Cyst of thyroid determined by ultrasound     monitoring    [name]     2 right forearm, 1 left thigh, 1 right scapula, 1 under left clavicle; multiple    Difficult intravenous access     [name] (familial adenomatous polyposis)     [name]     Gastric polyps     History of chemotherapy     last treatment approx 05/2019    History of colon polyps     History of COVID-19 01/2019    Iron deficiency anemia due to chronic blood loss     received iron infusion 11-2019    PONV (postoperative nausea and vomiting)     Rectal bleeding     Syncopal episodes     especially if becoming dehydrated    Social History     Tobacco Use    Smoking status: Never     Passive exposure: Past    Smokeless tobacco: Never   Substance Use Topics    Alcohol use: Never      Past Surgical History:   Procedure Laterality Date    COLON SURGERY      cyroablation      cystis removal      desmoid removal      PR CLOSE ENTEROSTOMY,RESEC+ANAST N/A 10/09/2020    Procedure: ILEOSTOMY TAKEDOWN;  Surgeon: Mickle Asper, MD;  Location: OR [place];  Service: General Surgery    PR COLONOSCOPY W/BIOPSY SINGLE/MULTIPLE N/A 10/27/2012    Procedure: COLONOSCOPY, FLEXIBLE, PROXIMAL TO SPLENIC FLEXURE; WITH BIOPSY, SINGLE OR MULTIPLE;  Surgeon: Shirlyn Goltz Mir, MD;  Location: PEDS PROCEDURE ROOM [place];  Service: Gastroenterology    PR COLONOSCOPY W/BIOPSY SINGLE/MULTIPLE N/A 09/14/2013    Procedure: COLONOSCOPY, FLEXIBLE, PROXIMAL TO SPLENIC FLEXURE; WITH BIOPSY, SINGLE OR MULTIPLE;  Surgeon: Shirlyn Goltz Mir, MD;  Location: PEDS PROCEDURE ROOM [place];  Service: Gastroenterology    PR COLONOSCOPY W/BIOPSY SINGLE/MULTIPLE N/A 11/08/2014    Procedure: COLONOSCOPY, FLEXIBLE, PROXIMAL TO SPLENIC FLEXURE; WITH BIOPSY, SINGLE OR MULTIPLE;  Surgeon: Arnold Long Mir, MD;  Location: PEDS PROCEDURE ROOM [place] [facility];  Service: Gastroenterology    PR COLONOSCOPY W/BIOPSY SINGLE/MULTIPLE N/A 12/26/2015    Procedure: COLONOSCOPY, FLEXIBLE, PROXIMAL TO SPLENIC FLEXURE; WITH BIOPSY, SINGLE OR MULTIPLE;  Surgeon: Arnold Long Mir, MD;  Location: PEDS PROCEDURE ROOM [facility];  Service: Gastroenterology PR COLONOSCOPY W/BIOPSY SINGLE/MULTIPLE N/A 09/02/2017    Procedure: COLONOSCOPY, FLEXIBLE, PROXIMAL TO SPLENIC FLEXURE; WITH BIOPSY, SINGLE OR MULTIPLE;  Surgeon: Arnold Long Mir, MD;  Location: PEDS PROCEDURE ROOM [place];  Service: Gastroenterology    PR COLSC FLX W/REMOVAL LESION BY HOT BX FORCEPS N/A 08/27/2016    Procedure: COLONOSCOPY, FLEXIBLE, PROXIMAL TO SPLENIC FLEXURE; W/REMOVAL TUMOR/POLYP/OTHER LESION, HOT BX FORCEP/CAUTE;  Surgeon: Arnold Long Mir, MD;  Location: PEDS PROCEDURE ROOM [facility];  Service: Gastroenterology    PR COLSC FLX W/RMVL OF TUMOR POLYP LESION SNARE TQ N/A 02/25/2019    Procedure: COLONOSCOPY FLEX; W/REMOV TUMOR/LES BY SNARE;  Surgeon: Helyn Numbers, MD;  Location: GI PROCEDURES [place] [facility];  Service: Gastroenterology    PR COLSC FLX W/RMVL OF TUMOR POLYP LESION SNARE TQ N/A 03/13/2020    Procedure: COLONOSCOPY FLEX; W/REMOV TUMOR/LES BY SNARE;  Surgeon: Helyn Numbers, MD;  Location: [facility] PROCEDURES [place] [facility];  Service: Gastroenterology    PR EXC SKIN BENIG 2.1-3 CM TRUNK,ARM,LEG Right 02/25/2017    Procedure: EXCISION, BENIGN LESION INCLUDE MARGINS, EXCEPT SKIN TAG, LEGS; EXCISED DIAMETER 2.1 TO 3.0 CM;  Surgeon: Clarene Duke, MD;  Location: CHILDRENS OR [place];  Service: Plastics    PR EXC SKIN BENIG 3.1-4 CM TRUNK,ARM,LEG Right 02/25/2017    Procedure: EXCISION, BENIGN LESION INCLUDE MARGINS, EXCEPT SKIN TAG, ARMS; EXCISED DIAMETER 3.1 TO 4.0 CM;  Surgeon: Clarene Duke, MD;  Location: CHILDRENS OR [facility];  Service: Plastics    PR EXC SKIN BENIG >4 CM FACE,FACIAL Right 02/25/2017    Procedure: EXCISION, OTHER BENIGN LES INCLUD MARGIN, FACE/EARS/EYELIDS/NOSE/LIPS/MUCOUS MEMBRANE; EXCISED DIAM >4.0 CM;  Surgeon: Clarene Duke, MD;  Location: CHILDRENS OR [facility];  Service: Plastics    PR EXC TUMOR SOFT TISSUE LEG/ANKLE SUBQ 3+CM Right 08/05/2019    Procedure: EXCISION, TUMOR, SOFT TISSUE OF LEG OR ANKLE AREA, SUBCUTANEOUS; 3 CM OR GREATER;  Surgeon: Arsenio Katz, MD;  Location: MAIN OR [place];  Service: Plastics    PR EXC TUMOR SOFT TISSUE LEG/ANKLE SUBQ <3CM Right 08/05/2019    Procedure: EXCISION, TUMOR, SOFT TISSUE OF LEG OR ANKLE AREA, SUBCUTANEOUS; LESS THAN 3 CM;  Surgeon: Arsenio Katz, MD;  Location: MAIN OR [facility];  Service: Plastics    PR LAP, SURG PROCTECTOMY W J-POUCH N/A 08/10/2020    Procedure: ROBOTIC ASSISTED LAPAROSCOPIC PROCTOCOLECTOMY, ILEAL J POUCH, WITH OSTOMY;  Surgeon: Mickle Asper, MD;  Location: OR [place];  Service: General Surgery    PR NDSC EVAL INTSTINAL POUCH DX W/COLLJ SPEC SPX N/A 01/23/2021    Procedure: ENDO EVAL SM INTEST POUCH; DX;  Surgeon: Modena Nunnery, MD;  Location: [facility] PROCEDURES [place] [facility];  Service: Gastroenterology    PR NDSC EVAL INTSTINAL POUCH DX W/COLLJ SPEC SPX N/A 08/27/2021    Procedure: ENDO EVAL SM INTEST POUCH; DX;  Surgeon: Hunt Oris, MD;  Location: GI PROC[facility]URES [facility];  Service: Gastroenterology    PR NDSC EVAL INTSTINAL POUCH DX W/COLLJ SPEC SPX N/A 12/09/2021    Procedure: ENDO EVAL SM INTEST POUCH; DX;  Surgeon: Vidal Schwalbe, MD;  Location: GI PROCEDURES [facility];  Service: Gastroenterology    PR NDSC EVAL INTSTINAL POUCH DX W/COLLJ [facility] SPX Left 04/09/2022    Procedure: ENDO EVAL SM INTEST POUCH; DX;  Surgeon: Modena Nunnery, MD;  Location: [facility] PROCEDURES [place] [facility];  Service: Gastroenterology    PR NDSC EVAL INTSTINAL POUCH DX W/COLLJ SPEC SPX N/A 08/05/2022    Procedure: ENDO EVAL SM INTEST POUCH; DX;  Surgeon: Modena Nunnery, MD;  Location: GI PROCEDURES [facility];  Service: Gastroenterology    PR NDSC EVAL INTSTINAL POUCH W/BX SINGLE/MULTIPLE N/A 01/20/2022    Procedure: ENDOSCOPIC EVAL OF SMALL INTESTINAL POUCH; DIAGNOSTIC, No biopsies;  Surgeon: Andrey Farmer, MD;  Location: GI PROCEDURES [facility];  Service: Gastroenterology    PR NDSC EVAL INTSTINAL POUCH W/BX SINGLE/MULTIPLE N/A 02/13/2022 Procedure: ENDOSCOPIC EVAL OF SMALL INTESTINAL POUCH; DIAGNOSTIC, WITH BIOPSY;  Surgeon: Bronson Curb, MD;  Location: GI PROCEDURES [facility];  Service: Gastroenterology    PR UNLISTED PROCEDURE SMALL INTESTINE  01/23/2021    Procedure: UNLISTED PROCEDURE, SMALL INTESTINE;  Surgeon: Modena Nunnery, MD;  Location: [facility] PROCEDURES [place] [facility];  Service: Gastroenterology    PR UNLISTED PROCEDURE SMALL INTESTINE  02/13/2022    Procedure: UNLISTED PROCEDURE, SMALL INTESTINE;  Surgeon: Bronson Curb, MD;  Location: GI PROCEDURES [facility];  Service: Gastroenterology    PR UPPER GI ENDOSCOPY,BIOPSY N/A 10/27/2012    Procedure: UGI ENDOSCOPY; WITH BIOPSY, SINGLE OR MULTIPLE;  Surgeon: Shirlyn Goltz Mir, MD;  Location: PEDS PROCEDURE ROOM [facility];  Service: Gastroenterology    PR UPPER  GI ENDOSCOPY,BIOPSY N/A 09/14/2013    Procedure: UGI ENDOSCOPY; WITH BIOPSY, SINGLE OR MULTIPLE;  Surgeon: Shirlyn Goltz Mir, MD;  Location: PEDS PROCEDURE ROOM [facility];  Service: Gastroenterology    PR UPPER GI ENDOSCOPY,BIOPSY N/A 11/08/2014    Procedure: UGI ENDOSCOPY; WITH BIOPSY, SINGLE OR MULTIPLE;  Surgeon: Arnold Long Mir, MD;  Location: PEDS PROCEDURE ROOM [facility];  Service: Gastroenterology    PR UPPER GI ENDOSCOPY,BIOPSY N/A 12/26/2015    Procedure: UGI ENDOSCOPY; WITH BIOPSY, SINGLE OR MULTIPLE;  Surgeon: Arnold Long Mir, MD;  Location: PEDS PROCEDURE ROOM [facility];  Service: Gastroenterology    PR UPPER GI ENDOSCOPY,BIOPSY N/A 08/27/2016    Procedure: UGI ENDOSCOPY; WITH BIOPSY, SINGLE OR MULTIPLE;  Surgeon: Arnold Long Mir, MD;  Location: PEDS PROCEDURE ROOM [facility];  Service: Gastroenterology    PR UPPER GI ENDOSCOPY,BIOPSY N/A 09/02/2017    Procedure: UGI ENDOSCOPY; WITH BIOPSY, SINGLE OR MULTIPLE;  Surgeon: Arnold Long Mir, MD;  Location: PEDS PROCEDURE ROOM [facility];  Service: Gastroenterology    PR UPPER GI ENDOSCOPY,BIOPSY N/A 03/13/2020    Procedure: UGI ENDOSCOPY; WITH BIOPSY, SINGLE OR MULTIPLE;  Surgeon: Helyn Numbers, MD;  Location: GI PROCEDURES [place] [facility];  Service: Gastroenterology    PR UPPER GI ENDOSCOPY,BIOPSY N/A 09/05/2021    Procedure: UGI ENDOSCOPY; WITH BIOPSY, SINGLE OR MULTIPLE;  Surgeon: Wendall Papa, MD;  Location: GI PROCEDURES [facility];  Service: Gastroenterology    PR UPPER GI ENDOSCOPY,DIAGNOSIS N/A 01/20/2022    Procedure: UGI ENDO, INCLUDE ESOPHAGUS, STOMACH, & DUODENUM &/OR JEJUNUM; DX W/WO COLLECTION SPECIMN, BY BRUSH OR WASH;  Surgeon: Andrey Farmer, MD;  Location: GI PROCEDURES [facility];  Service: Gastroenterology    TUMOR REMOVAL      multiple-head, neck, back, hand, right flank, multiple    Family History   Problem Relation Age of Onset    No Known Problems Mother     No Known Problems Father     No Known Problems Sister     No Known Problems Brother     Stroke Maternal Grandmother     Other Maternal Grandmother         benign lesions of liver and pancreas, further details unknown    Cancer Maternal Grandmother     Diabetes Maternal Grandmother     Hypertension Maternal Grandmother     Thyroid disease Maternal Grandmother     Arthritis Maternal Grandfather     Asthma Maternal Grandfather     COPD Paternal Grandmother         Deceased    Miscarriages / Stillbirths Paternal Grandmother     Alcohol abuse Paternal Grandfather         Deceased    No Known Problems Maternal Aunt     No Known Problems Maternal Uncle     No Known Problems Paternal Aunt     No Known Problems Paternal Uncle     Anesthesia problems Neg Hx     Broken bones Neg Hx     Cancer Neg Hx     Clotting disorder Neg Hx     Collagen disease Neg Hx     Diabetes Neg Hx     Dislocations Neg Hx     Fibromyalgia Neg Hx     Gout Neg Hx     Hemophilia Neg Hx     Osteoporosis Neg Hx     Rheumatologic disease Neg Hx     Scoliosis Neg Hx     Severe sprains Neg Hx  Sickle cell anemia Neg Hx     Spinal Compression Fracture Neg Hx     Melanoma Neg Hx     Basal cell carcinoma Neg Hx     Squamous cell carcinoma Neg Hx Allergies: Adhesive tape-silicones; Ferrlecit [sodium ferric gluconat-sucrose]; Levofloxacin; Methylnaltrexone; Neomycin; Papaya; Morphine; Zosyn [piperacillin-tazobactam]; Compazine [prochlorperazine]; Iron analogues; Iron dextran; and Latex, natural rubber     Objective    Cognitive  Stage of change / level of insight: Preparation  Thought Process/Content: Intact  Attention Span/Alertness : Able to attend to RT assessment  Medical understanding: Would benefit from additional dx/tx education', Understands long term implications of diagnosis and/or treatment, Knows name of diagnosis and/or medical equipment, Able to explain treatments/procedures related to medical condition  Orientation: Fully oriented  Additional Cognitive Domain Comment: Pt cognitively intact    Communication  Communication Barriers: None noted    Emotional  Mood: Pleasant, Hopeful  Affect: Congruent  Anxiety Management : Reports anxiety that is situational  Pain Management : Reports pain that is situational  Frustration Tolerance: Reports frustration that is situational  Coping Skills : Requires resources/assistance to utilize coping strategies, Reports independent practice of healthy coping strategies  Motivated to learn new coping strategies?: Yes  Adjustment: Willing to learn ways to compensate  Body Image/Self concept: Manages with coping skills/support  Emotional Expression: Independently expresses feelings  Additional Emotional Domain comments: Pt endorsed she is coping well at this time, however it's difficult re: extended hospitalization as she aspires to continue working toward achieving her degree in nursing and child life, and engage in preferred leisure activities.    Physical Domain  Mobility Comments: Please refer to full PT/OT evaluation.  Upper extremity function: Per OT, Right Hand Function: Right hand function impaired , Right Hand Impairment: grip strength poor, coordination impaired ; Left Hand Function: Left hand function impaired , Left Hand Impairment: grip strength fair, coordination impaired  Lower extremity function: Per PT, RLE Impairment: Reduced strength, Limited AROM, Limited PROM, Pain with movement ; LLE Impairment: Reduced strength, Limited AROM, Limited PROM    Social  Support system: Reports positive support system  Ability to form relationship / interact with others: Able to form social relationships independently  Additional Social Domain Comments: Pt lives in [place], [place]) with her family. Pt voices having a good support system, however would like to meet someone her age to have for peer support.     Leisure and Life Function  Level of involvement: Occasional participation  Firefighter / barrier education: Unable to return to community at this time  Motivation to engage in leisure / play: Yes  Quality of participation: Involved in healthy leisure though dissatisfied with barriers  Additional Leisure and Life Function Comments: Pt enjoys leisure activities of thrift shopping, reading, playing pickleball, arts/crafts, music, visiting amusement parks, singing, watching television, and spending time with her dog.    I attest that I have reviewed the above information.  Signed by Alfonso Ramus, LRT/CTRS   Filed 11/20/2022

## 2022-11-20 NOTE — Unmapped (Addendum)
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 Admitted to [facility] on 09/25/2022 for ileus, inability to tolerate PO now on supplemental tube feed, and diffuse pain due to muscle spasms of uncertain etiology.      Rehab Impairment Group Code [facility]): (Debility) 16 Debility (Non-Cardiac/Non-Pulmonary)  Etiology: Diffuse pain due to muscle spasms of uncertain etiology and cutaneous desmoid tumors    PLAN:     This patient is admitted to the Physical Medicine and Rehabilitation - Inpatient - C service from 8am-5pm on weekdays for questions regarding this patient. After hours, weekends, and holidays please contact the 1st Call resident pager     REHAB:   - PT and OT to maximize functional status with mobility and ADLs as well as prevention of joint contracture.   - Neuropsych for higher level cognitive evaluation and coping.  - RT for community re-integration, education, and leisure support services.  - Nutrition consult for diet information/teaching.   - To be discussed in weekly Interdisciplinary Team Conference.     Increased Tonicity  Dystonia   Started post-procedurally per patient ~09/25/22. Occurs intermittently throughout the day with a heightened occurrence during mobilization. At rest patient bilateral ankles in plantarflexion with worsening heel cord tightening. Has tonicity most prominent to HF and KE that is not velocity dependent. Has not noted significant improvement with increase of baclofen to 15 mg TID in setting of infection as below. Neurology previously evaluated patient seemingly ruling out an acute dystonic reaction, cord compression, central process, nutritional deficiency, stiff person syndrome and [name] after normal EMG on 8/22. Continues to be well controlled with valium, small improvement with baclofen PRN.  - Continue Baclofen 10 mg TID prn  - Continued Valium 2.5/2.5/5 mg TID and 2.5 mg nightly PRN  - PRAFO boots in bed.     Headache  Currently managed with tylenol. She does have some posterior neck muscle tightness. Notes ketamine is helpful for headache pains. Describes photophobia and phonophobia with events. I discussed with patient our hope is to decrease risk of rebound headaches and limit medical issues which will interfere with therapy. An additional thought is patient's headaches could be cervicogenic. She does have loss of cervical lordosis on most recent MRI.   - Given patient with elevated heart rate, frequent headaches, and has limited therapy participation 2/2 tachycardia in setting of continuous headaches  - Consider starting propranolol 5 mg BID for headache prophylaxis.   - Prn tylenol +/- prn fiorcet   - Lidocaine cream PRN to area of pain for cervicogenic headaches     Pain  Has a chronic pain provider. Pain management consulted with recommendations as below. Patient will need to follow up with outpatient pain provider ([name]).  - Tylenol 650 mg q6h PRN  - IV Dilaudid 0.5 mg q6h PRN. Will wean as tolerated.  - Oxyocodone 10-15 mg q4h PRN  - Suboxone 2-0.5 mg TID  - Continue Gabapentin 100 mg TID, can increase to 300mg  TID as tolerated  - Voltaren gel QID, lidocaine patches  - Continue patellar taping with therapy or obtain patellar brace to help with pain with ambulation     GI: Multifactorial Ileus   Coffee Ground Emesis 2/2 NGT placement (resolved)  Contributors with history of narcotics, poor  PO intake. Concern for malnutrition. Improvements to abdominal pain since weaning opioids. TPN has been discontinued with patient now tolerating tube feeds.  Working on decreasing tube feeds to tolerate p.o. intake  -Continue nightly Tube feeds as guided by nutrition.   -bowel regimen: Naloxegol daily  -Corpak NG tube in place  -Trialing food/meds PO in hopes of NG tube removal. Otherwise may discharge home with corpak  -PO Protonix 40 mg BID   -receiving IV phenergan for nausea. Previously on zofran but discontinued in setting of prolonged QTc. Consider scopolamine in hopes of transitioning off IV medications and removing central line, which is infection risk.  -restart home cefdinir and doxycycline upon discharge from acute rehab for SIBO      Syncope  Multifactorial. Primary team ddx includes: vasovagal vs adverse effect of medications vs deconditioning vs iron deficiency. Last echo 04/2022  - CTM     IDA  -has allergy to formula iron      [name] - Desmoid Tumors  Follows [facility] (Dr. [name]) is patient's outpatient oncologist.   -Continue nirogacestat 100 mg BID (will clarify increase nirogacestat to 150 mg BID on September 17 per dc summary)     Enterobacteriaceae group bacteremia (Resolved) - C albicans fungemia (Resolved)   Likely due to RIJ tunneled line placed 01/2022 now removed on 8/14.  8/13, 8/17 repeat blood cultures NGTD.  No evidence of vitreitis, retinitis 8/15 per Ophtho.  TTE 8/15 w possible vegetations on MV and TV or indwelling catheter; however, TEE 8/20 w/o vegetations. ID consulted with recommendations as below. Completed treatment with cephalosporin and fluconazole on 8/31.   - Remove R internal jugular access when able to wean off IV meds.     Generalized anxiety disorder, depression, h/o panic attacks  Psych consulted with recommendations as below.   - Consider decreasing mirtazapine 15 mg nightly to 7.5 mg nightly due to somnolence  - Valium as above, If needed could give 2.5mg  PRN for acute anxiety.     Daily Checklist  - Diet: Regular diet + nighttime tube feeds  - DVT PPX: SQ enoxaparin   - GI PPX: Protonix  - Access: R IJ non tunneled CVC. Necessary for IV medications; will remove when able.    DISPO: Patient to be discussed at weekly interdisciplinary team conference.   - EDD: TBD.   - Follow-up: PCP, Chronic Pain, Oncology (Dr. [name]), Psych    SUBJECTIVE and PROGRESS WITH FUNCTIONAL ACTIVITIES:     Interval Events: Patient with stable pain affecting shoulder, back, sacral area and hips. Grateful to be in rehab. Endorses stable nausea that's fairly continuous. Help with IV phenergan. Had used zofran odt 8mg  outpatient, but QTc noted to be prolonged. Has not tried scopolamine patch. Mother at bedside. Notes some swelling over sacrum. History of R scapular dyskinesis and pain. Therapy evaluations planned for today    OBJECTIVE:     Vital signs (last 24 hours):  Temp:  [36.4 ??C (97.5 ??F)-36.6 ??C (97.9 ??F)] 36.6 ??C (97.9 ??F)  Heart Rate:  [90-96] 96  Resp:  [16-19] 19  BP: (103-107)/(49-78) 103/49  MAP (mmHg):  [66-87] 66  SpO2:  [97 %-98 %] 98 %    Intake/Output (last 3 shifts):  I/O last 3 completed shifts:  In: 70 [I.V.:30; NG/GT:40]  Out: 875 [Urine:875]    Physical Exam:   GEN: NAD, resting comfortably in bed  EYES: sclera anicteric, conjunctiva clear   HEENT: corpak in place L nare. R I.J. in place (  nontunneled)  CV: warm extremities, well perfused, no cyanosis  RESP:  NWOB on room air.  ABD: Not distended  SKIN: no visible masses, lesions, rashes, ecchymoses.   MSK: sacrum with some soft tissue swelling near lumbosacral junction. Mild TTP in surrounding tissues. No erythema, bruising or skin breakdown  NEURO:Alert, speech fluent  PSYCH: mood, affect appropriate. Tearful discussing her course to rehab.      Medications:  Scheduled    buprenorphine-naloxone (SUBOXONE) 2-0.5 mg SL film 1 mg of buprenorphine TID    carboxymethylcellulose sodium (REFRESH CELLUVISC) 1 % ophthalmic gel 1 drop QID    diazePAM (VALIUM) tablet 2.5 mg Daily    diazePAM (VALIUM) tablet 5 mg Nightly    diclofenac sodium (VOLTAREN) 1 % gel 2 g QID    enoxaparin (LOVENOX) syringe 40 mg Q24H SCH    famotidine (PEPCID) tablet 20 mg Nightly    gabapentin (NEURONTIN) capsule 100 mg TID    lidocaine (ASPERCREME) 4 % 2 patch Daily mirtazapine (REMERON) tablet 15 mg Nightly    multivitamin with folic acid 400 mcg tablet 1 tablet Daily    naloxegol (MOVANTIK) 12.5 mg tablet 12.5 mg Daily    nirogacestat (OGSIVEO) tablet 100 mg **Patient Supplied** BID    pantoprazole (Protonix) EC tablet 40 mg BID     PRN acetaminophen, 650 mg, Q6H PRN  alum-mag-simeth, 60 mL, Q6H PRN  baclofen, 10 mg, TID PRN  bisacodyl, 10 mg, BID PRN  butalbital-acetaminophen-caffeine, 1 tablet, BID PRN  carboxymethylcellulose sodium, 2 drop, QID PRN  Chemo Clarification Order, , Continuous PRN  Chemo Clarification Order, , Continuous PRN  diazePAM, 2.5 mg, Nightly PRN  guaiFENesin, 200 mg, Q4H PRN  hydrocortisone, , BID PRN  HYDROmorphone, 0.5 mg, Q6H PRN  IP okay to treat, , Continuous PRN  lidocaine-diphenhydrAMINE-aluminum-magnesium, 10 mL, QID PRN  ondansetron, 4 mg, Q12H PRN  oxyCODONE, 10 mg, Q4H PRN   Or  oxyCODONE, 15 mg, Q4H PRN  phenol, 2 spray, Q2H PRN  polyethylene glycol, 17 g, BID PRN  promethazine, 6.5 mg, Q6H PRN  pseudoephedrine, 30 mg, Daily PRN  senna, 1 tablet, BID PRN  zinc oxide-cod liver oil, , Daily PRN        Labs/Studies: Reviewed.    Radiology Results: Reviewed    Quality Indicators      Hearing, Speech, and Vision  Ability to Hear: Adequate  Ability to See in Adequate Light: Adequate  Expression of Ideas and Wants: Without difficulty  Understanding Verbal and Non-Verbal Content: Understands    Cognitive Pattern Assessment  Cognitive Pattern Assessment Used: BIMS  Brief Interview for Mental Status (BIMS)  Repetition of Three Words (First Attempt): 3  Temporal Orientation: Year: Correct  Temporal Orientation: Month: Accurate within 5 days  Temporal Orientation: Day: Correct  Recall: Sock: Yes, no cue required  Recall: Blue: Yes, no cue required  Recall: Bed: Yes, no cue required  BIMS Summary Score: 15       Nutritional Approaches  Nutritional Approach: Feeding tube    ADLs  Admission Current   Eating Assistance Needed: Set-up / clean-up CARE Score - 5 Assistance Needed: Set-up / clean-up    CARE Score - 5   Oral Hygiene  Assistance Needed: Set-up / clean-up    CARE Score - 5  Assistance Needed: Set-up / clean-up    CARE Score - 5   Bladder Continence       Bowel Continence       Toileting Hygiene Assistance Needed: Physical assistance Total  assistance  CARE Score - 1 Assistance Needed: Physical assistance Total assistance  CARE Score - 1    Toilet Transfer      CARE Score - 88      CARE Score - 88   Shower/Bathe Self      CARE Score - 88       CARE Score - 88   Upper Body Dressing Assistance Needed: Physical assistance 26%-50%  CARE Score - 3  Assistance Needed: Physical assistance 26%-50%  CARE Score - 3   Lower Body Dressing Assistance Needed: Physical assistance Total assistance  CARE Score - 1  Assistance Needed: Physical assistance Total assistance  CARE Score - 1   On/Off Footwear Assistance Needed: Physical assistance Total assistance  CARE Score - 1  Assistance Needed: Physical assistance Total assistance  CARE Score - 1         Transfers Admission Current   Bed to Chair      CARE Score -         CARE Score -       Lying to Sitting      CARE Score -         CARE Score -     Roll Left/Right      CARE Score -         CARE Score -     Sit to Lying      CARE Score -         CARE Score -     Sit to Stand      CARE Score -         CARE Score -           Mobility Admission Current   Walk 10 Feet      CARE Score -        CARE Score -       Walk 50 Feet 2 Turns      CARE Score -        CARE Score -     Walk 150 Feet      CARE Score -         CARE Score -     Walk 10 Feet Uneven      CARE Score -         CARE Score -     1 Step (Curb)      CARE Score -         CARE Score -     4 Steps      CARE Score -         CARE Score -     12 Steps      CARE Score -         CARE Score -     Picking Up Object      CARE Score -         CARE Score -     Wheelchair/Scooter Use        Wheel 50 Feet 2 Turns                               Wheel 150 Feet

## 2022-11-20 NOTE — Unmapped (Signed)
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 [name] [name] was admitted to [name] on 09/25/2022 for ileus, inability to tolerate PO now on supplemental tube feed, and diffuse pain due to muscle spasms of uncertain etiology. [name] [name] has participated in acute inpatient physical and occupational therapies to improve functional mobility, activity tolerance, functional strength, balance, and endurance in order to facilitate safe performance of ADLs and daily routines. She is stable and awaiting rehab placement. She is now admitted to [name] for comprehensive interdisciplinary rehabilitation.      Rehab Impairment Group Code [facility]): (Debility) 16 Debility (Non-Cardiac/Non-Pulmonary)  Etiology: Diffuse pain due to muscle spasms of uncertain etiology and cutaneous desmoid tumors    Pre-Team Conference Note: Discharge Date is set for TBD, plan to discuss patient in team tomorrow. CM met with patient and her parents to complete initial assessment.  Patient's discharge address and phone number verified as correct in demographics. Patient lives in a 3 story home with 3 stairs to enter and has threshold step as well.  Her bedroom is on the 2nd floor.  Pt lives with her parents who are able to provide 24/7 supervision at home.  Patient has the following DME in place at home, Rollator provided by her grandmother. She has her TPN/Feeding supplies at home.  Patient is currently  open to [facility] for Home Infusion.   [name] is this patient's Insurance risk surveyor and will be picked up by [name] at discharge. She has a PCP and BCBS.     Care Manager provided patient and/or family with a  My Chart Brochure and review how to establish an account. CM will follow for discharge planning needs.       Type of Residence: Mailing Address:  [address] [place] [address]  Contacts: Extended Emergency Contact Information  Primary Emergency Contact: [name] [name] [place]  Home Phone: [phone]  Mobile Phone: [phone]  Relation: [name]  Secondary Emergency Contact: [name] [name],[name] [name]  Mobile Phone: [phone]  Relation: [name]    Patient Phone Number: [phone] (home) [phone] (work)          Medical Provider(s): [name] [name], [name] [name]  Reason for Admission: Admitting Diagnosis:  post-procedure pain  Past Medical History:   has a past medical history of Abdominal pain, Acid reflux, Anesthesia complication, Cancer (CMS-HCC), Cataract of right eye, COVID-19 virus infection (01/2019), Cyst of thyroid determined by ultrasound, [name], Difficult intravenous access, [name] (familial adenomatous polyposis), [name], Gastric polyps, History of chemotherapy, History of colon polyps, History of COVID-19 (01/2019), Iron deficiency anemia due to chronic blood loss, PONV (postoperative nausea and vomiting), Rectal bleeding, and Syncopal episodes.  Past Surgical History:   has a past surgical history that includes desmoid removal; cystis removal; Tumor removal; pr upper gi endoscopy,biopsy (N/A, 10/27/2012); pr colonoscopy w/biopsy single/multiple (N/A, 10/27/2012); pr upper gi endoscopy,biopsy (N/A, 09/14/2013); pr colonoscopy w/biopsy single/multiple (N/A, 09/14/2013); pr upper gi endoscopy,biopsy (N/A, 11/08/2014); pr colonoscopy w/biopsy single/multiple (N/A, 11/08/2014); pr upper gi endoscopy,biopsy (N/A, 12/26/2015); pr colonoscopy w/biopsy single/multiple (N/A, 12/26/2015); pr upper gi endoscopy,biopsy (N/A, 08/27/2016); pr colsc flx w/removal lesion by hot bx forceps (N/A, 08/27/2016); pr exc skin benig >4 cm face,facial (Right, 02/25/2017); pr exc skin benig 3.1-4 cm trunk,arm,leg (Right, 02/25/2017); pr exc skin benig 2.1-3 cm  trunk,arm,leg (Right, 02/25/2017); pr upper gi endoscopy,biopsy (N/A, 09/02/2017); pr colonoscopy w/biopsy single/multiple (N/A, 09/02/2017); pr colsc flx w/rmvl of tumor polyp lesion snare tq (N/A, 02/25/2019); pr exc tumor soft tissue leg/ankle subq 3+cm (Right, 08/05/2019); pr exc tumor soft tissue leg/ankle subq <3cm (Right, 08/05/2019); pr colsc flx w/rmvl of tumor polyp lesion snare tq (N/A, 03/13/2020); pr upper gi endoscopy,biopsy (N/A, 03/13/2020); cyroablation; pr lap, surg proctectomy w j-pouch (N/A, 08/10/2020); pr close enterostomy,resec+anast (N/A, 10/09/2020); pr ndsc eval intstinal pouch dx w/collj spec spx (N/A, 01/23/2021); pr unlisted procedure small intestine (01/23/2021); pr ndsc eval intstinal pouch dx w/collj spec spx (N/A, 08/27/2021); pr upper gi endoscopy,biopsy (N/A, 09/05/2021); pr ndsc eval intstinal pouch dx w/collj spec spx (N/A, 12/09/2021); pr upper gi endoscopy,diagnosis (N/A, 01/20/2022); pr ndsc eval intstinal pouch w/bx single/multiple (N/A, 01/20/2022); pr ndsc eval intstinal pouch w/bx single/multiple (N/A, 02/13/2022); pr unlisted procedure small intestine (02/13/2022); pr ndsc eval intstinal pouch dx w/collj spec spx (Left, 04/09/2022); Colon surgery; and pr ndsc eval intstinal pouch dx w/collj spec spx (N/A, 08/05/2022).   Previous admit date: 09/27/2022    Primary Insurance- Payor: BCBS / Plan: BCBS BLUE OPTIONS/PPO/ADV ([place] ONLY) / Product Type: *No Product type* /   Secondary Insurance - None  Preferred Pharmacy - [id number] [place],  - [address] AT [address] OF [facility] [address]  [name]  [name]  [phone] - [place], [place] - [address]  [facility]                     General  Care Manager assessed the patient by : In person interview with patient, Medical record review, In person interview with family, Discussion with Clinical Care team  Orientation Level: Oriented X4  Functional level prior to admission: Independent  Reason for referral: Discharge Planning    Contact/Decision Maker  Extended Emergency Contact Information  Primary Emergency Contact: Mcgowen,[name]   [place]  Home Phone: [phone]  Mobile Phone: [phone]  Relation: [name]  Secondary Emergency Contact: Thaw,[name] [name]  Mobile Phone: [phone]  Relation: [name]    Legal Next of Kin / Guardian / POA / Advance Directives     HCDM ([place] policy, no known patient preference): [name] [name],[name] [name]    HCDM, back-up (If primary HCDM is unavailable): [name] [name],[name] [name]    Advance Directive (Medical Treatment)  Does patient have an advance directive covering medical treatment?: Patient does not have advance directive covering medical treatment.  Reason patient does not have an advance directive covering medical treatment:: Patient needs follow-up to complete one.    Health Care Decision Maker [HCDM] (Medical & Mental Health Treatment)  Healthcare Decision Maker: HCDM documented in the HCDM/Contact Info section.  Information offered on HCDM, Medical & Mental Health advance directives:: Patient declined information.    Advance Directive (Mental Health Treatment)  Does patient have an advance directive covering mental health treatment?: Patient does not have advance directive covering mental health treatment.  Reason patient does not have an advance directive covering mental health treatment:: Patient does not wish to complete one at this time.    Readmission Information    Have you been hospitalized in the last 30 days?: Yes  Name of Hospital: [facility]  Were you being cared for at a skilled nursing facility:: No        Number of Days between previous discharge  and readmission date: 1-3 days    Type of Readmission: Planned Readmission    Patient Information  Lives with: Parent    Type of Residence: Private residence  Location/Detail: [address] [place], [place]    Support Systems/Concerns: Parent    Responsibilities/Dependents at home?: No    Home Care services in place prior to admission?: Yes  Type of Home Care services in place prior to admission: Home infusion  Current Home Care provider (Name/Phone #): [facility]    Outpatient/Community Resources in place prior to admission: Clinic  Agency detail (Name/Phone #): (PCP) [name] [name]    Equipment Currently Used at Home: nutrition supplies, medication pump, feeding device, other (see comments) (Rollator was given to her by her grandmother)  Current Radiation protection practitioner (Name/Phone #): [facility]    Financial Information    Need for financial assistance?: No    Social Determinants of Health  Social Determinants of Health were addressed in provider documentation.  Please refer to patient history.  Social Determinants of Health     Financial Resource Strain: Low Risk  (09/26/2022)    Overall Financial Resource Strain (CARDIA)     Difficulty of Paying Living Expenses: Not very hard   Internet Connectivity: Not on file   Food Insecurity: No Food Insecurity (09/26/2022)    Hunger Vital Sign     Worried About Running Out of Food in the Last Year: Never true     Ran Out of Food in the Last Year: Never true   Tobacco Use: Low Risk  (11/20/2022)    Patient History     Smoking Tobacco Use: Never     Smokeless Tobacco Use: Never     Passive Exposure: Past   Housing/Utilities: Low Risk  (09/26/2022)    Housing/Utilities     Within the past 12 months, have you ever stayed: outside, in a car, in a tent, in an overnight shelter, or temporarily in someone else's home (i.e. couch-surfing)?: No     Are you worried about losing your housing?: No     Within the past 12 months, have you been unable to get utilities (heat, electricity) when it was really needed?: No   Alcohol Use: Not on file   Transportation Needs: No Transportation Needs (11/19/2022)    PRAPARE - Transportation     Lack of Transportation (Medical): No Lack of Transportation (Non-Medical): No   Substance Use: Not on file   Health Literacy: Low Risk  (11/19/2022)    Health Literacy     : Never   Physical Activity: Not on file   Interpersonal Safety: Unknown (11/20/2022)    Interpersonal Safety     Unsafe Where You Currently Live: Not on file     Physically Hurt by Anyone: Not on file     Abused by Anyone: Not on file   Stress: Not on file   Intimate Partner Violence: Not on file   Depression: Not at risk (01/25/2022)    Received from [facility]    PHQ-2   Social Connections: Not on file       Complex Discharge Information    Is patient identified as a difficult/complex discharge?: No    Discharge Needs Assessment  Concerns to be Addressed: discharge planning    Clinical Risk Factors: New Diagnosis, Principal Diagnosis: Cancer, Stroke, COPD, Heart Failure, AMI, Pneumonia, Joint Replacment, Functional Limitations, Readmission < 72 Hours    Barriers to taking medications: No ([facility])    Prior overnight hospital  stay or ED visit in last 90 days: Yes    Anticipated Changes Related to Illness: none    Equipment Needed After Discharge: other (see comments) (TBD)    Discharge Facility/Level of Care Needs: other (see comments) ([facility]ume [facility] at discharge)    Readmission  Risk of Unplanned Readmission Score: UNPLANNED READMISSION SCORE: 35.97%  Predictive Model Details          36% (High)  Factor Value    Calculated 11/20/2022 08:04 36% Number of active inpatient medication orders 78     Risk of Unplanned Readmission Model 23% Number of hospitalizations in last year 7     6% ECG/EKG order present in last 6 months     5% Encounter of ten days or longer in last year present     5% Diagnosis of electrolyte disorder present     5% Imaging order present in last 6 months     4% Latest hemoglobin low (9.8 g/dL)     4% Number of ED visits in last six months 1     3% Diagnosis of deficiency anemia present     3% Active anticoagulant inpatient medication order present     3% Active corticosteroid inpatient medication order present     1% Future appointment scheduled     1% Age 22     1% Active ulcer inpatient medication order present     0% Current length of stay 0.579 days      Readmitted Within the Last 30 Days? (No if blank) Yes  Patient at risk for readmission?: Yes    Discharge Plan  Screen findings are: Discharge planning needs identified or anticipated (Comment).    Expected Discharge Date:     Expected Transfer from Critical Care:  (N/A)    Quality data for continuing care services shared with patient and/or representative?: Yes  Patient and/or family were provided with choice of facilities / services that are available and appropriate to meet post hospital care needs?: Yes   List choices in order highest to lowest preferred, if applicable. : [facility]ume [facility]    Initial Assessment complete?: Yes

## 2022-11-20 NOTE — Unmapped (Signed)
Adult Nutrition Assessment Note    Visit Type: Per Protocol  Reason for Visit: Per Admission Nutrition Screen (Adult), Enteral Nutrition    NUTRITION ASSESSMENT     Current nutrition therapy is appropriate and progressing toward meeting meeting nutritional needs at this time.   Patient appropriate for enteral nutrition given inability to meet nutritional needs via po intake.   Patient consuming around 300-400 kcal per day with food and snacks. Patient consumed two 110 kcal applesauce cups with meds daily  Patient concerned about fluid gain causing weight gain- relayed information to provider  Patient endorses fluid gain in abdomen and legs and feet    Malnutrition Assessment using AND/ASPEN or GLIM Clinical Characteristics:    Patient does not meet AND/ASPEN criteria for malnutrition at this time (11/20/22 1501)    Nutrition Focused Physical Exam:    Nutrition Evaluation  Overall Impressions: Nutrition-Focused Physical Exam not indicated due to lack of malnutrition risk factors. (11/20/22 1501)    Care plan:No, patient does not meet malnutrition criteria      NUTRITION INTERVENTIONS and RECOMMENDATION      Recommend continue Osmolite 1.5 Cal at goal rate 40 mL/hr cycled over 13 hrs. 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 [name] [name] was admitted to [facility] on 09/25/2022 for ileus, inability to tolerate PO now on supplemental tube feed, and diffuse pain due to muscle spasms of uncertain etiology. [name] [name] has participated in acute inpatient physical and occupational therapies to improve functional mobility, activity tolerance, functional strength, balance, and endurance in order to facilitate safe performance of ADLs and daily routines. She is stable and awaiting rehab placement. She is now admitted to [facility] for comprehensive interdisciplinary rehabilitation.     Anthropometric Data:  Height: 165.1 cm (5' 5)   Admission weight:    Last recorded weight:    IBW: 56.75 kg  Percent IBW:    BMI: Body mass index is 22.38 kg/m??.   Usual Body Weight:  Patient notes that she gained +30 lb while on home TPN; she does not want to maintain that weight    Weight history prior to admission:  Patient would like to prevent weight gain.  standing scale weight this morning per patient and patient's mother report- 140 lb  Wt Readings from Last 30 Encounters:   11/14/22 61 kg (134 lb 8 oz)   09/24/22 62.4 kg (137 lb 9.6 oz)   09/16/22 62 kg (136 lb 11.2 oz)   09/02/22 63 kg (139 lb)   08/05/22 62.1 kg (137 lb)   07/18/22 62.8 kg (138 lb 6.4 oz)   07/16/22 62.1 kg (137 lb)   07/14/22 62.9 kg (138 lb 9.6 oz)   06/15/22 63.6 kg (140 lb 3.4 oz)   06/06/22 60.3 kg (132 lb 14.4 oz)   05/02/22 61.8 kg (136 lb 4.8 oz)   04/27/22 62.9 kg (138 lb 10.7 oz)   04/23/22 61.9 kg (136 lb 6.4 oz)  04/22/22 63.4 kg (139 lb 12.4 oz)   04/09/22 59 kg (130 lb)   04/08/22 61.6 kg (135 lb 14.4 oz)   03/31/22 61.2 kg (135 lb)   03/26/22 62 kg (136 lb 11.2 oz)   02/28/22 60.3 kg (133 lb)   02/26/22 59.5 kg (131 lb 1.6 oz)   02/19/22 58.9 kg (129 lb 14.4 oz)   02/06/22 55.8 kg (123 lb)   01/29/22 57.4 kg (126 lb 9.6 oz)   01/28/22 53.8 kg (118 lb 8 oz)   01/20/22 51.3 kg (113 lb)   01/02/22 52.3 kg (115 lb 6.4 oz)   12/31/21 52.2 kg (115 lb 1.6 oz)   12/26/21 52.2 kg (115 lb)   12/19/21 50.7 kg (111 lb 12.8 oz)   12/03/21 51.6 kg (113 lb 12.8 oz)             Weight changes this admission: There were no vitals filed for this visit.     NUTRITIONALLY RELEVANT DATA Medications:   Nutritionally pertinent medications reviewed and evaluated for potential food and/or medication interactions.   Remeron, Pepcid, Multivitamin with folic acid, NS 0.9% infusion at 10 ml/hr, Zofran, Oxycodone    Labs:   Nutritionally pertinent labs reviewed.     Nutrition History:   November 20, 2022:   Prior to admission:  Patient followed by RD services at [facility] prior to admission. Notes below    09/09: - Enteral Access: NG ;  - TF Pump check: 2153 mL x 72 hours per pump history (138% of ordered, meeting 70% of estimated needs) ; 602 mL x24 hours per pump history (115% of ordered).   - PO check: Pt reports minimal intake of meals. She had not eaten breakfast at the time of RD interview ; her mom is still planning to provide fairlife protein drinks however she has not received them yet. She continues to decline Ensure products in the meantime.    - Last BM: increasing frequency of BM noted with documentation ; x4 09/08 ; + colostomy  - Emesis: None documented ;     09/06: Patient endorses eating more food, small amounts throughout the day, but is too full to eat any more. MD consulted for potential reduction of tube feeds to promote better PO intake. Order in for Osmolite 1.5 at 50 ml/hr although patient receiving Osmolite 1.5 at 40 ml/hr- current rate would meet 84% of estimated energy needs. Patient would like to get back to UBW that feels comfortable for her which is 120 lb. Discussed reducing feeds with patient and mother. Per pump check, patient received 1445 ml formula over 72 hours- 57% of goal rate. Last BM x1 09/06.     09/04: Patient had diet advancement to a regular diet on 08/21, and has been receiving Ensure Plus BID. Additionally patient continues to have an order for enteral nutrition via NG tube of Osmolite 1.5 @ 50 goal rate, but has been receiving 40 mL/hr. Patient has been experiencing nausea. Po intake has been poor at this time. Last BM 09/04.      8/28: Visited at bedside with dietetic intern. 72 hour pump check shows 107% nutrition provision in the past 72 hours - ongoing some nausea, but generally tolerate ok. She is taking a few bites of PO, some PO fluids as well (lactose free milk while I was in room). Multiple questions. Feels she needs IVF, notified MD. She is meeting her nutritional needs w/ EN + PO.  Current Nutrition:  Oral intake   Enteral nutrition via G tube     Nutrition Orders            Osmolite 1.5 (Standard 1.5 Cal) cyclic tube feed starting at 09/11 1900    Nutrition Therapy Regular/House starting at 09/11 1816            Nutritional Needs:   Daily Estimated Nutrient Needs:  Energy: 1500-1800 kcals 25-30 kcal/kg using  (estimated UBW), 60 kg (11/20/22 1450)]  Protein: 72-90 gm [1.2-1.5 gm/kg using last recorded weight, 60 kg (11/20/22 1450)]  Carbohydrate:   [ ]   Fluid:   mL [1 mL/kcal (maintenance)]    Allergies, Intolerances, Sensitivities, and/or Cultural/Religious Dietary Restrictions: include papaya    GOALS and EVALUATION     Patient to meet 75% of estimated energy needs with combination of enteral nutrition and PO intake - New    Motivation, Barriers, and Compliance:  Evaluation of motivation, barriers, and compliance pending at this time due to clinical status.       Discharge Planning:   Monitor for potential discharge needs with multi-disciplinary team.         Alesia Morin, RD, LDN

## 2022-11-20 NOTE — Unmapped (Signed)
OCCUPATIONAL THERAPY  Evaluation (11/20/22 0800)      Patient Name:  [name] [name]      Medical Record Number: [id number]   Date of Birth: [birth year]  Gender: Female         Post-Discharge Occupational Therapy Recommendations      OT Post Acute Discharge Recommendations: TBD     OT DME Recommendations: To Be Determined      Barriers to Discharge: Home environment barriers, Mobility deficits, Self-Care deficits     Assessment/Session Summary     Treatment Diagnosis: Patient presenting with decreased ability to complete self care skills and functional transfers secondary to BLE rigidity/stiffness, decreased BUE strength, decreased BUE AROM, decreased functional activity tolerance, decreased static/dynamic sitting/standing balance, and current medical status.     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 [name] [name] was admitted to [facility] on 09/25/2022 for ileus, inability to tolerate PO now on supplemental tube feed, and diffuse pain due to muscle spasms of uncertain etiology. [name] [name] has participated in acute inpatient physical and occupational therapies to improve functional mobility, activity tolerance, functional strength, balance, and endurance in order to facilitate safe performance of ADLs and daily routines. She is stable and awaiting rehab placement. She was admitted to [facility] for comprehensive interdisciplinary rehabilitation. Patient presenting with decreased ability to complete self care skills and functional transfers secondary to BLE rigidity/stiffness, decreased BUE strength, decreased BUE AROM, decreased functional activity tolerance, decreased static/dynamic sitting/standing balance, and current medical status. At this time, patient would benefit from skilled OT services for improved independence in self care skills and functional transfers. 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  Assessment of Occupational Performance : Balance, Dexterity, Endurance, Fine or gross motor coordination, Mobility, Sensation, Strength        Clinical Decision Making: Moderate Complexity     Today's Interventions: ADL retraining, Bed mobility, Education - Patient, Safety education, Education - Family / caregiver, Functional mobility  Today's Interventions: OT evaluation; ADL training; AIR orientation    Patient state at end of session: Patient agreeable to OT; seated upright in bed with call light within reach at end of session.    Subjective      Prognosis: Good     Positive Indicators: Age; support system; motivated to participate     Prior Level of Function and Home Situation     Prior functional status: Per Chart Review: Pt/mother report that at pt's baseline, she is able to ambulate with no device, and took a trip to the beach a few weeks ago, although does require activity modification due to fatigue and some weakness. She was able to ambulate on the sand and also able to play mini-golf but unable to bend down to grab a ball. Since her egg retrieval and the initiation  of her new chemo medication, she has been spending most of her time in bed. No falls in the past month, but did have a few falls last hospitalization due to syncope. Pt reported increased difficulty with mobility following out patient procedure, increased fatigue, rigidity and weakness. Patient reporting she was able to complete basic ADLs and functional transfers needing increased time and without assistance prior to hospitalization.     Service Patient receives prior to hospitalization: NONE         Patient / Caregiver reports: I'm getting emotional. I'm just so happy to be here and to work.     Living Situation  Living Environment: House  Lives With: Father, Mother  Home Living: 1/2 bath on main level, Multi-level home, Bed/bath upstairs, Stairs to alternate level with rails, Standard height toilet, Tub/shower unit (tub/shower with curtain)  Number of Stairs to Enter (outside): 3 (2 STE with one threshold up into door)  Rail placement (inside): Rail on right side  Number of Stairs to Alternate level (inside): 14  Caregiver Identified?: Yes  Caregiver Availability: 24 hours  Caregiver Ability: Limited lifting     Equipment available at home: Rollator     Vitals/Orthostatics: Patient on 2L during session ranging from 94% - 99%; HR 92-104 bpm at rest and with activity     Past Medical History:   Diagnosis Date    Abdominal pain     Acid reflux     occas    Anesthesia complication     itching, shaking, coldness; last few surgeries have gone much better    Cancer (CMS-HCC)     Cataract of right eye     COVID-19 virus infection 01/2019    Cyst of thyroid determined by ultrasound     monitoring    [name]     2 right forearm, 1 left thigh, 1 right scapula, 1 under left clavicle; multiple    Difficult intravenous access     [name] (familial adenomatous polyposis)     [name]     Gastric polyps     History of chemotherapy     last treatment approx 05/2019    History of colon polyps     History of COVID-19 01/2019    Iron deficiency anemia due to chronic blood loss     received iron infusion 11-2019    PONV (postoperative nausea and vomiting)     Rectal bleeding     Syncopal episodes     especially if becoming dehydrated      Social History     Tobacco Use    Smoking status: Never     Passive exposure: Past    Smokeless tobacco: Never   Substance Use Topics    Alcohol use: Never     Past Surgical History:   Procedure Laterality Date    COLON SURGERY      cyroablation      cystis removal      desmoid removal      PR CLOSE ENTEROSTOMY,RESEC+ANAST N/A 10/09/2020    Procedure: ILEOSTOMY TAKEDOWN;  Surgeon: Mickle Asper, MD;  Location: OR [place];  Service: General Surgery    PR COLONOSCOPY W/BIOPSY SINGLE/MULTIPLE N/A 10/27/2012    Procedure: COLONOSCOPY, FLEXIBLE, PROXIMAL TO SPLENIC FLEXURE; WITH BIOPSY, SINGLE OR MULTIPLE;  Surgeon: Shirlyn Goltz Mir, MD;  Location: PEDS PROCEDURE ROOM [facility];  Service: Gastroenterology    PR COLONOSCOPY W/BIOPSY SINGLE/MULTIPLE N/A 09/14/2013    Procedure: COLONOSCOPY, FLEXIBLE, PROXIMAL TO SPLENIC FLEXURE; WITH  BIOPSY, SINGLE OR MULTIPLE;  Surgeon: Shirlyn Goltz Mir, MD;  Location: PEDS PROCEDURE ROOM [facility];  Service: Gastroenterology    PR COLONOSCOPY W/BIOPSY SINGLE/MULTIPLE N/A 11/08/2014    Procedure: COLONOSCOPY, FLEXIBLE, PROXIMAL TO SPLENIC FLEXURE; WITH BIOPSY, SINGLE OR MULTIPLE;  Surgeon: Arnold Long Mir, MD;  Location: PEDS PROCEDURE ROOM [facility];  Service: Gastroenterology    PR COLONOSCOPY W/BIOPSY SINGLE/MULTIPLE N/A 12/26/2015    Procedure: COLONOSCOPY, FLEXIBLE, PROXIMAL TO SPLENIC FLEXURE; WITH BIOPSY, SINGLE OR MULTIPLE;  Surgeon: Arnold Long Mir, MD;  Location: PEDS PROCEDURE ROOM [facility];  Service: Gastroenterology    PR COLONOSCOPY W/BIOPSY SINGLE/MULTIPLE N/A 09/02/2017    Procedure: COLONOSCOPY, FLEXIBLE, PROXIMAL TO SPLENIC FLEXURE; WITH BIOPSY, SINGLE OR MULTIPLE;  Surgeon: Arnold Long Mir, MD;  Location: PEDS PROCEDURE ROOM [place];  Service: Gastroenterology    PR COLSC FLX W/REMOVAL LESION BY HOT BX FORCEPS N/A 08/27/2016    Procedure: COLONOSCOPY, FLEXIBLE, PROXIMAL TO SPLENIC FLEXURE; W/REMOVAL TUMOR/POLYP/OTHER LESION, HOT BX FORCEP/CAUTE;  Surgeon: Arnold Long Mir, MD;  Location: PEDS PROCEDURE ROOM [facility];  Service: Gastroenterology    PR COLSC FLX W/RMVL OF TUMOR POLYP LESION SNARE TQ N/A 02/25/2019    Procedure: COLONOSCOPY FLEX; W/REMOV TUMOR/LES BY SNARE;  Surgeon: Helyn Numbers, MD;  Location: [facility] [facility];  Service: Gastroenterology    PR COLSC FLX W/RMVL OF TUMOR POLYP LESION SNARE TQ N/A 03/13/2020    Procedure: COLONOSCOPY FLEX; W/REMOV TUMOR/LES BY SNARE;  Surgeon: Helyn Numbers, MD;  Location: [facility];  Service: Gastroenterology    PR EXC SKIN BENIG 2.1-3 CM TRUNK,ARM,LEG Right 02/25/2017    Procedure: EXCISION, BENIGN LESION INCLUDE MARGINS, EXCEPT SKIN TAG, LEGS; EXCISED DIAMETER 2.1 TO 3.0 CM;  Surgeon: Clarene Duke, MD;  Location: CHILDRENS OR [facility];  Service: Plastics    PR EXC SKIN BENIG 3.1-4 CM TRUNK,ARM,LEG Right 02/25/2017    Procedure: EXCISION, BENIGN LESION INCLUDE MARGINS, EXCEPT SKIN TAG, ARMS; EXCISED DIAMETER 3.1 TO 4.0 CM;  Surgeon: Clarene Duke, MD;  Location: CHILDRENS OR [facility];  Service: Plastics    PR EXC SKIN BENIG >4 CM FACE,FACIAL Right 02/25/2017    Procedure: EXCISION, OTHER BENIGN LES INCLUD MARGIN, FACE/EARS/EYELIDS/NOSE/LIPS/MUCOUS MEMBRANE; EXCISED DIAM >4.0 CM;  Surgeon: Clarene Duke, MD;  Location: CHILDRENS OR [facility];  Service: Plastics    PR EXC TUMOR SOFT TISSUE LEG/ANKLE SUBQ 3+CM Right 08/05/2019    Procedure: EXCISION, TUMOR, SOFT TISSUE OF LEG OR ANKLE AREA, SUBCUTANEOUS; 3 CM OR GREATER;  Surgeon: Arsenio Katz, MD;  Location: MAIN OR [place];  Service: Plastics    PR EXC TUMOR SOFT TISSUE LEG/ANKLE SUBQ <3CM Right 08/05/2019    Procedure: EXCISION, TUMOR, SOFT TISSUE OF LEG OR ANKLE AREA, SUBCUTANEOUS; LESS THAN 3 CM;  Surgeon: Arsenio Katz, MD;  Location: MAIN OR [facility]; Service: Plastics    PR LAP, SURG PROCTECTOMY W J-POUCH N/A 08/10/2020    Procedure: ROBOTIC ASSISTED LAPAROSCOPIC PROCTOCOLECTOMY, ILEAL J POUCH, WITH OSTOMY;  Surgeon: Mickle Asper, MD;  Location: OR [place];  Service: General Surgery    PR NDSC EVAL INTSTINAL POUCH DX W/COLLJ SPEC SPX N/A 01/23/2021    Procedure: ENDO EVAL SM INTEST POUCH; DX;  Surgeon: Modena Nunnery, MD;  Location: [facility] [facility];  Service: Gastroenterology    PR NDSC EVAL INTSTINAL POUCH DX W/COLLJ SPEC SPX N/A 08/27/2021    Procedure: ENDO EVAL SM INTEST POUCH; DX;  Surgeon: Hunt Oris, MD;  Location: [facility]URES [facility];  Service: Gastroenterology  PR NDSC EVAL INTSTINAL POUCH DX W/COLLJ SPEC SPX N/A 12/09/2021    Procedure: ENDO EVAL SM INTEST POUCH; DX;  Surgeon: Vidal Schwalbe, MD;  Location: GI PROCEDURES [facility];  Service: Gastroenterology    PR NDSC EVAL INTSTINAL POUCH DX W/COLLJ [facility] SPX Left 04/09/2022    Procedure: ENDO EVAL SM INTEST POUCH; DX;  Surgeon: Modena Nunnery, MD;  Location: [facility] [facility];  Service: Gastroenterology    PR NDSC EVAL INTSTINAL POUCH DX W/COLLJ SPEC SPX N/A 08/05/2022    Procedure: ENDO EVAL SM INTEST POUCH; DX;  Surgeon: Modena Nunnery, MD;  Location: GI PROCEDURES [facility];  Service: Gastroenterology    PR NDSC EVAL INTSTINAL POUCH W/BX SINGLE/MULTIPLE N/A 01/20/2022    Procedure: ENDOSCOPIC EVAL OF SMALL INTESTINAL POUCH; DIAGNOSTIC, No biopsies;  Surgeon: Andrey Farmer, MD;  Location: GI PROCEDURES [facility];  Service: Gastroenterology    PR NDSC EVAL INTSTINAL POUCH W/BX SINGLE/MULTIPLE N/A 02/13/2022    Procedure: ENDOSCOPIC EVAL OF SMALL INTESTINAL POUCH; DIAGNOSTIC, WITH BIOPSY;  Surgeon: Bronson Curb, MD;  Location: GI PROCEDURES [facility];  Service: Gastroenterology    PR UNLISTED PROCEDURE SMALL INTESTINE  01/23/2021    Procedure: UNLISTED PROCEDURE, SMALL INTESTINE;  Surgeon: Modena Nunnery, MD;  Location: [facility] [facility];  Service: Gastroenterology    PR UNLISTED PROCEDURE SMALL INTESTINE  02/13/2022    Procedure: UNLISTED PROCEDURE, SMALL INTESTINE;  Surgeon: Bronson Curb, MD;  Location: GI PROCEDURES [name];  Service: Gastroenterology    PR UPPER GI ENDOSCOPY,BIOPSY N/A 10/27/2012    Procedure: UGI ENDOSCOPY; WITH BIOPSY, SINGLE OR MULTIPLE;  Surgeon: Shirlyn Goltz Mir, MD;  Location: PEDS PROCEDURE ROOM [facility];  Service: Gastroenterology    PR UPPER GI ENDOSCOPY,BIOPSY N/A 09/14/2013    Procedure: UGI ENDOSCOPY; WITH BIOPSY, SINGLE OR MULTIPLE;  Surgeon: Shirlyn Goltz Mir, MD;  Location: PEDS PROCEDURE ROOM [facility];  Service: Gastroenterology    PR UPPER GI ENDOSCOPY,BIOPSY N/A 11/08/2014    Procedure: UGI ENDOSCOPY; WITH BIOPSY, SINGLE OR MULTIPLE;  Surgeon: Arnold Long Mir, MD;  Location: PEDS PROCEDURE ROOM [facility];  Service: Gastroenterology    PR UPPER GI ENDOSCOPY,BIOPSY N/A 12/26/2015    Procedure: UGI ENDOSCOPY; WITH BIOPSY, SINGLE OR MULTIPLE;  Surgeon: Arnold Long Mir, MD;  Location: PEDS PROCEDURE ROOM [name];  Service: Gastroenterology    PR UPPER GI ENDOSCOPY,BIOPSY N/A 08/27/2016    Procedure: UGI ENDOSCOPY; WITH BIOPSY, SINGLE OR MULTIPLE;  Surgeon: Arnold Long Mir, MD;  Location: PEDS PROCEDURE ROOM [facility];  Service: Gastroenterology    PR UPPER GI ENDOSCOPY,BIOPSY N/A 09/02/2017    Procedure: UGI ENDOSCOPY; WITH BIOPSY, SINGLE OR MULTIPLE;  Surgeon: Arnold Long Mir, MD;  Location: PEDS PROCEDURE ROOM [facility];  Service: Gastroenterology    PR UPPER GI ENDOSCOPY,BIOPSY N/A 03/13/2020    Procedure: UGI ENDOSCOPY; WITH BIOPSY, SINGLE OR MULTIPLE;  Surgeon: Helyn Numbers, MD;  Location: [facility] [facility];  Service: Gastroenterology    PR UPPER GI ENDOSCOPY,BIOPSY N/A 09/05/2021    Procedure: UGI ENDOSCOPY; WITH BIOPSY, SINGLE OR MULTIPLE;  Surgeon: Wendall Papa, MD;  Location: GI PROCEDURES [facility];  Service: Gastroenterology    PR UPPER GI ENDOSCOPY,DIAGNOSIS N/A 01/20/2022    Procedure: UGI ENDO, INCLUDE ESOPHAGUS, STOMACH, & DUODENUM &/OR JEJUNUM; DX W/WO COLLECTION SPECIMN, BY BRUSH OR WASH;  Surgeon: Andrey Farmer, MD;  Location: GI PROCEDURES [facility];  Service: Gastroenterology    TUMOR REMOVAL      multiple-head, neck, back, hand, right flank,  multiple      Family History   Problem Relation Age of Onset    No Known Problems Mother     No Known Problems Father     No Known Problems Sister     No Known Problems Brother     Stroke Maternal Grandmother     Other Maternal Grandmother         benign lesions of liver and pancreas, further details unknown    Cancer Maternal Grandmother     Diabetes Maternal Grandmother     Hypertension Maternal Grandmother     Thyroid disease Maternal Grandmother     Arthritis Maternal Grandfather     Asthma Maternal Grandfather     COPD Paternal Grandmother         Deceased    Miscarriages / Stillbirths Paternal Grandmother     Alcohol abuse Paternal Grandfather         Deceased    No Known Problems Maternal Aunt     No Known Problems Maternal Uncle     No Known Problems Paternal Aunt     No Known Problems Paternal Uncle     Anesthesia problems Neg Hx     Broken bones Neg Hx     Cancer Neg Hx     Clotting disorder Neg Hx     Collagen disease Neg Hx     Diabetes Neg Hx     Dislocations Neg Hx     Fibromyalgia Neg Hx     Gout Neg Hx     Hemophilia Neg Hx     Osteoporosis Neg Hx     Rheumatologic disease Neg Hx     Scoliosis Neg Hx     Severe sprains Neg Hx     Sickle cell anemia Neg Hx     Spinal Compression Fracture Neg Hx     Melanoma Neg Hx     Basal cell carcinoma Neg Hx     Squamous cell carcinoma Neg Hx         Adhesive tape-silicones; Ferrlecit [sodium ferric gluconat-sucrose]; Levofloxacin; Methylnaltrexone; Neomycin; Papaya; Morphine; Zosyn [piperacillin-tazobactam]; Compazine [prochlorperazine]; Iron analogues; Iron dextran; and Latex, natural rubber    Objective Findings     Medical Tests / Procedures: Reviewed in EPIC      Activity Tolerance: Tolerated treatment well     Precautions: Falls precautions       Weight Bearing Status: Non-applicable     Required Braces or Orthoses: Non-applicable     Equipment / Environment: Vascular access (PIV, TLC, Port-a-cath, PICC), Patient not wearing mask for full session, Caregiver not wearing mask for full session, Supplemental oxygen       Communication Preference: Verbal     Pain Comments: Reporting low back pain and R hip pain during this session; tolerable throughout                    Cognition  Orientation Level: Oriented x 4  Arousal/Alertness: Appropriate responses to stimuli  Attention Span: Appears intact  Memory: Appears intact  Following Commands: Follows all commands and directions without difficulty  Safety Judgment: Good awareness of safety precautions  Awareness of Errors: Good awareness of errors made  Problem Solving: Able to problem solve independently  Comments: Patient pleasant, cooperative, and very grateful to be on rehab to continue to working towards her recovery. Patient verbalizing some anxiety since moving to a new hospital and verbose at times, however, redirectable.     Vision  Baseline  Vision: Wears contacts  Acute Visual Changes: No acute deficits identified  Vision Comments: Patient with hx of cataract in R eye; wearing one contact on R side - no acute changes in vision since hospitalization    Hearing  Hearing: No deficit identified       Hand Function  Right Hand Function: Right hand function impaired  Right Hand Impairment: grip strength poor, coordination impaired  Left Hand Function: Left hand function impaired  Left Hand Impairment: grip strength fair, coordination impaired  Hand Function comments: RUE grip strength 2/5 MMT (tumor in R hand/wrist impacting grip strength per patient report); LUE grip strength 3/5 MMT  Hand Dominance: Right     Skin Inspection  Skin Inspection: Intact where visualized     UE ROM - Strength  UE ROM/Strength: Left Impaired/Limited, Right Impaired/Limited  RUE Impairment: Reduced strength, Limited AROM  LUE Impairment: Reduced strength, Limited AROM  UE ROM/ Strength Comment: Tumor behind R scapula impacting shoulder AROM above head; BUE shoulder MMT 2-2+/5 MMT (unable to complete full ROM); BUE elbow flexion 3+/5 MMT    LE ROM / Strength  LE ROM/Strength: Left Impaired/Limited, Right Impaired/Limited  RLE Impairment: Reduced strength, Limited AROM, Limited PROM, Pain with movement  LLE Impairment: Reduced strength, Limited AROM, Limited PROM  LE ROM/ Strength Comment: BLE rigidity and stiffness in full extension - see PT evaluation for formal testing     Coordination  Coordination: Impaired     Sensation  RUE Sensation: RUE impaired  RUE Sensation Impairment: Numbness, Tingling (Digits 4&5 along ulnar aspect of forearm - baseline numbness/tingling from tumors per patient report)  LUE Sensation: LUE intact  RLE Sensation: RLE intact  LLE Sensation: LLE intact  Sensory/ Proprioception/ Stereognosis comments: -     Balance: Patient able to sit EOB with SBA/CGA using bed rail on L side for support; patient often leaning to the L due to weakness and decreased functional activity tolerance.     Functional Mobility  Transfer Assistance Needed: Yes  Transfers: Mod assist, Physical assistance required (Patient able to complete sit <> stand from EOB using RW with modA (OTA student present for added safety, however, not needed for sit <> stand). 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     Eating   Discharge Goal: Independent  Assistance Needed: Set-up / clean-up  CARE Score - Eating: 5    Oral Hygiene  Discharge Goal: Independent  Assistance Needed: Set-up / clean-up  Comment: Seated  CARE Score - Oral Hygiene: 5         Toileting Hygiene  Discharge Goal: Independent  Assistance Needed: Physical assistance  Physical Assistance Level: Total assistance  CARE Score - Toileting Hygiene: 1    Toileting Transfer  Discharge Goal: Independent  Reason if not Attempted: Safety concerns  CARE Score - Toilet Transfer: 5           Shower/Bathe  Discharge Goal: Set-up/clean-up  Reason if not Attempted: Safety concerns  CARE Score - Shower/Bathe Self: 88           UE Dressing  Discharge Goal: Independent  Assistance Needed: Physical  assistance  Physical Assistance Level: 26%-50%  CARE Score - Upper Body Dressing: 3     LE Dressing  Discharge Goal: Independent  Goal details:: Using AE PRN  Assistance Needed: Physical assistance  Physical Assistance Level: Total assistance  CARE Score - Lower Body Dressing: 1    Footwear  Discharge Goal: Independent  Goal details:: Using AE PRN  Assistance Needed: Physical assistance  Physical Assistance Level: Total assistance  CARE Score - Putting On/Taking Off Footwear: 1          Short and Long Term Goals  SHORT GOAL #1: Patient will demonstrate good understanding of BUE strengthening/AROM HEP.       SHORT GOAL #2: Patient will complete functional task seated unsupported for +15 min with supervision and no rest breaks.            Long Term Goal #1: Patient will complete basic ADLs and functional transfers at a set up to modI level in prep for DC.       Plan     Follow-up / Frequency  Minutes per day: 1-2 hours/day  Days per Week: 5-7     Planned Interventions: Adaptive equipment, ADL retraining, Balance activities, Bed mobility, Compensatory tech. training, Conservation, Education - Patient, Home exercise program, Functional mobility, Environmental support, Endurance activities, Education - Family / caregiver, Neuromuscular re-education, Passive range of motion, Range of motion, Postural intervention - supported sitting, Positioning, Postular / Proximal stability, Safety education, Therapeutic exercise, Wheelchair training, Wheelchair evaluation / modification, UE Strength / coordination exercise, Transfer training    OT Individual [mins]: 60      I attest that I have reviewed the above information.  Signed: Maureen Ralphs, OT  Filed 11/20/2022     The care for this patient was completed by Maureen Ralphs, OT:  A student was present and Observed patient care.    Maureen Ralphs, OT
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
## 2022-11-20 NOTE — Unmapped (Signed)
Alert and oriented x 4. No c/o falls. No c/o uncontrolled pain or nausea. TLC dressing changed. Entry point of catheter looks to be irritated from unsecured movements and / or maceration. Dressing changed. Pic taken per [employer]. Cont of b/b. LBM 11-20-22. Stool brown, soft, non-formed. OOB to [facility] with mod/max assist to stand pivot. Post toileting care per staff. Has swollen area at base of mid sacrum. Marked with marker to monitor changes. TF overnight without difficulty. 2 of 3 lines of TLC are partially occluded. Skin intact. Developing foot drop. Sleeps on pressure relief pillow. Declines to turn. No s/s pressure injury. Rested well during late hours of the night.        Problem: Rehabilitation (IRF) Plan of Care  Goal: Plan of Care Review  Outcome: Progressing  Goal: Patient-Specific Goal (Individualized)  Outcome: Progressing  Goal: Absence of New-Onset Illness or Injury  Outcome: Progressing  Intervention: Prevent Fall and Fall Injury  Recent Flowsheet Documentation  Taken 11/20/2022 0200 by Boone Master, RN  Safety Interventions: fall reduction program maintained  Taken 11/20/2022 0000 by Boone Master, RN  Safety Interventions: fall reduction program maintained  Taken 11/19/2022 2200 by Boone Master, RN  Safety Interventions: fall reduction program maintained  Taken 11/19/2022 1930 by Boone Master, RN  Safety Interventions: fall reduction program maintained  Intervention: Prevent Skin Injury  Recent Flowsheet Documentation  Taken 11/20/2022 0200 by Boone Master, RN  Device Skin Pressure Protection: absorbent pad utilized/changed  Taken 11/20/2022 0000 by Boone Master, RN  Device Skin Pressure Protection: absorbent pad utilized/changed  Taken 11/19/2022 2300 by Boone Master, RN  Device Skin Pressure Protection: absorbent pad utilized/changed  Taken 11/19/2022 2200 by Boone Master, RN  Device Skin Pressure Protection: absorbent pad utilized/changed  Skin Protection: (sitting on donut pillow from home and lumbar pillow from home) --  Taken 11/19/2022 1930 by Boone Master, RN  Device Skin Pressure Protection: absorbent pad utilized/changed  Intervention: Prevent Infection  Recent Flowsheet Documentation  Taken 11/20/2022 0200 by Boone Master, RN  Infection Prevention: hand hygiene promoted  Taken 11/20/2022 0000 by Boone Master, RN  Infection Prevention: hand hygiene promoted  Taken 11/19/2022 2200 by Boone Master, RN  Infection Prevention: hand hygiene promoted  Taken 11/19/2022 1930 by Boone Master, RN  Infection Prevention: hand hygiene promoted  Goal: Optimal Comfort and Wellbeing  Outcome: Progressing  Goal: Home and Community Transition Plan Established  Outcome: Progressing  Goal: Rounds/Family Conference  Outcome: Progressing     Problem: Rehabilitation (IRF) Plan of Care  Goal: Absence of New-Onset Illness or Injury  Outcome: Progressing  Intervention: Prevent Fall and Fall Injury  Recent Flowsheet Documentation  Taken 11/20/2022 0200 by Boone Master, RN  Safety Interventions: fall reduction program maintained  Taken 11/20/2022 0000 by Boone Master, RN  Safety Interventions: fall reduction program maintained  Taken 11/19/2022 2200 by Boone Master, RN  Safety Interventions: fall reduction program maintained  Taken 11/19/2022 1930 by Boone Master, RN  Safety Interventions: fall reduction program maintained  Intervention: Prevent Skin Injury  Recent Flowsheet Documentation  Taken 11/20/2022 0200 by Boone Master, RN  Device Skin Pressure Protection: absorbent pad utilized/changed  Taken 11/20/2022 0000 by Boone Master, RN  Device Skin Pressure Protection: absorbent pad utilized/changed  Taken 11/19/2022 2300 by Boone Master, RN  Device Skin Pressure Protection: absorbent pad utilized/changed  Taken 11/19/2022 2200 by Boone Master, RN  Device  Skin Pressure Protection: absorbent pad utilized/changed  Skin Protection: (sitting on donut pillow from home and lumbar pillow from home) --  Taken 11/19/2022 1930 by Boone Master, RN  Device Skin Pressure Protection: absorbent pad utilized/changed  Intervention: Prevent Infection  Recent Flowsheet Documentation  Taken 11/20/2022 0200 by Boone Master, RN  Infection Prevention: hand hygiene promoted  Taken 11/20/2022 0000 by Boone Master, RN  Infection Prevention: hand hygiene promoted  Taken 11/19/2022 2200 by Boone Master, RN  Infection Prevention: hand hygiene promoted  Taken 11/19/2022 1930 by Boone Master, RN  Infection Prevention: hand hygiene promoted     Problem: Self-Care Deficit  Goal: Improved Ability to Complete Activities of Daily Living  Outcome: Progressing

## 2022-11-21 MED ADMIN — diclofenac sodium (VOLTAREN) 1 % gel 2 g: 2 g | TOPICAL | @ 04:00:00

## 2022-11-21 MED ADMIN — HYDROmorphone (PF) (DILAUDID) injection 0.5 mg: .5 mg | INTRAVENOUS | @ 14:00:00 | Stop: 2022-11-24

## 2022-11-21 MED ADMIN — buprenorphine-naloxone (SUBOXONE) 2-0.5 mg SL film 1 mg of buprenorphine: .5 | SUBLINGUAL | @ 14:00:00

## 2022-11-21 MED ADMIN — buprenorphine-naloxone (SUBOXONE) 2-0.5 mg SL film 1 mg of buprenorphine: .5 | SUBLINGUAL | @ 19:00:00

## 2022-11-21 MED ADMIN — oxyCODONE (ROXICODONE) immediate release tablet 15 mg: 15 mg | ORAL | @ 22:00:00 | Stop: 2022-11-28

## 2022-11-21 MED ADMIN — gabapentin (NEURONTIN) capsule 100 mg: 100 mg | ORAL | @ 19:00:00

## 2022-11-21 MED ADMIN — ondansetron (ZOFRAN) injection 4 mg: 4 mg | INTRAVENOUS | @ 16:00:00

## 2022-11-21 MED ADMIN — gabapentin (NEURONTIN) capsule 100 mg: 100 mg | ORAL | @ 14:00:00

## 2022-11-21 MED ADMIN — carboxymethylcellulose sodium (REFRESH CELLUVISC) 1 % ophthalmic gel 1 drop: 1 [drp] | OPHTHALMIC | @ 03:00:00

## 2022-11-21 MED ADMIN — diazePAM (VALIUM) tablet 2.5 mg: 2.5 mg | ORAL | @ 19:00:00

## 2022-11-21 MED ADMIN — HYDROmorphone (PF) (DILAUDID) injection 0.5 mg: .5 mg | INTRAVENOUS | @ 20:00:00 | Stop: 2022-11-24

## 2022-11-21 MED ADMIN — promethazine (PHENERGAN) 6.5 mg in sodium chloride (NS) 0.9 % 50 mL IVPB: 6.5 mg | INTRAVENOUS | @ 04:00:00

## 2022-11-21 MED ADMIN — diclofenac sodium (VOLTAREN) 1 % gel 2 g: 2 g | TOPICAL | @ 17:00:00

## 2022-11-21 MED ADMIN — ondansetron (ZOFRAN) injection 4 mg: 4 mg | INTRAVENOUS

## 2022-11-21 MED ADMIN — famotidine (PEPCID) tablet 20 mg: 20 mg | ORAL | @ 03:00:00

## 2022-11-21 MED ADMIN — nirogacestat (OGSIVEO) tablet 100 mg **Patient Supplied**: 100 mg | ORAL | @ 22:00:00 | Stop: 2022-11-26

## 2022-11-21 MED ADMIN — pantoprazole (Protonix) EC tablet 40 mg: 40 mg | ORAL | @ 03:00:00

## 2022-11-21 MED ADMIN — oxyCODONE (ROXICODONE) immediate release tablet 15 mg: 15 mg | ORAL | @ 10:00:00 | Stop: 2022-11-28

## 2022-11-21 MED ADMIN — oxyCODONE (ROXICODONE) immediate release tablet 15 mg: 15 mg | ORAL | @ 03:00:00 | Stop: 2022-11-28

## 2022-11-21 MED ADMIN — carboxymethylcellulose sodium (REFRESH CELLUVISC) 1 % ophthalmic gel 1 drop: 1 [drp] | OPHTHALMIC | @ 22:00:00

## 2022-11-21 MED ADMIN — promethazine (PHENERGAN) 6.5 mg in sodium chloride (NS) 0.9 % 50 mL IVPB: 6.5 mg | INTRAVENOUS | @ 14:00:00

## 2022-11-21 MED ADMIN — nirogacestat (OGSIVEO) tablet 100 mg **Patient Supplied**: 100 mg | ORAL | @ 10:00:00 | Stop: 2022-11-26

## 2022-11-21 MED ADMIN — promethazine (PHENERGAN) 6.5 mg in sodium chloride (NS) 0.9 % 50 mL IVPB: 6.5 mg | INTRAVENOUS | @ 20:00:00

## 2022-11-21 MED ADMIN — diclofenac sodium (VOLTAREN) 1 % gel 2 g: 2 g | TOPICAL | @ 14:00:00

## 2022-11-21 MED ADMIN — multivitamin with folic acid 400 mcg tablet 1 tablet: 1 | ORAL | @ 14:00:00

## 2022-11-21 MED ADMIN — diclofenac sodium (VOLTAREN) 1 % gel 2 g: 2 g | TOPICAL | @ 22:00:00

## 2022-11-21 MED ADMIN — gabapentin (NEURONTIN) capsule 100 mg: 100 mg | ORAL | @ 01:00:00

## 2022-11-21 MED ADMIN — naloxegol (MOVANTIK) 12.5 mg tablet 12.5 mg: 12.5 mg | ORAL | @ 14:00:00

## 2022-11-21 MED ADMIN — lidocaine (ASPERCREME) 4 % 2 patch: 2 | TRANSDERMAL | @ 14:00:00

## 2022-11-21 MED ADMIN — nirogacestat (OGSIVEO) tablet 100 mg **Patient Supplied**: 100 mg | ORAL | Stop: 2022-11-26

## 2022-11-21 MED ADMIN — diazePAM (VALIUM) tablet 5 mg: 5 mg | ORAL | @ 01:00:00

## 2022-11-21 MED ADMIN — HYDROmorphone (PF) (DILAUDID) injection 0.5 mg: .5 mg | INTRAVENOUS | @ 06:00:00 | Stop: 2022-11-24

## 2022-11-21 MED ADMIN — carboxymethylcellulose sodium (REFRESH CELLUVISC) 1 % ophthalmic gel 1 drop: 1 [drp] | OPHTHALMIC | @ 16:00:00

## 2022-11-21 MED ADMIN — mirtazapine (REMERON) tablet 15 mg: 15 mg | GASTROENTERAL | @ 03:00:00

## 2022-11-21 MED ADMIN — diazePAM (VALIUM) tablet 2.5 mg: 2.5 mg | ORAL | @ 14:00:00

## 2022-11-21 MED ADMIN — carboxymethylcellulose sodium (REFRESH CELLUVISC) 1 % ophthalmic gel 1 drop: 1 [drp] | OPHTHALMIC | @ 14:00:00

## 2022-11-21 MED ADMIN — buprenorphine-naloxone (SUBOXONE) 2-0.5 mg SL film 1 mg of buprenorphine: .5 | SUBLINGUAL

## 2022-11-21 MED ADMIN — pantoprazole (Protonix) EC tablet 40 mg: 40 mg | ORAL | @ 14:00:00

## 2022-11-21 MED ADMIN — oxyCODONE (ROXICODONE) immediate release tablet 15 mg: 15 mg | ORAL | @ 16:00:00 | Stop: 2022-11-28

## 2022-11-21 MED ADMIN — HYDROmorphone (PF) (DILAUDID) injection 0.5 mg: .5 mg | INTRAVENOUS | Stop: 2022-11-24

## 2022-11-21 NOTE — Unmapped (Signed)
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 Admitted to [facility] on 09/25/2022 for ileus, inability to tolerate PO now on supplemental tube feed, and diffuse pain due to muscle spasms of uncertain etiology.      Rehab Impairment Group Code [facility]): (Debility) 16 Debility (Non-Cardiac/Non-Pulmonary)  Etiology: Diffuse pain due to muscle spasms of uncertain etiology and cutaneous desmoid tumors    PLAN:     This patient is admitted to the Physical Medicine and Rehabilitation - Inpatient - C service from 8am-5pm on weekdays for questions regarding this patient. After hours, weekends, and holidays please contact the 1st Call resident pager     REHAB:   - PT and OT to maximize functional status with mobility and ADLs as well as prevention of joint contracture.   - Neuropsych for higher level cognitive evaluation and coping.  - RT for community re-integration, education, and leisure support services.  - Nutrition consult for diet information/teaching.   - To be discussed in weekly Interdisciplinary Team Conference.     Increased Tonicity  Dystonia   Started post-procedurally per patient ~09/25/22. Occurs intermittently throughout the day with a heightened occurrence during mobilization. At rest patient bilateral ankles in plantarflexion with worsening heel cord tightening. Has tonicity most prominent to HF and KE that is not velocity dependent. Has not noted significant improvement with increase of baclofen to 15 mg TID in setting of infection as below. Neurology previously evaluated patient seemingly ruling out an acute dystonic reaction, cord compression, central process, nutritional deficiency, stiff person syndrome and [name] [name] after normal EMG on 8/22. Continues to be well controlled with valium, small improvement with baclofen PRN.  - Continue Baclofen 10 mg TID prn  - Continued Valium 2.5/2.5/5 mg TID and 2.5 mg nightly PRN  - PRAFO boots in bed     Headache  Currently managed with tylenol. She does have some posterior neck muscle tightness. Notes ketamine is helpful for headache pains. Describes photophobia and phonophobia with events. She does have loss of cervical lordosis on most recent MRI.   - Consider starting propranolol 5 mg BID for headache prophylaxis.   - Prn tylenol +/- prn fiorcet   - Lidocaine cream PRN to area of pain for cervicogenic headaches     Pain  Pain management consulted with recommendations as below. Patient will need to follow up with outpatient chronic pain provider (Dr. Manson [name]).  - Tylenol 650 mg q6h PRN  - IV Dilaudid 0.5 mg q6h PRN; will wean as tolerated.  - Oxyocodone 10-15 mg q4h PRN  - Suboxone 2-0.5 mg TID  - Continue Gabapentin 100 mg TID, can increase to 300mg  TID as tolerated  - Voltaren gel QID, lidocaine patches  - Continue patellar taping with therapy or obtain patellar brace to help with pain with ambulation  - Continue Movantik 12.5mg  daily; discontinue upon discharge from AIR     Multifactorial Ileus   Nausea (chronic)  [employer] Emesis 2/2 NGT placement (resolved)  Contributors with history of narcotics, poor PO intake. Concern for malnutrition. Improvements to abdominal pain since weaning opioids. TPN has been discontinued with patient now tolerating tube feeds. Patient with chronic nausea that was previously managed with Zofran, though transitioned to phenergan due to prolonged Qtc. Upon admission to AIR,  continued nightly tube feeds with Corpak NG tube in place, and PO intake as tolerated during the day. Will continue to decrease tube feeds with goal of discharge from AIR on full regular diet. If unable to tolerate full PO diet, will consider discharge home with corpak. Patient noted weight gain upon arrival to AIR (felt likely fluid retention) as weight was increased in a short period of time.   - Dietician following; appreciate care and recommendations  - Continue nightly tube feeds  - Continue PO regular diet + protein supplementation; anticipate NG tube removal prior to discharge   - START calorie count 9/13  - START daily weights 9/13  - Continue Protonix 40mg  BID  - Continue IV phenergan 6.5mg  Q6 PRN; will continue to monitor and consider scopolamine patch in hopes of transitioning off IV  [ ]  restart home cefdinir and doxycycline upon discharge from AIR for SIBP     Syncope  Multifactorial. Primary team ddx includes: vasovagal vs adverse effect of medications vs deconditioning vs iron deficiency. Last echo 04/2022. Stable upon admission to AIR.   - CTM     IDA  - Has allergy to formula iron      [name] - Desmoid Tumors  Follows [facility] (Dr. Meredith [name]) is patient's outpatient oncologist.   - Continue Nirogacestat 100mg  BID  - M/Th CMP; monitor LFT  [ ]  increase nirogacestat to 150mg  BID on 9/17 per oncology     Enterobacteriaceae group bacteremia (Resolved) - C albicans fungemia (Resolved)  Likely due to RIJ tunneled line placed 01/2022 now removed on 8/14.  8/13, 8/17 repeat blood cultures NGTD.  No evidence of vitreitis, retinitis 8/15 per Ophtho.  TTE 8/15 w possible vegetations on MV and TV or indwelling catheter; however, TEE 8/20 w/o vegetations. ID consulted during acute admission and now s/p treatment with cephalosporin and fluconazole on 8/31. Patient noted to have difficult IV access and kept R IJ access upon admission to AIR.   [ ]  Remove R internal jugular access when able to wean off IV meds.     Generalized anxiety disorder, depression, h/o panic attacks  Psychiatry consulted during acute hospitalization with recommendations as below.   - Consider decreasing mirtazapine 15 mg nightly to 7.5 mg nightly due to somnolence  - Valium as above, if needed could give 2.5mg  PRN for acute anxiety. Daily Checklist  - Diet: Regular diet + nighttime tube feeds  - DVT PPX: SQ enoxaparin   - GI PPX: Protonix  - Access: R IJ non tunneled CVC. Necessary for IV medications; will remove when able.    DISPO: Patient to be discussed at weekly interdisciplinary team conference.   - EDD: 12/10/2022  - Follow-up: PCP, Chronic Pain, Oncology (Dr. Meredith [name]), Psych    SUBJECTIVE and PROGRESS WITH FUNCTIONAL ACTIVITIES:     Interval Events:   NAEON. Patient and mother noted bilateral feet swelling and some abdominal swelling. Noted that she previously had swelling when she was taking Lyrica (stopped >month ago and switched to Gabapentin). Patient discussed with dietician and feels most likely explanation is fluid retention. Will plan for daily weights and calorie count. Can consider diuretic if worsening.     OBJECTIVE:     Vital signs (last 24 hours):  Temp:  [36.5 ??C (97.7 ??F)] 36.5 ??C (97.7 ??F)  Heart Rate:  [90-95] 90  Resp:  [18-19] 18  BP: (96-113)/(52-68) 96/52  MAP (mmHg):  [66-80] 66  SpO2:  [100 %] 100 %    Intake/Output (  last 3 shifts):  I/O last 3 completed shifts:  In: 100 [I.V.:30; NG/GT:70]  Out: 1725 [Urine:1725]    Physical Exam:   GEN: Sitting comfortably in bed in no acute distress  EYES: sclera anicteric, conjunctiva clear   HEENT: corpak in place L nare. R I.J. in place (nontunneled)  CV: warm extremities, well perfused, no cyanosis, no pitting edema appreciated in bilateral lower extremities  RESP:  NWOB on room air ([place] in place)  ABD: Not distended  SKIN: No visible masses, lesions, rashes, ecchymoses.   MSK: Not visualized today, previously sacrum with some soft tissue swelling near lumbosacral junction. Mild TTP in surrounding tissues. No erythema, bruising or skin breakdown  NEURO: Alert, speech fluent  PSYCH: Mood euthymic, affect appropriate, tearful    Medications:  Scheduled    buprenorphine-naloxone (SUBOXONE) 2-0.5 mg SL film 1 mg of buprenorphine TID    carboxymethylcellulose sodium (REFRESH CELLUVISC) 1 % ophthalmic gel 1 drop QID    diazePAM (VALIUM) tablet 2.5 mg Daily    And    diazePAM (VALIUM) tablet 2.5 mg Daily    diazePAM (VALIUM) tablet 5 mg Nightly    diclofenac sodium (VOLTAREN) 1 % gel 2 g QID    enoxaparin (LOVENOX) syringe 40 mg Q24H SCH    famotidine (PEPCID) tablet 20 mg Nightly    gabapentin (NEURONTIN) capsule 100 mg TID    lidocaine (ASPERCREME) 4 % 2 patch Daily    mirtazapine (REMERON) tablet 15 mg Nightly    multivitamin with folic acid 400 mcg tablet 1 tablet Daily    naloxegol (MOVANTIK) 12.5 mg tablet 12.5 mg Daily    nirogacestat (OGSIVEO) tablet 100 mg **Patient Supplied** BID    pantoprazole (Protonix) EC tablet 40 mg BID     PRN acetaminophen, 650 mg, Q6H PRN  alum-mag-simeth, 60 mL, Q6H PRN  baclofen, 10 mg, TID PRN  bisacodyl, 10 mg, BID PRN  butalbital-acetaminophen-caffeine, 1 tablet, BID PRN  carboxymethylcellulose sodium, 2 drop, QID PRN  Chemo Clarification Order, , Continuous PRN  Chemo Clarification Order, , Continuous PRN  diazePAM, 2.5 mg, Nightly PRN  guaiFENesin, 200 mg, Q4H PRN  hydrocortisone, , BID PRN  HYDROmorphone, 0.5 mg, Q6H PRN  IP okay to treat, , Continuous PRN  lidocaine-diphenhydrAMINE-aluminum-magnesium, 10 mL, QID PRN  ondansetron, 4 mg, Q12H PRN  oxyCODONE, 10 mg, Q4H PRN   Or  oxyCODONE, 15 mg, Q4H PRN  phenol, 2 spray, Q2H PRN  polyethylene glycol, 17 g, BID PRN  promethazine, 6.5 mg, Q6H PRN  pseudoephedrine, 30 mg, Daily PRN  senna, 1 tablet, BID PRN  zinc oxide-cod liver oil, , Daily PRN        Labs/Studies: Reviewed.    Radiology Results: Reviewed    Quality Indicators      Hearing, Speech, and Vision  Ability to Hear: Adequate  Ability to See in Adequate Light: Adequate  Expression of Ideas and Wants: Without difficulty  Understanding Verbal and Non-Verbal Content: Understands    Cognitive Pattern Assessment  Cognitive Pattern Assessment Used: BIMS  Brief Interview for Mental Status (BIMS)  Repetition of Three Words (First Attempt): 3  Temporal Orientation: Year: Correct  Temporal Orientation: Month: Accurate within 5 days  Temporal Orientation: Day: Correct  Recall: Sock: Yes, no cue required  Recall: Blue: Yes, no cue required  Recall: Bed: Yes, no cue required  BIMS Summary Score: 15       Nutritional Approaches  Nutritional Approach: Feeding tube    ADLs  Admission Current  Eating Assistance Needed: Set-up / clean-up    CARE Score - 5 Assistance Needed: Set-up / clean-up    CARE Score - 5   Oral Hygiene  Assistance Needed: Set-up / clean-up    CARE Score - 5  Assistance Needed: Set-up / clean-up    CARE Score - 5   Bladder Continence       Bowel Continence       Toileting Hygiene Assistance Needed: Physical assistance Total assistance  CARE Score - 1 Assistance Needed: Physical assistance Total assistance  CARE Score - 1    Toilet Transfer      CARE Score - 88      CARE Score - 88   Shower/Bathe Self      CARE Score - 88       CARE Score - 88   Upper Body Dressing Assistance Needed: Physical assistance 26%-50%  CARE Score - 3  Assistance Needed: Physical assistance 26%-50%  CARE Score - 3   Lower Body Dressing Assistance Needed: Physical assistance Total assistance  CARE Score - 1  Assistance Needed: Physical assistance Total assistance  CARE Score - 1   On/Off Footwear Assistance Needed: Physical assistance Total assistance  CARE Score - 1  Assistance Needed: Physical assistance Total assistance  CARE Score - 1         Transfers Admission Current   Bed to Chair Assistance Needed: Physical assistance Total assistance  CARE Score - 1  Assistance Needed: Physical assistance Total assistance  CARE Score - 1     Lying to Sitting Assistance Needed: Physical assistance Total assistance  CARE Score - 1  Assistance Needed: Physical assistance Total assistance  CARE Score - 1   Roll Left/Right Assistance Needed: Physical assistance 76% or more  CARE Score - 2  Assistance Needed: Physical assistance 76% or more  CARE Score - 2   Sit to Lying Assistance Needed: Physical assistance Total assistance  CARE Score - 1  Assistance Needed: Physical assistance Total assistance  CARE Score - 1   Sit to Stand Assistance Needed: Physical assistance Total assistance  CARE Score - 1  Assistance Needed: Physical assistance Total assistance  CARE Score - 1         Mobility Admission Current   Walk 10 Feet Assistance Needed: Physical assistance Total assistance  CARE Score - 1 Assistance Needed: Physical assistance Total assistance  CARE Score - 1     Walk 50 Feet 2 Turns      CARE Score - 88      CARE Score - 88   Walk 150 Feet      CARE Score - 88       CARE Score - 88   Walk 10 Feet Uneven      CARE Score - 88       CARE Score - 88   1 Step (Curb)      CARE Score - 88       CARE Score - 88   4 Steps      CARE Score - 88       CARE Score - 88   12 Steps      CARE Score - 88       CARE Score - 88   Picking Up Object      CARE Score - 88       CARE Score - 88   Wheelchair/Scooter Use        Wheel 50 Feet  2 Turns Assistance Needed: Physical assistance Total assistance    CARE Score - Wheel 50 Feet with Two Turns: 1    Type of Wheelchair/Scooter: Manual Assistance Needed: Physical assistance Total assistance    CARE Score - Wheel 50 Feet with Two Turns: 1    Type of Wheelchair/Scooter: Manual   Wheel 150 Feet Assistance Needed: Physical assistance Total assistance    CARE Score - Wheel 150 Feet: 1    Type of Wheelchair/Scooter: Manual  Assistance Needed: Physical assistance Total assistance    CARE Score - Wheel 150 Feet: 1    Type of Wheelchair/Scooter: Manual

## 2022-11-21 NOTE — Unmapped (Signed)
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 [name] [name] was admitted to [facility] on 09/25/2022 for ileus, inability to tolerate PO now on supplemental tube feed, and diffuse pain due to muscle spasms of uncertain etiology. [name] [name] has participated in acute inpatient physical and occupational therapies to improve functional mobility, activity tolerance, functional strength, balance, and endurance in order to facilitate safe performance of ADLs and daily routines.        The patient will receive interdisciplinary care, including daily physician management for the following:   Pain, Monitoring for medication side effects, and Nutrition     The patient will benefit from continued services by:   Therapy Type Min/Day Days/Week   Physical therapy 1-2 hours/day 5-7   Occupational therapy 1-2 hours/day 5-7   Speech Therapy         The patient will benefit from additional services by:  Recreational Therapy, Neuropsychology, and Dietary / Nutrition    Rehab nursing is required to help manage the patient???s complex nursing needs related to their documented medical conditions.     The patient's medical prognosis is Fair to achieve the stated goals below and to be able to be discharged home with family assistance or supervision within the estimated length of stay of 21days.     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  Mobility: to be able to walk by myself  Community Reintegration: Get better and continue working toward my goals     Quality Indicators - Goals     Eating Discharge Goal  Discharge Goal: Independent     Oral Hygiene Discharge Goal  Discharge Goal: Independent    Toileting Hygiene Discharge Goal  Discharge Goal: Independent    Toilet Transfer Discharge Goal  Discharge Goal: Independent    Shower/Bathe Self Discharge Goal  Discharge Goal: Set-up/clean-up    Upper Body Dressing Discharge Goal  Discharge Goal: Independent    Lower Body Dressing Discharge Goal  Discharge Goal: Independent    Putting On/Taking Off Footwear Discharge Goal  Discharge Goal: Independent    Roll Left and Right Discharge Goal  Discharge Goal: Independent    Sit to Lying Discharge Goal  Discharge Goal: Independent    Lying to Sitting on Side of Bed Discharge Goal  Discharge Goal: Independent    Sit to Stand Discharge Goal  Discharge Goal: Independent    Chair/Bed-to-Chair Transfer Discharge Goal  Discharge Goal: Independent    Car Transfer Discharge Goal  Discharge Goal: Independent    Walk 10 Feet Discharge Goal  Discharge Goal: Supervision or touching assistance    Walk 50 Feet with Two Turns Discharge Goal  Discharge Goal: Supervision or touching assistance    Walk 150 Feet Discharge Goal  Discharge Goal: Supervision or touching assistance    Walking 10 Feet on Uneven Surfaces Discharge Goal  Discharge Goal: Supervision or touching assistance    1  Step (Curb) Discharge Goal  Discharge Goal: Supervision or touching assistance    4 Steps Discharge Goal  Discharge Goal: Supervision or touching assistance    12 Steps Discharge Goal  Discharge Goal: Supervision or touching assistance    Picking Up Object Discharge Goal  Discharge Goal: Supervision or touching assistance    Wheel 50 Feet with Two Turns Discharge Goal  Discharge Goal: Independent    Wheel 150 Feet Discharge Goal  Discharge Goal: Independent

## 2022-11-21 NOTE — Unmapped (Signed)
Patient is A&O. Regular diet, independent with meals. Continent of bowel and bladder. No new signs of skin breakdown. Pain and nausea controlled with prn/scheduled meds. Skin and safety protocol maintained. Bed in lowest position, call bell within reach, wheels locked. Will continue to monitor.     Problem: Rehabilitation (IRF) Plan of Care  Goal: Plan of Care Review  Outcome: Progressing  Goal: Patient-Specific Goal (Individualized)  Outcome: Progressing  Goal: Absence of New-Onset Illness or Injury  Outcome: Progressing  Intervention: Prevent Fall and Fall Injury  Recent Flowsheet Documentation  Taken 11/21/2022 1600 by Earlie Counts, RN  Safety Interventions: fall reduction program maintained  Taken 11/21/2022 1400 by Earlie Counts, RN  Safety Interventions: fall reduction program maintained  Taken 11/21/2022 1200 by Earlie Counts, RN  Safety Interventions: fall reduction program maintained  Taken 11/21/2022 1000 by Earlie Counts, RN  Safety Interventions: fall reduction program maintained  Taken 11/21/2022 0800 by Earlie Counts, RN  Safety Interventions: fall reduction program maintained  Goal: Optimal Comfort and Wellbeing  Outcome: Progressing  Goal: Home and Community Transition Plan Established  Outcome: Progressing  Goal: Rounds/Family Conference  Outcome: Progressing

## 2022-11-21 NOTE — Unmapped (Signed)
AA&O x4. Medicated with Phenergan IVPB for c/o nausea and Dilaudid IVP for c/o low back and shoulder pain, effective for relief. Taken to bathroom with sara steady, voiding qs, loose brown BM x1 this shift. Set up for meals, appetite good, Tube feeding infuses during night.

## 2022-11-22 MED ADMIN — furosemide (LASIX) injection 5 mg: 5 mg | INTRAVENOUS | @ 19:00:00 | Stop: 2022-11-22

## 2022-11-22 MED ADMIN — multivitamin with folic acid 400 mcg tablet 1 tablet: 1 | ORAL | @ 14:00:00

## 2022-11-22 MED ADMIN — gabapentin (NEURONTIN) capsule 100 mg: 100 mg | ORAL | @ 14:00:00

## 2022-11-22 MED ADMIN — promethazine (PHENERGAN) 6.5 mg in sodium chloride (NS) 0.9 % 50 mL IVPB: 6.5 mg | INTRAVENOUS | @ 03:00:00

## 2022-11-22 MED ADMIN — diclofenac sodium (VOLTAREN) 1 % gel 2 g: 2 g | TOPICAL | @ 02:00:00

## 2022-11-22 MED ADMIN — HYDROmorphone (PF) (DILAUDID) injection 0.5 mg: .5 mg | INTRAVENOUS | @ 11:00:00 | Stop: 2022-11-24

## 2022-11-22 MED ADMIN — buprenorphine-naloxone (SUBOXONE) 2-0.5 mg SL film 1 mg of buprenorphine: .5 | SUBLINGUAL | @ 18:00:00

## 2022-11-22 MED ADMIN — lidocaine (ASPERCREME) 4 % 2 patch: 2 | TRANSDERMAL | @ 14:00:00

## 2022-11-22 MED ADMIN — diazePAM (VALIUM) tablet 2.5 mg: 2.5 mg | ORAL | @ 14:00:00

## 2022-11-22 MED ADMIN — diazePAM (VALIUM) tablet 2.5 mg: 2.5 mg | ORAL | @ 18:00:00

## 2022-11-22 MED ADMIN — nirogacestat (OGSIVEO) tablet 100 mg **Patient Supplied**: 100 mg | ORAL | @ 11:00:00 | Stop: 2022-11-26

## 2022-11-22 MED ADMIN — oxyCODONE (ROXICODONE) immediate release tablet 15 mg: 15 mg | ORAL | @ 16:00:00 | Stop: 2022-11-28

## 2022-11-22 MED ADMIN — carboxymethylcellulose sodium (REFRESH CELLUVISC) 1 % ophthalmic gel 1 drop: 1 [drp] | OPHTHALMIC | @ 23:00:00

## 2022-11-22 MED ADMIN — HYDROmorphone (PF) (DILAUDID) injection 0.5 mg: .5 mg | INTRAVENOUS | @ 02:00:00 | Stop: 2022-11-24

## 2022-11-22 MED ADMIN — oxyCODONE (ROXICODONE) immediate release tablet 15 mg: 15 mg | ORAL | @ 23:00:00 | Stop: 2022-11-28

## 2022-11-22 MED ADMIN — pantoprazole (Protonix) EC tablet 40 mg: 40 mg | ORAL | @ 02:00:00

## 2022-11-22 MED ADMIN — nirogacestat (OGSIVEO) tablet 100 mg **Patient Supplied**: 100 mg | ORAL | @ 23:00:00 | Stop: 2022-11-26

## 2022-11-22 MED ADMIN — buprenorphine-naloxone (SUBOXONE) 2-0.5 mg SL film 1 mg of buprenorphine: .5 | SUBLINGUAL | @ 14:00:00

## 2022-11-22 MED ADMIN — HYDROmorphone (PF) (DILAUDID) injection 0.5 mg: .5 mg | INTRAVENOUS | @ 18:00:00 | Stop: 2022-11-24

## 2022-11-22 MED ADMIN — diclofenac sodium (VOLTAREN) 1 % gel 2 g: 2 g | TOPICAL | @ 14:00:00

## 2022-11-22 MED ADMIN — ondansetron (ZOFRAN) injection 4 mg: 4 mg | INTRAVENOUS | @ 11:00:00

## 2022-11-22 MED ADMIN — carboxymethylcellulose sodium (REFRESH CELLUVISC) 1 % ophthalmic gel 1 drop: 1 [drp] | OPHTHALMIC | @ 02:00:00

## 2022-11-22 MED ADMIN — famotidine (PEPCID) tablet 20 mg: 20 mg | ORAL | @ 02:00:00

## 2022-11-22 MED ADMIN — gabapentin (NEURONTIN) capsule 100 mg: 100 mg | ORAL | @ 18:00:00

## 2022-11-22 MED ADMIN — naloxegol (MOVANTIK) 12.5 mg tablet 12.5 mg: 12.5 mg | ORAL | @ 14:00:00

## 2022-11-22 MED ADMIN — carboxymethylcellulose sodium (REFRESH CELLUVISC) 1 % ophthalmic gel 1 drop: 1 [drp] | OPHTHALMIC | @ 14:00:00

## 2022-11-22 MED ADMIN — pantoprazole (Protonix) EC tablet 40 mg: 40 mg | ORAL | @ 14:00:00

## 2022-11-22 MED ADMIN — gabapentin (NEURONTIN) capsule 100 mg: 100 mg | ORAL | @ 02:00:00

## 2022-11-22 MED ADMIN — buprenorphine-naloxone (SUBOXONE) 2-0.5 mg SL film 1 mg of buprenorphine: .5 | SUBLINGUAL | @ 02:00:00

## 2022-11-22 MED ADMIN — diazePAM (VALIUM) tablet 5 mg: 5 mg | ORAL | @ 02:00:00

## 2022-11-22 MED ADMIN — carboxymethylcellulose sodium (REFRESH CELLUVISC) 1 % ophthalmic gel 1 drop: 1 [drp] | OPHTHALMIC | @ 16:00:00

## 2022-11-22 NOTE — Unmapped (Signed)
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 Admitted to [facility] on 09/25/2022 for ileus, inability to tolerate PO now on supplemental tube feed, and diffuse pain due to muscle spasms of uncertain etiology.      Rehab Impairment Group Code [facility]): (Debility) 16 Debility (Non-Cardiac/Non-Pulmonary)  Etiology: Diffuse pain due to muscle spasms of uncertain etiology and cutaneous desmoid tumors    PLAN:     This patient is admitted to the Physical Medicine and Rehabilitation - Inpatient - C service from 8am-5pm on weekdays for questions regarding this patient. After hours, weekends, and holidays please contact the 1st Call resident pager     REHAB:   - PT and OT to maximize functional status with mobility and ADLs as well as prevention of joint contracture.   - Neuropsych for higher level cognitive evaluation and coping.  - RT for community re-integration, education, and leisure support services.  - Nutrition consult for diet information/teaching.   - To be discussed in weekly Interdisciplinary Team Conference.     Increased Tonicity  Dystonia   Started post-procedurally per patient ~09/25/22. Occurs intermittently throughout the day with a heightened occurrence during mobilization. At rest patient bilateral ankles in plantarflexion with worsening heel cord tightening. Has tonicity most prominent to HF and KE that is not velocity dependent. Has not noted significant improvement with increase of baclofen to 15 mg TID in setting of infection as below. Neurology previously evaluated patient seemingly ruling out an acute dystonic reaction, cord compression, central process, nutritional deficiency, stiff person syndrome and [name] after normal EMG on 8/22. Continues to be well controlled with valium, small improvement with baclofen PRN.  - Continue Baclofen 10 mg TID prn  - Continued Valium 2.5/2.5/5 mg TID and 2.5 mg nightly PRN  - PRAFO boots in bed     Headache  Currently managed with tylenol. She does have some posterior neck muscle tightness. Notes ketamine is helpful for headache pains. Describes photophobia and phonophobia with events. She does have loss of cervical lordosis on most recent MRI.   - Consider starting propranolol 5 mg BID for headache prophylaxis.   - Prn tylenol +/- prn fiorcet   - Lidocaine cream PRN to area of pain for cervicogenic headaches     Pain  Pain management consulted with recommendations as below. Patient will need to follow up with outpatient chronic pain provider ([name]).  - Tylenol 650 mg q6h PRN  - IV Dilaudid 0.5 mg q6h PRN; will wean as tolerated.  - Oxyocodone 10-15 mg q4h PRN  - Suboxone 2-0.5 mg TID  - Continue Gabapentin 100 mg TID, can increase to 300mg  TID as tolerated  - Voltaren gel QID, lidocaine patches  - Continue patellar taping with therapy or obtain patellar brace to help with pain with ambulation  - Continue Movantik 12.5mg  daily; discontinue upon discharge from AIR     Multifactorial Ileus   Nausea (chronic)  Coffee [employer] Emesis 2/2 NGT placement (resolved)  Contributors with history of narcotics, poor PO intake. Concern for malnutrition. Improvements to abdominal pain since weaning opioids. TPN has been discontinued with patient now tolerating tube feeds. Patient with chronic nausea that was previously managed with Zofran, though transitioned to phenergan due to prolonged Qtc. Upon admission to AIR,  continued nightly tube feeds with Corpak NG tube in place, and PO intake as tolerated during the day. Will continue to decrease tube feeds with goal of discharge from AIR on full regular diet. If unable to tolerate full PO diet, will consider discharge home with corpak. Patient noted weight gain upon arrival to AIR (felt likely fluid retention) as weight was increased in a short period of time.   - Dietician following; appreciate care and recommendations  - Continue nightly tube feeds  - Continue PO regular diet + protein supplementation; anticipate NG tube removal prior to discharge   - START calorie count 9/13  - START daily weights 9/13  - Continue Protonix 40mg  BID  - Continue IV phenergan 6.5mg  Q6 PRN; will continue to monitor and consider scopolamine patch in hopes of transitioning off IV  [ ]  restart home cefdinir and doxycycline upon discharge from AIR for SIBP     Syncope  Multifactorial. Primary team ddx includes: vasovagal vs adverse effect of medications vs deconditioning vs iron deficiency. Last echo 04/2022. Stable upon admission to AIR.   - CTM     IDA  - Has allergy to formula iron      [name] - Desmoid Tumors  Follows [facility] (Dr. [name]) is patient's outpatient oncologist.   - Continue Nirogacestat 100mg  BID  - M/Th CMP; monitor LFT  [ ]  increase nirogacestat to 150mg  BID on 9/17 per oncology     Enterobacteriaceae group bacteremia (Resolved) - C albicans fungemia (Resolved)  Likely due to RIJ tunneled line placed 01/2022 now removed on 8/14.  8/13, 8/17 repeat blood cultures NGTD.  No evidence of vitreitis, retinitis 8/15 per Ophtho.  TTE 8/15 w possible vegetations on MV and TV or indwelling catheter; however, TEE 8/20 w/o vegetations. ID consulted during acute admission and now s/p treatment with cephalosporin and fluconazole on 8/31. Patient noted to have difficult IV access and kept R IJ access upon admission to AIR.   [ ]  Remove R internal jugular access when able to wean off IV meds.     Generalized anxiety disorder, depression, h/o panic attacks  Psychiatry consulted during acute hospitalization with recommendations as below.   - Consider decreasing mirtazapine 15 mg nightly to 7.5 mg nightly due to somnolence  - Valium as above, if needed could give 2.5mg  PRN for acute anxiety. Daily Checklist  - Diet: Regular diet + nighttime tube feeds  - DVT PPX: SQ enoxaparin   - GI PPX: Protonix  - Access: R IJ non tunneled CVC. Necessary for IV medications; will remove when able.    DISPO: Patient to be discussed at weekly interdisciplinary team conference.   - EDD: 12/10/2022  - Follow-up: PCP, Chronic Pain, Oncology (Dr. [name]), Psych    SUBJECTIVE and PROGRESS WITH FUNCTIONAL ACTIVITIES:     Interval Events:     Weekend Events:     11/22/22:   NAEON. Patient with worsening edema of the legs, states she is feeling uncomfortable with this. Plan for lasix 5mg  IV today and reassess tomorrow. Patient also requesting nail care for a possible fungal infection of the left great toe.     OBJECTIVE:     Vital signs (last 24 hours):  Temp:  [36.5 ??C (97.7 ??F)-36.8 ??C (98.2 ??F)] 36.8 ??C (98.2 ??F)  Heart Rate:  [95-108] 108  Resp:  [16-18] 16  BP: (110-113)/(60-68) 110/60  MAP (mmHg):  [73-80] 73  SpO2:  [99 %-100 %] 100 %    Intake/Output (last  3 shifts):  I/O last 3 completed shifts:  In: 570 [P.O.:420; I.V.:30; NG/GT:120]  Out: 2400 [Urine:2400]    Physical Exam:   GEN: Sitting comfortably in bed in no acute distress, mother at bedside.  EYES: sclera anicteric, conjunctiva clear   HEENT: corpak in place L nare. R I.J. in place (nontunneled)  CV: warm extremities, well perfused, no cyanosis, no pitting edema appreciated in bilateral lower extremities  RESP:  NWOB on room air ([place] in place)  ABD: Not distended  SKIN: No visible masses, lesions, rashes, ecchymoses. Mild swelling of bilateral feet  MSK: Not visualized today, previously sacrum with some soft tissue swelling near lumbosacral junction. Mild TTP in surrounding tissues. No erythema, bruising or skin breakdown  NEURO: Alert, speech fluent  PSYCH: Mood euthymic, affect appropriate, tearful    Medications:  Scheduled    buprenorphine-naloxone (SUBOXONE) 2-0.5 mg SL film 1 mg of buprenorphine TID    carboxymethylcellulose sodium (REFRESH CELLUVISC) 1 % ophthalmic gel 1 drop QID    diazePAM (VALIUM) tablet 2.5 mg Daily    And    diazePAM (VALIUM) tablet 2.5 mg Daily    diazePAM (VALIUM) tablet 5 mg Nightly    diclofenac sodium (VOLTAREN) 1 % gel 2 g QID    enoxaparin (LOVENOX) syringe 40 mg Q24H SCH    famotidine (PEPCID) tablet 20 mg Nightly    furosemide (LASIX) injection 5 mg Once    gabapentin (NEURONTIN) capsule 100 mg TID    lidocaine (ASPERCREME) 4 % 2 patch Daily    mirtazapine (REMERON) tablet 15 mg Nightly    multivitamin with folic acid 400 mcg tablet 1 tablet Daily    naloxegol (MOVANTIK) 12.5 mg tablet 12.5 mg Daily    nirogacestat (OGSIVEO) tablet 100 mg **Patient Supplied** BID    pantoprazole (Protonix) EC tablet 40 mg BID     PRN acetaminophen, 650 mg, Q6H PRN  alum-mag-simeth, 60 mL, Q6H PRN  baclofen, 10 mg, TID PRN  bisacodyl, 10 mg, BID PRN  butalbital-acetaminophen-caffeine, 1 tablet, BID PRN  carboxymethylcellulose sodium, 2 drop, QID PRN  Chemo Clarification Order, , Continuous PRN  Chemo Clarification Order, , Continuous PRN  diazePAM, 2.5 mg, Nightly PRN  guaiFENesin, 200 mg, Q4H PRN  hydrocortisone, , BID PRN  HYDROmorphone, 0.5 mg, Q6H PRN  IP okay to treat, , Continuous PRN  lidocaine-diphenhydrAMINE-aluminum-magnesium, 10 mL, QID PRN  ondansetron, 4 mg, Q12H PRN  oxyCODONE, 10 mg, Q4H PRN   Or  oxyCODONE, 15 mg, Q4H PRN  phenol, 2 spray, Q2H PRN  polyethylene glycol, 17 g, BID PRN  promethazine, 6.5 mg, Q6H PRN  pseudoephedrine, 30 mg, Daily PRN  senna, 1 tablet, BID PRN  zinc oxide-cod liver oil, , Daily PRN        Labs/Studies: Reviewed.    Radiology Results: Reviewed    Quality Indicators      Hearing, Speech, and Vision  Ability to Hear: Adequate  Ability to See in Adequate Light: Adequate  Expression of Ideas and Wants: Without difficulty  Understanding Verbal and Non-Verbal Content: Understands    Cognitive Pattern Assessment  Cognitive Pattern Assessment Used: BIMS  Brief Interview for Mental Status (BIMS)  Repetition of Three Words (First Attempt): 3  Temporal Orientation: Year: Correct  Temporal Orientation: Month: Accurate within 5 days  Temporal Orientation: Day: Correct  Recall: Sock: Yes, no cue required  Recall: Blue: Yes, no cue required  Recall: Bed: Yes, no cue required  BIMS Summary Score: 15  Nutritional Approaches  Nutritional Approach: Feeding tube    ADLs  Admission Current   Eating Assistance Needed: Set-up / clean-up    CARE Score - 5 Assistance Needed: Set-up / clean-up    CARE Score - 5   Oral Hygiene  Assistance Needed: Set-up / clean-up    CARE Score - 5  Assistance Needed: Set-up / clean-up    CARE Score - 5   Bladder Continence       Bowel Continence       Toileting Hygiene Assistance Needed: Physical assistance Total assistance  CARE Score - 1 Assistance Needed: Physical assistance    CARE Score - 1    Toilet Transfer      CARE Score - 88      CARE Score - 88   Shower/Bathe Self      CARE Score - 88       CARE Score - 88   Upper Body Dressing Assistance Needed: Physical assistance 26%-50%  CARE Score - 3  Assistance Needed: Physical assistance 76% or more  CARE Score - 2   Lower Body Dressing Assistance Needed: Physical assistance Total assistance  CARE Score - 1  Assistance Needed: Physical assistance Total assistance  CARE Score - 1   On/Off Footwear Assistance Needed: Physical assistance Total assistance  CARE Score - 1  Assistance Needed: Physical assistance Total assistance  CARE Score - 1         Transfers Admission Current   Bed to Chair Assistance Needed: Physical assistance Total assistance  CARE Score - 1  Assistance Needed: Physical assistance    CARE Score - 1     Lying to Sitting Assistance Needed: Physical assistance Total assistance  CARE Score - 1  Assistance Needed: Physical assistance    CARE Score - 1   Roll Left/Right Assistance Needed: Physical assistance 76% or more  CARE Score - 2  Assistance Needed: Physical assistance    CARE Score - 2   Sit to Lying Assistance Needed: Physical assistance Total assistance  CARE Score - 1  Assistance Needed: Physical assistance    CARE Score - 1   Sit to Stand Assistance Needed: Physical assistance Total assistance  CARE Score - 1  Assistance Needed: Physical assistance    CARE Score - 1         Mobility Admission Current   Walk 10 Feet Assistance Needed: Physical assistance Total assistance  CARE Score - 1 Assistance Needed: Physical assistance    CARE Score - 1     Walk 50 Feet 2 Turns      CARE Score - 88      CARE Score - 88   Walk 150 Feet      CARE Score - 88       CARE Score - 88   Walk 10 Feet Uneven      CARE Score - 88       CARE Score - 88   1 Step (Curb)      CARE Score - 88       CARE Score - 88   4 Steps      CARE Score - 88       CARE Score - 88   12 Steps      CARE Score - 88       CARE Score - 88   Picking Up Object      CARE Score - 88       CARE  Score - 88   Wheelchair/Scooter Use        Wheel 50 Feet 2 Turns Assistance Needed: Physical assistance Total assistance    CARE Score - Wheel 50 Feet with Two Turns: 1    Type of Wheelchair/Scooter: Manual Assistance Needed: Physical assistance Total assistance    CARE Score - Wheel 50 Feet with Two Turns: 1    Type of Wheelchair/Scooter: Manual   Wheel 150 Feet Assistance Needed: Physical assistance Total assistance    CARE Score - Wheel 150 Feet: 1    Type of Wheelchair/Scooter: Manual  Assistance Needed: Physical assistance Total assistance    CARE Score - Wheel 150 Feet: 1    Type of Wheelchair/Scooter: Manual

## 2022-11-22 NOTE — Unmapped (Addendum)
Patient is alert and oriented x 4.Mother at bedside.Right IJ central line patent with good blood return;dressing dry and intact. NS infusion continued at 75ml/hr. Continues to complain moderate to severe pain and nausea. Tolerates tube feeding and regular diet; meds given with apple sauce. Out of bed assist x 2-3 with Corene Cornea.Continent on bowel and bladder;Last BM: 9/14. Aspiration and fall precautions maintained. VS stable.Skin remains intact. Will continue to monitor.      Problem: Rehabilitation (IRF) Plan of Care  Goal: Plan of Care Review  Outcome: Progressing  Goal: Patient-Specific Goal (Individualized)  Outcome: Progressing  Goal: Absence of New-Onset Illness or Injury  Outcome: Progressing  Intervention: Prevent Fall and Fall Injury  Recent Flowsheet Documentation  Taken 11/22/2022 0400 by Loann Quill, RN  Safety Interventions:   fall reduction program maintained   family at bedside  Taken 11/22/2022 0200 by Loann Quill, RN  Safety Interventions:   fall reduction program maintained   aspiration precautions   family at bedside  Taken 11/21/2022 2200 by Loann Quill, RN  Safety Interventions:   fall reduction program maintained   family at bedside  Taken 11/21/2022 2000 by Loann Quill, RN  Safety Interventions:   fall reduction program maintained   family at bedside  Intervention: Prevent Skin Injury  Recent Flowsheet Documentation  Taken 11/22/2022 0400 by Loann Quill, RN  Device Skin Pressure Protection: absorbent pad utilized/changed  Skin Protection: incontinence pads utilized  Taken 11/22/2022 0200 by Loann Quill, RN  Device Skin Pressure Protection: absorbent pad utilized/changed  Skin Protection: incontinence pads utilized  Taken 11/21/2022 2200 by Loann Quill, RN  Device Skin Pressure Protection: absorbent pad utilized/changed  Skin Protection: incontinence pads utilized  Taken 11/21/2022 2000 by Loann Quill, RN  Device Skin Pressure Protection: absorbent pad utilized/changed  Skin Protection: incontinence pads utilized  Intervention: Prevent Infection  Recent Flowsheet Documentation  Taken 11/21/2022 2000 by Loann Quill, RN  Infection Prevention:   environmental surveillance performed   hand hygiene promoted   equipment surfaces disinfected   rest/sleep promoted  Intervention: Prevent VTE (Venous Thromboembolism)  Recent Flowsheet Documentation  Taken 11/21/2022 2130 by Loann Quill, RN  VTE Prevention/Management: fluids promoted  Goal: Optimal Comfort and Wellbeing  Outcome: Progressing  Goal: Home and Community Transition Plan Established  Outcome: Progressing  Goal: Rounds/Family Conference  Outcome: Progressing     Problem: Self-Care Deficit  Goal: Improved Ability to Complete Activities of Daily Living  Outcome: Progressing  Intervention: Promote Activity and Functional Independence  Recent Flowsheet Documentation  Taken 11/21/2022 2130 by Loann Quill, RN  Self-Care Promotion:   independence encouraged   adaptive equipment use encouraged   meal set-up provided     Problem: Skin Injury Risk Increased  Goal: Skin Health and Integrity  Outcome: Progressing  Intervention: Optimize Skin Protection  Recent Flowsheet Documentation  Taken 11/22/2022 0400 by Loann Quill, RN  Pressure Reduction Techniques: frequent weight shift encouraged  Pressure Reduction Devices: positioning supports utilized  Skin Protection: incontinence pads utilized  Taken 11/22/2022 0200 by Amarien Carne Karlyn Agee, RN  Pressure Reduction Techniques: frequent weight shift encouraged  Head of Bed (HOB) Positioning: HOB at 30-45 degrees  Pressure Reduction Devices: positioning supports utilized  Skin Protection: incontinence pads utilized  Taken 11/21/2022 2200 by Mindi Akerson Karlyn Agee, RN  Pressure Reduction Techniques: frequent weight shift encouraged  Pressure Reduction Devices: positioning supports utilized  Skin Protection: incontinence pads utilized  Taken 11/21/2022 2000 by  Tamakia Porto [place], RN  Pressure Reduction Techniques: frequent weight shift encouraged  Pressure Reduction Devices: positioning supports utilized  Skin Protection: incontinence pads utilized  Intervention: Promote and Optimize Oral Intake  Recent Flowsheet Documentation  Taken 11/21/2022 2013 by Loann Quill, RN  Oral Nutrition Promotion:   adaptive equipment use encouraged   physical activity promoted   rest periods promoted     Problem: Pain Acute  Goal: Optimal Pain Control and Function  Outcome: Progressing     Problem: Fall Injury Risk  Goal: Absence of Fall and Fall-Related Injury  Outcome: Progressing  Intervention: Identify and Manage Contributors  Recent Flowsheet Documentation  Taken 11/21/2022 2130 by Loann Quill, RN  Self-Care Promotion:   independence encouraged   adaptive equipment use encouraged   meal set-up provided  Intervention: Promote Injury-Free Environment  Recent Flowsheet Documentation  Taken 11/22/2022 0400 by Loann Quill, RN  Safety Interventions:   fall reduction program maintained   family at bedside  Taken 11/22/2022 0200 by Loann Quill, RN  Safety Interventions:   fall reduction program maintained   aspiration precautions   family at bedside  Taken 11/21/2022 2200 by Loann Quill, RN  Safety Interventions:   fall reduction program maintained   family at bedside  Taken 11/21/2022 2000 by Loann Quill, RN  Safety Interventions:   fall reduction program maintained   family at bedside

## 2022-11-22 NOTE — Unmapped (Signed)
Received pt lying comfortably in bed, awake, alert and oriented x 4. No distress noted. Mother inside the room.  Cont to bowel and bladder. Cortract on left nares intact. Cont TF dc'ed at 8 Am. Right Intra jugular central line CDI with ongoing IVF NS x 86ml/hr. PRN pain meds given to control pain. Due meds given. Skin injury prevention, fall and aspiration precaution observed. Needs attended. Made pt comfortable. Plan of care continued.     Problem: Rehabilitation (IRF) Plan of Care  Goal: Plan of Care Review  Outcome: Progressing  Flowsheets (Taken 11/22/2022 1652)  Progress: improving  Plan of Care Reviewed With: patient  Goal: Patient-Specific Goal (Individualized)  Outcome: Progressing  Goal: Absence of New-Onset Illness or Injury  Outcome: Progressing  Intervention: Prevent Fall and Fall Injury  Recent Flowsheet Documentation  Taken 11/22/2022 1200 by Hale Drone, RN  Safety Interventions:   fall reduction program maintained   low bed  Taken 11/22/2022 1000 by Hale Drone, RN  Safety Interventions:   fall reduction program maintained   low bed  Taken 11/22/2022 0800 by Hale Drone, RN  Safety Interventions:   fall reduction program maintained   low bed  Intervention: Prevent Skin Injury  Recent Flowsheet Documentation  Taken 11/22/2022 1200 by Hale Drone, RN  Device Skin Pressure Protection: absorbent pad utilized/changed  Skin Protection: incontinence pads utilized  Taken 11/22/2022 1000 by Hale Drone, RN  Device Skin Pressure Protection: absorbent pad utilized/changed  Skin Protection: incontinence pads utilized  Taken 11/22/2022 0800 by Hale Drone, RN  Device Skin Pressure Protection: absorbent pad utilized/changed  Skin Protection: incontinence pads utilized  Intervention: Prevent Infection  Recent Flowsheet Documentation  Taken 11/22/2022 1200 by Hale Drone, RN  Infection Prevention:   hand hygiene promoted   rest/sleep promoted   single patient room provided  Taken 11/22/2022 1000 by Hale Drone, RN  Infection Prevention: hand hygiene promoted  Taken 11/22/2022 0800 by Hale Drone, RN  Infection Prevention:   hand hygiene promoted   rest/sleep promoted   single patient room provided  Intervention: Prevent VTE (Venous Thromboembolism)  Recent Flowsheet Documentation  Taken 11/22/2022 1015 by Hale Drone, RN  VTE Prevention/Management: anticoagulant therapy  Goal: Optimal Comfort and Wellbeing  Outcome: Progressing  Goal: Home and Community Transition Plan Established  Outcome: Progressing  Goal: Rounds/Family Conference  Outcome: Progressing     Problem: Self-Care Deficit  Goal: Improved Ability to Complete Activities of Daily Living  Outcome: Progressing     Problem: Skin Injury Risk Increased  Goal: Skin Health and Integrity  Outcome: Progressing  Intervention: Optimize Skin Protection  Recent Flowsheet Documentation  Taken 11/22/2022 1200 by Hale Drone, RN  Pressure Reduction Techniques:   frequent weight shift encouraged   heels elevated off bed  Pressure Reduction Devices: pressure-redistributing mattress utilized  Skin Protection: incontinence pads utilized  Taken 11/22/2022 1000 by Hale Drone, RN  Pressure Reduction Techniques:   heels elevated off bed   frequent weight shift encouraged  Pressure Reduction Devices: pressure-redistributing mattress utilized  Skin Protection: incontinence pads utilized  Taken 11/22/2022 0800 by Hale Drone, RN  Pressure Reduction Techniques:   frequent weight shift encouraged   heels elevated off bed  Pressure Reduction Devices: pressure-redistributing mattress utilized  Skin Protection: incontinence pads utilized     Problem: Pain Acute  Goal: Optimal Pain Control and Function  Outcome: Progressing     Problem: Fall Injury Risk  Goal: Absence  of Fall and Fall-Related Injury  Outcome: Progressing  Intervention: Promote Scientist, clinical (histocompatibility and immunogenetics) Documentation  Taken 11/22/2022 1200 by Hale Drone, RN  Safety Interventions:   fall reduction program maintained   low bed  Taken 11/22/2022 1000 by Hale Drone, RN  Safety Interventions:   fall reduction program maintained   low bed  Taken 11/22/2022 0800 by Hale Drone, RN  Safety Interventions:   fall reduction program maintained   low bed

## 2022-11-23 MED ADMIN — pantoprazole (Protonix) EC tablet 40 mg: 40 mg | ORAL | @ 13:00:00

## 2022-11-23 MED ADMIN — gabapentin (NEURONTIN) capsule 100 mg: 100 mg | ORAL | @ 01:00:00

## 2022-11-23 MED ADMIN — multivitamin with folic acid 400 mcg tablet 1 tablet: 1 | ORAL | @ 13:00:00

## 2022-11-23 MED ADMIN — lidocaine (ASPERCREME) 4 % 2 patch: 2 | TRANSDERMAL | @ 13:00:00

## 2022-11-23 MED ADMIN — buprenorphine-naloxone (SUBOXONE) 2-0.5 mg SL film 1 mg of buprenorphine: .5 | SUBLINGUAL | @ 13:00:00

## 2022-11-23 MED ADMIN — carboxymethylcellulose sodium (REFRESH CELLUVISC) 1 % ophthalmic gel 1 drop: 1 [drp] | OPHTHALMIC | @ 01:00:00

## 2022-11-23 MED ADMIN — diazePAM (VALIUM) tablet 5 mg: 5 mg | ORAL | @ 01:00:00

## 2022-11-23 MED ADMIN — carboxymethylcellulose sodium (REFRESH CELLUVISC) 1 % ophthalmic gel 1 drop: 1 [drp] | OPHTHALMIC | @ 13:00:00

## 2022-11-23 MED ADMIN — pantoprazole (Protonix) EC tablet 40 mg: 40 mg | ORAL | @ 01:00:00

## 2022-11-23 MED ADMIN — HYDROmorphone (PF) (DILAUDID) injection 0.5 mg: .5 mg | INTRAVENOUS | @ 23:00:00 | Stop: 2022-11-24

## 2022-11-23 MED ADMIN — naloxegol (MOVANTIK) 12.5 mg tablet 12.5 mg: 12.5 mg | ORAL | @ 13:00:00

## 2022-11-23 MED ADMIN — diclofenac sodium (VOLTAREN) 1 % gel 2 g: 2 g | TOPICAL | @ 17:00:00

## 2022-11-23 MED ADMIN — gabapentin (NEURONTIN) capsule 100 mg: 100 mg | ORAL | @ 17:00:00

## 2022-11-23 MED ADMIN — famotidine (PEPCID) tablet 20 mg: 20 mg | ORAL | @ 01:00:00

## 2022-11-23 MED ADMIN — buprenorphine-naloxone (SUBOXONE) 2-0.5 mg SL film 1 mg of buprenorphine: .5 | SUBLINGUAL | @ 17:00:00

## 2022-11-23 MED ADMIN — ondansetron (ZOFRAN) injection 4 mg: 4 mg | INTRAVENOUS | @ 01:00:00

## 2022-11-23 MED ADMIN — oxyCODONE (ROXICODONE) immediate release tablet 15 mg: 15 mg | ORAL | @ 20:00:00 | Stop: 2022-11-28

## 2022-11-23 MED ADMIN — buprenorphine-naloxone (SUBOXONE) 2-0.5 mg SL film 1 mg of buprenorphine: .5 | SUBLINGUAL | @ 01:00:00

## 2022-11-23 MED ADMIN — diazePAM (VALIUM) tablet 2.5 mg: 2.5 mg | ORAL | @ 12:00:00

## 2022-11-23 MED ADMIN — HYDROmorphone (PF) (DILAUDID) injection 0.5 mg: .5 mg | INTRAVENOUS | @ 11:00:00 | Stop: 2022-11-24

## 2022-11-23 MED ADMIN — gabapentin (NEURONTIN) capsule 100 mg: 100 mg | ORAL | @ 13:00:00

## 2022-11-23 MED ADMIN — carboxymethylcellulose sodium (REFRESH CELLUVISC) 1 % ophthalmic gel 1 drop: 1 [drp] | OPHTHALMIC | @ 22:00:00

## 2022-11-23 MED ADMIN — carboxymethylcellulose sodium (REFRESH CELLUVISC) 1 % ophthalmic gel 1 drop: 1 [drp] | OPHTHALMIC | @ 17:00:00

## 2022-11-23 MED ADMIN — ondansetron (ZOFRAN) injection 4 mg: 4 mg | INTRAVENOUS | @ 13:00:00

## 2022-11-23 MED ADMIN — nirogacestat (OGSIVEO) tablet 100 mg **Patient Supplied**: 100 mg | ORAL | @ 23:00:00 | Stop: 2022-11-26

## 2022-11-23 MED ADMIN — diclofenac sodium (VOLTAREN) 1 % gel 2 g: 2 g | TOPICAL | @ 01:00:00

## 2022-11-23 MED ADMIN — furosemide (LASIX) injection 5 mg: 5 mg | INTRAVENOUS | @ 18:00:00 | Stop: 2022-11-23

## 2022-11-23 MED ADMIN — nirogacestat (OGSIVEO) tablet 100 mg **Patient Supplied**: 100 mg | ORAL | @ 11:00:00 | Stop: 2022-11-26

## 2022-11-23 MED ADMIN — diclofenac sodium (VOLTAREN) 1 % gel 2 g: 2 g | TOPICAL | @ 22:00:00

## 2022-11-23 MED ADMIN — HYDROmorphone (PF) (DILAUDID) injection 0.5 mg: .5 mg | INTRAVENOUS | @ 02:00:00 | Stop: 2022-11-24

## 2022-11-23 MED ADMIN — oxyCODONE (ROXICODONE) immediate release tablet 15 mg: 15 mg | ORAL | @ 13:00:00 | Stop: 2022-11-28

## 2022-11-23 MED ADMIN — diclofenac sodium (VOLTAREN) 1 % gel 2 g: 2 g | TOPICAL | @ 13:00:00

## 2022-11-23 MED ADMIN — diazePAM (VALIUM) tablet 2.5 mg: 2.5 mg | ORAL | @ 17:00:00

## 2022-11-23 MED ADMIN — HYDROmorphone (PF) (DILAUDID) injection 0.5 mg: .5 mg | INTRAVENOUS | @ 17:00:00 | Stop: 2022-11-24

## 2022-11-23 NOTE — Unmapped (Signed)
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 Admitted to [facility] on 09/25/2022 for ileus, inability to tolerate PO now on supplemental tube feed, and diffuse pain due to muscle spasms of uncertain etiology.      Rehab Impairment Group Code [facility]): (Debility) 16 Debility (Non-Cardiac/Non-Pulmonary)  Etiology: Diffuse pain due to muscle spasms of uncertain etiology and cutaneous desmoid tumors    PLAN:     This patient is admitted to the Physical Medicine and Rehabilitation - Inpatient - C service from 8am-5pm on weekdays for questions regarding this patient. After hours, weekends, and holidays please contact the 1st Call resident pager     REHAB:   - PT and OT to maximize functional status with mobility and ADLs as well as prevention of joint contracture.   - Neuropsych for higher level cognitive evaluation and coping.  - RT for community re-integration, education, and leisure support services.  - Nutrition consult for diet information/teaching.   - To be discussed in weekly Interdisciplinary Team Conference.     Increased Tonicity  Dystonia   Started post-procedurally per patient ~09/25/22. Occurs intermittently throughout the day with a heightened occurrence during mobilization. At rest patient bilateral ankles in plantarflexion with worsening heel cord tightening. Has tonicity most prominent to HF and KE that is not velocity dependent. Has not noted significant improvement with increase of baclofen to 15 mg TID in setting of infection as below. Neurology previously evaluated patient seemingly ruling out an acute dystonic reaction, cord compression, central process, nutritional deficiency, stiff person syndrome and [name] after normal EMG on 8/22. Continues to be well controlled with valium, small improvement with baclofen PRN.  - Continue Baclofen 10 mg TID prn  - Continued Valium 2.5/2.5/5 mg TID and 2.5 mg nightly PRN  - PRAFO boots in bed     Headache  Currently managed with tylenol. She does have some posterior neck muscle tightness. Notes ketamine is helpful for headache pains. Describes photophobia and phonophobia with events. She does have loss of cervical lordosis on most recent MRI.   - Consider starting propranolol 5 mg BID for headache prophylaxis.   - Prn tylenol +/- prn fiorcet   - Lidocaine cream PRN to area of pain for cervicogenic headaches     Pain  Pain management consulted with recommendations as below. Patient will need to follow up with outpatient chronic pain provider ([name]).  - Tylenol 650 mg q6h PRN  - IV Dilaudid 0.5 mg q6h PRN; will wean as tolerated.  - Oxyocodone 10-15 mg q4h PRN  - Suboxone 2-0.5 mg TID  - Continue Gabapentin 100 mg TID, can increase to 300mg  TID as tolerated  - Voltaren gel QID, lidocaine patches  - Continue patellar taping with therapy or obtain patellar brace to help with pain with ambulation  - Continue Movantik 12.5mg  daily; discontinue upon discharge from AIR     Multifactorial Ileus   Nausea (chronic)  Coffee [employer] Emesis 2/2 NGT placement (resolved)  Contributors with history of narcotics, poor PO intake. Concern for malnutrition. Improvements to abdominal pain since weaning opioids. TPN has been discontinued with patient now tolerating tube feeds. Patient with chronic nausea that was previously managed with Zofran, though transitioned to phenergan due to prolonged Qtc. Upon admission to AIR,  continued nightly tube feeds with Corpak NG tube in place, and PO intake as tolerated during the day. Will continue to decrease tube feeds with goal of discharge from [facility] on full regular diet. If unable to tolerate full PO diet, will consider discharge home with corpak. Patient noted weight gain upon arrival to AIR (felt likely fluid retention) as weight was increased in a short period of time.   - Dietician following; appreciate care and recommendations  - Continue nightly tube feeds  - Continue PO regular diet + protein supplementation; anticipate NG tube removal prior to discharge   - START calorie count 9/13  - START daily weights 9/13  - Continue Protonix 40mg  BID  - Continue IV phenergan 6.5mg  Q6 PRN; will continue to monitor and consider scopolamine patch in hopes of transitioning off IV  [ ]  restart home cefdinir and doxycycline upon discharge from AIR for SIBP     Syncope  Multifactorial. Primary team ddx includes: vasovagal vs adverse effect of medications vs deconditioning vs iron deficiency. Last echo 04/2022. Stable upon admission to [facility].   - CTM     IDA  - Has allergy to formula iron      [name] - Desmoid Tumors  Follows [facility] (Dr. [name]) is patient's outpatient oncologist.   - Continue Nirogacestat 100mg  BID  - M/Th CMP; monitor LFT  [ ]  increase nirogacestat to 150mg  BID on 9/17 per oncology     Enterobacteriaceae group bacteremia (Resolved) - C albicans fungemia (Resolved)  Likely due to RIJ tunneled line placed 01/2022 now removed on 8/14.  8/13, 8/17 repeat blood cultures NGTD.  No evidence of vitreitis, retinitis 8/15 per Ophtho.  TTE 8/15 w possible vegetations on MV and TV or indwelling catheter; however, TEE 8/20 w/o vegetations. ID consulted during acute admission and now s/p treatment with cephalosporin and fluconazole on 8/31. Patient noted to have difficult IV access and kept R IJ access upon admission to AIR.   [ ]  Remove R internal jugular access when able to wean off IV meds.     Generalized anxiety disorder, depression, h/o panic attacks  Psychiatry consulted during acute hospitalization with recommendations as below.   - Consider decreasing mirtazapine 15 mg nightly to 7.5 mg nightly due to somnolence  - Valium as above, if needed could give 2.5mg  PRN for acute anxiety. Daily Checklist  - Diet: Regular diet + nighttime tube feeds  - DVT PPX: SQ enoxaparin   - GI PPX: Protonix  - Access: R IJ non tunneled CVC. Necessary for IV medications; will remove when able.    DISPO: Patient to be discussed at weekly interdisciplinary team conference.   - EDD: 12/10/2022  - Follow-up: PCP, Chronic Pain, Oncology (Dr. [name]), Psych    SUBJECTIVE and PROGRESS WITH FUNCTIONAL ACTIVITIES:     Interval Events:     Weekend Events:     11/22/22:   NAEON. Patient with worsening edema of the legs, states she is feeling uncomfortable with this. Plan for lasix 5mg  IV today and reassess tomorrow. Patient also requesting nail care for a possible fungal infection of the left great toe.     11/23/23: NAEON. Patient states lasix helped but she still feels moderately swollen. Plan to repeat the lasix 5mg  IV today. Patient curious about continuous telemetry to monitor heart rate. We encouraged her to ask there therapists to use the pulse ox during sessions.    OBJECTIVE:     Vital signs (last 24 hours):  Temp:  [  36.4 ??C (97.5 ??F)] 36.4 ??C (97.5 ??F)  Heart Rate:  [98] 98  Resp:  [17] 17  BP: (101)/(49) 101/49  MAP (mmHg):  [64] 64  SpO2:  [100 %] 100 %    Intake/Output (last 3 shifts):  I/O last 3 completed shifts:  In: 1080 [P.O.:420; I.V.:60; NG/GT:600]  Out: 2750 [Urine:2750]    Physical Exam:   GEN: Sitting comfortably in wheelchair, mother at bedside.  EYES: sclera anicteric, conjunctiva clear   HEENT: corpak in place L nare. R I.J. in place (nontunneled)  CV: warm extremities, well perfused, no cyanosis, no pitting edema appreciated in bilateral lower extremities  RESP:  NWOB on room air (Gann in place)  ABD: Not distended  SKIN: No visible masses, lesions, rashes, ecchymoses. Mild swelling of bilateral feet - improved from yesterday.   MSK: Not visualized today, previously sacrum with some soft tissue swelling near lumbosacral junction. Mild TTP in surrounding tissues. No erythema, bruising or skin breakdown  NEURO: Alert, speech fluent  PSYCH: Mood euthymic, affect appropriate, tearful    Medications:  Scheduled    buprenorphine-naloxone (SUBOXONE) 2-0.5 mg SL film 1 mg of buprenorphine TID    carboxymethylcellulose sodium (REFRESH CELLUVISC) 1 % ophthalmic gel 1 drop QID    diazePAM (VALIUM) tablet 2.5 mg Daily    And    diazePAM (VALIUM) tablet 2.5 mg Daily    diazePAM (VALIUM) tablet 5 mg Nightly    diclofenac sodium (VOLTAREN) 1 % gel 2 g QID    enoxaparin (LOVENOX) syringe 40 mg Q24H SCH    famotidine (PEPCID) tablet 20 mg Nightly    furosemide (LASIX) injection 5 mg Once    gabapentin (NEURONTIN) capsule 100 mg TID    lidocaine (ASPERCREME) 4 % 2 patch Daily    mirtazapine (REMERON) tablet 15 mg Nightly    multivitamin with folic acid 400 mcg tablet 1 tablet Daily    naloxegol (MOVANTIK) 12.5 mg tablet 12.5 mg Daily    nirogacestat (OGSIVEO) tablet 100 mg **Patient Supplied** BID    pantoprazole (Protonix) EC tablet 40 mg BID     PRN acetaminophen, 650 mg, Q6H PRN  alum-mag-simeth, 60 mL, Q6H PRN  baclofen, 10 mg, TID PRN  bisacodyl, 10 mg, BID PRN  butalbital-acetaminophen-caffeine, 1 tablet, BID PRN  carboxymethylcellulose sodium, 2 drop, QID PRN  Chemo Clarification Order, , Continuous PRN  Chemo Clarification Order, , Continuous PRN  diazePAM, 2.5 mg, Nightly PRN  guaiFENesin, 200 mg, Q4H PRN  hydrocortisone, , BID PRN  HYDROmorphone, 0.5 mg, Q6H PRN  IP okay to treat, , Continuous PRN  lidocaine-diphenhydrAMINE-aluminum-magnesium, 10 mL, QID PRN  ondansetron, 4 mg, Q12H PRN  oxyCODONE, 10 mg, Q4H PRN   Or  oxyCODONE, 15 mg, Q4H PRN  phenol, 2 spray, Q2H PRN  polyethylene glycol, 17 g, BID PRN  promethazine, 6.5 mg, Q6H PRN  pseudoephedrine, 30 mg, Daily PRN  senna, 1 tablet, BID PRN  zinc oxide-cod liver oil, , Daily PRN        Labs/Studies: Reviewed.    Radiology Results: Reviewed    Quality Indicators      Hearing, Speech, and Vision  Ability to Hear: Adequate  Ability to See in Adequate Light: Adequate  Expression of Ideas and Wants: Without difficulty  Understanding Verbal and Non-Verbal Content: Understands    Cognitive Pattern Assessment  Cognitive Pattern Assessment Used: BIMS  Brief Interview for Mental Status (BIMS)  Repetition of Three Words (First Attempt): 3  Temporal Orientation: Year: Correct  Temporal  Orientation: Month: Accurate within 5 days  Temporal Orientation: Day: Correct  Recall: Sock: Yes, no cue required  Recall: Blue: Yes, no cue required  Recall: Bed: Yes, no cue required  BIMS Summary Score: 15       Nutritional Approaches  Nutritional Approach: Feeding tube    ADLs  Admission Current   Eating Assistance Needed: Set-up / clean-up    CARE Score - 5 Assistance Needed: Set-up / clean-up    CARE Score - 5   Oral Hygiene  Assistance Needed: Set-up / clean-up    CARE Score - 5  Assistance Needed: Set-up / clean-up    CARE Score - 5   Bladder Continence       Bowel Continence       Toileting Hygiene Assistance Needed: Physical assistance Total assistance  CARE Score - 1 Assistance Needed: Physical assistance    CARE Score - 1    Toilet Transfer      CARE Score - 88      CARE Score - 88   Shower/Bathe Self      CARE Score - 88       CARE Score - 88   Upper Body Dressing Assistance Needed: Physical assistance 26%-50%  CARE Score - 3  Assistance Needed: Physical assistance    CARE Score - 2   Lower Body Dressing Assistance Needed: Physical assistance Total assistance  CARE Score - 1  Assistance Needed: Physical assistance    CARE Score - 1   On/Off Footwear Assistance Needed: Physical assistance Total assistance  CARE Score - 1  Assistance Needed: Physical assistance    CARE Score - 1         Transfers Admission Current   Bed to Chair Assistance Needed: Physical assistance Total assistance  CARE Score - 1  Assistance Needed: Physical assistance    CARE Score - 1     Lying to Sitting Assistance Needed: Physical assistance Total assistance  CARE Score - 1  Assistance Needed: Physical assistance    CARE Score - 1   Roll Left/Right Assistance Needed: Physical assistance 76% or more  CARE Score - 2  Assistance Needed: Physical assistance    CARE Score - 2   Sit to Lying Assistance Needed: Physical assistance Total assistance  CARE Score - 1  Assistance Needed: Physical assistance    CARE Score - 1   Sit to Stand Assistance Needed: Physical assistance Total assistance  CARE Score - 1  Assistance Needed: Physical assistance    CARE Score - 1         Mobility Admission Current   Walk 10 Feet Assistance Needed: Physical assistance Total assistance  CARE Score - 1 Assistance Needed: Physical assistance    CARE Score - 1     Walk 50 Feet 2 Turns      CARE Score - 88      CARE Score - 88   Walk 150 Feet      CARE Score - 88       CARE Score - 88   Walk 10 Feet Uneven      CARE Score - 88       CARE Score - 88   1 Step (Curb)      CARE Score - 88       CARE Score - 88   4 Steps      CARE Score - 88       CARE Score - 88  12 Steps      CARE Score - 88       CARE Score - 88   Picking Up Object      CARE Score - 88       CARE Score - 88   Wheelchair/Scooter Use        Wheel 50 Feet 2 Turns Assistance Needed: Physical assistance Total assistance    CARE Score - Wheel 50 Feet with Two Turns: 1    Type of Wheelchair/Scooter: Manual Assistance Needed: Physical assistance      CARE Score - Wheel 50 Feet with Two Turns: 1    Type of Wheelchair/Scooter: Manual   Wheel 150 Feet Assistance Needed: Physical assistance Total assistance    CARE Score - Wheel 150 Feet: 1    Type of Wheelchair/Scooter: Manual  Assistance Needed: Physical assistance      CARE Score - Wheel 150 Feet: 1    Type of Wheelchair/Scooter: Manual

## 2022-11-23 NOTE — Unmapped (Signed)
Slept well last night in the chair( Preferred). Had dilaudid IV this morning. Tube feeding ongoing. IV KVO still running. Safety maintained.

## 2022-11-23 NOTE — Unmapped (Signed)
Patient axox4,@ LPM O@ via nasal canula. Transfers with sara steady to wheelchair. NG tube remains to left nare currently not administering feed, but patent. Right internal jugular  continues to have one lumen kvo at 57ml/hr. Given prn pain meds x3 today, IV lasix also given. Patient states therapy was difficult because they tried to make her do stuff that she was unable to, even though she told them she could not or that it was a challenge. Takes meds whole in apple sauce. Continent B&B, had multiple bm's today. Family remains at bedside. No additional educational needs noted at this time.   Problem: Rehabilitation ([facility]) Plan of Care  Goal: Plan of Care Review  Outcome: Progressing  Flowsheets (Taken 11/23/2022 1547)  Progress: improving  Plan of Care Reviewed With: patient  Goal: Patient-Specific Goal (Individualized)  Outcome: Progressing  Goal: Absence of New-Onset Illness or Injury  Outcome: Progressing  Intervention: Prevent Fall and Fall Injury  Recent Flowsheet Documentation  Taken 11/23/2022 1400 by Olena Leatherwood, RN  Safety Interventions:   fall reduction program maintained   low bed   lighting adjusted for tasks/safety  Taken 11/23/2022 1200 by Olena Leatherwood, RN  Safety Interventions:   fall reduction program maintained   low bed   lighting adjusted for tasks/safety  Taken 11/23/2022 0800 by Olena Leatherwood, RN  Safety Interventions:   fall reduction program maintained   low bed   lighting adjusted for tasks/safety  Taken 11/23/2022 0714 by Olena Leatherwood, RN  Safety Interventions:   fall reduction program maintained   low bed   lighting adjusted for tasks/safety  Intervention: Prevent VTE (Venous Thromboembolism)  Recent Flowsheet Documentation  Taken 11/23/2022 0811 by Olena Leatherwood, RN  VTE Prevention/Management: (lovenox)   anticoagulant therapy   fluids promoted  Goal: Optimal Comfort and Wellbeing  Outcome: Progressing  Goal: Home and Community Transition Plan Established  Outcome: Progressing  Goal: Rounds/Family Conference  Outcome: Progressing     Problem: Self-Care Deficit  Goal: Improved Ability to Complete Activities of Daily Living  Outcome: Progressing     Problem: Skin Injury Risk Increased  Goal: Skin Health and Integrity  Outcome: Progressing  Intervention: Optimize Skin Protection  Recent Flowsheet Documentation  Taken 11/23/2022 1400 by Olena Leatherwood, RN  Pressure Reduction Techniques: frequent weight shift encouraged  Pressure Reduction Devices: positioning supports utilized  Taken 11/23/2022 1200 by Olena Leatherwood, RN  Pressure Reduction Techniques: frequent weight shift encouraged  Pressure Reduction Devices:   positioning supports utilized   chair cushion utilized  Taken 11/23/2022 0811 by Olena Leatherwood, RN  Pressure Reduction Techniques: frequent weight shift encouraged  Pressure Reduction Devices: positioning supports utilized  Taken 11/23/2022 0800 by Olena Leatherwood, RN  Pressure Reduction Techniques: frequent weight shift encouraged  Pressure Reduction Devices: positioning supports utilized  Taken 11/23/2022 0714 by Olena Leatherwood, RN  Pressure Reduction Techniques: frequent weight shift encouraged  Pressure Reduction Devices: positioning supports utilized     Problem: Pain Acute  Goal: Optimal Pain Control and Function  Outcome: Progressing     Problem: Fall Injury Risk  Goal: Absence of Fall and Fall-Related Injury  Outcome: Progressing  Intervention: Promote Scientist, clinical (histocompatibility and immunogenetics) Documentation  Taken 11/23/2022 1400 by Olena Leatherwood, RN  Safety Interventions:   fall reduction program maintained   low bed   lighting adjusted for tasks/safety  Taken 11/23/2022 1200 by Olena Leatherwood, RN  Safety Interventions:   fall reduction program maintained  low bed   lighting adjusted for tasks/safety  Taken 11/23/2022 0800 by Olena Leatherwood, RN  Safety Interventions:   fall reduction program maintained   low bed   lighting adjusted for tasks/safety  Taken 11/23/2022 0714 by Olena Leatherwood, RN  Safety Interventions:   fall reduction program maintained   low bed   lighting adjusted for tasks/safety

## 2022-11-24 DIAGNOSIS — D48119 Desmoid tumor: Principal | ICD-10-CM

## 2022-11-24 LAB — CBC
HEMATOCRIT: 29.3 % — ABNORMAL LOW (ref 34.0–44.0)
HEMOGLOBIN: 9.9 g/dL — ABNORMAL LOW (ref 11.3–14.9)
MEAN CORPUSCULAR HEMOGLOBIN CONC: 33.7 g/dL (ref 32.0–36.0)
MEAN CORPUSCULAR HEMOGLOBIN: 28 pg (ref 25.9–32.4)
MEAN CORPUSCULAR VOLUME: 83.3 fL (ref 77.6–95.7)
MEAN PLATELET VOLUME: 9.5 fL (ref 6.8–10.7)
PLATELET COUNT: 206 10*9/L (ref 150–450)
RED BLOOD CELL COUNT: 3.52 10*12/L — ABNORMAL LOW (ref 3.95–5.13)
RED CELL DISTRIBUTION WIDTH: 16.3 % — ABNORMAL HIGH (ref 12.2–15.2)
WBC ADJUSTED: 7.5 10*9/L (ref 3.6–11.2)

## 2022-11-24 LAB — COMPREHENSIVE METABOLIC PANEL
ALBUMIN: 2.9 g/dL — ABNORMAL LOW (ref 3.4–5.0)
ALKALINE PHOSPHATASE: 88 U/L (ref 46–116)
ALT (SGPT): 11 U/L (ref 10–49)
ANION GAP: 6 mmol/L (ref 5–14)
AST (SGOT): 19 U/L (ref <=34)
BILIRUBIN TOTAL: 0.2 mg/dL — ABNORMAL LOW (ref 0.3–1.2)
BLOOD UREA NITROGEN: 6 mg/dL — ABNORMAL LOW (ref 9–23)
BUN / CREAT RATIO: 13
CALCIUM: 9.1 mg/dL (ref 8.7–10.4)
CHLORIDE: 106 mmol/L (ref 98–107)
CO2: 30.5 mmol/L (ref 20.0–31.0)
CREATININE: 0.47 mg/dL — ABNORMAL LOW
EGFR CKD-EPI (2021) FEMALE: >90 mL/min/{1.73_m2} (ref >=60)
GLUCOSE RANDOM: 118 mg/dL (ref 70–179)
POTASSIUM: 3.6 mmol/L (ref 3.4–4.8)
PROTEIN TOTAL: 5.8 g/dL (ref 5.7–8.2)
SODIUM: 142 mmol/L (ref 135–145)

## 2022-11-24 MED ADMIN — diazePAM (VALIUM) tablet 2.5 mg: 2.5 mg | ORAL | @ 18:00:00

## 2022-11-24 MED ADMIN — promethazine (PHENERGAN) 6.5 mg in sodium chloride (NS) 0.9 % 50 mL IVPB: 6.5 mg | INTRAVENOUS | @ 14:00:00

## 2022-11-24 MED ADMIN — promethazine (PHENERGAN) 6.5 mg in sodium chloride (NS) 0.9 % 50 mL IVPB: 6.5 mg | INTRAVENOUS | @ 02:00:00

## 2022-11-24 MED ADMIN — diazePAM (VALIUM) tablet 5 mg: 5 mg | ORAL | @ 01:00:00

## 2022-11-24 MED ADMIN — nirogacestat (OGSIVEO) tablet 100 mg **Patient Supplied**: 100 mg | ORAL | @ 22:00:00 | Stop: 2022-11-26

## 2022-11-24 MED ADMIN — ondansetron (ZOFRAN) injection 4 mg: 4 mg | INTRAVENOUS | @ 11:00:00

## 2022-11-24 MED ADMIN — carboxymethylcellulose sodium (REFRESH CELLUVISC) 1 % ophthalmic gel 1 drop: 1 [drp] | OPHTHALMIC | @ 17:00:00

## 2022-11-24 MED ADMIN — carboxymethylcellulose sodium (REFRESH CELLUVISC) 1 % ophthalmic gel 1 drop: 1 [drp] | OPHTHALMIC | @ 13:00:00

## 2022-11-24 MED ADMIN — carboxymethylcellulose sodium (REFRESH CELLUVISC) 1 % ophthalmic gel 2 drop: 2 [drp] | OPHTHALMIC | @ 17:00:00

## 2022-11-24 MED ADMIN — buprenorphine-naloxone (SUBOXONE) 2-0.5 mg SL film 1 mg of buprenorphine: .5 | SUBLINGUAL | @ 18:00:00

## 2022-11-24 MED ADMIN — HYDROmorphone (PF) (DILAUDID) injection 0.5 mg: .5 mg | INTRAVENOUS | @ 11:00:00 | Stop: 2022-11-29

## 2022-11-24 MED ADMIN — carboxymethylcellulose sodium (REFRESH CELLUVISC) 1 % ophthalmic gel 1 drop: 1 [drp] | OPHTHALMIC | @ 01:00:00

## 2022-11-24 MED ADMIN — ondansetron (ZOFRAN) injection 4 mg: 4 mg | INTRAVENOUS

## 2022-11-24 MED ADMIN — famotidine (PEPCID) tablet 20 mg: 20 mg | ORAL | @ 01:00:00

## 2022-11-24 MED ADMIN — pantoprazole (Protonix) EC tablet 40 mg: 40 mg | ORAL | @ 13:00:00

## 2022-11-24 MED ADMIN — sodium chloride (NS) 0.9 % infusion: 10 mL/h | INTRAVENOUS | @ 04:00:00

## 2022-11-24 MED ADMIN — diazePAM (VALIUM) tablet 2.5 mg: 2.5 mg | ORAL | @ 13:00:00

## 2022-11-24 MED ADMIN — naloxegol (MOVANTIK) 12.5 mg tablet 12.5 mg: 12.5 mg | ORAL | @ 13:00:00

## 2022-11-24 MED ADMIN — pantoprazole (Protonix) EC tablet 40 mg: 40 mg | ORAL | @ 01:00:00

## 2022-11-24 MED ADMIN — diclofenac sodium (VOLTAREN) 1 % gel 2 g: 2 g | TOPICAL | @ 21:00:00

## 2022-11-24 MED ADMIN — multivitamin with folic acid 400 mcg tablet 1 tablet: 1 | ORAL | @ 14:00:00

## 2022-11-24 MED ADMIN — furosemide (LASIX) injection 10 mg: 10 mg | INTRAVENOUS | @ 17:00:00 | Stop: 2022-11-24

## 2022-11-24 MED ADMIN — carboxymethylcellulose sodium (REFRESH CELLUVISC) 1 % ophthalmic gel 1 drop: 1 [drp] | OPHTHALMIC | @ 21:00:00

## 2022-11-24 MED ADMIN — oxyCODONE (ROXICODONE) immediate release tablet 15 mg: 15 mg | ORAL | @ 13:00:00 | Stop: 2022-11-28

## 2022-11-24 MED ADMIN — gabapentin (NEURONTIN) capsule 100 mg: 100 mg | ORAL | @ 01:00:00

## 2022-11-24 MED ADMIN — lidocaine (ASPERCREME) 4 % 2 patch: 2 | TRANSDERMAL | @ 13:00:00

## 2022-11-24 MED ADMIN — nirogacestat (OGSIVEO) tablet 100 mg **Patient Supplied**: 100 mg | ORAL | @ 10:00:00 | Stop: 2022-11-26

## 2022-11-24 MED ADMIN — gabapentin (NEURONTIN) capsule 100 mg: 100 mg | ORAL | @ 13:00:00

## 2022-11-24 MED ADMIN — diclofenac sodium (VOLTAREN) 1 % gel 2 g: 2 g | TOPICAL | @ 13:00:00

## 2022-11-24 MED ADMIN — promethazine (PHENERGAN) 6.5 mg in sodium chloride (NS) 0.9 % 50 mL IVPB: 6.5 mg | INTRAVENOUS | @ 21:00:00

## 2022-11-24 MED ADMIN — oxyCODONE (ROXICODONE) immediate release tablet 15 mg: 15 mg | ORAL | @ 21:00:00 | Stop: 2022-11-28

## 2022-11-24 MED ADMIN — buprenorphine-naloxone (SUBOXONE) 2-0.5 mg SL film 1 mg of buprenorphine: .5 | SUBLINGUAL | @ 01:00:00

## 2022-11-24 MED ADMIN — HYDROmorphone (PF) (DILAUDID) injection 0.5 mg: .5 mg | INTRAVENOUS | Stop: 2022-11-29

## 2022-11-24 MED ADMIN — gabapentin (NEURONTIN) capsule 100 mg: 100 mg | ORAL | @ 18:00:00

## 2022-11-24 MED ADMIN — oxyCODONE (ROXICODONE) immediate release tablet 15 mg: 15 mg | ORAL | @ 02:00:00 | Stop: 2022-11-28

## 2022-11-24 MED ADMIN — buprenorphine-naloxone (SUBOXONE) 2-0.5 mg SL film 1 mg of buprenorphine: .5 | SUBLINGUAL | @ 13:00:00

## 2022-11-24 MED ADMIN — HYDROmorphone (PF) (DILAUDID) injection 0.5 mg: .5 mg | INTRAVENOUS | @ 17:00:00 | Stop: 2022-11-29

## 2022-11-24 NOTE — Unmapped (Signed)
[facility] Consult Services  ADVANCED FOOT AND NAIL CARE CONSULT     Reason For Consult:  - Initial  - Nail Care    Assessment:  Received order for [facility] to complete Advanced Foot and Nail Care Service.  Patient reports that she has not been able to independently cut toenails in awhile.    The patient has never been to a Podiatrist.  The Patient is agreeable to [facility] completing nail care.    Nail Assessment:  -  Toe Nail:  slightly long and thick  Patient concerned about nail thickness and discoloration of left great toe.  She is receiving chemotherapy at this time and is not sure if this is related to the treatment or if it is a fungal toenail.  The nail has fallen off once before and has grown like this now.     Procedure:  -  Patient in need of toenail cleaning and cutting only.  -  All debris removed from below nail surface with cuticle stick, nails cut with her personal clippers  -  Patient tolerated the procedure well    Instruments Used:  -  Patient preferred use of her own clippers.    Plan:  -  Instructed the Patient the risk of ulceration, infection, and trauma with overgrown nails. Reviewed the importance of following up with [facility], Podiatrist or PCP to continue to maintain nail care/length.    Workup Time:  30 minutes    Charissa Bash, RN, BSN, [employer], [facility]  Wound Ostomy Consult Service

## 2022-11-24 NOTE — Unmapped (Addendum)
Speech Language Pathology Clinical Swallow Assessment  Evaluation (11/24/22 1400)        Patient Name: [name]      Medical Record Number: [id number]   Date of Birth: September 24, 2000  Gender: Female             Post-Discharge Therapy Recommendations          Discharge Recommendations: Pt does not require family training, TBD         Barriers to Discharge: None     Assessment/Session Summary     SLP Treatment Diagnosis: Dysphagia     Assessment: Patient seen for clinical swallow evaluation in the setting of subjective c/o difficulty with PO intake. She presents with an intact oral phase, although oral preparatory time is prolonged given patient???s preference to fully chew her food prior to initiation of pharyngeal swallow. She demonstrated occasional delayed nonproductive cough across PO trials, c/o regurgitation, and c/o globus sensation with solids, all of which are concerning for ?esophageal involvement. Low concern for prandial aspiration, although risk remains for post-prandial aspiration in the setting of suspected reflux. Recommend continue regular consistency diet to maximize intake, meds in puree per patient preference. Aspiration and reflux precautions (of which patient is very aware) are advised, especially upright with intake and remain upright for 30-min following a meal. Offered possible follow-up Modified Barium Swallow Study for further evaluation; however, patient requested additional time to consider prior to making a decision. As such, SLP to follow-up at least once in ~1 week for further monitoring. All questions answered at this time.    Risk for Aspiration: Mild     Recommendations     Aspiration Risk  Follow-up Referral Recommendations : GI evaluation, Skilled SLP services to treat dysphagia and address aspiration risk factors  Recommendations: PO Diet, Consider alternative nutrition  Diet Liquids Recommendations: Thin Liquids, Level 0, No Restrictions  Diet Solids Recommendation: Regular Consistency Solids  Recommended Form of Medications: Whole, With puree (per pt preference)  Recommended Compensatory Techniques : Slow rate, Small sips/bites, Upright 90 degrees, HOB>30 degrees at all times, Monitor signs of aspiration, Alternate liquids and solids, Feed only when alert/awake, Endurance may be limited: give small meals and snacks, Upright 30 min after meal  Risk for Aspiration: Mild    Today's Interventions  Diet Solids Recommendation: Regular Consistency Solids  Diet Liquids Recommendations: Thin Liquids, Level 0, No Restrictions          Prognosis: Good     Positive Indicators: PLOF, age     Prior Level of Function                        Patient at end of session: All needs in reach, In chair, Nurse notified     Patient/Caregiver Reports: Eating is very effortful     Medical Tests / Procedures Comments: CXR 8/5: New enteric tube in the stomach. No other change.        Past Medical History:   Diagnosis Date    Abdominal pain     Acid reflux     occas    Anesthesia complication     itching, shaking, coldness; last few surgeries have gone much better    Cancer (CMS-HCC)     Cataract of right eye     COVID-19 virus infection 01/2019    Cyst of thyroid determined by ultrasound     monitoring    [name]     2 right forearm, 1  left thigh, 1 right scapula, 1 under left clavicle; multiple    Difficult intravenous access     [name] (familial adenomatous polyposis)     [name]     Gastric polyps     History of chemotherapy     last treatment approx 05/2019    History of colon polyps     History of COVID-19 01/2019    Iron deficiency anemia due to chronic blood loss     received iron infusion 11-2019    PONV (postoperative nausea and vomiting)     Rectal bleeding     Syncopal episodes     especially if becoming dehydrated      Social History     Tobacco Use    Smoking status: Never     Passive exposure: Past    Smokeless tobacco: Never   Substance Use Topics    Alcohol use: Never     Past Surgical History:   Procedure Laterality Date    COLON SURGERY      cyroablation      cystis removal      desmoid removal      PR CLOSE ENTEROSTOMY,RESEC+ANAST N/A 10/09/2020    Procedure: ILEOSTOMY TAKEDOWN;  Surgeon: Mickle Asper, MD;  Location: OR [place];  Service: General Surgery    PR COLONOSCOPY W/BIOPSY SINGLE/MULTIPLE N/A 10/27/2012    Procedure: COLONOSCOPY, FLEXIBLE, PROXIMAL TO SPLENIC FLEXURE; WITH BIOPSY, SINGLE OR MULTIPLE;  Surgeon: Shirlyn Goltz Mir, MD;  Location: PEDS PROCEDURE ROOM [place];  Service: Gastroenterology    PR COLONOSCOPY W/BIOPSY SINGLE/MULTIPLE N/A 09/14/2013    Procedure: COLONOSCOPY, FLEXIBLE, PROXIMAL TO SPLENIC FLEXURE; WITH BIOPSY, SINGLE OR MULTIPLE;  Surgeon: Shirlyn Goltz Mir, MD;  Location: PEDS PROCEDURE ROOM [place];  Service: Gastroenterology    PR COLONOSCOPY W/BIOPSY SINGLE/MULTIPLE N/A 11/08/2014    Procedure: COLONOSCOPY, FLEXIBLE, PROXIMAL TO SPLENIC FLEXURE; WITH BIOPSY, SINGLE OR MULTIPLE;  Surgeon: Arnold Long Mir, MD;  Location: PEDS PROCEDURE ROOM [facility];  Service: Gastroenterology    PR COLONOSCOPY W/BIOPSY SINGLE/MULTIPLE N/A 12/26/2015    Procedure: COLONOSCOPY, FLEXIBLE, PROXIMAL TO SPLENIC FLEXURE; WITH BIOPSY, SINGLE OR MULTIPLE;  Surgeon: Arnold Long Mir, MD;  Location: PEDS PROCEDURE ROOM [facility];  Service: Gastroenterology    PR COLONOSCOPY W/BIOPSY SINGLE/MULTIPLE N/A 09/02/2017    Procedure: COLONOSCOPY, FLEXIBLE, PROXIMAL TO SPLENIC FLEXURE; WITH BIOPSY, SINGLE OR MULTIPLE;  Surgeon: Arnold Long Mir, MD;  Location: PEDS PROCEDURE ROOM [place];  Service: Gastroenterology    PR COLSC FLX W/REMOVAL LESION BY HOT BX FORCEPS N/A 08/27/2016    Procedure: COLONOSCOPY, FLEXIBLE, PROXIMAL TO SPLENIC FLEXURE; W/REMOVAL TUMOR/POLYP/OTHER LESION, HOT BX FORCEP/CAUTE;  Surgeon: Arnold Long Mir, MD;  Location: PEDS PROCEDURE ROOM [facility];  Service: Gastroenterology    PR COLSC FLX W/RMVL OF TUMOR POLYP LESION SNARE TQ N/A 02/25/2019    Procedure: COLONOSCOPY FLEX; W/REMOV TUMOR/LES BY SNARE;  Surgeon: Helyn Numbers, MD;  Location: GI PROCEDURES [place] [facility];  Service: Gastroenterology    PR COLSC FLX W/RMVL OF TUMOR POLYP LESION SNARE TQ N/A 03/13/2020    Procedure: COLONOSCOPY FLEX; W/REMOV TUMOR/LES BY SNARE;  Surgeon: Helyn Numbers, MD;  Location: GI PROCEDURES [place] [facility];  Service: Gastroenterology    PR EXC SKIN BENIG 2.1-3 CM TRUNK,ARM,LEG Right 02/25/2017    Procedure: EXCISION, BENIGN LESION INCLUDE MARGINS, EXCEPT SKIN TAG, LEGS; EXCISED DIAMETER 2.1 TO 3.0 CM;  Surgeon: Clarene Duke, MD;  Location: CHILDRENS OR [facility];  Service: Plastics    PR EXC SKIN BENIG 3.1-4 CM TRUNK,ARM,LEG Right 02/25/2017  Procedure: EXCISION, BENIGN LESION INCLUDE MARGINS, EXCEPT SKIN TAG, ARMS; EXCISED DIAMETER 3.1 TO 4.0 CM;  Surgeon: Clarene Duke, MD;  Location: CHILDRENS OR [facility];  Service: Plastics    PR EXC SKIN BENIG >4 CM FACE,FACIAL Right 02/25/2017    Procedure: EXCISION, OTHER BENIGN LES INCLUD MARGIN, FACE/EARS/EYELIDS/NOSE/LIPS/MUCOUS MEMBRANE; EXCISED DIAM >4.0 CM;  Surgeon: Clarene Duke, MD;  Location: CHILDRENS OR [facility];  Service: Plastics    PR EXC TUMOR SOFT TISSUE LEG/ANKLE SUBQ 3+CM Right 08/05/2019    Procedure: EXCISION, TUMOR, SOFT TISSUE OF LEG OR ANKLE AREA, SUBCUTANEOUS; 3 CM OR GREATER;  Surgeon: Arsenio Katz, MD;  Location: MAIN OR [place];  Service: Plastics    PR EXC TUMOR SOFT TISSUE LEG/ANKLE SUBQ <3CM Right 08/05/2019    Procedure: EXCISION, TUMOR, SOFT TISSUE OF LEG OR ANKLE AREA, SUBCUTANEOUS; LESS THAN 3 CM;  Surgeon: Arsenio Katz, MD;  Location: MAIN OR [facility];  Service: Plastics    PR LAP, SURG PROCTECTOMY W J-POUCH N/A 08/10/2020    Procedure: ROBOTIC ASSISTED LAPAROSCOPIC PROCTOCOLECTOMY, ILEAL J POUCH, WITH OSTOMY;  Surgeon: Mickle Asper, MD;  Location: OR [place];  Service: General Surgery    PR NDSC EVAL INTSTINAL POUCH DX W/COLLJ SPEC SPX N/A 01/23/2021    Procedure: ENDO EVAL SM INTEST POUCH; DX; Surgeon: Modena Nunnery, MD;  Location: [facility] PROCEDURES [place] [facility];  Service: Gastroenterology    PR NDSC EVAL INTSTINAL POUCH DX W/COLLJ SPEC SPX N/A 08/27/2021    Procedure: ENDO EVAL SM INTEST POUCH; DX;  Surgeon: Hunt Oris, MD;  Location: [facility];  Service: Gastroenterology    PR NDSC EVAL INTSTINAL POUCH DX W/COLLJ SPEC SPX N/A 12/09/2021    Procedure: ENDO EVAL SM INTEST POUCH; DX;  Surgeon: Vidal Schwalbe, MD;  Location: GI PROC[facility]URES [facility];  Service: Gastroenterology    PR NDSC EVAL INTSTINAL POUCH DX W/COLLJ [facility] SPX Left 04/09/2022    Procedure: ENDO EVAL SM INTEST POUCH; DX;  Surgeon: Modena Nunnery, MD;  Location: [facility] PROCEDURES [place] [facility];  Service: Gastroenterology    PR NDSC EVAL INTSTINAL POUCH DX W/COLLJ SPEC SPX N/A 08/05/2022    Procedure: ENDO EVAL SM INTEST POUCH; DX;  Surgeon: Modena Nunnery, MD;  Location: GI PROCEDURES [facility];  Service: Gastroenterology    PR NDSC EVAL INTSTINAL POUCH W/BX SINGLE/MULTIPLE N/A 01/20/2022    Procedure: ENDOSCOPIC EVAL OF SMALL INTESTINAL POUCH; DIAGNOSTIC, No biopsies;  Surgeon: Andrey Farmer, MD;  Location: GI PROCEDURES [facility];  Service: Gastroenterology    PR NDSC EVAL INTSTINAL POUCH W/BX SINGLE/MULTIPLE N/A 02/13/2022    Procedure: ENDOSCOPIC EVAL OF SMALL INTESTINAL POUCH; DIAGNOSTIC, WITH BIOPSY;  Surgeon: Bronson Curb, MD;  Location: GI PROCEDURES [facility];  Service: Gastroenterology    PR UNLISTED PROCEDURE SMALL INTESTINE  01/23/2021    Procedure: UNLISTED PROCEDURE, SMALL INTESTINE;  Surgeon: Modena Nunnery, MD;  Location: GI PROCEDURES [place] [facility];  Service: Gastroenterology    PR UNLISTED PROCEDURE SMALL INTESTINE  02/13/2022    Procedure: UNLISTED PROCEDURE, SMALL INTESTINE;  Surgeon: Bronson Curb, MD;  Location: GI PROCEDURES [facility];  Service: Gastroenterology    PR UPPER GI ENDOSCOPY,BIOPSY N/A 10/27/2012 Procedure: UGI ENDOSCOPY; WITH BIOPSY, SINGLE OR MULTIPLE;  Surgeon: Shirlyn Goltz Mir, MD;  Location: PEDS PROCEDURE ROOM [facility];  Service: Gastroenterology    PR UPPER GI ENDOSCOPY,BIOPSY N/A 09/14/2013    Procedure: UGI ENDOSCOPY; WITH BIOPSY, SINGLE OR MULTIPLE;  Surgeon: Shirlyn Goltz Mir, MD;  Location:  PEDS PROCEDURE ROOM [facility];  Service: Gastroenterology    PR UPPER GI ENDOSCOPY,BIOPSY N/A 11/08/2014    Procedure: UGI ENDOSCOPY; WITH BIOPSY, SINGLE OR MULTIPLE;  Surgeon: Arnold Long Mir, MD;  Location: PEDS PROCEDURE ROOM [facility];  Service: Gastroenterology    PR UPPER GI ENDOSCOPY,BIOPSY N/A 12/26/2015    Procedure: UGI ENDOSCOPY; WITH BIOPSY, SINGLE OR MULTIPLE;  Surgeon: Arnold Long Mir, MD;  Location: PEDS PROCEDURE ROOM [facility];  Service: Gastroenterology    PR UPPER GI ENDOSCOPY,BIOPSY N/A 08/27/2016    Procedure: UGI ENDOSCOPY; WITH BIOPSY, SINGLE OR MULTIPLE;  Surgeon: Arnold Long Mir, MD;  Location: PEDS PROCEDURE ROOM [facility];  Service: Gastroenterology    PR UPPER GI ENDOSCOPY,BIOPSY N/A 09/02/2017    Procedure: UGI ENDOSCOPY; WITH BIOPSY, SINGLE OR MULTIPLE;  Surgeon: Arnold Long Mir, MD;  Location: PEDS PROCEDURE ROOM [facility];  Service: Gastroenterology    PR UPPER GI ENDOSCOPY,BIOPSY N/A 03/13/2020    Procedure: UGI ENDOSCOPY; WITH BIOPSY, SINGLE OR MULTIPLE;  Surgeon: Helyn Numbers, MD;  Location: GI PROCEDURES [place] [facility];  Service: Gastroenterology    PR UPPER GI ENDOSCOPY,BIOPSY N/A 09/05/2021    Procedure: UGI ENDOSCOPY; WITH BIOPSY, SINGLE OR MULTIPLE;  Surgeon: Wendall Papa, MD;  [facility]: [facility]URES [facility];  Service: Gastroenterology    PR UPPER GI ENDOSCOPY,DIAGNOSIS N/A 01/20/2022    Procedure: UGI ENDO, INCLUDE ESOPHAGUS, STOMACH, & DUODENUM &/OR JEJUNUM; DX W/WO COLLECTION SPECIMN, BY BRUSH OR WASH;  Surgeon: Andrey Farmer, MD;  Location: GI PROCEDURES [facility];  Service: Gastroenterology    TUMOR REMOVAL      multiple-head, neck, back, hand, right flank, multiple Family History   Problem Relation Age of Onset    No Known Problems Mother     No Known Problems Father     No Known Problems Sister     No Known Problems Brother     Stroke Maternal Grandmother     Other Maternal Grandmother         benign lesions of liver and pancreas, further details unknown    Cancer Maternal Grandmother     Diabetes Maternal Grandmother     Hypertension Maternal Grandmother     Thyroid disease Maternal Grandmother     Arthritis Maternal Grandfather     Asthma Maternal Grandfather     COPD Paternal Grandmother         Deceased    Miscarriages / Stillbirths Paternal Grandmother     Alcohol abuse Paternal Grandfather         Deceased    No Known Problems Maternal Aunt     No Known Problems Maternal Uncle     No Known Problems Paternal Aunt     No Known Problems Paternal Uncle     Anesthesia problems Neg Hx     Broken bones Neg Hx     Cancer Neg Hx     Clotting disorder Neg Hx     Collagen disease Neg Hx     Diabetes Neg Hx     Dislocations Neg Hx     Fibromyalgia Neg Hx     Gout Neg Hx     Hemophilia Neg Hx     Osteoporosis Neg Hx     Rheumatologic disease Neg Hx     Scoliosis Neg Hx     Severe sprains Neg Hx     Sickle cell anemia Neg Hx     Spinal Compression Fracture Neg Hx     Melanoma Neg Hx  Basal cell carcinoma Neg Hx     Squamous cell carcinoma Neg Hx         Adhesive tape-silicones; Ferrlecit [sodium ferric gluconat-sucrose]; Levofloxacin; Methylnaltrexone; Neomycin; Papaya; Morphine; Zosyn [piperacillin-tazobactam]; Compazine [prochlorperazine]; Iron analogues; Iron dextran; and Latex, natural rubber    Objective Findings     Activity Tolerance: Patient tolerated treatment well  Precautions: Falls precautions, Aspiration precautions   Precautions / Restrictions comments: OK to tolerate tachycardia with movement up to 180s - not a contraindication to therapy or getting into chairs as long as she is not symptomatic (faint, chest pain). However per patient report when she gets to 160s she is at risk for syncope and it is best not to push past this. Patient also reported when she takes a seated rest break and her HR does not get below 120s after 5 min it will likely increase quickly with continued mobility, also putting her at risk for syncope. Patient should also be encouraged to perform ROM/stretching exercises on all extremities to prevent development of contractures.   Weight Bearing Status: Non-applicable  Required Braces or Orthoses: RLE Knee brace, LLE Knee brace, LLE PRAFO, RLE PRAFO  Equipment/Environment: None     Communication Preference: Verbal     Pain: no s/s of pain       Temperature Spikes Noted: No  Respiratory Status : O2 via nasal cannula (2L [place])     History of Intubation: No          Behavior/Cognition: Alert, Cooperative, Pleasant mood     Self-Feeding Capacity: Functional for self-feeding     Positioning : Upright in chair      Oral/Motor Function     Volitional Swallow: Within Functional Limits     Labial ROM: Within Functional Limits  Labial Symmetry: Within Functional Limits  Labial Strength: Within Functional Limits    Lingual ROM: Within Functional Limits  Lingual Symmetry: Within Functional Limits  Lingual Strength: Within Functional Limits   Lingual Sensation: Within Functional Limits       Velum: elevation grossly WNL       Mandible: Within Functional Limits     Coordination: WFL     Facial ROM: Within Functional Limits  Facial Symmetry: Within Functional Limits  Facial Strength: Within Functional Limits   Facial Sensation: Within Functional Limits     Vocal Quality: Normal, Weak  Vocal Intensity: Mildly decreased     Apraxia: None present  Dysarthria: None present  Intelligibility: Intelligible  Breath Support: Adequate for speech  Dentition: Adequate        Comments: Thin liquids via straw sip/multiple sips, puree, puree w/meds, hard solid     Plan      GOALS:   Patient and Family Goal: Eat without difficulty     Patient Goals  Short Term Goal 1: Patient will participate in further clinical evaluation of swallowing to determine need for objective swallow study  Date Established : 11/24/22  Time Frame : 2 weeks  LTG 1: PT will tolerate a regular consistency diet without s/sx aspiration nor pulmonary compromise by d/c     Minutes per day: 30 for Days per week: 1-2        Planned Interventions: Dysphagia Intervention         Medical Staff Made Aware: Physician   SLP Individual [mins]: 45    The care for this patient was completed by Romilda Garret, SLP:  A student was present and participated in the care. Licensed/Credentialed therapist was physically present and  immediately available to direct and supervise tasks that were related to patient management. The direction and supervision was continuous throughout the time these tasks were performed.     I attest that I have reviewed the above information.  Signed: Romilda Garret, SLP  [facility] 11/24/2022

## 2022-11-24 NOTE — Unmapped (Signed)
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 Admitted to [facility] on 09/25/2022 for ileus, inability to tolerate PO now on supplemental tube feed, and diffuse pain due to muscle spasms of uncertain etiology.      Rehab Impairment Group Code [facility]): (Debility) 16 Debility (Non-Cardiac/Non-Pulmonary)  Etiology: Diffuse pain due to muscle spasms of uncertain etiology and cutaneous desmoid tumors    PLAN:     This patient is admitted to the Physical Medicine and Rehabilitation - Inpatient - C service from 8am-5pm on weekdays for questions regarding this patient. After hours, weekends, and holidays please contact the 1st Call resident pager     REHAB:   - PT and OT to maximize functional status with mobility and ADLs as well as prevention of joint contracture.   - Neuropsych for higher level cognitive evaluation and coping.  - RT for community re-integration, education, and leisure support services.  - Nutrition consult for diet information/teaching.   - To be discussed in weekly Interdisciplinary Team Conference.     Increased Tonicity  Dystonia   Started post-procedurally per patient ~09/25/22. Occurs intermittently throughout the day with a heightened occurrence during mobilization. At rest patient bilateral ankles in plantarflexion with worsening heel cord tightening. Has tonicity most prominent to HF and KE that is not velocity dependent. Has not noted significant improvement with increase of baclofen to 15 mg TID in setting of infection as below. Neurology previously evaluated patient seemingly ruling out an acute dystonic reaction, cord compression, central process, nutritional deficiency, stiff person syndrome and [name] after normal EMG on 8/22. Continues to be well controlled with valium, small improvement with baclofen PRN.  - Continue Baclofen 10 mg TID prn  - Continued Valium 2.5/2.5/5 mg TID and 2.5 mg nightly PRN  - PRAFO boots in bed     Headache  Currently managed with tylenol. She does have some posterior neck muscle tightness. Notes ketamine is helpful for headache pains. Describes photophobia and phonophobia with events. She does have loss of cervical lordosis on most recent MRI.   - Consider starting propranolol 5 mg BID for headache prophylaxis.   - Prn tylenol +/- prn fiorcet   - Lidocaine cream PRN to area of pain for cervicogenic headaches     Pain  Pain management consulted with recommendations as below. Patient will need to follow up with outpatient chronic pain provider ([name]).  - Tylenol 650 mg q6h PRN  - IV Dilaudid 0.5 mg q6h PRN; will wean as tolerated.  - Oxyocodone 10-15 mg q4h PRN  - Suboxone 2-0.5 mg TID  - Continue Gabapentin 100 mg TID, can increase to 300mg  TID as tolerated  - Voltaren gel QID, lidocaine patches  - Continue patellar taping with therapy or obtain patellar brace to help with pain with ambulation  - Continue Movantik 12.5mg  daily; discontinue upon discharge from AIR    Anasarca  Patient noted increased lower extremity, facial, and abdominal swelling prior to admission to AIR. Reported that pitting edema was present when taking Lyrica. No current medications with significant side effects of swelling. Requested IV Lasix as this was helpful in the past. Noted that some edema could be related to gabapentin, will plan to monitor and discontinue as indicated. On exam, patient with mild  edema in bilateral feet, not appreciated elsewhere on exam.   - Lasix 10mg  IV x1 9/16  - Daily standing weight  - Can consider oral Bumex   - Can consider discontinuing gabapentin as tolerated     Multifactorial Ileus   Nausea (chronic)  [employer] Emesis 2/2 NGT placement (resolved)  Contributors with history of narcotics, poor PO intake. Concern for malnutrition. Improvements to abdominal pain since weaning opioids. TPN has been discontinued with patient now tolerating tube feeds. Patient with chronic nausea that was previously managed with Zofran, though transitioned to phenergan due to prolonged Qtc. Upon admission to AIR, continued nightly tube feeds with Corpak NG tube in place, and PO intake as tolerated during the day. Will continue to decrease tube feeds with goal of discharge from AIR on full regular diet. If unable to tolerate full PO diet, will consider discharge home with corpak. Patient noted weight gain upon arrival to AIR (felt likely fluid retention) as weight was increased in a short period of time.   - Dietician following; appreciate care and recommendations  - Continue nightly tube feeds  - Continue PO regular diet + protein supplementation; anticipate NG tube removal prior to discharge   - Calorie count  - Daily standing weights   - Continue Protonix 40mg  BID  - Continue IV phenergan 6.5mg  Q6 PRN; will continue to monitor and consider scopolamine patch in hopes of transitioning off IV  [ ]  restart home cefdinir and doxycycline upon discharge from AIR for SIBP     Syncope  Multifactorial. Primary team ddx includes: vasovagal vs adverse effect of medications vs deconditioning vs iron deficiency. Last echo 04/2022. Stable upon admission to AIR.   - CTM     IDA  - Has allergy to formula iron      [name] - Desmoid Tumors  Follows [facility] (Dr. [name]) is patient's outpatient oncologist.   - Continue Nirogacestat 100mg  BID  - M/Th CMP; monitor LFT  [ ]  increase nirogacestat to 150mg  BID on 9/17 per oncology     Enterobacteriaceae group bacteremia (Resolved) - C albicans fungemia (Resolved)  Likely due to RIJ tunneled line placed 01/2022 now removed on 8/14.  8/13, 8/17 repeat blood cultures NGTD.  No evidence of vitreitis, retinitis 8/15 per Ophtho.  TTE 8/15 w possible vegetations on MV and TV or indwelling catheter; however, TEE 8/20 w/o vegetations. ID consulted during acute admission and now s/p treatment with cephalosporin and fluconazole on 8/31. Patient noted to have difficult IV access and kept R IJ access upon admission to AIR.   [ ]  Remove R internal jugular access when able to wean off IV meds.     Generalized anxiety disorder, depression, h/o panic attacks  Psychiatry consulted during acute hospitalization with recommendations as below.   - Consider decreasing mirtazapine 15 mg nightly to 7.5 mg nightly due to somnolence  - Valium as above, if needed could give 2.5mg  PRN for acute anxiety.     Daily Checklist  - Diet: Regular diet + nighttime tube feeds  - DVT PPX: SQ enoxaparin   - GI PPX: Protonix  - Access: R IJ non tunneled CVC. Necessary for IV medications; will remove when able.    DISPO: Patient to be discussed at weekly interdisciplinary team conference.   - EDD: 12/10/2022  - Follow-up: PCP, Chronic Pain, Oncology (Dr. [name]), Psych    SUBJECTIVE and PROGRESS WITH FUNCTIONAL ACTIVITIES:     Interval Events:   NAEON. Patient reports some  bilateral pitting edema in her feet (improved after 5mg  IV lasix x2 over the weekend). She would like to try another dose of Lasix and starting tomorrow decrease gabapentin (had swelling in the past with Lyrica). Obtaining daily weights and changed order to standing weights only for consistency as weight was likely increased as a bed weight.     Pt asked to not have therapy scheduled on 9/22 as it is her 22rd birthday.     OBJECTIVE:     Vital signs (last 24 hours):  Temp:  [36.6 ??C (97.9 ??F)-36.8 ??C (98.2 ??F)] 36.8 ??C (98.2 ??F)  Heart Rate:  [89-97] 89  Resp:  [18] 18  BP: (105-114)/(60) 105/60  MAP (mmHg):  [73] 73  SpO2:  [100 %] 100 %    Intake/Output (last 3 shifts):  I/O last 3 completed shifts:  In: 680 [P.O.:200; I.V.:30; NG/GT:450]  Out: 1550 [Urine:1550]    Physical Exam:   GEN: Sitting comfortably in recliner in no acute distress  EYES: sclera anicteric, conjunctiva clear   HEENT: corpak in place L nare. R I.J. in place (nontunneled)  CV: warm extremities, well perfused, no cyanosis, mild edema in bilateral feet, no edema in lower extremities  RESP:  No increased work of breathing on 2L [place]  ABD: Not distended  SKIN: No visible masses, lesions, rashes, ecchymoses.   MSK: Not visualized today, previously sacrum with some soft tissue swelling near lumbosacral junction.   NEURO: Alert, speech fluent  PSYCH: Mood euthymic, affect appropriate    Medications:  Scheduled    buprenorphine-naloxone (SUBOXONE) 2-0.5 mg SL film 1 mg of buprenorphine TID    carboxymethylcellulose sodium (REFRESH CELLUVISC) 1 % ophthalmic gel 1 drop QID    diazePAM (VALIUM) tablet 2.5 mg Daily    And    diazePAM (VALIUM) tablet 2.5 mg Daily    diazePAM (VALIUM) tablet 5 mg Nightly    diclofenac sodium (VOLTAREN) 1 % gel 2 g QID    enoxaparin (LOVENOX) syringe 40 mg Q24H SCH    famotidine (PEPCID) tablet 20 mg Nightly    gabapentin (NEURONTIN) capsule 100 mg TID    lidocaine (ASPERCREME) 4 % 2 patch Daily    mirtazapine (REMERON) tablet 15 mg Nightly    multivitamin with folic acid 400 mcg tablet 1 tablet Daily    naloxegol (MOVANTIK) 12.5 mg tablet 12.5 mg Daily    nirogacestat (OGSIVEO) tablet 100 mg **Patient Supplied** BID    pantoprazole (Protonix) EC tablet 40 mg BID     PRN acetaminophen, 650 mg, Q6H PRN  alum-mag-simeth, 60 mL, Q6H PRN  baclofen, 10 mg, TID PRN  bisacodyl, 10 mg, BID PRN  butalbital-acetaminophen-caffeine, 1 tablet, BID PRN  carboxymethylcellulose sodium, 2 drop, QID PRN  Chemo Clarification Order, , Continuous PRN  Chemo Clarification Order, , Continuous PRN  diazePAM, 2.5 mg, Nightly PRN  guaiFENesin, 200 mg, Q4H PRN  hydrocortisone, , BID PRN  HYDROmorphone, 0.5 mg, Q6H PRN  IP okay to treat, , Continuous PRN  lidocaine-diphenhydrAMINE-aluminum-magnesium, 10 mL, QID PRN  ondansetron, 4 mg, Q12H PRN  oxyCODONE, 10 mg, Q4H PRN   Or  oxyCODONE, 15 mg, Q4H PRN  phenol, 2 spray, Q2H PRN  polyethylene glycol, 17 g, BID PRN  promethazine, 6.5 mg, Q6H PRN  pseudoephedrine, 30 mg, Daily PRN  senna, 1 tablet, BID PRN  zinc oxide-cod liver oil, , Daily PRN        Labs/Studies: Reviewed.    Radiology Results: Reviewed    Quality Indicators  Hearing, Speech, and Vision  Ability to Hear: Adequate  Ability to See in Adequate Light: Adequate  Expression of Ideas and Wants: Without difficulty  Understanding Verbal and Non-Verbal Content: Understands    Cognitive Pattern Assessment  Cognitive Pattern Assessment Used: BIMS  Brief Interview for Mental Status (BIMS)  Repetition of Three Words (First Attempt): 3  Temporal Orientation: Year: Correct  Temporal Orientation: Month: Accurate within 5 days  Temporal Orientation: Day: Correct  Recall: Sock: Yes, no cue required  Recall: Blue: Yes, no cue required  Recall: Bed: Yes, no cue required  BIMS Summary Score: 15       Nutritional Approaches  Nutritional Approach: Feeding tube    ADLs  Admission Current   Eating Assistance Needed: Set-up / clean-up    CARE Score - 5 Assistance Needed: Set-up / clean-up    CARE Score - 5   Oral Hygiene  Assistance Needed: Set-up / clean-up    CARE Score - 5  Assistance Needed: Set-up / clean-up    CARE Score - 5   Bladder Continence       Bowel Continence       Toileting Hygiene Assistance Needed: Physical assistance Total assistance  CARE Score - 1 Assistance Needed: Physical assistance    CARE Score - 1    Toilet Transfer      CARE Score - 88      CARE Score - 88   Shower/Bathe Self      CARE Score - 88       CARE Score - 88   Upper Body Dressing Assistance Needed: Physical assistance 26%-50%  CARE Score - 3  Assistance Needed: Physical assistance    CARE Score - 2   Lower Body Dressing Assistance Needed: Physical assistance Total assistance  CARE Score - 1  Assistance Needed: Physical assistance    CARE Score - 1   On/Off Footwear Assistance Needed: Physical assistance Total assistance  CARE Score - 1 Assistance Needed: Physical assistance    CARE Score - 1         Transfers Admission Current   Bed to Chair Assistance Needed: Physical assistance Total assistance  CARE Score - 1  Assistance Needed: Physical assistance    CARE Score - 1     Lying to Sitting Assistance Needed: Physical assistance Total assistance  CARE Score - 1  Assistance Needed: Physical assistance    CARE Score - 1   Roll Left/Right Assistance Needed: Physical assistance 76% or more  CARE Score - 2  Assistance Needed: Physical assistance    CARE Score - 2   Sit to Lying Assistance Needed: Physical assistance Total assistance  CARE Score - 1  Assistance Needed: Physical assistance    CARE Score - 1   Sit to Stand Assistance Needed: Physical assistance Total assistance  CARE Score - 1  Assistance Needed: Physical assistance    CARE Score - 1         Mobility Admission Current   Walk 10 Feet Assistance Needed: Physical assistance Total assistance  CARE Score - 1 Assistance Needed: Physical assistance    CARE Score - 1     Walk 50 Feet 2 Turns      CARE Score - 88      CARE Score - 88   Walk 150 Feet      CARE Score - 88       CARE Score - 88   Walk 10 Feet Uneven  CARE Score - 88       CARE Score - 88   1 Step (Curb)      CARE Score - 88       CARE Score - 88   4 Steps      CARE Score - 88       CARE Score - 88   12 Steps      CARE Score - 88       CARE Score - 88   Picking Up Object      CARE Score - 88       CARE Score - 88   Wheelchair/Scooter Use        Wheel 50 Feet 2 Turns Assistance Needed: Physical assistance Total assistance    CARE Score - Wheel 50 Feet with Two Turns: 1    Type of Wheelchair/Scooter: Manual Assistance Needed: Physical assistance      CARE Score - Wheel 50 Feet with Two Turns: 1    Type of Wheelchair/Scooter: Manual   Wheel 150 Feet Assistance Needed: Physical assistance Total assistance    CARE Score - Wheel 150 Feet: 1    Type of Wheelchair/Scooter: Manual  Assistance Needed: Physical assistance      CARE Score - Wheel 150 Feet: 1    Type of Wheelchair/Scooter: Manual

## 2022-11-24 NOTE — Unmapped (Signed)
Patient axox4, takes meds whole in apple sauce. Gets emotional about weight despite education on fluid weight gain, encouraging words. Continent B&B. Transfers with sara steady to wheelchair and does not help with pulling herself up. NG tube remains to left nare and intact and patent. Triple lumen internal jugular intact with proximal line Kvo, no blood return.   Problem: Rehabilitation ([facility]) Plan of Care  Goal: Plan of Care Review  Outcome: Ongoing - Unchanged  Flowsheets (Taken 11/24/2022 1139)  Progress: no change  Plan of Care Reviewed With: patient  Goal: Patient-Specific Goal (Individualized)  Outcome: Ongoing - Unchanged  Goal: Absence of New-Onset Illness or Injury  Outcome: Ongoing - Unchanged  Intervention: Prevent Fall and Fall Injury  Recent Flowsheet Documentation  Taken 11/24/2022 1000 by Olena Leatherwood, RN  Safety Interventions:   fall reduction program maintained   low bed   lighting adjusted for tasks/safety  Taken 11/24/2022 0717 by Olena Leatherwood, RN  Safety Interventions:   fall reduction program maintained   low bed   lighting adjusted for tasks/safety  Intervention: Prevent VTE (Venous Thromboembolism)  Recent Flowsheet Documentation  Taken 11/24/2022 0926 by Olena Leatherwood, RN  VTE Prevention/Management:   anticoagulant therapy   fluids promoted  Goal: Optimal Comfort and Wellbeing  Outcome: Ongoing - Unchanged  Goal: Home and Community Transition Plan Established  Outcome: Ongoing - Unchanged  Goal: Rounds/Family Conference  Outcome: Ongoing - Unchanged     Problem: Self-Care Deficit  Goal: Improved Ability to Complete Activities of Daily Living  Outcome: Ongoing - Unchanged     Problem: Skin Injury Risk Increased  Goal: Skin Health and Integrity  Outcome: Ongoing - Unchanged  Intervention: Optimize Skin Protection  Recent Flowsheet Documentation  Taken 11/24/2022 1000 by Olena Leatherwood, RN  Pressure Reduction Techniques: frequent weight shift encouraged  Pressure Reduction Devices: positioning supports utilized  Taken 11/24/2022 0926 by Olena Leatherwood, RN  Pressure Reduction Techniques: frequent weight shift encouraged  Pressure Reduction Devices: positioning supports utilized  Taken 11/24/2022 0717 by Olena Leatherwood, RN  Pressure Reduction Techniques: frequent weight shift encouraged  Pressure Reduction Devices: positioning supports utilized     Problem: Pain Acute  Goal: Optimal Pain Control and Function  Outcome: Ongoing - Unchanged     Problem: Fall Injury Risk  Goal: Absence of Fall and Fall-Related Injury  Outcome: Ongoing - Unchanged  Intervention: Promote Injury-Free Environment  Recent Flowsheet Documentation  Taken 11/24/2022 1000 by Olena Leatherwood, RN  Safety Interventions:   fall reduction program maintained   low bed   lighting adjusted for tasks/safety  Taken 11/24/2022 0717 by Olena Leatherwood, RN  Safety Interventions:   fall reduction program maintained   low bed   lighting adjusted for tasks/safety

## 2022-11-24 NOTE — Unmapped (Signed)
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 Admitted to [facility] on 09/25/2022 for ileus, inability to tolerate PO now on supplemental tube feed, and diffuse pain due to muscle spasms of uncertain etiology.   Rehab Impairment Group Code [facility]): (Debility) 16 Debility (Non-Cardiac/Non-Pulmonary)  Etiology: Diffuse pain due to muscle spasms of uncertain etiology and cutaneous desmoid tumors    Consultation with rehabilitation counseling was requested to assess emotional adjustment and coping, barriers to rehabilitation and provide supportive counseling.     [name] [name] provided an overview of her health problems, focusing on the past couple of years more, which were particularly difficult. She did not anticipate needing to stay in the hospital so long with this last admission and is now also faced with spending her birthday too in the hospital (she already spent holidays and important life events in the hospital before, but never a birthday).     Coping has been more challenging at times and the transitions difficult (bad news from her providers, and extension of hospital stay, a difficult procedure ahead, being away from home and family; the need to suspend her activities and schooling was particularly painful). She is trying to stay focused, be brave, strong to be able to carry the heavy burden of her complex health problems, but feels hopeless at times, ???trapped in her body???, misunderstood and avoided by some of her friends, which cause emotional pain and challenges. She lost many friends, she feels she lost years from her youth, and her entire life is so different from what she hoped it would be years ago. [name] [name] tries to take one day at a time, set small goals, and work hard to achieve them and stay focused on activities that promote her adjustment and rehabilitation. [name] [name] is determined to work hard, to recondition her body, and acquire all the mobility and independent living skills she can to be able to return home.     She is receiving medication for her mental health concerns, and for the past 2 years she has been seeing a psychologist, which was critical for her adjustment and mental well-being. Thanks to psychotherapy, she feels better equipped to face her illnesses, process emotional experiences. She feels therapy helped her become a stronger, better version of herself. 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 [name] [name] appreciated the validation, encouragement, support and positive feedback about her coping abilities. RC recommended mental disengagement and distraction to stop rumination and worry. Positive reinterpretation and growth from this experience has been significant.     50 min face-to-face with a patient. RC will follow. No charge.

## 2022-11-24 NOTE — Unmapped (Signed)
Patient is alert and oriented x 4. Right IJ central line patent with good blood return on 2 lumens. NS continued at 40ml/hr. Patient complains itchiness on the dressing area; dressing changed done. Noted slight redness at the insertion site.Patient Continues to complain moderate to severe back pain and nausea. Heating pad in use. Tolerates tube feeding and regular diet; meds given with apple sauce. Corpak on the left nare intact. Out of bed assist x 3 with Corene Cornea.Continent on bowel and bladder;Last BM: 9/15. Skin injury prevention;aspiration and fall precautions maintained.  Will continue to monitor.    Problem: Rehabilitation (IRF) Plan of Care  Goal: Plan of Care Review  Outcome: Progressing  Goal: Patient-Specific Goal (Individualized)  Outcome: Progressing  Goal: Absence of New-Onset Illness or Injury  Outcome: Progressing  Intervention: Prevent Fall and Fall Injury  Recent Flowsheet Documentation  Taken 11/24/2022 0200 by Loann Quill, RN  Safety Interventions:   fall reduction program maintained   family at bedside  Taken 11/24/2022 0000 by Loann Quill, RN  Safety Interventions:   fall reduction program maintained   family at bedside  Taken 11/23/2022 2200 by Loann Quill, RN  Safety Interventions:   fall reduction program maintained   family at bedside  Taken 11/23/2022 2000 by Loann Quill, RN  Safety Interventions:   fall reduction program maintained   family at bedside  Taken 11/23/2022 1917 by Loann Quill, RN  Safety Interventions: family at bedside  Intervention: Prevent Skin Injury  Recent Flowsheet Documentation  Taken 11/24/2022 0200 by Loann Quill, RN  Device Skin Pressure Protection: absorbent pad utilized/changed  Skin Protection: incontinence pads utilized  Taken 11/24/2022 0000 by Loann Quill, RN  Device Skin Pressure Protection: absorbent pad utilized/changed  Skin Protection: incontinence pads utilized  Taken 11/23/2022 2200 by Loann Quill, RN  Device Skin Pressure Protection: absorbent pad utilized/changed  Skin Protection: incontinence pads utilized  Taken 11/23/2022 2000 by Loann Quill, RN  Device Skin Pressure Protection: absorbent pad utilized/changed  Skin Protection: incontinence pads utilized  Intervention: Prevent Infection  Recent Flowsheet Documentation  Taken 11/23/2022 2000 by Loann Quill, RN  Infection Prevention:   environmental surveillance performed   hand hygiene promoted   equipment surfaces disinfected   rest/sleep promoted  Intervention: Prevent VTE (Venous Thromboembolism)  Recent Flowsheet Documentation  Taken 11/23/2022 2330 by Loann Quill, RN  Anti-Embolism Device Type: SCD, Knee  Anti-Embolism Intervention: On  Anti-Embolism Device Location: BLE  Taken 11/23/2022 2200 by Joaquim Tolen Karlyn Agee, RN  VTE Prevention/Management:   anticoagulant therapy   bleeding precautions maintained   bleeding risk factors identified  Goal: Optimal Comfort and Wellbeing  Outcome: Progressing  Goal: Home and Community Transition Plan Established  Outcome: Progressing  Goal: Rounds/Family Conference  Outcome: Progressing     Problem: Self-Care Deficit  Goal: Improved Ability to Complete Activities of Daily Living  Outcome: Progressing  Intervention: Promote Activity and Functional Independence  Recent Flowsheet Documentation  Taken 11/23/2022 2200 by Loann Quill, RN  Self-Care Promotion:   independence encouraged   BADL personal objects within reach   BADL personal routines maintained   meal set-up provided   adaptive equipment use encouraged     Problem: Skin Injury Risk Increased  Goal: Skin Health and Integrity  Outcome: Progressing  Intervention: Optimize Skin Protection  Recent Flowsheet Documentation  Taken 11/24/2022 0200 by Kordae Buonocore Karlyn Agee, RN  Pressure Reduction Techniques: frequent weight shift encouraged  Pressure Reduction Devices:  positioning supports utilized  Skin Protection: incontinence pads utilized  Taken 11/24/2022 0000 by Loann Quill, RN  Pressure Reduction Techniques:   heels elevated off bed   weight shift assistance provided  Pressure Reduction Devices: positioning supports utilized  Skin Protection: incontinence pads utilized  Taken 11/23/2022 2200 by Amiaya Mcneeley Karlyn Agee, RN  Pressure Reduction Techniques: frequent weight shift encouraged  Pressure Reduction Devices: positioning supports utilized  Skin Protection: incontinence pads utilized  Taken 11/23/2022 2000 by Madalyn Rob, Dewan Emond Karlyn Agee, RN  Pressure Reduction Techniques: frequent weight shift encouraged  Pressure Reduction Devices: positioning supports utilized  Skin Protection: incontinence pads utilized     Problem: Pain Acute  Goal: Optimal Pain Control and Function  Outcome: Progressing     Problem: Fall Injury Risk  Goal: Absence of Fall and Fall-Related Injury  Outcome: Progressing  Intervention: Identify and Manage Contributors  Recent Flowsheet Documentation  Taken 11/23/2022 2200 by Loann Quill, RN  Self-Care Promotion:   independence encouraged   BADL personal objects within reach   BADL personal routines maintained   meal set-up provided   adaptive equipment use encouraged  Intervention: Promote Injury-Free Environment  Recent Flowsheet Documentation  Taken 11/24/2022 0200 by Loann Quill, RN  Safety Interventions:   fall reduction program maintained   family at bedside  Taken 11/24/2022 0000 by Loann Quill, RN  Safety Interventions:   fall reduction program maintained   family at bedside  Taken 11/23/2022 2200 by Loann Quill, RN  Safety Interventions:   fall reduction program maintained   family at bedside  Taken 11/23/2022 2000 by Loann Quill, RN  Safety Interventions:   fall reduction program maintained   family at bedside

## 2022-11-25 MED ORDER — NIROGACESTAT 150 MG TABLET
ORAL_TABLET | Freq: Two times a day (BID) | ORAL | 5 refills | 30 days | Status: CP
Start: 2022-11-25 — End: 2022-11-20
  Filled 2022-12-14: qty 90, 15d supply, fill #1

## 2022-11-25 MED ADMIN — multivitamin with folic acid 400 mcg tablet 1 tablet: 1 | ORAL | @ 13:00:00

## 2022-11-25 MED ADMIN — buprenorphine-naloxone (SUBOXONE) 2-0.5 mg SL film 1 mg of buprenorphine: .5 | SUBLINGUAL | @ 13:00:00

## 2022-11-25 MED ADMIN — nirogacestat (OGSIVEO) tablet 100 mg **Patient Supplied**: 100 mg | ORAL | @ 10:00:00 | Stop: 2022-11-25

## 2022-11-25 MED ADMIN — gabapentin (NEURONTIN) capsule 100 mg: 100 mg | ORAL | @ 01:00:00

## 2022-11-25 MED ADMIN — promethazine (PHENERGAN) 6.5 mg in sodium chloride (NS) 0.9 % 50 mL IVPB: 6.5 mg | INTRAVENOUS | @ 16:00:00

## 2022-11-25 MED ADMIN — diazePAM (VALIUM) tablet 5 mg: 5 mg | ORAL | @ 01:00:00

## 2022-11-25 MED ADMIN — oxyCODONE (ROXICODONE) immediate release tablet 15 mg: 15 mg | ORAL | @ 21:00:00 | Stop: 2022-11-28

## 2022-11-25 MED ADMIN — oxyCODONE (ROXICODONE) immediate release tablet 15 mg: 15 mg | ORAL | @ 13:00:00 | Stop: 2022-11-28

## 2022-11-25 MED ADMIN — buprenorphine-naloxone (SUBOXONE) 2-0.5 mg SL film 1 mg of buprenorphine: .5 | SUBLINGUAL | @ 21:00:00

## 2022-11-25 MED ADMIN — HYDROmorphone (PF) (DILAUDID) injection 0.5 mg: .5 mg | INTRAVENOUS | @ 10:00:00 | Stop: 2022-11-29

## 2022-11-25 MED ADMIN — diazePAM (VALIUM) tablet 2.5 mg: 2.5 mg | ORAL | @ 21:00:00

## 2022-11-25 MED ADMIN — naloxegol (MOVANTIK) 12.5 mg tablet 12.5 mg: 12.5 mg | ORAL | @ 13:00:00

## 2022-11-25 MED ADMIN — furosemide (LASIX) injection 10 mg: 10 mg | INTRAVENOUS | @ 16:00:00 | Stop: 2022-11-25

## 2022-11-25 MED ADMIN — HYDROmorphone (PF) (DILAUDID) injection 0.5 mg: .5 mg | INTRAVENOUS | @ 16:00:00 | Stop: 2022-11-29

## 2022-11-25 MED ADMIN — pantoprazole (Protonix) EC tablet 40 mg: 40 mg | ORAL | @ 01:00:00

## 2022-11-25 MED ADMIN — HYDROmorphone (PF) (DILAUDID) injection 0.5 mg: .5 mg | INTRAVENOUS | @ 23:00:00 | Stop: 2022-11-29

## 2022-11-25 MED ADMIN — gabapentin (NEURONTIN) capsule 100 mg: 100 mg | ORAL | @ 13:00:00 | Stop: 2022-11-25

## 2022-11-25 MED ADMIN — carboxymethylcellulose sodium (REFRESH CELLUVISC) 1 % ophthalmic gel 1 drop: 1 [drp] | OPHTHALMIC | @ 01:00:00

## 2022-11-25 MED ADMIN — oxyCODONE (ROXICODONE) immediate release tablet 15 mg: 15 mg | ORAL | @ 03:00:00 | Stop: 2022-11-28

## 2022-11-25 MED ADMIN — diazePAM (VALIUM) tablet 2.5 mg: 2.5 mg | ORAL | @ 13:00:00

## 2022-11-25 MED ADMIN — pantoprazole (Protonix) EC tablet 40 mg: 40 mg | ORAL | @ 13:00:00

## 2022-11-25 MED ADMIN — famotidine (PEPCID) tablet 20 mg: 20 mg | ORAL | @ 01:00:00

## 2022-11-25 MED ADMIN — buprenorphine-naloxone (SUBOXONE) 2-0.5 mg SL film 1 mg of buprenorphine: .5 | SUBLINGUAL | @ 01:00:00

## 2022-11-25 MED ADMIN — ondansetron (ZOFRAN) injection 4 mg: 4 mg | INTRAVENOUS | @ 13:00:00

## 2022-11-25 MED ADMIN — nirogacestat (OGSIVEO) tablet 100 mg **Patient Supplied**: 100 mg | ORAL | @ 22:00:00 | Stop: 2022-11-25

## 2022-11-25 MED FILL — COURIERED MED OR SUPPLY: 1 days supply | Qty: 1 | Fill #0

## 2022-11-25 NOTE — Unmapped (Signed)
Patient is alert and oriented x 4.Mother at bedside.Right IJ central line patent with good blood return on 2 lumen;dressing clean , dry and intact. NS continued at 24ml/hr; Oxygen inhalation at 2 LPM.Patient  continues to complain moderate to severe back, shoulder and leg pain, and nausea. Tolerates new tube feeding at this time at 50 ml/hr ( goal rate : 64ml/hr); meds given with apple sauce. Out of bed assist x 3 with Corene Cornea.Continent on bowel and bladder;Last BM: 9/17. Skin injury prevention;aspiration and fall precautions maintained.  Will continue to monitor.      Problem: Rehabilitation (IRF) Plan of Care  Goal: Plan of Care Review  Outcome: Progressing  Goal: Patient-Specific Goal (Individualized)  Outcome: Progressing  Goal: Absence of New-Onset Illness or Injury  Outcome: Progressing  Intervention: Prevent Fall and Fall Injury  Recent Flowsheet Documentation  Taken 11/25/2022 0400 by Loann Quill, RN  Safety Interventions:   fall reduction program maintained   family at bedside  Taken 11/25/2022 0000 by Loann Quill, RN  Safety Interventions:   fall reduction program maintained   family at bedside  Taken 11/24/2022 2200 by Loann Quill, RN  Safety Interventions:   fall reduction program maintained   family at bedside  Taken 11/24/2022 2000 by Loann Quill, RN  Safety Interventions:   fall reduction program maintained   family at bedside  Taken 11/24/2022 1920 by Loann Quill, RN  Safety Interventions:   fall reduction program maintained   family at bedside  Intervention: Prevent Skin Injury  Recent Flowsheet Documentation  Taken 11/25/2022 0400 by Loann Quill, RN  Device Skin Pressure Protection: absorbent pad utilized/changed  Skin Protection: incontinence pads utilized  Taken 11/25/2022 0000 by Loann Quill, RN  Device Skin Pressure Protection: absorbent pad utilized/changed  Skin Protection: incontinence pads utilized  Taken 11/24/2022 2200 by Loann Quill, RN  Device Skin Pressure Protection: absorbent pad utilized/changed  Skin Protection: incontinence pads utilized  Taken 11/24/2022 2000 by Loann Quill, RN  Device Skin Pressure Protection: absorbent pad utilized/changed  Skin Protection: incontinence pads utilized  Intervention: Prevent Infection  Recent Flowsheet Documentation  Taken 11/24/2022 2000 by Loann Quill, RN  Infection Prevention:   environmental surveillance performed   hand hygiene promoted   equipment surfaces disinfected   rest/sleep promoted  Intervention: Prevent VTE (Venous Thromboembolism)  Recent Flowsheet Documentation  Taken 11/25/2022 0028 by Loann Quill, RN  Anti-Embolism Device Type: TED, Knee High  Anti-Embolism Intervention: On  Anti-Embolism Device Location: BLE  Taken 11/24/2022 2045 by Loann Quill, RN  VTE Prevention/Management:   bleeding risk factors identified   bleeding precautions maintained  Goal: Optimal Comfort and Wellbeing  Outcome: Progressing  Goal: Home and Community Transition Plan Established  Outcome: Progressing  Goal: Rounds/Family Conference  Outcome: Progressing     Problem: Self-Care Deficit  Goal: Improved Ability to Complete Activities of Daily Living  Outcome: Progressing     Problem: Skin Injury Risk Increased  Goal: Skin Health and Integrity  Outcome: Progressing  Intervention: Optimize Skin Protection  Recent Flowsheet Documentation  Taken 11/25/2022 0400 by Loann Quill, RN  Pressure Reduction Techniques: frequent weight shift encouraged  Pressure Reduction Devices: positioning supports utilized  Skin Protection: incontinence pads utilized  Taken 11/25/2022 0000 by Loann Quill, RN  Pressure Reduction Techniques: frequent weight shift encouraged  Pressure Reduction Devices: positioning supports utilized  Skin Protection: incontinence pads utilized  Taken 11/24/2022 2200 by Madalyn Rob, Humberto Addo Karlyn Agee,  RN  Pressure Reduction Techniques: frequent weight shift encouraged  Pressure Reduction Devices: positioning supports utilized  Skin Protection: incontinence pads utilized  Taken 11/24/2022 2000 by Madalyn Rob, Nelli Swalley Karlyn Agee, RN  Pressure Reduction Techniques: frequent weight shift encouraged  Pressure Reduction Devices: positioning supports utilized  Skin Protection: incontinence pads utilized     Problem: Pain Acute  Goal: Optimal Pain Control and Function  Outcome: Progressing     Problem: Fall Injury Risk  Goal: Absence of Fall and Fall-Related Injury  Outcome: Progressing  Intervention: Promote Scientist, clinical (histocompatibility and immunogenetics) Documentation  Taken 11/25/2022 0400 by Loann Quill, RN  Safety Interventions:   fall reduction program maintained   family at bedside  Taken 11/25/2022 0000 by Loann Quill, RN  Safety Interventions:   fall reduction program maintained   family at bedside  Taken 11/24/2022 2200 by Loann Quill, RN  Safety Interventions:   fall reduction program maintained   family at bedside  Taken 11/24/2022 2000 by Loann Quill, RN  Safety Interventions:   fall reduction program maintained   family at bedside  Taken 11/24/2022 1920 by Loann Quill, RN  Safety Interventions:   fall reduction program maintained   family at bedside

## 2022-11-25 NOTE — Unmapped (Signed)
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 Admitted to [facility] on 09/25/2022 for ileus, inability to tolerate PO now on supplemental tube feed, and diffuse pain due to muscle spasms of uncertain etiology.      Rehab Impairment Group Code [facility]): (Debility) 16 Debility (Non-Cardiac/Non-Pulmonary)  Etiology: Diffuse pain due to muscle spasms of uncertain etiology and cutaneous desmoid tumors    PLAN:     This patient is admitted to the Physical Medicine and Rehabilitation - Inpatient - C service from 8am-5pm on weekdays for questions regarding this patient. After hours, weekends, and holidays please contact the 1st Call resident pager     REHAB:   - PT and OT to maximize functional status with mobility and ADLs as well as prevention of joint contracture.   - Neuropsych for higher level cognitive evaluation and coping.  - RT for community re-integration, education, and leisure support services.  - Nutrition consult for diet information/teaching.   - To be discussed in weekly Interdisciplinary Team Conference.     Increased Tonicity  Dystonia   Started post-procedurally per patient ~09/25/22. Occurs intermittently throughout the day with a heightened occurrence during mobilization. At rest patient bilateral ankles in plantarflexion with worsening heel cord tightening. Has tonicity most prominent to HF and KE that is not velocity dependent. Has not noted significant improvement with increase of baclofen to 15 mg TID in setting of infection as below. Neurology previously evaluated patient seemingly ruling out an acute dystonic reaction, cord compression, central process, nutritional deficiency, stiff person syndrome and [name] after normal EMG on 8/22. Continues to be well controlled with valium, small improvement with baclofen PRN.  - Continue Baclofen 10 mg TID prn  - Continued Valium 2.5/2.5/5 mg TID and 2.5 mg nightly PRN  - PRAFO boots in bed     Headache  Currently managed with tylenol. She does have some posterior neck muscle tightness. Notes ketamine is helpful for headache pains. Describes photophobia and phonophobia with events. She does have loss of cervical lordosis on most recent MRI.   - Consider starting propranolol 5 mg BID for headache prophylaxis.   - Prn tylenol +/- prn fiorcet   - Lidocaine cream PRN to area of pain for cervicogenic headaches     Pain  Pain management consulted with recommendations as below. Patient will need to follow up with outpatient chronic pain provider ([name]).  - Tylenol 650 mg q6h PRN  - IV Dilaudid 0.5 mg q6h PRN; will wean as tolerated.  - Oxyocodone 10-15 mg q4h PRN  - Suboxone 2-0.5 mg TID  - STOP Gabapentin 100 mg TID, 9/17 per patient preference (see below with anasarca related to Lyrica)  - Voltaren gel QID, lidocaine patches  - Continue patellar taping with therapy or obtain patellar brace to help with pain with ambulation  - Continue Movantik 12.5mg  daily; discontinue upon discharge from [facility]    Anasarca  Patient noted increased lower extremity, facial, and abdominal swelling prior to admission to [facility]. Reported that pitting edema was present when taking Lyrica. No current medications with significant side effects of swelling. Requested IV Lasix as this was helpful in the past. Noted that some edema could be related to gabapentin, will plan to monitor and discontinue as indicated. On  exam, patient with mild edema in bilateral feet, not appreciated elsewhere on exam. Noted improvement in edema with IV Lasix. Will continue to monitor daily and dose as needed.  - Lasix 10mg  IV x1 9/17  - Daily standing weight  - Can consider oral Bumex   - STOP gabapentin 9/17; can consider restarting if no change in edema Multifactorial Ileus   Nausea (chronic)  [place] Emesis 2/2 NGT placement (resolved)  Contributors with history of narcotics, poor PO intake. Concern for malnutrition. Improvements to abdominal pain since weaning opioids. TPN has been discontinued with patient now tolerating tube feeds. Patient with chronic nausea that was previously managed with Zofran, though transitioned to phenergan due to prolonged Qtc. Upon admission to [facility], continued nightly tube feeds with Corpak NG tube in place, and PO intake as tolerated during the day. Will continue to decrease tube feeds with goal of discharge from [facility] on full regular diet. If unable to tolerate full PO diet, will consider discharge home with corpak. Patient noted weight gain upon arrival to AIR (felt likely fluid retention) as weight was increased in a short period of time.   - Dietician following; appreciate care and recommendations  - Continue nightly tube feeds  - Continue PO regular diet + protein supplementation; anticipate NG tube removal prior to discharge   - Calorie count  - Daily standing weights   - Continue Protonix 40mg  BID  - Continue IV phenergan 6.5mg  Q6 PRN; will continue to monitor and consider scopolamine patch in hopes of transitioning off IV  [ ]  restart home cefdinir and doxycycline upon discharge from AIR for SIBP     Syncope  Multifactorial. Primary team ddx includes: vasovagal vs adverse effect of medications vs deconditioning vs iron deficiency. Last echo 04/2022. Stable upon admission to [facility].   - CTM     IDA  Baseline hemoglobin ~9-10. Stable upon admission to AIR at 9.8. Will continue to monitor while admitted.   - M/Th CBC  - Has allergy to formula iron      [name] - Desmoid Tumors  Follows [facility] (Dr. [name]) is patient's outpatient oncologist. Per oncology, most common side effects of the chemotherapy are diarrhea, nausea, headache, and rash. Patient has not had increase in baseline nausea while taking the medication and increased dose as planned on 9/17. Will continue to monitor and discuss with outpatient provider as indicated.   - Nirogacestat 150mg  BID; increased 9/17  - M/Th CMP; monitor LFT     Enterobacteriaceae group bacteremia (Resolved) - C albicans fungemia (Resolved)  Likely due to RIJ tunneled line placed 01/2022 now removed on 8/14.  8/13, 8/17 repeat blood cultures NGTD.  No evidence of vitreitis, retinitis 8/15 per Ophtho.  TTE 8/15 w possible vegetations on MV and TV or indwelling catheter; however, TEE 8/20 w/o vegetations. ID consulted during acute admission and now s/p treatment with cephalosporin and fluconazole on 8/31. Patient noted to have difficult IV access and kept R IJ access upon admission to [facility].   [ ]  Remove R internal jugular access when able to wean off IV meds; will need to remove prior to discharge home     Generalized anxiety disorder, depression, h/o panic attacks  Psychiatry consulted during acute hospitalization with recommendations as below.   - Consider decreasing mirtazapine 15 mg nightly to 7.5 mg nightly due to somnolence  - Valium as above, if needed could give 2.5mg  PRN for acute anxiety  - Neuropsychology consulted; appreciate care  Daily Checklist  - Diet: Regular diet + nighttime tube feeds  - DVT PPX: SQ enoxaparin   - GI PPX: Protonix  - Access: R IJ non tunneled CVC. Necessary for IV medications; will remove when able.    DISPO: Patient to be discussed at weekly interdisciplinary team conference.   - EDD: 12/10/2022  - Follow-up: PCP, Chronic Pain, Oncology (Dr. [name]), Psych    SUBJECTIVE and PROGRESS WITH FUNCTIONAL ACTIVITIES:     Interval Events:   Note increased itching surrounding her IJ, per nursing adhesive. No continued concerns with itching. She had increased nausea with the tube feeds being increased to 57mL/hr and changed to high protein formulation. She and her mom noted in the past that she hasn't been able to tolerate much more than a rate of 56mL/hr. Will continue current formulation at 30mL/hr and re-evaluate nausea.     Urine output 1.2L with 1x 10mg  IV Lasix and patient reported feeling better with abdominal swelling. Will plan for another one time dose of IV Lasix 10mg  today. Will also discontinue gabapentin today and evaluate in the coming days if there is any affect on overall swelling.     Patient expressed frustration with herself in not being able to participate as fully as she would have liked with OT this morning. Discussed having patience with herself and the typical course of rehab with some good days and some bad days. Will continue to address and neuro psych following.     OBJECTIVE:     Vital signs (last 24 hours):  Temp:  [36.7 ??C (98.1 ??F)-36.8 ??C (98.2 ??F)] 36.8 ??C (98.2 ??F)  Heart Rate:  [90-97] 90  Resp:  [17-19] 17  BP: (112-114)/(60-62) 112/62  MAP (mmHg):  [73-78] 78  SpO2:  [98 %-100 %] 100 %    Intake/Output (last 3 shifts):  I/O last 3 completed shifts:  In: 1265 [P.O.:200; I.V.:70; NG/GT:995]  Out: 2400 [Urine:2400]    Physical Exam:   GEN: Lying in bed in no acute distress, chronically ill appearing  EYES: sclera anicteric, conjunctiva clear   HEENT: corpak in place L nare. R I.J. in place (nontunneled)  CV: warm extremities, well perfused, no cyanosis, mild edema in bilateral feet (improved)  RESP:  No increased work of breathing on 2L [place]  ABD: Not distended  SKIN: No visible masses, lesions, rashes, ecchymoses.   MSK: Not visualized today, previously sacrum with some soft tissue swelling near lumbosacral junction.   NEURO: Alert, speech fluent  PSYCH: Mood euthymic, tearful affect    Medications:  Scheduled    buprenorphine-naloxone (SUBOXONE) 2-0.5 mg SL film 1 mg of buprenorphine TID    carboxymethylcellulose sodium (REFRESH CELLUVISC) 1 % ophthalmic gel 1 drop QID    diazePAM (VALIUM) tablet 2.5 mg Daily    And    diazePAM (VALIUM) tablet 2.5 mg Daily    diazePAM (VALIUM) tablet 5 mg Nightly    diclofenac sodium (VOLTAREN) 1 % gel 2 g QID    enoxaparin (LOVENOX) syringe 40 mg Q24H SCH    famotidine (PEPCID) tablet 20 mg Nightly    gabapentin (NEURONTIN) capsule 100 mg TID    lidocaine (ASPERCREME) 4 % 2 patch Daily    mirtazapine (REMERON) tablet 15 mg Nightly    multivitamin with folic acid 400 mcg tablet 1 tablet Daily    naloxegol (MOVANTIK) 12.5 mg tablet 12.5 mg Daily    nirogacestat (OGSIVEO) tablet 100 mg **Patient Supplied** BID    pantoprazole (Protonix) EC tablet 40  mg BID     PRN acetaminophen, 650 mg, Q6H PRN  alum-mag-simeth, 60 mL, Q6H PRN  baclofen, 10 mg, TID PRN  bisacodyl, 10 mg, BID PRN  butalbital-acetaminophen-caffeine, 1 tablet, BID PRN  carboxymethylcellulose sodium, 2 drop, QID PRN  Chemo Clarification Order, , Continuous PRN  Chemo Clarification Order, , Continuous PRN  diazePAM, 2.5 mg, Nightly PRN  guaiFENesin, 200 mg, Q4H PRN  hydrocortisone, , BID PRN  HYDROmorphone, 0.5 mg, Q6H PRN  IP okay to treat, , Continuous PRN  lidocaine-diphenhydrAMINE-aluminum-magnesium, 10 mL, QID PRN  ondansetron, 4 mg, Q12H PRN  oxyCODONE, 10 mg, Q4H PRN   Or  oxyCODONE, 15 mg, Q4H PRN  phenol, 2 spray, Q2H PRN  polyethylene glycol, 17 g, BID PRN  promethazine, 6.5 mg, Q6H PRN  pseudoephedrine, 30 mg, Daily PRN  senna, 1 tablet, BID PRN  zinc oxide-cod liver oil, , Daily PRN        Labs/Studies: Reviewed.    Radiology Results: Reviewed    Quality Indicators      Hearing, Speech, and Vision  Ability to Hear: Adequate  Ability to See in Adequate Light: Adequate  Expression of Ideas and Wants: Without difficulty  Understanding Verbal and Non-Verbal Content: Understands    Cognitive Pattern Assessment  Cognitive Pattern Assessment Used: BIMS  Brief Interview for Mental Status (BIMS)  Repetition of Three Words (First Attempt): 3  Temporal Orientation: Year: Correct  Temporal Orientation: Month: Accurate within 5 days  Temporal Orientation: Day: Correct  Recall: Sock: Yes, no cue required  Recall: Blue: Yes, no cue required  Recall: Bed: Yes, no cue required  BIMS Summary Score: 15       Nutritional Approaches  Nutritional Approach: Feeding tube    ADLs  Admission Current   Eating Assistance Needed: Set-up / clean-up    CARE Score - 5 Assistance Needed: Set-up / clean-up    CARE Score - 5   Oral Hygiene  Assistance Needed: Set-up / clean-up    CARE Score - 5  Assistance Needed: Set-up / clean-up    CARE Score - 5   Bladder Continence       Bowel Continence       Toileting Hygiene Assistance Needed: Physical assistance Total assistance  CARE Score - 1 Assistance Needed: Physical assistance Total assistance  CARE Score - 1    Toilet Transfer Assistance Needed: Physical assistance Total assistance  CARE Score - 88 Assistance Needed: Physical assistance Total assistance  CARE Score - 1   Shower/Bathe Self Assistance Needed: Physical assistance 26%-50%  CARE Score - 88  Assistance Needed: Physical assistance 26%-50%  CARE Score - 3   Upper Body Dressing Assistance Needed: Physical assistance 26%-50%  CARE Score - 3  Assistance Needed: Physical assistance 26%-50%  CARE Score - 3   Lower Body Dressing Assistance Needed: Physical assistance Total assistance  CARE Score - 1  Assistance Needed: Physical assistance 76% or more  CARE Score - 2   On/Off Footwear Assistance Needed: Physical assistance Total assistance  CARE Score - 1  Assistance Needed: Physical assistance Total assistance  CARE Score - 1         Transfers Admission Current   Bed to Chair Assistance Needed: Physical assistance Total assistance  CARE Score - 1  Assistance Needed: Physical assistance    CARE Score - 1     Lying to Sitting Assistance Needed: Physical assistance Total assistance  CARE Score - 1  Assistance Needed: Physical assistance  CARE Score - 1   Roll Left/Right Assistance Needed: Physical assistance 76% or more  CARE Score - 2  Assistance Needed: Physical assistance    CARE Score - 2   Sit to Lying Assistance Needed: Physical assistance Total assistance  CARE Score - 1 Assistance Needed: Physical assistance    CARE Score - 1   Sit to Stand Assistance Needed: Physical assistance Total assistance  CARE Score - 1  Assistance Needed: Physical assistance    CARE Score - 1         Mobility Admission Current   Walk 10 Feet Assistance Needed: Physical assistance Total assistance  CARE Score - 1 Assistance Needed: Physical assistance    CARE Score - 1     Walk 50 Feet 2 Turns      CARE Score - 88      CARE Score - 88   Walk 150 Feet      CARE Score - 88       CARE Score - 88   Walk 10 Feet Uneven      CARE Score - 88       CARE Score - 88   1 Step (Curb)      CARE Score - 88       CARE Score - 88   4 Steps      CARE Score - 88       CARE Score - 88   12 Steps      CARE Score - 88       CARE Score - 88   Picking Up Object      CARE Score - 88       CARE Score - 88   Wheelchair/Scooter Use        Wheel 50 Feet 2 Turns Assistance Needed: Physical assistance Total assistance    CARE Score - Wheel 50 Feet with Two Turns: 1    Type of Wheelchair/Scooter: Manual Assistance Needed: Physical assistance      CARE Score - Wheel 50 Feet with Two Turns: 1    Type of Wheelchair/Scooter: Manual   Wheel 150 Feet Assistance Needed: Physical assistance Total assistance    CARE Score - Wheel 150 Feet: 1    Type of Wheelchair/Scooter: Manual  Assistance Needed: Physical assistance      CARE Score - Wheel 150 Feet: 1    Type of Wheelchair/Scooter: Manual        ATTENDING PHYSICIAN ATTESTATION:     I saw and evaluated the patient, participating in the key portions of the service.  I reviewed the resident's note.  I agree with the resident's findings and plan.     Zenovia Jarred III, MD  Associate Professor  Department of Physical Medicine & Rehabilitation

## 2022-11-26 MED ADMIN — buprenorphine-naloxone (SUBOXONE) 2-0.5 mg SL film 1 mg of buprenorphine: .5 | SUBLINGUAL | @ 14:00:00

## 2022-11-26 MED ADMIN — buprenorphine-naloxone (SUBOXONE) 2-0.5 mg SL film 1 mg of buprenorphine: .5 | SUBLINGUAL | @ 20:00:00

## 2022-11-26 MED ADMIN — multivitamin with folic acid 400 mcg tablet 1 tablet: 1 | ORAL | @ 14:00:00

## 2022-11-26 MED ADMIN — ondansetron (ZOFRAN) injection 4 mg: 4 mg | INTRAVENOUS | @ 21:00:00

## 2022-11-26 MED ADMIN — diazePAM (VALIUM) tablet 2.5 mg: 2.5 mg | ORAL | @ 19:00:00

## 2022-11-26 MED ADMIN — diclofenac sodium (VOLTAREN) 1 % gel 2 g: 2 g | TOPICAL | @ 17:00:00

## 2022-11-26 MED ADMIN — ondansetron (ZOFRAN) injection 4 mg: 4 mg | INTRAVENOUS | @ 05:00:00

## 2022-11-26 MED ADMIN — HYDROmorphone (PF) (DILAUDID) injection 0.5 mg: .5 mg | INTRAVENOUS | @ 05:00:00 | Stop: 2022-11-29

## 2022-11-26 MED ADMIN — pantoprazole (Protonix) EC tablet 40 mg: 40 mg | ORAL | @ 14:00:00

## 2022-11-26 MED ADMIN — carboxymethylcellulose sodium (REFRESH CELLUVISC) 1 % ophthalmic gel 1 drop: 1 [drp] | OPHTHALMIC | @ 14:00:00

## 2022-11-26 MED ADMIN — HYDROmorphone (PF) (DILAUDID) injection 0.5 mg: .5 mg | INTRAVENOUS | @ 21:00:00 | Stop: 2022-11-29

## 2022-11-26 MED ADMIN — buprenorphine-naloxone (SUBOXONE) 2-0.5 mg SL film 1 mg of buprenorphine: .5 | SUBLINGUAL | @ 02:00:00

## 2022-11-26 MED ADMIN — oxyCODONE (ROXICODONE) immediate release tablet 15 mg: 15 mg | ORAL | @ 02:00:00 | Stop: 2022-11-28

## 2022-11-26 MED ADMIN — nirogacestat (OGSIVEO) tablet 150 mg: 150 mg | ORAL | @ 22:00:00 | Stop: 2022-12-26

## 2022-11-26 MED ADMIN — oxyCODONE (ROXICODONE) immediate release tablet 15 mg: 15 mg | ORAL | @ 10:00:00 | Stop: 2022-11-28

## 2022-11-26 MED ADMIN — carboxymethylcellulose sodium (REFRESH CELLUVISC) 1 % ophthalmic gel 1 drop: 1 [drp] | OPHTHALMIC | @ 02:00:00

## 2022-11-26 MED ADMIN — diclofenac sodium (VOLTAREN) 1 % gel 2 g: 2 g | TOPICAL | @ 14:00:00

## 2022-11-26 MED ADMIN — pantoprazole (Protonix) EC tablet 40 mg: 40 mg | ORAL | @ 02:00:00

## 2022-11-26 MED ADMIN — famotidine (PEPCID) tablet 20 mg: 20 mg | ORAL | @ 02:00:00

## 2022-11-26 MED ADMIN — naloxegol (MOVANTIK) 12.5 mg tablet 12.5 mg: 12.5 mg | ORAL | @ 14:00:00

## 2022-11-26 MED ADMIN — HYDROmorphone (PF) (DILAUDID) injection 0.5 mg: .5 mg | INTRAVENOUS | @ 14:00:00 | Stop: 2022-11-29

## 2022-11-26 MED ADMIN — bumetanide (BUMEX) tablet 0.5 mg: .5 mg | ORAL | @ 17:00:00 | Stop: 2022-11-26

## 2022-11-26 MED ADMIN — promethazine (PHENERGAN) 6.5 mg in sodium chloride (NS) 0.9 % 50 mL IVPB: 6.5 mg | INTRAVENOUS | @ 02:00:00

## 2022-11-26 MED ADMIN — promethazine (PHENERGAN) 6.5 mg in sodium chloride (NS) 0.9 % 50 mL IVPB: 6.5 mg | INTRAVENOUS | @ 14:00:00

## 2022-11-26 MED ADMIN — diazePAM (VALIUM) tablet 2.5 mg: 2.5 mg | ORAL | @ 14:00:00

## 2022-11-26 MED ADMIN — nirogacestat (OGSIVEO) tablet 150 mg: 150 mg | ORAL | @ 10:00:00 | Stop: 2022-12-26

## 2022-11-26 MED ADMIN — diazePAM (VALIUM) tablet 5 mg: 5 mg | ORAL | @ 02:00:00

## 2022-11-26 NOTE — Unmapped (Signed)
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 Admitted to [facility] on 09/25/2022 for ileus, inability to tolerate PO now on supplemental tube feed, and diffuse pain due to muscle spasms of uncertain etiology.      Rehab Impairment Group Code [facility]): (Debility) 16 Debility (Non-Cardiac/Non-Pulmonary)  Etiology: Diffuse pain due to muscle spasms of uncertain etiology and cutaneous desmoid tumors    PLAN:     This patient is admitted to the Physical Medicine and Rehabilitation - Inpatient - C service from 8am-5pm on weekdays for questions regarding this patient. After hours, weekends, and holidays please contact the 1st Call resident pager     REHAB:   - PT and OT to maximize functional status with mobility and ADLs as well as prevention of joint contracture.   - Neuropsych for higher level cognitive evaluation and coping.  - RT for community re-integration, education, and leisure support services.  - Nutrition consult for diet information/teaching.   - To be discussed in weekly Interdisciplinary Team Conference.     Increased Tonicity  Dystonia   Started post-procedurally per patient ~09/25/22. Occurs intermittently throughout the day with a heightened occurrence during mobilization. At rest patient bilateral ankles in plantarflexion with worsening heel cord tightening. Has tonicity most prominent to HF and KE that is not velocity dependent. Has not noted significant improvement with increase of baclofen to 15 mg TID in setting of infection as below. Neurology previously evaluated patient seemingly ruling out an acute dystonic reaction, cord compression, central process, nutritional deficiency, stiff person syndrome and [name] after normal EMG on 8/22. Continues to be well controlled with valium, small improvement with baclofen PRN.  - Continue Baclofen 10 mg TID prn  - Continued Valium 2.5/2.5/5 mg TID and 2.5 mg nightly PRN  - PRAFO boots in bed     Headache  Currently managed with tylenol. She does have some posterior neck muscle tightness. Notes ketamine is helpful for headache pains. Describes photophobia and phonophobia with events. She does have loss of cervical lordosis on most recent MRI.   - Consider starting propranolol 5 mg BID for headache prophylaxis.   - PRN tylenol +/- prn fiorcet   - Lidocaine cream PRN to area of pain for cervicogenic headaches     Pain  Pain management consulted with recommendations as below. Patient will need to follow up with outpatient chronic pain provider ([name]).  - Tylenol 650 mg q6h PRN  - IV Dilaudid 0.5 mg q6h PRN; will wean as tolerated.  - Oxyocodone 10-15 mg q4h PRN  - Suboxone 2-0.5 mg TID  - STOP Gabapentin 100 mg TID, 9/17 per patient preference (see below with anasarca related to Lyrica)  - Voltaren gel QID, lidocaine patches  - Continue patellar taping with therapy or obtain patellar brace to help with pain with ambulation  - Continue Movantik 12.5mg  daily; discontinue upon discharge from AIR    Anasarca  Patient noted increased lower extremity, facial, and abdominal swelling prior to admission to AIR. Reported that pitting edema was present when taking Lyrica. No current medications with significant side effects of swelling. Requested IV Lasix as this was helpful in the past. Noted that some edema could be related to gabapentin, will plan to monitor and discontinue as indicated. On  exam, patient with mild edema in bilateral feet, not appreciated elsewhere on exam. Noted improvement in edema with IV Lasix. Will continue to monitor daily and dose as needed. Will plan to try oral Bumex today and continue to monitor.   - Bumex 0.5mg  x1 9/18  - Daily standing weight  - Increase protein intake per dietician  - STOP gabapentin 9/17; can consider restarting if no change in edema     Multifactorial Ileus   Nausea (chronic)  [place] Emesis 2/2 NGT placement (resolved)  Contributors with history of narcotics, poor PO intake. Concern for malnutrition. Improvements to abdominal pain since weaning opioids. TPN has been discontinued with patient now tolerating tube feeds. Patient with chronic nausea that was previously managed with Zofran, though transitioned to phenergan due to prolonged Qtc. Upon admission to AIR, continued nightly tube feeds with Corpak NG tube in place, and PO intake as tolerated during the day. Will continue to decrease tube feeds with goal of discharge from AIR on full regular diet. If unable to tolerate full PO diet, will consider discharge home with corpak. Patient noted weight gain upon arrival to AIR (felt likely fluid retention) as weight was increased in a short period of time.   - Dietician following; appreciate care and recommendations  - Continue nightly tube feeds  - Continue PO regular diet + protein supplementation; anticipate NG tube removal prior to discharge   - Calorie count  - Daily standing weights   - Continue Protonix 40mg  BID  - Continue IV phenergan 6.5mg  Q6 PRN; will continue to monitor and consider scopolamine patch in hopes of transitioning off IV  [ ]  restart home cefdinir and doxycycline upon discharge from AIR for SIBP     Syncope  Multifactorial. Primary team ddx includes: vasovagal vs adverse effect of medications vs deconditioning vs iron deficiency. Last echo 04/2022. Stable upon admission to AIR.   - CTM     IDA  Baseline hemoglobin ~9-10. Stable upon admission to AIR at 9.8. Will continue to monitor while admitted.   - M/Th CBC  - Has allergy to formula iron      [name] - Desmoid Tumors  Follows [facility] (Dr. [name]) is patient's outpatient oncologist. Per oncology, most common side effects of the chemotherapy are diarrhea, nausea, headache, and rash. Patient has not had increase in baseline nausea while taking the medication and increased dose as planned on 9/17. Will continue to monitor and discuss with outpatient provider as indicated.   - Nirogacestat 150mg  BID; increased 9/17  - M/Th CMP; monitor LFT     Enterobacteriaceae group bacteremia (Resolved) - C albicans fungemia (Resolved)  Likely due to RIJ tunneled line placed 01/2022 now removed on 8/14.  8/13, 8/17 repeat blood cultures NGTD.  No evidence of vitreitis, retinitis 8/15 per Ophtho.  TTE 8/15 w possible vegetations on MV and TV or indwelling catheter; however, TEE 8/20 w/o vegetations. ID consulted during acute admission and now s/p treatment with cephalosporin and fluconazole on 8/31. Patient noted to have difficult IV access and kept R IJ access upon admission to [facility].   [ ]  Remove R internal jugular access when able to wean off IV meds; will need to remove prior to discharge home     Generalized anxiety disorder, depression, h/o panic attacks  Psychiatry consulted during acute hospitalization with recommendations as below.   - Consider decreasing mirtazapine 15 mg nightly to 7.5 mg nightly due to somnolence  - Valium as above, if needed  could give 2.5mg  PRN for acute anxiety  - Neuropsychology consulted; appreciate care     Daily Checklist  - Diet: Regular diet + nighttime tube feeds  - DVT PPX: SQ enoxaparin   - GI PPX: Protonix  - Access: R IJ non tunneled CVC. Necessary for IV medications; will remove when able.    DISPO: Patient to be discussed at weekly interdisciplinary team conference.   - EDD: 12/10/2022  - Follow-up: PCP, Chronic Pain, Oncology (Dr. [name]), Psych    SUBJECTIVE and PROGRESS WITH FUNCTIONAL ACTIVITIES:     Interval Events:   NAEON. Patient notes improvement in her nausea with decrease in tube feed rate. Feels like the IV lasix has helped her swelling. Discussed trying oral bumex today instead of IV lasix, patient in agreement. Working with dietician on increasing protein intake, patient continuing to work on oral intake.     OBJECTIVE:     Vital signs (last 24 hours):  Temp:  [36.7 ??C (98.1 ??F)] 36.7 ??C (98.1 ??F)  Heart Rate:  [94-95] 95  Resp:  [17-18] 17  BP: (110-117)/(63-65) 110/63  MAP (mmHg):  [77-80] 77  SpO2:  [100 %] 100 %    Intake/Output (last 3 shifts):  I/O last 3 completed shifts:  In: 595 [I.V.:40; NG/GT:555]  Out: 2800 [Urine:2800]    Physical Exam:   GEN: Sitting comfortably in recliner  EYES: sclera anicteric, conjunctiva clear   HEENT: corpak in place L nare. R I.J. in place (nontunneled)  CV: Warm and well perfused, trace edema in bilateral feet (improved)  RESP:  No increased work of breathing on 2L [place]  ABD: Not distended  SKIN: No visible masses, lesions, rashes, ecchymoses.   MSK: Not visualized today, previously sacrum with some soft tissue swelling near lumbosacral junction.   NEURO: Alert, speech fluent  PSYCH: Mood euthymic, optimistic affect    Medications:  Scheduled    buprenorphine-naloxone (SUBOXONE) 2-0.5 mg SL film 1 mg of buprenorphine TID    carboxymethylcellulose sodium (REFRESH CELLUVISC) 1 % ophthalmic gel 1 drop QID    diazePAM (VALIUM) tablet 2.5 mg Daily    And    diazePAM (VALIUM) tablet 2.5 mg Daily    diazePAM (VALIUM) tablet 5 mg Nightly    diclofenac sodium (VOLTAREN) 1 % gel 2 g QID    enoxaparin (LOVENOX) syringe 40 mg Q24H SCH    famotidine (PEPCID) tablet 20 mg Nightly    lidocaine (ASPERCREME) 4 % 2 patch Daily    mirtazapine (REMERON) tablet 15 mg Nightly    multivitamin with folic acid 400 mcg tablet 1 tablet Daily    naloxegol (MOVANTIK) 12.5 mg tablet 12.5 mg Daily    nirogacestat (OGSIVEO) tablet 150 mg BID    pantoprazole (Protonix) EC tablet 40 mg BID     PRN acetaminophen, 650 mg, Q6H PRN  alum-mag-simeth, 60 mL, Q6H PRN  baclofen, 10 mg, TID PRN  bisacodyl, 10 mg, BID PRN  butalbital-acetaminophen-caffeine, 1 tablet, BID PRN  carboxymethylcellulose sodium, 2 drop, QID PRN  Chemo Clarification Order, , Continuous PRN  Chemo Clarification Order, , Continuous PRN  diazePAM, 2.5 mg, Nightly PRN  guaiFENesin, 200 mg, Q4H PRN  hydrocortisone, , BID PRN  HYDROmorphone, 0.5 mg, Q6H PRN  IP okay to treat, , Continuous PRN  lidocaine-diphenhydrAMINE-aluminum-magnesium, 10 mL, QID PRN  ondansetron, 4 mg, Q12H PRN  oxyCODONE, 10 mg, Q4H PRN   Or  oxyCODONE, 15 mg, Q4H PRN  phenol, 2 spray, Q2H PRN  polyethylene glycol, 17 g,  BID PRN  promethazine, 6.5 mg, Q6H PRN  pseudoephedrine, 30 mg, Daily PRN  senna, 1 tablet, BID PRN  zinc oxide-cod liver oil, , Daily PRN        Labs/Studies: Reviewed.    Radiology Results: Reviewed    Quality Indicators      Hearing, Speech, and Vision  Ability to Hear: Adequate  Ability to See in Adequate Light: Adequate  Expression of Ideas and Wants: Without difficulty  Understanding Verbal and Non-Verbal Content: Understands    Cognitive Pattern Assessment  Cognitive Pattern Assessment Used: BIMS  Brief Interview for Mental Status (BIMS)  Repetition of Three Words (First Attempt): 3  Temporal Orientation: Year: Correct  Temporal Orientation: Month: Accurate within 5 days  Temporal Orientation: Day: Correct  Recall: Sock: Yes, no cue required  Recall: Blue: Yes, no cue required  Recall: Bed: Yes, no cue required  BIMS Summary Score: 15       Nutritional Approaches  Nutritional Approach: Feeding tube    ADLs  Admission Current   Eating Assistance Needed: Set-up / clean-up    CARE Score - 5 Assistance Needed: Set-up / clean-up    CARE Score - 5   Oral Hygiene  Assistance Needed: Set-up / clean-up    CARE Score - 5  Assistance Needed: Set-up / clean-up    CARE Score - 5   Bladder Continence       Bowel Continence       Toileting Hygiene Assistance Needed: Physical assistance Total assistance  CARE Score - 1 Assistance Needed: Physical assistance Total assistance  CARE Score - 1    Toilet Transfer Assistance Needed: Physical assistance Total assistance  CARE Score - 88 Assistance Needed: Physical assistance 25% or less  CARE Score - 3   Shower/Bathe Self Assistance Needed: Physical assistance 26%-50%  CARE Score - 88  Assistance Needed: Physical assistance    CARE Score - 3   Upper Body Dressing Assistance Needed: Physical assistance 26%-50%  CARE Score - 3  Assistance Needed: Physical assistance    CARE Score - 3   Lower Body Dressing Assistance Needed: Physical assistance Total assistance  CARE Score - 1  Assistance Needed: Physical assistance    CARE Score - 2   On/Off Footwear Assistance Needed: Physical assistance Total assistance  CARE Score - 1  Assistance Needed: Physical assistance Total assistance  CARE Score - 1         Transfers Admission Current   Bed to Chair Assistance Needed: Physical assistance Total assistance  CARE Score - 1  Assistance Needed: Physical assistance    CARE Score - 1     Lying to Sitting Assistance Needed: Physical assistance Total assistance  CARE Score - 1  Assistance Needed: Physical assistance    CARE Score - 1   Roll Left/Right Assistance Needed: Physical assistance 76% or more  CARE Score - 2  Assistance Needed: Physical assistance    CARE Score - 2   Sit to Lying Assistance Needed: Physical assistance Total assistance  CARE Score - 1  Assistance Needed: Physical assistance    CARE Score - 1   Sit to Stand Assistance Needed: Physical assistance Total assistance  CARE Score - 1  Assistance Needed: Physical assistance    CARE Score - 1         Mobility Admission Current   Walk 10 Feet Assistance Needed: Physical assistance Total assistance  CARE Score - 1 Assistance Needed: Physical assistance    CARE Score -  1     Walk 50 Feet 2 Turns      CARE Score - 88      CARE Score - 88   Walk 150 Feet      CARE Score - 88       CARE Score - 88   Walk 10 Feet Uneven      CARE Score - 88       CARE Score - 88   1 Step (Curb)      CARE Score - 88       CARE Score - 88   4 Steps      CARE Score - 88       CARE Score - 88   12 Steps      CARE Score - 88       CARE Score - 88   Picking Up Object CARE Score - 88       CARE Score - 88   Wheelchair/Scooter Use        Wheel 50 Feet 2 Turns Assistance Needed: Physical assistance Total assistance    CARE Score - Wheel 50 Feet with Two Turns: 1    Type of Wheelchair/Scooter: Manual Assistance Needed: Physical assistance      CARE Score - Wheel 50 Feet with Two Turns: 1    Type of Wheelchair/Scooter: Manual   Wheel 150 Feet Assistance Needed: Physical assistance Total assistance    CARE Score - Wheel 150 Feet: 1    Type of Wheelchair/Scooter: Manual  Assistance Needed: Physical assistance      CARE Score - Wheel 150 Feet: 1    Type of Wheelchair/Scooter: Manual        ATTENDING PHYSICIAN ATTESTATION:     I saw and evaluated the patient, participating in the key portions of the service.  I reviewed the resident's note.  I agree with the resident's findings and plan.     Zenovia Jarred III, MD  Associate Professor  Department of Physical Medicine & Rehabilitation

## 2022-11-26 NOTE — Unmapped (Signed)
Pt A&Ox4, mother at bedside overnight. Cont B&B, PRN pain meds and nausea meds given. R. IJ patent with good blood return. Dressing clean , dry and intact. NS continuous per orders. Tube feeds via NG tube to L. nare, infusing at 47mL/hr. Pt up with sara steady x2 assist. Pills whole in applesauce. Falls, skin, ad safety precautions in place. Bed in low locked position, call bell within reach.     Problem: Rehabilitation (IRF) Plan of Care  Goal: Plan of Care Review  11/26/2022 0209 by Sharlett Iles, RN  Outcome: Progressing  11/25/2022 2306 by Sharlett Iles, RN  Outcome: Progressing  Goal: Patient-Specific Goal (Individualized)  11/26/2022 0209 by Sharlett Iles, RN  Outcome: Progressing  11/25/2022 2306 by Sharlett Iles, RN  Outcome: Progressing  Goal: Absence of New-Onset Illness or Injury  11/26/2022 0209 by Sharlett Iles, RN  Outcome: Progressing  11/25/2022 2306 by Sharlett Iles, RN  Outcome: Progressing  Intervention: Prevent Fall and Fall Injury  Recent Flowsheet Documentation  Taken 11/26/2022 0200 by Sharlett Iles, RN  Safety Interventions:   fall reduction program maintained   family at bedside  Taken 11/26/2022 0000 by Sharlett Iles, RN  Safety Interventions:   fall reduction program maintained   family at bedside  Taken 11/25/2022 2200 by Sharlett Iles, RN  Safety Interventions:   fall reduction program maintained   family at bedside  Taken 11/25/2022 2000 by Sharlett Iles, RN  Safety Interventions:   fall reduction program maintained   family at bedside  Goal: Optimal Comfort and Wellbeing  11/26/2022 0209 by Sharlett Iles, RN  Outcome: Progressing  11/25/2022 2306 by Sharlett Iles, RN  Outcome: Progressing  Goal: Home and Community Transition Plan Established  11/26/2022 0209 by Sharlett Iles, RN  Outcome: Progressing  11/25/2022 2306 by Sharlett Iles, RN  Outcome: Progressing  Goal: Rounds/Family Conference  11/26/2022 0209 by Sharlett Iles, RN  Outcome: Progressing  11/25/2022 2306 by Sharlett Iles, RN  Outcome: Progressing     Problem: Self-Care Deficit  Goal: Improved Ability to Complete Activities of Daily Living  11/26/2022 0209 by Sharlett Iles, RN  Outcome: Progressing  11/25/2022 2306 by Sharlett Iles, RN  Outcome: Progressing     Problem: Skin Injury Risk Increased  Goal: Skin Health and Integrity  11/26/2022 0209 by Sharlett Iles, RN  Outcome: Progressing  11/25/2022 2306 by Sharlett Iles, RN  Outcome: Progressing  Intervention: Optimize Skin Protection  Recent Flowsheet Documentation  Taken 11/25/2022 2200 by Sharlett Iles, RN  Pressure Reduction Techniques: frequent weight shift encouraged  Taken 11/25/2022 2000 by Sharlett Iles, RN  Pressure Reduction Techniques: frequent weight shift encouraged     Problem: Pain Acute  Goal: Optimal Pain Control and Function  11/26/2022 0209 by Sharlett Iles, RN  Outcome: Progressing  11/25/2022 2306 by Sharlett Iles, RN  Outcome: Progressing     Problem: Fall Injury Risk  Goal: Absence of Fall and Fall-Related Injury  11/26/2022 0209 by Sharlett Iles, RN  Outcome: Progressing  11/25/2022 2306 by Sharlett Iles, RN  Outcome: Progressing  Intervention: Promote Injury-Free Environment  Recent Flowsheet Documentation  Taken 11/26/2022 0200 by Sharlett Iles, RN  Safety Interventions:   fall reduction program maintained   family at bedside  Taken 11/26/2022 0000 by Sharlett Iles, RN  Safety Interventions:   fall reduction program maintained   family at bedside  Taken 11/25/2022 2200 by Sharlett Iles, RN  Safety Interventions:   fall reduction program maintained   family at bedside  Taken 11/25/2022 2000 by Annia Friendly,  Mersadies Petree, RN  Safety Interventions:   fall reduction program maintained   family at bedside

## 2022-11-27 LAB — COMPREHENSIVE METABOLIC PANEL
ALBUMIN: 3.1 g/dL — ABNORMAL LOW (ref 3.4–5.0)
ALKALINE PHOSPHATASE: 83 U/L (ref 46–116)
ALT (SGPT): 9 U/L — ABNORMAL LOW (ref 10–49)
ANION GAP: 3 mmol/L — ABNORMAL LOW (ref 5–14)
AST (SGOT): 17 U/L (ref <=34)
BILIRUBIN TOTAL: 0.2 mg/dL — ABNORMAL LOW (ref 0.3–1.2)
BLOOD UREA NITROGEN: 9 mg/dL (ref 9–23)
BUN / CREAT RATIO: 19
CALCIUM: 9.1 mg/dL (ref 8.7–10.4)
CHLORIDE: 109 mmol/L — ABNORMAL HIGH (ref 98–107)
CO2: 31.3 mmol/L — ABNORMAL HIGH (ref 20.0–31.0)
CREATININE: 0.48 mg/dL — ABNORMAL LOW
EGFR CKD-EPI (2021) FEMALE: >90 mL/min/{1.73_m2} (ref >=60)
GLUCOSE RANDOM: 118 mg/dL — ABNORMAL HIGH (ref 70–99)
POTASSIUM: 3.4 mmol/L (ref 3.4–4.8)
PROTEIN TOTAL: 6.2 g/dL (ref 5.7–8.2)
SODIUM: 143 mmol/L (ref 135–145)

## 2022-11-27 LAB — PHOSPHORUS: PHOSPHORUS: 3.9 mg/dL (ref 2.4–5.1)

## 2022-11-27 LAB — CBC
HEMATOCRIT: 30.2 % — ABNORMAL LOW (ref 34.0–44.0)
HEMOGLOBIN: 10.2 g/dL — ABNORMAL LOW (ref 11.3–14.9)
MEAN CORPUSCULAR HEMOGLOBIN CONC: 33.7 g/dL (ref 32.0–36.0)
MEAN CORPUSCULAR HEMOGLOBIN: 28.3 pg (ref 25.9–32.4)
MEAN CORPUSCULAR VOLUME: 84 fL (ref 77.6–95.7)
MEAN PLATELET VOLUME: 9.6 fL (ref 6.8–10.7)
PLATELET COUNT: 223 10*9/L (ref 150–450)
RED BLOOD CELL COUNT: 3.59 10*12/L — ABNORMAL LOW (ref 3.95–5.13)
RED CELL DISTRIBUTION WIDTH: 16.1 % — ABNORMAL HIGH (ref 12.2–15.2)
WBC ADJUSTED: 7.2 10*9/L (ref 3.6–11.2)

## 2022-11-27 MED ADMIN — nirogacestat (OGSIVEO) tablet 150 mg: 150 mg | ORAL | @ 11:00:00 | Stop: 2022-12-26

## 2022-11-27 MED ADMIN — pantoprazole (Protonix) EC tablet 40 mg: 40 mg | ORAL | @ 13:00:00

## 2022-11-27 MED ADMIN — buprenorphine-naloxone (SUBOXONE) 2-0.5 mg SL film 1 mg of buprenorphine: .5 | SUBLINGUAL | @ 19:00:00

## 2022-11-27 MED ADMIN — carboxymethylcellulose sodium (REFRESH CELLUVISC) 1 % ophthalmic gel 1 drop: 1 [drp] | OPHTHALMIC | @ 13:00:00

## 2022-11-27 MED ADMIN — buprenorphine-naloxone (SUBOXONE) 2-0.5 mg SL film 1 mg of buprenorphine: .5 | SUBLINGUAL | @ 01:00:00

## 2022-11-27 MED ADMIN — diclofenac sodium (VOLTAREN) 1 % gel 2 g: 2 g | TOPICAL | @ 17:00:00

## 2022-11-27 MED ADMIN — diazePAM (VALIUM) tablet 5 mg: 5 mg | ORAL | @ 01:00:00

## 2022-11-27 MED ADMIN — naloxegol (MOVANTIK) 12.5 mg tablet 12.5 mg: 12.5 mg | ORAL | @ 13:00:00

## 2022-11-27 MED ADMIN — HYDROmorphone (PF) (DILAUDID) injection 0.5 mg: .5 mg | INTRAVENOUS | Stop: 2022-11-29

## 2022-11-27 MED ADMIN — HYDROmorphone (PF) (DILAUDID) injection 0.5 mg: .5 mg | INTRAVENOUS | @ 10:00:00 | Stop: 2022-11-29

## 2022-11-27 MED ADMIN — carboxymethylcellulose sodium (REFRESH CELLUVISC) 1 % ophthalmic gel 1 drop: 1 [drp] | OPHTHALMIC | @ 01:00:00

## 2022-11-27 MED ADMIN — oxyCODONE (ROXICODONE) immediate release tablet 15 mg: 15 mg | ORAL | @ 01:00:00 | Stop: 2022-11-28

## 2022-11-27 MED ADMIN — buprenorphine-naloxone (SUBOXONE) 2-0.5 mg SL film 1 mg of buprenorphine: .5 | SUBLINGUAL | @ 13:00:00

## 2022-11-27 MED ADMIN — diazePAM (VALIUM) tablet 2.5 mg: 2.5 mg | ORAL | @ 19:00:00

## 2022-11-27 MED ADMIN — oxyCODONE (ROXICODONE) immediate release tablet 15 mg: 15 mg | ORAL | @ 19:00:00 | Stop: 2022-11-28

## 2022-11-27 MED ADMIN — scopolamine (TRANSDERM-SCOP) 1 mg over 3 days topical patch 1 mg: 1 | TOPICAL | @ 21:00:00

## 2022-11-27 MED ADMIN — promethazine (PHENERGAN) 6.5 mg in sodium chloride (NS) 0.9 % 50 mL IVPB: 6.5 mg | INTRAVENOUS | @ 04:00:00

## 2022-11-27 MED ADMIN — diazePAM (VALIUM) tablet 2.5 mg: 2.5 mg | ORAL | @ 13:00:00

## 2022-11-27 MED ADMIN — ondansetron (ZOFRAN) injection 4 mg: 4 mg | INTRAVENOUS | @ 16:00:00

## 2022-11-27 MED ADMIN — famotidine (PEPCID) tablet 20 mg: 20 mg | ORAL | @ 01:00:00

## 2022-11-27 MED ADMIN — lidocaine (ASPERCREME) 4 % 2 patch: 2 | TRANSDERMAL | @ 13:00:00

## 2022-11-27 MED ADMIN — pantoprazole (Protonix) EC tablet 40 mg: 40 mg | ORAL | @ 01:00:00

## 2022-11-27 MED ADMIN — HYDROmorphone (PF) (DILAUDID) injection 0.5 mg: .5 mg | INTRAVENOUS | @ 04:00:00 | Stop: 2022-11-29

## 2022-11-27 MED ADMIN — furosemide (LASIX) injection 10 mg: 10 mg | INTRAVENOUS | @ 19:00:00 | Stop: 2022-11-27

## 2022-11-27 MED ADMIN — HYDROmorphone (PF) (DILAUDID) injection 0.5 mg: .5 mg | INTRAVENOUS | @ 16:00:00 | Stop: 2022-11-29

## 2022-11-27 NOTE — Unmapped (Signed)
Pt and mother provided calorie count print out to track caloric intake.

## 2022-11-27 NOTE — Unmapped (Addendum)
Pt and mom provided caloric intake sheet. Pt and mom reports pt has had 0% intake.

## 2022-11-27 NOTE — Unmapped (Addendum)
Daily weight has been requested from pt multiple times, however, pt prefers to do it at a later time.

## 2022-11-27 NOTE — Unmapped (Addendum)
Pt is awake and oriented x 4, on room air in the day, no tele. Takes meds whole with applesauce. Pt complained about pain overnight and was medicated with prn dilaudid and oxy. Continent x 2, LBM was today, 9/18. Able to make needs known. Pt is up to the recliner chair and back to bed. Tube feeding is currently going. PICC line about to flush. Skin, fall, safety and aspiration precautions maintained overnight. Call bell is within reach. Bed is locked and in the lowest position possible. Will continue to monitor the pt.    Problem: Rehabilitation (IRF) Plan of Care  Goal: Plan of Care Review  Outcome: Progressing  Goal: Patient-Specific Goal (Individualized)  Outcome: Progressing  Goal: Absence of New-Onset Illness or Injury  Outcome: Progressing  Intervention: Prevent Skin Injury  Recent Flowsheet Documentation  Taken 11/26/2022 2000 by Jari Sportsman, RN  Skin Protection: incontinence pads utilized  Goal: Optimal Comfort and Wellbeing  Outcome: Progressing  Goal: Home and Community Transition Plan Established  Outcome: Progressing  Goal: Rounds/Family Conference  Outcome: Progressing     Problem: Self-Care Deficit  Goal: Improved Ability to Complete Activities of Daily Living  Outcome: Progressing     Problem: Skin Injury Risk Increased  Goal: Skin Health and Integrity  Outcome: Progressing  Intervention: Optimize Skin Protection  Recent Flowsheet Documentation  Taken 11/26/2022 2000 by Jari Sportsman, RN  Pressure Reduction Techniques: frequent weight shift encouraged  Pressure Reduction Devices: positioning supports utilized  Skin Protection: incontinence pads utilized     Problem: Pain Acute  Goal: Optimal Pain Control and Function  Outcome: Progressing     Problem: Fall Injury Risk  Goal: Absence of Fall and Fall-Related Injury  Outcome: Progressing

## 2022-11-27 NOTE — Unmapped (Signed)
Pt has remained free from falls/injury/infection. Reports pain is adequately controlled. Communicates needs appropriately.      Goal: Absence of New-Onset Illness or Injury  Outcome: Progressing  Intervention: Prevent Fall and Fall Injury  Recent Flowsheet Documentation  Taken 11/27/2022 0800 by Colbert Coyer, RN  Safety Interventions: fall reduction program maintained  Intervention: Prevent Skin Injury  Recent Flowsheet Documentation  Taken 11/27/2022 0800 by Colbert Coyer, RN  Skin Protection: incontinence pads utilized  Intervention: Prevent Infection  Recent Flowsheet Documentation  Taken 11/27/2022 0800 by Colbert Coyer, RN  Infection Prevention:   rest/sleep promoted   hand hygiene promoted   equipment surfaces disinfected  Goal: Optimal Comfort and Wellbeing  Outcome: Progressing

## 2022-11-27 NOTE — Unmapped (Signed)
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 Admitted to [facility] on 09/25/2022 for ileus, inability to tolerate PO now on supplemental tube feed, and diffuse pain due to muscle spasms of uncertain etiology.      Rehab Impairment Group Code [facility]): (Debility) 16 Debility (Non-Cardiac/Non-Pulmonary)  Etiology: Diffuse pain due to muscle spasms of uncertain etiology and cutaneous desmoid tumors    PLAN:     This patient is admitted to the Physical Medicine and Rehabilitation - Inpatient - C service from 8am-5pm on weekdays for questions regarding this patient. After hours, weekends, and holidays please contact the 1st Call resident pager     REHAB:   - PT and OT to maximize functional status with mobility and ADLs as well as prevention of joint contracture.   - Neuropsych for higher level cognitive evaluation and coping.  - RT for community re-integration, education, and leisure support services.  - Nutrition consult for diet information/teaching.   - To be discussed in weekly Interdisciplinary Team Conference.     Increased Tonicity  Dystonia   Started post-procedurally per patient ~09/25/22. Occurs intermittently throughout the day with a heightened occurrence during mobilization. At rest patient bilateral ankles in plantarflexion with worsening heel cord tightening. Has tonicity most prominent to HF and KE that is not velocity dependent. Has not noted significant improvement with increase of baclofen to 15 mg TID in setting of infection as below. Neurology previously evaluated patient seemingly ruling out an acute dystonic reaction, cord compression, central process, nutritional deficiency, stiff person syndrome and [name] after normal EMG on 8/22. Continues to be well controlled with valium, small improvement with baclofen PRN.  - Continue Baclofen 10 mg TID prn  - Continued Valium 2.5/2.5/5 mg TID and 2.5 mg nightly PRN  - PRAFO boots in bed     Headache  Currently managed with tylenol. She does have some posterior neck muscle tightness. Notes ketamine is helpful for headache pains. Describes photophobia and phonophobia with events. She does have loss of cervical lordosis on most recent MRI.   - Consider starting propranolol 5 mg BID for headache prophylaxis.   - PRN tylenol +/- prn fiorcet   - Lidocaine cream PRN to area of pain for cervicogenic headaches     Pain  Pain management consulted with recommendations as below. Patient will need to follow up with outpatient chronic pain provider ([name]).  - Tylenol 650 mg q6h PRN  - IV Dilaudid 0.5 mg q6h PRN; will wean as tolerated.  - Oxyocodone 10-15 mg q4h PRN  - Suboxone 2-0.5 mg TID  - STOP Gabapentin 100 mg TID, 9/17 per patient preference (see below with anasarca related to Lyrica)  - Voltaren gel QID, lidocaine patches  - Continue patellar taping with therapy or obtain patellar brace to help with pain with ambulation  - Continue Movantik 12.5mg  daily; discontinue upon discharge from AIR    Anasarca  Patient noted increased lower extremity, facial, and abdominal swelling prior to admission to AIR. Reported that pitting edema was present when taking Lyrica. No current medications with significant side effects of swelling. Requested IV Lasix as this was helpful in the past. Noted that some edema could be related to gabapentin, will plan to monitor and discontinue as indicated. On  exam, patient with mild edema in bilateral feet, not appreciated elsewhere on exam. Noted improvement in edema with IV Lasix. Will continue to monitor daily and dose as needed. Oral Bumex trial 9/18 modestly helpful.    - Bumex 0.5mg  x1 9/18. Lasix IV 10mg  x1 9/19.  - Daily standing weight  - Increase protein intake per dietician  - STOP gabapentin 9/17; can consider restarting if no change in edema     Multifactorial Ileus   Nausea (chronic)  [place] Emesis 2/2 NGT placement (resolved)  Contributors with history of narcotics, poor PO intake. Concern for malnutrition. Improvements to abdominal pain since weaning opioids. TPN has been discontinued with patient now tolerating tube feeds. Patient with chronic nausea that was previously managed with Zofran, though transitioned to phenergan due to prolonged Qtc. Upon admission to AIR, continued nightly tube feeds with Corpak NG tube in place, and PO intake as tolerated during the day. Will continue to decrease tube feeds with goal of discharge from AIR on full regular diet. If unable to tolerate full PO diet, will consider discharge home with corpak. Patient noted weight gain upon arrival to AIR (felt likely fluid retention) as weight was increased in a short period of time.   - Dietician following; appreciate care and recommendations  - Continue nightly tube feeds  - Continue PO regular diet + protein supplementation; anticipate NG tube removal prior to discharge   - Calorie count  - Daily standing weights   - Continue Protonix 40mg  BID  - Continue IV phenergan 6.5mg  Q6 PRN; Scopolamine patch ordered 9/19 in hopes of transitioning off IV  [ ]  restart home cefdinir and doxycycline upon discharge from AIR for SIBP     Syncope  Multifactorial. Primary team ddx includes: vasovagal vs adverse effect of medications vs deconditioning vs iron deficiency. Last echo 04/2022. Stable upon admission to AIR.   - CTM     IDA  Baseline hemoglobin ~9-10. Stable upon admission to AIR at 9.8. Will continue to monitor while admitted.   - M/Th CBC  - Has allergy to formula iron      [name] - Desmoid Tumors  Follows [facility] (Dr. [name]) is patient's outpatient oncologist. Per oncology, most common side effects of the chemotherapy are diarrhea, nausea, headache, and rash. Patient has not had increase in baseline nausea while taking the medication and increased dose as planned on 9/17. Will continue to monitor and discuss with outpatient provider as indicated.   - Nirogacestat 150mg  BID; increased 9/17  - M/Th CMP; monitor LFT     Enterobacteriaceae group bacteremia (Resolved) - C albicans fungemia (Resolved)  Likely due to RIJ tunneled line placed 01/2022 now removed on 8/14.  8/13, 8/17 repeat blood cultures NGTD.  No evidence of vitreitis, retinitis 8/15 per Ophtho.  TTE 8/15 w possible vegetations on MV and TV or indwelling catheter; however, TEE 8/20 w/o vegetations. ID consulted during acute admission and now s/p treatment with cephalosporin and fluconazole on 8/31. Patient noted to have difficult IV access and kept R IJ access upon admission to AIR.   [ ]  Remove R internal jugular access when able to wean off IV meds; will need to remove prior to discharge home     Generalized anxiety disorder, depression, h/o panic attacks  Psychiatry consulted during acute hospitalization with recommendations as below.   - Consider decreasing mirtazapine 15 mg nightly to 7.5 mg nightly due to somnolence  - Valium as above, if needed could give 2.5mg   PRN for acute anxiety  - Neuropsychology consulted; appreciate care     Daily Checklist  - Diet: Regular diet + nighttime tube feeds  - DVT PPX: SQ enoxaparin   - GI PPX: Protonix  - Access: R IJ non tunneled CVC. Necessary for IV medications; will remove when able.    DISPO: Patient to be discussed at weekly interdisciplinary team conference.   - EDD: 12/10/2022  - Follow-up: PCP, Chronic Pain, Oncology (Dr. [name]), Psych    SUBJECTIVE and PROGRESS WITH FUNCTIONAL ACTIVITIES:     Interval Events:   Patient with complaint of some fluid under central line dressing. She commonly gets skin irritation from adhesives. Discussed with nursing, and they are reluctant to change more frequently as it may elevate infection risk. Patient asks that protein supplement be spread out because of resulting diarrhea. Also, complaints of several hot flashes lasting up to 30 minutes--seems worse in last couple days, possibly in response to chemotherapy dose changes. She feels bumex was a little bit effective in urination, but not as strong as IV lasix.     Discussed ongoing risk of infection with central line. Each day conferring higher risk of bloodstream infection. She does not feel as though she can go without IV pain medication and anti-emetics. Agreeable with trying scopolamine patch. She and mother voice understanding of infection risk.    OBJECTIVE:     Vital signs (last 24 hours):  Temp:  [36.6 ??C (97.9 ??F)-36.8 ??C (98.2 ??F)] 36.6 ??C (97.9 ??F)  Heart Rate:  [97-98] 98  Resp:  [18] 18  BP: (96-127)/(60-74) 96/60  MAP (mmHg):  [72-87] 72  SpO2:  [98 %-100 %] 98 %    Intake/Output (last 3 shifts):  I/O last 3 completed shifts:  In: 90 [P.O.:90]  Out: 2900 [Urine:2900]    Physical Exam:   GEN: Sitting comfortably in recliner  EYES: sclera anicteric, conjunctiva clear   HEENT: corpak in place L nare. R I.J. in place (nontunneled)  CV: Warm and well perfused, trace edema in bilateral feet (improved)  RESP:  No increased work of breathing on 2L [place]  ABD: Not distended  SKIN: No visible masses, lesions, rashes, ecchymoses.   MSK: Not visualized today, previously sacrum with some soft tissue swelling near lumbosacral junction.   NEURO: Alert, speech fluent  PSYCH: Mood euthymic, optimistic affect    Medications:  Scheduled    buprenorphine-naloxone (SUBOXONE) 2-0.5 mg SL film 1 mg of buprenorphine TID    carboxymethylcellulose sodium (REFRESH CELLUVISC) 1 % ophthalmic gel 1 drop QID    diazePAM (VALIUM) tablet 2.5 mg Daily    And    diazePAM (VALIUM) tablet 2.5 mg Daily    diazePAM (VALIUM) tablet 5 mg Nightly    diclofenac sodium (VOLTAREN) 1 % gel 2 g QID    enoxaparin (LOVENOX) syringe 40 mg Q24H SCH    famotidine (PEPCID) tablet 20 mg Nightly    furosemide (LASIX) injection 10 mg Once    lidocaine (ASPERCREME) 4 % 2 patch Daily    mirtazapine (REMERON) tablet 15 mg Nightly    multivitamin with folic acid 400 mcg tablet 1 tablet Daily    naloxegol (MOVANTIK) 12.5 mg tablet 12.5 mg Daily    nirogacestat (OGSIVEO) tablet 150 mg BID    pantoprazole (Protonix) EC tablet 40 mg BID    scopolamine (TRANSDERM-SCOP) 1 mg over 3 days topical patch 1 mg Q72H     PRN acetaminophen, 650 mg, Q6H PRN  alum-mag-simeth, 60 mL, Q6H  PRN  baclofen, 10 mg, TID PRN  bisacodyl, 10 mg, BID PRN  butalbital-acetaminophen-caffeine, 1 tablet, BID PRN  carboxymethylcellulose sodium, 2 drop, QID PRN  Chemo Clarification Order, , Continuous PRN  Chemo Clarification Order, , Continuous PRN  diazePAM, 2.5 mg, Nightly PRN  guaiFENesin, 200 mg, Q4H PRN  hydrocortisone, , BID PRN  HYDROmorphone, 0.5 mg, Q6H PRN  IP okay to treat, , Continuous PRN  lidocaine-diphenhydrAMINE-aluminum-magnesium, 10 mL, QID PRN  ondansetron, 4 mg, Q12H PRN  oxyCODONE, 10 mg, Q4H PRN   Or  oxyCODONE, 15 mg, Q4H PRN  phenol, 2 spray, Q2H PRN  polyethylene glycol, 17 g, BID PRN  promethazine, 6.5 mg, Q6H PRN  pseudoephedrine, 30 mg, Daily PRN  senna, 1 tablet, BID PRN  zinc oxide-cod liver oil, , Daily PRN        Labs/Studies: Reviewed.    Radiology Results: Reviewed    Quality Indicators      Hearing, Speech, and Vision  Ability to Hear: Adequate  Ability to See in Adequate Light: Adequate  Expression of Ideas and Wants: Without difficulty  Understanding Verbal and Non-Verbal Content: Understands    Cognitive Pattern Assessment  Cognitive Pattern Assessment Used: BIMS  Brief Interview for Mental Status (BIMS)  Repetition of Three Words (First Attempt): 3  Temporal Orientation: Year: Correct  Temporal Orientation: Month: Accurate within 5 days  Temporal Orientation: Day: Correct  Recall: Sock: Yes, no cue required  Recall: Blue: Yes, no cue required  Recall: Bed: Yes, no cue required  BIMS Summary Score: 15       Nutritional Approaches  Nutritional Approach: Feeding tube    ADLs  Admission Current   Eating Assistance Needed: Set-up / clean-up    CARE Score - 5 Assistance Needed: Set-up / clean-up    CARE Score - 5   Oral Hygiene  Assistance Needed: Set-up / clean-up    CARE Score - 5  Assistance Needed: Set-up / clean-up    CARE Score - 5   Bladder Continence       Bowel Continence       Toileting Hygiene Assistance Needed: Physical assistance Total assistance  CARE Score - 1 Assistance Needed: Physical assistance    CARE Score - 1    Toilet Transfer Assistance Needed: Physical assistance Total assistance  CARE Score - 88 Assistance Needed: Physical assistance    CARE Score - 3   Shower/Bathe Self Assistance Needed: Physical assistance 26%-50%  CARE Score - 88  Assistance Needed: Physical assistance    CARE Score - 3   Upper Body Dressing Assistance Needed: Physical assistance 26%-50%  CARE Score - 3  Assistance Needed: Physical assistance    CARE Score - 3   Lower Body Dressing Assistance Needed: Physical assistance Total assistance  CARE Score - 1  Assistance Needed: Physical assistance    CARE Score - 2   On/Off Footwear Assistance Needed: Physical assistance Total assistance  CARE Score - 1  Assistance Needed: Physical assistance    CARE Score - 1         Transfers Admission Current   Bed to Chair Assistance Needed: Physical assistance Total assistance  CARE Score - 1  Assistance Needed: Physical assistance    CARE Score - 1     Lying to Sitting Assistance Needed: Physical assistance Total assistance  CARE Score - 1  Assistance Needed: Physical assistance    CARE Score - 1   Roll Left/Right Assistance Needed: Physical assistance 76% or more  CARE Score - 2  Assistance Needed: Physical assistance    CARE Score - 2   Sit to Lying Assistance Needed: Physical assistance Total assistance  CARE Score - 1  Assistance Needed: Physical assistance    CARE Score - 1   Sit to Stand Assistance Needed: Physical assistance Total assistance  CARE Score - 1  Assistance Needed: Physical assistance, Adaptive equipment 51%-75%  CARE Score - 2         Mobility Admission Current   Walk 10 Feet Assistance Needed: Physical assistance Total assistance  CARE Score - 1 Assistance Needed: Incidental touching, Adaptive equipment    CARE Score - 4     Walk 50 Feet 2 Turns      CARE Score - 88      CARE Score - 88   Walk 150 Feet      CARE Score - 88       CARE Score - 88   Walk 10 Feet Uneven      CARE Score - 88       CARE Score - 88   1 Step (Curb)      CARE Score - 88       CARE Score - 88   4 Steps      CARE Score - 88       CARE Score - 88   12 Steps      CARE Score - 88       CARE Score - 88   Picking Up Object      CARE Score - 88       CARE Score - 88   Wheelchair/Scooter Use        Wheel 50 Feet 2 Turns Assistance Needed: Physical assistance Total assistance    CARE Score - Wheel 50 Feet with Two Turns: 1    Type of Wheelchair/Scooter: Manual Assistance Needed: Supervision      CARE Score - Wheel 50 Feet with Two Turns: 4    Type of Wheelchair/Scooter: Manual   Wheel 150 Feet Assistance Needed: Physical assistance Total assistance    CARE Score - Wheel 150 Feet: 1    Type of Wheelchair/Scooter: Manual  Assistance Needed: Physical assistance      CARE Score - Wheel 150 Feet: 1    Type of Wheelchair/Scooter: Manual

## 2022-11-28 MED ADMIN — multivitamin with folic acid 400 mcg tablet 1 tablet: 1 | ORAL | @ 13:00:00

## 2022-11-28 MED ADMIN — diazePAM (VALIUM) tablet 5 mg: 5 mg | ORAL | @ 02:00:00

## 2022-11-28 MED ADMIN — nirogacestat (OGSIVEO) tablet 150 mg: 150 mg | ORAL | @ 11:00:00 | Stop: 2022-12-26

## 2022-11-28 MED ADMIN — pantoprazole (Protonix) EC tablet 40 mg: 40 mg | ORAL | @ 13:00:00

## 2022-11-28 MED ADMIN — nirogacestat (OGSIVEO) tablet 150 mg: 150 mg | ORAL | Stop: 2022-12-26

## 2022-11-28 MED ADMIN — nirogacestat (OGSIVEO) tablet 150 mg: 150 mg | ORAL | @ 23:00:00 | Stop: 2022-12-26

## 2022-11-28 MED ADMIN — carboxymethylcellulose sodium (REFRESH CELLUVISC) 1 % ophthalmic gel 1 drop: 1 [drp] | OPHTHALMIC | @ 13:00:00

## 2022-11-28 MED ADMIN — oxyCODONE (ROXICODONE) immediate release tablet 15 mg: 15 mg | ORAL | @ 23:00:00 | Stop: 2022-12-04

## 2022-11-28 MED ADMIN — diazePAM (VALIUM) tablet 2.5 mg: 2.5 mg | ORAL | @ 13:00:00

## 2022-11-28 MED ADMIN — oxyCODONE (ROXICODONE) immediate release tablet 15 mg: 15 mg | ORAL | @ 04:00:00 | Stop: 2022-11-28

## 2022-11-28 MED ADMIN — carboxymethylcellulose sodium (REFRESH CELLUVISC) 1 % ophthalmic gel 1 drop: 1 [drp] | OPHTHALMIC | @ 23:00:00

## 2022-11-28 MED ADMIN — famotidine (PEPCID) tablet 20 mg: 20 mg | ORAL | @ 02:00:00

## 2022-11-28 MED ADMIN — promethazine (PHENERGAN) 6.5 mg in sodium chloride (NS) 0.9 % 50 mL IVPB: 6.5 mg | INTRAVENOUS | @ 02:00:00

## 2022-11-28 MED ADMIN — carboxymethylcellulose sodium (REFRESH CELLUVISC) 1 % ophthalmic gel 1 drop: 1 [drp] | OPHTHALMIC | @ 16:00:00

## 2022-11-28 MED ADMIN — buprenorphine-naloxone (SUBOXONE) 2-0.5 mg SL film 1 mg of buprenorphine: .5 | SUBLINGUAL | @ 13:00:00

## 2022-11-28 MED ADMIN — HYDROmorphone (PF) (DILAUDID) injection 0.5 mg: .5 mg | INTRAVENOUS | @ 13:00:00 | Stop: 2022-11-29

## 2022-11-28 MED ADMIN — buprenorphine-naloxone (SUBOXONE) 2-0.5 mg SL film 1 mg of buprenorphine: .5 | SUBLINGUAL | @ 20:00:00

## 2022-11-28 MED ADMIN — sodium chloride (NS) 0.9 % infusion: 10 mL/h | INTRAVENOUS

## 2022-11-28 MED ADMIN — ondansetron (ZOFRAN) injection 4 mg: 4 mg | INTRAVENOUS | @ 13:00:00

## 2022-11-28 MED ADMIN — pantoprazole (Protonix) EC tablet 40 mg: 40 mg | ORAL | @ 02:00:00

## 2022-11-28 MED ADMIN — furosemide (LASIX) injection 10 mg: 10 mg | INTRAVENOUS | @ 19:00:00 | Stop: 2022-11-28

## 2022-11-28 MED ADMIN — sodium chloride (NS) 0.9 % infusion: 10 mL/h | INTRAVENOUS | @ 02:00:00

## 2022-11-28 MED ADMIN — oxyCODONE (ROXICODONE) immediate release tablet 15 mg: 15 mg | ORAL | @ 16:00:00 | Stop: 2022-12-04

## 2022-11-28 MED ADMIN — HYDROmorphone (PF) (DILAUDID) injection 0.5 mg: .5 mg | INTRAVENOUS | @ 19:00:00 | Stop: 2022-11-29

## 2022-11-28 MED ADMIN — naloxegol (MOVANTIK) 12.5 mg tablet 12.5 mg: 12.5 mg | ORAL | @ 13:00:00

## 2022-11-28 MED ADMIN — promethazine (PHENERGAN) 6.5 mg in sodium chloride (NS) 0.9 % 50 mL IVPB: 6.5 mg | INTRAVENOUS | @ 20:00:00

## 2022-11-28 MED ADMIN — carboxymethylcellulose sodium (REFRESH CELLUVISC) 1 % ophthalmic gel 1 drop: 1 [drp] | OPHTHALMIC | @ 02:00:00

## 2022-11-28 MED ADMIN — buprenorphine-naloxone (SUBOXONE) 2-0.5 mg SL film 1 mg of buprenorphine: .5 | SUBLINGUAL | @ 02:00:00

## 2022-11-28 MED ADMIN — diazePAM (VALIUM) tablet 2.5 mg: 2.5 mg | ORAL | @ 20:00:00

## 2022-11-28 NOTE — Unmapped (Signed)
Patient is alert and oriented x 4.Mother at bedside.Right IJ central line patent with good blood return on 2 lumen;dressing clean , dry and intact. NS continued at 66ml/hr; Oxygen inhalation at 2 LPM.Patient Continues to complain moderate to severe pain and nausea. Tolerates Osmolite 1.5 tube feed at 40 ml/hr; meds given with apple sauce;able to eat few bites for dinner. Out of bed assist x 2-3 with Corene Cornea. Continent on bowel and bladder;Last BM: 9/19. Skin injury prevention;aspiration and fall precautions maintained.  Will continue to monitor.    Problem: Rehabilitation ([facility]) Plan of Care  Goal: Plan of Care Review  Outcome: Progressing  Flowsheets (Taken 11/28/2022 0435)  Plan of Care Reviewed With:   patient   parent  Goal: Patient-Specific Goal (Individualized)  Outcome: Progressing  Goal: Absence of New-Onset Illness or Injury  Outcome: Progressing  Intervention: Prevent Fall and Fall Injury  Recent Flowsheet Documentation  Taken 11/28/2022 0400 by Loann Quill, RN  Safety Interventions:   fall reduction program maintained   family at bedside  Taken 11/28/2022 0200 by Loann Quill, RN  Safety Interventions:   fall reduction program maintained   family at bedside  Taken 11/28/2022 0000 by Loann Quill, RN  Safety Interventions:   fall reduction program maintained   family at bedside  Taken 11/27/2022 2200 by Loann Quill, RN  Safety Interventions:   fall reduction program maintained   family at bedside  Taken 11/27/2022 2000 by Loann Quill, RN  Safety Interventions: fall reduction program maintained  Intervention: Prevent Skin Injury  Recent Flowsheet Documentation  Taken 11/28/2022 0400 by Loann Quill, RN  Device Skin Pressure Protection: absorbent pad utilized/changed  Skin Protection: incontinence pads utilized  Taken 11/28/2022 0200 by Loann Quill, RN  Device Skin Pressure Protection: absorbent pad utilized/changed  Skin Protection: incontinence pads utilized  Taken 11/28/2022 0000 by Loann Quill, RN  Device Skin Pressure Protection:   absorbent pad utilized/changed   adhesive use limited  Skin Protection: incontinence pads utilized  Taken 11/27/2022 2200 by Loann Quill, RN  Device Skin Pressure Protection: absorbent pad utilized/changed  Skin Protection: incontinence pads utilized  Taken 11/27/2022 2000 by Loann Quill, RN  Device Skin Pressure Protection: absorbent pad utilized/changed  Skin Protection: incontinence pads utilized  Intervention: Prevent Infection  Recent Flowsheet Documentation  Taken 11/27/2022 2000 by Loann Quill, RN  Infection Prevention:   environmental surveillance performed   hand hygiene promoted   equipment surfaces disinfected   rest/sleep promoted  Intervention: Prevent VTE (Venous Thromboembolism)  Recent Flowsheet Documentation  Taken 11/28/2022 0020 by Loann Quill, RN  VTE Prevention/Management:   dorsiflexion/plantar flexion performed   fluids promoted  Anti-Embolism Device Type: SCD, Knee  Anti-Embolism Intervention: On  Taken 11/27/2022 2030 by ,  Karlyn Agee, RN  VTE Prevention/Management:   fluids promoted   dorsiflexion/plantar flexion performed  Anti-Embolism Device Type: SCD, Knee  Anti-Embolism Intervention: Off  Goal: Optimal Comfort and Wellbeing  Outcome: Progressing  Goal: Home and Community Transition Plan Established  Outcome: Progressing  Goal: Rounds/Family Conference  Outcome: Progressing     Problem: Self-Care Deficit  Goal: Improved Ability to Complete Activities of Daily Living  Outcome: Progressing  Intervention: Promote Activity and Functional Independence  Recent Flowsheet Documentation  Taken 11/27/2022 2030 by Loann Quill, RN  Self-Care Promotion:   independence encouraged   BADL personal objects within reach   BADL personal routines maintained   meal set-up provided  Problem: Skin Injury Risk Increased  Goal: Skin Health and Integrity  Outcome: Progressing  Intervention: Optimize Skin Protection  Recent Flowsheet Documentation  Taken 11/28/2022 0400 by ,  Karlyn Agee, RN  Pressure Reduction Techniques: frequent weight shift encouraged  Pressure Reduction Devices: positioning supports utilized  Skin Protection: incontinence pads utilized  Taken 11/28/2022 0200 by Loann Quill, RN  Pressure Reduction Techniques: frequent weight shift encouraged  Pressure Reduction Devices: positioning supports utilized  Skin Protection: incontinence pads utilized  Taken 11/28/2022 0000 by Loann Quill, RN  Pressure Reduction Techniques:   frequent weight shift encouraged   weight shift assistance provided  Pressure Reduction Devices: positioning supports utilized  Skin Protection: incontinence pads utilized  Taken 11/27/2022 2200 by ,  Karlyn Agee, RN  Pressure Reduction Techniques: frequent weight shift encouraged  Pressure Reduction Devices: positioning supports utilized  Skin Protection: incontinence pads utilized  Taken 11/27/2022 2000 by Madalyn Rob,  Karlyn Agee, RN  Pressure Reduction Techniques: frequent weight shift encouraged  Pressure Reduction Devices: positioning supports utilized  Skin Protection: incontinence pads utilized  Intervention: Promote and Optimize Oral Intake  Recent Flowsheet Documentation  Taken 11/27/2022 2000 by Loann Quill, RN  Oral Nutrition Promotion:   physical activity promoted   rest periods promoted   calorie-dense foods provided     Problem: Pain Acute  Goal: Optimal Pain Control and Function  Outcome: Progressing     Problem: Fall Injury Risk  Goal: Absence of Fall and Fall-Related Injury  Outcome: Progressing  Intervention: Identify and Manage Contributors  Recent Flowsheet Documentation  Taken 11/27/2022 2030 by Loann Quill, RN  Self-Care Promotion:   independence encouraged   BADL personal objects within reach   BADL personal routines maintained   meal set-up provided  Intervention: Promote Injury-Free Environment  Recent Flowsheet Documentation  Taken 11/28/2022 0400 by Loann Quill, RN  Safety Interventions:   fall reduction program maintained   family at bedside  Taken 11/28/2022 0200 by Loann Quill, RN  Safety Interventions:   fall reduction program maintained   family at bedside  Taken 11/28/2022 0000 by Loann Quill, RN  Safety Interventions:   fall reduction program maintained   family at bedside  Taken 11/27/2022 2200 by Loann Quill, RN  Safety Interventions:   fall reduction program maintained   family at bedside  Taken 11/27/2022 2000 by Loann Quill, RN  Safety Interventions: fall reduction program maintained

## 2022-11-28 NOTE — Unmapped (Signed)
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 Admitted to [facility] on 09/25/2022 for ileus, inability to tolerate PO now on supplemental tube feed, and diffuse pain due to muscle spasms of uncertain etiology.      Rehab Impairment Group Code [facility]): (Debility) 16 Debility (Non-Cardiac/Non-Pulmonary)  Etiology: Diffuse pain due to muscle spasms of uncertain etiology and cutaneous desmoid tumors    PLAN:     This patient is admitted to the Physical Medicine and Rehabilitation - Inpatient - C service from 8am-5pm on weekdays for questions regarding this patient. After hours, weekends, and holidays please contact the 1st Call resident pager     REHAB:   - PT and OT to maximize functional status with mobility and ADLs as well as prevention of joint contracture.   - Neuropsych for higher level cognitive evaluation and coping.  - RT for community re-integration, education, and leisure support services.  - Nutrition consult for diet information/teaching.   - To be discussed in weekly Interdisciplinary Team Conference.     Increased Tonicity  Dystonia   Started post-procedurally per patient ~09/25/22. Occurs intermittently throughout the day with a heightened occurrence during mobilization. At rest patient bilateral ankles in plantarflexion with worsening heel cord tightening. Has tonicity most prominent to HF and KE that is not velocity dependent. Has not noted significant improvement with increase of baclofen to 15 mg TID in setting of infection as below. Neurology previously evaluated patient seemingly ruling out an acute dystonic reaction, cord compression, central process, nutritional deficiency, stiff person syndrome and [name] after normal EMG on 8/22. Continues to be well controlled with valium, small improvement with baclofen PRN.  - Continue Baclofen 10 mg TID prn  - Continued Valium 2.5/2.5/5 mg TID and 2.5 mg nightly PRN  - PRAFO boots in bed     Headache  Currently managed with tylenol. She does have some posterior neck muscle tightness. Notes ketamine is helpful for headache pains. Describes photophobia and phonophobia with events. She does have loss of cervical lordosis on most recent MRI.   - Consider starting propranolol 5 mg BID for headache prophylaxis.   - PRN tylenol +/- prn fiorcet   - Lidocaine cream PRN to area of pain for cervicogenic headaches     Pain  Pain management consulted with recommendations as below. Patient will need to follow up with outpatient chronic pain provider (Dr. Manson [name]).  - Tylenol 650 mg q6h PRN  - CHANGE IV Dilaudid 0.5 mg to q8h PRN; will wean as tolerated.  - Oxyocodone 10-15 mg q4h PRN  - Suboxone 2-0.5 mg TID  - STOP Gabapentin 100 mg TID, 9/17 per patient preference (see below with anasarca related to Lyrica)  - Voltaren gel QID, lidocaine patches  - Continue patellar taping with therapy or obtain patellar brace to help with pain with ambulation  - Continue Movantik 12.5mg  daily; discontinue upon discharge from [facility]    Anasarca  Patient noted increased lower extremity, facial, and abdominal swelling prior to admission to [facility]. Reported that pitting edema was present when taking Lyrica. No current medications with significant side effects of swelling. Requested IV Lasix as this was helpful in the past. Noted that some edema could be related to gabapentin, will plan to monitor and discontinue as  indicated. On exam, patient with mild edema in bilateral feet, not appreciated elsewhere on exam. Noted improvement in edema with IV Lasix. Will continue to monitor daily and dose as needed. Oral Bumex trial 9/18 modestly helpful.    - Bumex 0.5mg  x1 9/18. Lasix IV 10mg  x1 9/19. Lasix IV 10mg  x1 9/20  - Daily standing weight  - Increase protein intake per dietician  - STOP gabapentin 9/17; can consider restarting if no change in edema     Multifactorial Ileus   Nausea (chronic)  [place] Emesis 2/2 NGT placement (resolved)  Contributors with history of narcotics, poor PO intake. Concern for malnutrition. Improvements to abdominal pain since weaning opioids. TPN has been discontinued with patient now tolerating tube feeds. Patient with chronic nausea that was previously managed with Zofran, though transitioned to phenergan due to prolonged Qtc. Upon admission to [facility], continued nightly tube feeds with Corpak NG tube in place, and PO intake as tolerated during the day. Will continue to decrease tube feeds with goal of discharge from [facility] on full regular diet. If unable to tolerate full PO diet, will consider discharge home with corpak. Patient noted weight gain upon arrival to [facility] (felt likely fluid retention) as weight was increased in a short period of time.   - Dietician following; appreciate care and recommendations  - Continue nightly tube feeds  - Continue PO regular diet + protein supplementation; anticipate NG tube removal prior to discharge   - Calorie count  - Daily standing weights   - Continue Protonix 40mg  BID  - Continue IV phenergan 6.5mg  Q6 PRN; Scopolamine patch ordered 9/19 in hopes of transitioning off IV  [ ]  restart home cefdinir and doxycycline upon discharge from [facility] for SIBP     Syncope  Multifactorial. Primary team ddx includes: vasovagal vs adverse effect of medications vs deconditioning vs iron deficiency. Last echo 04/2022. Stable upon admission to [facility].   - CTM     IDA  Baseline hemoglobin ~9-10. Stable upon admission to AIR at 9.8. Will continue to monitor while admitted.   - M/Th CBC  - Has allergy to formula iron      [name] - Desmoid Tumors  Follows [name] (Dr. Meredith [name]) is patient's outpatient oncologist. Per oncology, most common side effects of the chemotherapy are diarrhea, nausea, headache, and rash. Patient has not had increase in baseline nausea while taking the medication and increased dose as planned on 9/17. Will continue to monitor and discuss with outpatient provider as indicated.   - Nirogacestat 150mg  BID; increased 9/17  - M/Th CMP; monitor LFT     Enterobacteriaceae group bacteremia (Resolved) - C albicans fungemia (Resolved)  Likely due to RIJ tunneled line placed 01/2022 now removed on 8/14.  8/13, 8/17 repeat blood cultures NGTD.  No evidence of vitreitis, retinitis 8/15 per Ophtho.  TTE 8/15 w possible vegetations on MV and TV or indwelling catheter; however, TEE 8/20 w/o vegetations. ID consulted during acute admission and now s/p treatment with cephalosporin and fluconazole on 8/31. Patient noted to have difficult IV access and kept R IJ access upon admission to [facility].   [ ]  Remove R internal jugular access when able to wean off IV meds; will need to remove prior to discharge home     Generalized anxiety disorder, depression, h/o panic attacks  Psychiatry consulted during acute hospitalization with recommendations as below.   - Consider decreasing mirtazapine 15 mg nightly to 7.5 mg nightly due to somnolence  - Valium  as above, if needed could give 2.5mg  PRN for acute anxiety  - Neuropsychology consulted; appreciate care     Daily Checklist  - Diet: Regular diet + nighttime tube feeds  - DVT PPX: SQ enoxaparin   - GI PPX: Protonix  - Access: R IJ non tunneled CVC. Necessary for IV medications; will remove when able.    DISPO: Patient to be discussed at weekly interdisciplinary team conference.   - EDD: 12/10/2022  - Follow-up: PCP, Chronic Pain, Oncology (Dr. Meredith [name]), Psych    SUBJECTIVE and PROGRESS WITH FUNCTIONAL ACTIVITIES:     Interval Events:   NAEON. Patient noted that she didn't get the protein packets yesterday, discussed with dietary and changed the order to be with meals rather than all at night. Noted nausea and pain controlled on current regimen. Will give one dose of Lasix today and observe throughout the weekend. Discussed in interdisciplinary team conference and patient is making good physical progress.     OBJECTIVE:     Vital signs (last 24 hours):  Temp:  [36.6 ??C (97.9 ??F)-36.8 ??C (98.2 ??F)] 36.6 ??C (97.9 ??F)  Heart Rate:  [84-98] 84  Resp:  [18] 18  BP: (97-121)/(52-71) 97/52  MAP (mmHg):  [67-85] 67  SpO2:  [100 %] 100 %    Intake/Output (last 3 shifts):  I/O last 3 completed shifts:  In: 480 [I.V.:30; NG/GT:450]  Out: 1550 [Urine:1550]    Physical Exam:   GEN: [place] herself to the therapy gym with PT  EYES: sclera anicteric, conjunctiva clear   HEENT: corpak in place L nare. R I.J. in place (nontunneled)  CV: Warm and well perfused, trace edema in bilateral feet (improved)  RESP:  No increased work of breathing on 2L [place]  ABD: Not distended  SKIN: No visible masses, lesions, rashes, ecchymoses.   MSK: Not visualized today, previously sacrum with some soft tissue swelling near lumbosacral junction.   NEURO: Alert, speech fluent  PSYCH: Cheerful    Medications:  Scheduled    buprenorphine-naloxone (SUBOXONE) 2-0.5 mg SL film 1 mg of buprenorphine TID    carboxymethylcellulose sodium (REFRESH CELLUVISC) 1 % ophthalmic gel 1 drop QID    diazePAM (VALIUM) tablet 2.5 mg Daily    And    diazePAM (VALIUM) tablet 2.5 mg Daily    diazePAM (VALIUM) tablet 5 mg Nightly    diclofenac sodium (VOLTAREN) 1 % gel 2 g QID    enoxaparin (LOVENOX) syringe 40 mg Q24H SCH    famotidine (PEPCID) tablet 20 mg Nightly    lidocaine (ASPERCREME) 4 % 2 patch Daily    mirtazapine (REMERON) tablet 15 mg Nightly    multivitamin with folic acid 400 mcg tablet 1 tablet Daily    naloxegol (MOVANTIK) 12.5 mg tablet 12.5 mg Daily    nirogacestat (OGSIVEO) tablet 150 mg BID    pantoprazole (Protonix) EC tablet 40 mg BID    scopolamine (TRANSDERM-SCOP) 1 mg over 3 days topical patch 1 mg Q72H     PRN acetaminophen, 650 mg, Q6H PRN  alum-mag-simeth, 60 mL, Q6H PRN  baclofen, 10 mg, TID PRN  bisacodyl, 10 mg, BID PRN  butalbital-acetaminophen-caffeine, 1 tablet, BID PRN  carboxymethylcellulose sodium, 2 drop, QID PRN  Chemo Clarification Order, , Continuous PRN  Chemo Clarification Order, , Continuous PRN  diazePAM, 2.5 mg, Nightly PRN  guaiFENesin, 200 mg, Q4H PRN  hydrocortisone, , BID PRN  HYDROmorphone, 0.5 mg, Q6H PRN  IP okay to treat, , Continuous PRN  lidocaine-diphenhydrAMINE-aluminum-magnesium, 10 mL,  QID PRN  ondansetron, 4 mg, Q12H PRN  oxyCODONE, 10 mg, Q4H PRN   Or  oxyCODONE, 15 mg, Q4H PRN  phenol, 2 spray, Q2H PRN  polyethylene glycol, 17 g, BID PRN  promethazine, 6.5 mg, Q6H PRN  pseudoephedrine, 30 mg, Daily PRN  senna, 1 tablet, BID PRN  zinc oxide-cod liver oil, , Daily PRN        Labs/Studies: Reviewed.    Radiology Results: Reviewed    Quality Indicators      Hearing, Speech, and Vision  Ability to Hear: Adequate  Ability to See in Adequate Light: Adequate  Expression of Ideas and Wants: Without difficulty  Understanding Verbal and Non-Verbal Content: Understands    Cognitive Pattern Assessment  Cognitive Pattern Assessment Used: BIMS  Brief Interview for Mental Status (BIMS)  Repetition of Three Words (First Attempt): 3  Temporal Orientation: Year: Correct  Temporal Orientation: Month: Accurate within 5 days  Temporal Orientation: Day: Correct  Recall: Sock: Yes, no cue required  Recall: Blue: Yes, no cue required  Recall: Bed: Yes, no cue required  BIMS Summary Score: 15       Nutritional Approaches  Nutritional Approach: Feeding tube    ADLs  Admission Current   Eating Assistance Needed: Set-up / clean-up    CARE Score - 5 Assistance Needed: Set-up / clean-up    CARE Score - 5   Oral Hygiene  Assistance Needed: Set-up / clean-up    CARE Score - 5  Assistance Needed: Set-up / clean-up    CARE Score - 5   Bladder Continence       Bowel Continence       Toileting Hygiene Assistance Needed: Physical assistance Total assistance  CARE Score - 1 Assistance Needed: Physical assistance    CARE Score - 1    Toilet Transfer Assistance Needed: Physical assistance Total assistance  CARE Score - 88 Assistance Needed: Physical assistance    CARE Score - 3   Shower/Bathe Self Assistance Needed: Physical assistance 26%-50%  CARE Score - 88  Assistance Needed: Physical assistance    CARE Score - 3   Upper Body Dressing Assistance Needed: Physical assistance 26%-50%  CARE Score - 3  Assistance Needed: Physical assistance    CARE Score - 3   Lower Body Dressing Assistance Needed: Physical assistance Total assistance  CARE Score - 1  Assistance Needed: Physical assistance    CARE Score - 2   On/Off Footwear Assistance Needed: Physical assistance Total assistance  CARE Score - 1  Assistance Needed: Physical assistance    CARE Score - 1         Transfers Admission Current   Bed to Chair Assistance Needed: Physical assistance Total assistance  CARE Score - 1  Assistance Needed: Physical assistance 25% or less  CARE Score - 3     Lying to Sitting Assistance Needed: Physical assistance Total assistance  CARE Score - 1  Assistance Needed: Physical assistance 25% or less  CARE Score - 3   Roll Left/Right Assistance Needed: Physical assistance 76% or more  CARE Score - 2  Assistance Needed: Physical assistance 25% or less  CARE Score - 3   Sit to Lying Assistance Needed: Physical assistance Total assistance  CARE Score - 1  Assistance Needed: Physical assistance 25% or less  CARE Score - 3   Sit to Stand Assistance Needed: Physical assistance Total assistance  CARE Score - 1  Assistance Needed: Physical assistance 25% or less  CARE Score -  3         Mobility Admission Current   Walk 10 Feet Assistance Needed: Physical assistance Total assistance  CARE Score - 1 Assistance Needed: Physical assistance 25% or less  CARE Score - 3     Walk 50 Feet 2 Turns Assistance Needed: Physical assistance 25% or less  CARE Score - 88 Assistance Needed: Physical assistance 25% or less  CARE Score - 3   Walk 150 Feet Assistance Needed: Physical assistance 25% or less  CARE Score - 88  Assistance Needed: Physical assistance 25% or less  CARE Score - 3   Walk 10 Feet Uneven Assistance Needed: Physical assistance 25% or less  CARE Score - 88  Assistance Needed: Physical assistance 25% or less  CARE Score - 3   1 Step (Curb)      CARE Score - 88       CARE Score - 88   4 Steps      CARE Score - 88       CARE Score - 88   12 Steps      CARE Score - 88       CARE Score - 88   Picking Up Object      CARE Score - 88       CARE Score - 88   Wheelchair/Scooter Use        Wheel 50 Feet 2 Turns Assistance Needed: Physical assistance Total assistance    CARE Score - Wheel 50 Feet with Two Turns: 1    Type of Wheelchair/Scooter: Manual Assistance Needed: Supervision      CARE Score - Wheel 50 Feet with Two Turns: 4    Type of Wheelchair/Scooter: Manual   Wheel 150 Feet Assistance Needed: Physical assistance Total assistance    CARE Score - Wheel 150 Feet: 1    Type of Wheelchair/Scooter: Manual  Assistance Needed: Physical assistance 25% or less    CARE Score - Wheel 150 Feet: 3    Type of Wheelchair/Scooter: Manual

## 2022-11-29 MED ADMIN — buprenorphine-naloxone (SUBOXONE) 2-0.5 mg SL film 1 mg of buprenorphine: .5 | SUBLINGUAL | @ 14:00:00

## 2022-11-29 MED ADMIN — ondansetron (ZOFRAN) injection 4 mg: 4 mg | INTRAVENOUS | @ 13:00:00

## 2022-11-29 MED ADMIN — famotidine (PEPCID) tablet 20 mg: 20 mg | ORAL | @ 02:00:00

## 2022-11-29 MED ADMIN — buprenorphine-naloxone (SUBOXONE) 2-0.5 mg SL film 1 mg of buprenorphine: .5 | SUBLINGUAL | @ 18:00:00

## 2022-11-29 MED ADMIN — pantoprazole (Protonix) EC tablet 40 mg: 40 mg | ORAL | @ 02:00:00

## 2022-11-29 MED ADMIN — carboxymethylcellulose sodium (REFRESH CELLUVISC) 1 % ophthalmic gel 1 drop: 1 [drp] | OPHTHALMIC | @ 22:00:00

## 2022-11-29 MED ADMIN — HYDROmorphone (PF) (DILAUDID) injection 0.5 mg: .5 mg | INTRAVENOUS | @ 22:00:00 | Stop: 2022-12-13

## 2022-11-29 MED ADMIN — diazePAM (VALIUM) tablet 2.5 mg: 2.5 mg | ORAL | @ 18:00:00

## 2022-11-29 MED ADMIN — ondansetron (ZOFRAN) injection 4 mg: 4 mg | INTRAVENOUS | @ 01:00:00

## 2022-11-29 MED ADMIN — buprenorphine-naloxone (SUBOXONE) 2-0.5 mg SL film 1 mg of buprenorphine: .5 | SUBLINGUAL | @ 02:00:00

## 2022-11-29 MED ADMIN — carboxymethylcellulose sodium (REFRESH CELLUVISC) 1 % ophthalmic gel 1 drop: 1 [drp] | OPHTHALMIC | @ 02:00:00

## 2022-11-29 MED ADMIN — oxyCODONE (ROXICODONE) immediate release tablet 15 mg: 15 mg | ORAL | @ 17:00:00 | Stop: 2022-12-04

## 2022-11-29 MED ADMIN — HYDROmorphone (PF) (DILAUDID) injection 0.5 mg: .5 mg | INTRAVENOUS | @ 02:00:00 | Stop: 2022-11-29

## 2022-11-29 MED ADMIN — nirogacestat (OGSIVEO) tablet 150 mg: 150 mg | ORAL | @ 22:00:00 | Stop: 2022-12-26

## 2022-11-29 MED ADMIN — pantoprazole (Protonix) EC tablet 40 mg: 40 mg | ORAL | @ 14:00:00

## 2022-11-29 MED ADMIN — carboxymethylcellulose sodium (REFRESH CELLUVISC) 1 % ophthalmic gel 1 drop: 1 [drp] | OPHTHALMIC | @ 17:00:00

## 2022-11-29 MED ADMIN — diazePAM (VALIUM) tablet 2.5 mg: 2.5 mg | ORAL | @ 14:00:00

## 2022-11-29 MED ADMIN — carboxymethylcellulose sodium (REFRESH CELLUVISC) 1 % ophthalmic gel 1 drop: 1 [drp] | OPHTHALMIC | @ 14:00:00

## 2022-11-29 MED ADMIN — multivitamin with folic acid 400 mcg tablet 1 tablet: 1 | ORAL | @ 14:00:00

## 2022-11-29 MED ADMIN — diazePAM (VALIUM) tablet 5 mg: 5 mg | ORAL | @ 02:00:00

## 2022-11-29 MED ADMIN — naloxegol (MOVANTIK) 12.5 mg tablet 12.5 mg: 12.5 mg | ORAL | @ 14:00:00

## 2022-11-29 MED ADMIN — HYDROmorphone (PF) (DILAUDID) injection 0.5 mg: .5 mg | INTRAVENOUS | @ 13:00:00 | Stop: 2022-12-13

## 2022-11-29 MED ADMIN — nirogacestat (OGSIVEO) tablet 150 mg: 150 mg | ORAL | @ 11:00:00 | Stop: 2022-12-26

## 2022-11-29 MED ADMIN — promethazine (PHENERGAN) 6.5 mg in sodium chloride (NS) 0.9 % 50 mL IVPB: 6.5 mg | INTRAVENOUS | @ 19:00:00

## 2022-11-29 NOTE — Unmapped (Signed)
**Note De-Identified  Obfuscation** No falls or injuries this shift. Frequent requests for pain and nausea medications. Central line dressing changed; skin around insertion site macerated and painful. See nursing note. Transfers with Corene Cornea and two people assisting. Family at bedside. All needs attended; will continue to monitor.

## 2022-11-29 NOTE — Unmapped (Signed)
Patient is alert and oriented x 4 , medicated for pain x 1 and Zofran given for nausea ,continent x 2 ,tube feeding in progress ,tolerating well   ,Central line dressing is intact , safety and fall precautions maintained ,call bell and personnel belongings are at bed side ,bed in lowest position, mom at bed side.            Problem: Rehabilitation ([facility]) Plan of Care  Goal: Plan of Care Review  Outcome: Progressing  Goal: Patient-Specific Goal (Individualized)  Outcome: Progressing  Goal: Absence of New-Onset Illness or Injury  Outcome: Progressing  Intervention: Prevent Fall and Fall Injury  Recent Flowsheet Documentation  Taken 11/29/2022 0200 by Orland Dec, RN  Safety Interventions: fall reduction program maintained  Taken 11/29/2022 0000 by Orland Dec, RN  Safety Interventions: fall reduction program maintained  Taken 11/28/2022 2200 by Orland Dec, RN  Safety Interventions: fall reduction program maintained  Taken 11/28/2022 2000 by Orland Dec, RN  Safety Interventions: fall reduction program maintained  Intervention: Prevent Skin Injury  Recent Flowsheet Documentation  Taken 11/28/2022 2000 by Orland Dec, RN  Skin Protection: adhesive use limited  Intervention: Prevent Infection  Recent Flowsheet Documentation  Taken 11/28/2022 2000 by Orland Dec, RN  Infection Prevention: hand hygiene promoted  Goal: Optimal Comfort and Wellbeing  Outcome: Progressing  Goal: Home and Community Transition Plan Established  Outcome: Progressing  Goal: Rounds/Family Conference  Outcome: Progressing     Problem: Self-Care Deficit  Goal: Improved Ability to Complete Activities of Daily Living  Outcome: Progressing  Intervention: Promote Activity and Functional Independence  Recent Flowsheet Documentation  Taken 11/28/2022 2230 by Orland Dec, RN  Self-Care Promotion: independence encouraged     Problem: Skin Injury Risk Increased  Goal: Skin Health and Integrity  Outcome: Progressing  Intervention: Optimize Skin Protection  Recent Flowsheet Documentation  Taken 11/29/2022 0200 by Orland Dec, RN  Pressure Reduction Techniques: frequent weight shift encouraged  Pressure Reduction Devices: positioning supports utilized  Taken 11/29/2022 0000 by Orland Dec, RN  Pressure Reduction Techniques: frequent weight shift encouraged  Pressure Reduction Devices: positioning supports utilized  Taken 11/28/2022 2200 by Orland Dec, RN  Pressure Reduction Techniques: frequent weight shift encouraged  Pressure Reduction Devices: positioning supports utilized  Taken 11/28/2022 2000 by Orland Dec, RN  Pressure Reduction Techniques: frequent weight shift encouraged  Head of Bed (HOB) Positioning: HOB elevated  Pressure Reduction Devices: positioning supports utilized  Skin Protection: adhesive use limited     Problem: Pain Acute  Goal: Optimal Pain Control and Function  Outcome: Progressing     Problem: Fall Injury Risk  Goal: Absence of Fall and Fall-Related Injury  Outcome: Progressing  Intervention: Identify and Manage Contributors  Recent Flowsheet Documentation  Taken 11/28/2022 2230 by Orland Dec, RN  Self-Care Promotion: independence encouraged  Intervention: Promote Injury-Free Environment  Recent Flowsheet Documentation  Taken 11/29/2022 0200 by Orland Dec, RN  Safety Interventions: fall reduction program maintained  Taken 11/29/2022 0000 by Orland Dec, RN  Safety Interventions: fall reduction program maintained  Taken 11/28/2022 2200 by Orland Dec, RN  Safety Interventions: fall reduction program maintained  Taken 11/28/2022 2000 by Orland Dec, RN  Safety Interventions: fall reduction program maintained

## 2022-11-29 NOTE — Unmapped (Signed)
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 Admitted to [facility] on 09/25/2022 for ileus, inability to tolerate PO now on supplemental tube feed, and diffuse pain due to muscle spasms of uncertain etiology.      Rehab Impairment Group Code [facility]): (Debility) 16 Debility (Non-Cardiac/Non-Pulmonary)  Etiology: Diffuse pain due to muscle spasms of uncertain etiology and cutaneous desmoid tumors    PLAN:     This patient is admitted to the Physical Medicine and Rehabilitation - Inpatient - C service from 8am-5pm on weekdays for questions regarding this patient. After hours, weekends, and holidays please contact the 1st Call resident pager     REHAB:   - PT and OT to maximize functional status with mobility and ADLs as well as prevention of joint contracture.   - Neuropsych for higher level cognitive evaluation and coping.  - RT for community re-integration, education, and leisure support services.  - Nutrition consult for diet information/teaching.   - To be discussed in weekly Interdisciplinary Team Conference.     Increased Tonicity  Dystonia   Started post-procedurally per patient ~09/25/22. Occurs intermittently throughout the day with a heightened occurrence during mobilization. At rest patient bilateral ankles in plantarflexion with worsening heel cord tightening. Has tonicity most prominent to HF and KE that is not velocity dependent. Has not noted significant improvement with increase of baclofen to 15 mg TID in setting of infection as below. Neurology previously evaluated patient seemingly ruling out an acute dystonic reaction, cord compression, central process, nutritional deficiency, stiff person syndrome and [name] after normal EMG on 8/22. Continues to be well controlled with valium, small improvement with baclofen PRN.  - Continue Baclofen 10 mg TID prn  - Continued Valium 2.5/2.5/5 mg TID and 2.5 mg nightly PRN  - PRAFO boots in bed     Headache  Currently managed with tylenol. She does have some posterior neck muscle tightness. Notes ketamine is helpful for headache pains. Describes photophobia and phonophobia with events. She does have loss of cervical lordosis on most recent MRI.   - Consider starting propranolol 5 mg BID for headache prophylaxis.   - PRN tylenol +/- prn fiorcet   - Lidocaine cream PRN to area of pain for cervicogenic headaches     Pain  Pain management consulted with recommendations as below. Patient will need to follow up with outpatient chronic pain provider ([name]).  - Tylenol 650 mg q6h PRN  - CHANGE IV Dilaudid 0.5 mg to q8h PRN; will wean as tolerated.  - Oxyocodone 10-15 mg q4h PRN  - Suboxone 2-0.5 mg TID  - STOP Gabapentin 100 mg TID, 9/17 per patient preference (see below with anasarca related to Lyrica)  - Voltaren gel QID, lidocaine patches  - Continue patellar taping with therapy or obtain patellar brace to help with pain with ambulation  - Continue Movantik 12.5mg  daily; discontinue upon discharge from [facility]    Anasarca  Patient noted increased lower extremity, facial, and abdominal swelling prior to admission to AIR. Reported that pitting edema was present when taking Lyrica. No current medications with significant side effects of swelling. Requested IV Lasix as this was helpful in the past. Noted that some edema could be related to gabapentin, will plan to monitor and discontinue as  indicated. On exam, patient with mild edema in bilateral feet, not appreciated elsewhere on exam. Noted improvement in edema with IV Lasix. Will continue to monitor daily and dose as needed. Oral Bumex trial 9/18 modestly helpful.    - Bumex 0.5mg  x1 9/18. Lasix IV 10mg  x1 9/19. Lasix IV 10mg  x1 9/20  - Daily standing weight  - Increase protein intake per dietician  - STOP gabapentin 9/17; can consider restarting if no change in edema     Multifactorial Ileus   Nausea (chronic)  [place] Emesis 2/2 NGT placement (resolved)  Contributors with history of narcotics, poor PO intake. Concern for malnutrition. Improvements to abdominal pain since weaning opioids. TPN has been discontinued with patient now tolerating tube feeds. Patient with chronic nausea that was previously managed with Zofran, though transitioned to phenergan due to prolonged Qtc. Upon admission to AIR, continued nightly tube feeds with Corpak NG tube in place, and PO intake as tolerated during the day. Will continue to decrease tube feeds with goal of discharge from [facility] on full regular diet. If unable to tolerate full PO diet, will consider discharge home with corpak. Patient noted weight gain upon arrival to AIR (felt likely fluid retention) as weight was increased in a short period of time.   - Dietician following; appreciate care and recommendations  - Continue nightly tube feeds  - Continue PO regular diet + protein supplementation; anticipate NG tube removal prior to discharge   - Calorie count  - Daily standing weights   - Continue Protonix 40mg  BID  - Continue IV phenergan 6.5mg  Q6 PRN; Scopolamine patch ordered 9/19 in hopes of transitioning off IV  [ ]  restart home cefdinir and doxycycline upon discharge from AIR for SIBP     Syncope  Multifactorial. Primary team ddx includes: vasovagal vs adverse effect of medications vs deconditioning vs iron deficiency. Last echo 04/2022. Stable upon admission to AIR.   - CTM     IDA  Baseline hemoglobin ~9-10. Stable upon admission to AIR at 9.8. Will continue to monitor while admitted.   - M/Th CBC  - Has allergy to formula iron      [name] - Desmoid Tumors  Follows [facility] (Dr. [name]) is patient's outpatient oncologist. Per oncology, most common side effects of the chemotherapy are diarrhea, nausea, headache, and rash. Patient has not had increase in baseline nausea while taking the medication and increased dose as planned on 9/17. Will continue to monitor and discuss with outpatient provider as indicated.   - Nirogacestat 150mg  BID; increased 9/17  - M/Th CMP; monitor LFT     Enterobacteriaceae group bacteremia (Resolved) - C albicans fungemia (Resolved)  Likely due to RIJ tunneled line placed 01/2022 now removed on 8/14.  8/13, 8/17 repeat blood cultures NGTD.  No evidence of vitreitis, retinitis 8/15 per Ophtho.  TTE 8/15 w possible vegetations on MV and TV or indwelling catheter; however, TEE 8/20 w/o vegetations. ID consulted during acute admission and now s/p treatment with cephalosporin and fluconazole on 8/31. Patient noted to have difficult IV access and kept R IJ access upon admission to AIR.   [ ]  Remove R internal jugular access when able to wean off IV meds; will need to remove prior to discharge home     Generalized anxiety disorder, depression, h/o panic attacks  Psychiatry consulted during acute hospitalization with recommendations as below.   - Consider decreasing mirtazapine 15 mg nightly to 7.5 mg nightly due to somnolence  - Valium  as above, if needed could give 2.5mg  PRN for acute anxiety  - Neuropsychology consulted; appreciate care     Daily Checklist  - Diet: Regular diet + nighttime tube feeds  - DVT PPX: SQ enoxaparin   - GI PPX: Protonix  - Access: R IJ non tunneled CVC. Necessary for IV medications; will remove when able.    DISPO: Patient to be discussed at weekly interdisciplinary team conference.   - EDD: 12/10/2022  - Follow-up: PCP, Chronic Pain, Oncology (Dr. [name]), Psych    SUBJECTIVE and PROGRESS WITH FUNCTIONAL ACTIVITIES:     Weekend events:  9/21: Patient with itching and blistering at internal jugular site. She is requesting IV benadryl but was agreeable to oral benadryl to help with the itching. Also requesting prosource packets as she hasn't gotten them for the last 2 days. Will hold lovenox at this time as patient states frustration with being asked about lovenox.      OBJECTIVE:     Vital signs (last 24 hours):  Temp:  [36.6 ??C (97.9 ??F)] 36.6 ??C (97.9 ??F)  Heart Rate:  [93-102] 102  Resp:  [18] 18  BP: (99-125)/(57-77) 99/57  MAP (mmHg):  [70-90] 70  SpO2:  [98 %-100 %] 100 %    Intake/Output (last 3 shifts):  I/O last 3 completed shifts:  In: 660 [I.V.:30; NG/GT:630]  Out: 2200 [Urine:2200]    Physical Exam:   GEN: Sitting in wheelchair  EYES: sclera anicteric, conjunctiva clear   HEENT: corpak in place L nare. R I.J. in place (nontunneled)  CV: Warm and well perfused, trace edema in bilateral feet (improved)  RESP:  No increased work of breathing on 2L [place]  ABD: Not distended  SKIN: No visible masses, lesions, rashes, ecchymoses.   MSK: Not visualized today, previously sacrum with some soft tissue swelling near lumbosacral junction.   NEURO: Alert, speech fluent  PSYCH: Cheerful    Medications:  Scheduled    buprenorphine-naloxone (SUBOXONE) 2-0.5 mg SL film 1 mg of buprenorphine TID    carboxymethylcellulose sodium (REFRESH CELLUVISC) 1 % ophthalmic gel 1 drop QID    diazePAM (VALIUM) tablet 2.5 mg Daily    And    diazePAM (VALIUM) tablet 2.5 mg Daily    diazePAM (VALIUM) tablet 5 mg Nightly    diclofenac sodium (VOLTAREN) 1 % gel 2 g QID    enoxaparin (LOVENOX) syringe 40 mg Q24H SCH    famotidine (PEPCID) tablet 20 mg Nightly    lidocaine (ASPERCREME) 4 % 2 patch Daily    mirtazapine (REMERON) tablet 15 mg Nightly    multivitamin with folic acid 400 mcg tablet 1 tablet Daily    naloxegol (MOVANTIK) 12.5 mg tablet 12.5 mg Daily    nirogacestat (OGSIVEO) tablet 150 mg BID    pantoprazole (Protonix) EC tablet 40 mg BID    scopolamine (TRANSDERM-SCOP) 1 mg over 3 days topical patch 1 mg Q72H     PRN acetaminophen, 650 mg, Q6H PRN  alum-mag-simeth, 60 mL, Q6H PRN  baclofen, 10 mg, TID PRN  bisacodyl, 10 mg, BID PRN  butalbital-acetaminophen-caffeine, 1 tablet, BID PRN  carboxymethylcellulose sodium, 2 drop, QID PRN  Chemo Clarification Order, , Continuous PRN  Chemo Clarification Order, , Continuous PRN  diazePAM, 2.5 mg, Nightly PRN  diphenhydrAMINE, 25 mg, Q6H PRN  guaiFENesin, 200 mg, Q4H PRN  hydrocortisone, , BID PRN  HYDROmorphone, 0.5 mg, Q8H PRN  IP okay to treat, , Continuous PRN  lidocaine-diphenhydrAMINE-aluminum-magnesium, 10 mL, QID PRN  ondansetron, 4  mg, Q12H PRN  oxyCODONE, 10 mg, Q4H PRN   Or  oxyCODONE, 15 mg, Q4H PRN  phenol, 2 spray, Q2H PRN  polyethylene glycol, 17 g, BID PRN  promethazine, 6.5 mg, Q6H PRN  pseudoephedrine, 30 mg, Daily PRN  senna, 1 tablet, BID PRN  zinc oxide-cod liver oil, , Daily PRN        Labs/Studies: Reviewed.    Radiology Results: Reviewed    Quality Indicators      Hearing, Speech, and Vision  Ability to Hear: Adequate  Ability to See in Adequate Light: Adequate  Expression of Ideas and Wants: Without difficulty  Understanding Verbal and Non-Verbal Content: Understands    Cognitive Pattern Assessment  Cognitive Pattern Assessment Used: BIMS  Brief Interview for Mental Status (BIMS)  Repetition of Three Words (First Attempt): 3  Temporal Orientation: Year: Correct  Temporal Orientation: Month: Accurate within 5 days  Temporal Orientation: Day: Correct  Recall: Sock: Yes, no cue required  Recall: Blue: Yes, no cue required  Recall: Bed: Yes, no cue required  BIMS Summary Score: 15       Nutritional Approaches  Nutritional Approach: Feeding tube    ADLs  Admission Current   Eating Assistance Needed: Set-up / clean-up    CARE Score - 5 Assistance Needed: Set-up / clean-up    CARE Score - 5   Oral Hygiene  Assistance Needed: Set-up / clean-up    CARE Score - 5  Assistance Needed: Set-up / clean-up    CARE Score - 5   Bladder Continence       Bowel Continence       Toileting Hygiene Assistance Needed: Physical assistance Total assistance  CARE Score - 1 Assistance Needed: Physical assistance    CARE Score - 1    Toilet Transfer Assistance Needed: Physical assistance Total assistance  CARE Score - 88 Assistance Needed: Physical assistance    CARE Score - 3   Shower/Bathe Self Assistance Needed: Physical assistance 26%-50%  CARE Score - 88  Assistance Needed: Physical assistance 26%-50%  CARE Score - 3   Upper Body Dressing Assistance Needed: Physical assistance 26%-50%  CARE Score - 3  Assistance Needed: Set-up / clean-up    CARE Score - 5   Lower Body Dressing Assistance Needed: Physical assistance Total assistance  CARE Score - 1  Assistance Needed: Physical assistance Total assistance  CARE Score - 1   On/Off Footwear Assistance Needed: Physical assistance Total assistance  CARE Score - 1  Assistance Needed: Physical assistance    CARE Score - 1         Transfers Admission Current   Bed to Chair Assistance Needed: Physical assistance Total assistance  CARE Score - 1  Assistance Needed: Physical assistance 25% or less  CARE Score - 3     Lying to Sitting Assistance Needed: Physical assistance Total assistance  CARE Score - 1  Assistance Needed: Physical assistance 25% or less  CARE Score - 3   Roll Left/Right Assistance Needed: Physical assistance 76% or more  CARE Score - 2  Assistance Needed: Physical assistance 25% or less  CARE Score - 3   Sit to Lying Assistance Needed: Physical assistance Total assistance  CARE Score - 1  Assistance Needed: Physical assistance 25% or less  CARE Score - 3   Sit to Stand Assistance Needed: Physical assistance Total assistance  CARE Score - 1  Assistance Needed: Physical assistance 25% or less  CARE Score - 3  Mobility Admission Current   Walk 10 Feet Assistance Needed: Physical assistance Total assistance  CARE Score - 1 Assistance Needed: Physical assistance 25% or less  CARE Score - 3     Walk 50 Feet 2 Turns Assistance Needed: Physical assistance 25% or less  CARE Score - 88 Assistance Needed: Physical assistance 25% or less  CARE Score - 3   Walk 150 Feet Assistance Needed: Physical assistance 25% or less  CARE Score - 88  Assistance Needed: Physical assistance 25% or less  CARE Score - 3   Walk 10 Feet Uneven Assistance Needed: Physical assistance 25% or less  CARE Score - 88  Assistance Needed: Physical assistance 25% or less  CARE Score - 3   1 Step (Curb)      CARE Score - 88       CARE Score - 88   4 Steps      CARE Score - 88       CARE Score - 88   12 Steps      CARE Score - 88       CARE Score - 88   Picking Up Object      CARE Score - 88       CARE Score - 88   Wheelchair/Scooter Use        Wheel 50 Feet 2 Turns Assistance Needed: Physical assistance Total assistance    CARE Score - Wheel 50 Feet with Two Turns: 1    Type of Wheelchair/Scooter: Manual Assistance Needed: Supervision      CARE Score - Wheel 50 Feet with Two Turns: 4    Type of Wheelchair/Scooter: Manual   Wheel 150 Feet Assistance Needed: Physical assistance Total assistance    CARE Score - Wheel 150 Feet: 1    Type of Wheelchair/Scooter: Manual  Assistance Needed: Physical assistance 25% or less    CARE Score - Wheel 150 Feet: 3    Type of Wheelchair/Scooter: Manual

## 2022-11-30 MED ADMIN — carboxymethylcellulose sodium (REFRESH CELLUVISC) 1 % ophthalmic gel 1 drop: 1 [drp] | OPHTHALMIC | @ 02:00:00

## 2022-11-30 MED ADMIN — HYDROmorphone (PF) (DILAUDID) injection 0.5 mg: .5 mg | INTRAVENOUS | @ 19:00:00 | Stop: 2022-11-30

## 2022-11-30 MED ADMIN — promethazine (PHENERGAN) 6.5 mg in sodium chloride (NS) 0.9 % 50 mL IVPB: 6.5 mg | INTRAVENOUS | @ 13:00:00

## 2022-11-30 MED ADMIN — oxyCODONE (ROXICODONE) immediate release tablet 15 mg: 15 mg | ORAL | @ 16:00:00 | Stop: 2022-12-04

## 2022-11-30 MED ADMIN — HYDROmorphone (PF) (DILAUDID) injection 0.5 mg: .5 mg | INTRAVENOUS | @ 05:00:00 | Stop: 2022-12-13

## 2022-11-30 MED ADMIN — buprenorphine-naloxone (SUBOXONE) 2-0.5 mg SL film 1 mg of buprenorphine: .5 | SUBLINGUAL | @ 13:00:00

## 2022-11-30 MED ADMIN — diazePAM (VALIUM) tablet 2.5 mg: 2.5 mg | ORAL | @ 13:00:00

## 2022-11-30 MED ADMIN — ondansetron (ZOFRAN) injection 4 mg: 4 mg | INTRAVENOUS | @ 02:00:00

## 2022-11-30 MED ADMIN — diclofenac sodium (VOLTAREN) 1 % gel 2 g: 2 g | TOPICAL | @ 13:00:00

## 2022-11-30 MED ADMIN — pantoprazole (Protonix) EC tablet 40 mg: 40 mg | ORAL | @ 13:00:00

## 2022-11-30 MED ADMIN — famotidine (PEPCID) tablet 20 mg: 20 mg | ORAL | @ 02:00:00

## 2022-11-30 MED ADMIN — nirogacestat (OGSIVEO) tablet 150 mg: 150 mg | ORAL | @ 23:00:00 | Stop: 2022-12-26

## 2022-11-30 MED ADMIN — multivitamin with folic acid 400 mcg tablet 1 tablet: 1 | ORAL | @ 13:00:00

## 2022-11-30 MED ADMIN — lidocaine (ASPERCREME) 4 % 2 patch: 2 | TRANSDERMAL | @ 13:00:00

## 2022-11-30 MED ADMIN — HYDROmorphone (PF) (DILAUDID) injection 0.5 mg: .5 mg | INTRAVENOUS | @ 21:00:00 | Stop: 2022-12-13

## 2022-11-30 MED ADMIN — buprenorphine-naloxone (SUBOXONE) 2-0.5 mg SL film 1 mg of buprenorphine: .5 | SUBLINGUAL | @ 02:00:00

## 2022-11-30 MED ADMIN — diazePAM (VALIUM) tablet 2.5 mg: 2.5 mg | ORAL | @ 17:00:00

## 2022-11-30 MED ADMIN — diazePAM (VALIUM) tablet 5 mg: 5 mg | ORAL | @ 02:00:00

## 2022-11-30 MED ADMIN — HYDROmorphone (PF) (DILAUDID) injection 0.5 mg: .5 mg | INTRAVENOUS | @ 13:00:00 | Stop: 2022-12-13

## 2022-11-30 MED ADMIN — carboxymethylcellulose sodium (REFRESH CELLUVISC) 1 % ophthalmic gel 1 drop: 1 [drp] | OPHTHALMIC | @ 13:00:00

## 2022-11-30 MED ADMIN — oxyCODONE (ROXICODONE) immediate release tablet 15 mg: 15 mg | ORAL | @ 02:00:00 | Stop: 2022-12-04

## 2022-11-30 MED ADMIN — nirogacestat (OGSIVEO) tablet 150 mg: 150 mg | ORAL | @ 11:00:00 | Stop: 2022-12-26

## 2022-11-30 MED ADMIN — carboxymethylcellulose sodium (REFRESH CELLUVISC) 1 % ophthalmic gel 1 drop: 1 [drp] | OPHTHALMIC | @ 16:00:00

## 2022-11-30 MED ADMIN — naloxegol (MOVANTIK) 12.5 mg tablet 12.5 mg: 12.5 mg | ORAL | @ 13:00:00

## 2022-11-30 MED ADMIN — oxyCODONE (ROXICODONE) immediate release tablet 15 mg: 15 mg | ORAL | @ 23:00:00 | Stop: 2022-12-04

## 2022-11-30 MED ADMIN — diclofenac sodium (VOLTAREN) 1 % gel 2 g: 2 g | TOPICAL | @ 16:00:00

## 2022-11-30 MED ADMIN — pantoprazole (Protonix) EC tablet 40 mg: 40 mg | ORAL | @ 02:00:00

## 2022-11-30 MED ADMIN — buprenorphine-naloxone (SUBOXONE) 2-0.5 mg SL film 1 mg of buprenorphine: .5 | SUBLINGUAL | @ 17:00:00

## 2022-11-30 MED ADMIN — ondansetron (ZOFRAN) injection 4 mg: 4 mg | INTRAVENOUS | @ 21:00:00

## 2022-11-30 MED ADMIN — scopolamine (TRANSDERM-SCOP) 1 mg over 3 days topical patch 1 mg: 1 | TOPICAL | @ 17:00:00

## 2022-11-30 NOTE — Unmapped (Signed)
Patient [name] [name],A. Takes meds whole in apple sauce. Transfers with sara steady to wc. Given scheduled and prn pain meds on shift, given prn nausea med via IV. Continent B&B. No additional educational needs noted at this time.   Problem: Rehabilitation ([facility]) Plan of Care  Goal: Plan of Care Review  Outcome: Ongoing - Unchanged  Flowsheets (Taken 11/30/2022 0802)  Progress: no change  Plan of Care Reviewed With: patient  Goal: Patient-Specific Goal (Individualized)  Outcome: Ongoing - Unchanged  Goal: Absence of New-Onset Illness or Injury  Outcome: Ongoing - Unchanged  Intervention: Prevent Fall and Fall Injury  Recent Flowsheet Documentation  Taken 11/30/2022 0720 by Olena Leatherwood, RN  Safety Interventions:   fall reduction program maintained   low bed   lighting adjusted for tasks/safety  Intervention: Prevent VTE (Venous Thromboembolism)  Recent Flowsheet Documentation  Taken 11/30/2022 0800 by Olena Leatherwood, RN  VTE Prevention/Management:   anticoagulant therapy   fluids promoted  Goal: Optimal Comfort and Wellbeing  Outcome: Ongoing - Unchanged  Goal: Home and Community Transition Plan Established  Outcome: Ongoing - Unchanged  Goal: Rounds/Family Conference  Outcome: Ongoing - Unchanged     Problem: Self-Care Deficit  Goal: Improved Ability to Complete Activities of Daily Living  Outcome: Ongoing - Unchanged     Problem: Skin Injury Risk Increased  Goal: Skin Health and Integrity  Outcome: Ongoing - Unchanged  Intervention: Optimize Skin Protection  Recent Flowsheet Documentation  Taken 11/30/2022 0800 by Olena Leatherwood, RN  Pressure Reduction Techniques: frequent weight shift encouraged  Pressure Reduction Devices: positioning supports utilized  Taken 11/30/2022 0720 by Olena Leatherwood, RN  Pressure Reduction Techniques: frequent weight shift encouraged  Pressure Reduction Devices: positioning supports utilized     Problem: Pain Acute  Goal: Optimal Pain Control and Function  Outcome: Ongoing - Unchanged     Problem: Fall Injury Risk  Goal: Absence of Fall and Fall-Related Injury  Outcome: Ongoing - Unchanged  Intervention: Promote Injury-Free Environment  Recent Flowsheet Documentation  Taken 11/30/2022 0720 by Olena Leatherwood, RN  Safety Interventions:   fall reduction program maintained   low bed   lighting adjusted for tasks/safety

## 2022-11-30 NOTE — Unmapped (Signed)
AA&O x4. Independent with meals, appetite poor. Medicated with Zofran and Phenergan for nausea, no vomiting. Medicated with Dilaudid x2 this shift and oxycodone x1 for c/o pain 10/10 in shoulders, upper back, and stomach. Mother remains with patient, assists with care.Voiding qs and had large loose stool. DVT,PE, falls and pressure ulcer prevention protocols observed. Safety precautions maintained with bed in low position, wheels locked and call bell in reach. Will continue to monitor.

## 2022-11-30 NOTE — Unmapped (Signed)
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 Admitted to [facility] on 09/25/2022 for ileus, inability to tolerate PO now on supplemental tube feed, and diffuse pain due to muscle spasms of uncertain etiology.      Rehab Impairment Group Code [facility]): (Debility) 16 Debility (Non-Cardiac/Non-Pulmonary)  Etiology: Diffuse pain due to muscle spasms of uncertain etiology and cutaneous desmoid tumors    PLAN:     This patient is admitted to the Physical Medicine and Rehabilitation - Inpatient - C service from 8am-5pm on weekdays for questions regarding this patient. After hours, weekends, and holidays please contact the 1st Call resident pager     REHAB:   - PT and OT to maximize functional status with mobility and ADLs as well as prevention of joint contracture.   - Neuropsych for higher level cognitive evaluation and coping.  - RT for community re-integration, education, and leisure support services.  - Nutrition consult for diet information/teaching.   - To be discussed in weekly Interdisciplinary Team Conference.     Increased Tonicity  Dystonia   Started post-procedurally per patient ~09/25/22. Occurs intermittently throughout the day with a heightened occurrence during mobilization. At rest patient bilateral ankles in plantarflexion with worsening heel cord tightening. Has tonicity most prominent to HF and KE that is not velocity dependent. Has not noted significant improvement with increase of baclofen to 15 mg TID in setting of infection as below. Neurology previously evaluated patient seemingly ruling out an acute dystonic reaction, cord compression, central process, nutritional deficiency, stiff person syndrome and [name] [name] after normal EMG on 8/22. Continues to be well controlled with valium, small improvement with baclofen PRN.  - Continue Baclofen 10 mg TID prn  - Continued Valium 2.5/2.5/5 mg TID and 2.5 mg nightly PRN  - PRAFO boots in bed     Headache  Currently managed with tylenol. She does have some posterior neck muscle tightness. Notes ketamine is helpful for headache pains. Describes photophobia and phonophobia with events. She does have loss of cervical lordosis on most recent MRI.   - Consider starting propranolol 5 mg BID for headache prophylaxis.   - PRN tylenol +/- prn fiorcet   - Lidocaine cream PRN to area of pain for cervicogenic headaches     Pain  Pain management consulted with recommendations as below. Patient will need to follow up with outpatient chronic pain provider ([name]).  - Tylenol 650 mg q6h PRN  - CHANGE IV Dilaudid 0.5 mg to q8h PRN; will wean as tolerated.  - Oxyocodone 10-15 mg q4h PRN  - Suboxone 2-0.5 mg TID  - STOP Gabapentin 100 mg TID, 9/17 per patient preference (see below with anasarca related to Lyrica)  - Voltaren gel QID, lidocaine patches  - Continue patellar taping with therapy or obtain patellar brace to help with pain with ambulation  - Continue Movantik 12.5mg  daily; discontinue upon discharge from AIR    Anasarca  Patient noted increased lower extremity, facial, and abdominal swelling prior to admission to AIR. Reported that pitting edema was present when taking Lyrica. No current medications with significant side effects of swelling. Requested IV Lasix as this was helpful in the past. Noted that some edema could be related to gabapentin, will plan to monitor and discontinue as  indicated. On exam, patient with mild edema in bilateral feet, not appreciated elsewhere on exam. Noted improvement in edema with IV Lasix. Will continue to monitor daily and dose as needed. Oral Bumex trial 9/18 modestly helpful.    - Bumex 0.5mg  x1 9/18. Lasix IV 10mg  x1 9/19. Lasix IV 10mg  x1 9/20  - Daily standing weight  - Increase protein intake per dietician  - STOP gabapentin 9/17; can consider restarting if no change in edema     Multifactorial Ileus   Nausea (chronic)  [place] Emesis 2/2 NGT placement (resolved)  Contributors with history of narcotics, poor PO intake. Concern for malnutrition. Improvements to abdominal pain since weaning opioids. TPN has been discontinued with patient now tolerating tube feeds. Patient with chronic nausea that was previously managed with Zofran, though transitioned to phenergan due to prolonged Qtc. Upon admission to AIR, continued nightly tube feeds with Corpak NG tube in place, and PO intake as tolerated during the day. Will continue to decrease tube feeds with goal of discharge from AIR on full regular diet. If unable to tolerate full PO diet, will consider discharge home with corpak. Patient noted weight gain upon arrival to AIR (felt likely fluid retention) as weight was increased in a short period of time.   - Dietician following; appreciate care and recommendations  - Continue nightly tube feeds  - Continue PO regular diet + protein supplementation; anticipate NG tube removal prior to discharge   - Calorie count  - Daily standing weights   - Continue Protonix 40mg  BID  - Continue IV phenergan 6.5mg  Q6 PRN; Scopolamine patch ordered 9/19 in hopes of transitioning off IV  [ ]  restart home cefdinir and doxycycline upon discharge from AIR for SIBP     Syncope  Multifactorial. Primary team ddx includes: vasovagal vs adverse effect of medications vs deconditioning vs iron deficiency. Last echo 04/2022. Stable upon admission to AIR.   - CTM     IDA  Baseline hemoglobin ~9-10. Stable upon admission to AIR at 9.8. Will continue to monitor while admitted.   - M/Th CBC  - Has allergy to formula iron      [name] - Desmoid Tumors  Follows [facility] (Dr. [name]) is patient's outpatient oncologist. Per oncology, most common side effects of the chemotherapy are diarrhea, nausea, headache, and rash. Patient has not had increase in baseline nausea while taking the medication and increased dose as planned on 9/17. Will continue to monitor and discuss with outpatient provider as indicated.   - Nirogacestat 150mg  BID; increased 9/17  - M/Th CMP; monitor LFT     Enterobacteriaceae group bacteremia (Resolved) - C albicans fungemia (Resolved)  Likely due to RIJ tunneled line placed 01/2022 now removed on 8/14.  8/13, 8/17 repeat blood cultures NGTD.  No evidence of vitreitis, retinitis 8/15 per Ophtho.  TTE 8/15 w possible vegetations on MV and TV or indwelling catheter; however, TEE 8/20 w/o vegetations. ID consulted during acute admission and now s/p treatment with cephalosporin and fluconazole on 8/31. Patient noted to have difficult IV access and kept R IJ access upon admission to AIR.   [ ]  Remove R internal jugular access when able to wean off IV meds; will need to remove prior to discharge home     Generalized anxiety disorder, depression, h/o panic attacks  Psychiatry consulted during acute hospitalization with recommendations as below.   - Consider decreasing mirtazapine 15 mg nightly to 7.5 mg nightly due to somnolence  - Valium  as above, if needed could give 2.5mg  PRN for acute anxiety  - Neuropsychology consulted; appreciate care     Daily Checklist  - Diet: Regular diet + nighttime tube feeds  - DVT PPX: SQ enoxaparin   - GI PPX: Protonix  - Access: R IJ non tunneled CVC. Necessary for IV medications; will remove when able.    DISPO: Patient to be discussed at weekly interdisciplinary team conference.   - EDD: 12/10/2022  - Follow-up: PCP, Chronic Pain, Oncology (Dr. [name]), Psych    SUBJECTIVE and PROGRESS WITH FUNCTIONAL ACTIVITIES:     Weekend events:  9/21: Patient with itching and blistering at internal jugular site. She is requesting IV benadryl but was agreeable to oral benadryl to help with the itching. Also requesting prosource packets as she hasn't gotten them for the last 2 days. Will hold lovenox at this time as patient states frustration with being asked about lovenox.    9/22: Patient very upset this morning and last night due to medication decrease that began yesterday at 6 AM as her Dilaudid became more spaced out from every 6 to every 8 hours.  Patient states she is very upset and does appear tearful.  Informed her that we did not want to go against primary team's plan but due to her severe pain could give one-time orders of pain medication if she needs a but she will need to ask her nurse to reach out to [name] for that.  Patient was reassured by this.  She also is concerned about her lower extremity edema and abdominal edema informed her that the primary team wants to see how she does without Lasix for the weekend patient voiced understanding.    OBJECTIVE:     Vital signs (last 24 hours):  Temp:  [36.7 ??C (98.1 ??F)] 36.7 ??C (98.1 ??F)  Heart Rate:  [100-101] 100  Resp:  [15-18] 15  BP: (110-121)/(58-76) 110/58  MAP (mmHg):  [74-90] 74  SpO2:  [98 %-99 %] 99 %    Intake/Output (last 3 shifts):  I/O last 3 completed shifts:  In: 815 [P.O.:30; I.V.:30; NG/GT:755]  Out: 0     Physical Exam:   GEN: Sitting in bed with family at bedside  EYES: sclera anicteric, conjunctiva clear   HEENT: corpak in place L nare. R I.J. in place (nontunneled)  CV: Warm and well perfused, trace edema in bilateral feet (improved)  RESP:  No increased work of breathing on 2L [place]  ABD: Not distended  SKIN: No visible masses, lesions, rashes, ecchymoses.   MSK: Not visualized today, previously sacrum with some soft tissue swelling near lumbosacral junction.   NEURO: Alert, speech fluent  PSYCH: Cheerful    Medications:  Scheduled    buprenorphine-naloxone (SUBOXONE) 2-0.5 mg SL film 1 mg of buprenorphine TID    carboxymethylcellulose sodium (REFRESH CELLUVISC) 1 % ophthalmic gel 1 drop QID    diazePAM (VALIUM) tablet 2.5 mg Daily    And    diazePAM (VALIUM) tablet 2.5 mg Daily    diazePAM (VALIUM) tablet 5 mg Nightly diclofenac sodium (VOLTAREN) 1 % gel 2 g QID    [Provider Hold] enoxaparin (LOVENOX) syringe 40 mg Q24H SCH    famotidine (PEPCID) tablet 20 mg Nightly    lidocaine (ASPERCREME) 4 % 2 patch Daily    mirtazapine (REMERON) tablet 15 mg Nightly    multivitamin with folic acid 400 mcg tablet 1 tablet Daily    naloxegol (MOVANTIK) 12.5 mg tablet 12.5 mg Daily  nirogacestat (OGSIVEO) tablet 150 mg BID    pantoprazole (Protonix) EC tablet 40 mg BID    scopolamine (TRANSDERM-SCOP) 1 mg over 3 days topical patch 1 mg Q72H     PRN acetaminophen, 650 mg, Q6H PRN  alum-mag-simeth, 60 mL, Q6H PRN  baclofen, 10 mg, TID PRN  bisacodyl, 10 mg, BID PRN  butalbital-acetaminophen-caffeine, 1 tablet, BID PRN  carboxymethylcellulose sodium, 2 drop, QID PRN  Chemo Clarification Order, , Continuous PRN  Chemo Clarification Order, , Continuous PRN  diazePAM, 2.5 mg, Nightly PRN  diphenhydrAMINE, 25 mg, Q6H PRN  guaiFENesin, 200 mg, Q4H PRN  hydrocortisone, , BID PRN  HYDROmorphone, 0.5 mg, Q8H PRN  IP okay to treat, , Continuous PRN  lidocaine-diphenhydrAMINE-aluminum-magnesium, 10 mL, QID PRN  ondansetron, 4 mg, Q12H PRN  oxyCODONE, 10 mg, Q4H PRN   Or  oxyCODONE, 15 mg, Q4H PRN  phenol, 2 spray, Q2H PRN  polyethylene glycol, 17 g, BID PRN  promethazine, 6.5 mg, Q6H PRN  pseudoephedrine, 30 mg, Daily PRN  senna, 1 tablet, BID PRN  zinc oxide-cod liver oil, , Daily PRN        Labs/Studies: Reviewed.    Radiology Results: Reviewed    Quality Indicators      Hearing, Speech, and Vision  Ability to Hear: Adequate  Ability to See in Adequate Light: Adequate  Expression of Ideas and Wants: Without difficulty  Understanding Verbal and Non-Verbal Content: Understands    Cognitive Pattern Assessment  Cognitive Pattern Assessment Used: BIMS  Brief Interview for Mental Status (BIMS)  Repetition of Three Words (First Attempt): 3  Temporal Orientation: Year: Correct  Temporal Orientation: Month: Accurate within 5 days  Temporal Orientation: Day: Correct  Recall: Sock: Yes, no cue required  Recall: Blue: Yes, no cue required  Recall: Bed: Yes, no cue required  BIMS Summary Score: 15       Nutritional Approaches  Nutritional Approach: Feeding tube    ADLs  Admission Current   Eating Assistance Needed: Set-up / clean-up    CARE Score - 5 Assistance Needed: Set-up / clean-up    CARE Score - 5   Oral Hygiene  Assistance Needed: Set-up / clean-up    CARE Score - 5  Assistance Needed: Set-up / clean-up    CARE Score - 5   Bladder Continence       Bowel Continence       Toileting Hygiene Assistance Needed: Physical assistance Total assistance  CARE Score - 1 Assistance Needed: Physical assistance    CARE Score - 1    Toilet Transfer Assistance Needed: Physical assistance Total assistance  CARE Score - 88 Assistance Needed: Physical assistance    CARE Score - 3   Shower/Bathe Self Assistance Needed: Physical assistance 26%-50%  CARE Score - 88  Assistance Needed: Physical assistance    CARE Score - 3   Upper Body Dressing Assistance Needed: Physical assistance 26%-50%  CARE Score - 3  Assistance Needed: Set-up / clean-up    CARE Score - 5   Lower Body Dressing Assistance Needed: Physical assistance Total assistance  CARE Score - 1  Assistance Needed: Physical assistance    CARE Score - 1   On/Off Footwear Assistance Needed: Physical assistance Total assistance  CARE Score - 1  Assistance Needed: Physical assistance    CARE Score - 1         Transfers Admission Current   Bed to Chair Assistance Needed: Physical assistance Total assistance  CARE Score - 1  Assistance Needed: Incidental touching, Adaptive equipment    CARE Score - 4     Lying to Sitting Assistance Needed: Physical assistance Total assistance  CARE Score - 1  Assistance Needed: Physical assistance    CARE Score - 3   Roll Left/Right Assistance Needed: Physical assistance 76% or more  CARE Score - 2  Assistance Needed: Physical assistance    CARE Score - 3   Sit to Lying Assistance Needed: Physical assistance Total assistance  CARE Score - 1  Assistance Needed: Physical assistance    CARE Score - 3   Sit to Stand Assistance Needed: Physical assistance Total assistance  CARE Score - 1  Assistance Needed: Incidental touching, Adaptive equipment    CARE Score - 4         Mobility Admission Current   Walk 10 Feet Assistance Needed: Physical assistance Total assistance  CARE Score - 1 Assistance Needed: Incidental touching, Adaptive equipment    CARE Score - 4     Walk 50 Feet 2 Turns Assistance Needed: Physical assistance 25% or less  CARE Score - 88 Assistance Needed: Incidental touching, Adaptive equipment    CARE Score - 4   Walk 150 Feet Assistance Needed: Physical assistance 25% or less  CARE Score - 88  Assistance Needed: Incidental touching, Adaptive equipment    CARE Score - 4   Walk 10 Feet Uneven Assistance Needed: Physical assistance 25% or less  CARE Score - 88  Assistance Needed: Physical assistance    CARE Score - 3   1 Step (Curb) Assistance Needed: Incidental touching, Adaptive equipment    CARE Score - 88  Assistance Needed: Incidental touching, Adaptive equipment    CARE Score - 4   4 Steps Assistance Needed: Incidental touching, Adaptive equipment    CARE Score - 88  Assistance Needed: Incidental touching, Adaptive equipment    CARE Score - 4   12 Steps      CARE Score - 88       CARE Score - 88   Picking Up Object      CARE Score - 88       CARE Score - 88   Wheelchair/Scooter Use        Wheel 50 Feet 2 Turns Assistance Needed: Physical assistance Total assistance    CARE Score - Wheel 50 Feet with Two Turns: 1    Type of Wheelchair/Scooter: Manual Assistance Needed: Supervision      CARE Score - Wheel 50 Feet with Two Turns: 4    Type of Wheelchair/Scooter: Manual   Wheel 150 Feet Assistance Needed: Physical assistance Total assistance    CARE Score - Wheel 150 Feet: 1    Type of Wheelchair/Scooter: Manual  Assistance Needed: Physical assistance      CARE Score - Wheel 150 Feet: 3    Type of Wheelchair/Scooter: Manual

## 2022-11-30 NOTE — Unmapped (Signed)
Patient is alert, oriented, not in distress; able to take pills whole with apple sauce; tube feeding started; continent in urine and bowel; complained of pain; PRN Oxycodone and Dilaudid given once overnight; complained of nausea, PRN Zofran given once. Patient felt frustrated that the frequency of her IV pain meds was modified not according to plan, oncall provider notified. Fall, Aspiration and Delirium precautions observed and maintained; Provided call bell within reach;       Problem: Rehabilitation ([facility]) Plan of Care  Goal: Plan of Care Review  Outcome: Progressing  Goal: Patient-Specific Goal (Individualized)  Outcome: Progressing  Goal: Absence of New-Onset Illness or Injury  Outcome: Progressing  Intervention: Prevent Fall and Fall Injury  Recent Flowsheet Documentation  Taken 11/30/2022 0400 by Rickard Patience, RN  Safety Interventions:   fall reduction program maintained   low bed  Taken 11/30/2022 0200 by Rickard Patience, RN  Safety Interventions:   fall reduction program maintained   low bed  Taken 11/30/2022 0000 by Rickard Patience, RN  Safety Interventions:   fall reduction program maintained   low bed  Taken 11/29/2022 2200 by Rickard Patience, RN  Safety Interventions:   fall reduction program maintained   low bed  Taken 11/29/2022 2000 by Rickard Patience, RN  Safety Interventions:   fall reduction program maintained   low bed  Intervention: Prevent Skin Injury  Recent Flowsheet Documentation  Taken 11/29/2022 2000 by Rickard Patience, RN  Skin Protection: incontinence pads utilized  Intervention: Prevent Infection  Recent Flowsheet Documentation  Taken 11/30/2022 0400 by Rickard Patience, RN  Infection Prevention:   hand hygiene promoted   single patient room provided  Taken 11/30/2022 0200 by Rickard Patience, RN  Infection Prevention:   hand hygiene promoted   single patient room provided  Taken 11/30/2022 0000 by Rickard Patience, RN  Infection Prevention:   hand hygiene promoted   single patient room provided  Taken 11/29/2022 2200 by Rickard Patience, RN  Infection Prevention:   hand hygiene promoted   single patient room provided  Taken 11/29/2022 2000 by Rickard Patience, RN  Infection Prevention:   hand hygiene promoted   single patient room provided  Goal: Optimal Comfort and Wellbeing  Outcome: Progressing  Goal: Home and Community Transition Plan Established  Outcome: Progressing  Goal: Rounds/Family Conference  Outcome: Progressing     Problem: Self-Care Deficit  Goal: Improved Ability to Complete Activities of Daily Living  Outcome: Progressing     Problem: Skin Injury Risk Increased  Goal: Skin Health and Integrity  Outcome: Progressing  Intervention: Optimize Skin Protection  Recent Flowsheet Documentation  Taken 11/29/2022 2000 by Rickard Patience, RN  Activity Management: up in chair  Pressure Reduction Techniques: frequent weight shift encouraged  Pressure Reduction Devices: positioning supports utilized  Skin Protection: incontinence pads utilized     Problem: Pain Acute  Goal: Optimal Pain Control and Function  Outcome: Progressing     Problem: Fall Injury Risk  Goal: Absence of Fall and Fall-Related Injury  Outcome: Progressing  Intervention: Promote Scientist, clinical (histocompatibility and immunogenetics) Documentation  Taken 11/30/2022 0400 by Rickard Patience, RN  Safety Interventions:   fall reduction program maintained   low bed  Taken 11/30/2022 0200 by Rickard Patience, RN  Safety Interventions:   fall reduction program maintained   low bed  Taken 11/30/2022 0000 by Rickard Patience, RN  Safety Interventions:   fall reduction program maintained  low bed  Taken 11/29/2022 2200 by Rickard Patience, RN  Safety Interventions:   fall reduction program maintained   low bed  Taken 11/29/2022 2000 by Rickard Patience, RN  Safety Interventions:   fall reduction program maintained   low bed

## 2022-12-01 LAB — MAGNESIUM: MAGNESIUM: 2.3 mg/dL (ref 1.6–2.6)

## 2022-12-01 LAB — COMPREHENSIVE METABOLIC PANEL
ALBUMIN: 3.3 g/dL — ABNORMAL LOW (ref 3.4–5.0)
ALKALINE PHOSPHATASE: 75 U/L (ref 46–116)
ALT (SGPT): <7 U/L — ABNORMAL LOW (ref 10–49)
ANION GAP: 6 mmol/L (ref 5–14)
AST (SGOT): 19 U/L (ref <=34)
BILIRUBIN TOTAL: 0.2 mg/dL — ABNORMAL LOW (ref 0.3–1.2)
BLOOD UREA NITROGEN: 7 mg/dL — ABNORMAL LOW (ref 9–23)
BUN / CREAT RATIO: 17
CALCIUM: 8.9 mg/dL (ref 8.7–10.4)
CHLORIDE: 109 mmol/L — ABNORMAL HIGH (ref 98–107)
CO2: 27.4 mmol/L (ref 20.0–31.0)
CREATININE: 0.42 mg/dL — ABNORMAL LOW
EGFR CKD-EPI (2021) FEMALE: >90 mL/min/{1.73_m2} (ref >=60)
GLUCOSE RANDOM: 119 mg/dL (ref 70–179)
POTASSIUM: 3.3 mmol/L — ABNORMAL LOW (ref 3.4–4.8)
PROTEIN TOTAL: 6.1 g/dL (ref 5.7–8.2)
SODIUM: 142 mmol/L (ref 135–145)

## 2022-12-01 LAB — CBC
HEMATOCRIT: 32.3 % — ABNORMAL LOW (ref 34.0–44.0)
HEMOGLOBIN: 10.8 g/dL — ABNORMAL LOW (ref 11.3–14.9)
MEAN CORPUSCULAR HEMOGLOBIN CONC: 33.4 g/dL (ref 32.0–36.0)
MEAN CORPUSCULAR HEMOGLOBIN: 27.7 pg (ref 25.9–32.4)
MEAN CORPUSCULAR VOLUME: 82.9 fL (ref 77.6–95.7)
MEAN PLATELET VOLUME: 9.3 fL (ref 6.8–10.7)
PLATELET COUNT: 236 10*9/L (ref 150–450)
RED BLOOD CELL COUNT: 3.9 10*12/L — ABNORMAL LOW (ref 3.95–5.13)
RED CELL DISTRIBUTION WIDTH: 15.6 % — ABNORMAL HIGH (ref 12.2–15.2)
WBC ADJUSTED: 7.8 10*9/L (ref 3.6–11.2)

## 2022-12-01 LAB — PHOSPHORUS: PHOSPHORUS: 3.5 mg/dL (ref 2.4–5.1)

## 2022-12-01 MED ADMIN — diazePAM (VALIUM) tablet 2.5 mg: 2.5 mg | ORAL | @ 14:00:00

## 2022-12-01 MED ADMIN — nirogacestat (OGSIVEO) tablet 150 mg: 150 mg | ORAL | @ 10:00:00 | Stop: 2022-12-26

## 2022-12-01 MED ADMIN — nirogacestat (OGSIVEO) tablet 150 mg: 150 mg | ORAL | @ 22:00:00 | Stop: 2022-12-26

## 2022-12-01 MED ADMIN — diazePAM (VALIUM) tablet 2.5 mg: 2.5 mg | ORAL | @ 17:00:00

## 2022-12-01 MED ADMIN — HYDROmorphone (PF) (DILAUDID) injection 0.5 mg: .5 mg | INTRAVENOUS | @ 17:00:00 | Stop: 2022-12-07

## 2022-12-01 MED ADMIN — buprenorphine-naloxone (SUBOXONE) 2-0.5 mg SL film 1 mg of buprenorphine: .5 | SUBLINGUAL | @ 17:00:00

## 2022-12-01 MED ADMIN — buprenorphine-naloxone (SUBOXONE) 2-0.5 mg SL film 1 mg of buprenorphine: .5 | SUBLINGUAL | @ 02:00:00

## 2022-12-01 MED ADMIN — oxyCODONE (ROXICODONE) immediate release tablet 15 mg: 15 mg | ORAL | @ 04:00:00 | Stop: 2022-12-04

## 2022-12-01 MED ADMIN — pantoprazole (Protonix) EC tablet 40 mg: 40 mg | ORAL | @ 02:00:00

## 2022-12-01 MED ADMIN — naloxegol (MOVANTIK) 12.5 mg tablet 12.5 mg: 12.5 mg | ORAL | @ 14:00:00

## 2022-12-01 MED ADMIN — HYDROmorphone (PF) (DILAUDID) injection 0.5 mg: .5 mg | INTRAVENOUS | @ 01:00:00 | Stop: 2022-11-30

## 2022-12-01 MED ADMIN — multivitamin with folic acid 400 mcg tablet 1 tablet: 1 | ORAL | @ 14:00:00

## 2022-12-01 MED ADMIN — HYDROmorphone (PF) (DILAUDID) injection 0.5 mg: .5 mg | INTRAVENOUS | @ 20:00:00 | Stop: 2022-12-01

## 2022-12-01 MED ADMIN — potassium chloride ER tablet 40 mEq: 40 meq | ORAL | @ 14:00:00 | Stop: 2022-12-01

## 2022-12-01 MED ADMIN — promethazine (PHENERGAN) 6.5 mg in sodium chloride (NS) 0.9 % 50 mL IVPB: 6.5 mg | INTRAVENOUS | @ 05:00:00

## 2022-12-01 MED ADMIN — HYDROmorphone (PF) (DILAUDID) injection 0.5 mg: .5 mg | INTRAVENOUS | @ 10:00:00 | Stop: 2022-12-01

## 2022-12-01 MED ADMIN — pantoprazole (Protonix) EC tablet 40 mg: 40 mg | ORAL | @ 14:00:00

## 2022-12-01 MED ADMIN — ondansetron (ZOFRAN) injection 4 mg: 4 mg | INTRAVENOUS | @ 17:00:00

## 2022-12-01 MED ADMIN — diclofenac sodium (VOLTAREN) 1 % gel 2 g: 2 g | TOPICAL | @ 17:00:00

## 2022-12-01 MED ADMIN — HYDROmorphone (PF) (DILAUDID) injection 0.5 mg: .5 mg | INTRAVENOUS | @ 01:00:00 | Stop: 2022-12-13

## 2022-12-01 MED ADMIN — carboxymethylcellulose sodium (REFRESH CELLUVISC) 1 % ophthalmic gel 1 drop: 1 [drp] | OPHTHALMIC | @ 17:00:00

## 2022-12-01 MED ADMIN — diazePAM (VALIUM) tablet 5 mg: 5 mg | ORAL | @ 02:00:00

## 2022-12-01 MED ADMIN — famotidine (PEPCID) tablet 20 mg: 20 mg | ORAL | @ 02:00:00

## 2022-12-01 MED ADMIN — oxyCODONE (ROXICODONE) immediate release tablet 15 mg: 15 mg | ORAL | @ 14:00:00 | Stop: 2022-12-04

## 2022-12-01 MED ADMIN — buprenorphine-naloxone (SUBOXONE) 2-0.5 mg SL film 1 mg of buprenorphine: .5 | SUBLINGUAL | @ 14:00:00

## 2022-12-01 MED ADMIN — carboxymethylcellulose sodium (REFRESH CELLUVISC) 1 % ophthalmic gel 1 drop: 1 [drp] | OPHTHALMIC | @ 14:00:00

## 2022-12-01 MED ADMIN — carboxymethylcellulose sodium (REFRESH CELLUVISC) 1 % ophthalmic gel 1 drop: 1 [drp] | OPHTHALMIC | @ 22:00:00

## 2022-12-01 NOTE — Unmapped (Signed)
Physical Medicine and Rehabilitation  Daily Progress Note 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 Admitted to [facility] on 09/25/2022 for ileus, inability to tolerate PO now on supplemental tube feed, and diffuse pain due to muscle spasms of uncertain etiology.      Rehab Impairment Group Code [facility]): (Debility) 16 Debility (Non-Cardiac/Non-Pulmonary)  Etiology: Diffuse pain due to muscle spasms of uncertain etiology and cutaneous desmoid tumors    PLAN:     This patient is admitted to the Physical Medicine and Rehabilitation - Inpatient - C service from 8am-5pm on weekdays for questions regarding this patient. After hours, weekends, and holidays please contact the 1st Call resident pager     REHAB:   - PT and OT to maximize functional status with mobility and ADLs as well as prevention of joint contracture.   - Neuropsych for higher level cognitive evaluation and coping.  - RT for community re-integration, education, and leisure support services.  - Nutrition consult for diet information/teaching.   - To be discussed in weekly Interdisciplinary Team Conference.     Increased Tonicity  Dystonia   Started post-procedurally per patient ~09/25/22. Occurs intermittently throughout the day with a heightened occurrence during mobilization. At rest patient bilateral ankles in plantarflexion with worsening heel cord tightening. Has tonicity most prominent to HF and KE that is not velocity dependent. Has not noted significant improvement with increase of baclofen to 15 mg TID in setting of infection as below. Neurology previously evaluated patient seemingly ruling out an acute dystonic reaction, cord compression, central process, nutritional deficiency, stiff person syndrome and [name] after normal EMG on 8/22. Continues to be well controlled with valium, small improvement with baclofen PRN.  - Continue Baclofen 10 mg TID prn  - Continued Valium 2.5/2.5/5 mg TID and 2.5 mg nightly PRN  - PRAFO boots in bed     Headache  Currently managed with tylenol. She does have some posterior neck muscle tightness. Notes ketamine is helpful for headache pains. Describes photophobia and phonophobia with events. She does have loss of cervical lordosis on most recent MRI.   - Consider starting propranolol 5 mg BID for headache prophylaxis.   - PRN tylenol +/- prn fiorcet   - Lidocaine cream PRN to area of pain for cervicogenic headaches     Pain  Pain management consulted with recommendations as below. Patient will need to follow up with outpatient chronic pain provider ([name]). Gabapentin discontinued 9/17 and will not be continued upon discharge.   - Tylenol 650 mg q6h PRN  - CHANGE IV Dilaudid 0.5 mg to q6h PRN; will wean as tolerated.  - Oxyocodone 10-15 mg q4h PRN  - Suboxone 2-0.5 mg TID  - Voltaren gel QID, lidocaine patches  - Continue patellar taping with therapy or obtain patellar brace to help with pain with ambulation  - Continue Movantik 12.5mg  daily; discontinue upon discharge from AIR    Anasarca  Patient noted increased lower extremity, facial, and abdominal swelling prior to admission to AIR. Reported that pitting edema was present when taking Lyrica. Gabapentin discontinued 9/17. No current medications with significant side effects of swelling. Requested IV Lasix as this was helpful in the past. Noted that some edema could be related to gabapentin, will plan to monitor and discontinue as indicated. On exam, patient  with mild edema in bilateral feet, not appreciated elsewhere on exam. Noted improvement in edema with IV Lasix. Will continue to monitor daily and dose as needed. Oral Bumex trial 9/18 modestly helpful.    - Bumex 0.5mg  x1 9/18. Lasix IV 10mg  x1 9/19. Lasix IV 10mg  x1 9/20  - Daily standing weight  - Increase protein intake per dietician     Multifactorial Ileus   Nausea (chronic)  [employer] Emesis 2/2 NGT placement (resolved)  Contributors with history of narcotics, poor PO intake. Concern for malnutrition. Improvements to abdominal pain since weaning opioids. TPN has been discontinued with patient now tolerating tube feeds. Patient with chronic nausea that was previously managed with Zofran, though transitioned to phenergan due to prolonged Qtc. Upon admission to AIR, continued nightly tube feeds with Corpak NG tube in place, and PO intake as tolerated during the day. Will continue to decrease tube feeds with goal of discharge from AIR on full regular diet. If unable to tolerate full PO diet, will consider discharge home with corpak. Patient noted weight gain upon arrival to AIR (felt likely fluid retention) as weight was increased in a short period of time.   - Dietician following; appreciate care and recommendations  - Continue nightly tube feeds  - Continue PO regular diet + protein supplementation; anticipate NG tube removal prior to discharge   - Calorie count  - Daily standing weights   - Continue Protonix 40mg  BID  - Continue IV phenergan 6.5mg  Q6 PRN; Scopolamine patch ordered 9/19 in hopes of transitioning off IV  [ ]  restart home cefdinir and doxycycline upon discharge from AIR for SIBP     Syncope  Multifactorial. Primary team ddx includes: vasovagal vs adverse effect of medications vs deconditioning vs iron deficiency. Last echo 04/2022. Stable upon admission to AIR.   - CTM     IDA  Baseline hemoglobin ~9-10. Stable upon admission to AIR at 9.8. Will continue to monitor while admitted.   - M/Th CBC  - Has allergy to formula iron      [name] - Desmoid Tumors  Follows [facility] (Dr. [name]) is patient's outpatient oncologist. Per oncology, most common side effects of the chemotherapy are diarrhea, nausea, headache, and rash. Patient has not had increase in baseline nausea while taking the medication and increased dose as planned on 9/17. Will continue to monitor and discuss with outpatient provider as indicated.   - Nirogacestat 150mg  BID; increased 9/17  - M/Th CMP; monitor LFT     Enterobacteriaceae group bacteremia (Resolved) - C albicans fungemia (Resolved)  Likely due to RIJ tunneled line placed 01/2022 now removed on 8/14.  8/13, 8/17 repeat blood cultures NGTD.  No evidence of vitreitis, retinitis 8/15 per Ophtho.  TTE 8/15 w possible vegetations on MV and TV or indwelling catheter; however, TEE 8/20 w/o vegetations. ID consulted during acute admission and now s/p treatment with cephalosporin and fluconazole on 8/31. Patient noted to have difficult IV access and kept R IJ access upon admission to AIR.   [ ]  Remove R internal jugular access when able to wean off IV meds; will need to remove prior to discharge home  - placed peripheral IV 9/23   - plan to remove central line 9/23     Generalized anxiety disorder, depression, h/o panic attacks  Psychiatry consulted during acute hospitalization with recommendations as below.   - Consider decreasing mirtazapine 15 mg nightly to 7.5 mg nightly due to somnolence  - Valium as above,  if needed could give 2.5mg  PRN for acute anxiety  - Neuropsychology consulted; appreciate care     Daily Checklist  - Diet: Regular diet + nighttime tube feeds  - DVT PPX: SQ enoxaparin   - GI PPX: Protonix  - Access: R IJ non tunneled CVC. Necessary for IV medications; will remove when able.    DISPO: Patient to be discussed at weekly interdisciplinary team conference.   - EDD: 12/10/2022  - Follow-up: PCP, Chronic Pain, Oncology (Dr. [name]), Psych    SUBJECTIVE and PROGRESS WITH FUNCTIONAL ACTIVITIES:     Interval events:  NAEON. Patient tearful this morning regarding her pain, nausea, and central line. Required dilaudid x4 plus an additional 2 PRNs. Discussed reverting back to Q6 hrs as needed. Long discussion regarding the state of her central line, concerned with blistering and duration of the line (39 days as of today). Plan to place peripheral IV for medication usage and will consult VIR to remove central line. Contingency planning should her IV infiltrate will be to change to all oral formulations (noted previous difficulty during acute admission, though this was with antibiotic infusions).     OBJECTIVE:     Vital signs (last 24 hours):  Temp:  [36.6 ??C (97.9 ??F)] 36.6 ??C (97.9 ??F)  Heart Rate:  [97] 97  Resp:  [19] 19  BP: (109)/(63) 109/63  MAP (mmHg):  [78] 78  SpO2:  [100 %] 100 %    Intake/Output (last 3 shifts):  I/O last 3 completed shifts:  In: 565 [P.O.:60; I.V.:30; NG/GT:475]  Out: 0     Physical Exam:   GEN: Lying in bed, tearful  EYES: sclera anicteric, conjunctiva clear   HEENT: Corpak in place L nare. R IJ in place (nontunneled)  CV: Warm and well perfused, trace edema in bilateral feet (improved)  RESP:  No increased work of breathing on 2L [place]  ABD: Not distended  SKIN: R IJ   MSK: Not visualized today, previously sacrum with some soft tissue swelling near lumbosacral junction   NEURO: Alert, speech fluent  PSYCH: Tearful, anxious appearing    Medications:  Scheduled    buprenorphine-naloxone (SUBOXONE) 2-0.5 mg SL film 1 mg of buprenorphine TID    carboxymethylcellulose sodium (REFRESH CELLUVISC) 1 % ophthalmic gel 1 drop QID    diazePAM (VALIUM) tablet 2.5 mg Daily    And    diazePAM (VALIUM) tablet 2.5 mg Daily    diazePAM (VALIUM) tablet 5 mg Nightly    diclofenac sodium (VOLTAREN) 1 % gel 2 g QID    [Provider Hold] enoxaparin (LOVENOX) syringe 40 mg Q24H SCH    famotidine (PEPCID) tablet 20 mg Nightly    lidocaine (ASPERCREME) 4 % 2 patch Daily    mirtazapine (REMERON) tablet 15 mg Nightly    multivitamin with folic acid 400 mcg tablet 1 tablet Daily    naloxegol (MOVANTIK) 12.5 mg tablet 12.5 mg Daily    nirogacestat (OGSIVEO) tablet 150 mg BID    pantoprazole (Protonix) EC tablet 40 mg BID    scopolamine (TRANSDERM-SCOP) 1 mg over 3 days topical patch 1 mg Q72H     PRN acetaminophen, 650 mg, Q6H PRN  alum-mag-simeth, 60 mL, Q6H PRN  baclofen, 10 mg, TID PRN  bisacodyl, 10 mg, BID PRN  butalbital-acetaminophen-caffeine, 1 tablet, BID PRN  carboxymethylcellulose sodium, 2 drop, QID PRN  Chemo Clarification Order, , Continuous PRN  Chemo Clarification Order, , Continuous PRN  diazePAM, 2.5 mg, Nightly PRN  diphenhydrAMINE, 25 mg, Q6H  PRN  guaiFENesin, 200 mg, Q4H PRN  hydrocortisone, , BID PRN  HYDROmorphone, 0.5 mg, Q8H PRN  IP okay to treat, , Continuous PRN  lidocaine-diphenhydrAMINE-aluminum-magnesium, 10 mL, QID PRN  ondansetron, 4 mg, Q12H PRN  oxyCODONE, 10 mg, Q4H PRN   Or  oxyCODONE, 15 mg, Q4H PRN  phenol, 2 spray, Q2H PRN  polyethylene glycol, 17 g, BID PRN  promethazine, 6.5 mg, Q6H PRN  pseudoephedrine, 30 mg, Daily PRN  senna, 1 tablet, BID PRN  zinc oxide-cod liver oil, , Daily PRN        Labs/Studies: Reviewed.    Radiology Results: Reviewed    Quality Indicators      Hearing, Speech, and Vision  Ability to Hear: Adequate  Ability to See in Adequate Light: Adequate  Expression of Ideas and Wants: Without difficulty  Understanding Verbal and Non-Verbal Content: Understands    Cognitive Pattern Assessment  Cognitive Pattern Assessment Used: BIMS  Brief Interview for Mental Status (BIMS)  Repetition of Three Words (First Attempt): 3  Temporal Orientation: Year: Correct  Temporal Orientation: Month: Accurate within 5 days  Temporal Orientation: Day: Correct  Recall: Sock: Yes, no cue required  Recall: Blue: Yes, no cue required  Recall: Bed: Yes, no cue required  BIMS Summary Score: 15       Nutritional Approaches  Nutritional Approach: Feeding tube    ADLs  Admission Current   Eating Assistance Needed: Set-up / clean-up    CARE Score - 5 Assistance Needed: Set-up / clean-up    CARE Score - 5   Oral Hygiene  Assistance Needed: Set-up / clean-up    CARE Score - 5  Assistance Needed: Set-up / clean-up    CARE Score - 5   Bladder Continence Bowel Continence       Toileting Hygiene Assistance Needed: Physical assistance Total assistance  CARE Score - 1 Assistance Needed: Physical assistance    CARE Score - 1    Toilet Transfer Assistance Needed: Physical assistance Total assistance  CARE Score - 88 Assistance Needed: Physical assistance    CARE Score - 3   Shower/Bathe Self Assistance Needed: Physical assistance 26%-50%  CARE Score - 88  Assistance Needed: Physical assistance    CARE Score - 3   Upper Body Dressing Assistance Needed: Physical assistance 26%-50%  CARE Score - 3  Assistance Needed: Set-up / clean-up    CARE Score - 5   Lower Body Dressing Assistance Needed: Physical assistance Total assistance  CARE Score - 1  Assistance Needed: Physical assistance    CARE Score - 1   On/Off Footwear Assistance Needed: Physical assistance Total assistance  CARE Score - 1  Assistance Needed: Physical assistance    CARE Score - 1         Transfers Admission Current   Bed to Chair Assistance Needed: Physical assistance Total assistance  CARE Score - 1  Assistance Needed: Incidental touching, Adaptive equipment    CARE Score - 4     Lying to Sitting Assistance Needed: Physical assistance Total assistance  CARE Score - 1  Assistance Needed: Physical assistance    CARE Score - 3   Roll Left/Right Assistance Needed: Physical assistance 76% or more  CARE Score - 2  Assistance Needed: Physical assistance    CARE Score - 3   Sit to Lying Assistance Needed: Physical assistance Total assistance  CARE Score - 1  Assistance Needed: Physical assistance    CARE Score - 3   Sit to  Stand Assistance Needed: Physical assistance Total assistance  CARE Score - 1  Assistance Needed: Incidental touching, Adaptive equipment    CARE Score - 4         Mobility Admission Current   Walk 10 Feet Assistance Needed: Physical assistance Total assistance  CARE Score - 1 Assistance Needed: Incidental touching, Adaptive equipment    CARE Score - 4     Walk 50 Feet 2 Turns Assistance Needed: Physical assistance 25% or less  CARE Score - 88 Assistance Needed: Incidental touching, Adaptive equipment    CARE Score - 4   Walk 150 Feet Assistance Needed: Physical assistance 25% or less  CARE Score - 88  Assistance Needed: Incidental touching, Adaptive equipment    CARE Score - 4   Walk 10 Feet Uneven Assistance Needed: Physical assistance 25% or less  CARE Score - 88  Assistance Needed: Physical assistance    CARE Score - 3   1 Step (Curb) Assistance Needed: Incidental touching, Adaptive equipment    CARE Score - 88  Assistance Needed: Incidental touching, Adaptive equipment    CARE Score - 4   4 Steps Assistance Needed: Incidental touching, Adaptive equipment    CARE Score - 88  Assistance Needed: Incidental touching, Adaptive equipment    CARE Score - 4   12 Steps      CARE Score - 88       CARE Score - 88   Picking Up Object      CARE Score - 88       CARE Score - 88   Wheelchair/Scooter Use        Wheel 50 Feet 2 Turns Assistance Needed: Physical assistance Total assistance    CARE Score - Wheel 50 Feet with Two Turns: 1    Type of Wheelchair/Scooter: Manual Assistance Needed: Supervision      CARE Score - Wheel 50 Feet with Two Turns: 4    Type of Wheelchair/Scooter: Manual   Wheel 150 Feet Assistance Needed: Physical assistance Total assistance    CARE Score - Wheel 150 Feet: 1    Type of Wheelchair/Scooter: Manual  Assistance Needed: Physical assistance      CARE Score - Wheel 150 Feet: 3    Type of Wheelchair/Scooter: Manual

## 2022-12-01 NOTE — Unmapped (Addendum)
Patient is AOX4 . Complained of pain and nausea managed with dilaudid IV , oxycodone PO and phernegan IV. All safety measures in place. Plan of care maintained. Attempted lab draws but unsuccessful.  Problem: Rehabilitation (IRF) Plan of Care  Goal: Plan of Care Review  Outcome: Progressing  Goal: Patient-Specific Goal (Individualized)  Outcome: Progressing  Goal: Absence of New-Onset Illness or Injury  Outcome: Progressing  Intervention: Prevent Fall and Fall Injury  Recent Flowsheet Documentation  Taken 12/01/2022 0400 by Roderick Pee, RN  Safety Interventions: fall reduction program maintained  Taken 12/01/2022 0200 by Roderick Pee, RN  Safety Interventions: fall reduction program maintained  Taken 12/01/2022 0000 by Roderick Pee, RN  Safety Interventions: fall reduction program maintained  Taken 11/30/2022 2200 by Roderick Pee, RN  Safety Interventions: fall reduction program maintained  Taken 11/30/2022 2030 by Roderick Pee, RN  Safety Interventions: fall reduction program maintained  Intervention: Prevent Skin Injury  Recent Flowsheet Documentation  Taken 11/30/2022 2030 by Roderick Pee, RN  Skin Protection: incontinence pads utilized  Intervention: Prevent Infection  Recent Flowsheet Documentation  Taken 11/30/2022 2030 by Roderick Pee, RN  Infection Prevention: hand hygiene promoted  Goal: Optimal Comfort and Wellbeing  Outcome: Progressing  Goal: Home and Community Transition Plan Established  Outcome: Progressing  Goal: Rounds/Family Conference  Outcome: Progressing     Problem: Self-Care Deficit  Goal: Improved Ability to Complete Activities of Daily Living  Outcome: Progressing     Problem: Skin Injury Risk Increased  Goal: Skin Health and Integrity  Outcome: Progressing  Intervention: Optimize Skin Protection  Recent Flowsheet Documentation  Taken 11/30/2022 2200 by Roderick Pee, RN  Pressure Reduction Devices: chair cushion utilized  Taken 11/30/2022 2030 by Roderick Pee, RN  Pressure Reduction Techniques: frequent weight shift encouraged  Pressure Reduction Devices: chair cushion utilized  Skin Protection: incontinence pads utilized     Problem: Pain Acute  Goal: Optimal Pain Control and Function  Outcome: Progressing     Problem: Fall Injury Risk  Goal: Absence of Fall and Fall-Related Injury  Outcome: Progressing  Intervention: Promote Scientist, clinical (histocompatibility and immunogenetics) Documentation  Taken 12/01/2022 0400 by Roderick Pee, RN  Safety Interventions: fall reduction program maintained  Taken 12/01/2022 0200 by Roderick Pee, RN  Safety Interventions: fall reduction program maintained  Taken 12/01/2022 0000 by Roderick Pee, RN  Safety Interventions: fall reduction program maintained  Taken 11/30/2022 2200 by Roderick Pee, RN  Safety Interventions: fall reduction program maintained  Taken 11/30/2022 2030 by Roderick Pee, RN  Safety Interventions: fall reduction program maintained

## 2022-12-02 MED ADMIN — promethazine (PHENERGAN) 6.5 mg in sodium chloride (NS) 0.9 % 50 mL IVPB: 6.5 mg | INTRAVENOUS

## 2022-12-02 MED ADMIN — nirogacestat (OGSIVEO) tablet 150 mg: 150 mg | ORAL | @ 10:00:00 | Stop: 2022-12-26

## 2022-12-02 MED ADMIN — pantoprazole (Protonix) EC tablet 40 mg: 40 mg | ORAL | @ 02:00:00

## 2022-12-02 MED ADMIN — oxyCODONE (ROXICODONE) immediate release tablet 15 mg: 15 mg | ORAL | @ 14:00:00 | Stop: 2022-12-04

## 2022-12-02 MED ADMIN — diazePAM (VALIUM) tablet 2.5 mg: 2.5 mg | ORAL | @ 19:00:00

## 2022-12-02 MED ADMIN — carboxymethylcellulose sodium (REFRESH CELLUVISC) 1 % ophthalmic gel 1 drop: 1 [drp] | OPHTHALMIC | @ 23:00:00

## 2022-12-02 MED ADMIN — ondansetron (ZOFRAN) injection 4 mg: 4 mg | INTRAVENOUS | @ 14:00:00

## 2022-12-02 MED ADMIN — HYDROmorphone (PF) (DILAUDID) injection 0.5 mg: .5 mg | INTRAVENOUS | @ 16:00:00 | Stop: 2022-12-07

## 2022-12-02 MED ADMIN — diazePAM (VALIUM) tablet 2.5 mg: 2.5 mg | ORAL | @ 14:00:00

## 2022-12-02 MED ADMIN — buprenorphine-naloxone (SUBOXONE) 2-0.5 mg SL film 1 mg of buprenorphine: .5 | SUBLINGUAL | @ 02:00:00

## 2022-12-02 MED ADMIN — multivitamin with folic acid 400 mcg tablet 1 tablet: 1 | ORAL | @ 14:00:00

## 2022-12-02 MED ADMIN — diazePAM (VALIUM) tablet 5 mg: 5 mg | ORAL | @ 02:00:00

## 2022-12-02 MED ADMIN — nirogacestat (OGSIVEO) tablet 150 mg: 150 mg | ORAL | @ 23:00:00 | Stop: 2022-12-26

## 2022-12-02 MED ADMIN — pantoprazole (Protonix) EC tablet 40 mg: 40 mg | ORAL | @ 14:00:00

## 2022-12-02 MED ADMIN — buprenorphine-naloxone (SUBOXONE) 2-0.5 mg SL film 1 mg of buprenorphine: .5 | SUBLINGUAL | @ 14:00:00

## 2022-12-02 MED ADMIN — buprenorphine-naloxone (SUBOXONE) 2-0.5 mg SL film 1 mg of buprenorphine: .5 | SUBLINGUAL | @ 19:00:00

## 2022-12-02 MED ADMIN — carboxymethylcellulose sodium (REFRESH CELLUVISC) 1 % ophthalmic gel 1 drop: 1 [drp] | OPHTHALMIC | @ 14:00:00

## 2022-12-02 MED ADMIN — HYDROmorphone (PF) (DILAUDID) injection 0.5 mg: .5 mg | INTRAVENOUS | Stop: 2022-12-07

## 2022-12-02 MED ADMIN — HYDROmorphone (PF) (DILAUDID) injection 0.5 mg: .5 mg | INTRAVENOUS | @ 10:00:00 | Stop: 2022-12-07

## 2022-12-02 MED ADMIN — naloxegol (MOVANTIK) 12.5 mg tablet 12.5 mg: 12.5 mg | ORAL | @ 14:00:00

## 2022-12-02 MED ADMIN — promethazine (PHENERGAN) 6.5 mg in sodium chloride (NS) 0.9 % 50 mL IVPB: 6.5 mg | INTRAVENOUS | @ 22:00:00

## 2022-12-02 MED ADMIN — carboxymethylcellulose sodium (REFRESH CELLUVISC) 1 % ophthalmic gel 1 drop: 1 [drp] | OPHTHALMIC | @ 16:00:00

## 2022-12-02 MED ADMIN — famotidine (PEPCID) tablet 20 mg: 20 mg | ORAL | @ 02:00:00

## 2022-12-02 MED ADMIN — HYDROmorphone (PF) (DILAUDID) injection 0.5 mg: .5 mg | INTRAVENOUS | @ 23:00:00 | Stop: 2022-12-07

## 2022-12-02 NOTE — Unmapped (Signed)
Patient is A&O. Regular diet, set up with meals. Continent of bowel and bladder. No new signs of skin breakdown. Pain controlled with prn/scheduled meds. Non-tunneled line removed by MD. Skin and safety protocol maintained. Bed in lowest position, call bell within reach, wheels locked. Will continue to monitor.     Problem: Rehabilitation (IRF) Plan of Care  Goal: Plan of Care Review  Outcome: Progressing  Goal: Patient-Specific Goal (Individualized)  Outcome: Progressing  Goal: Absence of New-Onset Illness or Injury  Outcome: Progressing  Intervention: Prevent Fall and Fall Injury  Recent Flowsheet Documentation  Taken 12/01/2022 1800 by Earlie Counts, RN  Safety Interventions: fall reduction program maintained  Taken 12/01/2022 1600 by Earlie Counts, RN  Safety Interventions: fall reduction program maintained  Taken 12/01/2022 1400 by Earlie Counts, RN  Safety Interventions: fall reduction program maintained  Taken 12/01/2022 1200 by Earlie Counts, RN  Safety Interventions: fall reduction program maintained  Taken 12/01/2022 1000 by Earlie Counts, RN  Safety Interventions: fall reduction program maintained  Taken 12/01/2022 0800 by Earlie Counts, RN  Safety Interventions: fall reduction program maintained  Goal: Optimal Comfort and Wellbeing  Outcome: Progressing  Goal: Home and Community Transition Plan Established  Outcome: Progressing  Goal: Rounds/Family Conference  Outcome: Progressing

## 2022-12-02 NOTE — Unmapped (Signed)
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 Admitted to [facility] on 09/25/2022 for ileus, inability to tolerate PO now on supplemental tube feed, and diffuse pain due to muscle spasms of uncertain etiology.      Rehab Impairment Group Code [facility]): (Debility) 16 Debility (Non-Cardiac/Non-Pulmonary)  Etiology: Diffuse pain due to muscle spasms of uncertain etiology and cutaneous desmoid tumors    PLAN:     This patient is admitted to the Physical Medicine and Rehabilitation - Inpatient - C service from 8am-5pm on weekdays for questions regarding this patient. After hours, weekends, and holidays please contact the 1st Call resident pager     REHAB:   - PT and OT to maximize functional status with mobility and ADLs as well as prevention of joint contracture.   - Neuropsych for higher level cognitive evaluation and coping.  - RT for community re-integration, education, and leisure support services.  - Nutrition consult for diet information/teaching.   - To be discussed in weekly Interdisciplinary Team Conference.     Increased Tonicity  Dystonia   Started post-procedurally per patient ~09/25/22. Occurs intermittently throughout the day with a heightened occurrence during mobilization. At rest patient bilateral ankles in plantarflexion with worsening heel cord tightening. Has tonicity most prominent to HF and KE that is not velocity dependent. Has not noted significant improvement with increase of baclofen to 15 mg TID in setting of infection as below. Neurology previously evaluated patient seemingly ruling out an acute dystonic reaction, cord compression, central process, nutritional deficiency, stiff person syndrome and [name] after normal EMG on 8/22. Continues to be well controlled with valium, small improvement with baclofen PRN.  - Continue Baclofen 10 mg TID prn  - Continued Valium 2.5/2.5/5 mg TID and 2.5 mg nightly PRN  - PRAFO boots in bed     Headache  Currently managed with tylenol. She does have some posterior neck muscle tightness. Notes ketamine is helpful for headache pains. Describes photophobia and phonophobia with events. She does have loss of cervical lordosis on most recent MRI.   - Consider starting propranolol 5 mg BID for headache prophylaxis.   - PRN tylenol +/- prn fiorcet   - Lidocaine cream PRN to area of pain for cervicogenic headaches     Pain  Pain management consulted with recommendations as below. Patient will need to follow up with outpatient chronic pain provider ([name]). Gabapentin discontinued 9/17 and will not be continued upon discharge. Removed central line 9/23 and placed PIV. Discussed replacing peripheral line if needed vs transitioning to all oral medications. Will continue PIV for now.   - Tylenol 650 mg q6h PRN  - IV Dilaudid 0.5 mg q6h PRN; will wean as tolerated  - Oxyocodone 10-15 mg q4h PRN  - Suboxone 2-0.5 mg TID  - Voltaren gel QID, lidocaine patches  - Continue patellar taping with therapy or obtain patellar brace to help with pain with ambulation  - Continue Movantik 12.5mg  daily; discontinue upon discharge from [facility]    Anasarca  Patient noted increased lower extremity, facial, and abdominal swelling prior to admission to [facility]. Reported that pitting edema was present when taking Lyrica. Gabapentin discontinued 9/17. No current medications with significant side effects of swelling. Requested IV Lasix as this was helpful in  the past. Noted that some edema could be related to gabapentin, will plan to monitor and discontinue as indicated. On exam, patient with mild edema in bilateral feet, not appreciated elsewhere on exam. Noted improvement in edema with IV Lasix. Will continue to monitor daily and dose as needed. Oral Bumex trial 9/18 modestly helpful.    - Bumex 0.5mg  x1 9/18. Lasix IV 10mg  x1 9/19. Lasix IV 10mg  x1 9/20  - Daily standing weight  - Increase protein intake per dietician     Multifactorial Ileus   Nausea (chronic)  [employer] Emesis 2/2 NGT placement (resolved)  Contributors with history of narcotics, poor PO intake. Concern for malnutrition. Improvements to abdominal pain since weaning opioids. TPN has been discontinued with patient now tolerating tube feeds. Patient with chronic nausea that was previously managed with Zofran, though transitioned to phenergan due to prolonged Qtc. Upon admission to [facility], continued nightly tube feeds with Corpak NG tube in place, and PO intake as tolerated during the day. Will continue to decrease tube feeds with goal of discharge from [facility] on full regular diet. If unable to tolerate full PO diet, will consider discharge home with corpak. Patient noted weight gain upon arrival to AIR (felt likely fluid retention) as weight was increased in a short period of time.   - Dietician following; appreciate care and recommendations  - Continue nightly tube feeds  - Continue PO regular diet + protein supplementation; anticipate NG tube removal prior to discharge   - Calorie count  - Daily standing weights   - Continue Protonix 40mg  BID  - Continue IV phenergan 6.5mg  Q6 PRN; Scopolamine patch ordered 9/19 in hopes of transitioning off IV  [ ]  restart home cefdinir and doxycycline upon discharge from AIR for SIBP     Syncope  Multifactorial. Primary team ddx includes: vasovagal vs adverse effect of medications vs deconditioning vs iron deficiency. Last echo 04/2022. Stable upon admission to [facility].   - CTM     IDA  Baseline hemoglobin ~9-10. Stable upon admission to AIR at 9.8. Will continue to monitor while admitted.   - M/Th CBC  - Has allergy to formula iron      [name] - Desmoid Tumors  Follows [facility] (Dr. [name]) is patient's outpatient oncologist. Per oncology, most common side effects of the chemotherapy are diarrhea, nausea, headache, and rash. Patient has not had increase in baseline nausea while taking the medication and increased dose as planned on 9/17. Will continue to monitor and discuss with outpatient provider as indicated. Noted some dry mouth and skin sensitivity on 9/24, patient reported similar symptoms with chemotherapy in the past.   - Nirogacestat 150mg  BID; increased 9/17  - M/Th CMP; monitor LFT  - START ammonium lactate lotion PRN 9/24  - START biotene spray PRN 9/24     Enterobacteriaceae group bacteremia (Resolved) - C albicans fungemia (Resolved)  Likely due to RIJ tunneled line placed 01/2022 now removed on 8/14.  8/13, 8/17 repeat blood cultures NGTD.  No evidence of vitreitis, retinitis 8/15 per Ophtho.  TTE 8/15 w possible vegetations on MV and TV or indwelling catheter; however, TEE 8/20 w/o vegetations. ID consulted during acute admission and now s/p treatment with cephalosporin and fluconazole on 8/31. Patient noted to have difficult IV access and kept R IJ access upon admission to [facility]. Placed peripheral IV on 9/23 and removed central line.      Generalized anxiety disorder, depression, h/o panic attacks  Psychiatry consulted during acute  hospitalization with recommendations as below.   - Consider decreasing mirtazapine 15 mg nightly to 7.5 mg nightly due to somnolence  - Valium as above, if needed could give 2.5mg  PRN for acute anxiety  - Neuropsychology consulted; appreciate care     Daily Checklist  - Diet: Regular diet + nighttime tube feeds  - DVT PPX: SQ enoxaparin; patient refused   - GI PPX: Protonix  - Access: R IJ non tunneled CVC. Necessary for IV medications; will remove when able.    DISPO: Patient to be discussed at weekly interdisciplinary team conference.   - EDD: 12/10/2022  - Follow-up: PCP, Chronic Pain, Oncology (Dr. [name]), Psych    SUBJECTIVE and PROGRESS WITH FUNCTIONAL ACTIVITIES:     Interval events:  NAEON. Removed central line at the bedside yesterday without difficulty. Patient seen this morning and doing well. Noted some skin sensitivity and dry mouth, likely secondary to chemotherapy. Will start PRN biotene spray and ammonium lactate lotion (patient used previously with good response).     OBJECTIVE:     Vital signs (last 24 hours):  Temp:  [36.5 ??C (97.7 ??F)-36.7 ??C (98.1 ??F)] 36.7 ??C (98.1 ??F)  Heart Rate:  [85-105] 105  Resp:  [18] 18  BP: (97-108)/(55-58) 105/56  MAP (mmHg):  [67-73] 72  SpO2:  [96 %-100 %] 96 %    Intake/Output (last 3 shifts):  I/O last 3 completed shifts:  In: 30 [P.O.:30]  Out: 150 [Urine:150]    Physical Exam:   GEN: Sitting up in bed in no acute distress  EYES: sclera anicteric, conjunctiva clear   HEENT: Corpak in place L nare   CV: Warm and well perfused, trace edema in bilateral feet (improved)  RESP:  NWB on 2LNC  ABD: Not distended  SKIN: Central line site without erythema, bandage in place clean/dry/intact  MSK: Not visualized today, previously sacrum with some soft tissue swelling near lumbosacral junction   NEURO: Alert, speech fluent  PSYCH: Mood euthymic, affect appropriate    Medications:  Scheduled    buprenorphine-naloxone (SUBOXONE) 2-0.5 mg SL film 1 mg of buprenorphine TID    carboxymethylcellulose sodium (REFRESH CELLUVISC) 1 % ophthalmic gel 1 drop QID    diazePAM (VALIUM) tablet 2.5 mg Daily    And    diazePAM (VALIUM) tablet 2.5 mg Daily    diazePAM (VALIUM) tablet 5 mg Nightly    diclofenac sodium (VOLTAREN) 1 % gel 2 g QID    [Provider Hold] enoxaparin (LOVENOX) syringe 40 mg Q24H SCH    famotidine (PEPCID) tablet 20 mg Nightly    lidocaine (ASPERCREME) 4 % 2 patch Daily    mirtazapine (REMERON) tablet 15 mg Nightly    multivitamin with folic acid 400 mcg tablet 1 tablet Daily    naloxegol (MOVANTIK) 12.5 mg tablet 12.5 mg Daily    nirogacestat (OGSIVEO) tablet 150 mg BID    pantoprazole (Protonix) EC tablet 40 mg BID    scopolamine (TRANSDERM-SCOP) 1 mg over 3 days topical patch 1 mg Q72H     PRN acetaminophen, 650 mg, Q6H PRN  alum-mag-simeth, 60 mL, Q6H PRN  baclofen, 10 mg, TID PRN  bisacodyl, 10 mg, BID PRN  butalbital-acetaminophen-caffeine, 1 tablet, BID PRN  carboxymethylcellulose sodium, 2 drop, QID PRN  Chemo Clarification Order, , Continuous PRN  Chemo Clarification Order, , Continuous PRN  diazePAM, 2.5 mg, Nightly PRN  diphenhydrAMINE, 25 mg, Q6H PRN  guaiFENesin, 200 mg, Q4H PRN  hydrocortisone, , BID PRN  HYDROmorphone, 0.5 mg, Q6H  PRN  IP okay to treat, , Continuous PRN  lidocaine-diphenhydrAMINE-aluminum-magnesium, 10 mL, QID PRN  ondansetron, 4 mg, Q12H PRN  oxyCODONE, 10 mg, Q4H PRN   Or  oxyCODONE, 15 mg, Q4H PRN  phenol, 2 spray, Q2H PRN  polyethylene glycol, 17 g, BID PRN  promethazine, 6.5 mg, Q6H PRN  pseudoephedrine, 30 mg, Daily PRN  senna, 1 tablet, BID PRN  zinc oxide-cod liver oil, , Daily PRN        Labs/Studies: Reviewed.    Radiology Results: Reviewed    Quality Indicators      Hearing, Speech, and Vision  Ability to Hear: Adequate  Ability to See in Adequate Light: Adequate  Expression of Ideas and Wants: Without difficulty  Understanding Verbal and Non-Verbal Content: Understands    Cognitive Pattern Assessment  Cognitive Pattern Assessment Used: BIMS  Brief Interview for Mental Status (BIMS)  Repetition of Three Words (First Attempt): 3  Temporal Orientation: Year: Correct  Temporal Orientation: Month: Accurate within 5 days  Temporal Orientation: Day: Correct  Recall: Sock: Yes, no cue required  Recall: Blue: Yes, no cue required  Recall: Bed: Yes, no cue required  BIMS Summary Score: 15       Nutritional Approaches  Nutritional Approach: Feeding tube    ADLs  Admission Current   Eating Assistance Needed: Set-up / clean-up    CARE Score - 5 Assistance Needed: Set-up / clean-up    CARE Score - 5   Oral Hygiene  Assistance Needed: Set-up / clean-up    CARE Score - 5  Assistance Needed: Set-up / clean-up    CARE Score - 5   Bladder Continence Bowel Continence       Toileting Hygiene Assistance Needed: Physical assistance Total assistance  CARE Score - 1 Assistance Needed: Incidental touching    CARE Score - 4    Toilet Transfer Assistance Needed: Physical assistance Total assistance  CARE Score - 88 Assistance Needed: Incidental touching    CARE Score - 4   Shower/Bathe Self Assistance Needed: Physical assistance 26%-50%  CARE Score - 88  Assistance Needed: Physical assistance    CARE Score - 3   Upper Body Dressing Assistance Needed: Physical assistance 26%-50%  CARE Score - 3  Assistance Needed: Set-up / clean-up    CARE Score - 5   Lower Body Dressing Assistance Needed: Physical assistance Total assistance  CARE Score - 1  Assistance Needed: Physical assistance    CARE Score - 1   On/Off Footwear Assistance Needed: Physical assistance Total assistance  CARE Score - 1  Assistance Needed: Physical assistance    CARE Score - 1         Transfers Admission Current   Bed to Chair Assistance Needed: Physical assistance Total assistance  CARE Score - 1  Assistance Needed: Supervision    CARE Score - 4     Lying to Sitting Assistance Needed: Physical assistance Total assistance  CARE Score - 1  Assistance Needed: Physical assistance    CARE Score - 3   Roll Left/Right Assistance Needed: Physical assistance 76% or more  CARE Score - 2  Assistance Needed: Physical assistance    CARE Score - 3   Sit to Lying Assistance Needed: Physical assistance Total assistance  CARE Score - 1  Assistance Needed: Physical assistance    CARE Score - 3   Sit to Stand Assistance Needed: Physical assistance Total assistance  CARE Score - 1  Assistance Needed: Supervision    CARE  Score - 4         Mobility Admission Current   Walk 10 Feet Assistance Needed: Physical assistance Total assistance  CARE Score - 1 Assistance Needed: Incidental touching, Adaptive equipment    CARE Score - 4     Walk 50 Feet 2 Turns Assistance Needed: Physical assistance 25% or less  CARE Score - 88 Assistance Needed: Incidental touching, Adaptive equipment    CARE Score - 4   Walk 150 Feet Assistance Needed: Physical assistance 25% or less  CARE Score - 88  Assistance Needed: Incidental touching, Adaptive equipment    CARE Score - 4   Walk 10 Feet Uneven Assistance Needed: Physical assistance 25% or less  CARE Score - 88  Assistance Needed: Physical assistance    CARE Score - 3   1 Step (Curb) Assistance Needed: Incidental touching, Adaptive equipment    CARE Score - 88  Assistance Needed: Incidental touching, Adaptive equipment    CARE Score - 4   4 Steps Assistance Needed: Incidental touching, Adaptive equipment    CARE Score - 88  Assistance Needed: Incidental touching, Adaptive equipment    CARE Score - 4   12 Steps      CARE Score - 88       CARE Score - 88   Picking Up Object      CARE Score - 88       CARE Score - 88   Wheelchair/Scooter Use        Wheel 50 Feet 2 Turns Assistance Needed: Physical assistance Total assistance    CARE Score - Wheel 50 Feet with Two Turns: 1    Type of Wheelchair/Scooter: Manual Assistance Needed: Independent      CARE Score - Wheel 50 Feet with Two Turns: 6    Type of Wheelchair/Scooter: Manual   Wheel 150 Feet Assistance Needed: Physical assistance Total assistance    CARE Score - Wheel 150 Feet: 1    Type of Wheelchair/Scooter: Manual  Assistance Needed: Independent      CARE Score - Wheel 150 Feet: 6    Type of Wheelchair/Scooter: Manual

## 2022-12-02 NOTE — Unmapped (Addendum)
Patient is alert and oriented. Complained of pain managed with dilaudid, PIV intact. All safety and skin care measures in place. Plan of care maintained  Problem: Rehabilitation ([facility]) Plan of Care  Goal: Plan of Care Review  Outcome: Progressing  Goal: Patient-Specific Goal (Individualized)  Outcome: Progressing  Goal: Absence of New-Onset Illness or Injury  Outcome: Progressing  Intervention: Prevent Fall and Fall Injury  Recent Flowsheet Documentation  Taken 12/02/2022 0000 by Roderick Pee, RN  Safety Interventions: fall reduction program maintained  Taken 12/01/2022 2200 by Roderick Pee, RN  Safety Interventions: fall reduction program maintained  Taken 12/01/2022 1930 by Roderick Pee, RN  Safety Interventions: fall reduction program maintained  Intervention: Prevent Infection  Recent Flowsheet Documentation  Taken 12/01/2022 1930 by Roderick Pee, RN  Infection Prevention: hand hygiene promoted  Goal: Optimal Comfort and Wellbeing  Outcome: Progressing  Goal: Home and Community Transition Plan Established  Outcome: Progressing  Goal: Rounds/Family Conference  Outcome: Progressing     Problem: Self-Care Deficit  Goal: Improved Ability to Complete Activities of Daily Living  Outcome: Progressing     Problem: Skin Injury Risk Increased  Goal: Skin Health and Integrity  Outcome: Progressing  Intervention: Optimize Skin Protection  Recent Flowsheet Documentation  Taken 12/02/2022 0000 by Roderick Pee, RN  Pressure Reduction Devices: chair cushion utilized  Taken 12/01/2022 2200 by Roderick Pee, RN  Head of Bed [facility]) Positioning: HOB elevated  Pressure Reduction Devices: chair cushion utilized  Taken 12/01/2022 1930 by Roderick Pee, RN  Pressure Reduction Techniques: frequent weight shift encouraged  Pressure Reduction Devices: chair cushion utilized     Problem: Pain Acute  Goal: Optimal Pain Control and Function  Outcome: Progressing     Problem: Fall Injury Risk  Goal: Absence of Fall and Fall-Related Injury  Outcome: Progressing  Intervention: Promote Scientist, clinical (histocompatibility and immunogenetics) Documentation  Taken 12/02/2022 0000 by Roderick Pee, RN  Safety Interventions: fall reduction program maintained  Taken 12/01/2022 2200 by Roderick Pee, RN  Safety Interventions: fall reduction program maintained  Taken 12/01/2022 1930 by Roderick Pee, RN  Safety Interventions: fall reduction program maintained

## 2022-12-03 MED ADMIN — ammonium lactate (LAC-HYDRIN) 12 % lotion 1 Application: 1 | TOPICAL | @ 13:00:00

## 2022-12-03 MED ADMIN — carboxymethylcellulose sodium (REFRESH CELLUVISC) 1 % ophthalmic gel 1 drop: 1 [drp] | OPHTHALMIC | @ 13:00:00

## 2022-12-03 MED ADMIN — famotidine (PEPCID) tablet 20 mg: 20 mg | ORAL | @ 02:00:00

## 2022-12-03 MED ADMIN — multivitamin with folic acid 400 mcg tablet 1 tablet: 1 | ORAL | @ 13:00:00

## 2022-12-03 MED ADMIN — scopolamine (TRANSDERM-SCOP) 1 mg over 3 days topical patch 1 mg: 1 | TOPICAL | @ 22:00:00

## 2022-12-03 MED ADMIN — buprenorphine-naloxone (SUBOXONE) 2-0.5 mg SL film 1 mg of buprenorphine: .5 | SUBLINGUAL | @ 18:00:00

## 2022-12-03 MED ADMIN — nirogacestat (OGSIVEO) tablet 150 mg: 150 mg | ORAL | @ 11:00:00 | Stop: 2022-12-26

## 2022-12-03 MED ADMIN — nirogacestat (OGSIVEO) tablet 150 mg: 150 mg | ORAL | @ 23:00:00 | Stop: 2022-12-26

## 2022-12-03 MED ADMIN — diclofenac sodium (VOLTAREN) 1 % gel 2 g: 2 g | TOPICAL | @ 13:00:00

## 2022-12-03 MED ADMIN — HYDROmorphone (PF) (DILAUDID) injection 0.5 mg: .5 mg | INTRAVENOUS | @ 22:00:00 | Stop: 2022-12-07

## 2022-12-03 MED ADMIN — ammonium lactate (LAC-HYDRIN) 12 % lotion 1 Application: 1 | TOPICAL | @ 02:00:00

## 2022-12-03 MED ADMIN — ondansetron (ZOFRAN) injection 4 mg: 4 mg | INTRAVENOUS | @ 16:00:00

## 2022-12-03 MED ADMIN — HYDROmorphone (PF) (DILAUDID) injection 0.5 mg: .5 mg | INTRAVENOUS | @ 16:00:00 | Stop: 2022-12-07

## 2022-12-03 MED ADMIN — ondansetron (ZOFRAN) injection 4 mg: 4 mg | INTRAVENOUS | @ 03:00:00

## 2022-12-03 MED ADMIN — diazePAM (VALIUM) tablet 5 mg: 5 mg | ORAL | @ 02:00:00

## 2022-12-03 MED ADMIN — HYDROmorphone (PF) (DILAUDID) injection 0.5 mg: .5 mg | INTRAVENOUS | @ 03:00:00 | Stop: 2022-12-07

## 2022-12-03 MED ADMIN — buprenorphine-naloxone (SUBOXONE) 2-0.5 mg SL film 1 mg of buprenorphine: .5 | SUBLINGUAL | @ 13:00:00

## 2022-12-03 MED ADMIN — HYDROmorphone (PF) (DILAUDID) injection 0.5 mg: .5 mg | INTRAVENOUS | @ 11:00:00 | Stop: 2022-12-07

## 2022-12-03 MED ADMIN — buprenorphine-naloxone (SUBOXONE) 2-0.5 mg SL film 1 mg of buprenorphine: .5 | SUBLINGUAL | @ 02:00:00

## 2022-12-03 MED ADMIN — diazePAM (VALIUM) tablet 2.5 mg: 2.5 mg | ORAL | @ 18:00:00

## 2022-12-03 MED ADMIN — diazePAM (VALIUM) tablet 2.5 mg: 2.5 mg | ORAL | @ 13:00:00

## 2022-12-03 MED ADMIN — pantoprazole (Protonix) EC tablet 40 mg: 40 mg | ORAL | @ 02:00:00

## 2022-12-03 MED ADMIN — promethazine (PHENERGAN) 6.5 mg in sodium chloride (NS) 0.9 % 50 mL IVPB: 6.5 mg | INTRAVENOUS | @ 11:00:00

## 2022-12-03 MED ADMIN — pantoprazole (Protonix) EC tablet 40 mg: 40 mg | ORAL | @ 13:00:00

## 2022-12-03 MED ADMIN — naloxegol (MOVANTIK) 12.5 mg tablet 12.5 mg: 12.5 mg | ORAL | @ 13:00:00

## 2022-12-03 NOTE — Unmapped (Signed)
Discharge Services and Resources:    Home Health Services:  Home Health has been arranged with [facility] at [phone].  Start of Care for Home Health will be within 48 hours of discharge. If you have not been contacted by the Home Health agency prior to your start of care, please call the above number regarding services arranged for you.     Home Medical Equipment:     [facility]  [phone]    If you have any questions regarding your medical equipment after discharge, please call the above number.       Disability Parking Placard  You have received an application for a disability parking placard that you can obtain at your local [facility] for $5.

## 2022-12-03 NOTE — Unmapped (Signed)
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 Admitted to [facility] on 09/25/2022 for ileus, inability to tolerate PO now on supplemental tube feed, and diffuse pain due to muscle spasms of uncertain etiology.      Rehab Impairment Group Code [facility]): (Debility) 16 Debility (Non-Cardiac/Non-Pulmonary)  Etiology: Diffuse pain due to muscle spasms of uncertain etiology and cutaneous desmoid tumors    PLAN:     This patient is admitted to the Physical Medicine and Rehabilitation - Inpatient - C service from 8am-5pm on weekdays for questions regarding this patient. After hours, weekends, and holidays please contact the 1st Call resident pager     REHAB:   - PT and OT to maximize functional status with mobility and ADLs as well as prevention of joint contracture.   - Neuropsych for higher level cognitive evaluation and coping.  - RT for community re-integration, education, and leisure support services.  - Nutrition consult for diet information/teaching.   - To be discussed in weekly Interdisciplinary Team Conference.     Increased Tonicity  Dystonia   Started post-procedurally per patient ~09/25/22. Occurs intermittently throughout the day with a heightened occurrence during mobilization. At rest patient bilateral ankles in plantarflexion with worsening heel cord tightening. Has tonicity most prominent to HF and KE that is not velocity dependent. Has not noted significant improvement with increase of baclofen to 15 mg TID in setting of infection as below. Neurology previously evaluated patient seemingly ruling out an acute dystonic reaction, cord compression, central process, nutritional deficiency, stiff person syndrome and [name] after normal EMG on 8/22. Continues to be well controlled with valium, small improvement with baclofen PRN.  - Continue Baclofen 10 mg TID prn  - Continued Valium 2.5/2.5/5 mg TID and 2.5 mg nightly PRN  - PRAFO boots in bed     Headache  Currently managed with tylenol. She does have some posterior neck muscle tightness. Notes ketamine is helpful for headache pains. Describes photophobia and phonophobia with events. She does have loss of cervical lordosis on most recent MRI.   - Consider starting propranolol 5 mg BID for headache prophylaxis.   - PRN tylenol +/- prn fiorcet   - Lidocaine cream PRN to area of pain for cervicogenic headaches     Pain  Pain management consulted with recommendations as below. Patient will need to follow up with outpatient chronic pain provider ([name]). Gabapentin discontinued 9/17 and will not be continued upon discharge. Removed central line 9/23 and placed PIV. Discussed replacing peripheral line if needed vs transitioning to all oral medications. Will continue PIV for now.   - Tylenol 650 mg q6h PRN  - IV Dilaudid 0.5 mg q6h PRN; will wean as tolerated  - Oxyocodone 10-15 mg q4h PRN  - Suboxone 2-0.5 mg TID  - Voltaren gel QID, lidocaine patches  - Continue patellar taping with therapy or obtain patellar brace to help with pain with ambulation  - Continue Movantik 12.5mg  daily; discontinue upon discharge from AIR    Anasarca  Patient noted increased lower extremity, facial, and abdominal swelling prior to admission to AIR. Reported that pitting edema was present when taking Lyrica. Gabapentin discontinued 9/17. No current medications with significant side effects of swelling. Requested IV Lasix as this was helpful in  the past. Noted that some edema could be related to gabapentin, will plan to monitor and discontinue as indicated. On exam, patient with mild edema in bilateral feet, not appreciated elsewhere on exam. Noted improvement in edema with IV Lasix. Will continue to monitor daily and dose as needed. Oral Bumex trial 9/18 modestly helpful.    - Bumex 0.5mg  x1 9/18. Lasix IV 10mg  x1 9/19. Lasix IV 10mg  x1 9/20  - Daily standing weight  - Increase protein intake per dietician     Multifactorial Ileus   Nausea (chronic)  [place] Emesis 2/2 NGT placement (resolved)  Contributors with history of narcotics, poor PO intake. Concern for malnutrition. Improvements to abdominal pain since weaning opioids. TPN has been discontinued with patient now tolerating tube feeds. Patient with chronic nausea that was previously managed with Zofran, though transitioned to phenergan due to prolonged Qtc. Upon admission to AIR, continued nightly tube feeds with Corpak NG tube in place, and PO intake as tolerated during the day. Will continue to decrease tube feeds with goal of discharge from AIR on full regular diet. If unable to tolerate full PO diet, will consider discharge home with corpak. Patient noted weight gain upon arrival to AIR (felt likely fluid retention) as weight was increased in a short period of time.   - Dietician following; appreciate care and recommendations  - Continue nightly tube feeds  - Continue PO regular diet + protein supplementation; anticipate NG tube removal prior to discharge   - Calorie count  - Daily standing weights   - Continue Protonix 40mg  BID  - Continue IV phenergan 6.5mg  Q6 PRN; Scopolamine patch ordered 9/19 in hopes of transitioning off IV  [ ]  restart home cefdinir and doxycycline upon discharge from AIR for SIBP     Syncope  Multifactorial. Primary team ddx includes: vasovagal vs adverse effect of medications vs deconditioning vs iron deficiency. Last echo 04/2022. Stable upon admission to AIR.   - CTM     IDA  Baseline hemoglobin ~9-10. Stable upon admission to AIR at 9.8. Will continue to monitor while admitted.   - M/Th CBC  - Has allergy to formula iron      [name] - Desmoid Tumors  Follows [facility] (Dr. [name]) is patient's outpatient oncologist. Per oncology, most common side effects of the chemotherapy are diarrhea, nausea, headache, and rash. Patient has not had increase in baseline nausea while taking the medication and increased dose as planned on 9/17. Will continue to monitor and discuss with outpatient provider as indicated. Noted some dry mouth and skin sensitivity on 9/24, patient reported similar symptoms with chemotherapy in the past. Started PRN ammonium lactate lotion, biotene spray, and mucositis mixture as needed.   - Nirogacestat 150mg  BID; increased 9/17  - M/Th CMP; monitor LFT  - Ammonium lactate lotion PRN 9/24  - Biotene spray PRN 9/24  - mucositis swish/spit PRN     Enterobacteriaceae group bacteremia (Resolved) - C albicans fungemia (Resolved)  Likely due to RIJ tunneled line placed 01/2022 now removed on 8/14.  8/13, 8/17 repeat blood cultures NGTD.  No evidence of vitreitis, retinitis 8/15 per Ophtho.  TTE 8/15 w possible vegetations on MV and TV or indwelling catheter; however, TEE 8/20 w/o vegetations. ID consulted during acute admission and now s/p treatment with cephalosporin and fluconazole on 8/31. Patient noted to have difficult IV access and kept R IJ access upon admission to AIR. Placed peripheral IV on 9/23 and removed central line.  Generalized anxiety disorder, depression, h/o panic attacks  Psychiatry consulted during acute hospitalization with recommendations as below.   - Consider decreasing mirtazapine 15 mg nightly to 7.5 mg nightly due to somnolence  - Valium as above, if needed could give 2.5mg  PRN for acute anxiety  - Neuropsychology consulted; appreciate care     Daily Checklist  - Diet: Regular diet + nighttime tube feeds  - DVT PPX: SQ enoxaparin; patient refused   - GI PPX: Protonix  - Access: R IJ non tunneled CVC. Necessary for IV medications; will remove when able.    DISPO: Patient to be discussed at weekly interdisciplinary team conference.   - EDD: 12/10/2022  - Follow-up: PCP, Chronic Pain, Oncology (Dr. [name]), Psych    SUBJECTIVE and PROGRESS WITH FUNCTIONAL ACTIVITIES:     Interval events:  NAEON. Patient reported some red in her stool this morning and noted that this is typical of her previous stomach ulcers. Continuing on protonix and famotidine. Will continue to follow hemoglobin (has been steadily rising). Noted that she was more fatigued today after having many therapy sessions in a row yesterday.    OBJECTIVE:     Vital signs (last 24 hours):  Temp:  [36.7 ??C (98.1 ??F)-36.9 ??C (98.4 ??F)] 36.7 ??C (98.1 ??F)  Heart Rate:  [90-103] 103  Resp:  [18] 18  BP: (103-134)/(60-72) 103/60  MAP (mmHg):  [70-89] 70  SpO2:  [99 %-100 %] 99 %    Intake/Output (last 3 shifts):  I/O last 3 completed shifts:  In: 3606.2 [P.O.:60; I.V.:2996.2; NG/GT:550]  Out: 0     Physical Exam:   GEN: Sitting comfortably in recliner in no acute distress  EYES: sclera anicteric, conjunctiva clear   HEENT: Corpak in place L nare   CV: Warm and well perfused, trace edema in bilateral feet (improved)  RESP:  NWB on RA with [place] in place  ABD: Not distended  SKIN: Central line site without erythema, bandage in place clean/dry/intact  MSK: Not visualized today, previously sacrum with some soft tissue swelling near lumbosacral junction   NEURO: Alert, speech fluent  PSYCH: Mood euthymic, affect appropriate    Medications:  Scheduled    ammonium lactate (LAC-HYDRIN) 12 % lotion 1 Application BID    buprenorphine-naloxone (SUBOXONE) 2-0.5 mg SL film 1 mg of buprenorphine TID    carboxymethylcellulose sodium (REFRESH CELLUVISC) 1 % ophthalmic gel 1 drop QID    diazePAM (VALIUM) tablet 2.5 mg Daily    And    diazePAM (VALIUM) tablet 2.5 mg Daily    diazePAM (VALIUM) tablet 5 mg Nightly    diclofenac sodium (VOLTAREN) 1 % gel 2 g QID    [Provider Hold] enoxaparin (LOVENOX) syringe 40 mg Q24H SCH    famotidine (PEPCID) tablet 20 mg Nightly    flu vaccine TS 2024-25(43mos up)(PF)(FLULAVAL, FLUARIX, FLUZONE) During hospitalization    lidocaine (ASPERCREME) 4 % 2 patch Daily    mirtazapine (REMERON) tablet 15 mg Nightly    multivitamin with folic acid 400 mcg tablet 1 tablet Daily    naloxegol (MOVANTIK) 12.5 mg tablet 12.5 mg Daily    nirogacestat (OGSIVEO) tablet 150 mg BID    pantoprazole (Protonix) EC tablet 40 mg BID    scopolamine (TRANSDERM-SCOP) 1 mg over 3 days topical patch 1 mg Q72H     PRN acetaminophen, 650 mg, Q6H PRN  alum-mag-simeth, 60 mL, Q6H PRN  baclofen, 10 mg, TID PRN  bisacodyl, 10 mg, BID PRN  butalbital-acetaminophen-caffeine, 1 tablet, BID PRN  carboxymethylcellulose sodium, 2 drop, QID PRN  Chemo Clarification Order, , Continuous PRN  Chemo Clarification Order, , Continuous PRN  diazePAM, 2.5 mg, Nightly PRN  diphenhydrAMINE, 25 mg, Q6H PRN  guaiFENesin, 200 mg, Q4H PRN  hydrocortisone, , BID PRN  HYDROmorphone, 0.5 mg, Q6H PRN  IP okay to treat, , Continuous PRN  lidocaine-diphenhydrAMINE-aluminum-magnesium, 10 mL, QID PRN  ondansetron, 4 mg, Q12H PRN  oxyCODONE, 10 mg, Q4H PRN   Or  oxyCODONE, 15 mg, Q4H PRN  phenol, 2 spray, Q2H PRN  polyethylene glycol, 17 g, BID PRN  promethazine, 6.5 mg, Q6H PRN  pseudoephedrine, 30 mg, Daily PRN  saliva stimulant comb. no.3, 1 spray, Q2H PRN  senna, 1 tablet, BID PRN  zinc oxide-cod liver oil, , Daily PRN        Labs/Studies: Reviewed.    Radiology Results: Reviewed    Quality Indicators      Hearing, Speech, and Vision  Ability to Hear: Adequate  Ability to See in Adequate Light: Adequate  Expression of Ideas and Wants: Without difficulty  Understanding Verbal and Non-Verbal Content: Understands    Cognitive Pattern Assessment  Cognitive Pattern Assessment Used: BIMS  Brief Interview for Mental Status (BIMS)  Repetition of Three Words (First Attempt): 3  Temporal Orientation: Year: Correct  Temporal Orientation: Month: Accurate within 5 days  Temporal Orientation: Day: Correct  Recall: Sock: Yes, no cue required  Recall: Blue: Yes, no cue required  Recall: Bed: Yes, no cue required  BIMS Summary Score: 15       Nutritional Approaches  Nutritional Approach: Feeding tube    ADLs  Admission Current   Eating Assistance Needed: Set-up / clean-up    CARE Score - 5 Assistance Needed: Independent    CARE Score - 6   Oral Hygiene  Assistance Needed: Set-up / clean-up    CARE Score - 5  Assistance Needed: Independent    CARE Score - 6   Bladder Continence       Bowel Continence       Toileting Hygiene Assistance Needed: Physical assistance Total assistance  CARE Score - 1 Assistance Needed: Incidental touching    CARE Score - 4    Toilet Transfer Assistance Needed: Physical assistance Total assistance  CARE Score - 88 Assistance Needed: Incidental touching, Supervision    CARE Score - 4   Shower/Bathe Self Assistance Needed: Physical assistance 26%-50%  CARE Score - 88  Assistance Needed: Physical assistance 26%-50%  CARE Score - 3   Upper Body Dressing Assistance Needed: Physical assistance 26%-50%  CARE Score - 3  Assistance Needed: Set-up / clean-up    CARE Score - 5   Lower Body Dressing Assistance Needed: Physical assistance Total assistance  CARE Score - 1  Assistance Needed: Incidental touching    CARE Score - 4   On/Off Footwear Assistance Needed: Physical assistance Total assistance  CARE Score - 1  Assistance Needed: Physical assistance Total assistance  CARE Score - 1         Transfers Admission Current   Bed to Chair Assistance Needed: Physical assistance Total assistance  CARE Score - 1  Assistance Needed: Supervision    CARE Score - 4     Lying to Sitting Assistance Needed: Physical assistance Total assistance  CARE Score - 1  Assistance Needed: Physical assistance    CARE Score - 3   Roll Left/Right Assistance Needed: Physical assistance 76% or more  CARE Score - 2  Assistance Needed: Physical assistance  CARE Score - 3   Sit to Lying Assistance Needed: Physical assistance Total assistance  CARE Score - 1  Assistance Needed: Physical assistance    CARE Score - 3   Sit to Stand Assistance Needed: Physical assistance Total assistance  CARE Score - 1  Assistance Needed: Supervision    CARE Score - 4         Mobility Admission Current   Walk 10 Feet Assistance Needed: Physical assistance Total assistance  CARE Score - 1 Assistance Needed: Incidental touching    CARE Score - 4     Walk 50 Feet 2 Turns Assistance Needed: Physical assistance 25% or less  CARE Score - 88 Assistance Needed: Incidental touching, Adaptive equipment    CARE Score - 4   Walk 150 Feet Assistance Needed: Physical assistance 25% or less  CARE Score - 88  Assistance Needed: Incidental touching, Adaptive equipment    CARE Score - 4   Walk 10 Feet Uneven Assistance Needed: Physical assistance 25% or less  CARE Score - 88  Assistance Needed: Physical assistance    CARE Score - 3   1 Step (Curb) Assistance Needed: Incidental touching, Adaptive equipment    CARE Score - 88  Assistance Needed: Incidental touching, Adaptive equipment    CARE Score - 4   4 Steps Assistance Needed: Incidental touching, Adaptive equipment    CARE Score - 88  Assistance Needed: Incidental touching, Adaptive equipment    CARE Score - 4   12 Steps      CARE Score - 88       CARE Score - 88   Picking Up Object      CARE Score - 88       CARE Score - 88   Wheelchair/Scooter Use        Wheel 50 Feet 2 Turns Assistance Needed: Physical assistance Total assistance    CARE Score - Wheel 50 Feet with Two Turns: 1    Type of Wheelchair/Scooter: Manual Assistance Needed: Independent      CARE Score - Wheel 50 Feet with Two Turns: 6    Type of Wheelchair/Scooter: Manual   Wheel 150 Feet Assistance Needed: Physical assistance Total assistance    CARE Score - Wheel 150 Feet: 1    Type of Wheelchair/Scooter: Manual  Assistance Needed: Independent      CARE Score - Wheel 150 Feet: 6    Type of Wheelchair/Scooter: Manual

## 2022-12-03 NOTE — Unmapped (Addendum)
Alert and oriented, pain managed with dilaudid. Had loose bowel movement with some blood in it, the doctor was notified. All safety and skin care protocols maintained. Monitoring continues  Problem: Rehabilitation ([facility]) Plan of Care  Goal: Plan of Care Review  Outcome: Progressing  Goal: Patient-Specific Goal (Individualized)  Outcome: Progressing  Goal: Absence of New-Onset Illness or Injury  Outcome: Progressing  Intervention: Prevent Fall and Fall Injury  Recent Flowsheet Documentation  Taken 12/03/2022 0200 by Roderick Pee, RN  Safety Interventions: fall reduction program maintained  Taken 12/03/2022 0000 by Roderick Pee, RN  Safety Interventions: fall reduction program maintained  Taken 12/02/2022 2200 by Roderick Pee, RN  Safety Interventions: fall reduction program maintained  Taken 12/02/2022 1930 by Roderick Pee, RN  Safety Interventions: fall reduction program maintained  Intervention: Prevent Infection  Recent Flowsheet Documentation  Taken 12/02/2022 1930 by Roderick Pee, RN  Infection Prevention: hand hygiene promoted  Goal: Optimal Comfort and Wellbeing  Outcome: Progressing  Goal: Home and Community Transition Plan Established  Outcome: Progressing  Goal: Rounds/Family Conference  Outcome: Progressing     Problem: Self-Care Deficit  Goal: Improved Ability to Complete Activities of Daily Living  Outcome: Progressing     Problem: Skin Injury Risk Increased  Goal: Skin Health and Integrity  Outcome: Progressing  Intervention: Optimize Skin Protection  Recent Flowsheet Documentation  Taken 12/03/2022 0200 by Roderick Pee, RN  Pressure Reduction Devices: chair cushion utilized  Taken 12/03/2022 0000 by Roderick Pee, RN  Pressure Reduction Devices: chair cushion utilized  Taken 12/02/2022 2200 by Roderick Pee, RN  Pressure Reduction Devices: chair cushion utilized  Taken 12/02/2022 1930 by Roderick Pee, RN  Pressure Reduction Techniques: frequent weight shift encouraged  Pressure Reduction Devices: chair cushion utilized     Problem: Pain Acute  Goal: Optimal Pain Control and Function  Outcome: Progressing     Problem: Fall Injury Risk  Goal: Absence of Fall and Fall-Related Injury  Outcome: Progressing  Intervention: Promote Scientist, clinical (histocompatibility and immunogenetics) Documentation  Taken 12/03/2022 0200 by Roderick Pee, RN  Safety Interventions: fall reduction program maintained  Taken 12/03/2022 0000 by Roderick Pee, RN  Safety Interventions: fall reduction program maintained  Taken 12/02/2022 2200 by Roderick Pee, RN  Safety Interventions: fall reduction program maintained  Taken 12/02/2022 1930 by Roderick Pee, RN  Safety Interventions: fall reduction program maintained

## 2022-12-03 NOTE — Unmapped (Signed)
Pt has remained free from falls/injury/infection. Pt communicates needs appropriately.  Continent of bowel and bladder.    Intervention: Prevent Fall and Fall Injury  Recent Flowsheet Documentation  Taken 12/03/2022 1200 by Colbert Coyer, RN  Safety Interventions: fall reduction program maintained  Taken 12/03/2022 1000 by Colbert Coyer, RN  Safety Interventions: fall reduction program maintained  Taken 12/03/2022 0800 by Colbert Coyer, RN  Safety Interventions: fall reduction program maintained  Intervention: Prevent Skin Injury  Recent Flowsheet Documentation  Taken 12/03/2022 1200 by Colbert Coyer, RN  Skin Protection: incontinence pads utilized  Taken 12/03/2022 1000 by Colbert Coyer, RN  Skin Protection: incontinence pads utilized  Taken 12/03/2022 0800 by Colbert Coyer, RN  Skin Protection: incontinence pads utilized  Intervention: Prevent Infection  Recent Flowsheet Documentation  Taken 12/03/2022 1200 by Colbert Coyer, RN  Infection Prevention:   rest/sleep promoted   hand hygiene promoted   equipment surfaces disinfected  Taken 12/03/2022 1000 by Colbert Coyer, RN  Infection Prevention:   rest/sleep promoted   hand hygiene promoted   equipment surfaces disinfected  Taken 12/03/2022 0800 by Colbert Coyer, RN  Infection Prevention:   rest/sleep promoted   hand hygiene promoted   equipment surfaces disinfected

## 2022-12-04 LAB — CBC
HEMATOCRIT: 31.3 % — ABNORMAL LOW (ref 34.0–44.0)
HEMOGLOBIN: 10.5 g/dL — ABNORMAL LOW (ref 11.3–14.9)
MEAN CORPUSCULAR HEMOGLOBIN CONC: 33.6 g/dL (ref 32.0–36.0)
MEAN CORPUSCULAR HEMOGLOBIN: 27.8 pg (ref 25.9–32.4)
MEAN CORPUSCULAR VOLUME: 82.8 fL (ref 77.6–95.7)
MEAN PLATELET VOLUME: 10 fL (ref 6.8–10.7)
PLATELET COUNT: 244 10*9/L (ref 150–450)
RED BLOOD CELL COUNT: 3.78 10*12/L — ABNORMAL LOW (ref 3.95–5.13)
RED CELL DISTRIBUTION WIDTH: 15.8 % — ABNORMAL HIGH (ref 12.2–15.2)
WBC ADJUSTED: 8.2 10*9/L (ref 3.6–11.2)

## 2022-12-04 LAB — IRON PANEL
IRON SATURATION (CALC): 22 %
IRON: 42 ug/dL — ABNORMAL LOW
TOTAL IRON BINDING CAPACITY (CALC): 187.7 ug/dL — ABNORMAL LOW (ref 250.0–425.0)
TRANSFERRIN: 149 mg/dL — ABNORMAL LOW

## 2022-12-04 LAB — COMPREHENSIVE METABOLIC PANEL
ALBUMIN: 3.2 g/dL — ABNORMAL LOW (ref 3.4–5.0)
ALKALINE PHOSPHATASE: 72 U/L (ref 46–116)
ALT (SGPT): <7 U/L — ABNORMAL LOW (ref 10–49)
ANION GAP: 6 mmol/L (ref 5–14)
AST (SGOT): 14 U/L (ref <=34)
BILIRUBIN TOTAL: 0.2 mg/dL — ABNORMAL LOW (ref 0.3–1.2)
BLOOD UREA NITROGEN: 8 mg/dL — ABNORMAL LOW (ref 9–23)
BUN / CREAT RATIO: 20
CALCIUM: 9 mg/dL (ref 8.7–10.4)
CHLORIDE: 110 mmol/L — ABNORMAL HIGH (ref 98–107)
CO2: 26.8 mmol/L (ref 20.0–31.0)
CREATININE: 0.4 mg/dL — ABNORMAL LOW
EGFR CKD-EPI (2021) FEMALE: >90 mL/min/{1.73_m2} (ref >=60)
GLUCOSE RANDOM: 114 mg/dL (ref 70–179)
POTASSIUM: 3.9 mmol/L (ref 3.4–4.8)
PROTEIN TOTAL: 6.1 g/dL (ref 5.7–8.2)
SODIUM: 143 mmol/L (ref 135–145)

## 2022-12-04 LAB — FERRITIN: FERRITIN: 37.6 ng/mL

## 2022-12-04 LAB — MAGNESIUM: MAGNESIUM: 2.3 mg/dL (ref 1.6–2.6)

## 2022-12-04 LAB — PHOSPHORUS: PHOSPHORUS: 3.8 mg/dL (ref 2.4–5.1)

## 2022-12-04 MED ADMIN — HYDROmorphone (PF) (DILAUDID) injection 0.5 mg: .5 mg | INTRAVENOUS | @ 11:00:00 | Stop: 2022-12-07

## 2022-12-04 MED ADMIN — diazePAM (VALIUM) tablet 2.5 mg: 2.5 mg | ORAL | @ 21:00:00

## 2022-12-04 MED ADMIN — diazePAM (VALIUM) tablet 2.5 mg: 2.5 mg | ORAL | @ 14:00:00

## 2022-12-04 MED ADMIN — diazePAM (VALIUM) tablet 5 mg: 5 mg | ORAL | @ 01:00:00

## 2022-12-04 MED ADMIN — buprenorphine-naloxone (SUBOXONE) 2-0.5 mg SL film 1 mg of buprenorphine: .5 | SUBLINGUAL | @ 21:00:00

## 2022-12-04 MED ADMIN — HYDROmorphone (PF) (DILAUDID) injection 0.5 mg: .5 mg | INTRAVENOUS | Stop: 2022-12-07

## 2022-12-04 MED ADMIN — carboxymethylcellulose sodium (REFRESH CELLUVISC) 1 % ophthalmic gel 1 drop: 1 [drp] | OPHTHALMIC | @ 22:00:00

## 2022-12-04 MED ADMIN — oxyCODONE (ROXICODONE) immediate release tablet 15 mg: 15 mg | ORAL | @ 22:00:00 | Stop: 2022-12-10

## 2022-12-04 MED ADMIN — promethazine (PHENERGAN) 6.5 mg in sodium chloride (NS) 0.9 % 50 mL IVPB: 6.5 mg | INTRAVENOUS

## 2022-12-04 MED ADMIN — buprenorphine-naloxone (SUBOXONE) 2-0.5 mg SL film 1 mg of buprenorphine: .5 | SUBLINGUAL | @ 14:00:00

## 2022-12-04 MED ADMIN — sodium chloride (NS) 0.9 % infusion: 10 mL/h | INTRAVENOUS | @ 23:00:00

## 2022-12-04 MED ADMIN — buprenorphine-naloxone (SUBOXONE) 2-0.5 mg SL film 1 mg of buprenorphine: .5 | SUBLINGUAL | @ 01:00:00

## 2022-12-04 MED ADMIN — HYDROmorphone (PF) (DILAUDID) injection 0.5 mg: .5 mg | INTRAVENOUS | @ 17:00:00 | Stop: 2022-12-07

## 2022-12-04 MED ADMIN — nirogacestat (OGSIVEO) tablet 150 mg: 150 mg | ORAL | @ 11:00:00 | Stop: 2022-12-26

## 2022-12-04 MED ADMIN — famotidine (PEPCID) tablet 20 mg: 20 mg | ORAL | @ 01:00:00

## 2022-12-04 MED ADMIN — naloxegol (MOVANTIK) 12.5 mg tablet 12.5 mg: 12.5 mg | ORAL | @ 14:00:00

## 2022-12-04 MED ADMIN — oxyCODONE (ROXICODONE) immediate release tablet 15 mg: 15 mg | ORAL | Stop: 2022-12-04

## 2022-12-04 MED ADMIN — nirogacestat (OGSIVEO) tablet 150 mg: 150 mg | ORAL | @ 22:00:00 | Stop: 2022-12-26

## 2022-12-04 MED ADMIN — ammonium lactate (LAC-HYDRIN) 12 % lotion 1 Application: 1 | TOPICAL | @ 02:00:00

## 2022-12-04 MED ADMIN — ondansetron (ZOFRAN) injection 4 mg: 4 mg | INTRAVENOUS

## 2022-12-04 MED ADMIN — ondansetron (ZOFRAN) injection 4 mg: 4 mg | INTRAVENOUS | @ 11:00:00

## 2022-12-04 MED ADMIN — promethazine (PHENERGAN) 6.5 mg in sodium chloride (NS) 0.9 % 50 mL IVPB: 6.5 mg | INTRAVENOUS | @ 17:00:00

## 2022-12-04 MED ADMIN — oxyCODONE (ROXICODONE) immediate release tablet 15 mg: 15 mg | ORAL | @ 15:00:00 | Stop: 2022-12-04

## 2022-12-04 MED ADMIN — pantoprazole (Protonix) EC tablet 40 mg: 40 mg | ORAL | @ 01:00:00

## 2022-12-04 NOTE — Unmapped (Addendum)
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 Admitted to [facility] on 09/25/2022 for ileus, inability to tolerate PO now on supplemental tube feed, and diffuse pain due to muscle spasms of uncertain etiology.      Rehab Impairment Group Code [facility]): (Debility) 16 Debility (Non-Cardiac/Non-Pulmonary)  Etiology: Diffuse pain due to muscle spasms of uncertain etiology and cutaneous desmoid tumors    PLAN:     This patient is admitted to the Physical Medicine and Rehabilitation - Inpatient - C service from 8am-5pm on weekdays for questions regarding this patient. After hours, weekends, and holidays please contact the 1st Call resident pager     REHAB:   - PT and OT to maximize functional status with mobility and ADLs as well as prevention of joint contracture.   - Neuropsych for higher level cognitive evaluation and coping.  - RT for community re-integration, education, and leisure support services.  - Nutrition consult for diet information/teaching.   - To be discussed in weekly Interdisciplinary Team Conference.     Increased Tonicity  Dystonia   Started post-procedurally per patient ~09/25/22. Occurs intermittently throughout the day with a heightened occurrence during mobilization. At rest patient bilateral ankles in plantarflexion with worsening heel cord tightening. Has tonicity most prominent to HF and KE that is not velocity dependent. Has not noted significant improvement with increase of baclofen to 15 mg TID in setting of infection as below. Neurology previously evaluated patient seemingly ruling out an acute dystonic reaction, cord compression, central process, nutritional deficiency, stiff person syndrome and [name] after normal EMG on 8/22. Continues to be well controlled with valium, small improvement with baclofen PRN.  - Continue Baclofen 10 mg TID prn  - Continued Valium 2.5/2.5/5 mg TID and 2.5 mg nightly PRN  - PRAFO boots in bed     Headache  Currently managed with tylenol. She does have some posterior neck muscle tightness. Notes ketamine is helpful for headache pains. Describes photophobia and phonophobia with events. She does have loss of cervical lordosis on most recent MRI.   - Consider starting propranolol 5 mg BID for headache prophylaxis.   - PRN tylenol +/- prn fiorcet   - Lidocaine cream PRN to area of pain for cervicogenic headaches     Pain  Pain management consulted with recommendations as below. Patient will need to follow up with outpatient chronic pain provider ([name]). Gabapentin discontinued 9/17 and will not be continued upon discharge. Removed central line 9/23 and placed PIV. Discussed replacing peripheral line if needed vs transitioning to all oral medications. Will continue PIV for now.   - Tylenol 650 mg q6h PRN  - IV Dilaudid 0.5 mg q6h PRN; will wean as tolerated  - Oxyocodone 10-15 mg q4h PRN  - Suboxone 2-0.5 mg TID  - Voltaren gel QID, lidocaine patches  - Continue patellar taping with therapy or obtain patellar brace to help with pain with ambulation  - Continue Movantik 12.5mg  daily; discontinue upon discharge from AIR    Anasarca  Patient noted increased lower extremity, facial, and abdominal swelling prior to admission to AIR. Reported that pitting edema was present when taking Lyrica. Gabapentin discontinued 9/17. No current medications with significant side effects of swelling. Requested IV Lasix as this was helpful in  the past. Noted that some edema could be related to gabapentin, will plan to monitor and discontinue as indicated. On exam, patient with mild edema in bilateral feet, not appreciated elsewhere on exam. Noted improvement in edema with IV Lasix. Will continue to monitor daily and dose as needed. Oral Bumex trial 9/18 modestly helpful.    - Bumex 0.5mg  x1 9/18. Lasix IV 10mg  x1 9/19. Lasix IV 10mg  x1 9/20  - Daily standing weight  - Increase protein intake per dietician     Multifactorial Ileus   Nausea (chronic)  [place] Emesis 2/2 NGT placement (resolved)  Contributors with history of narcotics, poor PO intake. Concern for malnutrition. Improvements to abdominal pain since weaning opioids. TPN has been discontinued with patient now tolerating tube feeds. Patient with chronic nausea that was previously managed with Zofran, though transitioned to phenergan due to prolonged Qtc. Upon admission to AIR, continued nightly tube feeds with Corpak NG tube in place, and PO intake as tolerated during the day. Will continue to decrease tube feeds with goal of discharge from AIR on full regular diet. If unable to tolerate full PO diet, will consider discharge home with corpak. Patient noted weight gain upon arrival to AIR (felt likely fluid retention) as weight was increased in a short period of time.   - Dietician following; appreciate care and recommendations  - Continue nightly tube feeds  - Continue PO regular diet + protein supplementation; anticipate NG tube removal prior to discharge   - Calorie count  - Daily standing weights   - Continue Protonix 40mg  BID  - Continue IV phenergan 6.5mg  Q6 PRN; Scopolamine patch ordered 9/19 in hopes of transitioning off IV  [ ]  restart home cefdinir and doxycycline upon discharge from AIR for SIBP     Syncope  Multifactorial. Primary team ddx includes: vasovagal vs adverse effect of medications vs deconditioning vs iron deficiency. Last echo 04/2022. Stable upon admission to AIR.   - CTM     IDA  Baseline hemoglobin ~9-10. Stable upon admission to AIR at 9.8. Will continue to monitor while admitted.   - M/Th CBC  - Has allergy to formula iron      [name] - Desmoid Tumors  Follows [facility] (Dr. [name]) is patient's outpatient oncologist. Per oncology, most common side effects of the chemotherapy are diarrhea, nausea, headache, and rash. Patient has not had increase in baseline nausea while taking the medication and increased dose as planned on 9/17. Will continue to monitor and discuss with outpatient provider as indicated. Noted some dry mouth and skin sensitivity on 9/24, patient reported similar symptoms with chemotherapy in the past. Started PRN ammonium lactate lotion, biotene spray, and mucositis mixture as needed.   - Nirogacestat 150mg  BID; increased 9/17  - M/Th CMP; monitor LFT  - Ammonium lactate lotion PRN 9/24  - Biotene spray PRN 9/24  - mucositis swish/spit PRN     Enterobacteriaceae group bacteremia (Resolved) - C albicans fungemia (Resolved)  Likely due to RIJ tunneled line placed 01/2022 now removed on 8/14.  8/13, 8/17 repeat blood cultures NGTD.  No evidence of vitreitis, retinitis 8/15 per Ophtho.  TTE 8/15 w possible vegetations on MV and TV or indwelling catheter; however, TEE 8/20 w/o vegetations. ID consulted during acute admission and now s/p treatment with cephalosporin and fluconazole on 8/31. Patient noted to have difficult IV access and kept R IJ access upon admission to AIR. Placed peripheral IV on 9/23 and removed central line.  Generalized anxiety disorder, depression, h/o panic attacks  Psychiatry consulted during acute hospitalization with recommendations as below.   - Consider decreasing mirtazapine 15 mg nightly to 7.5 mg nightly due to somnolence  - Valium as above, if needed could give 2.5mg  PRN for acute anxiety  - Neuropsychology consulted; appreciate care    DME  - Bedside Commode Necessity: Patient requires a bedside commode as they are confined to a single room and/or level of the home w/ no access to a bathroom on that level.   Dan Humphreys Necessity: Patient requires a rolling walker due to mobility limitation that significantly impairs their ability to participate in one or more mobility related activities of daily living in the home. A mobility limitation provides the beneficiary from accomplishing MRADL entirely. The beneficiary is able to safely use the walker.     Daily Checklist  - Diet: Regular diet + nighttime tube feeds  - DVT PPX: SQ enoxaparin; patient refused   - GI PPX: Protonix  - Access: PIV. S/p R IJ non tunneled CVC.     DISPO: Patient to be discussed at weekly interdisciplinary team conference.   - EDD: 12/10/2022  - Follow-up: PCP, Chronic Pain, Oncology (Dr. [name]), Psych    SUBJECTIVE and PROGRESS WITH FUNCTIONAL ACTIVITIES:     Interval events:  Red stool noted yesterday and a small amount this morning. No change in fatigue or other symptoms to suggest anema. Labs reviewed and stable. Agree to monitor. Patient concerned with higher weight today. Will wait for another weight to reconsider IV lasix.    OBJECTIVE:     Vital signs (last 24 hours):  Temp:  [36.6 ??C (97.9 ??F)-36.8 ??C (98.2 ??F)] 36.6 ??C (97.9 ??F)  Heart Rate:  [93-100] 93  Resp:  [17-18] 18  BP: (108-141)/(56-88) 108/56  MAP (mmHg):  [70-103] 70  SpO2:  [97 %-99 %] 99 %    Intake/Output (last 3 shifts):  I/O last 3 completed shifts:  In: 90 [P.O.:90]  Out: 1050 [Urine:1050]    Physical Exam:   GEN: Sitting comfortably in bed in no acute distress  EYES: sclera anicteric, conjunctiva clear   HEENT: Corpak in place L nare   CV: Warm and well perfused, trace edema in bilateral feet is stabale  RESP:  NWB on RA with [place] in place  ABD: Not distended  SKIN: Central line site without erythema, bandage in place clean/dry/intact  MSK: Not visualized today, previously sacrum with some soft tissue swelling near lumbosacral junction   NEURO: Alert, speech fluent  PSYCH: Mood euthymic, affect appropriate    Medications:  Scheduled    ammonium lactate (LAC-HYDRIN) 12 % lotion 1 Application BID    buprenorphine-naloxone (SUBOXONE) 2-0.5 mg SL film 1 mg of buprenorphine TID    carboxymethylcellulose sodium (REFRESH CELLUVISC) 1 % ophthalmic gel 1 drop QID diazePAM (VALIUM) tablet 2.5 mg Daily    And    diazePAM (VALIUM) tablet 2.5 mg Daily    diazePAM (VALIUM) tablet 5 mg Nightly    diclofenac sodium (VOLTAREN) 1 % gel 2 g QID    [Provider Hold] enoxaparin (LOVENOX) syringe 40 mg Q24H SCH    famotidine (PEPCID) tablet 20 mg Nightly    flu vaccine TS 2024-25(67mos up)(PF)(FLULAVAL, FLUARIX, FLUZONE) During hospitalization    lidocaine (ASPERCREME) 4 % 2 patch Daily    mirtazapine (REMERON) tablet 15 mg Nightly    multivitamin with folic acid 400 mcg tablet 1 tablet Daily    naloxegol (MOVANTIK) 12.5 mg tablet  12.5 mg Daily    nirogacestat (OGSIVEO) tablet 150 mg BID    pantoprazole (Protonix) EC tablet 40 mg BID    scopolamine (TRANSDERM-SCOP) 1 mg over 3 days topical patch 1 mg Q72H     PRN acetaminophen, 650 mg, Q6H PRN  alum-mag-simeth, 60 mL, Q6H PRN  baclofen, 10 mg, TID PRN  bisacodyl, 10 mg, BID PRN  butalbital-acetaminophen-caffeine, 1 tablet, BID PRN  carboxymethylcellulose sodium, 2 drop, QID PRN  Chemo Clarification Order, , Continuous PRN  Chemo Clarification Order, , Continuous PRN  diazePAM, 2.5 mg, Nightly PRN  diphenhydrAMINE, 25 mg, Q6H PRN  guaiFENesin, 200 mg, Q4H PRN  hydrocortisone, , BID PRN  HYDROmorphone, 0.5 mg, Q6H PRN  IP okay to treat, , Continuous PRN  lidocaine-diphenhydrAMINE-aluminum-magnesium, 10 mL, QID PRN  ondansetron, 4 mg, Q12H PRN  oxyCODONE, 10 mg, Q4H PRN   Or  oxyCODONE, 15 mg, Q4H PRN  phenol, 2 spray, Q2H PRN  polyethylene glycol, 17 g, BID PRN  promethazine, 6.5 mg, Q6H PRN  pseudoephedrine, 30 mg, Daily PRN  saliva stimulant comb. no.3, 1 spray, Q2H PRN  senna, 1 tablet, BID PRN  zinc oxide-cod liver oil, , Daily PRN        Labs/Studies: Reviewed.    Radiology Results: Reviewed    Quality Indicators      Hearing, Speech, and Vision  Ability to Hear: Adequate  Ability to See in Adequate Light: Adequate  Expression of Ideas and Wants: Without difficulty  Understanding Verbal and Non-Verbal Content: Understands    Cognitive Pattern Assessment  Cognitive Pattern Assessment Used: BIMS  Brief Interview for Mental Status (BIMS)  Repetition of Three Words (First Attempt): 3  Temporal Orientation: Year: Correct  Temporal Orientation: Month: Accurate within 5 days  Temporal Orientation: Day: Correct  Recall: Sock: Yes, no cue required  Recall: Blue: Yes, no cue required  Recall: Bed: Yes, no cue required  BIMS Summary Score: 15       Nutritional Approaches  Nutritional Approach: Feeding tube    ADLs  Admission Current   Eating Assistance Needed: Set-up / clean-up    CARE Score - 5 Assistance Needed: Independent    CARE Score - 6   Oral Hygiene  Assistance Needed: Set-up / clean-up    CARE Score - 5  Assistance Needed: Independent    CARE Score - 6   Bladder Continence       Bowel Continence       Toileting Hygiene Assistance Needed: Physical assistance Total assistance  CARE Score - 1 Assistance Needed: Incidental touching    CARE Score - 4    Toilet Transfer Assistance Needed: Physical assistance Total assistance  CARE Score - 88 Assistance Needed: Incidental touching, Supervision    CARE Score - 4   Shower/Bathe Self Assistance Needed: Physical assistance 26%-50%  CARE Score - 88  Assistance Needed: Physical assistance    CARE Score - 3   Upper Body Dressing Assistance Needed: Physical assistance 26%-50%  CARE Score - 3  Assistance Needed: Independent    CARE Score - 6   Lower Body Dressing Assistance Needed: Physical assistance Total assistance  CARE Score - 1  Assistance Needed: Incidental touching, Supervision, Adaptive equipment    CARE Score - 4   On/Off Footwear Assistance Needed: Physical assistance Total assistance  CARE Score - 1  Assistance Needed: Physical assistance, Adaptive equipment 26%-50%  CARE Score - 3         Transfers Admission Current   Bed  to Chair Assistance Needed: Physical assistance Total assistance  CARE Score - 1  Assistance Needed: Supervision    CARE Score - 4     Lying to Sitting Assistance Needed: Physical assistance Total assistance  CARE Score - 1  Assistance Needed: Physical assistance 25% or less  CARE Score - 3   Roll Left/Right Assistance Needed: Physical assistance 76% or more  CARE Score - 2  Assistance Needed: Adaptive equipment, Incidental touching    CARE Score - 4   Sit to Lying Assistance Needed: Physical assistance Total assistance  CARE Score - 1  Assistance Needed: Physical assistance 25% or less  CARE Score - 3   Sit to Stand Assistance Needed: Physical assistance Total assistance  CARE Score - 1  Assistance Needed: Incidental touching, Adaptive equipment    CARE Score - 4         Mobility Admission Current   Walk 10 Feet Assistance Needed: Physical assistance Total assistance  CARE Score - 1 Assistance Needed: Incidental touching, Adaptive equipment    CARE Score - 4     Walk 50 Feet 2 Turns Assistance Needed: Physical assistance 25% or less  CARE Score - 88 Assistance Needed: Incidental touching, Adaptive equipment    CARE Score - 4   Walk 150 Feet Assistance Needed: Physical assistance 25% or less  CARE Score - 88  Assistance Needed: Incidental touching, Adaptive equipment    CARE Score - 4   Walk 10 Feet Uneven Assistance Needed: Physical assistance 25% or less  CARE Score - 88  Assistance Needed: Incidental touching, Adaptive equipment    CARE Score - 4   1 Step (Curb) Assistance Needed: Incidental touching, Adaptive equipment    CARE Score - 88  Assistance Needed: Incidental touching, Adaptive equipment    CARE Score - 4   4 Steps Assistance Needed: Incidental touching, Adaptive equipment    CARE Score - 88  Assistance Needed: Incidental touching, Adaptive equipment    CARE Score - 4   12 Steps      CARE Score - 88       CARE Score - 88   Picking Up Object      CARE Score - 88       CARE Score - 88   Wheelchair/Scooter Use        Wheel 50 Feet 2 Turns Assistance Needed: Physical assistance Total assistance    CARE Score - Wheel 50 Feet with Two Turns: 1    Type of Wheelchair/Scooter: Manual Assistance Needed: Independent      CARE Score - Wheel 50 Feet with Two Turns: 6    Type of Wheelchair/Scooter: Manual   Wheel 150 Feet Assistance Needed: Physical assistance Total assistance    CARE Score - Wheel 150 Feet: 1    Type of Wheelchair/Scooter: Manual  Assistance Needed: Independent      CARE Score - Wheel 150 Feet: 6    Type of Wheelchair/Scooter: Manual        ATTENDING PHYSICIAN ATTESTATION:     I saw and evaluated the patient, participating in the key portions of the service.  I reviewed the resident's note.  I agree with the resident's findings and plan.     Zenovia Jarred III, MD  Associate Professor  Department of Physical Medicine & Rehabilitation

## 2022-12-04 NOTE — Unmapped (Addendum)
Pt is AOX4 Pain managed with scheduled and PRN medication. IV NS infusing and feed running. Plan of care maintained  Problem: Rehabilitation ([facility]) Plan of Care  Goal: Plan of Care Review  Outcome: Progressing  Goal: Patient-Specific Goal (Individualized)  Outcome: Progressing  Goal: Absence of New-Onset Illness or Injury  Outcome: Progressing  Intervention: Prevent Fall and Fall Injury  Recent Flowsheet Documentation  Taken 12/03/2022 2200 by Roderick Pee, RN  Safety Interventions: fall reduction program maintained  Taken 12/03/2022 1930 by Roderick Pee, RN  Safety Interventions: fall reduction program maintained  Intervention: Prevent Skin Injury  Recent Flowsheet Documentation  Taken 12/03/2022 1930 by Roderick Pee, RN  Skin Protection: incontinence pads utilized  Intervention: Prevent Infection  Recent Flowsheet Documentation  Taken 12/03/2022 1930 by Roderick Pee, RN  Infection Prevention: hand hygiene promoted  Goal: Optimal Comfort and Wellbeing  Outcome: Progressing  Goal: Home and Community Transition Plan Established  Outcome: Progressing  Goal: Rounds/Family Conference  Outcome: Progressing     Problem: Self-Care Deficit  Goal: Improved Ability to Complete Activities of Daily Living  Outcome: Progressing     Problem: Skin Injury Risk Increased  Goal: Skin Health and Integrity  Outcome: Progressing  Intervention: Optimize Skin Protection  Recent Flowsheet Documentation  Taken 12/04/2022 0000 by Roderick Pee, RN  Pressure Reduction Devices: chair cushion utilized  Taken 12/03/2022 2200 by Roderick Pee, RN  Pressure Reduction Devices: chair cushion utilized  Taken 12/03/2022 1930 by Roderick Pee, RN  Pressure Reduction Techniques: frequent weight shift encouraged  Pressure Reduction Devices: chair cushion utilized  Skin Protection: incontinence pads utilized     Problem: Pain Acute  Goal: Optimal Pain Control and Function  Outcome: Progressing     Problem: Fall Injury Risk  Goal: Absence of Fall and Fall-Related Injury  Outcome: Progressing  Intervention: Promote Scientist, clinical (histocompatibility and immunogenetics) Documentation  Taken 12/03/2022 2200 by Roderick Pee, RN  Safety Interventions: fall reduction program maintained  Taken 12/03/2022 1930 by Roderick Pee, RN  Safety Interventions: fall reduction program maintained

## 2022-12-04 NOTE — Unmapped (Signed)
Pt weight obtained. Scale read two lbs more than yesterday. Pt very emotional and tearful over the weight gain. Emotional support provided.

## 2022-12-05 MED ADMIN — furosemide (LASIX) injection 10 mg: 10 mg | INTRAVENOUS | @ 19:00:00 | Stop: 2022-12-05

## 2022-12-05 MED ADMIN — HYDROmorphone (PF) (DILAUDID) injection 0.5 mg: .5 mg | INTRAVENOUS | @ 19:00:00 | Stop: 2022-12-06

## 2022-12-05 MED ADMIN — carboxymethylcellulose sodium (REFRESH CELLUVISC) 1 % ophthalmic gel 1 drop: 1 [drp] | OPHTHALMIC | @ 21:00:00

## 2022-12-05 MED ADMIN — saliva stimulant mucosal spray (BIOTENE): 1 | ORAL | @ 16:00:00

## 2022-12-05 MED ADMIN — nirogacestat (OGSIVEO) tablet 150 mg: 150 mg | ORAL | @ 10:00:00 | Stop: 2022-12-26

## 2022-12-05 MED ADMIN — famotidine (PEPCID) tablet 20 mg: 20 mg | ORAL | @ 02:00:00

## 2022-12-05 MED ADMIN — pantoprazole (Protonix) EC tablet 40 mg: 40 mg | ORAL | @ 02:00:00

## 2022-12-05 MED ADMIN — buprenorphine-naloxone (SUBOXONE) 2-0.5 mg SL film 1 mg of buprenorphine: .5 | SUBLINGUAL | @ 02:00:00

## 2022-12-05 MED ADMIN — naloxegol (MOVANTIK) 12.5 mg tablet 12.5 mg: 12.5 mg | ORAL | @ 13:00:00

## 2022-12-05 MED ADMIN — lidocaine (ASPERCREME) 4 % 2 patch: 2 | TRANSDERMAL | @ 13:00:00

## 2022-12-05 MED ADMIN — multivitamin with folic acid 400 mcg tablet 1 tablet: 1 | ORAL | @ 13:00:00

## 2022-12-05 MED ADMIN — ammonium lactate (LAC-HYDRIN) 12 % lotion 1 Application: 1 | TOPICAL | @ 02:00:00

## 2022-12-05 MED ADMIN — oxyCODONE (ROXICODONE) immediate release tablet 15 mg: 15 mg | ORAL | @ 16:00:00 | Stop: 2022-12-10

## 2022-12-05 MED ADMIN — saliva stimulant mucosal spray (BIOTENE): 1 | ORAL | @ 03:00:00

## 2022-12-05 MED ADMIN — nirogacestat (OGSIVEO) tablet 150 mg: 150 mg | ORAL | @ 22:00:00 | Stop: 2022-12-26

## 2022-12-05 MED ADMIN — diazePAM (VALIUM) tablet 5 mg: 5 mg | ORAL | @ 02:00:00

## 2022-12-05 MED ADMIN — promethazine (PHENERGAN) 6.5 mg in sodium chloride (NS) 0.9 % 50 mL IVPB: 6.5 mg | INTRAVENOUS | @ 17:00:00

## 2022-12-05 MED ADMIN — oxyCODONE (ROXICODONE) immediate release tablet 15 mg: 15 mg | ORAL | @ 02:00:00 | Stop: 2022-12-10

## 2022-12-05 MED ADMIN — pantoprazole (Protonix) EC tablet 40 mg: 40 mg | ORAL | @ 13:00:00

## 2022-12-05 MED ADMIN — buprenorphine-naloxone (SUBOXONE) 2-0.5 mg SL film 1 mg of buprenorphine: .5 | SUBLINGUAL | @ 13:00:00

## 2022-12-05 MED ADMIN — diazePAM (VALIUM) tablet 2.5 mg: 2.5 mg | ORAL | @ 18:00:00

## 2022-12-05 MED ADMIN — HYDROmorphone (PF) (DILAUDID) injection 0.5 mg: .5 mg | INTRAVENOUS | @ 12:00:00 | Stop: 2022-12-05

## 2022-12-05 MED ADMIN — promethazine (PHENERGAN) 6.5 mg in sodium chloride (NS) 0.9 % 50 mL IVPB: 6.5 mg | INTRAVENOUS | @ 02:00:00

## 2022-12-05 MED ADMIN — mucositis mixture (with lidocaine): 10 mL | OROMUCOSAL | @ 04:00:00

## 2022-12-05 MED ADMIN — buprenorphine-naloxone (SUBOXONE) 2-0.5 mg SL film 1 mg of buprenorphine: .5 | SUBLINGUAL | @ 18:00:00

## 2022-12-05 MED ADMIN — carboxymethylcellulose sodium (REFRESH CELLUVISC) 1 % ophthalmic gel 1 drop: 1 [drp] | OPHTHALMIC | @ 16:00:00

## 2022-12-05 MED ADMIN — diazePAM (VALIUM) tablet 2.5 mg: 2.5 mg | ORAL | @ 13:00:00

## 2022-12-05 MED ADMIN — carboxymethylcellulose sodium (REFRESH CELLUVISC) 1 % ophthalmic gel 1 drop: 1 [drp] | OPHTHALMIC | @ 13:00:00

## 2022-12-05 MED ADMIN — ondansetron (ZOFRAN) injection 4 mg: 4 mg | INTRAVENOUS | @ 12:00:00

## 2022-12-05 NOTE — Unmapped (Signed)
Dr.Brown asked that we schedule this patient for a follow up after their expected discharge on 10/2. Okay to overbook his schedule.

## 2022-12-05 NOTE — Unmapped (Signed)
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 Admitted to [facility] on 09/25/2022 for ileus, inability to tolerate PO now on supplemental tube feed, and diffuse pain due to muscle spasms of uncertain etiology.      Rehab Impairment Group Code [facility]): (Debility) 16 Debility (Non-Cardiac/Non-Pulmonary)  Etiology: Diffuse pain due to muscle spasms of uncertain etiology and cutaneous desmoid tumors    PLAN:     This patient is admitted to the Physical Medicine and Rehabilitation - Inpatient - C service from 8am-5pm on weekdays for questions regarding this patient. After hours, weekends, and holidays please contact the 1st Call resident pager     REHAB:   - PT and OT to maximize functional status with mobility and ADLs as well as prevention of joint contracture.   - Neuropsych for higher level cognitive evaluation and coping.  - RT for community re-integration, education, and leisure support services.  - Nutrition consult for diet information/teaching.   - To be discussed in weekly Interdisciplinary Team Conference.     Increased Tonicity  Dystonia   Started post-procedurally per patient ~09/25/22. Occurs intermittently throughout the day with a heightened occurrence during mobilization. At rest patient bilateral ankles in plantarflexion with worsening heel cord tightening. Has tonicity most prominent to HF and KE that is not velocity dependent. Has not noted significant improvement with increase of baclofen to 15 mg TID in setting of infection as below. Neurology previously evaluated patient seemingly ruling out an acute dystonic reaction, cord compression, central process, nutritional deficiency, stiff person syndrome and [name] after normal EMG on 8/22. Continues to be well controlled with valium, small improvement with baclofen PRN.  - Continue Baclofen 10 mg TID prn  - Continued Valium 2.5/2.5/5 mg TID and 2.5 mg nightly PRN  - PRAFO boots in bed     Headache  Currently managed with tylenol. She does have some posterior neck muscle tightness. Notes ketamine is helpful for headache pains. Describes photophobia and phonophobia with events. She does have loss of cervical lordosis on most recent MRI.   - Consider starting propranolol 5 mg BID for headache prophylaxis.   - PRN tylenol +/- prn fiorcet   - Lidocaine cream PRN to area of pain for cervicogenic headaches     Pain  Pain management consulted with recommendations as below. Patient will need to follow up with outpatient chronic pain provider ([name]). Gabapentin discontinued 9/17 and will not be continued upon discharge. Removed central line 9/23 and placed PIV. Continued on IV pain medication through PIV. Discussed discharge plan with outpatient pain provider and will start IV pain medication wean on 9/28.   - Tylenol 650 mg q6h PRN  - Dilaudid 0.5mg  IV Q6 PRN; decrease to BID PRN on 9/28 and again to daily PRN on 9/30  - Oxyocodone 10-15 mg q4h PRN  - Suboxone 2-0.5 mg TID  - Voltaren gel QID, lidocaine patches  - Continue patellar taping with therapy or obtain patellar brace to help with pain with ambulation  - Continue Movantik 12.5mg  daily; discontinue upon discharge from [facility]    Anasarca  Patient noted increased lower extremity, facial, and abdominal swelling prior to admission to [facility]. Reported that pitting edema was present when taking Lyrica. Gabapentin discontinued 9/17. No current medications  with significant side effects of swelling. Requested IV Lasix as this was helpful in the past. Noted that some edema could be related to gabapentin, will plan to monitor and discontinue as indicated. On exam, patient with mild edema in bilateral feet, not appreciated elsewhere on exam. Noted improvement in edema with IV Lasix. Will continue to monitor daily and dose as needed. Oral Bumex trial 9/18 modestly helpful.    - Bumex 0.5mg  x1 9/18. Lasix IV 10mg  x1 9/19. Lasix IV 10mg  x1 9/20. Lasix IV 10mg  x1 9/27  - Daily standing afternoon weight  - Increase protein intake per dietician     Multifactorial Ileus   Nausea (chronic)  [employer] Emesis 2/2 NGT placement (resolved)  Contributors with history of narcotics, poor PO intake. Concern for malnutrition. Improvements to abdominal pain since weaning opioids. TPN has been discontinued with patient now tolerating tube feeds. Patient with chronic nausea that was previously managed with Zofran, though transitioned to phenergan due to prolonged Qtc. Upon admission to AIR, continued nightly tube feeds with Corpak NG tube in place, and PO intake as tolerated during the day. Will continue to decrease tube feeds with goal of discharge from AIR on full regular diet. If unable to tolerate full PO diet, will consider discharge home with corpak. Patient noted weight gain upon arrival to AIR (felt likely fluid retention) as weight was increased in a short period of time.   - Dietician following; appreciate care and recommendations  - Continue nightly tube feeds  - Continue PO regular diet + protein supplementation; anticipate NG tube removal prior to discharge   - Calorie count  - Daily standing weights   - Continue Protonix 40mg  BID  - Continue IV phenergan 6.5mg  Q6 PRN; Scopolamine patch ordered 9/19 in hopes of transitioning off IV  [ ]  restart home cefdinir and doxycycline upon discharge from AIR for SIBP     Syncope  Multifactorial. Primary team ddx includes: vasovagal vs adverse effect of medications vs deconditioning vs iron deficiency. Last echo 04/2022. Stable upon admission to AIR.   - CTM     IDA  Baseline hemoglobin ~9-10. Stable upon admission to AIR at 9.8. Will continue to monitor while admitted.   - M/Th CBC  - Has allergy to formula iron      [name] - Desmoid Tumors  Follows [facility] (Dr. [name]) is patient's outpatient oncologist. Per oncology, most common side effects of the chemotherapy are diarrhea, nausea, headache, and rash. Patient has not had increase in baseline nausea while taking the medication and increased dose as planned on 9/17. Will continue to monitor and discuss with outpatient provider as indicated. Noted some dry mouth and skin sensitivity on 9/24, patient reported similar symptoms with chemotherapy in the past. Started PRN ammonium lactate lotion, biotene spray, and mucositis mixture as needed. Noted some red in stool starting 9/24. No other signs/symptoms of bleeding. Hemoglobin remained stable.   - Nirogacestat 150mg  BID; increased 9/17  - M/Th CMP; monitor LFT  - Ammonium lactate lotion PRN 9/24  - Biotene spray PRN 9/24  - mucositis swish/spit PRN  [ ]  follow up with oncology; Dr. [name] 12/16/22     Enterobacteriaceae group bacteremia (Resolved) - C albicans fungemia (Resolved)  Likely due to RIJ tunneled line placed 01/2022 now removed on 8/14.  8/13, 8/17 repeat blood cultures NGTD.  No evidence of vitreitis, retinitis 8/15 per Ophtho. TTE 8/15 w possible vegetations on MV and TV or indwelling catheter; however, TEE 8/20  w/o vegetations. ID consulted during acute admission and now s/p treatment with cephalosporin and fluconazole on 8/31. Patient noted to have difficult IV access and kept R IJ access upon admission to AIR. Placed peripheral IV on 9/23 and removed central line.      Generalized anxiety disorder, depression, h/o panic attacks  Psychiatry consulted during acute hospitalization with recommendations as below.   - Consider decreasing mirtazapine 15 mg nightly to 7.5 mg nightly due to somnolence  - Valium as above, if needed could give 2.5mg  PRN for acute anxiety  - Neuropsychology consulted; appreciate care    DME  - Bedside Commode Necessity: Patient requires a bedside commode as they are confined to a single room and/or level of the home w/ no access to a bathroom on that level.   - Wheelchair Necessity: Patient has mobility limitations that interfere with performing ADLs and MADLs (a custom wheelchair will improve this), mobility limitations that cannot be corrected with a cane or walker. Patient has expressed a willingness to use a wheelchair and has adequate space in their living environment for a wheelchair. Patient has a caregiver who is willing and available to assist patient w/ wheelchair transport.  Dan Humphreys Necessity: Patient requires a rolling walker due to mobility limitation that significantly impairs their ability to participate in one or more mobility related activities of daily living in the home. A mobility limitation provides the beneficiary from accomplishing MRADL entirely. The beneficiary is able to safely use the walker.      Daily Checklist  - Diet: Regular diet + nighttime tube feeds  - DVT PPX: SQ enoxaparin; patient refused   - GI PPX: Protonix  - Access: PIV. S/p R IJ non tunneled CVC.     DISPO: Patient to be discussed at weekly interdisciplinary team conference.   - EDD: 12/10/2022  - Follow-up: PCP, Chronic Pain, Oncology (Dr. [name]), Psych    SUBJECTIVE and PROGRESS WITH FUNCTIONAL ACTIVITIES:     Interval events:  NAEON. Patient reported continued red stools, vitals have remained stable and hemoglobin. Will continue to monitor. Noted patient has GI follow up on 10/17. Patient tearful when discussing weight (gained 2lbs per last weight). Discussed the fluctuation could be due to many factors (timing of weighing has been inconsistent during hospital stay). Will change weight to be in the afternoon and patient will discuss tube feed formulation with dietician later today. Will do 1 time dose of IV lasix today for swelling. Discussed weaning pain medications starting 9/28 and patient is in agreement. Will reach out to patient's outpatient oncologist for follow up scheduling.     OBJECTIVE:     Vital signs (last 24 hours):  Temp:  [36.7 ??C (98.1 ??F)] 36.7 ??C (98.1 ??F)  Heart Rate:  [99] 99  Resp:  [16] 16  BP: (115)/(51) 115/51  MAP (mmHg):  [68] 68  SpO2:  [99 %] 99 %    Intake/Output (last 3 shifts):  I/O last 3 completed shifts:  In: 1556 [P.O.:150; I.V.:736; NG/GT:670]  Out: 1400 [Urine:1400]    Physical Exam:   GEN: Sitting in wheelchair working on putting socks on with OT, mother at bedside  EYES: sclera anicteric, conjunctiva clear   HEENT: Corpak in place L nare   CV: Warm and well perfused, trace edema in bilateral feet is stabale  RESP:  NWB on RA with [place] in place  ABD: Not distended  SKIN: Central line site without erythema, bandage in place clean/dry/intact  MSK: Not visualized today,  previously sacrum with some soft tissue swelling near lumbosacral junction   NEURO: Alert, speech fluent  PSYCH: Mood euthymic, affect appropriate    Medications:  Scheduled    ammonium lactate (LAC-HYDRIN) 12 % lotion 1 Application BID    buprenorphine-naloxone (SUBOXONE) 2-0.5 mg SL film 1 mg of buprenorphine TID    carboxymethylcellulose sodium (REFRESH CELLUVISC) 1 % ophthalmic gel 1 drop QID    diazePAM (VALIUM) tablet 2.5 mg Daily    And    diazePAM (VALIUM) tablet 2.5 mg Daily    diazePAM (VALIUM) tablet 5 mg Nightly    diclofenac sodium (VOLTAREN) 1 % gel 2 g QID    [Provider Hold] enoxaparin (LOVENOX) syringe 40 mg Q24H SCH    famotidine (PEPCID) tablet 20 mg Nightly    flu vaccine TS 2024-25(34mos up)(PF)(FLULAVAL, FLUARIX, FLUZONE) During hospitalization    lidocaine (ASPERCREME) 4 % 2 patch Daily    mirtazapine (REMERON) tablet 15 mg Nightly    multivitamin with folic acid 400 mcg tablet 1 tablet Daily    naloxegol (MOVANTIK) 12.5 mg tablet 12.5 mg Daily    nirogacestat (OGSIVEO) tablet 150 mg BID    pantoprazole (Protonix) EC tablet 40 mg BID    scopolamine (TRANSDERM-SCOP) 1 mg over 3 days topical patch 1 mg Q72H     PRN acetaminophen, 650 mg, Q6H PRN  alum-mag-simeth, 60 mL, Q6H PRN  baclofen, 10 mg, TID PRN  bisacodyl, 10 mg, BID PRN  butalbital-acetaminophen-caffeine, 1 tablet, BID PRN  carboxymethylcellulose sodium, 2 drop, QID PRN  Chemo Clarification Order, , Continuous PRN  Chemo Clarification Order, , Continuous PRN  diazePAM, 2.5 mg, Nightly PRN  diphenhydrAMINE, 25 mg, Q6H PRN  guaiFENesin, 200 mg, Q4H PRN  hydrocortisone, , BID PRN  HYDROmorphone, 0.5 mg, Q6H PRN  IP okay to treat, , Continuous PRN  lidocaine-diphenhydrAMINE-aluminum-magnesium, 10 mL, QID PRN  ondansetron, 4 mg, Q12H PRN  oxyCODONE, 10 mg, Q4H PRN   Or  oxyCODONE, 15 mg, Q4H PRN  phenol, 2 spray, Q2H PRN  polyethylene glycol, 17 g, BID PRN  promethazine, 6.5 mg, Q6H PRN  pseudoephedrine, 30 mg, Daily PRN  saliva stimulant comb. no.3, 1 spray, Q2H PRN  senna, 1 tablet, BID PRN  zinc oxide-cod liver oil, , Daily PRN        Labs/Studies: Reviewed.    Radiology Results: Reviewed    Quality Indicators      Hearing, Speech, and Vision  Ability to Hear: Adequate  Ability to See in Adequate Light: Adequate  Expression of Ideas and Wants: Without difficulty  Understanding Verbal and Non-Verbal Content: Understands    Cognitive Pattern Assessment  Cognitive Pattern Assessment Used: BIMS  Brief Interview for Mental Status (BIMS)  Repetition of Three Words (First Attempt): 3  Temporal Orientation: Year: Correct  Temporal Orientation: Month: Accurate within 5 days  Temporal Orientation: Day: Correct  Recall: Sock: Yes, no cue required  Recall: Blue: Yes, no cue required  Recall: Bed: Yes, no cue required  BIMS Summary Score: 15       Nutritional Approaches  Nutritional Approach: Feeding tube    ADLs  Admission Current   Eating Assistance Needed: Set-up / clean-up    CARE Score - 5 Assistance Needed: Independent    CARE Score - 6   Oral Hygiene  Assistance Needed: Set-up / clean-up    CARE Score - 5  Assistance Needed: Independent    CARE Score - 6   Bladder Continence       Bowel  Continence       Toileting Hygiene Assistance Needed: Physical assistance Total assistance  CARE Score - 1 Assistance Needed: Incidental touching    CARE Score - 4    Toilet Transfer Assistance Needed: Physical assistance Total assistance  CARE Score - 88 Assistance Needed: Incidental touching, Supervision    CARE Score - 4   Shower/Bathe Self Assistance Needed: Physical assistance 26%-50%  CARE Score - 88  Assistance Needed: Physical assistance    CARE Score - 3   Upper Body Dressing Assistance Needed: Physical assistance 26%-50%  CARE Score - 3  Assistance Needed: Independent    CARE Score - 6   Lower Body Dressing Assistance Needed: Physical assistance Total assistance  CARE Score - 1  Assistance Needed: Incidental touching, Supervision, Adaptive equipment    CARE Score - 4   On/Off Footwear Assistance Needed: Physical assistance Total assistance  CARE Score - 1  Assistance Needed: Physical assistance, Adaptive equipment 26%-50%  CARE Score - 3         Transfers Admission Current   Bed to Chair Assistance Needed: Physical assistance Total assistance  CARE Score - 1  Assistance Needed: Supervision    CARE Score - 4     Lying to Sitting Assistance Needed: Physical assistance Total assistance  CARE Score - 1  Assistance Needed: Physical assistance 25% or less  CARE Score - 3   Roll Left/Right Assistance Needed: Physical assistance 76% or more  CARE Score - 2  Assistance Needed: Adaptive equipment, Incidental touching    CARE Score - 4   Sit to Lying Assistance Needed: Physical assistance Total assistance  CARE Score - 1  Assistance Needed: Physical assistance 25% or less  CARE Score - 3   Sit to Stand Assistance Needed: Physical assistance Total assistance  CARE Score - 1  Assistance Needed: Incidental touching, Adaptive equipment    CARE Score - 4         Mobility Admission Current   Walk 10 Feet Assistance Needed: Physical assistance Total assistance  CARE Score - 1 Assistance Needed: Incidental touching, Adaptive equipment    CARE Score - 4     Walk 50 Feet 2 Turns Assistance Needed: Physical assistance 25% or less  CARE Score - 88 Assistance Needed: Incidental touching, Adaptive equipment    CARE Score - 4   Walk 150 Feet Assistance Needed: Physical assistance 25% or less  CARE Score - 88  Assistance Needed: Incidental touching, Adaptive equipment    CARE Score - 4   Walk 10 Feet Uneven Assistance Needed: Physical assistance 25% or less  CARE Score - 88  Assistance Needed: Incidental touching, Adaptive equipment    CARE Score - 4   1 Step (Curb) Assistance Needed: Incidental touching, Adaptive equipment    CARE Score - 88  Assistance Needed: Incidental touching, Adaptive equipment    CARE Score - 4   4 Steps Assistance Needed: Incidental touching, Adaptive equipment    CARE Score - 88  Assistance Needed: Incidental touching, Adaptive equipment    CARE Score - 4   12 Steps      CARE Score - 88       CARE Score - 88   Picking Up Object      CARE Score - 88       CARE Score - 88   Wheelchair/Scooter Use        Wheel 50 Feet 2 Turns Assistance Needed: Physical assistance Total assistance  CARE Score - Wheel 50 Feet with Two Turns: 1    Type of Wheelchair/Scooter: Manual Assistance Needed: Independent      CARE Score - Wheel 50 Feet with Two Turns: 6    Type of Wheelchair/Scooter: Manual   Wheel 150 Feet Assistance Needed: Physical assistance Total assistance    CARE Score - Wheel 150 Feet: 1    Type of Wheelchair/Scooter: Manual  Assistance Needed: Independent      CARE Score - Wheel 150 Feet: 6    Type of Wheelchair/Scooter: Manual

## 2022-12-05 NOTE — Unmapped (Signed)
Alert and oriented x4. Chronic pain managed with her current pain regimen. Ng tube is intact. Lt AC PIV is intact. On continuous NS infusion to [facility]. Pt.'s mother at the bedside. Took her meds whole with applesauce. Will give one dose of 10 mg Lasix after her therapy today. Prn IV Phenergan infusion given. Fall precautions maintained. Will continue to monitor.     Problem: Rehabilitation (IRF) Plan of Care  Goal: Plan of Care Review  Outcome: Progressing  Goal: Patient-Specific Goal (Individualized)  Outcome: Progressing  Goal: Absence of New-Onset Illness or Injury  Outcome: Progressing  Intervention: Prevent Fall and Fall Injury  Recent Flowsheet Documentation  Taken 12/05/2022 1200 by Theador Hawthorne, RN  Safety Interventions: fall reduction program maintained  Taken 12/05/2022 0800 by Theador Hawthorne, RN  Safety Interventions: fall reduction program maintained  Goal: Optimal Comfort and Wellbeing  Outcome: Progressing  Goal: Home and Community Transition Plan Established  Outcome: Progressing  Goal: Rounds/Family Conference  Outcome: Progressing     Problem: Skin Injury Risk Increased  Goal: Skin Health and Integrity  Outcome: Progressing  Intervention: Optimize Skin Protection  Recent Flowsheet Documentation  Taken 12/05/2022 1200 by Theador Hawthorne, RN  Pressure Reduction Techniques: frequent weight shift encouraged  Pressure Reduction Devices: chair cushion utilized  Taken 12/05/2022 0800 by Theador Hawthorne, RN  Pressure Reduction Techniques: frequent weight shift encouraged

## 2022-12-05 NOTE — Unmapped (Addendum)
No acute events overnight. Safety precautions maintained, mom at bedside entire shift. Pt tolerating cycled tube feeds through NG tube. Pain controlled with scheduled and prn meds. Nausea relieved with prn zofran and phenergan. PIV patent, infusing NS at [facility]. Bilateral SCDs in use while in bed. Continent of bowel and bladder, BM x1 this shift. Pt refused standing weight measurement this morning. VSS. Will continue to monitor.

## 2022-12-06 MED ADMIN — diazePAM (VALIUM) tablet 2.5 mg: 2.5 mg | ORAL | @ 13:00:00

## 2022-12-06 MED ADMIN — ondansetron (ZOFRAN) injection 4 mg: 4 mg | INTRAVENOUS | @ 16:00:00

## 2022-12-06 MED ADMIN — ammonium lactate (LAC-HYDRIN) 12 % lotion 1 Application: 1 | TOPICAL | @ 01:00:00

## 2022-12-06 MED ADMIN — ammonium lactate (LAC-HYDRIN) 12 % lotion 1 Application: 1 | TOPICAL | @ 15:00:00

## 2022-12-06 MED ADMIN — nirogacestat (OGSIVEO) tablet 150 mg: 150 mg | ORAL | @ 09:00:00 | Stop: 2022-12-26

## 2022-12-06 MED ADMIN — nirogacestat (OGSIVEO) tablet 150 mg: 150 mg | ORAL | @ 21:00:00 | Stop: 2022-12-26

## 2022-12-06 MED ADMIN — scopolamine (TRANSDERM-SCOP) 1 mg over 3 days topical patch 1 mg: 1 | TOPICAL | @ 18:00:00

## 2022-12-06 MED ADMIN — carboxymethylcellulose sodium (REFRESH CELLUVISC) 1 % ophthalmic gel 1 drop: 1 [drp] | OPHTHALMIC | @ 01:00:00

## 2022-12-06 MED ADMIN — sodium bicarbonate tablet 648 mg: 648 mg | ORAL | @ 15:00:00 | Stop: 2022-12-06

## 2022-12-06 MED ADMIN — diazePAM (VALIUM) tablet 5 mg: 5 mg | ORAL | @ 01:00:00

## 2022-12-06 MED ADMIN — buprenorphine-naloxone (SUBOXONE) 2-0.5 mg SL film 1 mg of buprenorphine: .5 | SUBLINGUAL | @ 01:00:00

## 2022-12-06 MED ADMIN — pantoprazole (Protonix) EC tablet 40 mg: 40 mg | ORAL | @ 01:00:00

## 2022-12-06 MED ADMIN — diclofenac sodium (VOLTAREN) 1 % gel 2 g: 2 g | TOPICAL | @ 01:00:00

## 2022-12-06 MED ADMIN — carboxymethylcellulose sodium (REFRESH CELLUVISC) 1 % ophthalmic gel 1 drop: 1 [drp] | OPHTHALMIC | @ 15:00:00

## 2022-12-06 MED ADMIN — buprenorphine-naloxone (SUBOXONE) 2-0.5 mg SL film 1 mg of buprenorphine: .5 | SUBLINGUAL | @ 15:00:00

## 2022-12-06 MED ADMIN — diazePAM (VALIUM) tablet 2.5 mg: 2.5 mg | ORAL | @ 18:00:00

## 2022-12-06 MED ADMIN — ondansetron (ZOFRAN) injection 4 mg: 4 mg | INTRAVENOUS | @ 01:00:00

## 2022-12-06 MED ADMIN — carboxymethylcellulose sodium (REFRESH CELLUVISC) 1 % ophthalmic gel 1 drop: 1 [drp] | OPHTHALMIC | @ 21:00:00

## 2022-12-06 MED ADMIN — HYDROmorphone (PF) (DILAUDID) injection 0.5 mg: .5 mg | INTRAVENOUS | @ 01:00:00 | Stop: 2022-12-06

## 2022-12-06 MED ADMIN — pancrelipase (Lip-Prot-Amyl) (CREON) 24,000-76,000 -120,000 unit delayed release capsule 24,000 units of lipase: 1 | ORAL | @ 15:00:00 | Stop: 2022-12-06

## 2022-12-06 MED ADMIN — pantoprazole (Protonix) EC tablet 40 mg: 40 mg | ORAL | @ 15:00:00

## 2022-12-06 MED ADMIN — oxyCODONE (ROXICODONE) immediate release tablet 15 mg: 15 mg | ORAL | @ 21:00:00 | Stop: 2022-12-10

## 2022-12-06 MED ADMIN — oxyCODONE (ROXICODONE) immediate release tablet 15 mg: 15 mg | ORAL | @ 13:00:00 | Stop: 2022-12-10

## 2022-12-06 MED ADMIN — naloxegol (MOVANTIK) 12.5 mg tablet 12.5 mg: 12.5 mg | ORAL | @ 15:00:00

## 2022-12-06 MED ADMIN — HYDROmorphone (PF) (DILAUDID) injection 0.5 mg: .5 mg | INTRAVENOUS | @ 16:00:00 | Stop: 2022-12-07

## 2022-12-06 MED ADMIN — buprenorphine-naloxone (SUBOXONE) 2-0.5 mg SL film 1 mg of buprenorphine: .5 | SUBLINGUAL | @ 20:00:00

## 2022-12-06 MED ADMIN — famotidine (PEPCID) tablet 20 mg: 20 mg | ORAL | @ 01:00:00

## 2022-12-06 NOTE — Unmapped (Signed)
Patient corflo tube was very hard to flush on both ports. Dr. Darlys Gales was aware and ordered medications to declog but unfortunately it won't even flush and instill small amount into the tube. MD ordered Xray to check the placement and wait for the next plan. Held tube feedings at this time and her prosource. Prn pain and anti-emetic medications given. Will continue to monitor.     Problem: Rehabilitation (IRF) Plan of Care  Goal: Plan of Care Review  Outcome: Progressing  Goal: Patient-Specific Goal (Individualized)  Outcome: Progressing  Goal: Absence of New-Onset Illness or Injury  Outcome: Progressing  Intervention: Prevent Fall and Fall Injury  Recent Flowsheet Documentation  Taken 12/06/2022 1400 by Theador Hawthorne, RN  Safety Interventions: fall reduction program maintained  Taken 12/06/2022 1200 by Theador Hawthorne, RN  Safety Interventions: fall reduction program maintained  Taken 12/06/2022 1000 by Theador Hawthorne, RN  Safety Interventions: fall reduction program maintained  Taken 12/06/2022 0800 by Theador Hawthorne, RN  Safety Interventions: fall reduction program maintained  Goal: Optimal Comfort and Wellbeing  Outcome: Progressing  Goal: Home and Community Transition Plan Established  Outcome: Progressing  Goal: Rounds/Family Conference  Outcome: Progressing     Problem: Skin Injury Risk Increased  Goal: Skin Health and Integrity  Outcome: Progressing  Intervention: Optimize Skin Protection  Recent Flowsheet Documentation  Taken 12/06/2022 1400 by Theador Hawthorne, RN  Pressure Reduction Techniques: frequent weight shift encouraged  Pressure Reduction Devices: chair cushion utilized  Taken 12/06/2022 1200 by Theador Hawthorne, RN  Pressure Reduction Techniques: frequent weight shift encouraged  Pressure Reduction Devices: chair cushion utilized  Taken 12/06/2022 1000 by Theador Hawthorne, RN  Pressure Reduction Techniques: frequent weight shift encouraged  Taken 12/06/2022 0800 by Theador Hawthorne, RN  Pressure Reduction Techniques: frequent weight shift encouraged  Pressure Reduction Devices: positioning supports utilized

## 2022-12-06 NOTE — Unmapped (Signed)
Pt pain and nausea controlled appropriately without any acute distress.this shift. NG tube remains in place and feeding running as per order.Family remains at bedside.Fall  precaution maintained.Encouraged to call for help when in need.Will keep monitoring.   Problem: Rehabilitation (IRF) Plan of Care  Goal: Plan of Care Review  Outcome: Ongoing - Unchanged  Goal: Patient-Specific Goal (Individualized)  Outcome: Ongoing - Unchanged  Goal: Absence of New-Onset Illness or Injury  Outcome: Ongoing - Unchanged  Intervention: Prevent Fall and Fall Injury  Recent Flowsheet Documentation  Taken 12/05/2022 2000 by Wonda Olds, RN  Safety Interventions:   bed alarm   fall reduction program maintained   lighting adjusted for tasks/safety   low bed   family at bedside  Intervention: Prevent Infection  Recent Flowsheet Documentation  Taken 12/05/2022 2000 by Wonda Olds, RN  Infection Prevention:   cohorting utilized   hand hygiene promoted  Goal: Optimal Comfort and Wellbeing  Outcome: Ongoing - Unchanged  Goal: Home and Community Transition Plan Established  Outcome: Ongoing - Unchanged  Goal: Rounds/Family Conference  Outcome: Ongoing - Unchanged     Problem: Self-Care Deficit  Goal: Improved Ability to Complete Activities of Daily Living  Outcome: Ongoing - Unchanged     Problem: Skin Injury Risk Increased  Goal: Skin Health and Integrity  Outcome: Ongoing - Unchanged     Problem: Pain Acute  Goal: Optimal Pain Control and Function  Outcome: Ongoing - Unchanged     Problem: Fall Injury Risk  Goal: Absence of Fall and Fall-Related Injury  Outcome: Ongoing - Unchanged  Intervention: Promote Injury-Free Environment  Recent Flowsheet Documentation  Taken 12/05/2022 2000 by Wonda Olds, RN  Safety Interventions:   bed alarm   fall reduction program maintained   lighting adjusted for tasks/safety   low bed   family at bedside

## 2022-12-06 NOTE — Unmapped (Signed)
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 Admitted to [facility] on 09/25/2022 for ileus, inability to tolerate PO now on supplemental tube feed, and diffuse pain due to muscle spasms of uncertain etiology.      Rehab Impairment Group Code [facility]): (Debility) 16 Debility (Non-Cardiac/Non-Pulmonary)  Etiology: Diffuse pain due to muscle spasms of uncertain etiology and cutaneous desmoid tumors    PLAN:     This patient is admitted to the Physical Medicine and Rehabilitation - Inpatient - C service from 8am-5pm on weekdays for questions regarding this patient. After hours, weekends, and holidays please contact the 1st Call resident pager     REHAB:   - PT and OT to maximize functional status with mobility and ADLs as well as prevention of joint contracture.   - Neuropsych for higher level cognitive evaluation and coping.  - RT for community re-integration, education, and leisure support services.  - Nutrition consult for diet information/teaching.   - To be discussed in weekly Interdisciplinary Team Conference.     Increased Tonicity  Dystonia   Started post-procedurally per patient ~09/25/22. Occurs intermittently throughout the day with a heightened occurrence during mobilization. At rest patient bilateral ankles in plantarflexion with worsening heel cord tightening. Has tonicity most prominent to HF and KE that is not velocity dependent. Has not noted significant improvement with increase of baclofen to 15 mg TID in setting of infection as below. Neurology previously evaluated patient seemingly ruling out an acute dystonic reaction, cord compression, central process, nutritional deficiency, stiff person syndrome and [name] after normal EMG on 8/22. Continues to be well controlled with valium, small improvement with baclofen PRN.  - Continue Baclofen 10 mg TID prn  - Continued Valium 2.5/2.5/5 mg TID and 2.5 mg nightly PRN  - PRAFO boots in bed     Headache  Currently managed with tylenol. She does have some posterior neck muscle tightness. Notes ketamine is helpful for headache pains. Describes photophobia and phonophobia with events. She does have loss of cervical lordosis on most recent MRI.   - Consider starting propranolol 5 mg BID for headache prophylaxis.   - PRN tylenol +/- prn fiorcet   - Lidocaine cream PRN to area of pain for cervicogenic headaches     Pain  Pain management consulted with recommendations as below. Patient will need to follow up with outpatient chronic pain provider ([name]). Gabapentin discontinued 9/17 and will not be continued upon discharge. Removed central line 9/23 and placed PIV. Continued on IV pain medication through PIV. Discussed discharge plan with outpatient pain provider and will start IV pain medication wean on 9/28.   - Tylenol 650 mg q6h PRN  - Dilaudid 0.5mg  IV Q6 PRN; decrease to BID PRN on 9/28 and again to daily PRN on 9/30  - Oxyocodone 10-15 mg q4h PRN  - Suboxone 2-0.5 mg TID  - Voltaren gel QID, lidocaine patches  - Continue patellar taping with therapy or obtain patellar brace to help with pain with ambulation  - Continue Movantik 12.5mg  daily; discontinue upon discharge from [facility]    Anasarca  Patient noted increased lower extremity, facial, and abdominal swelling prior to admission to AIR. Reported that pitting edema was present when taking Lyrica. Gabapentin discontinued 9/17. No current medications  with significant side effects of swelling. Requested IV Lasix as this was helpful in the past. Noted that some edema could be related to gabapentin, will plan to monitor and discontinue as indicated. On exam, patient with mild edema in bilateral feet, not appreciated elsewhere on exam. Noted improvement in edema with IV Lasix. Will continue to monitor daily and dose as needed. Oral Bumex trial 9/18 modestly helpful.    - Bumex 0.5mg  x1 9/18. Lasix IV 10mg  x1 9/19. Lasix IV 10mg  x1 9/20. Lasix IV 10mg  x1 9/27  - Daily standing afternoon weight  - Increase protein intake per dietician     Multifactorial Ileus   Nausea (chronic)  [place] Emesis 2/2 NGT placement (resolved)  Contributors with history of narcotics, poor PO intake. Concern for malnutrition. Improvements to abdominal pain since weaning opioids. TPN has been discontinued with patient now tolerating tube feeds. Patient with chronic nausea that was previously managed with Zofran, though transitioned to phenergan due to prolonged Qtc. Upon admission to AIR, continued nightly tube feeds with Corpak NG tube in place, and PO intake as tolerated during the day. Will continue to decrease tube feeds with goal of discharge from AIR on full regular diet. If unable to tolerate full PO diet, will consider discharge home with corpak. Patient noted weight gain upon arrival to AIR (felt likely fluid retention) as weight was increased in a short period of time.   - Dietician following; appreciate care and recommendations  - Continue nightly tube feeds  - Continue PO regular diet + protein supplementation; anticipate NG tube removal prior to discharge   - Calorie count  - Daily standing weights   - Continue Protonix 40mg  BID  - Continue IV phenergan 6.5mg  Q6 PRN; Scopolamine patch ordered 9/19 in hopes of transitioning off IV  [ ]  restart home cefdinir and doxycycline upon discharge from AIR for SIBP     Syncope  Multifactorial. Primary team ddx includes: vasovagal vs adverse effect of medications vs deconditioning vs iron deficiency. Last echo 04/2022. Stable upon admission to AIR.   - CTM     IDA  Baseline hemoglobin ~9-10. Stable upon admission to AIR at 9.8. Will continue to monitor while admitted.   - M/Th CBC  - Has allergy to formula iron      [name] - Desmoid Tumors  Follows [facility] (Dr. [name]) is patient's outpatient oncologist. Per oncology, most common side effects of the chemotherapy are diarrhea, nausea, headache, and rash. Patient has not had increase in baseline nausea while taking the medication and increased dose as planned on 9/17. Will continue to monitor and discuss with outpatient provider as indicated. Noted some dry mouth and skin sensitivity on 9/24, patient reported similar symptoms with chemotherapy in the past. Started PRN ammonium lactate lotion, biotene spray, and mucositis mixture as needed. Noted some red in stool starting 9/24. No other signs/symptoms of bleeding. Hemoglobin remained stable.   - Nirogacestat 150mg  BID; increased 9/17  - M/Th CMP; monitor LFT  - Ammonium lactate lotion PRN 9/24  - Biotene spray PRN 9/24  - mucositis swish/spit PRN  [ ]  follow up with oncology; Dr. [name] 12/16/22     Enterobacteriaceae group bacteremia (Resolved) - C albicans fungemia (Resolved)  Likely due to RIJ tunneled line placed 01/2022 now removed on 8/14.  8/13, 8/17 repeat blood cultures NGTD.  No evidence of vitreitis, retinitis 8/15 per Ophtho. TTE 8/15 w possible vegetations on MV and TV or indwelling catheter; however, TEE 8/20  w/o vegetations. ID consulted during acute admission and now s/p treatment with cephalosporin and fluconazole on 8/31. Patient noted to have difficult IV access and kept R IJ access upon admission to AIR. Placed peripheral IV on 9/23 and removed central line.      Generalized anxiety disorder, depression, h/o panic attacks  Psychiatry consulted during acute hospitalization with recommendations as below.   - Consider decreasing mirtazapine 15 mg nightly to 7.5 mg nightly due to somnolence  - Valium as above, if needed could give 2.5mg  PRN for acute anxiety  - Neuropsychology consulted; appreciate care    DME  - Bedside Commode Necessity: Patient requires a bedside commode as they are confined to a single room and/or level of the home w/ no access to a bathroom on that level.   - Wheelchair Necessity: Patient has mobility limitations that interfere with performing ADLs and MADLs (a custom wheelchair will improve this), mobility limitations that cannot be corrected with a cane or walker. Patient has expressed a willingness to use a wheelchair and has adequate space in their living environment for a wheelchair. Patient has a caregiver who is willing and available to assist patient w/ wheelchair transport.  Dan Humphreys Necessity: Patient requires a rolling walker due to mobility limitation that significantly impairs their ability to participate in one or more mobility related activities of daily living in the home. A mobility limitation provides the beneficiary from accomplishing MRADL entirely. The beneficiary is able to safely use the walker.      Daily Checklist  - Diet: Regular diet + nighttime tube feeds  - DVT PPX: SQ enoxaparin; patient refused   - GI PPX: Protonix  - Access: PIV. S/p R IJ non tunneled CVC.     DISPO: Patient to be discussed at weekly interdisciplinary team conference.   - EDD: 12/10/2022  - Follow-up: PCP, Chronic Pain, Oncology (Dr. [name]), Psych    SUBJECTIVE and PROGRESS WITH FUNCTIONAL ACTIVITIES:     Interval events:  9/28: Patient states she is doing okay this morning.  She notes frustration given the fact that her NG tube is clogged once again and wishes to have NG pulled and replaced.  We discussed starting with declogging procedures and should this not addressed the problem we will proceed with abdominal imaging to ensure NG placement.  She also notes scant red blood with bowel movement this morning.  This is a chronic issue that happens intermittently.  Hgb stable greater than 10.  We discussed with nursing should she have repeat episodes or increase in bloody stools we will check H&H.  All questions and concerns were addressed.    OBJECTIVE:     Vital signs (last 24 hours):  Temp:  [36.6 ??C (97.9 ??F)-36.7 ??C (98.1 ??F)] 36.6 ??C (97.9 ??F)  Heart Rate:  [79-96] 79  Resp:  [14-16] 16  BP: (115-123)/(49-57) 115/49  MAP (mmHg):  [68-76] 68  SpO2:  [100 %] 100 %    Intake/Output (last 3 shifts):  I/O last 3 completed shifts:  In: 1305.8 [P.O.:150; I.V.:265.8; NG/GT:890]  Out: 1625 [Urine:700; Stool:925]    Physical Exam:   GEN: Resting comfortably in bed with mother at bedside  EYES: sclera anicteric, conjunctiva clear   HEENT: Corpak in place L nare   CV: Warm and well perfused, trace edema in bilateral feet is stabale  RESP:  NWB on RA with [place] in place  ABD: Not distended  SKIN: Central line site without erythema, bandage in place clean/dry/intact  MSK:  Not visualized today, previously sacrum with some soft tissue swelling near lumbosacral junction   NEURO: Alert, speech fluent  PSYCH: Mood euthymic, affect appropriate    Medications:  Scheduled    ammonium lactate (LAC-HYDRIN) 12 % lotion 1 Application BID    buprenorphine-naloxone (SUBOXONE) 2-0.5 mg SL film 1 mg of buprenorphine TID    carboxymethylcellulose sodium (REFRESH CELLUVISC) 1 % ophthalmic gel 1 drop QID    diazePAM (VALIUM) tablet 2.5 mg Daily    And    diazePAM (VALIUM) tablet 2.5 mg Daily    diazePAM (VALIUM) tablet 5 mg Nightly    diclofenac sodium (VOLTAREN) 1 % gel 2 g QID    [Provider Hold] enoxaparin (LOVENOX) syringe 40 mg Q24H SCH    famotidine (PEPCID) tablet 20 mg Nightly    flu vaccine TS 2024-25(40mos up)(PF)(FLULAVAL, FLUARIX, FLUZONE) During hospitalization    lidocaine (ASPERCREME) 4 % 2 patch Daily    mirtazapine (REMERON) tablet 15 mg Nightly    multivitamin with folic acid 400 mcg tablet 1 tablet Daily    naloxegol (MOVANTIK) 12.5 mg tablet 12.5 mg Daily    nirogacestat (OGSIVEO) tablet 150 mg BID    pantoprazole (Protonix) EC tablet 40 mg BID    scopolamine (TRANSDERM-SCOP) 1 mg over 3 days topical patch 1 mg Q72H     PRN acetaminophen, 650 mg, Q6H PRN  alum-mag-simeth, 60 mL, Q6H PRN  baclofen, 10 mg, TID PRN  bisacodyl, 10 mg, BID PRN  butalbital-acetaminophen-caffeine, 1 tablet, BID PRN  carboxymethylcellulose sodium, 2 drop, QID PRN  Chemo Clarification Order, , Continuous PRN  Chemo Clarification Order, , Continuous PRN  diazePAM, 2.5 mg, Nightly PRN  diphenhydrAMINE, 25 mg, Q6H PRN  guaiFENesin, 200 mg, Q4H PRN  hydrocortisone, , BID PRN  HYDROmorphone, 0.5 mg, BID PRN   Followed by  [START ON 12/08/2022] HYDROmorphone, 0.5 mg, Daily PRN  IP okay to treat, , Continuous PRN  lidocaine-diphenhydrAMINE-aluminum-magnesium, 10 mL, QID PRN  ondansetron, 4 mg, Q12H PRN  oxyCODONE, 10 mg, Q4H PRN   Or  oxyCODONE, 15 mg, Q4H PRN  phenol, 2 spray, Q2H PRN  polyethylene glycol, 17 g, BID PRN  promethazine, 6.5 mg, Q6H PRN  pseudoephedrine, 30 mg, Daily PRN  saliva stimulant comb. no.3, 1 spray, Q2H PRN  senna, 1 tablet, BID PRN  zinc oxide-cod liver oil, , Daily PRN        Labs/Studies: Reviewed.    Radiology Results: Reviewed    Quality Indicators      Hearing, Speech, and Vision  Ability to Hear: Adequate  Ability to See in Adequate Light: Adequate  Expression of Ideas and Wants: Without difficulty  Understanding Verbal and Non-Verbal Content: Understands    Cognitive Pattern Assessment  Cognitive Pattern Assessment Used: BIMS  Brief Interview for Mental Status (BIMS)  Repetition of Three Words (First Attempt): 3  Temporal Orientation: Year: Correct  Temporal Orientation: Month: Accurate within 5 days  Temporal Orientation: Day: Correct  Recall: Sock: Yes, no cue required  Recall: Blue: Yes, no cue required  Recall: Bed: Yes, no cue required  BIMS Summary Score: 15       Nutritional Approaches  Nutritional Approach: Feeding tube    ADLs  Admission Current   Eating Assistance Needed: Set-up / clean-up    CARE Score - 5 Assistance Needed: Independent    CARE Score - 6   Oral Hygiene  Assistance Needed: Set-up / clean-up    CARE Score - 5  Assistance Needed: Independent  CARE Score - 6   Bladder Continence       Bowel Continence Toileting Hygiene Assistance Needed: Physical assistance Total assistance  CARE Score - 1 Assistance Needed: Incidental touching, Supervision    CARE Score - 4    Toilet Transfer Assistance Needed: Physical assistance Total assistance  CARE Score - 88 Assistance Needed: Supervision    CARE Score - 4   Shower/Bathe Self Assistance Needed: Physical assistance 26%-50%  CARE Score - 88  Assistance Needed: Physical assistance    CARE Score - 3   Upper Body Dressing Assistance Needed: Physical assistance 26%-50%  CARE Score - 3  Assistance Needed: Set-up / clean-up    CARE Score - 5   Lower Body Dressing Assistance Needed: Physical assistance Total assistance  CARE Score - 1  Assistance Needed: Incidental touching, Supervision, Adaptive equipment    CARE Score - 4   On/Off Footwear Assistance Needed: Physical assistance Total assistance  CARE Score - 1  Assistance Needed: Independent, Adaptive equipment    CARE Score - 6         Transfers Admission Current   Bed to Chair Assistance Needed: Physical assistance Total assistance  CARE Score - 1  Assistance Needed: Supervision    CARE Score - 4     Lying to Sitting Assistance Needed: Physical assistance Total assistance  CARE Score - 1  Assistance Needed: Physical assistance 25% or less  CARE Score - 3   Roll Left/Right Assistance Needed: Physical assistance 76% or more  CARE Score - 2  Assistance Needed: Adaptive equipment, Incidental touching    CARE Score - 4   Sit to Lying Assistance Needed: Physical assistance Total assistance  CARE Score - 1  Assistance Needed: Physical assistance 25% or less  CARE Score - 3   Sit to Stand Assistance Needed: Physical assistance Total assistance  CARE Score - 1  Assistance Needed: Incidental touching, Adaptive equipment    CARE Score - 4         Mobility Admission Current   Walk 10 Feet Assistance Needed: Physical assistance Total assistance  CARE Score - 1 Assistance Needed: Incidental touching, Adaptive equipment    CARE Score - 4 Walk 50 Feet 2 Turns Assistance Needed: Physical assistance 25% or less  CARE Score - 88 Assistance Needed: Incidental touching, Adaptive equipment    CARE Score - 4   Walk 150 Feet Assistance Needed: Physical assistance 25% or less  CARE Score - 88  Assistance Needed: Incidental touching, Adaptive equipment    CARE Score - 4   Walk 10 Feet Uneven Assistance Needed: Physical assistance 25% or less  CARE Score - 88  Assistance Needed: Incidental touching, Adaptive equipment    CARE Score - 4   1 Step (Curb) Assistance Needed: Incidental touching, Adaptive equipment    CARE Score - 88  Assistance Needed: Incidental touching, Adaptive equipment    CARE Score - 4   4 Steps Assistance Needed: Incidental touching, Adaptive equipment    CARE Score - 88  Assistance Needed: Incidental touching, Adaptive equipment    CARE Score - 4   12 Steps      CARE Score - 88       CARE Score - 88   Picking Up Object      CARE Score - 88       CARE Score - 88   Wheelchair/Scooter Use        Wheel 50 Feet 2 Turns Assistance Needed: Physical  assistance Total assistance    CARE Score - Wheel 50 Feet with Two Turns: 1    Type of Wheelchair/Scooter: Manual Assistance Needed: Independent      CARE Score - Wheel 50 Feet with Two Turns: 6    Type of Wheelchair/Scooter: Manual   Wheel 150 Feet Assistance Needed: Physical assistance Total assistance    CARE Score - Wheel 150 Feet: 1    Type of Wheelchair/Scooter: Manual  Assistance Needed: Independent      CARE Score - Wheel 150 Feet: 6    Type of Wheelchair/Scooter: Manual

## 2022-12-07 MED ADMIN — HYDROmorphone (PF) (DILAUDID) injection 0.5 mg: .5 mg | INTRAVENOUS | @ 17:00:00 | Stop: 2022-12-07

## 2022-12-07 MED ADMIN — pantoprazole (Protonix) EC tablet 40 mg: 40 mg | ORAL | @ 14:00:00

## 2022-12-07 MED ADMIN — multivitamin with folic acid 400 mcg tablet 1 tablet: 1 | ORAL | @ 14:00:00

## 2022-12-07 MED ADMIN — naloxegol (MOVANTIK) 12.5 mg tablet 12.5 mg: 12.5 mg | ORAL | @ 14:00:00

## 2022-12-07 MED ADMIN — famotidine (PEPCID) tablet 20 mg: 20 mg | ORAL | @ 01:00:00

## 2022-12-07 MED ADMIN — oxyCODONE (ROXICODONE) immediate release tablet 15 mg: 15 mg | ORAL | @ 21:00:00 | Stop: 2022-12-10

## 2022-12-07 MED ADMIN — pantoprazole (Protonix) EC tablet 40 mg: 40 mg | ORAL | @ 01:00:00

## 2022-12-07 MED ADMIN — HYDROmorphone (PF) (DILAUDID) injection 0.5 mg: .5 mg | INTRAVENOUS | @ 02:00:00 | Stop: 2022-12-07

## 2022-12-07 MED ADMIN — diazePAM (VALIUM) tablet 2.5 mg: 2.5 mg | ORAL | @ 18:00:00

## 2022-12-07 MED ADMIN — nirogacestat (OGSIVEO) tablet 150 mg: 150 mg | ORAL | @ 21:00:00 | Stop: 2022-12-26

## 2022-12-07 MED ADMIN — carboxymethylcellulose sodium (REFRESH CELLUVISC) 1 % ophthalmic gel 1 drop: 1 [drp] | OPHTHALMIC | @ 21:00:00

## 2022-12-07 MED ADMIN — ondansetron (ZOFRAN) injection 4 mg: 4 mg | INTRAVENOUS | @ 14:00:00

## 2022-12-07 MED ADMIN — diazePAM (VALIUM) tablet 5 mg: 5 mg | ORAL | @ 01:00:00

## 2022-12-07 MED ADMIN — diazePAM (VALIUM) tablet 2.5 mg: 2.5 mg | ORAL | @ 14:00:00

## 2022-12-07 MED ADMIN — buprenorphine-naloxone (SUBOXONE) 2-0.5 mg SL film 1 mg of buprenorphine: .5 | SUBLINGUAL | @ 14:00:00

## 2022-12-07 MED ADMIN — buprenorphine-naloxone (SUBOXONE) 2-0.5 mg SL film 1 mg of buprenorphine: .5 | SUBLINGUAL | @ 18:00:00

## 2022-12-07 MED ADMIN — buprenorphine-naloxone (SUBOXONE) 2-0.5 mg SL film 1 mg of buprenorphine: .5 | SUBLINGUAL | @ 01:00:00

## 2022-12-07 MED ADMIN — nirogacestat (OGSIVEO) tablet 150 mg: 150 mg | ORAL | @ 10:00:00 | Stop: 2022-12-26

## 2022-12-07 MED ADMIN — mirtazapine (REMERON) tablet 15 mg: 15 mg | GASTROENTERAL | @ 01:00:00

## 2022-12-07 MED ADMIN — carboxymethylcellulose sodium (REFRESH CELLUVISC) 1 % ophthalmic gel 1 drop: 1 [drp] | OPHTHALMIC | @ 01:00:00

## 2022-12-07 MED ADMIN — oxyCODONE (ROXICODONE) immediate release tablet 15 mg: 15 mg | ORAL | @ 14:00:00 | Stop: 2022-12-10

## 2022-12-07 MED ADMIN — ammonium lactate (LAC-HYDRIN) 12 % lotion 1 Application: 1 | TOPICAL | @ 01:00:00

## 2022-12-07 MED ADMIN — promethazine (PHENERGAN) 6.5 mg in sodium chloride (NS) 0.9 % 50 mL IVPB: 6.5 mg | INTRAVENOUS | @ 19:00:00

## 2022-12-07 MED ADMIN — promethazine (PHENERGAN) 6.5 mg in sodium chloride (NS) 0.9 % 50 mL IVPB: 6.5 mg | INTRAVENOUS | @ 01:00:00

## 2022-12-07 NOTE — Unmapped (Signed)
1st Admission 09/25/22 for Acute Postprocedural Pain.  Admitted into Rehab 9/11    Double lumen R internal jugular 01/10/22-10/22/22  Triple lumen R internal jugular 10/22/22-12/01/22    L FA 09/28/22 -10/10/22  -12days, USG TJ  L Wrist 10/10/22 - 10/11/22  - 1 days, [place], TJ VAT  L FA 10/11/22 - 10/15/22 - 4 days, USG MM VAT  R FA 10/15/22 - 10/22/22 - 7days, KJ USG VAT  R FA 10/21/22 - 10/21/22 - 0 days, USG TS VAT    Currently Has L AC 12/01/22 -  Day 6, USG BC    ULTRASOUND RESOURCE RN RECOMMENDATION     Indication:  Poor Venous Access    The Ultrasound Resource RN has assessed this patient for the placement of a PIV. A total of 0 attempts were made without success.       Currently, the patient has insufficient vasculature to support the insertion or maintenance of PIV???s. At this time it is recommend CVAD placement or dermal patches.  Currently, pt only has dilaudid and phenergan IV orders, which could be transitioned to patches.       Harrison Mons, RN was notified.    Thank you,       Dennis Bast, RN RN Ultrasound Resource RN       Workup / Procedure Time:  30 minutes

## 2022-12-07 NOTE — Unmapped (Signed)
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 Admitted to [facility] on 09/25/2022 for ileus, inability to tolerate PO now on supplemental tube feed, and diffuse pain due to muscle spasms of uncertain etiology.      Rehab Impairment Group Code [facility]): (Debility) 16 Debility (Non-Cardiac/Non-Pulmonary)  Etiology: Diffuse pain due to muscle spasms of uncertain etiology and cutaneous desmoid tumors    PLAN:     This patient is admitted to the Physical Medicine and Rehabilitation - Inpatient - C service from 8am-5pm on weekdays for questions regarding this patient. After hours, weekends, and holidays please contact the 1st Call resident pager     REHAB:   - PT and OT to maximize functional status with mobility and ADLs as well as prevention of joint contracture.   - Neuropsych for higher level cognitive evaluation and coping.  - RT for community re-integration, education, and leisure support services.  - Nutrition consult for diet information/teaching.   - To be discussed in weekly Interdisciplinary Team Conference.     Increased Tonicity  Dystonia   Started post-procedurally per patient ~09/25/22. Occurs intermittently throughout the day with a heightened occurrence during mobilization. At rest patient bilateral ankles in plantarflexion with worsening heel cord tightening. Has tonicity most prominent to HF and KE that is not velocity dependent. Has not noted significant improvement with increase of baclofen to 15 mg TID in setting of infection as below. Neurology previously evaluated patient seemingly ruling out an acute dystonic reaction, cord compression, central process, nutritional deficiency, stiff person syndrome and [name] after normal EMG on 8/22. Continues to be well controlled with valium, small improvement with baclofen PRN.  - Continue Baclofen 10 mg TID prn  - Continued Valium 2.5/2.5/5 mg TID and 2.5 mg nightly PRN  - PRAFO boots in bed     Headache  Currently managed with tylenol. She does have some posterior neck muscle tightness. Notes ketamine is helpful for headache pains. Describes photophobia and phonophobia with events. She does have loss of cervical lordosis on most recent MRI.   - Consider starting propranolol 5 mg BID for headache prophylaxis.   - PRN tylenol +/- prn fiorcet   - Lidocaine cream PRN to area of pain for cervicogenic headaches     Pain  Pain management consulted with recommendations as below. Patient will need to follow up with outpatient chronic pain provider ([name]). Gabapentin discontinued 9/17 and will not be continued upon discharge. Removed central line 9/23 and placed PIV. Continued on IV pain medication through PIV. Discussed discharge plan with outpatient pain provider and will start IV pain medication wean on 9/28.   - Tylenol 650 mg q6h PRN  - Dilaudid 0.5mg  IV Q6 PRN; decrease to BID PRN on 9/28 and again to daily PRN on 9/30  - Oxyocodone 10-15 mg q4h PRN  - Suboxone 2-0.5 mg TID  - Voltaren gel QID, lidocaine patches  - Continue patellar taping with therapy or obtain patellar brace to help with pain with ambulation  - Continue Movantik 12.5mg  daily; discontinue upon discharge from AIR    Anasarca  Patient noted increased lower extremity, facial, and abdominal swelling prior to admission to [facility]. Reported that pitting edema was present when taking Lyrica. Gabapentin discontinued 9/17. No current medications  with significant side effects of swelling. Requested IV Lasix as this was helpful in the past. Noted that some edema could be related to gabapentin, will plan to monitor and discontinue as indicated. On exam, patient with mild edema in bilateral feet, not appreciated elsewhere on exam. Noted improvement in edema with IV Lasix. Will continue to monitor daily and dose as needed. Oral Bumex trial 9/18 modestly helpful.    - Bumex 0.5mg  x1 9/18. Lasix IV 10mg  x1 9/19. Lasix IV 10mg  x1 9/20. Lasix IV 10mg  x1 9/27  - Daily standing afternoon weight  - Increase protein intake per dietician     Multifactorial Ileus   Nausea (chronic)  [employer] Emesis 2/2 NGT placement (resolved)  Contributors with history of narcotics, poor PO intake. Concern for malnutrition. Improvements to abdominal pain since weaning opioids. TPN has been discontinued with patient now tolerating tube feeds. Patient with chronic nausea that was previously managed with Zofran, though transitioned to phenergan due to prolonged Qtc. Upon admission to AIR, continued nightly tube feeds with Corpak NG tube in place, and PO intake as tolerated during the day. Will continue to decrease tube feeds with goal of discharge from AIR on full regular diet. If unable to tolerate full PO diet, will consider discharge home with corpak. Patient noted weight gain upon arrival to AIR (felt likely fluid retention) as weight was increased in a short period of time.   - Dietician following; appreciate care and recommendations  - Continue nightly tube feeds  - Continue PO regular diet + protein supplementation; anticipate NG tube removal prior to discharge   - Calorie count  - Daily standing weights   - Continue Protonix 40mg  BID  - Continue IV phenergan 6.5mg  Q6 PRN; Scopolamine patch ordered 9/19 in hopes of transitioning off IV  [ ]  restart home cefdinir and doxycycline upon discharge from AIR for SIBP     Syncope  Multifactorial. Primary team ddx includes: vasovagal vs adverse effect of medications vs deconditioning vs iron deficiency. Last echo 04/2022. Stable upon admission to AIR.   - CTM     IDA  Baseline hemoglobin ~9-10. Stable upon admission to AIR at 9.8. Will continue to monitor while admitted.   - M/Th CBC  - Has allergy to formula iron      [name] - Desmoid Tumors  Follows [facility] (Dr. [name]) is patient's outpatient oncologist. Per oncology, most common side effects of the chemotherapy are diarrhea, nausea, headache, and rash. Patient has not had increase in baseline nausea while taking the medication and increased dose as planned on 9/17. Will continue to monitor and discuss with outpatient provider as indicated. Noted some dry mouth and skin sensitivity on 9/24, patient reported similar symptoms with chemotherapy in the past. Started PRN ammonium lactate lotion, biotene spray, and mucositis mixture as needed. Noted some red in stool starting 9/24. No other signs/symptoms of bleeding. Hemoglobin remained stable.   - Nirogacestat 150mg  BID; increased 9/17  - M/Th CMP; monitor LFT  - Ammonium lactate lotion PRN 9/24  - Biotene spray PRN 9/24  - mucositis swish/spit PRN  [ ]  follow up with oncology; Dr. [name] 12/16/22     Enterobacteriaceae group bacteremia (Resolved) - C albicans fungemia (Resolved)  Likely due to RIJ tunneled line placed 01/2022 now removed on 8/14.  8/13, 8/17 repeat blood cultures NGTD.  No evidence of vitreitis, retinitis 8/15 per Ophtho. TTE 8/15 w possible vegetations on MV and TV or indwelling catheter; however, TEE 8/20  w/o vegetations. ID consulted during acute admission and now s/p treatment with cephalosporin and fluconazole on 8/31. Patient noted to have difficult IV access and kept R IJ access upon admission to AIR. Placed peripheral IV on 9/23 and removed central line.      Generalized anxiety disorder, depression, h/o panic attacks  Psychiatry consulted during acute hospitalization with recommendations as below.   - Consider decreasing mirtazapine 15 mg nightly to 7.5 mg nightly due to somnolence  - Valium as above, if needed could give 2.5mg  PRN for acute anxiety  - Neuropsychology consulted; appreciate care    DME  - Bedside Commode Necessity: Patient requires a bedside commode as they are confined to a single room and/or level of the home w/ no access to a bathroom on that level.   - Wheelchair Necessity: Patient has mobility limitations that interfere with performing ADLs and MADLs (a custom wheelchair will improve this), mobility limitations that cannot be corrected with a cane or walker. Patient has expressed a willingness to use a wheelchair and has adequate space in their living environment for a wheelchair. Patient has a caregiver who is willing and available to assist patient w/ wheelchair transport.  Dan Humphreys Necessity: Patient requires a rolling walker due to mobility limitation that significantly impairs their ability to participate in one or more mobility related activities of daily living in the home. A mobility limitation provides the beneficiary from accomplishing MRADL entirely. The beneficiary is able to safely use the walker.      Daily Checklist  - Diet: Regular diet + nighttime tube feeds  - DVT PPX: SQ enoxaparin; patient refused   - GI PPX: Protonix  - Access: PIV. S/p R IJ non tunneled CVC.     DISPO: Patient to be discussed at weekly interdisciplinary team conference.   - EDD: 12/10/2022  - Follow-up: PCP, Chronic Pain, Oncology (Dr. [name]), Psych    SUBJECTIVE and PROGRESS WITH FUNCTIONAL ACTIVITIES:     Interval events:  9/28: Patient states she is doing okay this morning.  She notes frustration given the fact that her NG tube is clogged once again and wishes to have NG pulled and replaced.  We discussed starting with declogging procedures and should this not addressed the problem we will proceed with abdominal imaging to ensure NG placement.  She also notes scant red blood with bowel movement this morning.  This is a chronic issue that happens intermittently.  Hgb stable greater than 10.  We discussed with nursing should she have repeat episodes or increase in bloody stools we will check H&H.  All questions and concerns were addressed.  9/29: Mrs. Zia notes that she had difficulty sleeping last night. We discussed her NG tube well-positioned in the duodenum and due to inability to check residuals or flush NG tube as well as failure of declogging kit we will remove and replace NG tube today.  All other questions and concerns were addressed.    OBJECTIVE:     Vital signs (last 24 hours):  Temp:  [36.8 ??C (98.2 ??F)-36.9 ??C (98.4 ??F)] 36.9 ??C (98.4 ??F)  Heart Rate:  [97-104] 97  Resp:  [16-18] 18  BP: (126-130)/(67-81) 130/67  MAP (mmHg):  [85-94] 85  SpO2:  [97 %-98 %] 97 %    Intake/Output (last 3 shifts):  I/O last 3 completed shifts:  In: 305 [P.O.:30; I.V.:175; NG/GT:100]  Out: 2125 [Urine:1200; Stool:925]    Physical Exam:   GEN: Resting comfortably in bed with mother at  bedside  EYES: sclera anicteric, conjunctiva clear   HEENT: Corpak in place L nare   CV: Warm and well perfused, trace edema in bilateral feet is stabale  RESP:  NWB on RA with Mallard in place  ABD: Not distended  SKIN: Central line site without erythema, bandage in place clean/dry/intact  MSK: Not visualized today, previously sacrum with some soft tissue swelling near lumbosacral junction   NEURO: Alert, speech fluent  PSYCH: Mood euthymic, affect appropriate    Medications:  Scheduled    ammonium lactate (LAC-HYDRIN) 12 % lotion 1 Application BID    buprenorphine-naloxone (SUBOXONE) 2-0.5 mg SL film 1 mg of buprenorphine TID    carboxymethylcellulose sodium (REFRESH CELLUVISC) 1 % ophthalmic gel 1 drop QID    diazePAM (VALIUM) tablet 2.5 mg Daily    And    diazePAM (VALIUM) tablet 2.5 mg Daily    diazePAM (VALIUM) tablet 5 mg Nightly    diclofenac sodium (VOLTAREN) 1 % gel 2 g QID    [Provider Hold] enoxaparin (LOVENOX) syringe 40 mg Q24H SCH    famotidine (PEPCID) tablet 20 mg Nightly    flu vaccine TS 2024-25(48mos up)(PF)(FLULAVAL, FLUARIX, FLUZONE) During hospitalization    lidocaine (ASPERCREME) 4 % 2 patch Daily    mirtazapine (REMERON) tablet 15 mg Nightly    multivitamin with folic acid 400 mcg tablet 1 tablet Daily    naloxegol (MOVANTIK) 12.5 mg tablet 12.5 mg Daily    nirogacestat (OGSIVEO) tablet 150 mg BID    pantoprazole (Protonix) EC tablet 40 mg BID    scopolamine (TRANSDERM-SCOP) 1 mg over 3 days topical patch 1 mg Q72H     PRN acetaminophen, 650 mg, Q6H PRN  alum-mag-simeth, 60 mL, Q6H PRN  baclofen, 10 mg, TID PRN  bisacodyl, 10 mg, BID PRN  butalbital-acetaminophen-caffeine, 1 tablet, BID PRN  carboxymethylcellulose sodium, 2 drop, QID PRN  Chemo Clarification Order, , Continuous PRN  Chemo Clarification Order, , Continuous PRN  diazePAM, 2.5 mg, Nightly PRN  diphenhydrAMINE, 25 mg, Q6H PRN  guaiFENesin, 200 mg, Q4H PRN  hydrocortisone, , BID PRN  HYDROmorphone, 0.5 mg, BID PRN   Followed by  [START ON 12/08/2022] HYDROmorphone, 0.5 mg, Daily PRN  IP okay to treat, , Continuous PRN  lidocaine-diphenhydrAMINE-aluminum-magnesium, 10 mL, QID PRN  ondansetron, 4 mg, Q12H PRN  oxyCODONE, 10 mg, Q4H PRN   Or  oxyCODONE, 15 mg, Q4H PRN  phenol, 2 spray, Q2H PRN  polyethylene glycol, 17 g, BID PRN  promethazine, 6.5 mg, Q6H PRN  pseudoephedrine, 30 mg, Daily PRN  saliva stimulant comb. no.3, 1 spray, Q2H PRN  senna, 1 tablet, BID PRN  zinc oxide-cod liver oil, , Daily PRN        Labs/Studies: Reviewed.    Radiology Results: Reviewed    Quality Indicators      Hearing, Speech, and Vision  Ability to Hear: Adequate  Ability to See in Adequate Light: Adequate  Expression of Ideas and Wants: Without difficulty  Understanding Verbal and Non-Verbal Content: Understands    Cognitive Pattern Assessment  Cognitive Pattern Assessment Used: BIMS  Brief Interview for Mental Status (BIMS)  Repetition of Three Words (First Attempt): 3  Temporal Orientation: Year: Correct  Temporal Orientation: Month: Accurate within 5 days  Temporal Orientation: Day: Correct  Recall: Sock: Yes, no cue required  Recall: Blue: Yes, no cue required  Recall: Bed: Yes, no cue required  BIMS Summary Score: 15       Nutritional Approaches  Nutritional Approach: Feeding  tube    ADLs  Admission Current   Eating Assistance Needed: Set-up / clean-up    CARE Score - 5 Assistance Needed: Independent    CARE Score - 6   Oral Hygiene  Assistance Needed: Set-up / clean-up    CARE Score - 5  Assistance Needed: Independent    CARE Score - 6   Bladder Continence       Bowel Continence       Toileting Hygiene Assistance Needed: Physical assistance Total assistance  CARE Score - 1 Assistance Needed: Incidental touching, Supervision    CARE Score - 4    Toilet Transfer Assistance Needed: Physical assistance Total assistance  CARE Score - 88 Assistance Needed: Supervision    CARE Score - 4   Shower/Bathe Self Assistance Needed: Physical assistance 26%-50%  CARE Score - 88  Assistance Needed: Physical assistance    CARE Score - 3   Upper Body Dressing Assistance Needed: Physical assistance 26%-50%  CARE Score - 3  Assistance Needed: Set-up / clean-up    CARE Score - 5   Lower Body Dressing Assistance Needed: Physical assistance Total assistance  CARE Score - 1  Assistance Needed: Incidental touching, Supervision, Adaptive equipment    CARE Score - 4   On/Off Footwear Assistance Needed: Physical assistance Total assistance  CARE Score - 1  Assistance Needed: Independent, Adaptive equipment    CARE Score - 6         Transfers Admission Current   Bed to Chair Assistance Needed: Physical assistance Total assistance  CARE Score - 1  Assistance Needed: Supervision    CARE Score - 4     Lying to Sitting Assistance Needed: Physical assistance Total assistance  CARE Score - 1  Assistance Needed: Physical assistance    CARE Score - 3   Roll Left/Right Assistance Needed: Physical assistance 76% or more  CARE Score - 2  Assistance Needed: Adaptive equipment, Incidental touching    CARE Score - 4   Sit to Lying Assistance Needed: Physical assistance Total assistance  CARE Score - 1  Assistance Needed: Physical assistance    CARE Score - 3   Sit to Stand Assistance Needed: Physical assistance Total assistance  CARE Score - 1  Assistance Needed: Incidental touching, Adaptive equipment    CARE Score - 4         Mobility Admission Current   Walk 10 Feet Assistance Needed: Physical assistance Total assistance  CARE Score - 1 Assistance Needed: Incidental touching, Adaptive equipment    CARE Score - 4     Walk 50 Feet 2 Turns Assistance Needed: Physical assistance 25% or less  CARE Score - 88 Assistance Needed: Incidental touching, Adaptive equipment    CARE Score - 4   Walk 150 Feet Assistance Needed: Physical assistance 25% or less  CARE Score - 88  Assistance Needed: Incidental touching, Adaptive equipment    CARE Score - 4   Walk 10 Feet Uneven Assistance Needed: Physical assistance 25% or less  CARE Score - 88  Assistance Needed: Incidental touching, Adaptive equipment    CARE Score - 4   1 Step (Curb) Assistance Needed: Incidental touching, Adaptive equipment    CARE Score - 88  Assistance Needed: Incidental touching, Adaptive equipment    CARE Score - 4   4 Steps Assistance Needed: Incidental touching, Adaptive equipment    CARE Score - 88  Assistance Needed: Incidental touching, Adaptive equipment    CARE Score - 4  12 Steps      CARE Score - 88       CARE Score - 88   Picking Up Object      CARE Score - 88       CARE Score - 88   Wheelchair/Scooter Use        Wheel 50 Feet 2 Turns Assistance Needed: Physical assistance Total assistance    CARE Score - Wheel 50 Feet with Two Turns: 1    Type of Wheelchair/Scooter: Manual Assistance Needed: Independent      CARE Score - Wheel 50 Feet with Two Turns: 6    Type of Wheelchair/Scooter: Manual   Wheel 150 Feet Assistance Needed: Physical assistance Total assistance    CARE Score - Wheel 150 Feet: 1    Type of Wheelchair/Scooter: Manual  Assistance Needed: Independent      CARE Score - Wheel 150 Feet: 6    Type of Wheelchair/Scooter: Manual

## 2022-12-07 NOTE — Unmapped (Addendum)
Pt is awake and oriented x 4, on room air, no tele. Takes meds whole with applesauce. Pt complained about pain overnight and was medicated with prn dilaudid. Continent x 2, LBM was yesterday, 9/28. Able to make needs known. Pt is up to the recliner chair and back to bed. Tube feeding was stopped because of the clogged NG line. Pt has a 22 in the left forearm. Skin, fall, safety and aspiration precautions maintained overnight. Call bell is within reach. Bed is locked and in the lowest position possible. Will continue to monitor the pt.    Problem: Rehabilitation ([facility]) Plan of Care  Goal: Plan of Care Review  Outcome: Progressing  Goal: Patient-Specific Goal (Individualized)  Outcome: Progressing  Goal: Absence of New-Onset Illness or Injury  Outcome: Progressing  Intervention: Prevent Skin Injury  Recent Flowsheet Documentation  Taken 12/06/2022 2000 by Jari Sportsman, RN  Device Skin Pressure Protection: absorbent pad utilized/changed  Skin Protection: incontinence pads utilized  Goal: Optimal Comfort and Wellbeing  Outcome: Progressing  Goal: Home and Community Transition Plan Established  Outcome: Progressing  Goal: Rounds/Family Conference  Outcome: Progressing     Problem: Self-Care Deficit  Goal: Improved Ability to Complete Activities of Daily Living  Outcome: Progressing     Problem: Skin Injury Risk Increased  Goal: Skin Health and Integrity  Outcome: Progressing  Intervention: Optimize Skin Protection  Recent Flowsheet Documentation  Taken 12/06/2022 2000 by Jari Sportsman, RN  Pressure Reduction Techniques: frequent weight shift encouraged  Pressure Reduction Devices: positioning supports utilized  Skin Protection: incontinence pads utilized     Problem: Pain Acute  Goal: Optimal Pain Control and Function  Outcome: Progressing     Problem: Fall Injury Risk  Goal: Absence of Fall and Fall-Related Injury  Outcome: Progressing

## 2022-12-07 NOTE — Unmapped (Addendum)
Pt A/Ox4, calm and cooperative. Denies chest pain or SOB. Corflo inserted at bedside by Dr. Darlys Gales, KUB xray done to check placement, Dr. Darlys Gales advanced the tube to be at 75cm on left nares, secured in place. Repeat KUB done. Encouraged OOB. Safety precautions implemented and maintained. Pts BM has some bloody streaks as stated by the pt and her mother, Dr. Darlys Gales informed.   Problem: Rehabilitation (IRF) Plan of Care  Goal: Plan of Care Review  Outcome: Progressing  Flowsheets (Taken 12/07/2022 1539)  Progress: improving  Plan of Care Reviewed With: patient  Goal: Patient-Specific Goal (Individualized)  Outcome: Progressing  Goal: Absence of New-Onset Illness or Injury  Outcome: Progressing  Intervention: Prevent Fall and Fall Injury  Recent Flowsheet Documentation  Taken 12/07/2022 1400 by Harrison Mons, RN  Safety Interventions:   aspiration precautions   assistive device   fall reduction program maintained   family at bedside   lighting adjusted for tasks/safety   low bed   mobility aid   nonskid shoes/slippers when out of bed  Taken 12/07/2022 1200 by Harrison Mons, RN  Safety Interventions:   aspiration precautions   assistive device   family at bedside   fall reduction program maintained   lighting adjusted for tasks/safety   mobility aid   low bed   nonskid shoes/slippers when out of bed  Taken 12/07/2022 1000 by Harrison Mons, RN  Safety Interventions:   aspiration precautions   fall reduction program maintained   family at bedside   lighting adjusted for tasks/safety   low bed   nonskid shoes/slippers when out of bed  Taken 12/07/2022 0800 by Harrison Mons, RN  Safety Interventions:   aspiration precautions   fall reduction program maintained   family at bedside  Intervention: Prevent Skin Injury  Recent Flowsheet Documentation  Taken 12/07/2022 1400 by Harrison Mons, RN  Device Skin Pressure Protection: adhesive use limited  Skin Protection: adhesive use limited  Taken 12/07/2022 1200 by Harrison Mons, RN  Device Skin Pressure Protection: adhesive use limited  Skin Protection: adhesive use limited  Taken 12/07/2022 1000 by Harrison Mons, RN  Device Skin Pressure Protection: adhesive use limited  Skin Protection:   adhesive use limited   incontinence pads utilized  Taken 12/07/2022 0800 by Harrison Mons, RN  Device Skin Pressure Protection: adhesive use limited  Skin Protection:   adhesive use limited   incontinence pads utilized  Intervention: Prevent Infection  Recent Flowsheet Documentation  Taken 12/07/2022 1400 by Harrison Mons, RN  Infection Prevention:   hand hygiene promoted   personal protective equipment utilized  Taken 12/07/2022 1200 by Harrison Mons, RN  Infection Prevention:   hand hygiene promoted   personal protective equipment utilized  Taken 12/07/2022 1000 by Harrison Mons, RN  Infection Prevention:   hand hygiene promoted   personal protective equipment utilized  Taken 12/07/2022 0800 by Harrison Mons, RN  Infection Prevention:   equipment surfaces disinfected   hand hygiene promoted   personal protective equipment utilized  Intervention: Prevent VTE (Venous Thromboembolism)  Recent Flowsheet Documentation  Taken 12/07/2022 0900 by Harrison Mons, RN  VTE Prevention/Management:   ambulation promoted   anticoagulant therapy  Goal: Optimal Comfort and Wellbeing  Outcome: Progressing  Goal: Home and Community Transition Plan Established  Outcome: Progressing  Goal: Rounds/Family Conference  Outcome: Progressing     Problem: Self-Care Deficit  Goal: Improved Ability to Complete Activities of Daily Living  Outcome: Progressing     Problem: Skin Injury Risk Increased  Goal:  Skin Health and Integrity  Outcome: Progressing  Intervention: Optimize Skin Protection  Recent Flowsheet Documentation  Taken 12/07/2022 1400 by Harrison Mons, RN  Pressure Reduction Techniques: frequent weight shift encouraged  Pressure Reduction Devices: positioning supports utilized  Skin Protection: adhesive use limited  Taken 12/07/2022 1200 by Harrison Mons, RN  Pressure Reduction Techniques: frequent weight shift encouraged  Pressure Reduction Devices: positioning supports utilized  Skin Protection: adhesive use limited  Taken 12/07/2022 1000 by Harrison Mons, RN  Pressure Reduction Techniques:   frequent weight shift encouraged   weight shift assistance provided  Pressure Reduction Devices: positioning supports utilized  Skin Protection:   adhesive use limited   incontinence pads utilized  Taken 12/07/2022 0800 by Harrison Mons, RN  Pressure Reduction Techniques: frequent weight shift encouraged  Pressure Reduction Devices: positioning supports utilized  Skin Protection:   adhesive use limited   incontinence pads utilized     Problem: Pain Acute  Goal: Optimal Pain Control and Function  Outcome: Progressing     Problem: Fall Injury Risk  Goal: Absence of Fall and Fall-Related Injury  Outcome: Progressing  Intervention: Promote Scientist, clinical (histocompatibility and immunogenetics) Documentation  Taken 12/07/2022 1400 by Harrison Mons, RN  Safety Interventions:   aspiration precautions   assistive device   fall reduction program maintained   family at bedside   lighting adjusted for tasks/safety   low bed   mobility aid   nonskid shoes/slippers when out of bed  Taken 12/07/2022 1200 by Harrison Mons, RN  Safety Interventions:   aspiration precautions   assistive device   family at bedside   fall reduction program maintained   lighting adjusted for tasks/safety   mobility aid   low bed   nonskid shoes/slippers when out of bed  Taken 12/07/2022 1000 by Harrison Mons, RN  Safety Interventions:   aspiration precautions   fall reduction program maintained   family at bedside   lighting adjusted for tasks/safety   low bed   nonskid shoes/slippers when out of bed  Taken 12/07/2022 0800 by Harrison Mons, RN  Safety Interventions:   aspiration precautions   fall reduction program maintained   family at bedside

## 2022-12-08 LAB — CBC
HEMATOCRIT: 32.8 % — ABNORMAL LOW (ref 34.0–44.0)
HEMOGLOBIN: 11.1 g/dL — ABNORMAL LOW (ref 11.3–14.9)
MEAN CORPUSCULAR HEMOGLOBIN CONC: 34 g/dL (ref 32.0–36.0)
MEAN CORPUSCULAR HEMOGLOBIN: 27.8 pg (ref 25.9–32.4)
MEAN CORPUSCULAR VOLUME: 81.7 fL (ref 77.6–95.7)
MEAN PLATELET VOLUME: 9.8 fL (ref 6.8–10.7)
PLATELET COUNT: 241 10*9/L (ref 150–450)
RED BLOOD CELL COUNT: 4.01 10*12/L (ref 3.95–5.13)
RED CELL DISTRIBUTION WIDTH: 14.9 % (ref 12.2–15.2)
WBC ADJUSTED: 8.2 10*9/L (ref 3.6–11.2)

## 2022-12-08 LAB — COMPREHENSIVE METABOLIC PANEL
ALBUMIN: 3.3 g/dL — ABNORMAL LOW (ref 3.4–5.0)
ALKALINE PHOSPHATASE: 73 U/L (ref 46–116)
ALT (SGPT): <7 U/L — ABNORMAL LOW (ref 10–49)
ANION GAP: 7 mmol/L (ref 5–14)
AST (SGOT): 18 U/L (ref <=34)
BILIRUBIN TOTAL: 0.2 mg/dL — ABNORMAL LOW (ref 0.3–1.2)
BLOOD UREA NITROGEN: 8 mg/dL — ABNORMAL LOW (ref 9–23)
BUN / CREAT RATIO: 17
CALCIUM: 9.2 mg/dL (ref 8.7–10.4)
CHLORIDE: 109 mmol/L — ABNORMAL HIGH (ref 98–107)
CO2: 24.6 mmol/L (ref 20.0–31.0)
CREATININE: 0.46 mg/dL — ABNORMAL LOW
EGFR CKD-EPI (2021) FEMALE: >90 mL/min/{1.73_m2} (ref >=60)
GLUCOSE RANDOM: 94 mg/dL (ref 70–179)
POTASSIUM: 3.6 mmol/L (ref 3.4–4.8)
PROTEIN TOTAL: 6.5 g/dL (ref 5.7–8.2)
SODIUM: 141 mmol/L (ref 135–145)

## 2022-12-08 LAB — PHOSPHORUS: PHOSPHORUS: 3.9 mg/dL (ref 2.4–5.1)

## 2022-12-08 LAB — MAGNESIUM: MAGNESIUM: 2.2 mg/dL (ref 1.6–2.6)

## 2022-12-08 MED ORDER — MULTIVITAMIN WITH FOLIC ACID 400 MCG TABLET
ORAL_TABLET | Freq: Every day | ORAL | 0 refills | 30 days | Status: CP
Start: 2022-12-08 — End: 2023-01-07

## 2022-12-08 MED ORDER — OXYCODONE 15 MG TABLET
ORAL_TABLET | ORAL | 0 refills | 5 days | Status: CP | PRN
Start: 2022-12-08 — End: 2022-12-13
  Filled 2022-12-10: qty 30, 5d supply, fill #0

## 2022-12-08 MED ORDER — AMMONIUM LACTATE 12 % LOTION
Freq: Two times a day (BID) | TOPICAL | 0 refills | 30 days | Status: CP
Start: 2022-12-08 — End: 2023-01-07
  Filled 2022-12-10: qty 226, 30d supply, fill #0

## 2022-12-08 MED ORDER — BUPRENORPHINE 2 MG-NALOXONE 0.5 MG SUBLINGUAL FILM
ORAL_FILM | Freq: Three times a day (TID) | SUBLINGUAL | 0 refills | 6 days | Status: CP
Start: 2022-12-08 — End: 2022-12-13
  Filled 2022-12-10: qty 8, 5d supply, fill #0

## 2022-12-08 MED ORDER — DIAZEPAM 5 MG TABLET
ORAL_TABLET | Freq: Every evening | ORAL | 0 refills | 5 days | Status: CP
Start: 2022-12-08 — End: 2022-12-13
  Filled 2022-12-10: qty 5, 5d supply, fill #0

## 2022-12-08 MED ADMIN — ammonium lactate (LAC-HYDRIN) 12 % lotion 1 Application: 1 | TOPICAL | @ 02:00:00

## 2022-12-08 MED ADMIN — promethazine (PHENERGAN) 6.5 mg in sodium chloride (NS) 0.9 % 50 mL IVPB: 6.5 mg | INTRAVENOUS | @ 22:00:00

## 2022-12-08 MED ADMIN — HYDROmorphone (PF) (DILAUDID) injection 0.5 mg: .5 mg | INTRAVENOUS | @ 17:00:00 | Stop: 2022-12-10

## 2022-12-08 MED ADMIN — carboxymethylcellulose sodium (REFRESH CELLUVISC) 1 % ophthalmic gel 1 drop: 1 [drp] | OPHTHALMIC | @ 02:00:00

## 2022-12-08 MED ADMIN — ondansetron (ZOFRAN) injection 4 mg: 4 mg | INTRAVENOUS | @ 17:00:00

## 2022-12-08 MED ADMIN — diazePAM (VALIUM) tablet 2.5 mg: 2.5 mg | ORAL | @ 18:00:00

## 2022-12-08 MED ADMIN — ondansetron (ZOFRAN) injection 4 mg: 4 mg | INTRAVENOUS | @ 01:00:00

## 2022-12-08 MED ADMIN — naloxegol (MOVANTIK) 12.5 mg tablet 12.5 mg: 12.5 mg | ORAL | @ 13:00:00

## 2022-12-08 MED ADMIN — diazePAM (VALIUM) tablet 5 mg: 5 mg | ORAL | @ 02:00:00

## 2022-12-08 MED ADMIN — buprenorphine-naloxone (SUBOXONE) 2-0.5 mg SL film 1 mg of buprenorphine: .5 | SUBLINGUAL | @ 18:00:00

## 2022-12-08 MED ADMIN — carboxymethylcellulose sodium (REFRESH CELLUVISC) 1 % ophthalmic gel 1 drop: 1 [drp] | OPHTHALMIC | @ 13:00:00

## 2022-12-08 MED ADMIN — oxyCODONE (ROXICODONE) immediate release tablet 15 mg: 15 mg | ORAL | @ 23:00:00 | Stop: 2022-12-10

## 2022-12-08 MED ADMIN — buprenorphine-naloxone (SUBOXONE) 2-0.5 mg SL film 1 mg of buprenorphine: .5 | SUBLINGUAL | @ 13:00:00

## 2022-12-08 MED ADMIN — pantoprazole (Protonix) EC tablet 40 mg: 40 mg | ORAL | @ 02:00:00

## 2022-12-08 MED ADMIN — nirogacestat (OGSIVEO) tablet 150 mg: 150 mg | ORAL | @ 10:00:00 | Stop: 2022-12-26

## 2022-12-08 MED ADMIN — multivitamin with folic acid 400 mcg tablet 1 tablet: 1 | ORAL | @ 13:00:00

## 2022-12-08 MED ADMIN — ammonium lactate (LAC-HYDRIN) 12 % lotion 1 Application: 1 | TOPICAL | @ 13:00:00

## 2022-12-08 MED ADMIN — oxyCODONE (ROXICODONE) immediate release tablet 15 mg: 15 mg | ORAL | @ 14:00:00 | Stop: 2022-12-10

## 2022-12-08 MED ADMIN — famotidine (PEPCID) tablet 20 mg: 20 mg | ORAL | @ 02:00:00

## 2022-12-08 MED ADMIN — buprenorphine-naloxone (SUBOXONE) 2-0.5 mg SL film 1 mg of buprenorphine: .5 | SUBLINGUAL | @ 01:00:00

## 2022-12-08 MED ADMIN — HYDROmorphone (PF) (DILAUDID) injection 0.5 mg: .5 mg | INTRAVENOUS | @ 01:00:00 | Stop: 2022-12-07

## 2022-12-08 MED ADMIN — nirogacestat (OGSIVEO) tablet 150 mg: 150 mg | ORAL | @ 23:00:00 | Stop: 2022-12-26

## 2022-12-08 MED ADMIN — pantoprazole (Protonix) EC tablet 40 mg: 40 mg | ORAL | @ 13:00:00

## 2022-12-08 MED ADMIN — diazePAM (VALIUM) tablet 2.5 mg: 2.5 mg | ORAL | @ 13:00:00

## 2022-12-08 NOTE — Unmapped (Signed)
A & O x 4, continent of bowel and bladder, had 2 bowel movement today, complains of generalized pain, Oxycodone, dilaudid and lidocaine patches given, Zofran IV given for nausea, NG tube remains patent, patient will start bolus feeding in the evening. Fall and skin care precautions maintained, mother at bedside. Monitoring ongoing.    Problem: Rehabilitation (IRF) Plan of Care  Goal: Plan of Care Review  Outcome: Progressing  Goal: Patient-Specific Goal (Individualized)  Outcome: Progressing  Goal: Absence of New-Onset Illness or Injury  Outcome: Progressing  Intervention: Prevent Fall and Fall Injury  Recent Flowsheet Documentation  Taken 12/08/2022 1600 by Winona Legato, RN  Safety Interventions:   fall reduction program maintained   family at bedside   nonskid shoes/slippers when out of bed  Taken 12/08/2022 1400 by Winona Legato, RN  Safety Interventions:   fall reduction program maintained   family at bedside   nonskid shoes/slippers when out of bed  Taken 12/08/2022 1200 by Winona Legato, RN  Safety Interventions:   fall reduction program maintained   family at bedside   nonskid shoes/slippers when out of bed  Taken 12/08/2022 0800 by Winona Legato, RN  Safety Interventions:   fall reduction program maintained   family at bedside   low bed   nonskid shoes/slippers when out of bed  Goal: Optimal Comfort and Wellbeing  Outcome: Progressing  Goal: Home and Community Transition Plan Established  Outcome: Progressing  Goal: Rounds/Family Conference  Outcome: Progressing     Problem: Self-Care Deficit  Goal: Improved Ability to Complete Activities of Daily Living  Outcome: Progressing     Problem: Skin Injury Risk Increased  Goal: Skin Health and Integrity  Outcome: Progressing  Intervention: Optimize Skin Protection  Recent Flowsheet Documentation  Taken 12/08/2022 1600 by Winona Legato, RN  Pressure Reduction Techniques:   frequent weight shift encouraged   pressure points protected  Pressure Reduction Devices: chair cushion utilized  Taken 12/08/2022 1400 by Winona Legato, RN  Pressure Reduction Techniques:   frequent weight shift encouraged   pressure points protected  Pressure Reduction Devices: chair cushion utilized  Taken 12/08/2022 1200 by Winona Legato, RN  Pressure Reduction Techniques:   frequent weight shift encouraged   pressure points protected  Pressure Reduction Devices: chair cushion utilized  Taken 12/08/2022 0800 by Winona Legato, RN  Pressure Reduction Techniques:   frequent weight shift encouraged   heels elevated off bed   pressure points protected     Problem: Pain Acute  Goal: Optimal Pain Control and Function  Outcome: Progressing     Problem: Fall Injury Risk  Goal: Absence of Fall and Fall-Related Injury  Outcome: Progressing  Intervention: Promote Injury-Free Environment  Recent Flowsheet Documentation  Taken 12/08/2022 1600 by Winona Legato, RN  Safety Interventions:   fall reduction program maintained   family at bedside   nonskid shoes/slippers when out of bed  Taken 12/08/2022 1400 by Winona Legato, RN  Safety Interventions:   fall reduction program maintained   family at bedside   nonskid shoes/slippers when out of bed  Taken 12/08/2022 1200 by Winona Legato, RN  Safety Interventions:   fall reduction program maintained   family at bedside   nonskid shoes/slippers when out of bed  Taken 12/08/2022 0800 by Winona Legato, RN  Safety Interventions:   fall reduction program maintained   family at bedside   low bed   nonskid shoes/slippers when out of bed  Problem: Nausea and Vomiting  Goal: Nausea and Vomiting Relief  Outcome: Progressing     Problem: Oral Intake Inadequate  Goal: Improved Oral Intake  Outcome: Progressing

## 2022-12-08 NOTE — Unmapped (Addendum)
Patient is alert, oriented, not in distress; able to take pills whole with apple sauce; Pt nauseous, tube feeds not immediately restarted; PRN Zofran and Dilaudid IV given once; Pt concerned about changing her PIV requesting for ultrasound guided IV insertion, rounding nurse consulted and she suggested for a CVAD placement because of pt's limited vasculature; patient informed and decided she would speak to her primary day team; pt no longer nauseous, tube feeding started but found constantly alarming every 5-20mins; noted downstream flow error, feeding set and pump changed twice, flushes easily without resistance but continued to alarm once infusion is started; pt also verbalized new onset of cough when tube feeding was started and requested if tube feeds can be held for tonight; provider informed and agreed with plan. Patient able to stand and transfer to wheelchair for toileting needs; continent in urine and bowel; noted diluted reddish brown streak in bowel movement, no other complains, continued to monitor and no further episode noted. No other acute events overnight; Fall, Aspiration and Delirium precautions observed and maintained; Provided call bell within reach;    Problem: Rehabilitation (IRF) Plan of Care  Goal: Plan of Care Review  Outcome: Progressing  Goal: Patient-Specific Goal (Individualized)  Outcome: Progressing  Goal: Absence of New-Onset Illness or Injury  Outcome: Progressing  Intervention: Prevent Fall and Fall Injury  Recent Flowsheet Documentation  Taken 12/08/2022 0200 by Rickard Patience, RN  Safety Interventions:   fall reduction program maintained   low bed  Taken 12/08/2022 0000 by Rickard Patience, RN  Safety Interventions:   fall reduction program maintained   low bed  Taken 12/07/2022 2200 by Rickard Patience, RN  Safety Interventions:   fall reduction program maintained   low bed  Taken 12/07/2022 2000 by Rickard Patience, RN  Safety Interventions:   fall reduction program maintained   low bed  Intervention: Prevent Infection  Recent Flowsheet Documentation  Taken 12/08/2022 0200 by Rickard Patience, RN  Infection Prevention:   hand hygiene promoted   single patient room provided  Taken 12/08/2022 0000 by Rickard Patience, RN  Infection Prevention:   hand hygiene promoted   single patient room provided  Taken 12/07/2022 2200 by Rickard Patience, RN  Infection Prevention:   hand hygiene promoted   single patient room provided  Taken 12/07/2022 2000 by Rickard Patience, RN  Infection Prevention:   hand hygiene promoted   single patient room provided  Goal: Optimal Comfort and Wellbeing  Outcome: Progressing  Goal: Home and Community Transition Plan Established  Outcome: Progressing  Goal: Rounds/Family Conference  Outcome: Progressing     Problem: Self-Care Deficit  Goal: Improved Ability to Complete Activities of Daily Living  Outcome: Progressing     Problem: Skin Injury Risk Increased  Goal: Skin Health and Integrity  Outcome: Progressing     Problem: Pain Acute  Goal: Optimal Pain Control and Function  Outcome: Progressing     Problem: Fall Injury Risk  Goal: Absence of Fall and Fall-Related Injury  Outcome: Progressing  Intervention: Promote Injury-Free Environment  Recent Flowsheet Documentation  Taken 12/08/2022 0200 by Rickard Patience, RN  Safety Interventions:   fall reduction program maintained   low bed  Taken 12/08/2022 0000 by Rickard Patience, RN  Safety Interventions:   fall reduction program maintained   low bed  Taken 12/07/2022 2200 by Rickard Patience, RN  Safety Interventions:   fall reduction program maintained   low bed  Taken 12/07/2022 2000  by Rickard Patience, RN  Safety Interventions:   fall reduction program maintained   low bed     Problem: Nausea and Vomiting  Goal: Nausea and Vomiting Relief  Outcome: Progressing     Problem: Oral Intake Inadequate  Goal: Improved Oral Intake  Outcome: Progressing

## 2022-12-08 NOTE — Unmapped (Signed)
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 Admitted to [facility] on 09/25/2022 for ileus, inability to tolerate PO now on supplemental tube feed, and diffuse pain due to muscle spasms of uncertain etiology.      Rehab Impairment Group Code [facility]): (Debility) 16 Debility (Non-Cardiac/Non-Pulmonary)  Etiology: Diffuse pain due to muscle spasms of uncertain etiology and cutaneous desmoid tumors    PLAN:     This patient is admitted to the Physical Medicine and Rehabilitation - Inpatient - C service from 8am-5pm on weekdays for questions regarding this patient. After hours, weekends, and holidays please contact the 1st Call resident pager     REHAB:   - PT and OT to maximize functional status with mobility and ADLs as well as prevention of joint contracture.   - Neuropsych for higher level cognitive evaluation and coping.  - RT for community re-integration, education, and leisure support services.  - Nutrition consult for diet information/teaching.   - To be discussed in weekly Interdisciplinary Team Conference.     Increased Tonicity  Dystonia   Started post-procedurally per patient ~09/25/22. Occurs intermittently throughout the day with a heightened occurrence during mobilization. At rest patient bilateral ankles in plantarflexion with worsening heel cord tightening. Has tonicity most prominent to HF and KE that is not velocity dependent. Has not noted significant improvement with increase of baclofen to 15 mg TID in setting of infection as below. Neurology previously evaluated patient seemingly ruling out an acute dystonic reaction, cord compression, central process, nutritional deficiency, stiff person syndrome and [name] after normal EMG on 8/22. Continues to be well controlled with valium, small improvement with baclofen PRN.  - Continue Baclofen 10 mg TID prn  - Continued Valium 2.5/2.5/5 mg TID and 2.5 mg nightly PRN  - PRAFO boots in bed     Headache  Currently managed with tylenol. She does have some posterior neck muscle tightness. Notes ketamine is helpful for headache pains. Describes photophobia and phonophobia with events. She does have loss of cervical lordosis on most recent MRI.   - Consider starting propranolol 5 mg BID for headache prophylaxis.   - PRN tylenol +/- prn fiorcet   - Lidocaine cream PRN to area of pain for cervicogenic headaches     Pain  Pain management consulted with recommendations as below. Patient will need to follow up with outpatient chronic pain provider ([name]). Gabapentin discontinued 9/17 and will not be continued upon discharge. Removed central line 9/23 and placed PIV. Continued on IV pain medication through PIV. Discussed discharge plan with outpatient pain provider and started IV pain medication wean on 9/28.   - Tylenol 650 mg q6h PRN  - Dilaudid 0.5mg  IV daily PRN  - Oxyocodone 10-15 mg q4h PRN  - Suboxone 2-0.5 mg TID  - Voltaren gel QID, lidocaine patches  - Continue patellar taping with therapy or obtain patellar brace to help with pain with ambulation  - Continue Movantik 12.5mg  daily; discontinue upon discharge from [facility]    Anasarca  Patient noted increased lower extremity, facial, and abdominal swelling prior to admission to AIR. Reported that pitting edema was present when taking Lyrica. Gabapentin discontinued 9/17. No current medications with significant side effects of swelling. Requested IV Lasix as this was helpful in  the past. Noted that some edema could be related to gabapentin, will plan to monitor and discontinue as indicated. On exam, patient with mild edema in bilateral feet, not appreciated elsewhere on exam. Noted improvement in edema with IV Lasix. Will continue to monitor daily and dose as needed. Oral Bumex trial 9/18 modestly helpful.    - Bumex 0.5mg  x1 9/18. Lasix IV 10mg  x1 9/19. Lasix IV 10mg  x1 9/20. Lasix IV 10mg  x1 9/27.  - Daily standing afternoon weight  - Increase protein intake per dietician     Multifactorial Ileus   Nausea (chronic)  [employer] Emesis 2/2 NGT placement (resolved)  Contributors with history of narcotics, poor PO intake. Concern for malnutrition. Improvements to abdominal pain since weaning opioids. TPN has been discontinued with patient now tolerating tube feeds. Patient with chronic nausea that was previously managed with Zofran, though transitioned to phenergan due to prolonged Qtc. Upon admission to AIR, continued nightly tube feeds with Corpak NG tube in place, and PO intake as tolerated during the day. Will continue to decrease tube feeds with goal of discharge from AIR on full regular diet. If unable to tolerate full PO diet, will consider discharge home with corpak. Patient noted weight gain upon arrival to AIR (felt likely fluid retention) as weight was increased in a short period of time.   - Dietician following; appreciate care and recommendations  - Continue nightly tube feeds + bolus feeds (started 9/30)  - Continue PO regular diet + protein supplementation; anticipate continuation of NG tube on discharge  - Calorie count  - Daily standing weights   - Continue Protonix 40mg  BID  - Continue IV phenergan 6.5mg  Q6 PRN; Scopolamine patch ordered 9/19 in hopes of transitioning off IV  [ ]  restart home cefdinir and doxycycline upon discharge from AIR for SIBP     Syncope  Multifactorial. Primary team ddx includes: vasovagal vs adverse effect of medications vs deconditioning vs iron deficiency. Last echo 04/2022. Stable upon admission to AIR.   - CTM     IDA  Baseline hemoglobin ~9-10. Stable upon admission to AIR at 9.8. Will continue to monitor while admitted.   - M/Th CBC  - Has allergy to formula iron      [name] - Desmoid Tumors  Follows [facility] (Dr. [name]) is patient's outpatient oncologist. Per oncology, most common side effects of the chemotherapy are diarrhea, nausea, headache, and rash. Patient has not had increase in baseline nausea while taking the medication and increased dose as planned on 9/17. Will continue to monitor and discuss with outpatient provider as indicated. Noted some dry mouth and skin sensitivity on 9/24, patient reported similar symptoms with chemotherapy in the past. Started PRN ammonium lactate lotion, biotene spray, and mucositis mixture as needed. Noted some red in stool starting 9/24. No other signs/symptoms of bleeding. Hemoglobin remained stable.   - Nirogacestat 150mg  BID; increased 9/17  - M/Th CMP; monitor LFT  - Ammonium lactate lotion PRN 9/24  - Biotene spray PRN 9/24  - mucositis swish/spit PRN  [ ]  follow up with oncology; Dr. [name] 12/16/22     Enterobacteriaceae group bacteremia (Resolved) - C albicans fungemia (Resolved)  Likely due to RIJ tunneled line placed 01/2022 now removed on 8/14.  8/13, 8/17 repeat blood cultures NGTD.  No evidence of vitreitis, retinitis 8/15 per Ophtho. TTE 8/15 w possible vegetations on MV and TV or indwelling catheter; however, TEE 8/20 w/o vegetations. ID consulted during acute admission and now s/p  treatment with cephalosporin and fluconazole on 8/31. Patient noted to have difficult IV access and kept R IJ access upon admission to AIR. Placed peripheral IV on 9/23 and removed central line.      Generalized anxiety disorder, depression, h/o panic attacks  Psychiatry consulted during acute hospitalization with recommendations as below.   - Consider decreasing mirtazapine 15 mg nightly to 7.5 mg nightly due to somnolence  - Valium as above, if needed could give 2.5mg  PRN for acute anxiety  - Neuropsychology consulted; appreciate care    DME  - Bedside Commode Necessity: Patient requires a bedside commode as they are confined to a single room and/or level of the home w/ no access to a bathroom on that level.   - Wheelchair Necessity: Patient has mobility limitations that interfere with performing ADLs and MADLs (a custom wheelchair will improve this), mobility limitations that cannot be corrected with a cane or walker. Patient has expressed a willingness to use a wheelchair and has adequate space in their living environment for a wheelchair. Patient has a caregiver who is willing and available to assist patient w/ wheelchair transport.  Dan Humphreys Necessity: Patient requires a rolling walker due to mobility limitation that significantly impairs their ability to participate in one or more mobility related activities of daily living in the home. A mobility limitation provides the beneficiary from accomplishing MRADL entirely. The beneficiary is able to safely use the walker.      Daily Checklist  - Diet: Regular diet + nighttime tube feeds + bolus tube feeds  - DVT PPX: SQ enoxaparin; patient refused   - GI PPX: Protonix  - Access: PIV. S/p R IJ non tunneled CVC.     DISPO: Patient to be discussed at weekly interdisciplinary team conference.   - EDD: 12/10/2022  - Follow-up: PCP, Chronic Pain, Oncology (Dr. [name]), Psych    SUBJECTIVE and PROGRESS WITH FUNCTIONAL ACTIVITIES:     Interval events:  NAEON. Patient seen this morning and noted that her PIV is in a location that makes navigating her wheelchair difficult at times. Yesterday rounding nurse consulted to see if different PIV needed to be placed. Chart reviewed and ultimately determined that current PIV was working and based off previous difficulty with placement, did not pursue another peripheral line as patient receiving PRN medications through IV that do have oral alternatives.     Patient weaned completely off of supplemental oxygen.     OBJECTIVE:     Vital signs (last 24 hours):  Temp:  [36.6 ??C (97.9 ??F)-36.7 ??C (98.1 ??F)] 36.7 ??C (98.1 ??F)  Heart Rate:  [85-102] 102  Resp:  [18-20] 18  BP: (102-115)/(55-66) 102/55  MAP (mmHg): [68-79] 68  SpO2:  [92 %-96 %] 96 %    Intake/Output (last 3 shifts):  I/O last 3 completed shifts:  In: 290 [P.O.:30; NG/GT:260]  Out: 250 [Urine:250]    Physical Exam:   GEN: Sitting comfortably in wheelchair in no acute distress  EYES: sclera anicteric, conjunctiva clear   HEENT: Corpak in place L nare   CV: Warm and well perfused, trace edema in bilateral feet is stabale  RESP:  NWB on RA   ABD: Not distended  SKIN: Central line site without erythema, bandage in place clean/dry/intact  MSK: Not visualized today, previously sacrum with some soft tissue swelling near lumbosacral junction   NEURO: Alert, speech fluent  PSYCH: Mood euthymic, affect appropriate    Medications:  Scheduled    ammonium lactate (LAC-HYDRIN) 12 %  lotion 1 Application BID    buprenorphine-naloxone (SUBOXONE) 2-0.5 mg SL film 1 mg of buprenorphine TID    carboxymethylcellulose sodium (REFRESH CELLUVISC) 1 % ophthalmic gel 1 drop QID    diazePAM (VALIUM) tablet 2.5 mg Daily    And    diazePAM (VALIUM) tablet 2.5 mg Daily    diazePAM (VALIUM) tablet 5 mg Nightly    diclofenac sodium (VOLTAREN) 1 % gel 2 g QID    [Provider Hold] enoxaparin (LOVENOX) syringe 40 mg Q24H SCH    famotidine (PEPCID) tablet 20 mg Nightly    flu vaccine TS 2024-25(54mos up)(PF)(FLULAVAL, FLUARIX, FLUZONE) During hospitalization    lidocaine (ASPERCREME) 4 % 2 patch Daily    mirtazapine (REMERON) tablet 15 mg Nightly    multivitamin with folic acid 400 mcg tablet 1 tablet Daily    naloxegol (MOVANTIK) 12.5 mg tablet 12.5 mg Daily    nirogacestat (OGSIVEO) tablet 150 mg BID    pantoprazole (Protonix) EC tablet 40 mg BID    scopolamine (TRANSDERM-SCOP) 1 mg over 3 days topical patch 1 mg Q72H     PRN acetaminophen, 650 mg, Q6H PRN  alum-mag-simeth, 60 mL, Q6H PRN  baclofen, 10 mg, TID PRN  bisacodyl, 10 mg, BID PRN  butalbital-acetaminophen-caffeine, 1 tablet, BID PRN  carboxymethylcellulose sodium, 2 drop, QID PRN  Chemo Clarification Order, , Continuous PRN  Chemo Clarification Order, , Continuous PRN  diazePAM, 2.5 mg, Nightly PRN  diphenhydrAMINE, 25 mg, Q6H PRN  guaiFENesin, 200 mg, Q4H PRN  hydrocortisone, , BID PRN  HYDROmorphone, 0.5 mg, Daily PRN  IP okay to treat, , Continuous PRN  lidocaine-diphenhydrAMINE-aluminum-magnesium, 10 mL, QID PRN  ondansetron, 4 mg, Q12H PRN  oxyCODONE, 10 mg, Q4H PRN   Or  oxyCODONE, 15 mg, Q4H PRN  phenol, 2 spray, Q2H PRN  polyethylene glycol, 17 g, BID PRN  promethazine, 6.5 mg, Q6H PRN  pseudoephedrine, 30 mg, Daily PRN  saliva stimulant comb. no.3, 1 spray, Q2H PRN  senna, 1 tablet, BID PRN  zinc oxide-cod liver oil, , Daily PRN        Labs/Studies: Reviewed.    Radiology Results: Reviewed    Quality Indicators      Hearing, Speech, and Vision  Ability to Hear: Adequate  Ability to See in Adequate Light: Adequate  Expression of Ideas and Wants: Without difficulty  Understanding Verbal and Non-Verbal Content: Understands    Cognitive Pattern Assessment  Cognitive Pattern Assessment Used: BIMS  Brief Interview for Mental Status (BIMS)  Repetition of Three Words (First Attempt): 3  Temporal Orientation: Year: Correct  Temporal Orientation: Month: Accurate within 5 days  Temporal Orientation: Day: Correct  Recall: Sock: Yes, no cue required  Recall: Blue: Yes, no cue required  Recall: Bed: Yes, no cue required  BIMS Summary Score: 15       Nutritional Approaches  Nutritional Approach: Feeding tube    ADLs  Admission Current   Eating Assistance Needed: Set-up / clean-up    CARE Score - 5 Assistance Needed: Independent    CARE Score - 6   Oral Hygiene  Assistance Needed: Set-up / clean-up    CARE Score - 5  Assistance Needed: Independent    CARE Score - 6   Bladder Continence       Bowel Continence       Toileting Hygiene Assistance Needed: Physical assistance Total assistance  CARE Score - 1 Assistance Needed: Incidental touching, Supervision    CARE Score - 4    Toilet  Transfer Assistance Needed: Physical assistance Total assistance  CARE Score - 88 Assistance Needed: Supervision    CARE Score - 4   Shower/Bathe Self Assistance Needed: Physical assistance 26%-50%  CARE Score - 88  Assistance Needed: Physical assistance    CARE Score - 3   Upper Body Dressing Assistance Needed: Physical assistance 26%-50%  CARE Score - 3  Assistance Needed: Set-up / clean-up    CARE Score - 5   Lower Body Dressing Assistance Needed: Physical assistance Total assistance  CARE Score - 1  Assistance Needed: Incidental touching, Supervision, Adaptive equipment    CARE Score - 4   On/Off Footwear Assistance Needed: Physical assistance Total assistance  CARE Score - 1  Assistance Needed: Independent, Adaptive equipment    CARE Score - 6         Transfers Admission Current   Bed to Chair Assistance Needed: Physical assistance Total assistance  CARE Score - 1  Assistance Needed: Supervision    CARE Score - 4     Lying to Sitting Assistance Needed: Physical assistance Total assistance  CARE Score - 1  Assistance Needed: Physical assistance    CARE Score - 3   Roll Left/Right Assistance Needed: Physical assistance 76% or more  CARE Score - 2  Assistance Needed: Adaptive equipment, Incidental touching    CARE Score - 4   Sit to Lying Assistance Needed: Physical assistance Total assistance  CARE Score - 1  Assistance Needed: Physical assistance    CARE Score - 3   Sit to Stand Assistance Needed: Physical assistance Total assistance  CARE Score - 1  Assistance Needed: Incidental touching, Adaptive equipment    CARE Score - 4         Mobility Admission Current   Walk 10 Feet Assistance Needed: Physical assistance Total assistance  CARE Score - 1 Assistance Needed: Incidental touching, Adaptive equipment    CARE Score - 4     Walk 50 Feet 2 Turns Assistance Needed: Physical assistance 25% or less  CARE Score - 88 Assistance Needed: Incidental touching, Adaptive equipment    CARE Score - 4   Walk 150 Feet Assistance Needed: Physical assistance 25% or less  CARE Score - 88  Assistance Needed: Incidental touching, Adaptive equipment    CARE Score - 4   Walk 10 Feet Uneven Assistance Needed: Physical assistance 25% or less  CARE Score - 88  Assistance Needed: Incidental touching, Adaptive equipment    CARE Score - 4   1 Step (Curb) Assistance Needed: Incidental touching, Adaptive equipment    CARE Score - 88  Assistance Needed: Incidental touching, Adaptive equipment    CARE Score - 4   4 Steps Assistance Needed: Incidental touching, Adaptive equipment    CARE Score - 88  Assistance Needed: Incidental touching, Adaptive equipment    CARE Score - 4   12 Steps      CARE Score - 88       CARE Score - 88   Picking Up Object      CARE Score - 88       CARE Score - 88   Wheelchair/Scooter Use        Wheel 50 Feet 2 Turns Assistance Needed: Physical assistance Total assistance    CARE Score - Wheel 50 Feet with Two Turns: 1    Type of Wheelchair/Scooter: Manual Assistance Needed: Independent      CARE Score - Wheel 50 Feet with Two Turns: 6  Type of Wheelchair/Scooter: Manual   Wheel 150 Feet Assistance Needed: Physical assistance Total assistance    CARE Score - Wheel 150 Feet: 1    Type of Wheelchair/Scooter: Manual  Assistance Needed: Independent      CARE Score - Wheel 150 Feet: 6    Type of Wheelchair/Scooter: Manual

## 2022-12-09 MED ORDER — SCOPOLAMINE 1 MG OVER 3 DAYS TRANSDERMAL PATCH
MEDICATED_PATCH | TRANSDERMAL | 0 refills | 30 days | Status: CP
Start: 2022-12-09 — End: 2023-01-08
  Filled 2022-12-10: qty 10, 30d supply, fill #0

## 2022-12-09 MED ADMIN — multivitamin with folic acid 400 mcg tablet 1 tablet: 1 | ORAL | @ 12:00:00

## 2022-12-09 MED ADMIN — diazePAM (VALIUM) tablet 5 mg: 5 mg | ORAL | @ 01:00:00

## 2022-12-09 MED ADMIN — HYDROmorphone (PF) (DILAUDID) injection 0.5 mg: .5 mg | INTRAVENOUS | @ 19:00:00 | Stop: 2022-12-10

## 2022-12-09 MED ADMIN — carboxymethylcellulose sodium (REFRESH CELLUVISC) 1 % ophthalmic gel 1 drop: 1 [drp] | OPHTHALMIC | @ 17:00:00

## 2022-12-09 MED ADMIN — ammonium lactate (LAC-HYDRIN) 12 % lotion 1 Application: 1 | TOPICAL | @ 01:00:00

## 2022-12-09 MED ADMIN — pantoprazole (Protonix) EC tablet 40 mg: 40 mg | ORAL | @ 12:00:00

## 2022-12-09 MED ADMIN — buprenorphine-naloxone (SUBOXONE) 2-0.5 mg SL film 1 mg of buprenorphine: .5 | SUBLINGUAL | @ 19:00:00

## 2022-12-09 MED ADMIN — famotidine (PEPCID) tablet 20 mg: 20 mg | ORAL | @ 01:00:00

## 2022-12-09 MED ADMIN — nirogacestat (OGSIVEO) tablet 150 mg: 150 mg | ORAL | @ 11:00:00 | Stop: 2022-12-26

## 2022-12-09 MED ADMIN — nirogacestat (OGSIVEO) tablet 150 mg: 150 mg | ORAL | @ 23:00:00 | Stop: 2022-12-26

## 2022-12-09 MED ADMIN — oxyCODONE (ROXICODONE) immediate release tablet 15 mg: 15 mg | ORAL | @ 17:00:00 | Stop: 2022-12-10

## 2022-12-09 MED ADMIN — oxyCODONE (ROXICODONE) immediate release tablet 15 mg: 15 mg | ORAL | @ 23:00:00 | Stop: 2022-12-10

## 2022-12-09 MED ADMIN — promethazine (PHENERGAN) 6.5 mg in sodium chloride (NS) 0.9 % 50 mL IVPB: 6.5 mg | INTRAVENOUS | @ 12:00:00

## 2022-12-09 MED ADMIN — oxyCODONE (ROXICODONE) immediate release tablet 15 mg: 15 mg | ORAL | @ 12:00:00 | Stop: 2022-12-10

## 2022-12-09 MED ADMIN — pantoprazole (Protonix) EC tablet 40 mg: 40 mg | ORAL | @ 01:00:00

## 2022-12-09 MED ADMIN — naloxegol (MOVANTIK) 12.5 mg tablet 12.5 mg: 12.5 mg | ORAL | @ 13:00:00

## 2022-12-09 MED ADMIN — buprenorphine-naloxone (SUBOXONE) 2-0.5 mg SL film 1 mg of buprenorphine: .5 | SUBLINGUAL | @ 01:00:00

## 2022-12-09 MED ADMIN — scopolamine (TRANSDERM-SCOP) 1 mg over 3 days topical patch 1 mg: 1 | TOPICAL | @ 17:00:00

## 2022-12-09 MED ADMIN — buprenorphine-naloxone (SUBOXONE) 2-0.5 mg SL film 1 mg of buprenorphine: .5 | SUBLINGUAL | @ 12:00:00

## 2022-12-09 MED ADMIN — promethazine (PHENERGAN) 6.5 mg in sodium chloride (NS) 0.9 % 50 mL IVPB: 6.5 mg | INTRAVENOUS | @ 04:00:00

## 2022-12-09 MED ADMIN — diazePAM (VALIUM) tablet 2.5 mg: 2.5 mg | ORAL | @ 19:00:00

## 2022-12-09 MED ADMIN — diazePAM (VALIUM) tablet 2.5 mg: 2.5 mg | ORAL | @ 12:00:00

## 2022-12-09 MED ADMIN — oxyCODONE (ROXICODONE) immediate release tablet 15 mg: 15 mg | ORAL | @ 03:00:00 | Stop: 2022-12-10

## 2022-12-09 MED ADMIN — ondansetron (ZOFRAN) injection 4 mg: 4 mg | INTRAVENOUS | @ 19:00:00

## 2022-12-09 MED ADMIN — diclofenac sodium (VOLTAREN) 1 % gel 2 g: 2 g | TOPICAL | @ 17:00:00

## 2022-12-09 NOTE — Unmapped (Signed)
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 Admitted to [facility] on 09/25/2022 for ileus, inability to tolerate PO now on supplemental tube feed, and diffuse pain due to muscle spasms of uncertain etiology.      Rehab Impairment Group Code [facility]): (Debility) 16 Debility (Non-Cardiac/Non-Pulmonary)  Etiology: Diffuse pain due to muscle spasms of uncertain etiology and cutaneous desmoid tumors    PLAN:     This patient is admitted to the Physical Medicine and Rehabilitation - Inpatient - C service from 8am-5pm on weekdays for questions regarding this patient. After hours, weekends, and holidays please contact the 1st Call resident pager     REHAB:   - PT and OT to maximize functional status with mobility and ADLs as well as prevention of joint contracture.   - Neuropsych for higher level cognitive evaluation and coping.  - RT for community re-integration, education, and leisure support services.  - Nutrition consult for diet information/teaching.   - To be discussed in weekly Interdisciplinary Team Conference.     Increased Tonicity  Dystonia   Started post-procedurally per patient ~09/25/22. Occurs intermittently throughout the day with a heightened occurrence during mobilization. At rest patient bilateral ankles in plantarflexion with worsening heel cord tightening. Has tonicity most prominent to HF and KE that is not velocity dependent. Has not noted significant improvement with increase of baclofen to 15 mg TID in setting of infection as below. Neurology previously evaluated patient seemingly ruling out an acute dystonic reaction, cord compression, central process, nutritional deficiency, stiff person syndrome and [name] after normal EMG on 8/22. Continues to be well controlled with valium, small improvement with baclofen PRN.  - Continue Baclofen 10 mg TID prn  - Continued Valium 2.5/2.5/5 mg TID and 2.5 mg nightly PRN  - PRAFO boots in bed     Headache  Currently managed with tylenol. She does have some posterior neck muscle tightness. Notes ketamine is helpful for headache pains. Describes photophobia and phonophobia with events. She does have loss of cervical lordosis on most recent MRI.   - Consider starting propranolol 5 mg BID for headache prophylaxis.   - PRN tylenol +/- prn fiorcet   - Lidocaine cream PRN to area of pain for cervicogenic headaches     Pain  Pain management consulted with recommendations as below. Patient will need to follow up with outpatient chronic pain provider ([name]). Gabapentin discontinued 9/17 and will not be continued upon discharge. Removed central line 9/23 and placed PIV. Continued on IV pain medication through PIV. Discussed discharge plan with outpatient pain provider and started IV pain medication wean on 9/28.   - Tylenol 650 mg q6h PRN  - Dilaudid 0.5mg  IV daily PRN  - Oxyocodone 10-15 mg q4h PRN  - Suboxone 2-0.5 mg TID  - Voltaren gel QID, lidocaine patches  - Continue patellar taping with therapy or obtain patellar brace to help with pain with ambulation  - Continue Movantik 12.5mg  daily; discontinue upon discharge from [facility]    Anasarca  Patient noted increased lower extremity, facial, and abdominal swelling prior to admission to [facility]. Reported that pitting edema was present when taking Lyrica. Gabapentin discontinued 9/17. No current medications with significant side effects of swelling. Requested IV Lasix as this was helpful in  the past. Noted that some edema could be related to gabapentin, will plan to monitor and discontinue as indicated. On exam, patient with mild edema in bilateral feet, not appreciated elsewhere on exam. Noted improvement in edema with IV Lasix. Will continue to monitor daily and dose as needed. Oral Bumex trial 9/18 modestly helpful.    - Bumex 0.5mg  x1 9/18. Lasix IV 10mg  x1 9/19. Lasix IV 10mg  x1 9/20. Lasix IV 10mg  x1 9/27.  - Daily standing afternoon weight  - Increase protein intake per dietician     Multifactorial Ileus   Nausea (chronic)  [employer] Emesis 2/2 NGT placement (resolved)  Contributors with history of narcotics, poor PO intake. Concern for malnutrition. Improvements to abdominal pain since weaning opioids. TPN has been discontinued with patient now tolerating tube feeds. Patient with chronic nausea that was previously managed with Zofran, though transitioned to phenergan due to prolonged Qtc. Upon admission to [facility], continued nightly tube feeds with Corpak NG tube in place, and PO intake as tolerated during the day. Will continue to decrease tube feeds with goal of discharge from [facility] on full regular diet. If unable to tolerate full PO diet, will consider discharge home with corpak. Patient noted weight gain upon arrival to [facility] (felt likely fluid retention) as weight was increased in a short period of time.   - Dietician following; appreciate care and recommendations  - Continue nightly tube feeds + bolus feeds (started 9/30)  - Continue PO regular diet + protein supplementation; anticipate continuation of NG tube on discharge  - Calorie count  - Daily standing weights   - Continue Protonix 40mg  BID  - Continue IV phenergan 6.5mg  Q6 PRN; Scopolamine patch ordered 9/19 in hopes of transitioning off IV  [ ]  restart home cefdinir and doxycycline upon discharge from [facility] for SIBP     Syncope  Multifactorial. Primary team ddx includes: vasovagal vs adverse effect of medications vs deconditioning vs iron deficiency. Last echo 04/2022. Stable upon admission to [facility].   - CTM     IDA  Baseline hemoglobin ~9-10. Stable upon admission to [facility] at 9.8. Will continue to monitor while admitted.   - M/Th CBC  - Has allergy to formula iron      [name] - Desmoid Tumors  Follows [facility] (Dr. [name]) is patient's outpatient oncologist. Per oncology, most common side effects of the chemotherapy are diarrhea, nausea, headache, and rash. Patient has not had increase in baseline nausea while taking the medication and increased dose as planned on 9/17. Will continue to monitor and discuss with outpatient provider as indicated. Noted some dry mouth and skin sensitivity on 9/24, patient reported similar symptoms with chemotherapy in the past. Started PRN ammonium lactate lotion, biotene spray, and mucositis mixture as needed. Noted some red in stool starting 9/24. No other signs/symptoms of bleeding. Hemoglobin remained stable.   - Nirogacestat 150mg  BID; increased 9/17  - M/Th CMP; monitor LFT  - Ammonium lactate lotion PRN 9/24  - Biotene spray PRN 9/24  - mucositis swish/spit PRN  [ ]  follow up with oncology; Dr. [name] 12/16/22     Enterobacteriaceae group bacteremia (Resolved) - C albicans fungemia (Resolved)  Likely due to RIJ tunneled line placed 01/2022 now removed on 8/14.  8/13, 8/17 repeat blood cultures NGTD.  No evidence of vitreitis, retinitis 8/15 per Ophtho. TTE 8/15 w possible vegetations on MV and TV or indwelling catheter; however, TEE 8/20 w/o vegetations. ID consulted during acute admission and now s/p  treatment with cephalosporin and fluconazole on 8/31. Patient noted to have difficult IV access and kept R IJ access upon admission to [facility]. Placed peripheral IV on 9/23 and removed central line.      Generalized anxiety disorder, depression, h/o panic attacks  Psychiatry consulted during acute hospitalization with recommendations as below.   - Consider decreasing mirtazapine 15 mg nightly to 7.5 mg nightly due to somnolence  - Valium as above, if needed could give 2.5mg  PRN for acute anxiety  - Neuropsychology consulted; appreciate care    DME  - Bedside Commode Necessity: Patient requires a bedside commode as they are confined to a single room and/or level of the home w/ no access to a bathroom on that level.   - Wheelchair Necessity: Patient has mobility limitations that interfere with performing ADLs and MADLs (a custom wheelchair will improve this), mobility limitations that cannot be corrected with a cane or walker. Patient has expressed a willingness to use a wheelchair and has adequate space in their living environment for a wheelchair. Patient has a caregiver who is willing and available to assist patient w/ wheelchair transport.  Dan Humphreys Necessity: Patient requires a rolling walker due to mobility limitation that significantly impairs their ability to participate in one or more mobility related activities of daily living in the home. A mobility limitation provides the beneficiary from accomplishing MRADL entirely. The beneficiary is able to safely use the walker.      Daily Checklist  - Diet: Regular diet + nighttime tube feeds + bolus tube feeds  - DVT PPX: SQ enoxaparin; patient refused   - GI PPX: Protonix  - Access: PIV. S/p R IJ non tunneled CVC.     DISPO: Patient to be discussed at weekly interdisciplinary team conference.   - EDD: 12/10/2022  - Follow-up: PCP, Chronic Pain, Oncology (Dr. [name]), Psych    SUBJECTIVE and PROGRESS WITH FUNCTIONAL ACTIVITIES:     Interval events:  NAEON. VSS. Patient working with [facility] this am and painting. She states that she has some anxiety with discharging tomorrow but is happy that she is finally able to discharge from a hospital facility. She states she felt like she had high expectations of things she would like to accomplish prior to discharge but is comfortable and encouraged she will improve in the outpatient setting.     OBJECTIVE:     Vital signs (last 24 hours):  Temp:  [36.9 ??C (98.4 ??F)] 36.9 ??C (98.4 ??F)  Heart Rate:  [91-98] 98  Resp:  [16-18] 16  BP: (99-105)/(56-83) 99/83  MAP (mmHg):  [70-90] 90  SpO2:  [96 %-97 %] 97 %    Intake/Output (last 3 shifts):  I/O last 3 completed shifts:  In: 936 [P.O.:80; NG/GT:856]  Out: 1250 [Urine:1250]    Physical Exam:   GEN: Sitting in WC comfortably and painting, pleasant   EYES: sclera anicteric, conjunctiva clear   HEENT: Corpak in place L nare   CV: Warm and well perfused, trace edema in bilateral feet is stabale  RESP:  NWB on RA   ABD: Not distended  SKIN: Central line site without erythema, bandage in place clean/dry/intact  MSK: Not visualized today, previously sacrum with some soft tissue swelling near lumbosacral junction   NEURO: Alert, speech fluent  PSYCH: Mood euthymic, affect appropriate    Medications:  Scheduled   ??? ammonium lactate (LAC-HYDRIN) 12 % lotion 1 Application BID   ??? buprenorphine-naloxone (SUBOXONE) 2-0.5 mg SL film 1 mg of  buprenorphine TID   ??? carboxymethylcellulose sodium (REFRESH CELLUVISC) 1 % ophthalmic gel 1 drop QID   ??? diazePAM (VALIUM) tablet 2.5 mg Daily    And   ??? diazePAM (VALIUM) tablet 2.5 mg Daily   ??? diazePAM (VALIUM) tablet 5 mg Nightly   ??? diclofenac sodium (VOLTAREN) 1 % gel 2 g QID   ??? [Provider Hold] enoxaparin (LOVENOX) syringe 40 mg Q24H SCH   ??? famotidine (PEPCID) tablet 20 mg Nightly   ??? flu vaccine TS 2024-25(38mos up)(PF)(FLULAVAL, FLUARIX, FLUZONE) During hospitalization   ??? lidocaine (ASPERCREME) 4 % 2 patch Daily   ??? mirtazapine (REMERON) tablet 15 mg Nightly   ??? multivitamin with folic acid 400 mcg tablet 1 tablet Daily   ??? naloxegol (MOVANTIK) 12.5 mg tablet 12.5 mg Daily   ??? nirogacestat (OGSIVEO) tablet 150 mg BID   ??? pantoprazole (Protonix) EC tablet 40 mg BID   ??? scopolamine (TRANSDERM-SCOP) 1 mg over 3 days topical patch 1 mg Q72H     PRN acetaminophen, 650 mg, Q6H PRN  alum-mag-simeth, 60 mL, Q6H PRN  baclofen, 10 mg, TID PRN  bisacodyl, 10 mg, BID PRN  butalbital-acetaminophen-caffeine, 1 tablet, BID PRN  carboxymethylcellulose sodium, 2 drop, QID PRN  Chemo Clarification Order, , Continuous PRN  Chemo Clarification Order, , Continuous PRN  diazePAM, 2.5 mg, Nightly PRN  diphenhydrAMINE, 25 mg, Q6H PRN  guaiFENesin, 200 mg, Q4H PRN  hydrocortisone, , BID PRN  HYDROmorphone, 0.5 mg, Daily PRN  IP okay to treat, , Continuous PRN  lidocaine-diphenhydrAMINE-aluminum-magnesium, 10 mL, QID PRN  ondansetron, 4 mg, Q12H PRN  oxyCODONE, 10 mg, Q4H PRN   Or  oxyCODONE, 15 mg, Q4H PRN  phenol, 2 spray, Q2H PRN  polyethylene glycol, 17 g, BID PRN  promethazine, 6.5 mg, Q6H PRN  pseudoephedrine, 30 mg, Daily PRN  saliva stimulant comb. no.3, 1 spray, Q2H PRN  senna, 1 tablet, BID PRN  zinc oxide-cod liver oil, , Daily PRN        Labs/Studies: Reviewed.    Radiology Results: Reviewed    Quality Indicators      Hearing, Speech, and Vision  Ability to Hear: Adequate  Ability to See in Adequate Light: Adequate  Expression of Ideas and Wants: Without difficulty  Understanding Verbal and Non-Verbal Content: Understands    Cognitive Pattern Assessment  Cognitive Pattern Assessment Used: BIMS  Brief Interview for Mental Status (BIMS)  Repetition of Three Words (First Attempt): 3  Temporal Orientation: Year: Correct  Temporal Orientation: Month: Accurate within 5 days  Temporal Orientation: Day: Correct  Recall: Sock: Yes, no cue required  Recall: Blue: Yes, no cue required  Recall: Bed: Yes, no cue required  BIMS Summary Score: 15       Nutritional Approaches  Nutritional Approach: Feeding tube    ADLs  Admission Current   Eating Assistance Needed: Set-up / clean-up    CARE Score - 5 Assistance Needed: Independent    CARE Score - 6   Oral Hygiene  Assistance Needed: Set-up / clean-up    CARE Score - 5  Assistance Needed: Independent    CARE Score - 6   Bladder Continence       Bowel Continence       Toileting Hygiene Assistance Needed: Physical assistance Total assistance  CARE Score - 1 Assistance Needed: Incidental touching, Supervision    CARE Score - 4    Toilet Transfer Assistance Needed: Physical assistance Total assistance  CARE Score - 88 Assistance Needed: Supervision  CARE Score - 4   Shower/Bathe Self Assistance Needed: Physical assistance 26%-50%  CARE Score - 88  Assistance Needed: Physical assistance    CARE Score - 3   Upper Body Dressing Assistance Needed: Physical assistance 26%-50%  CARE Score - 3  Assistance Needed: Set-up / clean-up    CARE Score - 5   Lower Body Dressing Assistance Needed: Physical assistance Total assistance  CARE Score - 1  Assistance Needed: Incidental touching, Supervision, Adaptive equipment    CARE Score - 4   On/Off Footwear Assistance Needed: Physical assistance Total assistance  CARE Score - 1  Assistance Needed: Independent, Adaptive equipment    CARE Score - 6         Transfers Admission Current   Bed to Chair Assistance Needed: Physical assistance Total assistance  CARE Score - 1  Assistance Needed: Supervision    CARE Score - 4     Lying to Sitting Assistance Needed: Physical assistance Total assistance  CARE Score - 1  Assistance Needed: Physical assistance    CARE Score - 3   Roll Left/Right Assistance Needed: Physical assistance 76% or more  CARE Score - 2  Assistance Needed: Adaptive equipment, Incidental touching    CARE Score - 4   Sit to Lying Assistance Needed: Physical assistance Total assistance  CARE Score - 1  Assistance Needed: Physical assistance    CARE Score - 3   Sit to Stand Assistance Needed: Physical assistance Total assistance  CARE Score - 1  Assistance Needed: Incidental touching, Adaptive equipment    CARE Score - 4         Mobility Admission Current   Walk 10 Feet Assistance Needed: Physical assistance Total assistance  CARE Score - 1 Assistance Needed: Incidental touching, Adaptive equipment    CARE Score - 4     Walk 50 Feet 2 Turns Assistance Needed: Physical assistance 25% or less  CARE Score - 88 Assistance Needed: Incidental touching, Adaptive equipment    CARE Score - 4   Walk 150 Feet Assistance Needed: Physical assistance 25% or less  CARE Score - 88  Assistance Needed: Incidental touching, Adaptive equipment    CARE Score - 4   Walk 10 Feet Uneven Assistance Needed: Physical assistance 25% or less  CARE Score - 88  Assistance Needed: Incidental touching, Adaptive equipment    CARE Score - 4   1 Step (Curb) Assistance Needed: Incidental touching, Adaptive equipment    CARE Score - 88  Assistance Needed: Incidental touching, Adaptive equipment    CARE Score - 4   4 Steps Assistance Needed: Incidental touching, Adaptive equipment    CARE Score - 88  Assistance Needed: Incidental touching, Adaptive equipment    CARE Score - 4   12 Steps      CARE Score - 88       CARE Score - 88   Picking Up Object      CARE Score - 88       CARE Score - 88   Wheelchair/Scooter Use        Wheel 50 Feet 2 Turns Assistance Needed: Physical assistance Total assistance    CARE Score - Wheel 50 Feet with Two Turns: 1    Type of Wheelchair/Scooter: Manual Assistance Needed: Independent      CARE Score - Wheel 50 Feet with Two Turns: 6    Type of Wheelchair/Scooter: Manual   Wheel 150 Feet Assistance Needed: Physical assistance Total assistance  CARE Score - Wheel 150 Feet: 1    Type of Wheelchair/Scooter: Manual  Assistance Needed: Independent      CARE Score - Wheel 150 Feet: 6    Type of Wheelchair/Scooter: Manual

## 2022-12-09 NOTE — Unmapped (Shared)
[name]  Discharge Summary    Patient Name: [name] [name]       Medical Record Number: [id number]   Date of Birth: 2000-04-06  Sex: Female          Room/Bed: 8J191/4N829-56  Payor Info: Payor: BCBS / Plan: BCBS BLUE OPTIONS/PPO/ADV ([place] ONLY) / Product Type: *No Product type* /      Admit Date: 11/19/2022  Discharge Date: 12/10/2022 *** change date above  Admitting Physician:  Landry Dyke, MD  Discharge Physician: Zenovia Jarred, M.D.  Rehab Impairment Group Code [facility]): (Debility) 16 Debility (Non-Cardiac/Non-Pulmonary); Etiology: Diffuse pain due to muscle spasms of uncertain etiology and cutaneous desmoid tumors     Admission Functional Status: Discharge Functional Status:   Current Functional Status:  ADLs: Needs assistance with ADLs     ADLs - Needs Assistance: LB dressing; Bathing; Grooming; Toileting; UB dressing; Feeding     Feeding - Needs Assistance: Set Up Assist     Grooming - Needs Assistance: Performed seated; Supervision     Bathing - Needs Assistance: Mod assist     Toileting - Needs Assistance: Min assist     UB Dressing - Needs Assistance: Contact Guard assist     LB Dressing - Needs Assistance: Mod assist; Max assist     Mobility:   Bed Mobility comments: pt completed sup to sit Mod A for LE management, HOB elevated, able to manage trunk CGA but increased time needed due to pain/fatigue     Transfers: STS from EOB Min A w/ RW, cues for setup and anterior lean. Increased time for completion. STS from EOC  x 4 bouts after sitting rest breaks w/ RW CGA, PT cued for anterior trunk lean and nose over toes     Skilled Treatment Performed: Pt able to ambulate x 61ft x 4 bouts w/ RW CGA, significantly diminished gait speed, decreased foot clearance and narrow BoS. Pt demo anterior trunk lean and reuqired cues from PT for posture correction. W/ increased fatigue pt demonstrated decreased stride length and increase in shuffle patterning. No overt LOB. Pt required seated rest breaks for tolerane and VS monitored closely. Chair follow.        Cognition/Swallow/Speech:  Patient's Vision Adequate to Safely Complete Daily Activities: Yes     Patient's Judgement Adequate to Safely Complete Daily Activities: Yes     Patient's Memory Adequate to Safely Complete Daily Activities: Yes     Patient Able to Express Needs/Desires: Yes     Patient has speech problem: No PHYSICAL THERAPY    Ambulated 267feet with decreased gait speed and standby/contact guard assist then 140 feet x2 with rolling walker    OCCUPATIONAL THERAPY   ***    SPEECH LANGUAGE PATHOLOGY  Patient seen for brief follow-up session this PM, supportive mother present for session duration. [name] [name] continues to endorse subjective improvement in swallow fxn, indicating that she was able to take pills with liquid rather than puree this morning. Patient and mother in agreement that no further SLP services are currently indicated. Formally signing off, patient advised to seek additional outpatient services should any further needs arise post-discharge.    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 [name] was admitted to [facility] on 09/25/2022 for ileus, inability to tolerate PO now on supplemental tube feed, and diffuse pain due to muscle spasms of uncertain etiology.     Hospital Course:     Increased Tonicity - Dystonia   Started post-procedurally per patient ~09/25/22. Occurs intermittently throughout the day with a heightened occurrence during mobilization. At rest patient bilateral ankles in plantarflexion with worsening heel cord tightening. Has tonicity most prominent to HF and KE that is not velocity dependent. Has not noted significant improvement with increase of baclofen to 15 mg TID in setting of infection as below. Neurology previously evaluated patient seemingly ruling out an acute dystonic reaction, cord compression, central process, nutritional deficiency, stiff person syndrome and [name] after normal EMG on 8/22. Continues to be well controlled with valium, small improvement with baclofen PRN. Continued on both medications at discharge from AIR. Made improvements with physical and occupational therapy during AIR admission. Discharged with home health PT/OT.      Pain - Headaches  Pain management consulted with recommendations as below. Patient will need to follow up with outpatient chronic pain provider ([name]). Gabapentin discontinued 9/17 and will not be continued upon discharge. Removed central line 9/23 and placed PIV. Continued on IV pain medication through PIV. Discussed discharge plan with outpatient pain provider and started IV pain medication wean on 9/28. Ultimately discharged on oral pain regimen with close follow up with [name] shortly after discharge from AIR.      Anasarca (improving)  Patient noted increased lower extremity, facial, and abdominal swelling prior to admission to AIR. Reported that pitting edema was present when taking Lyrica. Gabapentin discontinued 9/17. No current medications with significant side effects of swelling. Requested IV Lasix as this was helpful in the past. Noted that some edema could be related to gabapentin, will plan to monitor and discontinue as indicated. On exam, patient with mild edema in bilateral feet, not appreciated elsewhere on exam. Noted improvement in edema with IV Lasix. Oral Bumex trial 9/18 modestly helpful. Ultimately received 6 doses of IV Lasix during AIR admission. Did not discharge patient on any diuretic medications (patient noted oral regimens unsuccessful in the past).  Recommended close follow up with oncology. Multifactorial Ileus   Nausea (chronic)  C[employer] Emesis 2/2 NGT placement (resolved)  Contributors with history of narcotics, poor PO intake. Concern for malnutrition. Improvements to abdominal pain since weaning opioids. TPN has been discontinued with patient now tolerating tube feeds. Patient with chronic nausea that was previously managed with Zofran, though transitioned to phenergan due to prolonged Qtc. Upon admission to AIR, continued nightly tube feeds with Corpak NG tube in place, and PO intake as tolerated during the day. Will continue to decrease tube feeds with goal of discharge from AIR on full regular diet. If unable to tolerate full PO diet, will consider discharge home with corpak. Patient noted weight gain upon arrival to AIR (felt likely fluid retention) as weight was increased in a short period of time. Dietician followed during AIR admission and patient ultimately discharged home with NG tube and *** regimen for tube feeds. IV anti-emetics discontinued day of discharge and patient provided oral equivalents. She was restarted on her home cefdinir and doxycycline upon discharge for SIBP. Discontinued movantik. She will follow up with GI, Oncology, and chronic pain after discharge.     Syncope  Multifactorial. Primary team ddx includes: vasovagal vs adverse effect of medications  vs deconditioning vs iron deficiency. Last echo 04/2022. Stable upon admission to AIR with no episodes of syncope. Had some episodes of feeling faint or lightheaded that were managed with resting breaks and slower movements with therapy.      IDA  Baseline hemoglobin ~9-10. Stable upon admission to AIR at 9.8. Will continue to monitor while admitted. Upon discharge, hemoglobin stable at ***.      [name] - Desmoid Tumors  Follows [facility] (Dr. [name]) is patient's outpatient oncologist. Per oncology, most common side effects of the chemotherapy are diarrhea, nausea, headache, and rash. Patient has not had increase in baseline nausea while taking the medication and increased dose as planned on 9/17. Will continue to monitor and discuss with outpatient provider as indicated. Noted some dry mouth and skin sensitivity on 9/24, patient reported similar symptoms with chemotherapy in the past. Started PRN ammonium lactate lotion, biotene spray, and mucositis mixture as needed. Noted some red in stool starting 9/24. No other signs/symptoms of bleeding. Hemoglobin remained stable and was *** upon discharge. She will follow up with outpatient oncologist on 12/16/2022.      Enterobacteriaceae group bacteremia (Resolved) - C albicans fungemia (Resolved)  Likely due to RIJ tunneled line placed 01/2022 now removed on 8/14.  8/13, 8/17 repeat blood cultures NGTD.  No evidence of vitreitis, retinitis 8/15 per Ophtho. TTE 8/15 w possible vegetations on MV and TV or indwelling catheter; however, TEE 8/20 w/o vegetations. ID consulted during acute admission and now s/p treatment with cephalosporin and fluconazole on 8/31. Patient noted to have difficult IV access and kept R IJ access upon admission to AIR. Placed peripheral IV on 9/23 and removed central line. No signs/symptoms of infection after removal and patient discharged with no IV access. ***     Generalized anxiety disorder - depression - h/o panic attacks  Psychiatry consulted during acute hospitalization and recommended continued use of home mirtazapine and PRN valium. Neuropsychology consulted during AIR admission for psychosocial support. Patient to follow up with outpatient providers.      DME  - Bedside Commode Necessity: Patient requires a bedside commode as they are confined to a single room and/or level of the home w/ no access to a bathroom on that level.   - Wheelchair Necessity: Patient has mobility limitations that interfere with performing ADLs and MADLs (a custom wheelchair will improve this), mobility limitations that cannot be corrected with a cane or walker. Patient has expressed a willingness to use a wheelchair and has adequate space in their living environment for a wheelchair. Patient has a caregiver who is willing and available to assist patient w/ wheelchair transport.  Dan Humphreys Necessity: Patient requires a rolling walker due to mobility limitation that significantly impairs their ability to participate in one or more mobility related activities of daily living in the home. A mobility limitation provides the beneficiary from accomplishing MRADL entirely. The beneficiary is able to safely use the walker.     Consults: None    Procedures: None    Discharge Medications:      Your Medication List        STOP taking these medications      alum-mag-simeth 200-200-20 mg/5 mL Susp  Commonly known as: MAALOX PLUS     bisacodyl 10 mg suppository  Commonly known as: DULCOLAX     butalbital-acetaminophen-caffeine 50-325-40 mg per tablet  Commonly known as: ESGIC     carboxymethylcellulose sodium 1 % Dpge  Commonly known as: REFRESH CELLUVISC  diclofenac sodium 1 % gel  Commonly known as: VOLTAREN     gabapentin 100 MG capsule  Commonly known as: NEURONTIN     guaiFENesin 100 mg/5 mL syrup  Commonly known as: ROBITUSSIN     HYDROmorphone (PF) 1 mg/mL injection  Commonly known as: DILAUDID     lidocaine-diphenhydrAMINE-aluminum-magnesium 25-200-400-40 mg/24mL Mwsh  Commonly known as: FIRST-MOUTHWASH BLM     naloxegol 12.5 mg tablet  Commonly known as: MOVANTIK     ondansetron 4 mg/2 mL injection  Commonly known as: ZOFRAN     phenol 1.4 % Spra  Commonly known as: CHLORASEPTIC     polyethylene glycol 17 gram packet  Commonly known as: MIRALAX     pseudoephedrine 30 MG tablet  Commonly known as: SUDAFED     senna 8.6 mg tablet  Commonly known as: SENOKOT     sodium chloride 0.9 % solution  Commonly known as: NS            START taking these medications      ammonium lactate 12 % lotion  Commonly known as: LAC-HYDRIN  Apply 1 Application topically two (2) times a day. COURIERED MED OR SUPPLY  OGSVEO 50 MG TABLET.     scopolamine 1 mg over 3 days  Commonly known as: TRANSDERM-SCOP  Place 1 patch (1 mg total) on the skin every third day.  Start taking on: December 09, 2022            CHANGE how you take these medications      acetaminophen 500 MG tablet  Commonly known as: TYLENOL  Take 2 tablets (1,000 mg total) by mouth every eight (8) hours.  What changed: Another medication with the same name was removed. Continue taking this medication, and follow the directions you see here.     diazePAM 5 MG tablet  Commonly known as: VALIUM  Take 1 tablet (5 mg total) by mouth nightly for 5 days.  What changed: Another medication with the same name was removed. Continue taking this medication, and follow the directions you see here.     OGSIVEO 50 mg tablet  Generic drug: nirogacestat  Take 3 tablets (150 mg total) by mouth two (2) times a day. Swallow tablets whole; do not break, crush, or chew.  What changed: medication strength     oxyCODONE 15 MG immediate release tablet  Commonly known as: ROXICODONE  Take 1 tablet (15 mg total) by mouth every four (4) hours as needed for up to 5 days.  What changed: Another medication with the same name was removed. Continue taking this medication, and follow the directions you see here.            CONTINUE taking these medications      baclofen 10 MG tablet  Commonly known as: LIORESAL  Take 1 tablet (10 mg total) by mouth Three (3) times a day as needed for muscle spasms.     buprenorphine-naloxone 2-0.5 mg sublingual film  Commonly known as: SUBOXONE  Place 0.5 Film (1 mg of buprenorphine total) under the tongue Three (3) times a day for 5 days.     cefdinir 300 MG capsule  Commonly known as: OMNICEF  Take 1 capsule (300mg ) twice a day on Mon, Wed, Friday     cholecalciferol (vitamin D3 25 mcg (1,000 units)) 1,000 unit (25 mcg) tablet  Take 1 tablet (25 mcg total) by mouth daily.     clobetasol 0.05 % ointment  Commonly known as: TEMOVATE  Apply topically two (2) times a day. To stubborn/thick rashes on hands/feet ears until clear/smooth. Restart as needed     doxycycline 100 MG tablet  Commonly known as: VIBRA-TABS  TAKE 1 TABLETS BY MOUTH TWICE A DAY ON TUESDAY, THURSDAY, SATURDAY, SUNDAY.     famotidine 20 MG tablet  Commonly known as: PEPCID  Take 1 tablet (20 mg total) by mouth nightly.     hydrocortisone 2.5 % cream  Apply topically two (2) times a day as needed (hemorrhoids).     lidocaine 4 % patch  Commonly known as: ASPERCREME  Place 2 patches on the skin daily.     LORazepam 0.5 MG tablet  Commonly known as: ATIVAN  Take 2 tablets (1 mg total) by mouth Three (3) times a day as needed for anxiety.     mirtazapine 15 MG tablet  Commonly known as: REMERON  Take 1 tablet (15 mg total) by mouth nightly.     multivitamin with folic acid 400 mcg Tab tablet  Take 1 tablet by mouth daily.     naloxone 4 mg/actuation nasal spray  Commonly known as: NARCAN  One spray in either nostril once for known/suspected opioid overdose. May repeat every 2-3 minutes in alternating nostril til EMS arrives     ondansetron 4 MG disintegrating tablet  Commonly known as: ZOFRAN-ODT  Dissolve 1 tablet (4 mg total) in the mouth every six (6) hours as needed for nausea.     pantoprazole 40 MG tablet  Commonly known as: Protonix  Take 1 tablet (40 mg total) by mouth two (2) times a day.     pimecrolimus 1 % cream  Commonly known as: ELIDEL  Apply topically two (2) times a day as needed. To face as needed for rash     promethazine 12.5 MG tablet  Commonly known as: PHENERGAN  Take 1 tablet (12.5 mg total) by mouth every six (6) hours as needed.     triamcinolone 0.1 % cream  Commonly known as: KENALOG  Apply topically two (2) times a day as needed. Apply to rash as needed     zinc oxide-cod liver oil 40 % Pste  Commonly known as: DESITIN 40%  Apply topically daily as needed.            Significant Diagnostic Studies: Reviewed in Epic      Discharge Instructions: Medications:  Please take all medications as prescribed below and note any changes.    Other Instructions and Information:   Follow Up instructions and Outpatient Referrals     Ambulatory Referral to Home Health      Reason for referral: debility secondary to diffuse pain due to muscle   spasms of uncertain etiology and cutaneous desmoid tumors    Physician to follow patient's care: PCP    Disciplines requested:  Nursing  Physical Therapy  Occupational Therapy       Nursing requested: Tube Feeds per tube feed order    Physical Therapy requested: Evaluate and treat    Occupational Therapy Requested: Evaluate and treat    Requested SOC Date:  Comment - within 48 hours    Call MD for:  difficulty breathing, headache or visual disturbances      Call MD for:  persistent nausea or vomiting      Call MD for:  severe uncontrolled pain      Call MD for: Temperature > 38.5 Celsius ( > 101.3 Fahrenheit)      Discharge instructions  Activity Instructions       Activity as tolerated              Follow-Up Appointments:   Please make all follow-up appointments as noted below. If not already scheduled, please set-up an appointment with a Primary Care Provider for continued general medical care.  Other Instructions       Call MD for:  difficulty breathing, headache or visual disturbances      Call MD for:  persistent nausea or vomiting      Call MD for:  severe uncontrolled pain      Call MD for: Temperature > 38.5 Celsius ( > 101.3 Fahrenheit)      Discharge instructions      You were hospitalized at [facility] after prolong hospital stay leading to debility. You have made excellent progress during your rehab stay.     MEDICATIONS:  - Use an application or web page such as [email or web address] to create an easy to read schedule regarding when to take your medications.  -- you have been prescribed enough medications through your appointment with oncology. Please reach out to your oncology team or pain management team for refills.     DIET:  - regular diet as tolerated  - tube feeds as tolerated; continue to follow up with GI to wean off tube feeds and increase PO intake as tolerated    ACTIVITY:  Recovery takes several months, especially secondary to chronic illness. Your body uses a lot of energy recovering and needs time and rest. Gradually increase your activity taking rest periods as needed to ensure that you are being safe.     ADDITIONAL INSTRUCTIONS:  - You may remove your bandage at your previous central line site    WHEN TO CALL YOUR PHYSICIAN:  Following discharge from the hospital, please call 911 immediately and go to the nearest Emergency Department if you notice:  - Temperature greater than 101.34F or chills  - Pain not controlled with prescribed medications  - Uncontrolled nausea or vomiting  - Worsening or persistent abdominal pain  - Inability to pass stool for 3 days or more   - Severe or worsening headache or acute changes in vision  - Weakness or loss of sensation in your arms or legs  - Chest pain  - Shortness of breath    If you develop these symptoms or if you have trouble obtaining any of your medications, you may also call the [name] at [phone] as needed.    You may go to the [name] at [address] in [place] or call the [name] Link at [phone] for further assistance.    FOLLOW-UP:  Please see the list below of follow-up appointments and make note of the additional providers you will need to see:    - Follow-up with oncology as previously scheduled; please reach out to their office for schedule specifics   - Follow-up with chronic pain as previously scheduled  - Follow-up with gastroenterology as previously scheduled  - You have been referred to home health therapy and will receive a call to organize these visits     [facility]  [facility]   [address] [place], [place] [address]   Phone: [phone]  Fax: [phone]          Appointments which have been scheduled for you  Dec 09, 2022 9:00 AM  OT TREATMENT with Maureen Ralphs, OT  [facility] OCCUPATIONAL THERAPY [facility]) [address]  [place] [address]        Dec 09, 2022 10:00 AM  [facility] THERAPY TX with Alfonso Ramus, LRT/CTRS   [facility] RECREATIONAL THERAPY [facility]) [address]  [place] [address]        Dec 09, 2022 1:00 PM  PT TREATMENT with Manning Charity, PT  [facility] PHYSICAL THERAPY [facility]) [address]  [place] [address]        Dec 09, 2022 2:00 PM  TREATMENT with Manning Charity, PT  [facility] PHYSICAL THERAPY [facility] REGION) [address]  [place] [address]        Dec 11, 2022 2:30 PM  (Arrive by 2:00 PM)  RETURN VIDEO MYCHART with Lissa Morales, MD  [facility] [address] [facility] CANCER HOSP [place] [facility] REGION) [address]  [place] [address]  [phone]   Please sign into My [place] Chart at least 15 minutes before your appointment to complete the eCheck-In process. You must complete eCheck-In before you can start your video visit. We also recommend testing your audio and video connection to troubleshoot any issues before your visit begins. Click ???Join Video Visit??? to complete these checks. Once you have completed eCheck-In and tested your audio and video, click ???Join Call??? to connect to your visit.     For your video visit, you will need a computer with a working camera, speaker and microphone, a smartphone, or a tablet with internet access.    My [place] Chart enables you to manage your health, send non-urgent messages to your provider, view your test results, schedule and manage appointments, and request prescription refills securely and conveniently from your computer or mobile device.    You can go to [email or web address] to sign in to your My [place] Chart account with your username and password. If you have forgotten your username or password, please choose the ???Forgot Username???? and/or ???Forgot Password???? links to gain access. You also can access your My [place] Chart account with the free MyChart mobile app for Android or iPhone.    If you need assistance accessing your My  Chart account or for assistance in reaching your provider's office to reschedule or cancel your appointment, please call [facility] [phone].         Dec 14, 2022 10:00 AM  PT TREATMENT with REHAB PT [facility]  REHAB PHYSICAL THERAPY [facility] REGION) [address]  [place] [address]        Dec 18, 2022 10:00 AM  (Arrive by 9:45 AM)  RETURN PEDS with Gala Romney, MD  [name] [place] [facility]) [address]  [place] [address]  [phone]        Dec 23, 2022 9:30 AM  (Arrive by 9:00 AM)  RETURN  NON PROCEDURAL with Nat Christen., MD  [facility]NAG[facility] C[facility] [address] [name] [address] [facility] [place] [facility] [place]) [address]  [place] [address]  [phone]        Dec 25, 2022 3:30 PM  (Arrive by 3:15 PM)  RETURN IBD with Modena Nunnery, MD  [facility] GI MEDIC[facility] [place]) [address]  [facility] [address] through [address]  [place] [address]  [phone]        Jan 14, 2023 10:45 AM  (Arrive by 10:30 AM)  RETURN HEM BENIGN with Cassiopeia Thurston [place], [place]  [facility] [place] [facility] [place]) [address] [address]  [facility] 1 through 4  [place] [address]  [id number]        Jan 16, 2023 8:30 AM  (Arrive by 8:15 AM)  RETURN  ENDOCRINE with Bonnita Hollow, MD  [facility] DIABETES AND ENDOCRINOLOGY [place] [facility] REGION) [address]  [name] 1 through 4  [place] [address]  [phone]        Feb 20, 2023 10:00 AM  (Arrive by 9:45 AM)  NEW UROGYN with Kary Kos, MD  [facility] AND RECON PELVIC SURGERY [facility] [place] [facility]) [address]  [address]  [place] [address]  [phone]             Discharge Day Services:  The patient was seen and examined on the day of discharge. Vitals signs and exam are stable. Therapy goals met. Discharge medications and instructions were discussed with the patient and family, and all questions were answered.    Time spent for discharge: 30 minutes or greater    Physical Exam:  Vitals:    12/08/22 1730   BP: 105/56   Pulse: 91   Resp: 18   Temp: 36.9 ??C (98.4 ??F)   SpO2: 96%            GEN: ***  EYES: Sclera anicteric, conjunctiva clear   HENT: NCAT, MMM, OP clear, NGT in place, bandage over previous CVAD site  NECK: Trachea midline  RESP: CTAB, NWOB on RA  CV: Rate as above, regular rhythm  GI: Not distended  SKIN: Without lesions, bruises, ecchymosis, rashes on clothed examination ***  MSK: TTP to bilateral knees (R>L) with passive/active range of motion ***  NEURO:              Mental Status: A&Ox3, attention intact, speech fluid and coherent, follows commands well and answers questions appropriately              Cranial Nerve: CN II-XII grossly intact              Sensory: BUE and BLE sensation intact to light touch              Motor:   - RUE 5/5 shoulder abd, 5/5 EF, 5/5 EE, 5/5 WE, 4+/5 HG  - LUE 5/5 shoulder abd, 5/5 EF, 5/5 EE, 5/5 WE, 4+/5 HG  - RLE 2/5 HF, 3/5 KE, 2/5 DF, 3/5 PF  - LLE 2/5 HF, 3/5 KE, 1/5 DF, 3/5 PF  *Lower extremity exam limited by pain              *** Tone: Stable tone most prominent to bilateral lower extremities with hip flexion, knee flexor & knee extension, tone is variable, there is increasd tone to bilateral gastroc-soleus complex               Reflexes: No clonus              Cerebellar: no abnormal or extraneous movements              Gait: Deferred  PSYCH: Mood euthymic, affect appropriate, thought process logical  Discharge Condition: Improved    Discharge Disposition: home with family assistance/supervision    Home Health: Physical Therapy and Occupational Therapy    Outpatient Therapy: None     Dictation software was used while making this note. Please excuse any errors made with dictation software and interpret errors as such.

## 2022-12-09 NOTE — Unmapped (Signed)
Received patient lying in bed, AAOx4 with mother at bedside. Patient tolerated all due medications given. Patient c/o pain managed by PRN oxycodone. No signs of distress noted. NG tube patent, bolus feeding given, tolerated well. Skin and safety precaution maintained. All needs attended to. Call bell within reach. Bed in lowest position. No acute events overnight.         Problem: Rehabilitation (IRF) Plan of Care  Goal: Plan of Care Review  Outcome: Progressing  Goal: Patient-Specific Goal (Individualized)  Outcome: Progressing  Goal: Absence of New-Onset Illness or Injury  Outcome: Progressing  Intervention: Prevent Fall and Fall Injury  Recent Flowsheet Documentation  Taken 12/09/2022 0000 by Roylene Reason, RN  Safety Interventions: fall reduction program maintained  Taken 12/08/2022 2200 by Roylene Reason, RN  Safety Interventions: fall reduction program maintained  Taken 12/08/2022 2000 by Roylene Reason, RN  Safety Interventions: fall reduction program maintained  Intervention: Prevent Skin Injury  Recent Flowsheet Documentation  Taken 12/08/2022 2105 by Roylene Reason, RN  Device Skin Pressure Protection: positioning supports utilized  Intervention: Prevent Infection  Recent Flowsheet Documentation  Taken 12/09/2022 0000 by Roylene Reason, RN  Infection Prevention: hand hygiene promoted  Taken 12/08/2022 2200 by Roylene Reason, RN  Infection Prevention: hand hygiene promoted  Taken 12/08/2022 2000 by Roylene Reason, RN  Infection Prevention: hand hygiene promoted  Goal: Optimal Comfort and Wellbeing  Outcome: Progressing  Goal: Home and Community Transition Plan Established  Outcome: Progressing  Goal: Rounds/Family Conference  Outcome: Progressing     Problem: Self-Care Deficit  Goal: Improved Ability to Complete Activities of Daily Living  Outcome: Progressing     Problem: Skin Injury Risk Increased  Goal: Skin Health and Integrity  Outcome: Progressing  Intervention: Optimize Skin Protection  Recent Flowsheet Documentation  Taken 12/09/2022 0000 by Roylene Reason, RN  Pressure Reduction Techniques: frequent weight shift encouraged  Taken 12/08/2022 2200 by Roylene Reason, RN  Pressure Reduction Techniques: frequent weight shift encouraged  Taken 12/08/2022 2105 by Roylene Reason, RN  Pressure Reduction Techniques: frequent weight shift encouraged  Pressure Reduction Devices: positioning supports utilized  Taken 12/08/2022 2000 by Roylene Reason, RN  Pressure Reduction Techniques: frequent weight shift encouraged     Problem: Pain Acute  Goal: Optimal Pain Control and Function  Outcome: Progressing     Problem: Fall Injury Risk  Goal: Absence of Fall and Fall-Related Injury  Outcome: Progressing  Intervention: Promote Injury-Free Environment  Recent Flowsheet Documentation  Taken 12/09/2022 0000 by Roylene Reason, RN  Safety Interventions: fall reduction program maintained  Taken 12/08/2022 2200 by Roylene Reason, RN  Safety Interventions: fall reduction program maintained  Taken 12/08/2022 2000 by Roylene Reason, RN  Safety Interventions: fall reduction program maintained     Problem: Nausea and Vomiting  Goal: Nausea and Vomiting Relief  Outcome: Progressing     Problem: Oral Intake Inadequate  Goal: Improved Oral Intake  Outcome: Progressing

## 2022-12-09 NOTE — Unmapped (Signed)
[facility] Refill Coordination Note    Specialty Medication(s) to be Shipped:   Hematology/Oncology: [name]    Other medication(s) to be shipped: No additional medications requested for fill at this time     [name] [name], DOB: Apr 10, 2000  Phone: [phone] (home) [phone] (work)      All above HIPAA information was verified with patient's family member, mother.     Was a Nurse, learning disability used for this call? No    Completed refill call assessment today to schedule patient's medication shipment from the [facility]  [phone]).  All relevant notes have been reviewed.     Specialty medication(s) and dose(s) confirmed:  Ramped up to 3 tablets (150 mg) twice daily    Changes to medications: [name] [name] reports no changes at this time.  Changes to insurance: No  New side effects reported not previously addressed with a pharmacist or physician: None reported  Questions for the pharmacist: No    Confirmed patient received a Conservation officer, historic buildings and a Surveyor, mining with first shipment. The patient will receive a drug information handout for each medication shipped and additional FDA Medication Guides as required.       DISEASE/MEDICATION-SPECIFIC INFORMATION        N/A    SPECIALTY MEDICATION ADHERENCE     Medication Adherence    Patient reported X missed doses in the last month: >5  Specialty Medication: Ogsiveo 150 mg twice daily  Patient is on additional specialty medications: No  Informant: mother  Reasons for non-adherence: instructed by provider to hold or take differently  Confirmed plan for next specialty medication refill: delivery by pharmacy  Refills needed for supportive medications: not needed          Refill Coordination    Has the Patients' Contact Information Changed: No  Is the Shipping Address Different: No         Were doses missed due to medication being on hold? Yes - doses were held then ramping back up to 150 mg twice daily dose once resumed    Ogsiveo 50 mg: 5 days of medicine on hand     REFERRAL TO PHARMACIST     Referral to the pharmacist: Not needed      [facility]     Shipping address confirmed in Epic.       Delivery Scheduled: Yes, Expected medication delivery date: 12/11/22.     Medication will be delivered via UPS to the prescription address in Epic WAM.    Kermit Balo, [facility]   [facility]  Specialty Pharmacist

## 2022-12-10 MED ORDER — PROMETHAZINE 12.5 MG TABLET
ORAL_TABLET | Freq: Four times a day (QID) | ORAL | 0 refills | 30 days | Status: CP | PRN
Start: 2022-12-10 — End: 2023-01-09
  Filled 2022-12-10: qty 120, 30d supply, fill #0

## 2022-12-10 MED ADMIN — diclofenac sodium (VOLTAREN) 1 % gel 2 g: 2 g | TOPICAL | @ 14:00:00 | Stop: 2022-12-10

## 2022-12-10 MED ADMIN — oxyCODONE (ROXICODONE) immediate release tablet 10 mg: 10 mg | ORAL | @ 14:00:00 | Stop: 2022-12-10

## 2022-12-10 MED ADMIN — naloxegol (MOVANTIK) 12.5 mg tablet 12.5 mg: 12.5 mg | ORAL | @ 14:00:00 | Stop: 2022-12-10

## 2022-12-10 MED ADMIN — diazePAM (VALIUM) tablet 2.5 mg: 2.5 mg | ORAL | @ 14:00:00 | Stop: 2022-12-10

## 2022-12-10 MED ADMIN — multivitamin with folic acid 400 mcg tablet 1 tablet: 1 | ORAL | @ 14:00:00 | Stop: 2022-12-10

## 2022-12-10 MED ADMIN — ammonium lactate (LAC-HYDRIN) 12 % lotion 1 Application: 1 | TOPICAL | @ 14:00:00 | Stop: 2022-12-10

## 2022-12-10 MED ADMIN — pantoprazole (Protonix) EC tablet 40 mg: 40 mg | ORAL | @ 02:00:00

## 2022-12-10 MED ADMIN — promethazine (PHENERGAN) 6.5 mg in sodium chloride (NS) 0.9 % 50 mL IVPB: 6.5 mg | INTRAVENOUS

## 2022-12-10 MED ADMIN — carboxymethylcellulose sodium (REFRESH CELLUVISC) 1 % ophthalmic gel 1 drop: 1 [drp] | OPHTHALMIC | @ 14:00:00 | Stop: 2022-12-10

## 2022-12-10 MED ADMIN — HYDROmorphone (PF) (DILAUDID) injection 0.5 mg: .5 mg | INTRAVENOUS | @ 18:00:00 | Stop: 2022-12-10

## 2022-12-10 MED ADMIN — nirogacestat (OGSIVEO) tablet 150 mg: 150 mg | ORAL | @ 11:00:00 | Stop: 2022-12-10

## 2022-12-10 MED ADMIN — buprenorphine-naloxone (SUBOXONE) 2-0.5 mg SL film 1 mg of buprenorphine: .5 | SUBLINGUAL | @ 02:00:00

## 2022-12-10 MED ADMIN — pantoprazole (Protonix) EC tablet 40 mg: 40 mg | ORAL | @ 14:00:00 | Stop: 2022-12-10

## 2022-12-10 MED ADMIN — lidocaine (ASPERCREME) 4 % 2 patch: 2 | TRANSDERMAL | @ 14:00:00 | Stop: 2022-12-10

## 2022-12-10 MED ADMIN — buprenorphine-naloxone (SUBOXONE) 2-0.5 mg SL film 1 mg of buprenorphine: .5 | SUBLINGUAL | @ 14:00:00 | Stop: 2022-12-10

## 2022-12-10 MED ADMIN — diazePAM (VALIUM) tablet 5 mg: 5 mg | ORAL | @ 02:00:00

## 2022-12-10 MED ADMIN — famotidine (PEPCID) tablet 20 mg: 20 mg | ORAL | @ 02:00:00

## 2022-12-10 MED ADMIN — ondansetron (ZOFRAN) injection 4 mg: 4 mg | INTRAVENOUS | @ 14:00:00 | Stop: 2022-12-10

## 2022-12-10 MED ADMIN — oxyCODONE (ROXICODONE) immediate release tablet 15 mg: 15 mg | ORAL | @ 05:00:00 | Stop: 2022-12-10

## 2022-12-10 MED ADMIN — promethazine (PHENERGAN) 6.5 mg in sodium chloride (NS) 0.9 % 50 mL IVPB: 6.5 mg | INTRAVENOUS | @ 16:00:00 | Stop: 2022-12-10

## 2022-12-10 NOTE — Unmapped (Signed)
No acute events/changes overnight. Patient is alert and oriented. Continent of bladder and bowel. Pain managed with scheduled and PRN pain medications. Nausea relieved with PRN IV phenergan. NG tube patent. Bolus tube feed tolerated well. Protein supplementation refused. Safety measures in place. Mother at bedside. Resting quietly in bed at the moment with no s/s of distress. Will continue to monitor.      Problem: Rehabilitation (IRF) Plan of Care  Goal: Plan of Care Review  Outcome: Progressing  Goal: Patient-Specific Goal (Individualized)  Outcome: Progressing  Goal: Absence of New-Onset Illness or Injury  Outcome: Progressing  Intervention: Prevent Fall and Fall Injury  Recent Flowsheet Documentation  Taken 12/10/2022 0400 by Gwyneth Sprout, RN  Safety Interventions: fall reduction program maintained  Taken 12/10/2022 0200 by Gwyneth Sprout, RN  Safety Interventions: fall reduction program maintained  Taken 12/10/2022 0000 by Gwyneth Sprout, RN  Safety Interventions: fall reduction program maintained  Taken 12/09/2022 2200 by Gwyneth Sprout, RN  Safety Interventions:   fall reduction program maintained   low bed  Taken 12/09/2022 2000 by Gwyneth Sprout, RN  Safety Interventions: fall reduction program maintained  Intervention: Prevent Skin Injury  Recent Flowsheet Documentation  Taken 12/09/2022 2200 by Gwyneth Sprout, RN  Device Skin Pressure Protection: absorbent pad utilized/changed  Taken 12/09/2022 2147 by Gwyneth Sprout, RN  Device Skin Pressure Protection: absorbent pad utilized/changed  Taken 12/09/2022 2000 by Gwyneth Sprout, RN  Device Skin Pressure Protection: absorbent pad utilized/changed  Intervention: Prevent Infection  Recent Flowsheet Documentation  Taken 12/09/2022 2200 by Gwyneth Sprout, RN  Infection Prevention: rest/sleep promoted  Taken 12/09/2022 2000 by Gwyneth Sprout, RN  Infection Prevention:   hand hygiene promoted   rest/sleep promoted  Intervention: Prevent VTE (Venous Thromboembolism)  Recent Flowsheet Documentation  Taken 12/09/2022 2147 by Gwyneth Sprout, RN  VTE Prevention/Management: ambulation promoted     Problem: Fall Injury Risk  Goal: Absence of Fall and Fall-Related Injury  Outcome: Progressing  Intervention: Promote Scientist, clinical (histocompatibility and immunogenetics) Documentation  Taken 12/10/2022 0400 by Gwyneth Sprout, RN  Safety Interventions: fall reduction program maintained  Taken 12/10/2022 0200 by Gwyneth Sprout, RN  Safety Interventions: fall reduction program maintained  Taken 12/10/2022 0000 by Gwyneth Sprout, RN  Safety Interventions: fall reduction program maintained  Taken 12/09/2022 2200 by Gwyneth Sprout, RN  Safety Interventions:   fall reduction program maintained   low bed  Taken 12/09/2022 2000 by Gwyneth Sprout, RN  Safety Interventions: fall reduction program maintained

## 2022-12-10 NOTE — Unmapped (Signed)
Problem: Rehabilitation (IRF) Plan of Care  Goal: Plan of Care Review  Outcome: Resolved  Goal: Patient-Specific Goal (Individualized)  Outcome: Resolved  Goal: Absence of New-Onset Illness or Injury  Outcome: Resolved  Intervention: Prevent Fall and Fall Injury  Recent Flowsheet Documentation  Taken 12/10/2022 0730 by Hassan Buckler, RN  Safety Interventions:   fall reduction program maintained   low bed   nonskid shoes/slippers when out of bed  Intervention: Prevent Infection  Recent Flowsheet Documentation  Taken 12/10/2022 0730 by Hassan Buckler, RN  Infection Prevention:   single patient room provided   rest/sleep promoted  Goal: Optimal Comfort and Wellbeing  Outcome: Resolved  Goal: Home and Community Transition Plan Established  Outcome: Resolved  Goal: Rounds/Family Conference  Outcome: Resolved     Problem: Self-Care Deficit  Goal: Improved Ability to Complete Activities of Daily Living  Outcome: Resolved     Problem: Skin Injury Risk Increased  Goal: Skin Health and Integrity  Outcome: Resolved  Intervention: Optimize Skin Protection  Recent Flowsheet Documentation  Taken 12/10/2022 0730 by Hassan Buckler, RN  Pressure Reduction Techniques: frequent weight shift encouraged     Problem: Pain Acute  Goal: Optimal Pain Control and Function  Outcome: Resolved     Problem: Fall Injury Risk  Goal: Absence of Fall and Fall-Related Injury  Outcome: Resolved  Intervention: Promote Injury-Free Environment  Recent Flowsheet Documentation  Taken 12/10/2022 0730 by Hassan Buckler, RN  Safety Interventions:   fall reduction program maintained   low bed   nonskid shoes/slippers when out of bed     Problem: Nausea and Vomiting  Goal: Nausea and Vomiting Relief  Outcome: Resolved  Intervention: Prevent and Manage Nausea and Vomiting  Recent Flowsheet Documentation  Taken 12/10/2022 0730 by Hassan Buckler, RN  Oral Care: teeth brushed     Problem: Oral Intake Inadequate  Goal: Improved Oral Intake  Outcome: Resolved

## 2022-12-10 NOTE — Unmapped (Signed)
Patient is alert and oriented x4. Continent of bladder and bowel, blood in stool x2, provider aware. Chronic pain managed with PRN oxy x2 and IV dilaudid. PRN IV zofran and phenergan given once each for nausea.  Skin remains intact, no new skin issues noted. IV leaking @1530 , removed. NG-tube patent, protein given x1. Safety and fall precautions maintained. Mother remains at bedside. Call bell and belongings within reach, bed low and locked. Will continue to monitor.      Problem: Rehabilitation (IRF) Plan of Care  Goal: Plan of Care Review  Outcome: Progressing  Goal: Patient-Specific Goal (Individualized)  Outcome: Progressing  Goal: Absence of New-Onset Illness or Injury  Outcome: Progressing  Intervention: Prevent Fall and Fall Injury  Recent Flowsheet Documentation  Taken 12/09/2022 1600 by Regenia Skeeter, RN  Safety Interventions: fall reduction program maintained  Taken 12/09/2022 1200 by Regenia Skeeter, RN  Safety Interventions: fall reduction program maintained  Taken 12/09/2022 0800 by Regenia Skeeter, RN  Safety Interventions: fall reduction program maintained  Taken 12/09/2022 0730 by Regenia Skeeter, RN  Safety Interventions: fall reduction program maintained  Intervention: Prevent Skin Injury  Recent Flowsheet Documentation  Taken 12/09/2022 1600 by Regenia Skeeter, RN  Skin Protection: incontinence pads utilized  Taken 12/09/2022 1200 by Regenia Skeeter, RN  Skin Protection: incontinence pads utilized  Taken 12/09/2022 0800 by Regenia Skeeter, RN  Device Skin Pressure Protection: absorbent pad utilized/changed  Skin Protection: incontinence pads utilized  Taken 12/09/2022 0730 by Regenia Skeeter, RN  Device Skin Pressure Protection: absorbent pad utilized/changed  Skin Protection: incontinence pads utilized  Intervention: Prevent Infection  Recent Flowsheet Documentation  Taken 12/09/2022 1600 by Regenia Skeeter, RN  Infection Prevention: hand hygiene promoted  Taken 12/09/2022 1200 by Regenia Skeeter, RN  Infection Prevention: hand hygiene promoted  Taken 12/09/2022 0800 by Regenia Skeeter, RN  Infection Prevention: hand hygiene promoted  Taken 12/09/2022 0730 by Regenia Skeeter, RN  Infection Prevention: hand hygiene promoted  Goal: Optimal Comfort and Wellbeing  Outcome: Progressing  Goal: Home and Community Transition Plan Established  Outcome: Progressing  Goal: Rounds/Family Conference  Outcome: Progressing     Problem: Self-Care Deficit  Goal: Improved Ability to Complete Activities of Daily Living  Outcome: Progressing     Problem: Skin Injury Risk Increased  Goal: Skin Health and Integrity  Outcome: Progressing  Intervention: Optimize Skin Protection  Recent Flowsheet Documentation  Taken 12/09/2022 1600 by Regenia Skeeter, RN  Pressure Reduction Techniques: frequent weight shift encouraged  Pressure Reduction Devices: chair cushion utilized  Skin Protection: incontinence pads utilized  Taken 12/09/2022 1200 by Regenia Skeeter, RN  Pressure Reduction Techniques: frequent weight shift encouraged  Pressure Reduction Devices: chair cushion utilized  Skin Protection: incontinence pads utilized  Taken 12/09/2022 0800 by Regenia Skeeter, RN  Pressure Reduction Techniques: frequent weight shift encouraged  Pressure Reduction Devices: positioning supports utilized  Skin Protection: incontinence pads utilized  Taken 12/09/2022 0730 by Regenia Skeeter, RN  Pressure Reduction Techniques: frequent weight shift encouraged  Pressure Reduction Devices: positioning supports utilized  Skin Protection: incontinence pads utilized     Problem: Pain Acute  Goal: Optimal Pain Control and Function  Outcome: Progressing     Problem: Fall Injury Risk  Goal: Absence of Fall and Fall-Related Injury  Outcome: Progressing  Intervention: Promote Scientist, clinical (histocompatibility and immunogenetics) Documentation  Taken 12/09/2022 1600 by Regenia Skeeter, RN  Safety Interventions: fall reduction program maintained  Taken 12/09/2022 1200 by Regenia Skeeter, RN  Safety Interventions: fall reduction program maintained  Taken 12/09/2022 0800 by Regenia Skeeter, RN  Safety Interventions: fall reduction program maintained  Taken 12/09/2022 0730 by Regenia Skeeter, RN  Safety Interventions: fall reduction program maintained     Problem: Nausea and Vomiting  Goal: Nausea and Vomiting Relief  Outcome: Progressing     Problem: Oral Intake Inadequate  Goal: Improved Oral Intake  Outcome: Progressing

## 2022-12-10 NOTE — Unmapped (Signed)
Pt received IV phenergan and IV Dilaudid as per pt request. PIV removed. Pt received discharge instructions with in-person discharge RN. All equipment delivered to the room. Pt left accompanied with her mother on a wheelchair.

## 2022-12-11 DIAGNOSIS — R1084 Generalized abdominal pain: Secondary | ICD-10-CM | POA: Diagnosis not present

## 2022-12-11 DIAGNOSIS — R109 Unspecified abdominal pain: Secondary | ICD-10-CM | POA: Diagnosis not present

## 2022-12-11 DIAGNOSIS — K91850 Pouchitis: Secondary | ICD-10-CM | POA: Diagnosis not present

## 2022-12-11 DIAGNOSIS — Q8789 Other specified congenital malformation syndromes, not elsewhere classified: Secondary | ICD-10-CM | POA: Diagnosis not present

## 2022-12-12 ENCOUNTER — Emergency Department (HOSPITAL_COMMUNITY): Payer: BC Managed Care – PPO

## 2022-12-12 ENCOUNTER — Other Ambulatory Visit: Payer: Self-pay

## 2022-12-12 ENCOUNTER — Emergency Department (HOSPITAL_COMMUNITY)
Admission: EM | Admit: 2022-12-12 | Discharge: 2022-12-12 | Disposition: A | Discharge status: Home / Self Care | Payer: BC Managed Care – PPO | Attending: Emergency Medicine | Admitting: Emergency Medicine

## 2022-12-12 ENCOUNTER — Encounter (HOSPITAL_COMMUNITY): Payer: Self-pay

## 2022-12-12 DIAGNOSIS — T85528A Displacement of other gastrointestinal prosthetic devices, implants and grafts, initial encounter: Secondary | ICD-10-CM | POA: Diagnosis not present

## 2022-12-12 DIAGNOSIS — T17920A Food in respiratory tract, part unspecified causing asphyxiation, initial encounter: Secondary | ICD-10-CM | POA: Diagnosis not present

## 2022-12-12 DIAGNOSIS — Z4659 Encounter for fitting and adjustment of other gastrointestinal appliance and device: Secondary | ICD-10-CM | POA: Diagnosis not present

## 2022-12-12 DIAGNOSIS — R1084 Generalized abdominal pain: Secondary | ICD-10-CM | POA: Diagnosis not present

## 2022-12-12 DIAGNOSIS — G8911 Acute pain due to trauma: Secondary | ICD-10-CM | POA: Diagnosis not present

## 2022-12-12 DIAGNOSIS — R633 Feeding difficulties, unspecified: Secondary | ICD-10-CM | POA: Diagnosis not present

## 2022-12-12 DIAGNOSIS — Z4682 Encounter for fitting and adjustment of non-vascular catheter: Secondary | ICD-10-CM | POA: Diagnosis not present

## 2022-12-12 DIAGNOSIS — Q8789 Other specified congenital malformation syndromes, not elsewhere classified: Secondary | ICD-10-CM | POA: Diagnosis not present

## 2022-12-12 DIAGNOSIS — R000 Tachycardia, unspecified: Secondary | ICD-10-CM | POA: Diagnosis not present

## 2022-12-12 DIAGNOSIS — R109 Unspecified abdominal pain: Secondary | ICD-10-CM | POA: Diagnosis not present

## 2022-12-12 DIAGNOSIS — R071 Chest pain on breathing: Secondary | ICD-10-CM | POA: Diagnosis not present

## 2022-12-12 DIAGNOSIS — K91850 Pouchitis: Secondary | ICD-10-CM | POA: Diagnosis not present

## 2022-12-12 MED ORDER — DIAZEPAM 5 MG/ML IJ SOLN
2.5000 mg | Freq: Once | INTRAMUSCULAR | Status: AC
  Administered 2022-12-12: 2.5 mg via INTRAVENOUS
  Filled 2022-12-12: qty 2

## 2022-12-12 MED ORDER — HYDROMORPHONE HCL 1 MG/ML IJ SOLN: 1.0000 mg | Freq: Once | INTRAMUSCULAR | Status: DC

## 2022-12-12 MED ORDER — SODIUM CHLORIDE 0.9 % IV SOLN
25.0000 mg | Freq: Once | INTRAVENOUS | Status: AC
  Administered 2022-12-12: 25 mg via INTRAVENOUS
  Filled 2022-12-12: qty 25

## 2022-12-12 MED ORDER — HYDROMORPHONE HCL 1 MG/ML IJ SOLN
1.0000 mg | Freq: Once | INTRAMUSCULAR | Status: AC
  Administered 2022-12-12: 1 mg via INTRAVENOUS
  Filled 2022-12-12: qty 1

## 2022-12-12 MED ORDER — ONDANSETRON HCL 4 MG/2ML IJ SOLN
4.0000 mg | Freq: Once | INTRAMUSCULAR | Status: AC
  Administered 2022-12-12: 4 mg via INTRAVENOUS
  Filled 2022-12-12: qty 2

## 2022-12-12 MED ORDER — DIATRIZOATE MEGLUMINE & SODIUM 66-10 % PO SOLN
30.0000 mL | Freq: Once | ORAL | Status: AC
  Administered 2022-12-12: 2 mL
  Filled 2022-12-12: qty 30

## 2022-12-12 NOTE — Procedures (Signed)
  Procedure:  FLuoro guided nasoenteric tube repositioning : tip in duodenum, ok for post pyloric feeds Preprocedure diagnosis:  Malpositioned feeding tube Postprocedure diagnosis: same EBL:    none Complications:   none immediate  See full dictation in [employer].  Thora Lance MD Main # [phone] Pager  [phone] Mobile [phone]

## 2022-12-12 NOTE — ED Triage Notes (Signed)
Patient has a corpak feeding tube and has been vomtiing and it has curled up in her throat and feels it is blocking her airway.

## 2022-12-12 NOTE — ED Notes (Signed)
Tube feeding syringes provided to the pt. Pts coretrack flushed with sterile water.

## 2022-12-12 NOTE — ED Provider Notes (Signed)
[place] EMERGENCY DEPARTMENT AT [facility] Provider Note   CSN: [id number] Arrival date & time: 12/12/22  1654     History  Chief Complaint  Patient presents with   Feeding Intolerance    Lisa Donovan is a 22 y.o. female.  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  Pt was just d/c from [facility] after an 11 week admission for sepsis and ileus unable to tolerate oral fluids.  Pt is now on a [place] NG tube.  She was at [employer] and threw up.  The tube came up and she feels like it is blocking her airway unless she sits forward.  She has pain all over, but that is chronic for her.       Home Medications Prior to Admission medications   Medication Sig Start Date End Date Taking? Authorizing Provider  acetaminophen (TYLENOL) 500 MG tablet Take 1,000 mg by mouth every 6 (six) hours as needed for moderate pain.   Yes [provider]  ammonium lactate (LAC-HYDRIN) 12 % lotion Apply 1 Application topically in the morning and at bedtime.   Yes [provider]  baclofen (LIORESAL) 10 MG tablet Take 1 tablet by mouth 2 (two) times daily as needed (muscle pains). 10/23/21  Yes [provider]  Buprenorphine HCl-Naloxone HCl 2-0.5 MG FILM Place 1 mg of opioid under the tongue in the morning, at noon, and at bedtime. 12/08/22 12/15/22 Yes [provider]  diazepam (VALIUM) 5 MG tablet Take 5 mg by mouth at bedtime. 12/08/22 12/15/22 Yes [provider]  famotidine (PEPCID) 20 MG tablet Take 1 tablet (20 mg total) by mouth 2 (two) times daily. Patient taking differently: Take 20 mg by mouth at bedtime. 05/14/21  Yes Sabas Sous, MD  hydrocortisone (ANUSOL-HC) 2.5 % rectal cream Place rectally 3 (three) times daily. Patient taking differently: Place 1 application  rectally 3 (three) times daily as needed for hemorrhoids or anal itching.  03/02/21  Yes Sheikh, Omair Latif, DO  lidocaine 4 % Place 2 patches onto the skin as needed (pain).   Yes [provider]  mirtazapine (REMERON) 15 MG tablet Take 15 mg by mouth at bedtime.   Yes [provider]  Multiple Vitamin (MULTIVITAMINS PO) Take 400 mcg by mouth daily. W/ Folic Acid   Yes [provider]  naloxegol oxalate (MOVANTIK) 25 MG TABS tablet Take 25 mg by mouth daily.   Yes [provider]  naloxone (NARCAN) nasal spray 4 mg/0.1 mL Place 1 spray into the nose once.   Yes [provider]  nirogacestat hydrobromide (OGSIVEO) 50 MG tablet Take 150 mg by mouth 2 (two) times daily.   Yes [provider]  ondansetron (ZOFRAN-ODT) 8 MG disintegrating tablet Take 1 tablet (8 mg total) by mouth every 8 (eight) hours as needed for nausea or vomiting. 11/15/21  Yes Jarold Motto, PA  oxyCODONE (OXY IR/ROXICODONE) 5 MG immediate release tablet Take 10-15 mg by mouth 2 (two) times daily as needed for moderate pain. 03/14/21  Yes [provider]  pantoprazole (PROTONIX) 40 MG tablet Take 40 mg by mouth 2 (two) times daily.   Yes [provider]  promethazine (PHENERGAN) 12.5 MG tablet Take 12.5 mg by mouth every 6 (six) hours as needed for nausea or vomiting.   Yes [provider]  scopolamine (TRANSDERM-SCOP) 1 MG/3DAYS Place 1 patch onto the skin  every 3 (three) days.   Yes [provider]  Zinc Oxide (DESITIN EX) Apply 1 application. topically 2 (two) times daily as needed (irriation).   Yes [provider]      Allergies    Compazine [prochlorperazine], Papaya enzymes, Iron, Morphine and codeine, Zosyn [piperacillin-tazobactam in dex], Ferrous sulfate, Latex, Levofloxacin, Neomycin, and Tape    Review of Systems   Review of Systems  Gastrointestinal:  Positive for abdominal pain.  Musculoskeletal:  Positive for back pain.  All other systems reviewed and are negative.   Physical  Exam Updated Vital Signs BP 121/79   Pulse (!) 103   Temp (!) 96.9 F (36.1 C) (Axillary)   Resp (!) 24   Ht 5\' 5"  (1.651 m)   Wt 52.2 kg   SpO2 100%   BMI 19.14 kg/m  Physical Exam Vitals and nursing note reviewed.  Constitutional:      Appearance: Normal appearance.  HENT:     Head: Normocephalic and atraumatic.     Comments: NG tube still in nose.  A portion is coiled up in mouth.    Right Ear: External ear normal.     Left Ear: External ear normal.     Nose: Nose normal.     Mouth/Throat:     Mouth: Mucous membranes are moist.     Pharynx: Oropharynx is clear.  Eyes:     Extraocular Movements: Extraocular movements intact.     Conjunctiva/sclera: Conjunctivae normal.     Pupils: Pupils are equal, round, and reactive to light.  Cardiovascular:     Rate and Rhythm: Normal rate and regular rhythm.     Pulses: Normal pulses.     Heart sounds: Normal heart sounds.  Pulmonary:     Effort: Pulmonary effort is normal.     Breath sounds: Normal breath sounds.  Abdominal:     General: Abdomen is flat. Bowel sounds are normal.     Palpations: Abdomen is soft.  Musculoskeletal:        General: Normal range of motion.     Cervical back: Normal range of motion and neck supple.  Skin:    General: Skin is warm.     Capillary Refill: Capillary refill takes less than 2 seconds.  Neurological:     General: No focal deficit present.     Mental Status: She is alert and oriented to person, place, and time.  Psychiatric:        Mood and Affect: Mood normal.        Behavior: Behavior normal.     ED Results / Procedures / Treatments   Labs (all labs ordered are listed, but only abnormal results are displayed) Labs Reviewed - No data to display  EKG None  Radiology No results found.  Procedures Procedures    Medications Ordered in ED Medications  HYDROmorphone (DILAUDID) injection 1 mg (1 mg Intravenous Given 12/12/22 1810)  ondansetron (ZOFRAN) injection 4 mg (4 mg  Intravenous Given 12/12/22 1810)  HYDROmorphone (DILAUDID) injection 1 mg (1 mg Intravenous Given 12/12/22 1842)  diazepam (VALIUM) injection 2.5 mg (2.5 mg Intravenous Given 12/12/22 1839)    ED Course/ Medical Decision Making/ A&P                                 Medical Decision Making Amount and/or Complexity of Data Reviewed Radiology: ordered.  Risk Prescription drug management.   This patient presents to  the ED for concern of ng tube out, this involves an extensive number of treatment options, and is a complaint that carries with it a high risk of complications and morbidity.  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   I agree with the radiologist interpretation   Medicines ordered and prescription drug management:  I ordered medication including dilaudid/valium  for pain  Reevaluation of the patient after these medicines showed that the patient improved I have reviewed the patients home medicines and have made adjustments as needed   Test Considered:  xr   Critical Interventions:  ir   Consultations Obtained:  I requested consultation with IR (Dr. Deanne Coffer),  and discussed lab and imaging findings as well as pertinent plan - he was able to place the tube correctly.   Problem List / ED Course:  NG displacement:  I attempted to push it down, but it would not go and she could not tolerate any other intervention.  IR was able to place it  properly   Reevaluation:  After the interventions noted above, I reevaluated the patient and found that they have :improved   Social Determinants of Health:  Lives at home   Dispostion:  After consideration of the diagnostic results and the patients response to treatment, I feel that the patent would benefit from discharge with outpatient f/u.          Final Clinical Impression(s) / ED Diagnoses Final diagnoses:  Encounter for nasogastric (NG) tube placement    Rx / DC Orders ED Discharge Orders     None         Jacalyn Lefevre, MD 12/12/22 2330

## 2022-12-12 NOTE — ED Triage Notes (Signed)
Per [facility] pt coming from restaurant- patient went to restroom and threw up. States she has a core pack tube that came up and that "it is blocking airway unless head positioned forward." Mother at bedside.

## 2022-12-13 DIAGNOSIS — K91850 Pouchitis: Secondary | ICD-10-CM | POA: Diagnosis not present

## 2022-12-13 DIAGNOSIS — R109 Unspecified abdominal pain: Secondary | ICD-10-CM | POA: Diagnosis not present

## 2022-12-13 DIAGNOSIS — Q8789 Other specified congenital malformation syndromes, not elsewhere classified: Secondary | ICD-10-CM | POA: Diagnosis not present

## 2022-12-13 DIAGNOSIS — R1084 Generalized abdominal pain: Secondary | ICD-10-CM | POA: Diagnosis not present

## 2022-12-14 DIAGNOSIS — R109 Unspecified abdominal pain: Secondary | ICD-10-CM | POA: Diagnosis not present

## 2022-12-14 DIAGNOSIS — R1084 Generalized abdominal pain: Secondary | ICD-10-CM | POA: Diagnosis not present

## 2022-12-14 DIAGNOSIS — K91850 Pouchitis: Secondary | ICD-10-CM | POA: Diagnosis not present

## 2022-12-14 DIAGNOSIS — Q8789 Other specified congenital malformation syndromes, not elsewhere classified: Secondary | ICD-10-CM | POA: Diagnosis not present

## 2022-12-15 DIAGNOSIS — R1084 Generalized abdominal pain: Secondary | ICD-10-CM | POA: Diagnosis not present

## 2022-12-15 DIAGNOSIS — Q8789 Other specified congenital malformation syndromes, not elsewhere classified: Secondary | ICD-10-CM | POA: Diagnosis not present

## 2022-12-15 DIAGNOSIS — K91850 Pouchitis: Secondary | ICD-10-CM | POA: Diagnosis not present

## 2022-12-15 DIAGNOSIS — R109 Unspecified abdominal pain: Secondary | ICD-10-CM | POA: Diagnosis not present

## 2022-12-15 NOTE — Unmapped (Signed)
Endocrinology Clinic Follow Up Visit Note    ASSESSMENT AND PLAN:     [name] [name] is a 22 y.o. female with a medical history of [name] , GERD, cataract of right eye, thyroid cyst, desmoid tumors, [name] s/p colectomy in June 2022 with reversed colostomy who I am seeing in follow up for abnormal thyroid labs and high risk of thyroid cancer.     History of abnormal thyroid labs  Patient currently has symptoms of hot flashes which are suspected to be secondary to her new chemotherapy agent.  With normal TSH on labs today do not suspect her symptoms are related to thyroid.  On review of multiple years of past thyroid labs, patient has had intermittent abnormalities in both TSH and free T4.  Her labs have always normalized without treatment.  Suspect this is most likely due to sick euthyroid syndrome.  Although free T4 is outside of the normal range today, this is not clinically significant in the setting of normal TSH.  -Continue to monitor TSH annually or as needed for symptoms.  If TSH is abnormal, would obtain free T4 and free T3.    Increased risk for thyroid cancer  Patients with [name] do have increased risk for thyroid cancer. She does have bilateral colloid cysts, the largest measuring up to 0.4 cm. Last ultrasound was in December 2022.   - Yearly thyroid ultrasound. Overdue for Thyroid US ordered today    Secondary Amenorrhea, resolved  Menarche at age 49 years, her periods were regular for few months after menarche but then has been amenorrheic for several years with one heavy menstrual bleeding in Sep 2021 requiring Dept- provera and Iron infusion.  OCPs were considered but not given out of concerns about desmoid tumor growth under the effect of estrogen. FSH 8.1, LH 11.4, Estradiol 28 in Dec 2022 off any hormonal treatment. Her menometrorrhagia was suspected to be functional hypothalamic secondary to low BMI and long history of low caloric intake.  After having weight gain on TPN, patient had resumption of normal periods.  She reports normal periods for the last 3 months.  She recently completed an egg collection in June 2024.  She did have withdrawal hot flashes after stopping the hormone therapy used for the egg retrieval.  - Continue to follow with Gynecology.     4. Increased risk of pituitary tumor in gardner syndrome  Pituitary MRI which was done in September 2014 was normal.  Brain MRI obtained during recent hospitalization on 10/24/2022 although not pituitary specific did not show gross pituitary/sella abnormality.  Cosyntropin stim test completed during recent hospitalization on 10/15/2022 with normal results making primary adrenal insufficiency or chronic secondary adrenal insufficiency unlikely.  - Continue to monitor clinically      Patient was seen and staffed with Dr. Bryn Gulling MD, PGY-5  Fellow in [facility] and [facility] of [place]        SUBJECTIVE:     [name] [name] is a 22 y.o. female with a medical history of [name] , GERD, cataract of right eye, thyroid cyst, desmoid tumors, [name] s/p colectomy in June 2022 with reversed colostomy who I am seeing in follow up for abnormal thyroid labs and high risk of thyroid cancer.     LV: 04/2021 with Dr. Darius Bump for initial consultation     Patient reports:   -Recent prolonged admission from 09/25/2022-12/10/2022 for outpatient procedure that was complicated by possible reaction to  anesthesia followed by ileus and line infection, initially in acute care then transitioned to rehab. Procedure was steroid injections into tumors in arm to help with hand mobility.   -Been home for 1 week. Not a great week because she does not have her tube feed supplies at home or a pump. ND tube placed by IR into duodenum. Still does not have a tube feeding pump.   -Currently having hot flashes for the last month. Daily hot flashes that start 2-3 hours after nirogacestat. Recently completed egg retrieval in June 2024. Hot flashes started after egg retrieval then resolved. Then restarted after starting nirogacestat.   - Checked cortisol during hospitalization that was normal.     High risk for thyroid cancer:   Brief History: Previously followed by Pediatric Endocrinologist Dr. Karie Mainland who was screening for thyroid cancer with yearly ultrasound as thyroid cancer is a common association with gardner syndrome. She does have colloidal cysts which have been stable. History of abnormal thyroid labs.     Lab Results   Component Value Date    TSH 1.171 12/16/2022    TSH 0.688 10/15/2022    TSH 0.849 09/16/2022    FREET4 0.88 (L) 12/16/2022    FREET4 1.11 10/15/2022    FREET4 1.20 09/16/2022    FREET3 3.30 04/29/2021    FREET3 4.74 12/26/2011    FREET3 4.77 07/15/2011     Thyroid US 02/2021: Bilateral colloid cysts. No suspicious thyroid nodules.    Medication: None currently     Biotin (Vitamin B7): MVI     Symptoms:   - Energy: Low especially due to decreased PO intake   - Tremor: Sometimes   - Palpitations: Tachycardic during hospitalization but now normalized   - Heat Intolerance: Sweating often and hot flashes   - Weight: Lost weight with recent hospitalization   - Bowel Movements: multiple daily loose BM with J pouch     Secondary Amenorrhea:   Brief History: Menarche at age 47, after which she had normal periods for 2-3 months and later stopped for few years. She then had heavy bleeding for 10 days for which she received IV iron and Depot-provera in September 2021 which stopped her bleeding.     Current periods: 3 regular periods since egg retrieval. Resumed periods after TPN with weight gain.   Hormone Therapy: Concern that estrogen might contribute to growth of desmoid tumor. Not currently on any HRT.     Medical History Surgical History   Past Medical History:   Diagnosis Date    Abdominal pain     Acid reflux     occas    Anesthesia complication     itching, shaking, coldness; last few surgeries have gone much better Cancer (CMS-HCC)     Cataract of right eye     COVID-19 virus infection 01/2019    Cyst of thyroid determined by ultrasound     monitoring    [name]     2 right forearm, 1 left thigh, 1 right scapula, 1 under left clavicle; multiple    Difficult intravenous access     [name] (familial adenomatous polyposis)     [name]     Gastric polyps     History of chemotherapy     last treatment approx 05/2019    History of colon polyps     History of COVID-19 01/2019    Iron deficiency anemia due to chronic blood loss     received iron infusion 11-2019  PONV (postoperative nausea and vomiting)     Rectal bleeding     Syncopal episodes     especially if becoming dehydrated      Past Surgical History:   Procedure Laterality Date    COLON SURGERY      cyroablation      cystis removal      desmoid removal      PR CLOSE ENTEROSTOMY,RESEC+ANAST N/A 10/09/2020    Procedure: ILEOSTOMY TAKEDOWN;  Surgeon: Mickle Asper, MD;  Location: OR [place];  Service: General Surgery    PR COLONOSCOPY W/BIOPSY SINGLE/MULTIPLE N/A 10/27/2012    Procedure: COLONOSCOPY, FLEXIBLE, PROXIMAL TO SPLENIC FLEXURE; WITH BIOPSY, SINGLE OR MULTIPLE;  Surgeon: Shirlyn Goltz Mir, MD;  Location: PEDS PROCEDURE ROOM [facility];  Service: Gastroenterology    PR COLONOSCOPY W/BIOPSY SINGLE/MULTIPLE N/A 09/14/2013    Procedure: COLONOSCOPY, FLEXIBLE, PROXIMAL TO SPLENIC FLEXURE; WITH BIOPSY, SINGLE OR MULTIPLE;  Surgeon: Shirlyn Goltz Mir, MD;  Location: PEDS PROCEDURE ROOM [place];  Service: Gastroenterology    PR COLONOSCOPY W/BIOPSY SINGLE/MULTIPLE N/A 11/08/2014    Procedure: COLONOSCOPY, FLEXIBLE, PROXIMAL TO SPLENIC FLEXURE; WITH BIOPSY, SINGLE OR MULTIPLE;  Surgeon: Arnold Long Mir, MD;  Location: PEDS PROCEDURE ROOM [facility]  Service: Gastroenterology    PR COLONOSCOPY W/BIOPSY SINGLE/MULTIPLE N/A 12/26/2015    Procedure: COLONOSCOPY, FLEXIBLE, PROXIMAL TO SPLENIC FLEXURE; WITH BIOPSY, SINGLE OR MULTIPLE;  Surgeon: Arnold Long Mir, MD;  Location: PEDS PROCEDURE ROOM [facility];  Service: Gastroenterology    PR COLONOSCOPY W/BIOPSY SINGLE/MULTIPLE N/A 09/02/2017    Procedure: COLONOSCOPY, FLEXIBLE, PROXIMAL TO SPLENIC FLEXURE; WITH BIOPSY, SINGLE OR MULTIPLE;  Surgeon: Arnold Long Mir, MD;  Location: PEDS PROCEDURE ROOM [place];  Service: Gastroenterology    PR COLSC FLX W/REMOVAL LESION BY HOT BX FORCEPS N/A 08/27/2016    Procedure: COLONOSCOPY, FLEXIBLE, PROXIMAL TO SPLENIC FLEXURE; W/REMOVAL TUMOR/POLYP/OTHER LESION, HOT BX FORCEP/CAUTE;  Surgeon: Arnold Long Mir, MD;  Location: PEDS PROCEDURE ROOM [facility];  Service: Gastroenterology    PR COLSC FLX W/RMVL OF TUMOR POLYP LESION SNARE TQ N/A 02/25/2019    Procedure: COLONOSCOPY FLEX; W/REMOV TUMOR/LES BY SNARE;  Surgeon: Helyn Numbers, MD;  Location: [facility] PROCEDURES [place] [facility];  Service: Gastroenterology    PR COLSC FLX W/RMVL OF TUMOR POLYP LESION SNARE TQ N/A 03/13/2020    Procedure: COLONOSCOPY FLEX; W/REMOV TUMOR/LES BY SNARE;  Surgeon: Helyn Numbers, MD;  Location: [facility] PROCEDURES [place] [facility];  Service: Gastroenterology    PR EXC SKIN BENIG 2.1-3 CM TRUNK,ARM,LEG Right 02/25/2017    Procedure: EXCISION, BENIGN LESION INCLUDE MARGINS, EXCEPT SKIN TAG, LEGS; EXCISED DIAMETER 2.1 TO 3.0 CM;  Surgeon: Clarene Duke, MD;  Location: CHILDRENS OR [facility];  Service: Plastics    PR EXC SKIN BENIG 3.1-4 CM TRUNK,ARM,LEG Right 02/25/2017    Procedure: EXCISION, BENIGN LESION INCLUDE MARGINS, EXCEPT SKIN TAG, ARMS; EXCISED DIAMETER 3.1 TO 4.0 CM;  Surgeon: Clarene Duke, MD;  Location: CHILDRENS OR [facility];  Service: Plastics    PR EXC SKIN BENIG >4 CM FACE,FACIAL Right 02/25/2017    Procedure: EXCISION, OTHER BENIGN LES INCLUD MARGIN, FACE/EARS/EYELIDS/NOSE/LIPS/MUCOUS MEMBRANE; EXCISED DIAM >4.0 CM;  Surgeon: Clarene Duke, MD;  Location: CHILDRENS OR [facility];  Service: Plastics    PR EXC TUMOR SOFT TISSUE LEG/ANKLE SUBQ 3+CM Right 08/05/2019    Procedure: EXCISION, TUMOR, SOFT TISSUE OF LEG OR ANKLE AREA, SUBCUTANEOUS; 3 CM OR GREATER;  Surgeon: Arsenio Katz, MD;  Location: MAIN OR [place];  Service: Plastics    PR EXC TUMOR SOFT TISSUE LEG/ANKLE SUBQ <  3CM Right 08/05/2019    Procedure: EXCISION, TUMOR, SOFT TISSUE OF LEG OR ANKLE AREA, SUBCUTANEOUS; LESS THAN 3 CM;  Surgeon: Arsenio Katz, MD;  Location: MAIN OR [facility];  Service: Plastics    PR LAP, SURG PROCTECTOMY W J-POUCH N/A 08/10/2020    Procedure: ROBOTIC ASSISTED LAPAROSCOPIC PROCTOCOLECTOMY, ILEAL J POUCH, WITH OSTOMY;  Surgeon: Mickle Asper, MD;  Location: OR [place];  Service: General Surgery    PR NDSC EVAL INTSTINAL POUCH DX W/COLLJ SPEC SPX N/A 01/23/2021    Procedure: ENDO EVAL SM INTEST POUCH; DX;  Surgeon: Modena Nunnery, MD;  Location: [facility] PROCED[facility] [place] [facility];  Service: Gastroenterology    PR NDSC EVAL INTSTINAL POUCH DX W/COLLJ SPEC SPX N/A 08/27/2021    Procedure: ENDO EVAL SM INTEST POUCH; DX;  Surgeon: Hunt Oris, MD;  Location: [facility]URES [facility];  Service: Gastroenterology    PR NDSC EVAL INTSTINAL POUCH DX W/COLLJ SPEC SPX N/A 12/09/2021    Procedure: ENDO EVAL SM INTEST POUCH; DX;  Surgeon: Vidal Schwalbe, MD;  Location: GI PROCEDURES [facility];  Service: Gastroenterology    PR NDSC EVAL INTSTINAL POUCH DX W/COLLJ [facility] SPX Left 04/09/2022    Procedure: ENDO EVAL SM INTEST POUCH; DX;  Surgeon: Modena Nunnery, MD;  [facility] [facility];  Service: Gastroenterology    PR NDSC EVAL INTSTINAL POUCH DX W/COLLJ SPEC SPX N/A 08/05/2022    Procedure: ENDO EVAL SM INTEST POUCH; DX;  Surgeon: Modena Nunnery, MD;  Location: GI PROCEDURES [facility];  Service: Gastroenterology    PR NDSC EVAL INTSTINAL POUCH W/BX SINGLE/MULTIPLE N/A 01/20/2022    Procedure: ENDOSCOPIC EVAL OF SMALL INTESTINAL POUCH; DIAGNOSTIC, No biopsies;  Surgeon: Andrey Farmer, MD;  Location: GI PROCEDURES [facility];  Service: Gastroenterology    PR NDSC EVAL INTSTINAL POUCH W/BX SINGLE/MULTIPLE N/A 02/13/2022    Procedure: ENDOSCOPIC EVAL OF SMALL INTESTINAL POUCH; DIAGNOSTIC, WITH BIOPSY;  Surgeon: Bronson Curb, MD;  Location: GI PROCEDURES [facility];  Service: Gastroenterology    PR UNLISTED PROCEDURE SMALL INTESTINE  01/23/2021    Procedure: UNLISTED PROCEDURE, SMALL INTESTINE;  Surgeon: Modena Nunnery, MD;  Location: [facility] [place] [facility];  Service: Gastroenterology    PR UNLISTED PROCEDURE SMALL INTESTINE  02/13/2022    Procedure: UNLISTED PROCEDURE, SMALL INTESTINE;  Surgeon: Bronson Curb, MD;  Location: GI PROCEDURES [facility];  Service: Gastroenterology    PR UPPER GI ENDOSCOPY,BIOPSY N/A 10/27/2012    Procedure: UGI ENDOSCOPY; WITH BIOPSY, SINGLE OR MULTIPLE;  Surgeon: Shirlyn Goltz Mir, MD;  Location: PEDS PROCEDURE ROOM [facility];  Service: Gastroenterology    PR UPPER GI ENDOSCOPY,BIOPSY N/A 09/14/2013    Procedure: UGI ENDOSCOPY; WITH BIOPSY, SINGLE OR MULTIPLE;  Surgeon: Shirlyn Goltz Mir, MD;  Location: PEDS PROCEDURE ROOM [facility];  Service: Gastroenterology    PR UPPER GI ENDOSCOPY,BIOPSY N/A 11/08/2014    Procedure: UGI ENDOSCOPY; WITH BIOPSY, SINGLE OR MULTIPLE;  Surgeon: Arnold Long Mir, MD;  Location: PEDS PROCEDURE ROOM [facility];  Service: Gastroenterology    PR UPPER GI ENDOSCOPY,BIOPSY N/A 12/26/2015    Procedure: UGI ENDOSCOPY; WITH BIOPSY, SINGLE OR MULTIPLE;  Surgeon: Arnold Long Mir, MD;  Location: PEDS PROCEDURE ROOM [facility];  Service: Gastroenterology    PR UPPER GI ENDOSCOPY,BIOPSY N/A 08/27/2016    Procedure: UGI ENDOSCOPY; WITH BIOPSY, SINGLE OR MULTIPLE;  Surgeon: Arnold Long Mir, MD;  Location: PEDS PROCEDURE ROOM [facility];  Service: Gastroenterology    PR UPPER GI ENDOSCOPY,BIOPSY N/A 09/02/2017  Procedure: UGI ENDOSCOPY; WITH BIOPSY, SINGLE OR MULTIPLE;  Surgeon: Arnold Long Mir, MD;  Location: PEDS PROCEDURE ROOM [facility];  Service: Gastroenterology    PR UPPER GI ENDOSCOPY,BIOPSY N/A 03/13/2020    Procedure: UGI ENDOSCOPY; WITH BIOPSY, SINGLE OR MULTIPLE;  Surgeon: Helyn Numbers, MD;  Location: [facility] [facility];  Service: Gastroenterology    PR UPPER GI ENDOSCOPY,BIOPSY N/A 09/05/2021    Procedure: UGI ENDOSCOPY; WITH BIOPSY, SINGLE OR MULTIPLE;  Surgeon: Wendall Papa, MD;  Location: GI PROCEDURES [facility];  Service: Gastroenterology    PR UPPER GI ENDOSCOPY,DIAGNOSIS N/A 01/20/2022    Procedure: UGI ENDO, INCLUDE ESOPHAGUS, STOMACH, & DUODENUM &/OR JEJUNUM; DX W/WO COLLECTION SPECIMN, BY BRUSH OR WASH;  Surgeon: Andrey Farmer, MD;  Location: GI PROCEDURES [facility];  Service: Gastroenterology    TUMOR REMOVAL      multiple-head, neck, back, hand, right flank, multiple          Social History Family History   Social History     Tobacco Use    Smoking status: Never     Passive exposure: Past    Smokeless tobacco: Never   Substance Use Topics    Alcohol use: Never      Family History   Problem Relation Age of Onset    No Known Problems Mother     No Known Problems Father     No Known Problems Sister     No Known Problems Brother     Stroke Maternal Grandmother     Other Maternal Grandmother         benign lesions of liver and pancreas, further details unknown    Cancer Maternal Grandmother     Diabetes Maternal Grandmother     Hypertension Maternal Grandmother     Thyroid disease Maternal Grandmother     Arthritis Maternal Grandfather     Asthma Maternal Grandfather     COPD Paternal Grandmother         Deceased    Miscarriages / Stillbirths Paternal Grandmother     Alcohol abuse Paternal Grandfather         Deceased    No Known Problems Maternal Aunt     No Known Problems Maternal Uncle     No Known Problems Paternal Aunt     No Known Problems Paternal Uncle     Anesthesia problems Neg Hx     Broken bones Neg Hx     Cancer Neg Hx     Clotting disorder Neg Hx     Collagen disease Neg Hx     Diabetes Neg Hx     Dislocations Neg Hx     Fibromyalgia Neg Hx     Gout Neg Hx     Hemophilia Neg Hx     Osteoporosis Neg Hx     Rheumatologic disease Neg Hx     Scoliosis Neg Hx     Severe sprains Neg Hx     Sickle cell anemia Neg Hx     Spinal Compression Fracture Neg Hx     Melanoma Neg Hx     Basal cell carcinoma Neg Hx     Squamous cell carcinoma Neg Hx           Medications     Current Outpatient Medications:     acetaminophen (TYLENOL) 500 MG tablet, Take 2 tablets (1,000 mg total) by mouth every eight (8) hours., Disp: , Rfl:     ammonium lactate (LAC-HYDRIN) 12 % lotion,  Apply 1 Application topically two (2) times a day., Disp: 226 g, Rfl: 0    baclofen (LIORESAL) 10 MG tablet, Take 1 tablet (10 mg total) by mouth Three (3) times a day as needed for muscle spasms., Disp: 90 tablet, Rfl: 0    cefdinir (OMNICEF) 300 MG capsule, Take 1 capsule (300mg ) twice a day on Mon, Wed, Friday, Disp: , Rfl:     cholecalciferol, vitamin D3 25 mcg, 1,000 units,, 1,000 unit (25 mcg) tablet, Take 1 tablet (25 mcg total) by mouth daily., Disp: 100 tablet, Rfl: 3    clobetasoL (TEMOVATE) 0.05 % ointment, Apply topically two (2) times a day. To stubborn/thick rashes on hands/feet ears until clear/smooth. Restart as needed, Disp: 60 g, Rfl: 3    COURIERED MED OR SUPPLY, OGSVEO 50 MG TABLET., Disp: 1 each, Rfl: 0    doxycycline (VIBRA-TABS) 100 MG tablet, TAKE 1 TABLETS BY MOUTH TWICE A DAY ON TUESDAY, THURSDAY, SATURDAY, SUNDAY., Disp: 28 tablet, Rfl: 0    famotidine (PEPCID) 20 MG tablet, Take 1 tablet (20 mg total) by mouth nightly., Disp: 30 tablet, Rfl: 0    hydrocortisone 2.5 % cream, Apply topically two (2) times a day as needed (hemorrhoids)., Disp: 30 g, Rfl: 0    lidocaine (ASPERCREME) 4 % patch, Place 2 patches on the skin daily., Disp: 60 patch, Rfl: 0    LORazepam (ATIVAN) 0.5 MG tablet, Take 2 tablets (1 mg total) by mouth Three (3) times a day as needed for anxiety., Disp: , Rfl:     mirtazapine (REMERON) 15 MG tablet, Take 1 tablet (15 mg total) by mouth nightly., Disp: 90 tablet, Rfl: 3    multivitamin with folic acid 400 mcg Tab tablet, Take 1 tablet by mouth daily., Disp: 30 tablet, Rfl: 0    naloxone (NARCAN) 4 mg nasal spray, One spray in either nostril once for known/suspected opioid overdose. May repeat every 2-3 minutes in alternating nostril til EMS arrives, Disp: 2 each, Rfl: 0    nirogacestat (OGSIVEO) 50 mg tablet, Take 3 tablets (150 mg total) by mouth two (2) times a day. Swallow tablets whole; do not break, crush, or chew., Disp: 180 tablet, Rfl: 5    pantoprazole (PROTONIX) 40 MG tablet, Take 1 tablet (40 mg total) by mouth two (2) times a day., Disp: 60 tablet, Rfl: 0    pimecrolimus (ELIDEL) 1 % cream, Apply topically two (2) times a day as needed. To face as needed for rash, Disp: , Rfl:     promethazine (PHENERGAN) 12.5 MG tablet, Take 1 tablet (12.5 mg total) by mouth every six (6) hours as needed., Disp: 120 tablet, Rfl: 0    scopolamine (TRANSDERM-SCOP) 1 mg over 3 days, Place 1 patch (1 mg total) on the skin every third day., Disp: 10 patch, Rfl: 0    triamcinolone (KENALOG) 0.1 % cream, Apply topically two (2) times a day as needed. Apply to rash as needed, Disp: , Rfl:     zinc oxide-cod liver oil (DESITIN 40%) 40 % Pste, Apply topically daily as needed., Disp: 57 g, Rfl: 0         Allergies   Allergies   Allergen Reactions    Adhesive Tape-Silicones Hives and Rash     Paper tape and Tegederm ok. Biopatch causes blistering but has tolerated CHG Tegaderm    Ferrlecit [Sodium Ferric Gluconat-Sucrose] Swelling and Rash    Levofloxacin Swelling and Rash     Swelling in mouth, rash,  Methylnaltrexone      Per Patient: I lost bowel control, severe abdominal cramping, and elevated BP    Neomycin Swelling     Rxn after ear drops; ear swelling    Papaya Hives    Morphine Nausea And Vomiting    Zosyn [Piperacillin-Tazobactam] Itching and Rash     Red and itchy    Compazine [Prochlorperazine] Other (See Comments)     Extreme agitation    Iron Analogues     Iron Dextran Itching     Received iron dextran 06/08/22 over 12 hours, had itching and redness/flushing during the infusion and for a couple days after. Required IV benadryl w/flares between doses and ultimately treated w/IV methylpred for 2 days.     Latex, Natural Rubber Rash          Review of Systems: Negative except per HPI     OBJECTIVE:     Physical Exam:  BP 103/73  - Pulse 85  - Temp 36.4 ??C (97.5 ??F) (Skin)  - Wt 58.5 kg (129 lb) Comment: per patient - BMI 21.47 kg/m??     Physical Exam  Vitals reviewed.   Constitutional:       General: She is not in acute distress.     Appearance: She is ill-appearing (chronically).      Comments: In wheelchair    HENT:      Nose:      Comments: ND tube in place  Cardiovascular:      Rate and Rhythm: Normal rate.   Pulmonary:      Effort: No respiratory distress.   Skin:     General: Skin is warm and dry.   Neurological:      General: No focal deficit present.      Mental Status: She is alert and oriented to person, place, and time.   Psychiatric:         Mood and Affect: Mood normal.         Behavior: Behavior normal.         Labs:    Lab Results   Component Value Date    TRIG 98 10/27/2022    CHOL 147 06/06/2022    HDL 48 06/06/2022    LDL 79 06/06/2022     Lab Results   Component Value Date    TSH 1.171 12/16/2022    FREET4 0.88 (L) 12/16/2022    FREET3 3.30 04/29/2021     Lab Results   Component Value Date    CREATININE 0.52 (L) 12/16/2022    GFRNAAF >90 06/10/2019     Lab Results   Component Value Date    NA 139 12/16/2022    K 3.4 12/16/2022    CL 104 12/16/2022    CO2 26.8 12/16/2022    BUN 6 (L) 12/16/2022    CREATININE 0.52 (L) 12/16/2022    GFR UNABLE TO CALCULATE GFR.BASED ON PATIENT AGE 58/09/2011    AST 13 12/16/2022    PROT 7.7 12/16/2022    ALBUMIN 4.0 12/16/2022     Lab Results   Component Value Date    FSH 8.1 02/13/2021    LH 11.4 02/13/2021       Lab Results   Component Value Date    PROLACTIN 5.2 02/13/2021       Lab Results   Component Value Date    ALBUMIN 4.0 12/16/2022    ALBUMIN 3.3 (L) 12/08/2022    ALBUMIN 3.2 (L) 12/04/2022     Lab  Results   Component Value Date    PHOS 3.9 12/08/2022    PHOS 3.8 12/04/2022    PHOS 3.5 12/01/2022     Imaging:    DATA REVIEW  Labs: I personally reviewed lab work at this visit.     Radiology: I personally reviewed and interpreted imaging studies at this visit. Thyroid normal in size with no worrisome nodules.     Tyroid [name] 2022   Impression   -- Bilateral colloid cysts. No suspicious thyroid nodules.      _______________________________   Allene Pyo 1 (0 points): Benign- No FNA indication   TI-RADS 2 (2 points): Not suspicious- No FNA indicated   TI-RADS 3 (3 points): Mildly suspicious- FNA is > or = 2.5 cm, follow if > or = 1.5 cm   TI-RADS 4 (4-6 points): Moderately suspicious- FNA if > or = 1.5 or follow if > or = 1.0 cm   TI-RADS 5 (7 or more points): Highly suspicious- FNA if >=1.0 cm, follow if >=0.5 cm   NOTE:  The TI-RADS classification of thyroid nodules has been adopted to standardize risk stratification based on a common lexicon to inform practitioners about which nodules warrant biopsy.  The imaging criteria for TI-RADS criteria and documentation are available online at https://www.arnold.com/      Please see below for data measurements:      Right thyroid: Sagittal 4.4 cm; AP 1.4 cm; Transverse 1.6 cm   Left thyroid: Sagittal 4.4 cm; AP 1.1 cm; Transverse 1.7 cm      Isthmus: 0.20 cm     Narrative   EXAM: US THYROID   DATE: 02/13/2021 2:06 PM   ACCESSION: [id number]   DICTATED: 02/13/2021 1:20 PM   INTERPRETATION LOCATION: [facility]      CLINICAL INDICATION: 22 years old Female with thyroid nodule (possible)  - Q87.89 - [name]      COMPARISON: 10/05/2018      TECHNIQUE:  Ultrasound views of the thyroid were obtained using gray scale and limited color Doppler imaging.      FINDINGS:   Thyroid size: see below      Thyroid echotexture: Mildly heterogeneous without suspicious nodule identified.  Bilateral colloid cysts, the largest measuring up to 0.4 cm bilaterally.        MRI Brain 11/15/2012   Narrative   EXAM INFO: [id number] UN 11/15/12  18:03:38 [id number] [facility]) : MRI BRAIN W WO CONTRAST      EXAM: Magnetic resonance imaging, brain without contrast material, followed by contrast material and further sequences.      DICTATED: 11/16/12 08:12:12      CLINICAL INDICATION: 22 year old F.  History of [name] and secondary hypopituitarism. Evaluate for mass.      COMPARISON: None available.      TECHNIQUE: Multiplanar, multisequence MR imaging of the brain was performed without and with I.V. contrast.  Imaging of the pituitary gland including dynamic and thin section coronal and sagittal images was also performed.      FINDINGS: No focal parenchymal signal abnormality is identified.  There is no midline shift or mass lesion.  There is no evidence of intracranial hemorrhage or infarct.  There are no extra-axial fluid collections present.  No diffusion weighted signal   abnormality is identified.      The pituitary stalk is midline.  There is no enlargement of the pituitary gland or sella.  There is no intrasellar or suprasellar mass.  The optic chiasm is normal.  Normal flow  voids are seen within the visualized portions of the internal carotid   arteries.      Postcontrast imaging shows no abnormal areas of contrast enhancement within the pituitary gland.  No abnormal enhancement is noted elsewhere in the brain.         INTERPRETATION LOCATION:  [facility]      IMPRESSION:     Normal pre- and post-contrast MRI of the pituitary gland and brain.     MRI Brain 10/24/2022   Impression   --Normal MRI of the brain.        Narrative   EXAM: Magnetic resonance imaging, brain, without and with contrast material.   ACCESSION: [id number] UN      CLINICAL INDICATION: 22 years old Female with Increasing rigidity/tone and weakness, rule out central process        COMPARISON: MRI brain 09/17/2016      TECHNIQUE: Multiplanar, multisequence MR imaging of the brain was performed without and with I.V. contrast.      FINDINGS:     There is no focal parenchymal signal abnormality. Ventricles are normal in size. There is no midline shift. No extra-axial fluid collection. No evidence of intracranial hemorrhage. No diffusion weighted signal abnormality to suggest acute infarct. Developmental venous anomaly in the right frontal centrum semiovale.      No mass. There is no abnormal intracranial enhancement. No abnormal enhancement in the left parotid gland on today's exam.       Other medical data: I reviewed and summarized the records in preparation for today's visit, all pertinent notes in Epic/Media and CareEverywhere as well as any sent records.

## 2022-12-15 NOTE — Unmapped (Signed)
Call attempt number 1 was made to reach pt, in regards to scheduling appointments for [facility].    The call resulted in: Message left    Please schedule the patient upon their return call on next available for the following:     Lab Appointment and Provider    Thank you,    Perley Jain

## 2022-12-16 ENCOUNTER — Ambulatory Visit: Admit: 2022-12-16 | Discharge: 2022-12-16 | Payer: PRIVATE HEALTH INSURANCE

## 2022-12-16 ENCOUNTER — Ambulatory Visit
Admit: 2022-12-16 | Discharge: 2022-12-16 | Payer: PRIVATE HEALTH INSURANCE | Attending: Student in an Organized Health Care Education/Training Program | Primary: Student in an Organized Health Care Education/Training Program

## 2022-12-16 DIAGNOSIS — D48119 Desmoid tumor of unspecified site: Secondary | ICD-10-CM | POA: Diagnosis not present

## 2022-12-16 DIAGNOSIS — Z79899 Other long term (current) drug therapy: Principal | ICD-10-CM

## 2022-12-16 DIAGNOSIS — E0590 Thyrotoxicosis, unspecified without thyrotoxic crisis or storm: Principal | ICD-10-CM

## 2022-12-16 DIAGNOSIS — Q8789 Other specified congenital malformation syndromes, not elsewhere classified: Principal | ICD-10-CM

## 2022-12-16 DIAGNOSIS — N911 Secondary amenorrhea: Principal | ICD-10-CM

## 2022-12-16 DIAGNOSIS — K91850 Pouchitis: Secondary | ICD-10-CM | POA: Diagnosis not present

## 2022-12-16 DIAGNOSIS — R109 Unspecified abdominal pain: Secondary | ICD-10-CM | POA: Diagnosis not present

## 2022-12-16 DIAGNOSIS — I82A11 Acute embolism and thrombosis of right axillary vein: Secondary | ICD-10-CM | POA: Diagnosis not present

## 2022-12-16 DIAGNOSIS — K56609 Unspecified intestinal obstruction, unspecified as to partial versus complete obstruction: Secondary | ICD-10-CM | POA: Diagnosis not present

## 2022-12-16 DIAGNOSIS — R1084 Generalized abdominal pain: Secondary | ICD-10-CM | POA: Diagnosis not present

## 2022-12-16 LAB — CBC W/ AUTO DIFF
BASOPHILS ABSOLUTE COUNT: 0.1 10*9/L (ref 0.0–0.1)
BASOPHILS RELATIVE PERCENT: 0.6 %
EOSINOPHILS ABSOLUTE COUNT: 0.4 10*9/L (ref 0.0–0.5)
EOSINOPHILS RELATIVE PERCENT: 4 %
HEMATOCRIT: 39.7 % (ref 34.0–44.0)
HEMOGLOBIN: 13.3 g/dL (ref 11.3–14.9)
LYMPHOCYTES ABSOLUTE COUNT: 2.4 10*9/L (ref 1.1–3.6)
LYMPHOCYTES RELATIVE PERCENT: 24.5 %
MEAN CORPUSCULAR HEMOGLOBIN CONC: 33.4 g/dL (ref 32.0–36.0)
MEAN CORPUSCULAR HEMOGLOBIN: 26.9 pg (ref 25.9–32.4)
MEAN CORPUSCULAR VOLUME: 80.7 fL (ref 77.6–95.7)
MEAN PLATELET VOLUME: 9.5 fL (ref 6.8–10.7)
MONOCYTES ABSOLUTE COUNT: 0.6 10*9/L (ref 0.3–0.8)
MONOCYTES RELATIVE PERCENT: 6.2 %
NEUTROPHILS ABSOLUTE COUNT: 6.3 10*9/L (ref 1.8–7.8)
NEUTROPHILS RELATIVE PERCENT: 64.7 %
PLATELET COUNT: 287 10*9/L (ref 150–450)
RED BLOOD CELL COUNT: 4.92 10*12/L (ref 3.95–5.13)
RED CELL DISTRIBUTION WIDTH: 15.2 % (ref 12.2–15.2)
WBC ADJUSTED: 9.8 10*9/L (ref 3.6–11.2)

## 2022-12-16 LAB — COMPREHENSIVE METABOLIC PANEL
ALBUMIN: 4 g/dL (ref 3.4–5.0)
ALKALINE PHOSPHATASE: 82 U/L (ref 46–116)
ALT (SGPT): 8 U/L — ABNORMAL LOW (ref 10–49)
ANION GAP: 8 mmol/L (ref 5–14)
AST (SGOT): 13 U/L (ref <=34)
BILIRUBIN TOTAL: 0.2 mg/dL — ABNORMAL LOW (ref 0.3–1.2)
BLOOD UREA NITROGEN: 6 mg/dL — ABNORMAL LOW (ref 9–23)
BUN / CREAT RATIO: 12
CALCIUM: 9.7 mg/dL (ref 8.7–10.4)
CHLORIDE: 104 mmol/L (ref 98–107)
CO2: 26.8 mmol/L (ref 20.0–31.0)
CREATININE: 0.52 mg/dL — ABNORMAL LOW
EGFR CKD-EPI (2021) FEMALE: >90 mL/min/{1.73_m2} (ref >=60)
GLUCOSE RANDOM: 122 mg/dL (ref 70–179)
POTASSIUM: 3.4 mmol/L (ref 3.4–4.8)
PROTEIN TOTAL: 7.7 g/dL (ref 5.7–8.2)
SODIUM: 139 mmol/L (ref 135–145)

## 2022-12-16 LAB — T4, FREE: FREE T4: 0.88 ng/dL — ABNORMAL LOW (ref 0.89–1.76)

## 2022-12-16 LAB — TSH: THYROID STIMULATING HORMONE: 1.171 u[IU]/mL (ref 0.550–4.780)

## 2022-12-16 LAB — MAGNESIUM: MAGNESIUM: 2.2 mg/dL (ref 1.6–2.6)

## 2022-12-16 MED ORDER — ESCITALOPRAM 5 MG TABLET
ORAL_TABLET | Freq: Every day | ORAL | 2 refills | 30 days | Status: CP
Start: 2022-12-16 — End: 2023-12-16

## 2022-12-16 MED ORDER — DIAZEPAM 5 MG TABLET
ORAL_TABLET | Freq: Two times a day (BID) | ORAL | 0 refills | 30 days | Status: CP
Start: 2022-12-16 — End: 2023-01-15

## 2022-12-16 MED ORDER — NALOXEGOL 12.5 MG TABLET
ORAL_TABLET | Freq: Every day | ORAL | 0 refills | 30 days | Status: CP
Start: 2022-12-16 — End: ?

## 2022-12-16 NOTE — Unmapped (Signed)
[facility] BONE AND SOFT TISSUE ONCOLOGY RETURN VISIT    Encounter Date: 12/16/2022  Patient Name: [name] [name]  Medical Record Number: [id number]    Referring Physician: Referring, None Per Patient  [address] 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    Desmoid fibromatosis, history of [name]     She has had numerous sites of disease, including paraspinal, chest wall, right arm/wrist. She has had several cryoablations with Dr. Cathie Hoops which have seemed to offer clinical benefit. She trialed sorafenib which seemed to lead to shrinkage of lesions, but she had significant intolerance with dizziness, presyncope, fatigue, hair loss, rash, GI side effects.      We have discussed at length the nature of desmoid fibromatosis tumors, the fact they can wax and wane independent of therapy, are not cancers in a metastatic potential, but can be locally infiltrative and cause substantial morbidity to the site of disease and occasionally mortality. I reviewed that the emerging standard of care is observation based on the fact that 20-30% will experience spontaneous regression in the year following diagnosis. Given her long experience with desmoids, complete regression may be less likely, but we will still approach this cautiously with parallel goals of avoidance of overtreatment (particularly surgical) and palliation of symptoms / preservation of function.     At first visit in 2022, her lesions are in the shoulder, right forearm, chest area, with a possible desmoid in her left thigh. Symptoms are manageable at the moment, after recent cryoablations and steroid injection to the forearm.    06/21/21: Has had a complex few months, on TPN, significant fluid overload, caused worsening desmoid pain. Now off TPN, nutrition slowly improving, tumor pain better. Scans show 2 ill defined soft tissue nodules, concerning for recurrent desmoid. Start with surveillance, given risks of systemic therapy after recent significant GI and medical issues.     09/03/21: Scans show stable disease. Was admitted for abdominal pain, concern for intussusception, N/V, but we discussed that these may not be related to her desmoids. Opted for continued surveillance.    12/03/21: Scans show slow growth when reflecting over the past several months. Importantly, she reports substantially worsening abdominal pain which is centered on her desmoids, poor appetite, weight loss, possible transient obstructive symptoms. Long conversation about options and pros and cons of each. She would like to trial sorafenib again, acknowledging the risks. She will meet with GI, her psychiatry team (she has had challenges with duloxetine), pain team, and may reconnect with Dr. Selena Batten in the interim to discuss other supportive care measures and procedural options.     9/28-10/13/23: Hospitalization for abdominal pain, malnutrition. Pouchoscopy 10/4 without stricture. Initiated on tube feeds. Sorafenib was initiated ~ 10/3 and tolerated reasonably well in house.    12/31/21: Tox check. Sorafenib 200mg  daily, but a variety of ongoing issues. Diarrhea (which may be drug related), ongoing abdominal pain (perhaps partially desmoid related but not exclusively), intolerance of PO diet and even tube feeds (driven by pain / cramping), rash (drug related), anxiety.     01/10/22-01/22/22: 2-week hospitalization, due to uncontrolled pain in the setting of recent celiac plexus block, complicated by GI bleeding in setting of anticoagulation due to line associated DVT. She subsequently required multiple iron infusions, and was discharged on eliquis  2.5mg  BID.  During the hospitalization was noted to be persistently tachycardic, likely multifactorial etiology.  Additionally during her hospital admission was restarted on TPN in consultation with her GI team.  Patient was discharged on 01/22/2022 on TPN.      12/6-12/13/23: Another hospitalization, for GI bleed in setting of sorafenib and anticoagulation. Pain control was difficult, underwent endoscopy to evaluate for pouchitis.    02/28/22: Continued challenges since hospital stay. Reviewed several key factors: clear growth of desmoid tumors in Sept, prior to initiation of sorafenib. Clear response/shrinkage of desmoid tumors on Dec 10 scans, indicating treatment response to 200mg  sorafenib. Pouch biopsy revealed pouchitis, but this was at the site of an ulcer, thus not unexpected. Depression remains a big challenge, will coordinate with psychiatry. Resume sorafenib 200mg . Reviewed risk of re-bleeding, but this is reduced in the setting of discontinuation of anticoagulation.    1/1-1/18/24: Hospitalization for pain, recurrent GI bleeding. Stopped sorafenib. SSRI trialed but recurrent bleeding thus held as well. Efforts at weaning opioids. Significant depression.    06/06/22: Plan to initiate nirogacestat pending fertility conversation (scheduled with patient for April 11).  Bleeding improved with TXA.  Patient with 1 week of significant pain.  Admit for pain control and re-imaging with CT abdomen pelvis to r/o ileus. CT with stable desmoids, no ileus. IV iron given.    09/16/22: Start nirogacestat.     09/25/22: Admitted with uncontrolled pain and muscle spasms after IR guided procedure. Prolonged hospital stay, neurologic evaluation, managed with muscle relaxers (baclofen, valium). Discharge 9/11 to rehab. Ramped up on nirogacestat with some side effects, discharged from rehab 10/2.    12/16/22:  Assessment & Plan  Desmoid Fibromatosis  On nirogacestat for treatment. Tolerating reasonably well.  -Repeat scans in ~ 3 mos    Back Pain  Noted back pain and swelling, which has improved. CT and ultrasound performed, but no MRI.  -Consider MRI for further evaluation if pain persists.    Edema  Noted pitting edema in legs and feet, improved with IV Lasix. Patient reports not responding well to oral Lasix.  -Continue monitoring for edema.  -Consider further diuretic management if edema recurs.    Hot Flashes  Patient experiencing significant hot flashes, causing nausea and discomfort.  -Start Lexapro 5mg  daily in the morning for hot flash management.  -Track progress and adjust dosage as needed.    Nausea  Significant nausea reported, possibly related to hot flashes and lack of food intake.  -Resume mirtazapine 7.5mg  at night for nausea and appetite stimulation.  -Start Movantik for nausea/constipation management.  -Monitor for improvement or worsening of symptoms.    Muscle Spasticity  Managed with diazepam, previously on Baclofen.  -Split diazepam dose to 2.5mg  twice daily.  -Consider weaning off benzodiazepines in the future.    Pain Management  On Suboxone 1mg  three times daily and Oxycodone 10-15mg  as needed.  -Continue current pain management regimen.  -Monitor for effectiveness and side effects.    Nutrition  Patient has lost significant weight and has difficulty eating.  -Encourage continued efforts to eat.  -Monitor weight and nutritional status closely.    Oral Health  Noted changes in taste, mouth sores, and dry, cracked lips.  -Continue use of biotin spray and mouthwash.  -Consider further evaluation if symptoms persist.    Thyroid Nodules  History of thyroid nodules, monitored for potential thyroid cancer.  -Continue regular thyroid monitoring as per previous pediatrician's plan.    Follow-up  Scheduled to see [name]  next week.  -Review changes and assess symptom management at that time.    Anxiety  AYA Needs  Patient is 21 and in the past year has had the collision of numerous medical challenges which have pushed her to see her health and future medical care more clearly. Trying to balance all of her medical issues this with future goals of career, nursing, college. Significant burden of anxiety, maybe depression as well. Was reportedly diagnosed with medical trauma by the counseling office at her university. Also trying to establish her own autonomy after a medicalized childhood  - connected with AYA team, [name]. May need input from [facility] or others at some point  - seen by AYA palliative care sarcoma collaborative, Dr. Lanetta Inch, will assist with pain management and symptom control, as well as grappling with current issues relating to cancer, AYA, development     Tumor pain: seen by AYA sarcoma palliative care, Dr. Saddie Benders, as well as pain team - [name] has been working closely with her    Contraception  Fertility Management  -completed egg retrieval with successful egg harvest! 22 eggs    Line Associated DVT:   Duplex demonstrates DVT shortly after line placement. Treated with Apixaban 5mg  BID x 3 mos, now completed    Plan:  -RTC 1 month for tox check    I personally reviewed the medical records, pathology and laboratory results and viewed the imaging. All questions were answered to the patient's apparent satisfaction and they voiced understanding and agreement with the plan. Pt has my card and contact information and is encouraged to reach out with any further questions.    Donzetta Sprung, MD/MPH  Assistant Professor  Bone and Soft Tissue Oncology Program  [facility]  Pager: [phone]  Nurse Navigator: Roseanne Reno RN, BSN, OCN      REASON FOR CONSULTATION:   [name] [name] is a 22 y.o. female who is seen in consultation at the request of Referring, None Per Dennie Bible* for evaluation of her desmoid fibromatosis    HISTORY OF PRESENT ILLNESS:      Attends school at [employer] in [place], [place]      Right arm desmoid is the most bothersome - significant arm cramping, deep aching pain, lasts for ~30 min after cramping starts. Has noticed right hand shaking frequently. 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     Left chest lesion used to be quite uncomfortable, caused swelling in her arm and discomfort. Improved after cryoablation.     Right shoulder blade hurts when she leans back against it, fairly painful. Right chest wall lesion has some associated pain, improving since cryo.     First diagnosed at 22 years old - seen in [place] at first, had numerous cysts. First major issues were two paraspinal lesions, referred to [facility] for biopsy and ultimately resection.  Soon diagnosed with [name], seen by medical genetics early on.  Guided by Dr. Debbe Mounts, referred out to proceduralists as needed.  2011 - had a growing lesion on her back as well as bilateral neck lesions, resected at [facility], found to be desmoid  02/2012 - surgery for thoracic spine desmoid. Maybe another flank lesion removed  2017 - wrist lesion treated by VIR with bupivicaine, kenalog  08/2016 - endoscopy with adenomatous polyp of the colon, non-dysplastic  02/2017 - subcutaneous lesion resection by plastic surgery (postauricular and RUE, RLE) - path revealed epidermal inclusion cysts, osteomas  05/2018: cryotherapy  of left anterior chest wall lesion  05/2018: started sulindac but struggled with adherence due to GI side effects  08/2018: cryo #2 to chest wall lesion  12/10/2018: started sorafenib due to progression in left paraspinal lesion, ?posterior chest wall lesion  02/2019: discontinued sorafenib for several reasons: fatigue, dizziness, nausea, hair loss, as well as COVID infection in late November. Derm consulted for rash / hair loss, started on antihistamines, topical steroids. Trialed lower dose but ultimately stopped again 05/2019 due to toxicity.  06/2019: follow up with surgical oncology - plans for observation rather than repeat surgery  07/2019: cryoablation of anterior chest wall desmoid  07/2019: excision of subcutaneous LE mass and L neck masses, clinically appeared consistent with cysts  08/2019: cryoablation of R posterior chest wall, left paraspinal desmoid     Interval History:  History of Present Illness  A 22 year old patient with a history of desmoid fibromatosis and familial adenomatous polyposis presents for a toxicity check related to her current treatment with nirogacestat. She reports significant pain and muscle spasticity, which recently necessitated hospital admission. The patient has been experiencing hot flashes, which she describes as intense and lasting up to an hour. These episodes are accompanied by nausea and vomiting, which have led to a fear of eating and subsequent weight loss. The patient also reports a history of urinary retention and has been experiencing weight loss. She is currently on various medications including diazepam, mirtazapine, and nargazostat.      REVIEW OF SYSTEMS:  A comprehensive review of 12 systems was negative except for pertinent positives noted in HPI.    Past Medical History, Surgical History, Family History were reviewed personally. Any changes were updated as above.    Social History:  Attended [employer] in [place], has been interrupted by health issues  Wants to be a Engineer, civil (consulting) in pediatric hematology/oncology   Mother Natalia Leatherwood is a strong advocate, often present at visits    Pregnancy status: premenopausal, interested in future children    ALLERGIES/MEDICATIONS:  Reviewed in Epic    PHYSICAL EXAM:   BP 122/76  - Pulse 90  - Temp 36.9 ??C (98.4 ??F) (Oral)  - Resp 16  - Ht 165.1 cm (5' 5)  - Wt 58.5 kg (129 lb)  - SpO2 97%  - BMI 21.47 kg/m??   ECOG 1  Gen - well appearing, mother accompanying her   Eyes - conjunctivae clear, PERRL  ENT -no scleral icterus, moist mucous membranes  Lymph - no cervical or supraclavicular lymphadenopathy appreciated  Resp - clear to auscultation bilaterally, no wheezes, crackles or rales  CV - normal rate, regular rhythm, no lower extremity edema noted  Skin - flushing of skin noted on chest  Neuro - AOx3, EOM intact, no facial droop, speech fluent and coherent, moves all extremities without asymmetry  Psych - affect anxious  MSK - no joint tenderness or swelling    PATHOLOGY:   Pathology was personally reviewed as described in the HPI, detailed in Epic.     LABS:  Reviewed in Epic    RADIOLOGY:  Imaging was personally viewed and interpreted as summarized in the history (see above).

## 2022-12-16 NOTE — Unmapped (Signed)
They should call you to schedule the Thyroid ultrasound. If not, call phone number below.     Radiology scheduling: [phone]

## 2022-12-16 NOTE — Unmapped (Signed)
Lab orders

## 2022-12-17 DIAGNOSIS — R109 Unspecified abdominal pain: Secondary | ICD-10-CM | POA: Diagnosis not present

## 2022-12-17 DIAGNOSIS — K91850 Pouchitis: Secondary | ICD-10-CM | POA: Diagnosis not present

## 2022-12-17 DIAGNOSIS — R1084 Generalized abdominal pain: Secondary | ICD-10-CM | POA: Diagnosis not present

## 2022-12-17 DIAGNOSIS — Q8789 Other specified congenital malformation syndromes, not elsewhere classified: Secondary | ICD-10-CM | POA: Diagnosis not present

## 2022-12-17 NOTE — Unmapped (Signed)
[facility] Refill Coordination Note    Specialty Medication(s) to be Shipped:   Hematology/Oncology: Ogsiveo 50mg     Other medication(s) to be shipped: No additional medications requested for fill at this time     [name] [name], DOB: 03/06/2001  Phone: [phone] (home) [phone] (work)      All above HIPAA information was verified with patient.     Was a Nurse, learning disability used for this call? No    Completed refill call assessment today to schedule patient's medication shipment from the [facility]  [phone]).  All relevant notes have been reviewed.     Specialty medication(s) and dose(s) confirmed: Regimen is correct and unchanged.   Changes to medications: Debi reports no changes at this time.  Changes to insurance: No  New side effects reported not previously addressed with a pharmacist or physician: Yes - Patient reports Dry mouth, taste buds are changing, severe nausea, hot flashes and migraines. Patient would not like to speak to the pharmacist today. Their provider is aware.  Questions for the pharmacist: No    Confirmed patient received a Conservation officer, historic buildings and a Surveyor, mining with first shipment. The patient will receive a drug information handout for each medication shipped and additional FDA Medication Guides as required.       DISEASE/MEDICATION-SPECIFIC INFORMATION        N/A    SPECIALTY MEDICATION ADHERENCE     Medication Adherence    Patient reported X missed doses in the last month: 1  Specialty Medication: Ogsiveo 150 mg  Patient is on additional specialty medications: No  Informant: patient     Were doses missed due to medication being on hold? No    Ogsiveo 50 mg: 12 days of medicine on hand       REFERRAL TO PHARMACIST     Referral to the pharmacist: Not needed      [facility]     Shipping address confirmed in Epic.       Delivery Scheduled: Yes, Expected medication delivery date: 12/25/22.     Medication will be delivered via UPS to the prescription address in Epic [place].    Kayra Crowell M Elisabeth Cara   [facility]  Specialty Technician

## 2022-12-17 NOTE — Unmapped (Signed)
I saw and evaluated the patient, participating in the key portions of the service.  I reviewed the residents note.  I agree with the residents findings and plan. Nila Nephew, MD

## 2022-12-18 DIAGNOSIS — K91850 Pouchitis: Secondary | ICD-10-CM | POA: Diagnosis not present

## 2022-12-18 DIAGNOSIS — R1084 Generalized abdominal pain: Secondary | ICD-10-CM | POA: Diagnosis not present

## 2022-12-18 DIAGNOSIS — Q8789 Other specified congenital malformation syndromes, not elsewhere classified: Secondary | ICD-10-CM | POA: Diagnosis not present

## 2022-12-18 DIAGNOSIS — R109 Unspecified abdominal pain: Secondary | ICD-10-CM | POA: Diagnosis not present

## 2022-12-19 DIAGNOSIS — K91850 Pouchitis: Secondary | ICD-10-CM | POA: Diagnosis not present

## 2022-12-19 DIAGNOSIS — R109 Unspecified abdominal pain: Secondary | ICD-10-CM | POA: Diagnosis not present

## 2022-12-19 DIAGNOSIS — Q8789 Other specified congenital malformation syndromes, not elsewhere classified: Secondary | ICD-10-CM | POA: Diagnosis not present

## 2022-12-19 DIAGNOSIS — R1084 Generalized abdominal pain: Secondary | ICD-10-CM | POA: Diagnosis not present

## 2022-12-20 DIAGNOSIS — Q8789 Other specified congenital malformation syndromes, not elsewhere classified: Secondary | ICD-10-CM | POA: Diagnosis not present

## 2022-12-20 DIAGNOSIS — R109 Unspecified abdominal pain: Secondary | ICD-10-CM | POA: Diagnosis not present

## 2022-12-20 DIAGNOSIS — R1084 Generalized abdominal pain: Secondary | ICD-10-CM | POA: Diagnosis not present

## 2022-12-20 DIAGNOSIS — K91850 Pouchitis: Secondary | ICD-10-CM | POA: Diagnosis not present

## 2022-12-21 DIAGNOSIS — R1084 Generalized abdominal pain: Secondary | ICD-10-CM | POA: Diagnosis not present

## 2022-12-21 DIAGNOSIS — K91850 Pouchitis: Secondary | ICD-10-CM | POA: Diagnosis not present

## 2022-12-21 DIAGNOSIS — R109 Unspecified abdominal pain: Secondary | ICD-10-CM | POA: Diagnosis not present

## 2022-12-21 DIAGNOSIS — Q8789 Other specified congenital malformation syndromes, not elsewhere classified: Secondary | ICD-10-CM | POA: Diagnosis not present

## 2022-12-22 DIAGNOSIS — K91850 Pouchitis: Secondary | ICD-10-CM | POA: Diagnosis not present

## 2022-12-22 DIAGNOSIS — R109 Unspecified abdominal pain: Secondary | ICD-10-CM | POA: Diagnosis not present

## 2022-12-22 DIAGNOSIS — R1084 Generalized abdominal pain: Secondary | ICD-10-CM | POA: Diagnosis not present

## 2022-12-22 DIAGNOSIS — Q8789 Other specified congenital malformation syndromes, not elsewhere classified: Secondary | ICD-10-CM | POA: Diagnosis not present

## 2022-12-23 ENCOUNTER — Ambulatory Visit
Admit: 2022-12-23 | Discharge: 2022-12-24 | Payer: PRIVATE HEALTH INSURANCE | Attending: Student in an Organized Health Care Education/Training Program | Primary: Student in an Organized Health Care Education/Training Program

## 2022-12-23 DIAGNOSIS — Z0289 Encounter for other administrative examinations: Principal | ICD-10-CM

## 2022-12-23 DIAGNOSIS — D48119 Desmoid tumor of unspecified site: Secondary | ICD-10-CM | POA: Diagnosis not present

## 2022-12-23 DIAGNOSIS — M7918 Myalgia, other site: Principal | ICD-10-CM

## 2022-12-23 DIAGNOSIS — R1030 Lower abdominal pain, unspecified: Principal | ICD-10-CM

## 2022-12-23 DIAGNOSIS — R1084 Generalized abdominal pain: Principal | ICD-10-CM

## 2022-12-23 DIAGNOSIS — R110 Nausea: Principal | ICD-10-CM

## 2022-12-23 DIAGNOSIS — G894 Chronic pain syndrome: Principal | ICD-10-CM

## 2022-12-23 DIAGNOSIS — F1190 Opioid use, unspecified, uncomplicated: Principal | ICD-10-CM

## 2022-12-23 DIAGNOSIS — G893 Neoplasm related pain (acute) (chronic): Principal | ICD-10-CM

## 2022-12-23 DIAGNOSIS — R208 Other disturbances of skin sensation: Principal | ICD-10-CM

## 2022-12-23 DIAGNOSIS — R109 Unspecified abdominal pain: Secondary | ICD-10-CM | POA: Diagnosis not present

## 2022-12-23 DIAGNOSIS — K91850 Pouchitis: Secondary | ICD-10-CM | POA: Diagnosis not present

## 2022-12-23 DIAGNOSIS — Q8789 Other specified congenital malformation syndromes, not elsewhere classified: Secondary | ICD-10-CM | POA: Diagnosis not present

## 2022-12-23 MED ORDER — ONDANSETRON 8 MG DISINTEGRATING TABLET
ORAL_TABLET | Freq: Two times a day (BID) | 0 refills | 30 days | Status: CP | PRN
Start: 2022-12-23 — End: 2023-01-22

## 2022-12-23 MED ORDER — NALOXONE 4 MG/ACTUATION NASAL SPRAY
0 refills | 0 days | Status: CP
Start: 2022-12-23 — End: ?

## 2022-12-23 MED ORDER — PANTOPRAZOLE 40 MG TABLET,DELAYED RELEASE
ORAL_TABLET | Freq: Two times a day (BID) | ORAL | 0 refills | 30 days | Status: CP
Start: 2022-12-23 — End: 2023-01-22

## 2022-12-23 NOTE — Unmapped (Unsigned)
Chronic Pain Follow Up Note    Assessment and Plan    No diagnosis found.    [name] [name] is a 22 y.o. female with past medical history significant for DVT (RUE, s/p Eliquis x3 months), [name] syndrome s/p colectomy, desmoid tumor, anemia, and severe protein-calorie malnutrition who is being followed at [facility] clinic for abdominal and right shoulder pain that is consistent with the diagnosis of cancer associated pain.    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 Pt has continued and now chronic pain related to her tumors, has R shoulder pain as well though this is better controlled now. Follows with [facility] Dr [name] as well as by [facility] oncology.    Unfortunately, [name] [name] has been experiencing significantly worse abdominal pain over the last several months.  The etiology of her pain flare cycles is complex with likely multiple contributing factors.  The most likely explanation appears that she has intermittent pain flares that are caused by an unclear underlying process (potentially due to intermittent intestinal obstructions in the setting of her known mesenteric desmoid tumors, which may act as anchor points for torsion of her bowels) that resulted in an increased opioid requirement and often will lead to hospitalization.  At this point, her acute pain will be treated with IV opioids which will precipitate narcotic bowel syndrome that, in conjunction with her severe visceral hyperalgesia, quickly causes a new pain generator.  This results in a positive feedback loop with deleterious consequences in which she requires more IV opioids to treat her pain but the additional IV opioids further the underlying issue.    Visceral hyperalgesia, narcotic bowel syndrome, desmoid tumors of the gastrointestinal tract, cancer related pain, chronic pain syndrome, allodynia, generalized abdominal pain ***  Chronic, globally worsening, not at goal.  Patient was recently admitted for approximately 6 weeks after a pain flare and required significant doses of IV opioids, failed to improve significantly after a long-term ketamine infusion and a lidocaine infusion and had a pouchoscopy to rule out pouchitis that resulted in profound abdominal pain.  It was during this hospitalization that, in consultation with her gastroenterologist, the concern for narcotic bowel syndrome was elevated on the differential as the cause of the underlying pain that kept her admitted.  As described above, she likely does have a degree of desmoid related pain that is probably the trigger to most of her pain flares but this quickly leads to a cycle of increased opioid consumption resulting in narcotic bowel syndrome -> severe abdominal pain given her exquisite visceral hypersensitivity.  She was eventually able to wean off of IV opioids and wean down her oxycodone, being helped by a transition from her Butrans patch to Suboxone 0.5-0.125 3 times daily.  She did not tolerate Belbuca, although it appears that this was because she was waiting 30 to 40 minutes for the entire film to dissolve.  I reiterated today that the majority of the medication is absorbed after 10 minutes and at that time she can remove the film if we were to proceed with Belbuca as a means of transitioning her from Suboxone back to [place] in the future.  Ultimately, we will keep her Suboxone unchanged as we first wean her oxycodone.    Today she underwent egg harvest prior to initiation of her chemotherapy.  We discussed that I am hopeful that the chemotherapy will help the underlying desmoid  tumor burden and therefore decrease the triggers of her pain flares as described above.  However, today she was experiencing severe pain after the procedure.  I spoke with Dr. Selinda Michaels, her anesthesiologist over the phone to help direct postoperative pain control.  I then saw [name] [name] virtually.  Though she appeared comfortable, she states that she is in worse pain than she had been while admitted earlier this month.  She is aware, and her proceduralists reinforced this earlier today, that she should expect increased pain over the next 1 to 4 days as a result of the inflammation from the procedure.  I reiterated that she is very likely to be able to manage her pain with the following adjustments in order to make it through this pain flare and avoid getting hospitalized and thereby avoid the feedback loop that results of narcotic bowel syndrome.  She verbalized understanding and noted a strong desire to make it through this pain flare and not get hospitalized.  In order to minimize her opioid burden, we will have her increase her Lyrica transiently to 50 mg twice daily despite the increased risk of swelling but decrease this back to 25 mg AM/50 mg p.m. and eventually 25 mg twice daily after the pain flare subsides.  We also discussed her increasing her baclofen to 10 mg 3 times daily.  She will continue to take APAP on a scheduled basis, though will only take 500 mg this afternoon and this evening given the acetaminophen that she received periprocedurally.  Though she has had GI bleeds in the past on NSAIDs, the risk of increased narcotic intake outweighs the risk of a GI bleed with low-dose and infrequent, short-lived NSAID use.  She will take 400 mg ibuprofen twice daily for 2 days to help with the inflammation.  Regarding her narcotics, I reemphasized the need for rest to treat her pain but not overtreat to the point of causing narcotic bowel syndrome.  She knows that she is very sensitive to medications and we discussed that this is the underlying rationale for only increasing her total oxycodone intake from #3 tabs per day to #5 tabs per day.  I did tell her that it was okay to take 2 tabs at a time so long as she does not exceed her daily allotment.  We will continue her Suboxone unchanged for the next month and plan to wean her oxycodone back to her #3 tabs of 10 mg after the pain flare subsides and we made a goal for her to be off of oxycodone by the time she follows up in clinic.  Finally, the patient is now on p.o. Ativan 3 times daily which was filled for 3 months by psychiatry after discharge.  This is being prescribed and lieu of the ability to prescribe Atarax given concerns for anticholinergic symptoms.  We discussed the increased risk of respiratory depression given concomitant opioid and benzodiazepine consumption and reiterated that this makes it even more important for her to wean off of full mu agonist as soon as possible.  Discussed that she is not to take benzodiazepines at the same time as opioids and she verbalized understanding.    -Continue APAP 1000 mg 3 times daily  -Will do 2 days of ibuprofen 400 mg twice daily  -Increase baclofen to 10 mg 3 times daily  -Increase Lyrica to 50 mg twice daily for the next 4 days, titration instructions provided to decrease down to Lyrica 25 mg twice daily after the pain flare subsides  -  Increase oxycodone 10 mg to #5 tabs maximum per day during the pain flare for the next 4 to 5 days  -Return to oxycodone 10 mg 3 times daily after the pain flare subsides  -Wean off of oxycodone by next visit  -Continue Suboxone 0.5/0.125 mg 3 times daily, filled x 1 month  -Plan to wean to Belbuca at next visit followed by wean back to her Butrans patch at 20 mcg/h  -Patient notes that she will not take benzodiazepines at the same time as opioids and will return to her home Atarax as soon as she is able to from a urinary standpoint    Of note: The patient has received both ketamine infusions and lidocaine infusions as an inpatient for flareups of her chronic abdominal pain and states that lidocaine infusions have been significantly more helpful in the past, though she is also tolerated ketamine infusions. Would recommend this in future hospitalizations to treat severe and uncontrolled abdominal pain exacerbations -previous lidocaine infusion was intermittently on due to transaminitis experienced by the patient which may or may not have been from the infusion but from TPN or an intra-abdominal process that was occurring.  She states that she did really well after the lidocaine infusion for a couple of weeks.    Myofascial pain   ***  Chronic, ongoing, at goal. Not primary concern at this time.    - Consider repeat TPI PRN  - Home exercise program  - Encouraged slow resumption of normal activity and times when pain has decreased    Medication Monitoring  [place] PDMP was reviewed today and it was appropriate.  Patient reported pill counts at home and they were appropriate  Last urine toxicology screen: 12/04/21  Treatment agreement last reviewed and signed: 12/04/21  Aberrancy with treatment plan? no  Opioid Risk: moderate (benzo)  Pain psychology: no  Benzodiazepines: yes, knows not to take alongside opioids  Naloxone: Yes  EKG: NSR with normal Qtc (428 ms April 2024)  MME: 45 + 1.5 mg total suboxone daily    I have reviewed the [facility] Medical Board statement on use of controlled substances for the treatment of pain as well as the CDC Guideline for Prescribing Opioids for Chronic Pain. I have reviewed the [place] Controlled Substance Monitoring Database.    No follow-ups on file.    PLAN:  Today I have prescribed:  Requested Prescriptions      No prescriptions requested or ordered in this encounter     No orders of the defined types were placed in this encounter.     HPI  [name] [name] is a 22 y.o. being followed at [facility] clinic for complaint of chronic pain localized to her shoulders and abdomen.      Patient was last seen virtually in June.     Since last visit, the patient has followed with [facility] and Psychiatry. She was admitted on 09/25/22 and 11/19/22 for desmoid tumor. The patient presented to the ED on 12/12/22 NG tube placement.     Today, ***    Current analgesic regimen:  -Butrans 10 mcg/h  - Baclofen 5 mg TID prn  - Oxycodone 10mg  q4 PRN  - Tylenol PRN  - Voltaren 1% gel     Current view: Showing all answers            Video Visit Questionnaire       Question 09/03/2022  1:54 PM EDT - Filed by Patient 08/21/2022  2:29 PM EDT -  Filed by Patient 07/17/2022  2:30 PM EDT - Ceasar Mons by Patient    Virtual Care Guidelines Accept Accept Accept           .The patient states her pain is located *** and the severity of her pain ranges from ***/10 to ***/10.  Her pain currently is ***/10 and on average is ***/10.  She describes the sensation of her pain as {pain described:58928}. Her pain is present {pain frequency:58931} and worst {pain worst:59685}. The patient???s pain impacts {negatively affected:59709}. Her interval history includes {interval history:59710}. Her pain {pain changed:59711}, and she {does does not blank:47747} have new pain to discuss today. She {is/is not:36772} on blood thinners or anti-coagulants. In regards to medications currently taken for pain management, the patient {is:54246::is} tolerating these medications well and complains of associated side effects: {pain med side effects:59712}.    Previous Medication Trials:  NSAIDS: Toradol (c/f GIB after)  Antidepressant/anxiety: Cymbalta (stopped due to mood side effects), amitriptyline (insomnia and increased J-pouch output)  Anticonvulsants: Gabapentin  Muscle relaxants: Robaxin  Topicals: lidocaine, coltaren gel  Short-acting opiates: oxycodone, PO HM  Long-acting opiates:   Other: Tylenol    Previous Interventions  Procedures:   - TPIs in August 2023 with initial pain flare, subsequent improvement, and near immediate relief flare of pain; hyperalgesic with procedure  - TPIs of Right trapezius, rhomboid, infraspinatus on 12/04/2021.  - TPIs on 10/25  - Celiac plexus block on 11/1 (severe post-procedural pain flare requiring admission for pain control, significant hyperalgesia)  - TPIs 11/22    Infusions:  Lidocaine infusions helped during inpatient exacerbations of pain more than ketamine infusions    PT: Yes    Allergies  Allergies   Allergen Reactions    Adhesive Tape-Silicones Hives and Rash     Paper tape and Tegederm ok. Biopatch causes blistering but has tolerated CHG Tegaderm    Ferrlecit [Sodium Ferric Gluconat-Sucrose] Swelling and Rash    Levofloxacin Swelling and Rash     Swelling in mouth, rash,     Methylnaltrexone      Per Patient: I lost bowel control, severe abdominal cramping, and elevated BP    Neomycin Swelling     Rxn after ear drops; ear swelling    Papaya Hives    Morphine Nausea And Vomiting    Zosyn [Piperacillin-Tazobactam] Itching and Rash     Red and itchy    Compazine [Prochlorperazine] Other (See Comments)     Extreme agitation    Iron Analogues     Iron Dextran Itching     Received iron dextran 06/08/22 over 12 hours, had itching and redness/flushing during the infusion and for a couple days after. Required IV benadryl w/flares between doses and ultimately treated w/IV methylpred for 2 days.     Latex, Natural Rubber Rash       Home Medications    Current Outpatient Medications   Medication Sig Dispense Refill    acetaminophen (TYLENOL) 500 MG tablet Take 2 tablets (1,000 mg total) by mouth every eight (8) hours.      ammonium lactate (LAC-HYDRIN) 12 % lotion Apply 1 Application topically two (2) times a day. 226 g 0    cholecalciferol, vitamin D3 25 mcg, 1,000 units,, 1,000 unit (25 mcg) tablet Take 1 tablet (25 mcg total) by mouth daily. 100 tablet 3    clobetasoL (TEMOVATE) 0.05 % ointment Apply topically two (2) times a day. To stubborn/thick rashes on hands/feet ears until clear/smooth. Restart as needed 60 g 3  COURIERED MED OR SUPPLY OGSVEO 50 MG TABLET. 1 each 0    diazePAM (VALIUM) 5 MG tablet Take 0.5 tablets (2.5 mg total) by mouth two (2) times a day. 30 tablet 0    escitalopram oxalate (LEXAPRO) 5 MG tablet Take 1 tablet (5 mg total) by mouth daily. 30 tablet 2    famotidine (PEPCID) 20 MG tablet Take 1 tablet (20 mg total) by mouth nightly. 30 tablet 0    hydrocortisone 2.5 % cream Apply topically two (2) times a day as needed (hemorrhoids). 30 g 0    lidocaine (ASPERCREME) 4 % patch Place 2 patches on the skin daily. 60 patch 0    LORazepam (ATIVAN) 0.5 MG tablet Take 2 tablets (1 mg total) by mouth Three (3) times a day as needed for anxiety.      mirtazapine (REMERON) 15 MG tablet Take 1 tablet (15 mg total) by mouth nightly. 90 tablet 3    multivitamin with folic acid 400 mcg Tab tablet Take 1 tablet by mouth daily. 30 tablet 0    naloxegol (MOVANTIK) 12.5 mg tablet Take 1 tablet (12.5 mg total) by mouth daily. 30 tablet 0    naloxone (NARCAN) 4 mg nasal spray One spray in either nostril once for known/suspected opioid overdose. May repeat every 2-3 minutes in alternating nostril til EMS arrives 2 each 0    nirogacestat (OGSIVEO) 50 mg tablet Take 3 tablets (150 mg total) by mouth two (2) times a day. Swallow tablets whole; do not break, crush, or chew. 180 tablet 5    pimecrolimus (ELIDEL) 1 % cream Apply topically two (2) times a day as needed. To face as needed for rash      promethazine (PHENERGAN) 12.5 MG tablet Take 1 tablet (12.5 mg total) by mouth every six (6) hours as needed. 120 tablet 0    scopolamine (TRANSDERM-SCOP) 1 mg over 3 days Place 1 patch (1 mg total) on the skin every third day. 10 patch 0    triamcinolone (KENALOG) 0.1 % cream Apply topically two (2) times a day as needed. Apply to rash as needed      zinc oxide-cod liver oil (DESITIN 40%) 40 % Pste Apply topically daily as needed. 57 g 0     No current facility-administered medications for this visit.       Review Of Systems  General {ajlrosgen:46920}  Cardiovascular {aggROSCV:37303}  Gastrointestinal {aggROSGI:37304}  Skin {aggROSskin:37305}  Endocrine {aggROSendo:37306}  Musculoskeletal {aggROSmusk:37307}  Neurologic {aggROSneu:37308}  Psychiatric {aggROSpsych:37309}      Physical Exam    VITALS:   There were no vitals filed for this visit.          Wt Readings from Last 3 Encounters:   12/16/22 58.5 kg (129 lb)   12/16/22 58.5 kg (129 lb)   12/08/22 61.3 kg (135 lb 3.2 oz)     GENERAL:  The patient is well developed, well-nourished and appears to be in {Mild/Mod/Sev/No:43464} distress. The patient is pleasant and interactive. Patient is a good historian.  HEAD/NECK:    Reveals normocephalic/atraumatic. Clear sclera. Mask in place.  LUNGS:   Normal work of breathing, no supplemental O2.  EXTREMITIES:  No clubbing, cyanosis noted.  NEUROLOGIC:    The patient was alert and oriented, speech fluent, normal language. CN 2-12 grossly intact.  MUSCULOSKELETAL:    Motor function  {Pain Motor [phone]::5/5} in all extremities with normal tone and bulk. Good range of motion of all extremities. The patient was able to ambulate  without difficult throughout the clinic today {With/without:5700} the assistance of a walking aid.   SKIN:   No obvious rashes, lesions, or erythema.  PSY:  Appropriate affect.

## 2022-12-23 NOTE — Unmapped (Signed)
Medication:buprenorphine/nal 2/0.5mg     SIG: No SIG on bottle  Quantity on RX: #22  Filled on:09/09/22  Pill count today: # 5    Medication:oxycodone 10 mg  SIG:1 TID prn  Quantity on RX: #90  Filled on:09/08/22  Pill count today: # 35

## 2022-12-24 DIAGNOSIS — Q8789 Other specified congenital malformation syndromes, not elsewhere classified: Secondary | ICD-10-CM | POA: Diagnosis not present

## 2022-12-24 DIAGNOSIS — R1084 Generalized abdominal pain: Secondary | ICD-10-CM | POA: Diagnosis not present

## 2022-12-24 DIAGNOSIS — K91850 Pouchitis: Secondary | ICD-10-CM | POA: Diagnosis not present

## 2022-12-24 DIAGNOSIS — R109 Unspecified abdominal pain: Secondary | ICD-10-CM | POA: Diagnosis not present

## 2022-12-24 MED FILL — OGSIVEO 50 MG TABLET: ORAL | 15 days supply | Qty: 90 | Fill #2

## 2022-12-24 NOTE — Unmapped (Signed)
Closed    Close reason: Closed by Health Plan/Payer/Processor/PBM  Payer: [email or web address] Case ID: [id number]  Note from payer: No Authorization Required.No Authorization Required.Please note this request does not require prior authorization review. (Please contact pharmacy to process prescription). Thank you.  View History  Medication Being Authorized    oxyCODONE (ROXICODONE) 10 mg immediate release tablet  Take 1 tablet (10 mg total) by mouth every eight (8) hours as needed for pain. Fill on or after: 01/01/23  Dispense: 90 tablet Refills: 0   Start: 01/01/2023   Class: Normal Diagnoses: Chronic pain syndrome   This order has been released to its destination.  To be filled at: [id number] - [place], [place] - [address] AT [address] OF [facility] [address]

## 2022-12-24 NOTE — Unmapped (Signed)
Approved    Prior authorization approved  Payer: [employer] Case ID: [id number]  Note from payer: Approved.  Approval Details    Authorized from December 23, 2022 to December 23, 2023  Electronic appeal: Not supported  View History  Medication Being Authorized    pantoprazole (PROTONIX) 40 MG tablet  Take 1 tablet (40 mg total) by mouth two (2) times a day.  Dispense: 60 tablet Refills: 0   Start: 12/23/2022 End: 01/22/2023   Class: Normal Diagnoses: Generalized abdominal pain   This order has been released to its destination.  To be filled at: [id number] - [place], [place] - [address] AT [address] OF [facility] [address]

## 2022-12-24 NOTE — Unmapped (Signed)
Closed    Close reason: Closed by Health Plan/Payer/Processor/PBM  Payer: [employer] Case ID: [id number]  Note from payer: No Authorization Required.No Authorization Required.Please note this request does not require prior authorization review. Paid claim on file on 12/23/2022. Thank you.  View History  Medication Being Authorized    naloxone (NARCAN) 4 mg nasal spray  One spray in either nostril once for known/suspected opioid overdose. May repeat every 2-3 minutes in alternating nostril til EMS arrives  Dispense: 2 each Refills: 0   Start: 12/23/2022   Class: Normal Diagnoses: Chronic pain syndrome   This order has been released to its destination.  To be filled at: [id number] - [place], [place] - [address] AT [address] OF [facility] [address]

## 2022-12-24 NOTE — Unmapped (Signed)
I LVM with patient [name] [name] to confirm appointments on the following date(s): 11/15    [facility]

## 2022-12-24 NOTE — Unmapped (Unsigned)
[facility]  CONSULT NOTE - INFLAMMATORY BOWEL DISEASE  12/27/2022    Demographics:  [name] [name] is a 22 y.o. year old female    Referring physician:   Pcp, None Per Patient  [address] [place],  [place] [address]    Patient Care Team:  Jarold Motto, [facility] as PCP - General  Debbe Mounts, MD as Consulting Physician (Peds: Pediatric Hematology-Oncology)  Cherlynn Perches, MD as Consulting Physician (Orthopedic Surgery)  Jobe Gibbon, MD as Consulting Physician (Interventional Radiology)  Twana First, MD as Consulting Physician  Tatton-Howard, Clelia Croft, RN as Registered Nurse (Oncology Navigator)  Lenise Arena, Judye Bos, MD as Attending Provider (Surgical Oncology)  Clarene Duke, MD as Consulting Physician (Plastic Surgery)  Helyn Numbers, MD as Consulting Physician (Gastroenterology)  Childers, Biagio Quint, MD as Consulting Physician (Palliative Medicine)  Belva Bertin, RN as Registered Nurse (Palliative Medicine)  Lajuana Matte, RN as Registered Nurse (Oncology Navigator)  Sheral Apley, MD (Medical Oncology)  Lajuana Matte, RN as Registered Nurse (Oncology Navigator)  [name], MD as Fellow (Psychiatry)           HPI / NOTE :     Today, I saw [name] [name] for follow-up consultation in the [facility].  Outside records including available clinical notes, endoscopy reports, imaging results and pathology results were reviewed in detail as part of this follow-up consultation.       Chief complaint: Follow-up after hospitalization    HPI:      Was just discharged after long hospitalization after  she was admitted after outpatient procedure which resulted in a syncope, significant exacerbation of abdominal pain, need for intensified pain management exacerbation of her general anxiety disorder, inability to move or eat, and worsening of her depression. Patient was hospitalized from [facility] July 2024 until early October 2024. Multiple teams including psychiatry, gastroenterology, oncology, pain management , nutrition  were involved.     No at home with a Corpak, generalized anxiety that she is not able yo eat, on pain management.     Pain therapy for abdominal pain, chronic pain syndrome , diffuse myofascial pain : on diazepam 5 mg , baclofen, oxycodone 10 mg three times daily suboxone 1.5 mg twice daily which can be increased to 3 times daily  . Buprenorphine  patch 20 mcg/hour (increased during hospitalization),  baclofen as needed, oxycodone as needed managed in pain clinic.    Anxiety/nausea : Lexapro 5 mg daily phenergan 12.5 mg as needed , mirtazapine 7.5  mg.   Desmoid therapy: Off sorafenib, which may have caused bleeding in past    Nutrition: Currently on enteral feeds, not able to consistently eat, if eating she tells wm she eats a Hamburger.    Pouch: Bowel frequency was around 7-8 now more liquid when on enteral diet.       Review of Systems: positive for [name]  Otherwise, the balance of 10 systems is negative.    Bowel frequency /24 hours 8-12  Bowel frequency during night 0-1  Blood in bowel movements none  Abdominal pain  Moderate = 2  Bloating frequent  Incontinence none  Bodyweight increasing  Loperamide No  Lomotil No  Hyoscyamine:  No  Antibiotic none            Past Medical History:   Past medical history:   Past Medical History:   Diagnosis Date    Abdominal  pain     Acid reflux     occas    Anesthesia complication     itching, shaking, coldness; last few surgeries have gone much better    Cancer (CMS-HCC)     Cataract of right eye     COVID-19 virus infection 01/2019    Cyst of thyroid determined by ultrasound     monitoring    [name]     2 right forearm, 1 left thigh, 1 right scapula, 1 under left clavicle; multiple    Difficult intravenous access     [name] (familial adenomatous polyposis)     [name]     Gastric polyps     History of chemotherapy     last treatment approx 05/2019    History of colon polyps     History of COVID-19 01/2019    Iron deficiency anemia due to chronic blood loss     received iron infusion 11-2019    PONV (postoperative nausea and vomiting)     Rectal bleeding     Syncopal episodes     especially if becoming dehydrated     Past surgical history:   Past Surgical History:   Procedure Laterality Date    COLON SURGERY      cyroablation      cystis removal      desmoid removal      PR CLOSE ENTEROSTOMY,RESEC+ANAST N/A 10/09/2020    Procedure: ILEOSTOMY TAKEDOWN;  Surgeon: Mickle Asper, MD;  Location: OR [place];  Service: General Surgery    PR COLONOSCOPY W/BIOPSY SINGLE/MULTIPLE N/A 10/27/2012    Procedure: COLONOSCOPY, FLEXIBLE, PROXIMAL TO SPLENIC FLEXURE; WITH BIOPSY, SINGLE OR MULTIPLE;  Surgeon: Shirlyn Goltz Mir, MD;  Location: PEDS PROCEDURE ROOM [place];  Service: Gastroenterology    PR COLONOSCOPY W/BIOPSY SINGLE/MULTIPLE N/A 09/14/2013    Procedure: COLONOSCOPY, FLEXIBLE, PROXIMAL TO SPLENIC FLEXURE; WITH BIOPSY, SINGLE OR MULTIPLE;  Surgeon: Shirlyn Goltz Mir, MD;  Location: PEDS PROCEDURE ROOM [place];  Service: Gastroenterology    PR COLONOSCOPY W/BIOPSY SINGLE/MULTIPLE N/A 11/08/2014    Procedure: COLONOSCOPY, FLEXIBLE, PROXIMAL TO SPLENIC FLEXURE; WITH BIOPSY, SINGLE OR MULTIPLE;  Surgeon: Arnold Long Mir, MD;  Location: PEDS PROCEDURE ROOM [facility];  Service: Gastroenterology    PR COLONOSCOPY W/BIOPSY SINGLE/MULTIPLE N/A 12/26/2015    Procedure: COLONOSCOPY, FLEXIBLE, PROXIMAL TO SPLENIC FLEXURE; WITH BIOPSY, SINGLE OR MULTIPLE;  Surgeon: Arnold Long Mir, MD;  Location: PEDS PROCEDURE ROOM [facility];  Service: Gastroenterology    PR COLONOSCOPY W/BIOPSY SINGLE/MULTIPLE N/A 09/02/2017    Procedure: COLONOSCOPY, FLEXIBLE, PROXIMAL TO SPLENIC FLEXURE; WITH BIOPSY, SINGLE OR MULTIPLE;  Surgeon: Arnold Long Mir, MD;  Location: PEDS PROCEDURE ROOM [place];  Service: Gastroenterology    PR COLSC FLX W/REMOVAL LESION BY HOT BX FORCEPS N/A 08/27/2016    Procedure: COLONOSCOPY, FLEXIBLE, PROXIMAL TO SPLENIC FLEXURE; W/REMOVAL TUMOR/POLYP/OTHER LESION, HOT BX FORCEP/CAUTE;  Surgeon: Arnold Long Mir, MD;  Location: PEDS PROCEDURE ROOM [facility];  Service: Gastroenterology    PR COLSC FLX W/RMVL OF TUMOR POLYP LESION SNARE TQ N/A 02/25/2019    Procedure: COLONOSCOPY FLEX; W/REMOV TUMOR/LES BY SNARE;  Surgeon: Helyn Numbers, MD;  [facility]: [facility];  Service: Gastroenterology    PR COLSC FLX W/RMVL OF TUMOR POLYP LESION SNARE TQ N/A 03/13/2020    Procedure: COLONOSCOPY FLEX; W/REMOV TUMOR/LES BY SNARE;  Surgeon: Helyn Numbers, MD;  [facility];  Service: Gastroenterology    PR EXC SKIN BENIG 2.1-3 CM TRUNK,ARM,LEG Right 02/25/2017    Procedure:  EXCISION, BENIGN LESION INCLUDE MARGINS, EXCEPT SKIN TAG, LEGS; EXCISED DIAMETER 2.1 TO 3.0 CM;  Surgeon: Clarene Duke, MD;  Location: CHILDRENS OR [facility];  Service: Plastics    PR EXC SKIN BENIG 3.1-4 CM TRUNK,ARM,LEG Right 02/25/2017    Procedure: EXCISION, BENIGN LESION INCLUDE MARGINS, EXCEPT SKIN TAG, ARMS; EXCISED DIAMETER 3.1 TO 4.0 CM;  Surgeon: Clarene Duke, MD;  Location: CHILDRENS OR [facility];  Service: Plastics    PR EXC SKIN BENIG >4 CM FACE,FACIAL Right 02/25/2017    Procedure: EXCISION, OTHER BENIGN LES INCLUD MARGIN, FACE/EARS/EYELIDS/NOSE/LIPS/MUCOUS MEMBRANE; EXCISED DIAM >4.0 CM;  Surgeon: Clarene Duke, MD;  Location: CHILDRENS OR [facility];  Service: Plastics    PR EXC TUMOR SOFT TISSUE LEG/ANKLE SUBQ 3+CM Right 08/05/2019    Procedure: EXCISION, TUMOR, SOFT TISSUE OF LEG OR ANKLE AREA, SUBCUTANEOUS; 3 CM OR GREATER;  Surgeon: Arsenio Katz, MD;  Location: MAIN OR [place];  Service: Plastics    PR EXC TUMOR SOFT TISSUE LEG/ANKLE SUBQ <3CM Right 08/05/2019    Procedure: EXCISION, TUMOR, SOFT TISSUE OF LEG OR ANKLE AREA, SUBCUTANEOUS; LESS THAN 3 CM;  Surgeon: Arsenio Katz, MD;  Location: MAIN OR [facility];  Service: Plastics    PR LAP, SURG PROCTECTOMY W J-POUCH N/A 08/10/2020    Procedure: ROBOTIC ASSISTED LAPAROSCOPIC PROCTOCOLECTOMY, ILEAL J POUCH, WITH OSTOMY;  Surgeon: Mickle Asper, MD;  Location: OR [place];  Service: General Surgery    PR NDSC EVAL INTSTINAL POUCH DX W/COLLJ SPEC SPX N/A 01/23/2021    Procedure: ENDO EVAL SM INTEST POUCH; DX;  Surgeon: Modena Nunnery, MD;  [facility]: [facility];  Service: Gastroenterology    PR NDSC EVAL INTSTINAL POUCH DX W/COLLJ SPEC SPX N/A 08/27/2021    Procedure: ENDO EVAL SM INTEST POUCH; DX;  Surgeon: Hunt Oris, MD;  Location: [facility]URES [facility];  Service: Gastroenterology    PR NDSC EVAL INTSTINAL POUCH DX W/COLLJ SPEC SPX N/A 12/09/2021    Procedure: ENDO EVAL SM INTEST POUCH; DX;  Surgeon: Vidal Schwalbe, MD;  [facility]: [facility];  Service: Gastroenterology    PR NDSC EVAL INTSTINAL POUCH DX W/COLLJ [facility] SPX Left 04/09/2022    Procedure: ENDO EVAL SM INTEST POUCH; DX;  Surgeon: Modena Nunnery, MD;  [facility];  Service: Gastroenterology    PR NDSC EVAL INTSTINAL POUCH DX W/COLLJ SPEC SPX N/A 08/05/2022    Procedure: ENDO EVAL SM INTEST POUCH; DX;  Surgeon: Modena Nunnery, MD;  Location: [facility] PROC[facility]URES [facility];  Service: Gastroenterology    PR NDSC EVAL INTSTINAL POUCH W/BX SINGLE/MULTIPLE N/A 01/20/2022    Procedure: ENDOSCOPIC EVAL OF SMALL INTESTINAL POUCH; DIAGNOSTIC, No biopsies;  Surgeon: Andrey Farmer, MD;  Location: GI PROC[facility]URES [facility];  Service: Gastroenterology    PR NDSC EVAL INTSTINAL POUCH W/BX SINGLE/MULTIPLE N/A 02/13/2022    Procedure: ENDOSCOPIC EVAL OF SMALL INTESTINAL POUCH; DIAGNOSTIC, WITH BIOPSY;  Surgeon: Bronson Curb, MD;  [facility]: [facility]URES [facility];  Service: Gastroenterology    PR UNLISTED PROCEDURE SMALL INTESTINE  01/23/2021    Procedure: UNLISTED PROCEDURE, SMALL INTESTINE;  Surgeon: Modena Nunnery, MD;  [facility];  Service: Gastroenterology    PR UNLISTED PROCEDURE SMALL INTESTINE  02/13/2022    Procedure: UNLISTED PROCEDURE, SMALL INTESTINE;  Surgeon: Bronson Curb, MD;  Location: [facility] PROC[facility]URES [facility];  Service: Gastroenterology    PR UPPER GI ENDOSCOPY,BIOPSY N/A 10/27/2012    Procedure: UGI ENDOSCOPY;  WITH BIOPSY, SINGLE OR MULTIPLE;  Surgeon: Shirlyn Goltz Mir, MD;  Location: PEDS PROCEDURE ROOM [facility];  Service: Gastroenterology    PR UPPER GI ENDOSCOPY,BIOPSY N/A 09/14/2013    Procedure: UGI ENDOSCOPY; WITH BIOPSY, SINGLE OR MULTIPLE;  Surgeon: Shirlyn Goltz Mir, MD;  Location: PEDS PROCEDURE ROOM [facility];  Service: Gastroenterology    PR UPPER GI ENDOSCOPY,BIOPSY N/A 11/08/2014    Procedure: UGI ENDOSCOPY; WITH BIOPSY, SINGLE OR MULTIPLE;  Surgeon: Arnold Long Mir, MD;  Location: PEDS PROCEDURE ROOM [facility];  Service: Gastroenterology    PR UPPER GI ENDOSCOPY,BIOPSY N/A 12/26/2015    Procedure: UGI ENDOSCOPY; WITH BIOPSY, SINGLE OR MULTIPLE;  Surgeon: Arnold Long Mir, MD;  Location: PEDS PROCEDURE ROOM [facility];  Service: Gastroenterology    PR UPPER GI ENDOSCOPY,BIOPSY N/A 08/27/2016    Procedure: UGI ENDOSCOPY; WITH BIOPSY, SINGLE OR MULTIPLE;  Surgeon: Arnold Long Mir, MD;  Location: PEDS PROCEDURE ROOM [facility];  Service: Gastroenterology    PR UPPER GI ENDOSCOPY,BIOPSY N/A 09/02/2017    Procedure: UGI ENDOSCOPY; WITH BIOPSY, SINGLE OR MULTIPLE;  Surgeon: Arnold Long Mir, MD;  Location: PEDS PROCEDURE ROOM [facility];  Service: Gastroenterology    PR UPPER GI ENDOSCOPY,BIOPSY N/A 03/13/2020    Procedure: UGI ENDOSCOPY; WITH BIOPSY, SINGLE OR MULTIPLE;  Surgeon: Helyn Numbers, MD;  Location: [facility];  Service: Gastroenterology    PR UPPER GI ENDOSCOPY,BIOPSY N/A 09/05/2021    Procedure: UGI ENDOSCOPY; WITH BIOPSY, SINGLE OR MULTIPLE;  Surgeon: Wendall Papa, MD;  Location: GI PROC[facility]URES [facility];  Service: Gastroenterology    PR UPPER GI ENDOSCOPY,DIAGNOSIS N/A 01/20/2022    Procedure: UGI ENDO, INCLUDE ESOPHAGUS, STOMACH, & DUODENUM &/OR JEJUNUM; DX W/WO COLLECTION SPECIMN, BY BRUSH OR WASH;  Surgeon: Andrey Farmer, MD;  [facility]: [facility]URES [facility];  Service: Gastroenterology    TUMOR REMOVAL      multiple-head, neck, back, hand, right flank, multiple     Family history:   Family History   Problem Relation Age of Onset    No Known Problems Mother     No Known Problems Father     No Known Problems Sister     No Known Problems Brother     Stroke Maternal Grandmother     Other Maternal Grandmother         benign lesions of liver and pancreas, further details unknown    Cancer Maternal Grandmother     Diabetes Maternal Grandmother     Hypertension Maternal Grandmother     Thyroid disease Maternal Grandmother     Arthritis Maternal Grandfather     Asthma Maternal Grandfather     COPD Paternal Grandmother         Deceased    Miscarriages / Stillbirths Paternal Grandmother     Alcohol abuse Paternal Grandfather         Deceased    No Known Problems Maternal Aunt     No Known Problems Maternal Uncle     No Known Problems Paternal Aunt     No Known Problems Paternal Uncle     Anesthesia problems Neg Hx     Broken bones Neg Hx     Cancer Neg Hx     Clotting disorder Neg Hx     Collagen disease Neg Hx     Diabetes Neg Hx     Dislocations Neg Hx     Fibromyalgia Neg Hx     Gout Neg Hx     Hemophilia Neg Hx  Osteoporosis Neg Hx     Rheumatologic disease Neg Hx     Scoliosis Neg Hx     Severe sprains Neg Hx     Sickle cell anemia Neg Hx     Spinal Compression Fracture Neg Hx     Melanoma Neg Hx     Basal cell carcinoma Neg Hx     Squamous cell carcinoma Neg Hx      Social history:   Social History     Socioeconomic History    Marital status: Single   Tobacco Use    Smoking status: Never     Passive exposure: Past    Smokeless tobacco: Never   Vaping Use    Vaping status: Never Used   Substance and Sexual Activity    Alcohol use: Never    Drug use: Never    Sexual activity: Never   Other Topics Concern    Do you use sunscreen? Yes    Tanning bed use? No    Are you easily burned? No    Excessive sun exposure? No    Blistering sunburns? 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     Social Determinants of Health     Financial Resource Strain: Low Risk  (09/26/2022)    Overall Financial Resource Strain (CARDIA)     Difficulty of Paying Living Expenses: Not very hard   Food Insecurity: No Food Insecurity (09/26/2022)    Hunger Vital Sign     Worried About Running Out of Food in the Last Year: Never true     Ran Out of Food in the Last Year: Never true   Transportation Needs: No Transportation Needs (12/10/2022)    PRAPARE - Therapist, art (Medical): No     Lack of Transportation (Non-Medical): No             Allergies:     Allergies   Allergen Reactions    Adhesive Tape-Silicones Hives and Rash     Paper tape and Tegederm ok. Biopatch causes blistering but has tolerated CHG Tegaderm    Ferrlecit [Sodium Ferric Gluconat-Sucrose] Swelling and Rash    Levofloxacin Swelling and Rash     Swelling in mouth, rash,     Methylnaltrexone      Per Patient: I lost bowel control, severe abdominal cramping, and elevated BP    Neomycin Swelling     Rxn after ear drops; ear swelling    Papaya Hives    Morphine Nausea And Vomiting    Zosyn [Piperacillin-Tazobactam] Itching and Rash     Red and itchy    Compazine [Prochlorperazine] Other (See Comments)     Extreme agitation    Iron Analogues     Iron Dextran Itching     Received iron dextran 06/08/22 over 12 hours, had itching and redness/flushing during the infusion and for a couple days after. Required IV benadryl w/flares between doses and ultimately treated w/IV methylpred for 2 days.     Latex, Natural Rubber Rash             Medications:     Current Outpatient Medications   Medication Sig Dispense Refill    acetaminophen (TYLENOL) 500 MG tablet Take 2 tablets (1,000 mg total) by mouth every eight (8) hours.      ammonium lactate (LAC-HYDRIN) 12 % lotion Apply 1 Application  topically two (2) times a day. 226 g 0    buprenorphine-naloxone (SUBOXONE) 2-0.5 mg sublingual film Place 0.75 Film (1.5 mg of buprenorphine total) under the tongue three (3) times a day. Fill on or after: 12/26/22 68 Film 0    cholecalciferol, vitamin D3 25 mcg, 1,000 units,, 1,000 unit (25 mcg) tablet Take 1 tablet (25 mcg total) by mouth daily. 100 tablet 3    clobetasoL (TEMOVATE) 0.05 % ointment Apply topically two (2) times a day. To stubborn/thick rashes on hands/feet ears until clear/smooth. Restart as needed 60 g 3    COURIERED MED OR SUPPLY OGSVEO 50 MG TABLET. 1 each 0    diazePAM (VALIUM) 5 MG tablet Take 0.5 tablets (2.5 mg total) by mouth two (2) times a day. 30 tablet 0    escitalopram oxalate (LEXAPRO) 5 MG tablet Take 1 tablet (5 mg total) by mouth daily. 30 tablet 2    famotidine (PEPCID) 20 MG tablet Take 1 tablet (20 mg total) by mouth nightly. 30 tablet 0    hydrocortisone 2.5 % cream Apply topically two (2) times a day as needed (hemorrhoids). 30 g 0    lidocaine (ASPERCREME) 4 % patch Place 2 patches on the skin daily. 60 patch 0    LORazepam (ATIVAN) 0.5 MG tablet Take 2 tablets (1 mg total) by mouth Three (3) times a day as needed for anxiety.      mirtazapine (REMERON) 15 MG tablet Take 1 tablet (15 mg total) by mouth nightly. 90 tablet 3    multivitamin with folic acid 400 mcg Tab tablet Take 1 tablet by mouth daily. 30 tablet 0    naloxegol (MOVANTIK) 12.5 mg tablet Take 1 tablet (12.5 mg total) by mouth daily. 30 tablet 0    naloxone (NARCAN) 4 mg nasal spray One spray in either nostril once for known/suspected opioid overdose. May repeat every 2-3 minutes in alternating nostril til EMS arrives 2 each 0    nirogacestat (OGSIVEO) 50 mg tablet Take 3 tablets (150 mg total) by mouth two (2) times a day. Swallow tablets whole; do not break, crush, or chew. 180 tablet 5    ondansetron (ZOFRAN-ODT) 8 MG disintegrating tablet Dissolve 1 tablet (8 mg total) in the mouth every twelve (12) hours as needed for nausea. 60 tablet 0    [START ON 01/01/2023] oxyCODONE (ROXICODONE) 10 mg immediate release tablet Take 1 tablet (10 mg total) by mouth every eight (8) hours as needed for pain. Fill on or after: 01/01/23 90 tablet 0    pantoprazole (PROTONIX) 40 MG tablet Take 1 tablet (40 mg total) by mouth two (2) times a day. 60 tablet 0    pimecrolimus (ELIDEL) 1 % cream Apply topically two (2) times a day as needed. To face as needed for rash      promethazine (PHENERGAN) 12.5 MG tablet Take 1 tablet (12.5 mg total) by mouth every six (6) hours as needed. 120 tablet 0    scopolamine (TRANSDERM-SCOP) 1 mg over 3 days Place 1 patch (1 mg total) on the skin every third day. 10 patch 0    triamcinolone (KENALOG) 0.1 % cream Apply topically two (2) times a day as needed. Apply to rash as needed      zinc oxide-cod liver oil (DESITIN 40%) 40 % Pste Apply topically daily as needed. 57 g 0     No current facility-administered medications for this visit.  Physical Exam:   There were no vitals taken for this visit.      BP Readings from Last 3 Encounters:   12/16/22 122/76   12/16/22 103/73   12/10/22 103/60      Wt Readings from Last 12 Encounters:   12/16/22 58.5 kg (129 lb)   12/16/22 58.5 kg (129 lb)   12/08/22 61.3 kg (135 lb 3.2 oz)   11/14/22 61 kg (134 lb 8 oz)   09/24/22 62.4 kg (137 lb 9.6 oz)   09/16/22 62 kg (136 lb 11.2 oz)   09/02/22 63 kg (139 lb)   08/05/22 62.1 kg (137 lb)   07/18/22 62.8 kg (138 lb 6.4 oz)   07/16/22 62.1 kg (137 lb)   07/14/22 62.9 kg (138 lb 9.6 oz)   06/15/22 63.6 kg (140 lb 3.4 oz)      BMI: Estimated body mass index is 21.47 kg/m?? as calculated from the following:    Height as of 12/16/22: 165.1 cm (5' 5).    Weight as of 12/16/22: 58.5 kg (129 lb).    BSA: Estimated body surface area is 1.64 meters squared as calculated from the following:    Height as of 12/16/22: 165.1 cm (5' 5).    Weight as of 12/16/22: 58.5 kg (129 lb).    Televisit          Labs, Data & Indices:     Lab Review:   No visits with results within 1 Week(s) from this visit.   Latest known visit with results is:   Appointment on 12/16/2022   Component Date Value Ref Range Status    Sodium 12/16/2022 139  135 - 145 mmol/L Final    Potassium 12/16/2022 3.4  3.4 - 4.8 mmol/L Final    Chloride 12/16/2022 104  98 - 107 mmol/L Final    CO2 12/16/2022 26.8  20.0 - 31.0 mmol/L Final    Anion Gap 12/16/2022 8  5 - 14 mmol/L Final    BUN 12/16/2022 6 (L)  9 - 23 mg/dL Final    Creatinine 16/12/9602 0.52 (L)  0.55 - 1.02 mg/dL Final    BUN/Creatinine Ratio 12/16/2022 12   Final    eGFR CKD-EPI (2021) Female 12/16/2022 >90  >=60 mL/min/1.75m2 Final    Glucose 12/16/2022 122  70 - 179 mg/dL Final    Calcium 54/11/8117 9.7  8.7 - 10.4 mg/dL Final    Albumin 14/78/2956 4.0  3.4 - 5.0 g/dL Final    Total Protein 12/16/2022 7.7  5.7 - 8.2 g/dL Final    Total Bilirubin 12/16/2022 0.2 (L)  0.3 - 1.2 mg/dL Final    AST 21/30/8657 13  <=34 U/L Final    ALT 12/16/2022 8 (L)  10 - 49 U/L Final    Alkaline Phosphatase 12/16/2022 82  46 - 116 U/L Final    Magnesium 12/16/2022 2.2  1.6 - 2.6 mg/dL Final    Free T4 84/69/6295 0.88 (L)  0.89 - 1.76 ng/dL Final    TSH 28/41/3244 1.171  0.550 - 4.780 uIU/mL Final    WBC 12/16/2022 9.8  3.6 - 11.2 10*9/L Final    RBC 12/16/2022 4.92  3.95 - 5.13 10*12/L Final    HGB 12/16/2022 13.3  11.3 - 14.9 g/dL Final    HCT 03/12/7251 39.7  34.0 - 44.0 % Final    MCV 12/16/2022 80.7  77.6 - 95.7 fL Final    MCH 12/16/2022 26.9  25.9 - 32.4 pg Final  MCHC 12/16/2022 33.4  32.0 - 36.0 g/dL Final    RDW 16/12/9602 15.2  12.2 - 15.2 % Final    MPV 12/16/2022 9.5  6.8 - 10.7 fL Final    Platelet 12/16/2022 287  150 - 450 10*9/L Final    Neutrophils % 12/16/2022 64.7  % Final    Lymphocytes % 12/16/2022 24.5  % Final    Monocytes % 12/16/2022 6.2  % Final    Eosinophils % 12/16/2022 4.0  % Final    Basophils % 12/16/2022 0.6  % Final    Absolute Neutrophils 12/16/2022 6.3  1.8 - 7.8 10*9/L Final    Absolute Lymphocytes 12/16/2022 2.4  1.1 - 3.6 10*9/L Final    Absolute Monocytes 12/16/2022 0.6  0.3 - 0.8 10*9/L Final    Absolute Eosinophils 12/16/2022 0.4  0.0 - 0.5 10*9/L Final    Absolute Basophils 12/16/2022 0.1  0.0 - 0.1 10*9/L Final     .............................................................................................................................................             HISTORY:     Brief Disease Course:    08/2020 colectomy for [name] 8/022 ileostomy takedown, complicated by postop ileus and inability to eat during stage 1 and 2 (need for TPN)   2023-2024 significant abdominal pain without a clear objective correlate, recurrent hospitalizations, need for TPN due to inability to keep nutrition up due to abdominal pain.   06/2022 hospitalization for abdominal pain, later on improvement and stop of TPN  07/2022 and 7-12/2022  hospitalization, later one for syncope, dystonia, muscle spasms, abdominal pain, GAD    Other Paraspinal and anterior chest desmoid tumors    Colectomy specimen:  Terminal ileum, appendix and colon, total colectomy:  -Tubular adenomas (at least 100), extending from the ileocecal valve to the rectum, with the majority located in the rectum. The adenomas range from 0.2 cm to 0.7 cm in greatest dimension.  -There is no evidence of high-grade dysplasia or malignancy.  -Mucosal margins are negative for dysplasia (5.2 cm from proximal, 0.1 cm from distal).  -The appendix and ileum are not involved.      Endoscopy:   Pouchoscopy 07/2022     Some small ulcers at suture line, treated with APC due to suspicion that these are the culprit for recurrent bleeding. Otherwise normal.       Pouchoscopy 03/2022  The examined portion of the ileum was normal.                         - Friability with contact bleeding in the ileoanal pouch only along suture lines, otherwise healthy                          pouch. Treated with argon beam coagulation.                         - Rectal cuff with healthy appearing mucosa seen.                         - No specimens collected.        Pouchoscopy 02/2022     Patient is status-post total colectomy with an ileal pouch-anal        anastomosis.       Anal fissures were found on anoscopy       The ileocolonic anastomosis contained a single (solitary)  circumferential ulcer, approximately 10mm in length. Oozing was present.        Biopsies were taken with a cold forceps for histology. Coagulation for        bleeding prevention using argon plasma at 0.5 liters/minute and 25 watts        was successful.       The exam was otherwise without abnormality.       A: Colon, pouch anastomosis, biopsy:  -Severely active pouchitis with ulceration, mild glandular architecture distortion and focal adenomatous change.    Pouchoscopy 01/2022:  Anal fissure found on perianal exam.                         - Stool in the ileoanal pouch and neo-terminal ileum.                         - The ileoanal pouch and neo-terminal ileum are normal.                         - A single (solitary) ulcer at the ileocolonic                          anastomosis.    Upper endoscopy 08/2021     Normal esophagus.                         - Multiple gastric polyps. Biopsied.                         - Normal stomach otherwise. Biopsied.                         - Normal examined duodenum. Biopsied.    A: Duodenum, biopsy  - Adenomatous polyp (1 fragment)  - No high grade dysplasia or carcinoma identified  - Separate fragments of duodenal mucosa with mild-moderate villous blunting  - No increased intraepithelial lymphocytes identified     B: Stomach, biopsy  - Gastric fundic mucosa with chronic superficial gastritis  - Gastric antral mucosa with erosive minimally active chronic superficial gastritis and slight reactive foveolar hyperplasia  - No Helicobacter pylori identified on H&E stain  - No intestinal metaplasia or dysplasia identified     C: Stomach, polypectomy  - Polypoid fragments of gastric fundic mucosa with features suggestive of early fundic gland polyps  - No intestinal metaplasia or dysplasia identified      Pouchoscopy 08/2021   The perianal and digital rectal examinations were normal.       The j-pouch, pouch inlet and rectal cuff appeared normal.       The exam was otherwise without abnormality, including no evidence of        mass of intussusception.                                                               Pouchoscopy 01/23/2021  Impression:            - Preparation of the colon was fair.                         -  The examined portion of the ileum was normal.                         - The ileoanal pouch is normal.                         - Rectal cuff with congestion and an intact staple                          line seen.                         - No specimens collected.    - Hegar dilation 17 - 20 mm        Imaging:  Multiple imaging studies reviewed in EPIC          Assessment & Recommendations:     Parissa Alonso is a 22 y.o. female who presents for follow up for multiple GI and other issues after pouch done for [name] .     Recent hospitalization for 2.5 months (after already several hospitalizations before). Overall the patient medical history and current presentation is based on a multifactorial  symptom complex, which I have problems to completely explain just based on the documented organic findings. Multiple teams are involved (pain management, oncology, in patient psychiatry, in patient nutrition, rehabilitation, neurology)    Pouchitis symptoms, abdominal distension: Overall stable, but currently more loose bowel movements due to liquid nutrition via Corpak. Patient is afraid of eating (and if she eats she tells me she eats a [employer]). Discussion with Dr. Carmon Sails (Nutrition) and Durenda Guthrie  to start nutritional counseling with aim to remove Corpak ASAP.       Abdominal pain: Multifactorial, exacerbated by anxiety, narcotic bowel syndrome and other factors.  On  buprenorphine . Patient said Movantik helps, which we will restart even in the current setting of more frequent bowel movements, since patient has already slow motility in general and worsening with her need for narcotic therapy.      Anxiety/nausea : On Lexapro, mirtazapine increased to 7.5 mg , in the past on Atarax. Discussed with Dr. [name] that follow-up with Adalberto Ill (psychologist) would be very helpful, since I think that many of the patient symptoms are worsened by GAD. Especially the dystonia is very difficult to explain.   .   Dystonia, inability to move: Need wheelchair during hospitalization. Reason for dystonia could not be evaluated. Neurology evaluated patient seemingly ruling out an acute dystonic reaction, cord compression, central process, nutritional deficiency, stiff person syndrome and [name] after normal EMG on 8/22. Continues to be well controlled with valium, small improvement with baclofen PRN.     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     Nutrition: Plan for nutrition counseling, currently on tube feeds and I am unable to figure out who provides and signs for tube feeds.     Fecal incontinence: Today no complaint    Iron deficiency anemia:Low normal ferritin levels, needs to be closely monitored.     Portions of this record have been created using Scientist, clinical (histocompatibility and immunogenetics). Dictation errors have been sought, but may not have been identified and corrected.     Decision-making today was high complexity on the basis of discussion of Gardner/desmoid tumors/pouchitis, chronic abdominal pain, and reviewing imaging/colonoscopy with future next steps.Discussion about further approach with DR. [name] and Dr. Carmon Sails. Plan  now for close follow-up to prevent re-hospitalization.       The patient reports they are physically located in [place] and is currently: at home. I conducted a audio/video visit. I spent  30 min  on the video call with the patient. I spent an additional 60 minutes on pre- and post-visit activities on the date of service .       PLAN:      Patient Instructions   I will talk to Dr. [name] about further plan  I will involve nutrition that we can wean off your enteral nutrition  We solved the Movantik problem. Restat Movantik as previously  Try to limit pain medicagtions.    Foillow-up early November currently: at home. I conducted a audio/video visit. I spent  0s on the video call with the patient. I spent an additional 15 minutes on pre- and post-visit activities on the date of service .       PLAN:      There are no Patient Instructions on file for this visit.

## 2022-12-25 ENCOUNTER — Telehealth
Admit: 2022-12-25 | Discharge: 2022-12-26 | Payer: PRIVATE HEALTH INSURANCE | Attending: Gastroenterology | Primary: Gastroenterology

## 2022-12-25 DIAGNOSIS — F419 Anxiety disorder, unspecified: Secondary | ICD-10-CM | POA: Diagnosis not present

## 2022-12-25 DIAGNOSIS — Q8789 Other specified congenital malformation syndromes, not elsewhere classified: Secondary | ICD-10-CM | POA: Diagnosis not present

## 2022-12-25 DIAGNOSIS — K91850 Pouchitis: Secondary | ICD-10-CM | POA: Diagnosis not present

## 2022-12-25 DIAGNOSIS — R1084 Generalized abdominal pain: Secondary | ICD-10-CM | POA: Diagnosis not present

## 2022-12-25 DIAGNOSIS — Z789 Other specified health status: Secondary | ICD-10-CM | POA: Diagnosis not present

## 2022-12-25 DIAGNOSIS — D485 Neoplasm of uncertain behavior of skin: Secondary | ICD-10-CM | POA: Diagnosis not present

## 2022-12-25 DIAGNOSIS — R109 Unspecified abdominal pain: Secondary | ICD-10-CM | POA: Diagnosis not present

## 2022-12-26 DIAGNOSIS — Q8789 Other specified congenital malformation syndromes, not elsewhere classified: Secondary | ICD-10-CM | POA: Diagnosis not present

## 2022-12-26 DIAGNOSIS — R109 Unspecified abdominal pain: Secondary | ICD-10-CM | POA: Diagnosis not present

## 2022-12-26 DIAGNOSIS — K91850 Pouchitis: Secondary | ICD-10-CM | POA: Diagnosis not present

## 2022-12-26 DIAGNOSIS — R1084 Generalized abdominal pain: Secondary | ICD-10-CM | POA: Diagnosis not present

## 2022-12-26 MED ORDER — BUPRENORPHINE 2 MG-NALOXONE 0.5 MG SUBLINGUAL FILM
ORAL_FILM | Freq: Three times a day (TID) | SUBLINGUAL | 0 refills | 30 days | Status: CP
Start: 2022-12-26 — End: ?

## 2022-12-26 NOTE — Unmapped (Signed)
Movantik approval needed, paperwork routed originally to inpatient team and not resolved. ePA pushed and approval now in place. Patient notified and will update if any problems filling at pharmacy  Prior authorization approved  Payer: [id number] Case ID: [id number]  Note from payer: Approved.  Approval Details    Authorized from December 26, 2022 to December 26, 2023  Electronic appeal: Not supported

## 2022-12-27 DIAGNOSIS — R109 Unspecified abdominal pain: Secondary | ICD-10-CM | POA: Diagnosis not present

## 2022-12-27 DIAGNOSIS — K91850 Pouchitis: Secondary | ICD-10-CM | POA: Diagnosis not present

## 2022-12-27 DIAGNOSIS — Q8789 Other specified congenital malformation syndromes, not elsewhere classified: Secondary | ICD-10-CM | POA: Diagnosis not present

## 2022-12-27 DIAGNOSIS — R1084 Generalized abdominal pain: Secondary | ICD-10-CM | POA: Diagnosis not present

## 2022-12-27 NOTE — Unmapped (Addendum)
I will talk to Dr. [name] about further plan  I will involve nutrition that we can wean off your enteral nutrition  E have top figure out who prescribed the enteral feeding and how we can optimize it and wean off  We solved the Movantik problem. Restart Movantik as previously  Try to limit pain medications.    Follow-up early November    Long term plan: you should be able to eat normally again and manage the pain better  and we will help you to make this happen.

## 2022-12-28 DIAGNOSIS — R109 Unspecified abdominal pain: Secondary | ICD-10-CM | POA: Diagnosis not present

## 2022-12-28 DIAGNOSIS — R1084 Generalized abdominal pain: Secondary | ICD-10-CM | POA: Diagnosis not present

## 2022-12-28 DIAGNOSIS — Q8789 Other specified congenital malformation syndromes, not elsewhere classified: Secondary | ICD-10-CM | POA: Diagnosis not present

## 2022-12-28 DIAGNOSIS — K91850 Pouchitis: Secondary | ICD-10-CM | POA: Diagnosis not present

## 2022-12-28 LAB — DRUG SCREEN, PAIN CLINIC, URINE
2-HYDROXY ETHYL FLURAZEPAM, UR: NOT DETECTED ng/mL
6-MONOACETYLMORPHINE, UR: NOT DETECTED ng/mL
7-AMINOCLONAZEPAM, UR: NOT DETECTED ng/mL
7-AMINOFLUNITRAZEPAM, UR: NOT DETECTED ng/mL
ALPHA-HYDROXY MIDAZOLAM, UR: NOT DETECTED ng/mL
ALPHA-HYDROXY TRIAZOLAM, UR: NOT DETECTED ng/mL
ALPHA-HYDROXYALPRAZOLAM GLUCURONIDE, UR: NOT DETECTED ng/mL
ALPRAZOLAM, UR: NOT DETECTED ng/mL
AMPHETAMINE, UR: NOT DETECTED ng/mL
BUPRENORPHINE, UR: NOT DETECTED ng/mL
CHLORDIAZEPOXIDE, UR: NOT DETECTED ng/mL
CLOBAZAM, UR: NOT DETECTED ng/mL
CLONAZEPAM, UR: NOT DETECTED ng/mL
COCAINE METABOLITES URINE: NEGATIVE ng/mL
CODEINE, UR: NOT DETECTED ng/mL
CODEINE-6-BETA-GLUCURONIDE, UR: NOT DETECTED ng/mL
COMMENT,URINE: NORMAL
CREATININE-O-ADULT: 560.8 mg/dL
DIAZEPAM, UR: NOT DETECTED ng/mL
DIHYDROCODEINE, UR: NOT DETECTED ng/mL
EDDP, UR: NOT DETECTED ng/mL
EPHEDRINE, UR: NOT DETECTED ng/mL
FLUNITRAZEPAM, UR: NOT DETECTED ng/mL
FLURAZEPAM, UR: NOT DETECTED ng/mL
HYDROCODONE, UR: NOT DETECTED ng/mL
HYDROMORPHONE, UR: NOT DETECTED ng/mL
HYDROMORPHONE-3-BETA-GLUCURONIDE, UR: NOT DETECTED ng/mL
LORAZEPAM GLUCURONIDE, UR: NOT DETECTED ng/mL
LORAZEPAM, UR: NOT DETECTED ng/mL
MDA, UR: NOT DETECTED ng/mL
MDEA, UR: NOT DETECTED ng/mL
MDMA, UR: NOT DETECTED ng/mL
MEPERIDINE, UR: NOT DETECTED ng/mL
METHADONE, UR: NOT DETECTED ng/mL
METHAMPHETAMINE, UR: NOT DETECTED ng/mL
METHYLPHENIDATE, UR: NOT DETECTED ng/mL
MIDAZOLAM, UR: NOT DETECTED ng/mL
MORPHINE, UR: NOT DETECTED ng/mL
MORPHINE-6-BETA-GLUCURONIDE, UR: NOT DETECTED ng/mL
N-DESMETHYLCLOBAZAM, UR: NOT DETECTED ng/mL
NALOXONE, UR: NOT DETECTED ng/mL
NORFENTANYL, UR: NOT DETECTED ng/mL
NORHYDROCODONE, UR: NOT DETECTED ng/mL
NORMEPERIDINE, UR: NOT DETECTED ng/mL
OXIDANTS-O-ADULT: NEGATIVE
PH-O-ADULT: 6.1
PHENCYCLIDINE, UR: NOT DETECTED ng/mL
PHENTERMINE, UR: NOT DETECTED ng/mL
PRAZEPAM, UR: NOT DETECTED ng/mL
PROPOXYPHENE, UR: NOT DETECTED ng/mL
PSEUDOEPHEDRINE, UR: NOT DETECTED ng/mL
RITALINIC ACID, UR: NOT DETECTED ng/mL
SPECIFIC GRAVITY-O-ADULT: 1.031
TAPENTADOL, UR: NOT DETECTED ng/mL
TEMAZEPAM, UR: NOT DETECTED ng/mL
THC, SCREEN URINE: NEGATIVE ng/mL
TRAMADOL, UR: NOT DETECTED ng/mL
TRIAZOLAM, UR: NOT DETECTED ng/mL
URINE BARBITURATES MAYO: NEGATIVE ng/mL
ZOLPIDEM PHENYL-4-CARBOXYLIC ACID, UR: NOT DETECTED ng/mL
ZOLPIDEM, UR: NOT DETECTED ng/mL

## 2022-12-28 IMAGING — DX DG CHEST 1V PORT
1 series · 1 of 1 positions shown · non-contrast
Comparison: None.

CLINICAL DATA: Confirm PICC positioning. Chest line, evaluate
placement.

EXAM:
PORTABLE CHEST 1 VIEW

[chest ap]
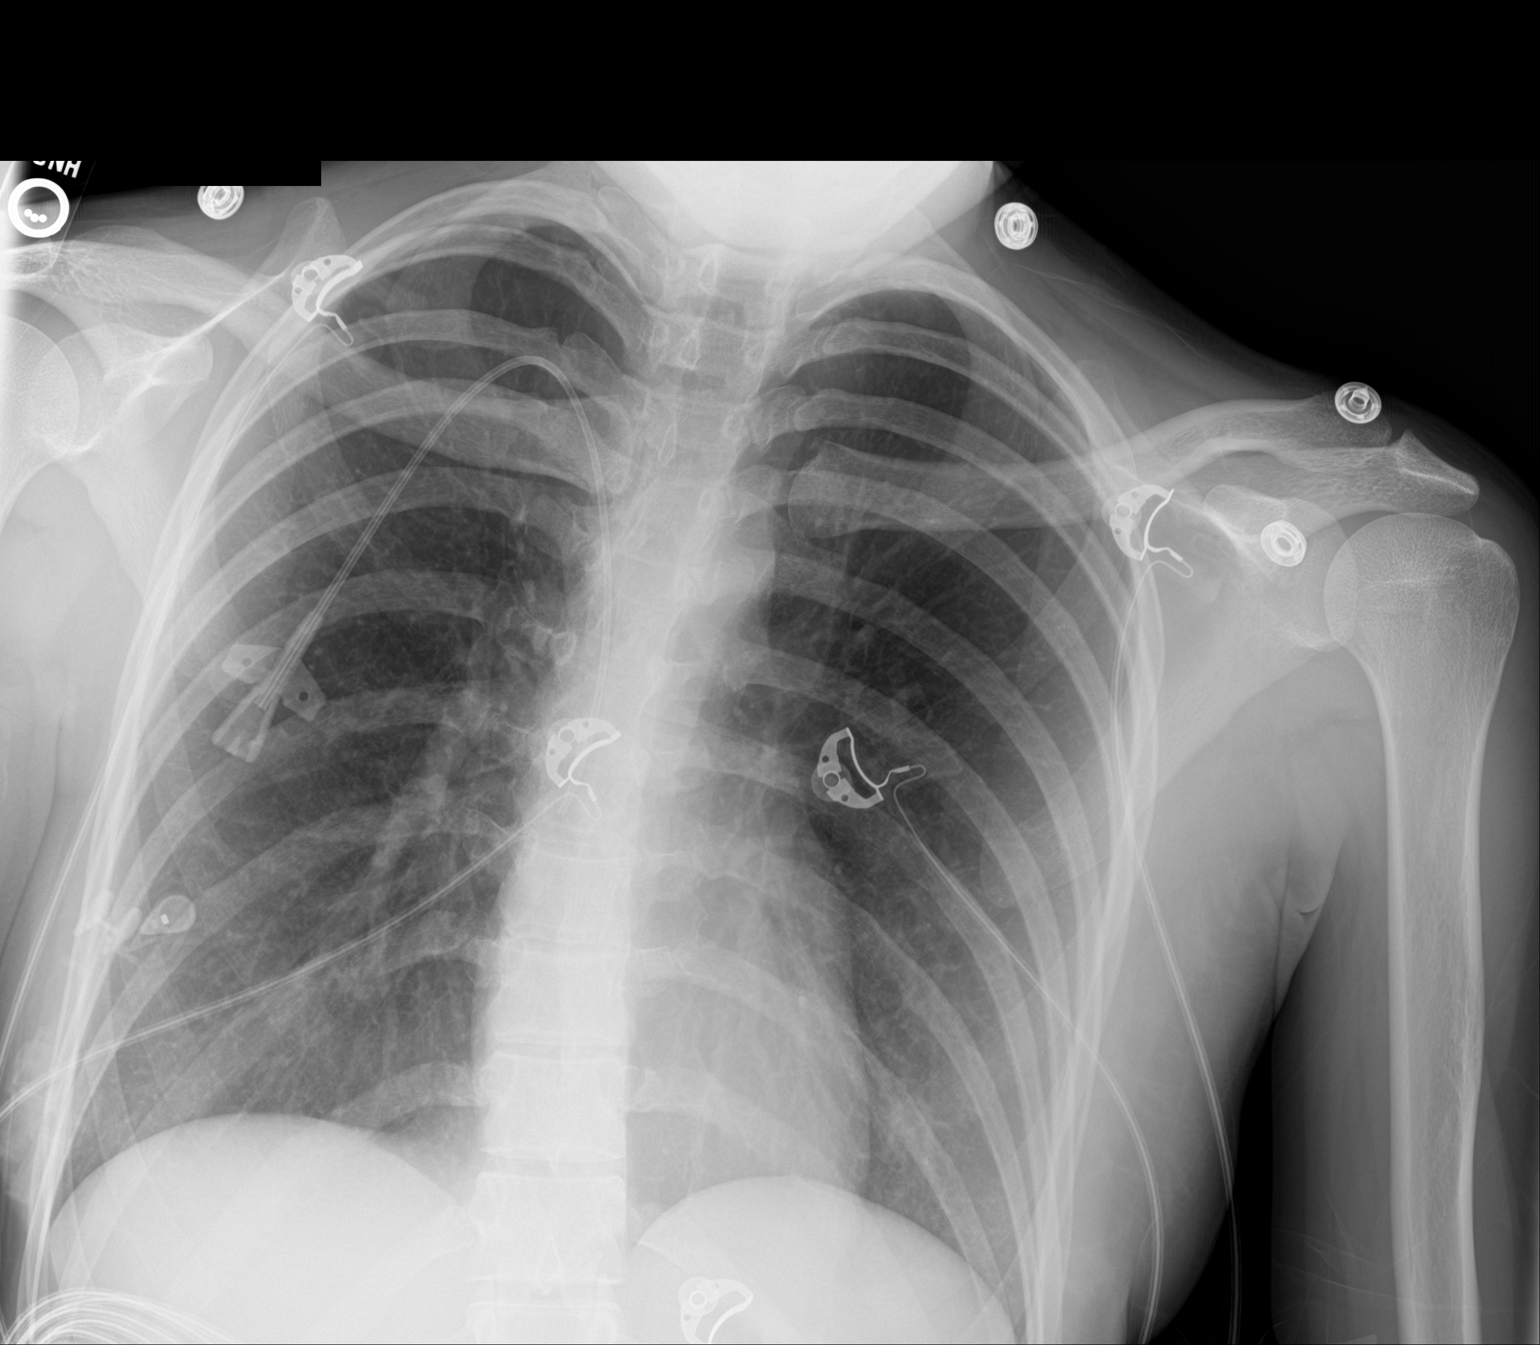

[1 of 1 positions shown; findings below may reference images not displayed]

FINDINGS: Right central line is likely an internal jugular line, with tip
overlying the mid SVC. No other central line is seen. Normal heart
size and mediastinal contours. No focal airspace disease,
pneumothorax, or pulmonary edema. No pleural effusion. No acute
osseous abnormalities are seen. Broad-based curvature of the
thoracic spine may be positioning or scoliosis.
IMPRESSION: 1. Right central line with tip overlying the mid SVC.
2. Clear lungs.

## 2022-12-28 IMAGING — CT CT ANGIO CHEST
2 of 6 series · 18 of 36 positions shown · IV contrast (OMNIPAQUE 350)
Comparison: None.

CLINICAL DATA: PE suspected. High probability. Recent abdominal
surgery.

EXAM:
CT ANGIOGRAPHY CHEST WITH CONTRAST
TECHNIQUE: Multidetector CT imaging of the chest was performed using the
standard protocol during bolus administration of intravenous
contrast. Multiplanar CT image reconstructions and MIPs were
obtained to evaluate the vascular anatomy.
CONTRAST:  75mL OMNIPAQUE IOHEXOL 350 MG/ML SOLN

[Series 5: thins · axial · 0.65mm/px · z∈[-334,-64]mm · 17 of 304 slices shown]
[im 17/304  lung]
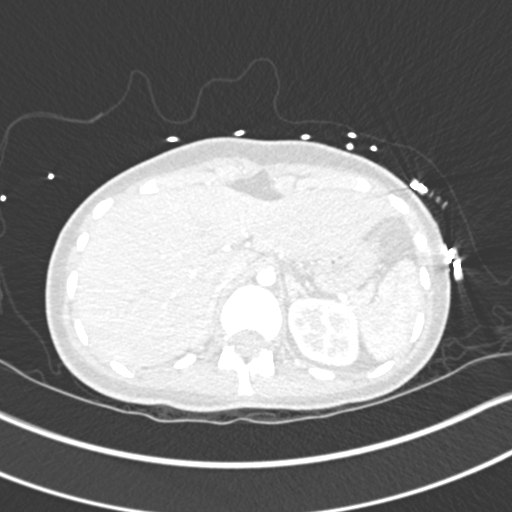
[im 34/304  mediastinal]
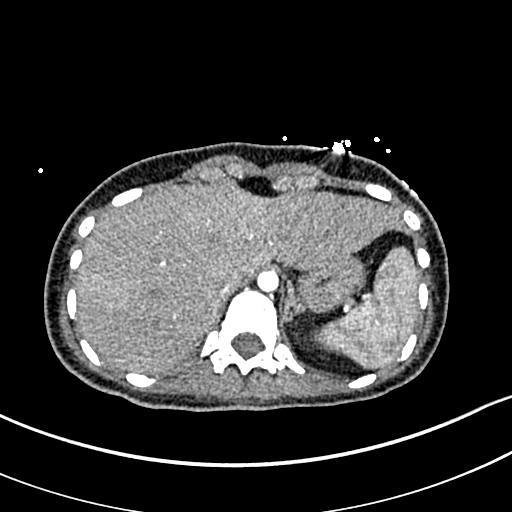
[im 51/304  lung]
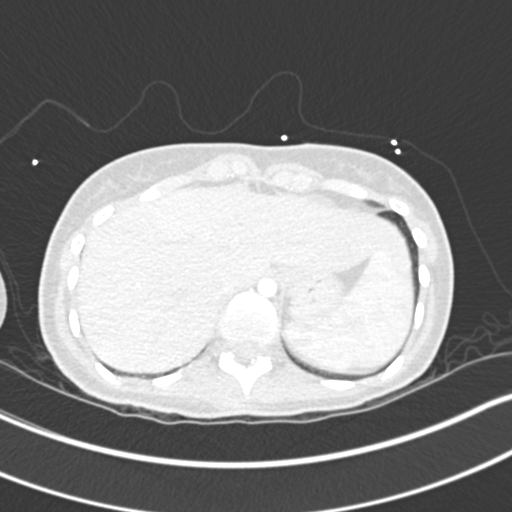
[im 68/304  mediastinal]
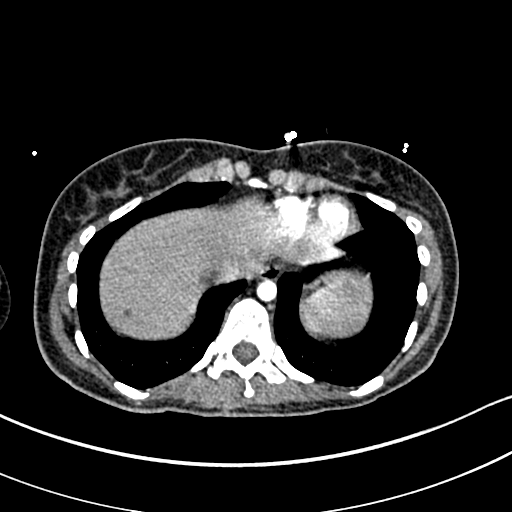
[im 85/304  lung]
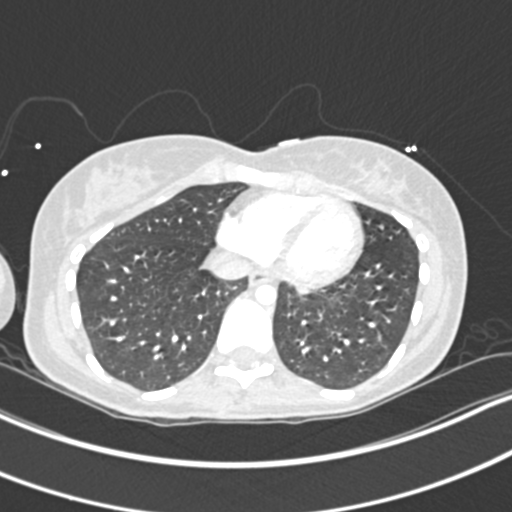
[im 102/304  mediastinal]
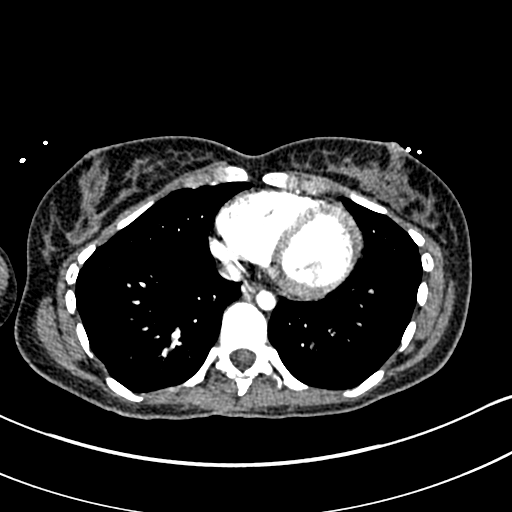
[im 118/304  lung]
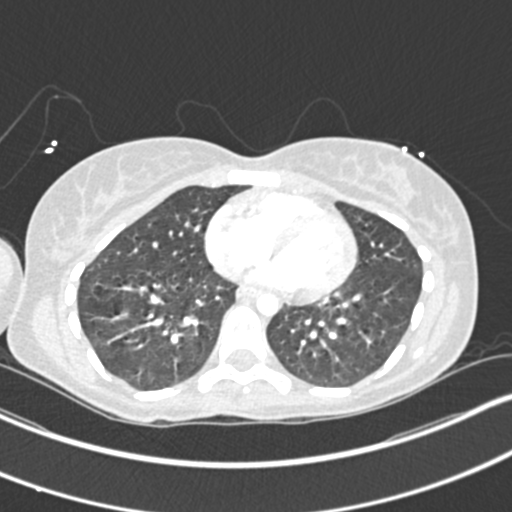
[im 135/304  mediastinal]
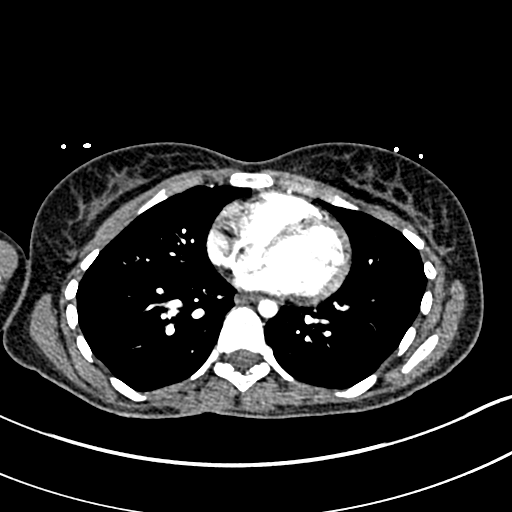
[im 152/304  lung]
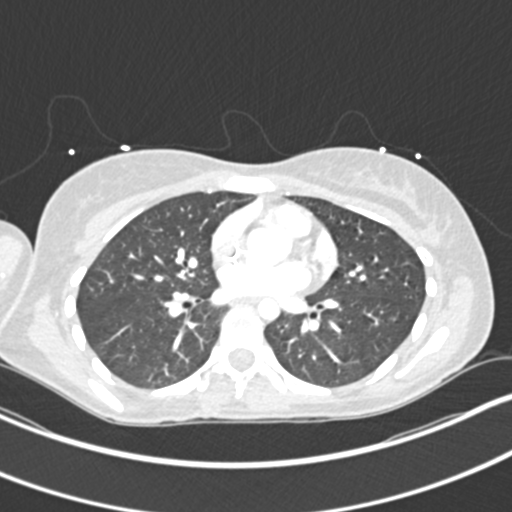
[im 169/304  mediastinal]
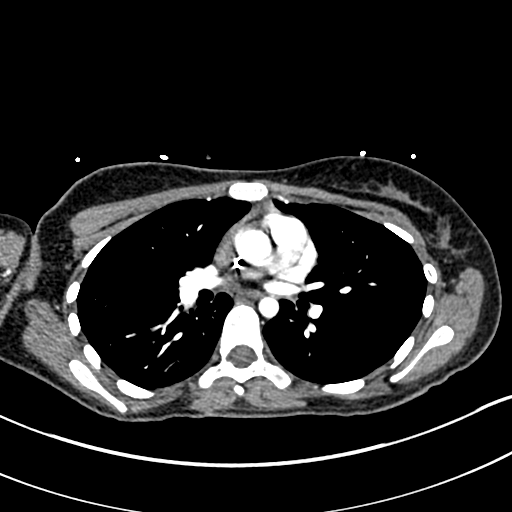
[im 186/304  lung]
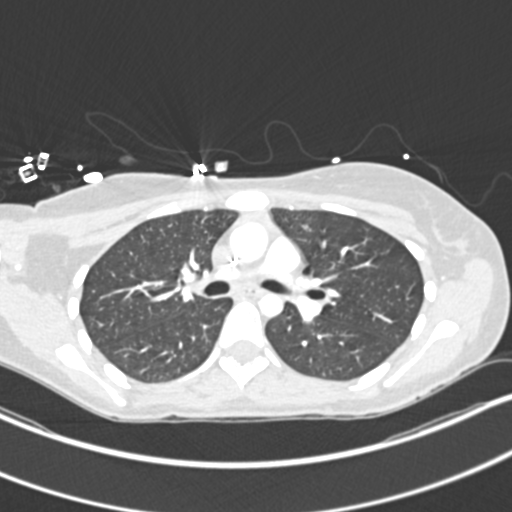
[im 203/304  mediastinal]
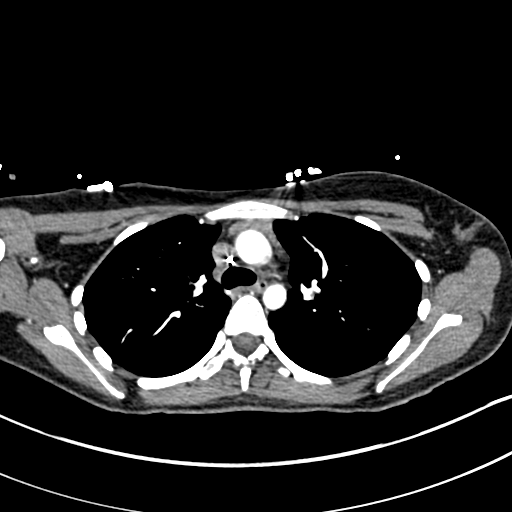
[im 219/304  lung]
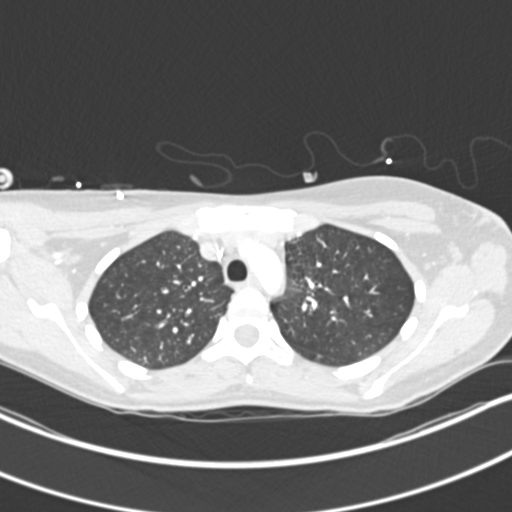
[im 236/304  mediastinal]
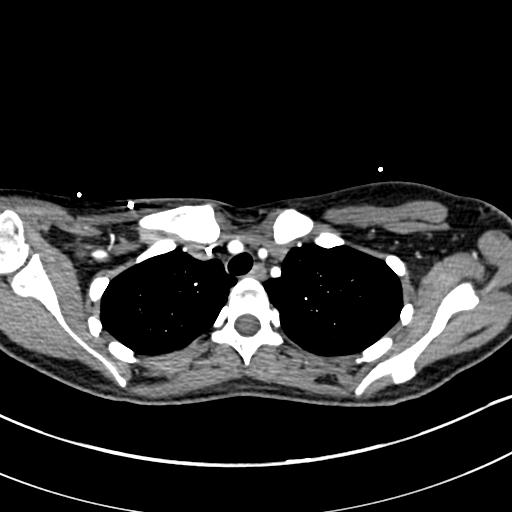
[im 253/304  lung]
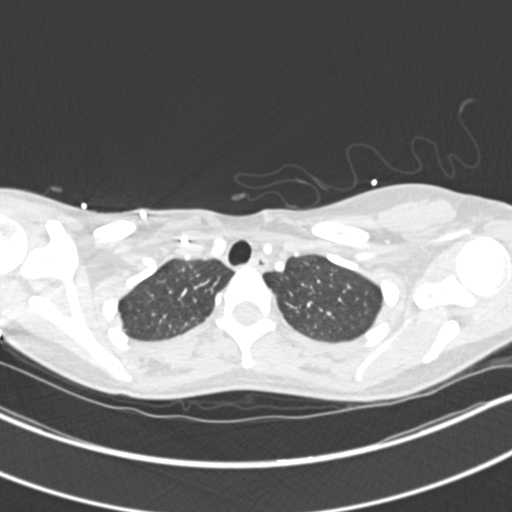
[im 270/304  mediastinal]
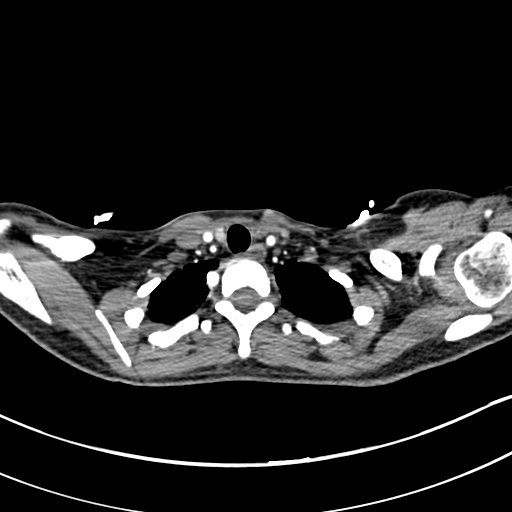
[im 287/304  lung]
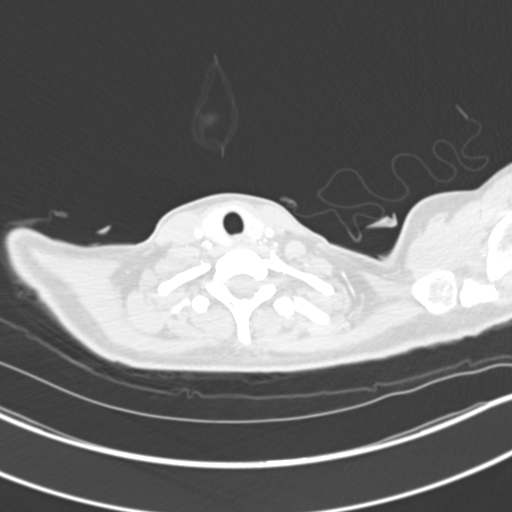

[Series 7: coronal mpr · coronal · 0.61mm/px · 1 of 103 slices shown]
[im 52/103  mediastinal]
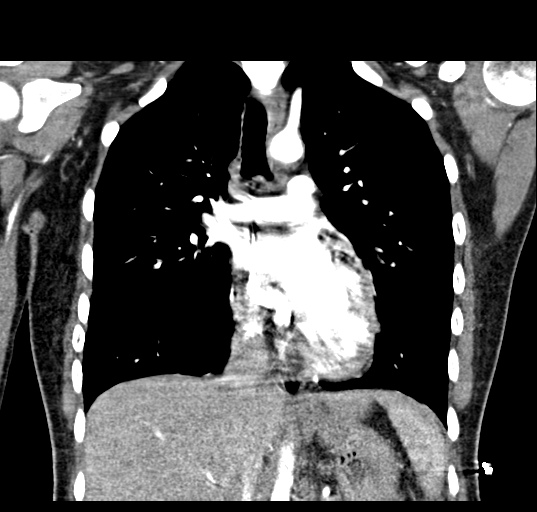

[18 of 36 positions shown; findings below may reference images not displayed]

FINDINGS: Cardiovascular: No filling defects within the pulmonary arteries to
suggest acute pulmonary embolism. No pericardial effusion.

Central venous line with tip in the distal SVC.  Its 20

Mediastinum/Nodes: No axillary or supraclavicular adenopathy. No
mediastinal or hilar adenopathy. No pericardial fluid. Esophagus
normal.

Lungs/Pleura: No pulmonary infarction. No pneumonia. No pleural
fluid. No pneumothorax.

Upper Abdomen: Limited view of the liver, kidneys, pancreas are
unremarkable. Normal adrenal glands.

Musculoskeletal: No acute osseous abnormality.

Review of the MIP images confirms the above findings.
IMPRESSION: 1. No pulmonary embolism.
2. No acute pulmonary parenchymal findings.

## 2022-12-29 DIAGNOSIS — K91850 Pouchitis: Secondary | ICD-10-CM | POA: Diagnosis not present

## 2022-12-29 DIAGNOSIS — R1084 Generalized abdominal pain: Secondary | ICD-10-CM | POA: Diagnosis not present

## 2022-12-29 DIAGNOSIS — Q8789 Other specified congenital malformation syndromes, not elsewhere classified: Secondary | ICD-10-CM | POA: Diagnosis not present

## 2022-12-29 DIAGNOSIS — R109 Unspecified abdominal pain: Secondary | ICD-10-CM | POA: Diagnosis not present

## 2022-12-30 ENCOUNTER — Telehealth
Admit: 2022-12-30 | Discharge: 2022-12-31 | Payer: PRIVATE HEALTH INSURANCE | Attending: Registered" | Primary: Registered"

## 2022-12-30 DIAGNOSIS — K91850 Pouchitis: Secondary | ICD-10-CM | POA: Diagnosis not present

## 2022-12-30 DIAGNOSIS — R109 Unspecified abdominal pain: Secondary | ICD-10-CM | POA: Diagnosis not present

## 2022-12-30 DIAGNOSIS — Q8789 Other specified congenital malformation syndromes, not elsewhere classified: Secondary | ICD-10-CM | POA: Diagnosis not present

## 2022-12-30 DIAGNOSIS — R1084 Generalized abdominal pain: Secondary | ICD-10-CM | POA: Diagnosis not present

## 2022-12-30 NOTE — Unmapped (Signed)
[facility] Outpatient Nutrition Services   Medical Nutrition Therapy Consultation       Visit Type:    Return Assessment    Referral Reason:   [name]       [name] [name] is a 22 y.o. female seen for medical nutrition therapy. Her active problem list, medication list, allergies, and lab results were reviewed.     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     Anthropometrics   Height: 165.1 cm (5' 5)   Weight: 58.5 kg (129 lb) (last documented weight 12/16/22); on 12/30/22, patient reports that she is now about 127 lb at home   Body mass index is 21.47 kg/m??.    Wt Readings from Last 5 Encounters:   12/16/22 58.5 kg (129 lb)   12/16/22 58.5 kg (129 lb)   12/08/22 61.3 kg (135 lb 3.2 oz)   11/14/22 61 kg (134 lb 8 oz)   09/24/22 62.4 kg (137 lb 9.6 oz)        Usual body weight: patient states that she was around 140 lb when on TPN, but that she was told that it was probably fluid weight (states she had pitting edema); reports that she was about 133 lb when she left the hospital and that she is about 127 lb now (states that she does not observe any swelling now); states that she likes to stay around 120 lb range in general   Ideal Body Weight: 56.75 kg  Weight in (lb) to have BMI = 25: 149.9    Nutrition Risk Screening:   Food Insecurity: No Food Insecurity (09/26/2022)    Hunger Vital Sign     Worried About Running Out of Food in the Last Year: Never true     Ran Out of Food in the Last Year: Never true        Nutrition Focused Physical Exam:    Nutrition Evaluation  Overall Impressions: Unable to perform Nutrition-Focused Physical Exam at this time due to (comment) (video visit) (01/01/23 1341)  Nutrition Designation: Normal weight (BMI 18.50 - 24.99 kg/m2) (01/01/23 1341)     Malnutrition Screening:   Unable to complete Malnutrition Assessment at this time due to (comment) (video visit limiting NFPE) (01/01/23 1341)    Biochemical Data, Medical Tests and Procedures:  All pertinent labs and imaging reviewed by Helen Hashimoto, RD/LDN at 2:46 PM 01/01/2023.    01/20/22- upper endoscopy  Impression:            - No evidence of blood or source of hematochezia seen                          on upper endoscopy- Normal esophagus.                         - Multiple gastric polyps. Polyps have previously been                          biopsied and consistent with adenomas                         -  Normal examined duodenum.                         - Normal examined jejunum.                         - No specimens collected.    08/05/22- pouch exam  Impression:            - The examined portion of the ileum was normal.                         - The ileoanal pouch is overall normal.                         - Rectal cuff with healthy appearing mucosa seen.                          Treated with argon beam coagulation.    11/11/22- CT pelvis w contrast  Impression:   Sequelae of proctocolectomy with ileoanal anastomosis with redemonstrated moderate dilatation of the proximal aspect of the ileoanal anastomosis, which appears less prominent compared to the prior study.      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      Redemonstrated ill-defined soft tissue mass within the lower central mesentery with associated tethering to adjacent small bowel loops, compatible with known desmoid tumor.      Distended urinary bladder, which is otherwise unremarkable in appearance. Correlate clinically for signs of urinary retention.     11/24/22- SLP documentation: Recommendations: PO Diet, Consider alternative nutrition  Diet Liquids Recommendations: Thin Liquids, Level 0, No Restrictions  Diet Solids Recommendation: Regular Consistency Solids  Recommended Form of Medications: Whole, With puree (per pt preference)  Recommended Compensatory Techniques : Slow rate, Small sips/bites, Upright 90 degrees, HOB>30 degrees at all times, Monitor signs of aspiration, Alternate liquids and solids, Feed only when alert/awake, Endurance may be limited: give small meals and snacks, Upright 30 min after meal  Risk for Aspiration: Mild    12/12/22- DG Naso G Tube Plc W/FI W/Rad  IMPRESSION:   Successful repositioning of nasoenteric tube to the duodenum.   Okay for postpyloric use.     10/19/22- zinc 55 mcg/dl, copper WNL, vitamin E WNL, 25OH vitamin D WNL, folate WNL, vitamin B6 WNL, niacin WNL, vitamin B2 WNL, vitamin B1 298 nmol/L (high)  12/04/22- ferritin WNL, iron 42 micrograms/dl, TIBC 161.0 micrograms/dl, transferrin 960 mg/dl  4/54/09- phosphorous WNL  12/16/22- magnesium WNL, sodium WNL, potassium WNL, eGFR WNL, BG WNL    Medications and Vitamin/Mineral Supplementation:   All nutritionally pertinent medications reviewed on 01/01/2023.     Nutritionally pertinent medications include: cholecalciferol, Ogsiveo, famotidine, mirtazapine, multivitamin with folic acid, Movantik, ondansetron, oxycodone, pantoprazole, promethazine, scopolamine    She is not currently taking nutrition supplements. Patient states that she is supposed to be taking a multivitamin but didn't take it the first week she was home and forgot to start it after that. Notes that she is not currently taking vitamin D listed in chart.     Current Outpatient Medications   Medication Sig Dispense Refill    acetaminophen (TYLENOL) 500 MG tablet Take 2 tablets (1,000 mg total) by mouth every eight (8) hours.      ammonium lactate (  LAC-HYDRIN) 12 % lotion Apply 1 Application topically two (2) times a day. 226 g 0 buprenorphine-naloxone (SUBOXONE) 2-0.5 mg sublingual film Place 0.75 Film (1.5 mg of buprenorphine total) under the tongue three (3) times a day. Fill on or after: 12/26/22 68 Film 0    cholecalciferol, vitamin D3 25 mcg, 1,000 units,, 1,000 unit (25 mcg) tablet Take 1 tablet (25 mcg total) by mouth daily. 100 tablet 3    clobetasoL (TEMOVATE) 0.05 % ointment Apply topically two (2) times a day. To stubborn/thick rashes on hands/feet ears until clear/smooth. Restart as needed 60 g 3    COURIERED MED OR SUPPLY OGSVEO 50 MG TABLET. 1 each 0    diazePAM (VALIUM) 5 MG tablet Take 0.5 tablets (2.5 mg total) by mouth two (2) times a day. 30 tablet 0    escitalopram oxalate (LEXAPRO) 5 MG tablet Take 1 tablet (5 mg total) by mouth daily. 30 tablet 2    famotidine (PEPCID) 20 MG tablet Take 1 tablet (20 mg total) by mouth nightly. 30 tablet 0    hydrocortisone 2.5 % cream Apply topically two (2) times a day as needed (hemorrhoids). 30 g 0    lidocaine (ASPERCREME) 4 % patch Place 2 patches on the skin daily. 60 patch 0    LORazepam (ATIVAN) 0.5 MG tablet Take 2 tablets (1 mg total) by mouth Three (3) times a day as needed for anxiety.      mirtazapine (REMERON) 15 MG tablet Take 1 tablet (15 mg total) by mouth nightly. 90 tablet 3    multivitamin with folic acid 400 mcg Tab tablet Take 1 tablet by mouth daily. 30 tablet 0    naloxegol (MOVANTIK) 12.5 mg tablet Take 1 tablet (12.5 mg total) by mouth daily. 30 tablet 0    naloxone (NARCAN) 4 mg nasal spray One spray in either nostril once for known/suspected opioid overdose. May repeat every 2-3 minutes in alternating nostril til EMS arrives 2 each 0    nirogacestat (OGSIVEO) 50 mg tablet Take 3 tablets (150 mg total) by mouth two (2) times a day. Swallow tablets whole; do not break, crush, or chew. 180 tablet 5    ondansetron (ZOFRAN-ODT) 8 MG disintegrating tablet Dissolve 1 tablet (8 mg total) in the mouth every twelve (12) hours as needed for nausea. 60 tablet 0 oxyCODONE (ROXICODONE) 10 mg immediate release tablet Take 1 tablet (10 mg total) by mouth every eight (8) hours as needed for pain. Fill on or after: 01/01/23 90 tablet 0    pantoprazole (PROTONIX) 40 MG tablet Take 1 tablet (40 mg total) by mouth two (2) times a day. 60 tablet 0    pimecrolimus (ELIDEL) 1 % cream Apply topically two (2) times a day as needed. To face as needed for rash      promethazine (PHENERGAN) 12.5 MG tablet Take 1 tablet (12.5 mg total) by mouth every six (6) hours as needed. 120 tablet 0    scopolamine (TRANSDERM-SCOP) 1 mg over 3 days Place 1 patch (1 mg total) on the skin every third day. 10 patch 0    triamcinolone (KENALOG) 0.1 % cream Apply topically two (2) times a day as needed. Apply to rash as needed      zinc oxide-cod liver oil (DESITIN 40%) 40 % Pste Apply topically daily as needed. 57 g 0     No current facility-administered medications for this visit.       Nutrition History:     Dietary Restrictions:  patient states that she avoided corn, popcorn, nuts and generally limits high fiber foods, as she was told to avoid these when she had temporary ileostomy and never heard different for J pouch. Patient notes that every time she eats chicken it makes her feel nauseated and sends her GI system into pain. States that she avoids bacon, because it makes her whole GI system itch. States that lettuce gives her GI bleeds, so avoids salads and will only eat lettuce in small amounts.       Gastrointestinal Issues: good amount of nausea on oral chemo per patient; states that she will do everything not to throw up. Reports 4-5 liquid BM per day, which she attributes to not eating much solid food. Also notes sensation of burning throat, which she notes is hard to tell if it is from reflux or chemo. States that tumors tethered to her bowel make eating painful and that she has had lifelong trouble with eating. Reports GI bleeds on and off.     Hunger and Satiety: limited appetite after not eating much PO for a long time per patient (stomach shrunk)     Food Safety and Access: She did not report issues.     Diet Recall:   Time Intake- yesterday 12/29/22   Breakfast    Snack (AM)    Lunch Ate 3/4 of a half of a [place] and cheese croissant and 1 cup diced watermelon   Snack (PM)    Dinner 1/2 mini bagel with cream cheese and more watermelon    Snack (HS) Nothing else yesterday per patient      Food-Related History:    Snacks: sometimes will drink a Pediasure, as she doesn't like Ensure; does also like Fairlife shake that has 26 grams protein     Beverages: reports that fluid intake improving in recent days (was barely peeing at all); states that she has been having about two 8 oz cups of lactose-free milk per day and about 16-20 oz tea, sweet tea, or water PO per day.     Other PO Nutrition Notes: patient reports that she feels sick with intake of fluids or food. States that she didn't eat much PO at all until yesterday and the night before. May have some sweet potato, baked potato, pasta, scrambled egg, applesauce. Had one beef soft taco with shredded lettuce, sour cream, and tomato the other night. States that she has not been doing well with red sauces due to throat burning.     Eating Behaviors:  Patient states that it takes her a long time to eat meals.Meals tend to be small per patient.     Enteral Nutrition History:     Patient reports that she did not have enteral supplies when she first got discharged, so missed fees for about a week until the feeding pump showed up. Initially did a few days where she infused three bottles of formula to catch up. She notes that she feels she has been tolerating feeds better since nasoenteric tube was advanced to duodenum. They report that they think [facility] is providing enteral supplies. They report that they have about 10 containers of formula left at present.     EN REGIMEN   Enteral Access: ND  Formula: Osmolite 1.5  Volume: 2 containers per day (474 ml total)   Rate: usually 50-60 ml/hr, 75 ml/hr at most  Time: tends to infuse in the evening; yesterday did feeds from 4 pm to 11 pm per report  Flushes: 40 ml every two hours during feeds per mom; typically total of 150 ml in flushes per day per mom   Modular: Prosource No Carb packets (has not used since being home per report, but still has some from admission)     Provides (excluding modular): 474 ml formula, 710 kcal, 30 grams protein, 96 grams carbohydrate, 0 grams fiber, 362 ml free water from formula.     Per product website, 1000 mL formula provides at least 100% of the RDIs for 25 essential vitamins and minerals (patient taking in 47% of this volume with two cartons of Osmolite 1.5 per day).    Physical Activity: not addressed this visit     Daily Estimated Nutritional Needs:  Energy: ~2956-2130 kcals [25-30 kcal/kg using last recorded weight, 58.5 kg (01/01/23 1530)]  Protein: ~70-88 gm [1.2-1.5 gm/kg using last recorded weight, 58.5 kg (01/01/23 1530)]  Carbohydrate:   [no restriction]  Fluid:   [per MD team]    Nutrition Goals & Evaluation      Patient will meet estimated nutritional needs via PO intakes, with goal of discontinuing EN per discussions with GI team  (New)  Patient will prevent unintentional loss of dry weight  (New)  Patient will implement low fiber nutrition therapy principles as tolerated  (New)  Patient will implement high calorie/high protein PO nutrition strategies as tolerated  (New)  Patient will keep record of PO intake to better quantify progress  (New)    Nutrition goals reviewed, relevant barriers identified and addressed: none evident. She is evaluated to have good to fair willingness and ability to achieve nutrition goals.     Nutrition Assessment       Patient seen via video on 12/30/22; patient's mom reported to be off camera and contributed to discussion. Patient recently discharged from long hospital stay (mid-July to early October 2024 per GI note) with ND tube and supplemental enteral feeds (was previously on TPN). Per correspondence with GI (Dr. Carmon Sails and Dr. [name]), goal is to progress PO intake and ultimately discontinue EN. Discussed PO strategies for increasing intake and foods that may be better tolerated (e.g. low fiber, blander), with handouts provided for guidance as outlined below. Patient agreed to keep a detailed record of PO intake for the three days prior to next scheduled visit to better quantify adequacy of oral intake. RD additionally spoke with Brynda Rim (nurse representative from [facility], contact info provided by patient) regarding logistics of enteral nutrition order and communicated findings to GI team, in the event supplies need to be replenished prior to discontinuation of EN. Patient was encouraged to reach out as needed between visits for additional support. Patient additionally agreed to being checked on by oncology RD (if she is available to see patient) during her future visits with oncology, given that she is also receiving chemotherapy for desmoid tumors.     Nutrition Intervention      - Nutrition Education: low fiber, high protein, high kcal diet. Education resource(s) or methods provided:: Handouts promoting nutrition intervention  Lists of foods recommended and foods not recommended After visit summary with patient instructions  Contact information   - Nutrition Counseling: Goal setting Problem solving  - Care Coordination: Collaboration with other nutrition professionals   Collaboration with other provider(s): Dr. Carmon Sails, Dr. [name], Jobe Gibbon ([facility]), Victorino Dike Spring (oncology RD)  - Enteral Nutrition    Nutrition Plan:   Continue Osmolite 1.5 via ND as tolerated, 2 cartons per day at 50-75 ml/hr, with  ultimate goal of discontinuing tube feeds  Provides 474 ml formula, 710 kcal, 30 grams protein, 96 grams carbohydrate, 0 grams fiber, 362 ml free water from formula.   Minimum flushes 30 ml q4h to maintain tube patency, with additional flushes as required to maintain hydration  Call number provided to you by Jobe Gibbon if you require refills of enteral supplies sooner than is scheduled   Aim to increase oral intakes as tolerated. Review handouts on low fiber diet, tips for increasing calories, and tips for adding protein sent in MyChart and implement suggestions per individual tolerance. Generally would aim for small, frequent intakes throughout the day. Consider setting an alarm or timer if this helps remind you to eat at more routine intervals. High calorie/high protein liquids are likely to be an efficient way to add nutrition between meals.   Please keep a 3 day record of foods and beverages consumed (including portions) prior to next visit so that we can better quantify progress of oral intakes.     Follow up will occur on January 14, 2023.       Food/Nutrition-related history, Anthropometric measurements, Biochemical data, medical tests, procedures, Nutrition-focused physical findings, Patient understanding or compliance with intervention and recommendations , and Effectiveness of nutrition interventions will be assessed at time of follow-up.       Recommendations for Care Team :  Consider replenishing enteral orders as needed prior to discontinuation of EN. Per nurse representative from [facility], updated orders can be placed in Epic.      Patient referred to outpatient nutrition services as part of therapy/treatment plans.      Time spent 50 minutes     The patient reports they are physically located in [place] and is currently: at home. I conducted a audio/video visit. I spent  49m 18s on the video call with the patient. I spent an additional 150 minutes on pre- and post-visit activities on the date of service .         I am located on-site and the patient is located off-site for this visit.

## 2022-12-30 NOTE — Unmapped (Signed)
[facility] Refill Coordination Note    Specialty Medication(s) to be Shipped:   Hematology/Oncology: Ogsiveo 50mg     Other medication(s) to be shipped: No additional medications requested for fill at this time     [name] [name], DOB: 2000-06-25  Phone: [phone] (home) [phone] (work)      All above HIPAA information was verified with patient.     Was a Nurse, learning disability used for this call? No    Completed refill call assessment today to schedule patient's medication shipment from the [facility]  [phone]).  All relevant notes have been reviewed.     Specialty medication(s) and dose(s) confirmed: Regimen is correct and unchanged.   Changes to medications: [name] [name] reports starting the following medications: Lexapro  Changes to insurance: No  New side effects reported not previously addressed with a pharmacist or physician: Yes - Patient reports Dry mouth, taste buds are changing, severe nausea, hot flashes and migraines. Patient would not like to speak to the pharmacist today. Their provider is aware.  Questions for the pharmacist: No    Confirmed patient received a Conservation officer, historic buildings and a Surveyor, mining with first shipment. The patient will receive a drug information handout for each medication shipped and additional FDA Medication Guides as required.       DISEASE/MEDICATION-SPECIFIC INFORMATION        N/A    SPECIALTY MEDICATION ADHERENCE     Medication Adherence    Patient reported X missed doses in the last month: 2  Specialty Medication: Ogsiveo 50mg   Patient is on additional specialty medications: No  Informant: patient     Were doses missed due to medication being on hold? No    Ogsiveo 50 mg: 14 days of medicine on hand       REFERRAL TO PHARMACIST     Referral to the pharmacist: Not needed      [facility]     Shipping address confirmed in Epic.       Delivery Scheduled: Yes, Expected medication delivery date: 01/09/23. Medication will be delivered via UPS to the prescription address in Epic [place].    [name] [name] [name] [name]   [facility]  Specialty Technician

## 2022-12-31 DIAGNOSIS — R109 Unspecified abdominal pain: Secondary | ICD-10-CM | POA: Diagnosis not present

## 2022-12-31 DIAGNOSIS — R1084 Generalized abdominal pain: Secondary | ICD-10-CM | POA: Diagnosis not present

## 2022-12-31 DIAGNOSIS — K91850 Pouchitis: Secondary | ICD-10-CM | POA: Diagnosis not present

## 2022-12-31 DIAGNOSIS — Q8789 Other specified congenital malformation syndromes, not elsewhere classified: Secondary | ICD-10-CM | POA: Diagnosis not present

## 2023-01-01 DIAGNOSIS — Q8789 Other specified congenital malformation syndromes, not elsewhere classified: Secondary | ICD-10-CM | POA: Diagnosis not present

## 2023-01-01 DIAGNOSIS — K91850 Pouchitis: Secondary | ICD-10-CM | POA: Diagnosis not present

## 2023-01-01 DIAGNOSIS — R1084 Generalized abdominal pain: Secondary | ICD-10-CM | POA: Diagnosis not present

## 2023-01-01 DIAGNOSIS — R109 Unspecified abdominal pain: Secondary | ICD-10-CM | POA: Diagnosis not present

## 2023-01-01 MED ORDER — OXYCODONE 10 MG TABLET
ORAL_TABLET | Freq: Three times a day (TID) | ORAL | 0 refills | 30 days | Status: CP | PRN
Start: 2023-01-01 — End: ?

## 2023-01-01 NOTE — Unmapped (Signed)
Nutrition Plan:   Continue Osmolite 1.5 via ND as tolerated, 2 cartons per day at 50-75 ml/hr, with ultimate goal of discontinuing tube feeds  Provides 474 ml formula, 710 kcal, 30 grams protein, 96 grams carbohydrate, 0 grams fiber, 362 ml free water from formula.   Minimum flushes 30 ml q4h to maintain tube patency, with additional flushes as required to maintain hydration  Call number provided to you by Jobe Gibbon if you require refills of enteral supplies sooner than is scheduled   Aim to increase oral intakes as tolerated. Review handouts on low fiber diet, tips for increasing calories, and tips for adding protein sent in MyChart and implement suggestions per individual tolerance. Generally would aim for small, frequent intakes throughout the day. Consider setting an alarm or timer if this helps remind you to eat at more routine intervals. High calorie/high protein liquids are likely to be an efficient way to add nutrition between meals.   Please keep a 3 day record of foods and beverages consumed (including portions) prior to next visit so that we can better quantify progress of oral intakes.

## 2023-01-02 ENCOUNTER — Telehealth: Admit: 2023-01-02 | Discharge: 2023-01-03 | Payer: PRIVATE HEALTH INSURANCE

## 2023-01-02 DIAGNOSIS — K209 Esophagitis, unspecified without bleeding: Secondary | ICD-10-CM | POA: Diagnosis not present

## 2023-01-02 DIAGNOSIS — R1084 Generalized abdominal pain: Secondary | ICD-10-CM | POA: Diagnosis not present

## 2023-01-02 DIAGNOSIS — R109 Unspecified abdominal pain: Secondary | ICD-10-CM | POA: Diagnosis not present

## 2023-01-02 DIAGNOSIS — K91850 Pouchitis: Secondary | ICD-10-CM | POA: Diagnosis not present

## 2023-01-02 DIAGNOSIS — Q8789 Other specified congenital malformation syndromes, not elsewhere classified: Secondary | ICD-10-CM | POA: Diagnosis not present

## 2023-01-02 MED ORDER — SUCRALFATE 100 MG/ML ORAL SUSPENSION
Freq: Four times a day (QID) | ORAL | 11 refills | 30 days | Status: CP
Start: 2023-01-02 — End: 2024-01-02

## 2023-01-03 DIAGNOSIS — Q8789 Other specified congenital malformation syndromes, not elsewhere classified: Secondary | ICD-10-CM | POA: Diagnosis not present

## 2023-01-03 DIAGNOSIS — R1084 Generalized abdominal pain: Secondary | ICD-10-CM | POA: Diagnosis not present

## 2023-01-03 DIAGNOSIS — K91850 Pouchitis: Secondary | ICD-10-CM | POA: Diagnosis not present

## 2023-01-03 DIAGNOSIS — R109 Unspecified abdominal pain: Secondary | ICD-10-CM | POA: Diagnosis not present

## 2023-01-04 DIAGNOSIS — R109 Unspecified abdominal pain: Secondary | ICD-10-CM | POA: Diagnosis not present

## 2023-01-04 DIAGNOSIS — K91850 Pouchitis: Secondary | ICD-10-CM | POA: Diagnosis not present

## 2023-01-04 DIAGNOSIS — R1084 Generalized abdominal pain: Secondary | ICD-10-CM | POA: Diagnosis not present

## 2023-01-04 DIAGNOSIS — Q8789 Other specified congenital malformation syndromes, not elsewhere classified: Secondary | ICD-10-CM | POA: Diagnosis not present

## 2023-01-05 DIAGNOSIS — R1084 Generalized abdominal pain: Secondary | ICD-10-CM | POA: Diagnosis not present

## 2023-01-05 DIAGNOSIS — K91850 Pouchitis: Secondary | ICD-10-CM | POA: Diagnosis not present

## 2023-01-05 DIAGNOSIS — R109 Unspecified abdominal pain: Secondary | ICD-10-CM | POA: Diagnosis not present

## 2023-01-05 DIAGNOSIS — Q8789 Other specified congenital malformation syndromes, not elsewhere classified: Secondary | ICD-10-CM | POA: Diagnosis not present

## 2023-01-06 DIAGNOSIS — Q8789 Other specified congenital malformation syndromes, not elsewhere classified: Secondary | ICD-10-CM | POA: Diagnosis not present

## 2023-01-06 DIAGNOSIS — K91850 Pouchitis: Secondary | ICD-10-CM | POA: Diagnosis not present

## 2023-01-06 DIAGNOSIS — R109 Unspecified abdominal pain: Secondary | ICD-10-CM | POA: Diagnosis not present

## 2023-01-06 DIAGNOSIS — R1084 Generalized abdominal pain: Secondary | ICD-10-CM | POA: Diagnosis not present

## 2023-01-06 NOTE — Unmapped (Signed)
[facility]  Return Visit     Reason for visit: enteral nutrition support     Assessment and plan: This is a 22 year old woman with a history of [name] ([name]) s/p proctocolectomy w/ileoanal anastomosis, cutaneous desmoid tumors (previously on sorafenib), anemia, [name]. Admitted to [facility] on 09/25/2022 for ileus, inability to tolerate PO now on supplemental tube feed, and diffuse pain due to muscle spasms of uncertain etiology. She was discharged home 12/10/22 with a corpak in place and tube feeds. I reviewed [name] [name]'s excellent recommendations and answered a number of nutrition questions for [name] [name] and her mom. Trial of liquid Carafate for presumed esophagitis from nasoenteric tube.    All questions answered. Follow up January 30, 2023 at 4:30PM video.     The patient reports they are physically located in [place] and is currently: at home. I conducted a audio/video visit. I spent  48m 50s on the video call with the patient. I spent an additional 15 minutes on pre- and post-visit activities on the date of service. [name] Fanny MD    History of present illness: This is a 22 year old woman with a history of [name] ([name]) s/p proctocolectomy w/ileoanal anastomosis, cutaneous desmoid tumors (previously on sorafenib), anemia, [name]. Admitted to [facility] on 09/25/2022 for ileus, inability to tolerate PO now on supplemental tube feed, and diffuse pain due to muscle spasms of uncertain etiology. She was discharged home 12/10/22 with a corpak in place and with a plan for tube feeds. For the first week after discharge, she did not get tube feeds. She was discharged home with PMR but has not started PT/OT. The tube became dislodged soon after discharge and she had to have it replaced in an ER. She is now doing tube feeds and eating some. She had a very useful consult with our GI RD earlier this week. She was sent materials to help her figure out how to liberalize her diet. She appreciates the follow up. She has eaten some more since that visit (~500-600cal). She has had some episodes of esophageal pain/discomfort. This episodes are self limited.     Medications:  Reviewed in Epic

## 2023-01-07 DIAGNOSIS — Q8789 Other specified congenital malformation syndromes, not elsewhere classified: Secondary | ICD-10-CM | POA: Diagnosis not present

## 2023-01-07 DIAGNOSIS — R1084 Generalized abdominal pain: Secondary | ICD-10-CM | POA: Diagnosis not present

## 2023-01-07 DIAGNOSIS — K91850 Pouchitis: Secondary | ICD-10-CM | POA: Diagnosis not present

## 2023-01-07 DIAGNOSIS — R109 Unspecified abdominal pain: Secondary | ICD-10-CM | POA: Diagnosis not present

## 2023-01-08 DIAGNOSIS — K91850 Pouchitis: Secondary | ICD-10-CM | POA: Diagnosis not present

## 2023-01-08 DIAGNOSIS — R1084 Generalized abdominal pain: Secondary | ICD-10-CM | POA: Diagnosis not present

## 2023-01-08 DIAGNOSIS — R109 Unspecified abdominal pain: Secondary | ICD-10-CM | POA: Diagnosis not present

## 2023-01-08 DIAGNOSIS — Q8789 Other specified congenital malformation syndromes, not elsewhere classified: Secondary | ICD-10-CM | POA: Diagnosis not present

## 2023-01-08 MED FILL — OGSIVEO 50 MG TABLET: ORAL | 15 days supply | Qty: 90 | Fill #3

## 2023-01-09 DIAGNOSIS — K91850 Pouchitis: Secondary | ICD-10-CM | POA: Diagnosis not present

## 2023-01-09 DIAGNOSIS — Q8789 Other specified congenital malformation syndromes, not elsewhere classified: Secondary | ICD-10-CM | POA: Diagnosis not present

## 2023-01-09 DIAGNOSIS — R1084 Generalized abdominal pain: Secondary | ICD-10-CM | POA: Diagnosis not present

## 2023-01-09 DIAGNOSIS — R109 Unspecified abdominal pain: Secondary | ICD-10-CM | POA: Diagnosis not present

## 2023-01-09 MED ORDER — ESCITALOPRAM 5 MG TABLET
ORAL_TABLET | Freq: Every day | ORAL | 1 refills | 90 days | Status: CP
Start: 2023-01-09 — End: 2024-01-09

## 2023-01-09 NOTE — Unmapped (Signed)
Please refill if appropriate. Pt is requesting 90 day refills.     Most recent clinic visit: 12/16/2022  Next clinic visit: 01/23/2023

## 2023-01-10 DIAGNOSIS — K91850 Pouchitis: Secondary | ICD-10-CM | POA: Diagnosis not present

## 2023-01-11 DIAGNOSIS — K91850 Pouchitis: Secondary | ICD-10-CM | POA: Diagnosis not present

## 2023-01-12 DIAGNOSIS — K209 Esophagitis: Principal | ICD-10-CM

## 2023-01-12 DIAGNOSIS — K91850 Pouchitis: Secondary | ICD-10-CM | POA: Diagnosis not present

## 2023-01-12 NOTE — Unmapped (Unsigned)
[facility]  CONSULT NOTE - INFLAMMATORY BOWEL DISEASE  01/13/2023    Demographics:  [name] [name] is a 22 y.o. year old female    Referring physician:   Modena Nunnery, MD  [address]  [id number]  [place],  [place] [address]    Patient Care Team:  Jarold Motto, [facility] as PCP - General  Debbe Mounts, MD as Consulting Physician (Peds: Pediatric Hematology-Oncology)  Cherlynn Perches, MD as Consulting Physician (Orthopedic Surgery)  Jobe Gibbon, MD as Consulting Physician (Interventional Radiology)  Twana First, MD as Consulting Physician  Tatton-Howard, Clelia Croft, RN as Registered Nurse (Oncology Navigator)  Lenise Arena, Judye Bos, MD as Attending Provider (Surgical Oncology)  Clarene Duke, MD as Consulting Physician (Plastic Surgery)  Helyn Numbers, MD as Consulting Physician (Gastroenterology)  Childers, Biagio Quint, MD as Consulting Physician (Palliative Medicine)  Belva Bertin, RN as Registered Nurse (Palliative Medicine)  Lajuana Matte, RN as Registered Nurse (Oncology Navigator)  Sheral Apley, MD (Medical Oncology)  Lajuana Matte, RN as Registered Nurse (Oncology Navigator)  [name], MD as Fellow (Psychiatry)           HPI / NOTE :     Today, I saw [name] [name] for follow-up consultation in the [facility].  Outside records including available clinical notes, endoscopy reports, imaging results and pathology results were reviewed in detail as part of this follow-up consultation.       Chief complaint: Follow-up after hospitalization    HPI:    Overall improving, is able to eat, but only very specific food items. Still on enteral nutrition with [employer] (Osmolite 1.5 as tolerated, 2 cartons per day at 50-75 ml/hr) . Carafate helps with esophageal burning    Pain therapy for abdominal pain, chronic pain syndrome , diffuse myofascial pain : managed in pain clinic, off  diazepam , baclofen.  .   Oxycodone 10 mg three times daily, suboxone 0.75 tid    Anxiety/nausea : Phenergan 12.5 mg as needed , ondansetron as needed mirtazapine 7.5  mg.     Desmoid therapy: Off sorafenib, which may have caused bleeding in past    Nutrition: with nutrition in regular follow-up now    Pouch: More formed now 4-8, depending on food intake. Movantik daily causes to much diarrhea, reduction to as needed .       Review of Systems: positive for [name]  Otherwise, the balance of 10 systems is negative.    Bowel frequency /24 hours 8-12  Bowel frequency during night 0-1  Blood in bowel movements none  Abdominal pain  Moderate = 2  Bloating frequent  Incontinence none  Bodyweight increasing  Loperamide No  Lomotil No  Hyoscyamine:  No  Antibiotic none            Past Medical History:   Past medical history:   Past Medical History:   Diagnosis Date    Abdominal pain     Acid reflux     occas    Anesthesia complication     itching, shaking, coldness; last few surgeries have gone much better    Cancer (CMS-HCC)     Cataract of right eye     COVID-19 virus infection 01/2019    Cyst of thyroid determined by ultrasound     monitoring    [name]     2 right forearm, 1 left thigh, 1 right scapula, 1 under  left clavicle; multiple    Difficult intravenous access     [name] (familial adenomatous polyposis)     [name]     Gastric polyps     History of chemotherapy     last treatment approx 05/2019    History of colon polyps     History of COVID-19 01/2019    Iron deficiency anemia due to chronic blood loss     received iron infusion 11-2019    PONV (postoperative nausea and vomiting)     Rectal bleeding     Syncopal episodes     especially if becoming dehydrated         Family history:   Family History   Problem Relation Age of Onset    No Known Problems Mother     No Known Problems Father     No Known Problems Sister     No Known Problems Brother     Stroke Maternal Grandmother     Other Maternal Grandmother benign lesions of liver and pancreas, further details unknown    Cancer Maternal Grandmother     Diabetes Maternal Grandmother     Hypertension Maternal Grandmother     Thyroid disease Maternal Grandmother     Arthritis Maternal Grandfather     Asthma Maternal Grandfather     COPD Paternal Grandmother         Deceased    Miscarriages / Stillbirths Paternal Grandmother     Alcohol abuse Paternal Grandfather         Deceased    No Known Problems Maternal Aunt     No Known Problems Maternal Uncle     No Known Problems Paternal Aunt     No Known Problems Paternal Uncle     Anesthesia problems Neg Hx     Broken bones Neg Hx     Cancer Neg Hx     Clotting disorder Neg Hx     Collagen disease Neg Hx     Diabetes Neg Hx     Dislocations Neg Hx     Fibromyalgia Neg Hx     Gout Neg Hx     Hemophilia Neg Hx     Osteoporosis Neg Hx     Rheumatologic disease Neg Hx     Scoliosis Neg Hx     Severe sprains Neg Hx     Sickle cell anemia Neg Hx     Spinal Compression Fracture Neg Hx     Melanoma Neg Hx     Basal cell carcinoma Neg Hx     Squamous cell carcinoma Neg Hx      Social history:   Social History     Socioeconomic History    Marital status: Single   Tobacco Use    Smoking status: Never     Passive exposure: Past    Smokeless tobacco: Never   Vaping Use    Vaping status: Never Used   Substance and Sexual Activity    Alcohol use: Never    Drug use: Never    Sexual activity: Never   Other Topics Concern    Do you use sunscreen? Yes    Tanning bed use? No    Are you easily burned? No    Excessive sun exposure? No    Blistering sunburns? 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     Social Determinants  of Health     Financial Resource Strain: Low Risk  (09/26/2022)    Overall Financial Resource Strain (CARDIA)     Difficulty of Paying Living Expenses: Not very hard   Food Insecurity: No Food Insecurity (09/26/2022)    Hunger Vital Sign Worried About Running Out of Food in the Last Year: Never true     Ran Out of Food in the Last Year: Never true   Transportation Needs: No Transportation Needs (12/10/2022)    PRAPARE - Therapist, art (Medical): No     Lack of Transportation (Non-Medical): No             Allergies:     Allergies   Allergen Reactions    Adhesive Tape-Silicones Hives and Rash     Paper tape and Tegederm ok. Biopatch causes blistering but has tolerated CHG Tegaderm    Ferrlecit [Sodium Ferric Gluconat-Sucrose] Swelling and Rash    Levofloxacin Swelling and Rash     Swelling in mouth, rash,     Methylnaltrexone      Per Patient: I lost bowel control, severe abdominal cramping, and elevated BP    Neomycin Swelling     Rxn after ear drops; ear swelling    Papaya Hives    Morphine Nausea And Vomiting    Zosyn [Piperacillin-Tazobactam] Itching and Rash     Red and itchy    Compazine [Prochlorperazine] Other (See Comments)     Extreme agitation    Iron Analogues     Iron Dextran Itching     Received iron dextran 06/08/22 over 12 hours, had itching and redness/flushing during the infusion and for a couple days after. Required IV benadryl w/flares between doses and ultimately treated w/IV methylpred for 2 days.     Latex, Natural Rubber Rash             Medications:     Current Outpatient Medications   Medication Sig Dispense Refill    acetaminophen (TYLENOL) 500 MG tablet Take 2 tablets (1,000 mg total) by mouth every eight (8) hours.      buprenorphine-naloxone (SUBOXONE) 2-0.5 mg sublingual film Place 0.75 Film (1.5 mg of buprenorphine total) under the tongue three (3) times a day. Fill on or after: 12/26/22 68 Film 0    cholecalciferol, vitamin D3 25 mcg, 1,000 units,, 1,000 unit (25 mcg) tablet Take 1 tablet (25 mcg total) by mouth daily. 100 tablet 3    clobetasoL (TEMOVATE) 0.05 % ointment Apply topically two (2) times a day. To stubborn/thick rashes on hands/feet ears until clear/smooth. Restart as needed 60 g 3    COURIERED MED OR SUPPLY OGSVEO 50 MG TABLET. 1 each 0    escitalopram oxalate (LEXAPRO) 5 MG tablet TAKE 1 TABLET (5 MG TOTAL) BY MOUTH DAILY. 90 tablet 1    famotidine (PEPCID) 20 MG tablet Take 1 tablet (20 mg total) by mouth nightly. 30 tablet 0    hydrocortisone 2.5 % cream Apply topically two (2) times a day as needed (hemorrhoids). 30 g 0    lidocaine (ASPERCREME) 4 % patch Place 2 patches on the skin daily. 60 patch 0    LORazepam (ATIVAN) 0.5 MG tablet Take 2 tablets (1 mg total) by mouth Three (3) times a day as needed for anxiety.      mirtazapine (REMERON) 15 MG tablet Take 1 tablet (15 mg total) by mouth nightly. 90 tablet 3    naloxegol (MOVANTIK) 12.5 mg tablet Take 1  tablet (12.5 mg total) by mouth daily. 30 tablet 0    naloxone (NARCAN) 4 mg nasal spray One spray in either nostril once for known/suspected opioid overdose. May repeat every 2-3 minutes in alternating nostril til EMS arrives 2 each 0    nirogacestat (OGSIVEO) 50 mg tablet Take 3 tablets (150 mg total) by mouth two (2) times a day. Swallow tablets whole; do not break, crush, or chew. 180 tablet 5    ondansetron (ZOFRAN-ODT) 8 MG disintegrating tablet Dissolve 1 tablet (8 mg total) in the mouth every twelve (12) hours as needed for nausea. 60 tablet 0    oxyCODONE (ROXICODONE) 10 mg immediate release tablet Take 1 tablet (10 mg total) by mouth every eight (8) hours as needed for pain. Fill on or after: 01/01/23 90 tablet 0    pantoprazole (PROTONIX) 40 MG tablet Take 1 tablet (40 mg total) by mouth two (2) times a day. 60 tablet 0    pimecrolimus (ELIDEL) 1 % cream Apply topically two (2) times a day as needed. To face as needed for rash      sucralfate (CARAFATE) 100 mg/mL suspension Take 10 mL (1 g total) by mouth four (4) times a day. 1200 mL 11    triamcinolone (KENALOG) 0.1 % cream Apply topically two (2) times a day as needed. Apply to rash as needed      zinc oxide-cod liver oil (DESITIN 40%) 40 % Pste Apply topically daily as needed. 57 g 0     No current facility-administered medications for this visit.             Physical Exam:   There were no vitals taken for this visit.      BP Readings from Last 3 Encounters:   12/16/22 122/76   12/16/22 103/73   12/10/22 103/60      Wt Readings from Last 12 Encounters:   12/16/22 58.5 kg (129 lb)   12/16/22 58.5 kg (129 lb)   12/08/22 61.3 kg (135 lb 3.2 oz)   11/14/22 61 kg (134 lb 8 oz)   09/24/22 62.4 kg (137 lb 9.6 oz)   09/16/22 62 kg (136 lb 11.2 oz)   09/02/22 63 kg (139 lb)   08/05/22 62.1 kg (137 lb)   07/18/22 62.8 kg (138 lb 6.4 oz)   07/16/22 62.1 kg (137 lb)   07/14/22 62.9 kg (138 lb 9.6 oz)   06/15/22 63.6 kg (140 lb 3.4 oz)      BMI: Estimated body mass index is 21.47 kg/m?? as calculated from the following:    Height as of 01/01/23: 165.1 cm (5' 5).    Weight as of 12/16/22: 58.5 kg (129 lb).    BSA: Estimated body surface area is 1.64 meters squared as calculated from the following:    Height as of 01/01/23: 165.1 cm (5' 5).    Weight as of 12/16/22: 58.5 kg (129 lb).    Televisit          Labs, Data & Indices:     Lab Review:   No visits with results within 1 Week(s) from this visit.   Latest known visit with results is:   Office Visit on 12/23/2022   Component Date Value Ref Range Status    Current Medications 12/23/2022 SEE COMMENTS   Final    Creatinine-Adulterants 12/23/2022 560.8  mg/dL Final    Specific Gravity-Adulterants 12/23/2022 1.031   Final    Ph-Adultertants 12/23/2022 6.1   Final  Oxidants-Adulterants 12/23/2022 Negative  Cutoff: 200 mg/L Final    Comment, Urine 12/23/2022 Normal   Final    Urine Barbiturate Screen 12/23/2022 Negative  Cutoff: 200 ng/mL Final    Cocaine Metabolites, Ur 12/23/2022 Negative  Cutoff: 150 ng/mL Final    THC, Screen Urine 12/23/2022 Negative  Cutoff: 50 ng/mL Final    Codeine, Ur 12/23/2022 Not Detected  Cutoff: 25 ng/mL Final    Codeine-6-beta-glucuronide, Ur 12/23/2022 Not Detected  Cutoff: 100 ng/mL Final    Morphine, Ur 12/23/2022 Not Detected  Cutoff: 25 ng/mL Final    Morphine-6-beta-glucuronide, Ur 12/23/2022 Not Detected  Cutoff: 100 ng/mL Final    6-monoacetylmorphine, Ur 12/23/2022 Not Detected  Cutoff: 25 ng/mL Final    Hydrocodone, Ur 12/23/2022 Not Detected  Cutoff: 25 ng/mL Final    Norhydrocodone, Ur 12/23/2022 Not Detected  Cutoff: 25 ng/mL Final    Dihydrocodeine, Ur 12/23/2022 Not Detected  Cutoff: 25 ng/mL Final    Hydromorphone, Ur 12/23/2022 Not Detected  Cutoff: 25 ng/mL Final    Hydromorphone-3-beta-glucuronide, * 12/23/2022 Not Detected  Cutoff: 100 ng/mL Final    Oxycodone, Ur 12/23/2022 Present (A)  Cutoff: 25 ng/mL Final    Noroxycodone, Ur 12/23/2022 Present (A)  Cutoff: 25 ng/mL Final    Oxymorphone, Ur 12/23/2022 Present (A)  Cutoff: 25 ng/mL Final    Oxymorphone-3-beta-glucuronide, Ur 12/23/2022 Not Detected  Cutoff: 100 ng/mL Final    Noroxymorphone, Ur 12/23/2022 Present (A)  Cutoff: 25 ng/mL Final    Fentanyl, Ur 12/23/2022 Not Detected  Cutoff: 2 ng/mL Final    Norfentanyl, Ur 12/23/2022 Not Detected  Cutoff: 2 ng/mL Final    Meperidine, Ur 12/23/2022 Not Detected  Cutoff: 25 ng/mL Final    Normeperidine, Ur 12/23/2022 Not Detected  Cutoff: 25 ng/mL Final    Naloxone, Ur 12/23/2022 Not Detected  Cutoff: 25 ng/mL Final    Naloxone-3-beta-glucuronide, Ur 12/23/2022 Present (A)  Cutoff: 100 ng/mL Final    Methadone, Ur 12/23/2022 Not Detected  Cutoff: 25 ng/mL Final    EDDP, Ur 12/23/2022 Not Detected  Cutoff: 25 ng/mL Final    Propoxyphene, Ur 12/23/2022 Not Detected  Cutoff: 25 ng/mL Final    Norpropoxyphene, Ur 12/23/2022 Not Detected  Cutoff: 25 ng/mL Final    Tramadol, Ur 12/23/2022 Not Detected  Cutoff: 25 ng/mL Final    O-desmethyltramadol, Ur 12/23/2022 Not Detected  Cutoff: 25 ng/mL Final    Tapentadol, Ur 12/23/2022 Not Detected  Cutoff: 25 ng/mL Final    N-desmethyltapentadol, Ur 12/23/2022 Not Detected  Cutoff: 50 ng/mL Final    Tapentadol-beta-glucuronide, Ur 12/23/2022 Not Detected  Cutoff: 100 ng/mL Final    Buprenorphine, Ur 12/23/2022 Not Detected  Cutoff: 5 ng/mL Final    Norbuprenorphine, Ur 12/23/2022 Present (A)  Cutoff: 5 ng/mL Final    Norbuprenorphine glucuronide, Ur 12/23/2022 Present (A)  Cutoff: 20 ng/mL Final    Opioid Interpretation, Ur 12/23/2022 SEE COMMENTS   Final    Alprazolam, Ur 12/23/2022 Not Detected  Cutoff: 10 ng/mL Final    Alpha-Hydroxyalprazolam, Ur 12/23/2022 Not Detected  Cutoff: 10 ng/mL Final    Alpha-Hydroxyalprazolam Glucuronid* 12/23/2022 Not Detected  Cutoff: 50 ng/mL Final    Chlordiazepoxide, Ur 12/23/2022 Not Detected  Cutoff: 10 ng/mL Final    Clobazam, Ur 12/23/2022 Not Detected  Cutoff: 10 ng/mL Final    N-Desmethylclobazam, Ur 12/23/2022 Not Detected  Cutoff: 200 ng/mL Final    Clonazepam, Ur 12/23/2022 Not Detected  Cutoff: 10 ng/mL Final    7-aminoclonazepam, Ur 12/23/2022 Not Detected  Cutoff:  10 ng/mL Final    Diazepam, Ur 12/23/2022 Not Detected  Cutoff: 10 ng/mL Final    Nordiazepam, Ur 12/23/2022 Present (A)  Cutoff: 10 ng/mL Final    Flunitrazepam, Ur 12/23/2022 Not Detected  Cutoff: 10 ng/mL Final    7-aminoflunitrazepam, Ur 12/23/2022 Not Detected  Cutoff: 10 ng/mL Final    Flurazepam, Ur 12/23/2022 Not Detected  Cutoff: 10 ng/mL Final    2-Hydroxy Ethyl Flurazepam, Ur 12/23/2022 Not Detected  Cutoff: 10 ng/mL Final    Lorazepam, Ur 12/23/2022 Not Detected  Cutoff: 10 ng/mL Final    Lorazepam Glucuronide, Ur 12/23/2022 Not Detected  Cutoff: 50 ng/mL Final    Midazolam, Ur 12/23/2022 Not Detected  Cutoff: 10 ng/mL Final    Alpha-Hydroxy Midazolam, Ur 12/23/2022 Not Detected  Cutoff: 10 ng/mL Final    Oxazepam, Ur 12/23/2022 Present (A)  Cutoff: 10 ng/mL Final    Oxazepam Glucuronide, Ur 12/23/2022 Present (A)  Cutoff: 50 ng/mL Final    Prazepam, Ur 12/23/2022 Not Detected  Cutoff: 10 ng/mL Final    Temazepam, Ur 12/23/2022 Not Detected  Cutoff: 10 ng/mL Final    Temazepam Glucuronide, Ur 12/23/2022 Present (A)  Cutoff: 50 ng/mL Final    Triazolam, Ur 12/23/2022 Not Detected  Cutoff: 10 ng/mL Final    Alpha-Hydroxy Triazolam, Ur 12/23/2022 Not Detected  Cutoff: 10 ng/mL Final    Zolpidem, Ur 12/23/2022 Not Detected  Cutoff: 10 ng/mL Final    Zolpidem Phenyl-4-Carboxylic acid,* 12/23/2022 Not Detected  Cutoff: 10 ng/mL Final    Benzodiazepine Interpretation 12/23/2022 SEE COMMENTS   Final    Methamphetamine, Ur 12/23/2022 Not Detected  Cutoff: 100 ng/mL Final    Amphetamine, Ur 12/23/2022 Not Detected  Cutoff: 100 ng/mL Final    MDMA, Ur 12/23/2022 Not Detected  Cutoff: 100 ng/mL Final    MDEA, Ur 12/23/2022 Not Detected  Cutoff: 100 ng/mL Final    MDA, Ur 12/23/2022 Not Detected  Cutoff: 100 ng/mL Final    Ephedrine, Ur 12/23/2022 Not Detected  Cutoff: 100 ng/mL Final    Pseudoephedrine, Ur 12/23/2022 Not Detected  Cutoff: 100 ng/mL Final    Phentermine, Ur 12/23/2022 Not Detected  Cutoff: 100 ng/mL Final    Phencyclidine, Ur 12/23/2022 Not Detected  Cutoff: 20 ng/mL Final    Methylphenidate, Ur 12/23/2022 Not Detected  Cutoff: 20 ng/mL Final    Ritalinic Acid, Ur 12/23/2022 Not Detected  Cutoff: 100 ng/mL Final    Stimulant Interpretation 12/23/2022 SEE COMMENTS   Final     .............................................................................................................................................             HISTORY:     Brief Disease Course:    08/2020 colectomy for [name] 8/022 ileostomy takedown, complicated by postop ileus and inability to eat during stage 1 and 2 (need for TPN)   2023-2024 significant abdominal pain without a clear objective correlate, recurrent hospitalizations, need for TPN due to inability to keep nutrition up due to abdominal pain.   06/2022 hospitalization for abdominal pain, later on improvement and stop of TPN  07/2022 and 7-12/2022  hospitalization, later one for syncope, dystonia, muscle spasms, abdominal pain, GAD    Other Paraspinal and anterior chest desmoid tumors    Colectomy specimen:  Terminal ileum, appendix and colon, total colectomy:  -Tubular adenomas (at least 100), extending from the ileocecal valve to the rectum, with the majority located in the rectum. The adenomas range from 0.2 cm to 0.7 cm in greatest dimension.  -There is no evidence of high-grade dysplasia or malignancy.  -Mucosal  margins are negative for dysplasia (5.2 cm from proximal, 0.1 cm from distal).  -The appendix and ileum are not involved.      Endoscopy:   Pouchoscopy 07/2022     Some small ulcers at suture line, treated with APC due to suspicion that these are the culprit for recurrent bleeding. Otherwise normal.       Pouchoscopy 03/2022  The examined portion of the ileum was normal.                         - Friability with contact bleeding in the ileoanal                          pouch only along suture lines, otherwise healthy                          pouch. Treated with argon beam coagulation.                         - Rectal cuff with healthy appearing mucosa seen.                         - No specimens collected.        Pouchoscopy 02/2022     Patient is status-post total colectomy with an ileal pouch-anal        anastomosis.       Anal fissures were found on anoscopy       The ileocolonic anastomosis contained a single (solitary)        circumferential ulcer, approximately 10mm in length. Oozing was present.        Biopsies were taken with a cold forceps for histology. Coagulation for        bleeding prevention using argon plasma at 0.5 liters/minute and 25 watts        was successful.       The exam was otherwise without abnormality.       A: Colon, pouch anastomosis, biopsy:  -Severely active pouchitis with ulceration, mild glandular architecture distortion and focal adenomatous change.    Pouchoscopy 01/2022:  Anal fissure found on perianal exam.                         - Stool in the ileoanal pouch and neo-terminal ileum.                         - The ileoanal pouch and neo-terminal ileum are normal.                         - A single (solitary) ulcer at the ileocolonic                          anastomosis.    Upper endoscopy 08/2021     Normal esophagus.                         - Multiple gastric polyps. Biopsied.                         - Normal stomach otherwise. Biopsied.                         -  Normal examined duodenum. Biopsied.    A: Duodenum, biopsy  - Adenomatous polyp (1 fragment)  - No high grade dysplasia or carcinoma identified  - Separate fragments of duodenal mucosa with mi ld-moderate villous blunting  - No increased intraepithelial lymphocytes identified     B: Stomach, biopsy  - Gastric fundic mucosa with chronic superficial gastritis  - Gastric antral mucosa with erosive minimally active chronic superficial gastritis and slight reactive foveolar hyperplasia  - No Helicobacter pylori identified on H&E stain  - No intestinal metaplasia or dysplasia identified     C: Stomach, polypectomy  - Polypoid fragments of gastric fundic mucosa with features suggestive of early fundic gland polyps  - No intestinal metaplasia or dysplasia identified      Pouchoscopy 08/2021   The perianal and digital rectal examinations were normal.       The j-pouch, pouch inlet and rectal cuff appeared normal.       The exam was otherwise without abnormality, including no evidence of        mass of intussusception.                                                               Pouchoscopy 01/23/2021  Impression:            - Preparation of the colon was fair.                         - The examined portion of the ileum was normal.                         - The ileoanal pouch is normal.                         - Rectal cuff with congestion and an intact staple                          line seen.                         - No specimens collected.    - Hegar dilation 17 - 20 mm        Imaging:  Multiple imaging studies reviewed in EPIC          Assessment & Recommendations:     [name] [name] is a 22 y.o. female who presents for follow up for multiple GI and other issues after pouch done for [name] .   Overall the patient medical history and current as well as past problems are multifactorial ,  which often can not be  satisfactorily explained just based on the documented organic findings. Multiple teams are involved (pain management, oncology, in patient psychiatry, in patient nutrition, rehabilitation, neurology)    Pouchitis symptoms, abdominal distension: Overall stable, recommendation to try Banatrol (banana extract) to form the bowel movements . Intermittent bleeding in the past due to small erosions, will recheck iron status with next blood draw.     Diarrhea exacerbated with Movantik (which helped for abdominal pain in hospital , but now probably to potent probably due to lower narcotic use). Discussion that she  can use it as needed,       Abdominal pain: Multifactorial, exacerbated by anxiety, narcotic bowel syndrome and other factors.  On  buprenorphine      Anxiety/nausea : Lexapro stopped, on mirtazapine 7.5 mg , as needed Ativan.Discussed with Dr. [name] that follow-up with Adalberto Ill (psychologist) would be very helpful, since I think that many of the patient symptoms are worsened by GAD. Especially the dystonia is very difficult to explain.   .   Dystonia, inability to move: Need wheelchair during hospitalization. Reason for dystonia could not be evaluated. Neurology evaluated patient seemingly ruling out an acute dystonic reaction, cord compression, central process, nutritional deficiency, stiff person syndrome and [name] after normal EMG on 8/22. Was on valium and baclofen, both stopped, today no complaints.     Desmoid therapy:Off sorafenib.     Nutrition: Input of Christina Proch and Dr. Carmon Sails highly appreciated. Will see nutritionist tomorrow.     Fecal incontinence: Today no complaint    Iron deficiency anemia:will recheck.     Portions of this record have been created using Scientist, clinical (histocompatibility and immunogenetics). Dictation errors have been sought, but may not have been identified and corrected.        The patient reports they are physically located in [place] and is currently: at home. I conducted a audio/video visit. I spent  20 min  on the video call with the patient. I spent an additional 15 minutes on pre- and post-visit activities on the date of service .       PLAN:      Patient Instructions   Try Jerrel Ivory , ( google the name, Sim Boast has it) take 1-2 times daily 1-2 spoons to form up the stool    Continue mirtazapine 7.5 mg    Next visit on 12/3 .............................................................................................................................................             HISTORY:     Brief Disease Course:    08/2020 colectomy for [name] 8/022 ileostomy takedown, complicated by postop ileus and inability to eat during stage 1 and 2 (need for TPN)   2023-2024 significant abdominal pain without a clear objective correlate, recurrent hospitalizations, need for TPN due to inability to keep nutrition up due to abdominal pain.   06/2022 hospitalization for abdominal pain, later on improvement and stop of TPN  07/2022 and 7-12/2022  hospitalization, later one for syncope, dystonia, muscle spasms, abdominal pain, GAD    Other Paraspinal and anterior chest desmoid tumors    Colectomy specimen:  Terminal ileum, appendix and colon, total colectomy:  -Tubular adenomas (at least 100), extending from the ileocecal valve to the rectum, with the majority located in the rectum. The adenomas range from 0.2 cm to 0.7 cm in greatest dimension.  -There is no evidence of high-grade dysplasia or malignancy.  -Mucosal margins are negative for dysplasia (5.2 cm from proximal, 0.1 cm from distal).  -The appendix and ileum are not involved.      Endoscopy:   Pouchoscopy 07/2022     Some small ulcers at suture line, treated with APC due to suspicion that these are the culprit for recurrent bleeding. Otherwise normal.       Pouchoscopy 03/2022  The examined portion of the ileum was normal.                         - Friability with contact bleeding in the ileoanal  pouch only along suture lines, otherwise healthy                          pouch. Treated with argon beam coagulation.                         - Rectal cuff with healthy appearing mucosa seen.                         - No specimens collected.        Pouchoscopy 02/2022     Patient is status-post total colectomy with an ileal pouch-anal        anastomosis.       Anal fissures were found on anoscopy       The ileocolonic anastomosis contained a single (solitary)        circumferential ulcer, approximately 10mm in length. Oozing was present.        Biopsies were taken with a cold forceps for histology. Coagulation for        bleeding prevention using argon plasma at 0.5 liters/minute and 25 watts        was successful.       The exam was otherwise without abnormality.       A: Colon, pouch anastomosis, biopsy:  -Severely active pouchitis with ulceration, mild glandular architecture distortion and focal adenomatous change.    Pouchoscopy 01/2022:  Anal fissure found on perianal exam.                         - Stool in the ileoanal pouch and neo-terminal ileum.                         - The ileoanal pouch and neo-terminal ileum are normal.                         - A single (solitary) ulcer at the ileocolonic                          anastomosis.    Upper endoscopy 08/2021     Normal esophagus.                         - Multiple gastric polyps. Biopsied.                         - Normal stomach otherwise. Biopsied.                         - Normal examined duodenum. Biopsied.    A: Duodenum, biopsy  - Adenomatous polyp (1 fragment)  - No high grade dysplasia or carcinoma identified  - Separate fragments of duodenal mucosa with mild-moderate villous blunting  - No increased intraepithelial lymphocytes identified     B: Stomach, biopsy  - Gastric fundic mucosa with chronic superficial gastritis  - Gastric antral mucosa with erosive minimally active chronic superficial gastritis and slight reactive foveolar hyperplasia  - No Helicobacter pylori identified on H&E stain  - No intestinal metaplasia or dysplasia identified     C: Stomach, polypectomy  - Polypoid fragments of gastric fundic mucosa with features suggestive of early fundic gland polyps  -  No intestinal metaplasia or dysplasia identified      Pouchoscopy 08/2021   The perianal and digital rectal examinations were normal.       The j-pouch, pouch inlet and rectal cuff appeared normal.       The exam was otherwise without abnormality, including no evidence of        mass of intussusception.                                                               Pouchoscopy 01/23/2021  Impression:            - Preparation of the colon was fair.                         - The examined portion of the ileum was normal.                         - The ileoanal pouch is normal.                         - Rectal cuff with congestion and an intact staple                          line seen.                         - No specimens collected.    - Hegar dilation 17 - 20 mm        Imaging:  Multiple imaging studies reviewed in EPIC          Assessment & Recommendations:     [name] [name] is a 22 y.o. female who presents for follow up for multiple GI and other issues after pouch done for [name] .     Recent hospitalization for 2.5 months (after already several hospitalizations before). Overall the patient medical history and current presentation is based on a multifactorial  symptom complex, which I have problems to completely explain just based on the documented organic findings. Multiple teams are involved (pain management, oncology, in patient psychiatry, in patient nutrition, rehabilitation, neurology)    Pouchitis symptoms, abdominal distension: Overall stable, but currently more loose bowel movements due to liquid nutrition via Corpak. Patient is afraid of eating (and if she eats she tells me she eats a [employer]). Discussion with Dr. Carmon Sails (Nutrition) and Durenda Guthrie  to start nutritional counseling with aim to remove Corpak ASAP.       Abdominal pain: Multifactorial, exacerbated by anxiety, narcotic bowel syndrome and other factors.  On  buprenorphine . Patient said Movantik helps, which we will restart even in the current setting of more frequent bowel movements, since patient has already slow motility in general and worsening with her need for narcotic therapy. Anxiety/nausea : On Lexapro, mirtazapine increased to 7.5 mg , in the past on Atarax. Discussed with Dr. [name] that follow-up with Adalberto Ill (psychologist) would be very helpful, since I think that many of the patient symptoms are worsened by GAD. Especially the dystonia is very difficult to explain.   .   Dystonia, inability to move: Need wheelchair during hospitalization.  Reason for dystonia could not be evaluated. Neurology evaluated patient seemingly ruling out an acute dystonic reaction, cord compression, central process, nutritional deficiency, stiff person syndrome and [name] after normal EMG on 8/22. Continues to be well controlled with valium, small improvement with baclofen PRN.     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     Nutrition: Plan for nutrition counseling, currently on tube feeds and I am unable to figure out who provides and signs for tube feeds.     Fecal incontinence: Today no complaint    Iron deficiency anemia:Low normal ferritin levels, needs to be closely monitored.     Portions of this record have been created using Scientist, clinical (histocompatibility and immunogenetics). Dictation errors have been sought, but may not have been identified and corrected.     Decision-making today was high complexity on the basis of discussion of Gardner/desmoid tumors/pouchitis, chronic abdominal pain, and reviewing imaging/colonoscopy with future next steps.Discussion about further approach with DR. [name] and Dr. Carmon Sails. Plan now for close follow-up to prevent re-hospitalization.       The patient reports they are physically located in [place] and is currently: at home. I conducted a audio/video visit. I spent  30 min  on the video call with the patient. I spent an additional 60 minutes on pre- and post-visit activities on the date of service .       PLAN:      There are no Patient Instructions on file for this visit.

## 2023-01-13 ENCOUNTER — Telehealth
Admit: 2023-01-13 | Discharge: 2023-01-14 | Payer: BLUE CROSS/BLUE SHIELD | Attending: Gastroenterology | Primary: Gastroenterology

## 2023-01-13 DIAGNOSIS — R109 Unspecified abdominal pain: Principal | ICD-10-CM

## 2023-01-13 DIAGNOSIS — D485 Neoplasm of uncertain behavior of skin: Principal | ICD-10-CM

## 2023-01-13 DIAGNOSIS — Z789 Other specified health status: Principal | ICD-10-CM

## 2023-01-13 DIAGNOSIS — G8929 Other chronic pain: Principal | ICD-10-CM

## 2023-01-13 DIAGNOSIS — D509 Iron deficiency anemia, unspecified: Principal | ICD-10-CM

## 2023-01-13 DIAGNOSIS — K91850 Pouchitis: Principal | ICD-10-CM

## 2023-01-13 DIAGNOSIS — D1391 FAP (familial adenomatous polyposis): Principal | ICD-10-CM

## 2023-01-13 NOTE — Unmapped (Addendum)
Try Banatrol , ( google the name, Sim Boast has it) take 1-2 times daily 1-2 spoons to form up the stool    Continue mirtazapine 7.5 mg    Next visit on 12/3

## 2023-01-14 ENCOUNTER — Ambulatory Visit: Admit: 2023-01-14 | Discharge: 2023-01-14 | Payer: BLUE CROSS/BLUE SHIELD

## 2023-01-14 ENCOUNTER — Ambulatory Visit
Admit: 2023-01-14 | Discharge: 2023-01-14 | Payer: BLUE CROSS/BLUE SHIELD | Attending: Registered" | Primary: Registered"

## 2023-01-14 DIAGNOSIS — K91850 Pouchitis: Principal | ICD-10-CM

## 2023-01-14 DIAGNOSIS — D500 Iron deficiency anemia secondary to blood loss (chronic): Principal | ICD-10-CM

## 2023-01-14 DIAGNOSIS — Z789 Other specified health status: Principal | ICD-10-CM

## 2023-01-14 DIAGNOSIS — Z91040 Latex allergy status: Secondary | ICD-10-CM | POA: Diagnosis not present

## 2023-01-14 DIAGNOSIS — Z86718 Personal history of other venous thrombosis and embolism: Secondary | ICD-10-CM | POA: Diagnosis not present

## 2023-01-14 DIAGNOSIS — D509 Iron deficiency anemia, unspecified: Secondary | ICD-10-CM | POA: Diagnosis not present

## 2023-01-14 DIAGNOSIS — Z7901 Long term (current) use of anticoagulants: Secondary | ICD-10-CM | POA: Diagnosis not present

## 2023-01-14 DIAGNOSIS — Q8789 Other specified congenital malformation syndromes, not elsewhere classified: Secondary | ICD-10-CM | POA: Diagnosis not present

## 2023-01-14 DIAGNOSIS — Z881 Allergy status to other antibiotic agents status: Secondary | ICD-10-CM | POA: Diagnosis not present

## 2023-01-14 DIAGNOSIS — D48119 Desmoid tumor of unspecified site: Secondary | ICD-10-CM | POA: Diagnosis not present

## 2023-01-14 LAB — CBC W/ AUTO DIFF
BASOPHILS ABSOLUTE COUNT: 0 10*9/L (ref 0.0–0.1)
BASOPHILS RELATIVE PERCENT: 0.3 %
EOSINOPHILS ABSOLUTE COUNT: 0.4 10*9/L (ref 0.0–0.5)
EOSINOPHILS RELATIVE PERCENT: 3 %
HEMATOCRIT: 42.2 % (ref 34.0–44.0)
HEMOGLOBIN: 14 g/dL (ref 11.3–14.9)
LYMPHOCYTES ABSOLUTE COUNT: 2.6 10*9/L (ref 1.1–3.6)
LYMPHOCYTES RELATIVE PERCENT: 20.2 %
MEAN CORPUSCULAR HEMOGLOBIN CONC: 33.1 g/dL (ref 32.0–36.0)
MEAN CORPUSCULAR HEMOGLOBIN: 26.7 pg (ref 25.9–32.4)
MEAN CORPUSCULAR VOLUME: 80.8 fL (ref 77.6–95.7)
MEAN PLATELET VOLUME: 10.2 fL (ref 6.8–10.7)
MONOCYTES ABSOLUTE COUNT: 0.9 10*9/L — ABNORMAL HIGH (ref 0.3–0.8)
MONOCYTES RELATIVE PERCENT: 6.7 %
NEUTROPHILS ABSOLUTE COUNT: 9.2 10*9/L — ABNORMAL HIGH (ref 1.8–7.8)
NEUTROPHILS RELATIVE PERCENT: 69.8 %
PLATELET COUNT: 286 10*9/L (ref 150–450)
RED BLOOD CELL COUNT: 5.23 10*12/L — ABNORMAL HIGH (ref 3.95–5.13)
RED CELL DISTRIBUTION WIDTH: 13.7 % (ref 12.2–15.2)
WBC ADJUSTED: 13.1 10*9/L — ABNORMAL HIGH (ref 3.6–11.2)

## 2023-01-14 LAB — COMPREHENSIVE METABOLIC PANEL
ALBUMIN: 4.1 g/dL (ref 3.4–5.0)
ALKALINE PHOSPHATASE: 93 U/L (ref 46–116)
ALT (SGPT): 21 U/L (ref 10–49)
ANION GAP: 8 mmol/L (ref 5–14)
AST (SGOT): 16 U/L (ref <=34)
BILIRUBIN TOTAL: 0.2 mg/dL — ABNORMAL LOW (ref 0.3–1.2)
BLOOD UREA NITROGEN: 10 mg/dL (ref 9–23)
BUN / CREAT RATIO: 22
CALCIUM: 10.6 mg/dL — ABNORMAL HIGH (ref 8.7–10.4)
CHLORIDE: 107 mmol/L (ref 98–107)
CO2: 27.1 mmol/L (ref 20.0–31.0)
CREATININE: 0.46 mg/dL — ABNORMAL LOW
EGFR CKD-EPI (2021) FEMALE: >90 mL/min/{1.73_m2} (ref >=60)
GLUCOSE RANDOM: 86 mg/dL (ref 70–179)
POTASSIUM: 3.8 mmol/L (ref 3.4–4.8)
PROTEIN TOTAL: 8.3 g/dL — ABNORMAL HIGH (ref 5.7–8.2)
SODIUM: 142 mmol/L (ref 135–145)

## 2023-01-14 LAB — IRON PANEL
IRON SATURATION: 8 % — ABNORMAL LOW (ref 20–55)
IRON: 23 ug/dL — ABNORMAL LOW
TOTAL IRON BINDING CAPACITY: 278 ug/dL (ref 250–425)

## 2023-01-14 LAB — MAGNESIUM: MAGNESIUM: 2.5 mg/dL (ref 1.6–2.6)

## 2023-01-14 LAB — FERRITIN: FERRITIN: 30.2 ng/mL

## 2023-01-14 LAB — C-REACTIVE PROTEIN: C-REACTIVE PROTEIN: 16 mg/L — ABNORMAL HIGH (ref <=10.0)

## 2023-01-14 NOTE — Unmapped (Addendum)
[facility], you have been seen for follow-up for Anemia.    Lab Results   Component Value Date    WBC 9.8 12/16/2022    HGB 13.3 12/16/2022    HCT 39.7 12/16/2022    PLT 287 12/16/2022     We will repeat your labs today - already ordered by GI.     We will hopefully not need to give IV iron again any time soon, but would recommend this if you are anemic again and it can be given inpatient in the same way that it was before.     We will see you back in clinic in 6 months for a repeat lab check and follow-up visit.

## 2023-01-14 NOTE — Unmapped (Signed)
[facility] Outpatient Nutrition Services   Medical Nutrition Therapy Consultation       Visit Type:    Return Assessment    Referral Reason:    [name]         [name] [name] is a 22 y.o. female seen for medical nutrition therapy. Her active problem list, medication list, allergies, and lab results were reviewed.      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     Anthropometrics   Height: 165.1 cm (5' 5)   Weight: 58 kg (127 lb 12.8 oz) (last documented weight 01/14/23)   Body mass index is 21.27 kg/m??.    Wt Readings from Last 5 Encounters:   01/14/23 58 kg (127 lb 12.8 oz)   12/16/22 58.5 kg (129 lb)   12/16/22 58.5 kg (129 lb)   12/08/22 61.3 kg (135 lb 3.2 oz)   11/14/22 61 kg (134 lb 8 oz)      Usual body weight: patient states that she was around 140 lb when on TPN, but that she was told that it was probably fluid weight (states she had pitting edema); reports that she was about 133 lb when she left the hospital and that she is about 127 lb now (states that she does not observe any swelling now); states that she likes to stay around 120 lb range in general     Ideal Body Weight: 56.75 kg  Weight in (lb) to have BMI = 25: 149.9    Nutrition Risk Screening:   Food Insecurity: No Food Insecurity (09/26/2022)    Hunger Vital Sign     Worried About Running Out of Food in the Last Year: Never true     Ran Out of Food in the Last Year: Never true        Nutrition Focused Physical Exam:  Fat Areas Examined  Orbital: Mild loss      Muscle Areas Examined  Temple: Moderate loss  Patellar: Mild loss  Posterior Calf: Mild loss    Fluid Accumulation/Edema  Upper Extremities: No edema  Lower Extremities: No edema/fluid accumulation noted    Nutrition Evaluation  Overall Impressions: Mild muscle loss, Mild fat loss, No edema/fluid accumulation noted (01/19/23 1504)  Nutrition Designation: Normal weight (BMI 18.50 - 24.99 kg/m2) (01/19/23 1504)     Malnutrition Screening:   Patient does not meet AND/ASPEN criteria for malnutrition at this time (unable to assess all muscle/fat areas and unclear to what extent changes in volume status may be confounding weight change) (01/19/23 1505)    Biochemical Data, Medical Tests and Procedures:  All pertinent labs and imaging reviewed by Helen Hashimoto, RD/LDN at 3:13 PM 01/19/2023.    01/20/22- upper endoscopy  Impression:            - No evidence of blood or source of hematochezia seen                          on upper endoscopy- Normal esophagus.                         - Multiple gastric polyps. Polyps have previously been  biopsied and consistent with adenomas                         - Normal examined duodenum.                         - Normal examined jejunum.                         - No specimens collected.     08/05/22- pouch exam  Impression:            - The examined portion of the ileum was normal.                         - The ileoanal pouch is overall normal.                         - Rectal cuff with healthy appearing mucosa seen.                          Treated with argon beam coagulation.     11/11/22- CT pelvis w contrast  Impression:   Sequelae of proctocolectomy with ileoanal anastomosis with redemonstrated moderate dilatation of the proximal aspect of the ileoanal anastomosis, which appears less prominent compared to the prior study.       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       Redemonstrated ill-defined soft tissue mass within the lower central mesentery with associated tethering to adjacent small bowel loops, compatible with known desmoid tumor.       Distended urinary bladder, which is otherwise unremarkable in appearance. Correlate clinically for signs of urinary retention.      11/24/22- SLP documentation:   Recommendations: PO Diet, Consider alternative nutrition  Diet Liquids Recommendations: Thin Liquids, Level 0, No Restrictions  Diet Solids Recommendation: Regular Consistency Solids  Recommended Form of Medications: Whole, With puree (per pt preference)  Recommended Compensatory Techniques : Slow rate, Small sips/bites, Upright 90 degrees, HOB>30 degrees at all times, Monitor signs of aspiration, Alternate liquids and solids, Feed only when alert/awake, Endurance may be limited: give small meals and snacks, Upright 30 min after meal  Risk for Aspiration: Mild     12/12/22- DG Naso G Tube Plc W/FI W/Rad  IMPRESSION:   Successful repositioning of nasoenteric tube to the duodenum.   Okay for postpyloric use.      10/19/22- zinc 55 mcg/dl, copper WNL, vitamin E WNL, 25OH vitamin D WNL, folate WNL, vitamin B6 WNL, niacin WNL, vitamin B2 WNL, vitamin B1 298 nmol/L (high)  12/04/22- ferritin WNL, iron 42 micrograms/dl, TIBC 161.0 micrograms/dl, transferrin 960 mg/dl  4/54/09- phosphorous WNL  12/16/22- magnesium WNL, sodium WNL, potassium WNL, eGFR WNL, BG WNL    01/14/23- magnesium WNL, 25OH vitamin D, iron 23 micrograms/dl, iron saturation 8%, ferritin WNL, CRP 16 mg/L, sodium WNL, potassium WNL, creatinine 0.46 mg/dl, BUN WNL, eGFR WNL, BG WNL, calcium 10.6 mg/dl    Medications and Vitamin/Mineral Supplementation:   All nutritionally pertinent medications reviewed on 01/19/2023.     Nutritionally pertinent medications include: cholecalciferol, Ogsveo, Pepcid, Remeron, Movantik, ondansetron, oxycodone, pantoprazole, Carafate     She is not currently taking nutrition supplements.  Patient states that she is supposed to  be taking a multivitamin but didn't take it the first week she was home and forgot to start it after that. Notes that she is not currently taking vitamin D listed in chart. 01/14/23 update: patient reports that she is not taking vitamins, as multivitamin makes her feel sick when she tries to swallow it.      Current Outpatient Medications   Medication Sig Dispense Refill    acetaminophen (TYLENOL) 500 MG tablet Take 2 tablets (1,000 mg total) by mouth every eight (8) hours.      buprenorphine-naloxone (SUBOXONE) 2-0.5 mg sublingual film Place 0.75 Film (1.5 mg of buprenorphine total) under the tongue three (3) times a day. Fill on or after: 12/26/22 68 Film 0    cholecalciferol, vitamin D3 25 mcg, 1,000 units,, 1,000 unit (25 mcg) tablet Take 1 tablet (25 mcg total) by mouth daily. 100 tablet 3    clobetasoL (TEMOVATE) 0.05 % ointment Apply topically two (2) times a day. To stubborn/thick rashes on hands/feet ears until clear/smooth. Restart as needed 60 g 3    COURIERED MED OR SUPPLY OGSVEO 50 MG TABLET. 1 each 0    escitalopram oxalate (LEXAPRO) 5 MG tablet TAKE 1 TABLET (5 MG TOTAL) BY MOUTH DAILY. 90 tablet 1    famotidine (PEPCID) 20 MG tablet Take 1 tablet (20 mg total) by mouth nightly. 30 tablet 0    hydrocortisone 2.5 % cream Apply topically two (2) times a day as needed (hemorrhoids). 30 g 0    lidocaine (ASPERCREME) 4 % patch Place 2 patches on the skin daily. 60 patch 0    LORazepam (ATIVAN) 0.5 MG tablet Take 2 tablets (1 mg total) by mouth Three (3) times a day as needed for anxiety.      mirtazapine (REMERON) 15 MG tablet Take 1 tablet (15 mg total) by mouth nightly. 90 tablet 3    naloxegol (MOVANTIK) 12.5 mg tablet Take 1 tablet (12.5 mg total) by mouth daily. 30 tablet 0    naloxone (NARCAN) 4 mg nasal spray One spray in either nostril once for known/suspected opioid overdose. May repeat every 2-3 minutes in alternating nostril til EMS arrives 2 each 0    nirogacestat (OGSIVEO) 50 mg tablet Take 3 tablets (150 mg total) by mouth two (2) times a day. Swallow tablets whole; do not break, crush, or chew. 180 tablet 5    ondansetron (ZOFRAN-ODT) 8 MG disintegrating tablet Dissolve 1 tablet (8 mg total) in the mouth every twelve (12) hours as needed for nausea. 60 tablet 0    oxyCODONE (ROXICODONE) 10 mg immediate release tablet Take 1 tablet (10 mg total) by mouth every eight (8) hours as needed for pain. Fill on or after: 01/01/23 90 tablet 0    pantoprazole (PROTONIX) 40 MG tablet Take 1 tablet (40 mg total) by mouth two (2) times a day. 60 tablet 0    pimecrolimus (ELIDEL) 1 % cream Apply topically two (2) times a day as needed. To face as needed for rash      sucralfate (CARAFATE) 100 mg/mL suspension Take 10 mL (1 g total) by mouth four (4) times a day. 1200 mL 11    triamcinolone (KENALOG) 0.1 % cream Apply topically two (2) times a day as needed. Apply to rash as needed      zinc oxide-cod liver oil (DESITIN 40%) 40 % Pste Apply topically daily as needed. 57 g 0     No current facility-administered medications for this visit.  Nutrition History:     Dietary Restrictions: patient states that she avoided corn, popcorn, nuts and generally limits high fiber foods, as she was told to avoid these when she had temporary ileostomy and never heard different for J pouch. Patient notes that every time she eats chicken it makes her feel nauseated and sends her GI system into pain. States that she avoids bacon, because it makes her whole GI system itch. States that lettuce gives her GI bleeds, so avoids salads and will only eat lettuce in small amounts.       Gastrointestinal Issues: on 01/14/23, patient states that she plans to order [facility] on recommendation of Dr. [name]; states that on average, she has been having 7 BM every 24 hours (had been straight water at one point) and notes that she has had a lot of nausea without vomiting      Hunger and Satiety:  limited appetite after not eating much PO for a long time per patient (stomach shrunk) (no remarkable updates on 01/14/23)     Food Safety and Access: She did not report issues.     Diet Recall:   Time Intake- Tues 01/13/23 (+ infused 2 cartons of EN formula this day)    Breakfast Cup of milk    Snack (AM)    Lunch 1/2 croissant, scrambled egg, cheese, cup of milk    Snack (PM) Tried some cheese puffs    Dinner Salmon, rice, milk    Snack (HS) Another glass of milk      Food-Related History:  Additional 01/14/23 PO Intake Updates:   Patient and mom report that patient drinks about 3-4 eight oz glasses of 2% Lactaid milk per day. Finds water and tea harder to drink, but does water flushes. States that she is peeing more normally now. Had Pediasure PO about five times since last visit.   Hesitant about eating out following episodes of vomiting per patient. She states that this morning she had 1/2 cup of a [employer] parfait with strawberries, few oz of tea, milk. Patient states that she did not write down intakes for the three days prior to this visit, but she and mom verbally, retrospectively recall as below:     Sunday: (infused 3 cartons EN this day)   Breakfast- three cups of 2% Lactaid milk on this day  Lunch- skip   Dinner- salmon, few bites of steak, little bit of twice baked potato, grapes     Monday: (infused 3 cartons EN this day)  Breakfast- glass of milk   Lunch- half chicken salad croissant, 10 grapes, cup of milk   Snack- milk   Dinner- small piece salmon, 3/4 Minute Made sticky rice, cup of milk   Snack- 1/2 cup milk before bed     EN REGIMEN (as per 01/14/23 report)  Enteral Access: ND  Formula: Osmolite 1.5  Volume: 2-3 containers per day (474-711 ml total)   Rate: usually 60-75 ml/hr per patient   Time:patient states that her feeds typically infuse over 7-8 hours   Flushes: may do flushes around 80 ml now all day long (start, stop, and during feeds)  Modular: Prosource No Carb packets (has not used since being home per report, but still has some from admission)   Other: mom states that they got more supplies from Tracy from [facility]     Provides (excluding modular): 474-711 ml formula, [phone] kcal, 30-45 grams protein, 96-145 grams carbohydrate, 0 grams fiber, 362-543 ml free water  from formula.      Per product website, 1000 mL formula provides at least 100% of the RDIs for 25 essential vitamins and minerals (patient taking in 47-71% of this volume with two to three cartons of Osmolite 1.5 per day).    Eating Behaviors:  Not addressed this visit.     Physical Activity:  Not addressed this visit.     Daily Estimated Nutritional Needs:  Energy: ~0981-1914 kcals [25-30 kcal/kg using last recorded weight, 58.5 kg (01/01/23 1530)]  Protein: ~70-88 gm [1.2-1.5 gm/kg using last recorded weight, 58.5 kg (01/01/23 1530)]  Carbohydrate:   [no restriction]  Fluid:   [per MD team]    Nutrition Goals & Evaluation      Patient will meet estimated nutritional needs via PO intakes, with goal of discontinuing EN per discussions with GI team  (Progressing)  Patient will prevent unintentional loss of dry weight  (Progressing)  Patient will implement low fiber nutrition therapy principles as tolerated  (Progressing)  Patient will implement high calorie/high protein PO nutrition strategies as tolerated  (Ongoing)  Patient will keep record of PO intake to better quantify progress  (Ongoing)     Nutrition goals reviewed, relevant barriers identified and addressed: none evident. She is evaluated to have good to fair willingness and ability to achieve nutrition goals.       Nutrition Assessment       Patient, accompanied by her mom, seen in person at [facility] on 01/14/23. She notes diarrhea in the setting of Movantik (likely exacerbating diarrhea now per Dr. Hassan Rowan note), with Dr. [name] recommending trial of Banatrol (pt has not started this yet). PO intake appears to be improving per verbal retrospective recall of PO intake. Patient reports that she has not documented PO intakes to this point. RD emphasized that this can be helpful going forward, to help provide a more accurate estimate of how PO intake is progressing as team aims to wean off EN. Patient also notes that she has not been taking vitamin that was ordered for her due to nausea when swallowing it; discussed consideration of a chewable vitamin (e.g. Flintstones) to fill in the gaps, as total EN volume does not meet threshold for meeting DRI's. Addressed questions and encouraged patient to reach out as needed for additional support between visits.     Nutrition Intervention      - Nutrition Counseling: Goal setting Problem solving  - Care Coordination: Collaboration with other provider(s): Dr. Carmon Sails and Dr. Phineas Semen on close of encounter    Nutrition Plan:   Continue Osmolite 1.5 via ND as tolerated, 2 cartons per day at 50-75 ml/hr, with ultimate goal of discontinuing tube feeds  Provides 474 ml formula, 710 kcal, 30 grams protein, 96 grams carbohydrate, 0 grams fiber, 362 ml free water from formula.   Minimum flushes 30 ml q4h to maintain tube patency, with additional flushes as required to maintain hydration  Call number provided to you by Jobe Gibbon if you require refills of enteral supplies sooner than is scheduled   Aim to increase oral intakes as tolerated. Review handouts on low fiber diet, tips for increasing calories, and tips for adding protein sent in MyChart first visit and implement suggestions per individual tolerance. Generally would aim for small, frequent intakes throughout the day. Consider setting an alarm or timer if this helps remind you to eat at more routine intervals. High calorie/high protein liquids are likely to be an efficient way to add nutrition between meals.  Please keep a 3 day record of foods and beverages consumed (including portions) prior to next visit so that we can better quantify progress of oral intakes.See example of food/symptom diary sent via MyChart this visit for ideas of what notes may be helpful to include (although you can change the format if you'd like, so long as it contains details and portions of foods consumed- for example, instead of milk, would put 4 oz 2% Lactaid milk). You do not have to worry about calculating the calories, as this can be done by RD.   If the multivitamin you have is not well tolerated, consider chewable vitamin (e.g. Flintstones) as tolerated instead     Follow up will occur in February 09, 2023 via video.       Food/Nutrition-related history, Anthropometric measurements, Biochemical data, medical tests, procedures, Nutrition-focused physical findings, Patient understanding or compliance with intervention and recommendations , and Effectiveness of nutrition interventions will be assessed at time of follow-up.       Recommendations for Care Team :  Consider replenishing enteral orders as needed prior to discontinuation of EN. Per nurse representative from [facility], updated orders can be placed in Epic.      Patient referred to outpatient nutrition services as part of therapy/treatment plans.      Time spent 62 minutes   I am located on-site and the patient is located on-site for this visit.

## 2023-01-14 NOTE — Unmapped (Signed)
Hematology Consult Note    Referring Physician :  Jarold Motto, [facility]    Primary Care Physician:   Jarold Motto, [facility]    Other Consulting Physicians:  Dr. [name]    Reason for Consult:  New VTE    Assessment/Recommendations:   [name] [name] is a 22 y.o. caucasian female with history of [name], syncope, desmoid tumors, s/p colectomy, with severe malnutrition on TPN,  who we are seeing in consultation at the request of Jarold Motto for further evaluation and management of DVT and superficial vein thrombosis.    1.  Iron Deficiency Anemia: History of iron deficiency anemia likely due to uterine bleeding, possible impaired absorption due to abdominal disease, and GI bleeding.  She received IV iron in Fall 2022, ferritin was 8.1.  Recurrent IDA in February 2023.  Discussed options and pursued ferrlecit 750mg  over 3 doses.  She tolerated initial doses well but the third dose caused severe side effects including extremity swelling, difficulty breathing, and urticaria, for which she proceeded to the ED and was given steroids and benadryl.  We discussed last visit that re-challenging her with Ferrlecit would have risks, but that we could pursue this with [facility] protocol.  A dose of Ferrlecit with [facility] protocol was attempted on 04/08/22 and she unfortunately developed itching and redness. Venofer was then attempted and she developed a reaction with the second dose. She was admitted for other reasons in March 2024 and was able to receive 1g Infed though she required steroids, benadryl, and pepcid afterward, and symptoms persisted for days. She last received FeraHeme in August 2024 during a hospitalization and again had symptoms of rash/itching but was able to complete infusion. Labs today are excellent with hemoglobin up to 14.0 and MCV 80.8. Ferritin is borderline, at 30.2. We reviewed that given her history and risk of infusions in her situation, we would not recommend IV iron for iron deficiency without anemia. If anemia recurs and is severe, we would consider repeat IV iron, but would likely require admission. Another option is to pursue this if she is hospitalized for other reasons in the future, even if not anemic, to prevent need to admit for this alone. We will continue follow-up and repeat labs q5mo.     2.  DVT, Superficial Vein Thrombosis: History of 1 line-associated DVT and subsequent residual superficial vein thrombus.   See HPI below for detailed history.  Imaging on POCUS on 03/13/21 by Dr. Selena Batten showed RUE brachial vein thrombus associated with PICC line, per note.  The PICC removed same day and due to persistent symptoms, she then had RUE PVL on 03/19/21 which showed continued obstruction in the basilic vein (residual thrombus at isolated segment).  VTE risk factors include: 1) Prolonged Hospital Stay, 2) PICC line.  Started on therapy with Eliquis 5mg  BID which was tolerated except for uterine bleeding.  We previously decided to continue anticoagulation for 6 weeks and until her catheter was removed.  She has remained off anticoagulation.  She does have a newer tunneled CVAD in the last 33mo and reports she has not been able to be anticoagulated, even prophylactically, due to bleeding. As she is having this removed soon and has done fine without recurrent VTE, we will not plan to start prophylaxis at this time.     Plans:  -  Labs today: CBC, Iron and TIBC, Ferritin, CRP  -  No plan for additional IV iron at this time. Consider IV iron in the future for severe  anemia, likely in the hospital setting given history.  -  Follow-up in 49mo.     History of Present Illness:   [name] [name] is a 22 y.o. caucasian female with history of [name], syncope, desmoid tumors, s/p colectomy, with severe malnutrition on TPN, who we are seeing in consultation at the request of Dr. Bufford Buttner for further evaluation of DVT and superficial vein thrombosis.    [name] [name] had a PICC line placed on 02/22/21 because her IV access was so difficult.  She was hospitalized from 02/15/21 to 03/09/21 for bowel obstruction.  She states she had a lot of pain, shooting down to her pinky, and bleeding from the PICC site.  She saw Dr. Selena Batten in IR on 03/13/20 and he noted a brachial vein thrombus on POCUS.  He pulled the PICC line, recommended a tunneled line which was placed on 03/14/21.  She saw Dr. [name] on 03/19/21 and felt the RUE swelling was worse and she feels like there is extra fluid around her neck and chest.  She had a PVL which showed basilic vein thrombus. She was started on Eliquis 5mg  BID.  She feels this has gotten better in the past several days. She has been having uterine bleeding since starting the Eliquis, it is now over a week.  This is variable.     She had some shortness of breath on 03/27/21 and had CTA which was negative.  She notes she is also fluid up possibly from TPA.  She further denies palpitations, but does have lightheadedness and syncope.  She denies abnormal bruising or unusual bleeding including heavy menses, mucosal bleeding or GI bleeding (melena, hematochezia).  Notes that she would not notice possible GI bleeding.  She does not currently take regular NSAIDS or aspirin.  She reports lower extremities are equal in size.    [name] [name] received ferrlecit and reports a reaction with difficulty breathing and received benadryl and steroid at OSH ED in March 2023. She states when she got up from the chair she noticed numbness in her feet, then nerve tingling everywhere.  Then when she got home it didn't feel better and her home nurse came to change her central line dressing and her hands were very swollen and wouldn't bend and she noticed hives forming in the back of her legs and feet, feet were also swollen.  When she felt neck swelling and shortness of breath she went to the ED. Even after the injected steroid she continued swelling.  She was discharged on prednisone and benadryl.  It continued to hurt to walk.  This resolved over time.  She has also had her CVAD removed and is off Eliquis now.  She had a line infiltrate on the left about a week or two ago and has some bruising.  She had a two week period a few weeks ago and has been spotting since then. She is eating more and is off TPN.     Interval History:  [name] [name] was last seen in May 2024. She has since spent a lot of time in the hospital. She has had IV iron a few times but was finally able to get a whole 1020mg  on 10/17/22 while in the hospital. She developed a rash and itching despite premeds but was able to complete infusion. She did 2 days of zyrtec prior to infusion as well prednisone at 13/7, hydrocort 40mg  30 min prior, and IV benadryl and pepcid 30 min prior. The infusion was slowed.  Energy level has improved since then. She notes easy bruising. She had a 2-week period about 13mo ago. She is also on a new chemotherapy and notes hot flashes started with this.     Previous Clots: 1    Clot 1: RUE DVT, Superficial Vein Thrombosis, RUE, continued obstruction in the basilic vein (residual thrombus at isolated segment), RUE Brachial vein thrombus, noted only on POCUS 03/13/21, in setting of PICC line,  03/19/21  presented with swelling, erythema and extremity pain  Risk Factors:  1) Prolonged Hospital Stay, 2) PICC line  Anticoagulant: Yes: Eliquis.  Well Tolerated?: Yes.    Relevant General Clotting History:  Previous Pregnancies: No/ none.,   Contraceptive Use: none, brief depo shot, clotting during prior OCP use? No/ none.  History of Trauma/Surgeries: Yes: several, no VTE.  Family History of Clots: no   Parents?: yes, without VTEs   Children?: 0  Siblings?:   Sisters: 1, without VTE  Brothers: 1 half, without VTE  Aunts/Uncles?: 3, no known VTE  Smoking History?: non-smoker  Known Thrombophilia: No/ none.  BMI: Body mass index is 21.27 kg/m??.      Visit Diagnoses:  No diagnosis found.  Past Medical History:   Diagnosis Date    Abdominal pain     Acid reflux     occas    Anesthesia complication     itching, shaking, coldness; last few surgeries have gone much better    Cancer (CMS-HCC)     Cataract of right eye     COVID-19 virus infection 01/2019    Cyst of thyroid determined by ultrasound     monitoring    [name]     2 right forearm, 1 left thigh, 1 right scapula, 1 under left clavicle; multiple    Difficult intravenous access     [name] (familial adenomatous polyposis)     [name]     Gastric polyps     History of chemotherapy     last treatment approx 05/2019    History of colon polyps     History of COVID-19 01/2019    Iron deficiency anemia due to chronic blood loss     received iron infusion 11-2019    PONV (postoperative nausea and vomiting)     Rectal bleeding     Syncopal episodes     especially if becoming dehydrated       Medical History:   Past Medical History:   Diagnosis Date    Abdominal pain     Acid reflux     occas    Anesthesia complication     itching, shaking, coldness; last few surgeries have gone much better    Cancer (CMS-HCC)     Cataract of right eye     COVID-19 virus infection 01/2019    Cyst of thyroid determined by ultrasound     monitoring    [name]     2 right forearm, 1 left thigh, 1 right scapula, 1 under left clavicle; multiple    Difficult intravenous access     [name] (familial adenomatous polyposis)     [name]     Gastric polyps     History of chemotherapy     last treatment approx 05/2019    History of colon polyps     History of COVID-19 01/2019    Iron deficiency anemia due to chronic blood loss     received iron infusion 11-2019    PONV (postoperative nausea and vomiting)  Rectal bleeding     Syncopal episodes     especially if becoming dehydrated        Surgical History:  Past Surgical History:   Procedure Laterality Date    COLON SURGERY      cyroablation      cystis removal      desmoid removal      PR CLOSE ENTEROSTOMY,RESEC+ANAST N/A 10/09/2020    Procedure: ILEOSTOMY TAKEDOWN;  Surgeon: Mickle Asper, MD;  Location: OR [place];  Service: General Surgery    PR COLONOSCOPY W/BIOPSY SINGLE/MULTIPLE N/A 10/27/2012    Procedure: COLONOSCOPY, FLEXIBLE, PROXIMAL TO SPLENIC FLEXURE; WITH BIOPSY, SINGLE OR MULTIPLE;  Surgeon: Shirlyn Goltz Mir, MD;  Location: PEDS PROCEDURE ROOM [facility];  Service: Gastroenterology    PR COLONOSCOPY W/BIOPSY SINGLE/MULTIPLE N/A 09/14/2013    Procedure: COLONOSCOPY, FLEXIBLE, PROXIMAL TO SPLENIC FLEXURE; WITH BIOPSY, SINGLE OR MULTIPLE;  Surgeon: Shirlyn Goltz Mir, MD;  Location: PEDS PROCEDURE ROOM [place];  Service: Gastroenterology    PR COLONOSCOPY W/BIOPSY SINGLE/MULTIPLE N/A 11/08/2014    Procedure: COLONOSCOPY, FLEXIBLE, PROXIMAL TO SPLENIC FLEXURE; WITH BIOPSY, SINGLE OR MULTIPLE;  Surgeon: Arnold Long Mir, MD;  Location: PEDS PROCEDURE ROOM [facility];  Service: Gastroenterology    PR COLONOSCOPY W/BIOPSY SINGLE/MULTIPLE N/A 12/26/2015    Procedure: COLONOSCOPY, FLEXIBLE, PROXIMAL TO SPLENIC FLEXURE; WITH BIOPSY, SINGLE OR MULTIPLE;  Surgeon: Arnold Long Mir, MD;  Location: PEDS PROCEDURE ROOM [facility];  Service: Gastroenterology    PR COLONOSCOPY W/BIOPSY SINGLE/MULTIPLE N/A 09/02/2017    Procedure: COLONOSCOPY, FLEXIBLE, PROXIMAL TO SPLENIC FLEXURE; WITH BIOPSY, SINGLE OR MULTIPLE;  Surgeon: Arnold Long Mir, MD;  Location: PEDS PROCEDURE ROOM [place];  Service: Gastroenterology    PR COLSC FLX W/REMOVAL LESION BY HOT BX FORCEPS N/A 08/27/2016    Procedure: COLONOSCOPY, FLEXIBLE, PROXIMAL TO SPLENIC FLEXURE; W/REMOVAL TUMOR/POLYP/OTHER LESION, HOT BX FORCEP/CAUTE;  Surgeon: Arnold Long Mir, MD;  Location: PEDS PROCEDURE ROOM [facility];  Service: Gastroenterology    PR COLSC FLX W/RMVL OF TUMOR POLYP LESION SNARE TQ N/A 02/25/2019    Procedure: COLONOSCOPY FLEX; W/REMOV TUMOR/LES BY SNARE;  Surgeon: Helyn Numbers, MD;  [facility] [place] [facility];  Service: Gastroenterology    PR COLSC FLX W/RMVL OF TUMOR POLYP LESION SNARE TQ N/A 03/13/2020    Procedure: COLONOSCOPY FLEX; W/REMOV TUMOR/LES BY SNARE;  Surgeon: Helyn Numbers, MD;  [facility] [facility];  Service: Gastroenterology    PR EXC SKIN BENIG 2.1-3 CM TRUNK,ARM,LEG Right 02/25/2017    Procedure: EXCISION, BENIGN LESION INCLUDE MARGINS, EXCEPT SKIN TAG, LEGS; EXCISED DIAMETER 2.1 TO 3.0 CM;  Surgeon: Clarene Duke, MD;  Location: CHILDRENS OR [facility];  Service: Plastics    PR EXC SKIN BENIG 3.1-4 CM TRUNK,ARM,LEG Right 02/25/2017    Procedure: EXCISION, BENIGN LESION INCLUDE MARGINS, EXCEPT SKIN TAG, ARMS; EXCISED DIAMETER 3.1 TO 4.0 CM;  Surgeon: Clarene Duke, MD;  Location: CHILDRENS OR [facility];  Service: Plastics    PR EXC SKIN BENIG >4 CM FACE,FACIAL Right 02/25/2017    Procedure: EXCISION, OTHER BENIGN LES INCLUD MARGIN, FACE/EARS/EYELIDS/NOSE/LIPS/MUCOUS MEMBRANE; EXCISED DIAM >4.0 CM;  Surgeon: Clarene Duke, MD;  Location: CHILDRENS OR [facility];  Service: Plastics    PR EXC TUMOR SOFT TISSUE LEG/ANKLE SUBQ 3+CM Right 08/05/2019    Procedure: EXCISION, TUMOR, SOFT TISSUE OF LEG OR ANKLE AREA, SUBCUTANEOUS; 3 CM OR GREATER;  Surgeon: Arsenio Katz, MD;  Location: MAIN OR [facility];  Service: Plastics    PR EXC TUMOR SOFT TISSUE LEG/ANKLE SUBQ <3CM Right 08/05/2019  Procedure: EXCISION, TUMOR, SOFT TISSUE OF LEG OR ANKLE AREA, SUBCUTANEOUS; LESS THAN 3 CM;  Surgeon: Arsenio Katz, MD;  Location: MAIN OR [facility];  Service: Plastics    PR LAP, SURG PROCTECTOMY W J-POUCH N/A 08/10/2020    Procedure: ROBOTIC ASSISTED LAPAROSCOPIC PROCTOCOLECTOMY, ILEAL J POUCH, WITH OSTOMY;  Surgeon: Mickle Asper, MD;  Location: OR [place];  Service: General Surgery    PR NDSC EVAL INTSTINAL POUCH DX W/COLLJ SPEC SPX N/A 01/23/2021    Procedure: ENDO EVAL SM INTEST POUCH; DX;  Surgeon: Modena Nunnery, MD;  [facility]: [facility] [facility];  Service: Gastroenterology    PR NDSC EVAL INTSTINAL POUCH DX W/COLLJ SPEC SPX N/A 08/27/2021    Procedure: ENDO EVAL SM INTEST POUCH; DX; Surgeon: Hunt Oris, MD;  Location: [facility]URES [facility];  Service: Gastroenterology    PR NDSC EVAL INTSTINAL POUCH DX W/COLLJ SPEC SPX N/A 12/09/2021    Procedure: ENDO EVAL SM INTEST POUCH; DX;  Surgeon: Vidal Schwalbe, MD;  [facility]: [facility]URES [facility];  Service: Gastroenterology    PR NDSC EVAL INTSTINAL POUCH DX W/COLLJ [facility] SPX Left 04/09/2022    Procedure: ENDO EVAL SM INTEST POUCH; DX;  Surgeon: Modena Nunnery, MD;  [facility] [facility];  Service: Gastroenterology    PR NDSC EVAL INTSTINAL POUCH DX W/COLLJ SPEC SPX N/A 08/05/2022    Procedure: ENDO EVAL SM INTEST POUCH; DX;  Surgeon: Modena Nunnery, MD;  Location: GI PROCEDURES [facility];  Service: Gastroenterology    PR NDSC EVAL INTSTINAL POUCH W/BX SINGLE/MULTIPLE N/A 01/20/2022    Procedure: ENDOSCOPIC EVAL OF SMALL INTESTINAL POUCH; DIAGNOSTIC, No biopsies;  Surgeon: Andrey Farmer, MD;  Location: GI PROCEDURES [facility];  Service: Gastroenterology    PR NDSC EVAL INTSTINAL POUCH W/BX SINGLE/MULTIPLE N/A 02/13/2022    Procedure: ENDOSCOPIC EVAL OF SMALL INTESTINAL POUCH; DIAGNOSTIC, WITH BIOPSY;  Surgeon: Bronson Curb, MD;  Location: GI PROCEDURES [facility];  Service: Gastroenterology    PR UNLISTED PROCEDURE SMALL INTESTINE  01/23/2021    Procedure: UNLISTED PROCEDURE, SMALL INTESTINE;  Surgeon: Modena Nunnery, MD;  [facility] [place] [facility];  Service: Gastroenterology    PR UNLISTED PROCEDURE SMALL INTESTINE  02/13/2022    Procedure: UNLISTED PROCEDURE, SMALL INTESTINE;  Surgeon: Bronson Curb, MD;  Location: [facility] PROC[facility]URES [facility];  Service: Gastroenterology    PR UPPER GI ENDOSCOPY,BIOPSY N/A 10/27/2012    Procedure: UGI ENDOSCOPY; WITH BIOPSY, SINGLE OR MULTIPLE;  Surgeon: Shirlyn Goltz Mir, MD;  Location: PEDS PROCEDURE ROOM [facility];  Service: Gastroenterology    PR UPPER GI ENDOSCOPY,BIOPSY N/A 09/14/2013    Procedure: UGI ENDOSCOPY; WITH BIOPSY, SINGLE OR MULTIPLE;  Surgeon: Shirlyn Goltz Mir, MD;  Location: PEDS PROCEDURE ROOM [facility];  Service: Gastroenterology    PR UPPER GI ENDOSCOPY,BIOPSY N/A 11/08/2014    Procedure: UGI ENDOSCOPY; WITH BIOPSY, SINGLE OR MULTIPLE;  Surgeon: Arnold Long Mir, MD;  Location: PEDS PROCEDURE ROOM [facility];  Service: Gastroenterology    PR UPPER GI ENDOSCOPY,BIOPSY N/A 12/26/2015    Procedure: UGI ENDOSCOPY; WITH BIOPSY, SINGLE OR MULTIPLE;  Surgeon: Arnold Long Mir, MD;  Location: PEDS PROCEDURE ROOM [facility];  Service: Gastroenterology    PR UPPER GI ENDOSCOPY,BIOPSY N/A 08/27/2016    Procedure: UGI ENDOSCOPY; WITH BIOPSY, SINGLE OR MULTIPLE;  Surgeon: Arnold Long Mir, MD;  Location: PEDS PROCEDURE ROOM [facility];  Service: Gastroenterology    PR UPPER GI ENDOSCOPY,BIOPSY N/A 09/02/2017    Procedure: UGI ENDOSCOPY; WITH BIOPSY, SINGLE  OR MULTIPLE;  Surgeon: Arnold Long Mir, MD;  Location: PEDS PROCEDURE ROOM [facility];  Service: Gastroenterology    PR UPPER GI ENDOSCOPY,BIOPSY N/A 03/13/2020    Procedure: UGI ENDOSCOPY; WITH BIOPSY, SINGLE OR MULTIPLE;  Surgeon: Helyn Numbers, MD;  [facility]: [facility] [facility];  Service: Gastroenterology    PR UPPER GI ENDOSCOPY,BIOPSY N/A 09/05/2021    Procedure: UGI ENDOSCOPY; WITH BIOPSY, SINGLE OR MULTIPLE;  Surgeon: Wendall Papa, MD;  Location: GI PROC[facility]URES [facility];  Service: Gastroenterology    PR UPPER GI ENDOSCOPY,DIAGNOSIS N/A 01/20/2022    Procedure: UGI ENDO, INCLUDE ESOPHAGUS, STOMACH, & DUODENUM &/OR JEJUNUM; DX W/WO COLLECTION SPECIMN, BY BRUSH OR WASH;  Surgeon: Andrey Farmer, MD;  Location: GI PROCEDURES [facility];  Service: Gastroenterology    TUMOR REMOVAL      multiple-head, neck, back, hand, right flank, multiple         Medications:   Current Outpatient Medications   Medication Sig Dispense Refill    acetaminophen (TYLENOL) 500 MG tablet Take 2 tablets (1,000 mg total) by mouth every eight (8) hours.      buprenorphine-naloxone (SUBOXONE) 2-0.5 mg sublingual film Place 0.75 Film (1.5 mg of buprenorphine total) under the tongue three (3) times a day. Fill on or after: 12/26/22 68 Film 0    cholecalciferol, vitamin D3 25 mcg, 1,000 units,, 1,000 unit (25 mcg) tablet Take 1 tablet (25 mcg total) by mouth daily. 100 tablet 3    clobetasoL (TEMOVATE) 0.05 % ointment Apply topically two (2) times a day. To stubborn/thick rashes on hands/feet ears until clear/smooth. Restart as needed 60 g 3    COURIERED MED OR SUPPLY OGSVEO 50 MG TABLET. 1 each 0    escitalopram oxalate (LEXAPRO) 5 MG tablet TAKE 1 TABLET (5 MG TOTAL) BY MOUTH DAILY. 90 tablet 1    famotidine (PEPCID) 20 MG tablet Take 1 tablet (20 mg total) by mouth nightly. 30 tablet 0    hydrocortisone 2.5 % cream Apply topically two (2) times a day as needed (hemorrhoids). 30 g 0    lidocaine (ASPERCREME) 4 % patch Place 2 patches on the skin daily. 60 patch 0    LORazepam (ATIVAN) 0.5 MG tablet Take 2 tablets (1 mg total) by mouth Three (3) times a day as needed for anxiety.      mirtazapine (REMERON) 15 MG tablet Take 1 tablet (15 mg total) by mouth nightly. 90 tablet 3    naloxegol (MOVANTIK) 12.5 mg tablet Take 1 tablet (12.5 mg total) by mouth daily. 30 tablet 0    naloxone (NARCAN) 4 mg nasal spray One spray in either nostril once for known/suspected opioid overdose. May repeat every 2-3 minutes in alternating nostril til EMS arrives 2 each 0    nirogacestat (OGSIVEO) 50 mg tablet Take 3 tablets (150 mg total) by mouth two (2) times a day. Swallow tablets whole; do not break, crush, or chew. 180 tablet 5    ondansetron (ZOFRAN-ODT) 8 MG disintegrating tablet Dissolve 1 tablet (8 mg total) in the mouth every twelve (12) hours as needed for nausea. 60 tablet 0    oxyCODONE (ROXICODONE) 10 mg immediate release tablet Take 1 tablet (10 mg total) by mouth every eight (8) hours as needed for pain. Fill on or after: 01/01/23 90 tablet 0    pantoprazole (PROTONIX) 40 MG tablet Take 1 tablet (40 mg total) by mouth two (2) times a day. 60 tablet 0    pimecrolimus (ELIDEL) 1 % cream  Apply topically two (2) times a day as needed. To face as needed for rash      sucralfate (CARAFATE) 100 mg/mL suspension Take 10 mL (1 g total) by mouth four (4) times a day. 1200 mL 11    triamcinolone (KENALOG) 0.1 % cream Apply topically two (2) times a day as needed. Apply to rash as needed      zinc oxide-cod liver oil (DESITIN 40%) 40 % Pste Apply topically daily as needed. 57 g 0     No current facility-administered medications for this visit.         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  She is a full time Consulting civil engineer.  She lives in an apartment with a nursing friend at school, at home lives with mom and dad.  Hobbies include music, reading, watching medical dramas.   She is a non-smoker  Alcohol history: none    Family History:  family history includes Alcohol abuse in her paternal grandfather; Arthritis in her maternal grandfather; Asthma in her maternal grandfather; COPD in her paternal grandmother; Cancer in her maternal grandmother; Diabetes in her maternal grandmother; Hypertension in her maternal grandmother; Miscarriages / Stillbirths in her paternal grandmother; No Known Problems in her brother, father, maternal aunt, maternal uncle, mother, paternal aunt, paternal uncle, and sister; Other in her maternal grandmother; Stroke in her maternal grandmother; Thyroid disease in her maternal grandmother.   Detailed thrombosis family history above in HPI.    Review of Systems:  As per HPI, otherwise negative x 12 systems.    Objective :  Vitals:    01/14/23 1032   BP: 125/91   BP Site: L Arm   BP Position: Sitting   Pulse: 102   Temp: (!) 38.9 ??C (102 ??F)   TempSrc: Temporal   SpO2: 98%   Weight: 58 kg (127 lb 12.8 oz)   Height: 165.1 cm (5' 5)         Physical Exam:  GEN: Well-appearing female in no acute distress.  HEENT: Moist mucous membranes, normocephalic.   NEURO: A&Ox3, CNII-XII grossly intact.  PSY: Appropriate affect, reasonable insight and judgment.  Skin: Dry, warm, no rashes.      Test Results  Reviewed in Epic.    Arvella Merles, PA-C  Physician Assistant  Hematology/Oncology

## 2023-01-15 DIAGNOSIS — K91850 Pouchitis: Secondary | ICD-10-CM | POA: Diagnosis not present

## 2023-01-15 LAB — VITAMIN D 25 HYDROXY: VITAMIN D, TOTAL (25OH): 21.6 ng/mL (ref 20.0–80.0)

## 2023-01-16 DIAGNOSIS — K91850 Pouchitis: Secondary | ICD-10-CM | POA: Diagnosis not present

## 2023-01-17 DIAGNOSIS — K91850 Pouchitis: Secondary | ICD-10-CM | POA: Diagnosis not present

## 2023-01-18 DIAGNOSIS — K91850 Pouchitis: Secondary | ICD-10-CM | POA: Diagnosis not present

## 2023-01-19 DIAGNOSIS — K91850 Pouchitis: Secondary | ICD-10-CM | POA: Diagnosis not present

## 2023-01-20 DIAGNOSIS — K91850 Pouchitis: Secondary | ICD-10-CM | POA: Diagnosis not present

## 2023-01-20 NOTE — Unmapped (Signed)
Nutrition Plan:   Continue Osmolite 1.5 via ND as tolerated, 2 cartons per day at 50-75 ml/hr, with ultimate goal of discontinuing tube feeds  Provides 474 ml formula, 710 kcal, 30 grams protein, 96 grams carbohydrate, 0 grams fiber, 362 ml free water from formula.   Minimum flushes 30 ml q4h to maintain tube patency, with additional flushes as required to maintain hydration  Call number provided to you by Jobe Gibbon if you require refills of enteral supplies sooner than is scheduled   Aim to increase oral intakes as tolerated. Review handouts on low fiber diet, tips for increasing calories, and tips for adding protein sent in MyChart first visit and implement suggestions per individual tolerance. Generally would aim for small, frequent intakes throughout the day. Consider setting an alarm or timer if this helps remind you to eat at more routine intervals. High calorie/high protein liquids are likely to be an efficient way to add nutrition between meals.   Please keep a 3 day record of foods and beverages consumed (including portions) prior to next visit so that we can better quantify progress of oral intakes.See example of food/symptom diary sent via MyChart this visit for ideas of what notes may be helpful to include (although you can change the format if you'd like, so long as it contains details and portions of foods consumed- for example, instead of milk, would put 4 oz 2% Lactaid milk). You do not have to worry about calculating the calories, as this can be done by RD.   If the multivitamin you have is not well tolerated, consider chewable vitamin (e.g. Flintstones) as tolerated instead

## 2023-01-21 ENCOUNTER — Ambulatory Visit
Admit: 2023-01-21 | Discharge: 2023-01-22 | Payer: BLUE CROSS/BLUE SHIELD | Attending: Student in an Organized Health Care Education/Training Program | Primary: Student in an Organized Health Care Education/Training Program

## 2023-01-21 DIAGNOSIS — G894 Chronic pain syndrome: Principal | ICD-10-CM

## 2023-01-21 DIAGNOSIS — R1084 Generalized abdominal pain: Principal | ICD-10-CM

## 2023-01-21 DIAGNOSIS — R110 Nausea: Principal | ICD-10-CM

## 2023-01-21 DIAGNOSIS — R198 Other specified symptoms and signs involving the digestive system and abdomen: Principal | ICD-10-CM

## 2023-01-21 DIAGNOSIS — K91850 Pouchitis: Secondary | ICD-10-CM | POA: Diagnosis not present

## 2023-01-21 MED ORDER — ONDANSETRON 8 MG DISINTEGRATING TABLET
ORAL_TABLET | Freq: Two times a day (BID) | 0 refills | 30 days | Status: CN | PRN
Start: 2023-01-21 — End: 2023-02-20

## 2023-01-21 NOTE — Unmapped (Addendum)
 Chronic Pain Follow Up Note    Assessment and Plan    1. Chronic pain syndrome    2. Nausea    3. Generalized abdominal pain    4. Visceral hyperalgesia          [name] [name] is a 22 y.o. female with past medical history significant for DVT (RUE, s/p Eliquis x3 months), [name] syndrome s/p colectomy, desmoid tumor, anemia, and severe protein-calorie malnutrition who is being followed at [facility] for abdominal and right shoulder pain that is consistent with the diagnosis of cancer associated pain.    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 Pt has continued and now chronic pain related to her tumors, has R shoulder pain as well though this is better controlled now. Follows with [facility] Dr [name] as well as by [facility] oncology.    Unfortunately, [name] [name] has been experiencing significantly worse abdominal pain over the last several months.  The etiology of her pain flare cycles is complex with likely multiple contributing factors.  The most likely explanation appears that she has intermittent pain flares that are caused by an unclear underlying process (potentially due to intermittent intestinal obstructions in the setting of her known mesenteric desmoid tumors, which may act as anchor points for torsion of her bowels) that resulted in an increased opioid requirement and often will lead to hospitalization.  At this point, her acute pain will be treated with IV opioids which will precipitate narcotic bowel syndrome that, in conjunction with her severe visceral hyperalgesia, quickly causes a new pain generator.  This results in a positive feedback loop with deleterious consequences in which she requires more IV opioids to treat her pain but the additional IV opioids further the underlying issue.    Visceral hyperalgesia, narcotic bowel syndrome, desmoid tumors of the gastrointestinal tract, cancer related pain, chronic pain syndrome, allodynia, generalized abdominal pain   Chronic, globally worsening; at this time patient believes pain has worsened since she started eating again and will gradually begin to improve.  Patient was recently admitted from July until October after having a severe pain flare resulting from significant shaking and pain that occurred after emerging from general anesthesia which she required for a steroid injection of a peripheral desmoid tumor.  She was hospitalized for many months, having only been out of the hospital for a short period of time prior to an earlier hospitalization over the summer, and her most recent hospital course was complicated by prolonged ileus, headaches, severe nausea, significant deconditioning and lower extremity weakness, bacteremia and fungemia, and prolonged acute inpatient rehab stay. She has since been maintaining on oxycodone 10 mg 3 times daily, occasionally taking 4 total tablets per day, as well as Suboxone 1 mg 2 times daily. She states she had a week of improved pain following her previous appointment but has worsened since restarting Movantik. She seemed in good spirits today about becoming more independent and restarting school.  We will have her continue her oxycodone 10 mg 3 times daily and start taking suboxone 1 mg TID in order to get her pain under control at this time with a goal of weaning off oxycodone as able (hopefully in December) and will change her Suboxone to 0.5 mg three times daily at that time as well. We will see her back in clinic in 1 month as her ongoing pain picture is still fluctuating and  severe. The patient is currently taking p.o. ativan three times a week for her anxiety and knows not to take this near or at the same time as her oxycodone or her Suboxone dose. She has stopped taking diazepam. She plans to have her wisdom teeth removed this week and we discussed continuing her Suboxone throughout this. We will also give her 10 extra oxycodone tablets to help with this acute pain following the procedure, allowing her to take either 15 mg oxy q6h PRN or 10 mg oxy q4h PRN for the immediate post-procedure days. We also encouraged the use of Voltaren gel BID and lidocaine patches on her back. We talked about how we would not advise starting Lexapro iso active GI bleeding.    -Continue APAP 1000 mg 3 times daily  -Okay to continue ativan 0.5 mg p.o. prn for anxiety, patient will not take it at the same time as opioids or other sedating medications  -Off of gabapentinoids due to side effects  -Refilled oxycodone 10 mg 3 times daily as needed #100; x 1 month with plans to decrease to BID at the next visit  -Change Suboxone to 1 mg TID   -Still working to wean off of all opioids      Of note: The patient has received both ketamine infusions and lidocaine infusions as an inpatient for flareups of her chronic abdominal pain and states that lidocaine infusions have been significantly more helpful in the past, though she is also tolerated ketamine infusions. Would recommend this in future hospitalizations to treat severe and uncontrolled abdominal pain exacerbations -previous lidocaine infusion was intermittently on due to transaminitis experienced by the patient which may or may not have been from the infusion but from TPN or an intra-abdominal process that was occurring.  She states that she did really well after the lidocaine infusion for a couple of weeks.    Myofascial pain   Chronic, ongoing, at goal. Not primary concern at this time. Patient continues to have subacute low back pain after having hyper flexion of her hip in the hospital. Patient states the hematoma has not changed from previous. At previous appointment, on exam an approximately 5 inches x 4 inches with no skin discoloration at this time but a hard subcutaneous nodule overlying the SI joint.  [name] [name] was positive bilaterally.  Favor either hematoma versus gluteal tear versus SI joint pain.    - Consider repeat TPI PRN, though will hold off given her severe pain flare after recent desmoid injection  - Home exercise program  - Encouraged SI joint exercises  - Encouraged slow resumption of normal activity and times when pain has decreased  - Apply Voltaren gel BID to limit GI side effects  - Apply Lidocaine patches    Chronic, continuous use of opioids; chronic pain syndrome   Patient is managed on both Suboxone for long-acting pain control as a partial mu agonist and oxycodone for pain flares.  Working to wean patient off of oxycodone and then to work patient down and eventually off of buprenorphine to improve bowel function while still balancing the need for pain control.  Patient is due for an updated UDS and opioid agreement today as well as an updated Narcan prescription.  Patient is currently managed on a benzodiazepine for anxiety and muscle spasms but notes that she is only taking 1 pill three times a week and knows not to take this around the time of her opioid and is working to wean this off as  well.    -COMM score: 2    Medication Monitoring  [place] PDMP was reviewed today and it was appropriate.  Patient reported pill counts at home and they were appropriate  Last urine toxicology screen: Updated 12/23/2022  Treatment agreement last reviewed and signed: Updated 12/23/2022  Aberrancy with treatment plan? no  Opioid Risk: moderate (benzo)  Pain psychology: no  Benzodiazepines: yes, knows not to take alongside opioids  Naloxone: Yes, refilled on 12/23/2022  EKG: NSR with normal Qtc (437 on 11/01/2022)  I have reviewed the [facility] Medical Board statement on use of controlled substances for the treatment of pain as well as the CDC Guideline for Prescribing Opioids for Chronic Pain. I have reviewed the [place] Controlled Substance Monitoring Database.    Long Term Use of Opioid Medication  - Compliant, using opioid to facilitate functional goals, no concerns for misuse     PDMP reviewed 01/21/2023 Appropriate   Pill count 01/21/2023 Appropriate   Urine toxicology Date Appropriate   Treatment agreement Date Goals: continue with nursing school   Naloxone rx Date 12/23/22     COMM Date Score: 4   Benzodiazepine Yes    Pain Psychology No    QTC Date    Concerns No    Opioid Risk LOW                Return in about 4 weeks (around 02/18/2023).    PLAN:  Today I have prescribed:  Requested Prescriptions     Signed Prescriptions Disp Refills    oxyCODONE (ROXICODONE) 10 mg immediate release tablet 100 tablet 0     Sig: Take 1 tablet (10 mg total) by mouth every eight (8) hours as needed for pain. Fill on or after: 02/01/23     No orders of the defined types were placed in this encounter.     HPI  [name] [name] is a 22 y.o. being followed at [facility] for complaint of chronic pain localized to her shoulders and abdomen.      Patient was last seen in October when as outlined in more detail above and in her 10-24 discharge summary, [name] [name] was unfortunately hospitalized from mid July until early October due to severe abdominal pain complicated by ileus and the inability to tolerate p.o. medications along with new and diffuse muscle spasms of unclear etiology, headaches, nausea, syncope and later complicated by bacteremia and fungemia developed in the setting of the need for TPN and central line access.  She was treated with a ketamine infusion while admitted and had medications titrated both up and down, even being off all non-Suboxone opioids for 5-7 days at 1 point. Unfortunately and very understandably she noted that she had experience significant psychological trauma from her encounters with the medical system that of continued even after her discharge a few days ago and involved an issue with the paramedics called to her house for evaluation of dislodgment of her [place]. Nevertheless, she appeared to be in much better spirits and more like her normal self in clinic.  She noted that she was just trying to power through all of this despite discouragement about her ongoing pain, her continuing weight loss, her lack of adequate nutrition, and her ongoing nausea.  She also noted that it had been nearly impossible to sleep.  She had been needing her oxycodone up to 4 times per day and continued at her higher doses of Suboxone.  She had been using p.o. Valium for muscle  spasms and anxiety to good effect and taking this nightly she noted that she was not taking this at the same time as her opioids. Unfortunately, she also stated that she suffered severe right lower back pain while being evaluated in the hospital during a straight leg raise exam and that this had been persistent and made it even harder to move. She also noted that she was back on chemotherapy and she was receiving both hot flashes and her.  And this had been very difficult to manage in her primary is adding an SSRI back. She was also still waiting on Movantik to arrive and is out of this medication.    Today, patient states she was doing better for about a week but her back has progressively worsened. She states her back is still swollen and has been requiring her to use her wheelchair. She has not been sleeping. She states this is related to taking her movantik causing diarrhea and abdominal pain. She states she has an active ulcer in her J pouch. She states her pain has become excruciating since but she does believe once she adjusts her GI medications that her pain will improve again. She states she would like to start becoming more independent and has plans to go to [employer] nursing school. She is interested in establishing a plan for weaning her pain medicine so that she can eventually start spacing out her appointments. She states she took Lexapro once and was unsure if she felt side effects. Her mom is interested in her trying this medication again. She states she feels as if she is sleeping through when she needs pain medicine and is curious about restarting the Butrans patch.She has previously tried [employer] which were very helpful but they were discontinued during one of her hospitalizations.  She has not been able to do physical therapy and has not done HEP.     As for medications, she is no longer taking diazepam but does still take ativan occasionally for anxiety hives.   She takes Suboxone 1mg  BID. She states she always takes one at night before bed around 11pm.  She takes oxycodone 10mg  TID.     Current analgesic regimen:  -Suboxone 1 mg BID  -Oxycodone 10mg  q8 PRN  -P.o. Valium 5 mg nightly as needed  -Tylenol PRN  -Voltaren 1% gel    The patient states her pain is located abdomen and lower back and the severity of her pain ranges from 4/10 to 10/10.  Her pain currently is 6/10 and on average is 6.5/10.  She describes the sensation of her pain as nauseating, pulling, tender. Her pain is present all of the time and worst mornings. The patient???s pain impacts enjoyment of life, general activity, mood, recreational activities, relationships, sleep, walking, standing. Her interval history includes Mental Health visit. Her pain is better, and she does not have new pain to discuss today. She is not on blood thinners or anti-coagulants. In regards to medications currently taken for pain management, the patient is tolerating these medications well and complains of associated side effects: None.    Previous Medication Trials:  NSAIDS: Toradol (c/f GIB after)  Antidepressant/anxiety: Cymbalta (stopped due to mood side effects), amitriptyline (insomnia and increased J-pouch output)  Anticonvulsants: Gabapentin, Lyrica (both cause lower extremity edema)  Muscle relaxants: Robaxin, baclofen (most effective of the conventional therapies), p.o. Valium (attempting to limit due to opioid dependence)  Topicals: lidocaine, voltaren gel  Short-acting opiates: oxycodone, PO HM  Long-acting opiates: Buprenorphine  Other: Tylenol    Previous Interventions  Procedures:   - TPIs in August 2023 with initial pain flare, subsequent improvement, and near immediate relief flare of pain; hyperalgesic with procedure  - TPIs of Right trapezius, rhomboid, infraspinatus on 12/04/2021.  - TPIs on 10/25  - Celiac plexus block on 11/1 (severe post-procedural pain flare requiring admission for pain control, significant hyperalgesia)  - TPIs 11/22    Infusions:  Lidocaine infusions helped during inpatient exacerbations of pain more than ketamine infusions    PT: Yes    Allergies  Allergies   Allergen Reactions    Adhesive Tape-Silicones Hives and Rash     Paper tape and Tegederm ok. Biopatch causes blistering but has tolerated CHG Tegaderm    Ferrlecit [Sodium Ferric Gluconat-Sucrose] Swelling and Rash    Levofloxacin Swelling and Rash     Swelling in mouth, rash,     Methylnaltrexone      Per Patient: I lost bowel control, severe abdominal cramping, and elevated BP    Neomycin Swelling     Rxn after ear drops; ear swelling    Papaya Hives    Morphine Nausea And Vomiting    Zosyn [Piperacillin-Tazobactam] Itching and Rash     Red and itchy    Compazine [Prochlorperazine] Other (See Comments)     Extreme agitation    Iron Analogues     Iron Dextran Itching     Received iron dextran 06/08/22 over 12 hours, had itching and redness/flushing during the infusion and for a couple days after. Required IV benadryl w/flares between doses and ultimately treated w/IV methylpred for 2 days.     Latex, Natural Rubber Rash       Home Medications    Current Outpatient Medications   Medication Sig Dispense Refill    acetaminophen (TYLENOL) 500 MG tablet Take 2 tablets (1,000 mg total) by mouth every eight (8) hours.      buprenorphine-naloxone (SUBOXONE) 2-0.5 mg sublingual film Place 0.75 Film (1.5 mg of buprenorphine total) under the tongue three (3) times a day. Fill on or after: 12/26/22 68 Film 0    cholecalciferol, vitamin D3 25 mcg, 1,000 units,, 1,000 unit (25 mcg) tablet Take 1 tablet (25 mcg total) by mouth daily. 100 tablet 3    clobetasoL (TEMOVATE) 0.05 % ointment Apply topically two (2) times a day. To stubborn/thick rashes on hands/feet ears until clear/smooth. Restart as needed 60 g 3    COURIERED MED OR SUPPLY OGSVEO 50 MG TABLET. 1 each 0    escitalopram oxalate (LEXAPRO) 5 MG tablet TAKE 1 TABLET (5 MG TOTAL) BY MOUTH DAILY. 90 tablet 1    hydrocortisone 2.5 % cream Apply topically two (2) times a day as needed (hemorrhoids). 30 g 0    lidocaine (ASPERCREME) 4 % patch Place 2 patches on the skin daily. 60 patch 0    LORazepam (ATIVAN) 0.5 MG tablet Take 2 tablets (1 mg total) by mouth Three (3) times a day as needed for anxiety.      naloxegol (MOVANTIK) 12.5 mg tablet Take 1 tablet (12.5 mg total) by mouth daily. 30 tablet 0    naloxone (NARCAN) 4 mg nasal spray One spray in either nostril once for known/suspected opioid overdose. May repeat every 2-3 minutes in alternating nostril til EMS arrives 2 each 0    nirogacestat (OGSIVEO) 50 mg tablet Take 3 tablets (150 mg total) by mouth two (2) times a day. Swallow tablets whole; do not break, crush, or  chew. 180 tablet 5    ondansetron (ZOFRAN-ODT) 8 MG disintegrating tablet Dissolve 1 tablet (8 mg total) in the mouth every twelve (12) hours as needed for nausea. 60 tablet 0    pantoprazole (PROTONIX) 40 MG tablet Take 1 tablet (40 mg total) by mouth two (2) times a day. 60 tablet 0    pimecrolimus (ELIDEL) 1 % cream Apply topically two (2) times a day as needed. To face as needed for rash      sucralfate (CARAFATE) 100 mg/mL suspension Take 10 mL (1 g total) by mouth four (4) times a day. 1200 mL 11    triamcinolone (KENALOG) 0.1 % cream Apply topically two (2) times a day as needed. Apply to rash as needed      zinc oxide-cod liver oil (DESITIN 40%) 40 % Pste Apply topically daily as needed. 57 g 0    famotidine (PEPCID) 20 MG tablet Take 1 tablet (20 mg total) by mouth nightly. 30 tablet 0    mirtazapine (REMERON) 15 MG tablet Take 1 tablet (15 mg total) by mouth nightly. 90 tablet 3    [START ON 02/01/2023] oxyCODONE (ROXICODONE) 10 mg immediate release tablet Take 1 tablet (10 mg total) by mouth every eight (8) hours as needed for pain. Fill on or after: 02/01/23 100 tablet 0     No current facility-administered medications for this visit.       Review Of Systems  General weight loss, weight gain, fatigue, difficulty sleeping  Cardiovascular chest pain, shortness of breath with exertion  Gastrointestinal nausea, stomach pain, and diarrhea  Skin none  Endocrine increased sweating, hot flashes  Musculoskeletal back pain  Neurologic migraines/headaches  Psychiatric anxiety      Physical Exam    VITALS:   Vitals:    01/21/23 1033   BP: 123/83   Pulse: 98   Resp: 16   Temp: 36.7 ??C (98.1 ??F)   SpO2: 95%             Wt Readings from Last 3 Encounters:   01/21/23 58 kg (127 lb 12.8 oz)   01/14/23 58 kg (127 lb 12.8 oz)   12/16/22 58.5 kg (129 lb)     GENERAL:  The patient is well developed, well-nourished and appears to be in no apparent distress. The patient is pleasant and interactive. Patient is a good historian. She is sitting in a wheelchair  HEENT:    Reveals normocephalic/atraumatic.  RESPIRATORY:   Normal work of breathing.  No supplemental oxygen.  NEUROLOGIC:    The patient was alert and oriented, speech fluent, normal language.   SKIN:   No obvious rashes, lesions, or erythema. Deffered looking at her back  PSYCHIATRIC:  Appropriate mood and affect.  No pressured speech. She became tearful when discussing her pain    .

## 2023-01-21 NOTE — Unmapped (Signed)
Medication:buprenorphine /naloxine 2mg /0.5 mg film  SIG:0.75 mg TID  Quantity on RX: #60  Filled on:12/26/22  Pill count today: # 63    Medication:oxycodone 10 mg  SIG:1 tab Q 8 hr prn  Quantity on RX: #90  Filled on:01/02/23  Pill count today: # 35

## 2023-01-21 NOTE — Unmapped (Addendum)
Thank you for coming to see [name] today! We refilled your oxycodone with extra for your wisdom teeth removal.    We will see you back in one month!

## 2023-01-22 DIAGNOSIS — K91850 Pouchitis: Secondary | ICD-10-CM | POA: Diagnosis not present

## 2023-01-22 NOTE — Unmapped (Signed)
[facility] BONE AND SOFT TISSUE ONCOLOGY RETURN VISIT    Encounter Date: 01/23/2023  Patient Name: [name] [name]  Medical Record Number: [id number]    Referring Physician: Sheral Apley, MD  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    Desmoid fibromatosis, history of [name]     She has had numerous sites of disease, including paraspinal, chest wall, right arm/wrist. She has had several cryoablations with Dr. Cathie Hoops which have seemed to offer clinical benefit. She trialed sorafenib which seemed to lead to shrinkage of lesions, but she had significant intolerance with dizziness, presyncope, fatigue, hair loss, rash, GI side effects.      We have discussed at length the nature of desmoid fibromatosis tumors, the fact they can wax and wane independent of therapy, are not cancers in a metastatic potential, but can be locally infiltrative and cause substantial morbidity to the site of disease and occasionally mortality. I reviewed that the emerging standard of care is observation based on the fact that 20-30% will experience spontaneous regression in the year following diagnosis. Given her long experience with desmoids, complete regression may be less likely, but we will still approach this cautiously with parallel goals of avoidance of overtreatment (particularly surgical) and palliation of symptoms / preservation of function.     At first visit in 2022, her lesions are in the shoulder, right forearm, chest area, with a possible desmoid in her left thigh. Since then, she has had waxing waning course with significant pain issues, malnutrition requiring intermittent TPN and tube feeds, multiple GI bleeds, significant anxiety, depression, multiple hospitalizations. Had tumor response to sorafenib but developed GI bleeding and had to stop. IV iron infusion led to severe anaphylaxis.    09/16/22: Start nirogacestat.     09/25/22: Admitted with uncontrolled pain and muscle spasms after IR guided procedure. Prolonged hospital stay, neurologic evaluation, managed with muscle relaxers (baclofen, valium). Discharge 9/11 to rehab. Ramped up on nirogacestat with some side effects, discharged from rehab 10/2.    12/16/22: Tolerating nirogacestat reasonably well. Dealing with hot flashes, edema, nausea, muscle spasticity, anxiety, pain. Resumed mirtazepine and movantik for nausea, appetite, constipation. Resumed lexapro 5mg  for hot flashes and mood symptoms.    01/23/23:  Assessment & Plan  Desmoid Tumors  Patient on nirogacestat reports significant abdominal pain reduction but persistent back pain. Suspected new tumors in leg and shoulder blade. Discussed MRI and CT scans to evaluate tumor response and new growths. Risks of nirogacestat include altered taste and dry mouth; benefits include potential tumor size reduction. Alternative treatments include monitoring and potential surgical interventions.  - Order MRI of chest, abdomen, pelvis, and leg, schedule in December    Familial Adenomatous Polyposis ([name])  Patient with recent GI bleeding and polyp removal. Concerns about new polyps and cancer risk. Discussed CT colonography for small intestine evaluation. Risks include GI bleeding and new polyp formation; benefits include early detection and management. Alternative approaches include regular endoscopic surveillance.  - Discuss with Dr. [name] about potential CT colonography for small intestine evaluation    Chronic Pain  Patient experiencing significant back and abdominal pain, with some improvement. Concerns about pain interfering with daily function and physical therapy. Risks include decreased mobility and quality of life; benefits of pain  management include improved function and quality of life. Alternative approaches include physical therapy and non-opioid pain management strategies.  - Address physical therapy referral issues  - Continue current pain management regimen  - Monitor pain levels and adjust medications as needed    Nausea and GI Symptoms  Patient reports ongoing nausea, dry mouth, and altered taste, potentially medication-related. Discussed scopolamine patches and famotidine for symptom management. Risks of untreated symptoms include decreased quality of life and nutritional deficiencies; benefits include symptom relief and improved quality of life. Alternative approaches include dietary modifications and other antiemetic medications.  - Refill scopolamine patches  - Refill famotidine  - Consider artificial saliva or citrus for dry mouth    Tachycardia and Irregular Heartbeat  Patient reports episodes of tachycardia and irregular heartbeat with dizziness and shortness of breath. Potential SVT suspected. Discussed Zio patch for heart rhythm monitoring and potential beta blocker if SVT is confirmed. Risks of untreated SVT include persistent symptoms and serious cardiac events; benefits of monitoring and treatment include symptom relief and prevention of complications.  - Order Zio patch for 3-7 days to monitor heart rhythm  - Repeat Echo given prior borderline EF  - Consider beta blocker if SVT is confirmed    Mental Health Concerns  Patient experiencing depression, stress hives, and anxiety, exacerbated by the departure of a key care team member. Reports difficulty sleeping and high stress levels. Discussed potential benefits and risks of Lexapro for hot flashes and mental health support. Alternative approaches include other SSRIs or non-pharmacological interventions such as therapy.  - Ensure connection with a new mental health provider  - Consider re-evaluating Lexapro for hot flashes and mental health support, need to re-engage with psychiatry    General Health Maintenance  Routine health maintenance discussed. Confirmed benign colloid cysts on recent thyroid scan.  - Confirm thyroid scan results (benign colloid cysts)    Follow-up  - Schedule follow-up appointment in one month.    Plan:  -RTC 1 month for tox check + scans    I personally reviewed the medical records, pathology and laboratory results and viewed the imaging. All questions were answered to the patient's apparent satisfaction and they voiced understanding and agreement with the plan. Pt has my card and contact information and is encouraged to reach out with any further questions.    Donzetta Sprung, MD/MPH  Assistant Professor  Bone and Soft Tissue Oncology Program  [facility]  Pager: [phone]  Nurse Navigator: Roseanne Reno RN, BSN, OCN      REASON FOR CONSULTATION:   [name] [name] is a 22 y.o. female who is seen in consultation at the request of [name] Jonita Albee, MD for evaluation of her desmoid fibromatosis    HISTORY OF PRESENT ILLNESS:      Attends school at [employer] in [place], [place]      Right arm desmoid is the most bothersome - significant arm cramping, deep aching pain, lasts for ~30 min after cramping starts. Has noticed right hand shaking frequently. 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     Left chest lesion used to be quite uncomfortable, caused swelling in her arm and discomfort. Improved after cryoablation.     Right shoulder blade hurts when she leans back against it, fairly painful. Right chest wall lesion has some associated pain, improving since cryo.     First diagnosed at 22 years old - seen in [place] at first, had numerous cysts. First major issues  were two paraspinal lesions, referred to [facility] for biopsy and ultimately resection.  Soon diagnosed with [name], seen by medical genetics early on.  Guided by Dr. Debbe Mounts, referred out to proceduralists as needed.  2011 - had a growing lesion on her back as well as bilateral neck lesions, resected at [facility], found to be desmoid  02/2012 - surgery for thoracic spine desmoid. Maybe another flank lesion removed  2017 - wrist lesion treated by [facility] with bupivicaine, kenalog  08/2016 - endoscopy with adenomatous polyp of the colon, non-dysplastic  02/2017 - subcutaneous lesion resection by plastic surgery (postauricular and RUE, RLE) - path revealed epidermal inclusion cysts, osteomas  05/2018: cryotherapy of left anterior chest wall lesion  05/2018: started sulindac but struggled with adherence due to GI side effects  08/2018: cryo #2 to chest wall lesion  12/10/2018: started sorafenib due to progression in left paraspinal lesion, ?posterior chest wall lesion  02/2019: discontinued sorafenib for several reasons: fatigue, dizziness, nausea, hair loss, as well as COVID infection in late November. Derm consulted for rash / hair loss, started on antihistamines, topical steroids. Trialed lower dose but ultimately stopped again 05/2019 due to toxicity.  06/2019: follow up with surgical oncology - plans for observation rather than repeat surgery  07/2019: cryoablation of anterior chest wall desmoid  07/2019: excision of subcutaneous LE mass and L neck masses, clinically appeared consistent with cysts  08/2019: cryoablation of R posterior chest wall, left paraspinal desmoid     Interval History:  History of Present Illness  The patient is a 22 year old woman with a history of desmoid tumors and familial adenomatous polyposis, currently on nirogacestat for tumor management. She has been experiencing significant challenges with cancer malnutrition, gastrointestinal issues, and depression. The patient reports persistent pain, which is particularly severe in the back and abdomen, and has been impacting her ability to function normally. Despite reducing her pain medication intake, the pain remains a significant issue.    The patient has been experiencing nausea, which has been somewhat managed with scopolamine patches and famotidine. However, she reports that she is running out of these medications. She also reports experiencing hot flashes, which were previously managed with Celexapril. However, this medication made her feel drowsy and agitated, and there are concerns about potential gastrointestinal bleeding associated with its use.    The patient has been experiencing significant gastrointestinal issues, including diarrhea and gastrointestinal bleeding. She reports that these symptoms worsened after starting Movantik, leading her to discontinue the medication. She also reports that her food intake has decreased due to a combination of gastrointestinal issues and changes in taste, which she believes may be related to her medication.    The patient has been experiencing cardiac symptoms, including an elevated heart rate and irregular heartbeat. She reports feeling lightheaded, short of breath, and as though she is going to pass out. She has been using a wheelchair and a rollator for mobility due to these symptoms and her ongoing pain issues.    The patient also reports new growths in various locations, including her shoulder blade and leg, which are causing pain. She expresses concern about the possibility of undetected cancer, given her family history and recent changes in her health. She has been experiencing dry mouth and changes in hair growth, which she believes may be side effects of her medication.    The patient's overall health has been deteriorating, and she expresses significant concern about her ability to function normally. She reports feeling uneasy about her body  and what is happening to it. Despite these challenges, she has been actively engaged in her care and has been working with her healthcare team to manage her symptoms and improve her health.      REVIEW OF SYSTEMS:  A comprehensive review of 12 systems was negative except for pertinent positives noted in HPI.    Past Medical History, Surgical History, Family History were reviewed personally. Any changes were updated as above.    Social History:  Attended [employer] in [place], has been interrupted by health issues  Wants to be a Engineer, civil (consulting) in pediatric hematology/oncology   Mother Natalia Leatherwood is a strong advocate, often present at visits    Pregnancy status: premenopausal, interested in future children    ALLERGIES/MEDICATIONS:  Reviewed in Epic    PHYSICAL EXAM:   BP 130/76  - Pulse 92  - Temp 36.6 ??C (97.8 ??F) (Temporal)  - Ht 165.1 cm (5' 5)  - Wt 57.4 kg (126 lb 9.6 oz)  - SpO2 97%  - BMI 21.07 kg/m??   ECOG 2  Gen - well appearing, mother accompanying her   Eyes - conjunctivae clear, PERRL  ENT -no scleral icterus, moist mucous membranes  Lymph - no cervical or supraclavicular lymphadenopathy appreciated  Resp - clear to auscultation bilaterally, no wheezes, crackles or rales  CV - normal rate, regular rhythm, no lower extremity edema noted  Skin - flushing of skin noted on chest  Neuro - AOx3, EOM intact, no facial droop, speech fluent and coherent, moves all extremities without asymmetry  Psych - affect anxious  MSK - no joint tenderness or swelling    PATHOLOGY:   Pathology was personally reviewed as described in the HPI, detailed in Epic.     LABS:  Reviewed in Epic    RADIOLOGY:  Imaging was personally viewed and interpreted as summarized in the history (see above).

## 2023-01-22 NOTE — Unmapped (Signed)
Addended by: Jolyn Lent. on: 01/22/2023 07:01 AM     Modules accepted: Level of Service

## 2023-01-22 NOTE — Unmapped (Signed)
[facility] Refill Coordination Note    Specialty Medication(s) to be Shipped:   Hematology/Oncology: Ogsiveo 50mg     Other medication(s) to be shipped: No additional medications requested for fill at this time     [name] [name], DOB: Apr 09, 2000  Phone: [phone] (home) [phone] (work)      All above HIPAA information was verified with patient's family member, [name] [name].     Was a Nurse, learning disability used for this call? No    Completed refill call assessment today to schedule patient's medication shipment from the [facility]  [phone]).  All relevant notes have been reviewed.     Specialty medication(s) and dose(s) confirmed: Regimen is correct and unchanged.   Changes to medications: [name] [name] reports no changes at this time.  Changes to insurance: No  New side effects reported not previously addressed with a pharmacist or physician: None reported  Questions for the pharmacist: No    Confirmed patient received a Conservation officer, historic buildings and a Surveyor, mining with first shipment. The patient will receive a drug information handout for each medication shipped and additional FDA Medication Guides as required.       DISEASE/MEDICATION-SPECIFIC INFORMATION        N/A    SPECIALTY MEDICATION ADHERENCE     Medication Adherence    Patient reported X missed doses in the last month: 2  Specialty Medication: OGSIVEO 50 mg tablet (nirogacestat)  Patient is on additional specialty medications: No  Patient is on more than two specialty medications: No  Any gaps in refill history greater than 2 weeks in the last 3 months: no  Demonstrates understanding of importance of adherence: yes              Were doses missed due to medication being on hold? No     OGSIVEO 50 mg: 10 days of medicine on hand       REFERRAL TO PHARMACIST     Referral to the pharmacist: Not needed      [facility]     Shipping address confirmed in Epic.       Delivery Scheduled: Yes, Expected medication delivery date: 01/28/23.     Medication will be delivered via UPS to the prescription address in Epic WAM.    [name] [name]   [facility]  Specialty Technician

## 2023-01-23 ENCOUNTER — Ambulatory Visit: Admit: 2023-01-23 | Discharge: 2023-01-24 | Payer: BLUE CROSS/BLUE SHIELD

## 2023-01-23 ENCOUNTER — Other Ambulatory Visit: Admit: 2023-01-23 | Discharge: 2023-01-24 | Payer: BLUE CROSS/BLUE SHIELD

## 2023-01-23 DIAGNOSIS — D48119 Desmoid tumor of unspecified site: Secondary | ICD-10-CM | POA: Diagnosis not present

## 2023-01-23 DIAGNOSIS — K56609 Unspecified intestinal obstruction, unspecified as to partial versus complete obstruction: Principal | ICD-10-CM

## 2023-01-23 DIAGNOSIS — G893 Neoplasm related pain (acute) (chronic): Principal | ICD-10-CM

## 2023-01-23 DIAGNOSIS — Z79899 Other long term (current) drug therapy: Principal | ICD-10-CM

## 2023-01-23 DIAGNOSIS — F331 Major depressive disorder, recurrent, moderate: Principal | ICD-10-CM

## 2023-01-23 DIAGNOSIS — K567 Ileus, unspecified: Principal | ICD-10-CM

## 2023-01-23 DIAGNOSIS — K638219 Small intestinal bacterial overgrowth, unspecified: Secondary | ICD-10-CM | POA: Diagnosis not present

## 2023-01-23 DIAGNOSIS — E43 Unspecified severe protein-calorie malnutrition: Principal | ICD-10-CM

## 2023-01-23 DIAGNOSIS — I82A11 Acute embolism and thrombosis of right axillary vein: Principal | ICD-10-CM

## 2023-01-23 DIAGNOSIS — D485 Neoplasm of uncertain behavior of skin: Principal | ICD-10-CM

## 2023-01-23 DIAGNOSIS — D1391 Familial adenomatous polyposis: Secondary | ICD-10-CM | POA: Diagnosis not present

## 2023-01-23 DIAGNOSIS — K91850 Pouchitis: Secondary | ICD-10-CM | POA: Diagnosis not present

## 2023-01-23 DIAGNOSIS — G8929 Other chronic pain: Secondary | ICD-10-CM | POA: Diagnosis not present

## 2023-01-23 LAB — CBC W/ AUTO DIFF
BASOPHILS ABSOLUTE COUNT: 0 10*9/L (ref 0.0–0.1)
BASOPHILS RELATIVE PERCENT: 0.4 %
EOSINOPHILS ABSOLUTE COUNT: 0.4 10*9/L (ref 0.0–0.5)
EOSINOPHILS RELATIVE PERCENT: 3.8 %
HEMATOCRIT: 40.3 % (ref 34.0–44.0)
HEMOGLOBIN: 13.5 g/dL (ref 11.3–14.9)
LYMPHOCYTES ABSOLUTE COUNT: 2.1 10*9/L (ref 1.1–3.6)
LYMPHOCYTES RELATIVE PERCENT: 22.5 %
MEAN CORPUSCULAR HEMOGLOBIN CONC: 33.6 g/dL (ref 32.0–36.0)
MEAN CORPUSCULAR HEMOGLOBIN: 26.9 pg (ref 25.9–32.4)
MEAN CORPUSCULAR VOLUME: 80.1 fL (ref 77.6–95.7)
MEAN PLATELET VOLUME: 10.4 fL (ref 6.8–10.7)
MONOCYTES ABSOLUTE COUNT: 0.6 10*9/L (ref 0.3–0.8)
MONOCYTES RELATIVE PERCENT: 5.9 %
NEUTROPHILS ABSOLUTE COUNT: 6.4 10*9/L (ref 1.8–7.8)
NEUTROPHILS RELATIVE PERCENT: 67.4 %
PLATELET COUNT: 258 10*9/L (ref 150–450)
RED BLOOD CELL COUNT: 5.03 10*12/L (ref 3.95–5.13)
RED CELL DISTRIBUTION WIDTH: 14.1 % (ref 12.2–15.2)
WBC ADJUSTED: 9.5 10*9/L (ref 3.6–11.2)

## 2023-01-23 LAB — COMPREHENSIVE METABOLIC PANEL
ALBUMIN: 4 g/dL (ref 3.4–5.0)
ALKALINE PHOSPHATASE: 85 U/L (ref 46–116)
ALT (SGPT): 18 U/L (ref 10–49)
ANION GAP: 4 mmol/L — ABNORMAL LOW (ref 5–14)
AST (SGOT): 19 U/L (ref <=34)
BILIRUBIN TOTAL: 0.3 mg/dL (ref 0.3–1.2)
BLOOD UREA NITROGEN: 10 mg/dL (ref 9–23)
BUN / CREAT RATIO: 21
CALCIUM: 9.8 mg/dL (ref 8.7–10.4)
CHLORIDE: 105 mmol/L (ref 98–107)
CO2: 30 mmol/L (ref 20.0–31.0)
CREATININE: 0.48 mg/dL — ABNORMAL LOW
EGFR CKD-EPI (2021) FEMALE: >90 mL/min/{1.73_m2} (ref >=60)
GLUCOSE RANDOM: 135 mg/dL (ref 70–179)
POTASSIUM: 3.4 mmol/L (ref 3.4–4.8)
PROTEIN TOTAL: 8.1 g/dL (ref 5.7–8.2)
SODIUM: 139 mmol/L (ref 135–145)

## 2023-01-23 LAB — MAGNESIUM: MAGNESIUM: 2.4 mg/dL (ref 1.6–2.6)

## 2023-01-23 MED ORDER — FAMOTIDINE 20 MG TABLET
ORAL_TABLET | Freq: Every evening | ORAL | 3 refills | 30 days | Status: CP
Start: 2023-01-23 — End: 2023-05-23

## 2023-01-23 MED ORDER — SCOPOLAMINE 1 MG OVER 3 DAYS TRANSDERMAL PATCH
MEDICATED_PATCH | TRANSDERMAL | 2 refills | 30 days | Status: CP
Start: 2023-01-23 — End: 2023-04-23

## 2023-01-24 DIAGNOSIS — R1084 Generalized abdominal pain: Secondary | ICD-10-CM | POA: Diagnosis not present

## 2023-01-24 DIAGNOSIS — K91850 Pouchitis: Secondary | ICD-10-CM | POA: Diagnosis not present

## 2023-01-24 DIAGNOSIS — Q8789 Other specified congenital malformation syndromes, not elsewhere classified: Secondary | ICD-10-CM | POA: Diagnosis not present

## 2023-01-24 DIAGNOSIS — R109 Unspecified abdominal pain: Secondary | ICD-10-CM | POA: Diagnosis not present

## 2023-01-25 DIAGNOSIS — K91850 Pouchitis: Secondary | ICD-10-CM | POA: Diagnosis not present

## 2023-01-25 DIAGNOSIS — R109 Unspecified abdominal pain: Secondary | ICD-10-CM | POA: Diagnosis not present

## 2023-01-25 DIAGNOSIS — Q8789 Other specified congenital malformation syndromes, not elsewhere classified: Secondary | ICD-10-CM | POA: Diagnosis not present

## 2023-01-25 DIAGNOSIS — R1084 Generalized abdominal pain: Secondary | ICD-10-CM | POA: Diagnosis not present

## 2023-01-26 DIAGNOSIS — Q8789 Other specified congenital malformation syndromes, not elsewhere classified: Secondary | ICD-10-CM | POA: Diagnosis not present

## 2023-01-26 DIAGNOSIS — R1084 Generalized abdominal pain: Secondary | ICD-10-CM | POA: Diagnosis not present

## 2023-01-26 DIAGNOSIS — M545 Low back pain, unspecified: Secondary | ICD-10-CM | POA: Diagnosis not present

## 2023-01-26 DIAGNOSIS — K91850 Pouchitis: Secondary | ICD-10-CM | POA: Diagnosis not present

## 2023-01-26 DIAGNOSIS — R109 Unspecified abdominal pain: Secondary | ICD-10-CM | POA: Diagnosis not present

## 2023-01-27 DIAGNOSIS — R1084 Generalized abdominal pain: Secondary | ICD-10-CM | POA: Diagnosis not present

## 2023-01-27 DIAGNOSIS — Q8789 Other specified congenital malformation syndromes, not elsewhere classified: Secondary | ICD-10-CM | POA: Diagnosis not present

## 2023-01-27 DIAGNOSIS — K91850 Pouchitis: Secondary | ICD-10-CM | POA: Diagnosis not present

## 2023-01-27 DIAGNOSIS — R109 Unspecified abdominal pain: Secondary | ICD-10-CM | POA: Diagnosis not present

## 2023-01-27 MED FILL — OGSIVEO 50 MG TABLET: ORAL | 15 days supply | Qty: 90 | Fill #4

## 2023-01-28 DIAGNOSIS — R1084 Generalized abdominal pain: Secondary | ICD-10-CM | POA: Diagnosis not present

## 2023-01-28 DIAGNOSIS — Q8789 Other specified congenital malformation syndromes, not elsewhere classified: Secondary | ICD-10-CM | POA: Diagnosis not present

## 2023-01-28 DIAGNOSIS — K91850 Pouchitis: Secondary | ICD-10-CM | POA: Diagnosis not present

## 2023-01-28 DIAGNOSIS — R109 Unspecified abdominal pain: Secondary | ICD-10-CM | POA: Diagnosis not present

## 2023-01-29 DIAGNOSIS — D48119 Desmoid tumor: Principal | ICD-10-CM

## 2023-01-29 DIAGNOSIS — R1084 Generalized abdominal pain: Secondary | ICD-10-CM | POA: Diagnosis not present

## 2023-01-29 DIAGNOSIS — K91850 Pouchitis: Secondary | ICD-10-CM | POA: Diagnosis not present

## 2023-01-29 DIAGNOSIS — Q8789 Other specified congenital malformation syndromes, not elsewhere classified: Secondary | ICD-10-CM | POA: Diagnosis not present

## 2023-01-29 DIAGNOSIS — R109 Unspecified abdominal pain: Secondary | ICD-10-CM | POA: Diagnosis not present

## 2023-01-29 DIAGNOSIS — R5381 Other malaise: Secondary | ICD-10-CM | POA: Diagnosis not present

## 2023-01-29 NOTE — Unmapped (Signed)
Patient reports that she was able to have her wisdom teeth extraction with local anesthetic only but is, as expected, experiencing significant pain.  At the last visit, we discussed our plan to treat her postoperative pain and that this would be accomplished by adding additional tablets to her next oxycodone prescription.  However, at the rate that she is needing to use the oxycodone, 6 tablets/day as per our plan, she would actually run out prior to when I had originally sent in her prescription.  As this was my error and calculation, I will revise her prescription so that she may fill it on Friday, 01/30/2023.  PDMP was reviewed and is appropriate with last oxycodone fill on 01/02/2023.  Patient will be reevaluated in clinic on 02/18/2023.    Nat Christen, MD  Assistant Professor of Anesthesiology  Department of Anesthesiology   Divisions of General Anesthesiology and Pain Medicine

## 2023-01-30 ENCOUNTER — Telehealth: Admit: 2023-01-30 | Discharge: 2023-01-31 | Payer: BLUE CROSS/BLUE SHIELD

## 2023-01-30 DIAGNOSIS — D48119 Desmoid tumor: Principal | ICD-10-CM

## 2023-01-30 DIAGNOSIS — R109 Unspecified abdominal pain: Secondary | ICD-10-CM | POA: Diagnosis not present

## 2023-01-30 DIAGNOSIS — R1084 Generalized abdominal pain: Secondary | ICD-10-CM | POA: Diagnosis not present

## 2023-01-30 DIAGNOSIS — K91850 Pouchitis: Secondary | ICD-10-CM | POA: Diagnosis not present

## 2023-01-30 DIAGNOSIS — Q8789 Other specified congenital malformation syndromes, not elsewhere classified: Secondary | ICD-10-CM | POA: Diagnosis not present

## 2023-01-30 DIAGNOSIS — Z789 Other specified health status: Secondary | ICD-10-CM | POA: Diagnosis not present

## 2023-01-30 MED ORDER — OXYCODONE 10 MG TABLET
ORAL_TABLET | Freq: Three times a day (TID) | ORAL | 0 refills | 34 days | Status: CP | PRN
Start: 2023-01-30 — End: ?

## 2023-01-30 NOTE — Unmapped (Signed)
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 Admitted to [facility] on 09/25/2022 for ileus, inability to tolerate PO now on supplemental tube feed, and diffuse pain due to muscle spasms of uncertain etiology. She was discharged home 12/10/22 with a corpak in place and tube feeds. I reviewed Trula Ore Proch's excellent recommendations and answered a number of nutrition questions for Namiyah and her mom. All questions answered. Follow up within 1 month.     The patient reports they are physically located in [place] and is currently: at home. I conducted a audio/video visit. I spent  59m 04s on the video call with the patient. I spent an additional 15 minutes on pre- and post-visit activities on the date of service. 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 Admitted to [facility] on 09/25/2022 for ileus, inability to tolerate PO now on supplemental tube feed, and diffuse pain due to muscle spasms of uncertain etiology. She was discharged home 12/10/22 with a corpak in place and with a plan for tube feeds. She was last seen 01/02/23. In the interim, she had her wisdom teeth removed. Given the discomfort associated with having her wisdom teeth removed, she has been unable to eat much by mouth and has been using the corpak more for tube feeds. This was a set back for her PO nutrition but her wisdom teeth were impacted/erupting and it was long overdue. She is glad to have them out and is recovering well. She is traveling to [place] for the Thanksgiving holiday with her family. She is excited to travel but nervous.      Medications:  Reviewed in Epic

## 2023-01-31 DIAGNOSIS — R109 Unspecified abdominal pain: Secondary | ICD-10-CM | POA: Diagnosis not present

## 2023-01-31 DIAGNOSIS — Q8789 Other specified congenital malformation syndromes, not elsewhere classified: Secondary | ICD-10-CM | POA: Diagnosis not present

## 2023-01-31 DIAGNOSIS — R1084 Generalized abdominal pain: Secondary | ICD-10-CM | POA: Diagnosis not present

## 2023-01-31 DIAGNOSIS — K91850 Pouchitis: Secondary | ICD-10-CM | POA: Diagnosis not present

## 2023-01-31 MED ORDER — BUPRENORPHINE 2 MG-NALOXONE 0.5 MG SUBLINGUAL FILM
ORAL_FILM | Freq: Three times a day (TID) | SUBLINGUAL | 0 refills | 30 days | Status: CN
Start: 2023-01-31 — End: ?

## 2023-01-31 NOTE — Unmapped (Signed)
Adolescent and Young Adult Cancer Program Visit     Service date: January 23, 2023    Encounter location: Multidisciplinary Clinic    Clinician: [name], LCSW - AYA Clinical Social Worker    Patient identifiers: [name] [name] is a 22 y.o. with desmoid tumors and [name]. [name] [name] uses she/her pronouns.      Additional visit participants: pt's mother, Dr. [name]    Visit Summary     [facility] on 11/8 to share news that I will be leaving [facility] and to facilitate transfer of care. Discussed at length, provided support, and collaboratively decided on a plan for transfer of care.    Met with [name] [name] and her mom today in clinic with Dr. [name]. Spoke one on one with [name] [name] afterward to provide emotional support around ongoing health challenges, care transitions, and other topics. [name] [name] reports continued anxiety and some feelings of depression and isolation, but states that she feels much more hopeful about her future than she did during her last admission.    Follow Up/Plan:     Discussed psychiatry follow up with [facility]; [name] [name] to assume care.   Provided Ai with information for a community therapist via MyChart.    Our program will continue to be available to this patient for AYA-appropriate support on an as-needed basis. The patient has our contact information ([email or web address] and has been encouraged to contact us as needed.       01/30/2023     [name], LCSW  Adolescent and Young Adult Clinical Social Worker

## 2023-02-01 DIAGNOSIS — Q8789 Other specified congenital malformation syndromes, not elsewhere classified: Secondary | ICD-10-CM | POA: Diagnosis not present

## 2023-02-01 DIAGNOSIS — K91850 Pouchitis: Secondary | ICD-10-CM | POA: Diagnosis not present

## 2023-02-01 DIAGNOSIS — R109 Unspecified abdominal pain: Secondary | ICD-10-CM | POA: Diagnosis not present

## 2023-02-01 DIAGNOSIS — R1084 Generalized abdominal pain: Secondary | ICD-10-CM | POA: Diagnosis not present

## 2023-02-01 MED ORDER — OXYCODONE 10 MG TABLET
ORAL_TABLET | Freq: Three times a day (TID) | ORAL | 0 refills | 34 days | Status: CP | PRN
Start: 2023-02-01 — End: ?

## 2023-02-02 ENCOUNTER — Ambulatory Visit: Admit: 2023-02-02 | Discharge: 2023-02-03 | Payer: BLUE CROSS/BLUE SHIELD

## 2023-02-02 DIAGNOSIS — D48119 Desmoid tumor of unspecified site: Secondary | ICD-10-CM | POA: Diagnosis not present

## 2023-02-02 DIAGNOSIS — R1084 Generalized abdominal pain: Secondary | ICD-10-CM | POA: Diagnosis not present

## 2023-02-02 DIAGNOSIS — R109 Unspecified abdominal pain: Secondary | ICD-10-CM | POA: Diagnosis not present

## 2023-02-02 DIAGNOSIS — K91850 Pouchitis: Secondary | ICD-10-CM | POA: Diagnosis not present

## 2023-02-02 DIAGNOSIS — R55 Syncope and collapse: Secondary | ICD-10-CM | POA: Diagnosis not present

## 2023-02-02 DIAGNOSIS — R000 Tachycardia, unspecified: Secondary | ICD-10-CM | POA: Diagnosis not present

## 2023-02-02 DIAGNOSIS — Q8789 Other specified congenital malformation syndromes, not elsewhere classified: Secondary | ICD-10-CM | POA: Diagnosis not present

## 2023-02-03 DIAGNOSIS — Q8789 Other specified congenital malformation syndromes, not elsewhere classified: Secondary | ICD-10-CM | POA: Diagnosis not present

## 2023-02-03 DIAGNOSIS — M6281 Muscle weakness (generalized): Secondary | ICD-10-CM | POA: Diagnosis not present

## 2023-02-03 DIAGNOSIS — R109 Unspecified abdominal pain: Secondary | ICD-10-CM | POA: Diagnosis not present

## 2023-02-03 DIAGNOSIS — K91850 Pouchitis: Secondary | ICD-10-CM | POA: Diagnosis not present

## 2023-02-03 DIAGNOSIS — R1084 Generalized abdominal pain: Secondary | ICD-10-CM | POA: Diagnosis not present

## 2023-02-03 NOTE — Unmapped (Signed)
[facility]  CONSULT NOTE - INFLAMMATORY BOWEL DISEASE  02/10/2023    Demographics:  [name] [name] is a 22 y.o. year old female    Referring physician:   Pcp, None Per Patient  [address] [place],  [place] [address]    Patient Care Team:  Jarold Motto, [facility] as PCP - General  Debbe Mounts, MD as Consulting Physician (Peds: Pediatric Hematology-Oncology)  Cherlynn Perches, MD as Consulting Physician (Orthopedic Surgery)  Jobe Gibbon, MD as Consulting Physician (Interventional Radiology)  Twana First, MD as Consulting Physician  Tatton-Howard, Clelia Croft, RN as Registered Nurse (Oncology Navigator)  Lenise Arena, Judye Bos, MD as Attending Provider (Surgical Oncology)  Clarene Duke, MD as Consulting Physician (Plastic Surgery)  Helyn Numbers, MD as Consulting Physician (Gastroenterology)  Childers, Biagio Quint, MD as Consulting Physician (Palliative Medicine)  Belva Bertin, RN as Registered Nurse (Palliative Medicine)  Lajuana Matte, RN as Registered Nurse (Oncology Navigator)  Sheral Apley, MD (Medical Oncology)  Lajuana Matte, RN as Registered Nurse (Oncology Navigator)  [name], MD as Fellow (Psychiatry)           HPI / NOTE :     Today, I saw [name] [name] for follow-up consultation in the [facility].  Outside records including available clinical notes, endoscopy reports, imaging results and pathology results were reviewed in detail as part of this follow-up consultation.       Chief complaint: Follow-up after hospitalization    HPI:    121 pounds    Overall improving, is able to eat, but only very specific food items and was not doing well last week, lost 5 pounds of weight (now 121 pounds). Still on enteral nutrition with Corpak (Osmolite 1.5 as tolerated, 2 cartons per day at 50-75 ml/hr) . Carafate helps with esophageal burning    Pain therapy for abdominal pain, chronic pain syndrome , diffuse myofascial pain : managed in pain clinic, off  diazepam , baclofen.  .   Oxycodone 10 mg three times daily, suboxone 0.75 tid (tries to take once daily)    Anxiety/nausea : Phenergan 12.5 mg as needed (not often), ondansetron as needed mirtazapine 7.5  mg.     Desmoid therapy: Off sorafenib, which may have caused bleeding in past    Nutrition: with nutrition in regular follow-up now    Pouch: More formed now 8-15 , depending on food intake. Movantik daily causes to much diarrhea, reduction to as needed when she feels constipated which is < 4 bowel movements .   Currently increasing leakage.     Review of Systems: positive for [name]  Otherwise, the balance of 10 systems is negative.    Bowel frequency /24 hours  8-15  Bowel frequency during night 0-1  Blood in bowel movements none  Abdominal pain  Moderate = 2  Bloating frequent  Incontinence 1-2 week  Bodyweight decreasing  Loperamide No  Lomotil No  Hyoscyamine:  No  Antibiotic none            Past Medical History:   Past medical history:   Past Medical History:   Diagnosis Date    Abdominal pain     Acid reflux     occas    Anesthesia complication     itching, shaking, coldness; last few surgeries have gone much better    Cancer (CMS-HCC)     Cataract of right eye  COVID-19 virus infection 01/2019    Cyst of thyroid determined by ultrasound     monitoring    [name]     2 right forearm, 1 left thigh, 1 right scapula, 1 under left clavicle; multiple    Difficult intravenous access     [name] (familial adenomatous polyposis)     [name]     Gastric polyps     History of chemotherapy     last treatment approx 05/2019    History of colon polyps     History of COVID-19 01/2019    Iron deficiency anemia due to chronic blood loss     received iron infusion 11-2019    PONV (postoperative nausea and vomiting)     Rectal bleeding     Syncopal episodes     especially if becoming dehydrated         Family history:   Family History   Problem Relation Age of Onset    No Known Problems Mother     No Known Problems Father     No Known Problems Sister     No Known Problems Brother     Stroke Maternal Grandmother     Other Maternal Grandmother         benign lesions of liver and pancreas, further details unknown    Cancer Maternal Grandmother     Diabetes Maternal Grandmother     Hypertension Maternal Grandmother     Thyroid disease Maternal Grandmother     Arthritis Maternal Grandfather     Asthma Maternal Grandfather     COPD Paternal Grandmother         Deceased    Miscarriages / Stillbirths Paternal Grandmother     Alcohol abuse Paternal Grandfather         Deceased    No Known Problems Maternal Aunt     No Known Problems Maternal Uncle     No Known Problems Paternal Aunt     No Known Problems Paternal Uncle     Anesthesia problems Neg Hx     Broken bones Neg Hx     Cancer Neg Hx     Clotting disorder Neg Hx     Collagen disease Neg Hx     Diabetes Neg Hx     Dislocations Neg Hx     Fibromyalgia Neg Hx     Gout Neg Hx     Hemophilia Neg Hx     Osteoporosis Neg Hx     Rheumatologic disease Neg Hx     Scoliosis Neg Hx     Severe sprains Neg Hx     Sickle cell anemia Neg Hx     Spinal Compression Fracture Neg Hx     Melanoma Neg Hx     Basal cell carcinoma Neg Hx     Squamous cell carcinoma Neg Hx      Social history:   Social History     Socioeconomic History    Marital status: Single   Tobacco Use    Smoking status: Never     Passive exposure: Past    Smokeless tobacco: Never   Vaping Use    Vaping status: Never Used   Substance and Sexual Activity    Alcohol use: Never    Drug use: Never    Sexual activity: Never   Other Topics Concern    Do you use sunscreen? Yes    Tanning bed use? No    Are you easily burned?  No    Excessive sun exposure? No    Blistering sunburns? 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     Social Drivers of Psychologist, prison and probation services Strain: Low Risk (09/26/2022)    Overall Financial Resource Strain (CARDIA)     Difficulty of Paying Living Expenses: Not very hard   Food Insecurity: No Food Insecurity (09/26/2022)    Hunger Vital Sign     Worried About Running Out of Food in the Last Year: Never true     Ran Out of Food in the Last Year: Never true   Transportation Needs: No Transportation Needs (12/10/2022)    PRAPARE - Therapist, art (Medical): No     Lack of Transportation (Non-Medical): No             Allergies:     Allergies   Allergen Reactions    Adhesive Tape-Silicones Hives and Rash     Paper tape and Tegederm ok. Biopatch causes blistering but has tolerated CHG Tegaderm    Ferrlecit [Sodium Ferric Gluconat-Sucrose] Swelling and Rash    Levofloxacin Swelling and Rash     Swelling in mouth, rash,     Methylnaltrexone      Per Patient: I lost bowel control, severe abdominal cramping, and elevated BP    Neomycin Swelling     Rxn after ear drops; ear swelling    Papaya Hives    Morphine Nausea And Vomiting    Zosyn [Piperacillin-Tazobactam] Itching and Rash     Red and itchy    Compazine [Prochlorperazine] Other (See Comments)     Extreme agitation    Iron Analogues     Iron Dextran Itching     Received iron dextran 06/08/22 over 12 hours, had itching and redness/flushing during the infusion and for a couple days after. Required IV benadryl w/flares between doses and ultimately treated w/IV methylpred for 2 days.     Latex, Natural Rubber Rash             Medications:     Current Outpatient Medications   Medication Sig Dispense Refill    acetaminophen (TYLENOL) 500 MG tablet Take 2 tablets (1,000 mg total) by mouth every eight (8) hours.      buprenorphine-naloxone (SUBOXONE) 2-0.5 mg sublingual film Place 0.75 Film (1.5 mg of buprenorphine total) under the tongue three (3) times a day. Fill on or after: 12/26/22 68 Film 0    cholecalciferol, vitamin D3 25 mcg, 1,000 units,, 1,000 unit (25 mcg) tablet Take 1 tablet (25 mcg total) by mouth daily. 100 tablet 3    COURIERED MED OR SUPPLY OGSVEO 50 MG TABLET. 1 each 0    escitalopram oxalate (LEXAPRO) 5 MG tablet TAKE 1 TABLET (5 MG TOTAL) BY MOUTH DAILY. 90 tablet 1    famotidine (PEPCID) 20 MG tablet Take 1 tablet (20 mg total) by mouth nightly. 30 tablet 3    hydrocortisone 2.5 % cream Apply topically two (2) times a day as needed (hemorrhoids). 30 g 0    lidocaine (ASPERCREME) 4 % patch Place 2 patches on the skin daily. 60 patch 0    LORazepam (ATIVAN) 0.5 MG tablet Take 2 tablets (1 mg total) by mouth Three (3) times a day as needed for anxiety.      naloxegol (MOVANTIK) 12.5 mg tablet Take 1  tablet (12.5 mg total) by mouth daily. 30 tablet 0    naloxone (NARCAN) 4 mg nasal spray One spray in either nostril once for known/suspected opioid overdose. May repeat every 2-3 minutes in alternating nostril til EMS arrives 2 each 0    nirogacestat (OGSIVEO) 50 mg tablet Take 3 tablets (150 mg total) by mouth two (2) times a day. Swallow tablets whole; do not break, crush, or chew. 180 tablet 5    oxyCODONE (ROXICODONE) 10 mg immediate release tablet Take 1 tablet (10 mg total) by mouth every eight (8) hours as needed for pain. Fill on or after: 01/30/23 100 tablet 0    pimecrolimus (ELIDEL) 1 % cream Apply topically two (2) times a day as needed. To face as needed for rash      scopolamine (TRANSDERM-SCOP) 1 mg over 3 days Place 1 patch (1 mg total) on the skin every third day. 10 patch 2    sucralfate (CARAFATE) 100 mg/mL suspension Take 10 mL (1 g total) by mouth four (4) times a day. 1200 mL 11    triamcinolone (KENALOG) 0.1 % cream Apply topically two (2) times a day as needed. Apply to rash as needed      zinc oxide-cod liver oil (DESITIN 40%) 40 % Pste Apply topically daily as needed. 57 g 0    mirtazapine (REMERON) 15 MG tablet Take 1 tablet (15 mg total) by mouth nightly. 90 tablet 3     No current facility-administered medications for this visit.             Physical Exam:   There were no vitals taken for this visit.      BP Readings from Last 3 Encounters:   01/23/23 130/76   01/21/23 123/83   01/14/23 125/91      Wt Readings from Last 12 Encounters:   01/23/23 57.4 kg (126 lb 9.6 oz)   01/21/23 58 kg (127 lb 12.8 oz)   01/14/23 58 kg (127 lb 12.8 oz)   12/16/22 58.5 kg (129 lb)   12/16/22 58.5 kg (129 lb)   12/08/22 61.3 kg (135 lb 3.2 oz)   11/14/22 61 kg (134 lb 8 oz)   09/24/22 62.4 kg (137 lb 9.6 oz)   09/16/22 62 kg (136 lb 11.2 oz)   09/02/22 63 kg (139 lb)   08/05/22 62.1 kg (137 lb)   07/18/22 62.8 kg (138 lb 6.4 oz)      BMI: Estimated body mass index is 21.07 kg/m?? as calculated from the following:    Height as of 01/23/23: 165.1 cm (5' 5).    Weight as of 01/23/23: 57.4 kg (126 lb 9.6 oz).    BSA: Estimated body surface area is 1.62 meters squared as calculated from the following:    Height as of 01/23/23: 165.1 cm (5' 5).    Weight as of 01/23/23: 57.4 kg (126 lb 9.6 oz).    Televisit          Labs, Data & Indices:     Lab Review:   No visits with results within 1 Week(s) from this visit.   Latest known visit with results is:   Lab on 01/23/2023   Component Date Value Ref Range Status    Sodium 01/23/2023 139  135 - 145 mmol/L Final    Potassium 01/23/2023 3.4  3.4 - 4.8 mmol/L Final    Chloride 01/23/2023 105  98 - 107 mmol/L Final    CO2 01/23/2023 30.0  20.0 -  31.0 mmol/L Final    Anion Gap 01/23/2023 4 (L)  5 - 14 mmol/L Final    BUN 01/23/2023 10  9 - 23 mg/dL Final    Creatinine 16/12/9602 0.48 (L)  0.55 - 1.02 mg/dL Final    BUN/Creatinine Ratio 01/23/2023 21   Final    eGFR CKD-EPI (2021) Female 01/23/2023 >90  >=60 mL/min/1.60m2 Final    Glucose 01/23/2023 135  70 - 179 mg/dL Final    Calcium 54/11/8117 9.8  8.7 - 10.4 mg/dL Final    Albumin 14/78/2956 4.0  3.4 - 5.0 g/dL Final    Total Protein 01/23/2023 8.1  5.7 - 8.2 g/dL Final    Total Bilirubin 01/23/2023 0.3  0.3 - 1.2 mg/dL Final    AST 21/30/8657 19  <=34 U/L Final    ALT 01/23/2023 18  10 - 49 U/L Final    Alkaline Phosphatase 01/23/2023 85  46 - 116 U/L Final    Magnesium 01/23/2023 2.4  1.6 - 2.6 mg/dL Final    WBC 84/69/6295 9.5  3.6 - 11.2 10*9/L Final    RBC 01/23/2023 5.03  3.95 - 5.13 10*12/L Final    HGB 01/23/2023 13.5  11.3 - 14.9 g/dL Final    HCT 28/41/3244 40.3  34.0 - 44.0 % Final    MCV 01/23/2023 80.1  77.6 - 95.7 fL Final    MCH 01/23/2023 26.9  25.9 - 32.4 pg Final    MCHC 01/23/2023 33.6  32.0 - 36.0 g/dL Final    RDW 03/12/7251 14.1  12.2 - 15.2 % Final    MPV 01/23/2023 10.4  6.8 - 10.7 fL Final    Platelet 01/23/2023 258  150 - 450 10*9/L Final    Neutrophils % 01/23/2023 67.4  % Final    Lymphocytes % 01/23/2023 22.5  % Final    Monocytes % 01/23/2023 5.9  % Final    Eosinophils % 01/23/2023 3.8  % Final    Basophils % 01/23/2023 0.4  % Final    Absolute Neutrophils 01/23/2023 6.4  1.8 - 7.8 10*9/L Final    Absolute Lymphocytes 01/23/2023 2.1  1.1 - 3.6 10*9/L Final    Absolute Monocytes 01/23/2023 0.6  0.3 - 0.8 10*9/L Final    Absolute Eosinophils 01/23/2023 0.4  0.0 - 0.5 10*9/L Final    Absolute Basophils 01/23/2023 0.0  0.0 - 0.1 10*9/L Final    Hypochromasia 01/23/2023 Slight (A)  Not Present Final     .............................................................................................................................................             HISTORY:     Brief Disease Course:    08/2020 colectomy for [name] 8/022 ileostomy takedown, complicated by postop ileus and inability to eat during stage 1 and 2 (need for TPN)   2023-2024 significant abdominal pain without a clear objective correlate, recurrent hospitalizations, need for TPN due to inability to keep nutrition up due to abdominal pain.   06/2022 hospitalization for abdominal pain, later on improvement and stop of TPN  07/2022 and 7-12/2022  hospitalization, later one for syncope, dystonia, muscle spasms, abdominal pain, GAD    Other Paraspinal and anterior chest desmoid tumors    Colectomy specimen:  Terminal ileum, appendix and colon, total colectomy:  -Tubular adenomas (at least 100), extending from the ileocecal valve to the rectum, with the majority located in the rectum. The adenomas range from 0.2 cm to 0.7 cm in greatest dimension.  -There is no evidence of high-grade dysplasia or malignancy.  -Mucosal margins are negative for  dysplasia (5.2 cm from proximal, 0.1 cm from distal).  -The appendix and ileum are not involved.      Endoscopy:   Pouchoscopy 07/2022     Some small ulcers at suture line, treated with APC due to suspicion that these are the culprit for recurrent bleeding. Otherwise normal.       Pouchoscopy 03/2022  The examined portion of the ileum was normal.                         - Friability with contact bleeding in the ileoanal                          pouch only along suture lines, otherwise healthy                          pouch. Treated with argon beam coagulation.                         - Rectal cuff with healthy appearing mucosa seen.                         - No specimens collected.        Pouchoscopy 02/2022     Patient is status-post total colectomy with an ileal pouch-anal        anastomosis.       Anal fissures were found on anoscopy       The ileocolonic anastomosis contained a single (solitary)        circumferential ulcer, approximately 10mm in length. Oozing was present.        Biopsies were taken with a cold forceps for histology. Coagulation for        bleeding prevention using argon plasma at 0.5 liters/minute and 25 watts        was successful.       The exam was otherwise without abnormality.       A: Colon, pouch anastomosis, biopsy:  -Severely active pouchitis with ulceration, mild glandular architecture distortion and focal adenomatous change.    Pouchoscopy 01/2022:  Anal fissure found on perianal exam.                         - Stool in the ileoanal pouch and neo-terminal ileum.                         - The ileoanal pouch and neo-terminal ileum are normal.                         - A single (solitary) ulcer at the ileocolonic                          anastomosis.    Upper endoscopy 08/2021     Normal esophagus.                         - Multiple gastric polyps. Biopsied.                         - Normal stomach otherwise. Biopsied.                         -  Normal examined duodenum. Biopsied.    A: Duodenum, biopsy  - Adenomatous polyp (1 fragment)  - No high grade dysplasia or carcinoma identified  - Separate fragments of duodenal mucosa with mi ld-moderate villous blunting  - No increased intraepithelial lymphocytes identified     B: Stomach, biopsy  - Gastric fundic mucosa with chronic superficial gastritis  - Gastric antral mucosa with erosive minimally active chronic superficial gastritis and slight reactive foveolar hyperplasia  - No Helicobacter pylori identified on H&E stain  - No intestinal metaplasia or dysplasia identified     C: Stomach, polypectomy  - Polypoid fragments of gastric fundic mucosa with features suggestive of early fundic gland polyps  - No intestinal metaplasia or dysplasia identified      Pouchoscopy 08/2021   The perianal and digital rectal examinations were normal.       The j-pouch, pouch inlet and rectal cuff appeared normal.       The exam was otherwise without abnormality, including no evidence of        mass of intussusception.                                                               Pouchoscopy 01/23/2021  Impression:            - Preparation of the colon was fair.                         - The examined portion of the ileum was normal.                         - The ileoanal pouch is normal.                         - Rectal cuff with congestion and an intact staple                          line seen.                         - No specimens collected.    - Hegar dilation 17 - 20 mm        Imaging:  Multiple imaging studies reviewed in EPIC          Assessment & Recommendations:     Karron Kovacevic is a 22 y.o. female who presents for follow up for multiple GI and other issues after pouch done for [name] .   Overall the patient medical history and current as well as past problems are multifactorial ,  which often can not be  satisfactorily explained just based on the documented organic findings. Multiple teams are involved (pain management, oncology, in patient psychiatry, in patient nutrition, rehabilitation, neurology)    Pouchitis symptoms, abdominal distension: Overall stable, now recommendation for a 2 week course of cefdinir due to increased leakage.    Diarrhea exacerbated with Movantik (which helped for abdominal pain in hospital , but now probably to potent probably due to lower narcotic use). Discussion that she can use it as needed, perhaps only 6.25 mg (half 12.5 mg tablet).  Unfortunately weight loss again of 5 pounds over last week due to inability to eat.   I discussed that she has to really try top eat , since I would like to remove the Corpak latest by January. There is overall no medical objective evidence that she should be not able to eat at least a low residue diet. There are many other factors which contribute to the current situation including anxiety to eat, since it causes pain . The pain can not be explained by imaging or by endoscopic findings.       Abdominal pain: Multifactorial, exacerbated by anxiety, narcotic bowel syndrome and other factors.  On  oxycodone and buprenorphine . Encouraged patient to try to reduce further.      Anxiety/nausea : Lexapro stopped, on mirtazapine 7.5 mg , as needed Ativan. In the past discussed with Dr. [name] that follow-up with Adalberto Ill (psychologist) would be very helpful, since I think that many of the patient symptoms are worsened by GAD. Especially the dystonia is very difficult to explain.     Unfortunately she tells me today that her psychiatrist left and she is in process of finding new psychiatrist.   .   Dystonia, inability to move: Need wheelchair during hospitalization. Reason for dystonia could not be evaluated. Neurology evaluated patient seemingly ruling out an acute dystonic reaction, cord compression, central process, nutritional deficiency, stiff person syndrome and [name] after normal EMG on 8/22. Was on valium and baclofen, both stopped, today no complaints.     Desmoid therapy:Off sorafenib.     Nutrition: Input of Christina Proch and Dr. Carmon Sails highly appreciated.    Fecal incontinence: Today no complaint    Iron deficiency anemia: Currently low normal ferritin (30; 01/2023)    Portions of this record have been created using [employer]. Dictation errors have been sought, but may not have been identified and corrected.        The patient reports they are physically located in [place] and is currently: at home. I conducted a audio/video visit. I spent  20 min  on the video call with the patient. I spent an additional 15 minutes on pre- and post-visit activities on the date of service .       PLAN:      Patient Instructions   Take cefdinir 1 tablet twice daily for 2 weeks

## 2023-02-04 DIAGNOSIS — K91850 Pouchitis: Secondary | ICD-10-CM | POA: Diagnosis not present

## 2023-02-04 DIAGNOSIS — R109 Unspecified abdominal pain: Secondary | ICD-10-CM | POA: Diagnosis not present

## 2023-02-04 DIAGNOSIS — Q8789 Other specified congenital malformation syndromes, not elsewhere classified: Secondary | ICD-10-CM | POA: Diagnosis not present

## 2023-02-04 DIAGNOSIS — R1084 Generalized abdominal pain: Secondary | ICD-10-CM | POA: Diagnosis not present

## 2023-02-04 NOTE — Unmapped (Signed)
Adolescent and Young Adult Cancer Program Visit     Service date: February 03, 2023    Encounter location: Virtual: Video    Clinician: [name], LCSW - AYA Clinical Social Worker    Patient identifiers: Tameyah is a 22 y.o. with desmoid tumors and [name]. Gayann uses she/her pronouns.      Visit Summary     Met with the patient for scheduled supportive counseling.    Provided support around transition of care, past relationship challenges, ongoing health-related disruption to life goals, and anxiety.     Previously discussed transfer of psychiatric care to [facility], who requested scheduling support. Will follow up. Jacqueline and I discussed that her current anxiety medications do not appear to be adequately managing her symptoms.    Provided referral to community therapist, who Hela plans to contact.    Follow Up/Plan:     Our program will continue to be available to this patient for AYA-appropriate support on an as-needed basis. The patient has our contact information [email or web address] and has been encouraged to contact us as needed.       02/03/2023     [name], LCSW  Adolescent and Young Adult Clinical Social Worker

## 2023-02-04 NOTE — Unmapped (Signed)
SW attempted to reach the patient but unsuccessful. SW left a voice message encouraging the patient to return the writer's call .

## 2023-02-05 DIAGNOSIS — K91850 Pouchitis: Secondary | ICD-10-CM | POA: Diagnosis not present

## 2023-02-05 DIAGNOSIS — Q8789 Other specified congenital malformation syndromes, not elsewhere classified: Secondary | ICD-10-CM | POA: Diagnosis not present

## 2023-02-05 DIAGNOSIS — R109 Unspecified abdominal pain: Secondary | ICD-10-CM | POA: Diagnosis not present

## 2023-02-05 DIAGNOSIS — R1084 Generalized abdominal pain: Secondary | ICD-10-CM | POA: Diagnosis not present

## 2023-02-06 DIAGNOSIS — K91850 Pouchitis: Secondary | ICD-10-CM | POA: Diagnosis not present

## 2023-02-06 DIAGNOSIS — R1084 Generalized abdominal pain: Secondary | ICD-10-CM | POA: Diagnosis not present

## 2023-02-06 DIAGNOSIS — R109 Unspecified abdominal pain: Secondary | ICD-10-CM | POA: Diagnosis not present

## 2023-02-06 DIAGNOSIS — Q8789 Other specified congenital malformation syndromes, not elsewhere classified: Secondary | ICD-10-CM | POA: Diagnosis not present

## 2023-02-07 DIAGNOSIS — R109 Unspecified abdominal pain: Secondary | ICD-10-CM | POA: Diagnosis not present

## 2023-02-07 DIAGNOSIS — Q8789 Other specified congenital malformation syndromes, not elsewhere classified: Secondary | ICD-10-CM | POA: Diagnosis not present

## 2023-02-07 DIAGNOSIS — K91850 Pouchitis: Secondary | ICD-10-CM | POA: Diagnosis not present

## 2023-02-07 DIAGNOSIS — R1084 Generalized abdominal pain: Secondary | ICD-10-CM | POA: Diagnosis not present

## 2023-02-08 DIAGNOSIS — R109 Unspecified abdominal pain: Secondary | ICD-10-CM | POA: Diagnosis not present

## 2023-02-08 DIAGNOSIS — R1084 Generalized abdominal pain: Secondary | ICD-10-CM | POA: Diagnosis not present

## 2023-02-08 DIAGNOSIS — Q8789 Other specified congenital malformation syndromes, not elsewhere classified: Secondary | ICD-10-CM | POA: Diagnosis not present

## 2023-02-08 DIAGNOSIS — K91850 Pouchitis: Secondary | ICD-10-CM | POA: Diagnosis not present

## 2023-02-09 ENCOUNTER — Telehealth
Admit: 2023-02-09 | Discharge: 2023-02-10 | Payer: BLUE CROSS/BLUE SHIELD | Attending: Registered" | Primary: Registered"

## 2023-02-09 DIAGNOSIS — R1084 Generalized abdominal pain: Secondary | ICD-10-CM | POA: Diagnosis not present

## 2023-02-09 DIAGNOSIS — K91850 Pouchitis: Secondary | ICD-10-CM | POA: Diagnosis not present

## 2023-02-09 DIAGNOSIS — Q8789 Other specified congenital malformation syndromes, not elsewhere classified: Secondary | ICD-10-CM | POA: Diagnosis not present

## 2023-02-09 DIAGNOSIS — R109 Unspecified abdominal pain: Secondary | ICD-10-CM | POA: Diagnosis not present

## 2023-02-09 NOTE — Unmapped (Signed)
[facility] Outpatient Nutrition Services   Medical Nutrition Therapy Consultation       Visit Type:    Return Assessment    Referral Reason:   [name]         [name] [name] is a 22 y.o. female seen for medical nutrition therapy. Her active problem list, medication list, allergies, and lab results were reviewed.      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     Anthropometrics   Height: 165.1 cm (5' 5)   Weight: 57.4 kg (126 lb 9.6 oz) (last documented weight 01/23/23); on 02/09/23, patient reports that she was 125 lb at last check at doctor's office last week   Body mass index is 21.07 kg/m??.    Wt Readings from Last 5 Encounters:   01/23/23 57.4 kg (126 lb 9.6 oz)   01/21/23 58 kg (127 lb 12.8 oz)   01/14/23 58 kg (127 lb 12.8 oz)   12/16/22 58.5 kg (129 lb)   12/16/22 58.5 kg (129 lb)        Usual body weight: patient states that she was around 140 lb when on TPN, but that she was told that it was probably fluid weight (states she had pitting edema); reports that she was about 133 lb when she left the hospital and that she is about 127 lb now (states that she does not observe any swelling now); states that she likes to stay around 120 lb range in general     Ideal Body Weight: 56.75 kg  Weight in (lb) to have BMI = 25: 149.9    Nutrition Risk Screening:   Food Insecurity: No Food Insecurity (09/26/2022)    Hunger Vital Sign     Worried About Running Out of Food in the Last Year: Never true     Ran Out of Food in the Last Year: Never true        Nutrition Focused Physical Exam:    Nutrition Evaluation  Overall Impressions: Unable to perform Nutrition-Focused Physical Exam at this time due to (comment) (video visit) (02/12/23 0851)  Nutrition Designation: Normal weight (BMI 18.50 - 24.99 kg/m2) (02/12/23 0851)     Malnutrition Screening:   Unable to complete Malnutrition Assessment at this time due to (comment) (NFPE limited by virtual visit) (02/12/23 1610)    Biochemical Data, Medical Tests and Procedures:  All pertinent labs and imaging reviewed by Helen Hashimoto, RD/LDN at 4:52 PM 02/12/2023.    01/20/22- upper endoscopy  Impression:            - No evidence of blood or source of hematochezia seen                          on upper endoscopy- Normal esophagus.                         - Multiple gastric polyps. Polyps have previously been                          biopsied and consistent with adenomas                         -  Normal examined duodenum.                         - Normal examined jejunum.                         - No specimens collected.     08/05/22- pouch exam  Impression:            - The examined portion of the ileum was normal.                         - The ileoanal pouch is overall normal.                         - Rectal cuff with healthy appearing mucosa seen.                          Treated with argon beam coagulation.     11/11/22- CT pelvis w contrast  Impression:   Sequelae of proctocolectomy with ileoanal anastomosis with redemonstrated moderate dilatation of the proximal aspect of the ileoanal anastomosis, which appears less prominent compared to the prior study.       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       Redemonstrated ill-defined soft tissue mass within the lower central mesentery with associated tethering to adjacent small bowel loops, compatible with known desmoid tumor.       Distended urinary bladder, which is otherwise unremarkable in appearance. Correlate clinically for signs of urinary retention. 11/24/22- SLP documentation:   Recommendations: PO Diet, Consider alternative nutrition  Diet Liquids Recommendations: Thin Liquids, Level 0, No Restrictions  Diet Solids Recommendation: Regular Consistency Solids  Recommended Form of Medications: Whole, With puree (per pt preference)  Recommended Compensatory Techniques : Slow rate, Small sips/bites, Upright 90 degrees, HOB>30 degrees at all times, Monitor signs of aspiration, Alternate liquids and solids, Feed only when alert/awake, Endurance may be limited: give small meals and snacks, Upright 30 min after meal  Risk for Aspiration: Mild     12/12/22- DG Naso G Tube Plc W/FI W/Rad  IMPRESSION:   Successful repositioning of nasoenteric tube to the duodenum.   Okay for postpyloric use.      10/19/22- zinc 55 mcg/dl, copper WNL, vitamin E WNL, 25OH vitamin D WNL, folate WNL, vitamin B6 WNL, niacin WNL, vitamin B2 WNL, vitamin B1 298 nmol/L (high)  12/04/22- ferritin WNL, iron 42 micrograms/dl, TIBC 161.0 micrograms/dl, transferrin 960 mg/dl  4/54/09- phosphorous WNL  12/16/22- magnesium WNL, sodium WNL, potassium WNL, eGFR WNL, BG WNL     01/14/23- magnesium WNL, 25OH vitamin D, iron 23 micrograms/dl, iron saturation 8%, ferritin WNL, CRP 16 mg/L, sodium WNL, potassium WNL, creatinine 0.46 mg/dl, BUN WNL, eGFR WNL, BG WNL, calcium 10.6 mg/dl    81/19/14- magnesium WNL, sodium WNL, potassium WNL, eGFR WNL     Medications and Vitamin/Mineral Supplementation:   All nutritionally pertinent medications reviewed on 02/12/2023.     Nutritionally pertinent medications include: cholecalciferol, Ogsiveo, Pepcid, Remeron, Movantik, oxycodone, scopolamine, Carafate     She is not currently taking nutrition supplements. Patient states that she is supposed to be taking a multivitamin but didn't take it the first week she was home and forgot to start it after that. Notes that  she is not currently taking vitamin D listed in chart. 01/14/23 update: patient reports that she is not taking vitamins, as multivitamin makes her feel sick when she tries to swallow it. 02/09/23 update: patient reports that she hasn't taken a multivitamin yet and forgot about it. Thinks her mom may have bought some chewable ones, but not sure.      Current Outpatient Medications   Medication Sig Dispense Refill    acetaminophen (TYLENOL) 500 MG tablet Take 2 tablets (1,000 mg total) by mouth every eight (8) hours.      buprenorphine-naloxone (SUBOXONE) 2-0.5 mg sublingual film Place 0.75 Film (1.5 mg of buprenorphine total) under the tongue three (3) times a day. Fill on or after: 12/26/22 68 Film 0    cholecalciferol, vitamin D3 25 mcg, 1,000 units,, 1,000 unit (25 mcg) tablet Take 1 tablet (25 mcg total) by mouth daily. 100 tablet 3    COURIERED MED OR SUPPLY OGSVEO 50 MG TABLET. 1 each 0    escitalopram oxalate (LEXAPRO) 5 MG tablet TAKE 1 TABLET (5 MG TOTAL) BY MOUTH DAILY. 90 tablet 1    famotidine (PEPCID) 20 MG tablet Take 1 tablet (20 mg total) by mouth nightly. 30 tablet 3    hydrocortisone 2.5 % cream Apply topically two (2) times a day as needed (hemorrhoids). 30 g 0    lidocaine (ASPERCREME) 4 % patch Place 2 patches on the skin daily. 60 patch 0    LORazepam (ATIVAN) 0.5 MG tablet Take 2 tablets (1 mg total) by mouth Three (3) times a day as needed for anxiety.      mirtazapine (REMERON) 15 MG tablet Take 1 tablet (15 mg total) by mouth nightly. 90 tablet 3    naloxegol (MOVANTIK) 12.5 mg tablet Take 1 tablet (12.5 mg total) by mouth daily. 30 tablet 0    naloxone (NARCAN) 4 mg nasal spray One spray in either nostril once for known/suspected opioid overdose. May repeat every 2-3 minutes in alternating nostril til EMS arrives 2 each 0    nirogacestat (OGSIVEO) 50 mg tablet Take 3 tablets (150 mg total) by mouth two (2) times a day. Swallow tablets whole; do not break, crush, or chew. 180 tablet 5    oxyCODONE (ROXICODONE) 10 mg immediate release tablet Take 1 tablet (10 mg total) by mouth every eight (8) hours as needed for pain. Fill on or after: 01/30/23 100 tablet 0    pimecrolimus (ELIDEL) 1 % cream Apply topically two (2) times a day as needed. To face as needed for rash      scopolamine (TRANSDERM-SCOP) 1 mg over 3 days Place 1 patch (1 mg total) on the skin every third day. 10 patch 2    sucralfate (CARAFATE) 100 mg/mL suspension Take 10 mL (1 g total) by mouth four (4) times a day. 1200 mL 11    triamcinolone (KENALOG) 0.1 % cream Apply topically two (2) times a day as needed. Apply to rash as needed      zinc oxide-cod liver oil (DESITIN 40%) 40 % Pste Apply topically daily as needed. 57 g 0     No current facility-administered medications for this visit.       Nutrition History:     Dietary Restrictions: patient states that she avoided corn, popcorn, nuts and generally limits high fiber foods, as she was told to avoid these when she had temporary ileostomy and never heard different for J pouch. Patient notes that every time she eats chicken it  makes her feel nauseated and sends her GI system into pain. States that she avoids bacon, because it makes her whole GI system itch. States that lettuce gives her GI bleeds, so avoids salads and will only eat lettuce in small amounts.       Gastrointestinal Issues: on 02/09/23, patient reports that she was having about 12-13 BM per day prior to her trip to [place], however, more like 7 BM max in a day during her trip. States that she tried Horticulturist, commercial and it made her really sick and ruined her whole day afterward.  Patient also notes that she got her wisdom teeth removed recently.     Hunger and Satiety: on 02/09/23, patient reports that she was able to eat a bit more on her recent trip to [place] and isn't sure if maybe being in a different environment may have been helpful. Was even able to eat most of a kid's meal at a restaurant, which is not usual for her of late. States that she got home last night.     Food Safety and Access: She did not report issues.     Diet Recall:   Time Intake- yesterday 02/08/23 (+ infused 155 ml EN formula this day)   Breakfast 8 oz 2% Lactaid milk   Snack (AM)    Lunch States she took bites off of family member's plates: about 1/2 cup or a bit more of linguine alfredo with bites of chicken and mushrooms mixed in, 4 sweet potato fries, 1 pork rib, small slice of bread, 16 oz sweet tea    Snack (PM) 1/4 big can of Pringles, 1.5-2 donut holes, 8 oz sweet tea   Dinner Sliver of chicken, 8 oz sweet tea    Snack (HS) 4-6 oz 2% Lactaid milk     Food-Related History:    Additional 02/09/23 PO Intake Updates:   Patient reports that she has been keeping track of PO intakes in notes app of her phone in recent days (not exact measurements, but tried my best). Opts to read aloud to RD, rather than sending screen shot in MyChart, with report as below (plus 24 hour recall above):     02/05/23  Breakfast: 8 oz 2% Lactaid milk  Lunch: 1.5 chicken fingers, handful (about 3-4) fries, 8-10 oz sweet tea    Dinner: 6 spoons of mac and cheese, 4 bites of [place], 1.5 small corn fritters with butter  HS Snack: 8 oz 2% Lactaid milk  Tube Feeds: 2 cartons infused     02/06/23  Breakfast: about 3/4 of a Fairlife shake that contains 26 g protein   Lunch: 1/2 [place], cheese, lettuce, mayo sub (from [employer], kid's size), 10-16 oz sweet tea   Dinner: 3/4 piece of baked salmon, 10 sweet potato waffle fries, 8 oz sweet tea   HS Snack: 6-8 oz 2% Lactaid milk    Tube Feeds: none this day (wasn't able to per patient)     02/07/23  Breakfast: 4 oz 2% Lactaid milk   Lunch: half country fried chicken cutlet with gravy (pretty big), little corn fritter, a few bites of mac and cheese   Dinner: 1/4 cheeseburger, handful of fries  HS Snack: 8 oz 2% Lactaid milk   Tube Feeds: 1 carton infused    Estimated calorie and protein content of reported PO intakes (excluding EN) from 02/05/23-02/07/23:     02/05/23: ~960 kcal, 52 g protein  02/06/23: ~1174 kcal, 78 g protein  02/07/23: ~880 kcal, 47 g  protein      Average calorie/protein intake (from PO only) from 11/28-11/30/24:   ~1005 kcal per day (~57-69% estimated kcal needs)   ~59 grams protein (67-84% estimated protein needs)     EN REGIMEN (as per 02/09/23 report)  Enteral Access: ND  Formula: Osmolite 1.5  Volume: variable depending on PO intake, as above   Rate: usually 60-75 ml/hr per patient, but occasionally increases to 100-125 ml/hr if in a hurry (makes her feel sick)   Flushes: may do flushes around 80 ml now all day long (start, stop, and during feeds) (continues to flush when not using tube)     Osmolite 1.5 provides (per container): 237 ml formula, 355 kcal, 30-45 grams protein, 14.9 grams carbohydrate, 0 grams fiber, 181 ml free water from formula.      Per product website, 1000 mL formula provides at least 100% of the RDIs for 25 essential vitamins and minerals (patient taking in less than this volume).       Eating Behaviors:  Not addressed this visit.     Physical Activity:  Not addressed this visit.     Daily Estimated Nutritional Needs:  Energy: ~0630-1601 kcals [25-30 kcal/kg using last recorded weight, 58.5 kg (01/01/23 1530)]  Protein: ~70-88 gm [1.2-1.5 gm/kg using last recorded weight, 58.5 kg (01/01/23 1530)]  Carbohydrate:   [no restriction]  Fluid:   [per MD team]    Nutrition Goals & Evaluation      Patient will meet estimated nutritional needs via PO intakes, with goal of discontinuing EN per discussions with GI team  (Progressing)  Patient will prevent unintentional loss of dry weight  (Ongoing)  Patient will implement low fiber nutrition therapy principles as tolerated  (Progressing)  Patient will implement high calorie/high protein PO nutrition strategies as tolerated  (Ongoing)  Patient will keep record of PO intake to better quantify progress  (Progressing)     Nutrition goals reviewed, relevant barriers identified and addressed: none evident. She is evaluated to have good to fair willingness and ability to achieve nutrition goals.       Nutrition Assessment       Patient seen via video on 02/09/23 for nutrition follow-up. Evaluation of malnutrition/volume status limited by video format. Patient reports that weight trending down slightly since last visit. She notes that Banatrol unsuccessful and RD encouraged incorporation of soluble fiber sources in diet as tolerated. Based on estimate above, patient meeting about 57-69% estimated kcal needs and 67-84% estimated protein needs on average over 3 days while in [place] (now back home per patient) and practicing reducing volume of EN infused. She states that she wants to be free of feeding tube, but is apprehensive about weight trend and prospect of returning to TPN. Encouraged patient to implement strategies for increasing calorie intake of meals/snacks as previously discussed and addressed questions. Encouraged her to reach out as needed between visits for additional support.      Nutrition Intervention      - Nutrition Counseling: Motivational Interviewing Goal setting Problem solving  - Care Coordination: Collaboration with other provider(s): Dr. Carmon Sails and Dr. Phineas Semen on close of encounter    Nutrition Plan:   Continue to work on reducing daily volume of tube feeds as tolerated  Osmolite 1.5 provides per container: 237 ml formula, 355 kcal, 30-45 grams protein, 14.9 grams carbohydrate, 0 grams fiber, 181 ml free water from formula.   Minimum flushes 30 ml q4h to maintain tube patency, with additional flushes as required  to maintain hydration  Call number provided to you by Jobe Gibbon if you require refills of enteral supplies sooner than is scheduled   Aim to increase oral intakes as tolerated. Review handouts on low fiber diet, tips for increasing calories, and tips for adding protein sent in MyChart first visit and implement suggestions per individual tolerance. Generally would aim for small, frequent intakes throughout the day. Consider setting an alarm or timer if this helps remind you to eat at more routine intervals. High calorie/high protein liquids are likely to be an efficient way to add nutrition between meals.   Consider eating food sources of soluble fiber (e.g. apples, barley, beans, carrots, citrus, oats, peas) as tolerated in small amounts to see if this helps reduce wateriness of stool, given Banatrol unsuccessful.   If the multivitamin you have is not well tolerated, consider chewable vitamin (e.g. Flintstones) as tolerated instead. Patent likely not currently meeting vitamin/mineral needs through tube feeds/PO intake, therefore this was emphasized during visit.     Follow up will occur on February 26, 2023 via video.       Food/Nutrition-related history, Anthropometric measurements, Biochemical data, medical tests, procedures, Nutrition-focused physical findings, Patient understanding or compliance with intervention and recommendations , and Effectiveness of nutrition interventions will be assessed at time of follow-up.       Recommendations for Care Team :  Consider replenishing enteral orders as needed prior to discontinuation of EN. Per nurse representative from [facility], updated orders can be placed in Epic.      Patient referred to outpatient nutrition services as part of therapy/treatment plans.      Time spent 45 minutes     The patient reports they are physically located in [place] and is currently: at home. I conducted a audio/video visit. I spent  70m 28s on the video call with the patient. I spent an additional 90 minutes on pre- and post-visit activities on the date of service .         I am located on-site and the patient is located off-site for this visit.

## 2023-02-10 ENCOUNTER — Telehealth
Admit: 2023-02-10 | Discharge: 2023-02-11 | Payer: BLUE CROSS/BLUE SHIELD | Attending: Gastroenterology | Primary: Gastroenterology

## 2023-02-10 DIAGNOSIS — R109 Unspecified abdominal pain: Principal | ICD-10-CM

## 2023-02-10 DIAGNOSIS — F419 Anxiety disorder, unspecified: Principal | ICD-10-CM

## 2023-02-10 DIAGNOSIS — R197 Diarrhea, unspecified: Principal | ICD-10-CM

## 2023-02-10 DIAGNOSIS — D509 Iron deficiency anemia, unspecified: Principal | ICD-10-CM

## 2023-02-10 DIAGNOSIS — D1391 FAP (familial adenomatous polyposis): Principal | ICD-10-CM

## 2023-02-10 DIAGNOSIS — D485 Neoplasm of uncertain behavior of skin: Principal | ICD-10-CM

## 2023-02-10 DIAGNOSIS — G8929 Other chronic pain: Principal | ICD-10-CM

## 2023-02-10 DIAGNOSIS — Z789 Other specified health status: Principal | ICD-10-CM

## 2023-02-10 DIAGNOSIS — K91850 Pouchitis: Principal | ICD-10-CM

## 2023-02-10 DIAGNOSIS — R1084 Generalized abdominal pain: Secondary | ICD-10-CM | POA: Diagnosis not present

## 2023-02-10 DIAGNOSIS — Q8789 Other specified congenital malformation syndromes, not elsewhere classified: Secondary | ICD-10-CM | POA: Diagnosis not present

## 2023-02-10 NOTE — Unmapped (Signed)
Take cefdinir 1 tablet twice daily for 2 weeks

## 2023-02-11 DIAGNOSIS — R1084 Generalized abdominal pain: Secondary | ICD-10-CM | POA: Diagnosis not present

## 2023-02-11 DIAGNOSIS — R109 Unspecified abdominal pain: Secondary | ICD-10-CM | POA: Diagnosis not present

## 2023-02-11 DIAGNOSIS — K91850 Pouchitis: Secondary | ICD-10-CM | POA: Diagnosis not present

## 2023-02-11 DIAGNOSIS — M6281 Muscle weakness (generalized): Secondary | ICD-10-CM | POA: Diagnosis not present

## 2023-02-11 DIAGNOSIS — Q8789 Other specified congenital malformation syndromes, not elsewhere classified: Secondary | ICD-10-CM | POA: Diagnosis not present

## 2023-02-11 NOTE — Unmapped (Signed)
[facility]  Psychiatry--Comprehensive Cancer Support Program   Established Patient E&M Service - Outpatient       Assessment:    [name] [name] presents for follow-up evaluation. She states that she continues to struggle with mood symptoms and anxiety consistent with major depressive disorder and generalized anxiety disorder. Her panic attacks are infrequent and less severe now that she is home from the hospital. Ongoing sleep difficulties reported. She endorses symptoms consistent with post-traumatic stress disorder, but further evaluation is needed. She was prescribed escitalopram 5 mg by Dr. [name] back in October, but she never started this medication. She explained that she has experienced medical trauma around medications leaving her fearful of medication trials (very sensitive to medication changes). She states that she will use lorazepam 0.5 mg PRN, but is using this medication sparingly. Education provided re: escitalopram and how the goal of this therapy would be to stabilize her mood and decrease anxiety. Encouraged her to consider using the escitalopram, but I also explained to her that it is her decision and that we can also explore non-pharmacologic methods of managing her symptoms. Will work to establish rapport, further assess her needs, provide medication management, and provide supportive psychotherapy. Next appointment planned for 02/26/23.    Identifying Information:  [name] [name] is a 22 y.o. 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 major depressive disorder and generalized anxiety disorder.     Risk Assessment:  A suicide and violence risk assessment was performed as part of this evaluation. There patient is deemed to be at chronic elevated risk for self-harm/suicide given the following factors: current diagnosis of depression, hopelessness, and chronic severe medical condition. The patient is deemed to be at chronic elevated risk for violence given the following factors: N/A. These risk factors are mitigated by the following factors:lack of active SI/HI, no know access to weapons or firearms, no history of previous suicide attempts , no history of violence, supportive family, sense of responsibility to family and social supports, presence of an available support system, expresses purpose for living, and safe housing. There is no acute risk for suicide or violence at this time. The patient was educated about relevant modifiable risk factors including following recommendations for treatment of psychiatric illness and abstaining from substance abuse.    While future psychiatric events cannot be accurately predicted, the patient does not currently require acute inpatient psychiatric care and does not currently meet [facility] involuntary commitment criteria.      Plan:    Problem 1: Major Depressive Disorder  Status of problem: new problem to this provider  Interventions:   Continue mirtazapine 7.5 mg at bedtime. Patient states that prior dose escalations were not tolerable, so will stay at 7.5 mg.   Start escitalopram 5 mg daily. This was ordered in October by Dr. [name], but she has not started this medication. Significant fear over medication trials reported. Encouraged her to reconsider taking this medication given the severity of her mood and anxiety and she did agree to consider use of this medication further. I also told her that this ultimately her decision.  Supportive psychotherapy. Patient's social worker through the [facility] program recently resigned from [facility] and patient has had a hard time with this. Will plan to explore therapy options further  with patient, but will provide supportive psychotherapy as needed.   Prior psychiatrist documented that patient would benefit from DBT skills group. I did not have the opportunity to explore this with her today. Will evaluate at a later date.     Problem 2: Generalized Anxiety Disorder  Status of problem: new problem to this provider  Interventions:   See MDD plan above  Continue lorazepam 0.5 mg PRN    Problem 3: Insomnia  Status of problem:  new problem to this provider  Interventions:   Will plan to provide sleep hygiene education  See mirtazapine plan above. Patient struggling with sleep despite taking 7.5 mg nightly. Will plan to explore alternatives.      Psychotherapy provided:  No billable psychotherapy service provided.    Patient has been given this writer's contact information as well as the [facility] Psychiatry urgent line number. The patient has been instructed to call 911 for emergencies.      Subjective:    Chief complaint:  Follow-up psychiatric evaluation for anxiety and depression    Interval History:   Patient had been very close with her social worker through the [facility] program and has struggled since learning of her resignation from [facility] last month. She also reports a trusting relationship with her prior psychiatrist who is no longer at [facility]. Today she is establishing care with me.     Today she is endorsing anhedonia, moderate depressed mood, decreased motivation, feelings of hopelessness, feelings of worthlessness, insomnia (difficulty falling asleep and difficulty staying asleep), moderate to severe anxiety, ruminations, difficulty concentrating, fatigue, muscle tension, racing thoughts at night, panic attacks (now infrequent), and trauma symptoms (upsetting thoughts about the trauma, feeling emotionally upset when reminded of the trauma, avoidance, feeling distant or cut off from others, and feeling as if future hopes or plans will not come true). She also exhibits some compulsive behaviors around checking outlets and counting remaining pills in her pill bottles after taking medications, but she has not been found to meet clinical criteria for OCD. She denies suicidal ideation, mania, psychosis, or alcohol/illicit drug use. Her symptoms remain consistent with major depressive disorder and generalized anxiety disorder and she may have PTSD (prior medical trauma), but further evaluation is needed.    She was prescribed escitalopram 5 mg back in October by Dr. [name], but she never started this medication. Significant fear re: medication trials due to sensitivity to medications. She is taking mirtazapine 7.5 mg nightly and states that she could not tolerate dose escalations in the past.     Significant coping difficulties with her severe medical issues and the impact that her illness has had on her life as a young adult. 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 It's also hard to date when you're sick all of the time. She states that she finds it difficult to advocate for herself. Patient states that she is very happy to be home after spending much of this past year in the hospital. She continues to live at home with her parents and her service dog, Layla.     Prior Psychiatric Medication Trials: amitriptyline (could not tolerate), diazepam, duloxetine (could not tolerate), hydroxyzine (urinary retention), olanzapine (urinary retention)    Psychiatric Review of Systems  Depressive Symptoms: anhedonia, depressed mood, difficulty concentrating, fatigue, feelings of worthlessness/guilt, hopelessness, insomnia, and decreased motivation  Manic Symptoms: N/A  Psychosis Symptoms: N/A  Anxiety Symptoms: excessive anxiety and worry, difficulty controlling worry, easily fatigued, difficulty concentrating,  muscle tension, difficulty falling asleep, difficulty staying asleep   Obsession/Compulsions: checks outlets at times and double checks the number of pills in her bottles after taking the medications  Panic Symptoms: recurrent, unexpected panic attacks, accelerated heart rate, numbness/tingling, I can't think rationally  Trauma Symptoms: upsetting thoughts/images, feeling emotional when reminded of the trauma, avoidance, feeling distant or cut off from people around her, insomnia, difficulty concentrating, feeling as if her future hopes or plans will not come true  Sleep disturbance: frequent night time awakening and difficulty falling asleep       Objective:      Mental Status Exam:  Appearance:    Appears stated age, Well nourished, and Clean/Neat   Motor:   No abnormal movements   Speech/Language:    Normal rate, volume, tone, fluency   Mood:    OK   Affect:   Calm, Cooperative, and Euthymic   Thought process and Associations:   Logical, linear, clear, coherent, goal directed   Abnormal/psychotic thought content:     Denies SI, HI, self harm, delusions, obsessions, paranoid ideation, or ideas of reference   Perceptual disturbances:    Denies auditory and visual hallucinations, behavior not concerning for response to internal stimuli       Other:   Insight and judgment intact     I personally spent 115 minutes face-to-face and non-face-to-face in the care of this patient, which includes all pre, intra, and post visit time on the date of service.    Visit was completed by video (or phone) and the appropriate disclaimer has been included below.    The patient reports they are physically located in [place] and is currently: at home. I conducted a audio/video visit. I spent 75 minutes on the video call with the patient. I spent an additional 40 minutes on pre- and post-visit activities on the date of service .        Sheran Lawless, PMHNP  02/11/2023

## 2023-02-12 ENCOUNTER — Telehealth: Admit: 2023-02-12 | Discharge: 2023-02-13 | Payer: BLUE CROSS/BLUE SHIELD

## 2023-02-12 DIAGNOSIS — F411 Generalized anxiety disorder: Principal | ICD-10-CM

## 2023-02-12 DIAGNOSIS — F410 Panic disorder [episodic paroxysmal anxiety] without agoraphobia: Principal | ICD-10-CM

## 2023-02-12 DIAGNOSIS — F331 Major depressive disorder, recurrent, moderate: Principal | ICD-10-CM

## 2023-02-12 DIAGNOSIS — D48119 Desmoid tumor: Principal | ICD-10-CM

## 2023-02-12 DIAGNOSIS — K91850 Pouchitis: Secondary | ICD-10-CM | POA: Diagnosis not present

## 2023-02-12 DIAGNOSIS — G4700 Insomnia, unspecified: Secondary | ICD-10-CM | POA: Diagnosis not present

## 2023-02-12 DIAGNOSIS — Q8789 Other specified congenital malformation syndromes, not elsewhere classified: Secondary | ICD-10-CM | POA: Diagnosis not present

## 2023-02-12 DIAGNOSIS — R109 Unspecified abdominal pain: Secondary | ICD-10-CM | POA: Diagnosis not present

## 2023-02-12 DIAGNOSIS — R1084 Generalized abdominal pain: Secondary | ICD-10-CM | POA: Diagnosis not present

## 2023-02-12 NOTE — Unmapped (Signed)
[facility] and Home Delivery Pharmacy Refill Coordination Note    [name] [name], DOB: 04/09/2000  Phone: [phone] (home) [phone] (work)      All above HIPAA information was verified with patient.         02/11/2023     1:32 PM   Specialty Rx Medication Refill Questionnaire   Which Medications would you like refilled and shipped? OGSIVEO 50mg  tablet   Please list all current allergies: on file in [facility] My Chart   Have you missed any doses in the last 30 days? Yes   If Yes, how many doses have you missed ? 6-7   Have you had any changes to your medication(s) since your last refill? Yes   Please list your medication(s) changes below. added Cefdinir 300mg  BID for 2 weeks   How many days remaining of each medication do you have at home? 14 days of Ogsiveo left   Have you experienced any side effects in the last 30 days? Yes   Please list the medication side effects below. (A pharmacist will reach out to you shortly to discuss your side effects. Note: your medications will not be shipped until we reach out to you). nausea, diarrhea, hair loss, rash on ears, headaches, hot flashes   Please enter the full address (street address, city, state, zip code) where you would like your medication(s) to be delivered to. [address] [place], [place] [address]   Please specify on which day you would like your medication(s) to arrive. Note: if you need your medication(s) within 3 days, please call the pharmacy to schedule your order at [phone]  02/20/2023   Has your insurance changed since your last refill? No   Would you like a pharmacist to call you to discuss your medication(s)? No   Do you require a signature for your package? (Note: if we are billing Medicare Part B or your order contains a controlled substance, we will require a signature) No         Completed refill call assessment today to schedule patient's medication shipment from the [facility] Special[facility] Delivery Pharm[facility] [phone]).  All relevant notes have been reviewed.       Confirmed patient received a Conservation officer, historic buildings and a Surveyor, mining with first shipment. The patient will receive a drug information handout for each medication shipped and additional FDA Medication Guides as required.         REFERRAL TO PHARMACIST     Referral to the pharmacist: Not needed      [facility]     Shipping address confirmed in Epic.     Delivery Scheduled: Yes, Expected medication delivery date: 02/20/23.     Medication will be delivered via UPS to the prescription address in Epic [place].    Ange Puskas M Elisabeth Cara   [facility] Home Delivery Pharmacy Specialty Technician

## 2023-02-13 DIAGNOSIS — D48119 Desmoid tumor: Principal | ICD-10-CM

## 2023-02-13 DIAGNOSIS — K91850 Pouchitis: Secondary | ICD-10-CM | POA: Diagnosis not present

## 2023-02-13 DIAGNOSIS — Q8789 Other specified congenital malformation syndromes, not elsewhere classified: Secondary | ICD-10-CM | POA: Diagnosis not present

## 2023-02-13 DIAGNOSIS — R1084 Generalized abdominal pain: Secondary | ICD-10-CM | POA: Diagnosis not present

## 2023-02-13 DIAGNOSIS — R109 Unspecified abdominal pain: Secondary | ICD-10-CM | POA: Diagnosis not present

## 2023-02-13 NOTE — Unmapped (Signed)
 Comprehensive Cancer Support Program (CCSP) - Psychiatry Outpatient Clinic   After Visit Summary    It was a pleasure to see you today in the [facility]???s Comprehensive Cancer Support Program (CCSP). The CCSP is a multidisciplinary program dedicated to helping patients, caregivers, and families with cancer treatment, recovery and survivorship.      To schedule, cancel, or change your appointment:  Please call the [facility] schedulers at [phone], Monday through Friday 8AM - 5PM.  Someone will return your call within 24 hours.      If you have a question about your medicines or you need to contact your provider:  First, try sending a My Chart message to Maryagnes Amos, PMHNP. If you are unable to do so, please call the CCSP program coordinator, [facility], at [phone].     For after hours urgent issues, you may call [phone] or call the I need to talk line at [phone]-TALK ([phone]) anytime 24/7.    CCSP Patient and Family Resource Center: [phone].    CCSP Website:  [email or web address]    For prescription refills, please allow at least 24 hours (during business hours, M-F) for providers to call in refills to your pharmacy. We are generally unable to accommodate same-day requests for refills.     If you are taking any controlled substances (such as anxiety or sleep medications), you must use them as the directions say to use them. We generally do not provide early refills over the phone without clear reason, and it would be inappropriate to obtain the medications from other doctors. We routinely use the [place] controlled substance database to monitor prescription drug use.

## 2023-02-13 NOTE — Unmapped (Signed)
Nutrition Plan:   Continue to work on reducing daily volume of tube feeds as tolerated  Osmolite 1.5 provides per container: 237 ml formula, 355 kcal, 30-45 grams protein, 14.9 grams carbohydrate, 0 grams fiber, 181 ml free water from formula.   Minimum flushes 30 ml q4h to maintain tube patency, with additional flushes as required to maintain hydration  Call number provided to you by Jobe Gibbon if you require refills of enteral supplies sooner than is scheduled   Aim to increase oral intakes as tolerated. Review handouts on low fiber diet, tips for increasing calories, and tips for adding protein sent in MyChart first visit and implement suggestions per individual tolerance. Generally would aim for small, frequent intakes throughout the day. Consider setting an alarm or timer if this helps remind you to eat at more routine intervals. High calorie/high protein liquids are likely to be an efficient way to add nutrition between meals.   Consider eating food sources of soluble fiber (e.g. apples, barley, beans, carrots, citrus, oats, peas) as tolerated in small amounts to see if this helps reduce wateriness of stool, given Banatrol unsuccessful.   If the multivitamin you have is not well tolerated, consider chewable vitamin (e.g. Flintstones) as tolerated instead. Patent likely not currently meeting vitamin/mineral needs through tube feeds/PO intake, therefore this was emphasized during visit.

## 2023-02-14 DIAGNOSIS — R1084 Generalized abdominal pain: Secondary | ICD-10-CM | POA: Diagnosis not present

## 2023-02-14 DIAGNOSIS — Q8789 Other specified congenital malformation syndromes, not elsewhere classified: Secondary | ICD-10-CM | POA: Diagnosis not present

## 2023-02-14 DIAGNOSIS — K91850 Pouchitis: Secondary | ICD-10-CM | POA: Diagnosis not present

## 2023-02-14 DIAGNOSIS — R109 Unspecified abdominal pain: Secondary | ICD-10-CM | POA: Diagnosis not present

## 2023-02-14 NOTE — Unmapped (Signed)
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

## 2023-02-15 DIAGNOSIS — R109 Unspecified abdominal pain: Secondary | ICD-10-CM | POA: Diagnosis not present

## 2023-02-15 DIAGNOSIS — Q8789 Other specified congenital malformation syndromes, not elsewhere classified: Secondary | ICD-10-CM | POA: Diagnosis not present

## 2023-02-15 DIAGNOSIS — R1084 Generalized abdominal pain: Secondary | ICD-10-CM | POA: Diagnosis not present

## 2023-02-15 DIAGNOSIS — K91850 Pouchitis: Secondary | ICD-10-CM | POA: Diagnosis not present

## 2023-02-16 DIAGNOSIS — R1084 Generalized abdominal pain: Secondary | ICD-10-CM | POA: Diagnosis not present

## 2023-02-16 DIAGNOSIS — Z3162 Encounter for fertility preservation counseling: Secondary | ICD-10-CM | POA: Diagnosis not present

## 2023-02-16 DIAGNOSIS — R109 Unspecified abdominal pain: Secondary | ICD-10-CM | POA: Diagnosis not present

## 2023-02-16 DIAGNOSIS — K91850 Pouchitis: Secondary | ICD-10-CM | POA: Diagnosis not present

## 2023-02-16 DIAGNOSIS — Q8789 Other specified congenital malformation syndromes, not elsewhere classified: Secondary | ICD-10-CM | POA: Diagnosis not present

## 2023-02-16 DIAGNOSIS — Z3161 Procreative counseling and advice using natural family planning: Secondary | ICD-10-CM | POA: Diagnosis not present

## 2023-02-17 DIAGNOSIS — R109 Unspecified abdominal pain: Secondary | ICD-10-CM | POA: Diagnosis not present

## 2023-02-17 DIAGNOSIS — K91850 Pouchitis: Secondary | ICD-10-CM | POA: Diagnosis not present

## 2023-02-17 DIAGNOSIS — M6281 Muscle weakness (generalized): Secondary | ICD-10-CM | POA: Diagnosis not present

## 2023-02-17 DIAGNOSIS — R1084 Generalized abdominal pain: Secondary | ICD-10-CM | POA: Diagnosis not present

## 2023-02-17 DIAGNOSIS — Q8789 Other specified congenital malformation syndromes, not elsewhere classified: Secondary | ICD-10-CM | POA: Diagnosis not present

## 2023-02-18 ENCOUNTER — Telehealth
Admit: 2023-02-18 | Discharge: 2023-02-19 | Payer: BLUE CROSS/BLUE SHIELD | Attending: Student in an Organized Health Care Education/Training Program | Primary: Student in an Organized Health Care Education/Training Program

## 2023-02-18 DIAGNOSIS — G894 Chronic pain syndrome: Principal | ICD-10-CM

## 2023-02-18 DIAGNOSIS — R198 Other specified symptoms and signs involving the digestive system and abdomen: Secondary | ICD-10-CM | POA: Diagnosis not present

## 2023-02-18 DIAGNOSIS — R1084 Generalized abdominal pain: Secondary | ICD-10-CM | POA: Diagnosis not present

## 2023-02-18 DIAGNOSIS — K91850 Pouchitis: Secondary | ICD-10-CM | POA: Diagnosis not present

## 2023-02-18 DIAGNOSIS — Q8789 Other specified congenital malformation syndromes, not elsewhere classified: Secondary | ICD-10-CM | POA: Diagnosis not present

## 2023-02-18 DIAGNOSIS — R109 Unspecified abdominal pain: Secondary | ICD-10-CM | POA: Diagnosis not present

## 2023-02-18 DIAGNOSIS — D48119 Desmoid tumor of unspecified site: Secondary | ICD-10-CM | POA: Diagnosis not present

## 2023-02-18 MED ORDER — BUPRENORPHINE 20 MCG/HOUR WEEKLY TRANSDERMAL PATCH
MEDICATED_PATCH | TRANSDERMAL | 1 refills | 28.00 days | Status: CP
Start: 2023-02-18 — End: ?

## 2023-02-18 MED ORDER — FLUTICASONE PROPIONATE 50 MCG/ACTUATION NASAL SPRAY,SUSPENSION
Freq: Every day | NASAL | 2 refills | 120.00 days | Status: CP
Start: 2023-02-18 — End: 2024-02-18

## 2023-02-18 NOTE — Unmapped (Addendum)
 Chronic Pain Follow Up Note    Assessment and Plan    1. Chronic pain syndrome            [name] [name] is a 22 y.o. female with past medical history significant for DVT (RUE, s/p Eliquis x3 months), [name] syndrome s/p colectomy, desmoid tumor, anemia, and severe protein-calorie malnutrition who is being followed at [facility] for abdominal and right shoulder pain that is consistent with the diagnosis of cancer associated pain.    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 Pt has continued and now chronic pain related to her tumors, has R shoulder pain as well though this is better controlled now. Follows with [facility] Dr [name] as well as by [facility] oncology.    Unfortunately, [name] [name] has been experiencing significantly worse abdominal pain over the last several months.  The etiology of her pain flare cycles is complex with likely multiple contributing factors.  The most likely explanation appears that she has intermittent pain flares that are caused by an unclear underlying process (potentially due to intermittent intestinal obstructions in the setting of her known mesenteric desmoid tumors, which may act as anchor points for torsion of her bowels) that resulted in an increased opioid requirement and often will lead to hospitalization.  At this point, her acute pain will be treated with IV opioids which will precipitate narcotic bowel syndrome that, in conjunction with her severe visceral hyperalgesia, quickly causes a new pain generator.  This results in a positive feedback loop with deleterious consequences in which she requires more IV opioids to treat her pain but the additional IV opioids further the underlying issue.    Visceral hyperalgesia, narcotic bowel syndrome, desmoid tumors of the gastrointestinal tract, cancer related pain, chronic pain syndrome, allodynia, generalized abdominal pain Chronic, globally worsening; at this time patient believes pain has worsened since she started eating again and will gradually begin to improve.  Patient was recently admitted from July until October after having a severe pain flare resulting from significant shaking and pain that occurred after emerging from general anesthesia which she required for a steroid injection of a peripheral desmoid tumor.  She was hospitalized for many months, having only been out of the hospital for a short period of time prior to an earlier hospitalization over the summer, and her most recent hospital course was complicated by prolonged ileus, headaches, severe nausea, significant deconditioning and lower extremity weakness, bacteremia and fungemia, and prolonged acute inpatient rehab stay. Today, we will increase frequency of oxycodone (to take it 4-5 times a day instead of 3 times) and switch her to [place] and stop the suboxone per her preference as she has been averse to taking the suboxone due to medical PTSD and wants guaranteed analgesia in the form of transdermal Butrans, during this acute flare of pain due to increase Bms (15 a day) and she is closely following with GI since she was recently started on antibiotics which may worsen the Bms. We will follow up in 1 month to continue weaning opioids as able.     -Continue APAP 1000 mg 3 times daily  -Okay to continue ativan 0.5 mg p.o. prn for anxiety, patient will not take it at the same time as opioids or other sedating medications  -Refilled oxycodone 10 mg  4-5 times a day for the next 2 weeks during acute flare and will decrease  back to 3 times a day as needed after that, #120 tabs; x 1 month with plans to decrease to BID at the next visit  -discontinued suboxone and start Butrans 20mg  weekly   -Still working to wean off of all opioids      Of note: The patient has received both ketamine infusions and lidocaine infusions as an inpatient for flareups of her chronic abdominal pain and states that lidocaine infusions have been significantly more helpful in the past, though she is also tolerated ketamine infusions. Would recommend this in future hospitalizations to treat severe and uncontrolled abdominal pain exacerbations -previous lidocaine infusion was intermittently on due to transaminitis experienced by the patient which may or may not have been from the infusion but from TPN or an intra-abdominal process that was occurring.  She states that she did really well after the lidocaine infusion for a couple of weeks.    Myofascial pain   Chronic, ongoing, at goal. Not primary concern at this time. Patient continues to have subacute low back pain after having hyper flexion of her hip in the hospital. Patient states the hematoma has not changed from previous. At previous appointment, on exam an approximately 5 inches x 4 inches with no skin discoloration at this time but a hard subcutaneous nodule overlying the SI joint.  [name] was positive bilaterally.  Favor either hematoma versus gluteal tear versus SI joint pain. Pt reports plan for further imaging work up and we agree with this, otherwise plan to continue treating with PT.     - Consider repeat TPI PRN, though will hold off given her severe pain flare after recent desmoid injection  - Home exercise program  - Encouraged SI joint exercises  - Encouraged slow resumption of normal activity and times when pain has decreased  - Apply Voltaren gel BID to limit GI side effects  - Apply Lidocaine patches    Chronic, continuous use of opioids; chronic pain syndrome   Patient is managed on both Suboxone for long-acting pain control as a partial mu agonist and oxycodone for pain flares.  Working to wean patient off of oxycodone and then to work patient down and eventually off of buprenorphine to improve bowel function while still balancing the need for pain control.  Today, since she is in acute flare we will continue her oxycodone at increased frequency for about 2 weeks and switch from suboxone to [place].  Patient is currently managed on a benzodiazepine for anxiety and muscle spasms but notes that she is only taking 1 pill three times a week and knows not to take this around the time of her opioid and is working to wean this off as well.    -COMM score: 2    Medication Monitoring  [place] PDMP was reviewed today and it was appropriate.  Patient reported pill counts at home and they were appropriate  Last urine toxicology screen: Updated 12/23/2022  Treatment agreement last reviewed and signed: Updated 12/23/2022  Aberrancy with treatment plan? no  Opioid Risk: moderate (benzo)  Pain psychology: no  Benzodiazepines: yes, knows not to take alongside opioids  Naloxone: Yes, refilled on 12/23/2022  EKG: NSR with normal Qtc (437 on 11/01/2022)  I have reviewed the [facility] Medical Board statement on use of controlled substances for the treatment of pain as well as the CDC Guideline for Prescribing Opioids for Chronic Pain. I have reviewed the [place] Controlled Substance Monitoring Database.    Long Term Use of Opioid Medication   -  Compliant, using opioid to facilitate functional goals, no concerns for misuse     PDMP reviewed 02/18/2023 Appropriate   Pill count 02/18/2023 Appropriate 42 tabs of oxycodone left  49 suboxone films left    Urine toxicology 12/23/22 Appropriate +oxy, +benzo, +suboxone   Treatment agreement 02/18/23 Goals: continue with nursing school   Naloxone rx Date 12/23/22     COMM Date Score: 4   Benzodiazepine Yes Ok with minimal use per psychiatry, pt will avoid taking close to opioid doses and only while awake, not near bedtime   Pain Psychology No    QTC 11/01/22 NSR, Qtc 437   Concerns yes OIH, planning to wean off opioids, planning to minimize benzo use   Opioid Risk LOW      Opioid Changes and Yearly MME  Date MME Comments   12/23/22 45 + 1.5 mg BID of Suboxone Post-discharge regimen, increased suboxone to decrease oxy   01/21/23 45 + 1 mg TID Suboxone + additional 10 mg oxy per day x 10 days Split Suboxone to TID dosing for pain, gave additional #10 oxy 10 mg for additional 10 mg oxy per day for 10 days after wisdom tooth extractions   02/18/23 75 + 20 mcg/h Butrans Increased to oxy 10 mg x4-5 per day for 2-3 weeks given patient's current pain flare in setting of dramatically increased BM frequency, switched to Butrans given pt's anxiety surrounding Suboxone and desire for more consistent analgesia with Butrans that will also minimize pill burden; plan to wean off of oxy hereafter, Lavera Guise is a Scientist, product/process development net decrease from Suboxone              Return in about 4 weeks (around 03/18/2023).    PLAN:  Today I have prescribed:  Requested Prescriptions     Signed Prescriptions Disp Refills    buprenorphine (BUTRANS) 20 mcg/hour PTWK transdermal patch 4 patch 1     Sig: Place 1 patch on the skin every seven (7) days.    fluticasone propionate (FLONASE) 50 mcg/actuation nasal spray 16 g 2     Sig: 1 spray into each nostril daily.    oxyCODONE (ROXICODONE) 10 mg immediate release tablet 120 tablet 0     Sig: Please take 1 tab every 4 to 6 hours as needed for pain. Fill on or after: 02/25/23     No orders of the defined types were placed in this encounter.     HPI  [name] [name] is a 22 y.o. being followed at [facility] for complaint of chronic pain localized to her shoulders and abdomen.      Patient was last seen in November when The patient stated she had been doing better for about a week, but her back had progressively worsened and remained swollen, requiring her to use her wheelchair. She reported not sleeping and attributed this to taking Movantik, which caused diarrhea and abdominal pain. She mentioned having an active ulcer in her J-pouch and noted her pain had become excruciating but believed it would improve again once her GI medications were adjusted. She expressed a desire to become more independent and shared plans to attend [employer] nursing school. She was interested in developing a plan to wean off pain medications to eventually space out her appointments. The patient stated she had taken Lexapro once before but was unsure about any side effects; her mother was interested in her trying the medication again. She expressed concerns about sleeping through the times when she needed  pain medicine and was curious about restarting the Butrans patch, which she had previously found very helpful but had been discontinued during one of her hospitalizations. She reported being unable to do physical therapy or her home exercise program (HEP).    Since last visit patient has followed with psychiatry.     Today, reports not doing well the past 4-5 days. Has been having a lot of trouble with diet and feeding. She lost about 10 lbs in about 1.5 weeks and reports frequent bathroom trips (mostly stool)  about 15 times over night. She is unsure what triggered this episode. She does have stress of school with classes. She also has not been sleeping the past few days due to the frequent bathroom trips.   For her pain management, she has been taking extra oxycodone, she has been taking about 4 oxycodone pills per day max.      She was previously doing well and went to [place] to step away from her regular environment and had a great time without needing extra pain medications. She was able to push herself to do more and she is very proud  of this.      As for medications, she had been doing well and was not using oxycodone more than 3 times a day, and was only needing to take suboxone once a day.     Current analgesic regimen:  -Suboxone 1 mg BID  -Oxycodone 10mg  q8 PRN  -P.o. Valium 5 mg nightly as needed  -Tylenol PRN  -Voltaren 1% gel          Previous Medication Trials:  NSAIDS: Toradol (c/f GIB after)  Antidepressant/anxiety: Cymbalta (stopped due to mood side effects), amitriptyline (insomnia and increased J-pouch output)  Anticonvulsants: Gabapentin, Lyrica (both cause lower extremity edema)  Muscle relaxants: Robaxin, baclofen (most effective of the conventional therapies), p.o. Valium (attempting to limit due to opioid dependence)  Topicals: lidocaine, voltaren gel  Short-acting opiates: oxycodone, PO HM  Long-acting opiates: Buprenorphine  Other: Tylenol    Previous Interventions  Procedures:   - TPIs in August 2023 with initial pain flare, subsequent improvement, and near immediate relief flare of pain; hyperalgesic with procedure  - TPIs of Right trapezius, rhomboid, infraspinatus on 12/04/2021.  - TPIs on 10/25  - Celiac plexus block on 11/1 (severe post-procedural pain flare requiring admission for pain control, significant hyperalgesia)  - TPIs 11/22    Infusions:  Lidocaine infusions helped during inpatient exacerbations of pain more than ketamine infusions    PT: Yes    Allergies  Allergies   Allergen Reactions    Adhesive Tape-Silicones Hives and Rash     Paper tape and Tegederm ok. Biopatch causes blistering but has tolerated CHG Tegaderm    Ferrlecit [Sodium Ferric Gluconat-Sucrose] Swelling and Rash    Levofloxacin Swelling and Rash     Swelling in mouth, rash,     Methylnaltrexone      Per Patient: I lost bowel control, severe abdominal cramping, and elevated BP    Neomycin Swelling     Rxn after ear drops; ear swelling    Papaya Hives    Morphine Nausea And Vomiting    Zosyn [Piperacillin-Tazobactam] Itching and Rash     Red and itchy    Compazine [Prochlorperazine] Other (See Comments)     Extreme agitation    Iron Analogues     Iron Dextran Itching     Received iron dextran 06/08/22 over 12 hours, had itching and redness/flushing during  the infusion and for a couple days after. Required IV benadryl w/flares between doses and ultimately treated w/IV methylpred for 2 days.     Latex, Natural Rubber Rash       Home Medications    Current Outpatient Medications   Medication Sig Dispense Refill    acetaminophen (TYLENOL) 500 MG tablet Take 2 tablets (1,000 mg total) by mouth every eight (8) hours.      buprenorphine (BUTRANS) 20 mcg/hour PTWK transdermal patch Place 1 patch on the skin every seven (7) days. 4 patch 1    cholecalciferol, vitamin D3 25 mcg, 1,000 units,, 1,000 unit (25 mcg) tablet Take 1 tablet (25 mcg total) by mouth daily. 100 tablet 3    COURIERED MED OR SUPPLY OGSVEO 50 MG TABLET. 1 each 0    escitalopram oxalate (LEXAPRO) 5 MG tablet TAKE 1 TABLET (5 MG TOTAL) BY MOUTH DAILY. 90 tablet 1    famotidine (PEPCID) 20 MG tablet Take 1 tablet (20 mg total) by mouth nightly. 30 tablet 3    fluticasone propionate (FLONASE) 50 mcg/actuation nasal spray 1 spray into each nostril daily. 16 g 2    hydrocortisone 2.5 % cream Apply topically two (2) times a day as needed (hemorrhoids). 30 g 0    lidocaine (ASPERCREME) 4 % patch Place 2 patches on the skin daily. 60 patch 0    LORazepam (ATIVAN) 0.5 MG tablet Take 2 tablets (1 mg total) by mouth Three (3) times a day as needed for anxiety.      mirtazapine (REMERON) 15 MG tablet Take 1 tablet (15 mg total) by mouth nightly. 90 tablet 3    naloxegol (MOVANTIK) 12.5 mg tablet Take 1 tablet (12.5 mg total) by mouth daily. 30 tablet 0    naloxone (NARCAN) 4 mg nasal spray One spray in either nostril once for known/suspected opioid overdose. May repeat every 2-3 minutes in alternating nostril til EMS arrives 2 each 0    nirogacestat (OGSIVEO) 50 mg tablet Take 3 tablets (150 mg total) by mouth two (2) times a day. Swallow tablets whole; do not break, crush, or chew. 180 tablet 5    [START ON 02/25/2023] oxyCODONE (ROXICODONE) 10 mg immediate release tablet Please take 1 tab every 4 to 6 hours as needed for pain. Fill on or after: 02/25/23 120 tablet 0    pimecrolimus (ELIDEL) 1 % cream Apply topically two (2) times a day as needed. To face as needed for rash      scopolamine (TRANSDERM-SCOP) 1 mg over 3 days Place 1 patch (1 mg total) on the skin every third day. 10 patch 2    sucralfate (CARAFATE) 100 mg/mL suspension Take 10 mL (1 g total) by mouth four (4) times a day. 1200 mL 11    triamcinolone (KENALOG) 0.1 % cream Apply topically two (2) times a day as needed. Apply to rash as needed      zinc oxide-cod liver oil (DESITIN 40%) 40 % Pste Apply topically daily as needed. 57 g 0     No current facility-administered medications for this visit.       Review Of Systems  General weight loss  Cardiovascular irregular heartbeat  Gastrointestinal nausea and stomach pain  Skin none  Endocrine none  Musculoskeletal spasms/spacticity/cramps, back pain, muscle aches/weakness  Neurologic none  Psychiatric anxiety      Physical Exam    VITALS:   There were no vitals filed for this visit.  Wt Readings from Last 3 Encounters:   01/23/23 57.4 kg (126 lb 9.6 oz)   01/21/23 58 kg (127 lb 12.8 oz)   01/14/23 58 kg (127 lb 12.8 oz)     GENERAL:  The patient is well developed, well-nourished and appears to be in no apparent distress. The patient is pleasant and interactive. Patient is a good historian.  HEENT:    Reveals normocephalic/atraumatic. Clear sclera. Mucous membranes are moist.  RESPIRATORY:   Normal work of breathing.  No supplemental oxygen.  GASTROINTESTINAL:   Ostomy   NEUROLOGIC:    The patient was alert and oriented, speech fluent, normal language.   MUSCULOSKELETAL:    Unable to exam due to virtual visit     SKIN:   No obvious rashes, lesions, or erythema.  PSYCHIATRIC:  Appropriate mood and affect.  No pressured speech.      Marland Kitchen

## 2023-02-18 NOTE — Unmapped (Signed)
[name] [name] from pharmacy is calling requesting clarification on Oxycodone script that was sent in today. The directions and quantity does not match please send in updated script, we sent in a 40 day supply

## 2023-02-19 DIAGNOSIS — Q8789 Other specified congenital malformation syndromes, not elsewhere classified: Secondary | ICD-10-CM | POA: Diagnosis not present

## 2023-02-19 DIAGNOSIS — K91850 Pouchitis: Secondary | ICD-10-CM | POA: Diagnosis not present

## 2023-02-19 DIAGNOSIS — R109 Unspecified abdominal pain: Secondary | ICD-10-CM | POA: Diagnosis not present

## 2023-02-19 DIAGNOSIS — R1084 Generalized abdominal pain: Secondary | ICD-10-CM | POA: Diagnosis not present

## 2023-02-19 MED FILL — OGSIVEO 50 MG TABLET: ORAL | 15 days supply | Qty: 90 | Fill #5

## 2023-02-19 NOTE — Unmapped (Signed)
Patient sent the following update; [name] [name],     I went to flush my feeding tube this morning and it is clogged. It has been running sluggish the last couple of weeks but with warm water flushes I have been able to keep it unclogged.     Can I pull the line myself or does it need to be done in the clinic?      I thought I could see how I do over the weekend with eating? I have an appointment with [name] [name] on Tuesday. If it doesn't go well over the weekend then I could come to your office and have it reinserted then.      I weighed this morning and I am 120 and have been able to take in some smoothies and other very small amounts of food.      Please let me know how I should proceed.  Thank you,  [name] [name]    Will confirm patient has NG tube which would be ok to pull at home independently per [name] [name],  can consider replacement next week if needed.  Continue to increase PO intake as tolerated and monitor weight.

## 2023-02-19 NOTE — Unmapped (Signed)
Addended by: Jolyn Lent. on: 02/19/2023 12:14 PM     Modules accepted: Level of Service

## 2023-02-19 NOTE — Unmapped (Signed)
I am very sorry to hear about the recent pain flare though I am very happy that you are able to go to [place]!    Given your pain flare, it is okay to start using 4-5 oxycodone tablets per day.  We have adjusted your prescriptions so that you can use up to this amount for the next few weeks.  It is totally okay to stop the Suboxone, we have sent in a prescription for Butrans 20 mcg/h with 1 refill to be used instead.  We have also sent in fluticasone spray to help with skin irritation.    Please let us know if you have any difficulties with filling your medications at the pharmacy    We will see you back in January

## 2023-02-20 DIAGNOSIS — R1084 Generalized abdominal pain: Secondary | ICD-10-CM | POA: Diagnosis not present

## 2023-02-20 DIAGNOSIS — R109 Unspecified abdominal pain: Secondary | ICD-10-CM | POA: Diagnosis not present

## 2023-02-20 DIAGNOSIS — Q8789 Other specified congenital malformation syndromes, not elsewhere classified: Secondary | ICD-10-CM | POA: Diagnosis not present

## 2023-02-20 DIAGNOSIS — K91850 Pouchitis: Secondary | ICD-10-CM | POA: Diagnosis not present

## 2023-02-21 DIAGNOSIS — K91850 Pouchitis: Secondary | ICD-10-CM | POA: Diagnosis not present

## 2023-02-21 DIAGNOSIS — Q8789 Other specified congenital malformation syndromes, not elsewhere classified: Secondary | ICD-10-CM | POA: Diagnosis not present

## 2023-02-21 DIAGNOSIS — R109 Unspecified abdominal pain: Secondary | ICD-10-CM | POA: Diagnosis not present

## 2023-02-21 DIAGNOSIS — R1084 Generalized abdominal pain: Secondary | ICD-10-CM | POA: Diagnosis not present

## 2023-02-22 DIAGNOSIS — Q8789 Other specified congenital malformation syndromes, not elsewhere classified: Secondary | ICD-10-CM | POA: Diagnosis not present

## 2023-02-22 DIAGNOSIS — R109 Unspecified abdominal pain: Secondary | ICD-10-CM | POA: Diagnosis not present

## 2023-02-22 DIAGNOSIS — K91850 Pouchitis: Secondary | ICD-10-CM | POA: Diagnosis not present

## 2023-02-22 DIAGNOSIS — R1084 Generalized abdominal pain: Secondary | ICD-10-CM | POA: Diagnosis not present

## 2023-02-23 DIAGNOSIS — M6281 Muscle weakness (generalized): Secondary | ICD-10-CM | POA: Diagnosis not present

## 2023-02-23 NOTE — Unmapped (Signed)
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    Desmoid fibromatosis, history of [name]     She has had numerous sites of disease, including paraspinal, chest wall, right arm/wrist. She has had several cryoablations with Dr. Cathie [name] which have seemed to offer clinical benefit. She trialed sorafenib which seemed to lead to shrinkage of lesions, but she had significant intolerance with dizziness, presyncope, fatigue, hair loss, rash, GI side effects.      We have discussed at length the nature of desmoid fibromatosis tumors, the fact they can wax and wane independent of therapy, are not cancers in a metastatic potential, but can be locally infiltrative and cause substantial morbidity to the site of disease and occasionally mortality. I reviewed that the emerging standard of care is observation based on the fact that 20-30% will experience spontaneous regression in the year following diagnosis. Given her long experience with desmoids, complete regression may be less likely, but we will still approach this cautiously with parallel goals of avoidance of overtreatment (particularly surgical) and palliation of symptoms / preservation of function.     At first visit in 2022, her lesions are in the shoulder, right forearm, chest area, with a possible desmoid in her left thigh. Since then, she has had waxing waning course with significant pain issues, malnutrition requiring intermittent TPN and tube feeds, multiple GI bleeds, significant anxiety, depression, multiple hospitalizations. Had tumor response to sorafenib but developed GI bleeding and had to stop. IV iron infusion led to severe anaphylaxis.    09/16/22: Start nirogacestat.     09/25/22: Admitted with uncontrolled pain and muscle spasms after IR guided procedure. Prolonged hospital stay, neurologic evaluation, managed with muscle relaxers (baclofen, valium). Discharge 9/11 to rehab. Ramped up on nirogacestat with some side effects, discharged from rehab 10/2.    12/16/22: Tolerating nirogacestat reasonably well. Dealing with hot flashes, edema, nausea, muscle spasticity, anxiety, pain. Resumed mirtazepine and movantik for nausea, appetite, constipation. Resumed lexapro 5mg  for hot flashes and mood symptoms.    01/23/23: Ordered MRI. Ziopatch for palpitations. Reconnect with mental health.    02/24/23:  Assessment & Plan  Desmoid Fibromatosis  Multifocal mesenteric and extremity involvement with significant tumor-associated pain, anxiety, malnutrition, and ileus. Currently on nirogacestat. Recent increase in pain, GI bleeding, and anxiety. Concerns about potential new tumor growth due to itching in the back. Discussed risks of nirogacestat including ovarian suppression and potential exacerbation of menopausal symptoms. Avoided estrogen supplementation due to potential impact on desmoid tumors.  - Continue nirogacestat  - Upcoming MRI - will pay particular attention to SI joint along with re-staging of desmoid tumors    Gastrointestinal Bleeding  Recent increase in GI bleeding with maroon stools and blood clots on her NG tube which has come out. Hemoglobin and platelets within normal range but MCV slightly low, indicating possible iron deficiency.   - Continue monitoring hemoglobin and platelets  - Order iron studies  - Appreciate [facility] in her care    Chronic Pain  Severe pain in lower back, hip, and feet. Pain  regimen adjusted by Dr. Manson [name] to include a patch and increased oxycodone. Pain exacerbated by recent stress and physical activity. Risks of increased opioid use include dependency and side effects such as constipation and sedation. Benefits include better pain control.  - Continue current pain management regimen  - Follow up with orthopedic for MRI results    Anxiety and PTSD  Severe anxiety related to eating, medical procedures, and recent hospitalization. Officially diagnosed with PTSD. Difficulty managing anxiety with current medications. Discussed potential benefits and risks of starting Lexapro (SSRI) including initial side effects and potential improvement in anxiety and PTSD symptoms. Prazosin discussed as a future option for PTSD-related symptoms but deferred due to current blood pressure concerns.  - Start Lexapro (SSRI)  - Follow up with [name] [name] for mental health management    Malnutrition and Dehydration  Significant weight loss (approximately 30 pounds) and poor appetite. Concerns about dehydration and nutritional deficiencies. Discussed potential risks of malnutrition including muscle wasting, weakened immune system, and delayed wound healing. Benefits of nutritional support include improved overall health and recovery.  - Follow up with nutritionist  - Monitor weight and hydration status  - Order vitamin and iron panel, guided by nutrition input  - Encourage increased fluid intake    Menopausal Symptoms  Hot flashes, night sweats, and absence of menstrual cycle likely due to ovarian suppression from nirogacestat. Concerns about starting estrogen due to potential impact on desmoid tumors. Discussed non-estrogenic medications for symptom management and potential risks and benefits.  - Avoid estrogen supplementation  - Consider non-estrogenic medications for symptom management  - Discuss with [name] [name] for further recommendations    Cardiac Symptoms  Persistent tachycardia and chest pain. Recent echocardiogram normal. Symptoms may be exacerbated by pain and anxiety. Discussed potential risks of untreated tachycardia including increased cardiac workload and potential for arrhythmias. Benefits of continuous monitoring include better management of symptoms.  - Order Zio patch for continuous cardiac monitoring  - Monitor heart rate and blood pressure  - Evaluate for potential dehydration and anemia    General Health Maintenance  Routine follow-up and management of multiple chronic conditions. Emphasis on mental health, pain management, and nutritional status.  - Encourage good oral hygiene  - Continue physical therapy  - Monitor for any new symptoms or changes in existing conditions    Follow-up  - Follow up with orthopedic on December 29  - Follow up with nutritionist on Thursday  - Follow up with [name] [name] for mental health management  - Coordinate with [name] [name] regarding menopausal symptoms.    Plan:  -RTC 1 month for tox check + scan review    I personally reviewed the medical records, pathology and laboratory results and viewed the imaging. All questions were answered to the patient's apparent satisfaction and they voiced understanding and agreement with the plan. Pt has my card and contact information and is encouraged to reach out with any further questions.    Donzetta Sprung, MD/MPH  Assistant Professor  Bone and Soft Tissue Oncology Program  [facility]  Pager: [phone]  Nurse Navigator: Roseanne Reno RN, BSN, OCN      REASON FOR CONSULTATION:   Caci Wanser is a 22 y.o. female who is seen in consultation at the request of [name] Jonita Albee, MD for evaluation of her desmoid fibromatosis    HISTORY OF PRESENT ILLNESS:      Attends school at [employer] in [place], [place]      Right arm  desmoid is the most bothersome - significant arm cramping, deep aching pain, lasts for ~30 min after cramping starts. Has noticed right hand shaking frequently. 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     Left chest lesion used to be quite uncomfortable, caused swelling in her arm and discomfort. Improved after cryoablation.     Right shoulder blade hurts when she leans back against it, fairly painful. Right chest wall lesion has some associated pain, improving since cryo.     First diagnosed at 22 years old - seen in [place] at first, had numerous cysts. First major issues were two paraspinal lesions, referred to [facility] for biopsy and ultimately resection.  Soon diagnosed with [name], seen by medical genetics early on.  Guided by Dr. Debbe Mounts, referred out to proceduralists as needed.  2011 - had a growing lesion on her back as well as bilateral neck lesions, resected at [facility], found to be desmoid  02/2012 - surgery for thoracic spine desmoid. Maybe another flank lesion removed  2017 - wrist lesion treated by [facility] with bupivicaine, kenalog  08/2016 - endoscopy with adenomatous polyp of the colon, non-dysplastic  02/2017 - subcutaneous lesion resection by plastic surgery (postauricular and RUE, RLE) - path revealed epidermal inclusion cysts, osteomas  05/2018: cryotherapy of left anterior chest wall lesion  05/2018: started sulindac but struggled with adherence due to GI side effects  08/2018: cryo #2 to chest wall lesion  12/10/2018: started sorafenib due to progression in left paraspinal lesion, ?posterior chest wall lesion  02/2019: discontinued sorafenib for several reasons: fatigue, dizziness, nausea, hair loss, as well as COVID infection in late November. Derm consulted for rash / hair loss, started on antihistamines, topical steroids. Trialed lower dose but ultimately stopped again 05/2019 due to toxicity.  06/2019: follow up with surgical oncology - plans for observation rather than repeat surgery  07/2019: cryoablation of anterior chest wall desmoid  07/2019: excision of subcutaneous LE mass and L neck masses, clinically appeared consistent with cysts  08/2019: cryoablation of R posterior chest wall, left paraspinal desmoid     Interval History:  History of Present Illness  The patient is a 22 year old individual with a complex medical history, including desmoid fibromatosis with multifocal involvement, familial adenomatous polyposis ([name]), and is currently on nirogacestat. The patient's course has been complicated by significant tumor-associated pain, anxiety, malnutrition, ileus, and other challenges.    The patient reports a recent weight loss of approximately eight pounds, which she attributes to a combination of stress, increased pain, and gastrointestinal issues. She describes an increase in pain, particularly in the lower back and hip, which has been severe enough to cause numbness in the feet and disrupt sleep. The patient also reports frequent gastrointestinal bleeding and difficulty with tube feeds due to anxiety and technical issues.    The patient's heart rate has been consistently high, and she has been experiencing chest pain and palpitations. She also reports a significant increase in her pain medication intake, from two to three oxycodone tablets to up to five.    The patient has been experiencing significant cognitive issues, including memory problems and difficulty concentrating. She has also been diagnosed with post-traumatic stress disorder (PTSD) following a traumatic hospitalization experience.    The patient has been experiencing menopausal symptoms, including hot flashes and sleep disturbances, which she attributes to nirogacestat. She has also been experiencing significant bruising and poor wound healing, which she believes may be related to her medication or nutritional status.  The patient has been working with a physical therapist due to issues with her right hip and lower back. She reports that her right hip has been popping in and out of place, and her feet have been going numb. The physical therapist has noted that the patient's right hip is less flexible than the left, and that the patient's back pain and sleep disturbances are not improving.    The patient has also been experiencing significant anxiety, particularly around eating and tube feeds. She reports a recent episode where her feeding tube became occluded and she had to stay awake all night to manage it. The patient's anxiety has been so severe that she has been diagnosed with PTSD and is struggling to manage her symptoms.    In summary, this is a 22 year old individual with a complex medical history, currently experiencing significant pain, gastrointestinal issues, cardiovascular symptoms, cognitive issues, and severe anxiety. Her condition has been complicated by malnutrition, sleep disturbances, and recent weight loss.    REVIEW OF SYSTEMS:  A comprehensive review of 12 systems was negative except for pertinent positives noted in HPI.    Past Medical History, Surgical History, Family History were reviewed personally. Any changes were updated as above.    Social History:  Attended [employer] in [place], has been interrupted by health issues  Wants to be a Engineer, civil (consulting) in pediatric hematology/oncology   Mother Natalia Leatherwood is a strong advocate, often present at visits    Pregnancy status: premenopausal, interested in future children    ALLERGIES/MEDICATIONS:  Reviewed in Epic    PHYSICAL EXAM:   Resp 16  - Ht 170.2 cm (5' 7)  - Wt 54.1 kg (119 lb 4.8 oz)  - BMI 18.69 kg/m??   ECOG 1  Gen - well appearing, mother accompanying her   Eyes - conjunctivae clear, PERRL  ENT -no scleral icterus, moist mucous membranes  Lymph - no cervical or supraclavicular lymphadenopathy appreciated  Resp - clear to auscultation bilaterally, no wheezes, crackles or rales  CV - normal rate, regular rhythm, no lower extremity edema noted  Skin - flushing of skin noted on chest  Neuro - AOx3, EOM intact, no facial droop, speech fluent and coherent, moves all extremities without asymmetry  Psych - affect anxious  MSK - no joint tenderness or swelling    PATHOLOGY:   Pathology was personally reviewed as described in the HPI, detailed in Epic.     LABS:  Reviewed in Epic    RADIOLOGY:  Imaging was personally viewed and interpreted as summarized in the history (see above).

## 2023-02-24 ENCOUNTER — Ambulatory Visit: Admit: 2023-02-24 | Discharge: 2023-02-24 | Payer: BLUE CROSS/BLUE SHIELD

## 2023-02-24 ENCOUNTER — Other Ambulatory Visit: Admit: 2023-02-24 | Discharge: 2023-02-24 | Payer: BLUE CROSS/BLUE SHIELD

## 2023-02-24 DIAGNOSIS — E611 Iron deficiency: Principal | ICD-10-CM

## 2023-02-24 DIAGNOSIS — D48119 Desmoid tumor of unspecified site: Secondary | ICD-10-CM | POA: Diagnosis not present

## 2023-02-24 DIAGNOSIS — K922 Gastrointestinal hemorrhage, unspecified: Secondary | ICD-10-CM | POA: Diagnosis not present

## 2023-02-24 DIAGNOSIS — K56609 Unspecified intestinal obstruction, unspecified as to partial versus complete obstruction: Secondary | ICD-10-CM | POA: Diagnosis not present

## 2023-02-24 DIAGNOSIS — M545 Low back pain, unspecified: Secondary | ICD-10-CM | POA: Diagnosis not present

## 2023-02-24 DIAGNOSIS — R079 Chest pain, unspecified: Secondary | ICD-10-CM | POA: Diagnosis not present

## 2023-02-24 DIAGNOSIS — G893 Neoplasm related pain (acute) (chronic): Secondary | ICD-10-CM | POA: Diagnosis not present

## 2023-02-24 DIAGNOSIS — E46 Unspecified protein-calorie malnutrition: Secondary | ICD-10-CM | POA: Diagnosis not present

## 2023-02-24 DIAGNOSIS — M79671 Pain in right foot: Secondary | ICD-10-CM | POA: Diagnosis not present

## 2023-02-24 DIAGNOSIS — F410 Panic disorder [episodic paroxysmal anxiety] without agoraphobia: Secondary | ICD-10-CM | POA: Diagnosis not present

## 2023-02-24 DIAGNOSIS — M79672 Pain in left foot: Secondary | ICD-10-CM | POA: Diagnosis not present

## 2023-02-24 DIAGNOSIS — R609 Edema, unspecified: Secondary | ICD-10-CM | POA: Diagnosis not present

## 2023-02-24 DIAGNOSIS — F32A Depression, unspecified: Secondary | ICD-10-CM | POA: Diagnosis not present

## 2023-02-24 DIAGNOSIS — F411 Generalized anxiety disorder: Secondary | ICD-10-CM | POA: Diagnosis not present

## 2023-02-24 DIAGNOSIS — M25559 Pain in unspecified hip: Secondary | ICD-10-CM | POA: Diagnosis not present

## 2023-02-24 DIAGNOSIS — Z79899 Other long term (current) drug therapy: Secondary | ICD-10-CM | POA: Diagnosis not present

## 2023-02-24 DIAGNOSIS — R413 Other amnesia: Secondary | ICD-10-CM | POA: Diagnosis not present

## 2023-02-24 LAB — COMPREHENSIVE METABOLIC PANEL
ALBUMIN: 3.8 g/dL (ref 3.4–5.0)
ALKALINE PHOSPHATASE: 97 U/L (ref 46–116)
ALT (SGPT): 22 U/L (ref 10–49)
ANION GAP: 12 mmol/L (ref 5–14)
AST (SGOT): 19 U/L (ref <=34)
BILIRUBIN TOTAL: 0.4 mg/dL (ref 0.3–1.2)
BLOOD UREA NITROGEN: 11 mg/dL (ref 9–23)
BUN / CREAT RATIO: 21
CALCIUM: 10.3 mg/dL (ref 8.7–10.4)
CHLORIDE: 105 mmol/L (ref 98–107)
CO2: 26 mmol/L (ref 20.0–31.0)
CREATININE: 0.52 mg/dL — ABNORMAL LOW (ref 0.55–1.02)
EGFR CKD-EPI (2021) FEMALE: >90 mL/min/{1.73_m2} (ref >=60)
GLUCOSE RANDOM: 101 mg/dL (ref 70–179)
POTASSIUM: 3.5 mmol/L (ref 3.4–4.8)
PROTEIN TOTAL: 7.9 g/dL (ref 5.7–8.2)
SODIUM: 143 mmol/L (ref 135–145)

## 2023-02-24 LAB — CBC W/ AUTO DIFF
BASOPHILS ABSOLUTE COUNT: 0 10*9/L (ref 0.0–0.1)
BASOPHILS RELATIVE PERCENT: 0.4 %
EOSINOPHILS ABSOLUTE COUNT: 0.3 10*9/L (ref 0.0–0.5)
EOSINOPHILS RELATIVE PERCENT: 2.8 %
HEMATOCRIT: 39.8 % (ref 34.0–44.0)
HEMOGLOBIN: 13.5 g/dL (ref 11.3–14.9)
LYMPHOCYTES ABSOLUTE COUNT: 2 10*9/L (ref 1.1–3.6)
LYMPHOCYTES RELATIVE PERCENT: 19.4 %
MEAN CORPUSCULAR HEMOGLOBIN CONC: 34.1 g/dL (ref 32.0–36.0)
MEAN CORPUSCULAR HEMOGLOBIN: 26.4 pg (ref 25.9–32.4)
MEAN CORPUSCULAR VOLUME: 77.5 fL — ABNORMAL LOW (ref 77.6–95.7)
MEAN PLATELET VOLUME: 10.4 fL (ref 6.8–10.7)
MONOCYTES ABSOLUTE COUNT: 0.7 10*9/L (ref 0.3–0.8)
MONOCYTES RELATIVE PERCENT: 7 %
NEUTROPHILS ABSOLUTE COUNT: 7.1 10*9/L (ref 1.8–7.8)
NEUTROPHILS RELATIVE PERCENT: 70.4 %
PLATELET COUNT: 289 10*9/L (ref 150–450)
RED BLOOD CELL COUNT: 5.13 10*12/L (ref 3.95–5.13)
RED CELL DISTRIBUTION WIDTH: 14.1 % (ref 12.2–15.2)
WBC ADJUSTED: 10.1 10*9/L (ref 3.6–11.2)

## 2023-02-25 DIAGNOSIS — D48119 Desmoid tumor: Principal | ICD-10-CM

## 2023-02-25 MED ORDER — OXYCODONE 10 MG TABLET
ORAL_TABLET | Freq: Three times a day (TID) | ORAL | 0 refills | 40.00 days | Status: CN | PRN
Start: 2023-02-25 — End: 2023-02-18

## 2023-02-26 ENCOUNTER — Telehealth
Admit: 2023-02-26 | Discharge: 2023-02-26 | Payer: BLUE CROSS/BLUE SHIELD | Attending: Registered" | Primary: Registered"

## 2023-02-26 ENCOUNTER — Telehealth: Admit: 2023-02-26 | Discharge: 2023-02-27 | Payer: BLUE CROSS/BLUE SHIELD

## 2023-02-26 DIAGNOSIS — D48119 Desmoid tumor: Principal | ICD-10-CM

## 2023-02-26 DIAGNOSIS — F411 Generalized anxiety disorder: Principal | ICD-10-CM

## 2023-02-26 DIAGNOSIS — F410 Panic disorder [episodic paroxysmal anxiety] without agoraphobia: Principal | ICD-10-CM

## 2023-02-26 DIAGNOSIS — F331 Major depressive disorder, recurrent, moderate: Principal | ICD-10-CM

## 2023-02-26 NOTE — Unmapped (Signed)
[name] [name]  Psychiatry--Comprehensive Cancer Support Program   Established Patient E&M Service - Outpatient       Assessment:    [name] [name] presents for follow-up evaluation. She states that she has been struggling with anxiety over taking her medications and she has not been compliant with all of her medications lately. She has not started the escitalopram 5 mg due to her anxiety, so I encouraged her to start with 1/2 tablet. She will use lorazepam 0.5 mg PRN, but sparingly. She recognizes that she needs therapy, so I was able to find the therapist information that [name], LCSW had recommended prior to her departure from [facility]. In addition to the therapist information, I provided her with information about [employer]. Providing supportive psychotherapy. Will reassess on 03/23/23.     Identifying Information:  [name] [name] is a 22 y.o. 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 major depressive disorder and generalized anxiety disorder.     Risk Assessment:  An assessment of suicide and violence risk factors was performed as part of this evaluation and is not significantly increased from the last visit.   While future psychiatric events cannot be accurately predicted, the patient does not currently require acute inpatient psychiatric [name] and does not currently meet [facility] involuntary commitment criteria.      Plan:      Problem 1: Major Depressive Disorder  Status of problem: chronic with mild to moderate exacerbation  Interventions:   Continue mirtazapine 7.5 mg at bedtime. Patient states that prior dose escalations were not tolerable, so will stay at 7.5 mg.   Start escitalopram 5 mg daily. This was ordered in October by Dr. [name], but she has not started this medication. Significant fear over medication trials reported. Encouraged her to reconsider taking this medication given the severity of her mood and anxiety and she did agree to consider use of this medication further. Recommended that she start with 1/2 tablet given her ongoing fear.  Supportive psychotherapy.   Provided patient with the therapist information that [name] had recommended at [facility] or one of his associates).   Provided patient with information re: [facility]    Problem 2: Generalized Anxiety Disorder  Status of problem: chronic with moderate to severe exacerbation  Interventions:   See MDD plan above  Continue lorazepam 0.5 mg PRN. Patient is using this medication sparingly     Problem 3: Insomnia  Status of problem:  chronic issue  Interventions:   Will plan to provide sleep hygiene education  See mirtazapine plan above. Patient struggling with sleep despite taking 7.5 mg nightly. Will plan to explore alternatives.        Patient has been given this writer's contact information as well as the [facility] Psychiatry urgent line number. The patient has been instructed to call 911 for emergencies.      Subjective:    Chief complaint:  Follow-up psychiatric evaluation for anxiety and depression    Interval History:   She continues to express grief over her former therapist's resignation. She's finding it hard to adjust to this change. She recently met with Dr. [name], her oncologist, and she states that she was especially emotional during that appointment because her therapist used  to attend appointments with her and she no longer has that same support.     She states that talking with me a few weeks ago about her trauma symptoms was validating to her. She recognizes how difficult her last hospital stay was and that she now experiences flashbacks of bad things that had happened to her in the hospital. She finished her semester and was pleased to report that she earned an A in both of her classes. She recognized a decrease in anxiety after finishing the semester, but her anxiety is elevated again in the past few days.     She had issues with her feeding tube over the past month and ended up losing around 10 pounds. She's eating on her own and doing a better job with this than she had expected.    She did not start the Lexapro that Dr. [name] had ordered. She states that she had stopped several of her other medications also. Feeling fearful of taking medications. Experiencing panic attacks. Finding relief from Ativan, but it's clear that she is using the Ativan sparingly. She has only used the Ativan twice since Monday. She finds that lorazepam 0.5 mg is helpful for elevated anxiety, but not for panic. We discussed the potential of starting the Lexapro at 1/2 tablet (2.5 mg) for awhile to ease into taking an SSRI.     She endorses significant disappointment that her friends her age are living independently and she had to lose her independence and move back in with her parents due to her illness. Significant loss of control.     Patient wishes that she knew someone with the same circumstances to talk with, so we discussed the potential of using [facility]. Patient participated in EMDR in the past and found it very helpful. She recognizes that she needs a therapist and she knows that [name] had provided her with information before she resigned (I was able to find this for her).     Prior Psychiatric Medication Trials: amitriptyline (could not tolerate), diazepam, duloxetine (could not tolerate), hydroxyzine (urinary retention), olanzapine (urinary retention)     Psychiatric Review of Systems  Depressive Symptoms: anhedonia, depressed mood, difficulty concentrating, fatigue, feelings of worthlessness/guilt, hopelessness, insomnia, and decreased motivation  Manic Symptoms: N/A  Psychosis Symptoms: N/A  Anxiety Symptoms: excessive anxiety and worry, difficulty controlling worry, easily fatigued, difficulty concentrating, muscle tension, difficulty falling asleep, difficulty staying asleep   Obsession/Compulsions: checks outlets at times and double checks the number of pills in her bottles after taking the medications  Panic Symptoms: recurrent, unexpected panic attacks, accelerated heart rate, numbness/tingling, I can't think rationally  Trauma Symptoms: upsetting thoughts/images, feeling emotional when reminded of the trauma, avoidance, feeling distant or cut off from people around her, insomnia, difficulty concentrating, feeling as if her future hopes or plans will not come true  Sleep disturbance: frequent night time awakening and difficulty falling asleep       Objective:      Mental Status Exam:  Appearance:    Appears stated age, Well nourished, and Clean/Neat   Motor:   No abnormal movements   Speech/Language:    Normal rate, volume, tone, fluency   Mood:    OK   Affect:   Calm, Cooperative, and Euthymic   Thought process and Associations:   Logical, linear, clear, coherent, goal directed   Abnormal/psychotic thought content:     Denies SI, HI, self harm, delusions, obsessions, paranoid ideation, or ideas of reference   Perceptual  disturbances:     Denies auditory and visual hallucinations, behavior not concerning for response to internal stimuli     Other:   Insight and judgment intact     I personally spent 63 minutes face-to-face and non-face-to-face in the [name] of this patient, which includes all pre, intra, and post visit time on the date of service.    Visit was completed by video (or phone) and the appropriate disclaimer has been included below.    The patient reports they are physically located in [place] and is currently: at home. I conducted a audio/video visit. I spent  35m 40s on the video call with the patient. I spent an additional 15 minutes on pre- and post-visit activities on the date of service .        Sheran Lawless, PMHNP  02/26/2023

## 2023-02-26 NOTE — Unmapped (Addendum)
Hi [name] [name],     Here is the link to [employer] where they work to connect you with someone with a similar diagnosis and situation: [email or web address]    Here is the information that [name] [name] provided about a therapist:    The therapist I have in mind that I think it would be so great for you to connect with is [name] [name]. He is super friendly and skilled, and has tons of experience in pediatric cancer and AYA cancer. He would also love to support you as you consider how and whether you want to be involved in the AYA cancer/illness space professionally.    He works at [employer]: [email or web address] - they take your insurance!    They also have some other great therapists, several of whom are also experienced in the same field. If you prefer to see a female therapist, [name] [name] would be more than happy to get you on the waitlist for one of them!    You can reach out to [name] [name] at [name]. If it's helpful, you can just edit this template to send:    Hi [name] [name], my name is ____. I've worked with [name] [name] and she encouraged me to connect with you. Do you have time soon that we could talk on the phone for 15 minutes? I'm usually free around _____. My number is _____.    Then you can just chat with him briefly (for free!) to get a sense of whether he'd be a good fit or ask him to connect you with one of his female colleagues.    [name] [name]         Comprehensive Cancer Support Program (CCSP) - Psychiatry Outpatient Clinic   After Visit Summary    It was a pleasure to see you today in the [name]s Comprehensive Cancer Support Program (CCSP). The CCSP is a multidisciplinary program dedicated to helping patients, caregivers, and families with cancer treatment, recovery and survivorship.      To schedule, cancel, or change your appointment:  Please call the [name] schedulers at [phone], Monday through Friday 8AM - 5PM. Someone will return your call within 24 hours.      If you have a question about your medicines or you need to contact your provider:  First, try sending a My Chart message to [name] [name], [name] [name]. If you are unable to do so, please call the CCSP program coordinator, [name] [name], at [phone].     For after hours urgent issues, you may call [phone] or call the I need to talk line at [phone]-TALK ([phone]) anytime 24/7.    CCSP Patient and Family Resource [name]: [phone].    CCSP Website:  [email or web address]    For prescription refills, please allow at least 24 hours (during business hours, M-F) for providers to call in refills to your pharmacy. We are generally unable to accommodate same-day requests for refills.     If you are taking any controlled substances (such as anxiety or sleep medications), you must use them as the directions say to use them. We generally do not provide early refills over the phone without clear reason, and it would be inappropriate to obtain the medications from other doctors. We routinely use the [place] controlled substance database to monitor prescription drug use.

## 2023-02-26 NOTE — Unmapped (Signed)
Nutrition Plan:   Continue to increase oral intakes as tolerated. Review handouts on low fiber diet, tips for increasing calories, and tips for adding protein sent in MyChart first visit and implement suggestions per individual tolerance. Generally would aim for small, frequent intakes throughout the day. Consider setting an alarm or timer if this helps remind you to eat at more routine intervals. High calorie/high protein liquids are likely to be an efficient way to add nutrition between meals.   Consider eating food sources of soluble fiber (e.g. apples, barley, beans, carrots, citrus, oats, peas) as tolerated in small amounts to see if this helps reduce wateriness of stool, given Banatrol unsuccessful.   If the multivitamin you have is not well tolerated, consider chewable vitamin (e.g. Flintstones) as tolerated instead.This is particularly important now that you are not receiving nutrition support.

## 2023-02-26 NOTE — Unmapped (Signed)
Received call from the pharmacy they are requesting provider call them to provide information on patients medication. They received 2  scripts on 02/18/23 from [name] and another script from Dr. Pricilla Riffle and they need to speak with provider so they can get patient her Oxycodone.

## 2023-02-26 NOTE — Unmapped (Signed)
[facility]   Medical Nutrition Therapy Consultation       Visit Type:    Return Assessment    Referral Reason:   [name]         [name] [name] is a 22 y.o. female seen for medical nutrition therapy. Her active problem list, medication list, allergies, and lab results were reviewed.      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       Anthropometrics   Height: 170.2 cm (5' 7)   Weight: 54.1 kg (119 lb 4.8 oz) (last documented weight 02/24/23; on 02/26/23, patient states that this weight was taken at appointment with jeans and shoes on, so thinks she may be a little less than this)   Body mass index is 18.69 kg/m??.    Wt Readings from Last 5 Encounters:   02/24/23 54.1 kg (119 lb 4.8 oz)   01/23/23 57.4 kg (126 lb 9.6 oz)   01/21/23 58 kg (127 lb 12.8 oz)   01/14/23 58 kg (127 lb 12.8 oz)   12/16/22 58.5 kg (129 lb)        Usual body weight: patient states that she was around 140 lb when on TPN, but that she was told that it was probably fluid weight (states she had pitting edema); reports that she was about 133 lb when she left the hospital and that she is about 127 lb now (states that she does not observe any swelling now); states that she likes to stay around 120 lb range in general       Ideal Body Weight: 61.27 kg  Weight in (lb) to have BMI = 25: 159.3    Nutrition Risk Screening:   Food Insecurity: No Food Insecurity (09/26/2022)    Hunger Vital Sign     Worried About Running Out of Food in the Last Year: Never true     Ran Out of Food in the Last Year: Never true        Nutrition Focused Physical Exam:    Nutrition Evaluation  Overall Impressions: Unable to perform Nutrition-Focused Physical Exam at this time due to (comment) (video visit) (02/26/23 1437)  Nutrition Designation: Normal weight (BMI 18.50 - 24.99 kg/m2) (02/26/23 1437)     Malnutrition Screening:   Unable to complete Malnutrition Assessment at this time due to (comment) (NFPE limited by video visit) (02/26/23 1437)    Biochemical Data, Medical Tests and Procedures:  All pertinent labs and imaging reviewed by Helen Hashimoto, RD/LDN at 2:47 PM 02/26/2023.    01/20/22- upper endoscopy  Impression:            - No evidence of blood or source of hematochezia seen                          on upper endoscopy- Normal esophagus.                         - Multiple gastric polyps. Polyps have previously been                          biopsied and  consistent with adenomas                         - Normal examined duodenum.                         - Normal examined jejunum.                         - No specimens collected.     08/05/22- pouch exam  Impression:            - The examined portion of the ileum was normal.                         - The ileoanal pouch is overall normal.                         - Rectal cuff with healthy appearing mucosa seen.                          Treated with argon beam coagulation.     11/11/22- CT pelvis w contrast  Impression:   Sequelae of proctocolectomy with ileoanal anastomosis with redemonstrated moderate dilatation of the proximal aspect of the ileoanal anastomosis, which appears less prominent compared to the prior study.       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       Redemonstrated ill-defined soft tissue mass within the lower central mesentery with associated tethering to adjacent small bowel loops, compatible with known desmoid tumor.       Distended urinary bladder, which is otherwise unremarkable in appearance. Correlate clinically for signs of urinary retention.      11/24/22- SLP documentation:   Recommendations: PO Diet, Consider alternative nutrition  Diet Liquids Recommendations: Thin Liquids, Level 0, No Restrictions  Diet Solids Recommendation: Regular Consistency Solids  Recommended Form of Medications: Whole, With puree (per pt preference)  Recommended Compensatory Techniques : Slow rate, Small sips/bites, Upright 90 degrees, HOB>30 degrees at all times, Monitor signs of aspiration, Alternate liquids and solids, Feed only when alert/awake, Endurance may be limited: give small meals and snacks, Upright 30 min after meal  Risk for Aspiration: Mild     12/12/22- DG Naso G Tube Plc W/FI W/Rad  IMPRESSION:   Successful repositioning of nasoenteric tube to the duodenum.   Okay for postpyloric use.      10/19/22- zinc 55 mcg/dl, copper WNL, vitamin E WNL, 25OH vitamin D WNL, folate WNL, vitamin B6 WNL, niacin WNL, vitamin B2 WNL, vitamin B1 298 nmol/L (high)  12/04/22- ferritin WNL, iron 42 micrograms/dl, TIBC 161.0 micrograms/dl, transferrin 960 mg/dl  4/54/09- phosphorous WNL     01/14/23-  25OH vitamin D, iron 23 micrograms/dl, iron saturation 8%, ferritin WNL, CRP 16 mg/L     01/23/23- magnesium WNL    02/24/23- sodium WNL, potassium WNL, eGFR WNL, BG WNL    Medications and Vitamin/Mineral Supplementation:   All nutritionally pertinent medications reviewed on 02/26/2023.     Nutritionally pertinent medications include: cholecalciferol, Ogsiveo, Pepcid, Remeron, Movantik, oxycodone, scopolamine, Carafate     She is not currently taking nutrition supplements. Patient states that she is supposed to be taking a multivitamin but didn't take it the first week she was  home and forgot to start it after that. Notes that she is not currently taking vitamin D listed in chart. 01/14/23 update: patient reports that she is not taking vitamins, as multivitamin makes her feel sick when she tries to swallow it. 02/09/23 update: patient reports that she hasn't taken a multivitamin yet and forgot about it. Thinks her mom may have bought some chewable ones, but not sure. 02/26/23: reports that she has not been taking vitamins, but will try to get back to that.     Current Outpatient Medications   Medication Sig Dispense Refill    acetaminophen (TYLENOL) 500 MG tablet Take 2 tablets (1,000 mg total) by mouth every eight (8) hours.      buprenorphine (BUTRANS) 20 mcg/hour PTWK transdermal patch Place 1 patch on the skin every seven (7) days. 4 patch 1    cholecalciferol, vitamin D3 25 mcg, 1,000 units,, 1,000 unit (25 mcg) tablet Take 1 tablet (25 mcg total) by mouth daily. 100 tablet 3    COURIERED MED OR SUPPLY OGSVEO 50 MG TABLET. 1 each 0    escitalopram oxalate (LEXAPRO) 5 MG tablet TAKE 1 TABLET (5 MG TOTAL) BY MOUTH DAILY. 90 tablet 1    famotidine (PEPCID) 20 MG tablet Take 1 tablet (20 mg total) by mouth nightly. 30 tablet 3    fluticasone propionate (FLONASE) 50 mcg/actuation nasal spray 1 spray into each nostril daily. 16 g 2    hydrocortisone 2.5 % cream Apply topically two (2) times a day as needed (hemorrhoids). 30 g 0    lidocaine (ASPERCREME) 4 % patch Place 2 patches on the skin daily. 60 patch 0    LORazepam (ATIVAN) 0.5 MG tablet Take 2 tablets (1 mg total) by mouth Three (3) times a day as needed for anxiety.      mirtazapine (REMERON) 15 MG tablet Take 1 tablet (15 mg total) by mouth nightly. 90 tablet 3    naloxegol (MOVANTIK) 12.5 mg tablet Take 1 tablet (12.5 mg total) by mouth daily. 30 tablet 0    naloxone (NARCAN) 4 mg nasal spray One spray in either nostril once for known/suspected opioid overdose. May repeat every 2-3 minutes in alternating nostril til EMS arrives 2 each 0    nirogacestat (OGSIVEO) 50 mg tablet Take 3 tablets (150 mg total) by mouth two (2) times a day. Swallow tablets whole; do not break, crush, or chew. 180 tablet 5    oxyCODONE (ROXICODONE) 10 mg immediate release tablet Please take 1 tab every 4 to 6 hours as needed for pain. Fill on or after: 02/25/23 120 tablet 0    pimecrolimus (ELIDEL) 1 % cream Apply topically two (2) times a day as needed. To face as needed for rash      scopolamine (TRANSDERM-SCOP) 1 mg over 3 days Place 1 patch (1 mg total) on the skin every third day. 10 patch 2    sucralfate (CARAFATE) 100 mg/mL suspension Take 10 mL (1 g total) by mouth four (4) times a day. 1200 mL 11    triamcinolone (KENALOG) 0.1 % cream Apply topically two (2) times a day as needed. Apply to rash as needed      zinc oxide-cod liver oil (DESITIN 40%) 40 % Pste Apply topically daily as needed. 57 g 0     No current facility-administered medications for this visit.       Nutrition History:     Dietary Restrictions: patient states that she avoided corn, popcorn, nuts and generally  limits high fiber foods, as she was told to avoid these when she had temporary ileostomy and never heard different for J pouch. Patient notes that every time she eats chicken it makes her feel nauseated and sends her GI system into pain. States that she avoids bacon, because it makes her whole GI system itch. States that lettuce gives her GI bleeds, so avoids salads and will only eat lettuce in small amounts.       Gastrointestinal Issues: on 02/26/23, patient states that she has experienced nausea, but has found that this is sometimes relieved by eating something; felt like she was going to vomit a few times; states that she has had loose stools with abdominal pain and bleeding recently that she states she told her oncologist about on Tuesday     Hunger and Satiety: on 02/26/23, patient states that she has been feeling hungry lately; also notes less stress lately with classes over    Food Safety and Access: She did not report issues.     Diet Recall:   Time Intake- Tuesday 02/24/23 per patient report    Breakfast 6 oz Lactaid milk  A little over half of a [employer] chicken biscuit with bun and American cheese, 1-2 hash brown bites    Snack (AM)    Lunch Drank a large sweet tea over the course of the day    Snack (PM)    Dinner Big chicken finger from [employer], few spoons of slaw, handful of fries, 16 oz tea    Snack (HS) Handful of Cheetos, 16 oz Lactaid milk      Food-Related History:    Additional PO Intake Updates (02/26/23):   Patient reports that she has been keeping track of her intakes since removing feeding tube. Other foods she has eaten recenty include tortilla chips with cheese dip, fruit cup, 1/2 grilled chicken croissant with honey mustard, lettuce, and tomato, scrambled egg, fairlife shake, french toast squares, grilled chicken with squash and zucchini. She states that her mom has made her some smoothies using greek yogurt, strawberry, banana. Patient states that her portions are about the size of a kid's meal, which is close to her baseline, and adds that she has to get used to PO intake without nutrition support after about a year of EN and/or TPN support.     Additional EN Intake Updates (02/26/23):   Patient reports that she was cleared to pull feeding tube out at  home and did so on 02/19/23 when tube got clogged. She states that she had been doing extra feeds prior to pulling tube. Found that there was something black at the bottom of the tube when she pulled it, and she is not sure if it was blood. Appears per documentation that oncology addressing GI bleeding. States she is still getting used to not having feeding tube in her nose and having to adjust/check it.        Eating Behaviors:  Not addressed this visit.     Physical Activity:  not addressed this visit     Daily Estimated Nutritional Needs:  Energy: ~1610-9604 kcals [25-30 kcal/kg using last recorded weight, 58.5 kg (01/01/23 1530)]  Protein: ~70-88 gm [1.2-1.5 gm/kg using last recorded weight, 58.5 kg (01/01/23 1530)]  Carbohydrate:   [no restriction]  Fluid:   [per MD team]    Nutrition Goals & Evaluation      Patient will meet estimated nutritional needs via PO intakes, with goal of discontinuing EN per discussions  with GI team  (Progressing- feeding tube now removed as of 02/19/23)  Patient will prevent unintentional loss of dry weight  (Ongoing)  Patient will implement low fiber nutrition therapy principles as tolerated  (Progressing)  Patient will implement high calorie/high protein PO nutrition strategies as tolerated  (Ongoing)  Patient will keep record of PO intake to better quantify progress  (Progressing)     Nutrition goals reviewed, relevant barriers identified and addressed: none evident. She is evaluated to have good to fair willingness and ability to achieve nutrition goals.     Nutrition Assessment       Patient seen via video on 02/26/23. NFPE limited by nature of video visit, but patient states that her weight has been trending down. She reports that she self-removed feeding tube on 02/19/23 (due to clog) and that she has been maintaining her intakes since that time. Still not taking multivitamin as previously recommended, but states that she is hoping to restart this. Addressed questions and discussed integration of tips for increasing calories as tolerated, particularly now that patient does not have nutrition support for the first time in a year per report. Encouraged patient to reach out as needed between visits for additional questions/concerns.     Nutrition Intervention      - Nutrition Counseling: Goal setting Problem solving  - Care Coordination: Collaboration with other provider(s): Drs. Herfarth and [name] copied on close of encounter     Nutrition Plan:   Continue to increase oral intakes as tolerated. Review handouts on low fiber diet, tips for increasing calories, and tips for adding protein sent in MyChart first visit and implement suggestions per individual tolerance. Generally would aim for small, frequent intakes throughout the day. Consider setting an alarm or timer if this helps remind you to eat at more routine intervals. High calorie/high protein liquids are likely to be an efficient way to add nutrition between meals.   Consider eating food sources of soluble fiber (e.g. apples, barley, beans, carrots, citrus, oats, peas) as tolerated in small amounts to see if this helps reduce wateriness of stool, given Banatrol unsuccessful.   If the multivitamin you have is not well tolerated, consider chewable vitamin (e.g. Flintstones) as tolerated instead.This is particularly important now that you are not receiving nutrition support.     Follow up will occur on March 20, 2023 via video.       Food/Nutrition-related history, Anthropometric measurements, Biochemical data, medical tests, procedures, Nutrition-focused physical findings, Patient understanding or compliance with intervention and recommendations , and Effectiveness of nutrition interventions will be assessed at time of follow-up.       Recommendations for Care Team :  No additional at present time.      Patient referred to outpatient nutrition services as part of therapy/treatment plans.      Time spent 30 minutes     The patient reports they are physically located in [place] and is currently: at home. I conducted a audio/video visit. I spent  73m 41s on the video call with the patient. I spent an additional 60 minutes on pre- and post-visit activities on the date of service .         I am located on-site and the patient is located off-site for this visit.

## 2023-02-26 NOTE — Unmapped (Signed)
[facility] Scheduling Call     Hello,    We received a call regarding patient [name] [name]. Caller is requesting the following return appointments: return appointment.    Stated that they were told they could change their 1/7 return appointment to the 1/3.     Unable to schedule because no slots are available.Please assists at [phone].      Thank you,   Sharl Ma  [facility]  [phone]

## 2023-02-27 DIAGNOSIS — M6281 Muscle weakness (generalized): Secondary | ICD-10-CM | POA: Diagnosis not present

## 2023-03-02 DIAGNOSIS — D48119 Desmoid tumor: Principal | ICD-10-CM

## 2023-03-02 DIAGNOSIS — M6281 Muscle weakness (generalized): Secondary | ICD-10-CM | POA: Diagnosis not present

## 2023-03-02 NOTE — Unmapped (Signed)
[facility] Refill Coordination Note    Specialty Medication(s) to be Shipped:   Hematology/Oncology: [name] [name] 50mg     Other medication(s) to be shipped: No additional medications requested for fill at this time     [name] [name], DOB: 2000/05/16  Phone: [phone] (home) [phone] (work)      All above HIPAA information was verified with patient's family member, Mom.     Was a Nurse, learning disability used for this call? No    Completed refill call assessment today to schedule patient's medication shipment from the [facility]  [phone]).  All relevant notes have been reviewed.     Specialty medication(s) and dose(s) confirmed: Regimen is correct and unchanged.   Changes to medications: [name] [name] reports starting the following medications: Buprenorphine patch and stopped suboxone  Changes to insurance: No  New side effects reported not previously addressed with a pharmacist or physician: None reported  Questions for the pharmacist: No    Confirmed patient received a Conservation officer, historic buildings and a Surveyor, mining with first shipment. The patient will receive a drug information handout for each medication shipped and additional FDA Medication Guides as required.       DISEASE/MEDICATION-SPECIFIC INFORMATION        N/A    SPECIALTY MEDICATION ADHERENCE     Medication Adherence    Patient reported X missed doses in the last month: 6  Specialty Medication: [name] [name] 50mg   Patient is on additional specialty medications: No  Informant: mother     Were doses missed due to medication being on hold? No    [name] [name] 50 mg: 14 days of medicine on hand       REFERRAL TO PHARMACIST     Referral to the pharmacist: Not needed      [facility]     Shipping address confirmed in Epic.       Delivery Scheduled: Yes, Expected medication delivery date: 03/10/23.     Medication will be delivered via UPS to the prescription address in Epic [place].    [name] [name]   [name]  Specialty Technician

## 2023-03-06 DIAGNOSIS — M6281 Muscle weakness (generalized): Secondary | ICD-10-CM | POA: Diagnosis not present

## 2023-03-08 ENCOUNTER — Encounter
Admit: 2023-03-08 | Discharge: 2023-03-20 | Payer: BLUE CROSS/BLUE SHIELD | Attending: Student in an Organized Health Care Education/Training Program

## 2023-03-08 ENCOUNTER — Ambulatory Visit: Admit: 2023-03-08 | Payer: BLUE CROSS/BLUE SHIELD

## 2023-03-08 ENCOUNTER — Encounter: Admit: 2023-03-08 | Payer: BLUE CROSS/BLUE SHIELD

## 2023-03-08 ENCOUNTER — Inpatient Hospital Stay: Admit: 2023-03-08 | Discharge: 2023-03-08 | Payer: BLUE CROSS/BLUE SHIELD

## 2023-03-08 ENCOUNTER — Encounter: Admit: 2023-03-08 | Payer: BLUE CROSS/BLUE SHIELD | Attending: Emergency Medicine

## 2023-03-08 ENCOUNTER — Ambulatory Visit: Admit: 2023-03-08 | Discharge: 2023-03-20 | Payer: BLUE CROSS/BLUE SHIELD

## 2023-03-08 ENCOUNTER — Encounter
Admit: 2023-03-08 | Payer: BLUE CROSS/BLUE SHIELD | Attending: Student in an Organized Health Care Education/Training Program

## 2023-03-08 ENCOUNTER — Inpatient Hospital Stay
Admit: 2023-03-08 | Discharge: 2023-03-20 | Disposition: A | Discharge status: Home Health | Payer: BLUE CROSS/BLUE SHIELD | Admitting: Student in an Organized Health Care Education/Training Program

## 2023-03-08 DIAGNOSIS — D48119 Desmoid tumor of unspecified site: Secondary | ICD-10-CM | POA: Diagnosis not present

## 2023-03-08 DIAGNOSIS — R109 Unspecified abdominal pain: Secondary | ICD-10-CM | POA: Diagnosis not present

## 2023-03-08 LAB — URINALYSIS WITH MICROSCOPY WITH CULTURE REFLEX PERFORMABLE
BACTERIA: NONE SEEN /HPF
BILIRUBIN UA: NEGATIVE
KETONES UA: NEGATIVE
LEUKOCYTE ESTERASE UA: NEGATIVE
NITRITE UA: NEGATIVE
PH UA: 6.5 (ref 5.0–9.0)
RBC UA: 4 /HPF (ref <=4)
SPECIFIC GRAVITY UA: 1.025 (ref 1.003–1.030)
SQUAMOUS EPITHELIAL: 3 /HPF (ref 0–5)
UROBILINOGEN UA: <2
WBC UA: <1 /HPF (ref 0–5)

## 2023-03-08 LAB — T4, FREE: FREE T4: 1.29 ng/dL (ref 0.89–1.76)

## 2023-03-08 LAB — CBC W/ AUTO DIFF
BASOPHILS ABSOLUTE COUNT: 0 10*9/L (ref 0.0–0.1)
BASOPHILS RELATIVE PERCENT: 0.4 %
EOSINOPHILS ABSOLUTE COUNT: 0.1 10*9/L (ref 0.0–0.5)
EOSINOPHILS RELATIVE PERCENT: 0.8 %
HEMATOCRIT: 46.3 % — ABNORMAL HIGH (ref 34.0–44.0)
HEMOGLOBIN: 15.5 g/dL — ABNORMAL HIGH (ref 11.3–14.9)
LYMPHOCYTES ABSOLUTE COUNT: 1.8 10*9/L (ref 1.1–3.6)
LYMPHOCYTES RELATIVE PERCENT: 17.2 %
MEAN CORPUSCULAR HEMOGLOBIN CONC: 33.4 g/dL (ref 32.0–36.0)
MEAN CORPUSCULAR HEMOGLOBIN: 26.3 pg (ref 25.9–32.4)
MEAN CORPUSCULAR VOLUME: 78.8 fL (ref 77.6–95.7)
MEAN PLATELET VOLUME: 11 fL — ABNORMAL HIGH (ref 6.8–10.7)
MONOCYTES ABSOLUTE COUNT: 0.6 10*9/L (ref 0.3–0.8)
MONOCYTES RELATIVE PERCENT: 6.2 %
NEUTROPHILS ABSOLUTE COUNT: 7.7 10*9/L (ref 1.8–7.8)
NEUTROPHILS RELATIVE PERCENT: 75.4 %
PLATELET COUNT: 239 10*9/L (ref 150–450)
RED BLOOD CELL COUNT: 5.87 10*12/L — ABNORMAL HIGH (ref 3.95–5.13)
RED CELL DISTRIBUTION WIDTH: 14.9 % (ref 12.2–15.2)
WBC ADJUSTED: 10.2 10*9/L (ref 3.6–11.2)

## 2023-03-08 LAB — MAGNESIUM: MAGNESIUM: 2.4 mg/dL (ref 1.6–2.6)

## 2023-03-08 LAB — COMPREHENSIVE METABOLIC PANEL
ALBUMIN: 4.7 g/dL (ref 3.4–5.0)
ALKALINE PHOSPHATASE: 103 U/L (ref 46–116)
ALT (SGPT): 22 U/L (ref 10–49)
ANION GAP: 11 mmol/L (ref 5–14)
AST (SGOT): 30 U/L (ref <=34)
BILIRUBIN TOTAL: 0.5 mg/dL (ref 0.3–1.2)
BLOOD UREA NITROGEN: 8 mg/dL — ABNORMAL LOW (ref 9–23)
BUN / CREAT RATIO: 16
CALCIUM: 10.8 mg/dL — ABNORMAL HIGH (ref 8.7–10.4)
CHLORIDE: 101 mmol/L (ref 98–107)
CO2: 27 mmol/L (ref 20.0–31.0)
CREATININE: 0.5 mg/dL — ABNORMAL LOW (ref 0.55–1.02)
EGFR CKD-EPI (2021) FEMALE: >90 mL/min/{1.73_m2} (ref >=60)
GLUCOSE RANDOM: 109 mg/dL (ref 70–179)
POTASSIUM: 3.7 mmol/L (ref 3.4–4.8)
PROTEIN TOTAL: 9 g/dL — ABNORMAL HIGH (ref 5.7–8.2)
SODIUM: 139 mmol/L (ref 135–145)

## 2023-03-08 LAB — LIPASE: LIPASE: 47 U/L (ref 12–53)

## 2023-03-08 LAB — PHOSPHORUS: PHOSPHORUS: 3.2 mg/dL (ref 2.4–5.1)

## 2023-03-08 LAB — PROTIME-INR
INR: 1.15
PROTIME: 13.1 s — ABNORMAL HIGH (ref 9.9–12.6)

## 2023-03-08 LAB — APTT
APTT: 44.4 s — ABNORMAL HIGH (ref 24.8–38.4)
HEPARIN CORRELATION: 0.3

## 2023-03-08 LAB — HCG QUANTITATIVE, BLOOD: GONADOTROPIN, CHORIONIC (HCG) QUANT: <2.6 m[IU]/mL

## 2023-03-08 LAB — TSH: THYROID STIMULATING HORMONE: 0.513 u[IU]/mL — ABNORMAL LOW (ref 0.550–4.780)

## 2023-03-08 MED ADMIN — sodium chloride 0.9% (NS) bolus 1,000 mL: 1000 mL | INTRAVENOUS | @ 19:00:00 | Stop: 2023-03-08

## 2023-03-08 MED ADMIN — gadopiclenol (ELUCIREM,VUEWAY) injection 5.41 mL: .1 mL/kg | INTRAVENOUS | @ 16:00:00 | Stop: 2023-03-08

## 2023-03-08 MED ADMIN — HYDROmorphone (PF) (DILAUDID) injection 1 mg: 1 mg | INTRAVENOUS | Stop: 2023-03-09

## 2023-03-08 MED ADMIN — HYDROmorphone (PF) (DILAUDID) injection 1 mg: 1 mg | INTRAVENOUS | @ 21:00:00 | Stop: 2023-03-09

## 2023-03-08 MED ADMIN — promethazine (PHENERGAN) 12.5 mg in sodium chloride (NS) 0.9 % 50 mL IVPB: 12.5 mg | INTRAVENOUS | @ 19:00:00 | Stop: 2023-03-08

## 2023-03-08 MED ADMIN — HYDROmorphone (PF) (DILAUDID) injection 1 mg: 1 mg | INTRAVENOUS | @ 19:00:00 | Stop: 2023-03-09

## 2023-03-08 NOTE — Unmapped (Signed)
Onc patient with weight loss and gi bleeding, dizziness

## 2023-03-08 NOTE — Unmapped (Signed)
Regarding: severe pain, feels like passing out. Had an MRI today  ----- Message from Telford B sent at 03/08/2023 11:50 AM EST -----  Copied From CRM #[id number].[facility] Triage  If you had not called [facility], what do you think you would have done?   Go to Emergency Dept

## 2023-03-08 NOTE — Unmapped (Signed)
[facility]  Emergency Department Provider Note     ED Clinical Impression     Final diagnoses:   Cancer associated pain (Primary)   Hematochezia      Impression, Medical Decision Making, ED Course     Impression: 22 y.o. female who has a past medical history of Abdominal pain, Acid reflux, Anesthesia complication, Cancer (CMS-HCC), Cataract of right eye, COVID-19 virus infection (01/2019), Cyst of thyroid determined by ultrasound, [name], Difficult intravenous access, [name] (familial adenomatous polyposis), [name], Gastric polyps, History of chemotherapy, History of colon polyps, History of COVID-19 (01/2019), Iron deficiency anemia due to chronic blood loss, PONV (postoperative nausea and vomiting), Rectal bleeding, and Syncopal episodes. who presents with refractory pain the patient's cancer additionally 2 to 3 days of worsening lightheadedness, fatigue, bruising as described below.     DDx/MDM: Differential for this patient consist of but is not limited to refractory pain secondary to patient's cancer, dehydration, GI bleed, severe anemia, infection, malnutrition, gastroenteritis or other infectious cause of diarrhea.  Patient's history, presentation, physical exam mostly diagnosis at this time is refractory pain secondary to patient's cancer and dehydration.  Patient presenting with orthostatic lightheadedness, overall fatigue, watery hematochezia/diarrhea for the past week.  Patient states that the blood loss in stool is not more than her baseline.  Patient notes that her current pain regimen has been meticulously thought out and planned IR team is no longer controlling her pain.  Will await MRI readings from patient's MRI earlier today, get basic workup consisting of coagulation panel, ABO type and screen, CBC, CMP, UA with culture reflex, TSH, lipase, mag and Phos.  He is on his will use in order to assess for any type of anemia, leukocytosis or underlying infection, electrolyte abnormalities given patient's history and weeklong of diarrhea.  Patient will be admitted for pain management at the least potentially further workup for patient's new onset watery diarrhea.    Diagnostic workup as below. Will treat patient with pain management, Phenergan for nausea, and IV fluids.    Orders Placed This Encounter   Procedures    CBC w/ Differential    Comprehensive Metabolic Panel    hCG QUANTitative, Blood    Lipase    Urinalysis with Microscopy with Culture Reflex    PT-INR    PTT    TSH    Magnesium    Phosphorus    T4, free    Monitor    Oxygen sat - continuous monitoring    Orthostatic blood pressure    Type and Screen with Confirmation ABORh    ED Admit Decision       ED Course as of 03/08/23 1640   Sun Mar 08, 2023   1640 MRI abdomen pelvis  -Overall slightly increased size of the ill-defined enhancing soft tissue in the central lower mesentery extending into the pelvis/presacral region with associated small bowel tethering corresponding to desmoid tumor.    --Sequela of proctocolectomy and ileoanal anastomosis with grossly similar dilation of bowel proximal to the anastomosis.    --Three enhancing soft tissue lesions in the right rectus musculature, the largest of which is smaller in size compared to 11/11/2022.    --Slightly increased size of multiple prominent central mesenteric lymph nodes measuring up to 1.0 cm which may be reactive.    --Increased size of a nonenhancing 1.9 cm left interpolar parapelvic cyst with layering internal hemorrhagic debris.    --Marked narrowing of the left renal vein as it crosses between the  SMA and aorta, correlate clinically for signs of nutcracker syndrome.    --Findings suggestive of iron deposition within the liver.    -- Numerous cystic foci along the pelvic wall without definite enhancement as described in the body of the report, correlate with physical examination.    --Of note, the patient was unable to tolerate additional imaging of the chest and lower extremities at this time.       MDM Elements  Discussion of Management with other Physicians, QHP or Appropriate Source: Admitting team - patient paged to hospitalist  Independent Interpretation of Studies: See above  I have reviewed recent and relevant previous record, including: Inpatient notes -   and Outpatient notes -    Escalation of Care including OBS/Admission/Transfer was considered: Patient will be admitted        ____________________________________________    The case was discussed with the attending physician, who is in agreement with the above assessment and plan.      History     Chief Complaint  Chief Complaint   Patient presents with    Pain    Abdominal Pain       HPI   [name] [name] is a 22 y.o. female with past medical history as below who presents with worsening cancer related pains.  Patient states over the past week her cancer-related pain to become worse and are not responding to her pain management regimen at home.  Patient was scheduled for MRIs today for further assessment of her tumors as well as her spine however patient was hesitant to even come in due to her pain at home and was only able to stand MRI of abdomen pelvis I cannot continue with any further scanning.  Patient was recommended to present to the emergency department possible admission for pain management.  Additionally, over the past 2 to 3 days patient states that she has been having terrible lightheadedness specifically with standing from seated position and has not felt this before.  Patient has been weak from her hospitalization as well as malnourished and finds it difficult to get around anyway but is found it more difficult with this and lightheadedness making her feel that she is can pass out.  Patient denies any lightheadedness while in the seated position without exertion.  She also notes random bruising is occurred as well as recent epistaxis and bleeding from ears which never happens patient when if this is due to her anemia.  Bleeding self resolved.  Of note, patient still has hematochezia from potential polyps, rectal pouch ulcer and notes that the amount of blood in stool is not any different than her baseline but wonders if that is adding to her lightheadedness.  Patient notes that she has had watery diarrhea since getting her PEG tube removed and feels like she is not absorbing any nutrients.  Patient was still lost 5 pounds in the last 1 to 2 weeks even though she maintains an appetite and is eating a lot.  Patient denies any fever, vomiting, hearing or vision changes, chest pain apart from her baseline, shortness of breath, urinary symptoms.    Outside Historian(s): I have obtained additional history/collateral from patient's parents at bedside.    Past Medical History:   Diagnosis Date    Abdominal pain     Acid reflux     occas    Anesthesia complication     itching, shaking, coldness; last few surgeries have gone much better    Cancer (CMS-HCC)  Cataract of right eye     COVID-19 virus infection 01/2019    Cyst of thyroid determined by ultrasound     monitoring    [name]     2 right forearm, 1 left thigh, 1 right scapula, 1 under left clavicle; multiple    Difficult intravenous access     [name] (familial adenomatous polyposis)     [name]     Gastric polyps     History of chemotherapy     last treatment approx 05/2019    History of colon polyps     History of COVID-19 01/2019    Iron deficiency anemia due to chronic blood loss     received iron infusion 11-2019    PONV (postoperative nausea and vomiting)     Rectal bleeding     Syncopal episodes     especially if becoming dehydrated       Past Surgical History:   Procedure Laterality Date    COLON SURGERY      cyroablation      cystis removal      desmoid removal      PR CLOSE ENTEROSTOMY,RESEC+ANAST N/A 10/09/2020    Procedure: ILEOSTOMY TAKEDOWN;  Surgeon: Mickle Asper, MD;  Location: OR [place];  Service: General Surgery PR COLONOSCOPY W/BIOPSY SINGLE/MULTIPLE N/A 10/27/2012    Procedure: COLONOSCOPY, FLEXIBLE, PROXIMAL TO SPLENIC FLEXURE; WITH BIOPSY, SINGLE OR MULTIPLE;  Surgeon: Shirlyn Goltz Mir, MD;  Location: PEDS PROCEDURE ROOM [place];  Service: Gastroenterology    PR COLONOSCOPY W/BIOPSY SINGLE/MULTIPLE N/A 09/14/2013    Procedure: COLONOSCOPY, FLEXIBLE, PROXIMAL TO SPLENIC FLEXURE; WITH BIOPSY, SINGLE OR MULTIPLE;  Surgeon: Shirlyn Goltz Mir, MD;  Location: PEDS PROCEDURE ROOM [place];  Service: Gastroenterology    PR COLONOSCOPY W/BIOPSY SINGLE/MULTIPLE N/A 11/08/2014    Procedure: COLONOSCOPY, FLEXIBLE, PROXIMAL TO SPLENIC FLEXURE; WITH BIOPSY, SINGLE OR MULTIPLE;  Surgeon: Arnold Long Mir, MD;  Location: PEDS PROCEDURE ROOM [facility];  Service: Gastroenterology    PR COLONOSCOPY W/BIOPSY SINGLE/MULTIPLE N/A 12/26/2015    Procedure: COLONOSCOPY, FLEXIBLE, PROXIMAL TO SPLENIC FLEXURE; WITH BIOPSY, SINGLE OR MULTIPLE;  Surgeon: Arnold Long Mir, MD;  Location: PEDS PROCEDURE ROOM [facility];  Service: Gastroenterology    PR COLONOSCOPY W/BIOPSY SINGLE/MULTIPLE N/A 09/02/2017    Procedure: COLONOSCOPY, FLEXIBLE, PROXIMAL TO SPLENIC FLEXURE; WITH BIOPSY, SINGLE OR MULTIPLE;  Surgeon: Arnold Long Mir, MD;  Location: PEDS PROCEDURE ROOM [place];  Service: Gastroenterology    PR COLSC FLX W/REMOVAL LESION BY HOT BX FORCEPS N/A 08/27/2016    Procedure: COLONOSCOPY, FLEXIBLE, PROXIMAL TO SPLENIC FLEXURE; W/REMOVAL TUMOR/POLYP/OTHER LESION, HOT BX FORCEP/CAUTE;  Surgeon: Arnold Long Mir, MD;  Location: PEDS PROCEDURE ROOM [facility];  Service: Gastroenterology    PR COLSC FLX W/RMVL OF TUMOR POLYP LESION SNARE TQ N/A 02/25/2019    Procedure: COLONOSCOPY FLEX; W/REMOV TUMOR/LES BY SNARE;  Surgeon: Helyn Numbers, MD;  Location: GI PROCEDURES [place] [facility];  Service: Gastroenterology    PR COLSC FLX W/RMVL OF TUMOR POLYP LESION SNARE TQ N/A 03/13/2020    Procedure: COLONOSCOPY FLEX; W/REMOV TUMOR/LES BY SNARE;  Surgeon: Helyn Numbers, MD; Location: GI PROCEDURES [place] [facility];  Service: Gastroenterology    PR EXC SKIN BENIG 2.1-3 CM TRUNK,ARM,LEG Right 02/25/2017    Procedure: EXCISION, BENIGN LESION INCLUDE MARGINS, EXCEPT SKIN TAG, LEGS; EXCISED DIAMETER 2.1 TO 3.0 CM;  Surgeon: Clarene Duke, MD;  Location: CHILDRENS OR [facility];  Service: Plastics    PR EXC SKIN BENIG 3.1-4 CM TRUNK,ARM,LEG Right 02/25/2017    Procedure: EXCISION, BENIGN  LESION INCLUDE MARGINS, EXCEPT SKIN TAG, ARMS; EXCISED DIAMETER 3.1 TO 4.0 CM;  Surgeon: Clarene Duke, MD;  Location: CHILDRENS OR [facility];  Service: Plastics    PR EXC SKIN BENIG >4 CM FACE,FACIAL Right 02/25/2017    Procedure: EXCISION, OTHER BENIGN LES INCLUD MARGIN, FACE/EARS/EYELIDS/NOSE/LIPS/MUCOUS MEMBRANE; EXCISED DIAM >4.0 CM;  Surgeon: Clarene Duke, MD;  Location: CHILDRENS OR [facility];  Service: Plastics    PR EXC TUMOR SOFT TISSUE LEG/ANKLE SUBQ 3+CM Right 08/05/2019    Procedure: EXCISION, TUMOR, SOFT TISSUE OF LEG OR ANKLE AREA, SUBCUTANEOUS; 3 CM OR GREATER;  Surgeon: Arsenio Katz, MD;  Location: MAIN OR [place];  Service: Plastics    PR EXC TUMOR SOFT TISSUE LEG/ANKLE SUBQ <3CM Right 08/05/2019    Procedure: EXCISION, TUMOR, SOFT TISSUE OF LEG OR ANKLE AREA, SUBCUTANEOUS; LESS THAN 3 CM;  Surgeon: Arsenio Katz, MD;  Location: MAIN OR [facility];  Service: Plastics    PR LAP, SURG PROCTECTOMY W J-POUCH N/A 08/10/2020    Procedure: ROBOTIC ASSISTED LAPAROSCOPIC PROCTOCOLECTOMY, ILEAL J POUCH, WITH OSTOMY;  Surgeon: Mickle Asper, MD;  Location: OR [place];  Service: General Surgery    PR NDSC EVAL INTSTINAL POUCH DX W/COLLJ SPEC SPX N/A 01/23/2021    Procedure: ENDO EVAL SM INTEST POUCH; DX;  Surgeon: Modena Nunnery, MD;  Location: [facility] PROCEDURES [place] [facility];  Service: Gastroenterology    PR NDSC EVAL INTSTINAL POUCH DX W/COLLJ SPEC SPX N/A 08/27/2021    Procedure: ENDO EVAL SM INTEST POUCH; DX;  Surgeon: Hunt Oris, MD;  [facility]: [facility]; Service: Gastroenterology    PR NDSC EVAL INTSTINAL POUCH DX W/COLLJ SPEC SPX N/A 12/09/2021    Procedure: ENDO EVAL SM INTEST POUCH; DX;  Surgeon: Vidal Schwalbe, MD;  Location: [facility]URES [facility];  Service: Gastroenterology    PR NDSC EVAL INTSTINAL POUCH DX W/COLLJ [facility] SPX Left 04/09/2022    Procedure: ENDO EVAL SM INTEST POUCH; DX;  Surgeon: Modena Nunnery, MD;  Location: [facility] PROCEDURES [place] [facility];  Service: Gastroenterology    PR NDSC EVAL INTSTINAL POUCH DX W/COLLJ SPEC SPX N/A 08/05/2022    Procedure: ENDO EVAL SM INTEST POUCH; DX;  Surgeon: Modena Nunnery, MD;  Location: GI PROCEDURES [facility];  Service: Gastroenterology    PR NDSC EVAL INTSTINAL POUCH W/BX SINGLE/MULTIPLE N/A 01/20/2022    Procedure: ENDOSCOPIC EVAL OF SMALL INTESTINAL POUCH; DIAGNOSTIC, No biopsies;  Surgeon: Andrey Farmer, MD;  Location: [facility]URES [facility];  Service: Gastroenterology    PR NDSC EVAL INTSTINAL POUCH W/BX SINGLE/MULTIPLE N/A 02/13/2022    Procedure: ENDOSCOPIC EVAL OF SMALL INTESTINAL POUCH; DIAGNOSTIC, WITH BIOPSY;  Surgeon: Bronson Curb, MD;  Location: [facility]URES [facility];  Service: Gastroenterology    PR UNLISTED PROCEDURE SMALL INTESTINE  01/23/2021    Procedure: UNLISTED PROCEDURE, SMALL INTESTINE;  Surgeon: Modena Nunnery, MD;  Location: [facility] PROCEDURES [place] [facility];  Service: Gastroenterology    PR UNLISTED PROCEDURE SMALL INTESTINE  02/13/2022    Procedure: UNLISTED PROCEDURE, SMALL INTESTINE;  Surgeon: Bronson Curb, MD;  [facility]: [facility]URES [facility];  Service: Gastroenterology    PR UPPER GI ENDOSCOPY,BIOPSY N/A 10/27/2012    Procedure: UGI ENDOSCOPY; WITH BIOPSY, SINGLE OR MULTIPLE;  Surgeon: Shirlyn Goltz Mir, MD;  Location: PEDS PROCEDURE ROOM [facility];  Service: Gastroenterology    PR UPPER GI ENDOSCOPY,BIOPSY N/A 09/14/2013    Procedure: UGI ENDOSCOPY; WITH BIOPSY, SINGLE OR MULTIPLE;  Surgeon: Shirlyn Goltz Mir, MD; Location: PEDS  PROCEDURE ROOM [facility];  Service: Gastroenterology    PR UPPER GI ENDOSCOPY,BIOPSY N/A 11/08/2014    Procedure: UGI ENDOSCOPY; WITH BIOPSY, SINGLE OR MULTIPLE;  Surgeon: Arnold Long Mir, MD;  Location: PEDS PROCEDURE ROOM [facility];  Service: Gastroenterology    PR UPPER GI ENDOSCOPY,BIOPSY N/A 12/26/2015    Procedure: UGI ENDOSCOPY; WITH BIOPSY, SINGLE OR MULTIPLE;  Surgeon: Arnold Long Mir, MD;  Location: PEDS PROCEDURE ROOM [facility];  Service: Gastroenterology    PR UPPER GI ENDOSCOPY,BIOPSY N/A 08/27/2016    Procedure: UGI ENDOSCOPY; WITH BIOPSY, SINGLE OR MULTIPLE;  Surgeon: Arnold Long Mir, MD;  Location: PEDS PROCEDURE ROOM [facility];  Service: Gastroenterology    PR UPPER GI ENDOSCOPY,BIOPSY N/A 09/02/2017    Procedure: UGI ENDOSCOPY; WITH BIOPSY, SINGLE OR MULTIPLE;  Surgeon: Arnold Long Mir, MD;  Location: PEDS PROCEDURE ROOM [facility];  Service: Gastroenterology    PR UPPER GI ENDOSCOPY,BIOPSY N/A 03/13/2020    Procedure: UGI ENDOSCOPY; WITH BIOPSY, SINGLE OR MULTIPLE;  Surgeon: Helyn Numbers, MD;  Location: GI PROCEDURES [place] [facility];  Service: Gastroenterology    PR UPPER GI ENDOSCOPY,BIOPSY N/A 09/05/2021    Procedure: UGI ENDOSCOPY; WITH BIOPSY, SINGLE OR MULTIPLE;  Surgeon: Wendall Papa, MD;  [facility]: [facility]URES [facility];  Service: Gastroenterology    PR UPPER GI ENDOSCOPY,DIAGNOSIS N/A 01/20/2022    Procedure: UGI ENDO, INCLUDE ESOPHAGUS, STOMACH, & DUODENUM &/OR JEJUNUM; DX W/WO COLLECTION SPECIMN, BY BRUSH OR WASH;  Surgeon: Andrey Farmer, MD;  [facility]: GI PROCEDURES [facility];  Service: Gastroenterology    TUMOR REMOVAL      multiple-head, neck, back, hand, right flank, multiple         Current Facility-Administered Medications:     HYDROmorphone (PF) (DILAUDID) injection 1 mg, 1 mg, Intravenous, Q2H PRN, Ettamae Barkett, MD, 1 mg at 03/08/23 1628    Current Outpatient Medications:     acetaminophen (TYLENOL) 500 MG tablet, Take 2 tablets (1,000 mg total) by mouth every eight (8) hours., Disp: , Rfl:     buprenorphine (BUTRANS) 20 mcg/hour PTWK transdermal patch, Place 1 patch on the skin every seven (7) days., Disp: 4 patch, Rfl: 1    cholecalciferol, vitamin D3 25 mcg, 1,000 units,, 1,000 unit (25 mcg) tablet, Take 1 tablet (25 mcg total) by mouth daily., Disp: 100 tablet, Rfl: 3    COURIERED MED OR SUPPLY, OGSVEO 50 MG TABLET., Disp: 1 each, Rfl: 0    escitalopram oxalate (LEXAPRO) 5 MG tablet, TAKE 1 TABLET (5 MG TOTAL) BY MOUTH DAILY., Disp: 90 tablet, Rfl: 1    famotidine (PEPCID) 20 MG tablet, Take 1 tablet (20 mg total) by mouth nightly., Disp: 30 tablet, Rfl: 3    fluticasone propionate (FLONASE) 50 mcg/actuation nasal spray, 1 spray into each nostril daily., Disp: 16 g, Rfl: 2    hydrocortisone 2.5 % cream, Apply topically two (2) times a day as needed (hemorrhoids)., Disp: 30 g, Rfl: 0    lidocaine (ASPERCREME) 4 % patch, Place 2 patches on the skin daily., Disp: 60 patch, Rfl: 0    LORazepam (ATIVAN) 0.5 MG tablet, Take 2 tablets (1 mg total) by mouth Three (3) times a day as needed for anxiety., Disp: , Rfl:     mirtazapine (REMERON) 15 MG tablet, Take 1 tablet (15 mg total) by mouth nightly., Disp: 90 tablet, Rfl: 3    naloxegol (MOVANTIK) 12.5 mg tablet, Take 1 tablet (12.5 mg total) by mouth daily., Disp: 30 tablet, Rfl: 0    naloxone (NARCAN) 4 mg  nasal spray, One spray in either nostril once for known/suspected opioid overdose. May repeat every 2-3 minutes in alternating nostril til EMS arrives, Disp: 2 each, Rfl: 0    nirogacestat (OGSIVEO) 50 mg tablet, Take 3 tablets (150 mg total) by mouth two (2) times a day. Swallow tablets whole; do not break, crush, or chew., Disp: 180 tablet, Rfl: 5    oxyCODONE (ROXICODONE) 10 mg immediate release tablet, Please take 1 tab every 4 to 6 hours as needed for pain. Fill on or after: 02/25/23, Disp: 120 tablet, Rfl: 0    pimecrolimus (ELIDEL) 1 % cream, Apply topically two (2) times a day as needed. To face as needed for rash, Disp: , Rfl:     scopolamine (TRANSDERM-SCOP) 1 mg over 3 days, Place 1 patch (1 mg total) on the skin every third day., Disp: 10 patch, Rfl: 2    sucralfate (CARAFATE) 100 mg/mL suspension, Take 10 mL (1 g total) by mouth four (4) times a day., Disp: 1200 mL, Rfl: 11    triamcinolone (KENALOG) 0.1 % cream, Apply topically two (2) times a day as needed. Apply to rash as needed, Disp: , Rfl:     zinc oxide-cod liver oil (DESITIN 40%) 40 % Pste, Apply topically daily as needed., Disp: 57 g, Rfl: 0    Allergies  Adhesive tape-silicones; Ferrlecit [sodium ferric gluconat-sucrose]; Levofloxacin; Methylnaltrexone; Neomycin; Papaya; Morphine; Zosyn [piperacillin-tazobactam]; Compazine [prochlorperazine]; Iron analogues; Iron dextran; and Latex, natural rubber    Family History  Family History   Problem Relation Age of Onset    No Known Problems Mother     No Known Problems Father     No Known Problems Sister     No Known Problems Brother     Stroke Maternal Grandmother     Other Maternal Grandmother         benign lesions of liver and pancreas, further details unknown    Cancer Maternal Grandmother     Diabetes Maternal Grandmother     Hypertension Maternal Grandmother     Thyroid disease Maternal Grandmother     Arthritis Maternal Grandfather     Asthma Maternal Grandfather     COPD Paternal Grandmother         Deceased    Miscarriages / Stillbirths Paternal Grandmother     Alcohol abuse Paternal Grandfather         Deceased    No Known Problems Maternal Aunt     No Known Problems Maternal Uncle     No Known Problems Paternal Aunt     No Known Problems Paternal Uncle     Anesthesia problems Neg Hx     Broken bones Neg Hx     Cancer Neg Hx     Clotting disorder Neg Hx     Collagen disease Neg Hx     Diabetes Neg Hx     Dislocations Neg Hx     Fibromyalgia Neg Hx     Gout Neg Hx     Hemophilia Neg Hx     Osteoporosis Neg Hx     Rheumatologic disease Neg Hx     Scoliosis Neg Hx     Severe sprains Neg Hx     Sickle cell anemia Neg Hx     Spinal Compression Fracture Neg Hx     Melanoma Neg Hx     Basal cell carcinoma Neg Hx     Squamous cell carcinoma Neg Hx  Social History  Social History     Tobacco Use    Smoking status: Never     Passive exposure: Past    Smokeless tobacco: Never   Vaping Use    Vaping status: Never Used   Substance Use Topics    Alcohol use: Never    Drug use: Never        Physical Exam     VITAL SIGNS:      Vitals:    03/08/23 1243 03/08/23 1506 03/08/23 1525 03/08/23 1536   BP: 133/94  138/81    Pulse: 101 83 80 81   Resp: 19 11 11 10    Temp: 36.8 ??C (98.2 ??F)      TempSrc: Oral      SpO2: 100% 97% 98% 97%   Weight: 52.2 kg (115 lb)          Constitutional: Alert and oriented. No acute distress.  Eyes: Conjunctivae are normal.  HEENT: Normocephalic and atraumatic. Conjunctivae clear. No congestion. Moist mucous membranes.   Cardiovascular: Rate as above, regular rhythm. Normal and symmetric distal pulses. Brisk capillary refill. Normal skin turgor.  Respiratory: Normal respiratory effort. Breath sounds are normal. There are no wheezing or crackles heard.  Gastrointestinal: Soft, non-distended, non-tender.  Genitourinary: Deferred.  Musculoskeletal: Non-tender with normal range of motion in all extremities.  Neurologic: Normal speech and language. No gross focal neurologic deficits are appreciated. Patient is moving all extremities equally, face is symmetric at rest and with speech.  Skin: Skin is warm, dry and intact. No rash noted.  Psychiatric: Mood and affect are normal. Speech and behavior are normal.     Radiology     No orders to display       Pertinent labs & imaging results that were available during my care of the patient were independently interpreted by me and considered in my medical decision making (see chart for details).    Portions of this record have been created using Scientist, clinical (histocompatibility and immunogenetics). Dictation errors have been sought, but may not have been identified and corrected. Satira Mccallum, MD  Resident  03/08/23 1640

## 2023-03-08 NOTE — Unmapped (Signed)
Upcoming Appt:  Future Appointments   Date Time Provider Department Center   03/12/2023 11:15 AM [facility] MRI RM 5 [facility]   03/12/2023 12:00 PM [facility] MRI RM 5 [facility]   03/13/2023  9:30 AM Sheral Apley, MD ONCMULTI [facility] [facility]   03/20/2023  9:30 AM Proch, Valentina Shaggy, RD/LDN UNCHNUGIEAS [place] [facility]   03/23/2023  1:30 PM Ward Givens, PMHNP [id number] [facility] [facility]   04/01/2023  2:00 PM Herfarth, Philippa Chester, MD UNCGIMEDET [facility]   08/11/2023 10:00 AM Mooberry, French Ana, MD UNCBENHEMET [facility] [facility]       Disposition:  Go to ED Now    Is this a pediatric patient?   No   Any recent, relevant visit?   Yes   Date/location of recent visit?  12/17;   had MRI today - abdominal & pelvis - couldn't get through all the MRIs she was supposed to have today  Had 3 month hospital stay and discharged almost 3 months ago    Any related medications?  Yes   Name of medication and dose?  10 mg oxycodone up to 6 x daily  Buprenorphine patch      Any relevant medical history?   Yes   What history?  Hereditary colon cancer  Desmoid tumors  J-pouch - has no colon  Uses wheelchair but can walk some      Any interventions?   Yes   What has been done? (include related medication given, dose, and date/time)  Usual pain meds; has doubled up on oxycodone 2 x in last 24 hrs      Reason for Disposition   [1] SEVERE pain (e.g., excruciating) AND [2] present > 1 hour    Answer Assessment - Initial Assessment Questions  1. LOCATION: Where does it hurt?       Lower to mid-right abdominal area (where she has tumors that are tethered to her bowel & abdominal and mesentary)  2. RADIATION: Does the pain shoot anywhere else? (e.g., chest, back)      Not radiating  3. ONSET: When did the pain begin? (e.g., minutes, hours or days ago)       Chronic pain worsening over the past week  4. SUDDEN: Gradual or sudden onset?      sudden  5. PATTERN Does the pain come and go, or is it constant?      constant  6. SEVERITY: How bad is the pain?  (e.g., Scale 1-10; mild, moderate, or severe)      10/10  7. RECURRENT SYMPTOM: Have you ever had this type of stomach pain before? If Yes, ask: When was the last time? and What happened that time?       Has this kind of pain before. The last time it was this bad she was hospitalized for 3 months  8. CAUSE: What do you think is causing the stomach pain?      Chronic illnesses; lost mobility related to last hospital stay  9. RELIEVING/AGGRAVATING FACTORS: What makes it better or worse? (e.g., antacids, bending or twisting motion, bowel movement)      Pain med helps, ice might help, laying with a pillow under back.  10. OTHER SYMPTOMS: Do you have any other symptoms? (e.g., back pain, diarrhea, fever, urination pain, vomiting)        Low back pain, more frequent, watery Bms; bleeding with Bms; no urination pain or vomiting or fever  11. PREGNANCY: Is there any chance you are pregnant?  When was your last menstrual period?        No; chemo has put her into menopause    Protocols used: Abdominal Pain - [facility]

## 2023-03-09 DIAGNOSIS — D48119 Desmoid tumor: Principal | ICD-10-CM

## 2023-03-09 LAB — COMPREHENSIVE METABOLIC PANEL
ALBUMIN: 3.9 g/dL (ref 3.4–5.0)
ALKALINE PHOSPHATASE: 95 U/L (ref 46–116)
ALT (SGPT): 15 U/L (ref 10–49)
ANION GAP: 11 mmol/L (ref 5–14)
AST (SGOT): 22 U/L (ref <=34)
BILIRUBIN TOTAL: 0.4 mg/dL (ref 0.3–1.2)
BLOOD UREA NITROGEN: 6 mg/dL — ABNORMAL LOW (ref 9–23)
BUN / CREAT RATIO: 13
CALCIUM: 9.6 mg/dL (ref 8.7–10.4)
CHLORIDE: 103 mmol/L (ref 98–107)
CO2: 27 mmol/L (ref 20.0–31.0)
CREATININE: 0.46 mg/dL — ABNORMAL LOW (ref 0.55–1.02)
EGFR CKD-EPI (2021) FEMALE: >90 mL/min/{1.73_m2} (ref >=60)
GLUCOSE RANDOM: 101 mg/dL (ref 70–179)
POTASSIUM: 3.2 mmol/L — ABNORMAL LOW (ref 3.4–4.8)
PROTEIN TOTAL: 7.4 g/dL (ref 5.7–8.2)
SODIUM: 141 mmol/L (ref 135–145)

## 2023-03-09 LAB — CBC W/ AUTO DIFF
BASOPHILS ABSOLUTE COUNT: 0 10*9/L (ref 0.0–0.1)
BASOPHILS RELATIVE PERCENT: 0.4 %
EOSINOPHILS ABSOLUTE COUNT: 0.3 10*9/L (ref 0.0–0.5)
EOSINOPHILS RELATIVE PERCENT: 3.4 %
HEMATOCRIT: 40.4 % (ref 34.0–44.0)
HEMOGLOBIN: 13.2 g/dL (ref 11.3–14.9)
LYMPHOCYTES ABSOLUTE COUNT: 2.3 10*9/L (ref 1.1–3.6)
LYMPHOCYTES RELATIVE PERCENT: 25.6 %
MEAN CORPUSCULAR HEMOGLOBIN CONC: 32.7 g/dL (ref 32.0–36.0)
MEAN CORPUSCULAR HEMOGLOBIN: 26.2 pg (ref 25.9–32.4)
MEAN CORPUSCULAR VOLUME: 80.2 fL (ref 77.6–95.7)
MEAN PLATELET VOLUME: 10.9 fL — ABNORMAL HIGH (ref 6.8–10.7)
MONOCYTES ABSOLUTE COUNT: 1 10*9/L — ABNORMAL HIGH (ref 0.3–0.8)
MONOCYTES RELATIVE PERCENT: 11.6 %
NEUTROPHILS ABSOLUTE COUNT: 5.2 10*9/L (ref 1.8–7.8)
NEUTROPHILS RELATIVE PERCENT: 59 %
PLATELET COUNT: 239 10*9/L (ref 150–450)
RED BLOOD CELL COUNT: 5.04 10*12/L (ref 3.95–5.13)
RED CELL DISTRIBUTION WIDTH: 15.1 % (ref 12.2–15.2)
WBC ADJUSTED: 8.9 10*9/L (ref 3.6–11.2)

## 2023-03-09 LAB — URIC ACID: URIC ACID: 2.9 mg/dL — ABNORMAL LOW (ref 3.1–7.8)

## 2023-03-09 LAB — MAGNESIUM: MAGNESIUM: 2.4 mg/dL (ref 1.6–2.6)

## 2023-03-09 LAB — APTT
APTT: 39.8 s — ABNORMAL HIGH (ref 24.8–38.4)
HEPARIN CORRELATION: 0.2

## 2023-03-09 LAB — LACTATE DEHYDROGENASE: LACTATE DEHYDROGENASE: 162 U/L (ref 120–246)

## 2023-03-09 LAB — IONIZED CALCIUM VENOUS: CALCIUM IONIZED VENOUS (MG/DL): 4.66 mg/dL (ref 4.40–5.40)

## 2023-03-09 LAB — FIBRINOGEN: FIBRINOGEN LEVEL: 460 mg/dL (ref 175–500)

## 2023-03-09 LAB — PROTIME-INR
INR: 1.04
PROTIME: 11.8 s (ref 9.9–12.6)

## 2023-03-09 LAB — D-DIMER, QUANTITATIVE: D-DIMER QUANTITATIVE (CW,ML,HL,HS,CH,JS,JC,RX,RH): <215 ng{FEU}/mL (ref <=500)

## 2023-03-09 LAB — LACTATE, VENOUS, WHOLE BLOOD: LACTATE BLOOD VENOUS: 1.2 mmol/L (ref 0.5–1.8)

## 2023-03-09 LAB — PHOSPHORUS: PHOSPHORUS: 2.6 mg/dL (ref 2.4–5.1)

## 2023-03-09 MED ADMIN — potassium chloride 10 mEq in 100 mL IVPB: 10 meq | INTRAVENOUS | @ 13:00:00 | Stop: 2023-03-09

## 2023-03-09 MED ADMIN — famotidine (PEPCID) tablet 20 mg: 20 mg | ORAL | @ 05:00:00

## 2023-03-09 MED ADMIN — lidocaine (ASPERCREME) 4 % 2 patch: 2 | TRANSDERMAL | @ 16:00:00 | Stop: 2023-03-09

## 2023-03-09 MED ADMIN — sucralfate (CARAFATE) oral suspension: 1 g | ORAL | @ 16:00:00

## 2023-03-09 MED ADMIN — potassium chloride 10 mEq in 100 mL IVPB: 10 meq | INTRAVENOUS | @ 19:00:00 | Stop: 2023-03-09

## 2023-03-09 MED ADMIN — HYDROmorphone (PF) (DILAUDID) injection Syrg 0.5 mg: .5 mg | INTRAVENOUS | @ 23:00:00 | Stop: 2023-03-12

## 2023-03-09 MED ADMIN — HYDROmorphone (PF) (DILAUDID) injection 1 mg: 1 mg | INTRAVENOUS | @ 20:00:00 | Stop: 2023-03-09

## 2023-03-09 MED ADMIN — sodium chloride (NS) 0.9 % flush 10 mL: 10 mL | INTRAVENOUS | @ 05:00:00

## 2023-03-09 MED ADMIN — promethazine (PHENERGAN) 12.5 mg in sodium chloride (NS) 0.9 % 50 mL IVPB: 12.5 mg | INTRAVENOUS | @ 08:00:00 | Stop: 2023-03-09

## 2023-03-09 MED ADMIN — acetaminophen (TYLENOL) tablet 1,000 mg: 1000 mg | ORAL | @ 13:00:00 | Stop: 2023-03-09

## 2023-03-09 MED ADMIN — HYDROmorphone (PF) (DILAUDID) injection 1 mg: 1 mg | INTRAVENOUS | @ 05:00:00 | Stop: 2023-03-09

## 2023-03-09 MED ADMIN — fluticasone propionate (FLONASE) 50 mcg/actuation nasal spray 2 spray: 2 | TOPICAL | @ 08:00:00

## 2023-03-09 MED ADMIN — potassium chloride 10 mEq in 100 mL IVPB: 10 meq | INTRAVENOUS | @ 16:00:00 | Stop: 2023-03-09

## 2023-03-09 MED ADMIN — HYDROmorphone (PF) (DILAUDID) injection 1 mg: 1 mg | INTRAVENOUS | @ 02:00:00 | Stop: 2023-03-09

## 2023-03-09 MED ADMIN — promethazine (PHENERGAN) 12.5 mg in sodium chloride (NS) 0.9 % 50 mL IVPB: 12.5 mg | INTRAVENOUS | Stop: 2023-03-08

## 2023-03-09 MED ADMIN — oxyCODONE (ROXICODONE) immediate release tablet 10 mg: 10 mg | ORAL | @ 13:00:00 | Stop: 2023-03-09

## 2023-03-09 MED ADMIN — naloxegol (MOVANTIK) 12.5 mg tablet 12.5 mg: 12.5 mg | ORAL | @ 16:00:00

## 2023-03-09 MED ADMIN — promethazine (PHENERGAN) 12.5 mg in sodium chloride (NS) 0.9 % 50 mL IVPB: 12.5 mg | INTRAVENOUS | @ 21:00:00 | Stop: 2023-03-09

## 2023-03-09 MED ADMIN — sucralfate (CARAFATE) oral suspension: 1 g | ORAL | @ 22:00:00

## 2023-03-09 MED ADMIN — sucralfate (CARAFATE) oral suspension: 1 g | ORAL | @ 05:00:00

## 2023-03-09 MED ADMIN — HYDROmorphone (PF) (DILAUDID) injection Syrg 0.5 mg: 0.5 mg | INTRAVENOUS | @ 04:00:00 | Stop: 2023-03-08

## 2023-03-09 MED ADMIN — HYDROmorphone (PF) (DILAUDID) injection 1 mg: 1 mg | INTRAVENOUS | @ 15:00:00 | Stop: 2023-03-09

## 2023-03-09 MED ADMIN — buprenorphine 20 mcg/hour transdermal patch 1 patch: 1 | TRANSDERMAL | @ 05:00:00

## 2023-03-09 MED ADMIN — escitalopram oxalate (LEXAPRO) tablet 5 mg: 5 mg | ORAL | @ 15:00:00

## 2023-03-09 MED ADMIN — ondansetron (ZOFRAN) injection 4 mg: 4 mg | INTRAVENOUS | @ 18:00:00 | Stop: 2023-03-09

## 2023-03-09 MED ADMIN — potassium chloride 10 mEq in 100 mL IVPB: 10 meq | INTRAVENOUS | @ 22:00:00 | Stop: 2023-03-09

## 2023-03-09 MED ADMIN — HYDROmorphone (PF) (DILAUDID) injection 1 mg: 1 mg | INTRAVENOUS | @ 08:00:00 | Stop: 2023-03-09

## 2023-03-09 MED ADMIN — oxyCODONE (ROXICODONE) 5 mg/5 mL solution 15 mg: 15 mg | ORAL | @ 21:00:00 | Stop: 2023-03-09

## 2023-03-09 MED ADMIN — scopolamine (TRANSDERM-SCOP) 1 mg over 3 days topical patch 1 mg: 1 | TOPICAL | @ 05:00:00

## 2023-03-09 MED ADMIN — acetaminophen (TYLENOL) tablet 1,000 mg: 1000 mg | ORAL | @ 23:00:00

## 2023-03-09 MED FILL — OGSIVEO 50 MG TABLET: ORAL | 15 days supply | Qty: 90 | Fill #6

## 2023-03-09 NOTE — Unmapped (Signed)
Oncology (MEDO) History & Physical    Assessment & Plan:   [name] [name] is a 22 y.o. female whose presentation is complicated by [name] c/b desmoid fibromatomsis, recurrent ileus, GI bleeeds, DVT, MDD, and GAD that presented to [facility] with refractory pain iso tumor burden.     Principal Problem:    Chronic pain due to neoplasm  Active Problems:    [name]    Generalized anxiety disorder with panic attacks    GI bleeding    MDD (major depressive disorder)    Ileus (CMS-HCC)    Dizziness    Fatigue    Hypercalcemia      Active Problems    Acute on Chronic Generalized Pain - [name] Burden  The patient follows with [place] pain management, most recently was seen in clinic in December 2024.  It has been documented that IV opioids likely precipitate narcotic bowel syndrome and generates a positive feedback loop. Patient with multiple instances of ileus while hospitalized due to high requirement of opioids. She has been placed on an IV lidocaine drip and IV ketamine in the past for uncontrolled pain, and has been noted by her outpatient pain providers that this would be recommended for future inpatient treatments (see pain management note 02/18/23). Suspect patients pain is multifaceted including psychosocial along with tumor burden. Given complex nature of her pain, will consult chronic pain management and await recommendations.  For now, will continue home regimen with a breakthrough available as below.   - Tylenol 1000 mg 3 times daily  - Oxycodone 10 mg q6hr daily  - Dilaudid 1 mg q2 hr PRN   - Butrans patch   - follow up chronic pain recs    Dizziness - Fatigue - Malnutrition   Patient reports recent dizziness upon standing and generalized feeling of malaise. She reports she has lost 5lbs over the past week or so. Of note, she did have her corepak removed early December due to blockage. Was on TF regimen Osmolite 1.5 as tolerated, 50-75 mL/h. No objective evidence by imaging or endoscopic findings to suggest etiology of inability to tolerate PO intake.Follows with nutrition outpatient. I suspect patient is orthostatic 2/2 poor PO intake.   - orthostatic vitals  - encourage PO  - nutrition consult     Hematochezia - Bleeding from Ears/Nose  Has a history of chronic GI bleed from anastomotic ulcer.  Per patient, unchanged from baseline. She also reports an episode of bleeding from both her ears and nose simultaneously a few days prior. She reports it just stopped on its own. Her hemoglobin on admission is 15.5 which is actually improved from prior. She and her mother report this is the highest it has ever been. Will continue to monitor unless clinical change warranting GI consultation/endoscopic evaluation.   - daily CBC     Hypercalcemia   Total calcium of 10.8. Asymptomatic.   - iCal ordered     Desmoid Fibromatosis -  Familial Adenomatous Polyposis - s/p Total Abdominal Colectomy w J Pouch  Patient follows with Dr. [name].  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  Was most recently seen on 12/17 at which time nirogacet was continued.  Of note, outpatient MRI was scheduled for restaging of desmoid tumor which was partially completed today. Patient reports she was only able to tolerate the abdomen and pelvis. Her pain  was too great to continue.  - Notify Dr.Stein of admission in AM  -Touch base with pharmacy re: nirogacet while in hospital  - consider completion of MRI scans     Iron deficiency anemia - History of DVT   Follows with [facility]ology. History of anaphylaxis to IV iron.   - monitor Hgb daily   - SCDs in place     Generalized Anxiety Disorder - Major Depressive Disorder  Follows with [facility].   - continue nightly Mirtazapine 7.5 mg   - Escitalopram 5 mg daily     The patient's presentation is complicated by the following clinically significant conditions requiring additional evaluation and treatment: - Hypercoagulable state requiring additional attention to DVT prophylaxis and treatment or chronic anticoagulation  - Complex social situation/SDOH requiring consultation and support of Care Management  - Metastatic cancer POA requiring further investigation, treatment, or monitoring     Issues Impacting Complexity of Management:  -Parenteral controlled medications: IV Dilaudid    Medical Decision Making: Reviewed records from the following unique sources  Various Subspecialists.    Checklist:  Diet: Regular Diet  DVT PPx: Contraindicated - High Risk for Bleeding/Active Bleeding  Code Status: Full Code  Dispo: Patient appropriate for Inpatient based on expectation of ongoing need for hospitalization greater than two midnights based on severity of presentation/services including [name] and pain control    Team Contact Information:   Primary Team: Oncology (MEDO)  Primary Resident: Emogene Morgan, MD  Resident's Pager: [phone] (Oncology Senior Resident)    Chief Concern:   Chronic pain due to neoplasm    Subjective:   [name] [name] is a 22 y.o. female with pertinent PMHx of [name] c/b desmoid fibromatomsis, recurrent ileus, GI bleeeds, DVT, MDD, and GAD  presenting with acute on chronic pain.      History obtained by patient and mother.     HPI:  Patient reports she has been feeling rather unwell lately. She says in early December, her Corepak, through which she received tube feeds, became blocked and was pulled in clinic. She reports she has lost significant weight; 5lbs just over the past week. She also reports she has had large volume hematochezia and an incident of bleeding from her ears and nose that self-resolved. She is worried that she may be iron deficient. Reassuringly, her hemoglobin is within normal limits on admission. She also reports a generalized feeling of malaise and worsening pain. Her pain is localized to her abdomen, SI joint, and back. She says it is usually well controlled with her home regimen, but has become unbearable over the past week.     In the ED, patient was afebrile and HDS Labs notable for a Hgb of 15.5, plts of 239. MRI abdomen with slightly increased size of desmoid tumor in abdomen, narrowing of left renal vein as it crosses SMA/aorta, soft tissue lesions in right rectus musculature which are similar in size to imaging in September. She received IV Dilaudid 1 mg q 2 hr and paged out to oncology service for further work up and management.     Pertinent Surgical Hx  Past Surgical History:   Procedure Laterality Date    COLON SURGERY      cyroablation      cystis removal      desmoid removal      PR CLOSE ENTEROSTOMY,RESEC+ANAST N/A 10/09/2020    Procedure: ILEOSTOMY TAKEDOWN;  Surgeon: Mickle Asper, MD;  Location: OR [place];  Service: General Surgery  PR COLONOSCOPY W/BIOPSY SINGLE/MULTIPLE N/A 10/27/2012    Procedure: COLONOSCOPY, FLEXIBLE, PROXIMAL TO SPLENIC FLEXURE; WITH BIOPSY, SINGLE OR MULTIPLE;  Surgeon: Shirlyn Goltz Mir, MD;  Location: PEDS PROCEDURE ROOM [facility];  Service: Gastroenterology    PR COLONOSCOPY W/BIOPSY SINGLE/MULTIPLE N/A 09/14/2013    Procedure: COLONOSCOPY, FLEXIBLE, PROXIMAL TO SPLENIC FLEXURE; WITH BIOPSY, SINGLE OR MULTIPLE;  Surgeon: Shirlyn Goltz Mir, MD;  Location: PEDS PROCEDURE ROOM [place];  Service: Gastroenterology    PR COLONOSCOPY W/BIOPSY SINGLE/MULTIPLE N/A 11/08/2014    Procedure: COLONOSCOPY, FLEXIBLE, PROXIMAL TO SPLENIC FLEXURE; WITH BIOPSY, SINGLE OR MULTIPLE;  Surgeon: Arnold Long Mir, MD;  Location: PEDS PROCEDURE ROOM [facility];  Service: Gastroenterology    PR COLONOSCOPY W/BIOPSY SINGLE/MULTIPLE N/A 12/26/2015    Procedure: COLONOSCOPY, FLEXIBLE, PROXIMAL TO SPLENIC FLEXURE; WITH BIOPSY, SINGLE OR MULTIPLE;  Surgeon: Arnold Long Mir, MD;  Location: PEDS PROCEDURE ROOM [facility];  Service: Gastroenterology    PR COLONOSCOPY W/BIOPSY SINGLE/MULTIPLE N/A 09/02/2017 Procedure: COLONOSCOPY, FLEXIBLE, PROXIMAL TO SPLENIC FLEXURE; WITH BIOPSY, SINGLE OR MULTIPLE;  Surgeon: Arnold Long Mir, MD;  Location: PEDS PROCEDURE ROOM [place];  Service: Gastroenterology    PR COLSC FLX W/REMOVAL LESION BY HOT BX FORCEPS N/A 08/27/2016    Procedure: COLONOSCOPY, FLEXIBLE, PROXIMAL TO SPLENIC FLEXURE; W/REMOVAL TUMOR/POLYP/OTHER LESION, HOT BX FORCEP/CAUTE;  Surgeon: Arnold Long Mir, MD;  Location: PEDS PROCEDURE ROOM [facility];  Service: Gastroenterology    PR COLSC FLX W/RMVL OF TUMOR POLYP LESION SNARE TQ N/A 02/25/2019    Procedure: COLONOSCOPY FLEX; W/REMOV TUMOR/LES BY SNARE;  Surgeon: Helyn Numbers, MD;  Location: GI PROCEDURES [place] [facility];  Service: Gastroenterology    PR COLSC FLX W/RMVL OF TUMOR POLYP LESION SNARE TQ N/A 03/13/2020    Procedure: COLONOSCOPY FLEX; W/REMOV TUMOR/LES BY SNARE;  Surgeon: Helyn Numbers, MD;  Location: [facility]ED[facility] [place] [facility];  Service: Gastroenterology    PR EXC SKIN BENIG 2.1-3 CM TRUNK,ARM,LEG Right 02/25/2017    Procedure: EXCISION, BENIGN LESION INCLUDE MARGINS, EXCEPT SKIN TAG, LEGS; EXCISED DIAMETER 2.1 TO 3.0 CM;  Surgeon: Clarene Duke, MD;  Location: CHILDRENS OR [facility];  Service: Plastics    PR EXC SKIN BENIG 3.1-4 CM TRUNK,ARM,LEG Right 02/25/2017    Procedure: EXCISION, BENIGN LESION INCLUDE MARGINS, EXCEPT SKIN TAG, ARMS; EXCISED DIAMETER 3.1 TO 4.0 CM;  Surgeon: Clarene Duke, MD;  Location: CHILDRENS OR [facility];  Service: Plastics    PR EXC SKIN BENIG >4 CM FACE,FACIAL Right 02/25/2017    Procedure: EXCISION, OTHER BENIGN LES INCLUD MARGIN, FACE/EARS/EYELIDS/NOSE/LIPS/MUCOUS MEMBRANE; EXCISED DIAM >4.0 CM;  Surgeon: Clarene Duke, MD;  Location: CHILDRENS OR [facility];  Service: Plastics    PR EXC TUMOR SOFT TISSUE LEG/ANKLE SUBQ 3+CM Right 08/05/2019    Procedure: EXCISION, TUMOR, SOFT TISSUE OF LEG OR ANKLE AREA, SUBCUTANEOUS; 3 CM OR GREATER;  Surgeon: Arsenio Katz, MD;  Location: MAIN OR [place]; Service: Plastics    PR EXC TUMOR SOFT TISSUE LEG/ANKLE SUBQ <3CM Right 08/05/2019    Procedure: EXCISION, TUMOR, SOFT TISSUE OF LEG OR ANKLE AREA, SUBCUTANEOUS; LESS THAN 3 CM;  Surgeon: Arsenio Katz, MD;  Location: MAIN OR [facility];  Service: Plastics    PR LAP, SURG PROCTECTOMY W J-POUCH N/A 08/10/2020    Procedure: ROBOTIC ASSISTED LAPAROSCOPIC PROCTOCOLECTOMY, ILEAL J POUCH, WITH OSTOMY;  Surgeon: Mickle Asper, MD;  Location: OR [place];  Service: General Surgery    PR NDSC EVAL INTSTINAL POUCH DX W/COLLJ SPEC SPX N/A 01/23/2021    Procedure: ENDO EVAL SM INTEST POUCH; DX;  Surgeon: Lisabeth Register  Reece Agar, MD;  [facility] [place] [facility];  Service: Gastroenterology    PR NDSC EVAL INTSTINAL POUCH DX W/COLLJ SPEC SPX N/A 08/27/2021    Procedure: ENDO EVAL SM INTEST POUCH; DX;  Surgeon: Hunt Oris, MD;  Location: GI PROCEDURES [facility];  Service: Gastroenterology    PR NDSC EVAL INTSTINAL POUCH DX W/COLLJ SPEC SPX N/A 12/09/2021    Procedure: ENDO EVAL SM INTEST POUCH; DX;  Surgeon: Vidal Schwalbe, MD;  Location: GI PROCEDURES [facility];  Service: Gastroenterology    PR NDSC EVAL INTSTINAL POUCH DX W/COLLJ [facility] SPX Left 04/09/2022    Procedure: ENDO EVAL SM INTEST POUCH; DX;  Surgeon: Modena Nunnery, MD;  [facility] [place] [facility];  Service: Gastroenterology    PR NDSC EVAL INTSTINAL POUCH DX W/COLLJ SPEC SPX N/A 08/05/2022    Procedure: ENDO EVAL SM INTEST POUCH; DX;  Surgeon: Modena Nunnery, MD;  Location: GI PROCEDURES [facility];  Service: Gastroenterology    PR NDSC EVAL INTSTINAL POUCH W/BX SINGLE/MULTIPLE N/A 01/20/2022    Procedure: ENDOSCOPIC EVAL OF SMALL INTESTINAL POUCH; DIAGNOSTIC, No biopsies;  Surgeon: Andrey Farmer, MD;  Location: GI PROCEDURES [facility];  Service: Gastroenterology    PR NDSC EVAL INTSTINAL POUCH W/BX SINGLE/MULTIPLE N/A 02/13/2022    Procedure: ENDOSCOPIC EVAL OF SMALL INTESTINAL POUCH; DIAGNOSTIC, WITH BIOPSY;  Surgeon: Bronson Curb, MD;  Location: GI PROCEDURES [facility];  Service: Gastroenterology    PR UNLISTED PROCEDURE SMALL INTESTINE  01/23/2021    Procedure: UNLISTED PROCEDURE, SMALL INTESTINE;  Surgeon: Modena Nunnery, MD;  Location: [facility] PROCEDURES [place] [facility];  Service: Gastroenterology    PR UNLISTED PROCEDURE SMALL INTESTINE  02/13/2022    Procedure: UNLISTED PROCEDURE, SMALL INTESTINE;  Surgeon: Bronson Curb, MD;  Location: GI PROCEDURES [facility];  Service: Gastroenterology    PR UPPER GI ENDOSCOPY,BIOPSY N/A 10/27/2012    Procedure: UGI ENDOSCOPY; WITH BIOPSY, SINGLE OR MULTIPLE;  Surgeon: Shirlyn Goltz Mir, MD;  Location: PEDS PROCEDURE ROOM [facility];  Service: Gastroenterology    PR UPPER GI ENDOSCOPY,BIOPSY N/A 09/14/2013    Procedure: UGI ENDOSCOPY; WITH BIOPSY, SINGLE OR MULTIPLE;  Surgeon: Shirlyn Goltz Mir, MD;  Location: PEDS PROCEDURE ROOM [facility];  Service: Gastroenterology    PR UPPER GI ENDOSCOPY,BIOPSY N/A 11/08/2014    Procedure: UGI ENDOSCOPY; WITH BIOPSY, SINGLE OR MULTIPLE;  Surgeon: Arnold Long Mir, MD;  Location: PEDS PROCEDURE ROOM [facility];  Service: Gastroenterology    PR UPPER GI ENDOSCOPY,BIOPSY N/A 12/26/2015    Procedure: UGI ENDOSCOPY; WITH BIOPSY, SINGLE OR MULTIPLE;  Surgeon: Arnold Long Mir, MD;  Location: PEDS PROCEDURE ROOM [facility];  Service: Gastroenterology    PR UPPER GI ENDOSCOPY,BIOPSY N/A 08/27/2016    Procedure: UGI ENDOSCOPY; WITH BIOPSY, SINGLE OR MULTIPLE;  Surgeon: Arnold Long Mir, MD;  Location: PEDS PROCEDURE ROOM [facility];  Service: Gastroenterology    PR UPPER GI ENDOSCOPY,BIOPSY N/A 09/02/2017    Procedure: UGI ENDOSCOPY; WITH BIOPSY, SINGLE OR MULTIPLE;  Surgeon: Arnold Long Mir, MD;  Location: PEDS PROCEDURE ROOM [facility];  Service: Gastroenterology    PR UPPER GI ENDOSCOPY,BIOPSY N/A 03/13/2020    Procedure: UGI ENDOSCOPY; WITH BIOPSY, SINGLE OR MULTIPLE;  Surgeon: Helyn Numbers, MD;  Location: [facility] [place] [facility];  Service: Gastroenterology    PR UPPER GI ENDOSCOPY,BIOPSY N/A 09/05/2021    Procedure: UGI ENDOSCOPY; WITH BIOPSY, SINGLE OR MULTIPLE;  Surgeon: Wendall Papa, MD;  Location: GI PROCEDURES [facility];  Service: Gastroenterology    PR UPPER GI ENDOSCOPY,DIAGNOSIS  N/A 01/20/2022    Procedure: UGI ENDO, INCLUDE ESOPHAGUS, STOMACH, & DUODENUM &/OR JEJUNUM; DX W/WO COLLECTION SPECIMN, BY BRUSH OR WASH;  Surgeon: Andrey Farmer, MD;  Location: GI PROCEDURES [facility];  Service: Gastroenterology    TUMOR REMOVAL      multiple-head, neck, back, hand, right flank, multiple         Pertinent Family Hx  Family History   Problem Relation Age of Onset    No Known Problems Mother     No Known Problems Father     No Known Problems Sister     No Known Problems Brother     Stroke Maternal Grandmother     Other Maternal Grandmother         benign lesions of liver and pancreas, further details unknown    Cancer Maternal Grandmother     Diabetes Maternal Grandmother     Hypertension Maternal Grandmother     Thyroid disease Maternal Grandmother     Arthritis Maternal Grandfather     Asthma Maternal Grandfather     COPD Paternal Grandmother         Deceased    Miscarriages / Stillbirths Paternal Grandmother     Alcohol abuse Paternal Grandfather         Deceased    No Known Problems Maternal Aunt     No Known Problems Maternal Uncle     No Known Problems Paternal Aunt     No Known Problems Paternal Uncle     Anesthesia problems Neg Hx     Broken bones Neg Hx     Cancer Neg Hx     Clotting disorder Neg Hx     Collagen disease Neg Hx     Diabetes Neg Hx     Dislocations Neg Hx     Fibromyalgia Neg Hx     Gout Neg Hx     Hemophilia Neg Hx     Osteoporosis Neg Hx     Rheumatologic disease Neg Hx     Scoliosis Neg Hx     Severe sprains Neg Hx     Sickle cell anemia Neg Hx     Spinal Compression Fracture Neg Hx     Melanoma Neg Hx     Basal cell carcinoma Neg Hx     Squamous cell carcinoma Neg Hx          Pertinent Social Hx   Social History     Socioeconomic History    Marital status: Single   Tobacco Use    Smoking status: Never     Passive exposure: Past    Smokeless tobacco: Never   Vaping Use    Vaping status: Never Used   Substance and Sexual Activity    Alcohol use: Never    Drug use: Never    Sexual activity: Never   Other Topics Concern    Do you use sunscreen? Yes    Tanning bed use? No    Are you easily burned? No    Excessive sun exposure? No    Blistering sunburns? 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     Social Drivers of Psychologist, prison and probation services Strain: Low Risk  (09/26/2022)    Overall Financial Resource Strain (CARDIA)     Difficulty of Paying Living Expenses: Not very hard   Food Insecurity: No  Food Insecurity (09/26/2022)    Hunger Vital Sign     Worried About Running Out of Food in the Last Year: Never true     Ran Out of Food in the Last Year: Never true   Transportation Needs: No Transportation Needs (12/10/2022)    PRAPARE - Therapist, art (Medical): No     Lack of Transportation (Non-Medical): No         Allergies  Adhesive tape-silicones; Ferrlecit [sodium ferric gluconat-sucrose]; Levofloxacin; Methylnaltrexone; Neomycin; Papaya; Morphine; Zosyn [piperacillin-tazobactam]; Compazine [prochlorperazine]; Iron analogues; Iron dextran; and Latex, natural rubber    I reviewed the Medication List. The current list is Accurate  Prior to Admission medications    Medication Dose, Route, Frequency   acetaminophen (TYLENOL) 500 MG tablet 1,000 mg, Every 8 hours   buprenorphine (BUTRANS) 20 mcg/hour PTWK transdermal patch 1 patch, Transdermal, Every 7 days   cholecalciferol, vitamin D3 25 mcg, 1,000 units,, 1,000 unit (25 mcg) tablet 25 mcg, Oral, Daily (standard)   COURIERED MED OR SUPPLY OGSVEO 50 MG TABLET.   escitalopram oxalate (LEXAPRO) 5 MG tablet 5 mg, Oral, Daily (standard) famotidine (PEPCID) 20 MG tablet 20 mg, Oral, Nightly   fluticasone propionate (FLONASE) 50 mcg/actuation nasal spray 1 spray, Each Nare, Daily (standard)   hydrocortisone 2.5 % cream Topical, 2 times a day PRN   lidocaine (ASPERCREME) 4 % patch 2 patches, Transdermal, Daily (standard)   LORazepam (ATIVAN) 0.5 MG tablet 1 mg, 3 times a day PRN   mirtazapine (REMERON) 15 MG tablet 15 mg, Oral, Nightly   naloxegol (MOVANTIK) 12.5 mg tablet 12.5 mg, Oral, Daily (standard)   naloxone (NARCAN) 4 mg nasal spray One spray in either nostril once for known/suspected opioid overdose. May repeat every 2-3 minutes in alternating nostril til EMS arrives   nirogacestat (OGSIVEO) 50 mg tablet 150 mg, Oral, 2 times a day (standard), Swallow tablets whole; do not break, crush, or chew.   ondansetron (ZOFRAN-ODT) 8 MG disintegrating tablet 8 mg, orally disintegrating tablet, Every 12 hours PRN   oxyCODONE (ROXICODONE) 10 mg immediate release tablet Please take 1 tab every 4 to 6 hours as needed for pain. Fill on or after: 02/25/23   pimecrolimus (ELIDEL) 1 % cream Topical, 2 times a day PRN, To face as needed for rash   promethazine (PHENERGAN) 12.5 MG tablet 12.5 mg, Oral, Every 6 hours PRN   scopolamine (TRANSDERM-SCOP) 1 mg over 3 days 1 patch, Transdermal, Every 72 hours   sucralfate (CARAFATE) 100 mg/mL suspension 1 g, Oral, 4 times a day   triamcinolone (KENALOG) 0.1 % cream Topical, 2 times a day PRN, Apply to rash as needed   zinc oxide-cod liver oil (DESITIN 40%) 40 % Pste Topical, Daily PRN       Designated Healthcare Decision Maker:  Ms. Litherland currently has decisional capacity for healthcare decision-making and is able to designate a surrogate healthcare decision maker. Ms. Sasseen designated healthcare decision maker(s) is/are MOTHER (the patient's parent) as denoted by stated patient preference.    Objective:   Physical Exam:  Temp:  [36.8 ??C (98.2 ??F)] 36.8 ??C (98.2 ??F)  Heart Rate:  [72-115] 72  SpO2 Pulse: [72-94] 72  Resp:  [10-19] 11  BP: (120-138)/(66-94) 125/66  SpO2:  [95 %-100 %] 97 %    Gen: well appearing F, NAD  Eyes: Sclera anicteric, EOMI grossly normal   HENT: Atraumatic, normocephalic  Neck: Trachea midline  Heart: tachycardic   Lungs: CTAB  Abdomen: Soft, hyperactive bowel sounds   Extremities: No edema  Skin:  No rashes, lesions on clothed exam  Psych: Alert, oriented, speech tangential     Melvia Heaps, MD  PGY-2 Internal Medicine

## 2023-03-09 NOTE — Unmapped (Signed)
ED Progress Note    Received sign out from previous provider.    Patient Summary: [name] [name] is a 22 y.o. female with past medical history of desmoid fibromatosis and [name] presenting with intractable cancer related pain as described in same-day ED provider note.  Action List:   Patient admitted to [facility] pending team assignment for pain management    Updates  ED Course as of 03/09/23 0310   Wynelle Link Mar 08, 2023   1911 Patient received at signout at [phone].  22 year old female with desmoid fibromatosis and [name]. Breakthrough pain, hematochezia, worsening watery diarrhea, lightheaded. Admission for pain management.

## 2023-03-09 NOTE — Unmapped (Signed)
Oncology ([facility]) Progress Note    Assessment & Plan:   [name] [name] is a 22 y.o. female whose presentation is complicated by [name] c/b desmoid fibromatomsis, recurrent ileus, GI bleeding, complex pain, PTSD secondary to chronic medical condition and multiple healthcare interactions who presented to [facility] with refractory pain in setting of large tumor burden.     Principal Problem:    Chronic pain due to neoplasm  Active Problems:    [name]    Generalized anxiety disorder with panic attacks    GI bleeding    MDD (major depressive disorder)    Ileus (CMS-HCC)    Dizziness    Fatigue    Hypercalcemia      Active Problems    Acute on Chronic Generalized & Abdominal Pain - [name] Burden  The patient follows with [place] pain management, most recently was seen in clinic in December 2024. It has been documented that IV opioids likely precipitate narcotic bowel syndrome and generates a positive feedback loop. Patient with multiple instances of ileus while hospitalized due to high requirement of opioids. She has been placed on an IV lidocaine drip and IV ketamine in the past for uncontrolled pain, and has been noted by her outpatient pain providers that this would be recommended for future inpatient treatments (see pain management note 02/18/23). She was evaluated by chronic pain and decided to avoid ketamine at this time as she only has one more session available before May 2025. Holding off on lidocaine infusion to see if increased oral opioids dose would be   - Evaluated by chronic pain team, appreciate recs   - Tylenol 1000 mg 3 times daily scheduled   - Oxycodone 15 mg oral solution q4h scheduled   - Dilaudid 0.5 mg q2 hr PRN   - Ok for 1 time pushes of 1 mg dilaudid if she finds the 0.5 mg dose is ineffective    - Discussed with patient plan to eventually wean IV opioids as we increase oral meds   - Butrans patch   - Lidocaine patch     Dizziness - Fatigue - Malnutrition - Diarrhea   Patient reports recent dizziness upon standing and generalized feeling of malaise. Notes 15-20 stools per day this week, increase from baseline of 10 stools per day. She reports she has lost 5lbs over the past week or so. Of note, she did have her corepak removed early December due to blockage. Was on TF regimen Osmolite 1.5 as tolerated, 50-75 mL/h. No objective evidence by imaging or endoscopic findings to suggest etiology of inability to tolerate PO intake.Follows with nutrition outpatient. Suspect patient is orthostatic 2/2 poor PO intake and increased stools.   - orthostatic vitals  - encourage PO  - nutrition consult      Hematochezia - Bleeding from Ears/Nose  Has a history of chronic GI bleed from anastomotic ulcer.  Per patient, unchanged from baseline. She also reports an episode of bleeding from both her ears and nose simultaneously a few days prior. She reports it just stopped on its own. Her hemoglobin on admission is 15.5 which is likely hemoconcentrated in the setting of poor PO intake. Will continue to monitor unless clinical change warranting GI consultation/endoscopic evaluation.   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 [name].  Known mesenteric desmoid tumor with numerous sites of disease, including paraspinal, chest wall,  right arm/wrist complicated by significant tumor associated pain with recurrent hospitalizations for acute on chronic pain.  Was most recently seen on 12/17 at which time nirogacet was continued.  Of note, outpatient MRI was scheduled for restaging of desmoid tumor which was partially completed today. Patient reports she was only able to tolerate the abdomen and pelvis. Her pain was too great to continue.  - Notified Dr.Stein of admission in AM  -Touch base with pharmacy re: nirogacet while in hospital  - Outpatient MRI scans rescheduled for Thursday. Plan to coordinate so she can obtain while here.      Iron deficiency anemia - History of DVT   Follows with [id number] Hematology. History of anaphylaxis to IV iron.   - monitor Hgb daily   - SCDs in place     Hypercalcemia (resolved)   Total calcium of 10.8. Asymptomatic. iCal normal at 4.66.    PTSD - Generalized Anxiety Disorder - Major Depressive Disorder  PTSD secondary to complex medical condition and interactions with medical system. Follows with [facility].   - continue nightly Mirtazapine 7.5 mg   - Escitalopram 5 mg daily     Issues Impacting Complexity of Management:  -Parenteral controlled medications: IV Dilaudid            Daily Checklist:  Diet: Regular Diet  DVT PPx: SCDs  Electrolytes: Replete Potassium to >/= 3.6 and Magnesium to >/= 1.8  Code Status: Full Code  Dispo: Home pending improvement of pain     Team Contact Information:   Primary Team: Oncology (MEDO)  Primary Resident: Garnet Sierras, MD  Resident's Pager: [phone] (Oncology Intern - Alvester Morin)    Interval History:   Admitted overnight. She notes that her pain was not well controlled overnight as there wasn't consistent IV opioids available. She was not aware that the oral opioids were available for use and therefore had not asked for them.     Her pain is primarily in her lower abdomen where her tumor burden primarily is. She is also experiencing pain in her lower back/sacral area. The generalized body pain is milder.      She denies fever but notes that her current medication has set her into menopause and therefore she always has hot flashes.     She endorses worsening headache, diarrhea with bloody stools (15-20 stools per day, baseline is 10), worsening nausea.     Objective:   Temp:  [36.4 ??C (97.5 ??F)-37 ??C (98.6 ??F)] 37 ??C (98.6 ??F)  Heart Rate:  [64-88] 64  SpO2 Pulse:  [72-96] 96  Resp:  [10-18] 18  BP: (120-136)/(64-86) 122/78  SpO2:  [95 %-99 %] 96 %    Gen: NAD  HENT: atraumatic, normocephalic  Heart: regular rate on tele   Lungs: normal work of breathing on room air   Abdomen: soft, tender lower abdomen with masses   Extremities: No edema

## 2023-03-09 NOTE — Unmapped (Addendum)
[name] [name] is a 22 y.o. 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 Hospital course by problem as below.     Pouchitis  Diarrhea - Hematochezia - Weight Loss   Increased bowel frequency up to 15-20 times daily with associated day time incontinence, urgency, and hematochezia. Hgb mildly decreased from baseline. This is reminiscent of previous episodes of pouchitis. Started cefdinir with improvement of symptoms. Pouchoscopy 03/13/23 revealed intact pouch with minimal inflammation and several superficial ulcerations at the pouch anastamosis and pre-pouch inlet, suggestive of healing pouchitis. Biopsies with moderately active chronic enteritis, no CMV, granuloma, or dysplasia (consistent with pouchitis). She developed severe LLQ abdominal pain following endoscopy which slowly resolved independently, somewhat suspicious for gaseous distention following procedure. Pain management described below. Bowel movements became less frequent and more formed and she was restarted on Movantik prior to discharge. Discharged on Cefdinir 300 mg BID (1/1-1/28), then 300 mg daily (1/29-2/25) with Movantik as needed for constipation. She has GI follow up on 1/22.     Acute on Chronic Generalized & Abdominal Pain  [name] Burden  Pain Related Nausea   The patient follows with  pain management, most recently was seen in clinic in December 2024. It has been documented that IV opioids likely precipitate narcotic bowel syndrome and generates a positive feedback loop. Patient with multiple instances of ileus while hospitalized due to high requirement of opioids. She has been placed on an IV lidocaine drip and IV ketamine in the past for uncontrolled pain, and has been noted by her outpatient pain providers that this would be recommended for future inpatient treatments (see pain management note 02/18/23). She was evaluated by chronic pain and decided to avoid ketamine at this time as she only has one more session available before May 2025. Pain was managed with Buprenorphine patch 20 mcg/hour, PO Tylenol 1000 mg 3 times daily scheduled, PO Oxycodone 20 mg oral solution q4h PRN, and IV Dilaudid 1 mg PRN weaned down gradually. Nausea managed initially with IV Zofran and IV Phenergan, then eventually transitioned to discharge regimen of PO Zofran q8h and PO Phenergan PRN. Appt with pain medicine [name] scheduled for 1/15.     Iron Deficiency - History of DVT   Urticaria 2/2 IV FeraHeme   Follows with [facility] Hematology. History of anaphylaxis to IV iron and will need pre and post medication during any treatments. Most recently received FeraHeme during August 2024 hospitalization with rash/itching but overall was able to complete infusion. Ferritin 37, Tsat 13, iron 29. Iron deficit ~500 mg. Received IV Feraheme on 1/5. Pre-medicated with cetirizine, steroids, benadryl, famotidine as outlined in Allergy note from July 2024. She developed urticaria approximately 30 minutes into infusion but was able to complete the infusion with slowing the rate and further doses of IV benadryl and IV famotidine. Urticaria continued and she had a slow wean off IV benadryl over the next 4 days. Treated with 7-day course of prednisone 40 mg due to prolonged reaction. For future IV iron infusions, please let charge RN know 4-5 days prior to increase staffing as patient requires frequent PRNs for infusion reaction. Encouraged patient to schedule allergy follow up given her prolonged reaction.  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 [name].  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  Was most recently seen on 12/17 at which time nirogacestat was continued.  Of note, outpatient MRI was scheduled for restaging of desmoid tumor which was partially completed on day of admission but discontinued due to inability to tolerate pain. Remaining scans obtained 03/11/23. Nirogacestat was continued while in the hospital. Appt with oncologist Dr. [name] scheduled for 1/14.     PTSD - Generalized Anxiety Disorder - Major Depressive Disorder  PTSD secondary to complex medical condition and interactions with medical system. Follows with [facility] and was seen inpatient by [facility] NP and Consult Liaison Psychologist. Continued on Mirtazapine 7.5 mg and Escitalopram 5 mg daily. Appt with AYA scheduled for 1/13.

## 2023-03-10 LAB — MAGNESIUM: MAGNESIUM: 2.3 mg/dL (ref 1.6–2.6)

## 2023-03-10 LAB — CBC W/ AUTO DIFF
BASOPHILS ABSOLUTE COUNT: 0 10*9/L (ref 0.0–0.1)
BASOPHILS RELATIVE PERCENT: 0.5 %
EOSINOPHILS ABSOLUTE COUNT: 0.3 10*9/L (ref 0.0–0.5)
EOSINOPHILS RELATIVE PERCENT: 4.3 %
HEMATOCRIT: 39.2 % (ref 34.0–44.0)
HEMOGLOBIN: 12.7 g/dL (ref 11.3–14.9)
LYMPHOCYTES ABSOLUTE COUNT: 2 10*9/L (ref 1.1–3.6)
LYMPHOCYTES RELATIVE PERCENT: 29.6 %
MEAN CORPUSCULAR HEMOGLOBIN CONC: 32.3 g/dL (ref 32.0–36.0)
MEAN CORPUSCULAR HEMOGLOBIN: 26.1 pg (ref 25.9–32.4)
MEAN CORPUSCULAR VOLUME: 80.7 fL (ref 77.6–95.7)
MEAN PLATELET VOLUME: 10.9 fL — ABNORMAL HIGH (ref 6.8–10.7)
MONOCYTES ABSOLUTE COUNT: 0.9 10*9/L — ABNORMAL HIGH (ref 0.3–0.8)
MONOCYTES RELATIVE PERCENT: 12.9 %
NEUTROPHILS ABSOLUTE COUNT: 3.5 10*9/L (ref 1.8–7.8)
NEUTROPHILS RELATIVE PERCENT: 52.7 %
PLATELET COUNT: 208 10*9/L (ref 150–450)
RED BLOOD CELL COUNT: 4.85 10*12/L (ref 3.95–5.13)
RED CELL DISTRIBUTION WIDTH: 15.1 % (ref 12.2–15.2)
WBC ADJUSTED: 6.6 10*9/L (ref 3.6–11.2)

## 2023-03-10 LAB — COMPREHENSIVE METABOLIC PANEL
ALBUMIN: 3.6 g/dL (ref 3.4–5.0)
ALKALINE PHOSPHATASE: 77 U/L (ref 46–116)
ALT (SGPT): 13 U/L (ref 10–49)
ANION GAP: 10 mmol/L (ref 5–14)
AST (SGOT): 14 U/L (ref <=34)
BILIRUBIN TOTAL: 0.4 mg/dL (ref 0.3–1.2)
BLOOD UREA NITROGEN: <5 mg/dL — ABNORMAL LOW (ref 9–23)
CALCIUM: 9.9 mg/dL (ref 8.7–10.4)
CHLORIDE: 108 mmol/L — ABNORMAL HIGH (ref 98–107)
CO2: 27 mmol/L (ref 20.0–31.0)
CREATININE: 0.5 mg/dL — ABNORMAL LOW (ref 0.55–1.02)
EGFR CKD-EPI (2021) FEMALE: >90 mL/min/{1.73_m2} (ref >=60)
GLUCOSE RANDOM: 89 mg/dL (ref 70–179)
POTASSIUM: 3.7 mmol/L (ref 3.4–4.8)
PROTEIN TOTAL: 7.1 g/dL (ref 5.7–8.2)
SODIUM: 145 mmol/L (ref 135–145)

## 2023-03-10 LAB — PHOSPHORUS: PHOSPHORUS: 2.6 mg/dL (ref 2.4–5.1)

## 2023-03-10 LAB — URIC ACID: URIC ACID: 2.5 mg/dL — ABNORMAL LOW (ref 3.1–7.8)

## 2023-03-10 LAB — FERRITIN: FERRITIN: 37.6 ng/mL (ref 7.3–270.7)

## 2023-03-10 LAB — LACTATE DEHYDROGENASE: LACTATE DEHYDROGENASE: 155 U/L (ref 120–246)

## 2023-03-10 MED ADMIN — sucralfate (CARAFATE) oral suspension: 1 g | ORAL | @ 23:00:00

## 2023-03-10 MED ADMIN — ondansetron (ZOFRAN) injection 8 mg: 8 mg | INTRAVENOUS | @ 03:00:00

## 2023-03-10 MED ADMIN — HYDROmorphone (PF) (DILAUDID) injection Syrg 0.5 mg: .5 mg | INTRAVENOUS | @ 17:00:00 | Stop: 2023-03-24

## 2023-03-10 MED ADMIN — oxyCODONE (ROXICODONE) 5 mg/5 mL solution 15 mg: 15 mg | ORAL | @ 23:00:00 | Stop: 2023-03-14

## 2023-03-10 MED ADMIN — HYDROmorphone (PF) (DILAUDID) injection 1 mg: 1 mg | INTRAVENOUS | @ 21:00:00 | Stop: 2023-03-24

## 2023-03-10 MED ADMIN — sodium chloride (NS) 0.9 % flush 10 mL: 10 mL | INTRAVENOUS | @ 01:00:00

## 2023-03-10 MED ADMIN — ondansetron (ZOFRAN) injection 8 mg: 8 mg | INTRAVENOUS | @ 11:00:00

## 2023-03-10 MED ADMIN — sucralfate (CARAFATE) oral suspension: 1 g | ORAL | @ 17:00:00

## 2023-03-10 MED ADMIN — pantoprazole (Protonix) EC tablet 40 mg: 40 mg | ORAL | @ 19:00:00

## 2023-03-10 MED ADMIN — ondansetron (ZOFRAN) injection 8 mg: 8 mg | INTRAVENOUS | @ 17:00:00

## 2023-03-10 MED ADMIN — promethazine (PHENERGAN) 12.5 mg in sodium chloride (NS) 0.9 % 50 mL IVPB: 12.5 mg | INTRAVENOUS | @ 07:00:00

## 2023-03-10 MED ADMIN — oxyCODONE (ROXICODONE) 5 mg/5 mL solution 15 mg: 15 mg | ORAL | @ 05:00:00 | Stop: 2023-03-10

## 2023-03-10 MED ADMIN — HYDROmorphone (PF) (DILAUDID) injection Syrg 0.5 mg: .5 mg | INTRAVENOUS | @ 03:00:00 | Stop: 2023-03-12

## 2023-03-10 MED ADMIN — naloxegol (MOVANTIK) tablet 12.5 mg: 12.5 mg | ORAL | @ 14:00:00

## 2023-03-10 MED ADMIN — escitalopram oxalate (LEXAPRO) tablet 5 mg: 5 mg | ORAL | @ 14:00:00

## 2023-03-10 MED ADMIN — oxyCODONE (ROXICODONE) 5 mg/5 mL solution 15 mg: 15 mg | ORAL | @ 01:00:00 | Stop: 2023-03-14

## 2023-03-10 MED ADMIN — sucralfate (CARAFATE) oral suspension: 1 g | ORAL | @ 02:00:00

## 2023-03-10 MED ADMIN — HYDROmorphone (PF) (DILAUDID) injection Syrg 0.5 mg: .5 mg | INTRAVENOUS | @ 11:00:00 | Stop: 2023-03-10

## 2023-03-10 MED ADMIN — HYDROmorphone (PF) (DILAUDID) injection Syrg 0.5 mg: .5 mg | INTRAVENOUS | @ 06:00:00 | Stop: 2023-03-10

## 2023-03-10 MED ADMIN — oxyCODONE (ROXICODONE) 5 mg/5 mL solution 15 mg: 15 mg | ORAL | @ 09:00:00 | Stop: 2023-03-10

## 2023-03-10 MED ADMIN — famotidine (PEPCID) tablet 20 mg: 20 mg | ORAL | @ 01:00:00

## 2023-03-10 MED ADMIN — HYDROmorphone (PF) (DILAUDID) injection Syrg 0.5 mg: .5 mg | INTRAVENOUS | @ 19:00:00 | Stop: 2023-03-24

## 2023-03-10 MED ADMIN — oxyCODONE (ROXICODONE) 5 mg/5 mL solution 15 mg: 15 mg | ORAL | @ 14:00:00 | Stop: 2023-03-10

## 2023-03-10 MED ADMIN — sodium chloride (NS) 0.9 % flush 10 mL: 10 mL | INTRAVENOUS | @ 14:00:00

## 2023-03-10 MED ADMIN — acetaminophen (TYLENOL) tablet 1,000 mg: 1000 mg | ORAL | @ 19:00:00

## 2023-03-10 NOTE — Unmapped (Signed)
=  Department of Anesthesiology  Pain Medicine Division    Chronic Pain Consult Note      Requesting Attending Physician:  Hewitt Blade, MD  Service Requesting Consult:  Oncology/Hematology (MDE)    Assessment/Recommendations:  The patient was seen in consultation on request of Hewitt Blade, MD regarding assistance with pain management. The patient is not obtaining adequate pain relief on current medication regimen.     [name] [name] is a 22 year old female with a complex medical history, including DVT (RUE), [name] syndrome (post-colectomy), desmoid tumors, anemia, and severe protein-calorie malnutrition. She is followed at [facility] for chronic abdominal and right shoulder pain, consistent with cancer-associated pain. 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 [name] Riverss pain has worsened in recent months, likely due to intermittent intestinal obstructions related to her mesenteric desmoid tumors, which may cause intermittent bowel intussusception.    Her pain is further complicated by narcotic bowel syndrome, visceral hyperalgesia, and a positive feedback loop where increased opioid use exacerbates the pain. [name] Riverss most recent hospitalization was complicated by ileus, requiring NG tube, a prolonged inpatient rehab stay.     Yesterday, she presented for an outpatient MRI but experienced a flare-up of her chronic pain after completing one of the three planned studies. She was admitted to the [facility] emergency department for intractable acute on chronic pain.    Recently, [name] [name] had been seen by [name], who recently decided to switch her from Suboxone to [facility] 20 mcg/h patch and continue oxycodone 10 mg 3 times a day with flexibility to increase to 5 times a day for acute pain flares. However, over the past couple of weeks, she has experienced worsening abdominal and low back pain, along with an increase in stool frequency (up to 15 times a day with watery stools for 4 weeks) and some hematochezia.    Upon evaluation, [name] [name] reported uncontrolled pain, rating her pain as 10/10. She noted that oral oxycodone 10 mg was ineffective, and the night team had not administered IV hydromorphone, which has helped her in the past. She felt she had fallen behind on pain control, leading to worsening symptoms today.    In previous hospitalizations, [name] [name] had received ketamine infusions (5/24, 6/6, 8/1) and a lidocaine infusion (09/28/2022). However, there was inconsistency in reporting the effectiveness of these treatments, making it difficult to assess their true benefit.     I contacted [name] [name] primary pain provider, [name], who recommended the following treatment plan:  Continue Butrans patch at 20 mcg/h.  Increase oxycodone liquid to 15 mg every 6 hours as needed, or up to every 4 hours as needed for pain (if initial plan fails).  Gradually wean off IV hydromorphone.  Avoid ketamine infusion at this time.  This plan was discussed with [name] [name], who agreed to the recommendations. The primary admitting team was present during the discussion.    We have also advised Damyah to discuss with [name] to create an ED/Admission care plan to streamline her care, upon her presentation to the hospital in the future. To which [name] [name] and her mother agreeable to.     Recommendations:  -The chronic pain service is a consult service and does not place orders, just makes recommendations (except ketamine and lidocaine infusions)  -Please evaluate all patients on opioids for appropriateness of prescribing narcan at discharge.  The chronic pain service can assist with this.  Nasal narcan  covered by most insurance.  -Recommendations given apply to the current hospitalization and do not reflect long term recommendations.    Recommended to   -Continue Butrans patch 20 mcg/h q. Weekly  -Change oxycodone 10 mg tablet to oxycodone liquid 15 mg every 6 hours as needed and if needed can go up to every 4 hours as needed  -Reduce IV hydromorphone to 0.5-1 mg every 4 hours as needed  -Can use 1 time additional dose of IV hydromorphone 1 mg for severe breakthrough pain.  -Continue Tylenol 1 g 3 times daily    We will continue to follow.    Medications trialed during this admission (or pertinent in past): Hydromorphone IV, oxycodone oral, Butrans patch    Please make sure that patient has prescription of naloxone at discharge.  Nasal narcan for most insured (Nasal narcan 4mg /actuation, prescribe 1 kit, instructions at [email or web address]).  For uninsured, chronic pain can work to assist in finding an option.  OTC nasal narcan now available at most pharmacies for around $45.    History of Present Illness:    Reason for Consult: Acute on chronic abdominal pain    She was having outpatient MRI's but her pain increased and she was unable to finish her MRI's. She then presented to the ED with severe pain.      Patient describes the pain in her abdomen sharp stabbing and often cramping diffusely lower right side more pronounced also has chronic pain in her lower buttock area and some pain in her hip area.  Analgesia Evaluation:  Pain at minimum: 6/10  Pain at maximum: 10/10    Prior to admission, the patient was on home opiate medications.  Shehas been followed by a pain clinic.  The [place] CSRS was reviewed.    Allergies    Allergies   Allergen Reactions    Adhesive Tape-Silicones Hives and Rash     Paper tape and Tegederm ok. Biopatch causes blistering but has tolerated CHG Tegaderm    Ferrlecit [Sodium Ferric Gluconat-Sucrose] Swelling and Rash    Levofloxacin Swelling and Rash     Swelling in mouth, rash,     Methylnaltrexone      Per Patient: I lost bowel control, severe abdominal cramping, and elevated BP    Neomycin Swelling     Rxn after ear drops; ear swelling    Papaya Hives    Morphine Nausea And Vomiting    Zosyn [Piperacillin-Tazobactam] Itching and Rash     Red and itchy    Compazine [Prochlorperazine] Other (See Comments)     Extreme agitation    Iron Analogues     Iron Dextran Itching     Received iron dextran 06/08/22 over 12 hours, had itching and redness/flushing during the infusion and for a couple days after. Required IV benadryl w/flares between doses and ultimately treated w/IV methylpred for 2 days.     Latex, Natural Rubber Rash       Home Medications    (Not in a hospital admission)      Inpatient Medications  Current Facility-Administered Medications   Medication Dose Route Frequency Provider Last Rate Last Admin    acetaminophen (TYLENOL) tablet 1,000 mg  1,000 mg Oral Q8H SCH Gogerly-Moragoda, Ellene Route, MD   1,000 mg at 03/09/23 1738    buprenorphine 20 mcg/hour transdermal patch 1 patch  1 patch Transdermal Q7 Days Emogene Morgan, MD   1 patch at 03/08/23 2340    emollient combination no.92 (LUBRIDERM) lotion  Topical Q1H PRN Emogene Morgan, MD        escitalopram oxalate (LEXAPRO) tablet 5 mg  5 mg Oral Daily Melvia Heaps D, MD   5 mg at 03/09/23 0955    famotidine (PEPCID) tablet 20 mg  20 mg Oral Nightly Melvia Heaps D, MD   20 mg at 03/09/23 2017    fluticasone propionate (FLONASE) 50 mcg/actuation nasal spray 2 spray  2 spray Topical Q7 Days Emogene Morgan, MD   2 spray at 03/09/23 0249    hydrocortisone 2.5 % cream   Topical BID PRN Emogene Morgan, MD        HYDROmorphone (PF) (DILAUDID) injection Syrg 0.5 mg  0.5 mg Intravenous Q2H PRN Garnet Sierras, MD   0.5 mg at 03/09/23 2203    lidocaine (ASPERCREME) 4 % 1 patch  1 patch Transdermal Daily Gogerly-Moragoda, Ellene Route, MD        LORazepam (ATIVAN) tablet 1 mg  1 mg Oral TID PRN Emogene Morgan, MD        mirtazapine (REMERON) tablet 7.5 mg  7.5 mg Oral Nightly Gogerly-Moragoda, Ellene Route, MD        naloxegol (MOVANTIK) 12.5 mg tablet 12.5 mg  12.5 mg Oral Daily Melvia Heaps D, MD   12.5 mg at 03/09/23 1129    naloxone [facility]) injection 0.4 mg  0.4 mg Intravenous Once PRN Garnet Sierras, MD        ondansetron (ZOFRAN) injection 8 mg  8 mg Intravenous Q6H PRN Garnet Sierras, MD   8 mg at 03/09/23 2204    oxyCODONE (ROXICODONE) 5 mg/5 mL solution 15 mg  15 mg Oral Q4H SCH Gogerly-Moragoda, Ellene Route, MD   15 mg at 03/09/23 2017    promethazine (PHENERGAN) 12.5 mg in sodium chloride (NS) 0.9 % 50 mL IVPB  12.5 mg Intravenous Q12H PRN Gogerly-Moragoda, Ellene Route, MD        scopolamine (TRANSDERM-SCOP) 1 mg over 3 days topical patch 1 mg  1 patch Topical Q72H Feagins, Kayla D, MD   1 mg at 03/08/23 2338    sodium chloride (NS) 0.9 % flush 10 mL  10 mL Intravenous BID Melvia Heaps D, MD   10 mL at 03/08/23 2344    sodium chloride (NS) 0.9 % flush 10 mL  10 mL Intravenous BID Melvia Heaps D, MD   10 mL at 03/09/23 2018    sodium chloride (NS) 0.9 % flush 10 mL  10 mL Intravenous BID Melvia Heaps D, MD   10 mL at 03/09/23 2018    sodium chloride (NS) 0.9 % infusion  20 mL/hr Intravenous Continuous Emogene Morgan, MD   Held at 03/09/23 0250    sucralfate (CARAFATE) oral suspension  1 g Oral QID Melvia Heaps D, MD   1 g at 03/09/23 2129    triamcinolone (KENALOG) 0.1 % ointment   Topical BID PRN Emogene Morgan, MD         Current Outpatient Medications   Medication Sig Dispense Refill    acetaminophen (TYLENOL) 500 MG tablet Take 2 tablets (1,000 mg total) by mouth every six (6) hours as needed.      buprenorphine (BUTRANS) 20 mcg/hour PTWK transdermal patch Place 1 patch on the skin every seven (7) days. 4 patch 1    cholecalciferol, vitamin D3 25 mcg, 1,000 units,, 1,000 unit (25 mcg) tablet Take 1 tablet (25 mcg total) by mouth daily. 100 tablet 3  COURIERED MED OR SUPPLY OGSVEO 50 MG TABLET. 1 each 0    escitalopram oxalate (LEXAPRO) 5 MG tablet TAKE 1 TABLET (5 MG TOTAL) BY MOUTH DAILY. 90 tablet 1    famotidine (PEPCID) 20 MG tablet Take 1 tablet (20 mg total) by mouth nightly. 30 tablet 3    fluticasone propionate (FLONASE) 50 mcg/actuation nasal spray 1 spray into each nostril daily. 16 g 2    hydrocortisone 2.5 % cream Apply topically two (2) times a day as needed (hemorrhoids). 30 g 0    lidocaine (ASPERCREME) 4 % patch Place 2 patches on the skin daily. (Patient taking differently: Place 1 patch on the skin daily as needed.) 60 patch 0    LORazepam (ATIVAN) 0.5 MG tablet Take 2 tablets (1 mg total) by mouth Three (3) times a day as needed for anxiety.      mirtazapine (REMERON) 15 MG tablet Take 1 tablet (15 mg total) by mouth nightly. (Patient taking differently: Take 0.5 tablets (7.5 mg total) by mouth nightly.) 90 tablet 3    naloxegol (MOVANTIK) 12.5 mg tablet Take 1 tablet (12.5 mg total) by mouth daily. (Patient taking differently: Take 1 tablet (12.5 mg total) by mouth daily as needed.) 30 tablet 0    naloxone (NARCAN) 4 mg nasal spray One spray in either nostril once for known/suspected opioid overdose. May repeat every 2-3 minutes in alternating nostril til EMS arrives 2 each 0    nirogacestat (OGSIVEO) 50 mg tablet Take 3 tablets (150 mg total) by mouth two (2) times a day. Swallow tablets whole; do not break, crush, or chew. 180 tablet 5    ondansetron (ZOFRAN-ODT) 8 MG disintegrating tablet Dissolve 1 tablet (8 mg total) in the mouth every twelve (12) hours as needed for nausea.      oxyCODONE (ROXICODONE) 10 mg immediate release tablet Please take 1 tab every 4 to 6 hours as needed for pain. Fill on or after: 02/25/23 120 tablet 0    pimecrolimus (ELIDEL) 1 % cream Apply topically two (2) times a day as needed. To face as needed for rash      promethazine (PHENERGAN) 12.5 MG tablet Take 1 tablet (12.5 mg total) by mouth every six (6) hours as needed for nausea.      scopolamine (TRANSDERM-SCOP) 1 mg over 3 days Place 1 patch (1 mg total) on the skin every third day. 10 patch 2    sucralfate (CARAFATE) 100 mg/mL suspension Take 10 mL (1 g total) by mouth four (4) times a day. (Patient taking differently: Take 10 mL (1 g total) by mouth four (4) times a day as needed.) 1200 mL 11    triamcinolone (KENALOG) 0.1 % cream Apply topically two (2) times a day as needed. Apply to rash as needed      zinc oxide-cod liver oil (DESITIN 40%) 40 % Pste Apply topically daily as needed. 57 g 0         Past Medical History    Past Medical History:   Diagnosis Date    Abdominal pain     Acid reflux     occas    Anesthesia complication     itching, shaking, coldness; last few surgeries have gone much better    Cancer (CMS-HCC)     Cataract of right eye     COVID-19 virus infection 01/2019    Cyst of thyroid determined by ultrasound     monitoring    [name]  2 right forearm, 1 left thigh, 1 right scapula, 1 under left clavicle; multiple    Difficult intravenous access     [name] (familial adenomatous polyposis)     [name]     Gastric polyps     History of chemotherapy     last treatment approx 05/2019    History of colon polyps     History of COVID-19 01/2019    Iron deficiency anemia due to chronic blood loss     received iron infusion 11-2019    PONV (postoperative nausea and vomiting)     Rectal bleeding     Syncopal episodes     especially if becoming dehydrated           Past Surgical History    Past Surgical History:   Procedure Laterality Date    COLON SURGERY      cyroablation      cystis removal      desmoid removal      PR CLOSE ENTEROSTOMY,RESEC+ANAST N/A 10/09/2020    Procedure: ILEOSTOMY TAKEDOWN;  Surgeon: Mickle Asper, MD;  Location: OR [place];  Service: General Surgery    PR COLONOSCOPY W/BIOPSY SINGLE/MULTIPLE N/A 10/27/2012    Procedure: COLONOSCOPY, FLEXIBLE, PROXIMAL TO SPLENIC FLEXURE; WITH BIOPSY, SINGLE OR MULTIPLE;  Surgeon: Shirlyn Goltz Mir, MD;  Location: PEDS PROCEDURE ROOM [place];  Service: Gastroenterology    PR COLONOSCOPY W/BIOPSY SINGLE/MULTIPLE N/A 09/14/2013    Procedure: COLONOSCOPY, FLEXIBLE, PROXIMAL TO SPLENIC FLEXURE; WITH BIOPSY, SINGLE OR MULTIPLE;  Surgeon: Shirlyn Goltz Mir, MD;  Location: PEDS PROCEDURE ROOM [place];  Service: Gastroenterology    PR COLONOSCOPY W/BIOPSY SINGLE/MULTIPLE N/A 11/08/2014    Procedure: COLONOSCOPY, FLEXIBLE, PROXIMAL TO SPLENIC FLEXURE; WITH BIOPSY, SINGLE OR MULTIPLE;  Surgeon: Arnold Long Mir, MD;  Location: PEDS PROCEDURE ROOM [facility];  Service: Gastroenterology    PR COLONOSCOPY W/BIOPSY SINGLE/MULTIPLE N/A 12/26/2015    Procedure: COLONOSCOPY, FLEXIBLE, PROXIMAL TO SPLENIC FLEXURE; WITH BIOPSY, SINGLE OR MULTIPLE;  Surgeon: Arnold Long Mir, MD;  Location: PEDS PROCEDURE ROOM [facility];  Service: Gastroenterology    PR COLONOSCOPY W/BIOPSY SINGLE/MULTIPLE N/A 09/02/2017    Procedure: COLONOSCOPY, FLEXIBLE, PROXIMAL TO SPLENIC FLEXURE; WITH BIOPSY, SINGLE OR MULTIPLE;  Surgeon: Arnold Long Mir, MD;  Location: PEDS PROCEDURE ROOM [place];  Service: Gastroenterology    PR COLSC FLX W/REMOVAL LESION BY HOT BX FORCEPS N/A 08/27/2016    Procedure: COLONOSCOPY, FLEXIBLE, PROXIMAL TO SPLENIC FLEXURE; W/REMOVAL TUMOR/POLYP/OTHER LESION, HOT BX FORCEP/CAUTE;  Surgeon: Arnold Long Mir, MD;  Location: PEDS PROCEDURE ROOM [facility];  Service: Gastroenterology    PR COLSC FLX W/RMVL OF TUMOR POLYP LESION SNARE TQ N/A 02/25/2019    Procedure: COLONOSCOPY FLEX; W/REMOV TUMOR/LES BY SNARE;  Surgeon: Helyn Numbers, MD;  Location: [facility] PROCEDURES [facility] [facility];  Service: Gastroenterology    PR COLSC FLX W/RMVL OF TUMOR POLYP LESION SNARE TQ N/A 03/13/2020    Procedure: COLONOSCOPY FLEX; W/REMOV TUMOR/LES BY SNARE;  Surgeon: Helyn Numbers, MD;  Location: [facility];  Service: Gastroenterology    PR EXC SKIN BENIG 2.1-3 CM TRUNK,ARM,LEG Right 02/25/2017    Procedure: EXCISION, BENIGN LESION INCLUDE MARGINS, EXCEPT SKIN TAG, LEGS; EXCISED DIAMETER 2.1 TO 3.0 CM;  Surgeon: Clarene Duke, MD;  Location: CHILDRENS OR [facility];  Service: Plastics    PR EXC SKIN BENIG 3.1-4 CM TRUNK,ARM,LEG Right 02/25/2017    Procedure: EXCISION, BENIGN LESION INCLUDE MARGINS, EXCEPT SKIN TAG, ARMS; EXCISED DIAMETER 3.1 TO 4.0 CM;  Surgeon: Clarene Duke, MD;  Location:  CHILDRENS OR [place];  Service: Plastics    PR EXC SKIN BENIG >4 CM FACE,FACIAL Right 02/25/2017    Procedure: EXCISION, OTHER BENIGN LES INCLUD MARGIN, FACE/EARS/EYELIDS/NOSE/LIPS/MUCOUS MEMBRANE; EXCISED DIAM >4.0 CM;  Surgeon: Clarene Duke, MD;  Location: CHILDRENS OR [facility];  Service: Plastics    PR EXC TUMOR SOFT TISSUE LEG/ANKLE SUBQ 3+CM Right 08/05/2019    Procedure: EXCISION, TUMOR, SOFT TISSUE OF LEG OR ANKLE AREA, SUBCUTANEOUS; 3 CM OR GREATER;  Surgeon: Arsenio Katz, MD;  Location: MAIN OR [place];  Service: Plastics    PR EXC TUMOR SOFT TISSUE LEG/ANKLE SUBQ <3CM Right 08/05/2019    Procedure: EXCISION, TUMOR, SOFT TISSUE OF LEG OR ANKLE AREA, SUBCUTANEOUS; LESS THAN 3 CM;  Surgeon: Arsenio Katz, MD;  Location: [facility];  Service: Plastics    PR LAP, SURG PROCTECTOMY W J-POUCH N/A 08/10/2020    Procedure: ROBOTIC ASSISTED LAPAROSCOPIC PROCTOCOLECTOMY, ILEAL J POUCH, WITH OSTOMY;  Surgeon: Mickle Asper, MD;  Location: OR [place];  Service: General Surgery    PR NDSC EVAL INTSTINAL POUCH DX W/COLLJ SPEC SPX N/A 01/23/2021    Procedure: ENDO EVAL SM INTEST POUCH; DX;  Surgeon: Modena Nunnery, MD;  Location: [facility]ED[facility] [facility];  Service: Gastroenterology    PR NDSC EVAL INTSTINAL POUCH DX W/COLLJ SPEC SPX N/A 08/27/2021    Procedure: ENDO EVAL SM INTEST POUCH; DX;  Surgeon: Hunt Oris, MD;  Location: [facility]URES [facility];  Service: Gastroenterology    PR NDSC EVAL INTSTINAL POUCH DX W/COLLJ SPEC SPX N/A 12/09/2021    Procedure: ENDO EVAL SM INTEST POUCH; DX;  Surgeon: Vidal Schwalbe, MD;  Location: GI PROCEDURES [facility];  Service: Gastroenterology    PR NDSC EVAL INTSTINAL POUCH DX W/COLLJ [facility] SPX Left 04/09/2022    Procedure: ENDO EVAL SM INTEST POUCH; DX;  Surgeon: Modena Nunnery, MD;  Location: [facility] [place] [facility];  Service: Gastroenterology    PR NDSC EVAL INTSTINAL POUCH DX W/COLLJ SPEC SPX N/A 08/05/2022    Procedure: ENDO EVAL SM INTEST POUCH; DX;  Surgeon: Modena Nunnery, MD;  Location: GI PROCEDURES [facility];  Service: Gastroenterology    PR NDSC EVAL INTSTINAL POUCH W/BX SINGLE/MULTIPLE N/A 01/20/2022    Procedure: ENDOSCOPIC EVAL OF SMALL INTESTINAL POUCH; DIAGNOSTIC, No biopsies;  Surgeon: Andrey Farmer, MD;  Location: GI PROCEDURES [facility];  Service: Gastroenterology    PR NDSC EVAL INTSTINAL POUCH W/BX SINGLE/MULTIPLE N/A 02/13/2022    Procedure: ENDOSCOPIC EVAL OF SMALL INTESTINAL POUCH; DIAGNOSTIC, WITH BIOPSY;  Surgeon: Bronson Curb, MD;  Location: GI PROCEDURES [facility];  Service: Gastroenterology    PR UNLISTED PROCEDURE SMALL INTESTINE  01/23/2021    Procedure: UNLISTED PROCEDURE, SMALL INTESTINE;  Surgeon: Modena Nunnery, MD;  Location: [facility] [place] [facility];  Service: Gastroenterology    PR UNLISTED PROCEDURE SMALL INTESTINE  02/13/2022    Procedure: UNLISTED PROCEDURE, SMALL INTESTINE;  Surgeon: Bronson Curb, MD;  Location: GI PROCEDURES [facility];  Service: Gastroenterology    PR UPPER GI ENDOSCOPY,BIOPSY N/A 10/27/2012    Procedure: UGI ENDOSCOPY; WITH BIOPSY, SINGLE OR MULTIPLE;  Surgeon: Shirlyn Goltz Mir, MD;  Location: PEDS PROCEDURE ROOM [facility];  Service: Gastroenterology    PR UPPER GI ENDOSCOPY,BIOPSY N/A 09/14/2013    Procedure: UGI ENDOSCOPY; WITH BIOPSY, SINGLE OR MULTIPLE;  Surgeon: Shirlyn Goltz Mir, MD;  Location: PEDS PROCEDURE ROOM [facility];  Service: Gastroenterology    PR UPPER GI ENDOSCOPY,BIOPSY N/A 11/08/2014    Procedure:  UGI ENDOSCOPY; WITH BIOPSY, SINGLE OR MULTIPLE;  Surgeon: Arnold Long Mir, MD;  Location: PEDS PROCEDURE ROOM [facility];  Service: Gastroenterology    PR UPPER GI ENDOSCOPY,BIOPSY N/A 12/26/2015    Procedure: UGI ENDOSCOPY; WITH BIOPSY, SINGLE OR MULTIPLE;  Surgeon: Arnold Long Mir, MD;  Location: PEDS PROCEDURE ROOM [facility];  Service: Gastroenterology    PR UPPER GI ENDOSCOPY,BIOPSY N/A 08/27/2016    Procedure: UGI ENDOSCOPY; WITH BIOPSY, SINGLE OR MULTIPLE;  Surgeon: Arnold Long Mir, MD;  Location: PEDS PROCEDURE ROOM [facility];  Service: Gastroenterology    PR UPPER GI ENDOSCOPY,BIOPSY N/A 09/02/2017    Procedure: UGI ENDOSCOPY; WITH BIOPSY, SINGLE OR MULTIPLE;  Surgeon: Arnold Long Mir, MD;  Location: PEDS PROCEDURE ROOM [facility];  Service: Gastroenterology    PR UPPER GI ENDOSCOPY,BIOPSY N/A 03/13/2020    Procedure: UGI ENDOSCOPY; WITH BIOPSY, SINGLE OR MULTIPLE;  Surgeon: Helyn Numbers, MD;  Location: [facility] [place] [facility];  Service: Gastroenterology    PR UPPER GI ENDOSCOPY,BIOPSY N/A 09/05/2021    Procedure: UGI ENDOSCOPY; WITH BIOPSY, SINGLE OR MULTIPLE;  Surgeon: Wendall Papa, MD;  Location: GI PROCEDURES [facility];  Service: Gastroenterology    PR UPPER GI ENDOSCOPY,DIAGNOSIS N/A 01/20/2022    Procedure: UGI ENDO, INCLUDE ESOPHAGUS, STOMACH, & DUODENUM &/OR JEJUNUM; DX W/WO COLLECTION SPECIMN, BY BRUSH OR WASH;  Surgeon: Andrey Farmer, MD;  Location: GI PROCEDURES [facility];  Service: Gastroenterology    TUMOR REMOVAL      multiple-head, neck, back, hand, right flank, multiple           Family History    Family History   Problem Relation Age of Onset    No Known Problems Mother     No Known Problems Father     No Known Problems Sister     No Known Problems Brother     Stroke Maternal Grandmother     Other Maternal Grandmother         benign lesions of liver and pancreas, further details unknown    Cancer Maternal Grandmother     Diabetes Maternal Grandmother     Hypertension Maternal Grandmother     Thyroid disease Maternal Grandmother     Arthritis Maternal Grandfather     Asthma Maternal Grandfather     COPD Paternal Grandmother         Deceased    Miscarriages / Stillbirths Paternal Grandmother     Alcohol abuse Paternal Grandfather         Deceased    No Known Problems Maternal Aunt     No Known Problems Maternal Uncle     No Known Problems Paternal Aunt     No Known Problems Paternal Uncle     Anesthesia problems Neg Hx     Broken bones Neg Hx     Cancer Neg Hx     Clotting disorder Neg Hx     Collagen disease Neg Hx     Diabetes Neg Hx     Dislocations Neg Hx     Fibromyalgia Neg Hx     Gout Neg Hx     Hemophilia Neg Hx     Osteoporosis Neg Hx     Rheumatologic disease Neg Hx     Scoliosis Neg Hx     Severe sprains Neg Hx     Sickle cell anemia Neg Hx     Spinal Compression Fracture Neg Hx     Melanoma Neg Hx     Basal cell carcinoma Neg Hx  Squamous cell carcinoma Neg Hx          Social History:  Social History     Tobacco Use   Smoking Status Never    Passive exposure: Past   Smokeless Tobacco Never     Social History     Substance and Sexual Activity   Alcohol Use Never     Social History     Substance and Sexual Activity   Drug Use Never       Lab Results   Component Value Date    CREATININE 0.46 (L) 03/09/2023       Lab Results   Component Value Date    ALKPHOS 95 03/09/2023    BILITOT 0.4 03/09/2023    BILIDIR 0.10 10/29/2022    PROT 7.4 03/09/2023    ALBUMIN 3.9 03/09/2023    ALT 15 03/09/2023    AST 22 03/09/2023       Encounter Date: 03/08/23   ECG 12 Lead   Result Value    EKG Systolic BP     EKG Diastolic BP     EKG Ventricular Rate 82    EKG Atrial Rate 82    EKG P-R Interval 142    EKG QRS Duration 88    EKG Q-T Interval 382    EKG QTC Calculation 446    EKG Calculated P Axis 53    EKG Calculated R Axis 39    EKG Calculated T Axis 20    QTC Fredericia 424    Narrative    NORMAL SINUS RHYTHM  NONSPECIFIC T WAVE ABNORMALITY  ABNORMAL ECG  WHEN COMPARED WITH ECG OF 02-Nov-2022 08:33,  NONSPECIFIC T WAVE ABNORMALITY NOW EVIDENT IN ANTERIOR LEADS  Confirmed by Pollyann Kennedy ([id number]) on 03/09/2023 7:31:09 PM       No results found for requested labs within last 30 days.       Objective:     Vital Signs  Temp:  [36.4 ??C (97.5 ??F)-37 ??C (98.6 ??F)] 36.9 ??C (98.4 ??F)  Heart Rate:  [62-88] 78  SpO2 Pulse:  [72-96] 96  Resp:  [10-18] 18  BP: (120-128)/(64-78) 128/75  MAP (mmHg):  [81-92] 92  SpO2:  [92 %-99 %] 99 %  BMI (Calculated):  [19.14] 19.14    Physical Exam    GENERAL:  Well developed, well-nourished female and is in no apparent distress.   HEAD/NECK:    Reveals normocephalic/atraumatic.   CARDIOVASCULAR:   Regular rate  LUNGS:   Normal work of breathing, no supplemental 02  EXTREMITIES:  Warm, no clubbing, cyanosis, or edema was noted.  NEUROLOGIC:    The patient was alert and oriented times four with normal language, attention, cognition and memory. Cranial nerve exam was grossly normal.  MUSCULOSKELETAL:    Moving all 4 extremities.    SKIN:  No obvious rashes lesions or erythema  PSY:  Appropriate affect and mood.      Problem List    Principal Problem:    Chronic pain due to neoplasm  Active Problems:    [name]    Generalized anxiety disorder with panic attacks    GI bleeding    MDD (major depressive disorder)    Ileus (CMS-HCC)    Dizziness    Fatigue    Hypercalcemia       Medical Decision Making    Problems Addressed:  1 or more chronic illnesses with severe exacerbation, progression, or side effects of treatment (high) pain up to 10/10  Amount/Complexity:  Reviewed external records:  Oncology progress 03/09/23, ED 03/08/23 and [place] PDMP (today on day of visit)  Reviewed results:  labs 12/30-cr and LFTs  Ordered tests: None  Assessment requiring independent historian: None  Discussion of management/test results with medical professional: Primary team, and pain physician, [name]  Independent interpretation of test: None    Risk  High: Drug therapy requiring intensive monitoring for toxicity, Decision regarding hospitalization, Elective Major Surgery w/ risk factors, Emergency Major Surgery, Decision to DNR or de-escalate care because of poor prognosis, parental controlled substances- IV opioids, discussion of IV ketamine      I was immediately available during all portions of the patient's visit and discussed the assessment and plan with the fellow.  I reviewed the chart and the fellow's note and agree with the findings and plan.  I am very familiar with Ellieanna from previous care and discussion with her primary pain physician, [name].  Criss Rosales, MD

## 2023-03-10 NOTE — Unmapped (Signed)
Adult Nutrition Assessment Note    Visit Type: MD Consult  Reason for Visit: Assessment (Nutrition)    NUTRITION INTERVENTIONS and RECOMMENDATION     Continue with current diet: House  Recommend adding Pediasure TID  Monitor PO intake and record % eaten in [employer] intake: assist with meal ordering, tray set up, and feeding as needed   Monitor electrolytes and replete as needed   Weigh patient weekly   If enteral nutrition is recommended: Recommend Osmolite 1.5 Cal at goal rate 60 mL/hr. This provides 1890 kcals, 80 g protein, 257 g carbohydrate, 62 g fat, 0 g fiber, 961 mL free water, and meets 126% USRDI.  Recommend 150 mL FWF q4h to meet fluid needs, or per MD, minimum of 30 mL Q4H for tube patency.  Initiate at 20 mL/hr and increase by 10 Q4H to goal rate of 60 mL/hr.    NUTRITION ASSESSMENT     Current nutrition therapy is appropriate and progressing toward meeting nutritional needs at this time.   Patient would benefit from start of oral supplement to better meet nutritional needs.  If patient is unable to meet her nutritional needs via po intake, recommend initiating enteral nutrition.    NUTRITIONALLY RELEVANT DATA     HPI & PMH:   '[name] [name] is a 22 y.o. female whose presentation is complicated by [name] c/b desmoid fibromatomsis, recurrent ileus, GI bleeeds, DVT, MDD, and GAD that presented to [facility] with refractory pain iso tumor burden.    Nutrition History:   RD met with patient and patient's mother at bedside. Patient endorses recent weight loss. Patient has been trying to eat regularly, but has found it difficult to meet her needs at times. She does consume nutritional supplements at home and has been on both enteral nutrition and TPN over the past year. Patient would like to have nutritional supplements provided during admission.    Medications:  Nutritionally pertinent medications reviewed and evaluated for potential food and/or medication interactions.   Include: Pepcid, remeron, oxycodone, sucralfate    Labs:   Nutritionally pertinent labs reviewed and include K+: 3.2 mmol/L    Nutritional Needs:   Daily Estimated Nutrient Needs:  Energy: 1610-9604 kcals 30-35 kcal/kg using last recorded weight, 52.2 kg (03/10/23 1158)]  Protein: 63-78 gm [1.2-1.5 gm/kg using last recorded weight, 52.2 kg (03/10/23 1158)]  Carbohydrate:   [no restriction]  Fluid: 5409-8119 mL [1 mL/kcal (maintenance)]    Anthropometric Data:  Height: 165.1 cm (5' 5)   Admission weight: 52.2 kg (115 lb)  Last recorded weight: 52.2 kg (115 lb)  Date of last recorded weight: 12/30  IBW: 56.75 kg  BMI: Body mass index is 19.14 kg/m??.   Usual Body Weight:  120's  Weight Assessment: 20 lb weight loss in 3 months, 14.8% weight loss, clinically significant.   Wt Readings from Last 10 Encounters:   03/09/23 52.2 kg (115 lb)   02/24/23 54.1 kg (119 lb 4.8 oz)   01/23/23 57.4 kg (126 lb 9.6 oz)   01/21/23 58 kg (127 lb 12.8 oz)   01/14/23 58 kg (127 lb 12.8 oz)   12/16/22 58.5 kg (129 lb)   12/16/22 58.5 kg (129 lb)   12/08/22 61.3 kg (135 lb 3.2 oz)   11/14/22 61 kg (134 lb 8 oz)   09/24/22 62.4 kg (137 lb 9.6 oz)     Malnutrition Assessment:  Malnutrition Assessment using AND/ASPEN or GLIM Clinical Characteristics:    Non-severe (Moderate) Protein-Calorie Malnutrition in  the context of chronic illness (03/10/23 1204)  Energy Intake: < 75% of estimated energy requirement for > or equal to 1 month  Interpretation of Wt. Loss: > or equal to 7.5% x 3 month  Fat Loss: Clinical criterion not met  Muscle Loss: Clinical criterion not met  Malnutrition Score: 2            Nutrition Focused Physical Exam:  Nutrition Focused Physical Exam:  Fat Areas Examined  Orbital: Mild loss  Upper Arm: No loss  Thoracic: No loss      Muscle Areas Examined  Temple: No loss  Clavicle: Mild loss  Acromion: No loss  Scapular: No loss  Dorsal Hand: No loss  Patellar: No loss  Anterior Thigh: No loss  Posterior Calf: No loss              Nutrition Evaluation  Overall Impressions: No fat loss, No muscle loss (03/10/23 1204)  Nutrition Designation: Normal weight (BMI 18.50 - 24.99 kg/m2) (03/10/23 1204)     Care plan:  Completed    Current Nutrition:  Oral intake   Nutrition Orders            Nutrition Therapy Regular/House starting at 12/29 2236          Nutritionally Pertinent Allergies, Intolerances, Sensitivities, and/or Cultural/Religious Restrictions:  include papaya    GOALS and EVALUATION     Patient to meet 75% or greater of nutritional needs via combination of meals, snacks, and/or oral supplements within hospital admission.  - New    Motivation, Barriers, and Compliance:  Evaluation of motivation, barriers, and compliance completed. No concerns identified at this time.     Discharge Planning:   Monitor for potential discharge needs with multi-disciplinary team.     Follow-Up Parameters:   1-2 times per week (and more frequent as indicated)    Tania Perrott C. Azucena Kuba, MPH, RD, LDN  Pager: [phone]  Cross Coverage Dietitian

## 2023-03-10 NOTE — Unmapped (Addendum)
Report called and given to receiving RN, Margaretmary Lombard, on 4 ONC.  Patient refuses to be tested for C diff.

## 2023-03-10 NOTE — Unmapped (Signed)
Care Management  Initial Transition Planning Assessment      Patient lives with Mother and Father in [place] [facility] [place]) in a 3 level home with 2+1 steps to enter.  At baseline patient is dependent with ADLs - needs  stand by assiste with bathing dressing and mobilising. She does not receive home health services. DME is Environmental consultant, rollator, wheelchair, commode. Family will provide transportation and will assist with basic care at home.      Type of Residence: Mailing Address:  [address] [place]  [place] [address]  Contacts: Accompanied by: Family member  Password: declines  Patient Phone Number: [phone] (home) [phone] (work)        Medical Provider(s): Jarold Motto, [facility]  Reason for Admission: Admitting Diagnosis:  Hematochezia [K92.1]  Cancer associated pain [G89.3]  Past Medical History:   has a past medical history of Abdominal pain, Acid reflux, Anesthesia complication, Cancer (CMS-HCC), Cataract of right eye, COVID-19 virus infection (01/2019), Cyst of thyroid determined by ultrasound, [name], Difficult intravenous access, [name] (familial adenomatous polyposis), [name], Gastric polyps, History of chemotherapy, History of colon polyps, History of COVID-19 (01/2019), Ileus (CMS-HCC) (03/16/2022), Iron deficiency anemia due to chronic blood loss, PONV (postoperative nausea and vomiting), Rectal bleeding, and Syncopal episodes.  Past Surgical History:   has a past surgical history that includes desmoid removal; cystis removal; Tumor removal; pr upper gi endoscopy,biopsy (N/A, 10/27/2012); pr colonoscopy w/biopsy single/multiple (N/A, 10/27/2012); pr upper gi endoscopy,biopsy (N/A, 09/14/2013); pr colonoscopy w/biopsy single/multiple (N/A, 09/14/2013); pr upper gi endoscopy,biopsy (N/A, 11/08/2014); pr colonoscopy w/biopsy single/multiple (N/A, 11/08/2014); pr upper gi endoscopy,biopsy (N/A, 12/26/2015); pr colonoscopy w/biopsy single/multiple (N/A, 12/26/2015); pr upper gi endoscopy,biopsy (N/A, 08/27/2016); pr colsc flx w/removal lesion by hot bx forceps (N/A, 08/27/2016); pr exc skin benig >4 cm face,facial (Right, 02/25/2017); pr exc skin benig 3.1-4 cm trunk,arm,leg (Right, 02/25/2017); pr exc skin benig 2.1-3 cm trunk,arm,leg (Right, 02/25/2017); pr upper gi endoscopy,biopsy (N/A, 09/02/2017); pr colonoscopy w/biopsy single/multiple (N/A, 09/02/2017); pr colsc flx w/rmvl of tumor polyp lesion snare tq (N/A, 02/25/2019); pr exc tumor soft tissue leg/ankle subq 3+cm (Right, 08/05/2019); pr exc tumor soft tissue leg/ankle subq <3cm (Right, 08/05/2019); pr colsc flx w/rmvl of tumor polyp lesion snare tq (N/A, 03/13/2020); pr upper gi endoscopy,biopsy (N/A, 03/13/2020); cyroablation; pr lap, surg proctectomy w j-pouch (N/A, 08/10/2020); pr close enterostomy,resec+anast (N/A, 10/09/2020); pr ndsc eval intstinal pouch dx w/collj spec spx (N/A, 01/23/2021); pr unlisted procedure small intestine (01/23/2021); pr ndsc eval intstinal pouch dx w/collj spec spx (N/A, 08/27/2021); pr upper gi endoscopy,biopsy (N/A, 09/05/2021); pr ndsc eval intstinal pouch dx w/collj spec spx (N/A, 12/09/2021); pr upper gi endoscopy,diagnosis (N/A, 01/20/2022); pr ndsc eval intstinal pouch w/bx single/multiple (N/A, 01/20/2022); pr ndsc eval intstinal pouch w/bx single/multiple (N/A, 02/13/2022); pr unlisted procedure small intestine (02/13/2022); pr ndsc eval intstinal pouch dx w/collj spec spx (Left, 04/09/2022); Colon surgery; and pr ndsc eval intstinal pouch dx w/collj spec spx (N/A, 08/05/2022).   Previous admit date: 11/19/2022    Primary Insurance- Payor: BCBS / Plan: BCBS BLUE OPTIONS/PPO/ADV ([place] ONLY) / Product Type: *No Product type* /   Secondary Insurance - None  Prescription Coverage - N/A  Preferred Pharmacy - [id number] [place], [place] - [address] AT [address] [facility] [address]  [facility]  [facility]  [phone] - [place], [place] - [address]  [facility] [place] [facility]  [facility]  Transportation home: Radiographer, therapeutic assessed the patient by : In person interview with patient, In person interview with family, Discussion with Clinical Care team, Medical record review  Orientation Level: Oriented X4  Functional level prior to admission: Independent  Reason for referral: Discharge Planning    Contact/Decision Maker  Extended Emergency Contact Information  Primary Emergency Contact: Neglia,[name]   [place]  Home Phone: [phone]  Mobile Phone: [phone]  Relation: Mother  Secondary Emergency Contact: Leibold,Jeff  Mobile Phone: [phone]  Relation: Father    Legal Next of Kin / Guardian / POA / Advance Directives     HCDM ([place] policy, no known patient preference): Bunton,[name] - Mother - [phone]    HCDM, back-up (If primary HCDM is unavailable): Matthies,Jeff - Father - [phone]    Advance Directive (Medical Treatment)  Does patient have an advance directive covering medical treatment?: Patient does not have advance directive covering medical treatment.  Reason patient does not have an advance directive covering medical treatment:: Patient does not wish to complete one at this time.    Health Care Decision Maker [HCDM] (Medical & Mental Health Treatment)  Healthcare Decision Maker: HCDM documented in the HCDM/Contact Info section.  Information offered on HCDM, Medical & Mental Health advance directives:: Patient given information.    Advance Directive (Mental Health Treatment)  Does patient have an advance directive covering mental health treatment?: Patient does not have advance directive covering mental health treatment.  Reason patient does not have an advance directive covering mental health treatment:: Patient does not wish to complete one at this time.    Readmission Information    Have you been hospitalized in the last 30 days?: No  Name of Hospital: [facility]        Did the following happen with your discharge?       Patient Information  Lives with: Parent    Type of Residence: Private residence (3 level home with 2 + 1 step to enter)        Location/Detail: [address] [place] [address]    Support Systems/Concerns: Parent, Family Members    Responsibilities/Dependents at home?: No    Home Care services in place prior to admission?: No        Equipment Currently Used at Home: walker, standard, other (see comments), wheelchair, manual, commode chair (rollator)     Currently receiving outpatient dialysis?: No     Financial Information     Need for financial assistance?: No     Social Determinants of Health  Social Drivers of Health     Food Insecurity: No Food Insecurity (03/10/2023)    Hunger Vital Sign     Worried About Running Out of Food in the Last Year: Never true     Ran Out of Food in the Last Year: Never true   Internet Connectivity: Not on file   Housing/Utilities: Low Risk  (03/10/2023)    Housing/Utilities     Within the past 12 months, have you ever stayed: outside, in a car, in a tent, in an overnight shelter, or temporarily in someone else's home (i.e. couch-surfing)?: No     Are you worried about losing your housing?: No     Within the past 12 months, have you been unable to get utilities (heat, electricity) when it was really needed?: No   Tobacco Use: Low Risk  (03/10/2023)  Patient History     Smoking Tobacco Use: Never     Smokeless Tobacco Use: Never     Passive Exposure: Past   Transportation Needs: No Transportation Needs (03/10/2023)    PRAPARE - Transportation     Lack of Transportation (Medical): No     Lack of Transportation (Non-Medical): No   Alcohol Use: Not on file   Interpersonal Safety: Not on file   Physical Activity: Not on file   Intimate Partner Violence: Not on file   Stress: Not on file   Substance Use: Not on file (01/12/2023)   Social Connections: Not on file   Financial Resource Strain: Low Risk  (03/10/2023)    Overall Financial Resource Strain (CARDIA)     Difficulty of Paying Living Expenses: Not very hard   Depression: Not at risk (01/25/2022)    Received from [facility]    PHQ-2   Health Literacy: Medium Risk (12/10/2022)    Health Literacy     : Rarely       Complex Discharge Information    Is patient identified as a difficult/complex discharge?: No       Discharge Needs Assessment  Concerns to be Addressed: discharge planning    Clinical Risk Factors: Principal Diagnosis: Cancer, Stroke, COPD, Heart Failure, AMI, Pneumonia, Joint Replacment, Functional Limitations    Barriers to taking medications: No    Prior overnight hospital stay or ED visit in last 90 days: Yes         Patient's Choice of Community Agency(s): no preference    Anticipated Changes Related to Illness: other (see comments) (Likely need time to recover before resuming usual activities.)    Equipment Needed After Discharge: other (see comments) (CM will follow for any DME needs.)    Discharge Facility/Level of Care Needs: other (see comments) (CM will follow for DC needs.)    Readmission  Risk of Unplanned Readmission Score: UNPLANNED READMISSION SCORE: 22.44%  Predictive Model Details          22%  Factor Value    Calculated 03/10/2023 12:05 27% Number of active inpatient medication orders 44    Excel Risk of Unplanned Readmission Model 22% Number of hospitalizations in last year 5     8% ECG/EKG order present in last 6 months     7% Encounter of ten days or longer in last year present     7% Diagnosis of electrolyte disorder present     6% Imaging order present in last 6 months     5% Phosphorous result present     5% Number of ED visits in last six months 1     4% Diagnosis of deficiency anemia present     4% Active corticosteroid inpatient medication order present     2% Future appointment scheduled     2% Age 22     1% Current length of stay 1.563 days     1% Active ulcer inpatient medication order present      Readmitted Within the Last 30 Days? (No if blank)   Patient at risk for readmission?: No    Discharge Plan  Screen findings are: Care Manager reviewed the plan of the patient's care with the Multidisciplinary Team. No discharge planning needs identified at this time. Care Manager will continue to manage plan and monitor patient's progress with the team.    Expected Discharge Date: 03/12/2023    Expected Transfer from Critical Care:         Patient  and/or family were provided with choice of facilities / services that are available and appropriate to meet post hospital care needs?: Other (Comment) (Choice of facilities/services will be provided if deemed medically necessary.)       Initial Assessment complete?: Yes

## 2023-03-10 NOTE — Unmapped (Addendum)
PHYSICAL THERAPY  Evaluation (03/10/23 0835)          Patient Name:  [name] [name]       Medical Record Number: [id number]   Date of Birth: 2000/05/24  Sex: Female        Post-Discharge Physical Therapy Recommendations:  PT Post Acute Discharge Recommendations: 3x weekly, Architectural technologist Recommendation  PT DME Recommendations: None (Pt owns RW, rollator, w/c.)          Treatment Diagnosis: Generalized muscle weakness, Unsteadiness on feet        Activity Tolerance: Tolerated treatment well     ASSESSMENT  Problem List: Decreased strength, Impaired balance, Pain, Decreased endurance, Decreased mobility, Fall risk      Assessment : [name] [name] is a 22 y.o. female whose presentation is complicated by [name] c/b desmoid fibromatomsis, recurrent ileus, GI bleeding, complex pain, PTSD secondary to chronic medical condition and multiple healthcare interactions who presented to [facility] with refractory pain in setting of large tumor burden. Pt presents to PT with LE weakness, impaired balance and functional mobility. Pt able to ambulate 250 ft with RW, CGA. Gait steady but LE weakness noted.  Given CLOF, recommend 3x/week post acute PT as a community ambulator. After a review of the personal factors, comorbidities, clinical presentation, and examination of the number of affected body systems, the patient presents as a low complexity case.      Today's Interventions: AM-PAC 20/24. Eval, mobility, transfers, gait. Education: ambulation with LRAD and assist, POC            PLAN  Planned Frequency of Treatment: Plan of Care Initiated: 03/10/23  1-2x per day Weekly Frequency: 3-4 days per week  Planned Treatment Duration: 03/24/23     Planned Interventions: Education (Patient/Family/Caregiver), Gait training, Self-care / Home Management training, Therapeutic Exercise, Therapeutic Activity     Goals:   Patient and Family Goals: to be able to ambulate with no AD     SHORT GOAL #1: Sit <> stand with no AD, SBA               Time Frame : 2 weeks  SHORT GOAL #2: Ambulate > 200 ft with LRAD, modI              Time Frame : 2 weeks                         Long Term Goal #1: AM-PAC 23/24  Time Frame: 2 months     Prognosis:  Good  Positive Indicators: CLOF, age, motivation, family support  Barriers to Discharge: Inaccessible home environment, Inability to safely perform ADLS, Functional strength deficits, Endurance deficits, Pain     SUBJECTIVE  Communication Preference: Verbal,     Patient reports: agreeable to PT     Services patient receives prior to admission: PT  Prior Functional Status: Pt reports she ambulates household distances with RW/rollator and uses a w/c for longer distances. Has fall 2-3x/week when ambulating with no AD. Pt reports difficulty to negotiate stairs and occasionally scoot on her butt. Per previous note: Patient has been in the hospital almost 200 days since June 2023. She has only been home for short amounts of time between hospital stays. She has had HH/OP therapy following these stays, but she hasn't had enough time to fully recover causing her endurance, strength, and functional mobility to decline slowly over time. Patient was in nursing school, had to stop  and come home 2* medical issues. She has been pursuing a different degree online, but would like to get back to nursing school. Patient lives with her parents in a three level home (only needs to get to the second floor), her bed/bath are on the second floor, 14 steps with rail on R going up. Patient was able to asc/des stairs, however she has a medical recliner on the first floor that she can sleep in if needed. Patient was unable to be on her feet standing/walking more than 20-80min so she would use a rollator when out in the community. Patient also has a service dog that she would love to have vist her.  Equipment available at home: Rollator, Rolling walker, [employer]        Past Medical History:   Diagnosis Date Abdominal pain     Acid reflux     occas    Anesthesia complication     itching, shaking, coldness; last few surgeries have gone much better    Cancer (CMS-HCC)     Cataract of right eye     COVID-19 virus infection 01/2019    Cyst of thyroid determined by ultrasound     monitoring    [name]     2 right forearm, 1 left thigh, 1 right scapula, 1 under left clavicle; multiple    Difficult intravenous access     [name] (familial adenomatous polyposis)     [name]     Gastric polyps     History of chemotherapy     last treatment approx 05/2019    History of colon polyps     History of COVID-19 01/2019    Ileus (CMS-HCC) 03/16/2022    Iron deficiency anemia due to chronic blood loss     received iron infusion 11-2019    PONV (postoperative nausea and vomiting)     Rectal bleeding     Syncopal episodes     especially if becoming dehydrated            Social History     Tobacco Use    Smoking status: Never     Passive exposure: Past    Smokeless tobacco: Never   Substance Use Topics    Alcohol use: Never       Past Surgical History:   Procedure Laterality Date    COLON SURGERY      cyroablation      cystis removal      desmoid removal      PR CLOSE ENTEROSTOMY,RESEC+ANAST N/A 10/09/2020    Procedure: ILEOSTOMY TAKEDOWN;  Surgeon: Mickle Asper, MD;  Location: OR ;  Service: General Surgery    PR COLONOSCOPY W/BIOPSY SINGLE/MULTIPLE N/A 10/27/2012    Procedure: COLONOSCOPY, FLEXIBLE, PROXIMAL TO SPLENIC FLEXURE; WITH BIOPSY, SINGLE OR MULTIPLE;  Surgeon: Shirlyn Goltz Mir, MD;  Location: PEDS PROCEDURE ROOM [place];  Service: Gastroenterology    PR COLONOSCOPY W/BIOPSY SINGLE/MULTIPLE N/A 09/14/2013    Procedure: COLONOSCOPY, FLEXIBLE, PROXIMAL TO SPLENIC FLEXURE; WITH BIOPSY, SINGLE OR MULTIPLE;  Surgeon: Shirlyn Goltz Mir, MD;  Location: PEDS PROCEDURE ROOM [place];  Service: Gastroenterology    PR COLONOSCOPY W/BIOPSY SINGLE/MULTIPLE N/A 11/08/2014    Procedure: COLONOSCOPY, FLEXIBLE, PROXIMAL TO SPLENIC FLEXURE; WITH BIOPSY, SINGLE OR MULTIPLE;  Surgeon: Arnold Long Mir, MD;  Location: PEDS PROCEDURE ROOM [facility];  Service: Gastroenterology    PR COLONOSCOPY W/BIOPSY SINGLE/MULTIPLE N/A 12/26/2015    Procedure: COLONOSCOPY, FLEXIBLE, PROXIMAL TO SPLENIC FLEXURE; WITH BIOPSY, SINGLE OR MULTIPLE;  Surgeon: Rozell Searing  Ahmed Mir, MD;  Location: PEDS PROCEDURE ROOM [facility];  Service: Gastroenterology    PR COLONOSCOPY W/BIOPSY SINGLE/MULTIPLE N/A 09/02/2017    Procedure: COLONOSCOPY, FLEXIBLE, PROXIMAL TO SPLENIC FLEXURE; WITH BIOPSY, SINGLE OR MULTIPLE;  Surgeon: Arnold Long Mir, MD;  Location: PEDS PROCEDURE ROOM [place];  Service: Gastroenterology    PR COLSC FLX W/REMOVAL LESION BY HOT BX FORCEPS N/A 08/27/2016    Procedure: COLONOSCOPY, FLEXIBLE, PROXIMAL TO SPLENIC FLEXURE; W/REMOVAL TUMOR/POLYP/OTHER LESION, HOT BX FORCEP/CAUTE;  Surgeon: Arnold Long Mir, MD;  Location: PEDS PROCEDURE ROOM [facility];  Service: Gastroenterology    PR COLSC FLX W/RMVL OF TUMOR POLYP LESION SNARE TQ N/A 02/25/2019    Procedure: COLONOSCOPY FLEX; W/REMOV TUMOR/LES BY SNARE;  Surgeon: Helyn Numbers, MD;  Location: [facility] PROCEDURES [facility] [facility];  Service: Gastroenterology    PR COLSC FLX W/RMVL OF TUMOR POLYP LESION SNARE TQ N/A 03/13/2020    Procedure: COLONOSCOPY FLEX; W/REMOV TUMOR/LES BY SNARE;  Surgeon: Helyn Numbers, MD;  Location: [facility] [facility];  Service: Gastroenterology    PR EXC SKIN BENIG 2.1-3 CM TRUNK,ARM,LEG Right 02/25/2017    Procedure: EXCISION, BENIGN LESION INCLUDE MARGINS, EXCEPT SKIN TAG, LEGS; EXCISED DIAMETER 2.1 TO 3.0 CM;  Surgeon: Clarene Duke, MD;  Location: CHILDRENS OR [facility];  Service: Plastics    PR EXC SKIN BENIG 3.1-4 CM TRUNK,ARM,LEG Right 02/25/2017    Procedure: EXCISION, BENIGN LESION INCLUDE MARGINS, EXCEPT SKIN TAG, ARMS; EXCISED DIAMETER 3.1 TO 4.0 CM;  Surgeon: Clarene Duke, MD;  Location: CHILDRENS OR [facility];  Service: Plastics    PR EXC SKIN BENIG >4 CM FACE,FACIAL Right 02/25/2017    Procedure: EXCISION, OTHER BENIGN LES INCLUD MARGIN, FACE/EARS/EYELIDS/NOSE/LIPS/MUCOUS MEMBRANE; EXCISED DIAM >4.0 CM;  Surgeon: Clarene Duke, MD;  Location: CHILDRENS OR [facility];  Service: Plastics    PR EXC TUMOR SOFT TISSUE LEG/ANKLE SUBQ 3+CM Right 08/05/2019    Procedure: EXCISION, TUMOR, SOFT TISSUE OF LEG OR ANKLE AREA, SUBCUTANEOUS; 3 CM OR GREATER;  Surgeon: Arsenio Katz, MD;  Location: MAIN OR [place];  Service: Plastics    PR EXC TUMOR SOFT TISSUE LEG/ANKLE SUBQ <3CM Right 08/05/2019    Procedure: EXCISION, TUMOR, SOFT TISSUE OF LEG OR ANKLE AREA, SUBCUTANEOUS; LESS THAN 3 CM;  Surgeon: Arsenio Katz, MD;  Location: MAIN OR [facility];  Service: Plastics    PR LAP, SURG PROCTECTOMY W J-POUCH N/A 08/10/2020    Procedure: ROBOTIC ASSISTED LAPAROSCOPIC PROCTOCOLECTOMY, ILEAL J POUCH, WITH OSTOMY;  Surgeon: Mickle Asper, MD;  Location: OR [place];  Service: General Surgery    PR NDSC EVAL INTSTINAL POUCH DX W/COLLJ SPEC SPX N/A 01/23/2021    Procedure: ENDO EVAL SM INTEST POUCH; DX;  Surgeon: Modena Nunnery, MD;  [facility]: [facility] [facility];  Service: Gastroenterology    PR NDSC EVAL INTSTINAL POUCH DX W/COLLJ SPEC SPX N/A 08/27/2021    Procedure: ENDO EVAL SM INTEST POUCH; DX;  Surgeon: Hunt Oris, MD;  [facility];  Service: Gastroenterology    PR NDSC EVAL INTSTINAL POUCH DX W/COLLJ SPEC SPX N/A 12/09/2021    Procedure: ENDO EVAL SM INTEST POUCH; DX;  Surgeon: Vidal Schwalbe, MD;  [facility];  Service: Gastroenterology    PR NDSC EVAL INTSTINAL POUCH DX W/COLLJ [facility] SPX Left 04/09/2022    Procedure: ENDO EVAL SM INTEST POUCH; DX;  Surgeon: Modena Nunnery, MD;  Location: [facility] [facility];  Service: Gastroenterology    PR NDSC EVAL INTSTINAL POUCH  DX W/COLLJ SPEC SPX N/A 08/05/2022    Procedure: ENDO EVAL SM INTEST POUCH; DX;  Surgeon: Modena Nunnery, MD;  [facility]: [facility]URES [facility];  Service: Gastroenterology    PR NDSC EVAL INTSTINAL POUCH W/BX SINGLE/MULTIPLE N/A 01/20/2022    Procedure: ENDOSCOPIC EVAL OF SMALL INTESTINAL POUCH; DIAGNOSTIC, No biopsies;  Surgeon: Andrey Farmer, MD;  [facility]: [facility]URES [facility];  Service: Gastroenterology    PR NDSC EVAL INTSTINAL POUCH W/BX SINGLE/MULTIPLE N/A 02/13/2022    Procedure: ENDOSCOPIC EVAL OF SMALL INTESTINAL POUCH; DIAGNOSTIC, WITH BIOPSY;  Surgeon: Bronson Curb, MD;  [facility]: [facility];  Service: Gastroenterology    PR UNLISTED PROCEDURE SMALL INTESTINE  01/23/2021    Procedure: UNLISTED PROCEDURE, SMALL INTESTINE;  Surgeon: Modena Nunnery, MD;  Location: [facility] [facility];  Service: Gastroenterology    PR UNLISTED PROCEDURE SMALL INTESTINE  02/13/2022    Procedure: UNLISTED PROCEDURE, SMALL INTESTINE;  Surgeon: Bronson Curb, MD;  Location: [facility]URES [facility]  Service: Gastroenterology    PR UPPER GI ENDOSCOPY,BIOPSY N/A 10/27/2012    Procedure: UGI ENDOSCOPY; WITH BIOPSY, SINGLE OR MULTIPLE;  Surgeon: Shirlyn Goltz Mir, MD;  Location: PEDS PROCEDURE ROOM [facility];  Service: Gastroenterology    PR UPPER GI ENDOSCOPY,BIOPSY N/A 09/14/2013    Procedure: UGI ENDOSCOPY; WITH BIOPSY, SINGLE OR MULTIPLE;  Surgeon: Shirlyn Goltz Mir, MD;  Location: PEDS PROCEDURE ROOM [facility];  Service: Gastroenterology    PR UPPER GI ENDOSCOPY,BIOPSY N/A 11/08/2014    Procedure: UGI ENDOSCOPY; WITH BIOPSY, SINGLE OR MULTIPLE;  Surgeon: Arnold Long Mir, MD;  Location: PEDS PROCEDURE ROOM [facility];  Service: Gastroenterology    PR UPPER GI ENDOSCOPY,BIOPSY N/A 12/26/2015    Procedure: UGI ENDOSCOPY; WITH BIOPSY, SINGLE OR MULTIPLE;  Surgeon: Arnold Long Mir, MD;  Location: PEDS PROCEDURE ROOM [facility];  Service: Gastroenterology    PR UPPER GI ENDOSCOPY,BIOPSY N/A 08/27/2016    Procedure: UGI ENDOSCOPY; WITH BIOPSY, SINGLE OR MULTIPLE;  Surgeon: Arnold Long Mir, MD;  Location: PEDS PROCEDURE ROOM [facility];  Service: Gastroenterology    PR UPPER GI ENDOSCOPY,BIOPSY N/A 09/02/2017    Procedure: UGI ENDOSCOPY; WITH BIOPSY, SINGLE OR MULTIPLE;  Surgeon: Arnold Long Mir, MD;  Location: PEDS PROCEDURE ROOM [facility];  Service: Gastroenterology    PR UPPER GI ENDOSCOPY,BIOPSY N/A 03/13/2020    Procedure: UGI ENDOSCOPY; WITH BIOPSY, SINGLE OR MULTIPLE;  Surgeon: Helyn Numbers, MD;  Location: [facility] [place] [facility];  Service: Gastroenterology    PR UPPER GI ENDOSCOPY,BIOPSY N/A 09/05/2021    Procedure: UGI ENDOSCOPY; WITH BIOPSY, SINGLE OR MULTIPLE;  Surgeon: Wendall Papa, MD;  [facility];  Service: Gastroenterology    PR UPPER GI ENDOSCOPY,DIAGNOSIS N/A 01/20/2022    Procedure: UGI ENDO, INCLUDE ESOPHAGUS, STOMACH, & DUODENUM &/OR JEJUNUM; DX W/WO COLLECTION SPECIMN, BY BRUSH OR WASH;  Surgeon: Andrey Farmer, MD;  [facility]: [facility]URES [facility];  Service: Gastroenterology    TUMOR REMOVAL      multiple-head, neck, back, hand, right flank, multiple             Family History   Problem Relation Age of Onset    No Known Problems Mother     No Known Problems Father     No Known Problems Sister     No Known Problems Brother     Stroke Maternal Grandmother     Other Maternal Grandmother         benign lesions of liver and pancreas, further details  unknown    Cancer Maternal Grandmother     Diabetes Maternal Grandmother     Hypertension Maternal Grandmother     Thyroid disease Maternal Grandmother     Arthritis Maternal Grandfather     Asthma Maternal Grandfather     COPD Paternal Grandmother         Deceased    Miscarriages / Stillbirths Paternal Grandmother     Alcohol abuse Paternal Grandfather         Deceased    No Known Problems Maternal Aunt     No Known Problems Maternal Uncle     No Known Problems Paternal Aunt     No Known Problems Paternal Uncle     Anesthesia problems Neg Hx     Broken bones Neg Hx     Cancer Neg Hx     Clotting disorder Neg Hx Collagen disease Neg Hx     Diabetes Neg Hx     Dislocations Neg Hx     Fibromyalgia Neg Hx     Gout Neg Hx     Hemophilia Neg Hx     Osteoporosis Neg Hx     Rheumatologic disease Neg Hx     Scoliosis Neg Hx     Severe sprains Neg Hx     Sickle cell anemia Neg Hx     Spinal Compression Fracture Neg Hx     Melanoma Neg Hx     Basal cell carcinoma Neg Hx     Squamous cell carcinoma Neg Hx         Allergies: Adhesive tape-silicones; Ferrlecit [sodium ferric gluconat-sucrose]; Levofloxacin; Methylnaltrexone; Neomycin; Papaya; Morphine; Zosyn [piperacillin-tazobactam]; Compazine [prochlorperazine]; Iron analogues; Iron dextran; and Latex, natural rubber                  Objective Findings  Precautions / Restrictions  Precautions: Falls precautions  Weight Bearing Status: Non-applicable  Required Braces or Orthoses: Non-applicable     Pain Comments: 9/10 mostly on abdomen with premedications  Medical Tests / Procedures: Vital signs, Labs, Imaging, Medical Review  Equipment / Environment: Vascular access (PIV, TLC, Port-a-cath, PICC)     Vitals/Orthostatics : BP supine 100/58 (71), HR 68, seated (> 5 mins) 112/75 (89), HR 68, stand (3-4 mins) 113/69 (84), HR 79. Seated after 250 ft ambulation 124/83 (96), HR 79, O2sat 98%RA. No dizziness or lightheadedness     Living Situation  Living Environment: House  Lives With: Mother, Father  Home Living: Multi-level home, Bed/bath upstairs, 1/2 bath on main level, Stairs to alternate level with rails, Stairs to enter without rails, Stairs to alternate level without rails, Handicapped height toilet, Tub/shower unit, Able to Live on main level with bedroom/bathroom  Number of Stairs to Enter (outside): 3  Rail placement (inside): Rail on right side  Number of Stairs to Alternate level (inside): 14  Caregiver Identified?: Yes  Caregiver Availability: 24 hours  Caregiver Ability: Limited lifting      Cognition: WFL  Orientation: Oriented x4  Visual/Perception: Within Functional Limits  Hearing: No deficit identified     Skin Inspection: Intact where visualized     Upper Extremities  UE ROM: Right WFL, Left WFL  UE Strength: Right WFL, Left WFL  UE comment: 4+/5 grossly    Lower Extremities  LE ROM: Right WFL, Left WFL  LE Strength: Right WFL, Left WFL  LE comment: 4/5 grossly    Face, Cervical and Trunk ROM  Face ROM: WFL  Cervical ROM: WFL  Trunk  ROM: WFL     Coordination: Not tested  Proprioception: Not tested  Sensation: Numbness  Posture: WFL  Motor/Sensory/Neuro Comments: C/o numbness B feel, LT intact bilaterally    Static Sitting-Level of Assistance: Independent  Dynamic Sitting-Level of Assistance: Archivist Standing-Level of Assistance: Stand by assistance  Dynamic Standing - Level of Assistance: Contact guard  Standing Balance comments: stand with RW      Bed Mobility: Supine to Sit  Supine to Sit assistance level: Independent     Transfers: Sit to Stand  Sit to Stand assistance level: Standby assist, set-up cues, supervision of patient - no hands on  Transfer comments: Sit <> stand with RW, CGA      Gait Level of Assistance: Contact guard assist, steadying assist  Gait Distance Ambulated (ft): 250 ft (steady gait but LE weakness noted)  Skilled Treatment Performed: ambulated 250 ft with RW, CGA     Stairs: NT      Wheelchair Mobility: N/a     Endurance: fair    Patient at end of session: All needs in reach, In bed, Friends/Family present, Lines intact, Nurse notified    Physical Therapy Session Duration  PT Individual [mins]: 21  PT Co-Treatment [mins]: 25 (with Adalberto Ill, OT)  Reason for Co-treatment: To safely progress mobility, Poor pain control          AM-PAC-6 click  Help currently need turning over In bed?: None - Modified Independent/Independent  Help currently needed sitting down/standing up from chair with arms? : A Little - Minimal/Contact Guard Assist/Supervision  Help currently needed moving from supine to sitting on edge of bed?: None - Modified Independent/Independent  Help currently needed moving to and from bed from wheelchair?: A Little - Minimal/Contact Guard Assist/Supervision  Help currently needed walking in a hospital room?: A Little - Minimal/Contact Guard Assist/Supervision  Help currently needed climbing 3-5 steps with railing?: A Little - Minimal/Contact Guard Assist/Supervision    Basic Mobility Score 6 click: 20    6 click Score (in points): % of Functional Impairment, Limitation, Restriction  6: 100% impaired, limited, restricted  7-8: At least 80%, but less than 100% impaired, limited restricted  9-13: At least 60%, but less than 80% impaired, limited restricted  14-19: At least 40%, but less than 60% impaired, limited restricted  20-22: At least 20%, but less than 40% impaired, limited restricted  23: At least 1%, but less than 20% impaired, limited restricted  24: 0% impaired, limited restricted        I attest that I have reviewed the above information.  Signed: Fredia Beets, PT  Filed 03/10/2023

## 2023-03-10 NOTE — Unmapped (Signed)
OCCUPATIONAL THERAPY  Evaluation (03/10/23 0851)    Patient Name:  [name] [name]       Medical Record Number: [id number]     Date of Birth: 2000/08/26  Sex: Female      Post-Discharge Occupational Therapy Recommendations: 3x weekly       Equipment Recommendation  OT DME Recommendations: Bedside commode, Tub Transfer Bench       OT Treatment Diagnosis: Generalized muscle weakness, Need for assistance with personal care, Limitation of activities due to disability         Assessment  Problem List: Decreased strength, Impaired ADLs, Decreased activity tolerance, Decreased mobility, Fall risk, Gait deviation, Impaired balance, Pain, Decreased endurance, Impaired sensation, Impaired tone    Personal Factors/Comorbidities (Occupational Profile and History Review): Expanded (Moderate)  Assessment of Occupational Performance : Balance, Endurance, Mobility, Sensation, Strength  Clinical Decision Making: Moderate Complexity    Assessment: Per chart [name] [name] is a 22 year old female with a complex medical history, including DVT (RUE), [name] syndrome (post-colectomy), desmoid tumors, anemia, and severe protein-calorie malnutrition. She is followed at [facility] for chronic abdominal and right shoulder pain, consistent with cancer-associated pain. 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 Kiyani's pain has worsened in recent months, likely due to intermittent intestinal obstructions related to her mesenteric desmoid tumors, which may cause intermittent bowel intussusception.    Pt presents to OT session agreeable to participate in self care tasks and functional t/fs to assess participation in ADLs and current level of function. At baseline pt reports she uses a RW or a w/c when she becomes fatigued and feels weak and at minimum has supervision assist during all ADLs. During today's session pt required CGA for functional t/fs and SBA for all ADL activities completed. Pt is limited by functional deficits noted above and would benefit from continued acute care OT to maintain CLOF and limit deconditioning during LOS. Pt would benefit from additional services at 3x weekly after discharge to ensure safety during self care tasks and optimize occupational independence. After reviewing pt's PMH, commorbidities, and occupational performance with consideration of clinical decision making required this evaluation is of moderate complexity.        Today's Interventions: OT eval; POC; Role of OT; functional t/fs; functional mobility; LB dressing; toileting; energy conservation strategies; pacing/prioritizing; use of DME     Activity Tolerance During Today's Session  Tolerated treatment well    Plan  Planned Frequency of Treatment: Plan of Care Initiated: 03/10/23  1-2x per day Weekly Frequency: 3-4 days per week  Planned Treatment Duration: 03/24/23    Planned Interventions:  Education (Patient/Family/Caregiver), Therapeutic Activity, Therapeutic Exercise, Self-Care/Home Training, Neuromuscular Re-education      GOALS:   Patient and Family Goals: Continue getting stronger    Short Term:   SHORT GOAL #1: Pt will complete LB dressing including item retrieval w/ SBA and LRAE.   Time Frame : 2 weeks  SHORT GOAL #2: Pt will complete toileting and pericare w/ modI and LRAE.   Time Frame : 2 weeks  SHORT GOAL #3: Pt will complete standing ADL task of at least 5 minutes w/ modI.   Time Frame : 2 weeks  Long Term Goal #1: Pt will score 24/24 on AMPAC.  Time Frame: 4 weeks    Prognosis:  Excellent  Positive Indicators:  pt's motivation; family support  Barriers to Discharge: Inaccessible home environment, Inability  to safely perform ADLS, Functional strength deficits, Endurance deficits, Pain    Subjective  Medical Updates Since Last Visit/Relevant PMH Affecting Clinical Decision Making:    Prior Functional Status Pt spent approximately 3 weeks at [facility] after most recent discharge from hospital. Initially pt was completing all ADLs and functional mobility w/ a RW or w/c and some assist. As pt progressed pt attempted to complete ADLs without assist from family and without her RW. Pt reports she began falling when she no longer used her DME or assist. Pt reports falling more than 2-3xs per week in the past month especially when getting up to go to the bathroom in the middle of the night. Pt reports she does not venture into the community yet d/t generalized weakness and deconditioning however has felt much stronger since discharge from [facility].    Medical Tests / Procedures: Reviewed in [facility]  Services patient receives prior to admission: PT (Pt going to OP PT)    Patient / Caregiver reports: Pt and RN agreeable to session. During session pt reported I have probably been falling more than 2-3xs per week.      Past Medical History:   Diagnosis Date    Abdominal pain     Acid reflux     occas    Anesthesia complication     itching, shaking, coldness; last few surgeries have gone much better    Cancer (CMS-HCC)     Cataract of right eye     COVID-19 virus infection 01/2019    Cyst of thyroid determined by ultrasound     monitoring    [name]     2 right forearm, 1 left thigh, 1 right scapula, 1 under left clavicle; multiple    Difficult intravenous access     [name] (familial adenomatous polyposis)     [name]     Gastric polyps     History of chemotherapy     last treatment approx 05/2019    History of colon polyps     History of COVID-19 01/2019    Iron deficiency anemia due to chronic blood loss     received iron infusion 11-2019    PONV (postoperative nausea and vomiting)     Rectal bleeding     Syncopal episodes     especially if becoming dehydrated    Social History     Tobacco Use    Smoking status: Never     Passive exposure: Past    Smokeless tobacco: Never   Substance Use Topics    Alcohol use: Never Past Surgical History:   Procedure Laterality Date    COLON SURGERY      cyroablation      cystis removal      desmoid removal      PR CLOSE ENTEROSTOMY,RESEC+ANAST N/A 10/09/2020    Procedure: ILEOSTOMY TAKEDOWN;  Surgeon: Mickle Asper, MD;  Location: OR [place];  Service: General Surgery    PR COLONOSCOPY W/BIOPSY SINGLE/MULTIPLE N/A 10/27/2012    Procedure: COLONOSCOPY, FLEXIBLE, PROXIMAL TO SPLENIC FLEXURE; WITH BIOPSY, SINGLE OR MULTIPLE;  Surgeon: Shirlyn Goltz Mir, MD;  Location: PEDS PROCEDURE ROOM [place];  Service: Gastroenterology    PR COLONOSCOPY W/BIOPSY SINGLE/MULTIPLE N/A 09/14/2013    Procedure: COLONOSCOPY, FLEXIBLE, PROXIMAL TO SPLENIC FLEXURE; WITH BIOPSY, SINGLE OR MULTIPLE;  Surgeon: Shirlyn Goltz Mir, MD;  Location: PEDS PROCEDURE ROOM [facility];  Service: Gastroenterology    PR COLONOSCOPY W/BIOPSY SINGLE/MULTIPLE N/A 11/08/2014    Procedure: COLONOSCOPY, FLEXIBLE, PROXIMAL TO SPLENIC  FLEXURE; WITH BIOPSY, SINGLE OR MULTIPLE;  Surgeon: Arnold Long Mir, MD;  Location: PEDS PROCEDURE ROOM [facility];  Service: Gastroenterology    PR COLONOSCOPY W/BIOPSY SINGLE/MULTIPLE N/A 12/26/2015    Procedure: COLONOSCOPY, FLEXIBLE, PROXIMAL TO SPLENIC FLEXURE; WITH BIOPSY, SINGLE OR MULTIPLE;  Surgeon: Arnold Long Mir, MD;  Location: PEDS PROCEDURE ROOM [facility];  Service: Gastroenterology    PR COLONOSCOPY W/BIOPSY SINGLE/MULTIPLE N/A 09/02/2017    Procedure: COLONOSCOPY, FLEXIBLE, PROXIMAL TO SPLENIC FLEXURE; WITH BIOPSY, SINGLE OR MULTIPLE;  Surgeon: Arnold Long Mir, MD;  Location: PEDS PROCEDURE ROOM [place];  Service: Gastroenterology    PR COLSC FLX W/REMOVAL LESION BY HOT BX FORCEPS N/A 08/27/2016    Procedure: COLONOSCOPY, FLEXIBLE, PROXIMAL TO SPLENIC FLEXURE; W/REMOVAL TUMOR/POLYP/OTHER LESION, HOT BX FORCEP/CAUTE;  Surgeon: Arnold Long Mir, MD;  Location: PEDS PROCEDURE ROOM [facility];  Service: Gastroenterology    PR COLSC FLX W/RMVL OF TUMOR POLYP LESION SNARE TQ N/A 02/25/2019    Procedure: COLONOSCOPY FLEX; W/REMOV TUMOR/LES BY SNARE;  Surgeon: Helyn Numbers, MD;  Location: GI PROCEDURES [place] [facility];  Service: Gastroenterology    PR COLSC FLX W/RMVL OF TUMOR POLYP LESION SNARE TQ N/A 03/13/2020    Procedure: COLONOSCOPY FLEX; W/REMOV TUMOR/LES BY SNARE;  Surgeon: Helyn Numbers, MD;  Location: [facility]ED[facility] [facility];  Service: Gastroenterology    PR EXC SKIN BENIG 2.1-3 CM TRUNK,ARM,LEG Right 02/25/2017    Procedure: EXCISION, BENIGN LESION INCLUDE MARGINS, EXCEPT SKIN TAG, LEGS; EXCISED DIAMETER 2.1 TO 3.0 CM;  Surgeon: Clarene Duke, MD;  Location: CHILDRENS OR [facility];  Service: Plastics    PR EXC SKIN BENIG 3.1-4 CM TRUNK,ARM,LEG Right 02/25/2017    Procedure: EXCISION, BENIGN LESION INCLUDE MARGINS, EXCEPT SKIN TAG, ARMS; EXCISED DIAMETER 3.1 TO 4.0 CM;  Surgeon: Clarene Duke, MD;  Location: CHILDRENS OR [facility]  Service: Plastics    PR EXC SKIN BENIG >4 CM FACE,FACIAL Right 02/25/2017    Procedure: EXCISION, OTHER BENIGN LES INCLUD MARGIN, FACE/EARS/EYELIDS/NOSE/LIPS/MUCOUS MEMBRANE; EXCISED DIAM >4.0 CM;  Surgeon: Clarene Duke, MD;  Location: CHILDRENS OR [facility];  Service: Plastics    PR EXC TUMOR SOFT TISSUE LEG/ANKLE SUBQ 3+CM Right 08/05/2019    Procedure: EXCISION, TUMOR, SOFT TISSUE OF LEG OR ANKLE AREA, SUBCUTANEOUS; 3 CM OR GREATER;  Surgeon: Arsenio Katz, MD;  Location: MAIN OR [place];  Service: Plastics    PR EXC TUMOR SOFT TISSUE LEG/ANKLE SUBQ <3CM Right 08/05/2019    Procedure: EXCISION, TUMOR, SOFT TISSUE OF LEG OR ANKLE AREA, SUBCUTANEOUS; LESS THAN 3 CM;  Surgeon: Arsenio Katz, MD;  Location: MAIN OR [facility];  Service: Plastics    PR LAP, SURG PROCTECTOMY W J-POUCH N/A 08/10/2020    Procedure: ROBOTIC ASSISTED LAPAROSCOPIC PROCTOCOLECTOMY, ILEAL J POUCH, WITH OSTOMY;  Surgeon: Mickle Asper, MD;  Location: OR [place];  Service: General Surgery    PR NDSC EVAL INTSTINAL POUCH DX W/COLLJ SPEC SPX N/A 01/23/2021    Procedure: ENDO EVAL SM INTEST POUCH; DX; Surgeon: Modena Nunnery, MD;  Location: [facility] [facility];  Service: Gastroenterology    PR NDSC EVAL INTSTINAL POUCH DX W/COLLJ SPEC SPX N/A 08/27/2021    Procedure: ENDO EVAL SM INTEST POUCH; DX;  Surgeon: Hunt Oris, MD;  Location: [facility] PROC[facility]URES [facility];  Service: Gastroenterology    PR NDSC EVAL INTSTINAL POUCH DX W/COLLJ SPEC SPX N/A 12/09/2021    Procedure: ENDO EVAL SM INTEST POUCH; DX;  Surgeon: Vidal Schwalbe, MD;  Location: GI PROCEDURES [facility];  Service: Gastroenterology  PR NDSC EVAL INTSTINAL POUCH DX W/COLLJ SPEC SPX Left 04/09/2022    Procedure: ENDO EVAL SM INTEST POUCH; DX;  Surgeon: Modena Nunnery, MD;  [facility]: [facility] [facility];  Service: Gastroenterology    PR NDSC EVAL INTSTINAL POUCH DX W/COLLJ SPEC SPX N/A 08/05/2022    Procedure: ENDO EVAL SM INTEST POUCH; DX;  Surgeon: Modena Nunnery, MD;  Location: GI PROCEDURES [facility];  Service: Gastroenterology    PR NDSC EVAL INTSTINAL POUCH W/BX SINGLE/MULTIPLE N/A 01/20/2022    Procedure: ENDOSCOPIC EVAL OF SMALL INTESTINAL POUCH; DIAGNOSTIC, No biopsies;  Surgeon: Andrey Farmer, MD;  Location: GI PROCEDURES [facility];  Service: Gastroenterology    PR NDSC EVAL INTSTINAL POUCH W/BX SINGLE/MULTIPLE N/A 02/13/2022    Procedure: ENDOSCOPIC EVAL OF SMALL INTESTINAL POUCH; DIAGNOSTIC, WITH BIOPSY;  Surgeon: Bronson Curb, MD;  Location: GI PROCEDURES [facility];  Service: Gastroenterology    PR UNLISTED PROCEDURE SMALL INTESTINE  01/23/2021    Procedure: UNLISTED PROCEDURE, SMALL INTESTINE;  Surgeon: Modena Nunnery, MD;  Location: [facility] [facility];  Service: Gastroenterology    PR UNLISTED PROCEDURE SMALL INTESTINE  02/13/2022    Procedure: UNLISTED PROCEDURE, SMALL INTESTINE;  Surgeon: Bronson Curb, MD;  Location: GI PROCEDURES [facility];  Service: Gastroenterology    PR UPPER GI ENDOSCOPY,BIOPSY N/A 10/27/2012 Procedure: UGI ENDOSCOPY; WITH BIOPSY, SINGLE OR MULTIPLE;  Surgeon: Shirlyn Goltz Mir, MD;  Location: PEDS PROCEDURE ROOM [facility];  Service: Gastroenterology    PR UPPER GI ENDOSCOPY,BIOPSY N/A 09/14/2013    Procedure: UGI ENDOSCOPY; WITH BIOPSY, SINGLE OR MULTIPLE;  Surgeon: Shirlyn Goltz Mir, MD;  Location: PEDS PROCEDURE ROOM [facility];  Service: Gastroenterology    PR UPPER GI ENDOSCOPY,BIOPSY N/A 11/08/2014    Procedure: UGI ENDOSCOPY; WITH BIOPSY, SINGLE OR MULTIPLE;  Surgeon: Arnold Long Mir, MD;  Location: PEDS PROCEDURE ROOM [facility];  Service: Gastroenterology    PR UPPER GI ENDOSCOPY,BIOPSY N/A 12/26/2015    Procedure: UGI ENDOSCOPY; WITH BIOPSY, SINGLE OR MULTIPLE;  Surgeon: Arnold Long Mir, MD;  Location: PEDS PROCEDURE ROOM [facility];  Service: Gastroenterology    PR UPPER GI ENDOSCOPY,BIOPSY N/A 08/27/2016    Procedure: UGI ENDOSCOPY; WITH BIOPSY, SINGLE OR MULTIPLE;  Surgeon: Arnold Long Mir, MD;  Location: PEDS PROCEDURE ROOM [facility];  Service: Gastroenterology    PR UPPER GI ENDOSCOPY,BIOPSY N/A 09/02/2017    Procedure: UGI ENDOSCOPY; WITH BIOPSY, SINGLE OR MULTIPLE;  Surgeon: Arnold Long Mir, MD;  Location: PEDS PROCEDURE ROOM [facility];  Service: Gastroenterology    PR UPPER GI ENDOSCOPY,BIOPSY N/A 03/13/2020    Procedure: UGI ENDOSCOPY; WITH BIOPSY, SINGLE OR MULTIPLE;  Surgeon: Helyn Numbers, MD;  Location: [facility]EDURES [facility] [facility];  Service: Gastroenterology    PR UPPER GI ENDOSCOPY,BIOPSY N/A 09/05/2021    Procedure: UGI ENDOSCOPY; WITH BIOPSY, SINGLE OR MULTIPLE;  Surgeon: Wendall Papa, MD;  Location: GI PROCEDURES [facility];  Service: Gastroenterology    PR UPPER GI ENDOSCOPY,DIAGNOSIS N/A 01/20/2022    Procedure: UGI ENDO, INCLUDE ESOPHAGUS, STOMACH, & DUODENUM &/OR JEJUNUM; DX W/WO COLLECTION SPECIMN, BY BRUSH OR WASH;  Surgeon: Andrey Farmer, MD;  Location: GI PROCEDURES [facility];  Service: Gastroenterology    TUMOR REMOVAL      multiple-head, neck, back, hand, right flank, multiple Family History   Problem Relation Age of Onset    No Known Problems Mother     No Known Problems Father     No Known Problems Sister     No Known Problems Brother  Stroke Maternal Grandmother     Other Maternal Grandmother         benign lesions of liver and pancreas, further details unknown    Cancer Maternal Grandmother     Diabetes Maternal Grandmother     Hypertension Maternal Grandmother     Thyroid disease Maternal Grandmother     Arthritis Maternal Grandfather     Asthma Maternal Grandfather     COPD Paternal Grandmother         Deceased    Miscarriages / Stillbirths Paternal Grandmother     Alcohol abuse Paternal Grandfather         Deceased    No Known Problems Maternal Aunt     No Known Problems Maternal Uncle     No Known Problems Paternal Aunt     No Known Problems Paternal Uncle     Anesthesia problems Neg Hx     Broken bones Neg Hx     Cancer Neg Hx     Clotting disorder Neg Hx     Collagen disease Neg Hx     Diabetes Neg Hx     Dislocations Neg Hx     Fibromyalgia Neg Hx     Gout Neg Hx     Hemophilia Neg Hx     Osteoporosis Neg Hx     Rheumatologic disease Neg Hx     Scoliosis Neg Hx     Severe sprains Neg Hx     Sickle cell anemia Neg Hx     Spinal Compression Fracture Neg Hx     Melanoma Neg Hx     Basal cell carcinoma Neg Hx     Squamous cell carcinoma Neg Hx         Adhesive tape-silicones; Ferrlecit [sodium ferric gluconat-sucrose]; Levofloxacin; Methylnaltrexone; Neomycin; Papaya; Morphine; Zosyn [piperacillin-tazobactam]; Compazine [prochlorperazine]; Iron analogues; Iron dextran; and Latex, natural rubber     Objective Findings  Precautions / Restrictions  Falls precautions       Weight Bearing  Non-applicable    Required Braces or Orthoses  Non-applicable    Communication Preference  Verbal       Pain  Pt endorses 7/10 pain. RN provided pain medication prior to start of session and session adjusted to pt's tolerance.    Equipment / Environment  Vascular access (PIV, TLC, Port-a-cath, PICC)    Living Situation  Living Environment: House  Lives With: Mother, Father  Home Living: Multi-level home, Bed/bath upstairs, 1/2 bath on main level, Stairs to alternate level with rails, Stairs to enter without rails, Stairs to alternate level without rails, Handicapped height toilet, Tub/shower unit, Able to Live on main level with bedroom/bathroom  Number of Stairs to Enter (outside): 3  Rail placement (inside): Rail on right side  Number of Stairs to Alternate level (inside): 14  Caregiver Identified?: Yes  Caregiver Availability: 24 hours  Caregiver Ability: Limited lifting  Equipment available at home: Rollator, Rolling walker, Wheelchair-manual     Cognition   Orientation Level:  Oriented x 4   Arousal/Alertness:  Appropriate responses to stimuli   Attention Span:  Appears intact   Memory:  Appears intact   Following Commands:  Follows all commands and directions without difficulty   Safety Judgment:  Good awareness of safety precautions   Awareness of Errors and Problem Solving:  Patient self-corrected errors   Comments:      Vision / Hearing   Vision: No acute deficits identified     Hearing: No deficit identified  Hand Function:  Right Hand Function: Right hand grip strength, ROM and coordination WNL  Left Hand Function: Left hand grip strength, ROM and coordination WNL    Skin Inspection:  Skin Inspection: Intact where visualized    Face/Cervical ROM:  Face ROM: WFL  Cervical ROM: WFL    ROM / Strength:  UE ROM/Strength: Left WFL, Right WFL  LE ROM/Strength: Left Impaired/Limited, Right Impaired/Limited  RLE Impairment: Reduced strength  LLE Impairment: Reduced strength  LE ROM/ Strength Comment: Pt grossly 3+/5 however with decreased strength during MMT of BLE knee flexion/marching    Coordination:  Coordination: WFL    Sensation:  RUE Sensation: RUE intact  LUE Sensation: LUE intact  RLE Sensation: RLE impaired  RLE Sensation Impairment: Numbness, Tingling  LLE Sensation: LLE impaired  LLE Sensation Impairment: Numbness, Tingling  Sensory/ Proprioception/ Stereognosis comments: Pt reports consistent neuropathy in B feet - light touch in tact when tested    Balance:  Static Sitting-Level of Assistance: Independent  Dynamic Sitting-Level of Assistance: Archivist Standing-Level of Assistance: Contact guard  Dynamic Standing - Level of Assistance: Contact guard  Standing Balance comments: w/ RW    Functional Mobility  Transfers: Standby assist (STS w/ RW)  Bed Mobility - Needs Assistance: Modified Independent (HOB elevated)  Ambulation: Pt ambulated longer household distance w/ RW and CGA. No overt LOBs noted and VSS.    ADLs  ADLs: Needs assistance with ADLs  ADLs - Needs Assistance: Feeding, Grooming, Bathing, Toileting, UB dressing, LB dressing  Feeding - Needs Assistance: Set Up Assist  Grooming - Needs Assistance: Supervision, Performed standing  Bathing - Needs Assistance: Contact Guard assist (anticipated)  Toileting - Needs Assistance: Supervision, Performed standing  UB Dressing - Needs Assistance: Set Up Assist  LB Dressing - Needs Assistance: Contact Guard assist, Performed seated  IADLs: Not tested    Vitals / Orthostatics  Vitals/Orthostatics: VSS: MAP remained 85-90s throughout session    Patient at end of session: All needs in reach, In bed, Friends/Family present, Staff present     Occupational Therapy Session Duration  OT Individual [mins]: 10  OT Co-Treatment [mins]: 25 (Janbooranapinij, [place] PT)  Reason for Co-treatment: Poor pain control       AM-PAC-Daily Activity  Lower Body Dressing assistance needs: A Little - Minimal/Contact Guard Assist/Supervision  Bathing assistance needs: A Little - Minimal/Contact Guard Assist/Supervision  Toileting assistance needs: A Little - Minimal/Contact Guard Assist/Supervision  Upper Body Dressing assistance needs: A Little - Minimal/Contact Guard Assist/Supervision  Personal Grooming assistance needs: None - Modified Independent/Independent  Eating Meals assistance needs: A Little - Minimal/Contact Guard Assist/Supervision    Daily Activity Score: 19    Score (in points): % of Functional Impairment, Limitation, Restriction  6: 100% impaired, limited, restricted  7-8: At least 80%, but less than 100% impaired, limited restricted  9-13: At least 60%, but less than 80% impaired, limited restricted  14-19: At least 40%, but less than 60% impaired, limited restricted  20-22: At least 20%, but less than 40% impaired, limited restricted  23: At least 1%, but less than 20% impaired, limited restricted  24: 0% impaired, limited restricted      I attest that I have reviewed the above information.  Signed: Adalberto Ill, OT  Filed 03/10/2023

## 2023-03-10 NOTE — Unmapped (Signed)
=  Department of Anesthesiology  Pain Medicine Division    Chronic Pain inpatient Consult follow up Note      Requesting Attending Physician:  Hewitt Blade, MD  Service Requesting Consult:  Oncology/Hematology (MDE)    Assessment/Recommendations:  [name] [name] is a 22 year old female with a complex medical history, including DVT (RUE), [name] syndrome (post-colectomy), desmoid tumors, anemia, and severe protein-calorie malnutrition. She is followed at [facility] for chronic abdominal and right shoulder pain, consistent with cancer-associated pain. 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 Her pain is further complicated by narcotic bowel syndrome, visceral hyperalgesia, and a positive feedback loop where increased opioid use exacerbates the pain. [name] [name]???s most recent hospitalization was complicated by ileus, requiring NG tube, a prolonged inpatient rehab stay.     Recently, [name] [name] had been seen by [name] in December 2024, who decided to switch her from Suboxone to [facility] 20 mcg/h patch and continue oxycodone 10 mg 3 times a day with flexibility to increase to 5 times a day for acute pain flares. However, over the past couple of weeks, she has experienced worsening abdominal and low back pain, along with an increase in stool frequency (up to 15 times a day with watery stools for 4 weeks) and some hematochezia.    On 03/08/23, she presented for an outpatient MRI but experienced a flare-up of her chronic pain after completing one of the three planned studies. She was admitted to the [facility] emergency department for intractable acute on chronic pain.  Upon evaluation, [name] [name] reported uncontrolled pain, rating her pain as 10/10. She noted that oral oxycodone 10 mg was ineffective, and the night team had not administered IV hydromorphone, which has helped her in the past. She felt she had fallen behind on pain control, leading to worsening symptoms today.    In previous hospitalizations, [name] [name] had received ketamine infusions (5/24, 6/6, 8/1) and a lidocaine infusion (09/28/2022). However, there was inconsistency in reporting the effectiveness of these treatments, making it difficult to assess their true benefit.     I contacted [name] [name]'s primary pain provider, [name], who recommended the following treatment plan:  Continue Butrans patch at 20 mcg/h.  Increase oxycodone liquid to 15 mg every 6 hours, or up to every 4 hours as needed for pain.  Gradually wean off IV hydromorphone.  Avoid ketamine infusion at this time.  This plan was discussed with [name] [name], who agreed to the recommendations. The primary admitting team was present during the discussion.    We have also advised [name] [name] to discuss with [name] to create an ED/Admission care plan to streamline her care, upon her presentation to the hospital in the future. To which [name] [name] and her mother agreeable to.     Interval: NAEO. Mother states that patient looks visibly better. Infact, she had participated with PT to walk around in the hallway today. Pt thinks she slept more than 3 hours last night, but what kept her up mostly was trips to the bathroom due to water diarrhea (almost once every hour). She and her mother doesn't believe it's c.diff related and had declined to send stool sample for testing.     Her pain wise patient feels things have improved, aalthough she notes there are still  times that the pain gets up to 10/10. She think oral oxy 15 liquid is working well. We discussed about switching from [facility] to PRN for safety reason and she is agreeable to that. She was given iv HM 0.5 q4 hrs last night (as on the floor nurses could not administered 0.5 mg q2 hrs that was ordered for her) . She states that was helping but not at goal. She would like to keep HM 0.5-1 mg q4hs PRN. Which is a reasonable trial.     Since she has significant watery diarrhea, we discussed about holding her movantik right now and monitor the progress and may potential introduce home regimen when her stool frequency is at baseline.     Recommendations:  -The chronic pain service is a consult service and does not place orders, just makes recommendations (except ketamine and lidocaine infusions)  -Please evaluate all patients on opioids for appropriateness of prescribing narcan at discharge.  The chronic pain service can assist with this.  Nasal narcan covered by most insurance.  -Recommendations given apply to the current hospitalization and do not reflect long term recommendations.    Recommended to   -Continue Butrans patch 20 mcg/h q. Weekly  -Continue oxycodone liquid 15 mg every 4 hours as needed   -change IV hydromorphone to 0.5-1 mg every 4 hours as needed (with max 1 mg in 4 hrs interval). Wean as tolerated by reducing frequency or dose at a time per patient preference.   -Continue Tylenol 1 g 3 times daily  -Hold movantik (due to diarrhea)    We will continue to follow.    Medications trialed during this admission (or pertinent in past): Hydromorphone IV, oxycodone oral, Butrans patch    Please make sure that patient has prescription of naloxone at discharge.  Nasal narcan for most insured (Nasal narcan 4mg /actuation, prescribe 1 kit, instructions at [email or web address]).  For uninsured, chronic pain can work to assist in finding an option.  OTC nasal narcan now available at most pharmacies for around $45.    History of Present Illness:    Reason for Consult: Acute on chronic abdominal pain    Patient describes the pain in her abdomen sharp stabbing and often cramping diffusely lower right side more pronounced also has chronic pain in her lower buttock area and some pain in her hip area.    Analgesia Evaluation:  Pain at minimum: 5/10  Pain at maximum: 10/10    Prior to admission, the patient was on home opiate medications.  Shehas been followed by a pain clinic.  The [place] CSRS was reviewed.    Allergies    Allergies   Allergen Reactions    Adhesive Tape-Silicones Hives and Rash     Paper tape and Tegederm ok. Biopatch causes blistering but has tolerated CHG Tegaderm    Ferrlecit [Sodium Ferric Gluconat-Sucrose] Swelling and Rash    Levofloxacin Swelling and Rash     Swelling in mouth, rash,     Methylnaltrexone      Per Patient: I lost bowel control, severe abdominal cramping, and elevated BP    Neomycin Swelling     Rxn after ear drops; ear swelling    Papaya Hives    Morphine Nausea And Vomiting    Zosyn [Piperacillin-Tazobactam] Itching and Rash     Red and itchy    Compazine [Prochlorperazine] Other (See Comments)     Extreme agitation    Iron Analogues     Iron Dextran Itching  Received iron dextran 06/08/22 over 12 hours, had itching and redness/flushing during the infusion and for a couple days after. Required IV benadryl w/flares between doses and ultimately treated w/IV methylpred for 2 days.     Latex, Natural Rubber Rash     Inpatient Medications  Current Facility-Administered Medications   Medication Dose Route Frequency Provider Last Rate Last Admin    acetaminophen (TYLENOL) tablet 1,000 mg  1,000 mg Oral Q8H SCH Gogerly-Moragoda, Ellene Route, MD   1,000 mg at 03/09/23 1738    buprenorphine 20 mcg/hour transdermal patch 1 patch  1 patch Transdermal Q7 Days Emogene Morgan, MD   1 patch at 03/08/23 2340    emollient combination no.92 (LUBRIDERM) lotion   Topical Q1H PRN Emogene Morgan, MD        escitalopram oxalate (LEXAPRO) tablet 5 mg  5 mg Oral Daily Melvia Heaps D, MD   5 mg at 03/10/23 7253    famotidine (PEPCID) tablet 20 mg  20 mg Oral Nightly Melvia Heaps D, MD   20 mg at 03/09/23 2017    fluticasone propionate (FLONASE) 50 mcg/actuation nasal spray 2 spray  2 spray Topical Q7 Days Emogene Morgan, MD   2 spray at 03/09/23 0249    hydrocortisone 2.5 % cream   Topical BID PRN Feagins, Vale Haven, MD        HYDROmorphone (PF) (DILAUDID) injection Syrg 0.5 mg  0.5 mg Intravenous Q4H PRN Gogerly-Moragoda, Ellene Route, MD        lidocaine (ASPERCREME) 4 % 1 patch  1 patch Transdermal Daily Gogerly-Moragoda, Ellene Route, MD        LORazepam (ATIVAN) tablet 1 mg  1 mg Oral TID PRN Feagins, Vale Haven, MD        mirtazapine (REMERON) tablet 7.5 mg  7.5 mg Oral Nightly Gogerly-Moragoda, Ellene Route, MD        [Provider Hold] naloxegol (MOVANTIK) tablet 12.5 mg  12.5 mg Oral Daily Melvia Heaps D, MD   12.5 mg at 03/10/23 0836    naloxone [facility]) injection 0.4 mg  0.4 mg Intravenous Once PRN Garnet Sierras, MD        ondansetron (ZOFRAN) injection 8 mg  8 mg Intravenous Q6H PRN Garnet Sierras, MD   8 mg at 03/10/23 6644    oxyCODONE (ROXICODONE) 5 mg/5 mL solution 15 mg  15 mg Oral Q4H PRN Gogerly-Moragoda, Ellene Route, MD        promethazine (PHENERGAN) 12.5 mg in sodium chloride (NS) 0.9 % 50 mL IVPB  12.5 mg Intravenous Q12H PRN Garnet Sierras, MD   Stopped at 03/10/23 0310    scopolamine (TRANSDERM-SCOP) 1 mg over 3 days topical patch 1 mg  1 patch Topical Q72H Feagins, Dorathy Daft D, MD   1 mg at 03/08/23 2338    sodium chloride (NS) 0.9 % flush 10 mL  10 mL Intravenous BID Melvia Heaps D, MD   10 mL at 03/10/23 0837    sodium chloride (NS) 0.9 % flush 10 mL  10 mL Intravenous BID Melvia Heaps D, MD   10 mL at 03/09/23 2018    sodium chloride (NS) 0.9 % flush 10 mL  10 mL Intravenous BID Melvia Heaps D, MD   10 mL at 03/09/23 2018    sodium chloride (NS) 0.9 % infusion  20 mL/hr Intravenous Continuous Emogene Morgan, MD   Held at 03/09/23 0250    sucralfate (CARAFATE) oral suspension  1 g Oral QID  Emogene Morgan, MD   1 g at 03/09/23 2129    triamcinolone (KENALOG) 0.1 % ointment   Topical BID PRN Emogene Morgan, MD               Lab Results   Component Value Date    CREATININE 0.46 (L) 03/09/2023       Lab Results   Component Value Date    ALKPHOS 95 03/09/2023    BILITOT 0.4 03/09/2023    BILIDIR 0.10 10/29/2022    PROT 7.4 03/09/2023    ALBUMIN 3.9 03/09/2023    ALT 15 03/09/2023    AST 22 03/09/2023       Encounter Date: 03/08/23   ECG 12 Lead   Result Value    EKG Systolic BP     EKG Diastolic BP     EKG Ventricular Rate 82    EKG Atrial Rate 82    EKG P-R Interval 142    EKG QRS Duration 88    EKG Q-T Interval 382    EKG QTC Calculation 446    EKG Calculated P Axis 53    EKG Calculated R Axis 39    EKG Calculated T Axis 20    QTC Fredericia 424    Narrative    NORMAL SINUS RHYTHM  NONSPECIFIC T WAVE ABNORMALITY  ABNORMAL ECG  WHEN COMPARED WITH ECG OF 02-Nov-2022 08:33,  NONSPECIFIC T WAVE ABNORMALITY NOW EVIDENT IN ANTERIOR LEADS  Confirmed by Pollyann Kennedy ([id number]) on 03/09/2023 7:31:09 PM       No results found for requested labs within last 30 days.       Objective:     Vital Signs  Temp:  [36.5 ??C (97.7 ??F)-37 ??C (98.6 ??F)] 36.5 ??C (97.7 ??F)  Heart Rate:  [59-78] 64  Resp:  [16-18] 16  BP: (103-128)/(64-79) 103/64  MAP (mmHg):  [76-92] 76  SpO2:  [92 %-100 %] 100 %  BMI (Calculated):  [19.14] 19.14    Physical Exam    GENERAL:  Well developed, well-nourished female and is in no apparent distress.   HEAD/NECK:    Reveals normocephalic/atraumatic.   CARDIOVASCULAR:   Regular rate  LUNGS:   Normal work of breathing, no supplemental 02  EXTREMITIES:  Warm, no clubbing, cyanosis, or edema was noted.  NEUROLOGIC:    The patient was alert and oriented times four with normal language, attention, cognition and memory. Cranial nerve exam was grossly normal.  MUSCULOSKELETAL:    Moving all 4 extremities.    SKIN:  No obvious rashes lesions or erythema  PSY:  Appropriate affect and mood.      Problem List    Principal Problem:    Chronic pain due to neoplasm  Active Problems:    [name]    Generalized anxiety disorder with panic attacks    GI bleeding    MDD (major depressive disorder)    Ileus (CMS-HCC)    Dizziness    Fatigue    Hypercalcemia       Medical Decision Making    Problems Addressed:  1 or more chronic illnesses with severe exacerbation, progression, or side effects of treatment (high) pain still up to 10/10    Amount/Complexity:  Reviewed external records:  oncology 03/10/23, GI consult 03/10/23  Reviewed results:  Labs 03/10/23 Cr (0.5) and LFTs (OK)  Ordered tests: None  Assessment requiring independent historian: None  Discussion of management/test results with medical professional: Primary team  Independent interpretation of test:  None    Risk  High: Drug therapy requiring intensive monitoring for toxicity, Decision regarding hospitalization, Elective Major Surgery w/ risk factors, Emergency Major Surgery, Decision to DNR or de-escalate care because of poor prognosis, parental controlled substances- IV opioids      I was immediately available during all portions of the patient's visit and discussed the assessment and plan with the fellow.  I reviewed the chart and the fellow's note and agree with the findings and plan. Criss Rosales, MD

## 2023-03-10 NOTE — Unmapped (Signed)
Oncology (MEDO) Progress Note    Assessment & Plan:   [name] [name] is a 22 y.o. female whose presentation is complicated by [name] c/b desmoid fibromatomsis, recurrent ileus, GI bleeding, complex pain, PTSD secondary to chronic medical condition and multiple healthcare interactions who presented to [facility] with refractory pain in setting of large tumor burden.     Principal Problem:    Chronic pain due to neoplasm  Active Problems:    [name]    Generalized anxiety disorder with panic attacks    GI bleeding    MDD (major depressive disorder)    Dizziness    Fatigue      Active Problems    Acute on Chronic Generalized & Abdominal Pain - [name] Burden  The patient follows with [facility], most recently was seen in clinic in December 2024. It has been documented that IV opioids likely precipitate narcotic bowel syndrome and generates a positive feedback loop. Patient with multiple instances of ileus while hospitalized due to high requirement of opioids. She has been placed on an IV lidocaine drip and IV ketamine in the past for uncontrolled pain, and has been noted by her outpatient pain providers that this would be recommended for future inpatient treatments (see pain management note 02/18/23). She was evaluated by chronic pain and decided to avoid ketamine at this time as she only has one more session available before May 2025. Holding off on lidocaine infusion to see if increased oral opioids dose would be effective as is patient's preference.   - Evaluated by chronic pain team, appreciate recs   - Tylenol 1000 mg 3 times daily scheduled   - Oxycodone 15 mg oral solution q4h PRN, plan to space out as able   - Dilaudid 1 mg q4h PRN, set expectation to rapidly de-escalate    - Discussed with patient plan to eventually wean IV opioids as we increase oral meds   - Butrans patch   - Lidocaine patch     Dizziness - Fatigue - Malnutrition - Diarrhea   Hematochezia - Anastomotic ulcers   Patient reports recent dizziness upon standing and generalized feeling of malaise. Notes 15-20 stools per day this week, increase from baseline of 10 stools per day. She reports she has lost 5lbs over the past week or so. Of note, she did have her corpak removed early December due to blockage. Was on TF regimen Osmolite 1.5 as tolerated, 50-75 mL/h. No objective evidence by imaging or endoscopic findings to suggest etiology of inability to tolerate PO intake. Follows with nutrition outpatient. Suspect patient is orthostatic 2/2 poor PO intake and increased stools which are bloody in nature. She has a known history of chronic GI bleeding from anastomotic ulcer. Not anemic however see below for potential iron deficiency.   - GI consulted, appreciate recs   - Cefdinir 300 mg BID    - Plan for potential pouchoscopy on 1/2 vs 1/3, patient can drive bowel prep (miralax vs enema)   - IV iron if needed, studies pending    - Hold naloxegol   - encourage PO  - nutrition consult   - Pantoprazole 40 mg nightly (noon)  - Famotine 20 mg nightly (scheduled for 10pm)      Iron deficiency anemia - History of DVT   Follows with [facility]. History of anaphylaxis to IV iron and will need pre and post medication during any treatments.   - monitor Hgb daily   - SCDs in place   -  Follow up iron panel. 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 [name].  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  Was most recently seen on 12/17 at which time nirogacestat was continued.  Of note, outpatient MRI was scheduled for restaging of desmoid tumor which was partially completed today. Patient reports she was only able to tolerate the abdomen and pelvis. Her pain was too great to continue.  - Notified Dr.Stein of admission  - Nirogacestat while in hospital, patient supplied and verified by pharmacy (cannot be within 2 hours of pantoprazole or famotidine)   - Outpatient MRI scans rescheduled for Thursday. Re-ordered as inpatient     Hypercalcemia (resolved)   Total calcium of 10.8. Asymptomatic. iCal normal at 4.66.    PTSD - Generalized Anxiety Disorder - Major Depressive Disorder  PTSD secondary to complex medical condition and interactions with medical system. Follows with [facility].   - continue nightly Mirtazapine 7.5 mg   - Escitalopram 5 mg daily     Issues Impacting Complexity of Management:  -Parenteral controlled medications: IV Dilaudid        Daily Checklist:  Diet: Regular Diet  DVT PPx: SCDs  Electrolytes: Replete Potassium to >/= 3.6 and Magnesium to >/= 1.8  Code Status: Full Code  Dispo: Home pending improvement of pain     Team Contact Information:   Primary Team: Oncology (MEDO)  Primary Resident: Garnet Sierras, MD  Resident's Pager: [phone] (Oncology Intern - Alvester Morin)    Interval History:   She reports her pain was better controlled on current regimen overnight, however there were times when she considered asking for additional 1x doses of PRNs. She required 0.5 mg dilaudid IV 4x overnight.     Re Nirogacestat- she takes it 12 hours apart at home. Cannot be taken within 2 hours of pantoprazole or famotidine.     Objective:   Temp:  [36.4 ??C (97.5 ??F)-37 ??C (98.6 ??F)] 36.7 ??C (98 ??F)  Heart Rate:  [62-83] 62  SpO2 Pulse:  [82-96] 96  Resp:  [15-18] 18  BP: (113-128)/(64-79) 113/64  SpO2:  [92 %-100 %] 97 %    Gen: NAD  HENT: atraumatic, normocephalic  Heart: regular rate on tele   Lungs: normal work of breathing on room air   Abdomen: soft, tender lower abdomen with masses   Extremities: No edema

## 2023-03-11 LAB — COMPREHENSIVE METABOLIC PANEL
ALBUMIN: 3.2 g/dL — ABNORMAL LOW (ref 3.4–5.0)
ALKALINE PHOSPHATASE: 80 U/L (ref 46–116)
ALT (SGPT): 11 U/L (ref 10–49)
ANION GAP: 8 mmol/L (ref 5–14)
AST (SGOT): 12 U/L (ref <=34)
BILIRUBIN TOTAL: 0.3 mg/dL (ref 0.3–1.2)
BLOOD UREA NITROGEN: 5 mg/dL — ABNORMAL LOW (ref 9–23)
BUN / CREAT RATIO: 8
CALCIUM: 9.6 mg/dL (ref 8.7–10.4)
CHLORIDE: 106 mmol/L (ref 98–107)
CO2: 28 mmol/L (ref 20.0–31.0)
CREATININE: 0.64 mg/dL (ref 0.55–1.02)
EGFR CKD-EPI (2021) FEMALE: >90 mL/min/{1.73_m2} (ref >=60)
GLUCOSE RANDOM: 91 mg/dL (ref 70–179)
POTASSIUM: 4.3 mmol/L (ref 3.5–5.1)
PROTEIN TOTAL: 6.3 g/dL (ref 5.7–8.2)
SODIUM: 142 mmol/L (ref 135–145)

## 2023-03-11 LAB — CBC W/ AUTO DIFF
BASOPHILS ABSOLUTE COUNT: 0 10*9/L (ref 0.0–0.1)
BASOPHILS RELATIVE PERCENT: 0.6 %
EOSINOPHILS ABSOLUTE COUNT: 0.4 10*9/L (ref 0.0–0.5)
EOSINOPHILS RELATIVE PERCENT: 5.8 %
HEMATOCRIT: 37.2 % (ref 34.0–44.0)
HEMOGLOBIN: 12.3 g/dL (ref 11.3–14.9)
LYMPHOCYTES ABSOLUTE COUNT: 2.3 10*9/L (ref 1.1–3.6)
LYMPHOCYTES RELATIVE PERCENT: 33.2 %
MEAN CORPUSCULAR HEMOGLOBIN CONC: 33.1 g/dL (ref 32.0–36.0)
MEAN CORPUSCULAR HEMOGLOBIN: 26.2 pg (ref 25.9–32.4)
MEAN CORPUSCULAR VOLUME: 79.3 fL (ref 77.6–95.7)
MEAN PLATELET VOLUME: 10.4 fL (ref 6.8–10.7)
MONOCYTES ABSOLUTE COUNT: 0.7 10*9/L (ref 0.3–0.8)
MONOCYTES RELATIVE PERCENT: 10 %
NEUTROPHILS ABSOLUTE COUNT: 3.4 10*9/L (ref 1.8–7.8)
NEUTROPHILS RELATIVE PERCENT: 50.4 %
PLATELET COUNT: 234 10*9/L (ref 150–450)
RED BLOOD CELL COUNT: 4.69 10*12/L (ref 3.95–5.13)
RED CELL DISTRIBUTION WIDTH: 14.9 % (ref 12.2–15.2)
WBC ADJUSTED: 6.8 10*9/L (ref 3.6–11.2)

## 2023-03-11 LAB — IRON PANEL
IRON SATURATION: 13 % — ABNORMAL LOW (ref 20–55)
IRON: 29 ug/dL — ABNORMAL LOW (ref 50–170)
TOTAL IRON BINDING CAPACITY: 231 ug/dL — ABNORMAL LOW (ref 250–425)

## 2023-03-11 LAB — MAGNESIUM: MAGNESIUM: 2.3 mg/dL (ref 1.6–2.6)

## 2023-03-11 LAB — PHOSPHORUS: PHOSPHORUS: 3.7 mg/dL (ref 2.4–5.1)

## 2023-03-11 MED ADMIN — cefdinir (OMNICEF) capsule 300 mg: 300 mg | ORAL | @ 02:00:00 | Stop: 2023-03-24

## 2023-03-11 MED ADMIN — buprenorphine 20 mcg/hour transdermal patch 1 patch: 1 | TRANSDERMAL | @ 18:00:00

## 2023-03-11 MED ADMIN — acetaminophen (TYLENOL) tablet 1,000 mg: 1000 mg | ORAL | @ 02:00:00

## 2023-03-11 MED ADMIN — gadopiclenol (ELUCIREM,VUEWAY) injection 5 mL: 5 mL | INTRAVENOUS | @ 17:00:00 | Stop: 2023-03-11

## 2023-03-11 MED ADMIN — HYDROmorphone (PF) (DILAUDID) injection 1 mg: 1 mg | INTRAVENOUS | @ 07:00:00 | Stop: 2023-03-24

## 2023-03-11 MED ADMIN — promethazine (PHENERGAN) 12.5 mg in sodium chloride (NS) 0.9 % 50 mL IVPB: 12.5 mg | INTRAVENOUS | @ 21:00:00

## 2023-03-11 MED ADMIN — sucralfate (CARAFATE) oral suspension: 1 g | ORAL | @ 12:00:00

## 2023-03-11 MED ADMIN — sodium chloride (NS) 0.9 % flush 10 mL: 10 mL | INTRAVENOUS | @ 03:00:00

## 2023-03-11 MED ADMIN — famotidine (PEPCID) tablet 20 mg: 20 mg | ORAL | @ 02:00:00

## 2023-03-11 MED ADMIN — acetaminophen (TYLENOL) tablet 1,000 mg: 1000 mg | ORAL | @ 20:00:00

## 2023-03-11 MED ADMIN — lidocaine (ASPERCREME) 4 % 1 patch: 1 | TRANSDERMAL | @ 18:00:00

## 2023-03-11 MED ADMIN — oxyCODONE (ROXICODONE) 5 mg/5 mL solution 15 mg: 15 mg | ORAL | @ 20:00:00 | Stop: 2023-03-14

## 2023-03-11 MED ADMIN — scopolamine (TRANSDERM-SCOP) 1 mg over 3 days topical patch 1 mg: 1 | TOPICAL | @ 18:00:00

## 2023-03-11 MED ADMIN — ondansetron (ZOFRAN) injection 8 mg: 8 mg | INTRAVENOUS | @ 07:00:00

## 2023-03-11 MED ADMIN — oxyCODONE (ROXICODONE) 5 mg/5 mL solution 15 mg: 15 mg | ORAL | @ 14:00:00 | Stop: 2023-03-14

## 2023-03-11 MED ADMIN — nirogacestat (OGSIVEO) tablet 150 mg **PATIENT SUPPLIED**: 150 mg | ORAL | @ 02:00:00

## 2023-03-11 MED ADMIN — acetaminophen (TYLENOL) tablet 1,000 mg: 1000 mg | ORAL | @ 12:00:00

## 2023-03-11 MED ADMIN — HYDROmorphone (PF) (DILAUDID) injection 1 mg: 1 mg | INTRAVENOUS | @ 03:00:00 | Stop: 2023-03-24

## 2023-03-11 MED ADMIN — HYDROmorphone (PF) (DILAUDID) injection 1 mg: 1 mg | INTRAVENOUS | @ 22:00:00 | Stop: 2023-03-24

## 2023-03-11 MED ADMIN — HYDROmorphone (PF) (DILAUDID) injection 1 mg: 1 mg | INTRAVENOUS | @ 18:00:00 | Stop: 2023-03-24

## 2023-03-11 MED ADMIN — sucralfate (CARAFATE) oral suspension: 1 g | ORAL | @ 20:00:00

## 2023-03-11 MED ADMIN — emollient combination no.92 (LUBRIDERM) lotion: TOPICAL | @ 20:00:00

## 2023-03-11 MED ADMIN — oxyCODONE (ROXICODONE) 5 mg/5 mL solution 15 mg: 15 mg | ORAL | @ 05:00:00 | Stop: 2023-03-14

## 2023-03-11 MED ADMIN — pantoprazole (Protonix) EC tablet 40 mg: 40 mg | ORAL | @ 20:00:00

## 2023-03-11 MED ADMIN — cefdinir (OMNICEF) capsule 300 mg: 300 mg | ORAL | @ 18:00:00 | Stop: 2023-03-24

## 2023-03-11 MED ADMIN — mirtazapine (REMERON) tablet 7.5 mg: 7.5 mg | ORAL | @ 02:00:00

## 2023-03-11 MED ADMIN — HYDROmorphone (PF) (DILAUDID) injection 1 mg: 1 mg | INTRAVENOUS | @ 12:00:00 | Stop: 2023-03-24

## 2023-03-11 MED ADMIN — escitalopram oxalate (LEXAPRO) tablet 5 mg: 5 mg | ORAL | @ 14:00:00

## 2023-03-11 MED ADMIN — ondansetron (ZOFRAN) injection 8 mg: 8 mg | INTRAVENOUS | @ 18:00:00

## 2023-03-11 MED ADMIN — sucralfate (CARAFATE) oral suspension: 1 g | ORAL | @ 02:00:00

## 2023-03-11 MED ADMIN — nirogacestat (OGSIVEO) tablet 150 mg **PATIENT SUPPLIED**: 150 mg | ORAL | @ 18:00:00

## 2023-03-11 MED ADMIN — triamcinolone (KENALOG) 0.1 % ointment: TOPICAL | @ 18:00:00

## 2023-03-11 MED ADMIN — promethazine (PHENERGAN) 12.5 mg in sodium chloride (NS) 0.9 % 50 mL IVPB: 12.5 mg | INTRAVENOUS

## 2023-03-11 NOTE — Unmapped (Signed)
Luminal Gastroenterology Consult Service   Initial Consultation         Assessment and Recommendations:   [name] [name] is a 23 y.o. 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 The patient is seen in consultation at the request of Hewitt Blade, MD (Oncology/Hematology (MDE)) for gastrointestinal bleeding, with concern for recurrent pouchitis vs pouch ulceration.    Pt presents with a constellation of GI symptoms, the most of concern to her at this time include increased abdominal pain, hemotochezia, and increased bowel frequency. She also has a known history of iron deficiency, with sensitivities to multiple iron formulations.     Today we did discuss that given her history of familial adenomatous polyposis, risk of adenomas in the small bowel, and normal upper endoscopy in 01/2022, she is next due for upper endoscopy 01/2026.   We discussed that the clot that she removed from the feeding tube when that was in place was likely caused from tube irritation of the stomach, and does not necessitate upper endoscopy now.  We discussed how hematochezia as she is seeing that would not be from a source reachable by upper endoscopy.  All reasons not to have upper endoscopy tomorrow along with the pouch.    Hematochezia - history of ileocolonic anastomotic ulcer     She reports onset of hematochezia several weeks ago, present now with 90% of BM. Baseline Hgb appears to be ~13 with admission Hgb being 13.2 and most recent Hgb now 12.3. Given reported symptoms and previous episodes of ulcerations at ileocolonic anastamosis and fissures differential considerations include recurrent anastomotic ulcerations vs fissure vs pouchitis.   -- Please maintain two large-bore (16-18G) peripheral IVs at all times  -- Keep type & screen active  -- Follow the patient's Hgb every 12-24 hours and transfuse for Hgb <7  -- Stop any NSAIDs, aspirin, antiplatelets, and anticoagulants  -- Plan for  pouchoscopy likely 1/2 or 1/3    Increase bowel frequency - history of pouchitis   Pt reports increased bowel frequency up to 15-20 times daily with associated day time incontinence and urgency. This is reminiscent of previous episodes of pouchitis. Most recently on cefdinir a few weeks ago, but for shortened course. Not currently on antibiotics. She has history of multiple antibiotic intolerances in the past, but previously done well with cefdinir or doxycycline. Low concern for acute infectious etiology. Bowel frequency also increased in the hosptial on movantik -- started to help prevent against opioid induced ileus. Suspect she has recurrent pouchitis. Lexapro was also recently started -- unclear timing of this relative to symptoms; if this were to be a side effect, would expect this to be shortlived especially on such a low dose.   -- start cefdinir 300mg  BID   -- pouchoscopy likely 1/2 as above   -- continue to hold movantik while having frequent bowel movements     Recurrent iron deficiency   Pt is high risk of iron deficiency with pouch in place and history of poor nutritional status. She has history of intolerance to multiple iron formulations and requires special protocol for transfusions. Although she is not currently anemic suspect she has recurrent deficiency, if not present now, certainly evolving in the setting of described blood loss and poor po intake.   -- repeat iron labs: iron/TIBC,  ferritin   -- iron transfusion     Hx of poor PO intake, improving   Pt has been working closely with nutrition as an outpatient. We commend her progress with focusing on PO intake. She expresses complete lack of desire to start TPN or enteric feedings and would like to focus on continued PO intake. We support and encourage pursuing exclusive PO intake in line with her goals and would not recommend any additional interventions at this time.     GI Pre-Procedure Checklist  Procedure:  Pouch exam  Anticipated Date of Procedure: 1/2 or 1/3   Anticoagulants/Antiplatelets: Not Applicable  Anesthesia Concerns: None    Issues Impacting Complexity of Management:  -None    Recommendations discussed with the patient's primary team. We will continue to follow along with you.    Shela Leff, MD  Aleatha Borer, MD MSc, Professor  Gastroenterology and Hepatology, [facility]      History of Present Illness:   Pt presents with several recurrent GI symptoms. She reports she had been doing well following her most recent discharge 07/2022 -- working with a broad health team, including GI, oncology, pain management, and nutrition among others. She had been able to wean opioids and had been tolerating food by mouth to the point where her corpak could be removed several weeks ago.     She reports however over the past few weeks she has experienced increased fequency of BM, recurent rectal bleeding, and episodes of incontinence. She does also report however that she has had success with PO intake and would not want to return to enteral feeding with corpak.    She reports experiencing up to 15-20 BM in 24 hours, about 90% of which have some blood -- at first described as red blood, then modified to be more maroon.   She has had multiple episodes of incontinence both during the day and at night.   She reports increased abdominal pain associated with increase BM frequency.     She indicates she has ongoing nausea. She has not vomited.   She denies any fevers or chills. No recent sick contacts.     She has been more stressed with school which she and her mom initially thought was contributing to her worsening symptoms. She has also recently been diagnosed with PTSD which added another layer of stress, started on lexapro at low dose.     Last antibiotics to treat pouchitis was several weeks ago -- but for shortened course.   She was taking movantik PRN but none recently prior to this hospitalization.     She is currently on nirogacestat (gamma secretase inhibitor) to treat her desmoid tumors. This started 09/2022. She was last seen by oncology describing the above symptoms 12/17.    Since admission she has been HDS. Pain team has been following and managing multimodal pain treatment. She was restarted on movantik which has significantly increased bowel frequency, already going 8 times today.           Objective:   Temp:  [36.5 ??C (97.7 ??F)-37 ??C (98.6 ??F)] 37 ??C (98.6 ??F)  Heart Rate:  [59-78] 78  Resp:  [16-18] 18  BP: (103-120)/(53-79) 112/72  SpO2:  [93 %-100 %] 98 %    Gen: chronically ill appearing female patient in NAD, answers questions appropriately  Abdomen: Soft, NTND, well healed surgical scares   Extremities: No edema in the BLEs    Pertinent Labs/Studies:  -I have reviewed the patient's labs from 03/10/23  which show stable Hgb and stable renal function (SCr, electrolytes)    Relevant work up:  MRI 03/08/2023   Ill defined soft tissue in central lower mesentery, with associated small bowel tethering; grossly similar dilation of bowel proximal to anastomosis  Findings suggestive of iron deposition within the liver      Pouch exam 07/2022 - normal ileum, rectal cuff appeared normal with the exception of small rectal polpys, which were treated with APC   Pouch exam 03/2022 - friable mucosa a ileoanal pouch suture line, treated with APC   Pouch exam 02/2022 - anal fissure, 10mm circumferential ulcer oozing at the ileocolonic anastomosis, treated with APC   Pouch exam 01/2022 - anal fissure, ulcer at ileocolonic anastomosis   EGD 01/2022 - multple polyps in the stomach, normal esophagus, duodenum and jejunum   EGD 08/2021- fundic gland polyps in the stomach, otherwise normal  EGD 2022- fundic gland polyps in the stomach, adenomatous tissue removed from small bowel on random biopsy to rule out celiac disease (none seen endoscopically)

## 2023-03-11 NOTE — Unmapped (Signed)
VSS and afebrile. Pt receiving Dilaudid IV x3 and oxycodone x2 overnight for pain. Pt also given phenergan and zofran for nausea. Pt's mother at bedside staying the night. Remains free of falls.    Problem: Adult Inpatient Plan of Care  Goal: Plan of Care Review  Outcome: Ongoing - Unchanged  Goal: Patient-Specific Goal (Individualized)  Outcome: Ongoing - Unchanged  Goal: Absence of Hospital-Acquired Illness or Injury  Outcome: Ongoing - Unchanged  Intervention: Identify and Manage Fall Risk  Recent Flowsheet Documentation  Taken 03/11/2023 0639 by Jerrel Ivory, RN  Safety Interventions:   low bed   fall reduction program maintained   family at bedside  Taken 03/11/2023 0214 by Jerrel Ivory, RN  Safety Interventions:   low bed   family at bedside   fall reduction program maintained   nonskid shoes/slippers when out of bed  Taken 03/11/2023 0026 by Jerrel Ivory, RN  Safety Interventions:   low bed   family at bedside   fall reduction program maintained  Taken 03/10/2023 2230 by Jerrel Ivory, RN  Safety Interventions:   low bed   fall reduction program maintained   family at bedside  Taken 03/10/2023 2140 by Jerrel Ivory, RN  Safety Interventions:   fall reduction program maintained   family at bedside   low bed   chemotherapeutic agent precautions   nonskid shoes/slippers when out of bed  Intervention: Prevent Skin Injury  Recent Flowsheet Documentation  Taken 03/11/2023 0639 by Jerrel Ivory, RN  Positioning for Skin: Supine/Back  Taken 03/11/2023 0026 by Jerrel Ivory, RN  Positioning for Skin: Supine/Back  Taken 03/10/2023 2140 by Jerrel Ivory, RN  Positioning for Skin: Supine/Back  Goal: Optimal Comfort and Wellbeing  Outcome: Ongoing - Unchanged  Goal: Readiness for Transition of Care  Outcome: Ongoing - Unchanged  Goal: Rounds/Family Conference  Outcome: Ongoing - Unchanged

## 2023-03-11 NOTE — Unmapped (Signed)
=  Department of Anesthesiology  Pain Medicine Division    Chronic Pain inpatient Consult follow up Note      Requesting Attending Physician:  [name] Blade, MD  Service Requesting Consult:  Oncology/Hematology (MDE)    Assessment/Recommendations:  [name] [name] is a 23 year old female with a complex medical history, including DVT (RUE), [name] syndrome (post-colectomy), desmoid tumors, anemia, and severe protein-calorie malnutrition. She is followed at [facility] for chronic abdominal and right shoulder pain, consistent with cancer-associated pain. 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 [name] Riverss pain has worsened in recent months, likely due to intermittent intestinal obstructions related to her mesenteric desmoid tumors, which may cause intermittent bowel intussusception. Her pain is further complicated by narcotic bowel syndrome, visceral hyperalgesia, and a positive feedback loop where increased opioid use exacerbates the pain. [name] Riverss most recent hospitalization was complicated by ileus, requiring NG tube, a prolonged inpatient rehab stay.     Recently, [name] [name] had been seen by Dr. Manson [name] in December 2024, who decided to switch her from Suboxone to Nathan Littauer [name] 20 mcg/h patch and continue oxycodone 10 mg 3 times a day with flexibility to increase to 5 times a day for acute pain flares. However, over the past couple of weeks, she has experienced worsening abdominal and low back pain, along with an increase in stool frequency (up to 15 times a day with watery stools for 4 weeks) and some hematochezia.    On 03/08/23, she presented for an outpatient MRI but experienced a flare-up of her chronic pain after completing one of the three planned studies. She was admitted to the [name] emergency department for intractable acute on chronic pain.  Upon evaluation, [name] [name] reported uncontrolled pain, rating her pain as 10/10. She noted that oral oxycodone 10 mg was ineffective, and the night team had not administered IV hydromorphone, which has helped her in the past. She felt she had fallen behind on pain control, leading to worsening symptoms today.    In previous hospitalizations, [name] [name] had received ketamine infusions (5/24, 6/6, 8/1) and a lidocaine infusion (09/28/2022). However, there was inconsistency in reporting the effectiveness of these treatments, making it difficult to assess their true benefit.     I contacted [name] primary pain provider, Dr. Manson [name], who recommended the following treatment plan:  Continue Butrans patch at 20 mcg/h.  Increase oxycodone liquid to 15 mg every 6 hours, or up to every 4 hours as needed for pain.  Gradually wean off IV hydromorphone.  Avoid ketamine infusion at this time.  This plan was discussed with [name] [name], who agreed to the recommendations. The primary admitting team was present during the discussion.    We have also advised [name] [name] to discuss with Dr. Manson [name] to create an ED/Admission care plan to streamline her care, upon her presentation to the [name] in the future. To which [name] [name] and her mother agreeable to.     Interval: Patient's mother at bedside thinks pain is about same however patient did have issue getting her pain medication yesterday.  Pain continues to be severe at times, still 10/10. Pt thinks she slept more than 2 hours last night, but what kept her up mostly was trips to the bathroom due to water diarrhea (almost once every hour) along with abdominal pain as well.  Since she has held her Movantik she has noticed minimal decreasing her frequency of stooling and stool is slightly more formed.  Encouraged to hold Movantik and watch to stool consistency.  Per GI evaluation potential pouchitis.    When I came to see the patient however patient was on a stretcher ready to be transported for her scheduled MRI.    Patient would like to keep her regimen same.    Recommendations:  -The chronic pain service is a consult service and does not place orders, just makes recommendations (except ketamine and lidocaine infusions)  -Please evaluate all patients on opioids for appropriateness of prescribing narcan at discharge.  The chronic pain service can assist with this.  Nasal narcan covered by most insurance.  -Recommendations given apply to the current hospitalization and do not reflect long term recommendations.    Recommended to   -Continue Butrans patch 20 mcg/h q. Weekly  -Continue oxycodone liquid 15 mg every 4 hours as needed   -Continue IV hydromorphone 1 mg every 4 hours as needed     -After MRI if she has severe pain flare not relieved with oxycodone, patient could get additional single dose of 1 mg IV hydromorphone PRN.  -Continue Tylenol 1 g 3 times daily  -Hold movantik (due to diarrhea)    We will continue to follow.    Medications trialed during this admission (or pertinent in past): Hydromorphone IV, oxycodone oral, Butrans patch    Please make sure that patient has prescription of naloxone at discharge.  Nasal narcan for most insured (Nasal narcan 4mg /actuation, prescribe 1 kit, instructions at [email or web address]).  For uninsured, chronic pain can work to assist in finding an option.  OTC nasal narcan now available at most pharmacies for around $45.    History of Present Illness:    Reason for Consult: Acute on chronic abdominal pain    Patient describes the pain in her abdomen sharp stabbing and often cramping diffusely lower right side more pronounced also has chronic pain in her lower buttock area and some pain in her hip area.    Analgesia Evaluation:  Pain at minimum: 8/10  Pain at maximum: 10/10    Prior to admission, the patient was on home opiate medications.  Shehas been followed by a pain clinic.  The [place] CSRS was reviewed.    Allergies    Allergies   Allergen Reactions    Adhesive Tape-Silicones Hives and Rash     Paper tape and Tegederm ok. Biopatch causes blistering but has tolerated CHG Tegaderm    Ferrlecit [Sodium Ferric Gluconat-Sucrose] Swelling and Rash    Levofloxacin Swelling and Rash     Swelling in mouth, rash,     Methylnaltrexone      Per Patient: I lost bowel control, severe abdominal cramping, and elevated BP    Neomycin Swelling     Rxn after ear drops; ear swelling    Papaya Hives    Morphine Nausea And Vomiting    Zosyn [Piperacillin-Tazobactam] Itching and Rash     Red and itchy    Compazine [Prochlorperazine] Other (See Comments)     Extreme agitation    Iron Analogues     Iron Dextran Itching     Received iron dextran 06/08/22 over 12 hours, had itching and redness/flushing during the infusion and for a couple days after. Required IV benadryl w/flares between doses and ultimately treated w/IV methylpred for 2 days.     Latex, Natural Rubber Rash           Inpatient Medications  Current Facility-Administered Medications   Medication Dose Route Frequency Provider Last Rate  Last Admin    acetaminophen (TYLENOL) tablet 1,000 mg  1,000 mg Oral Q8H SCH Gogerly-Moragoda, Ellene Route, MD   1,000 mg at 03/11/23 0636    buprenorphine 20 mcg/hour transdermal patch 1 patch  1 patch Transdermal Q7 Days Emogene Morgan, MD   1 patch at 03/08/23 2340    cefdinir (OMNICEF) capsule 300 mg  300 mg Oral Q12H SCH Garnet Sierras, MD   300 mg at 03/10/23 2126    CHEMO CLARIFICATION ORDER   Other Continuous PRN [name] Blade, MD        emollient combination no.92 (LUBRIDERM) lotion   Topical Q1H PRN Emogene Morgan, MD        escitalopram oxalate (LEXAPRO) tablet 5 mg  5 mg Oral Daily Melvia Heaps D, MD   5 mg at 03/11/23 1610    famotidine (PEPCID) tablet 20 mg  20 mg Oral Nightly Garnet Sierras, MD   20 mg at 03/10/23 2126    fluticasone propionate (FLONASE) 50 mcg/actuation nasal spray 2 spray  2 spray Topical Q7 Days Emogene Morgan, MD   2 spray at 03/09/23 0249    hydrocortisone 2.5 % cream   Topical BID PRN Melvia Heaps D, MD        HYDROmorphone (PF) (DILAUDID) injection Syrg 0.5 mg  0.5 mg Intravenous Q4H PRN Garnet Sierras, MD   0.5 mg at 03/10/23 1346    Or    HYDROmorphone (PF) (DILAUDID) injection 1 mg  1 mg Intravenous Q4H PRN Garnet Sierras, MD   1 mg at 03/11/23 9604    IP OKAY TO TREAT   Other Continuous PRN [name] Blade, MD        lidocaine (ASPERCREME) 4 % 1 patch  1 patch Transdermal Daily Gogerly-Moragoda, Ellene Route, MD        LORazepam (ATIVAN) tablet 1 mg  1 mg Oral TID PRN Emogene Morgan, MD        mirtazapine (REMERON) tablet 7.5 mg  7.5 mg Oral Nightly Gogerly-Moragoda, Ellene Route, MD   7.5 mg at 03/10/23 2126    [Provider Hold] naloxegol (MOVANTIK) tablet 12.5 mg  12.5 mg Oral Daily Melvia Heaps D, MD   12.5 mg at 03/10/23 0836    naloxone (NARCAN) injection 0.4 mg  0.4 mg Intravenous Once PRN Garnet Sierras, MD        nirogacestat (OGSIVEO) tablet 150 mg **PATIENT SUPPLIED**  150 mg Oral BID [name] Blade, MD   150 mg at 03/10/23 2128    ondansetron (ZOFRAN) injection 8 mg  8 mg Intravenous Q6H PRN Garnet Sierras, MD   8 mg at 03/11/23 0214    oxyCODONE (ROXICODONE) 5 mg/5 mL solution 15 mg  15 mg Oral Q4H PRN Garnet Sierras, MD   15 mg at 03/11/23 0921    pantoprazole (Protonix) EC tablet 40 mg  40 mg Oral Daily Garnet Sierras, MD   40 mg at 03/10/23 1346    promethazine (PHENERGAN) 12.5 mg in sodium chloride (NS) 0.9 % 50 mL IVPB  12.5 mg Intravenous Q12H PRN Garnet Sierras, MD   Stopped at 03/10/23 1948    scopolamine (TRANSDERM-SCOP) 1 mg over 3 days topical patch 1 mg  1 patch Topical Q72H Feagins, Dorathy Daft D, MD   1 mg at 03/08/23 2338    sodium chloride (NS) 0.9 % flush 10 mL  10 mL Intravenous BID Melvia Heaps D, MD   10 mL at 03/10/23 [phone]  sodium chloride (NS) 0.9 % flush 10 mL  10 mL Intravenous BID Melvia Heaps D, MD   10 mL at 03/09/23 2018    sodium chloride (NS) 0.9 % flush 10 mL  10 mL Intravenous BID Melvia Heaps D, MD   10 mL at 03/10/23 2204    sodium chloride (NS) 0.9 % infusion  20 mL/hr Intravenous Continuous Emogene Morgan, MD   Held at 03/09/23 0250    sucralfate (CARAFATE) oral suspension  1 g Oral QID Melvia Heaps D, MD   1 g at 03/11/23 0636    triamcinolone (KENALOG) 0.1 % ointment   Topical BID PRN Emogene Morgan, MD               Lab Results   Component Value Date    CREATININE 0.64 03/11/2023       Lab Results   Component Value Date    ALKPHOS 80 03/11/2023    BILITOT 0.3 03/11/2023    BILIDIR 0.10 10/29/2022    PROT 6.3 03/11/2023    ALBUMIN 3.2 (L) 03/11/2023    ALT 11 03/11/2023    AST 12 03/11/2023       Encounter Date: 03/08/23   ECG 12 Lead   Result Value    EKG Systolic BP     EKG Diastolic BP     EKG Ventricular Rate 82    EKG Atrial Rate 82    EKG P-R Interval 142    EKG QRS Duration 88    EKG Q-T Interval 382    EKG QTC Calculation 446    EKG Calculated P Axis 53    EKG Calculated R Axis 39    EKG Calculated T Axis 20    QTC Fredericia 424    Narrative    NORMAL SINUS RHYTHM  NONSPECIFIC T WAVE ABNORMALITY  ABNORMAL ECG  WHEN COMPARED WITH ECG OF 02-Nov-2022 08:33,  NONSPECIFIC T WAVE ABNORMALITY NOW EVIDENT IN ANTERIOR LEADS  Confirmed by Pollyann Kennedy ([id number]) on 03/09/2023 7:31:09 PM       No results found for requested labs within last 30 days.       Objective:     Vital Signs  Temp:  [36.5 ??C (97.7 ??F)-37 ??C (98.6 ??F)] 36.5 ??C (97.7 ??F)  Heart Rate:  [52-78] 52  Resp:  [16-18] 18  BP: (106-122)/(53-72) 122/66  MAP (mmHg):  [73-84] 83  SpO2:  [92 %-100 %] 97 %    Physical Exam    GENERAL:  Well developed, well-nourished female and is in no apparent distress.   HEAD/NECK:    Reveals normocephalic/atraumatic.   CARDIOVASCULAR:   Regular rate  LUNGS:   Normal work of breathing, no supplemental 02  EXTREMITIES:  Warm, no clubbing, cyanosis, or edema was noted.  NEUROLOGIC:    The patient was alert and oriented times four with normal language, attention, cognition and memory. Cranial nerve exam was grossly normal.  MUSCULOSKELETAL:    Moving all 4 extremities.    SKIN:  No obvious rashes lesions or erythema  PSY:  Appropriate affect and mood.      Problem List    Principal Problem:    Chronic pain due to neoplasm  Active Problems:    [name]    Generalized anxiety disorder with panic attacks    GI bleeding    MDD (major depressive disorder)    Dizziness    Fatigue     Medical Decision Making    Problems Addressed:  1 or more chronic illnesses with severe exacerbation, progression, or side effects of treatment (high)-severe pain at times, up to 10/10    Amount/Complexity:  Reviewed external records:  oncology 03/11/23  Reviewed results:  Labs 03/11/23-Cr and LFTs  Ordered tests: None  Assessment requiring independent historian: None  Discussion of management/test results with medical professional: Primary team  Independent interpretation of test: None    Risk  High: Drug therapy requiring intensive monitoring for toxicity, Decision regarding hospitalization, Elective Major Surgery w/ risk factors, Emergency Major Surgery, Decision to DNR or de-escalate care because of poor prognosis, parental controlled substances- IV opioids      I was immediately available during all portions of the patient's visit and discussed the assessment and plan with the fellow.  I reviewed the chart and the fellow's note and agree with the findings and plan. Criss Rosales, MD

## 2023-03-12 LAB — COMPREHENSIVE METABOLIC PANEL
ALBUMIN: 3.3 g/dL — ABNORMAL LOW (ref 3.4–5.0)
ALKALINE PHOSPHATASE: 74 U/L (ref 46–116)
ALT (SGPT): 9 U/L — ABNORMAL LOW (ref 10–49)
ANION GAP: 5 mmol/L (ref 5–14)
AST (SGOT): 13 U/L (ref <=34)
BILIRUBIN TOTAL: 0.2 mg/dL — ABNORMAL LOW (ref 0.3–1.2)
BLOOD UREA NITROGEN: 10 mg/dL (ref 9–23)
BUN / CREAT RATIO: 16
CALCIUM: 9.3 mg/dL (ref 8.7–10.4)
CHLORIDE: 108 mmol/L — ABNORMAL HIGH (ref 98–107)
CO2: 27 mmol/L (ref 20.0–31.0)
CREATININE: 0.63 mg/dL (ref 0.55–1.02)
EGFR CKD-EPI (2021) FEMALE: >90 mL/min/{1.73_m2} (ref >=60)
GLUCOSE RANDOM: 120 mg/dL (ref 70–179)
POTASSIUM: 3.5 mmol/L (ref 3.4–4.8)
PROTEIN TOTAL: 6.3 g/dL (ref 5.7–8.2)
SODIUM: 140 mmol/L (ref 135–145)

## 2023-03-12 LAB — CBC W/ AUTO DIFF
BASOPHILS ABSOLUTE COUNT: 0 10*9/L (ref 0.0–0.1)
BASOPHILS RELATIVE PERCENT: 0.5 %
EOSINOPHILS ABSOLUTE COUNT: 0.4 10*9/L (ref 0.0–0.5)
EOSINOPHILS RELATIVE PERCENT: 5 %
HEMATOCRIT: 36.2 % (ref 34.0–44.0)
HEMOGLOBIN: 12 g/dL (ref 11.3–14.9)
LYMPHOCYTES ABSOLUTE COUNT: 3.2 10*9/L (ref 1.1–3.6)
LYMPHOCYTES RELATIVE PERCENT: 42.3 %
MEAN CORPUSCULAR HEMOGLOBIN CONC: 33.1 g/dL (ref 32.0–36.0)
MEAN CORPUSCULAR HEMOGLOBIN: 26.2 pg (ref 25.9–32.4)
MEAN CORPUSCULAR VOLUME: 79.2 fL (ref 77.6–95.7)
MEAN PLATELET VOLUME: 10.5 fL (ref 6.8–10.7)
MONOCYTES ABSOLUTE COUNT: 0.6 10*9/L (ref 0.3–0.8)
MONOCYTES RELATIVE PERCENT: 8.1 %
NEUTROPHILS ABSOLUTE COUNT: 3.3 10*9/L (ref 1.8–7.8)
NEUTROPHILS RELATIVE PERCENT: 44.1 %
PLATELET COUNT: 228 10*9/L (ref 150–450)
RED BLOOD CELL COUNT: 4.57 10*12/L (ref 3.95–5.13)
RED CELL DISTRIBUTION WIDTH: 15.1 % (ref 12.2–15.2)
WBC ADJUSTED: 7.6 10*9/L (ref 3.6–11.2)

## 2023-03-12 LAB — MAGNESIUM: MAGNESIUM: 2.2 mg/dL (ref 1.6–2.6)

## 2023-03-12 LAB — PHOSPHORUS: PHOSPHORUS: 4.1 mg/dL (ref 2.4–5.1)

## 2023-03-12 MED ADMIN — cetirizine (ZYRTEC) tablet 20 mg: 20 mg | ORAL | @ 03:00:00

## 2023-03-12 MED ADMIN — promethazine (PHENERGAN) 12.5 mg in sodium chloride (NS) 0.9 % 50 mL IVPB: 12.5 mg | INTRAVENOUS

## 2023-03-12 MED ADMIN — HYDROmorphone (PF) (DILAUDID) injection 1 mg: 1 mg | INTRAVENOUS | @ 03:00:00 | Stop: 2023-03-24

## 2023-03-12 MED ADMIN — acetaminophen (TYLENOL) tablet 1,000 mg: 1000 mg | ORAL | @ 18:00:00

## 2023-03-12 MED ADMIN — ondansetron (ZOFRAN) injection 8 mg: 8 mg | INTRAVENOUS | @ 03:00:00

## 2023-03-12 MED ADMIN — pantoprazole (Protonix) EC tablet 40 mg: 40 mg | ORAL | @ 17:00:00

## 2023-03-12 MED ADMIN — oxyCODONE (ROXICODONE) 5 mg/5 mL solution 15 mg: 15 mg | ORAL | @ 01:00:00 | Stop: 2023-03-14

## 2023-03-12 MED ADMIN — polyethylene glycol (GLYCOLAX) powder (BULK CONTAINER) 238 g: 238 g | ORAL | @ 17:00:00 | Stop: 2023-03-12

## 2023-03-12 MED ADMIN — acetaminophen (TYLENOL) tablet 1,000 mg: 1000 mg | ORAL | @ 11:00:00

## 2023-03-12 MED ADMIN — nirogacestat (OGSIVEO) tablet 150 mg **PATIENT SUPPLIED**: 150 mg | ORAL | @ 02:00:00

## 2023-03-12 MED ADMIN — sodium chloride (NS) 0.9 % infusion: 100 mL/h | INTRAVENOUS | @ 19:00:00 | Stop: 2023-03-13

## 2023-03-12 MED ADMIN — cefdinir (OMNICEF) capsule 300 mg: 300 mg | ORAL | @ 14:00:00 | Stop: 2023-03-24

## 2023-03-12 MED ADMIN — acetaminophen (TYLENOL) tablet 1,000 mg: 1000 mg | ORAL | @ 03:00:00

## 2023-03-12 MED ADMIN — promethazine (PHENERGAN) 12.5 mg in sodium chloride (NS) 0.9 % 50 mL IVPB: 12.5 mg | INTRAVENOUS | @ 07:00:00 | Stop: 2023-03-12

## 2023-03-12 MED ADMIN — famotidine (PEPCID) tablet 20 mg: 20 mg | ORAL | @ 03:00:00

## 2023-03-12 MED ADMIN — HYDROmorphone (PF) (DILAUDID) injection 1 mg: 1 mg | INTRAVENOUS | @ 17:00:00 | Stop: 2023-03-24

## 2023-03-12 MED ADMIN — HYDROmorphone (PF) (DILAUDID) injection 1 mg: 1 mg | INTRAVENOUS | @ 11:00:00 | Stop: 2023-03-24

## 2023-03-12 MED ADMIN — oxyCODONE (ROXICODONE) 5 mg/5 mL solution 15 mg: 15 mg | ORAL | @ 07:00:00 | Stop: 2023-03-12

## 2023-03-12 MED ADMIN — sodium chloride (NS) 0.9 % flush 10 mL: 10 mL | INTRAVENOUS | @ 14:00:00

## 2023-03-12 MED ADMIN — cefdinir (OMNICEF) capsule 300 mg: 300 mg | ORAL | @ 03:00:00 | Stop: 2023-03-24

## 2023-03-12 MED ADMIN — sodium chloride (NS) 0.9 % flush 10 mL: 10 mL | INTRAVENOUS | @ 04:00:00

## 2023-03-12 MED ADMIN — HYDROmorphone (PF) (DILAUDID) injection 1 mg: 1 mg | INTRAVENOUS | @ 21:00:00 | Stop: 2023-03-24

## 2023-03-12 MED ADMIN — oxyCODONE (ROXICODONE) 5 mg/5 mL solution 15 mg: 15 mg | ORAL | @ 14:00:00 | Stop: 2023-03-12

## 2023-03-12 MED ADMIN — oxyCODONE (ROXICODONE) 5 mg/5 mL solution 20 mg: 20 mg | ORAL | @ 22:00:00 | Stop: 2023-03-17

## 2023-03-12 MED ADMIN — ondansetron (ZOFRAN) injection 8 mg: 8 mg | INTRAVENOUS | @ 17:00:00

## 2023-03-12 MED ADMIN — cetirizine (ZYRTEC) tablet 20 mg: 20 mg | ORAL | @ 14:00:00

## 2023-03-12 MED ADMIN — saliva stimulant mucosal spray (BIOTENE): 1 | TOPICAL | @ 18:00:00

## 2023-03-12 MED ADMIN — nirogacestat (OGSIVEO) tablet 150 mg **PATIENT SUPPLIED**: 150 mg | ORAL | @ 14:00:00

## 2023-03-12 MED ADMIN — ondansetron (ZOFRAN) injection 8 mg: 8 mg | INTRAVENOUS | @ 11:00:00 | Stop: 2023-03-12

## 2023-03-12 MED ADMIN — escitalopram oxalate (LEXAPRO) tablet 5 mg: 5 mg | ORAL | @ 14:00:00

## 2023-03-12 NOTE — Unmapped (Signed)
VENOUS ACCESS TEAM PROCEDURE    Nurse request was placed for a PIV by Venous Access Team (VAT).  Pt has gone through multiple IV in 3 days. RN was told pt needed two PIV at all time. Spoke with Melvia Heaps MD (MDE) via epic chat. MD stated it was ok to have one patent IV at this time. Pt is not on any continuous gtt at this time. RN will replace when needed.       Workup / Procedure Time:  30 minutes       Primary RN was notified.       Thank you,     Fredderick Erb, RN Venous Access Team

## 2023-03-12 NOTE — Unmapped (Signed)
Oncology (MEDO) Progress Note    Assessment & Plan:   [name] [name] is a 23 y.o. female whose presentation is complicated by [name] c/b desmoid fibromatomsis, recurrent ileus, GI bleeding, complex pain, PTSD secondary to chronic medical condition and multiple healthcare interactions who presented to [facility] with refractory pain in setting of large tumor burden.     Principal Problem:    Chronic pain due to neoplasm  Active Problems:    [name]    Generalized anxiety disorder with panic attacks    GI bleeding    MDD (major depressive disorder)    Dizziness    Fatigue    Active Problems    Acute on Chronic Generalized & Abdominal Pain - [name] Burden  The patient follows with [facility] pain management, most recently was seen in clinic in December 2024. It has been documented that IV opioids likely precipitate narcotic bowel syndrome and generates a positive feedback loop. Patient with multiple instances of ileus while hospitalized due to high requirement of opioids. She has been placed on an IV lidocaine drip and IV ketamine in the past for uncontrolled pain, and has been noted by her outpatient pain providers that this would be recommended for future inpatient treatments (see pain management note 02/18/23). She was evaluated by chronic pain and decided to avoid ketamine at this time as she only has one more session available before May 2025. Holding off on lidocaine infusion to see if increased oral opioids dose would be effective as is patient's preference.   - Evaluated by chronic pain team, appreciate recs   - Tylenol 1000 mg 3 times daily scheduled  - Oxycodone 15 mg oral solution q4h PRN, plan to space out as able   - Dilaudid 1 mg q4h PRN  - Will discuss with pain team regarding possible escalation of IV meds during bowel prep as this has previously worsened her pain   - Butrans patch   - Lidocaine patch    Dizziness - Fatigue - Malnutrition - Diarrhea   Hematochezia - Anastomotic ulcers   Patient reports recent dizziness upon standing and generalized feeling of malaise. Notes 15-20 stools per day this week, increase from baseline of 10 stools per day. She reports she has lost 5lbs over the past week or so. Of note, she did have her corpak removed early December due to blockage. Was on TF regimen Osmolite 1.5 as tolerated, 50-75 mL/h. No objective evidence by imaging or endoscopic findings to suggest etiology of inability to tolerate PO intake. Follows with nutrition outpatient. Suspect patient is orthostatic 2/2 poor PO intake and increased stools which are bloody in nature. She has a known history of chronic GI bleeding from anastomotic ulcer. Not anemic however see below for potential iron deficiency.   - GI consulted, appreciate recs   - Cefdinir 300 mg BID    - Plan for potential pouchoscopy on 1/3  - 1/2 AM clear liquid diet, PM start bowel prep with miralax in 2 L apple juice or water (can do combo)   - IV iron if needed, studies pending    - Hold naloxegol   - encourage PO  - nutrition consult   - Pantoprazole 40 mg nightly (noon)  - Famotine 20 mg nightly (scheduled for 10pm)      Iron deficiency anemia - History of DVT   Follows with [facility]. History of anaphylaxis to IV iron and will need pre and post medication during any treatments. Most recently received  FeraHeme during August 2024 hospitalization with rash/itching but overall was able to complete infusion. Ferritin 37, Tsat 13, iron 29. Plan for IV iron this admission with pre-medication.   - monitor Hgb daily   - SCDs in place   - Start cetirizine 20 mg BID on 03/11/23 PM for pre-treatment   - Treatment plan for IV iron (per Allergy note 09/24/22):    Iron formulation:  - Use Ferric Derisolmaltose (monoferric) with a slow rate of infusion (infuse no more than 0.5g/hr initially).     Pretreatment:  Antihistamines:  - pretreat with antihsitamines the entire WEEK prior to infusion, cetirizine 20 mg (2 tablets) twice daily.   - on the day of infusion, cetirizine 20 qAM, then IV benadryl 50mg  and famotidine 40 mg IV 30 minutes prior to infusion, then cetirizine 20 mg qPM  - Continue cetirizine 20 mg BID for 72 hours after infusion     Fluids:   - I would also start hydration 30 minutes prior to the infusion, with IV fluids     Steroids:  - I would also do a steroid pretreatment with 50mg  prednisone 13 hours prior to the infusion, 50mg  prednisone 7 hours prior to the infusion, then the solumedrol dose 40mg  (IV dose) prior to the infusion.     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 [name].  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  Was most recently seen on 12/17 at which time nirogacestat was continued.  Of note, outpatient MRI was scheduled for restaging of desmoid tumor which was partially completed today. Patient reports she was only able to tolerate the abdomen and pelvis. Her pain was too great to continue.  - Notified Dr.Stein of admission  - Nirogacestat while in hospital, patient supplied and verified by pharmacy (cannot be within 2 hours of pantoprazole or famotidine)   - Outpatient MRI scans obtained inpatient on 03/11/23, read pending     Hypercalcemia (resolved)   Total calcium of 10.8. Asymptomatic. iCal normal at 4.66.    PTSD - Generalized Anxiety Disorder - Major Depressive Disorder  PTSD secondary to complex medical condition and interactions with medical system. Follows with [facility].   - continue nightly Mirtazapine 7.5 mg   - Escitalopram 5 mg daily     Issues Impacting Complexity of Management:  -Parenteral controlled medications: IV Dilaudid    Daily Checklist:  Diet: Regular Diet  DVT PPx: SCDs  Electrolytes: Replete Potassium to >/= 3.6 and Magnesium to >/= 1.8  Code Status: Full Code  Dispo: Home pending improvement of pain     Team Contact Information:   Primary Team: Oncology (MEDO)  Primary Resident: Garnet Sierras, MD  Resident's Pager: [phone] (Oncology Intern - Alvester Morin)    Interval History:   Pain was less controlled after MRI as she had to lay flat on the hard table for an extended period. Discussed possible additional dose of IV dilaudid however she was within window to get her regular PRN IV dilaudid instead.     Still eating poorly and having watery diarrhea. Family had brought food in and she was able to eat some with the help of  anti-nausea medications.     Objective:   Temp:  [36.5 ??C (97.7 ??F)-37 ??C (98.6 ??F)] 36.8 ??C (98.2 ??F)  Heart Rate:  [52-78] 71  Resp:  [18] 18  BP: (108-135)/(65-78) 108/72  SpO2:  [92 %-100 %] 98 %    Gen: NAD  HENT: atraumatic, normocephalic  Heart: regular rate on tele   Lungs: normal work of breathing on room air   Abdomen: deferred  Extremities: No edema

## 2023-03-12 NOTE — Unmapped (Signed)
Oncology (MEDO) Progress Note    Assessment & Plan:   [name] [name] is a 23 y.o. female whose presentation is complicated by [name] c/b desmoid fibromatomsis, recurrent ileus, GI bleeding, complex pain, PTSD secondary to chronic medical condition and multiple healthcare interactions who presented to [facility] with refractory pain in setting of large tumor burden.     Principal Problem:    Chronic pain due to neoplasm  Active Problems:    [name]    Generalized anxiety disorder with panic attacks    GI bleeding    MDD (major depressive disorder)    Dizziness    Fatigue    Active Problems    Acute on Chronic Generalized & Abdominal Pain - [name] Burden  The patient follows with [facility] pain management, most recently was seen in clinic in December 2024. It has been documented that IV opioids likely precipitate narcotic bowel syndrome and generates a positive feedback loop. Patient with multiple instances of ileus while hospitalized due to high requirement of opioids. She has been placed on an IV lidocaine drip and IV ketamine in the past for uncontrolled pain, and has been noted by her outpatient pain providers that this would be recommended for future inpatient treatments (see pain management note 02/18/23). She was evaluated by chronic pain and decided to avoid ketamine at this time as she only has one more session available before May 2025. Holding off on lidocaine infusion to see if increased oral opioids dose would be effective as is patient's preference.   - Evaluated by chronic pain team, appreciate recs   - Tylenol 1000 mg 3 times daily scheduled  - Oxycodone 15 mg oral solution q4h PRN, plan to space out as able   - Dilaudid 1 mg q4h PRN  - Will discuss with pain team regarding potential escalation of pain meds during bowel prep  - Butrans patch   - Lidocaine patch    Dizziness - Fatigue - Malnutrition - Diarrhea   Hematochezia - Anastomotic ulcers   Patient reports recent dizziness upon standing and generalized feeling of malaise. Notes 15-20 stools per day this week, increase from baseline of 10 stools per day. She reports she has lost 5lbs over the past week or so. Of note, she did have her corpak removed early December due to blockage. Was on TF regimen Osmolite 1.5 as tolerated, 50-75 mL/h. No objective evidence by imaging or endoscopic findings to suggest etiology of inability to tolerate PO intake. Follows with nutrition outpatient. Suspect patient is orthostatic 2/2 poor PO intake and increased stools which are bloody in nature. She has a known history of chronic GI bleeding from anastomotic ulcer. Not anemic however see below for potential iron deficiency.   - GI consulted, appreciate recs   - Cefdinir 300 mg BID    - Plan for pouchoscopy on 1/3, NPO midnight   - 1/2 AM clear liquid diet, PM start bowel prep with miralax in 2 L apple juice or water (can do combo)  - NS 100cc/hr for 10 hours during prep as she has had prior rapid responses for syncope during bowel preps and required fluids    - Hold naloxegol   - encourage PO  - nutrition consult   - Pantoprazole 40 mg nightly (noon)  - Famotine 20 mg nightly (scheduled for 10pm)     Iron deficiency anemia - History of DVT   Follows with [facility]ology. History of anaphylaxis to IV iron and will need pre and  post medication during any treatments. Most recently received FeraHeme during August 2024 hospitalization with rash/itching but overall was able to complete infusion. Ferritin 37, Tsat 13, iron 29. Plan for IV iron this admission with pre-medication.   - monitor Hgb daily   - SCDs in place   - Start cetirizine 20 mg BID on 03/11/23 PM for pre-treatment   - Treatment plan for IV iron (per Allergy note 09/24/22):     Pretreatment:  Antihistamines:  - pretreat with antihsitamines the entire WEEK prior to infusion, cetirizine 20 mg (2 tablets) twice daily.   - on the day of infusion, cetirizine 20 qAM, then IV benadryl 50mg  and famotidine 40 mg IV 30 minutes prior to infusion, then cetirizine 20 mg qPM  - Continue cetirizine 20 mg BID for 72 hours after infusion     Fluids:   - I would also start hydration 30 minutes prior to the infusion, with IV fluids     Steroids:  - I would also do a steroid pretreatment with 50mg  prednisone 13 hours prior to the infusion, 50mg  prednisone 7 hours prior to the infusion, then the solumedrol dose 40mg  (IV dose) prior to the infusion.     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 [name].  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  Was most recently seen on 12/17 at which time nirogacestat was continued.  Of note, outpatient MRI was scheduled for restaging of desmoid tumor which was partially completed today. Patient reports she was only able to tolerate the abdomen and pelvis. Her pain was too great to continue.  - Notified Dr.Stein of admission  - Nirogacestat while in hospital, patient supplied and verified by pharmacy (cannot be within 2 hours of pantoprazole or famotidine)   - Outpatient MRI scans obtained inpatient on 03/11/23    Hypercalcemia (resolved)   Total calcium of 10.8. Asymptomatic. iCal normal at 4.66.    PTSD - Generalized Anxiety Disorder - Major Depressive Disorder  PTSD secondary to complex medical condition and interactions with medical system. Follows with [facility].   - continue nightly Mirtazapine 7.5 mg   - Escitalopram 5 mg daily     Issues Impacting Complexity of Management:  -Parenteral controlled medications: IV Dilaudid    Daily Checklist:  Diet: Regular Diet  DVT PPx: SCDs  Electrolytes: Replete Potassium to >/= 3.6 and Magnesium to >/= 1.8  Code Status: Full Code  Dispo: Home pending improvement of pain     Team Contact Information:   Primary Team: Oncology (MEDO)  Primary Resident: Garnet Sierras, MD  Resident's Pager: [phone] (Oncology Intern - Alvester Morin)    Interval History:   Pain is somewhat controlled. She notes that her pain always worsens when she does bowel prep and she is worried about her pain this afternoon. Discussed that the oral liquid oxy should still work even with the bowel prep and chronic pain team will see her this afternoon to come up with pain plan.    Objective:   Temp:  [36.7 ??C (98.1 ??F)-36.8 ??C (98.2 ??F)] 36.8 ??C (98.2 ??F)  Heart Rate:  [58-74] 67  Resp:  [17-18] 17  BP: (107-135)/(63-78) 121/74  SpO2:  [92 %-99 %] 98 %    Gen: NAD  HENT: atraumatic, normocephalic  Heart: regular rate on tele  Lungs: normal work of breathing on room air   Abdomen: deferred  Extremities: deferred

## 2023-03-12 NOTE — Unmapped (Signed)
 VENOUS ACCESS TEAM PROCEDURE    Nurse request was placed for a PIV by Venous Access Team (VAT).  Patient was assessed at bedside for placement of a PIV. PPE were donned per protocol.  Access was obtained. Blood return noted.  Dressing intact and device well secured.  Flushed with normal saline.  See LDA for details.  Pt advised to inform RN of any s/s of discomfort at the PIV site.    Workup / Procedure Time:  15 minutes       Primary RN was notified.       Thank you,     Bo Merino, RN Venous Access Team

## 2023-03-12 NOTE — Unmapped (Signed)
[name] [name] remains adherent to treatment/medication regimen.  Pain and nausea continue to be an issue (oral and IV pain prns alternated/Zofran and phenergan administered as well). A&Ox4, afebrile with stable vital signs.  Ambulated in room but still not stable on her feet (stand-by assist).  Family remains supportive at bedside.      Problem: Adult Inpatient Plan of Care  Goal: Plan of Care Review  Outcome: Progressing  Goal: Patient-Specific Goal (Individualized)  Outcome: Progressing  Goal: Absence of Hospital-Acquired Illness or Injury  Outcome: Progressing  Intervention: Identify and Manage Fall Risk  Recent Flowsheet Documentation  Taken 03/12/2023 0400 by Ennis Forts, RN  Safety Interventions: family at bedside  Taken 03/12/2023 0200 by Ennis Forts, RN  Safety Interventions: family at bedside  Taken 03/12/2023 0000 by Ennis Forts, RN  Safety Interventions: family at bedside  Taken 03/11/2023 2200 by Ennis Forts, RN  Safety Interventions: low bed  Taken 03/11/2023 2023 by Ennis Forts, RN  Safety Interventions:   family at bedside   fall reduction program maintained   lighting adjusted for tasks/safety   low bed   nonskid shoes/slippers when out of bed   room near unit station  Goal: Optimal Comfort and Wellbeing  Outcome: Progressing  Goal: Readiness for Transition of Care  Outcome: Progressing  Goal: Rounds/Family Conference  Outcome: Progressing     Problem: Pain Acute  Goal: Optimal Pain Control and Function  Outcome: Progressing     Problem: Latex Allergy  Goal: Absence of Allergy Symptoms  Outcome: Progressing     Problem: Oncology Care  Goal: Effective Coping  Outcome: Progressing  Goal: Improved Activity Tolerance  Outcome: Progressing  Goal: Optimal Oral Intake  Outcome: Progressing  Goal: Improved Oral Mucous Membrane Integrity  Outcome: Progressing  Goal: Optimal Pain Control and Function  Outcome: Progressing     Problem: Infection  Goal: Absence of Infection Signs and Symptoms  Outcome: Progressing  Intervention: Prevent or Manage Infection  Recent Flowsheet Documentation  Taken 03/11/2023 2023 by Ennis Forts, RN  Isolation Precautions: protective precautions maintained     Problem: Self-Care Deficit  Goal: Improved Ability to Complete Activities of Daily Living  Outcome: Progressing     Problem: Malnutrition  Goal: Improved Nutritional Intake  Outcome: Progressing     Problem: Fall Injury Risk  Goal: Absence of Fall and Fall-Related Injury  Outcome: Progressing  Intervention: Promote Injury-Free Environment  Recent Flowsheet Documentation  Taken 03/12/2023 0400 by Ennis Forts, RN  Safety Interventions: family at bedside  Taken 03/12/2023 0200 by Ennis Forts, RN  Safety Interventions: family at bedside  Taken 03/12/2023 0000 by Ennis Forts, RN  Safety Interventions: family at bedside  Taken 03/11/2023 2200 by Ennis Forts, RN  Safety Interventions: low bed  Taken 03/11/2023 2023 by Ennis Forts, RN  Safety Interventions:   family at bedside   fall reduction program maintained   lighting adjusted for tasks/safety   low bed   nonskid shoes/slippers when out of bed   room near unit station

## 2023-03-12 NOTE — Unmapped (Signed)
=  Department of Anesthesiology  Pain Medicine Division    Chronic Pain inpatient Consult follow up Note      Requesting Attending Physician:  Hewitt Blade, MD  Service Requesting Consult:  Oncology/Hematology (MDE)    Assessment/Recommendations:  [name] [name] is a 23 year old female with a complex medical history, including DVT (RUE), [name] syndrome (post-colectomy), desmoid tumors, anemia, and severe protein-calorie malnutrition. She is followed at [facility] for chronic abdominal and right shoulder pain, consistent with cancer-associated pain. 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 [name]???s pain has worsened in recent months, likely due to intermittent intestinal obstructions related to her mesenteric desmoid tumors, which may cause intermittent bowel intussusception. Her pain is further complicated by narcotic bowel syndrome, visceral hyperalgesia, and a positive feedback loop where increased opioid use exacerbates the pain. Tieara???s most recent hospitalization was complicated by ileus, requiring NG tube, a prolonged inpatient rehab stay.     Recently, [name] had been seen by [name] in December 2024, who decided to switch her from Suboxone to [facility] 20 mcg/h patch and continue oxycodone 10 mg 3 times a day with flexibility to increase to 5 times a day for acute pain flares. However, over the past couple of weeks, she has experienced worsening abdominal and low back pain, along with an increase in stool frequency (up to 15 times a day with watery stools for 4 weeks) and some hematochezia.    On 03/08/23, she presented for an outpatient MRI but experienced a flare-up of her chronic pain after completing one of the three planned studies. She was admitted to the [facility] emergency department for intractable acute on chronic pain.  Upon evaluation, [name] reported uncontrolled pain, rating her pain as 10/10. She noted that oral oxycodone 10 mg was ineffective, and the night team had not administered IV hydromorphone, which has helped her in the past. She felt she had fallen behind on pain control, leading to worsening symptoms today.    In previous hospitalizations, [name] had received ketamine infusions (5/24, 6/6, 8/1) and a lidocaine infusion (09/28/2022). However, there was inconsistency in reporting the effectiveness of these treatments, making it difficult to assess their true benefit.     I contacted [name]'s primary pain provider, [name], who recommended the following treatment plan:  Continue Butrans patch at 20 mcg/h.  Increase oxycodone liquid to 15 mg every 6 hours, or up to every 4 hours as needed for pain.  Gradually wean off IV hydromorphone.  Avoid ketamine infusion at this time.  This plan was discussed with [name], who agreed to the recommendations. The primary admitting team was present during the discussion.    We have also advised [name] to discuss with [name] to create an ED/Admission care plan to streamline her care, upon her presentation to the hospital in the future. To which Shamonte and her mother agreeable to.     Interval: Patient states she is doing about the same as yesterday. She states that she anticipates her pouchoscopy prep to be painful and request more pain coverage. After speaking with [name] over the phone - we decided to increase oxycodone liquid to 20 mg q4 hrs prn but do not plan on escalating the IV opioid at all. Patient agreeable to that. Patient requests to keep IV HM same but have an additional dose of iv HM available in case she has severe pain.  Recommendations:  -The chronic pain service is a consult service and does not place orders, just makes recommendations (except ketamine and lidocaine infusions)  -Please evaluate all patients on opioids for appropriateness of prescribing narcan at discharge.  The chronic pain service can assist with this.  Nasal narcan covered by most insurance.  -Recommendations given apply to the current hospitalization and do not reflect long term recommendations.    Recommended to   -Continue Butrans patch 20 mcg/h q. Weekly  -increase oxycodone liquid 20 mg every 4 hours as needed temporarily during bowel prep and procedure  -Continue IV hydromorphone 1 mg every 4 hours as needed, do not escalate for bowel prep  --Continue Tylenol 1 g 3 times daily  -during bowel prep if she has severe pain flare not relieved with above regimen, patient could get ADDITIONAL ONE TIME dose of 1 mg IV hydromorphone PRN .  -Hold movantik (due to diarrhea)    We will continue to follow.    Medications trialed during this admission (or pertinent in past): Hydromorphone IV, oxycodone oral, Butrans patch    Please make sure that patient has prescription of naloxone at discharge.  Nasal narcan for most insured (Nasal narcan 4mg /actuation, prescribe 1 kit, instructions at [email or web address]).  For uninsured, chronic pain can work to assist in finding an option.  OTC nasal narcan now available at most pharmacies for around $45.    History of Present Illness:    Reason for Consult: Acute on chronic abdominal pain    Overall pain is still similar to yesterday, still severe at times.  She is worried about the bowel prep and pouchoscopy.    Analgesia Evaluation:  Pain at minimum: 5/10  Pain at maximum: 9/10    Prior to admission, the patient was on home opiate medications.  Shehas been followed by a pain clinic.  The [place] CSRS was reviewed.    Allergies    Allergies   Allergen Reactions    Adhesive Tape-Silicones Hives and Rash     Paper tape and Tegederm ok. Biopatch causes blistering but has tolerated CHG Tegaderm    Ferrlecit [Sodium Ferric Gluconat-Sucrose] Swelling and Rash    Levofloxacin Swelling and Rash     Swelling in mouth, rash,     Methylnaltrexone      Per Patient: I lost bowel control, severe abdominal cramping, and elevated BP    Neomycin Swelling     Rxn after ear drops; ear swelling    Papaya Hives    Morphine Nausea And Vomiting    Zosyn [Piperacillin-Tazobactam] Itching and Rash     Red and itchy    Compazine [Prochlorperazine] Other (See Comments)     Extreme agitation    Iron Analogues     Iron Dextran Itching     Received iron dextran 06/08/22 over 12 hours, had itching and redness/flushing during the infusion and for a couple days after. Required IV benadryl w/flares between doses and ultimately treated w/IV methylpred for 2 days.     Latex, Natural Rubber Rash         Inpatient Medications  Current Facility-Administered Medications   Medication Dose Route Frequency Provider Last Rate Last Admin    acetaminophen (TYLENOL) tablet 1,000 mg  1,000 mg Oral Q8H SCH Gogerly-Moragoda, Ellene Route, MD   1,000 mg at 03/12/23 1307    buprenorphine 20 mcg/hour transdermal patch 1 patch  1 patch Transdermal Q7 Days Emogene Morgan, MD   1 patch at 03/11/23 1308  cefdinir (OMNICEF) capsule 300 mg  300 mg Oral Q12H SCH Garnet Sierras, MD   300 mg at 03/12/23 1610    cetirizine (ZYRTEC) tablet 20 mg  20 mg Oral BID Garnet Sierras, MD   20 mg at 03/12/23 9604    CHEMO CLARIFICATION ORDER   Other Continuous PRN Hewitt Blade, MD        emollient combination no.92 (LUBRIDERM) lotion   Topical Q1H PRN Emogene Morgan, MD   Given at 03/11/23 1447    escitalopram oxalate (LEXAPRO) tablet 5 mg  5 mg Oral Daily Melvia Heaps D, MD   5 mg at 03/12/23 5409    famotidine (PEPCID) tablet 20 mg  20 mg Oral Nightly Garnet Sierras, MD   20 mg at 03/11/23 2226    fluticasone propionate (FLONASE) 50 mcg/actuation nasal spray 2 spray  2 spray Topical Q7 Days Emogene Morgan, MD   2 spray at 03/09/23 0249    hydrocortisone 2.5 % cream   Topical BID PRN Melvia Heaps D, MD        HYDROmorphone (PF) (DILAUDID) injection Syrg 0.5 mg  0.5 mg Intravenous Q4H PRN Garnet Sierras, MD   0.5 mg at 03/10/23 1346    Or    HYDROmorphone (PF) (DILAUDID) injection 1 mg  1 mg Intravenous Q4H PRN Garnet Sierras, MD   1 mg at 03/12/23 1130    IP OKAY TO TREAT   Other Continuous PRN Hewitt Blade, MD        lidocaine (ASPERCREME) 4 % 1 patch  1 patch Transdermal Daily Garnet Sierras, MD   1 patch at 03/11/23 1250    LORazepam (ATIVAN) tablet 1 mg  1 mg Oral TID PRN Emogene Morgan, MD        mirtazapine (REMERON) tablet 7.5 mg  7.5 mg Oral Nightly Gogerly-Moragoda, Ellene Route, MD   7.5 mg at 03/10/23 2126    [Provider Hold] naloxegol (MOVANTIK) tablet 12.5 mg  12.5 mg Oral Daily Melvia Heaps D, MD   12.5 mg at 03/10/23 0836    naloxone (NARCAN) injection 0.4 mg  0.4 mg Intravenous Once PRN Garnet Sierras, MD        nirogacestat (OGSIVEO) tablet 150 mg **PATIENT SUPPLIED**  150 mg Oral BID Hewitt Blade, MD   150 mg at 03/12/23 0848    ondansetron (ZOFRAN) injection 8 mg  8 mg Intravenous Q8H PRN Garnet Sierras, MD   8 mg at 03/12/23 1131    oxyCODONE (ROXICODONE) 5 mg/5 mL solution 20 mg  20 mg Oral Q4H PRN Gogerly-Moragoda, Ellene Route, MD        pantoprazole (Protonix) EC tablet 40 mg  40 mg Oral Daily Gogerly-Moragoda, Ellene Route, MD   40 mg at 03/12/23 1130    promethazine (PHENERGAN) 12.5 mg in sodium chloride (NS) 0.9 % 50 mL IVPB  12.5 mg Intravenous Q8H PRN Gogerly-Moragoda, Ellene Route, MD        saliva stimulant mucosal spray (BIOTENE)  1 spray Topical Q2H PRN Garnet Sierras, MD   1 spray at 03/12/23 1309    scopolamine (TRANSDERM-SCOP) 1 mg over 3 days topical patch 1 mg  1 patch Topical Q72H Feagins, Kayla D, MD   1 mg at 03/11/23 1306    sodium chloride (NS) 0.9 % flush 10 mL  10 mL Intravenous BID Melvia Heaps D, MD   10 mL at 03/12/23 0839    sodium chloride (NS) 0.9 %  flush 10 mL  10 mL Intravenous BID Melvia Heaps D, MD   10 mL at 03/12/23 0839    sodium chloride (NS) 0.9 % flush 10 mL  10 mL Intravenous BID Melvia Heaps D, MD   10 mL at 03/12/23 0839    sodium chloride (NS) 0.9 % infusion  20 mL/hr Intravenous Continuous Emogene Morgan, MD   Held at 03/09/23 0250    sodium chloride (NS) 0.9 % infusion  100 mL/hr Intravenous Continuous Garnet Sierras, MD 100 mL/hr at 03/12/23 1409 100 mL/hr at 03/12/23 1409    sucralfate (CARAFATE) oral suspension  1 g Oral QID Melvia Heaps D, MD   1 g at 03/11/23 1446    triamcinolone (KENALOG) 0.1 % ointment   Topical BID PRN Emogene Morgan, MD   Given at 03/11/23 1326           Lab Results   Component Value Date    CREATININE 0.63 03/12/2023       Lab Results   Component Value Date    ALKPHOS 74 03/12/2023    BILITOT 0.2 (L) 03/12/2023    BILIDIR 0.10 10/29/2022    PROT 6.3 03/12/2023    ALBUMIN 3.3 (L) 03/12/2023    ALT 9 (L) 03/12/2023    AST 13 03/12/2023       Encounter Date: 03/08/23   ECG 12 Lead   Result Value    EKG Systolic BP     EKG Diastolic BP     EKG Ventricular Rate 82    EKG Atrial Rate 82    EKG P-R Interval 142    EKG QRS Duration 88    EKG Q-T Interval 382    EKG QTC Calculation 446    EKG Calculated P Axis 53    EKG Calculated R Axis 39    EKG Calculated T Axis 20    QTC Fredericia 424    Narrative    NORMAL SINUS RHYTHM  NONSPECIFIC T WAVE ABNORMALITY  ABNORMAL ECG  WHEN COMPARED WITH ECG OF 02-Nov-2022 08:33,  NONSPECIFIC T WAVE ABNORMALITY NOW EVIDENT IN ANTERIOR LEADS  Confirmed by Pollyann Kennedy ([id number]) on 03/09/2023 7:31:09 PM       No results found for requested labs within last 30 days.       Objective:     Vital Signs  Temp:  [36.7 ??C (98.1 ??F)-36.8 ??C (98.2 ??F)] 36.8 ??C (98.2 ??F)  Heart Rate:  [58-74] 67  Resp:  [17-18] 17  BP: (107-121)/(63-74) 121/74  MAP (mmHg):  [77-89] 89  SpO2:  [92 %-99 %] 98 %    Physical Exam    GENERAL:  Well developed, well-nourished female and is in no apparent distress.   HEAD/NECK:    Reveals normocephalic/atraumatic. CARDIOVASCULAR:   Regular rate  LUNGS:   Normal work of breathing, no supplemental 02  EXTREMITIES:  Warm, no clubbing, cyanosis, or edema was noted.  NEUROLOGIC:    The patient was alert and oriented times four with normal language, attention, cognition and memory. Cranial nerve exam was grossly normal.  MUSCULOSKELETAL:    Moving all 4 extremities.    SKIN:  No obvious rashes lesions or erythema  PSY:  Appropriate affect and mood.      Problem List    Principal Problem:    Chronic pain due to neoplasm  Active Problems:    [name]    Generalized anxiety disorder with panic attacks    GI bleeding  MDD (major depressive disorder)    Dizziness    Fatigue       Medical Decision Making    Problems Addressed:  1 or more chronic illnesses with severe exacerbation, progression, or side effects of treatment (high) pain still severe at times, up to 9/10    Amount/Complexity:  Reviewed external records:  oncology 03/12/23, GI 03/12/23  Reviewed results:  Labs 03/12/23-Cr, LFTs  Ordered tests: None  Assessment requiring independent historian: None  Discussion of management/test results with medical professional: Primary team  Independent interpretation of test: None    Risk  High: Drug therapy requiring intensive monitoring for toxicity, Decision regarding hospitalization, Elective Major Surgery w/ risk factors, Emergency Major Surgery, Decision to DNR or de-escalate care because of poor prognosis, parental controlled substances- IV opioids      I was immediately available during all portions of the patient's visit and discussed the assessment and plan with the fellow.  I reviewed the chart and the fellow's note and agree with the findings and plan. Criss Rosales, MD

## 2023-03-12 NOTE — Unmapped (Signed)
Pt A+Ox4. Afebrile, VSS. Pt continues to have uncontrolled pain. PRN oxy given x2 and PRN dilaudid given x2 with minimal effect. PRN zofran and phenergan given for nausea. Mother at bedside throughout shift. No acute events/injuries.    Problem: Adult Inpatient Plan of Care  Goal: Plan of Care Review  Outcome: Ongoing - Unchanged  Goal: Patient-Specific Goal (Individualized)  Outcome: Ongoing - Unchanged  Goal: Absence of Hospital-Acquired Illness or Injury  Outcome: Ongoing - Unchanged  Intervention: Identify and Manage Fall Risk  Recent Flowsheet Documentation  Taken 03/11/2023 0800 by Jomarie Longs, RN  Safety Interventions:   environmental modification   fall reduction program maintained   family at bedside   infection management   lighting adjusted for tasks/safety   low bed   nonskid shoes/slippers when out of bed  Intervention: Prevent Infection  Recent Flowsheet Documentation  Taken 03/11/2023 0800 by Jomarie Longs, RN  Infection Prevention:   cohorting utilized   environmental surveillance performed   equipment surfaces disinfected   hand hygiene promoted   personal protective equipment utilized   rest/sleep promoted   single patient room provided  Goal: Optimal Comfort and Wellbeing  Outcome: Ongoing - Unchanged  Goal: Readiness for Transition of Care  Outcome: Ongoing - Unchanged  Goal: Rounds/Family Conference  Outcome: Ongoing - Unchanged     Problem: Pain Acute  Goal: Optimal Pain Control and Function  Outcome: Ongoing - Unchanged     Problem: Latex Allergy  Goal: Absence of Allergy Symptoms  Outcome: Ongoing - Unchanged     Problem: Oncology Care  Goal: Effective Coping  Outcome: Ongoing - Unchanged  Goal: Improved Activity Tolerance  Outcome: Ongoing - Unchanged  Goal: Optimal Oral Intake  Outcome: Ongoing - Unchanged  Goal: Improved Oral Mucous Membrane Integrity  Outcome: Ongoing - Unchanged  Goal: Optimal Pain Control and Function  Outcome: Ongoing - Unchanged     Problem: Infection  Goal: Absence of Infection Signs and Symptoms  Outcome: Ongoing - Unchanged  Intervention: Prevent or Manage Infection  Recent Flowsheet Documentation  Taken 03/11/2023 0800 by Jomarie Longs, RN  Infection Management: aseptic technique maintained     Problem: Self-Care Deficit  Goal: Improved Ability to Complete Activities of Daily Living  Outcome: Ongoing - Unchanged     Problem: Malnutrition  Goal: Improved Nutritional Intake  Outcome: Ongoing - Unchanged     Problem: Fall Injury Risk  Goal: Absence of Fall and Fall-Related Injury  Outcome: Ongoing - Unchanged  Intervention: Promote Injury-Free Environment  Recent Flowsheet Documentation  Taken 03/11/2023 0800 by Jomarie Longs, RN  Safety Interventions:   environmental modification   fall reduction program maintained   family at bedside   infection management   lighting adjusted for tasks/safety   low bed   nonskid shoes/slippers when out of bed

## 2023-03-13 LAB — COMPREHENSIVE METABOLIC PANEL
ALBUMIN: 3.4 g/dL (ref 3.4–5.0)
ALKALINE PHOSPHATASE: 68 U/L (ref 46–116)
ALT (SGPT): 12 U/L (ref 10–49)
ANION GAP: 8 mmol/L (ref 5–14)
AST (SGOT): 18 U/L (ref <=34)
BILIRUBIN TOTAL: 0.3 mg/dL (ref 0.3–1.2)
BLOOD UREA NITROGEN: <5 mg/dL — ABNORMAL LOW (ref 9–23)
CALCIUM: 9.3 mg/dL (ref 8.7–10.4)
CHLORIDE: 106 mmol/L (ref 98–107)
CO2: 28 mmol/L (ref 20.0–31.0)
CREATININE: 0.6 mg/dL (ref 0.55–1.02)
EGFR CKD-EPI (2021) FEMALE: >90 mL/min/{1.73_m2} (ref >=60)
GLUCOSE RANDOM: 88 mg/dL (ref 70–179)
POTASSIUM: 3.7 mmol/L (ref 3.4–4.8)
PROTEIN TOTAL: 6.3 g/dL (ref 5.7–8.2)
SODIUM: 142 mmol/L (ref 135–145)

## 2023-03-13 LAB — CBC W/ AUTO DIFF
BASOPHILS ABSOLUTE COUNT: 0 10*9/L (ref 0.0–0.1)
BASOPHILS RELATIVE PERCENT: 0.6 %
EOSINOPHILS ABSOLUTE COUNT: 0.4 10*9/L (ref 0.0–0.5)
EOSINOPHILS RELATIVE PERCENT: 5.2 %
HEMATOCRIT: 36.3 % (ref 34.0–44.0)
HEMOGLOBIN: 11.9 g/dL (ref 11.3–14.9)
LYMPHOCYTES ABSOLUTE COUNT: 2.5 10*9/L (ref 1.1–3.6)
LYMPHOCYTES RELATIVE PERCENT: 35.5 %
MEAN CORPUSCULAR HEMOGLOBIN CONC: 32.9 g/dL (ref 32.0–36.0)
MEAN CORPUSCULAR HEMOGLOBIN: 26.1 pg (ref 25.9–32.4)
MEAN CORPUSCULAR VOLUME: 79.4 fL (ref 77.6–95.7)
MEAN PLATELET VOLUME: 10.3 fL (ref 6.8–10.7)
MONOCYTES ABSOLUTE COUNT: 0.7 10*9/L (ref 0.3–0.8)
MONOCYTES RELATIVE PERCENT: 9.8 %
NEUTROPHILS ABSOLUTE COUNT: 3.4 10*9/L (ref 1.8–7.8)
NEUTROPHILS RELATIVE PERCENT: 48.9 %
PLATELET COUNT: 224 10*9/L (ref 150–450)
RED BLOOD CELL COUNT: 4.57 10*12/L (ref 3.95–5.13)
RED CELL DISTRIBUTION WIDTH: 14.7 % (ref 12.2–15.2)
WBC ADJUSTED: 7 10*9/L (ref 3.6–11.2)

## 2023-03-13 LAB — MAGNESIUM: MAGNESIUM: 2.2 mg/dL (ref 1.6–2.6)

## 2023-03-13 LAB — PHOSPHORUS: PHOSPHORUS: 3 mg/dL (ref 2.4–5.1)

## 2023-03-13 MED ADMIN — acetaminophen (TYLENOL) tablet 1,000 mg: 1000 mg | ORAL | @ 03:00:00

## 2023-03-13 MED ADMIN — cetirizine (ZYRTEC) tablet 20 mg: 20 mg | ORAL | @ 18:00:00

## 2023-03-13 MED ADMIN — cetirizine (ZYRTEC) tablet 20 mg: 20 mg | ORAL | @ 01:00:00

## 2023-03-13 MED ADMIN — fentaNYL (PF) (SUBLIMAZE) injection 50 mcg: 50 ug | INTRAVENOUS | @ 14:00:00 | Stop: 2023-03-13

## 2023-03-13 MED ADMIN — ondansetron (ZOFRAN) injection 8 mg: 8 mg | INTRAVENOUS | @ 06:00:00

## 2023-03-13 MED ADMIN — oxyCODONE (ROXICODONE) 5 mg/5 mL solution 20 mg: 20 mg | ORAL | @ 03:00:00 | Stop: 2023-03-17

## 2023-03-13 MED ADMIN — cefdinir (OMNICEF) capsule 300 mg: 300 mg | ORAL | @ 03:00:00 | Stop: 2023-03-24

## 2023-03-13 MED ADMIN — HYDROmorphone (PF) (DILAUDID) injection 1 mg: 1 mg | INTRAVENOUS | @ 22:00:00 | Stop: 2023-03-24

## 2023-03-13 MED ADMIN — sodium chloride (NS) 0.9 % infusion: INTRAVENOUS | @ 13:00:00 | Stop: 2023-03-13

## 2023-03-13 MED ADMIN — HYDROmorphone (PF) (DILAUDID) injection 1 mg: 1 mg | INTRAVENOUS | @ 01:00:00 | Stop: 2023-03-24

## 2023-03-13 MED ADMIN — Propofol (DIPRIVAN) injection: INTRAVENOUS | @ 13:00:00 | Stop: 2023-03-13

## 2023-03-13 MED ADMIN — ePHEDrine (PF) 25 mg/5 mL (5 mg/mL) in 0.9% sodium chloride syringe Syrg: INTRAVENOUS | @ 13:00:00 | Stop: 2023-03-13

## 2023-03-13 MED ADMIN — sodium chloride (NS) 0.9 % flush 10 mL: 10 mL | INTRAVENOUS | @ 01:00:00

## 2023-03-13 MED ADMIN — HYDROmorphone (PF) (DILAUDID) injection 1 mg: 1 mg | INTRAVENOUS | @ 06:00:00 | Stop: 2023-03-24

## 2023-03-13 MED ADMIN — nirogacestat (OGSIVEO) tablet 150 mg: 150 mg | ORAL | @ 18:00:00

## 2023-03-13 MED ADMIN — meperidine (DEMEROL) injection: INTRAVENOUS | @ 14:00:00 | Stop: 2023-03-13

## 2023-03-13 MED ADMIN — ePHEDrine (PF) 25 mg/5 mL (5 mg/mL) in 0.9% sodium chloride syringe Syrg: INTRAVENOUS | @ 14:00:00 | Stop: 2023-03-13

## 2023-03-13 MED ADMIN — lidocaine (PF) (XYLOCAINE-MPF) 20 mg/mL (2 %) injection: INTRAVENOUS | @ 13:00:00 | Stop: 2023-03-13

## 2023-03-13 MED ADMIN — acetaminophen (TYLENOL) tablet 1,000 mg: 1000 mg | ORAL | @ 12:00:00

## 2023-03-13 MED ADMIN — HYDROmorphone (PF) (DILAUDID) injection 1 mg: 1 mg | INTRAVENOUS | @ 16:00:00 | Stop: 2023-03-13

## 2023-03-13 MED ADMIN — oxyCODONE (ROXICODONE) 5 mg/5 mL solution 20 mg: 20 mg | ORAL | @ 19:00:00 | Stop: 2023-03-17

## 2023-03-13 MED ADMIN — fentaNYL (PF) (SUBLIMAZE) injection: INTRAVENOUS | @ 13:00:00 | Stop: 2023-03-13

## 2023-03-13 MED ADMIN — promethazine (PHENERGAN) 12.5 mg in sodium chloride (NS) 0.9 % 50 mL IVPB: 12.5 mg | INTRAVENOUS | @ 17:00:00

## 2023-03-13 MED ADMIN — propofol (DIPRIVAN) infusion 10 mg/mL: INTRAVENOUS | @ 13:00:00 | Stop: 2023-03-13

## 2023-03-13 MED ADMIN — escitalopram oxalate (LEXAPRO) tablet 5 mg: 5 mg | ORAL | @ 18:00:00

## 2023-03-13 MED ADMIN — HYDROmorphone (PF) (DILAUDID) injection 1 mg: 1 mg | INTRAVENOUS | @ 12:00:00 | Stop: 2023-03-24

## 2023-03-13 MED ADMIN — acetaminophen (TYLENOL) tablet 1,000 mg: 1000 mg | ORAL | @ 19:00:00

## 2023-03-13 MED ADMIN — triamcinolone (KENALOG) 0.1 % ointment: TOPICAL | @ 06:00:00

## 2023-03-13 MED ADMIN — famotidine (PEPCID) tablet 20 mg: 20 mg | ORAL | @ 03:00:00

## 2023-03-13 MED ADMIN — cefdinir (OMNICEF) capsule 300 mg: 300 mg | ORAL | @ 18:00:00 | Stop: 2023-03-24

## 2023-03-13 MED ADMIN — ondansetron (ZOFRAN) injection 8 mg: 8 mg | INTRAVENOUS | @ 22:00:00

## 2023-03-13 MED ADMIN — HYDROmorphone (PF) (DILAUDID) injection 1 mg: 1 mg | INTRAVENOUS | @ 18:00:00 | Stop: 2023-03-24

## 2023-03-13 MED ADMIN — ondansetron (ZOFRAN) injection 8 mg: 8 mg | INTRAVENOUS | @ 12:00:00

## 2023-03-13 MED ADMIN — nirogacestat (OGSIVEO) tablet 150 mg **PATIENT SUPPLIED**: 150 mg | ORAL | @ 01:00:00

## 2023-03-13 NOTE — Unmapped (Signed)
Social Work  Psychosocial Assessment    Patient Name: [name] [name]   Medical Record Number: [id number]   Date of Birth: 2000/05/19  Sex: Female     SW brief:    SW met with Pt and mom at bedside to follow up on SW consult. Full PSA not warranted. Pt had questions regarding status of [place] FA and PAP. SW informed that on our system application is marked as pending as of 02/10/23. SW provided Pt with phone number to [facility] FA and PAP to follow up as they will be able to give Pt accurate information. Pt discussed concerns with high out of pocket cost. No other financial concerns, Pt and mom declined SDOH concerns.    No additional SW needs. SW to sign off at this time. Please re-consult if other needs come up.  Referral  Referred by: Care Manager  Reason for Referral: Financial / Insurance Concerns    Extended Emergency Contact Information  Primary Emergency Contact: Decoursey,[name]   [place]  Home Phone: [phone]  Mobile Phone: [phone]  Relation: Mother  Secondary Emergency Contact: Sachse,Jeff  Mobile Phone: [phone]  Relation: Father    Legal Next of Kin / Guardian / POA / Advance Directives    HCDM [facility] policy, no known patient preference): Mroczka,[name] - Mother - [phone]    HCDM, back-up (If primary HCDM is unavailable): Chrishauna, Bernheisel - Father - [phone]    Advance Directive (Medical Treatment)  Does patient have an advance directive covering medical treatment?: Patient does not have advance directive covering medical treatment.  Reason patient does not have an advance directive covering medical treatment:: Patient does not wish to complete one at this time.    Health Care Decision Maker [HCDM] (Medical & Mental Health Treatment)  Healthcare Decision Maker: HCDM documented in the HCDM/Contact Info section.  Information offered on HCDM, Medical & Mental Health advance directives:: Patient given information.    Advance Directive (Mental Health Treatment)  Does patient have an advance directive covering mental health treatment?: Patient does not have advance directive covering mental health treatment.  Reason patient does not have an advance directive covering mental health treatment:: Patient does not wish to complete one at this time.    Discharge Planning  Discharge Planning Information:   Type of Residence   Mailing Address:  [address]  [place] [address]    Medical Information   Past Medical History:   Diagnosis Date    Abdominal pain     Acid reflux     occas    Anesthesia complication     itching, shaking, coldness; last few surgeries have gone much better    Cancer (CMS-HCC)     Cataract of right eye     COVID-19 virus infection 01/2019    Cyst of thyroid determined by ultrasound     monitoring    [name]     2 right forearm, 1 left thigh, 1 right scapula, 1 under left clavicle; multiple    Difficult intravenous access     [name] (familial adenomatous polyposis)     [name]     Gastric polyps     History of chemotherapy     last treatment approx 05/2019    History of colon polyps     History of COVID-19 01/2019    Ileus (CMS-HCC) 03/16/2022    Iron deficiency anemia due to chronic blood loss     received iron infusion 11-2019    PONV (  postoperative nausea and vomiting)     Rectal bleeding     Syncopal episodes     especially if becoming dehydrated       Past Surgical History:   Procedure Laterality Date    COLON SURGERY      cyroablation      cystis removal      desmoid removal      PR CLOSE ENTEROSTOMY,RESEC+ANAST N/A 10/09/2020    Procedure: ILEOSTOMY TAKEDOWN;  Surgeon: Mickle Asper, MD;  Location: OR [place];  Service: General Surgery    PR COLONOSCOPY W/BIOPSY SINGLE/MULTIPLE N/A 10/27/2012    Procedure: COLONOSCOPY, FLEXIBLE, PROXIMAL TO SPLENIC FLEXURE; WITH BIOPSY, SINGLE OR MULTIPLE;  Surgeon: Shirlyn Goltz Mir, MD;  Location: PEDS PROCEDURE ROOM [facility];  Service: Gastroenterology    PR COLONOSCOPY W/BIOPSY SINGLE/MULTIPLE N/A 09/14/2013    Procedure: COLONOSCOPY, FLEXIBLE, PROXIMAL TO SPLENIC FLEXURE; WITH BIOPSY, SINGLE OR MULTIPLE;  Surgeon: Shirlyn Goltz Mir, MD;  Location: PEDS PROCEDURE ROOM [place];  Service: Gastroenterology    PR COLONOSCOPY W/BIOPSY SINGLE/MULTIPLE N/A 11/08/2014    Procedure: COLONOSCOPY, FLEXIBLE, PROXIMAL TO SPLENIC FLEXURE; WITH BIOPSY, SINGLE OR MULTIPLE;  Surgeon: Arnold Long Mir, MD;  Location: PEDS PROCEDURE ROOM [facility];  Service: Gastroenterology    PR COLONOSCOPY W/BIOPSY SINGLE/MULTIPLE N/A 12/26/2015    Procedure: COLONOSCOPY, FLEXIBLE, PROXIMAL TO SPLENIC FLEXURE; WITH BIOPSY, SINGLE OR MULTIPLE;  Surgeon: Arnold Long Mir, MD;  Location: PEDS PROCEDURE ROOM [place];  Service: Gastroenterology    PR COLONOSCOPY W/BIOPSY SINGLE/MULTIPLE N/A 09/02/2017    Procedure: COLONOSCOPY, FLEXIBLE, PROXIMAL TO SPLENIC FLEXURE; WITH BIOPSY, SINGLE OR MULTIPLE;  Surgeon: Arnold Long Mir, MD;  Location: PEDS PROCEDURE ROOM [place];  Service: Gastroenterology    PR COLSC FLX W/REMOVAL LESION BY HOT BX FORCEPS N/A 08/27/2016    Procedure: COLONOSCOPY, FLEXIBLE, PROXIMAL TO SPLENIC FLEXURE; W/REMOVAL TUMOR/POLYP/OTHER LESION, HOT BX FORCEP/CAUTE;  Surgeon: Arnold Long Mir, MD;  Location: PEDS PROCEDURE ROOM [facility];  Service: Gastroenterology    PR COLSC FLX W/RMVL OF TUMOR POLYP LESION SNARE TQ N/A 02/25/2019    Procedure: COLONOSCOPY FLEX; W/REMOV TUMOR/LES BY SNARE;  Surgeon: Helyn Numbers, MD;  [facility]: [facility] [facility];  Service: Gastroenterology    PR COLSC FLX W/RMVL OF TUMOR POLYP LESION SNARE TQ N/A 03/13/2020    Procedure: COLONOSCOPY FLEX; W/REMOV TUMOR/LES BY SNARE;  Surgeon: Helyn Numbers, MD;  [facility]: [facility];  Service: Gastroenterology    PR EXC SKIN BENIG 2.1-3 CM TRUNK,ARM,LEG Right 02/25/2017    Procedure: EXCISION, BENIGN LESION INCLUDE MARGINS, EXCEPT SKIN TAG, LEGS; EXCISED DIAMETER 2.1 TO 3.0 CM;  Surgeon: Clarene Duke, MD;  Location: CHILDRENS OR [facility]; Service: Plastics    PR EXC SKIN BENIG 3.1-4 CM TRUNK,ARM,LEG Right 02/25/2017    Procedure: EXCISION, BENIGN LESION INCLUDE MARGINS, EXCEPT SKIN TAG, ARMS; EXCISED DIAMETER 3.1 TO 4.0 CM;  Surgeon: Clarene Duke, MD;  Location: CHILDRENS OR [facility];  Service: Plastics    PR EXC SKIN BENIG >4 CM FACE,FACIAL Right 02/25/2017    Procedure: EXCISION, OTHER BENIGN LES INCLUD MARGIN, FACE/EARS/EYELIDS/NOSE/LIPS/MUCOUS MEMBRANE; EXCISED DIAM >4.0 CM;  Surgeon: Clarene Duke, MD;  Location: CHILDRENS OR [facility];  Service: Plastics    PR EXC TUMOR SOFT TISSUE LEG/ANKLE SUBQ 3+CM Right 08/05/2019    Procedure: EXCISION, TUMOR, SOFT TISSUE OF LEG OR ANKLE AREA, SUBCUTANEOUS; 3 CM OR GREATER;  Surgeon: Arsenio Katz, MD;  Location: MAIN OR [place];  Service: Plastics    PR EXC TUMOR SOFT TISSUE LEG/ANKLE SUBQ <3CM  Right 08/05/2019    Procedure: EXCISION, TUMOR, SOFT TISSUE OF LEG OR ANKLE AREA, SUBCUTANEOUS; LESS THAN 3 CM;  Surgeon: Arsenio Katz, MD;  Location: MAIN OR [facility];  Service: Plastics    PR LAP, SURG PROCTECTOMY W J-POUCH N/A 08/10/2020    Procedure: ROBOTIC ASSISTED LAPAROSCOPIC PROCTOCOLECTOMY, ILEAL J POUCH, WITH OSTOMY;  Surgeon: Mickle Asper, MD;  Location: OR [place];  Service: General Surgery    PR NDSC EVAL INTSTINAL POUCH DX W/COLLJ SPEC SPX N/A 01/23/2021    Procedure: ENDO EVAL SM INTEST POUCH; DX;  Surgeon: Modena Nunnery, MD;  [facility];  Service: Gastroenterology    PR NDSC EVAL INTSTINAL POUCH DX W/COLLJ SPEC SPX N/A 08/27/2021    Procedure: ENDO EVAL SM INTEST POUCH; DX;  Surgeon: Hunt Oris, MD;  [facility];  Service: Gastroenterology    PR NDSC EVAL INTSTINAL POUCH DX W/COLLJ SPEC SPX N/A 12/09/2021    Procedure: ENDO EVAL SM INTEST POUCH; DX;  Surgeon: Vidal Schwalbe, MD;  Location: [facility] PROC[facility]URES [facility];  Service: Gastroenterology    PR NDSC EVAL INTSTINAL POUCH DX W/COLLJ [facility] SPX Left 04/09/2022    Procedure: ENDO EVAL SM INTEST POUCH; DX;  Surgeon: Modena Nunnery, MD;  [facility] [facility];  Service: Gastroenterology    PR NDSC EVAL INTSTINAL POUCH DX W/COLLJ SPEC SPX N/A 08/05/2022    Procedure: ENDO EVAL SM INTEST POUCH; DX;  Surgeon: Modena Nunnery, MD;  Location: GI PROCEDURES [facility];  Service: Gastroenterology    PR NDSC EVAL INTSTINAL POUCH W/BX SINGLE/MULTIPLE N/A 01/20/2022    Procedure: ENDOSCOPIC EVAL OF SMALL INTESTINAL POUCH; DIAGNOSTIC, No biopsies;  Surgeon: Andrey Farmer, MD;  Location: GI PROCEDURES [facility];  Service: Gastroenterology    PR NDSC EVAL INTSTINAL POUCH W/BX SINGLE/MULTIPLE N/A 02/13/2022    Procedure: ENDOSCOPIC EVAL OF SMALL INTESTINAL POUCH; DIAGNOSTIC, WITH BIOPSY;  Surgeon: Bronson Curb, MD;  Location: GI PROCEDURES [facility];  Service: Gastroenterology    PR UNLISTED PROCEDURE SMALL INTESTINE  01/23/2021    Procedure: UNLISTED PROCEDURE, SMALL INTESTINE;  Surgeon: Modena Nunnery, MD;  [facility]: [facility] [facility];  Service: Gastroenterology    PR UNLISTED PROCEDURE SMALL INTESTINE  02/13/2022    Procedure: UNLISTED PROCEDURE, SMALL INTESTINE;  Surgeon: Bronson Curb, MD;  Location: GI PROCEDURES [facility];  Service: Gastroenterology    PR UPPER GI ENDOSCOPY,BIOPSY N/A 10/27/2012    Procedure: UGI ENDOSCOPY; WITH BIOPSY, SINGLE OR MULTIPLE;  Surgeon: Shirlyn Goltz Mir, MD;  Location: PEDS PROCEDURE ROOM [facility];  Service: Gastroenterology    PR UPPER GI ENDOSCOPY,BIOPSY N/A 09/14/2013    Procedure: UGI ENDOSCOPY; WITH BIOPSY, SINGLE OR MULTIPLE;  Surgeon: Shirlyn Goltz Mir, MD;  Location: PEDS PROCEDURE ROOM [facility];  Service: Gastroenterology    PR UPPER GI ENDOSCOPY,BIOPSY N/A 11/08/2014    Procedure: UGI ENDOSCOPY; WITH BIOPSY, SINGLE OR MULTIPLE;  Surgeon: Arnold Long Mir, MD;  Location: PEDS PROCEDURE ROOM [facility];  Service: Gastroenterology    PR UPPER GI ENDOSCOPY,BIOPSY N/A 12/26/2015    Procedure: UGI ENDOSCOPY; WITH BIOPSY, SINGLE OR MULTIPLE;  Surgeon: Arnold Long Mir, MD;  Location: PEDS PROCEDURE ROOM [facility];  Service: Gastroenterology    PR UPPER GI ENDOSCOPY,BIOPSY N/A 08/27/2016    Procedure: UGI ENDOSCOPY; WITH BIOPSY, SINGLE OR MULTIPLE;  Surgeon: Arnold Long Mir, MD;  Location: PEDS PROCEDURE RO[facility];  Service: Gastroenterology    PR UPPER GI ENDOSCOPY,BIOPSY N/A 09/02/2017  Procedure: UGI ENDOSCOPY; WITH BIOPSY, SINGLE OR MULTIPLE;  Surgeon: Arnold Long Mir, MD;  Location: PEDS PROCEDURE ROOM [facility];  Service: Gastroenterology    PR UPPER GI ENDOSCOPY,BIOPSY N/A 03/13/2020    Procedure: UGI ENDOSCOPY; WITH BIOPSY, SINGLE OR MULTIPLE;  Surgeon: Helyn Numbers, MD;  Location: [facility] [place] [facility];  Service: Gastroenterology    PR UPPER GI ENDOSCOPY,BIOPSY N/A 09/05/2021    Procedure: UGI ENDOSCOPY; WITH BIOPSY, SINGLE OR MULTIPLE;  Surgeon: Wendall Papa, MD;  Location: GI PROC[facility]URES [facility];  Service: Gastroenterology    PR UPPER GI ENDOSCOPY,DIAGNOSIS N/A 01/20/2022    Procedure: UGI ENDO, INCLUDE ESOPHAGUS, STOMACH, & DUODENUM &/OR JEJUNUM; DX W/WO COLLECTION SPECIMN, BY BRUSH OR WASH;  Surgeon: Andrey Farmer, MD;  Location: GI PROCEDURES [facility];  Service: Gastroenterology    TUMOR REMOVAL      multiple-head, neck, back, hand, right flank, multiple       Family History   Problem Relation Age of Onset    No Known Problems Mother     No Known Problems Father     No Known Problems Sister     No Known Problems Brother     Stroke Maternal Grandmother     Other Maternal Grandmother         benign lesions of liver and pancreas, further details unknown    Cancer Maternal Grandmother     Diabetes Maternal Grandmother     Hypertension Maternal Grandmother     Thyroid disease Maternal Grandmother     Arthritis Maternal Grandfather     Asthma Maternal Grandfather     COPD Paternal Grandmother         Deceased    Miscarriages / Stillbirths Paternal Grandmother     Alcohol abuse Paternal Grandfather         Deceased    No Known Problems Maternal Aunt     No Known Problems Maternal Uncle     No Known Problems Paternal Aunt     No Known Problems Paternal Uncle     Anesthesia problems Neg Hx     Broken bones Neg Hx     Cancer Neg Hx     Clotting disorder Neg Hx     Collagen disease Neg Hx     Diabetes Neg Hx     Dislocations Neg Hx     Fibromyalgia Neg Hx     Gout Neg Hx     Hemophilia Neg Hx     Osteoporosis Neg Hx     Rheumatologic disease Neg Hx     Scoliosis Neg Hx     Severe sprains Neg Hx     Sickle cell anemia Neg Hx     Spinal Compression Fracture Neg Hx     Melanoma Neg Hx     Basal cell carcinoma Neg Hx     Squamous cell carcinoma Neg Hx        Financial Information   Primary Insurance: Payor: BCBS / Plan: BCBS BLUE OPTIONS/PPO/ADV ([place] ONLY) / Product Type: *No Product type* /    Secondary Insurance: None   Prescription Coverage: Nurse, learning disability (listed above)   Preferred Pharmacy: [id number] [place], [place] - [address] AT [address] OF [facility] [address]  [facility]PT [facility]  [facility]  [phone] [place], [place] - [address]  [facility] [place] [facility]  [place] [facility]    Barriers  to taking medication: No    Transition Home   Transportation at time of discharge: Family/Friend's Private Vehicle    Anticipated changes related to Illness: none   Services in place prior to admission: N/A   Services anticipated for DC: N/A   Hemodialysis Prior to Admission: No    Readmission  Risk of Unplanned Readmission Score: UNPLANNED READMISSION SCORE: 24.27%  Readmitted Within the Last 30 Days?   Readmission Factors include: lack of support    Social Determinants of Health  Social Drivers of Health     Food Insecurity: No Food Insecurity (03/10/2023)    Hunger Vital Sign     Worried About Running Out of Food in the Last Year: Never true     Ran Out of Food in the Last Year: Never true   Internet Connectivity: Not on file   Housing/Utilities: Low Risk  (03/10/2023)    Housing/Utilities     Within the past 12 months, have you ever stayed: outside, in a car, in a tent, in an overnight shelter, or temporarily in someone else's home (i.e. couch-surfing)?: No     Are you worried about losing your housing?: No     Within the past 12 months, have you been unable to get utilities (heat, electricity) when it was really needed?: No   Tobacco Use: Low Risk  (03/10/2023)    Patient History     Smoking Tobacco Use: Never     Smokeless Tobacco Use: Never     Passive Exposure: Past   Transportation Needs: No Transportation Needs (03/10/2023)    PRAPARE - Transportation     Lack of Transportation (Medical): No     Lack of Transportation (Non-Medical): No   Alcohol Use: Not on file   Interpersonal Safety: Not on file   Physical Activity: Not on file   Intimate Partner Violence: Not on file   Stress: Not on file   Substance Use: Not on file (01/12/2023)   Social Connections: Not on file   Financial Resource Strain: Low Risk  (03/10/2023)    Overall Financial Resource Strain (CARDIA)     Difficulty of Paying Living Expenses: Not very hard   Depression: Not at risk (01/25/2022)    Received from [facility]    PHQ-2   Health Literacy: Medium Risk (12/10/2022)    Health Literacy     : Rarely       Social History  Support Systems/Concerns: Family Members                          Hotel manager Service: No Conservator, museum/gallery and Psychiatric History  Psychosocial Stressors: Coping with health challenges/recent hospitalization      Psychological Issues/Information: No issues              Chemical Dependency: None              Outpatient Providers: Primary Care Provider   Name / Contact #: Jarold Motto, [facility]     General    [phone]  Legal: No legal issues      Ability to [employer]: No issues accessing community services

## 2023-03-13 NOTE — Unmapped (Signed)
Massage Therapy Inpatient Consult Note    Patient: [name] [name]   Pronouns: she/her/hers    Date of birth: [birth year]    Service: Clinical Massage    Requesting Attending Physician :  Hewitt Blade, MD  Service Requesting Consult : Oncology/Hematology (MDE)  Reason for Consult: relaxation    Visit Date: 03/12/2023    Precautions: No active isolations    Allergies:  Adhesive tape-silicones; Ferrlecit [sodium ferric gluconat-sucrose]; Levofloxacin; Methylnaltrexone; Neomycin; Papaya; Morphine; Zosyn [piperacillin-tazobactam]; Compazine [prochlorperazine]; Iron analogues; Iron dextran; and Latex, natural rubber    Code Status:  Full Code    ----------------------------------------------------------------    Visit Summary:  Aroosh and her family were in the room when I arrived today. She was in good spirits today. She let me know she's been experiencing intense pain leading up to this hospital admission and today finally she feels that it's gotten under better control.     She is able to stand and walk with a walker now! She's lost a lot of weight since I last saw her though. The tumors in her RIGHT leg were palpably larger than I'd felt them before. She had more range of motion in both ankles today and didn't express any pain sensation with passive dorsiflexion. She didn't seem able to really relax today, but I think she still enjoyed the session.    I am so grateful to be a part of [name]'s care team! Thank you for the consult!    I let them know to ask the RN to send me a message whenever they'd like a massage, & otherwise I will check on them weekly! Thank you!  ----------------------------------------------------------------    Treatment Goals: To assist with the management of pain and tension due to disease, treatment, and prolonged hospital stay.       Procedure Detail: Effleurage, Static holds/Light compression, and Gentle rocking/vibration    ----------------------------------------------------------------  I use a modified Walton Pressure Scale (WPS) in an effort to standardize documentation of this critical component of any touch therapy. My hope is to make the pressure I use clearly understandable to all team members involved in the care of each patient I am fortunate to work with.    Level 0: No pressure. Energy work or Management consultant. (No tissue is moved)   Level zero is not included in the WPS, I added it for my practice.    Level 1: Light lotioning (Only the skin is moved)  Level 2: Heavy lotioning (Similar to rubbing in sunscreen. Slight displacement of the superficial adipose layer and superficial muscles)  Level 3: Medium pressure (Medium layers of adipose, muscle, and blood vessels are moved, adjacent tissues move slightly)    Levels 4 and 5 are only used with healthy patients. I do not go beyond a level 3 in my practice.    Level 4: Strong pressure (Deep layers of adipose, muscle, blood vessels, and fascia are moved)  Level 5: (Significant compression of all tissues. Therapists bone affects patient's bone)  ----------------------------------------------------------------    Pressure Level: 0-1 and 0-2    Position: Fowler    Areas of the body massaged: Head/Scalp, Neck, Shoulders, Lower extremities INcluding feet, and Upper extremities INcluding hands      Areas of the body avoided:       Lubricant Used: Hospital lotion with essential oil added:    Music: Spa Music      Treatment Time: 50 minutes    Total time spent on this patient's case today: 1.5  hours    This is visit #:     ----------------------------------------------------------------  Lab Results   Component Value Date    PLT 228 03/12/2023       Current Oncology Treatment: Chemotherapy, Surgery, and Other    Site Restrictions : IV    Position Restrictions: As comfort allows    Personal Protective Equipment Used: Mask and Gloves    ----------------------------------------------------------------    Before treatment: (None / Some / A Lot) or N/A           Pain: Some  Stress/Tension: Some  Swelling: N/A      After treatment: (Better / Same / Worse) or N/A    Pain: Better  Stress/Tension: Better  Swelling: N/A    ------------------------------------------------------------------    Surgical History:  Past Surgical History:   Procedure Laterality Date    COLON SURGERY      cyroablation      cystis removal      desmoid removal      PR CLOSE ENTEROSTOMY,RESEC+ANAST N/A 10/09/2020    Procedure: ILEOSTOMY TAKEDOWN;  Surgeon: Mickle Asper, MD;  Location: OR [place];  Service: General Surgery    PR COLONOSCOPY W/BIOPSY SINGLE/MULTIPLE N/A 10/27/2012    Procedure: COLONOSCOPY, FLEXIBLE, PROXIMAL TO SPLENIC FLEXURE; WITH BIOPSY, SINGLE OR MULTIPLE;  Surgeon: Shirlyn Goltz Mir, MD;  Location: PEDS PROCEDURE ROOM [place];  Service: Gastroenterology    PR COLONOSCOPY W/BIOPSY SINGLE/MULTIPLE N/A 09/14/2013    Procedure: COLONOSCOPY, FLEXIBLE, PROXIMAL TO SPLENIC FLEXURE; WITH BIOPSY, SINGLE OR MULTIPLE;  Surgeon: Shirlyn Goltz Mir, MD;  Location: PEDS PROCEDURE ROOM [place];  Service: Gastroenterology    PR COLONOSCOPY W/BIOPSY SINGLE/MULTIPLE N/A 11/08/2014    Procedure: COLONOSCOPY, FLEXIBLE, PROXIMAL TO SPLENIC FLEXURE; WITH BIOPSY, SINGLE OR MULTIPLE;  Surgeon: Arnold Long Mir, MD;  Location: PEDS PROCEDURE ROOM [facility];  Service: Gastroenterology    PR COLONOSCOPY W/BIOPSY SINGLE/MULTIPLE N/A 12/26/2015    Procedure: COLONOSCOPY, FLEXIBLE, PROXIMAL TO SPLENIC FLEXURE; WITH BIOPSY, SINGLE OR MULTIPLE;  Surgeon: Arnold Long Mir, MD;  Location: PEDS PROCEDURE ROOM [facility];  Service: Gastroenterology    PR COLONOSCOPY W/BIOPSY SINGLE/MULTIPLE N/A 09/02/2017    Procedure: COLONOSCOPY, FLEXIBLE, PROXIMAL TO SPLENIC FLEXURE; WITH BIOPSY, SINGLE OR MULTIPLE;  Surgeon: Arnold Long Mir, MD;  Location: PEDS PROCEDURE ROOM [place];  Service: Gastroenterology    PR COLSC FLX W/REMOVAL LESION BY HOT BX FORCEPS N/A 08/27/2016    Procedure: COLONOSCOPY, FLEXIBLE, PROXIMAL TO SPLENIC FLEXURE; W/REMOVAL TUMOR/POLYP/OTHER LESION, HOT BX FORCEP/CAUTE;  Surgeon: Arnold Long Mir, MD;  Location: PEDS PROCEDURE ROOM [facility];  Service: Gastroenterology    PR COLSC FLX W/RMVL OF TUMOR POLYP LESION SNARE TQ N/A 02/25/2019    Procedure: COLONOSCOPY FLEX; W/REMOV TUMOR/LES BY SNARE;  Surgeon: Helyn Numbers, MD;  Location: [facility] PROC[facility]URES [facility] [facility];  Service: Gastroenterology    PR COLSC FLX W/RMVL OF TUMOR POLYP LESION SNARE TQ N/A 03/13/2020    Procedure: COLONOSCOPY FLEX; W/REMOV TUMOR/LES BY SNARE;  Surgeon: Helyn Numbers, MD;  [facility]: [facility];  Service: Gastroenterology    PR EXC SKIN BENIG 2.1-3 CM TRUNK,ARM,LEG Right 02/25/2017    Procedure: EXCISION, BENIGN LESION INCLUDE MARGINS, EXCEPT SKIN TAG, LEGS; EXCISED DIAMETER 2.1 TO 3.0 CM;  Surgeon: Clarene Duke, MD;  Location: CHILDRENS OR [facility];  Service: Plastics    PR EXC SKIN BENIG 3.1-4 CM TRUNK,ARM,LEG Right 02/25/2017    Procedure: EXCISION, BENIGN LESION INCLUDE MARGINS, EXCEPT SKIN TAG, ARMS; EXCISED DIAMETER 3.1 TO 4.0 CM;  Surgeon: Clarene Duke, MD;  Location: CHILDRENS OR [place];  Service: Plastics    PR EXC SKIN BENIG >4 CM FACE,FACIAL Right 02/25/2017    Procedure: EXCISION, OTHER BENIGN LES INCLUD MARGIN, FACE/EARS/EYELIDS/NOSE/LIPS/MUCOUS MEMBRANE; EXCISED DIAM >4.0 CM;  Surgeon: Clarene Duke, MD;  Location: CHILDRENS OR [facility];  Service: Plastics    PR EXC TUMOR SOFT TISSUE LEG/ANKLE SUBQ 3+CM Right 08/05/2019    Procedure: EXCISION, TUMOR, SOFT TISSUE OF LEG OR ANKLE AREA, SUBCUTANEOUS; 3 CM OR GREATER;  Surgeon: Arsenio Katz, MD;  Location: MAIN OR [place];  Service: Plastics    PR EXC TUMOR SOFT TISSUE LEG/ANKLE SUBQ <3CM Right 08/05/2019    Procedure: EXCISION, TUMOR, SOFT TISSUE OF LEG OR ANKLE AREA, SUBCUTANEOUS; LESS THAN 3 CM;  Surgeon: Arsenio Katz, MD;  Location: MAIN OR [facility];  Service: Plastics    PR LAP, SURG PROCTECTOMY W J-POUCH N/A 08/10/2020    Procedure: ROBOTIC ASSISTED LAPAROSCOPIC PROCTOCOLECTOMY, ILEAL J POUCH, WITH OSTOMY;  Surgeon: Mickle Asper, MD;  Location: OR ;  Service: General Surgery    PR NDSC EVAL INTSTINAL POUCH DX W/COLLJ SPEC SPX N/A 01/23/2021    Procedure: ENDO EVAL SM INTEST POUCH; DX;  Surgeon: Modena Nunnery, MD;  Location: [facility] [facility];  Service: Gastroenterology    PR NDSC EVAL INTSTINAL POUCH DX W/COLLJ SPEC SPX N/A 08/27/2021    Procedure: ENDO EVAL SM INTEST POUCH; DX;  Surgeon: Hunt Oris, MD;  [facility];  Service: Gastroenterology    PR NDSC EVAL INTSTINAL POUCH DX W/COLLJ SPEC SPX N/A 12/09/2021    Procedure: ENDO EVAL SM INTEST POUCH; DX;  Surgeon: Vidal Schwalbe, MD;  [facility];  Service: Gastroenterology    PR NDSC EVAL INTSTINAL POUCH DX W/COLLJ [facility] SPX Left 04/09/2022    Procedure: ENDO EVAL SM INTEST POUCH; DX;  Surgeon: Modena Nunnery, MD;  Location: [facility] [facility];  Service: Gastroenterology    PR NDSC EVAL INTSTINAL POUCH DX W/COLLJ SPEC SPX N/A 08/05/2022    Procedure: ENDO EVAL SM INTEST POUCH; DX;  Surgeon: Modena Nunnery, MD;  [facility]: [facility];  Service: Gastroenterology    PR NDSC EVAL INTSTINAL POUCH W/BX SINGLE/MULTIPLE N/A 01/20/2022    Procedure: ENDOSCOPIC EVAL OF SMALL INTESTINAL POUCH; DIAGNOSTIC, No biopsies;  Surgeon: Andrey Farmer, MD;  [facility]: [facility];  Service: Gastroenterology    PR NDSC EVAL INTSTINAL POUCH W/BX SINGLE/MULTIPLE N/A 02/13/2022    Procedure: ENDOSCOPIC EVAL OF SMALL INTESTINAL POUCH; DIAGNOSTIC, WITH BIOPSY;  Surgeon: Bronson Curb, MD;  [facility]: [facility];  Service: Gastroenterology    PR UNLISTED PROCEDURE SMALL INTESTINE  01/23/2021    Procedure: UNLISTED PROCEDURE, SMALL INTESTINE;  Surgeon: Modena Nunnery, MD;  Location: [facility] [facility];  Service: Gastroenterology    PR UNLISTED PROCEDURE SMALL INTESTINE  02/13/2022    Procedure: UNLISTED PROCEDURE, SMALL INTESTINE;  Surgeon: Bronson Curb, MD;  [facility]: [facility];  Service: Gastroenterology    PR UPPER GI ENDOSCOPY,BIOPSY N/A 10/27/2012    Procedure: UGI ENDOSCOPY; WITH BIOPSY, SINGLE OR MULTIPLE;  Surgeon: Shirlyn Goltz Mir, MD;  Location: PEDS PROCEDURE ROOM [facility];  Service: Gastroenterology    PR UPPER GI ENDOSCOPY,BIOPSY N/A 09/14/2013    Procedure: UGI ENDOSCOPY; WITH BIOPSY, SINGLE OR MULTIPLE;  Surgeon: Shirlyn Goltz Mir, MD;  Location: PEDS PROCEDURE ROOM [facility];  Service: Gastroenterology    PR UPPER GI ENDOSCOPY,BIOPSY N/A 11/08/2014  Procedure: UGI ENDOSCOPY; WITH BIOPSY, SINGLE OR MULTIPLE;  Surgeon: Arnold Long Mir, MD;  Location: PEDS PROCEDURE ROOM [facility];  Service: Gastroenterology    PR UPPER GI ENDOSCOPY,BIOPSY N/A 12/26/2015    Procedure: UGI ENDOSCOPY; WITH BIOPSY, SINGLE OR MULTIPLE;  Surgeon: Arnold Long Mir, MD;  Location: PEDS PROCEDURE ROOM [facility];  Service: Gastroenterology    PR UPPER GI ENDOSCOPY,BIOPSY N/A 08/27/2016    Procedure: UGI ENDOSCOPY; WITH BIOPSY, SINGLE OR MULTIPLE;  Surgeon: Arnold Long Mir, MD;  Location: PEDS PROCEDURE ROOM [facility];  Service: Gastroenterology    PR UPPER GI ENDOSCOPY,BIOPSY N/A 09/02/2017    Procedure: UGI ENDOSCOPY; WITH BIOPSY, SINGLE OR MULTIPLE;  Surgeon: Arnold Long Mir, MD;  Location: PEDS PROCEDURE ROOM [facility];  Service: Gastroenterology    PR UPPER GI ENDOSCOPY,BIOPSY N/A 03/13/2020    Procedure: UGI ENDOSCOPY; WITH BIOPSY, SINGLE OR MULTIPLE;  Surgeon: Helyn Numbers, MD;  Location: [facility] [facility];  Service: Gastroenterology    PR UPPER GI ENDOSCOPY,BIOPSY N/A 09/05/2021    Procedure: UGI ENDOSCOPY; WITH BIOPSY, SINGLE OR MULTIPLE;  Surgeon: Wendall Papa, MD;  [facility]; Service: Gastroenterology    PR UPPER GI ENDOSCOPY,DIAGNOSIS N/A 01/20/2022    Procedure: UGI ENDO, INCLUDE ESOPHAGUS, STOMACH, & DUODENUM &/OR JEJUNUM; DX W/WO COLLECTION SPECIMN, BY BRUSH OR WASH;  Surgeon: Andrey Farmer, MD;  [facility]: [facility];  Service: Gastroenterology    TUMOR REMOVAL      multiple-head, neck, back, hand, right flank, multiple       ----------------------------------------------------------------    Plan/Follow-Up:    A recommendation was made for Clinical Massage at least 1x per every 2 weeks      Patient resting in chair post-session, call light/all needs within reach, fall precautions in place.        I am deeply honored to be a part of this patient's care. Thank you so very much for the opportunity to work with them!  Please reach out with any questions or concerns.   .................................................................................................................................................................................................................  There is no need to put in a new consult at this admission.   If this patient requests massage during this admission, you can send me a message on EPIC chat, or call me on vocera.   ..................................................................................................................................................................................................................  Please feel free to put in a new consult order for massage therapy at any subsequent admission. Thank you so much!   ................................................................................................................................................................................................Marland Kitchen

## 2023-03-13 NOTE — Unmapped (Signed)
Problem: Adult Inpatient Plan of Care  Goal: Plan of Care Review  Outcome: Ongoing - Unchanged  Goal: Patient-Specific Goal (Individualized)  Outcome: Ongoing - Unchanged  Goal: Absence of Hospital-Acquired Illness or Injury  Outcome: Ongoing - Unchanged  Intervention: Identify and Manage Fall Risk  Recent Flowsheet Documentation  Taken 03/13/2023 0600 by Clayton Bibles, RN  Safety Interventions:   fall reduction program maintained   lighting adjusted for tasks/safety   low bed  Taken 03/13/2023 0400 by Clayton Bibles, RN  Safety Interventions:   family at bedside   lighting adjusted for tasks/safety   mattress on floor  Taken 03/13/2023 0200 by Clayton Bibles, RN  Safety Interventions:   family at bedside   lighting adjusted for tasks/safety   low bed  Taken 03/13/2023 0000 by Clayton Bibles, RN  Safety Interventions:   family at bedside   lighting adjusted for tasks/safety   low bed  Taken 03/12/2023 2200 by Clayton Bibles, RN  Safety Interventions:   family at bedside   lighting adjusted for tasks/safety   low bed  Taken 03/12/2023 2000 by Clayton Bibles, RN  Safety Interventions:   family at bedside   lighting adjusted for tasks/safety   low bed  Intervention: Prevent Skin Injury  Recent Flowsheet Documentation  Taken 03/12/2023 2000 by Clayton Bibles, RN  Device Skin Pressure Protection: absorbent pad utilized/changed  Skin Protection: adhesive use limited  Intervention: Prevent Infection  Recent Flowsheet Documentation  Taken 03/13/2023 0600 by Clayton Bibles, RN  Infection Prevention:   cohorting utilized   hand hygiene promoted  Taken 03/13/2023 0400 by Clayton Bibles, RN  Infection Prevention:   cohorting utilized   hand hygiene promoted  Taken 03/13/2023 0200 by Clayton Bibles, RN  Infection Prevention: cohorting utilized  Taken 03/13/2023 0000 by Clayton Bibles, RN  Infection Prevention:   cohorting utilized   hand hygiene promoted  Taken 03/12/2023 2200 by Clayton Bibles, RN  Infection Prevention:   cohorting utilized   hand hygiene promoted  Taken 03/12/2023 2000 by Clayton Bibles, RN  Infection Prevention:   cohorting utilized   hand hygiene promoted  Goal: Optimal Comfort and Wellbeing  Outcome: Ongoing - Unchanged  Goal: Readiness for Transition of Care  Outcome: Ongoing - Unchanged  Goal: Rounds/Family Conference  Outcome: Ongoing - Unchanged     Problem: Pain Acute  Goal: Optimal Pain Control and Function  Outcome: Ongoing - Unchanged     Problem: Latex Allergy  Goal: Absence of Allergy Symptoms  Outcome: Ongoing - Unchanged     Problem: Oncology Care  Goal: Effective Coping  Outcome: Ongoing - Unchanged  Goal: Improved Activity Tolerance  Outcome: Ongoing - Unchanged  Goal: Optimal Oral Intake  Outcome: Ongoing - Unchanged  Intervention: Minimize Barriers to Oral Intake  Recent Flowsheet Documentation  Taken 03/12/2023 2000 by Clayton Bibles, RN  Oral Care:   teeth brushed   tongue brushed  Goal: Improved Oral Mucous Membrane Integrity  Outcome: Ongoing - Unchanged  Intervention: Promote Oral Comfort and Health  Recent Flowsheet Documentation  Taken 03/12/2023 2000 by Clayton Bibles, RN  Oral Mucous Membrane Protection: lip/mouth moisturizer applied  Oral Care:   teeth brushed   tongue brushed  Goal: Optimal Pain Control and Function  Outcome: Ongoing - Unchanged     Problem: Infection  Goal: Absence of Infection Signs and Symptoms  Outcome: Ongoing - Unchanged  Intervention: Prevent or Manage Infection  Recent Flowsheet Documentation  Taken 03/13/2023 0600 by Rajbanshi-Mool, Nidal Rivet, RN  Infection Management: aseptic technique  maintained  Isolation Precautions: protective precautions maintained  Taken 03/13/2023 0400 by Clayton Bibles, RN  Infection Management: aseptic technique maintained  Isolation Precautions: protective precautions maintained  Taken 03/13/2023 0000 by Clayton Bibles, RN  Infection Management: aseptic technique maintained  Isolation Precautions: protective precautions maintained  Taken 03/12/2023 2200 by Clayton Bibles, RN  Infection Management: aseptic technique maintained  Isolation Precautions: protective precautions maintained  Taken 03/12/2023 2000 by Clayton Bibles, RN  Infection Management: aseptic technique maintained  Isolation Precautions: protective precautions maintained     Problem: Self-Care Deficit  Goal: Improved Ability to Complete Activities of Daily Living  Outcome: Ongoing - Unchanged     Problem: Malnutrition  Goal: Improved Nutritional Intake  Outcome: Ongoing - Unchanged     Problem: Fall Injury Risk  Goal: Absence of Fall and Fall-Related Injury  Outcome: Ongoing - Unchanged  Intervention: Promote Injury-Free Environment  Recent Flowsheet Documentation  Taken 03/13/2023 0600 by Clayton Bibles, RN  Safety Interventions:   fall reduction program maintained   lighting adjusted for tasks/safety   low bed  Taken 03/13/2023 0400 by Clayton Bibles, RN  Safety Interventions:   family at bedside   lighting adjusted for tasks/safety   mattress on floor  Taken 03/13/2023 0200 by Clayton Bibles, RN  Safety Interventions:   family at bedside   lighting adjusted for tasks/safety   low bed  Taken 03/13/2023 0000 by Clayton Bibles, RN  Safety Interventions:   family at bedside   lighting adjusted for tasks/safety   low bed  Taken 03/12/2023 2200 by Clayton Bibles, RN  Safety Interventions:   family at bedside   lighting adjusted for tasks/safety   low bed  Taken 03/12/2023 2000 by Clayton Bibles, RN  Safety Interventions:   family at bedside   lighting adjusted for tasks/safety   low bed

## 2023-03-13 NOTE — Unmapped (Signed)
=  Department of Anesthesiology  Pain Medicine Division    Chronic Pain inpatient Consult follow up Note      Requesting Attending Physician:  Hewitt Blade, MD  Service Requesting Consult:  Oncology/Hematology (MDE)    Assessment/Recommendations:  [name] [name] is a 23 year old female with a complex medical history, including DVT (RUE), [name] syndrome (post-colectomy), desmoid tumors, anemia, and severe protein-calorie malnutrition. She is followed at [facility] for chronic abdominal and right shoulder pain, consistent with cancer-associated pain. 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 Evora???s pain has worsened in recent months, likely due to intermittent intestinal obstructions related to her mesenteric desmoid tumors, which may cause intermittent bowel intussusception. Her pain is further complicated by narcotic bowel syndrome, visceral hyperalgesia, and a positive feedback loop where increased opioid use exacerbates the pain. [name]???s most recent hospitalization was complicated by ileus, requiring NG tube, a prolonged inpatient rehab stay.     Recently, [name] [name] had been seen by [name] in December 2024, who decided to switch her from Suboxone to [facility] 20 mcg/h patch and continue oxycodone 10 mg 3 times a day with flexibility to increase to 5 times a day for acute pain flares. However, over the past couple of weeks, she has experienced worsening abdominal and low back pain, along with an increase in stool frequency (up to 15 times a day with watery stools for 4 weeks) and some hematochezia.    On 03/08/23, she presented for an outpatient MRI but experienced a flare-up of her chronic pain after completing one of the three planned studies. She was admitted to the [facility] emergency department for intractable acute on chronic pain.  Upon evaluation, [name] [name] reported uncontrolled pain, rating her pain as 10/10. She noted that oral oxycodone 10 mg was ineffective, and the night team had not administered IV hydromorphone, which has helped her in the past. She felt she had fallen behind on pain control, leading to worsening symptoms today.    In previous hospitalizations, [name] [name] had received ketamine infusions (5/24, 6/6, 8/1) and a lidocaine infusion (09/28/2022). However, there was inconsistency in reporting the effectiveness of these treatments, making it difficult to assess their true benefit.     We did discuss patients admission and care with her primary pain physician, [name].    We have also advised [name] [name] to discuss with [name] to create an ED/Admission care plan to streamline her care, upon her presentation to the hospital in the future. To which [place] and her mother agreeable to.     Interval: Patient just returned from GI procedure. Had pouchoscopy today. Per mother verbal report Some small new ulcer, not bleeding, some pouch inflammation. I dont see official record of it yet. Patient states since the procedure, the pain is worse, severe. But after getting her IV HM 1 mg it is getting tolerable now.     Discussed with the patient about utilizing her oral meds as we have discussed before. [name] [name] she had only used 3 doses in 24 hours.     She was told she had HM 1 mg Iv q4hrs available to use if pain is not controlled with PO meds. She states she often needs to use IV and rquest to increase her dose. We discussed with her again that we would recommend to first utilize PO meds fully and then she can utilize IV to check it's full effectiveness. Patient  keep complaining about inadequate pain control and she is afraid it will go out of control again today. I reassured her again and during discussion we agreed to give her one time additional dose of IV HM 1 mg if she needs it.     Recommendations:  -The chronic pain service is a consult service and does not place orders, just makes recommendations (except ketamine and lidocaine infusions)  -Please evaluate all patients on opioids for appropriateness of prescribing narcan at discharge.  The chronic pain service can assist with this.  Nasal narcan covered by most insurance.  -Recommendations given apply to the current hospitalization and do not reflect long term recommendations.    Recommended to   -Continue Butrans patch 20 mcg/h q. Weekly, home med  -continue oxycodone liquid 20 mg every 4 hours today to assist with post procedure pain flare with plan to go back to oxy 15 mg every 4 hours tomorrow. FIRST line.    -Continue IV hydromorphone 0.5-1 mg every 4 hours as needed, SECOND line  -if she has severe breakthrough pain post procedure related today not relieved with above regimen, can receive one time additional dose of 0.5 or 1 mg IV HM in today only.  --Continue Tylenol 1 g 3 times daily    -Hold movantik (due to diarrhea)    We will continue to follow.    Medications trialed during this admission (or pertinent in past): Hydromorphone IV, oxycodone oral, Butrans patch        History of Present Illness:    Reason for Consult: Acute on chronic abdominal pain    Analgesia Evaluation:  Pain at minimum: 5/10  Pain at maximum: 10/10    Allergies    Allergies   Allergen Reactions    Adhesive Tape-Silicones Hives and Rash     Paper tape and Tegederm ok. Biopatch causes blistering but has tolerated CHG Tegaderm    Ferrlecit [Sodium Ferric Gluconat-Sucrose] Swelling and Rash    Levofloxacin Swelling and Rash     Swelling in mouth, rash,     Methylnaltrexone      Per Patient: I lost bowel control, severe abdominal cramping, and elevated BP    Neomycin Swelling     Rxn after ear drops; ear swelling    Papaya Hives    Morphine Nausea And Vomiting    Zosyn [Piperacillin-Tazobactam] Itching and Rash     Red and itchy    Compazine [Prochlorperazine] Other (See Comments)     Extreme agitation    Iron Analogues     Iron Dextran Itching     Received iron dextran 06/08/22 over 12 hours, had itching and redness/flushing during the infusion and for a couple days after. Required IV benadryl w/flares between doses and ultimately treated w/IV methylpred for 2 days.     Latex, Natural Rubber Rash         Inpatient Medications  Current Facility-Administered Medications   Medication Dose Route Frequency Provider Last Rate Last Admin    acetaminophen (TYLENOL) tablet 1,000 mg  1,000 mg Oral Q8H [facility] Grant Fontana, MD   1,000 mg at 03/13/23 0650    buprenorphine 20 mcg/hour transdermal patch 1 patch  1 patch Transdermal Q7 Days Grant Fontana, MD   1 patch at 03/11/23 1308    cefdinir (OMNICEF) capsule 300 mg  300 mg Oral Q12H [facility] Grant Fontana, MD   300 mg at 03/13/23 1243    cetirizine (ZYRTEC) tablet 20 mg  20 mg  Oral BID Grant Fontana, MD   20 mg at 03/13/23 1243    CHEMO CLARIFICATION ORDER   Other Continuous PRN Grant Fontana, MD        emollient combination no.92 (LUBRIDERM) lotion   Topical Q1H PRN Grant Fontana, MD   Given at 03/11/23 1447    escitalopram oxalate (LEXAPRO) tablet 5 mg  5 mg Oral Daily Grant Fontana, MD   5 mg at 03/13/23 1243    famotidine (PEPCID) tablet 20 mg  20 mg Oral Nightly Grant Fontana, MD   20 mg at 03/12/23 2215    fluticasone propionate (FLONASE) 50 mcg/actuation nasal spray 2 spray  2 spray Topical Q7 Days Grant Fontana, MD   2 spray at 03/09/23 0249    hydrocortisone 2.5 % cream   Topical BID PRN Grant Fontana, MD        HYDROmorphone (PF) (DILAUDID) injection Syrg 0.5 mg  0.5 mg Intravenous Q4H PRN Grant Fontana, MD   0.5 mg at 03/10/23 1346    Or    HYDROmorphone (PF) (DILAUDID) injection 1 mg  1 mg Intravenous Q4H PRN Grant Fontana, MD   1 mg at 03/13/23 1238    IP OKAY TO TREAT   Other Continuous PRN Grant Fontana, MD        lidocaine (ASPERCREME) 4 % 1 patch  1 patch Transdermal Daily Grant Fontana, MD   1 patch at 03/11/23 1250    LORazepam (ATIVAN) tablet 1 mg  1 mg Oral TID PRN Grant Fontana, MD        mirtazapine (REMERON) tablet 7.5 mg  7.5 mg Oral Nightly Grant Fontana, MD   7.5 mg at 03/10/23 2126    [Provider Hold] naloxegol (MOVANTIK) tablet 12.5 mg  12.5 mg Oral Daily Grant Fontana, MD   12.5 mg at 03/10/23 4782    naloxone [facility]) injection 0.4 mg  0.4 mg Intravenous Once PRN Grant Fontana, MD        nirogacestat (OGSIVEO) tablet 150 mg  150 mg Oral BID Grant Fontana, MD   150 mg at 03/13/23 1247    ondansetron (ZOFRAN) injection 8 mg  8 mg Intravenous Q8H PRN Grant Fontana, MD   8 mg at 03/13/23 0650    oxyCODONE (ROXICODONE) 5 mg/5 mL solution 20 mg  20 mg Oral Q4H PRN Grant Fontana, MD   20 mg at 03/12/23 2214    pantoprazole (Protonix) EC tablet 40 mg  40 mg Oral Daily Grant Fontana, MD   40 mg at 03/12/23 1130    promethazine (PHENERGAN) 12.5 mg in sodium chloride (NS) 0.9 % 50 mL IVPB  12.5 mg Intravenous Q8H PRN Grant Fontana, MD   Stopped at 03/13/23 1251    saliva stimulant mucosal spray (BIOTENE)  1 spray Topical Q2H PRN Grant Fontana, MD   1 spray at 03/12/23 1309    scopolamine (TRANSDERM-SCOP) 1 mg over 3 days topical patch 1 mg  1 patch Topical Q72H Grant Fontana, MD   1 mg at 03/11/23 1306    sodium chloride (NS) 0.9 % flush 10 mL  10 mL Intravenous BID Grant Fontana, MD   10 mL at 03/12/23 0839    sodium chloride (NS) 0.9 % flush 10 mL  10 mL Intravenous BID Grant Fontana, MD   10 mL at 03/12/23 2021    sodium chloride (NS) 0.9 %  flush 10 mL  10 mL Intravenous BID Grant Fontana, MD   10 mL at 03/12/23 2021    sodium chloride (NS) 0.9 % infusion  20 mL/hr Intravenous Continuous Grant Fontana, MD   Held at 03/09/23 0250    sucralfate (CARAFATE) oral suspension  1 g Oral QID Grant Fontana, MD   1 g at 03/11/23 1446    triamcinolone (KENALOG) 0.1 % ointment   Topical BID PRN Grant Fontana, MD   Given at 03/13/23 0051           Lab Results   Component Value Date    CREATININE 0.63 03/12/2023       Lab Results   Component Value Date    ALKPHOS 74 03/12/2023    BILITOT 0.2 (L) 03/12/2023    BILIDIR 0.10 10/29/2022    PROT 6.3 03/12/2023    ALBUMIN 3.3 (L) 03/12/2023    ALT 9 (L) 03/12/2023    AST 13 03/12/2023       Encounter Date: 03/08/23   ECG 12 Lead   Result Value    EKG Systolic BP     EKG Diastolic BP     EKG Ventricular Rate 82    EKG Atrial Rate 82    EKG P-R Interval 142    EKG QRS Duration 88    EKG Q-T Interval 382    EKG QTC Calculation 446    EKG Calculated P Axis 53    EKG Calculated R Axis 39    EKG Calculated T Axis 20    QTC Fredericia 424    Narrative    NORMAL SINUS RHYTHM  NONSPECIFIC T WAVE ABNORMALITY  ABNORMAL ECG  WHEN COMPARED WITH ECG OF 02-Nov-2022 08:33,  NONSPECIFIC T WAVE ABNORMALITY NOW EVIDENT IN ANTERIOR LEADS  Confirmed by Pollyann Kennedy ([id number]) on 03/09/2023 7:31:09 PM       No results found for requested labs within last 30 days.       Objective:     Vital Signs  Temp:  [35.8 ??C (96.5 ??F)-36.9 ??C (98.4 ??F)] 36.8 ??C (98.2 ??F)  Heart Rate:  [64-149] 78  Resp:  [7-22] 16  BP: (98-145)/(54-90) 115/62  MAP (mmHg):  [68-106] 76  SpO2:  [95 %-100 %] 98 %    Physical Exam    GENERAL:  Well developed, well-nourished female and is in no apparent distress.   HEAD/NECK:    Reveals normocephalic/atraumatic.   CARDIOVASCULAR:   Regular rate  LUNGS:   Normal work of breathing, no supplemental 02  EXTREMITIES:  Warm, no clubbing, cyanosis, or edema was noted.  NEUROLOGIC:    The patient was alert and oriented times four with normal language, attention, cognition and memory. Cranial nerve exam was grossly normal.  MUSCULOSKELETAL:    Moving all 4 extremities.    SKIN:  No obvious rashes lesions or erythema  PSY:  Appropriate affect and mood.      Problem List    Principal Problem:    Chronic pain due to neoplasm  Active Problems:    [name]    Generalized anxiety disorder with panic attacks    GI bleeding    MDD (major depressive disorder)    Dizziness    Fatigue       Medical Decision Making    Problems Addressed:  1 or more chronic illnesses with severe exacerbation, progression, or side effects of treatment (high) pain still severe at times, up to 10/10  Amount/Complexity:  Reviewed external records:  oncology 03/13/23, GI 03/13/23  Reviewed results:  Labs 03/13/23-Cr, LFTs  Ordered tests: None  Assessment requiring independent historian: None  Discussion of management/test results with medical professional: Primary team  Independent interpretation of test: None    Risk  High: Drug therapy requiring intensive monitoring for toxicity, Decision regarding hospitalization, Elective Major Surgery w/ risk factors, Emergency Major Surgery, Decision to DNR or de-escalate care because of poor prognosis, parental controlled substances- IV opioids      I was immediately available during all portions of the patient's visit and discussed the assessment and plan with the fellow.  I reviewed the chart and the fellow's note and agree with the findings and plan. Criss Rosales, MD

## 2023-03-13 NOTE — Unmapped (Signed)
Patient is alert and oriented x4. Scheduled medications given. Clear liquid diet. PRN pain and PRN nausea medication given. NS 100 ml/hr. Fall precautions and pt safety maintained. Emotional and educational support provided. Family at bedside.     Problem: Adult Inpatient Plan of Care  Goal: Plan of Care Review  Outcome: Ongoing - Unchanged  Goal: Patient-Specific Goal (Individualized)  Outcome: Ongoing - Unchanged  Goal: Absence of Hospital-Acquired Illness or Injury  Outcome: Ongoing - Unchanged  Intervention: Identify and Manage Fall Risk  Recent Flowsheet Documentation  Taken 03/12/2023 1012 by Christella Scheuermann I, RN  Safety Interventions:   family at bedside   lighting adjusted for tasks/safety   low bed   fall reduction program maintained   nonskid shoes/slippers when out of bed  Intervention: Prevent Skin Injury  Recent Flowsheet Documentation  Taken 03/12/2023 1012 by Christella Scheuermann I, RN  Positioning for Skin: Supine/Back  Device Skin Pressure Protection:   absorbent pad utilized/changed   adhesive use limited  Skin Protection: adhesive use limited  Intervention: Prevent Infection  Recent Flowsheet Documentation  Taken 03/12/2023 1012 by Christella Scheuermann I, RN  Infection Prevention:   cohorting utilized   rest/sleep promoted  Goal: Optimal Comfort and Wellbeing  Outcome: Ongoing - Unchanged  Goal: Readiness for Transition of Care  Outcome: Ongoing - Unchanged  Goal: Rounds/Family Conference  Outcome: Ongoing - Unchanged     Problem: Pain Acute  Goal: Optimal Pain Control and Function  Outcome: Ongoing - Unchanged     Problem: Latex Allergy  Goal: Absence of Allergy Symptoms  Outcome: Ongoing - Unchanged     Problem: Oncology Care  Goal: Effective Coping  Outcome: Ongoing - Unchanged  Goal: Improved Activity Tolerance  Outcome: Ongoing - Unchanged  Intervention: Promote Improved Energy  Recent Flowsheet Documentation  Taken 03/12/2023 1012 by Christella Scheuermann I, RN  Activity Management: ambulated to bathroom  Goal: Optimal Oral Intake  Outcome: Ongoing - Unchanged  Goal: Improved Oral Mucous Membrane Integrity  Outcome: Ongoing - Unchanged  Intervention: Promote Oral Comfort and Health  Recent Flowsheet Documentation  Taken 03/12/2023 1012 by Christella Scheuermann I, RN  Oral Mucous Membrane Protection:   lip/mouth moisturizer applied   nonirritating oral fluids promoted  Goal: Optimal Pain Control and Function  Outcome: Ongoing - Unchanged     Problem: Infection  Goal: Absence of Infection Signs and Symptoms  Outcome: Ongoing - Unchanged  Intervention: Prevent or Manage Infection  Recent Flowsheet Documentation  Taken 03/12/2023 1012 by Christella Scheuermann I, RN  Infection Management: aseptic technique maintained  Isolation Precautions: protective precautions maintained     Problem: Self-Care Deficit  Goal: Improved Ability to Complete Activities of Daily Living  Outcome: Ongoing - Unchanged     Problem: Malnutrition  Goal: Improved Nutritional Intake  Outcome: Ongoing - Unchanged     Problem: Fall Injury Risk  Goal: Absence of Fall and Fall-Related Injury  Outcome: Ongoing - Unchanged  Intervention: Promote Injury-Free Environment  Recent Flowsheet Documentation  Taken 03/12/2023 1012 by Christella Scheuermann I, RN  Safety Interventions:   family at bedside   lighting adjusted for tasks/safety   low bed   fall reduction program maintained   nonskid shoes/slippers when out of bed

## 2023-03-13 NOTE — Unmapped (Signed)
Luminal Gastroenterology Consult Service   Progress Note         Assessment and Recommendations:   [name] [name] is a 23 y.o. female with a PMHx of [name] ([name]) s/p proctocolectomy w/ileoanal anastomosis, desmoid tumors (currently on nirogacestat, previously on sorafenib), anemia, previously on TPN and prolonged tube feeds, chronic pain, PTSD who presented to [facility] with recurrent abdominal pain, hematochezia. The patient is seen in consultation at the request of Hewitt Blade, MD (Oncology/Hematology (MDE)) for gastrointestinal bleeding, with concern for recurrent pouchitis vs pouch ulceration.     Pouchitis  Pt reported increased bowel frequency up to 15-20 times daily with associated day time incontinence, urgency, and hematochezia. Hgb mildly decreased from baseline. This is reminiscent of previous episodes of pouchitis. Started cefdinir with improvement of symptoms. Pouchoscopy today 1/3 revealed intact pouch with minimal inflammation and several superficial ulcerations at the pouch anastamosis and pre-pouch inlet, suggestive of healing pouchitis. Biopsies taken. Would continue antibiotics as outlined below and will arrange clinic follow-up.  Rectal bleeding may also be from internal hemorrhoids.  -- continue cefdinir 300mg  BID x4weeks, followed by daily x4 weeks  -- continue to monitor stool output and bleeding  -- follow-up pathology  -- continue to hold movantik while having frequent bowel movements   -- ok to discharge from GI perspective; we will arrange follow-up in gi clinic     Recurrent iron deficiency   Continue iron infusions     Hx of poor PO intake, improving   Pt has been working closely with nutrition as an outpatient. We commend her progress with focusing on PO intake and would not recommend any additional interventions at this time.    Recommendations discussed with the patient's primary team. We will sign-off at this time, please re-contact if additional questions or a new need for consultation arises.    Susy Manor,   Susy Manor, GI fellow  I saw and evaluated the patient, participating in the key portions of the service. I reviewed the fellow's note. I agree with the fellow's findings and plan.  Issues Impacting Complexity of Management: No additional from that noted in the resident/fellow/APP documentation    Aleatha Borer, MD MSc, Professor, Gastroenterology and Hepatology      History of Present Illness:   No acute events overnight. Ready for pouch exam.    -I have reviewed the patient's prior records from [facility] as summarized in the HPI    Objective:   Temp:  [35.8 ??C-36.9 ??C] 35.8 ??C  Heart Rate:  [64-92] 75  Resp:  [7-17] 14  BP: (98-131)/(54-87) 101/54  SpO2:  [97 %-99 %] 99 %    Gen: WDWN female in NAD, answers questions appropriately  Abdomen: Soft, NTND, no rebound/guarding, no hepatosplenomegaly  Extremities: No edema in the BLEs    Pertinent Labs/Studies:  -I have reviewed the patient's labs from 03/13/23 which show stable Hgb and stable renal function (SCr, electrolytes)

## 2023-03-13 NOTE — Unmapped (Signed)
Oncology (MEDO) Progress Note    Assessment & Plan:   [name] [name] is a 23 y.o. female whose presentation is complicated by [name] c/b desmoid fibromatomsis, recurrent ileus, GI bleeding, complex pain, PTSD secondary to chronic medical condition and multiple healthcare interactions who presented to [facility] with refractory pain in setting of large tumor burden and diarrhea secondary to pouchitis.     Principal Problem:    Chronic pain due to neoplasm  Active Problems:    [name]    Generalized anxiety disorder with panic attacks    GI bleeding    MDD (major depressive disorder)    Dizziness    Fatigue    Active Problems    Acute on Chronic Generalized & Abdominal Pain - [name] Burden  Pain Related Nausea   The patient follows with [place] pain management, most recently was seen in clinic in December 2024. It has been documented that IV opioids likely precipitate narcotic bowel syndrome and generates a positive feedback loop. Patient with multiple instances of ileus while hospitalized due to high requirement of opioids. She has been placed on an IV lidocaine drip and IV ketamine in the past for uncontrolled pain, and has been noted by her outpatient pain providers that this would be recommended for future inpatient treatments (see pain management note 02/18/23). She was evaluated by chronic pain and decided to avoid ketamine at this time as she only has one more session available before May 2025. Holding off on lidocaine infusion to see if increased oral opioids dose would be effective as is patient's preference.   - Evaluated by chronic pain team, appreciate recs   - Tylenol 1000 mg 3 times daily scheduled  - Oxycodone 20 mg oral solution q4h PRN, can decrease back to 15 mg tomorrow if able  - Dilaudid 1 mg q4h PRN  - Ok for 1x dose of 1 mg IV dilaudid overnight 1/3 if needed   - Butrans patch   - Lidocaine patch  - Anti-emetics:   - Zofran 8 mg q8h PRN    - Phenergan 12.5 mg IVPB q8h PRN     Concern for Pouchitis   Diarrhea - Hematochezia - Anastomotic ulcers   Dizziness - Fatigue - Malnutrition   Patient reports recent dizziness upon standing and generalized feeling of malaise. Notes 15-20 stools per day this week, increase from baseline of 10 stools per day. She reports she has lost 5lbs over the past week or so. Of note, she did have her corpak removed early December due to blockage. Was on TF regimen Osmolite 1.5 as tolerated, 50-75 mL/h. No objective evidence by imaging or endoscopic findings to suggest etiology of inability to tolerate PO intake. Follows with nutrition outpatient. Suspect patient is orthostatic 2/2 poor PO intake and increased stools which are bloody in nature. She underwent endoscopy of pouch 1/3 after which she had severe left lower quadrant abdominal pain bowel movements with particles and blood which she noted were unusual for her post bowel prep. She was evaluated by GI and exam was reassuring to manage symptomatically, but with low threshold to obtain abdominal x-ray if she worsens.  - GI consulted, appreciate recs   - Cefdinir 300 mg BID x 4 weeks, followed by daily for 4 weeks   - Endoscopic evaluation of pouch 1/3, mild resolving pouchitis visualized   - Follow-up pathology samples   - Hold naloxegol   - encourage PO  - Pantoprazole 40 mg nightly (noon)  - Famotine  20 mg nightly (scheduled for 10pm)     Iron deficiency anemia - History of DVT   Follows with [facility]ology. History of anaphylaxis to IV iron and will need pre and post medication during any treatments. Most recently received FeraHeme during August 2024 hospitalization with rash/itching but overall was able to complete infusion. Ferritin 37, Tsat 13, iron 29. Plan for IV iron this admission with pre-medication in the setting of GI bleeding from known anastomotic ulcer vs pouchitis.   - monitor Hgb daily   - SCDs in place   - Start cetirizine 20 mg BID on 03/11/23 PM for pre-treatment   - Plan to give IV Feraheme 1/5, medication exception obtained by pharmacy team   - Treatment plan for IV iron (per Allergy note 09/24/22):     Pretreatment:  Antihistamines:  - pretreat with antihsitamines the entire WEEK prior to infusion, cetirizine 20 mg (2 tablets) twice daily.   - on the day of infusion, cetirizine 20 qAM, then IV benadryl 50mg  and famotidine 40 mg IV 30 minutes prior to infusion, then cetirizine 20 mg qPM  - Continue cetirizine 20 mg BID for 72 hours after infusion     Fluids:   - I would also start hydration 30 minutes prior to the infusion, with IV fluids     Steroids:  - I would also do a steroid pretreatment with 50mg  prednisone 13 hours prior to the infusion, 50mg  prednisone 7 hours prior to the infusion, then the solumedrol dose 40mg  (IV dose) prior to the infusion.     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 [name].  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  Was most recently seen on 12/17 at which time nirogacestat was continued.  Of note, outpatient MRI was scheduled for restaging of desmoid tumor which was partially completed today. Patient reports she was only able to tolerate the abdomen and pelvis. Her pain was too great to continue.  - Notified Dr. [name] of admission  - Nirogacestat while in hospital, patient supplied and verified by pharmacy (cannot be within 2 hours of pantoprazole or famotidine)   - Outpatient MRI scans obtained inpatient on 03/11/23    Hypercalcemia (resolved)   Total calcium of 10.8. Asymptomatic. iCal normal at 4.66.    PTSD - Generalized Anxiety Disorder - Major Depressive Disorder  PTSD secondary to complex medical condition and interactions with medical system. Follows with [facility].   - continue nightly Mirtazapine 7.5 mg   - Escitalopram 5 mg daily     Issues Impacting Complexity of Management:  -Parenteral controlled medications: IV Dilaudid    Daily Checklist:  Diet: Regular Diet  DVT PPx: SCDs  Electrolytes: Replete Potassium to >/= 3.6 and Magnesium to >/= 1.8  Code Status: Full Code  Dispo: Home pending improvement of pain     Team Contact Information:   Primary Team: Oncology (MEDO)  Primary Resident: Garnet Sierras, MD  Resident's Pager: [phone] (Oncology Intern - Alvester Morin)    Interval History:   Patient was examined after endoscopy today.  She was having severe left lower quadrant abdominal pain which felt different than her typical chronic  tumor pain.  Pain worsened when standing and did not feel adequately controlled by her as needed medications.  She was given a one-time dose of Dilaudid 1 mg IV following endoscopy.    Reevaluated later in the day, LLQ pain worsened. Evaluated by GI again and plan to monitor overnight with symptomatic treatment. No indication for urgent imaging.     Objective:   Temp:  [35.8 ??C-36.9 ??C] 35.8 ??C  Heart Rate:  [64-149] 82  Resp:  [7-22] 19  BP: (98-145)/(54-90) 121/81  SpO2:  [95 %-100 %] 98 %    Gen: NAD  HENT: atraumatic, normocephalic  Heart: regular rate, with periods of tachycardia to 130 on tele   Lungs: normal work of breathing on room air   Abdomen: soft, non distended, LLQ exquisitely tender to light palpation, +rebound LLQ without guarding, RLQ tender on deep palpation without rebound or guarding, see Media tab for bowel movement following endoscopy with particles and blood   Extremities: no edema

## 2023-03-13 NOTE — Unmapped (Signed)
Housing Assistance Resources    1. NCHousingSearch.org  Search Tool for Affordable/low-income housing: http://www.davis-wright.info/  For help searching, please call [phone] (toll free) Monday-Friday, 9:00 a.m. - 8:00 p.m      2. HUD Affordable Housing Search  ClosetRepublicans.fi  You can use this map to find a privately owned apartment with reduced rents.  To apply: contact the apartment management office.     3. HUD Housing Counseling Service  The nationwide network of HUD participating housing counseling agencies have been helping consumers across [place] for more than 50 years by providing the answers they need to make informed housing decisions.  Use the search tool to find a local HUD participating housing counseling agency near you: https://hudgov-answers.https://www.warren.com/  or call toll-free [phone]      4. HUD [employer]  (Serves all of [place])  Assistance with locating affordable housing, avoiding foreclosure, help to stay in your home, etc.  http://www.lowery.com/  Contact us by Phone: To reach our office, you may call us at [phone]. You will be prompted to choose from the following selections:  Enter the extension of the person you are calling if you know their extension or press:  1 - If you know the last name of the person you are calling  3 - For Public Housing/Section 8 inquiries  4 - For our office of Copy and Development  5 - For our office of Fair Housing and Equal Opportunity  6 - For our office of Multifamily Asset Management  7 - For our office of Multifamily Production  8 - For our [employer] and Management Division ([place] Director's office)  9 - For other options    Affordable Housing    1. [employer] and Section 8 Vouchers  [address] [place]   [phone]   Apply online:http://www.gha-Felton.org/     2. [place] [employer], Section 8 Vouchers  [address]  [place], [place] [address]  [phone]  [email or web address]     3. NCHousingSearch.org  Search Tool for Affordable/low-income housing: http://www.davis-wright.info/  For help searching, please call [phone] (toll free) Monday-Friday, 9:00 a.m. - 8:00 p.m        4. HUD Affordable Housing Search  ClosetRepublicans.fi  You can use this map to find a privately owned apartment with reduced rents.  To apply: contact the apartment management office.     5. HUD Housing Counseling Service  The nationwide network of HUD participating housing counseling agencies have been helping consumers across [place] for more than 50 years by providing the answers they need to make informed housing decisions.  Use the search tool to find a local HUD participating housing counseling agency near you: https://hudgov-answers.https://www.[email or web address]/  or call toll-free [phone]      6. HUD [employer]  (Serves all of [place])  Assistance with locating affordable rentals, avoiding foreclosure, help to stay in your home, homeless programs, etc.  http://www.lowery.com/  Contact us by Phone: To reach our office, you may call us at [phone]. You will be prompted to choose from the following selections:  Enter the extension of the person you are calling if you know their extension or press:  1 - If you know the last name of the person you are calling  3 - For Public Housing/Section 8 inquiries  4 - For our office of Copy and Development  5 - For our office of Fair Housing  and Equal Opportunity  6 - For our office of Multifamily Asset Management  7 - For our office of Multifamily Production  8 - For our [employer] and Management Division ([place] Director's office)  9 - For other options    Health visitor Assistance [facility]):    1. WR604  Call 211 to get connected to financial assistance agencies in your area or visit https://www.mckee-[email or web address]/    2. [employer]   Address: [address] [place] [place], [place] [address]   Phone: [phone]   Website: http://greensborourbanministry.org/   Service(s) Offered: Through the Emergency Assistance Program (EAP), financial assistance is provided for costs necessary to maintain housing -utility, water, rent and or mortgage payments.     3. [facility] - [facility]   Address: [phone] [address] [place], [place] [address]   Phone: [phone]   Website: https://www.salvationarmycarolinas.org     4. Housing Consultants Group - [facility] [facility])   Address: [address] [place], [place] [address]   Phone: [phone]   Website: http://schultz.biz/   info@housingconsultantsgroup .org   Service(s) Offered: Home ownership and foreclosure solutions Particularly for the unemployed, underemployed, Dislocated workers, unbanked and under-banked Careers adviser the basics of handling their money and finances, including how to create Positive relationships with financial institutions     5. [facility] DSS  Emergency assistance when funding available for utilities, energy, Psychologist, educational. Medicaid. Medicaid Transportation. Child Support. Etc.  [phone]    BuilderWeekly.hu   [facility]   [address], [place], [place] [address]  [facility]  [address] [place] [place], [place] [address]      Emergency Shelters [facility]):    1. Caring Services - Vet Safety Net  [address] [place], [place]  [address]  [phone]  Population served: Female veterans 18+ with substance abuse issues  Eligibility: By referral only    2. [facility]  [address], [place], [place] [address]  [phone] Ext. [phone]  Population served: Adult men & women  Documents required: Valid ID & Social Security Card    3. [facility] - Pathways  [address]  [place], [place]  [address]  [phone]  Population served: Families with children    4. [facility] - [employer]  [address] [place], [place]  [address]  [phone]  Population served: Single women 18+ without dependents  Documents required:  Valid ID & Social Security Card    5. [facility]  [address], [place], [place]  [address]  [phone]  Population served: Female veterans 18+ with substance abuse/mental health issues  Eligibility: By referral only    6. [facility]  [address] [place], [place] [address]  [phone]  Population served: Males 18+  Documents required: Valid ID & Social Security Card    7. Room at [facility] of [facility] [place], [employer].  [address] [place] [address]  [phone] or [phone]  Population served: Pregnant women with or without children   Documents required: Valid ID & Social Security Card    8. [facility] [place]  [address], [place], [place] [address]  [phone]  Population served: Single adults and families with children  Documents required: Valid ID & another form of identification    9. Field seismologist  [address] [place], [place] [address]  [phone]  Population Served: Families with children, adult women, and adult men.    10. [facility]  [address] [place], [place] [address]  [phone]  Population served: Men 18+, preference for disabled and/or veterans  Eligibility: By referral only    55. Corie Chiquito. [facility]) - Emergency Family Shelter  [address] [place], [place], [place] [address]  [phone]  Population served: Families with children.    12. UY403  Call 211 to get connected to emergency shelter in your area.    Homeless Services/Support [facility]):    1. Interactive Resource Center  Day resource center for people currently facing, experiencing or coming out of homelessness.  Provides fundamental services (showers, laundry, or a mailing address for example), medical and mental health care, case management and employment services.   [address] [place]  Phone: [phone]  email: info@ircgso .org  https://www.interactiveresourcecenter.org/       Eviction & Landlord Mediation [facility]):    1. [facility] for Housing & Community Studies  The Eviction Mediation Project provides relief to an overburdened court system. The mediation service is available to any [facility] or landlord involved in a case where a tenant is at risk of eviction or where eviction papers have been filed.   For eviction mediation assistance, please contact Ren??e Norris at [employer] .edu or at [phone].    2. Legal Aid of [place]  Housing Helpline: [phone]  Assistance w/ issues w/: eviction, landlord refusing housing assistance, repairs/maintenance, public housing and section 8, and other landlord-tenant issues.      Food Assistance [place]:    1. [facility] DSS  Emergency assistance when funding available for utilities, energy, Psychologist, educational. Medicaid. Medicaid Transportation. Child Support. Etc.  [phone]    BuilderWeekly.hu   [facility]   [address], [place], [place] [address]  [facility]  [address] [place], [place] [address]      2. [place] [employer] - Armed forces logistics/support/administrative officer Pantry: The [employer] Food Pantry is a choice pantry where guests get to come in and choose the foods they would like to take home. The pantry provides emergency groceries to community members struggling to access nutritious and adequate amounts of food necessary for a healthy diet.   Hours: 9am-3pm  Residents of [facility] can come to obtain food Monday through Friday from 9:00 am until 3:00 pm. Guests can come back to the pantry once every seven days.   Address: [address], [place] [address]  Phone: [phone]     3. Blessed Table Food Pantry  http://www.theblessedtable.org/   Address: [address], [place], [place] [address]  Phone: [phone]   Tuesday- Friday: 10AM - 1PM     4. Free [employer] - Food Pantry  Address: [address], [place], [place], [address]  Phone: [phone]  Hours: 1st Saturday of each month 9am - 11am    5. [facility] - Food Pantry  Address: [address] [place], [place], [address]  Phone: [phone]  Hours: 2nd and 4th Tuesday of each month, from 5pm - 7pm     6. [facility] - We Care Pantry  Address: [address] [place], [place], [address]  Phone: [phone]  Hours: Wednesdays 6pm - 7pm    7. [employer]  of [facility] - Food Pantry  Address: [address], [place], [place], [address]  Phone: [phone]  Hours: 2nd & 4th Thursday of each month: 1pm - 3pm    8. [employer]   CarDumps.nl   Has a search function to search for food banks in your area: UpholsteryDesigners.gl   For a complete list of Food Banks/Pantries by [place], visit: https://foodbankos.https://weiss.org/       Mental Health Services:    National Suicide and Crisis Hotline: Call or text 988 to speak to a trained mental health professional. 24/7.    [facility]  Provides access to mental health services for children, adolescents, and adults presenting in a mental health crisis. The program is designed for those who need urgent behavioral health or substance use treatment and are not experiencing a medical crisis that would typically require an emergency room visit.  Phone: [phone]  Address: [address] [place], [place] [address]  Hours: Open 24/7, No appointment required.    Mobile Crisis - Therapeutic Alternatives  24-hour Crisis Response Team - call [phone]     [facility] RESOURCES  Counties Served: [place], [place], [place], [place], [place], [place], [place], [place], [place], [place], [place], Engineer, maintenance (IT), [place], [place], [place], [place], [place], [place], [place], [place], [place], [place], [place], [place], [place], [place], [place], [place], [place], [place], [place], [place], [place], [place], [place], [place], [place], [place], [place], [place], [place], [place], [place], [place], Mel[place] Quitter   Crisis Line: [phone]  Website: www.trilliumhealthresources.org    24/7/365 Mobile Crisis    Behavioral Health Crisis Line: [phone] ([place], [place], [place], [place], [place], [place], [place], [place], [place], [place], [place], [address], [place], [place], [place], [place], Ring[place], [place], Will[place], [place], [place], Elbow [place], [place], [place], [place], [place], [place], [place], [place], [place], [place], [place], [place], [place], [place], Pal[place], [place], [place], [place], [place], [place], Magn[place], [place], [place], [place], [employer])    Integrated [employer] Crisis: [phone] [place], [place], [place], [place], [place], [place], [place], [place], Jo[place], Sew[place], [place], [place], [place], [place], [place], [place], [place], Fair[place], [place], [place], [place], [place], [place], Co[place], [place], Sugarlo[place], [place], La [place], [place], [place], [place], [place], [place], [place], [place], [place], [place], [place], [place], [place], [place], [place], [place], [place], [place], [place])      [place]  [address] [place], [place]   Phone: [phone]  Hours: M-F 8am-3pm  Counseling, mental health, substance use treatment    [facility] Service of the [place]  [address] [place], [place]  Phone: [phone]  Hours: M-F 8am to 3pm    [facility]  [address] [place], [place]  Phone: [phone]  Hours: M-F 8am to 3pm  Mental health, substance use treatment    Medication/Medical Care Assistance:     HIV Medication Assistance:  HIV Medication Assistance Program (HMAP)  Medication assistance program for low-income patients diagnosed w/ HIV to afford medications.  HMAP Client Hotline: [phone]   To enroll, complete a Juanell Fairly Program Application:  Form 1: Financial Eligibility and Authorization   Form 2: Eligibility Documentation   Form 3: Income Dentist   Form 4: Verification of No or Low Income  Form 5:  Declaration of Residence  Form 6:  of Self Employment Income (complete if self-employed)  Mail completed application to: [place] Department of Health and Health and safety inspector, Division of Manufacturing engineer of Medical Care Services [address] [place], [place] [address]     HIV/AIDS Hotline: In [place]: 808-688-8680  Call for questions and connections to resources/treatment  [facility]  w/ medical bills at [facility] facilities for low-income individuals.  English Application  Spanish Application    Contact:  Speak with a Financial Navigator Monday through Thursday, 8:30 a.m. - 4:30 p.m. and Friday, 8:30 a.m. - 12:30 p.m. For your privacy and security, you will need to provide your account number and other information before we can discuss your account.  [phone] (toll-free)  [phone]     [facility] Assistance  Assistance w/ medications prescribed at [facility] facilities for low-income individuals w/out insurance.  English Application  Spanish Application    Counselors are available Monday - Friday, from 8 am - 4:30 pm, to discuss the PAP program and answer your questions.  They???re located in the [employer], on the ground floor of the [facility], and also at the [facility]. You can also contact a Pharmacy Assistance Counselor by phone at [phone].     NeedyMeds.org  Website that includes resources and information on locating affordable medications / medication assistance:  NeedyMeds   HELPLINE (800) [phone]

## 2023-03-13 NOTE — Unmapped (Signed)
Luminal Gastroenterology Consult Service   Initial Consultation         Assessment and Recommendations:   [name] [name] is a 23 y.o. 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 The patient is seen in consultation at the request of Hewitt Blade, MD (Oncology/Hematology (MDE)) for gastrointestinal bleeding, with concern for recurrent pouchitis vs pouch ulceration.    Pt presents with a constellation of GI symptoms, the most of concern to her at this time include increased abdominal pain, hemotochezia, and increased bowel frequency. She also has a known history of iron deficiency, with sensitivities to multiple iron formulations.     Hematochezia - history of ileocolonic anastomotic ulcer     She reports onset of hematochezia several weeks ago, present now with 90% of BM. Baseline Hgb appears to be ~13 with admission Hgb being 13.2 and most recent Hgb now 12. Given reported symptoms and previous episodes of ulcerations at ileal anastamosis and fissures differential considerations include recurrent anastomotic ulcerations vs fissure vs pouchitis.   -- Please maintain two large-bore (16-18G) peripheral IVs at all times  -- Keep type & screen active  -- Follow the patient's Hgb every 12-24 hours and transfuse for Hgb <7  -- Stop any NSAIDs, aspirin, antiplatelets, and anticoagulants  -- Pouch exam 1/3 as below    Increase bowel frequency - history of pouchitis   Pt reports increased bowel frequency up to 15-20 times daily with associated day time incontinence and urgency. This is reminiscent of previous episodes of pouchitis. Most recently on cefdinir a few weeks ago, but for shortened course. Not on antibiotics prior to admission. She has history of multiple antibiotic intolerances in the past, but previously done well with cefdinir or doxycycline. Low concern for acute infectious etiology. Bowel frequency also increased in the hosptial on movantik -- started to help prevent against opioid induced ileus. Suspect she has recurrent pouchitis. Lexapro was also recently started -- unclear timing of this relative to symptoms; if this were to be a side effect, would expect this to be shortlived especially on such a low dose. BM improved, with decreased frequency in past 24 hours.   -- contimue cefdinir 300mg  BID   -- continue to hold movantik while having frequent bowel movements     Recurrent iron deficiency   Pt is high risk of iron deficiency with pouch in place and history of poor nutritional status. She has history of intolerance to multiple iron formulations and requires special protocol for transfusions. Iron deficient based on lab 1/1.  -- iron transfusion per previously outlined protocol.     Hx of poor PO intake, improving   Pt has been working closely with nutrition as an outpatient. We commend her progress with focusing on PO intake. She expresses complete lack of desire to start TPN or enteric feedings and would like to focus on continued PO intake. We support and encourage pursuing exclusive PO intake in line with her goals and would not recommend any additional interventions at this time.     GI Pre-Procedure Checklist  Procedure:  Pouch exam  Anticipated Date of Procedure: 1/2 or 1/3   Anticoagulants/Antiplatelets: Not Applicable  Anesthesia Concerns: None  Diet: Order clear liquid and make NPO at 12AM (midnight) day of procedure  Prep: Give 2L miralax + gatorade  prep. Order bowel prep using MED GI PROCEDURES PREP order-set. You must select the appropriate prep, this is not auto-selected.. The patient is adequately prepped when their stool is clear and yellow, like urine. Continue to order bowel prep until the patients stools are clear. The patient can continue drinking prep past midnight if not clear, but must be strictly NPO of all oral intake for at least 2 hours before procedure.    Issues Impacting Complexity of Management:  -None    Recommendations discussed with the patient's primary team. We will continue to follow along with you.    Shela Leff, MD  Aleatha Borer, MD MSc, Professor  Gastroenterology and Hepatology, [facility]      History of Present Illness:   No acute events overnight   Stool frequency decreased to about 7 in 24 hours, some blood   Abdominal pain stable   Able to eat small amounts of food   Observed getting up to walk with PT       Objective:   Temp:  [36.4 ??C (97.5 ??F)-36.9 ??C (98.4 ??F)] 36.4 ??C (97.5 ??F)  Heart Rate:  [58-78] 75  Resp:  [17] 17  BP: (107-131)/(63-87) 117/74  SpO2:  [92 %-99 %] 98 %    Gen: chronically ill appearing female patient in NAD, answers questions appropriately  Abdomen: Soft, NTND, well healed surgical scares   Extremities: No edema in the BLEs    Pertinent Labs/Studies:  -I have reviewed the patient's labs from 03/12/23 which show stable Hgb and stable renal function (SCr, electrolytes)    Relevant work up:  MRI 03/08/2023   Ill defined soft tissue in central lower mesentery, with associated small bowel tethering; grossly similar dilation of bowel proximal to anastomosis  Findings suggestive of iron deposition within the liver      Pouch exam 07/2022 - normal ileum, rectal cuff appeared normal with the exception of small rectal polpys, which were treated with APC   Pouch exam 03/2022 - friable mucosa a ileoanal pouch suture line, treated with APC   Pouch exam 02/2022 - anal fissure, 10mm circumferential ulcer oozing at the ileocolonic anastomosis, treated with APC   Pouch exam 01/2022 - anal fissure, ulcer at ileocolonic anastomosis   EGD 01/2022 - multple polyps in the stomach, normal esophagus, duodenum and jejunum   EGD 08/2021- fundic gland polyps in the stomach, otherwise normal  EGD 2022- fundic gland polyps in the stomach, adenomatous tissue removed from small bowel on random biopsy to rule out celiac disease (none seen endoscopically)

## 2023-03-14 LAB — COMPREHENSIVE METABOLIC PANEL
ALBUMIN: 3.6 g/dL (ref 3.4–5.0)
ALKALINE PHOSPHATASE: 92 U/L (ref 46–116)
ALT (SGPT): 95 U/L — ABNORMAL HIGH (ref 10–49)
ANION GAP: 7 mmol/L (ref 5–14)
AST (SGOT): 77 U/L — ABNORMAL HIGH (ref <=34)
BILIRUBIN TOTAL: 0.2 mg/dL — ABNORMAL LOW (ref 0.3–1.2)
BLOOD UREA NITROGEN: <5 mg/dL — ABNORMAL LOW (ref 9–23)
CALCIUM: 9.6 mg/dL (ref 8.7–10.4)
CHLORIDE: 106 mmol/L (ref 98–107)
CO2: 28 mmol/L (ref 20.0–31.0)
CREATININE: 0.55 mg/dL (ref 0.55–1.02)
EGFR CKD-EPI (2021) FEMALE: >90 mL/min/{1.73_m2} (ref >=60)
GLUCOSE RANDOM: 99 mg/dL (ref 70–179)
POTASSIUM: 4.1 mmol/L (ref 3.4–4.8)
PROTEIN TOTAL: 6.7 g/dL (ref 5.7–8.2)
SODIUM: 141 mmol/L (ref 135–145)

## 2023-03-14 LAB — CBC W/ AUTO DIFF
BASOPHILS ABSOLUTE COUNT: 0 10*9/L (ref 0.0–0.1)
BASOPHILS RELATIVE PERCENT: 0.6 %
EOSINOPHILS ABSOLUTE COUNT: 0.5 10*9/L (ref 0.0–0.5)
EOSINOPHILS RELATIVE PERCENT: 6.3 %
HEMATOCRIT: 40.1 % (ref 34.0–44.0)
HEMOGLOBIN: 13 g/dL (ref 11.3–14.9)
LYMPHOCYTES ABSOLUTE COUNT: 2.7 10*9/L (ref 1.1–3.6)
LYMPHOCYTES RELATIVE PERCENT: 33.1 %
MEAN CORPUSCULAR HEMOGLOBIN CONC: 32.5 g/dL (ref 32.0–36.0)
MEAN CORPUSCULAR HEMOGLOBIN: 26.1 pg (ref 25.9–32.4)
MEAN CORPUSCULAR VOLUME: 80.1 fL (ref 77.6–95.7)
MEAN PLATELET VOLUME: 10.5 fL (ref 6.8–10.7)
MONOCYTES ABSOLUTE COUNT: 0.7 10*9/L (ref 0.3–0.8)
MONOCYTES RELATIVE PERCENT: 8.3 %
NEUTROPHILS ABSOLUTE COUNT: 4.2 10*9/L (ref 1.8–7.8)
NEUTROPHILS RELATIVE PERCENT: 51.7 %
PLATELET COUNT: 237 10*9/L (ref 150–450)
RED BLOOD CELL COUNT: 5 10*12/L (ref 3.95–5.13)
RED CELL DISTRIBUTION WIDTH: 14.9 % (ref 12.2–15.2)
WBC ADJUSTED: 8 10*9/L (ref 3.6–11.2)

## 2023-03-14 LAB — FOLATE: FOLATE: 9.3 ng/mL (ref >=5.4)

## 2023-03-14 LAB — MAGNESIUM: MAGNESIUM: 2.4 mg/dL (ref 1.6–2.6)

## 2023-03-14 LAB — PHOSPHORUS: PHOSPHORUS: 3.9 mg/dL (ref 2.4–5.1)

## 2023-03-14 LAB — VITAMIN B12: VITAMIN B-12: 944 pg/mL — ABNORMAL HIGH (ref 211–911)

## 2023-03-14 MED ADMIN — cefdinir (OMNICEF) capsule 300 mg: 300 mg | ORAL | @ 14:00:00 | Stop: 2023-03-24

## 2023-03-14 MED ADMIN — HYDROmorphone (PF) (DILAUDID) injection 1 mg: 1 mg | INTRAVENOUS | @ 16:00:00 | Stop: 2023-03-14

## 2023-03-14 MED ADMIN — oxyCODONE (ROXICODONE) 5 mg/5 mL solution 20 mg: 20 mg | ORAL | @ 18:00:00 | Stop: 2023-03-17

## 2023-03-14 MED ADMIN — scopolamine (TRANSDERM-SCOP) 1 mg over 3 days topical patch 1 mg: 1 | TOPICAL | @ 18:00:00

## 2023-03-14 MED ADMIN — famotidine (PEPCID) tablet 20 mg: 20 mg | ORAL | @ 04:00:00

## 2023-03-14 MED ADMIN — promethazine (PHENERGAN) 12.5 mg in sodium chloride (NS) 0.9 % 50 mL IVPB: 12.5 mg | INTRAVENOUS | @ 19:00:00

## 2023-03-14 MED ADMIN — HYDROmorphone (PF) (DILAUDID) injection 1 mg: 1 mg | INTRAVENOUS | @ 21:00:00 | Stop: 2023-03-14

## 2023-03-14 MED ADMIN — cefdinir (OMNICEF) capsule 300 mg: 300 mg | ORAL | @ 04:00:00 | Stop: 2023-03-24

## 2023-03-14 MED ADMIN — promethazine (PHENERGAN) 12.5 mg in sodium chloride (NS) 0.9 % 50 mL IVPB: 12.5 mg | INTRAVENOUS | @ 02:00:00

## 2023-03-14 MED ADMIN — nirogacestat (OGSIVEO) tablet 150 mg: 150 mg | ORAL | @ 14:00:00

## 2023-03-14 MED ADMIN — acetaminophen (TYLENOL) tablet 1,000 mg: 1000 mg | ORAL | @ 18:00:00

## 2023-03-14 MED ADMIN — ondansetron (ZOFRAN) injection 8 mg: 8 mg | INTRAVENOUS | @ 16:00:00

## 2023-03-14 MED ADMIN — oxyCODONE (ROXICODONE) 5 mg/5 mL solution 20 mg: 20 mg | ORAL | @ 23:00:00 | Stop: 2023-03-17

## 2023-03-14 MED ADMIN — cetirizine (ZYRTEC) tablet 20 mg: 20 mg | ORAL | @ 14:00:00 | Stop: 2023-03-19

## 2023-03-14 MED ADMIN — sodium chloride (NS) 0.9 % flush 10 mL: 10 mL | INTRAVENOUS | @ 14:00:00

## 2023-03-14 MED ADMIN — sodium chloride (NS) 0.9 % flush 10 mL: 10 mL | INTRAVENOUS | @ 04:00:00

## 2023-03-14 MED ADMIN — acetaminophen (TYLENOL) tablet 1,000 mg: 1000 mg | ORAL | @ 04:00:00

## 2023-03-14 MED ADMIN — HYDROmorphone (PF) (DILAUDID) injection 1 mg: 1 mg | INTRAVENOUS | @ 02:00:00 | Stop: 2023-03-24

## 2023-03-14 MED ADMIN — nirogacestat (OGSIVEO) tablet 150 mg: 150 mg | ORAL | @ 02:00:00

## 2023-03-14 MED ADMIN — escitalopram oxalate (LEXAPRO) tablet 5 mg: 5 mg | ORAL | @ 14:00:00

## 2023-03-14 MED ADMIN — cetirizine (ZYRTEC) tablet 20 mg: 20 mg | ORAL | @ 02:00:00

## 2023-03-14 MED ADMIN — promethazine (PHENERGAN) 12.5 mg in sodium chloride (NS) 0.9 % 50 mL IVPB: 12.5 mg | INTRAVENOUS | @ 11:00:00

## 2023-03-14 MED ADMIN — HYDROmorphone (PF) (DILAUDID) injection 1 mg: 1 mg | INTRAVENOUS | @ 05:00:00 | Stop: 2023-03-14

## 2023-03-14 MED ADMIN — oxyCODONE (ROXICODONE) 5 mg/5 mL solution 20 mg: 20 mg | ORAL | @ 01:00:00 | Stop: 2023-03-17

## 2023-03-14 MED ADMIN — HYDROmorphone (PF) (DILAUDID) injection 1 mg: 1 mg | INTRAVENOUS | @ 11:00:00 | Stop: 2023-03-14

## 2023-03-14 MED ADMIN — acetaminophen (TYLENOL) tablet 1,000 mg: 1000 mg | ORAL | @ 11:00:00

## 2023-03-14 NOTE — Unmapped (Signed)
=  Department of Anesthesiology  Pain Medicine Division    Chronic Pain inpatient Consult follow up Note      Requesting Attending Physician:  Hewitt Blade, MD  Service Requesting Consult:  Oncology/Hematology (MDE)    Assessment/Recommendations:  Jerni Darwin is a 23 year old female with a complex medical history, including DVT (RUE), [name] syndrome (post-colectomy), desmoid tumors, anemia, and severe protein-calorie malnutrition. She is followed at [facility] for chronic abdominal and right shoulder pain, consistent with cancer-associated pain. 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 Takari???s pain has worsened in recent months, likely due to intermittent intestinal obstructions related to her mesenteric desmoid tumors, which may cause intermittent bowel intussusception. Her pain is further complicated by narcotic bowel syndrome, visceral hyperalgesia, and a positive feedback loop where increased opioid use exacerbates the pain. Lidwina???s most recent hospitalization was complicated by ileus, requiring NG tube, a prolonged inpatient rehab stay.     Recently, Konni had been seen by [name] in December 2024, who decided to switch her from Suboxone to [facility] 20 mcg/h patch and continue oxycodone 10 mg 3 times a day with flexibility to increase to 5 times a day for acute pain flares. However, over the past couple of weeks, she has experienced worsening abdominal and low back pain, along with an increase in stool frequency (up to 15 times a day with watery stools for 4 weeks) and some hematochezia.    On 03/08/23, she presented for an outpatient MRI but experienced a flare-up of her chronic pain after completing one of the three planned studies. She was admitted to the [facility] ([place] emergency department for intractable acute on chronic pain.  Upon evaluation, Shawonda reported uncontrolled pain, rating her pain as 10/10. She noted that oral oxycodone 10 mg was ineffective, and the night team had not administered IV hydromorphone, which has helped her in the past. She felt she had fallen behind on pain control, leading to worsening symptoms today.    In previous hospitalizations, Chenae had received ketamine infusions (5/24, 6/6, 8/1) and a lidocaine infusion (09/28/2022). However, there was inconsistency in reporting the effectiveness of these treatments, making it difficult to assess their true benefit.     I contacted Renesha's primary pain provider, [name], to discuss her hospitalization and have been in contact since with updates, to aid [name] in treatment planning.    We have also advised Dalaysia to discuss with [name] to create an ED/Admission care plan to streamline her care, upon her presentation to the hospital in the future. To which Aidan and her mother agreeable to.     Interval: When I visited the patient this morning, she was sleeping comfortably. Her mother, who had just woken up, and I decided not to wake the patient as she seemed to be in a deep sleep.  The mother reported that the patient had a rough day yesterday following a pouchoscopy. Later in the day, she complained of left lower quadrant (LLQ) pain. The GI service was consulted, and after reassessment, they noted no significant clinical concerns regarding post-procedure complications. They advised symptom management and suggested an X-ray if necessary.  The patient's diet was advanced to regular, but the food arrived late at 9 p.m., by which time the patient had already fallen asleep. As a result, she only ate a small portion of her meal. The mother was unsure how many hours the patient  slept, but she seemed to be resting comfortably this morning.  We discussed the use of pain medications, specifically whether there was any reason the patient had not been utilizing oral pain medications (PO) and had been relying more on IV medications, which is against our goal and what we have discussed. The mother explained that she believes the patient feels the IV pain meds are more effective.  The mother also informed me that the patient is scheduled to receive an IV iron infusion tomorrow. Afterward, there may be a short observation period in the hospital, and she anticipates that the patient will be discharged either on Monday or at the latest, Tuesday.  In preparation for the potential discharge within the next 48 to 72 hours, I discussed the importance of transitioning to more reliance on oral pain medications as opposed to IV pain meds. The mother understood and agreed with this approach. We also discussed adjusting the IV pain medication regimen, specifically reducing the frequency of HM IV 1 mg to every 6 hours as needed (PRN) or reducing the dose to 0.5 mg every 4 hours PRN, and weaning further as tolerated. The mother stated she would discuss these options with the patient when she wakes up regarding her preferences.    Recommendations:  -The chronic pain service is a consult service and does not place orders, just makes recommendations (except ketamine and lidocaine infusions)  -Please evaluate all patients on opioids for appropriateness of prescribing narcan at discharge.  The chronic pain service can assist with this.  Nasal narcan covered by most insurance.  -Recommendations given apply to the current hospitalization and do not reflect long term recommendations.    Recommended to   -Continue Butrans patch 20 mcg/h q. Weekly  -wean IV hydromorphone starting today. she can either get 1 mg q6hrs PRN or 0.5 mg q4hrs PRN (per patient preference). With plan to wean further on Sunday.  SECOND LINE, do NOT give unless patient has received a PO PRN within several hours  -continue oxycodone liquid 20 mg every 4 hours, FIRST LINE AGENT.  Will need to start weaning this as well, but need to prioritize IV weaning  --Continue Tylenol 1 g 3 times daily    -Hold movantik (due to diarrhea)    We will continue to follow.    Medications trialed during this admission (or pertinent in past): Hydromorphone IV, oxycodone oral, Butrans patch      Analgesia Evaluation:  Pain at minimum: 8/10  Pain at maximum: 10/10    Allergies    Allergies   Allergen Reactions    Adhesive Tape-Silicones Hives and Rash     Paper tape and Tegederm ok. Biopatch causes blistering but has tolerated CHG Tegaderm    Ferrlecit [Sodium Ferric Gluconat-Sucrose] Swelling and Rash    Levofloxacin Swelling and Rash     Swelling in mouth, rash,     Methylnaltrexone      Per Patient: I lost bowel control, severe abdominal cramping, and elevated BP    Neomycin Swelling     Rxn after ear drops; ear swelling    Papaya Hives    Morphine Nausea And Vomiting    Zosyn [Piperacillin-Tazobactam] Itching and Rash     Red and itchy    Compazine [Prochlorperazine] Other (See Comments)     Extreme agitation    Iron Analogues     Iron Dextran Itching     Received iron dextran 06/08/22 over 12 hours, had itching and redness/flushing during the infusion  and for a couple days after. Required IV benadryl w/flares between doses and ultimately treated w/IV methylpred for 2 days.     Latex, Natural Rubber Rash         Inpatient Medications  Current Facility-Administered Medications   Medication Dose Route Frequency Provider Last Rate Last Admin    acetaminophen (TYLENOL) tablet 1,000 mg  1,000 mg Oral Q8H SCH Grant Fontana, MD   1,000 mg at 03/14/23 1610    buprenorphine 20 mcg/hour transdermal patch 1 patch  1 patch Transdermal Q7 Days Grant Fontana, MD   1 patch at 03/11/23 1308    cefdinir (OMNICEF) capsule 300 mg  300 mg Oral Q12H SCH Grant Fontana, MD   300 mg at 03/14/23 9604    cetirizine (ZYRTEC) tablet 20 mg  20 mg Oral BID Garnet Sierras, MD   20 mg at 03/14/23 0911    CHEMO CLARIFICATION ORDER   Other Continuous PRN Grant Fontana, MD        [START ON 03/15/2023] diphenhydrAMINE (BENADRYL) injection  50 mg Intravenous Once Garnet Sierras, MD        Melene Muller ON 03/15/2023] diphenhydrAMINE (BENADRYL) injection  50 mg Intravenous Once PRN Garnet Sierras, MD        emollient combination no.92 (LUBRIDERM) lotion   Topical Q1H PRN Grant Fontana, MD   Given at 03/11/23 1447    escitalopram oxalate (LEXAPRO) tablet 5 mg  5 mg Oral Daily Grant Fontana, MD   5 mg at 03/14/23 0911    famotidine (PEPCID) tablet 20 mg  20 mg Oral Nightly Grant Fontana, MD   20 mg at 03/13/23 2255    [START ON 03/15/2023] famotidine (PF) (PEPCID) injection 40 mg  40 mg Intravenous Once Garnet Sierras, MD        Melene Muller ON 03/15/2023] ferumoxytol (FERAHEME) 510 mg in sodium chloride (NS) 0.9 % 100 mL IVPB  510 mg Intravenous Once Gogerly-Moragoda, Ellene Route, MD        fluticasone propionate (FLONASE) 50 mcg/actuation nasal spray 2 spray  2 spray Topical Q7 Days Grant Fontana, MD   2 spray at 03/09/23 0249    hydrocortisone 2.5 % cream   Topical BID PRN Grant Fontana, MD        HYDROmorphone (PF) (DILAUDID) injection Syrg 0.5 mg  0.5 mg Intravenous Q4H PRN Grant Fontana, MD   0.5 mg at 03/10/23 1346    Or    HYDROmorphone (PF) (DILAUDID) injection 1 mg  1 mg Intravenous Q4H PRN Grant Fontana, MD   1 mg at 03/14/23 1038    IP OKAY TO TREAT   Other Continuous PRN Grant Fontana, MD        lidocaine (ASPERCREME) 4 % 1 patch  1 patch Transdermal Daily Grant Fontana, MD   1 patch at 03/11/23 1250    LORazepam (ATIVAN) tablet 1 mg  1 mg Oral TID PRN Grant Fontana, MD        predniSONE (DELTASONE) tablet 50 mg  50 mg Oral Daily Gogerly-Moragoda, Ellene Route, MD        Followed by    Melene Muller ON 03/15/2023] predniSONE (DELTASONE) tablet 50 mg  50 mg Oral Daily Gogerly-Moragoda, Ellene Route, MD        Followed by    Melene Muller ON 03/15/2023] methylPREDNISolone sodium succinate (PF) (SOLU-Medrol) injection 40 mg  40 mg Intravenous Once Garnet Sierras, MD  mirtazapine (REMERON) tablet 7.5 mg  7.5 mg Oral Nightly Grant Fontana, MD   7.5 mg at 03/10/23 2126    [Provider Hold] naloxegol (MOVANTIK) tablet 12.5 mg  12.5 mg Oral Daily Grant Fontana, MD   12.5 mg at 03/10/23 4540    naloxone [facility]) injection 0.4 mg  0.4 mg Intravenous Once PRN Grant Fontana, MD        nirogacestat (OGSIVEO) tablet 150 mg  150 mg Oral BID Grant Fontana, MD   150 mg at 03/14/23 0912    ondansetron [facility]) injection 8 mg  8 mg Intravenous Q8H PRN Grant Fontana, MD   8 mg at 03/14/23 1038    oxyCODONE (ROXICODONE) 5 mg/5 mL solution 20 mg  20 mg Oral Q4H PRN Grant Fontana, MD   20 mg at 03/13/23 1931    pantoprazole (Protonix) EC tablet 40 mg  40 mg Oral Daily Grant Fontana, MD   40 mg at 03/12/23 1130    promethazine (PHENERGAN) 12.5 mg in sodium chloride (NS) 0.9 % 50 mL IVPB  12.5 mg Intravenous Q8H PRN Grant Fontana, MD   Stopped at 03/14/23 [phone]    saliva stimulant mucosal spray (BIOTENE)  1 spray Topical Q2H PRN Grant Fontana, MD   1 spray at 03/12/23 1309    scopolamine (TRANSDERM-SCOP) 1 mg over 3 days topical patch 1 mg  1 patch Topical Q72H Grant Fontana, MD   1 mg at 03/11/23 1306    sodium chloride (NS) 0.9 % flush 10 mL  10 mL Intravenous BID Grant Fontana, MD   10 mL at 03/14/23 0911    sodium chloride (NS) 0.9 % flush 10 mL  10 mL Intravenous BID Grant Fontana, MD   10 mL at 03/12/23 2021    sodium chloride (NS) 0.9 % flush 10 mL  10 mL Intravenous BID Grant Fontana, MD   10 mL at 03/12/23 2021    sodium chloride (NS) 0.9 % infusion  20 mL/hr Intravenous Continuous Grant Fontana, MD   Held at 03/09/23 0250    [START ON 03/15/2023] sodium chloride 0.9% (NS) bolus 1,000 mL  1,000 mL Intravenous Once Garnet Sierras, MD        sucralfate (CARAFATE) oral suspension  1 g Oral QID Grant Fontana, MD   1 g at 03/11/23 1446    triamcinolone (KENALOG) 0.1 % ointment   Topical BID PRN Grant Fontana, MD   Given at 03/13/23 0051 Lab Results   Component Value Date    CREATININE 0.60 03/13/2023       Lab Results   Component Value Date    ALKPHOS 68 03/13/2023    BILITOT 0.3 03/13/2023    BILIDIR 0.10 10/29/2022    PROT 6.3 03/13/2023    ALBUMIN 3.4 03/13/2023    ALT 12 03/13/2023    AST 18 03/13/2023       Encounter Date: 03/08/23   ECG 12 Lead   Result Value    EKG Systolic BP     EKG Diastolic BP     EKG Ventricular Rate 82    EKG Atrial Rate 82    EKG P-R Interval 142    EKG QRS Duration 88    EKG Q-T Interval 382    EKG QTC Calculation 446    EKG Calculated P Axis 53    EKG Calculated R Axis 39    EKG Calculated T Axis  20    QTC Fredericia 424    Narrative    NORMAL SINUS RHYTHM  NONSPECIFIC T WAVE ABNORMALITY  ABNORMAL ECG  WHEN COMPARED WITH ECG OF 02-Nov-2022 08:33,  NONSPECIFIC T WAVE ABNORMALITY NOW EVIDENT IN ANTERIOR LEADS  Confirmed by Pollyann Kennedy ([id number]) on 03/09/2023 7:31:09 PM       No results found for requested labs within last 30 days.       Objective:     Vital Signs  Temp:  [36.5 ??C (97.7 ??F)-37.1 ??C (98.8 ??F)] 36.5 ??C (97.7 ??F)  Heart Rate:  [66-89] 66  Resp:  [16-18] 17  BP: (115-119)/(62-82) 119/73  MAP (mmHg):  [76-93] 87  SpO2:  [98 %-100 %] 98 %    Physical Exam    GENERAL:  Well developed, well-nourished female and is in no apparent distress.  Resting comfortably this AM  HEAD/NECK:    Reveals normocephalic/atraumatic.   CARDIOVASCULAR:   Regular rate  LUNGS:   Normal work of breathing, no supplemental 02  MUSCULOSKELETAL:    Moving all 4 extremities.    SKIN:  No obvious rashes lesions or erythema  PSY:  Appropriate affect and mood.      Problem List    Principal Problem:    Chronic pain due to neoplasm  Active Problems:    [name]    Generalized anxiety disorder with panic attacks    GI bleeding    MDD (major depressive disorder)    Dizziness    Fatigue       Medical Decision Making    Problems Addressed:  1 or more chronic illnesses with severe exacerbation, progression, or side effects of treatment (high) pain still severe at times, up to 10/10    Amount/Complexity:  Reviewed external records: None  Reviewed results:  Labs 03/14/23-Cr, LFTs  Ordered tests: None  Assessment requiring independent historian: None  Discussion of management/test results with medical professional: Primary team  Independent interpretation of test: None    Risk  High: Drug therapy requiring intensive monitoring for toxicity, Decision regarding hospitalization, Elective Major Surgery w/ risk factors, Emergency Major Surgery, Decision to DNR or de-escalate care because of poor prognosis, parental controlled substances- IV opioids      I was immediately available during all portions of the patient's visit and discussed the assessment and plan with the fellow.  I reviewed the chart and the fellow's note and agree with the findings and plan. Criss Rosales, MD

## 2023-03-14 NOTE — Unmapped (Signed)
PRN phenergan given for neasea. PRN dilaudid given X2 for acute pain. Pt anxious overnight. Family visiting at bedside. No acute events. Did not endorse worsening abdominal pain.     Problem: Adult Inpatient Plan of Care  Goal: Plan of Care Review  Outcome: Ongoing - Unchanged  Flowsheets (Taken 03/14/2023 0516)  Progress: no change  Plan of Care Reviewed With: patient  Goal: Patient-Specific Goal (Individualized)  Outcome: Ongoing - Unchanged  Goal: Absence of Hospital-Acquired Illness or Injury  Outcome: Ongoing - Unchanged  Intervention: Identify and Manage Fall Risk  Recent Flowsheet Documentation  Taken 03/14/2023 0415 by Althea Charon, RN  Safety Interventions:   lighting adjusted for tasks/safety   low bed   family at bedside  Taken 03/14/2023 0230 by Althea Charon, RN  Safety Interventions:   lighting adjusted for tasks/safety   low bed   family at bedside  Taken 03/14/2023 0030 by Althea Charon, RN  Safety Interventions:   lighting adjusted for tasks/safety   low bed   family at bedside  Taken 03/13/2023 2230 by Althea Charon, RN  Safety Interventions:   lighting adjusted for tasks/safety   low bed   family at bedside  Taken 03/13/2023 2030 by Althea Charon, RN  Safety Interventions:   lighting adjusted for tasks/safety   low bed   family at bedside  Intervention: Prevent Skin Injury  Recent Flowsheet Documentation  Taken 03/14/2023 0415 by Althea Charon, RN  Positioning for Skin: Supine/Back  Device Skin Pressure Protection: adhesive use limited  Skin Protection: adhesive use limited  Taken 03/14/2023 0230 by Althea Charon, RN  Positioning for Skin: Supine/Back  Device Skin Pressure Protection: adhesive use limited  Skin Protection: adhesive use limited  Taken 03/14/2023 0030 by Althea Charon, RN  Positioning for Skin: Supine/Back  Device Skin Pressure Protection: adhesive use limited  Skin Protection: adhesive use limited  Taken 03/13/2023 2330 by Althea Charon, RN  Positioning for Skin: Supine/Back  Device Skin Pressure Protection: absorbent pad utilized/changed  Skin Protection: adhesive use limited  Taken 03/13/2023 2230 by Althea Charon, RN  Positioning for Skin: Supine/Back  Device Skin Pressure Protection: adhesive use limited  Skin Protection: adhesive use limited  Taken 03/13/2023 2030 by Althea Charon, RN  Positioning for Skin: Supine/Back  Device Skin Pressure Protection: adhesive use limited  Skin Protection: adhesive use limited  Intervention: Prevent Infection  Recent Flowsheet Documentation  Taken 03/14/2023 0415 by Althea Charon, RN  Infection Prevention: cohorting utilized  Taken 03/14/2023 0230 by Althea Charon, RN  Infection Prevention: cohorting utilized  Taken 03/14/2023 0030 by Althea Charon, RN  Infection Prevention: cohorting utilized  Taken 03/13/2023 2230 by Althea Charon, RN  Infection Prevention: cohorting utilized  Taken 03/13/2023 2030 by Althea Charon, RN  Infection Prevention: cohorting utilized  Goal: Optimal Comfort and Wellbeing  Outcome: Ongoing - Unchanged  Goal: Readiness for Transition of Care  Outcome: Ongoing - Unchanged  Goal: Rounds/Family Conference  Outcome: Ongoing - Unchanged     Problem: Pain Acute  Goal: Optimal Pain Control and Function  Outcome: Ongoing - Unchanged     Problem: Latex Allergy  Goal: Absence of Allergy Symptoms  Outcome: Ongoing - Unchanged     Problem: Oncology Care  Goal: Effective Coping  Outcome: Ongoing - Unchanged  Goal: Improved Activity Tolerance  Outcome: Ongoing - Unchanged  Goal: Optimal Oral Intake  Outcome: Ongoing - Unchanged  Goal: Improved Oral Mucous Membrane Integrity  Outcome: Ongoing - Unchanged  Goal: Optimal Pain Control and Function  Outcome: Ongoing -  Unchanged     Problem: Infection  Goal: Absence of Infection Signs and Symptoms  Outcome: Ongoing - Unchanged  Intervention: Prevent or Manage Infection  Recent Flowsheet Documentation  Taken 03/14/2023 0415 by Althea Charon, RN  Infection Management: aseptic technique maintained  Taken 03/14/2023 0230 by Althea Charon, RN  Infection Management: aseptic technique maintained  Taken 03/14/2023 0030 by Althea Charon, RN  Infection Management: aseptic technique maintained  Taken 03/13/2023 2230 by Althea Charon, RN  Infection Management: aseptic technique maintained  Taken 03/13/2023 2030 by Althea Charon, RN  Infection Management: aseptic technique maintained     Problem: Self-Care Deficit  Goal: Improved Ability to Complete Activities of Daily Living  Outcome: Ongoing - Unchanged     Problem: Malnutrition  Goal: Improved Nutritional Intake  Outcome: Ongoing - Unchanged     Problem: Fall Injury Risk  Goal: Absence of Fall and Fall-Related Injury  Outcome: Ongoing - Unchanged  Intervention: Promote Injury-Free Environment  Recent Flowsheet Documentation  Taken 03/14/2023 0415 by Althea Charon, RN  Safety Interventions:   lighting adjusted for tasks/safety   low bed   family at bedside  Taken 03/14/2023 0230 by Althea Charon, RN  Safety Interventions:   lighting adjusted for tasks/safety   low bed   family at bedside  Taken 03/14/2023 0030 by Althea Charon, RN  Safety Interventions:   lighting adjusted for tasks/safety   low bed   family at bedside  Taken 03/13/2023 2230 by Althea Charon, RN  Safety Interventions:   lighting adjusted for tasks/safety   low bed   family at bedside  Taken 03/13/2023 2030 by Althea Charon, RN  Safety Interventions:   lighting adjusted for tasks/safety   low bed   family at bedside

## 2023-03-14 NOTE — Unmapped (Signed)
Pt went to a Procedure this morning. Severe pain after procedure. Provider notified. PRN pain and PRN nausea medication given. Emotional and educational support provided. Family at bedside.    Problem: Adult Inpatient Plan of Care  Goal: Plan of Care Review  Outcome: Ongoing - Unchanged  Goal: Patient-Specific Goal (Individualized)  Outcome: Ongoing - Unchanged  Goal: Absence of Hospital-Acquired Illness or Injury  Outcome: Ongoing - Unchanged  Intervention: Identify and Manage Fall Risk  Recent Flowsheet Documentation  Taken 03/13/2023 1059 by Christella Scheuermann I, RN  Safety Interventions:   lighting adjusted for tasks/safety   low bed   fall reduction program maintained   family at bedside   nonskid shoes/slippers when out of bed  Intervention: Prevent Skin Injury  Recent Flowsheet Documentation  Taken 03/13/2023 1059 by Christella Scheuermann I, RN  Positioning for Skin: Supine/Back  Device Skin Pressure Protection:   absorbent pad utilized/changed   adhesive use limited  Skin Protection: adhesive use limited  Intervention: Prevent Infection  Recent Flowsheet Documentation  Taken 03/13/2023 1059 by Christella Scheuermann I, RN  Infection Prevention:   cohorting utilized   rest/sleep promoted  Goal: Optimal Comfort and Wellbeing  Outcome: Ongoing - Unchanged  Goal: Readiness for Transition of Care  Outcome: Ongoing - Unchanged  Goal: Rounds/Family Conference  Outcome: Ongoing - Unchanged     Problem: Pain Acute  Goal: Optimal Pain Control and Function  Outcome: Ongoing - Unchanged     Problem: Latex Allergy  Goal: Absence of Allergy Symptoms  Outcome: Ongoing - Unchanged     Problem: Oncology Care  Goal: Effective Coping  Outcome: Ongoing - Unchanged  Goal: Improved Activity Tolerance  Outcome: Ongoing - Unchanged  Intervention: Promote Improved Energy  Recent Flowsheet Documentation  Taken 03/13/2023 1059 by Christella Scheuermann I, RN  Activity Management: ambulated to bathroom  Goal: Optimal Oral Intake  Outcome: Ongoing - Unchanged  Goal: Improved Oral Mucous Membrane Integrity  Outcome: Ongoing - Unchanged  Intervention: Promote Oral Comfort and Health  Recent Flowsheet Documentation  Taken 03/13/2023 1059 by Christella Scheuermann I, RN  Oral Mucous Membrane Protection:   lip/mouth moisturizer applied   nonirritating oral fluids promoted  Goal: Optimal Pain Control and Function  Outcome: Ongoing - Unchanged     Problem: Infection  Goal: Absence of Infection Signs and Symptoms  Outcome: Ongoing - Unchanged  Intervention: Prevent or Manage Infection  Recent Flowsheet Documentation  Taken 03/13/2023 1059 by Christella Scheuermann I, RN  Infection Management: aseptic technique maintained     Problem: Self-Care Deficit  Goal: Improved Ability to Complete Activities of Daily Living  Outcome: Ongoing - Unchanged     Problem: Malnutrition  Goal: Improved Nutritional Intake  Outcome: Ongoing - Unchanged     Problem: Fall Injury Risk  Goal: Absence of Fall and Fall-Related Injury  Outcome: Ongoing - Unchanged  Intervention: Promote Injury-Free Environment  Recent Flowsheet Documentation  Taken 03/13/2023 1059 by Christella Scheuermann I, RN  Safety Interventions:   lighting adjusted for tasks/safety   low bed   fall reduction program maintained   family at bedside   nonskid shoes/slippers when out of bed

## 2023-03-14 NOTE — Unmapped (Signed)
Oncology (MEDO) Progress Note    Assessment & Plan:   [name] [name] is a 23 y.o. female whose presentation is complicated by [name] c/b desmoid fibromatomsis, recurrent ileus, GI bleeding, complex pain, PTSD secondary to chronic medical condition and multiple healthcare interactions who presented to [facility] with refractory pain in setting of large tumor burden and diarrhea secondary to pouchitis.     Principal Problem:    Chronic pain due to neoplasm  Active Problems:    [name]    Generalized anxiety disorder with panic attacks    GI bleeding    MDD (major depressive disorder)    Dizziness    Fatigue    Active Problems    Acute on Chronic Generalized & Abdominal Pain - [name] Burden  Pain Related Nausea   The patient follows with [facility] pain management, most recently was seen in clinic in December 2024. It has been documented that IV opioids likely precipitate narcotic bowel syndrome and generates a positive feedback loop. Patient with multiple instances of ileus while hospitalized due to high requirement of opioids. She has been placed on an IV lidocaine drip and IV ketamine in the past for uncontrolled pain, and has been noted by her outpatient pain providers that this would be recommended for future inpatient treatments (see pain management note 02/18/23). She was evaluated by chronic pain and decided to avoid ketamine at this time as she only has one more session available before May 2025. Holding off on lidocaine infusion to see if increased oral opioids dose would be effective as is patient's preference.   - Evaluated by chronic pain team, appreciate recs   - Tylenol 1000 mg 3 times daily scheduled  - Oxycodone 20 mg oral solution q4h PRN, encouraged patient to take regularly today   - Dilaudid 1 mg q4h PRN -> will discuss with patient whether she prefers 0.5 mg q4h or 1 mg q6h for taper   - Butrans patch   - Lidocaine patch  - Anti-emetics: will discuss weaning off IV with patient    - Zofran 8 mg q8h PRN    - Phenergan 12.5 mg IVPB q8h PRN    Iron deficiency anemia - History of DVT   Follows with [facility]. History of anaphylaxis to IV iron and will need pre and post medication during any treatments. Most recently received FeraHeme during August 2024 hospitalization with rash/itching but overall was able to complete infusion. Ferritin 37, Tsat 13, iron 29. Iron deficit ~500 mg. Plan for IV iron this admission with pre-medication.   - monitor Hgb daily   - SCDs in place   - Start cetirizine 20 mg BID on 03/11/23 PM for pre-treatment. Continue BID for 72 hours after IV iron infusion.   - Plan to give IV Feraheme (1/5 @ 1100)  - Medication exception obtained by pharmacy team  - Infuse 500 mg over 120 minutes (prior infusion of 1000 mg over 120 minutes led to mild reaction)  - Pre-medications:   - 13 hours prior (1/4 @ 2200):    - Prednisone 50 mg    - 7 hours prior (1/5 @ 0400):   - Prednisone 50 mg    - 30 minutes prior (1/5 @ 1030):  - IV solumedrol 40 mg     - IV benadryl 50 mg     - IV famotidine 40 mg     - IV NS 1L bolus   - during IV iron infusion:    -  slow infusion rate as needed     - PRN IV benadryl 50 mg     Concern for Pouchitis   Diarrhea - Hematochezia - Anastomotic ulcers   Dizziness - Fatigue - Malnutrition   Patient reports recent dizziness upon standing and generalized feeling of malaise. Notes 15-20 stools per day this week, increase from baseline of 10 stools per day. She reports she has lost 5lbs over the past week or so. Of note, she did have her corpak removed early December due to blockage. Was on TF regimen Osmolite 1.5 as tolerated, 50-75 mL/h. No objective evidence by imaging or endoscopic findings to suggest etiology of inability to tolerate PO intake. Follows with nutrition outpatient. Suspect patient is orthostatic 2/2 poor PO intake and increased stools which are bloody in nature. She underwent endoscopy of pouch 1/3 after which she had severe left lower quadrant abdominal pain bowel movements with particles and blood which she noted were unusual for her post bowel prep. She was evaluated by GI and exam was reassuring to manage symptomatically, but with low threshold to obtain abdominal x-ray if she worsens.  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 [name].  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  Was most recently seen on 12/17 at which time nirogacestat was continued.  Of note, outpatient MRI was scheduled for restaging of desmoid tumor which was partially completed today. Patient reports she was only able to tolerate the abdomen and pelvis. Her pain was too great to continue.  - Notified Dr. [name] of admission  - Nirogacestat while in hospital, patient supplied and verified by pharmacy (cannot be within 2 hours of pantoprazole or famotidine)   - Outpatient MRI scans obtained inpatient on 03/11/23    Hypercalcemia (resolved)   Total calcium of 10.8. Asymptomatic. iCal normal at 4.66.    PTSD - Generalized Anxiety Disorder - Major Depressive Disorder  PTSD secondary to complex medical condition and interactions with medical system. Follows with [facility].   - continue nightly Mirtazapine 7.5 mg   - Escitalopram 5 mg daily     Issues Impacting Complexity of Management:  -Parenteral controlled medications: IV Dilaudid    Daily Checklist:  Diet: Regular Diet  DVT PPx: SCDs  Electrolytes: Replete Potassium to >/= 3.6 and Magnesium to >/= 1.8  Code Status: Full Code  Dispo: Home pending improvement of pain     Team Contact Information:   Primary Team: Oncology (MEDO)  Primary Resident: Garnet Sierras, MD  Resident's Pager: [phone] (Oncology Intern - Alvester Morin)    Interval History:   LLQ pain still persists but is not worse. She notes that it worsens when she stands and she will keep [name] updated once she gets up for the day.     Nausea is improved from yesterday.    Objective:   Temp:  [36.5 ??C (97.7 ??F)-37.1 ??C (98.8 ??F)]  36.6 ??C (97.9 ??F)  Heart Rate:  [66-89] 86  Resp:  [16-18] 17  BP: (116-119)/(67-82) 118/77  SpO2:  [98 %-100 %] 98 %    Gen: NAD  HENT: atraumatic, normocephalic  Heart: regular rate and rhythm   Lungs: normal work of breathing on room air   Abdomen: soft, non distended, LLQ tender to palpation   Extremities: no edema

## 2023-03-15 DIAGNOSIS — D48119 Desmoid tumor: Principal | ICD-10-CM

## 2023-03-15 LAB — CBC W/ AUTO DIFF
BASOPHILS ABSOLUTE COUNT: 0 10*9/L (ref 0.0–0.1)
BASOPHILS RELATIVE PERCENT: 0.2 %
EOSINOPHILS ABSOLUTE COUNT: 0 10*9/L (ref 0.0–0.5)
EOSINOPHILS RELATIVE PERCENT: 0 %
HEMATOCRIT: 38.6 % (ref 34.0–44.0)
HEMOGLOBIN: 13.3 g/dL (ref 11.3–14.9)
LYMPHOCYTES ABSOLUTE COUNT: 1.3 10*9/L (ref 1.1–3.6)
LYMPHOCYTES RELATIVE PERCENT: 24.6 %
MEAN CORPUSCULAR HEMOGLOBIN CONC: 34.4 g/dL (ref 32.0–36.0)
MEAN CORPUSCULAR HEMOGLOBIN: 27 pg (ref 25.9–32.4)
MEAN CORPUSCULAR VOLUME: 78.5 fL (ref 77.6–95.7)
MEAN PLATELET VOLUME: 10.2 fL (ref 6.8–10.7)
MONOCYTES ABSOLUTE COUNT: 0.1 10*9/L — ABNORMAL LOW (ref 0.3–0.8)
MONOCYTES RELATIVE PERCENT: 1.7 %
NEUTROPHILS ABSOLUTE COUNT: 3.7 10*9/L (ref 1.8–7.8)
NEUTROPHILS RELATIVE PERCENT: 73.5 %
PLATELET COUNT: 255 10*9/L (ref 150–450)
RED BLOOD CELL COUNT: 4.91 10*12/L (ref 3.95–5.13)
RED CELL DISTRIBUTION WIDTH: 15.1 % (ref 12.2–15.2)
WBC ADJUSTED: 5.1 10*9/L (ref 3.6–11.2)

## 2023-03-15 MED ADMIN — escitalopram oxalate (LEXAPRO) tablet 5 mg: 5 mg | ORAL | @ 15:00:00

## 2023-03-15 MED ADMIN — sodium chloride (NS) 0.9 % flush 10 mL: 10 mL | INTRAVENOUS | @ 02:00:00

## 2023-03-15 MED ADMIN — ferumoxytol (FERAHEME) 510 mg in sodium chloride (NS) 0.9 % 100 mL IVPB: 510 mg | INTRAVENOUS | @ 17:00:00 | Stop: 2023-03-15

## 2023-03-15 MED ADMIN — famotidine (PF) (PEPCID) injection 40 mg: 40 mg | INTRAVENOUS | @ 21:00:00 | Stop: 2023-03-15

## 2023-03-15 MED ADMIN — ondansetron (ZOFRAN) injection 8 mg: 8 mg | INTRAVENOUS | @ 11:00:00 | Stop: 2023-03-15

## 2023-03-15 MED ADMIN — acetaminophen (TYLENOL) tablet 1,000 mg: 1000 mg | ORAL | @ 19:00:00

## 2023-03-15 MED ADMIN — ondansetron (ZOFRAN) injection 8 mg: 8 mg | INTRAVENOUS | @ 01:00:00

## 2023-03-15 MED ADMIN — sodium chloride 0.9% (NS) bolus 1,000 mL: 1000 mL | INTRAVENOUS | @ 16:00:00 | Stop: 2023-03-15

## 2023-03-15 MED ADMIN — ondansetron (ZOFRAN-ODT) disintegrating tablet 8 mg: 8 mg | ORAL | @ 19:00:00

## 2023-03-15 MED ADMIN — cetirizine (ZYRTEC) tablet 20 mg: 20 mg | ORAL | @ 15:00:00 | Stop: 2023-03-19

## 2023-03-15 MED ADMIN — promethazine (PHENERGAN) 12.5 mg in sodium chloride (NS) 0.9 % 50 mL IVPB: 12.5 mg | INTRAVENOUS | @ 23:00:00

## 2023-03-15 MED ADMIN — sodium chloride (NS) 0.9 % infusion: 100 mL/h | INTRAVENOUS | @ 19:00:00 | Stop: 2023-03-15

## 2023-03-15 MED ADMIN — famotidine (PEPCID) tablet 20 mg: 20 mg | ORAL | @ 02:00:00

## 2023-03-15 MED ADMIN — nirogacestat (OGSIVEO) tablet 150 mg: 150 mg | ORAL | @ 01:00:00

## 2023-03-15 MED ADMIN — HYDROmorphone (PF) (DILAUDID) injection 1 mg: 1 mg | INTRAVENOUS | @ 03:00:00 | Stop: 2023-03-28

## 2023-03-15 MED ADMIN — famotidine (PF) (PEPCID) injection 40 mg: 40 mg | INTRAVENOUS | @ 16:00:00 | Stop: 2023-03-15

## 2023-03-15 MED ADMIN — acetaminophen (TYLENOL) tablet 1,000 mg: 1000 mg | ORAL | @ 11:00:00

## 2023-03-15 MED ADMIN — cetirizine (ZYRTEC) tablet 20 mg: 20 mg | ORAL | @ 01:00:00 | Stop: 2023-03-19

## 2023-03-15 MED ADMIN — famotidine (PF) (PEPCID) injection 40 mg: 40 mg | INTRAVENOUS | @ 18:00:00 | Stop: 2023-03-15

## 2023-03-15 MED ADMIN — cefdinir (OMNICEF) capsule 300 mg: 300 mg | ORAL | @ 15:00:00 | Stop: 2023-03-24

## 2023-03-15 MED ADMIN — diphenhydrAMINE (BENADRYL) injection: 50 mg | INTRAVENOUS | @ 17:00:00 | Stop: 2023-03-15

## 2023-03-15 MED ADMIN — diphenhydrAMINE (BENADRYL) injection: 50 mg | INTRAVENOUS | @ 20:00:00 | Stop: 2023-03-15

## 2023-03-15 MED ADMIN — hydrocortisone 2.5 % cream: TOPICAL | @ 16:00:00

## 2023-03-15 MED ADMIN — HYDROmorphone (PF) (DILAUDID) injection 1 mg: 1 mg | INTRAVENOUS | @ 11:00:00 | Stop: 2023-03-15

## 2023-03-15 MED ADMIN — oxyCODONE (ROXICODONE) 5 mg/5 mL solution 20 mg: 20 mg | ORAL | @ 21:00:00 | Stop: 2023-03-17

## 2023-03-15 MED ADMIN — diphenhydrAMINE (BENADRYL) injection: 50 mg | INTRAVENOUS | @ 19:00:00 | Stop: 2023-03-15

## 2023-03-15 MED ADMIN — promethazine (PHENERGAN) 12.5 mg in sodium chloride (NS) 0.9 % 50 mL IVPB: 12.5 mg | INTRAVENOUS | @ 05:00:00

## 2023-03-15 MED ADMIN — oxyCODONE (ROXICODONE) 5 mg/5 mL solution 20 mg: 20 mg | ORAL | @ 05:00:00 | Stop: 2023-03-17

## 2023-03-15 MED ADMIN — nirogacestat (OGSIVEO) tablet 150 mg: 150 mg | ORAL | @ 15:00:00

## 2023-03-15 MED ADMIN — acetaminophen (TYLENOL) tablet 1,000 mg: 1000 mg | ORAL | @ 02:00:00

## 2023-03-15 MED ADMIN — HYDROmorphone (PF) (DILAUDID) injection Syrg 0.5 mg: .5 mg | INTRAVENOUS | @ 18:00:00 | Stop: 2023-03-28

## 2023-03-15 MED ADMIN — predniSONE (DELTASONE) tablet 50 mg: 50 mg | ORAL | @ 02:00:00 | Stop: 2023-03-14

## 2023-03-15 MED ADMIN — methylPREDNISolone sodium succinate (PF) (SOLU-Medrol) injection 40 mg: 40 mg | INTRAVENOUS | @ 16:00:00 | Stop: 2023-03-15

## 2023-03-15 MED ADMIN — predniSONE (DELTASONE) tablet 50 mg: 50 mg | ORAL | @ 09:00:00 | Stop: 2023-03-15

## 2023-03-15 MED ADMIN — cefdinir (OMNICEF) capsule 300 mg: 300 mg | ORAL | @ 02:00:00 | Stop: 2023-03-24

## 2023-03-15 MED ADMIN — oxyCODONE (ROXICODONE) 5 mg/5 mL solution 20 mg: 20 mg | ORAL | @ 15:00:00 | Stop: 2023-03-17

## 2023-03-15 MED ADMIN — diphenhydrAMINE (BENADRYL) injection: 50 mg | INTRAVENOUS | @ 16:00:00 | Stop: 2023-03-15

## 2023-03-15 NOTE — Unmapped (Signed)
AOx4, VSS, remained afebrile and free from falls. Pt c/o nausea twice and PRN zofran and phenergan given. Pt c/o pain 4x throughout shift. PRN oxy given 2x and PRN dilaudid given 2x. Pt was able to tolerate some PO intake. Family at bedside. Will continue to monitor.     Problem: Adult Inpatient Plan of Care  Goal: Plan of Care Review  Outcome: Ongoing - Unchanged  Goal: Patient-Specific Goal (Individualized)  Outcome: Ongoing - Unchanged  Goal: Absence of Hospital-Acquired Illness or Injury  Outcome: Ongoing - Unchanged  Intervention: Identify and Manage Fall Risk  Recent Flowsheet Documentation  Taken 03/14/2023 0732 by Coralie Common, RN  Safety Interventions: lighting adjusted for tasks/safety  Intervention: Prevent Skin Injury  Recent Flowsheet Documentation  Taken 03/14/2023 0732 by Coralie Common, RN  Positioning for Skin: Supine/Back  Goal: Optimal Comfort and Wellbeing  Outcome: Ongoing - Unchanged  Goal: Readiness for Transition of Care  Outcome: Ongoing - Unchanged  Goal: Rounds/Family Conference  Outcome: Ongoing - Unchanged     Problem: Pain Acute  Goal: Optimal Pain Control and Function  Outcome: Ongoing - Unchanged     Problem: Latex Allergy  Goal: Absence of Allergy Symptoms  Outcome: Ongoing - Unchanged     Problem: Oncology Care  Goal: Effective Coping  Outcome: Ongoing - Unchanged  Goal: Improved Activity Tolerance  Outcome: Ongoing - Unchanged  Goal: Optimal Oral Intake  Outcome: Ongoing - Unchanged  Goal: Improved Oral Mucous Membrane Integrity  Outcome: Ongoing - Unchanged  Goal: Optimal Pain Control and Function  Outcome: Ongoing - Unchanged     Problem: Infection  Goal: Absence of Infection Signs and Symptoms  Outcome: Ongoing - Unchanged     Problem: Self-Care Deficit  Goal: Improved Ability to Complete Activities of Daily Living  Outcome: Ongoing - Unchanged     Problem: Malnutrition  Goal: Improved Nutritional Intake  Outcome: Ongoing - Unchanged     Problem: Fall Injury Risk  Goal: Absence of Fall and Fall-Related Injury  Outcome: Ongoing - Unchanged  Intervention: Promote Injury-Free Environment  Recent Flowsheet Documentation  Taken 03/14/2023 0732 by Coralie Common, RN  Safety Interventions: lighting adjusted for tasks/safety

## 2023-03-15 NOTE — Unmapped (Signed)
 VENOUS ACCESS ULTRASOUND PROCEDURE NOTE    Indications:   Poor venous access.    The Venous Access Team has assessed this patient for the placement of a PIV. Ultrasound guidance was necessary to obtain access.     Procedure Details:  Identity of the patient was confirmed via name, medical record number and date of birth. The availability of the correct equipment was verified.    The vein was identified for ultrasound catheter insertion.  Field was prepared with necessary supplies and equipment.  Probe cover and sterile gel utilized.  Insertion site was prepped with chlorhexidine solution and allowed to dry.  The catheter extension was primed with normal saline. A(n) 22 gauge 1.75 catheter was placed in the L Forearm with 1attempt(s). See LDA for additional details.    Catheter aspirated, 1 mL blood return present. The catheter was then flushed with 10 mL of normal saline. Insertion site cleansed, and dressing applied per manufacturer guidelines. The catheter was inserted with difficulty due to poor vasculature by Melodye Ped, RN.    Primary RN was notified.     Thank you,     Melodye Ped, RN Venous Access Team   [phone]     Workup / Procedure Time:  45 minutes    See images below:

## 2023-03-15 NOTE — Unmapped (Signed)
Oncology (MEDO) Progress Note    Assessment & Plan:   [name] [name] is a 23 y.o. female whose presentation is complicated by [name] c/b desmoid fibromatomsis, recurrent ileus, GI bleeding, complex pain, PTSD secondary to chronic medical condition and multiple healthcare interactions who presented to [facility] with refractory pain in setting of large tumor burden and diarrhea secondary to pouchitis.     Principal Problem:    Chronic pain due to neoplasm  Active Problems:    [name]    Generalized anxiety disorder with panic attacks    GI bleeding    MDD (major depressive disorder)    Dizziness    Fatigue    Active Problems    Acute on Chronic Generalized & Abdominal Pain - [name] Burden  Pain Related Nausea   The patient follows with [facility], most recently was seen in clinic in December 2024. It has been documented that IV opioids likely precipitate narcotic bowel syndrome and generates a positive feedback loop. Patient with multiple instances of ileus while hospitalized due to high requirement of opioids. She has been placed on an IV lidocaine drip and IV ketamine in the past for uncontrolled pain, and has been noted by her outpatient pain providers that this would be recommended for future inpatient treatments (see pain management note 02/18/23). She was evaluated by chronic pain and decided to avoid ketamine at this time as she only has one more session available before May 2025. Holding off on lidocaine infusion to see if increased oral opioids dose would be effective as is patient's preference.   - Evaluated by chronic pain team, appreciate recs   - Tylenol 1000 mg 3 times daily scheduled  - Oxycodone 20 mg oral solution q4h PRN, encouraged patient to take regularly  - Dilaudid 1 mg q6h PRN -> 0.5 mg q6h PRN on 1/5  - Butrans patch   - Lidocaine patch  - Anti-emetics:    - Zofran 8 mg q8h PO scheduled    - Phenergan 12.5 mg IVPB q8h PRN    Iron deficiency anemia - History of DVT Follows with [facility]. History of anaphylaxis to IV iron and will need pre and post medication during any treatments. Most recently received FeraHeme during August 2024 hospitalization with rash/itching but overall was able to complete infusion. Ferritin 37, Tsat 13, iron 29. Iron deficit ~500 mg. Plan for IV iron this admission with pre-medication.   - monitor Hgb daily   - SCDs in place   - Start cetirizine 20 mg BID on 03/11/23 PM for pre-treatment. Continue BID for 72 hours after IV iron infusion.   - Plan to give IV Feraheme (1/5 @ 1100)  - Medication exception obtained by pharmacy team  - Infuse 500 mg over 120 minutes (prior infusion of 1000 mg over 120 minutes led to mild reaction)  - Pre-medications:   - 13 hours prior (1/4 @ 2200):    - Prednisone 50 mg received    - 7 hours prior (1/5 @ 0400):   - Prednisone 50 mg received    - 30 minutes prior (1/5 @ 1030):  - IV solumedrol 40 mg     - IV benadryl 50 mg     - IV famotidine 40 mg     - IV NS 1L bolus   - during IV iron infusion:    - slow infusion rate as needed     - PRN IV benadryl 50 mg  Mild Pouchitis   Diarrhea - Hematochezia - Anastomotic ulcers   Dizziness - Fatigue - Malnutrition   Patient reports recent dizziness upon standing and generalized feeling of malaise. Notes 15-20 stools per day this week, increase from baseline of 10 stools per day. She reports she has lost 5lbs over the past week or so. Of note, she did have her corpak removed early December due to blockage. Was on TF regimen Osmolite 1.5 as tolerated, 50-75 mL/h. No objective evidence by imaging or endoscopic findings to suggest etiology of inability to tolerate PO intake. Follows with nutrition outpatient. Suspect patient is orthostatic 2/2 poor PO intake and increased stools which are bloody in nature. She underwent endoscopy of pouch 1/3 after which she had severe left lower quadrant abdominal pain bowel movements with particles and blood which she noted were unusual for her post bowel prep. She was evaluated by GI and exam was reassuring to manage symptomatically. KUB without concern for pneumoperitoneum. Having her baseline bowel movements on 1/4.   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 [name].  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  Was most recently seen on 12/17 at which time nirogacestat was continued.  Of note, outpatient MRI was scheduled for restaging of desmoid tumor which was partially completed today. Patient reports she was only able to tolerate the abdomen and pelvis. Her pain was too great to continue.  - Notified Dr. [name] of admission  - Nirogacestat while in hospital, patient supplied and verified by pharmacy (cannot be within 2 hours of pantoprazole or famotidine)   - Outpatient MRI scans obtained inpatient on 03/11/23    Hypercalcemia (resolved)   Total calcium of 10.8. Asymptomatic. iCal normal at 4.66.    PTSD - Generalized Anxiety Disorder - Major Depressive Disorder  PTSD secondary to complex medical condition and interactions with medical system. Follows with [facility].   - continue nightly Mirtazapine 7.5 mg   - Escitalopram 5 mg daily     Issues Impacting Complexity of Management:  -Parenteral controlled medications: IV Dilaudid    Daily Checklist:  Diet: Regular Diet  DVT PPx: SCDs  Electrolytes: Replete Potassium to >/= 3.6 and Magnesium to >/= 1.8  Code Status: Full Code  Dispo: Home pending improvement of pain     Team Contact Information:   Primary Team: Oncology (MEDO)  Primary Resident: Garnet Sierras, MD  Resident's Pager: [phone] (Oncology Intern - Alvester Morin)    Interval History:   Continues to have LLQ pain post endoscopy. Pain not worsened but not improved.     Had 8 bowel movements on 1/4, apple sauce consistency which is her baseline.     Discussed with her regarding scheduling oral zofran to allow for nausea to be under control as this has sometimes prevented her from taking the  oral oxycodone. She is in agreement.     She will try to ask for the oral oxycodone every 4 hours. Her mom will write the time on the white board so she knows exactly when she can get it next.     Objective:   Temp:  [36.6 ??C (97.9 ??F)-36.9 ??C (98.4 ??F)] 36.8 ??C (98.2 ??F)  Heart Rate:  [66-92] 79  Resp:  [16-18] 17  BP: (118-128)/(69-89) 118/69  SpO2:  [92 %-100 %] 95 %    Gen: NAD  HENT: atraumatic, normocephalic  Heart: regular rate and rhythm   Lungs: normal work of breathing on room air   Abdomen: soft, non distended, LLQ tender to palpation   Extremities: no edema

## 2023-03-15 NOTE — Unmapped (Signed)
L FA PIV infiltrated; new one placed by VAT via ultra sound. Dilaudid and Oxy given PRN for chronic pain; phenergan and zofran given for nausea. Family visiting at bedside. Iron infusion planned for today.     Problem: Adult Inpatient Plan of Care  Goal: Plan of Care Review  Outcome: Ongoing - Unchanged  Flowsheets (Taken 03/15/2023 0508)  Progress: no change  Plan of Care Reviewed With: patient  Goal: Patient-Specific Goal (Individualized)  Outcome: Ongoing - Unchanged  Goal: Absence of Hospital-Acquired Illness or Injury  Outcome: Ongoing - Unchanged  Intervention: Identify and Manage Fall Risk  Recent Flowsheet Documentation  Taken 03/15/2023 0330 by Althea Charon, RN  Safety Interventions:   lighting adjusted for tasks/safety   low bed   family at bedside  Taken 03/15/2023 0130 by Althea Charon, RN  Safety Interventions:   lighting adjusted for tasks/safety   low bed   family at bedside  Taken 03/14/2023 2300 by Althea Charon, RN  Safety Interventions:   lighting adjusted for tasks/safety   low bed   family at bedside  Taken 03/14/2023 2130 by Althea Charon, RN  Safety Interventions:   lighting adjusted for tasks/safety   low bed   family at bedside  Taken 03/14/2023 2045 by Althea Charon, RN  Safety Interventions:   lighting adjusted for tasks/safety   low bed   family at bedside  Intervention: Prevent Skin Injury  Recent Flowsheet Documentation  Taken 03/15/2023 0330 by Althea Charon, RN  Positioning for Skin: Supine/Back  Device Skin Pressure Protection: adhesive use limited  Skin Protection: adhesive use limited  Taken 03/15/2023 0130 by Althea Charon, RN  Positioning for Skin: Supine/Back  Device Skin Pressure Protection: adhesive use limited  Skin Protection: adhesive use limited  Taken 03/14/2023 2300 by Althea Charon, RN  Positioning for Skin: Sitting in Chair  Device Skin Pressure Protection: adhesive use limited  Skin Protection: adhesive use limited  Taken 03/14/2023 2130 by Althea Charon, RN  Positioning for Skin: Supine/Back  Device Skin Pressure Protection: adhesive use limited  Skin Protection: adhesive use limited  Taken 03/14/2023 2045 by Althea Charon, RN  Positioning for Skin: Supine/Back  Device Skin Pressure Protection: adhesive use limited  Skin Protection: adhesive use limited  Intervention: Prevent Infection  Recent Flowsheet Documentation  Taken 03/15/2023 0330 by Althea Charon, RN  Infection Prevention: cohorting utilized  Taken 03/15/2023 0130 by Althea Charon, RN  Infection Prevention: cohorting utilized  Taken 03/14/2023 2300 by Althea Charon, RN  Infection Prevention: cohorting utilized  Taken 03/14/2023 2130 by Althea Charon, RN  Infection Prevention: cohorting utilized  Taken 03/14/2023 2045 by Althea Charon, RN  Infection Prevention: cohorting utilized  Goal: Optimal Comfort and Wellbeing  Outcome: Ongoing - Unchanged  Goal: Readiness for Transition of Care  Outcome: Ongoing - Unchanged  Goal: Rounds/Family Conference  Outcome: Ongoing - Unchanged     Problem: Pain Acute  Goal: Optimal Pain Control and Function  Outcome: Ongoing - Unchanged     Problem: Latex Allergy  Goal: Absence of Allergy Symptoms  Outcome: Ongoing - Unchanged     Problem: Oncology Care  Goal: Effective Coping  Outcome: Ongoing - Unchanged  Goal: Improved Activity Tolerance  Outcome: Ongoing - Unchanged  Goal: Optimal Oral Intake  Outcome: Ongoing - Unchanged  Goal: Improved Oral Mucous Membrane Integrity  Outcome: Ongoing - Unchanged  Goal: Optimal Pain Control and Function  Outcome: Ongoing - Unchanged     Problem: Infection  Goal: Absence of Infection Signs and Symptoms  Outcome:  Ongoing - Unchanged  Intervention: Prevent or Manage Infection  Recent Flowsheet Documentation  Taken 03/15/2023 0330 by Althea Charon, RN  Infection Management: aseptic technique maintained  Taken 03/15/2023 0130 by Althea Charon, RN  Infection Management: aseptic technique maintained  Taken 03/14/2023 2300 by Althea Charon, RN  Infection Management: aseptic technique maintained  Taken 03/14/2023 2130 by Althea Charon, RN  Infection Management: aseptic technique maintained  Taken 03/14/2023 2045 by Althea Charon, RN  Infection Management: aseptic technique maintained     Problem: Self-Care Deficit  Goal: Improved Ability to Complete Activities of Daily Living  Outcome: Ongoing - Unchanged     Problem: Malnutrition  Goal: Improved Nutritional Intake  Outcome: Ongoing - Unchanged     Problem: Fall Injury Risk  Goal: Absence of Fall and Fall-Related Injury  Outcome: Ongoing - Unchanged  Intervention: Promote Injury-Free Environment  Recent Flowsheet Documentation  Taken 03/15/2023 0330 by Althea Charon, RN  Safety Interventions:   lighting adjusted for tasks/safety   low bed   family at bedside  Taken 03/15/2023 0130 by Althea Charon, RN  Safety Interventions:   lighting adjusted for tasks/safety   low bed   family at bedside  Taken 03/14/2023 2300 by Althea Charon, RN  Safety Interventions:   lighting adjusted for tasks/safety   low bed   family at bedside  Taken 03/14/2023 2130 by Althea Charon, RN  Safety Interventions:   lighting adjusted for tasks/safety   low bed   family at bedside  Taken 03/14/2023 2045 by Althea Charon, RN  Safety Interventions:   lighting adjusted for tasks/safety   low bed   family at bedside

## 2023-03-15 NOTE — Unmapped (Signed)
=  Department of Anesthesiology  Pain Medicine Division    Chronic Pain inpatient Consult follow up Note      Requesting Attending Physician:  [name] Blade, MD  Service Requesting Consult:  Oncology/Hematology (MDE)    Assessment/Recommendations:  [name] [name] is a 23 year old female with a complex medical history, including DVT (RUE), [name] syndrome (post-colectomy), desmoid tumors, anemia, and severe protein-calorie malnutrition. She is followed at [facility] for chronic abdominal and right shoulder pain, consistent with cancer-associated pain. 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 [name] Riverss pain has worsened in recent months, likely due to intermittent intestinal obstructions related to her mesenteric desmoid tumors, which may cause intermittent bowel intussusception. Her pain is further complicated by narcotic bowel syndrome, visceral hyperalgesia, and a positive feedback loop where increased opioid use exacerbates the pain. [name] Riverss most recent hospitalization was complicated by ileus, requiring NG tube, a prolonged inpatient rehab stay.     Recently, [name] [name] had been seen by Dr. Manson [name] in December 2024, who decided to switch her from Suboxone to [facility] 20 mcg/h patch and continue oxycodone 10 mg 3 times a day with flexibility to increase to 5 times a day for acute pain flares. However, over the past couple of weeks, she has experienced worsening abdominal and low back pain, along with an increase in stool frequency (up to 15 times a day with watery stools for 4 weeks) and some hematochezia.    On 03/08/23, she presented for an outpatient MRI but experienced a flare-up of her chronic pain after completing one of the three planned studies. She was admitted to the [facility] emergency department for intractable acute on chronic pain.  Upon evaluation, [name] [name] reported uncontrolled pain, rating her pain as 10/10. She noted that oral oxycodone 10 mg was ineffective, and the night team had not administered IV hydromorphone, which has helped her in the past. She felt she had fallen behind on pain control, leading to worsening symptoms today.    In previous hospitalizations, [name] [name] had received ketamine infusions (5/24, 6/6, 8/1) and a lidocaine infusion (09/28/2022). However, there was inconsistency in reporting the effectiveness of these treatments, making it difficult to assess their true benefit.     I contacted [name] [name]'s primary pain provider, Dr. Manson [name], to discuss her hospitalization and have been in contact since with updates, to aid [name] in treatment planning.    We have also advised [name] [name] to discuss with Dr. Manson [name] to create an ED/Admission care plan to streamline her care, upon her presentation to the hospital in the future. To which [name] [name] and her mother agreeable to.     Interval: per nurse no acute event. Per nurse patient has been sleeping this AM since her arrival to the shift. Patient was sleeping upon arrival in the room. Woke up to verbal stimuli.     Patient states same ongoing pain. Not worse. No new pain. No vomiting. Per nurse she ate food from cook out yesterday. Even though she later complain of some pain she didn't have vomiting. Per nurse some of her PO meds not being given probably because of complain of nausea. Per patient she has not utilized PO meds because different issues, not just nausea.     I again took time to explain to her and encouraged her to utilized more PO meds. We discussed about further wean on IV meds. We discussed about either keeping oxy PRN unchanged  or change to [facility] as below. Patient will decide and let primary team know.     Primary haem onc team resident in the room, agreeable with the plan.     She seems to be moving towards possible discharge in the coming days, so need to prioritize weaning IV.    Recommendations:  -The chronic pain service is a consult service and does not place orders, just makes recommendations (except ketamine and lidocaine infusions)  -Please evaluate all patients on opioids for appropriateness of prescribing narcan at discharge.  The chronic pain service can assist with this.  Nasal narcan covered by most insurance.  -Recommendations given apply to the current hospitalization and do not reflect long term recommendations.    Recommended to   -Continue Butrans patch 20 mcg/h q. Weekly    -wean IV hydromorphone further down to 0.5 mg q6hrs PRN With plan to wean further on Monday.  SECOND LINE, do NOT give unless patient has received a PO PRN within several hours    -continue oxycodone liquid 20 mg every 4 hours as needed or can switch to oxycodone liquid 15 mg q4hrs scheduled (per patient preference), FIRST LINE AGENT.  Will need to start weaning this as well, but need to prioritize IV weaning. If patient c/o nausea then utilize available PRN nausea meds. But please make all attempts to utilize PO first.     --Continue Tylenol 1 g 3 times daily    We will continue to follow.    Medications trialed during this admission (or pertinent in past): Hydromorphone IV, oxycodone oral, Butrans patch      Analgesia Evaluation:  Pain at minimum: 9/10  Pain at maximum: 9/10    Allergies    Allergies   Allergen Reactions    Adhesive Tape-Silicones Hives and Rash     Paper tape and Tegederm ok. Biopatch causes blistering but has tolerated CHG Tegaderm    Ferrlecit [Sodium Ferric Gluconat-Sucrose] Swelling and Rash    Levofloxacin Swelling and Rash     Swelling in mouth, rash,     Methylnaltrexone      Per Patient: I lost bowel control, severe abdominal cramping, and elevated BP    Neomycin Swelling     Rxn after ear drops; ear swelling    Papaya Hives    Morphine Nausea And Vomiting    Zosyn [Piperacillin-Tazobactam] Itching and Rash     Red and itchy    Compazine [Prochlorperazine] Other (See Comments)     Extreme agitation Iron Analogues     Iron Dextran Itching     Received iron dextran 06/08/22 over 12 hours, had itching and redness/flushing during the infusion and for a couple days after. Required IV benadryl w/flares between doses and ultimately treated w/IV methylpred for 2 days.     Latex, Natural Rubber Rash         Inpatient Medications  Current Facility-Administered Medications   Medication Dose Route Frequency Provider Last Rate Last Admin    acetaminophen (TYLENOL) tablet 1,000 mg  1,000 mg Oral Q8H SCH Grant Fontana, MD   1,000 mg at 03/15/23 1610    buprenorphine 20 mcg/hour transdermal patch 1 patch  1 patch Transdermal Q7 Days Grant Fontana, MD   1 patch at 03/11/23 1308    cefdinir (OMNICEF) capsule 300 mg  300 mg Oral Q12H [facility] Grant Fontana, MD   300 mg at 03/15/23 0957    cetirizine (ZYRTEC) tablet 20 mg  20 mg Oral BID  Garnet Sierras, MD   20 mg at 03/15/23 0957    CHEMO CLARIFICATION ORDER   Other Continuous PRN Grant Fontana, MD        diphenhydrAMINE (BENADRYL) injection  50 mg Intravenous Once PRN Garnet Sierras, MD        emollient combination no.92 (LUBRIDERM) lotion   Topical Q1H PRN Grant Fontana, MD   Given at 03/11/23 1447    escitalopram oxalate (LEXAPRO) tablet 5 mg  5 mg Oral Daily Grant Fontana, MD   5 mg at 03/15/23 0957    famotidine (PEPCID) tablet 20 mg  20 mg Oral Nightly Grant Fontana, MD   20 mg at 03/14/23 2117    ferumoxytol (FERAHEME) 510 mg in sodium chloride (NS) 0.9 % 100 mL IVPB  510 mg Intravenous Once Garnet Sierras, MD        fluticasone propionate (FLONASE) 50 mcg/actuation nasal spray 2 spray  2 spray Topical Q7 Days Grant Fontana, MD   2 spray at 03/09/23 0249    hydrocortisone 2.5 % cream   Topical BID PRN Grant Fontana, MD   Given at 03/15/23 1054    HYDROmorphone (PF) (DILAUDID) injection Syrg 0.5 mg  0.5 mg Intravenous Q6H PRN Garnet Sierras, MD        IP OKAY TO TREAT   Other Continuous PRN Grant Fontana, MD        lidocaine (ASPERCREME) 4 % 1 patch  1 patch Transdermal Daily Grant Fontana, MD   1 patch at 03/11/23 1250    LORazepam (ATIVAN) tablet 1 mg  1 mg Oral TID PRN Grant Fontana, MD        mirtazapine (REMERON) tablet 7.5 mg  7.5 mg Oral Nightly Grant Fontana, MD   7.5 mg at 03/10/23 2126    [Provider Hold] naloxegol (MOVANTIK) tablet 12.5 mg  12.5 mg Oral Daily Grant Fontana, MD   12.5 mg at 03/10/23 5784    naloxone [facility]) injection 0.4 mg  0.4 mg Intravenous Once PRN Grant Fontana, MD        nirogacestat (OGSIVEO) tablet 150 mg  150 mg Oral BID Grant Fontana, MD   150 mg at 03/15/23 1004    ondansetron (ZOFRAN-ODT) disintegrating tablet 8 mg  8 mg Oral Q8H SCH Gogerly-Moragoda, Ellene Route, MD        oxyCODONE (ROXICODONE) 5 mg/5 mL solution 20 mg  20 mg Oral Q4H PRN Garnet Sierras, MD   20 mg at 03/15/23 0957    pantoprazole (Protonix) EC tablet 40 mg  40 mg Oral Daily Grant Fontana, MD   40 mg at 03/12/23 1130    promethazine (PHENERGAN) 12.5 mg in sodium chloride (NS) 0.9 % 50 mL IVPB  12.5 mg Intravenous Q8H PRN Grant Fontana, MD   Stopped at 03/15/23 0004    saliva stimulant mucosal spray (BIOTENE)  1 spray Topical Q2H PRN Grant Fontana, MD   1 spray at 03/12/23 1309    scopolamine (TRANSDERM-SCOP) 1 mg over 3 days topical patch 1 mg  1 patch Topical Q72H Grant Fontana, MD   1 mg at 03/14/23 1316    sodium chloride (NS) 0.9 % flush 10 mL  10 mL Intravenous BID Grant Fontana, MD   10 mL at 03/14/23 2055    sodium chloride (NS) 0.9 % flush 10 mL  10 mL Intravenous BID Grant Fontana, MD  10 mL at 03/14/23 2056    sodium chloride (NS) 0.9 % flush 10 mL  10 mL Intravenous BID Grant Fontana, MD   10 mL at 03/14/23 2056    sodium chloride (NS) 0.9 % infusion  20 mL/hr Intravenous Continuous Grant Fontana, MD   Held at 03/09/23 0250    sodium chloride 0.9% (NS) bolus 1,000 mL  1,000 mL Intravenous Once Garnet Sierras, MD 1,000 mL/hr at 03/15/23 1047 1,000 mL at 03/15/23 1047    sucralfate (CARAFATE) oral suspension  1 g Oral QID Grant Fontana, MD   1 g at 03/11/23 1446    triamcinolone (KENALOG) 0.1 % ointment   Topical BID PRN Grant Fontana, MD   Given at 03/13/23 0051           Lab Results   Component Value Date    CREATININE 0.55 03/14/2023       Lab Results   Component Value Date    ALKPHOS 92 03/14/2023    BILITOT 0.2 (L) 03/14/2023    BILIDIR 0.10 10/29/2022    PROT 6.7 03/14/2023    ALBUMIN 3.6 03/14/2023    ALT 95 (H) 03/14/2023    AST 77 (H) 03/14/2023       Encounter Date: 03/08/23   ECG 12 Lead   Result Value    EKG Systolic BP     EKG Diastolic BP     EKG Ventricular Rate 82    EKG Atrial Rate 82    EKG P-R Interval 142    EKG QRS Duration 88    EKG Q-T Interval 382    EKG QTC Calculation 446    EKG Calculated P Axis 53    EKG Calculated R Axis 39    EKG Calculated T Axis 20    QTC Fredericia 424    Narrative    NORMAL SINUS RHYTHM  NONSPECIFIC T WAVE ABNORMALITY  ABNORMAL ECG  WHEN COMPARED WITH ECG OF 02-Nov-2022 08:33,  NONSPECIFIC T WAVE ABNORMALITY NOW EVIDENT IN ANTERIOR LEADS  Confirmed by Pollyann Kennedy ([id number]) on 03/09/2023 7:31:09 PM       No results found for requested labs within last 30 days.       Objective:     Vital Signs  Temp:  [36.6 ??C (97.9 ??F)-36.9 ??C (98.4 ??F)] 36.8 ??C (98.2 ??F)  Heart Rate:  [66-92] 79  Resp:  [16-18] 17  BP: (118-128)/(69-89) 118/69  MAP (mmHg):  [81-101] 85  SpO2:  [92 %-100 %] 95 %    Physical Exam    GENERAL:  Well developed, well-nourished female and is in no apparent distress.  Resting comfortably this AM  HEAD/NECK:    Reveals normocephalic/atraumatic.   CARDIOVASCULAR:   Regular rate  LUNGS:   Normal work of breathing, no supplemental 02  MUSCULOSKELETAL:    Moving all 4 extremities.    SKIN:  No obvious rashes lesions or erythema  PSY:  Appropriate affect and mood.      Problem List    Principal Problem:    Chronic pain due to neoplasm  Active Problems:    [name]    Generalized anxiety disorder with panic attacks    GI bleeding    MDD (major depressive disorder)    Dizziness    Fatigue       Medical Decision Making    Problems Addressed:  1 or more chronic illnesses with severe exacerbation, progression, or side effects of treatment (high) pain still  severe at times, up to 9/10    Amount/Complexity:  Reviewed external records:  oncology 03/15/23  Reviewed results: None  Ordered tests: None  Assessment requiring independent historian: None  Discussion of management/test results with medical professional: Primary team  Independent interpretation of test: None    Risk  High: Drug therapy requiring intensive monitoring for toxicity, Decision regarding hospitalization, Elective Major Surgery w/ risk factors, Emergency Major Surgery, Decision to DNR or de-escalate care because of poor prognosis, parental controlled substances- IV opioids      I was immediately available during all portions of the patient's visit and discussed the assessment and plan with the fellow.  I reviewed the chart and the fellow's note and agree with the findings and plan.  The fellow and I have spent significant time each day discussing her care, reviewing her chart, and at times, including conversation with her primary pain physician (Dr. Manson [name]).  I think we have made decent progress over this week as her work up has been reassuring and she has undergone treatment for her pouchitis.  Criss Rosales, MD

## 2023-03-16 LAB — CBC W/ AUTO DIFF
BASOPHILS ABSOLUTE COUNT: 0 10*9/L (ref 0.0–0.1)
BASOPHILS RELATIVE PERCENT: 0.3 %
EOSINOPHILS ABSOLUTE COUNT: 0 10*9/L (ref 0.0–0.5)
EOSINOPHILS RELATIVE PERCENT: 0.3 %
HEMATOCRIT: 39.2 % (ref 34.0–44.0)
HEMOGLOBIN: 12.8 g/dL (ref 11.3–14.9)
LYMPHOCYTES ABSOLUTE COUNT: 4.4 10*9/L — ABNORMAL HIGH (ref 1.1–3.6)
LYMPHOCYTES RELATIVE PERCENT: 28.1 %
MEAN CORPUSCULAR HEMOGLOBIN CONC: 32.7 g/dL (ref 32.0–36.0)
MEAN CORPUSCULAR HEMOGLOBIN: 26.1 pg (ref 25.9–32.4)
MEAN CORPUSCULAR VOLUME: 79.8 fL (ref 77.6–95.7)
MEAN PLATELET VOLUME: 10.4 fL (ref 6.8–10.7)
MONOCYTES ABSOLUTE COUNT: 1.2 10*9/L — ABNORMAL HIGH (ref 0.3–0.8)
MONOCYTES RELATIVE PERCENT: 7.7 %
NEUTROPHILS ABSOLUTE COUNT: 9.9 10*9/L — ABNORMAL HIGH (ref 1.8–7.8)
NEUTROPHILS RELATIVE PERCENT: 63.6 %
PLATELET COUNT: 240 10*9/L (ref 150–450)
RED BLOOD CELL COUNT: 4.92 10*12/L (ref 3.95–5.13)
RED CELL DISTRIBUTION WIDTH: 15.4 % — ABNORMAL HIGH (ref 12.2–15.2)
WBC ADJUSTED: 15.6 10*9/L — ABNORMAL HIGH (ref 3.6–11.2)

## 2023-03-16 MED ADMIN — promethazine (PHENERGAN) 12.5 mg in sodium chloride (NS) 0.9 % 50 mL IVPB: 12.5 mg | INTRAVENOUS | @ 09:00:00

## 2023-03-16 MED ADMIN — ondansetron (ZOFRAN-ODT) disintegrating tablet 8 mg: 8 mg | ORAL | @ 04:00:00

## 2023-03-16 MED ADMIN — diphenhydrAMINE (BENADRYL) injection: 50 mg | INTRAVENOUS

## 2023-03-16 MED ADMIN — acetaminophen (TYLENOL) tablet 1,000 mg: 1000 mg | ORAL | @ 12:00:00

## 2023-03-16 MED ADMIN — acetaminophen (TYLENOL) tablet 1,000 mg: 1000 mg | ORAL | @ 20:00:00

## 2023-03-16 MED ADMIN — oxyCODONE (ROXICODONE) 5 mg/5 mL solution 20 mg: 20 mg | ORAL | @ 02:00:00 | Stop: 2023-03-17

## 2023-03-16 MED ADMIN — oxyCODONE (ROXICODONE) 5 mg/5 mL solution 20 mg: 20 mg | ORAL | @ 16:00:00 | Stop: 2023-03-19

## 2023-03-16 MED ADMIN — ondansetron (ZOFRAN-ODT) disintegrating tablet 8 mg: 8 mg | ORAL | @ 20:00:00

## 2023-03-16 MED ADMIN — cetirizine (ZYRTEC) tablet 20 mg: 20 mg | ORAL | @ 02:00:00 | Stop: 2023-03-19

## 2023-03-16 MED ADMIN — cefdinir (OMNICEF) capsule 300 mg: 300 mg | ORAL | @ 04:00:00 | Stop: 2023-03-24

## 2023-03-16 MED ADMIN — ondansetron (ZOFRAN-ODT) disintegrating tablet 8 mg: 8 mg | ORAL | @ 12:00:00

## 2023-03-16 MED ADMIN — oxyCODONE (ROXICODONE) 5 mg/5 mL solution 20 mg: 20 mg | ORAL | Stop: 2023-03-19

## 2023-03-16 MED ADMIN — cetirizine (ZYRTEC) tablet 20 mg: 20 mg | ORAL | @ 15:00:00 | Stop: 2023-03-19

## 2023-03-16 MED ADMIN — escitalopram oxalate (LEXAPRO) tablet 5 mg: 5 mg | ORAL | @ 15:00:00

## 2023-03-16 MED ADMIN — diphenhydrAMINE (BENADRYL) injection: 25 mg | INTRAVENOUS | @ 20:00:00 | Stop: 2023-03-16

## 2023-03-16 MED ADMIN — famotidine (PF) (PEPCID) injection 40 mg: 40 mg | INTRAVENOUS | @ 18:00:00 | Stop: 2023-03-16

## 2023-03-16 MED ADMIN — oxyCODONE (ROXICODONE) 5 mg/5 mL solution 20 mg: 20 mg | ORAL | @ 12:00:00 | Stop: 2023-03-19

## 2023-03-16 MED ADMIN — HYDROmorphone (PF) (DILAUDID) injection Syrg 0.5 mg: .5 mg | INTRAVENOUS | @ 09:00:00 | Stop: 2023-03-16

## 2023-03-16 MED ADMIN — oxyCODONE (ROXICODONE) 5 mg/5 mL solution 20 mg: 20 mg | ORAL | @ 21:00:00 | Stop: 2023-03-19

## 2023-03-16 MED ADMIN — LORazepam (ATIVAN) tablet 1 mg: 1 mg | ORAL | @ 17:00:00

## 2023-03-16 MED ADMIN — cefdinir (OMNICEF) capsule 300 mg: 300 mg | ORAL | @ 15:00:00 | Stop: 2023-03-24

## 2023-03-16 MED ADMIN — promethazine (PHENERGAN) 12.5 mg in sodium chloride (NS) 0.9 % 50 mL IVPB: 12.5 mg | INTRAVENOUS | @ 23:00:00

## 2023-03-16 MED ADMIN — diphenhydrAMINE (BENADRYL) injection: 25 mg | INTRAVENOUS | @ 02:00:00 | Stop: 2023-03-16

## 2023-03-16 MED ADMIN — acetaminophen (TYLENOL) tablet 1,000 mg: 1000 mg | ORAL | @ 04:00:00

## 2023-03-16 MED ADMIN — famotidine (PEPCID) tablet 20 mg: 20 mg | ORAL | @ 04:00:00

## 2023-03-16 MED ADMIN — diphenhydrAMINE (BENADRYL) capsule/tablet 25 mg: 25 mg | ORAL | @ 20:00:00 | Stop: 2023-03-16

## 2023-03-16 MED ADMIN — sucralfate (CARAFATE) oral suspension: 1 g | ORAL | @ 02:00:00

## 2023-03-16 MED ADMIN — famotidine (PEPCID) tablet 40 mg: 40 mg | ORAL | @ 23:00:00 | Stop: 2023-03-18

## 2023-03-16 MED ADMIN — HYDROmorphone (PF) (DILAUDID) injection Syrg 0.5 mg: .5 mg | INTRAVENOUS | Stop: 2023-03-28

## 2023-03-16 MED ADMIN — nirogacestat (OGSIVEO) tablet 150 mg: 150 mg | ORAL | @ 02:00:00

## 2023-03-16 MED ADMIN — diphenhydrAMINE (BENADRYL) injection: 25 mg | INTRAVENOUS | @ 10:00:00 | Stop: 2023-03-16

## 2023-03-16 MED ADMIN — nirogacestat (OGSIVEO) tablet 150 mg: 150 mg | ORAL | @ 15:00:00

## 2023-03-16 MED ADMIN — diphenhydrAMINE (BENADRYL) injection: 25 mg | INTRAVENOUS | @ 15:00:00 | Stop: 2023-03-16

## 2023-03-16 MED ADMIN — naloxegol (MOVANTIK) tablet 12.5 mg: 12.5 mg | ORAL | @ 21:00:00

## 2023-03-16 NOTE — Unmapped (Addendum)
Multiple PRNs given for pain, nausea and itching. VSS. Ambulating with walker. Mom at bedside. Remains on tele and pulse ox. Rest promoted.     Problem: Adult Inpatient Plan of Care  Goal: Plan of Care Review  Outcome: Ongoing - Unchanged  Goal: Patient-Specific Goal (Individualized)  Outcome: Ongoing - Unchanged  Goal: Absence of Hospital-Acquired Illness or Injury  Outcome: Ongoing - Unchanged  Intervention: Identify and Manage Fall Risk  Recent Flowsheet Documentation  Taken 03/15/2023 1930 by Duane Boston, RN  Safety Interventions:   fall reduction program maintained   lighting adjusted for tasks/safety   low bed   room near unit station   nonskid shoes/slippers when out of bed  Intervention: Prevent Skin Injury  Recent Flowsheet Documentation  Taken 03/16/2023 0033 by Duane Boston, RN  Positioning for Skin: Supine/Back  Taken 03/15/2023 2215 by Duane Boston, RN  Positioning for Skin: Supine/Back  Taken 03/15/2023 1930 by Duane Boston, RN  Positioning for Skin: Supine/Back  Goal: Optimal Comfort and Wellbeing  Outcome: Ongoing - Unchanged  Goal: Readiness for Transition of Care  Outcome: Ongoing - Unchanged  Goal: Rounds/Family Conference  Outcome: Ongoing - Unchanged

## 2023-03-16 NOTE — Unmapped (Signed)
Luminal Gastroenterology Consult Service   Progress Note         Assessment and Recommendations:   [name] [name] is a 23 y.o. female with a PMHx of [name] ([name]) s/p proctocolectomy w/ileoanal anastomosis, desmoid tumors (currently on nirogacestat, previously on sorafenib), anemia, previously on TPN and prolonged tube feeds, chronic pain, PTSD who presented to [facility] with recurrent abdominal pain, hematochezia. The patient is seen in consultation at the request of Hewitt Blade, MD (Oncology/Hematology (MDE)) for gastrointestinal bleeding, with concern for recurrent pouchitis vs pouch ulceration.     Pouchitis  Pt reported increased bowel frequency up to 15-20 times daily with associated day time incontinence, urgency, and hematochezia. Hgb mildly decreased from baseline. This is reminiscent of previous episodes of pouchitis. Started cefdinir with improvement of symptoms. Pouchoscopy 1/3 revealed intact pouch with minimal inflammation and several superficial ulcerations at the pouch anastamosis and pre-pouch inlet, suggestive of healing pouchitis. Biopsies with moderately active chronic enteritis, no CMV, granuloma, or dysplasia (consistent with pouchitis) which was reviewed with patient and mother. Would continue antibiotics as outlined below and will arrange clinic follow-up. Rectal bleeding now resolved may be from internal hemorrhoids. Possible that abdominal pain is in part due to gaseous distention but would favor more chronic abdominal  pain etiology. Discussed that as her BMs become less frequent and more formed, it would be ok to resume the movantik in the next few days.  -- continue cefdinir 300mg  BID x4weeks, followed by daily x4 weeks  -- continue to monitor stool output and bleeding  -- ok to resume movantik in the next few days as patient wishes  -- appreciate pain management teams assistance with abdominal pain  -- please notify luminal team when patient discharges, we will arrange follow-up in gi clinic     Recurrent iron deficiency   Iron infusions     Hx of poor PO intake, improving   Pt has been working closely with nutrition as an outpatient. We commend her progress with focusing on PO intake and would not recommend any additional interventions at this time.    Recommendations discussed with the patient's primary team. We will sign-off at this time, please re-contact if additional questions or a new need for consultation arises.    History of Present Illness:   Continues to endorse abdominal pain worse in LLQ post procedure. States that stools are becoming more formed and less frequent though going 8-10 times a day (normal for her is 5-8BM), wondering about restarting movantik. No further hematochezia.    -I have reviewed the patient's prior records from [facility] as summarized in the HPI    Objective:   Temp:  [36.4 ??C (97.5 ??F)-36.9 ??C (98.4 ??F)] 36.8 ??C (98.2 ??F)  Heart Rate:  [54-77] 66  Resp:  [16-18] 16  BP: (105-137)/(57-79) 105/69  SpO2:  [97 %-100 %] 100 %    Gen: WDWN female in NAD, answers questions appropriately  Abdomen: Soft, mild distention, LLQ tenderness, no rebound/guarding  Extremities: No edema in the BLEs    Pertinent Labs/Studies:  -I have reviewed the patient's labs from 03/16/23 which show uptrending leukocytosis (15.6 <- 5.1), stable Hgb    KUB 1/4  Impression   Mild gaseous distention of pelvic small bowel, stable.     Pouchoscopy biopsy  A: Small bowel, pouch, biopsy  - Erosive moderately active chronic enteritis  - No CMV viral cytopathic effect, granuloma, or dysplasia identified

## 2023-03-16 NOTE — Unmapped (Signed)
VENOUS ACCESS ULTRASOUND PROCEDURE NOTE    Indications:   Poor venous access.    The Venous Access Team has assessed this patient for the placement of a PIV. Ultrasound guidance was necessary to obtain access.     Procedure Details:  Identity of the patient was confirmed via name, medical record number and date of birth. The availability of the correct equipment was verified.    The vein was identified for ultrasound catheter insertion.  Field was prepared with necessary supplies and equipment.  Probe cover and sterile gel utilized.  Insertion site was prepped with chlorhexidine solution and allowed to dry.  The catheter extension was primed with normal saline. A(n) 22 gauge 1.75 catheter was placed in the L Upper Arm with 1attempt(s). See LDA for additional details.    Catheter aspirated, 2 mL blood return present. The catheter was then flushed with 10 mL of normal saline. Insertion site cleansed, and dressing applied per manufacturer guidelines. The catheter was inserted without difficulty by Richardine Service, RN.    Primary RN was notified.     Thank you,     Richardine Service, RN Venous Access Team   [phone]     Workup / Procedure Time:  30 minutes    See images below:

## 2023-03-16 NOTE — Unmapped (Signed)
Adult Nutrition Progress Note    Visit Type: Follow-Up  Reason for Visit: PO Intake, Supplements (Nutrition)    NUTRITION INTERVENTIONS and RECOMMENDATION     Continue with current diet: House  Continue pediasure TID: adjusted plain vanilla without pre-biotics (sending auto-delivery)  Monitor electrolytes and replete as needed  Can consider pediatric multivitamin chewable BID    NUTRITION ASSESSMENT     Nausea is controlled with scheduled ondansetron. Scoplamine patch continued  Pt s/p feraheme infusion in setting Iron (13). Pt hx sensitive to different types iron. Possible pediatric multivitamin  Pt s/p endoscope upper GI and small bowel. Pt noted with resolving pouchitis. Pt continued abx treatment  PO intake record usually 50% meals. Pt not taking much of pediasure as getting wrong type which is usually intolerable  Current nutrition therapy is appropriate and progressing toward meeting nutritional needs at this time.   Patient would adjustment to correct oral supplement to better meet nutritional needs.      NUTRITIONALLY RELEVANT DATA     HPI & PMH:   '[name] [name] is a 23 y.o. female whose presentation is complicated by [name] c/b desmoid fibromatomsis, recurrent ileus, GI bleeeds, DVT, MDD, and GAD that presented to [facility] with refractory pain iso tumor burden.    Nutrition Progress: Pt reports her oral intake fluctuates depending on her pain level. Zofran is helping her nausea. Most of her foods are brought in from outside as she has specific foods she tolerates and prefers. She reports receiving pediasure with prebiotics and sometimes chocolate which is not tolerable to her. She reports tolerates just regular vanilla pediasure.       Nutrition History:   RD met with patient and patient's mother at bedside. Patient endorses recent weight loss. Patient has been trying to eat regularly, but has found it difficult to meet her needs at times. She does consume nutritional supplements at home and has been on both enteral nutrition and TPN over the past year. Patient would like to have nutritional supplements provided during admission.    Medications:  Nutritionally pertinent medications reviewed and evaluated for potential food and/or medication interactions.       Labs:   Nutritionally pertinent labs reviewed.   Lab Results   Component Value Date    NA 141 03/14/2023    K 4.1 03/14/2023    CL 106 03/14/2023    CO2 28.0 03/14/2023    BUN <5 (L) 03/14/2023    CREATININE 0.55 03/14/2023    GFR UNABLE TO CALCULATE GFR.BASED ON PATIENT AGE 23/09/2011    GLU 99 03/14/2023    CALCIUM 9.6 03/14/2023    ALBUMIN 3.6 03/14/2023    PHOS 3.9 03/14/2023         Nutritional Needs:   Daily Estimated Nutrient Needs:  Energy: 1027-2536 kcals 30-35 kcal/kg using last recorded weight, 52.2 kg (03/10/23 1158)]  Protein: 63-78 gm [1.2-1.5 gm/kg using last recorded weight, 52.2 kg (03/10/23 1158)]  Carbohydrate:   [no restriction]  Fluid: 6440-3474 mL [1 mL/kcal (maintenance)]    Anthropometric Data:  Height: 165.1 cm (5' 5)   Admission weight: 52.2 kg (115 lb)  Last recorded weight: 53 kg (116 lb 12.8 oz)  Date of last recorded weight: 03/15/23  IBW: 56.75 kg  BMI: Body mass index is 19.44 kg/m??.   Usual Body Weight:  120's  Weight Assessment: 20 lb weight loss in 3 months, 14.8% weight loss, clinically significant.   Wt Readings from Last 10 Encounters:   03/15/23 53  kg (116 lb 12.8 oz)   02/24/23 54.1 kg (119 lb 4.8 oz)   01/23/23 57.4 kg (126 lb 9.6 oz)   01/21/23 58 kg (127 lb 12.8 oz)   01/14/23 58 kg (127 lb 12.8 oz)   12/16/22 58.5 kg (129 lb)   12/16/22 58.5 kg (129 lb)   12/08/22 61.3 kg (135 lb 3.2 oz)   11/14/22 61 kg (134 lb 8 oz)   09/24/22 62.4 kg (137 lb 9.6 oz)     Malnutrition Assessment:  Malnutrition Assessment using AND/ASPEN or GLIM Clinical Characteristics:    Non-severe (Moderate) Protein-Calorie Malnutrition in the context of chronic illness (03/10/23 1204)  Energy Intake: < 75% of estimated energy requirement for > or equal to 1 month  Interpretation of Wt. Loss: > or equal to 7.5% x 3 month  Fat Loss: Clinical criterion not met  Muscle Loss: Clinical criterion not met  Malnutrition Score: 2            Nutrition Focused Physical Exam:  Nutrition Focused Physical Exam:  Fat Areas Examined  Orbital: Mild loss  Upper Arm: No loss  Thoracic: No loss      Muscle Areas Examined  Temple: No loss  Clavicle: Mild loss  Acromion: No loss  Scapular: No loss  Dorsal Hand: No loss  Patellar: No loss  Anterior Thigh: No loss  Posterior Calf: No loss              Nutrition Evaluation  Overall Impressions: No fat loss, No muscle loss (03/10/23 1204)  Nutrition Designation: Normal weight (BMI 18.50 - 24.99 kg/m2) (03/10/23 1204)     Care plan:  Completed    Current Nutrition:  Oral intake   Nutrition Orders            Nutrition Therapy Regular/House starting at 01/03 1115    Supplement Pediatric; Pediasure 1.5 (PED High Calorie); # of Products PER Serving: 3 Q PM starting at 12/31 1800          Nutritionally Pertinent Allergies, Intolerances, Sensitivities, and/or Cultural/Religious Restrictions:  include papaya    GOALS and EVALUATION     Patient to meet 75% or greater of nutritional needs via combination of meals, snacks, and/or oral supplements within hospital admission.  - Progressing    Motivation, Barriers, and Compliance:  Evaluation of motivation, barriers, and compliance completed. No concerns identified at this time.     Discharge Planning:   Monitor for potential discharge needs with multi-disciplinary team.     Follow-Up Parameters:   1-2 times per week (and more frequent as indicated)    Ed Blalock, MS, RD, CSO, LDN  Pager # [phone]

## 2023-03-16 NOTE — Unmapped (Addendum)
=  Department of Anesthesiology  Pain Medicine Division    Chronic Pain inpatient Consult follow up Note      Requesting Attending Physician:  Hewitt Blade, MD  Service Requesting Consult:  Oncology/Hematology (MDE)    Assessment/Recommendations:  [name] [name] is a 23 year old female with a complex medical history, including DVT (RUE), [name] syndrome (post-colectomy), desmoid tumors, anemia, and severe protein-calorie malnutrition. She is followed at [facility] for chronic abdominal and right shoulder pain, consistent with cancer-associated pain. 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 [name] Riverss pain has worsened in recent months, likely due to intermittent intestinal obstructions related to her mesenteric desmoid tumors, which may cause intermittent bowel intussusception. Her pain is further complicated by narcotic bowel syndrome, visceral hyperalgesia, and a positive feedback loop where increased opioid use exacerbates the pain. [name] Riverss most recent hospitalization was complicated by ileus, requiring NG tube, a prolonged inpatient rehab stay.     Recently, [name] [name] had been seen by [name] in December 2024, who decided to switch her from Suboxone to [facility] 20 mcg/h patch and continue oxycodone 10 mg 3 times a day with flexibility to increase to 5 times a day for acute pain flares. However, over the past couple of weeks, she has experienced worsening abdominal and low back pain, along with an increase in stool frequency (up to 15 times a day with watery stools for 4 weeks) and some hematochezia.    On 03/08/23, she presented for an outpatient MRI but experienced a flare-up of her chronic pain after completing one of the three planned studies. She was admitted to the [facility] emergency department for intractable acute on chronic pain.  Upon evaluation, [name] [name] reported uncontrolled pain, rating her pain as 10/10. She noted that oral oxycodone 10 mg was ineffective, and the night team had not administered IV hydromorphone, which has helped her in the past. She felt she had fallen behind on pain control, leading to worsening symptoms today.    In previous hospitalizations, [name] [name] had received ketamine infusions (5/24, 6/6, 8/1) and a lidocaine infusion (09/28/2022). However, there was inconsistency in reporting the effectiveness of these treatments, making it difficult to assess their true benefit.     We have also advised [name] [name] to discuss with [name] to create an ED/Admission care plan to streamline her care, upon her presentation to the hospital in the future. To which [name] [name] and her mother agreeable to.     Interval: no acute overnight events. Pain not well-controlled as she did not receive her PO pain medications after 2100 until about 0645 this morning. Pt states she was dealing with itching from the iron infusion which did not let her rest much. She is concerned about transitioning away from IV pain medications. She is trying to take the PO oxycodone but has not been reliably getting it when asked, and is unhappy that it takes 1-2 hours to receive this medication when requested.    Patient states same ongoing pain. We took time to explain to her and encouraged her to utilized more PO medication usage. As her acute treatment winds down and we work towards discharge home, she will need to be off IV opioid medications, and she understands this, but has a significant amount of anxiety about the timing of these medications being deescalated and the timing of her discharge before she is mentally ready. We will plan to recommend IV hydromorphone  change to one-time orders (if needed) from primary team and no longer have this medication scheduled as PRN.    CBC showed leukocytosis, which is likely 2/2 steroids she received over the last 2 days.    Recommendations:  -The chronic pain service is a consult service and does not place orders, just makes recommendations (except ketamine and lidocaine infusions)  -Please evaluate all patients on opioids for appropriateness of prescribing narcan at discharge.  The chronic pain service can assist with this.  Nasal narcan covered by most insurance.  -Recommendations given apply to the current hospitalization and do not reflect long term recommendations.    Recommended to   -Continue Butrans patch 20 mcg/h qWeekly  -continue oxycodone liquid 20 mg every 4 hours as needed first-line agent.  Will need to start weaning this as well, but need to prioritize IV weaning. If patient c/o nausea then utilize available PRN nausea meds. But please make all attempts to utilize PO first.   -wean IV hydromorphone to one time order and only if needed for severe breakthrough pain. This is second-line, do not give unless patient has received a PO oxycodone dose within several hours. Please attempt to evaluate the patient in-person to see if this is actually needed and to confirm that patient has been receiving other pain medications first.  -Continue Tylenol 1 g 3 times daily    We will continue to follow.    Medications trialed during this admission (or pertinent in past): Hydromorphone IV, oxycodone oral, Butrans patch    Analgesia Evaluation:  Pain at minimum: 7/10  Pain at maximum: 9/10    Allergies    Allergies   Allergen Reactions    Adhesive Tape-Silicones Hives and Rash     Paper tape and Tegederm ok. Biopatch causes blistering but has tolerated CHG Tegaderm    Ferrlecit [Sodium Ferric Gluconat-Sucrose] Swelling and Rash    Levofloxacin Swelling and Rash     Swelling in mouth, rash,     Methylnaltrexone      Per Patient: I lost bowel control, severe abdominal cramping, and elevated BP    Neomycin Swelling     Rxn after ear drops; ear swelling    Papaya Hives    Morphine Nausea And Vomiting    Zosyn [Piperacillin-Tazobactam] Itching and Rash     Red and itchy    Compazine [Prochlorperazine] Other (See Comments)     Extreme agitation    Iron Analogues     Reglan [Metoclopramide Hcl] Diarrhea    Iron Dextran Itching     Received iron dextran 06/08/22 over 12 hours, had itching and redness/flushing during the infusion and for a couple days after. Required IV benadryl w/flares between doses and ultimately treated w/IV methylpred for 2 days.     Latex, Natural Rubber Rash         Inpatient Medications  Current Facility-Administered Medications   Medication Dose Route Frequency Provider Last Rate Last Admin    acetaminophen (TYLENOL) tablet 1,000 mg  1,000 mg Oral Q8H SCH Grant Fontana, MD   1,000 mg at 03/16/23 0641    buprenorphine 20 mcg/hour transdermal patch 1 patch  1 patch Transdermal Q7 Days Grant Fontana, MD   1 patch at 03/11/23 1308    cefdinir (OMNICEF) capsule 300 mg  300 mg Oral Q12H [facility] Grant Fontana, MD   300 mg at 03/16/23 6237    cetirizine (ZYRTEC) tablet 20 mg  20 mg Oral BID Garnet Sierras, MD  20 mg at 03/16/23 6045    CHEMO CLARIFICATION ORDER   Other Continuous PRN Grant Fontana, MD        diphenhydrAMINE (BENADRYL) injection  25 mg Intravenous Q4H PRN Garnet Sierras, MD   25 mg at 03/16/23 4098    emollient combination no.92 (LUBRIDERM) lotion   Topical Q1H PRN Grant Fontana, MD   Given at 03/11/23 1447    escitalopram oxalate (LEXAPRO) tablet 5 mg  5 mg Oral Daily Grant Fontana, MD   5 mg at 03/16/23 1191    famotidine (PEPCID) tablet 20 mg  20 mg Oral Nightly Grant Fontana, MD   20 mg at 03/15/23 2241    famotidine (PF) (PEPCID) injection 40 mg  40 mg Intravenous Q3H PRN Garnet Sierras, MD        fluticasone propionate (FLONASE) 50 mcg/actuation nasal spray 2 spray  2 spray Topical Q7 Days Grant Fontana, MD   2 spray at 03/09/23 0249    hydrocortisone 2.5 % cream   Topical BID PRN Grant Fontana, MD   Given at 03/15/23 1054    HYDROmorphone (PF) (DILAUDID) injection Syrg 0.5 mg 0.5 mg Intravenous Q6H PRN Garnet Sierras, MD   0.5 mg at 03/16/23 0411    IP OKAY TO TREAT   Other Continuous PRN Grant Fontana, MD        LORazepam (ATIVAN) tablet 1 mg  1 mg Oral TID PRN Grant Fontana, MD        [Provider Hold] naloxegol (MOVANTIK) tablet 12.5 mg  12.5 mg Oral Daily Grant Fontana, MD   12.5 mg at 03/10/23 4782    naloxone [facility]) injection 0.4 mg  0.4 mg Intravenous Once PRN Grant Fontana, MD        nirogacestat (OGSIVEO) tablet 150 mg  150 mg Oral BID Grant Fontana, MD   150 mg at 03/16/23 0935    ondansetron (ZOFRAN-ODT) disintegrating tablet 8 mg  8 mg Oral Q8H SCH Gogerly-Moragoda, Ellene Route, MD   8 mg at 03/16/23 0641    oxyCODONE (ROXICODONE) 5 mg/5 mL solution 20 mg  20 mg Oral Q4H PRN Garnet Sierras, MD   20 mg at 03/16/23 1122    pantoprazole (Protonix) EC tablet 40 mg  40 mg Oral Daily Grant Fontana, MD   40 mg at 03/12/23 1130    promethazine (PHENERGAN) 12.5 mg in sodium chloride (NS) 0.9 % 50 mL IVPB  12.5 mg Intravenous Q8H PRN Grant Fontana, MD   Stopped at 03/16/23 0431    saliva stimulant mucosal spray (BIOTENE)  1 spray Topical Q2H PRN Grant Fontana, MD   1 spray at 03/12/23 1309    scopolamine (TRANSDERM-SCOP) 1 mg over 3 days topical patch 1 mg  1 patch Topical Q72H Grant Fontana, MD   1 mg at 03/14/23 1316    sodium chloride (NS) 0.9 % flush 10 mL  10 mL Intravenous BID Grant Fontana, MD   10 mL at 03/14/23 2055    sodium chloride (NS) 0.9 % flush 10 mL  10 mL Intravenous BID Grant Fontana, MD   10 mL at 03/14/23 2056    sodium chloride (NS) 0.9 % flush 10 mL  10 mL Intravenous BID Grant Fontana, MD   10 mL at 03/14/23 2056    sodium chloride (NS) 0.9 % infusion  20 mL/hr Intravenous Continuous Grant Fontana, MD  Held at 03/09/23 0250    triamcinolone (KENALOG) 0.1 % ointment   Topical BID PRN Grant Fontana, MD   Given at 03/13/23 0051     Lab Results   Component Value Date    CREATININE 0.55 03/14/2023       Lab Results   Component Value Date    ALKPHOS 92 03/14/2023    BILITOT 0.2 (L) 03/14/2023    BILIDIR 0.10 10/29/2022    PROT 6.7 03/14/2023    ALBUMIN 3.6 03/14/2023    ALT 95 (H) 03/14/2023    AST 77 (H) 03/14/2023       Encounter Date: 03/08/23   ECG 12 Lead   Result Value    EKG Systolic BP     EKG Diastolic BP     EKG Ventricular Rate 72    EKG Atrial Rate 72    EKG P-R Interval 134    EKG QRS Duration 86    EKG Q-T Interval 418    EKG QTC Calculation 457    EKG Calculated P Axis 49    EKG Calculated R Axis 16    EKG Calculated T Axis 35    QTC Fredericia 444    Narrative    NORMAL SINUS RHYTHM  NORMAL ECG  WHEN COMPARED WITH ECG OF 08-Mar-2023 23:46,  NO SIGNIFICANT CHANGE WAS FOUND  Confirmed by Schuyler Amor ([id number]) on 03/15/2023 2:59:17 PM       No results found for requested labs within last 30 days.       Objective:     Vital Signs  Temp:  [36.4 ??C (97.5 ??F)-36.9 ??C (98.4 ??F)] 36.8 ??C (98.2 ??F)  Heart Rate:  [54-70] 66  Resp:  [16-18] 16  BP: (105-137)/(57-79) 105/69  MAP (mmHg):  [72-95] 78  SpO2:  [97 %-100 %] 100 %    Physical Exam    GENERAL:  Well developed, well-nourished female and is in no apparent distress. Resting comfortably in bed.  HEAD/NECK:    Reveals normocephalic/atraumatic.   CARDIOVASCULAR:   Regular rate  LUNGS:   Normal work of breathing, no supplemental 02  MUSCULOSKELETAL:    Moving all 4 extremities.    SKIN:  No obvious rashes lesions or erythema  PSY:  Appropriate affect and mood.    Problem List    Principal Problem:    Chronic pain due to neoplasm  Active Problems:    [name]    Generalized anxiety disorder with panic attacks    GI bleeding    MDD (major depressive disorder)    Dizziness    Fatigue       I was immediately available via phone/pager and present on site.  I reviewed the note after discussing the case with the fellow, but did not see the patient.  I agree with the assessment and plan as documented in the fellow's note.     Nat Christen., MD  Assistant Professor of Anesthesiology  Department of Anesthesiology   Divisions of General Anesthesiology and Pain Medicine    Medical Decision Making    Problems Addressed:  1 or more chronic illnesses with severe exacerbation, progression, or side effects of treatment (high)    Amount/Complexity:  Reviewed external records: Primary team progress note, GI progress note  Reviewed results: 1/6 CBC, 1/5 ECG, 1/4 CMP  Ordered tests: none  Assessment requiring independent historian: none  Discussion of management/test results with medical professional: Primary team  Independent interpretation of test: none  Risk  High: Drug therapy requiring intensive monitoring for toxicity, Decision regarding hospitalization, Elective Major Surgery w/ risk factors, Emergency Major Surgery, Decision to DNR or de-escalate care because of poor prognosis, parental controlled substances (IV opioids)

## 2023-03-16 NOTE — Unmapped (Signed)
 Pt is admitted into the hospital

## 2023-03-17 LAB — BASIC METABOLIC PANEL
ANION GAP: 8 mmol/L (ref 5–14)
BLOOD UREA NITROGEN: 10 mg/dL (ref 9–23)
BUN / CREAT RATIO: 18
CALCIUM: 9.2 mg/dL (ref 8.7–10.4)
CHLORIDE: 107 mmol/L (ref 98–107)
CO2: 29 mmol/L (ref 20.0–31.0)
CREATININE: 0.56 mg/dL (ref 0.55–1.02)
EGFR CKD-EPI (2021) FEMALE: >90 mL/min/{1.73_m2} (ref >=60)
GLUCOSE RANDOM: 91 mg/dL (ref 70–179)
POTASSIUM: 3.5 mmol/L (ref 3.4–4.8)
SODIUM: 144 mmol/L (ref 135–145)

## 2023-03-17 LAB — HEPATIC FUNCTION PANEL
ALBUMIN: 3.2 g/dL — ABNORMAL LOW (ref 3.4–5.0)
ALKALINE PHOSPHATASE: 66 U/L (ref 46–116)
ALT (SGPT): 25 U/L (ref 10–49)
AST (SGOT): 12 U/L (ref <=34)
BILIRUBIN DIRECT: <0.1 mg/dL (ref 0.00–0.30)
BILIRUBIN TOTAL: 0.2 mg/dL — ABNORMAL LOW (ref 0.3–1.2)
PROTEIN TOTAL: 5.8 g/dL (ref 5.7–8.2)

## 2023-03-17 LAB — CBC W/ AUTO DIFF
BASOPHILS ABSOLUTE COUNT: 0 10*9/L (ref 0.0–0.1)
BASOPHILS RELATIVE PERCENT: 0.5 %
EOSINOPHILS ABSOLUTE COUNT: 0.3 10*9/L (ref 0.0–0.5)
EOSINOPHILS RELATIVE PERCENT: 3.2 %
HEMATOCRIT: 34.4 % (ref 34.0–44.0)
HEMOGLOBIN: 11.3 g/dL (ref 11.3–14.9)
LYMPHOCYTES ABSOLUTE COUNT: 3.4 10*9/L (ref 1.1–3.6)
LYMPHOCYTES RELATIVE PERCENT: 40.5 %
MEAN CORPUSCULAR HEMOGLOBIN CONC: 32.9 g/dL (ref 32.0–36.0)
MEAN CORPUSCULAR HEMOGLOBIN: 26.2 pg (ref 25.9–32.4)
MEAN CORPUSCULAR VOLUME: 79.8 fL (ref 77.6–95.7)
MEAN PLATELET VOLUME: 9.9 fL (ref 6.8–10.7)
MONOCYTES ABSOLUTE COUNT: 0.7 10*9/L (ref 0.3–0.8)
MONOCYTES RELATIVE PERCENT: 8.8 %
NEUTROPHILS ABSOLUTE COUNT: 4 10*9/L (ref 1.8–7.8)
NEUTROPHILS RELATIVE PERCENT: 47 %
PLATELET COUNT: 200 10*9/L (ref 150–450)
RED BLOOD CELL COUNT: 4.3 10*12/L (ref 3.95–5.13)
RED CELL DISTRIBUTION WIDTH: 15.5 % — ABNORMAL HIGH (ref 12.2–15.2)
WBC ADJUSTED: 8.5 10*9/L (ref 3.6–11.2)

## 2023-03-17 LAB — VITAMIN D 25 HYDROXY: VITAMIN D, TOTAL (25OH): 25.4 ng/mL (ref 20.0–80.0)

## 2023-03-17 MED ADMIN — oxyCODONE (ROXICODONE) 5 mg/5 mL solution 20 mg: 20 mg | ORAL | @ 15:00:00 | Stop: 2023-03-21

## 2023-03-17 MED ADMIN — cefdinir (OMNICEF) capsule 300 mg: 300 mg | ORAL | @ 16:00:00 | Stop: 2023-03-24

## 2023-03-17 MED ADMIN — diphenhydrAMINE (BENADRYL) injection: 50 mg | INTRAVENOUS | @ 07:00:00 | Stop: 2023-03-17

## 2023-03-17 MED ADMIN — ondansetron (ZOFRAN-ODT) disintegrating tablet 8 mg: 8 mg | ORAL | @ 19:00:00

## 2023-03-17 MED ADMIN — naloxegol (MOVANTIK) tablet 12.5 mg: 12.5 mg | ORAL | @ 16:00:00

## 2023-03-17 MED ADMIN — acetaminophen (TYLENOL) tablet 1,000 mg: 1000 mg | ORAL | @ 12:00:00

## 2023-03-17 MED ADMIN — oxyCODONE (ROXICODONE) 5 mg/5 mL solution 20 mg: 20 mg | ORAL | @ 04:00:00 | Stop: 2023-03-19

## 2023-03-17 MED ADMIN — nirogacestat (OGSIVEO) tablet 150 mg: 150 mg | ORAL | @ 16:00:00

## 2023-03-17 MED ADMIN — diphenhydrAMINE (BENADRYL) injection: 50 mg | INTRAVENOUS

## 2023-03-17 MED ADMIN — oxyCODONE (ROXICODONE) 5 mg/5 mL solution 20 mg: 20 mg | ORAL | @ 22:00:00 | Stop: 2023-03-21

## 2023-03-17 MED ADMIN — ondansetron (ZOFRAN-ODT) disintegrating tablet 8 mg: 8 mg | ORAL | @ 12:00:00

## 2023-03-17 MED ADMIN — cetirizine (ZYRTEC) tablet 20 mg: 20 mg | ORAL | @ 16:00:00 | Stop: 2023-03-19

## 2023-03-17 MED ADMIN — nirogacestat (OGSIVEO) tablet 150 mg: 150 mg | ORAL | @ 02:00:00

## 2023-03-17 MED ADMIN — HYDROmorphone (PF) (DILAUDID) injection 1 mg: 1 mg | INTRAVENOUS | @ 20:00:00 | Stop: 2023-03-18

## 2023-03-17 MED ADMIN — sodium chloride (NS) 0.9 % flush 10 mL: 10 mL | INTRAVENOUS | @ 16:00:00

## 2023-03-17 MED ADMIN — pantoprazole (Protonix) EC tablet 40 mg: 40 mg | ORAL | @ 19:00:00

## 2023-03-17 MED ADMIN — escitalopram oxalate (LEXAPRO) tablet 5 mg: 5 mg | ORAL | @ 16:00:00

## 2023-03-17 MED ADMIN — diphenhydrAMINE (BENADRYL) injection: 50 mg | INTRAVENOUS | @ 19:00:00 | Stop: 2023-03-17

## 2023-03-17 MED ADMIN — acetaminophen (TYLENOL) tablet 1,000 mg: 1000 mg | ORAL | @ 04:00:00

## 2023-03-17 MED ADMIN — cetirizine (ZYRTEC) tablet 20 mg: 20 mg | ORAL | @ 02:00:00 | Stop: 2023-03-19

## 2023-03-17 MED ADMIN — cefdinir (OMNICEF) capsule 300 mg: 300 mg | ORAL | @ 04:00:00 | Stop: 2023-03-24

## 2023-03-17 MED ADMIN — acetaminophen (TYLENOL) tablet 1,000 mg: 1000 mg | ORAL | @ 19:00:00

## 2023-03-17 MED ADMIN — scopolamine (TRANSDERM-SCOP) 1 mg over 3 days topical patch 1 mg: 1 | TOPICAL | @ 20:00:00

## 2023-03-17 MED ADMIN — ondansetron (ZOFRAN-ODT) disintegrating tablet 8 mg: 8 mg | ORAL | @ 04:00:00

## 2023-03-17 MED ADMIN — promethazine (PHENERGAN) 12.5 mg in sodium chloride (NS) 0.9 % 50 mL IVPB: 12.5 mg | INTRAVENOUS | @ 07:00:00 | Stop: 2023-03-17

## 2023-03-17 MED ADMIN — famotidine (PEPCID) tablet 20 mg: 20 mg | ORAL | @ 04:00:00

## 2023-03-17 MED ADMIN — predniSONE (DELTASONE) tablet 40 mg: 40 mg | ORAL | @ 20:00:00 | Stop: 2023-03-20

## 2023-03-17 NOTE — Unmapped (Signed)
Saw Hellen on behalf of the Young Adult cancer program. Dezmariah shared some of her questions around her pain management plan as well as her struggle with anxiety during this inpatient stay. Following our conversation, I did the following:    - Reached out to Dr. Arlana Pouch, clinical psychologist with the inpatient CL psychiatry team. Kashawna was in agreement that seeing her during this stay would be helpful.  - Requested that MDO team put in a formal inpatient referral for Dr. Nehemiah Settle to see [name].  - Messaged with her pain management team (her outpatient pain management physician is currently the inpatient attending).  - Touched base with Dr. Theone Stanley, her outpatient oncologist, regarding her case.    Mariana Kaufman, AGPCNP-BC  Nurse Practitioner  Adoles[facility] and [facility] Cancer Program  [facility]  03/17/2023

## 2023-03-17 NOTE — Unmapped (Signed)
Oncology (MEDO) Progress Note    Assessment & Plan:   [name] [name] is a 23 y.o. female whose presentation is complicated by [name] c/b desmoid fibromatomsis, recurrent ileus, GI bleeding, complex pain, PTSD secondary to chronic medical condition and multiple healthcare interactions who presented to [facility] with refractory pain in setting of large tumor burden and diarrhea secondary to pouchitis.     Principal Problem:    Chronic pain due to neoplasm  Active Problems:    [name]    Generalized anxiety disorder with panic attacks    GI bleeding    MDD (major depressive disorder)    Dizziness    Fatigue    Active Problems    Acute on Chronic Generalized & Abdominal Pain - [name] Burden  Pain Related Nausea   The patient follows with [facility], most recently was seen in clinic in December 2024. It has been documented that IV opioids likely precipitate narcotic bowel syndrome and generates a positive feedback loop. Patient with multiple instances of ileus while hospitalized due to high requirement of opioids. She has been placed on an IV lidocaine drip and IV ketamine in the past for uncontrolled pain, and has been noted by her outpatient pain providers that this would be recommended for future inpatient treatments (see pain management note 02/18/23). She was evaluated by chronic pain and decided to avoid ketamine at this time as she only has one more session available before May 2025. Holding off on lidocaine infusion to see if increased oral opioids dose would be effective as is patient's preference.   - Evaluated by chronic pain team, appreciate recs   - Tylenol 1000 mg 3 times daily scheduled  - Oxycodone 20 mg oral solution q4h PRN, encouraged patient to take regularly  - Dilaudid 0.5 mg once in 24 hours if needed for severe pain, one time dose   - Butrans patch   - Lidocaine patch  - Anti-emetics:    - Zofran 8 mg q8h PO scheduled    - Phenergan 12.5 mg IVPB q8h PRN, discussed our goal to switch to oral 1/7    Iron deficiency anemia - History of DVT   Follows with [facility]. History of anaphylaxis to IV iron and will need pre and post medication during any treatments. Most recently received FeraHeme during August 2024 hospitalization with rash/itching but overall was able to complete infusion. Ferritin 37, Tsat 13, iron 29. Iron deficit ~500 mg. Plan for IV iron this admission with pre-medication. Received IV Feraheme on 1/5. Pre-medicated with cetirizine, steroids, benadryl, famotidine as outlined in Allergy note from July 2024. She developed urticaria approximately 30 minutes into infusion but was able to complete the infusion with slowing the rate and further doses of IV benadryl and IV famotidine.   - monitor Hgb daily   - Continue BID cetirizine for 72 hours after IV iron infusion.   - Transitioned from IV to oral benadryl 50 q4h PRN, oral famotidine 40 q4h PRN     Mild Pouchitis   Diarrhea - Hematochezia - Anastomotic ulcers   Dizziness - Fatigue - Malnutrition   Patient reports recent dizziness upon standing and generalized feeling of malaise. Notes 15-20 stools per day this week, increase from baseline of 10 stools per day. She reports she has lost 5lbs over the past week or so. Of note, she did have her corpak removed early December due to blockage. Was on TF regimen Osmolite 1.5 as tolerated, 50-75 mL/h. No objective  evidence by imaging or endoscopic findings to suggest etiology of inability to tolerate PO intake. Follows with nutrition outpatient. Suspect patient is orthostatic 2/2 poor PO intake and increased stools which are bloody in nature. She underwent endoscopy of pouch 1/3 after which she had severe left lower quadrant abdominal pain bowel movements with particles and blood which she noted were unusual for her post bowel prep. She was evaluated by GI and exam was reassuring to manage symptomatically. KUB without concern for pneumoperitoneum. Having her baseline bowel movements on 1/4.   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 [name].  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  Was most recently seen on 12/17 at which time nirogacestat was continued.  Of note, outpatient MRI was scheduled for restaging of desmoid tumor which was partially completed today. Patient reports she was only able to tolerate the abdomen and pelvis. Her pain was too great to continue.  - Notified Dr. [name] of admission  - Nirogacestat while in hospital, patient supplied and verified by pharmacy (cannot be within 2 hours of pantoprazole or famotidine)   - Outpatient MRI scans obtained inpatient on 03/11/23    Hypercalcemia (resolved)   Total calcium of 10.8. Asymptomatic. iCal normal at 4.66.    PTSD - Generalized Anxiety Disorder - Major Depressive Disorder  PTSD secondary to complex medical condition and interactions with medical system. Follows with [facility].   - continue nightly Mirtazapine 7.5 mg   - Escitalopram 5 mg daily     Issues Impacting Complexity of Management:  -Parenteral controlled medications: IV Dilaudid    Daily Checklist:  Diet: Regular Diet  DVT PPx: SCDs  Electrolytes: Replete Potassium to >/= 3.6 and Magnesium to >/= 1.8  Code Status: Full Code  Dispo: Home pending improvement of pain     Team Contact Information:   Primary Team: Oncology (MEDO)  Primary Resident: Garnet Sierras, MD  Resident's Pager: [phone] (Oncology Intern - Alvester Morin)    Interval History:   Continues to have LLQ pain post endoscopy, not worsened but not significantly improved either.   Feels more distended last 2 days.   Still feeling itchy/rash after yesterday's IV iron infusion.   She noted that she had used her call button 2-3 times to ask for pain meds but did not hear back, however RN had never been notified. This was during the iron infusion where she required frequent PRNs for allergic reaction.     Had a long discussion regarding her IV medications and our preference to begin weaning off IV medications as soon as possible to ensure we have a sustainable regimen in place as we look towards discharge, now that the root cause of her admission (pouchitis causing diarrhea and poor PO intake which likely precipitated this pain flare) has been addressed and is being  treated with antibiotics. She shared that the IV medications work better for her than the PO meds. We discussed that the IV medications would noticeably have a faster onset however the PO meds would ensure she has longer coverage so we isn't in excruciating pain / itchy when her next dose is due.     Objective:   Temp:  [36.4 ??C (97.5 ??F)-36.9 ??C (98.4 ??F)] 36.6 ??C (97.9 ??F)  Heart Rate:  [54-70] 64  Resp:  [16-18] 16  BP: (105-139)/(57-79) 139/75  SpO2:  [97 %-100 %] 100 %    Gen: NAD  HENT: atraumatic, normocephalic  Heart: regular rate and rhythm   Lungs: normal work of breathing on room air   Abdomen: soft, mildly distended, LLQ tender to palpation   Extremities: no edema

## 2023-03-17 NOTE — Unmapped (Signed)
Problem: Adult Inpatient Plan of Care  Goal: Plan of Care Review  Outcome: Progressing  Goal: Patient-Specific Goal (Individualized)  Outcome: Progressing  Goal: Absence of Hospital-Acquired Illness or Injury  Outcome: Progressing  Intervention: Identify and Manage Fall Risk  Recent Flowsheet Documentation  Taken 03/16/2023 1915 by Duane Boston, RN  Safety Interventions:   fall reduction program maintained   lighting adjusted for tasks/safety   low bed   nonskid shoes/slippers when out of bed   room near unit station  Intervention: Prevent Skin Injury  Recent Flowsheet Documentation  Taken 03/17/2023 0157 by Duane Boston, RN  Positioning for Skin: Supine/Back  Taken 03/17/2023 0033 by Duane Boston, RN  Positioning for Skin: Supine/Back  Taken 03/16/2023 2229 by Duane Boston, RN  Positioning for Skin: Supine/Back  Taken 03/16/2023 2110 by Duane Boston, RN  Positioning for Skin: Supine/Back  Taken 03/16/2023 1915 by Duane Boston, RN  Positioning for Skin: Supine/Back  Intervention: Prevent Infection  Recent Flowsheet Documentation  Taken 03/16/2023 1915 by Duane Boston, RN  Infection Prevention: cohorting utilized  Goal: Optimal Comfort and Wellbeing  Outcome: Progressing  Goal: Readiness for Transition of Care  Outcome: Progressing  Goal: Rounds/Family Conference  Outcome: Progressing

## 2023-03-18 DIAGNOSIS — D48119 Desmoid tumor: Principal | ICD-10-CM

## 2023-03-18 LAB — CBC W/ AUTO DIFF
BASOPHILS ABSOLUTE COUNT: 0 10*9/L (ref 0.0–0.1)
BASOPHILS RELATIVE PERCENT: 0.3 %
EOSINOPHILS ABSOLUTE COUNT: 0 10*9/L (ref 0.0–0.5)
EOSINOPHILS RELATIVE PERCENT: 0.1 %
HEMATOCRIT: 38.8 % (ref 34.0–44.0)
HEMOGLOBIN: 13 g/dL (ref 11.3–14.9)
LYMPHOCYTES ABSOLUTE COUNT: 2 10*9/L (ref 1.1–3.6)
LYMPHOCYTES RELATIVE PERCENT: 19.6 %
MEAN CORPUSCULAR HEMOGLOBIN CONC: 33.4 g/dL (ref 32.0–36.0)
MEAN CORPUSCULAR HEMOGLOBIN: 26.6 pg (ref 25.9–32.4)
MEAN CORPUSCULAR VOLUME: 79.7 fL (ref 77.6–95.7)
MEAN PLATELET VOLUME: 9.9 fL (ref 6.8–10.7)
MONOCYTES ABSOLUTE COUNT: 1 10*9/L — ABNORMAL HIGH (ref 0.3–0.8)
MONOCYTES RELATIVE PERCENT: 10.1 %
NEUTROPHILS ABSOLUTE COUNT: 7.2 10*9/L (ref 1.8–7.8)
NEUTROPHILS RELATIVE PERCENT: 69.9 %
PLATELET COUNT: 233 10*9/L (ref 150–450)
RED BLOOD CELL COUNT: 4.86 10*12/L (ref 3.95–5.13)
RED CELL DISTRIBUTION WIDTH: 15.4 % — ABNORMAL HIGH (ref 12.2–15.2)
WBC ADJUSTED: 10.3 10*9/L (ref 3.6–11.2)

## 2023-03-18 LAB — BASIC METABOLIC PANEL
ANION GAP: 10 mmol/L (ref 5–14)
BLOOD UREA NITROGEN: 9 mg/dL (ref 9–23)
BUN / CREAT RATIO: 15
CALCIUM: 9.9 mg/dL (ref 8.7–10.4)
CHLORIDE: 104 mmol/L (ref 98–107)
CO2: 29 mmol/L (ref 20.0–31.0)
CREATININE: 0.62 mg/dL (ref 0.55–1.02)
EGFR CKD-EPI (2021) FEMALE: >90 mL/min/{1.73_m2} (ref >=60)
GLUCOSE RANDOM: 122 mg/dL (ref 70–179)
POTASSIUM: 4.2 mmol/L (ref 3.4–4.8)
SODIUM: 143 mmol/L (ref 135–145)

## 2023-03-18 MED ADMIN — sodium chloride (NS) 0.9 % flush 10 mL: 10 mL | INTRAVENOUS | @ 02:00:00

## 2023-03-18 MED ADMIN — ondansetron (ZOFRAN-ODT) disintegrating tablet 8 mg: 8 mg | ORAL | @ 03:00:00

## 2023-03-18 MED ADMIN — oxyCODONE (ROXICODONE) 5 mg/5 mL solution 20 mg: 20 mg | ORAL | @ 20:00:00 | Stop: 2023-03-21

## 2023-03-18 MED ADMIN — ondansetron (ZOFRAN-ODT) disintegrating tablet 8 mg: 8 mg | ORAL | @ 22:00:00

## 2023-03-18 MED ADMIN — predniSONE (DELTASONE) tablet 40 mg: 40 mg | ORAL | @ 14:00:00 | Stop: 2023-03-20

## 2023-03-18 MED ADMIN — cetirizine (ZYRTEC) tablet 20 mg: 20 mg | ORAL | @ 02:00:00 | Stop: 2023-03-19

## 2023-03-18 MED ADMIN — escitalopram oxalate (LEXAPRO) tablet 5 mg: 5 mg | ORAL | @ 14:00:00

## 2023-03-18 MED ADMIN — HYDROmorphone (PF) (DILAUDID) injection 1 mg: 1 mg | INTRAVENOUS | @ 23:00:00 | Stop: 2023-03-19

## 2023-03-18 MED ADMIN — sodium chloride (NS) 0.9 % flush 10 mL: 10 mL | INTRAVENOUS | @ 14:00:00

## 2023-03-18 MED ADMIN — ondansetron (ZOFRAN-ODT) disintegrating tablet 8 mg: 8 mg | ORAL | @ 12:00:00

## 2023-03-18 MED ADMIN — nirogacestat (OGSIVEO) tablet 150 mg: 150 mg | ORAL | @ 15:00:00

## 2023-03-18 MED ADMIN — pantoprazole (Protonix) EC tablet 40 mg: 40 mg | ORAL | @ 18:00:00

## 2023-03-18 MED ADMIN — buprenorphine 20 mcg/hour transdermal patch 1 patch: 1 | TRANSDERMAL | @ 22:00:00

## 2023-03-18 MED ADMIN — acetaminophen (TYLENOL) tablet 1,000 mg: 1000 mg | ORAL | @ 12:00:00

## 2023-03-18 MED ADMIN — diphenhydrAMINE (BENADRYL) capsule/tablet 25 mg: 25 mg | ORAL | @ 05:00:00 | Stop: 2023-03-17

## 2023-03-18 MED ADMIN — diphenhydrAMINE (BENADRYL) injection: 50 mg | INTRAVENOUS | @ 14:00:00 | Stop: 2023-03-18

## 2023-03-18 MED ADMIN — oxyCODONE (ROXICODONE) 5 mg/5 mL solution 20 mg: 20 mg | ORAL | @ 13:00:00 | Stop: 2023-03-21

## 2023-03-18 MED ADMIN — cefdinir (OMNICEF) capsule 300 mg: 300 mg | ORAL | @ 03:00:00 | Stop: 2023-03-24

## 2023-03-18 MED ADMIN — naloxegol (MOVANTIK) tablet 12.5 mg: 12.5 mg | ORAL | @ 14:00:00

## 2023-03-18 MED ADMIN — HYDROmorphone (PF) (DILAUDID) injection 1 mg: 1 mg | INTRAVENOUS | @ 01:00:00 | Stop: 2023-03-18

## 2023-03-18 MED ADMIN — cetirizine (ZYRTEC) tablet 20 mg: 20 mg | ORAL | @ 14:00:00 | Stop: 2023-03-18

## 2023-03-18 MED ADMIN — HYDROmorphone (PF) (DILAUDID) injection 1 mg: 1 mg | INTRAVENOUS | @ 05:00:00 | Stop: 2023-03-18

## 2023-03-18 MED ADMIN — nirogacestat (OGSIVEO) tablet 150 mg: 150 mg | ORAL | @ 02:00:00

## 2023-03-18 MED ADMIN — promethazine (PHENERGAN) 12.5 mg in sodium chloride (NS) 0.9 % 50 mL IVPB: 12.5 mg | INTRAVENOUS | @ 01:00:00 | Stop: 2023-03-18

## 2023-03-18 MED ADMIN — cefdinir (OMNICEF) capsule 300 mg: 300 mg | ORAL | @ 14:00:00 | Stop: 2023-03-24

## 2023-03-18 MED ADMIN — famotidine (PEPCID) tablet 20 mg: 20 mg | ORAL | @ 03:00:00

## 2023-03-18 MED ADMIN — acetaminophen (TYLENOL) tablet 1,000 mg: 1000 mg | ORAL | @ 22:00:00

## 2023-03-18 MED ADMIN — acetaminophen (TYLENOL) tablet 1,000 mg: 1000 mg | ORAL | @ 03:00:00

## 2023-03-18 MED ADMIN — oxyCODONE (ROXICODONE) 5 mg/5 mL solution 20 mg: 20 mg | ORAL | @ 06:00:00 | Stop: 2023-03-21

## 2023-03-18 NOTE — Unmapped (Signed)
Visited [name] [name] on [facility] Oncology this morning. She stated that this has been a difficult hospital stay, but that she is feeling much better now and she is so happy that her physicians were able to discover the causes of her pain. She is hopeful that she will be discharged back home in a few days. Provided emotional support.

## 2023-03-18 NOTE — Unmapped (Addendum)
Pt VSS, afebrile, A/Ox4. Pt given PRN pain meds per [facility] along with PRN itching meds per MAR. Pt with adequate pain control and improved itching. Prednisone started to help with physiologic processes post feraheme. Pt scopolamine patch changed. Pt met with AYA team, pain team, and primary oncologist this shift. Pt free of falls or injuries with all safety measures maintained.   Problem: Adult Inpatient Plan of Care  Goal: Plan of Care Review  Outcome: Ongoing - Unchanged  Goal: Patient-Specific Goal (Individualized)  Outcome: Ongoing - Unchanged  Goal: Absence of Hospital-Acquired Illness or Injury  Outcome: Ongoing - Unchanged  Intervention: Identify and Manage Fall Risk  Recent Flowsheet Documentation  Taken 03/17/2023 0945 by Jimmy Picket, RN  Safety Interventions:   low bed   lighting adjusted for tasks/safety   fall reduction program maintained   family at bedside   infection management   nonskid shoes/slippers when out of bed   room near unit station  Intervention: Prevent Skin Injury  Recent Flowsheet Documentation  Taken 03/17/2023 0945 by Jimmy Picket, RN  Positioning for Skin: Supine/Back  Device Skin Pressure Protection:   absorbent pad utilized/changed   adhesive use limited  Skin Protection:   adhesive use limited   cleansing with dimethicone incontinence wipes   drying agents applied  Intervention: Prevent Infection  Recent Flowsheet Documentation  Taken 03/17/2023 0945 by Jimmy Picket, RN  Infection Prevention:   hand hygiene promoted   personal protective equipment utilized   rest/sleep promoted   single patient room provided  Goal: Optimal Comfort and Wellbeing  Outcome: Ongoing - Unchanged  Goal: Readiness for Transition of Care  Outcome: Ongoing - Unchanged  Goal: Rounds/Family Conference  Outcome: Ongoing - Unchanged     Problem: Pain Acute  Goal: Optimal Pain Control and Function  Outcome: Ongoing - Unchanged     Problem: Latex Allergy  Goal: Absence of Allergy Symptoms  Outcome: Ongoing - Unchanged     Problem: Oncology Care  Goal: Effective Coping  Outcome: Ongoing - Unchanged  Goal: Improved Activity Tolerance  Outcome: Ongoing - Unchanged  Goal: Optimal Oral Intake  Outcome: Ongoing - Unchanged  Goal: Improved Oral Mucous Membrane Integrity  Outcome: Ongoing - Unchanged  Intervention: Promote Oral Comfort and Health  Recent Flowsheet Documentation  Taken 03/17/2023 1500 by Jimmy Picket, RN  Oral Mucous Membrane Protection:   lip/mouth moisturizer applied   nonirritating oral fluids promoted   nonirritating oral foods promoted  Taken 03/17/2023 0945 by Jimmy Picket, RN  Oral Mucous Membrane Protection:   lip/mouth moisturizer applied   nonirritating oral fluids promoted   nonirritating oral foods promoted  Goal: Optimal Pain Control and Function  Outcome: Ongoing - Unchanged     Problem: Infection  Goal: Absence of Infection Signs and Symptoms  Outcome: Ongoing - Unchanged  Intervention: Prevent or Manage Infection  Recent Flowsheet Documentation  Taken 03/17/2023 0945 by Jimmy Picket, RN  Infection Management: aseptic technique maintained  Isolation Precautions: protective precautions maintained     Problem: Self-Care Deficit  Goal: Improved Ability to Complete Activities of Daily Living  Outcome: Ongoing - Unchanged     Problem: Malnutrition  Goal: Improved Nutritional Intake  Outcome: Ongoing - Unchanged     Problem: Fall Injury Risk  Goal: Absence of Fall and Fall-Related Injury  Outcome: Ongoing - Unchanged  Intervention: Promote Injury-Free Environment  Recent Flowsheet Documentation  Taken 03/17/2023 0945 by Jimmy Picket, RN  Safety Interventions:   low  bed   lighting adjusted for tasks/safety   fall reduction program maintained   family at bedside   infection management   nonskid shoes/slippers when out of bed   room near unit station

## 2023-03-18 NOTE — Unmapped (Signed)
=  Department of Anesthesiology  Pain Medicine Division    Chronic Pain inpatient Consult follow up Note      Requesting Attending Physician:  Hewitt Blade, MD  Service Requesting Consult:  Oncology/Hematology (MDE)    Assessment/Recommendations:  [name] [name] is a 23 year old female with a complex medical history, including DVT (RUE), [name] syndrome (post-colectomy), desmoid tumors, anemia, and severe protein-calorie malnutrition. She is followed at [facility] for chronic abdominal and right shoulder pain, consistent with cancer-associated pain. 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 [name]???s pain has worsened in recent months, likely due to intermittent intestinal obstructions related to her mesenteric desmoid tumors, which may cause intermittent bowel intussusception. Her pain is further complicated by narcotic bowel syndrome, visceral hyperalgesia, and a positive feedback loop where increased opioid use exacerbates the pain. [name]???s most recent hospitalization was complicated by ileus, requiring NG tube, a prolonged inpatient rehab stay.     Recently, [name] had been seen by [name] in December 2024, who decided to switch her from Suboxone to [facility] 20 mcg/h patch and continue oxycodone 10 mg 3 times a day with flexibility to increase to 5 times a day for acute pain flares. However, over the past couple of weeks, she has experienced worsening abdominal and low back pain, along with an increase in stool frequency (up to 15 times a day with watery stools for 4 weeks) and some hematochezia.    On 03/08/23, she presented for an outpatient MRI but experienced a flare-up of her chronic pain after completing one of the three planned studies. She was admitted to the [facility] emergency department for intractable acute on chronic pain.  Upon evaluation, [name] reported uncontrolled pain, rating her pain as 10/10. She noted that oral oxycodone 10 mg was ineffective, and the night team had not administered IV hydromorphone, which has helped her in the past. She felt she had fallen behind on pain control, leading to worsening symptoms.    In previous hospitalizations, [name] had received ketamine infusions (5/24, 6/6, 8/1) and a lidocaine infusion (09/28/2022). However, there was inconsistency in reporting the effectiveness of these treatments, making it difficult to assess their true benefit.     We have also advised [name] to discuss with [name] to create an ED/Admission care plan to streamline her care, upon her presentation to the hospital in the future. To which [name] and her mother agreeable to.     Interval: No acute overnight events. Leukocytosis resolved.  I first attempted to speak with [name] in the midmorning but she was speaking with the AYA cancer program nurse practitioner at the time.  I was able to return in the early afternoon and joined a discussion between [place], her mother, her Child psychotherapist, and her outpatient oncologist.  We had a very detailed and productive conversation about her hospitalization and the difficulty she has had since being admitted.  [name] voiced frustration with her pain management during this admission as well as the anxiety surrounding her plan of care along with feelings of being invalidated at various points during the hospitalization.  All providers present verbalized sympathy and understanding of her predicament and she was appreciative to this.    I reemphasized to her that I and her other providers are very encouraged by the fact that her pouchitis was identified and is now being treated, along with the hope and expectation that she will  continue to get better day by day from a medical standpoint.  We discussed again how, during previous hospitalizations, she will unfortunately enter a cycle where a precipitating, organic event causes a pain flare requiring a large increase in her pain medications, especially IV opioids, that in turn precipitates a second and longer lasting cycle of worsened abdominal pain due to constipation, opioid tolerance, and opioid-induced hyperalgesia brought about by her increased IV opioid use.      We went into detail discussing the difference between IV and p.o. opioids including the concepts of equianalgesic dosing, opioid dose-dependent side effects as they correlate with plasma concentrations, opioid-induced hyperalgesia, among other opioid related topics that I described in lay terminology. She and her mother verbalized understanding. We also discussed my concerns with her remaining in the hospital for long periods of time, including the risk of iatrogenic complications such as line infections, as well as other issues such as deconditioning and the lack of control that she has while in an inpatient setting, especially as it pertains to the timing of her medications.  She and her mother were receptive to the points made in this discussion.    Ultimately, she and her mother were in agreement that all of her providers have been attempting to treat her in a way that balances providing adequate treatment for her medical issues while also trying to avoid both short and long-term harm.  For example, we discussed how opioid weans, while they may cause temporary periods where her pain may not be under as good of control as it had been at higher doses, are done in a way to slowly prepare her body for how her pain will be managed as an outpatient while avoiding withdrawal symptoms and the long-term effects of opioid tolerance and opioid-induced hyperalgesia all the while attempting to get her out of the hospital to minimize the risk of hospital associated complications and to minimize her psychological distress while admitted.    I also pointed out that she has made a very clear medical improvement since being admitted and that since her last, prolonged inpatient admission over the summer she has accomplished many difficult tasks that required significant amounts of willpower and bravery including tolerating her wisdom tooth extractions in December and other minor pain flares since her last admission.  Finally, we discussed that this hospitalization seems to be qualitatively different than the one over the summer and should be a hopefully short-lived admission.  She was likewise receptive to the points made here as well.    Nevertheless, she feels that she has been only able to achieve 1 day of subjective relief from her pain and feels that she has been weaned to aggressively.  She understands the risks of an opioid escalation but feels that she needs to allow her body to rest briefly prior to attempting to discharge even with close follow-up.  As a result of joint decision making she agreed to increasing her IV Dilaudid to 1 mg 3 times daily as needed and weaning by 1 dose per day until a planned discharge on Thursday, 1/9.  She is aware that we can be flexible if needed with this wean but she also agreed as a part of this plan to try to do her best to stick to the estimated timeline for discharge.    I will plan to have her follow up with our pain psychologists in the outpatient setting as well.     Recommendations:  -The chronic pain service  is a consult service and does not place orders, just makes recommendations (except ketamine and lidocaine infusions)  -Please evaluate all patients on opioids for appropriateness of prescribing narcan at discharge.  The chronic pain service can assist with this.  Nasal narcan covered by most insurance.  -Recommendations given apply to the current hospitalization and do not reflect long term recommendations.    -Re-add IV HM 1 mg TID PRN for 1/7, please specify that the IV HM should not be given within one hour of oxycodone   - Decrease IV HM to 1 mg BID PRN on 1/8   - Decrease IV HM to 1 mg daily PRN on 1/9  -Continue Butrans patch 20 mcg/h qWeekly  -Continue oxycodone liquid 20 mg every 4 hours as needed, plan to continue as an outpatient   - Will plan to see me in clinic, either in person or virtually, on 1/ 15  -Continue Tylenol 1 g 3 times daily    We will continue to follow.    Medications trialed during this admission (or pertinent in past): Hydromorphone IV, oxycodone oral, Butrans patch    Analgesia Evaluation:  Pain at minimum: 5/10  Pain at maximum: 9/10    Allergies    Allergies   Allergen Reactions    Adhesive Tape-Silicones Hives and Rash     Paper tape and Tegederm ok. Biopatch causes blistering but has tolerated CHG Tegaderm    Ferrlecit [Sodium Ferric Gluconat-Sucrose] Swelling and Rash    Levofloxacin Swelling and Rash     Swelling in mouth, rash,     Methylnaltrexone      Per Patient: I lost bowel control, severe abdominal cramping, and elevated BP    Neomycin Swelling     Rxn after ear drops; ear swelling    Papaya Hives    Morphine Nausea And Vomiting    Zosyn [Piperacillin-Tazobactam] Itching and Rash     Red and itchy    Compazine [Prochlorperazine] Other (See Comments)     Extreme agitation    Iron Analogues     Reglan [Metoclopramide Hcl] Diarrhea    Iron Dextran Itching     Received iron dextran 06/08/22 over 12 hours, had itching and redness/flushing during the infusion and for a couple days after. Required IV benadryl w/flares between doses and ultimately treated w/IV methylpred for 2 days.     Latex, Natural Rubber Rash         Inpatient Medications  Current Facility-Administered Medications   Medication Dose Route Frequency Provider Last Rate Last Admin    acetaminophen (TYLENOL) tablet 1,000 mg  1,000 mg Oral Q8H SCH Grant Fontana, MD   1,000 mg at 03/17/23 1351    buprenorphine 20 mcg/hour transdermal patch 1 patch  1 patch Transdermal Q7 Days Grant Fontana, MD   1 patch at 03/11/23 1308    cefdinir (OMNICEF) capsule 300 mg  300 mg Oral Q12H [facility] Grant Fontana, MD   300 mg at 03/17/23 1047    cetirizine (ZYRTEC) tablet 20 mg  20 mg Oral BID Garnet Sierras, MD   20 mg at 03/17/23 1047    CHEMO CLARIFICATION ORDER   Other Continuous PRN Grant Fontana, MD        diphenhydrAMINE (BENADRYL) injection  50 mg Intravenous PRN (once a day) Garnet Sierras, MD   50 mg at 03/17/23 1855    emollient combination no.92 (LUBRIDERM) lotion   Topical Q1H PRN Grant Fontana, MD  Given at 03/11/23 1447    escitalopram oxalate (LEXAPRO) tablet 5 mg  5 mg Oral Daily Grant Fontana, MD   5 mg at 03/17/23 1047    famotidine (PEPCID) tablet 20 mg  20 mg Oral Nightly Grant Fontana, MD   20 mg at 03/16/23 2238    famotidine (PEPCID) tablet 40 mg  40 mg Oral Q4H PRN Garnet Sierras, MD   40 mg at 03/16/23 1731    fluticasone propionate (FLONASE) 50 mcg/actuation nasal spray 2 spray  2 spray Topical Q7 Days Grant Fontana, MD   2 spray at 03/09/23 0249    hydrocortisone 2.5 % cream   Topical BID PRN Grant Fontana, MD   Given at 03/15/23 1054    HYDROmorphone (PF) (DILAUDID) injection 1 mg  1 mg Intravenous TID PRN Garnet Sierras, MD   1 mg at 03/17/23 2005    Followed by    Melene Muller ON 03/18/2023] HYDROmorphone (PF) (DILAUDID) injection 1 mg  1 mg Intravenous BID PRN Garnet Sierras, MD        Followed by    Melene Muller ON 03/19/2023] HYDROmorphone (PF) (DILAUDID) injection 1 mg  1 mg Intravenous PRN (once a day) Gogerly-Moragoda, Ellene Route, MD        IP OKAY TO TREAT   Other Continuous PRN Grant Fontana, MD        LORazepam (ATIVAN) tablet 1 mg  1 mg Oral TID PRN Grant Fontana, MD   1 mg at 03/16/23 1154    naloxegol (MOVANTIK) tablet 12.5 mg  12.5 mg Oral Daily Garnet Sierras, MD   12.5 mg at 03/17/23 1047    naloxone (NARCAN) injection 0.4 mg  0.4 mg Intravenous Once PRN Grant Fontana, MD        nirogacestat (OGSIVEO) tablet 150 mg  150 mg Oral BID Grant Fontana, MD   150 mg at 03/17/23 1050    ondansetron (ZOFRAN-ODT) disintegrating tablet 8 mg  8 mg Oral Q8H SCH Gogerly-Moragoda, Ellene Route, MD   8 mg at 03/17/23 1351    oxyCODONE (ROXICODONE) 5 mg/5 mL solution 20 mg  20 mg Oral Q4H PRN Garnet Sierras, MD   20 mg at 03/17/23 1716    pantoprazole (Protonix) EC tablet 40 mg  40 mg Oral Daily Grant Fontana, MD   40 mg at 03/17/23 1351    predniSONE (DELTASONE) tablet 40 mg  40 mg Oral Daily Garnet Sierras, MD   40 mg at 03/17/23 1446    promethazine (PHENERGAN) 12.5 mg in sodium chloride (NS) 0.9 % 50 mL IVPB  12.5 mg Intravenous BID PRN Garnet Sierras, MD 171 mL/hr at 03/17/23 2005 12.5 mg at 03/17/23 2005    Followed by    Melene Muller ON 03/18/2023] promethazine (PHENERGAN) 12.5 mg in sodium chloride (NS) 0.9 % 50 mL IVPB  12.5 mg Intravenous PRN (once a day) Gogerly-Moragoda, Ellene Route, MD        saliva stimulant mucosal spray (BIOTENE)  1 spray Topical Q2H PRN Grant Fontana, MD   1 spray at 03/12/23 1309    scopolamine (TRANSDERM-SCOP) 1 mg over 3 days topical patch 1 mg  1 patch Topical Q72H Grant Fontana, MD   1 mg at 03/17/23 1445    sodium chloride (NS) 0.9 % flush 10 mL  10 mL Intravenous BID Grant Fontana, MD   10 mL at 03/17/23 1049    sodium chloride (  NS) 0.9 % flush 10 mL  10 mL Intravenous BID Grant Fontana, MD   10 mL at 03/14/23 2056    sodium chloride (NS) 0.9 % flush 10 mL  10 mL Intravenous BID Grant Fontana, MD   10 mL at 03/17/23 1049    sodium chloride (NS) 0.9 % infusion  20 mL/hr Intravenous Continuous Grant Fontana, MD   Held at 03/09/23 0250    triamcinolone (KENALOG) 0.1 % ointment   Topical BID PRN Grant Fontana, MD   Given at 03/13/23 0051     Lab Results   Component Value Date    CREATININE 0.56 03/17/2023       Lab Results   Component Value Date    ALKPHOS 66 03/17/2023    BILITOT 0.2 (L) 03/17/2023    BILIDIR <0.10 03/17/2023    PROT 5.8 03/17/2023    ALBUMIN 3.2 (L) 03/17/2023    ALT 25 03/17/2023    AST 12 03/17/2023       Encounter Date: 03/08/23   ECG 12 Lead   Result Value    EKG Systolic BP     EKG Diastolic BP     EKG Ventricular Rate 72    EKG Atrial Rate 72    EKG P-R Interval 134    EKG QRS Duration 86    EKG Q-T Interval 418    EKG QTC Calculation 457    EKG Calculated P Axis 49    EKG Calculated R Axis 16    EKG Calculated T Axis 35    QTC Fredericia 444    Narrative    NORMAL SINUS RHYTHM  NORMAL ECG  WHEN COMPARED WITH ECG OF 08-Mar-2023 23:46,  NO SIGNIFICANT CHANGE WAS FOUND  Confirmed by Schuyler Amor ([id number]) on 03/15/2023 2:59:17 PM       No results found for requested labs within last 30 days.       Objective:     Vital Signs  Temp:  [36.6 ??C (97.9 ??F)-37.3 ??C (99.1 ??F)] 37.3 ??C (99.1 ??F)  Heart Rate:  [61-76] 70  Resp:  [16-18] 16  BP: (103-132)/(64-81) 132/78  MAP (mmHg):  [77-95] 90  SpO2:  [97 %-100 %] 100 %    Physical Exam    GENERAL:  Well developed, well-nourished female and is in no apparent distress. Resting comfortably in bed but at times upset and anxious.  HEAD/NECK:    Reveals normocephalic/atraumatic.   CARDIOVASCULAR:   Regular rate  LUNGS:   Normal work of breathing, no supplemental 02  MUSCULOSKELETAL:    Moving all 4 extremities.    SKIN:  No obvious rashes lesions or erythema  PSY:  Appropriate affect and mood.    Problem List    Principal Problem:    Chronic pain due to neoplasm  Active Problems:    [name]    Generalized anxiety disorder with panic attacks    GI bleeding    MDD (major depressive disorder)    Dizziness    Fatigue     Nat Christen., MD  Assistant Professor of Anesthesiology  Department of Anesthesiology   Divisions of General Anesthesiology and Pain Medicine    Medical Decision Making    Problems Addressed:  1 or more chronic illnesses with severe exacerbation, progression, or side effects of treatment (high)    Amount/Complexity:  Reviewed external records: Primary team progress note, GI progress note  Reviewed results: 1/7 CBC, 1/7 CMP  Ordered tests: none  Assessment requiring independent historian: none  Discussion of management/test results with medical professional: Primary team, outpatient oncologist  Independent interpretation of test: none    Risk  High: Drug therapy requiring intensive monitoring for toxicity, Decision regarding hospitalization, Elective Major Surgery w/ risk factors, Emergency Major Surgery, Decision to DNR or de-escalate care because of poor prognosis, parental controlled substances (IV opioids)

## 2023-03-18 NOTE — Unmapped (Signed)
Oncology (MEDO) Progress Note    Assessment & Plan:   [name] [name] is a 23 y.o. female whose presentation is complicated by [name] c/b desmoid fibromatomsis, recurrent ileus, GI bleeding, complex pain, PTSD secondary to chronic medical condition and multiple healthcare interactions who presented to [facility] with refractory pain in setting of large tumor burden and diarrhea secondary to pouchitis.     Principal Problem:    Chronic pain due to neoplasm  Active Problems:    [name]    Generalized anxiety disorder with panic attacks    GI bleeding    MDD (major depressive disorder)    Dizziness    Fatigue    Active Problems    Acute on Chronic Generalized & Abdominal Pain - [name] Burden  Pain Related Nausea   The patient follows with [facility] pain [facility], most recently was seen in clinic in December 2024. It has been documented that IV opioids likely precipitate narcotic bowel syndrome and generates a positive feedback loop. Patient with multiple instances of ileus while hospitalized due to high requirement of opioids. She has been placed on an IV lidocaine drip and IV ketamine in the past for uncontrolled pain, and has been noted by her outpatient pain providers that this would be recommended for future inpatient treatments (see pain management note 02/18/23). She was evaluated by chronic pain and decided to avoid ketamine at this time as she only has one more session available before May 2025.   - Evaluated by chronic pain team, appreciate recs   - Tylenol 1000 mg 3 times daily scheduled  - Oxycodone 20 mg oral solution q4h PRN, encouraged patient to take regularly  - Dilaudid 1 mg TID PRN on 1/7, then BID PRN on 1/8, then once daily PRN on 1/9  - Ok to receive one dose IV on day of discharge if needed    - Butrans patch   - Lidocaine patch  - Anti-emetics:    - Zofran 8 mg q8h PO scheduled    - Phenergan 12.5 mg IVPB BID PRN on 1/7, then once daily PRN 1/8, then discuss moving to PO PRN on 1/9    Urticaria 2/2 IV FeraHeme   Iron deficiency anemia - History of DVT   Follows with [facility]. History of anaphylaxis to IV iron and will need pre and post medication during any treatments. Most recently received FeraHeme during August 2024 hospitalization with rash/itching but overall was able to complete infusion. Ferritin 37, Tsat 13, iron 29. Iron deficit ~500 mg. Plan for IV iron this admission with pre-medication. Received IV Feraheme on 1/5. Pre-medicated with cetirizine, steroids, benadryl, famotidine as outlined in Allergy note from July 2024. She developed urticaria approximately 30 minutes into infusion but was able to complete the infusion with slowing the rate and further doses of IV benadryl and IV famotidine. Continues to have urticaria on 1/7, two days after IV iron infusion. Will treat with prednisone and PRN IV benadryl for severe itching.   - monitor Hgb daily   - continue BID cetirizine for 72 hours after IV iron infusion.   - prednisone 40 mg x 3 days (1/7 - 1/9)  - IV benadryl 50 mg once daily PRN for severe itching 1/7-1/9    Mild Pouchitis   Diarrhea - Hematochezia - Anastomotic ulcers   Dizziness - Fatigue - Malnutrition   Patient reports recent dizziness upon standing and generalized feeling of malaise. Notes 15-20 stools per day this week, increase from baseline  of 10 stools per day. She reports she has lost 5lbs over the past week or so. Of note, she did have her corpak removed early December due to blockage. Was on TF regimen Osmolite 1.5 as tolerated, 50-75 mL/h. No objective evidence by imaging or endoscopic findings to suggest etiology of inability to tolerate PO intake. Follows with nutrition outpatient. Suspect patient is orthostatic 2/2 poor PO intake and increased stools which are bloody in nature. She underwent endoscopy of pouch 1/3 after which she had severe left lower quadrant abdominal pain bowel movements with particles and blood which she noted were unusual for her post bowel prep. She was evaluated by GI and exam was reassuring to manage symptomatically. KUB without concern for pneumoperitoneum. Having her baseline bowel movements on 1/4.   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 [name].  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  Was most recently seen on 12/17 at which time nirogacestat was continued.  Of note, outpatient MRI was scheduled for restaging of desmoid tumor which was partially completed today. Patient reports she was only able to tolerate the abdomen and pelvis. Her pain was too great to continue.  - Notified Dr. [name] of admission  - Nirogacestat while in hospital, patient supplied and verified by pharmacy (cannot be within 2 hours of pantoprazole or famotidine)   - Outpatient MRI scans obtained inpatient on 03/11/23    Hypercalcemia (resolved)   Total calcium of 10.8. Asymptomatic. iCal normal at 4.66.    PTSD - Generalized Anxiety Disorder - Major Depressive Disorder  PTSD secondary to complex medical condition and interactions with medical system. Follows with [facility].   - continue nightly Mirtazapine 7.5 mg   - Escitalopram 5 mg daily     Issues Impacting Complexity of Management:  -Parenteral controlled medications: IV Dilaudid    Daily Checklist:  Diet: Regular Diet  DVT PPx: SCDs  Electrolytes: Replete Potassium to >/= 3.6 and Magnesium to >/= 1.8  Code Status: Full Code  Dispo: Home pending improvement of pain     Team Contact Information:   Primary Team: Oncology (MEDO)  Primary Resident: Garnet Sierras, MD  Resident's Pager: [phone] (Oncology Intern - Alvester Morin)    Interval History:   Continues to have LLQ pain post endoscopy, still present but getting a bit better each day.   Has not been eating well.   Restarted movantik yesterday and now having too frequent BM. Will plan to hold tomorrow 1/8.     Many of her outpatient providers were able to visit at bedside today- and by coincidence all at the same time! She appreciated having her outpatient oncologist, pain attg, nurse navigator and AYA provider all present to hear about her experience and have a discussion about her care  and the plan moving forward. She was in high spirits after.     Objective:   Temp:  [36.6 ??C (97.9 ??F)-37 ??C (98.6 ??F)] 36.8 ??C (98.2 ??F)  Heart Rate:  [61-76] 74  Resp:  [16-18] 16  BP: (103-132)/(64-82) 132/81  SpO2:  [97 %-99 %] 99 %    Gen: NAD  HENT: atraumatic, normocephalic  Heart: regular rate and rhythm   Lungs: normal work of breathing on room air   Abdomen: soft, mildly distended  Extremities: no edema

## 2023-03-18 NOTE — Unmapped (Signed)
[facility])  Consultation-Liaison (CL) Psychiatry  CL Psychology Initial Evaluation    Service Date:  03/18/23  Admit Date:  03/08/23  Clinician: Arlana Pouch, PsyD  Intervention: Psychodiagnostic Evaluation   Face-to-Face Clinical Contact with Patient: 45 min  Method of Interaction: In-Person    BACKGROUND INFORMATION AND REASON FOR REFERRAL:  Please see H&P and other recent records for full details. 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 The patient was admitted to?[facility] for management?of the concerns above. CL Psychology services were requested due to concern for anxiety.      ASSESSMENT  IMPRESSIONS/SUMMARY:    Upon evaluation, the patient endorsed heightened anxiety in the setting of numerous triggers related to past traumatic admission that has resulted in increased stress response. Namely, Pt identified changes in care plan without timely communication as primary trigger for stress response. Evaluation limited as encounter was interrupted by Pain Management. Pt has established outpatient mental healthcare and her needs appear to be adequately addressed with current clinicians. Will follow-up with Pt to address anxiety related to this admission as time allows.     DIAGNOSTIC IMPRESSION:    Trauma/stressor-disorder, unspecified  Major depressive disorder, recurrent episode, mild (by history)    Risk Assessment:  ASQ screening result: low risk    -A suicide and violence risk assessment was performed as part of this evaluation. Risk factors for self-harm/suicide: current diagnosis of depression and chronic severe medical condition.  Protective factors against self-harm/suicide:  lack of active SI, no history of previous suicide attempts , motivation for treatment, currently receiving mental health treatment, has access to clinical interventions and support, utilization of positive coping skills, supportive family, presence of a significant relationship, presence of an available support system, expresses purpose for living, current treatment compliance, effective problem solving skills, and safe housing.  Risk factors for harm to others: N/A. Protective factors against harm to others: no known history of violence towards others, no known history of threats of harm towards others, no active symptoms of psychosis, no active symptoms of mania, high intellectual functioning, positive social orientation, and connectedness to family.     Current suicide risk: low risk  Current homicide risk: low risk        PLAN   RECOMMENDATIONS:    ## Safety and Observation Level:   -This patient is not currently under IVC. If safety concerns arise, please page psychiatry for an evaluation. Recommend routine level of observation per primary team.    ## Follow-up:  -The patient desires continued follow-up with CL Psychology while medically inpatient. Patient's diagnoses, impairment related to the diagnoses, and probability of these interventions to alleviate symptoms and improve functioning meet criteria for medical necessity of inpatient psychotherapy.   -Referral to Psychiatry is not clinically indicated at this time. Should additional psychiatric concerns arise, please consult Psychiatry.     ## Disposition:  -There are no psychological contraindications to discharging this patient when medically appropriate.   - When this patient is discharged, please ensure that their AVS includes information about the 69 Suicide & Crisis Lifeline.    ## Behavioral/Environmental:  -This patient would benefit from more frequent communication with primary team. If feasible, please try to check-in with this patient in the afternoon.     SUBJECTIVE  PRESENTING PROBLEM AND HISTORY:    At bedside, the patient recounted the events that led to hospitalization and provided a brief overview of their mental health history. With respect to mood, the patient endorsed acute on chronic anxiety in the setting of multiple triggers this admission of traumatic/distressing events that occurred during her previous admission. Regarding salient triggers, Pt indicated distress is exacerbated when the plan of care changes and is not communicated to Pt in a timely manner. Pt was provided supportive space to process emotional reactions, which were validated. Pt shared she is hopeful to discharge on Friday. Evaluation interrupted by Pain Management; deferred completion of evaluation per Pt preference to speak with this provider.      PSYCHIATRIC HISTORY  Past mental health dx(s):  PTSD, MDD, GAD, insomnia  (per record review)  Past engagement in psychotherapy: Yes; Pt previously followed with [name], LCSW and was recently transferred to a clinician in the community  Past use of psychotropic medications:  OP regimen includes mirtazapine, escitalopram, and lorazepam (prescribed by Maryagnes Amos, PMHNP)   Past psychiatric hospitalizations: None  Past suicide attempts/non-suicidal self-injury: None  Trauma history: Medical trauma    SOCIAL HISTORY  Marital status: Single  Children: None  Education:  In nursing school    SUBSTANCE USE  Unable to assess due to time constraints. Per records, Pt does not use any substances (including tobacco, alcohol, or illicit substances) and has no history of substance misuse.    OBJECTIVE     MENTAL STATUS EXAM:    Appearance: Appears stated age  Behavior: Actively engaged in conversation  Psychomotor activity: Wnl  Eye-contact: Maintained throughout session  Mood: Anxious  Affect: Full and mood congruent  Speech: Normal, fluid pace  Thought Process: Appropriate, appeared able to track conversation  Thought Content: Appropriate throughout session  Perceptions: No evidence of hallucinations or delusions  Orientation: Oriented to person, place, situation  Insight: Intact awareness of self/acceptance of problems  Judgment:Intact      EDUCATION/INTERVENTIONS    -Described role of CL Psychology, the brief nature of services while the patient is medically inpatient, the ability to work with the patient's medical team to coordinate care, and to provide brief, problem-focused interventions.  -Obtained informed consent to treatment including alternatives, benefits, and risks of treatment.   -Provided psychoeducation related to the patient's presenting concern, including contributing behavioral factors.   -Provided supportive and safe environment for the patient to discuss traumatic event from the past. Additionally, provided psychoeducation regarding how this event may have impacted the patient's view of self and others.   -Engaged the patient in supportive discussion of available resources.  -Discussed treatment options with the patient and explored goals of care. The patient was actively involved in developing their plan of care.

## 2023-03-18 NOTE — Unmapped (Signed)
Pt alert and oriented. VSS, no falls and afebrile. Pt received PRN dilaudid, phenergan, benadryl, and oxy; see MAR. Pt c/o itching and red scattered splotches; provider notified and saw pt bedside. Safety measures remain in place with bed low, family bedside, brakes locked, side rails up, call bell in reach, non-skid footwear while out of bed.     Problem: Adult Inpatient Plan of Care  Goal: Plan of Care Review  Outcome: Ongoing - Unchanged  Goal: Patient-Specific Goal (Individualized)  Outcome: Ongoing - Unchanged  Goal: Absence of Hospital-Acquired Illness or Injury  Outcome: Ongoing - Unchanged  Intervention: Identify and Manage Fall Risk  Recent Flowsheet Documentation  Taken 03/17/2023 2035 by Dianna Limbo, RN  Safety Interventions:   low bed   lighting adjusted for tasks/safety   fall reduction program maintained   family at bedside  Intervention: Prevent Skin Injury  Recent Flowsheet Documentation  Taken 03/17/2023 2035 by Dianna Limbo, RN  Positioning for Skin: Supine/Back  Device Skin Pressure Protection: absorbent pad utilized/changed  Skin Protection: adhesive use limited  Intervention: Prevent Infection  Recent Flowsheet Documentation  Taken 03/17/2023 2035 by Dianna Limbo, RN  Infection Prevention: hand hygiene promoted  Goal: Optimal Comfort and Wellbeing  Outcome: Ongoing - Unchanged  Goal: Readiness for Transition of Care  Outcome: Ongoing - Unchanged  Goal: Rounds/Family Conference  Outcome: Ongoing - Unchanged     Problem: Pain Acute  Goal: Optimal Pain Control and Function  Outcome: Ongoing - Unchanged     Problem: Latex Allergy  Goal: Absence of Allergy Symptoms  Outcome: Ongoing - Unchanged     Problem: Oncology Care  Goal: Effective Coping  Outcome: Ongoing - Unchanged  Goal: Improved Activity Tolerance  Outcome: Ongoing - Unchanged  Intervention: Promote Improved Energy  Recent Flowsheet Documentation  Taken 03/17/2023 2035 by Dianna Limbo, RN  Activity Management: ambulated in room  Goal: Optimal Oral Intake  Outcome: Ongoing - Unchanged  Goal: Improved Oral Mucous Membrane Integrity  Outcome: Ongoing - Unchanged  Intervention: Promote Oral Comfort and Health  Recent Flowsheet Documentation  Taken 03/17/2023 2035 by Dianna Limbo, RN  Oral Mucous Membrane Protection: nonirritating oral fluids promoted  Goal: Optimal Pain Control and Function  Outcome: Ongoing - Unchanged     Problem: Infection  Goal: Absence of Infection Signs and Symptoms  Outcome: Ongoing - Unchanged  Intervention: Prevent or Manage Infection  Recent Flowsheet Documentation  Taken 03/17/2023 2035 by Dianna Limbo, RN  Infection Management: aseptic technique maintained  Isolation Precautions: protective precautions maintained     Problem: Self-Care Deficit  Goal: Improved Ability to Complete Activities of Daily Living  Outcome: Ongoing - Unchanged     Problem: Malnutrition  Goal: Improved Nutritional Intake  Outcome: Ongoing - Unchanged     Problem: Fall Injury Risk  Goal: Absence of Fall and Fall-Related Injury  Outcome: Ongoing - Unchanged  Intervention: Promote Injury-Free Environment  Recent Flowsheet Documentation  Taken 03/17/2023 2035 by Dianna Limbo, RN  Safety Interventions:   low bed   lighting adjusted for tasks/safety   fall reduction program maintained   family at bedside

## 2023-03-19 DIAGNOSIS — G894 Chronic pain syndrome: Principal | ICD-10-CM

## 2023-03-19 DIAGNOSIS — R198 Other specified symptoms and signs involving the digestive system and abdomen: Principal | ICD-10-CM

## 2023-03-19 DIAGNOSIS — R1084 Generalized abdominal pain: Principal | ICD-10-CM

## 2023-03-19 DIAGNOSIS — G893 Neoplasm related pain (acute) (chronic): Principal | ICD-10-CM

## 2023-03-19 LAB — BASIC METABOLIC PANEL
ANION GAP: 8 mmol/L (ref 5–14)
BLOOD UREA NITROGEN: 10 mg/dL (ref 9–23)
BUN / CREAT RATIO: 18
CALCIUM: 9.8 mg/dL (ref 8.7–10.4)
CHLORIDE: 104 mmol/L (ref 98–107)
CO2: 29 mmol/L (ref 20.0–31.0)
CREATININE: 0.56 mg/dL (ref 0.55–1.02)
EGFR CKD-EPI (2021) FEMALE: >90 mL/min/{1.73_m2} (ref >=60)
GLUCOSE RANDOM: 140 mg/dL (ref 70–179)
POTASSIUM: 3.8 mmol/L (ref 3.4–4.8)
SODIUM: 141 mmol/L (ref 135–145)

## 2023-03-19 LAB — CBC W/ AUTO DIFF
BASOPHILS ABSOLUTE COUNT: 0 10*9/L (ref 0.0–0.1)
BASOPHILS RELATIVE PERCENT: 0.2 %
EOSINOPHILS ABSOLUTE COUNT: 0.1 10*9/L (ref 0.0–0.5)
EOSINOPHILS RELATIVE PERCENT: 0.8 %
HEMATOCRIT: 36.4 % (ref 34.0–44.0)
HEMOGLOBIN: 12.5 g/dL (ref 11.3–14.9)
LYMPHOCYTES ABSOLUTE COUNT: 3.7 10*9/L — ABNORMAL HIGH (ref 1.1–3.6)
LYMPHOCYTES RELATIVE PERCENT: 27.2 %
MEAN CORPUSCULAR HEMOGLOBIN CONC: 34.3 g/dL (ref 32.0–36.0)
MEAN CORPUSCULAR HEMOGLOBIN: 27.5 pg (ref 25.9–32.4)
MEAN CORPUSCULAR VOLUME: 80.3 fL (ref 77.6–95.7)
MEAN PLATELET VOLUME: 10.1 fL (ref 6.8–10.7)
MONOCYTES ABSOLUTE COUNT: 0.9 10*9/L — ABNORMAL HIGH (ref 0.3–0.8)
MONOCYTES RELATIVE PERCENT: 6.8 %
NEUTROPHILS ABSOLUTE COUNT: 8.7 10*9/L — ABNORMAL HIGH (ref 1.8–7.8)
NEUTROPHILS RELATIVE PERCENT: 65 %
PLATELET COUNT: 220 10*9/L (ref 150–450)
RED BLOOD CELL COUNT: 4.53 10*12/L (ref 3.95–5.13)
RED CELL DISTRIBUTION WIDTH: 15.4 % — ABNORMAL HIGH (ref 12.2–15.2)
WBC ADJUSTED: 13.5 10*9/L — ABNORMAL HIGH (ref 3.6–11.2)

## 2023-03-19 MED ORDER — ONDANSETRON 8 MG DISINTEGRATING TABLET
ORAL_TABLET | Freq: Three times a day (TID) | 0 refills | 30.00 days | PRN
Start: 2023-03-19 — End: 2023-04-18

## 2023-03-19 MED ORDER — PROMETHAZINE 12.5 MG TABLET
ORAL_TABLET | Freq: Two times a day (BID) | ORAL | 0 refills | 7.00 days | PRN
Start: 2023-03-19 — End: ?

## 2023-03-19 MED ORDER — DIPHENHYDRAMINE 50 MG CAPSULE
ORAL_CAPSULE | Freq: Two times a day (BID) | ORAL | 0 refills | 7.00 days | PRN
Start: 2023-03-19 — End: 2023-03-26

## 2023-03-19 MED ORDER — MIRTAZAPINE 7.5 MG TABLET
ORAL_TABLET | Freq: Every evening | ORAL | 0 refills | 30.00 days
Start: 2023-03-19 — End: 2023-04-18

## 2023-03-19 MED ORDER — CETIRIZINE 10 MG TABLET
ORAL_TABLET | Freq: Two times a day (BID) | ORAL | 0 refills | 7.00 days
Start: 2023-03-19 — End: 2023-03-26

## 2023-03-19 MED ADMIN — diphenhydrAMINE (BENADRYL) injection: 50 mg | INTRAVENOUS | @ 20:00:00 | Stop: 2023-03-19

## 2023-03-19 MED ADMIN — oxyCODONE (ROXICODONE) 5 mg/5 mL solution 20 mg: 20 mg | ORAL | @ 03:00:00 | Stop: 2023-03-21

## 2023-03-19 MED ADMIN — cefdinir (OMNICEF) capsule 300 mg: 300 mg | ORAL | @ 14:00:00 | Stop: 2023-03-24

## 2023-03-19 MED ADMIN — HYDROmorphone (PF) (DILAUDID) injection 1 mg: 1 mg | INTRAVENOUS | @ 05:00:00 | Stop: 2023-03-19

## 2023-03-19 MED ADMIN — ondansetron (ZOFRAN-ODT) disintegrating tablet 8 mg: 8 mg | ORAL | @ 20:00:00

## 2023-03-19 MED ADMIN — sodium chloride (NS) 0.9 % flush 10 mL: 10 mL | INTRAVENOUS | @ 02:00:00

## 2023-03-19 MED ADMIN — cefdinir (OMNICEF) capsule 300 mg: 300 mg | ORAL | @ 04:00:00 | Stop: 2023-03-24

## 2023-03-19 MED ADMIN — acetaminophen (TYLENOL) tablet 1,000 mg: 1000 mg | ORAL | @ 20:00:00

## 2023-03-19 MED ADMIN — oxyCODONE (ROXICODONE) 5 mg/5 mL solution 20 mg: 20 mg | ORAL | @ 20:00:00 | Stop: 2023-03-21

## 2023-03-19 MED ADMIN — sodium chloride (NS) 0.9 % flush 10 mL: 10 mL | INTRAVENOUS | @ 14:00:00

## 2023-03-19 MED ADMIN — ondansetron (ZOFRAN-ODT) disintegrating tablet 8 mg: 8 mg | ORAL | @ 14:00:00

## 2023-03-19 MED ADMIN — diphenhydrAMINE (BENADRYL) capsule 50 mg: 50 mg | ORAL | @ 23:00:00

## 2023-03-19 MED ADMIN — nirogacestat (OGSIVEO) tablet 150 mg: 150 mg | ORAL | @ 02:00:00

## 2023-03-19 MED ADMIN — acetaminophen (TYLENOL) tablet 1,000 mg: 1000 mg | ORAL | @ 14:00:00

## 2023-03-19 MED ADMIN — predniSONE (DELTASONE) tablet 40 mg: 40 mg | ORAL | @ 14:00:00 | Stop: 2023-03-19

## 2023-03-19 MED ADMIN — nirogacestat (OGSIVEO) tablet 150 mg: 150 mg | ORAL | @ 14:00:00

## 2023-03-19 MED ADMIN — promethazine (PHENERGAN) 12.5 mg in sodium chloride (NS) 0.9 % 50 mL IVPB: 12.5 mg | INTRAVENOUS | @ 05:00:00 | Stop: 2023-03-19

## 2023-03-19 MED ADMIN — ondansetron (ZOFRAN-ODT) disintegrating tablet 8 mg: 8 mg | ORAL | @ 03:00:00

## 2023-03-19 MED ADMIN — escitalopram oxalate (LEXAPRO) tablet 5 mg: 5 mg | ORAL | @ 14:00:00

## 2023-03-19 MED ADMIN — pantoprazole (Protonix) EC tablet 40 mg: 40 mg | ORAL | @ 20:00:00

## 2023-03-19 MED ADMIN — oxyCODONE (ROXICODONE) 5 mg/5 mL solution 20 mg: 20 mg | ORAL | @ 15:00:00 | Stop: 2023-03-21

## 2023-03-19 MED ADMIN — cetirizine (ZYRTEC) tablet 20 mg: 20 mg | ORAL | @ 23:00:00

## 2023-03-19 MED ADMIN — famotidine (PEPCID) tablet 20 mg: 20 mg | ORAL | @ 04:00:00

## 2023-03-19 MED ADMIN — cetirizine (ZYRTEC) tablet 20 mg: 20 mg | ORAL | @ 02:00:00 | Stop: 2023-03-18

## 2023-03-19 MED ADMIN — acetaminophen (TYLENOL) tablet 1,000 mg: 1000 mg | ORAL | @ 04:00:00

## 2023-03-19 MED ADMIN — diphenhydrAMINE (BENADRYL) injection: 50 mg | INTRAVENOUS | @ 02:00:00 | Stop: 2023-03-18

## 2023-03-19 NOTE — Unmapped (Signed)
Pt VSS, afebrile. A/Ox4, room air, independent. Pt received all meds-tolerated well. Pt given 1x PRN dose of IV Benadryl, 2x PRN doses of Oxy, and 1x PRN dose of IV Dilaudid. Weekly pain patch replaced. Pt took a shower. No falls and all safety precautions maintained. Mother at bedside. Will CTM.      Problem: Adult Inpatient Plan of Care  Goal: Plan of Care Review  Outcome: Ongoing - Unchanged  Goal: Patient-Specific Goal (Individualized)  Outcome: Ongoing - Unchanged  Goal: Absence of Hospital-Acquired Illness or Injury  Outcome: Ongoing - Unchanged  Intervention: Identify and Manage Fall Risk  Recent Flowsheet Documentation  Taken 03/18/2023 0710 by Zerita Boers, RN  Safety Interventions:   lighting adjusted for tasks/safety   low bed   nonskid shoes/slippers when out of bed   family at bedside  Intervention: Prevent Skin Injury  Recent Flowsheet Documentation  Taken 03/18/2023 0913 by Zerita Boers, RN  Positioning for Skin: Supine/Back  Device Skin Pressure Protection:   absorbent pad utilized/changed   adhesive use limited  Skin Protection: adhesive use limited  Taken 03/18/2023 0710 by Zerita Boers, RN  Positioning for Skin: Supine/Back  Device Skin Pressure Protection:   absorbent pad utilized/changed   adhesive use limited  Skin Protection: adhesive use limited  Goal: Optimal Comfort and Wellbeing  Outcome: Ongoing - Unchanged  Goal: Readiness for Transition of Care  Outcome: Ongoing - Unchanged  Goal: Rounds/Family Conference  Outcome: Ongoing - Unchanged     Problem: Pain Acute  Goal: Optimal Pain Control and Function  Outcome: Ongoing - Unchanged     Problem: Latex Allergy  Goal: Absence of Allergy Symptoms  Outcome: Ongoing - Unchanged     Problem: Oncology Care  Goal: Effective Coping  Outcome: Ongoing - Unchanged  Goal: Improved Activity Tolerance  Outcome: Ongoing - Unchanged  Intervention: Promote Improved Energy  Recent Flowsheet Documentation  Taken 03/18/2023 0710 by Zerita Boers, RN  Activity Management: ambulated in room  Goal: Optimal Oral Intake  Outcome: Ongoing - Unchanged  Goal: Improved Oral Mucous Membrane Integrity  Outcome: Ongoing - Unchanged  Intervention: Promote Oral Comfort and Health  Recent Flowsheet Documentation  Taken 03/18/2023 0710 by Zerita Boers, RN  Oral Mucous Membrane Protection:   nonirritating oral fluids promoted   nonirritating oral foods promoted  Goal: Optimal Pain Control and Function  Outcome: Ongoing - Unchanged     Problem: Infection  Goal: Absence of Infection Signs and Symptoms  Outcome: Ongoing - Unchanged     Problem: Self-Care Deficit  Goal: Improved Ability to Complete Activities of Daily Living  Outcome: Ongoing - Unchanged     Problem: Malnutrition  Goal: Improved Nutritional Intake  Outcome: Ongoing - Unchanged     Problem: Fall Injury Risk  Goal: Absence of Fall and Fall-Related Injury  Outcome: Ongoing - Unchanged  Intervention: Promote Injury-Free Environment  Recent Flowsheet Documentation  Taken 03/18/2023 0710 by Zerita Boers, RN  Safety Interventions:   lighting adjusted for tasks/safety   low bed   nonskid shoes/slippers when out of bed   family at bedside

## 2023-03-19 NOTE — Unmapped (Signed)
Pt VSS, afebrile. A/Ox4, room air, independent. Pt received all meds-tolerated well. Pt given 2x PRN dose of Oxycodone for pain, 2x PRN doses of Benadryl. No falls and all safety precautions maintained. Mother at bedside. Will CTM.     Problem: Adult Inpatient Plan of Care  Goal: Plan of Care Review  Outcome: Ongoing - Unchanged  Goal: Patient-Specific Goal (Individualized)  Outcome: Ongoing - Unchanged  Goal: Absence of Hospital-Acquired Illness or Injury  Outcome: Ongoing - Unchanged  Intervention: Identify and Manage Fall Risk  Recent Flowsheet Documentation  Taken 03/19/2023 0720 by Zerita Boers, RN  Safety Interventions:   fall reduction program maintained   family at bedside   lighting adjusted for tasks/safety   low bed   nonskid shoes/slippers when out of bed   no IV/BP/blood draw left arm  Intervention: Prevent Skin Injury  Recent Flowsheet Documentation  Taken 03/19/2023 1026 by Zerita Boers, RN  Positioning for Skin: Supine/Back  Device Skin Pressure Protection:   adhesive use limited   absorbent pad utilized/changed  Skin Protection: adhesive use limited  Taken 03/19/2023 0720 by Zerita Boers, RN  Positioning for Skin: Supine/Back  Device Skin Pressure Protection:   adhesive use limited   absorbent pad utilized/changed  Skin Protection: adhesive use limited  Intervention: Prevent Infection  Recent Flowsheet Documentation  Taken 03/19/2023 0720 by Zerita Boers, RN  Infection Prevention:   hand hygiene promoted   rest/sleep promoted   single patient room provided  Goal: Optimal Comfort and Wellbeing  Outcome: Ongoing - Unchanged  Goal: Readiness for Transition of Care  Outcome: Ongoing - Unchanged  Goal: Rounds/Family Conference  Outcome: Ongoing - Unchanged     Problem: Pain Acute  Goal: Optimal Pain Control and Function  Outcome: Ongoing - Unchanged     Problem: Latex Allergy  Goal: Absence of Allergy Symptoms  Outcome: Ongoing - Unchanged     Problem: Oncology Care  Goal: Effective Coping  Outcome: Ongoing - Unchanged  Goal: Improved Activity Tolerance  Outcome: Ongoing - Unchanged  Intervention: Promote Improved Energy  Recent Flowsheet Documentation  Taken 03/19/2023 0720 by Zerita Boers, RN  Activity Management:   ambulated in room   ambulated outside room   back to bed   up ad lib  Goal: Optimal Oral Intake  Outcome: Ongoing - Unchanged  Goal: Improved Oral Mucous Membrane Integrity  Outcome: Ongoing - Unchanged  Intervention: Promote Oral Comfort and Health  Recent Flowsheet Documentation  Taken 03/19/2023 0720 by Zerita Boers, RN  Oral Mucous Membrane Protection:   nonirritating oral fluids promoted   nonirritating oral foods promoted  Goal: Optimal Pain Control and Function  Outcome: Ongoing - Unchanged     Problem: Infection  Goal: Absence of Infection Signs and Symptoms  Outcome: Ongoing - Unchanged  Intervention: Prevent or Manage Infection  Recent Flowsheet Documentation  Taken 03/19/2023 0720 by Zerita Boers, RN  Infection Management: aseptic technique maintained  Isolation Precautions: protective precautions maintained     Problem: Self-Care Deficit  Goal: Improved Ability to Complete Activities of Daily Living  Outcome: Ongoing - Unchanged     Problem: Malnutrition  Goal: Improved Nutritional Intake  Outcome: Ongoing - Unchanged     Problem: Fall Injury Risk  Goal: Absence of Fall and Fall-Related Injury  Outcome: Ongoing - Unchanged  Intervention: Promote Injury-Free Environment  Recent Flowsheet Documentation  Taken 03/19/2023 0720 by Zerita Boers, RN  Safety Interventions:   fall reduction program maintained  family at bedside   lighting adjusted for tasks/safety   low bed   nonskid shoes/slippers when out of bed   no IV/BP/blood draw left arm

## 2023-03-19 NOTE — Unmapped (Signed)
Oncology (MEDO) Progress Note    Assessment & Plan:   [name] [name] is a 23 y.o. female whose presentation is complicated by [name] c/b desmoid fibromatomsis, recurrent ileus, GI bleeding, complex pain, PTSD secondary to chronic medical condition and multiple healthcare interactions who presented to [facility] with refractory pain in setting of large tumor burden and diarrhea secondary to pouchitis with improvement of symptoms, now weaning off IV medications.     Principal Problem:    Chronic pain due to neoplasm  Active Problems:    [name]    Generalized anxiety disorder with panic attacks    GI bleeding    MDD (major depressive disorder)    Dizziness    Fatigue    Active Problems    Acute on Chronic Generalized & Abdominal Pain - [name] Burden  Pain Related Nausea   The patient follows with [facility] pain management, most recently was seen in clinic in December 2024. It has been documented that IV opioids likely precipitate narcotic bowel syndrome and generates a positive feedback loop. Patient with multiple instances of ileus while hospitalized due to high requirement of opioids. She has been placed on an IV lidocaine drip and IV ketamine in the past for uncontrolled pain, and has been noted by her outpatient pain providers that this would be recommended for future inpatient treatments (see pain management note 02/18/23). She was evaluated by chronic pain and decided to avoid ketamine at this time as she only has one more session available before May 2025.   - Evaluated by chronic pain team, appreciate recs   - Tylenol 1000 mg 3 times daily scheduled  - Oxycodone 20 mg oral solution q4h PRN, will be given 7 day supply on discharge   - Dilaudid 1 mg once daily PRN on 1/9  - Ok to receive one dose IV on day of discharge if needed per pain team   - Butrans patch   - Lidocaine patch  - Anti-emetics:    - Zofran 8 mg q8h PO scheduled    - Phenergan 12.5 mg PO PRN, off IV     Urticaria 2/2 IV FeraHeme   Iron deficiency anemia - History of DVT   Follows with [facility]. History of anaphylaxis to IV iron and will need pre and post medication during any treatments. Most recently received FeraHeme during August 2024 hospitalization with rash/itching but overall was able to complete infusion. Ferritin 37, Tsat 13, iron 29. Iron deficit ~500 mg. Plan for IV iron this admission with pre-medication. Received IV Feraheme on 1/5. Pre-medicated with cetirizine, steroids, benadryl, famotidine as outlined in Allergy note from July 2024. She developed urticaria approximately 30 minutes into infusion but was able to complete the infusion with slowing the rate and further doses of IV benadryl and IV famotidine. Continues to have urticaria on 1/7, two days after IV iron infusion. Will treat with prednisone and PRN IV benadryl for severe itching.   - monitor Hgb daily   - continue BID cetirizine for 72 hours after IV iron infusion, completed    - prednisone 40 mg x 3 days (1/7 - 1/9)  - IV benadryl 50 mg once PRN 1/9, then PO PRN benadryl 1/10     Mild Pouchitis   Diarrhea - Hematochezia - Anastomotic ulcers   Dizziness - Fatigue - Malnutrition   Patient reports recent dizziness upon standing and generalized feeling of malaise. Notes 15-20 stools per day this week, increase from baseline of 10 stools per  day. She reports she has lost 5lbs over the past week or so. Of note, she did have her corpak removed early December due to blockage. Was on TF regimen Osmolite 1.5 as tolerated, 50-75 mL/h. No objective evidence by imaging or endoscopic findings to suggest etiology of inability to tolerate PO intake. Follows with nutrition outpatient. Suspect patient is orthostatic 2/2 poor PO intake and increased stools which are bloody in nature. She underwent endoscopy of pouch 1/3 after which she had severe left lower quadrant abdominal pain bowel movements with particles and blood which she noted were unusual for her post bowel prep. She was evaluated by GI and exam was reassuring to manage symptomatically. KUB without concern for pneumoperitoneum. Having her baseline bowel movements on 1/4.   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 [name].  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  Was most recently seen on 12/17 at which time nirogacestat was continued.  Of note, outpatient MRI was scheduled for restaging of desmoid tumor which was partially completed today. Patient reports she was only able to tolerate the abdomen and pelvis. Her pain was too great to continue.  - Notified Dr. [name] of admission  - Nirogacestat while in hospital, patient supplied and verified by pharmacy (cannot be within 2 hours of pantoprazole or famotidine)   - Outpatient MRI scans obtained inpatient on 03/11/23    Hypercalcemia (resolved)   Total calcium of 10.8. Asymptomatic. iCal normal at 4.66.    PTSD - Generalized Anxiety Disorder - Major Depressive Disorder  PTSD secondary to complex medical condition and interactions with medical system. Follows with [facility].   - continue nightly Mirtazapine 7.5 mg   - Escitalopram 5 mg daily     Issues Impacting Complexity of Management:  -Parenteral controlled medications: IV Dilaudid    Daily Checklist:  Diet: Regular Diet  DVT PPx: SCDs  Electrolytes: Replete Potassium to >/= 3.6 and Magnesium to >/= 1.8  Code Status: Full Code  Dispo: Home pending wean off IV meds    Team Contact Information:   Primary Team: Oncology (MEDO)  Primary Resident: Garnet Sierras, MD  Resident's Pager: [phone] (Oncology Intern - Alvester Morin)    Interval History:   Pain well controlled with current regimen. Still experiencing urticaria which has been relieved by as needed IV Benadryl.     Discussed with patient the plan for taper off all IV medications:  --IV benadryl once PRN 1/9 -> OFF on 1/10, oral urticaria treatment only   --IV dilaudid once PRN 1/9 -> per pain team, can get one-time additional dose of IV Dilaudid 1 mg prior to discharge on 1/10  --IV phenergan OFF on 1/9, oral anti emetics only      Patient was in agreement of plan for  weaning off IV medications as outlined above with goal discharge date of Friday 1/10.     Has follow-up appointment with [facility] on 1/13, oncologist Dr. [name] on 1/14, GI Dr. [name] 1/22.  Pain team is helping to arrange follow-up with [name] on 1/15.    Her nursing school semester starts next week and she has a meeting with leadership today to determine the best schedule for her. She only has one semester left to graduate.     Objective:   Temp:  [36.5 ??C (97.7 ??F)-36.9 ??C (98.4 ??F)] 36.8 ??C (98.2 ??F)  Heart Rate:  [57-99] 70  Resp:  [14-18] 18  BP: (100-134)/(53-90) 134/68  SpO2:  [97 %-100 %] 98 %    Gen: NAD  HENT: atraumatic, normocephalic  Heart: regular rate and rhythm   Lungs: normal work of breathing on room air   Abdomen: soft, non distended  Extremities: no edema

## 2023-03-19 NOTE — Unmapped (Signed)
Oncology (MEDO) Progress Note    Assessment & Plan:   [name] [name] is a 23 y.o. female whose presentation is complicated by [name] c/b desmoid fibromatomsis, recurrent ileus, GI bleeding, complex pain, PTSD secondary to chronic medical condition and multiple healthcare interactions who presented to [facility] with refractory pain in setting of large tumor burden and diarrhea secondary to pouchitis with improvement of symptoms, now weaning off IV medications.     Principal Problem:    Chronic pain due to neoplasm  Active Problems:    [name]    Generalized anxiety disorder with panic attacks    GI bleeding    MDD (major depressive disorder)    Dizziness    Fatigue    Active Problems    Acute on Chronic Generalized & Abdominal Pain - [name] Burden  Pain Related Nausea   The patient follows with [facility] pain management, most recently was seen in clinic in December 2024. It has been documented that IV opioids likely precipitate narcotic bowel syndrome and generates a positive feedback loop. Patient with multiple instances of ileus while hospitalized due to high requirement of opioids. She has been placed on an IV lidocaine drip and IV ketamine in the past for uncontrolled pain, and has been noted by her outpatient pain providers that this would be recommended for future inpatient treatments (see pain management note 02/18/23). She was evaluated by chronic pain and decided to avoid ketamine at this time as she only has one more session available before May 2025.   - Evaluated by chronic pain team, appreciate recs   - Tylenol 1000 mg 3 times daily scheduled  - Oxycodone 20 mg oral solution q4h PRN, encouraged patient to take regularly  - Dilaudid 1 mg TID PRN on 1/7, then BID PRN on 1/8, then once daily PRN on 1/9, then off (discussed with patient on 1/8 and she is in agreement)   - Ok to receive one dose IV on day of discharge if needed    - Butrans patch   - Lidocaine patch  - Anti-emetics:    - Zofran 8 mg q8h PO scheduled    - Phenergan 12.5 mg IVPB BID PRN on 1/7, then once daily PRN 1/8, then PO once daily PRN 1/9 (discussed with patient on 1/8 and she is in agreement)     Urticaria 2/2 IV FeraHeme   Iron deficiency anemia - History of DVT   Follows with [facility]. History of anaphylaxis to IV iron and will need pre and post medication during any treatments. Most recently received FeraHeme during August 2024 hospitalization with rash/itching but overall was able to complete infusion. Ferritin 37, Tsat 13, iron 29. Iron deficit ~500 mg. Plan for IV iron this admission with pre-medication. Received IV Feraheme on 1/5. Pre-medicated with cetirizine, steroids, benadryl, famotidine as outlined in Allergy note from July 2024. She developed urticaria approximately 30 minutes into infusion but was able to complete the infusion with slowing the rate and further doses of IV benadryl and IV famotidine. Continues to have urticaria on 1/7, two days after IV iron infusion. Will treat with prednisone and PRN IV benadryl for severe itching.   - monitor Hgb daily   - continue BID cetirizine for 72 hours after IV iron infusion.   - prednisone 40 mg x 3 days (1/7 - 1/9)  - IV benadryl 50 mg BID PRN for severe itching 1/8, once PRN 1/9, then PO PRN benadryl 1/10 (discussed with patient on 1/8  and she is in agreement)     Mild Pouchitis   Diarrhea - Hematochezia - Anastomotic ulcers   Dizziness - Fatigue - Malnutrition   Patient reports recent dizziness upon standing and generalized feeling of malaise. Notes 15-20 stools per day this week, increase from baseline of 10 stools per day. She reports she has lost 5lbs over the past week or so. Of note, she did have her corpak removed early December due to blockage. Was on TF regimen Osmolite 1.5 as tolerated, 50-75 mL/h. No objective evidence by imaging or endoscopic findings to suggest etiology of inability to tolerate PO intake. Follows with nutrition outpatient. Suspect patient is orthostatic 2/2 poor PO intake and increased stools which are bloody in nature. She underwent endoscopy of pouch 1/3 after which she had severe left lower quadrant abdominal pain bowel movements with particles and blood which she noted were unusual for her post bowel prep. She was evaluated by GI and exam was reassuring to manage symptomatically. KUB without concern for pneumoperitoneum. Having her baseline bowel movements on 1/4.   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 [name].  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  Was most recently seen on 12/17 at which time nirogacestat was continued.  Of note, outpatient MRI was scheduled for restaging of desmoid tumor which was partially completed today. Patient reports she was only able to tolerate the abdomen and pelvis. Her pain was too great to continue.  - Notified Dr. [name] of admission  - Nirogacestat while in hospital, patient supplied and verified by pharmacy (cannot be within 2 hours of pantoprazole or famotidine)   - Outpatient MRI scans obtained inpatient on 03/11/23    Hypercalcemia (resolved)   Total calcium of 10.8. Asymptomatic. iCal normal at 4.66.    PTSD - Generalized Anxiety Disorder - Major Depressive Disorder  PTSD secondary to complex medical condition and interactions with medical system. Follows with  [facility] Psychiatry.   - continue nightly Mirtazapine 7.5 mg   - Escitalopram 5 mg daily     Issues Impacting Complexity of Management:  -Parenteral controlled medications: IV Dilaudid    Daily Checklist:  Diet: Regular Diet  DVT PPx: SCDs  Electrolytes: Replete Potassium to >/= 3.6 and Magnesium to >/= 1.8  Code Status: Full Code  Dispo: Home pending wean off IV meds    Team Contact Information:   Primary Team: Oncology (MEDO)  Primary Resident: Garnet Sierras, MD  Resident's Pager: [phone] (Oncology Intern - Alvester Morin)    Interval History:   Plan had been to switch to IV benadryl once daily today 1/8 however patient noted that she was unable to sleep all night due to itching. Overnight resident had spoken with patient about some techniques to reduce scratching such a wearing long  sleeves which she will try today. Was given an additional dose of PO benadryl overnight which did not provide her with relief.     Discussed with patient the plan for taper off all IV medications:  IV benadryl BID PRN 1/8 -> once PRN 1/9 -> OFF on 1/10, oral urticaria treatment only   IV dilaudid BID PRN 1/8 -> once PRN 1/9 -> OFF on 1/10, oral pain meds only   IV phenergan once PRN 1/8 -> OFF on 1/9, oral anti emetics only     Patient was in agreement of plan for weaning off IV medications as outlined above with goal discharge date of Friday 1/10. No additional IV medications.    Discussed with patient regarding Thursday PM discharge however she would prefer to stay one extra night once off IV meds.     Objective:   Temp:  [36.4 ??C (97.5 ??F)-37.3 ??C (99.1 ??F)] 36.9 ??C (98.4 ??F)  Heart Rate:  [55-99] 99  Resp:  [16] 16  BP: (107-134)/(61-90) 132/90  SpO2:  [95 %-100 %] 99 %    Gen: NAD  HENT: atraumatic, normocephalic  Heart: regular rate and rhythm   Lungs: normal work of breathing on room air   Abdomen: soft, mildly distended  Extremities: no edema

## 2023-03-19 NOTE — Unmapped (Signed)
Pt alert and oriented. VSS, no falls and afebrile. Pt received PRN dilaudid x1, phenergan x1, benadryl x1, and oxy x1; see MAR. Safety measures remain in place with bed low, family bedside, brakes locked, side rails up, call bell in reach, non-skid footwear while out of bed.    Problem: Adult Inpatient Plan of Care  Goal: Plan of Care Review  Outcome: Ongoing - Unchanged  Goal: Patient-Specific Goal (Individualized)  Outcome: Ongoing - Unchanged  Goal: Absence of Hospital-Acquired Illness or Injury  Outcome: Ongoing - Unchanged  Intervention: Identify and Manage Fall Risk  Recent Flowsheet Documentation  Taken 03/18/2023 2045 by Dianna Limbo, RN  Safety Interventions:   low bed   lighting adjusted for tasks/safety   fall reduction program maintained   family at bedside  Intervention: Prevent Skin Injury  Recent Flowsheet Documentation  Taken 03/18/2023 2045 by Dianna Limbo, RN  Positioning for Skin: Supine/Back  Device Skin Pressure Protection: absorbent pad utilized/changed  Skin Protection: adhesive use limited  Intervention: Prevent Infection  Recent Flowsheet Documentation  Taken 03/18/2023 2045 by Dianna Limbo, RN  Infection Prevention: hand hygiene promoted  Goal: Optimal Comfort and Wellbeing  Outcome: Ongoing - Unchanged  Goal: Readiness for Transition of Care  Outcome: Ongoing - Unchanged  Goal: Rounds/Family Conference  Outcome: Ongoing - Unchanged     Problem: Pain Acute  Goal: Optimal Pain Control and Function  Outcome: Ongoing - Unchanged     Problem: Latex Allergy  Goal: Absence of Allergy Symptoms  Outcome: Ongoing - Unchanged     Problem: Oncology Care  Goal: Effective Coping  Outcome: Ongoing - Unchanged  Goal: Improved Activity Tolerance  Outcome: Ongoing - Unchanged  Intervention: Promote Improved Energy  Recent Flowsheet Documentation  Taken 03/18/2023 2045 by Dianna Limbo, RN  Activity Management:   ambulated in room   ambulated outside room  Goal: Optimal Oral Intake  Outcome: Ongoing - Unchanged  Intervention: Minimize Barriers to Oral Intake  Recent Flowsheet Documentation  Taken 03/18/2023 2045 by Dianna Limbo, RN  Oral Care: teeth brushed  Goal: Improved Oral Mucous Membrane Integrity  Outcome: Ongoing - Unchanged  Intervention: Promote Oral Comfort and Health  Recent Flowsheet Documentation  Taken 03/18/2023 2045 by Dianna Limbo, RN  Oral Mucous Membrane Protection: nonirritating oral fluids promoted  Oral Care: teeth brushed  Goal: Optimal Pain Control and Function  Outcome: Ongoing - Unchanged     Problem: Infection  Goal: Absence of Infection Signs and Symptoms  Outcome: Ongoing - Unchanged  Intervention: Prevent or Manage Infection  Recent Flowsheet Documentation  Taken 03/18/2023 2045 by Dianna Limbo, RN  Infection Management: aseptic technique maintained  Isolation Precautions: protective precautions maintained     Problem: Self-Care Deficit  Goal: Improved Ability to Complete Activities of Daily Living  Outcome: Ongoing - Unchanged     Problem: Malnutrition  Goal: Improved Nutritional Intake  Outcome: Ongoing - Unchanged     Problem: Fall Injury Risk  Goal: Absence of Fall and Fall-Related Injury  Outcome: Ongoing - Unchanged  Intervention: Promote Injury-Free Environment  Recent Flowsheet Documentation  Taken 03/18/2023 2045 by Dianna Limbo, RN  Safety Interventions:   low bed   lighting adjusted for tasks/safety   fall reduction program maintained   family at bedside

## 2023-03-20 DIAGNOSIS — D48119 Desmoid tumor: Principal | ICD-10-CM

## 2023-03-20 MED ORDER — MIRTAZAPINE 7.5 MG TABLET
ORAL_TABLET | Freq: Every evening | ORAL | 0 refills | 30.00 days | Status: CP
Start: 2023-03-20 — End: ?
  Filled 2023-03-20: qty 30, 30d supply, fill #0

## 2023-03-20 MED ORDER — DIPHENHYDRAMINE 25 MG TABLET
ORAL_TABLET | Freq: Two times a day (BID) | ORAL | 0 refills | 25.00 days | Status: CP | PRN
Start: 2023-03-20 — End: 2023-04-14
  Filled 2023-03-20: qty 100, 25d supply, fill #0

## 2023-03-20 MED ORDER — CETIRIZINE 10 MG TABLET
ORAL_TABLET | Freq: Two times a day (BID) | ORAL | 0 refills | 7.00 days | Status: CP
Start: 2023-03-20 — End: 2023-03-27
  Filled 2023-03-20: qty 28, 7d supply, fill #0

## 2023-03-20 MED ORDER — ONDANSETRON 8 MG DISINTEGRATING TABLET
ORAL_TABLET | Freq: Three times a day (TID) | 0 refills | 30.00 days | Status: CP | PRN
Start: 2023-03-20 — End: ?
  Filled 2023-03-20: qty 90, 30d supply, fill #0

## 2023-03-20 MED ORDER — CEFDINIR 300 MG CAPSULE
ORAL_CAPSULE | ORAL | 0 refills | 47.00 days | Status: CP
Start: 2023-03-20 — End: 2023-05-06
  Filled 2023-03-20: qty 66, 47d supply, fill #0

## 2023-03-20 MED ORDER — DIPHENHYDRAMINE 50 MG CAPSULE
ORAL_CAPSULE | Freq: Every day | ORAL | 0 refills | 7.00 days | Status: CN | PRN
Start: 2023-03-20 — End: 2023-03-27

## 2023-03-20 MED ORDER — PANTOPRAZOLE 40 MG TABLET,DELAYED RELEASE
ORAL_TABLET | Freq: Every day | ORAL | 0 refills | 30 days | Status: CP
Start: 2023-03-20 — End: 2023-04-19
  Filled 2023-03-20: qty 30, 30d supply, fill #0

## 2023-03-20 MED ORDER — PREDNISONE 20 MG TABLET
ORAL_TABLET | Freq: Every day | ORAL | 0 refills | 4.00 days | Status: CP
Start: 2023-03-20 — End: 2023-03-24
  Filled 2023-03-20: qty 8, 4d supply, fill #0

## 2023-03-20 MED ORDER — OXYCODONE 5 MG/5 ML ORAL SOLUTION
ORAL | 0 refills | 7.00 days | Status: CP | PRN
Start: 2023-03-20 — End: 2023-03-27
  Filled 2023-03-20: qty 840, 7d supply, fill #0

## 2023-03-20 MED ORDER — PROMETHAZINE 12.5 MG TABLET
ORAL_TABLET | Freq: Two times a day (BID) | ORAL | 0 refills | 7.00 days | Status: CP | PRN
Start: 2023-03-20 — End: ?
  Filled 2023-03-20: qty 14, 7d supply, fill #0

## 2023-03-20 MED ORDER — ESCITALOPRAM 5 MG TABLET
ORAL_TABLET | Freq: Every day | ORAL | 0 refills | 30.00 days | Status: CP
Start: 2023-03-20 — End: ?
  Filled 2023-03-20: qty 30, 30d supply, fill #0

## 2023-03-20 MED ADMIN — oxyCODONE (ROXICODONE) 5 mg/5 mL solution 20 mg: 20 mg | ORAL | @ 05:00:00 | Stop: 2023-03-20

## 2023-03-20 MED ADMIN — cefdinir (OMNICEF) capsule 300 mg: 300 mg | ORAL | @ 03:00:00 | Stop: 2023-03-24

## 2023-03-20 MED ADMIN — oxyCODONE (ROXICODONE) 5 mg/5 mL solution 20 mg: 20 mg | ORAL | @ 01:00:00 | Stop: 2023-03-21

## 2023-03-20 MED ADMIN — ondansetron (ZOFRAN-ODT) disintegrating tablet 8 mg: 8 mg | ORAL | @ 11:00:00 | Stop: 2023-03-20

## 2023-03-20 MED ADMIN — sodium chloride (NS) 0.9 % flush 10 mL: 10 mL | INTRAVENOUS | @ 01:00:00

## 2023-03-20 MED ADMIN — ondansetron (ZOFRAN-ODT) disintegrating tablet 8 mg: 8 mg | ORAL | @ 03:00:00

## 2023-03-20 MED ADMIN — acetaminophen (TYLENOL) tablet 1,000 mg: 1000 mg | ORAL | @ 03:00:00

## 2023-03-20 MED ADMIN — nirogacestat (OGSIVEO) tablet 150 mg: 150 mg | ORAL | @ 15:00:00 | Stop: 2023-03-20

## 2023-03-20 MED ADMIN — sodium chloride (NS) 0.9 % flush 10 mL: 10 mL | INTRAVENOUS | @ 14:00:00 | Stop: 2023-03-20

## 2023-03-20 MED ADMIN — promethazine (PHENERGAN) tablet 12.5 mg: 12.5 mg | ORAL | @ 03:00:00

## 2023-03-20 MED ADMIN — nirogacestat (OGSIVEO) tablet 150 mg: 150 mg | ORAL | @ 01:00:00

## 2023-03-20 MED ADMIN — famotidine (PEPCID) tablet 20 mg: 20 mg | ORAL | @ 03:00:00

## 2023-03-20 MED ADMIN — escitalopram oxalate (LEXAPRO) tablet 5 mg: 5 mg | ORAL | @ 14:00:00 | Stop: 2023-03-20

## 2023-03-20 MED ADMIN — cetirizine (ZYRTEC) tablet 20 mg: 20 mg | ORAL | @ 14:00:00 | Stop: 2023-03-20

## 2023-03-20 MED ADMIN — HYDROmorphone (PF) (DILAUDID) injection 1 mg: 1 mg | INTRAVENOUS | @ 16:00:00 | Stop: 2023-03-20

## 2023-03-20 MED ADMIN — cefdinir (OMNICEF) capsule 300 mg: 300 mg | ORAL | @ 14:00:00 | Stop: 2023-03-20

## 2023-03-20 MED ADMIN — acetaminophen (TYLENOL) tablet 1,000 mg: 1000 mg | ORAL | @ 11:00:00 | Stop: 2023-03-20

## 2023-03-20 MED ADMIN — HYDROmorphone (PF) (DILAUDID) injection 1 mg: 1 mg | INTRAVENOUS | @ 03:00:00 | Stop: 2023-03-21

## 2023-03-20 MED ADMIN — oxyCODONE (ROXICODONE) 5 mg/5 mL solution 20 mg: 20 mg | ORAL | @ 11:00:00 | Stop: 2023-03-20

## 2023-03-20 MED ADMIN — naloxegol (MOVANTIK) tablet 12.5 mg: 12.5 mg | ORAL | @ 14:00:00 | Stop: 2023-03-20

## 2023-03-20 NOTE — Unmapped (Signed)
Pt A and O x4. Up ad lib. Complained of abdomen pain, given prn oxycodone twice. Prn IV dilaudid given once for uncontrolled pain. Prn phenergan given for nausea. Cont pulse ox. Family at bedside. Free from falls and injuries. Updated POC prn.         Problem: Adult Inpatient Plan of Care  Goal: Plan of Care Review  Outcome: Ongoing - Unchanged  Goal: Patient-Specific Goal (Individualized)  Outcome: Ongoing - Unchanged  Goal: Absence of Hospital-Acquired Illness or Injury  Outcome: Ongoing - Unchanged  Intervention: Identify and Manage Fall Risk  Recent Flowsheet Documentation  Taken 03/19/2023 2130 by Gerre Pebbles, RN  Safety Interventions:   low bed   lighting adjusted for tasks/safety   fall reduction program maintained   family at bedside   nonskid shoes/slippers when out of bed  Intervention: Prevent Skin Injury  Recent Flowsheet Documentation  Taken 03/20/2023 0000 by Gerre Pebbles, RN  Positioning for Skin: Standing  Taken 03/19/2023 2345 by Gerre Pebbles, RN  Positioning for Skin: Supine/Back  Device Skin Pressure Protection: absorbent pad utilized/changed  Skin Protection: adhesive use limited  Taken 03/19/2023 2130 by Gerre Pebbles, RN  Positioning for Skin: Supine/Back  Device Skin Pressure Protection: adhesive use limited  Skin Protection: adhesive use limited  Intervention: Prevent Infection  Recent Flowsheet Documentation  Taken 03/19/2023 2130 by Gerre Pebbles, RN  Infection Prevention: hand hygiene promoted  Goal: Optimal Comfort and Wellbeing  Outcome: Ongoing - Unchanged  Goal: Readiness for Transition of Care  Outcome: Ongoing - Unchanged  Goal: Rounds/Family Conference  Outcome: Ongoing - Unchanged     Problem: Latex Allergy  Goal: Absence of Allergy Symptoms  Outcome: Ongoing - Unchanged     Problem: Oncology Care  Goal: Effective Coping  Outcome: Ongoing - Unchanged  Goal: Improved Activity Tolerance  Outcome: Ongoing - Unchanged  Goal: Optimal Oral Intake  Outcome: Ongoing - Unchanged  Goal: Improved Oral Mucous Membrane Integrity  Outcome: Ongoing - Unchanged  Intervention: Promote Oral Comfort and Health  Recent Flowsheet Documentation  Taken 03/19/2023 2130 by Gerre Pebbles, RN  Oral Mucous Membrane Protection:   nonirritating oral fluids promoted   nonirritating oral foods promoted  Goal: Optimal Pain Control and Function  Outcome: Ongoing - Unchanged     Problem: Infection  Goal: Absence of Infection Signs and Symptoms  Outcome: Ongoing - Unchanged  Intervention: Prevent or Manage Infection  Recent Flowsheet Documentation  Taken 03/19/2023 2130 by Gerre Pebbles, RN  Infection Management: aseptic technique maintained  Isolation Precautions: protective precautions maintained     Problem: Self-Care Deficit  Goal: Improved Ability to Complete Activities of Daily Living  Outcome: Ongoing - Unchanged     Problem: Malnutrition  Goal: Improved Nutritional Intake  Outcome: Ongoing - Unchanged     Problem: Fall Injury Risk  Goal: Absence of Fall and Fall-Related Injury  Outcome: Ongoing - Unchanged  Intervention: Promote Injury-Free Environment  Recent Flowsheet Documentation  Taken 03/19/2023 2130 by Gerre Pebbles, RN  Safety Interventions:   low bed   lighting adjusted for tasks/safety   fall reduction program maintained   family at bedside   nonskid shoes/slippers when out of bed

## 2023-03-20 NOTE — Unmapped (Signed)
Patient is here for Pain, nausea, diarrhea,  Patient is being discharged home.     The after-visit summary (AVS) was reviewed with patient. The following was reviewed with the patient: an overview of their upcoming appointments, specific discharge instructions, general cancer information, triage stop light education for post discharge issues, opioid/antibiotic education, and crisis information. The patient stated an understanding of the information provided in the AVS. All questions were addressed with the provider prior to discharge.     Patient Specific Information Reviewed: Patient alert and oriented X4; Patient Independent with wheelchair    Pharmacy: Medications were delivered at bedside by [facility]     Patient supplied chemo was given to patient for discharge purposes.     Vascular Access: Patient was de-accessed. PIVs    Personal belongings gathered and patient independently exited the unit upon discharge.       Problem: Adult Inpatient Plan of Care  Goal: Plan of Care Review  Outcome: Discharged to Home  Goal: Patient-Specific Goal (Individualized)  Outcome: Discharged to Home  Goal: Absence of Hospital-Acquired Illness or Injury  Outcome: Discharged to Home  Intervention: Identify and Manage Fall Risk  Recent Flowsheet Documentation  Taken 03/20/2023 0745 by Phineas Douglas, RN  Safety Interventions:   low bed   lighting adjusted for tasks/safety   fall reduction program maintained   family at bedside   nonskid shoes/slippers when out of bed  Intervention: Prevent Skin Injury  Recent Flowsheet Documentation  Taken 03/20/2023 0745 by Phineas Douglas, RN  Positioning for Skin: Supine/Back  Device Skin Pressure Protection:   adhesive use limited   pressure points protected  Skin Protection:   adhesive use limited   cleansing with dimethicone incontinence wipes   incontinence pads utilized   transparent dressing maintained   tubing/devices free from skin contact   protective footwear used  Intervention: Prevent Infection  Recent Flowsheet Documentation  Taken 03/20/2023 0745 by Phineas Douglas, RN  Infection Prevention:   cohorting utilized   environmental surveillance performed   equipment surfaces disinfected   hand hygiene promoted   personal protective equipment utilized   rest/sleep promoted   single patient room provided   visitors restricted/screened  Goal: Optimal Comfort and Wellbeing  Outcome: Discharged to Home  Goal: Readiness for Transition of Care  Outcome: Discharged to Home  Goal: Rounds/Family Conference  Outcome: Discharged to Home     Problem: Pain Acute  Goal: Optimal Pain Control and Function  Outcome: Discharged to Home     Problem: Latex Allergy  Goal: Absence of Allergy Symptoms  Outcome: Discharged to Home  Intervention: Maintain Latex-Aware Environment  Recent Flowsheet Documentation  Taken 03/20/2023 0745 by Phineas Douglas, RN  Latex Precautions: room checked for presence of latex     Problem: Oncology Care  Goal: Effective Coping  Outcome: Discharged to Home  Goal: Improved Activity Tolerance  Outcome: Discharged to Home  Intervention: Promote Improved Energy  Recent Flowsheet Documentation  Taken 03/20/2023 0745 by Phineas Douglas, RN  Activity Management: up ad lib  Goal: Optimal Oral Intake  Outcome: Discharged to Home  Goal: Improved Oral Mucous Membrane Integrity  Outcome: Discharged to Home  Intervention: Promote Oral Comfort and Health  Recent Flowsheet Documentation  Taken 03/20/2023 0745 by Phineas Douglas, RN  Oral Mucous Membrane Protection:   nonirritating oral fluids promoted   lip/mouth moisturizer applied   nonirritating oral foods promoted  Goal: Optimal Pain Control and Function  Outcome: Discharged to Home     Problem:  Infection  Goal: Absence of Infection Signs and Symptoms  Outcome: Discharged to Home  Intervention: Prevent or Manage Infection  Recent Flowsheet Documentation  Taken 03/20/2023 0745 by Phineas Douglas, RN  Infection Management: aseptic technique maintained  Isolation Precautions: protective precautions maintained     Problem: Self-Care Deficit  Goal: Improved Ability to Complete Activities of Daily Living  Outcome: Discharged to Home     Problem: Malnutrition  Goal: Improved Nutritional Intake  Outcome: Discharged to Home     Problem: Fall Injury Risk  Goal: Absence of Fall and Fall-Related Injury  Outcome: Discharged to Home  Intervention: Promote Injury-Free Environment  Recent Flowsheet Documentation  Taken 03/20/2023 0745 by Phineas Douglas, RN  Safety Interventions:   low bed   lighting adjusted for tasks/safety   fall reduction program maintained   family at bedside   nonskid shoes/slippers when out of bed

## 2023-03-20 NOTE — Unmapped (Signed)
Physician Discharge Summary [facility]  4 ONC [facility]  [address]  [place] [address]  Dept: [phone]  Loc: [phone]     Identifying Information:   [name] [name]  May 19, 2000  [id number]    Primary Care Physician: Jarold Motto, [facility]     Code Status: Full Code    Admit Date: 03/08/2023    Discharge Date: 03/20/2023      Discharge To: Home    Discharge Service: [facility] - Oncology Floor Team ([facility])     Discharge Attending Physician: No att. providers found    Discharge Diagnoses:   Principal Problem:    Chronic pain due to neoplasm (POA: Yes)  Active Problems:    [name] (POA: Yes)    Intractable abdominal pain (POA: Yes)    Generalized anxiety disorder with panic attacks (POA: Yes)    MDD (major depressive disorder) (POA: Yes)    Hypersensitivity reaction (POA: Yes)    Iron deficiency (POA: Yes)    Dizziness (POA: Unknown)    Fatigue (POA: Unknown)    Pouchitis (CMS-HCC) (POA: Yes)  Resolved Problems:    GI bleeding (POA: Yes)      Hospital Course:   [name] [name] is a 23 y.o. 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 Hospital course by problem as below.     Pouchitis  Diarrhea - Hematochezia - Weight Loss   Increased bowel frequency up to 15-20 times daily with associated day time incontinence, urgency, and hematochezia. Hgb mildly decreased from baseline. This is reminiscent of previous episodes of pouchitis. Started cefdinir with improvement of symptoms. Pouchoscopy 03/13/23 revealed intact pouch with minimal inflammation and several superficial ulcerations at the pouch anastamosis and pre-pouch inlet, suggestive of healing pouchitis. Biopsies with moderately active chronic enteritis, no CMV, granuloma, or dysplasia (consistent with pouchitis). She developed severe LLQ abdominal pain following endoscopy which slowly resolved independently, somewhat suspicious for gaseous distention following procedure. Pain management described below. Bowel movements became less frequent and more formed and she was restarted on Movantik prior to discharge. Discharged on Cefdinir 300 mg BID (1/1-1/28), then 300 mg daily (1/29-2/25) with Movantik as needed for constipation. She has GI follow up on 1/22.     Acute on Chronic Generalized & Abdominal Pain  [name] Burden  Pain Related Nausea   The patient follows with [facility] pain management, most recently was seen in clinic in December 2024. It has been documented that IV opioids likely precipitate narcotic bowel syndrome and generates a positive feedback loop. Patient with multiple instances of ileus while hospitalized due to high requirement of opioids. She has been placed on an IV lidocaine drip and IV ketamine in the past for uncontrolled pain, and has been noted by her outpatient pain providers that this would be recommended for future inpatient treatments (see pain management note 02/18/23). She was evaluated by chronic pain and decided to avoid ketamine at this time as she only has one more session available before May 2025. Pain was managed with Buprenorphine patch 20 mcg/hour, PO Tylenol 1000 mg 3 times daily scheduled, PO Oxycodone 20 mg oral solution q4h PRN, and IV Dilaudid 1 mg PRN weaned  down gradually. Nausea managed initially with IV Zofran and IV Phenergan, then eventually transitioned to discharge regimen of PO Zofran q8h and PO Phenergan PRN. Appt with pain medicine [name] scheduled for 1/15.     Iron Deficiency - History of DVT   Urticaria 2/2 IV FeraHeme   Follows with [facility] Hematology. History of anaphylaxis to IV iron and will need pre and post medication during any treatments. Most recently received FeraHeme during August 2024 hospitalization with rash/itching but overall was able to complete infusion. Ferritin 37, Tsat 13, iron 29. Iron deficit ~500 mg. Received IV Feraheme on 1/5. Pre-medicated with cetirizine, steroids, benadryl, famotidine as outlined in Allergy note from July 2024. She developed urticaria approximately 30 minutes into infusion but was able to complete the infusion with slowing the rate and further doses of IV benadryl and IV famotidine. Urticaria continued and she had a slow wean off IV benadryl over the next 4 days. Treated with 7-day course of prednisone 40 mg due to prolonged reaction. For future IV iron infusions, please let charge RN know 4-5 days prior to increase staffing as patient requires frequent PRNs for infusion reaction. Encouraged patient to schedule allergy follow up given her prolonged reaction.     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 [name].  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  Was most recently seen on 12/17 at which time nirogacestat was continued.  Of note, outpatient MRI was scheduled for restaging of desmoid tumor which was partially completed on day of admission but discontinued due to inability to tolerate pain. Remaining scans obtained 03/11/23. Nirogacestat was continued while in the hospital. Appt with oncologist Dr. [name] scheduled for 1/14.     PTSD - Generalized Anxiety Disorder - Major Depressive Disorder  PTSD secondary to complex medical condition and interactions with medical system. Follows with [facility] Psychiatry and was seen inpatient by AYA NP and Consult Liaison Psychologist. Continued on Mirtazapine 7.5 mg and Escitalopram 5 mg daily. Appt with AYA scheduled for 1/13.     Nutrition Assessment:   Non-severe (Moderate) Protein-Calorie Malnutrition in the context of chronic illness (03/10/23 1204)  Energy Intake: < 75% of estimated energy requirement for > or equal to 1 month  Interpretation of Wt. Loss: > or equal to 7.5% x 3 month  Fat Loss: Clinical criterion not met  Muscle Loss: Clinical criterion not met  Malnutrition Score: 2    Outpatient Provider Follow Up Issues:   [ ]  Pain: Outpatient pain regimen  [ ]  Oncology:Please discuss MRI scans  [ ]  GI: please discuss pouchitis, will end cefdinir on 2/25     Touchbase with Outpatient Provider:  Warm Handoff: Completed on 03/20/23 by Grant Fontana, MD  (Resident) via [facility] Message    Procedures:  Pouch endoscopy 03/13/23   ______________________________________________________________________  Discharge Medications:      Your Medication List        STOP taking these medications      oxyCODONE 10 mg immediate release tablet  Commonly known as: ROXICODONE  Replaced by: oxyCODONE 5 mg/5 mL solution            START taking these medications      Allergy (diphenhydrAMINE) 25 mg tablet  Generic drug: diphenhydrAMINE  Take 2 tablets (50 mg total) by  mouth two (2) times a day as needed for itching for up to 7 days.     cefdinir 300 MG capsule  Commonly known as: OMNICEF  Take 1 capsule (300 mg total) by mouth every twelve (12) hours for 19 days, THEN 1 capsule (300 mg total) daily for 28 days.  Start taking on: March 20, 2023     cetirizine 10 MG tablet  Commonly known as: ZYRTEC  Take 2 tablets (20 mg total) by mouth two (2) times a day for 7 days.     oxyCODONE 5 mg/5 mL solution  Commonly known as: ROXICODONE  Take 20 mL (20 mg total) by mouth every four (4) hours as needed for up to 7 days.  Replaces: oxyCODONE 10 mg immediate release tablet     pantoprazole 40 MG tablet  Commonly known as: Protonix  Take 1 tablet (40 mg total) by mouth daily before breakfast. predniSONE 20 MG tablet  Commonly known as: DELTASONE  Take 2 tablets (40 mg total) by mouth daily for 4 days.            CHANGE how you take these medications      acetaminophen 500 MG tablet  Commonly known as: TYLENOL  Take 2 tablets (1,000 mg total) by mouth every eight (8) hours.  What changed:   how much to take  when to take this  reasons to take this     fluticasone propionate 50 mcg/actuation nasal spray  Commonly known as: FLONASE  Use 1 spray under the butrans patch once every 7 days to prevent allergic reaction to the adhesive  What changed:   how much to take  how to take this  when to take this  additional instructions     mirtazapine 7.5 MG tablet  Commonly known as: REMERON  Take 1 tablet (7.5 mg total) by mouth nightly.  What changed:   medication strength  how much to take     ondansetron 8 MG disintegrating tablet  Commonly known as: ZOFRAN-ODT  Dissolve 1 tablet (8 mg total) in the mouth every eight (8) hours as needed for nausea.  What changed: when to take this     promethazine 12.5 MG tablet  Commonly known as: PHENERGAN  Take 1 tablet (12.5 mg total) by mouth every twelve (12) hours as needed for nausea.  What changed: when to take this            CONTINUE taking these medications      buprenorphine 20 mcg/hour Ptwk transdermal patch  Commonly known as: BUTRANS  Place 1 patch on the skin every seven (7) days.     cholecalciferol (vitamin D3 25 mcg (1,000 units)) 1,000 unit (25 mcg) tablet  Take 1 tablet (25 mcg total) by mouth daily.     COURIERED MED OR SUPPLY  OGSVEO 50 MG TABLET.     escitalopram oxalate 5 MG tablet  Commonly known as: LEXAPRO  Take 1 tablet (5 mg total) by mouth daily.     famotidine 20 MG tablet  Commonly known as: PEPCID  Take 1 tablet (20 mg total) by mouth nightly.     hydrocortisone 2.5 % cream  Apply topically two (2) times a day as needed (hemorrhoids).     lidocaine 4 % patch  Commonly known as: ASPERCREME  Place 1 patch on the skin daily as needed. LORazepam 0.5 MG tablet  Commonly known as: ATIVAN  Take 2 tablets (1 mg total) by mouth Three (3)  times a day as needed for anxiety.     naloxegol 12.5 mg tablet  Commonly known as: MOVANTIK  Take 1 tablet (12.5 mg total) by mouth daily.     naloxone 4 mg/actuation nasal spray  Commonly known as: NARCAN  One spray in either nostril once for known/suspected opioid overdose. May repeat every 2-3 minutes in alternating nostril til EMS arrives     OGSIVEO 50 mg tablet  Generic drug: nirogacestat  Take 3 tablets (150 mg total) by mouth two (2) times a day. Swallow tablets whole; do not break, crush, or chew.     pimecrolimus 1 % cream  Commonly known as: ELIDEL  Apply topically two (2) times a day as needed. To face as needed for rash     scopolamine 1 mg over 3 days  Commonly known as: TRANSDERM-SCOP  Place 1 patch (1 mg total) on the skin every third day.     sucralfate 100 mg/mL suspension  Commonly known as: CARAFATE  Take 10 mL (1 g total) by mouth four (4) times a day as needed.     triamcinolone 0.1 % cream  Commonly known as: KENALOG  Apply topically two (2) times a day as needed. Apply to rash as needed     zinc oxide-cod liver oil 40 % Pste  Commonly known as: DESITIN 40%  Apply topically daily as needed.              Allergies:  Adhesive tape-silicones; Ferrlecit [sodium ferric gluconat-sucrose]; Levofloxacin; Methylnaltrexone; Neomycin; Papaya; Morphine; Zosyn [piperacillin-tazobactam]; Compazine [prochlorperazine]; Iron analogues; Reglan [metoclopramide hcl]; Iron dextran; and Latex, natural rubber  ______________________________________________________________________  Pending Test Results:      Most Recent Labs:  All lab results last 24 hours - No results found for this or any previous visit (from the past 24 hours).    Relevant Studies/Radiology:  ECG 12 Lead  Result Date: 03/15/2023  NORMAL SINUS RHYTHM NORMAL ECG WHEN COMPARED WITH ECG OF 08-Mar-2023 23:46, NO SIGNIFICANT CHANGE WAS FOUND Confirmed by Schuyler Amor [phone]) on 03/15/2023 2:59:17 PM    XR Abdomen 1 View  Result Date: 03/14/2023  EXAM: XR ABDOMEN 1 VIEW ACCESSION: [id number] UN REPORT DATE: 03/14/2023 6:21 PM CLINICAL INDICATION: 23 years old with ABDOMINAL PAIN   -  LEFT LOWER QUADRANT  COMPARISON: 03/08/2023 MRI of the abdomen and pelvis TECHNIQUE: Supine view of the abdomen. FINDINGS: Unremarkable lung bases. Mild gaseous distention of small bowel in the pelvis, similar to prior. Pelvic anastomotic suture.     Mild gaseous distention of pelvic small bowel, stable.    External ECG-3 days to 7 days (ZIO XT)  Ambulatory ECG Report Patient: Lluliana Ferrell MRN: [id number] Referring:  Sheral Apley, MD Interpreting provider: Dimple Nanas, MD, MD  Indication: Palpitations Patient had a min HR of 52 bpm, max HR of 172 bpm, and avg HR of 80 bpm. Predominant underlying rhythm was Sinus Rhythm. Slight P wave morphology changes were noted. Ectopic Atrial Rhythm was present. Ectopic Atrial Rhythm was detected within +/- 45 seconds of symptomatic patient event(s). Isolated SVEs were rare (<1.0%), SVE Couplets were rare (<1.0%), and no SVE Triplets were present. Isolated VEs were rare (<1.0%), and no VE Couplets or VE Triplets were present. Conclusions: Agree with above findings. Symptoms correlate with sinus rhythm, ectopy Signed electronically by: Dimple Nanas, MD March 19, 2023 10:26 AM (Details and rhythm strips are available for review in the linked PDF document)     Pouch Exam  Result Date: 03/13/2023  _______________________________________________________________________________ Patient Name: [name] [name]         Procedure Date: 03/13/2023 7:32 AM MRN: [id number]                     Date of Birth: 26-Oct-2000 Admit Type: Inpatient                 Age: 52 Room: [facility] OR 02 [facility]          Gender: Female Note Status: Finalized                Instrument Name: [id number] _______________________________________________________________________________  Procedure:             Pouchoscopy Indications:           Follow-up endoscopy after surgery Providers:             [facility] Joellen Jersey, MD, Chesley Noon, MD (Fellow),                        Allegra Lai, Damita Lack, RN Patient Profile:       22 yo hospitalized with frequent diarrhea (up to                        20x/day) and bright red blood per rectum. Referring MD:          Medicines:             Propofol per Anesthesia Complications:         No immediate complications. _______________________________________________________________________________ Procedure:             After obtaining informed consent, the endoscope was                        passed under direct vision. Throughout the procedure,                        the patient's blood pressure, pulse, and oxygen                        saturations were monitored continuously. The Endoscope                        was introduced through the ileoanal anastomosis via                        the anus and advanced to the the J-pouch. The                        procedure was performed without difficulty. The                        patient tolerated the procedure well. The quality of                        the bowel preparation was good.  Findings:      The perianal and digital rectal examinations were normal.      The rectal cuff appeared normal apart from small irritation/ ulcer (2mm)      at the site of a suture.      Patient is status-post total colectomy with an ileal pouch-anal      anastomosis.      There was erythema of the pouch anastamosis with two very small (2-30mm)      anastamotic ulcers.      The pouch appeared normal. There were 2-3 superficial ulcers in the      pre-pouch inlet. Biopsies were taken with a cold forceps for histology.      The pre-pouch ileum appeared normal to extent of exam. Estimated Blood Loss:      Estimated blood loss was minimal. Impression:            - Minimal pouchitis and anastamotic ulceration noted,                        concerning for healing pouchitis as cause for                        patient's symptoms. Recommendation:        - continue cefdinir                        - monitor stool output and continued rectal bleeding.                        - Resume previous diet.                        - Continue present medications.                        - Await pathology results.                        - Further recommendations can be made by the GI                        consult team. Please contact them if necessary.                                                                                Procedure Code(s):     --- Professional ---                        [phone], Endoscopic evaluation of small intestinal pouch                        (eg, Kock pouch, ileal reservoir [S or J]); with                        biopsy, single or multiple Diagnosis Code(s):     --- Professional ---  Z09, Encounter for follow-up examination after                        completed treatment for conditions other than                        malignant neoplasm CPT copyright 2023 American [employer] Association. All rights reserved. The codes documented in this report are preliminary and upon coder review may be revised to meet current compliance requirements. Electronically Signed By Aleatha Borer. MD ______________________ Carmon Ginsberg, MD 03/13/2023 8:54:48 AM The attending physician was present throughout the entire procedure including the insertion, viewing, and removal of the endoscope. This procedure note has been electronically signed by: Aleatha Borer , MD ___________________ Chesley Noon, MD Number of Addenda: 0 Note Initiated On: 03/13/2023 7:32 AM    MRI Lower Extremity Non-Joint Bilateral w/wo Contrast  Result Date: 03/12/2023  EXAM: MRI LOWER EXTREMITY NON-JOINT BILATERAL W WO CONTRAST DATE: 03/11/2023 12:14 PM ACCESSION: [id number] UN DICTATED: 03/11/2023 2:39 PM INTERPRETATION LOCATION: MAIN CAMPUS CLINICAL INDICATION: 23 years old Female with Monitor known desmoid tumors on left, assess for tumors on right d/t pt symptomatic  COMPARISON: 03/08/2023, 11/11/2022, prior MRI lower extremity dated 08/24/2021. TECHNIQUE: MRI of both thighs was performed before and after administration of IV contrast (Gadopiclenol-5 ml] using the body multicoil array covering the lower extremity from the hips to the knees.  Multisequence, multiplanar large field of view images were obtained. FINDINGS: Bones: No acute fracture or osseous erosion. No dislocation. The bone marrow signal intensity is mildly heterogeneous but there is no focal osseous lesion. Muscles: Previously visualized circumferential low signal fascial thickening along the left quadriceps muscle predominantly along the rectus femoris measures up to 9 mm in thickness, similar to prior. No abnormal enhancement. No new muscle mass or lesion. Soft tissues: Redemonstration of T1 hypointense, T2 hyperintense ovoid subcutaneous cystic lesions within the right anterior and lateral thigh. They currently measure 15 mm and 13 mm respectively, which is slightly increased from prior 13 mm and 11 mm. They demonstrate mild peripheral enhancement. There are numerous additional smaller subcentimeter superficial subcutaneous cystic nodules throughout the lower extremities. Hips and knees are approximated on the large field-of-view images. The neurovascular bundles are normal in course and contour     Both thighs: 1.  Circumferential fascial thickening involving the left quadriceps muscle, predominantly the rectus femoris, suggestive of fibromatosis, appears similar in appearance with respect to prior. 2.  Slight interval increase in the size of the largest subcutaneous cystic nodules in the right anterior and lateral thigh since 2023, most likely epidermal inclusion cysts given history. There are multiple additional smaller cysts throughout both lower extremities which appear slightly increased in size and number since 2023.    MRI Chest W Wo Contrast MSK  Result Date: 03/12/2023  EXAM: MRI CHEST W WO CONTRAST MSK DATE: 03/11/2023 12:14 PM ACCESSION: [id number] UN DICTATED: 03/11/2023 3:21 PM INTERPRETATION LOCATION: MAIN CAMPUS CLINICAL INDICATION: 23 years old Female with Restaging desmoid tumor of the shoulder  COMPARISON: Prior CT chest abdomen dated 09/16/2022. Prior MRI chest dated 11/21/2021. TECHNIQUE: MRI of the chest was performed before and after administration of IV contrast [Gadopiclenol-5 ml] using the body multicoil array.  Multisequence, multiplanar large field of view images were obtained. FINDINGS: Bones: No acute fracture or bone marrow edema. The bone marrow signal intensity appears within normal limits. Muscles: The muscles appear normal in signal intensity, morphology and symmetry. Soft  tissues: Redemonstration of multiple T1, T2 hypointense, chest wall lesions not showing any post contrast enhancement: 1.  Left pectoralis major lesion measures approximately 1.1X 0.5 cm [21:30], previously measured 1.4X 0.5 cm. 2.  Right subpectoral lesion measuring 2.4X 0.8 cm [21:31], previously measured 2.8X 0.8 cm. 3.  Lesion posterior to the right posterior fourth rib, measures 1.4X 1 cm [21:26], previously measured 1.7X 1.1 cm. 4.  Previously visualized left paraspinal lesion is ill-defined but appears unchanged (21:27). 5.  Lesion near the midline back, measures approximately 1.7X 0.6 cm [21:65], same as prior. Small inclusion cysts along the superficial posterior soft tissues.     Previously visualized multiple thoracic heterogeneous low signal chest wall lesions, as described in the findings, with a few demonstrating interval decrease in size and the remainder remaining stable in size. No new lesions are seen.     ECG 12 Lead  Result Date: 03/09/2023  NORMAL SINUS RHYTHM NONSPECIFIC T WAVE ABNORMALITY ABNORMAL ECG WHEN COMPARED WITH ECG OF 02-Nov-2022 08:33, NONSPECIFIC T WAVE ABNORMALITY NOW EVIDENT IN ANTERIOR LEADS Confirmed by Pollyann Kennedy ([id number]) on 03/09/2023 7:31:09 PM    MRI Abdomen Pelvis W Wo Contrast  Result Date: 03/08/2023  EXAM: MRI abdomen and pelvis with and without contrast ACCESSION: [id number] UN CLINICAL INDICATION: 23 years old with restaging mesenteric desmoid  - D48.119 - [name]  COMPARISON: CT pelvis dated 11/11/2022, CT abdomen and pelvis dated 10/21/2022 TECHNIQUE: MRI of the abdomen and pelvis was obtained with and without IV contrast. Multisequence, multiplanar images were obtained.   CONTRAST: 5.41 mL of Gadopiclenol FINDINGS: LINES/DEVICES: None. LOWER CHEST: Unremarkable. ABDOMEN/PELVIS: HEPATOBILIARY: Smooth liver contour. Mild diffuse loss of signal throughout the liver on the in phase sequence. Subcentimeter right hepatic lobe cysts. The gallbladder is normal in appearance. No biliary ductal dilatation. PANCREAS: Unremarkable. SPLEEN: Unremarkable. ADRENAL GLANDS: Unremarkable. KIDNEYS/URETERS: Symmetric nephrograms. Increased size of a left interpolar parapelvic cyst measuring 1.9 cm with layering hemorrhagic debris, peripheral restricted diffusion and no central enhancement (15:21). No hydronephrosis. BLADDER: Unremarkable. BOWEL/PERITONEUM/RETROPERITONEUM: Proctocolectomy with ileoanal anastomosis. Similar prominent air-filled loops of small bowel in the lower abdomen proximal to the anastomosis. Please note, evaluation of the central mesentery is limited due to motion artifact from adjacent bowel. Overall slightly increased ill-defined T2 hypointense, enhancing soft tissue nodularity in the lower central mesentery (29:20) and extending into the pelvis/presacral region (29:29) likely corresponding to previously identified desmoid tumor. This is difficult to measure but one component measures approximately 3.3 x 3.1 cm, previously 2.9 x 2.7 cm (29:20). There is associated tethering of the adjacent small bowel. No ascites VASCULATURE: Normal caliber abdominal aorta. The portal and pelvic veins are patent. Marked narrowing of the left renal vein as it passes between the aorta and SMA, more pronounced compared to prior. Unremarkable inferior vena cava. LYMPH NODES: Multiple prominent mesenteric lymph nodes measuring up to 1.0 cm best appreciated on the diffusion-weighted sequences, previously up to 0.8 cm. REPRODUCTIVE ORGANS: Anteverted uterus. The ovaries are grossly unremarkable. BONES/SOFT TISSUES: Postsurgical changes of the right lower anterior pelvic wall. Decreased size of an avidly enhancing lesion in the right inferior rectus musculature with heterogeneous T2 signal intensity measuring 1.9 x 1.2 cm, previously 3.0 x 1.9 cm (29:47). Two additional smaller enhancing lesions in the right rectus muscle superiorly, not well seen on prior, measuring 1.3 x 0.6 cm (29:33) and 1.0 x 0.6 cm (29:7) (31:18). Numerous small dermally based cystic foci along the pelvic wall, the largest measuring up to 1.4 cm in the right posteromedial  gluteal region, previously 1.3 cm (5:41). One on the left measures 0.9 cm, previously 0.8 cm (5:39). Numerous additional small cystic foci along the left labia (for example 5:40), superior gluteal cleft (5:26) and bilateral anterior thighs (5:44, 45), some which are similar to prior. The diffusion sequences are somewhat limited by artifact but these lesions demonstrate corresponding marked DWI hyperintensity which may represent a component of T2 shine through versus restricted diffusion. No definite enhancement.     Please note, evaluation of the central mesentery is limited due to motion artifact from adjacent bowel. 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 --Sequela of proctocolectomy and ileoanal anastomosis with grossly similar dilation of bowel proximal to the anastomosis. --Three enhancing soft tissue lesions in the right rectus musculature, the largest of which is smaller in size compared to 11/11/2022. --Slightly increased size of multiple prominent central mesenteric lymph nodes measuring up to 1.0 cm which may be reactive. --Increased size of a nonenhancing 1.9 cm left interpolar parapelvic cyst with layering internal hemorrhagic debris. 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 --Findings suggestive of iron deposition within the liver. -- Numerous cystic foci along the pelvic wall without definite enhancement as described in the body of the report, correlate with physical examination. --Of note, the patient was unable to tolerate additional imaging of the chest and lower extremities at this time.    ______________________________________________________________________  Discharge Instructions:   Activity Instructions       Activity as tolerated                  Resources and Referrals    Housing Assistance Resources    1. NCHousingSearch.org  Search Tool for Affordable/low-income housing: http://www.davis-wright.info/  For help searching, please call [phone] (toll free) Monday-Friday, 9:00 a.m. - 8:00 p.m      2. HUD Affordable Housing Search  ClosetRepublicans.fi  You can use this map to find a privately owned apartment with reduced rents.  To apply: contact the apartment management office.     3. HUD Housing Counseling Service  The nationwide network of HUD participating housing counseling agencies have been helping consumers across [place] for more than 50 years by providing the answers they need to make informed housing decisions.  Use the search tool to find a local HUD participating housing counseling agency near you: https://hudgov-answers.https://www.[email or web address]/  or call toll-free [phone]      4. HUD [employer]  (Serves all of [place])  Assistance with locating affordable housing, avoiding foreclosure, help to stay in your home, etc.  http://www.lowery.com/  Contact us by Phone: To reach our office, you may call us at [phone]. You will be prompted to choose from the following selections:  Enter the extension of the person you are calling if you know their extension or press:  1 - If you know the last name of the person you are calling  3 - For Public Housing/Section 8 inquiries  4 - For our office of Copy and Development  5 - For our office of Fair Housing and Equal Opportunity  6 - For our office of Multifamily Asset Management  7 - For our office of Multifamily Production  8 - For our [employer] and Management Division ([place] Director's office)  9 -  For other options    Affordable Housing    1. [employer] and Section 8 Vouchers  [address], [place]   [phone]   Apply online:http://www.gha-[email or web address]     2. [place] [employer], Section 8 Vouchers  [address]  [place], [place] [address]  [phone]  SwimmingTub.no     3. NCHousingSearch.org  Search Tool for Affordable/low-income housing: http://www.davis-wright.info/  For help searching, please call [phone] (toll free) Monday-Friday, 9:00 a.m. - 8:00 p.m        4. HUD Affordable Housing Search  ClosetRepublicans.fi  You can use this map to find a privately owned apartment with reduced rents.  To apply: contact the apartment management office.     5. HUD Housing Counseling Service  The nationwide network of HUD participating housing counseling agencies have been helping consumers across [place] for more than 50 years by providing the answers they need to make informed housing decisions.  Use the search tool to find a local HUD participating housing counseling agency near you: https://hudgov-answers.https://www.warren.com/  or call toll-free [phone]      6. HUD [employer]  (Serves all of [place])  Assistance with locating affordable rentals, avoiding foreclosure, help to stay in your home, homeless programs, etc.  http://www.lowery.com/  Contact us by Phone: To reach our office, you may call us at [phone]. You will be prompted to choose from the following selections:  Enter the extension of the person you are calling if you know their extension or press:  1 - If you know the last name of the person you are calling  3 - For Public Housing/Section 8 inquiries  4 - For our office of Copy and Development  5 - For our office of Fair Housing and Equal Opportunity  6 - For our office of Multifamily Asset Management  7 - For our office of Multifamily Production  8 - For our [employer] and Management Division ([place] Director's office)  9 - For other options    Health visitor Assistance [facility]):    1. SW109  Call 211 to get connected to financial assistance agencies in your area or visit https://www.mckee-gibson.com/    2. [employer]   Address: [address] [place]. [place], [place] [address]   Phone: [phone]   Website: http://greensborourbanministry.org/   Service(s) Offered: Through the Emergency Assistance Program (EAP), financial assistance is provided for costs necessary to maintain housing -utility, water, rent and or mortgage payments.     3. [facility]   Address: [phone] [address] [place], [place] [address]   Phone: [phone]   Website: https://www.salvationarmycarolinas.org     4. Housing Consultants Group - [facility] [facility])   Address: [address] [place], [place] [address]   Phone: [phone]   Website: http://schultz.biz/   info@housingconsultantsgroup .org   Service(s) Offered: Home ownership and foreclosure solutions Particularly for the unemployed, underemployed, Dislocated workers, unbanked and under-banked Careers adviser the basics of handling their money and finances, including how to create Positive relationships with financial institutions     5. [facility] DSS  Emergency assistance when funding available for utilities, energy, Psychologist, educational. Medicaid. Medicaid Transportation. Child Support. Etc.  [phone]    BuilderWeekly.hu   [facility]   [address], [place], [place] [address]  [facility]  [address] [place], [place] [address]  Emergency Shelters [facility]):    1. Caring Services - Vet Safety Net  [address], [place], [place]  [address]  [phone]  Population served: Female veterans 18+ with substance abuse issues  Eligibility: By referral only    2. [facility]  [address], [place], [place] [address]  [phone]. [phone]  Population served: Adult men & women  Documents required: Valid ID & Social Security Card    3. [facility]  [address]  [place], [place]  [address]  [phone]  Population served: Families with children    4. [facility] - [employer]  [address] [place], [place]  [address]  [phone]  Population served: Single women 18+ without dependents  Documents required:  Valid ID & Social Security Card    5. [facility]  [address] [place], [place]  [address]  [phone]  Population served: Female veterans 18+ with substance abuse/mental health issues  Eligibility: By referral only    6. [facility]  [address] [place], [place] [address]  [phone]  Population served: Males 18+  Documents required: Valid ID & Social Security Card    7. [facility], [employer].  [address], [place] [address]  [phone] or [phone]  Population served: Pregnant women with or without children   Documents required: Valid ID & Social Security Card    8. [facility] [place]  [address] [place], [place] [address]  [phone]  Population served: Single adults and families with children  Documents required: Valid ID & another form of identification    9. Holiday representative of [employer]  [address] [place], [place] [address]  [phone]  Population Served: Families with children, adult women, and adult men.    10. [facility]  [address] [place], [place] [address]  [phone]  Population served: Men 18+, preference for disabled and/or veterans  Eligibility: By referral only    71. Corie Chiquito. [facility]) - Emergency Family Shelter  [address] [place], [place], [place] [address]  [phone]  Population served: Families with children.    12. ZJ673  Call 211 to get connected to emergency shelter in your area.    Homeless Services/Support [facility]):    1. Interactive Resource Center  Day resource center for people currently facing, experiencing or coming out of homelessness.  Provides fundamental services (showers, laundry, or a mailing address for example), medical and mental health care, case management and employment services.   [address], [place]  Phone: [phone]  email: info@ircgso .org  https://www.interactiveresourcecenter.org/       Eviction & Landlord Mediation [facility]):    1. [facility] Center for Housing & Community Studies  The Eviction Mediation Project provides relief to an overburdened court system. The mediation service is available to any [facility] or landlord involved in a case where a tenant is at risk of eviction or where eviction papers have been filed.   For eviction mediation assistance, please contact Ren??e Norris at [email or web address] or at [phone].    2. Legal Aid of [place]  Housing Helpline: [phone]  Assistance w/ issues w/: eviction, landlord refusing housing assistance, repairs/maintenance, public housing and section 8, and other landlord-tenant issues.  Food Assistance [place]:    1. [facility] DSS  Emergency assistance when funding available for utilities, energy, Psychologist, educational. Medicaid. Medicaid Transportation. Child Support. Etc.  [phone]    BuilderWeekly.hu   [facility]   [address] [place], [place] [address]  [facility]  [address] [place], [place] [address]      2. [place] [employer] - Armed forces logistics/support/administrative officer Pantry: The [employer] Food Pantry is a choice pantry where guests get to come in and choose the foods they would like to take home. The pantry provides emergency groceries to community members struggling to access nutritious and adequate amounts of food necessary for a healthy diet.   Hours: 9am-3pm  Residents of [facility] can come to obtain food Monday through Friday from 9:00 am until 3:00 pm. Guests can come back to the pantry once every seven days.   Address: [address], [place] [address]  Phone: [phone]     3. Blessed Table Food Pantry  http://www.theblessedtable.org/   Address: [address] [place], [place] [address]  Phone: [phone]   Tuesday- Friday: 10AM - 1PM     4. [employer] - Food Pantry  Address: [address], [place], [place], [address]  Phone: [phone]  Hours: 1st Saturday of each month 9am - 11am    5. [facility] - Food Pantry  Address: [address] [place], [place], [address]  Phone: [phone]  Hours: 2nd and 4th Tuesday of each month, from 5pm - 7pm     6. [facility] - We Care Pantry  Address: [address] [place], [place], [address]  Phone: [phone]  Hours: Wednesdays 6pm - 7pm    7. [facility] - Food Pantry  Address: [address] [place], [place], [address]  Phone: [phone]  Hours: 2nd & 4th Thursday of each month: 1pm - 3pm    8. [employer]   CarDumps.nl   Has a search function to search for food banks in your area: UpholsteryDesigners.gl   For a complete list of Food Banks/Pantries by [place], visit: https://foodbankos.https://weiss.org/       Mental Health Services:    National Suicide and Crisis Hotline: Call or text 988 to speak to a trained mental health professional. 24/7.    [facility]  Provides access to mental health services for children, adolescents, and adults presenting in a mental health crisis. The program is designed for those who need urgent behavioral health or substance use treatment and are not experiencing a medical crisis that would typically require an emergency room visit.  Phone: [phone]  Address: [address] [place], [place] [address]  Hours: Open 24/7, No appointment required.    Mobile Crisis - Therapeutic Alternatives  24-hour Crisis Response Team - call [phone]     [facility] RESOURCES  Counties Served: [place], [place], [place], [place], [place], [place], [place], [place], [place], [place], [place], Engineer, maintenance (IT), [place], [place], [place], [place], [place], [place], [place], [place], [place], [place], [place], [place], [place], [place], [place], [place], [place], [place], [place], [place], [place], [place], [place], [place], [place], [place], [place], [place], [place], [place], [place], [place], [place] Quitter   Crisis Line: [phone]  Website: www.trilliumhealthresources.org    24/7/365 Mobile Crisis    Behavioral Health Crisis Line: [phone] [place], [place], [place], [place], [place], [place], [place], [place], [place], [place], [place], [place], [place], [place], [place], [place], [place], [place], [place], [place], [place],  Lawerance Cruel, Estrellita Ludwig, [place], [place], [place], [place], [place], [place], [place], [place], Siena [employer], [place], [place], [place], [place], [place], [place], [place], [place], [place], [place], [place], [place])    [employer] Crisis: [phone] [place], [place], [place], [place], [place], [place], [place], [place], [place], [place], [address], [place], [place], W[place], [place], [place], [place], [place], [place], [place], [place], [place], [place], [place], [place], [place], Cr[place], [place], [place], [place], [place], [place], [place], [place], [place], [place], [place], [place], Pal[place], [place], [place], [place], [place], [place], [place], [place])      [place]  [address] [place], [place]   Phone: [phone]  Hours: M-F 8am-3pm  Counseling, mental health, substance use treatment    [facility] the [place]  [address], [place]  Phone: [phone]  Hours: M-F 8am to 3pm    [facility]  [address] [place], [place]  Phone: [phone]  Hours: M-F 8am to 3pm  Mental health, substance use treatment    Medication/Medical Care Assistance:     HIV Medication Assistance:  HIV Medication Assistance Program (HMAP)  Medication assistance program for low-income patients diagnosed w/ HIV to afford medications.  HMAP Client Hotline: 925-222-2489   To enroll, complete a Juanell Fairly Program Application:  Form 1: Financial Eligibility and Authorization   Form 2: Eligibility Documentation   Form 3: Income Dentist   Form 4: Verification of No or Low Income  Form 5:  Declaration of Residence  Form 6:  of Self Employment Income (complete if self-employed)  Mail completed application to: [place] Department of Health and Health and safety inspector, Division of Manufacturing engineer of Medical Care Services [address] [place], [place] [address]     HIV/AIDS Hotline: In [place]: 8705353466  Call for questions and connections to resources/treatment  [facility] Financial Assistance  Assistance w/ medical bills at [facility] facilities for low-income individuals.  English Application  Spanish Application    Contact:  Speak with a Financial Navigator Monday through Thursday, 8:30 a.m. - 4:30 p.m. and Friday, 8:30 a.m. - 12:30 p.m. For your privacy and security, you will need to provide your account number and other information before we can discuss your account.  [phone] (toll-free)  [phone]     [facility] Pharmacy Assistance  Assistance w/ medications prescribed at [facility] facilities for low-income individuals w/out insurance.  English Application  Spanish Application    Counselors are available Monday - Friday, from 8 am - 4:30 pm, to discuss the PAP program and answer your questions.  They???re located in the [employer], on the ground floor of the [facility], and also at the [facility]. You can also contact a Pharmacy Assistance Counselor by phone at [phone].     NeedyMeds.org  Website that includes resources and information on locating affordable medications / medication assistance:  NeedyMeds   HELPLINE (800) [phone]              Follow Up instructions and Outpatient Referrals     Ambulatory Referral to Occupational Therapy      Suggest Treatment: Evaluation with suggestions for treatment    Reason for referral: medically necessary    Ambulatory Referral to Physical Therapy      Suggest Treatment: Evaluation with suggestions for treatment    Reason for referral: medically necessary    Call MD for:  difficulty breathing, headache or visual disturbances      Call MD for:  persistent nausea or vomiting      Call MD for:  severe uncontrolled pain      Call MD for:  temperature >38.5 Celsius      Discharge instructions          Appointments which have been scheduled for you      Mar 23, 2023 1:30 PM  (Arrive by 1:00 PM)  RETURN VIDEO MYCHART with Ward Givens, PMHNP  [facility] CCSP 2ND [facility] CANCER HOSP [facility] REGION) [address]  [place] [address]  [phone]   Please sign into My [place] Chart at least 15 minutes before your appointment to complete the eCheck-In process. You must complete eCheck-In before you can start your video visit. We also recommend testing your audio and video connection to troubleshoot any issues before your visit begins. Click ???Join Video Visit??? to complete these checks. Once you have completed eCheck-In and tested your audio and video, click ???Join Call??? to connect to your visit.     For your video visit, you will need a computer with a working camera, speaker and microphone, a smartphone, or a tablet with internet access.    My [place] Chart enables you to manage your health, send non-urgent messages to your provider, view your test results, schedule and manage appointments, and request prescription refills securely and conveniently from your computer or mobile device.    You can go to [email or web address] to sign in to your My [place] Chart account with your username and password. If you have forgotten your username or password, please choose the ???Forgot Username???? and/or ???Forgot Password???? links to gain access. You also can access your My [place] Chart account with the free MyChart mobile app for Android or iPhone.    If you need assistance accessing your My [place] Chart account or for assistance in reaching your provider's office to reschedule or cancel your appointment, please call [facility] [phone].         Mar 24, 2023 12:30 PM  (Arrive by 12:00 PM)  LAB ONLY [place] with ADULT ONC LAB  [facility] ADULT ONCOLOGY LAB DRAW STATION [place] REGION) [address]  [place] [address]  [phone]        Mar 24, 2023 1:30 PM  (Arrive by 1:00 PM)  RETURN SARCOMA ACTIVE [place] with Sheral Apley, MD  [facility] ONCOLOGY MULTIDISCIPLINARY 2ND FLR CANCER HOSP [facility] REGION) [address]  [place] [address]  [phone]        Mar 25, 2023 12:00 PM  (Arrive by 11:30 AM)  RETURN VIDEO MYCHART with Nat Christen., MD  [facility] PAIN MANAGEMENT CENTER [address] [facility] REGION) [address]  [place] [address]  [phone]   Please sign into My  Chart at least 15 minutes before your appointment to complete the eCheck-In process. You must complete eCheck-In before you can start your video visit. We also recommend testing your audio and video connection to troubleshoot any issues before your visit begins. Click ???Join Video Visit??? to complete these checks. Once you have completed eCheck-In and tested your audio and video, click ???Join Call??? to connect to your visit.     For your video visit, you will need a computer with a working camera, speaker and microphone, a smartphone, or a tablet with internet access.    My  Chart enables you to manage your health, send non-urgent messages to your provider, view your test  results, schedule and manage appointments, and request prescription refills securely and conveniently from your computer or mobile device.    You can go to [email or web address] to sign in to your My [place] Chart account with your username and password. If you have forgotten your username or password, please choose the ???Forgot Username???? and/or ???Forgot Password???? links to gain access. You also can access your My [place] Chart account with the free MyChart mobile app for Android or iPhone.    If you need assistance accessing your My [place] Chart account or for assistance in reaching your provider's office to reschedule or cancel your appointment, please call [facility] [phone].         Mar 31, 2023 1:45 PM  (Arrive by 1:30 PM)  RETURN VIDEO DIRECT LINK with Helen Hashimoto, RD/LDN  [facility] NUTRITION SERVICES GI MEDICINE [place])  Arrive at: This is a Video Visit [address]  [facility] 1 through 4  [place] [id number]  [phone]   A direct link will be sent to you by your provider at the time of your video appointment. Please do NOT go to the clinic.     For your video visit, you will need a computer with a working camera, speaker and microphone, a smartphone, or a tablet with internet access.         Apr 01, 2023 2:00 PM  (Arrive by 1:45 PM)  RETURN VIDEO DIRECT LINK with Modena Nunnery, MD  [facility] GI MEDICINE [place])  Arrive at: This is a Video Visit [address]  [facility] 1 through 4  [place] [address]  [phone]   A direct link will be sent to you by your provider at the time of your video appointment. Please do NOT go to the clinic.     For your video visit, you will need a computer with a working camera, speaker and microphone, a smartphone, or a tablet with internet access.         Apr 29, 2023 9:30 AM  (Arrive by 9:00 AM)  NEW VIDEO [place] with Amy Elizebeth Brooking, PhD  [facility] PAIN MANAGEMENT CENTER [address] [facility] REGION) [address]  [address]  [place] [address]  [phone]   Please sign into My [place] Chart at least 15 minutes before your appointment to complete the eCheck-In process. You must complete eCheck-In before you can start your video visit. We also recommend testing your audio and video connection to troubleshoot any issues before your visit begins. Click ???Join Video Visit??? to complete these checks. Once you have completed eCheck-In and tested your audio and video, click ???Join Call??? to connect to your visit.     For your video visit, you will need a computer with a working camera, speaker and microphone, a smartphone, or a tablet with internet access.    My [place] Chart enables you to manage your health, send non-urgent messages to your provider, view your test results, schedule and manage appointments, and request prescription refills securely and conveniently from your computer or mobile device.    You can go to [email or web address] to sign in to your My [place] Chart account with your username and password. If you have forgotten your username or password, please choose the ???Forgot Username???? and/or ???Forgot Password???? links to gain access. You also can access your My [place] Chart account with the free  MyChart mobile app for Android or iPhone.    If you need assistance accessing your My [place] Chart account or for assistance in reaching your provider's office to reschedule or cancel your appointment, please call [facility] [phone].         Aug 11, 2023 10:00 AM  (Arrive by 9:45 AM)  RETURN HEM BENIGN with Archie Endo, MD  [facility]IGN HEMATOLOGY CLINIC [place] [facility] REGION) [address]  [facility] 1 through 4  [place] [address]  [phone]             ______________________________________________________________________  Discharge Day Services:  BP 108/51  - Pulse 64  - Temp 36.6 ??C (97.9 ??F) (Oral)  - Resp 16  - Ht 165.1 cm (5' 5)  - Wt 53.5 kg (118 lb)  - LMP 12/09/2022 (Approximate)  - SpO2 99%  - BMI 19.64 kg/m??       Pt seen on the day of discharge and determined appropriate for discharge.    Condition at Discharge: good    Length of Discharge: I spent greater than 30 mins in the discharge of this patient.

## 2023-03-20 NOTE — Unmapped (Signed)
[facility])   Consultation - Liaison (CL) Psychiatry  CL Psychology Follow-Up Note      Service Date:  03/20/23  Admit Date:  03/08/23  Clinician:  Arlana Pouch, PsyD  Intervention: 30 min Individual Psychotherapy  Face-to-Face Clinical Contact with Patient: 30 min  Method of Interaction: In-Person    BACKGROUND INFORMATION AND REASON FOR REFERRAL:  Please see H&P and other recent records for full details. 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 The patient was admitted to?[place]'s Oncology/Hematology service for management?of the concerns above. CL Psychology services were requested due to concern for anxiety.      Upon initial evaluation, the patient endorsed heightened anxiety in the setting of numerous triggers related to past traumatic admission that has resulted in increased stress response. Namely, Pt identified changes in care plan without timely communication as primary trigger for stress response. Evaluation limited as encounter was interrupted by Pain Management. Pt has established outpatient mental healthcare and her needs appear to be adequately addressed with current clinicians. Will follow-up with Pt to address anxiety related to this admission as time allows.     Interval history:   -Pt to discharge on 1/10    The patient was seen today for follow-up psychotherapy session to address anxiety.    ASSESSMENT  IMPRESSIONS/SUMMARY    At follow-up, Pt mood appeared improved. She voiced some anticipatory anxiety about transition home. Reviewed strategies for coping with anticipatory anxiety. Pt actively engaged and receptive to therapeutic feedback.    DIAGNOSTIC IMPRESSIONS    Trauma/stressor-disorder, unspecified  Major depressive disorder, recurrent episode, mild (by history)    Risk Assessment:  ASQ screening result: low risk    -A full risk assessment was previously performed on 03/18/23.  Risk assessment remains essentially unchanged.    Current suicide risk: low risk  Current homicide risk: low risk          PLAN  RECOMMENDATIONS    ## Safety and Observation Level:   -This patient is not currently under IVC. If safety concerns arise, please page psychiatry for an evaluation. Recommend routine level of observation per primary team.    ## Follow-up:  - The patient desires ongoing follow-up from CL Psychology while medically inpatient.  - The patient is being followed by Psychiatry.    ## Disposition:  -There are no psychological contraindications to discharging this patient when medically appropriate.   - When this patient is discharged, please ensure that their AVS includes information about the 30 Suicide & Crisis Lifeline.  -Pt to follow-up with Maryagnes Amos, PMHNP on 03/23/23      SUBJECTIVE  SESSION CONTENT     Session began with a brief check-in. Pt shared she is looking forward to going home. She indicated overall anxiety has improved since initial evaluation. Pt endorsed some anticipatory anxiety about discharge in the setting of past experiences with transitions. Pt's experiences were validated and normalized. Reviewed strategies to cope with anticipatory anxiety (e.g., adaptive coping statements, listing the facts). Pt informed she can request Psychology services should she be admitted to [facility] in the future.     OBJECTIVE   MENTAL STATUS     Orientation and consciousness: AOx4   Appearance: Appears stated age  Behavior: Calm, Cooperative, Direct eye contact, and  Polite  Speech: Within normal limits  Language: Intact  Mood: Anxious  Affect: Full and appropriate to discussion content  Perceptual disturbance (hallucinations, illusions): None  Thought process and association: Linear and coherent  Thought content (delusions, obsessions etc.): None  Insight: Intact  Judgement: Intact  Memory: WNL, although not formally assessed     EDUCATION/INTERVENTIONS:    -Completed mood check-in and reviewed the patient's current stressors and strategies to manage stress.  -Cognitive behavioral therapy  -Discussed the patient's treatment goals, revisited discussion of treatment options, and  explored goals of care.

## 2023-03-22 NOTE — Unmapped (Signed)
=  Department of Anesthesiology  Pain Medicine Division    Chronic Pain inpatient Consult follow up Note      Requesting Attending Physician:  No att. providers found  Service Requesting Consult:  Oncology/Hematology (MDE)    Assessment/Recommendations:  [name] [name] is a 23 year old female with a complex medical history, including DVT (RUE), [name] syndrome (post-colectomy), desmoid tumors, anemia, and severe protein-calorie malnutrition. She is followed at [facility] Management for chronic abdominal and right shoulder pain, consistent with cancer-associated pain. 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 Her pain is further complicated by narcotic bowel syndrome, visceral hyperalgesia, and a positive feedback loop where increased opioid use exacerbates the pain. [name] [name]???s most recent hospitalization was complicated by ileus, requiring NG tube, a prolonged inpatient rehab stay.     Recently, [name] [name] had been seen by [name] in December 2024, who decided to switch her from Suboxone to [facility] 20 mcg/h patch and continue oxycodone 10 mg 3 times a day with flexibility to increase to 5 times a day for acute pain flares. However, over the past couple of weeks, she has experienced worsening abdominal and low back pain, along with an increase in stool frequency (up to 15 times a day with watery stools for 4 weeks) and some hematochezia.    On 03/08/23, she presented for an outpatient MRI but experienced a flare-up of her chronic pain after completing one of the three planned studies. She was admitted to the [facility] emergency department for intractable acute on chronic pain.  Upon evaluation, [name] [name] reported uncontrolled pain, rating her pain as 10/10. She noted that oral oxycodone 10 mg was ineffective, and the night team had not administered IV hydromorphone, which has helped her in the past. She felt she had fallen behind on pain control, leading to worsening symptoms.    In previous hospitalizations, [name] [name] had received ketamine infusions (5/24, 6/6, 8/1) and a lidocaine infusion (09/28/2022). However, there was inconsistency in reporting the effectiveness of these treatments, making it difficult to assess their true benefit.     We have also advised [name] [name] to discuss with [name] to create an ED/Admission care plan to streamline her care, upon her presentation to the hospital in the future. To which [name] [name] and her mother agreeable to.     Interval: No acute overnight events. [name] [name] has tolerated her pain regimen since our plan to temporarily escalate and reattempt a slower wean was implemented earlier in the week.  I reemphasized to her again today that this admission has been much better than in times prior and shared with her how encouraging this fact was to me.  She was anxious about going home but cautiously optimistic herself.  We had a discussion about next steps including the plan to continue her oxycodone liquid 20 mg every 4 hours as needed and how to avoid taking her oxycodone near doses of Phenergan and, if needed, her Ativan.  [name] [name] has been very careful to avoid close administration of medications that can be sedating in the past and again promised to not take her oxycodone within at least 1 hour of taking any of her other sedating medications.  She also promised to store her liquid oxycodone in a safe and secure place at home.  We will plan to wean her oxycodone at her virtual visit with me next week.  Additionally, she is eager to work with our pain psychology team as an outpatient and has a first visit scheduled in February.  As an outpatient, we will work to create an admission care plan for her pain for future admissions as well.    Recommendations:  -The chronic pain service is a consult service and does not place orders, just makes recommendations (except ketamine and lidocaine infusions)  -Please evaluate all patients on opioids for appropriateness of prescribing narcan at discharge.  The chronic pain service can assist with this.  Nasal narcan covered by most insurance.  -Recommendations given apply to the current hospitalization and do not reflect long term recommendations.    -Ok for x1 dose of IV HM before discharge today as she has tolerated it in the past at discharge to help with the pain associated with travel home  -Continue Butrans patch 20 mcg/h [name] [name]  -Continue oxycodone liquid 20 mg every 4 hours as needed, plan to continue as an outpatient   - Will plan to see me in clinic virtually on 1/15, will plan to wean oxycodone at that time   -Continue Tylenol 1 g 3 times daily  - Will start seeing pain psychology after discharge, appointment scheduled for February    We will sign off at this time.  Please contact the chronic pain service if we can be of further assistance with pain control.    Medications trialed during this admission (or pertinent in past): Hydromorphone IV, oxycodone oral, Butrans patch    Analgesia Evaluation:  Pain at minimum: 7/10  Pain at maximum: 9/10    Allergies    Allergies   Allergen Reactions    Adhesive Tape-Silicones Hives and Rash     Paper tape and Tegederm ok. Biopatch causes blistering but has tolerated CHG Tegaderm    Ferrlecit [Sodium Ferric Gluconat-Sucrose] Swelling and Rash    Levofloxacin Swelling and Rash     Swelling in mouth, rash,     Methylnaltrexone      Per Patient: I lost bowel control, severe abdominal cramping, and elevated BP    Neomycin Swelling     Rxn after ear drops; ear swelling    Papaya Hives    Morphine Nausea And Vomiting    Zosyn [Piperacillin-Tazobactam] Itching and Rash     Red and itchy    Compazine [Prochlorperazine] Other (See Comments)     Extreme agitation    Iron Analogues     Reglan [Metoclopramide Hcl] Diarrhea    Iron Dextran Itching     Received iron dextran 06/08/22 over 12 hours, had itching and redness/flushing during the infusion and for a couple days after. Required IV benadryl w/flares between doses and ultimately treated w/IV methylpred for 2 days.     Latex, Natural Rubber Rash         Inpatient Medications  No current facility-administered medications for this encounter.     Current Outpatient Medications   Medication Sig Dispense Refill    naloxegol (MOVANTIK) 12.5 mg tablet Take 1 tablet (12.5 mg total) by mouth daily. 30 tablet 0    acetaminophen (TYLENOL) 500 MG tablet Take 2 tablets (1,000 mg total) by mouth every eight (8) hours.      buprenorphine (BUTRANS) 20 mcg/hour PTWK transdermal patch Place 1 patch on the skin every seven (7) days. 4 patch 1    cefdinir (OMNICEF) 300 MG capsule Take 1  capsule (300 mg total) by mouth every twelve (12) hours for 19 days, THEN 1 capsule (300 mg total) daily for 28 days. 66 capsule 0    cetirizine (ZYRTEC) 10 MG tablet Take 2 tablets (20 mg total) by mouth two (2) times a day for 7 days. 28 tablet 0    cholecalciferol, vitamin D3 25 mcg, 1,000 units,, 1,000 unit (25 mcg) tablet Take 1 tablet (25 mcg total) by mouth daily. 100 tablet 3    COURIERED MED OR SUPPLY OGSVEO 50 MG TABLET. 1 each 0    diphenhydrAMINE (BENADRYL) 25 mg tablet Take 2 tablets (50 mg total) by mouth two (2) times a day as needed for itching for up to 7 days. 100 tablet 0    escitalopram oxalate (LEXAPRO) 5 MG tablet Take 1 tablet (5 mg total) by mouth daily. 30 tablet 0    famotidine (PEPCID) 20 MG tablet Take 1 tablet (20 mg total) by mouth nightly. 30 tablet 3    fluticasone propionate (FLONASE) 50 mcg/actuation nasal spray Use 1 spray under the butrans patch once every 7 days to prevent allergic reaction to the adhesive      hydrocortisone 2.5 % cream Apply topically two (2) times a day as needed (hemorrhoids). 30 g 0    lidocaine (ASPERCREME) 4 % patch Place 1 patch on the skin daily as needed.      LORazepam (ATIVAN) 0.5 MG tablet Take 2 tablets (1 mg total) by mouth Three (3) times a day as needed for anxiety.      mirtazapine (REMERON) 7.5 MG tablet Take 1 tablet (7.5 mg total) by mouth nightly. 30 tablet 0    naloxone (NARCAN) 4 mg nasal spray One spray in either nostril once for known/suspected opioid overdose. May repeat every 2-3 minutes in alternating nostril til EMS arrives 2 each 0    nirogacestat (OGSIVEO) 50 mg tablet Take 3 tablets (150 mg total) by mouth two (2) times a day. Swallow tablets whole; do not break, crush, or chew. 180 tablet 5    ondansetron (ZOFRAN-ODT) 8 MG disintegrating tablet Dissolve 1 tablet (8 mg total) in the mouth every eight (8) hours as needed for nausea. 90 tablet 0    oxyCODONE (ROXICODONE) 5 mg/5 mL solution Take 20 mL (20 mg total) by mouth every four (4) hours as needed for up to 7 days. 840 mL 0    pantoprazole (PROTONIX) 40 MG tablet Take 1 tablet (40 mg total) by mouth daily before breakfast. 30 tablet 0    pimecrolimus (ELIDEL) 1 % cream Apply topically two (2) times a day as needed. To face as needed for rash      predniSONE (DELTASONE) 20 MG tablet Take 2 tablets (40 mg total) by mouth daily for 4 days. 8 tablet 0    promethazine (PHENERGAN) 12.5 MG tablet Take 1 tablet (12.5 mg total) by mouth every twelve (12) hours as needed for nausea. 14 tablet 0    scopolamine (TRANSDERM-SCOP) 1 mg over 3 days Place 1 patch (1 mg total) on the skin every third day. 10 patch 2    sucralfate (CARAFATE) 100 mg/mL suspension Take 10 mL (1 g total) by mouth four (4) times a day as needed.      triamcinolone (KENALOG) 0.1 % cream Apply topically two (2) times a day as needed. Apply to rash as needed      zinc oxide-cod liver oil (DESITIN 40%) 40 % Pste Apply topically daily as needed. 57 g  0     Lab Results   Component Value Date    CREATININE 0.56 03/19/2023       Lab Results   Component Value Date    ALKPHOS 66 03/17/2023    BILITOT 0.2 (L) 03/17/2023    BILIDIR <0.10 03/17/2023    PROT 5.8 03/17/2023    ALBUMIN 3.2 (L) 03/17/2023    ALT 25 03/17/2023    AST 12 03/17/2023       Encounter Date: 03/08/23   ECG 12 Lead   Result Value    EKG Systolic BP     EKG Diastolic BP     EKG Ventricular Rate 72    EKG Atrial Rate 72    EKG P-R Interval 134    EKG QRS Duration 86    EKG Q-T Interval 418    EKG QTC Calculation 457    EKG Calculated P Axis 49    EKG Calculated R Axis 16    EKG Calculated T Axis 35    QTC Fredericia 444    Narrative    NORMAL SINUS RHYTHM  NORMAL ECG  WHEN COMPARED WITH ECG OF 08-Mar-2023 23:46,  NO SIGNIFICANT CHANGE WAS FOUND  Confirmed by Schuyler Amor ([id number]) on 03/15/2023 2:59:17 PM       No results found for requested labs within last 30 days.       Objective:     Vital Signs       Physical Exam    GENERAL:  Well developed, well-nourished female and is in no apparent distress. Resting comfortably at the side of the bed with her belongings ready to go for discharge  HEAD/NECK:    Reveals normocephalic/atraumatic.   CARDIOVASCULAR:   Regular rate  LUNGS:   Normal work of breathing, no supplemental 02  MUSCULOSKELETAL:    Moving all 4 extremities.    SKIN:  No obvious rashes lesions or erythema  PSY:  Appropriate affect and mood.    Problem List    Principal Problem:    Chronic pain due to neoplasm  Active Problems:    [name]    Intractable abdominal pain    Generalized anxiety disorder with panic attacks    MDD (major depressive disorder)    Hypersensitivity reaction    Iron deficiency    Dizziness    Fatigue    Pouchitis (CMS-HCC)     Nat Christen., MD  Assistant Professor of Anesthesiology  Department of Anesthesiology   Divisions of General Anesthesiology and Pain Medicine    Medical Decision Making    Problems Addressed:  1 or more chronic illnesses with severe exacerbation, progression, or side effects of treatment (high)    Amount/Complexity:  Reviewed external records: Primary team progress note, GI progress note  Reviewed results: 1/9 CBC, 1/9 BMP  Ordered tests: none  Assessment requiring independent historian: none  Discussion of management/test results with medical professional: Primary team, outpatient oncologist  Independent interpretation of test: none    Risk  High: Drug therapy requiring intensive monitoring for toxicity, Decision regarding hospitalization, Elective Major Surgery w/ risk factors, Emergency Major Surgery, Decision to DNR or de-escalate care because of poor prognosis, parental controlled substances (IV opioids)

## 2023-03-23 ENCOUNTER — Telehealth: Payer: Self-pay | Admitting: *Deleted

## 2023-03-23 ENCOUNTER — Encounter: Admit: 2023-03-23 | Discharge: 2023-03-24 | Payer: BLUE CROSS/BLUE SHIELD

## 2023-03-23 DIAGNOSIS — G4700 Insomnia, unspecified: Principal | ICD-10-CM

## 2023-03-23 DIAGNOSIS — F331 Major depressive disorder, recurrent, moderate: Principal | ICD-10-CM

## 2023-03-23 DIAGNOSIS — F411 Generalized anxiety disorder: Principal | ICD-10-CM

## 2023-03-23 DIAGNOSIS — F410 Panic disorder [episodic paroxysmal anxiety] without agoraphobia: Principal | ICD-10-CM

## 2023-03-23 MED ORDER — LORAZEPAM 1 MG TABLET
ORAL_TABLET | Freq: Two times a day (BID) | ORAL | 0 refills | 30 days | Status: CP | PRN
Start: 2023-03-23 — End: ?

## 2023-03-23 NOTE — Unmapped (Signed)
[facility]  Psychiatry--Comprehensive Cancer Support Program   Established Patient E&M Service - Outpatient       Assessment:    [name] [name] presents for follow-up evaluation. She states that she started taking her escitalopram 5 mg while in the hospital and that she felt like that was a good time to start the medication in case she had difficulty tolerating it. No issues tolerating the escitalopram 5 mg. No significant improvement in mood or anxiety, but she has only been taking this medication for a few weeks. She continues to use her lorazepam 0.5 mg sparingly and finds it not always helpful, so I'm increasing her lorazepam dose to 1 mg BID PRN. Ongoing struggles with sleep, which she attributes to worsening anxiety at night. I'm hopeful that she can recognize benefit in the escitalopram in the next few weeks. Will plan to reassess on 04/08/23.    Identifying Information:  [name] [name] is a 23 y.o. 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 major depressive disorder and generalized anxiety disorder.     Risk Assessment:  An assessment of suicide and violence risk factors was performed as part of this evaluation and is not significantly increased from the last visit.   While future psychiatric events cannot be accurately predicted, the patient does not currently require acute inpatient psychiatric care and does not currently meet [facility] involuntary commitment criteria.      Plan:    Problem 1: Major Depressive Disorder  Status of problem: chronic with mild to moderate exacerbation  Interventions:   Patient stopped her mirtazapine while in the hospital   Continue escitalopram 5 mg daily. She started this medication while she was in the hospital  Supportive psychotherapy.   Provided patient with the therapist information that [name] had recommended at [facility] or one of his associates).   Provided patient with information re: [facility]    Problem 2: Generalized Anxiety Disorder  Status of problem: chronic with moderate to severe exacerbation  Interventions:   See MDD plan above  Increased lorazepam dose from 0.5 mg to 1 mg PRN. Patient using this medication sparingly and finds the 0.5 mg dose not always helpful.     Problem 3: Insomnia  Status of problem:  some improvement  Interventions:   Will plan to provide sleep hygiene education  Recommended that she take a lorazepam at bedtime when her anxiety is elevated and she is having a hard time falling asleep.       Patient has been given this writer's contact information as well as the [facility] Psychiatry urgent line number. The patient has been instructed to call 911 for emergencies.      Subjective:    Chief complaint:  Follow-up psychiatric evaluation for depression and anxiety    Interval History:   States that she did not start the escitalopram until she was in the hospital. She stopped the mirtazapine while in the hospital due to feeling overly sedated on the mirtazapine and not wanting to feel too sedated with the pain medications. States that the mirtazapine was not helping with sleep and now that she is not taking it, her sleep has improved some. Sleep remains an issue and she finds that her anxiety  is worse at night. Tolerating the escitalopram without any adverse effects. States that the escitalopram is not helping much with mood yet, but it has only been a few weeks.     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 Started school today.    States that she has not been feeling great since she was discharged from the hospital. Explained that her pain is improving, but she has been having GI bleeding in the past few days and states that there are times she feels like she is going to pass out.     She continues to endorse panic attacks. Finds lorazepam helpful when experiencing panic. Also endorsing a new checking behavior where she checks social media apps to make sure she didn't post anything when she knows that she didn't post anything.     Prior Psychiatric Medication Trials: amitriptyline (could not tolerate), diazepam, duloxetine (could not tolerate), hydroxyzine (urinary retention), olanzapine (urinary retention)     Psychiatric Review of Systems  Depressive Symptoms: anhedonia, depressed mood, difficulty concentrating, fatigue, feelings of worthlessness/guilt, hopelessness, insomnia, and decreased motivation  Manic Symptoms: N/A  Psychosis Symptoms: N/A  Anxiety Symptoms: excessive anxiety and worry, difficulty controlling worry, easily fatigued, difficulty concentrating, muscle tension, difficulty falling asleep, difficulty staying asleep   Obsession/Compulsions: checks outlets at times and double checks the number of pills in her bottles after taking the medications  Panic Symptoms: recurrent, unexpected panic attacks, accelerated heart rate, numbness/tingling, I can't think rationally  Trauma Symptoms: upsetting thoughts/images, feeling emotional when reminded of the trauma, avoidance, feeling distant or cut off from people around her, insomnia, difficulty concentrating, feeling as if her future hopes or plans will not come true  Sleep disturbance: frequent night time awakening and difficulty falling asleep       Objective:      Mental Status Exam:  Appearance:    Appears stated age, Well nourished, and Clean/Neat   Motor:   No abnormal movements   Speech/Language:    Normal rate, volume, tone, fluency   Mood:    OK   Affect:   Calm, Cooperative, and Euthymic   Thought process and Associations:   Logical, linear, clear, coherent, goal directed   Abnormal/psychotic thought content:     Denies SI, HI, self harm, delusions, obsessions, paranoid ideation, or ideas of reference   Perceptual disturbances:     Denies auditory and visual hallucinations, behavior not concerning for response to internal stimuli     Other:   Insight and judgment intact     I personally spent 74 minutes face-to-face and non-face-to-face in the care of this patient, which includes all pre, intra, and post visit time on the date of service.    Visit was completed by video (or phone) and the appropriate disclaimer has been included below.    The patient reports they are physically located in [place] and is currently: at home. I conducted a audio/video visit. I spent  30m 04s on the video call with the patient. I spent an additional 15 minutes on pre- and post-visit activities on the date of service .       Sheran Lawless, PMHNP  03/23/2023

## 2023-03-23 NOTE — Unmapped (Signed)
[facility] BONE AND SOFT TISSUE ONCOLOGY RETURN VISIT    Encounter Date: 03/24/2023  Patient Name: [name] [name]  Medical Record Number: [id number]    Referring Physician: Sheral Apley, MD  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    Desmoid fibromatosis, history of [name]     She has had numerous sites of disease, including paraspinal, chest wall, right arm/wrist. She has had several cryoablations with Dr. Cathie Hoops which have seemed to offer clinical benefit. She trialed sorafenib which seemed to lead to shrinkage of lesions, but she had significant intolerance with dizziness, presyncope, fatigue, hair loss, rash, GI side effects.      We have discussed at length the nature of desmoid fibromatosis tumors, the fact they can wax and wane independent of therapy, are not cancers in a metastatic potential, but can be locally infiltrative and cause substantial morbidity to the site of disease and occasionally mortality. I reviewed that the emerging standard of care is observation based on the fact that 20-30% will experience spontaneous regression in the year following diagnosis. Given her long experience with desmoids, complete regression may be less likely, but we will still approach this cautiously with parallel goals of avoidance of overtreatment (particularly surgical) and palliation of symptoms / preservation of function.     At first visit in 2022, her lesions are in the shoulder, right forearm, chest area, with a possible desmoid in her left thigh. Since then, she has had waxing waning course with significant pain issues, malnutrition requiring intermittent TPN and tube feeds, multiple GI bleeds, significant anxiety, depression, multiple hospitalizations. Had tumor response to sorafenib but developed GI bleeding and had to stop. IV iron infusion led to severe anaphylaxis.    09/16/22: Start nirogacestat.     09/25/22: Admitted with uncontrolled pain and muscle spasms after IR guided procedure. Prolonged hospital stay, neurologic evaluation, managed with muscle relaxers (baclofen, valium). Discharge 9/11 to rehab. Ramped up on nirogacestat with some side effects, discharged from rehab 10/2.    12/16/22: Tolerating nirogacestat reasonably well. Dealing with hot flashes, edema, nausea, muscle spasticity, anxiety, pain. Resumed mirtazepine and movantik for nausea, appetite, constipation. Resumed lexapro 5mg  for hot flashes and mood symptoms.    01/23/23: Ordered MRI. Ziopatch for palpitations. Reconnect with mental health.    02/24/23: Continue nirogacestat. Upcoming MRI. Recent GI bleeding. Ongoing severe pain, anxiety.    12/29-1/10: Admission for severe pain, increase in BMs, hematochezia. Found to have pouchitis, treated with cefdinir. Frustrations with inpatient pain control. Received IV iron for iron deficiency.    03/24/23:   Assessment & Plan  Desmoid Fibromatosis  Desmoid fibromatosis in the setting of familial adenomatous polyposis ([name]). Multiple desmoid tumors, including a larger mesenteric desmoid causing chronic pain, gut motility issues, and malnutrition. Currently on nirogacestat with stable disease. Discussed risks and benefits of nirogacestat, including its role in preventing tumor growth and potential side effects. Considered alternative treatments such as sorafenib and direct tumor chemotherapy, but these were deemed less suitable due to multiple tumor sites and potential complications.  - Continue nirogacestat  - Monitor nutritional status  - Consider TPN if nutritional status does not improve  - Discuss other possible nutritional strategies with GI team  - Evaluate for potential antibiotic cycling with  GI team    Pouchitis  Recent hospitalization for pouchitis and severe pain flare. Symptoms include GI bleeding, frequent diarrhea (up to 10 times a day), and severe nausea. Current antibiotic regimen may not be effective. Discussed potential need for antibiotic cycling or change, including ciprofloxacin.  - Consult GI for potential antibiotic cycling or change  - Consider ciprofloxacin if current antibiotics are ineffective  - Monitor for signs of dehydration and malnutrition  - Administer IV fluids for hydration    Chronic Pain  Chronic pain associated with desmoid tumors and pouchitis. Pain management has been challenging, with recent hospital stay for pain control. Current plan includes regular check-ins with pain management team. Discussed risks of high-dose opioids, including bowel complications, and need for a balanced approach.  - Continue current pain management plan with [name]  - Monitor pain levels and adjust medications as needed  - Consider non-opioid pain management strategies to avoid bowel complications    Nutritional Deficiency  Significant weight loss (down to 114 lbs from 119 lbs post-hospitalization). Difficulty eating due to nausea and fear of vomiting. Discussed potential benefits and risks of TPN, including need for a port and risk of infection.  - Administer IV fluids for hydration  - Defer TPN decision making to GI team  - Monitor weight and nutritional intake closely  - Consult with nutritionist for dietary support    Anemia  Anemia with recent hemoglobin levels stable at 13.9, likely secondary to chronic GI bleeding and malnutrition. Discussed importance of monitoring hemoglobin levels and ensuring adequate nutritional intake to support red blood cell production.  - Monitor hemoglobin levels regularly  - Administer iron supplements as needed  - Ensure adequate nutritional intake to support red blood cell production    Hypertension  Elevated blood pressure readings (150s/90s), likely secondary to pain, stress, and dehydration. Discussed importance of addressing underlying causes such as pain and dehydration to manage blood pressure.  - Monitor blood pressure regularly  - Address underlying causes such as pain and dehydration  - Consider antihypertensive medication if blood pressure remains elevated    General Health Maintenance  Ongoing need for comprehensive care including mental health support and multidisciplinary team involvement. Discussed importance of regular follow-ups and maintaining communication with all healthcare providers.  - Continue regular follow-ups with multidisciplinary team  - Ensure mental health support is in place  - Maintain regular communication with all healthcare providers    Follow-up  - Follow up with GI on April 01, 2023 - recommend she reach out with her questions about nutrition and antibiotics.  - Regular check-ins with pain management team  - Monitor lab results and adjust treatment plans as needed    Plan:  -RTC 1 month for tox check    I personally reviewed the medical records, pathology and laboratory results and viewed the imaging. All questions were answered to the patient's apparent satisfaction and they voiced understanding and agreement with the plan. Pt has my card and contact information and is encouraged to reach out with any further questions.    Donzetta Sprung, MD/MPH  Assistant Professor  Bone and Soft Tissue Oncology Program  [facility]  Pager: [phone]  Nurse Navigator: Roseanne Reno RN, BSN, OCN      REASON FOR CONSULTATION:   [name] [name] is a 23 y.o. female who is seen in consultation at the request of [name] Jonita Albee, MD for evaluation of her desmoid fibromatosis    HISTORY OF PRESENT ILLNESS:  Attends school at [employer] in [place], [place]      Right arm desmoid is the most bothersome - significant arm cramping, deep aching pain, lasts for ~30 min after cramping starts. Has noticed right hand shaking frequently. 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     Left chest lesion used to be quite uncomfortable, caused swelling in her arm and discomfort. Improved after cryoablation.     Right shoulder blade hurts when she leans back against it, fairly painful. Right chest wall lesion has some associated pain, improving since cryo.     First diagnosed at 23 years old - seen in [place] at first, had numerous cysts. First major issues were two paraspinal lesions, referred to [facility] for biopsy and ultimately resection.  Soon diagnosed with [name], seen by medical genetics early on.  Guided by Dr. Debbe Mounts, referred out to proceduralists as needed.  2011 - had a growing lesion on her back as well as bilateral neck lesions, resected at [facility], found to be desmoid  02/2012 - surgery for thoracic spine desmoid. Maybe another flank lesion removed  2017 - wrist lesion treated by [facility] with bupivicaine, kenalog  08/2016 - endoscopy with adenomatous polyp of the colon, non-dysplastic  02/2017 - subcutaneous lesion resection by plastic surgery (postauricular and RUE, RLE) - path revealed epidermal inclusion cysts, osteomas  05/2018: cryotherapy of left anterior chest wall lesion  05/2018: started sulindac but struggled with adherence due to GI side effects  08/2018: cryo #2 to chest wall lesion  12/10/2018: started sorafenib due to progression in left paraspinal lesion, ?posterior chest wall lesion  02/2019: discontinued sorafenib for several reasons: fatigue, dizziness, nausea, hair loss, as well as COVID infection in late November. Derm consulted for rash / hair loss, started on antihistamines, topical steroids. Trialed lower dose but ultimately stopped again 05/2019 due to toxicity.  06/2019: follow up with surgical oncology - plans for observation rather than repeat surgery  07/2019: cryoablation of anterior chest wall desmoid  07/2019: excision of subcutaneous LE mass and L neck masses, clinically appeared consistent with cysts  08/2019: cryoablation of R posterior chest wall, left paraspinal desmoid     Interval History:  History of Present Illness  The patient is a 23 year old individual with a history of desmoid fibromatosis in the context of Familial Adenomatous Polyposis ([name]). She has multiple desmoid tumors, including a large mesenteric desmoid, which has led to chronic pain and gut motility issues, resulting in malnutrition. The patient was recently hospitalized due to an episode of pouchitis and severe pain flare. She is currently on nirogacestat for the desmoid tumors.    The patient reports a significant decline in her health since her last visit. She has been experiencing gastrointestinal bleeding, severe nausea, and dizziness, especially upon standing. She also reports a significant weight loss, with her current weight being around 116 pounds. The patient also reports high blood pressure readings, with systolic values in the 150s and diastolic values in the high 90s.    The patient's gastrointestinal symptoms have worsened, with approximately ten episodes of diarrhea per day and nocturnal incontinence. She expresses concern that her current antibiotic regimen may not be effective in managing her pouchitis. The patient also reports feeling violently ill and has difficulty standing without experiencing visual disturbances and near syncope.    The patient suggests the possibility of receiving an infusion or considering a temporary Total Parenteral Nutrition (TPN) stint to improve her nutritional status. She expresses reluctance towards having  a central line due to the impact on her quality of life and the frequent complications she has experienced with previous lines, including infiltration and hematomas.    The patient's pain levels remain high, and she reports a mixed response to her pain management regimen during her recent hospital stay. She also reports adverse reactions to iron infusions. The patient acknowledges the emotional toll of her illness, particularly in the absence of her usual support person during her recent hospital stay.    The patient is also on Movantik for bowel regulation, which she takes as needed. She reports that her bowel movements have decreased to four or less per day, leading to the discontinuation of her pain medication.    The patient also reports palpitations, which have been confirmed by a Zio patch to be due to ectopic atrial rhythm. This is believed to be a benign cause of arrhythmia, potentially due to dehydration or overall illness.    The patient has a history of multiple hospitalizations and continues to struggle with maintaining her nutrition. She expresses fear and frustration about her ongoing health issues and the impact on her quality of life. She is considering the possibility of resuming chemotherapy, despite the potential side effects and the need for a port for administration.    REVIEW OF SYSTEMS:  A comprehensive review of 12 systems was negative except for pertinent positives noted in HPI.    Past Medical History, Surgical History, Family History were reviewed personally. Any changes were updated as above.    Social History:  Attended [employer] in [place], has been interrupted by health issues  Wants to be a Engineer, civil (consulting) in pediatric hematology/oncology   Mother Natalia Leatherwood is a strong advocate, often present at visits    Pregnancy status: premenopausal, interested in future children    ALLERGIES/MEDICATIONS:  Reviewed in Epic    PHYSICAL EXAM:   BP 127/88  - Pulse 90  - Temp 36.7 ??C (98.1 ??F)  - Resp 18  - Ht 165.1 cm (5' 5)  - Wt 52.8 kg (116 lb 6.4 oz)  - LMP 12/09/2022 (Approximate)  - SpO2 98%  - BMI 19.37 kg/m??   ECOG 1  Gen - well appearing, mother accompanying her   Eyes - conjunctivae clear, PERRL  ENT -no scleral icterus, moist mucous membranes  Lymph - no cervical or supraclavicular lymphadenopathy appreciated  Resp - clear to auscultation bilaterally, no wheezes, crackles or rales  CV - normal rate, regular rhythm, no lower extremity edema noted  Skin - flushing of skin noted on chest  Neuro - AOx3, EOM intact, no facial droop, speech fluent and coherent, moves all extremities without asymmetry  Psych - affect anxious  MSK - no joint tenderness or swelling    PATHOLOGY:   Pathology was personally reviewed as described in the HPI, detailed in Epic.     LABS:  Reviewed in Epic    RADIOLOGY:  Imaging was personally viewed and interpreted as summarized in the history (see above).

## 2023-03-23 NOTE — Transitions of Care (Post Inpatient/ED Visit) (Signed)
   03/23/2023  Name: Lisa Donovan MRN: [id number] DOB: 2000/07/03  Today's TOC FU Call Status: Today's TOC FU Call Status:: Unsuccessful Call (1st Attempt) Unsuccessful Call (1st Attempt) Date: 03/23/23  Attempted to reach the patient regarding the most recent Inpatient/ED visit.  Follow Up Plan: Additional outreach attempts will be made to reach the patient to complete the Transitions of Care (Post Inpatient/ED visit) call.   Cathlean Headland BSN RN Population Health- Transition of Care Team.  [facility] [phone]

## 2023-03-24 ENCOUNTER — Telehealth: Payer: Self-pay | Admitting: *Deleted

## 2023-03-24 ENCOUNTER — Ambulatory Visit: Admit: 2023-03-24 | Discharge: 2023-03-25 | Payer: BLUE CROSS/BLUE SHIELD

## 2023-03-24 ENCOUNTER — Other Ambulatory Visit: Admit: 2023-03-24 | Discharge: 2023-03-25 | Payer: BLUE CROSS/BLUE SHIELD

## 2023-03-24 DIAGNOSIS — E611 Iron deficiency: Principal | ICD-10-CM

## 2023-03-24 DIAGNOSIS — D48119 Desmoid tumor: Principal | ICD-10-CM

## 2023-03-24 DIAGNOSIS — E860 Dehydration: Principal | ICD-10-CM

## 2023-03-24 DIAGNOSIS — D500 Iron deficiency anemia secondary to blood loss (chronic): Principal | ICD-10-CM

## 2023-03-24 DIAGNOSIS — T7840XA Allergy, unspecified, initial encounter: Principal | ICD-10-CM

## 2023-03-24 DIAGNOSIS — Z79899 Other long term (current) drug therapy: Principal | ICD-10-CM

## 2023-03-24 LAB — COMPREHENSIVE METABOLIC PANEL
ALBUMIN: 4 g/dL (ref 3.4–5.0)
ALKALINE PHOSPHATASE: 72 U/L (ref 46–116)
ALT (SGPT): 17 U/L (ref 10–49)
ANION GAP: 12 mmol/L (ref 5–14)
AST (SGOT): 18 U/L (ref <=34)
BILIRUBIN TOTAL: 0.4 mg/dL (ref 0.3–1.2)
BLOOD UREA NITROGEN: 10 mg/dL (ref 9–23)
BUN / CREAT RATIO: 18
CALCIUM: 10.2 mg/dL (ref 8.7–10.4)
CHLORIDE: 102 mmol/L (ref 98–107)
CO2: 29 mmol/L (ref 20.0–31.0)
CREATININE: 0.55 mg/dL (ref 0.55–1.02)
EGFR CKD-EPI (2021) FEMALE: >90 mL/min/{1.73_m2} (ref >=60)
GLUCOSE RANDOM: 95 mg/dL (ref 70–179)
POTASSIUM: 3.8 mmol/L (ref 3.5–5.1)
PROTEIN TOTAL: 7.2 g/dL (ref 5.7–8.2)
SODIUM: 143 mmol/L (ref 135–145)

## 2023-03-24 LAB — CBC W/ AUTO DIFF
BASOPHILS ABSOLUTE COUNT: 0 10*9/L (ref 0.0–0.1)
BASOPHILS RELATIVE PERCENT: 0.3 %
EOSINOPHILS ABSOLUTE COUNT: 0.5 10*9/L (ref 0.0–0.5)
EOSINOPHILS RELATIVE PERCENT: 4.5 %
HEMATOCRIT: 42.5 % (ref 34.0–44.0)
HEMOGLOBIN: 13.9 g/dL (ref 11.3–14.9)
LYMPHOCYTES ABSOLUTE COUNT: 2.2 10*9/L (ref 1.1–3.6)
LYMPHOCYTES RELATIVE PERCENT: 19.3 %
MEAN CORPUSCULAR HEMOGLOBIN CONC: 32.8 g/dL (ref 32.0–36.0)
MEAN CORPUSCULAR HEMOGLOBIN: 25.9 pg (ref 25.9–32.4)
MEAN CORPUSCULAR VOLUME: 79 fL (ref 77.6–95.7)
MEAN PLATELET VOLUME: 9.8 fL (ref 6.8–10.7)
MONOCYTES ABSOLUTE COUNT: 1 10*9/L — ABNORMAL HIGH (ref 0.3–0.8)
MONOCYTES RELATIVE PERCENT: 8.5 %
NEUTROPHILS ABSOLUTE COUNT: 7.7 10*9/L (ref 1.8–7.8)
NEUTROPHILS RELATIVE PERCENT: 67.4 %
PLATELET COUNT: 250 10*9/L (ref 150–450)
RED BLOOD CELL COUNT: 5.37 10*12/L — ABNORMAL HIGH (ref 3.95–5.13)
RED CELL DISTRIBUTION WIDTH: 15.8 % — ABNORMAL HIGH (ref 12.2–15.2)
WBC ADJUSTED: 11.4 10*9/L — ABNORMAL HIGH (ref 3.6–11.2)

## 2023-03-24 LAB — IRON & TIBC
IRON SATURATION: 32 % (ref 20–55)
IRON: 75 ug/dL (ref 50–170)
TOTAL IRON BINDING CAPACITY: 238 ug/dL — ABNORMAL LOW (ref 250–425)

## 2023-03-24 NOTE — Unmapped (Signed)
 Comprehensive Cancer Support Program (CCSP) - Psychiatry Outpatient Clinic   After Visit Summary    It was a pleasure to see you today in the [facility]???s Comprehensive Cancer Support Program (CCSP). The CCSP is a multidisciplinary program dedicated to helping patients, caregivers, and families with cancer treatment, recovery and survivorship.      To schedule, cancel, or change your appointment:  Please call the [facility] schedulers at [phone], Monday through Friday 8AM - 5PM.  Someone will return your call within 24 hours.      If you have a question about your medicines or you need to contact your provider:  First, try sending a My Chart message to Maryagnes Amos, PMHNP. If you are unable to do so, please call the CCSP program coordinator, [facility], at [phone].     For after hours urgent issues, you may call [phone] or call the I need to talk line at [phone]-TALK ([phone]) anytime 24/7.    CCSP Patient and Family Resource Center: [phone].    CCSP Website:  [email or web address]    For prescription refills, please allow at least 24 hours (during business hours, M-F) for providers to call in refills to your pharmacy. We are generally unable to accommodate same-day requests for refills.     If you are taking any controlled substances (such as anxiety or sleep medications), you must use them as the directions say to use them. We generally do not provide early refills over the phone without clear reason, and it would be inappropriate to obtain the medications from other doctors. We routinely use the [place] controlled substance database to monitor prescription drug use.

## 2023-03-24 NOTE — Transitions of Care (Post Inpatient/ED Visit) (Signed)
   03/24/2023  Name: Meztli E Eble MRN: [id number] DOB: [birth year]  Today's TOC FU Call Status: Today's TOC FU Call Status:: Unsuccessful Call (2nd Attempt) Unsuccessful Call (2nd Attempt) Date: 03/24/23  Attempted to reach the patient regarding the most recent Inpatient/ED visit.  Follow Up Plan: Additional outreach attempts will be made to reach the patient to complete the Transitions of Care (Post Inpatient/ED visit) call.   Mliss Creed [facility], BSN RN Care Manager/ Transition of Care [place]/ [facility] [phone]

## 2023-03-25 ENCOUNTER — Telehealth: Payer: Self-pay | Admitting: *Deleted

## 2023-03-25 ENCOUNTER — Ambulatory Visit
Admit: 2023-03-25 | Discharge: 2023-03-26 | Payer: BLUE CROSS/BLUE SHIELD | Attending: Student in an Organized Health Care Education/Training Program | Primary: Student in an Organized Health Care Education/Training Program

## 2023-03-25 ENCOUNTER — Ambulatory Visit
Admit: 2023-03-25 | Discharge: 2023-04-11 | Disposition: A | Discharge status: Rehabilitation Facility | Payer: BLUE CROSS/BLUE SHIELD

## 2023-03-25 ENCOUNTER — Ambulatory Visit: Admit: 2023-03-25 | Discharge: 2023-04-11 | Payer: BLUE CROSS/BLUE SHIELD

## 2023-03-25 ENCOUNTER — Encounter: Admit: 2023-03-25 | Payer: BLUE CROSS/BLUE SHIELD

## 2023-03-25 ENCOUNTER — Ambulatory Visit: Admit: 2023-03-25 | Payer: BLUE CROSS/BLUE SHIELD

## 2023-03-25 ENCOUNTER — Inpatient Hospital Stay
Admit: 2023-03-25 | Discharge: 2023-04-11 | Disposition: A | Discharge status: Rehabilitation Facility | Payer: BLUE CROSS/BLUE SHIELD

## 2023-03-25 ENCOUNTER — Inpatient Hospital Stay: Admit: 2023-03-25 | Discharge: 2023-03-26 | Payer: BLUE CROSS/BLUE SHIELD

## 2023-03-25 DIAGNOSIS — D500 Iron deficiency anemia secondary to blood loss (chronic): Principal | ICD-10-CM

## 2023-03-25 DIAGNOSIS — D48119 Desmoid tumor: Principal | ICD-10-CM

## 2023-03-25 DIAGNOSIS — T7840XA Allergy, unspecified, initial encounter: Principal | ICD-10-CM

## 2023-03-25 DIAGNOSIS — E860 Dehydration: Principal | ICD-10-CM

## 2023-03-25 LAB — CBC W/ AUTO DIFF
BASOPHILS ABSOLUTE COUNT: 0 10*9/L (ref 0.0–0.1)
BASOPHILS RELATIVE PERCENT: 0.5 %
EOSINOPHILS ABSOLUTE COUNT: 0.4 10*9/L (ref 0.0–0.5)
EOSINOPHILS RELATIVE PERCENT: 4.5 %
HEMATOCRIT: 37.1 % (ref 34.0–44.0)
HEMOGLOBIN: 12.8 g/dL (ref 11.3–14.9)
LYMPHOCYTES ABSOLUTE COUNT: 2.7 10*9/L (ref 1.1–3.6)
LYMPHOCYTES RELATIVE PERCENT: 31.9 %
MEAN CORPUSCULAR HEMOGLOBIN CONC: 34.4 g/dL (ref 32.0–36.0)
MEAN CORPUSCULAR HEMOGLOBIN: 27.5 pg (ref 25.9–32.4)
MEAN CORPUSCULAR VOLUME: 80 fL (ref 77.6–95.7)
MEAN PLATELET VOLUME: 10 fL (ref 6.8–10.7)
MONOCYTES ABSOLUTE COUNT: 0.7 10*9/L (ref 0.3–0.8)
MONOCYTES RELATIVE PERCENT: 8.2 %
NEUTROPHILS ABSOLUTE COUNT: 4.6 10*9/L (ref 1.8–7.8)
NEUTROPHILS RELATIVE PERCENT: 54.9 %
PLATELET COUNT: 210 10*9/L (ref 150–450)
RED BLOOD CELL COUNT: 4.64 10*12/L (ref 3.95–5.13)
RED CELL DISTRIBUTION WIDTH: 15.8 % — ABNORMAL HIGH (ref 12.2–15.2)
WBC ADJUSTED: 8.4 10*9/L (ref 3.6–11.2)

## 2023-03-25 LAB — COMPREHENSIVE METABOLIC PANEL
ALBUMIN: 3.7 g/dL (ref 3.4–5.0)
ALKALINE PHOSPHATASE: 63 U/L (ref 46–116)
ALT (SGPT): 13 U/L (ref 10–49)
ANION GAP: 4 mmol/L — ABNORMAL LOW (ref 5–14)
AST (SGOT): 17 U/L (ref <=34)
BILIRUBIN TOTAL: 0.3 mg/dL (ref 0.3–1.2)
BLOOD UREA NITROGEN: 7 mg/dL — ABNORMAL LOW (ref 9–23)
BUN / CREAT RATIO: 13
CALCIUM: 9.4 mg/dL (ref 8.7–10.4)
CHLORIDE: 108 mmol/L — ABNORMAL HIGH (ref 98–107)
CO2: 28 mmol/L (ref 20.0–31.0)
CREATININE: 0.56 mg/dL (ref 0.55–1.02)
EGFR CKD-EPI (2021) FEMALE: >90 mL/min/{1.73_m2} (ref >=60)
GLUCOSE RANDOM: 90 mg/dL (ref 70–179)
POTASSIUM: 3.4 mmol/L (ref 3.4–4.8)
PROTEIN TOTAL: 6.9 g/dL (ref 5.7–8.2)
SODIUM: 140 mmol/L (ref 135–145)

## 2023-03-25 LAB — BLOOD GAS CRITICAL CARE PANEL, VENOUS
BASE EXCESS VENOUS: 2.5 — ABNORMAL HIGH (ref -2.0–2.0)
CALCIUM IONIZED VENOUS (MG/DL): 4.93 mg/dL (ref 4.40–5.40)
GLUCOSE WHOLE BLOOD: 87 mg/dL (ref 70–179)
HCO3 VENOUS: 29 mmol/L — ABNORMAL HIGH (ref 22–27)
HEMOGLOBIN BLOOD GAS: 13.2 g/dL (ref 12.00–16.00)
LACTATE BLOOD VENOUS: 0.7 mmol/L (ref 0.5–1.8)
O2 SATURATION VENOUS: 63.5 % (ref 40.0–85.0)
PCO2 VENOUS: 51 mmHg (ref 40–60)
PH VENOUS: 7.36 (ref 7.32–7.43)
PO2 VENOUS: 35 mmHg (ref 30–55)
POTASSIUM WHOLE BLOOD: 3.4 mmol/L (ref 3.4–4.6)
SODIUM WHOLE BLOOD: 142 mmol/L (ref 135–145)

## 2023-03-25 LAB — PHOSPHORUS: PHOSPHORUS: 3.5 mg/dL (ref 2.4–5.1)

## 2023-03-25 LAB — APTT
APTT: 33.7 s (ref 24.8–38.4)
HEPARIN CORRELATION: 0.2

## 2023-03-25 LAB — MAGNESIUM: MAGNESIUM: 2.2 mg/dL (ref 1.6–2.6)

## 2023-03-25 LAB — PROTIME-INR
INR: 1.11
PROTIME: 12.7 s — ABNORMAL HIGH (ref 9.9–12.6)

## 2023-03-25 MED ADMIN — oxyCODONE (ROXICODONE) immediate release tablet 20 mg: 20 mg | ORAL | @ 20:00:00 | Stop: 2023-03-25

## 2023-03-25 MED ADMIN — sodium chloride 0.9% (NS) bolus 1,000 mL: 1000 mL | INTRAVENOUS | @ 15:00:00 | Stop: 2023-03-25

## 2023-03-25 MED ADMIN — LORazepam (ATIVAN) tablet 1 mg: 1 mg | ORAL | @ 19:00:00 | Stop: 2023-03-25

## 2023-03-25 MED ADMIN — HYDROmorphone (PF) (DILAUDID) injection 0.5 mg: 0.5 mg | INTRAVENOUS | @ 16:00:00 | Stop: 2023-03-25

## 2023-03-25 MED ADMIN — promethazine (PHENERGAN) injection 12.5 mg: 12.5 mg | INTRAVENOUS | @ 15:00:00 | Stop: 2023-03-25

## 2023-03-25 NOTE — Unmapped (Signed)
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 Patient presents to infusion area for IV hydration and anti-emetics.     Paged regarding: Informed by primary RN, Kara Mead, of patient's complaint of poor oral intake, worsening abdominal pain, uncontrolled nausea, ongoing blood in stool, and overall failure to thrive.       HPI: Patient seen by primary oncologist, Dr. [name], yesterday for hospital follow-up after being discharged from hospital on 03/20/23 for pouchitis. She is here today for IV hydration and nausea medication.     Today, patient explains she feels she needs to be admitted to the hospital for consideration of TPN, due to her inability to tolerate oral intake and progressive weight loss of five pounds since discharge. Patient explains she is scared to eat due to fear of vomiting. She is using Zofran prn at home with mild relief. She also explains she is continuing to have bright red blood with bowel movements since Saturday. She does admit her frequency of stools has improved, but feels this is because she is not eating. Last dose of Cefdinir was yesterday.     Discussed above complaints with Dr. [name] and he explained her desmoids are stable and her current issues are chronic and not acutely oncological. He spoke with GI attending, Dr. [name], to determine if there was a role for short term parenteral nutrition given her weight loss and FTT. Per discussion with Dr. [name] and Dr. Barton Fanny, given normal albumin there does not seem to be role for TPN at this juncture. Therefore, there is no current medical indication for hospital admission from medical oncology and GI standpoint. I shared this recommendation with patient and mother at bedside. Patient became very tearful, increasingly anxious and frustrated. Patient verbalized, I do not feel safe going home I feel like something is going to happen to me. I just don't understand this. I again explained the rationale in great detail to patient and mother. I advocated for patient to be seen in the ER for further evaluation if she does not feel her symptoms can be appropriately managed at home as we are unable to accommodate her in the infusion area later this week due to scheduling constraints.  Patient also spoke with her oncology nurse navigator, Roseanne Reno, to inform her of plan of care.       Assessment/Plan    AOC Abdominal Pain - Nausea - FTT   -Patient reports acute on chronic abdominal pain, 9/10. Last dose of oxycodone 20 mg at 0630 this morning. Will give 0.5 mg IV Dilaudid for breakthrough pain. I have also given her 20 mg p.o. oxycodone prior to ER transfer   -Patient reports her nausea is improved with IV Phenergan, but she became very anxious after our discussion regarding team's discussion against TPN. Encouraged patient to trial lorazepam to alleviate her anxiety as well as help with her nausea. Will give 1 mg PO Ativan x 1 dose. There certainly seems to be a psychological component playing a role in her overall health.    -S/p 1L NS, VSS afebrile  -Labs from 1/14 stable, did not repeat labs today  -Suspect patient will need additional supportive care measures with IV hydration, anti-emetics and pain management that cannot be adequately supported in the infusion area. Therefore, have advised patient present to the ER for continued care and monitoring. She was discharged from infusion via wheelchair in NAD accompanied by her mother. Both patient and mother verbalized  understanding of all and in agreement with plan. Primary oncology team aware of plan.   -Given degree of weight loss, recommend GI consult to discuss nutritional plans moving forward as patient and mother are adamantly requesting consideration of parenteral nutrition and to determine need for antibiotic cycling due to ongoing symptoms 2/2 pouchitis.       Objective      Physical Exam:  Vital Signs:  Vitals:    03/25/23 0831 03/25/23 1503   BP: 151/82 126/72   Pulse: 87 91   Temp: 36.8 ??C (98.2 ??F)    TempSrc: Oral    SpO2: 96%        General:  Healthy-appearing, resting in no acute distress.Marland Kitchen  HEENT: Normocephalic and atraumatic. No scleral icterus or conjunctival injection. Oral mucosa moist without ulceration, erythema, exudate or purpura. Nares show no bleeding.    Neck:  Supple. Trachea midline  Cardiovascular:  Regular rate, & rhythm. S1/S2 No significant murmurs or gallops. Bilateral radial pulses 2+ and symmetrical    Respiratory: Breathing is unlabored and patient is speaking full sentences with ease. No stridor. Auscultation of lung fields reveals normal air movement without wheezing, ronchi or crackles.   Gastrointestinal:  Abdomen soft, nontender. Bowel sounds present and normal in quality. No distention. Mild tenderness to palpation. No palpable hepatomegaly or splenomegaly. No palpable masses.  Musculoskeletal:  No bony pain or tenderness. No grossly-evident joint effusions or deformities. Range of motion about the shoulder, elbow, hips and knees is grossly normal.   Extremities: Lower extremities are warm and without edema. Distal pulses are full and symmetric. No clubbing, or cyanosis.  Skin and Subcutaneous Tissues: No rash, ecchymoses, purpuric lesions noted. Appropriately warm and dry  Psychiatric:  Alert and oriented to person, place, time and situation. Anxious.  Neuro: No focal deficits.       I personally spent 60 minutes face-to-face and non-face-to-face in the care of this patient, which includes all pre, intra, and post visit time on the date of service.         [name], A-GNP  [facility] PROVID[facility] FOR [facility]FUSION PROGRAM

## 2023-03-25 NOTE — Unmapped (Signed)
Additional documentation:    03/25/23: Contacted by the infusion APP regarding Kijana's request to be admitted due to dehydration, BRPBR, consideration for initiation of TPN.    Spoke at length with the APP, reached out to [facility] and Barton Fanny of the GI team, Maryagnes Amos, her mental health provider, as well as my nurse navigator to coordinate appropriate care.    Labs and vital signs again reviewed.  Hgb is normal - 13.9  CMP entirely normal, including renal function, electrolytes, albumin (4.0).  Iron studies demonstrate adequate iron stores.    Vitals include BP 127/88, HR 90. Afebrile.  Weight chart reviewed - currently 116 lbs. She was 115 on 03/08/23. 127 on 11/6, although that may have been in the setting of resolving volume overload that peaked with a weight of 153 lbs on 9/16. Prior to that admission, she was 119 lb on 10/26/22 (essentially unchanged from current)    Assessment / Plan:    BRBPR  I think it is difficult to make the case that she has a clinically significant GI bleed with stable vitals and hemoglobin. I suspect she has resolving pouchitis. I defer further management of that to her GI providers, but it would seem to be improving. She had a recent endoscopy and I do not think repeat endoscopy is indicated from my standpoint.    Nutrition  Regarding nutrition, it is also hard to say that she has clinically severe malnutrition at present. Normal albumin at present, she has certainly had labile weight but is unchanged from ~ 5 months prior.     Desmoid  Imaging demonstrates overall stability of her desmoid tumors over time. MRI compared with CT shows some areas that are more prominent (and others that have shrunk) but I do not think any of this is consistent with clinical progression. She did have evidence of progression prior to initiation of medical therapy (sorafenib, nirogacestat), thus I think continued treatment is indicated. I think her desmoids contribute to her issues (particularly the fixed bowel tethering) but are not the primary driver of her overall challenges. If her issues persist, I would consider stopping her nirogacestat to see if any of the symptoms (nausea, food aversion, pain, bleeding) improve off medication (although atypical compared to known drug related adverse events)    Psychosocial / Overall  This is a complex situation. She has real medical comorbid conditions that leave her vulnerable to significant complications - [name], gardner syndrome, desmoid fibromatosis, history of colectomy, recurrent episodes of bowel inflammation, infection, bleeding, obstruction.     She has pain syndrome that is difficult to precisely define - it exceeds typical expectations for desmoid induced pain, pain from bowel issues, etc, and may have some element of somatic or total pain that contributes.    She also has a lot of anxiety, depression, medically induced trauma. She has experienced tremendous functional debility from this constellation of medical and psychiatric challenges which has derailed her educational and career plans, and thus impaired her transition into independent adulthood. This has left her in a place where the sick role is in many ways the only role in society and within her family that she knows, leading her to sometimes seek potentially inappropriate health care that reinforces that role.    At this point, my recommendation (and my communication across the numerous specialties mentioned above) is to provide holistic mental health care, including both medications and psychotherapy regularly (led by Maryagnes Amos). We should maintain vigilance for the many potential  medical complications she is at risk for developing, but also try to minimize excessive medicalization, including avoiding unnecessary testing, hospitalizations, procedures when the risks of those interventions outweigh potential benefits (this component is being led by myself and the GI team led by Dr. [name]). I defer pain management to her pain providers (led by Dr. Darlis Loan) but would generally try to minimize opioids as able, which he and his team are capably doing. We should aim for consistency in her medical team, supporting her with regular visits, reiterating the overall plan of care and goals for recovery.     Today, I do not see an indication for hospitalization - or at least no presence of a medical issue that I can see that would be improved by a stay in the hospital. If she does not feel safe going home, of course that is within her rights to seek care in the ER, and if they deem admission to be indicated, we would be happy to support her and see her in house (either on Med O or via oncology consult service).    My nurse navigator will continue to provide the patient support. I will see her again in 1 month for follow up care, sooner if needed.    Donzetta Sprung, MD/MPH  Assistant Professor  Bone and Soft Tissue Oncology Program

## 2023-03-25 NOTE — Unmapped (Unsigned)
Chronic Pain Follow Up Note    Assessment and Plan    No diagnosis found.        [name] [name] is a 23 y.o. female with past medical history significant for DVT (RUE, s/p Eliquis x3 months), [name] syndrome s/p colectomy, desmoid tumor, anemia, and severe protein-calorie malnutrition who is being followed at [facility] for abdominal and right shoulder pain that is consistent with the diagnosis of cancer associated pain.    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 Pt has continued and now chronic pain related to her tumors, has R shoulder pain as well though this is better controlled now. Follows with [facility] Dr [name] as well as by [facility] oncology.    ***Weight Change Past 6 Months in Pounds (rounded): -21.2 at 03/25/2023  2:22 PM       Unfortunately, [name] [name] has been experiencing significantly worse abdominal pain over the last several months.  The etiology of her pain flare cycles is complex with likely multiple contributing factors.  The most likely explanation appears that she has intermittent pain flares that are caused by an unclear underlying process (potentially due to intermittent intestinal obstructions in the setting of her known mesenteric desmoid tumors, which may act as anchor points for torsion of her bowels) that resulted in an increased opioid requirement and often will lead to hospitalization.  At this point, her acute pain will be treated with IV opioids which will precipitate narcotic bowel syndrome that, in conjunction with her severe visceral hyperalgesia, quickly causes a new pain generator.  This results in a positive feedback loop with deleterious consequences in which she requires more IV opioids to treat her pain but the additional IV opioids further the underlying issue.    Visceral hyperalgesia, narcotic bowel syndrome, desmoid tumors of the gastrointestinal tract, cancer related pain, chronic pain syndrome, allodynia, generalized abdominal pain   Chronic, globally worsening; at this time patient believes pain has worsened since she started eating again and will gradually begin to improve.  Patient was recently admitted from July until October after having a severe pain flare resulting from significant shaking and pain that occurred after emerging from general anesthesia which she required for a steroid injection of a peripheral desmoid tumor.  She was hospitalized for many months, having only been out of the hospital for a short period of time prior to an earlier hospitalization over the summer, and her most recent hospital course was complicated by prolonged ileus, headaches, severe nausea, significant deconditioning and lower extremity weakness, bacteremia and fungemia, and prolonged acute inpatient rehab stay. Today, we will increase frequency of oxycodone (to take it 4-5 times a day instead of 3 times) and switch her to [place] and stop the suboxone per her preference as she has been averse to taking the suboxone due to medical PTSD and wants guaranteed analgesia in the form of transdermal Butrans, during this acute flare of pain due to increase Bms (15 a day) and she is closely following with GI since she was recently started on antibiotics which may worsen the Bms. We will follow up in 1 month to continue weaning opioids as able.     -Continue APAP 1000 mg 3 times daily  -Okay to continue ativan 0.5 mg p.o. prn for anxiety, patient will not take it at the same time as opioids or other sedating medications  -Refilled oxycodone 10  mg  4-5 times a day for the next 2 weeks during acute flare and will decrease back to 3 times a day as needed after that, #120 tabs; x 1 month with plans to decrease to BID at the next visit  -discontinued suboxone and start Butrans 20mg  weekly   -Still working to wean off of all opioids      Of note: The patient has received both ketamine infusions and lidocaine infusions as an inpatient for flareups of her chronic abdominal pain and states that lidocaine infusions have been significantly more helpful in the past, though she is also tolerated ketamine infusions. Would recommend this in future hospitalizations to treat severe and uncontrolled abdominal pain exacerbations -previous lidocaine infusion was intermittently on due to transaminitis experienced by the patient which may or may not have been from the infusion but from TPN or an intra-abdominal process that was occurring.  She states that she did really well after the lidocaine infusion for a couple of weeks.    Myofascial pain ***  Chronic, ongoing, at goal. Not primary concern at this time. Patient continues to have subacute low back pain after having hyper flexion of her hip in the hospital. Patient states the hematoma has not changed from previous. At previous appointment, on exam an approximately 5 inches x 4 inches with no skin discoloration at this time but a hard subcutaneous nodule overlying the SI joint.  [name] [name] was positive bilaterally.  Favor either hematoma versus gluteal tear versus SI joint pain.    - Consider repeat TPI PRN, though will hold off given her severe pain flare after recent desmoid injection  - Home exercise program  - Encouraged SI joint exercises  - Encouraged slow resumption of normal activity and times when pain has decreased  - Apply Voltaren gel BID to limit GI side effects  - Apply Lidocaine patches    Chronic, continuous use of opioids; chronic pain syndrome ***  Patient is managed on both Suboxone for long-acting pain control as a partial mu agonist and oxycodone for pain flares.  Working to wean patient off of oxycodone and then to work patient down and eventually off of buprenorphine to improve bowel function while still balancing the need for pain control.  Patient is due for an updated UDS and opioid agreement today as well as an updated Narcan prescription.  Patient is currently managed on a benzodiazepine for anxiety and muscle spasms but notes that she is only taking 1 pill three times a week and knows not to take this around the time of her opioid and is working to wean this off as well.    -COMM score: 2    Medication Monitoring  [place] PDMP was reviewed today and it was appropriate.  Patient reported pill counts at home and they were appropriate  Last urine toxicology screen: Updated 12/23/2022  Treatment agreement last reviewed and signed: Updated 12/23/2022  Aberrancy with treatment plan? no  Opioid Risk: moderate (benzo)  Pain psychology: no  Benzodiazepines: yes, knows not to take alongside opioids  Naloxone: Yes, refilled on 12/23/2022  EKG: NSR with normal Qtc (437 on 11/01/2022)  I have reviewed the [facility] Medical Board statement on use of controlled substances for the treatment of pain as well as the CDC Guideline for Prescribing Opioids for Chronic Pain. I have reviewed the [place] Controlled Substance Monitoring Database.    Long Term Use of Opioid Medication  - Compliant, using opioid to facilitate functional goals, no concerns for misuse  PDMP reviewed 02/18/2023 Appropriate   Pill count 02/18/2023 Appropriate 42 tabs of oxycodone left  49 suboxone films left    Urine toxicology 12/23/22 Appropriate +oxy, +benzo, +suboxone   Treatment agreement 02/18/23 Goals: continue with nursing school   Naloxone rx Date 12/23/22      COMM Date Score: 4   Benzodiazepine Yes Ok with minimal use per psychiatry, pt will avoid taking close to opioid doses and only while awake, not near bedtime   Pain Psychology No     QTC 11/01/22 NSR, Qtc 437   Concerns yes OIH, planning to wean off opioids, planning to minimize benzo use   Opioid Risk LOW        Opioid Changes and Yearly MME  Date MME Comments   12/23/22 45 + 1.5 mg BID of Suboxone Post-discharge regimen, increased suboxone to decrease oxy   01/21/23 45 + 1 mg TID Suboxone + additional 10 mg oxy per day x 10 days Split Suboxone to TID dosing for pain, gave additional #10 oxy 10 mg for additional 10 mg oxy per day for 10 days after wisdom tooth extractions   02/18/23 75 + 20 mcg/h Butrans Increased to oxy 10 mg x4-5 per day for 2-3 weeks given patient's current pain flare in setting of dramatically increased BM frequency, switched to Butrans given pt's anxiety surrounding Suboxone and desire for more consistent analgesia with Butrans that will also minimize pill burden; plan to wean off of oxy hereafter, Lavera Guise is a Scientist, product/process development net decrease from Suboxone                             No follow-ups on file.    PLAN:  Today I have prescribed:  Requested Prescriptions      No prescriptions requested or ordered in this encounter     No orders of the defined types were placed in this encounter.     HPI  Luvina Magallon is a 23 y.o. being followed at [facility] for complaint of chronic pain localized to her shoulders and abdomen.      Patient was last seen in December via telemedicine, at which point the patient reported not doing well over the past 4-5 days. She had been experiencing significant trouble with diet and feeding, losing about 10 lbs in approximately 1.5 weeks, and reported frequent bathroom trips, mostly stool, about 15 times overnight. She was unsure what triggered this episode but mentioned the stress of school with classes as a potential factor. She also reported not sleeping over the past few days due to the frequent bathroom trips. For pain management, she had been taking extra oxycodone, with a maximum of about four pills per day. She noted that she was previously doing well and had traveled to [place] to step away from her regular environment, where she had a great time without needing extra pain medications. She stated she was able to push herself to do more and felt very proud of this.    Since last visit patient presented to the hospital in regards to chronic pain due to neoplasm and underwent an ENDOSCOPIC EVAL OF SMALL INTESTINAL POUCH; DIAGNOSTIC, WITH BIOPSY on 03/13/23. Patient also followed with psychiatry.     Today, ***    Current analgesic regimen:  - Butrans 20mg  weekly   -Oxycodone 10mg  q8 PRN  -P.o. Valium 5 mg nightly as needed  -Tylenol PRN  -Voltaren 1% gel  Current view: Showing all answers           [facility] Video Visit Questionnaire       Question 03/23/2023  1:26 PM EST - Filed by Patient 02/26/2023 10:47 AM EST - Filed by Patient 02/18/2023  1:22 PM EST - Filed by Patient    Virtual Care Guidelines Accept Accept Accept          Welcome Presumptive Financial Assistance Screening       Question 03/24/2023 12:32 PM EST - Incomplete    At any time in the past 12 months, were you homeless or living in a shelter (including now)? No    Are you actively enrolled in a public assistance program based on household income? No          The patient states her pain is located *** and the severity of her pain ranges from ***/10 to ***/10.  Her pain currently is ***/10 and on average is ***/10.  She describes the sensation of her pain as {pain described:58928}. Her pain is present {pain frequency:58931} and worst {pain worst:59685}. The patient???s pain impacts {negatively affected:59709}. Her interval history includes {interval history:59710}. Her pain {pain changed:59711}, and she {does does not blank:47747} have new pain to discuss today. She {is/is not:36772} on blood thinners or anti-coagulants. In regards to medications currently taken for pain management, the patient {is:54246::is} tolerating these medications well and complains of associated side effects: {pain med side effects:59712}.    Previous Medication Trials:  NSAIDS: Toradol (c/f GIB after)  Antidepressant/anxiety: Cymbalta (stopped due to mood side effects), amitriptyline (insomnia and increased J-pouch output)  Anticonvulsants: Gabapentin, Lyrica (both cause lower extremity edema)  Muscle relaxants: Robaxin, baclofen (most effective of the conventional therapies), p.o. Valium (attempting to limit due to opioid dependence)  Topicals: lidocaine, voltaren gel  Short-acting opiates: oxycodone, PO HM  Long-acting opiates: Buprenorphine  Other: Tylenol    Previous Interventions  Procedures:   - TPIs in August 2023 with initial pain flare, subsequent improvement, and near immediate relief flare of pain; hyperalgesic with procedure  - TPIs of Right trapezius, rhomboid, infraspinatus on 12/04/2021.  - TPIs on 10/25  - Celiac plexus block on 11/1 (severe post-procedural pain flare requiring admission for pain control, significant hyperalgesia)  - TPIs 11/22    Infusions:  Lidocaine infusions helped during inpatient exacerbations of pain more than ketamine infusions    PT: Yes    Allergies  Allergies   Allergen Reactions    Adhesive Tape-Silicones Hives and Rash     Paper tape and Tegederm ok. Biopatch causes blistering but has tolerated CHG Tegaderm    Ferrlecit [Sodium Ferric Gluconat-Sucrose] Swelling and Rash    Levofloxacin Swelling and Rash     Swelling in mouth, rash,     Methylnaltrexone      Per Patient: I lost bowel control, severe abdominal cramping, and elevated BP    Neomycin Swelling     Rxn after ear drops; ear swelling    Papaya Hives    Morphine Nausea And Vomiting    Zosyn [Piperacillin-Tazobactam] Itching and Rash     Red and itchy    Compazine [Prochlorperazine] Other (See Comments)     Extreme agitation    Iron Analogues     Reglan [Metoclopramide Hcl] Diarrhea    Iron Dextran Itching     Received iron dextran 06/08/22 over 12 hours, had itching and redness/flushing during the infusion and for a couple days after. Required IV benadryl w/flares between doses and  ultimately treated w/IV methylpred for 2 days.     Latex, Natural Rubber Rash       Home Medications    Current Outpatient Medications   Medication Sig Dispense Refill    acetaminophen (TYLENOL) 500 MG tablet Take 2 tablets (1,000 mg total) by mouth every eight (8) hours.      buprenorphine (BUTRANS) 20 mcg/hour PTWK transdermal patch Place 1 patch on the skin every seven (7) days. 4 patch 1    cefdinir (OMNICEF) 300 MG capsule Take 1 capsule (300 mg total) by mouth every twelve (12) hours for 19 days, THEN 1 capsule (300 mg total) daily for 28 days. 66 capsule 0    cetirizine (ZYRTEC) 10 MG tablet Take 2 tablets (20 mg total) by mouth two (2) times a day for 7 days. 28 tablet 0    cholecalciferol, vitamin D3 25 mcg, 1,000 units,, 1,000 unit (25 mcg) tablet Take 1 tablet (25 mcg total) by mouth daily. 100 tablet 3    COURIERED MED OR SUPPLY OGSVEO 50 MG TABLET. 1 each 0    diphenhydrAMINE (BENADRYL) 25 mg tablet Take 2 tablets (50 mg total) by mouth two (2) times a day as needed for itching for up to 7 days. 100 tablet 0    escitalopram oxalate (LEXAPRO) 5 MG tablet Take 1 tablet (5 mg total) by mouth daily. 30 tablet 0    famotidine (PEPCID) 20 MG tablet Take 1 tablet (20 mg total) by mouth nightly. 30 tablet 3    fluticasone propionate (FLONASE) 50 mcg/actuation nasal spray Use 1 spray under the butrans patch once every 7 days to prevent allergic reaction to the adhesive      hydrocortisone 2.5 % cream Apply topically two (2) times a day as needed (hemorrhoids). 30 g 0    lidocaine (ASPERCREME) 4 % patch Place 1 patch on the skin daily as needed.      LORazepam (ATIVAN) 1 MG tablet Take 1 tablet (1 mg total) by mouth two (2) times a day as needed for anxiety. 60 tablet 0    naloxegol (MOVANTIK) 12.5 mg tablet Take 1 tablet (12.5 mg total) by mouth daily. 30 tablet 0    naloxone (NARCAN) 4 mg nasal spray One spray in either nostril once for known/suspected opioid overdose. May repeat every 2-3 minutes in alternating nostril til EMS arrives 2 each 0    nirogacestat (OGSIVEO) 50 mg tablet Take 3 tablets (150 mg total) by mouth two (2) times a day. Swallow tablets whole; do not break, crush, or chew. 180 tablet 5    ondansetron (ZOFRAN-ODT) 8 MG disintegrating tablet Dissolve 1 tablet (8 mg total) in the mouth every eight (8) hours as needed for nausea. 90 tablet 0    oxyCODONE (ROXICODONE) 5 mg/5 mL solution Take 20 mL (20 mg total) by mouth every four (4) hours as needed for up to 7 days. 840 mL 0    pantoprazole (PROTONIX) 40 MG tablet Take 1 tablet (40 mg total) by mouth daily before breakfast. 30 tablet 0    pimecrolimus (ELIDEL) 1 % cream Apply topically two (2) times a day as needed. To face as needed for rash      predniSONE (DELTASONE) 20 MG tablet Take 2 tablets (40 mg total) by mouth daily for 4 days. 8 tablet 0    promethazine (PHENERGAN) 12.5 MG tablet Take 1 tablet (12.5 mg total) by mouth every twelve (12) hours as needed for nausea. 14 tablet 0  scopolamine (TRANSDERM-SCOP) 1 mg over 3 days Place 1 patch (1 mg total) on the skin every third day. 10 patch 2    sucralfate (CARAFATE) 100 mg/mL suspension Take 10 mL (1 g total) by mouth four (4) times a day as needed.      triamcinolone (KENALOG) 0.1 % cream Apply topically two (2) times a day as needed. Apply to rash as needed      zinc oxide-cod liver oil (DESITIN 40%) 40 % Pste Apply topically daily as needed. 57 g 0     No current facility-administered medications for this visit.       Review Of Systems  General {ajlrosgen:46920}  Cardiovascular {aggROSCV:37303}  Gastrointestinal {aggROSGI:37304}  Skin {aggROSskin:37305}  Endocrine {aggROSendo:37306}  Musculoskeletal {aggROSmusk:37307}  Neurologic {aggROSneu:37308}  Psychiatric {aggROSpsych:37309}      Physical Exam    VITALS:   There were no vitals filed for this visit.            Wt Readings from Last 3 Encounters:   03/24/23 52.8 kg (116 lb 6.4 oz)   03/17/23 53.5 kg (118 lb)   02/24/23 54.1 kg (119 lb 4.8 oz)     GENERAL:  The patient is well developed, well-nourished and appears to be in no apparent distress. The patient is pleasant and interactive. Patient is a good historian.  HEENT:    Reveals normocephalic/atraumatic. Clear sclera. Mucous membranes are moist.  RESPIRATORY:   Normal work of breathing.  No supplemental oxygen.  GASTROINTESTINAL:   ***  NEUROLOGIC:    The patient was alert and oriented, speech fluent, normal language.   MUSCULOSKELETAL:    ***    SKIN:   No obvious rashes, lesions, or erythema.  PSYCHIATRIC:  Appropriate mood and affect.  No pressured speech.

## 2023-03-25 NOTE — Unmapped (Signed)
Sent down from oncology clinic. Reports complicated oncology history, on chemo. Per note in chart: pt unable to tolerate PO and thinks may need TPN. Also w/ bloody BMs. Oncology and GI unsure if admission is warranted, but sent to ED for further workup and evaluation as she does not feel safe to return home at this time.

## 2023-03-25 NOTE — Unmapped (Signed)
INFUSION:  Diagnosis: Desmoid Fibromatosis  Provider: [name] MD  Treatment needs today: IVF   Transportation: Private Vehicle  Code Status: Full Code    Nursing Assessment:  Patient Complains of nausea. Unable to really eat or drink anything. Was hospitalized for 2 weeks where she lost 20+ pounds. Patient wanting to be admitted. Fonnie Birkenhead  NP to bedside to talk with patient    Vascular Access:  PIV assessed per protocol. Positive blood return noted. See flowsheet for further detail.     INFUSION/SUPPORTIVE PLAN  1L NS bolus given for hydration.   Phenergan IV push given for nausea per [facility] instructions  Dilaudid IV given for 9/10 pain per [facility] instructions.   20mg  PO oxycodone also given for pain.    Discharge:  Patient discharged Using Wheelchair in stable condition to the ED    Claudean Kinds RN

## 2023-03-25 NOTE — Unmapped (Signed)
VENOUS ACCESS TEAM PROCEDURE    Nurse request was placed for a PIV by Venous Access Team (VAT).  Patient was assessed at bedside for placement of a PIV. PPE were donned per protocol.  Access was obtained. Blood return noted.  Dressing intact and device well secured.  Flushed with normal saline.  See LDA for details.  Pt advised to inform RN of any s/s of discomfort at the PIV site.    Workup / Procedure Time:  30 minutes       Care RN was notified.       Thank you,     Marylee Floras, RN Venous Access Team

## 2023-03-25 NOTE — Unmapped (Signed)
RED ZONE Means: RED ZONE: Take action now!     You need to be seen right away  Symptoms are at a severe level of discomfort    Call 911 or go to your nearest  Hospital for help     - Bleeding that will not stop    - Hard to breathe    - New seizure - Chest pain  - Fall or passing out  -Thoughts of hurting    yourself or others      Call 911 if you are going into the RED ZONE                  YELLOW ZONE Means:     Please call with any new or worsening symptom(s), even if not on this list.  Call [phone]  After hours, weekends, and holidays - you will reach a long recording with specific instructions, If not in an emergency such as above, please listen closely all the way to the end and choose the option that relates to your need.   You can be seen by a provider the same day through our Same Day Acute Care for Patients with Cancer program.      YELLOW ZONE: Take action today     Symptoms are new or worsening  You are not within your goal range for:    - Pain    - Shortness of breath    - Bleeding (nose, urine, stool, wound)    - Feeling sick to your stomach and throwing up    - Mouth sores/pain in your mouth or throat    - Hard stool or very loose stools (increase in       ostomy output)    - No urine for 12 hours    - Feeding tube or other catheter/tube issue    - Redness or pain at previous IV or port/catheter site    - Depressed or anxiety   - Swelling (leg, arm, abdomen,     face, neck)  - Skin rash or skin changes  - Wound issues (redness, drainage,    re-opened)  - Confusion  - Vision changes  - Fever >100.4 F or chills  - Worsening cough with mucus that is    green, yellow, or bloody  - Pain or burning when going to the    bathroom  - Home Infusion Pump Issue- call    [phone]         Call your healthcare provider if you are going into the YELLOW ZONE     GREEN ZONE Means:  Your symptoms are under controls  Continue to take your medicine as ordered  Keep all visits to the provider GREEN ZONE: You are in control  No increase or worsening symptoms  Able to take your medicine  Able to drink and eat    - DO NOT use MyChart messages to report red or yellow symptoms. Allow up to 3    business days for a reply.  -MyChart is for non-urgent medication refills, scheduling requests, or other general questions.         [id number] Rev. 09/06/2021  Approved by Oncology Patient Education Committee     No visits with results within 1 Day(s) from this visit.   Latest known visit with results is:   Lab on 03/24/2023   Component Date Value Ref Range Status    Sodium 03/24/2023 143  135 - 145 mmol/L Final    Potassium  03/24/2023 3.8  3.5 - 5.1 mmol/L Final    Chloride 03/24/2023 102  98 - 107 mmol/L Final    CO2 03/24/2023 29.0  20.0 - 31.0 mmol/L Final    Anion Gap 03/24/2023 12  5 - 14 mmol/L Final    BUN 03/24/2023 10  9 - 23 mg/dL Final    Creatinine 57/32/2025 0.55  0.55 - 1.02 mg/dL Final    BUN/Creatinine Ratio 03/24/2023 18   Final    eGFR CKD-EPI (2021) Female 03/24/2023 >90  >=60 mL/min/1.84m2 Final    eGFR calculated with CKD-EPI 2021 equation in accordance with [employer] and [employer] of Nephrology Task Force recommendations.    Glucose 03/24/2023 95  70 - 179 mg/dL Final    Calcium 42/70/6237 10.2  8.7 - 10.4 mg/dL Final    Albumin 62/83/1517 4.0  3.4 - 5.0 g/dL Final    Total Protein 03/24/2023 7.2  5.7 - 8.2 g/dL Final    Total Bilirubin 03/24/2023 0.4  0.3 - 1.2 mg/dL Final    AST 61/60/7371 18  <=34 U/L Final    ALT 03/24/2023 17  10 - 49 U/L Final    Alkaline Phosphatase 03/24/2023 72  46 - 116 U/L Final    Iron 03/24/2023 75  50 - 170 ug/dL Final    TIBC 09/03/9483 238 (L)  250 - 425 ug/dL Final    Iron Saturation (%) 03/24/2023 32  20 - 55 % Final    WBC 03/24/2023 11.4 (H)  3.6 - 11.2 10*9/L Final    RBC 03/24/2023 5.37 (H)  3.95 - 5.13 10*12/L Final    HGB 03/24/2023 13.9  11.3 - 14.9 g/dL Final    HCT 46/27/0350 42.5  34.0 - 44.0 % Final    MCV 03/24/2023 79.0  77.6 - 95.7 fL Final MCH 03/24/2023 25.9  25.9 - 32.4 pg Final    MCHC 03/24/2023 32.8  32.0 - 36.0 g/dL Final    RDW 09/38/1829 15.8 (H)  12.2 - 15.2 % Final    MPV 03/24/2023 9.8  6.8 - 10.7 fL Final    Platelet 03/24/2023 250  150 - 450 10*9/L Final    Neutrophils % 03/24/2023 67.4  % Final    Lymphocytes % 03/24/2023 19.3  % Final    Monocytes % 03/24/2023 8.5  % Final    Eosinophils % 03/24/2023 4.5  % Final    Basophils % 03/24/2023 0.3  % Final    Absolute Neutrophils 03/24/2023 7.7  1.8 - 7.8 10*9/L Final    Absolute Lymphocytes 03/24/2023 2.2  1.1 - 3.6 10*9/L Final    Absolute Monocytes 03/24/2023 1.0 (H)  0.3 - 0.8 10*9/L Final    Absolute Eosinophils 03/24/2023 0.5  0.0 - 0.5 10*9/L Final    Absolute Basophils 03/24/2023 0.0  0.0 - 0.1 10*9/L Final    Microcytosis 03/24/2023 Slight (A)  Not Present Final    Hypochromasia 03/24/2023 Moderate (A)  Not Present Final

## 2023-03-25 NOTE — Transitions of Care (Post Inpatient/ED Visit) (Signed)
   03/25/2023  Name: Lisa Donovan MRN: [id number] DOB: 08/08/2000  Today's TOC FU Call Status: Today's TOC FU Call Status:: Unsuccessful Call (3rd Attempt) Unsuccessful Call (3rd Attempt) Date: 03/25/23  Attempted to reach the patient regarding the most recent Inpatient/ED visit.  Follow Up Plan: Additional outreach attempts will be made to reach the patient to complete the Transitions of Care (Post Inpatient/ED visit) call.   Cecilie Coffee [facility], BSN RN Care Manager/ Transition of Care [place]/ [facility] [phone]

## 2023-03-26 DIAGNOSIS — D48119 Desmoid tumor: Principal | ICD-10-CM

## 2023-03-26 LAB — URINALYSIS WITH MICROSCOPY WITH CULTURE REFLEX PERFORMABLE
BILIRUBIN UA: NEGATIVE
BLOOD UA: NEGATIVE
GLUCOSE UA: NEGATIVE
KETONES UA: 20 — AB
LEUKOCYTE ESTERASE UA: NEGATIVE
NITRITE UA: NEGATIVE
PH UA: 7.5 (ref 5.0–9.0)
PROTEIN UA: NEGATIVE
RBC UA: 1 /HPF (ref <=4)
SPECIFIC GRAVITY UA: 1.014 (ref 1.003–1.030)
SQUAMOUS EPITHELIAL: <1 /HPF (ref 0–5)
UROBILINOGEN UA: <2
WBC UA: <1 /HPF (ref 0–5)

## 2023-03-26 LAB — PREGNANCY, URINE: PREGNANCY TEST URINE: NEGATIVE

## 2023-03-26 MED ADMIN — HYDROmorphone (PF) (DILAUDID) injection 1 mg: 1 mg | INTRAVENOUS | @ 19:00:00 | Stop: 2023-03-26

## 2023-03-26 MED ADMIN — HYDROmorphone (PF) (DILAUDID) injection 1 mg: 1 mg | INTRAVENOUS | @ 12:00:00 | Stop: 2023-03-26

## 2023-03-26 MED ADMIN — lactated Ringers infusion: 75 mL/h | INTRAVENOUS | @ 19:00:00 | Stop: 2023-03-27

## 2023-03-26 MED ADMIN — HYDROmorphone (PF) (DILAUDID) injection 1 mg: 1 mg | INTRAVENOUS | @ 07:00:00 | Stop: 2023-03-26

## 2023-03-26 MED ADMIN — scopolamine (TRANSDERM-SCOP) 1 mg over 3 days topical patch 1 mg: 1 | TOPICAL | @ 15:00:00

## 2023-03-26 MED ADMIN — oxyCODONE (ROXICODONE) 5 mg/5 mL solution 20 mg: 20 mg | ORAL | @ 21:00:00 | Stop: 2023-04-09

## 2023-03-26 MED ADMIN — promethazine (PHENERGAN) 12.5 mg in sodium chloride (NS) 0.9 % 50 mL IVPB: 12.5 mg | INTRAVENOUS | @ 04:00:00

## 2023-03-26 MED ADMIN — ondansetron (ZOFRAN) injection 4 mg: 4 mg | INTRAVENOUS | @ 12:00:00 | Stop: 2023-03-26

## 2023-03-26 MED ADMIN — HYDROmorphone (PF) (DILAUDID) injection 1 mg: 1 mg | INTRAVENOUS | @ 05:00:00 | Stop: 2023-03-26

## 2023-03-26 MED ADMIN — lidocaine (ASPERCREME) 4 % 1 patch: 1 | TRANSDERMAL | @ 15:00:00

## 2023-03-26 MED ADMIN — HYDROmorphone (PF) (DILAUDID) injection 1 mg: 1 mg | INTRAVENOUS | @ 02:00:00 | Stop: 2023-03-26

## 2023-03-26 MED ADMIN — HYDROmorphone (PF) (DILAUDID) injection 1 mg: 1 mg | INTRAVENOUS | @ 15:00:00 | Stop: 2023-03-26

## 2023-03-26 MED ADMIN — lactated Ringers infusion: 100 mL/h | INTRAVENOUS | @ 05:00:00 | Stop: 2023-03-26

## 2023-03-26 MED ADMIN — ondansetron (ZOFRAN) injection 8 mg: 8 mg | INTRAVENOUS | @ 19:00:00

## 2023-03-26 MED ADMIN — HYDROmorphone (PF) (DILAUDID) injection 1 mg: 1 mg | INTRAVENOUS | Stop: 2023-03-26

## 2023-03-26 MED ADMIN — buprenorphine 20 mcg/hour transdermal patch 1 patch: 1 | TRANSDERMAL | @ 05:00:00

## 2023-03-26 MED ADMIN — promethazine (PHENERGAN) 12.5 mg in sodium chloride (NS) 0.9 % 50 mL IVPB: 12.5 mg | INTRAVENOUS | @ 16:00:00

## 2023-03-26 MED ADMIN — fluticasone propionate (FLONASE) 50 mcg/actuation nasal spray 1 spray: 1 | TOPICAL | @ 05:00:00

## 2023-03-26 NOTE — Unmapped (Signed)
Luminal Gastroenterology Consult Service   Initial Consultation         Assessment and Recommendations:   [name] [name] is a 23 y.o. 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 The patient is seen in consultation at the request of  Jones Skene, DO (Emergency Medicine) for concern for poor response of pouchitis to antibiotics and management of poor PO intake.    Poor PO intake iso chronic abdominal pain   She has been working with nutrition as an outpatient. However, she has been experiencing weight loss of 6lbs over last week and nearly 30lbs since September. She continues to feel she is in need of TPN, although she has a history of a line infection. She feels constantly nauseated chronically but has been worse over the last 48 hours, which has been causing significant food aversion. Reassuringly, albumin 3.7. During previous hospitalization, nutrition calculated a non-severe moderate protein-calorie malnutrition. Will work on controlling nausea and pain to work towards PO diet before consideration of more invasive or extensive measures.  - Monitor weight and nutritional intake closely with daily weights  - Recommend Nutrition consult  - Schedule IV zofran 8 mg q8hrs  - Continue IV promethazine 12.5 mg q6hrs PRN, intractable nausea  - Appreciate chronic pain team's recommendation for pain control  - Would monitor QTC with EKG    Wt Readings from Last 12 Encounters:   03/25/23 51.3 kg (113 lb)   03/24/23 52.8 kg (116 lb 6.4 oz)   03/17/23 53.5 kg (118 lb)   02/24/23 54.1 kg (119 lb 4.8 oz)   01/23/23 57.4 kg (126 lb 9.6 oz)   01/21/23 58 kg (127 lb 12.8 oz)   01/14/23 58 kg (127 lb 12.8 oz)   12/16/22 58.5 kg (129 lb)   12/16/22 58.5 kg (129 lb) 12/08/22 61.3 kg (135 lb 3.2 oz)   11/14/22 61 kg (134 lb 8 oz)   09/24/22 62.4 kg (137 lb 9.6 oz)     Hematochezia - Pouchitis  She was recently hospitalized on the inpatient solid oncology service with hematochezia and c/f for recurrent pouchitis from 03/08/23 to 03/20/23. Pouchoscopy on 1/3 showed intact pouch with minimal inflammation and several superficial ulcerations at the pouch anastamosis and pre-pouch inlet, suggestive of healing pouchitis. Biopsies with moderately active chronic enteritis, no CMV, granuloma, or dysplasia (consistent with pouchitis). She was started on cefdinir 300 mg BID for 4 weeks which was supposed to transition to daily for 4 weeks, with improvement in hematochezia before discharge. Plan was for GI follow-up 12/22. Since discharge, she has experienced bowel movement frequency of 7-10 x daily, the last of which have been watery without food particles with bright red blood. Hgb has remained stable at 12.8 (just below baseline ~13-13.9).   - Monitor for dehydration and provide fluids as needed  - Continue PO cefdinir 300 mg BID  - Strict I/Os   - Daily CBC  - Will defer pouchoscopy due to reassuring recent scope on 03/13/23  - Communicating with outpatient GI Dr. Britt Bolognese       Issues Impacting Complexity of Management:  -None    Recommendations discussed with the patient's primary team. We will continue to follow along with you.    For questions, contact the on-call fellow for the Luminal Gastroenterology Consult  Service. Plan discussed with Dr. Zetta Bills.    Kathrin Penner, MD  Internal Medicine PGY1      History of Present Illness:     She presents after discharging from a recent hospitalization from 03/08/23 to 03/20/23 on the inpatient solid oncology service with hematochezia and c/f for recurrent pouchitis. Pouchoscopy was performed on 1/3 during that hospitalization that showed intact pouch with minimal inflammation and a few superficial ulcerations at the pouch anastamosis and pre-pouch inlet, suggestive of healing pouchitis. Biopsies with moderately active chronic enteritis, no CMV, granuloma, or dysplasia (consistent with pouchitis). She was started on cefdinir 300 mg BID for 4 weeks which was supposed to transition to daily for 4 weeks, with improvement in hematochezia before discharge. Plan was for GI follow-up 12/22.     Since discharge, she has experienced bowel movement frequency of 7-10 x daily (increased from around 5 a day during hospitalization), the last of which have been watery without food particles with bright red blood. She reports for the last 48 hours she has had severe 10/10 pain but has not been able to take her PO oxycodone for 2 days. She has been intractably nauseous and has been avoiding oral meds and food. She has not had any episodes of emesis due to fear of vomiting, so she has instead been avoiding PO intake. She had about 4 bites of food today and yesterday with only sips of half a Pediasure. Her last solid meal was about 4-5 days ago. She had a medical oncology appt with Dr. [name] two days ago, where they discussed the pain and malnutrition. She and her mom state that they had discussions about needing a port for fluids and other medications as well as TPN.  She feels she is becoming very weak and malnourished, feeling dizzy upon standing. In the past she feels that TPN was a solution to her malnutrition.    Of note, she had a hospitalization from 09/25/2022-12/10/2022 at [facility] for ileus, inability to tolerate PO transitioned to supplemental tube feeds, and diffuse pain due to muscle spasms of uncertain etiology. She was discharged home 12/10/22 with a corpak in place and tube feeds. She was initially in acute care then transitioned to rehab.      -I have reviewed the patient's prior records from Epic including prior hospitalizations as summarized in the HPI      Objective:   Temp:  [36.6 ??C (97.9 ??F)-37.3 ??C (99.1 ??F)] 37.3 ??C (99.1 ??F)  Heart Rate:  [65-89] 78  SpO2 Pulse:  [65-93] 70  Resp:  [7-15] 12  BP: (106-144)/(62-81) 136/81  SpO2:  [95 %-98 %] 97 %    Recent Labs  Lab Results   Component Value Date    WBC 8.4 03/25/2023    HGB 12.8 03/25/2023    HCT 37.1 03/25/2023    PLT 210 03/25/2023       Lab Results   Component Value Date    NA 140 03/25/2023    NA 142 03/25/2023    K 3.4 03/25/2023    K 3.4 03/25/2023    CL 108 (H) 03/25/2023    CO2 28.0 03/25/2023    BUN 7 (L) 03/25/2023    CREATININE 0.56 03/25/2023    GLU 90 03/25/2023    CALCIUM 9.4 03/25/2023    MG 2.2 03/25/2023    PHOS 3.5 03/25/2023       Lab Results   Component Value Date    BILITOT 0.3 03/25/2023  BILIDIR <0.10 03/17/2023    PROT 6.9 03/25/2023    ALBUMIN 3.7 03/25/2023    ALT 13 03/25/2023    AST 17 03/25/2023    ALKPHOS 63 03/25/2023    GGT 13 10/31/2011       Lab Results   Component Value Date    PT 12.7 (H) 03/25/2023    INR 1.11 03/25/2023    APTT 33.7 03/25/2023     Physical Exam:  General appearance - Fatigued-appearing young woman visibly uncomfortable  Mental status - Alert and oriented to person, place and time.   Head - normocephalic, atraumatic  EENT - Dry mucous membranes, ulcerations at corners of mouth  Chest - clear to auscultation bilaterally, no wheezes, rales or rhonchi  Heart - normal rate, regular rhythm, no murmurs  Gastrointestinal - abdomen soft, non-distended, tender to palpation in LLQ and midline lower abdomen, surgical scars intact  Extremities - peripheral pulses normal, no pedal edema, no clubbing or cyanosis  Skin - normal coloration, no rashes or lesions noted on clothed exam      Pertinent Imaging / Studies:    Pouchoscopy biopsy:  A: Small bowel, pouch, biopsy  - Erosive moderately active chronic enteritis  - No CMV viral cytopathic effect, granuloma, or dysplasia identified    Pouchoscopy 1/3:   Intact pouch with minimal inflammation and several superficial ulcerations at the pouch anastamosis and pre-pouch inlet, suggestive of healing pouchitis.     03/14/2023 KUB:  Mild gaseous distention of pelvic small bowel, stable.    01/23/2023 MRI A/P:  - Overall slightly increased size of the ill-defined enhancing soft tissue in the central lower mesentery extending into the pelvis/presacral region with associated small bowel tethering corresponding to desmoid tumor.   - Sequela of proctocolectomy and ileoanal anastomosis with grossly similar dilation of bowel proximal to the anastomosis.   -Three enhancing soft tissue lesions in the right rectus musculature, the largest of which is smaller in size compared to 11/11/2022.  - Slightly increased size of multiple prominent central mesenteric lymph nodes measuring up to 1.0 cm which may be reactive.  - Increased size of a nonenhancing 1.9 cm left interpolar parapelvic cyst with layering internal hemorrhagic debris.  - Marked narrowing of the left renal vein as it crosses between the SMA and aorta, correlate clinically for signs of nutcracker syndrome.  - Findings suggestive of iron deposition within the liver.  - Numerous cystic foci along the pelvic wall without definite enhancement as described in the body of the report, correlate with physical examination.     Other scopes:  Pouch exam 07/2022 - normal ileum, rectal cuff appeared normal with the exception of small rectal polpys, which were treated with APC   Pouch exam 03/2022 - friable mucosa a ileoanal pouch suture line, treated with APC   Pouch exam 02/2022 - anal fissure, 10mm circumferential ulcer oozing at the ileocolonic anastomosis, treated with APC   Pouch exam 01/2022 - anal fissure, ulcer at ileocolonic anastomosis   EGD 01/2022 - multple polyps in the stomach, normal esophagus, duodenum and jejunum   EGD 08/2021- fundic gland polyps in the stomach, otherwise normal  EGD 2022- fundic gland polyps in the stomach, adenomatous tissue removed from small bowel on random biopsy to rule out celiac disease (none seen endoscopically)

## 2023-03-26 NOTE — Unmapped (Signed)
ED Progress Note    Received sign out from previous provider.    Patient Summary: [name] [name] is a 23 y.o. female with a complex medical history including [name] syndrome post colectomy, desmoid tumors, DVT in the right upper extremity, anemia, severe protein calorie mid nutrition, follow with [place] [facility] for chronic abdominal/extremity pain related to desmoid tumors.  Presenting for acute on chronic pain exacerbation.  Pending admission for chronic pain, as well as hematochezia in the setting of pouchitis.  Pending team assignment.  Action List:   team    Updates  ED Course as of 03/26/23 0648   Wed Mar 25, 2023   2105 Pending admit orders.    Thu Mar 26, 2023   0309 [facility] assigned   [phone] Signed out to oncoming physician at 0700.  Patient is pending admission for pain control.  [facility] is following.  Home medications ordered.

## 2023-03-26 NOTE — Unmapped (Addendum)
[name] [name] is a 23 year old female with a complex medical history, including [name] syndrome (post-colectomy), desmoid tumors, DVT (RUE), anemia, and severe protein-calorie malnutrition. She is followed at [facility] for chronic abdominal and extremity pain related to her desmoid tumors.    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     Her clinical picture is further complicated by frequent admissions where pain control is a recurrent issue.  While she will often present with a bona fide organic insult that triggers acute flares of chronic abdominal pain, immediate escalations to treat the acute pain lead to positive feedback loops with deleterious effects.  Her clinical course often becomes complicated by narcotic bowel syndrome, visceral hyperalgesia, and generalized opioid-induced hyperalgesia that prolongs admission after the inciting, acute pathology results.    As a result, while it is very important to control her acute pain when she presents for admission, it is likewise important to avoid over-escalation of her therapy so as not to precipitate the sequelae of high dose opioid exposure.    For context, her home regimen at baseline consists of oxycodone 10 mg TID PRN and Butrans patch 20 mcg/hr. ideally, in the long-term, her home regimen would consist of no opioids in favor of multimodal medications or, at most, Butrans patch + multimodal analgesia.    I had a clinic visit scheduled with her today for virtual visit but she was unavailable for the visit given how unwell she was feeling while at the infusion clinic.  I spoke with her briefly on the phone around midday and have had a chance to look at her chart and spoke with her ED team this evening and have the following recommendations.    General admission recommendations for [name] [name]'s pain control:  -Continue home Butrans patch at 20 mcg/h  -If [name] [name] presents having been on her home regimen of oxycodone 10 mg 3 times daily as needed, then start her on LIQUID oxycodone 15 mg q4h PRN  -If she presents having been on a higher dose of oxycodone in the days/weeks leading to admission, then start her on LIQUID oxycodone 20 mg q4h PRN   -Okay to start her on IV HM 1 mg every 4 hours as needed in the ED and on admission  -Do not escalate without assessment from chronic pain team, all efforts should be made not to escalate beyond this dose  -Okay to remain at this frequency for up to 3 days with a reminder that this was the agreed-upon duration of this maximum dose  -Plan to slowly wean dose or interval per her preference daily thereafter    -If the above is insufficient or she is not making adequate progress, then please consult the chronic pain service; otherwise.  Nevertheless, the chronic pain service is always available to be consulted if her providers have any concerns or questions.    Additionally, during prolonged hospitalizations [name] [name] has had multiple interventions trialed to optimize her pain control including utilization of IV lidocaine infusions and IV ketamine infusions.  She is rarely benefited from IV lidocaine infusions, though they can be utilized as third line intervention in the future.  She does tend to benefit from IV ketamine infusions but, per [facility] pain division policy, patients can only have  4 ketamine infusions per year.  Her last ketamine infusions were on the following dates: 5/24, 6/6, 8/1.  Therefore, [name] [name] has 1 remaining ketamine infusion until 08/01/2023 and this last infusion should be reserved for the most severe of pain flares.    Nat Christen, MD  Assistant Professor of Anesthesiology  Department of Anesthesiology   Divisions of General Anesthesiology and Pain Medicine

## 2023-03-26 NOTE — Unmapped (Signed)
Medicine Care Advancement Team [facility]) Initial Consult Note      Assessment and Recommendations:   [name] [name] is a 23 y.o. female who is presenting to [facility] with Cancer associated pain, in the setting of the following pertinent/contributing co-morbidities: desmoid fibromatosis, [name], complex chronic pain, prior dependence on TPN or tube feeds, prior proctocolectomy with ileoanal anastamosis c/b pouchitis. Pt was seen at the request of [name] Pho, MD (Emergency Medicine) for advancement of care.    Principal Problem:    Cancer associated pain  Active Problems:    [name]    Pain medication agreement signed    Pouchitis (CMS-HCC)      Active Problems    Desmoid fibromatosis s/p proctocolectomy with ileoanal anastamosis  [name]  Patient of Dr. Meredith [name]. Patient has numerous sites of disease but most bothersome has been mesenteric sites which have caused chronic abdominal pain, nausea/vomiting. She presents at this time due to inadequately controlled abdominal pain and nausea, which have made it difficult for her to take in food or water. Please see Dr. Brynda [name] note from 1/14 addended 1/15 with his very thoughtful assessment of this situation and thoughts on current hospitalization.  - Continue home nirogacestat with pharmacy assistance  - Consider medical oncology consultation in AM, if no MedO space available, per Dr. Brynda [name] note  - Other concerns as below    Poor PO intake / Nausea  Presyncopal episodes at home  Malnutrition  Patient presents as she feels she needs to be back on TPN. This has been discussed with her primary outpatient providers including Dr. [name], Dr. Carmon [name], Dr. Meredith [name], and [name] [name] her NP who do not see a current role for TPN at this time. She notes poor PO intake but was able to keep down a small amount of pedialyte and crackers today. She does not usually vomit but has felt very nauseated and feels that her chronic abdominal pain is worsened by eating. She currently does not feel able to take PO medications and requests IV alternatives where possible. She is hemodynamically stable, moist mucous membranes on exam. Labs extremely reassuring with normal electrolytes, albumin 3.7.  - Consider GI consult and nutrition consult in AM as requested by outpatient oncologists to further discuss role for TPN in this scenario  - 500 mL LR over 5 hours; counseled patient and mother on risks of overdoing it with IV fluids  - Phenergan 12.5 mg IV PRN q6H as started by ED  - Continue home scopolamine patch    Acute on chronic pain  In addition to nausea as above, patient presents due to uncontrolled abdominal pain, worse in the lower quadrants. No peritonitic signs on exam. Follows with chronic pain, see treatment plan from 1/15.  - Consult chronic pain  - Continue home butrans patch, scheduled tylenol  - Hold home oral oxycodone as patient does not feel able to take this  - Per chronic pain treatment plan, start dilaudid 1 mg q4H PRN for up to three days; do not escalate frequency without contacting chronic pain    Pouchitis  Ongoing bright red blood per rectum  Patient with total abdominal colectomy with J pouch in 2022. Presented 03/08/23 to 03/20/23, admitted to [facility] with pouchits (diarrhea and hematochezia), most recent of several prior presentations. During hospitalization,  had pouchoscopy showing healing pouchitis. Started on cefdinir during hospitalization with improvement. She continues to have some BRBPR with each bowel movement. Her H/H stable on admission (Hgb 12.8) and VSS. Rectal exam  deferred as patient is anxious and tearful on my exam.  - Consider GI consult in AM as requested by outpatient oncologists to consider cycling of antibiotics  - For now continue cefdinir 300 mg BID, plan to switch to 300 mg daily      Chronic Problems    PTSD  GAD  MDD  - Continue home lexapro  - Continue home ativan PRN    GERD  - Continue protonix, famotidine    Allergies  - Continue cetirizine    Checklist:  Diet: Regular Diet  DVT PPx: Contraindicated - High Risk for Bleeding/Active Bleeding  Code Status: Full Code  Dispo: Patient appropriate for Observation based on expectation of ongoing need for hospitalization less than two midnights and/or low intensity of services provided      Target Team: DestinationTeam: Teaching Team  CCM Communication:   None Anticipated     For questions between 7:30AM-5PM, please page the [facility] resident pager at [phone]. After 5:30 PM, the Medicine Consult Service is covered by the [facility] On-Call Resident for urgent/emergent questions or concerns.         Issues Impacting Complexity of Management:  -Parenteral controlled medications: IV Dilaudid    Medical Decision Making: Reviewed records from the following unique sources  inpatient notes, outpatient specialty notes.      Subjective:   [name] [name] is a 23 y.o. female with pertinent PMHx of desmoid fibromatosis s/p total abdominal colectomy with J pouch 2022, recent admission for pouchitis, chronic pain, depression, anxiety, PTSD presenting with pain, nausea, and hematochezia.       HPI:  Patient admitted 12/29 to 1/10 for pouchitis (diarrhea, hematochezia, and pain). Her symptoms had improved by the time of discharge but she feels she was discharged too quickly. Since being home on 1/10, she has had continued abdominal pain and nausea. She also continues to have liquid blood in the toilet each time she uses the toilet. She has been able to keep some food down but is avoiding eating due to fear she may vomit. She has also been avoiding oral medications, so her chronic abdominal pain is currently flaring as she has not been able to take her usual oxycodone. She notes several episodes of standing up and seeing stars, feeling as if she may faint. This has caused her to avoid standing.    She is very concerned about weight loss (see below) and feels that she may need TPN again.  Wt Readings from Last 12 Encounters:   03/25/23 51.3 kg (113 lb)   03/24/23 52.8 kg (116 lb 6.4 oz)   03/17/23 53.5 kg (118 lb)   02/24/23 54.1 kg (119 lb 4.8 oz)   01/23/23 57.4 kg (126 lb 9.6 oz)   01/21/23 58 kg (127 lb 12.8 oz)   01/14/23 58 kg (127 lb 12.8 oz)   12/16/22 58.5 kg (129 lb)   12/16/22 58.5 kg (129 lb)   12/08/22 61.3 kg (135 lb 3.2 oz)   11/14/22 61 kg (134 lb 8 oz)   09/24/22 62.4 kg (137 lb 9.6 oz)        She discussed these issues with infusion APP this AM, who discussed with her primary onc (Dr [name] [name]), GI providers (Dr. Carmon [name], Dr. [name]) and Maryagnes Amos, her mental health provider. The outpatient team does not feel she needs to consider TPN at this point especially given her very reassuring labs. Please see Dr. Brynda [name] note from 1/14 addended 1/15 with his very thoughtful assessment of  this situation.      Allergies  Adhesive tape-silicones; Ferrlecit [sodium ferric gluconat-sucrose]; Levofloxacin; Methylnaltrexone; Neomycin; Papaya; Morphine; Zosyn [piperacillin-tazobactam]; Compazine [prochlorperazine]; Iron analogues; Reglan [metoclopramide hcl]; Iron dextran; and Latex, natural rubber    I reviewed the Medication List. The current list is Accurate  Prior to Admission medications    Medication Dose, Route, Frequency   acetaminophen (TYLENOL) 500 MG tablet 1,000 mg, Oral, Every 8 hours   buprenorphine (BUTRANS) 20 mcg/hour PTWK transdermal patch 1 patch, Transdermal, Every 7 days   cefdinir (OMNICEF) 300 MG capsule Take 1 capsule (300 mg total) by mouth every twelve (12) hours for 19 days, THEN 1 capsule (300 mg total) daily for 28 days.   COURIERED MED OR SUPPLY OGSVEO 50 MG TABLET.   escitalopram oxalate (LEXAPRO) 5 MG tablet 5 mg, Oral, Daily (standard)   famotidine (PEPCID) 20 MG tablet 20 mg, Oral, Nightly   fluticasone propionate (FLONASE) 50 mcg/actuation nasal spray Use 1 spray under the butrans patch once every 7 days to prevent allergic reaction to the adhesive   hydrocortisone 2.5 % cream Topical, 2 times a day PRN   lidocaine (ASPERCREME) 4 % patch 1 patch, Transdermal, Daily PRN   LORazepam (ATIVAN) 1 MG tablet 1 mg, Oral, 2 times a day PRN   naloxegol (MOVANTIK) 12.5 mg tablet 12.5 mg, Oral, Daily (standard)  Patient taking differently: Take 1 tablet (12.5 mg total) by mouth daily as needed.   naloxone (NARCAN) 4 mg nasal spray One spray in either nostril once for known/suspected opioid overdose. May repeat every 2-3 minutes in alternating nostril til EMS arrives   nirogacestat (OGSIVEO) 50 mg tablet 150 mg, Oral, 2 times a day (standard), Swallow tablets whole; do not break, crush, or chew.   ondansetron (ZOFRAN-ODT) 8 MG disintegrating tablet 8 mg, orally disintegrating tablet, Every 8 hours PRN   oxyCODONE (ROXICODONE) 5 mg/5 mL solution 20 mg, Oral, Every 4 hours PRN   pantoprazole (PROTONIX) 40 MG tablet 40 mg, Oral, Daily before breakfast   promethazine (PHENERGAN) 12.5 MG tablet 12.5 mg, Oral, Every 12 hours PRN   scopolamine (TRANSDERM-SCOP) 1 mg over 3 days 1 patch, Transdermal, Every 72 hours   sucralfate (CARAFATE) 100 mg/mL suspension 1 g, Oral, 4 times a day PRN   zinc oxide-cod liver oil (DESITIN 40%) 40 % Pste Topical, Daily PRN       Designated Healthcare Decision Maker:  Ms. Bennis currently has decisional capacity for healthcare decision-making and is able to designate a surrogate healthcare decision maker. Ms. Virella designated healthcare decision maker(s) is/are Yemariam Magar (the patient's parent) as denoted by stated patient preference.    Objective:   Physical Exam:  Temp:  [36.8 ??C (98.2 ??F)] 36.8 ??C (98.2 ??F)  Heart Rate:  [73-115] 73  SpO2 Pulse:  [72-93] 72  Resp:  [10-16] 12  BP: (113-151)/(68-89) 113/68  SpO2:  [95 %-98 %] 96 %    Gen: No acute distress  HEENT: Oropharynx clear, MMM  Lungs -  Normal work of breathing. Lungs clear to auscultation.  Heart - No murmurs, rubs, or gallops.  Regular rate and rhythm.  Abdomen - BS normal. Severely tender to palpation diffusely, worse LLQ and RLQ. No rigidity or rebound tenderness.  Extremities -  Extremities warm and well perfused, no peripheral edema  Peripheral pulses - normal, capilliary refill <2secs  Psych: Affect is tearful  Neuro: Moves all extremities equally, no facial droop, alert and oriented, speech is fluent and appropriate,  Labs/Studies:  Labs and Studies from the last 24hrs per EMR and Reviewed    Imaging: Radiology studies were personally reviewed

## 2023-03-26 NOTE — Unmapped (Signed)
[facility]  Emergency Department Provider Note     ED Clinical Impression     Final diagnoses:   Generalized weakness (Primary)   Desmoid fibromatosis   Hematochezia      HPI, Medical Decision Making, ED Course     Initial Clinical Impression:    March 25, 2023 5:43 PM   [name] [name] is a 23 y.o. 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 The patient states she has had 5 days of BRBPR, in the setting of history of recurrent GI bleeds. She endorses generalized body pain and nausea for a few weeks. She states she has had decreased PO intake due to this. Per mother at bedside, the patient has had 20 pounds of unintentional weight loss over the past few months. The patient states she has had multiple syncopal episodes (without head strike) and orthostatic hypotension episodes. She usually takes 20 mg of Oyxcodone daily, but reports she is unable to keep down this medication due to her nausea. She had an iron infusion yesterday. Denies fevers, rhinorrhea, cough, or congestion.     Per chart review, the patient was evaluated by her Oncologist yesterday regarding her symptoms. At the time, the provider did not see an indication for hospitalization, and informed the patient to present to the ED if she did not feel comfortable going home.     BP 109/59  - Pulse 69  - Temp 37.3 ??C (99.1 ??F) (Oral)  - Resp 14  - Wt 51.3 kg (113 lb)  - LMP 12/09/2022 (Approximate)  - SpO2 97%  - BMI 18.80 kg/m??     Patient appears uncomfortable but is in no acute distress.  Chronically ill and fatigued appearing.  Heart sounds regular.  Breath sounds clear.  Abdomen soft and nontender.  Normal neurologic exam.    Medical Decision Making    Patient presents to the emergency department for admission by oncology due to poor p.o. intake, significant weight loss, and possible initiation of TPN.  She additionally concerns of ongoing hematochezia in the setting of known pouchitis.  Given poor p.o. intake, I am concerned for possible electrolyte derangement, dehydration, versus other abnormality.  Additionally concern for anemia given ongoing hematochezia.  No syncope or head strike, do not believe she requires CT imaging.  No fevers or infectious signs or symptoms, thus will hold off on broad-spectrum antibiotics.  Plan for basic labs, electrolytes, VBG, coags, type and screen, blood culture, UA, and chest x-ray.  Will give Dilaudid for pain and Phenergan for nausea per patient request.  Will additionally order home medications and page oncology for admission.    Further ED updates and updates to plan as per ED Course below:    ED Course:  ED Course as of 03/26/23 1922   Wed Mar 25, 2023   1759 XR Chest 2 views  IMPRESSION:  No radiographic evidence of acute cardiopulmonary pathology.   1804 VBG unremarkable.   1804 CBC within normal limits, no leukocytosis or anemia.   1821 CMP nonactionable.   1839 Phosphorus: 3.5   1839 Magnesium: 2.2   2038 I spoke with patient's pain management physician Dr. Darlis Loan who will provide a treatment plan regarding patient's pain management.  She does have a very long and complex history with difficulty  balancing patient's pain management and narcotic bowel syndrome.  He does agree with 1 mg Dilaudid IV every 4 hours as needed for significant pain which I have ordered.       Discussion of Management with other Physicians, QHP or Appropriate Source: Consultant - [name]  Independent Interpretation of Studies: If applicable, documented in ED course above.  I have reviewed recent and relevant previous record, including: Outpatient notes - 03/23/2022 [facility] Oncology Office Visit Note - for review of patient's recent Oncology visit yesterday  Escalation of Care including OBS/Admission/Transfer was considered: Admission  Social Determinants that significantly affected care: None  Prescription drugs considered but not prescribed: N/A  Diagnostic tests considered but not performed: N/A  ____________________________________________    The case was discussed with the attending physician, who is in agreement with the above assessment and plan.     Additional History Elements     Chief Complaint  Chief Complaint   Patient presents with    Medical Problem     Additional Historian(s): the mother    Past Medical History:   Diagnosis Date    Abdominal pain     Acid reflux     occas    Anesthesia complication     itching, shaking, coldness; last few surgeries have gone much better    Cancer (CMS-HCC)     Cataract of right eye     COVID-19 virus infection 01/2019    Cyst of thyroid determined by ultrasound     monitoring    [name]     2 right forearm, 1 left thigh, 1 right scapula, 1 under left clavicle; multiple    Difficult intravenous access     [name] (familial adenomatous polyposis)     [name]     Gastric polyps     History of chemotherapy     last treatment approx 05/2019    History of colon polyps     History of COVID-19 01/2019    Ileus (CMS-HCC) 03/16/2022    Iron deficiency anemia due to chronic blood loss     received iron infusion 11-2019    PONV (postoperative nausea and vomiting)     Rectal bleeding     Syncopal episodes     especially if becoming dehydrated     Past Surgical History:   Procedure Laterality Date    COLON SURGERY      cyroablation      cystis removal      desmoid removal      PR CLOSE ENTEROSTOMY,RESEC+ANAST N/A 10/09/2020    Procedure: ILEOSTOMY TAKEDOWN;  Surgeon: Mickle Asper, MD;  Location: OR [place];  Service: General Surgery    PR COLONOSCOPY W/BIOPSY SINGLE/MULTIPLE N/A 10/27/2012    Procedure: COLONOSCOPY, FLEXIBLE, PROXIMAL TO SPLENIC FLEXURE; WITH BIOPSY, SINGLE OR MULTIPLE;  Surgeon: Shirlyn Goltz Mir, MD;  Location: PEDS PROCEDURE ROOM [place];  Service: Gastroenterology    PR COLONOSCOPY W/BIOPSY SINGLE/MULTIPLE N/A 09/14/2013    Procedure: COLONOSCOPY, FLEXIBLE, PROXIMAL TO SPLENIC FLEXURE; WITH BIOPSY, SINGLE OR MULTIPLE;  Surgeon: Shirlyn Goltz Mir, MD;  Location: PEDS PROCEDURE ROOM [place];  Service: Gastroenterology    PR COLONOSCOPY W/BIOPSY SINGLE/MULTIPLE N/A 11/08/2014    Procedure: COLONOSCOPY, FLEXIBLE, PROXIMAL TO SPLENIC FLEXURE; WITH BIOPSY, SINGLE OR MULTIPLE;  Surgeon: Arnold Long Mir, MD;  Location: PEDS PROCEDURE ROOM [facility];  Service: Gastroenterology    PR COLONOSCOPY W/BIOPSY SINGLE/MULTIPLE N/A 12/26/2015    Procedure: COLONOSCOPY, FLEXIBLE, PROXIMAL TO SPLENIC FLEXURE; WITH BIOPSY, SINGLE OR MULTIPLE;  Surgeon: Arnold Long Mir, MD;  Location: PEDS PROCEDURE ROOM [facility];  Service: Gastroenterology    PR COLONOSCOPY W/BIOPSY SINGLE/MULTIPLE N/A 09/02/2017    Procedure: COLONOSCOPY, FLEXIBLE, PROXIMAL TO SPLENIC FLEXURE; WITH BIOPSY, SINGLE OR MULTIPLE;  Surgeon: Arnold Long Mir, MD;  Location: PEDS PROCEDURE ROOM [facility];  Service: Gastroenterology    PR COLSC FLX W/REMOVAL LESION BY HOT BX FORCEPS N/A 08/27/2016    Procedure: COLONOSCOPY, FLEXIBLE, PROXIMAL TO SPLENIC FLEXURE; W/REMOVAL TUMOR/POLYP/OTHER LESION, HOT BX FORCEP/CAUTE;  Surgeon: Arnold Long Mir, MD;  Location: PEDS PROCEDURE RO[facility];  Service: Gastroenterology    PR COLSC FLX W/RMVL OF TUMOR POLYP LESION SNARE TQ N/A 02/25/2019    Procedure: COLONOSCOPY FLEX; W/REMOV TUMOR/LES BY SNARE;  Surgeon: Helyn Numbers, MD;  Location: [facility] PROCEDURES [place] [facility];  Service: Gastroenterology    PR COLSC FLX W/RMVL OF TUMOR POLYP LESION SNARE TQ N/A 03/13/2020    Procedure: COLONOSCOPY FLEX; W/REMOV TUMOR/LES BY SNARE;  Surgeon: Helyn Numbers, MD;  Location: [facility] [place] [facility];  Service: Gastroenterology    PR EXC SKIN BENIG 2.1-3 CM TRUNK,ARM,LEG Right 02/25/2017    Procedure: EXCISION, BENIGN LESION INCLUDE MARGINS, EXCEPT SKIN TAG, LEGS; EXCISED DIAMETER 2.1 TO 3.0 CM;  Surgeon: Clarene Duke, MD;  Location: CHILDRENS OR [facility];  Service: Plastics    PR EXC SKIN BENIG 3.1-4 CM TRUNK,ARM,LEG Right 02/25/2017    Procedure: EXCISION, BENIGN LESION INCLUDE MARGINS, EXCEPT SKIN TAG, ARMS; EXCISED DIAMETER 3.1 TO 4.0 CM;  Surgeon: Clarene Duke, MD;  Location: CHILDRENS OR [facility];  Service: Plastics    PR EXC SKIN BENIG >4 CM FACE,FACIAL Right 02/25/2017    Procedure: EXCISION, OTHER BENIGN LES INCLUD MARGIN, FACE/EARS/EYELIDS/NOSE/LIPS/MUCOUS MEMBRANE; EXCISED DIAM >4.0 CM;  Surgeon: Clarene Duke, MD;  Location: CHILDRENS OR [facility];  Service: Plastics    PR EXC TUMOR SOFT TISSUE LEG/ANKLE SUBQ 3+CM Right 08/05/2019    Procedure: EXCISION, TUMOR, SOFT TISSUE OF LEG OR ANKLE AREA, SUBCUTANEOUS; 3 CM OR GREATER;  Surgeon: Arsenio Katz, MD;  Location: MAIN OR [place];  Service: Plastics    PR EXC TUMOR SOFT TISSUE LEG/ANKLE SUBQ <3CM Right 08/05/2019    Procedure: EXCISION, TUMOR, SOFT TISSUE OF LEG OR ANKLE AREA, SUBCUTANEOUS; LESS THAN 3 CM;  Surgeon: Arsenio Katz, MD;  Location: MAIN OR [facility];  Service: Plastics    PR LAP, SURG PROCTECTOMY W J-POUCH N/A 08/10/2020    Procedure: ROBOTIC ASSISTED LAPAROSCOPIC PROCTOCOLECTOMY, ILEAL J POUCH, WITH OSTOMY;  Surgeon: Mickle Asper, MD;  Location: OR [place];  Service: General Surgery    PR NDSC EVAL INTSTINAL POUCH DX W/COLLJ SPEC SPX N/A 01/23/2021    Procedure: ENDO EVAL SM INTEST POUCH; DX;  Surgeon: Modena Nunnery, MD;  Location: [facility]ED[facility] [place] [facility];  Service: Gastroenterology    PR NDSC EVAL INTSTINAL POUCH DX W/COLLJ SPEC SPX N/A 08/27/2021    Procedure: ENDO EVAL SM INTEST POUCH; DX;  Surgeon: Hunt Oris, MD;  [facility]: [facility];  Service: Gastroenterology    PR NDSC EVAL INTSTINAL POUCH DX W/COLLJ SPEC SPX N/A 12/09/2021    Procedure: ENDO EVAL SM INTEST POUCH; DX;  Surgeon: Vidal Schwalbe, MD;  Location: GI PROCEDURES [facility];  Service: Gastroenterology    PR NDSC EVAL INTSTINAL POUCH DX W/COLLJ [facility] SPX Left 04/09/2022    Procedure: ENDO EVAL SM INTEST POUCH; DX;  Surgeon: Modena Nunnery, MD;  Location: [facility] [place] [facility];  Service: Gastroenterology    PR NDSC EVAL  INTSTINAL POUCH DX W/COLLJ SPEC SPX N/A 08/05/2022    Procedure: ENDO EVAL SM INTEST POUCH; DX;  Surgeon: Modena Nunnery, MD;  Location: GI PROCEDURES [facility];  Service: Gastroenterology    PR NDSC EVAL INTSTINAL POUCH DX W/COLLJ SPEC SPX N/A 03/13/2023    Procedure: ENDO EVAL SM INTEST POUCH; DX;  Surgeon: Carmon Ginsberg, MD;  Location: [facility] PROCEDURES [facility];  Service: Gastroenterology    PR NDSC EVAL INTSTINAL POUCH W/BX SINGLE/MULTIPLE N/A 01/20/2022    Procedure: ENDOSCOPIC EVAL OF SMALL INTESTINAL POUCH; DIAGNOSTIC, No biopsies;  Surgeon: Andrey Farmer, MD;  Location: GI PROCEDURES [facility];  Service: Gastroenterology    PR NDSC EVAL INTSTINAL POUCH W/BX SINGLE/MULTIPLE N/A 02/13/2022    Procedure: ENDOSCOPIC EVAL OF SMALL INTESTINAL POUCH; DIAGNOSTIC, WITH BIOPSY;  Surgeon: Bronson Curb, MD;  Location: GI PROCEDURES [facility];  Service: Gastroenterology    PR NDSC EVAL INTSTINAL POUCH W/BX SINGLE/MULTIPLE N/A 03/13/2023    Procedure: ENDOSCOPIC EVAL OF SMALL INTESTINAL POUCH; DIAGNOSTIC, WITH BIOPSY;  Surgeon: Carmon Ginsberg, MD;  Location: GI PROCEDURES [facility];  Service: Gastroenterology    PR UNLISTED PROCEDURE SMALL INTESTINE  01/23/2021    Procedure: UNLISTED PROCEDURE, SMALL INTESTINE;  Surgeon: [place] Nunnery, MD;  Location: [facility] PROC[facility] [place] [facility];  Service: Gastroenterology    PR UNLISTED PROCEDURE SMALL INTESTINE  02/13/2022    Procedure: UNLISTED PROCEDURE, SMALL INTESTINE;  Surgeon: Bronson Curb, MD;  Location: GI PROCEDURES [facility];  Service: Gastroenterology    PR UPPER GI ENDOSCOPY,BIOPSY N/A 10/27/2012    Procedure: UGI ENDOSCOPY; WITH BIOPSY, SINGLE OR MULTIPLE;  Surgeon: Shirlyn Goltz Mir, MD;  Location: PEDS PROCEDURE ROOM [facility];  Service: Gastroenterology    PR UPPER GI ENDOSCOPY,BIOPSY N/A 09/14/2013    Procedure: UGI ENDOSCOPY; WITH BIOPSY, SINGLE OR MULTIPLE;  Surgeon: Shirlyn Goltz Mir, MD;  Location: PEDS PROCEDURE ROOM [facility];  Service: Gastroenterology    PR UPPER GI ENDOSCOPY,BIOPSY N/A 11/08/2014    Procedure: UGI ENDOSCOPY; WITH BIOPSY, SINGLE OR MULTIPLE;  Surgeon: Arnold Long Mir, MD;  Location: PEDS PROCEDURE ROOM [facility];  Service: Gastroenterology    PR UPPER GI ENDOSCOPY,BIOPSY N/A 12/26/2015    Procedure: UGI ENDOSCOPY; WITH BIOPSY, SINGLE OR MULTIPLE;  Surgeon: Arnold Long Mir, MD;  Location: PEDS PROCEDURE ROOM [facility];  Service: Gastroenterology    PR UPPER GI ENDOSCOPY,BIOPSY N/A 08/27/2016    Procedure: UGI ENDOSCOPY; WITH BIOPSY, SINGLE OR MULTIPLE;  Surgeon: Arnold Long Mir, MD;  Location: PEDS PROCEDURE ROOM [facility];  Service: Gastroenterology    PR UPPER GI ENDOSCOPY,BIOPSY N/A 09/02/2017    Procedure: UGI ENDOSCOPY; WITH BIOPSY, SINGLE OR MULTIPLE;  Surgeon: Arnold Long Mir, MD;  Location: PEDS PROCEDURE ROOM [facility];  Service: Gastroenterology    PR UPPER GI ENDOSCOPY,BIOPSY N/A 03/13/2020    Procedure: UGI ENDOSCOPY; WITH BIOPSY, SINGLE OR MULTIPLE;  Surgeon: Helyn Numbers, MD;  Location: [facility] PROCEDURES [place] [facility];  Service: Gastroenterology    PR UPPER GI ENDOSCOPY,BIOPSY N/A 09/05/2021    Procedure: UGI ENDOSCOPY; WITH BIOPSY, SINGLE OR MULTIPLE;  Surgeon: Wendall Papa, MD;  Location: GI PROCEDURES [facility];  Service: Gastroenterology    PR UPPER GI ENDOSCOPY,DIAGNOSIS N/A 01/20/2022    Procedure: UGI ENDO, INCLUDE ESOPHAGUS, STOMACH, & DUODENUM &/OR JEJUNUM; DX W/WO COLLECTION SPECIMN, BY BRUSH OR WASH;  Surgeon: Andrey Farmer, MD;  Location: GI PROCEDURES [facility];  Service: Gastroenterology    TUMOR REMOVAL      multiple-head, neck, back, hand, right flank, multiple Allergies  Adhesive tape-silicones; Ferrlecit [sodium ferric gluconat-sucrose]; Levofloxacin; Methylnaltrexone; Neomycin; Papaya; Morphine; Zosyn [piperacillin-tazobactam]; Compazine [prochlorperazine]; Iron analogues; Reglan [metoclopramide hcl]; Iron dextran; and Latex, natural rubber    Family History  Family History   Problem Relation Age of Onset    No Known Problems Mother     No Known Problems Father     No Known Problems Sister     No Known Problems Brother     Stroke Maternal Grandmother     Other Maternal Grandmother         benign lesions of liver and pancreas, further details unknown    Cancer Maternal Grandmother     Diabetes Maternal Grandmother     Hypertension Maternal Grandmother     Thyroid disease Maternal Grandmother     Arthritis Maternal Grandfather     Asthma Maternal Grandfather     COPD Paternal Grandmother         Deceased    Miscarriages / Stillbirths Paternal Grandmother     Alcohol abuse Paternal Grandfather         Deceased    No Known Problems Maternal Aunt     No Known Problems Maternal Uncle     No Known Problems Paternal Aunt     No Known Problems Paternal Uncle     Anesthesia problems Neg Hx     Broken bones Neg Hx     Cancer Neg Hx     Clotting disorder Neg Hx     Collagen disease Neg Hx     Diabetes Neg Hx     Dislocations Neg Hx     Fibromyalgia Neg Hx     Gout Neg Hx     Hemophilia Neg Hx     Osteoporosis Neg Hx     Rheumatologic disease Neg Hx     Scoliosis Neg Hx     Severe sprains Neg Hx     Sickle cell anemia Neg Hx     Spinal Compression Fracture Neg Hx     Melanoma Neg Hx     Basal cell carcinoma Neg Hx     Squamous cell carcinoma Neg Hx      Social History  Social History     Tobacco Use    Smoking status: Never     Passive exposure: Past    Smokeless tobacco: Never   Vaping Use    Vaping status: Never Used   Substance Use Topics    Alcohol use: Never    Drug use: Never      Physical Exam     VITAL SIGNS:      Vitals:    03/26/23 1101 03/26/23 1226 03/26/23 1536 03/26/23 1811   BP: 117/75  136/81 109/59   Pulse: 70  78 69   Resp: 10  12 14    Temp:  36.6 ??C (97.9 ??F) 37.3 ??C (99.1 ??F)    TempSrc:  Oral Oral    SpO2: 98%  97% 97%   Weight:         Constitutional: Alert and oriented. No acute distress.  Uncomfortable appearing.  Eyes: Conjunctivae are normal.  HEENT: Normocephalic and atraumatic. Conjunctivae clear. No congestion. Moist mucous membranes.   Cardiovascular: Rate as above, regular rhythm. Normal and symmetric distal pulses. Brisk capillary refill. Normal skin turgor.  Respiratory: Normal respiratory effort. Breath sounds are normal. There are no wheezing or crackles heard.  Gastrointestinal: Soft, non-distended, non-tender.  Genitourinary: Deferred.  Musculoskeletal: Non-tender with normal range of motion in all  extremities.  Neurologic: Normal speech and language. No gross focal neurologic deficits are appreciated. Patient is moving all extremities equally, face is symmetric at rest and with speech.  Skin: Skin is warm, dry and intact. No rash noted.  Psychiatric: Mood and affect are normal. Speech and behavior are normal.     Radiology     XR Chest 2 views   Final Result   No radiographic evidence of acute cardiopulmonary pathology.           Pertinent labs & imaging results that were available during my care of the patient were independently interpreted by me and considered in my medical decision making (see chart for details).    Portions of this record have been created using Scientist, clinical (histocompatibility and immunogenetics). Dictation errors have been sought, but may not have been identified and corrected.    Documentation assistance was provided by Aris Lot, Scribe on March 25, 2023 at 6:04 PM for Suzzanne Cloud, MD.     Documentation assistance was provided by the scribe in my presence.  The documentation recorded by the scribe has been reviewed by me and accurately reflects the services I personally performed.       Suzzanne Cloud, MD  Resident  03/26/23 [phone]

## 2023-03-27 LAB — COMPREHENSIVE METABOLIC PANEL
ALBUMIN: 3.5 g/dL (ref 3.4–5.0)
ALKALINE PHOSPHATASE: 61 U/L (ref 46–116)
ALT (SGPT): 18 U/L (ref 10–49)
ANION GAP: 11 mmol/L (ref 5–14)
AST (SGOT): 24 U/L (ref <=34)
BILIRUBIN TOTAL: 0.8 mg/dL (ref 0.3–1.2)
BLOOD UREA NITROGEN: 6 mg/dL — ABNORMAL LOW (ref 9–23)
BUN / CREAT RATIO: 10
CALCIUM: 9.4 mg/dL (ref 8.7–10.4)
CHLORIDE: 105 mmol/L (ref 98–107)
CO2: 26 mmol/L (ref 20.0–31.0)
CREATININE: 0.62 mg/dL (ref 0.55–1.02)
EGFR CKD-EPI (2021) FEMALE: >90 mL/min/{1.73_m2} (ref >=60)
GLUCOSE RANDOM: 92 mg/dL (ref 70–179)
POTASSIUM: 3.5 mmol/L (ref 3.4–4.8)
PROTEIN TOTAL: 6.4 g/dL (ref 5.7–8.2)
SODIUM: 142 mmol/L (ref 135–145)

## 2023-03-27 LAB — CBC
HEMATOCRIT: 37 % (ref 34.0–44.0)
HEMOGLOBIN: 12.3 g/dL (ref 11.3–14.9)
MEAN CORPUSCULAR HEMOGLOBIN CONC: 33.2 g/dL (ref 32.0–36.0)
MEAN CORPUSCULAR HEMOGLOBIN: 26.9 pg (ref 25.9–32.4)
MEAN CORPUSCULAR VOLUME: 81 fL (ref 77.6–95.7)
MEAN PLATELET VOLUME: 9.8 fL (ref 6.8–10.7)
PLATELET COUNT: 200 10*9/L (ref 150–450)
RED BLOOD CELL COUNT: 4.57 10*12/L (ref 3.95–5.13)
RED CELL DISTRIBUTION WIDTH: 16.2 % — ABNORMAL HIGH (ref 12.2–15.2)
WBC ADJUSTED: 12.9 10*9/L — ABNORMAL HIGH (ref 3.6–11.2)

## 2023-03-27 MED ADMIN — escitalopram oxalate (LEXAPRO) tablet 5 mg: 5 mg | ORAL | @ 14:00:00

## 2023-03-27 MED ADMIN — oxyCODONE (ROXICODONE) 5 mg/5 mL solution 20 mg: 20 mg | ORAL | @ 02:00:00 | Stop: 2023-04-09

## 2023-03-27 MED ADMIN — promethazine (PHENERGAN) 12.5 mg in sodium chloride (NS) 0.9 % 50 mL IVPB: 12.5 mg | INTRAVENOUS | @ 01:00:00

## 2023-03-27 MED ADMIN — ertapenem (INVANZ) 1 g in sodium chloride 0.9 % (NS) 100 mL IVPB-MBP: 1 g | INTRAVENOUS | Stop: 2023-04-24

## 2023-03-27 MED ADMIN — oxyCODONE (ROXICODONE) 5 mg/5 mL solution 20 mg: 20 mg | ORAL | @ 22:00:00 | Stop: 2023-04-09

## 2023-03-27 MED ADMIN — diphenhydrAMINE (BENADRYL) injection: 25 mg | INTRAVENOUS | @ 04:00:00 | Stop: 2023-03-26

## 2023-03-27 MED ADMIN — lidocaine (ASPERCREME) 4 % 1 patch: 1 | TRANSDERMAL | @ 14:00:00

## 2023-03-27 MED ADMIN — vancomycin (VANCOCIN) 750 mg in dextrose 5 % 150 mL IVPB (premix): 750 mg | INTRAVENOUS | @ 15:00:00 | Stop: 2023-03-27

## 2023-03-27 MED ADMIN — promethazine (PHENERGAN) 12.5 mg in sodium chloride (NS) 0.9 % 50 mL IVPB: 12.5 mg | INTRAVENOUS | @ 15:00:00

## 2023-03-27 MED ADMIN — ondansetron (ZOFRAN) injection 8 mg: 8 mg | INTRAVENOUS | @ 11:00:00

## 2023-03-27 MED ADMIN — vancomycin (VANCOCIN) IVPB 1000 mg (premix): 1000 mg | INTRAVENOUS | @ 02:00:00 | Stop: 2023-03-26

## 2023-03-27 MED ADMIN — oxyCODONE (ROXICODONE) 5 mg/5 mL solution 20 mg: 20 mg | ORAL | @ 08:00:00 | Stop: 2023-04-09

## 2023-03-27 MED ADMIN — promethazine (PHENERGAN) 12.5 mg in sodium chloride (NS) 0.9 % 50 mL IVPB: 12.5 mg | INTRAVENOUS | @ 09:00:00

## 2023-03-27 MED ADMIN — HYDROmorphone (PF) (DILAUDID) injection 1 mg: 1 mg | INTRAVENOUS | @ 04:00:00 | Stop: 2023-03-26

## 2023-03-27 MED ADMIN — promethazine (PHENERGAN) 12.5 mg in sodium chloride (NS) 0.9 % 50 mL IVPB: 12.5 mg | INTRAVENOUS | @ 23:00:00

## 2023-03-27 MED ADMIN — ondansetron (ZOFRAN) injection 8 mg: 8 mg | INTRAVENOUS | @ 03:00:00

## 2023-03-27 MED ADMIN — ondansetron (ZOFRAN) injection 8 mg: 8 mg | INTRAVENOUS | @ 19:00:00

## 2023-03-27 MED ADMIN — HYDROmorphone (PF) (DILAUDID) injection 1 mg: 1 mg | INTRAVENOUS | @ 11:00:00 | Stop: 2023-03-31

## 2023-03-27 MED ADMIN — cefTRIAXone (ROCEPHIN) 2 g in sodium chloride 0.9 % (NS) 100 mL IVPB-MBP: 2 g | INTRAVENOUS | @ 20:00:00 | Stop: 2023-03-27

## 2023-03-27 MED ADMIN — diphenhydrAMINE (BENADRYL) injection: 50 mg | INTRAVENOUS | @ 20:00:00 | Stop: 2023-03-27

## 2023-03-27 MED ADMIN — diphenhydrAMINE (BENADRYL) injection: 25 mg | INTRAVENOUS | @ 06:00:00 | Stop: 2023-03-27

## 2023-03-27 MED ADMIN — diphenhydrAMINE (BENADRYL) injection: 50 mg | INTRAVENOUS | @ 17:00:00 | Stop: 2023-03-27

## 2023-03-27 MED ADMIN — HYDROmorphone (PF) (DILAUDID) injection 1 mg: 1 mg | INTRAVENOUS | @ 15:00:00 | Stop: 2023-03-31

## 2023-03-27 MED ADMIN — HYDROmorphone (PF) (DILAUDID) injection 1 mg: 1 mg | INTRAVENOUS | @ 20:00:00 | Stop: 2023-03-31

## 2023-03-27 NOTE — Unmapped (Signed)
Division of Infectious Diseases  General Inpatient Consultation Service       For questions about this consult, page [phone] (Gen A Follow-up Pager).      [name] [name] is being seen in consultation at the request of Arman Filter, MD for evaluation and management of strep mitis bacteremia.       PLAN FOR 03/27/2023      It is hard to dismiss strep mitis as a contaminant here given compromised tissue planes with [name], desmoid tumors, and recent pouchitis.  Per [facility] antibiogram, around 20% of strep mitis isolates are resistant to ceftriaxone and 60% are resistant to penicillin G.  100% of isolates are vancomycin susceptible.  We will have micro lab add on susceptibilities to this isolate.  She had a hospitalization a few months ago with polymicrobial bacteremia with Candida and Enterobacteriaceae, which could both be result of GI translocation.  She has also been on cefdinir for a few weeks, so she will be at increased risk for development of ESBL species.  She has no port or central access.  Patient had vancomycin infusion syndrome.    Diagnostic  Follow-up blood culture 1/15, 1/17  Follow-up strep mitis AST  Monitor for antimicrobial toxicity with the following:  CBC w/diff at least once per week  BMP at least once per week  clinical assessments for rashes or other skin changes  vancomycin trough levels (goal 10-15; at least weekly once stable)    Treatment  Continue vancomycin (for strep mitis) dosing per pharmacy  Recommend extended vancomycin infusion and benadryl pre-medication for vancomycin infusion syndrome  Start ertapenem 1 g daily (in case this is polymicrobial bacteremia, if blood cultures remain negative we can discontinue)  Duration of therapy = likely 14 days for strep mitis bacteremia  start date = TBD  end date = TBD    I discussed the plans for today with primary team on 03/27/2023.    Our service will continue to follow.    I personally spent 60 mins (1h) face-to-face and non-face-to-face in the care of this patient on 03/27/2023, which includes all pre, intra, and post visit time on the date of service.  All documented time was specific to the E/M visit and does not include any procedures that may have been performed.    Care for a suspected or confirmed infection was provided by an ID specialist in this encounter. [phone])    [name], MD  [facility] Division of Infectious Diseases               MDM and Problem-Specific Assessments  ( .00ID2DAY  /  .91YNWGNFAOZH  /  .IDSS  / .[name] )     23 y.o. 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    Patient has: []  acute illness w/systemic sxs  [mod] [x]  illness posing risk to life or function  [high]   I reviewed:   (3+) [x]  primary team note [x]  consultant note(s) []  procedure/op note(s) [x]  micro result(s)    [x]  CBC results [x]  chemistry results [x]  radiology report(s) [x]  w/indep. historian   I independently visualized:   (any)   []  cxs/plates in lab []  plain film images []  CT images []  PET images    []  path slide(s) []  ECG tracing []  MRI images []  nuclear scan  I discussed: (any) []  micro and/or path w/lab personnel []  drug options and/or interactions w/ID pharmD    []  procedure/OR findings w/other MD(s) []  echo and/or imaging w/other MD(s)    [x]  mgm't w/attending(s) involved in case []  setting up home abx w/OPAT team   Mgm't requires: []  prescription drug(s)  [mod] [x]  intensive toxicity monitoring  [high]       # Strep mitis bacteremia  - acute, poses threat to life or bodily function  [high]  Recommendations per blue box      # Pouchitis  - acute, undx'd new problem w/uncertain prognosis  [mod]  Recommendations per blue box    # Management of prescription antimicrobials needing intensive toxicity monitoring - acute, poses threat to life or bodily function  [high]  Beta-lactams (penicillins, cephalosporins, and carbapenems) can cause rashes, loose stools, nephrotoxicity, and/or myelosuppression.  Vancomycin can cause back pain, various dermatological effects, thrombocytopenia, neutropenia, and/or nephrotoxicity.   See recommendations in blue box above.      # Disposition  TBD             Antimicrobials & Other Medications  ( .00IDGANTT  /  .00IDGANTTLIST  )     Current  Ceftriaxone 1/17-  Vancomycin 1/16-    Previous  Cefdinir    Immunomodulators and antipyretics  None       Current Medications as of 03/27/2023  Scheduled  PRN   acetaminophen, 1,000 mg, Q8H  buprenorphine, 1 patch, Weekly  cefTRIAXone, 2 g, Q24H SCH  cetirizine, 20 mg, BID  cholecalciferol (vitamin D3 25 mcg (1,000 units)), 25 mcg, Daily  escitalopram oxalate, 5 mg, Daily  famotidine, 20 mg, Nightly  fluticasone propionate, 1 spray, Weekly  lidocaine, 1 patch, Daily  nirogacestat, 150 mg, BID  ondansetron, 8 mg, Q8H  pantoprazole, 40 mg, Daily before breakfast  scopolamine, 1 patch, Q72H      hydrocortisone, , BID PRN  HYDROmorphone, 1 mg, Q4H PRN  IP okay to treat, , Continuous PRN  IP okay to treat, , Continuous PRN  IP okay to treat, , Continuous PRN  LORazepam, 1 mg, BID PRN  melatonin, 3 mg, Nightly PRN  naloxegol, 12.5 mg, Daily PRN  oxyCODONE, 20 mg, Q4H PRN  promethazine, 12.5 mg, Q6H PRN           Physical Exam     Temp:  [36.4 ??C (97.6 ??F)-37.3 ??C (99.1 ??F)] 37.1 ??C (98.7 ??F)  Heart Rate:  [69-107] 102  SpO2 Pulse:  [70-102] 102  Resp:  [10-27] 22  BP: (96-136)/(55-81) 111/81  MAP (mmHg):  [67-92] 89  SpO2:  [96 %-100 %] 98 %    Actual body weight: 51.3 kg (113 lb)  Ideal body weight: 57 kg (125 lb 10.6 oz)      Const [x]  vital signs above      []  WDWN, NAD, non-toxic appearance  []        Eyes   []  Lids normal bilaterally, conjunctiva anicteric and noninjected OU  []  PERRL   []        ENMT     []  Normal appearance of external nose and ears []  OP clear    []  MMM, no lesions on lips or gums, dentition good        []  Hearing normal   []        Neck    []  Neck of normal appearance and trachea midline        []  No thyromegaly, nodules, or tenderness   []   Lymph    []  No LAD in neck       []  No LAD in supraclavicular area       []  No LAD in axillae   []  No LAD in epitrochlear chains       []  No LAD in inguinal areas  []        CV    [x]  RRR, no m/r/g, S1/S2       []  No peripheral edema, WWP       [x]  Pedal pulses intact   []        Resp   [x]  Normal WOB       [x]  CTAB   []        GI   []  Normal inspection, NTND, NABS       []  No umbilical hernia on exam       []  No hepatosplenomegaly       []  Inspection of perineal and perianal areas normal  []  Mild ttp diffusely with no rebound or guarding      GU   []  Normal external genitalia       []        MSK   []  No clubbing or cyanosis of hands       []  No focal tenderness or abnormalities on palpation of joints in RUE, LUE, RLE, or LLE  []        Skin   [x]  No rashes, lesions, or ulcers of visualized skin       [x]  Skin warm and dry to palpation   []        Neuro   [x]  CNs II-XII grossly intact       []  Sensation to light touch grossly intact throughout   []  DTRs normal and symmetric throughout   []  Unable to assess due to critical illness, sedation, or mental status  []        Psych   [x]  Appropriate affect      [x]  Oriented to person, place, time      []  Judgment and insight are appropriate   []  Unable to assess due to critical illness, sedation, or mental status  []           Patient Lines/Drains/Airways Status       Active Active Lines, Drains, & Airways       Name Placement date Placement time Site Days    Peripheral IV 03/25/23 Anterior;Right Forearm 03/25/23  1002  Forearm  2    Peripheral IV 03/26/23 Anterior;Right;Upper Arm 03/26/23  2205  Arm  less than 1                      Data for ID Decision Making  ( IDGENCONMDM )       Micro & Serological Data   ( RSLTMICRO  /  [id number]  /  00CXSRC  /  00CXRES  / 00CXSUSC )    Microbiology Results (last day)       Procedure Component Value Date/Time Date/Time    Blood Culture #2 [[id number]] Collected: 03/27/23 0945    Lab Status: In process Specimen: Blood from 1 Peripheral Draw Updated: 03/27/23 1142    Blood Culture #1 [[id number]] Collected: 03/27/23 0945    Lab Status: In process Specimen: Blood from 1 Peripheral Draw Updated: 03/27/23 1141    Blood Culture [[id number]]  (Abnormal) Collected: 03/25/23 1717    Lab Status: Preliminary result Specimen: Blood from 1 Peripheral Draw Updated: 03/27/23 1130  Blood Culture, Routine Streptococcus mitis group     Comment: Member of the Streptococcus viridans Group        Gram Stain Result Gram positive cocci    Blood Culture [[id number]]  (Normal) Collected: 03/25/23 2147    Lab Status: Preliminary result Specimen: Blood from 1 Peripheral Draw Updated: 03/26/23 2230     Blood Culture, Routine No Growth at 24 hours    BC-GP Assay [[id number]]  (Abnormal) Collected: 03/25/23 1717    Lab Status: Final result Specimen: Blood from 1 Peripheral Draw Updated: 03/26/23 2059     Staphylococcus species PCR Not Detected     Staphylococcus aureus PCR Not Detected     Staphylococcus epidermidis PCR Not Detected     Staphylococcus lugdunensis PCR Not Detected     Streptococcus species PCR Detected     Streptococcus agalactiae (Group B) PCR Not Detected     Streptococcus pyogenes (Group A) PCR Not Detected     Streptococcus anginosus group PCR Not Detected     Enterococcus faecalis PCR Not Detected     Enterococcus faecium PCR Not Detected    Narrative:      The BC-GP assay is an FDA-cleared Lumine[employer] Verigene rapid molecular test used to identify organisms directly from positive blood cultures.  The assay's performance has been verified by the Clinical Molecular Microbiology Laboratory, [facility].            1/15 Bcx 1/2 strep mitis    Recent Studies  ( RISRSLT )    XR Chest 2 views  Result Date: 03/25/2023  EXAM: XR CHEST 2 VIEWS DATE: 03/25/2023 5:34 PM ACCESSION: [id number] UN DICTATED: 03/25/2023 5:38 PM INTERPRETATION LOCATION: MAIN CAMPUS     CLINICAL INDICATION: 23 years old Female with DYSPNEA      TECHNIQUE: Frontal and lateral views of the chest.     COMPARISON: 10/22/2022.     FINDINGS:     Lung: The lungs appear clear.     Pleura: No pleural effusion or pneumothorax identified.     Mediastinum: The cardiomediastinal silhouette is within normal limits for technique.     Bones: No evidence of acute osseous abnormality is identified.         No radiographic evidence of acute cardiopulmonary pathology.              Subjective:    Initial Consult Documentation from March 27, 2023     Sources of information include: chart review, patient, and family/friend(s).    HPI    23 y.o. 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    Patient underwent total abdominal colectomy with J pouch in 2022. Patient was previously admitted from 03/08/23-03/20/23 with refractory pain in setting of large tumor burden and diarrhea 2/2 pouchitis. She had increased bowel frequency up to 15-20 times daily with associated day time incontinence, urgency and hematochezia. She was started on cefdinir with improvement of symptoms. Pouchoscopy (03/13/23) revealed intact pouch with minimal inflammation and several superficial ulcerations at pouch anastamosis and pre-pouch inlet, suggestive of healing pouchitis. Biopsies with moderately active chronic enteritis, no CMV, granuloma, or dysplasia. She was then discharged on 1/10 on cefdinir 300mg  BID (1/1-1/28) and then transitioned 300mg  daily. She was scheduled for a GI follow-up on 1/22. Patient then presented to [facility] ED on 1/15 due to patient's inability  to tolerate PO and thinks may need TPN.     Upon arrival to [facility] ED, patient was afebrile with WBC to 11.4. She noted since discharge, she has experienced bowel movement frequency of 7-10 daily, the last noted watery without food particles with bright red blood along with severe pain. Blood cultures on 1/15 grew 1/2 strep mitis. She was started on ceftriaxone and vancomycin on 1/16. Patient developed vancomycin infusion syndrome, rashes and redness on her face, neck, and chest area.    Past Medical History   Patient  has a past medical history of Abdominal pain, Acid reflux, Anesthesia complication, Cancer (CMS-HCC), Cataract of right eye, COVID-19 virus infection (01/2019), Cyst of thyroid determined by ultrasound, [name], Difficult intravenous access, [name] (familial adenomatous polyposis), [name], Gastric polyps, History of chemotherapy, History of colon polyps, History of COVID-19 (01/2019), Ileus (CMS-HCC) (03/16/2022), Iron deficiency anemia due to chronic blood loss, PONV (postoperative nausea and vomiting), Rectal bleeding, and Syncopal episodes.      Meds and Allergies  Patient has a current medication list which includes the following prescription(s): acetaminophen, buprenorphine, cefdinir, couriered med or supply, escitalopram oxalate, famotidine, fluticasone propionate, hydrocortisone, lidocaine, lorazepam, naloxegol, naloxone, nirogacestat, ondansetron, oxycodone, pantoprazole, promethazine, scopolamine, sucralfate, and zinc oxide-cod liver oil, and the following Facility-Administered Medications: acetaminophen, buprenorphine, cefTRIAXone (ROCEPHIN) 2 g in sodium chloride 0.9 % (NS) 100 mL IVPB-MBP, cetirizine, cholecalciferol (vitamin d3 25 mcg (1,000 units)), escitalopram oxalate, famotidine, fluticasone propionate, hydrocortisone, hydromorphone (pf), IP OKAY TO TREAT, IP OKAY TO TREAT, IP OKAY TO TREAT, lidocaine, lorazepam, melatonin, naloxegol, nirogacestat, ondansetron, oxycodone, pantoprazole, promethazine (PHENERGAN) 12.5 mg in sodium chloride (NS) 0.9 % 50 mL IVPB, and scopolamine.    Allergies: Adhesive tape-silicones; Ferrlecit [sodium ferric gluconat-sucrose]; Levofloxacin; Methylnaltrexone; Neomycin; Papaya; Morphine; Zosyn [piperacillin-tazobactam]; Compazine [prochlorperazine]; Iron analogues; Reglan [metoclopramide hcl]; Iron dextran; and Latex, natural rubber    Social History  Patient  reports that she has never smoked. She has been exposed to tobacco smoke. She has never used smokeless tobacco. She reports that she does not drink alcohol and does not use drugs.    Scribe's Attest:  Francena Hanly, MD obtained and performed the history, physical exam and medical decision making elements that were entered into the chart. Documentation assistance was provided by me personally. Signed by Paulina Fusi, Scribe, on March 27, 2023 at 3:09 PM.     Provider???s Attestation:   Documentation assistance provided by the Scribe, Paulina Fusi. I was present during the time the encounter was Francena Hanly, MD. March 27, 2023 at 3:09 PM

## 2023-03-27 NOTE — Unmapped (Signed)
Adult Nutrition Assessment Note    Visit Type: MD Consult  Reason for Visit: Assessment (Nutrition)    NUTRITION INTERVENTIONS and RECOMMENDATION     Initiate nutrition by most appropriate route a soon as medically feasible  If corpak placed recommend Osmolite 1.5 Cal at goal rate 65 mL/hr. This provides 2048 kcals, 86 g protein, 278 g carbohydrate, 68 g fat, 0 g fiber, 1041 mL free water, and meets 137% USRDI.   FWF for hydration 125 ml q 4 hrs   Refeeding precautions: start TF @ 20 ml and advance 10 ml q 6 hrs, add multivitamin with minerals and thiamine (300 mg TID x 3 days IV, 100 mg daily x 1 week following), monitor lytes and replete prn  Continue antiemetics  Obtain weight weekly    NUTRITION ASSESSMENT     Patient has received minimal nutrition since 5 due to nausea  and pain  Pt is at risk of refeeding syndrome when nutrition re-initiated     NUTRITIONALLY RELEVANT DATA     HPI & PMH:   23 y.o. female presenting after recent admit with cancer associated pain in the setting of the following pertinent/contributing co-morbidities: desmoid fibromatosis, [name], prior proctocolectomy with ileoanal anastamosis c/b pouchitis.     Nutrition History:   Patient hospitalized 09/25/2022-12/10/2022 for ileus and 12/29 to 1/10 for pouchitis.  She required enteral and parenteral nutrition d/t feeding difficulties. PO intake has been insufficient but in the past 5 days has decreased even more d/t pain and nausea; daily intake of yogurt, Pediasure, crackers. Last Seen by RD 03/16/23. Pt has been having 7-10 watery BMs daily with blood.     Medications:  Nutritionally pertinent medications reviewed and evaluated for potential food and/or medication interactions.  cholecalciferol, zofran, pepcid, scopolamine, vancomycin, prn phenergan    Labs:   Nutritionally pertinent labs reviewed.     Nutritional Needs:   Daily Estimated Nutrient Needs:  Energy: 1610-9604 kcals 30-35 kcal/kg using last recorded weight, 51.3 kg (03/27/23 1327)]  Protein: 77 gm [other (comment) (1.5 gm/kg) using last recorded weight, 51.3 kg (03/27/23 1327)]  Carbohydrate:   [no restriction]  Fluid:   mL [1 mL/kcal (maintenance)]    Anthropometric Data:  Height:     Admission weight: 51.3 kg (113 lb)  Last recorded weight: 51.3 kg (113 lb)  Date of last recorded weight: 03/24/22  IBW:    BMI: Body mass index is 18.8 kg/m??.   Usual Body Weight:  ~130 lbs  Weight Assessment: 22 lb loss over 4 months (16%)    Wt Readings from Last 10 Encounters:   03/25/23 51.3 kg (113 lb)   03/24/23 52.8 kg (116 lb 6.4 oz)   03/17/23 53.5 kg (118 lb)   02/24/23 54.1 kg (119 lb 4.8 oz)   01/23/23 57.4 kg (126 lb 9.6 oz)   01/21/23 58 kg (127 lb 12.8 oz)   01/14/23 58 kg (127 lb 12.8 oz)   12/16/22 58.5 kg (129 lb)   12/16/22 58.5 kg (129 lb)   12/08/22 61.3 kg (135 lb 3.2 oz)        Weight changes this admission:   Last 5 Recorded Weights    03/25/23 1612   Weight: 51.3 kg (113 lb)        Malnutrition Assessment:  Malnutrition Assessment using AND/ASPEN or GLIM Clinical Characteristics:    Severe Protein-Calorie Malnutrition in the context of chronic illness (03/27/23 1311)  Energy Intake: < or equal to 75% of estimated energy requirement for >  or equal to 1 month  Interpretation of Wt. Loss: > 7.5% x 3 month  Malnutrition Score: 2            Nutrition Focused Physical Exam:  Unable to complete at this time due to Pt receiving massage therapy    Care plan:  Completed    Current Nutrition:  NPO  Nutrition Orders            None            Nutritionally Pertinent Allergies, Intolerances, Sensitivities, and/or Cultural/Religious Restrictions:  include papaya    GOALS and EVALUATION     Patient to remain NPO and/or on a clear liquid diet less than 2 days before start of nutrition support. - New    Motivation, Barriers, and Compliance:  Evaluation of motivation, barriers, and compliance completed and include nausea, pain    Discharge Planning:   Monitor for potential discharge needs with multi-disciplinary team.          Follow-Up Parameters:   1-2 times per week (and more frequent as indicated)    Arelia Sneddon RD, LDN, CNSC  Pager [phone]

## 2023-03-27 NOTE — Unmapped (Signed)
Med [facility] Medicine History & Physical    Medicine (Medicine Care Advancement Team) consult note from 03/26/23, written by Dr. Danelle Earthly and attested by me, will serve as medicine admission H&P.      Team Contact Information:   Primary Team: Hospital Medicine [facility]), HOSPITALIST NEW ADMIT  Pager: HOSPITALIST NEW ADMIT [phone])    Kateri Plummer, MD

## 2023-03-27 NOTE — Unmapped (Signed)
=  Department of Anesthesiology  Pain Medicine Division    Chronic Pain inpatient Consult follow up Note      Requesting Attending Physician:  Arman Filter, MD  Service Requesting Consult:  Med [facility])    Assessment/Recommendations:  The patient was seen in consultation on request of Arman Filter, MD regarding assistance with pain management. The patient is not obtaining adequate pain relief on current medication regimen.     [name] [name] is a 23 year old female with a complex medical history, including DVT (RUE), [name] syndrome (post-colectomy), desmoid tumors, anemia, and severe protein-calorie malnutrition. She is followed at [facility] for chronic abdominal and right shoulder pain, consistent with cancer-associated pain. 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 Her pain is further complicated by narcotic bowel syndrome, visceral hyperalgesia, and a positive feedback loop where increased opioid use exacerbates the pain. [name] [name]???s previous hospitalization was complicated by ileus, requiring NG tube, a prolonged inpatient rehab stay.  During her most recent hospitalization she was found to have some pouchitis and was continued on oral antibiotic.     Yesterday, she presented for an outpatient iron infusion but experienced a flare-up of her chronic abdominal pain, and significant nausea nausea. She was admitted to the [facility] emergency department for intractable acute on chronic pain, significant nausea, some hematochezia, dehydration.    Emergency department had contacted [name] on the evening of 03/25/2023 who had provided general recommendation for treatment plan. Please review his note dated 03/25/2023.    I contacted [name] [name]'s primary pain provider, [name], who recommended the following treatment plan:  Continue Butrans patch at 20 mcg/h.  Increase oxycodone liquid to 15-20 mg every 4 hours as needed  Continue HM IV 1 mg q4 hrs.  Avoid ketamine infusion at this time.  Aggressively address nausea symptoms to be able to take PO meds    This plan was discussed with [name] [name], who agreed to the recommendations.     Interval: still has ongoing nausea. Major concern. No vomiting. Has had some cheeselet crackers, water. Discussing with her other team members for option for nutritional support management. Still abdominal pain unchanged. Ongoing (mild increased from baseline) diarrhea frequency with some blood. Hgb stable. VS stable.   No other side effects concerns from opioids.      Recommendations:  -The chronic pain service is a consult service and does not place orders, just makes recommendations (except ketamine and lidocaine infusions)  -Please evaluate all patients on opioids for appropriateness of prescribing narcan at discharge.  The chronic pain service can assist with this.  Nasal narcan covered by most insurance.  -Recommendations given apply to the current hospitalization and do not reflect long term recommendations.     Recommended to   -Continue Butrans patch 20 mcg/h q. Weekly  -Aggressively address nausea symptoms per primary team choice  -Continue oxycodone liquid 20 mg every 4 hours as needed, please encourage patient to utilize this modality, unless she has intractable vomiting despite antiemetic  -Continue hydromorphone 1 mg IV every 4 hours as needed.  -Continue Tylenol 1 g 3 times daily    We will see her again on Monday. Primary team can continue above management and after discussion with patient can attempt to  wean IV if pain improved over the weekend (either by weaning frequency or dose at a time). Chronic pain service will be available to contact via pager.     Please prescribe naloxone at discharge.  Nasal narcan for most insured (Nasal narcan 4mg /actuation, prescribe 1 kit, instructions at [email or web address]).  For uninsured, chronic pain can work to assist in finding an option.  OTC nasal narcan now available at most pharmacies for around $45.    Allergies    Allergies   Allergen Reactions    Adhesive Tape-Silicones Hives and Rash     Paper tape and Tegederm ok. Biopatch causes blistering but has tolerated CHG Tegaderm    Ferrlecit [Sodium Ferric Gluconat-Sucrose] Swelling and Rash    Levofloxacin Swelling and Rash     Swelling in mouth, rash,     Methylnaltrexone      Per Patient: I lost bowel control, severe abdominal cramping, and elevated BP    Neomycin Swelling     Rxn after ear drops; ear swelling    Papaya Hives    Morphine Nausea And Vomiting    Zosyn [Piperacillin-Tazobactam] Itching and Rash     Red and itchy    Compazine [Prochlorperazine] Other (See Comments)     Extreme agitation    Iron Analogues     Reglan [Metoclopramide Hcl] Diarrhea    Iron Dextran Itching     Received iron dextran 06/08/22 over 12 hours, had itching and redness/flushing during the infusion and for a couple days after. Required IV benadryl w/flares between doses and ultimately treated w/IV methylpred for 2 days.     Latex, Natural Rubber Rash       Home Medications    (Not in a hospital admission)      Inpatient Medications  Current Facility-Administered Medications   Medication Dose Route Frequency Provider Last Rate Last Admin    acetaminophen (TYLENOL) tablet 1,000 mg  1,000 mg Oral Q8H Dholakia, Vibhaben, AGNP        buprenorphine 20 mcg/hour transdermal patch 1 patch  1 patch Transdermal Weekly Dholakia, Vibhaben, AGNP   1 patch at 03/26/23 1610    cefdinir (OMNICEF) capsule 300 mg  300 mg Oral Q12H Dholakia, Vibhaben, AGNP        Followed by    Melene Muller ON 04/08/2023] cefdinir (OMNICEF) capsule 300 mg  300 mg Oral Daily Dholakia, Vibhaben, AGNP        cetirizine (ZYRTEC) tablet 20 mg  20 mg Oral BID Dholakia, Vibhaben, AGNP        cholecalciferol (vitamin D3 25 mcg (1,000 units)) tablet 25 mcg  25 mcg Oral Daily Dholakia, Vibhaben, AGNP        diphenhydrAMINE (BENADRYL) injection  50 mg Intravenous Once PRN Arman Filter, MD        vancomycin (VANCOCIN) 750 mg in dextrose 5 % 150 mL IVPB (premix)  750 mg Intravenous Q8H Arman Filter, MD 75 mL/hr at 03/27/23 1029 750 mg at 03/27/23 1029    And    diphenhydrAMINE (BENADRYL) oral elixir  50 mg Oral Q8H Arman Filter, MD        escitalopram oxalate (LEXAPRO) tablet 5 mg  5 mg Oral Daily Dholakia, Vibhaben, AGNP   5 mg at 03/27/23 0845    famotidine (PEPCID) tablet 20 mg  20 mg Oral Nightly Dholakia, Vibhaben, AGNP        fluticasone propionate (FLONASE) 50 mcg/actuation nasal spray 1 spray  1 spray Topical Weekly Dholakia, Vibhaben, AGNP  1 spray at 03/26/23 0027    hydrocortisone 2.5 % cream   Topical BID PRN Dholakia, Vibhaben, AGNP        HYDROmorphone (PF) (DILAUDID) injection 1 mg  1 mg Intravenous Q4H PRN Arman Filter, MD   1 mg at 03/27/23 1028    IP OKAY TO TREAT   Other Continuous PRN Dholakia, Vibhaben, AGNP        IP OKAY TO TREAT   Other Continuous PRN Dholakia, Vibhaben, AGNP        IP OKAY TO TREAT   Other Continuous PRN Dholakia, Vibhaben, AGNP        lidocaine (ASPERCREME) 4 % 1 patch  1 patch Transdermal Daily Dholakia, Vibhaben, AGNP   1 patch at 03/27/23 0845    LORazepam (ATIVAN) tablet 1 mg  1 mg Oral BID PRN Dholakia, Vibhaben, AGNP        melatonin tablet 3 mg  3 mg Oral Nightly PRN Dholakia, Vibhaben, AGNP        naloxegol (MOVANTIK) 12.5 mg tablet 12.5 mg  12.5 mg Oral Daily PRN Dholakia, Vibhaben, AGNP        nirogacestat (OGSIVEO) tablet 150 mg **PATIENT SUPPLIED**  150 mg Oral BID Dholakia, Vibhaben, AGNP        ondansetron (ZOFRAN) injection 8 mg  8 mg Intravenous Q8H Dholakia, Vibhaben, AGNP   8 mg at 03/27/23 0538    oxyCODONE (ROXICODONE) 5 mg/5 mL solution 20 mg 20 mg Oral Q4H PRN Dholakia, Vibhaben, AGNP   20 mg at 03/27/23 0308    pantoprazole (Protonix) EC tablet 40 mg  40 mg Oral Daily before breakfast Dholakia, Vibhaben, AGNP        promethazine (PHENERGAN) 12.5 mg in sodium chloride (NS) 0.9 % 50 mL IVPB  12.5 mg Intravenous Q6H PRN Dholakia, Vibhaben, AGNP   Stopped at 03/27/23 1054    scopolamine (TRANSDERM-SCOP) 1 mg over 3 days topical patch 1 mg  1 patch Topical Q72H Dholakia, Vibhaben, AGNP   1 mg at 03/26/23 8295     Current Outpatient Medications   Medication Sig Dispense Refill    acetaminophen (TYLENOL) 500 MG tablet Take 2 tablets (1,000 mg total) by mouth every eight (8) hours.      buprenorphine (BUTRANS) 20 mcg/hour PTWK transdermal patch Place 1 patch on the skin every seven (7) days. 4 patch 1    cefdinir (OMNICEF) 300 MG capsule Take 1 capsule (300 mg total) by mouth every twelve (12) hours for 19 days, THEN 1 capsule (300 mg total) daily for 28 days. 66 capsule 0    COURIERED MED OR SUPPLY OGSVEO 50 MG TABLET. 1 each 0    escitalopram oxalate (LEXAPRO) 5 MG tablet Take 1 tablet (5 mg total) by mouth daily. 30 tablet 0    famotidine (PEPCID) 20 MG tablet Take 1 tablet (20 mg total) by mouth nightly. 30 tablet 3    fluticasone propionate (FLONASE) 50 mcg/actuation nasal spray Use 1 spray under the butrans patch once every 7 days to prevent allergic reaction to the adhesive      hydrocortisone 2.5 % cream Apply topically two (2) times a day as needed (hemorrhoids). 30 g 0    lidocaine (ASPERCREME) 4 % patch Place 1 patch on the skin daily as needed.      LORazepam (ATIVAN) 1 MG tablet Take 1 tablet (1 mg total) by mouth two (2) times a day as needed for anxiety. 60 tablet 0    naloxegol (MOVANTIK)  12.5 mg tablet Take 1 tablet (12.5 mg total) by mouth daily. (Patient taking differently: Take 1 tablet (12.5 mg total) by mouth daily as needed.) 30 tablet 0    naloxone (NARCAN) 4 mg nasal spray One spray in either nostril once for known/suspected opioid overdose. May repeat every 2-3 minutes in alternating nostril til EMS arrives 2 each 0    nirogacestat (OGSIVEO) 50 mg tablet Take 3 tablets (150 mg total) by mouth two (2) times a day. Swallow tablets whole; do not break, crush, or chew. 180 tablet 5    ondansetron (ZOFRAN-ODT) 8 MG disintegrating tablet Dissolve 1 tablet (8 mg total) in the mouth every eight (8) hours as needed for nausea. 90 tablet 0    oxyCODONE (ROXICODONE) 5 mg/5 mL solution Take 20 mL (20 mg total) by mouth every four (4) hours as needed for up to 7 days. 840 mL 0    pantoprazole (PROTONIX) 40 MG tablet Take 1 tablet (40 mg total) by mouth daily before breakfast. 30 tablet 0    promethazine (PHENERGAN) 12.5 MG tablet Take 1 tablet (12.5 mg total) by mouth every twelve (12) hours as needed for nausea. 14 tablet 0    scopolamine (TRANSDERM-SCOP) 1 mg over 3 days Place 1 patch (1 mg total) on the skin every third day. 10 patch 2    sucralfate (CARAFATE) 100 mg/mL suspension Take 10 mL (1 g total) by mouth four (4) times a day as needed.      zinc oxide-cod liver oil (DESITIN 40%) 40 % Pste Apply topically daily as needed. 57 g 0         Past Medical History    Past Medical History:   Diagnosis Date    Abdominal pain     Acid reflux     occas    Anesthesia complication     itching, shaking, coldness; last few surgeries have gone much better    Cancer (CMS-HCC)     Cataract of right eye     COVID-19 virus infection 01/2019    Cyst of thyroid determined by ultrasound     monitoring    [name]     2 right forearm, 1 left thigh, 1 right scapula, 1 under left clavicle; multiple    Difficult intravenous access     [name] (familial adenomatous polyposis)     [name]     Gastric polyps     History of chemotherapy     last treatment approx 05/2019    History of colon polyps     History of COVID-19 01/2019    Ileus (CMS-HCC) 03/16/2022    Iron deficiency anemia due to chronic blood loss     received iron infusion 11-2019    PONV (postoperative nausea and vomiting)     Rectal bleeding     Syncopal episodes     especially if becoming dehydrated           Past Surgical History    Past Surgical History:   Procedure Laterality Date    COLON SURGERY      cyroablation      cystis removal      desmoid removal      PR CLOSE ENTEROSTOMY,RESEC+ANAST N/A 10/09/2020    Procedure: ILEOSTOMY TAKEDOWN;  Surgeon: Mickle Asper, MD;  Location: OR [place];  Service: General Surgery    PR COLONOSCOPY W/BIOPSY SINGLE/MULTIPLE N/A 10/27/2012    Procedure: COLONOSCOPY, FLEXIBLE, PROXIMAL TO SPLENIC FLEXURE; WITH BIOPSY, SINGLE OR MULTIPLE;  Surgeon: Shirlyn Goltz Mir, MD;  Location: PEDS PROCEDURE ROOM [facility];  Service: Gastroenterology    PR COLONOSCOPY W/BIOPSY SINGLE/MULTIPLE N/A 09/14/2013    Procedure: COLONOSCOPY, FLEXIBLE, PROXIMAL TO SPLENIC FLEXURE; WITH BIOPSY, SINGLE OR MULTIPLE;  Surgeon: Shirlyn Goltz Mir, MD;  Location: PEDS PROCEDURE ROOM [facility];  Service: Gastroenterology    PR COLONOSCOPY W/BIOPSY SINGLE/MULTIPLE N/A 11/08/2014    Procedure: COLONOSCOPY, FLEXIBLE, PROXIMAL TO SPLENIC FLEXURE; WITH BIOPSY, SINGLE OR MULTIPLE;  Surgeon: Arnold Long Mir, MD;  Location: PEDS PROCEDURE ROOM [facility];  Service: Gastroenterology    PR COLONOSCOPY W/BIOPSY SINGLE/MULTIPLE N/A 12/26/2015    Procedure: COLONOSCOPY, FLEXIBLE, PROXIMAL TO SPLENIC FLEXURE; WITH BIOPSY, SINGLE OR MULTIPLE;  Surgeon: Arnold Long Mir, MD;  Location: PEDS PROCEDURE ROOM [facility];  Service: Gastroenterology    PR COLONOSCOPY W/BIOPSY SINGLE/MULTIPLE N/A 09/02/2017    Procedure: COLONOSCOPY, FLEXIBLE, PROXIMAL TO SPLENIC FLEXURE; WITH BIOPSY, SINGLE OR MULTIPLE;  Surgeon: Arnold Long Mir, MD;  Location: PEDS PROCEDURE ROOM [place];  Service: Gastroenterology    PR COLSC FLX W/REMOVAL LESION BY HOT BX FORCEPS N/A 08/27/2016    Procedure: COLONOSCOPY, FLEXIBLE, PROXIMAL TO SPLENIC FLEXURE; W/REMOVAL TUMOR/POLYP/OTHER LESION, HOT BX FORCEP/CAUTE;  Surgeon: Arnold Long Mir, MD;  Location: PEDS PROCEDURE ROOM [facility]; Service: Gastroenterology    PR COLSC FLX W/RMVL OF TUMOR POLYP LESION SNARE TQ N/A 02/25/2019    Procedure: COLONOSCOPY FLEX; W/REMOV TUMOR/LES BY SNARE;  Surgeon: Helyn Numbers, MD;  [facility]: [place] PROCED[facility] [place] [facility];  Service: Gastroenterology    PR COLSC FLX W/RMVL OF TUMOR POLYP LESION SNARE TQ N/A 03/13/2020    Procedure: COLONOSCOPY FLEX; W/REMOV TUMOR/LES BY SNARE;  Surgeon: Helyn Numbers, MD;  Location: [facility]EDURES [place] [facility];  Service: Gastroenterology    PR EXC SKIN BENIG 2.1-3 CM TRUNK,ARM,LEG Right 02/25/2017    Procedure: EXCISION, BENIGN LESION INCLUDE MARGINS, EXCEPT SKIN TAG, LEGS; EXCISED DIAMETER 2.1 TO 3.0 CM;  Surgeon: Clarene Duke, MD;  Location: CHILDRENS OR [facility];  Service: Plastics    PR EXC SKIN BENIG 3.1-4 CM TRUNK,ARM,LEG Right 02/25/2017    Procedure: EXCISION, BENIGN LESION INCLUDE MARGINS, EXCEPT SKIN TAG, ARMS; EXCISED DIAMETER 3.1 TO 4.0 CM;  Surgeon: Clarene Duke, MD;  Location: CHILDRENS OR [facility];  Service: Plastics    PR EXC SKIN BENIG >4 CM FACE,FACIAL Right 02/25/2017    Procedure: EXCISION, OTHER BENIGN LES INCLUD MARGIN, FACE/EARS/EYELIDS/NOSE/LIPS/MUCOUS MEMBRANE; EXCISED DIAM >4.0 CM;  Surgeon: Clarene Duke, MD;  Location: CHILDRENS OR [facility];  Service: Plastics    PR EXC TUMOR SOFT TISSUE LEG/ANKLE SUBQ 3+CM Right 08/05/2019    Procedure: EXCISION, TUMOR, SOFT TISSUE OF LEG OR ANKLE AREA, SUBCUTANEOUS; 3 CM OR GREATER;  Surgeon: Arsenio Katz, MD;  Location: MAIN OR [place];  Service: Plastics    PR EXC TUMOR SOFT TISSUE LEG/ANKLE SUBQ <3CM Right 08/05/2019    Procedure: EXCISION, TUMOR, SOFT TISSUE OF LEG OR ANKLE AREA, SUBCUTANEOUS; LESS THAN 3 CM;  Surgeon: Arsenio Katz, MD;  Location: MAIN OR [facility];  Service: Plastics    PR LAP, SURG PROCTECTOMY W J-POUCH N/A 08/10/2020    Procedure: ROBOTIC ASSISTED LAPAROSCOPIC PROCTOCOLECTOMY, ILEAL J POUCH, WITH OSTOMY;  Surgeon: Mickle Asper, MD;  Location: OR [place]; Service: General Surgery    PR NDSC EVAL INTSTINAL POUCH DX W/COLLJ SPEC SPX N/A 01/23/2021    Procedure: ENDO EVAL SM INTEST POUCH; DX;  Surgeon: Modena Nunnery, MD;  Location: [place] [facility]ED[facility] [place] [facility];  Service: Gastroenterology    PR NDSC EVAL INTSTINAL POUCH  DX W/COLLJ SPEC SPX N/A 08/27/2021    Procedure: ENDO EVAL SM INTEST POUCH; DX;  Surgeon: Hunt Oris, MD;  Location: GI PROCEDURES [facility];  Service: Gastroenterology    PR NDSC EVAL INTSTINAL POUCH DX W/COLLJ SPEC SPX N/A 12/09/2021    Procedure: ENDO EVAL SM INTEST POUCH; DX;  Surgeon: Vidal Schwalbe, MD;  Location: GI PROCEDURES [facility];  Service: Gastroenterology    PR NDSC EVAL INTSTINAL POUCH DX W/COLLJ [facility] SPX Left 04/09/2022    Procedure: ENDO EVAL SM INTEST POUCH; DX;  Surgeon: Modena Nunnery, MD;  Location: [place] [facility] [place] [facility];  Service: Gastroenterology    PR NDSC EVAL INTSTINAL POUCH DX W/COLLJ SPEC SPX N/A 08/05/2022    Procedure: ENDO EVAL SM INTEST POUCH; DX;  Surgeon: Modena Nunnery, MD;  Location: GI PROCEDURES [facility];  Service: Gastroenterology    PR NDSC EVAL INTSTINAL POUCH DX W/COLLJ SPEC SPX N/A 03/13/2023    Procedure: ENDO EVAL SM INTEST POUCH; DX;  Surgeon: Carmon Ginsberg, MD;  Location: GI PROCEDURES [facility];  Service: Gastroenterology    PR NDSC EVAL INTSTINAL POUCH W/BX SINGLE/MULTIPLE N/A 01/20/2022    Procedure: ENDOSCOPIC EVAL OF SMALL INTESTINAL POUCH; DIAGNOSTIC, No biopsies;  Surgeon: Andrey Farmer, MD;  Location: GI PROCEDURES [facility];  Service: Gastroenterology    PR NDSC EVAL INTSTINAL POUCH W/BX SINGLE/MULTIPLE N/A 02/13/2022    Procedure: ENDOSCOPIC EVAL OF SMALL INTESTINAL POUCH; DIAGNOSTIC, WITH BIOPSY;  Surgeon: Bronson Curb, MD;  Location: GI PROCEDURES [facility];  Service: Gastroenterology    PR NDSC EVAL INTSTINAL POUCH W/BX SINGLE/MULTIPLE N/A 03/13/2023    Procedure: ENDOSCOPIC EVAL OF SMALL INTESTINAL POUCH; DIAGNOSTIC, WITH BIOPSY;  Surgeon: Carmon Ginsberg, MD;  Location: GI PROCEDURES [facility];  Service: Gastroenterology    PR UNLISTED PROCEDURE SMALL INTESTINE  01/23/2021    Procedure: UNLISTED PROCEDURE, SMALL INTESTINE;  Surgeon: Modena Nunnery, MD;  Location: [facility] [place] [facility];  Service: Gastroenterology    PR UNLISTED PROCEDURE SMALL INTESTINE  02/13/2022    Procedure: UNLISTED PROCEDURE, SMALL INTESTINE;  Surgeon: Bronson Curb, MD;  Location: GI PROCEDURES [facility];  Service: Gastroenterology    PR UPPER GI ENDOSCOPY,BIOPSY N/A 10/27/2012    Procedure: UGI ENDOSCOPY; WITH BIOPSY, SINGLE OR MULTIPLE;  Surgeon: Shirlyn Goltz Mir, MD;  Location: PEDS PROCEDURE ROOM [facility];  Service: Gastroenterology    PR UPPER GI ENDOSCOPY,BIOPSY N/A 09/14/2013    Procedure: UGI ENDOSCOPY; WITH BIOPSY, SINGLE OR MULTIPLE;  Surgeon: Shirlyn Goltz Mir, MD;  Location: PEDS PROCEDURE ROOM [facility];  Service: Gastroenterology    PR UPPER GI ENDOSCOPY,BIOPSY N/A 11/08/2014    Procedure: UGI ENDOSCOPY; WITH BIOPSY, SINGLE OR MULTIPLE;  Surgeon: Arnold Long Mir, MD;  Location: PEDS PROCEDURE ROOM [facility];  Service: Gastroenterology    PR UPPER GI ENDOSCOPY,BIOPSY N/A 12/26/2015    Procedure: UGI ENDOSCOPY; WITH BIOPSY, SINGLE OR MULTIPLE;  Surgeon: Arnold Long Mir, MD;  Location: PEDS PROCEDURE ROOM [facility];  Service: Gastroenterology    PR UPPER GI ENDOSCOPY,BIOPSY N/A 08/27/2016    Procedure: UGI ENDOSCOPY; WITH BIOPSY, SINGLE OR MULTIPLE;  Surgeon: Arnold Long Mir, MD;  Location: PEDS PROCEDURE ROOM [facility];  Service: Gastroenterology    PR UPPER GI ENDOSCOPY,BIOPSY N/A 09/02/2017    Procedure: UGI ENDOSCOPY; WITH BIOPSY, SINGLE OR MULTIPLE;  Surgeon: Arnold Long Mir, MD;  Location: PEDS PROCEDURE ROOM [facility];  Service: Gastroenterology    PR UPPER GI ENDOSCOPY,BIOPSY N/A 03/13/2020    Procedure: UGI ENDOSCOPY; WITH BIOPSY,  SINGLE OR MULTIPLE;  Surgeon: Helyn Numbers, MD;  Location: [facility] [place] [facility];  Service: Gastroenterology    PR UPPER GI ENDOSCOPY,BIOPSY N/A 09/05/2021    Procedure: UGI ENDOSCOPY; WITH BIOPSY, SINGLE OR MULTIPLE;  Surgeon: Wendall Papa, MD;  Location: GI PROCEDURES [facility];  Service: Gastroenterology    PR UPPER GI ENDOSCOPY,DIAGNOSIS N/A 01/20/2022    Procedure: UGI ENDO, INCLUDE ESOPHAGUS, STOMACH, & DUODENUM &/OR JEJUNUM; DX W/WO COLLECTION SPECIMN, BY BRUSH OR WASH;  Surgeon: Andrey Farmer, MD;  Location: GI PROCEDURES [facility];  Service: Gastroenterology    TUMOR REMOVAL      multiple-head, neck, back, hand, right flank, multiple           Family History    Family History   Problem Relation Age of Onset    No Known Problems Mother     No Known Problems Father     No Known Problems Sister     No Known Problems Brother     Stroke Maternal Grandmother     Other Maternal Grandmother         benign lesions of liver and pancreas, further details unknown    Cancer Maternal Grandmother     Diabetes Maternal Grandmother     Hypertension Maternal Grandmother     Thyroid disease Maternal Grandmother     Arthritis Maternal Grandfather     Asthma Maternal Grandfather     COPD Paternal Grandmother         Deceased    Miscarriages / Stillbirths Paternal Grandmother     Alcohol abuse Paternal Grandfather         Deceased    No Known Problems Maternal Aunt     No Known Problems Maternal Uncle     No Known Problems Paternal Aunt     No Known Problems Paternal Uncle     Anesthesia problems Neg Hx     Broken bones Neg Hx     Cancer Neg Hx     Clotting disorder Neg Hx     Collagen disease Neg Hx     Diabetes Neg Hx     Dislocations Neg Hx     Fibromyalgia Neg Hx     Gout Neg Hx     Hemophilia Neg Hx     Osteoporosis Neg Hx     Rheumatologic disease Neg Hx     Scoliosis Neg Hx     Severe sprains Neg Hx     Sickle cell anemia Neg Hx     Spinal Compression Fracture Neg Hx     Melanoma Neg Hx     Basal cell carcinoma Neg Hx     Squamous cell carcinoma Neg Hx          Social History:  Social History     Tobacco Use   Smoking Status Never    Passive exposure: Past   Smokeless Tobacco Never     Social History     Substance and Sexual Activity   Alcohol Use Never     Social History     Substance and Sexual Activity   Drug Use Never         Lab Results   Component Value Date    CREATININE 0.62 03/27/2023       Lab Results   Component Value Date    ALKPHOS 61 03/27/2023    BILITOT 0.8 03/27/2023    BILIDIR <0.10 03/17/2023    PROT 6.4 03/27/2023  ALBUMIN 3.5 03/27/2023    ALT 18 03/27/2023    AST 24 03/27/2023       Encounter Date: 03/08/23   ECG 12 Lead   Result Value    EKG Systolic BP     EKG Diastolic BP     EKG Ventricular Rate 72    EKG Atrial Rate 72    EKG P-R Interval 134    EKG QRS Duration 86    EKG Q-T Interval 418    EKG QTC Calculation 457    EKG Calculated P Axis 49    EKG Calculated R Axis 16    EKG Calculated T Axis 35    QTC Fredericia 444    Narrative    NORMAL SINUS RHYTHM  NORMAL ECG  WHEN COMPARED WITH ECG OF 08-Mar-2023 23:46,  NO SIGNIFICANT CHANGE WAS FOUND  Confirmed by Schuyler Amor ([id number]) on 03/15/2023 2:59:17 PM       No results found for requested labs within last 30 days.       Objective:     Vital Signs  Temp:  [36.6 ??C (97.8 ??F)-37.3 ??C (99.1 ??F)] 36.7 ??C (98 ??F)  Heart Rate:  [69-100] 82  SpO2 Pulse:  [70-99] 82  Resp:  [10-27] 12  BP: (96-136)/(55-81) 107/63  MAP (mmHg):  [67-89] 76  SpO2:  [96 %-100 %] 98 %    Physical Exam    GENERAL:  Well developed, well-nourished female and is in no apparent distress.   HEAD/NECK:    Reveals normocephalic/atraumatic.   CARDIOVASCULAR:   Regular rate  LUNGS:   Normal work of breathing, no supplemental 02  EXTREMITIES:  Warm, no clubbing, cyanosis, or edema was noted.  NEUROLOGIC:    The patient was alert and oriented times four with normal language, attention, cognition and memory. Cranial nerve exam was grossly normal.  MUSCULOSKELETAL:    Moving all 4 extremities  SKIN:  No obvious rashes lesions or erythema  PSY:  Appropriate affect and mood.      Problem List    Principal Problem:    Cancer associated pain  Active Problems:    [name]    Pain medication agreement signed    Pouchitis (CMS-HCC)

## 2023-03-27 NOTE — Unmapped (Addendum)
[name] [name] is a 23 y.o. 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      # Positive blood cx Strep mitis  Blood cultures collected as part of initial ED evaluation grew Strep mitis in 1 of 2 sets. ID consulted and she was treated with vancomycin and ertapenem, subsequently narrowed to vancomycin alone for total of 7 days. Repeat cultures 03/27/2023 negative. Strongly suspect contaminant due to suboptimal technique. Note that she developed itching with vancomycin infusion that required slower infusion and pretreatment with diphenhydramine.    # Presyncope  Inadequate hydration, weight loss are plausible causes. Improved with restoration of adequate hydration and nutrition.     Poor PO intake / Nausea  Severe Protein-Calorie Malnutrition in the context of chronic illness (03/27/23 1311)  As extensively documented in prior admissions and outpatient visits, she has struggled with constellation of nausea, dyspepsia, bloating, abdominal pain, loose stools and aversion to oral intake for years. She has complex GI history and has been supported with TPN in the past. Tempo of symptoms has accelerated since mid-2024. Had ND tube (Corpak) placed during prolonged admission 09/2022 - 11/2022, which remained in place through 02/2023 as outpatient team was working on weaning from tube feeds. She continued to report poor intake and was losing weight since tube was removed (54.1 kg on 02/24/2023 --> 52.8 kg on 03/24/2023), though weight trend difficult to interpret in context of significant anasarca that developed in mid-2024.    It is in that context that she requested admission for re-initiation of TPN when she came to an infusion appointment on 03/25/2023. This has been discussed with her primary outpatient providers including Dr [name] (GI / IBD), Dr Carmon Sails (GI / nutrition), Dr [name] (oncology), and [name] NP (oncology) who recommended against TPN. Abnormal gut anatomy notwithstanding, it is the consensus among multiple specialties that oral and (if that fails) enteral feeding should be sufficient to meet caloric and hydration needs. Objective data was reassuring -- normotensive, not tachycardic, moist mucous membranes, benign abdominal exam, normal electrolytes, albumin 3.7. Direct admission was not felt to be warranted, but she proceeded to the ED for evaluation and was subsequently admitted to the hospitalist service. GI service was consulted and recommended maximizing supportive care for nausea, dyspepsia while addressing other contributors to nausea and aversion to oral intake. Despite these efforts she continued to have little meaningful intake even of oral medications so another ND tube was placed on 03/29/2023 and feeds slowly titrated up to goal. ***feeding regimen at discharge    # Acute on chronic abdominal pain  Complex situation discussed at length in prior notes. In brief, her pain and nausea are (deeply) multifactorial but consensus among treatment team is that new / unknown organic issue at time of this admission was unlikely. No cross-sectional imaging or endoscopy was performed. Anesthesia chronic pain service was consulted and recommended adhering to plan set out by her outpatient prescriber (Dr Darlis Loan): buprenorphine 20 mcg/h patch, oxycodone 20 mg po q4, hydromorphone 1 mg IV q4 to be used only after trying oral medications.***    # Recent mild pouchitis  # Ongoing small-volume hematochezia  Patient with total abdominal colectomy with J pouch in 2022. Recent admission (12/29-1/10) for pouchitis, with pouchoscopy 03/13/23 showing minimal inflammation consistent with healing pouchitis. She had been taking cefdinir for planned  8 week course (300 mg BID 1/1-1/28, then 300 mg daily 1/29-2/25). She reported ongoing BRBPR with each bowel movement and frequent watery stools. Her vital signs and Hgb were stable on admission (Hgb 12.8) and VSS.   GI consulted and recommended no indication for repeat pouchoscopy.   Held cefdinir while treating Strep mitis bacteremia with ertapenem as above, then resumed.     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 [name]. Patient has numerous sites of disease but most bothersome has been mesenteric sites which have caused chronic abdominal pain, nausea/vomiting. She presents at this time due to inadequately controlled abdominal pain and nausea, which have made it difficult for her to take in food or water. Please see Dr. Brynda Rim note from 1/14 addended 1/15 with his very thoughtful assessment of this situation and current hospitalization.   Continued home nirogacestat as tolerated. Patient tried holding it for a few days to see if it helped abdominal symptoms***

## 2023-03-27 NOTE — Unmapped (Signed)
Massage Therapy Inpatient Consult Note    Patient: [name] [name]   Pronouns: she/her/hers    Date of birth: September 27, 2000    Service: Clinical Massage    Requesting Attending Physician :  Arman Filter, MD  Service Requesting Consult : Med [facility])  Reason for Consult: relaxation    Visit Date: 03/27/2023    Precautions: Enteric     Allergies:  Adhesive tape-silicones; Ferrlecit [sodium ferric gluconat-sucrose]; Levofloxacin; Methylnaltrexone; Neomycin; Papaya; Morphine; Zosyn [piperacillin-tazobactam]; Compazine [prochlorperazine]; Iron analogues; Reglan [metoclopramide hcl]; Iron dextran; and Latex, natural rubber    Code Status:  Full Code    ----------------------------------------------------------------    Visit Summary:  [name] [name] and her mom, Natalia Leatherwood were in the room when I arrived today. Vancomycin was running, and about 20 minutes into the session she started to itch and have some flushing on the skin of her face and neck (that I could see). I reached out to the RN and she gave her some IV benadryl.     [name] [name] didn't express any discomfort related to the massage itself today. I tried some acupressure points meant to relieve nausea and diarrhea. I worked on some range of motion at her ankles and had her dorsiflex and push against my hands. I encouraged her to try to move in the bed as much as possible.     I am so grateful to be a small part of this family's care team! Please reach out on EPIC chat anytime she requests a session and I'll do my best to be there for her.   ----------------------------------------------------------------    Treatment Goals: To assist with the management of pain and tension due to disease, treatment, and prolonged hospital stay.       Procedure Detail: Effleurage, Static holds/Light compression, Gentle rocking/vibration, Nerve strokes, Comfort touch, Joint mobilization, and Acupressure    ----------------------------------------------------------------  I use a modified Walton Pressure Scale (WPS) in an effort to standardize documentation of this critical component of any touch therapy. My hope is to make the pressure I use clearly understandable to all team members involved in the care of each patient I am fortunate to work with.    Level 0: No pressure. Energy work or Management consultant. (No tissue is moved)   Level zero is not included in the WPS, I added it for my practice.    Level 1: Light lotioning (Only the skin is moved)  Level 2: Heavy lotioning (Similar to rubbing in sunscreen. Slight displacement of the superficial adipose layer and superficial muscles)  Level 3: Medium pressure (Medium layers of adipose, muscle, and blood vessels are moved, adjacent tissues move slightly)    Levels 4 and 5 are only used with healthy patients. I do not go beyond a level 3 in my practice.    Level 4: Strong pressure (Deep layers of adipose, muscle, blood vessels, and fascia are moved)  Level 5: (Significant compression of all tissues. Therapists bone affects patient's bone)  ----------------------------------------------------------------    Pressure Level: 0-1 and 0-2    Position: Semi-fowler    Areas of the body massaged: Head/Scalp, Face, Shoulders, and Lower extremities INcluding feet      Areas of the body avoided:       Lubricant Used: Hospital lotion with essential oil added:lavender and rosemary    Music: Spa Music      Treatment Time: 35 minutes    Total time spent on this patient's case today: 2+ hours    This is  visit #: 13    ----------------------------------------------------------------  Lab Results   Component Value Date    PLT 200 03/27/2023       Current Oncology Treatment: Chemotherapy, Surgery, and Other    Site Restrictions : Active infection, Tumor, and IV    Position Restrictions: As comfort allows    Personal Protective Equipment Used: Mask and Gloves    ----------------------------------------------------------------    Before treatment: (None / Some / A Lot) or N/A           Pain: Some  Stress/Tension: A lot  Swelling: N/A      After treatment: (Better / Same / Worse) or N/A    Pain: N/A  Stress/Tension: Better  Swelling: N/A    ------------------------------------------------------------------    Surgical History:  Past Surgical History:   Procedure Laterality Date    COLON SURGERY      cyroablation      cystis removal      desmoid removal      PR CLOSE ENTEROSTOMY,RESEC+ANAST N/A 10/09/2020    Procedure: ILEOSTOMY TAKEDOWN;  Surgeon: Mickle Asper, MD;  Location: OR [place];  Service: General Surgery    PR COLONOSCOPY W/BIOPSY SINGLE/MULTIPLE N/A 10/27/2012    Procedure: COLONOSCOPY, FLEXIBLE, PROXIMAL TO SPLENIC FLEXURE; WITH BIOPSY, SINGLE OR MULTIPLE;  Surgeon: Shirlyn Goltz Mir, MD;  Location: PEDS PROCEDURE ROOM [place];  Service: Gastroenterology    PR COLONOSCOPY W/BIOPSY SINGLE/MULTIPLE N/A 09/14/2013    Procedure: COLONOSCOPY, FLEXIBLE, PROXIMAL TO SPLENIC FLEXURE; WITH BIOPSY, SINGLE OR MULTIPLE;  Surgeon: Shirlyn Goltz Mir, MD;  Location: PEDS PROCEDURE ROOM [place];  Service: Gastroenterology    PR COLONOSCOPY W/BIOPSY SINGLE/MULTIPLE N/A 11/08/2014    Procedure: COLONOSCOPY, FLEXIBLE, PROXIMAL TO SPLENIC FLEXURE; WITH BIOPSY, SINGLE OR MULTIPLE;  Surgeon: Arnold Long Mir, MD;  Location: PEDS PROCEDURE ROOM [facility];  Service: Gastroenterology    PR COLONOSCOPY W/BIOPSY SINGLE/MULTIPLE N/A 12/26/2015    Procedure: COLONOSCOPY, FLEXIBLE, PROXIMAL TO SPLENIC FLEXURE; WITH BIOPSY, SINGLE OR MULTIPLE;  Surgeon: Arnold Long Mir, MD;  Location: PEDS PROCEDURE ROOM [facility];  Service: Gastroenterology    PR COLONOSCOPY W/BIOPSY SINGLE/MULTIPLE N/A 09/02/2017    Procedure: COLONOSCOPY, FLEXIBLE, PROXIMAL TO SPLENIC FLEXURE; WITH BIOPSY, SINGLE OR MULTIPLE;  Surgeon: Arnold Long Mir, MD;  Location: PEDS PROCEDURE ROOM [place];  Service: Gastroenterology    PR COLSC FLX W/REMOVAL LESION BY HOT BX FORCEPS N/A 08/27/2016    Procedure: COLONOSCOPY, FLEXIBLE, PROXIMAL TO SPLENIC FLEXURE; W/REMOVAL TUMOR/POLYP/OTHER LESION, HOT BX FORCEP/CAUTE;  Surgeon: Arnold Long Mir, MD;  Location: PEDS PROCEDURE ROOM [facility];  Service: Gastroenterology    PR COLSC FLX W/RMVL OF TUMOR POLYP LESION SNARE TQ N/A 02/25/2019    Procedure: COLONOSCOPY FLEX; W/REMOV TUMOR/LES BY SNARE;  Surgeon: Helyn Numbers, MD;  Location: GI PROCEDURES [place] [facility];  Service: Gastroenterology    PR COLSC FLX W/RMVL OF TUMOR POLYP LESION SNARE TQ N/A 03/13/2020    Procedure: COLONOSCOPY FLEX; W/REMOV TUMOR/LES BY SNARE;  Surgeon: Helyn Numbers, MD;  Location: [facility]ED[facility] [place] [facility];  Service: Gastroenterology    PR EXC SKIN BENIG 2.1-3 CM TRUNK,ARM,LEG Right 02/25/2017    Procedure: EXCISION, BENIGN LESION INCLUDE MARGINS, EXCEPT SKIN TAG, LEGS; EXCISED DIAMETER 2.1 TO 3.0 CM;  Surgeon: Clarene Duke, MD;  Location: CHILDRENS OR [facility];  Service: Plastics    PR EXC SKIN BENIG 3.1-4 CM TRUNK,ARM,LEG Right 02/25/2017    Procedure: EXCISION, BENIGN LESION INCLUDE MARGINS, EXCEPT SKIN TAG, ARMS; EXCISED DIAMETER 3.1 TO 4.0 CM;  Surgeon: Clarene Duke, MD;  Location: CHILDRENS OR [place];  Service: Plastics    PR EXC SKIN BENIG >4 CM FACE,FACIAL Right 02/25/2017    Procedure: EXCISION, OTHER BENIGN LES INCLUD MARGIN, FACE/EARS/EYELIDS/NOSE/LIPS/MUCOUS MEMBRANE; EXCISED DIAM >4.0 CM;  Surgeon: Clarene Duke, MD;  Location: CHILDRENS OR [facility];  Service: Plastics    PR EXC TUMOR SOFT TISSUE LEG/ANKLE SUBQ 3+CM Right 08/05/2019    Procedure: EXCISION, TUMOR, SOFT TISSUE OF LEG OR ANKLE AREA, SUBCUTANEOUS; 3 CM OR GREATER;  Surgeon: Arsenio Katz, MD;  Location: MAIN OR [place];  Service: Plastics    PR EXC TUMOR SOFT TISSUE LEG/ANKLE SUBQ <3CM Right 08/05/2019    Procedure: EXCISION, TUMOR, SOFT TISSUE OF LEG OR ANKLE AREA, SUBCUTANEOUS; LESS THAN 3 CM;  Surgeon: Arsenio Katz, MD;  Location: MAIN OR [facility];  Service: Plastics    PR LAP, SURG PROCTECTOMY W J-POUCH N/A 08/10/2020    Procedure: ROBOTIC ASSISTED LAPAROSCOPIC PROCTOCOLECTOMY, ILEAL J POUCH, WITH OSTOMY;  Surgeon: Mickle Asper, MD;  Location: OR [place];  Service: General Surgery    PR NDSC EVAL INTSTINAL POUCH DX W/COLLJ SPEC SPX N/A 01/23/2021    Procedure: ENDO EVAL SM INTEST POUCH; DX;  Surgeon: Modena Nunnery, MD;  Location: [facility] [place] [facility];  Service: Gastroenterology    PR NDSC EVAL INTSTINAL POUCH DX W/COLLJ SPEC SPX N/A 08/27/2021    Procedure: ENDO EVAL SM INTEST POUCH; DX;  Surgeon: Hunt Oris, MD;  Location: [facility]URES [facility];  Service: Gastroenterology    PR NDSC EVAL INTSTINAL POUCH DX W/COLLJ SPEC SPX N/A 12/09/2021    Procedure: ENDO EVAL SM INTEST POUCH; DX;  Surgeon: Vidal Schwalbe, MD;  Location: GI PROCEDURES [facility];  Service: Gastroenterology    PR NDSC EVAL INTSTINAL POUCH DX W/COLLJ [facility] SPX Left 04/09/2022    Procedure: ENDO EVAL SM INTEST POUCH; DX;  Surgeon: Modena Nunnery, MD;  Location: [facility] [place] [facility];  Service: Gastroenterology    PR NDSC EVAL INTSTINAL POUCH DX W/COLLJ SPEC SPX N/A 08/05/2022    Procedure: ENDO EVAL SM INTEST POUCH; DX;  Surgeon: Modena Nunnery, MD;  Location: GI PROCEDURES [facility];  Service: Gastroenterology    PR NDSC EVAL INTSTINAL POUCH DX W/COLLJ SPEC SPX N/A 03/13/2023    Procedure: ENDO EVAL SM INTEST POUCH; DX;  Surgeon: Carmon Ginsberg, MD;  Location: [facility]URES [facility];  Service: Gastroenterology    PR NDSC EVAL INTSTINAL POUCH W/BX SINGLE/MULTIPLE N/A 01/20/2022    Procedure: ENDOSCOPIC EVAL OF SMALL INTESTINAL POUCH; DIAGNOSTIC, No biopsies;  Surgeon: Andrey Farmer, MD;  [facility]: [facility]URES [facility];  Service: Gastroenterology    PR NDSC EVAL INTSTINAL POUCH W/BX SINGLE/MULTIPLE N/A 02/13/2022    Procedure: ENDOSCOPIC EVAL OF SMALL INTESTINAL POUCH; DIAGNOSTIC, WITH BIOPSY;  Surgeon: Bronson Curb, MD;  Location: GI PROCEDURES [facility];  Service: Gastroenterology PR NDSC EVAL INTSTINAL POUCH W/BX SINGLE/MULTIPLE N/A 03/13/2023    Procedure: ENDOSCOPIC EVAL OF SMALL INTESTINAL POUCH; DIAGNOSTIC, WITH BIOPSY;  Surgeon: Carmon Ginsberg, MD;  Location: GI PROCEDURES [facility];  Service: Gastroenterology    PR UNLISTED PROCEDURE SMALL INTESTINE  01/23/2021    Procedure: UNLISTED PROCEDURE, SMALL INTESTINE;  Surgeon: Modena Nunnery, MD;  Location: GI PROCEDURES [place] [facility];  Service: Gastroenterology    PR UNLISTED PROCEDURE SMALL INTESTINE  02/13/2022    Procedure: UNLISTED PROCEDURE, SMALL INTESTINE;  Surgeon: Bronson Curb, MD;  [facility]: GI PROCEDURES [facility];  Service: Gastroenterology    PR UPPER GI  ENDOSCOPY,BIOPSY N/A 10/27/2012    Procedure: UGI ENDOSCOPY; WITH BIOPSY, SINGLE OR MULTIPLE;  Surgeon: Shirlyn Goltz Mir, MD;  Location: PEDS PROCEDURE ROOM [facility];  Service: Gastroenterology    PR UPPER GI ENDOSCOPY,BIOPSY N/A 09/14/2013    Procedure: UGI ENDOSCOPY; WITH BIOPSY, SINGLE OR MULTIPLE;  Surgeon: Shirlyn Goltz Mir, MD;  Location: PEDS PROCEDURE ROOM [facility];  Service: Gastroenterology    PR UPPER GI ENDOSCOPY,BIOPSY N/A 11/08/2014    Procedure: UGI ENDOSCOPY; WITH BIOPSY, SINGLE OR MULTIPLE;  Surgeon: Arnold Long Mir, MD;  Location: PEDS PROCEDURE ROOM [facility];  Service: Gastroenterology    PR UPPER GI ENDOSCOPY,BIOPSY N/A 12/26/2015    Procedure: UGI ENDOSCOPY; WITH BIOPSY, SINGLE OR MULTIPLE;  Surgeon: Arnold Long Mir, MD;  Location: PEDS PROCEDURE ROOM [facility];  Service: Gastroenterology    PR UPPER GI ENDOSCOPY,BIOPSY N/A 08/27/2016    Procedure: UGI ENDOSCOPY; WITH BIOPSY, SINGLE OR MULTIPLE;  Surgeon: Arnold Long Mir, MD;  Location: PEDS PROCEDURE ROOM [facility];  Service: Gastroenterology    PR UPPER GI ENDOSCOPY,BIOPSY N/A 09/02/2017    Procedure: UGI ENDOSCOPY; WITH BIOPSY, SINGLE OR MULTIPLE;  Surgeon: Arnold Long Mir, MD;  Location: PEDS PROCEDURE ROOM [facility];  Service: Gastroenterology    PR UPPER GI ENDOSCOPY,BIOPSY N/A 03/13/2020    Procedure: UGI ENDOSCOPY; WITH BIOPSY, SINGLE OR MULTIPLE;  Surgeon: Helyn Numbers, MD;  [facility]: [facility] [place] [facility];  Service: Gastroenterology    PR UPPER GI ENDOSCOPY,BIOPSY N/A 09/05/2021    Procedure: UGI ENDOSCOPY; WITH BIOPSY, SINGLE OR MULTIPLE;  Surgeon: Wendall Papa, MD;  [facility]: GI PROC[facility]URES [facility];  Service: Gastroenterology    PR UPPER GI ENDOSCOPY,DIAGNOSIS N/A 01/20/2022    Procedure: UGI ENDO, INCLUDE ESOPHAGUS, STOMACH, & DUODENUM &/OR JEJUNUM; DX W/WO COLLECTION SPECIMN, BY BRUSH OR WASH;  Surgeon: Andrey Farmer, MD;  Location: [facility]URES [facility];  Service: Gastroenterology    TUMOR REMOVAL      multiple-head, neck, back, hand, right flank, multiple       ----------------------------------------------------------------    Plan/Follow-Up:    A recommendation was made for Clinical Massage at least 1x per every 2 weeks      Patient resting in bed post-session, call light/all needs within reach, fall precautions in place.        I am deeply honored to be a part of this patient's care. Thank you so very much for the opportunity to work with them!  Please reach out with any questions or concerns.   .................................................................................................................................................................................................................  There is no need to put in a new consult at this admission.   If this patient requests massage during this admission, you can send me a message on EPIC chat, or call me on vocera.   ..................................................................................................................................................................................................................  Please feel free to put in a new consult order for massage therapy at any subsequent admission. Thank you so much! ................................................................................................................................................................................................Marland Kitchen

## 2023-03-27 NOTE — Unmapped (Signed)
Vancomycin Therapeutic Monitoring Pharmacy Note    [name] [name] is a 23 y.o. female starting vancomycin. Date of therapy initiation: 03/27/23    Indication: Bacteremia/Sepsis    Prior Dosing Information: None/new initiation     Goals:  Therapeutic Drug Levels  Vancomycin trough goal: 10-15 mg/L    Additional Clinical Monitoring/Outcomes  Renal function, volume status (intake and output)    Results: Not applicable    Wt Readings from Last 1 Encounters:   03/25/23 51.3 kg (113 lb)     Creatinine   Date Value Ref Range Status   03/27/2023 0.62 0.55 - 1.02 mg/dL Final   28/41/3244 0.10 0.55 - 1.02 mg/dL Final   27/25/3664 4.03 0.55 - 1.02 mg/dL Final        Pharmacokinetic Considerations and Significant Drug Interactions:  Adult (estimated initial): Vd = 36.423 L, ke = 0.1375 hr-1  Concurrent nephrotoxic meds: not applicable    Assessment/Plan:  Recommendation(s)  Start vancomycin 750 mg IV Q8H  Estimated trough on recommended regimen:  11 mg/L    Follow-up  Level due: prior to fourth or fifth dose  A pharmacist will continue to monitor and order levels as appropriate    Please page service pharmacist with questions/clarifications.    [name] [name] Start, PharmD

## 2023-03-27 NOTE — Unmapped (Signed)
Stopped by for a visit with [name] [name].    Voiced support for her frustrations with everything that is going on.     A couple of key themes from the conversation:  -She feels let down / abandoned by her outpatient care teams. She has thoughts about no longer seeking care given how she has not felt supported and has such a hard time getting the care she feels she needs.  -She emphasizes that she knows what she needs, she waits a long time before seeking care, her quality of life is quite poor and disrupted by pain, diarrhea, bleeding, nausea, malnutrition. She has lived with these ailments for decades and knows her body. She struggles to understand why there is such resistance to doing what she feels she needs.  -She does not understand why things have changed so dramatically. She and her mother feel that if she already had a line in place, she would have been started on TPN already. They perceive that the threshold for TPN, and potentially other interventions, has changed.  -Her perceptions of the conversations about anxiety / mental health are that the medical teams are saying it is all in her head, she is doing this to herself, etc. She rejects that forcefully, as does her mother.    I shared support and sympathy for her current frustrations. I also made several points:  -Her medical teams are not abandoning her. It may feel that way, but my view is that her medical teams (myself included) are grappling with ways that the health care we have provided has not actually helped her, while potentially putting her at real risk.  -Some of the medical decisions are not up to her. She may dislike what we have to say, and she is entitled to that frustration or disagreement. But we have to balance risks and harms and may choose not to offer care that we think is inappropriate.  -Mental health is a central piece of this. I do not believe it is the only piece - she has real and complex GI pathology, intra-abdominal tumors, a cancer predisposition genetic syndrome, a history of real and severe medical complications. But in the same way that we would not ignore her desmoid tumors in her overall picture (nor would we attribute everything to them), we cannot ignore her mental health and expect things to get better.    I recommend holding her nirogacestat for the time being as we work on her nausea. I will discuss more with her primary and consult teams, I would consider an inpatient psychiatry consultation depending on her trajectory.    She and her mother were frustrated by the overall picture but appreciative for the discussion of the nuanced balance of risks and benefits. Her mother states: I see both sides of it. Both sides scare me, referring to additional medical care (risks of line infection, clotting, etc), and the prospect of doing nothing or relying on oral nutrition alone.

## 2023-03-27 NOTE — Unmapped (Signed)
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 As per patient and mom, normally reacts to vancomycin and has had to be medicated with benadryl along with vancomycin infusion in the past. MD made aware.IV benadryl given as per order.

## 2023-03-27 NOTE — Unmapped (Signed)
PHYSICAL THERAPY  Evaluation (03/27/23 1328)          Patient Name:  [name] [name]       Medical Record Number: [id number]   Date of Birth: 09-Dec-2000  Sex: Female        Post-Discharge Physical Therapy Recommendations:  PT Post Acute Discharge Recommendations: Skilled PT services indicated, 5x weekly, High intensity   Equipment Recommendation  PT DME Recommendations: None          Treatment Diagnosis: Difficulty in walking, Generalized muscle weakness, Unsteadiness on feet        Activity Tolerance: Limited by fatigue, Limited secondary to medical complications     ASSESSMENT  Problem List: Decreased strength, Impaired balance, Pain, Decreased endurance, Decreased mobility, Fall risk      Assessment : 35 F hx desmoid fibromatosis, recent admission for pouchitis, p/w continued nausea, abdominal pain, and mild BRBPR, requesting initiation of TPN. Pt presents to PT eval with generalized weakness, decreased activity tolerance, impaired dynamic balance, and poor endurance. These impairments are limiting her ability to perform household mobility modified independently at her functional baseline, and she is currently requiring minAx1 for transfers and short ambulation distances. As such, she is not able to ambulate household distances or perform stair mobility, which is necessary for the pt to return home. Pt will greatly benefit from 5xH for post acute PT needs once medically cleared for dc. Will continue to follow and progress while pt remains in house.      Today's Interventions: Balance activities, Endurance activities, Gait training, Patient/Family/Caregiver Education, Therapeutic activity  Today's Interventions: PT eval, gait training, transfer training, bed mobility, and education re: PT dc recs, PT role and POC         Examination of Body System: Musculoskeletal, Cardiovascular, Pulmonary, Activity/participation  Clinical Presentation: Evolving    Clinical Decision Making: Moderate        PLAN  Planned Frequency of Treatment: Plan of Care Initiated: 03/27/23  1-2x per day Weekly Frequency: 3-4 days per week  Planned Treatment Duration: 04/10/23     Planned Interventions: Education (Patient/Family/Caregiver), Gait training, Home exercise program, Neuromuscular re-education, Self-care / Home Management training, Therapeutic Exercise, Therapeutic Activity     Goals:   Patient and Family Goals: to be able to ambulate with no AD     SHORT GOAL #1: Pt will be able to perform bed mobility independently               Time Frame : 2 weeks  SHORT GOAL #2: Pt will be able to perform functional transfers modified independently with LRAD              Time Frame : 2 weeks  SHORT GOAL #3: Pt will be able to ambulate 539ft with LRAD modified independently              Time Frame : 2 weeks  SHORT GOAL #4: Pt will be able to negotiate 4 stairs with LRAD modified independently               Time Frame : 2 weeks                        Long Term Goal #1: Pt will be able to score 24/24 on AMPC  Time Frame: 4 weeks     Prognosis:  Good  Positive Indicators: CLOF, age, motivation, family support  Barriers to Discharge: Endurance deficits     SUBJECTIVE  Communication  Preference: Verbal,     Patient reports: I just feel so weak, I can barely get up the stairs at home any more and I fall all the time.     Services patient receives prior to admission: PT (Outpatient PT 2x/wk)  Prior Functional Status: Pt reports recently she has been using rollator for household ambulation, WC for community distances. Has had 2 recent falls while ambulating. She was supposed to receive home PT services, however pt did not receive home services and her mother was driving her to outpatient PT appointments. She also reports being so weak recently that she has had to scoot up and down the stairs.  Equipment available at home: Rollator, Rolling walker, [employer]        Past Medical History:   Diagnosis Date    Abdominal pain     Acid reflux     occas Anesthesia complication     itching, shaking, coldness; last few surgeries have gone much better    Cancer (CMS-HCC)     Cataract of right eye     COVID-19 virus infection 01/2019    Cyst of thyroid determined by ultrasound     monitoring    [name]     2 right forearm, 1 left thigh, 1 right scapula, 1 under left clavicle; multiple    Difficult intravenous access     [name] (familial adenomatous polyposis)     [name]     Gastric polyps     History of chemotherapy     last treatment approx 05/2019    History of colon polyps     History of COVID-19 01/2019    Ileus (CMS-HCC) 03/16/2022    Iron deficiency anemia due to chronic blood loss     received iron infusion 11-2019    PONV (postoperative nausea and vomiting)     Rectal bleeding     Syncopal episodes     especially if becoming dehydrated            Social History     Tobacco Use    Smoking status: Never     Passive exposure: Past    Smokeless tobacco: Never   Substance Use Topics    Alcohol use: Never       Past Surgical History:   Procedure Laterality Date    COLON SURGERY      cyroablation      cystis removal      desmoid removal      PR CLOSE ENTEROSTOMY,RESEC+ANAST N/A 10/09/2020    Procedure: ILEOSTOMY TAKEDOWN;  Surgeon: Mickle Asper, MD;  Location: OR ;  Service: General Surgery    PR COLONOSCOPY W/BIOPSY SINGLE/MULTIPLE N/A 10/27/2012    Procedure: COLONOSCOPY, FLEXIBLE, PROXIMAL TO SPLENIC FLEXURE; WITH BIOPSY, SINGLE OR MULTIPLE;  Surgeon: Shirlyn Goltz Mir, MD;  Location: PEDS PROCEDURE ROOM [place];  Service: Gastroenterology    PR COLONOSCOPY W/BIOPSY SINGLE/MULTIPLE N/A 09/14/2013    Procedure: COLONOSCOPY, FLEXIBLE, PROXIMAL TO SPLENIC FLEXURE; WITH BIOPSY, SINGLE OR MULTIPLE;  Surgeon: Shirlyn Goltz Mir, MD;  Location: PEDS PROCEDURE ROOM [place];  Service: Gastroenterology    PR COLONOSCOPY W/BIOPSY SINGLE/MULTIPLE N/A 11/08/2014    Procedure: COLONOSCOPY, FLEXIBLE, PROXIMAL TO SPLENIC FLEXURE; WITH BIOPSY, SINGLE OR MULTIPLE; Surgeon: Arnold Long Mir, MD;  Location: PEDS PROCEDURE ROOM [facility];  Service: Gastroenterology    PR COLONOSCOPY W/BIOPSY SINGLE/MULTIPLE N/A 12/26/2015    Procedure: COLONOSCOPY, FLEXIBLE, PROXIMAL TO SPLENIC FLEXURE; WITH BIOPSY, SINGLE OR MULTIPLE;  Surgeon: Arnold Long Mir, MD;  Location: PEDS PROCEDURE ROOM [facility];  Service: Gastroenterology    PR COLONOSCOPY W/BIOPSY SINGLE/MULTIPLE N/A 09/02/2017    Procedure: COLONOSCOPY, FLEXIBLE, PROXIMAL TO SPLENIC FLEXURE; WITH BIOPSY, SINGLE OR MULTIPLE;  Surgeon: Arnold Long Mir, MD;  Location: PEDS PROCEDURE ROOM [place];  Service: Gastroenterology    PR COLSC FLX W/REMOVAL LESION BY HOT BX FORCEPS N/A 08/27/2016    Procedure: COLONOSCOPY, FLEXIBLE, PROXIMAL TO SPLENIC FLEXURE; W/REMOVAL TUMOR/POLYP/OTHER LESION, HOT BX FORCEP/CAUTE;  Surgeon: Arnold Long Mir, MD;  Location: PEDS PROCEDURE ROOM [facility];  Service: Gastroenterology    PR COLSC FLX W/RMVL OF TUMOR POLYP LESION SNARE TQ N/A 02/25/2019    Procedure: COLONOSCOPY FLEX; W/REMOV TUMOR/LES BY SNARE;  Surgeon: Helyn Numbers, MD;  [facility] [facility];  Service: Gastroenterology    PR COLSC FLX W/RMVL OF TUMOR POLYP LESION SNARE TQ N/A 03/13/2020    Procedure: COLONOSCOPY FLEX; W/REMOV TUMOR/LES BY SNARE;  Surgeon: Helyn Numbers, MD;  [facility];  Service: Gastroenterology    PR EXC SKIN BENIG 2.1-3 CM TRUNK,ARM,LEG Right 02/25/2017    Procedure: EXCISION, BENIGN LESION INCLUDE MARGINS, EXCEPT SKIN TAG, LEGS; EXCISED DIAMETER 2.1 TO 3.0 CM;  Surgeon: Clarene Duke, MD;  Location: CHILDRENS OR [facility];  Service: Plastics    PR EXC SKIN BENIG 3.1-4 CM TRUNK,ARM,LEG Right 02/25/2017    Procedure: EXCISION, BENIGN LESION INCLUDE MARGINS, EXCEPT SKIN TAG, ARMS; EXCISED DIAMETER 3.1 TO 4.0 CM;  Surgeon: Clarene Duke, MD;  Location: CHILDRENS OR [facility];  Service: Plastics    PR EXC SKIN BENIG >4 CM FACE,FACIAL Right 02/25/2017    Procedure: EXCISION, OTHER BENIGN LES INCLUD MARGIN, FACE/EARS/EYELIDS/NOSE/LIPS/MUCOUS MEMBRANE; EXCISED DIAM >4.0 CM;  Surgeon: Clarene Duke, MD;  Location: CHILDRENS OR [facility];  Service: Plastics    PR EXC TUMOR SOFT TISSUE LEG/ANKLE SUBQ 3+CM Right 08/05/2019    Procedure: EXCISION, TUMOR, SOFT TISSUE OF LEG OR ANKLE AREA, SUBCUTANEOUS; 3 CM OR GREATER;  Surgeon: Arsenio Katz, MD;  Location: MAIN OR [place];  Service: Plastics    PR EXC TUMOR SOFT TISSUE LEG/ANKLE SUBQ <3CM Right 08/05/2019    Procedure: EXCISION, TUMOR, SOFT TISSUE OF LEG OR ANKLE AREA, SUBCUTANEOUS; LESS THAN 3 CM;  Surgeon: Arsenio Katz, MD;  Location: MAIN OR [facility];  Service: Plastics    PR LAP, SURG PROCTECTOMY W J-POUCH N/A 08/10/2020    Procedure: ROBOTIC ASSISTED LAPAROSCOPIC PROCTOCOLECTOMY, ILEAL J POUCH, WITH OSTOMY;  Surgeon: Mickle Asper, MD;  Location: OR [place];  Service: General Surgery    PR NDSC EVAL INTSTINAL POUCH DX W/COLLJ SPEC SPX N/A 01/23/2021    Procedure: ENDO EVAL SM INTEST POUCH; DX;  Surgeon: Modena Nunnery, MD;  [facility]: [facility];  Service: Gastroenterology    PR NDSC EVAL INTSTINAL POUCH DX W/COLLJ SPEC SPX N/A 08/27/2021    Procedure: ENDO EVAL SM INTEST POUCH; DX;  Surgeon: Hunt Oris, MD;  Location: [facility]URES [facility];  Service: Gastroenterology    PR NDSC EVAL INTSTINAL POUCH DX W/COLLJ SPEC SPX N/A 12/09/2021    Procedure: ENDO EVAL SM INTEST POUCH; DX;  Surgeon: Vidal Schwalbe, MD;  Location: [facility]URES [facility];  Service: Gastroenterology    PR NDSC EVAL INTSTINAL POUCH DX W/COLLJ [facility] SPX Left 04/09/2022    Procedure: ENDO EVAL SM INTEST POUCH; DX;  Surgeon: Modena Nunnery, MD;  Location: [facility];  Service: Gastroenterology    PR NDSC EVAL INTSTINAL POUCH DX W/COLLJ SPEC SPX  N/A 08/05/2022    Procedure: ENDO EVAL SM INTEST POUCH; DX;  Surgeon: Modena Nunnery, MD;  Location: [facility] PROCEDURES [facility];  Service: Gastroenterology    PR NDSC EVAL INTSTINAL POUCH DX W/COLLJ SPEC SPX N/A 03/13/2023    Procedure: ENDO EVAL SM INTEST POUCH; DX;  Surgeon: Carmon Ginsberg, MD;  Location: [facility]URES [facility];  Service: Gastroenterology    PR NDSC EVAL INTSTINAL POUCH W/BX SINGLE/MULTIPLE N/A 01/20/2022    Procedure: ENDOSCOPIC EVAL OF SMALL INTESTINAL POUCH; DIAGNOSTIC, No biopsies;  Surgeon: Andrey Farmer, MD;  Location: [facility] PROCEDURES [facility];  Service: Gastroenterology    PR NDSC EVAL INTSTINAL POUCH W/BX SINGLE/MULTIPLE N/A 02/13/2022    Procedure: ENDOSCOPIC EVAL OF SMALL INTESTINAL POUCH; DIAGNOSTIC, WITH BIOPSY;  Surgeon: Bronson Curb, MD;  Location: [facility] PROCEDURES [facility];  Service: Gastroenterology    PR NDSC EVAL INTSTINAL POUCH W/BX SINGLE/MULTIPLE N/A 03/13/2023    Procedure: ENDOSCOPIC EVAL OF SMALL INTESTINAL POUCH; DIAGNOSTIC, WITH BIOPSY;  Surgeon: Carmon Ginsberg, MD;  Location: [facility] PROCEDURES [facility];  Service: Gastroenterology    PR UNLISTED PROCEDURE SMALL INTESTINE  01/23/2021    Procedure: UNLISTED PROCEDURE, SMALL INTESTINE;  Surgeon: Modena Nunnery, MD;  Location: [facility] [facility];  Service: Gastroenterology    PR UNLISTED PROCEDURE SMALL INTESTINE  02/13/2022    Procedure: UNLISTED PROCEDURE, SMALL INTESTINE;  Surgeon: Bronson Curb, MD;  Location: GI PROCEDURES [facility];  Service: Gastroenterology    PR UPPER GI ENDOSCOPY,BIOPSY N/A 10/27/2012    Procedure: UGI ENDOSCOPY; WITH BIOPSY, SINGLE OR MULTIPLE;  Surgeon: Shirlyn Goltz Mir, MD;  Location: PEDS PROCEDURE ROOM [facility];  Service: Gastroenterology    PR UPPER GI ENDOSCOPY,BIOPSY N/A 09/14/2013    Procedure: UGI ENDOSCOPY; WITH BIOPSY, SINGLE OR MULTIPLE;  Surgeon: Shirlyn Goltz Mir, MD;  Location: PEDS PROCEDURE ROOM [facility];  Service: Gastroenterology    PR UPPER GI ENDOSCOPY,BIOPSY N/A 11/08/2014    Procedure: UGI ENDOSCOPY; WITH BIOPSY, SINGLE OR MULTIPLE;  Surgeon: Arnold Long Mir, MD; Location: PEDS PROCEDURE ROOM [facility];  Service: Gastroenterology    PR UPPER GI ENDOSCOPY,BIOPSY N/A 12/26/2015    Procedure: UGI ENDOSCOPY; WITH BIOPSY, SINGLE OR MULTIPLE;  Surgeon: Arnold Long Mir, MD;  Location: PEDS PROCEDURE ROOM [facility];  Service: Gastroenterology    PR UPPER GI ENDOSCOPY,BIOPSY N/A 08/27/2016    Procedure: UGI ENDOSCOPY; WITH BIOPSY, SINGLE OR MULTIPLE;  Surgeon: Arnold Long Mir, MD;  Location: PEDS PROCEDURE ROOM [facility];  Service: Gastroenterology    PR UPPER GI ENDOSCOPY,BIOPSY N/A 09/02/2017    Procedure: UGI ENDOSCOPY; WITH BIOPSY, SINGLE OR MULTIPLE;  Surgeon: Arnold Long Mir, MD;  Location: PEDS PROCEDURE ROOM [facility];  Service: Gastroenterology    PR UPPER GI ENDOSCOPY,BIOPSY N/A 03/13/2020    Procedure: UGI ENDOSCOPY; WITH BIOPSY, SINGLE OR MULTIPLE;  Surgeon: Helyn Numbers, MD;  Location: [facility] [place] [facility];  Service: Gastroenterology    PR UPPER GI ENDOSCOPY,BIOPSY N/A 09/05/2021    Procedure: UGI ENDOSCOPY; WITH BIOPSY, SINGLE OR MULTIPLE;  Surgeon: Wendall Papa, MD;  [facility]: [facility] PROC[facility]URES [facility];  Service: Gastroenterology    PR UPPER GI ENDOSCOPY,DIAGNOSIS N/A 01/20/2022    Procedure: UGI ENDO, INCLUDE ESOPHAGUS, STOMACH, & DUODENUM &/OR JEJUNUM; DX W/WO COLLECTION SPECIMN, BY BRUSH OR WASH;  Surgeon: Andrey Farmer, MD;  Location: [facility]URES [facility];  Service: Gastroenterology    TUMOR REMOVAL      multiple-head, neck, back, hand, right flank, multiple  Family History   Problem Relation Age of Onset    No Known Problems Mother     No Known Problems Father     No Known Problems Sister     No Known Problems Brother     Stroke Maternal Grandmother     Other Maternal Grandmother         benign lesions of liver and pancreas, further details unknown    Cancer Maternal Grandmother     Diabetes Maternal Grandmother     Hypertension Maternal Grandmother     Thyroid disease Maternal Grandmother     Arthritis Maternal Grandfather     Asthma Maternal Grandfather     COPD Paternal Grandmother         Deceased    Miscarriages / Stillbirths Paternal Grandmother     Alcohol abuse Paternal Grandfather         Deceased    No Known Problems Maternal Aunt     No Known Problems Maternal Uncle     No Known Problems Paternal Aunt     No Known Problems Paternal Uncle     Anesthesia problems Neg Hx     Broken bones Neg Hx     Cancer Neg Hx     Clotting disorder Neg Hx     Collagen disease Neg Hx     Diabetes Neg Hx     Dislocations Neg Hx     Fibromyalgia Neg Hx     Gout Neg Hx     Hemophilia Neg Hx     Osteoporosis Neg Hx     Rheumatologic disease Neg Hx     Scoliosis Neg Hx     Severe sprains Neg Hx     Sickle cell anemia Neg Hx     Spinal Compression Fracture Neg Hx     Melanoma Neg Hx     Basal cell carcinoma Neg Hx     Squamous cell carcinoma Neg Hx         Allergies: Adhesive tape-silicones; Ferrlecit [sodium ferric gluconat-sucrose]; Levofloxacin; Methylnaltrexone; Neomycin; Papaya; Morphine; Zosyn [piperacillin-tazobactam]; Compazine [prochlorperazine]; Iron analogues; Reglan [metoclopramide hcl]; Iron dextran; and Latex, natural rubber                  Objective Findings  Precautions / Restrictions  Precautions: Falls precautions  Weight Bearing Status: Non-applicable  Required Braces or Orthoses: Non-applicable     Pain Comments: 7/10 abdominal pain     Equipment / Environment: Vascular access (PIV, TLC, Port-a-cath, PICC), Telemetry     Vitals/Orthostatics : VSS: Supine: 116/77, HR 93, SPO2 98% on RA; Sitting at EOB: 119/79, HR 115; Sitting post ambulation: 114/79, HR 94, SpO2 99% on RA     Living Situation  Living Environment: House  Lives With: Mother, Father  Home Living: Multi-level home, Bed/bath upstairs, 1/2 bath on main level, Stairs to alternate level with rails, Stairs to enter without rails, Stairs to alternate level without rails, Handicapped height toilet, Tub/shower unit, Able to Live on main level with bedroom/bathroom  Number of Stairs to Enter (outside): 3  Rail placement (inside): Rail on right side  Number of Stairs to Alternate level (inside): 14  Caregiver Identified?: Yes  Caregiver Availability: 24 hours  Caregiver Ability: Limited lifting      Cognition: Follows 1-step commands, WFL  Orientation: Oriented x4  Visual/Perception: Within Functional Limits  Hearing: No deficit identified     Skin Inspection: Intact where visualized     Upper Extremities  UE  comment: Limited right UE ROM/Strength due to tumors and pain, left UE WFL    Lower Extremities  LE ROM: Right Impaired/Limited, Left Impaired/Limited  RLE ROM Impairment: Limited AROM, Pain with movement  LLE ROM Impairment: Limited AROM, Pain with movement  LE Strength: Right Impaired/Limited, Left Impaired/Limited  RLE Strength Impairment: Reduced strength  LLE Strength Impairment: Reduced strength  LE comment: Bilateral hip flexion 2/5 strength, knee flexion/extension 4/5, and bilateral ankles 3+/5. Hip ROM limited by tumors and pain          Coordination: WFL  Proprioception: Not tested  Sensation: Numbness  Motor/Sensory/Neuro Comments: bilateral feet numbness    Static Sitting-Level of Assistance: Supervision  Dynamic Sitting-Level of Assistance: Supervision  Sitting Balance comments: SBA for sitting balance    Static Standing-Level of Assistance: Minimum assistance  Dynamic Standing - Level of Assistance: Minimum assistance  Standing Balance comments: MinAx1 with RW      Supine to Sit assistance level: Standby assist, set-up cues, supervision of patient - no hands on  Bed Mobility comments: SBA with supine to sit, HOB was elevated     Transfers: Sit to Stand  Sit to Stand assistance level: Minimal assist, patient does 75% or more  Transfer comments: MinAx1 with cueing for hand placement and RW management      Gait Level of Assistance: Minimal assist, patient does 75% or more  Gait Assistive Device: Rolling walker  Gait Distance Ambulated (ft): 50 ft  Skilled Treatment Performed: Pt was able to ambulate 29ft with RW and minAx1. She requires assistance for unsteady gait and demos decreased gait speed, decreased bilateral step length, increased lateral trunk flexion and reports increased dizziness requiring seated rest break after ambulating 74ft. Further mobility was deferred.     Stairs: NT      Wheelchair Mobility: N/a     Endurance: fair    Patient at end of session: All needs in reach, In bed, Friends/Family present, Lines intact, Nurse notified    Physical Therapy Session Duration  PT Individual [mins]: 25  Reason for Co-treatment: To safely progress mobility, Poor pain control          AM-PAC-6 click  Help currently need turning over In bed?: A Little - Minimal/Contact Guard Assist/Supervision  Help currently needed sitting down/standing up from chair with arms? : A Little - Minimal/Contact Guard Assist/Supervision  Help currently needed moving from supine to sitting on edge of bed?: A Little - Minimal/Contact Guard Assist/Supervision  Help currently needed moving to and from bed from wheelchair?: A Little - Minimal/Contact Guard Assist/Supervision  Help currently needed walking in a hospital room?: A Little - Minimal/Contact Guard Assist/Supervision  Help currently needed climbing 3-5 steps with railing?: A lot - Maximum/Moderate Assistance    Basic Mobility Score 6 click: 17    6 click Score (in points): % of Functional Impairment, Limitation, Restriction  6: 100% impaired, limited, restricted  7-8: At least 80%, but less than 100% impaired, limited restricted  9-13: At least 60%, but less than 80% impaired, limited restricted  14-19: At least 40%, but less than 60% impaired, limited restricted  20-22: At least 20%, but less than 40% impaired, limited restricted  23: At least 1%, but less than 20% impaired, limited restricted  24: 0% impaired, limited restricted        I attest that I have reviewed the above information.  Signed: Loel Ro, PT  Filed 03/27/2023

## 2023-03-27 NOTE — Unmapped (Signed)
=  Department of Anesthesiology  Pain Medicine Division    Chronic Pain Consult Note      Requesting Attending Physician:  Kateri Plummer, MD  Service Requesting Consult:  Med [facility])    Assessment/Recommendations:  The patient was seen in consultation on request of Kateri Plummer, MD regarding assistance with pain management. The patient is not obtaining adequate pain relief on current medication regimen.     [name] [name] is a 23 year old female with a complex medical history, including DVT (RUE), [name] syndrome (post-colectomy), desmoid tumors, anemia, and severe protein-calorie malnutrition. She is followed at [facility] for chronic abdominal and right shoulder pain, consistent with cancer-associated pain. 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 Alishia???s pain has worsened in recent months, likely due to intermittent intestinal obstructions related to her mesenteric desmoid tumors, which may cause intermittent bowel intussusception. Her pain is further complicated by narcotic bowel syndrome, visceral hyperalgesia, and a positive feedback loop where increased opioid use exacerbates the pain. Keionna???s previous hospitalization was complicated by ileus, requiring NG tube, a prolonged inpatient rehab stay.  During her most recent hospitalization she was found to have some pouchitis and was continued on oral antibiotic.     Yesterday, she presented for an outpatient iron infusion but experienced a flare-up of her chronic abdominal pain, and significant nausea nausea. She was admitted to the [facility] emergency department for intractable acute on chronic pain, significant nausea, some hematochezia, dehydration.    Emergency department had contacted [name] on the evening of 03/25/2023 who had provided general recommendation for treatment plan.  Please review his note dated 03/25/2023.    I contacted Michalla's primary pain provider, [name], who recommended the following treatment plan:  Continue Butrans patch at 20 mcg/h.  Increase oxycodone liquid to 15-20 mg every 4 hours as needed  Continue HM IV 1 mg q4 hrs.  Avoid ketamine infusion at this time.  Aggressively address nausea symptoms to be able to take PO meds    This plan was discussed with [name], who agreed to the recommendations.      Recommendations:  -The chronic pain service is a consult service and does not place orders, just makes recommendations (except ketamine and lidocaine infusions)  -Please evaluate all patients on opioids for appropriateness of prescribing narcan at discharge.  The chronic pain service can assist with this.  Nasal narcan covered by most insurance.  -Recommendations given apply to the current hospitalization and do not reflect long term recommendations.     Recommended to   -Continue Butrans patch 20 mcg/h q. Weekly  -Aggressively address nausea symptoms per primary team choice  -Continue oxycodone liquid 20 mg every 4 hours as needed, please encourage patient to utilize this modality, unless she has intractable vomiting despite antiemetic  -Continue hydromorphone 1 mg IV every 4 hours as needed.  -Continue Tylenol 1 g 3 times daily    We will continue to follow.    Please prescribe naloxone at discharge.  Nasal narcan for most insured (Nasal narcan 4mg /actuation, prescribe 1 kit, instructions at [email or web address]).  For uninsured, chronic pain can work to assist in finding an option.  OTC nasal narcan now available at most pharmacies for around $45.    History of Present Illness:    Reason for Consult: Acute on chronic abdominal pain, chronic pain syndrome    Patient describes the pain  especially diffusely in the lower abdomen just like her prior episodes.  Her primary concern is nausea without much vomiting and some rectal bleeding.  Per her nausea is making her unable to take her p.o. meds that led to exacerbation of her chronic abdominal pain.  Patient reports unable to eat and drink much nutritious food in last 3 to 4 days leading up to admission.  She reports about 5 to 6 pounds of weight loss due to this in last 1 week.  She has continued to take her oral antibiotic as prescribed during last admission      Pain score range 7-8 out of 10  Prior to admission, the patient was on home opiate medications.  Shehas been followed by a pain clinic.  The [place] CSRS was reviewed.    Allergies    Allergies   Allergen Reactions    Adhesive Tape-Silicones Hives and Rash     Paper tape and Tegederm ok. Biopatch causes blistering but has tolerated CHG Tegaderm    Ferrlecit [Sodium Ferric Gluconat-Sucrose] Swelling and Rash    Levofloxacin Swelling and Rash     Swelling in mouth, rash,     Methylnaltrexone      Per Patient: I lost bowel control, severe abdominal cramping, and elevated BP    Neomycin Swelling     Rxn after ear drops; ear swelling    Papaya Hives    Morphine Nausea And Vomiting    Zosyn [Piperacillin-Tazobactam] Itching and Rash     Red and itchy    Compazine [Prochlorperazine] Other (See Comments)     Extreme agitation    Iron Analogues     Reglan [Metoclopramide Hcl] Diarrhea    Iron Dextran Itching     Received iron dextran 06/08/22 over 12 hours, had itching and redness/flushing during the infusion and for a couple days after. Required IV benadryl w/flares between doses and ultimately treated w/IV methylpred for 2 days.     Latex, Natural Rubber Rash       Home Medications    (Not in a hospital admission)      Inpatient Medications  Current Facility-Administered Medications   Medication Dose Route Frequency Provider Last Rate Last Admin    acetaminophen (TYLENOL) tablet 1,000 mg  1,000 mg Oral Q8H Gabriel Earing, MD        buprenorphine 20 mcg/hour transdermal patch 1 patch  1 patch Transdermal Weekly Gabriel Earing, MD   1 patch at 03/26/23 0027    cefdinir (OMNICEF) capsule 300 mg  300 mg Oral Q12H Suzzanne Cloud, MD        Followed by    Melene Muller ON 04/08/2023] cefdinir (OMNICEF) capsule 300 mg  300 mg Oral Daily Suzzanne Cloud, MD        cetirizine (ZYRTEC) tablet 20 mg  20 mg Oral BID Suzzanne Cloud, MD        cholecalciferol (vitamin D3 25 mcg (1,000 units)) tablet 25 mcg  25 mcg Oral Daily Suzzanne Cloud, MD        escitalopram oxalate (LEXAPRO) tablet 5 mg  5 mg Oral Daily Suzzanne Cloud, MD        famotidine (PEPCID) tablet 20 mg  20 mg Oral Nightly Suzzanne Cloud, MD        fluticasone propionate (FLONASE) 50 mcg/actuation nasal spray 1 spray  1 spray Topical Weekly Gabriel Earing, MD   1 spray at 03/26/23 0027    hydrocortisone 2.5 % cream   Topical BID PRN Gabriel Earing, MD  HYDROmorphone (PF) (DILAUDID) injection 1 mg  1 mg Intravenous Q4H PRN Kateri Plummer, MD   1 mg at 03/26/23 1857    IP OKAY TO TREAT   Other Continuous PRN Moschos, Stergios, MD        IP OKAY TO TREAT   Other Continuous PRN Bhatta, Manasa R, MD        IP OKAY TO TREAT   Other Continuous PRN Moschos, Stergios, MD        lactated Ringers infusion  75 mL/hr Intravenous Continuous Durene Romans, MD 75 mL/hr at 03/26/23 1428 75 mL/hr at 03/26/23 1428    lidocaine (ASPERCREME) 4 % 1 patch  1 patch Transdermal Daily Gabriel Earing, MD   1 patch at 03/26/23 0943    LORazepam (ATIVAN) tablet 1 mg  1 mg Oral BID PRN Suzzanne Cloud, MD        melatonin tablet 3 mg  3 mg Oral Nightly PRN Kateri Plummer, MD        naloxegol (MOVANTIK) 12.5 mg tablet 12.5 mg  12.5 mg Oral Daily PRN Gabriel Earing, MD        nirogacestat (OGSIVEO) tablet 150 mg **PATIENT SUPPLIED**  150 mg Oral BID Moschos, Stergios, MD        ondansetron (ZOFRAN) injection 8 mg  8 mg Intravenous Q8H Kateri Plummer, MD   8 mg at 03/26/23 1428    oxyCODONE (ROXICODONE) 5 mg/5 mL solution 20 mg  20 mg Oral Q4H PRN Kateri Plummer, MD   20 mg at 03/26/23 2058    pantoprazole (Protonix) EC tablet 40 mg  40 mg Oral Daily before breakfast Suzzanne Cloud, MD promethazine (PHENERGAN) 12.5 mg in sodium chloride (NS) 0.9 % 50 mL IVPB  12.5 mg Intravenous Q6H PRN Kateri Plummer, MD   Stopped at 03/26/23 2033    scopolamine (TRANSDERM-SCOP) 1 mg over 3 days topical patch 1 mg  1 patch Topical Q72H Gabriel Earing, MD   1 mg at 03/26/23 8295     Current Outpatient Medications   Medication Sig Dispense Refill    acetaminophen (TYLENOL) 500 MG tablet Take 2 tablets (1,000 mg total) by mouth every eight (8) hours.      buprenorphine (BUTRANS) 20 mcg/hour PTWK transdermal patch Place 1 patch on the skin every seven (7) days. 4 patch 1    cefdinir (OMNICEF) 300 MG capsule Take 1 capsule (300 mg total) by mouth every twelve (12) hours for 19 days, THEN 1 capsule (300 mg total) daily for 28 days. 66 capsule 0    COURIERED MED OR SUPPLY OGSVEO 50 MG TABLET. 1 each 0    escitalopram oxalate (LEXAPRO) 5 MG tablet Take 1 tablet (5 mg total) by mouth daily. 30 tablet 0    famotidine (PEPCID) 20 MG tablet Take 1 tablet (20 mg total) by mouth nightly. 30 tablet 3    fluticasone propionate (FLONASE) 50 mcg/actuation nasal spray Use 1 spray under the butrans patch once every 7 days to prevent allergic reaction to the adhesive      hydrocortisone 2.5 % cream Apply topically two (2) times a day as needed (hemorrhoids). 30 g 0    lidocaine (ASPERCREME) 4 % patch Place 1 patch on the skin daily as needed.      LORazepam (ATIVAN) 1 MG tablet Take 1 tablet (1 mg total) by mouth two (2) times a day as needed for anxiety. 60 tablet 0    naloxegol (MOVANTIK) 12.5 mg tablet  Take 1 tablet (12.5 mg total) by mouth daily. (Patient taking differently: Take 1 tablet (12.5 mg total) by mouth daily as needed.) 30 tablet 0    naloxone (NARCAN) 4 mg nasal spray One spray in either nostril once for known/suspected opioid overdose. May repeat every 2-3 minutes in alternating nostril til EMS arrives 2 each 0    nirogacestat (OGSIVEO) 50 mg tablet Take 3 tablets (150 mg total) by mouth two (2) times a day. Swallow tablets whole; do not break, crush, or chew. 180 tablet 5    ondansetron (ZOFRAN-ODT) 8 MG disintegrating tablet Dissolve 1 tablet (8 mg total) in the mouth every eight (8) hours as needed for nausea. 90 tablet 0    oxyCODONE (ROXICODONE) 5 mg/5 mL solution Take 20 mL (20 mg total) by mouth every four (4) hours as needed for up to 7 days. 840 mL 0    pantoprazole (PROTONIX) 40 MG tablet Take 1 tablet (40 mg total) by mouth daily before breakfast. 30 tablet 0    promethazine (PHENERGAN) 12.5 MG tablet Take 1 tablet (12.5 mg total) by mouth every twelve (12) hours as needed for nausea. 14 tablet 0    scopolamine (TRANSDERM-SCOP) 1 mg over 3 days Place 1 patch (1 mg total) on the skin every third day. 10 patch 2    sucralfate (CARAFATE) 100 mg/mL suspension Take 10 mL (1 g total) by mouth four (4) times a day as needed.      zinc oxide-cod liver oil (DESITIN 40%) 40 % Pste Apply topically daily as needed. 57 g 0         Past Medical History    Past Medical History:   Diagnosis Date    Abdominal pain     Acid reflux     occas    Anesthesia complication     itching, shaking, coldness; last few surgeries have gone much better    Cancer (CMS-HCC)     Cataract of right eye     COVID-19 virus infection 01/2019    Cyst of thyroid determined by ultrasound     monitoring    [name]     2 right forearm, 1 left thigh, 1 right scapula, 1 under left clavicle; multiple    Difficult intravenous access     [name] (familial adenomatous polyposis)     [name]     Gastric polyps     History of chemotherapy     last treatment approx 05/2019    History of colon polyps     History of COVID-19 01/2019    Ileus (CMS-HCC) 03/16/2022    Iron deficiency anemia due to chronic blood loss     received iron infusion 11-2019    PONV (postoperative nausea and vomiting)     Rectal bleeding     Syncopal episodes     especially if becoming dehydrated           Past Surgical History    Past Surgical History:   Procedure Laterality Date COLON SURGERY      cyroablation      cystis removal      desmoid removal      PR CLOSE ENTEROSTOMY,RESEC+ANAST N/A 10/09/2020    Procedure: ILEOSTOMY TAKEDOWN;  Surgeon: Mickle Asper, MD;  Location: OR [place];  Service: General Surgery    PR COLONOSCOPY W/BIOPSY SINGLE/MULTIPLE N/A 10/27/2012    Procedure: COLONOSCOPY, FLEXIBLE, PROXIMAL TO SPLENIC FLEXURE; WITH BIOPSY, SINGLE OR MULTIPLE;  Surgeon: Shirlyn Goltz Mir, MD;  Location: PEDS PROCEDURE ROOM [facility];  Service: Gastroenterology    PR COLONOSCOPY W/BIOPSY SINGLE/MULTIPLE N/A 09/14/2013    Procedure: COLONOSCOPY, FLEXIBLE, PROXIMAL TO SPLENIC FLEXURE; WITH BIOPSY, SINGLE OR MULTIPLE;  Surgeon: Shirlyn Goltz Mir, MD;  Location: PEDS PROCEDURE ROOM [facility];  Service: Gastroenterology    PR COLONOSCOPY W/BIOPSY SINGLE/MULTIPLE N/A 11/08/2014    Procedure: COLONOSCOPY, FLEXIBLE, PROXIMAL TO SPLENIC FLEXURE; WITH BIOPSY, SINGLE OR MULTIPLE;  Surgeon: Arnold Long Mir, MD;  Location: PEDS PROCEDURE ROOM [facility];  Service: Gastroenterology    PR COLONOSCOPY W/BIOPSY SINGLE/MULTIPLE N/A 12/26/2015    Procedure: COLONOSCOPY, FLEXIBLE, PROXIMAL TO SPLENIC FLEXURE; WITH BIOPSY, SINGLE OR MULTIPLE;  Surgeon: Arnold Long Mir, MD;  Location: PEDS PROCEDURE ROOM [facility];  Service: Gastroenterology    PR COLONOSCOPY W/BIOPSY SINGLE/MULTIPLE N/A 09/02/2017    Procedure: COLONOSCOPY, FLEXIBLE, PROXIMAL TO SPLENIC FLEXURE; WITH BIOPSY, SINGLE OR MULTIPLE;  Surgeon: Arnold Long Mir, MD;  Location: PEDS PROCEDURE ROOM [place];  Service: Gastroenterology    PR COLSC FLX W/REMOVAL LESION BY HOT BX FORCEPS N/A 08/27/2016    Procedure: COLONOSCOPY, FLEXIBLE, PROXIMAL TO SPLENIC FLEXURE; W/REMOVAL TUMOR/POLYP/OTHER LESION, HOT BX FORCEP/CAUTE;  Surgeon: Arnold Long Mir, MD;  Location: PEDS PROCEDURE ROOM [facility];  Service: Gastroenterology    PR COLSC FLX W/RMVL OF TUMOR POLYP LESION SNARE TQ N/A 02/25/2019    Procedure: COLONOSCOPY FLEX; W/REMOV TUMOR/LES BY SNARE;  Surgeon: Helyn Numbers, MD;  [facility]: [facility] PROCED[facility] [place] [facility];  Service: Gastroenterology    PR COLSC FLX W/RMVL OF TUMOR POLYP LESION SNARE TQ N/A 03/13/2020    Procedure: COLONOSCOPY FLEX; W/REMOV TUMOR/LES BY SNARE;  Surgeon: Helyn Numbers, MD;  Location: [facility] PROCEDURES [place] [facility];  Service: Gastroenterology    PR EXC SKIN BENIG 2.1-3 CM TRUNK,ARM,LEG Right 02/25/2017    Procedure: EXCISION, BENIGN LESION INCLUDE MARGINS, EXCEPT SKIN TAG, LEGS; EXCISED DIAMETER 2.1 TO 3.0 CM;  Surgeon: Clarene Duke, MD;  Location: CHILDRENS OR [facility];  Service: Plastics    PR EXC SKIN BENIG 3.1-4 CM TRUNK,ARM,LEG Right 02/25/2017    Procedure: EXCISION, BENIGN LESION INCLUDE MARGINS, EXCEPT SKIN TAG, ARMS; EXCISED DIAMETER 3.1 TO 4.0 CM;  Surgeon: Clarene Duke, MD;  Location: CHILDRENS OR [facility];  Service: Plastics    PR EXC SKIN BENIG >4 CM FACE,FACIAL Right 02/25/2017    Procedure: EXCISION, OTHER BENIGN LES INCLUD MARGIN, FACE/EARS/EYELIDS/NOSE/LIPS/MUCOUS MEMBRANE; EXCISED DIAM >4.0 CM;  Surgeon: Clarene Duke, MD;  Location: CHILDRENS OR [facility];  Service: Plastics    PR EXC TUMOR SOFT TISSUE LEG/ANKLE SUBQ 3+CM Right 08/05/2019    Procedure: EXCISION, TUMOR, SOFT TISSUE OF LEG OR ANKLE AREA, SUBCUTANEOUS; 3 CM OR GREATER;  Surgeon: Arsenio Katz, MD;  Location: MAIN OR [place];  Service: Plastics    PR EXC TUMOR SOFT TISSUE LEG/ANKLE SUBQ <3CM Right 08/05/2019    Procedure: EXCISION, TUMOR, SOFT TISSUE OF LEG OR ANKLE AREA, SUBCUTANEOUS; LESS THAN 3 CM;  Surgeon: Arsenio Katz, MD;  Location: MAIN OR [facility];  Service: Plastics    PR LAP, SURG PROCTECTOMY W J-POUCH N/A 08/10/2020    Procedure: ROBOTIC ASSISTED LAPAROSCOPIC PROCTOCOLECTOMY, ILEAL J POUCH, WITH OSTOMY;  Surgeon: Mickle Asper, MD;  Location: OR [place];  Service: General Surgery    PR NDSC EVAL INTSTINAL POUCH DX W/COLLJ SPEC SPX N/A 01/23/2021    Procedure: ENDO EVAL SM INTEST POUCH; DX;  Surgeon: Modena Nunnery, MD;  Location: [facility]ED[facility] [place] [facility];  Service: Gastroenterology    PR NDSC EVAL INTSTINAL POUCH DX W/COLLJ SPEC SPX  N/A 08/27/2021    Procedure: ENDO EVAL SM INTEST POUCH; DX;  Surgeon: Hunt Oris, MD;  Location: GI PROCEDURES [facility];  Service: Gastroenterology    PR NDSC EVAL INTSTINAL POUCH DX W/COLLJ SPEC SPX N/A 12/09/2021    Procedure: ENDO EVAL SM INTEST POUCH; DX;  Surgeon: Vidal Schwalbe, MD;  Location: GI PROCEDURES [facility];  Service: Gastroenterology    PR NDSC EVAL INTSTINAL POUCH DX W/COLLJ [facility] SPX Left 04/09/2022    Procedure: ENDO EVAL SM INTEST POUCH; DX;  Surgeon: Modena Nunnery, MD;  Location: [facility] [place] [facility];  Service: Gastroenterology    PR NDSC EVAL INTSTINAL POUCH DX W/COLLJ SPEC SPX N/A 08/05/2022    Procedure: ENDO EVAL SM INTEST POUCH; DX;  Surgeon: Modena Nunnery, MD;  Location: GI PROCEDURES [facility];  Service: Gastroenterology    PR NDSC EVAL INTSTINAL POUCH DX W/COLLJ SPEC SPX N/A 03/13/2023    Procedure: ENDO EVAL SM INTEST POUCH; DX;  Surgeon: Carmon Ginsberg, MD;  Location: GI PROCEDURES [facility];  Service: Gastroenterology    PR NDSC EVAL INTSTINAL POUCH W/BX SINGLE/MULTIPLE N/A 01/20/2022    Procedure: ENDOSCOPIC EVAL OF SMALL INTESTINAL POUCH; DIAGNOSTIC, No biopsies;  Surgeon: Andrey Farmer, MD;  Location: GI PROCEDURES [facility];  Service: Gastroenterology    PR NDSC EVAL INTSTINAL POUCH W/BX SINGLE/MULTIPLE N/A 02/13/2022    Procedure: ENDOSCOPIC EVAL OF SMALL INTESTINAL POUCH; DIAGNOSTIC, WITH BIOPSY;  Surgeon: Bronson Curb, MD;  Location: GI PROCEDURES [facility];  Service: Gastroenterology    PR NDSC EVAL INTSTINAL POUCH W/BX SINGLE/MULTIPLE N/A 03/13/2023    Procedure: ENDOSCOPIC EVAL OF SMALL INTESTINAL POUCH; DIAGNOSTIC, WITH BIOPSY;  Surgeon: Carmon Ginsberg, MD;  Location: GI PROCEDURES [facility];  Service: Gastroenterology    PR UNLISTED PROCEDURE SMALL INTESTINE  01/23/2021 Procedure: UNLISTED PROCEDURE, SMALL INTESTINE;  Surgeon: Modena Nunnery, MD;  Location: [facility] [place] [facility];  Service: Gastroenterology    PR UNLISTED PROCEDURE SMALL INTESTINE  02/13/2022    Procedure: UNLISTED PROCEDURE, SMALL INTESTINE;  Surgeon: Bronson Curb, MD;  Location: GI PROCEDURES [facility];  Service: Gastroenterology    PR UPPER GI ENDOSCOPY,BIOPSY N/A 10/27/2012    Procedure: UGI ENDOSCOPY; WITH BIOPSY, SINGLE OR MULTIPLE;  Surgeon: Shirlyn Goltz Mir, MD;  Location: PEDS PROCEDURE ROOM [facility];  Service: Gastroenterology    PR UPPER GI ENDOSCOPY,BIOPSY N/A 09/14/2013    Procedure: UGI ENDOSCOPY; WITH BIOPSY, SINGLE OR MULTIPLE;  Surgeon: Shirlyn Goltz Mir, MD;  Location: PEDS PROCEDURE ROOM [facility];  Service: Gastroenterology    PR UPPER GI ENDOSCOPY,BIOPSY N/A 11/08/2014    Procedure: UGI ENDOSCOPY; WITH BIOPSY, SINGLE OR MULTIPLE;  Surgeon: Arnold Long Mir, MD;  Location: PEDS PROCEDURE ROOM [facility];  Service: Gastroenterology    PR UPPER GI ENDOSCOPY,BIOPSY N/A 12/26/2015    Procedure: UGI ENDOSCOPY; WITH BIOPSY, SINGLE OR MULTIPLE;  Surgeon: Arnold Long Mir, MD;  Location: PEDS PROCEDURE ROOM [facility];  Service: Gastroenterology    PR UPPER GI ENDOSCOPY,BIOPSY N/A 08/27/2016    Procedure: UGI ENDOSCOPY; WITH BIOPSY, SINGLE OR MULTIPLE;  Surgeon: Arnold Long Mir, MD;  Location: PEDS PROCEDURE ROOM [facility];  Service: Gastroenterology    PR UPPER GI ENDOSCOPY,BIOPSY N/A 09/02/2017    Procedure: UGI ENDOSCOPY; WITH BIOPSY, SINGLE OR MULTIPLE;  Surgeon: Arnold Long Mir, MD;  Location: PEDS PROCEDURE ROOM [facility];  Service: Gastroenterology    PR UPPER GI ENDOSCOPY,BIOPSY N/A 03/13/2020    Procedure: UGI ENDOSCOPY; WITH BIOPSY, SINGLE OR MULTIPLE;  Surgeon: Soyla Dryer  Cheron Schaumann, MD;  Location: GI [facility]ED[facility] [place] [facility];  Service: Gastroenterology    PR UPPER GI ENDOSCOPY,BIOPSY N/A 09/05/2021    Procedure: UGI ENDOSCOPY; WITH BIOPSY, SINGLE OR MULTIPLE;  Surgeon: Wendall Papa, MD;  Location: GI PROCEDURES [facility];  Service: Gastroenterology    PR UPPER GI ENDOSCOPY,DIAGNOSIS N/A 01/20/2022    Procedure: UGI ENDO, INCLUDE ESOPHAGUS, STOMACH, & DUODENUM &/OR JEJUNUM; DX W/WO COLLECTION SPECIMN, BY BRUSH OR WASH;  Surgeon: Andrey Farmer, MD;  Location: GI PROCEDURES [facility];  Service: Gastroenterology    TUMOR REMOVAL      multiple-head, neck, back, hand, right flank, multiple           Family History    Family History   Problem Relation Age of Onset    No Known Problems Mother     No Known Problems Father     No Known Problems Sister     No Known Problems Brother     Stroke Maternal Grandmother     Other Maternal Grandmother         benign lesions of liver and pancreas, further details unknown    Cancer Maternal Grandmother     Diabetes Maternal Grandmother     Hypertension Maternal Grandmother     Thyroid disease Maternal Grandmother     Arthritis Maternal Grandfather     Asthma Maternal Grandfather     COPD Paternal Grandmother         Deceased    Miscarriages / Stillbirths Paternal Grandmother     Alcohol abuse Paternal Grandfather         Deceased    No Known Problems Maternal Aunt     No Known Problems Maternal Uncle     No Known Problems Paternal Aunt     No Known Problems Paternal Uncle     Anesthesia problems Neg Hx     Broken bones Neg Hx     Cancer Neg Hx     Clotting disorder Neg Hx     Collagen disease Neg Hx     Diabetes Neg Hx     Dislocations Neg Hx     Fibromyalgia Neg Hx     Gout Neg Hx     Hemophilia Neg Hx     Osteoporosis Neg Hx     Rheumatologic disease Neg Hx     Scoliosis Neg Hx     Severe sprains Neg Hx     Sickle cell anemia Neg Hx     Spinal Compression Fracture Neg Hx     Melanoma Neg Hx     Basal cell carcinoma Neg Hx     Squamous cell carcinoma Neg Hx          Social History:  Social History     Tobacco Use   Smoking Status Never    Passive exposure: Past   Smokeless Tobacco Never     Social History     Substance and Sexual Activity   Alcohol Use Never Social History     Substance and Sexual Activity   Drug Use Never         Lab Results   Component Value Date    CREATININE 0.56 03/25/2023       Lab Results   Component Value Date    ALKPHOS 63 03/25/2023    BILITOT 0.3 03/25/2023    BILIDIR <0.10 03/17/2023    PROT 6.9 03/25/2023    ALBUMIN 3.7 03/25/2023    ALT 13 03/25/2023  AST 17 03/25/2023       Encounter Date: 03/08/23   ECG 12 Lead   Result Value    EKG Systolic BP     EKG Diastolic BP     EKG Ventricular Rate 72    EKG Atrial Rate 72    EKG P-R Interval 134    EKG QRS Duration 86    EKG Q-T Interval 418    EKG QTC Calculation 457    EKG Calculated P Axis 49    EKG Calculated R Axis 16    EKG Calculated T Axis 35    QTC Fredericia 444    Narrative    NORMAL SINUS RHYTHM  NORMAL ECG  WHEN COMPARED WITH ECG OF 08-Mar-2023 23:46,  NO SIGNIFICANT CHANGE WAS FOUND  Confirmed by Schuyler Amor ([id number]) on 03/15/2023 2:59:17 PM       No results found for requested labs within last 30 days.       Objective:     Vital Signs  Temp:  [36.6 ??C (97.9 ??F)-37.3 ??C (99.1 ??F)] 36.8 ??C (98.3 ??F)  Heart Rate:  [65-79] 79  SpO2 Pulse:  [65-76] 74  Resp:  [7-16] 16  BP: (106-136)/(59-81) 115/78  MAP (mmHg):  [73-91] 89  SpO2:  [95 %-98 %] 98 %    Physical Exam    GENERAL:  Well developed, well-nourished female and is in no apparent distress.   HEAD/NECK:    Reveals normocephalic/atraumatic.   CARDIOVASCULAR:   Regular rate  LUNGS:   Normal work of breathing, no supplemental 02  EXTREMITIES:  Warm, no clubbing, cyanosis, or edema was noted.  NEUROLOGIC:    The patient was alert and oriented times four with normal language, attention, cognition and memory. Cranial nerve exam was grossly normal.  MUSCULOSKELETAL:    Moving all 4 extremities  SKIN:  No obvious rashes lesions or erythema  PSY:  Appropriate affect and mood.      Problem List    Principal Problem:    Cancer associated pain  Active Problems:    [name]    Pain medication agreement signed    Pouchitis (CMS-HCC)

## 2023-03-28 LAB — COMPREHENSIVE METABOLIC PANEL
ALBUMIN: 3.5 g/dL (ref 3.4–5.0)
ALKALINE PHOSPHATASE: 66 U/L (ref 46–116)
ALT (SGPT): 17 U/L (ref 10–49)
ANION GAP: 12 mmol/L (ref 5–14)
AST (SGOT): 21 U/L (ref <=34)
BILIRUBIN TOTAL: 0.6 mg/dL (ref 0.3–1.2)
BLOOD UREA NITROGEN: 7 mg/dL — ABNORMAL LOW (ref 9–23)
BUN / CREAT RATIO: 13
CALCIUM: 9.7 mg/dL (ref 8.7–10.4)
CHLORIDE: 102 mmol/L (ref 98–107)
CO2: 26 mmol/L (ref 20.0–31.0)
CREATININE: 0.56 mg/dL (ref 0.55–1.02)
EGFR CKD-EPI (2021) FEMALE: >90 mL/min/{1.73_m2} (ref >=60)
GLUCOSE RANDOM: 67 mg/dL — ABNORMAL LOW (ref 70–179)
POTASSIUM: 3.8 mmol/L (ref 3.4–4.8)
PROTEIN TOTAL: 6.7 g/dL (ref 5.7–8.2)
SODIUM: 140 mmol/L (ref 135–145)

## 2023-03-28 LAB — CBC
HEMATOCRIT: 36.4 % (ref 34.0–44.0)
HEMOGLOBIN: 12.3 g/dL (ref 11.3–14.9)
MEAN CORPUSCULAR HEMOGLOBIN CONC: 33.7 g/dL (ref 32.0–36.0)
MEAN CORPUSCULAR HEMOGLOBIN: 27.1 pg (ref 25.9–32.4)
MEAN CORPUSCULAR VOLUME: 80.5 fL (ref 77.6–95.7)
MEAN PLATELET VOLUME: 10 fL (ref 6.8–10.7)
PLATELET COUNT: 190 10*9/L (ref 150–450)
RED BLOOD CELL COUNT: 4.52 10*12/L (ref 3.95–5.13)
RED CELL DISTRIBUTION WIDTH: 15.7 % — ABNORMAL HIGH (ref 12.2–15.2)
WBC ADJUSTED: 9.5 10*9/L (ref 3.6–11.2)

## 2023-03-28 LAB — VANCOMYCIN, RANDOM: VANCOMYCIN RANDOM: 10.7 ug/mL

## 2023-03-28 MED ADMIN — HYDROmorphone (PF) (DILAUDID) injection 1 mg: 1 mg | INTRAVENOUS | @ 22:00:00 | Stop: 2023-04-11

## 2023-03-28 MED ADMIN — dextrose 10 % infusion: 25 mL/h | INTRAVENOUS | @ 23:00:00 | Stop: 2023-03-29

## 2023-03-28 MED ADMIN — lidocaine (ASPERCREME) 4 % 1 patch: 1 | TRANSDERMAL | @ 15:00:00

## 2023-03-28 MED ADMIN — ondansetron (ZOFRAN) injection 8 mg: 8 mg | INTRAVENOUS | @ 11:00:00

## 2023-03-28 MED ADMIN — HYDROmorphone (PF) (DILAUDID) injection 1 mg: 1 mg | INTRAVENOUS | @ 18:00:00 | Stop: 2023-04-11

## 2023-03-28 MED ADMIN — promethazine (PHENERGAN) 12.5 mg in sodium chloride (NS) 0.9 % 50 mL IVPB: 12.5 mg | INTRAVENOUS | @ 05:00:00 | Stop: 2023-03-28

## 2023-03-28 MED ADMIN — oxyCODONE (ROXICODONE) 5 mg/5 mL solution 20 mg: 20 mg | ORAL | @ 14:00:00 | Stop: 2023-04-09

## 2023-03-28 MED ADMIN — promethazine (PHENERGAN) 12.5 mg in sodium chloride (NS) 0.9 % 50 mL IVPB: 12.5 mg | INTRAVENOUS | @ 22:00:00

## 2023-03-28 MED ADMIN — diphenhydrAMINE (BENADRYL) injection: 50 mg | INTRAVENOUS | @ 13:00:00

## 2023-03-28 MED ADMIN — ertapenem (INVANZ) 1 g in sodium chloride 0.9 % (NS) 100 mL IVPB-MBP: 1 g | INTRAVENOUS | @ 15:00:00 | Stop: 2023-03-28

## 2023-03-28 MED ADMIN — vancomycin (VANCOCIN) 750 mg in dextrose 5 % 150 mL IVPB (premix): 750 mg | INTRAVENOUS | @ 20:00:00 | Stop: 2023-03-28

## 2023-03-28 MED ADMIN — promethazine (PHENERGAN) 12.5 mg in sodium chloride (NS) 0.9 % 50 mL IVPB: 12.5 mg | INTRAVENOUS | @ 14:00:00 | Stop: 2023-03-28

## 2023-03-28 MED ADMIN — HYDROmorphone (PF) (DILAUDID) injection 1 mg: 1 mg | INTRAVENOUS | @ 09:00:00 | Stop: 2023-03-28

## 2023-03-28 MED ADMIN — diphenhydrAMINE (BENADRYL) injection: 50 mg | INTRAVENOUS | @ 10:00:00 | Stop: 2023-03-28

## 2023-03-28 MED ADMIN — LORazepam (ATIVAN) tablet 1 mg: 1 mg | ORAL | @ 19:00:00

## 2023-03-28 MED ADMIN — vancomycin (VANCOCIN) 750 mg in dextrose 5 % 150 mL IVPB (premix): 750 mg | INTRAVENOUS | @ 02:00:00 | Stop: 2023-04-03

## 2023-03-28 MED ADMIN — ondansetron (ZOFRAN) injection 8 mg: 8 mg | INTRAVENOUS | @ 02:00:00

## 2023-03-28 MED ADMIN — ondansetron (ZOFRAN) injection 8 mg: 8 mg | INTRAVENOUS | @ 19:00:00

## 2023-03-28 MED ADMIN — diphenhydrAMINE (BENADRYL) injection: 50 mg | INTRAVENOUS | @ 04:00:00

## 2023-03-28 MED ADMIN — HYDROmorphone (PF) (DILAUDID) injection 1 mg: 1 mg | INTRAVENOUS | @ 05:00:00 | Stop: 2023-03-31

## 2023-03-28 MED ADMIN — vancomycin (VANCOCIN) 750 mg in dextrose 5 % 150 mL IVPB (premix): 750 mg | INTRAVENOUS | @ 11:00:00 | Stop: 2023-03-28

## 2023-03-28 MED ADMIN — diphenhydrAMINE (BENADRYL) injection: 50 mg | INTRAVENOUS | @ 01:00:00 | Stop: 2023-03-27

## 2023-03-28 MED ADMIN — HYDROmorphone (PF) (DILAUDID) injection 1 mg: 1 mg | INTRAVENOUS | Stop: 2023-03-31

## 2023-03-28 NOTE — Unmapped (Signed)
Problem: Adult Inpatient Plan of Care  Goal: Plan of Care Review  Outcome: Ongoing - Unchanged  Flowsheets (Taken 03/28/2023 0437)  Plan of Care Reviewed With: patient  Goal: Patient-Specific Goal (Individualized)  Outcome: Ongoing - Unchanged  Goal: Absence of Hospital-Acquired Illness or Injury  Outcome: Ongoing - Unchanged  Intervention: Identify and Manage Fall Risk  Recent Flowsheet Documentation  Taken 03/27/2023 2200 by Crista Elliot, RN  Safety Interventions:   environmental modification   fall reduction program maintained   nonskid shoes/slippers when out of bed   lighting adjusted for tasks/safety   low bed   family at bedside  Intervention: Prevent Skin Injury  Recent Flowsheet Documentation  Taken 03/28/2023 0400 by Crista Elliot, RN  Positioning for Skin: Right  Taken 03/28/2023 0200 by Crista Elliot, RN  Positioning for Skin: Supine/Back  Taken 03/28/2023 0000 by Crista Elliot, RN  Positioning for Skin: Supine/Back  Taken 03/27/2023 2200 by Crista Elliot, RN  Positioning for Skin: Right  Taken 03/27/2023 1918 by Crista Elliot, RN  Positioning for Skin: Supine/Back  Device Skin Pressure Protection:   absorbent pad utilized/changed   adhesive use limited  Skin Protection:   adhesive use limited   incontinence pads utilized  Goal: Optimal Comfort and Wellbeing  Outcome: Ongoing - Unchanged  Goal: Readiness for Transition of Care  Outcome: Ongoing - Unchanged  Goal: Rounds/Family Conference  Outcome: Ongoing - Unchanged     Problem: Infection  Goal: Absence of Infection Signs and Symptoms  Outcome: Ongoing - Unchanged  Intervention: Prevent or Manage Infection  Recent Flowsheet Documentation  Taken 03/27/2023 2200 by Crista Elliot, RN  Isolation Precautions: enteric precautions maintained    Alert and oriented, VSS on RA, poor PO intake and not tolerating PO meds. Patient requested IV Benadryl as premedication for Vancomycin instead of PO, provider notified thru secure chat. Encouraged to try PO first, she took a sip of Benadryl but immediately complained of burning sensation in her throat and going to throw up. Provider was informed about this and placed an order for IV benadryl. About 2 hours after starting Vancomycin infusion, she started to feel itchiness and has rashes on her chest, abdomen, face and neck. Messaged the provider if it's ok to give PRN IV Benadryl given that she received a dose prior to infusion, ok to give per MD. Patient also refused Oxy PO after multiple attempts ,stating her throat still hurts after trying the Benadryl. PRN IV dilaudid given for pain control. Zofran and PRN Phenergan given for Nausea, please see MAR. Rounds performed and fall precautions maintained. Will continue to monitor.

## 2023-03-28 NOTE — Unmapped (Signed)
VENOUS ACCESS TEAM PROCEDURE    Nurse request was placed for a PIV by Venous Access Team (VAT).  Patient was assessed at bedside for placement of a PIV. PPE were donned per protocol.  Access was obtained. Blood return noted.  Dressing intact and device well secured.  Flushed with normal saline.  See LDA for details.  Pt advised to inform RN of any s/s of discomfort at the PIV site.    Patient has very poor vasculature. Team aware that patient desires a PICC.    Workup / Procedure Time:  15 minutes       Care RN was notified.       Thank you,     Krystalynn Ridgeway Jean Rosenthal, RN Venous Access Team

## 2023-03-28 NOTE — Unmapped (Signed)
The Venous Access Team (VAT) received a request for PIV access.   Patient currently has 1 one working PIV. The next dose of VANC is not due until later this afternoon. MD Marsa Aris messaged and made aware that patient will probably need central access as she has 18 doses of VANC left. Awaiting response of team as to what the plan of care is.      PIV Workup / Procedure Time:  30 minutes    care RN was notified.     Thank you,     Davisha Linthicum Jean Rosenthal, RN Venous Access Team

## 2023-03-28 NOTE — Unmapped (Signed)
PICC LINE TRIAGE NOTE    The Venous Access Team has received an order for PICC placement.  This patient has been triaged and  VAT will attempt bedside placement after blood cultures are 48 hours negative and schedule permits.    See PICC Specific History Flowsheet for additional details.    Thank You,    Axavier Pressley, RN Venous Access Team [phone]      Workup Time:  30 minutes

## 2023-03-28 NOTE — Unmapped (Signed)
Patient oriented to the unit. Patients admissions completed. Patient had pain that was controlled through prn pain medications. Patient able to rest, patient still does not wish to eat or drink due to pain but fluids encouraged. Patient overall calm and cooperative.  Problem: Adult Inpatient Plan of Care  Goal: Plan of Care Review  Outcome: Ongoing - Unchanged  Flowsheets (Taken 03/27/2023 1730)  Progress: no change  Plan of Care Reviewed With: patient  Goal: Absence of Hospital-Acquired Illness or Injury  Outcome: Ongoing - Unchanged  Intervention: Prevent Skin Injury  Recent Flowsheet Documentation  Taken 03/27/2023 1715 by Corey Harold, RN  Positioning for Skin: Supine/Back  Device Skin Pressure Protection: absorbent pad utilized/changed  Skin Protection: adhesive use limited  Goal: Optimal Comfort and Wellbeing  Outcome: Ongoing - Unchanged  Goal: Readiness for Transition of Care  Outcome: Ongoing - Unchanged     Problem: Infection  Goal: Absence of Infection Signs and Symptoms  Outcome: Ongoing - Unchanged     Problem: Latex Allergy  Goal: Absence of Allergy Symptoms  Outcome: Ongoing - Unchanged     Problem: Oncology Care  Goal: Effective Coping  Outcome: Ongoing - Unchanged  Goal: Improved Activity Tolerance  Outcome: Ongoing - Unchanged  Intervention: Promote Improved Energy  Recent Flowsheet Documentation  Taken 03/27/2023 1715 by Corey Harold, RN  Fatigue Management: activity schedule adjusted  Goal: Optimal Oral Intake  Outcome: Ongoing - Unchanged  Goal: Improved Oral Mucous Membrane Integrity  Outcome: Ongoing - Unchanged  Intervention: Promote Oral Comfort and Health  Recent Flowsheet Documentation  Taken 03/27/2023 1715 by Corey Harold, RN  Oral Mucous Membrane Protection: lip/mouth moisturizer applied  Goal: Optimal Pain Control and Function  Outcome: Ongoing - Unchanged     Problem: Self-Care Deficit  Goal: Improved Ability to Complete Activities of Daily Living  Outcome: Ongoing - Unchanged  Intervention: Promote Activity and Functional Independence  Recent Flowsheet Documentation  Taken 03/27/2023 1715 by Corey Harold, RN  Self-Care Promotion: independence encouraged     Problem: Fall Injury Risk  Goal: Absence of Fall and Fall-Related Injury  Outcome: Ongoing - Unchanged  Intervention: Identify and Manage Contributors  Recent Flowsheet Documentation  Taken 03/27/2023 1715 by Corey Harold, RN  Self-Care Promotion: independence encouraged     Problem: Skin Injury Risk Increased  Goal: Skin Health and Integrity  Outcome: Ongoing - Unchanged  Intervention: Optimize Skin Protection  Recent Flowsheet Documentation  Taken 03/27/2023 1715 by Corey Harold, RN  Pressure Reduction Techniques: frequent weight shift encouraged  Pressure Reduction Devices: alternating pressure pump (APP)  Skin Protection: adhesive use limited

## 2023-03-28 NOTE — Unmapped (Signed)
Hospital Medicine Daily Progress Note    Assessment/Plan:    Principal Problem:    Cancer associated pain  Active Problems:    [name]    Pain medication agreement signed    Pouchitis (CMS-HCC)   Malnutrition Evaluation as performed by RD, LDN: Severe Protein-Calorie Malnutrition in the context of chronic illness (03/27/23 1311)             [name] [name] is a 23 y.o. 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     Positive blood culture for Strep mitis  Blood cultures 03/25/23 (ordered by ED on presentation for workup of poor PO intake) growing Strep mitis in 1/2 sets. Unclear if this is true bacteremia or contaminant as she was afebrile, without leukocytosis or sepsis on admission. If true infection, low suspicion for odontogenic source given good dentition and regular dental care; GI source is consideration, but GI luminal team has low suspicion for recent pouchitis as source as inflammation was minimal on recent pouchoscopy, and she has been taking cefdinir since then). She was started on empiric vancomycin overnight and has been having Redman syndrome and itching, but no evidence of airway compromise.  Case discussed with ID today 1/17; there is some ceftriaxone-resistance in Strep mitis and patient with prior polymicrobial bacteremia in setting of TPN, so recommend treating with vanc and ertapenem for broad coverage for now  - Follow up final blood cultures and Strep mitis sensitivities  - Repeat blood cultures for clearance  - Hold off on TTE for now, would obtain if repeat cultures are not clear  - Appreciate ID recommendations  - Continue vancomycin (1/16 - ) with Benadryl pre-medication  - Start ertapenem (1/17 - )    Poor PO intake / Nausea  Presyncopal episodes at home  Severe Protein-Calorie Malnutrition in the context of chronic illness (03/27/23 1311)  Energy Intake: < or equal to 75% of estimated energy requirement for > or equal to 1 month  Interpretation of Wt. Loss: > 7.5% x 3 month  Malnutrition Score: 2  Patient presented as she felt she needed to be back on TPN. This has been discussed with her primary outpatient providers including Dr. [name], Dr. Carmon Sails, Dr. [name], and [name] her NP who do not see a current role for TPN at this time, especially in light of her new positive blood cultures that would preclude line placement. She notes poor PO intake but has been able to keep down small amounts of crackers and liquid. She does not usually vomit but has felt very nauseated and feels that her chronic abdominal pain is worsened by eating. She currently does not feel able to take PO medications and requests IV alternatives where possible. She is hemodynamically stable, moist mucous membranes on exam. Labs extremely reassuring with normal electrolytes, albumin 3.7. Case discussed with GI today 1/17 who recommend focusing on controlling pain and nausea before pursuing any more invasive interventions such as Corpak placement.   - Appreciate GI recommendations  - Appreciate dietician recommendations  - Zofran 8 mg IV q8h scheduled   - Phenergan 12.5 mg IV q6h prn intractable nausea  - Continue home scopolamine patch     Acute on chronic pain  In addition to nausea as above, patient presents due to uncontrolled abdominal pain, worse in the lower quadrants.  No peritonitic signs on exam. Follows with chronic pain, see treatment plan from 1/15.  - Appreciate chronic pain recommendations  - Continue home butrans patch, scheduled tylenol  - Oxycodone liquid 20 mg q4h prn   - Continue Dilaudid 1 mg IV q4h prn severe breakthrough pain; do not escalate frequency without contacting chronic pain first. Would wean as soon as patient feels ready.      Recent mild pouchitis  Ongoing bright red blood per rectum  Patient with total abdominal colectomy with J pouch in 2022. Recent admission (12/29-1/10) for pouchitis, with pouchoscopy 03/13/23 showing minimal inflammation consistent with healing pouchitis. She has been taking cefdinir for planned 8 week course (300 mg BID 1/1-1/28, then 300 mg daily 1/29-2/25). She continues to have some BRBPR with each bowel movement and reports frequent watery stools. Her H/H stable on admission (Hgb 12.8) and VSS.   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 [name]. Patient has numerous sites of disease but most bothersome has been mesenteric sites which have caused chronic abdominal pain, nausea/vomiting. She presents at this time due to inadequately controlled abdominal pain and nausea, which have made it difficult for her to take in food or water. Please see Dr. Brynda Rim note from 1/14 addended 1/15 with his very thoughtful assessment of this situation and current hospitalization.  - Continue home nirogacestat as tolerated -- patient is also trying not taking it for a few days to see if it helps abdominal symptoms    Chronic Problems  PTSD  GAD  MDD  - Continue home lexapro  - Continue home ativan PRN     GERD  - Continue protonix, famotidine     Allergies  - Continue cetirizine     Checklist:  Diet: Regular Diet  DVT PPx: Ambulation  Code Status: Full Code  Dispo: anticipate discharge home vs AIR (based on PT/OT recs)    I personally spent 55 minutes face-to-face and non-face-to-face in the care of this patient, which includes all pre, intra, and post visit time on the date of service.  All documented time was specific to the E/M visit and does not include any procedures that may have been performed.    ___________________________________________________________________    Subjective:  No acute events overnight. Had redman syndrome with vancomycin infusion overnight and this afternoon despite slowing infusion; notes red rash on posterior neck, anterior chest and abdomen. Feels her lips are tingling and that her chest is tight. Complains of ongoing nausea to the point that she's afraid to take in any PO intake but has been able to sip at her oxycodone liquid. Still having lots of liquid stool with some blood. She does not want to provide a stool sample for C diff testing because she says it's all liquid, as if she's done a colonoscopy prep, and because she underwent the same workup during her last admission.     Labs/Studies:  Labs and Studies from the last 24hrs per EMR and Reviewed    Objective:  Temp:  [36.4 ??C (97.6 ??F)-37.1 ??C (98.7 ??F)] 36.5 ??C (97.7 ??F)  Heart Rate:  [69-107] 104  SpO2 Pulse:  [70-102] 100  Resp:  [10-27] 18  BP: (96-115)/(55-81) 112/75  SpO2:  [96 %-100 %] 96 %    Gen: Sitting up in bed, non-toxic-appearing, mom at bedside  Eyes: EOMI, conjunctivae clear  HENT: MMM, no tongue swelling. Patches of erythema on lateral and posterior neck with tiny scab on posterior scalp overlying patch of erythema  CV: RRR, nl S1 S2, no m/r/g.    Pulm: CTAB, speaking in full sentences, nl WOB on RA, no wheezes or crackles. No stridor.   Abd: Soft, NTND, +BS  Ext: WWP, no edema or cyanosis  Skin: Patches of erythema over neck, chest, and abdomen without any wheals or hives noted.   Neuro: A&O x 3, no focal deficits  Psych: Appropriate affect

## 2023-03-28 NOTE — Unmapped (Signed)
OCCUPATIONAL THERAPY  Evaluation (03/27/23 1329)    Patient Name:  [name] [name]       Medical Record Number: [id number]     Date of Birth: Jan 14, 2001  Sex: Female      Post-Discharge Occupational Therapy Recommendations: 5x weekly, High intensity       Equipment Recommendation  OT DME Recommendations: Defer to post acute       OT Treatment Diagnosis:      Patient presents with decreased strength, endurance, activity tolerance, balance, poor pain control, generalized weakness, dizziness, and increased fall risk that impact independent participation in ADLs, IADLs, and functional mobility skills.    Assessment  Problem List: Decreased strength, Decreased activity tolerance, Decreased endurance, Decreased mobility, Impaired ADLs, Impaired balance, Pain     Clinical Decision Making: Moderate Complexity    Assessment: 44 F hx desmoid fibromatosis, recent admission for pouchitis, p/w continued nausea, abdominal pain, and mild BRBPR, requesting initiation of TPN.     Patient seen for initial OT evaluation and occupational profile. With consideration of patient's occupational profile, assessment review, level of clinical decision making involved, and intervention plan, patient presents as a moderate complexity case w/ the following functional deficits: decreased strength, endurance, activity tolerance, balance, poor pain control, and increased fall risk that impact independent participation in ADLs, IADLs, and functional mobility skills. Patient was recently in inpatient rehab however reports they was a lapse with initiation of skilled therapy after her discharge resultant in generalized weakness and now requiring increased assistance to complete functional tasks. At baseline she is Modified independent with functional tasks using the rollator and w/c when out in the community. During session generalized weakness and dizziness were main limiting factors. She currently requires Mod-Min A with ADL tasks and Min A with functional mobility skills. She will benefit from continued acute skilled OT to address above areas and increase functional status. Recommend post-acute OT 5x/week High Intensity to maximize safety and functional independence.    Today's Interventions: Other  Today's Interventions: Functional mobility: bed mobility,  supine<>sit, sit<>stand, sitting/standing endurance and balance, postural control, short household distance ambulation, and LE ADL tasks. Patient education re: OT role, POC, DME, safety precautions with mobility skills, and discharge planning.    Activity Tolerance During Today's Session  Limited by fatigue (Limited by dizziness)    Plan  Planned Frequency of Treatment: Plan of Care Initiated: 03/27/23  1-2x per day Weekly Frequency: 3-4 days per week  Planned Treatment Duration: 04/10/23    Planned Interventions:  Education (Patient/Family/Caregiver), Self-Care/Home Training, Therapeutic Exercise, Therapeutic Activity, Home Exercise Program      GOALS:   Patient and Family Goals: Get stronger    Short Term:   SHORT GOAL #1: Patient will perform UB+LE dressing with Mod I including clothing retrieval   Time Frame : 2 weeks  SHORT GOAL #2: Patient will perform functional transfers and all aspect of toleting tasks with Mod I   Time Frame : 2 weeks  SHORT GOAL #3: Patient will perform grooming tasks standing at the sink with Mod I   Time Frame : 2 weeks    Long Term Goal #1: Patient will score 24/24 on AMPAC     Prognosis:  Good  Positive Indicators:  Family support  Barriers to Discharge: Endurance deficits, Impaired Balance    Subjective    Prior Functional Status Reports most recently fluctuating performance with ADLs, IADLs, and functional mobility skills. She is using the rollator in the home and w/c for community ambulation.  She reports increased weakness since return from inpatient rehab related to lapse in therapy. Her mother is able to provide support as needed. She works from home. States 2 falls in the past month. States increased fatigue, falls, and passing out recently. Enjoys reading, she is also taking college classes.    Medical Tests / Procedures: Reviewed in [facility]  Services patient receives prior to admission: PT    Patient / Caregiver reports: I am very weak right now      Past Medical History:   Diagnosis Date    Abdominal pain     Acid reflux     occas    Anesthesia complication     itching, shaking, coldness; last few surgeries have gone much better    Cancer (CMS-HCC)     Cataract of right eye     COVID-19 virus infection 01/2019    Cyst of thyroid determined by ultrasound     monitoring    [name]     2 right forearm, 1 left thigh, 1 right scapula, 1 under left clavicle; multiple    Difficult intravenous access     [name] (familial adenomatous polyposis)     [name]     Gastric polyps     History of chemotherapy     last treatment approx 05/2019    History of colon polyps     History of COVID-19 01/2019    Ileus (CMS-HCC) 03/16/2022    Iron deficiency anemia due to chronic blood loss     received iron infusion 11-2019    PONV (postoperative nausea and vomiting)     Rectal bleeding     Syncopal episodes     especially if becoming dehydrated    Social History     Tobacco Use    Smoking status: Never     Passive exposure: Past    Smokeless tobacco: Never   Substance Use Topics    Alcohol use: Never      Past Surgical History:   Procedure Laterality Date    COLON SURGERY      cyroablation      cystis removal      desmoid removal      PR CLOSE ENTEROSTOMY,RESEC+ANAST N/A 10/09/2020    Procedure: ILEOSTOMY TAKEDOWN;  Surgeon: Mickle Asper, MD;  Location: OR [place];  Service: General Surgery    PR COLONOSCOPY W/BIOPSY SINGLE/MULTIPLE N/A 10/27/2012    Procedure: COLONOSCOPY, FLEXIBLE, PROXIMAL TO SPLENIC FLEXURE; WITH BIOPSY, SINGLE OR MULTIPLE;  Surgeon: Shirlyn Goltz Mir, MD;  Location: PEDS PROCEDURE ROOM [place];  Service: Gastroenterology    PR COLONOSCOPY W/BIOPSY SINGLE/MULTIPLE N/A 09/14/2013    Procedure: COLONOSCOPY, FLEXIBLE, PROXIMAL TO SPLENIC FLEXURE; WITH BIOPSY, SINGLE OR MULTIPLE;  Surgeon: Shirlyn Goltz Mir, MD;  Location: PEDS PROCEDURE ROOM [place];  Service: Gastroenterology    PR COLONOSCOPY W/BIOPSY SINGLE/MULTIPLE N/A 11/08/2014    Procedure: COLONOSCOPY, FLEXIBLE, PROXIMAL TO SPLENIC FLEXURE; WITH BIOPSY, SINGLE OR MULTIPLE;  Surgeon: Arnold Long Mir, MD;  Location: PEDS PROCEDURE ROOM [facility];  Service: Gastroenterology    PR COLONOSCOPY W/BIOPSY SINGLE/MULTIPLE N/A 12/26/2015    Procedure: COLONOSCOPY, FLEXIBLE, PROXIMAL TO SPLENIC FLEXURE; WITH BIOPSY, SINGLE OR MULTIPLE;  Surgeon: Arnold Long Mir, MD;  Location: PEDS PROCEDURE ROOM [facility];  Service: Gastroenterology    PR COLONOSCOPY W/BIOPSY SINGLE/MULTIPLE N/A 09/02/2017    Procedure: COLONOSCOPY, FLEXIBLE, PROXIMAL TO SPLENIC FLEXURE; WITH BIOPSY, SINGLE OR MULTIPLE;  Surgeon: Arnold Long Mir, MD;  Location: PEDS PROCEDURE ROOM [facility];  Service: Gastroenterology  PR COLSC FLX W/REMOVAL LESION BY HOT BX FORCEPS N/A 08/27/2016    Procedure: COLONOSCOPY, FLEXIBLE, PROXIMAL TO SPLENIC FLEXURE; W/REMOVAL TUMOR/POLYP/OTHER LESION, HOT BX FORCEP/CAUTE;  Surgeon: Arnold Long Mir, MD;  Location: PEDS PROCEDURE ROOM [facility];  Service: Gastroenterology    PR COLSC FLX W/RMVL OF TUMOR POLYP LESION SNARE TQ N/A 02/25/2019    Procedure: COLONOSCOPY FLEX; W/REMOV TUMOR/LES BY SNARE;  Surgeon: Helyn Numbers, MD;  Location: [facility] PROCEDURES [place] [facility];  Service: Gastroenterology    PR COLSC FLX W/RMVL OF TUMOR POLYP LESION SNARE TQ N/A 03/13/2020    Procedure: COLONOSCOPY FLEX; W/REMOV TUMOR/LES BY SNARE;  Surgeon: Helyn Numbers, MD;  Location: [facility] [place] [facility];  Service: Gastroenterology    PR EXC SKIN BENIG 2.1-3 CM TRUNK,ARM,LEG Right 02/25/2017    Procedure: EXCISION, BENIGN LESION INCLUDE MARGINS, EXCEPT SKIN TAG, LEGS; EXCISED DIAMETER 2.1 TO 3.0 CM;  Surgeon: Clarene Duke, MD; Location: CHILDRENS OR [facility];  Service: Plastics    PR EXC SKIN BENIG 3.1-4 CM TRUNK,ARM,LEG Right 02/25/2017    Procedure: EXCISION, BENIGN LESION INCLUDE MARGINS, EXCEPT SKIN TAG, ARMS; EXCISED DIAMETER 3.1 TO 4.0 CM;  Surgeon: Clarene Duke, MD;  Location: CHILDRENS OR [facility];  Service: Plastics    PR EXC SKIN BENIG >4 CM FACE,FACIAL Right 02/25/2017    Procedure: EXCISION, OTHER BENIGN LES INCLUD MARGIN, FACE/EARS/EYELIDS/NOSE/LIPS/MUCOUS MEMBRANE; EXCISED DIAM >4.0 CM;  Surgeon: Clarene Duke, MD;  Location: CHILDRENS OR [facility];  Service: Plastics    PR EXC TUMOR SOFT TISSUE LEG/ANKLE SUBQ 3+CM Right 08/05/2019    Procedure: EXCISION, TUMOR, SOFT TISSUE OF LEG OR ANKLE AREA, SUBCUTANEOUS; 3 CM OR GREATER;  Surgeon: Arsenio Katz, MD;  Location: MAIN OR [place];  Service: Plastics    PR EXC TUMOR SOFT TISSUE LEG/ANKLE SUBQ <3CM Right 08/05/2019    Procedure: EXCISION, TUMOR, SOFT TISSUE OF LEG OR ANKLE AREA, SUBCUTANEOUS; LESS THAN 3 CM;  Surgeon: Arsenio Katz, MD;  Location: MAIN OR [facility];  Service: Plastics    PR LAP, SURG PROCTECTOMY W J-POUCH N/A 08/10/2020    Procedure: ROBOTIC ASSISTED LAPAROSCOPIC PROCTOCOLECTOMY, ILEAL J POUCH, WITH OSTOMY;  Surgeon: Mickle Asper, MD;  Location: OR [place];  Service: General Surgery    PR NDSC EVAL INTSTINAL POUCH DX W/COLLJ SPEC SPX N/A 01/23/2021    Procedure: ENDO EVAL SM INTEST POUCH; DX;  Surgeon: Modena Nunnery, MD;  Location: [facility] [place] [facility];  Service: Gastroenterology    PR NDSC EVAL INTSTINAL POUCH DX W/COLLJ SPEC SPX N/A 08/27/2021    Procedure: ENDO EVAL SM INTEST POUCH; DX;  Surgeon: Hunt Oris, MD;  [facility]: [facility];  Service: Gastroenterology    PR NDSC EVAL INTSTINAL POUCH DX W/COLLJ SPEC SPX N/A 12/09/2021    Procedure: ENDO EVAL SM INTEST POUCH; DX;  Surgeon: Vidal Schwalbe, MD;  Location: [facility] PROC[facility]URES [facility];  Service: Gastroenterology    PR NDSC EVAL INTSTINAL POUCH DX W/COLLJ [facility] SPX Left 04/09/2022    Procedure: ENDO EVAL SM INTEST POUCH; DX;  Surgeon: Modena Nunnery, MD;  Location: [facility] [place] [facility];  Service: Gastroenterology    PR NDSC EVAL INTSTINAL POUCH DX W/COLLJ SPEC SPX N/A 08/05/2022    Procedure: ENDO EVAL SM INTEST POUCH; DX;  Surgeon: Modena Nunnery, MD;  Location: [facility]URES [facility];  Service: Gastroenterology    PR NDSC EVAL INTSTINAL POUCH DX W/COLLJ SPEC SPX N/A 03/13/2023    Procedure: ENDO EVAL SM INTEST POUCH; DX;  Surgeon: Carmon Ginsberg, MD;  Location: [facility] PROCEDURES [facility];  Service: Gastroenterology    PR NDSC EVAL INTSTINAL POUCH W/BX SINGLE/MULTIPLE N/A 01/20/2022    Procedure: ENDOSCOPIC EVAL OF SMALL INTESTINAL POUCH; DIAGNOSTIC, No biopsies;  Surgeon: Andrey Farmer, MD;  Location: GI PROCEDURES [facility];  Service: Gastroenterology    PR NDSC EVAL INTSTINAL POUCH W/BX SINGLE/MULTIPLE N/A 02/13/2022    Procedure: ENDOSCOPIC EVAL OF SMALL INTESTINAL POUCH; DIAGNOSTIC, WITH BIOPSY;  Surgeon: Bronson Curb, MD;  Location: [facility] PROCEDURES [facility];  Service: Gastroenterology    PR NDSC EVAL INTSTINAL POUCH W/BX SINGLE/MULTIPLE N/A 03/13/2023    Procedure: ENDOSCOPIC EVAL OF SMALL INTESTINAL POUCH; DIAGNOSTIC, WITH BIOPSY;  Surgeon: Carmon Ginsberg, MD;  Location: GI PROCEDURES [facility];  Service: Gastroenterology    PR UNLISTED PROCEDURE SMALL INTESTINE  01/23/2021    Procedure: UNLISTED PROCEDURE, SMALL INTESTINE;  Surgeon: Modena Nunnery, MD;  Location: [facility] [place] [facility];  Service: Gastroenterology    PR UNLISTED PROCEDURE SMALL INTESTINE  02/13/2022    Procedure: UNLISTED PROCEDURE, SMALL INTESTINE;  Surgeon: Bronson Curb, MD;  Location: GI PROCEDURES [facility];  Service: Gastroenterology    PR UPPER GI ENDOSCOPY,BIOPSY N/A 10/27/2012    Procedure: UGI ENDOSCOPY; WITH BIOPSY, SINGLE OR MULTIPLE;  Surgeon: Shirlyn Goltz Mir, MD;  Location: PEDS PROCEDURE ROOM [facility];  Service: Gastroenterology    PR UPPER GI ENDOSCOPY,BIOPSY N/A 09/14/2013    Procedure: UGI ENDOSCOPY; WITH BIOPSY, SINGLE OR MULTIPLE;  Surgeon: Shirlyn Goltz Mir, MD;  Location: PEDS PROCEDURE ROOM [facility];  Service: Gastroenterology    PR UPPER GI ENDOSCOPY,BIOPSY N/A 11/08/2014    Procedure: UGI ENDOSCOPY; WITH BIOPSY, SINGLE OR MULTIPLE;  Surgeon: Arnold Long Mir, MD;  Location: PEDS PROCEDURE ROOM [facility];  Service: Gastroenterology    PR UPPER GI ENDOSCOPY,BIOPSY N/A 12/26/2015    Procedure: UGI ENDOSCOPY; WITH BIOPSY, SINGLE OR MULTIPLE;  Surgeon: Arnold Long Mir, MD;  Location: PEDS PROCEDURE ROOM [facility];  Service: Gastroenterology    PR UPPER GI ENDOSCOPY,BIOPSY N/A 08/27/2016    Procedure: UGI ENDOSCOPY; WITH BIOPSY, SINGLE OR MULTIPLE;  Surgeon: Arnold Long Mir, MD;  Location: PEDS PROCEDURE ROOM [facility];  Service: Gastroenterology    PR UPPER GI ENDOSCOPY,BIOPSY N/A 09/02/2017    Procedure: UGI ENDOSCOPY; WITH BIOPSY, SINGLE OR MULTIPLE;  Surgeon: Arnold Long Mir, MD;  Location: PEDS PROCEDURE ROOM [facility];  Service: Gastroenterology    PR UPPER GI ENDOSCOPY,BIOPSY N/A 03/13/2020    Procedure: UGI ENDOSCOPY; WITH BIOPSY, SINGLE OR MULTIPLE;  Surgeon: Helyn Numbers, MD;  Location: [facility] [place] [facility];  Service: Gastroenterology    PR UPPER GI ENDOSCOPY,BIOPSY N/A 09/05/2021    Procedure: UGI ENDOSCOPY; WITH BIOPSY, SINGLE OR MULTIPLE;  Surgeon: Wendall Papa, MD;  Location: GI PROCEDURES [facility];  Service: Gastroenterology    PR UPPER GI ENDOSCOPY,DIAGNOSIS N/A 01/20/2022    Procedure: UGI ENDO, INCLUDE ESOPHAGUS, STOMACH, & DUODENUM &/OR JEJUNUM; DX W/WO COLLECTION SPECIMN, BY BRUSH OR WASH;  Surgeon: Andrey Farmer, MD;  Location: GI PROC[facility]URES [facility];  Service: Gastroenterology    TUMOR REMOVAL      multiple-head, neck, back, hand, right flank, multiple    Family History   Problem Relation Age of Onset    No Known Problems Mother     No Known Problems Father No Known Problems Sister     No Known Problems Brother     Stroke Maternal Grandmother     Other Maternal Grandmother  benign lesions of liver and pancreas, further details unknown    Cancer Maternal Grandmother     Diabetes Maternal Grandmother     Hypertension Maternal Grandmother     Thyroid disease Maternal Grandmother     Arthritis Maternal Grandfather     Asthma Maternal Grandfather     COPD Paternal Grandmother         Deceased    Miscarriages / Stillbirths Paternal Grandmother     Alcohol abuse Paternal Grandfather         Deceased    No Known Problems Maternal Aunt     No Known Problems Maternal Uncle     No Known Problems Paternal Aunt     No Known Problems Paternal Uncle     Anesthesia problems Neg Hx     Broken bones Neg Hx     Cancer Neg Hx     Clotting disorder Neg Hx     Collagen disease Neg Hx     Diabetes Neg Hx     Dislocations Neg Hx     Fibromyalgia Neg Hx     Gout Neg Hx     Hemophilia Neg Hx     Osteoporosis Neg Hx     Rheumatologic disease Neg Hx     Scoliosis Neg Hx     Severe sprains Neg Hx     Sickle cell anemia Neg Hx     Spinal Compression Fracture Neg Hx     Melanoma Neg Hx     Basal cell carcinoma Neg Hx     Squamous cell carcinoma Neg Hx         Adhesive tape-silicones; Ferrlecit [sodium ferric gluconat-sucrose]; Levofloxacin; Methylnaltrexone; Neomycin; Papaya; Morphine; Zosyn [piperacillin-tazobactam]; Compazine [prochlorperazine]; Iron analogues; Reglan [metoclopramide hcl]; Iron dextran; and Latex, natural rubber     Objective Findings  Precautions / Restrictions  Falls precautions       Weight Bearing  Non-applicable    Required Braces or Orthoses  Non-applicable    Communication Preference  Verbal       Pain  Reports 7/10 abdomen area. RN aware, activities modified accordingly.    Equipment / Environment  Vascular access (PIV, TLC, Port-a-cath, PICC), Telemetry    Living Situation  Living Environment: House  Lives With: Father, Mother  Home Living: Multi-level home, Bed/bath upstairs, 1/2 bath on main level, Stairs to alternate level with rails, Stairs to enter without rails, Stairs to alternate level without rails, Handicapped height toilet, Tub/shower unit, Able to Live on main level with bedroom/bathroom  Number of Stairs to Enter (outside): 3  Rail placement (inside): Rail on right side  Number of Stairs to Alternate level (inside): 14  Caregiver Identified?: Yes  Caregiver Availability: 24 hours  Caregiver Ability: Limited lifting  Equipment available at home: Rollator, Wheelchair-manual, Rolling walker     Cognition   Orientation Level:  Oriented x 4   Arousal/Alertness:  Appropriate responses to stimuli   Attention Span:  Appears intact   Memory:  Appears intact   Following Commands:  Follows all commands and directions without difficulty   Safety Judgment:  Good awareness of safety precautions   Awareness of Errors and Problem Solving:  Patient self-corrected errors   Comments: Reports since her discharge from inpatient rehab she has been getting weaker    Vision / Hearing   Vision: No acute deficits identified     Hearing: No deficit identified       Hand Function:  Right Hand Function: Right hand  function impaired  Right Hand Impairment: grip strength fair  Left Hand Function: Left hand grip strength, ROM and coordination WNL  Hand Function comments: Grip strength is fair tumor right hand and wrist affecting performance  Hand Dominance: Right    Skin Inspection:  Skin Inspection: Intact where visualized    ROM / Strength:  UE ROM/Strength: Right Impaired/Limited, Left WFL  UE ROM/ Strength Comment: Tumor right scapula area impacting AROm for shoulder movts end of range. Generalized weakness BUE's  LE ROM/Strength: Right Impaired/Limited, Left Impaired/Limited  RLE Impairment: Reduced strength, Limited AROM  LLE Impairment: Reduced strength, Limited AROM  LE ROM/ Strength Comment: Limited hip ROM and strength. Generalized weakness biilateral lower extremities.    Coordination:  Coordination: WFL    Sensation:  Sensory/ Proprioception/ Stereognosis comments: Denies numbness and tingling. Does report neuropathy bilateral feet.    Balance:  Sitting Balance comments: Static sitting balance:WFL, dynamic sitting balance: supervision    Standing Balance comments: Static/dynamic standing balance: Min A with RW    Functional Mobility  Transfers:  (sit<>stand and functional transfers: Min A with RW)  Bed Mobility - Needs Assistance:  (supine<>sit: Mod I with HOB elevated)  Ambulation: Short household distnace ambulation: Min A with RW, unsteady gait and decreased gait speed. Reports increased dizziness requiring seated rest break after ambulating 83ft. Further mobility was deferred.    ADLs  ADLs: Needs assistance with ADLs  ADLs - Needs Assistance: Feeding, Grooming, Bathing, Toileting, UB dressing, LB dressing (fluctuating performance related to fatigue and dizziness)  Feeding - Needs Assistance: Set Up Assist  Grooming - Needs Assistance: Min assist, Physical assistance required  Bathing - Needs Assistance: Min assist, Physical assistance required, Mod assist  Toileting - Needs Assistance: Min assist, Physical assistance required  UB Dressing - Needs Assistance: Set Up Assist  LB Dressing - Needs Assistance: Min assist, Physical assistance required  IADLs: NT    Vitals / Orthostatics  Vitals/Orthostatics: Prior to session in supine: 116/77 bpm: 115, Seated EOB: 119/79(90), bpm:91    Patient at end of session: All needs in reach, In bed, Friends/Family present, Lines intact, Nurse notified     Occupational Therapy Session Duration  OT Individual [mins]: 10  OT Co-Treatment [mins]: 15 (with Dyanne Carrel, PT)  Reason for Co-treatment: To safely progress mobility, Poor activity tolerance       AM-PAC-Daily Activity  Lower Body Dressing assistance needs: A Little - Minimal/Contact Guard Assist/Supervision  Bathing assistance needs: A lot - Maximum/Moderate Assistance  Toileting assistance needs: A Little - Minimal/Contact Guard Assist/Supervision  Upper Body Dressing assistance needs: A Little - Minimal/Contact Guard Assist/Supervision  Personal Grooming assistance needs: A Little - Minimal/Contact Guard Assist/Supervision  Eating Meals assistance needs: A Little - Minimal/Contact Guard Assist/Supervision    Daily Activity Score: 17    Score (in points): % of Functional Impairment, Limitation, Restriction  6: 100% impaired, limited, restricted  7-8: At least 80%, but less than 100% impaired, limited restricted  9-13: At least 60%, but less than 80% impaired, limited restricted  14-19: At least 40%, but less than 60% impaired, limited restricted  20-22: At least 20%, but less than 40% impaired, limited restricted  23: At least 1%, but less than 20% impaired, limited restricted  24: 0% impaired, limited restricted    I attest that I have reviewed the above information.  Signed: Vania Rea, OT  Filed 03/27/2023

## 2023-03-28 NOTE — Unmapped (Addendum)
Hospital Medicine Daily Progress Note    Assessment/Plan:    Principal Problem:    Cancer associated pain  Active Problems:    [name]    Pain medication agreement signed    Pouchitis (CMS-HCC)   Malnutrition Evaluation as performed by RD, LDN: Severe Protein-Calorie Malnutrition in the context of chronic illness (03/27/23 1311)             [name] [name] is a 23 y.o. 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     Positive blood culture for Strep mitis  Blood cultures 03/25/23 (ordered by ED on presentation for workup of poor PO intake) growing Strep mitis in 1/2 sets. Unclear if this is true bacteremia or contaminant as she was afebrile, without leukocytosis or sepsis on admission. If true infection, low suspicion for odontogenic source given good dentition and regular dental care; GI source is consideration, but GI luminal team has low suspicion for recent pouchitis as source as inflammation was minimal on recent pouchoscopy, and she has been taking cefdinir since then). She was started on empiric vancomycin overnight and has been having Redman syndrome and itching, but no evidence of airway compromise.  Case discussed with ID; there is significant ceftriaxone-resistance in Strep mitis and patient with prior polymicrobial bacteremia in setting of TPN, so recommend treating with vanc and ertapenem for broad coverage initially. Stopped ertapenem when blood cultures shows no other bacteria at 72 hrs. Patient has very poor IV access with only a 22 gauge PIV in her left hand at this time. Plan for PICC but unable to get today as VAT schedule is backed up -- will likely take 2 days. Given her stability, we decided that the risk of central line placement tonight outweighs the benefit. Will monitor closely off antibiotics and plan to resume IV antibiotics for strep mitis after PICC placement.  - Follow up final blood cultures and Strep mitis sensitivities  - Repeat blood cultures for clearance thus far NGTD  - Appreciate ID recommendations  - DISCONTINUED IV VANC on 1/18 due to poor IV access with plan to resume pending PICC placement  - PICC order placed by VAT -- will hopefully get PICC on 1/20 due to scheduling  - S/p ertapenem 1/17-1/18, discontinued 1/18 given no other bacteria growing on blood cultures  - q4h vitals while off vancomycin for close monitoring    Hypoglycemia  BG 61-69 on 1/18.  - Q6h POC glucose checks  - D10W 25 ml/hr overnight -- if persistently low okay to increase  - Plan for corpak for enteral nutrition tomorrow    Poor PO intake / Nausea  Presyncopal episodes at home  Severe Protein-Calorie Malnutrition in the context of chronic illness (03/27/23 1311)  Energy Intake: < or equal to 75% of estimated energy requirement for > or equal to 1 month  Interpretation of Wt. Loss: > 7.5% x 3 month  Malnutrition Score: 2  Patient presented as she felt she needed to be back on TPN. This has been discussed with her primary outpatient providers including Dr. [name], Dr. Carmon [name], Dr. Meredith [name], and [name] her NP who do not see a current role for TPN at this time, especially in light of her new positive blood cultures that would preclude line placement. She notes poor PO intake but has been able to  keep down small amounts of crackers and liquid. She does not usually vomit but has felt very nauseated and feels that her chronic abdominal pain is worsened by eating. She currently does not feel able to take PO medications and requests IV alternatives where possible. She is hemodynamically stable, moist mucous membranes on exam. Labs extremely reassuring with normal electrolytes, albumin 3.7. Case discussed with GI today 1/18 who recommend Corpak placement for enteral nutrition.  - Plan for corpak for enteral nutrition 1/19  - Appreciate GI recommendations  - Appreciate dietician recommendations  - Zofran 8 mg IV q8h scheduled   - Phenergan 12.5 mg IV q6h PRN --> scheduled 1/18  - Continue home scopolamine patch  - Add Remeron 7.5 mg nightly on 1/18 for sleep and nausea, ok to hold if unable to tolerate po    Acute on chronic pain  In addition to nausea as above, patient presents due to uncontrolled abdominal pain, worse in the lower quadrants. No peritonitic signs on exam. Follows with chronic pain, see treatment plan from 1/15.  - Appreciate chronic pain recommendations  - Continue home butrans patch, scheduled tylenol  - Oxycodone liquid 20 mg q4h prn   - Continue Dilaudid 1 mg IV q4h prn severe breakthrough pain; do not escalate frequency without contacting chronic pain first. Would wean as soon as patient feels ready.     Recent mild pouchitis  Ongoing bright red blood per rectum  Patient with total abdominal colectomy with J pouch in 2022. Recent admission (12/29-1/10) for pouchitis, with pouchoscopy 03/13/23 showing minimal inflammation consistent with healing pouchitis. She has been taking cefdinir for planned 8 week course (300 mg BID 1/1-1/28, then 300 mg daily 1/29-2/25). She continues to have some BRBPR with each bowel movement and reports frequent watery stools. Her H/H stable on admission (Hgb 12.8) and VSS.   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 Meredith [name]. Patient has numerous sites of disease but most bothersome has been mesenteric sites which have caused chronic abdominal pain, nausea/vomiting. She presents at this time due to inadequately controlled abdominal pain and nausea, which have made it difficult for her to take in food or water. Please see Dr. Brynda Rim note from 1/14 addended 1/15 with his very thoughtful assessment of this situation and current hospitalization.  - Continue home nirogacestat as tolerated -- patient is also trying not taking it for a few days to see if it helps abdominal symptoms    Chronic Problems  PTSD  GAD  MDD  - Continue home lexapro  - Continue home ativan PRN     GERD  - Continue protonix, famotidine     Allergies  - Continue cetirizine     Checklist:  Diet: Regular Diet  DVT PPx: Ambulation  Code Status: Full Code  Dispo: anticipate discharge home vs AIR (based on PT/OT recs)    I personally spent 55 minutes face-to-face and non-face-to-face in the care of this patient, which includes all pre, intra, and post visit time on the date of service.  All documented time was specific to the E/M visit and does not include any procedures that may have been performed.  ___________________________________________________________________  Subjective:  No acute events overnight. Patient's IV dilaudid order fell off this morning; I reordered it while at the bedside. The patient's concerns are lack of IV access (PIVs keep blowing) and lack of nutrition leading to weakness and weight loss. We talked about corpak placement tomorrow AM. Started D10W today for hypoglycemia. Plan for PICC but unable to get today as VAT schedule is backed up -- will likely take 2 days. Unfortunately she lost the PIV that the vancomycin was running through so she is now off antibiotics pending PICC placement. Given her stability, we decided that the risk of central line placement tonight outweighs the benefit. Will monitor closely off antibiotics and plan to resume IV antibiotics for strep mitis after PICC placement.    Labs/Studies:  Labs and Studies from the last 24hrs per EMR and Reviewed    Objective:  Temp:  [36.3 ??C (97.3 ??F)-37.1 ??C (98.7 ??F)] 36.4 ??C (97.5 ??F)  Heart Rate:  [76-107] 99  SpO2 Pulse:  [82-102] 100  Resp:  [12-22] 16  BP: (101-128)/(63-87) 128/87  SpO2:  [95 %-100 %] 100 %    Gen: Sitting up in bed, non-toxic-appearing, mom at bedside  Eyes: EOMI, conjunctivae clear  HENT: MMM, no tongue swelling.   Pulm: Speaking in full sentences, nl WOB on RA  Abd: Soft, nondistended, diffusely tender  Ext: WWP, no edema or cyanosis  Skin: Patches of erythema over neck, chest, and abdomen without any wheels or hives noted.   Neuro: A&O x 3, no focal deficits  Psych: Appropriate affect

## 2023-03-28 NOTE — Unmapped (Signed)
Luminal Gastroenterology Consult Service   Progress Note         Assessment and Recommendations:   [name] [name] is a 23 y.o. female with a PMHx of [name] ([name]) s/p proctocolectomy w/ileoanal anastomosis in 2022, desmoid tumors (currently on nirogacestat, previously on sorafenib), anemia, previously on TPN and prolonged tube feeds, chronic pain, PTSD  who presented to [name] with recurrent abdominal pain, pre-syncope, hematochezia, weight loss. The patient is seen in consultation at the request of Arman Filter, MD ([name])) for  multiple GI complaints .    Acute on chronic abdominal pain - Poor oral intake  Patient has had longstanding concerns over nutritional status, has previously required TPN and enteral feeding. Patient reports intolerance to all oral intake, including oral medications because of severity of pain and nausea. Patient has previously had abdominal imaging without acute obstruction, no history of short bowel syndrome and no other prior structural abnormalities on EGD that would physiologically prevent patient from eating by mouth. Although patient has objectively lost weight (30lbs since ~summer), no evidence of low albumin, electrolyte derangements/AKI. Immediately goal in the next 24 hours would be to optimize IV formulations of anti-emetics and analgesics to hopefully break the cycle of acuity of symptoms, followed by more carefully addressing nutritional needs once accurately able to monitor oral intake.     - No acute indication for initiation of TPN, GI team to discuss this further with patient  - Pending optimization with medical therapy, would consider enteral feeding through nasogastric methods, not a candidate for PEG tube placement due to desmoid tumors  - Pending symptom trajectory, may consider inpatient EGD  - Agree with Nutrition and Pain Management consults  - Continue scheduled IV Zofran and IV promethazine for breakthrough nausea.  - Pain management per primary team    Hematochezia  She was recently hospitalized on the inpatient solid oncology service with hematochezia and c/f for recurrent pouchitis from 03/08/23 to 03/20/23. Pouchoscopy on 1/3 showed intact pouch with minimal inflammation. Low suspicion for active pouchitis or antimicrobial therapy failure contributing to current symptomatology. In addition, do not think that there is an ongoing GI source of bacteremia contributing. Will need to monitor clinical trajectory inpatient, low suspicion for clinically significant GIB given hemoglobin is within normal limits. Patient may have some hemorrhoidal bleeding contributing but low suspicion for inflammatory disorder of the pouch.     - Continue GIB precautions while inpatient with appropriate access, at least daily CBC, active T&S  - Asked patient to maintain stool diary with photodocumentation of hematochezia  - No acute indication for repeat pouchoscopy       Issues Impacting Complexity of Management:  -Discussed this patient's care with Dr. Barton Fanny and Britt Bolognese from the Gastroenterology service as summarized in this note    Recommendations discussed with the patient's primary team. We will continue to follow along with you.    History of Present Illness:     Interval events: Patient reports that her abdominal pain and nausea has not improved, mostly due to limitations while in ER regarding receiving medications on time. Overnight, patient had a reaction to vancomycin while receiving antimicrobial treatment for positive blood culuture (1/2 strep mitis). Unable to take any oral medications or food. Upon interviewing patient, she frequently mentions concern about malnutrition and need for supplemental feeding plan, preferably TPN.     -I have reviewed the patient's prior records from [name], [name] Nutrition, [name] Gastroenterology as summarized  in the HPI    Objective:   Temp:  [36.4 ??C (97.6 ??F)-37.1 ??C (98.7 ??F)] 36.5 ??C (97.7 ??F)  Heart Rate:  [78-107] 104  SpO2 Pulse:  [74-102] 100  Resp:  [10-27] 18  BP: (96-115)/(55-81) 112/75  SpO2:  [96 %-100 %] 96 %    Gen: WDWN female, anxious appearing, answers questions appropriately  Abdomen: Soft, diffuse tenderness to palpation, non-distended, no rebound/guarding, no hepatosplenomegaly. Surgical scars present and intact.   Extremities: No edema in the BLEs    Wt Readings from Last 12 Encounters:   03/27/23 51.1 kg (112 lb 9.6 oz)   03/24/23 52.8 kg (116 lb 6.4 oz)   03/17/23 53.5 kg (118 lb)   02/24/23 54.1 kg (119 lb 4.8 oz)   01/23/23 57.4 kg (126 lb 9.6 oz)   01/21/23 58 kg (127 lb 12.8 oz)   01/14/23 58 kg (127 lb 12.8 oz)   12/16/22 58.5 kg (129 lb)   12/16/22 58.5 kg (129 lb)   12/08/22 61.3 kg (135 lb 3.2 oz)   11/14/22 61 kg (134 lb 8 oz)   09/24/22 62.4 kg (137 lb 9.6 oz)        Pertinent Labs/Studies:  -I have reviewed the patient's labs from 03/27/23 which show stable Hgb and stable renal function (SCr, electrolytes) and 1/2 blood culture bottles positive for strep mitis.      Pouchoscopy biopsy:  A: Small bowel, pouch, biopsy  - Erosive moderately active chronic enteritis  - No CMV viral cytopathic effect, granuloma, or dysplasia identified     Pouchoscopy 1/3:   Intact pouch with minimal inflammation and several superficial ulcerations at the pouch anastamosis and pre-pouch inlet, suggestive of healing pouchitis.     Other scopes:  Pouch exam 07/2022 - normal ileum, rectal cuff appeared normal with the exception of small rectal polpys, which were treated with APC   Pouch exam 03/2022 - friable mucosa a ileoanal pouch suture line, treated with APC   Pouch exam 02/2022 - anal fissure, 10mm circumferential ulcer oozing at the ileocolonic anastomosis, treated with APC   Pouch exam 01/2022 - anal fissure, ulcer at ileocolonic anastomosis   EGD 01/2022 - multple polyps in the stomach, normal esophagus, duodenum and jejunum   EGD 08/2021- fundic gland polyps in the stomach, otherwise normal  EGD 2022- fundic gland polyps in the stomach, adenomatous tissue removed from small bowel on random biopsy to rule out celiac disease (none seen endoscopically)      01/23/2023 MRI A/P:  - Overall slightly increased size of the ill-defined enhancing soft tissue in the central lower mesentery extending into the pelvis/presacral region with associated small bowel tethering corresponding to desmoid tumor.   - Sequela of proctocolectomy and ileoanal anastomosis with grossly similar dilation of bowel proximal to the anastomosis.   -Three enhancing soft tissue lesions in the right rectus musculature, the largest of which is smaller in size compared to 11/11/2022.  - Slightly increased size of multiple prominent central mesenteric lymph nodes measuring up to 1.0 cm which may be reactive.  - Increased size of a nonenhancing 1.9 cm left interpolar parapelvic cyst with layering internal hemorrhagic debris.  - Marked narrowing of the left renal vein as it crosses between the SMA and aorta, correlate clinically for signs of nutcracker syndrome.  - Findings suggestive of iron deposition within the liver.  - Numerous cystic foci along the pelvic wall without definite enhancement as described in the body of the report, correlate with physical  examination.

## 2023-03-29 LAB — CBC
HEMATOCRIT: 35.3 % (ref 34.0–44.0)
HEMOGLOBIN: 11.9 g/dL (ref 11.3–14.9)
MEAN CORPUSCULAR HEMOGLOBIN CONC: 33.7 g/dL (ref 32.0–36.0)
MEAN CORPUSCULAR HEMOGLOBIN: 26.9 pg (ref 25.9–32.4)
MEAN CORPUSCULAR VOLUME: 79.9 fL (ref 77.6–95.7)
MEAN PLATELET VOLUME: 9.9 fL (ref 6.8–10.7)
PLATELET COUNT: 213 10*9/L (ref 150–450)
RED BLOOD CELL COUNT: 4.42 10*12/L (ref 3.95–5.13)
RED CELL DISTRIBUTION WIDTH: 16.1 % — ABNORMAL HIGH (ref 12.2–15.2)
WBC ADJUSTED: 8.2 10*9/L (ref 3.6–11.2)

## 2023-03-29 LAB — COMPREHENSIVE METABOLIC PANEL
ALBUMIN: 3.6 g/dL (ref 3.4–5.0)
ALKALINE PHOSPHATASE: 67 U/L (ref 46–116)
ALT (SGPT): 21 U/L (ref 10–49)
ANION GAP: 11 mmol/L (ref 5–14)
AST (SGOT): 20 U/L (ref <=34)
BILIRUBIN TOTAL: 0.6 mg/dL (ref 0.3–1.2)
BLOOD UREA NITROGEN: 6 mg/dL — ABNORMAL LOW (ref 9–23)
BUN / CREAT RATIO: 12
CALCIUM: 9.7 mg/dL (ref 8.7–10.4)
CHLORIDE: 103 mmol/L (ref 98–107)
CO2: 26 mmol/L (ref 20.0–31.0)
CREATININE: 0.51 mg/dL — ABNORMAL LOW (ref 0.55–1.02)
EGFR CKD-EPI (2021) FEMALE: >90 mL/min/{1.73_m2} (ref >=60)
GLUCOSE RANDOM: 103 mg/dL (ref 70–179)
POTASSIUM: 3.4 mmol/L (ref 3.4–4.8)
PROTEIN TOTAL: 6.6 g/dL (ref 5.7–8.2)
SODIUM: 140 mmol/L (ref 135–145)

## 2023-03-29 LAB — PHOSPHORUS: PHOSPHORUS: 3 mg/dL (ref 2.4–5.1)

## 2023-03-29 LAB — MAGNESIUM: MAGNESIUM: 1.8 mg/dL (ref 1.6–2.6)

## 2023-03-29 MED ADMIN — promethazine (PHENERGAN) 12.5 mg in sodium chloride (NS) 0.9 % 50 mL IVPB: 12.5 mg | INTRAVENOUS | @ 05:00:00

## 2023-03-29 MED ADMIN — ondansetron (ZOFRAN) injection 8 mg: 8 mg | INTRAVENOUS | @ 02:00:00

## 2023-03-29 MED ADMIN — promethazine (PHENERGAN) 12.5 mg in sodium chloride (NS) 0.9 % 50 mL IVPB: 12.5 mg | INTRAVENOUS | @ 12:00:00

## 2023-03-29 MED ADMIN — promethazine (PHENERGAN) 12.5 mg in sodium chloride (NS) 0.9 % 50 mL IVPB: 12.5 mg | INTRAVENOUS | @ 19:00:00

## 2023-03-29 MED ADMIN — HYDROmorphone (PF) (DILAUDID) injection 1 mg: 1 mg | INTRAVENOUS | @ 14:00:00 | Stop: 2023-04-11

## 2023-03-29 MED ADMIN — vancomycin (VANCOCIN) 750 mg in dextrose 5 % 150 mL IVPB (premix): 750 mg | INTRAVENOUS | @ 20:00:00 | Stop: 2023-04-08

## 2023-03-29 MED ADMIN — scopolamine (TRANSDERM-SCOP) 1 mg over 3 days topical patch 1 mg: 1 | TOPICAL | @ 14:00:00

## 2023-03-29 MED ADMIN — HYDROmorphone (PF) (DILAUDID) injection 1 mg: 1 mg | INTRAVENOUS | @ 10:00:00 | Stop: 2023-04-11

## 2023-03-29 MED ADMIN — lidocaine 2% gel (XYLOCAINE) jelly urojet 20 mL: 20 mL | URETHRAL | @ 21:00:00 | Stop: 2023-03-29

## 2023-03-29 MED ADMIN — sodium chloride (NS) 0.9 % flush 10 mL: 10 mL | INTRAVENOUS | @ 19:00:00

## 2023-03-29 MED ADMIN — HYDROmorphone (PF) (DILAUDID) injection 1 mg: 1 mg | INTRAVENOUS | @ 02:00:00 | Stop: 2023-04-11

## 2023-03-29 MED ADMIN — ondansetron (ZOFRAN) injection 8 mg: 8 mg | INTRAVENOUS | @ 10:00:00

## 2023-03-29 MED ADMIN — oxyCODONE (ROXICODONE) 5 mg/5 mL solution 20 mg: 20 mg | ORAL | @ 16:00:00 | Stop: 2023-04-09

## 2023-03-29 MED ADMIN — lidocaine (ASPERCREME) 4 % 1 patch: 1 | TRANSDERMAL | @ 14:00:00

## 2023-03-29 MED ADMIN — ondansetron (ZOFRAN) injection 8 mg: 8 mg | INTRAVENOUS | @ 19:00:00

## 2023-03-29 MED ADMIN — oxyCODONE (ROXICODONE) 5 mg/5 mL solution 20 mg: 20 mg | ORAL | @ 01:00:00 | Stop: 2023-04-09

## 2023-03-29 MED ADMIN — LORazepam (ATIVAN) tablet 1 mg: 1 mg | ORAL | @ 18:00:00

## 2023-03-29 MED ADMIN — HYDROmorphone (PF) (DILAUDID) injection 1 mg: 1 mg | INTRAVENOUS | @ 06:00:00 | Stop: 2023-04-11

## 2023-03-29 MED ADMIN — HYDROmorphone (PF) (DILAUDID) injection 1 mg: 1 mg | INTRAVENOUS | @ 19:00:00 | Stop: 2023-04-11

## 2023-03-29 MED ADMIN — diphenhydrAMINE (BENADRYL) injection: 50 mg | INTRAVENOUS | @ 21:00:00

## 2023-03-29 MED ADMIN — oxyCODONE (ROXICODONE) 5 mg/5 mL solution 20 mg: 20 mg | ORAL | @ 08:00:00 | Stop: 2023-04-09

## 2023-03-29 NOTE — Unmapped (Signed)
VSS.   Pt afebrile .  No falls noted. Pt educated to call for assistance if needed.. pt encouraged to increase PO intake.  Medicated for nausea  as scheduled.. Pain  controlled by  prn pain medication.Will continue to monitor.    Problem: Adult Inpatient Plan of Care  Goal: Plan of Care Review  Outcome: Progressing  Goal: Patient-Specific Goal (Individualized)  Outcome: Progressing  Goal: Absence of Hospital-Acquired Illness or Injury  Outcome: Progressing  Intervention: Identify and Manage Fall Risk  Recent Flowsheet Documentation  Taken 03/28/2023 2001 by Darene Lamer, RN  Safety Interventions:   infection management   family at bedside   low bed   nonskid shoes/slippers when out of bed  Intervention: Prevent Skin Injury  Recent Flowsheet Documentation  Taken 03/28/2023 2001 by Darene Lamer, RN  Positioning for Skin: Supine/Back  Device Skin Pressure Protection: absorbent pad utilized/changed  Skin Protection: adhesive use limited  Goal: Optimal Comfort and Wellbeing  Outcome: Progressing  Goal: Readiness for Transition of Care  Outcome: Progressing  Goal: Rounds/Family Conference  Outcome: Progressing     Problem: Infection  Goal: Absence of Infection Signs and Symptoms  Outcome: Progressing  Intervention: Prevent or Manage Infection  Recent Flowsheet Documentation  Taken 03/28/2023 2001 by Darene Lamer, RN  Isolation Precautions: protective precautions maintained     Problem: Latex Allergy  Goal: Absence of Allergy Symptoms  Outcome: Progressing  Intervention: Maintain Latex-Aware Environment  Recent Flowsheet Documentation  Taken 03/28/2023 2001 by Darene Lamer, RN  Latex Precautions: latex precautions maintained     Problem: Oncology Care  Goal: Effective Coping  Outcome: Progressing  Goal: Improved Activity Tolerance  Outcome: Progressing  Intervention: Promote Improved Energy  Recent Flowsheet Documentation  Taken 03/28/2023 2001 by Darene Lamer, RN  Activity Management: up ad lib  Goal: Optimal Oral Intake  Outcome: Progressing  Goal: Improved Oral Mucous Membrane Integrity  Outcome: Progressing  Intervention: Promote Oral Comfort and Health  Recent Flowsheet Documentation  Taken 03/28/2023 2001 by Darene Lamer, RN  Oral Mucous Membrane Protection: nonirritating oral fluids promoted  Goal: Optimal Pain Control and Function  Outcome: Progressing     Problem: Self-Care Deficit  Goal: Improved Ability to Complete Activities of Daily Living  Outcome: Progressing     Problem: Fall Injury Risk  Goal: Absence of Fall and Fall-Related Injury  Outcome: Progressing  Intervention: Promote Injury-Free Environment  Recent Flowsheet Documentation  Taken 03/28/2023 2001 by Darene Lamer, RN  Safety Interventions:   infection management   family at bedside   low bed   nonskid shoes/slippers when out of bed     Problem: Skin Injury Risk Increased  Goal: Skin Health and Integrity  Outcome: Progressing  Intervention: Optimize Skin Protection  Recent Flowsheet Documentation  Taken 03/28/2023 2001 by Darene Lamer, RN  Activity Management: up ad lib  Pressure Reduction Techniques: frequent weight shift encouraged  Pressure Reduction Devices: pressure-redistributing mattress utilized  Skin Protection: adhesive use limited     Problem: Pain Acute  Goal: Optimal Pain Control and Function  Outcome: Progressing

## 2023-03-29 NOTE — Unmapped (Signed)
Vancomycin Therapeutic Monitoring Pharmacy Note    [name] [name] is a 23 y.o. female starting vancomycin. Date of therapy initiation: 03/27/23-1/18, then r/s 1/19    Indication: Bacteremia/Sepsis    Prior Dosing Information: None/new initiation     Goals:  Therapeutic Drug Levels  Vancomycin trough goal: 10-15 mg/L    Additional Clinical Monitoring/Outcomes  Renal function, volume status (intake and output)    Results: Vancomycin level 10.7 mg/L, drawn appropriately    Wt Readings from Last 1 Encounters:   03/27/23 51.1 kg (112 lb 9.6 oz)     Creatinine   Date Value Ref Range Status   03/29/2023 0.51 (L) 0.55 - 1.02 mg/dL Final   16/12/9602 5.40 0.55 - 1.02 mg/dL Final   98/01/9146 8.29 0.55 - 1.02 mg/dL Final        Pharmacokinetic Considerations and Significant Drug Interactions:  Adult (calculated on 1/18): Vd = 38.44 L, ke = 0.1399 hr-1  Concurrent nephrotoxic meds: not applicable    Assessment/Plan:  Recommendation(s)  Restart 750mg  q8h   Estimated trough on recommended regimen:  11 mg/L    Follow-up  Level due: prior to fourth or fifth dose  A pharmacist will continue to monitor and order levels as appropriate    Please page service pharmacist with questions/clarifications.    Larinda Buttery, PharmD

## 2023-03-29 NOTE — Unmapped (Addendum)
VENOUS ACCESS TEAM PROCEDURE    Nurse request was placed for a PIV by Venous Access Team (VAT).  Patient was assessed at bedside for placement of a PIV. PPE were donned per protocol.  Access was obtained via ultrasound guidance. Blood return noted.  Dressing intact and device well secured.  Flushed with normal saline.  See LDA for details.  Pt advised to inform RN of any s/s of discomfort at the PIV site.    Workup / Procedure Time:  30 minutes       Primary RN was notified.       Thank you,     Georgette Dover, RN Venous Access Team

## 2023-03-29 NOTE — Unmapped (Signed)
Patient has very limited IV access and PICC cannot be placed for 2 days as PICC team is delayed.  One option would be IV daptomycin which would give [name] an additional 24 hours of coverage.  Strep mitis develops resistance rapidly to daptomycin but would be fine for 1-2 doses.  Another option would be oral linezolid.  Apparently patient has been refusing to take any p.o. antibiotics.  She has severe allergy to levofloxacin and vancomycin would likely blow existing 22 gauge IV.

## 2023-03-29 NOTE — Unmapped (Signed)
Luminal Gastroenterology Consult Service   Progress Note         Assessment and Recommendations:   [name] [name] is a 23 y.o. 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 The patient is seen in consultation at the request of [name], MD ([facility])) for  multiple GI complaints .    Acute on chronic abdominal pain - Poor oral intake  Patient has had longstanding concerns over nutritional status, has previously required TPN and enteral feeding. Patient reports intolerance to all oral intake, including oral medications because of severity of pain and nausea. Patient has previously had abdominal imaging without acute obstruction, no history of short bowel syndrome and no other prior structural abnormalities on EGD that would physiologically prevent patient from eating by mouth. Although patient has objectively lost weight (30lbs since ~summer), no evidence of low albumin, electrolyte derangements/AKI. Priority goal during early portion of hospitalization would be to optimize IV formulations of anti-emetics and analgesics to hopefully break the cycle of acuity of symptoms, followed by more carefully addressing nutritional needs once accurately able to monitor oral intake.     - No acute indication for initiation of TPN, GI has discussed with patient that this request has been declined for multiple reasons not limited to potential acute blood stream infection.  - Recommend placement of a Corpak. Patient reports previous intolerance to NG tube and requesting post-pyloric feeding which we will try to accommodate. Not a candidate for PEG tube placement due to desmoid tumors  - Pending symptom trajectory, may consider inpatient EGD  - Agree with Nutrition and Pain Management consults  - Continue scheduled IV Zofran and IV promethazine for breakthrough nausea.  - Pain management per primary team    Hematochezia  She was recently hospitalized on the inpatient solid oncology service with hematochezia and c/f for recurrent pouchitis from 03/08/23 to 03/20/23. Pouchoscopy on 1/3 showed intact pouch with minimal inflammation. Low suspicion for active pouchitis or antimicrobial therapy failure contributing to current symptomatology. In addition, do not think that there is an ongoing GI source of bacteremia contributing. Will need to monitor clinical trajectory inpatient, low suspicion for clinically significant GIB given hemoglobin is within normal limits. Patient may have some hemorrhoidal bleeding contributing but low suspicion for inflammatory disorder of the pouch.     - Continue GIB precautions while inpatient with appropriate access, at least daily CBC, active T&S  - Asked patient to maintain stool diary with photodocumentation of hematochezia  - No acute indication for repeat pouchoscopy       Issues Impacting Complexity of Management:  -Discussed this patient's care with Dr. Britt Bolognese from the Gastroenterology service as summarized in this note    Recommendations discussed with the patient's primary team. We will continue to follow along with you.    History of Present Illness:     Interval events: Patient reports that her abdominal pain and nausea has not improved. She continues to report presyncope, global weakness and fatigue that she states will only improve with further nutrition. Remains intolerant to all oral intake, including medications.       -I have reviewed the patient's prior records from [facility], [facility], [facility] Gastroenterology as summarized in the HPI  Objective:   Temp:  [36.3 ??C (97.3 ??F)-36.6 ??C (97.9 ??F)] 36.6 ??C (97.9 ??F)  Heart Rate:  [76-99] 91  Resp:  [16-18] 16  BP: (101-128)/(63-87) 111/76  SpO2:  [95 %-100 %] 100 %    Gen: WDWN female, anxious appearing, answers questions appropriately  Abdomen: Soft, diffuse tenderness to palpation, non-distended, no rebound/guarding, no hepatosplenomegaly. Surgical scars present and intact.   Extremities: No edema in the BLEs    Wt Readings from Last 12 Encounters:   03/27/23 51.1 kg (112 lb 9.6 oz)   03/24/23 52.8 kg (116 lb 6.4 oz)   03/17/23 53.5 kg (118 lb)   02/24/23 54.1 kg (119 lb 4.8 oz)   01/23/23 57.4 kg (126 lb 9.6 oz)   01/21/23 58 kg (127 lb 12.8 oz)   01/14/23 58 kg (127 lb 12.8 oz)   12/16/22 58.5 kg (129 lb)   12/16/22 58.5 kg (129 lb)   12/08/22 61.3 kg (135 lb 3.2 oz)   11/14/22 61 kg (134 lb 8 oz)   09/24/22 62.4 kg (137 lb 9.6 oz)        Pertinent Labs/Studies:  -I have reviewed the patient's labs from 03/28/23 which show stable Hgb and stable renal function (SCr, electrolytes) and 1/2 blood culture bottles positive for strep mitis.      Pouchoscopy biopsy:  A: Small bowel, pouch, biopsy  - Erosive moderately active chronic enteritis  - No CMV viral cytopathic effect, granuloma, or dysplasia identified     Pouchoscopy 1/3:   Intact pouch with minimal inflammation and several superficial ulcerations at the pouch anastamosis and pre-pouch inlet, suggestive of healing pouchitis.     Other scopes:  Pouch exam 07/2022 - normal ileum, rectal cuff appeared normal with the exception of small rectal polpys, which were treated with APC   Pouch exam 03/2022 - friable mucosa a ileoanal pouch suture line, treated with APC   Pouch exam 02/2022 - anal fissure, 10mm circumferential ulcer oozing at the ileocolonic anastomosis, treated with APC   Pouch exam 01/2022 - anal fissure, ulcer at ileocolonic anastomosis   EGD 01/2022 - multple polyps in the stomach, normal esophagus, duodenum and jejunum   EGD 08/2021- fundic gland polyps in the stomach, otherwise normal  EGD 2022- fundic gland polyps in the stomach, adenomatous tissue removed from small bowel on random biopsy to rule out celiac disease (none seen endoscopically)      01/23/2023 MRI A/P:  - Overall slightly increased size of the ill-defined enhancing soft tissue in the central lower mesentery extending into the pelvis/presacral region with associated small bowel tethering corresponding to desmoid tumor.   - Sequela of proctocolectomy and ileoanal anastomosis with grossly similar dilation of bowel proximal to the anastomosis.   -Three enhancing soft tissue lesions in the right rectus musculature, the largest of which is smaller in size compared to 11/11/2022.  - Slightly increased size of multiple prominent central mesenteric lymph nodes measuring up to 1.0 cm which may be reactive.  - Increased size of a nonenhancing 1.9 cm left interpolar parapelvic cyst with layering internal hemorrhagic debris.  - Marked narrowing of the left renal vein as it crosses between the SMA and aorta, correlate clinically for signs of nutcracker syndrome.  - Findings suggestive of iron deposition within the liver.  - Numerous cystic foci along the pelvic wall without definite enhancement as described in the body of the report, correlate with physical examination.

## 2023-03-29 NOTE — Unmapped (Addendum)
Hospital Medicine Daily Progress Note    Assessment/Plan:    Principal Problem:    Cancer associated pain  Active Problems:    [name]    Pain medication agreement signed    Pouchitis (CMS-HCC)   Malnutrition Evaluation as performed by RD, LDN: Severe Protein-Calorie Malnutrition in the context of chronic illness (03/27/23 1311)             [name] [name] is a 23 y.o. female with history of [name] ([name]) s/p proctocolectomy with J-pouch with recent episode of mild pouchitis, cutaneous desmoid tumors, chronic complex pain, prior TPN dependence that presented to [facility] with Cancer associated pain and nausea.     Positive blood culture for Strep mitis  Blood cultures 03/25/23 (ordered by ED on presentation for workup of poor PO intake) growing Strep mitis in 1/2 sets. Unclear if this is true bacteremia or contaminant as she was afebrile, without leukocytosis or sepsis on admission. If true infection, low suspicion for odontogenic source given good dentition and regular dental care; GI source is consideration, but GI luminal team has low suspicion for recent pouchitis as source as inflammation was minimal on recent pouchoscopy, and she has been taking cefdinir since then. She was started on empiric vancomycin c/b Redman syndrome and itching, but no evidence of airway compromise.  Case discussed with ID; there is significant ceftriaxone-resistance in Strep mitis and patient with prior polymicrobial bacteremia in setting of TPN, so recommend treating with vanc and ertapenem for broad coverage initially. Stopped ertapenem when blood cultures showed no other bacteria at 72 hrs. Patient was off vancomycin on 1/18-1/19 due to no IV access. Vancomycin was resumed 1/19.  - Follow up final blood cultures and Strep mitis sensitivities  - Repeat blood cultures for clearance thus far NGTD  - Resumed Vancomycin on 1/19 per ID recs pending final cxs  - PICC line placed in left arm on 1/19  - S/p ertapenem 1/17-1/18, discontinued 1/18 given no other bacteria growing on blood cultures    Hypoglycemia  BG 61-69 on 1/18.  - Q6h POC glucose checks  - D10W 25 ml/hr overnight  - Plan for enteral nutrition to start today    Poor PO intake / Nausea  Presyncopal episodes at home  Severe Protein-Calorie Malnutrition in the context of chronic illness (03/27/23 1311)  Energy Intake: < or equal to 75% of estimated energy requirement for > or equal to 1 month  Interpretation of Wt. Loss: > 7.5% x 3 month  Malnutrition Score: 2  Patient presented as she felt she needed to be back on TPN. This has been discussed with her primary outpatient providers including Dr. [name], Dr. Carmon Sails, Dr. [name], and [name] her NP who do not see a current role for TPN at this time, especially in light of her new positive blood cultures that would preclude line placement. She notes poor PO intake but has been able to keep down small amounts of crackers and liquid. She does not usually vomit but has felt very nauseated and feels that her chronic abdominal pain is worsened by eating. She currently does not feel able to take PO medications and requests IV alternatives where possible. She is hemodynamically stable, moist mucous membranes on exam. Labs extremely reassuring with normal electrolytes, albumin 3.7. Case discussed with GI today 1/18 who recommend Corpak placement for enteral nutrition.  - Corpak placed 1/19  - Start enteral nutrition Osmolite 1.5 per nutrition recs  - Appreciate GI recommendations  -  Appreciate dietician recommendations  - Zofran 8 mg IV q8h scheduled   - Phenergan 12.5 mg IV q6h PRN --> scheduled 1/18  - Continue home scopolamine patch  - Added Remeron 7.5 mg nightly on 1/18 for sleep and nausea, ok to hold if unable to tolerate po    Acute on chronic pain  In addition to nausea as above, patient presents due to uncontrolled abdominal pain, worse in the lower quadrants. No peritonitic signs on exam. Follows with chronic pain, see treatment plan from 1/15.  - Appreciate chronic pain recommendations  - Continue home butrans patch, scheduled tylenol  - Oxycodone liquid 20 mg q4h prn   - Continue Dilaudid 1 mg IV q4h prn severe breakthrough pain; do not escalate frequency without contacting chronic pain first. Would wean as soon as patient feels ready.     Recent mild pouchitis  Ongoing bright red blood per rectum  Patient with total abdominal colectomy with J pouch in 2022. Recent admission (12/29-1/10) for pouchitis, with pouchoscopy 03/13/23 showing minimal inflammation consistent with healing pouchitis. She has been taking cefdinir for planned 8 week course (300 mg BID 1/1-1/28, then 300 mg daily 1/29-2/25). She continues to have some BRBPR with each bowel movement and reports frequent watery stools. Her H/H stable on admission (Hgb 12.8) and VSS.   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 [name]. Patient has numerous sites of disease but most bothersome has been mesenteric sites which have caused chronic abdominal pain, nausea/vomiting. She presents at this time due to inadequately controlled abdominal pain and nausea, which have made it difficult for her to take in food or water. Please see Dr. Brynda Rim note from 1/14 addended 1/15 with his very thoughtful assessment of this situation and current hospitalization.  - Continue home nirogacestat as tolerated -- patient is also trying not taking it for a few days to see if it helps abdominal symptoms    Chronic Problems  PTSD  GAD  MDD  - Continue home lexapro  - Continue home ativan PRN     GERD  - Continue protonix, famotidine     Allergies  - Continue cetirizine     Checklist:  Diet: Regular Diet  DVT PPx: Ambulation  Code Status: Full Code  Dispo: anticipate discharge home vs AIR (based on PT/OT recs)    I personally spent 55 minutes face-to-face and non-face-to-face in the care of this patient, which includes all pre, intra, and post visit time on the date of service.  All documented time was specific to the E/M visit and does not include any procedures that may have been performed.  ___________________________________________________________________    Subjective:  No acute events overnight. Patient had left arm PICC placed today and later in the afternoon I placed a corpak (CXR pending).     Labs/Studies:  Labs and Studies from the last 24hrs per EMR and Reviewed    Objective:  Temp:  [36.4 ??C (97.5 ??F)-36.8 ??C (98.2 ??F)] 36.6 ??C (97.9 ??F)  Heart Rate:  [71-99] 85  Resp:  [16] 16  BP: (104-128)/(59-87) 117/71  SpO2:  [94 %-100 %] 100 %    Gen: Sitting up in bed, non-toxic-appearing,  mom at bedside  Eyes: appropriate eye contact, conjunctivae clear  HENT: MMM, normal appearing nose, corpak in left nare  Pulm: Speaking in full sentences, nl WOB on RA  Abd: Soft, nondistended, diffusely tender  Ext: WWP, no edema or cyanosis  Skin: Patches of erythema over neck, chest, and abdomen without any wheels or hives noted.   Neuro: A&O x 3, no focal deficits, conversant  Psych: Appropriate affect

## 2023-03-29 NOTE — Unmapped (Signed)
PICC LINE INSERTION PROCEDURE NOTE    Indications:  Antibiotic Therapy    Consent/Time Out:    Risks, benefits and alternatives discussed with patient.  Written Consent was obtained prior to the procedure and is detailed in the medical record.  Prior to the start of the procedure, a time out was taken and the identity of the patient was confirmed via name, medical record number and  date of birth.  The availability of the correct equipment was verified.    Procedure Details:  The vein was identified and measured for appropriate catheter length. Maximum sterile techniques including PPE were utilized per protocol.  Sterile field prepared with necessary supplies and equipment. Insertion site was prepped with chlorhexidine solution and allowed to dry. Lidocaine 2 mL subcutaneously and intradermally administered to insertion site.  The catheter was primed with normal saline.  A 4 FR Single lumen was inserted to the R Basilic vein with 2 insertion attempt(s).  Catheter aspirated, 5 mL blood return present.  The catheter was then flushed with 10 mL of normal saline.   Insertion site cleansed, and sterile dressing applied per manufacturer guidelines. The Central Line Checklist was referenced.  The catheter was inserted with difficulty accessing vessel, future bedside PICC's will be extremely difficult  by Lorelle Formosa, RN, [facility] and assisted by Marylee Floras, RN.    Findings:  Manufacturer:  [employer]  Lot #:  [id number]  CT Injectable (power):  Yes  Arm Circumference:  26  cm  Total catheter length:  35 cm.    External length:  0 cm.  Catheter trimmed:  yes  Port reserved:  No    Vein Size:   Left arm brachial vein compressible:  Yes, measurement:  see image    Tip Placement Verification:   [employer] and Sherlock    Workup Time:    90 minutes    Recommendations:  PICC brochure given to patient with teaching instructions  Refer to head of bed sign      See images below:

## 2023-03-29 NOTE — Unmapped (Signed)
Patient remained alert and oriented during shift. Patient still not tolerating anything PO but was able to keep down PO oxy and PO ativan. Patient refused all other PO medications and only had a peppermint and some saltine cracker pieces to eat during day. Patients blood glucose is running low. MD aware and ordered q6 BS checks along with D10 continuous infusion. Patients IV in right forearm was lost during vanc infusion and infusion was stopped and MD was notified. Vanc was discontinued. We continue to run IV medications through PIV in patients left wrist slowly in hopes to protect vein until PICC line can be placed. MD currently coming up with a IV antibiotic alternative plan until PICC line is placed. Patient had pain and nausea that is well controlled through current regimen of prn medication. Patient had significant anxiety that was managed through conversation and prn medications. Patient currently calm and cooperative. Patient had no reaction to small course of vanc received today.  Problem: Adult Inpatient Plan of Care  Goal: Plan of Care Review  Outcome: Progressing  Flowsheets (Taken 03/28/2023 1731)  Progress: improving  Plan of Care Reviewed With:   patient   parent  Goal: Absence of Hospital-Acquired Illness or Injury  Outcome: Progressing  Intervention: Identify and Manage Fall Risk  Recent Flowsheet Documentation  Taken 03/28/2023 1600 by Corey Harold, RN  Safety Interventions:   bed alarm   fall reduction program maintained   lighting adjusted for tasks/safety   low bed  Taken 03/28/2023 1400 by Corey Harold, RN  Safety Interventions:   bed alarm   fall reduction program maintained   lighting adjusted for tasks/safety   low bed  Taken 03/28/2023 1200 by Corey Harold, RN  Safety Interventions:   bed alarm   fall reduction program maintained   lighting adjusted for tasks/safety   low bed  Taken 03/28/2023 1000 by Corey Harold, RN  Safety Interventions:   family at bedside   fall reduction program maintained   lighting adjusted for tasks/safety   low bed  Taken 03/28/2023 0800 by Corey Harold, RN  Safety Interventions:   fall reduction program maintained   family at bedside   lighting adjusted for tasks/safety   low bed  Intervention: Prevent Skin Injury  Recent Flowsheet Documentation  Taken 03/28/2023 1115 by Corey Harold, RN  Positioning for Skin: Supine/Back  Device Skin Pressure Protection: absorbent pad utilized/changed  Skin Protection: adhesive use limited  Intervention: Prevent Infection  Recent Flowsheet Documentation  Taken 03/28/2023 1600 by Corey Harold, RN  Infection Prevention: cohorting utilized  Taken 03/28/2023 1400 by Corey Harold, RN  Infection Prevention: cohorting utilized  Taken 03/28/2023 1200 by Corey Harold, RN  Infection Prevention: cohorting utilized  Taken 03/28/2023 1000 by Corey Harold, RN  Infection Prevention: cohorting utilized  Taken 03/28/2023 0800 by Corey Harold, RN  Infection Prevention: cohorting utilized  Goal: Optimal Comfort and Wellbeing  Outcome: Progressing  Goal: Readiness for Transition of Care  Outcome: Progressing     Problem: Infection  Goal: Absence of Infection Signs and Symptoms  Outcome: Progressing  Intervention: Prevent or Manage Infection  Recent Flowsheet Documentation  Taken 03/28/2023 1600 by Corey Harold, RN  Infection Management: aseptic technique maintained  Isolation Precautions: enteric precautions maintained  Taken 03/28/2023 1400 by Corey Harold, RN  Infection Management: aseptic technique maintained  Isolation Precautions: enteric precautions maintained  Taken 03/28/2023 1200 by Corey Harold, RN  Infection Management: aseptic technique maintained  Isolation Precautions: enteric precautions maintained  Taken 03/28/2023 1000 by  Corey Harold, RN  Infection Management: aseptic technique maintained  Isolation Precautions: enteric precautions discontinued  Taken 03/28/2023 0800 by Corey Harold, RN  Infection Management: aseptic technique maintained     Problem: Latex Allergy  Goal: Absence of Allergy Symptoms  Outcome: Progressing  Intervention: Maintain Latex-Aware Environment  Recent Flowsheet Documentation  Taken 03/28/2023 1600 by Corey Harold, RN  Latex Precautions: latex precautions maintained  Taken 03/28/2023 1400 by Corey Harold, RN  Latex Precautions: latex precautions maintained  Taken 03/28/2023 1000 by Corey Harold, RN  Latex Precautions: latex precautions maintained  Taken 03/28/2023 0800 by Trinna Post, Mihira Tozzi, RN  Latex Precautions: latex precautions maintained     Problem: Oncology Care  Goal: Effective Coping  Outcome: Progressing  Goal: Improved Activity Tolerance  Outcome: Progressing  Intervention: Promote Improved Energy  Recent Flowsheet Documentation  Taken 03/28/2023 1115 by Corey Harold, RN  Fatigue Management: activity schedule adjusted  Goal: Optimal Oral Intake  Outcome: Progressing  Goal: Improved Oral Mucous Membrane Integrity  Outcome: Progressing  Intervention: Promote Oral Comfort and Health  Recent Flowsheet Documentation  Taken 03/28/2023 1115 by Corey Harold, RN  Oral Mucous Membrane Protection: nonirritating oral fluids promoted  Goal: Optimal Pain Control and Function  Outcome: Progressing     Problem: Self-Care Deficit  Goal: Improved Ability to Complete Activities of Daily Living  Outcome: Progressing  Intervention: Promote Activity and Functional Independence  Recent Flowsheet Documentation  Taken 03/28/2023 1115 by Corey Harold, RN  Self-Care Promotion: independence encouraged     Problem: Fall Injury Risk  Goal: Absence of Fall and Fall-Related Injury  Outcome: Progressing  Intervention: Identify and Manage Contributors  Recent Flowsheet Documentation  Taken 03/28/2023 1115 by Corey Harold, RN  Self-Care Promotion: independence encouraged  Intervention: Promote Injury-Free Environment  Recent Flowsheet Documentation  Taken 03/28/2023 1600 by Corey Harold, RN  Safety Interventions:   bed alarm   fall reduction program maintained   lighting adjusted for tasks/safety   low bed  Taken 03/28/2023 1400 by Corey Harold, RN  Safety Interventions:   bed alarm   fall reduction program maintained   lighting adjusted for tasks/safety   low bed  Taken 03/28/2023 1200 by Corey Harold, RN  Safety Interventions:   bed alarm   fall reduction program maintained   lighting adjusted for tasks/safety   low bed  Taken 03/28/2023 1000 by Corey Harold, RN  Safety Interventions:   family at bedside   fall reduction program maintained   lighting adjusted for tasks/safety   low bed  Taken 03/28/2023 0800 by Corey Harold, RN  Safety Interventions:   fall reduction program maintained   family at bedside   lighting adjusted for tasks/safety   low bed     Problem: Skin Injury Risk Increased  Goal: Skin Health and Integrity  Outcome: Progressing  Intervention: Optimize Skin Protection  Recent Flowsheet Documentation  Taken 03/28/2023 1115 by Corey Harold, RN  Pressure Reduction Techniques: frequent weight shift encouraged  Pressure Reduction Devices: alternating pressure pump (APP)  Skin Protection: adhesive use limited

## 2023-03-30 LAB — COMPREHENSIVE METABOLIC PANEL
ALBUMIN: 3.7 g/dL (ref 3.4–5.0)
ALKALINE PHOSPHATASE: 106 U/L (ref 46–116)
ALT (SGPT): 59 U/L — ABNORMAL HIGH (ref 10–49)
ANION GAP: 9 mmol/L (ref 5–14)
AST (SGOT): 48 U/L — ABNORMAL HIGH (ref <=34)
BILIRUBIN TOTAL: 0.5 mg/dL (ref 0.3–1.2)
BLOOD UREA NITROGEN: 5 mg/dL — ABNORMAL LOW (ref 9–23)
BUN / CREAT RATIO: 9
CALCIUM: 9.9 mg/dL (ref 8.7–10.4)
CHLORIDE: 103 mmol/L (ref 98–107)
CO2: 30 mmol/L (ref 20.0–31.0)
CREATININE: 0.54 mg/dL — ABNORMAL LOW (ref 0.55–1.02)
EGFR CKD-EPI (2021) FEMALE: >90 mL/min/{1.73_m2} (ref >=60)
GLUCOSE RANDOM: 121 mg/dL (ref 70–179)
POTASSIUM: 3.8 mmol/L (ref 3.4–4.8)
PROTEIN TOTAL: 6.9 g/dL (ref 5.7–8.2)
SODIUM: 142 mmol/L (ref 135–145)

## 2023-03-30 LAB — CBC
HEMATOCRIT: 37.3 % (ref 34.0–44.0)
HEMOGLOBIN: 12.6 g/dL (ref 11.3–14.9)
MEAN CORPUSCULAR HEMOGLOBIN CONC: 33.7 g/dL (ref 32.0–36.0)
MEAN CORPUSCULAR HEMOGLOBIN: 26.9 pg (ref 25.9–32.4)
MEAN CORPUSCULAR VOLUME: 79.8 fL (ref 77.6–95.7)
MEAN PLATELET VOLUME: 9.8 fL (ref 6.8–10.7)
PLATELET COUNT: 217 10*9/L (ref 150–450)
RED BLOOD CELL COUNT: 4.68 10*12/L (ref 3.95–5.13)
RED CELL DISTRIBUTION WIDTH: 16.2 % — ABNORMAL HIGH (ref 12.2–15.2)
WBC ADJUSTED: 9.2 10*9/L (ref 3.6–11.2)

## 2023-03-30 LAB — VANCOMYCIN, TROUGH: VANCOMYCIN TROUGH: 10.9 ug/mL (ref 10.0–20.0)

## 2023-03-30 MED ADMIN — HYDROmorphone (PF) (DILAUDID) injection 1 mg: 1 mg | INTRAVENOUS | @ 05:00:00 | Stop: 2023-03-30

## 2023-03-30 MED ADMIN — oxyCODONE (ROXICODONE) 5 mg/5 mL solution 20 mg: 20 mg | ORAL | @ 22:00:00 | Stop: 2023-04-09

## 2023-03-30 MED ADMIN — ondansetron (ZOFRAN) injection 8 mg: 8 mg | INTRAVENOUS | @ 19:00:00

## 2023-03-30 MED ADMIN — sodium chloride (NS) 0.9 % flush 10 mL: 10 mL | INTRAVENOUS | @ 12:00:00

## 2023-03-30 MED ADMIN — ondansetron (ZOFRAN) injection 8 mg: 8 mg | INTRAVENOUS | @ 03:00:00

## 2023-03-30 MED ADMIN — oxyCODONE (ROXICODONE) 5 mg/5 mL solution 20 mg: 20 mg | ORAL | @ 16:00:00 | Stop: 2023-04-09

## 2023-03-30 MED ADMIN — lidocaine (ASPERCREME) 4 % 1 patch: 1 | TRANSDERMAL | @ 14:00:00

## 2023-03-30 MED ADMIN — sodium chloride (NS) 0.9 % flush 10 mL: 10 mL | INTRAVENOUS | @ 19:00:00

## 2023-03-30 MED ADMIN — promethazine (PHENERGAN) 12.5 mg in sodium chloride (NS) 0.9 % 50 mL IVPB: 12.5 mg | INTRAVENOUS | @ 05:00:00

## 2023-03-30 MED ADMIN — ammonium lactate (LAC-HYDRIN) 12 % lotion 1 Application: 1 | TOPICAL | @ 19:00:00

## 2023-03-30 MED ADMIN — diphenhydrAMINE (BENADRYL) injection: 50 mg | INTRAVENOUS | @ 14:00:00

## 2023-03-30 MED ADMIN — promethazine (PHENERGAN) 12.5 mg in sodium chloride (NS) 0.9 % 50 mL IVPB: 12.5 mg | INTRAVENOUS

## 2023-03-30 MED ADMIN — HYDROmorphone (PF) (DILAUDID) injection 1 mg: 1 mg | INTRAVENOUS | @ 18:00:00 | Stop: 2023-04-11

## 2023-03-30 MED ADMIN — vancomycin (VANCOCIN) 750 mg in dextrose 5 % 150 mL IVPB (premix): 750 mg | INTRAVENOUS | @ 12:00:00 | Stop: 2023-04-08

## 2023-03-30 MED ADMIN — HYDROmorphone (PF) (DILAUDID) injection 1 mg: 1 mg | INTRAVENOUS | @ 11:00:00 | Stop: 2023-03-30

## 2023-03-30 MED ADMIN — HYDROmorphone (PF) (DILAUDID) injection 1 mg: 1 mg | INTRAVENOUS | Stop: 2023-04-11

## 2023-03-30 MED ADMIN — promethazine (PHENERGAN) 12.5 mg in sodium chloride (NS) 0.9 % 50 mL IVPB: 12.5 mg | INTRAVENOUS | @ 12:00:00

## 2023-03-30 MED ADMIN — promethazine (PHENERGAN) 12.5 mg in sodium chloride (NS) 0.9 % 50 mL IVPB: 12.5 mg | INTRAVENOUS | @ 17:00:00 | Stop: 2023-03-30

## 2023-03-30 MED ADMIN — diphenhydrAMINE (BENADRYL) injection: 50 mg | INTRAVENOUS | @ 03:00:00

## 2023-03-30 MED ADMIN — promethazine (PHENERGAN) 12.5 mg in sodium chloride (NS) 0.9 % 50 mL IVPB: 12.5 mg | INTRAVENOUS | @ 23:00:00

## 2023-03-30 MED ADMIN — vancomycin (VANCOCIN) 750 mg in dextrose 5 % 150 mL IVPB (premix): 750 mg | INTRAVENOUS | @ 21:00:00 | Stop: 2023-04-08

## 2023-03-30 MED ADMIN — vancomycin (VANCOCIN) 750 mg in dextrose 5 % 150 mL IVPB (premix): 750 mg | INTRAVENOUS | @ 03:00:00 | Stop: 2023-04-08

## 2023-03-30 MED ADMIN — ondansetron (ZOFRAN) injection 8 mg: 8 mg | INTRAVENOUS | @ 11:00:00

## 2023-03-30 MED ADMIN — sodium chloride (NS) 0.9 % flush 10 mL: 10 mL | INTRAVENOUS | @ 03:00:00

## 2023-03-30 NOTE — Unmapped (Signed)
Division of Infectious Diseases  General Inpatient Consultation Service       For questions about this consult, page [phone] (Gen A Follow-up Pager).      [name] [name] is being seen in consultation at the request of [name] Ahmadi, MD for evaluation and management of strep mitis bacteremia.       PLAN FOR 03/30/2023      It is hard to dismiss strep mitis as a contaminant here given compromised tissue planes with [name], desmoid tumors, and recent pouchitis.  Per [facility] antibiogram, around 20% of strep mitis isolates are resistant to ceftriaxone and 60% are resistant to penicillin G.  100% of isolates are vancomycin susceptible.  We will have micro lab add on susceptibilities to this isolate.      Diagnostic  Follow-up blood culture 1/15, 1/17  Follow-up strep mitis AST  Monitor for antimicrobial toxicity with the following:  CBC w/diff at least once per week  BMP at least once per week  clinical assessments for rashes or other skin changes  vancomycin trough levels (goal 10-15; at least weekly once stable)    Treatment  Continue vancomycin (for strep mitis) dosing per pharmacy  Recommend extended vancomycin infusion and benadryl pre-medication for vancomycin infusion syndrome  Duration of therapy = likely 7 days for strep mitis bacteremia from first negative blood culture  start date =  1/17  end date = 1/23    I discussed the plans for today with primary team on 03/30/2023.    Our service will continue to follow.    I personally spent 45 mins face-to-face and non-face-to-face in the care of this patient on 03/30/2023, which includes all pre, intra, and post visit time on the date of service.  All documented time was specific to the E/M visit and does not include any procedures that may have been performed.    Care for a suspected or confirmed infection was provided by an ID specialist in this encounter. [phone])    [name] Coffee, MD  [facility] Division of Infectious Diseases               MDM and Problem-Specific Assessments  ( .00ID2DAY  /  .32GMWNUUVOZD  /  .IDSS  / .[name] )     23 y.o. 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 Unclear if this is a contaminant or not but strep mitis, but given compromised tissue planes, we recommended treating for one week. Patient had vancomycin infusion syndrome, so recommending slow infusion and benadryl.    March 30, 2023   Patient has: []  acute illness w/systemic sxs  [mod] [x]  illness posing risk to life or function  [high]   I reviewed:   (3+) [x]  primary team note [x]  consultant note(s) []  procedure/op note(s) [x]  micro result(s)    [x]  CBC results [x]  chemistry results [x]  radiology report(s) []  nursing note(s)   I independently visualized:   (any)   []  cxs/plates in lab []  plain film images []  CT images []  PET images    []  path slide(s) []  ECG tracing []  MRI images []  nuclear scan   I discussed: (any) []  micro and/or path w/lab personnel []  drug options and/or interactions w/ID pharmD    []  procedure/OR findings w/other MD(s) []  echo and/or imaging w/other MD(s)    [x]  mgm't  w/attending(s) involved in case []  setting up home abx w/OPAT team   Mgm't requires: []  prescription drug(s)  [mod] [x]  intensive toxicity monitoring  [high]     Interval notes & result reports show: Afebrile. VSS on RA. No acute events overnight.     Patient reports: Endorses some continued nausea and difficulties with po intake.     My interpretation of the data I reviewed is: WBC 9.2 WNL. Cr 0.54. 1/19 XR AB shows weighted enteric tube tip projects over the second portion of the duodenum. 1/15 Bcx 1/2 strep mitis. 1/17 Bcx NGTD.         # Strep mitis bacteremia  - acute, poses threat to life or bodily function  [high]  Recommendations per blue box      # Pouchitis - acute, undx'd new problem w/uncertain prognosis  [mod]  Recommendations per blue box    # Management of prescription antimicrobials needing intensive toxicity monitoring - acute, poses threat to life or bodily function  [high]  Beta-lactams (penicillins, cephalosporins, and carbapenems) can cause rashes, loose stools, nephrotoxicity, and/or myelosuppression.  Vancomycin can cause back pain, various dermatological effects, thrombocytopenia, neutropenia, and/or nephrotoxicity.   See recommendations in blue box above.      # Disposition  TBD             Antimicrobials & Other Medications  ( .00IDGANTT  /  .00IDGANTTLIST  )     Current  Vancomycin 1/16-    Previous  Cefdinir  Ceftriaxone 1/17    Immunomodulators and antipyretics  None       Current Medications as of 03/30/2023  Scheduled  PRN   acetaminophen, 1,000 mg, Q8H  buprenorphine, 1 patch, Weekly  cetirizine, 20 mg, BID  cholecalciferol (vitamin D3 25 mcg (1,000 units)), 25 mcg, Daily  diphenhydrAMINE, 50 mg, Q8H  escitalopram oxalate, 5 mg, Daily  famotidine, 20 mg, Nightly  fluticasone propionate, 1 spray, Weekly  lidocaine, 1 patch, Daily  mirtazapine, 7.5 mg, Nightly  multivitamins (ADULT), 1 tablet, Daily  nirogacestat, 150 mg, BID  ondansetron, 8 mg, Q8H  pantoprazole, 40 mg, Daily before breakfast  promethazine, 12.5 mg, Q6H  scopolamine, 1 patch, Q72H  sodium chloride, 10 mL, Q8H  thiamine mononitrate (vit B1), 200 mg, Daily  vancomycin, 750 mg, Q8H      diphenhydrAMINE, 50 mg, Daily PRN  hydrocortisone, , BID PRN  HYDROmorphone, 1 mg, Q4H PRN  IP okay to treat, , Continuous PRN  IP okay to treat, , Continuous PRN  IP okay to treat, , Continuous PRN  LORazepam, 1 mg, BID PRN  melatonin, 3 mg, Nightly PRN  naloxegol, 12.5 mg, Daily PRN  oxyCODONE, 20 mg, Q4H PRN           Physical Exam     Temp:  [36.5 ??C (97.7 ??F)-36.8 ??C (98.2 ??F)] 36.5 ??C (97.7 ??F)  Heart Rate:  [85-118] 90  Resp:  [16-18] 18  BP: (101-126)/(66-74) 101/69  MAP (mmHg):  [78-87] 78  SpO2:  [97 %-100 %] 98 %    Actual body weight: 51.1 kg (112 lb 9.6 oz)  Ideal body weight: 57 kg (125 lb 10.6 oz)      Const [x]  vital signs above      []  WDWN, NAD, non-toxic appearance  []        Eyes   []  Lids normal bilaterally, conjunctiva anicteric and noninjected OU  []  PERRL   []        ENMT     []   Normal appearance of external nose and ears       []  OP clear    []  MMM, no lesions on lips or gums, dentition good        []  Hearing normal   []        Neck    []  Neck of normal appearance and trachea midline        []  No thyromegaly, nodules, or tenderness   []        Lymph    []  No LAD in neck       []  No LAD in supraclavicular area       []  No LAD in axillae   []  No LAD in epitrochlear chains       []  No LAD in inguinal areas  []        CV    [x]  RRR, no m/r/g, S1/S2       []  No peripheral edema, WWP       [x]  Pedal pulses intact   []        Resp   [x]  Normal WOB       [x]  CTAB   []        GI   []  Normal inspection, NTND, NABS       []  No umbilical hernia on exam       []  No hepatosplenomegaly       []  Inspection of perineal and perianal areas normal  []  Mild ttp diffusely with no rebound or guarding      GU   []  Normal external genitalia       []        MSK   []  No clubbing or cyanosis of hands       []  No focal tenderness or abnormalities on palpation of joints in RUE, LUE, RLE, or LLE  []        Skin   [x]  No rashes, lesions, or ulcers of visualized skin       [x]  Skin warm and dry to palpation   []        Neuro   [x]  CNs II-XII grossly intact       []  Sensation to light touch grossly intact throughout   []  DTRs normal and symmetric throughout   []  Unable to assess due to critical illness, sedation, or mental status  []        Psych   [x]  Appropriate affect      [x]  Oriented to person, place, time      []  Judgment and insight are appropriate   []  Unable to assess due to critical illness, sedation, or mental status  []           Patient Lines/Drains/Airways Status       Active Active Lines, Drains, & Airways Name Placement date Placement time Site Days    NG/OG Tube Feedings Left nostril 03/29/23  1200  Left nostril  less than 1    PICC Single Lumen 03/29/23 Left Brachial 03/29/23  1347  Brachial  less than 1    Peripheral IV 03/28/23 Right;Lower Forearm 03/28/23  2042  Forearm  1                      Data for ID Decision Making  ( IDGENCONMDM )       Micro & Serological Data   ( RSLTMICRO  /  [id number]  /  00CXSRC  /  00CXRES  /  00CXSUSC )    Microbiology Results (  last day)       Procedure Component Value Date/Time Date/Time    Blood Culture [[id number]]  (Normal) Collected: 03/25/23 2147    Lab Status: Preliminary result Specimen: Blood from 1 Peripheral Draw Updated: 03/29/23 2230     Blood Culture, Routine No Growth at 4 days    Blood Culture [[id number]]  (Abnormal) Collected: 03/25/23 1717    Lab Status: Preliminary result Specimen: Blood from 1 Peripheral Draw Updated: 03/29/23 1158     Blood Culture, Routine Streptococcus mitis group     Comment: Member of the Streptococcus viridans Group  Susceptibility Results to follow        Gram Stain Result Gram positive cocci    Blood Culture #1 [[id number]]  (Normal) Collected: 03/27/23 0945    Lab Status: Preliminary result Specimen: Blood from 1 Peripheral Draw Updated: 03/29/23 1145     Blood Culture, Routine No Growth at 48 hours    Blood Culture #2 [[id number]]  (Normal) Collected: 03/27/23 0945    Lab Status: Preliminary result Specimen: Blood from 1 Peripheral Draw Updated: 03/29/23 1145     Blood Culture, Routine No Growth at 48 hours                1/15 Bcx 1/2 strep mitis  1/17 Bcx NGTD    Recent Studies  ( RISRSLT )    XR Abdomen Portable  Result Date: 03/29/2023  EXAM: XR ABDOMEN PORTABLE ACCESSION: [id number] UN REPORT DATE: 03/29/2023 5:33 PM     CLINICAL INDICATION: 23 years old with NGT ([employer] ADJ)      COMPARISON: 03/14/2023     TECHNIQUE: Upright view of the abdomen, 1 image(s)     FINDINGS: Weighted enteric tube tip projects over the second portion of the duodenum. No pneumoperitoneum. Gaseous distention of multiple loops of bowel within the partially imaged upper abdomen in a nonspecific pattern. No acute osseous abnormality. Lung bases are clear.         Weighted enteric tube tip projects over the second portion of the duodenum.         ECG 12 Lead  Result Date: 03/29/2023  **POOR DATA QUALITY, INTERPRETATION MAY BE ADVERSELY AFFECTED NORMAL SINUS RHYTHM WITH SINUS ARRHYTHMIA NORMAL ECG WHEN COMPARED WITH ECG OF 15-Mar-2023 11:17, NO SIGNIFICANT CHANGE WAS FOUND Confirmed by Christella Noa ([id number]) on 03/29/2023 11:11:35 AM         Subjective:    Initial Consult Documentation from March 27, 2023     Sources of information include: chart review, patient, and family/friend(s).    HPI    23 y.o. 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    Patient underwent total abdominal colectomy with J pouch in 2022. Patient was previously admitted from 03/08/23-03/20/23 with refractory pain in setting of large tumor burden and diarrhea 2/2 pouchitis. She had increased bowel frequency up to 15-20 times daily with associated day time incontinence, urgency and hematochezia. She was started on cefdinir with improvement of symptoms. Pouchoscopy (03/13/23) revealed intact pouch with minimal inflammation and several superficial ulcerations at pouch anastamosis and pre-pouch inlet, suggestive of healing pouchitis. Biopsies with moderately active chronic enteritis, no CMV, granuloma, or dysplasia. She was then discharged on 1/10 on cefdinir 300mg  BID (1/1-1/28) and then transitioned 300mg  daily. She was scheduled for a GI follow-up on 1/22. Patient  then presented to [facility] ED on 1/15 due to patient's inability to tolerate PO and thinks may need TPN.     Upon arrival to [facility] ED, patient was afebrile with WBC to 11.4. She noted since discharge, she has experienced bowel movement frequency of 7-10 daily, the last noted watery without food particles with bright red blood along with severe pain. Blood cultures on 1/15 grew 1/2 strep mitis. She was started on ceftriaxone and vancomycin on 1/16. Patient developed vancomycin infusion syndrome, rashes and redness on her face, neck, and chest area.    Past Medical History   Patient  has a past medical history of Abdominal pain, Acid reflux, Anesthesia complication, Cancer (CMS-HCC), Cataract of right eye, COVID-19 virus infection (01/2019), Cyst of thyroid determined by ultrasound, [name], Difficult intravenous access, [name] (familial adenomatous polyposis), [name], Gastric polyps, History of chemotherapy, History of colon polyps, History of COVID-19 (01/2019), Ileus (CMS-HCC) (03/16/2022), Iron deficiency anemia due to chronic blood loss, PONV (postoperative nausea and vomiting), Rectal bleeding, and Syncopal episodes.      Meds and Allergies  Patient has a current medication list which includes the following prescription(s): acetaminophen, buprenorphine, cefdinir, couriered med or supply, escitalopram oxalate, famotidine, fluticasone propionate, hydrocortisone, lidocaine, lorazepam, naloxegol, naloxone, nirogacestat, ondansetron, pantoprazole, promethazine, scopolamine, sucralfate, and zinc oxide-cod liver oil, and the following Facility-Administered Medications: acetaminophen, buprenorphine, cetirizine, cholecalciferol (vitamin d3 25 mcg (1,000 units)), diphenhydramine, [DISCONTINUED] vancomycin in dextrose 5 % **AND** [CANCELED] Inpatient consult to Pharmacy RX to dose: vancomycin **AND** [DISCONTINUED] diphenhydramine **AND** diphenhydramine, escitalopram oxalate, famotidine, fluticasone propionate, hydrocortisone, hydromorphone (pf), IP OKAY TO TREAT, IP OKAY TO TREAT, IP OKAY TO TREAT, lidocaine, lorazepam, melatonin, mirtazapine, multivitamin with folic acid, naloxegol, nirogacestat, ondansetron, oxycodone, pantoprazole, promethazine (PHENERGAN) 12.5 mg in sodium chloride (NS) 0.9 % 50 mL IVPB, scopolamine, sodium chloride, thiamine mononitrate (vit b1), and vancomycin in dextrose 5 % **AND** Inpatient consult to Pharmacy RX to dose: vancomycin.    Allergies: Adhesive tape-silicones; Ferrlecit [sodium ferric gluconat-sucrose]; Levofloxacin; Methylnaltrexone; Neomycin; Papaya; Morphine; Zosyn [piperacillin-tazobactam]; Compazine [prochlorperazine]; Iron analogues; Reglan [metoclopramide hcl]; Iron dextran; and Latex, natural rubber    Social History  Patient  reports that she has never smoked. She has been exposed to tobacco smoke. She has never used smokeless tobacco. She reports that she does not drink alcohol and does not use drugs.    Scribe's Attest:  Francena Hanly, MD obtained and performed the history, physical exam and medical decision making elements that were entered into the chart. Documentation assistance was provided by me personally. Signed by Lanelle Bal, Scribe, on March 30, 2023 at 7:48 AM.     Provider???s Attestation:   Documentation assistance provided by the Scribe, Lanelle Bal. I was present during the time the encounter was Francena Hanly, MD. March 30, 2023 at 7:48 AM

## 2023-03-30 NOTE — Unmapped (Signed)
Problem: Adult Inpatient Plan of Care  Goal: Plan of Care Review  Outcome: Ongoing - Unchanged  Flowsheets (Taken 03/30/2023 1432)  Progress: no change  Outcome Evaluation: tolerate TF at goal, tolerate PO intake  Plan of Care Reviewed With: patient  Goal: Patient-Specific Goal (Individualized)  Outcome: Ongoing - Unchanged  Flowsheets (Taken 03/30/2023 1432)  Patient/Family-Specific Goals (Include Timeframe): pain management improves less than <7  Individualized Care Needs: falls, labs, monitor vitals, BG, blood glucose, tube feeding, flushes  Anxieties, Fears or Concerns: pain and nausea  Goal: Absence of Hospital-Acquired Illness or Injury  Outcome: Ongoing - Unchanged  Intervention: Identify and Manage Fall Risk  Recent Flowsheet Documentation  Taken 03/30/2023 1400 by [place] Thomas, Karen Kitchens, RN  Safety Interventions:   low bed   lighting adjusted for tasks/safety  Taken 03/30/2023 1200 by [place] Thomas, Karen Kitchens, RN  Safety Interventions:   lighting adjusted for tasks/safety   low bed  Taken 03/30/2023 0930 by [place] Thomas, Karen Kitchens, RN  Safety Interventions:   low bed   lighting adjusted for tasks/safety  Taken 03/30/2023 0800 by [place] Thomas, Karen Kitchens, RN  Safety Interventions:   lighting adjusted for tasks/safety   low bed  Intervention: Prevent Skin Injury  Recent Flowsheet Documentation  Taken 03/30/2023 1200 by [place] Thomas, Karen Kitchens, RN  Positioning for Skin: Bed in Chair  Taken 03/30/2023 0930 by [place] Thomas, Karen Kitchens, RN  Positioning for Skin: Bed in Chair  Taken 03/30/2023 0800 by [place] Thomas, Karen Kitchens, RN  Positioning for Skin: Bed in Chair  Skin Protection: adhesive use limited  Intervention: Prevent and Manage VTE (Venous Thromboembolism) Risk  Recent Flowsheet Documentation  Taken 03/30/2023 1400 by [place] Thomas, Karen Kitchens, RN  Anti-Embolism Device Status: Refused  Taken 03/30/2023 1200 by [place] Thomas, Karen Kitchens, RN  Anti-Embolism Device Status: Refused  Taken 03/30/2023 1100 by [place] Thomas, Karen Kitchens, RN  Anti-Embolism Device Status: Refused  Taken 03/30/2023 0930 by [place] Thomas, Karen Kitchens, RN  Anti-Embolism Device Status: Refused  Taken 03/30/2023 0800 by [place] Thomas, Karen Kitchens, RN  Anti-Embolism Device Status: Refused  Goal: Optimal Comfort and Wellbeing  Outcome: Ongoing - Unchanged  Goal: Readiness for Transition of Care  Outcome: Ongoing - Unchanged  Goal: Rounds/Family Conference  Outcome: Ongoing - Unchanged  Flowsheets (Taken 03/30/2023 1432)  Participants:   family   patient   pharmacy   physician   nursing     Problem: Infection  Goal: Absence of Infection Signs and Symptoms  Outcome: Ongoing - Unchanged  Intervention: Prevent or Manage Infection  Recent Flowsheet Documentation  Taken 03/30/2023 1200 by [place] Thomas, Karen Kitchens, RN  Infection Management: aseptic technique maintained  Isolation Precautions: protective precautions maintained  Taken 03/30/2023 0930 by [place] Thomas, Karen Kitchens, RN  Isolation Precautions: protective precautions maintained     Problem: Latex Allergy  Goal: Absence of Allergy Symptoms  Outcome: Ongoing - Unchanged     Problem: Oncology Care  Goal: Effective Coping  Outcome: Ongoing - Unchanged  Goal: Improved Activity Tolerance  Outcome: Ongoing - Unchanged  Intervention: Promote Improved Energy  Recent Flowsheet Documentation  Taken 03/30/2023 1400 by [place] Thomas, Karen Kitchens, RN  Activity Management: bedrest  Taken 03/30/2023 1200 by [place] Thomas, Karen Kitchens, RN  Activity Management: bedrest  Taken 03/30/2023 0930 by [place] Thomas, Karen Kitchens, RN  Activity Management: bedrest  Taken 03/30/2023 0800 by [place] Thomas, Karen Kitchens, RN  Activity Management: bedrest  Goal: Optimal Oral Intake  Outcome: Ongoing - Unchanged  Goal: Improved Oral Mucous Membrane Integrity  Outcome: Ongoing - Unchanged  Goal: Optimal Pain Control  and Function  Outcome: Ongoing - Unchanged     Problem: Self-Care Deficit  Goal: Improved Ability to Complete Activities of Daily Living  Outcome: Ongoing - Unchanged     Problem: Fall Injury Risk  Goal: Absence of Fall and Fall-Related Injury  Outcome: Ongoing - Unchanged  Intervention: Promote Injury-Free Environment  Recent Flowsheet Documentation  Taken 03/30/2023 1400 by [place] Thomas, Karen Kitchens, RN  Safety Interventions:   low bed   lighting adjusted for tasks/safety  Taken 03/30/2023 1200 by [place] Thomas, Karen Kitchens, RN  Safety Interventions:   lighting adjusted for tasks/safety   low bed  Taken 03/30/2023 0930 by [place] Thomas, Karen Kitchens, RN  Safety Interventions:   low bed   lighting adjusted for tasks/safety  Taken 03/30/2023 0800 by [place] Thomas, Karen Kitchens, RN  Safety Interventions:   lighting adjusted for tasks/safety   low bed     Problem: Skin Injury Risk Increased  Goal: Skin Health and Integrity  Outcome: Ongoing - Unchanged  Intervention: Optimize Skin Protection  Recent Flowsheet Documentation  Taken 03/30/2023 1400 by [place] Thomas, Karen Kitchens, RN  Activity Management: bedrest  Taken 03/30/2023 1200 by [place] Thomas, Karen Kitchens, RN  Activity Management: bedrest  Pressure Reduction Techniques: frequent weight shift encouraged  Pressure Reduction Devices: pressure-redistributing mattress utilized  Taken 03/30/2023 0930 by [place] Thomas, Karen Kitchens, RN  Activity Management: bedrest  Pressure Reduction Techniques: frequent weight shift encouraged  Pressure Reduction Devices: pressure-redistributing mattress utilized  Taken 03/30/2023 0800 by [place] Thomas, Karen Kitchens, RN  Activity Management: bedrest  Pressure Reduction Techniques: frequent weight shift encouraged  Pressure Reduction Devices: pressure-redistributing mattress utilized  Skin Protection: adhesive use limited     Problem: Pain Acute  Goal: Optimal Pain Control and Function  Outcome: Ongoing - Unchanged

## 2023-03-30 NOTE — Unmapped (Signed)
MD consult for nutrition enteral nutrition noted.  Please see consult note dated 01/17 for full assessment and recommendations for enteral feeds. Enteral nutrition was initiated on 01/19 with final goal of osmolite 1.5 @ 65 mL/hr per RD recs on 01/17.  Clinical nutrition will continue to follow patient for nutrition needs.      Prince Solian, MS, RD, LDN  [phone]

## 2023-03-30 NOTE — Unmapped (Signed)
=  Department of Anesthesiology  Pain Medicine Division    Chronic Pain inpatient Consult follow up Note      Requesting Attending Physician:  [name], MD  Service Requesting Consult:  Med Bernita Raisin [facility])    Assessment/Recommendations:  The patient was seen in consultation on request of [name], MD regarding assistance with pain management. The patient is not obtaining adequate pain relief on current medication regimen.     [name] [name] is a 23 year old female with a complex medical history, including DVT (RUE), [name] syndrome (post-colectomy), desmoid tumors, anemia, and severe protein-calorie malnutrition. She is followed at [facility] for chronic abdominal and right shoulder pain, consistent with cancer-associated pain. 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 Emer???s pain has worsened in recent months, likely due to intermittent intestinal obstructions related to her mesenteric desmoid tumors, which may cause intermittent bowel intussusception. Her pain is further complicated by narcotic bowel syndrome, visceral hyperalgesia, and a positive feedback loop where increased opioid use exacerbates the pain. Diona???s previous hospitalization was complicated by ileus, requiring NG tube, a prolonged inpatient rehab stay.  During her most recent hospitalization she was found to have some pouchitis and was continued on oral antibiotic.     Yesterday, she presented for an outpatient iron infusion but experienced a flare-up of her chronic abdominal pain, and significant nausea nausea. She was admitted to the [facility] emergency department for intractable acute on chronic pain, significant nausea, some hematochezia, dehydration.    Emergency department had contacted [name] on the evening of 03/25/2023 who had provided general recommendation for treatment plan.  Please review his note dated 03/25/2023.    I contacted [name] [name]'s primary pain provider, [name], who recommended the following treatment plan:  Continue Butrans patch at 20 mcg/h.  Increase oxycodone liquid to 15-20 mg every 4 hours as needed  Continue HM IV 1 mg q4 hrs.  Avoid ketamine infusion at this time.  Aggressively address nausea symptoms to be able to take PO meds    This plan was discussed with [name], who agreed to the recommendations.     Interval: NAEO. On 1/19 She recevied Corpak feeding tube. Not candidate for TPN/ G tube placement per GI. Has some bacteremia getting IV abx.   No vomiting. Still abdominal pain unchanged. Hgb stable. VS stable.   No other side effects concerns from opioids.   Didn't utilized Oxy liquid yesterday except 1 dose. Strongly advised to 1st utilize PO meds. And will have available IV HM for severe pain.   We will keep IV HM same today but once on regular oxy down the tube today, can discuss weaning from tomorrow.      Recommendations:  -The chronic pain service is a consult service and does not place orders, just makes recommendations (except ketamine and lidocaine infusions)  -Please evaluate all patients on opioids for appropriateness of prescribing narcan at discharge.  The chronic pain service can assist with this.  Nasal narcan covered by most insurance.  -Recommendations given apply to the current hospitalization and do not reflect long term recommendations.     Recommended to   -Continue Butrans patch 20 mcg/h q. Weekly  -Aggressively address nausea symptoms per primary team choice    -Continue oxycodone liquid 20 mg every 4 hours as needed (1st line therapy for severe pain), please encourage patient to utilize this modality, unless  she has intractable vomiting despite antiemetic    -Continue hydromorphone 1 mg IV every 4 hours as needed (2nd line therapy for severe pain). SECOND LINE, do NOT give unless patient has received Oxycodone liquid PRN within prior 2-3 hours, and pain is still severe intensity. With plan to discuss weaning again on Tuesday. Primary team can also initiate discussion for weaning with the patient. In the past patient had been given option for either weaning dose or frequency, per patient preference.      -Continue Tylenol 1 g 3 times daily    We will continue to see her.  Chronic pain service will be available to contact via pager.     Please prescribe naloxone at discharge.  Nasal narcan for most insured (Nasal narcan 4mg /actuation, prescribe 1 kit, instructions at [email or web address]).  For uninsured, chronic pain can work to assist in finding an option.  OTC nasal narcan now available at most pharmacies for around $45.    Allergies    Allergies   Allergen Reactions    Adhesive Tape-Silicones Hives and Rash     Paper tape and Tegederm ok. Biopatch causes blistering but has tolerated CHG Tegaderm    Ferrlecit [Sodium Ferric Gluconat-Sucrose] Swelling and Rash    Levofloxacin Swelling and Rash     Swelling in mouth, rash,     Methylnaltrexone      Per Patient: I lost bowel control, severe abdominal cramping, and elevated BP    Neomycin Swelling     Rxn after ear drops; ear swelling    Papaya Hives    Morphine Nausea And Vomiting    Zosyn [Piperacillin-Tazobactam] Itching and Rash     Red and itchy    Compazine [Prochlorperazine] Other (See Comments)     Extreme agitation    Iron Analogues     Reglan [Metoclopramide Hcl] Diarrhea    Iron Dextran Itching     Received iron dextran 06/08/22 over 12 hours, had itching and redness/flushing during the infusion and for a couple days after. Required IV benadryl w/flares between doses and ultimately treated w/IV methylpred for 2 days.     Latex, Natural Rubber Rash       Home Medications    Medications Prior to Admission   Medication Sig Dispense Refill Last Dose/Taking    acetaminophen (TYLENOL) 500 MG tablet Take 2 tablets (1,000 mg total) by mouth every eight (8) hours.       buprenorphine (BUTRANS) 20 mcg/hour PTWK transdermal patch Place 1 patch on the skin every seven (7) days. 4 patch 1     cefdinir (OMNICEF) 300 MG capsule Take 1 capsule (300 mg total) by mouth every twelve (12) hours for 19 days, THEN 1 capsule (300 mg total) daily for 28 days. 66 capsule 0     COURIERED MED OR SUPPLY OGSVEO 50 MG TABLET. 1 each 0     escitalopram oxalate (LEXAPRO) 5 MG tablet Take 1 tablet (5 mg total) by mouth daily. 30 tablet 0     famotidine (PEPCID) 20 MG tablet Take 1 tablet (20 mg total) by mouth nightly. 30 tablet 3     fluticasone propionate (FLONASE) 50 mcg/actuation nasal spray Use 1 spray under the butrans patch once every 7 days to prevent allergic reaction to the adhesive       hydrocortisone 2.5 % cream Apply topically two (2) times a day as needed (hemorrhoids). 30 g 0     lidocaine (ASPERCREME) 4 % patch Place 1 patch on the skin  daily as needed.       LORazepam (ATIVAN) 1 MG tablet Take 1 tablet (1 mg total) by mouth two (2) times a day as needed for anxiety. 60 tablet 0     naloxegol (MOVANTIK) 12.5 mg tablet Take 1 tablet (12.5 mg total) by mouth daily. (Patient taking differently: Take 1 tablet (12.5 mg total) by mouth daily as needed.) 30 tablet 0     naloxone (NARCAN) 4 mg nasal spray One spray in either nostril once for known/suspected opioid overdose. May repeat every 2-3 minutes in alternating nostril til EMS arrives 2 each 0     nirogacestat (OGSIVEO) 50 mg tablet Take 3 tablets (150 mg total) by mouth two (2) times a day. Swallow tablets whole; do not break, crush, or chew. 180 tablet 5     ondansetron (ZOFRAN-ODT) 8 MG disintegrating tablet Dissolve 1 tablet (8 mg total) in the mouth every eight (8) hours as needed for nausea. 90 tablet 0     [EXPIRED] oxyCODONE (ROXICODONE) 5 mg/5 mL solution Take 20 mL (20 mg total) by mouth every four (4) hours as needed for up to 7 days. 840 mL 0     pantoprazole (PROTONIX) 40 MG tablet Take 1 tablet (40 mg total) by mouth daily before breakfast. 30 tablet 0 promethazine (PHENERGAN) 12.5 MG tablet Take 1 tablet (12.5 mg total) by mouth every twelve (12) hours as needed for nausea. 14 tablet 0     scopolamine (TRANSDERM-SCOP) 1 mg over 3 days Place 1 patch (1 mg total) on the skin every third day. 10 patch 2     sucralfate (CARAFATE) 100 mg/mL suspension Take 10 mL (1 g total) by mouth four (4) times a day as needed.       zinc oxide-cod liver oil (DESITIN 40%) 40 % Pste Apply topically daily as needed. 57 g 0        Inpatient Medications  Current Facility-Administered Medications   Medication Dose Route Frequency Provider Last Rate Last Admin    acetaminophen (TYLENOL) oral liquid  1,000 mg Enteral tube: post-pyloric (duodenum, jejunum) Q8H SCH [name], MD        ammonium lactate (LAC-HYDRIN) 12 % lotion 1 Application  1 Application Topical BID [name], MD        buprenorphine 20 mcg/hour transdermal patch 1 patch  1 patch Transdermal Weekly Dholakia, Vibhaben, AGNP   1 patch at 03/26/23 0027    cetirizine (ZYRTEC) oral syrup  20 mg Enteral tube: post-pyloric (duodenum, jejunum) BID [name], MD        cholecalciferol (vitamin D3 25 mcg (1,000 units)) tablet 25 mcg  25 mcg Oral Daily Dholakia, Vibhaben, AGNP        diphenhydrAMINE (BENADRYL) injection  50 mg Intravenous Daily PRN Arman Filter, MD   50 mg at 03/30/23 0830    diphenhydrAMINE (BENADRYL) oral elixir  50 mg Enteral tube: post-pyloric (duodenum, jejunum) Q8H [name], MD        escitalopram Judye Bos) oral solution  5 mg Enteral tube: post-pyloric (duodenum, jejunum) Daily [name], MD        famotidine (PEPCID) oral suspension  20 mg Enteral tube: post-pyloric (duodenum, jejunum) Nightly [name], MD        fluticasone propionate (FLONASE) 50 mcg/actuation nasal spray 1 spray  1 spray Topical Weekly Dholakia, Vibhaben, AGNP   1 spray at 03/26/23 0027    hydrocortisone 2.5 % cream   Topical BID  PRN Dholakia, Vibhaben, AGNP HYDROmorphone (PF) (DILAUDID) injection 1 mg  1 mg Intravenous Q4H PRN [name], MD        IP OKAY TO TREAT   Other Continuous PRN Dholakia, Vibhaben, AGNP        IP OKAY TO TREAT   Other Continuous PRN Dholakia, Vibhaben, AGNP        IP OKAY TO TREAT   Other Continuous PRN Dholakia, Vibhaben, AGNP        lidocaine (ASPERCREME) 4 % 1 patch  1 patch Transdermal Daily Dholakia, Vibhaben, AGNP   1 patch at 03/30/23 0832    LORazepam (ATIVAN) tablet 1 mg  1 mg Oral BID PRN Dholakia, Vibhaben, AGNP   1 mg at 03/29/23 1243    melatonin tablet 3 mg  3 mg Oral Nightly PRN [name], MD        mirtazapine (REMERON) tablet 7.5 mg  7.5 mg Oral Nightly [name], MD        multivitamin with folic acid 400 mcg tablet 1 tablet  1 tablet Oral Daily [name], MD        naloxegol (MOVANTIK) 12.5 mg tablet 12.5 mg  12.5 mg Oral Daily PRN Dholakia, Vibhaben, AGNP        nirogacestat (OGSIVEO) tablet 150 mg  150 mg Oral BID Dholakia, Vibhaben, AGNP        ondansetron (ZOFRAN) injection 8 mg  8 mg Intravenous Q8H Dholakia, Vibhaben, AGNP   8 mg at 03/30/23 0981    oxyCODONE (ROXICODONE) 5 mg/5 mL solution 20 mg  20 mg Oral Q4H PRN [name], MD   20 mg at 03/30/23 1127    pantoprazole (Protonix) EC tablet 40 mg  40 mg Oral Daily before breakfast Dholakia, Vibhaben, AGNP        promethazine (PHENERGAN) 12.5 mg in sodium chloride (NS) 0.9 % 50 mL IVPB  12.5 mg Intravenous Q6H [name], MD   Stopped at 03/30/23 [phone]    scopolamine (TRANSDERM-SCOP) 1 mg over 3 days topical patch 1 mg  1 patch Topical Q72H Dholakia, Vibhaben, AGNP   1 mg at 03/29/23 0908    sodium chloride (NS) 0.9 % flush 10 mL  10 mL Intravenous Q8H [name], MD   10 mL at 03/30/23 0630    [START ON 03/31/2023] thiamine mononitrate (vit B1) tablet 200 mg  200 mg Oral Daily [name], MD        vancomycin (VANCOCIN) 750 mg in dextrose 5 % 150 mL IVPB (premix)  750 mg Intravenous Q8H [name], MD   Stopped at 03/30/23 0745         Past Medical History    Past Medical History:   Diagnosis Date    Abdominal pain     Acid reflux     occas    Anesthesia complication     itching, shaking, coldness; last few surgeries have gone much better    Cancer (CMS-HCC)     Cataract of right eye     COVID-19 virus infection 01/2019    Cyst of thyroid determined by ultrasound     monitoring    [name]     2 right forearm, 1 left thigh, 1 right scapula, 1 under left clavicle; multiple    Difficult intravenous access     [name] (familial adenomatous polyposis)     [name]     Gastric polyps  History of chemotherapy     last treatment approx 05/2019    History of colon polyps     History of COVID-19 01/2019    Ileus (CMS-HCC) 03/16/2022    Iron deficiency anemia due to chronic blood loss     received iron infusion 11-2019    PONV (postoperative nausea and vomiting)     Rectal bleeding     Syncopal episodes     especially if becoming dehydrated           Past Surgical History    Past Surgical History:   Procedure Laterality Date    COLON SURGERY      cyroablation      cystis removal      desmoid removal      PR CLOSE ENTEROSTOMY,RESEC+ANAST N/A 10/09/2020    Procedure: ILEOSTOMY TAKEDOWN;  Surgeon: Mickle Asper, MD;  Location: OR [place];  Service: General Surgery    PR COLONOSCOPY W/BIOPSY SINGLE/MULTIPLE N/A 10/27/2012    Procedure: COLONOSCOPY, FLEXIBLE, PROXIMAL TO SPLENIC FLEXURE; WITH BIOPSY, SINGLE OR MULTIPLE;  Surgeon: Shirlyn Goltz Mir, MD;  Location: PEDS PROCEDURE ROOM [facility];  Service: Gastroenterology    PR COLONOSCOPY W/BIOPSY SINGLE/MULTIPLE N/A 09/14/2013    Procedure: COLONOSCOPY, FLEXIBLE, PROXIMAL TO SPLENIC FLEXURE; WITH BIOPSY, SINGLE OR MULTIPLE;  Surgeon: Shirlyn Goltz Mir, MD;  Location: PEDS PROCEDURE ROOM [place];  Service: Gastroenterology    PR COLONOSCOPY W/BIOPSY SINGLE/MULTIPLE N/A 11/08/2014    Procedure: COLONOSCOPY, FLEXIBLE, PROXIMAL TO SPLENIC FLEXURE; WITH BIOPSY, SINGLE OR MULTIPLE;  Surgeon: Arnold Long Mir, MD;  Location: PEDS PROCEDURE ROOM [facility];  Service: Gastroenterology    PR COLONOSCOPY W/BIOPSY SINGLE/MULTIPLE N/A 12/26/2015    Procedure: COLONOSCOPY, FLEXIBLE, PROXIMAL TO SPLENIC FLEXURE; WITH BIOPSY, SINGLE OR MULTIPLE;  Surgeon: Arnold Long Mir, MD;  Location: PEDS PROCEDURE ROOM [facility];  Service: Gastroenterology    PR COLONOSCOPY W/BIOPSY SINGLE/MULTIPLE N/A 09/02/2017    Procedure: COLONOSCOPY, FLEXIBLE, PROXIMAL TO SPLENIC FLEXURE; WITH BIOPSY, SINGLE OR MULTIPLE;  Surgeon: Arnold Long Mir, MD;  Location: PEDS PROCEDURE ROOM [place];  Service: Gastroenterology    PR COLSC FLX W/REMOVAL LESION BY HOT BX FORCEPS N/A 08/27/2016    Procedure: COLONOSCOPY, FLEXIBLE, PROXIMAL TO SPLENIC FLEXURE; W/REMOVAL TUMOR/POLYP/OTHER LESION, HOT BX FORCEP/CAUTE;  Surgeon: Arnold Long Mir, MD;  Location: PEDS PROCEDURE ROOM [facility];  Service: Gastroenterology    PR COLSC FLX W/RMVL OF TUMOR POLYP LESION SNARE TQ N/A 02/25/2019    Procedure: COLONOSCOPY FLEX; W/REMOV TUMOR/LES BY SNARE;  Surgeon: Helyn Numbers, MD;  Location: GI PROCEDURES [place] [facility];  Service: Gastroenterology    PR COLSC FLX W/RMVL OF TUMOR POLYP LESION SNARE TQ N/A 03/13/2020    Procedure: COLONOSCOPY FLEX; W/REMOV TUMOR/LES BY SNARE;  Surgeon: Helyn Numbers, MD;  Location: [facility] PROCEDURES [place] [facility];  Service: Gastroenterology    PR EXC SKIN BENIG 2.1-3 CM TRUNK,ARM,LEG Right 02/25/2017    Procedure: EXCISION, BENIGN LESION INCLUDE MARGINS, EXCEPT SKIN TAG, LEGS; EXCISED DIAMETER 2.1 TO 3.0 CM;  Surgeon: Clarene Duke, MD;  Location: CHILDRENS OR [facility];  Service: Plastics    PR EXC SKIN BENIG 3.1-4 CM TRUNK,ARM,LEG Right 02/25/2017    Procedure: EXCISION, BENIGN LESION INCLUDE MARGINS, EXCEPT SKIN TAG, ARMS; EXCISED DIAMETER 3.1 TO 4.0 CM;  Surgeon: Clarene Duke, MD;  Location: CHILDRENS OR [facility];  Service: Plastics    PR EXC SKIN BENIG >4 CM FACE,FACIAL Right 02/25/2017 Procedure: EXCISION, OTHER BENIGN LES INCLUD MARGIN, FACE/EARS/EYELIDS/NOSE/LIPS/MUCOUS MEMBRANE; EXCISED DIAM >4.0 CM;  Surgeon: Clarene Duke, MD;  Location: CHILDRENS OR [place];  Service: Plastics    PR EXC TUMOR SOFT TISSUE LEG/ANKLE SUBQ 3+CM Right 08/05/2019    Procedure: EXCISION, TUMOR, SOFT TISSUE OF LEG OR ANKLE AREA, SUBCUTANEOUS; 3 CM OR GREATER;  Surgeon: Arsenio Katz, MD;  Location: MAIN OR [place];  Service: Plastics    PR EXC TUMOR SOFT TISSUE LEG/ANKLE SUBQ <3CM Right 08/05/2019    Procedure: EXCISION, TUMOR, SOFT TISSUE OF LEG OR ANKLE AREA, SUBCUTANEOUS; LESS THAN 3 CM;  Surgeon: Arsenio Katz, MD;  Location: MAIN OR [facility];  Service: Plastics    PR LAP, SURG PROCTECTOMY W J-POUCH N/A 08/10/2020    Procedure: ROBOTIC ASSISTED LAPAROSCOPIC PROCTOCOLECTOMY, ILEAL J POUCH, WITH OSTOMY;  Surgeon: Mickle Asper, MD;  Location: OR [place];  Service: General Surgery    PR NDSC EVAL INTSTINAL POUCH DX W/COLLJ SPEC SPX N/A 01/23/2021    Procedure: ENDO EVAL SM INTEST POUCH; DX;  Surgeon: Modena Nunnery, MD;  Location: [facility] PROCEDURES [place] [facility];  Service: Gastroenterology    PR NDSC EVAL INTSTINAL POUCH DX W/COLLJ SPEC SPX N/A 08/27/2021    Procedure: ENDO EVAL SM INTEST POUCH; DX;  Surgeon: Hunt Oris, MD;  Location: GI PROCEDURES [facility];  Service: Gastroenterology    PR NDSC EVAL INTSTINAL POUCH DX W/COLLJ SPEC SPX N/A 12/09/2021    Procedure: ENDO EVAL SM INTEST POUCH; DX;  Surgeon: Vidal Schwalbe, MD;  Location: GI PROCEDURES [facility];  Service: Gastroenterology    PR NDSC EVAL INTSTINAL POUCH DX W/COLLJ [facility] SPX Left 04/09/2022    Procedure: ENDO EVAL SM INTEST POUCH; DX;  Surgeon: Modena Nunnery, MD;  Location: [place] [facility]ED[facility] [place] [facility];  Service: Gastroenterology    PR NDSC EVAL INTSTINAL POUCH DX W/COLLJ SPEC SPX N/A 08/05/2022    Procedure: ENDO EVAL SM INTEST POUCH; DX;  Surgeon: Modena Nunnery, MD;  Location: GI PROCEDURES [facility];  Service: Gastroenterology    PR NDSC EVAL INTSTINAL POUCH DX W/COLLJ SPEC SPX N/A 03/13/2023    Procedure: ENDO EVAL SM INTEST POUCH; DX;  Surgeon: Carmon Ginsberg, MD;  Location: GI PROCEDURES [facility];  Service: Gastroenterology    PR NDSC EVAL INTSTINAL POUCH W/BX SINGLE/MULTIPLE N/A 01/20/2022    Procedure: ENDOSCOPIC EVAL OF SMALL INTESTINAL POUCH; DIAGNOSTIC, No biopsies;  Surgeon: Andrey Farmer, MD;  Location: GI PROCEDURES [facility];  Service: Gastroenterology    PR NDSC EVAL INTSTINAL POUCH W/BX SINGLE/MULTIPLE N/A 02/13/2022    Procedure: ENDOSCOPIC EVAL OF SMALL INTESTINAL POUCH; DIAGNOSTIC, WITH BIOPSY;  Surgeon: Bronson Curb, MD;  Location: GI PROCEDURES [facility];  Service: Gastroenterology    PR NDSC EVAL INTSTINAL POUCH W/BX SINGLE/MULTIPLE N/A 03/13/2023    Procedure: ENDOSCOPIC EVAL OF SMALL INTESTINAL POUCH; DIAGNOSTIC, WITH BIOPSY;  Surgeon: Carm[place] Ginsberg, MD;  Location: GI PROCEDURES [facility];  Service: Gastroenterology    PR UNLISTED PROCEDURE SMALL INTESTINE  01/23/2021    Procedure: UNLISTED PROCEDURE, SMALL INTESTINE;  Surgeon: Modena Nunnery, MD;  Location: GI PROCEDURES [place] [facility];  Service: Gastroenterology    PR UNLISTED PROCEDURE SMALL INTESTINE  02/13/2022    Procedure: UNLISTED PROCEDURE, SMALL INTESTINE;  Surgeon: Bronson Curb, MD;  Location: GI PROCEDURES [facility]  Service: Gastroenterology    PR UPPER GI ENDOSCOPY,BIOPSY N/A 10/27/2012    Procedure: UGI ENDOSCOPY; WITH BIOPSY, SINGLE OR MULTIPLE;  Surgeon: Shirlyn Goltz Mir, MD;  Location: PEDS PROCEDURE ROOM [facility];  Service: Gastroenterology    PR UPPER GI ENDOSCOPY,BIOPSY N/A 09/14/2013  Procedure: UGI ENDOSCOPY; WITH BIOPSY, SINGLE OR MULTIPLE;  Surgeon: Shirlyn Goltz Mir, MD;  Location: PEDS PROCEDURE ROOM [facility];  Service: Gastroenterology    PR UPPER GI ENDOSCOPY,BIOPSY N/A 11/08/2014    Procedure: UGI ENDOSCOPY; WITH BIOPSY, SINGLE OR MULTIPLE;  Surgeon: Arnold Long Mir, MD;  Location: PEDS PROCEDURE ROOM [facility];  Service: Gastroenterology    PR UPPER GI ENDOSCOPY,BIOPSY N/A 12/26/2015    Procedure: UGI ENDOSCOPY; WITH BIOPSY, SINGLE OR MULTIPLE;  Surgeon: Arnold Long Mir, MD;  Location: PEDS PROCEDURE ROOM [facility];  Service: Gastroenterology    PR UPPER GI ENDOSCOPY,BIOPSY N/A 08/27/2016    Procedure: UGI ENDOSCOPY; WITH BIOPSY, SINGLE OR MULTIPLE;  Surgeon: Arnold Long Mir, MD;  Location: PEDS PROCEDURE ROOM [facility];  Service: Gastroenterology    PR UPPER GI ENDOSCOPY,BIOPSY N/A 09/02/2017    Procedure: UGI ENDOSCOPY; WITH BIOPSY, SINGLE OR MULTIPLE;  Surgeon: Arnold Long Mir, MD;  Location: PEDS PROCEDURE ROOM [facility];  Service: Gastroenterology    PR UPPER GI ENDOSCOPY,BIOPSY N/A 03/13/2020    Procedure: UGI ENDOSCOPY; WITH BIOPSY, SINGLE OR MULTIPLE;  Surgeon: Helyn Numbers, MD;  Location: GI PROCEDURES [place] [facility];  Service: Gastroenterology    PR UPPER GI ENDOSCOPY,BIOPSY N/A 09/05/2021    Procedure: UGI ENDOSCOPY; WITH BIOPSY, SINGLE OR MULTIPLE;  Surgeon: Wendall Papa, MD;  Location: GI PROCEDURES [facility];  Service: Gastroenterology    PR UPPER GI ENDOSCOPY,DIAGNOSIS N/A 01/20/2022    Procedure: UGI ENDO, INCLUDE ESOPHAGUS, STOMACH, & DUODENUM &/OR JEJUNUM; DX W/WO COLLECTION SPECIMN, BY BRUSH OR WASH;  Surgeon: Andrey Farmer, MD;  Location: GI PROCEDURES [facility];  Service: Gastroenterology    TUMOR REMOVAL      multiple-head, neck, back, hand, right flank, multiple           Family History    Family History   Problem Relation Age of Onset    No Known Problems Mother     No Known Problems Father     No Known Problems Sister     No Known Problems Brother     Stroke Maternal Grandmother     Other Maternal Grandmother         benign lesions of liver and pancreas, further details unknown    Cancer Maternal Grandmother     Diabetes Maternal Grandmother     Hypertension Maternal Grandmother     Thyroid disease Maternal Grandmother Arthritis Maternal Grandfather     Asthma Maternal Grandfather     COPD Paternal Grandmother         Deceased    Miscarriages / Stillbirths Paternal Grandmother     Alcohol abuse Paternal Grandfather         Deceased    No Known Problems Maternal Aunt     No Known Problems Maternal Uncle     No Known Problems Paternal Aunt     No Known Problems Paternal Uncle     Anesthesia problems Neg Hx     Broken bones Neg Hx     Cancer Neg Hx     Clotting disorder Neg Hx     Collagen disease Neg Hx     Diabetes Neg Hx     Dislocations Neg Hx     Fibromyalgia Neg Hx     Gout Neg Hx     Hemophilia Neg Hx     Osteoporosis Neg Hx     Rheumatologic disease Neg Hx     Scoliosis Neg Hx     Severe sprains Neg Hx  Sickle cell anemia Neg Hx     Spinal Compression Fracture Neg Hx     Melanoma Neg Hx     Basal cell carcinoma Neg Hx     Squamous cell carcinoma Neg Hx          Social History:  Social History     Tobacco Use   Smoking Status Never    Passive exposure: Past   Smokeless Tobacco Never     Social History     Substance and Sexual Activity   Alcohol Use Never     Social History     Substance and Sexual Activity   Drug Use Never         Lab Results   Component Value Date    CREATININE 0.54 (L) 03/30/2023       Lab Results   Component Value Date    ALKPHOS 106 03/30/2023    BILITOT 0.5 03/30/2023    BILIDIR <0.10 03/17/2023    PROT 6.9 03/30/2023    ALBUMIN 3.7 03/30/2023    ALT 59 (H) 03/30/2023    AST 48 (H) 03/30/2023       Encounter Date: 03/25/23   ECG 12 Lead   Result Value    EKG Systolic BP     EKG Diastolic BP     EKG Ventricular Rate 84    EKG Atrial Rate 84    EKG P-R Interval 126    EKG QRS Duration 86    EKG Q-T Interval 368    EKG QTC Calculation 434    EKG Calculated P Axis 51    EKG Calculated R Axis 32    EKG Calculated T Axis 20    QTC Fredericia 411    Narrative    **POOR DATA QUALITY, INTERPRETATION MAY BE ADVERSELY AFFECTED  NORMAL SINUS RHYTHM WITH SINUS ARRHYTHMIA  NORMAL ECG  WHEN COMPARED WITH ECG OF 15-Mar-2023 11:17,  NO SIGNIFICANT CHANGE WAS FOUND  Confirmed by Christella Noa ([id number]) on 03/29/2023 11:11:35 AM       No results found for requested labs within last 30 days.       Objective:     Vital Signs  Temp:  [36.5 ??C (97.7 ??F)-36.8 ??C (98.2 ??F)] 36.6 ??C (97.9 ??F)  Heart Rate:  [90-118] 90  Resp:  [16-18] 18  BP: (101-126)/(66-74) 116/73  MAP (mmHg):  [78-87] 86  SpO2:  [97 %-100 %] 99 %    Physical Exam    GENERAL:  Well developed, well-nourished female and is in no apparent distress.  HEAD/NECK:    Reveals normocephalic/atraumatic. Corpak tube in place  CARDIOVASCULAR:   Regular rate  LUNGS:   Normal work of breathing, no supplemental 02  EXTREMITIES:  Warm, no clubbing, cyanosis, or edema was noted.  NEUROLOGIC:    The patient was alert and oriented times four with normal language, attention, cognition and memory. Cranial nerve exam was grossly normal.  MUSCULOSKELETAL:    Moving all 4 extremities  SKIN:  No obvious rashes lesions or erythema  PSY:  Appropriate affect and mood.      Problem List    Principal Problem:    Cancer associated pain  Active Problems:    [name]    Pain medication agreement signed    Pouchitis (CMS-HCC)

## 2023-03-30 NOTE — Unmapped (Signed)
Hospital Medicine Daily Progress Note    Assessment/Plan:    Principal Problem:    Cancer associated pain  Active Problems:    [name]    Pain medication agreement signed    Pouchitis (CMS-HCC)   Malnutrition Evaluation as performed by RD, LDN: Severe Protein-Calorie Malnutrition in the context of chronic illness (03/27/23 1311)             [name] [name] is a 23 y.o. 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     Positive blood culture for Strep mitis  Blood cultures 03/25/23 (ordered by ED on presentation for workup of poor PO intake) growing Strep mitis in 1/2 sets. Unclear if this is true bacteremia or contaminant as she was afebrile, without leukocytosis or sepsis on admission. If true infection, low suspicion for odontogenic source given good dentition and regular dental care; GI source is consideration, but GI luminal team has low suspicion for recent pouchitis as source as inflammation was minimal on recent pouchoscopy, and she has been taking cefdinir since then. She was started on empiric vancomycin c/b Redman syndrome and itching, but no evidence of airway compromise.  Case discussed with ID; there is significant ceftriaxone-resistance in Strep mitis and patient with prior polymicrobial bacteremia in setting of TPN, so recommend treating with vanc and ertapenem for broad coverage initially. Stopped ertapenem when blood cultures showed no other bacteria at 72 hrs. Patient was off vancomycin on 1/18-1/19 due to no IV access. Vancomycin was resumed 1/19.  - Follow up final blood cultures and Strep mitis sensitivities  - Repeat blood cultures for clearance thus far NGTD  - Resumed Vancomycin on 1/19 per ID recs pending final cxs  - PICC line placed in left arm on 1/19  - S/p ertapenem 1/17-1/18, discontinued 1/18 given no other bacteria growing on blood cultures    Hypoglycemia (resolved)  BG 61-69 on 1/18. Hypoglycemia resolved after starting D10W, now also resolved on enteral nutrition.  - Discontinued glucose checks    Poor PO intake / Nausea  Presyncopal episodes at home  Severe Protein-Calorie Malnutrition in the context of chronic illness (03/27/23 1311)  Energy Intake: < or equal to 75% of estimated energy requirement for > or equal to 1 month  Interpretation of Wt. Loss: > 7.5% x 3 month  Malnutrition Score: 2  Patient presented as she felt she needed to be back on TPN. This has been discussed with her primary outpatient providers including Dr. [name], Dr. Carmon Sails, Dr. [name], and [name] her NP who do not see a current role for TPN at this time, especially in light of her new positive blood cultures that would preclude line placement. She notes poor PO intake but has been able to keep down small amounts of crackers and liquid. She does not usually vomit but has felt very nauseated and feels that her chronic abdominal pain is worsened by eating. She currently does not feel able to take PO medications and requests IV alternatives where possible. She is hemodynamically stable, moist mucous membranes on exam. Labs extremely reassuring with normal electrolytes, albumin 3.7. Case discussed with GI today 1/18 who recommend Corpak placement for enteral nutrition.  - Corpak placed 1/19, confirmed post-pyloric on XR  - Enteral nutrition Osmolite 1.5 per nutrition recs, slowly up-titrating to goal  - Appreciate GI  recommendations  - Appreciate dietician recommendations  - Nausea mgmt: Zofran 8 mg IV q8h scheduled & Phenergan 12.5 mg IV q6h PRN --> Will transition to oral as able  - Continue home scopolamine patch  - Added Remeron 7.5 mg nightly on 1/18 for sleep and nausea, ok to hold if unable to tolerate po    Acute on chronic pain  In addition to nausea as above, patient presents due to uncontrolled abdominal pain, worse in the lower quadrants. No peritonitic signs on exam. Follows with chronic pain, see treatment plan from 1/15.  - Appreciate chronic pain recommendations  - Continue home butrans patch, scheduled tylenol  - Oxycodone liquid 20 mg q4h prn   - Continue Dilaudid 1 mg IV q4h prn severe pain only to be used as 2nd line after trying oxycodone if pain does not improve 1 hour later -- This plan was discussed with the patient as well as with Sibyl Parr MD, the attending on the chronic pain service.  - Do not escalate frequency of IV dilaudid without contacting chronic pain first    Recent mild pouchitis  Ongoing bright red blood per rectum  Patient with total abdominal colectomy with J pouch in 2022. Recent admission (12/29-1/10) for pouchitis, with pouchoscopy 03/13/23 showing minimal inflammation consistent with healing pouchitis. She has been taking cefdinir for planned 8 week course (300 mg BID 1/1-1/28, then 300 mg daily 1/29-2/25). She continues to have some BRBPR with each bowel movement and reports frequent watery stools. Her H/H stable on admission (Hgb 12.8) and VSS.   - Appreciate GI recommendations  - No indication for repeat pouchoscopy  - No need for GIPP or C diff testing per GI team given chronic loose stools  - Holding cefdinir given patient is on vancomycin and feels she cannot tolerate oral antibiotics at this time. GI team aware. Will discuss the risk/benefit of add IV ceftriaxone.    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 [name]. Patient has numerous sites of disease but most bothersome has been mesenteric sites which have caused chronic abdominal pain, nausea/vomiting. She presents at this time due to inadequately controlled abdominal pain and nausea, which have made it difficult for her to take in food or water. Please see Dr. Brynda Rim note from 1/14 addended 1/15 with his very thoughtful assessment of this situation and current hospitalization.  - Holding home nirogacestat per Dr. [name] while feeling so poorly    Chronic Problems  PTSD  GAD  MDD  - Continue home lexapro  - Continue home ativan PRN     GERD  - Continue protonix, famotidine     Allergies  - Continue cetirizine     Checklist:  Diet: Regular Diet  DVT PPx: Ambulation  Code Status: Full Code  Dispo: anticipate discharge home vs AIR (based on PT/OT recs)    I personally spent 55 minutes face-to-face and non-face-to-face in the care of this patient, which includes all pre, intra, and post visit time on the date of service.  All documented time was specific to the E/M visit and does not include any procedures that may have been performed.  ___________________________________________________________________    Subjective:  No acute events overnight. This morning the patient states that she is having a very hard time due to pain and nausea. She says the tube feeds are making her feel much worse. When I told her that she has to try the oral oxycodone before taking the IV  dilaudid, she expressed feeling a loss of control over her care. I explained that we have accommodated all of her requests up to this point, there is no physiologic reason why her GI tract is not functional, and I feel it is a reasonable request to ask her to try the oral oxycodone via corpak before using the IV dilaudid.    Labs/Studies:  Labs and Studies from the last 24hrs per EMR and Reviewed    Objective:  Temp:  [36.5 ??C (97.7 ??F)-36.8 ??C (98.2 ??F)] 36.5 ??C (97.7 ??F)  Heart Rate:  [85-118] 90  Resp:  [16-18] 18  BP: (101-126)/(66-74) 101/69  SpO2:  [97 %-100 %] 98 %    Gen: Sitting up in bed, non-toxic-appearing, mom at bedside  Eyes: appropriate eye contact, conjunctivae clear  HENT: MMM, normal appearing nose, corpak in left nare  Pulm: Speaking in full sentences, nl WOB on RA, clear to auscultation bilaterally  Abd: Soft, nondistended, diffusely tender  Ext: WWP, no edema or cyanosis  Neuro: A&O x 3, no focal deficits, conversant  Psych: Anxious, intermittently tearful, exhibits logical thinking and appropriate communication

## 2023-03-30 NOTE — Unmapped (Signed)
Luminal Gastroenterology Consult Service   Progress Note         Assessment and Recommendations:   [name] [name] is a 23 y.o. female with a PMHx of [name] ([name]) s/p proctocolectomy w/ileoanal anastomosis in 2022, desmoid tumors (currently on nirogacestat, previously on sorafenib), anemia, previously on TPN and prolonged tube feeds, chronic pain, PTSD  who presented to [facility] with recurrent abdominal pain, pre-syncope, hematochezia, weight loss. The patient is seen in consultation at the request of [name], MD ([facility])) for  multiple GI complaints .    Acute on chronic abdominal pain - Poor oral intake  Patient has had longstanding concerns over nutritional status, has previously required TPN and enteral feeding, without clear evidence of weight loss despite compliance with prescribed regimens. Patient reports intolerance to all oral intake, including oral medications because of severity of pain and nausea. Patient has previously had abdominal imaging without acute obstruction, no history of short bowel syndrome and no other prior structural abnormalities on EGD that would physiologically prevent patient from eating by mouth. Although patient has objectively lost weight (30lbs since ~summer, unclear if related to fluid overload), no evidence of low albumin, electrolyte derangements/AKI. Priority goal during early portion of hospitalization would be to optimize IV formulations of anti-emetics and analgesics to hopefully break the cycle of acuity of symptoms, followed by more carefully addressing nutritional +psychosocial distress surrounding chronic illness needs once enteral feeding is initiated.    - No acute indication for initiation of TPN, GI has discussed with patient that this request has been declined for multiple reasons not limited to potential acute blood stream infection.  - Recommend placement of a Corpak. Patient reports previous intolerance to NG tube and requesting post-pyloric feeding which we will try to accommodate. Not a candidate for PEG tube placement due to desmoid tumors  - Pending symptom trajectory, may consider inpatient EGD  - Agree with Nutrition and Pain Management consults  - Continue scheduled IV Zofran and IV promethazine for breakthrough nausea.  - Pain management per primary team    [name] s/p total colectomy + ileal pouch   She was recently hospitalized on the inpatient solid oncology service with hematochezia and c/f for recurrent pouchitis from 03/08/23 to 03/20/23. Pouchoscopy on 1/3 showed intact pouch with minimal inflammation. Low suspicion for active pouchitis or antimicrobial therapy failure contributing to current symptomatology. In addition, do not think that there is an ongoing GI source of bacteremia contributing. Will need to monitor clinical trajectory inpatient, low suspicion for clinically significant GIB given hemoglobin is within normal limits. Patient may have some hemorrhoidal bleeding contributing but low suspicion for inflammatory disorder of the pouch.     - Continue GIB precautions while inpatient with appropriate access, at least daily CBC, active T&S  - Asked patient to maintain stool diary with photodocumentation of hematochezia  - No acute indication for repeat pouchoscopy, consider EGD this admission for follow up of [name]  - Appreciate ID recommendations for risk/benefit of antimicrobial therapy and patient reported side effects towards IV antibiotics      Issues Impacting Complexity of Management:  -Discussed this patient's care with Dr. Britt Bolognese from the Gastroenterology service as summarized in this note    Recommendations discussed with the patient's primary team. We will continue to follow along with you.    History of Present Illness:     Interval events: Patient underwent PICC placement followed by significant pain at site of PICC, requesting IV  pain medication. Ongoing symptoms of itching with vancomycin infusions requiring IV Benadryl. Had Corpak placement this afternoon. Reporting a lot of concerns about inability to tolerate certain tube feed formulations, food allergies/sensitivities, refeeding syndrome, prior GI bleeding with tubefeeds (none seen currently), and concern about ongoing ulcers. Very fixated on potential future problems that she anticipates with treatment and expected need for future hospitalizations.     -I have reviewed the patient's prior records from [facility], [facility], [facility] Gastroenterology as summarized in the HPI    Objective:   Temp:  [36.6 ??C (97.9 ??F)-36.8 ??C (98.2 ??F)] 36.7 ??C (98.1 ??F)  Heart Rate:  [71-118] 111  Resp:  [16-18] 18  BP: (101-126)/(64-74) 126/74  SpO2:  [94 %-100 %] 97 %    Gen: WDWN female, anxious appearing, answers questions appropriately  Abdomen: Soft, diffuse tenderness to palpation, non-distended, no rebound/guarding, no hepatosplenomegaly. Surgical scars present and intact.   Extremities: No edema in the BLEs    Wt Readings from Last 12 Encounters:   03/27/23 51.1 kg (112 lb 9.6 oz)   03/24/23 52.8 kg (116 lb 6.4 oz)   03/17/23 53.5 kg (118 lb)   02/24/23 54.1 kg (119 lb 4.8 oz)   01/23/23 57.4 kg (126 lb 9.6 oz)   01/21/23 58 kg (127 lb 12.8 oz)   01/14/23 58 kg (127 lb 12.8 oz)   12/16/22 58.5 kg (129 lb)   12/16/22 58.5 kg (129 lb)   12/08/22 61.3 kg (135 lb 3.2 oz)   11/14/22 61 kg (134 lb 8 oz)   09/24/22 62.4 kg (137 lb 9.6 oz)        Pertinent Labs/Studies:  -I have reviewed the patient's labs from 03/29/23 which show stable Hgb and stable renal function (SCr, electrolytes) and 1/2 blood culture bottles positive for strep mitis.      Pouchoscopy biopsy:  A: Small bowel, pouch, biopsy  - Erosive moderately active chronic enteritis  - No CMV viral cytopathic effect, granuloma, or dysplasia identified     Pouchoscopy 1/3:   Intact pouch with minimal inflammation and several superficial ulcerations at the pouch anastamosis and pre-pouch inlet, suggestive of healing pouchitis.     Other scopes:  Pouch exam 07/2022 - normal ileum, rectal cuff appeared normal with the exception of small rectal polpys, which were treated with APC   Pouch exam 03/2022 - friable mucosa a ileoanal pouch suture line, treated with APC   Pouch exam 02/2022 - anal fissure, 10mm circumferential ulcer oozing at the ileocolonic anastomosis, treated with APC   Pouch exam 01/2022 - anal fissure, ulcer at ileocolonic anastomosis   EGD 01/2022 - multple polyps in the stomach, normal esophagus, duodenum and jejunum   EGD 08/2021- fundic gland polyps in the stomach, otherwise normal  EGD 2022- fundic gland polyps in the stomach, adenomatous tissue removed from small bowel on random biopsy to rule out celiac disease (none seen endoscopically)      01/23/2023 MRI A/P:  - Overall slightly increased size of the ill-defined enhancing soft tissue in the central lower mesentery extending into the pelvis/presacral region with associated small bowel tethering corresponding to desmoid tumor.   - Sequela of proctocolectomy and ileoanal anastomosis with grossly similar dilation of bowel proximal to the anastomosis.   -Three enhancing soft tissue lesions in the right rectus musculature, the largest of which is smaller in size compared to 11/11/2022.  - Slightly increased size of multiple prominent central mesenteric lymph nodes measuring up to 1.0 cm  which may be reactive.  - Increased size of a nonenhancing 1.9 cm left interpolar parapelvic cyst with layering internal hemorrhagic debris.  - Marked narrowing of the left renal vein as it crosses between the SMA and aorta, correlate clinically for signs of nutcracker syndrome.  - Findings suggestive of iron deposition within the liver.  - Numerous cystic foci along the pelvic wall without definite enhancement as described in the body of the report, correlate with physical examination.

## 2023-03-30 NOTE — Unmapped (Signed)
Pt is alert and oriented and reports abd pain d/t cancer associated pain. Pt reports loss of appetite d/t nausea. Pt has corpak placed to left nose and will receive tube feeding once verified. Pt receiving IV dilaudid, PO oxycodone and IV Phenergan for pain and nausea. Family at bedside. Np vomting noted on  my shift.   Problem: Adult Inpatient Plan of Care  Goal: Plan of Care Review  Outcome: Ongoing - Unchanged  Flowsheets (Taken 03/29/2023 1646)  Progress: no change  Plan of Care Reviewed With: patient  Goal: Patient-Specific Goal (Individualized)  Outcome: Ongoing - Unchanged  Goal: Absence of Hospital-Acquired Illness or Injury  Outcome: Ongoing - Unchanged  Intervention: Identify and Manage Fall Risk  Recent Flowsheet Documentation  Taken 03/29/2023 1600 by Ardelle Lesches, RN  Safety Interventions:   fall reduction program maintained   family at bedside   low bed   lighting adjusted for tasks/safety  Taken 03/29/2023 1400 by Merlene Pulling, Alphonse Guild, RN  Safety Interventions:   fall reduction program maintained   family at bedside   lighting adjusted for tasks/safety   low bed   nonskid shoes/slippers when out of bed  Taken 03/29/2023 1200 by Merlene Pulling, Alphonse Guild, RN  Safety Interventions:   fall reduction program maintained   low bed   lighting adjusted for tasks/safety   nonskid shoes/slippers when out of bed  Taken 03/29/2023 1000 by Merlene Pulling, Alphonse Guild, RN  Safety Interventions:   fall reduction program maintained   family at bedside   isolation precautions   lighting adjusted for tasks/safety   low bed   nonskid shoes/slippers when out of bed  Taken 03/29/2023 0800 by Ardelle Lesches, RN  Safety Interventions:   fall reduction program maintained   family at bedside   isolation precautions   lighting adjusted for tasks/safety   low bed   nonskid shoes/slippers when out of bed  Intervention: Prevent Skin Injury  Recent Flowsheet Documentation  Taken 03/29/2023 1600 by Ardelle Lesches, RN  Positioning for Skin: Supine/Back  Taken 03/29/2023 1400 by Ardelle Lesches, RN  Positioning for Skin: Supine/Back  Taken 03/29/2023 1200 by Ardelle Lesches, RN  Positioning for Skin: Sitting in Chair  Taken 03/29/2023 1000 by Ardelle Lesches, RN  Positioning for Skin: Supine/Back  Taken 03/29/2023 0800 by Ardelle Lesches, RN  Positioning for Skin: Supine/Back  Taken 03/29/2023 0745 by Merlene Pulling, Alphonse Guild, RN  Positioning for Skin: Supine/Back  Goal: Optimal Comfort and Wellbeing  Outcome: Ongoing - Unchanged  Goal: Readiness for Transition of Care  Outcome: Ongoing - Unchanged  Goal: Rounds/Family Conference  Outcome: Ongoing - Unchanged     Problem: Infection  Goal: Absence of Infection Signs and Symptoms  Outcome: Ongoing - Unchanged  Intervention: Prevent or Manage Infection  Recent Flowsheet Documentation  Taken 03/29/2023 1200 by Ardelle Lesches, RN  Isolation Precautions: protective precautions maintained  Taken 03/29/2023 1000 by Ardelle Lesches, RN  Isolation Precautions: protective precautions maintained  Taken 03/29/2023 0800 by Ardelle Lesches, RN  Isolation Precautions: protective precautions maintained     Problem: Latex Allergy  Goal: Absence of Allergy Symptoms  Outcome: Ongoing - Unchanged     Problem: Oncology Care  Goal: Effective Coping  Outcome: Ongoing - Unchanged  Goal: Improved Activity Tolerance  Outcome: Ongoing - Unchanged  Goal: Optimal Oral Intake  Outcome: Ongoing - Unchanged  Goal: Improved Oral Mucous Membrane Integrity  Outcome: Ongoing - Unchanged  Goal: Optimal Pain Control and Function  Outcome: Ongoing - Unchanged     Problem: Self-Care Deficit  Goal: Improved Ability to Complete Activities of Daily Living  Outcome: Ongoing - Unchanged     Problem: Fall Injury Risk  Goal: Absence of Fall and Fall-Related Injury  Outcome: Ongoing - Unchanged  Intervention: Promote Injury-Free Environment  Recent Flowsheet Documentation  Taken 03/29/2023 1600 by Merlene Pulling, Alphonse Guild, RN  Safety Interventions: fall reduction program maintained   family at bedside   low bed   lighting adjusted for tasks/safety  Taken 03/29/2023 1400 by Merlene Pulling, Alphonse Guild, RN  Safety Interventions:   fall reduction program maintained   family at bedside   lighting adjusted for tasks/safety   low bed   nonskid shoes/slippers when out of bed  Taken 03/29/2023 1200 by Merlene Pulling, Alphonse Guild, RN  Safety Interventions:   fall reduction program maintained   low bed   lighting adjusted for tasks/safety   nonskid shoes/slippers when out of bed  Taken 03/29/2023 1000 by Merlene Pulling, Alphonse Guild, RN  Safety Interventions:   fall reduction program maintained   family at bedside   isolation precautions   lighting adjusted for tasks/safety   low bed   nonskid shoes/slippers when out of bed  Taken 03/29/2023 0800 by Merlene Pulling, Alphonse Guild, RN  Safety Interventions:   fall reduction program maintained   family at bedside   isolation precautions   lighting adjusted for tasks/safety   low bed   nonskid shoes/slippers when out of bed     Problem: Skin Injury Risk Increased  Goal: Skin Health and Integrity  Outcome: Ongoing - Unchanged  Intervention: Optimize Skin Protection  Recent Flowsheet Documentation  Taken 03/29/2023 1600 by Merlene Pulling, Alphonse Guild, RN  Pressure Reduction Techniques: frequent weight shift encouraged  Pressure Reduction Devices: pressure-redistributing mattress utilized  Taken 03/29/2023 1400 by Merlene Pulling, Alphonse Guild, RN  Pressure Reduction Techniques: frequent weight shift encouraged  Pressure Reduction Devices: pressure-redistributing mattress utilized  Taken 03/29/2023 1000 by Merlene Pulling, Alphonse Guild, RN  Pressure Reduction Techniques: frequent weight shift encouraged  Pressure Reduction Devices: pressure-redistributing mattress utilized  Taken 03/29/2023 0800 by Merlene Pulling, Alphonse Guild, RN  Pressure Reduction Techniques: frequent weight shift encouraged  Pressure Reduction Devices: pressure-redistributing mattress utilized  Taken 03/29/2023 0745 by Merlene Pulling, Alphonse Guild, RN  Pressure Reduction Techniques: frequent weight shift encouraged  Pressure Reduction Devices: pressure-redistributing mattress utilized     Problem: Pain Acute  Goal: Optimal Pain Control and Function  Outcome: Ongoing - Unchanged

## 2023-03-30 NOTE — Unmapped (Signed)
Vancomycin Therapeutic Monitoring Pharmacy Note    [name] [name] is a 23 y.o. female continuing vancomycin. Date of therapy initiation: 03/27/2023-03/28/2023, r/s on 03/29/2023     Indication: Bacteremia/Sepsis    Prior Dosing Information: Current regimen Vancomycin      Goals:  Therapeutic Drug Levels  Vancomycin trough goal: 10-15 mg/L    Additional Clinical Monitoring/Outcomes  Renal function, volume status (intake and output)    Results: Vancomycin level: 10.9 mg/L, drawn 1430 (true trough 11.2 mg/L)    Wt Readings from Last 1 Encounters:   03/27/23 51.1 kg (112 lb 9.6 oz)     Creatinine   Date Value Ref Range Status   03/30/2023 0.54 (L) 0.55 - 1.02 mg/dL Final   09/60/4540 9.81 (L) 0.55 - 1.02 mg/dL Final   19/14/7829 5.62 0.55 - 1.02 mg/dL Final        Pharmacokinetic Considerations and Significant Drug Interactions:  Adult (calculated on 1/20): Vd = 40.3 L, ke = 0.128 hr-1  Concurrent nephrotoxic meds: not applicable    Assessment/Plan:  Recommendation(s)  Continue current regimen of vancomycin 750 mg IV every 8 hours  Estimated trough on recommended regimen:  11.0 mg/L    Follow-up  Level due: prior to fourth or fifth dose  A pharmacist will continue to monitor and order levels as appropriate    Please page service pharmacist with questions/clarifications.    Viann Shove, PharmD

## 2023-03-31 LAB — COMPREHENSIVE METABOLIC PANEL
ALBUMIN: 3.6 g/dL (ref 3.4–5.0)
ALKALINE PHOSPHATASE: 166 U/L — ABNORMAL HIGH (ref 46–116)
ALT (SGPT): 249 U/L — ABNORMAL HIGH (ref 10–49)
ANION GAP: 10 mmol/L (ref 5–14)
AST (SGOT): 399 U/L — ABNORMAL HIGH (ref <=34)
BILIRUBIN TOTAL: 0.4 mg/dL (ref 0.3–1.2)
BLOOD UREA NITROGEN: 8 mg/dL — ABNORMAL LOW (ref 9–23)
BUN / CREAT RATIO: 15
CALCIUM: 9.8 mg/dL (ref 8.7–10.4)
CHLORIDE: 103 mmol/L (ref 98–107)
CO2: 30 mmol/L (ref 20.0–31.0)
CREATININE: 0.55 mg/dL (ref 0.55–1.02)
EGFR CKD-EPI (2021) FEMALE: >90 mL/min/{1.73_m2} (ref >=60)
GLUCOSE RANDOM: 124 mg/dL (ref 70–179)
POTASSIUM: 3.9 mmol/L (ref 3.4–4.8)
PROTEIN TOTAL: 6.4 g/dL (ref 5.7–8.2)
SODIUM: 143 mmol/L (ref 135–145)

## 2023-03-31 LAB — ADDON DIFFERENTIAL ONLY
BASOPHILS ABSOLUTE COUNT: 0 10*9/L (ref 0.0–0.1)
BASOPHILS RELATIVE PERCENT: 0.5 %
EOSINOPHILS ABSOLUTE COUNT: 0.2 10*9/L (ref 0.0–0.5)
EOSINOPHILS RELATIVE PERCENT: 3.2 %
LYMPHOCYTES ABSOLUTE COUNT: 1.8 10*9/L (ref 1.1–3.6)
LYMPHOCYTES RELATIVE PERCENT: 26.3 %
MONOCYTES ABSOLUTE COUNT: 0.7 10*9/L (ref 0.3–0.8)
MONOCYTES RELATIVE PERCENT: 10.4 %
NEUTROPHILS ABSOLUTE COUNT: 4 10*9/L (ref 1.8–7.8)
NEUTROPHILS RELATIVE PERCENT: 59.6 %

## 2023-03-31 LAB — CBC
HEMATOCRIT: 34.8 % (ref 34.0–44.0)
HEMOGLOBIN: 11.8 g/dL (ref 11.3–14.9)
MEAN CORPUSCULAR HEMOGLOBIN CONC: 34 g/dL (ref 32.0–36.0)
MEAN CORPUSCULAR HEMOGLOBIN: 27.2 pg (ref 25.9–32.4)
MEAN CORPUSCULAR VOLUME: 79.9 fL (ref 77.6–95.7)
MEAN PLATELET VOLUME: 9.9 fL (ref 6.8–10.7)
PLATELET COUNT: 209 10*9/L (ref 150–450)
RED BLOOD CELL COUNT: 4.35 10*12/L (ref 3.95–5.13)
RED CELL DISTRIBUTION WIDTH: 16.5 % — ABNORMAL HIGH (ref 12.2–15.2)
WBC ADJUSTED: 6.7 10*9/L (ref 3.6–11.2)

## 2023-03-31 LAB — MAGNESIUM: MAGNESIUM: 2.1 mg/dL (ref 1.6–2.6)

## 2023-03-31 LAB — PHOSPHORUS: PHOSPHORUS: 5.6 mg/dL — ABNORMAL HIGH (ref 2.4–5.1)

## 2023-03-31 LAB — VANCOMYCIN, TROUGH: VANCOMYCIN TROUGH: 15.3 ug/mL (ref 10.0–20.0)

## 2023-03-31 MED ADMIN — lidocaine (ASPERCREME) 4 % 1 patch: 1 | TRANSDERMAL | @ 14:00:00

## 2023-03-31 MED ADMIN — vancomycin (VANCOCIN) 750 mg in dextrose 5 % 150 mL IVPB (premix): 750 mg | INTRAVENOUS | @ 12:00:00 | Stop: 2023-03-31

## 2023-03-31 MED ADMIN — ondansetron (ZOFRAN) injection 8 mg: 8 mg | INTRAVENOUS | @ 19:00:00

## 2023-03-31 MED ADMIN — HYDROmorphone (PF) (DILAUDID) injection 1 mg: 1 mg | INTRAVENOUS | Stop: 2023-04-11

## 2023-03-31 MED ADMIN — vancomycin (VANCOCIN) 750 mg in dextrose 5 % 150 mL IVPB (premix): 750 mg | INTRAVENOUS | @ 19:00:00 | Stop: 2023-03-31

## 2023-03-31 MED ADMIN — sodium chloride (NS) 0.9 % flush 10 mL: 10 mL | INTRAVENOUS | @ 03:00:00

## 2023-03-31 MED ADMIN — ondansetron (ZOFRAN) injection 8 mg: 8 mg | INTRAVENOUS | @ 03:00:00

## 2023-03-31 MED ADMIN — HYDROmorphone (PF) (DILAUDID) injection 1 mg: 1 mg | INTRAVENOUS | @ 11:00:00 | Stop: 2023-04-11

## 2023-03-31 MED ADMIN — promethazine (PHENERGAN) 12.5 mg in sodium chloride (NS) 0.9 % 50 mL IVPB: 12.5 mg | INTRAVENOUS | @ 11:00:00

## 2023-03-31 MED ADMIN — vancomycin (VANCOCIN) 750 mg in dextrose 5 % 150 mL IVPB (premix): 750 mg | INTRAVENOUS | @ 03:00:00 | Stop: 2023-04-08

## 2023-03-31 MED ADMIN — promethazine (PHENERGAN) 12.5 mg in sodium chloride (NS) 0.9 % 50 mL IVPB: 12.5 mg | INTRAVENOUS | @ 17:00:00

## 2023-03-31 MED ADMIN — HYDROmorphone (PF) (DILAUDID) injection 1 mg: 1 mg | INTRAVENOUS | @ 18:00:00 | Stop: 2023-04-11

## 2023-03-31 MED ADMIN — promethazine (PHENERGAN) 12.5 mg in sodium chloride (NS) 0.9 % 50 mL IVPB: 12.5 mg | INTRAVENOUS | @ 05:00:00

## 2023-03-31 MED ADMIN — escitalopram (LEXAPRO) oral solution: 5 mg | ENTERAL | @ 14:00:00

## 2023-03-31 MED ADMIN — oxyCODONE (ROXICODONE) 5 mg/5 mL solution 20 mg: 20 mg | ORAL | @ 15:00:00 | Stop: 2023-04-09

## 2023-03-31 MED ADMIN — oxyCODONE (ROXICODONE) 5 mg/5 mL solution 20 mg: 20 mg | ORAL | @ 23:00:00 | Stop: 2023-04-09

## 2023-03-31 MED ADMIN — ammonium lactate (LAC-HYDRIN) 12 % lotion 1 Application: 1 | TOPICAL | @ 14:00:00

## 2023-03-31 MED ADMIN — diphenhydrAMINE (BENADRYL) injection: 50 mg | INTRAVENOUS | @ 05:00:00

## 2023-03-31 MED ADMIN — ondansetron (ZOFRAN) injection 8 mg: 8 mg | INTRAVENOUS | @ 11:00:00

## 2023-03-31 MED ADMIN — oxyCODONE (ROXICODONE) 5 mg/5 mL solution 20 mg: 20 mg | ORAL | @ 03:00:00 | Stop: 2023-04-09

## 2023-03-31 MED ADMIN — HYDROmorphone (PF) (DILAUDID) injection 1 mg: 1 mg | INTRAVENOUS | @ 04:00:00 | Stop: 2023-04-11

## 2023-03-31 MED ADMIN — diphenhydrAMINE (BENADRYL) injection: 50 mg | INTRAVENOUS | @ 21:00:00

## 2023-03-31 MED ADMIN — diphenhydrAMINE (BENADRYL) oral elixir: 50 mg | ENTERAL | @ 18:00:00

## 2023-03-31 MED ADMIN — oxyCODONE (ROXICODONE) 5 mg/5 mL solution 20 mg: 20 mg | ORAL | @ 08:00:00 | Stop: 2023-04-09

## 2023-03-31 MED ADMIN — sodium chloride (NS) 0.9 % flush 10 mL: 10 mL | INTRAVENOUS | @ 19:00:00

## 2023-03-31 MED ADMIN — diphenhydrAMINE (BENADRYL) injection: 50 mg | INTRAVENOUS | @ 14:00:00

## 2023-03-31 MED ADMIN — sodium chloride (NS) 0.9 % flush 10 mL: 10 mL | INTRAVENOUS | @ 11:00:00
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
## 2023-03-31 NOTE — Unmapped (Signed)
Department of Anesthesiology  Pain Medicine Division    Chronic Pain inpatient Consult follow up Note      Requesting Attending Physician:  [name], MD  Service Requesting Consult:  [facility])    Assessment/Recommendations:  The patient was seen in consultation on request of [name], MD regarding assistance with pain management. The patient is not obtaining adequate pain relief on current medication regimen.     [name] [name] is a 23 year old female with a complex medical history, including DVT (RUE), [name] syndrome (post-colectomy), desmoid tumors, anemia, and severe protein-calorie malnutrition. She is followed at [facility] for chronic abdominal and right shoulder pain, consistent with cancer-associated pain. 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 Trana???s pain has worsened in recent months, likely due to intermittent intestinal obstructions related to her mesenteric desmoid tumors, which may cause intermittent bowel intussusception. Her pain is further complicated by narcotic bowel syndrome, visceral hyperalgesia, and a positive feedback loop where increased opioid use exacerbates the pain. Anaisabel???s previous hospitalization was complicated by ileus, requiring NG tube, a prolonged inpatient rehab stay.  During her most recent hospitalization she was found to have some pouchitis and was continued on oral antibiotic.     She presented for an outpatient iron infusion but experienced a flare-up of her chronic abdominal pain, and significant nausea nausea. She was admitted to the [facility] emergency department for intractable acute on chronic pain, significant nausea, some hematochezia, dehydration.    Emergency department had contacted [name] on the evening of 03/25/2023 who had provided general recommendation for treatment plan.  Please review his note dated 03/25/2023.    I contacted Abrie's primary pain provider, [name], who recommended the following treatment plan:  Continue Butrans patch at 20 mcg/h.  Increase oxycodone liquid to 15-20 mg every 4 hours as needed  Continue HM IV 1 mg q4 hrs.  Avoid ketamine infusion at this time.  Aggressively address nausea symptoms to be able to take PO meds    This plan was discussed with [name] [name], who agreed to the recommendations.     Interval: NAEO. On 1/19 She recevied Corpak feeding tube. Started on tube feeds. Will continue Vanc for additional 2 days. No vomiting. Still abdominal pain unchanged.     Utilized liquid Oxy 20mg  x4 and IV HM x4 in last 24 hours. Patient has no interest in making changes to pain regimen today while adjusting to tube feeds. Did not discuss making any adjustments to pain regimen today. Will reassess Wednesday. In discussion with primary team, to complete vancomyocin treatment in 48 hours with plans to discharge around that time.      Recommendations:  -The chronic pain service is a consult service and does not place orders, just makes recommendations (except ketamine and lidocaine infusions)  -Please evaluate all patients on opioids for appropriateness of prescribing narcan at discharge.  The chronic pain service can assist with this.  Nasal narcan covered by most insurance.  -Recommendations given apply to the current hospitalization and do not reflect long term recommendations.     Recommended to   -Continue Butrans patch 20 mcg/h q. Weekly  -Aggressively address nausea symptoms per primary team choice  -Continue to support patient from multidisciplinary approach   -Continue oxycodone liquid 20 mg every 4 hours as needed (1st line therapy for severe pain), please encourage patient to utilize this modality,  unless she has intractable vomiting despite antiemetic  -Continue hydromorphone 1 mg IV every 4 hours as needed (2nd line therapy for severe pain). SECOND LINE, do NOT give unless patient has received Oxycodone liquid PRN within prior 2-3 hours, and pain is still severe intensity. With plan to discuss weaning again on Wednesday. Primary team can also initiate discussion for weaning with the patient. In the past patient had been given option for either weaning dose or frequency, per patient preference.    -Continue Tylenol 1 g 3 times daily    We will continue to follow her. Chronic pain service will be available to contact via pager.     Please prescribe naloxone at discharge.  Nasal narcan for most insured (Nasal narcan 4mg /actuation, prescribe 1 kit, instructions at [email or web address]).  For uninsured, chronic pain can work to assist in finding an option.  OTC nasal narcan now available at most pharmacies for around $45.    Allergies    Allergies   Allergen Reactions    Adhesive Tape-Silicones Hives and Rash     Paper tape and Tegederm ok. Biopatch causes blistering but has tolerated CHG Tegaderm    Ferrlecit [Sodium Ferric Gluconat-Sucrose] Swelling and Rash    Levofloxacin Swelling and Rash     Swelling in mouth, rash,     Methylnaltrexone      Per Patient: I lost bowel control, severe abdominal cramping, and elevated BP    Neomycin Swelling     Rxn after ear drops; ear swelling    Papaya Hives    Morphine Nausea And Vomiting    Zosyn [Piperacillin-Tazobactam] Itching and Rash     Red and itchy    Compazine [Prochlorperazine] Other (See Comments)     Extreme agitation    Iron Analogues     Reglan [Metoclopramide Hcl] Diarrhea    Iron Dextran Itching     Received iron dextran 06/08/22 over 12 hours, had itching and redness/flushing during the infusion and for a couple days after. Required IV benadryl w/flares between doses and ultimately treated w/IV methylpred for 2 days.     Latex, Natural Rubber Rash       Home Medications    Medications Prior to Admission   Medication Sig Dispense Refill Last Dose/Taking    acetaminophen (TYLENOL) 500 MG tablet Take 2 tablets (1,000 mg total) by mouth every eight (8) hours.       buprenorphine (BUTRANS) 20 mcg/hour PTWK transdermal patch Place 1 patch on the skin every seven (7) days. 4 patch 1     cefdinir (OMNICEF) 300 MG capsule Take 1 capsule (300 mg total) by mouth every twelve (12) hours for 19 days, THEN 1 capsule (300 mg total) daily for 28 days. 66 capsule 0     COURIERED MED OR SUPPLY OGSVEO 50 MG TABLET. 1 each 0     escitalopram oxalate (LEXAPRO) 5 MG tablet Take 1 tablet (5 mg total) by mouth daily. 30 tablet 0     famotidine (PEPCID) 20 MG tablet Take 1 tablet (20 mg total) by mouth nightly. 30 tablet 3     fluticasone propionate (FLONASE) 50 mcg/actuation nasal spray Use 1 spray under the butrans patch once every 7 days to prevent allergic reaction to the adhesive       hydrocortisone 2.5 % cream Apply topically two (2) times a day as needed (hemorrhoids). 30 g 0     lidocaine (ASPERCREME) 4 % patch Place 1 patch on the skin daily as needed.  LORazepam (ATIVAN) 1 MG tablet Take 1 tablet (1 mg total) by mouth two (2) times a day as needed for anxiety. 60 tablet 0     naloxegol (MOVANTIK) 12.5 mg tablet Take 1 tablet (12.5 mg total) by mouth daily. (Patient taking differently: Take 1 tablet (12.5 mg total) by mouth daily as needed.) 30 tablet 0     naloxone (NARCAN) 4 mg nasal spray One spray in either nostril once for known/suspected opioid overdose. May repeat every 2-3 minutes in alternating nostril til EMS arrives 2 each 0     nirogacestat (OGSIVEO) 50 mg tablet Take 3 tablets (150 mg total) by mouth two (2) times a day. Swallow tablets whole; do not break, crush, or chew. 180 tablet 5     ondansetron (ZOFRAN-ODT) 8 MG disintegrating tablet Dissolve 1 tablet (8 mg total) in the mouth every eight (8) hours as needed for nausea. 90 tablet 0     [EXPIRED] oxyCODONE (ROXICODONE) 5 mg/5 mL solution Take 20 mL (20 mg total) by mouth every four (4) hours as needed for up to 7 days. 840 mL 0     pantoprazole (PROTONIX) 40 MG tablet Take 1 tablet (40 mg total) by mouth daily before breakfast. 30 tablet 0     promethazine (PHENERGAN) 12.5 MG tablet Take 1 tablet (12.5 mg total) by mouth every twelve (12) hours as needed for nausea. 14 tablet 0     scopolamine (TRANSDERM-SCOP) 1 mg over 3 days Place 1 patch (1 mg total) on the skin every third day. 10 patch 2     sucralfate (CARAFATE) 100 mg/mL suspension Take 10 mL (1 g total) by mouth four (4) times a day as needed.       zinc oxide-cod liver oil (DESITIN 40%) 40 % Pste Apply topically daily as needed. 57 g 0        Inpatient Medications  Current Facility-Administered Medications   Medication Dose Route Frequency Provider Last Rate Last Admin    acetaminophen (TYLENOL) oral liquid  1,000 mg Enteral tube: post-pyloric (duodenum, jejunum) Q8H SCH [name], MD        ammonium lactate (LAC-HYDRIN) 12 % lotion 1 Application  1 Application Topical BID [name], MD   1 Application at 03/30/23 1402    buprenorphine 20 mcg/hour transdermal patch 1 patch  1 patch Transdermal Weekly Dholakia, Vibhaben, AGNP   1 patch at 03/26/23 0027    cetirizine (ZYRTEC) oral syrup  20 mg Enteral tube: post-pyloric (duodenum, jejunum) BID [name], MD        cholecalciferol (vitamin D3 25 mcg (1,000 units)) tablet 25 mcg  25 mcg Oral Daily Dholakia, Vibhaben, AGNP        diphenhydrAMINE (BENADRYL) injection  50 mg Intravenous Daily PRN Arman Filter, MD   50 mg at 03/31/23 4098    diphenhydrAMINE (BENADRYL) oral elixir  50 mg Enteral tube: post-pyloric (duodenum, jejunum) Q8H [name], MD        escitalopram Judye Bos) oral solution  5 mg Enteral tube: post-pyloric (duodenum, jejunum) Daily [name], MD        famotidine (PEPCID) oral suspension  20 mg Enteral tube: post-pyloric (duodenum, jejunum) Nightly [name], MD        fluticasone propionate (FLONASE) 50 mcg/actuation nasal spray 1 spray  1 spray Topical Weekly Dholakia, Vibhaben, AGNP   1 spray at 03/26/23 0027    hydrocortisone 2.5 % cream   Topical BID PRN Dholakia,  Vibhaben, AGNP        HYDROmorphone (PF) (DILAUDID) injection 1 mg  1 mg Intravenous Q4H PRN [name], MD   1 mg at 03/31/23 0620    IP OKAY TO TREAT   Other Continuous PRN Dholakia, Vibhaben, AGNP        IP OKAY TO TREAT   Other Continuous PRN Dholakia, Vibhaben, AGNP        IP OKAY TO TREAT   Other Continuous PRN Dholakia, Vibhaben, AGNP        lidocaine (ASPERCREME) 4 % 1 patch  1 patch Transdermal Daily Dholakia, Vibhaben, AGNP   1 patch at 03/30/23 0832    LORazepam (ATIVAN) tablet 1 mg  1 mg Oral BID PRN Dholakia, Vibhaben, AGNP   1 mg at 03/29/23 1243    melatonin tablet 3 mg  3 mg Oral Nightly PRN [name], MD        mirtazapine (REMERON) tablet 7.5 mg  7.5 mg Oral Nightly [name], MD        multivitamin with folic acid 400 mcg tablet 1 tablet  1 tablet Oral Daily [name], MD        naloxegol (MOVANTIK) 12.5 mg tablet 12.5 mg  12.5 mg Oral Daily PRN Dholakia, Vibhaben, AGNP        nirogacestat (OGSIVEO) tablet 150 mg  150 mg Oral BID Dholakia, Vibhaben, AGNP        ondansetron (ZOFRAN) injection 8 mg  8 mg Intravenous Q8H Dholakia, Vibhaben, AGNP   8 mg at 03/31/23 3016    oxyCODONE (ROXICODONE) 5 mg/5 mL solution 20 mg  20 mg Oral Q4H PRN [name], MD   20 mg at 03/31/23 0234    pantoprazole (Protonix) EC tablet 40 mg  40 mg Oral Daily before breakfast Dholakia, Vibhaben, AGNP        promethazine (PHENERGAN) 12.5 mg in sodium chloride (NS) 0.9 % 50 mL IVPB  12.5 mg Intravenous Q6H [name], MD   Stopped at 03/31/23 0645    scopolamine (TRANSDERM-SCOP) 1 mg over 3 days topical patch 1 mg  1 patch Topical Q72H Dholakia, Vibhaben, AGNP   1 mg at 03/29/23 0908    sodium chloride (NS) 0.9 % flush 10 mL  10 mL Intravenous Q8H [name], MD   10 mL at 03/31/23 0109    thiamine mononitrate (vit B1) tablet 200 mg  200 mg Oral Daily [name], MD vancomycin (VANCOCIN) 750 mg in dextrose 5 % 150 mL IVPB (premix)  750 mg Intravenous Q8H [name], MD 200 mL/hr at 03/31/23 0648 750 mg at 03/31/23 3235         Past Medical History    Past Medical History:   Diagnosis Date    Abdominal pain     Acid reflux     occas    Anesthesia complication     itching, shaking, coldness; last few surgeries have gone much better    Cancer (CMS-HCC)     Cataract of right eye     COVID-19 virus infection 01/2019    Cyst of thyroid determined by ultrasound     monitoring    [name]     2 right forearm, 1 left thigh, 1 right scapula, 1 under left clavicle; multiple    Difficult intravenous access     [name] (familial adenomatous polyposis)     [name]     Gastric polyps  History of chemotherapy     last treatment approx 05/2019    History of colon polyps     History of COVID-19 01/2019    Ileus (CMS-HCC) 03/16/2022    Iron deficiency anemia due to chronic blood loss     received iron infusion 11-2019    PONV (postoperative nausea and vomiting)     Rectal bleeding     Syncopal episodes     especially if becoming dehydrated           Past Surgical History    Past Surgical History:   Procedure Laterality Date    COLON SURGERY      cyroablation      cystis removal      desmoid removal      PR CLOSE ENTEROSTOMY,RESEC+ANAST N/A 10/09/2020    Procedure: ILEOSTOMY TAKEDOWN;  Surgeon: Mickle Asper, MD;  Location: OR [place];  Service: General Surgery    PR COLONOSCOPY W/BIOPSY SINGLE/MULTIPLE N/A 10/27/2012    Procedure: COLONOSCOPY, FLEXIBLE, PROXIMAL TO SPLENIC FLEXURE; WITH BIOPSY, SINGLE OR MULTIPLE;  Surgeon: Shirlyn Goltz Mir, MD;  Location: PEDS PROCEDURE ROOM [facility];  Service: Gastroenterology    PR COLONOSCOPY W/BIOPSY SINGLE/MULTIPLE N/A 09/14/2013    Procedure: COLONOSCOPY, FLEXIBLE, PROXIMAL TO SPLENIC FLEXURE; WITH BIOPSY, SINGLE OR MULTIPLE;  Surgeon: Shirlyn Goltz Mir, MD;  Location: PEDS PROCEDURE ROOM [place];  Service: Gastroenterology    PR COLONOSCOPY W/BIOPSY SINGLE/MULTIPLE N/A 11/08/2014    Procedure: COLONOSCOPY, FLEXIBLE, PROXIMAL TO SPLENIC FLEXURE; WITH BIOPSY, SINGLE OR MULTIPLE;  Surgeon: Arnold Long Mir, MD;  Location: PEDS PROCEDURE ROOM [facility];  Service: Gastroenterology    PR COLONOSCOPY W/BIOPSY SINGLE/MULTIPLE N/A 12/26/2015    Procedure: COLONOSCOPY, FLEXIBLE, PROXIMAL TO SPLENIC FLEXURE; WITH BIOPSY, SINGLE OR MULTIPLE;  Surgeon: Arnold Long Mir, MD;  Location: PEDS PROCEDURE ROOM [facility];  Service: Gastroenterology    PR COLONOSCOPY W/BIOPSY SINGLE/MULTIPLE N/A 09/02/2017    Procedure: COLONOSCOPY, FLEXIBLE, PROXIMAL TO SPLENIC FLEXURE; WITH BIOPSY, SINGLE OR MULTIPLE;  Surgeon: Arnold Long Mir, MD;  Location: PEDS PROCEDURE ROOM [place];  Service: Gastroenterology    PR COLSC FLX W/REMOVAL LESION BY HOT BX FORCEPS N/A 08/27/2016    Procedure: COLONOSCOPY, FLEXIBLE, PROXIMAL TO SPLENIC FLEXURE; W/REMOVAL TUMOR/POLYP/OTHER LESION, HOT BX FORCEP/CAUTE;  Surgeon: Arnold Long Mir, MD;  Location: PEDS PROCEDURE ROOM [facility];  Service: Gastroenterology    PR COLSC FLX W/RMVL OF TUMOR POLYP LESION SNARE TQ N/A 02/25/2019    Procedure: COLONOSCOPY FLEX; W/REMOV TUMOR/LES BY SNARE;  Surgeon: Helyn Numbers, MD;  Location: [facility] PROCEDURES [place] [facility];  Service: Gastroenterology    PR COLSC FLX W/RMVL OF TUMOR POLYP LESION SNARE TQ N/A 03/13/2020    Procedure: COLONOSCOPY FLEX; W/REMOV TUMOR/LES BY SNARE;  Surgeon: Helyn Numbers, MD;  Location: [facility] PROCEDURES [place] [facility];  Service: Gastroenterology    PR EXC SKIN BENIG 2.1-3 CM TRUNK,ARM,LEG Right 02/25/2017    Procedure: EXCISION, BENIGN LESION INCLUDE MARGINS, EXCEPT SKIN TAG, LEGS; EXCISED DIAMETER 2.1 TO 3.0 CM;  Surgeon: Clarene Duke, MD;  Location: CHILDRENS OR [facility];  Service: Plastics    PR EXC SKIN BENIG 3.1-4 CM TRUNK,ARM,LEG Right 02/25/2017    Procedure: EXCISION, BENIGN LESION INCLUDE MARGINS, EXCEPT SKIN TAG, ARMS; EXCISED DIAMETER 3.1 TO 4.0 CM;  Surgeon: Clarene Duke, MD;  Location: CHILDRENS OR [facility];  Service: Plastics    PR EXC SKIN BENIG >4 CM FACE,FACIAL Right 02/25/2017    Procedure: EXCISION, OTHER BENIGN LES INCLUD MARGIN, FACE/EARS/EYELIDS/NOSE/LIPS/MUCOUS MEMBRANE; EXCISED DIAM >4.0 CM;  Surgeon: Clare Gandy  Lucretia Roers, MD;  Location: CHILDRENS OR [facility];  Service: Plastics    PR EXC TUMOR SOFT TISSUE LEG/ANKLE SUBQ 3+CM Right 08/05/2019    Procedure: EXCISION, TUMOR, SOFT TISSUE OF LEG OR ANKLE AREA, SUBCUTANEOUS; 3 CM OR GREATER;  Surgeon: Arsenio Katz, MD;  Location: MAIN OR [place];  Service: Plastics    PR EXC TUMOR SOFT TISSUE LEG/ANKLE SUBQ <3CM Right 08/05/2019    Procedure: EXCISION, TUMOR, SOFT TISSUE OF LEG OR ANKLE AREA, SUBCUTANEOUS; LESS THAN 3 CM;  Surgeon: Arsenio Katz, MD;  Location: MAIN OR [facility];  Service: Plastics    PR LAP, SURG PROCTECTOMY W J-POUCH N/A 08/10/2020    Procedure: ROBOTIC ASSISTED LAPAROSCOPIC PROCTOCOLECTOMY, ILEAL J POUCH, WITH OSTOMY;  Surgeon: Mickle Asper, MD;  Location: OR [place];  Service: General Surgery    PR NDSC EVAL INTSTINAL POUCH DX W/COLLJ SPEC SPX N/A 01/23/2021    Procedure: ENDO EVAL SM INTEST POUCH; DX;  Surgeon: Modena Nunnery, MD;  Location: [facility] PROCEDURES [place] [facility];  Service: Gastroenterology    PR NDSC EVAL INTSTINAL POUCH DX W/COLLJ SPEC SPX N/A 08/27/2021    Procedure: ENDO EVAL SM INTEST POUCH; DX;  Surgeon: Hunt Oris, MD;  Location: GI PROC[facility]URES [facility];  Service: Gastroenterology    PR NDSC EVAL INTSTINAL POUCH DX W/COLLJ SPEC SPX N/A 12/09/2021    Procedure: ENDO EVAL SM INTEST POUCH; DX;  Surgeon: Vidal Schwalbe, MD;  Location: GI PROCEDURES [facility];  Service: Gastroenterology    PR NDSC EVAL INTSTINAL POUCH DX W/COLLJ [facility] SPX Left 04/09/2022    Procedure: ENDO EVAL SM INTEST POUCH; DX;  Surgeon: Modena Nunnery, MD;  Location: [facility] [place] [facility];  Service: Gastroenterology    PR NDSC EVAL INTSTINAL POUCH DX W/COLLJ SPEC SPX N/A 08/05/2022    Procedure: ENDO EVAL SM INTEST POUCH; DX;  Surgeon: Modena Nunnery, MD;  Location: GI PROCEDURES [facility];  Service: Gastroenterology    PR NDSC EVAL INTSTINAL POUCH DX W/COLLJ SPEC SPX N/A 03/13/2023    Procedure: ENDO EVAL SM INTEST POUCH; DX;  Surgeon: Carmon Ginsberg, MD;  Location: GI PROCEDURES [facility];  Service: Gastroenterology    PR NDSC EVAL INTSTINAL POUCH W/BX SINGLE/MULTIPLE N/A 01/20/2022    Procedure: ENDOSCOPIC EVAL OF SMALL INTESTINAL POUCH; DIAGNOSTIC, No biopsies;  Surgeon: Andrey Farmer, MD;  Location: GI PROCEDURES [facility];  Service: Gastroenterology    PR NDSC EVAL INTSTINAL POUCH W/BX SINGLE/MULTIPLE N/A 02/13/2022    Procedure: ENDOSCOPIC EVAL OF SMALL INTESTINAL POUCH; DIAGNOSTIC, WITH BIOPSY;  Surgeon: Bronson Curb, MD;  Location: GI PROCEDURES [facility];  Service: Gastroenterology    PR NDSC EVAL INTSTINAL POUCH W/BX SINGLE/MULTIPLE N/A 03/13/2023    Procedure: ENDOSCOPIC EVAL OF SMALL INTESTINAL POUCH; DIAGNOSTIC, WITH BIOPSY;  Surgeon: Carmon Ginsberg, MD;  Location: GI PROCEDURES [facility];  Service: Gastroenterology    PR UNLISTED PROCEDURE SMALL INTESTINE  01/23/2021    Procedure: UNLISTED PROCEDURE, SMALL INTESTINE;  Surgeon: Modena Nunnery, MD;  Location: [facility]URES [place] [facility];  Service: Gastroenterology    PR UNLISTED PROCEDURE SMALL INTESTINE  02/13/2022    Procedure: UNLISTED PROCEDURE, SMALL INTESTINE;  Surgeon: Bronson Curb, MD;  Location: GI PROCEDURES [facility];  Service: Gastroenterology    PR UPPER GI ENDOSCOPY,BIOPSY N/A 10/27/2012    Procedure: UGI ENDOSCOPY; WITH BIOPSY, SINGLE OR MULTIPLE;  Surgeon: Shirlyn Goltz Mir, MD;  Location: PEDS PROCEDURE ROOM [facility];  Service: Gastroenterology    PR UPPER GI ENDOSCOPY,BIOPSY  N/A 09/14/2013    Procedure: UGI ENDOSCOPY; WITH BIOPSY, SINGLE OR MULTIPLE;  Surgeon: Shirlyn Goltz Mir, MD;  Location: PEDS PROCEDURE ROOM [facility];  Service: Gastroenterology PR UPPER GI ENDOSCOPY,BIOPSY N/A 11/08/2014    Procedure: UGI ENDOSCOPY; WITH BIOPSY, SINGLE OR MULTIPLE;  Surgeon: Arnold Long Mir, MD;  Location: PEDS PROCEDURE ROOM [facility];  Service: Gastroenterology    PR UPPER GI ENDOSCOPY,BIOPSY N/A 12/26/2015    Procedure: UGI ENDOSCOPY; WITH BIOPSY, SINGLE OR MULTIPLE;  Surgeon: Arnold Long Mir, MD;  Location: PEDS PROCEDURE ROOM [facility];  Service: Gastroenterology    PR UPPER GI ENDOSCOPY,BIOPSY N/A 08/27/2016    Procedure: UGI ENDOSCOPY; WITH BIOPSY, SINGLE OR MULTIPLE;  Surgeon: Arnold Long Mir, MD;  Location: PEDS PROCEDURE ROOM [facility];  Service: Gastroenterology    PR UPPER GI ENDOSCOPY,BIOPSY N/A 09/02/2017    Procedure: UGI ENDOSCOPY; WITH BIOPSY, SINGLE OR MULTIPLE;  Surgeon: Arnold Long Mir, MD;  Location: PEDS PROCEDURE ROOM [facility];  Service: Gastroenterology    PR UPPER GI ENDOSCOPY,BIOPSY N/A 03/13/2020    Procedure: UGI ENDOSCOPY; WITH BIOPSY, SINGLE OR MULTIPLE;  Surgeon: Helyn Numbers, MD;  Location: [facility] [place] [facility];  Service: Gastroenterology    PR UPPER GI ENDOSCOPY,BIOPSY N/A 09/05/2021    Procedure: UGI ENDOSCOPY; WITH BIOPSY, SINGLE OR MULTIPLE;  Surgeon: Wendall Papa, MD;  Location: GI PROCEDURES [facility];  Service: Gastroenterology    PR UPPER GI ENDOSCOPY,DIAGNOSIS N/A 01/20/2022    Procedure: UGI ENDO, INCLUDE ESOPHAGUS, STOMACH, & DUODENUM &/OR JEJUNUM; DX W/WO COLLECTION SPECIMN, BY BRUSH OR WASH;  Surgeon: Andrey Farmer, MD;  Location: GI PROCEDURES [facility];  Service: Gastroenterology    TUMOR REMOVAL      multiple-head, neck, back, hand, right flank, multiple           Family History    Family History   Problem Relation Age of Onset    No Known Problems Mother     No Known Problems Father     No Known Problems Sister     No Known Problems Brother     Stroke Maternal Grandmother     Other Maternal Grandmother         benign lesions of liver and pancreas, further details unknown    Cancer Maternal Grandmother Diabetes Maternal Grandmother     Hypertension Maternal Grandmother     Thyroid disease Maternal Grandmother     Arthritis Maternal Grandfather     Asthma Maternal Grandfather     COPD Paternal Grandmother         Deceased    Miscarriages / Stillbirths Paternal Grandmother     Alcohol abuse Paternal Grandfather         Deceased    No Known Problems Maternal Aunt     No Known Problems Maternal Uncle     No Known Problems Paternal Aunt     No Known Problems Paternal Uncle     Anesthesia problems Neg Hx     Broken bones Neg Hx     Cancer Neg Hx     Clotting disorder Neg Hx     Collagen disease Neg Hx     Diabetes Neg Hx     Dislocations Neg Hx     Fibromyalgia Neg Hx     Gout Neg Hx     Hemophilia Neg Hx     Osteoporosis Neg Hx     Rheumatologic disease Neg Hx     Scoliosis Neg Hx     Severe sprains Neg Hx  Sickle cell anemia Neg Hx     Spinal Compression Fracture Neg Hx     Melanoma Neg Hx     Basal cell carcinoma Neg Hx     Squamous cell carcinoma Neg Hx          Social History:  Social History     Tobacco Use   Smoking Status Never    Passive exposure: Past   Smokeless Tobacco Never     Social History     Substance and Sexual Activity   Alcohol Use Never     Social History     Substance and Sexual Activity   Drug Use Never         Lab Results   Component Value Date    CREATININE 0.55 03/31/2023       Lab Results   Component Value Date    ALKPHOS 166 (H) 03/31/2023    BILITOT 0.4 03/31/2023    BILIDIR <0.10 03/17/2023    PROT 6.4 03/31/2023    ALBUMIN 3.6 03/31/2023    ALT 249 (H) 03/31/2023    AST 399 (H) 03/31/2023       Encounter Date: 03/25/23   ECG 12 Lead   Result Value    EKG Systolic BP     EKG Diastolic BP     EKG Ventricular Rate 84    EKG Atrial Rate 84    EKG P-R Interval 126    EKG QRS Duration 86    EKG Q-T Interval 368    EKG QTC Calculation 434    EKG Calculated P Axis 51    EKG Calculated R Axis 32    EKG Calculated T Axis 20    QTC Fredericia 411    Narrative    **POOR DATA QUALITY, INTERPRETATION MAY BE ADVERSELY AFFECTED  NORMAL SINUS RHYTHM WITH SINUS ARRHYTHMIA  NORMAL ECG  WHEN COMPARED WITH ECG OF 15-Mar-2023 11:17,  NO SIGNIFICANT CHANGE WAS FOUND  Confirmed by Christella Noa ([id number]) on 03/29/2023 11:11:35 AM       No results found for requested labs within last 30 days.       Objective:     Vital Signs  Temp:  [36.4 ??C (97.5 ??F)-36.8 ??C (98.2 ??F)] 36.6 ??C (97.9 ??F)  Heart Rate:  [78-104] 78  Resp:  [16-18] 18  BP: (98-105)/(57-72) 98/58  MAP (mmHg):  [69-83] 69  SpO2:  [96 %-100 %] 100 %    Physical Exam    GENERAL:  Well developed, well-nourished female and is in no apparent distress.  HEAD/NECK:    Reveals normocephalic/atraumatic. Corpak tube in place  CARDIOVASCULAR:   Regular rate  LUNGS:   Normal work of breathing, no supplemental 02  EXTREMITIES:  Warm, no clubbing, cyanosis, or edema was noted.  NEUROLOGIC:    The patient was alert and oriented times four with normal language, attention, cognition and memory. Cranial nerve exam was grossly normal.  MUSCULOSKELETAL:    Moving all 4 extremities  SKIN:  No obvious rashes lesions or erythema  PSY:  Appropriate affect and mood.      Problem List    Principal Problem:    Cancer associated pain  Active Problems:    [name]    Pain medication agreement signed    Pouchitis (CMS-HCC)

## 2023-03-31 NOTE — Unmapped (Signed)
Hospital Medicine Daily Progress Note    Assessment/Plan:    Principal Problem:    Cancer associated pain  Active Problems:    [name]    Pain medication agreement signed    Pouchitis (CMS-HCC)   Malnutrition Evaluation as performed by RD, LDN: Severe Protein-Calorie Malnutrition in the context of chronic illness (03/27/23 1311)             [name] [name] is a 23 y.o. 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     Positive blood culture for Strep mitis  Blood cultures 03/25/23 (ordered by ED on presentation for workup of poor PO intake) growing Strep mitis in 1/2 sets. Unclear if this is true bacteremia or contaminant as she was afebrile, without leukocytosis or sepsis on admission. If true infection, low suspicion for odontogenic source given good dentition and regular dental care; GI source is consideration, but GI luminal team has low suspicion for recent pouchitis as source as inflammation was minimal on recent pouchoscopy, and she has been taking cefdinir since then. She was started on empiric vancomycin c/b Redman syndrome and itching, but no evidence of airway compromise. Case discussed with ID; there is significant ceftriaxone-resistance in Strep mitis and patient with prior polymicrobial bacteremia in setting of TPN, so recommend treating with vanc and ertapenem for broad coverage initially. Stopped ertapenem when blood cultures showed no other bacteria at 72 hrs. Patient was off vancomycin on 1/18-1/19 due to no IV access. Vancomycin was resumed 1/19.  - Continue IV vancomycin (1/17- ) with plan to complete a 7 day course ending on 1/22  - Follow up final blood cultures and Strep mitis sensitivities  - Repeat blood cultures for clearance show NGTD  - PICC line placed in left arm on 1/19  - S/p ertapenem 1/17-1/18, discontinued 1/18 given no other bacteria growing on blood cultures    Elevated liver enzymes  Previously normal, AST/ALT rose to 399/249 with alk phos 166 and normal Tbili 0.4 on 03/31/23. The only medications she is taking are IV zofran, IV phenergan, po oxycodone, IV dilaudid, and IV vancomycin. This could be due to vancomycin in which case it would be self limiting. She only has one day of vancomycin left.  - Trend daily hepatic function panel    Poor PO intake / Nausea  Presyncopal episodes at home  Severe Protein-Calorie Malnutrition in the context of chronic illness (03/27/23 1311)  Energy Intake: < or equal to 75% of estimated energy requirement for > or equal to 1 month  Interpretation of Wt. Loss: > 7.5% x 3 month  Malnutrition Score: 2  Patient presented as she felt she needed to be back on TPN. This has been discussed with her primary outpatient providers including Dr. [name], Dr. Carmon Sails, Dr. [name], and [name] her NP who do not see a current role for TPN given that she has a function GI system. She is not a TPN candidate at [facility] now or for the forseeable future. In addition, her new positive blood culture this admission precludes her from TPN. She notes poor PO intake but states that she has been unable to keep down small amounts of crackers and liquid. She does not usually vomit but has felt very nauseated and feels that her chronic abdominal pain is worsened by eating. She currently does  not feel able to take PO medications and requests IV alternatives. She is hemodynamically stable, moist mucous membranes on exam. Labs extremely reassuring with normal electrolytes, albumin 3.7 on admission. Her weight is 116 lbs on admission, which is down from last fall when she had significant edema but fairly stable over the past 6 months (119 July 2024).   - Corpak placed 1/19, confirmed post-pyloric on XR  - Enteral nutrition Osmolite 1.5 per nutrition recs, slowly up-titrating to goal but patient has resisted increasing due to she feels it is making her sick  - Appreciate GI recommendations  - Appreciate dietician recommendations  - Nausea mgmt: Zofran 8 mg IV q8h scheduled & Phenergan 12.5 mg IV q6h PRN --> Will transition to oral as able  - Continue home scopolamine patch  - Added Remeron 7.5 mg nightly on 1/18 for sleep and nausea but patient has thus far declined due to feeling that she is unable to take oral medications    Acute on chronic abdominal pain  In addition to nausea as above, patient presents due to uncontrolled abdominal pain, worse in the lower quadrants. No peritonitic signs on exam. Follows with chronic pain, see treatment plan from 1/15.  - Appreciate chronic pain recommendations  - Continue home butrans patch  - Patient is declining her home scheduled tylenol due to feeling she cannot take oral medications  - Oxycodone liquid 20 mg q4h prn   - Continue Dilaudid 1 mg IV q4h prn severe pain only to be used as 2nd line if pain does not improve 1 hour after receiving oxycodone -- This plan was discussed with the patient as well as the GI team and Dr. Sibyl Parr, the attending on the chronic pain service.  - Do not escalate frequency of IV dilaudid without contacting chronic pain first    Recent mild pouchitis  Ongoing bright red blood per rectum  Patient with total abdominal colectomy with J pouch in 2022. Recent admission (12/29-1/10) for pouchitis, with pouchoscopy 03/13/23 showing minimal inflammation consistent with healing pouchitis. She has been taking cefdinir for planned 8 week course (300 mg BID 1/1-1/28, then 300 mg daily 1/29-2/25). She continues to have some BRBPR with each bowel movement and reports frequent watery stools. Her H/H stable on admission (Hgb 12.8) and VSS.   - Appreciate GI recommendations  - No indication for repeat pouchoscopy  - No need for GIPP or C diff testing per GI team given chronic loose stools  - Holding cefdinir given patient is on vancomycin and feels she cannot tolerate oral antibiotics at this time. GI team aware.    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 [name]. Patient has numerous sites of disease but most bothersome has been mesenteric sites which have caused chronic abdominal pain, nausea/vomiting. She presents at this time due to inadequately controlled abdominal pain and nausea, which have made it difficult for her to take in food or water. Please see Dr. Brynda Rim note from 1/14 addended 1/15 with his very thoughtful assessment of this situation and current hospitalization.  - Holding home nirogacestat per Dr. [name] while feeling so poorly    Chronic Problems  PTSD  GAD  MDD  - Continue home lexapro  - Continue home ativan PRN     GERD  - Continue protonix, famotidine     Allergies  - Continue cetirizine     Checklist:  Diet: Regular Diet  DVT PPx: Ambulation  Code Status: Full Code  Dispo: anticipate discharge home vs AIR (based on PT/OT recs)    I personally spent 65 minutes face-to-face and non-face-to-face in the care of this patient, which includes all pre, intra, and post visit time on the date of service.  All documented time was specific to the E/M visit and does not include any procedures that may have been performed.  ___________________________________________________________________    Subjective:  No acute events overnight. In the past 24 hours the patient has received 80 mg of oxycodone and 4 mg IV dilaudid. She continues to have pain and nausea.    Labs/Studies:  Labs and Studies from the last 24hrs per EMR and Reviewed    Objective:  Temp:  [36.4 ??C (97.5 ??F)-36.8 ??C (98.2 ??F)] 36.6 ??C (97.9 ??F)  Heart Rate:  [78-104] 78  Resp:  [16-18] 18  BP: (98-116)/(57-73) 98/58  SpO2:  [96 %-100 %] 100 %    Gen: Sitting up in bed, non-toxic-appearing, mom at bedside  Eyes: appropriate eye contact, conjunctivae clear  HENT: MMM, normal appearing nose, corpak in left nare  Pulm: Speaking in full sentences, nl WOB on RA, clear to auscultation bilaterally  Abd: Soft, nondistended, diffusely tender  Ext: WWP, no edema or cyanosis  Neuro: A&O x 3, no focal deficits, conversant  Psych: Anxious, intermittently tearful, exhibits logical thinking and appropriate communication

## 2023-03-31 NOTE — Unmapped (Addendum)
Division of Infectious Diseases  General Inpatient Consultation Service       For questions about this consult, page [phone] (Gen A Follow-up Pager).      [name] [name] is being seen in consultation at the request of [name] Ahmadi, MD for evaluation and management of strep mitis bacteremia.       PLAN FOR 03/31/2023    Per [facility] antibiogram, around 20% of strep mitis isolates are resistant to ceftriaxone and 60% are resistant to penicillin G.  100% of isolates are vancomycin susceptible.  We will have micro lab add on susceptibilities to this isolate.  Timing of the blood cultures with lab collect is consistent with contamination as all consistent with the same time of lab draw which increases risk of contaminate. 1/15 BC neg prior to any antibiotic. No leukocytosis or elevated lactate.    Diagnostic  Follow-up blood culture 1/15 neg prior to antibiotics,  1/17 2/2 neg  Follow-up strep mitis AST  Monitor for antimicrobial toxicity with the following:  CBC w/diff at least once per week  BMP at least once per week  clinical assessments for rashes or other skin changes  vancomycin trough levels (goal 10-15; at least weekly once stable)  New transminitis is unlikely due to vancomycin. But could give a single dose of dapto and dc vancomycin.    Treatment  Continue vancomycin (for strep mitis) dosing per pharmacy  Recommend extended vancomycin infusion and benadryl pre-medication for vancomycin infusion syndrome  Duration of therapy = likely 7 days for strep mitis bacteremia from first negative blood culture  start date =  1/16  end date = 1/22    I discussed the plans for today with primary team on 03/31/2023.    Our service will sign off.    I personally spent 45 mins face-to-face and non-face-to-face in the care of this patient on 03/31/2023, which includes all pre, intra, and post visit time on the date of service.  All documented time was specific to the E/M visit and does not include any procedures that may have been performed.    Care for a suspected or confirmed infection was provided by an ID specialist in this encounter. [phone])    [name] Claude, MD PhD    [facility] Division of Infectious Diseases               MDM and Problem-Specific Assessments  ( .00ID2DAY  /  .91YNWGNFAOZH  /  .IDSS  / .[name] )     23 y.o. female with PMHx of [name] ([name]) s/p proctocolectomy w/ ileoanal anastomosis, desmoid tumors (currently on nirogacestat, previously on sorafenib), previously on TPN, complex chronic pain, presented with poor PO intake found with strep mitis in 1 or 2 blood cultures. The blood culture that was positive also was done at the same time as all of her other admission labs, thus risk of contaminate it higher.  BC done 3 hours after admission negative.    March 31, 2023   Patient has: []  acute illness w/systemic sxs  [mod] [x]  illness posing risk to life or function  [high]   I reviewed:   (3+) []  primary team note []  consultant note(s) []  procedure/op note(s) [x]  micro result(s)    [x]  CBC results [x]  chemistry results [x]  radiology report(s) []  nursing note(s)   I independently visualized:   (any)   []  cxs/plates in lab []  plain film images []  CT images []  PET images    []   path slide(s) []  ECG tracing []  MRI images []  nuclear scan   I discussed: (any) []  micro and/or path w/lab personnel []  drug options and/or interactions w/ID pharmD    []  procedure/OR findings w/other MD(s) []  echo and/or imaging w/other MD(s)    [x]  mgm't w/attending(s) involved in case []  setting up home abx w/OPAT team   Mgm't requires: []  prescription drug(s)  [mod] [x]  intensive toxicity monitoring  [high]     Interval notes & result reports show: NAEON, AF, VSS    Patient reports: Patient was chatting with psychiatry when I went into the room. Discussed the blood culture and strong likelihood of the initial one being a contaminate. Would treat for 7d total and then dc.    My interpretation of the data I reviewed is: Patient with new transaminitis yesterday with worsening today.  Vancomycin unlikely the cause. No eosinophilia.       # Strep mitis bacteremia  - acute, poses threat to life or bodily function  [high]  Recommendations per blue box      # Pouchitis  - acute, undx'd new problem w/uncertain prognosis  [mod]  Recommendations per blue box    # Management of prescription antimicrobials needing intensive toxicity monitoring - acute, poses threat to life or bodily function  [high]  Beta-lactams (penicillins, cephalosporins, and carbapenems) can cause rashes, loose stools, nephrotoxicity, and/or myelosuppression.  Vancomycin can cause back pain, various dermatological effects, thrombocytopenia, neutropenia, and/or nephrotoxicity.   See recommendations in blue box above.      # Disposition  TBD             Antimicrobials & Other Medications  ( .00IDGANTT  /  .00IDGANTTLIST  )     Current  Vancomycin 1/16-    Previous  Cefdinir  Ceftriaxone 1/17    Immunomodulators and antipyretics  None       Current Medications as of 03/31/2023  Scheduled  PRN   acetaminophen, 1,000 mg, Q8H SCH  ammonium lactate, 1 Application, BID  buprenorphine, 1 patch, Weekly  cetirizine, 20 mg, BID  cholecalciferol (vitamin D3 25 mcg (1,000 units)), 25 mcg, Daily  diphenhydrAMINE, 50 mg, Q8H  escitalopram oxalate, 5 mg, Daily  famotidine, 20 mg, Nightly  fluticasone propionate, 1 spray, Weekly  lidocaine, 1 patch, Daily  mirtazapine, 7.5 mg, Nightly  multivitamins (ADULT), 1 tablet, Daily  nirogacestat, 150 mg, BID  ondansetron, 8 mg, Q8H  pantoprazole, 40 mg, Daily before breakfast  promethazine, 12.5 mg, Q6H  scopolamine, 1 patch, Q72H  sodium chloride, 10 mL, Q8H  thiamine mononitrate (vit B1), 200 mg, Daily  vancomycin, 750 mg, Q8H      diphenhydrAMINE, 50 mg, Daily PRN  hydrocortisone, , BID PRN  HYDROmorphone, 1 mg, Q4H PRN  IP okay to treat, , Continuous PRN  IP okay to treat, , Continuous PRN  IP okay to treat, , Continuous PRN  LORazepam, 1 mg, BID PRN  melatonin, 3 mg, Nightly PRN  naloxegol, 12.5 mg, Daily PRN  oxyCODONE, 20 mg, Q4H PRN           Physical Exam     Temp:  [36.4 ??C (97.5 ??F)-36.8 ??C (98.2 ??F)] 36.6 ??C (97.9 ??F)  Heart Rate:  [78-104] 78  Resp:  [16-18] 18  BP: (98-105)/(57-72) 98/58  MAP (mmHg):  [69-83] 69  SpO2:  [96 %-100 %] 100 %    Actual body weight: 51.1 kg (112 lb 9.6 oz)  Ideal body weight: 57 kg (125 lb 10.6 oz)  Const [x]  vital signs above      []  WDWN, NAD, non-toxic appearance  []        Eyes   []  Lids normal bilaterally, conjunctiva anicteric and noninjected OU  []  PERRL   []        ENMT     []  Normal appearance of external nose and ears       []  OP clear    []  MMM, no lesions on lips or gums, dentition good        []  Hearing normal   []        Neck    []  Neck of normal appearance and trachea midline        []  No thyromegaly, nodules, or tenderness   []        Lymph    []  No LAD in neck       []  No LAD in supraclavicular area       []  No LAD in axillae   []  No LAD in epitrochlear chains       []  No LAD in inguinal areas  []        CV    []  RRR, no m/r/g, S1/S2       []  No peripheral edema, WWP       []  Pedal pulses intact   []        Resp   [x]  Normal WOB       []  CTAB   []        GI   []  Normal inspection, NTND, NABS       []  No umbilical hernia on exam       []  No hepatosplenomegaly       []  Inspection of perineal and perianal areas normal  []  Mild ttp diffusely with no rebound or guarding      GU   []  Normal external genitalia       []        MSK   []  No clubbing or cyanosis of hands       []  No focal tenderness or abnormalities on palpation of joints in RUE, LUE, RLE, or LLE  []        Skin   [x]  No rashes, lesions, or ulcers of visualized skin       []  Skin warm and dry to palpation   []        Neuro   [x]  CNs II-XII grossly intact       []  Sensation to light touch grossly intact throughout   []  DTRs normal and symmetric throughout   []  Unable to assess due to critical illness, sedation, or mental status  []        Psych   [x]  Appropriate affect      [x]  Oriented to person, place, time      []  Judgment and insight are appropriate   []  Unable to assess due to critical illness, sedation, or mental status  []           Patient Lines/Drains/Airways Status       Active Active Lines, Drains, & Airways       Name Placement date Placement time Site Days    NG/OG Tube Feedings Left nostril 03/29/23  1200  Left nostril  2    PICC Single Lumen 03/29/23 Left Brachial 03/29/23  1347  Brachial  2    Peripheral IV 03/28/23 Right;Lower Forearm 03/28/23  2042  Forearm  2  Data for ID Decision Making  ( IDGENCONMDM )       Micro & Serological Data   ( RSLTMICRO  /  10XNATF57  /  00CXSRC  /  00CXRES  /  00CXSUSC )    Microbiology Results (last day)       Procedure Component Value Date/Time Date/Time    Blood Culture [[id number]]  (Normal) Collected: 03/25/23 2147    Lab Status: Preliminary result Specimen: Blood from 1 Peripheral Draw Updated: 03/29/23 2230     Blood Culture, Routine No Growth at 4 days    Blood Culture [[id number]]  (Abnormal) Collected: 03/25/23 1717    Lab Status: Preliminary result Specimen: Blood from 1 Peripheral Draw Updated: 03/29/23 1158     Blood Culture, Routine Streptococcus mitis group     Comment: Member of the Streptococcus viridans Group  Susceptibility Results to follow        Gram Stain Result Gram positive cocci    Blood Culture #1 [[id number]]  (Normal) Collected: 03/27/23 0945    Lab Status: Preliminary result Specimen: Blood from 1 Peripheral Draw Updated: 03/29/23 1145     Blood Culture, Routine No Growth at 48 hours    Blood Culture #2 [[id number]]  (Normal) Collected: 03/27/23 0945    Lab Status: Preliminary result Specimen: Blood from 1 Peripheral Draw Updated: 03/29/23 1145     Blood Culture, Routine No Growth at 48 hours                1/15 Bcx 1/2 strep mitis  1/17 Bcx NGTD    Recent Studies  ( RISRSLT )    ECG 12 Lead  Result Date: 03/31/2023  NORMAL SINUS RHYTHM NONSPECIFIC T WAVE ABNORMALITY ABNORMAL ECG WHEN COMPARED WITH ECG OF 28-Mar-2023 12:13, NO SIGNIFICANT CHANGE WAS FOUND    XR Abdomen Portable  Result Date: 03/29/2023  EXAM: XR ABDOMEN PORTABLE ACCESSION: [id number] UN REPORT DATE: 03/29/2023 5:33 PM     CLINICAL INDICATION: 23 years old with NGT ([employer] ADJ)      COMPARISON: 03/14/2023     TECHNIQUE: Upright view of the abdomen, 1 image(s)     FINDINGS: Weighted enteric tube tip projects over the second portion of the duodenum. No pneumoperitoneum. Gaseous distention of multiple loops of bowel within the partially imaged upper abdomen in a nonspecific pattern. No acute osseous abnormality. Lung bases are clear.         Weighted enteric tube tip projects over the second portion of the duodenum.              Subjective:    Initial Consult Documentation from March 27, 2023     Sources of information include: chart review, patient, and family/friend(s).    HPI    23 y.o. 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    Patient underwent total abdominal colectomy with J pouch in 2022. Patient was previously admitted from 03/08/23-03/20/23 with refractory pain in setting of large tumor burden and diarrhea 2/2 pouchitis. She had increased bowel frequency up to 15-20 times daily with associated day time incontinence, urgency and hematochezia. She was started on cefdinir with improvement of symptoms. Pouchoscopy (03/13/23) revealed intact pouch with minimal inflammation and several superficial ulcerations at pouch anastamosis and pre-pouch inlet, suggestive of healing pouchitis. Biopsies with moderately active chronic enteritis, no CMV,  granuloma, or dysplasia. She was then discharged on 1/10 on cefdinir 300mg  BID (1/1-1/28) and then transitioned 300mg  daily. She was scheduled for a GI follow-up on 1/22. Patient then presented to [facility] ED on 1/15 due to patient's inability to tolerate PO and thinks may need TPN.     Upon arrival to [facility] ED, patient was afebrile with WBC to 11.4. She noted since discharge, she has experienced bowel movement frequency of 7-10 daily, the last noted watery without food particles with bright red blood along with severe pain. Blood cultures on 1/15 grew 1/2 strep mitis. She was started on ceftriaxone and vancomycin on 1/16. Patient developed vancomycin infusion syndrome, rashes and redness on her face, neck, and chest area.    Past Medical History   Patient  has a past medical history of Abdominal pain, Acid reflux, Anesthesia complication, Cancer (CMS-HCC), Cataract of right eye, COVID-19 virus infection (01/2019), Cyst of thyroid determined by ultrasound, [name], Difficult intravenous access, [name] (familial adenomatous polyposis), [name], Gastric polyps, History of chemotherapy, History of colon polyps, History of COVID-19 (01/2019), Ileus (CMS-HCC) (03/16/2022), Iron deficiency anemia due to chronic blood loss, PONV (postoperative nausea and vomiting), Rectal bleeding, and Syncopal episodes.      Meds and Allergies  Patient has a current medication list which includes the following prescription(s): acetaminophen, buprenorphine, cefdinir, couriered med or supply, escitalopram oxalate, famotidine, fluticasone propionate, hydrocortisone, lidocaine, lorazepam, naloxegol, naloxone, nirogacestat, ondansetron, pantoprazole, promethazine, scopolamine, sucralfate, and zinc oxide-cod liver oil, and the following Facility-Administered Medications: acetaminophen, ammonium lactate, buprenorphine, cetirizine, cholecalciferol (vitamin d3 25 mcg (1,000 units)), diphenhydramine, [DISCONTINUED] vancomycin in dextrose 5 % **AND** [CANCELED] Inpatient consult to Pharmacy RX to dose: vancomycin **AND** [DISCONTINUED] diphenhydramine **AND** diphenhydramine, escitalopram oxalate, famotidine, fluticasone propionate, hydrocortisone, hydromorphone (pf), IP OKAY TO TREAT, IP OKAY TO TREAT, IP OKAY TO TREAT, lidocaine, lorazepam, melatonin, mirtazapine, multivitamin with folic acid, naloxegol, nirogacestat, ondansetron, oxycodone, pantoprazole, promethazine (PHENERGAN) 12.5 mg in sodium chloride (NS) 0.9 % 50 mL IVPB, scopolamine, sodium chloride, thiamine mononitrate (vit b1), and vancomycin in dextrose 5 % **AND** Inpatient consult to Pharmacy RX to dose: vancomycin.    Allergies: Adhesive tape-silicones; Ferrlecit [sodium ferric gluconat-sucrose]; Levofloxacin; Methylnaltrexone; Neomycin; Papaya; Morphine; Zosyn [piperacillin-tazobactam]; Compazine [prochlorperazine]; Iron analogues; Reglan [metoclopramide hcl]; Iron dextran; and Latex, natural rubber    Social History  Patient  reports that she has never smoked. She has been exposed to tobacco smoke. She has never used smokeless tobacco. She reports that she does not drink alcohol and does not use drugs.    Scribe's Attest:  Francena Hanly, MD obtained and performed the history, physical exam and medical decision making elements that were entered into the chart. Documentation assistance was provided by me personally. Signed by Lanelle Bal, Scribe, on March 30, 2023 at 3:31 PM.     Provider???s Attestation:   Documentation assistance provided by the Scribe, Lanelle Bal. I was present during the time the encounter was Francena Hanly, MD. March 30, 2023 at 3:31 PM

## 2023-03-31 NOTE — Unmapped (Signed)
Luminal Gastroenterology Consult Service   Progress Note         Assessment and Recommendations:   [name] [name] is a 23 y.o. 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 The patient is seen in consultation at the request of [name], MD ([facility])) for  multiple GI complaints .    Acute on chronic abdominal pain - Poor oral intake  Patient has had longstanding concerns over nutritional status, has previously required TPN and enteral feeding, without clear evidence of weight loss despite compliance with prescribed regimens. Patient reports intolerance to all oral intake, including oral medications because of severity of pain and nausea. Patient has previously had abdominal imaging without acute obstruction, no history of short bowel syndrome and no other prior structural abnormalities on EGD that would physiologically prevent patient from eating by mouth. Although patient has objectively lost weight (30lbs since ~summer, unclear if related to fluid overload), no evidence of low albumin, electrolyte derangements/AKI. Priority goal during early portion of hospitalization was to hopefully break the cycle of acuity of symptoms, although this has largely been unsuccessful. We are now focusing on more carefully addressing nutritional +psychosocial distress surrounding chronic illness needs.  Hospitalization overall has been very challenging for both patient and providers as patient continues to exhibit lack of progress in her symptoms and continued request for IV medications despite no physiologic barriers to absorption.  Today, there has been somewhat breakdown in productive communication given patient's anxiety towards illness.    - No acute indication for initiation of TPN, GI has discussed with patient that this request has been declined for multiple reasons not limited to potential acute blood stream infection.  - s/p post-pyloric placement of Corpak. Not a candidate for PEG tube placement due to desmoid tumors  - Pending symptom trajectory, may consider inpatient EGD  - Agree with Nutrition and Pain Management consults, support the decision to offer oral medications first-line and avoiding IV anticholinergics, IV opioids as able.  - Continue scheduled IV Zofran and IV promethazine for breakthrough nausea.  -Agree with methods of psychosocial support while inpatient.    [name] s/p total colectomy + ileal pouch   She was recently hospitalized on the inpatient solid oncology service with hematochezia and c/f for recurrent pouchitis from 03/08/23 to 03/20/23. Pouchoscopy on 1/3 showed intact pouch with minimal inflammation. Low suspicion for active pouchitis or antimicrobial therapy failure contributing to current symptomatology. In addition, do not think that there is an ongoing GI source of bacteremia contributing. Will need to monitor clinical trajectory inpatient, low suspicion for clinically significant GIB given hemoglobin is within normal limits. Patient may have some hemorrhoidal bleeding contributing but low suspicion for inflammatory disorder of the pouch.     - Continue GIB precautions while inpatient with appropriate access, at least daily CBC, active T&S  - Asked patient to maintain stool diary with photodocumentation of hematochezia  - No acute indication for repeat pouchoscopy, consider EGD this admission for follow up of [name]  - Appreciate ID recommendations for risk/benefit of antimicrobial therapy and patient reported side effects towards IV antibiotics      Issues Impacting Complexity of Management:  -Discussed this patient's care with Dr. Britt Bolognese from the Gastroenterology service as summarized in this  note    Recommendations discussed with the patient's primary team. We will continue to follow along with you.    History of Present Illness:     Interval events: Patient reports significant continued symptoms, almost verbatim since day of admission.  She reports significant nausea and abdominal pain at the site of her tumors with any oral intake.  She requests need to slowly uptitrate enteral feeds and continue utilizing IV medications.  Had a long extensive discussion with hospitalist primary physician about the need to transition slowly to utilizing oral medications given no physiologic barriers to absorption with IV options as a second line.  She continues to anticipate further complications as we tried to advance patient's care.  She briefly spoke about the need to be in inpatient rehab due to her physical decline.   Conversation was fairly unproductive in reasoning with patient as she felt extremely misunderstood by physicians and that we were not believing her symptoms to be true.  She continues to endorse poor quality of life and extreme frustration with her symptoms.    -I have reviewed the patient's prior records from [facility] Medicine, [facility], [facility] Gastroenterology as summarized in the HPI    Objective:   Temp:  [36.5 ??C (97.7 ??F)-36.8 ??C (98.2 ??F)] 36.8 ??C (98.2 ??F)  Heart Rate:  [85-111] 85  Resp:  [16-18] 16  BP: (101-126)/(64-74) 102/64  SpO2:  [96 %-99 %] 96 %    Gen: WDWN female, anxious appearing, answers questions appropriately  Abdomen: Soft, diffuse tenderness to palpation, non-distended, no rebound/guarding, no hepatosplenomegaly. Surgical scars present and intact.   Extremities: No edema in the BLEs    Wt Readings from Last 12 Encounters:   03/27/23 51.1 kg (112 lb 9.6 oz)   03/24/23 52.8 kg (116 lb 6.4 oz)   03/17/23 53.5 kg (118 lb)   02/24/23 54.1 kg (119 lb 4.8 oz)   01/23/23 57.4 kg (126 lb 9.6 oz)   01/21/23 58 kg (127 lb 12.8 oz)   01/14/23 58 kg (127 lb 12.8 oz) 12/16/22 58.5 kg (129 lb)   12/16/22 58.5 kg (129 lb)   12/08/22 61.3 kg (135 lb 3.2 oz)   11/14/22 61 kg (134 lb 8 oz)   09/24/22 62.4 kg (137 lb 9.6 oz)        Pertinent Labs/Studies:  -I have reviewed the patient's labs from 03/30/23 which show stable Hgb and stable renal function (SCr, electrolytes) and 1/2 blood culture bottles positive for strep mitis.      Pouchoscopy biopsy:  A: Small bowel, pouch, biopsy  - Erosive moderately active chronic enteritis  - No CMV viral cytopathic effect, granuloma, or dysplasia identified     Pouchoscopy 1/3:   Intact pouch with minimal inflammation and several superficial ulcerations at the pouch anastamosis and pre-pouch inlet, suggestive of healing pouchitis.     Other scopes:  Pouch exam 07/2022 - normal ileum, rectal cuff appeared normal with the exception of small rectal polpys, which were treated with APC   Pouch exam 03/2022 - friable mucosa a ileoanal pouch suture line, treated with APC   Pouch exam 02/2022 - anal fissure, 10mm circumferential ulcer oozing at the ileocolonic anastomosis, treated with APC   Pouch exam 01/2022 - anal fissure, ulcer at ileocolonic anastomosis   EGD 01/2022 - multple polyps in the stomach, normal esophagus, duodenum and jejunum   EGD 08/2021- fundic gland polyps in the stomach, otherwise normal  EGD 2022- fundic gland polyps in the stomach,  adenomatous tissue removed from small bowel on random biopsy to rule out celiac disease (none seen endoscopically)      01/23/2023 MRI A/P:  - Overall slightly increased size of the ill-defined enhancing soft tissue in the central lower mesentery extending into the pelvis/presacral region with associated small bowel tethering corresponding to desmoid tumor.   - Sequela of proctocolectomy and ileoanal anastomosis with grossly similar dilation of bowel proximal to the anastomosis.   -Three enhancing soft tissue lesions in the right rectus musculature, the largest of which is smaller in size compared to 11/11/2022.  - Slightly increased size of multiple prominent central mesenteric lymph nodes measuring up to 1.0 cm which may be reactive.  - Increased size of a nonenhancing 1.9 cm left interpolar parapelvic cyst with layering internal hemorrhagic debris.  - Marked narrowing of the left renal vein as it crosses between the SMA and aorta, correlate clinically for signs of nutcracker syndrome.  - Findings suggestive of iron deposition within the liver.  - Numerous cystic foci along the pelvic wall without definite enhancement as described in the body of the report, correlate with physical examination.

## 2023-03-31 NOTE — Unmapped (Shared)
Division of Infectious Diseases  General Inpatient Consultation Service     ***PRELIMINARY NOTE ONLY - DRAFTED BEFORE PATIENT ENCOUNTER. Follow up final note for recommendations & contact info.***    For questions about this consult, page [phone] (Gen A Follow-up Pager).      [name] [name] is being seen in consultation at the request of [name] Ahmadi, MD for evaluation and management of strep mitis bacteremia.       PLAN FOR 03/31/2023      It is hard to dismiss strep mitis as a contaminant here given compromised tissue planes with [name], desmoid tumors, and recent pouchitis.  Per [facility] antibiogram, around 20% of strep mitis isolates are resistant to ceftriaxone and 60% are resistant to penicillin G.  100% of isolates are vancomycin susceptible.  We will have micro lab add on susceptibilities to this isolate.      Diagnostic  Follow-up blood culture 1/17  Follow-up strep mitis AST  Monitor for antimicrobial toxicity with the following:  CBC w/diff at least once per week  BMP at least once per week  clinical assessments for rashes or other skin changes  vancomycin trough levels (goal 10-15; at least weekly once stable)    Treatment  Continue vancomycin (for strep mitis) dosing per pharmacy  Recommend extended vancomycin infusion and benadryl pre-medication for vancomycin infusion syndrome  Duration of therapy = likely 7 days for strep mitis bacteremia from first negative blood culture  start date =  1/17  end date = 1/23    I discussed the plans for today with primary team on 03/31/2023.    Our service will continue to follow.    I personally spent 45 mins face-to-face and non-face-to-face in the care of this patient on 03/31/2023, which includes all pre, intra, and post visit time on the date of service.  All documented time was specific to the E/M visit and does not include any procedures that may have been performed.    Care for a suspected or confirmed infection was provided by an ID specialist in this encounter. [phone])    [name] Coffee, MD  [facility] Division of Infectious Diseases               MDM and Problem-Specific Assessments  ( .00ID2DAY  /  .01SWFUXNATFT  /  .IDSS  / .[name] [name] )     23 y.o. 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 Unclear if this is a contaminant or not but strep mitis, but given compromised tissue planes, we recommended treating for one week. Patient had vancomycin infusion syndrome, so recommending slow infusion and benadryl.    March 31, 2023   Patient has: []  acute illness w/systemic sxs  [mod] [x]  illness posing risk to life or function  [high]   I reviewed:   (3+) [x]  primary team note [x]  consultant note(s) []  procedure/op note(s) [x]  micro result(s)    [x]  CBC results [x]  chemistry results []  radiology report(s) []  nursing note(s)   I independently visualized:   (any)   []  cxs/plates in lab []  plain film images []  CT images []  PET images    []  path slide(s) []  ECG tracing []  MRI images []  nuclear scan   I discussed: (any) []  micro and/or path w/lab personnel []  drug options and/or interactions w/ID pharmD    []   procedure/OR findings w/other MD(s) []  echo and/or imaging w/other MD(s)    [x]  mgm't w/attending(s) involved in case []  setting up home abx w/OPAT team   Mgm't requires: []  prescription drug(s)  [mod] [x]  intensive toxicity monitoring  [high]     Interval notes & result reports show: Afebrile. VSS on RA. No acute events overnight.     Patient reports: ***    My interpretation of the data I reviewed is: WBC 6.7, WNL. Cr 0.55. 1/17 Bcx NGTD. No new imaging.    # Strep mitis bacteremia  - acute, poses threat to life or bodily function  [high]  Recommendations per blue box      # Pouchitis  - acute, undx'd new problem w/uncertain prognosis [mod]  Recommendations per blue box    # Management of prescription antimicrobials needing intensive toxicity monitoring - acute, poses threat to life or bodily function  [high]  Beta-lactams (penicillins, cephalosporins, and carbapenems) can cause rashes, loose stools, nephrotoxicity, and/or myelosuppression.  Vancomycin can cause back pain, various dermatological effects, thrombocytopenia, neutropenia, and/or nephrotoxicity.   See recommendations in blue box above.      # Disposition  TBD             Antimicrobials & Other Medications  ( .00IDGANTT  /  .00IDGANTTLIST  )     Current  Vancomycin 1/16-    Previous  Cefdinir  Ceftriaxone 1/17    Immunomodulators and antipyretics  None       Current Medications as of 03/31/2023  Scheduled  PRN   acetaminophen, 1,000 mg, Q8H SCH  ammonium lactate, 1 Application, BID  buprenorphine, 1 patch, Weekly  cetirizine, 20 mg, BID  cholecalciferol (vitamin D3 25 mcg (1,000 units)), 25 mcg, Daily  diphenhydrAMINE, 50 mg, Q8H  escitalopram oxalate, 5 mg, Daily  famotidine, 20 mg, Nightly  fluticasone propionate, 1 spray, Weekly  lidocaine, 1 patch, Daily  mirtazapine, 7.5 mg, Nightly  multivitamins (ADULT), 1 tablet, Daily  nirogacestat, 150 mg, BID  ondansetron, 8 mg, Q8H  pantoprazole, 40 mg, Daily before breakfast  promethazine, 12.5 mg, Q6H  scopolamine, 1 patch, Q72H  sodium chloride, 10 mL, Q8H  thiamine mononitrate (vit B1), 200 mg, Daily  vancomycin, 750 mg, Q8H      diphenhydrAMINE, 50 mg, Daily PRN  hydrocortisone, , BID PRN  HYDROmorphone, 1 mg, Q4H PRN  IP okay to treat, , Continuous PRN  IP okay to treat, , Continuous PRN  IP okay to treat, , Continuous PRN  LORazepam, 1 mg, BID PRN  melatonin, 3 mg, Nightly PRN  naloxegol, 12.5 mg, Daily PRN  oxyCODONE, 20 mg, Q4H PRN           Physical Exam     Temp:  [36.4 ??C (97.5 ??F)-36.8 ??C (98.2 ??F)] 36.6 ??C (97.9 ??F)  Heart Rate:  [78-104] 78  Resp:  [16-18] 18  BP: (98-105)/(57-72) 98/58  MAP (mmHg):  [69-83] 69  SpO2:  [96 %-100 %] 100 %    Actual body weight: 51.1 kg (112 lb 9.6 oz)  Ideal body weight: 57 kg (125 lb 10.6 oz)      Const [x]  vital signs above      []  WDWN, NAD, non-toxic appearance  []        Eyes   []  Lids normal bilaterally, conjunctiva anicteric and noninjected OU  []  PERRL   []        ENMT     []  Normal appearance of external nose and ears       []   OP clear    []  MMM, no lesions on lips or gums, dentition good        []  Hearing normal   []        Neck    []  Neck of normal appearance and trachea midline        []  No thyromegaly, nodules, or tenderness   []        Lymph    []  No LAD in neck       []  No LAD in supraclavicular area       []  No LAD in axillae   []  No LAD in epitrochlear chains       []  No LAD in inguinal areas  []        CV    [x]  RRR, no m/r/g, S1/S2       []  No peripheral edema, WWP       [x]  Pedal pulses intact   []        Resp   [x]  Normal WOB       [x]  CTAB   []        GI   []  Normal inspection, NTND, NABS       []  No umbilical hernia on exam       []  No hepatosplenomegaly       []  Inspection of perineal and perianal areas normal  []  Mild ttp diffusely with no rebound or guarding      GU   []  Normal external genitalia       []        MSK   []  No clubbing or cyanosis of hands       []  No focal tenderness or abnormalities on palpation of joints in RUE, LUE, RLE, or LLE  []        Skin   [x]  No rashes, lesions, or ulcers of visualized skin       [x]  Skin warm and dry to palpation   []        Neuro   [x]  CNs II-XII grossly intact       []  Sensation to light touch grossly intact throughout   []  DTRs normal and symmetric throughout   []  Unable to assess due to critical illness, sedation, or mental status  []        Psych   [x]  Appropriate affect      [x]  Oriented to person, place, time      []  Judgment and insight are appropriate   []  Unable to assess due to critical illness, sedation, or mental status  []           Patient Lines/Drains/Airways Status       Active Active Lines, Drains, & Airways       Name Placement date Placement time Site Days    NG/OG Tube Feedings Left nostril 03/29/23  1200  Left nostril  1    PICC Single Lumen 03/29/23 Left Brachial 03/29/23  1347  Brachial  1    Peripheral IV 03/28/23 Right;Lower Forearm 03/28/23  2042  Forearm  2                      Data for ID Decision Making  ( IDGENCONMDM )       Micro & Serological Data   ( RSLTMICRO  /  [id number]  /  00CXSRC  /  00CXRES  /  00CXSUSC )  Microbiology Results (last day)       Procedure Component Value Date/Time Date/Time  Blood Culture #1 [[id number]]  (Normal) Collected: 03/27/23 0945    Lab Status: Preliminary result Specimen: Blood from 1 Peripheral Draw Updated: 03/31/23 1145     Blood Culture, Routine No Growth at 4 days    Blood Culture #2 [[id number]]  (Normal) Collected: 03/27/23 0945    Lab Status: Preliminary result Specimen: Blood from 1 Peripheral Draw Updated: 03/31/23 1145     Blood Culture, Routine No Growth at 4 days    Blood Culture [[id number]]  (Normal) Collected: 03/25/23 2147    Lab Status: Final result Specimen: Blood from 1 Peripheral Draw Updated: 03/30/23 2230     Blood Culture, Routine No Growth at 5 days          1/15 Bcx 1/2 strep mitis  1/17 Bcx NGTD    Recent Studies  ( RISRSLT )    ECG 12 Lead  Result Date: 03/31/2023  NORMAL SINUS RHYTHM NONSPECIFIC T WAVE ABNORMALITY ABNORMAL ECG WHEN COMPARED WITH ECG OF 28-Mar-2023 12:13, NO SIGNIFICANT CHANGE WAS FOUND    XR Abdomen Portable  Result Date: 03/29/2023  EXAM: XR ABDOMEN PORTABLE ACCESSION: [id number] UN REPORT DATE: 03/29/2023 5:33 PM     CLINICAL INDICATION: 23 years old with NGT ([employer] ADJ)      COMPARISON: 03/14/2023     TECHNIQUE: Upright view of the abdomen, 1 image(s)     FINDINGS: Weighted enteric tube tip projects over the second portion of the duodenum. No pneumoperitoneum. Gaseous distention of multiple loops of bowel within the partially imaged upper abdomen in a nonspecific pattern. No acute osseous abnormality. Lung bases are clear. Weighted enteric tube tip projects over the second portion of the duodenum.              Subjective:    Initial Consult Documentation from March 27, 2023     Sources of information include: chart review, patient, and family/friend(s).    HPI    23 y.o. 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    Patient underwent total abdominal colectomy with J pouch in 2022. Patient was previously admitted from 03/08/23-03/20/23 with refractory pain in setting of large tumor burden and diarrhea 2/2 pouchitis. She had increased bowel frequency up to 15-20 times daily with associated day time incontinence, urgency and hematochezia. She was started on cefdinir with improvement of symptoms. Pouchoscopy (03/13/23) revealed intact pouch with minimal inflammation and several superficial ulcerations at pouch anastamosis and pre-pouch inlet, suggestive of healing pouchitis. Biopsies with moderately active chronic enteritis, no CMV, granuloma, or dysplasia. She was then discharged on 1/10 on cefdinir 300mg  BID (1/1-1/28) and then transitioned 300mg  daily. She was scheduled for a GI follow-up on 1/22. Patient then presented to [facility] ED on 1/15 due to patient's inability to tolerate PO and thinks may need TPN.     Upon arrival to [facility] ED, patient was afebrile with WBC to 11.4. She noted since discharge, she has experienced bowel movement frequency of 7-10 daily, the last noted watery without food particles with bright red blood along with severe pain. Blood cultures on 1/15 grew 1/2 strep mitis. She was started on ceftriaxone and vancomycin on 1/16. Patient developed vancomycin infusion syndrome, rashes and redness on her face, neck, and chest area.    Past Medical History   Patient  has a past medical history of Abdominal  pain, Acid reflux, Anesthesia complication, Cancer (CMS-HCC), Cataract of right eye, COVID-19 virus infection (01/2019), Cyst of thyroid determined by ultrasound, [name], Difficult intravenous access, [name] (familial adenomatous polyposis), [name], Gastric polyps, History of chemotherapy, History of colon polyps, History of COVID-19 (01/2019), Ileus (CMS-HCC) (03/16/2022), Iron deficiency anemia due to chronic blood loss, PONV (postoperative nausea and vomiting), Rectal bleeding, and Syncopal episodes.      Meds and Allergies  Patient has a current medication list which includes the following prescription(s): acetaminophen, buprenorphine, cefdinir, couriered med or supply, escitalopram oxalate, famotidine, fluticasone propionate, hydrocortisone, lidocaine, lorazepam, naloxegol, naloxone, nirogacestat, ondansetron, pantoprazole, promethazine, scopolamine, sucralfate, and zinc oxide-cod liver oil, and the following Facility-Administered Medications: acetaminophen, ammonium lactate, buprenorphine, cetirizine, cholecalciferol (vitamin d3 25 mcg (1,000 units)), diphenhydramine, [DISCONTINUED] vancomycin in dextrose 5 % **AND** [CANCELED] Inpatient consult to Pharmacy RX to dose: vancomycin **AND** [DISCONTINUED] diphenhydramine **AND** diphenhydramine, escitalopram oxalate, famotidine, fluticasone propionate, hydrocortisone, hydromorphone (pf), IP OKAY TO TREAT, IP OKAY TO TREAT, IP OKAY TO TREAT, lidocaine, lorazepam, melatonin, mirtazapine, multivitamin with folic acid, naloxegol, nirogacestat, ondansetron, oxycodone, pantoprazole, promethazine (PHENERGAN) 12.5 mg in sodium chloride (NS) 0.9 % 50 mL IVPB, scopolamine, sodium chloride, thiamine mononitrate (vit b1), and vancomycin in dextrose 5 % **AND** Inpatient consult to Pharmacy RX to dose: vancomycin.    Allergies: Adhesive tape-silicones; Ferrlecit [sodium ferric gluconat-sucrose]; Levofloxacin; Methylnaltrexone; Neomycin; Papaya; Morphine; Zosyn [piperacillin-tazobactam]; Compazine [prochlorperazine]; Iron analogues; Reglan [metoclopramide hcl]; Iron dextran; and Latex, natural rubber    Social History  Patient  reports that she has never smoked. She has been exposed to tobacco smoke. She has never used smokeless tobacco. She reports that she does not drink alcohol and does not use drugs.    Scribe's Attest:  Francena Hanly, MD obtained and performed the history, physical exam and medical decision making elements that were entered into the chart. Documentation assistance was provided by me personally. Signed by Paulina Fusi, Scribe, on March 31, 2023 at 11:45 AM.     Provider???s Attestation:   Documentation assistance provided by the Scribe, Paulina Fusi. I was present during the time the encounter was Francena Hanly, MD. March 31, 2023 at 11:45 AM

## 2023-03-31 NOTE — Unmapped (Signed)
[facility]  Initial Psychiatry Consult Note      Date of admission: 03/25/2023  6:00 PM  Service Date: March 31, 2023  Primary Team: [facility])  LOS:  LOS: 4 days      Assessment:   [name] [name] is a 23 y.o. 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 Marsa Aris for ongoing evaluation and management of anxiety and depression.      [name] presents with symptoms consistent with a diagnosis of GAD and MDD, consistent with her historical diagnoses. Symptoms have worsened in the context of prolonged medical hospitalization, with increased anxiety/panic attacks. Will continue to follow for psychosocial support and management of psychiatric symptoms. Plan of care outlined below:    Diagnoses:   Active Hospital problems:  Principal Problem:    Cancer associated pain  Active Problems:    [name]    Pain medication agreement signed    Pouchitis (CMS-HCC)       Problems edited/added by me:  No problems updated.    Risk Assessment:  ASQ screening result: not completed    -A suicide and violence risk assessment was performed as part of this evaluation. Risk factors for self-harm/suicide: sense of isolation, current diagnosis of depression, and chronic severe medical condition.  Protective factors against self-harm/suicide:  lack of active SI, restricted access to highly lethal means of suicide, no history of previous suicide attempts , motivation for treatment, currently receiving mental health treatment, and supportive family.  Risk factors for harm to others: N/A. Protective factors against harm to others: no known history of violence towards others, no active symptoms of psychosis, high intellectual functioning, and connectedness to family.     Current suicide risk: low risk  Current homicide risk: low risk      Recommendations:     Safety and Observation Level:   -- This patient is not currently under IVC. If safety concerns arise, please page psychiatry for an evaluation.    Medications:  -- Continue home mirtazapine 7.5mg  nightly (ok to discontinue if patient prefers)  -- Continue home escitalopram 10mg  daily (currently at 5mg  daily, may increase as tolerated)  -- Continue lorazepam 1mg  BID PRN 1st line panic attacks/severe anxiety    Further Work-up:   -- No further recommendations at this time from a psychiatric standpoint    Behavioral / Environmental:   -- Patient would benefit from consultation with [facility] Psychology. Please place a consult to inpatient psychiatry and enter [facility] - ADULT [facility] PSYCHOLOGY under 'which provider care team'. We will coordinate the consult with their team.  -- Utilize compassion and acknowledge the patient's experiences while setting clear and realistic expectations for care.     Follow-up:  -- When patient is discharged, please ensure that their AVS includes information about the 45 Suicide & Crisis Lifeline.  -- The patient currently receives mental health care with [facility] and we will attempt to coordinate followup with our psychiatry team on discharge  -- We will follow as needed at this time.     Thank you for this consult request. Recommendations have been communicated to the primary team. Please page [phone]  for any questions or concerns.  Discussed with and seen by the attending, Dr. Delana Meyer, who agrees with the assessment and plan.    Lu Duffel, MD PGY3      I saw and evaluated the patient on the date of service. I reviewed and edited the note where appropriate.     [name], MD        Subjective   Patient Interview:  Patient stated  having a rough time and not the greatest hospital stay. Patient says she misses her therapist who she had 4 to 5 years. Patient states she feels trapped in her body, due to her medical condition and can never catch a break and has made quality of life, low. Patient states that she has anxiety being here and trauma from being admitted, due to last hospital stay raising concern of safety. States that recent Ativan dose increase was due to panic attacks when out in public and states she takes medication when absolutely necessary. Traumatic event that occurred after 3 month hospitalization, had to force to eat due to unclear instructions on corpak given. Panic attacks usually stem from safety, and states she shuts downs, feels tingly and starts hyperventilating along with wanting to isolate herself from people.     ROS:   All systems reviewed as negative/unremarkable aside from the following pertinent positives and negatives: none noted    Collateral:   - Reviewed medical records in Epic    Psychiatric History:   Prior psychiatric diagnoses: GAD and MDD  Psychiatric hospitalizations: Patient denies   Suicide attempts / Non-suicidal self-injury: Patient denies   Medication trials: Atarax 50 mg BID PRN, Remeron 22.5 mg, Ativan 0.5 mg TID PRN, Cymbalta, Zyprexa, Seroquel, Lexapro (stopped due to bleeding)   Current psychiatrist: [facility] Maryagnes Amos)  Current therapist: Previously with [name], LCSW - [facility] Clinical Social Worker. Will be transitioning to Arlana Pouch, PsyD.  Other treatments: Ketamine     Family Psychiatric History: Denies family history of depressive disorders and bipolar and related disorders     Substance Use History:  Tobacco use: Pt denies smoking or use of smokeless tobacco.   Alcohol use:  Reports no history of alcohol use.   Other substance use: Reports no history of drug use.   Substance use disorder treatment: Denies  UDS results: n/a  BAL on admission: n/a     Social History:   Patient lives with parent in a private residence.   Highest level of education: Attended [employer], [place] for nursing, has been interrupted by health issues  Important relationships: mother  Employment status: Unemployed   Legal history: n/a  Hotel manager history: n/a  Firearms: Yes, Secured    Current Medications:  Scheduled Meds:   acetaminophen  1,000 mg Enteral tube: post-pyloric (duodenum, jejunum) Q8H SCH    ammonium lactate  1 Application Topical BID    buprenorphine  1 patch Transdermal Weekly    cetirizine  20 mg Enteral tube: post-pyloric (duodenum, jejunum) BID    cholecalciferol (vitamin D3 25 mcg (1,000 units))  25 mcg Oral Daily    diphenhydrAMINE  50 mg Enteral tube: post-pyloric (duodenum, jejunum) Q8H    escitalopram oxalate  5 mg Enteral tube: post-pyloric (duodenum, jejunum) Daily    famotidine  20 mg Enteral tube: post-pyloric (duodenum, jejunum) Nightly    fluticasone propionate  1 spray Topical Weekly    lidocaine  1 patch Transdermal Daily    mirtazapine  7.5 mg Oral Nightly    multivitamins (ADULT)  1  tablet Oral Daily    nirogacestat  150 mg Oral BID    ondansetron  8 mg Intravenous Q8H    pantoprazole  40 mg Oral Daily before breakfast    promethazine  12.5 mg Intravenous Q6H    scopolamine  1 patch Topical Q72H    sodium chloride  10 mL Intravenous Q8H    thiamine mononitrate (vit B1)  200 mg Oral Daily    vancomycin  750 mg Intravenous Q8H     Continuous Infusions:   IP okay to treat      IP okay to treat      IP okay to treat       PRN Meds:.diphenhydrAMINE, hydrocortisone, HYDROmorphone, IP okay to treat, IP okay to treat, IP okay to treat, LORazepam, melatonin, naloxegol, oxyCODONE      Objective:   Vital signs:   Temp:  [36.4 ??C (97.5 ??F)-36.7 ??C (98.1 ??F)] 36.7 ??C (98.1 ??F)  Heart Rate:  [78-104] 87  Resp:  [18] 18  BP: (98-110)/(57-72) 110/71  MAP (mmHg):  [69-84] 84  SpO2:  [96 %-100 %] 98 %    Physical Exam:  Gen: No acute distress.    Mental Status Exam:  Appearance:  appears stated age and in bed -- NG tube in place, IV lines present.    Attitude: calm, cooperative   Behavior/Psychomotor:  appropriate eye contact and no abnormal movements   Speech/Language:   normal rate, not pressured, normal volume, normal fluency. normal articulation   Mood:  Describes as slightly hopeful   Affect:  mood congruent and euthymic   Thought process:  logical, linear, clear, coherent, goal directed   Thought content:   Reports no SI, revolves around navigating interpersonal and developmental challenges under medical stress. Appropriate.    Perceptual disturbances:  Patient does not appear to be responding to internal stimuli     Attention:  able to attend to interview without fluctuations in consciousness   Concentration:  Able to fully concentrate and attend   Orientation:  grossly oriented.   Memory:  not formally tested, but grossly intact   Fund of knowledge:   not formally assessed   Insight:    Intact   Judgment:   Intact   Impulse Control:  Intact     Additional Psychometric Testing:  Not applicable.    Consult Type and Time-Based Documentation:  This patient was evaluated in person.    Time-based billing disclaimer:  I personally spent 55   minutes face-to-face and non-face-to-face in the care of this patient, which includes all pre, intra, and post visit time on the date of service.  All documented time was specific to the E/M visit and does not include any procedures that may have been performed.

## 2023-03-31 NOTE — Unmapped (Addendum)
Vancomycin Therapeutic Monitoring Pharmacy Note    [name] [name] is a 23 y.o. female continuing vancomycin. Date of therapy initiation: 03/27/2023-03/28/2023, r/s on 03/29/2023     Indication: Bacteremia/Sepsis    Prior Dosing Information: Current regimen Vancomycin 750 mg IV q8 hours      Goals:  Therapeutic Drug Levels  Vancomycin trough goal: 10-15 mg/L    Additional Clinical Monitoring/Outcomes  Renal function, volume status (intake and output)    Results: Vancomycin level: 15.3 mg/L, drawn ~1 hour early (true trough 13.5 mg/L)    Wt Readings from Last 1 Encounters:   03/27/23 51.1 kg (112 lb 9.6 oz)     Creatinine   Date Value Ref Range Status   03/31/2023 0.55 0.55 - 1.02 mg/dL Final   16/12/9602 5.40 (L) 0.55 - 1.02 mg/dL Final   98/01/9146 8.29 (L) 0.55 - 1.02 mg/dL Final        Pharmacokinetic Considerations and Significant Drug Interactions:  Adult (calculated on 03/31/23): Vd = 41.8 L, ke = 0.109 hr-1  Concurrent nephrotoxic meds: not applicable    Assessment/Plan:  Recommendation(s)  Continue current regimen of vancomycin 750 mg IV q8 hours as end date anticipated 1/23. Would recommend changing to q12 hour regimen if continued past 1/23 as patient appears to be accumulating on q8h regimen  Estimated trough on recommended regimen:  14 mg/L    Follow-up  Level due: in 3-5 days  A pharmacist will continue to monitor and order levels as appropriate    Please page service pharmacist with questions/clarifications.    Dagmar Hait, PharmD

## 2023-04-01 DIAGNOSIS — D48119 Desmoid tumor: Principal | ICD-10-CM

## 2023-04-01 LAB — MAGNESIUM: MAGNESIUM: 1.9 mg/dL (ref 1.6–2.6)

## 2023-04-01 LAB — PROTIME-INR
INR: 1.1
PROTIME: 12.5 s (ref 9.9–12.6)

## 2023-04-01 LAB — COMPREHENSIVE METABOLIC PANEL
ALBUMIN: 3.5 g/dL (ref 3.4–5.0)
ALKALINE PHOSPHATASE: 209 U/L — ABNORMAL HIGH (ref 46–116)
ALT (SGPT): 471 U/L — ABNORMAL HIGH (ref 10–49)
ANION GAP: 8 mmol/L (ref 5–14)
AST (SGOT): 332 U/L — ABNORMAL HIGH (ref <=34)
BILIRUBIN TOTAL: 0.4 mg/dL (ref 0.3–1.2)
BLOOD UREA NITROGEN: 7 mg/dL — ABNORMAL LOW (ref 9–23)
BUN / CREAT RATIO: 13
CALCIUM: 9.6 mg/dL (ref 8.7–10.4)
CHLORIDE: 104 mmol/L (ref 98–107)
CO2: 31 mmol/L (ref 20.0–31.0)
CREATININE: 0.53 mg/dL — ABNORMAL LOW (ref 0.55–1.02)
EGFR CKD-EPI (2021) FEMALE: >90 mL/min/{1.73_m2} (ref >=60)
GLUCOSE RANDOM: 89 mg/dL (ref 70–179)
POTASSIUM: 4.2 mmol/L (ref 3.4–4.8)
PROTEIN TOTAL: 6.2 g/dL (ref 5.7–8.2)
SODIUM: 143 mmol/L (ref 135–145)

## 2023-04-01 LAB — CBC
HEMATOCRIT: 34.3 % (ref 34.0–44.0)
HEMOGLOBIN: 11.6 g/dL (ref 11.3–14.9)
MEAN CORPUSCULAR HEMOGLOBIN CONC: 33.7 g/dL (ref 32.0–36.0)
MEAN CORPUSCULAR HEMOGLOBIN: 27.3 pg (ref 25.9–32.4)
MEAN CORPUSCULAR VOLUME: 81.2 fL (ref 77.6–95.7)
MEAN PLATELET VOLUME: 10 fL (ref 6.8–10.7)
PLATELET COUNT: 187 10*9/L (ref 150–450)
RED BLOOD CELL COUNT: 4.23 10*12/L (ref 3.95–5.13)
RED CELL DISTRIBUTION WIDTH: 16.4 % — ABNORMAL HIGH (ref 12.2–15.2)
WBC ADJUSTED: 6.6 10*9/L (ref 3.6–11.2)

## 2023-04-01 LAB — CK: CREATINE KINASE TOTAL: 22 U/L — ABNORMAL LOW (ref 34.0–145.0)

## 2023-04-01 LAB — PHOSPHORUS: PHOSPHORUS: 5.8 mg/dL — ABNORMAL HIGH (ref 2.4–5.1)

## 2023-04-01 MED ADMIN — HYDROmorphone (PF) (DILAUDID) injection 1 mg: 1 mg | INTRAVENOUS | @ 12:00:00 | Stop: 2023-04-11

## 2023-04-01 MED ADMIN — oxyCODONE (ROXICODONE) 5 mg/5 mL solution 20 mg: 20 mg | ORAL | @ 03:00:00 | Stop: 2023-04-09

## 2023-04-01 MED ADMIN — vancomycin (VANCOCIN) 750 mg in dextrose 5 % 150 mL IVPB (premix): 750 mg | INTRAVENOUS | @ 21:00:00 | Stop: 2023-04-02

## 2023-04-01 MED ADMIN — ondansetron (ZOFRAN) injection 8 mg: 8 mg | INTRAVENOUS | @ 12:00:00

## 2023-04-01 MED ADMIN — promethazine (PHENERGAN) 12.5 mg in sodium chloride (NS) 0.9 % 50 mL IVPB: 12.5 mg | INTRAVENOUS | @ 23:00:00

## 2023-04-01 MED ADMIN — sodium chloride (NS) 0.9 % flush 10 mL: 10 mL | INTRAVENOUS | @ 03:00:00

## 2023-04-01 MED ADMIN — promethazine (PHENERGAN) 12.5 mg in sodium chloride (NS) 0.9 % 50 mL IVPB: 12.5 mg | INTRAVENOUS | @ 07:00:00

## 2023-04-01 MED ADMIN — HYDROmorphone (PF) (DILAUDID) injection 1 mg: 1 mg | INTRAVENOUS | @ 17:00:00 | Stop: 2023-04-11

## 2023-04-01 MED ADMIN — promethazine (PHENERGAN) 12.5 mg in sodium chloride (NS) 0.9 % 50 mL IVPB: 12.5 mg | INTRAVENOUS | @ 17:00:00

## 2023-04-01 MED ADMIN — buprenorphine 20 mcg/hour transdermal patch 1 patch: 1 | TRANSDERMAL | @ 15:00:00

## 2023-04-01 MED ADMIN — sodium chloride (NS) 0.9 % flush 10 mL: 10 mL | INTRAVENOUS | @ 12:00:00

## 2023-04-01 MED ADMIN — promethazine (PHENERGAN) 12.5 mg in sodium chloride (NS) 0.9 % 50 mL IVPB: 12.5 mg | INTRAVENOUS | @ 12:00:00

## 2023-04-01 MED ADMIN — oxyCODONE (ROXICODONE) 5 mg/5 mL solution 20 mg: 20 mg | ORAL | @ 21:00:00 | Stop: 2023-04-09

## 2023-04-01 MED ADMIN — ammonium lactate (LAC-HYDRIN) 12 % lotion 1 Application: 1 | TOPICAL | @ 15:00:00

## 2023-04-01 MED ADMIN — escitalopram (LEXAPRO) oral solution: 5 mg | ENTERAL | @ 15:00:00

## 2023-04-01 MED ADMIN — fluticasone propionate (FLONASE) 50 mcg/actuation nasal spray 1 spray: 1 | TOPICAL | @ 15:00:00

## 2023-04-01 MED ADMIN — cetirizine (ZYRTEC) oral syrup: 20 mg | ENTERAL | @ 15:00:00

## 2023-04-01 MED ADMIN — diphenhydrAMINE (BENADRYL) injection: 50 mg | INTRAVENOUS | @ 19:00:00

## 2023-04-01 MED ADMIN — famotidine (PEPCID) oral suspension: 20 mg | ENTERAL | @ 03:00:00

## 2023-04-01 MED ADMIN — ondansetron (ZOFRAN) injection 8 mg: 8 mg | INTRAVENOUS | @ 03:00:00

## 2023-04-01 MED ADMIN — scopolamine (TRANSDERM-SCOP) 1 mg over 3 days topical patch 1 mg: 1 | TOPICAL | @ 15:00:00

## 2023-04-01 MED ADMIN — promethazine (PHENERGAN) 12.5 mg in sodium chloride (NS) 0.9 % 50 mL IVPB: 12.5 mg | INTRAVENOUS

## 2023-04-01 MED ADMIN — sodium chloride (NS) 0.9 % flush 10 mL: 10 mL | INTRAVENOUS | @ 19:00:00

## 2023-04-01 MED ADMIN — oxyCODONE (ROXICODONE) 5 mg/5 mL solution 20 mg: 20 mg | ORAL | @ 15:00:00 | Stop: 2023-04-09

## 2023-04-01 MED ADMIN — ondansetron (ZOFRAN) injection 8 mg: 8 mg | INTRAVENOUS | @ 19:00:00

## 2023-04-01 MED ADMIN — ammonium lactate (LAC-HYDRIN) 12 % lotion 1 Application: 1 | TOPICAL | @ 03:00:00

## 2023-04-01 MED ADMIN — vancomycin (VANCOCIN) 750 mg in dextrose 5 % 150 mL IVPB (premix): 750 mg | INTRAVENOUS | @ 05:00:00 | Stop: 2023-04-02

## 2023-04-01 MED ADMIN — oxyCODONE (ROXICODONE) 5 mg/5 mL solution 20 mg: 20 mg | ORAL | @ 08:00:00 | Stop: 2023-04-09

## 2023-04-01 MED ADMIN — lidocaine (ASPERCREME) 4 % 1 patch: 1 | TRANSDERMAL | @ 15:00:00

## 2023-04-01 MED ADMIN — HYDROmorphone (PF) (DILAUDID) injection 1 mg: 1 mg | INTRAVENOUS | @ 23:00:00 | Stop: 2023-04-11

## 2023-04-01 MED ADMIN — HYDROmorphone (PF) (DILAUDID) injection 1 mg: 1 mg | INTRAVENOUS | @ 04:00:00 | Stop: 2023-04-11

## 2023-04-01 MED ADMIN — vancomycin (VANCOCIN) 750 mg in dextrose 5 % 150 mL IVPB (premix): 750 mg | INTRAVENOUS | @ 15:00:00 | Stop: 2023-04-02

## 2023-04-01 NOTE — Unmapped (Signed)
[facility])   Consultation - Liaison (CL) Psychiatry  CL Psychology Follow-Up Note      Service Date:  04/01/23  Admit Date:  03/25/23  Clinician:  Arlana Pouch, PsyD  Intervention: 30 min Individual Psychotherapy  Face-to-Face Clinical Contact with Patient: 20 min  Method of Interaction: In-Person    BACKGROUND INFORMATION AND REASON FOR REFERRAL:   Please see H&P and other recent records for full details. 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 The patient was admitted to?[facility] Hospitalist service for management?of the concerns above. CL Psychology services were requested for evaluation and psychotherapy to address anxiety.    Upon initial evaluation, the patient endorsed recurrent episodes depressive symptoms, including depressed mood, hopelessness, and low self-worth that began when she was approximately 23yo in the context of health-related and interpersonal stressors that is consistent with major depressive disorder. She also endorsed lifelong anxiety about numerous domains that is consistent with generalized anxiety disorder. Additionally, Pt endorsed history of significant medical trauma and associated recurrent nightmares, flashbacks, dissociative episodes, hypervigilance,and avoidance of trauma-related reminders that is concerning for PTSD. Pt is amenable to working with Psychology on techniques to minimize anxiety that may be interfering with care progression during admission.     The patient was seen today for follow-up psychotherapy session to address anxiety.    ASSESSMENT  IMPRESSIONS/SUMMARY    At follow-up, Pt shared concerns about feeling unheard and experiencing limited autonomy in her care plan. Reviewed how anxiety and trauma may interact with and potentially amplify physical symptoms (e.g., pain, nausea). Emphasized that patient's symptoms are not considered to be solely psychiatric in nature and are being taken seriously within the broader context of her health.     DIAGNOSTIC IMPRESSIONS    Major depressive disorder, recurrent episode, moderate  Generalized anxiety disorder  Posttraumatic stress disorder    Risk Assessment:  ASQ screening result: not completed    -A full risk assessment was previously performed on 03/31/23.  Risk assessment remains essentially unchanged.    Current suicide risk: low risk  Current homicide risk: low risk          PLAN  RECOMMENDATIONS    ## Safety and Observation Level:   -This patient is not currently under IVC. If safety concerns arise, please page psychiatry for an evaluation. Recommend routine level of observation per primary team.    ## Follow-up:  - The patient desires ongoing follow-up from CL Psychology while medically inpatient.  - The patient is being followed by Psychiatry.    ## Disposition:  -There are no psychological contraindications to discharging this patient when medically appropriate.   - When this patient is discharged, please ensure that their AVS includes information about the 56 Suicide & Crisis Lifeline.      SUBJECTIVE  SESSION CONTENT     Session began with a brief check-in. Pt shared concerns that her symptoms are being dismissed as psychiatric in origin based on conversations with care teams. Clarified the role of psychological factors, such as anxiety and trauma, in potentially amplifying physical symptoms like pain and nausea. The patient was assured that her symptoms are not viewed as solely psychiatric in nature. Pt indicated she remains amenable to learning techniques that may reduce anxiety and help her progress towards her care goals.     OBJECTIVE  MENTAL STATUS     Orientation and consciousness: AOx4   Appearance: Appears stated age  Behavior: Calm, Cooperative, Direct eye contact, and Polite  Speech: Within normal limits  Language: Intact  Mood: Anxious  Affect: Full and Mood congruent  Perceptual disturbance (hallucinations, illusions): None  Thought process and association: Linear and coherent  Thought content (delusions, obsessions etc.): None  Insight: Intact  Judgement: Intact  Memory: WNL, although not formally assessed     EDUCATION/INTERVENTIONS:    -Completed mood check-in and reviewed the patient's current stressors and strategies to manage stress.  -Supportive interventions   -Discussed the patient's treatment goals, revisited discussion of treatment options, and  explored goals of care.

## 2023-04-01 NOTE — Unmapped (Signed)
[facility] Gastro[facility]ology Consult Service   Progress Note         Assessment and Recommendations:   [name] [name] is a 23 y.o. female with a PMHx of [name] ([name]) s/p proctocolectomy w/ileoanal anastomosis in 2022, desmoid tumors (currently on nirogacestat, previously on sorafenib), anemia, previously on TPN and prolonged tube feeds, chronic pain, PTSD  who presented to [facility] with recurrent abdominal pain, pre-syncope, hematochezia, weight loss. The patient is seen in consultation at the request of Dolores Patty, MD ([facility])) for  multiple GI complaints .    Acute on chronic abdominal pain - Poor oral intake  Patient has had longstanding concerns over nutritional status, has previously required TPN and enteral feeding, without clear evidence of weight loss despite compliance with prescribed regimens. Patient reports intolerance to all oral intake, including oral medications because of severity of pain and nausea. She reports anxiety about oral intake introducing GI bleeding. Patient has previously had abdominal imaging without acute obstruction, no history of short bowel syndrome and no other prior structural abnormalities on EGD that would physiologically prevent patient from eating by mouth. Although patient has objectively lost weight (30lbs since ~summer, unclear if related to fluid overload), no evidence of low albumin, electrolyte derangements/AKI. Goal during early portion of hospitalization was to hopefully break the cycle of acuity of symptoms, although this has largely been unsuccessful. We are now focusing on more carefully addressing nutritional +psychosocial distress surrounding chronic illness needs.  Hospitalization overall has been very challenging for both patient and providers as patient continues to exhibit lack of progress in her symptoms and continued request for IV medications despite no physiologic barriers to absorption.  It is becoming more clear throughout hospitalization that patient very likely has co-morbid psychiatric illness contributing to worsening her GI symptoms. Explained to patient that this is not the underlying etiology entirely but one part of treatment that we want to address.     - No acute indication for initiation of TPN, GI has discussed with patient that this request has been declined for multiple reasons not limited to potential acute blood stream infection.  - s/p post-pyloric placement of Corpak and currently at goal tube feed rate. Not a candidate for PEG tube placement due to desmoid tumors  - Recommend switching to IV PPI given heartburn symptoms reported today  - Agree with Nutrition and Pain Management consults, support the decision to offer oral medications first-line and avoiding IV anticholinergics, IV opioids as able.  - Continue scheduled IV Zofran and IV promethazine for breakthrough nausea.  -Agree with methods of psychosocial support while inpatient. Would benefit from GI specific psychologist outpatient.   - LFTs likely rising due to drug effect, continue to trend    [name] s/p total colectomy + ileal pouch   She was recently hospitalized on the inpatient solid oncology service with hematochezia and c/f for recurrent pouchitis from 03/08/23 to 03/20/23. Pouchoscopy on 1/3 showed intact pouch with minimal inflammation. Low suspicion for active pouchitis or antimicrobial therapy failure contributing to current symptomatology. In addition, do not think that there is an ongoing GI source of bacteremia contributing. Will need to monitor clinical trajectory inpatient, low suspicion for clinically significant GIB given hemoglobin is within normal limits and no witnessed GI bleeding while admitted. Patient may have some hemorrhoidal bleeding contributing but low suspicion for inflammatory disorder of the pouch.     - Continue GIB precautions while inpatient with appropriate access, at least daily CBC, active T&S.   -  Asked patient to maintain stool diary with photodocumentation of hematochezia  - No acute indication for repeat pouchoscopy, outpatient EGD for surveillance of EGD is appropriate no indication for expedited inpatient examination  - Appreciate ID recommendations for risk/benefit of antimicrobial therapy and patient reported side effects towards IV antibiotics    Issues Impacting Complexity of Management:  -Discussed this patient's care with Dr. Britt Bolognese from the Gastroenterology service as summarized in this note    Recommendations discussed with the patient's primary team. We will continue to follow along with you.    History of Present Illness:     Interval events: Patient reports significant continued symptoms, almost verbatim unchanged since day of admission.  She reports significant nausea and abdominal pain at the site of her tumors with any oral intake.  She continues to express anxiety that oral intake with lead to GI bleeding. She also is reporting burning in her esophagus and abdomen. She continues to endorse poor quality of life and extreme frustration with her symptoms.    -I have reviewed the patient's prior records from [facility], [facility], [facility] as summarized in the HPI    Objective:   Temp:  [36.7 ??C (98.1 ??F)-37.1 ??C (98.8 ??F)] 36.8 ??C (98.2 ??F)  Heart Rate:  [74-87] 74  Resp:  [16-18] 16  BP: (102-110)/(59-71) 102/59  SpO2:  [98 %-99 %] 99 %    Gen: WDWN female, anxious appearing, answers questions appropriately  Abdomen: Soft, diffuse tenderness to palpation, non-distended, no rebound/guarding, no hepatosplenomegaly. Surgical scars present and intact.   Extremities: No edema in the BLEs    Wt Readings from Last 12 Encounters:   03/27/23 51.1 kg (112 lb 9.6 oz)   03/24/23 52.8 kg (116 lb 6.4 oz)   03/17/23 53.5 kg (118 lb)   02/24/23 54.1 kg (119 lb 4.8 oz)   01/23/23 57.4 kg (126 lb 9.6 oz)   01/21/23 58 kg (127 lb 12.8 oz)   01/14/23 58 kg (127 lb 12.8 oz)   12/16/22 58.5 kg (129 lb) 12/16/22 58.5 kg (129 lb)   12/08/22 61.3 kg (135 lb 3.2 oz)   11/14/22 61 kg (134 lb 8 oz)   09/24/22 62.4 kg (137 lb 9.6 oz)        Pertinent Labs/Studies:  -I have reviewed the patient's labs from 04/01/23 which show stable Hgb, stable renal function (SCr, electrolytes), and worsening LFTs    Pouchoscopy biopsy:  A: Small bowel, pouch, biopsy  - Erosive moderately active chronic enteritis  - No CMV viral cytopathic effect, granuloma, or dysplasia identified     Pouchoscopy 1/3:   Intact pouch with minimal inflammation and several superficial ulcerations at the pouch anastamosis and pre-pouch inlet, suggestive of healing pouchitis.     Other scopes:  Pouch exam 07/2022 - normal ileum, rectal cuff appeared normal with the exception of small rectal polpys, which were treated with APC   Pouch exam 03/2022 - friable mucosa a ileoanal pouch suture line, treated with APC   Pouch exam 02/2022 - anal fissure, 10mm circumferential ulcer oozing at the ileocolonic anastomosis, treated with APC   Pouch exam 01/2022 - anal fissure, ulcer at ileocolonic anastomosis   EGD 01/2022 - multple polyps in the stomach, normal esophagus, duodenum and jejunum   EGD 08/2021- fundic gland polyps in the stomach, otherwise normal  EGD 2022- fundic gland polyps in the stomach, adenomatous tissue removed from small bowel on random biopsy to rule out celiac disease (none seen  endoscopically)      01/23/2023 MRI A/P:  - Overall slightly increased size of the ill-defined enhancing soft tissue in the central lower mesentery extending into the pelvis/presacral region with associated small bowel tethering corresponding to desmoid tumor.   - Sequela of proctocolectomy and ileoanal anastomosis with grossly similar dilation of bowel proximal to the anastomosis.   -Three enhancing soft tissue lesions in the right rectus musculature, the largest of which is smaller in size compared to 11/11/2022.  - Slightly increased size of multiple prominent central mesenteric lymph nodes measuring up to 1.0 cm which may be reactive.  - Increased size of a nonenhancing 1.9 cm left interpolar parapelvic cyst with layering internal hemorrhagic debris.  - Marked narrowing of the left renal vein as it crosses between the SMA and aorta, correlate clinically for signs of nutcracker syndrome.  - Findings suggestive of iron deposition within the liver.  - Numerous cystic foci along the pelvic wall without definite enhancement as described in the body of the report, correlate with physical examination.

## 2023-04-01 NOTE — Unmapped (Signed)
Department of Anesthesiology  Pain Medicine Division    Chronic Pain inpatient Consult follow up Note      Requesting Attending Physician:  Dolores Patty, MD  Service Requesting Consult:  Med Bernita Raisin [facility])    Assessment/Recommendations:  The patient was seen in consultation on request of Dolores Patty, MD regarding assistance with pain management. The patient is not obtaining adequate pain relief on current medication regimen.     [name] [name] is a 23 year old female with a complex medical history, including DVT (RUE), [name] syndrome (post-colectomy), desmoid tumors, anemia, and severe protein-calorie malnutrition. She is followed at [facility] for chronic abdominal and right shoulder pain, consistent with cancer-associated pain. 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 Sangeeta???s pain has worsened in recent months, likely due to intermittent intestinal obstructions related to her mesenteric desmoid tumors, which may cause intermittent bowel intussusception. Her pain is further complicated by narcotic bowel syndrome, visceral hyperalgesia, and a positive feedback loop where increased opioid use exacerbates the pain. [name] [name]???s previous hospitalization was complicated by ileus, requiring NG tube, a prolonged inpatient rehab stay.  During her most recent hospitalization she was found to have some pouchitis and was continued on oral antibiotic.      Interval: NAEO. Patient asleep in bed. Per mom at patient's bedside, Alisah's tube feeds were up-titrated today. Agreeable to not making any changes to pain regimen today.     Utilized liquid Oxy 20mg  x5 and IV HM x6 in last 24 hours.      Recommendations:  -The chronic pain service is a consult service and does not place orders, just makes recommendations (except ketamine and lidocaine infusions)  -Please evaluate all patients on opioids for appropriateness of prescribing narcan at discharge.  The chronic pain service can assist with this.  Nasal narcan covered by most insurance.  -Recommendations given apply to the current hospitalization and do not reflect long term recommendations.     Recommended to   -Continue Butrans patch 20 mcg/h q. Weekly  -Aggressively address nausea symptoms per primary team choice  -Continue to support patient from multidisciplinary approach   -Continue oxycodone liquid 20 mg every 4 hours as needed (1st line therapy for severe pain), please encourage patient to utilize this modality, unless she has intractable vomiting despite antiemetic  -Continue hydromorphone 1 mg IV every 4 hours as needed (2nd line therapy for severe pain). (2nd line)  -Continue Tylenol 1 g 3 times daily    We will plan to reassess on Friday, 04/03/23    We will continue to follow her. Chronic pain service will be available to contact via pager.     Please prescribe naloxone at discharge.  Nasal narcan for most insured (Nasal narcan 4mg /actuation, prescribe 1 kit, instructions at [email or web address]).  For uninsured, chronic pain can work to assist in finding an option.  OTC nasal narcan now available at most pharmacies for around $45.    Allergies    Allergies   Allergen Reactions    Adhesive Tape-Silicones Hives and Rash     Paper tape and Tegederm ok. Biopatch causes blistering but has tolerated CHG Tegaderm    Ferrlecit [Sodium Ferric Gluconat-Sucrose] Swelling and Rash    Levofloxacin Swelling and Rash     Swelling in mouth, rash,     Methylnaltrexone      Per Patient: I lost bowel control, severe abdominal cramping, and  elevated BP    Neomycin Swelling     Rxn after ear drops; ear swelling    Papaya Hives    Morphine Nausea And Vomiting    Zosyn [Piperacillin-Tazobactam] Itching and Rash     Red and itchy    Compazine [Prochlorperazine] Other (See Comments)     Extreme agitation    Iron Analogues     Reglan [Metoclopramide Hcl] Diarrhea Iron Dextran Itching     Received iron dextran 06/08/22 over 12 hours, had itching and redness/flushing during the infusion and for a couple days after. Required IV benadryl w/flares between doses and ultimately treated w/IV methylpred for 2 days.     Latex, Natural Rubber Rash       Home Medications    Medications Prior to Admission   Medication Sig Dispense Refill Last Dose/Taking    acetaminophen (TYLENOL) 500 MG tablet Take 2 tablets (1,000 mg total) by mouth every eight (8) hours.       buprenorphine (BUTRANS) 20 mcg/hour PTWK transdermal patch Place 1 patch on the skin every seven (7) days. 4 patch 1     cefdinir (OMNICEF) 300 MG capsule Take 1 capsule (300 mg total) by mouth every twelve (12) hours for 19 days, THEN 1 capsule (300 mg total) daily for 28 days. 66 capsule 0     COURIERED MED OR SUPPLY OGSVEO 50 MG TABLET. 1 each 0     escitalopram oxalate (LEXAPRO) 5 MG tablet Take 1 tablet (5 mg total) by mouth daily. 30 tablet 0     famotidine (PEPCID) 20 MG tablet Take 1 tablet (20 mg total) by mouth nightly. 30 tablet 3     fluticasone propionate (FLONASE) 50 mcg/actuation nasal spray Use 1 spray under the butrans patch once every 7 days to prevent allergic reaction to the adhesive       hydrocortisone 2.5 % cream Apply topically two (2) times a day as needed (hemorrhoids). 30 g 0     lidocaine (ASPERCREME) 4 % patch Place 1 patch on the skin daily as needed.       LORazepam (ATIVAN) 1 MG tablet Take 1 tablet (1 mg total) by mouth two (2) times a day as needed for anxiety. 60 tablet 0     naloxegol (MOVANTIK) 12.5 mg tablet Take 1 tablet (12.5 mg total) by mouth daily. (Patient taking differently: Take 1 tablet (12.5 mg total) by mouth daily as needed.) 30 tablet 0     naloxone (NARCAN) 4 mg nasal spray One spray in either nostril once for known/suspected opioid overdose. May repeat every 2-3 minutes in alternating nostril til EMS arrives 2 each 0     nirogacestat (OGSIVEO) 50 mg tablet Take 3 tablets (150 mg total) by mouth two (2) times a day. Swallow tablets whole; do not break, crush, or chew. 180 tablet 5     ondansetron (ZOFRAN-ODT) 8 MG disintegrating tablet Dissolve 1 tablet (8 mg total) in the mouth every eight (8) hours as needed for nausea. 90 tablet 0     [EXPIRED] oxyCODONE (ROXICODONE) 5 mg/5 mL solution Take 20 mL (20 mg total) by mouth every four (4) hours as needed for up to 7 days. 840 mL 0     pantoprazole (PROTONIX) 40 MG tablet Take 1 tablet (40 mg total) by mouth daily before breakfast. 30 tablet 0     promethazine (PHENERGAN) 12.5 MG tablet Take 1 tablet (12.5 mg total) by mouth every twelve (12) hours as needed for nausea. 14  tablet 0     scopolamine (TRANSDERM-SCOP) 1 mg over 3 days Place 1 patch (1 mg total) on the skin every third day. 10 patch 2     sucralfate (CARAFATE) 100 mg/mL suspension Take 10 mL (1 g total) by mouth four (4) times a day as needed.       zinc oxide-cod liver oil (DESITIN 40%) 40 % Pste Apply topically daily as needed. 57 g 0        Inpatient Medications  Current Facility-Administered Medications   Medication Dose Route Frequency Provider Last Rate Last Admin    acetaminophen (TYLENOL) oral liquid  1,000 mg Enteral tube: post-pyloric (duodenum, jejunum) Q8H SCH [name], MD        ammonium lactate (LAC-HYDRIN) 12 % lotion 1 Application  1 Application Topical BID [name], MD   1 Application at 04/01/23 1012    buprenorphine 20 mcg/hour transdermal patch 1 patch  1 patch Transdermal Weekly Dholakia, Vibhaben, AGNP   1 patch at 04/01/23 1011    cetirizine (ZYRTEC) oral syrup  20 mg Enteral tube: post-pyloric (duodenum, jejunum) BID [name], MD   20 mg at 04/01/23 9811    cholecalciferol (vitamin D3 25 mcg (1,000 units)) tablet 25 mcg  25 mcg Oral Daily Dholakia, Vibhaben, AGNP        diphenhydrAMINE (BENADRYL) injection  50 mg Intravenous Daily PRN Arman Filter, MD   50 mg at 04/01/23 1330    diphenhydrAMINE (BENADRYL) oral elixir  50 mg Enteral tube: post-pyloric (duodenum, jejunum) Q8H [name], MD   50 mg at 03/31/23 1233    escitalopram (LEXAPRO) oral solution  5 mg Enteral tube: post-pyloric (duodenum, jejunum) Daily [name], MD   5 mg at 04/01/23 0950    famotidine (PEPCID) oral suspension  20 mg Enteral tube: post-pyloric (duodenum, jejunum) Nightly [name], MD   20 mg at 03/31/23 2141    fluticasone propionate (FLONASE) 50 mcg/actuation nasal spray 1 spray  1 spray Topical Weekly Dholakia, Vibhaben, AGNP   1 spray at 04/01/23 1011    hydrocortisone 2.5 % cream   Topical BID PRN Dholakia, Vibhaben, AGNP        HYDROmorphone (PF) (DILAUDID) injection 1 mg  1 mg Intravenous Q4H PRN [name], MD   1 mg at 04/01/23 1208    IP OKAY TO TREAT   Other Continuous PRN Dholakia, Vibhaben, AGNP        IP OKAY TO TREAT   Other Continuous PRN Dholakia, Vibhaben, AGNP        IP OKAY TO TREAT   Other Continuous PRN Dholakia, Vibhaben, AGNP        lidocaine (ASPERCREME) 4 % 1 patch  1 patch Transdermal Daily Dholakia, Vibhaben, AGNP   1 patch at 04/01/23 0950    LORazepam (ATIVAN) tablet 1 mg  1 mg Oral BID PRN Dholakia, Vibhaben, AGNP   1 mg at 03/29/23 1243    melatonin tablet 3 mg  3 mg Oral Nightly PRN [name], MD        mirtazapine (REMERON) tablet 7.5 mg  7.5 mg Oral Nightly [name], MD        multivitamin with folic acid 400 mcg tablet 1 tablet  1 tablet Oral Daily [name], MD        naloxegol (MOVANTIK) 12.5 mg tablet 12.5 mg  12.5 mg Oral Daily PRN Dholakia, Vibhaben, AGNP  nirogacestat (OGSIVEO) tablet 150 mg  150 mg Oral BID Dholakia, Vibhaben, AGNP        ondansetron (ZOFRAN) injection 8 mg  8 mg Intravenous Q8H Dholakia, Vibhaben, AGNP   8 mg at 04/01/23 1330    oxyCODONE (ROXICODONE) 5 mg/5 mL solution 20 mg  20 mg Oral Q4H PRN [name], MD   20 mg at 04/01/23 0950    pantoprazole (Protonix) injection 40 mg  40 mg Intravenous BID Dolores Patty, MD        promethazine (PHENERGAN) 12.5 mg in sodium chloride (NS) 0.9 % 50 mL IVPB  12.5 mg Intravenous Q6H [name], MD   Stopped at 04/01/23 1236    scopolamine (TRANSDERM-SCOP) 1 mg over 3 days topical patch 1 mg  1 patch Topical Q72H Dholakia, Vibhaben, AGNP   1 mg at 04/01/23 0951    sodium chloride (NS) 0.9 % flush 10 mL  10 mL Intravenous Q8H [name], MD   10 mL at 04/01/23 1330    thiamine mononitrate (vit B1) tablet 200 mg  200 mg Oral Daily [name], MD        vancomycin (VANCOCIN) 750 mg in dextrose 5 % 150 mL IVPB (premix)  750 mg Intravenous Q8H [name], MD   Stopped at 04/01/23 1047         Past Medical History    Past Medical History:   Diagnosis Date    Abdominal pain     Acid reflux     occas    Anesthesia complication     itching, shaking, coldness; last few surgeries have gone much better    Cancer (CMS-HCC)     Cataract of right eye     COVID-19 virus infection 01/2019    Cyst of thyroid determined by ultrasound     monitoring    [name]     2 right forearm, 1 left thigh, 1 right scapula, 1 under left clavicle; multiple    Difficult intravenous access     [name] (familial adenomatous polyposis)     [name]     Gastric polyps     History of chemotherapy     last treatment approx 05/2019    History of colon polyps     History of COVID-19 01/2019    Ileus (CMS-HCC) 03/16/2022    Iron deficiency anemia due to chronic blood loss     received iron infusion 11-2019    PONV (postoperative nausea and vomiting)     Rectal bleeding     Syncopal episodes     especially if becoming dehydrated           Past Surgical History    Past Surgical History:   Procedure Laterality Date    COLON SURGERY      cyroablation      cystis removal      desmoid removal      PR CLOSE ENTEROSTOMY,RESEC+ANAST N/A 10/09/2020    Procedure: ILEOSTOMY TAKEDOWN;  Surgeon: Mickle Asper, MD;  Location: OR [place];  Service: General Surgery    PR COLONOSCOPY W/BIOPSY SINGLE/MULTIPLE N/A 10/27/2012    Procedure: COLONOSCOPY, FLEXIBLE, PROXIMAL TO SPLENIC FLEXURE; WITH BIOPSY, SINGLE OR MULTIPLE;  Surgeon: Shirlyn Goltz Mir, MD;  Location: PEDS PROCEDURE ROOM [facility];  Service: Gastroenterology    PR COLONOSCOPY W/BIOPSY SINGLE/MULTIPLE N/A 09/14/2013    Procedure: COLONOSCOPY, FLEXIBLE, PROXIMAL TO SPLENIC FLEXURE; WITH BIOPSY, SINGLE OR MULTIPLE;  Surgeon: Shirlyn Goltz Mir, MD;  Location: PEDS PROCEDURE ROOM [facility];  Service: Gastroenterology    PR COLONOSCOPY W/BIOPSY SINGLE/MULTIPLE N/A 11/08/2014    Procedure: COLONOSCOPY, FLEXIBLE, PROXIMAL TO SPLENIC FLEXURE; WITH BIOPSY, SINGLE OR MULTIPLE;  Surgeon: Arnold Long Mir, MD;  Location: PEDS PROCEDURE ROOM [facility];  Service: Gastroenterology    PR COLONOSCOPY W/BIOPSY SINGLE/MULTIPLE N/A 12/26/2015    Procedure: COLONOSCOPY, FLEXIBLE, PROXIMAL TO SPLENIC FLEXURE; WITH BIOPSY, SINGLE OR MULTIPLE;  Surgeon: Arnold Long Mir, MD;  Location: PEDS PROCEDURE ROOM [facility];  Service: Gastroenterology    PR COLONOSCOPY W/BIOPSY SINGLE/MULTIPLE N/A 09/02/2017    Procedure: COLONOSCOPY, FLEXIBLE, PROXIMAL TO SPLENIC FLEXURE; WITH BIOPSY, SINGLE OR MULTIPLE;  Surgeon: Arnold Long Mir, MD;  Location: PEDS PROCEDURE ROOM [place];  Service: Gastroenterology    PR COLSC FLX W/REMOVAL LESION BY HOT BX FORCEPS N/A 08/27/2016    Procedure: COLONOSCOPY, FLEXIBLE, PROXIMAL TO SPLENIC FLEXURE; W/REMOVAL TUMOR/POLYP/OTHER LESION, HOT BX FORCEP/CAUTE;  Surgeon: Arnold Long Mir, MD;  Location: PEDS PROCEDURE ROOM [facility];  Service: Gastroenterology    PR COLSC FLX W/RMVL OF TUMOR POLYP LESION SNARE TQ N/A 02/25/2019    Procedure: COLONOSCOPY FLEX; W/REMOV TUMOR/LES BY SNARE;  Surgeon: Helyn Numbers, MD;  Location: [facility]URES [place] [facility];  Service: Gastroenterology    PR COLSC FLX W/RMVL OF TUMOR POLYP LESION SNARE TQ N/A 03/13/2020    Procedure: COLONOSCOPY FLEX; W/REMOV TUMOR/LES BY SNARE;  Surgeon: Helyn Numbers, MD;  [facility]: [facility];  Service: Gastroenterology    PR EXC SKIN BENIG 2.1-3 CM TRUNK,ARM,LEG Right 02/25/2017    Procedure: EXCISION, BENIGN LESION INCLUDE MARGINS, EXCEPT SKIN TAG, LEGS; EXCISED DIAMETER 2.1 TO 3.0 CM;  Surgeon: Clarene Duke, MD;  Location: CHILDRENS OR [facility];  Service: Plastics    PR EXC SKIN BENIG 3.1-4 CM TRUNK,ARM,LEG Right 02/25/2017    Procedure: EXCISION, BENIGN LESION INCLUDE MARGINS, EXCEPT SKIN TAG, ARMS; EXCISED DIAMETER 3.1 TO 4.0 CM;  Surgeon: Clarene Duke, MD;  Location: CHILDRENS OR [facility];  Service: Plastics    PR EXC SKIN BENIG >4 CM FACE,FACIAL Right 02/25/2017    Procedure: EXCISION, OTHER BENIGN LES INCLUD MARGIN, FACE/EARS/EYELIDS/NOSE/LIPS/MUCOUS MEMBRANE; EXCISED DIAM >4.0 CM;  Surgeon: Clarene Duke, MD;  Location: CHILDRENS OR [facility];  Service: Plastics    PR EXC TUMOR SOFT TISSUE LEG/ANKLE SUBQ 3+CM Right 08/05/2019    Procedure: EXCISION, TUMOR, SOFT TISSUE OF LEG OR ANKLE AREA, SUBCUTANEOUS; 3 CM OR GREATER;  Surgeon: Arsenio Katz, MD;  Location: MAIN OR [place];  Service: Plastics    PR EXC TUMOR SOFT TISSUE LEG/ANKLE SUBQ <3CM Right 08/05/2019    Procedure: EXCISION, TUMOR, SOFT TISSUE OF LEG OR ANKLE AREA, SUBCUTANEOUS; LESS THAN 3 CM;  Surgeon: Arsenio Katz, MD;  Location: MAIN OR [facility];  Service: Plastics    PR LAP, SURG PROCTECTOMY W J-POUCH N/A 08/10/2020    Procedure: ROBOTIC ASSISTED LAPAROSCOPIC PROCTOCOLECTOMY, ILEAL J POUCH, WITH OSTOMY;  Surgeon: Mickle Asper, MD;  Location: OR [place];  Service: General Surgery    PR NDSC EVAL INTSTINAL POUCH DX W/COLLJ SPEC SPX N/A 01/23/2021    Procedure: ENDO EVAL SM INTEST POUCH; DX;  Surgeon: Modena Nunnery, MD;  Location: [facility] [place] [facility];  Service: Gastroenterology    PR NDSC EVAL INTSTINAL POUCH DX W/COLLJ SPEC SPX N/A 08/27/2021    Procedure: ENDO EVAL SM INTEST POUCH; DX;  Surgeon: Hunt Oris, MD;  Location: GI PROCEDURES [facility]; Service: Gastroenterology    PR NDSC EVAL INTSTINAL POUCH DX W/COLLJ SPEC SPX N/A  12/09/2021    Procedure: ENDO EVAL SM INTEST POUCH; DX;  Surgeon: Vidal Schwalbe, MD;  Location: GI PROCEDURES [facility];  Service: Gastroenterology    PR NDSC EVAL INTSTINAL POUCH DX W/COLLJ [facility] SPX Left 04/09/2022    Procedure: ENDO EVAL SM INTEST POUCH; DX;  Surgeon: Modena Nunnery, MD;  Location: [facility] [place] [facility];  Service: Gastroenterology    PR NDSC EVAL INTSTINAL POUCH DX W/COLLJ SPEC SPX N/A 08/05/2022    Procedure: ENDO EVAL SM INTEST POUCH; DX;  Surgeon: Modena Nunnery, MD;  Location: GI PROCEDURES [facility];  Service: Gastroenterology    PR NDSC EVAL INTSTINAL POUCH DX W/COLLJ SPEC SPX N/A 03/13/2023    Procedure: ENDO EVAL SM INTEST POUCH; DX;  Surgeon: Carmon Ginsberg, MD;  Location: GI PROCEDURES [facility];  Service: Gastroenterology    PR NDSC EVAL INTSTINAL POUCH W/BX SINGLE/MULTIPLE N/A 01/20/2022    Procedure: ENDOSCOPIC EVAL OF SMALL INTESTINAL POUCH; DIAGNOSTIC, No biopsies;  Surgeon: Andrey Farmer, MD;  Location: GI PROCEDURES [facility];  Service: Gastroenterology    PR NDSC EVAL INTSTINAL POUCH W/BX SINGLE/MULTIPLE N/A 02/13/2022    Procedure: ENDOSCOPIC EVAL OF SMALL INTESTINAL POUCH; DIAGNOSTIC, WITH BIOPSY;  Surgeon: Bronson Curb, MD;  Location: GI PROCEDURES [facility];  Service: Gastroenterology    PR NDSC EVAL INTSTINAL POUCH W/BX SINGLE/MULTIPLE N/A 03/13/2023    Procedure: ENDOSCOPIC EVAL OF SMALL INTESTINAL POUCH; DIAGNOSTIC, WITH BIOPSY;  Surgeon: Carmon Ginsberg, MD;  Location: GI PROCEDURES [facility]IAL [place];  Service: Gastroenterology    PR UNLISTED PROCEDURE SMALL INTESTINE  01/23/2021    Procedure: UNLISTED PROCEDURE, SMALL INTESTINE;  Surgeon: Modena Nunnery, MD;  Location: [facility] [place] [facility];  Service: Gastroenterology    PR UNLISTED PROCEDURE SMALL INTESTINE  02/13/2022    Procedure: UNLISTED PROCEDURE, SMALL INTESTINE;  Surgeon: Bronson Curb, MD;  Location: GI PROCEDURES [facility];  Service: Gastroenterology    PR UPPER GI ENDOSCOPY,BIOPSY N/A 10/27/2012    Procedure: UGI ENDOSCOPY; WITH BIOPSY, SINGLE OR MULTIPLE;  Surgeon: Shirlyn Goltz Mir, MD;  Location: PEDS PROCEDURE ROOM [facility];  Service: Gastroenterology    PR UPPER GI ENDOSCOPY,BIOPSY N/A 09/14/2013    Procedure: UGI ENDOSCOPY; WITH BIOPSY, SINGLE OR MULTIPLE;  Surgeon: Shirlyn Goltz Mir, MD;  Location: PEDS PROCEDURE ROOM [facility];  Service: Gastroenterology    PR UPPER GI ENDOSCOPY,BIOPSY N/A 11/08/2014    Procedure: UGI ENDOSCOPY; WITH BIOPSY, SINGLE OR MULTIPLE;  Surgeon: Arnold Long Mir, MD;  Location: PEDS PROCEDURE ROOM [facility];  Service: Gastroenterology    PR UPPER GI ENDOSCOPY,BIOPSY N/A 12/26/2015    Procedure: UGI ENDOSCOPY; WITH BIOPSY, SINGLE OR MULTIPLE;  Surgeon: Arnold Long Mir, MD;  Location: PEDS PROCEDURE ROOM [facility];  Service: Gastroenterology    PR UPPER GI ENDOSCOPY,BIOPSY N/A 08/27/2016    Procedure: UGI ENDOSCOPY; WITH BIOPSY, SINGLE OR MULTIPLE;  Surgeon: Arnold Long Mir, MD;  Location: PEDS PROCEDURE ROOM [facility];  Service: Gastroenterology    PR UPPER GI ENDOSCOPY,BIOPSY N/A 09/02/2017    Procedure: UGI ENDOSCOPY; WITH BIOPSY, SINGLE OR MULTIPLE;  Surgeon: Arnold Long Mir, MD;  Location: PEDS PROCEDURE ROOM [facility];  Service: Gastroenterology    PR UPPER GI ENDOSCOPY,BIOPSY N/A 03/13/2020    Procedure: UGI ENDOSCOPY; WITH BIOPSY, SINGLE OR MULTIPLE;  Surgeon: Helyn Numbers, MD;  Location: [facility] [place] [facility];  Service: Gastroenterology    PR UPPER GI ENDOSCOPY,BIOPSY N/A 09/05/2021    Procedure: UGI ENDOSCOPY; WITH BIOPSY, SINGLE OR MULTIPLE;  Surgeon: Wendall Papa, MD;  Location: GI PROCEDURES [facility];  Service: Gastroenterology    PR UPPER GI ENDOSCOPY,DIAGNOSIS N/A 01/20/2022    Procedure: UGI ENDO, INCLUDE ESOPHAGUS, STOMACH, & DUODENUM &/OR JEJUNUM; DX W/WO COLLECTION SPECIMN, BY BRUSH OR WASH;  Surgeon: Andrey Farmer, MD;  Location: GI PROCEDURES [facility];  Service: Gastroenterology    TUMOR REMOVAL      multiple-head, neck, back, hand, right flank, multiple           Family History    Family History   Problem Relation Age of Onset    No Known Problems Mother     No Known Problems Father     No Known Problems Sister     No Known Problems Brother     Stroke Maternal Grandmother     Other Maternal Grandmother         benign lesions of liver and pancreas, further details unknown    Cancer Maternal Grandmother     Diabetes Maternal Grandmother     Hypertension Maternal Grandmother     Thyroid disease Maternal Grandmother     Arthritis Maternal Grandfather     Asthma Maternal Grandfather     COPD Paternal Grandmother         Deceased    Miscarriages / Stillbirths Paternal Grandmother     Alcohol abuse Paternal Grandfather         Deceased    No Known Problems Maternal Aunt     No Known Problems Maternal Uncle     No Known Problems Paternal Aunt     No Known Problems Paternal Uncle     Anesthesia problems Neg Hx     Broken bones Neg Hx     Cancer Neg Hx     Clotting disorder Neg Hx     Collagen disease Neg Hx     Diabetes Neg Hx     Dislocations Neg Hx     Fibromyalgia Neg Hx     Gout Neg Hx     Hemophilia Neg Hx     Osteoporosis Neg Hx     Rheumatologic disease Neg Hx     Scoliosis Neg Hx     Severe sprains Neg Hx     Sickle cell anemia Neg Hx     Spinal Compression Fracture Neg Hx     Melanoma Neg Hx     Basal cell carcinoma Neg Hx     Squamous cell carcinoma Neg Hx          Social History:  Social History     Tobacco Use   Smoking Status Never    Passive exposure: Past   Smokeless Tobacco Never     Social History     Substance and Sexual Activity   Alcohol Use Never     Social History     Substance and Sexual Activity   Drug Use Never         Lab Results   Component Value Date    CREATININE 0.53 (L) 04/01/2023       Lab Results   Component Value Date    ALKPHOS 209 (H) 04/01/2023    BILITOT 0.4 04/01/2023 BILIDIR <0.10 03/17/2023    PROT 6.2 04/01/2023    ALBUMIN 3.5 04/01/2023    ALT 471 (H) 04/01/2023    AST 332 (H) 04/01/2023       Encounter Date: 03/25/23   ECG 12 Lead   Result Value    EKG Systolic BP  EKG Diastolic BP     EKG Ventricular Rate 96    EKG Atrial Rate 96    EKG P-R Interval 134    EKG QRS Duration 78    EKG Q-T Interval 364    EKG QTC Calculation 459    EKG Calculated P Axis 36    EKG Calculated R Axis 40    EKG Calculated T Axis 10    QTC Fredericia 425    Narrative    NORMAL SINUS RHYTHM  NONSPECIFIC T WAVE ABNORMALITY  ABNORMAL ECG  WHEN COMPARED WITH ECG OF 28-Mar-2023 12:13,  NO SIGNIFICANT CHANGE WAS FOUND       No results found for requested labs within last 30 days.       Objective:     Vital Signs  Temp:  [36.7 ??C (98.1 ??F)-37.1 ??C (98.8 ??F)] 36.8 ??C (98.2 ??F)  Heart Rate:  [74-87] 74  Resp:  [16-18] 16  BP: (102-110)/(59-71) 102/59  MAP (mmHg):  [69-84] 69  SpO2:  [98 %-99 %] 99 %    Physical Exam    GENERAL:  Well developed, well-nourished female and is in no apparent distress. Asleep in bed  HEAD/NECK:    Reveals normocephalic/atraumatic. Corpak tube in place  CARDIOVASCULAR:   Regular rate well perfused  LUNGS:   Normal work of breathing, no supplemental 02  EXTREMITIES:  Warm, no clubbing, cyanosis, or edema was noted.  SKIN:  No obvious rashes lesions or erythema        Problem List    Principal Problem:    Cancer associated pain  Active Problems:    [name]    Pain medication agreement signed    Pouchitis (CMS-HCC)

## 2023-04-01 NOTE — Unmapped (Signed)
Patient remained alert and oriented, complained of abdominal pain, itchiness, prn meds given.still on NGT feeds,will continue care.    Problem: Adult Inpatient Plan of Care  Goal: Plan of Care Review  Outcome: Progressing  Goal: Patient-Specific Goal (Individualized)  Outcome: Progressing  Goal: Absence of Hospital-Acquired Illness or Injury  Outcome: Progressing  Intervention: Identify and Manage Fall Risk  Recent Flowsheet Documentation  Taken 03/31/2023 1630 by Dian Situ, RN  Safety Interventions:   fall reduction program maintained   low bed  Taken 03/31/2023 1430 by Dian Situ, RN  Safety Interventions:   fall reduction program maintained   low bed  Taken 03/31/2023 1230 by Dian Situ, RN  Safety Interventions:   fall reduction program maintained   low bed  Taken 03/31/2023 1027 by Dian Situ, RN  Safety Interventions:   fall reduction program maintained   family at bedside   low bed  Taken 03/31/2023 0800 by Dian Situ, RN  Safety Interventions:   fall reduction program maintained   low bed   family at bedside  Intervention: Prevent Skin Injury  Recent Flowsheet Documentation  Taken 03/31/2023 1630 by Dian Situ, RN  Positioning for Skin: Bed in Chair  Taken 03/31/2023 1430 by Dian Situ, RN  Positioning for Skin: Bed in Chair  Taken 03/31/2023 1230 by Dian Situ, RN  Positioning for Skin: Bed in Chair  Taken 03/31/2023 1027 by Dian Situ, RN  Positioning for Skin: Bed in Chair  Taken 03/31/2023 0800 by Dian Situ, RN  Positioning for Skin: Bed in Chair  Skin Protection: incontinence pads utilized  Goal: Optimal Comfort and Wellbeing  Outcome: Progressing  Goal: Readiness for Transition of Care  Outcome: Progressing  Goal: Rounds/Family Conference  Outcome: Progressing     Problem: Infection  Goal: Absence of Infection Signs and Symptoms  Outcome: Progressing     Problem: Latex Allergy  Goal: Absence of Allergy Symptoms  Outcome: Progressing     Problem: Oncology Care  Goal: Effective Coping  Outcome: Progressing  Goal: Improved Activity Tolerance  Outcome: Progressing  Intervention: Promote Improved Energy  Recent Flowsheet Documentation  Taken 03/31/2023 0800 by Dian Situ, RN  Activity Management: ambulated to bathroom  Goal: Optimal Oral Intake  Outcome: Progressing  Goal: Improved Oral Mucous Membrane Integrity  Outcome: Progressing  Goal: Optimal Pain Control and Function  Outcome: Progressing     Problem: Self-Care Deficit  Goal: Improved Ability to Complete Activities of Daily Living  Outcome: Progressing     Problem: Fall Injury Risk  Goal: Absence of Fall and Fall-Related Injury  Outcome: Progressing  Intervention: Promote Injury-Free Environment  Recent Flowsheet Documentation  Taken 03/31/2023 1630 by Dian Situ, RN  Safety Interventions:   fall reduction program maintained   low bed  Taken 03/31/2023 1430 by Dian Situ, RN  Safety Interventions:   fall reduction program maintained   low bed  Taken 03/31/2023 1230 by Dian Situ, RN  Safety Interventions:   fall reduction program maintained   low bed  Taken 03/31/2023 1027 by Dian Situ, RN  Safety Interventions:   fall reduction program maintained   family at bedside   low bed  Taken 03/31/2023 0800 by Dian Situ, RN  Safety Interventions:   fall reduction program maintained   low bed   family at bedside     Problem: Skin Injury Risk Increased  Goal: Skin Health and Integrity  Outcome: Progressing  Intervention: Optimize Skin Protection  Recent Flowsheet Documentation  Taken 03/31/2023 1630 by Dian Situ, RN  Pressure Reduction Techniques: frequent weight shift encouraged  Pressure Reduction Devices: pressure-redistributing mattress utilized  Taken 03/31/2023 1430 by Dian Situ, RN  Pressure Reduction Techniques: frequent weight shift encouraged  Pressure Reduction Devices: pressure-redistributing mattress utilized  Taken 03/31/2023 1230 by Dian Situ, RN  Pressure Reduction Techniques: frequent weight shift encouraged  Pressure Reduction Devices: pressure-redistributing mattress utilized  Taken 03/31/2023 1027 by Dian Situ, RN  Pressure Reduction Techniques: frequent weight shift encouraged  Pressure Reduction Devices: positioning supports utilized  Taken 03/31/2023 0800 by Dian Situ, RN  Activity Management: ambulated to bathroom  Pressure Reduction Techniques: frequent weight shift encouraged  Pressure Reduction Devices: pressure-redistributing mattress utilized  Skin Protection: incontinence pads utilized     Problem: Pain Acute  Goal: Optimal Pain Control and Function  Outcome: Progressing

## 2023-04-02 LAB — CBC
HEMATOCRIT: 33.5 % — ABNORMAL LOW (ref 34.0–44.0)
HEMOGLOBIN: 11 g/dL — ABNORMAL LOW (ref 11.3–14.9)
MEAN CORPUSCULAR HEMOGLOBIN CONC: 32.9 g/dL (ref 32.0–36.0)
MEAN CORPUSCULAR HEMOGLOBIN: 26.9 pg (ref 25.9–32.4)
MEAN CORPUSCULAR VOLUME: 81.7 fL (ref 77.6–95.7)
MEAN PLATELET VOLUME: 10.6 fL (ref 6.8–10.7)
PLATELET COUNT: 186 10*9/L (ref 150–450)
RED BLOOD CELL COUNT: 4.1 10*12/L (ref 3.95–5.13)
RED CELL DISTRIBUTION WIDTH: 16.6 % — ABNORMAL HIGH (ref 12.2–15.2)
WBC ADJUSTED: 9.4 10*9/L (ref 3.6–11.2)

## 2023-04-02 LAB — COMPREHENSIVE METABOLIC PANEL
ALBUMIN: 3.2 g/dL — ABNORMAL LOW (ref 3.4–5.0)
ALKALINE PHOSPHATASE: 180 U/L — ABNORMAL HIGH (ref 46–116)
ALT (SGPT): 278 U/L — ABNORMAL HIGH (ref 10–49)
ANION GAP: 8 mmol/L (ref 5–14)
AST (SGOT): 93 U/L — ABNORMAL HIGH (ref <=34)
BILIRUBIN TOTAL: 0.4 mg/dL (ref 0.3–1.2)
BLOOD UREA NITROGEN: 8 mg/dL — ABNORMAL LOW (ref 9–23)
BUN / CREAT RATIO: 16
CALCIUM: 9.2 mg/dL (ref 8.7–10.4)
CHLORIDE: 102 mmol/L (ref 98–107)
CO2: 32 mmol/L — ABNORMAL HIGH (ref 20.0–31.0)
CREATININE: 0.51 mg/dL — ABNORMAL LOW (ref 0.55–1.02)
EGFR CKD-EPI (2021) FEMALE: >90 mL/min/{1.73_m2} (ref >=60)
GLUCOSE RANDOM: 124 mg/dL (ref 70–179)
POTASSIUM: 3.8 mmol/L (ref 3.4–4.8)
PROTEIN TOTAL: 5.9 g/dL (ref 5.7–8.2)
SODIUM: 142 mmol/L (ref 135–145)

## 2023-04-02 LAB — PROTIME-INR
INR: 1.08
PROTIME: 12.3 s (ref 9.9–12.6)

## 2023-04-02 LAB — MAGNESIUM: MAGNESIUM: 2.1 mg/dL (ref 1.6–2.6)

## 2023-04-02 LAB — PHOSPHORUS: PHOSPHORUS: 4.8 mg/dL (ref 2.4–5.1)

## 2023-04-02 MED ADMIN — promethazine (PHENERGAN) 12.5 mg in sodium chloride (NS) 0.9 % 50 mL IVPB: 12.5 mg | INTRAVENOUS | @ 06:00:00

## 2023-04-02 MED ADMIN — oxyCODONE (ROXICODONE) 5 mg/5 mL solution 20 mg: 20 mg | ORAL | @ 06:00:00 | Stop: 2023-04-09

## 2023-04-02 MED ADMIN — HYDROmorphone (PF) (DILAUDID) injection 1 mg: 1 mg | INTRAVENOUS | @ 18:00:00 | Stop: 2023-04-11

## 2023-04-02 MED ADMIN — HYDROmorphone (PF) (DILAUDID) injection 1 mg: 1 mg | INTRAVENOUS | @ 03:00:00 | Stop: 2023-04-11

## 2023-04-02 MED ADMIN — acetaminophen (TYLENOL) oral liquid: 1000 mg | ENTERAL | @ 18:00:00

## 2023-04-02 MED ADMIN — ammonium lactate (LAC-HYDRIN) 12 % lotion 1 Application: 1 | TOPICAL | @ 15:00:00

## 2023-04-02 MED ADMIN — diphenhydrAMINE (BENADRYL) injection: 50 mg | INTRAVENOUS | @ 06:00:00 | Stop: 2023-04-02

## 2023-04-02 MED ADMIN — HYDROmorphone (PF) (DILAUDID) injection 1 mg: 1 mg | INTRAVENOUS | @ 23:00:00 | Stop: 2023-04-11

## 2023-04-02 MED ADMIN — HYDROmorphone (PF) (DILAUDID) injection 1 mg: 1 mg | INTRAVENOUS | @ 12:00:00 | Stop: 2023-04-11

## 2023-04-02 MED ADMIN — sodium chloride (NS) 0.9 % flush 10 mL: 10 mL | INTRAVENOUS | @ 18:00:00

## 2023-04-02 MED ADMIN — vancomycin (VANCOCIN) 750 mg in dextrose 5 % 150 mL IVPB (premix): 750 mg | INTRAVENOUS | @ 06:00:00 | Stop: 2023-04-02

## 2023-04-02 MED ADMIN — cetirizine (ZYRTEC) oral syrup: 20 mg | ENTERAL | @ 15:00:00

## 2023-04-02 MED ADMIN — oxyCODONE (ROXICODONE) 5 mg/5 mL solution 20 mg: 20 mg | ORAL | @ 16:00:00 | Stop: 2023-04-09

## 2023-04-02 MED ADMIN — ondansetron (ZOFRAN) injection 8 mg: 8 mg | INTRAVENOUS | @ 03:00:00

## 2023-04-02 MED ADMIN — oxyCODONE (ROXICODONE) 5 mg/5 mL solution 20 mg: 20 mg | ORAL | @ 22:00:00 | Stop: 2023-04-09

## 2023-04-02 MED ADMIN — oxyCODONE (ROXICODONE) 5 mg/5 mL solution 20 mg: 20 mg | ORAL | @ 02:00:00 | Stop: 2023-04-09

## 2023-04-02 MED ADMIN — ondansetron (ZOFRAN) injection 8 mg: 8 mg | INTRAVENOUS | @ 18:00:00

## 2023-04-02 MED ADMIN — escitalopram (LEXAPRO) oral solution: 5 mg | ENTERAL | @ 15:00:00

## 2023-04-02 MED ADMIN — promethazine (PHENERGAN) 12.5 mg in sodium chloride (NS) 0.9 % 50 mL IVPB: 12.5 mg | INTRAVENOUS | @ 22:00:00

## 2023-04-02 MED ADMIN — pantoprazole (Protonix) injection 40 mg: 40 mg | INTRAVENOUS | @ 02:00:00

## 2023-04-02 MED ADMIN — pantoprazole (Protonix) injection 40 mg: 40 mg | INTRAVENOUS | @ 15:00:00

## 2023-04-02 MED ADMIN — lidocaine (ASPERCREME) 4 % 1 patch: 1 | TRANSDERMAL | @ 15:00:00

## 2023-04-02 MED ADMIN — sodium chloride (NS) 0.9 % flush 10 mL: 10 mL | INTRAVENOUS | @ 11:00:00

## 2023-04-02 MED ADMIN — promethazine (PHENERGAN) 12.5 mg in sodium chloride (NS) 0.9 % 50 mL IVPB: 12.5 mg | INTRAVENOUS | @ 15:00:00

## 2023-04-02 MED ADMIN — ondansetron (ZOFRAN) injection 8 mg: 8 mg | INTRAVENOUS | @ 11:00:00

## 2023-04-02 MED ADMIN — sodium chloride (NS) 0.9 % flush 10 mL: 10 mL | INTRAVENOUS | @ 03:00:00

## 2023-04-02 NOTE — Unmapped (Addendum)
Hospital Medicine Daily Progress Note    Assessment/Plan:    Principal Problem:    Intractable abdominal pain  Active Problems:    [name]    Cancer associated pain    Pain medication agreement signed    Pouchitis (CMS-HCC)    Elevated liver transaminase level    Positive blood culture   Malnutrition Evaluation as performed by RD, LDN: Severe Protein-Calorie Malnutrition in the context of chronic illness (03/27/23 1311)    Please see wound care flowsheets for wound documentation     LOS: 6 days    [name] [name] is a 23 y.o. 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    Overall: caught in difficult cycle of increasing chronic symptom burden, multiple long hospitalizations, deconditioning. Difficult to have continuity of care and maintenance of rapport with transitions in/out of hospital and rotation of providers. We have agreed to prioritize supportive care and nutrition in stepwise fashion.    # Acute on chronic abdominal pain and nausea  Complex situation discussed at length in prior notes. In brief, her pain and nausea are (deeply) multifactorial but consensus among treatment team is that new / unknown organic issue is unlikely. Plan to focus on symptom control, adequate nutrition.   -- chronic pain service consulted, appreciated  -- NO CHANGES to pain regimen without multiparty discussion including patient, unless true emergency  -- for pain  -- oxycodone (liquid) 20 mg q4 prn  -- hydromorphone 1 mg IV q4 prn ONLY IF inadequate relief 1 hour after oral alternative  -- buprenorphine 20 mcg/h patch   -- for nausea  -- ondansetron IV q8 scheduled  -- promethazine IV q6 scheduled (not as push)  -- scopolamine patch  -- START olanzapine 5 mg odt daily prn nausea  -- no diphenhydramine IV in absence of clear indication  -- for dyspepsia  -- famotidine bid  -- PPI IV q12  -- for motility  -- naloxegol prn    # Inadequate oral intake  Abnormal gut anatomy notwithstanding, it is the consensus among hospitalist and GI services that oral and/or enteral feeding should be sufficient to meet caloric and hydration needs.   -- TPN will not be offered in forseeable future  -- titrate tube feeds to ~100% USRDI: increase to 50 mL/h   -- appreciate calorie count per nutrition    # Elevated transaminases  Improving: last AST 93, ALT 278. No evidence of cholestasis or synthetic dysfunction. Presume drug-related though that still leaves multiple possible culprits, including the cefdinir she had been taking for pouchitis  -- follow CMP, INR     # Positive blood cx Strep mitis  Strongly suspect contaminant due to suboptimal technique. Repeat cx 03/27/2023 no growth. Has completed appropriate therapy.    # Presyncope  Inadequate hydration, weight loss are plausible causes    # Pouchitis (dx 03/2023)  Ongoing small-volume hematochezia that she attributes to pouchitis based on prior experience. No plan for repeat endoscopy or colonoscopy during this admission, per GI, given recent exam  -- RESUME cefdinir 300 mg bid to complete 4 weeks (end = 04/07/2023) then daily for 4 weeks (end = 05/05/2023)  -- trial liquid formulation; if not tolerated, then tablet    # Desmoid tumors  -- okay to hold nirogesat while having difficulty with oral meds, no urgency to  resume per Dr [name]    # Mood / anxiety  -- continue escitalopram, mirtazipine  -- lorazepam 1 mg bid prn (home medication)    Daily checklist:  VTE prophylaxis: low risk, PADUA score < 4; encourage ambulation  Diet: Nutrition Therapy Regular/House  Osmolite 1.5 (Standard 1.5 Cal) continuous tube feed  Supplement Pediatric; Pediasure (PED Standard); # of Products PER Serving: 1 2xd PC  Code status: Full Code  HCDM:   HCDM ([facility] policy, no known patient preference): Loiselle,[name] - Mother - [phone]    HCDM, back-up (If primary HCDM is unavailable): Magda,Jeff - Father - [phone]    Disposition: Floor status. Discharge goals are (1) meeting 100% of caloric needs via any combination of oral and enteral feeding; (2) off IV medications for symptom control. AIR referrals sent; family would prefer [facility]    PT/OT recommendations: Skilled PT services indicated, 5x weekly, High intensity (03/31/23) / 5x weekly, High intensity (04/02/23)  DME needs: None (03/31/23) / Defer to post acute (04/02/23)    I personally spent 35 minutes face-to-face and non-face-to-face in the care of this patient, which includes all pre, intra, and post visit time on the date of service.  All documented time was specific to the E/M visit and does not include any procedures that may have been performed.    Please page [facility] [phone] with questions. This note reflects active problems and daily updates. Please refer to hospital course for additional details and resolved problems.    ___________________________________________________________________    Subjective:  Difficult night due to nausea, pain, itching related to vancomycin infusion  Finally fell asleep around 0830 and napped most of the morning  No major changes in GI symptoms with last increase in feeds  Had a few bites of bagel yesterday evening, plus up to 2 cartons of pseudomilk  Discussed prior experiences with olanzapine for anxiety and maybe nausea?    Labs/Studies:  Labs and Studies from the last 24hrs per EMR and Reviewed    Objective:  Temp:  [36.6 ??C (97.9 ??F)-36.7 ??C (98.1 ??F)] 36.6 ??C (97.9 ??F)  Heart Rate:  [78-93] 85  Resp:  [18] 18  BP: (95-105)/(55-63) 95/55  SpO2:  [97 %-99 %] 99 %    GEN: not acutely ill appearing young woman sitting up on bed  HEENT: small bore ND tube left nostril  PULM: unlabored in room air  ABD: not distended  EXT: no ankle edema  MSK: apparent loss of bulk in large muscle groups, unchanged  NEURO: alert and oriented to person place time, no focal deficits    LUE PICC present, site clean, dressing intact    ------------  Dolores Patty, MD PhD  [facility]

## 2023-04-02 NOTE — Unmapped (Signed)
Acute Inpatient Rehab referral has been received and patient is currently under review.       Please be advised, [facility] is now located at the [facility].      Visitation Policy:  [facility] allows visitors (6 AM- 9 PM) and one visitor may be designated to stay overnight. The overnight visitor must be in the facility prior to 9 PM.       Donna Christen MS, PT  [facility]  Inpatient Coordinator   Office [phone]   12:46 PM 04/02/2023       Admission Criteria for Acute Inpatient Rehab:     The patient has medical need that will require daily physician oversight.  The patient requires at least two therapy disciplines (PT, OT, ST) at a high frequency.  It is anticipated that the patient will make significant functional improvement in a reasonable length of time.   The patient is able to participate in and tolerate a minimum of three hours of therapy per day.   The patient or his/her representative consents to the admission.  The patient/family has realistic goals that include discharge to a community setting (other than SNF) and has adequate assistance at home.

## 2023-04-02 NOTE — Unmapped (Signed)
Problem: Adult Inpatient Plan of Care  Goal: Plan of Care Review  Outcome: Progressing  Goal: Patient-Specific Goal (Individualized)  Outcome: Progressing  Goal: Absence of Hospital-Acquired Illness or Injury  Outcome: Progressing  Intervention: Identify and Manage Fall Risk  Recent Flowsheet Documentation  Taken 04/02/2023 0000 by Margarita Sermons, RN  Safety Interventions:   fall reduction program maintained   low bed  Taken 04/01/2023 2200 by Margarita Sermons, RN  Safety Interventions:   fall reduction program maintained   low bed  Taken 04/01/2023 2000 by Margarita Sermons, RN  Safety Interventions:   fall reduction program maintained   low bed  Intervention: Prevent Skin Injury  Recent Flowsheet Documentation  Taken 04/02/2023 0000 by Margarita Sermons, RN  Positioning for Skin: Supine/Back  Taken 04/01/2023 2200 by Margarita Sermons, RN  Positioning for Skin: Supine/Back  Taken 04/01/2023 2000 by Margarita Sermons, RN  Positioning for Skin: Supine/Back  Intervention: Prevent Infection  Recent Flowsheet Documentation  Taken 04/02/2023 0000 by Margarita Sermons, RN  Infection Prevention:   environmental surveillance performed   equipment surfaces disinfected   hand hygiene promoted   personal protective equipment utilized   rest/sleep promoted   single patient room provided  Taken 04/01/2023 2200 by Margarita Sermons, RN  Infection Prevention:   environmental surveillance performed   equipment surfaces disinfected   hand hygiene promoted   personal protective equipment utilized   rest/sleep promoted   single patient room provided  Taken 04/01/2023 2000 by Margarita Sermons, RN  Infection Prevention:   environmental surveillance performed   equipment surfaces disinfected   hand hygiene promoted   personal protective equipment utilized   rest/sleep promoted   single patient room provided  Goal: Optimal Comfort and Wellbeing  Outcome: Progressing  Goal: Readiness for Transition of Care  Outcome: Progressing  Goal: Rounds/Family Conference  Outcome: Progressing

## 2023-04-02 NOTE — Unmapped (Signed)
Hospital Medicine Daily Progress Note    Assessment/Plan:    Principal Problem:    Intractable abdominal pain  Active Problems:    [name]    Cancer associated pain    Pain medication agreement signed    Pouchitis (CMS-HCC)    Elevated liver transaminase level    Positive blood culture   Malnutrition Evaluation as performed by RD, LDN: Severe Protein-Calorie Malnutrition in the context of chronic illness (03/27/23 1311)    Please see wound care flowsheets for wound documentation     LOS: 5 days    [name] [name] is a 23 y.o. 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    Overall: caught in difficult cycle of increasing chronic symptom burden, multiple long hospitalizations, deconditioning. Difficult to have continuity of care and maintenance of rapport with transitions in/out of hospital and rotation of providers. We have agreed to prioritize supportive care and nutrition in stepwise fashion.    # Acute on chronic abdominal pain and nausea  Complex situation discussed at length in prior notes. In brief, her pain and nausea are (deeply) multifactorial but consensus among treatment team is that new / unknown organic issue is unlikely. Plan to focus on symptom control, adequate nutrition  -- chronic pain service consulted, appreciated  -- NO CHANGES to pain regimen without multiparty discussion including patient, unless true emergency  -- for pain  -- oxycodone (liquid) 20 mg q4 prn  -- hydromorphone 1 mg IV q4 prn ONLY IF inadequate relief 1 hour after oral alternative  -- buprenorphine 20 mcg/h patch   -- for nausea  -- ondansetron IV q8 scheduled  -- promethazine IV q6 scheduled (not as push)  -- scopolamine patch  -- for dyspepsia  -- famotidine bid  -- CHANGE PPI to IV q12  -- for motility  -- naloxegol prn    # Inadequate oral intake  Abnormal gut anatomy notwithstanding, it is the consensus among hospitalist and GI services that oral and/or enteral feeding should be sufficient to meet caloric and hydration needs.   -- TPN will not be offered in forseeable future  -- titrate tube feeds to ~100% USRDI, slowly and after discussion with patient    # Elevated transaminases  Suspect drug-related (though probably not vancomycin) given multiple recent exposures. Synthetic function preserved  -- follow CMP, INR  -- additional evaluation to include imaging if worsening    # Positive blood cx Strep mitis  Strongly suspect contaminant due to suboptimal technique. Repeat cx 03/27/2023 no growth  -- ID consulted, appreciated  -- FINISH vancomycin (end = 04/01/2023)  -- revisit indication for diphenhydramine once off vancomycin     # Presyncope  Inadequate hydration, weight loss are plausible causes    # Pouchitis (dx 03/2023)  Ongoing small-volume hematochezia that she attributes to pouchitis based on prior experience. No plan for repeat endoscopy or colonoscopy during this admission, per GI, given recent exam  -- resume cefdinir when off IV antibiotics    # Desmoid tumors  -- okay to hold nirogesat while having difficulty with oral meds, no urgency to resume per Dr [name]    # Mood / anxiety  -- continue escitalopram, mirtazipine  -- lorazepam 1 mg bid prn (home medication)    Daily checklist:  VTE prophylaxis: low risk, PADUA score < 4; encourage ambulation  Diet: Nutrition Therapy Regular/House  Osmolite 1.5 (Standard 1.5 Cal) continuous tube feed  Supplement Pediatric; Pediasure (PED Standard); # of Products PER Serving: 1 2xd PC  Code status: Full Code  HCDM:   HCDM ([facility] policy, no known patient preference): Johnson,[name] - Mother - [phone]    HCDM, back-up (If primary HCDM is unavailable): Brittan, [place]augh - Father - [phone]    Disposition: Floor status. Discharge goals are (1) meeting 100% of caloric needs via any combination of oral and enteral feeding; (2) off IV medications for symptom control. She is interested in AIR when medically ready    PT/OT recommendations: Skilled PT services indicated, 5x weekly, High intensity (03/31/23) / 5x weekly, High intensity (03/31/23)  DME needs: None (03/31/23) / Defer to post acute (03/31/23)    I personally spent >70 minutes face-to-face and non-face-to-face in the care of this patient, which includes all pre, intra, and post visit time on the date of service.  All documented time was specific to the E/M visit and does not include any procedures that may have been performed.    Please page [facility] [phone] with questions. This note reflects active problems and daily updates. Please refer to hospital course for additional details and resolved problems.    ___________________________________________________________________    Subjective:  Long discussion with patient and mother of background for current hospitalization  Ongoing nausea, gagging with attempts at oral medications  No meaningful change in abdominal pain  Dyspepsia worse with postpyloric tube in place and up-titration of feeds    Discussed personally with Dr [name] (OUTPATIENT oncologist), Dr Vear Clock (anesthesia attending), Dr Wilson Singer (GI attending), Dr Nehemiah Settle (psychologist) during multidisciplinary meeting today    Labs/Studies:  Labs and Studies from the last 24hrs per EMR and Reviewed    Objective:  Temp:  [36.6 ??C (97.9 ??F)-37.1 ??C (98.8 ??F)] 36.6 ??C (97.9 ??F)  Heart Rate:  [74-87] 78  Resp:  [16-18] 18  BP: (102-110)/(59-71) 105/59  SpO2:  [98 %-99 %] 98 %    GEN: not acutely ill appearing young woman sitting up on bed  PULM: unlabored in room air  ABD: not distended  EXT: no ankle edema  MSK: apparent loss of bulk in large muscle groups  NEURO: alert and oriented to person place time, no focal deficits    ------------  Dolores Patty, MD PhD  [facility] Medicine

## 2023-04-02 NOTE — Unmapped (Signed)
Problem: Adult Inpatient Plan of Care  Goal: Plan of Care Review  Outcome: Ongoing - Unchanged  Goal: Patient-Specific Goal (Individualized)  Outcome: Ongoing - Unchanged  Goal: Absence of Hospital-Acquired Illness or Injury  Outcome: Ongoing - Unchanged  Intervention: Identify and Manage Fall Risk  Recent Flowsheet Documentation  Taken 04/01/2023 1400 by Alcide Evener, RN  Safety Interventions:   fall reduction program maintained   lighting adjusted for tasks/safety   low bed   nonskid shoes/slippers when out of bed  Taken 04/01/2023 1200 by Alcide Evener, RN  Safety Interventions:   fall reduction program maintained   lighting adjusted for tasks/safety   low bed   nonskid shoes/slippers when out of bed  Taken 04/01/2023 1000 by Alcide Evener, RN  Safety Interventions:   fall reduction program maintained   lighting adjusted for tasks/safety   low bed   nonskid shoes/slippers when out of bed  Taken 04/01/2023 0800 by Alcide Evener, RN  Safety Interventions:   fall reduction program maintained   lighting adjusted for tasks/safety   low bed   nonskid shoes/slippers when out of bed  Intervention: Prevent Skin Injury  Recent Flowsheet Documentation  Taken 04/01/2023 1400 by Alcide Evener, RN  Device Skin Pressure Protection: absorbent pad utilized/changed  Skin Protection:   adhesive use limited   incontinence pads utilized  Taken 04/01/2023 1200 by Alcide Evener, RN  Device Skin Pressure Protection: absorbent pad utilized/changed  Skin Protection:   adhesive use limited   incontinence pads utilized  Taken 04/01/2023 1000 by Alcide Evener, RN  Device Skin Pressure Protection: absorbent pad utilized/changed  Skin Protection:   adhesive use limited   incontinence pads utilized  Taken 04/01/2023 0800 by Alcide Evener, RN  Device Skin Pressure Protection: absorbent pad utilized/changed  Skin Protection:   adhesive use limited   incontinence pads utilized  Intervention: Prevent Infection  Recent Flowsheet Documentation  Taken 04/01/2023 1400 by Alcide Evener, RN  Infection Prevention:   hand hygiene promoted   rest/sleep promoted  Taken 04/01/2023 1200 by Alcide Evener, RN  Infection Prevention:   hand hygiene promoted   rest/sleep promoted  Taken 04/01/2023 1000 by Alcide Evener, RN  Infection Prevention:   hand hygiene promoted   rest/sleep promoted  Taken 04/01/2023 0800 by Alcide Evener, RN  Infection Prevention:   hand hygiene promoted   rest/sleep promoted  Goal: Optimal Comfort and Wellbeing  Outcome: Ongoing - Unchanged  Goal: Readiness for Transition of Care  Outcome: Ongoing - Unchanged  Goal: Rounds/Family Conference  Outcome: Ongoing - Unchanged     Problem: Infection  Goal: Absence of Infection Signs and Symptoms  Outcome: Ongoing - Unchanged  Intervention: Prevent or Manage Infection  Recent Flowsheet Documentation  Taken 04/01/2023 1400 by Alcide Evener, RN  Infection Management: aseptic technique maintained  Taken 04/01/2023 1200 by Alcide Evener, RN  Infection Management: aseptic technique maintained  Taken 04/01/2023 1000 by Alcide Evener, RN  Infection Management: aseptic technique maintained  Taken 04/01/2023 0800 by Alcide Evener, RN  Infection Management: aseptic technique maintained     Problem: Latex Allergy  Goal: Absence of Allergy Symptoms  Outcome: Ongoing - Unchanged     Problem: Oncology Care  Goal: Effective Coping  Outcome: Ongoing - Unchanged  Goal: Improved Activity Tolerance  Outcome: Ongoing - Unchanged  Goal: Optimal Oral Intake  Outcome: Ongoing - Unchanged  Goal: Improved Oral Mucous Membrane Integrity  Outcome: Ongoing - Unchanged  Intervention: Promote Oral Comfort and Health  Recent Flowsheet Documentation  Taken  04/01/2023 1400 by Alcide Evener, RN  Oral Mucous Membrane Protection:   nonirritating oral fluids promoted   nonirritating oral foods promoted  Taken 04/01/2023 1200 by Alcide Evener, RN  Oral Mucous Membrane Protection:   nonirritating oral fluids promoted   nonirritating oral foods promoted  Taken 04/01/2023 1000 by Alcide Evener, RN  Oral Mucous Membrane Protection:   nonirritating oral fluids promoted   nonirritating oral foods promoted  Taken 04/01/2023 0800 by Alcide Evener, RN  Oral Mucous Membrane Protection:   nonirritating oral fluids promoted   nonirritating oral foods promoted  Goal: Optimal Pain Control and Function  Outcome: Ongoing - Unchanged     Problem: Self-Care Deficit  Goal: Improved Ability to Complete Activities of Daily Living  Outcome: Ongoing - Unchanged     Problem: Fall Injury Risk  Goal: Absence of Fall and Fall-Related Injury  Outcome: Ongoing - Unchanged  Intervention: Promote Injury-Free Environment  Recent Flowsheet Documentation  Taken 04/01/2023 1400 by Alcide Evener, RN  Safety Interventions:   fall reduction program maintained   lighting adjusted for tasks/safety   low bed   nonskid shoes/slippers when out of bed  Taken 04/01/2023 1200 by Alcide Evener, RN  Safety Interventions:   fall reduction program maintained   lighting adjusted for tasks/safety   low bed   nonskid shoes/slippers when out of bed  Taken 04/01/2023 1000 by Alcide Evener, RN  Safety Interventions:   fall reduction program maintained   lighting adjusted for tasks/safety   low bed   nonskid shoes/slippers when out of bed  Taken 04/01/2023 0800 by Alcide Evener, RN  Safety Interventions:   fall reduction program maintained   lighting adjusted for tasks/safety   low bed   nonskid shoes/slippers when out of bed     Problem: Skin Injury Risk Increased  Goal: Skin Health and Integrity  Outcome: Ongoing - Unchanged  Intervention: Optimize Skin Protection  Recent Flowsheet Documentation  Taken 04/01/2023 1400 by Alcide Evener, RN  Pressure Reduction Techniques:   frequent weight shift encouraged   heels elevated off bed  Pressure Reduction Devices: pressure-redistributing mattress utilized  Skin Protection:   adhesive use limited   incontinence pads utilized  Taken 04/01/2023 1200 by Alcide Evener, RN  Pressure Reduction Techniques:   frequent weight shift encouraged   heels elevated off bed  Pressure Reduction Devices: pressure-redistributing mattress utilized  Skin Protection:   adhesive use limited   incontinence pads utilized  Taken 04/01/2023 1000 by Alcide Evener, RN  Pressure Reduction Techniques:   frequent weight shift encouraged   heels elevated off bed  Pressure Reduction Devices: pressure-redistributing mattress utilized  Skin Protection:   adhesive use limited   incontinence pads utilized  Taken 04/01/2023 0800 by Alcide Evener, RN  Pressure Reduction Techniques:   frequent weight shift encouraged   heels elevated off bed  Pressure Reduction Devices: pressure-redistributing mattress utilized  Skin Protection:   adhesive use limited   incontinence pads utilized     Problem: Pain Acute  Goal: Optimal Pain Control and Function  Outcome: Ongoing - Unchanged

## 2023-04-03 DIAGNOSIS — D48119 Desmoid tumor: Principal | ICD-10-CM

## 2023-04-03 LAB — COMPREHENSIVE METABOLIC PANEL
ALBUMIN: 3.1 g/dL — ABNORMAL LOW (ref 3.4–5.0)
ALKALINE PHOSPHATASE: 144 U/L — ABNORMAL HIGH (ref 46–116)
ALT (SGPT): 171 U/L — ABNORMAL HIGH (ref 10–49)
ANION GAP: 10 mmol/L (ref 5–14)
AST (SGOT): 31 U/L (ref <=34)
BILIRUBIN TOTAL: 0.3 mg/dL (ref 0.3–1.2)
BLOOD UREA NITROGEN: 10 mg/dL (ref 9–23)
BUN / CREAT RATIO: 21
CALCIUM: 9.3 mg/dL (ref 8.7–10.4)
CHLORIDE: 102 mmol/L (ref 98–107)
CO2: 32 mmol/L — ABNORMAL HIGH (ref 20.0–31.0)
CREATININE: 0.48 mg/dL — ABNORMAL LOW (ref 0.55–1.02)
EGFR CKD-EPI (2021) FEMALE: >90 mL/min/{1.73_m2} (ref >=60)
GLUCOSE RANDOM: 113 mg/dL (ref 70–179)
POTASSIUM: 3.7 mmol/L (ref 3.4–4.8)
PROTEIN TOTAL: 5.8 g/dL (ref 5.7–8.2)
SODIUM: 144 mmol/L (ref 135–145)

## 2023-04-03 LAB — PHOSPHORUS: PHOSPHORUS: 4.4 mg/dL (ref 2.4–5.1)

## 2023-04-03 LAB — MAGNESIUM: MAGNESIUM: 2.1 mg/dL (ref 1.6–2.6)

## 2023-04-03 LAB — PROTIME-INR
INR: 1.1
PROTIME: 12.5 s (ref 9.9–12.6)

## 2023-04-03 MED ORDER — NIROGACESTAT 150 MG TABLET
ORAL_TABLET | Freq: Two times a day (BID) | ORAL | 11 refills | 30.00 days | Status: CP
Start: 2023-04-03 — End: ?

## 2023-04-03 MED ADMIN — oxyCODONE (ROXICODONE) 5 mg/5 mL solution 20 mg: 20 mg | ORAL | @ 17:00:00 | Stop: 2023-04-09

## 2023-04-03 MED ADMIN — HYDROmorphone (PF) (DILAUDID) injection 1 mg: 1 mg | INTRAVENOUS | @ 14:00:00 | Stop: 2023-04-11

## 2023-04-03 MED ADMIN — promethazine (PHENERGAN) 12.5 mg in sodium chloride (NS) 0.9 % 50 mL IVPB: 12.5 mg | INTRAVENOUS | @ 02:00:00

## 2023-04-03 MED ADMIN — sodium chloride (NS) 0.9 % flush 10 mL: 10 mL | INTRAVENOUS | @ 19:00:00

## 2023-04-03 MED ADMIN — pantoprazole (Protonix) injection 40 mg: 40 mg | INTRAVENOUS | @ 02:00:00

## 2023-04-03 MED ADMIN — HYDROmorphone (PF) (DILAUDID) injection 1 mg: 1 mg | INTRAVENOUS | @ 18:00:00 | Stop: 2023-04-11

## 2023-04-03 MED ADMIN — pantoprazole (Protonix) injection 40 mg: 40 mg | INTRAVENOUS | @ 14:00:00

## 2023-04-03 MED ADMIN — oxyCODONE (ROXICODONE) 5 mg/5 mL solution 20 mg: 20 mg | ORAL | @ 02:00:00 | Stop: 2023-04-09

## 2023-04-03 MED ADMIN — promethazine (PHENERGAN) 12.5 mg in sodium chloride (NS) 0.9 % 50 mL IVPB: 12.5 mg | INTRAVENOUS | @ 22:00:00

## 2023-04-03 MED ADMIN — HYDROmorphone (PF) (DILAUDID) injection 1 mg: 1 mg | INTRAVENOUS | @ 22:00:00 | Stop: 2023-04-11

## 2023-04-03 MED ADMIN — ondansetron (ZOFRAN) injection 8 mg: 8 mg | INTRAVENOUS | @ 03:00:00

## 2023-04-03 MED ADMIN — sodium chloride (NS) 0.9 % flush 10 mL: 10 mL | INTRAVENOUS | @ 11:00:00

## 2023-04-03 MED ADMIN — ondansetron (ZOFRAN) injection 8 mg: 8 mg | INTRAVENOUS | @ 11:00:00

## 2023-04-03 MED ADMIN — oxyCODONE (ROXICODONE) 5 mg/5 mL solution 20 mg: 20 mg | ORAL | @ 13:00:00 | Stop: 2023-04-09

## 2023-04-03 MED ADMIN — escitalopram (LEXAPRO) oral solution: 5 mg | ENTERAL | @ 16:00:00

## 2023-04-03 MED ADMIN — oxyCODONE (ROXICODONE) 5 mg/5 mL solution 20 mg: 20 mg | ORAL | @ 21:00:00 | Stop: 2023-04-09

## 2023-04-03 MED ADMIN — HYDROmorphone (PF) (DILAUDID) injection 1 mg: 1 mg | INTRAVENOUS | @ 09:00:00 | Stop: 2023-04-11

## 2023-04-03 MED ADMIN — promethazine (PHENERGAN) 12.5 mg in sodium chloride (NS) 0.9 % 50 mL IVPB: 12.5 mg | INTRAVENOUS | @ 16:00:00

## 2023-04-03 MED ADMIN — sodium chloride (NS) 0.9 % flush 10 mL: 10 mL | INTRAVENOUS | @ 03:00:00

## 2023-04-03 MED ADMIN — ammonium lactate (LAC-HYDRIN) 12 % lotion 1 Application: 1 | TOPICAL | @ 02:00:00

## 2023-04-03 MED ADMIN — cefdinir (OMNICEF) oral suspension: 300 mg | ORAL | @ 14:00:00 | Stop: 2023-04-08

## 2023-04-03 MED ADMIN — acetaminophen (TYLENOL) oral liquid: 1000 mg | ENTERAL | @ 11:00:00

## 2023-04-03 MED ADMIN — acetaminophen (TYLENOL) oral liquid: 1000 mg | ENTERAL | @ 18:00:00

## 2023-04-03 MED ADMIN — lidocaine (ASPERCREME) 4 % 1 patch: 1 | TRANSDERMAL | @ 14:00:00

## 2023-04-03 MED ADMIN — ondansetron (ZOFRAN) injection 8 mg: 8 mg | INTRAVENOUS | @ 18:00:00

## 2023-04-03 MED ADMIN — HYDROmorphone (PF) (DILAUDID) injection 1 mg: 1 mg | INTRAVENOUS | @ 03:00:00 | Stop: 2023-04-11

## 2023-04-03 MED ADMIN — promethazine (PHENERGAN) 12.5 mg in sodium chloride (NS) 0.9 % 50 mL IVPB: 12.5 mg | INTRAVENOUS | @ 09:00:00

## 2023-04-03 NOTE — Unmapped (Signed)
Department of Anesthesiology  Pain Medicine Division    Chronic Pain inpatient Consult follow up Note      Requesting Attending Physician:  Dolores Patty, MD  Service Requesting Consult:  [facility])    Assessment/Recommendations:  The patient was seen in consultation on request of Dolores Patty, MD regarding assistance with pain management. The patient is not obtaining adequate pain relief on current medication regimen.     [name] [name] is a 23 year old female with a complex medical history, including DVT (RUE), [name] syndrome (post-colectomy), desmoid tumors, anemia, and severe protein-calorie malnutrition. She is followed at [facility] for chronic abdominal and right shoulder pain, consistent with cancer-associated pain. 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 Carlisa???s pain has worsened in recent months, likely due to intermittent intestinal obstructions related to her mesenteric desmoid tumors, which may cause intermittent bowel intussusception. Her pain is further complicated by narcotic bowel syndrome, visceral hyperalgesia, and a positive feedback loop where increased opioid use exacerbates the pain. Anner???s previous hospitalization was complicated by ileus, requiring NG tube, a prolonged inpatient rehab stay.  During her most recent hospitalization she was found to have some pouchitis and was continued on oral antibiotic.      Interval: NAEO. Sitting in bed comfortably with family at bedside. Pain is steady. Reports vomited episode overnight. Patient continues to tolerate gradual increases in TF well and is slowly increasing her PO intake as well. Worked with PT/OT yesterday. Agreeable with plan to continue pain regimen with no changes today.        Recommendations:  -The chronic pain service is a consult service and does not place orders, just makes recommendations (except ketamine and lidocaine infusions)  -Please evaluate all patients on opioids for appropriateness of prescribing narcan at discharge.  The chronic pain service can assist with this.  Nasal narcan covered by most insurance.  -Recommendations given apply to the current hospitalization and do not reflect long term recommendations.     Recommended to   -Continue Butrans patch 20 mcg/h q. Weekly  -Aggressively address nausea symptoms per primary team choice  -Continue to support patient from multidisciplinary approach   -Continue oxycodone liquid 20 mg every 4 hours as needed (1st line therapy for severe pain), please encourage patient to utilize this modality, unless she has intractable vomiting despite antiemetic  -Continue hydromorphone 1 mg IV every 4 hours as needed (2nd line therapy for severe pain). (2nd line)  -Continue Tylenol 1 g 3 times daily    We will continue to follow her. Chronic pain service will be available to contact via pager.     Please prescribe naloxone at discharge.  Nasal narcan for most insured (Nasal narcan 4mg /actuation, prescribe 1 kit, instructions at [email or web address]).  For uninsured, chronic pain can work to assist in finding an option.  OTC nasal narcan now available at most pharmacies for around $45.    Allergies    Allergies   Allergen Reactions    Adhesive Tape-Silicones Hives and Rash     Paper tape and Tegederm ok. Biopatch causes blistering but has tolerated CHG Tegaderm    Ferrlecit [Sodium Ferric Gluconat-Sucrose] Swelling and Rash    Levofloxacin Swelling and Rash     Swelling in mouth, rash,     Methylnaltrexone      Per Patient: I lost bowel control, severe abdominal cramping, and elevated BP  Neomycin Swelling     Rxn after ear drops; ear swelling    Papaya Hives    Morphine Nausea And Vomiting    Zosyn [Piperacillin-Tazobactam] Itching and Rash     Red and itchy    Compazine [Prochlorperazine] Other (See Comments)     Extreme agitation    Iron Analogues     Reglan [Metoclopramide Hcl] Diarrhea    Iron Dextran Itching     Received iron dextran 06/08/22 over 12 hours, had itching and redness/flushing during the infusion and for a couple days after. Required IV benadryl w/flares between doses and ultimately treated w/IV methylpred for 2 days.     Latex, Natural Rubber Rash       Home Medications    Medications Prior to Admission   Medication Sig Dispense Refill Last Dose/Taking    acetaminophen (TYLENOL) 500 MG tablet Take 2 tablets (1,000 mg total) by mouth every eight (8) hours.       buprenorphine (BUTRANS) 20 mcg/hour PTWK transdermal patch Place 1 patch on the skin every seven (7) days. 4 patch 1     cefdinir (OMNICEF) 300 MG capsule Take 1 capsule (300 mg total) by mouth every twelve (12) hours for 19 days, THEN 1 capsule (300 mg total) daily for 28 days. 66 capsule 0     COURIERED MED OR SUPPLY OGSVEO 50 MG TABLET. 1 each 0     escitalopram oxalate (LEXAPRO) 5 MG tablet Take 1 tablet (5 mg total) by mouth daily. 30 tablet 0     famotidine (PEPCID) 20 MG tablet Take 1 tablet (20 mg total) by mouth nightly. 30 tablet 3     fluticasone propionate (FLONASE) 50 mcg/actuation nasal spray Use 1 spray under the butrans patch once every 7 days to prevent allergic reaction to the adhesive       hydrocortisone 2.5 % cream Apply topically two (2) times a day as needed (hemorrhoids). 30 g 0     lidocaine (ASPERCREME) 4 % patch Place 1 patch on the skin daily as needed.       LORazepam (ATIVAN) 1 MG tablet Take 1 tablet (1 mg total) by mouth two (2) times a day as needed for anxiety. 60 tablet 0     naloxegol (MOVANTIK) 12.5 mg tablet Take 1 tablet (12.5 mg total) by mouth daily. (Patient taking differently: Take 1 tablet (12.5 mg total) by mouth daily as needed.) 30 tablet 0     naloxone (NARCAN) 4 mg nasal spray One spray in either nostril once for known/suspected opioid overdose. May repeat every 2-3 minutes in alternating nostril til EMS arrives 2 each 0 nirogacestat (OGSIVEO) 50 mg tablet Take 3 tablets (150 mg total) by mouth two (2) times a day. Swallow tablets whole; do not break, crush, or chew. 180 tablet 5     ondansetron (ZOFRAN-ODT) 8 MG disintegrating tablet Dissolve 1 tablet (8 mg total) in the mouth every eight (8) hours as needed for nausea. 90 tablet 0     [EXPIRED] oxyCODONE (ROXICODONE) 5 mg/5 mL solution Take 20 mL (20 mg total) by mouth every four (4) hours as needed for up to 7 days. 840 mL 0     pantoprazole (PROTONIX) 40 MG tablet Take 1 tablet (40 mg total) by mouth daily before breakfast. 30 tablet 0     promethazine (PHENERGAN) 12.5 MG tablet Take 1 tablet (12.5 mg total) by mouth every twelve (12) hours as needed for nausea. 14 tablet 0  scopolamine (TRANSDERM-SCOP) 1 mg over 3 days Place 1 patch (1 mg total) on the skin every third day. 10 patch 2     sucralfate (CARAFATE) 100 mg/mL suspension Take 10 mL (1 g total) by mouth four (4) times a day as needed.       zinc oxide-cod liver oil (DESITIN 40%) 40 % Pste Apply topically daily as needed. 57 g 0        Inpatient Medications  Current Facility-Administered Medications   Medication Dose Route Frequency Provider Last Rate Last Admin    acetaminophen (TYLENOL) oral liquid  1,000 mg Enteral tube: post-pyloric (duodenum, jejunum) Q8H SCH [name], MD   1,000 mg at 04/03/23 0554    ammonium lactate (LAC-HYDRIN) 12 % lotion 1 Application  1 Application Topical BID [name], MD   1 Application at 04/02/23 2051    buprenorphine 20 mcg/hour transdermal patch 1 patch  1 patch Transdermal Weekly Dholakia, Vibhaben, AGNP   1 patch at 04/01/23 1011    cefdinir (OMNICEF) oral suspension  300 mg Oral BID Dolores Patty, MD        Followed by    Melene Muller ON 04/08/2023] cefdinir (OMNICEF) oral suspension  300 mg Oral Daily Dolores Patty, MD        cetirizine (ZYRTEC) oral syrup  20 mg Enteral tube: post-pyloric (duodenum, jejunum) BID [name], MD   20 mg at 04/02/23 1001    [Provider Hold] cholecalciferol (vitamin D3 25 mcg (1,000 units)) tablet 25 mcg  25 mcg Oral Daily Dholakia, Vibhaben, AGNP        escitalopram (LEXAPRO) oral solution  5 mg Enteral tube: post-pyloric (duodenum, jejunum) Daily [name], MD   5 mg at 04/02/23 1001    famotidine (PEPCID) oral suspension  20 mg Enteral tube: post-pyloric (duodenum, jejunum) Nightly [name], MD   20 mg at 03/31/23 2141    fluticasone propionate (FLONASE) 50 mcg/actuation nasal spray 1 spray  1 spray Topical Weekly Dholakia, Vibhaben, AGNP   1 spray at 04/01/23 1011    hydrocortisone 2.5 % cream   Topical BID PRN Dholakia, Vibhaben, AGNP        HYDROmorphone (PF) (DILAUDID) injection 1 mg  1 mg Intravenous Q4H PRN [name], MD   1 mg at 04/03/23 0352    IP OKAY TO TREAT   Other Continuous PRN Dholakia, Vibhaben, AGNP        IP OKAY TO TREAT   Other Continuous PRN Dholakia, Vibhaben, AGNP        IP OKAY TO TREAT   Other Continuous PRN Dholakia, Vibhaben, AGNP        lidocaine (ASPERCREME) 4 % 1 patch  1 patch Transdermal Daily Dholakia, Vibhaben, AGNP   1 patch at 04/02/23 0952    LORazepam (ATIVAN) tablet 1 mg  1 mg Oral BID PRN Dholakia, Vibhaben, AGNP   1 mg at 03/29/23 1243    melatonin tablet 3 mg  3 mg Oral Nightly PRN [name], MD        mirtazapine (REMERON) tablet 7.5 mg  7.5 mg Oral Nightly [name], MD        multivitamin with folic acid 400 mcg tablet 1 tablet  1 tablet Oral Daily [name], MD        naloxegol (MOVANTIK) 12.5 mg tablet 12.5 mg  12.5 mg Oral Daily PRN Dholakia, Vibhaben, AGNP        [Provider  Hold] nirogacestat (OGSIVEO) tablet 150 mg  150 mg Oral BID Dholakia, Vibhaben, AGNP        OLANZapine zydis (ZYPREXA) disintegrating tablet 5 mg  5 mg Oral Daily PRN Dolores Patty, MD        ondansetron [facility]) injection 8 mg  8 mg Intravenous Q8H Dholakia, Vibhaben, AGNP   8 mg at 04/03/23 0553    oxyCODONE (ROXICODONE) 5 mg/5 mL solution 20 mg  20 mg Oral Q4H PRN [name], MD   20 mg at 04/03/23 0815    pantoprazole (Protonix) injection 40 mg  40 mg Intravenous BID Dolores Patty, MD   40 mg at 04/02/23 2051    promethazine (PHENERGAN) 12.5 mg in sodium chloride (NS) 0.9 % 50 mL IVPB  12.5 mg Intravenous Q6H [name], MD   Stopped at 04/03/23 0430    scopolamine (TRANSDERM-SCOP) 1 mg over 3 days topical patch 1 mg  1 patch Topical Q72H Dholakia, Vibhaben, AGNP   1 mg at 04/01/23 0951    sodium chloride (NS) 0.9 % flush 10 mL  10 mL Intravenous Q8H [name], MD   10 mL at 04/03/23 1610    [Provider Hold] thiamine mononitrate (vit B1) tablet 200 mg  200 mg Oral Daily [name], MD             Past Medical History    Past Medical History:   Diagnosis Date    Abdominal pain     Acid reflux     occas    Anesthesia complication     itching, shaking, coldness; last few surgeries have gone much better    Cancer (CMS-HCC)     Cataract of right eye     COVID-19 virus infection 01/2019    Cyst of thyroid determined by ultrasound     monitoring    [name]     2 right forearm, 1 left thigh, 1 right scapula, 1 under left clavicle; multiple    Difficult intravenous access     [name] (familial adenomatous polyposis)     [name]     Gastric polyps     History of chemotherapy     last treatment approx 05/2019    History of colon polyps     History of COVID-19 01/2019    Ileus (CMS-HCC) 03/16/2022    Iron deficiency anemia due to chronic blood loss     received iron infusion 11-2019    PONV (postoperative nausea and vomiting)     Rectal bleeding     Syncopal episodes     especially if becoming dehydrated           Past Surgical History    Past Surgical History:   Procedure Laterality Date    COLON SURGERY      cyroablation      cystis removal      desmoid removal      PR CLOSE ENTEROSTOMY,RESEC+ANAST N/A 10/09/2020    Procedure: ILEOSTOMY TAKEDOWN;  Surgeon: Mickle Asper, MD;  Location: OR [place];  Service: General Surgery    PR COLONOSCOPY W/BIOPSY SINGLE/MULTIPLE N/A 10/27/2012    Procedure: COLONOSCOPY, FLEXIBLE, PROXIMAL TO SPLENIC FLEXURE; WITH BIOPSY, SINGLE OR MULTIPLE;  Surgeon: Shirlyn Goltz Mir, MD;  Location: PEDS PROCEDURE ROOM [facility];  Service: Gastroenterology    PR COLONOSCOPY W/BIOPSY SINGLE/MULTIPLE N/A 09/14/2013    Procedure: COLONOSCOPY, FLEXIBLE, PROXIMAL TO SPLENIC FLEXURE; WITH BIOPSY, SINGLE OR MULTIPLE;  Surgeon: Shirlyn Goltz Mir, MD;  Location: PEDS PROCEDURE ROOM [facility];  Service: Gastroenterology    PR COLONOSCOPY W/BIOPSY SINGLE/MULTIPLE N/A 11/08/2014    Procedure: COLONOSCOPY, FLEXIBLE, PROXIMAL TO SPLENIC FLEXURE; WITH BIOPSY, SINGLE OR MULTIPLE;  Surgeon: Arnold Long Mir, MD;  Location: PEDS PROCEDURE ROOM [facility];  Service: Gastroenterology    PR COLONOSCOPY W/BIOPSY SINGLE/MULTIPLE N/A 12/26/2015    Procedure: COLONOSCOPY, FLEXIBLE, PROXIMAL TO SPLENIC FLEXURE; WITH BIOPSY, SINGLE OR MULTIPLE;  Surgeon: Arnold Long Mir, MD;  Location: PEDS PROCEDURE ROOM [facility];  Service: Gastroenterology    PR COLONOSCOPY W/BIOPSY SINGLE/MULTIPLE N/A 09/02/2017    Procedure: COLONOSCOPY, FLEXIBLE, PROXIMAL TO SPLENIC FLEXURE; WITH BIOPSY, SINGLE OR MULTIPLE;  Surgeon: Arnold Long Mir, MD;  Location: PEDS PROCEDURE ROOM [place];  Service: Gastroenterology    PR COLSC FLX W/REMOVAL LESION BY HOT BX FORCEPS N/A 08/27/2016    Procedure: COLONOSCOPY, FLEXIBLE, PROXIMAL TO SPLENIC FLEXURE; W/REMOVAL TUMOR/POLYP/OTHER LESION, HOT BX FORCEP/CAUTE;  Surgeon: Arnold Long Mir, MD;  Location: PEDS PROCEDURE ROOM [facility];  Service: Gastroenterology    PR COLSC FLX W/RMVL OF TUMOR POLYP LESION SNARE TQ N/A 02/25/2019    Procedure: COLONOSCOPY FLEX; W/REMOV TUMOR/LES BY SNARE;  Surgeon: Helyn Numbers, MD;  Location: GI PROCEDURES [place] [facility];  Service: Gastroenterology    PR COLSC FLX W/RMVL OF TUMOR POLYP LESION SNARE TQ N/A 03/13/2020    Procedure: COLONOSCOPY FLEX; W/REMOV TUMOR/LES BY SNARE;  Surgeon: Helyn Numbers, MD;  Location: [facility] PROCEDURES [place] [facility];  Service: Gastroenterology    PR EXC SKIN BENIG 2.1-3 CM TRUNK,ARM,LEG Right 02/25/2017    Procedure: EXCISION, BENIGN LESION INCLUDE MARGINS, EXCEPT SKIN TAG, LEGS; EXCISED DIAMETER 2.1 TO 3.0 CM;  Surgeon: Clarene Duke, MD;  Location: CHILDRENS OR [facility];  Service: Plastics    PR EXC SKIN BENIG 3.1-4 CM TRUNK,ARM,LEG Right 02/25/2017    Procedure: EXCISION, BENIGN LESION INCLUDE MARGINS, EXCEPT SKIN TAG, ARMS; EXCISED DIAMETER 3.1 TO 4.0 CM;  Surgeon: Clarene Duke, MD;  Location: CHILDRENS OR [facility];  Service: Plastics    PR EXC SKIN BENIG >4 CM FACE,FACIAL Right 02/25/2017    Procedure: EXCISION, OTHER BENIGN LES INCLUD MARGIN, FACE/EARS/EYELIDS/NOSE/LIPS/MUCOUS MEMBRANE; EXCISED DIAM >4.0 CM;  Surgeon: Clarene Duke, MD;  Location: CHILDRENS OR [facility];  Service: Plastics    PR EXC TUMOR SOFT TISSUE LEG/ANKLE SUBQ 3+CM Right 08/05/2019    Procedure: EXCISION, TUMOR, SOFT TISSUE OF LEG OR ANKLE AREA, SUBCUTANEOUS; 3 CM OR GREATER;  Surgeon: Arsenio Katz, MD;  Location: MAIN OR [place];  Service: Plastics    PR EXC TUMOR SOFT TISSUE LEG/ANKLE SUBQ <3CM Right 08/05/2019    Procedure: EXCISION, TUMOR, SOFT TISSUE OF LEG OR ANKLE AREA, SUBCUTANEOUS; LESS THAN 3 CM;  Surgeon: Arsenio Katz, MD;  Location: MAIN OR [facility];  Service: Plastics    PR LAP, SURG PROCTECTOMY W J-POUCH N/A 08/10/2020    Procedure: ROBOTIC ASSISTED LAPAROSCOPIC PROCTOCOLECTOMY, ILEAL J POUCH, WITH OSTOMY;  Surgeon: Mickle Asper, MD;  Location: OR [place];  Service: General Surgery    PR NDSC EVAL INTSTINAL POUCH DX W/COLLJ SPEC SPX N/A 01/23/2021    Procedure: ENDO EVAL SM INTEST POUCH; DX;  Surgeon: Modena Nunnery, MD;  Location: [facility] PROCEDURES [place] [facility];  Service: Gastroenterology    PR NDSC EVAL INTSTINAL POUCH DX W/COLLJ SPEC SPX N/A 08/27/2021    Procedure: ENDO EVAL SM INTEST POUCH; DX;  Surgeon: Hunt Oris, MD;  Location: GI PROCEDURES [facility];  Service: Gastroenterology    PR NDSC EVAL INTSTINAL POUCH DX W/COLLJ SPEC SPX  N/A 12/09/2021    Procedure: ENDO EVAL SM INTEST POUCH; DX;  Surgeon: Vidal Schwalbe, MD;  Location: GI PROCEDURES [facility];  Service: Gastroenterology    PR NDSC EVAL INTSTINAL POUCH DX W/COLLJ [facility] SPX Left 04/09/2022    Procedure: ENDO EVAL SM INTEST POUCH; DX;  Surgeon: Modena Nunnery, MD;  Location: [facility] PROCEDURES [place] [facility];  Service: Gastroenterology    PR NDSC EVAL INTSTINAL POUCH DX W/COLLJ SPEC SPX N/A 08/05/2022    Procedure: ENDO EVAL SM INTEST POUCH; DX;  Surgeon: Modena Nunnery, MD;  Location: GI PROCEDURES [facility];  Service: Gastroenterology    PR NDSC EVAL INTSTINAL POUCH DX W/COLLJ SPEC SPX N/A 03/13/2023    Procedure: ENDO EVAL SM INTEST POUCH; DX;  Surgeon: Carmon Ginsberg, MD;  Location: GI PROCEDURES [facility];  Service: Gastroenterology    PR NDSC EVAL INTSTINAL POUCH W/BX SINGLE/MULTIPLE N/A 01/20/2022    Procedure: ENDOSCOPIC EVAL OF SMALL INTESTINAL POUCH; DIAGNOSTIC, No biopsies;  Surgeon: Andrey Farmer, MD;  Location: GI PROCEDURES [facility];  Service: Gastroenterology    PR NDSC EVAL INTSTINAL POUCH W/BX SINGLE/MULTIPLE N/A 02/13/2022    Procedure: ENDOSCOPIC EVAL OF SMALL INTESTINAL POUCH; DIAGNOSTIC, WITH BIOPSY;  Surgeon: Bronson Curb, MD;  Location: GI PROCEDURES [facility];  Service: Gastroenterology    PR NDSC EVAL INTSTINAL POUCH W/BX SINGLE/MULTIPLE N/A 03/13/2023    Procedure: ENDOSCOPIC EVAL OF SMALL INTESTINAL POUCH; DIAGNOSTIC, WITH BIOPSY;  Surgeon: Carmon Ginsberg, MD;  Location: GI PROCEDURES [facility];  Service: Gastroenterology    PR UNLISTED PROCEDURE SMALL INTESTINE  01/23/2021    Procedure: UNLISTED PROCEDURE, SMALL INTESTINE;  Surgeon: Modena Nunnery, MD;  Location: [facility] PROCEDURES [place] [facility];  Service: Gastroenterology    PR UNLISTED PROCEDURE SMALL INTESTINE  02/13/2022 Procedure: UNLISTED PROCEDURE, SMALL INTESTINE;  Surgeon: Bronson Curb, MD;  Location: GI PROCEDURES [facility];  Service: Gastroenterology    PR UPPER GI ENDOSCOPY,BIOPSY N/A 10/27/2012    Procedure: UGI ENDOSCOPY; WITH BIOPSY, SINGLE OR MULTIPLE;  Surgeon: Shirlyn Goltz Mir, MD;  Location: PEDS PROCEDURE ROOM [facility];  Service: Gastroenterology    PR UPPER GI ENDOSCOPY,BIOPSY N/A 09/14/2013    Procedure: UGI ENDOSCOPY; WITH BIOPSY, SINGLE OR MULTIPLE;  Surgeon: Shirlyn Goltz Mir, MD;  Location: PEDS PROCEDURE ROOM [facility];  Service: Gastroenterology    PR UPPER GI ENDOSCOPY,BIOPSY N/A 11/08/2014    Procedure: UGI ENDOSCOPY; WITH BIOPSY, SINGLE OR MULTIPLE;  Surgeon: Arnold Long Mir, MD;  Location: PEDS PROCEDURE ROOM [facility];  Service: Gastroenterology    PR UPPER GI ENDOSCOPY,BIOPSY N/A 12/26/2015    Procedure: UGI ENDOSCOPY; WITH BIOPSY, SINGLE OR MULTIPLE;  Surgeon: Arnold Long Mir, MD;  Location: PEDS PROCEDURE ROOM [facility];  Service: Gastroenterology    PR UPPER GI ENDOSCOPY,BIOPSY N/A 08/27/2016    Procedure: UGI ENDOSCOPY; WITH BIOPSY, SINGLE OR MULTIPLE;  Surgeon: Arnold Long Mir, MD;  Location: PEDS PROCEDURE ROOM [facility];  Service: Gastroenterology    PR UPPER GI ENDOSCOPY,BIOPSY N/A 09/02/2017    Procedure: UGI ENDOSCOPY; WITH BIOPSY, SINGLE OR MULTIPLE;  Surgeon: Arnold Long Mir, MD;  Location: PEDS PROCEDURE ROOM [facility];  Service: Gastroenterology    PR UPPER GI ENDOSCOPY,BIOPSY N/A 03/13/2020    Procedure: UGI ENDOSCOPY; WITH BIOPSY, SINGLE OR MULTIPLE;  Surgeon: Helyn Numbers, MD;  Location: GI PROCEDURES [place] [facility];  Service: Gastroenterology    PR UPPER GI ENDOSCOPY,BIOPSY N/A 09/05/2021    Procedure: UGI ENDOSCOPY; WITH BIOPSY, SINGLE OR MULTIPLE;  Surgeon: Wendall Papa, MD;  Location: GI  PROCEDURES [facility];  Service: Gastroenterology    PR UPPER GI ENDOSCOPY,DIAGNOSIS N/A 01/20/2022    Procedure: UGI ENDO, INCLUDE ESOPHAGUS, STOMACH, & DUODENUM &/OR JEJUNUM; DX W/WO COLLECTION SPECIMN, BY BRUSH OR WASH;  Surgeon: Andrey Farmer, MD;  Location: GI PROCEDURES [facility];  Service: Gastroenterology    TUMOR REMOVAL      multiple-head, neck, back, hand, right flank, multiple           Family History    Family History   Problem Relation Age of Onset    No Known Problems Mother     No Known Problems Father     No Known Problems Sister     No Known Problems Brother     Stroke Maternal Grandmother     Other Maternal Grandmother         benign lesions of liver and pancreas, further details unknown    Cancer Maternal Grandmother     Diabetes Maternal Grandmother     Hypertension Maternal Grandmother     Thyroid disease Maternal Grandmother     Arthritis Maternal Grandfather     Asthma Maternal Grandfather     COPD Paternal Grandmother         Deceased    Miscarriages / Stillbirths Paternal Grandmother     Alcohol abuse Paternal Grandfather         Deceased    No Known Problems Maternal Aunt     No Known Problems Maternal Uncle     No Known Problems Paternal Aunt     No Known Problems Paternal Uncle     Anesthesia problems Neg Hx     Broken bones Neg Hx     Cancer Neg Hx     Clotting disorder Neg Hx     Collagen disease Neg Hx     Diabetes Neg Hx     Dislocations Neg Hx     Fibromyalgia Neg Hx     Gout Neg Hx     Hemophilia Neg Hx     Osteoporosis Neg Hx     Rheumatologic disease Neg Hx     Scoliosis Neg Hx     Severe sprains Neg Hx     Sickle cell anemia Neg Hx     Spinal Compression Fracture Neg Hx     Melanoma Neg Hx     Basal cell carcinoma Neg Hx     Squamous cell carcinoma Neg Hx          Social History:  Social History     Tobacco Use   Smoking Status Never    Passive exposure: Past   Smokeless Tobacco Never     Social History     Substance and Sexual Activity   Alcohol Use Never     Social History     Substance and Sexual Activity   Drug Use Never         Lab Results   Component Value Date    CREATININE 0.48 (L) 04/03/2023       Lab Results   Component Value Date    ALKPHOS 144 (H) 04/03/2023 BILITOT 0.3 04/03/2023    BILIDIR <0.10 03/17/2023    PROT 5.8 04/03/2023    ALBUMIN 3.1 (L) 04/03/2023    ALT 171 (H) 04/03/2023    AST 31 04/03/2023       Encounter Date: 03/25/23   ECG 12 Lead   Result Value    EKG Systolic BP     EKG Diastolic  BP     EKG Ventricular Rate 96    EKG Atrial Rate 96    EKG P-R Interval 134    EKG QRS Duration 78    EKG Q-T Interval 364    EKG QTC Calculation 459    EKG Calculated P Axis 36    EKG Calculated R Axis 40    EKG Calculated T Axis 10    QTC Fredericia 425    Narrative    NORMAL SINUS RHYTHM  NONSPECIFIC T WAVE ABNORMALITY  ABNORMAL ECG  WHEN COMPARED WITH ECG OF 28-Mar-2023 12:13,  NO SIGNIFICANT CHANGE WAS FOUND       No results found for requested labs within last 30 days.       Objective:     Vital Signs  Temp:  [36.7 ??C (98.1 ??F)-36.8 ??C (98.2 ??F)] 36.8 ??C (98.2 ??F)  Heart Rate:  [77-86] 86  Resp:  [16-18] 16  BP: (95-112)/(45-70) 99/70  MAP (mmHg):  [62-79] 79  SpO2:  [97 %-98 %] 97 %    Physical Exam    GENERAL:  Well developed, well-nourished female and is in no apparent distress. Sitting in bed comfortably  HEAD/NECK:    Reveals normocephalic/atraumatic. Corpak tube in place  CARDIOVASCULAR:   Regular rate well perfused  LUNGS:   Normal work of breathing, no supplemental 02  EXTREMITIES:  Warm, no clubbing, cyanosis, or edema was noted.  SKIN:  No obvious rashes lesions or erythema        Problem List    Principal Problem:    Intractable abdominal pain  Active Problems:    [name]    Cancer associated pain    Pain medication agreement signed    Pouchitis (CMS-HCC)    Elevated liver transaminase level    Positive blood culture

## 2023-04-03 NOTE — Unmapped (Signed)
[facility])   Consultation - Liaison (CL) Psychiatry  CL Psychology Follow-Up Note      Service Date:  04/03/23  Admit Date:  03/25/23  Clinician:  Arlana Pouch, PsyD  Intervention: 45 min Individual Psychotherapy  Face-to-Face Clinical Contact with Patient: 40 min  Method of Interaction: In-Person    BACKGROUND INFORMATION AND REASON FOR REFERRAL:   Please see H&P and other recent records for full details. 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 The patient was admitted to?[facility] Hospitalist service for management?of the concerns above. CL Psychology services were requested for evaluation and psychotherapy to address anxiety.    Upon initial evaluation, the patient endorsed recurrent episodes depressive symptoms, including depressed mood, hopelessness, and low self-worth that began when she was approximately 23yo in the context of health-related and interpersonal stressors that is consistent with major depressive disorder. She also endorsed lifelong anxiety about numerous domains that is consistent with generalized anxiety disorder. Additionally, Pt endorsed history of significant medical trauma and associated recurrent nightmares, flashbacks, dissociative episodes, hypervigilance,and avoidance of trauma-related reminders that is concerning for PTSD. Pt is amenable to working with Psychology on techniques to minimize anxiety that may be interfering with care progression during admission.     The patient was seen today for follow-up psychotherapy session to address anxiety.    Interval events:  -TF increased to 50 mL/h on 1/23    ASSESSMENT  IMPRESSIONS/SUMMARY    At follow-up, Pt's mood appeared improved. Session topics included introduction of a cognitive defusion technique (Leaves on a Stream) to improve coping with anxiety, pain, and nausea. Pt actively engaged and receptive to intervention .    DIAGNOSTIC IMPRESSIONS    Major depressive disorder, recurrent episode, moderate  Generalized anxiety disorder  Posttraumatic stress disorder    Risk Assessment:  ASQ screening result: not completed    -A full risk assessment was previously performed on 03/31/23.  Risk assessment remains essentially unchanged.    Current suicide risk: low risk  Current homicide risk: low risk          PLAN  RECOMMENDATIONS    ## Safety and Observation Level:   -This patient is not currently under IVC. If safety concerns arise, please page psychiatry for an evaluation. Recommend routine level of observation per primary team.    ## Follow-up:  - The patient desires ongoing follow-up from CL Psychology while medically inpatient.  - The patient is being followed by Psychiatry.    ## Disposition:  -There are no psychological contraindications to discharging this patient when medically appropriate.   - When this patient is discharged, please ensure that their AVS includes information about the 28 Suicide & Crisis Lifeline.      SUBJECTIVE  SESSION CONTENT     Session began with a brief check-in. Pt shared salient updates since her last session. She noted that tube feeds were increased yesterday and indicated that she had increased nausea and pain as a result. Pt provided psychoeducation on cognitive defusion technique, Leaves on a Stream. Pt participated in exercise during session. Pt discussed the experience, noting it helped take her mind off of her discomfort. Pt indicated she would add it to her repertoire of coping skills, noting that she also benefits from using squared breathing techniques.     OBJECTIVE   MENTAL  STATUS     Orientation and consciousness: AOx4   Appearance: Appears stated age  Behavior: Calm, Cooperative, Direct eye contact, and Polite  Speech: Within normal limits  Language: Intact  Mood: Neutral  Affect: Full and Mood congruent  Perceptual disturbance (hallucinations, illusions): None  Thought process and association: Linear and coherent  Thought content (delusions, obsessions etc.): None  Insight: Intact  Judgement: Intact  Memory: WNL, although not formally assessed     EDUCATION/INTERVENTIONS:    -Completed mood check-in and reviewed the patient's current stressors and strategies to manage stress.  -Supportive interventions   -Discussed the patient's treatment goals, revisited discussion of treatment options, and  explored goals of care.

## 2023-04-03 NOTE — Unmapped (Signed)
Hospital Medicine Daily Progress Note    Assessment/Plan:    Principal Problem:    Intractable abdominal pain  Active Problems:    [name]    Cancer associated pain    Pain medication agreement signed    Pouchitis (CMS-HCC)    Elevated liver transaminase level    Positive blood culture   Malnutrition Evaluation as performed by RD, LDN: Severe Protein-Calorie Malnutrition in the context of chronic illness (03/27/23 1311)    Please see wound care flowsheets for wound documentation     LOS: 7 days    [name] [name] is a 23 y.o. 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    Overall: caught in difficult cycle of increasing chronic symptom burden, multiple long hospitalizations, deconditioning. Difficult to have continuity of care and maintenance of rapport with transitions in/out of hospital and rotation of providers. We have agreed to prioritize supportive care and nutrition in stepwise fashion. This means committing to at least one change, however small, per day in order to avoid floundering in the hospital.    # Acute on chronic abdominal pain and nausea  Complex situation discussed at length in prior notes. In brief, her pain and nausea are (deeply) multifactorial but consensus among treatment team is that new / unknown organic issue is unlikely. Plan to focus on symptom control, adequate nutrition.   -- chronic pain service consulted, appreciated  -- NO CHANGES to pain regimen without multiparty discussion including patient, unless true emergency  -- for pain  -- oxycodone (liquid) 20 mg q4 prn  -- hydromorphone 1 mg IV q4 prn ONLY IF inadequate relief 1 hour after oral alternative  -- buprenorphine 20 mcg/h patch   -- for nausea  -- ondansetron IV q8 scheduled  -- promethazine IV q6 scheduled (not as push)  -- scopolamine patch  -- olanzapine 5 mg odt daily prn nausea (reminded she will have to request this)  -- no diphenhydramine IV in absence of clear indication  -- for dyspepsia  -- famotidine bid  -- PPI IV q12  -- for motility  -- naloxegol prn    # Inadequate oral intake  Abnormal gut anatomy notwithstanding, it is the consensus among hospitalist and GI services that oral and/or enteral feeding should be sufficient to meet caloric and hydration needs. No clinical or biochemical evidence of refeeding syndrome so far  -- TPN will not be offered in forseeable future  -- titrate tube feeds to ~100% USRDI: INCREASE to 55 mL/h   -- appreciate calorie count per nutrition    # Elevated transaminases  Improving: last AST 31, ALT 171. No evidence of cholestasis or synthetic dysfunction. Presume drug-related though not sure from what culprit  -- follow CMP, INR     # Positive blood cx Strep mitis  Strongly suspect contaminant due to suboptimal technique. Repeat cx 03/27/2023 no growth. Has completed appropriate therapy.    # Presyncope  Inadequate hydration, weight loss are plausible causes    # Pouchitis (dx 03/2023)  Ongoing small-volume hematochezia that she attributes to pouchitis based on prior experience. No plan for repeat endoscopy or colonoscopy during this admission, per GI, given recent exam  -- cefdinir 300 mg bid to complete 4 weeks (end = 04/07/2023) then daily for 4 weeks (end = 05/05/2023)  -- trial liquid formulation; if not  tolerated, then tablet    # Desmoid tumors  -- okay to hold nirogesat while having difficulty with oral meds, no urgency to resume per Dr [name]    # Mood / anxiety  -- continue escitalopram, mirtazipine  -- lorazepam 1 mg bid prn (home medication)    Daily checklist:  VTE prophylaxis: low risk, PADUA score < 4; encourage ambulation  Diet: Nutrition Therapy Regular/House  Osmolite 1.5 (Standard 1.5 Cal) continuous tube feed  Supplement Pediatric; Pediasure (PED Standard); # of Products PER Serving: 1 2xd PC  Code status: Full Code  HCDM:   HCDM ([place] policy, no known patient preference): Medeiros,[name] - Mother - [phone]    HCDM, back-up (If primary HCDM is unavailable): Dano,Jeff - Father - [phone]    Disposition: Floor status. Discharge goals are (1) meeting 100% of caloric needs via any combination of oral and enteral feeding; (2) off IV medications for symptom control. AIR referrals sent; family would prefer [facility]    PT/OT recommendations: Skilled PT services indicated, 5x weekly, High intensity (04/02/23) / 5x weekly, High intensity (04/02/23)  DME needs: None (04/02/23) / Defer to post acute (04/02/23)    I personally spent 35 minutes face-to-face and non-face-to-face in the care of this patient, which includes all pre, intra, and post visit time on the date of service.  All documented time was specific to the E/M visit and does not include any procedures that may have been performed.    Please page [facility] [phone] with questions. This note reflects active problems and daily updates. Please refer to hospital course for additional details and resolved problems.    ___________________________________________________________________    Subjective:  Ate some oatmeal and bites of bagel in evening  Feeds increased to 50 mL/h  Another difficult night due to pain, nausea, burning sensation in bowels  Has just had discussion with psychologist, reiterates concerns about any discussion of mental health compromising her medical care  Would like to have Desitin cream available  Did not receive cefdinir, not sure why  Was not aware she would need to request olanzapine rather than get a scheduled dose  Feels hesitant about changing any meds or advancing feeds  Not feeling puffy  Some questions asked and answered regarding trends in daily labs    PRNs (0700):  oxycodone x 3  hydromorphone x 4    Labs/Studies:  Labs and Studies from the last 24hrs per EMR and Reviewed    Objective:  Temp:  [36.7 ??C (98.1 ??F)-36.8 ??C (98.2 ??F)] 36.8 ??C (98.2 ??F)  Heart Rate:  [77-86] 86  Resp:  [16-18] 16  BP: (95-112)/(45-70) 99/70  SpO2:  [97 %-98 %] 97 %    GEN: not acutely ill appearing young woman sitting up on bed  HEENT: small bore ND tube left nostril  PULM: unlabored in room air  ABD: not distended  EXT: no ankle edema  MSK: apparent loss of bulk in large muscle groups, unchanged  NEURO: alert and oriented to person place time, no focal deficits    LUE PICC present, site clean, some bruising distally but no erythema and no qualitative change in arm circumference    ------------  Dolores Patty, MD PhD  [facility] Medicine

## 2023-04-03 NOTE — Unmapped (Addendum)
Patient took all liquid medications and tolerated them well. Patients pain well controlled through prn medications. Patient got up and had a PT/OT eval. Patient was able to sleep through morning. Patient states she has had 3 bowel movements so far today. Patients tube feed increased to 50 an hour with no issues as of now. Patient requesting a room in the bedtower or a room with a bathroom. Patient states a window would help with her mental health. Patient overall calm and cooperative. Patient ordered dinner and ate oatmeal by mouth and a bagel, she states she was in pain while eating but still able to eat. No nausea or vomiting noted.  Problem: Adult Inpatient Plan of Care  Goal: Plan of Care Review  Outcome: Ongoing - Unchanged  Flowsheets (Taken 04/02/2023 1618)  Progress: no change  Plan of Care Reviewed With: patient  Goal: Absence of Hospital-Acquired Illness or Injury  Outcome: Ongoing - Unchanged  Intervention: Identify and Manage Fall Risk  Recent Flowsheet Documentation  Taken 04/02/2023 1400 by Corey Harold, RN  Safety Interventions:   fall reduction program maintained   lighting adjusted for tasks/safety   low bed  Taken 04/02/2023 1200 by Corey Harold, RN  Safety Interventions:   fall reduction program maintained   lighting adjusted for tasks/safety   low bed   latex precautions  Taken 04/02/2023 1000 by Corey Harold, RN  Safety Interventions:   fall reduction program maintained   lighting adjusted for tasks/safety   low bed  Taken 04/02/2023 0800 by Corey Harold, RN  Safety Interventions:   fall reduction program maintained   family at bedside   lighting adjusted for tasks/safety   low bed  Intervention: Prevent Skin Injury  Recent Flowsheet Documentation  Taken 04/02/2023 1230 by Corey Harold, RN  Positioning for Skin: Supine/Back  Device Skin Pressure Protection: absorbent pad utilized/changed  Skin Protection: adhesive use limited  Intervention: Prevent Infection  Recent Flowsheet Documentation  Taken 04/02/2023 1400 by Corey Harold, RN  Infection Prevention: cohorting utilized  Taken 04/02/2023 1200 by Corey Harold, RN  Infection Prevention: cohorting utilized  Taken 04/02/2023 1000 by Corey Harold, RN  Infection Prevention: cohorting utilized  Taken 04/02/2023 0800 by Corey Harold, RN  Infection Prevention: environmental surveillance performed  Goal: Optimal Comfort and Wellbeing  Outcome: Ongoing - Unchanged  Goal: Readiness for Transition of Care  Outcome: Ongoing - Unchanged     Problem: Infection  Goal: Absence of Infection Signs and Symptoms  Outcome: Ongoing - Unchanged  Intervention: Prevent or Manage Infection  Recent Flowsheet Documentation  Taken 04/02/2023 1400 by Corey Harold, RN  Infection Management: aseptic technique maintained  Isolation Precautions: protective precautions maintained  Taken 04/02/2023 1200 by Corey Harold, RN  Infection Management: aseptic technique maintained  Isolation Precautions: protective precautions maintained  Taken 04/02/2023 1000 by Corey Harold, RN  Infection Management: aseptic technique maintained  Isolation Precautions: protective precautions maintained  Taken 04/02/2023 0800 by Corey Harold, RN  Infection Management: aseptic technique maintained     Problem: Latex Allergy  Goal: Absence of Allergy Symptoms  Outcome: Ongoing - Unchanged  Intervention: Maintain Latex-Aware Environment  Recent Flowsheet Documentation  Taken 04/02/2023 1400 by Corey Harold, RN  Latex Precautions: latex precautions maintained  Taken 04/02/2023 1200 by Corey Harold, RN  Latex Precautions: latex precautions maintained  Taken 04/02/2023 1000 by Corey Harold, RN  Latex Precautions: latex precautions maintained  Taken 04/02/2023 0800 by Corey Harold, RN  Latex Precautions: latex precautions maintained     Problem: Oncology  Care  Goal: Effective Coping  Outcome: Ongoing - Unchanged  Goal: Improved Activity Tolerance  Outcome: Ongoing - Unchanged  Goal: Optimal Oral Intake  Outcome: Ongoing - Unchanged  Goal: Improved Oral Mucous Membrane Integrity  Outcome: Ongoing - Unchanged  Intervention: Promote Oral Comfort and Health  Recent Flowsheet Documentation  Taken 04/02/2023 1230 by Corey Harold, RN  Oral Mucous Membrane Protection: lip/mouth moisturizer applied  Goal: Optimal Pain Control and Function  Outcome: Ongoing - Unchanged     Problem: Self-Care Deficit  Goal: Improved Ability to Complete Activities of Daily Living  Outcome: Ongoing - Unchanged     Problem: Fall Injury Risk  Goal: Absence of Fall and Fall-Related Injury  Outcome: Ongoing - Unchanged  Intervention: Promote Injury-Free Environment  Recent Flowsheet Documentation  Taken 04/02/2023 1400 by Corey Harold, RN  Safety Interventions:   fall reduction program maintained   lighting adjusted for tasks/safety   low bed  Taken 04/02/2023 1200 by Corey Harold, RN  Safety Interventions:   fall reduction program maintained   lighting adjusted for tasks/safety   low bed   latex precautions  Taken 04/02/2023 1000 by Corey Harold, RN  Safety Interventions:   fall reduction program maintained   lighting adjusted for tasks/safety   low bed  Taken 04/02/2023 0800 by Corey Harold, RN  Safety Interventions:   fall reduction program maintained   family at bedside   lighting adjusted for tasks/safety   low bed     Problem: Skin Injury Risk Increased  Goal: Skin Health and Integrity  Outcome: Ongoing - Unchanged  Intervention: Optimize Skin Protection  Recent Flowsheet Documentation  Taken 04/02/2023 1230 by Corey Harold, RN  Pressure Reduction Techniques: frequent weight shift encouraged  Pressure Reduction Devices: alternating pressure pump (APP)  Skin Protection: adhesive use limited     Problem: Pain Acute  Goal: Optimal Pain Control and Function  Outcome: Ongoing - Unchanged

## 2023-04-04 LAB — PHOSPHORUS: PHOSPHORUS: 5.3 mg/dL — ABNORMAL HIGH (ref 2.4–5.1)

## 2023-04-04 LAB — COMPREHENSIVE METABOLIC PANEL
ALBUMIN: 3.1 g/dL — ABNORMAL LOW (ref 3.4–5.0)
ALKALINE PHOSPHATASE: 139 U/L — ABNORMAL HIGH (ref 46–116)
ALT (SGPT): 128 U/L — ABNORMAL HIGH (ref 10–49)
ANION GAP: 9 mmol/L (ref 5–14)
AST (SGOT): 35 U/L — ABNORMAL HIGH (ref <=34)
BILIRUBIN TOTAL: 0.2 mg/dL — ABNORMAL LOW (ref 0.3–1.2)
BLOOD UREA NITROGEN: 11 mg/dL (ref 9–23)
BUN / CREAT RATIO: 22
CALCIUM: 9.5 mg/dL (ref 8.7–10.4)
CHLORIDE: 103 mmol/L (ref 98–107)
CO2: 32 mmol/L — ABNORMAL HIGH (ref 20.0–31.0)
CREATININE: 0.51 mg/dL — ABNORMAL LOW (ref 0.55–1.02)
EGFR CKD-EPI (2021) FEMALE: >90 mL/min/{1.73_m2} (ref >=60)
GLUCOSE RANDOM: 132 mg/dL (ref 70–179)
POTASSIUM: 4.4 mmol/L (ref 3.4–4.8)
PROTEIN TOTAL: 6 g/dL (ref 5.7–8.2)
SODIUM: 144 mmol/L (ref 135–145)

## 2023-04-04 LAB — PROTIME-INR
INR: 0.99
PROTIME: 11.3 s (ref 9.9–12.6)

## 2023-04-04 LAB — MAGNESIUM: MAGNESIUM: 2.1 mg/dL (ref 1.6–2.6)

## 2023-04-04 MED ADMIN — pantoprazole (Protonix) injection 40 mg: 40 mg | INTRAVENOUS | @ 03:00:00

## 2023-04-04 MED ADMIN — HYDROmorphone (PF) (DILAUDID) injection 1 mg: 1 mg | INTRAVENOUS | @ 03:00:00 | Stop: 2023-04-11

## 2023-04-04 MED ADMIN — promethazine (PHENERGAN) 12.5 mg in sodium chloride (NS) 0.9 % 50 mL IVPB: 12.5 mg | INTRAVENOUS | @ 21:00:00

## 2023-04-04 MED ADMIN — sodium chloride (NS) 0.9 % flush 10 mL: 10 mL | INTRAVENOUS | @ 03:00:00

## 2023-04-04 MED ADMIN — ammonium lactate (LAC-HYDRIN) 12 % lotion 1 Application: 1 | TOPICAL | @ 03:00:00

## 2023-04-04 MED ADMIN — escitalopram (LEXAPRO) oral solution: 5 mg | ENTERAL | @ 15:00:00

## 2023-04-04 MED ADMIN — oxyCODONE (ROXICODONE) 5 mg/5 mL solution 20 mg: 20 mg | ORAL | @ 15:00:00 | Stop: 2023-04-09

## 2023-04-04 MED ADMIN — oxyCODONE (ROXICODONE) 5 mg/5 mL solution 20 mg: 20 mg | ORAL | @ 10:00:00 | Stop: 2023-04-09

## 2023-04-04 MED ADMIN — scopolamine (TRANSDERM-SCOP) 1 mg over 3 days topical patch 1 mg: 1 | TOPICAL | @ 13:00:00

## 2023-04-04 MED ADMIN — sodium chloride (NS) 0.9 % flush 10 mL: 10 mL | INTRAVENOUS | @ 19:00:00

## 2023-04-04 MED ADMIN — HYDROmorphone (PF) (DILAUDID) injection 1 mg: 1 mg | INTRAVENOUS | @ 21:00:00 | Stop: 2023-04-11

## 2023-04-04 MED ADMIN — ondansetron (ZOFRAN) injection 8 mg: 8 mg | INTRAVENOUS | @ 03:00:00

## 2023-04-04 MED ADMIN — ondansetron (ZOFRAN) injection 8 mg: 8 mg | INTRAVENOUS | @ 12:00:00

## 2023-04-04 MED ADMIN — guar gum (NUTRISOURCE) 1 packet: 1 | ENTERAL | @ 19:00:00

## 2023-04-04 MED ADMIN — pantoprazole (Protonix) injection 40 mg: 40 mg | INTRAVENOUS | @ 15:00:00

## 2023-04-04 MED ADMIN — oxyCODONE (ROXICODONE) 5 mg/5 mL solution 20 mg: 20 mg | ORAL | Stop: 2023-04-09

## 2023-04-04 MED ADMIN — oxyCODONE (ROXICODONE) 5 mg/5 mL solution 20 mg: 20 mg | ORAL | @ 19:00:00 | Stop: 2023-04-09

## 2023-04-04 MED ADMIN — famotidine (PEPCID) oral suspension: 20 mg | ENTERAL | @ 06:00:00

## 2023-04-04 MED ADMIN — acetaminophen (TYLENOL) oral liquid: 1000 mg | ENTERAL | @ 19:00:00

## 2023-04-04 MED ADMIN — cefdinir (OMNICEF) oral suspension: 300 mg | ORAL | @ 06:00:00 | Stop: 2023-04-08

## 2023-04-04 MED ADMIN — sodium chloride (NS) 0.9 % flush 10 mL: 10 mL | INTRAVENOUS | @ 11:00:00

## 2023-04-04 MED ADMIN — HYDROmorphone (PF) (DILAUDID) injection 1 mg: 1 mg | INTRAVENOUS | @ 17:00:00 | Stop: 2023-04-11

## 2023-04-04 MED ADMIN — cefdinir (OMNICEF) oral suspension: 300 mg | ORAL | @ 19:00:00 | Stop: 2023-04-08

## 2023-04-04 MED ADMIN — promethazine (PHENERGAN) 12.5 mg in sodium chloride (NS) 0.9 % 50 mL IVPB: 12.5 mg | INTRAVENOUS | @ 06:00:00

## 2023-04-04 MED ADMIN — HYDROmorphone (PF) (DILAUDID) injection 1 mg: 1 mg | INTRAVENOUS | @ 07:00:00 | Stop: 2023-04-11

## 2023-04-04 MED ADMIN — oxyCODONE (ROXICODONE) 5 mg/5 mL solution 20 mg: 20 mg | ORAL | @ 06:00:00 | Stop: 2023-04-09

## 2023-04-04 MED ADMIN — ondansetron (ZOFRAN) injection 8 mg: 8 mg | INTRAVENOUS | @ 19:00:00

## 2023-04-04 MED ADMIN — cetirizine (ZYRTEC) oral syrup: 20 mg | ENTERAL | @ 06:00:00

## 2023-04-04 MED ADMIN — promethazine (PHENERGAN) 12.5 mg in sodium chloride (NS) 0.9 % 50 mL IVPB: 12.5 mg | INTRAVENOUS | @ 10:00:00

## 2023-04-04 MED ADMIN — acetaminophen (TYLENOL) oral liquid: 1000 mg | ENTERAL | @ 06:00:00

## 2023-04-04 MED ADMIN — HYDROmorphone (PF) (DILAUDID) injection 1 mg: 1 mg | INTRAVENOUS | @ 13:00:00 | Stop: 2023-04-11

## 2023-04-04 MED ADMIN — oxyCODONE (ROXICODONE) 5 mg/5 mL solution 20 mg: 20 mg | ORAL | @ 01:00:00 | Stop: 2023-04-09

## 2023-04-04 MED ADMIN — promethazine (PHENERGAN) 12.5 mg in sodium chloride (NS) 0.9 % 50 mL IVPB: 12.5 mg | INTRAVENOUS | @ 15:00:00

## 2023-04-04 NOTE — Unmapped (Signed)
Pt has continued to have c/o ABD pain this shift. She was attempting to eat some dinner earlier in the shift and reported increased pain with the attempts. She was able to eat a few bites per her report. Medicated several times with her PRN pain meds. She was also medicated for nausea with both her scheduled and PRN antiemetics with some improvements for a while. She has remained on tube feeds through her [facility] with continued c/o nausea. She has scheduled antiemetics which she has received as scheduled. She has remained independent with most of her ADLs with family assistance. She is able to make her needs known.     Problem: Adult Inpatient Plan of Care  Goal: Plan of Care Review  Outcome: Progressing  Goal: Patient-Specific Goal (Individualized)  Outcome: Progressing  Goal: Absence of Hospital-Acquired Illness or Injury  Outcome: Progressing  Intervention: Identify and Manage Fall Risk  Recent Flowsheet Documentation  Taken 04/04/2023 0000 by Modena Morrow, RN  Safety Interventions:   low bed   fall reduction program maintained   family at bedside  Taken 04/03/2023 2200 by Modena Morrow, RN  Safety Interventions:   low bed   fall reduction program maintained   family at bedside  Taken 04/03/2023 2000 by Modena Morrow, RN  Safety Interventions:   low bed   fall reduction program maintained   family at bedside  Goal: Optimal Comfort and Wellbeing  Outcome: Progressing  Goal: Readiness for Transition of Care  Outcome: Progressing  Goal: Rounds/Family Conference  Outcome: Progressing     Problem: Infection  Goal: Absence of Infection Signs and Symptoms  Outcome: Progressing     Problem: Latex Allergy  Goal: Absence of Allergy Symptoms  Outcome: Progressing     Problem: Oncology Care  Goal: Effective Coping  Outcome: Progressing  Goal: Improved Activity Tolerance  Outcome: Progressing  Goal: Optimal Oral Intake  Outcome: Progressing  Goal: Improved Oral Mucous Membrane Integrity  Outcome: Progressing  Goal: Optimal Pain Control and Function  Outcome: Progressing     Problem: Self-Care Deficit  Goal: Improved Ability to Complete Activities of Daily Living  Outcome: Progressing     Problem: Fall Injury Risk  Goal: Absence of Fall and Fall-Related Injury  Outcome: Progressing  Intervention: Promote Injury-Free Environment  Recent Flowsheet Documentation  Taken 04/04/2023 0000 by Modena Morrow, RN  Safety Interventions:   low bed   fall reduction program maintained   family at bedside  Taken 04/03/2023 2200 by Modena Morrow, RN  Safety Interventions:   low bed   fall reduction program maintained   family at bedside  Taken 04/03/2023 2000 by Modena Morrow, RN  Safety Interventions:   low bed   fall reduction program maintained   family at bedside     Problem: Skin Injury Risk Increased  Goal: Skin Health and Integrity  Outcome: Progressing     Problem: Pain Acute  Goal: Optimal Pain Control and Function  Outcome: Progressing

## 2023-04-04 NOTE — Unmapped (Addendum)
Hospital Medicine Daily Progress Note    Assessment/Plan:    Principal Problem:    Intractable abdominal pain  Active Problems:    [name]    Cancer associated pain    Pain medication agreement signed    Pouchitis (CMS-HCC)    Elevated liver transaminase level    Positive blood culture   Malnutrition Evaluation as performed by RD, LDN: Severe Protein-Calorie Malnutrition in the context of chronic illness (03/27/23 1311)    Please see wound care flowsheets for wound documentation     LOS: 8 days    [name] [name] is a 23 y.o. 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    Overall: caught in difficult cycle of increasing chronic symptom burden, multiple long hospitalizations, deconditioning. Difficult to have continuity of care and maintenance of rapport with transitions in/out of hospital and rotation of providers. We have agreed to prioritize supportive care and nutrition in stepwise fashion and are making modest progress. Will make at least one change per day, by agreement between inpatient team and patient / mother.    # Acute on chronic abdominal pain and nausea  Complex situation discussed at length in prior notes. In brief, her pain and nausea are (deeply) multifactorial but consensus among treatment team is that new / unknown organic issue is unlikely. Plan to focus on symptom control, adequate nutrition.   -- chronic pain service consulted, appreciated  -- NO CHANGES to pain regimen without multiparty discussion including patient, unless true emergency  -- for pain  -- oxycodone (liquid) 20 mg q4 prn  -- hydromorphone 1 mg IV q4 prn ONLY IF inadequate relief 1 hour after oral alternative  -- buprenorphine 20 mcg/h patch   -- for nausea  -- ondansetron IV q8 scheduled  -- promethazine IV q6 scheduled (not as push)  -- scopolamine patch  -- SCHEDULE olanzapine 5 mg in evening  -- no diphenhydramine IV in absence of clear indication  -- for dyspepsia  -- famotidine bid  -- PPI IV q12  -- for motility  -- naloxegol prn    # Inadequate oral intake  Abnormal gut anatomy notwithstanding, it is the consensus among hospitalist and GI services that oral and/or enteral feeding should be sufficient to meet caloric and hydration needs. Reassured her that we have no biochemical or clinical signs of refeeding syndrome  -- TPN will not be offered in forseeable future  -- titrate tube feeds to ~100% USRDI: STAY at 55 mL/h   -- appreciate calorie count per nutrition, has made at least a little progress  -- try adding fiber supplement to help with stool bulk  -- decrease labs to q48    # Elevated transaminases  Improving: last AST 35, ALT 128. No evidence of cholestasis or synthetic dysfunction. Presume drug-related though not sure from what culprit  -- follow CMP, INR     # Pouchitis (dx 03/2023), resolving  No plan for repeat endoscopy or colonoscopy during this admission, per GI, given recent exam. No significant decrement in Hb so far  -- cefdinir 300 mg bid to complete 4 weeks (end = 04/07/2023) then daily for 4 weeks (end = 05/05/2023)  -- CBC weekly    # Desmoid tumors  -- okay to hold nirogesat while having difficulty with oral meds, no urgency to resume per Dr [name]    #  Mood / anxiety  -- continue escitalopram (has been taking), mirtazipine (has been declining)  -- lorazepam 1 mg bid prn (home medication)    Daily checklist:  VTE prophylaxis: low risk, PADUA score < 4; encourage ambulation  Diet: Nutrition Therapy Regular/House  Osmolite 1.5 (Standard 1.5 Cal) continuous tube feed  Supplement Pediatric; Pediasure (PED Standard); # of Products PER Serving: 1 2xd PC  Code status: Full Code  HCDM:   HCDM ([facility] policy, no known patient preference): Case,[name] - Mother - [phone]    HCDM, back-up (If primary HCDM is unavailable): Brandstetter,Jeff - Father - [phone]    Disposition: Floor status. Discharge goals are (1) meeting 100% of caloric needs via any combination of oral and enteral feeding; (2) off IV medications for symptom control. AIR referrals sent; family would prefer [facility]    PT/OT recommendations: Skilled PT services indicated, 5x weekly, High intensity (04/02/23) / 5x weekly, High intensity (04/02/23)  DME needs: None (04/02/23) / Defer to post acute (04/02/23)    I personally spent 35 minutes face-to-face and non-face-to-face in the care of this patient, which includes all pre, intra, and post visit time on the date of service.  All documented time was specific to the E/M visit and does not include any procedures that may have been performed.    Please page [facility] [phone] with questions. This note reflects active problems and daily updates. Please refer to hospital course for additional details and resolved problems.    ___________________________________________________________________    Subjective:  Reports another rough night  Did eat bowl of cereal in the evening  Two episodes of bowel incontinence, new issue this hospital stay  Feels quite bloated although better after passing stool  Nausea ongoing, says getting worse as feeds increasing  Abdominal pain worse in severity, remains diffuse in location, unchanged in quality (corresponds to use of analgesics below)  Wonders if she could have allergy / intolerance to something in the formula  Left upper arm bruising remains  Did take cefdinir  Did not take olanzapine; forgot to try    PRNs (0700):  oxycodone x 6  hydromorphone x 5    Feeds @ 55 mL/h on pump    Labs/Studies:  Labs and Studies from the last 24hrs per EMR and Reviewed    Objective:  Temp:  [36.5 ??C (97.7 ??F)-36.9 ??C (98.4 ??F)] 36.5 ??C (97.7 ??F)  Heart Rate:  [70-86] 86  Resp:  [16-18] 18  BP: (95-106)/(39-68) 99/58  SpO2:  [97 %-98 %] 97 %    GEN: not acutely ill appearing young woman sitting up on bed  HEENT: small bore ND tube left nostril  PULM: unlabored in room air  ABD: well-healed surgical incisions, somewhat tender, not distended, not tense  EXT: no ankle edema  MSK: apparent loss of bulk in large muscle groups, unchanged  NEURO: alert and oriented to person place time, no focal deficits    LUE PICC present, site clean, some bruising distally but grossly unchanged, few scattered papules around border of dressing    ------------  Dolores Patty, MD PhD  [facility]

## 2023-04-05 MED ADMIN — HYDROmorphone (PF) (DILAUDID) injection 1 mg: 1 mg | INTRAVENOUS | @ 16:00:00 | Stop: 2023-04-11

## 2023-04-05 MED ADMIN — ammonium lactate (LAC-HYDRIN) 12 % lotion 1 Application: 1 | TOPICAL | @ 02:00:00

## 2023-04-05 MED ADMIN — acetaminophen (TYLENOL) oral liquid: 1000 mg | ENTERAL | @ 19:00:00

## 2023-04-05 MED ADMIN — promethazine (PHENERGAN) 12.5 mg in sodium chloride (NS) 0.9 % 50 mL IVPB: 12.5 mg | INTRAVENOUS | @ 04:00:00

## 2023-04-05 MED ADMIN — escitalopram (LEXAPRO) oral solution: 5 mg | ENTERAL | @ 14:00:00

## 2023-04-05 MED ADMIN — oxyCODONE (ROXICODONE) 5 mg/5 mL solution 20 mg: 20 mg | ORAL | @ 19:00:00 | Stop: 2023-04-09

## 2023-04-05 MED ADMIN — lidocaine (ASPERCREME) 4 % 1 patch: 1 | TRANSDERMAL | @ 14:00:00

## 2023-04-05 MED ADMIN — acetaminophen (TYLENOL) oral liquid: 1000 mg | ENTERAL | @ 04:00:00

## 2023-04-05 MED ADMIN — ondansetron (ZOFRAN) injection 8 mg: 8 mg | INTRAVENOUS | @ 11:00:00

## 2023-04-05 MED ADMIN — cefdinir (OMNICEF) oral suspension: 300 mg | ORAL | @ 02:00:00 | Stop: 2023-04-08

## 2023-04-05 MED ADMIN — promethazine (PHENERGAN) 12.5 mg in sodium chloride (NS) 0.9 % 50 mL IVPB: 12.5 mg | INTRAVENOUS | @ 20:00:00

## 2023-04-05 MED ADMIN — HYDROmorphone (PF) (DILAUDID) injection 1 mg: 1 mg | INTRAVENOUS | @ 07:00:00 | Stop: 2023-04-11

## 2023-04-05 MED ADMIN — famotidine (PEPCID) oral suspension: 20 mg | ENTERAL | @ 02:00:00

## 2023-04-05 MED ADMIN — ondansetron (ZOFRAN) injection 8 mg: 8 mg | INTRAVENOUS | @ 19:00:00

## 2023-04-05 MED ADMIN — HYDROmorphone (PF) (DILAUDID) injection 1 mg: 1 mg | INTRAVENOUS | @ 20:00:00 | Stop: 2023-04-11

## 2023-04-05 MED ADMIN — LORazepam (ATIVAN) tablet 1 mg: 1 mg | ORAL | @ 11:00:00

## 2023-04-05 MED ADMIN — sodium chloride (NS) 0.9 % flush 10 mL: 10 mL | INTRAVENOUS | @ 19:00:00

## 2023-04-05 MED ADMIN — oxyCODONE (ROXICODONE) 5 mg/5 mL solution 20 mg: 20 mg | ORAL | @ 09:00:00 | Stop: 2023-04-09

## 2023-04-05 MED ADMIN — promethazine (PHENERGAN) 12.5 mg in sodium chloride (NS) 0.9 % 50 mL IVPB: 12.5 mg | INTRAVENOUS | @ 14:00:00

## 2023-04-05 MED ADMIN — ondansetron (ZOFRAN) injection 8 mg: 8 mg | INTRAVENOUS | @ 04:00:00

## 2023-04-05 MED ADMIN — oxyCODONE (ROXICODONE) 5 mg/5 mL solution 20 mg: 20 mg | ORAL | @ 14:00:00 | Stop: 2023-04-09

## 2023-04-05 MED ADMIN — pantoprazole (Protonix) injection 40 mg: 40 mg | INTRAVENOUS | @ 02:00:00

## 2023-04-05 MED ADMIN — acetaminophen (TYLENOL) oral liquid: 1000 mg | ENTERAL | @ 11:00:00

## 2023-04-05 MED ADMIN — OLANZapine zydis (ZYPREXA) disintegrating tablet 5 mg: 5 mg | ORAL | @ 02:00:00

## 2023-04-05 MED ADMIN — pantoprazole (Protonix) injection 40 mg: 40 mg | INTRAVENOUS | @ 14:00:00

## 2023-04-05 MED ADMIN — HYDROmorphone (PF) (DILAUDID) injection 1 mg: 1 mg | INTRAVENOUS | @ 02:00:00 | Stop: 2023-04-11

## 2023-04-05 MED ADMIN — sodium chloride (NS) 0.9 % flush 10 mL: 10 mL | INTRAVENOUS | @ 04:00:00

## 2023-04-05 MED ADMIN — cefdinir (OMNICEF) oral suspension: 300 mg | ORAL | @ 14:00:00 | Stop: 2023-04-08

## 2023-04-05 MED ADMIN — HYDROmorphone (PF) (DILAUDID) injection 1 mg: 1 mg | INTRAVENOUS | @ 11:00:00 | Stop: 2023-04-11

## 2023-04-05 MED ADMIN — promethazine (PHENERGAN) 12.5 mg in sodium chloride (NS) 0.9 % 50 mL IVPB: 12.5 mg | INTRAVENOUS | @ 10:00:00

## 2023-04-05 MED ADMIN — sodium chloride (NS) 0.9 % flush 10 mL: 10 mL | INTRAVENOUS | @ 11:00:00

## 2023-04-05 NOTE — Unmapped (Signed)
Problem: Adult Inpatient Plan of Care  Goal: Plan of Care Review  Outcome: Ongoing - Unchanged  Goal: Patient-Specific Goal (Individualized)  Outcome: Ongoing - Unchanged  Goal: Absence of Hospital-Acquired Illness or Injury  Outcome: Ongoing - Unchanged  Goal: Optimal Comfort and Wellbeing  Outcome: Ongoing - Unchanged  Goal: Readiness for Transition of Care  Outcome: Ongoing - Unchanged  Goal: Rounds/Family Conference  Outcome: Ongoing - Unchanged     Problem: Infection  Goal: Absence of Infection Signs and Symptoms  Outcome: Ongoing - Unchanged     Problem: Self-Care Deficit  Goal: Improved Ability to Complete Activities of Daily Living  Outcome: Ongoing - Unchanged     Problem: Fall Injury Risk  Goal: Absence of Fall and Fall-Related Injury  Outcome: Ongoing - Unchanged     Problem: Pain Acute  Goal: Optimal Pain Control and Function  Outcome: Ongoing - Unchanged

## 2023-04-05 NOTE — Unmapped (Signed)
Department of Anesthesiology  Pain Medicine Division    Chronic Pain inpatient Consult follow up Note      Requesting Attending Physician:  Dolores Patty, MD  Service Requesting Consult:  Med Bernita Raisin [facility])    Assessment/Recommendations:  The patient was seen in consultation on request of Dolores Patty, MD regarding assistance with pain management. The patient is not obtaining adequate pain relief on current medication regimen.     [name] [name] is a 23 year old female with a complex medical history, including DVT (RUE), [name] syndrome (post-colectomy), desmoid tumors, anemia, and severe protein-calorie malnutrition. She is followed at [facility] for chronic abdominal and right shoulder pain, consistent with cancer-associated pain. 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 Her pain is further complicated by narcotic bowel syndrome, visceral hyperalgesia, and a positive feedback loop where increased opioid use exacerbates the pain. [name] [name]???s previous hospitalization was complicated by ileus, requiring NG tube, a prolonged inpatient rehab stay.  During her most recent hospitalization she was found to have some pouchitis and was continued on oral antibiotic.      Interval: NAEO. Asleep comfortably in bed, awoken by sister to discuss pain. [name] [name] reports pain episode overnight but believes could have been due to poor positioning in bed. She is comfortable at this time and has no concerns. Agreeable to continue with current pain regimen. Continues to tolerate increased TF and gradual PO intake.     PRN medications: Oxycodone liquid 61m mg Q4H x 5, HM IV 1mg  Q4H x6 last 24 hours    Recommendations:  -The chronic pain service is a consult service and does not place orders, just makes recommendations (except ketamine and lidocaine infusions)  -Please evaluate all patients on opioids for appropriateness of prescribing narcan at discharge.  The chronic pain service can assist with this.  Nasal narcan covered by most insurance.  -Recommendations given apply to the current hospitalization and do not reflect long term recommendations.     Recommended to   -Continue Butrans patch 20 mcg/h q. Weekly  -Aggressively address nausea symptoms per primary team choice  -Continue to support patient from multidisciplinary approach   -Continue oxycodone liquid 20 mg every 4 hours as needed (1st line therapy for severe pain), please encourage patient to utilize this modality, unless she has intractable vomiting despite antiemetic  -Continue hydromorphone 1 mg IV every 4 hours as needed (2nd line therapy for severe pain). (2nd line)  -Continue Tylenol 1 g 3 times daily    We will continue to follow her. Chronic pain service will be available to contact via pager.     Please prescribe naloxone at discharge.  Nasal narcan for most insured (Nasal narcan 4mg /actuation, prescribe 1 kit, instructions at [email or web address]).  For uninsured, chronic pain can work to assist in finding an option.  OTC nasal narcan now available at most pharmacies for around $45.    Allergies    Allergies   Allergen Reactions    Adhesive Tape-Silicones Hives and Rash     Paper tape and Tegederm ok. Biopatch causes blistering but has tolerated CHG Tegaderm    Ferrlecit [Sodium Ferric Gluconat-Sucrose] Swelling and Rash    Levofloxacin Swelling and Rash     Swelling in mouth, rash,  Methylnaltrexone      Per Patient: I lost bowel control, severe abdominal cramping, and elevated BP    Neomycin Swelling     Rxn after ear drops; ear swelling    Papaya Hives    Morphine Nausea And Vomiting    Zosyn [Piperacillin-Tazobactam] Itching and Rash     Red and itchy    Compazine [Prochlorperazine] Other (See Comments)     Extreme agitation    Iron Analogues     Reglan [Metoclopramide Hcl] Diarrhea    Iron Dextran Itching     Received iron dextran 06/08/22 over 12 hours, had itching and redness/flushing during the infusion and for a couple days after. Required IV benadryl w/flares between doses and ultimately treated w/IV methylpred for 2 days.     Latex, Natural Rubber Rash       Home Medications    Medications Prior to Admission   Medication Sig Dispense Refill Last Dose/Taking    acetaminophen (TYLENOL) 500 MG tablet Take 2 tablets (1,000 mg total) by mouth every eight (8) hours.       buprenorphine (BUTRANS) 20 mcg/hour PTWK transdermal patch Place 1 patch on the skin every seven (7) days. 4 patch 1     cefdinir (OMNICEF) 300 MG capsule Take 1 capsule (300 mg total) by mouth every twelve (12) hours for 19 days, THEN 1 capsule (300 mg total) daily for 28 days. 66 capsule 0     COURIERED MED OR SUPPLY OGSVEO 50 MG TABLET. 1 each 0     escitalopram oxalate (LEXAPRO) 5 MG tablet Take 1 tablet (5 mg total) by mouth daily. 30 tablet 0     famotidine (PEPCID) 20 MG tablet Take 1 tablet (20 mg total) by mouth nightly. 30 tablet 3     fluticasone propionate (FLONASE) 50 mcg/actuation nasal spray Use 1 spray under the butrans patch once every 7 days to prevent allergic reaction to the adhesive       hydrocortisone 2.5 % cream Apply topically two (2) times a day as needed (hemorrhoids). 30 g 0     lidocaine (ASPERCREME) 4 % patch Place 1 patch on the skin daily as needed.       LORazepam (ATIVAN) 1 MG tablet Take 1 tablet (1 mg total) by mouth two (2) times a day as needed for anxiety. 60 tablet 0     naloxegol (MOVANTIK) 12.5 mg tablet Take 1 tablet (12.5 mg total) by mouth daily. (Patient taking differently: Take 1 tablet (12.5 mg total) by mouth daily as needed.) 30 tablet 0     naloxone (NARCAN) 4 mg nasal spray One spray in either nostril once for known/suspected opioid overdose. May repeat every 2-3 minutes in alternating nostril til EMS arrives 2 each 0     ondansetron (ZOFRAN-ODT) 8 MG disintegrating tablet Dissolve 1 tablet (8 mg total) in the mouth every eight (8) hours as needed for nausea. 90 tablet 0     [EXPIRED] oxyCODONE (ROXICODONE) 5 mg/5 mL solution Take 20 mL (20 mg total) by mouth every four (4) hours as needed for up to 7 days. 840 mL 0     pantoprazole (PROTONIX) 40 MG tablet Take 1 tablet (40 mg total) by mouth daily before breakfast. 30 tablet 0     promethazine (PHENERGAN) 12.5 MG tablet Take 1 tablet (12.5 mg total) by mouth every twelve (12) hours as needed for nausea. 14 tablet 0     scopolamine (TRANSDERM-SCOP) 1 mg over 3 days Place 1  patch (1 mg total) on the skin every third day. 10 patch 2     sucralfate (CARAFATE) 100 mg/mL suspension Take 10 mL (1 g total) by mouth four (4) times a day as needed.       zinc oxide-cod liver oil (DESITIN 40%) 40 % Pste Apply topically daily as needed. 57 g 0        Inpatient Medications  Current Facility-Administered Medications   Medication Dose Route Frequency Provider Last Rate Last Admin    acetaminophen (TYLENOL) oral liquid  1,000 mg Enteral tube: post-pyloric (duodenum, jejunum) Q8H SCH [name], MD   1,000 mg at 04/05/23 0547    ammonium lactate (LAC-HYDRIN) 12 % lotion 1 Application  1 Application Topical BID [name], MD   1 Application at 04/04/23 2032    buprenorphine 20 mcg/hour transdermal patch 1 patch  1 patch Transdermal Weekly Dholakia, Vibhaben, AGNP   1 patch at 04/01/23 1011    cefdinir (OMNICEF) oral suspension  300 mg Oral BID Dolores Patty, MD   300 mg at 04/04/23 2033    Followed by    Melene Muller ON 04/08/2023] cefdinir (OMNICEF) oral suspension  300 mg Oral Daily Dolores Patty, MD        cetirizine (ZYRTEC) oral syrup  20 mg Enteral tube: post-pyloric (duodenum, jejunum) BID [name], MD   20 mg at 04/04/23 0108    [Provider Hold] cholecalciferol (vitamin D3 25 mcg (1,000 units)) tablet 25 mcg  25 mcg Oral Daily Dholakia, Vibhaben, AGNP        escitalopram (LEXAPRO) oral solution  5 mg Enteral tube: post-pyloric (duodenum, jejunum) Daily [name], MD   5 mg at 04/04/23 1003    famotidine (PEPCID) oral suspension  20 mg Enteral tube: post-pyloric (duodenum, jejunum) Nightly [name], MD   20 mg at 04/04/23 2036    fluticasone propionate (FLONASE) 50 mcg/actuation nasal spray 1 spray  1 spray Topical Weekly Dholakia, Vibhaben, AGNP   1 spray at 04/01/23 1011    guar gum (NUTRISOURCE) 1 packet  1 packet Enteral tube: post-pyloric (duodenum, jejunum) TID Dolores Patty, MD   1 packet at 04/04/23 1406    hydrocortisone 2.5 % cream   Topical BID PRN Dholakia, Vibhaben, AGNP        HYDROmorphone (PF) (DILAUDID) injection 1 mg  1 mg Intravenous Q4H PRN [name], MD   1 mg at 04/05/23 1610    hyoscyamine (LEVSIN) tablet 125 mcg  125 mcg Oral Q4H PRN Dolores Patty, MD        IP OKAY TO TREAT   Other Continuous PRN Dholakia, Vibhaben, AGNP        IP OKAY TO TREAT   Other Continuous PRN Dholakia, Vibhaben, AGNP        IP OKAY TO TREAT   Other Continuous PRN Dholakia, Vibhaben, AGNP        lidocaine (ASPERCREME) 4 % 1 patch  1 patch Transdermal Daily Dholakia, Vibhaben, AGNP   1 patch at 04/03/23 0847    loperamide (IMODIUM) oral solution  2 mg Oral QID PRN Dolores Patty, MD        LORazepam (ATIVAN) tablet 1 mg  1 mg Oral BID PRN Dholakia, Vibhaben, AGNP   1 mg at 04/05/23 0559    melatonin tablet 3 mg  3 mg Oral Nightly PRN [name], MD        mirtazapine (REMERON) tablet 7.5 mg  7.5 mg Oral Nightly [name], MD        multivitamin with folic acid 400 mcg tablet 1 tablet  1 tablet Oral Daily [name], MD        naloxegol (MOVANTIK) 12.5 mg tablet 12.5 mg  12.5 mg Oral Daily PRN Dholakia, Vibhaben, AGNP        [Provider Hold] nirogacestat (OGSIVEO) tablet 150 mg  150 mg Oral BID Dholakia, Vibhaben, AGNP        OLANZapine zydis (ZYPREXA) disintegrating tablet 5 mg  5 mg Oral Q PM Dolores Patty, MD   5 mg at 04/04/23 2048    ondansetron [facility]) injection 8 mg  8 mg Intravenous Q8H Dholakia, Vibhaben, AGNP   8 mg at 04/05/23 0532    oxyCODONE (ROXICODONE) 5 mg/5 mL solution 20 mg  20 mg Oral Q4H PRN [name], MD   20 mg at 04/05/23 1610    pantoprazole (Protonix) injection 40 mg  40 mg Intravenous BID Dolores Patty, MD   40 mg at 04/04/23 2040    promethazine (PHENERGAN) 12.5 mg in sodium chloride (NS) 0.9 % 50 mL IVPB  12.5 mg Intravenous Q6H [name], MD   Stopped at 04/05/23 [phone]    scopolamine (TRANSDERM-SCOP) 1 mg over 3 days topical patch 1 mg  1 patch Topical Q72H Dholakia, Vibhaben, AGNP   1 mg at 04/04/23 5409    sodium chloride (NS) 0.9 % flush 10 mL  10 mL Intravenous Q8H [name], MD   10 mL at 04/05/23 0531    [Provider Hold] thiamine mononitrate (vit B1) tablet 200 mg  200 mg Oral Daily [name], MD        zinc oxide-cod liver oil (DESITIN 40%) Paste   Topical Daily PRN Dolores Patty, MD             Past Medical History    Past Medical History:   Diagnosis Date    Abdominal pain     Acid reflux     occas    Anesthesia complication     itching, shaking, coldness; last few surgeries have gone much better    Cancer (CMS-HCC)     Cataract of right eye     COVID-19 virus infection 01/2019    Cyst of thyroid determined by ultrasound     monitoring    [name]     2 right forearm, 1 left thigh, 1 right scapula, 1 under left clavicle; multiple    Difficult intravenous access     [name] (familial adenomatous polyposis)     [name]     Gastric polyps     History of chemotherapy     last treatment approx 05/2019    History of colon polyps     History of COVID-19 01/2019    Ileus (CMS-HCC) 03/16/2022    Iron deficiency anemia due to chronic blood loss     received iron infusion 11-2019    PONV (postoperative nausea and vomiting)     Rectal bleeding     Syncopal episodes     especially if becoming dehydrated           Past Surgical History    Past Surgical History:   Procedure Laterality Date    COLON SURGERY      cyroablation      cystis removal      desmoid removal      PR CLOSE ENTEROSTOMY,RESEC+ANAST N/A  10/09/2020    Procedure: ILEOSTOMY TAKEDOWN;  Surgeon: Mickle Asper, MD;  Location: OR [place];  Service: General Surgery    PR COLONOSCOPY W/BIOPSY SINGLE/MULTIPLE N/A 10/27/2012    Procedure: COLONOSCOPY, FLEXIBLE, PROXIMAL TO SPLENIC FLEXURE; WITH BIOPSY, SINGLE OR MULTIPLE;  Surgeon: Shirlyn Goltz Mir, MD;  Location: PEDS PROCEDURE ROOM [facility];  Service: Gastroenterology    PR COLONOSCOPY W/BIOPSY SINGLE/MULTIPLE N/A 09/14/2013    Procedure: COLONOSCOPY, FLEXIBLE, PROXIMAL TO SPLENIC FLEXURE; WITH BIOPSY, SINGLE OR MULTIPLE;  Surgeon: Shirlyn Goltz Mir, MD;  Location: PEDS PROCEDURE ROOM [facility];  Service: Gastroenterology    PR COLONOSCOPY W/BIOPSY SINGLE/MULTIPLE N/A 11/08/2014    Procedure: COLONOSCOPY, FLEXIBLE, PROXIMAL TO SPLENIC FLEXURE; WITH BIOPSY, SINGLE OR MULTIPLE;  Surgeon: Arnold Long Mir, MD;  Location: PEDS PROCEDURE ROOM [facility];  Service: Gastroenterology    PR COLONOSCOPY W/BIOPSY SINGLE/MULTIPLE N/A 12/26/2015    Procedure: COLONOSCOPY, FLEXIBLE, PROXIMAL TO SPLENIC FLEXURE; WITH BIOPSY, SINGLE OR MULTIPLE;  Surgeon: Arnold Long Mir, MD;  Location: PEDS PROCEDURE ROOM [facility];  Service: Gastroenterology    PR COLONOSCOPY W/BIOPSY SINGLE/MULTIPLE N/A 09/02/2017    Procedure: COLONOSCOPY, FLEXIBLE, PROXIMAL TO SPLENIC FLEXURE; WITH BIOPSY, SINGLE OR MULTIPLE;  Surgeon: Arnold Long Mir, MD;  Location: PEDS PROCEDURE ROOM [place];  Service: Gastroenterology    PR COLSC FLX W/REMOVAL LESION BY HOT BX FORCEPS N/A 08/27/2016    Procedure: COLONOSCOPY, FLEXIBLE, PROXIMAL TO SPLENIC FLEXURE; W/REMOVAL TUMOR/POLYP/OTHER LESION, HOT BX FORCEP/CAUTE;  Surgeon: Arnold Long Mir, MD;  Location: PEDS PROCEDURE ROOM [facility];  Service: Gastroenterology    PR COLSC FLX W/RMVL OF TUMOR POLYP LESION SNARE TQ N/A 02/25/2019    Procedure: COLONOSCOPY FLEX; W/REMOV TUMOR/LES BY SNARE;  Surgeon: Helyn Numbers, MD;  Location: [facility] PROCEDURES [place] [facility];  Service: Gastroenterology    PR COLSC FLX W/RMVL OF TUMOR POLYP LESION SNARE TQ N/A 03/13/2020    Procedure: COLONOSCOPY FLEX; W/REMOV TUMOR/LES BY SNARE;  Surgeon: Helyn Numbers, MD;  Location: [facility] PROCEDURES [place] [facility];  Service: Gastroenterology    PR EXC SKIN BENIG 2.1-3 CM TRUNK,ARM,LEG Right 02/25/2017    Procedure: EXCISION, BENIGN LESION INCLUDE MARGINS, EXCEPT SKIN TAG, LEGS; EXCISED DIAMETER 2.1 TO 3.0 CM;  Surgeon: Clarene Duke, MD;  Location: CHILDRENS OR [facility];  Service: Plastics    PR EXC SKIN BENIG 3.1-4 CM TRUNK,ARM,LEG Right 02/25/2017    Procedure: EXCISION, BENIGN LESION INCLUDE MARGINS, EXCEPT SKIN TAG, ARMS; EXCISED DIAMETER 3.1 TO 4.0 CM;  Surgeon: Clarene Duke, MD;  Location: CHILDRENS OR [facility];  Service: Plastics    PR EXC SKIN BENIG >4 CM FACE,FACIAL Right 02/25/2017    Procedure: EXCISION, OTHER BENIGN LES INCLUD MARGIN, FACE/EARS/EYELIDS/NOSE/LIPS/MUCOUS MEMBRANE; EXCISED DIAM >4.0 CM;  Surgeon: Clarene Duke, MD;  Location: CHILDRENS OR [facility];  Service: Plastics    PR EXC TUMOR SOFT TISSUE LEG/ANKLE SUBQ 3+CM Right 08/05/2019    Procedure: EXCISION, TUMOR, SOFT TISSUE OF LEG OR ANKLE AREA, SUBCUTANEOUS; 3 CM OR GREATER;  Surgeon: Arsenio Katz, MD;  Location: MAIN OR [place];  Service: Plastics    PR EXC TUMOR SOFT TISSUE LEG/ANKLE SUBQ <3CM Right 08/05/2019    Procedure: EXCISION, TUMOR, SOFT TISSUE OF LEG OR ANKLE AREA, SUBCUTANEOUS; LESS THAN 3 CM;  Surgeon: Arsenio Katz, MD;  Location: MAIN OR [facility];  Service: Plastics    PR LAP, SURG PROCTECTOMY W J-POUCH N/A 08/10/2020    Procedure: ROBOTIC ASSISTED LAPAROSCOPIC PROCTOCOLECTOMY, ILEAL J POUCH, WITH OSTOMY;  Surgeon: Mickle Asper, MD;  Location: OR [place];  Service: General  Surgery    PR NDSC EVAL INTSTINAL POUCH DX W/COLLJ SPEC SPX N/A 01/23/2021    Procedure: ENDO EVAL SM INTEST POUCH; DX;  Surgeon: Modena Nunnery, MD;  Location: [facility] PROCEDURES [place] [facility];  Service: Gastroenterology    PR NDSC EVAL INTSTINAL POUCH DX W/COLLJ SPEC SPX N/A 08/27/2021    Procedure: ENDO EVAL SM INTEST POUCH; DX;  Surgeon: Hunt Oris, MD;  [facility]: [facility]URES [facility];  Service: Gastroenterology    PR NDSC EVAL INTSTINAL POUCH DX W/COLLJ SPEC SPX N/A 12/09/2021    Procedure: ENDO EVAL SM INTEST POUCH; DX;  Surgeon: Vidal Schwalbe, MD;  Location: GI PROCEDURES [facility];  Service: Gastroenterology    PR NDSC EVAL INTSTINAL POUCH DX W/COLLJ [facility] SPX Left 04/09/2022    Procedure: ENDO EVAL SM INTEST POUCH; DX;  Surgeon: Modena Nunnery, MD;  Location: [facility] [place] [facility];  Service: Gastroenterology    PR NDSC EVAL INTSTINAL POUCH DX W/COLLJ SPEC SPX N/A 08/05/2022    Procedure: ENDO EVAL SM INTEST POUCH; DX;  Surgeon: Modena Nunnery, MD;  Location: [facility]URES [facility];  Service: Gastroenterology    PR NDSC EVAL INTSTINAL POUCH DX W/COLLJ SPEC SPX N/A 03/13/2023    Procedure: ENDO EVAL SM INTEST POUCH; DX;  Surgeon: Carmon Ginsberg, MD;  Location: GI PROCEDURES [facility];  Service: Gastroenterology    PR NDSC EVAL INTSTINAL POUCH W/BX SINGLE/MULTIPLE N/A 01/20/2022    Procedure: ENDOSCOPIC EVAL OF SMALL INTESTINAL POUCH; DIAGNOSTIC, No biopsies;  Surgeon: Andrey Farmer, MD;  Location: GI PROCEDURES [facility];  Service: Gastroenterology    PR NDSC EVAL INTSTINAL POUCH W/BX SINGLE/MULTIPLE N/A 02/13/2022    Procedure: ENDOSCOPIC EVAL OF SMALL INTESTINAL POUCH; DIAGNOSTIC, WITH BIOPSY;  Surgeon: Bronson Curb, MD;  Location: GI PROCEDURES [facility];  Service: Gastroenterology    PR NDSC EVAL INTSTINAL POUCH W/BX SINGLE/MULTIPLE N/A 03/13/2023    Procedure: ENDOSCOPIC EVAL OF SMALL INTESTINAL POUCH; DIAGNOSTIC, WITH BIOPSY;  Surgeon: Carmon Ginsberg, MD;  Location: GI PROCEDURES [facility];  Service: Gastroenterology    PR UNLISTED PROCEDURE SMALL INTESTINE  01/23/2021    Procedure: UNLISTED PROCEDURE, SMALL INTESTINE;  Surgeon: Modena Nunnery, MD;  Location: [facility] PROCEDURES [place] [facility];  Service: Gastroenterology    PR UNLISTED PROCEDURE SMALL INTESTINE  02/13/2022    Procedure: UNLISTED PROCEDURE, SMALL INTESTINE;  Surgeon: Bronson Curb, MD;  Location: GI PROCEDURES [facility];  Service: Gastroenterology    PR UPPER GI ENDOSCOPY,BIOPSY N/A 10/27/2012    Procedure: UGI ENDOSCOPY; WITH BIOPSY, SINGLE OR MULTIPLE;  Surgeon: Shirlyn Goltz Mir, MD;  Location: PEDS PROCEDURE ROOM [facility];  Service: Gastroenterology    PR UPPER GI ENDOSCOPY,BIOPSY N/A 09/14/2013    Procedure: UGI ENDOSCOPY; WITH BIOPSY, SINGLE OR MULTIPLE;  Surgeon: Shirlyn Goltz Mir, MD;  Location: PEDS PROCEDURE ROOM [facility];  Service: Gastroenterology    PR UPPER GI ENDOSCOPY,BIOPSY N/A 11/08/2014    Procedure: UGI ENDOSCOPY; WITH BIOPSY, SINGLE OR MULTIPLE;  Surgeon: Arnold Long Mir, MD;  Location: PEDS PROCEDURE ROOM [facility];  Service: Gastroenterology    PR UPPER GI ENDOSCOPY,BIOPSY N/A 12/26/2015    Procedure: UGI ENDOSCOPY; WITH BIOPSY, SINGLE OR MULTIPLE;  Surgeon: Arnold Long Mir, MD;  Location: PEDS PROCEDURE ROOM [facility]  Service: Gastroenterology    PR UPPER GI ENDOSCOPY,BIOPSY N/A 08/27/2016    Procedure: UGI ENDOSCOPY; WITH BIOPSY, SINGLE OR MULTIPLE;  Surgeon: Arnold Long Mir, MD;  Location: PEDS PROCEDURE ROOM [facility];  Service: Gastroenterology    PR  UPPER GI ENDOSCOPY,BIOPSY N/A 09/02/2017    Procedure: UGI ENDOSCOPY; WITH BIOPSY, SINGLE OR MULTIPLE;  Surgeon: Arnold Long Mir, MD;  Location: PEDS PROCEDURE ROOM [facility];  Service: Gastroenterology    PR UPPER GI ENDOSCOPY,BIOPSY N/A 03/13/2020    Procedure: UGI ENDOSCOPY; WITH BIOPSY, SINGLE OR MULTIPLE;  Surgeon: Helyn Numbers, MD;  Location: [facility] [place] [facility]; Service: Gastroenterology    PR UPPER GI ENDOSCOPY,BIOPSY N/A 09/05/2021    Procedure: UGI ENDOSCOPY; WITH BIOPSY, SINGLE OR MULTIPLE;  Surgeon: Wendall Papa, MD;  Location: GI PROC[facility]URES [facility];  Service: Gastroenterology    PR UPPER GI ENDOSCOPY,DIAGNOSIS N/A 01/20/2022    Procedure: UGI ENDO, INCLUDE ESOPHAGUS, STOMACH, & DUODENUM &/OR JEJUNUM; DX W/WO COLLECTION SPECIMN, BY BRUSH OR WASH;  Surgeon: Andrey Farmer, MD;  Location: GI PROCEDURES [facility];  Service: Gastroenterology    TUMOR REMOVAL      multiple-head, neck, back, hand, right flank, multiple           Family History    Family History   Problem Relation Age of Onset    No Known Problems Mother     No Known Problems Father     No Known Problems Sister     No Known Problems Brother     Stroke Maternal Grandmother     Other Maternal Grandmother         benign lesions of liver and pancreas, further details unknown    Cancer Maternal Grandmother     Diabetes Maternal Grandmother     Hypertension Maternal Grandmother     Thyroid disease Maternal Grandmother     Arthritis Maternal Grandfather     Asthma Maternal Grandfather     COPD Paternal Grandmother         Deceased    Miscarriages / Stillbirths Paternal Grandmother     Alcohol abuse Paternal Grandfather         Deceased    No Known Problems Maternal Aunt     No Known Problems Maternal Uncle     No Known Problems Paternal Aunt     No Known Problems Paternal Uncle     Anesthesia problems Neg Hx     Broken bones Neg Hx     Cancer Neg Hx     Clotting disorder Neg Hx     Collagen disease Neg Hx     Diabetes Neg Hx     Dislocations Neg Hx     Fibromyalgia Neg Hx     Gout Neg Hx     Hemophilia Neg Hx     Osteoporosis Neg Hx     Rheumatologic disease Neg Hx     Scoliosis Neg Hx     Severe sprains Neg Hx     Sickle cell anemia Neg Hx     Spinal Compression Fracture Neg Hx     Melanoma Neg Hx     Basal cell carcinoma Neg Hx     Squamous cell carcinoma Neg Hx          Social History:  Social History     Tobacco Use   Smoking Status Never    Passive exposure: Past   Smokeless Tobacco Never     Social History     Substance and Sexual Activity   Alcohol Use Never     Social History     Substance and Sexual Activity   Drug Use Never         Lab Results   Component Value  Date    CREATININE 0.51 (L) 04/04/2023       Lab Results   Component Value Date    ALKPHOS 139 (H) 04/04/2023    BILITOT 0.2 (L) 04/04/2023    BILIDIR <0.10 03/17/2023    PROT 6.0 04/04/2023    ALBUMIN 3.1 (L) 04/04/2023    ALT 128 (H) 04/04/2023    AST 35 (H) 04/04/2023       Encounter Date: 03/25/23   ECG 12 Lead   Result Value    EKG Systolic BP     EKG Diastolic BP     EKG Ventricular Rate 96    EKG Atrial Rate 96    EKG P-R Interval 134    EKG QRS Duration 78    EKG Q-T Interval 364    EKG QTC Calculation 459    EKG Calculated P Axis 36    EKG Calculated R Axis 40    EKG Calculated T Axis 10    QTC Fredericia 425    Narrative    NORMAL SINUS RHYTHM  NONSPECIFIC T WAVE ABNORMALITY  ABNORMAL ECG  WHEN COMPARED WITH ECG OF 28-Mar-2023 12:13,  NO SIGNIFICANT CHANGE WAS FOUND  Confirmed by Eldred Manges [phone]) on 04/03/2023 6:13:01 PM       No results found for requested labs within last 30 days.       Objective:     Vital Signs  Temp:  [36.5 ??C (97.7 ??F)-37 ??C (98.6 ??F)] 37 ??C (98.6 ??F)  Heart Rate:  [82-88] 88  Resp:  [18-19] 19  BP: (99-111)/(57-58) 111/57  MAP (mmHg):  [71-72] 72  SpO2:  [97 %] 97 %    Physical Exam    GENERAL:  Well developed, well-nourished female and is in no apparent distress. Sitting in bed comfortably   HEAD/NECK:    Reveals normocephalic/atraumatic. Corpak tube in place  CARDIOVASCULAR:   Regular rate well perfused  LUNGS:   Normal work of breathing, no supplemental 02  EXTREMITIES:  Warm, no clubbing, cyanosis, or edema was noted.  SKIN:  No obvious rashes lesions or erythema        Problem List    Principal Problem:    Intractable abdominal pain  Active Problems:    [name]    Cancer associated pain Pain medication agreement signed    Pouchitis (CMS-HCC)    Elevated liver transaminase level    Positive blood culture

## 2023-04-05 NOTE — Unmapped (Signed)
Hospital Medicine Daily Progress Note    Assessment/Plan:    Principal Problem:    Intractable abdominal pain  Active Problems:    [name]    Cancer associated pain    Pain medication agreement signed    Pouchitis (CMS-HCC)    Elevated liver transaminase level    Positive blood culture   Malnutrition Evaluation as performed by RD, LDN: Severe Protein-Calorie Malnutrition in the context of chronic illness (03/27/23 1311)    Please see wound care flowsheets for wound documentation     LOS: 9 days    [name] [name] is a 23 y.o. 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    Overall: caught in difficult cycle of increasing chronic symptom burden, multiple long hospitalizations, deconditioning. Difficult to have continuity of care and maintenance of rapport with transitions in/out of hospital and rotation of providers. We have agreed to prioritize supportive care and nutrition in stepwise fashion and are making modest progress. Will make at least one change per day, by agreement between inpatient team and patient / mother.    # Acute on chronic abdominal pain and nausea  Complex situation discussed at length in prior notes. In brief, her pain and nausea are (deeply) multifactorial but consensus among treatment team is that new / unknown organic issue is unlikely. Plan to focus on symptom control, adequate nutrition.   -- chronic pain service consulted, appreciated  -- NO CHANGES to pain regimen without multiparty discussion including patient, unless true emergency  -- for pain  -- oxycodone (liquid) 20 mg q4 prn  -- hydromorphone 1 mg IV q4 prn ONLY IF inadequate relief 1 hour after oral alternative  -- buprenorphine 20 mcg/h patch   -- hyoscyamine prn  -- for nausea  -- ondansetron IV q8 scheduled  -- promethazine IV q6 scheduled (not as push)  -- scopolamine patch  -- olanzapine 5 mg in evening  -- no diphenhydramine IV in absence of clear indication  -- for dyspepsia  -- famotidine bid  -- PPI IV q12  -- for motility  -- naloxegol prn  -- okay to use loperamide sparingly    # Inadequate oral intake  Abnormal gut anatomy notwithstanding, it is the consensus among hospitalist and GI services that oral and/or enteral feeding should be sufficient to meet caloric and hydration needs. Has not developed refeeding syndrome  -- TPN will not be offered in forseeable future  -- titrate tube feeds to ~100% USRDI = 65 mL/h: INCREASE to 65 mL/h within next 24 hours  -- appreciate calorie count per nutrition, is eating a little but not calorically meaningful  -- try adding fiber supplement to help with stool bulk  -- follow BMP, Mg phos q48-72, stop once at goal feeds  -- GI to follow and manage wean from enteral nutrition at discharge    # Elevated transaminases  Improving: last AST 35, ALT 128. No evidence of cholestasis or synthetic dysfunction. Presume drug-related though not sure from what culprit  -- can stop following closely as trend is well established    # Pouchitis (dx 03/2023), resolving  No plan for repeat endoscopy or colonoscopy during this admission, per GI, given recent exam. No significant decrement in Hb so far  -- cefdinir 300 mg bid to complete 4 weeks (end = 04/07/2023) then daily for 4  weeks (end = 05/05/2023)  -- CBC weekly    # Desmoid tumors  -- okay to hold nirogesat while having difficulty with oral meds, no urgency to resume per Dr [name]    # Mood / anxiety  -- continue escitalopram (has been taking), mirtazipine (has been declining)  -- lorazepam 1 mg bid prn (home medication)    Daily checklist:  VTE prophylaxis: low risk, PADUA score < 4; encourage ambulation  Diet: Nutrition Therapy Regular/House  Osmolite 1.5 (Standard 1.5 Cal) continuous tube feed  Supplement Pediatric; Pediasure (PED Standard); # of Products PER Serving: 1 2xd PC  Code status: Full Code  HCDM:   HCDM ([facility] policy, no known patient preference): Allard,[name] - Mother - [phone]    HCDM, back-up (If primary HCDM is unavailable): Yip,Jeff - Father - [phone]    Disposition: Floor status. She is interested in [facility] again at discharge; PT/OT notes support, consult placed for candidacy. Primary discharge goal is tube feeding at goal rate. Further medication changes would depend on discharge disposition and what could be accommodated at [facility] if that is her destination    PT/OT recommendations: Skilled PT services indicated, 5x weekly, High intensity (04/02/23) / 5x weekly, High intensity (04/02/23)  DME needs: None (04/02/23) / Defer to post acute (04/02/23)    I personally spent 50 minutes face-to-face and non-face-to-face in the care of this patient, which includes all pre, intra, and post visit time on the date of service.  All documented time was specific to the E/M visit and does not include any procedures that may have been performed.    Please page [facility] [phone] with questions. This note reflects active problems and daily updates. Please refer to hospital course for additional details and resolved problems.    ___________________________________________________________________    Subjective:  Nights remain difficult due to pain, nausea etc  Still having loose stools  Not retaining urine  Received one dose of fiber supplement, other not given (not sure why)  Discussed previous trials of loperamide, hyoscyamine   Discussed plan to expedite request for inpatient bed even if it means team reassignment    PRNs (0700):  oxycodone x 4  hydromorphone x 5    Feeds @ 55 mL/h on pump    Labs/Studies:  Labs and Studies from the last 24hrs per EMR and Reviewed    Objective:  Temp:  [36.7 ??C (98.1 ??F)-37 ??C (98.6 ??F)] 36.8 ??C (98.2 ??F)  Heart Rate:  [78-88] 78  Resp:  [18-19] 18  BP: (104-111)/(51-58) 104/51  SpO2:  [97 %] 97 %    I/O 01/24 0701  01/25 0700 01/25 0701  01/26 0700 01/26 0701  01/27 0700    P.O. 0 0     NG/GT 300      IV Piggyback 50      Total Intake 350 0     Urine (mL/kg/hr) 3 (0)      Total Output(mL/kg) 3 (0.1)      Net +347 0            Urine Occurrence  2 x              GEN: not acutely ill appearing young woman sitting up on bed  HEENT: small bore ND tube left nostril, no skin breakdown or mucosal irritation  PULM: unlabored in room air  ABD: mildly distended, not tense, bowel sounds audible without stethoscope  EXT: at most trace ankle edema  MSK: apparent loss of bulk in  large muscle groups, unchanged  NEURO: alert and oriented to person place time    LUE PICC present, site clean with fresh dressing, bruising similar to prior, papular rash just proximal (she is actively scratching)    ------------  Dolores Patty, MD PhD  [facility]

## 2023-04-06 DIAGNOSIS — D48119 Desmoid tumor: Principal | ICD-10-CM

## 2023-04-06 LAB — COMPREHENSIVE METABOLIC PANEL
ALBUMIN: 3.2 g/dL — ABNORMAL LOW (ref 3.4–5.0)
ALKALINE PHOSPHATASE: 132 U/L — ABNORMAL HIGH (ref 46–116)
ALT (SGPT): 70 U/L — ABNORMAL HIGH (ref 10–49)
ANION GAP: 7 mmol/L (ref 5–14)
AST (SGOT): 21 U/L (ref <=34)
BILIRUBIN TOTAL: 0.2 mg/dL — ABNORMAL LOW (ref 0.3–1.2)
BLOOD UREA NITROGEN: 12 mg/dL (ref 9–23)
BUN / CREAT RATIO: 25
CALCIUM: 9.7 mg/dL (ref 8.7–10.4)
CHLORIDE: 102 mmol/L (ref 98–107)
CO2: 33 mmol/L — ABNORMAL HIGH (ref 20.0–31.0)
CREATININE: 0.48 mg/dL — ABNORMAL LOW (ref 0.55–1.02)
EGFR CKD-EPI (2021) FEMALE: >90 mL/min/{1.73_m2} (ref >=60)
GLUCOSE RANDOM: 92 mg/dL (ref 70–179)
POTASSIUM: 4 mmol/L (ref 3.4–4.8)
PROTEIN TOTAL: 6.1 g/dL (ref 5.7–8.2)
SODIUM: 142 mmol/L (ref 135–145)

## 2023-04-06 LAB — PHOSPHORUS: PHOSPHORUS: 4.6 mg/dL (ref 2.4–5.1)

## 2023-04-06 LAB — CBC
HEMATOCRIT: 33.2 % — ABNORMAL LOW (ref 34.0–44.0)
HEMOGLOBIN: 11 g/dL — ABNORMAL LOW (ref 11.3–14.9)
MEAN CORPUSCULAR HEMOGLOBIN CONC: 33.1 g/dL (ref 32.0–36.0)
MEAN CORPUSCULAR HEMOGLOBIN: 27.2 pg (ref 25.9–32.4)
MEAN CORPUSCULAR VOLUME: 82.1 fL (ref 77.6–95.7)
MEAN PLATELET VOLUME: 11.1 fL — ABNORMAL HIGH (ref 6.8–10.7)
PLATELET COUNT: 189 10*9/L (ref 150–450)
RED BLOOD CELL COUNT: 4.04 10*12/L (ref 3.95–5.13)
RED CELL DISTRIBUTION WIDTH: 16.4 % — ABNORMAL HIGH (ref 12.2–15.2)
WBC ADJUSTED: 6.2 10*9/L (ref 3.6–11.2)

## 2023-04-06 LAB — MAGNESIUM: MAGNESIUM: 2.1 mg/dL (ref 1.6–2.6)

## 2023-04-06 MED ADMIN — HYDROmorphone (PF) (DILAUDID) injection 1 mg: 1 mg | INTRAVENOUS | @ 20:00:00 | Stop: 2023-04-11

## 2023-04-06 MED ADMIN — oxyCODONE (ROXICODONE) 5 mg/5 mL solution 20 mg: 20 mg | ORAL | @ 23:00:00 | Stop: 2023-04-09

## 2023-04-06 MED ADMIN — sodium chloride (NS) 0.9 % flush 10 mL: 10 mL | INTRAVENOUS | @ 03:00:00

## 2023-04-06 MED ADMIN — cefdinir (OMNICEF) oral suspension: 300 mg | ORAL | @ 14:00:00 | Stop: 2023-04-08

## 2023-04-06 MED ADMIN — sodium chloride (NS) 0.9 % flush 10 mL: 10 mL | INTRAVENOUS | @ 10:00:00

## 2023-04-06 MED ADMIN — acetaminophen (TYLENOL) oral liquid: 1000 mg | ENTERAL | @ 10:00:00

## 2023-04-06 MED ADMIN — pantoprazole (Protonix) injection 40 mg: 40 mg | INTRAVENOUS | @ 02:00:00

## 2023-04-06 MED ADMIN — ammonium lactate (LAC-HYDRIN) 12 % lotion 1 Application: 1 | TOPICAL | @ 23:00:00

## 2023-04-06 MED ADMIN — cefdinir (OMNICEF) oral suspension: 300 mg | ORAL | @ 03:00:00 | Stop: 2023-04-08

## 2023-04-06 MED ADMIN — ondansetron (ZOFRAN) injection 8 mg: 8 mg | INTRAVENOUS | @ 03:00:00

## 2023-04-06 MED ADMIN — HYDROmorphone (PF) (DILAUDID) injection 1 mg: 1 mg | INTRAVENOUS | @ 06:00:00 | Stop: 2023-04-11

## 2023-04-06 MED ADMIN — oxyCODONE (ROXICODONE) 5 mg/5 mL solution 20 mg: 20 mg | ORAL | @ 14:00:00 | Stop: 2023-04-09

## 2023-04-06 MED ADMIN — escitalopram (LEXAPRO) oral solution: 5 mg | ENTERAL | @ 14:00:00

## 2023-04-06 MED ADMIN — promethazine (PHENERGAN) 12.5 mg in sodium chloride (NS) 0.9 % 50 mL IVPB: 12.5 mg | INTRAVENOUS | @ 15:00:00

## 2023-04-06 MED ADMIN — guar gum (NUTRISOURCE) 1 packet: 1 | ENTERAL | @ 02:00:00

## 2023-04-06 MED ADMIN — oxyCODONE (ROXICODONE) 5 mg/5 mL solution 20 mg: 20 mg | ORAL | @ 19:00:00 | Stop: 2023-04-09

## 2023-04-06 MED ADMIN — HYDROmorphone (PF) (DILAUDID) injection 1 mg: 1 mg | INTRAVENOUS | @ 15:00:00 | Stop: 2023-04-11

## 2023-04-06 MED ADMIN — promethazine (PHENERGAN) 12.5 mg in sodium chloride (NS) 0.9 % 50 mL IVPB: 12.5 mg | INTRAVENOUS | @ 10:00:00

## 2023-04-06 MED ADMIN — promethazine (PHENERGAN) 12.5 mg in sodium chloride (NS) 0.9 % 50 mL IVPB: 12.5 mg | INTRAVENOUS | @ 03:00:00

## 2023-04-06 MED ADMIN — acetaminophen (TYLENOL) oral liquid: 1000 mg | ENTERAL | @ 03:00:00

## 2023-04-06 MED ADMIN — ondansetron (ZOFRAN) injection 8 mg: 8 mg | INTRAVENOUS | @ 10:00:00

## 2023-04-06 MED ADMIN — HYDROmorphone (PF) (DILAUDID) injection 1 mg: 1 mg | INTRAVENOUS | @ 02:00:00 | Stop: 2023-04-11

## 2023-04-06 MED ADMIN — acetaminophen (TYLENOL) oral liquid: 1000 mg | ENTERAL | @ 19:00:00

## 2023-04-06 MED ADMIN — OLANZapine zydis (ZYPREXA) disintegrating tablet 5 mg: 5 mg | ORAL | @ 02:00:00

## 2023-04-06 MED ADMIN — pantoprazole (Protonix) injection 40 mg: 40 mg | INTRAVENOUS | @ 14:00:00

## 2023-04-06 MED ADMIN — HYDROmorphone (PF) (DILAUDID) injection 1 mg: 1 mg | INTRAVENOUS | Stop: 2023-04-11

## 2023-04-06 MED ADMIN — HYDROmorphone (PF) (DILAUDID) injection 1 mg: 1 mg | INTRAVENOUS | @ 10:00:00 | Stop: 2023-04-11

## 2023-04-06 MED ADMIN — famotidine (PEPCID) oral suspension: 20 mg | ENTERAL | @ 06:00:00

## 2023-04-06 MED ADMIN — lidocaine (ASPERCREME) 4 % 1 patch: 1 | TRANSDERMAL | @ 14:00:00

## 2023-04-06 MED ADMIN — promethazine (PHENERGAN) 12.5 mg in sodium chloride (NS) 0.9 % 50 mL IVPB: 12.5 mg | INTRAVENOUS | @ 21:00:00

## 2023-04-06 MED ADMIN — ondansetron (ZOFRAN) injection 8 mg: 8 mg | INTRAVENOUS | @ 19:00:00

## 2023-04-06 MED ADMIN — sodium chloride (NS) 0.9 % flush 10 mL: 10 mL | INTRAVENOUS | @ 19:00:00

## 2023-04-06 MED ADMIN — oxyCODONE (ROXICODONE) 5 mg/5 mL solution 20 mg: 20 mg | ORAL | Stop: 2023-04-09

## 2023-04-06 NOTE — Unmapped (Addendum)
PICC Troubleshooting Note    VAT requested to assess as pt is c/o tenderness and swelling in Lt upper arm.   Lt upper arm is ecchymotic medially to picc site, tender to touch, No change in arm circumference 25cm    VAT to bedside to assess line.  Site has been assessed by VAT.  Recommendation at this time is PVL'S.    This Secure chat sent to Dr. Danie Binder :    pt has a picc line that was placed 03-29-23. Today pt states she has tenderness and swelling around the picc insertion site. Her Lt upper arm medial to the picc site is ecchymotic, I think that is from the original unsuccessful needle stick for picc insertion. Her arm is mildly swollen. I would recommend PVL's to check for DVT. Pt states she has had a DVT from a picc line in the past. Thank you Juel Burrow VAT RN   Richardine Service, RN Venous Access Team, [phone]     Please notify VAT if any additional assistance is needed.    Richardine Service, RN Venous Access Team, [phone]       Workup Time:  30 minutes     1535 PROVIDER  Joesph July NP acknowledged the secure chat

## 2023-04-06 NOTE — Unmapped (Signed)
Physical Medicine and Rehabilitation  Inpatient Consult Note    Requesting Attending Physician: Joesph July, FNP  Service Requesting Consult: Med Bernita [facility])  Thank you for this consult.  Please contact the PM&R consult pager [phone]) for questions regarding these recommendations.  For questions of bed availability at [facility], contact the Intake Office at [phone]. Please contact your case manager for updates on AIR process as they are in regular contact with the rehab intake office and should have the latest information.    ASSESSMENT:   [name] [name] is a 23 y.o. female with past medical history of [name] s/p proctocolectomy and J-pouch, desmoid tumours, chronic abdominal pain and nausea, anxiety, PTSD related to past medical care, DVT RUE s/p Eliquis x3 months who is admitted 03/25/2023 for constellation of worsening pain, intolerance of oral intake.      RECOMMENDATIONS:  RUE weakness - Impaired ADLs - Fall Risk - Deconditioning - Impaired functional gait, mobility, balance, and endurance  - Please continue to have patient work with PT and OT to maximize functional status with mobility and ADLs.  - Fall risk precautions  - Recommend OOB to chair daily with assistance if appropriate    Bilateral Foot Drop  - Has AFO's but at home; mom said they would bring in as would be helpful for mobility    - PM&R Outpatient Follow up: Please arrange Ambulatory PM&R follow up at [facility] with Dr. Carlena Hurl for bilateral foot drop ([address] Ceasar Lund [place], [place], phone: [phone]); scheduled for 05/11/2023 with Dr. Carlena Hurl     REHABILITATION PLAN OF CARE:  - Recommendation: May become appropriate for ACUTE INPATIENT REHABILITATION (AIR) after regular therapy participation / demonstrated progress and completion of medical workup / formulation of treatment plan.  - Patient/family agreed to [facility] AIR.    MEDICAL BARRIERS TO AIR:  - continuous tube feeds and multiple IV meds    CHECKLIST FOR POTENTIAL ADMISSION TO [place] AIR:   [ ]  EDD/what is estimated discharge date?  [ ]  PT/OT within 48 hours of AIR  [ ]  CBC/BMP & other pertinent labs within 48 hours of AIR  [ ]  Prefer oral pain meds only, but if on IV pain meds, then no more than q 6-8 hours prn  [ ]  If on tube feed diet, needs to be cyclic or bolus feeds for AIR  [ ]  IV meds transitioned to oral/feeding tube    DISCHARGE PLAN AFTER AIR:  - patient will have assistance from family after discharge. Patient's mom at bedside and she and patient's dad and sister will assist at discharge.      AIR PLANNING:  *This PM&R consult does not guarantee that patient has been accepted to [facility] AIR, but can be helpful to guide patient progression.*    AIR patients have complex rehab, nursing, and medical needs and is appropriate for Acute Inpatient Rehabilitation.  In AIR the patient would be expected to tolerate minimum of 3 hrs of therapy daily, 5x/week.  While in AIR, the patient has medical issues requiring active management/supervision by a rehabilitation physician, with face-to-face visits at least 3 days per week to assess the patient both medically and functionally and to modify the course of treatment as needed; The Physiatrist leads an intensive and coordinated interdisciplinary team approach to the delivery of inpatient rehabilitative care that cannot be provided in a less intensive setting such as the home, outpatient center or SNF. If not done in hospital,  patient is at high risk for falls, respiratory failure, bleeding, infections, or delirium.    Discussed rehab options today with patient and mom at bedside and [facility] AIR handout provided. The patient/family/caregiver was counseled and educated on the purpose of Acute Inpatient Rehabilitation and questions/concerns were addressed.     In order for the patient to be considered for admission to [facility] inpatient rehab, the case manager must give the patient / family choice in post-acute discharge options. If the patient desires [place], a referral from case management is required for acceptance to [facility] inpatient rehab.  A referral for [facility] inpatient rehab has been received.     SUBJECTIVE   Reason for Consult: Patient seen in consultation at the request of Joesph July, FNP for evaluation of rehabilitation needs and recommendations.    History of Present Illness: [name] [name] is a 23 y.o. female with a past medical history of [name] s/p proctocolectomy and J-pouch, desmoid tumours, chronic abdominal pain and nausea, anxiety, PTSD related to past medical care, DVT RUE s/p Eliquis x3 months who is admitted 03/25/2023 for constellation of worsening pain, intolerance of oral intake.      Medical Complexity:    GAD - MDD - PTSD  - Psychology consulted and following  - Psychiatry consulted  - Lexapro 5 mg daily  - Mirtazapine 7.5 mg at bedtime   - Lorazepam 1 mg po bid prn  - Melatonin 3 mg at bedtime prn    Acute on Chronic Abdominal Pain  - Follows with [facility]; next appt 04/29/23  - Oxycodone solution 20 mg q 4 hours prn  - Tylenol solution 1000 mg q 8 hours  - Buprenorphine patch 20 mcg/hr weekly  - Lidocaine patch daily   - Dilaudid 1 mg IV q 4 hours prn    Nausea - Dyspepsia  - no diphenhydramine IV in absence of clear indication   - Zofran 8 mg IV q 8 hours  - Zyprexa 5 mg every evening  - Phenergan 12.5 mg IV q 6 hours   - Scopolamine patch q 72 hours  - Famotidine 20 mg at bedtime   - Protonix 40 mg IV bid    [name] - Desmoid Tumors Follows with Dr. Johny Blamer Oncology  - Okay to hold Nirogacestat while having difficulty with oral meds, no urgency to resume per Dr [name]     [name] s/p total colectomy + ileal pouch   - GI consulted  - Continue GIB precautions while inpatient with appropriate access, at least daily CBC, active T&S   - Asked patient to maintain stool diary with photodocumentation of hematochezia   - No acute indication for repeat pouchoscopy, consider EGD this admission for follow up of [name]     Inadequate oral intake   - Dietician consulted  - on calorie count  - On regular diet + continuous tube feeds  - Guar gum for stool bulking tid  - Multivitamin with folic acid supplementation daily    Strep mitis bacteremia   - ID consulted  - s/p Vancomycin 1/16-1/22    Pouchitis (dx 03/2023), resolving   - No plan for repeat endoscopy or colonoscopy during this admission, per GI, given recent exam. No significant decrement in Hgb so far  - cefdinir 300 mg bid to complete 4 weeks (end = 04/07/2023) then daily for 4 weeks (end = 05/05/2023)     Elevated transaminases  Improving: Today: AST 21, ALT 70 and Alk Phos  down-trended to 132. No evidence of cholestasis or synthetic dysfunction. Presume drug-related though not sure from what culprit  - can stop following closely as trend is well established    Hx Bladder spasms  - Hyoscyamine 125 mcg q 4 hours prn    Risk for VTE - Hx RUE DVT: no DVT ppx as per team low risk, PADUA score < 4; encourage ambulation; Recommend DVT ppx; she has h/o DVT RUE and was treated with Eliquis x 3 months; PADUA score=6 points      Today  Patient is lying in bed; mom at bedside. Patient is pleasant and A&O x 4. Patient denies CP/SOB, N/V/D, blurry vision, or HA. She reports abdominal pain and occasional dizziness.     Past Medical and Surgical History:   Past Medical History:   Diagnosis Date    Abdominal pain     Acid reflux     occas    Anesthesia complication     itching, shaking, coldness; last few surgeries have gone much better    Cancer (CMS-HCC)     Cataract of right eye     COVID-19 virus infection 01/2019    Cyst of thyroid determined by ultrasound     monitoring    [name]     2 right forearm, 1 left thigh, 1 right scapula, 1 under left clavicle; multiple    Difficult intravenous access     [name] (familial adenomatous polyposis)     [name]     Gastric polyps     History of chemotherapy     last treatment approx 05/2019    History of colon polyps     History of COVID-19 01/2019    Ileus (CMS-HCC) 03/16/2022    Iron deficiency anemia due to chronic blood loss     received iron infusion 11-2019    PONV (postoperative nausea and vomiting)     Rectal bleeding     Syncopal episodes     especially if becoming dehydrated     Past Surgical History:   Procedure Laterality Date    COLON SURGERY      cyroablation      cystis removal      desmoid removal      PR CLOSE ENTEROSTOMY,RESEC+ANAST N/A 10/09/2020    Procedure: ILEOSTOMY TAKEDOWN;  Surgeon: Mickle Asper, MD;  Location: OR [place];  Service: General Surgery    PR COLONOSCOPY W/BIOPSY SINGLE/MULTIPLE N/A 10/27/2012    Procedure: COLONOSCOPY, FLEXIBLE, PROXIMAL TO SPLENIC FLEXURE; WITH BIOPSY, SINGLE OR MULTIPLE;  Surgeon: Shirlyn Goltz Mir, MD;  Location: PEDS PROCEDURE ROOM [place];  Service: Gastroenterology    PR COLONOSCOPY W/BIOPSY SINGLE/MULTIPLE N/A 09/14/2013    Procedure: COLONOSCOPY, FLEXIBLE, PROXIMAL TO SPLENIC FLEXURE; WITH BIOPSY, SINGLE OR MULTIPLE;  Surgeon: Shirlyn Goltz Mir, MD;  Location: PEDS PROCEDURE ROOM [place];  Service: Gastroenterology    PR COLONOSCOPY W/BIOPSY SINGLE/MULTIPLE N/A 11/08/2014    Procedure: COLONOSCOPY, FLEXIBLE, PROXIMAL TO SPLENIC FLEXURE; WITH BIOPSY, SINGLE OR MULTIPLE;  Surgeon: Arnold Long Mir, MD;  Location: PEDS PROCEDURE ROOM [facility];  Service: Gastroenterology    PR COLONOSCOPY W/BIOPSY SINGLE/MULTIPLE N/A 12/26/2015    Procedure: COLONOSCOPY, FLEXIBLE, PROXIMAL TO SPLENIC FLEXURE; WITH BIOPSY, SINGLE OR MULTIPLE;  Surgeon: Arnold Long Mir, MD;  Location: PEDS PROCEDURE ROOM [facility];  Service: Gastroenterology    PR COLONOSCOPY W/BIOPSY SINGLE/MULTIPLE N/A 09/02/2017    Procedure: COLONOSCOPY, FLEXIBLE, PROXIMAL TO SPLENIC FLEXURE; WITH BIOPSY, SINGLE OR MULTIPLE;  Surgeon: Arnold Long Mir, MD;  Location: PEDS  PROCEDURE ROOM [place];  Service: Gastroenterology    PR COLSC FLX W/REMOVAL LESION BY HOT BX FORCEPS N/A 08/27/2016 Procedure: COLONOSCOPY, FLEXIBLE, PROXIMAL TO SPLENIC FLEXURE; W/REMOVAL TUMOR/POLYP/OTHER LESION, HOT BX FORCEP/CAUTE;  Surgeon: Arnold Long Mir, MD;  Location: PEDS PROCEDURE ROOM [facility];  Service: Gastroenterology    PR COLSC FLX W/RMVL OF TUMOR POLYP LESION SNARE TQ N/A 02/25/2019    Procedure: COLONOSCOPY FLEX; W/REMOV TUMOR/LES BY SNARE;  Surgeon: Helyn Numbers, MD;  Location: GI PROCEDURES [place] [facility];  Service: Gastroenterology    PR COLSC FLX W/RMVL OF TUMOR POLYP LESION SNARE TQ N/A 03/13/2020    Procedure: COLONOSCOPY FLEX; W/REMOV TUMOR/LES BY SNARE;  Surgeon: Helyn Numbers, MD;  Location: [facility] PROCEDURES [place] [facility];  Service: Gastroenterology    PR EXC SKIN BENIG 2.1-3 CM TRUNK,ARM,LEG Right 02/25/2017    Procedure: EXCISION, BENIGN LESION INCLUDE MARGINS, EXCEPT SKIN TAG, LEGS; EXCISED DIAMETER 2.1 TO 3.0 CM;  Surgeon: Clarene Duke, MD;  Location: CHILDRENS OR [facility];  Service: Plastics    PR EXC SKIN BENIG 3.1-4 CM TRUNK,ARM,LEG Right 02/25/2017    Procedure: EXCISION, BENIGN LESION INCLUDE MARGINS, EXCEPT SKIN TAG, ARMS; EXCISED DIAMETER 3.1 TO 4.0 CM;  Surgeon: Clarene Duke, MD;  Location: CHILDRENS OR [facility];  Service: Plastics    PR EXC SKIN BENIG >4 CM FACE,FACIAL Right 02/25/2017    Procedure: EXCISION, OTHER BENIGN LES INCLUD MARGIN, FACE/EARS/EYELIDS/NOSE/LIPS/MUCOUS MEMBRANE; EXCISED DIAM >4.0 CM;  Surgeon: Clarene Duke, MD;  Location: CHILDRENS OR [facility];  Service: Plastics    PR EXC TUMOR SOFT TISSUE LEG/ANKLE SUBQ 3+CM Right 08/05/2019    Procedure: EXCISION, TUMOR, SOFT TISSUE OF LEG OR ANKLE AREA, SUBCUTANEOUS; 3 CM OR GREATER;  Surgeon: Arsenio Katz, MD;  Location: MAIN OR [place];  Service: Plastics    PR EXC TUMOR SOFT TISSUE LEG/ANKLE SUBQ <3CM Right 08/05/2019    Procedure: EXCISION, TUMOR, SOFT TISSUE OF LEG OR ANKLE AREA, SUBCUTANEOUS; LESS THAN 3 CM;  Surgeon: Arsenio Katz, MD;  Location: MAIN OR [facility];  Service: Plastics    PR LAP, SURG PROCTECTOMY W J-POUCH N/A 08/10/2020    Procedure: ROBOTIC ASSISTED LAPAROSCOPIC PROCTOCOLECTOMY, ILEAL J POUCH, WITH OSTOMY;  Surgeon: Mickle Asper, MD;  Location: OR [place];  Service: General Surgery    PR NDSC EVAL INTSTINAL POUCH DX W/COLLJ SPEC SPX N/A 01/23/2021    Procedure: ENDO EVAL SM INTEST POUCH; DX;  Surgeon: Modena Nunnery, MD;  Location: [facility] PROCEDURES [place] [facility];  Service: Gastroenterology    PR NDSC EVAL INTSTINAL POUCH DX W/COLLJ SPEC SPX N/A 08/27/2021    Procedure: ENDO EVAL SM INTEST POUCH; DX;  Surgeon: Hunt Oris, MD;  Location: [facility]URES [facility];  Service: Gastroenterology    PR NDSC EVAL INTSTINAL POUCH DX W/COLLJ SPEC SPX N/A 12/09/2021    Procedure: ENDO EVAL SM INTEST POUCH; DX;  Surgeon: Vidal Schwalbe, MD;  Location: GI PROCEDURES [facility];  Service: Gastroenterology    PR NDSC EVAL INTSTINAL POUCH DX W/COLLJ [facility] SPX Left 04/09/2022    Procedure: ENDO EVAL SM INTEST POUCH; DX;  Surgeon: Modena Nunnery, MD;  Location: [facility] [facility];  Service: Gastroenterology    PR NDSC EVAL INTSTINAL POUCH DX W/COLLJ SPEC SPX N/A 08/05/2022    Procedure: ENDO EVAL SM INTEST POUCH; DX;  Surgeon: Modena Nunnery, MD;  Location: GI PROCEDURES [facility];  Service: Gastroenterology    PR NDSC EVAL INTSTINAL POUCH DX W/COLLJ SPEC SPX N/A 03/13/2023  Procedure: ENDO EVAL SM INTEST POUCH; DX;  Surgeon: Fara Boros Michelene Heady, MD;  Location: GI PROCEDURES [facility];  Service: Gastroenterology    PR NDSC EVAL INTSTINAL POUCH W/BX SINGLE/MULTIPLE N/A 01/20/2022    Procedure: ENDOSCOPIC EVAL OF SMALL INTESTINAL POUCH; DIAGNOSTIC, No biopsies;  Surgeon: Andrey Farmer, MD;  Location: GI PROCEDURES [facility];  Service: Gastroenterology    PR NDSC EVAL INTSTINAL POUCH W/BX SINGLE/MULTIPLE N/A 02/13/2022    Procedure: ENDOSCOPIC EVAL OF SMALL INTESTINAL POUCH; DIAGNOSTIC, WITH BIOPSY;  Surgeon: Bronson Curb, MD; Location: GI PROCEDURES [facility];  Service: Gastroenterology    PR NDSC EVAL INTSTINAL POUCH W/BX SINGLE/MULTIPLE N/A 03/13/2023    Procedure: ENDOSCOPIC EVAL OF SMALL INTESTINAL POUCH; DIAGNOSTIC, WITH BIOPSY;  Surgeon: Carmon Ginsberg, MD;  Location: GI PROCEDURES [facility];  Service: Gastroenterology    PR UNLISTED PROCEDURE SMALL INTESTINE  01/23/2021    Procedure: UNLISTED PROCEDURE, SMALL INTESTINE;  Surgeon: Modena Nunnery, MD;  [facility] [facility] [place] [facility];  Service: Gastroenterology    PR UNLISTED PROCEDURE SMALL INTESTINE  02/13/2022    Procedure: UNLISTED PROCEDURE, SMALL INTESTINE;  Surgeon: Bronson Curb, MD;  Location: GI PROCEDURES [facility];  Service: Gastroenterology    PR UPPER GI ENDOSCOPY,BIOPSY N/A 10/27/2012    Procedure: UGI ENDOSCOPY; WITH BIOPSY, SINGLE OR MULTIPLE;  Surgeon: Shirlyn Goltz Mir, MD;  Location: PEDS PROCEDURE ROOM [facility];  Service: Gastroenterology    PR UPPER GI ENDOSCOPY,BIOPSY N/A 09/14/2013    Procedure: UGI ENDOSCOPY; WITH BIOPSY, SINGLE OR MULTIPLE;  Surgeon: Shirlyn Goltz Mir, MD;  Location: PEDS PROCEDURE ROOM [facility];  Service: Gastroenterology    PR UPPER GI ENDOSCOPY,BIOPSY N/A 11/08/2014    Procedure: UGI ENDOSCOPY; WITH BIOPSY, SINGLE OR MULTIPLE;  Surgeon: Arnold Long Mir, MD;  Location: PEDS PROCEDURE ROOM [facility];  Service: Gastroenterology    PR UPPER GI ENDOSCOPY,BIOPSY N/A 12/26/2015    Procedure: UGI ENDOSCOPY; WITH BIOPSY, SINGLE OR MULTIPLE;  Surgeon: Arnold Long Mir, MD;  Location: PEDS PROCEDURE ROOM [facility];  Service: Gastroenterology    PR UPPER GI ENDOSCOPY,BIOPSY N/A 08/27/2016    Procedure: UGI ENDOSCOPY; WITH BIOPSY, SINGLE OR MULTIPLE;  Surgeon: Arnold Long Mir, MD;  Location: PEDS PROCEDURE ROOM [facility];  Service: Gastroenterology    PR UPPER GI ENDOSCOPY,BIOPSY N/A 09/02/2017    Procedure: UGI ENDOSCOPY; WITH BIOPSY, SINGLE OR MULTIPLE;  Surgeon: Arnold Long Mir, MD;  Location: PEDS PROCEDURE ROOM [facility];  Service: Gastroenterology    PR UPPER GI ENDOSCOPY,BIOPSY N/A 03/13/2020    Procedure: UGI ENDOSCOPY; WITH BIOPSY, SINGLE OR MULTIPLE;  Surgeon: Helyn Numbers, MD;  Location: [facility] [place] [facility];  Service: Gastroenterology    PR UPPER GI ENDOSCOPY,BIOPSY N/A 09/05/2021    Procedure: UGI ENDOSCOPY; WITH BIOPSY, SINGLE OR MULTIPLE;  Surgeon: Wendall Papa, MD;  Location: GI PROCEDURES [facility];  Service: Gastroenterology    PR UPPER GI ENDOSCOPY,DIAGNOSIS N/A 01/20/2022    Procedure: UGI ENDO, INCLUDE ESOPHAGUS, STOMACH, & DUODENUM &/OR JEJUNUM; DX W/WO COLLECTION SPECIMN, BY BRUSH OR WASH;  Surgeon: Andrey Farmer, MD;  Location: GI PROCEDURES [facility];  Service: Gastroenterology    TUMOR REMOVAL      multiple-head, neck, back, hand, right flank, multiple        Cancer History:  Hematology/Oncology History    No history exists.       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       Living Environment: House  Lives With: Father, Mother  Home Living: Multi-level home, Bed/bath upstairs, 1/2 bath on main level, Stairs to alternate level with rails, Stairs to enter without rails, Stairs to alternate level without rails, Handicapped height toilet, Tub/shower unit, Able to Live on main level with bedroom/bathroom  Number of Stairs to Enter (outside): 3  Rail placement (inside): Rail on right side  Number of Stairs to Alternate level (inside): 14  Caregiver Identified?: Yes (family)  Caregiver Availability: 24 hours  Caregiver Ability: Limited lifting    Family History: Reviewed, noncontributory to rehab  family history includes Alcohol abuse in her paternal grandfather; Arthritis in her maternal grandfather; Asthma in her maternal grandfather; COPD in her paternal grandmother; Cancer in her maternal grandmother; Diabetes in her maternal grandmother; Hypertension in her maternal grandmother; Miscarriages / [place] in her paternal grandmother; No Known Problems in her brother, father, maternal aunt, maternal uncle, mother, paternal aunt, paternal uncle, and sister; Other in her maternal grandmother; Stroke in her maternal grandmother; Thyroid disease in her maternal grandmother.    Allergies:   Adhesive tape-silicones; Ferrlecit [sodium ferric gluconat-sucrose]; Levofloxacin; Methylnaltrexone; Neomycin; Papaya; Morphine; Zosyn [piperacillin-tazobactam]; Compazine [prochlorperazine]; Iron analogues; Reglan [metoclopramide hcl]; Iron dextran; and Latex, natural rubber    Medications:   Scheduled    acetaminophen (TYLENOL) oral liquid Q8H SCH    ammonium lactate (LAC-HYDRIN) 12 % lotion 1 Application BID    buprenorphine 20 mcg/hour transdermal patch 1 patch Weekly    cefdinir (OMNICEF) oral suspension BID    cetirizine (ZYRTEC) oral syrup BID    [Provider Hold] cholecalciferol (vitamin D3 25 mcg (1,000 units)) tablet 25 mcg Daily    escitalopram (LEXAPRO) oral solution Daily    famotidine (PEPCID) oral suspension Nightly    fluticasone propionate (FLONASE) 50 mcg/actuation nasal spray 1 spray Weekly    guar gum (NUTRISOURCE) 1 packet TID    lidocaine (ASPERCREME) 4 % 1 patch Daily    mirtazapine (REMERON) tablet 7.5 mg Nightly    multivitamin with folic acid 400 mcg tablet 1 tablet Daily    [Provider Hold] nirogacestat (OGSIVEO) tablet 150 mg BID    OLANZapine zydis (ZYPREXA) disintegrating tablet 5 mg Q PM    ondansetron (ZOFRAN) injection 8 mg Q8H    pantoprazole (Protonix) injection 40 mg BID    promethazine (PHENERGAN) 12.5 mg in sodium chloride (NS) 0.9 % 50 mL IVPB Q6H    scopolamine (TRANSDERM-SCOP) 1 mg over 3 days topical patch 1 mg Q72H    sodium chloride (NS) 0.9 % flush 10 mL Q8H    [Provider Hold] thiamine mononitrate (vit B1) tablet 200 mg Daily     PRN hydrocortisone, , BID PRN  HYDROmorphone, 1 mg, Q4H PRN  hyoscyamine, 125 mcg, Q4H PRN  IP okay to treat, , Continuous PRN  IP okay to treat, , Continuous PRN  IP okay to treat, , Continuous PRN  loperamide, 2 mg, QID PRN  LORazepam, 1 mg, BID PRN  melatonin, 3 mg, Nightly PRN  naloxegol, 12.5 mg, Daily PRN  oxyCODONE, 20 mg, Q4H PRN  zinc oxide-cod liver oil, , Daily PRN      Continuous Infusions IP okay to treat  IP okay to treat  IP okay to treat        Review of Systems:    General ROS: negative  Psychological ROS: positive for - anxiety  Ophthalmic ROS: negative  Respiratory ROS: no cough, shortness of breath, or wheezing  Cardiovascular ROS: no chest pain or dyspnea on exertion  Gastrointestinal ROS: positive for - abdominal pain and nausea/vomiting   Genito-Urinary ROS: no dysuria, trouble voiding, or hematuria  Musculoskeletal ROS: positive for - gait disturbance and muscular weakness  Neurological ROS: positive for - dizziness, gait disturbance, impaired coordination/balance, and weakness negative for - headaches, speech problems, or visual changes  Dermatological ROS: negative for pruritus    Prior Functional Status:  Prior Functional Status: Pt reports recently she has been using rollator for household ambulation, WC for community distances. Has had 2 recent falls while ambulating. She was supposed to receive home PT services, however pt did not receive home services and her mother was driving her to outpatient PT appointments. She also reports being so weak recently that she has had to scoot up and down the stairs.    Current Functional Status: Therapy notes reviewed     Activities of Daily Living:   Assessment  Problem List: Decreased strength, Decreased activity tolerance, Decreased endurance, Decreased mobility, Impaired ADLs, Impaired balance, Pain  Clinical Decision Making: Moderate Complexity  Assessment: Pt seen for progression of OT goals this date, making steady progress towards goals. Pt continues to express a strong interest in returning to AIR to increase her strength and endurance needed to return to PLOF. Pt is demonstrating progress towards her goals, completing LB dressing with set up to don shoes and SBA for functional mobility and SBA for standing ADL tasks at sink level. Pt continues to be limited by generalized weakness, decreased activity tolerance, decreased endurance, decreased trunk strength when standing, and pain which impact her ability to independently participate in ADL tasks. Pt continues to benefit from skilled acute OT services and remains appropriate for post acute OT 5xH to maximize independence.  Today's Interventions: Other  CHG (Chlorhexidine Gluconate) Treatment: Contraindicated  Today's Interventions: Interventions including bed mobility of supine <> sitting, sitting tolerance and balance to complete LB dressing tasks, functional mobility using RW, static and dynamic standing tasks to complete ADL tasks. Education provided regarding general safety with use of RW, energy conservation techniques for improved performance in ADL tasks.              Mobility:   Bed Mobility comments: SBA with supine to sit, HOB was elevated        Skilled Treatment Performed: See gait goal.  PT Post Acute Discharge Recommendations: Skilled PT services indicated, 5x weekly, High intensity    Cognition, Swallow, Speech:   Cognition / Swallow / Speech  Patient's Vision Adequate to Safely Complete Daily Activities: Yes  Patient's Judgement Adequate to Safely Complete Daily Activities: Yes  Patient's Memory Adequate to Safely Complete Daily Activities: Yes  Patient Able to Express Needs/Desires: Yes  Patient has speech problem: No             Assistive Devices: Rollator, Wheelchair-manual, Rolling walker    Precautions:  Safety Interventions  Safety Interventions: fall reduction program maintained, family at bedside  Aspiration Precautions: awake/alert before oral intake  Bleeding Precautions: monitored for signs of bleeding  Infection Management: aseptic technique maintained  Isolation Precautions:  protective precautions maintained  Latex Precautions: latex precautions maintained    OBJECTIVE:   Vitals:  Vitals:    04/05/23 0848 04/05/23 1845 04/05/23 2145 04/06/23 0447   BP: 104/51 101/58 105/59 119/56   Pulse: 78 81 73 83   Resp: 18 16 17 16    Temp: 36.8 ??C (98.2 ??F) 36.8 ??C (98.2 ??F) 36.8 ??C (98.2 ??F) 36.8 ??C (98.2 ??F)   TempSrc: Oral Oral Oral Oral   SpO2: 97% 98% 97% 97%   Weight:       Height:           Physical Exam:    GEN: Lying in bed in NAD.  HEENT: Atraumatic. Normocephalic. Moist mucous membranes. Trachea midline. Corpak intact  RESP: NWOB on RA.  CV: Regular rate and rhythm. No edema; negative Homan's sign BLE  GI: abd soft, ND  GU: deferred  PSYCH: Calm  SKIN: see wound care flow sheet  MSK: Positive for decreased range of motion and weakness  NEURO:  Mental Status: A&Ox 4 attention intact, speech fluid and coherent, follows commands well and answers questions appropriately  Cranial Nerve: No facial droop. Hearing grossly intact bilaterally. Shoulder shrug full and equal.    Motor:      Delt Bi Tri HG   (Delt= Deltoid, Bi= Biceps, Tri= Triceps, HG= Hand Grip)  RUE 3 3 3  3+     LUE 4- 4- 4- 4-                  HF KE       DF       PF        (HF= Hip flexor, KE= Knee Extensor, DF= Dorsiflexor, PF= Plantar Flexor)  RLE     3 3 1 1               LLE   3 3 1 1       Labs and Diagnostic Studies:    Personally reviewed Duayne Cal, AGNP    CBC - Results in Past 30 Days  Result Component Current Result Ref Range Previous Result Ref Range   HCT 33.2 (L) (04/06/2023) 34.0 - 44.0 % 33.5 (L) (04/02/2023) 34.0 - 44.0 %   HGB 11.0 (L) (04/06/2023) 11.3 - 14.9 g/dL 16.1 (L) (0/96/0454) 09.8 - 14.9 g/dL   MCH 11.9 (1/47/8295) 25.9 - 32.4 pg 26.9 (04/02/2023) 25.9 - 32.4 pg   MCHC 33.1 (04/06/2023) 32.0 - 36.0 g/dL 62.1 (05/15/6576) 46.9 - 36.0 g/dL   MCV 62.9 (08/05/4130) 77.6 - 95.7 fL 81.7 (04/02/2023) 77.6 - 95.7 fL   MPV 11.1 (H) (04/06/2023) 6.8 - 10.7 fL 10.6 (04/02/2023) 6.8 - 10.7 fL   Platelet 189 (04/06/2023) 150 - 450 10*9/L 186 (04/02/2023) 150 - 450 10*9/L   RBC 4.04 (04/06/2023) 3.95 - 5.13 10*12/L 4.10 (04/02/2023) 3.95 - 5.13 10*12/L   WBC 6.2 (04/06/2023) 3.6 - 11.2 10*9/L 9.4 (04/02/2023) 3.6 - 11.2 10*9/L     BMP - Results in Past 30 Days  Result Component Current Result Ref Range Previous Result Ref Range   BUN 12 (04/06/2023) 9 - 23 mg/dL 11 (4/40/1027) 9 - 23 mg/dL   Chloride 253 (6/64/4034) 98 - 107 mmol/L 103 (04/04/2023) 98 - 107 mmol/L   CO2 33.0 (H) (04/06/2023) 20.0 - 31.0 mmol/L 32.0 (H) (04/04/2023) 20.0 - 31.0 mmol/L   Creatinine 0.48 (L) (04/06/2023) 0.55 - 1.02 mg/dL 7.42 (L) (5/95/6387) 5.64 - 1.02 mg/dL   Glucose 92 (3/32/9518) 70 - 179 mg/dL 841 (  04/04/2023) 70 - 179 mg/dL   Potassium 4.0 (1/47/8295) 3.4 - 4.8 mmol/L 4.4 (04/04/2023) 3.4 - 4.8 mmol/L   Sodium 142 (04/06/2023) 135 - 145 mmol/L 144 (04/04/2023) 135 - 145 mmol/L     LFT's - Results in Past 30 Days  Result Component Current Result Ref Range Previous Result Ref Range   Albumin 3.2 (L) (04/06/2023) 3.4 - 5.0 g/dL 3.1 (L) (08/28/3084) 3.4 - 5.0 g/dL   Alkaline Phosphatase 132 (H) (04/06/2023) 46 - 116 U/L 139 (H) (04/04/2023) 46 - 116 U/L   ALT 70 (H) (04/06/2023) 10 - 49 U/L 128 (H) (04/04/2023) 10 - 49 U/L   AST 21 (04/06/2023) <=34 U/L 35 (H) (04/04/2023) <=34 U/L   Bilirubin, Direct <0.10 (03/17/2023) 0.00 - 0.30 mg/dL Not in Time Range    Total Bilirubin 0.2 (L) (04/06/2023) 0.3 - 1.2 mg/dL 0.2 (L) (5/78/4696) 0.3 - 1.2 mg/dL       Radiology Results:      No results found.    This patient was seen by the provider (Antoninette Lerner Rogelia Rohrer, AGNP).  This encounter was a an inpatient consultation.    I (Kaileia Flow Rogelia Rohrer, AGNP) personally spent 75 minutes face-to-face and non-face-to-face in the care of this patient, which includes all pre, intra, and post visit time on the date of service. All documented time was specific to the E/M visit and does not include any procedures that may have been performed. Care time for the patient included face-to-face time with the patient, reviewing the patient's chart, communicating with the family and/or other professionals and coordinating care. I personally reviewed prior notes and reviewed test results and discussed management or test interpretation with external physician/other qualified health care professional/appropriate source.

## 2023-04-06 NOTE — Unmapped (Signed)
[facility])   Consultation - Liaison (CL) Psychiatry  CL Psychology Follow-Up Note      Service Date:  04/06/23  Admit Date:  03/25/23  Clinician:  Arlana Pouch, PsyD  Intervention: 45 min Individual Psychotherapy  Face-to-Face Clinical Contact with Patient: 38 min  Method of Interaction: In-Person    BACKGROUND INFORMATION AND REASON FOR REFERRAL:   Please see H&P and other recent records for full details. 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 The patient was admitted to?[facility] Hospitalist service for management?of the concerns above. CL Psychology services were requested for evaluation and psychotherapy to address anxiety.    Upon initial evaluation, the patient endorsed recurrent episodes depressive symptoms, including depressed mood, hopelessness, and low self-worth that began when she was approximately 23yo in the context of health-related and interpersonal stressors that is consistent with major depressive disorder. She also endorsed lifelong anxiety about numerous domains that is consistent with generalized anxiety disorder. Additionally, Pt endorsed history of significant medical trauma and associated recurrent nightmares, flashbacks, dissociative episodes, hypervigilance,and avoidance of trauma-related reminders that is concerning for PTSD. Pt is amenable to working with Psychology on techniques to minimize anxiety that may be interfering with care progression during admission.     The patient was seen today for follow-up psychotherapy session to address anxiety.    Interval events:  -Pt transferred to 7BT on 1/26    ASSESSMENT  IMPRESSIONS/SUMMARY    At follow-up, Pt's anxiety and posttrauma symptoms remained improved. She reported she has been using coping skills learned on 1/24 with good benefit. Session topics included using grounding techniques to cope with anxiety/trauma triggers. Pt actively engaged and receptive to feedback.     DIAGNOSTIC IMPRESSIONS    Major depressive disorder, recurrent episode, moderate  Generalized anxiety disorder  Posttraumatic stress disorder    Risk Assessment:  ASQ screening result: not completed    -A full risk assessment was previously performed on 03/31/23.  Risk assessment remains essentially unchanged.    Current suicide risk: low risk  Current homicide risk: low risk          PLAN  RECOMMENDATIONS    ## Safety and Observation Level:   -This patient is not currently under IVC. If safety concerns arise, please page psychiatry for an evaluation. Recommend routine level of observation per primary team.    ## Follow-up:  - The patient desires ongoing follow-up from CL Psychology while medically inpatient.  - The patient is being followed by Psychiatry.    ## Disposition:  -There are no psychological contraindications to discharging this patient when medically appropriate.   - When this patient is discharged, please ensure that their AVS includes information about the 45 Suicide & Crisis Lifeline.      SUBJECTIVE  SESSION CONTENT     Session began with a brief check-in. Pt shared that her TF has increased to 22mL/hr and that she is trying to increase her PO intake. Pt noted she continues to struggle with nausea and pain. Pt indicated she has found cognitive defusion techniques helpful for coping with anxiety. Pt engaged in discussion of ways in which anxiety/trauma can be triggered in the hospital environment. Discussed using grounding techniques to help Pt cultivate sense of safety if/when triggered.     OBJECTIVE   MENTAL STATUS  Orientation and consciousness: AOx4   Appearance: Appears stated age  Behavior: Calm, Cooperative, Direct eye contact, and Polite  Speech: Within normal limits  Language: Intact  Mood:  Neutral  Affect: Full and Mood congruent  Perceptual disturbance (hallucinations, illusions): None  Thought process and association: Linear and coherent  Thought content (delusions, obsessions etc.): None  Insight: Intact  Judgement: Intact  Memory: WNL, although not formally assessed     EDUCATION/INTERVENTIONS:    -Completed mood check-in and reviewed the patient's current stressors and strategies to manage stress.  -Supportive interventions   -Discussed the patient's treatment goals, revisited discussion of treatment options, and  explored goals of care.

## 2023-04-06 NOTE — Unmapped (Signed)
Daily Progress Note    Assessment/Plan:    Principal Problem:    Intractable abdominal pain  Active Problems:    [name]    Cancer associated pain    Pain medication agreement signed    Pouchitis (CMS-HCC)    Elevated liver transaminase level    Positive blood culture   Malnutrition Evaluation as performed by RD, LDN: Severe Protein-Calorie Malnutrition in the context of chronic illness (03/27/23 1311)             [name] [name] is a 23 y.o. 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     Overall: caught in difficult cycle of increasing chronic symptom burden, multiple long hospitalizations, deconditioning. Difficult to have continuity of care and maintenance of rapport with transitions in/out of hospital and rotation of providers. We have agreed to prioritize supportive care and nutrition in stepwise fashion and are making modest progress. Will make at least one change per day, by agreement between inpatient team and patient / mother.     # Acute on chronic abdominal pain and nausea  Complex situation discussed at length in prior notes. In brief, her pain and nausea are (deeply) multifactorial but consensus among treatment team is that new / unknown organic issue is unlikely. Plan to focus on symptom control, adequate nutrition.   -- chronic pain service consulted, appreciated  -- NO CHANGES to pain regimen without multiparty discussion including patient, unless true emergency  -- for pain  -- oxycodone (liquid) 20 mg q4 prn  -- hydromorphone 1 mg IV q4 prn ONLY IF inadequate relief 1 hour after oral alternative  -- buprenorphine 20 mcg/h patch   -- hyoscyamine prn  -- for nausea  -- ondansetron IV q8 scheduled  -- promethazine IV q6 scheduled (not as push)  -- scopolamine patch  -- olanzapine 5 mg in evening  -- no diphenhydramine IV in absence of clear indication  -- for dyspepsia  -- famotidine bid  -- PPI IV q12  -- for motility  -- naloxegol prn  -- okay to use loperamide sparingly     # Left arm PICC line site swelling, pain and ecchymosis: Left arm PICC line site noted mild swelling and mild ecchymosis from insertion.  No visible swelling noted below the elbow.  Patient reports having prior DVTs from PICC line and concerned about having DVT at this time.   -Ordered PVL left upper extremity to rule out DVT    # Inadequate oral intake  Abnormal gut anatomy notwithstanding, it is the consensus among hospitalist and GI services that oral and/or enteral feeding should be sufficient to meet caloric and hydration needs. Has not developed refeeding syndrome  -- TPN will not be offered in forseeable future  -- titrate tube feeds to ~100% USRDI = 65 mL/h: Currently at 60cc/hr, plan to inctrease to goal by today 1/27  -- appreciate calorie count per nutrition, is eating a little but not calorically meaningful  -- try adding fiber supplement to help with stool bulk  -- follow BMP, Mg phos q48-72, stop once at goal feeds  -- GI to follow and manage wean from enteral nutrition at discharge     # Elevated transaminases  Improving: Labs with AST 21, ALT 70, downtrending from prior.. No evidence of cholestasis or synthetic dysfunction. Presume drug-related though not sure  from what culprit  -- can stop following closely as trend is well established     # Pouchitis (dx 03/2023), resolving  No plan for repeat endoscopy or colonoscopy during this admission, per GI, given recent exam. No significant decrement in Hb so far  -- cefdinir 300 mg bid to complete 4 weeks (end = 04/07/2023) then daily for 4 weeks (end = 05/05/2023)  -- CBC weekly     # Desmoid tumors  -- okay to hold nirogesat while having difficulty with oral meds, no urgency to resume per Dr [name]     # Mood / anxiety  -- continue escitalopram (has been taking), mirtazipine (has been declining)  -- lorazepam 1 mg bid prn (home medication)     Daily checklist:  VTE prophylaxis: low risk, PADUA score < 4; encourage ambulation  Diet: Nutrition Therapy Regular/House  Osmolite 1.5 (Standard 1.5 Cal) continuous tube feed  Supplement Pediatric; Pediasure (PED Standard); # of Products PER Serving: 1 2xd PC  Code status: Full Code  HCDM:   HCDM ([facility] policy, no known patient preference): Ambrosia,[name] - Mother - [phone]    HCDM, back-up (If primary HCDM is unavailable): Schiffer,Jeff - Father - [phone]     Disposition: Floor status. She is interested in [facility] again at discharge; PT/OT notes support, consult placed for candidacy. Primary discharge goal is tube feeding at goal rate. Further medication changes would depend on discharge disposition and what could be accommodated at [facility] if that is her destination.  -A[facility] evaluating currently, pending final decision     PT/OT recommendations: Skilled PT services indicated, 5x weekly, High intensity (04/02/23) / 5x weekly, High intensity (04/02/23)  DME needs: None (04/02/23) / Defer to post acute (04/02/23)     I personally spent 45 minutes face-to-face and non-face-to-face in the care of this patient, which includes all pre, intra, and post visit time on the date of service.  All documented time was specific to the E/M visit and does not include any procedures that may have been performed.      ___________________________________________________________________    Subjective:  No acute events overnight, Patient states pain well controlled, Denies CP or SOB, Continues to have nausea    Recent Results (from the past 24 hours)   CBC    Collection Time: 04/06/23  4:52 AM   Result Value Ref Range    WBC 6.2 3.6 - 11.2 10*9/L    RBC 4.04 3.95 - 5.13 10*12/L    HGB 11.0 (L) 11.3 - 14.9 g/dL    HCT 29.5 (L) 62.1 - 44.0 %    MCV 82.1 77.6 - 95.7 fL    MCH 27.2 25.9 - 32.4 pg    MCHC 33.1 32.0 - 36.0 g/dL    RDW 30.8 (H) 65.7 - 15.2 % MPV 11.1 (H) 6.8 - 10.7 fL    Platelet 189 150 - 450 10*9/L   Comprehensive Metabolic Panel    Collection Time: 04/06/23  5:16 AM   Result Value Ref Range    Sodium 142 135 - 145 mmol/L    Potassium 4.0 3.4 - 4.8 mmol/L    Chloride 102 98 - 107 mmol/L    CO2 33.0 (H) 20.0 - 31.0 mmol/L    Anion Gap 7 5 - 14 mmol/L    BUN 12 9 - 23 mg/dL    Creatinine 8.46 (L) 0.55 - 1.02 mg/dL    BUN/Creatinine Ratio 25     eGFR CKD-EPI (2021) Female >90 >=60 mL/min/1.73m2  Glucose 92 70 - 179 mg/dL    Calcium 9.7 8.7 - 84.1 mg/dL    Albumin 3.2 (L) 3.4 - 5.0 g/dL    Total Protein 6.1 5.7 - 8.2 g/dL    Total Bilirubin 0.2 (L) 0.3 - 1.2 mg/dL    AST 21 <=32 U/L    ALT 70 (H) 10 - 49 U/L    Alkaline Phosphatase 132 (H) 46 - 116 U/L   Magnesium Level    Collection Time: 04/06/23  5:16 AM   Result Value Ref Range    Magnesium 2.1 1.6 - 2.6 mg/dL   Phosphorus Level    Collection Time: 04/06/23  5:16 AM   Result Value Ref Range    Phosphorus 4.6 2.4 - 5.1 mg/dL     Labs/Studies:  Labs and Studies from the last 24hrs per EMR and Reviewed    Objective:  Temp:  [36.8 ??C (98.2 ??F)] 36.8 ??C (98.2 ??F)  Heart Rate:  [70-83] 70  Resp:  [15-17] 15  BP: (101-119)/(56-60) 104/60  SpO2:  [97 %-98 %] 97 %    General: Laying in bed,  alert, conversant, cooperative, and in no distress.  Eyes: No scleral icterus. Clear conjunctivae. EOMi.  ENT: Normal appearing nose.small bore ND tube left nostril,  Oropharyngeal mucus membranes moist & pink.  Neck: No lymphadenopathy.  Cardiovascular: Regular rate and rhythm. No murmur.  Left arm PICC line site with mild swelling, ecchymosis which remains unchanged from prior with some itchiness and pain, no obvious erythema  Extremities: Warm to touch. Regular 2+ radial pulses bilaterally. No LE edema.  Respiratory: Normal respiratory effort on room air Clear to auscultation bilaterally.  Gastrointestinal: Soft, non-tender, non-distended with normoactive bowel sounds.  Neurologic: Alert and fully oriented. Normal speech and language. CAM negative. Moving all extremities purposefully. Normal gait.  Skin: Skin is warm, dry and intact. No rash.  Psychiatric:  Mood and affect are normal. Speech and behavior are normal.

## 2023-04-06 NOTE — Unmapped (Deleted)
PICC Troubleshooting Note    VAT requested to assess  pt picc line  , as pt is c/o tenderness and swelling in Lt upper arm.   Lt upper arm is ecchymotic medially to picc site, tender to touch, No change in arm circumference 25cm    VAT to bedside to assess line.  Site has been assessed by VAT.  Recommendation at this time is PVL's.    Please notify VAT if any additional assistance is needed.    This Secure chat sent to Dr. Danie Binder :  pt has a picc line that was placed 03-29-23. Today pt states she has tenderness and swelling around the picc insertion site. Her Lt upper arm medial to the picc site is ecchymotic, I think that is from the original unsuccessful needle stick for picc insertion. Her arm is mildly swollen. I would recommend PVL's to check for DVT. Pt states she has had a DVT from a picc line in the past. Thank you Juel Burrow VAT RN   Richardine Service, RN Venous Access Team, [phone]       Workup Time:  30 minutes

## 2023-04-06 NOTE — Unmapped (Signed)
Problem: Adult Inpatient Plan of Care  Goal: Plan of Care Review  Outcome: Progressing  Goal: Patient-Specific Goal (Individualized)  Outcome: Progressing  Goal: Absence of Hospital-Acquired Illness or Injury  Outcome: Progressing  Intervention: Identify and Manage Fall Risk  Recent Flowsheet Documentation  Taken 04/05/2023 1600 by Alcide Evener, RN  Safety Interventions:   fall reduction program maintained   lighting adjusted for tasks/safety   low bed   nonskid shoes/slippers when out of bed  Taken 04/05/2023 1400 by Alcide Evener, RN  Safety Interventions:   fall reduction program maintained   lighting adjusted for tasks/safety   low bed   nonskid shoes/slippers when out of bed  Taken 04/05/2023 1200 by Alcide Evener, RN  Safety Interventions:   fall reduction program maintained   lighting adjusted for tasks/safety   low bed   nonskid shoes/slippers when out of bed  Taken 04/05/2023 1000 by Alcide Evener, RN  Safety Interventions:   fall reduction program maintained   lighting adjusted for tasks/safety   low bed   nonskid shoes/slippers when out of bed  Taken 04/05/2023 0800 by Alcide Evener, RN  Safety Interventions:   fall reduction program maintained   lighting adjusted for tasks/safety   low bed   nonskid shoes/slippers when out of bed  Intervention: Prevent Skin Injury  Recent Flowsheet Documentation  Taken 04/05/2023 1600 by Alcide Evener, RN  Device Skin Pressure Protection: absorbent pad utilized/changed  Skin Protection:   adhesive use limited   incontinence pads utilized  Taken 04/05/2023 1400 by Alcide Evener, RN  Device Skin Pressure Protection: absorbent pad utilized/changed  Skin Protection:   adhesive use limited   incontinence pads utilized  Taken 04/05/2023 1200 by Alcide Evener, RN  Device Skin Pressure Protection: absorbent pad utilized/changed  Skin Protection:   adhesive use limited   incontinence pads utilized  Taken 04/05/2023 1000 by Alcide Evener, RN  Device Skin Pressure Protection: absorbent pad utilized/changed  Skin Protection:   adhesive use limited   incontinence pads utilized  Taken 04/05/2023 0800 by Alcide Evener, RN  Device Skin Pressure Protection: absorbent pad utilized/changed  Skin Protection:   adhesive use limited   incontinence pads utilized  Intervention: Prevent Infection  Recent Flowsheet Documentation  Taken 04/05/2023 1600 by Alcide Evener, RN  Infection Prevention:   hand hygiene promoted   rest/sleep promoted  Taken 04/05/2023 1400 by Alcide Evener, RN  Infection Prevention:   hand hygiene promoted   rest/sleep promoted  Taken 04/05/2023 1200 by Alcide Evener, RN  Infection Prevention:   hand hygiene promoted   rest/sleep promoted  Taken 04/05/2023 1000 by Alcide Evener, RN  Infection Prevention:   hand hygiene promoted   rest/sleep promoted  Taken 04/05/2023 0800 by Alcide Evener, RN  Infection Prevention:   hand hygiene promoted   rest/sleep promoted  Goal: Optimal Comfort and Wellbeing  Outcome: Progressing  Goal: Readiness for Transition of Care  Outcome: Progressing  Goal: Rounds/Family Conference  Outcome: Progressing     Problem: Infection  Goal: Absence of Infection Signs and Symptoms  Outcome: Progressing  Intervention: Prevent or Manage Infection  Recent Flowsheet Documentation  Taken 04/05/2023 1600 by Alcide Evener, RN  Infection Management: aseptic technique maintained  Taken 04/05/2023 1400 by Alcide Evener, RN  Infection Management: aseptic technique maintained  Taken 04/05/2023 1200 by Alcide Evener, RN  Infection Management: aseptic technique maintained  Taken 04/05/2023 1000 by Alcide Evener, RN  Infection Management: aseptic technique maintained  Taken 04/05/2023 0800 by Alcide Evener, RN  Infection Management: aseptic technique maintained     Problem: Latex Allergy  Goal: Absence of Allergy Symptoms  Outcome: Progressing     Problem: Oncology Care  Goal: Effective Coping  Outcome: Progressing  Goal: Improved Activity Tolerance  Outcome: Progressing  Goal: Optimal Oral Intake  Outcome: Progressing  Goal: Improved Oral Mucous Membrane Integrity  Outcome: Progressing  Intervention: Promote Oral Comfort and Health  Recent Flowsheet Documentation  Taken 04/05/2023 1600 by Alcide Evener, RN  Oral Mucous Membrane Protection:   nonirritating oral fluids promoted   nonirritating oral foods promoted  Taken 04/05/2023 1400 by Alcide Evener, RN  Oral Mucous Membrane Protection:   nonirritating oral fluids promoted   nonirritating oral foods promoted  Taken 04/05/2023 1200 by Alcide Evener, RN  Oral Mucous Membrane Protection:   nonirritating oral fluids promoted   nonirritating oral foods promoted  Taken 04/05/2023 1000 by Alcide Evener, RN  Oral Mucous Membrane Protection:   nonirritating oral fluids promoted   nonirritating oral foods promoted  Taken 04/05/2023 0800 by Alcide Evener, RN  Oral Mucous Membrane Protection:   nonirritating oral fluids promoted   nonirritating oral foods promoted  Goal: Optimal Pain Control and Function  Outcome: Progressing     Problem: Self-Care Deficit  Goal: Improved Ability to Complete Activities of Daily Living  Outcome: Progressing     Problem: Fall Injury Risk  Goal: Absence of Fall and Fall-Related Injury  Outcome: Progressing  Intervention: Promote Injury-Free Environment  Recent Flowsheet Documentation  Taken 04/05/2023 1600 by Alcide Evener, RN  Safety Interventions:   fall reduction program maintained   lighting adjusted for tasks/safety   low bed   nonskid shoes/slippers when out of bed  Taken 04/05/2023 1400 by Alcide Evener, RN  Safety Interventions:   fall reduction program maintained   lighting adjusted for tasks/safety   low bed   nonskid shoes/slippers when out of bed  Taken 04/05/2023 1200 by Alcide Evener, RN  Safety Interventions:   fall reduction program maintained lighting adjusted for tasks/safety   low bed   nonskid shoes/slippers when out of bed  Taken 04/05/2023 1000 by Alcide Evener, RN  Safety Interventions:   fall reduction program maintained   lighting adjusted for tasks/safety   low bed   nonskid shoes/slippers when out of bed  Taken 04/05/2023 0800 by Alcide Evener, RN  Safety Interventions:   fall reduction program maintained   lighting adjusted for tasks/safety   low bed   nonskid shoes/slippers when out of bed     Problem: Skin Injury Risk Increased  Goal: Skin Health and Integrity  Outcome: Progressing  Intervention: Optimize Skin Protection  Recent Flowsheet Documentation  Taken 04/05/2023 1600 by Alcide Evener, RN  Pressure Reduction Techniques:   frequent weight shift encouraged   heels elevated off bed  Pressure Reduction Devices: pressure-redistributing mattress utilized  Skin Protection:   adhesive use limited   incontinence pads utilized  Taken 04/05/2023 1400 by Alcide Evener, RN  Pressure Reduction Techniques:   frequent weight shift encouraged   heels elevated off bed  Pressure Reduction Devices: pressure-redistributing mattress utilized  Skin Protection:   adhesive use limited   incontinence pads utilized  Taken 04/05/2023 1200 by Alcide Evener, RN  Pressure Reduction Techniques:   frequent weight shift encouraged   heels elevated off bed  Pressure Reduction Devices: pressure-redistributing mattress utilized  Skin Protection:   adhesive use limited   incontinence pads utilized  Taken 04/05/2023 1000 by Alcide Evener, RN  Pressure Reduction Techniques:  frequent weight shift encouraged   heels elevated off bed  Pressure Reduction Devices: pressure-redistributing mattress utilized  Skin Protection:   adhesive use limited   incontinence pads utilized  Taken 04/05/2023 0800 by Alcide Evener, RN  Pressure Reduction Techniques:   frequent weight shift encouraged   heels elevated off bed  Pressure Reduction Devices: pressure-redistributing mattress utilized  Skin Protection:   adhesive use limited   incontinence pads utilized     Problem: Pain Acute  Goal: Optimal Pain Control and Function  Outcome: Progressing

## 2023-04-07 MED ADMIN — promethazine (PHENERGAN) 12.5 mg in sodium chloride (NS) 0.9 % 50 mL IVPB: 12.5 mg | INTRAVENOUS | @ 02:00:00

## 2023-04-07 MED ADMIN — lidocaine (ASPERCREME) 4 % 1 patch: 1 | TRANSDERMAL | @ 15:00:00

## 2023-04-07 MED ADMIN — acetaminophen (TYLENOL) oral liquid: 1000 mg | ENTERAL | @ 10:00:00

## 2023-04-07 MED ADMIN — promethazine (PHENERGAN) 12.5 mg in sodium chloride (NS) 0.9 % 50 mL IVPB: 12.5 mg | INTRAVENOUS | @ 10:00:00

## 2023-04-07 MED ADMIN — acetaminophen (TYLENOL) oral liquid: 1000 mg | ENTERAL | @ 03:00:00

## 2023-04-07 MED ADMIN — HYDROmorphone (PF) (DILAUDID) injection 1 mg: 1 mg | INTRAVENOUS | @ 14:00:00 | Stop: 2023-04-11

## 2023-04-07 MED ADMIN — oxyCODONE (ROXICODONE) 5 mg/5 mL solution 20 mg: 20 mg | ORAL | @ 03:00:00 | Stop: 2023-04-09

## 2023-04-07 MED ADMIN — OLANZapine zydis (ZYPREXA) disintegrating tablet 5 mg: 5 mg | ORAL | @ 02:00:00

## 2023-04-07 MED ADMIN — promethazine (PHENERGAN) 12.5 mg in sodium chloride (NS) 0.9 % 50 mL IVPB: 12.5 mg | INTRAVENOUS | @ 23:00:00

## 2023-04-07 MED ADMIN — escitalopram (LEXAPRO) oral solution: 5 mg | ENTERAL | @ 20:00:00

## 2023-04-07 MED ADMIN — sodium chloride (NS) 0.9 % flush 10 mL: 10 mL | INTRAVENOUS | @ 19:00:00

## 2023-04-07 MED ADMIN — enoxaparin (LOVENOX) syringe 50 mg: 1 mg/kg | SUBCUTANEOUS | @ 17:00:00

## 2023-04-07 MED ADMIN — pantoprazole (Protonix) injection 40 mg: 40 mg | INTRAVENOUS | @ 02:00:00

## 2023-04-07 MED ADMIN — promethazine (PHENERGAN) 12.5 mg in sodium chloride (NS) 0.9 % 50 mL IVPB: 12.5 mg | INTRAVENOUS | @ 17:00:00

## 2023-04-07 MED ADMIN — ondansetron (ZOFRAN) injection 8 mg: 8 mg | INTRAVENOUS | @ 19:00:00

## 2023-04-07 MED ADMIN — cefdinir (OMNICEF) oral suspension: 300 mg | ORAL | @ 02:00:00 | Stop: 2023-04-08

## 2023-04-07 MED ADMIN — sodium chloride (NS) 0.9 % flush 10 mL: 10 mL | INTRAVENOUS | @ 02:00:00

## 2023-04-07 MED ADMIN — scopolamine (TRANSDERM-SCOP) 1 mg over 3 days topical patch 1 mg: 1 | TOPICAL | @ 15:00:00

## 2023-04-07 MED ADMIN — oxyCODONE (ROXICODONE) 5 mg/5 mL solution 20 mg: 20 mg | ORAL | @ 17:00:00 | Stop: 2023-04-09

## 2023-04-07 MED ADMIN — cefdinir (OMNICEF) oral suspension: 300 mg | ORAL | @ 20:00:00 | Stop: 2023-04-08

## 2023-04-07 MED ADMIN — enoxaparin (LOVENOX) syringe 50 mg: 1 mg/kg | SUBCUTANEOUS | @ 06:00:00

## 2023-04-07 MED ADMIN — oxyCODONE (ROXICODONE) 5 mg/5 mL solution 20 mg: 20 mg | ORAL | @ 10:00:00 | Stop: 2023-04-09

## 2023-04-07 MED ADMIN — HYDROmorphone (PF) (DILAUDID) injection 1 mg: 1 mg | INTRAVENOUS | @ 05:00:00 | Stop: 2023-04-11

## 2023-04-07 MED ADMIN — HYDROmorphone (PF) (DILAUDID) injection 1 mg: 1 mg | INTRAVENOUS | @ 19:00:00 | Stop: 2023-04-11

## 2023-04-07 MED ADMIN — sodium chloride (NS) 0.9 % flush 10 mL: 10 mL | INTRAVENOUS | @ 11:00:00

## 2023-04-07 MED ADMIN — famotidine (PEPCID) oral suspension: 20 mg | ENTERAL | @ 02:00:00

## 2023-04-07 MED ADMIN — pantoprazole (Protonix) injection 40 mg: 40 mg | INTRAVENOUS | @ 15:00:00

## 2023-04-07 MED ADMIN — ondansetron (ZOFRAN) injection 8 mg: 8 mg | INTRAVENOUS | @ 11:00:00

## 2023-04-07 MED ADMIN — ondansetron (ZOFRAN) injection 8 mg: 8 mg | INTRAVENOUS | @ 02:00:00

## 2023-04-07 MED ADMIN — LORazepam (ATIVAN) tablet 1 mg: 1 mg | ORAL | @ 20:00:00

## 2023-04-07 MED ADMIN — acetaminophen (TYLENOL) oral liquid: 1000 mg | ENTERAL | @ 20:00:00

## 2023-04-07 NOTE — Unmapped (Signed)
VAT consulted to pull PICC line because it is no longer needed. However, there is a known clot around the PICC. Team will have to pull the line.     Niaya Hickok Donnalee Curry, VAT  [phone]

## 2023-04-07 NOTE — Unmapped (Signed)
Department of Anesthesiology  Pain Medicine Division    Chronic Pain inpatient Consult follow up Note      Requesting Attending Physician:  Joesph July, FNP  Service Requesting Consult:  Med Bernita Raisin [facility])    Assessment/Recommendations:  The patient was seen in consultation on request of Joesph July, FNP regarding assistance with pain management. The patient is not obtaining adequate pain relief on current medication regimen.     [name] [name] is a 23 year old female with a complex medical history, including DVT (RUE), [name] syndrome (post-colectomy), desmoid tumors, anemia, and severe protein-calorie malnutrition. She is followed at [facility] for chronic abdominal and right shoulder pain, consistent with cancer-associated pain. 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 Nathalia???s pain has worsened in recent months, likely due to intermittent intestinal obstructions related to her mesenteric desmoid tumors, which may cause intermittent bowel intussusception. Her pain is further complicated by narcotic bowel syndrome, visceral hyperalgesia, and a positive feedback loop where increased opioid use exacerbates the pain. Christeen???s previous hospitalization was complicated by ileus, requiring NG tube, a prolonged inpatient rehab stay.  During her most recent hospitalization she was found to have some pouchitis and was continued on oral antibiotic.      Interval: Patient doing okay this morning, but reports an overall rough night.  She endorses increased abdominal pain related to new onset maroon stools, up titration of her tube feeds to goal, and LUE PICC site which she was found to have a DVT on PVLs yesterday.  Prior to last night she felt like she was on a good path and making persistent progress with regards to her pain control, and is hopeful that today will just be a small setback. Agreeable to continue with current pain regimen.  Having consistent bowel movements, unable to take much p.o. secondary to pain.     PRN medications: Oxycodone liquid 28m mg Q4H (used q4h), HM IV 1mg  Q4H (used q4h) last 24 hours    Recommendations:  -The chronic pain service is a consult service and does not place orders, just makes recommendations (except ketamine and lidocaine infusions)  -Please evaluate all patients on opioids for appropriateness of prescribing narcan at discharge.  The chronic pain service can assist with this.  Nasal narcan covered by most insurance.  -Recommendations given apply to the current hospitalization and do not reflect long term recommendations.     Recommended to   -Continue Butrans patch 20 mcg/h q. Weekly  -Aggressively address nausea symptoms per primary team choice  -Continue to support patient from multidisciplinary approach   -Continue oxycodone liquid 20 mg every 4 hours as needed (1st line therapy for severe pain), please encourage patient to utilize this modality, unless she has intractable vomiting despite antiemetic  -Continue hydromorphone 1 mg IV every 4 hours as needed (2nd line therapy for severe pain). (2nd line)  -Continue Tylenol 1 g 3 times daily    We will continue to follow her. Chronic pain service will be available to contact via pager.     Please prescribe naloxone at discharge.  Nasal narcan for most insured (Nasal narcan 4mg /actuation, prescribe 1 kit, instructions at [email or web address]).  For uninsured, chronic pain can work to assist in finding an option.  OTC nasal narcan now available at most pharmacies for around $45.    Allergies    Allergies   Allergen Reactions    Adhesive Tape-Silicones  Hives and Rash     Paper tape and Tegederm ok. Biopatch causes blistering but has tolerated CHG Tegaderm    Ferrlecit [Sodium Ferric Gluconat-Sucrose] Swelling and Rash    Levofloxacin Swelling and Rash     Swelling in mouth, rash,     Methylnaltrexone      Per Patient: I lost bowel control, severe abdominal cramping, and elevated BP    Neomycin Swelling     Rxn after ear drops; ear swelling    Papaya Hives    Morphine Nausea And Vomiting    Zosyn [Piperacillin-Tazobactam] Itching and Rash     Red and itchy    Compazine [Prochlorperazine] Other (See Comments)     Extreme agitation    Iron Analogues     Reglan [Metoclopramide Hcl] Diarrhea    Iron Dextran Itching     Received iron dextran 06/08/22 over 12 hours, had itching and redness/flushing during the infusion and for a couple days after. Required IV benadryl w/flares between doses and ultimately treated w/IV methylpred for 2 days.     Latex, Natural Rubber Rash       Home Medications    Medications Prior to Admission   Medication Sig Dispense Refill Last Dose/Taking    acetaminophen (TYLENOL) 500 MG tablet Take 2 tablets (1,000 mg total) by mouth every eight (8) hours.       buprenorphine (BUTRANS) 20 mcg/hour PTWK transdermal patch Place 1 patch on the skin every seven (7) days. 4 patch 1     cefdinir (OMNICEF) 300 MG capsule Take 1 capsule (300 mg total) by mouth every twelve (12) hours for 19 days, THEN 1 capsule (300 mg total) daily for 28 days. 66 capsule 0     COURIERED MED OR SUPPLY OGSVEO 50 MG TABLET. 1 each 0     escitalopram oxalate (LEXAPRO) 5 MG tablet Take 1 tablet (5 mg total) by mouth daily. 30 tablet 0     famotidine (PEPCID) 20 MG tablet Take 1 tablet (20 mg total) by mouth nightly. 30 tablet 3     fluticasone propionate (FLONASE) 50 mcg/actuation nasal spray Use 1 spray under the butrans patch once every 7 days to prevent allergic reaction to the adhesive       hydrocortisone 2.5 % cream Apply topically two (2) times a day as needed (hemorrhoids). 30 g 0     lidocaine (ASPERCREME) 4 % patch Place 1 patch on the skin daily as needed.       LORazepam (ATIVAN) 1 MG tablet Take 1 tablet (1 mg total) by mouth two (2) times a day as needed for anxiety. 60 tablet 0     naloxegol (MOVANTIK) 12.5 mg tablet Take 1 tablet (12.5 mg total) by mouth daily. (Patient taking differently: Take 1 tablet (12.5 mg total) by mouth daily as needed.) 30 tablet 0     naloxone (NARCAN) 4 mg nasal spray One spray in either nostril once for known/suspected opioid overdose. May repeat every 2-3 minutes in alternating nostril til EMS arrives 2 each 0     ondansetron (ZOFRAN-ODT) 8 MG disintegrating tablet Dissolve 1 tablet (8 mg total) in the mouth every eight (8) hours as needed for nausea. 90 tablet 0     [EXPIRED] oxyCODONE (ROXICODONE) 5 mg/5 mL solution Take 20 mL (20 mg total) by mouth every four (4) hours as needed for up to 7 days. 840 mL 0     pantoprazole (PROTONIX) 40 MG tablet Take 1 tablet (40 mg total)  by mouth daily before breakfast. 30 tablet 0     promethazine (PHENERGAN) 12.5 MG tablet Take 1 tablet (12.5 mg total) by mouth every twelve (12) hours as needed for nausea. 14 tablet 0     scopolamine (TRANSDERM-SCOP) 1 mg over 3 days Place 1 patch (1 mg total) on the skin every third day. 10 patch 2     sucralfate (CARAFATE) 100 mg/mL suspension Take 10 mL (1 g total) by mouth four (4) times a day as needed.       zinc oxide-cod liver oil (DESITIN 40%) 40 % Pste Apply topically daily as needed. 57 g 0        Inpatient Medications  Current Facility-Administered Medications   Medication Dose Route Frequency Provider Last Rate Last Admin    acetaminophen (TYLENOL) oral liquid  1,000 mg Enteral tube: post-pyloric (duodenum, jejunum) Q8H SCH [name], MD   1,000 mg at 04/07/23 0520    ammonium lactate (LAC-HYDRIN) 12 % lotion 1 Application  1 Application Topical BID [name], MD   1 Application at 04/06/23 1745    buprenorphine 20 mcg/hour transdermal patch 1 patch  1 patch Transdermal Weekly Dholakia, Vibhaben, AGNP   1 patch at 04/01/23 1011    cefdinir (OMNICEF) oral suspension  300 mg Oral BID Dolores Patty, MD   300 mg at 04/06/23 2117    Followed by    Melene Muller ON 04/08/2023] cefdinir (OMNICEF) oral suspension  300 mg Oral Daily Dolores Patty, MD        cetirizine (ZYRTEC) oral syrup  20 mg Enteral tube: post-pyloric (duodenum, jejunum) BID [name], MD   20 mg at 04/04/23 0108    [Provider Hold] cholecalciferol (vitamin D3 25 mcg (1,000 units)) tablet 25 mcg  25 mcg Oral Daily Dholakia, Vibhaben, AGNP        enoxaparin (LOVENOX) syringe 50 mg  1 mg/kg Subcutaneous Q12H SCH Dholakia, Vibhaben, AGNP   50 mg at 04/07/23 0125    escitalopram (LEXAPRO) oral solution  5 mg Enteral tube: post-pyloric (duodenum, jejunum) Daily [name], MD   5 mg at 04/06/23 0850    famotidine (PEPCID) oral suspension  20 mg Enteral tube: post-pyloric (duodenum, jejunum) Nightly [name], MD   20 mg at 04/06/23 2117    fluticasone propionate (FLONASE) 50 mcg/actuation nasal spray 1 spray  1 spray Topical Weekly Dholakia, Vibhaben, AGNP   1 spray at 04/01/23 1011    guar gum (NUTRISOURCE) 1 packet  1 packet Enteral tube: post-pyloric (duodenum, jejunum) TID Dolores Patty, MD   1 packet at 04/05/23 2056    hydrocortisone 2.5 % cream   Topical BID PRN Dholakia, Vibhaben, AGNP        HYDROmorphone (PF) (DILAUDID) injection 1 mg  1 mg Intravenous Q4H PRN [name], MD   1 mg at 04/06/23 2345    hyoscyamine (LEVSIN) tablet 125 mcg  125 mcg Oral Q4H PRN Dolores Patty, MD        IP OKAY TO TREAT   Other Continuous PRN Dholakia, Vibhaben, AGNP        IP OKAY TO TREAT   Other Continuous PRN Dholakia, Vibhaben, AGNP        IP OKAY TO TREAT   Other Continuous PRN Dholakia, Vibhaben, AGNP        lidocaine (ASPERCREME) 4 % 1 patch  1 patch Transdermal Daily Dholakia, Vibhaben, AGNP   1 patch at 04/06/23 [phone]  loperamide (IMODIUM) oral solution  2 mg Oral QID PRN Dolores Patty, MD        LORazepam (ATIVAN) tablet 1 mg  1 mg Oral BID PRN Joesph July, FNP   1 mg at 04/05/23 0559    melatonin tablet 3 mg  3 mg Oral Nightly PRN [name], MD        mirtazapine (REMERON) tablet 7.5 mg  7.5 mg Oral Nightly [name], MD        multivitamin with folic acid 400 mcg tablet 1 tablet  1 tablet Oral Daily [name], MD        naloxegol (MOVANTIK) 12.5 mg tablet 12.5 mg  12.5 mg Oral Daily PRN Dholakia, Vibhaben, AGNP        OLANZapine zydis (ZYPREXA) disintegrating tablet 5 mg  5 mg Oral Q PM Dolores Patty, MD   5 mg at 04/06/23 2126    ondansetron [facility]) injection 8 mg  8 mg Intravenous Q8H Dholakia, Vibhaben, AGNP   8 mg at 04/07/23 0542    oxyCODONE (ROXICODONE) 5 mg/5 mL solution 20 mg  20 mg Oral Q4H PRN [name], MD   20 mg at 04/07/23 0520    pantoprazole (Protonix) injection 40 mg  40 mg Intravenous BID Dolores Patty, MD   40 mg at 04/06/23 2116    promethazine (PHENERGAN) 12.5 mg in sodium chloride (NS) 0.9 % 50 mL IVPB  12.5 mg Intravenous Q6H [name], MD   Stopped at 04/07/23 0543    scopolamine (TRANSDERM-SCOP) 1 mg over 3 days topical patch 1 mg  1 patch Topical Q72H Dholakia, Vibhaben, AGNP   1 mg at 04/04/23 7425    sodium chloride (NS) 0.9 % flush 10 mL  10 mL Intravenous Q8H [name], MD   10 mL at 04/07/23 0542    [Provider Hold] thiamine mononitrate (vit B1) tablet 200 mg  200 mg Oral Daily [name], MD        zinc oxide-cod liver oil (DESITIN 40%) Paste   Topical Daily PRN Dolores Patty, MD             Past Medical History    Past Medical History:   Diagnosis Date    Abdominal pain     Acid reflux     occas    Anesthesia complication     itching, shaking, coldness; last few surgeries have gone much better    Cancer (CMS-HCC)     Cataract of right eye     COVID-19 virus infection 01/2019    Cyst of thyroid determined by ultrasound     monitoring    [name]     2 right forearm, 1 left thigh, 1 right scapula, 1 under left clavicle; multiple    Difficult intravenous access     [name] (familial adenomatous polyposis)     [name]     Gastric polyps     History of chemotherapy     last treatment approx 05/2019    History of colon polyps     History of COVID-19 01/2019    Ileus (CMS-HCC) 03/16/2022    Iron deficiency anemia due to chronic blood loss     received iron infusion 11-2019    PONV (postoperative nausea and vomiting)     Rectal bleeding     Syncopal episodes     especially if becoming dehydrated  Past Surgical History    Past Surgical History:   Procedure Laterality Date    COLON SURGERY      cyroablation      cystis removal      desmoid removal      PR CLOSE ENTEROSTOMY,RESEC+ANAST N/A 10/09/2020    Procedure: ILEOSTOMY TAKEDOWN;  Surgeon: Mickle Asper, MD;  Location: OR [place];  Service: General Surgery    PR COLONOSCOPY W/BIOPSY SINGLE/MULTIPLE N/A 10/27/2012    Procedure: COLONOSCOPY, FLEXIBLE, PROXIMAL TO SPLENIC FLEXURE; WITH BIOPSY, SINGLE OR MULTIPLE;  Surgeon: Shirlyn Goltz Mir, MD;  Location: PEDS PROCEDURE ROOM [facility];  Service: Gastroenterology    PR COLONOSCOPY W/BIOPSY SINGLE/MULTIPLE N/A 09/14/2013    Procedure: COLONOSCOPY, FLEXIBLE, PROXIMAL TO SPLENIC FLEXURE; WITH BIOPSY, SINGLE OR MULTIPLE;  Surgeon: Shirlyn Goltz Mir, MD;  Location: PEDS PROCEDURE ROOM [facility];  Service: Gastroenterology    PR COLONOSCOPY W/BIOPSY SINGLE/MULTIPLE N/A 11/08/2014    Procedure: COLONOSCOPY, FLEXIBLE, PROXIMAL TO SPLENIC FLEXURE; WITH BIOPSY, SINGLE OR MULTIPLE;  Surgeon: Arnold Long Mir, MD;  Location: PEDS PROCEDURE ROOM [facility];  Service: Gastroenterology    PR COLONOSCOPY W/BIOPSY SINGLE/MULTIPLE N/A 12/26/2015    Procedure: COLONOSCOPY, FLEXIBLE, PROXIMAL TO SPLENIC FLEXURE; WITH BIOPSY, SINGLE OR MULTIPLE;  Surgeon: Arnold Long Mir, MD;  Location: PEDS PROCEDURE ROOM [facility];  Service: Gastroenterology    PR COLONOSCOPY W/BIOPSY SINGLE/MULTIPLE N/A 09/02/2017    Procedure: COLONOSCOPY, FLEXIBLE, PROXIMAL TO SPLENIC FLEXURE; WITH BIOPSY, SINGLE OR MULTIPLE;  Surgeon: Arnold Long Mir, MD;  Location: PEDS PROCEDURE ROOM [place];  Service: Gastroenterology    PR COLSC FLX W/REMOVAL LESION BY HOT BX FORCEPS N/A 08/27/2016    Procedure: COLONOSCOPY, FLEXIBLE, PROXIMAL TO SPLENIC FLEXURE; W/REMOVAL TUMOR/POLYP/OTHER LESION, HOT BX FORCEP/CAUTE;  Surgeon: Arnold Long Mir, MD;  Location: PEDS PROCEDURE ROOM [facility];  Service: Gastroenterology    PR COLSC FLX W/RMVL OF TUMOR POLYP LESION SNARE TQ N/A 02/25/2019    Procedure: COLONOSCOPY FLEX; W/REMOV TUMOR/LES BY SNARE;  Surgeon: Helyn Numbers, MD;  Location: [facility] PROCEDURES [place] [facility];  Service: Gastroenterology    PR COLSC FLX W/RMVL OF TUMOR POLYP LESION SNARE TQ N/A 03/13/2020    Procedure: COLONOSCOPY FLEX; W/REMOV TUMOR/LES BY SNARE;  Surgeon: Helyn Numbers, MD;  Location: [facility]EDURES [place] [facility];  Service: Gastroenterology    PR EXC SKIN BENIG 2.1-3 CM TRUNK,ARM,LEG Right 02/25/2017    Procedure: EXCISION, BENIGN LESION INCLUDE MARGINS, EXCEPT SKIN TAG, LEGS; EXCISED DIAMETER 2.1 TO 3.0 CM;  Surgeon: Clarene Duke, MD;  Location: CHILDRENS OR [facility];  Service: Plastics    PR EXC SKIN BENIG 3.1-4 CM TRUNK,ARM,LEG Right 02/25/2017    Procedure: EXCISION, BENIGN LESION INCLUDE MARGINS, EXCEPT SKIN TAG, ARMS; EXCISED DIAMETER 3.1 TO 4.0 CM;  Surgeon: Clarene Duke, MD;  Location: CHILDRENS OR [facility];  Service: Plastics    PR EXC SKIN BENIG >4 CM FACE,FACIAL Right 02/25/2017    Procedure: EXCISION, OTHER BENIGN LES INCLUD MARGIN, FACE/EARS/EYELIDS/NOSE/LIPS/MUCOUS MEMBRANE; EXCISED DIAM >4.0 CM;  Surgeon: Clarene Duke, MD;  Location: CHILDRENS OR [facility];  Service: Plastics    PR EXC TUMOR SOFT TISSUE LEG/ANKLE SUBQ 3+CM Right 08/05/2019    Procedure: EXCISION, TUMOR, SOFT TISSUE OF LEG OR ANKLE AREA, SUBCUTANEOUS; 3 CM OR GREATER;  Surgeon: Arsenio Katz, MD;  Location: MAIN OR [place];  Service: Plastics    PR EXC TUMOR SOFT TISSUE LEG/ANKLE SUBQ <3CM Right 08/05/2019    Procedure: EXCISION, TUMOR, SOFT TISSUE OF LEG OR ANKLE AREA, SUBCUTANEOUS; LESS THAN 3 CM;  Surgeon: Victorino Dike  Luna Glasgow, MD;  Location: MAIN OR [facility];  Service: Plastics    PR LAP, SURG PROCTECTOMY W J-POUCH N/A 08/10/2020    Procedure: ROBOTIC ASSISTED LAPAROSCOPIC PROCTOCOLECTOMY, ILEAL [place], WITH OSTOMY;  Surgeon: Mickle Asper, MD;  Location: OR [place];  Service: General Surgery    PR NDSC EVAL INTSTINAL POUCH DX W/COLLJ SPEC SPX N/A 01/23/2021    Procedure: ENDO EVAL SM INTEST POUCH; DX;  Surgeon: Modena Nunnery, MD;  Location: [facility]EDURES [place] [facility]  Service: Gastroenterology    PR NDSC EVAL INTSTINAL POUCH DX W/COLLJ SPEC SPX N/A 08/27/2021    Procedure: ENDO EVAL SM INTEST POUCH; DX;  Surgeon: Hunt Oris, MD;  [facility]URES [facility];  Service: Gastroenterology    PR NDSC EVAL INTSTINAL POUCH DX W/COLLJ SPEC SPX N/A 12/09/2021    Procedure: ENDO EVAL SM INTEST POUCH; DX;  Surgeon: Vidal Schwalbe, MD;  Location: GI PROCEDURES [facility];  Service: Gastroenterology    PR NDSC EVAL INTSTINAL POUCH DX W/COLLJ [facility] SPX Left 04/09/2022    Procedure: ENDO EVAL SM INTEST POUCH; DX;  Surgeon: Modena Nunnery, MD;  Location: [place] [facility]ED[facility] [place] [facility];  Service: Gastroenterology    PR NDSC EVAL INTSTINAL POUCH DX W/COLLJ SPEC SPX N/A 08/05/2022    Procedure: ENDO EVAL SM INTEST POUCH; DX;  Surgeon: Modena Nunnery, MD;  Location: [facility] PROC[facility]URES [facility];  Service: Gastroenterology    PR NDSC EVAL INTSTINAL POUCH DX W/COLLJ SPEC SPX N/A 03/13/2023    Procedure: ENDO EVAL SM INTEST POUCH; DX;  Surgeon: Carmon Ginsberg, MD;  Location: GI PROCEDURES [facility];  Service: Gastroenterology    PR NDSC EVAL INTSTINAL POUCH W/BX SINGLE/MULTIPLE N/A 01/20/2022    Procedure: ENDOSCOPIC EVAL OF SMALL INTESTINAL POUCH; DIAGNOSTIC, No biopsies;  Surgeon: Andrey Farmer, MD;  Location: GI PROCEDURES [facility];  Service: Gastroenterology    PR NDSC EVAL INTSTINAL POUCH W/BX SINGLE/MULTIPLE N/A 02/13/2022    Procedure: ENDOSCOPIC EVAL OF SMALL INTESTINAL POUCH; DIAGNOSTIC, WITH BIOPSY; Surgeon: Bronson Curb, MD;  Location: GI PROCEDURES [facility];  Service: Gastroenterology    PR NDSC EVAL INTSTINAL POUCH W/BX SINGLE/MULTIPLE N/A 03/13/2023    Procedure: ENDOSCOPIC EVAL OF SMALL INTESTINAL POUCH; DIAGNOSTIC, WITH BIOPSY;  Surgeon: Carmon Ginsberg, MD;  Location: GI PROCEDURES [facility];  Service: Gastroenterology    PR UNLISTED PROCEDURE SMALL INTESTINE  01/23/2021    Procedure: UNLISTED PROCEDURE, SMALL INTESTINE;  Surgeon: Modena Nunnery, MD;  Location: [facility] PROCEDURES [place] [facility];  Service: Gastroenterology    PR UNLISTED PROCEDURE SMALL INTESTINE  02/13/2022    Procedure: UNLISTED PROCEDURE, SMALL INTESTINE;  Surgeon: Bronson Curb, MD;  Location: GI PROCEDURES [facility];  Service: Gastroenterology    PR UPPER GI ENDOSCOPY,BIOPSY N/A 10/27/2012    Procedure: UGI ENDOSCOPY; WITH BIOPSY, SINGLE OR MULTIPLE;  Surgeon: Shirlyn Goltz Mir, MD;  Location: PEDS PROCEDURE ROOM [facility];  Service: Gastroenterology    PR UPPER GI ENDOSCOPY,BIOPSY N/A 09/14/2013    Procedure: UGI ENDOSCOPY; WITH BIOPSY, SINGLE OR MULTIPLE;  Surgeon: Shirlyn Goltz Mir, MD;  Location: PEDS PROCEDURE ROOM [facility];  Service: Gastroenterology    PR UPPER GI ENDOSCOPY,BIOPSY N/A 11/08/2014    Procedure: UGI ENDOSCOPY; WITH BIOPSY, SINGLE OR MULTIPLE;  Surgeon: Arnold Long Mir, MD;  Location: PEDS PROCEDURE ROOM [facility];  Service: Gastroenterology    PR UPPER GI ENDOSCOPY,BIOPSY N/A 12/26/2015    Procedure: UGI ENDOSCOPY; WITH BIOPSY, SINGLE OR MULTIPLE;  Surgeon: Arnold Long Mir, MD;  Location: PEDS PROCEDURE ROOM [facility];  Service: Gastroenterology    PR UPPER GI ENDOSCOPY,BIOPSY N/A 08/27/2016    Procedure: UGI ENDOSCOPY; WITH BIOPSY, SINGLE OR MULTIPLE;  Surgeon: Arnold Long Mir, MD;  Location: PEDS PROCEDURE ROOM [facility];  Service: Gastroenterology    PR UPPER GI ENDOSCOPY,BIOPSY N/A 09/02/2017    Procedure: UGI ENDOSCOPY; WITH BIOPSY, SINGLE OR MULTIPLE;  Surgeon: Arnold Long Mir, MD;  Location: PEDS PROCEDURE ROOM [facility];  Service: Gastroenterology    PR UPPER GI ENDOSCOPY,BIOPSY N/A 03/13/2020    Procedure: UGI ENDOSCOPY; WITH BIOPSY, SINGLE OR MULTIPLE;  Surgeon: Helyn Numbers, MD;  Location: GI PROCEDURES [place] [facility];  Service: Gastroenterology    PR UPPER GI ENDOSCOPY,BIOPSY N/A 09/05/2021    Procedure: UGI ENDOSCOPY; WITH BIOPSY, SINGLE OR MULTIPLE;  Surgeon: Wendall Papa, MD;  Location: GI PROCEDURES [facility];  Service: Gastroenterology    PR UPPER GI ENDOSCOPY,DIAGNOSIS N/A 01/20/2022    Procedure: UGI ENDO, INCLUDE ESOPHAGUS, STOMACH, & DUODENUM &/OR JEJUNUM; DX W/WO COLLECTION SPECIMN, BY BRUSH OR WASH;  Surgeon: Andrey Farmer, MD;  Location: GI PROCEDURES [facility];  Service: Gastroenterology    TUMOR REMOVAL      multiple-head, neck, back, hand, right flank, multiple           Family History    Family History   Problem Relation Age of Onset    No Known Problems Mother     No Known Problems Father     No Known Problems Sister     No Known Problems Brother     Stroke Maternal Grandmother     Other Maternal Grandmother         benign lesions of liver and pancreas, further details unknown    Cancer Maternal Grandmother     Diabetes Maternal Grandmother     Hypertension Maternal Grandmother     Thyroid disease Maternal Grandmother     Arthritis Maternal Grandfather     Asthma Maternal Grandfather     COPD Paternal Grandmother         Deceased    Miscarriages / Stillbirths Paternal Grandmother     Alcohol abuse Paternal Grandfather         Deceased    No Known Problems Maternal Aunt     No Known Problems Maternal Uncle     No Known Problems Paternal Aunt     No Known Problems Paternal Uncle     Anesthesia problems Neg Hx     Broken bones Neg Hx     Cancer Neg Hx     Clotting disorder Neg Hx     Collagen disease Neg Hx     Diabetes Neg Hx     Dislocations Neg Hx     Fibromyalgia Neg Hx     Gout Neg Hx     Hemophilia Neg Hx     Osteoporosis Neg Hx     Rheumatologic disease Neg Hx Scoliosis Neg Hx     Severe sprains Neg Hx     Sickle cell anemia Neg Hx     Spinal Compression Fracture Neg Hx     Melanoma Neg Hx     Basal cell carcinoma Neg Hx     Squamous cell carcinoma Neg Hx          Social History:  Social History     Tobacco Use   Smoking Status Never    Passive exposure: Past   Smokeless Tobacco Never     Social History  Substance and Sexual Activity   Alcohol Use Never     Social History     Substance and Sexual Activity   Drug Use Never         Lab Results   Component Value Date    CREATININE 0.48 (L) 04/06/2023       Lab Results   Component Value Date    ALKPHOS 132 (H) 04/06/2023    BILITOT 0.2 (L) 04/06/2023    BILIDIR <0.10 03/17/2023    PROT 6.1 04/06/2023    ALBUMIN 3.2 (L) 04/06/2023    ALT 70 (H) 04/06/2023    AST 21 04/06/2023       Encounter Date: 03/25/23   ECG 12 Lead   Result Value    EKG Systolic BP     EKG Diastolic BP     EKG Ventricular Rate 96    EKG Atrial Rate 96    EKG P-R Interval 134    EKG QRS Duration 78    EKG Q-T Interval 364    EKG QTC Calculation 459    EKG Calculated P Axis 36    EKG Calculated R Axis 40    EKG Calculated T Axis 10    QTC Fredericia 425    Narrative    NORMAL SINUS RHYTHM  NONSPECIFIC T WAVE ABNORMALITY  ABNORMAL ECG  WHEN COMPARED WITH ECG OF 28-Mar-2023 12:13,  NO SIGNIFICANT CHANGE WAS FOUND  Confirmed by Eldred Manges [id number]) on 04/03/2023 6:13:01 PM       No results found for requested labs within last 30 days.       Objective:     Vital Signs  Temp:  [36.4 ??C (97.5 ??F)-36.8 ??C (98.2 ??F)] 36.4 ??C (97.5 ??F)  Heart Rate:  [70-77] 73  Resp:  [15-16] 16  BP: (104-107)/(50-60) 107/55  MAP (mmHg):  [66-73] 70  SpO2:  [96 %-98 %] 98 %    Physical Exam    GENERAL:  Well developed, well-nourished female and is in no apparent distress. Sitting in bed comfortably   HEAD/NECK:    Reveals normocephalic/atraumatic. Corpak tube in place  CARDIOVASCULAR:   Regular rate well perfused  LUNGS:   Normal work of breathing, no supplemental 02  EXTREMITIES:  Warm, no clubbing, cyanosis, or edema was noted.  SKIN:  No obvious rashes lesions or erythema        Problem List    Principal Problem:    Intractable abdominal pain  Active Problems:    [name]    Cancer associated pain    Pain medication agreement signed    Pouchitis (CMS-HCC)    Elevated liver transaminase level    Positive blood culture

## 2023-04-07 NOTE — Unmapped (Signed)
[facility])   Consultation - Liaison (CL) Psychiatry  CL Psychology Follow-Up Note      Service Date:  04/07/23  Admit Date:  03/25/23  Clinician:  Arlana Pouch, PsyD  Intervention: 30 min Individual Psychotherapy  Face-to-Face Clinical Contact with Patient: 20 min  Method of Interaction: In-Person    BACKGROUND INFORMATION AND REASON FOR REFERRAL:   Please see H&P and other recent records for full details. 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 The patient was admitted to?[facility] Hospitalist service for management?of the concerns above. CL Psychology services were requested for evaluation and psychotherapy to address anxiety.    Upon initial evaluation, the patient endorsed recurrent episodes depressive symptoms, including depressed mood, hopelessness, and low self-worth that began when she was approximately 23yo in the context of health-related and interpersonal stressors that is consistent with major depressive disorder. She also endorsed lifelong anxiety about numerous domains that is consistent with generalized anxiety disorder. Additionally, Pt endorsed history of significant medical trauma and associated recurrent nightmares, flashbacks, dissociative episodes, hypervigilance,and avoidance of trauma-related reminders that is concerning for PTSD. Pt is amenable to working with Psychology on techniques to minimize anxiety that may be interfering with care progression during admission.     The patient was seen today for follow-up psychotherapy session to address anxiety.    Interval events:  -Pt found to have DVT at PICC line site on 1/27    ASSESSMENT  IMPRESSIONS/SUMMARY    At follow-up, Pt endorsed emotional response to recent stressors (DVT, hematochezia) that is reasonable and appropriate to circumstances. Supportive interventions provided. Pt actively engaged and receptive to intervention.     DIAGNOSTIC IMPRESSIONS    Major depressive disorder, recurrent episode, moderate  Generalized anxiety disorder  Posttraumatic stress disorder    Risk Assessment:  ASQ screening result: not completed    -A full risk assessment was previously performed on 03/31/23.  Risk assessment remains essentially unchanged.    Current suicide risk: low risk  Current homicide risk: low risk      PLAN  RECOMMENDATIONS    ## Safety and Observation Level:   -This patient is not currently under IVC. If safety concerns arise, please page psychiatry for an evaluation. Recommend routine level of observation per primary team.    ## Follow-up:  - The patient desires ongoing follow-up from CL Psychology while medically inpatient.  - The patient is being followed by Psychiatry.    ## Disposition:  -There are no psychological contraindications to discharging this patient when medically appropriate.   - When this patient is discharged, please ensure that their AVS includes information about the 60 Suicide & Crisis Lifeline.      SUBJECTIVE  SESSION CONTENT     Session began with a brief check-in. Pt reported that she had a difficult night due to discovering DVT at PICC line site, hematochezia, and incontinence. Pt shared she is worried about PICC line being pulled and not having IV access, noting that it is hard for her to tolerate PO medications due to nausea. Pt provided supportive space to process emotional reactions. She voiced ongoing motivation to maximize rehabilitation and improve mobility. Pt expressed appreciation for supportive space.     OBJECTIVE   MENTAL STATUS     Orientation and consciousness: AOx4   Appearance: Appears stated age  Behavior: Calm, Cooperative, Direct eye contact, and Polite  Speech: Within normal limits  Language: Intact  Mood:  Sad  Affect: Full and Mood congruent  Perceptual disturbance (hallucinations, illusions): None  Thought process and association: Linear and coherent  Thought content (delusions, obsessions etc.): None  Insight: Intact  Judgement: Intact  Memory: WNL, although not formally assessed     EDUCATION/INTERVENTIONS:    -Completed mood check-in and reviewed the patient's current stressors and strategies to manage stress.  -Supportive interventions   -Discussed the patient's treatment goals, revisited discussion of treatment options, and  explored goals of care.

## 2023-04-07 NOTE — Unmapped (Addendum)
Daily Progress Note    Assessment/Plan:    Principal Problem:    Intractable abdominal pain  Active Problems:    [name]    Cancer associated pain    Pain medication agreement signed    Pouchitis (CMS-HCC)    Elevated liver transaminase level    Positive blood culture   Malnutrition Evaluation as performed by RD, LDN: Severe Protein-Calorie Malnutrition in the context of chronic illness (03/27/23 1311)             [name] [name] is a 23 y.o. 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      # Acute on chronic abdominal pain and nausea  Complex situation discussed at length in prior notes. In brief, her pain and nausea are (deeply) multifactorial but consensus among treatment team is that new / unknown organic issue is unlikely. Plan to focus on symptom control, adequate nutrition.  Patient is currently at goal tube feeding, tolerating well.  No visible emesis noted.  Still reports persistent nausea but on aggressive nausea management as below.  -- chronic pain service consulted, appreciated  -- NO CHANGES to pain regimen without multiparty discussion including patient, unless true emergency.  Seen by chronic pain today 1/28, recommended to continue the pain regimen as below no changes made.  -- for pain  -- oxycodone (liquid) 20 mg q4 prn-first-line for pain  -- hydromorphone 1 mg IV q4 prn -second line for pain  (ONLY IF inadequate relief 1 hour after oral alternative)  -- buprenorphine 20 mcg/h patch   -- hyoscyamine prn  -- for nausea, discuss about transitioning one of the IV nausea medication to p.o..  Patient declined and started crying.  -- ondansetron IV q8 scheduled  -- promethazine IV q6 scheduled (not as push)  -- scopolamine patch  -- olanzapine 5 mg in evening  -- no diphenhydramine IV in absence of clear indication  -- for dyspepsia  -- famotidine bid  -- PPI IV q12  -- for motility  -- naloxegol prn  -- okay to use loperamide sparingly      # Left arm PICC line site swelling, pain and ecchymosis: Left arm PICC line site noted mild swelling and mild ecchymosis from insertion.  No visible swelling noted below the elbow.  PVL confirmed acute DVT, started on anticoagulation and removed PICC line on 1/28, given acute DVT also patient is currently not on any IV antibiotic.  .   -Removed PICC line 1/28, site looks clean dry, no concern for infection at insertion site.  -Continue to monitor left upper arm swelling     # Inadequate oral intake  Abnormal gut anatomy notwithstanding, it is the consensus among hospitalist and GI services that oral and/or enteral feeding should be sufficient to meet caloric and hydration needs. Has not developed refeeding syndrome.   -- TPN will not be offered in forseeable future  -- titrate tube feeds to ~100% USRDI = 65 mL/h: reached at 60cc/hr- tolerated well.   --Per dietitian, patient requested for cyclic feeding , started on cyclic feeding with Nutren 2.0, at goal rate 50 cc/h for 16 hours with acute 4-hour water flushes 150 cc .   -- appreciate calorie count per nutrition, is eating a little but not calorically meaningful  -- try adding fiber supplement to help with stool bulk  --  follow BMP, Mg phos q48-72, stop once at goal feeds  -- GI to follow and manage wean from enteral nutrition at discharge     # Elevated transaminases  Improving: Labs with AST 21, ALT 70, downtrending from prior.. No evidence of cholestasis or synthetic dysfunction. Presume drug-related though not sure from what culprit  -- can stop following closely as trend is well established     # Pouchitis (dx 03/2023), resolving  No plan for repeat endoscopy or colonoscopy during this admission, per GI, given recent exam. No significant decrement in Hb so far  -- cefdinir 300 mg bid to complete 4 weeks (end = 04/07/2023) then daily for 4 weeks (end = 05/05/2023)  -- CBC weekly     # Desmoid tumors  -- okay to hold nirogesat while having difficulty with oral meds, no urgency to resume per Dr [name]     # Mood / anxiety  -- continue escitalopram (has been taking), mirtazipine (has been declining)  -- lorazepam 1 mg bid prn (home medication)     Daily checklist:  VTE prophylaxis: low risk, PADUA score < 4; encourage ambulation  Diet: Nutrition Therapy Regular/House  Osmolite 1.5 (Standard 1.5 Cal) continuous tube feed  Supplement Pediatric; Pediasure (PED Standard); # of Products PER Serving: 1 2xd PC  Code status: Full Code  HCDM:   HCDM ([place] policy, no known patient preference): Deak,[name] - Mother - [phone]    HCDM, back-up (If primary HCDM is unavailable): Haber,Jeff - Father - [phone]     Disposition: Floor status. She is interested in [facility] again at discharge; PT/OT notes support, consult placed for candidacy. Primary discharge goal is tube feeding at goal rate. Further medication changes would depend on discharge disposition and what could be accommodated at [facility] if that is her destination.  -AIR evaluating currently, pending final decision     PT/OT recommendations: Skilled PT services indicated, 5x weekly, High intensity (04/02/23) / 5x weekly, High intensity (04/02/23)  DME needs: None (04/02/23) / Defer to post acute (04/02/23)     I personally spent 45 minutes face-to-face and non-face-to-face in the care of this patient, which includes all pre, intra, and post visit time on the date of service.  All documented time was specific to the E/M visit and does not include any procedures that may have been performed.      ___________________________________________________________________    Subjective:  No acute events overnight, Patient states pain well controlled, Denies CP or SOB, Continues to have nausea    No results found for this or any previous visit (from the past 24 hours).  Labs/Studies:  Labs and Studies from the last 24hrs per EMR and Reviewed    Objective:  Temp:  [36.4 ??C (97.5 ??F)-36.7 ??C (98.1 ??F)] 36.5 ??C (97.7 ??F)  Heart Rate:  [73-78] 78  Resp:  [16] 16  BP: (105-107)/(50-55) 105/52  SpO2:  [96 %-98 %] 96 %    General: Ambulating from the bathroom to the bed with walker,  alert, conversant, cooperative, and in no distress.  Eyes: No scleral icterus. Clear conjunctivae. EOMi.  ENT: Normal appearing nose.small bore ND tube left nostril,  Oropharyngeal mucus membranes moist & pink.  Neck: No lymphadenopathy.  Cardiovascular: Regular rate and rhythm. No murmur.  Left arm PICC line site with mild swelling, ecchymosis which remains unchanged from prior with some itchiness and pain, no obvious erythema  Extremities: Warm to touch. Regular 2+ radial pulses bilaterally. No LE edema.  Respiratory: Normal respiratory  effort on room air Clear to auscultation bilaterally.  Gastrointestinal: Soft, non-tender, non-distended with normoactive bowel sounds.  Neurologic: Alert and fully oriented. Normal speech and language. CAM negative. Moving all extremities purposefully. Normal gait.  Skin: Skin is warm, dry and intact. No rash.  Psychiatric:  Mood and affect are normal. Speech and behavior are normal.

## 2023-04-07 NOTE — Unmapped (Signed)
The Venous Access Team (VAT) received a request to pull PICC line. Per PVL, patient has clot around line. Provider to pull line.       PIV Workup / Procedure Time:  15 minutes    care RN was notified.     Thank you,     Salome Hautala Jean Rosenthal, RN Venous Access Team

## 2023-04-07 NOTE — Unmapped (Signed)
[name] [name] remains adherent to medication/treatment regimen. A&Ox4, afebrile with stable vital signs. Some confusion (sleep related) but easily re-orientated. [name] [name] stated she blood in her stool (maroon in color).  Mother remains supportive at bedside.      Problem: Adult Inpatient Plan of Care  Goal: Plan of Care Review  Outcome: Progressing  Goal: Patient-Specific Goal (Individualized)  Outcome: Progressing  Goal: Absence of Hospital-Acquired Illness or Injury  Outcome: Progressing  Intervention: Identify and Manage Fall Risk  Recent Flowsheet Documentation  Taken 04/07/2023 0600 by Ennis Forts, RN  Safety Interventions: family at bedside  Taken 04/07/2023 0400 by Ennis Forts, RN  Safety Interventions: family at bedside  Taken 04/07/2023 0200 by Ennis Forts, RN  Safety Interventions: family at bedside  Taken 04/07/2023 0000 by Ennis Forts, RN  Safety Interventions: family at bedside  Taken 04/06/2023 2200 by Ennis Forts, RN  Safety Interventions:   family at bedside   fall reduction program maintained   low bed   lighting adjusted for tasks/safety  Taken 04/06/2023 2020 by Ennis Forts, RN  Safety Interventions:   family at bedside   fall reduction program maintained   lighting adjusted for tasks/safety   low bed  Intervention: Prevent Infection  Recent Flowsheet Documentation  Taken 04/06/2023 2020 by Ennis Forts, RN  Infection Prevention:   single patient room provided   rest/sleep promoted   hand hygiene promoted  Goal: Optimal Comfort and Wellbeing  Outcome: Progressing  Goal: Readiness for Transition of Care  Outcome: Progressing  Goal: Rounds/Family Conference  Outcome: Progressing     Problem: Infection  Goal: Absence of Infection Signs and Symptoms  Outcome: Progressing  Intervention: Prevent or Manage Infection  Recent Flowsheet Documentation  Taken 04/06/2023 2020 by Ennis Forts, RN  Infection Management: aseptic technique maintained  Isolation Precautions: protective precautions maintained     Problem: Latex Allergy  Goal: Absence of Allergy Symptoms  Outcome: Progressing     Problem: Oncology Care  Goal: Effective Coping  Outcome: Progressing  Goal: Improved Activity Tolerance  Outcome: Progressing  Goal: Optimal Oral Intake  Outcome: Progressing  Goal: Improved Oral Mucous Membrane Integrity  Outcome: Progressing  Goal: Optimal Pain Control and Function  Outcome: Progressing     Problem: Self-Care Deficit  Goal: Improved Ability to Complete Activities of Daily Living  Outcome: Progressing     Problem: Fall Injury Risk  Goal: Absence of Fall and Fall-Related Injury  Outcome: Progressing  Intervention: Promote Injury-Free Environment  Recent Flowsheet Documentation  Taken 04/07/2023 0600 by Ennis Forts, RN  Safety Interventions: family at bedside  Taken 04/07/2023 0400 by Ennis Forts, RN  Safety Interventions: family at bedside  Taken 04/07/2023 0200 by Ennis Forts, RN  Safety Interventions: family at bedside  Taken 04/07/2023 0000 by Ennis Forts, RN  Safety Interventions: family at bedside  Taken 04/06/2023 2200 by Ennis Forts, RN  Safety Interventions:   family at bedside   fall reduction program maintained   low bed   lighting adjusted for tasks/safety  Taken 04/06/2023 2020 by Ennis Forts, RN  Safety Interventions:   family at bedside   fall reduction program maintained   lighting adjusted for tasks/safety   low bed     Problem: Skin Injury Risk Increased  Goal: Skin Health and Integrity  Outcome: Progressing     Problem: Pain Acute  Goal: Optimal Pain Control and Function  Outcome: Progressing

## 2023-04-07 NOTE — Unmapped (Signed)
[facility] Liaison - Referred to Venous Access Team Note      [facility] Liaison was consulted for issues with PICC. Referred to Venous Access Team.     Thank you for this consult,  Elizebeth Brooking, AGNP RN    Consult Time  15 minutes (min)

## 2023-04-07 NOTE — Unmapped (Signed)
Patient reported corpack tape came off and was not sure if it came out. Hospitalist notified. Portable xray completed, new order to ok to use. Tube feeding started this evening. PRN pain medication given for pain management. Hospitalist, Stellamary, removed left upper arm picc line at bedside this shift. PIV present. Patient mom at bedside and assist with patient's care.   Problem: Adult Inpatient Plan of Care  Goal: Plan of Care Review  Outcome: Progressing  Goal: Patient-Specific Goal (Individualized)  Outcome: Progressing  Goal: Absence of Hospital-Acquired Illness or Injury  Outcome: Progressing  Goal: Optimal Comfort and Wellbeing  Outcome: Progressing  Goal: Readiness for Transition of Care  Outcome: Progressing  Goal: Rounds/Family Conference  Outcome: Progressing     Problem: Infection  Goal: Absence of Infection Signs and Symptoms  Outcome: Progressing     Problem: Latex Allergy  Goal: Absence of Allergy Symptoms  Outcome: Progressing     Problem: Oncology Care  Goal: Effective Coping  Outcome: Progressing  Goal: Improved Activity Tolerance  Outcome: Progressing  Goal: Optimal Oral Intake  Outcome: Progressing  Goal: Improved Oral Mucous Membrane Integrity  Outcome: Progressing  Goal: Optimal Pain Control and Function  Outcome: Progressing     Problem: Self-Care Deficit  Goal: Improved Ability to Complete Activities of Daily Living  Outcome: Progressing     Problem: Fall Injury Risk  Goal: Absence of Fall and Fall-Related Injury  Outcome: Progressing     Problem: Skin Injury Risk Increased  Goal: Skin Health and Integrity  Outcome: Progressing     Problem: Pain Acute  Goal: Optimal Pain Control and Function  Outcome: Progressing

## 2023-04-08 LAB — COMPREHENSIVE METABOLIC PANEL
ALBUMIN: 3.2 g/dL — ABNORMAL LOW (ref 3.4–5.0)
ALKALINE PHOSPHATASE: 103 U/L (ref 46–116)
ALT (SGPT): 45 U/L (ref 10–49)
ANION GAP: 10 mmol/L (ref 5–14)
AST (SGOT): 17 U/L (ref <=34)
BILIRUBIN TOTAL: 0.2 mg/dL — ABNORMAL LOW (ref 0.3–1.2)
BLOOD UREA NITROGEN: 12 mg/dL (ref 9–23)
BUN / CREAT RATIO: 24
CALCIUM: 9.4 mg/dL (ref 8.7–10.4)
CHLORIDE: 102 mmol/L (ref 98–107)
CO2: 32 mmol/L — ABNORMAL HIGH (ref 20.0–31.0)
CREATININE: 0.51 mg/dL — ABNORMAL LOW (ref 0.55–1.02)
EGFR CKD-EPI (2021) FEMALE: >90 mL/min/{1.73_m2} (ref >=60)
GLUCOSE RANDOM: 81 mg/dL (ref 70–179)
POTASSIUM: 4.1 mmol/L (ref 3.4–4.8)
PROTEIN TOTAL: 6 g/dL (ref 5.7–8.2)
SODIUM: 144 mmol/L (ref 135–145)

## 2023-04-08 LAB — MAGNESIUM: MAGNESIUM: 2.1 mg/dL (ref 1.6–2.6)

## 2023-04-08 LAB — PHOSPHORUS: PHOSPHORUS: 4.2 mg/dL (ref 2.4–5.1)

## 2023-04-08 MED ADMIN — multivitamin with folic acid 400 mcg tablet 1 tablet: 1 | ORAL | @ 14:00:00

## 2023-04-08 MED ADMIN — lidocaine (ASPERCREME) 4 % 1 patch: 1 | TRANSDERMAL | @ 15:00:00

## 2023-04-08 MED ADMIN — sodium chloride (NS) 0.9 % flush 10 mL: 10 mL | INTRAVENOUS | @ 11:00:00

## 2023-04-08 MED ADMIN — promethazine (PHENERGAN) 12.5 mg in sodium chloride (NS) 0.9 % 50 mL IVPB: 12.5 mg | INTRAVENOUS | @ 05:00:00

## 2023-04-08 MED ADMIN — oxyCODONE (ROXICODONE) 5 mg/5 mL solution 20 mg: 20 mg | ORAL | @ 01:00:00 | Stop: 2023-04-09

## 2023-04-08 MED ADMIN — oxyCODONE (ROXICODONE) 5 mg/5 mL solution 20 mg: 20 mg | ORAL | @ 05:00:00 | Stop: 2023-04-09

## 2023-04-08 MED ADMIN — famotidine (PEPCID) oral suspension: 20 mg | ENTERAL | @ 04:00:00

## 2023-04-08 MED ADMIN — ondansetron (ZOFRAN) injection 8 mg: 8 mg | INTRAVENOUS | @ 11:00:00

## 2023-04-08 MED ADMIN — pantoprazole (Protonix) injection 40 mg: 40 mg | INTRAVENOUS | @ 04:00:00

## 2023-04-08 MED ADMIN — buprenorphine 20 mcg/hour transdermal patch 1 patch: 1 | TRANSDERMAL | @ 14:00:00

## 2023-04-08 MED ADMIN — HYDROmorphone (PF) (DILAUDID) injection 1 mg: 1 mg | INTRAVENOUS | @ 21:00:00 | Stop: 2023-04-11

## 2023-04-08 MED ADMIN — promethazine (PHENERGAN) 12.5 mg in sodium chloride (NS) 0.9 % 50 mL IVPB: 12.5 mg | INTRAVENOUS | @ 15:00:00

## 2023-04-08 MED ADMIN — guar gum (NUTRISOURCE) 1 packet: 1 | ENTERAL | @ 14:00:00

## 2023-04-08 MED ADMIN — HYDROmorphone (PF) (DILAUDID) injection 1 mg: 1 mg | INTRAVENOUS | @ 13:00:00 | Stop: 2023-04-11

## 2023-04-08 MED ADMIN — enoxaparin (LOVENOX) syringe 50 mg: 1 mg/kg | SUBCUTANEOUS | @ 04:00:00

## 2023-04-08 MED ADMIN — sodium chloride (NS) 0.9 % flush 10 mL: 10 mL | INTRAVENOUS | @ 04:00:00

## 2023-04-08 MED ADMIN — HYDROmorphone (PF) (DILAUDID) injection 1 mg: 1 mg | INTRAVENOUS | @ 17:00:00 | Stop: 2023-04-11

## 2023-04-08 MED ADMIN — oxyCODONE (ROXICODONE) 5 mg/5 mL solution 20 mg: 20 mg | ORAL | @ 20:00:00 | Stop: 2023-04-09

## 2023-04-08 MED ADMIN — oxyCODONE (ROXICODONE) 5 mg/5 mL solution 20 mg: 20 mg | ORAL | @ 15:00:00 | Stop: 2023-04-09

## 2023-04-08 MED ADMIN — OLANZapine zydis (ZYPREXA) disintegrating tablet 5 mg: 5 mg | ORAL | @ 04:00:00

## 2023-04-08 MED ADMIN — cefdinir (OMNICEF) oral suspension: 300 mg | ORAL | @ 04:00:00 | Stop: 2023-04-08

## 2023-04-08 MED ADMIN — ammonium lactate (LAC-HYDRIN) 12 % lotion 1 Application: 1 | TOPICAL | @ 15:00:00

## 2023-04-08 MED ADMIN — acetaminophen (TYLENOL) oral liquid: 1000 mg | ENTERAL | @ 20:00:00

## 2023-04-08 MED ADMIN — HYDROmorphone (PF) (DILAUDID) injection 1 mg: 1 mg | INTRAVENOUS | @ 07:00:00 | Stop: 2023-04-11

## 2023-04-08 MED ADMIN — ondansetron (ZOFRAN) injection 8 mg: 8 mg | INTRAVENOUS | @ 04:00:00

## 2023-04-08 MED ADMIN — guar gum (NUTRISOURCE) 1 packet: 1 | ENTERAL | @ 20:00:00

## 2023-04-08 MED ADMIN — HYDROmorphone (PF) (DILAUDID) injection 1 mg: 1 mg | INTRAVENOUS | @ 02:00:00 | Stop: 2023-04-11

## 2023-04-08 MED ADMIN — promethazine (PHENERGAN) 12.5 mg in sodium chloride (NS) 0.9 % 50 mL IVPB: 12.5 mg | INTRAVENOUS | @ 21:00:00

## 2023-04-08 MED ADMIN — acetaminophen (TYLENOL) oral liquid: 1000 mg | ENTERAL | @ 04:00:00

## 2023-04-08 MED ADMIN — cetirizine (ZYRTEC) oral syrup: 20 mg | ENTERAL | @ 15:00:00

## 2023-04-08 MED ADMIN — oxyCODONE (ROXICODONE) 5 mg/5 mL solution 20 mg: 20 mg | ORAL | @ 11:00:00 | Stop: 2023-04-09

## 2023-04-08 MED ADMIN — acetaminophen (TYLENOL) oral liquid: 1000 mg | ENTERAL | @ 11:00:00

## 2023-04-08 MED ADMIN — pantoprazole (Protonix) injection 40 mg: 40 mg | INTRAVENOUS | @ 14:00:00

## 2023-04-08 MED ADMIN — ondansetron (ZOFRAN) injection 8 mg: 8 mg | INTRAVENOUS | @ 20:00:00

## 2023-04-08 MED ADMIN — escitalopram (LEXAPRO) oral solution: 5 mg | ENTERAL | @ 14:00:00

## 2023-04-08 MED ADMIN — enoxaparin (LOVENOX) syringe 50 mg: 1 mg/kg | SUBCUTANEOUS | @ 15:00:00

## 2023-04-08 MED ADMIN — fluticasone propionate (FLONASE) 50 mcg/actuation nasal spray 1 spray: 1 | TOPICAL | @ 14:00:00

## 2023-04-08 MED ADMIN — cefdinir (OMNICEF) oral suspension: 300 mg | ORAL | @ 15:00:00 | Stop: 2023-05-06

## 2023-04-08 NOTE — Unmapped (Signed)
Ms. Enyeart has been medicated with hydromorphone 1mg  x2 this shift and oxycodone 20 mg x 3 for report of 8-9/10 abdominal pain.  Also reports intermittent nausea, but no episodes of vomiting; remains on scheduled ondansetron and promethazine.  Tube feeding maintained at 50 ml/hour throughout shift.  Patient reported pain at right forearm PIV site.  No inflammation noted and able to flush.  Vascular access team requested to evaluate PIV.  Per report for VAT RN, patency of PIV verified with ultrasound, but removed due to reported pain/discomfort and new PIV placed.      Problem: Adult Inpatient Plan of Care  Goal: Plan of Care Review  Outcome: Progressing  Goal: Patient-Specific Goal (Individualized)  Outcome: Progressing  Goal: Absence of Hospital-Acquired Illness or Injury  Outcome: Progressing  Intervention: Prevent Skin Injury  Recent Flowsheet Documentation  Taken 04/07/2023 2130 by Henderson Baltimore, RN  Positioning for Skin: Supine/Back  Goal: Optimal Comfort and Wellbeing  Outcome: Progressing  Goal: Readiness for Transition of Care  Outcome: Progressing  Goal: Rounds/Family Conference  Outcome: Progressing     Problem: Infection  Goal: Absence of Infection Signs and Symptoms  Outcome: Progressing     Problem: Fall Injury Risk  Goal: Absence of Fall and Fall-Related Injury  Outcome: Progressing     Problem: Skin Injury Risk Increased  Goal: Skin Health and Integrity  Outcome: Progressing  Intervention: Optimize Skin Protection  Recent Flowsheet Documentation  Taken 04/07/2023 2030 by Henderson Baltimore, RN  Activity Management: bedrest     Problem: Pain Acute  Goal: Optimal Pain Control and Function  Outcome: Progressing

## 2023-04-08 NOTE — Unmapped (Signed)
VENOUS ACCESS ULTRASOUND PROCEDURE NOTE    Indications:   Poor venous access.    The Venous Access Team has assessed this patient for the placement of a PIV. Ultrasound guidance was necessary to obtain access.     Procedure Details:  Identity of the patient was confirmed via name, medical record number and date of birth. The availability of the correct equipment was verified.    The vein was identified for ultrasound catheter insertion.  Field was prepared with necessary supplies and equipment.  Probe cover and sterile gel utilized.  Insertion site was prepped with chlorhexidine solution and allowed to dry.  The catheter extension was primed with normal saline. A(n) 22 gauge 1.75 catheter was placed in the R Forearm with 1attempt(s). See LDA for additional details.    Catheter aspirated, 2 mL blood return present. The catheter was then flushed with 10 mL of normal saline. Insertion site cleansed, and dressing applied per manufacturer guidelines. The catheter was inserted with difficulty due to poor vasculature by Fredderick Erb, RN.    Primary RN was notified.     Thank you,     Fredderick Erb, RN Venous Access Team   [phone]     Workup / Procedure Time:  30 minutes    See images below:

## 2023-04-08 NOTE — Unmapped (Signed)
[facility])   Consultation - Liaison (CL) Psychiatry  CL Psychology Follow-Up Note      Service Date:  04/08/23  Admit Date:  03/25/23  Clinician:  Arlana Pouch, PsyD  Intervention: 60 min Individual Psychotherapy  Face-to-Face Clinical Contact with Patient: 55 min  Method of Interaction: In-Person    BACKGROUND INFORMATION AND REASON FOR REFERRAL:   Please see H&P and other recent records for full details. 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 The patient was admitted to?[facility] Hospitalist service for management?of the concerns above. [facility] Psychology services were requested for evaluation and psychotherapy to address anxiety.    Upon initial evaluation, the patient endorsed recurrent episodes depressive symptoms, including depressed mood, hopelessness, and low self-worth that began when she was approximately 23yo in the context of health-related and interpersonal stressors that is consistent with major depressive disorder. She also endorsed lifelong anxiety about numerous domains that is consistent with generalized anxiety disorder. Additionally, Pt endorsed history of significant medical trauma and associated recurrent nightmares, flashbacks, dissociative episodes, hypervigilance,and avoidance of trauma-related reminders that is concerning for PTSD. Pt is amenable to working with Psychology on techniques to minimize anxiety that may be interfering with care progression during admission.     The patient was seen today for follow-up psychotherapy session to address anxiety.    Interval events:  -No acute/adverse psychological events since last seen by Psychology    ASSESSMENT  IMPRESSIONS/SUMMARY    At follow-up, Pt's mood appeared overall stable. Session topics included emotion-focused coping strategies. Pt actively engaged and receptive to feedback.     DIAGNOSTIC IMPRESSIONS    Major depressive disorder, recurrent episode, moderate  Generalized anxiety disorder  Posttraumatic stress disorder    Risk Assessment:  ASQ screening result: not completed    -A full risk assessment was previously performed on 03/31/23.  Risk assessment remains essentially unchanged.    Current suicide risk: low risk  Current homicide risk: low risk      PLAN  RECOMMENDATIONS    ## Safety and Observation Level:   -This patient is not currently under IVC. If safety concerns arise, please page psychiatry for an evaluation. Recommend routine level of observation per primary team.    ## Follow-up:  - The patient desires ongoing follow-up from CL Psychology while medically inpatient.  - The patient is being followed by Psychiatry.    ## Disposition:  -There are no psychological contraindications to discharging this patient when medically appropriate.   - When this patient is discharged, please ensure that their AVS includes information about the 23 Suicide & Crisis Lifeline.      SUBJECTIVE  SESSION CONTENT     Session began with a brief check-in. Pt denied significant changes in mood. She was provided space to process emotional reactions to health-related stressors. Discussed emotion-focused coping strategies. Pt demonstrated good understanding.     OBJECTIVE   MENTAL STATUS     Orientation and consciousness: AOx4   Appearance: Appears stated age  Behavior: Calm, Cooperative, Direct eye contact, and Polite  Speech: Within normal limits  Language: Intact  Mood:  Neutral  Affect: Full and appropriate to discussion content  Perceptual disturbance (hallucinations, illusions): None  Thought process and association: Linear and coherent  Thought content (delusions, obsessions etc.): None  Insight: Intact  Judgement: Intact  Memory: WNL, although not formally assessed     EDUCATION/INTERVENTIONS:    -Completed mood check-in and reviewed the patient's current stressors and strategies to manage stress.  -Cognitive behavioral therapy  -Discussed the patient's treatment goals, revisited discussion of treatment options, and  explored goals of care.

## 2023-04-08 NOTE — Unmapped (Addendum)
Daily Progress Note    Assessment/Plan:    Principal Problem:    Intractable abdominal pain  Active Problems:    [name]    Cancer associated pain    Pain medication agreement signed    Pouchitis (CMS-HCC)    Elevated liver transaminase level    Positive blood culture   Malnutrition Evaluation as performed by RD, LDN: Severe Protein-Calorie Malnutrition in the context of chronic illness (03/27/23 1311)             [name] [name] is a 23 y.o. woman with [name] [name] syndrome s/p proctocolectomy and J-pouch, desmoid tumours, chronic abdominal pain and nausea, anxiety, PTSD related to past medical care who is admitted for constellation of worsening pain, intolerance of oral intake.      # Acute on chronic abdominal pain and nausea  Complex situation discussed at length in prior notes. In brief, her pain and nausea are (deeply) multifactorial but consensus among treatment team is that new / unknown organic issue is unlikely. Plan to focus on symptom control, adequate nutrition.  Patient is currently at goal tube feeding, tolerating well.  No visible emesis noted.  Still reports persistent nausea but on aggressive nausea management as below.  -- chronic pain service consulted, appreciated  -- NO CHANGES to pain regimen without multiparty discussion including patient, unless true emergency.  Seen by chronic pain today 1/28, recommended to continue the pain regimen as below no changes made.  -- for pain  -- oxycodone (liquid) 20 mg q4 prn-first-line for pain  -- hydromorphone 1 mg IV q4 prn -second line for pain  (ONLY IF inadequate relief 1 hour after oral alternative)  -- buprenorphine 20 mcg/h patch   -- hyoscyamine prn  -- for nausea, discuss about transitioning one of the IV nausea medication to p.o..  Patient declined and started crying.  -- ondansetron IV q8 scheduled  -- promethazine IV q6 scheduled (not as push)  -- scopolamine patch  -- olanzapine 5 mg in evening  -- no diphenhydramine IV in absence of clear indication  -- for dyspepsia  -- famotidine bid  -- PPI IV q12  -- for motility  -- naloxegol prn  -- okay to use loperamide sparingly      # Left arm PICC line site swelling, pain and ecchymosis: Left arm PICC line site noted mild swelling and mild ecchymosis from insertion.  No visible swelling noted below the elbow.  PVL confirmed acute DVT, started on anticoagulation and removed PICC line on 1/28, given acute DVT also patient is currently not on any IV antibiotic.    -Removed PICC line 1/28, site looks clean dry, no concern for infection, transparent dressing in place.   -mild left upper arm swelling present- unchanged     # Inadequate oral intake  Abnormal gut anatomy notwithstanding, it is the consensus among hospitalist and GI services that oral and/or enteral feeding should be sufficient to meet caloric and hydration needs. Has not developed refeeding syndrome.   -- TPN will not be offered in forseeable future  -- titrate tube feeds to ~100% USRDI = 65 mL/h: reached at 60cc/hr- tolerated well.   --Per dietitian, patient requested for cyclic feeding , started on cyclic feeding with Nutren 2.0, at goal rate 50 cc/h for 16 hours with acute 4-hour water flushes 150 cc .   -- appreciate calorie count per nutrition, is eating a little but not calorically meaningful  -- try adding fiber supplement to help with stool bulk  --  follow BMP, Mg phos q48-72, stop once at goal feeds  -- GI to follow and manage wean from enteral nutrition at discharge     # Elevated transaminases  Improving: Labs with AST 21, ALT 70, downtrending from prior.. No evidence of cholestasis or synthetic dysfunction. Presume drug-related though not sure from what culprit  -- can stop following closely as trend is well established     # Pouchitis (dx 03/2023), resolving  No plan for repeat endoscopy or colonoscopy during this admission, per GI, given recent exam. No significant decrement in Hb so far  -- cefdinir 300 mg bid to complete 4 weeks (end = 04/07/2023) then daily for 4 weeks (end = 05/05/2023)  -- CBC weekly     # Desmoid tumors  -- okay to hold nirogesat while having difficulty with oral meds, no urgency to resume per Dr [name]     # Mood / anxiety  -- continue escitalopram (has been taking),  - Discontinued mirtazipine (as pt has been declining)-1/29  -- lorazepam 1 mg bid prn (home medication)     Daily checklist:  VTE prophylaxis: low risk, PADUA score < 4; encourage ambulation  Diet: Nutrition Therapy Regular/House  Osmolite 1.5 (Standard 1.5 Cal) continuous tube feed  Supplement Pediatric; Pediasure (PED Standard); # of Products PER Serving: 1 2xd PC  Code status: Full Code  HCDM:   HCDM ([place] policy, no known patient preference): [name],[name] - Mother - [phone]    HCDM, back-up (If primary HCDM is unavailable): [name],[name] - Father - [phone]     Disposition: Floor status. She is interested in [facility] again at discharge; PT/OT notes support, consult placed for candidacy. Primary discharge goal is tube feeding at goal rate. Further medication changes would depend on discharge disposition and what could be accommodated at [facility] if that is her destination.  -[facility] evaluating currently, pending final decision     PT/OT recommendations: Skilled PT services indicated, 5x weekly, High intensity (04/02/23) / 5x weekly, High intensity (04/02/23)  DME needs: None (04/02/23) / Defer to post acute (04/02/23)     I personally spent 45 minutes face-to-face and non-face-to-face in the care of this patient, which includes all pre, intra, and post visit time on the date of service.  All documented time was specific to the E/M visit and does not include any procedures that may have been performed.      ___________________________________________________________________    Subjective:  No acute events overnight, Patient states pain well controlled, Denies CP or SOB, taking some p.o. intake but not significant yet.  Patient reports persistent nausea but no witnessed vomiting.  Tolerating tube feeding well.     Recent Results (from the past 24 hours)   Comprehensive Metabolic Panel    Collection Time: 04/08/23  7:37 AM   Result Value Ref Range    Sodium 144 135 - 145 mmol/L    Potassium 4.1 3.4 - 4.8 mmol/L    Chloride 102 98 - 107 mmol/L    CO2 32.0 (H) 20.0 - 31.0 mmol/L    Anion Gap 10 5 - 14 mmol/L    BUN 12 9 - 23 mg/dL    Creatinine 5.63 (L) 0.55 - 1.02 mg/dL    BUN/Creatinine Ratio 24     eGFR CKD-EPI (2021) Female >90 >=60 mL/min/1.3m2    Glucose 81 70 - 179 mg/dL    Calcium 9.4 8.7 - 87.5 mg/dL    Albumin 3.2 (L) 3.4 - 5.0 g/dL    Total Protein  6.0 5.7 - 8.2 g/dL    Total Bilirubin 0.2 (L) 0.3 - 1.2 mg/dL    AST 17 <=86 U/L    ALT 45 10 - 49 U/L    Alkaline Phosphatase 103 46 - 116 U/L   Magnesium Level    Collection Time: 04/08/23  7:37 AM   Result Value Ref Range    Magnesium 2.1 1.6 - 2.6 mg/dL   Phosphorus Level    Collection Time: 04/08/23  7:37 AM   Result Value Ref Range    Phosphorus 4.2 2.4 - 5.1 mg/dL     Labs/Studies:  Labs and Studies from the last 24hrs per EMR and Reviewed    Objective:      General: Ambulating with walker in the room,  alert, conversant, cooperative, and in no distress.  Eyes: No scleral icterus. Clear conjunctivae. EOMi.  ENT: Normal appearing nose.small bore ND tube left nostril,  Oropharyngeal mucus membranes moist & pink.  Neck: No lymphadenopathy.  Cardiovascular: Regular rate and rhythm. No murmur.  Left arm PICC line site with mild swelling, ecchymosis which remains unchanged from prior with some itchiness and pain, no obvious erythema  Extremities: Warm to touch. Regular 2+ radial pulses bilaterally. No LE edema.  Respiratory: Normal respiratory effort on room air Clear to auscultation bilaterally.  Gastrointestinal: Soft, non-tender, non-distended with normoactive bowel sounds.  Neurologic: Alert and fully oriented. Normal speech and language. CAM negative. Moving all extremities purposefully. Normal gait.  Skin: Skin is warm, dry and intact. No rash.  Psychiatric:  Mood and affect are normal. Speech and behavior are normal.

## 2023-04-08 NOTE — Unmapped (Signed)
[facility]  Follow-Up Psychiatry Consult Note      Date of admission: 03/25/2023  6:00 PM  Service Date: April 08, 2023  Primary Team: [facility])  LOS:  LOS: 12 days      Assessment:   [name] [name] is a 23 y.o. 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 Marsa Aris for ongoing evaluation and management of anxiety and depression.      [name] presents with symptoms consistent with a diagnosis of GAD and MDD, consistent with her historical diagnoses. Symptoms  worsened in the context of prolonged medical hospitalization, with increased anxiety/panic attacks.  Patient notes mood is stable on Lexapro and notes ativan helpful for intermittent anxiety. She also reports has not been taking mirtazapine because reports it caused insomnia. As a result, recommend discontinuing mirtazapine. Pt is not interested in medication for sleep at this time, but if she changes her mind can consider low dose trazodone. Patient is working with CL psychology for psychosocial support. Given pt is psychiatrically stable , will sign off at this time. Pt will continue to work with CL psychology for psychosocial support.     Diagnoses:   Active Hospital problems:  Principal Problem:    Intractable abdominal pain  Active Problems:    [name]    Cancer associated pain    Pain medication agreement signed    Pouchitis (CMS-HCC)    Elevated liver transaminase level    Positive blood culture       Problems edited/added by me:  No problems updated.    Risk Assessment:  ASQ screening result: not completed    -A suicide and violence risk assessment was performed as part of this evaluation. Risk factors for self-harm/suicide: sense of isolation, current diagnosis of depression, and chronic severe medical condition.  Protective factors against self-harm/suicide:  lack of active SI, restricted access to highly lethal means of suicide, no history of previous suicide attempts , motivation for treatment, currently receiving mental health treatment, and supportive family.  Risk factors for harm to others: N/A. Protective factors against harm to others: no known history of violence towards others, no active symptoms of psychosis, high intellectual functioning, and connectedness to family.     Current suicide risk: low risk  Current homicide risk: low risk      Recommendations:     Safety and Observation Level:   -- This patient is not currently under IVC. If safety concerns arise, please page psychiatry for an evaluation.    Medications:  -- STOP home mirtazapine 7.5mg  nightly  given pt continues to refuse and reports not helpful  -- Pt notes not currently interested in medication for insomnia,  but if pt become more amenable , can   consider starting low dose trazodone 12.5 mg nightly and can increase to 25 mg nightly if tolerates well   -- Continue home escitalopram 5mg  daily (currently at 5mg  daily, may increase as tolerated)  -- Continue lorazepam 1mg  BID PRN 1st line panic attacks/severe anxiety    Further Work-up:   -- No further recommendations at this time from a psychiatric standpoint    Behavioral / Environmental:   -- Pt currently followed  by [facility] Psychology  -- Utilize compassion and acknowledge the patient's experiences while setting clear and realistic expectations for care.       Follow-up:  -- When patient is discharged, please ensure that their AVS includes information about the 29 Suicide & Crisis Lifeline.  -- The patient currently receives mental health care with [facility] and we will attempt to coordinate followup with our psychiatry team on discharge  -- We will sign off at this time. Thank you for this consult request. Recommendations have been communicated to the primary team. Please page [phone]  for any questions or concerns.     Discussed with and seen by the attending, Dr. Gerrit Friends, who agrees with the assessment and plan.    Maryjo Rochester, MD PGY-3        Subjective   Relevant events since last seen by psychiatry:   Refusing mirtazapine 7.5 mg nightly  Being seen by [facility] psychology  PRN lorazepam use : 1/26 1 mg, and 1/28 1mg   Patient Interview:  Mood not the best  as has a migraine. Had a bad day yesterday because she did not get much sleep and had an episode of bowel incontinence with tube feeds. States hates that as not had bowel accidents  for a while and had  other medical complications yesterday , including NGT getting dislodged.  Reports anxiety was worse yesterday and notes talking to [facility] psychologist was helpful. States took ativan yesterday due to high anxiety , which was effective.  Has been difficulty sleeping with pain and nausea. States talked to NP outpatient about her mirtazapine as states it would keep her up at night and has not been helpful and does not want to take it.  Talked about other meds like trazodone for sleep but not interested as states pain is what keeps her awake. Only takes ativan when needs. Denies SI/AVH.  ROS:   Negative except per above      Collateral:   - Reviewed medical records in Epic    Relevant Updates to past psychiatric, medical/surgical, family, or social history: None    Current Medications:  Scheduled Meds:   acetaminophen  1,000 mg Enteral tube: post-pyloric (duodenum, jejunum) Q8H SCH    ammonium lactate  1 Application Topical BID    buprenorphine  1 patch Transdermal Weekly    cefdinir  300 mg Oral Daily    cetirizine  20 mg Enteral tube: post-pyloric (duodenum, jejunum) BID    [Provider Hold] cholecalciferol (vitamin D3 25 mcg (1,000 units))  25 mcg Oral Daily    enoxaparin (LOVENOX) injection  1 mg/kg Subcutaneous Q12H SCH escitalopram oxalate  5 mg Enteral tube: post-pyloric (duodenum, jejunum) Daily    famotidine  20 mg Enteral tube: post-pyloric (duodenum, jejunum) Nightly    fluticasone propionate  1 spray Topical Weekly    guar gum  1 packet Enteral tube: post-pyloric (duodenum, jejunum) TID    lidocaine  1 patch Transdermal Daily    mirtazapine  7.5 mg Oral Nightly    multivitamins (ADULT)  1 tablet Oral Daily    OLANZapine zydis  5 mg Oral Q PM    ondansetron  8 mg Intravenous Q8H    pantoprazole (Protonix) intravenous solution  40 mg Intravenous BID    promethazine  12.5 mg Intravenous Q6H    scopolamine  1 patch Topical Q72H    sodium chloride  10 mL Intravenous Q8H    [Provider Hold] thiamine mononitrate (vit B1)  200 mg Oral Daily  Continuous Infusions:   IP okay to treat      IP okay to treat      IP okay to treat       PRN Meds:.hydrocortisone, HYDROmorphone, hyoscyamine, IP okay to treat, IP okay to treat, IP okay to treat, loperamide, LORazepam, melatonin, naloxegol, oxyCODONE, zinc oxide-cod liver oil    Objective:   Vital signs:   Temp:  [36.5 ??C (97.7 ??F)] 36.5 ??C (97.7 ??F)  Heart Rate:  [78] 78  Resp:  [16] 16  BP: (105)/(52) 105/52  MAP (mmHg):  [67] 67  SpO2:  [96 %] 96 %    Physical Exam:  Gen: No acute distress.    Mental Status Exam:  Appearance:  appears stated age and in bed -- NG tube in place, IV lines present.    Attitude:   calm, cooperative   Behavior/Psychomotor:  appropriate eye contact and no abnormal movements   Speech/Language:   normal rate, not pressured, normal volume, normal fluency. normal articulation   Mood: Not good   Affect:  mood congruent and tired   Thought process:  logical, linear, clear, coherent, goal directed   Thought content:   Reports no SI, revolves around navigating interpersonal and developmental challenges under medical stress. Appropriate.    Perceptual disturbances:  Patient does not appear to be responding to internal stimuli     Attention:  able to attend to interview without fluctuations in consciousness   Concentration:  Able to fully concentrate and attend   Orientation:  grossly oriented.   Memory:  not formally tested, but grossly intact   Fund of knowledge:   not formally assessed   Insight:    Intact   Judgment:   Intact   Impulse Control:  Intact     Additional Psychometric Testing:  Not applicable.    Consult Type and Time-Based Documentation:  This patient was evaluated in person.    Time-based billing disclaimer:  I personally spent 60   minutes face-to-face and non-face-to-face in the care of this patient, which includes all pre, intra, and post visit time on the date of service.  All documented time was specific to the E/M visit and does not include any procedures that may have been performed.

## 2023-04-08 NOTE — Unmapped (Addendum)
Pt remains alert and oriented x 4. PRN pain meds both oxy and dilaudid administered around the clock per pt's request. Tolerated IV phenergan at a slower rate over 25 min. Tube feeding stopped at 9 am and restarted at 1630. Water flushes reduced to 75 ml Q3h per provider's order. Voids. Had BM today. Pt did not eat much. Pt complained of itch on the old PICC line site. Site looks red. Pt refuses oral benadryl stating it has burned my GI tract before and it doesn't help with itch. She prefers IV benadryl. Provider couldn't order IV benadryl. Pt refused dressing change to alleviate the itch for now.      Problem: Adult Inpatient Plan of Care  Goal: Plan of Care Review  Outcome: Progressing  Flowsheets (Taken 04/08/2023 1555)  Progress: improving  Plan of Care Reviewed With:   patient   parent  Goal: Patient-Specific Goal (Individualized)  Outcome: Progressing  Goal: Absence of Hospital-Acquired Illness or Injury  Outcome: Progressing  Intervention: Identify and Manage Fall Risk  Recent Flowsheet Documentation  Taken 04/08/2023 1400 by Angelena Form, RN  Safety Interventions:   environmental modification   fall reduction program maintained   low bed   family at bedside  Taken 04/08/2023 1200 by Angelena Form, RN  Safety Interventions:   environmental modification   fall reduction program maintained   family at bedside   low bed  Taken 04/08/2023 1000 by Angelena Form, RN  Safety Interventions:   environmental modification   fall reduction program maintained   family at bedside   low bed  Taken 04/08/2023 0800 by Angelena Form, RN  Safety Interventions:   environmental modification   fall reduction program maintained   family at bedside   low bed  Taken 04/08/2023 0730 by Angelena Form, RN  Safety Interventions:   environmental modification   fall reduction program maintained   family at bedside   low bed  Intervention: Prevent and Manage VTE (Venous Thromboembolism) Risk  Recent Flowsheet Documentation  Taken 04/08/2023 0730 by Angelena Form, RN  Anti-Embolism Device Status: (lovenox) Other (Comment)  Goal: Optimal Comfort and Wellbeing  Outcome: Progressing  Goal: Readiness for Transition of Care  Outcome: Progressing  Goal: Rounds/Family Conference  Outcome: Progressing

## 2023-04-09 MED ADMIN — oxyCODONE (ROXICODONE) 5 mg/5 mL solution 20 mg: 20 mg | ORAL | @ 06:00:00 | Stop: 2023-04-09

## 2023-04-09 MED ADMIN — cetirizine (ZYRTEC) oral syrup: 20 mg | ENTERAL | @ 14:00:00

## 2023-04-09 MED ADMIN — pantoprazole (Protonix) injection 40 mg: 40 mg | INTRAVENOUS | @ 14:00:00

## 2023-04-09 MED ADMIN — oxyCODONE (ROXICODONE) 5 mg/5 mL solution 20 mg: 20 mg | ORAL | @ 10:00:00 | Stop: 2023-04-09

## 2023-04-09 MED ADMIN — sodium chloride (NS) 0.9 % flush 10 mL: 10 mL | INTRAVENOUS | @ 02:00:00

## 2023-04-09 MED ADMIN — ammonium lactate (LAC-HYDRIN) 12 % lotion 1 Application: 1 | TOPICAL | @ 14:00:00

## 2023-04-09 MED ADMIN — hydrocortisone 1 % cream: TOPICAL | @ 06:00:00

## 2023-04-09 MED ADMIN — oxyCODONE (ROXICODONE) 5 mg/5 mL solution 20 mg: 20 mg | ORAL | @ 16:00:00 | Stop: 2023-04-09

## 2023-04-09 MED ADMIN — OLANZapine zydis (ZYPREXA) disintegrating tablet 5 mg: 5 mg | ORAL | @ 23:00:00

## 2023-04-09 MED ADMIN — cefdinir (OMNICEF) oral suspension: 300 mg | ORAL | @ 14:00:00 | Stop: 2023-05-06

## 2023-04-09 MED ADMIN — ondansetron (ZOFRAN) injection 8 mg: 8 mg | INTRAVENOUS | @ 19:00:00

## 2023-04-09 MED ADMIN — HYDROmorphone (PF) (DILAUDID) injection 1 mg: 1 mg | INTRAVENOUS | @ 08:00:00 | Stop: 2023-04-11

## 2023-04-09 MED ADMIN — promethazine (PHENERGAN) 12.5 mg in sodium chloride (NS) 0.9 % 50 mL IVPB: 12.5 mg | INTRAVENOUS | @ 16:00:00

## 2023-04-09 MED ADMIN — enoxaparin (LOVENOX) syringe 50 mg: 1 mg/kg | SUBCUTANEOUS | @ 04:00:00

## 2023-04-09 MED ADMIN — promethazine (PHENERGAN) 12.5 mg in sodium chloride (NS) 0.9 % 50 mL IVPB: 12.5 mg | INTRAVENOUS | @ 21:00:00

## 2023-04-09 MED ADMIN — sodium chloride (NS) 0.9 % flush 10 mL: 10 mL | INTRAVENOUS | @ 10:00:00

## 2023-04-09 MED ADMIN — acetaminophen (TYLENOL) oral liquid: 1000 mg | ENTERAL | @ 19:00:00

## 2023-04-09 MED ADMIN — oxyCODONE (ROXICODONE) 5 mg/5 mL solution 20 mg: 20 mg | ORAL | @ 02:00:00 | Stop: 2023-04-09

## 2023-04-09 MED ADMIN — pantoprazole (Protonix) injection 40 mg: 40 mg | INTRAVENOUS | @ 02:00:00

## 2023-04-09 MED ADMIN — guar gum (NUTRISOURCE) 1 packet: 1 | ENTERAL | @ 14:00:00

## 2023-04-09 MED ADMIN — ammonium lactate (LAC-HYDRIN) 12 % lotion 1 Application: 1 | TOPICAL | @ 02:00:00

## 2023-04-09 MED ADMIN — diphenhydrAMINE (BENADRYL) injection: 50 mg | INTRAVENOUS | @ 01:00:00 | Stop: 2023-04-08

## 2023-04-09 MED ADMIN — HYDROmorphone (PF) (DILAUDID) injection 1 mg: 1 mg | INTRAVENOUS | @ 19:00:00 | Stop: 2023-04-11

## 2023-04-09 MED ADMIN — ondansetron (ZOFRAN) injection 8 mg: 8 mg | INTRAVENOUS | @ 02:00:00

## 2023-04-09 MED ADMIN — guar gum (NUTRISOURCE) 1 packet: 1 | ENTERAL | @ 19:00:00

## 2023-04-09 MED ADMIN — lidocaine (ASPERCREME) 4 % 1 patch: 1 | TRANSDERMAL | @ 14:00:00

## 2023-04-09 MED ADMIN — acetaminophen (TYLENOL) oral liquid: 1000 mg | ENTERAL | @ 02:00:00

## 2023-04-09 MED ADMIN — ondansetron (ZOFRAN) injection 8 mg: 8 mg | INTRAVENOUS | @ 12:00:00

## 2023-04-09 MED ADMIN — promethazine (PHENERGAN) 12.5 mg in sodium chloride (NS) 0.9 % 50 mL IVPB: 12.5 mg | INTRAVENOUS | @ 08:00:00

## 2023-04-09 MED ADMIN — escitalopram (LEXAPRO) oral solution: 5 mg | ENTERAL | @ 14:00:00

## 2023-04-09 MED ADMIN — enoxaparin (LOVENOX) syringe 50 mg: 1 mg/kg | SUBCUTANEOUS | @ 17:00:00

## 2023-04-09 MED ADMIN — guar gum (NUTRISOURCE) 1 packet: 1 | ENTERAL | @ 02:00:00

## 2023-04-09 MED ADMIN — promethazine (PHENERGAN) 12.5 mg in sodium chloride (NS) 0.9 % 50 mL IVPB: 12.5 mg | INTRAVENOUS | @ 04:00:00

## 2023-04-09 MED ADMIN — HYDROmorphone (PF) (DILAUDID) injection 1 mg: 1 mg | INTRAVENOUS | @ 03:00:00 | Stop: 2023-04-11

## 2023-04-09 MED ADMIN — famotidine (PEPCID) oral suspension: 20 mg | ENTERAL | @ 02:00:00

## 2023-04-09 MED ADMIN — OLANZapine zydis (ZYPREXA) disintegrating tablet 5 mg: 5 mg | ORAL | @ 02:00:00

## 2023-04-09 MED ADMIN — HYDROmorphone (PF) (DILAUDID) injection 1 mg: 1 mg | INTRAVENOUS | @ 14:00:00 | Stop: 2023-04-11

## 2023-04-09 MED ADMIN — acetaminophen (TYLENOL) oral liquid: 1000 mg | ENTERAL | @ 10:00:00

## 2023-04-09 MED ADMIN — cetirizine (ZYRTEC) oral syrup: 20 mg | ENTERAL | @ 02:00:00

## 2023-04-09 NOTE — Unmapped (Signed)
Problem: Adult Inpatient Plan of Care  Goal: Plan of Care Review  Outcome: Progressing  Goal: Patient-Specific Goal (Individualized)  Outcome: Progressing  Goal: Absence of Hospital-Acquired Illness or Injury  Outcome: Progressing  Intervention: Identify and Manage Fall Risk  Recent Flowsheet Documentation  Taken 04/08/2023 2000 by Sheliah Mends, RN  Safety Interventions:   environmental modification   fall reduction program maintained   family at bedside   lighting adjusted for tasks/safety   low bed   nonskid shoes/slippers when out of bed  Goal: Optimal Comfort and Wellbeing  Outcome: Progressing  Goal: Readiness for Transition of Care  Outcome: Progressing  Goal: Rounds/Family Conference  Outcome: Progressing     Problem: Infection  Goal: Absence of Infection Signs and Symptoms  Outcome: Progressing     Problem: Latex Allergy  Goal: Absence of Allergy Symptoms  Outcome: Progressing     Problem: Oncology Care  Goal: Effective Coping  Outcome: Progressing  Goal: Improved Activity Tolerance  Outcome: Progressing  Goal: Optimal Oral Intake  Outcome: Progressing  Goal: Improved Oral Mucous Membrane Integrity  Outcome: Progressing  Goal: Optimal Pain Control and Function  Outcome: Progressing     Problem: Self-Care Deficit  Goal: Improved Ability to Complete Activities of Daily Living  Outcome: Progressing     Problem: Fall Injury Risk  Goal: Absence of Fall and Fall-Related Injury  Outcome: Progressing  Intervention: Promote Injury-Free Environment  Recent Flowsheet Documentation  Taken 04/08/2023 2000 by Sheliah Mends, RN  Safety Interventions:   environmental modification   fall reduction program maintained   family at bedside   lighting adjusted for tasks/safety   low bed   nonskid shoes/slippers when out of bed     Problem: Skin Injury Risk Increased  Goal: Skin Health and Integrity  Outcome: Progressing     Problem: Pain Acute  Goal: Optimal Pain Control and Function  Outcome: Progressing

## 2023-04-09 NOTE — Unmapped (Signed)
Pt alert oriented, pt keep drinking Pediasure by mouth, besides that she is not taking anything by mouth, c/o nausea and pain prescribed meds works well. No any acute changes on her care plan. VSS will continue the care plan.   Problem: Adult Inpatient Plan of Care  Goal: Plan of Care Review  Outcome: Ongoing - Unchanged  Goal: Patient-Specific Goal (Individualized)  Outcome: Ongoing - Unchanged  Goal: Absence of Hospital-Acquired Illness or Injury  Outcome: Ongoing - Unchanged  Intervention: Identify and Manage Fall Risk  Recent Flowsheet Documentation  Taken 04/09/2023 1400 by Blanche East, RN  Safety Interventions:   low bed   fall reduction program maintained  Taken 04/09/2023 1200 by Blanche East, RN  Safety Interventions:   low bed   lighting adjusted for tasks/safety   fall reduction program maintained  Taken 04/09/2023 1000 by Blanche East, RN  Safety Interventions:   low bed   lighting adjusted for tasks/safety   fall reduction program maintained  Taken 04/09/2023 0800 by Blanche East, RN  Safety Interventions:   low bed   fall reduction program maintained  Intervention: Prevent Skin Injury  Recent Flowsheet Documentation  Taken 04/09/2023 0830 by Blanche East, RN  Positioning for Skin: Supine/Back  Device Skin Pressure Protection: adhesive use limited  Skin Protection: adhesive use limited  Goal: Optimal Comfort and Wellbeing  Outcome: Ongoing - Unchanged  Goal: Readiness for Transition of Care  Outcome: Ongoing - Unchanged  Goal: Rounds/Family Conference  Outcome: Ongoing - Unchanged     Problem: Infection  Goal: Absence of Infection Signs and Symptoms  Outcome: Ongoing - Unchanged     Problem: Latex Allergy  Goal: Absence of Allergy Symptoms  Outcome: Ongoing - Unchanged     Problem: Oncology Care  Goal: Effective Coping  Outcome: Ongoing - Unchanged  Goal: Improved Activity Tolerance  Outcome: Ongoing - Unchanged  Goal: Optimal Oral Intake  Outcome: Ongoing - Unchanged  Goal: Improved Oral Mucous Membrane Integrity  Outcome: Ongoing - Unchanged  Goal: Optimal Pain Control and Function  Outcome: Ongoing - Unchanged     Problem: Self-Care Deficit  Goal: Improved Ability to Complete Activities of Daily Living  Outcome: Ongoing - Unchanged     Problem: Fall Injury Risk  Goal: Absence of Fall and Fall-Related Injury  Outcome: Ongoing - Unchanged  Intervention: Promote Injury-Free Environment  Recent Flowsheet Documentation  Taken 04/09/2023 1400 by Blanche East, RN  Safety Interventions:   low bed   fall reduction program maintained  Taken 04/09/2023 1200 by Blanche East, RN  Safety Interventions:   low bed   lighting adjusted for tasks/safety   fall reduction program maintained  Taken 04/09/2023 1000 by Blanche East, RN  Safety Interventions:   low bed   lighting adjusted for tasks/safety   fall reduction program maintained  Taken 04/09/2023 0800 by Blanche East, RN  Safety Interventions:   low bed   fall reduction program maintained     Problem: Skin Injury Risk Increased  Goal: Skin Health and Integrity  Outcome: Ongoing - Unchanged  Intervention: Optimize Skin Protection  Recent Flowsheet Documentation  Taken 04/09/2023 0830 by Blanche East, RN  Pressure Reduction Techniques: frequent weight shift encouraged  Pressure Reduction Devices: specialty bed utilized  Skin Protection: adhesive use limited     Problem: Pain Acute  Goal: Optimal Pain Control and Function  Outcome: Ongoing - Unchanged

## 2023-04-10 LAB — COMPREHENSIVE METABOLIC PANEL
ALBUMIN: 3.7 g/dL (ref 3.4–5.0)
ALKALINE PHOSPHATASE: 120 U/L — ABNORMAL HIGH (ref 46–116)
ALT (SGPT): 34 U/L (ref 10–49)
ANION GAP: 11 mmol/L (ref 5–14)
AST (SGOT): 20 U/L (ref <=34)
BILIRUBIN TOTAL: 0.2 mg/dL — ABNORMAL LOW (ref 0.3–1.2)
BLOOD UREA NITROGEN: 14 mg/dL (ref 9–23)
BUN / CREAT RATIO: 27
CALCIUM: 9.8 mg/dL (ref 8.7–10.4)
CHLORIDE: 99 mmol/L (ref 98–107)
CO2: 32 mmol/L — ABNORMAL HIGH (ref 20.0–31.0)
CREATININE: 0.51 mg/dL — ABNORMAL LOW (ref 0.55–1.02)
EGFR CKD-EPI (2021) FEMALE: >90 mL/min/{1.73_m2} (ref >=60)
GLUCOSE RANDOM: 96 mg/dL (ref 70–179)
POTASSIUM: 3.9 mmol/L (ref 3.4–4.8)
PROTEIN TOTAL: 7 g/dL (ref 5.7–8.2)
SODIUM: 142 mmol/L (ref 135–145)

## 2023-04-10 LAB — MAGNESIUM: MAGNESIUM: 2.2 mg/dL (ref 1.6–2.6)

## 2023-04-10 LAB — PHOSPHORUS: PHOSPHORUS: 4.9 mg/dL (ref 2.4–5.1)

## 2023-04-10 MED ADMIN — ondansetron (ZOFRAN) injection 8 mg: 8 mg | INTRAVENOUS | @ 11:00:00

## 2023-04-10 MED ADMIN — promethazine (PHENERGAN) 12.5 mg in sodium chloride (NS) 0.9 % 50 mL IVPB: 12.5 mg | INTRAVENOUS | @ 15:00:00

## 2023-04-10 MED ADMIN — acetaminophen (TYLENOL) oral liquid: 1000 mg | ENTERAL | @ 19:00:00

## 2023-04-10 MED ADMIN — oxyCODONE (ROXICODONE) 5 mg/5 mL solution 20 mg: 20 mg | ORAL | @ 11:00:00 | Stop: 2023-04-20

## 2023-04-10 MED ADMIN — acetaminophen (TYLENOL) oral liquid: 1000 mg | ENTERAL | @ 04:00:00

## 2023-04-10 MED ADMIN — lidocaine (ASPERCREME) 4 % 1 patch: 1 | TRANSDERMAL | @ 15:00:00

## 2023-04-10 MED ADMIN — LORazepam (ATIVAN) tablet 1 mg: 1 mg | ORAL | @ 04:00:00

## 2023-04-10 MED ADMIN — famotidine (PEPCID) oral suspension: 20 mg | ENTERAL | @ 03:00:00

## 2023-04-10 MED ADMIN — HYDROmorphone (PF) (DILAUDID) injection 1 mg: 1 mg | INTRAVENOUS | @ 05:00:00 | Stop: 2023-04-10

## 2023-04-10 MED ADMIN — enoxaparin (LOVENOX) syringe 50 mg: 1 mg/kg | SUBCUTANEOUS | @ 03:00:00

## 2023-04-10 MED ADMIN — promethazine (PHENERGAN) 12.5 mg in sodium chloride (NS) 0.9 % 50 mL IVPB: 12.5 mg | INTRAVENOUS | @ 09:00:00

## 2023-04-10 MED ADMIN — acetaminophen (TYLENOL) oral liquid: 1000 mg | ENTERAL | @ 11:00:00

## 2023-04-10 MED ADMIN — scopolamine (TRANSDERM-SCOP) 1 mg over 3 days topical patch 1 mg: 1 | TOPICAL | @ 15:00:00

## 2023-04-10 MED ADMIN — escitalopram (LEXAPRO) oral solution: 5 mg | ENTERAL | @ 15:00:00

## 2023-04-10 MED ADMIN — pantoprazole (Protonix) injection 40 mg: 40 mg | INTRAVENOUS | @ 15:00:00

## 2023-04-10 MED ADMIN — guar gum (NUTRISOURCE) 1 packet: 1 | ENTERAL | @ 15:00:00

## 2023-04-10 MED ADMIN — oxyCODONE (ROXICODONE) 5 mg/5 mL solution 20 mg: 20 mg | ORAL | @ 23:00:00 | Stop: 2023-04-20

## 2023-04-10 MED ADMIN — sodium chloride (NS) 0.9 % flush 10 mL: 10 mL | INTRAVENOUS | @ 11:00:00

## 2023-04-10 MED ADMIN — pantoprazole (Protonix) injection 40 mg: 40 mg | INTRAVENOUS | @ 03:00:00

## 2023-04-10 MED ADMIN — HYDROmorphone (PF) (DILAUDID) injection 1 mg: 1 mg | INTRAVENOUS | @ 01:00:00 | Stop: 2023-04-11

## 2023-04-10 MED ADMIN — oxyCODONE (ROXICODONE) 5 mg/5 mL solution 20 mg: 20 mg | ORAL | @ 17:00:00 | Stop: 2023-04-20

## 2023-04-10 MED ADMIN — ammonium lactate (LAC-HYDRIN) 12 % lotion 1 Application: 1 | TOPICAL | @ 03:00:00

## 2023-04-10 MED ADMIN — oxyCODONE (ROXICODONE) 5 mg/5 mL solution 20 mg: 20 mg | ORAL | @ 07:00:00 | Stop: 2023-04-20

## 2023-04-10 MED ADMIN — OLANZapine zydis (ZYPREXA) disintegrating tablet 5 mg: 5 mg | ORAL | @ 23:00:00

## 2023-04-10 MED ADMIN — promethazine (PHENERGAN) 12.5 mg in sodium chloride (NS) 0.9 % 50 mL IVPB: 12.5 mg | INTRAVENOUS | @ 23:00:00

## 2023-04-10 MED ADMIN — promethazine (PHENERGAN) 12.5 mg in sodium chloride (NS) 0.9 % 50 mL IVPB: 12.5 mg | INTRAVENOUS | @ 04:00:00

## 2023-04-10 MED ADMIN — ondansetron (ZOFRAN) injection 8 mg: 8 mg | INTRAVENOUS | @ 03:00:00

## 2023-04-10 MED ADMIN — HYDROmorphone (PF) (DILAUDID) injection 1 mg: 1 mg | INTRAVENOUS | @ 19:00:00 | Stop: 2023-04-10

## 2023-04-10 MED ADMIN — enoxaparin (LOVENOX) syringe 50 mg: 1 mg/kg | SUBCUTANEOUS | @ 15:00:00

## 2023-04-10 MED ADMIN — ammonium lactate (LAC-HYDRIN) 12 % lotion 1 Application: 1 | TOPICAL | @ 15:00:00

## 2023-04-10 MED ADMIN — guar gum (NUTRISOURCE) 1 packet: 1 | ENTERAL | @ 19:00:00

## 2023-04-10 MED ADMIN — sodium chloride (NS) 0.9 % flush 10 mL: 10 mL | INTRAVENOUS | @ 19:00:00

## 2023-04-10 MED ADMIN — HYDROmorphone (PF) (DILAUDID) injection 1 mg: 1 mg | INTRAVENOUS | @ 15:00:00 | Stop: 2023-04-10

## 2023-04-10 MED ADMIN — HYDROmorphone (PF) (DILAUDID) injection 1 mg: 1 mg | INTRAVENOUS | @ 09:00:00 | Stop: 2023-04-10

## 2023-04-10 MED ADMIN — sodium chloride (NS) 0.9 % flush 10 mL: 10 mL | INTRAVENOUS | @ 03:00:00

## 2023-04-10 MED ADMIN — cefdinir (OMNICEF) oral suspension: 300 mg | ORAL | @ 17:00:00 | Stop: 2023-05-06

## 2023-04-10 MED ADMIN — ondansetron (ZOFRAN) injection 8 mg: 8 mg | INTRAVENOUS | @ 19:00:00

## 2023-04-10 MED ADMIN — multivitamin with folic acid 400 mcg tablet 1 tablet: 1 | ORAL | @ 15:00:00

## 2023-04-10 NOTE — Unmapped (Signed)
Physical Medicine and Rehabilitation  Inpatient Consult Note    Requesting Attending Physician: Joesph July, FNP  Service Requesting Consult: Med Bernita Raisin [facility])  Thank you for this consult.  Please contact the PM&R consult pager [phone]) for questions regarding these recommendations.  For questions of bed availability at [facility], contact the Intake Office at [phone]. Please contact your case manager for updates on AIR process as they are in regular contact with the rehab intake office and should have the latest information.    ASSESSMENT:   [name] [name] is a 23 y.o. female with past medical history of [name] s/p proctocolectomy and J-pouch, desmoid tumours, chronic abdominal pain and nausea, anxiety, PTSD related to past medical care, DVT RUE s/p Eliquis x3 months who is admitted 03/25/2023 for constellation of worsening pain, intolerance of oral intake.      RECOMMENDATIONS:  RUE weakness - Impaired ADLs - Fall Risk - Deconditioning - Impaired functional gait, mobility, balance, and endurance  - Please continue to have patient work with PT and OT to maximize functional status with mobility and ADLs.  - Fall risk precautions  - Recommend OOB to chair daily with assistance if appropriate    Bilateral Foot Drop  - Has AFO's but at home; mom said they would bring in as would be helpful for mobility    Chronic Pain  -Buprenorphine patch prescribed by Darlis Loan MD. Patient has additional patch supplies at home and will not require AIR prescription at DC. Follow up was requested by PMR team to be scheduled 14-21 days from AIR admit.  -Patient hopes to be weaned off of IV dilaudid by DC from AIR (currently q6)    Feeding Intolerance  -Patient's primary GI physician Dr. Marina Goodell will manage TF if still required at DC. Patient/mother have IV pump and feeding supplies at home.   -TF cyclic and at goal    - PM&R Outpatient Follow up: Please arrange Ambulatory PM&R follow up at [facility] for Rehabilitation Care with Dr. Carlena Hurl for bilateral foot drop ([address] [place], [place], phone: [phone]); scheduled for 05/11/2023 with Dr. Carlena Hurl     REHABILITATION PLAN OF CARE:  - Recommendation: ACUTE INPATIENT REHABILITATION (AIR).  - Patient/family agreed to [facility] AIR.    CHECKLIST FOR POTENTIAL ADMISSION TO [facility] AIR:   [ ]  EDD/what is estimated discharge date?  [ ]  PT/OT within 48 hours of AIR  [ ]  CBC/BMP & other pertinent labs within 48 hours of AIR  [ ]  Prefer oral pain meds only, but if on IV pain meds, then no more than q 6-8 hours prn    DISCHARGE PLAN AFTER AIR:  - patient will have assistance from family after discharge. Patient's mom at bedside and she and patient's dad and sister will assist at discharge.      AIR PLANNING:  *This PM&R consult does not guarantee that patient has been accepted to [facility] AIR, but can be helpful to guide patient progression.*    AIR patients have complex rehab, nursing, and medical needs and is appropriate for Acute Inpatient Rehabilitation.  In AIR the patient would be expected to tolerate minimum of 3 hrs of therapy daily, 5x/week.  While in AIR, the patient has medical issues requiring active management/supervision by a rehabilitation physician, with face-to-face visits at least 3 days per week to assess the patient both medically and functionally and to modify the course of treatment as needed; The Physiatrist leads an intensive and coordinated  interdisciplinary team approach to the delivery of inpatient rehabilitative care that cannot be provided in a less intensive setting such as the home, outpatient center or SNF. If not done in hospital, patient is at high risk for falls, respiratory failure, bleeding, infections, or delirium.    Discussed rehab options today with patient and mom at bedside and [facility] AIR handout provided. The patient/family/caregiver was counseled and educated on the purpose of Acute Inpatient Rehabilitation and questions/concerns were addressed.     In order for the patient to be considered for admission to [facility] inpatient rehab, the case manager must give the patient / family choice in post-acute discharge options. If the patient desires [place], a referral from case management is required for acceptance to [facility] inpatient rehab.  A referral for [facility] inpatient rehab has been received.     SUBJECTIVE   Reason for Consult: Patient seen in consultation at the request of Joesph July, FNP for evaluation of rehabilitation needs and recommendations.    History of Present Illness: [name] [name] is a 23 y.o. female with a past medical history of [name] s/p proctocolectomy and J-pouch, desmoid tumours, chronic abdominal pain and nausea, anxiety, PTSD related to past medical care, DVT RUE s/p Eliquis x3 months who is admitted 03/25/2023 for constellation of worsening pain, intolerance of oral intake.      Medical Complexity:    GAD - MDD - PTSD  - Psychology consulted and following  - Psychiatry consulted  - Lexapro 5 mg daily  - Mirtazapine 7.5 mg at bedtime   - Lorazepam 1 mg po bid prn  - Melatonin 3 mg at bedtime prn    Acute on Chronic Abdominal Pain  - Follows with [facility]; next appt 04/29/23  - Oxycodone solution 20 mg q 4 hours prn  - Tylenol solution 1000 mg q 8 hours  - Buprenorphine patch 20 mcg/hr weekly  - Lidocaine patch daily   - Dilaudid 1 mg IV q 6hours prn    Nausea - Dyspepsia  - no diphenhydramine IV in absence of clear indication   - Zofran 8 mg IV q 8 hours  - Zyprexa 5 mg every evening  - Phenergan 12.5 mg IV q 6 hours   - Scopolamine patch q 72 hours  - Famotidine 20 mg at bedtime   - Protonix 40 mg IV bid    [name] - Desmoid Tumors Follows with Dr. Johny Blamer Oncology  - Okay to hold Nirogacestat while having difficulty with oral meds, no urgency to resume per Dr [name]     [name] s/p total colectomy + ileal pouch   - GI consulted  - Continue GIB precautions while inpatient with appropriate access, at least daily CBC, active T&S   - Asked patient to maintain stool diary with photodocumentation of hematochezia   - No acute indication for repeat pouchoscopy, consider EGD this admission for follow up of [name]     Inadequate oral intake   - Dietician consulted  - on calorie count  - On regular diet + continuous tube feeds  - Guar gum for stool bulking tid  - Multivitamin with folic acid supplementation daily    Strep mitis bacteremia   - ID consulted  - s/p Vancomycin 1/16-1/22    Pouchitis (dx 03/2023), resolving   - No plan for repeat endoscopy or colonoscopy during this admission, per GI, given recent exam. No significant decrement in Hgb so far  - cefdinir 300 mg bid  to complete 4 weeks (end = 04/07/2023) then daily for 4 weeks (end = 05/05/2023)     Elevated transaminases  Improving: Today: AST 21, ALT 70 and Alk Phos down-trended to 132. No evidence of cholestasis or synthetic dysfunction. Presume drug-related though not sure from what culprit  - can stop following closely as trend is well established    Hx Bladder spasms  - Hyoscyamine 125 mcg q 4 hours prn    Risk for VTE - Hx RUE DVT: DVT ppx; she has h/o DVT RUE and was treated with Eliquis x 3 months; PADUA score=6 points      Today  Patient is lying in bed; mom at bedside. Patient is pleasant and A&O x 4. Patient denies CP/SOB, N/V/D, blurry vision, or HA. She reports abdominal pain.    Past Medical and Surgical History:   Past Medical History:   Diagnosis Date    Abdominal pain     Acid reflux     occas    Anesthesia complication     itching, shaking, coldness; last few surgeries have gone much better    Cancer (CMS-HCC)     Cataract of right eye     COVID-19 virus infection 01/2019    Cyst of thyroid determined by ultrasound     monitoring    [name]     2 right forearm, 1 left thigh, 1 right scapula, 1 under left clavicle; multiple    Difficult intravenous access     [name] (familial adenomatous polyposis)     [name]     Gastric polyps     History of chemotherapy     last treatment approx 05/2019    History of colon polyps     History of COVID-19 01/2019    Ileus (CMS-HCC) 03/16/2022    Iron deficiency anemia due to chronic blood loss     received iron infusion 11-2019    PONV (postoperative nausea and vomiting)     Rectal bleeding     Syncopal episodes     especially if becoming dehydrated     Past Surgical History:   Procedure Laterality Date    COLON SURGERY      cyroablation      cystis removal      desmoid removal      PR CLOSE ENTEROSTOMY,RESEC+ANAST N/A 10/09/2020    Procedure: ILEOSTOMY TAKEDOWN;  Surgeon: Mickle Asper, MD;  Location: OR [place];  Service: General Surgery    PR COLONOSCOPY W/BIOPSY SINGLE/MULTIPLE N/A 10/27/2012    Procedure: COLONOSCOPY, FLEXIBLE, PROXIMAL TO SPLENIC FLEXURE; WITH BIOPSY, SINGLE OR MULTIPLE;  Surgeon: Shirlyn Goltz Mir, MD;  Location: PEDS PROCEDURE ROOM [place];  Service: Gastroenterology    PR COLONOSCOPY W/BIOPSY SINGLE/MULTIPLE N/A 09/14/2013    Procedure: COLONOSCOPY, FLEXIBLE, PROXIMAL TO SPLENIC FLEXURE; WITH BIOPSY, SINGLE OR MULTIPLE;  Surgeon: Shirlyn Goltz Mir, MD;  Location: PEDS PROCEDURE ROOM [place];  Service: Gastroenterology    PR COLONOSCOPY W/BIOPSY SINGLE/MULTIPLE N/A 11/08/2014    Procedure: COLONOSCOPY, FLEXIBLE, PROXIMAL TO SPLENIC FLEXURE; WITH BIOPSY, SINGLE OR MULTIPLE;  Surgeon: Arnold Long Mir, MD;  Location: PEDS PROCEDURE ROOM [facility];  Service: Gastroenterology    PR COLONOSCOPY W/BIOPSY SINGLE/MULTIPLE N/A 12/26/2015    Procedure: COLONOSCOPY, FLEXIBLE, PROXIMAL TO SPLENIC FLEXURE; WITH BIOPSY, SINGLE OR MULTIPLE;  Surgeon: Arnold Long Mir, MD;  Location: PEDS PROCEDURE ROOM [facility];  Service: Gastroenterology    PR COLONOSCOPY W/BIOPSY SINGLE/MULTIPLE N/A 09/02/2017    Procedure: COLONOSCOPY, FLEXIBLE, PROXIMAL TO SPLENIC FLEXURE; WITH BIOPSY,  SINGLE OR MULTIPLE;  Surgeon: Arnold Long Mir, MD;  Location: PEDS PROCEDURE ROOM [facility];  Service: Gastroenterology    PR COLSC FLX W/REMOVAL LESION BY HOT BX FORCEPS N/A 08/27/2016    Procedure: COLONOSCOPY, FLEXIBLE, PROXIMAL TO SPLENIC FLEXURE; W/REMOVAL TUMOR/POLYP/OTHER LESION, HOT BX FORCEP/CAUTE;  Surgeon: Arnold Long Mir, MD;  Location: PEDS PROCEDURE ROOM [facility];  Service: Gastroenterology    PR COLSC FLX W/RMVL OF TUMOR POLYP LESION SNARE TQ N/A 02/25/2019    Procedure: COLONOSCOPY FLEX; W/REMOV TUMOR/LES BY SNARE;  Surgeon: Helyn Numbers, MD;  Location: GI PROCEDURES [place] [facility];  Service: Gastroenterology    PR COLSC FLX W/RMVL OF TUMOR POLYP LESION SNARE TQ N/A 03/13/2020    Procedure: COLONOSCOPY FLEX; W/REMOV TUMOR/LES BY SNARE;  Surgeon: Helyn Numbers, MD;  Location: [facility] PROCEDURES [place] [facility];  Service: Gastroenterology    PR EXC SKIN BENIG 2.1-3 CM TRUNK,ARM,LEG Right 02/25/2017    Procedure: EXCISION, BENIGN LESION INCLUDE MARGINS, EXCEPT SKIN TAG, LEGS; EXCISED DIAMETER 2.1 TO 3.0 CM;  Surgeon: Clarene Duke, MD;  Location: CHILDRENS OR [facility];  Service: Plastics    PR EXC SKIN BENIG 3.1-4 CM TRUNK,ARM,LEG Right 02/25/2017    Procedure: EXCISION, BENIGN LESION INCLUDE MARGINS, EXCEPT SKIN TAG, ARMS; EXCISED DIAMETER 3.1 TO 4.0 CM;  Surgeon: Clarene Duke, MD;  Location: CHILDRENS OR [facility];  Service: Plastics    PR EXC SKIN BENIG >4 CM FACE,FACIAL Right 02/25/2017    Procedure: EXCISION, OTHER BENIGN LES INCLUD MARGIN, FACE/EARS/EYELIDS/NOSE/LIPS/MUCOUS MEMBRANE; EXCISED DIAM >4.0 CM;  Surgeon: Clarene Duke, MD;  Location: CHILDRENS OR [facility];  Service: Plastics    PR EXC TUMOR SOFT TISSUE LEG/ANKLE SUBQ 3+CM Right 08/05/2019    Procedure: EXCISION, TUMOR, SOFT TISSUE OF LEG OR ANKLE AREA, SUBCUTANEOUS; 3 CM OR GREATER;  Surgeon: Arsenio Katz, MD;  Location: MAIN OR [place];  Service: Plastics    PR EXC TUMOR SOFT TISSUE LEG/ANKLE SUBQ <3CM Right 08/05/2019    Procedure: EXCISION, TUMOR, SOFT TISSUE OF LEG OR ANKLE AREA, SUBCUTANEOUS; LESS THAN 3 CM;  Surgeon: Arsenio Katz, MD; Location: MAIN OR [facility];  Service: Plastics    PR LAP, SURG PROCTECTOMY W J-POUCH N/A 08/10/2020    Procedure: ROBOTIC ASSISTED LAPAROSCOPIC PROCTOCOLECTOMY, ILEAL J POUCH, WITH OSTOMY;  Surgeon: Mickle Asper, MD;  Location: OR [place];  Service: General Surgery    PR NDSC EVAL INTSTINAL POUCH DX W/COLLJ SPEC SPX N/A 01/23/2021    Procedure: ENDO EVAL SM INTEST POUCH; DX;  Surgeon: Modena Nunnery, MD;  Location: [facility] PROCEDURES [place] [facility];  Service: Gastroenterology    PR NDSC EVAL INTSTINAL POUCH DX W/COLLJ SPEC SPX N/A 08/27/2021    Procedure: ENDO EVAL SM INTEST POUCH; DX;  Surgeon: Hunt Oris, MD;  Location: GI PROCEDURES [facility];  Service: Gastroenterology    PR NDSC EVAL INTSTINAL POUCH DX W/COLLJ SPEC SPX N/A 12/09/2021    Procedure: ENDO EVAL SM INTEST POUCH; DX;  Surgeon: Vidal Schwalbe, MD;  Location: GI PROCEDURES [facility];  Service: Gastroenterology    PR NDSC EVAL INTSTINAL POUCH DX W/COLLJ [facility] SPX Left 04/09/2022    Procedure: ENDO EVAL SM INTEST POUCH; DX;  Surgeon: Modena Nunnery, MD;  Location: [facility] [place] [facility];  Service: Gastroenterology    PR NDSC EVAL INTSTINAL POUCH DX W/COLLJ SPEC SPX N/A 08/05/2022    Procedure: ENDO EVAL SM INTEST POUCH; DX;  Surgeon: Modena Nunnery, MD;  Location: GI PROCEDURES [facility];  Service: Gastroenterology  PR NDSC EVAL INTSTINAL POUCH DX W/COLLJ SPEC SPX N/A 03/13/2023    Procedure: ENDO EVAL SM INTEST POUCH; DX;  Surgeon: Carmon Ginsberg, MD;  Location: GI PROCEDURES [facility];  Service: Gastroenterology    PR NDSC EVAL INTSTINAL POUCH W/BX SINGLE/MULTIPLE N/A 01/20/2022    Procedure: ENDOSCOPIC EVAL OF SMALL INTESTINAL POUCH; DIAGNOSTIC, No biopsies;  Surgeon: Andrey Farmer, MD;  Location: GI PROCEDURES [facility];  Service: Gastroenterology    PR NDSC EVAL INTSTINAL POUCH W/BX SINGLE/MULTIPLE N/A 02/13/2022    Procedure: ENDOSCOPIC EVAL OF SMALL INTESTINAL POUCH; DIAGNOSTIC, WITH BIOPSY;  Surgeon: Bronson Curb, MD;  Location: GI PROCEDURES [facility];  Service: Gastroenterology    PR NDSC EVAL INTSTINAL POUCH W/BX SINGLE/MULTIPLE N/A 03/13/2023    Procedure: ENDOSCOPIC EVAL OF SMALL INTESTINAL POUCH; DIAGNOSTIC, WITH BIOPSY;  Surgeon: Carmon Ginsberg, MD;  Location: GI PROCEDURES [facility];  Service: Gastroenterology    PR UNLISTED PROCEDURE SMALL INTESTINE  01/23/2021    Procedure: UNLISTED PROCEDURE, SMALL INTESTINE;  Surgeon: Modena Nunnery, MD;  Location: [facility] [place] [name];  Service: Gastroenterology    PR UNLISTED PROCEDURE SMALL INTESTINE  02/13/2022    Procedure: UNLISTED PROCEDURE, SMALL INTESTINE;  Surgeon: Bronson Curb, MD;  Location: GI PROCEDURES [facility];  Service: Gastroenterology    PR UPPER GI ENDOSCOPY,BIOPSY N/A 10/27/2012    Procedure: UGI ENDOSCOPY; WITH BIOPSY, SINGLE OR MULTIPLE;  Surgeon: Shirlyn Goltz Mir, MD;  Location: PEDS PROCEDURE ROOM [facility];  Service: Gastroenterology    PR UPPER GI ENDOSCOPY,BIOPSY N/A 09/14/2013    Procedure: UGI ENDOSCOPY; WITH BIOPSY, SINGLE OR MULTIPLE;  Surgeon: Shirlyn Goltz Mir, MD;  Location: PEDS PROCEDURE ROOM [facility];  Service: Gastroenterology    PR UPPER GI ENDOSCOPY,BIOPSY N/A 11/08/2014    Procedure: UGI ENDOSCOPY; WITH BIOPSY, SINGLE OR MULTIPLE;  Surgeon: Arnold Long Mir, MD;  Location: PEDS PROCEDURE ROOM [facility];  Service: Gastroenterology    PR UPPER GI ENDOSCOPY,BIOPSY N/A 12/26/2015    Procedure: UGI ENDOSCOPY; WITH BIOPSY, SINGLE OR MULTIPLE;  Surgeon: Arnold Long Mir, MD;  Location: PEDS PROCEDURE ROOM [facility];  Service: Gastroenterology    PR UPPER GI ENDOSCOPY,BIOPSY N/A 08/27/2016    Procedure: UGI ENDOSCOPY; WITH BIOPSY, SINGLE OR MULTIPLE;  Surgeon: Arnold Long Mir, MD;  Location: PEDS PROCEDURE ROOM [facility];  Service: Gastroenterology    PR UPPER GI ENDOSCOPY,BIOPSY N/A 09/02/2017    Procedure: UGI ENDOSCOPY; WITH BIOPSY, SINGLE OR MULTIPLE;  Surgeon: Arnold Long Mir, MD; Location: PEDS PROCEDURE ROOM [facility];  Service: Gastroenterology    PR UPPER GI ENDOSCOPY,BIOPSY N/A 03/13/2020    Procedure: UGI ENDOSCOPY; WITH BIOPSY, SINGLE OR MULTIPLE;  Surgeon: Helyn Numbers, MD;  Location: GI PROCEDURES [place] [facility];  Service: Gastroenterology    PR UPPER GI ENDOSCOPY,BIOPSY N/A 09/05/2021    Procedure: UGI ENDOSCOPY; WITH BIOPSY, SINGLE OR MULTIPLE;  Surgeon: Wendall Papa, MD;  Location: GI PROCEDURES [facility];  Service: Gastroenterology    PR UPPER GI ENDOSCOPY,DIAGNOSIS N/A 01/20/2022    Procedure: UGI ENDO, INCLUDE ESOPHAGUS, STOMACH, & DUODENUM &/OR JEJUNUM; DX W/WO COLLECTION SPECIMN, BY BRUSH OR WASH;  Surgeon: Andrey Farmer, MD;  Location: GI PROCEDURES [facility]  Service: Gastroenterology    TUMOR REMOVAL      multiple-head, neck, back, hand, right flank, multiple        Cancer History:  Hematology/Oncology History    No history exists.       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       Living Environment: House  Lives With: Father, Mother  Home Living: Multi-level home, Bed/bath upstairs, 1/2 bath on main level, Stairs to alternate level with rails, Stairs to enter without rails, Stairs to alternate level without rails, Handicapped height toilet, Tub/shower unit, Able to Live on main level with bedroom/bathroom  Number of Stairs to Enter (outside): 2  Rail placement (inside): Rail on right side  Number of Stairs to Alternate level (inside): 14  Caregiver Identified?: Yes  Caregiver Availability: 24 hours  Caregiver Ability: Limited lifting    Family History: Reviewed, noncontributory to rehab  family history includes Alcohol abuse in her paternal grandfather; Arthritis in her maternal grandfather; Asthma in her maternal grandfather; COPD in her paternal grandmother; Cancer in her maternal grandmother; Diabetes in her maternal grandmother; Hypertension in her maternal grandmother; Miscarriages / [place] in her paternal grandmother; No Known Problems in her brother, father, maternal aunt, maternal uncle, mother, paternal aunt, paternal uncle, and sister; Other in her maternal grandmother; Stroke in her maternal grandmother; Thyroid disease in her maternal grandmother.    Allergies:   Adhesive tape-silicones; Ferrlecit [sodium ferric gluconat-sucrose]; Levofloxacin; Methylnaltrexone; Neomycin; Papaya; Morphine; Zosyn [piperacillin-tazobactam]; Compazine [prochlorperazine]; Iron analogues; Reglan [metoclopramide hcl]; Iron dextran; and Latex, natural rubber    Medications:   Scheduled    acetaminophen (TYLENOL) oral liquid Q8H SCH    ammonium lactate (LAC-HYDRIN) 12 % lotion 1 Application BID    buprenorphine 20 mcg/hour transdermal patch 1 patch Weekly    cefdinir (OMNICEF) oral suspension Daily    cetirizine (ZYRTEC) oral syrup BID    [Provider Hold] cholecalciferol (vitamin D3 25 mcg (1,000 units)) tablet 25 mcg Daily    enoxaparin (LOVENOX) syringe 50 mg Q12H SCH    escitalopram (LEXAPRO) oral solution Daily    famotidine (PEPCID) oral suspension Nightly    fluticasone propionate (FLONASE) 50 mcg/actuation nasal spray 1 spray Weekly    guar gum (NUTRISOURCE) 1 packet TID    lidocaine (ASPERCREME) 4 % 1 patch Daily    multivitamin with folic acid 400 mcg tablet 1 tablet Daily    OLANZapine zydis (ZYPREXA) disintegrating tablet 5 mg Q PM    ondansetron (ZOFRAN) injection 8 mg Q8H    pantoprazole (Protonix) injection 40 mg BID    promethazine (PHENERGAN) 12.5 mg in sodium chloride (NS) 0.9 % 50 mL IVPB Q6H    scopolamine (TRANSDERM-SCOP) 1 mg over 3 days topical patch 1 mg Q72H    sodium chloride (NS) 0.9 % flush 10 mL Q8H     PRN diphenhydrAMINE, 12.5 mg, BID PRN  hydrocortisone, , BID PRN  hydrocortisone, , BID PRN  HYDROmorphone, 1 mg, Q4H PRN  hyoscyamine, 125 mcg, Q4H PRN  IP okay to treat, , Continuous PRN  IP okay to treat, , Continuous PRN  IP okay to treat, , Continuous PRN  loperamide, 2 mg, QID PRN  LORazepam, 1 mg, BID PRN  melatonin, 3 mg, Nightly PRN  naloxegol, 12.5 mg, Daily PRN  oxyCODONE, 20 mg, Q4H PRN  zinc oxide-cod liver oil, , Daily PRN      Continuous Infusions IP okay to treat  IP okay to treat  IP okay to treat        Review of Systems:    General ROS: negative  Psychological ROS: positive for - anxiety  Ophthalmic ROS: negative  Respiratory ROS: no cough, shortness of breath, or wheezing  Cardiovascular ROS: no chest pain or dyspnea on exertion  Gastrointestinal ROS: positive for - abdominal pain and nausea/vomiting   Genito-Urinary ROS: no dysuria, trouble voiding, or hematuria  Musculoskeletal ROS: positive for - gait disturbance and muscular weakness  Neurological ROS: positive for - dizziness, gait disturbance, impaired coordination/balance, and weakness negative for - headaches, speech problems, or visual changes  Dermatological ROS: negative for pruritus    Prior Functional Status:  Prior Functional Status: Pt reports recently she has been using rollator for household ambulation, WC for community distances. Has had 2 recent falls while ambulating. She was supposed to receive home PT services, however pt did not receive home services and her mother was driving her to outpatient PT appointments. She also reports being so weak recently that she has had to scoot up and down the stairs.    Current Functional Status: Therapy notes reviewed     Activities of Daily Living:   Assessment  Problem List: Decreased strength, Decreased activity tolerance, Decreased endurance, Decreased mobility, Impaired ADLs, Impaired balance, Pain  Clinical Decision Making: Moderate Complexity  Assessment: Pt presenting to OT session this date with the above listed deficits continuing to impact her safe/independent participation in ADLs/functional mobility. She is seen this date for progression with ST goals. Pt motivated to progress activity tolerance this date during standing ADL tasks at sink. She continues to require seated rest breaks. Pt increasing overall standing tolerance this date. She will continue to benefit from skilled acute OT to maximize functional performance and to decrease caregiver burden. OT continuing to recommend pt for 5x/weekly (high) at discharge.  Today's Interventions: Other  CHG (Chlorhexidine Gluconate) Treatment: Contraindicated  Today's Interventions: Interventions including bed mobility of supine <> sitting, sitting tolerance and balance to complete LB dressing tasks, functional mobility using RW, static and dynamic standing tasks to complete ADL tasks. Education provided regarding general safety with use of RW, energy conservation techniques for improved performance in ADL tasks.  ADLs  ADLs: Needs assistance with ADLs  ADLs - Needs Assistance: Feeding, Grooming, Bathing, Toileting, UB dressing, LB dressing  Feeding - Needs Assistance: Set Up Assist  Grooming - Needs Assistance: Contact Guard assist, Min assist  Bathing - Needs Assistance: Min assist, Print production planner - Needs Assistance: Min assist, Contact Guard assist  UB Dressing - Needs Assistance: Min assist, Contact Guard assist  LB Dressing - Needs Assistance: Min assist, Contact Guard assist           Mobility:   Bed Mobility comments: sup>sit with elevated HOB mod(A) for trunk management. sit>sup min(A) for BLE management. multimodal cues provided for sequencing movement to flat [facility]        Skilled Treatment Performed: pt amb ~31ft + ~38ft w/SBA w/RW. Pt demonstrates decreased stride length, shuffling-like steps, decreased foot clearance B; minimal knee flexion/ankle DF; slow gait speed; intermittent pause between steps. Cues provided for increased foot clearance, no overt LOB. Pt requires seated rest break after each bout of ambulation.  PT Post Acute Discharge Recommendations: Skilled PT services indicated, 5x weekly, High intensity    Cognition, Swallow, Speech:  Cognition / Swallow / Speech  Patient's Vision Adequate to Safely Complete Daily Activities: Recent Change  Patient's Judgement Adequate to Safely Complete Daily Activities: Recent Change  Patient's Memory Adequate to Safely Complete Daily Activities: Recent Change  Patient Able to Express Needs/Desires: Recent Change  Patient has speech problem: Recent change             Assistive Devices: Rollator, Wheelchair-manual, Rolling walker    Precautions:  Safety Interventions  Safety Interventions: fall reduction program maintained  Aspiration Precautions: awake/alert before oral intake, oral hygiene care promoted, upright posture maintained  Bleeding Precautions: blood pressure closely monitored, monitored for signs of bleeding  Infection Management: aseptic technique maintained  Isolation Precautions: protective precautions maintained  Latex Precautions: latex precautions maintained    OBJECTIVE:   Vitals:  Vitals:    04/09/23 0524 04/09/23 1518 04/09/23 2100 04/10/23 0620   BP: 125/66 111/68 140/84 111/67   Pulse: 73 84 90 80   Resp: 16 16 18 17    Temp: 36.4 ??C (97.5 ??F) 36.5 ??C (97.7 ??F) 36.8 ??C (98.2 ??F) 36.9 ??C (98.4 ??F)   TempSrc: Oral Oral Oral Oral   SpO2: 96% 98% 98% 97%   Weight:       Height:           Physical Exam:    GEN: Lying in bed in NAD.  HEENT: Atraumatic. Normocephalic. Moist mucous membranes. Trachea midline. Corpak intact  RESP: NWOB on RA.  CV: Regular rate and rhythm. No edema; negative Homan's sign BLE  GI: abd soft, ND  GU: deferred  PSYCH: Calm  SKIN: see wound care flow sheet  MSK: Positive for decreased range of motion and weakness  NEURO:  Mental Status: A&Ox 4 attention intact, speech fluid and coherent, follows commands well and answers questions appropriately  Cranial Nerve: No facial droop. Hearing grossly intact bilaterally. Shoulder shrug full and equal.    Motor:      Delt Bi Tri HG   (Delt= Deltoid, Bi= Biceps, Tri= Triceps, HG= Hand Grip)  RUE 3 3 3  3+     LUE 4- 4- 4- 4-                  HF KE       DF       PF        (HF= Hip flexor, KE= Knee Extensor, DF= Dorsiflexor, PF= Plantar Flexor)  RLE     3 3 1 1               LLE   3 3 1 1       Labs and Diagnostic Studies:    Personally reviewed Sunday Shams, MD    CBC - Results in Past 30 Days  Result Component Current Result Ref Range Previous Result Ref Range   HCT 33.2 (L) (04/06/2023) 34.0 - 44.0 % 33.5 (L) (04/02/2023) 34.0 - 44.0 %   HGB 11.0 (L) (04/06/2023) 11.3 - 14.9 g/dL 01.0 (L) (2/72/5366) 44.0 - 14.9 g/dL   MCH 34.7 (07/02/9561) 25.9 - 32.4 pg 26.9 (04/02/2023) 25.9 - 32.4 pg   MCHC 33.1 (04/06/2023) 32.0 - 36.0 g/dL 87.5 (6/43/3295) 18.8 - 36.0 g/dL   MCV 41.6 (08/14/3014) 77.6 - 95.7 fL 81.7 (04/02/2023) 77.6 - 95.7 fL   MPV 11.1 (H) (04/06/2023) 6.8 - 10.7 fL 10.6 (04/02/2023) 6.8 - 10.7 fL   Platelet 189 (04/06/2023) 150 - 450 10*9/L 186 (04/02/2023) 150 - 450 10*9/L   RBC 4.04 (04/06/2023) 3.95 -  5.13 10*12/L 4.10 (04/02/2023) 3.95 - 5.13 10*12/L   WBC 6.2 (04/06/2023) 3.6 - 11.2 10*9/L 9.4 (04/02/2023) 3.6 - 11.2 10*9/L     BMP - Results in Past 30 Days  Result Component Current Result Ref Range Previous Result Ref Range   BUN 14 (04/10/2023) 9 - 23 mg/dL 12 (1/61/0960) 9 - 23 mg/dL   Chloride 99 (4/54/0981) 98 - 107 mmol/L 102 (04/08/2023) 98 - 107 mmol/L   CO2 32.0 (H) (04/10/2023) 20.0 - 31.0 mmol/L 32.0 (H) (04/08/2023) 20.0 - 31.0 mmol/L   Creatinine 0.51 (L) (04/10/2023) 0.55 - 1.02 mg/dL 1.91 (L) (4/78/2956) 2.13 - 1.02 mg/dL   Glucose 96 (0/86/5784) 70 - 179 mg/dL 81 (6/96/2952) 70 - 841 mg/dL   Potassium 3.9 (05/31/4008) 3.4 - 4.8 mmol/L 4.1 (04/08/2023) 3.4 - 4.8 mmol/L   Sodium 142 (04/10/2023) 135 - 145 mmol/L 144 (04/08/2023) 135 - 145 mmol/L     LFT's - Results in Past 30 Days  Result Component Current Result Ref Range Previous Result Ref Range   Albumin 3.7 (04/10/2023) 3.4 - 5.0 g/dL 3.2 (L) (2/72/5366) 3.4 - 5.0 g/dL   Alkaline Phosphatase 120 (H) (04/10/2023) 46 - 116 U/L 103 (04/08/2023) 46 - 116 U/L   ALT 34 (04/10/2023) 10 - 49 U/L 45 (04/08/2023) 10 - 49 U/L   AST 20 (04/10/2023) <=34 U/L 17 (04/08/2023) <=34 U/L   Bilirubin, Direct <0.10 (03/17/2023) 0.00 - 0.30 mg/dL Not in Time Range    Total Bilirubin 0.2 (L) (04/10/2023) 0.3 - 1.2 mg/dL 0.2 (L) (4/40/3474) 0.3 - 1.2 mg/dL       Radiology Results:      No results found.    This patient was seen by the provider (Cher Rogelia Rohrer, AGNP).  This encounter was a an inpatient consultation.

## 2023-04-10 NOTE — Unmapped (Cosign Needed)
Department of Anesthesiology  Pain Medicine Division    Chronic Pain inpatient Consult follow up Note      Requesting Attending Physician:  Joesph July, FNP  Service Requesting Consult:  [facility])    Assessment/Recommendations:  The patient was seen in consultation on request of Joesph July, FNP regarding assistance with pain management. The patient is not obtaining adequate pain relief on current medication regimen.     [name] [name] is a 23 year old female with a complex medical history, including DVT (RUE), [name] syndrome (post-colectomy), desmoid tumors, anemia, and severe protein-calorie malnutrition. She is followed at [facility] for chronic abdominal and right shoulder pain, consistent with cancer-associated pain. 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 Her pain is further complicated by narcotic bowel syndrome, visceral hyperalgesia, and a positive feedback loop where increased opioid use exacerbates the pain. Bonnita???s previous hospitalization was complicated by ileus, requiring NG tube, a prolonged inpatient rehab stay.  During her most recent hospitalization she was found to have some pouchitis and was continued on oral antibiotic.      Interval: Patient doing okay this morning, reports continued difficulty with coping with her current situation. Specific concerns per patient include continued diarrhea, maroon colored stools, bruising around her LUE picc site, and tolerance of her new tube feeding formulation. After extensive discussion regarding her clinical course and anticipated transition to [facility] in the near future, patient was amenable to start weaning down her IV pain medications. We will start by spacing her Dilaudid to q6h. She is hopeful that [facility] will accept her with IV as needed pain medications, and states that they were amenable to this last time she was there. She is understanding that IV formulations will need to be weaned prior to discharge, ideally sooner to see what her oral requirements will be at home. She is having regular bowel movements, and working with physical therapy.     PRN medications: Oxycodone liquid 49m mg Q4H (used q4h), HM IV 1mg  Q4H (used q4h) last 24 hours.    Recommendations:  -The chronic pain service is a consult service and does not place orders, just makes recommendations (except ketamine and lidocaine infusions)  -Please evaluate all patients on opioids for appropriateness of prescribing narcan at discharge.  The chronic pain service can assist with this.  Nasal narcan covered by most insurance.  -Recommendations given apply to the current hospitalization and do not reflect long term recommendations.     Rx:   -Continue Butrans patch 20 mcg/h q. weekly  -Continue to support patient from multidisciplinary approach   -Continue oxycodone liquid 20 mg every 4 hours as needed (1st line therapy for severe pain), please encourage patient to utilize this modality, unless she has intractable vomiting despite antiemetic  -Decrease hydromorphone 1 mg IV to every 6 hours as needed (2nd line therapy for severe pain)  -Continue Tylenol 1 g 3 times daily  -Aggressively address nausea symptoms per primary team choice    We will continue to follow her. Chronic pain service will be available to contact via pager.     Please prescribe naloxone at discharge.  Nasal narcan for most insured (Nasal narcan 4mg /actuation, prescribe 1 kit, instructions at [email or web address]).  For uninsured, chronic pain can work  to assist in finding an option.  OTC nasal narcan now available at most pharmacies for around $45.    Allergies    Allergies   Allergen Reactions Adhesive Tape-Silicones Hives and Rash     Paper tape and Tegederm ok. Biopatch causes blistering but has tolerated CHG Tegaderm    Ferrlecit [Sodium Ferric Gluconat-Sucrose] Swelling and Rash    Levofloxacin Swelling and Rash     Swelling in mouth, rash,     Methylnaltrexone      Per Patient: I lost bowel control, severe abdominal cramping, and elevated BP    Neomycin Swelling     Rxn after ear drops; ear swelling    Papaya Hives    Morphine Nausea And Vomiting    Zosyn [Piperacillin-Tazobactam] Itching and Rash     Red and itchy    Compazine [Prochlorperazine] Other (See Comments)     Extreme agitation    Iron Analogues     Reglan [Metoclopramide Hcl] Diarrhea    Iron Dextran Itching     Received iron dextran 06/08/22 over 12 hours, had itching and redness/flushing during the infusion and for a couple days after. Required IV benadryl w/flares between doses and ultimately treated w/IV methylpred for 2 days.     Latex, Natural Rubber Rash       Home Medications    Medications Prior to Admission   Medication Sig Dispense Refill Last Dose/Taking    acetaminophen (TYLENOL) 500 MG tablet Take 2 tablets (1,000 mg total) by mouth every eight (8) hours.       buprenorphine (BUTRANS) 20 mcg/hour PTWK transdermal patch Place 1 patch on the skin every seven (7) days. 4 patch 1     cefdinir (OMNICEF) 300 MG capsule Take 1 capsule (300 mg total) by mouth every twelve (12) hours for 19 days, THEN 1 capsule (300 mg total) daily for 28 days. 66 capsule 0     COURIERED MED OR SUPPLY OGSVEO 50 MG TABLET. 1 each 0     escitalopram oxalate (LEXAPRO) 5 MG tablet Take 1 tablet (5 mg total) by mouth daily. 30 tablet 0     famotidine (PEPCID) 20 MG tablet Take 1 tablet (20 mg total) by mouth nightly. 30 tablet 3     fluticasone propionate (FLONASE) 50 mcg/actuation nasal spray Use 1 spray under the butrans patch once every 7 days to prevent allergic reaction to the adhesive       hydrocortisone 2.5 % cream Apply topically two (2) times a day as needed (hemorrhoids). 30 g 0     lidocaine (ASPERCREME) 4 % patch Place 1 patch on the skin daily as needed.       LORazepam (ATIVAN) 1 MG tablet Take 1 tablet (1 mg total) by mouth two (2) times a day as needed for anxiety. 60 tablet 0     naloxegol (MOVANTIK) 12.5 mg tablet Take 1 tablet (12.5 mg total) by mouth daily. (Patient taking differently: Take 1 tablet (12.5 mg total) by mouth daily as needed.) 30 tablet 0     naloxone (NARCAN) 4 mg nasal spray One spray in either nostril once for known/suspected opioid overdose. May repeat every 2-3 minutes in alternating nostril til EMS arrives 2 each 0     ondansetron (ZOFRAN-ODT) 8 MG disintegrating tablet Dissolve 1 tablet (8 mg total) in the mouth every eight (8) hours as needed for nausea. 90 tablet 0     [EXPIRED] oxyCODONE (ROXICODONE) 5 mg/5 mL solution Take 20 mL (20 mg  total) by mouth every four (4) hours as needed for up to 7 days. 840 mL 0     pantoprazole (PROTONIX) 40 MG tablet Take 1 tablet (40 mg total) by mouth daily before breakfast. 30 tablet 0     promethazine (PHENERGAN) 12.5 MG tablet Take 1 tablet (12.5 mg total) by mouth every twelve (12) hours as needed for nausea. 14 tablet 0     scopolamine (TRANSDERM-SCOP) 1 mg over 3 days Place 1 patch (1 mg total) on the skin every third day. 10 patch 2     sucralfate (CARAFATE) 100 mg/mL suspension Take 10 mL (1 g total) by mouth four (4) times a day as needed.       zinc oxide-cod liver oil (DESITIN 40%) 40 % Pste Apply topically daily as needed. 57 g 0        Inpatient Medications  Current Facility-Administered Medications   Medication Dose Route Frequency Provider Last Rate Last Admin    acetaminophen (TYLENOL) oral liquid  1,000 mg Enteral tube: post-pyloric (duodenum, jejunum) Q8H SCH [name], MD   1,000 mg at 04/10/23 0617    ammonium lactate (LAC-HYDRIN) 12 % lotion 1 Application  1 Application Topical BID [name], MD   1 Application at 04/10/23 0945    buprenorphine 20 mcg/hour transdermal patch 1 patch  1 patch Transdermal Weekly Dholakia, Vibhaben, AGNP   1 patch at 04/08/23 7829    cefdinir (OMNICEF) oral suspension  300 mg Oral Daily Dolores Patty, MD   300 mg at 04/10/23 1227    cetirizine (ZYRTEC) oral syrup  20 mg Enteral tube: post-pyloric (duodenum, jejunum) BID [name], MD   20 mg at 04/09/23 0901    [Provider Hold] cholecalciferol (vitamin D3 25 mcg (1,000 units)) tablet 25 mcg  25 mcg Oral Daily Dholakia, Vibhaben, AGNP        diphenhydrAMINE (BENADRYL) oral elixir  12.5 mg Oral BID PRN Inigo, Stellamary, FNP        enoxaparin (LOVENOX) syringe 50 mg  1 mg/kg Subcutaneous Q12H SCH Dholakia, Vibhaben, AGNP   50 mg at 04/10/23 0945    escitalopram (LEXAPRO) oral solution  5 mg Enteral tube: post-pyloric (duodenum, jejunum) Daily [name], MD   5 mg at 04/10/23 5621    famotidine (PEPCID) oral suspension  20 mg Enteral tube: post-pyloric (duodenum, jejunum) Nightly [name], MD   20 mg at 04/09/23 2153    fluticasone propionate (FLONASE) 50 mcg/actuation nasal spray 1 spray  1 spray Topical Weekly Dholakia, Vibhaben, AGNP   1 spray at 04/08/23 0927    guar gum (NUTRISOURCE) 1 packet  1 packet Enteral tube: post-pyloric (duodenum, jejunum) TID Dolores Patty, MD   1 packet at 04/10/23 0951    hydrocortisone 1 % cream   Topical BID PRN August Albino, MD   Given at 04/09/23 0100    hydrocortisone 2.5 % cream   Topical BID PRN Dholakia, Vibhaben, AGNP        HYDROmorphone (PF) (DILAUDID) injection 1 mg  1 mg Intravenous Q4H PRN [name], MD   1 mg at 04/10/23 3086    hyoscyamine (LEVSIN) tablet 125 mcg  125 mcg Oral Q4H PRN Dolores Patty, MD        IP OKAY TO TREAT   Other Continuous PRN Dholakia, Vibhaben, AGNP        IP OKAY TO TREAT   Other Continuous PRN Dholakia, Vibhaben, AGNP  IP OKAY TO TREAT   Other Continuous PRN Dholakia, Vibhaben, AGNP        lidocaine (ASPERCREME) 4 % 1 patch  1 patch Transdermal Daily Dholakia, Vibhaben, AGNP   1 patch at 04/10/23 0951    loperamide (IMODIUM) oral solution  2 mg Oral QID PRN Dolores Patty, MD        LORazepam (ATIVAN) tablet 1 mg  1 mg Oral BID PRN Joesph July, FNP   1 mg at 04/09/23 2317    melatonin tablet 3 mg  3 mg Oral Nightly PRN [name], MD        multivitamin with folic acid 400 mcg tablet 1 tablet  1 tablet Oral Daily [name], MD   1 tablet at 04/10/23 0950    naloxegol (MOVANTIK) 12.5 mg tablet 12.5 mg  12.5 mg Oral Daily PRN Dholakia, Vibhaben, AGNP        OLANZapine zydis (ZYPREXA) disintegrating tablet 5 mg  5 mg Oral Q PM Dolores Patty, MD   5 mg at 04/09/23 1810    ondansetron [facility]) injection 8 mg  8 mg Intravenous Q8H Dholakia, Vibhaben, AGNP   8 mg at 04/10/23 1610    oxyCODONE (ROXICODONE) 5 mg/5 mL solution 20 mg  20 mg Oral Q4H PRN Monna Fam, MD   20 mg at 04/10/23 1226    pantoprazole (Protonix) injection 40 mg  40 mg Intravenous BID Dolores Patty, MD   40 mg at 04/10/23 9604    promethazine (PHENERGAN) 12.5 mg in sodium chloride (NS) 0.9 % 50 mL IVPB  12.5 mg Intravenous Q6H [name], MD   Stopped at 04/10/23 1035    scopolamine (TRANSDERM-SCOP) 1 mg over 3 days topical patch 1 mg  1 patch Topical Q72H Dholakia, Vibhaben, AGNP   1 mg at 04/10/23 0951    sodium chloride (NS) 0.9 % flush 10 mL  10 mL Intravenous Q8H [name], MD   10 mL at 04/10/23 5409    zinc oxide-cod liver oil (DESITIN 40%) Paste   Topical Daily PRN Dolores Patty, MD             Past Medical History    Past Medical History:   Diagnosis Date    Abdominal pain     Acid reflux     occas    Anesthesia complication     itching, shaking, coldness; last few surgeries have gone much better    Cancer (CMS-HCC)     Cataract of right eye     COVID-19 virus infection 01/2019    Cyst of thyroid determined by ultrasound     monitoring    [name]     2 right forearm, 1 left thigh, 1 right scapula, 1 under left clavicle; multiple    Difficult intravenous access     [name] (familial adenomatous polyposis)     [name]     Gastric polyps     History of chemotherapy     last treatment approx 05/2019    History of colon polyps     History of COVID-19 01/2019    Ileus (CMS-HCC) 03/16/2022    Iron deficiency anemia due to chronic blood loss     received iron infusion 11-2019    PONV (postoperative nausea and vomiting)     Rectal bleeding     Syncopal episodes     especially if becoming dehydrated  Past Surgical History    Past Surgical History:   Procedure Laterality Date    COLON SURGERY      cyroablation      cystis removal      desmoid removal      PR CLOSE ENTEROSTOMY,RESEC+ANAST N/A 10/09/2020    Procedure: ILEOSTOMY TAKEDOWN;  Surgeon: Mickle Asper, MD;  Location: OR [place];  Service: General Surgery    PR COLONOSCOPY W/BIOPSY SINGLE/MULTIPLE N/A 10/27/2012    Procedure: COLONOSCOPY, FLEXIBLE, PROXIMAL TO SPLENIC FLEXURE; WITH BIOPSY, SINGLE OR MULTIPLE;  Surgeon: Shirlyn Goltz Mir, MD;  Location: PEDS PROCEDURE ROOM [facility];  Service: Gastroenterology    PR COLONOSCOPY W/BIOPSY SINGLE/MULTIPLE N/A 09/14/2013    Procedure: COLONOSCOPY, FLEXIBLE, PROXIMAL TO SPLENIC FLEXURE; WITH BIOPSY, SINGLE OR MULTIPLE;  Surgeon: Shirlyn Goltz Mir, MD;  Location: PEDS PROCEDURE ROOM [facility];  Service: Gastroenterology    PR COLONOSCOPY W/BIOPSY SINGLE/MULTIPLE N/A 11/08/2014    Procedure: COLONOSCOPY, FLEXIBLE, PROXIMAL TO SPLENIC FLEXURE; WITH BIOPSY, SINGLE OR MULTIPLE;  Surgeon: Arnold Long Mir, MD;  Location: PEDS PROCEDURE ROOM [facility];  Service: Gastroenterology    PR COLONOSCOPY W/BIOPSY SINGLE/MULTIPLE N/A 12/26/2015    Procedure: COLONOSCOPY, FLEXIBLE, PROXIMAL TO SPLENIC FLEXURE; WITH BIOPSY, SINGLE OR MULTIPLE;  Surgeon: Arnold Long Mir, MD;  Location: PEDS PROCEDURE ROOM [facility];  Service: Gastroenterology    PR COLONOSCOPY W/BIOPSY SINGLE/MULTIPLE N/A 09/02/2017    Procedure: COLONOSCOPY, FLEXIBLE, PROXIMAL TO SPLENIC FLEXURE; WITH BIOPSY, SINGLE OR MULTIPLE;  Surgeon: Arnold Long Mir, MD;  Location: PEDS PROCEDURE ROOM [place];  Service: Gastroenterology    PR COLSC FLX W/REMOVAL LESION BY HOT BX FORCEPS N/A 08/27/2016    Procedure: COLONOSCOPY, FLEXIBLE, PROXIMAL TO SPLENIC FLEXURE; W/REMOVAL TUMOR/POLYP/OTHER LESION, HOT BX FORCEP/CAUTE;  Surgeon: Arnold Long Mir, MD;  Location: PEDS PROCEDURE ROOM [facility];  Service: Gastroenterology    PR COLSC FLX W/RMVL OF TUMOR POLYP LESION SNARE TQ N/A 02/25/2019    Procedure: COLONOSCOPY FLEX; W/REMOV TUMOR/LES BY SNARE;  Surgeon: Helyn Numbers, MD;  Location: [facility] PROCEDURES [place] [facility];  Service: Gastroenterology    PR COLSC FLX W/RMVL OF TUMOR POLYP LESION SNARE TQ N/A 03/13/2020    Procedure: COLONOSCOPY FLEX; W/REMOV TUMOR/LES BY SNARE;  Surgeon: Helyn Numbers, MD;  Location: [facility] PROCEDURES [place] [facility];  Service: Gastroenterology    PR EXC SKIN BENIG 2.1-3 CM TRUNK,ARM,LEG Right 02/25/2017    Procedure: EXCISION, BENIGN LESION INCLUDE MARGINS, EXCEPT SKIN TAG, LEGS; EXCISED DIAMETER 2.1 TO 3.0 CM;  Surgeon: Clarene Duke, MD;  Location: CHILDRENS OR [facility];  Service: Plastics    PR EXC SKIN BENIG 3.1-4 CM TRUNK,ARM,LEG Right 02/25/2017    Procedure: EXCISION, BENIGN LESION INCLUDE MARGINS, EXCEPT SKIN TAG, ARMS; EXCISED DIAMETER 3.1 TO 4.0 CM;  Surgeon: Clarene Duke, MD;  Location: CHILDRENS OR [facility];  Service: Plastics    PR EXC SKIN BENIG >4 CM FACE,FACIAL Right 02/25/2017    Procedure: EXCISION, OTHER BENIGN LES INCLUD MARGIN, FACE/EARS/EYELIDS/NOSE/LIPS/MUCOUS MEMBRANE; EXCISED DIAM >4.0 CM;  Surgeon: Clarene Duke, MD;  Location: CHILDRENS OR [facility];  Service: Plastics    PR EXC TUMOR SOFT TISSUE LEG/ANKLE SUBQ 3+CM Right 08/05/2019    Procedure: EXCISION, TUMOR, SOFT TISSUE OF LEG OR ANKLE AREA, SUBCUTANEOUS; 3 CM OR GREATER;  Surgeon: Arsenio Katz, MD;  Location: MAIN OR [place];  Service: Plastics    PR EXC TUMOR SOFT TISSUE LEG/ANKLE SUBQ <3CM Right 08/05/2019    Procedure: EXCISION, TUMOR, SOFT TISSUE OF LEG OR ANKLE AREA, SUBCUTANEOUS; LESS THAN 3 CM;  Surgeon: Victorino Dike  Luna Glasgow, MD;  Location: MAIN OR [facility];  Service: Plastics    PR LAP, SURG PROCTECTOMY W J-POUCH N/A 08/10/2020    Procedure: ROBOTIC ASSISTED LAPAROSCOPIC PROCTOCOLECTOMY, ILEAL [place], WITH OSTOMY;  Surgeon: Mickle Asper, MD;  Location: OR [place];  Service: General Surgery    PR NDSC EVAL INTSTINAL POUCH DX W/COLLJ SPEC SPX N/A 01/23/2021    Procedure: ENDO EVAL SM INTEST POUCH; DX;  Surgeon: Modena Nunnery, MD;  Location: [facility] PROCEDURES [place] [facility];  Service: Gastroenterology    PR NDSC EVAL INTSTINAL POUCH DX W/COLLJ SPEC SPX N/A 08/27/2021    Procedure: ENDO EVAL SM INTEST POUCH; DX;  Surgeon: Hunt Oris, MD;  [facility]: [facility]URES [facility];  Service: Gastroenterology    PR NDSC EVAL INTSTINAL POUCH DX W/COLLJ SPEC SPX N/A 12/09/2021    Procedure: ENDO EVAL SM INTEST POUCH; DX;  Surgeon: Vidal Schwalbe, MD;  Location: GI PROCEDURES [facility];  Service: Gastroenterology    PR NDSC EVAL INTSTINAL POUCH DX W/COLLJ [facility] SPX Left 04/09/2022    Procedure: ENDO EVAL SM INTEST POUCH; DX;  Surgeon: Modena Nunnery, MD;  Location: [facility]EDURES [place] [facility];  Service: Gastroenterology    PR NDSC EVAL INTSTINAL POUCH DX W/COLLJ SPEC SPX N/A 08/05/2022    Procedure: ENDO EVAL SM INTEST POUCH; DX;  Surgeon: Modena Nunnery, MD;  Location: GI PROCEDURES [facility];  Service: Gastroenterology    PR NDSC EVAL INTSTINAL POUCH DX W/COLLJ SPEC SPX N/A 03/13/2023    Procedure: ENDO EVAL SM INTEST POUCH; DX;  Surgeon: Carmon Ginsberg, MD;  Location: GI PROCEDURES [facility];  Service: Gastroenterology    PR NDSC EVAL INTSTINAL POUCH W/BX SINGLE/MULTIPLE N/A 01/20/2022    Procedure: ENDOSCOPIC EVAL OF SMALL INTESTINAL POUCH; DIAGNOSTIC, No biopsies;  Surgeon: Andrey Farmer, MD;  Location: GI PROCEDURES [facility];  Service: Gastroenterology    PR NDSC EVAL INTSTINAL POUCH W/BX SINGLE/MULTIPLE N/A 02/13/2022    Procedure: ENDOSCOPIC EVAL OF SMALL INTESTINAL POUCH; DIAGNOSTIC, WITH BIOPSY;  Surgeon: Bronson Curb, MD;  Location: GI PROCEDURES [facility];  Service: Gastroenterology    PR NDSC EVAL INTSTINAL POUCH W/BX SINGLE/MULTIPLE N/A 03/13/2023    Procedure: ENDOSCOPIC EVAL OF SMALL INTESTINAL POUCH; DIAGNOSTIC, WITH BIOPSY;  Surgeon: Carmon Ginsberg, MD;  Location: GI PROCEDURES [facility];  Service: Gastroenterology    PR UNLISTED PROCEDURE SMALL INTESTINE  01/23/2021    Procedure: UNLISTED PROCEDURE, SMALL INTESTINE;  Surgeon: Modena Nunnery, MD;  Location: GI PROCEDURES [place] [facility];  Service: Gastroenterology    PR UNLISTED PROCEDURE SMALL INTESTINE  02/13/2022    Procedure: UNLISTED PROCEDURE, SMALL INTESTINE;  Surgeon: Bronson Curb, MD;  Location: GI PROCEDURES [facility];  Service: Gastroenterology    PR UPPER GI ENDOSCOPY,BIOPSY N/A 10/27/2012    Procedure: UGI ENDOSCOPY; WITH BIOPSY, SINGLE OR MULTIPLE;  Surgeon: Shirlyn Goltz Mir, MD;  Location: PEDS PROCEDURE ROOM [facility];  Service: Gastroenterology    PR UPPER GI ENDOSCOPY,BIOPSY N/A 09/14/2013    Procedure: UGI ENDOSCOPY; WITH BIOPSY, SINGLE OR MULTIPLE;  Surgeon: Shirlyn Goltz Mir, MD;  Location: PEDS PROCEDURE ROOM [facility];  Service: Gastroenterology    PR UPPER GI ENDOSCOPY,BIOPSY N/A 11/08/2014    Procedure: UGI ENDOSCOPY; WITH BIOPSY, SINGLE OR MULTIPLE;  Surgeon: Arnold Long Mir, MD;  Location: PEDS PROCEDURE ROOM [facility];  Service: Gastroenterology    PR UPPER GI ENDOSCOPY,BIOPSY N/A 12/26/2015    Procedure: UGI ENDOSCOPY; WITH BIOPSY, SINGLE OR MULTIPLE;  Surgeon: Arnold Long Mir, MD;  Location: PEDS PROCEDURE ROOM [facility];  Service: Gastroenterology    PR UPPER GI ENDOSCOPY,BIOPSY N/A 08/27/2016    Procedure: UGI ENDOSCOPY; WITH BIOPSY, SINGLE OR MULTIPLE;  Surgeon: Arnold Long Mir, MD;  Location: PEDS PROCEDURE ROOM [facility];  Service: Gastroenterology    PR UPPER GI ENDOSCOPY,BIOPSY N/A 09/02/2017    Procedure: UGI ENDOSCOPY; WITH BIOPSY, SINGLE OR MULTIPLE;  Surgeon: Arnold Long Mir, MD;  Location: PEDS PROCEDURE ROOM [facility];  Service: Gastroenterology    PR UPPER GI ENDOSCOPY,BIOPSY N/A 03/13/2020    Procedure: UGI ENDOSCOPY; WITH BIOPSY, SINGLE OR MULTIPLE;  Surgeon: Helyn Numbers, MD;  Location: [facility] PROCEDURES [place] [facility];  Service: Gastroenterology    PR UPPER GI ENDOSCOPY,BIOPSY N/A 09/05/2021    Procedure: UGI ENDOSCOPY; WITH BIOPSY, SINGLE OR MULTIPLE;  Surgeon: Wendall Papa, MD;  Location: GI PROCEDURES [facility];  Service: Gastroenterology    PR UPPER GI ENDOSCOPY,DIAGNOSIS N/A 01/20/2022    Procedure: UGI ENDO, INCLUDE ESOPHAGUS, STOMACH, & DUODENUM &/OR JEJUNUM; DX W/WO COLLECTION SPECIMN, BY BRUSH OR WASH;  Surgeon: Andrey Farmer, MD;  Location: GI PROCEDURES [facility];  Service: Gastroenterology    TUMOR REMOVAL      multiple-head, neck, back, hand, right flank, multiple           Family History    Family History   Problem Relation Age of Onset    No Known Problems Mother     No Known Problems Father     No Known Problems Sister     No Known Problems Brother     Stroke Maternal Grandmother     Other Maternal Grandmother         benign lesions of liver and pancreas, further details unknown    Cancer Maternal Grandmother     Diabetes Maternal Grandmother     Hypertension Maternal Grandmother     Thyroid disease Maternal Grandmother     Arthritis Maternal Grandfather     Asthma Maternal Grandfather     COPD Paternal Grandmother         Deceased    Miscarriages / Stillbirths Paternal Grandmother     Alcohol abuse Paternal Grandfather         Deceased    No Known Problems Maternal Aunt     No Known Problems Maternal Uncle     No Known Problems Paternal Aunt     No Known Problems Paternal Uncle     Anesthesia problems Neg Hx     Broken bones Neg Hx     Cancer Neg Hx     Clotting disorder Neg Hx     Collagen disease Neg Hx     Diabetes Neg Hx     Dislocations Neg Hx     Fibromyalgia Neg Hx     Gout Neg Hx     Hemophilia Neg Hx     Osteoporosis Neg Hx     Rheumatologic disease Neg Hx     Scoliosis Neg Hx     Severe sprains Neg Hx     Sickle cell anemia Neg Hx     Spinal Compression Fracture Neg Hx     Melanoma Neg Hx     Basal cell carcinoma Neg Hx     Squamous cell carcinoma Neg Hx          Social History:  Social History     Tobacco Use   Smoking Status Never    Passive exposure: Past   Smokeless Tobacco Never  Social History     Substance and Sexual Activity   Alcohol Use Never     Social History     Substance and Sexual Activity   Drug Use Never         Lab Results   Component Value Date    CREATININE 0.51 (L) 04/10/2023       Lab Results   Component Value Date    ALKPHOS 120 (H) 04/10/2023    BILITOT 0.2 (L) 04/10/2023    BILIDIR <0.10 03/17/2023    PROT 7.0 04/10/2023    ALBUMIN 3.7 04/10/2023    ALT 34 04/10/2023    AST 20 04/10/2023       Encounter Date: 03/25/23   ECG 12 Lead   Result Value    EKG Systolic BP     EKG Diastolic BP     EKG Ventricular Rate 96    EKG Atrial Rate 96    EKG P-R Interval 134    EKG QRS Duration 78    EKG Q-T Interval 364    EKG QTC Calculation 459    EKG Calculated P Axis 36    EKG Calculated R Axis 40    EKG Calculated T Axis 10    QTC Fredericia 425    Narrative    NORMAL SINUS RHYTHM  NONSPECIFIC T WAVE ABNORMALITY  ABNORMAL ECG  WHEN COMPARED WITH ECG OF 28-Mar-2023 12:13,  NO SIGNIFICANT CHANGE WAS FOUND  Confirmed by Eldred Manges [phone]) on 04/03/2023 6:13:01 PM       No results found for requested labs within last 30 days.       Objective:     Vital Signs  Temp:  [36.5 ??C (97.7 ??F)-36.9 ??C (98.4 ??F)] 36.9 ??C (98.4 ??F)  Heart Rate:  [80-90] 80  Resp:  [16-18] 17  BP: (111-140)/(67-84) 111/67  MAP (mmHg):  [81-100] 81  SpO2:  [97 %-98 %] 97 %    Physical Exam    GENERAL:  Well developed, well-nourished female and is in no apparent distress. Sitting in bed comfortably   HEAD/NECK: Reveals normocephalic/atraumatic. Corpak tube in place  CARDIOVASCULAR:   Regular rate well perfused  LUNGS:   Normal work of breathing, no supplemental 02  EXTREMITIES:  Warm, no clubbing, cyanosis, or edema was noted.  SKIN:  No obvious rashes lesions or erythema        Problem List    Principal Problem:    Intractable abdominal pain  Active Problems:    [name]    Cancer associated pain    Pain medication agreement signed    Pouchitis (CMS-HCC)    Elevated liver transaminase level    Positive blood culture

## 2023-04-10 NOTE — Unmapped (Addendum)
Pt is a/ox4, vss on room air at afternoon assessment. Pt complained of frequent loose stools with incontinence in the morning that she suspects is related to new feed formula she is receiving. Symptoms lessened after morning guar gum. Pt stated she skipped dose of guar gum yesterday and wants to be strict with adhering to 3x/day regimen before considering anti-diarrheal. She would prefer anti-diarrheal as last resort due to hx of ileus. Oxycodone and dilaudid prn given x3 and x2 for chronic cancer pain mainly in abdomen. Dilaudid changed from q4 to q6 in preparation for transfer to [facility]. Pt states nausea is controlled well on regimen of staggered phenergan and zofran. PO intake remains very poor. Continuing cyclic Nutren 2.0 at 50, restarted in the evening. Mother at bedside, with additional family visitors later in the evening. Pt is ambulatory, x1 assist with walker, personal wheelchair also at bedside for intermittent use. Will continue plan of care.    Problem: Adult Inpatient Plan of Care  Goal: Plan of Care Review  Outcome: Progressing  Goal: Patient-Specific Goal (Individualized)  Outcome: Progressing  Goal: Absence of Hospital-Acquired Illness or Injury  Outcome: Progressing  Goal: Optimal Comfort and Wellbeing  Outcome: Progressing  Goal: Readiness for Transition of Care  Outcome: Progressing  Goal: Rounds/Family Conference  Outcome: Progressing     Problem: Infection  Goal: Absence of Infection Signs and Symptoms  Outcome: Progressing     Problem: Latex Allergy  Goal: Absence of Allergy Symptoms  Outcome: Progressing     Problem: Oncology Care  Goal: Effective Coping  Outcome: Progressing  Goal: Improved Activity Tolerance  Outcome: Progressing  Goal: Optimal Oral Intake  Outcome: Progressing  Goal: Improved Oral Mucous Membrane Integrity  Outcome: Progressing  Goal: Optimal Pain Control and Function  Outcome: Progressing     Problem: Self-Care Deficit  Goal: Improved Ability to Complete Activities of Daily Living  Outcome: Progressing     Problem: Fall Injury Risk  Goal: Absence of Fall and Fall-Related Injury  Outcome: Progressing     Problem: Skin Injury Risk Increased  Goal: Skin Health and Integrity  Outcome: Progressing     Problem: Pain Acute  Goal: Optimal Pain Control and Function  Outcome: Progressing

## 2023-04-10 NOTE — Unmapped (Addendum)
Problem: Adult Inpatient Plan of Care  Goal: Plan of Care Review  Outcome: Progressing  Flowsheets (Taken 04/10/2023 [id number])  Progress: improving  Plan of Care Reviewed With: patient  Note:  Goal: Patient-Specific Goal (Individualized)  Outcome: Progressing  Goal: Absence of Hospital-Acquired Illness or Injury  Outcome: Progressing  Intervention: Identify and Manage Fall Risk  Recent Flowsheet Documentation  Taken 04/10/2023 0200 by Jenel Lucks, RN  Safety Interventions:   fall reduction program maintained   lighting adjusted for tasks/safety   low bed   nonskid shoes/slippers when out of bed  Taken 04/09/2023 2200 by Jenel Lucks, RN  Safety Interventions:   fall reduction program maintained   low bed  Taken 04/09/2023 2000 by Jenel Lucks, RN  Safety Interventions:   fall reduction program maintained   low bed  Intervention: Prevent Infection  Recent Flowsheet Documentation  Taken 04/10/2023 0200 by Jenel Lucks, RN  Infection Prevention: hand hygiene promoted  Taken 04/09/2023 2200 by Jenel Lucks, RN  Infection Prevention: hand hygiene promoted  Taken 04/09/2023 2000 by Jenel Lucks, RN  Infection Prevention: hand hygiene promoted  Goal: Optimal Comfort and Wellbeing  Outcome: Progressing  Goal: Readiness for Transition of Care  Outcome: Progressing  Goal: Rounds/Family Conference  Outcome: Progressing     Problem: Infection  Goal: Absence of Infection Signs and Symptoms  Outcome: Progressing  Intervention: Prevent or Manage Infection  Recent Flowsheet Documentation  Taken 04/10/2023 0200 by Jenel Lucks, RN  Infection Management: aseptic technique maintained  Taken 04/09/2023 2200 by Jenel Lucks, RN  Infection Management: aseptic technique maintained  Taken 04/09/2023 2000 by Jenel Lucks, RN  Infection Management: aseptic technique maintained     Problem: Latex Allergy  Goal: Absence of Allergy Symptoms  Outcome: Progressing     Problem: Oncology Care  Goal: Effective Coping  Outcome: Progressing  Goal: Improved Activity Tolerance  Outcome: Progressing  Goal: Optimal Oral Intake  Outcome: Progressing  Goal: Improved Oral Mucous Membrane Integrity  Outcome: Progressing  Goal: Optimal Pain Control and Function  Outcome: Progressing     Problem: Self-Care Deficit  Goal: Improved Ability to Complete Activities of Daily Living  Outcome: Progressing     Problem: Fall Injury Risk  Goal: Absence of Fall and Fall-Related Injury  Outcome: Progressing  Intervention: Promote Injury-Free Environment  Recent Flowsheet Documentation  Taken 04/10/2023 0200 by Jenel Lucks, RN  Safety Interventions:   fall reduction program maintained   lighting adjusted for tasks/safety   low bed   nonskid shoes/slippers when out of bed  Taken 04/09/2023 2200 by Jenel Lucks, RN  Safety Interventions:   fall reduction program maintained   low bed  Taken 04/09/2023 2000 by Jenel Lucks, RN  Safety Interventions:   fall reduction program maintained   low bed     Problem: Skin Injury Risk Increased  Goal: Skin Health and Integrity  Outcome: Progressing     Problem: Pain Acute  Goal: Optimal Pain Control and Function  Outcome: Progressing

## 2023-04-10 NOTE — Unmapped (Signed)
Daily Progress Note    Assessment/Plan:    Principal Problem:    Intractable abdominal pain  Active Problems:    [name]    Cancer associated pain    Pain medication agreement signed    Pouchitis (CMS-HCC)    Elevated liver transaminase level    Positive blood culture   Malnutrition Evaluation as performed by RD, LDN: Severe Protein-Calorie Malnutrition in the context of chronic illness (03/27/23 1311)             [name] [name] is a 23 y.o. 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      # Acute on chronic abdominal pain and nausea  Complex situation discussed at length in prior notes. In brief, her pain and nausea are (deeply) multifactorial but consensus among treatment team is that new / unknown organic issue is unlikely. Plan to focus on symptom control, adequate nutrition.  Patient is currently at goal tube feeding, tolerating well.  No visible emesis noted.  Still reports persistent nausea but on aggressive nausea management as below.  -- chronic pain service consulted, appreciated  -- NO CHANGES to pain regimen without multiparty discussion including patient, unless true emergency.  Seen by chronic pain today 1/28, recommended to continue the pain regimen as below no changes made.  -- for pain  -- oxycodone (liquid) 20 mg q4 prn-first-line for pain  -- hydromorphone 1 mg IV q4 prn -second line for pain  (ONLY IF inadequate relief 1 hour after oral alternative)  -- buprenorphine 20 mcg/h patch   -- hyoscyamine prn  -- for nausea, discuss about transitioning one of the IV nausea medication to p.o..  Patient declined and started crying.  -- ondansetron IV q8 scheduled  -- promethazine IV q6 scheduled (not as push)  -- scopolamine patch  -- olanzapine 5 mg in evening  -- no diphenhydramine IV in absence of clear indication  -- for dyspepsia  -- famotidine bid  -- PPI IV q12  -- for motility  -- naloxegol prn  -- okay to use loperamide sparingly      # Left arm PICC line site swelling, pain and ecchymosis: Left arm PICC line site noted mild swelling and mild ecchymosis from insertion.  No visible swelling noted below the elbow.  PVL confirmed acute DVT, started on anticoagulation and removed PICC line on 1/28, given acute DVT also patient is currently not on any IV antibiotic.    -Removed PICC line 1/28,  swelling around prior picc line site improving, mild redness/ rash  due to tape, still has bruises bur resolving.   -pt has received IV benadryl overnight for itchiness around picc line site, declined liquid benadryl- as pt stated that it does not work for her and it causes GI tract irritation. Reassured and explained to her that no indication/ medical necessity for IV benadryl. Pt still repeatedly stating that IV benadryl works for her.     # Inadequate oral intake  Abnormal gut anatomy notwithstanding, it is the consensus among hospitalist and GI services that oral and/or enteral feeding should be sufficient to meet caloric and hydration needs. Has not developed refeeding syndrome.   -- TPN will not be offered in forseeable future  -- titrate tube feeds to ~100% USRDI = 65 mL/h: reached at 60cc/hr- tolerated well.   --Per dietitian, patient  requested for cyclic feeding , started on cyclic feeding with Nutren 2.0, at goal rate 50 cc/h for 16 hours with acute 4-hour water flushes 150 cc .   -- appreciate calorie count per nutrition, is eating a little but not calorically meaningful  -- try adding fiber supplement to help with stool bulk  -- follow BMP, Mg phos q48-72, stop once at goal feeds  -- GI to follow and manage wean from enteral nutrition at discharge     # Elevated transaminases  Improving: Labs with AST 21, ALT 70, downtrending from prior.. No evidence of cholestasis or synthetic dysfunction. Presume drug-related though not sure from what culprit  -- can stop following closely as trend is well established     # Pouchitis (dx 03/2023), resolving  No plan for repeat endoscopy or colonoscopy during this admission, per GI, given recent exam. No significant decrement in Hb so far  -- cefdinir 300 mg bid to complete 4 weeks (end = 04/07/2023) then daily for 4 weeks (end = 05/05/2023)  -- CBC weekly     # Desmoid tumors  -- okay to hold nirogesat while having difficulty with oral meds, no urgency to resume per Dr [name]     # Mood / anxiety  -- continue escitalopram (has been taking),  - Discontinued mirtazipine (as pt has been declining)-1/29  -- lorazepam 1 mg bid prn (home medication)     Daily checklist:  VTE prophylaxis: low risk, PADUA score < 4; encourage ambulation  Diet: Nutrition Therapy Regular/House  Osmolite 1.5 (Standard 1.5 Cal) continuous tube feed  Supplement Pediatric; Pediasure (PED Standard); # of Products PER Serving: 1 2xd PC  Code status: Full Code  HCDM:   HCDM ([place] policy, no known patient preference): Chiasson,[name] - Mother - [phone]    HCDM, back-up (If primary HCDM is unavailable): Kazmierski,Jeff - Father - [phone]     Disposition: Floor status. She is interested in [facility] again at discharge; PT/OT notes support, consult placed for candidacy. Primary discharge goal is tube feeding at goal rate. Further medication changes would depend on discharge disposition and what could be accommodated at AIR if that is her destination.  -Pending insurance authorization for AIR      PT/OT recommendations: Skilled PT services indicated, 5x weekly, High intensity (04/02/23) / 5x weekly, High intensity (04/02/23)  DME needs: None (04/02/23) / Defer to post acute (04/02/23)     I personally spent 45 minutes face-to-face and non-face-to-face in the care of this patient, which includes all pre, intra, and post visit time on the date of service.  All documented time was specific to the E/M visit and does not include any procedures that may have been performed.      ___________________________________________________________________    Subjective:  No acute events overnight, Patient states pain well controlled, Denies CP or SOB, Tolerating diet without difficulty    No results found for this or any previous visit (from the past 24 hours).  Labs/Studies:  Labs and Studies from the last 24hrs per EMR and Reviewed    Objective:  Temp:  [36.5 ??C (97.7 ??F)-36.9 ??C (98.4 ??F)] 36.9 ??C (98.4 ??F)  Heart Rate:  [80-90] 80  Resp:  [16-18] 17  BP: (111-140)/(67-84) 111/67  SpO2:  [97 %-98 %] 97 %    General: laying in bed,  alert, conversant, cooperative, and in no distress.  Eyes: No scleral icterus. Clear conjunctivae. EOMi.  ENT: Normal appearing nose.small bore ND tube left nostril,  Oropharyngeal mucus  membranes moist & pink.  Neck: No lymphadenopathy.  Cardiovascular: Regular rate and rhythm. No murmur.  Left arm PICC line site, swelling improving, mild redness/ rash aroung the dressing site due to tape, still has bruises bur resolving.  Extremities: Warm to touch. Regular 2+ radial pulses bilaterally. No LE edema.  Respiratory: Normal respiratory effort on room air Clear to auscultation bilaterally.  Gastrointestinal: Soft, non-tender, non-distended with normoactive bowel sounds.  Neurologic: Alert and fully oriented. Normal speech and language. CAM negative. Moving all extremities purposefully. Normal gait.  Skin: Skin is warm, dry and intact. No rash.  Psychiatric:  Mood and affect are normal. Speech and behavior are normal.

## 2023-04-10 NOTE — Unmapped (Signed)
Daily Progress Note    Assessment/Plan:    Principal Problem:    Intractable abdominal pain  Active Problems:    [name]    Cancer associated pain    Pain medication agreement signed    Pouchitis (CMS-HCC)    Elevated liver transaminase level    Positive blood culture   Malnutrition Evaluation as performed by RD, LDN: Severe Protein-Calorie Malnutrition in the context of chronic illness (03/27/23 1311)             [name] [name] is a 23 y.o. 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      # Acute on chronic abdominal pain and nausea  Complex situation discussed at length in prior notes. In brief, her pain and nausea are (deeply) multifactorial but consensus among treatment team is that new / unknown organic issue is unlikely. Plan to focus on symptom control, adequate nutrition.  Patient is currently at goal tube feeding, tolerating well.  No visible emesis noted.  Still reports persistent nausea but on aggressive nausea management as below.  -- chronic pain service consulted, appreciated  -- NO CHANGES to pain regimen without multiparty discussion including patient, unless true emergency.  Seen by chronic pain today 1/28, recommended to continue the pain regimen as below no changes made.  -- for pain  -- oxycodone (liquid) 20 mg q4 prn-first-line for pain  -- changed hydromorphone 1 mg IV q6prn -second line for pain  (ONLY IF inadequate relief 1 hour after oral alternative)  -- buprenorphine 20 mcg/h patch   -- hyoscyamine prn  -- for nausea, discuss about transitioning one of the IV nausea medication to p.o..  Patient declined and started crying.  -- ondansetron IV q8 scheduled  -- promethazine IV q6 scheduled (not as push)  -- scopolamine patch  -- olanzapine 5 mg in evening  -- no diphenhydramine IV in absence of clear indication  -- for dyspepsia  -- famotidine bid  -- PPI IV q12  -- for motility  -- naloxegol prn  -- okay to use loperamide sparingly      # Left arm PICC line site swelling, pain and ecchymosis: Left arm PICC line site noted mild swelling and mild ecchymosis from insertion.  No visible swelling noted below the elbow.  PVL confirmed acute DVT, started on anticoagulation and removed PICC line on 1/28, given acute DVT also patient is currently not on any IV antibiotic.    -Removed PICC line 1/28,  swelling around prior picc line site improving, mild redness/ rash  due to tape, still has bruises bur resolving.   -pt has received IV benadryl overnight for itchiness around picc line site, declined liquid benadryl- as pt stated that it does not work for her and it causes GI tract irritation. Reassured and explained to her that no indication/ medical necessity for IV benadryl. Pt still repeatedly stating that IV benadryl works for her.     # Inadequate oral intake  Abnormal gut anatomy notwithstanding, it is the consensus among hospitalist and GI services that oral and/or enteral feeding should be sufficient to meet caloric and hydration needs. Has not developed refeeding syndrome.   -- TPN will not be offered in forseeable future  -- titrate tube feeds to ~100% USRDI = 65 mL/h: reached at 60cc/hr- tolerated well.   --Per dietitian, patient  requested for cyclic feeding , started on cyclic feeding with Nutren 2.0, at goal rate 50 cc/h for 16 hours with acute 4-hour water flushes 150 cc .   -- appreciate calorie count per nutrition, is eating a little but not calorically meaningful  -- try adding fiber supplement to help with stool bulk  -- follow BMP, Mg phos q48-72, stop once at goal feeds  -- GI to follow and manage wean from enteral nutrition at discharge     # Elevated transaminases  Improving: Labs with AST 21, ALT 70, downtrending from prior.. No evidence of cholestasis or synthetic dysfunction. Presume drug-related though not sure from what culprit  -- can stop following closely as trend is well established     # Pouchitis (dx 03/2023), resolving  No plan for repeat endoscopy or colonoscopy during this admission, per GI, given recent exam. No significant decrement in Hb so far  -- cefdinir 300 mg bid to complete 4 weeks (end = 04/07/2023) then daily for 4 weeks (end = 05/05/2023)  -- CBC weekly     # Desmoid tumors  -- okay to hold nirogesat while having difficulty with oral meds, no urgency to resume per Dr [name]     # Mood / anxiety  -- continue escitalopram (has been taking),  - Discontinued mirtazipine (as pt has been declining)-1/29  -- lorazepam 1 mg bid prn (home medication)     Daily checklist:  VTE prophylaxis: low risk, PADUA score < 4; encourage ambulation  Diet: Nutrition Therapy Regular/House  Osmolite 1.5 (Standard 1.5 Cal) continuous tube feed  Supplement Pediatric; Pediasure (PED Standard); # of Products PER Serving: 1 2xd PC  Code status: Full Code  HCDM:   HCDM ([place] policy, no known patient preference): Letterman,[name] - Mother - [phone]    HCDM, back-up (If primary HCDM is unavailable): Freehling,Jeff - Father - [phone]     Disposition: Floor status. She is interested in AIR again at discharge; PT/OT notes support, consult placed for candidacy. Primary discharge goal is tube feeding at goal rate. Further medication changes would depend on discharge disposition and what could be accommodated at AIR if that is her destination.  -insurance authorization for AIR completed. But no bed availability.   -Likely discharge during weekend     PT/OT recommendations: Skilled PT services indicated, 5x weekly, High intensity (04/02/23) / 5x weekly, High intensity (04/02/23)  DME needs: None (04/02/23) / Defer to post acute (04/02/23)     I personally spent 45 minutes face-to-face and non-face-to-face in the care of this patient, which includes all pre, intra, and post visit time on the date of service.  All documented time was specific to the E/M visit and does not include any procedures that may have been performed      ___________________________________________________________________    Subjective:  No acute events overnight, Patient states pain well controlled, Denies CP or SOB    Recent Results (from the past 24 hours)   Comprehensive Metabolic Panel    Collection Time: 04/10/23  9:31 AM   Result Value Ref Range    Sodium 142 135 - 145 mmol/L    Potassium 3.9 3.4 - 4.8 mmol/L    Chloride 99 98 - 107 mmol/L    CO2 32.0 (H) 20.0 - 31.0 mmol/L    Anion Gap 11 5 - 14 mmol/L    BUN 14 9 - 23 mg/dL    Creatinine 0.10 (L) 0.55 - 1.02 mg/dL    BUN/Creatinine Ratio 27  eGFR CKD-EPI (2021) Female >90 >=60 mL/min/1.60m2    Glucose 96 70 - 179 mg/dL    Calcium 9.8 8.7 - 16.1 mg/dL    Albumin 3.7 3.4 - 5.0 g/dL    Total Protein 7.0 5.7 - 8.2 g/dL    Total Bilirubin 0.2 (L) 0.3 - 1.2 mg/dL    AST 20 <=09 U/L    ALT 34 10 - 49 U/L    Alkaline Phosphatase 120 (H) 46 - 116 U/L   Magnesium Level    Collection Time: 04/10/23  9:31 AM   Result Value Ref Range    Magnesium 2.2 1.6 - 2.6 mg/dL   Phosphorus Level    Collection Time: 04/10/23  9:31 AM   Result Value Ref Range    Phosphorus 4.9 2.4 - 5.1 mg/dL     Labs/Studies:  Labs and Studies from the last 24hrs per EMR and Reviewed    Objective:  Temp:  [36.5 ??C (97.7 ??F)-36.9 ??C (98.4 ??F)] 36.8 ??C (98.2 ??F)  Heart Rate:  [80-90] 80  Resp:  [16-18] 18  BP: (111-140)/(67-84) 117/77  SpO2:  [97 %-98 %] 98 %    General: sitting in bed,  alert, conversant, cooperative, and in no distress.  Eyes: No scleral icterus. Clear conjunctivae. EOMi.  ENT: Normal appearing nose.small bore ND tube left nostril,  Oropharyngeal mucus membranes moist & pink.  Neck: No lymphadenopathy.  Cardiovascular: Regular rate and rhythm. No murmur.  Left arm PICC line site, swelling improving, mild redness/ rash from tape/ dressing id improving from prior. still has bruises but clearing. Extremities: Warm to touch. Regular 2+ radial pulses bilaterally. No LE edema.  Respiratory: Normal respiratory effort on room air Clear to auscultation bilaterally.  Gastrointestinal: Soft, non-tender, non-distended with normoactive bowel sounds.  Neurologic: Alert and fully oriented. Normal speech and language. CAM negative. Moving all extremities purposefully. Normal gait.  Skin: Skin is warm, dry and intact. Rash from tape at picc line site/ face  Psychiatric:  Mood and affect are normal. Speech and behavior are normal.

## 2023-04-10 NOTE — Unmapped (Addendum)
[facility])   Consultation - Liaison (CL) Psychiatry  CL Psychology Follow-Up Note      Service Date:  04/10/23  Admit Date:  03/25/23  Clinician:  Arlana Pouch, PsyD  Intervention: 45 min Individual Psychotherapy  Face-to-Face Clinical Contact with Patient: 50 min  Method of Interaction: In-Person    BACKGROUND INFORMATION AND REASON FOR REFERRAL:   Please see H&P and other recent records for full details. 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 The patient was admitted to?[facility] Hospitalist service for management?of the concerns above. [facility] Psychology services were requested for evaluation and psychotherapy to address anxiety.    Upon initial evaluation, the patient endorsed recurrent episodes depressive symptoms, including depressed mood, hopelessness, and low self-worth that began when she was approximately 23yo in the context of health-related and interpersonal stressors that is consistent with major depressive disorder. She also endorsed lifelong anxiety about numerous domains that is consistent with generalized anxiety disorder. Additionally, Pt endorsed history of significant medical trauma and associated recurrent nightmares, flashbacks, dissociative episodes, hypervigilance,and avoidance of trauma-related reminders that is concerning for PTSD. Pt is amenable to working with Psychology on techniques to minimize anxiety that may be interfering with care progression during admission.     The patient was seen today for follow-up psychotherapy session to address anxiety.    Interval events:  -No acute/adverse psychological events since last seen by Psychology    ASSESSMENT  IMPRESSIONS/SUMMARY    At follow-up, Pt's mood appeared overall stable. Session topics included 5-4-3-2-1 grounding technique to address posttrauma symptoms. Pt actively engaged and receptive to feedback.     DIAGNOSTIC IMPRESSIONS    Major depressive disorder, recurrent episode, moderate  Generalized anxiety disorder  Posttraumatic stress disorder    Risk Assessment:  ASQ screening result: not completed    -A full risk assessment was previously performed on 03/31/23.  Risk assessment remains essentially unchanged.    Current suicide risk: low risk  Current homicide risk: low risk      PLAN  RECOMMENDATIONS    ## Safety and Observation Level:   -This patient is not currently under IVC. If safety concerns arise, please page psychiatry for an evaluation. Recommend routine level of observation per primary team.    ## Follow-up:  - The patient desires ongoing follow-up from CL Psychology while medically inpatient.  - The patient is being followed by Psychiatry.    ## Disposition:  -There are no psychological contraindications to discharging this patient when medically appropriate.   - When this patient is discharged, please ensure that their AVS includes information about the 55 Suicide & Crisis Lifeline.      SUBJECTIVE  SESSION CONTENT     Session began with a brief check-in. Pt denied significant changes in mood. She shared she is hopeful that she will be able to transfer to [facility] soon. Pt provided space to discuss traumatic event from the past in the setting of recent trigger. Pt indicated she continues to experience intrusion and dissociative symptoms related to past medical trauma. Pt provided psychoeducation on 5-4-3-2-1 grounding technique. Pt practiced technique in session and processed experience, noting that she found it to be helpful for focusing her mind on the present moment.     OBJECTIVE   MENTAL STATUS     Orientation and consciousness: AOx4  Appearance: Appears stated age  Behavior: Calm, Cooperative, Direct eye contact, and Polite  Speech: Within normal limits  Language: Intact  Mood:  Neutral  Affect: Full and appropriate to discussion content  Perceptual disturbance (hallucinations, illusions): None  Thought process and association: Linear and coherent  Thought content (delusions, obsessions etc.): None  Insight: Intact  Judgement: Intact  Memory: WNL, although not formally assessed     EDUCATION/INTERVENTIONS:    -Completed mood check-in and reviewed the patient's current stressors and strategies to manage stress.  -Cognitive behavioral therapy  -Trauma-informed interventions  -Discussed the patient's treatment goals, revisited discussion of treatment options, and  explored goals of care.  -Continued to discuss pertinent findings with Pt's care team

## 2023-04-11 ENCOUNTER — Ambulatory Visit
Admission: TF | Admit: 2023-04-11 | Payer: BLUE CROSS/BLUE SHIELD | Source: Intra-hospital | Admitting: Spinal Cord Injury Medicine

## 2023-04-11 ENCOUNTER — Ambulatory Visit
Admission: TF | Admit: 2023-04-11 | Discharge: 2023-05-02 | Payer: BLUE CROSS/BLUE SHIELD | Source: Intra-hospital | Admitting: Spinal Cord Injury Medicine

## 2023-04-11 ENCOUNTER — Inpatient Hospital Stay
Admission: TF | Admit: 2023-04-11 | Discharge: 2023-05-02 | Disposition: A | Discharge status: Home / Self Care | Payer: BLUE CROSS/BLUE SHIELD | Source: Intra-hospital | Admitting: Spinal Cord Injury Medicine

## 2023-04-11 MED ORDER — DIPHENHYDRAMINE 12.5 MG/5 ML ORAL LIQUID
Freq: Two times a day (BID) | ORAL | 0 refills | 12.00 days | Status: CP | PRN
Start: 2023-04-11 — End: 2023-04-11

## 2023-04-11 MED ORDER — GUAR GUM ORAL PACKET
PACK | Freq: Three times a day (TID) | ENTERAL | 0 refills | 30.00 days | Status: CP
Start: 2023-04-11 — End: 2023-04-11

## 2023-04-11 MED ORDER — AMMONIUM LACTATE 12 % LOTION
Freq: Two times a day (BID) | TOPICAL | 0 refills | 200.00 days | Status: CP
Start: 2023-04-11 — End: 2023-04-11

## 2023-04-11 MED ADMIN — pantoprazole (Protonix) injection 40 mg: 40 mg | INTRAVENOUS | @ 14:00:00 | Stop: 2023-04-11

## 2023-04-11 MED ADMIN — promethazine (PHENERGAN) 12.5 mg in sodium chloride (NS) 0.9 % 50 mL IVPB: 12.5 mg | INTRAVENOUS | @ 03:00:00

## 2023-04-11 MED ADMIN — enoxaparin (LOVENOX) syringe 50 mg: 1 mg/kg | SUBCUTANEOUS | @ 03:00:00

## 2023-04-11 MED ADMIN — cetirizine (ZYRTEC) oral syrup: 20 mg | ENTERAL | @ 14:00:00 | Stop: 2023-04-11

## 2023-04-11 MED ADMIN — famotidine (PEPCID) oral suspension: 20 mg | ENTERAL | @ 02:00:00

## 2023-04-11 MED ADMIN — ondansetron (ZOFRAN) injection 8 mg: 8 mg | INTRAVENOUS | @ 10:00:00 | Stop: 2023-04-11

## 2023-04-11 MED ADMIN — ondansetron (ZOFRAN) injection 8 mg: 8 mg | INTRAVENOUS | @ 03:00:00

## 2023-04-11 MED ADMIN — ondansetron (ZOFRAN) injection 8 mg: 8 mg | INTRAVENOUS | @ 19:00:00 | Stop: 2023-04-11

## 2023-04-11 MED ADMIN — oxyCODONE (ROXICODONE) 5 mg/5 mL solution 20 mg: 20 mg | ORAL | @ 14:00:00 | Stop: 2023-04-11

## 2023-04-11 MED ADMIN — sodium chloride (NS) 0.9 % flush 10 mL: 10 mL | INTRAVENOUS | @ 11:00:00 | Stop: 2023-04-11

## 2023-04-11 MED ADMIN — ammonium lactate (LAC-HYDRIN) 12 % lotion 1 Application: 1 | TOPICAL | @ 02:00:00

## 2023-04-11 MED ADMIN — escitalopram (LEXAPRO) oral solution: 5 mg | ENTERAL | @ 14:00:00 | Stop: 2023-04-11

## 2023-04-11 MED ADMIN — HYDROmorphone (PF) (DILAUDID) injection 1 mg: 1 mg | INTRAVENOUS | @ 08:00:00 | Stop: 2023-04-11

## 2023-04-11 MED ADMIN — HYDROmorphone (PF) (DILAUDID) injection 1 mg: 1 mg | INTRAVENOUS | @ 01:00:00 | Stop: 2023-04-17

## 2023-04-11 MED ADMIN — ammonium lactate (LAC-HYDRIN) 12 % lotion 1 Application: 1 | TOPICAL | @ 15:00:00 | Stop: 2023-04-11

## 2023-04-11 MED ADMIN — promethazine (PHENERGAN) 12.5 mg in sodium chloride (NS) 0.9 % 50 mL IVPB: 12.5 mg | INTRAVENOUS | @ 10:00:00 | Stop: 2023-04-11

## 2023-04-11 MED ADMIN — promethazine (PHENERGAN) 12.5 mg in sodium chloride (NS) 0.9 % 50 mL IVPB: 12.5 mg | INTRAVENOUS | @ 21:00:00 | Stop: 2023-04-11

## 2023-04-11 MED ADMIN — acetaminophen (TYLENOL) oral liquid: 1000 mg | ENTERAL | @ 10:00:00 | Stop: 2023-04-11

## 2023-04-11 MED ADMIN — pantoprazole (Protonix) injection 40 mg: 40 mg | INTRAVENOUS | @ 02:00:00

## 2023-04-11 MED ADMIN — promethazine (PHENERGAN) 12.5 mg in sodium chloride (NS) 0.9 % 50 mL IVPB: 12.5 mg | INTRAVENOUS | @ 14:00:00 | Stop: 2023-04-11

## 2023-04-11 MED ADMIN — HYDROmorphone (PF) (DILAUDID) injection 1 mg: 1 mg | INTRAVENOUS | @ 16:00:00 | Stop: 2023-04-11

## 2023-04-11 MED ADMIN — sodium chloride (NS) 0.9 % flush 10 mL: 10 mL | INTRAVENOUS | @ 19:00:00 | Stop: 2023-04-11

## 2023-04-11 MED ADMIN — guar gum (NUTRISOURCE) 1 packet: 1 | ENTERAL | @ 02:00:00

## 2023-04-11 MED ADMIN — enoxaparin (LOVENOX) syringe 50 mg: 1 mg/kg | SUBCUTANEOUS | @ 14:00:00 | Stop: 2023-04-11

## 2023-04-11 MED ADMIN — oxyCODONE (ROXICODONE) 5 mg/5 mL solution 20 mg: 20 mg | ORAL | @ 03:00:00 | Stop: 2023-04-20

## 2023-04-11 MED ADMIN — guar gum (NUTRISOURCE) 1 packet: 1 | ENTERAL | @ 15:00:00 | Stop: 2023-04-11

## 2023-04-11 MED ADMIN — acetaminophen (TYLENOL) oral liquid: 1000 mg | ENTERAL | @ 03:00:00

## 2023-04-11 MED ADMIN — guar gum (NUTRISOURCE) 1 packet: 1 | ENTERAL | @ 20:00:00 | Stop: 2023-04-11

## 2023-04-11 MED ADMIN — cefdinir (OMNICEF) oral suspension: 300 mg | ORAL | @ 14:00:00 | Stop: 2023-04-11

## 2023-04-11 MED ADMIN — acetaminophen (TYLENOL) oral liquid: 1000 mg | ENTERAL | @ 19:00:00 | Stop: 2023-04-11

## 2023-04-11 MED ADMIN — oxyCODONE (ROXICODONE) 5 mg/5 mL solution 20 mg: 20 mg | ORAL | @ 10:00:00 | Stop: 2023-04-11

## 2023-04-11 MED ADMIN — lidocaine (ASPERCREME) 4 % 1 patch: 1 | TRANSDERMAL | @ 14:00:00 | Stop: 2023-04-11

## 2023-04-11 MED ADMIN — oxyCODONE (ROXICODONE) 5 mg/5 mL solution 20 mg: 20 mg | ORAL | @ 19:00:00 | Stop: 2023-04-11

## 2023-04-11 MED ADMIN — HYDROmorphone (PF) (DILAUDID) injection 1 mg: 1 mg | INTRAVENOUS | @ 23:00:00 | Stop: 2023-04-17

## 2023-04-11 MED ADMIN — sodium chloride (NS) 0.9 % flush 10 mL: 10 mL | INTRAVENOUS | @ 03:00:00

## 2023-04-11 NOTE — Unmapped (Signed)
Enoxaparin Therapeutic Monitoring Pharmacy Note    [name] [name] is a 23 y.o. female starting enoxaparin.    Indication:  DVT left arm    Prior Dosing Information: None/new initiation    Goals:  Therapeutic Drug Levels  Not applicable    Additional Clinical Monitoring/Outcomes  Monitor hemoglobin and platelets  Monitor for signs/symptoms of bleeding  Monitor renal function     Results:  Not applicable  Wt Readings from Last 3 Encounters:   04/11/23 55.3 kg (121 lb 14.6 oz)   04/06/23 54.4 kg (120 lb)   03/24/23 52.8 kg (116 lb 6.4 oz)     HGB   Date Value Ref Range Status   04/06/2023 11.0 (L) 11.3 - 14.9 g/dL Final     Platelet   Date Value Ref Range Status   04/06/2023 189 150 - 450 10*9/L Final     Creatinine   Date Value Ref Range Status   04/10/2023 0.51 (L) 0.55 - 1.02 mg/dL Final       Pharmacokinetic Considerations and Significant Drug Interactions:   Concurrent antiplatelet medications: not applicable    Assessment/Plan:   Recommendation(s)  Start LMWH 50 mg q12h    Follow-up  Level due: No levels indicated at this time  A pharmacist will continue to monitor and recommend levels as appropriate      Please page service pharmacist with questions/clarifications.    Berlin Hun, PharmD,  MPH

## 2023-04-11 NOTE — Unmapped (Signed)
Problem: Adult Inpatient Plan of Care  Goal: Plan of Care Review  Outcome: Resolved  Flowsheets (Taken 04/11/2023 1608)  Progress: improving  Plan of Care Reviewed With: patient  Goal: Patient-Specific Goal (Individualized)  Outcome: Resolved  Goal: Absence of Hospital-Acquired Illness or Injury  Outcome: Resolved  Goal: Optimal Comfort and Wellbeing  Outcome: Resolved  Goal: Readiness for Transition of Care  Outcome: Resolved  Goal: Rounds/Family Conference  Outcome: Resolved     Problem: Adult Inpatient Plan of Care  Goal: Plan of Care Review  Outcome: Resolved  Flowsheets (Taken 04/11/2023 1608)  Progress: improving  Plan of Care Reviewed With: patient  Goal: Patient-Specific Goal (Individualized)  Outcome: Resolved  Goal: Absence of Hospital-Acquired Illness or Injury  Outcome: Resolved  Goal: Optimal Comfort and Wellbeing  Outcome: Resolved  Goal: Readiness for Transition of Care  Outcome: Resolved  Goal: Rounds/Family Conference  Outcome: Resolved     Problem: Adult Inpatient Plan of Care  Goal: Plan of Care Review  Outcome: Resolved  Flowsheets (Taken 04/11/2023 1608)  Progress: improving  Plan of Care Reviewed With: patient  Goal: Patient-Specific Goal (Individualized)  Outcome: Resolved  Goal: Absence of Hospital-Acquired Illness or Injury  Outcome: Resolved  Goal: Optimal Comfort and Wellbeing  Outcome: Resolved  Goal: Readiness for Transition of Care  Outcome: Resolved  Goal: Rounds/Family Conference  Outcome: Resolved     Problem: Infection  Goal: Absence of Infection Signs and Symptoms  Outcome: Resolved     Problem: Latex Allergy  Goal: Absence of Allergy Symptoms  Outcome: Resolved     Problem: Oncology Care  Goal: Effective Coping  Outcome: Resolved  Goal: Improved Activity Tolerance  Outcome: Resolved  Goal: Optimal Oral Intake  Outcome: Resolved  Goal: Improved Oral Mucous Membrane Integrity  Outcome: Resolved  Goal: Optimal Pain Control and Function  Outcome: Resolved     Problem: Self-Care Deficit  Goal: Improved Ability to Complete Activities of Daily Living  Outcome: Resolved     Problem: Fall Injury Risk  Goal: Absence of Fall and Fall-Related Injury  Outcome: Resolved     Problem: Skin Injury Risk Increased  Goal: Skin Health and Integrity  Outcome: Resolved     Problem: Pain Acute  Goal: Optimal Pain Control and Function  Outcome: Resolved

## 2023-04-11 NOTE — Unmapped (Addendum)
Facility Information: [facility]   Facility ID: [id number]  Facility Medicare Provider Number: [phone]      Physical Medicine and Rehabilitation  Rehab Pre-Admit Screening  Date: 04/11/2023   Time: 8:47 AM     Patient Information:    Patient Name:  [name] [name] Medical Record Number: [id number]   Address:  [address] [place] [address] Sex: Female   Date of Birth: 05-19-2000 Age: 23 y.o.   Room/Bed:  7322/7322-01    _____________________________________________________________________________    Attending Physician's Review and Admission Determination     Medical Necessity:  The patient requires acute inpatient rehabilitation to maximize functional independence and requires daily physician visits for monitoring/management of vital signs, medications, skin, wounds, and pain control. This patient's rehabilitation goals and medical complexity could not adequately be managed in a less intensive setting.  Potential risks for clinical complications include falls, adverse medication reactions, DVT/PE, pressure ulcers, aspiration, urinary tract infection, dehydration/malnutrition, and worsening of underlying disease.    Medical Prognosis: Good for continued progress and participation with therapy.    Anticipated Interdisciplinary Rehabilitation Interventions: Physical therapy to work on mobility. Occupational therapy to work on self care. Recreational therapy for community reintegration and relaxation training. Neuropsych. Rehab Nursing to work on medication administration, patient/family education, skin care, fall prevention, vital signs, and feeding/nutrition. Weekly interdisciplinary team conference to assess progress and plan of care changes.    Expected Functional Outcomes:  Expected level of improvement: Goals for inpatient rehabilitation are modified independence with ambulation, stair negotiation, and ADLs.    Aleen Campi, MD 04/11/2023 11:02 AM          _____________________________________________________________________________    Advanced Directives:            Coverage Information:  Authorization number:    Authorization Code:  Activation code not generated  Current My [facility] Chart Status: Active  Payor: BCBS / Plan: BCBS BLUE OPTIONS/PPO/ADV ([place] ONLY) / Product Type: *No Product type* /     Physician/Referral Information:  Referring Facility: [facility]    Referring Case Manager: Marylu Lund Ward      Patient / Family / Caregiver Orientation: Pt and Family in agreement with [facility].      Prior Living Situation:  Living Environment: House    Lives With: Father; Mother    Home Living: Multi-level home; Bed/bath upstairs; 1/2 bath on main level; Stairs to alternate level with rails; Stairs to enter without rails; Stairs to alternate level without rails; Handicapped height toilet; Tub/shower unit; Able to Live on main level with bedroom/bathroom       Rail placement (inside): Rail on right side    Number of Stairs to Alternate level (inside): 14        Prior Level of Function:  Prior Function Comments: Reports most recently fluctuating performance with ADLs, IADLs, and functional mobility skills. She is using the rollator in the home and w/c for community ambulation. She reports increased weakness since return from inpatient rehab related to lapse in therapy. Her mother is able to provide support as needed. She works from home. States 2 falls in the past month. States increased fatigue, falls, and passing out recently. Enjoys reading, she is also taking college classes.        Rehabilitation Diagnosis:  Etiologic Diagnosis / Description: [name] [name] is a 23 y/o female with PMHx of [name] s/p proctocolectomy and J-pouch, desmoid tumours, chronic abdominal pain and nausea, anxiety, PTSD related to past medical care,  DVT RUE s/p Eliquis x3 months. [name] [name] was admitted to [facility] on 04/09/2023 for constellation of worsening pain, intolerance of oral intake. [name] [name] has participated in acute inpatient physical and occupational therapies to improve functional mobility, activity tolerance, functional strength, balance, and endurance in order to facilitate safe performance of ADLs and daily routines. He is stable and awaiting rehab placement. [name] [name] has been referred to [facility] for continued acute medical management, provision of intensive inpatient therapies, and patient/family training to facilitate safe performance of ADLs and mobility, prior to discharge home.      Impairment Group: (Debility) 16 Debility (Non-Cardiac/Non-Pulmonary)      Date of Onset: 03/25/23         Patient Active Problem List    Diagnosis Date Noted    Elevated liver transaminase level 04/01/2023    Positive blood culture 04/01/2023    Pouchitis (CMS-HCC) 03/20/2023    Dizziness 03/09/2023    Fatigue 03/09/2023    Iron deficiency 02/25/2023    Insomnia 02/13/2023    Pain medication agreement signed 12/23/2022    Other acute postprocedural pain 09/25/2022    High risk medication use 09/17/2022    Urinary retention 08/07/2022    Anemia 06/13/2022    On total parenteral nutrition (TPN) 06/13/2022    Hypersensitivity reaction 04/08/2022    Small intestinal bacterial overgrowth (SIBO) 03/14/2022    MDD (major depressive disorder) 03/13/2022    Generalized anxiety disorder with panic attacks 12/09/2021    Intractable abdominal pain 12/05/2021    Lower abdominal pain 08/27/2021    Winged scapula of right side 07/16/2021    Acute deep vein thrombosis (DVT) of axillary vein of right upper extremity (CMS-HCC) 03/20/2021    Severe protein-calorie malnutrition (CMS-HCC) 03/20/2021    Physical deconditioning 03/15/2021    History of colectomy 03/15/2021    Hyperglycemia 02/28/2021    Hypoglycemia 02/28/2021    SBO (small bowel obstruction) (CMS-HCC) 02/16/2021    Dehydration 10/14/2020    Iron deficiency anemia due to chronic blood loss 11/10/2019    Cancer associated pain 08/04/2019    Syncope 05/14/2019    Chemotherapy-induced nausea 05/14/2019    Thyroid cyst 10/05/2018    [name] 08/25/2012    Intestinal polyps 08/25/2012    [name] 08/25/2012    [name] of skin 08/25/2012    History of colonic polyps 01/09/2012    Other benign neoplasm of connective and other soft tissue of upper limb, including shoulder 02/26/2011    Benign neoplasm of skin of trunk 01/17/2004       Medical / Functional Conditions Requiring Inpatient Rehab: Patient requires monitoring of labwork, electrolytes, blood pressure and monitoring/management of medical diagnosis. Medical management/administration of opioid analgesics, antibiotics, antidepressants, antipsychotics, antiemetics, anticoagulants and other prescribed/necessary medications.  Monitoring/management of pain, cardiopulmonary status, renal status, gastrointestinal status, neurological status, infection, bleeding, bowel and bladder, DVT, and skin breakdown.      Risk for Medical / Clinical Complication: Patient at increased risk for complications of cardiopulmonary insufficiency, renal insufficiency, hypertensive crisis, gastrointestinal insufficiency, neurological decline, seizures, fluid volume overload, aspiration, bleeding, infection, uncontrolled pain, bowel/bladder retention, falls, and skin breakdown.      Special Rehabilitation Needs:  IV Lines / Tubes / Drains: PIV, NGT          Hearing - Right Ear: Functional    Hearing - Left Ear: Functional       Cultural Requests During Hospitalization: Pt at increased risk of anxiety/depression related to recent  medical status change    Safety Equipment at Bedside: AMBU Bag; Suction    Nutrition Type: General; Tube feeding    Tube Feeding: Nutren    Tube Feeding Frequency: Intermittent    Weight Bearing Status: Non-applicable    Requires modified schedule: Pt may require rest breaks between therapy sessions      Precautions: Falls precautions       Required Braces or Orthoses: Non-applicable    Was the influenza vaccine received in this facility during this year's vaccination season?  Not documented       Past Medical History:  Past Medical History:   Diagnosis Date    Abdominal pain     Acid reflux     occas    Anesthesia complication     itching, shaking, coldness; last few surgeries have gone much better    Cancer (CMS-HCC)     Cataract of right eye     COVID-19 virus infection 01/2019    Cyst of thyroid determined by ultrasound     monitoring    [name]     2 right forearm, 1 left thigh, 1 right scapula, 1 under left clavicle; multiple    Difficult intravenous access     [name] (familial adenomatous polyposis)     [name]     Gastric polyps     History of chemotherapy     last treatment approx 05/2019    History of colon polyps     History of COVID-19 01/2019    Ileus (CMS-HCC) 03/16/2022    Iron deficiency anemia due to chronic blood loss     received iron infusion 11-2019    PONV (postoperative nausea and vomiting)     Rectal bleeding     Syncopal episodes     especially if becoming dehydrated     Past Surgical History:  Past Surgical History:   Procedure Laterality Date    COLON SURGERY      cyroablation      cystis removal      desmoid removal      PR CLOSE ENTEROSTOMY,RESEC+ANAST N/A 10/09/2020    Procedure: ILEOSTOMY TAKEDOWN;  Surgeon: Mickle Asper, MD;  Location: OR [place];  Service: General Surgery    PR COLONOSCOPY W/BIOPSY SINGLE/MULTIPLE N/A 10/27/2012    Procedure: COLONOSCOPY, FLEXIBLE, PROXIMAL TO SPLENIC FLEXURE; WITH BIOPSY, SINGLE OR MULTIPLE;  Surgeon: Shirlyn Goltz Mir, MD;  Location: PEDS PROCEDURE ROOM [place];  Service: Gastroenterology    PR COLONOSCOPY W/BIOPSY SINGLE/MULTIPLE N/A 09/14/2013    Procedure: COLONOSCOPY, FLEXIBLE, PROXIMAL TO SPLENIC FLEXURE; WITH BIOPSY, SINGLE OR MULTIPLE;  Surgeon: Shirlyn Goltz Mir, MD;  Location: PEDS PROCEDURE ROOM [place];  Service: Gastroenterology    PR COLONOSCOPY W/BIOPSY SINGLE/MULTIPLE N/A 11/08/2014    Procedure: COLONOSCOPY, FLEXIBLE, PROXIMAL TO SPLENIC FLEXURE; WITH BIOPSY, SINGLE OR MULTIPLE;  Surgeon: Arnold Long Mir, MD;  Location: PEDS PROCEDURE ROOM [facility];  Service: Gastroenterology    PR COLONOSCOPY W/BIOPSY SINGLE/MULTIPLE N/A 12/26/2015    Procedure: COLONOSCOPY, FLEXIBLE, PROXIMAL TO SPLENIC FLEXURE; WITH BIOPSY, SINGLE OR MULTIPLE;  Surgeon: Arnold Long Mir, MD;  Location: PEDS PROCEDURE ROOM [facility];  Service: Gastroenterology    PR COLONOSCOPY W/BIOPSY SINGLE/MULTIPLE N/A 09/02/2017    Procedure: COLONOSCOPY, FLEXIBLE, PROXIMAL TO SPLENIC FLEXURE; WITH BIOPSY, SINGLE OR MULTIPLE;  Surgeon: Arnold Long Mir, MD;  Location: PEDS PROCEDURE ROOM [place];  Service: Gastroenterology    PR COLSC FLX W/REMOVAL LESION BY HOT BX FORCEPS N/A 08/27/2016    Procedure: COLONOSCOPY, FLEXIBLE, PROXIMAL TO  SPLENIC FLEXURE; W/REMOVAL TUMOR/POLYP/OTHER LESION, HOT BX FORCEP/CAUTE;  Surgeon: Arnold Long Mir, MD;  Location: PEDS PROCEDURE ROOM [facility];  Service: Gastroenterology    PR COLSC FLX W/RMVL OF TUMOR POLYP LESION SNARE TQ N/A 02/25/2019    Procedure: COLONOSCOPY FLEX; W/REMOV TUMOR/LES BY SNARE;  Surgeon: Helyn Numbers, MD;  Location: [facility] PROC[facility]URES [place] [facility];  Service: Gastroenterology    PR COLSC FLX W/RMVL OF TUMOR POLYP LESION SNARE TQ N/A 03/13/2020    Procedure: COLONOSCOPY FLEX; W/REMOV TUMOR/LES BY SNARE;  Surgeon: Helyn Numbers, MD;  Location: [facility] [place] [facility];  Service: Gastroenterology    PR EXC SKIN BENIG 2.1-3 CM TRUNK,ARM,LEG Right 02/25/2017    Procedure: EXCISION, BENIGN LESION INCLUDE MARGINS, EXCEPT SKIN TAG, LEGS; EXCISED DIAMETER 2.1 TO 3.0 CM;  Surgeon: Clarene Duke, MD;  Location: CHILDRENS OR [facility];  Service: Plastics    PR EXC SKIN BENIG 3.1-4 CM TRUNK,ARM,LEG Right 02/25/2017    Procedure: EXCISION, BENIGN LESION INCLUDE MARGINS, EXCEPT SKIN TAG, ARMS; EXCISED DIAMETER 3.1 TO 4.0 CM;  Surgeon: Clarene Duke, MD;  Location: CHILDRENS OR [facility];  Service: Plastics PR EXC SKIN BENIG >4 CM FACE,FACIAL Right 02/25/2017    Procedure: EXCISION, OTHER BENIGN LES INCLUD MARGIN, FACE/EARS/EYELIDS/NOSE/LIPS/MUCOUS MEMBRANE; EXCISED DIAM >4.0 CM;  Surgeon: Clarene Duke, MD;  Location: CHILDRENS OR [facility];  Service: Plastics    PR EXC TUMOR SOFT TISSUE LEG/ANKLE SUBQ 3+CM Right 08/05/2019    Procedure: EXCISION, TUMOR, SOFT TISSUE OF LEG OR ANKLE AREA, SUBCUTANEOUS; 3 CM OR GREATER;  Surgeon: Arsenio Katz, MD;  Location: MAIN OR [place];  Service: Plastics    PR EXC TUMOR SOFT TISSUE LEG/ANKLE SUBQ <3CM Right 08/05/2019    Procedure: EXCISION, TUMOR, SOFT TISSUE OF LEG OR ANKLE AREA, SUBCUTANEOUS; LESS THAN 3 CM;  Surgeon: Arsenio Katz, MD;  Location: MAIN OR [facility];  Service: Plastics    PR LAP, SURG PROCTECTOMY W J-POUCH N/A 08/10/2020    Procedure: ROBOTIC ASSISTED LAPAROSCOPIC PROCTOCOLECTOMY, ILEAL J POUCH, WITH OSTOMY;  Surgeon: Mickle Asper, MD;  Location: OR [place];  Service: General Surgery    PR NDSC EVAL INTSTINAL POUCH DX W/COLLJ SPEC SPX N/A 01/23/2021    Procedure: ENDO EVAL SM INTEST POUCH; DX;  Surgeon: Modena Nunnery, MD;  Location: [facility] [place] [facility];  Service: Gastroenterology    PR NDSC EVAL INTSTINAL POUCH DX W/COLLJ SPEC SPX N/A 08/27/2021    Procedure: ENDO EVAL SM INTEST POUCH; DX;  Surgeon: Hunt Oris, MD;  [facility]: [facility]URES [facility];  Service: Gastroenterology    PR NDSC EVAL INTSTINAL POUCH DX W/COLLJ SPEC SPX N/A 12/09/2021    Procedure: ENDO EVAL SM INTEST POUCH; DX;  Surgeon: Vidal Schwalbe, MD;  Location: GI PROCEDURES [facility];  Service: Gastroenterology    PR NDSC EVAL INTSTINAL POUCH DX W/COLLJ [facility] SPX Left 04/09/2022    Procedure: ENDO EVAL SM INTEST POUCH; DX;  Surgeon: Modena Nunnery, MD;  Location: [facility] [place] [facility];  Service: Gastroenterology    PR NDSC EVAL INTSTINAL POUCH DX W/COLLJ SPEC SPX N/A 08/05/2022    Procedure: ENDO EVAL SM INTEST POUCH; DX; Surgeon: Modena Nunnery, MD;  Location: GI PROCEDURES [facility];  Service: Gastroenterology    PR NDSC EVAL INTSTINAL POUCH DX W/COLLJ SPEC SPX N/A 03/13/2023    Procedure: ENDO EVAL SM INTEST POUCH; DX;  Surgeon: Carmon Ginsberg, MD;  [facility]: GI PROC[facility]URES [facility];  Service: Gastroenterology    PR NDSC EVAL INTSTINAL  POUCH W/BX SINGLE/MULTIPLE N/A 01/20/2022    Procedure: ENDOSCOPIC EVAL OF SMALL INTESTINAL POUCH; DIAGNOSTIC, No biopsies;  Surgeon: Andrey Farmer, MD;  Location: GI PROCEDURES [facility];  Service: Gastroenterology    PR NDSC EVAL INTSTINAL POUCH W/BX SINGLE/MULTIPLE N/A 02/13/2022    Procedure: ENDOSCOPIC EVAL OF SMALL INTESTINAL POUCH; DIAGNOSTIC, WITH BIOPSY;  Surgeon: Bronson Curb, MD;  Location: GI PROCEDURES [facility];  Service: Gastroenterology    PR NDSC EVAL INTSTINAL POUCH W/BX SINGLE/MULTIPLE N/A 03/13/2023    Procedure: ENDOSCOPIC EVAL OF SMALL INTESTINAL POUCH; DIAGNOSTIC, WITH BIOPSY;  Surgeon: Carmon Ginsberg, MD;  Location: GI PROCEDURES [facility];  Service: Gastroenterology    PR UNLISTED PROCEDURE SMALL INTESTINE  01/23/2021    Procedure: UNLISTED PROCEDURE, SMALL INTESTINE;  Surgeon: Modena Nunnery, MD;  Location: [facility] [place] [facility];  Service: Gastroenterology    PR UNLISTED PROCEDURE SMALL INTESTINE  02/13/2022    Procedure: UNLISTED PROCEDURE, SMALL INTESTINE;  Surgeon: Bronson Curb, MD;  Location: GI PROCEDURES [facility];  Service: Gastroenterology    PR UPPER GI ENDOSCOPY,BIOPSY N/A 10/27/2012    Procedure: UGI ENDOSCOPY; WITH BIOPSY, SINGLE OR MULTIPLE;  Surgeon: Shirlyn Goltz Mir, MD;  Location: PEDS PROCEDURE ROOM [facility];  Service: Gastroenterology    PR UPPER GI ENDOSCOPY,BIOPSY N/A 09/14/2013    Procedure: UGI ENDOSCOPY; WITH BIOPSY, SINGLE OR MULTIPLE;  Surgeon: Shirlyn Goltz Mir, MD;  Location: PEDS PROCEDURE ROOM [facility];  Service: Gastroenterology    PR UPPER GI ENDOSCOPY,BIOPSY N/A 11/08/2014 Procedure: UGI ENDOSCOPY; WITH BIOPSY, SINGLE OR MULTIPLE;  Surgeon: Arnold Long Mir, MD;  Location: PEDS PROCEDURE ROOM [facility];  Service: Gastroenterology    PR UPPER GI ENDOSCOPY,BIOPSY N/A 12/26/2015    Procedure: UGI ENDOSCOPY; WITH BIOPSY, SINGLE OR MULTIPLE;  Surgeon: Arnold Long Mir, MD;  Location: PEDS PROCEDURE ROOM [facility];  Service: Gastroenterology    PR UPPER GI ENDOSCOPY,BIOPSY N/A 08/27/2016    Procedure: UGI ENDOSCOPY; WITH BIOPSY, SINGLE OR MULTIPLE;  Surgeon: Arnold Long Mir, MD;  Location: PEDS PROCEDURE ROOM [facility];  Service: Gastroenterology    PR UPPER GI ENDOSCOPY,BIOPSY N/A 09/02/2017    Procedure: UGI ENDOSCOPY; WITH BIOPSY, SINGLE OR MULTIPLE;  Surgeon: Arnold Long Mir, MD;  Location: PEDS PROCEDURE ROOM [facility];  Service: Gastroenterology    PR UPPER GI ENDOSCOPY,BIOPSY N/A 03/13/2020    Procedure: UGI ENDOSCOPY; WITH BIOPSY, SINGLE OR MULTIPLE;  Surgeon: Helyn Numbers, MD;  Location: [facility] [place] [facility];  Service: Gastroenterology    PR UPPER GI ENDOSCOPY,BIOPSY N/A 09/05/2021    Procedure: UGI ENDOSCOPY; WITH BIOPSY, SINGLE OR MULTIPLE;  Surgeon: Wendall Papa, MD;  Location: GI PROCEDURES [facility];  Service: Gastroenterology    PR UPPER GI ENDOSCOPY,DIAGNOSIS N/A 01/20/2022    Procedure: UGI ENDO, INCLUDE ESOPHAGUS, STOMACH, & DUODENUM &/OR JEJUNUM; DX W/WO COLLECTION SPECIMN, BY BRUSH OR WASH;  Surgeon: Andrey Farmer, MD;  Location: GI PROCEDURES [facility];  Service: Gastroenterology    TUMOR REMOVAL      multiple-head, neck, back, hand, right flank, multiple     Family History:  Family History   Problem Relation Age of Onset    No Known Problems Mother     No Known Problems Father     No Known Problems Sister     No Known Problems Brother     Stroke Maternal Grandmother     Other Maternal Grandmother         benign lesions of liver and pancreas, further details unknown    Cancer Maternal  Grandmother     Diabetes Maternal Grandmother     Hypertension Maternal Grandmother     Thyroid disease Maternal Grandmother     Arthritis Maternal Grandfather     Asthma Maternal Grandfather     COPD Paternal Grandmother         Deceased    Miscarriages / Stillbirths Paternal Grandmother     Alcohol abuse Paternal Grandfather         Deceased    No Known Problems Maternal Aunt     No Known Problems Maternal Uncle     No Known Problems Paternal Aunt     No Known Problems Paternal Uncle     Anesthesia problems Neg Hx     Broken bones Neg Hx     Cancer Neg Hx     Clotting disorder Neg Hx     Collagen disease Neg Hx     Diabetes Neg Hx     Dislocations Neg Hx     Fibromyalgia Neg Hx     Gout Neg Hx     Hemophilia Neg Hx     Osteoporosis Neg Hx     Rheumatologic disease Neg Hx     Scoliosis Neg Hx     Severe sprains Neg Hx     Sickle cell anemia Neg Hx     Spinal Compression Fracture Neg Hx     Melanoma Neg Hx     Basal cell carcinoma Neg Hx     Squamous cell carcinoma Neg Hx      Social History:  Social History     Socioeconomic History    Marital status: Single     Spouse name: None    Number of children: 0    Years of education: 15    Highest education level: Some college, no degree   Tobacco Use    Smoking status: Never     Passive exposure: Past    Smokeless tobacco: Never   Vaping Use    Vaping status: Never Used   Substance and Sexual Activity    Alcohol use: Never    Drug use: Never    Sexual activity: Never   Other Topics Concern    Do you use sunscreen? Yes    Tanning bed use? No    Are you easily burned? No    Excessive sun exposure? No    Blistering sunburns? 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     Social Drivers of Psychologist, prison and probation services Strain: Low Risk  (03/10/2023)    Overall Financial Resource Strain (CARDIA)     Difficulty of Paying Living Expenses: Not very hard   Food Insecurity: No Food Insecurity (03/10/2023)    Hunger Vital Sign     Worried About Running Out of Food in the Last Year: Never true     Ran Out of Food in the Last Year: Never true   Transportation Needs: No Transportation Needs (03/10/2023)    PRAPARE - Therapist, art (Medical): No     Lack of Transportation (Non-Medical): No     Current Facility-Administered Medications Ordered in Epic   Medication Dose Route Frequency Provider Last Rate Last Admin    acetaminophen (TYLENOL) oral liquid  1,000 mg Enteral tube: post-pyloric (duodenum, jejunum) Q8H SCH [name], MD   1,000 mg at  04/11/23 0501    ammonium lactate (LAC-HYDRIN) 12 % lotion 1 Application  1 Application Topical BID [name], MD   1 Application at 04/10/23 2039    buprenorphine 20 mcg/hour transdermal patch 1 patch  1 patch Transdermal Weekly Dholakia, Vibhaben, AGNP   1 patch at 04/08/23 4098    cefdinir (OMNICEF) oral suspension  300 mg Oral Daily Dolores Patty, MD   300 mg at 04/10/23 1227    cetirizine (ZYRTEC) oral syrup  20 mg Enteral tube: post-pyloric (duodenum, jejunum) BID [name], MD   20 mg at 04/09/23 0901    [Provider Hold] cholecalciferol (vitamin D3 25 mcg (1,000 units)) tablet 25 mcg  25 mcg Oral Daily Dholakia, Vibhaben, AGNP        diphenhydrAMINE (BENADRYL) oral elixir  12.5 mg Oral BID PRN Inigo, Stellamary, FNP        enoxaparin (LOVENOX) syringe 50 mg  1 mg/kg Subcutaneous Q12H SCH Dholakia, Vibhaben, AGNP   50 mg at 04/10/23 2206    escitalopram (LEXAPRO) oral solution  5 mg Enteral tube: post-pyloric (duodenum, jejunum) Daily [name], MD   5 mg at 04/10/23 1191    famotidine (PEPCID) oral suspension  20 mg Enteral tube: post-pyloric (duodenum, jejunum) Nightly [name], MD   20 mg at 04/10/23 2038    fluticasone propionate (FLONASE) 50 mcg/actuation nasal spray 1 spray  1 spray Topical Weekly Dholakia, Vibhaben, AGNP   1 spray at 04/08/23 0927    guar gum (NUTRISOURCE) 1 packet  1 packet Enteral tube: post-pyloric (duodenum, jejunum) TID Dolores Patty, MD   1 packet at 04/10/23 2039    hydrocortisone 1 % cream   Topical BID PRN August Albino, MD   Given at 04/09/23 0100    hydrocortisone 2.5 % cream   Topical BID PRN Dholakia, Vibhaben, AGNP        HYDROmorphone (PF) (DILAUDID) injection 1 mg  1 mg Intravenous Q6H PRN Joesph July, FNP   1 mg at 04/11/23 0235    hyoscyamine (LEVSIN) tablet 125 mcg  125 mcg Oral Q4H PRN Dolores Patty, MD        IP OKAY TO TREAT   Other Continuous PRN Dholakia, Vibhaben, AGNP        IP OKAY TO TREAT   Other Continuous PRN Dholakia, Vibhaben, AGNP        IP OKAY TO TREAT   Other Continuous PRN Dholakia, Vibhaben, AGNP        lidocaine (ASPERCREME) 4 % 1 patch  1 patch Transdermal Daily Dholakia, Vibhaben, AGNP   1 patch at 04/10/23 0951    loperamide (IMODIUM) oral solution  2 mg Oral QID PRN Dolores Patty, MD        LORazepam (ATIVAN) tablet 1 mg  1 mg Oral BID PRN Joesph July, FNP   1 mg at 04/09/23 2317    melatonin tablet 3 mg  3 mg Oral Nightly PRN [name], MD        multivitamin with folic acid 400 mcg tablet 1 tablet  1 tablet Oral Daily [name], MD   1 tablet at 04/10/23 0950    naloxegol (MOVANTIK) 12.5 mg tablet 12.5 mg  12.5 mg Oral Daily PRN Dholakia, Vibhaben, AGNP        OLANZapine zydis (ZYPREXA) disintegrating tablet 5 mg  5 mg Oral Q PM Dolores Patty, MD   5 mg at 04/10/23 1738    ondansetron (  ZOFRAN) injection 8 mg  8 mg Intravenous Q8H Dholakia, Vibhaben, AGNP   8 mg at 04/11/23 0501    oxyCODONE (ROXICODONE) 5 mg/5 mL solution 20 mg  20 mg Oral Q4H PRN Monna Fam, MD   20 mg at 04/11/23 0501    pantoprazole (Protonix) injection 40 mg  40 mg Intravenous BID Dolores Patty, MD   40 mg at 04/10/23 2036    promethazine (PHENERGAN) 12.5 mg in sodium chloride (NS) 0.9 % 50 mL IVPB  12.5 mg Intravenous Q6H [name], MD   Stopped at 04/11/23 [phone]    scopolamine (TRANSDERM-SCOP) 1 mg over 3 days topical patch 1 mg  1 patch Topical Q72H Dholakia, [place], AGNP   1 mg at 04/10/23 0951    sodium chloride (NS) 0.9 % flush 10 mL  10 mL Intravenous Q8H [name], MD   10 mL at 04/11/23 0542    zinc oxide-cod liver oil (DESITIN 40%) Paste   Topical Daily PRN Dolores Patty, MD         Current Outpatient Medications Ordered in Epic   Medication Sig Dispense Refill    nirogacestat (OGSIVEO) 150 mg tablet Take 1 tablet (150 mg total) by mouth two (2) times a day. Swallow tablets whole; do not break, crush, or chew. 60 tablet 11     Medications Prior to Admission   Medication Sig Dispense Refill Last Dose/Taking    acetaminophen (TYLENOL) 500 MG tablet Take 2 tablets (1,000 mg total) by mouth every eight (8) hours.       buprenorphine (BUTRANS) 20 mcg/hour PTWK transdermal patch Place 1 patch on the skin every seven (7) days. 4 patch 1     cefdinir (OMNICEF) 300 MG capsule Take 1 capsule (300 mg total) by mouth every twelve (12) hours for 19 days, THEN 1 capsule (300 mg total) daily for 28 days. 66 capsule 0     COURIERED MED OR SUPPLY OGSVEO 50 MG TABLET. 1 each 0     escitalopram oxalate (LEXAPRO) 5 MG tablet Take 1 tablet (5 mg total) by mouth daily. 30 tablet 0     famotidine (PEPCID) 20 MG tablet Take 1 tablet (20 mg total) by mouth nightly. 30 tablet 3     fluticasone propionate (FLONASE) 50 mcg/actuation nasal spray Use 1 spray under the butrans patch once every 7 days to prevent allergic reaction to the adhesive       hydrocortisone 2.5 % cream Apply topically two (2) times a day as needed (hemorrhoids). 30 g 0     lidocaine (ASPERCREME) 4 % patch Place 1 patch on the skin daily as needed.       LORazepam (ATIVAN) 1 MG tablet Take 1 tablet (1 mg total) by mouth two (2) times a day as needed for anxiety. 60 tablet 0     naloxegol (MOVANTIK) 12.5 mg tablet Take 1 tablet (12.5 mg total) by mouth daily. (Patient taking differently: Take 1 tablet (12.5 mg total) by mouth daily as needed.) 30 tablet 0     naloxone (NARCAN) 4 mg nasal spray One spray in either nostril once for known/suspected opioid overdose. May repeat every 2-3 minutes in alternating nostril til EMS arrives 2 each 0     ondansetron (ZOFRAN-ODT) 8 MG disintegrating tablet Dissolve 1 tablet (8 mg total) in the mouth every eight (8) hours as needed for nausea. 90 tablet 0     [EXPIRED] oxyCODONE (ROXICODONE) 5 mg/5 mL solution Take 20 mL (  20 mg total) by mouth every four (4) hours as needed for up to 7 days. 840 mL 0     pantoprazole (PROTONIX) 40 MG tablet Take 1 tablet (40 mg total) by mouth daily before breakfast. 30 tablet 0     promethazine (PHENERGAN) 12.5 MG tablet Take 1 tablet (12.5 mg total) by mouth every twelve (12) hours as needed for nausea. 14 tablet 0     scopolamine (TRANSDERM-SCOP) 1 mg over 3 days Place 1 patch (1 mg total) on the skin every third day. 10 patch 2     sucralfate (CARAFATE) 100 mg/mL suspension Take 10 mL (1 g total) by mouth four (4) times a day as needed.       zinc oxide-cod liver oil (DESITIN 40%) 40 % Pste Apply topically daily as needed. 57 g 0           Vitals:    04/10/23 0620 04/10/23 1400 04/10/23 2141 04/11/23 0529   BP: 111/67 117/77 142/74 133/64   Pulse: 80 80 77 72   Resp: 17 18 17 17    Temp: 36.9 ??C (98.4 ??F) 36.8 ??C (98.2 ??F) 37.1 ??C (98.8 ??F) 36.6 ??C (97.9 ??F)   TempSrc: Oral Oral Oral Oral   SpO2: 97% 98% 96% 93%   Weight:       Height:           Vitals:    Height:  165.1 cm (5' 5), Weight: 54.4 kg (120 lb)    Labs:      CBC -   Lab Results   Component Value Date    WBC 6.2 04/06/2023    RBC 4.04 04/06/2023    HGB 11.0 (L) 04/06/2023    HCT 33.2 (L) 04/06/2023    MCV 82.1 04/06/2023    MCH 27.2 04/06/2023    MCHC 33.1 04/06/2023    MPV 11.1 (H) 04/06/2023    PLT 189 04/06/2023     BMP -   Lab Results   Component Value Date    NA 142 04/10/2023    K 3.9 04/10/2023    CL 99 04/10/2023    CO2 32.0 (H) 04/10/2023    BUN 14 04/10/2023    CREATININE 0.51 (L) 04/10/2023    GFR UNABLE TO CALCULATE GFR.BASED ON PATIENT AGE 39/09/2012 GFRAAF >90 06/10/2019    GFRNAAF >90 06/10/2019    GLU 96 04/10/2023     CARD -   Lab Results   Component Value Date    CKTOTAL 22.0 (L) 04/01/2023    TROPONINI <3 09/26/2022     Coagulation -   Lab Results   Component Value Date    PT 11.3 04/04/2023    INR 0.99 04/04/2023    APTT 33.7 03/25/2023     ABGs- No results found for: PHART, PO2ART, PCO2ART, BEART, HCO3ART, O2SATART  LFT's -   Lab Results   Component Value Date    ALBUMIN 3.7 04/10/2023    ALT 34 04/10/2023    AST 20 04/10/2023    ALKPHOS 120 (H) 04/10/2023    BILITOT 0.2 (L) 04/10/2023    BILIDIR <0.10 03/17/2023    PROT 7.0 04/10/2023       Current Functional Status:  ADLs: Needs assistance with ADLs    ADLs - Needs Assistance: Feeding; Grooming; Bathing; Toileting; UB dressing; LB dressing    Feeding - Needs Assistance: Set Up Assist    Grooming - Needs Assistance: Contact Guard assist; Min assist    Bathing -  Needs Assistance: Min assist; Conservation officer, nature - Needs Assistance: Min assist; Contact Guard assist    UB Dressing - Needs Assistance: Min assist; Contact Guard assist    LB Dressing - Needs Assistance: Min assist; Contact Guard assist         Mobility:   Bed Mobility comments: sup>sit with elevated HOB mod(A) for trunk management. sit>sup min(A) for BLE management. multimodal cues provided for sequencing movement to flat HOB    Transfers: sit>stand from low EOB min(A) w/RW; stand>sit w/c 4x during session SBA w/BUE on arm rests of chair; sit>stand from w/c 2x CGA w/RW, 1x SBA w/counter support, 1x w/R HHA (CGA). Standing at sink to brush teeth ~2 minutes, unilateral UE support and trunk lean on counter for increased stability, of note pt with mild BLE tremors 2/2 muscle fatigue.    Gait: pt amb ~27ft + ~78ft w/SBA w/RW. Pt demonstrates decreased stride length, shuffling-like steps, decreased foot clearance B; minimal knee flexion/ankle DF; slow gait speed; intermittent pause between steps. Cues provided for increased foot clearance, no overt LOB. Pt requires seated rest break after each bout of ambulation.      Cognition/Swallow/Speech:  Patient's Vision Adequate to Safely Complete Daily Activities: Yes    Patient's Judgement Adequate to Safely Complete Daily Activities: Yes    Patient's Memory Adequate to Safely Complete Daily Activities: Yes    Patient Able to Express Needs/Desires: Yes    Patient has speech problem: No      DME Recommendations:  PT DME Recommendations: None    OT DME Recommendations: Defer to post acute      Willingness to Participate: Pt is willing and able to participate in 3 hours of daily therapy.    Rehab Goals and Plan    Expected level of improvement for safe discharge: Patient will discharge home as independent as possible with ADLs and functional mobility with least restrictive assistive device for household distances.     Patient / Family Goals: Patient will safely return home with family, modified independence in ADLs and assist as needed with ambulation for household distances.    Required treatments and services: Rehab nursing, Case management, Dietician/nutrition     Anticipated Interventions:    Physical Therapy: 60-120 min/day 5-7 days/wk  Occupational Therapy: 60-120 min/day 5-7 days/wk  Recreational therapy: 30 min/day 3 days/wk  Prosthetics and Orthotics: As Needed      Anticipated services upon discharge:     Outpatient therapy: PT, OT    Expected discharge destination: Home with family    Discharge support: Patient has a caregiver available    Patient/family/caregiver orientation: Patient and family agreeable to inpatient rehab plan    Estimated Length of Stay: 14 days    Projected Admission Date: Saturday, February 1st, 2025    Reviewer's Signature, Date and Time: Seabron Spates, SLP, MS, CCC-SLP  [facility]  Inpatient Coordinator  Office: [phone]    8:48 AM 04/11/2023

## 2023-04-11 NOTE — Unmapped (Signed)
Physical Medicine and Rehab  Post-admission Physician Evaluation (PAPE)    ASSESSMENT:     [name] [name] is a 23 y.o. female with PMH of [name] s/p proctocolectomy and J-pouch, desmoid tumours, chronic abdominal pain and nausea, anxiety, PTSD related to past medical care, DVT RUE s/p Eliquis x3 months admitted for constellation of worsening pain, intolerance of oral intake. She is now admitted to [facility] for comprehensive interdisciplinary rehabilitation.     Rehab Impairment Group Code [facility]):   (Debility) 16 Debility (Non-Cardiac/Non-Pulmonary)   Etiology: worsening pain, intolerance of oral intake     PLAN:     REHAB:   - PT and OT to maximize functional status with mobility and ADLs as well as prevention of joint contracture.   - P&O for assistive devices PRN.  - To be discussed in weekly Interdisciplinary Team Conference.        Acute on chronic abdominal pain and nausea  Complex situation discussed at length in prior notes. In brief, her pain and nausea are (deeply) multifactorial but consensus among treatment team is that new / unknown organic issue is unlikely. Plan to focus on symptom control, adequate nutrition.  Patient is currently at goal tube feeding, tolerating well.  No visible emesis noted.  Still reports persistent nausea but on aggressive nausea management as below. Chronic pain service consulted, appreciated.  - NO CHANGES to pain regimen without multiparty discussion including patient, unless true emergency.  Seen by chronic pain 1/28, recommended to continue the pain regimen as below no changes made.  - Pain management:  -- oxycodone (liquid) 20 mg q4 prn-first-line for pain  -- changed hydromorphone 1 mg IV q6prn -second line for pain  (ONLY IF inadequate relief 1 hour after oral alternative)  -- buprenorphine 20 mcg/h patch   -- hyoscyamine prn  - Nausea: discuss about transitioning one of the IV nausea medication to p.o.  -- ondansetron IV q8 scheduled  -- promethazine IV q6 PRN (per discussion with primary team prior to transfer to [facility])  -- scopolamine patch  -- olanzapine 5 mg in evening  -- no diphenhydramine IV in absence of clear indication  - Dyspepsia:  -- famotidine bid  -- PPI IV q12  - GI motility:  -- naloxegol prn  -- okay to use loperamide sparingly     Left arm PICC line site swelling, pain and ecchymosis: Left arm PICC line site noted mild swelling and mild ecchymosis from insertion.  No visible swelling noted below the elbow.  PVL confirmed acute DVT, started on anticoagulation and removed PICC line on 1/28, given acute DVT also patient is currently not on any IV antibiotic. Removed PICC line 1/28,  swelling around prior picc line site improving, mild redness/ rash  due to tape, still has bruises but resolving.   -pt has received IV benadryl prn for itching at site; try to avoid if possible     Inadequate oral intake  Abnormal gut anatomy notwithstanding, it is the consensus among hospitalist and GI services that oral and/or enteral feeding should be sufficient to meet caloric and hydration needs. Has not developed refeeding syndrome. TPN will not be offered in forseeable future.  - Consult dietitian during rehab admission  - titrate tube feeds to ~100% USRDI = 65 mL/h: reached at 60cc/hr- tolerated well.   - Per dietitian, patient requested for cyclic feeding , started on cyclic feeding with Nutren 2.0, at goal rate 50 cc/h for 16 hours with acute 4-hour water flushes  150 cc .   - Calorie count per nutrition  - follow BMP, Mg phos q48-72, stop once at goal feeds  - GI to follow and manage wean from enteral nutrition at discharge     Elevated transaminases  Improving: Labs with AST 21, ALT 70, downtrending from prior. No evidence of cholestasis or synthetic dysfunction. Presume drug-related. No longer following closely as trend is well established.     Pouchitis (dx 03/2023), resolving  No plan for repeat endoscopy or colonoscopy during this admission, per GI, given recent exam. No significant decrement in Hb so far  - Cefdinir 300 mg daily (end = 05/05/2023)  - Completed Cefdinir 300 mg bid for 4 weeks (end = 04/07/2023)  - CBC weekly     Desmoid tumors  - okay to hold nirogesat while having difficulty with oral meds, no urgency to resume per Dr [name]     Mood / anxiety  - continue escitalopram  - lorazepam 1 mg bid prn (home medication)      Daily Checklist  - Diet: Regular diet with cyclic tube feeds  - DVT PPX: SQ enoxaparin   - GI PPX: IV protonix BID  - Access: piv    Code status: Full    As part of the admission process, I discussed medical management of this patient???s case with Joesph July, FNP.       DISPO: Admitted to Rehab floor. Patient will be discussed at next interdisciplinary team conference.     Estimated Length of Stay: 14 days    Anticipated Post-Rehab Destination / Needs: home    Medical Necessity:  The patient requires acute inpatient rehabilitation to maximize functional independence and requires daily physician visits for monitoring/management of vital signs, medications, skin, wounds, and pain control. This patient's rehabilitation goals and medical complexity could not adequately be managed in a less intensive setting.  Potential risks for clinical complications include falls, adverse medication reactions, DVT/PE, pressure ulcers, aspiration, urinary tract infection, dehydration/malnutrition, and worsening of underlying disease.     Medical Prognosis: Good for continued progress and participation with therapy.     Anticipated Interdisciplinary Rehabilitation Interventions: Physical therapy to work on mobility. Occupational therapy to work on self care. Recreational therapy for community reintegration and relaxation training. Neuropsych. Rehab Nursing to work on medication administration, patient/family education, skin care, fall prevention, vital signs, and feeding/nutrition. Weekly interdisciplinary team conference to assess progress and plan of care changes.     Expected Functional Outcomes:  Expected level of improvement: Goals for inpatient rehabilitation are modified independence with ambulation, stair negotiation, and ADLs.    SUBJECTIVE:     Reason for Admission: Comprehensive interdisciplinary inpatient rehabilitation program.    History of Present Illness:   [name] [name] is a 23 y/o female with PMHx of [name] s/p proctocolectomy and J-pouch, desmoid tumours, chronic abdominal pain and nausea, anxiety, PTSD related to past medical care, DVT RUE s/p Eliquis x3 months. Ms. Whicker was admitted to [facility] on 04/09/2023 for constellation of worsening pain, intolerance of oral intake. Ms. Caraway has participated in acute inpatient physical and occupational therapies to improve functional mobility, activity tolerance, functional strength, balance, and endurance in order to facilitate safe performance of ADLs and daily routines. He is stable and awaiting rehab placement. Ms. Hansmann has been referred to [facility] for continued acute medical management, provision of intensive inpatient therapies, and patient/family training to facilitate safe performance of ADLs and mobility, prior to discharge home.  Today, patient resting comfortable in bed. Patient reports feeling well and looking forward to coming back to [facility]. She notes nausea which has been stable. No vomiting. Headaches occasionally with no blurred vision or double vision. No SOB or chest pain. Patient was informed of the typical rehab schedule and questions answered regarding inpatient rehab. Patient no further questions and looking forward to rehab.    Pre-Morbid Functional Status: Pre-Admission Functional Status:    Pt reports recently she has been using rollator for household ambulation, WC for community distances. Has had 2 recent falls while ambulating. She was supposed to receive home PT services, however pt did not receive home services and her mother was driving her to outpatient PT appointments. She also reports being so weak recently that she has had to scoot up and down the stairs.     ADLs: Needs assistance with ADLs     ADLs - Needs Assistance: Feeding; Grooming; Bathing; Toileting; UB dressing; LB dressing     Feeding - Needs Assistance: Set Up Assist     Grooming - Needs Assistance: Contact Guard assist; Min assist     Bathing - Needs Assistance: Min assist; Nurse, learning disability - Needs Assistance: Min assist; Contact Guard assist     UB Dressing - Needs Assistance: Min assist; Contact Guard assist     LB Dressing - Needs Assistance: Min assist; Contact Guard assist        Mobility:   Bed Mobility comments: sup>sit with elevated HOB mod(A) for trunk management. sit>sup min(A) for BLE management. multimodal cues provided for sequencing movement to flat HOB     Transfers: sit>stand from low EOB min(A) w/RW; stand>sit w/c 4x during session SBA w/BUE on arm rests of chair; sit>stand from w/c 2x CGA w/RW, 1x SBA w/counter support, 1x w/R HHA (CGA). Standing at sink to brush teeth ~2 minutes, unilateral UE support and trunk lean on counter for increased stability, of note pt with mild BLE tremors 2/2 muscle fatigue.     Gait: pt amb ~58ft + ~10ft w/SBA w/RW. Pt demonstrates decreased stride length, shuffling-like steps, decreased foot clearance B; minimal knee flexion/ankle DF; slow gait speed; intermittent pause between steps. Cues provided for increased foot clearance, no overt LOB. Pt requires seated rest break after each bout of ambulation.        Cognition/Swallow/Speech:  Patient's Vision Adequate to Safely Complete Daily Activities: Yes     Patient's Judgement Adequate to Safely Complete Daily Activities: Yes     Patient's Memory Adequate to Safely Complete Daily Activities: Yes     Patient Able to Express Needs/Desires: Yes     Patient has speech problem: No          Precautions:  Falls    Medical / Surgical History: Reviewed  Past Medical History:   Diagnosis Date    Abdominal pain     Acid reflux     occas    Anesthesia complication     itching, shaking, coldness; last few surgeries have gone much better    Cancer (CMS-HCC)     Cataract of right eye     COVID-19 virus infection 01/2019    Cyst of thyroid determined by ultrasound     monitoring    [name]     2 right forearm, 1 left thigh, 1 right scapula, 1 under left clavicle; multiple    Difficult intravenous access     [name] (familial adenomatous polyposis)  [name]     Gastric polyps     History of chemotherapy     last treatment approx 05/2019    History of colon polyps     History of COVID-19 01/2019    Ileus (CMS-HCC) 03/16/2022    Iron deficiency anemia due to chronic blood loss     received iron infusion 11-2019    PONV (postoperative nausea and vomiting)     Rectal bleeding     Syncopal episodes     especially if becoming dehydrated     Past Surgical History:   Procedure Laterality Date    COLON SURGERY      cyroablation      cystis removal      desmoid removal      PR CLOSE ENTEROSTOMY,RESEC+ANAST N/A 10/09/2020    Procedure: ILEOSTOMY TAKEDOWN;  Surgeon: Mickle Asper, MD;  Location: OR [place];  Service: General Surgery    PR COLONOSCOPY W/BIOPSY SINGLE/MULTIPLE N/A 10/27/2012    Procedure: COLONOSCOPY, FLEXIBLE, PROXIMAL TO SPLENIC FLEXURE; WITH BIOPSY, SINGLE OR MULTIPLE;  Surgeon: Shirlyn Goltz Mir, MD;  Location: PEDS PROCEDURE ROOM [place];  Service: Gastroenterology    PR COLONOSCOPY W/BIOPSY SINGLE/MULTIPLE N/A 09/14/2013    Procedure: COLONOSCOPY, FLEXIBLE, PROXIMAL TO SPLENIC FLEXURE; WITH BIOPSY, SINGLE OR MULTIPLE;  Surgeon: Shirlyn Goltz Mir, MD;  Location: PEDS PROCEDURE ROOM [place];  Service: Gastroenterology    PR COLONOSCOPY W/BIOPSY SINGLE/MULTIPLE N/A 11/08/2014    Procedure: COLONOSCOPY, FLEXIBLE, PROXIMAL TO SPLENIC FLEXURE; WITH BIOPSY, SINGLE OR MULTIPLE;  Surgeon: Arnold Long Mir, MD;  Location: PEDS PROCEDURE ROOM [facility];  Service: Gastroenterology    PR COLONOSCOPY W/BIOPSY SINGLE/MULTIPLE N/A 12/26/2015    Procedure: COLONOSCOPY, FLEXIBLE, PROXIMAL TO SPLENIC FLEXURE; WITH BIOPSY, SINGLE OR MULTIPLE;  Surgeon: Arnold Long Mir, MD;  Location: PEDS PROCEDURE ROOM [facility];  Service: Gastroenterology    PR COLONOSCOPY W/BIOPSY SINGLE/MULTIPLE N/A 09/02/2017    Procedure: COLONOSCOPY, FLEXIBLE, PROXIMAL TO SPLENIC FLEXURE; WITH BIOPSY, SINGLE OR MULTIPLE;  Surgeon: Arnold Long Mir, MD;  Location: PEDS PROCEDURE ROOM [place];  Service: Gastroenterology    PR COLSC FLX W/REMOVAL LESION BY HOT BX FORCEPS N/A 08/27/2016    Procedure: COLONOSCOPY, FLEXIBLE, PROXIMAL TO SPLENIC FLEXURE; W/REMOVAL TUMOR/POLYP/OTHER LESION, HOT BX FORCEP/CAUTE;  Surgeon: Arnold Long Mir, MD;  Location: PEDS PROCEDURE ROOM [facility];  Service: Gastroenterology    PR COLSC FLX W/RMVL OF TUMOR POLYP LESION SNARE TQ N/A 02/25/2019    Procedure: COLONOSCOPY FLEX; W/REMOV TUMOR/LES BY SNARE;  Surgeon: Helyn Numbers, MD;  Location: [facility] PROCEDURES [place] [facility];  Service: Gastroenterology    PR COLSC FLX W/RMVL OF TUMOR POLYP LESION SNARE TQ N/A 03/13/2020    Procedure: COLONOSCOPY FLEX; W/REMOV TUMOR/LES BY SNARE;  Surgeon: Helyn Numbers, MD;  Location: [facility]ED[facility] [place] [facility];  Service: Gastroenterology    PR EXC SKIN BENIG 2.1-3 CM TRUNK,ARM,LEG Right 02/25/2017    Procedure: EXCISION, BENIGN LESION INCLUDE MARGINS, EXCEPT SKIN TAG, LEGS; EXCISED DIAMETER 2.1 TO 3.0 CM;  Surgeon: Clarene Duke, MD;  Location: CHILDRENS OR [facility];  Service: Plastics    PR EXC SKIN BENIG 3.1-4 CM TRUNK,ARM,LEG Right 02/25/2017    Procedure: EXCISION, BENIGN LESION INCLUDE MARGINS, EXCEPT SKIN TAG, ARMS; EXCISED DIAMETER 3.1 TO 4.0 CM;  Surgeon: Clarene Duke, MD;  Location: CHILDRENS OR [facility];  Service: Plastics    PR EXC SKIN BENIG >4 CM FACE,FACIAL Right 02/25/2017    Procedure: EXCISION, OTHER BENIGN LES INCLUD MARGIN, FACE/EARS/EYELIDS/NOSE/LIPS/MUCOUS MEMBRANE; EXCISED DIAM >4.0 CM;  Surgeon: Clare Gandy  Lucretia Roers, MD;  Location: CHILDRENS OR [facility];  Service: Plastics    PR EXC TUMOR SOFT TISSUE LEG/ANKLE SUBQ 3+CM Right 08/05/2019    Procedure: EXCISION, TUMOR, SOFT TISSUE OF LEG OR ANKLE AREA, SUBCUTANEOUS; 3 CM OR GREATER;  Surgeon: Arsenio Katz, MD;  Location: MAIN OR [place];  Service: Plastics    PR EXC TUMOR SOFT TISSUE LEG/ANKLE SUBQ <3CM Right 08/05/2019    Procedure: EXCISION, TUMOR, SOFT TISSUE OF LEG OR ANKLE AREA, SUBCUTANEOUS; LESS THAN 3 CM;  Surgeon: Arsenio Katz, MD;  Location: MAIN OR [facility];  Service: Plastics    PR LAP, SURG PROCTECTOMY W J-POUCH N/A 08/10/2020    Procedure: ROBOTIC ASSISTED LAPAROSCOPIC PROCTOCOLECTOMY, ILEAL J POUCH, WITH OSTOMY;  Surgeon: Mickle Asper, MD;  Location: OR [place];  Service: General Surgery    PR NDSC EVAL INTSTINAL POUCH DX W/COLLJ SPEC SPX N/A 01/23/2021    Procedure: ENDO EVAL SM INTEST POUCH; DX;  Surgeon: Modena Nunnery, MD;  [facility]: [facility] [place] [facility];  Service: Gastroenterology    PR NDSC EVAL INTSTINAL POUCH DX W/COLLJ SPEC SPX N/A 08/27/2021    Procedure: ENDO EVAL SM INTEST POUCH; DX;  Surgeon: Hunt Oris, MD;  Location: [facility] PROCEDURES [facility];  Service: Gastroenterology    PR NDSC EVAL INTSTINAL POUCH DX W/COLLJ SPEC SPX N/A 12/09/2021    Procedure: ENDO EVAL SM INTEST POUCH; DX;  Surgeon: Vidal Schwalbe, MD;  Location: GI PROCEDURES [facility];  Service: Gastroenterology    PR NDSC EVAL INTSTINAL POUCH DX W/COLLJ [facility] SPX Left 04/09/2022    Procedure: ENDO EVAL SM INTEST POUCH; DX;  Surgeon: Modena Nunnery, MD;  [facility] [place] [facility];  Service: Gastroenterology    PR NDSC EVAL INTSTINAL POUCH DX W/COLLJ SPEC SPX N/A 08/05/2022    Procedure: ENDO EVAL SM INTEST POUCH; DX;  Surgeon: Modena Nunnery, MD;  Location: GI PROCEDURES [facility];  Service: Gastroenterology    PR NDSC EVAL INTSTINAL POUCH DX W/COLLJ SPEC SPX N/A 03/13/2023    Procedure: ENDO EVAL SM INTEST POUCH; DX;  Surgeon: Carmon Ginsberg, MD;  Location: GI PROCEDURES [facility];  Service: Gastroenterology    PR NDSC EVAL INTSTINAL POUCH W/BX SINGLE/MULTIPLE N/A 01/20/2022    Procedure: ENDOSCOPIC EVAL OF SMALL INTESTINAL POUCH; DIAGNOSTIC, No biopsies;  Surgeon: Andrey Farmer, MD;  Location: GI PROCEDURES [facility];  Service: Gastroenterology    PR NDSC EVAL INTSTINAL POUCH W/BX SINGLE/MULTIPLE N/A 02/13/2022    Procedure: ENDOSCOPIC EVAL OF SMALL INTESTINAL POUCH; DIAGNOSTIC, WITH BIOPSY;  Surgeon: Bronson Curb, MD;  Location: GI PROCEDURES [facility];  Service: Gastroenterology    PR NDSC EVAL INTSTINAL POUCH W/BX SINGLE/MULTIPLE N/A 03/13/2023    Procedure: ENDOSCOPIC EVAL OF SMALL INTESTINAL POUCH; DIAGNOSTIC, WITH BIOPSY;  Surgeon: Carmon Ginsberg, MD;  Location: GI PROCEDURES [facility];  Service: Gastroenterology    PR UNLISTED PROCEDURE SMALL INTESTINE  01/23/2021    Procedure: UNLISTED PROCEDURE, SMALL INTESTINE;  Surgeon: Modena Nunnery, MD;  Location: [facility]URES [place] [facility];  Service: Gastroenterology    PR UNLISTED PROCEDURE SMALL INTESTINE  02/13/2022    Procedure: UNLISTED PROCEDURE, SMALL INTESTINE;  Surgeon: Bronson Curb, MD;  Location: GI PROCEDURES [facility];  Service: Gastroenterology    PR UPPER GI ENDOSCOPY,BIOPSY N/A 10/27/2012    Procedure: UGI ENDOSCOPY; WITH BIOPSY, SINGLE OR MULTIPLE;  Surgeon: Shirlyn Goltz Mir, MD;  Location: PEDS PROCEDURE ROOM [facility];  Service: Gastroenterology    PR UPPER GI ENDOSCOPY,BIOPSY  N/A 09/14/2013    Procedure: UGI ENDOSCOPY; WITH BIOPSY, SINGLE OR MULTIPLE;  Surgeon: Shirlyn Goltz Mir, MD;  Location: PEDS PROCEDURE ROOM [facility];  Service: Gastroenterology    PR UPPER GI ENDOSCOPY,BIOPSY N/A 11/08/2014    Procedure: UGI ENDOSCOPY; WITH BIOPSY, SINGLE OR MULTIPLE;  Surgeon: Arnold Long Mir, MD;  Location: PEDS PROCEDURE ROOM [facility];  Service: Gastroenterology    PR UPPER GI ENDOSCOPY,BIOPSY N/A 12/26/2015    Procedure: UGI ENDOSCOPY; WITH BIOPSY, SINGLE OR MULTIPLE;  Surgeon: Arnold Long Mir, MD;  Location: PEDS PROCEDURE ROOM [place];  Service: Gastroenterology    PR UPPER GI ENDOSCOPY,BIOPSY N/A 08/27/2016    Procedure: UGI ENDOSCOPY; WITH BIOPSY, SINGLE OR MULTIPLE;  Surgeon: Arnold Long Mir, MD;  Location: PEDS PROCEDURE ROOM [facility];  Service: Gastroenterology    PR UPPER GI ENDOSCOPY,BIOPSY N/A 09/02/2017    Procedure: UGI ENDOSCOPY; WITH BIOPSY, SINGLE OR MULTIPLE;  Surgeon: Arnold Long Mir, MD;  Location: PEDS PROCEDURE ROOM [facility];  Service: Gastroenterology    PR UPPER GI ENDOSCOPY,BIOPSY N/A 03/13/2020    Procedure: UGI ENDOSCOPY; WITH BIOPSY, SINGLE OR MULTIPLE;  Surgeon: Helyn Numbers, MD;  Location: [facility] [place] [facility];  Service: Gastroenterology    PR UPPER GI ENDOSCOPY,BIOPSY N/A 09/05/2021    Procedure: UGI ENDOSCOPY; WITH BIOPSY, SINGLE OR MULTIPLE;  Surgeon: Wendall Papa, MD;  Location: GI PROC[facility]URES [facility];  Service: Gastroenterology    PR UPPER GI ENDOSCOPY,DIAGNOSIS N/A 01/20/2022    Procedure: UGI ENDO, INCLUDE ESOPHAGUS, STOMACH, & DUODENUM &/OR JEJUNUM; DX W/WO COLLECTION SPECIMN, BY BRUSH OR WASH;  Surgeon: Andrey Farmer, MD;  Location: GI PROCEDURES [facility];  Service: Gastroenterology    TUMOR REMOVAL      multiple-head, neck, back, hand, right flank, multiple     Social History: Reviewed  Social History     Tobacco Use    Smoking status: Never     Passive exposure: Past    Smokeless tobacco: Never   Vaping Use    Vaping status: Never Used   Substance Use Topics    Alcohol use: Never    Drug use: Never          Family History: Pertinent as stated and otherwise reviewed and non-contributory   family history includes Alcohol abuse in her paternal grandfather; Arthritis in her maternal grandfather; Asthma in her maternal grandfather; COPD in her paternal grandmother; Cancer in her maternal grandmother; Diabetes in her maternal grandmother; Hypertension in her maternal grandmother; Miscarriages / Stillbirths in her paternal grandmother; No Known Problems in her brother, father, maternal aunt, maternal uncle, mother, paternal aunt, paternal uncle, and sister; Other in her maternal grandmother; Stroke in her maternal grandmother; Thyroid disease in her maternal grandmother.    Allergies: Reviewed  Adhesive tape-silicones; Ferrlecit [sodium ferric gluconat-sucrose]; Levofloxacin; Methylnaltrexone; Neomycin; Papaya; Morphine; Zosyn [piperacillin-tazobactam]; Compazine [prochlorperazine]; Iron analogues; Reglan [metoclopramide hcl]; Iron dextran; and Latex, natural rubber    Medications at Discharge from Acute Hospital: Reviewed     Your Medication List        ASK your doctor about these medications      acetaminophen 650 mg/20.3 mL Soln  Commonly known as: TYLENOL  31.2 mL (1,000 mg total) by Enteral tube: post-pyloric (duodenum, jejunum) route every eight (8) hours.     ammonium lactate 12 % lotion  Commonly known as: LAC-HYDRIN  Apply 1 Application topically two (2) times a day.     buprenorphine 20 mcg/hour Ptwk transdermal patch  Commonly known as: BUTRANS  Place 1 patch  on the skin once a week.  Start taking on: April 15, 2023     cefdinir 250 mg/5 mL suspension  Commonly known as: OMNICEF  Take 6 mL (300 mg total) by mouth daily for 23 days.  Start taking on: April 12, 2023     cetirizine 1 mg/mL syrup  Commonly known as: ZYRTEC  Take 20 mL (20 mg total) by mouth two (2) times a day.     diphenhydrAMINE 12.5 mg/5 mL liquid  Commonly known as: BENADRYL  Take 5 mL (12.5 mg total) by mouth two (2) times a day as needed for itching.     enoxaparin 60 mg/0.6 mL injection  Commonly known as: LOVENOX  Inject 0.5 mL (50 mg total) under the skin every twelve (12) hours.     escitalopram oxalate 5 mg/5 mL solution  Commonly known as: LEXAPRO  5 mL (5 mg total) by J-Tube route daily.  Start taking on: April 12, 2023     famotidine 40 mg/5 mL (8 mg/mL) suspension  Commonly known as: PEPCID  2.5 mL (20 mg total) by J-Tube route nightly.     fluticasone propionate 50 mcg/actuation nasal spray  Commonly known as: FLONASE  1 spray into each nostril once a week.  Start taking on: April 15, 2023     guar gum Pack  Commonly known as: NUTRISOURCE  1 packet by Enteral tube: post-pyloric (duodenum, jejunum) route Three (3) times a day.     hydrocortisone 2.5 % cream  Apply topically two (2) times a day as needed (hemorrhoids).     hydrocortisone 1 % cream  Apply topically two (2) times a day as needed (itching).     HYDROmorphone (PF) 1 mg/mL injection  Commonly known as: DILAUDID  Infuse 1 mL (1 mg total) into a venous catheter every six (6) hours as needed (2nd line for severe pain that does not improve 1 hour after giving oxycodone) for up to 5 days.     hyoscyamine 0.125 mg tablet  Commonly known as: LEVSIN  Take 1 tablet (125 mcg total) by mouth every four (4) hours as needed.     lidocaine 4 % patch  Commonly known as: ASPERCREME  Place 1 patch on the skin daily.  Start taking on: April 12, 2023     melatonin 3 mg Tab  Take 1 tablet (3 mg total) by mouth nightly as needed.     multivitamin with folic acid 400 mcg Tab tablet  Take 1 tablet by mouth daily.  Start taking on: April 12, 2023     naloxegol 12.5 mg tablet  Commonly known as: MOVANTIK  Take 1 tablet (12.5 mg total) by mouth daily as needed (constipation).     OLANZapine zydis 5 MG disintegrating tablet  Commonly known as: ZYPREXA  Take 1 tablet (5 mg total) by mouth every evening.     ondansetron 4 mg/2 mL injection  Commonly known as: ZOFRAN  Infuse 4 mL (8 mg total) into a venous catheter every eight (8) hours.     oxyCODONE 5 mg/5 mL solution  Commonly known as: ROXICODONE  Take 20 mL (20 mg total) by mouth every four (4) hours as needed for up to 5 days.     pantoprazole 40 mg injection  Commonly known as: Protonix  Infuse 10 mL (40 mg total) into a venous catheter two (2) times a day. scopolamine 1 mg over 3 days  Commonly known as: TRANSDERM-SCOP  Place 1 patch (1  mg total) on the skin every third day.  Start taking on: April 13, 2023     zinc oxide-cod liver oil 40 % Pste  Commonly known as: DESITIN 40%  Apply topically daily as needed (perineal irritation).            Review of Systems:    Full 10 systems reviewed and negative, other than as noted in the HPI.    OBJECTIVE:     Vitals:  Temp:  [36.6 ??C (97.9 ??F)-37.1 ??C (98.8 ??F)] 36.9 ??C (98.4 ??F)  Heart Rate:  [72-86] 86  Resp:  [17] 17  BP: (123-142)/(64-74) 123/69  MAP (mmHg):  [83-89] 85  SpO2:  [93 %-99 %] 98 %  BMI (Calculated):  [20.29] 20.29      Physical Exam:  GEN: Laying in bed in NAD. Resting comfortably  EYES: Sclera anicteric, conjunctiva clear   HENT: NCAT, MMM, OP clear  NECK: Trachea midline  RESP: CTAB, NWOB   CV: RRR, no m/r/g, ext no c/c/e, radial and DPP 2+ b/l  GI: Abd soft, NTND, NABS   SKIN: No visible masses, lesions, rashes, ecchymoses, or lacerations  MSK: FROM in BUE and BLE, no notable contractures, no visible swelling or erythema over joints, joints NTTP  NEURO:   Mental Status: A&Ox3, speech fluid and coherent, follows commands well and answers questions appropriately   Cranial Nerve: Visual acuity 20/20 bilaterally, no visual fields deficits, PERRLA, EOMI. Facial sensation intact V1-V3 bilaterally. Full facial movements and symmetric smile. Hearing grossly intact bilaterally. Uvula midline. Shoulder shrug full and equal. Tongue midline and no abnormal movements.   Motor:   - RUE 4/5 shoulder abd, 4/5 EF, 4-/5 EE, 3/5 WE, 4+/5 HG  - LUE 4/5 shoulder abd, 4/5 EF, 4-/5 EE, 3/5 WE, 4-/5 HG  - RLE 2/5 HF, 1/5 KE, 0/5 DF, 3/5 PF, 3/5 EHL  - LLE 2/5 HF, 1/5 KE, 0/5 DF, 3/5 PF, 3/5 EHL   Tone: within normal limits, no spasticity noted    PSYCH: mood euthymic, affect appropriate, thought process logical     Labs and Diagnostic Studies: Reviewed    Radiology Results: Reviewed  Michiel Sites, DO  [facility]   Physical Medicine and Rehabilitation, PGY2        Dictation software was used while making this note. Please excuse any errors made with dictation software and interpret errors as such.

## 2023-04-11 NOTE — Unmapped (Addendum)
AOx4, x1 assist up with walker, family at the bedside and call bell within reach. Abdominal pain controlled with Dilaudid and Oxycodone prn. On Nutren 2.0 cyclic tube feeds.     Problem: Adult Inpatient Plan of Care  Goal: Plan of Care Review  Outcome: Progressing  Goal: Patient-Specific Goal (Individualized)  Outcome: Progressing  Goal: Absence of Hospital-Acquired Illness or Injury  Outcome: Progressing  Intervention: Identify and Manage Fall Risk  Recent Flowsheet Documentation  Taken 04/11/2023 0600 by Silvio Pate, RN  Safety Interventions: fall reduction program maintained  Taken 04/11/2023 0400 by Silvio Pate, RN  Safety Interventions: fall reduction program maintained  Taken 04/11/2023 0200 by Silvio Pate, RN  Safety Interventions:   fall reduction program maintained   family at bedside  Taken 04/11/2023 0000 by Silvio Pate, RN  Safety Interventions:   family at bedside   fall reduction program maintained  Taken 04/10/2023 2200 by Candi Leash D, RN  Safety Interventions:   family at bedside   fall reduction program maintained  Taken 04/10/2023 2000 by Candi Leash D, RN  Safety Interventions:   fall reduction program maintained   family at bedside  Intervention: Prevent Skin Injury  Recent Flowsheet Documentation  Taken 04/11/2023 0600 by Silvio Pate, RN  Positioning for Skin: Supine/Back  Skin Protection: adhesive use limited  Taken 04/11/2023 0400 by Silvio Pate, RN  Positioning for Skin: Supine/Back  Skin Protection: adhesive use limited  Taken 04/11/2023 0200 by Silvio Pate, RN  Positioning for Skin: Supine/Back  Skin Protection: adhesive use limited  Taken 04/11/2023 0000 by Silvio Pate, RN  Positioning for Skin: Supine/Back  Skin Protection: adhesive use limited  Taken 04/10/2023 2200 by Silvio Pate, RN  Positioning for Skin: Supine/Back  Skin Protection: adhesive use limited  Taken 04/10/2023 2000 by Candi Leash D, RN  Positioning for Skin: Supine/Back  Skin Protection: adhesive use limited  Goal: Optimal Comfort and Wellbeing  Outcome: Progressing  Goal: Readiness for Transition of Care  Outcome: Progressing  Goal: Rounds/Family Conference  Outcome: Progressing     Problem: Infection  Goal: Absence of Infection Signs and Symptoms  Outcome: Progressing     Problem: Latex Allergy  Goal: Absence of Allergy Symptoms  Outcome: Progressing     Problem: Oncology Care  Goal: Effective Coping  Outcome: Progressing  Goal: Improved Activity Tolerance  Outcome: Progressing  Goal: Optimal Oral Intake  Outcome: Progressing  Goal: Improved Oral Mucous Membrane Integrity  Outcome: Progressing  Goal: Optimal Pain Control and Function  Outcome: Progressing     Problem: Self-Care Deficit  Goal: Improved Ability to Complete Activities of Daily Living  Outcome: Progressing     Problem: Fall Injury Risk  Goal: Absence of Fall and Fall-Related Injury  Outcome: Progressing  Intervention: Promote Injury-Free Environment  Recent Flowsheet Documentation  Taken 04/11/2023 0600 by Silvio Pate, RN  Safety Interventions: fall reduction program maintained  Taken 04/11/2023 0400 by Silvio Pate, RN  Safety Interventions: fall reduction program maintained  Taken 04/11/2023 0200 by Silvio Pate, RN  Safety Interventions:   fall reduction program maintained   family at bedside  Taken 04/11/2023 0000 by Silvio Pate, RN  Safety Interventions:   family at bedside   fall reduction program maintained  Taken 04/10/2023 2200 by Silvio Pate, RN  Safety Interventions:   family at bedside   fall reduction program maintained  Taken 04/10/2023 2000 by Silvio Pate, RN  Safety  Interventions:   fall reduction program maintained   family at bedside     Problem: Skin Injury Risk Increased  Goal: Skin Health and Integrity  Outcome: Progressing  Intervention: Optimize Skin Protection  Recent Flowsheet Documentation  Taken 04/11/2023 0600 by Joyner-Little, Joana Reamer, RN  Pressure Reduction Techniques: frequent weight shift encouraged  Pressure Reduction Devices: pressure-redistributing mattress utilized  Skin Protection: adhesive use limited  Taken 04/11/2023 0400 by Joyner-Little, Shalee Paolo D, RN  Pressure Reduction Techniques: frequent weight shift encouraged  Pressure Reduction Devices: pressure-redistributing mattress utilized  Skin Protection: adhesive use limited  Taken 04/11/2023 0200 by Joyner-Little, Joana Reamer, RN  Pressure Reduction Techniques: frequent weight shift encouraged  Pressure Reduction Devices: pressure-redistributing mattress utilized  Skin Protection: adhesive use limited  Taken 04/11/2023 0000 by Silvio Pate, RN  Pressure Reduction Techniques: frequent weight shift encouraged  Pressure Reduction Devices: pressure-redistributing mattress utilized  Skin Protection: adhesive use limited  Taken 04/10/2023 2200 by Joyner-Little, Charlsie Fleeger D, RN  Pressure Reduction Techniques: frequent weight shift encouraged  Pressure Reduction Devices: pressure-redistributing mattress utilized  Skin Protection: adhesive use limited  Taken 04/10/2023 2000 by Candi Leash D, RN  Pressure Reduction Techniques: frequent weight shift encouraged  Pressure Reduction Devices: pressure-redistributing mattress utilized  Skin Protection: adhesive use limited     Problem: Pain Acute  Goal: Optimal Pain Control and Function  Outcome: Progressing

## 2023-04-11 NOTE — Unmapped (Signed)
Physician Discharge Summary [facility]  [facility]  [address]  [place] [address]  Dept: [phone]  Loc: [phone]     Identifying Information:   Merrill Deanda Linarez  [birth year]  [id number]    Primary Care Physician: Jarold Motto, [facility]   Code Status: Full Code    Admit Date: 03/25/2023    Discharge Date: 04/11/2023     Discharge To: Acute Inpatient Rehab    Discharge Service: [facility] - Hospitalist [place] [facility]     Discharge Attending Physician: Joesph July, FNP    Discharge Diagnoses:  Principal Problem:    Intractable abdominal pain (POA: Yes)  Active Problems:    [name] (POA: Yes)    Cancer associated pain (POA: Yes)    Pain medication agreement signed (POA: Not Applicable)    Pouchitis (CMS-HCC) (POA: Yes)    Elevated liver transaminase level (POA: No)    Positive blood culture (POA: Yes)  Resolved Problems:    * No resolved hospital problems. *      Outpatient Provider Follow Up Issues:   -Follow-up with the PCP as prior  -Follow-up with the GI in clinic-  GI to follow and manage wean from enteral nutrition at discharge.   -Follow-up with chronic pain clinic    Hospital Course:   Omya Winfield is a 23 y.o. 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     # Acute on chronic abdominal pain/ Nausea  Complex situation discussed at length in prior notes. In brief, her pain and nausea are (deeply) multifactorial but consensus among treatment team is that new / unknown organic issue at time of this admission was unlikely. No cross-sectional imaging or endoscopy was performed. Anesthesia chronic pain service was consulted and recommended adhering to plan set out by her outpatient prescriber (Dr Darlis Loan): buprenorphine 20 mcg/h patch, oxycodone 20 mg po q4, hydromorphone 1 mg IV q4 to be used only after trying oral medications.  Chronic pain was involved while inpatient given challenge in controlling her pain.  Chronic pain continue to follow, managed with buprenorphine 20 mcg/h patch, oxycodone (liquid) 20 mg q4 prn-first-line for pain, and hydromorphone 1 mg IV q6prn -second line for pain  (ONLY IF inadequate relief 1 hour after oral alternative).Titrated dilaudid to q6h prn from q4h prn.  Continued this same pain regimen upon discharge to [facility].  Discussed with the provider that chronic pain team will not be following or giving any recommendations regarding pain medication while in AIR.  AIR team/ provider is aware to wean off IV Dilaudid prior to discharge.  Patient will follow-up with outpatient pain clinic upon discharge for further assistance.  Received IV Zofran and IV Phenergan for nausea control along with PPI, scopolamine patch, olanzapine 5 mg in the evening.  Continued current nausea regimen on discharge to AIR.  Patient remained hemodynamically stable through the stay and at discharge.       # Positive blood cx Strep mitis  Blood cultures collected as part of initial ED evaluation grew Strep mitis in 1 of 2 sets. ID consulted and she was treated with vancomycin and ertapenem, subsequently narrowed to vancomycin alone for total of 7 days. Repeat cultures 03/27/2023 negative. Strongly suspect contaminant due to suboptimal technique. Note that she developed itching with vancomycin infusion that required slower infusion and  pretreatment with diphenhydramine.    # Presyncope  Inadequate hydration, weight loss are plausible causes. Improved with restoration of adequate hydration and nutrition.     Poor PO intake / Nausea  Severe Protein-Calorie Malnutrition in the context of chronic illness (03/27/23 1311)  As extensively documented in prior admissions and outpatient visits, she has struggled with constellation of nausea, dyspepsia, bloating, abdominal pain, loose stools and aversion to oral intake for years. She has complex GI history and has been supported with TPN in the past. Tempo of symptoms has accelerated since mid-2024. Had ND tube (Corpak) placed during prolonged admission 09/2022 - 11/2022, which remained in place through 02/2023 as outpatient team was working on weaning from tube feeds. She continued to report poor intake and was losing weight since tube was removed (54.1 kg on 02/24/2023 --> 52.8 kg on 03/24/2023), though weight trend difficult to interpret in context of significant anasarca that developed in mid-2024.    It is in that context that she requested admission for re-initiation of TPN when she came to an infusion appointment on 03/25/2023. This has been discussed with her primary outpatient providers including Dr [name] (GI / IBD), Dr Carmon Sails (GI / nutrition), Dr [name] (oncology), and [name] NP (oncology) who recommended against TPN. Abnormal gut anatomy notwithstanding, it is the consensus among multiple specialties that oral and (if that fails) enteral feeding should be sufficient to meet caloric and hydration needs. Objective data was reassuring -- normotensive, not tachycardic, moist mucous membranes, benign abdominal exam, normal electrolytes, albumin 3.7. Direct admission was not felt to be warranted, but she proceeded to the ED for evaluation and was subsequently admitted to the hospitalist service. GI service was consulted and recommended maximizing supportive care for nausea, dyspepsia while addressing other contributors to nausea and aversion to oral intake. Despite these efforts she continued to have little meaningful intake even of oral medications so another ND tube (corpak)was placed on 03/29/2023 and feeds slowly titrated up to goal. Feeding regimen at discharge was cyclic feeding with Nutren 2.0, at goal rate 50 cc/h for 16 hours with every 3 hour water flushes with 75cc as pt did not tolerate  Q4-hour water flushes 150 cc which was recommended by dietitian. GI to follow and manage wean from enteral nutrition at discharge.     # Left arm PICC line site swelling, pain and ecchymosis: Left arm PICC line site noted mild swelling and mild ecchymosis from insertion.  No visible swelling noted below the elbow.  PVL confirmed acute DVT, started on anticoagulation and removed PICC line on 1/28, given acute DVT also patient is currently not on any IV antibiotic.  Removed PICC line 1/28,  swelling around prior picc line site improving, mild redness/ rash due to tape improved, still has bruises which is resolving slowly.  Continue anticoagulation with Lovenox on discharge to complete at least 3 months therapy,    # Recent mild pouchitis  # Ongoing small-volume hematochezia  Patient with total abdominal colectomy with J pouch in 2022. Recent admission (12/29-1/10) for pouchitis, with pouchoscopy 03/13/23 showing minimal inflammation consistent with healing pouchitis. She had been taking cefdinir for planned 8 week course (300 mg BID 1/1-1/28, then 300 mg daily 1/29-2/25). She reported ongoing BRBPR with each bowel movement and frequent watery stools. Her vital signs and Hgb were stable on admission (Hgb 12.8) and VSS.   GI consulted and recommended no indication for repeat pouchoscopy. Held cefdinir while treating Strep mitis bacteremia with ertapenem as above, then  resumed cefdinir 300 mg bid to complete 4 weeks (end = 04/07/2023) then daily for 4 weeks (end = 05/05/2023)  .  Recommend close follow-up with GI in clinic.     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 [name]. Patient has numerous sites of disease but most bothersome has been mesenteric sites which have caused chronic abdominal pain, nausea/vomiting. She presents at this time due to inadequately controlled abdominal pain and nausea, which have made it difficult for her to take in food or water. Please see Dr. Brynda Rim note from 1/14 addended 1/15 with his very thoughtful assessment of this situation and current hospitalization.   Continued home nirogacestat as tolerated. Patient tried holding it for a few days to see if it helped abdominal symptoms. okay to hold nirogesat while having difficulty with oral meds, no urgency to resume per Dr [name] .    Procedures:  None  No admission procedures for hospital encounter.  ______________________________________________________________________  Discharge Medications:     Your Medication List        STOP taking these medications      acetaminophen 500 MG tablet  Commonly known as: TYLENOL  Replaced by: acetaminophen 650 mg/20.3 mL Soln     cefdinir 300 MG capsule  Commonly known as: OMNICEF  Replaced by: cefdinir 250 mg/5 mL suspension     COURIERED MED OR SUPPLY     escitalopram oxalate 5 MG tablet  Commonly known as: LEXAPRO  Replaced by: escitalopram oxalate 5 mg/5 mL solution     famotidine 20 MG tablet  Commonly known as: PEPCID  Replaced by: famotidine 40 mg/5 mL (8 mg/mL) suspension     LORazepam 1 MG tablet  Commonly known as: ATIVAN     naloxone 4 mg/actuation nasal spray  Commonly known as: NARCAN     OGSIVEO 50 mg tablet  Generic drug: nirogacestat     ondansetron 8 MG disintegrating tablet  Commonly known as: ZOFRAN-ODT     pantoprazole 40 MG tablet  Commonly known as: Protonix  Replaced by: pantoprazole 40 mg injection     promethazine 12.5 MG tablet  Commonly known as: PHENERGAN     sucralfate 100 mg/mL suspension  Commonly known as: CARAFATE            START taking these medications      acetaminophen 650 mg/20.3 mL Soln  Commonly known as: TYLENOL  31.2 mL (1,000 mg total) by Enteral tube: post-pyloric (duodenum, jejunum) route every eight (8) hours.  Replaces: acetaminophen 500 MG tablet     ammonium lactate 12 % lotion  Commonly known as: LAC-HYDRIN  Apply 1 Application topically two (2) times a day.     cefdinir 250 mg/5 mL suspension  Commonly known as: OMNICEF  Take 6 mL (300 mg total) by mouth daily for 23 days.  Start taking on: April 12, 2023  Replaces: cefdinir 300 MG capsule     cetirizine 1 mg/mL syrup  Commonly known as: ZYRTEC  Take 20 mL (20 mg total) by mouth two (2) times a day.     diphenhydrAMINE 12.5 mg/5 mL elixir  Commonly known as: BENADRYL  Take 5 mL (12.5 mg total) by mouth two (2) times a day as needed for itching.     enoxaparin 60 mg/0.6 mL injection  Commonly known as: LOVENOX  Inject 0.5 mL (50 mg total) under the skin every twelve (12) hours.     escitalopram oxalate 5 mg/5 mL solution  Commonly known as: LEXAPRO  5 mL (5 mg total) by J-Tube route daily.  Start taking on: April 12, 2023  Replaces: escitalopram oxalate 5 MG tablet     famotidine 40 mg/5 mL (8 mg/mL) suspension  Commonly known as: PEPCID  2.5 mL (20 mg total) by J-Tube route nightly.  Replaces: famotidine 20 MG tablet     guar gum Pack  Commonly known as: NUTRISOURCE  1 packet by Enteral tube: post-pyloric (duodenum, jejunum) route Three (3) times a day.     HYDROmorphone (PF) 1 mg/mL injection  Commonly known as: DILAUDID  Infuse 1 mL (1 mg total) into a venous catheter every six (6) hours as needed (2nd line for severe pain that does not improve 1 hour after giving oxycodone) for up to 5 days.     hyoscyamine 0.125 mg tablet  Commonly known as: LEVSIN  Take 1 tablet (125 mcg total) by mouth every four (4) hours as needed.     melatonin 3 mg Tab  Take 1 tablet (3 mg total) by mouth nightly as needed.     multivitamin with folic acid 400 mcg Tab tablet  Take 1 tablet by mouth daily.  Start taking on: April 12, 2023     OLANZapine zydis 5 MG disintegrating tablet  Commonly known as: ZYPREXA  Take 1 tablet (5 mg total) by mouth every evening.     ondansetron 4 mg/2 mL injection  Commonly known as: ZOFRAN  Infuse 4 mL (8 mg total) into a venous catheter every eight (8) hours.     pantoprazole 40 mg injection  Commonly known as: Protonix  Infuse 10 mL (40 mg total) into a venous catheter two (2) times a day.  Replaces: pantoprazole 40 MG tablet            CHANGE how you take these medications      buprenorphine 20 mcg/hour Ptwk transdermal patch  Commonly known as: BUTRANS  Place 1 patch on the skin once a week.  Start taking on: April 15, 2023  What changed: when to take this     fluticasone propionate 50 mcg/actuation nasal spray  Commonly known as: FLONASE  1 spray into each nostril once a week.  Start taking on: April 15, 2023  What changed:   how much to take  how to take this  when to take this  additional instructions     hydrocortisone 2.5 % cream  Apply topically two (2) times a day as needed (hemorrhoids).  What changed: Another medication with the same name was added. Make sure you understand how and when to take each.     hydrocortisone 1 % cream  Apply topically two (2) times a day as needed (itching).  What changed: You were already taking a medication with the same name, and this prescription was added. Make sure you understand how and when to take each.     lidocaine 4 % patch  Commonly known as: ASPERCREME  Place 1 patch on the skin daily.  Start taking on: April 12, 2023  What changed:   when to take this  reasons to take this     naloxegol 12.5 mg tablet  Commonly known as: MOVANTIK  Take 1 tablet (12.5 mg total) by mouth daily as needed (constipation).  What changed:   when to take this  reasons to take this     zinc oxide-cod liver oil 40 % Pste  Commonly known as: DESITIN 40%  Apply topically  daily as needed (perineal irritation).  What changed: reasons to take this            CONTINUE taking these medications      oxyCODONE 5 mg/5 mL solution  Commonly known as: ROXICODONE  Take 20 mL (20 mg total) by mouth every four (4) hours as needed for up to 5 days.     scopolamine 1 mg over 3 days  Commonly known as: TRANSDERM-SCOP  Place 1 patch (1 mg total) on the skin every third day.  Start taking on: April 13, 2023              Allergies:  Adhesive tape-silicones; Ferrlecit [sodium ferric gluconat-sucrose]; Levofloxacin; Methylnaltrexone; Neomycin; Papaya; Morphine; Zosyn [piperacillin-tazobactam]; Compazine [prochlorperazine]; Iron analogues; Reglan [metoclopramide hcl]; Iron dextran; and Latex, natural rubber  ______________________________________________________________________  Pending Test Results (if blank, then none):      Most Recent Labs:  All lab results last 24 hours - No results found for this or any previous visit (from the past 24 hours).    Relevant Studies/Radiology (if blank, then none):    ______________________________________________________________________  Discharge Instructions:         Follow Up instructions and Outpatient Referrals     Discharge instructions          Other Instructions       Discharge instructions      -Follow-up with the PCP as prior  -Follow-up with the GI in clinic-  GI to follow and manage wean from enteral nutrition at discharge.   -Follow-up with chronic pain clinic            Appointments which have been scheduled for you      Apr 12, 2023 9:00 AM  PT EVAL with Jonelle Sidle Ramger, PT  [facility] PHYSICAL THERAPY [facility] REGION) [address]  [place] [address]        Apr 12, 2023 10:00 AM  TREATMENT with Jonelle Sidle Ramger, PT  [facility] PHYSICAL THERAPY [facility] REGION) [address]  [place] [address]        Apr 12, 2023 1:00 PM  OT EVAL with Maureen Ralphs, OT  [facility] OCCUPATIONAL THERAPY [facility] REGION) [address]  [place] [address]        Apr 17, 2023 8:15 AM  (Arrive by 7:45 AM)  LAB ONLY [place] with ADULT ONC LAB  [facility] ADULT ONCOLOGY LAB DRAW STATION [place] [facility] REGION) [address]  [place] [address]  [phone]        Apr 17, 2023 9:00 AM  (Arrive by 8:30 AM)  RETURN SARCOMA F/U [place] with Sheral Apley, MD  [facility] ONCOLOGY MULTIDISCIPLINARY 2ND FLR CANCER HOSP [facility] REGION) [address]  [place] [address]  [phone]        Apr 29, 2023 9:30 AM  (Arrive by 9:00 AM)  NEW VIDEO MYCHART with Amy Elizebeth Brooking, PhD  [facility] PAIN MANAGEMENT CENTER [address] [place] [facility] REGION) [address]  [address]  [place] [address]  [phone]   Please sign into My [place] Chart at least 15 minutes before your appointment to complete the eCheck-In process. You must complete eCheck-In before you can start your video visit. We also recommend testing your audio and video connection to troubleshoot any issues before your visit begins. Click ???Join Video Visit??? to complete these checks. Once you  have completed eCheck-In and tested your audio and video, click ???Join Call??? to connect to your visit.     For your video visit, you will need a computer with a working camera, speaker and microphone, a smartphone, or a tablet with internet access.    My [place] Chart enables you to manage your health, send non-urgent messages to your provider, view your test results, schedule and manage appointments, and request prescription refills securely and conveniently from your computer or mobile device.    You can go to [email or web address] to sign in to your My [place] Chart account with your username and password. If you have forgotten your username or password, please choose the ???Forgot Username???? and/or ???Forgot Password???? links to gain access. You also can access your My [place] Chart account with the free MyChart mobile app for Android or iPhone.    If you need assistance accessing your [facility] [place] Chart account or for assistance in reaching your provider's office to reschedule or cancel your appointment, please call [facility] [phone].         May 11, 2023 3:00 PM  (Arrive by 2:45 PM)  RETURN  GENERAL with Carollee Leitz, MD  [facility] PHYSICAL MEDICINE [facility] [address] [facility] REGION) [phone] [address] Endo  [place] [address]  [phone] Jun 11, 2023 9:00 AM  (Arrive by 8:45 AM)  RETURN IBD with Modena Nunnery, MD  [facility] GI MEDICINE [place] [facility] REGION) [address]  [facility] 1 through 4  [place] [address]  [phone]        Aug 11, 2023 10:00 AM  (Arrive by 9:45 AM)  RETURN HEM BENIGN with Archie Endo, MD  [facility] BENIGN HEMATOLOGY CLINIC [place] [facility] REGION) [address]  [facility] 1 through 4  [place] [address]  [phone]             ______________________________________________________________________  Discharge Day Services:  BP 133/64  - Pulse 72  - Temp 36.6 ??C (97.9 ??F) (Oral)  - Resp 17  - Ht 165.1 cm (5' 5)  - Wt 54.4 kg (120 lb)  - LMP 12/09/2022 (Approximate)  - SpO2 93%  - BMI 19.97 kg/m??   Pt seen on the day of discharge and determined appropriate for discharge.    Condition at Discharge: good    Length of Discharge: I spent greater than 40 mins in the discharge of this patient.

## 2023-04-12 MED ADMIN — famotidine (PEPCID) oral suspension: 20 mg | ENTERAL | @ 03:00:00

## 2023-04-12 MED ADMIN — sodium chloride (NS) 0.9 % flush 10 mL: 10 mL | INTRAVENOUS | @ 19:00:00

## 2023-04-12 MED ADMIN — HYDROmorphone (PF) (DILAUDID) injection 1 mg: 1 mg | INTRAVENOUS | @ 06:00:00 | Stop: 2023-04-17

## 2023-04-12 MED ADMIN — acetaminophen (TYLENOL) oral liquid: 1000 mg | ENTERAL | @ 19:00:00

## 2023-04-12 MED ADMIN — ondansetron (ZOFRAN) injection 8 mg: 8 mg | INTRAVENOUS | @ 03:00:00

## 2023-04-12 MED ADMIN — cetirizine (ZYRTEC) oral syrup: 20 mg | ENTERAL | @ 03:00:00

## 2023-04-12 MED ADMIN — oxyCODONE (ROXICODONE) 5 mg/5 mL solution 20 mg: 20 mg | ORAL | @ 15:00:00 | Stop: 2023-04-19

## 2023-04-12 MED ADMIN — lidocaine (ASPERCREME) 4 % 1 patch: 1 | TRANSDERMAL | @ 13:00:00

## 2023-04-12 MED ADMIN — promethazine (PHENERGAN) 12.5 mg in sodium chloride 0.9 % 50 mL IVPB (premix): 12.5 mg | INTRAVENOUS | @ 16:00:00

## 2023-04-12 MED ADMIN — HYDROmorphone (PF) (DILAUDID) injection 1 mg: 1 mg | INTRAVENOUS | @ 19:00:00 | Stop: 2023-04-17

## 2023-04-12 MED ADMIN — guar gum (NUTRISOURCE) 1 packet: 1 | ENTERAL | @ 13:00:00

## 2023-04-12 MED ADMIN — oxyCODONE (ROXICODONE) 5 mg/5 mL solution 20 mg: 20 mg | ORAL | @ 04:00:00 | Stop: 2023-04-19

## 2023-04-12 MED ADMIN — oxyCODONE (ROXICODONE) 5 mg/5 mL solution 20 mg: 20 mg | ORAL | Stop: 2023-04-19

## 2023-04-12 MED ADMIN — multivitamin with folic acid 400 mcg tablet 1 tablet: 1 | ORAL | @ 13:00:00

## 2023-04-12 MED ADMIN — HYDROmorphone (PF) (DILAUDID) injection 1 mg: 1 mg | INTRAVENOUS | @ 13:00:00 | Stop: 2023-04-17

## 2023-04-12 MED ADMIN — ammonium lactate (LAC-HYDRIN) 12 % lotion 1 Application: 1 | TOPICAL | @ 03:00:00

## 2023-04-12 MED ADMIN — cefdinir (OMNICEF) oral suspension: 300 mg | ORAL | @ 13:00:00 | Stop: 2023-05-06

## 2023-04-12 MED ADMIN — acetaminophen (TYLENOL) oral liquid: 1000 mg | ENTERAL | @ 03:00:00

## 2023-04-12 MED ADMIN — sodium chloride (NS) 0.9 % flush 10 mL: 10 mL | INTRAVENOUS | @ 03:00:00

## 2023-04-12 MED ADMIN — enoxaparin (LOVENOX) syringe 50 mg: 1 mg/kg | SUBCUTANEOUS | @ 03:00:00

## 2023-04-12 MED ADMIN — ammonium lactate (LAC-HYDRIN) 12 % lotion 1 Application: 1 | TOPICAL | @ 13:00:00

## 2023-04-12 MED ADMIN — enoxaparin (LOVENOX) syringe 50 mg: 1 mg/kg | SUBCUTANEOUS | @ 13:00:00

## 2023-04-12 MED ADMIN — escitalopram oxalate (LEXAPRO) tablet 5 mg: 5 mg | ENTERAL | @ 13:00:00

## 2023-04-12 MED ADMIN — pantoprazole (Protonix) injection 40 mg: 40 mg | INTRAVENOUS | @ 13:00:00

## 2023-04-12 MED ADMIN — acetaminophen (TYLENOL) oral liquid: 1000 mg | ENTERAL | @ 11:00:00

## 2023-04-12 MED ADMIN — ondansetron (ZOFRAN) injection 8 mg: 8 mg | INTRAVENOUS | @ 11:00:00

## 2023-04-12 MED ADMIN — sodium chloride (NS) 0.9 % flush 10 mL: 10 mL | INTRAVENOUS | @ 11:00:00

## 2023-04-12 MED ADMIN — promethazine (PHENERGAN) 12.5 mg in sodium chloride 0.9 % 50 mL IVPB (premix): 12.5 mg | INTRAVENOUS | @ 05:00:00

## 2023-04-12 MED ADMIN — promethazine (PHENERGAN) 12.5 mg in sodium chloride 0.9 % 50 mL IVPB (premix): 12.5 mg | INTRAVENOUS | @ 22:00:00

## 2023-04-12 MED ADMIN — oxyCODONE (ROXICODONE) 5 mg/5 mL solution 20 mg: 20 mg | ORAL | @ 20:00:00 | Stop: 2023-04-19

## 2023-04-12 MED ADMIN — ondansetron (ZOFRAN) injection 8 mg: 8 mg | INTRAVENOUS | @ 19:00:00

## 2023-04-12 MED ADMIN — OLANZapine zydis (ZYPREXA) disintegrating tablet 5 mg: 5 mg | ORAL | @ 03:00:00

## 2023-04-12 MED ADMIN — guar gum (NUTRISOURCE) 1 packet: 1 | ENTERAL | @ 03:00:00

## 2023-04-12 MED ADMIN — pantoprazole (Protonix) injection 40 mg: 40 mg | INTRAVENOUS | @ 03:00:00

## 2023-04-12 NOTE — Unmapped (Signed)
OCCUPATIONAL THERAPY  Evaluation (04/12/23 1400)      Patient Name:  [name] [name]      Medical Record Number: [id number]   Date of Birth: [birth year]  Gender: Female         Post-Discharge Occupational Therapy Recommendations      OT Post Acute Discharge Recommendations: TBD, Pt does not require family training     OT DME Recommendations: To Be Determined      Barriers to Discharge: Home environment barriers, Mobility deficits, Self-Care deficits     Assessment/Session Summary     Treatment Diagnosis: Patient presenting with decreased ability to complete self care skills and functional transfers secondary to decreased BLE strength, decreased BUE strength, decreased static/dynamic standing balance, decreased functional activity tolerance, decreased BUE gross/fine motor coordination, and current medical status.     Assessment: [name] [name] is a 23 y.o. female with PMH of [name] s/p proctocolectomy and J-pouch, desmoid tumours, chronic abdominal pain and nausea, anxiety, PTSD related to past medical care, DVT RUE s/p Eliquis x3 months admitted for constellation of worsening pain, intolerance of oral intake. She is now admitted to [facility] for comprehensive interdisciplinary rehabilitation. Patient presenting with decreased ability to complete self care skills and functional transfers secondary to decreased BLE strength, decreased BUE strength, decreased static/dynamic standing balance, decreased functional activity tolerance, decreased BUE gross/fine motor coordination, and current medical status. At this time, patient would benefit from skilled OT services for improved independence in self care skills and functional transfers.     **Patient's mother able to assist patient to/from TTB using RW. Education provided on safe TTB/wheelchair set up as well as body mechanics - OT cleared patient's mother to assist with bathing tasks. Board updated and RN notified.**     Problem List: Decreased strength, Decreased activity tolerance, Decreased endurance, Decreased mobility, Impaired ADLs, Impaired balance, Pain, Decreased range of motion, Decreased coordination, Impaired fine motor skills, Fall risk    Personal Factors/Comorbidities (Occupational Profile and History Review): Extensive (High)  Specific Comorbidities : PMH of [name] s/p proctocolectomy and J-pouch, desmoid tumours, chronic abdominal pain and nausea, anxiety, PTSD related to past medical care, DVT RUE s/p Eliquis x3 months  Assessment of Occupational Performance : Balance, Dexterity, Endurance, Fine or gross motor coordination, Mobility, Strength        Clinical Decision Making: High Complexity     Today's Interventions: ADL retraining, Education - Patient, Education - Family / caregiver, Functional mobility, Transfer training  Today's Interventions: OT evaluation; ADL training; AIR orientation    Patient state at end of session: Patient agreeable to OT; seated upright in wheelchair at end of session; family present.    Subjective      Prognosis: Good     Positive Indicators: Motivation; family support     Prior Level of Function and Home Situation     Prior functional status: Prior to hospitalization, patient was previously modI with basic ADLs and functional mobility using either a rollator/RW inside the home or her personal wheelchair in the community. 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 Patient's mother, boyfriend, and sister are available to assist as needed after DC. Patient reported many falls within the past few months often because she was attempting to ambulate without an AD.     Service Patient receives prior to hospitalization: None         Patient / Caregiver reports: My hands are worse compared to the last time you saw me.  Living Situation  Living Environment: House  Lives With: Father, Mother  Home Living: Multi-level home, 1/2 bath on main level, Bed/bath upstairs, Stairs to alternate level with rails  Number of Stairs to Enter (outside): 2 (TBD if they have a rail)  Rail placement (inside): Rail on right side  Number of Stairs to Alternate level (inside): 14  Caregiver Identified?: Yes  Caregiver Availability: 24 hours  Caregiver Ability: Limited lifting     Equipment available at home: Bedside commode, Shower chair with back, Sock aid, Walker - Rolling, [employer], Rollator     Vitals/Orthostatics: No s/s of dizziness during session     Past Medical History:   Diagnosis Date    Abdominal pain     Acid reflux     occas    Anesthesia complication     itching, shaking, coldness; last few surgeries have gone much better    Cancer (CMS-HCC)     Cataract of right eye     COVID-19 virus infection 01/2019    Cyst of thyroid determined by ultrasound     monitoring    [name]     2 right forearm, 1 left thigh, 1 right scapula, 1 under left clavicle; multiple    Difficult intravenous access     [name] (familial adenomatous polyposis)     [name]     Gastric polyps     History of chemotherapy     last treatment approx 05/2019    History of colon polyps     History of COVID-19 01/2019    Ileus (CMS-HCC) 03/16/2022    Iron deficiency anemia due to chronic blood loss     received iron infusion 11-2019    PONV (postoperative nausea and vomiting)     Rectal bleeding     Syncopal episodes     especially if becoming dehydrated      Social History     Tobacco Use    Smoking status: Never     Passive exposure: Past    Smokeless tobacco: Never   Substance Use Topics    Alcohol use: Never     Past Surgical History:   Procedure Laterality Date    COLON SURGERY      cyroablation      cystis removal      desmoid removal      PR CLOSE ENTEROSTOMY,RESEC+ANAST N/A 10/09/2020    Procedure: ILEOSTOMY TAKEDOWN;  Surgeon: Mickle Asper, MD;  Location: OR [place];  Service: General Surgery    PR COLONOSCOPY W/BIOPSY SINGLE/MULTIPLE N/A 10/27/2012    Procedure: COLONOSCOPY, FLEXIBLE, PROXIMAL TO SPLENIC FLEXURE; WITH BIOPSY, SINGLE OR MULTIPLE;  Surgeon: Shirlyn Goltz Mir, MD;  Location: PEDS PROCEDURE ROOM [place];  Service: Gastroenterology    PR COLONOSCOPY W/BIOPSY SINGLE/MULTIPLE N/A 09/14/2013    Procedure: COLONOSCOPY, FLEXIBLE, PROXIMAL TO SPLENIC FLEXURE; WITH BIOPSY, SINGLE OR MULTIPLE;  Surgeon: Shirlyn Goltz Mir, MD;  Location: PEDS PROCEDURE ROOM [place];  Service: Gastroenterology    PR COLONOSCOPY W/BIOPSY SINGLE/MULTIPLE N/A 11/08/2014    Procedure: COLONOSCOPY, FLEXIBLE, PROXIMAL TO SPLENIC FLEXURE; WITH BIOPSY, SINGLE OR MULTIPLE;  Surgeon: Arnold Long Mir, MD;  Location: PEDS PROCEDURE ROOM [facility];  Service: Gastroenterology    PR COLONOSCOPY W/BIOPSY SINGLE/MULTIPLE N/A 12/26/2015    Procedure: COLONOSCOPY, FLEXIBLE, PROXIMAL TO SPLENIC FLEXURE; WITH BIOPSY, SINGLE OR MULTIPLE;  Surgeon: Arnold Long Mir, MD;  Location: PEDS PROCEDURE ROOM [facility];  Service: Gastroenterology    PR COLONOSCOPY W/BIOPSY SINGLE/MULTIPLE N/A 09/02/2017    Procedure: COLONOSCOPY, FLEXIBLE,  PROXIMAL TO SPLENIC FLEXURE; WITH BIOPSY, SINGLE OR MULTIPLE;  Surgeon: Arnold Long Mir, MD;  Location: PEDS PROCEDURE ROOM [facility];  Service: Gastroenterology    PR COLSC FLX W/REMOVAL LESION BY HOT BX FORCEPS N/A 08/27/2016    Procedure: COLONOSCOPY, FLEXIBLE, PROXIMAL TO SPLENIC FLEXURE; W/REMOVAL TUMOR/POLYP/OTHER LESION, HOT BX FORCEP/CAUTE;  Surgeon: Arnold Long Mir, MD;  Location: PEDS PROCEDURE ROOM [facility];  Service: Gastroenterology    PR COLSC FLX W/RMVL OF TUMOR POLYP LESION SNARE TQ N/A 02/25/2019    Procedure: COLONOSCOPY FLEX; W/REMOV TUMOR/LES BY SNARE;  Surgeon: Helyn Numbers, MD;  Location: [facility] PROCEDURES [place] [facility];  Service: Gastroenterology    PR COLSC FLX W/RMVL OF TUMOR POLYP LESION SNARE TQ N/A 03/13/2020    Procedure: COLONOSCOPY FLEX; W/REMOV TUMOR/LES BY SNARE;  Surgeon: Helyn Numbers, MD;  Location: [facility] [place] [facility];  Service: Gastroenterology    PR EXC SKIN BENIG 2.1-3 CM TRUNK,ARM,LEG Right 02/25/2017    Procedure: EXCISION, BENIGN LESION INCLUDE MARGINS, EXCEPT SKIN TAG, LEGS; EXCISED DIAMETER 2.1 TO 3.0 CM;  Surgeon: Clarene Duke, MD;  Location: CHILDRENS OR [facility];  Service: Plastics    PR EXC SKIN BENIG 3.1-4 CM TRUNK,ARM,LEG Right 02/25/2017    Procedure: EXCISION, BENIGN LESION INCLUDE MARGINS, EXCEPT SKIN TAG, ARMS; EXCISED DIAMETER 3.1 TO 4.0 CM;  Surgeon: Clarene Duke, MD;  Location: CHILDRENS OR [facility];  Service: Plastics    PR EXC SKIN BENIG >4 CM FACE,FACIAL Right 02/25/2017    Procedure: EXCISION, OTHER BENIGN LES INCLUD MARGIN, FACE/EARS/EYELIDS/NOSE/LIPS/MUCOUS MEMBRANE; EXCISED DIAM >4.0 CM;  Surgeon: Clarene Duke, MD;  Location: CHILDRENS OR [facility];  Service: Plastics    PR EXC TUMOR SOFT TISSUE LEG/ANKLE SUBQ 3+CM Right 08/05/2019    Procedure: EXCISION, TUMOR, SOFT TISSUE OF LEG OR ANKLE AREA, SUBCUTANEOUS; 3 CM OR GREATER;  Surgeon: Arsenio Katz, MD;  Location: MAIN OR [place];  Service: Plastics    PR EXC TUMOR SOFT TISSUE LEG/ANKLE SUBQ <3CM Right 08/05/2019    Procedure: EXCISION, TUMOR, SOFT TISSUE OF LEG OR ANKLE AREA, SUBCUTANEOUS; LESS THAN 3 CM;  Surgeon: Arsenio Katz, MD;  Location: MAIN OR [facility];  Service: Plastics    PR LAP, SURG PROCTECTOMY W J-POUCH N/A 08/10/2020    Procedure: ROBOTIC ASSISTED LAPAROSCOPIC PROCTOCOLECTOMY, ILEAL J POUCH, WITH OSTOMY;  Surgeon: Mickle Asper, MD;  Location: OR [place];  Service: General Surgery    PR NDSC EVAL INTSTINAL POUCH DX W/COLLJ SPEC SPX N/A 01/23/2021    Procedure: ENDO EVAL SM INTEST POUCH; DX;  Surgeon: Modena Nunnery, MD;  Location: [facility] [place] [facility];  Service: Gastroenterology    PR NDSC EVAL INTSTINAL POUCH DX W/COLLJ SPEC SPX N/A 08/27/2021    Procedure: ENDO EVAL SM INTEST POUCH; DX;  Surgeon: Hunt Oris, MD;  Location: GI PROCEDURES [facility];  Service: Gastroenterology    PR NDSC EVAL INTSTINAL POUCH DX W/COLLJ SPEC SPX N/A 12/09/2021    Procedure: ENDO EVAL SM INTEST POUCH; DX;  Surgeon: Vidal Schwalbe, MD;  Location: [facility] PROC[facility]URES [facility];  Service: Gastroenterology    PR NDSC EVAL INTSTINAL POUCH DX W/COLLJ [facility] SPX Left 04/09/2022    Procedure: ENDO EVAL SM INTEST POUCH; DX;  Surgeon: Modena Nunnery, MD;  Location: [facility] [place] [facility];  Service: Gastroenterology    PR NDSC EVAL INTSTINAL POUCH DX W/COLLJ SPEC SPX N/A 08/05/2022    Procedure: ENDO EVAL SM INTEST POUCH; DX;  Surgeon: Modena Nunnery, MD;  Location: GI PROCEDURES [facility];  Service: Gastroenterology    PR NDSC EVAL INTSTINAL POUCH DX W/COLLJ SPEC SPX N/A 03/13/2023    Procedure: ENDO EVAL SM INTEST POUCH; DX;  Surgeon: Carmon Ginsberg, MD;  Location: GI PROCEDURES [facility];  Service: Gastroenterology    PR NDSC EVAL INTSTINAL POUCH W/BX SINGLE/MULTIPLE N/A 01/20/2022    Procedure: ENDOSCOPIC EVAL OF SMALL INTESTINAL POUCH; DIAGNOSTIC, No biopsies;  Surgeon: Andrey Farmer, MD;  Location: GI PROCEDURES [facility];  Service: Gastroenterology    PR NDSC EVAL INTSTINAL POUCH W/BX SINGLE/MULTIPLE N/A 02/13/2022    Procedure: ENDOSCOPIC EVAL OF SMALL INTESTINAL POUCH; DIAGNOSTIC, WITH BIOPSY;  Surgeon: Bronson Curb, MD;  Location: GI PROCEDURES [facility];  Service: Gastroenterology    PR NDSC EVAL INTSTINAL POUCH W/BX SINGLE/MULTIPLE N/A 03/13/2023    Procedure: ENDOSCOPIC EVAL OF SMALL INTESTINAL POUCH; DIAGNOSTIC, WITH BIOPSY;  Surgeon: Carmon Ginsberg, MD;  Location: GI PROCEDURES [facility];  Service: Gastroenterology    PR UNLISTED PROCEDURE SMALL INTESTINE  01/23/2021    Procedure: UNLISTED PROCEDURE, SMALL INTESTINE;  Surgeon: Modena Nunnery, MD;  Location: [facility] [place] [facility];  Service: Gastroenterology    PR UNLISTED PROCEDURE SMALL INTESTINE  02/13/2022    Procedure: UNLISTED PROCEDURE, SMALL INTESTINE;  Surgeon: Bronson Curb, MD;  Location: GI PROCEDURES [facility];  Service: Gastroenterology PR UPPER GI ENDOSCOPY,BIOPSY N/A 10/27/2012    Procedure: UGI ENDOSCOPY; WITH BIOPSY, SINGLE OR MULTIPLE;  Surgeon: Shirlyn Goltz Mir, MD;  Location: PEDS PROCEDURE ROOM [facility];  Service: Gastroenterology    PR UPPER GI ENDOSCOPY,BIOPSY N/A 09/14/2013    Procedure: UGI ENDOSCOPY; WITH BIOPSY, SINGLE OR MULTIPLE;  Surgeon: Shirlyn Goltz Mir, MD;  Location: PEDS PROCEDURE ROOM [facility];  Service: Gastroenterology    PR UPPER GI ENDOSCOPY,BIOPSY N/A 11/08/2014    Procedure: UGI ENDOSCOPY; WITH BIOPSY, SINGLE OR MULTIPLE;  Surgeon: Arnold Long Mir, MD;  Location: PEDS PROCEDURE ROOM [facility];  Service: Gastroenterology    PR UPPER GI ENDOSCOPY,BIOPSY N/A 12/26/2015    Procedure: UGI ENDOSCOPY; WITH BIOPSY, SINGLE OR MULTIPLE;  Surgeon: Arnold Long Mir, MD;  Location: PEDS PROCEDURE ROOM [facility];  Service: Gastroenterology    PR UPPER GI ENDOSCOPY,BIOPSY N/A 08/27/2016    Procedure: UGI ENDOSCOPY; WITH BIOPSY, SINGLE OR MULTIPLE;  Surgeon: Arnold Long Mir, MD;  Location: PEDS PROCEDURE ROOM [facility];  Service: Gastroenterology    PR UPPER GI ENDOSCOPY,BIOPSY N/A 09/02/2017    Procedure: UGI ENDOSCOPY; WITH BIOPSY, SINGLE OR MULTIPLE;  Surgeon: Arnold Long Mir, MD;  Location: PEDS PROCEDURE ROOM [facility];  Service: Gastroenterology    PR UPPER GI ENDOSCOPY,BIOPSY N/A 03/13/2020    Procedure: UGI ENDOSCOPY; WITH BIOPSY, SINGLE OR MULTIPLE;  Surgeon: Helyn Numbers, MD;  Location: [facility] [place] [facility];  Service: Gastroenterology    PR UPPER GI ENDOSCOPY,BIOPSY N/A 09/05/2021    Procedure: UGI ENDOSCOPY; WITH BIOPSY, SINGLE OR MULTIPLE;  Surgeon: Wendall Papa, MD;  Location: GI PROCEDURES [facility];  Service: Gastroenterology    PR UPPER GI ENDOSCOPY,DIAGNOSIS N/A 01/20/2022    Procedure: UGI ENDO, INCLUDE ESOPHAGUS, STOMACH, & DUODENUM &/OR JEJUNUM; DX W/WO COLLECTION SPECIMN, BY BRUSH OR WASH;  Surgeon: Andrey Farmer, MD;  Location: GI PROCEDURES [facility];  Service: Gastroenterology    TUMOR REMOVAL      multiple-head, neck, back, hand, right flank, multiple      Family History   Problem Relation Age of Onset    No Known Problems Mother     No Known Problems Father  No Known Problems Sister     No Known Problems Brother     Stroke Maternal Grandmother     Other Maternal Grandmother         benign lesions of liver and pancreas, further details unknown    Cancer Maternal Grandmother     Diabetes Maternal Grandmother     Hypertension Maternal Grandmother     Thyroid disease Maternal Grandmother     Arthritis Maternal Grandfather     Asthma Maternal Grandfather     COPD Paternal Grandmother         Deceased    Miscarriages / Stillbirths Paternal Grandmother     Alcohol abuse Paternal Grandfather         Deceased    No Known Problems Maternal Aunt     No Known Problems Maternal Uncle     No Known Problems Paternal Aunt     No Known Problems Paternal Uncle     Anesthesia problems Neg Hx     Broken bones Neg Hx     Cancer Neg Hx     Clotting disorder Neg Hx     Collagen disease Neg Hx     Diabetes Neg Hx     Dislocations Neg Hx     Fibromyalgia Neg Hx     Gout Neg Hx     Hemophilia Neg Hx     Osteoporosis Neg Hx     Rheumatologic disease Neg Hx     Scoliosis Neg Hx     Severe sprains Neg Hx     Sickle cell anemia Neg Hx     Spinal Compression Fracture Neg Hx     Melanoma Neg Hx     Basal cell carcinoma Neg Hx     Squamous cell carcinoma Neg Hx         Adhesive tape-silicones; Ferrlecit [sodium ferric gluconat-sucrose]; Levofloxacin; Methylnaltrexone; Neomycin; Papaya; Morphine; Zosyn [piperacillin-tazobactam]; Compazine [prochlorperazine]; Iron analogues; Reglan [metoclopramide hcl]; Iron dextran; and Latex, natural rubber    Objective Findings     Medical Tests / Procedures: Reviewed in EPIC      Activity Tolerance: Tolerated treatment well     Precautions: Falls precautions       Weight Bearing Status: Non-applicable     Required Braces or Orthoses: Non-applicable     Equipment / Environment: Wheelchair, Rolling walker, NGT Communication Preference: Verbal     Pain Comments: No reports of pain during session                    Cognition  Orientation Level: Oriented x 4  Arousal/Alertness: Appropriate responses to stimuli  Attention Span: Appears intact  Memory: Appears intact  Following Commands: Follows all commands and directions without difficulty  Safety Judgment: Good awareness of safety precautions  Awareness of Errors: Good awareness of errors made  Problem Solving: Able to problem solve independently  Comments: Patient pleasant and cooperative throughout; motivated to work with therapist     Vision  Baseline Vision: No deficits identified  Acute Visual Changes: No acute deficits identified    Hearing  Hearing: No deficit identified       Hand Function  Right Hand Function: Right hand grip strength, ROM and coordination WNL  Right Hand Impairment: grip strength fair, serial opposition impaired, finger to nose impaired  Left Hand Function: Left hand function impaired  Left Hand Impairment: grip strength fair, serial opposition impaired, finger to nose impaired  Hand Function comments: BUE grip strength fair (  RUE weakner than LUE); increased time required to complete serial opposition, finger <> nose, and dysdiadokinesia  Hand Dominance: Right     Skin Inspection  Skin Inspection: Intact where visualized     UE ROM - Strength  UE ROM/Strength: Left Impaired/Limited, Right Impaired/Limited  RUE Impairment: Reduced strength, Limited AROM  LUE Impairment: Reduced strength, Limited AROM  UE ROM/ Strength Comment: Patient only able to range BUE shoulders to ~90 degrees flexion secondary to weakness. R winged scapula present from baseline tumor. BUE bicep/triceps 3+/5 MMT    LE ROM / Strength  LE ROM/Strength: Right Impaired/Limited, Left Impaired/Limited  RLE Impairment: Reduced strength, Limited AROM  LE ROM/ Strength Comment: See PT evaluation for formal testing     Coordination  Coordination: Impaired     Sensation  RUE Sensation: RUE impaired  RUE Sensation Impairment: Numbness, Tingling (Patient reported numbness/tingling at digits 4 & 5)  LUE Sensation: LUE intact  RLE Sensation: RLE impaired  RLE Sensation Impairment: Numbness, Tingling  LLE Sensation: LLE impaired  LLE Sensation Impairment: Numbness, Tingling  Sensory/ Proprioception/ Stereognosis comments: Patient reporting neuropathy in bilateral feet     Balance: Patient required CGA to minA for sit <> stands and stand step transfers with RW     Functional Mobility  Transfer Assistance Needed: Yes  Transfers: Min assist (Patient required CGA to minA for sit <> stands and stand step transfers with RW)  Bed Mobility Assistance Needed: Yes  Bed Mobility - Needs Assistance:  (Patient OOB for OT evaluation)  Ambulation: Patient required CGA to minA for sit <> stands and stand step transfers with RW      IADLs: Not tested     GOALS:   Patient and Family Goals: To work on my writing, hand mobility, and walking.     Eating   Discharge Goal: Independent  Assistance Needed: Set-up / clean-up  Comment: Assist to open containers  CARE Score - Eating: 5    Oral Hygiene  Discharge Goal: Independent  Assistance Needed: Physical assistance  Physical Assistance Level: Total assistance  Comment: Stands to complete oral care at baseline  CARE Score - Oral Hygiene: 1         Toileting Hygiene  Discharge Goal: Independent  Assistance Needed: Incidental touching  Comment: Using 3in1 commode & RW  CARE Score - Toileting Hygiene: 4    Toileting Transfer  Discharge Goal: Independent  Assistance Needed: Physical assistance  Physical Assistance Level: 25% or less  Comment: Stand step transfer to/from 3in1 commode using RW  CARE Score - Toilet Transfer: 3           Shower/Bathe  Discharge Goal: Independent  Goal details:: Using AE as needed  Reason if not Attempted: Safety concerns  CARE Score - Shower/Bathe Self: 88           UE Dressing  Discharge Goal: Independent  Assistance Needed: Physical assistance  Physical Assistance Level: 26%-50%  CARE Score - Upper Body Dressing: 3     LE Dressing  Discharge Goal: Independent  Goal details:: Using AE as needed  Assistance Needed: Physical assistance  Physical Assistance Level: 76% or more  CARE Score - Lower Body Dressing: 2    Footwear  Discharge Goal: Independent  Goal details:: Using AE as needed  Assistance Needed: Physical assistance  Physical Assistance Level: Total assistance  CARE Score - Putting On/Taking Off Footwear: 1          Short and Long Term Goals  SHORT GOAL #  1: Patient will complete BUE fine/gross motor coordination dexterity testing.       SHORT GOAL #2: Patient will demonstrate good understanding of BUE grip/pinch strengthening HEP.            Long Term Goal #1: Patient will complete basic ADLs and functional transfers at a modI level in prep for DC.       Plan     Follow-up / Frequency  Minutes per day: 1-2 hours/day  Days per Week: 5-7     Planned Interventions: Adaptive equipment, ADL retraining, Balance activities, Bed mobility, Compensatory tech. training, Conservation, Education - Patient, Education - Family / caregiver, Endurance activities, Neuromuscular re-education, Modalities, Home exercise program, Functional mobility, Environmental support, Passive range of motion, Positioning, Range of motion, Safety education, UE Strength / coordination exercise, Transfer training, Therapeutic exercise, Wheelchair training, Wheelchair evaluation / modification    OT Individual [mins]: 60      I attest that I have reviewed the above information.  Signed: Maureen Ralphs, OT  Filed 04/12/2023

## 2023-04-12 NOTE — Unmapped (Signed)
VSS. A&O x4. Pain managed with Oxy and Dilaudid. Phenergan and Zofran given for nausea. TF running continuously. NG patent and maintained. No signs of trauma or injury throughout shift. Falls precautions maintained. Will continue to monitor.            Problem: Rehabilitation (IRF) Plan of Care  Goal: Plan of Care Review  Outcome: Progressing  Goal: Patient-Specific Goal (Individualized)  Outcome: Progressing  Goal: Absence of New-Onset Illness or Injury  Outcome: Progressing  Intervention: Prevent Fall and Fall Injury  Recent Flowsheet Documentation  Taken 04/11/2023 2200 by [name] Mates, RN  Safety Interventions:   low bed   lighting adjusted for tasks/safety   fall reduction program maintained   family at bedside   nonskid shoes/slippers when out of bed   room near unit station  Intervention: Prevent Skin Injury  Recent Flowsheet Documentation  Taken 04/11/2023 2200 by [name] Mates, RN  Skin Protection: adhesive use limited  Intervention: Prevent Infection  Recent Flowsheet Documentation  Taken 04/11/2023 2200 by [name] Mates, RN  Infection Prevention:   cohorting utilized   rest/sleep promoted   single patient room provided  Goal: Optimal Comfort and Wellbeing  Outcome: Progressing  Goal: Home and Community Transition Plan Established  Outcome: Progressing  Goal: Rounds/Family Conference  Outcome: Progressing     Problem: Self-Care Deficit  Goal: Improved Ability to Complete Activities of Daily Living  Outcome: Progressing

## 2023-04-12 NOTE — Unmapped (Signed)
Physical Medicine and Rehabilitation  Daily Progress Note [facility]    ASSESSMENT:     [name] [name] is a 23 y.o. female 23 y.o. female with PMH of [name] s/p proctocolectomy and J-pouch, desmoid tumours, chronic abdominal pain and nausea, anxiety, PTSD related to past medical care, DVT RUE s/p Eliquis x3 months admitted for constellation of worsening pain, intolerance of oral intake. She is now admitted to [facility] for comprehensive interdisciplinary rehabilitation.      Rehab Impairment Group Code [facility]):   (Debility) 16 Debility (Non-Cardiac/Non-Pulmonary)   Etiology: worsening pain, intolerance of oral intake     PLAN:     This patient is admitted to the Physical Medicine and Rehabilitation - Inpatient - A service from 8am-5pm on weekdays for questions regarding this patient. After hours, weekends, and holidays please contact the 1st Call resident pager     REHAB:   - PT and OT to maximize functional status with mobility and ADLs as well as prevention of joint contracture.   - P&O for assistive devices PRN.  - To be discussed in weekly Interdisciplinary Team Conference.        Acute on chronic abdominal pain and nausea  Complex situation discussed at length in prior notes. In brief, her pain and nausea are (deeply) multifactorial but consensus among treatment team is that new / unknown organic issue is unlikely. Plan to focus on symptom control, adequate nutrition.  Patient is currently at goal tube feeding, tolerating well.  No visible emesis noted.  Still reports persistent nausea but on aggressive nausea management as below. Chronic pain service consulted, appreciated.  - NO CHANGES to pain regimen without multiparty discussion including patient, unless true emergency.  Seen by chronic pain 1/28, recommended to continue the pain regimen as below no changes made.  - Pain management:  -- oxycodone (liquid) 20 mg q4 prn-first-line for pain  -- changed hydromorphone 1 mg IV q6prn -second line for pain  (ONLY IF inadequate relief 1 hour after oral alternative)  -- buprenorphine 20 mcg/h patch   -- hyoscyamine prn  - Nausea: discuss about transitioning one of the IV nausea medication to p.o.  -- ondansetron IV q8 scheduled  -- promethazine IV q6 PRN (per discussion with primary team prior to transfer to [facility])  -- scopolamine patch  -- olanzapine 5 mg in evening  -- no diphenhydramine IV in absence of clear indication  - Dyspepsia:  -- famotidine bid  -- PPI IV q12  - GI motility:  -- naloxegol prn  -- okay to use loperamide sparingly     Left arm PICC line site swelling, pain and ecchymosis: Left arm PICC line site noted mild swelling and mild ecchymosis from insertion.  No visible swelling noted below the elbow.  PVL confirmed acute DVT, started on anticoagulation and removed PICC line on 1/28, given acute DVT also patient is currently not on any IV antibiotic. Removed PICC line 1/28,  swelling around prior picc line site improving, mild redness/ rash  due to tape, still has bruises but resolving.   -pt has received IV benadryl prn for itching at site; try to avoid if possible     Inadequate oral intake  Abnormal gut anatomy notwithstanding, it is the consensus among hospitalist and GI services that oral and/or enteral feeding should be sufficient to meet caloric and hydration needs. Has not developed refeeding syndrome. TPN will not be offered in forseeable future.  - Consult dietitian during rehab admission  - titrate  tube feeds to ~100% USRDI = 65 mL/h: reached at 60cc/hr- tolerated well.   - Per dietitian, patient requested for cyclic feeding , started on cyclic feeding with Nutren 2.0, at goal rate 50 cc/h for 16 hours with acute 4-hour water flushes 150 cc .   - Calorie count per nutrition  - follow BMP, Mg phos q48-72, stop once at goal feeds  - GI to follow and manage wean from enteral nutrition at discharge     Elevated transaminases  Improving: Labs with AST 21, ALT 70, downtrending from prior. No evidence of cholestasis or synthetic dysfunction. Presume drug-related. No longer following closely as trend is well established.     Pouchitis (dx 03/2023), resolving  No plan for repeat endoscopy or colonoscopy during this admission, per GI, given recent exam. No significant decrement in Hb so far  - Cefdinir 300 mg daily (end = 05/05/2023)  - Completed Cefdinir 300 mg bid for 4 weeks (end = 04/07/2023)  - CBC weekly     Desmoid tumors  - okay to hold nirogesat while having difficulty with oral meds, no urgency to resume per Dr [name]     Mood / anxiety  - continue escitalopram  - lorazepam 1 mg bid prn (home medication)        Daily Checklist  - Diet: Regular diet with cyclic tube feeds  - DVT PPX: SQ enoxaparin   - GI PPX: IV protonix BID  - Access: piv    DME needs  - To be determined    DISPO: Patient to be discussed at weekly interdisciplinary team conference.   - EDD: TBD.   - Follow-up: PCP, PM&R, GI, Oncology    SUBJECTIVE:     Interval Events: NAEON. Patient resting in bed after completing first therapy session. She reports doing well. Patient requesting IV phenergan to be scheduled. Per primary team in the acute setting patient was transitioned to prn. No other concerns at this time.    OBJECTIVE:     Vital signs (last 24 hours):  Temp:  [36.6 ??C (97.9 ??F)-36.9 ??C (98.4 ??F)] 36.6 ??C (97.9 ??F)  Heart Rate:  [76-86] 76  Resp:  [17-18] 18  BP: (123-128)/(64-71) 126/64  MAP (mmHg):  [81-89] 81  SpO2:  [97 %-99 %] 97 %  BMI (Calculated):  [20.29] 20.29    Intake/Output (last 3 shifts):  I/O last 3 completed shifts:  In: 60 [NG/GT:60]  Out: 0     Physical Exam:   GEN: NAD, resting comfortably in bed  EYES: sclera anicteric, conjunctiva clear   HEENT: MMM, NG tube in place  NECK: trachea midline  CV: warm extremities, well perfused, no cyanosis   RESP:  NWOB on RA  ABD: Not distended  SKIN: no visible masses, lesions, rashes, ecchymoses.   MSK: no notable contractures or ext swelling  NEURO:Alert, MAE, speech fluid and coherent, responds appropriately to questions   PSYCH: mood euthymic, affect appropriate, thought process logical              Medications:  Scheduled    acetaminophen (TYLENOL) oral liquid Q8H SCH    ammonium lactate (LAC-HYDRIN) 12 % lotion 1 Application BID    cefdinir (OMNICEF) oral suspension Daily    cetirizine (ZYRTEC) oral syrup BID    enoxaparin (LOVENOX) syringe 50 mg Q12H SCH    escitalopram oxalate (LEXAPRO) tablet 5 mg Daily    famotidine (PEPCID) oral suspension Nightly    guar gum (NUTRISOURCE) 1 packet TID  lidocaine (ASPERCREME) 4 % 1 patch Daily    multivitamin with folic acid 400 mcg tablet 1 tablet Daily    OLANZapine zydis (ZYPREXA) disintegrating tablet 5 mg Q PM    ondansetron (ZOFRAN) injection 8 mg Q8H    pantoprazole (Protonix) injection 40 mg BID    sodium chloride (NS) 0.9 % flush 10 mL Q8H     PRN hydrocortisone, , BID PRN  hydrocortisone, , BID PRN  HYDROmorphone, 1 mg, Q6H PRN  hyoscyamine, 125 mcg, Q4H PRN  loperamide, 2 mg, QID PRN  LORazepam, 1 mg, BID PRN  melatonin, 3 mg, Nightly PRN  naloxegol, 12.5 mg, Daily PRN  oxyCODONE, 20 mg, Q4H PRN  promethazine, 12.5 mg, Q6H PRN  zinc oxide-cod liver oil, , Daily PRN          Labs/Studies: Reviewed     Radiology Results:   No results found.      Quality Indicators      Hearing, Speech, and Vision  Ability to Hear: Adequate  Ability to See in Adequate Light: Adequate  Expression of Ideas and Wants: Without difficulty  Understanding Verbal and Non-Verbal Content: Understands    Cognitive Pattern Assessment  Cognitive Pattern Assessment Used: BIMS  Brief Interview for Mental Status (BIMS)  Repetition of Three Words (First Attempt): 3  Temporal Orientation: Year: Correct  Temporal Orientation: Month: Accurate within 5 days  Temporal Orientation: Day: Correct  Recall: Sock: Yes, no cue required  Recall: Blue: Yes, no cue required  Recall: Bed: Yes, no cue required  BIMS Summary Score: 15            ADLs  Admission Current Eating      CARE Score -        CARE Score -     Oral Hygiene       CARE Score -         CARE Score -     Bladder Continence       Bowel Continence       Toileting Hygiene      CARE Score -        CARE Score -      Toilet Transfer      CARE Score -        CARE Score -     Shower/Bathe Self      CARE Score -         CARE Score -     Upper Body Dressing      CARE Score -         CARE Score -     Lower Body Dressing      CARE Score -         CARE Score -     On/Off Footwear      CARE Score -         CARE Score -           Transfers Admission Current   Bed to Chair      CARE Score -         CARE Score -       Lying to Sitting      CARE Score -         CARE Score -     Roll Left/Right      CARE Score -         CARE Score -     Sit to Lying      CARE Score -  CARE Score -     Sit to Stand      CARE Score -         CARE Score -           Mobility Admission Current   Walk 10 Feet      CARE Score -        CARE Score -       Walk 50 Feet 2 Turns      CARE Score -        CARE Score -     Walk 150 Feet      CARE Score -         CARE Score -     Walk 10 Feet Uneven      CARE Score -         CARE Score -     1 Step (Curb)      CARE Score -         CARE Score -     4 Steps      CARE Score -         CARE Score -     12 Steps      CARE Score -         CARE Score -     Picking Up Object      CARE Score -         CARE Score -     Wheelchair/Scooter Use        Wheel 50 Feet 2 Turns                               Wheel 150 Feet

## 2023-04-12 NOTE — Unmapped (Signed)
Patient is alert and oriented. Family at the bedside. Patient with NGT at left nare. Checked placement. Checked and secured. Patient with PIV at right FA. Patient with abdominal pain, due meds given. Attended all her needs. Bed on low position.           Problem: Rehabilitation ([facility]) Plan of Care  Goal: Plan of Care Review  Outcome: Progressing  Goal: Patient-Specific Goal (Individualized)  Outcome: Progressing  Goal: Absence of New-Onset Illness or Injury  Outcome: Progressing  Intervention: Prevent Fall and Fall Injury  Recent Flowsheet Documentation  Taken 04/12/2023 1400 by Enriqueta Shutter, RN  Safety Interventions: fall reduction program maintained  Taken 04/12/2023 1200 by Enriqueta Shutter, RN  Safety Interventions: fall reduction program maintained  Taken 04/12/2023 1000 by Enriqueta Shutter, RN  Safety Interventions: fall reduction program maintained  Taken 04/12/2023 0800 by Enriqueta Shutter, RN  Safety Interventions: fall reduction program maintained  Intervention: Prevent Skin Injury  Recent Flowsheet Documentation  Taken 04/12/2023 1400 by Enriqueta Shutter, RN  Device Skin Pressure Protection: absorbent pad utilized/changed  Skin Protection: adhesive use limited  Taken 04/12/2023 1200 by Enriqueta Shutter, RN  Device Skin Pressure Protection: absorbent pad utilized/changed  Skin Protection: adhesive use limited  Taken 04/12/2023 1000 by Enriqueta Shutter, RN  Device Skin Pressure Protection: absorbent pad utilized/changed  Skin Protection: adhesive use limited  Taken 04/12/2023 0800 by Enriqueta Shutter, RN  Device Skin Pressure Protection: absorbent pad utilized/changed  Skin Protection: adhesive use limited  Intervention: Prevent Infection  Recent Flowsheet Documentation  Taken 04/12/2023 1400 by Enriqueta Shutter, RN  Infection Prevention: cohorting utilized  Taken 04/12/2023 1200 by Enriqueta Shutter, RN  Infection Prevention: cohorting utilized  Taken 04/12/2023 1000 by Enriqueta Shutter, RN  Infection Prevention: cohorting utilized  Taken 04/12/2023 0800 by Enriqueta Shutter, RN  Infection Prevention: cohorting utilized  Intervention: Prevent VTE (Venous Thromboembolism)  Recent Flowsheet Documentation  Taken 04/12/2023 0800 by Wylee Ogden, Sande Brothers, RN  VTE Prevention/Management: ambulation promoted  Goal: Optimal Comfort and Wellbeing  Outcome: Progressing  Goal: Home and Community Transition Plan Established  Outcome: Progressing  Goal: Rounds/Family Conference  Outcome: Progressing     Problem: Latex Allergy  Goal: Absence of Allergy Symptoms  Outcome: Progressing     Problem: Self-Care Deficit  Goal: Improved Ability to Complete Activities of Daily Living  Outcome: Progressing     Problem: Fall Injury Risk  Goal: Absence of Fall and Fall-Related Injury  Outcome: Progressing  Intervention: Promote Injury-Free Environment  Recent Flowsheet Documentation  Taken 04/12/2023 1400 by Enriqueta Shutter, RN  Safety Interventions: fall reduction program maintained  Taken 04/12/2023 1200 by Enriqueta Shutter, RN  Safety Interventions: fall reduction program maintained  Taken 04/12/2023 1000 by Enriqueta Shutter, RN  Safety Interventions: fall reduction program maintained  Taken 04/12/2023 0800 by Enriqueta Shutter, RN  Safety Interventions: fall reduction program maintained

## 2023-04-12 NOTE — Unmapped (Addendum)
PHYSICAL THERAPY  Evaluation (04/12/23 0900)          Patient Name:  [name] [name]       Medical Record Number: [id number]   Date of Birth: Nov 28, 2000  Gender: Female            Post-Discharge Physical Therapy Recommendations     Post Discharge Recommendations: Outpatient     PT DME Recommendations:  (pt has all needed equipment)     Barriers to Discharge: Home environment barriers, Mobility deficits, Self-Care deficits    Assessment     Treatment Diagnosis: impaired functional mobility, weakness.      Assessment : [name] [name] is a 23 y.o. female 23 y.o. female with PMH of [name] s/p proctocolectomy and J-pouch, desmoid tumours, chronic abdominal pain and nausea, anxiety, PTSD related to past medical care, DVT RUE s/p Eliquis x3 months admitted for constellation of worsening pain, intolerance of oral intake. She is now admitted to [facility] for comprehensive interdisciplinary rehabilitation. Upon PT eval, pt presenting with impaired functional strength, co-contrationg with attempts at AROM in BLE, UE weakness, impaired endurance and pain. She requires mod A for bed mobility for B LEs into and out of bed, CGA for STS and stand pivot transfers with and without RW pushing up with B UEs from surface and amb up to 30' with RW with CG-min A. She will benefit from skilled PT to address functional mobility and endurance deficits.     Problem List: Decreased strength, Impaired balance, Pain, Decreased endurance, Decreased mobility, Fall risk       Personal Factors/Comorbidities Present: 3+   Specific Comorbidities : [name] s/p proctocolectomy and J-pouch, desmoid tumours, chronic abdominal pain and nausea, anxiety, PTSD related to past medical care, DVT chronic RUE s/p Eliquis x3 months, NEW L DVT   Examination of Body System: Musculoskeletal, Cardiovascular, Neurological, Integumentary, Activity/participation   Clinical Presentation: Evolving     Clinical Decision Making: High    Education: re-orientationt to rehab, education about PT goals and POC and expected progress; HEP: 1 min of pushing wheelchair 2x a day in addition to therapy to start improving functional endurance.  Pt reporting that she has been having issues with the alignment of her chair, Jason from [place] has been to adjust it, but reporting it may need more adjustments.        Subjective     Prognosis: Good    Patient at end of session: All needs in reach, In bed, Friends/Family present    Positive Indicators: CLOF, age, motivation, family support    Prior Level of Function and Home Situation    Prior Functional Status: prior to hosptial admission in Dec, patient was doing very well she was going out in the community in her custom wheelchiar, was walking with her rollator with I. HHPT never came following last AIR admission, but she started OPPT a couple months later. She was walking via furtniture surfing at times but having frequent falls. She was doing stairs 1x a day to her bed room with supervision/SBA from family; she was then admitted in dec with weakness, weightloss and functional decline; went home for 4-5 days and then returned for this admission with continued weakness.     Services patient receives prior to admission: PT (outpateint 2x/week)      Patient reports: she is feeling frustrated about her loss of function. I was doing so well     Living Situation  Living Environment: House  Lives With:  Father, Mother  Home Living: Multi-level home, 1/2 bath on main level, Bed/bath upstairs, Stairs to alternate level with rails  Number of Stairs to Enter (outside): 2 (TBD if they have a rail)  Rail placement (inside): Rail on right side  Number of Stairs to Alternate level (inside): 14  Caregiver Identified?: Yes  Caregiver Availability: 24 hours  Caregiver Ability: Limited lifting     Equipment available at home: Rollator, Wheelchair-manual, Rolling walker     Vitals/Orthostatics : 117/68 (81) 85 seated; 110/75 (83) hr 92 in standing; Chronic DVT in R arm, new DVT in L arm (RN ok with minimal reading on R arm, may need further clarification from MD for future).      Past Medical History:   Diagnosis Date    Abdominal pain     Acid reflux     occas    Anesthesia complication     itching, shaking, coldness; last few surgeries have gone much better    Cancer (CMS-HCC)     Cataract of right eye     COVID-19 virus infection 01/2019    Cyst of thyroid determined by ultrasound     monitoring    [name]     2 right forearm, 1 left thigh, 1 right scapula, 1 under left clavicle; multiple    Difficult intravenous access     [name] (familial adenomatous polyposis)     [name]     Gastric polyps     History of chemotherapy     last treatment approx 05/2019    History of colon polyps     History of COVID-19 01/2019    Ileus (CMS-HCC) 03/16/2022    Iron deficiency anemia due to chronic blood loss     received iron infusion 11-2019    PONV (postoperative nausea and vomiting)     Rectal bleeding     Syncopal episodes     especially if becoming dehydrated            Social History     Tobacco Use    Smoking status: Never     Passive exposure: Past    Smokeless tobacco: Never   Substance Use Topics    Alcohol use: Never       Past Surgical History:   Procedure Laterality Date    COLON SURGERY      cyroablation      cystis removal      desmoid removal      PR CLOSE ENTEROSTOMY,RESEC+ANAST N/A 10/09/2020    Procedure: ILEOSTOMY TAKEDOWN;  Surgeon: Mickle Asper, MD;  Location: OR [place];  Service: General Surgery    PR COLONOSCOPY W/BIOPSY SINGLE/MULTIPLE N/A 10/27/2012    Procedure: COLONOSCOPY, FLEXIBLE, PROXIMAL TO SPLENIC FLEXURE; WITH BIOPSY, SINGLE OR MULTIPLE;  Surgeon: Shirlyn Goltz Mir, MD;  Location: PEDS PROCEDURE ROOM [place];  Service: Gastroenterology    PR COLONOSCOPY W/BIOPSY SINGLE/MULTIPLE N/A 09/14/2013    Procedure: COLONOSCOPY, FLEXIBLE, PROXIMAL TO SPLENIC FLEXURE; WITH BIOPSY, SINGLE OR MULTIPLE;  Surgeon: Shirlyn Goltz Mir, MD;  Location: PEDS PROCEDURE ROOM [place];  Service: Gastroenterology    PR COLONOSCOPY W/BIOPSY SINGLE/MULTIPLE N/A 11/08/2014    Procedure: COLONOSCOPY, FLEXIBLE, PROXIMAL TO SPLENIC FLEXURE; WITH BIOPSY, SINGLE OR MULTIPLE;  Surgeon: Arnold Long Mir, MD;  Location: PEDS PROCEDURE ROOM [facility];  Service: Gastroenterology    PR COLONOSCOPY W/BIOPSY SINGLE/MULTIPLE N/A 12/26/2015    Procedure: COLONOSCOPY, FLEXIBLE, PROXIMAL TO SPLENIC FLEXURE; WITH BIOPSY, SINGLE OR MULTIPLE;  Surgeon: Arnold Long Mir, MD;  Location:  PEDS PROCEDURE ROOM [facility];  Service: Gastroenterology    PR COLONOSCOPY W/BIOPSY SINGLE/MULTIPLE N/A 09/02/2017    Procedure: COLONOSCOPY, FLEXIBLE, PROXIMAL TO SPLENIC FLEXURE; WITH BIOPSY, SINGLE OR MULTIPLE;  Surgeon: Arnold Long Mir, MD;  Location: PEDS PROCEDURE ROOM [facility];  Service: Gastroenterology    PR COLSC FLX W/REMOVAL LESION BY HOT BX FORCEPS N/A 08/27/2016    Procedure: COLONOSCOPY, FLEXIBLE, PROXIMAL TO SPLENIC FLEXURE; W/REMOVAL TUMOR/POLYP/OTHER LESION, HOT BX FORCEP/CAUTE;  Surgeon: Arnold Long Mir, MD;  Location: PEDS PROCEDURE ROOM [facility];  Service: Gastroenterology    PR COLSC FLX W/RMVL OF TUMOR POLYP LESION SNARE TQ N/A 02/25/2019    Procedure: COLONOSCOPY FLEX; W/REMOV TUMOR/LES BY SNARE;  Surgeon: Helyn Numbers, MD;  Location: GI PROCEDURES [place] [facility];  Service: Gastroenterology    PR COLSC FLX W/RMVL OF TUMOR POLYP LESION SNARE TQ N/A 03/13/2020    Procedure: COLONOSCOPY FLEX; W/REMOV TUMOR/LES BY SNARE;  Surgeon: Helyn Numbers, MD;  Location: [facility] [facility];  Service: Gastroenterology    PR EXC SKIN BENIG 2.1-3 CM TRUNK,ARM,LEG Right 02/25/2017    Procedure: EXCISION, BENIGN LESION INCLUDE MARGINS, EXCEPT SKIN TAG, LEGS; EXCISED DIAMETER 2.1 TO 3.0 CM;  Surgeon: Clarene Duke, MD;  Location: CHILDRENS OR [facility];  Service: Plastics    PR EXC SKIN BENIG 3.1-4 CM TRUNK,ARM,LEG Right 02/25/2017    Procedure: EXCISION, BENIGN LESION INCLUDE MARGINS, EXCEPT SKIN TAG, ARMS; EXCISED DIAMETER 3.1 TO 4.0 CM;  Surgeon: Clarene Duke, MD;  Location: CHILDRENS OR [facility];  Service: Plastics    PR EXC SKIN BENIG >4 CM FACE,FACIAL Right 02/25/2017    Procedure: EXCISION, OTHER BENIGN LES INCLUD MARGIN, FACE/EARS/EYELIDS/NOSE/LIPS/MUCOUS MEMBRANE; EXCISED DIAM >4.0 CM;  Surgeon: Clarene Duke, MD;  Location: CHILDRENS OR [facility];  Service: Plastics    PR EXC TUMOR SOFT TISSUE LEG/ANKLE SUBQ 3+CM Right 08/05/2019    Procedure: EXCISION, TUMOR, SOFT TISSUE OF LEG OR ANKLE AREA, SUBCUTANEOUS; 3 CM OR GREATER;  Surgeon: Arsenio Katz, MD;  Location: MAIN OR [place];  Service: Plastics    PR EXC TUMOR SOFT TISSUE LEG/ANKLE SUBQ <3CM Right 08/05/2019    Procedure: EXCISION, TUMOR, SOFT TISSUE OF LEG OR ANKLE AREA, SUBCUTANEOUS; LESS THAN 3 CM;  Surgeon: Arsenio Katz, MD;  Location: MAIN OR [facility];  Service: Plastics    PR LAP, SURG PROCTECTOMY W J-POUCH N/A 08/10/2020    Procedure: ROBOTIC ASSISTED LAPAROSCOPIC PROCTOCOLECTOMY, ILEAL J POUCH, WITH OSTOMY;  Surgeon: Mickle Asper, MD;  Location: OR [place];  Service: General Surgery    PR NDSC EVAL INTSTINAL POUCH DX W/COLLJ SPEC SPX N/A 01/23/2021    Procedure: ENDO EVAL SM INTEST POUCH; DX;  Surgeon: Modena Nunnery, MD;  [facility]: [facility]ED[facility] [place] [facility];  Service: Gastroenterology    PR NDSC EVAL INTSTINAL POUCH DX W/COLLJ SPEC SPX N/A 08/27/2021    Procedure: ENDO EVAL SM INTEST POUCH; DX;  Surgeon: Hunt Oris, MD;  [facility];  Service: Gastroenterology    PR NDSC EVAL INTSTINAL POUCH DX W/COLLJ SPEC SPX N/A 12/09/2021    Procedure: ENDO EVAL SM INTEST POUCH; DX;  Surgeon: Vidal Schwalbe, MD;  Location: GI PROCEDURES [facility];  Service: Gastroenterology    PR NDSC EVAL INTSTINAL POUCH DX W/COLLJ [facility] SPX Left 04/09/2022    Procedure: ENDO EVAL SM INTEST POUCH; DX;  Surgeon: Modena Nunnery, MD;  [facility] [place] [facility];  Service: Gastroenterology    PR NDSC EVAL INTSTINAL POUCH DX W/COLLJ SPEC SPX N/A  08/05/2022    Procedure: ENDO EVAL SM INTEST POUCH; DX;  Surgeon: Modena Nunnery, MD;  Location: GI PROCEDURES [facility];  Service: Gastroenterology    PR NDSC EVAL INTSTINAL POUCH DX W/COLLJ SPEC SPX N/A 03/13/2023    Procedure: ENDO EVAL SM INTEST POUCH; DX;  Surgeon: Carmon Ginsberg, MD;  Location: GI PROCEDURES [facility];  Service: Gastroenterology    PR NDSC EVAL INTSTINAL POUCH W/BX SINGLE/MULTIPLE N/A 01/20/2022    Procedure: ENDOSCOPIC EVAL OF SMALL INTESTINAL POUCH; DIAGNOSTIC, No biopsies;  Surgeon: Andrey Farmer, MD;  Location: GI PROCEDURES [facility];  Service: Gastroenterology    PR NDSC EVAL INTSTINAL POUCH W/BX SINGLE/MULTIPLE N/A 02/13/2022    Procedure: ENDOSCOPIC EVAL OF SMALL INTESTINAL POUCH; DIAGNOSTIC, WITH BIOPSY;  Surgeon: Bronson Curb, MD;  Location: GI PROCEDURES [facility];  Service: Gastroenterology    PR NDSC EVAL INTSTINAL POUCH W/BX SINGLE/MULTIPLE N/A 03/13/2023    Procedure: ENDOSCOPIC EVAL OF SMALL INTESTINAL POUCH; DIAGNOSTIC, WITH BIOPSY;  Surgeon: Carmon Ginsberg, MD;  Location: GI PROCEDURES [facility];  Service: Gastroenterology    PR UNLISTED PROCEDURE SMALL INTESTINE  01/23/2021    Procedure: UNLISTED PROCEDURE, SMALL INTESTINE;  Surgeon: Modena Nunnery, MD;  Location: [facility]URES [place] [facility];  Service: Gastroenterology    PR UNLISTED PROCEDURE SMALL INTESTINE  02/13/2022    Procedure: UNLISTED PROCEDURE, SMALL INTESTINE;  Surgeon: Bronson Curb, MD;  Location: GI PROCEDURES [facility];  Service: Gastroenterology    PR UPPER GI ENDOSCOPY,BIOPSY N/A 10/27/2012    Procedure: UGI ENDOSCOPY; WITH BIOPSY, SINGLE OR MULTIPLE;  Surgeon: Shirlyn Goltz Mir, MD;  Location: PEDS PROCEDURE ROOM [facility];  Service: Gastroenterology    PR UPPER GI ENDOSCOPY,BIOPSY N/A 09/14/2013    Procedure: UGI ENDOSCOPY; WITH BIOPSY, SINGLE OR MULTIPLE;  Surgeon: Shirlyn Goltz Mir, MD;  Location: PEDS PROCEDURE ROOM [facility];  Service: Gastroenterology    PR UPPER GI ENDOSCOPY,BIOPSY N/A 11/08/2014    Procedure: UGI ENDOSCOPY; WITH BIOPSY, SINGLE OR MULTIPLE;  Surgeon: Arnold Long Mir, MD;  Location: PEDS PROCEDURE ROOM [facility];  Service: Gastroenterology    PR UPPER GI ENDOSCOPY,BIOPSY N/A 12/26/2015    Procedure: UGI ENDOSCOPY; WITH BIOPSY, SINGLE OR MULTIPLE;  Surgeon: Arnold Long Mir, MD;  Location: PEDS PROCEDURE ROOM [facility];  Service: Gastroenterology    PR UPPER GI ENDOSCOPY,BIOPSY N/A 08/27/2016    Procedure: UGI ENDOSCOPY; WITH BIOPSY, SINGLE OR MULTIPLE;  Surgeon: Arnold Long Mir, MD;  Location: PEDS PROCEDURE ROOM [facility];  Service: Gastroenterology    PR UPPER GI ENDOSCOPY,BIOPSY N/A 09/02/2017    Procedure: UGI ENDOSCOPY; WITH BIOPSY, SINGLE OR MULTIPLE;  Surgeon: Arnold Long Mir, MD;  Location: PEDS PROCEDURE ROOM [facility];  Service: Gastroenterology    PR UPPER GI ENDOSCOPY,BIOPSY N/A 03/13/2020    Procedure: UGI ENDOSCOPY; WITH BIOPSY, SINGLE OR MULTIPLE;  Surgeon: Helyn Numbers, MD;  Location: [facility] PROCED[facility] [place] [facility];  Service: Gastroenterology    PR UPPER GI ENDOSCOPY,BIOPSY N/A 09/05/2021    Procedure: UGI ENDOSCOPY; WITH BIOPSY, SINGLE OR MULTIPLE;  Surgeon: Wendall Papa, MD;  Location: GI PROCEDURES [facility];  Service: Gastroenterology    PR UPPER GI ENDOSCOPY,DIAGNOSIS N/A 01/20/2022    Procedure: UGI ENDO, INCLUDE ESOPHAGUS, STOMACH, & DUODENUM &/OR JEJUNUM; DX W/WO COLLECTION SPECIMN, BY BRUSH OR WASH;  Surgeon: Andrey Farmer, MD;  Location: GI PROCEDURES [facility];  Service: Gastroenterology    TUMOR REMOVAL      multiple-head, neck, back, hand, right flank, multiple  Family History   Problem Relation Age of Onset    No Known Problems Mother     No Known Problems Father     No Known Problems Sister     No Known Problems Brother     Stroke Maternal Grandmother     Other Maternal Grandmother         benign lesions of liver and pancreas, further details unknown    Cancer Maternal Grandmother     Diabetes Maternal Grandmother     Hypertension Maternal Grandmother     Thyroid disease Maternal Grandmother     Arthritis Maternal Grandfather     Asthma Maternal Grandfather     COPD Paternal Grandmother         Deceased    Miscarriages / Stillbirths Paternal Grandmother     Alcohol abuse Paternal Grandfather         Deceased    No Known Problems Maternal Aunt     No Known Problems Maternal Uncle     No Known Problems Paternal Aunt     No Known Problems Paternal Uncle     Anesthesia problems Neg Hx     Broken bones Neg Hx     Cancer Neg Hx     Clotting disorder Neg Hx     Collagen disease Neg Hx     Diabetes Neg Hx     Dislocations Neg Hx     Fibromyalgia Neg Hx     Gout Neg Hx     Hemophilia Neg Hx     Osteoporosis Neg Hx     Rheumatologic disease Neg Hx     Scoliosis Neg Hx     Severe sprains Neg Hx     Sickle cell anemia Neg Hx     Spinal Compression Fracture Neg Hx     Melanoma Neg Hx     Basal cell carcinoma Neg Hx     Squamous cell carcinoma Neg Hx         Allergies: Adhesive tape-silicones; Ferrlecit [sodium ferric gluconat-sucrose]; Levofloxacin; Methylnaltrexone; Neomycin; Papaya; Morphine; Zosyn [piperacillin-tazobactam]; Compazine [prochlorperazine]; Iron analogues; Reglan [metoclopramide hcl]; Iron dextran; and Latex, natural rubber      Objective Findings     Activity Tolerance: Tolerated treatment well     Precautions / Restrictions  Precautions: Falls precautions           Pain Comments: c/o chronic pain in R shoulder and back      Cognition: WFL  Orientation: Oriented x4     Visual/Perception: Within Functional Limits      Skin Inspection: Intact where visualized      Upper Extremities  UE ROM: Right Impaired/Limited, Left Impaired/Limited  UE Strength: Right Impaired/Limited, Left Impaired/Limited  UE comment: Painful AROM, able to lift against gravity to 90 deg, weak grip (R weaker than L)       Lower Extremities  LE ROM: Right Impaired/Limited, Left Impaired/Limited  RLE ROM Impairment: Limited AROM, Limited PROM  LLE ROM Impairment: Limited AROM, Limited PROM  LE Strength: Right Impaired/Limited, Left Impaired/Limited  RLE Strength Impairment: Reduced strength  LLE Strength Impairment: Reduced strength  LE comment: Noted signficant co contraction with attempt at AROM; 1/5 hip flexion with formal testing but able to lift feet on to/off foot plates, knee extension 2-/5, ankle DF 2/5 B   Pt c/o discomfort with knee extension, 2/2 hamstring tightness.       Coordination: WFL  Proprioception: Not tested  Sensation: Tingling (intermittent tingling in feet  with certain sitting positions)  Posture: Rounded shoulders  Motor/Sensory/Neuro Comments: B foot numbness; (+) L slump test.  Balance: Impaired  Balance comment row: I sitting balance; CGA standing balance with UE A, CGA standing balance without UE A statically, increased knee trembling/knee flexion with attempts to stand without UE  A.       Endurance: fair       Goals:   Patient and Family Goals: to get back to the way she was    Roll Left to Right  Discharge Goal: Independent  Assistance Needed: Physical assistance  Physical Assistance Level: 25% or less  Comment: min A without rail, flat bed  CARE Score - Roll Left and Right: 3    Sit to Lying  Discharge Goal: Independent  Assistance Needed: Physical assistance  Physical Assistance Level: 26%-50%  Comment: total A for B LEs, able to control trunk.  CARE Score - Sit to Lying: 3    Lying to Sitting  Discharge Goal: Independent  Assistance Needed: Physical assistance  Physical Assistance Level: 26%-50%  Comment: mod A with combined assistance for B LE of bed and min A for trunk. Requires cues to strategy.  CARE Score - Lying to Sitting on Side of Bed: 3       Sit to Stand  Discharge Goal: Independent  Assistance Needed: Incidental touching  Comment: CGA without AD, CGA with RW.  CARE Score - Sit to Stand: 4    Chair to Chair/Bed  Discharge Goal: Independent  Assistance Needed: Incidental touching  Comment: CGA scoot transfer, Stand step with RW and without AD wtih CGA.  CARE Score - Chair/Bed-to-Chair Transfer: 4       Car Transfer  Discharge Goal: Independent  Reason if not Attempted: Environmental limitations  CARE Score - Car Transfer: 10     Pick Up Objects  Discharge Goal: Supervision or touching assistance  Reason if not Attempted: Environmental limitations  CARE Score - Picking Up Object: 10       Walk 10 Feet   Discharge Goal: Independent  Assistance Needed: Adaptive equipment, Physical assistance  Physical Assistance Level: 25% or less  Comment: CG-min A with RW x 30' with crouched posture, decreased foot clearance, intermittent trembling requires increased encouragement to achieve full 30', no buckling.  CARE Score - Walk 10 Feet: 3    Walk 50 Feet with Turns  Discharge Goal: Supervision or touching assistance  Assistance Needed: Physical assistance  Physical Assistance Level: 51%-75%  Comment: min A 30' with RW  CARE Score - Walk 50 Feet with Two Turns: 2    Walk 150 Feet  Discharge Goal: Supervision or touching assistance  Reason if not Attempted: Safety concerns  CARE Score - Walk 150 Feet: 88    Walk 10 Feet on Uneven Surface  Discharge Goal: Supervision or touching assistance  Reason if not Attempted: Safety concerns  CARE Score - Walking 10 Feet on Uneven Surfaces: 88       1 Step (Curb)  Discharge Goal: Supervision or touching assistance  Reason if not Attempted: Safety concerns  CARE Score - 1 Step (Curb): 88    4 Steps  Discharge Goal: Supervision or touching assistance  Reason if not Attempted: Safety concerns  CARE Score - 4 Steps: 88    12 Steps  Discharge Goal: Supervision or touching assistance  Reason if not Attempted: Safety concerns  CARE Score - 12 Steps: 88       Wheel 50 Feet with Two Turns  Discharge Goal: Independent  Assistance Needed: Independent  Comment: 72' w/c propulsion with I, decreased speed 6/10 RPE  CARE Score - Wheel 50 Feet with Two Turns: 6  Type of Wheelchair/Scooter: Manual    Wheel 150 Feet  Discharge Goal: Independent  Assistance Needed: Physical assistance  Physical Assistance Level: 26%-50%  CARE Score - Wheel 150 Feet: 3  Type of Wheelchair/Scooter: Manual       Short and Long Term Goals  SHORT GOAL #1: Pt will complete 6 min walk test prior to discharge.  Long Term Goal #1: Pt will perform household mobility with LRAD and  mod I.        Plan     Follow-up / Frequency  Minutes per day: 1-2 hours/day  Days per week: 5-7     Planned Interventions: Balance activities, Diaphragmatic / Pursed-lip breathing, Education - Patient, Education - Family / caregiver, Endurance activities, Functional mobility, Gait training, Home exercise program, Movement facilitation, Neuromuscular re-education, Passive range of motion, Postural re-education, Self-care / Home training, Stair training, Therapeutic exercise, Therapeutic activity, Transfer training, Wheelchair training     PT Individual [mins]: 60      I attest that I have reviewed the above information.  Signed: Jonelle Sidle Addeline Calarco, PT  Ceasar Mons 04/12/2023

## 2023-04-13 LAB — BASIC METABOLIC PANEL
ANION GAP: 13 mmol/L (ref 5–14)
BLOOD UREA NITROGEN: 13 mg/dL (ref 9–23)
BUN / CREAT RATIO: 23
CALCIUM: 9.8 mg/dL (ref 8.7–10.4)
CHLORIDE: 100 mmol/L (ref 98–107)
CO2: 28.8 mmol/L (ref 20.0–31.0)
CREATININE: 0.57 mg/dL (ref 0.55–1.02)
EGFR CKD-EPI (2021) FEMALE: >90 mL/min/{1.73_m2} (ref >=60)
GLUCOSE RANDOM: 133 mg/dL (ref 70–179)
POTASSIUM: 4.4 mmol/L (ref 3.4–4.8)
SODIUM: 142 mmol/L (ref 135–145)

## 2023-04-13 LAB — CBC
HEMATOCRIT: 35.5 % (ref 34.0–44.0)
HEMOGLOBIN: 11.7 g/dL (ref 11.3–14.9)
MEAN CORPUSCULAR HEMOGLOBIN CONC: 32.9 g/dL (ref 32.0–36.0)
MEAN CORPUSCULAR HEMOGLOBIN: 27.8 pg (ref 25.9–32.4)
MEAN CORPUSCULAR VOLUME: 84.6 fL (ref 77.6–95.7)
MEAN PLATELET VOLUME: 10.3 fL (ref 6.8–10.7)
PLATELET COUNT: 214 10*9/L (ref 150–450)
RED BLOOD CELL COUNT: 4.2 10*12/L (ref 3.95–5.13)
RED CELL DISTRIBUTION WIDTH: 17.5 % — ABNORMAL HIGH (ref 12.2–15.2)
WBC ADJUSTED: 6.8 10*9/L (ref 3.6–11.2)

## 2023-04-13 LAB — PHOSPHORUS: PHOSPHORUS: 5.6 mg/dL — ABNORMAL HIGH (ref 2.4–5.1)

## 2023-04-13 LAB — MAGNESIUM: MAGNESIUM: 2 mg/dL (ref 1.6–2.6)

## 2023-04-13 MED ADMIN — promethazine (PHENERGAN) 12.5 mg in sodium chloride 0.9 % 50 mL IVPB (premix): 12.5 mg | INTRAVENOUS | @ 17:00:00

## 2023-04-13 MED ADMIN — sodium chloride (NS) 0.9 % flush 10 mL: 10 mL | INTRAVENOUS | @ 11:00:00

## 2023-04-13 MED ADMIN — acetaminophen (TYLENOL) oral liquid: 1000 mg | ENTERAL | @ 04:00:00

## 2023-04-13 MED ADMIN — ondansetron (ZOFRAN) injection 8 mg: 8 mg | INTRAVENOUS | @ 04:00:00

## 2023-04-13 MED ADMIN — ammonium lactate (LAC-HYDRIN) 12 % lotion 1 Application: 1 | TOPICAL | @ 04:00:00

## 2023-04-13 MED ADMIN — oxyCODONE (ROXICODONE) 5 mg/5 mL solution 20 mg: 20 mg | ORAL | @ 01:00:00 | Stop: 2023-04-19

## 2023-04-13 MED ADMIN — HYDROmorphone (PF) (DILAUDID) injection 1 mg: 1 mg | INTRAVENOUS | @ 17:00:00 | Stop: 2023-04-17

## 2023-04-13 MED ADMIN — guar gum (NUTRISOURCE) 1 packet: 1 | ENTERAL | @ 15:00:00

## 2023-04-13 MED ADMIN — ondansetron (ZOFRAN) injection 8 mg: 8 mg | INTRAVENOUS | @ 11:00:00 | Stop: 2023-04-13

## 2023-04-13 MED ADMIN — acetaminophen (TYLENOL) oral liquid: 1000 mg | ENTERAL | @ 20:00:00

## 2023-04-13 MED ADMIN — acetaminophen (TYLENOL) oral liquid: 1000 mg | ENTERAL | @ 11:00:00

## 2023-04-13 MED ADMIN — sodium chloride (NS) 0.9 % flush 10 mL: 10 mL | INTRAVENOUS | @ 20:00:00

## 2023-04-13 MED ADMIN — escitalopram oxalate (LEXAPRO) tablet 5 mg: 5 mg | ENTERAL | @ 15:00:00

## 2023-04-13 MED ADMIN — oxyCODONE (ROXICODONE) 5 mg/5 mL solution 20 mg: 20 mg | ORAL | @ 20:00:00 | Stop: 2023-04-19

## 2023-04-13 MED ADMIN — oxyCODONE (ROXICODONE) 5 mg/5 mL solution 20 mg: 20 mg | ORAL | @ 13:00:00 | Stop: 2023-04-19

## 2023-04-13 MED ADMIN — enoxaparin (LOVENOX) syringe 50 mg: 1 mg/kg | SUBCUTANEOUS | @ 04:00:00

## 2023-04-13 MED ADMIN — enoxaparin (LOVENOX) syringe 50 mg: 1 mg/kg | SUBCUTANEOUS | @ 15:00:00

## 2023-04-13 MED ADMIN — sodium chloride (NS) 0.9 % flush 10 mL: 10 mL | INTRAVENOUS | @ 02:00:00

## 2023-04-13 MED ADMIN — guar gum (NUTRISOURCE) 1 packet: 1 | ENTERAL | @ 02:00:00

## 2023-04-13 MED ADMIN — ammonium lactate (LAC-HYDRIN) 12 % lotion 1 Application: 1 | TOPICAL | @ 15:00:00

## 2023-04-13 MED ADMIN — HYDROmorphone (PF) (DILAUDID) injection 1 mg: 1 mg | INTRAVENOUS | @ 02:00:00 | Stop: 2023-04-17

## 2023-04-13 MED ADMIN — multivitamin with folic acid 400 mcg tablet 1 tablet: 1 | ORAL | @ 15:00:00

## 2023-04-13 MED ADMIN — promethazine (PHENERGAN) 12.5 mg in sodium chloride 0.9 % 50 mL IVPB (premix): 12.5 mg | INTRAVENOUS | @ 23:00:00

## 2023-04-13 MED ADMIN — HYDROmorphone (PF) (DILAUDID) injection 1 mg: 1 mg | INTRAVENOUS | @ 23:00:00 | Stop: 2023-04-17

## 2023-04-13 MED ADMIN — lidocaine (ASPERCREME) 4 % 1 patch: 1 | TRANSDERMAL | @ 15:00:00

## 2023-04-13 MED ADMIN — oxyCODONE (ROXICODONE) 5 mg/5 mL solution 20 mg: 20 mg | ORAL | @ 05:00:00 | Stop: 2023-04-19

## 2023-04-13 MED ADMIN — scopolamine (TRANSDERM-SCOP) 1 mg over 3 days topical patch 1 mg: 1 | TOPICAL | @ 15:00:00

## 2023-04-13 MED ADMIN — ondansetron (ZOFRAN) injection 8 mg: 8 mg | INTRAVENOUS | @ 20:00:00

## 2023-04-13 MED ADMIN — famotidine (PEPCID) oral suspension: 20 mg | ENTERAL | @ 02:00:00

## 2023-04-13 MED ADMIN — pantoprazole (Protonix) injection 40 mg: 40 mg | INTRAVENOUS | @ 02:00:00

## 2023-04-13 MED ADMIN — promethazine (PHENERGAN) 12.5 mg in sodium chloride 0.9 % 50 mL IVPB (premix): 12.5 mg | INTRAVENOUS | @ 05:00:00 | Stop: 2023-04-13

## 2023-04-13 MED ADMIN — guar gum (NUTRISOURCE) 1 packet: 1 | ENTERAL | @ 20:00:00

## 2023-04-13 MED ADMIN — pantoprazole (Protonix) injection 40 mg: 40 mg | INTRAVENOUS | @ 15:00:00

## 2023-04-13 MED ADMIN — HYDROmorphone (PF) (DILAUDID) injection 1 mg: 1 mg | INTRAVENOUS | @ 11:00:00 | Stop: 2023-04-17

## 2023-04-13 MED ADMIN — cefdinir (OMNICEF) oral suspension: 300 mg | ORAL | @ 15:00:00 | Stop: 2023-05-06

## 2023-04-13 NOTE — Unmapped (Signed)
Care Management  Initial Transition Planning Assessment   Per H and P-  [name] [name] is a 23 y.o. female with PMH of [name] s/p proctocolectomy and J-pouch, desmoid tumours, chronic abdominal pain and nausea, anxiety, PTSD related to past medical care, DVT RUE s/p Eliquis x3 months admitted for constellation of worsening pain, intolerance of oral intake. She is now admitted to [facility] for comprehensive interdisciplinary rehabilitation.      Rehab Impairment Group Code [facility]):   (Debility) 16 Debility (Non-Cardiac/Non-Pulmonary)   Etiology: worsening pain, intolerance of oral intake     Pre-Team Conference Note: Discharge Date is set for TBD, plan to discuss in team tomorrow. CM met with patient and her mother to complete initial assessment.  Patient's discharge address and phone number verified as correct in demographics. Patient lives in a 3 story home with 3 stairs to enter and has a threshold step, her bedroom is on the second floor.  Pt lives with her parents who is able to provide 24/7 supervision at home.  Patient has the following DME in place at home, Tube feeds, Walker, Rollator, BSC. TTB Patient is currently  open to [facility] agency.   Mother is this patient's Insurance risk surveyor and will be picked up by mother at discharge.  She has a PCP and BCBS.    Care Manager provided patient and/or family with a [place] My Chart Brochure and review how to establish an account. CM will follow for discharge planning needs.       Type of Residence: Mailing Address:  [address] [place] [address]  Contacts: Extended Emergency Contact Information  Primary Emergency Contact: Jule Ser [place]  Home Phone: [phone]  Mobile Phone: [phone]  Relation: Mother  Secondary Emergency Contact: Hopes,Jeff  Mobile Phone: [phone]  Relation: Father    Patient Phone Number: [phone] (home) [phone] (work)          Medical Provider(s): Jarold Motto, [facility]  Reason for Admission: Admitting Diagnosis:  Weakness  Past Medical History:   has a past medical history of Abdominal pain, Acid reflux, Anesthesia complication, Cancer (CMS-HCC), Cataract of right eye, COVID-19 virus infection (01/2019), Cyst of thyroid determined by ultrasound, [name], Difficult intravenous access, [name] (familial adenomatous polyposis), [name], Gastric polyps, History of chemotherapy, History of colon polyps, History of COVID-19 (01/2019), Ileus (CMS-HCC) (03/16/2022), Iron deficiency anemia due to chronic blood loss, PONV (postoperative nausea and vomiting), Rectal bleeding, and Syncopal episodes.  Past Surgical History:   has a past surgical history that includes desmoid removal; cystis removal; Tumor removal; pr upper gi endoscopy,biopsy (N/A, 10/27/2012); pr colonoscopy w/biopsy single/multiple (N/A, 10/27/2012); pr upper gi endoscopy,biopsy (N/A, 09/14/2013); pr colonoscopy w/biopsy single/multiple (N/A, 09/14/2013); pr upper gi endoscopy,biopsy (N/A, 11/08/2014); pr colonoscopy w/biopsy single/multiple (N/A, 11/08/2014); pr upper gi endoscopy,biopsy (N/A, 12/26/2015); pr colonoscopy w/biopsy single/multiple (N/A, 12/26/2015); pr upper gi endoscopy,biopsy (N/A, 08/27/2016); pr colsc flx w/removal lesion by hot bx forceps (N/A, 08/27/2016); pr exc skin benig >4 cm face,facial (Right, 02/25/2017); pr exc skin benig 3.1-4 cm trunk,arm,leg (Right, 02/25/2017); pr exc skin benig 2.1-3 cm trunk,arm,leg (Right, 02/25/2017); pr upper gi endoscopy,biopsy (N/A, 09/02/2017); pr colonoscopy w/biopsy single/multiple (N/A, 09/02/2017); pr colsc flx w/rmvl of tumor polyp lesion snare tq (N/A, 02/25/2019); pr exc tumor soft tissue leg/ankle subq 3+cm (Right, 08/05/2019); pr exc tumor soft tissue leg/ankle subq <3cm (Right, 08/05/2019); pr colsc flx w/rmvl of tumor polyp lesion snare tq (N/A, 03/13/2020); pr upper gi endoscopy,biopsy (  N/A, 03/13/2020); cyroablation; pr lap, surg proctectomy w j-pouch (N/A, 08/10/2020); pr close enterostomy,resec+anast (N/A, 10/09/2020); pr ndsc eval intstinal pouch dx w/collj spec spx (N/A, 01/23/2021); pr unlisted procedure small intestine (01/23/2021); pr ndsc eval intstinal pouch dx w/collj spec spx (N/A, 08/27/2021); pr upper gi endoscopy,biopsy (N/A, 09/05/2021); pr ndsc eval intstinal pouch dx w/collj spec spx (N/A, 12/09/2021); pr upper gi endoscopy,diagnosis (N/A, 01/20/2022); pr ndsc eval intstinal pouch w/bx single/multiple (N/A, 01/20/2022); pr ndsc eval intstinal pouch w/bx single/multiple (N/A, 02/13/2022); pr unlisted procedure small intestine (02/13/2022); pr ndsc eval intstinal pouch dx w/collj spec spx (Left, 04/09/2022); Colon surgery; pr ndsc eval intstinal pouch dx w/collj spec spx (N/A, 08/05/2022); pr ndsc eval intstinal pouch dx w/collj spec spx (N/A, 03/13/2023); and pr ndsc eval intstinal pouch w/bx single/multiple (N/A, 03/13/2023).   Previous admit date: 03/27/2023    Primary Insurance- Payor: BCBS / Plan: BCBS BLUE OPTIONS/PPO/ADV ([place] ONLY) / Product Type: *No Product type* /   Secondary Insurance - None  Preferred Pharmacy - [id number] [place], [place] - [address] AT [address] [facility] [address]  [facility]  [phone] [place], [place] - [address]  [facility]  [facility]                     General  Care Manager assessed the patient by : In person interview with patient, Medical record review, Discussion with Clinical Care team, In person interview with family  Orientation Level: Oriented X4  Functional level prior to admission: Independent  Reason for referral: Discharge Planning    Contact/Decision Maker  Extended Emergency Contact Information  Primary Emergency Contact: Althouse,[name]   [place]  Home Phone: [phone]  Mobile Phone: [phone]  Relation: Mother  Secondary Emergency Contact: Stemen,Jeff  Mobile Phone: [phone]  Relation: Father    Legal Next of Kin / Guardian / POA / Advance Directives     HCDM ([place] policy, no known patient preference): Panas,[name] - Mother - [phone]    HCDM, back-up (If primary HCDM is unavailable): Hebenstreit,Jeff - Father - [phone]    Advance Directive (Medical Treatment)  Does patient have an advance directive covering medical treatment?: Patient does not have advance directive covering medical treatment.  Reason patient does not have an advance directive covering medical treatment:: Patient does not wish to complete one at this time.    Health Care Decision Maker [HCDM] (Medical & Mental Health Treatment)  Healthcare Decision Maker: HCDM documented in the HCDM/Contact Info section.  Information offered on HCDM, Medical & Mental Health advance directives:: Patient declined information.         Readmission Information    Have you been hospitalized in the last 30 days?: Yes  Name of Hospital: [facility]  Were you being cared for at a skilled nursing facility:: No     What day were you discharged from that hospital or facility?: 04/11/23  Number of Days between previous discharge and readmission date: 1-3 days    Type of Readmission: Planned Readmission      Patient Information  Lives with: Parent    Type of Residence: Private residence        Location/Detail: [address] [place], [place]    Support Systems/Concerns: Parent, Friends/Neighbors    Responsibilities/Dependents at home?: No    Home Care services in place prior to  admission?: No      Outpatient/Community Resources in place prior to admission: Clinic  Agency detail (Name/Phone #): (PCP) Jarold Motto    Equipment Currently Used at Home: walker, standard, commode chair, other (see comments) (Rollator)  Current HME Agency (Name/Phone #): Infusion supplies through [facility]    Currently receiving outpatient dialysis?: No       Financial Information       Need for financial assistance?: No       Social Determinants of Health  Social Determinants of Health were addressed in provider documentation.  Please refer to patient history.  Social Drivers of Health     Food Insecurity: No Food Insecurity (03/10/2023)    Hunger Vital Sign     Worried About Running Out of Food in the Last Year: Never true     Ran Out of Food in the Last Year: Never true   Internet Connectivity: Not on file   Housing/Utilities: Low Risk  (03/10/2023)    Housing/Utilities     Within the past 12 months, have you ever stayed: outside, in a car, in a tent, in an overnight shelter, or temporarily in someone else's home (i.e. couch-surfing)?: No     Are you worried about losing your housing?: No     Within the past 12 months, have you been unable to get utilities (heat, electricity) when it was really needed?: No   Tobacco Use: Low Risk  (03/31/2023)    Patient History     Smoking Tobacco Use: Never     Smokeless Tobacco Use: Never     Passive Exposure: Past   Transportation Needs: No Transportation Needs (04/11/2023)    PRAPARE - Transportation     Lack of Transportation (Medical): No     Lack of Transportation (Non-Medical): No   Alcohol Use: Not on file   Interpersonal Safety: Not on file   Physical Activity: Not on file   Intimate Partner Violence: Not on file   Stress: Not on file   Substance Use: Not on file (01/12/2023)   Social Connections: Not on file   Financial Resource Strain: Low Risk  (03/10/2023)    Overall Financial Resource Strain (CARDIA)     Difficulty of Paying Living Expenses: Not very hard   Depression: Not at risk (01/25/2022)    Received from [facility]    PHQ-2   Health Literacy: Low Risk  (04/11/2023)    Health Literacy     : Never       Complex Discharge Information    Is patient identified as a difficult/complex discharge?: No      Discharge Needs Assessment  Concerns to be Addressed: discharge planning    Clinical Risk Factors: New Diagnosis, Principal Diagnosis: Cancer, Stroke, COPD, Heart Failure, AMI, Pneumonia, Joint Replacment, Functional Limitations, Readmission < 72 Hours    Barriers to taking medications: No    Prior overnight hospital stay or ED visit in last 90 days: Yes      Anticipated Changes Related to Illness: inability to care for self    Equipment Needed After Discharge: other (see comments) (TBD)    Discharge Facility/Level of Care Needs: other (see comments) ([facility] and [facility] [phone])    Readmission  Risk of Unplanned Readmission Score: UNPLANNED READMISSION SCORE: 33.51%  Predictive Model Details          34% (High)  Factor Value    Calculated 04/13/2023 12:05 26% Number of active inpatient medication orders 53  [facility] Risk of Unplanned Readmission Model 20% Number of hospitalizations in last year 6     8% Number of ED visits in last six months 2     7% Active antipsychotic inpatient medication order present     7% ECG/EKG order present in last 6 months     5% Encounter of ten days or longer in last year present     5% Diagnosis of electrolyte disorder present     5% Imaging order present in last 6 months     4% Phosphorous result present     3% Diagnosis of deficiency anemia present     3% Active anticoagulant inpatient medication order present     3% Active corticosteroid inpatient medication order present     1% Future appointment scheduled     1% Age 42     1% Current length of stay 1.766 days     1% Active ulcer inpatient medication order present      Readmitted Within the Last 30 Days? (No if blank) Yes  Patient at risk for readmission?: Yes    Discharge Plan  Screen findings are: Discharge planning needs identified or anticipated (Comment).    Expected Discharge Date:     Expected Transfer from Critical Care:  (N/A)    Quality data for continuing care services shared with patient and/or representative?: Yes  Patient and/or family were provided with choice of facilities / services that are available and appropriate to meet post hospital care needs?: Yes   List choices in order highest to lowest preferred, if applicable. : See above    Initial Assessment complete?: Yes

## 2023-04-13 NOTE — Unmapped (Signed)
Patient is alert and oriented. Cyclic tube feed with Nutren 2.0 is ongoing and patient is tolerating it well.  PRN Oxycodone and Dilaudid given for abdominal pain. PRN Phenergan given for nausea. Skin and safety protocol maintained. Bed low and locked, call bell within reach. Resting quietly in bed without new concerns. Mother at bedside. Will continue to monitor.    Problem: Rehabilitation (IRF) Plan of Care  Goal: Absence of New-Onset Illness or Injury  Outcome: Progressing  Intervention: Prevent Fall and Fall Injury  Recent Flowsheet Documentation  Taken 04/13/2023 0200 by Gwyneth Sprout, RN  Safety Interventions:   fall reduction program maintained   low bed  Taken 04/13/2023 0000 by Gwyneth Sprout, RN  Safety Interventions:   fall reduction program maintained   low bed  Taken 04/12/2023 2200 by Gwyneth Sprout, RN  Safety Interventions:   fall reduction program maintained   low bed  Taken 04/12/2023 2000 by Gwyneth Sprout, RN  Safety Interventions:   fall reduction program maintained   low bed  Intervention: Prevent Skin Injury  Recent Flowsheet Documentation  Taken 04/12/2023 2200 by Gwyneth Sprout, RN  Device Skin Pressure Protection: absorbent pad utilized/changed  Taken 04/12/2023 2000 by Gwyneth Sprout, RN  Device Skin Pressure Protection: absorbent pad utilized/changed  Intervention: Prevent VTE (Venous Thromboembolism)  Recent Flowsheet Documentation  Taken 04/12/2023 1944 by Gwyneth Sprout, RN  VTE Prevention/Management: anticoagulant therapy  Anti-Embolism Device Status: (Subq Lovenox) Other (Comment)  Goal: Optimal Comfort and Wellbeing  Outcome: Progressing     Problem: Fall Injury Risk  Goal: Absence of Fall and Fall-Related Injury  Outcome: Progressing  Intervention: Promote Injury-Free Environment  Recent Flowsheet Documentation  Taken 04/13/2023 0200 by Gwyneth Sprout, RN  Safety Interventions:   fall reduction program maintained   low bed  Taken 04/13/2023 0000 by Gwyneth Sprout, RN  Safety Interventions:   fall reduction program maintained   low bed  Taken 04/12/2023 2200 by Gwyneth Sprout, RN  Safety Interventions:   fall reduction program maintained   low bed  Taken 04/12/2023 2000 by Gwyneth Sprout, RN  Safety Interventions:   fall reduction program maintained   low bed

## 2023-04-13 NOTE — Unmapped (Signed)
Pt A & O x 4. Cont x 2. Reg diet. Tube feeds run from 4p-8a nutren 2.0. Pain being managed by scheduled meds, PRN dilaudid & oxycodone. Scheduled phenergan. New IV placed today, last IV was leaking. L arm prec, PICC line. NG tube L nare, all meds through. Ambulates w/ walker. Tolerated therapy sessions well. Safety protocols maintained, call bell within reach.   Problem: Rehabilitation (IRF) Plan of Care  Goal: Plan of Care Review  Outcome: Progressing  Flowsheets (Taken 04/13/2023 1901)  Plan of Care Reviewed With: patient  Goal: Patient-Specific Goal (Individualized)  Outcome: Progressing  Goal: Absence of New-Onset Illness or Injury  Outcome: Progressing  Intervention: Prevent Fall and Fall Injury  Recent Flowsheet Documentation  Taken 04/13/2023 1200 by Ron Agee, RN  Safety Interventions: fall reduction program maintained  Taken 04/13/2023 1000 by Ron Agee, RN  Safety Interventions:   fall reduction program maintained   family at bedside   lighting adjusted for tasks/safety   latex precautions  Intervention: Prevent Skin Injury  Recent Flowsheet Documentation  Taken 04/13/2023 1200 by Ron Agee, RN  Device Skin Pressure Protection: absorbent pad utilized/changed  Skin Protection:   adhesive use limited   incontinence pads utilized  Taken 04/13/2023 1000 by Ron Agee, RN  Device Skin Pressure Protection: absorbent pad utilized/changed  Skin Protection:   adhesive use limited   incontinence pads utilized  Intervention: Prevent Infection  Recent Flowsheet Documentation  Taken 04/13/2023 1200 by Ron Agee, RN  Infection Prevention:   hand hygiene promoted   rest/sleep promoted  Taken 04/13/2023 1000 by Ron Agee, RN  Infection Prevention:   hand hygiene promoted   personal protective equipment utilized   rest/sleep promoted  Goal: Optimal Comfort and Wellbeing  Outcome: Progressing  Goal: Home and Community Transition Plan Established  Outcome: Progressing  Goal: Rounds/Family Conference  Outcome: Progressing     Problem: Latex Allergy  Goal: Absence of Allergy Symptoms  Outcome: Progressing     Problem: Self-Care Deficit  Goal: Improved Ability to Complete Activities of Daily Living  Outcome: Progressing     Problem: Fall Injury Risk  Goal: Absence of Fall and Fall-Related Injury  Outcome: Progressing  Intervention: Promote Injury-Free Environment  Recent Flowsheet Documentation  Taken 04/13/2023 1200 by Ron Agee, RN  Safety Interventions: fall reduction program maintained  Taken 04/13/2023 1000 by Ron Agee, RN  Safety Interventions:   fall reduction program maintained   family at bedside   lighting adjusted for tasks/safety   latex precautions

## 2023-04-13 NOTE — Unmapped (Addendum)
Adult Nutrition Assessment Note    Visit Type: RD Risk Alert  Reason for Visit: Enteral Nutrition    NUTRITION INTERVENTIONS and RECOMMENDATION      To meet 100% of needs: Recommend Nutren 2.0 at goal rate 50 mL/hr cycled over 16 hrs.   This provides 1600 kcals, 68 g protein, 173 g carbohydrate, 74 g fat, 0 g fiber, 552 mL free water, and meets 107% USRDI   FWF per MD, minimum of 30 mL q4hr    Pediasure once daily- breakfast  PB&J sandwich with lunch- can keep in between meals or eat as lunch  Weigh weekly  Document PO intakes  Continue guar gum TID         NUTRITION ASSESSMENT     Current  nutrition therapy is appropriate although not meeting nutritional  needs at this time due to poor appetite  Patient appropriate for enteral nutrition support due to inability to meet nutritional needs orally due to poor appetite  Patient consumes one Pediasure and one PB&J per day- ordered pediasure through food Financial planner. Patient with vanilla preference      NUTRITIONALLY RELEVANT DATA     HPI & PMH:   23 y.o. female with PMH of [name] s/p proctocolectomy and J-pouch, desmoid tumours, chronic abdominal pain and nausea, anxiety, PTSD related to past medical care, DVT RUE s/p Eliquis x3 months admitted for constellation of worsening pain, intolerance of oral intake. She is now admitted to [facility] for comprehensive interdisciplinary rehabilitation.     Nutrition History:   02/03- Patient followed by RD services outpatient then inpatient over the past 3 months following previous admission in [facility] 4 months ago. Patient with minimal PO intake, therefore receiving majority of nutrition via NG tube. Patient tolerating Nutren 2.0 at 50 ml/hr over 16 hours. Patient notes some loose stools with Nutren 2.0 but improving with guar gum. Loose Bms could be caused by medication and medical reasons (antibiotics, pouchitis that is resolving, etc.)    Medications:  Nutritionally pertinent medications reviewed and evaluated for potential food and/or medication interactions.   Pepcid, Guar gum TID, Multivitamin with folic acid, Zofran, Protonix, Oxycodone  Labs:   Nutritionally pertinent labs reviewed.     Nutritional Needs:   Daily Estimated Nutrient Needs:  Energy: 1610-9604 kcals 30-35 kcal/kg using other (comment) (weight from 01/17), 51.3 kg (04/13/23 1440)]  Protein: 62-77 gm [1.2-1.5 gm/kg using last recorded weight (weight from 01/17), 51.3 kg (04/13/23 1440)]  Carbohydrate:   [no restriction]  Fluid:   mL [1 mL/kcal (maintenance)]    Anthropometric Data:  Height: 165.1 cm (5' 5)   Admission weight: 55.3 kg (121 lb 14.6 oz)  Last recorded weight: 55.3 kg (121 lb 14.6 oz)  Date of last recorded weight: 02/01  IBW: 56.75 kg  BMI: Body mass index is 20.29 kg/m??.   Usual Body Weight:  About 130 lb over the year  Weight Assessment: Patient and patient's mom report patient weight prior to admission was 113 lb - 17 lb/13% loss over the past 5 months- significant  Wt Readings from Last 25 Encounters:   04/11/23 55.3 kg (121 lb 14.6 oz)   04/06/23 54.4 kg (120 lb)   03/24/23 52.8 kg (116 lb 6.4 oz)   03/17/23 53.5 kg (118 lb)   02/24/23 54.1 kg (119 lb 4.8 oz)   01/23/23 57.4 kg (126 lb 9.6 oz)   01/21/23 58 kg (127 lb 12.8 oz)   01/14/23 58 kg (127 lb 12.8 oz)  12/16/22 58.5 kg (129 lb)   12/16/22 58.5 kg (129 lb)   12/08/22 61.3 kg (135 lb 3.2 oz)   11/14/22 61 kg (134 lb 8 oz)   09/24/22 62.4 kg (137 lb 9.6 oz)   09/16/22 62 kg (136 lb 11.2 oz)   09/02/22 63 kg (139 lb)   08/05/22 62.1 kg (137 lb)   07/18/22 62.8 kg (138 lb 6.4 oz)   07/16/22 62.1 kg (137 lb)   07/14/22 62.9 kg (138 lb 9.6 oz)   06/15/22 63.6 kg (140 lb 3.4 oz)   06/06/22 60.3 kg (132 lb 14.4 oz)   05/02/22 61.8 kg (136 lb 4.8 oz)   04/27/22 62.9 kg (138 lb 10.7 oz)   04/23/22 61.9 kg (136 lb 6.4 oz)   04/22/22 63.4 kg (139 lb 12.4 oz)         Malnutrition Assessment:  Malnutrition Assessment using AND/ASPEN or GLIM Clinical Characteristics:    Non-severe (Moderate) Protein-Calorie Malnutrition in the context of chronic illness (04/13/23 1427)  Interpretation of Wt. Loss: > or equal to 10% x 6 month  Fat Loss: Mild  Muscle Loss: Mild  Malnutrition Score: 3            Nutrition Focused Physical Exam:  Nutrition Focused Physical Exam:  Fat Areas Examined  Upper Arm: Mild loss      Muscle Areas Examined  Temple: Mild loss  Clavicle: Mild loss  Patellar: Mild loss  Anterior Thigh: Mild loss              Nutrition Evaluation  Overall Impressions: Mild fat loss, Mild muscle loss (04/13/23 1426)     Care plan:  Completed    Current Nutrition:  Oral intake   Enteral nutrition via NG tube  Nutrition Orders            Supplement Pediatric; Pediasure (PED Standard); # of Products PER Serving: 1 3xd PC starting at 02/01 1800    Nutren 2.0 cyclic tube feed starting at 02/01 1800    Nutrition Therapy Regular/House starting at 02/01 1745            Nutritionally Pertinent Allergies, Intolerances, Sensitivities, and/or Cultural/Religious Restrictions:  include papaya    GOALS and EVALUATION     Patient to meet 80% or greater of nutritional needs via combination of meals, snacks, and/or oral supplements within admission.  - New    Motivation, Barriers, and Compliance:  Evaluation of motivation, barriers, and compliance completed. No concerns identified at this time.     Discharge Planning:   Monitor for potential discharge needs with multi-disciplinary team.         Follow-Up Parameters:   1-2 times per week (and more frequent as indicated)    Alesia Morin, RD, LDN

## 2023-04-13 NOTE — Unmapped (Signed)
Physical Medicine and Rehabilitation  Individualized Overall Plan of Care  04/13/2023 10:10 AM     Patient Name: [name] [name] [facility]   Medical Record Number: [id number]   Date of Birth: 2000/08/10   Sex: Female   Room/Bed: 8J191/4N829-56     Primary Impairment Group:       Admit Date/Time: 04/11/2023  5:43 PM     Physician Summary:   Paulita Licklider is a 23 y/o female with PMHx of [name] s/p proctocolectomy and J-pouch, desmoid tumours, chronic abdominal pain and nausea, anxiety, PTSD related to past medical care, DVT RUE s/p Eliquis x3 months. Ms. Enamorado was admitted to [facility] on 04/09/2023 for constellation of worsening pain, intolerance of oral intake. Ms. Seres has participated in acute inpatient physical and occupational therapies to improve functional mobility, activity tolerance, functional strength, balance, and endurance in order to facilitate safe performance of ADLs and daily routines. He is stable and awaiting rehab placement. Ms. Widener has been referred to [facility] for continued acute medical management, provision of intensive inpatient therapies, and patient/family training to facilitate safe performance of ADLs and mobility, prior to discharge home.        The patient will receive interdisciplinary care, including daily physician management for the following:   Pain, Wound care, Altered Mental Status, Impaired sleep/wake cycles, Monitoring for medication side effects, Nutrition, and Infection treatment     The patient will benefit from continued services by:   Therapy Type Min/Day Days/Week   Physical therapy 1-2 hours/day 5-7   Occupational therapy 1-2 hours/day 5-7   Speech Therapy         The patient will benefit from additional services by:  None    Rehab nursing is required to help manage the patient???s complex nursing needs related to their documented medical conditions.     The patient's medical prognosis is Fair to achieve the stated goals below and to be able to be discharged home with family assistance or supervision within the estimated length of stay of  .14 days     Patient and Family Goals     ADL: To work on my writing, hand mobility, and walking.  Mobility: to get back to the way she was  Community Reintegration:       Quality Indicators - Goals     Eating Discharge Goal  Discharge Goal: Independent     Oral Hygiene Discharge Goal  Discharge Goal: Independent    Toileting Hygiene Discharge Goal  Discharge Goal: Independent    Toilet Transfer Discharge Goal  Discharge Goal: Independent    Shower/Bathe Self Discharge Goal  Discharge Goal: Independent    Upper Body Dressing Discharge Goal  Discharge Goal: Independent    Lower Body Dressing Discharge Goal  Discharge Goal: Independent    Putting On/Taking Off Footwear Discharge Goal  Discharge Goal: Independent    Roll Left and Right Discharge Goal  Discharge Goal: Independent    Sit to Lying Discharge Goal  Discharge Goal: Independent    Lying to Sitting on Side of Bed Discharge Goal  Discharge Goal: Independent    Sit to Stand Discharge Goal  Discharge Goal: Independent    Chair/Bed-to-Chair Transfer Discharge Goal  Discharge Goal: Independent    Car Transfer Discharge Goal  Discharge Goal: Independent    Walk 10 Feet Discharge Goal  Discharge Goal: Independent    Walk 50 Feet with Two Turns Discharge Goal  Discharge Goal: Supervision or touching assistance    Walk 150  Feet Discharge Goal  Discharge Goal: Supervision or touching assistance    Walking 10 Feet on Uneven Surfaces Discharge Goal  Discharge Goal: Supervision or touching assistance    1 Step (Curb) Discharge Goal  Discharge Goal: Supervision or touching assistance    4 Steps Discharge Goal  Discharge Goal: Supervision or touching assistance    12 Steps Discharge Goal  Discharge Goal: Supervision or touching assistance    Picking Up Object Discharge Goal  Discharge Goal: Supervision or touching assistance    Wheel 50 Feet with Two Turns Discharge Goal  Discharge Goal: Independent    Wheel 150 Feet Discharge Goal  Discharge Goal: Independent

## 2023-04-13 NOTE — Unmapped (Signed)
Physical Medicine and Rehabilitation  Daily Progress Note [facility]    ASSESSMENT:     [name] [name] is a 23 y.o. female 23 y.o. female with PMH of [name] s/p proctocolectomy and J-pouch, desmoid tumours, chronic abdominal pain and nausea, anxiety, PTSD related to past medical care, DVT RUE s/p Eliquis x3 months admitted for constellation of worsening pain, intolerance of oral intake. She is now admitted to [facility] for comprehensive interdisciplinary rehabilitation.      Rehab Impairment Group Code [facility]):   (Debility) 16 Debility (Non-Cardiac/Non-Pulmonary)   Etiology: worsening pain, intolerance of oral intake     PLAN:     This patient is admitted to the Physical Medicine and Rehabilitation - Inpatient - A service from 8am-5pm on weekdays for questions regarding this patient. After hours, weekends, and holidays please contact the 1st Call resident pager     REHAB:   - PT and OT to maximize functional status with mobility and ADLs as well as prevention of joint contracture.   - P&O for assistive devices PRN.  - To be discussed in weekly Interdisciplinary Team Conference.     Acute on chronic abdominal pain and nausea  Complex situation discussed at length in prior notes. In brief, her pain and nausea are (deeply) multifactorial but consensus among treatment team is that new / unknown organic issue is unlikely. Plan to focus on symptom control, adequate nutrition.  Patient is currently at goal tube feeding, tolerating well.  No visible emesis noted.  Still reports persistent nausea but on aggressive nausea management as below. Chronic pain service consulted, appreciated.  - NO CHANGES to pain regimen without multiparty discussion including patient, unless true emergency.  Seen by chronic pain 1/28, recommended to continue the pain regimen as below no changes made.  - Pain management:  -- oxycodone (liquid) 20 mg q4 prn-first-line for pain  -- changed hydromorphone 1 mg IV q6prn -second line for pain  (ONLY IF inadequate relief 1 hour after oral alternative)  -- buprenorphine 20 mcg/h patch   -- hyoscyamine prn  - Nausea: discuss about transitioning one of the IV nausea medication to p.o.  -- ondansetron IV q8 PRN  -- promethazine IV q6   -- scopolamine patch  -- olanzapine 5 mg in evening  -- no diphenhydramine IV in absence of clear indication  - Dyspepsia:  -- famotidine bid  -- PPI IV q12  - GI motility:  -- naloxegol prn  -- okay to use loperamide sparingly     Left arm PICC line site swelling, pain and ecchymosis: Left arm PICC line site noted mild swelling and mild ecchymosis from insertion.  No visible swelling noted below the elbow.  PVL confirmed acute DVT, started on anticoagulation and removed PICC line on 1/28, given acute DVT also patient is currently not on any IV antibiotic. Removed PICC line 1/28,  swelling around prior picc line site improving, mild redness/ rash  due to tape, still has bruises but resolving.   -pt has received IV benadryl prn for itching at site; try to avoid if possible     Inadequate oral intake  Abnormal gut anatomy notwithstanding, it is the consensus among hospitalist and GI services that oral and/or enteral feeding should be sufficient to meet caloric and hydration needs. Has not developed refeeding syndrome. TPN will not be offered in forseeable future.  - titrate tube feeds to ~100% USRDI = 65 mL/h: reached at 60cc/hr- tolerated well.   - Per dietitian, patient requested  for cyclic feeding , started on cyclic feeding with Nutren 2.0, at goal rate 50 cc/h for 16 hours with acute 4-hour water flushes 150 cc .   - Calorie count per nutrition  - follow BMP, Mg phos q48-72, stop once at goal feeds  - GI to follow and manage wean from enteral nutrition at discharge     Elevated transaminases  Improving: Labs with AST 21, ALT 70, downtrending from prior. No evidence of cholestasis or synthetic dysfunction. Presume drug-related. No longer following closely as trend is well established.     Pouchitis (dx 03/2023), resolving  No plan for repeat endoscopy or colonoscopy during this admission, per GI, given recent exam. No significant decrement in Hb so far  - Cefdinir 300 mg daily (end = 05/05/2023)  - Completed Cefdinir 300 mg bid for 4 weeks (end = 04/07/2023)  - CBC weekly     Desmoid tumors  - okay to hold nirogesat while having difficulty with oral meds, no urgency to resume per Dr [name]     Mood / anxiety  - continue escitalopram  - lorazepam 1 mg bid prn (home medication)     Daily Checklist  - Diet: Regular diet with cyclic tube feeds  - DVT PPX: SQ enoxaparin   - GI PPX: IV protonix BID  - Access: piv    DME needs  - To be determined    DISPO: Patient to be discussed at weekly interdisciplinary team conference.   - EDD: TBD.   - Follow-up: PCP, PM&R, GI, Oncology    SUBJECTIVE:     Interval Events:   Patient is requesting her antiemetic Phenergan is pushed instead of an infusion.  She also would rather that her Phenergan remains scheduled.  We discussed that transitioning to p.o. medications remains a priority while here in rehab.  We will discuss with pharmacy if we can get her Phenergan to the floor quicker when she does require it.  Reached out to her oncology team who does not want to start her p.o. chemo at this time.    OBJECTIVE:     Vital signs (last 24 hours):  Temp:  [36.4 ??C (97.5 ??F)-36.5 ??C (97.7 ??F)] 36.5 ??C (97.7 ??F)  Heart Rate:  [75-84] 84  Resp:  [18] 18  BP: (117-126)/(63-77) 126/63  MAP (mmHg):  [81-90] 81  SpO2:  [97 %-98 %] 97 %    Intake/Output (last 3 shifts):  I/O last 3 completed shifts:  In: 280 [I.V.:20; NG/GT:260]  Out: 0     Physical Exam:   GEN: NAD, sitting up in chair at bedside  EYES: sclera anicteric, conjunctiva clear   HEENT: MMM, NG tube in place  NECK: trachea midline  CV: warm extremities, well perfused, no cyanosis   RESP:  NWOB on RA  ABD: Not distended, tender to palpation in all 4 quadrants, no rebound tenderness or guarding.  SKIN: no visible masses, lesions, rashes, ecchymoses.   MSK: no notable contractures or ext swelling  NEURO:Alert, MAE, speech fluid and coherent, responds appropriately to questions   PSYCH: mood euthymic, affect appropriate, thought process logical         Medications:  Scheduled    acetaminophen (TYLENOL) oral liquid Q8H SCH    ammonium lactate (LAC-HYDRIN) 12 % lotion 1 Application BID    cefdinir (OMNICEF) oral suspension Daily    cetirizine (ZYRTEC) oral syrup BID    enoxaparin (LOVENOX) syringe 50 mg Q12H SCH    escitalopram oxalate (LEXAPRO) tablet 5  mg Daily    famotidine (PEPCID) oral suspension Nightly    guar gum (NUTRISOURCE) 1 packet TID    lidocaine (ASPERCREME) 4 % 1 patch Daily    multivitamin with folic acid 400 mcg tablet 1 tablet Daily    OLANZapine zydis (ZYPREXA) disintegrating tablet 5 mg Q PM    ondansetron (ZOFRAN) injection 8 mg Q8H    pantoprazole (Protonix) injection 40 mg BID    scopolamine (TRANSDERM-SCOP) 1 mg over 3 days topical patch 1 mg Q72H    sodium chloride (NS) 0.9 % flush 10 mL Q8H     PRN hydrocortisone, , BID PRN  hydrocortisone, , BID PRN  HYDROmorphone, 1 mg, Q6H PRN  hyoscyamine, 125 mcg, Q4H PRN  loperamide, 2 mg, QID PRN  LORazepam, 1 mg, BID PRN  melatonin, 3 mg, Nightly PRN  naloxegol, 12.5 mg, Daily PRN  oxyCODONE, 20 mg, Q4H PRN  promethazine, 12.5 mg, Q6H PRN  zinc oxide-cod liver oil, , Daily PRN          Labs/Studies: Reviewed     Radiology Results:   No results found.      Quality Indicators      Hearing, Speech, and Vision  Ability to Hear: Adequate  Ability to See in Adequate Light: Adequate  Expression of Ideas and Wants: Without difficulty  Understanding Verbal and Non-Verbal Content: Understands    Cognitive Pattern Assessment  Cognitive Pattern Assessment Used: BIMS  Brief Interview for Mental Status (BIMS)  Repetition of Three Words (First Attempt): 3  Temporal Orientation: Year: Correct  Temporal Orientation: Month: Accurate within 5 days  Temporal Orientation: Day: Correct  Recall: Sock: Yes, no cue required  Recall: Blue: Yes, no cue required  Recall: Bed: Yes, no cue required  BIMS Summary Score: 15            ADLs  Admission Current   Eating Assistance Needed: Set-up / clean-up    CARE Score - 5 Assistance Needed: Set-up / clean-up    CARE Score - 5   Oral Hygiene  Assistance Needed: Physical assistance Total assistance  CARE Score - 1  Assistance Needed: Physical assistance Total assistance  CARE Score - 1   Bladder Continence       Bowel Continence       Toileting Hygiene Assistance Needed: Incidental touching    CARE Score - 4 Assistance Needed: Incidental touching    CARE Score - 4    Toilet Transfer Assistance Needed: Physical assistance 25% or less  CARE Score - 3 Assistance Needed: Physical assistance 25% or less  CARE Score - 3   Shower/Bathe Self      CARE Score - 88       CARE Score - 88   Upper Body Dressing Assistance Needed: Physical assistance 26%-50%  CARE Score - 3  Assistance Needed: Physical assistance 26%-50%  CARE Score - 3   Lower Body Dressing Assistance Needed: Physical assistance 76% or more  CARE Score - 2  Assistance Needed: Physical assistance 76% or more  CARE Score - 2   On/Off Footwear Assistance Needed: Physical assistance Total assistance  CARE Score - 1  Assistance Needed: Physical assistance Total assistance  CARE Score - 1         Transfers Admission Current   Bed to Chair Assistance Needed: Incidental touching    CARE Score - 4  Assistance Needed: Incidental touching    CARE Score - 4     Lying to Sitting Assistance  Needed: Physical assistance 26%-50%  CARE Score - 3  Assistance Needed: Physical assistance 26%-50%  CARE Score - 3   Roll Left/Right Assistance Needed: Physical assistance 25% or less  CARE Score - 3  Assistance Needed: Physical assistance 25% or less  CARE Score - 3   Sit to Lying Assistance Needed: Physical assistance 26%-50%  CARE Score - 3  Assistance Needed: Physical assistance 26%-50%  CARE Score - 3   Sit to Stand Assistance Needed: Incidental touching    CARE Score - 4  Assistance Needed: Incidental touching    CARE Score - 4         Mobility Admission Current   Walk 10 Feet Assistance Needed: Adaptive equipment, Physical assistance 25% or less  CARE Score - 3 Assistance Needed: Adaptive equipment, Physical assistance 25% or less  CARE Score - 3     Walk 50 Feet 2 Turns Assistance Needed: Physical assistance 51%-75%  CARE Score - 2 Assistance Needed: Physical assistance 51%-75%  CARE Score - 2   Walk 150 Feet      CARE Score - 88       CARE Score - 88   Walk 10 Feet Uneven      CARE Score - 88       CARE Score - 88   1 Step (Curb)      CARE Score - 88       CARE Score - 88   4 Steps      CARE Score - 88       CARE Score - 88   12 Steps      CARE Score - 88       CARE Score - 88   Picking Up Object      CARE Score - 10       CARE Score - 10   Wheelchair/Scooter Use        Wheel 50 Feet 2 Turns Assistance Needed: Independent      CARE Score - Wheel 50 Feet with Two Turns: 6    Type of Wheelchair/Scooter: Manual Assistance Needed: Independent      CARE Score - Wheel 50 Feet with Two Turns: 6    Type of Wheelchair/Scooter: Manual   Wheel 150 Feet Assistance Needed: Physical assistance 26%-50%    CARE Score - Wheel 150 Feet: 3    Type of Wheelchair/Scooter: Manual  Assistance Needed: Physical assistance 26%-50%    CARE Score - Wheel 150 Feet: 3    Type of Wheelchair/Scooter: Manual

## 2023-04-14 MED ADMIN — oxyCODONE (ROXICODONE) 5 mg/5 mL solution 20 mg: 20 mg | ORAL | @ 07:00:00 | Stop: 2023-04-19

## 2023-04-14 MED ADMIN — multivitamin with folic acid 400 mcg tablet 1 tablet: 1 | ORAL | @ 13:00:00

## 2023-04-14 MED ADMIN — enoxaparin (LOVENOX) syringe 50 mg: 1 mg/kg | SUBCUTANEOUS | @ 17:00:00

## 2023-04-14 MED ADMIN — acetaminophen (TYLENOL) oral liquid: 1000 mg | ENTERAL | @ 19:00:00

## 2023-04-14 MED ADMIN — famotidine (PEPCID) oral suspension: 20 mg | ENTERAL | @ 03:00:00

## 2023-04-14 MED ADMIN — oxyCODONE (ROXICODONE) 5 mg/5 mL solution 20 mg: 20 mg | ORAL | @ 13:00:00 | Stop: 2023-04-19

## 2023-04-14 MED ADMIN — HYDROmorphone (PF) (DILAUDID) injection 1 mg: 1 mg | INTRAVENOUS | @ 17:00:00 | Stop: 2023-04-17

## 2023-04-14 MED ADMIN — ammonium lactate (LAC-HYDRIN) 12 % lotion 1 Application: 1 | TOPICAL | @ 13:00:00

## 2023-04-14 MED ADMIN — promethazine (PHENERGAN) 12.5 mg in sodium chloride 0.9 % 50 mL IVPB (premix): 12.5 mg | INTRAVENOUS | @ 17:00:00

## 2023-04-14 MED ADMIN — escitalopram oxalate (LEXAPRO) tablet 5 mg: 5 mg | ENTERAL | @ 13:00:00

## 2023-04-14 MED ADMIN — promethazine (PHENERGAN) 12.5 mg in sodium chloride 0.9 % 50 mL IVPB (premix): 12.5 mg | INTRAVENOUS | @ 05:00:00

## 2023-04-14 MED ADMIN — HYDROmorphone (PF) (DILAUDID) injection 1 mg: 1 mg | INTRAVENOUS | @ 12:00:00 | Stop: 2023-04-17

## 2023-04-14 MED ADMIN — promethazine (PHENERGAN) 12.5 mg in sodium chloride 0.9 % 50 mL IVPB (premix): 12.5 mg | INTRAVENOUS | @ 23:00:00

## 2023-04-14 MED ADMIN — sodium chloride (NS) 0.9 % flush 10 mL: 10 mL | INTRAVENOUS | @ 19:00:00

## 2023-04-14 MED ADMIN — guar gum (NUTRISOURCE) 1 packet: 1 | ENTERAL | @ 13:00:00

## 2023-04-14 MED ADMIN — HYDROmorphone (PF) (DILAUDID) injection 1 mg: 1 mg | INTRAVENOUS | @ 05:00:00 | Stop: 2023-04-17

## 2023-04-14 MED ADMIN — sodium chloride (NS) 0.9 % flush 10 mL: 10 mL | INTRAVENOUS | @ 03:00:00

## 2023-04-14 MED ADMIN — cetirizine (ZYRTEC) oral syrup: 20 mg | ENTERAL | @ 13:00:00

## 2023-04-14 MED ADMIN — cefdinir (OMNICEF) oral suspension: 300 mg | ORAL | @ 13:00:00 | Stop: 2023-05-06

## 2023-04-14 MED ADMIN — ammonium lactate (LAC-HYDRIN) 12 % lotion 1 Application: 1 | TOPICAL | @ 03:00:00

## 2023-04-14 MED ADMIN — pantoprazole (Protonix) injection 40 mg: 40 mg | INTRAVENOUS | @ 13:00:00

## 2023-04-14 MED ADMIN — acetaminophen (TYLENOL) oral liquid: 1000 mg | ENTERAL | @ 03:00:00

## 2023-04-14 MED ADMIN — pantoprazole (Protonix) injection 40 mg: 40 mg | INTRAVENOUS | @ 03:00:00

## 2023-04-14 MED ADMIN — acetaminophen (TYLENOL) oral liquid: 1000 mg | ENTERAL | @ 12:00:00

## 2023-04-14 MED ADMIN — lidocaine (ASPERCREME) 4 % 1 patch: 1 | TRANSDERMAL | @ 13:00:00

## 2023-04-14 MED ADMIN — HYDROmorphone (PF) (DILAUDID) injection 1 mg: 1 mg | INTRAVENOUS | @ 23:00:00 | Stop: 2023-04-17

## 2023-04-14 MED ADMIN — enoxaparin (LOVENOX) syringe 50 mg: 1 mg/kg | SUBCUTANEOUS | @ 03:00:00

## 2023-04-14 MED ADMIN — guar gum (NUTRISOURCE) 1 packet: 1 | ENTERAL | @ 03:00:00

## 2023-04-14 MED ADMIN — oxyCODONE (ROXICODONE) 5 mg/5 mL solution 20 mg: 20 mg | ORAL | @ 22:00:00 | Stop: 2023-04-19

## 2023-04-14 MED ADMIN — promethazine (PHENERGAN) 12.5 mg in sodium chloride 0.9 % 50 mL IVPB (premix): 12.5 mg | INTRAVENOUS | @ 12:00:00

## 2023-04-14 MED ADMIN — oxyCODONE (ROXICODONE) 5 mg/5 mL solution 20 mg: 20 mg | ORAL | @ 01:00:00 | Stop: 2023-04-19

## 2023-04-14 MED ADMIN — guar gum (NUTRISOURCE) 1 packet: 1 | ENTERAL | @ 19:00:00

## 2023-04-14 MED ADMIN — ondansetron (ZOFRAN) injection 8 mg: 8 mg | INTRAVENOUS | @ 07:00:00

## 2023-04-14 MED ADMIN — OLANZapine zydis (ZYPREXA) disintegrating tablet 5 mg: 5 mg | ORAL | @ 03:00:00

## 2023-04-14 MED ADMIN — sodium chloride (NS) 0.9 % flush 10 mL: 10 mL | INTRAVENOUS | @ 12:00:00

## 2023-04-14 NOTE — Unmapped (Signed)
Physical Medicine and Rehabilitation  Daily Progress Note [facility]    ASSESSMENT:     [name] [name] is a 23 y.o. female 23 y.o. female with PMH of [name] s/p proctocolectomy and J-pouch, desmoid tumours, chronic abdominal pain and nausea, anxiety, PTSD related to past medical care, DVT RUE s/p Eliquis x3 months admitted for constellation of worsening pain, intolerance of oral intake. She is now admitted to [facility] for comprehensive interdisciplinary rehabilitation.      Rehab Impairment Group Code [facility]):   (Debility) 16 Debility (Non-Cardiac/Non-Pulmonary)   Etiology: worsening pain, intolerance of oral intake     PLAN:     This patient is admitted to the Physical Medicine and Rehabilitation - Inpatient - A service from 8am-5pm on weekdays for questions regarding this patient. After hours, weekends, and holidays please contact the 1st Call resident pager     REHAB:   - PT and OT to maximize functional status with mobility and ADLs as well as prevention of joint contracture.   - P&O for assistive devices PRN.  - To be discussed in weekly Interdisciplinary Team Conference.     Acute on chronic abdominal pain and nausea  Complex situation discussed at length in prior notes. In brief, her pain and nausea are (deeply) multifactorial but consensus among treatment team is that new / unknown organic issue is unlikely. Plan to focus on symptom control, adequate nutrition.  Patient is currently at goal tube feeding, tolerating well.  No visible emesis noted.  Still reports persistent nausea but on aggressive nausea management as below. Chronic pain service consulted, appreciated.  - NO CHANGES to pain regimen without multiparty discussion including patient, unless true emergency.  Seen by chronic pain 1/28, recommended to continue the pain regimen as below no changes made.  - Pain management:  -- oxycodone (liquid) 20 mg q4 prn-first-line for pain  -- changed hydromorphone 1 mg IV q6prn -second line for pain  (ONLY IF inadequate relief 1 hour after oral alternative)  -- buprenorphine 20 mcg/h patch   -- hyoscyamine prn  -- Baclofen 5mg  BID PRN  - Nausea: discuss about transitioning one of the IV nausea medication to p.o.  -- ondansetron IV q8 PRN  -- promethazine IV q6   -- scopolamine patch  -- olanzapine 5 mg in evening  -- no diphenhydramine IV in absence of clear indication  - Dyspepsia:  -- famotidine bid  -- PPI IV q12  - GI motility:  -- naloxegol prn  -- okay to use loperamide sparingly     Left arm PICC line site swelling, pain and ecchymosis: Left arm PICC line site noted mild swelling and mild ecchymosis from insertion.  No visible swelling noted below the elbow.  PVL confirmed acute DVT, started on anticoagulation and removed PICC line on 1/28, given acute DVT also patient is currently not on any IV antibiotic. Removed PICC line 1/28,  swelling around prior picc line site improving, mild redness/ rash  due to tape, still has bruises but resolving.   -pt has received IV benadryl prn for itching at site; try to avoid if possible     Inadequate oral intake  Abnormal gut anatomy notwithstanding, it is the consensus among hospitalist and GI services that oral and/or enteral feeding should be sufficient to meet caloric and hydration needs. Has not developed refeeding syndrome. TPN will not be offered in forseeable future.  - titrate tube feeds to ~100% USRDI = 65 mL/h: reached at 60cc/hr- tolerated well.   -  Per dietitian, patient requested for cyclic feeding , started on cyclic feeding with Nutren 2.0, at goal rate 50 cc/h for 16 hours with acute 4-hour water flushes 150 cc .   - Calorie count per nutrition  - follow BMP, Mg phos q48-72, stop once at goal feeds  - GI to follow and manage wean from enteral nutrition at discharge     Elevated transaminases  Improving: Labs with AST 21, ALT 70, downtrending from prior. No evidence of cholestasis or synthetic dysfunction. Presume drug-related. No longer following closely as trend is well established.     Pouchitis (dx 03/2023), resolving  No plan for repeat endoscopy or colonoscopy during this admission, per GI, given recent exam. No significant decrement in Hb so far  - Cefdinir 300 mg daily (end = 05/05/2023)  - Completed Cefdinir 300 mg bid for 4 weeks (end = 04/07/2023)  - CBC weekly     Desmoid tumors  - okay to hold nirogesat while having difficulty with oral meds, no urgency to resume per Dr [name]     Mood / anxiety  - continue escitalopram  - lorazepam 1 mg bid prn (home medication)     Daily Checklist  - Diet: Regular diet with cyclic tube feeds  - DVT PPX: SQ enoxaparin   - GI PPX: IV protonix BID  - Access: piv    DME needs  - To be determined    DISPO: Patient to be discussed at weekly interdisciplinary team conference.   - EDD: 2/13  - Follow-up: PCP, PM&R, GI, Oncology    SUBJECTIVE:     Interval Events: NAEO. She is seen in her room with her mom again. She was hoping to have her phenergan be a push but with it being caustic on her vessels- we discussed just having it as IV piggyback at this time. In team conference we discussed 2/13 as her date of discharge. She is concerned with tightness that she feels inhibits her movements. She and mom would like to try baclofen as that has helped in the past. We can order baclofen 5mg  BID PRN.     OBJECTIVE:     Vital signs (last 24 hours):  Temp:  [36.7 ??C (98.1 ??F)] 36.7 ??C (98.1 ??F)  Heart Rate:  [59-84] 59  Resp:  [18] 18  BP: (111-124)/(51-75) 111/51  MAP (mmHg):  [68-87] 68  SpO2:  [97 %-98 %] 97 %    Intake/Output (last 3 shifts):  I/O last 3 completed shifts:  In: 140 [NG/GT:140]  Out: -     Physical Exam:   GEN: NAD, sitting up in manual wheelchair  EYES: sclera anicteric, conjunctiva clear   HEENT: MMM, NG tube in place  NECK: trachea midline  CV: warm extremities, well perfused, no cyanosis   RESP:  NWOB on RA  ABD: Not distended  SKIN: no visible masses, lesions, rashes, ecchymoses.   MSK: no notable contractures or ext swelling  NEURO:Alert, MAE, speech fluid and coherent, responds appropriately to questions   PSYCH: mood euthymic, affect appropriate, thought process logical         Medications:  Scheduled    acetaminophen (TYLENOL) oral liquid Q8H SCH    ammonium lactate (LAC-HYDRIN) 12 % lotion 1 Application BID    cefdinir (OMNICEF) oral suspension Daily    cetirizine (ZYRTEC) oral syrup BID    enoxaparin (LOVENOX) syringe 50 mg Q12H SCH    escitalopram oxalate (LEXAPRO) tablet 5 mg Daily    famotidine (PEPCID) oral  suspension Nightly    guar gum (NUTRISOURCE) 1 packet TID    lidocaine (ASPERCREME) 4 % 1 patch Daily    multivitamin with folic acid 400 mcg tablet 1 tablet Daily    OLANZapine zydis (ZYPREXA) disintegrating tablet 5 mg Q PM    pantoprazole (Protonix) injection 40 mg BID    promethazine (PHENERGAN) 12.5 mg in sodium chloride 0.9 % 50 mL IVPB (premix) Q6H    scopolamine (TRANSDERM-SCOP) 1 mg over 3 days topical patch 1 mg Q72H    sodium chloride (NS) 0.9 % flush 10 mL Q8H     PRN baclofen, 5 mg, BID PRN  hydrocortisone, , BID PRN  hydrocortisone, , BID PRN  HYDROmorphone, 1 mg, Q6H PRN  hyoscyamine, 125 mcg, Q4H PRN  loperamide, 2 mg, QID PRN  LORazepam, 1 mg, BID PRN  melatonin, 3 mg, Nightly PRN  naloxegol, 12.5 mg, Daily PRN  ondansetron, 8 mg, Q8H PRN  oxyCODONE, 20 mg, Q4H PRN  zinc oxide-cod liver oil, , Daily PRN          Labs/Studies: Reviewed     Radiology Results:   XR Knee 4 Or More Views Right  Result Date: 04/13/2023  EXAM: XR KNEE 4 OR MORE VIEWS RIGHT DATE: 04/13/2023 4:18 PM ACCESSION: [id number] UN DICTATED: 04/13/2023 4:20 PM INTERPRETATION LOCATION: [facility]     CLINICAL INDICATION: 23 years old Female with He of garner syndrome. Assess for new growths    COMPARISON: Radiographs dated 11/12/2022     TECHNIQUE: AP, lateral and oblique views of the right knee.     FINDINGS: There is no change. No fracture, dislocation or lytic or blastic osseous lesion is seen.         No acute osseous abnormality. No change.         Quality Indicators      Hearing, Speech, and Vision  Ability to Hear: Adequate  Ability to See in Adequate Light: Adequate  Expression of Ideas and Wants: Without difficulty  Understanding Verbal and Non-Verbal Content: Understands    Cognitive Pattern Assessment  Cognitive Pattern Assessment Used: BIMS  Brief Interview for Mental Status (BIMS)  Repetition of Three Words (First Attempt): 3  Temporal Orientation: Year: Correct  Temporal Orientation: Month: Accurate within 5 days  Temporal Orientation: Day: Correct  Recall: Sock: Yes, no cue required  Recall: Blue: Yes, no cue required  Recall: Bed: Yes, no cue required  BIMS Summary Score: 15       Nutritional Approaches  Nutritional Approach: Feeding tube    ADLs  Admission Current   Eating Assistance Needed: Set-up / clean-up    CARE Score - 5 Assistance Needed: Set-up / clean-up    CARE Score - 5   Oral Hygiene  Assistance Needed: Physical assistance Total assistance  CARE Score - 1  Assistance Needed: Physical assistance Total assistance  CARE Score - 1   Bladder Continence       Bowel Continence       Toileting Hygiene Assistance Needed: Incidental touching    CARE Score - 4 Assistance Needed: Incidental touching    CARE Score - 4    Toilet Transfer Assistance Needed: Physical assistance 25% or less  CARE Score - 3 Assistance Needed: Physical assistance 25% or less  CARE Score - 3   Shower/Bathe Self      CARE Score - 88       CARE Score - 88   Upper Body Dressing Assistance Needed: Physical assistance 26%-50%  CARE Score - 3  Assistance Needed: Physical assistance 26%-50%  CARE Score - 3   Lower Body Dressing Assistance Needed: Physical assistance 76% or more  CARE Score - 2  Assistance Needed: Physical assistance 76% or more  CARE Score - 2   On/Off Footwear Assistance Needed: Physical assistance Total assistance  CARE Score - 1  Assistance Needed: Physical assistance Total assistance  CARE Score - 1 Transfers Admission Current   Bed to Chair Assistance Needed: Incidental touching    CARE Score - 4  Assistance Needed: Incidental touching    CARE Score - 4     Lying to Sitting Assistance Needed: Physical assistance 26%-50%  CARE Score - 3  Assistance Needed: Physical assistance 26%-50%  CARE Score - 3   Roll Left/Right Assistance Needed: Physical assistance 25% or less  CARE Score - 3  Assistance Needed: Physical assistance 25% or less  CARE Score - 3   Sit to Lying Assistance Needed: Physical assistance 26%-50%  CARE Score - 3  Assistance Needed: Physical assistance 26%-50%  CARE Score - 3   Sit to Stand Assistance Needed: Incidental touching    CARE Score - 4  Assistance Needed: Incidental touching    CARE Score - 4         Mobility Admission Current   Walk 10 Feet Assistance Needed: Adaptive equipment, Physical assistance 25% or less  CARE Score - 3 Assistance Needed: Adaptive equipment, Physical assistance 25% or less  CARE Score - 3     Walk 50 Feet 2 Turns Assistance Needed: Physical assistance 51%-75%  CARE Score - 2 Assistance Needed: Physical assistance 51%-75%  CARE Score - 2   Walk 150 Feet      CARE Score - 88       CARE Score - 88   Walk 10 Feet Uneven      CARE Score - 88       CARE Score - 88   1 Step (Curb)      CARE Score - 88       CARE Score - 88   4 Steps      CARE Score - 88       CARE Score - 88   12 Steps      CARE Score - 88       CARE Score - 88   Picking Up Object      CARE Score - 10       CARE Score - 10   Wheelchair/Scooter Use        Wheel 50 Feet 2 Turns Assistance Needed: Independent      CARE Score - Wheel 50 Feet with Two Turns: 6    Type of Wheelchair/Scooter: Manual Assistance Needed: Independent      CARE Score - Wheel 50 Feet with Two Turns: 6    Type of Wheelchair/Scooter: Manual   Wheel 150 Feet Assistance Needed: Physical assistance 26%-50%    CARE Score - Wheel 150 Feet: 3    Type of Wheelchair/Scooter: Manual  Assistance Needed: Physical assistance 26%-50%    CARE Score - Wheel 150 Feet: 3    Type of Wheelchair/Scooter: Manual

## 2023-04-14 NOTE — Unmapped (Signed)
No acute events overnight. Patient is alert and oriented. Patient is tolerating cyclic tube feed (4pm-8am) with Nutren 2.0 at the rate of 73ml/hr and 75ml flush every three hours. Left forearm PIV flushes well. Most meds given via L nare NG tube and a few via IV. Pain continuous to be managed with scheduled pain medications and PRN Dilaudid and oxycodone. Nausea managed with scheduled Phenergan and PRN Zofran. No skin changes noted. Safety precautions maintained. Mother at bedside and involved in patient care. Resting quietly in bed without new concerns at this time. Will continue to monitor.    Problem: Rehabilitation (IRF) Plan of Care  Goal: Absence of New-Onset Illness or Injury  Outcome: Progressing  Intervention: Prevent Fall and Fall Injury  Recent Flowsheet Documentation  Taken 04/14/2023 0400 by Gwyneth Sprout, RN  Safety Interventions:   fall reduction program maintained   low bed  Taken 04/14/2023 0200 by Gwyneth Sprout, RN  Safety Interventions: fall reduction program maintained  Taken 04/14/2023 0000 by Gwyneth Sprout, RN  Safety Interventions:   fall reduction program maintained   low bed  Taken 04/13/2023 2200 by Gwyneth Sprout, RN  Safety Interventions: fall reduction program maintained  Taken 04/13/2023 2000 by Gwyneth Sprout, RN  Safety Interventions:   fall reduction program maintained   low bed  Intervention: Prevent VTE (Venous Thromboembolism)  Recent Flowsheet Documentation  Taken 04/13/2023 2155 by Gwyneth Sprout, RN  VTE Prevention/Management: anticoagulant therapy  Anti-Embolism Device Status: (SubQ Lovenox) Other (Comment)  Goal: Optimal Comfort and Wellbeing  Outcome: Ongoing - Unchanged     Problem: Fall Injury Risk  Goal: Absence of Fall and Fall-Related Injury  Outcome: Progressing  Intervention: Promote Injury-Free Environment  Recent Flowsheet Documentation  Taken 04/14/2023 0400 by Gwyneth Sprout, RN  Safety Interventions:   fall reduction program maintained   low bed  Taken 04/14/2023 0200 by Gwyneth Sprout, RN  Safety Interventions: fall reduction program maintained  Taken 04/14/2023 0000 by Gwyneth Sprout, RN  Safety Interventions:   fall reduction program maintained   low bed  Taken 04/13/2023 2200 by Gwyneth Sprout, RN  Safety Interventions: fall reduction program maintained  Taken 04/13/2023 2000 by Gwyneth Sprout, RN  Safety Interventions:   fall reduction program maintained   low bed

## 2023-04-14 NOTE — Unmapped (Signed)
Pt up in chair with assist. Pt has family at bedside and is able to make needs known. Pt has ng tube to left nare flushes appropriately. Pt states pain meds effective. Pt works with therapy and tolerates full sessions. Pt maintains safety.   Problem: Rehabilitation (IRF) Plan of Care  Goal: Plan of Care Review  Outcome: Progressing  Goal: Patient-Specific Goal (Individualized)  Outcome: Progressing  Goal: Absence of New-Onset Illness or Injury  Outcome: Progressing  Intervention: Prevent Fall and Fall Injury  Recent Flowsheet Documentation  Taken 04/14/2023 1800 by Horace Porteous, RN  Safety Interventions: fall reduction program maintained  Taken 04/14/2023 1600 by Horace Porteous, RN  Safety Interventions: fall reduction program maintained  Taken 04/14/2023 1500 by Horace Porteous, RN  Safety Interventions: fall reduction program maintained  Taken 04/14/2023 1400 by Horace Porteous, RN  Safety Interventions: fall reduction program maintained  Taken 04/14/2023 1200 by Horace Porteous, RN  Safety Interventions: fall reduction program maintained  Taken 04/14/2023 1000 by Horace Porteous, RN  Safety Interventions: fall reduction program maintained  Taken 04/14/2023 0800 by Horace Porteous, RN  Safety Interventions: fall reduction program maintained  Intervention: Prevent Skin Injury  Recent Flowsheet Documentation  Taken 04/14/2023 1800 by Horace Porteous, RN  Device Skin Pressure Protection: absorbent pad utilized/changed  Skin Protection: incontinence pads utilized  Taken 04/14/2023 1600 by Horace Porteous, RN  Device Skin Pressure Protection: absorbent pad utilized/changed  Skin Protection: incontinence pads utilized  Taken 04/14/2023 1500 by Horace Porteous, RN  Device Skin Pressure Protection: absorbent pad utilized/changed  Skin Protection: incontinence pads utilized  Taken 04/14/2023 1400 by Horace Porteous, RN  Device Skin Pressure Protection: absorbent pad utilized/changed  Skin Protection: incontinence pads utilized  Taken 04/14/2023 1200 by Horace Porteous, RN  Device Skin Pressure Protection: absorbent pad utilized/changed  Skin Protection: incontinence pads utilized  Taken 04/14/2023 0800 by Horace Porteous, RN  Device Skin Pressure Protection: absorbent pad utilized/changed  Skin Protection: incontinence pads utilized  Intervention: Prevent Infection  Recent Flowsheet Documentation  Taken 04/14/2023 1800 by Horace Porteous, RN  Infection Prevention: hand hygiene promoted  Taken 04/14/2023 1600 by Horace Porteous, RN  Infection Prevention: hand hygiene promoted  Taken 04/14/2023 1500 by Horace Porteous, RN  Infection Prevention: hand hygiene promoted  Taken 04/14/2023 1400 by Horace Porteous, RN  Infection Prevention: hand hygiene promoted  Taken 04/14/2023 1200 by Horace Porteous, RN  Infection Prevention: hand hygiene promoted  Taken 04/14/2023 1000 by Horace Porteous, RN  Infection Prevention: hand hygiene promoted  Taken 04/14/2023 0800 by Horace Porteous, RN  Infection Prevention: hand hygiene promoted  Goal: Optimal Comfort and Wellbeing  Outcome: Progressing  Goal: Home and Community Transition Plan Established  Outcome: Progressing  Goal: Rounds/Family Conference  Outcome: Progressing     Problem: Latex Allergy  Goal: Absence of Allergy Symptoms  Outcome: Progressing     Problem: Self-Care Deficit  Goal: Improved Ability to Complete Activities of Daily Living  Outcome: Progressing     Problem: Fall Injury Risk  Goal: Absence of Fall and Fall-Related Injury  Outcome: Progressing  Intervention: Promote Injury-Free Environment  Recent Flowsheet Documentation  Taken 04/14/2023 1800 by Horace Porteous, RN  Safety Interventions: fall reduction program maintained  Taken 04/14/2023 1600 by Horace Porteous, RN  Safety Interventions: fall reduction program maintained  Taken 04/14/2023 1500 by Horace Porteous, RN  Safety Interventions: fall reduction program  maintained  Taken 04/14/2023 1400 by Horace Porteous, RN  Safety Interventions: fall reduction program maintained  Taken 04/14/2023 1200 by Horace Porteous, RN  Safety Interventions: fall reduction program maintained  Taken 04/14/2023 1000 by Horace Porteous, RN  Safety Interventions: fall reduction program maintained  Taken 04/14/2023 0800 by Horace Porteous, RN  Safety Interventions: fall reduction program maintained     Problem: Malnutrition  Goal: Improved Nutritional Intake  Outcome: Progressing

## 2023-04-15 LAB — CBC
HEMATOCRIT: 34.9 % (ref 34.0–44.0)
HEMOGLOBIN: 11.6 g/dL (ref 11.3–14.9)
MEAN CORPUSCULAR HEMOGLOBIN CONC: 33.1 g/dL (ref 32.0–36.0)
MEAN CORPUSCULAR HEMOGLOBIN: 28 pg (ref 25.9–32.4)
MEAN CORPUSCULAR VOLUME: 84.3 fL (ref 77.6–95.7)
MEAN PLATELET VOLUME: 9.8 fL (ref 6.8–10.7)
PLATELET COUNT: 212 10*9/L (ref 150–450)
RED BLOOD CELL COUNT: 4.14 10*12/L (ref 3.95–5.13)
RED CELL DISTRIBUTION WIDTH: 17.3 % — ABNORMAL HIGH (ref 12.2–15.2)
WBC ADJUSTED: 6.2 10*9/L (ref 3.6–11.2)

## 2023-04-15 LAB — BASIC METABOLIC PANEL
ANION GAP: 11 mmol/L (ref 5–14)
BLOOD UREA NITROGEN: 13 mg/dL (ref 9–23)
BUN / CREAT RATIO: 28
CALCIUM: 9.4 mg/dL (ref 8.7–10.4)
CHLORIDE: 102 mmol/L (ref 98–107)
CO2: 29.4 mmol/L (ref 20.0–31.0)
CREATININE: 0.47 mg/dL — ABNORMAL LOW (ref 0.55–1.02)
EGFR CKD-EPI (2021) FEMALE: >90 mL/min/{1.73_m2} (ref >=60)
GLUCOSE RANDOM: 97 mg/dL (ref 70–179)
POTASSIUM: 4 mmol/L (ref 3.4–4.8)
SODIUM: 142 mmol/L (ref 135–145)

## 2023-04-15 LAB — PHOSPHORUS: PHOSPHORUS: 5 mg/dL (ref 2.4–5.1)

## 2023-04-15 LAB — MAGNESIUM: MAGNESIUM: 1.9 mg/dL (ref 1.6–2.6)

## 2023-04-15 MED ADMIN — cefdinir (OMNICEF) oral suspension: 300 mg | ORAL | @ 14:00:00 | Stop: 2023-05-06

## 2023-04-15 MED ADMIN — sodium chloride (NS) 0.9 % flush 10 mL: 10 mL | INTRAVENOUS | @ 11:00:00

## 2023-04-15 MED ADMIN — oxyCODONE (ROXICODONE) 5 mg/5 mL solution 20 mg: 20 mg | ORAL | @ 07:00:00 | Stop: 2023-04-19

## 2023-04-15 MED ADMIN — cetirizine (ZYRTEC) oral syrup: 20 mg | ENTERAL | @ 14:00:00

## 2023-04-15 MED ADMIN — promethazine (PHENERGAN) 12.5 mg in sodium chloride 0.9 % 50 mL IVPB (premix): 12.5 mg | INTRAVENOUS | @ 05:00:00

## 2023-04-15 MED ADMIN — ammonium lactate (LAC-HYDRIN) 12 % lotion 1 Application: 1 | TOPICAL | @ 14:00:00

## 2023-04-15 MED ADMIN — HYDROmorphone (PF) (DILAUDID) injection 1 mg: 1 mg | INTRAVENOUS | @ 17:00:00 | Stop: 2023-04-17

## 2023-04-15 MED ADMIN — pantoprazole (Protonix) injection 40 mg: 40 mg | INTRAVENOUS | @ 02:00:00

## 2023-04-15 MED ADMIN — enoxaparin (LOVENOX) syringe 50 mg: 1 mg/kg | SUBCUTANEOUS | @ 02:00:00

## 2023-04-15 MED ADMIN — buprenorphine 20 mcg/hour transdermal patch 1 patch: 1 | TRANSDERMAL | @ 16:00:00

## 2023-04-15 MED ADMIN — sodium chloride (NS) 0.9 % flush 10 mL: 10 mL | INTRAVENOUS | @ 02:00:00

## 2023-04-15 MED ADMIN — pantoprazole (Protonix) injection 40 mg: 40 mg | INTRAVENOUS | @ 14:00:00

## 2023-04-15 MED ADMIN — acetaminophen (TYLENOL) oral liquid: 1000 mg | ENTERAL | @ 19:00:00

## 2023-04-15 MED ADMIN — multivitamin with folic acid 400 mcg tablet 1 tablet: 1 | ORAL | @ 14:00:00

## 2023-04-15 MED ADMIN — HYDROmorphone (PF) (DILAUDID) injection 1 mg: 1 mg | INTRAVENOUS | Stop: 2023-04-17

## 2023-04-15 MED ADMIN — oxyCODONE (ROXICODONE) 5 mg/5 mL solution 20 mg: 20 mg | ORAL | @ 02:00:00 | Stop: 2023-04-19

## 2023-04-15 MED ADMIN — promethazine (PHENERGAN) 12.5 mg in sodium chloride 0.9 % 50 mL IVPB (premix): 12.5 mg | INTRAVENOUS | @ 11:00:00

## 2023-04-15 MED ADMIN — ondansetron (ZOFRAN) injection 8 mg: 8 mg | INTRAVENOUS | @ 20:00:00

## 2023-04-15 MED ADMIN — ammonium lactate (LAC-HYDRIN) 12 % lotion 1 Application: 1 | TOPICAL | @ 02:00:00

## 2023-04-15 MED ADMIN — enoxaparin (LOVENOX) syringe 50 mg: 1 mg/kg | SUBCUTANEOUS | @ 16:00:00

## 2023-04-15 MED ADMIN — oxyCODONE (ROXICODONE) 5 mg/5 mL solution 20 mg: 20 mg | ORAL | @ 14:00:00 | Stop: 2023-04-19

## 2023-04-15 MED ADMIN — HYDROmorphone (PF) (DILAUDID) injection 1 mg: 1 mg | INTRAVENOUS | @ 05:00:00 | Stop: 2023-04-17

## 2023-04-15 MED ADMIN — sodium chloride (NS) 0.9 % flush 10 mL: 10 mL | INTRAVENOUS | @ 19:00:00

## 2023-04-15 MED ADMIN — promethazine (PHENERGAN) 12.5 mg in sodium chloride 0.9 % 50 mL IVPB (premix): 12.5 mg | INTRAVENOUS

## 2023-04-15 MED ADMIN — ondansetron (ZOFRAN) injection 8 mg: 8 mg | INTRAVENOUS | @ 03:00:00

## 2023-04-15 MED ADMIN — oxyCODONE (ROXICODONE) 5 mg/5 mL solution 20 mg: 20 mg | ORAL | @ 20:00:00 | Stop: 2023-04-19

## 2023-04-15 MED ADMIN — lidocaine (ASPERCREME) 4 % 1 patch: 1 | TRANSDERMAL | @ 14:00:00

## 2023-04-15 MED ADMIN — guar gum (NUTRISOURCE) 1 packet: 1 | ENTERAL | @ 14:00:00

## 2023-04-15 MED ADMIN — acetaminophen (TYLENOL) oral liquid: 1000 mg | ENTERAL | @ 02:00:00

## 2023-04-15 MED ADMIN — famotidine (PEPCID) oral suspension: 20 mg | ENTERAL | @ 02:00:00

## 2023-04-15 MED ADMIN — acetaminophen (TYLENOL) oral liquid: 1000 mg | ENTERAL | @ 11:00:00

## 2023-04-15 MED ADMIN — guar gum (NUTRISOURCE) 1 packet: 1 | ENTERAL | @ 02:00:00

## 2023-04-15 MED ADMIN — promethazine (PHENERGAN) 12.5 mg in sodium chloride 0.9 % 50 mL IVPB (premix): 12.5 mg | INTRAVENOUS | @ 17:00:00

## 2023-04-15 MED ADMIN — HYDROmorphone (PF) (DILAUDID) injection 1 mg: 1 mg | INTRAVENOUS | @ 11:00:00 | Stop: 2023-04-17

## 2023-04-15 MED ADMIN — guar gum (NUTRISOURCE) 1 packet: 1 | ENTERAL | @ 19:00:00

## 2023-04-15 MED ADMIN — escitalopram oxalate (LEXAPRO) tablet 5 mg: 5 mg | ENTERAL | @ 14:00:00

## 2023-04-15 MED ADMIN — fluticasone propionate (FLONASE) 50 mcg/actuation nasal spray 1 spray: 1 | TOPICAL | @ 14:00:00

## 2023-04-15 NOTE — Unmapped (Signed)
No acute events overnight. Patient is alert and oriented. Patient is tolerating cyclic tube feed (4pm-8am) with Nutren 2.0 at the rate of 53ml/hr and 75ml flush every three hours. Left forearm PIV flushes well. Meds given as prescribed. Pain continuous to be managed with scheduled pain medications and PRN Dilaudid and oxycodone. Nausea managed with scheduled Phenergan and PRN Zofran. No skin changes noted. Safety precautions maintained. Mother at bedside and involved in patient's care. Resting quietly in bed without new concerns at this time. Will continue to monitor.     Problem: Rehabilitation (IRF) Plan of Care  Goal: Absence of New-Onset Illness or Injury  Outcome: Progressing  Intervention: Prevent Fall and Fall Injury  Recent Flowsheet Documentation  Taken 04/15/2023 0200 by Gwyneth Sprout, RN  Safety Interventions:   fall reduction program maintained   low bed  Taken 04/15/2023 0000 by Gwyneth Sprout, RN  Safety Interventions: fall reduction program maintained  Taken 04/14/2023 2200 by Gwyneth Sprout, RN  Safety Interventions:   fall reduction program maintained   low bed  Taken 04/14/2023 2000 by Gwyneth Sprout, RN  Safety Interventions:   fall reduction program maintained   family at bedside   low bed  Intervention: Prevent Skin Injury  Recent Flowsheet Documentation  Taken 04/14/2023 2200 by Gwyneth Sprout, RN  Device Skin Pressure Protection: absorbent pad utilized/changed  Taken 04/14/2023 2000 by Gwyneth Sprout, RN  Device Skin Pressure Protection: absorbent pad utilized/changed  Intervention: Prevent Infection  Recent Flowsheet Documentation  Taken 04/15/2023 0200 by Gwyneth Sprout, RN  Infection Prevention: hand hygiene promoted  Taken 04/14/2023 2200 by Gwyneth Sprout, RN  Infection Prevention: hand hygiene promoted  Taken 04/14/2023 2000 by Gwyneth Sprout, RN  Infection Prevention: hand hygiene promoted  Intervention: Prevent VTE (Venous Thromboembolism)  Recent Flowsheet Documentation  Taken 04/14/2023 2115 by Gwyneth Sprout, RN  VTE Prevention/Management: anticoagulant therapy  Anti-Embolism Device Status: (Subq Lovenox) Other (Comment)  Goal: Optimal Comfort and Wellbeing  Outcome: Progressing     Problem: Latex Allergy  Goal: Absence of Allergy Symptoms  Outcome: Progressing     Problem: Fall Injury Risk  Goal: Absence of Fall and Fall-Related Injury  Outcome: Progressing  Intervention: Promote Injury-Free Environment  Recent Flowsheet Documentation  Taken 04/15/2023 0200 by Gwyneth Sprout, RN  Safety Interventions:   fall reduction program maintained   low bed  Taken 04/15/2023 0000 by Gwyneth Sprout, RN  Safety Interventions: fall reduction program maintained  Taken 04/14/2023 2200 by Gwyneth Sprout, RN  Safety Interventions:   fall reduction program maintained   low bed  Taken 04/14/2023 2000 by Gwyneth Sprout, RN  Safety Interventions:   fall reduction program maintained   family at bedside   low bed

## 2023-04-15 NOTE — Unmapped (Signed)
Pt up in chair with assist. Pt has family at bedside and is able to make needs known. Pt has ng tube to left nare flushes appropriately. Pt states pain meds effective. Pt works with therapy and tolerates full sessions. Pt maintains safety. Pt states nausea meds effective.   Problem: Rehabilitation (IRF) Plan of Care  Goal: Plan of Care Review  Outcome: Progressing  Goal: Patient-Specific Goal (Individualized)  Outcome: Progressing  Goal: Absence of New-Onset Illness or Injury  Outcome: Progressing  Intervention: Prevent Fall and Fall Injury  Recent Flowsheet Documentation  Taken 04/15/2023 1800 by Horace Porteous, RN  Safety Interventions: fall reduction program maintained  Taken 04/15/2023 1600 by Horace Porteous, RN  Safety Interventions: fall reduction program maintained  Taken 04/15/2023 1400 by Horace Porteous, RN  Safety Interventions: fall reduction program maintained  Taken 04/15/2023 1200 by Horace Porteous, RN  Safety Interventions: fall reduction program maintained  Taken 04/15/2023 1000 by Horace Porteous, RN  Safety Interventions: fall reduction program maintained  Taken 04/15/2023 0800 by Horace Porteous, RN  Safety Interventions: fall reduction program maintained  Intervention: Prevent Skin Injury  Recent Flowsheet Documentation  Taken 04/15/2023 1800 by Horace Porteous, RN  Device Skin Pressure Protection: absorbent pad utilized/changed  Skin Protection: incontinence pads utilized  Taken 04/15/2023 1600 by Horace Porteous, RN  Device Skin Pressure Protection: absorbent pad utilized/changed  Skin Protection: incontinence pads utilized  Taken 04/15/2023 1400 by Horace Porteous, RN  Device Skin Pressure Protection: absorbent pad utilized/changed  Skin Protection: incontinence pads utilized  Taken 04/15/2023 1200 by Horace Porteous, RN  Device Skin Pressure Protection: absorbent pad utilized/changed  Skin Protection: incontinence pads utilized  Intervention: Prevent Infection  Recent Flowsheet Documentation  Taken 04/15/2023 1800 by Horace Porteous, RN  Infection Prevention: hand hygiene promoted  Taken 04/15/2023 1600 by Horace Porteous, RN  Infection Prevention: hand hygiene promoted  Taken 04/15/2023 1400 by Horace Porteous, RN  Infection Prevention: hand hygiene promoted  Taken 04/15/2023 1200 by Horace Porteous, RN  Infection Prevention: hand hygiene promoted  Taken 04/15/2023 1000 by Horace Porteous, RN  Infection Prevention: hand hygiene promoted  Taken 04/15/2023 0800 by Horace Porteous, RN  Infection Prevention: hand hygiene promoted  Goal: Optimal Comfort and Wellbeing  Outcome: Progressing  Goal: Home and Community Transition Plan Established  Outcome: Progressing  Goal: Rounds/Family Conference  Outcome: Progressing     Problem: Latex Allergy  Goal: Absence of Allergy Symptoms  Outcome: Progressing     Problem: Self-Care Deficit  Goal: Improved Ability to Complete Activities of Daily Living  Outcome: Progressing     Problem: Fall Injury Risk  Goal: Absence of Fall and Fall-Related Injury  Outcome: Progressing  Intervention: Promote Injury-Free Environment  Recent Flowsheet Documentation  Taken 04/15/2023 1800 by Horace Porteous, RN  Safety Interventions: fall reduction program maintained  Taken 04/15/2023 1600 by Horace Porteous, RN  Safety Interventions: fall reduction program maintained  Taken 04/15/2023 1400 by Horace Porteous, RN  Safety Interventions: fall reduction program maintained  Taken 04/15/2023 1200 by Horace Porteous, RN  Safety Interventions: fall reduction program maintained  Taken 04/15/2023 1000 by Horace Porteous, RN  Safety Interventions: fall reduction program maintained  Taken 04/15/2023 0800 by Horace Porteous, RN  Safety Interventions: fall reduction program maintained     Problem: Malnutrition  Goal: Improved Nutritional Intake  Outcome: Progressing

## 2023-04-15 NOTE — Unmapped (Signed)
Physical Medicine and Rehabilitation  Daily Progress Note [facility]    ASSESSMENT:     [name] [name] is a 23 y.o. female 23 y.o. female with PMH of [name] s/p proctocolectomy and J-pouch, desmoid tumours, chronic abdominal pain and nausea, anxiety, PTSD related to past medical care, DVT RUE s/p Eliquis x3 months admitted for constellation of worsening pain, intolerance of oral intake. She is now admitted to [facility] for comprehensive interdisciplinary rehabilitation.      Rehab Impairment Group Code [facility]):   (Debility) 16 Debility (Non-Cardiac/Non-Pulmonary)   Etiology: worsening pain, intolerance of oral intake     PLAN:     This patient is admitted to the Physical Medicine and Rehabilitation - Inpatient - A service from 8am-5pm on weekdays for questions regarding this patient. After hours, weekends, and holidays please contact the 1st Call resident pager     REHAB:   - PT and OT to maximize functional status with mobility and ADLs as well as prevention of joint contracture.   - P&O for assistive devices PRN.  - To be discussed in weekly Interdisciplinary Team Conference.     Acute on chronic abdominal pain and nausea  Complex situation discussed at length in prior notes. In brief, her pain and nausea are (deeply) multifactorial but consensus among treatment team is that new / unknown organic issue is unlikely. Plan to focus on symptom control, adequate nutrition.  Patient is currently at goal tube feeding, tolerating well.  No visible emesis noted.  Still reports persistent nausea but on aggressive nausea management as below. Chronic pain service consulted, appreciated.  - NO CHANGES to pain regimen without multiparty discussion including patient, unless true emergency.  Seen by chronic pain 1/28, recommended to continue the pain regimen as below no changes made.  - Pain management:  -- oxycodone (liquid) 20 mg q4 prn-first-line for pain  -- changed hydromorphone 1 mg IV q6prn -second line for pain  (ONLY IF inadequate relief 1 hour after oral alternative)  -- buprenorphine 20 mcg/h patch   -- hyoscyamine prn  -- Baclofen 5mg  BID PRN  - Nausea: discuss about transitioning one of the IV nausea medication to p.o.  -- ondansetron IV q8 PRN  -- promethazine IV q6   -- scopolamine patch  -- olanzapine 5 mg in evening  -- no diphenhydramine IV in absence of clear indication  - Dyspepsia:  -- famotidine bid  -- PPI IV q12  - GI motility:  -- naloxegol prn  -- okay to use loperamide sparingly     Left arm PICC line site swelling, pain and ecchymosis: Left arm PICC line site noted mild swelling and mild ecchymosis from insertion.  No visible swelling noted below the elbow.  PVL confirmed acute DVT, started on anticoagulation and removed PICC line on 1/28, given acute DVT also patient is currently not on any IV antibiotic. Removed PICC line 1/28,  swelling around prior picc line site improving, mild redness/ rash  due to tape, still has bruises but resolving.   -pt has received IV benadryl prn for itching at site; try to avoid if possible     Inadequate oral intake  Abnormal gut anatomy notwithstanding, it is the consensus among hospitalist and GI services that oral and/or enteral feeding should be sufficient to meet caloric and hydration needs. Has not developed refeeding syndrome. TPN will not be offered in forseeable future.  - titrate tube feeds to ~100% USRDI = 65 mL/h: reached at 60cc/hr- tolerated well.   -  Per dietitian, patient requested for cyclic feeding , started on cyclic feeding with Nutren 2.0, at goal rate 50 cc/h for 16 hours with acute 4-hour water flushes 150 cc .   - Calorie count per nutrition  - follow BMP, Mg phos q48-72, stop once at goal feeds  - GI to follow and manage wean from enteral nutrition at discharge     Elevated transaminases  Improving: Labs with AST 21, ALT 70, downtrending from prior. No evidence of cholestasis or synthetic dysfunction. Presume drug-related. No longer following closely as trend is well established.     Pouchitis (dx 03/2023), resolving  No plan for repeat endoscopy or colonoscopy during this admission, per GI, given recent exam. No significant decrement in Hb so far  - Cefdinir 300 mg daily (end = 05/05/2023)  - Completed Cefdinir 300 mg bid for 4 weeks (end = 04/07/2023)  - CBC weekly     Desmoid tumors  - okay to hold nirogesat while having difficulty with oral meds, no urgency to resume per Dr [name]     Mood / anxiety  - continue escitalopram  - lorazepam 1 mg bid prn (home medication)     Daily Checklist  - Diet: Regular diet with cyclic tube feeds  - DVT PPX: SQ enoxaparin   - GI PPX: IV protonix BID  - Access: piv    DME needs  - To be determined    DISPO: Patient to be discussed at weekly interdisciplinary team conference.   - EDD: 2/13  - Follow-up: PCP, PM&R, GI, Oncology    SUBJECTIVE:     Interval Events: NAEO. She is still having some increased tone but we will keep baclofen at current dosing as we only just started it yesterday. She is agreeable with trying baclofen for now. We discussed her EDD being 2/13. Her mother is at her side during therapies.     OBJECTIVE:     Vital signs (last 24 hours):  Temp:  [36.6 ??C (97.9 ??F)-36.7 ??C (98.1 ??F)] 36.7 ??C (98.1 ??F)  Heart Rate:  [61-82] 82  Resp:  [18] 18  BP: (98-122)/(56-59) 98/59  MAP (mmHg):  [67-70] 70  SpO2:  [98 %-100 %] 100 %    Intake/Output (last 3 shifts):  I/O last 3 completed shifts:  In: 240 [P.O.:240]  Out: -     Physical Exam:   GEN: NAD, working with OT in the gym  EYES: sclera anicteric, conjunctiva clear   HEENT: MMM, NG tube in place  NECK: trachea midline  CV: warm extremities, well perfused, no cyanosis   RESP:  NWOB on RA  ABD: Not distended  SKIN: no visible masses, lesions, rashes, ecchymoses.   MSK: no notable contractures or ext swelling  NEURO:Alert, MAE, speech fluid and coherent, responds appropriately to questions   PSYCH: mood euthymic, affect appropriate, thought process logical         Medications:  Scheduled    acetaminophen (TYLENOL) oral liquid Q8H SCH    ammonium lactate (LAC-HYDRIN) 12 % lotion 1 Application BID    buprenorphine 20 mcg/hour transdermal patch 1 patch Weekly    cefdinir (OMNICEF) oral suspension Daily    cetirizine (ZYRTEC) oral syrup BID    enoxaparin (LOVENOX) syringe 50 mg Q12H SCH    escitalopram oxalate (LEXAPRO) tablet 5 mg Daily    famotidine (PEPCID) oral suspension Nightly    fluticasone propionate (FLONASE) 50 mcg/actuation nasal spray 1 spray Weekly    guar gum (NUTRISOURCE) 1 packet TID  lidocaine (ASPERCREME) 4 % 1 patch Daily    multivitamin with folic acid 400 mcg tablet 1 tablet Daily    OLANZapine zydis (ZYPREXA) disintegrating tablet 5 mg Q PM    pantoprazole (Protonix) injection 40 mg BID    promethazine (PHENERGAN) 12.5 mg in sodium chloride 0.9 % 50 mL IVPB (premix) Q6H    scopolamine (TRANSDERM-SCOP) 1 mg over 3 days topical patch 1 mg Q72H    sodium chloride (NS) 0.9 % flush 10 mL Q8H     PRN baclofen, 5 mg, BID PRN  hydrocortisone, , BID PRN  hydrocortisone, , BID PRN  HYDROmorphone, 1 mg, Q6H PRN  hyoscyamine, 125 mcg, Q4H PRN  loperamide, 2 mg, QID PRN  LORazepam, 1 mg, BID PRN  melatonin, 3 mg, Nightly PRN  naloxegol, 12.5 mg, Daily PRN  ondansetron, 8 mg, Q8H PRN  oxyCODONE, 20 mg, Q4H PRN  zinc oxide-cod liver oil, , Daily PRN          Labs/Studies: Reviewed     Radiology Results:   XR Knee 4 Or More Views Right  Result Date: 04/13/2023  EXAM: XR KNEE 4 OR MORE VIEWS RIGHT DATE: 04/13/2023 4:18 PM ACCESSION: [id number] UN DICTATED: 04/13/2023 4:20 PM INTERPRETATION LOCATION: [facility]     CLINICAL INDICATION: 23 years old Female with He of garner syndrome. Assess for new growths    COMPARISON: Radiographs dated 11/12/2022     TECHNIQUE: AP, lateral and oblique views of the right knee.     FINDINGS: There is no change. No fracture, dislocation or lytic or blastic osseous lesion is seen.         No acute osseous abnormality. No change. Quality Indicators      Hearing, Speech, and Vision  Ability to Hear: Adequate  Ability to See in Adequate Light: Adequate  Expression of Ideas and Wants: Without difficulty  Understanding Verbal and Non-Verbal Content: Understands    Cognitive Pattern Assessment  Cognitive Pattern Assessment Used: BIMS  Brief Interview for Mental Status (BIMS)  Repetition of Three Words (First Attempt): 3  Temporal Orientation: Year: Correct  Temporal Orientation: Month: Accurate within 5 days  Temporal Orientation: Day: Correct  Recall: Sock: Yes, no cue required  Recall: Blue: Yes, no cue required  Recall: Bed: Yes, no cue required  BIMS Summary Score: 15       Nutritional Approaches  Nutritional Approach: Feeding tube    ADLs  Admission Current   Eating Assistance Needed: Set-up / clean-up    CARE Score - 5 Assistance Needed: Set-up / clean-up    CARE Score - 5   Oral Hygiene  Assistance Needed: Physical assistance Total assistance  CARE Score - 1  Assistance Needed: Physical assistance    CARE Score - 1   Bladder Continence       Bowel Continence       Toileting Hygiene Assistance Needed: Incidental touching    CARE Score - 4 Assistance Needed: Incidental touching    CARE Score - 4    Toilet Transfer Assistance Needed: Physical assistance 25% or less  CARE Score - 3 Assistance Needed: Physical assistance    CARE Score - 3   Shower/Bathe Self      CARE Score - 88       CARE Score - 88   Upper Body Dressing Assistance Needed: Physical assistance 26%-50%  CARE Score - 3  Assistance Needed: Physical assistance    CARE Score - 3   Lower Body Dressing Assistance Needed:  Physical assistance 76% or more  CARE Score - 2  Assistance Needed: Physical assistance    CARE Score - 2   On/Off Footwear Assistance Needed: Physical assistance Total assistance  CARE Score - 1  Assistance Needed: Physical assistance    CARE Score - 1         Transfers Admission Current   Bed to Chair Assistance Needed: Incidental touching    CARE Score - 4  Assistance Needed: Incidental touching    CARE Score - 4     Lying to Sitting Assistance Needed: Physical assistance 26%-50%  CARE Score - 3  Assistance Needed: Physical assistance    CARE Score - 3   Roll Left/Right Assistance Needed: Physical assistance 25% or less  CARE Score - 3  Assistance Needed: Physical assistance    CARE Score - 3   Sit to Lying Assistance Needed: Physical assistance 26%-50%  CARE Score - 3  Assistance Needed: Physical assistance    CARE Score - 3   Sit to Stand Assistance Needed: Incidental touching    CARE Score - 4  Assistance Needed: Incidental touching    CARE Score - 4         Mobility Admission Current   Walk 10 Feet Assistance Needed: Adaptive equipment, Physical assistance 25% or less  CARE Score - 3 Assistance Needed: Adaptive equipment, Physical assistance    CARE Score - 3     Walk 50 Feet 2 Turns Assistance Needed: Physical assistance 51%-75%  CARE Score - 2 Assistance Needed: Physical assistance    CARE Score - 2   Walk 150 Feet      CARE Score - 88       CARE Score - 88   Walk 10 Feet Uneven      CARE Score - 88       CARE Score - 88   1 Step (Curb)      CARE Score - 88       CARE Score - 88   4 Steps      CARE Score - 88       CARE Score - 88   12 Steps      CARE Score - 88       CARE Score - 88   Picking Up Object      CARE Score - 10       CARE Score - 10   Wheelchair/Scooter Use        Wheel 50 Feet 2 Turns Assistance Needed: Independent      CARE Score - Wheel 50 Feet with Two Turns: 6    Type of Wheelchair/Scooter: Manual Assistance Needed: Independent      CARE Score - Wheel 50 Feet with Two Turns: 6    Type of Wheelchair/Scooter: Manual   Wheel 150 Feet Assistance Needed: Physical assistance 26%-50%    CARE Score - Wheel 150 Feet: 3    Type of Wheelchair/Scooter: Manual  Assistance Needed: Physical assistance      CARE Score - Wheel 150 Feet: 3    Type of Wheelchair/Scooter: Manual

## 2023-04-16 MED ADMIN — oxyCODONE (ROXICODONE) 5 mg/5 mL solution 20 mg: 20 mg | ORAL | @ 11:00:00 | Stop: 2023-04-24

## 2023-04-16 MED ADMIN — promethazine (PHENERGAN) 12.5 mg in sodium chloride 0.9 % 50 mL IVPB (premix): 12.5 mg | INTRAVENOUS | @ 16:00:00

## 2023-04-16 MED ADMIN — lidocaine (ASPERCREME) 4 % 1 patch: 1 | TRANSDERMAL | @ 14:00:00

## 2023-04-16 MED ADMIN — oxyCODONE (ROXICODONE) 5 mg/5 mL solution 20 mg: 20 mg | ORAL | @ 16:00:00 | Stop: 2023-04-24

## 2023-04-16 MED ADMIN — ondansetron (ZOFRAN) injection 8 mg: 8 mg | INTRAVENOUS | @ 21:00:00

## 2023-04-16 MED ADMIN — oxyCODONE (ROXICODONE) 5 mg/5 mL solution 20 mg: 20 mg | ORAL | @ 22:00:00 | Stop: 2023-04-24

## 2023-04-16 MED ADMIN — HYDROmorphone (PF) (DILAUDID) injection 1 mg: 1 mg | INTRAVENOUS | @ 19:00:00 | Stop: 2023-04-22

## 2023-04-16 MED ADMIN — HYDROmorphone (PF) (DILAUDID) injection 1 mg: 1 mg | INTRAVENOUS | @ 05:00:00 | Stop: 2023-04-22

## 2023-04-16 MED ADMIN — sodium chloride (NS) 0.9 % flush 10 mL: 10 mL | INTRAVENOUS | @ 11:00:00

## 2023-04-16 MED ADMIN — baclofen (LIORESAL) tablet 5 mg: 5 mg | ORAL | @ 20:00:00

## 2023-04-16 MED ADMIN — pantoprazole (Protonix) injection 40 mg: 40 mg | INTRAVENOUS | @ 14:00:00

## 2023-04-16 MED ADMIN — guar gum (NUTRISOURCE) 1 packet: 1 | ENTERAL | @ 03:00:00

## 2023-04-16 MED ADMIN — ammonium lactate (LAC-HYDRIN) 12 % lotion 1 Application: 1 | TOPICAL | @ 14:00:00

## 2023-04-16 MED ADMIN — guar gum (NUTRISOURCE) 1 packet: 1 | ENTERAL | @ 20:00:00

## 2023-04-16 MED ADMIN — cetirizine (ZYRTEC) oral syrup: 20 mg | ENTERAL | @ 14:00:00

## 2023-04-16 MED ADMIN — enoxaparin (LOVENOX) syringe 50 mg: 1 mg/kg | SUBCUTANEOUS | @ 16:00:00

## 2023-04-16 MED ADMIN — sodium chloride (NS) 0.9 % flush 10 mL: 10 mL | INTRAVENOUS | @ 03:00:00

## 2023-04-16 MED ADMIN — multivitamin with folic acid 400 mcg tablet 1 tablet: 1 | ORAL | @ 14:00:00

## 2023-04-16 MED ADMIN — acetaminophen (TYLENOL) oral liquid: 1000 mg | ENTERAL | @ 11:00:00

## 2023-04-16 MED ADMIN — escitalopram oxalate (LEXAPRO) tablet 5 mg: 5 mg | ENTERAL | @ 14:00:00

## 2023-04-16 MED ADMIN — cefdinir (OMNICEF) oral suspension: 300 mg | ORAL | @ 14:00:00 | Stop: 2023-05-06

## 2023-04-16 MED ADMIN — acetaminophen (TYLENOL) oral liquid: 1000 mg | ENTERAL | @ 03:00:00

## 2023-04-16 MED ADMIN — HYDROmorphone (PF) (DILAUDID) injection 1 mg: 1 mg | INTRAVENOUS | @ 12:00:00 | Stop: 2023-04-22

## 2023-04-16 MED ADMIN — famotidine (PEPCID) oral suspension: 20 mg | ENTERAL | @ 03:00:00

## 2023-04-16 MED ADMIN — promethazine (PHENERGAN) 12.5 mg in sodium chloride 0.9 % 50 mL IVPB (premix): 12.5 mg | INTRAVENOUS | @ 11:00:00

## 2023-04-16 MED ADMIN — oxyCODONE (ROXICODONE) 5 mg/5 mL solution 20 mg: 20 mg | ORAL | @ 03:00:00 | Stop: 2023-04-19

## 2023-04-16 MED ADMIN — scopolamine (TRANSDERM-SCOP) 1 mg over 3 days topical patch 1 mg: 1 | TOPICAL | @ 14:00:00

## 2023-04-16 MED ADMIN — guar gum (NUTRISOURCE) 1 packet: 1 | ENTERAL | @ 14:00:00

## 2023-04-16 MED ADMIN — enoxaparin (LOVENOX) syringe 50 mg: 1 mg/kg | SUBCUTANEOUS | @ 03:00:00

## 2023-04-16 MED ADMIN — promethazine (PHENERGAN) 12.5 mg in sodium chloride 0.9 % 50 mL IVPB (premix): 12.5 mg | INTRAVENOUS | @ 22:00:00

## 2023-04-16 MED ADMIN — sodium chloride (NS) 0.9 % flush 10 mL: 10 mL | INTRAVENOUS | @ 20:00:00

## 2023-04-16 MED ADMIN — acetaminophen (TYLENOL) oral liquid: 1000 mg | ENTERAL | @ 20:00:00

## 2023-04-16 MED ADMIN — promethazine (PHENERGAN) 12.5 mg in sodium chloride 0.9 % 50 mL IVPB (premix): 12.5 mg | INTRAVENOUS | @ 05:00:00

## 2023-04-16 MED ADMIN — pantoprazole (Protonix) injection 40 mg: 40 mg | INTRAVENOUS | @ 03:00:00

## 2023-04-16 NOTE — Unmapped (Signed)
No acute events overnight. Patient is alert and oriented. Patient is tolerating cyclic tube feed (4pm-8am) with Nutren 2.0 at the rate of 78ml/hr and 75ml flush every three hours. Left forearm PIV flushes well. Most meds given via L nare NG tube and a few via IV. Pain continuous to be managed with scheduled pain medications and PRN Dilaudid and oxycodone. Nausea managed with scheduled Phenergan . No skin changes noted. Safety precautions maintained. Mother at bedside and involved in patient care. Resting quietly in bed without new concerns at this time. Will continue to monitor and give report to next shift.        Problem: Rehabilitation (IRF) Plan of Care  Goal: Absence of New-Onset Illness or Injury  Intervention: Prevent Fall and Fall Injury  Recent Flowsheet Documentation  Taken 04/16/2023 0400 by Rudell Cobb, RN  Safety Interventions:   environmental modification   fall reduction program maintained  Taken 04/16/2023 0200 by Rudell Cobb, RN  Safety Interventions: fall reduction program maintained  Taken 04/16/2023 0004 by Rudell Cobb, RN  Safety Interventions:   environmental modification   fall reduction program maintained  Taken 04/15/2023 2200 by Rudell Cobb, RN  Safety Interventions: fall reduction program maintained  Taken 04/15/2023 2000 by Rudell Cobb, RN  Safety Interventions: fall reduction program maintained  Intervention: Prevent Skin Injury  Recent Flowsheet Documentation  Taken 04/16/2023 0400 by Rudell Cobb, RN  Device Skin Pressure Protection: absorbent pad utilized/changed  Skin Protection: incontinence pads utilized  Taken 04/16/2023 0200 by Rudell Cobb, RN  Device Skin Pressure Protection: absorbent pad utilized/changed  Taken 04/16/2023 0004 by Rudell Cobb, RN  Device Skin Pressure Protection: absorbent pad utilized/changed  Skin Protection: incontinence pads utilized  Taken 04/15/2023 2300 by Rudell Cobb, RN  Device Skin Pressure Protection: absorbent pad utilized/changed  Skin Protection: incontinence pads utilized  Taken 04/15/2023 2200 by Rudell Cobb, RN  Device Skin Pressure Protection: absorbent pad utilized/changed  Skin Protection: incontinence pads utilized  Taken 04/15/2023 2000 by Rudell Cobb, RN  Device Skin Pressure Protection: absorbent pad utilized/changed  Skin Protection: incontinence pads utilized  Intervention: Prevent Infection  Recent Flowsheet Documentation  Taken 04/16/2023 0004 by Rudell Cobb, RN  Infection Prevention:   hand hygiene promoted   rest/sleep promoted  Taken 04/15/2023 2200 by Rudell Cobb, RN  Infection Prevention:   hand hygiene promoted   rest/sleep promoted  Taken 04/15/2023 2000 by Rudell Cobb, RN  Infection Prevention:   hand hygiene promoted   rest/sleep promoted  Intervention: Prevent VTE (Venous Thromboembolism)  Recent Flowsheet Documentation  Taken 04/16/2023 0400 by Rudell Cobb, RN  Anti-Embolism Device Status: Other (Comment)  Taken 04/16/2023 0200 by Rudell Cobb, RN  Anti-Embolism Device Status: Other (Comment)  Taken 04/16/2023 0004 by Rudell Cobb, RN  Anti-Embolism Device Status: Other (Comment)  Taken 04/15/2023 2300 by Rudell Cobb, RN  VTE Prevention/Management: anticoagulant therapy  Taken 04/15/2023 2200 by Rudell Cobb, RN  Anti-Embolism Device Status: Other (Comment)  Taken 04/15/2023 2000 by Rudell Cobb, RN  Anti-Embolism Device Status: Other (Comment)     Problem: Rehabilitation (IRF) Plan of Care  Goal: Absence of New-Onset Illness or Injury  Intervention: Prevent Skin Injury  Recent Flowsheet Documentation  Taken 04/16/2023 0400 by Rudell Cobb, RN  Device Skin Pressure Protection: absorbent pad utilized/changed  Skin Protection: incontinence pads utilized  Taken 04/16/2023 0200 by Rudell Cobb, RN  Device Skin Pressure Protection: absorbent pad utilized/changed  Taken 04/16/2023 0004 by Rudell Cobb, RN  Device Skin Pressure Protection: absorbent pad utilized/changed  Skin Protection: incontinence pads utilized  Taken 04/15/2023 2300 by Rudell Cobb, RN  Device Skin Pressure Protection: absorbent pad utilized/changed  Skin Protection: incontinence pads utilized  Taken 04/15/2023 2200 by Rudell Cobb, RN  Device Skin Pressure Protection: absorbent pad utilized/changed  Skin Protection: incontinence pads utilized  Taken 04/15/2023 2000 by Rudell Cobb, RN  Device Skin Pressure Protection: absorbent pad utilized/changed  Skin Protection: incontinence pads utilized     Problem: Rehabilitation (IRF) Plan of Care  Goal: Absence of New-Onset Illness or Injury  Intervention: Prevent VTE (Venous Thromboembolism)  Recent Flowsheet Documentation  Taken 04/16/2023 0400 by Rudell Cobb, RN  Anti-Embolism Device Status: Other (Comment)  Taken 04/16/2023 0200 by Rudell Cobb, RN  Anti-Embolism Device Status: Other (Comment)  Taken 04/16/2023 0004 by Rudell Cobb, RN  Anti-Embolism Device Status: Other (Comment)  Taken 04/15/2023 2300 by Rudell Cobb, RN  VTE Prevention/Management: anticoagulant therapy  Taken 04/15/2023 2200 by Rudell Cobb, RN  Anti-Embolism Device Status: Other (Comment)  Taken 04/15/2023 2000 by Rudell Cobb, RN  Anti-Embolism Device Status: Other (Comment)     Problem: Self-Care Deficit  Goal: Improved Ability to Complete Activities of Daily Living  Intervention: Promote Activity and Functional Independence  Recent Flowsheet Documentation  Taken 04/15/2023 2300 by Rudell Cobb, RN  Self-Care Promotion: independence encouraged     Problem: Fall Injury Risk  Goal: Absence of Fall and Fall-Related Injury  Intervention: Identify and Manage Contributors  Recent Flowsheet Documentation  Taken 04/15/2023 2300 by Rudell Cobb, RN  Self-Care Promotion: independence encouraged  Intervention: Promote Injury-Free Environment  Recent Flowsheet Documentation  Taken 04/16/2023 0400 by Rudell Cobb, RN  Safety Interventions:   environmental modification   fall reduction program maintained  Taken 04/16/2023 0200 by Rudell Cobb, RN  Safety Interventions: fall reduction program maintained  Taken 04/16/2023 0004 by Rudell Cobb, RN  Safety Interventions:   environmental modification   fall reduction program maintained  Taken 04/15/2023 2200 by Rudell Cobb, RN  Safety Interventions: fall reduction program maintained  Taken 04/15/2023 2000 by Rudell Cobb, RN  Safety Interventions: fall reduction program maintained

## 2023-04-16 NOTE — Unmapped (Signed)
Physical Medicine and Rehabilitation  Daily Progress Note [facility]    ASSESSMENT:     [name] [name] is a 23 y.o. female 23 y.o. female with PMH of [name] s/p proctocolectomy and J-pouch, desmoid tumours, chronic abdominal pain and nausea, anxiety, PTSD related to past medical care, DVT RUE s/p Eliquis x3 months admitted for constellation of worsening pain, intolerance of oral intake. She is now admitted to [facility] for comprehensive interdisciplinary rehabilitation.      Rehab Impairment Group Code [facility]):   (Debility) 16 Debility (Non-Cardiac/Non-Pulmonary)   Etiology: worsening pain, intolerance of oral intake     PLAN:     This patient is admitted to the Physical Medicine and Rehabilitation - Inpatient - A service from 8am-5pm on weekdays for questions regarding this patient. After hours, weekends, and holidays please contact the 1st Call resident pager     REHAB:   - PT and OT to maximize functional status with mobility and ADLs as well as prevention of joint contracture.   - P&O for assistive devices PRN.  - To be discussed in weekly Interdisciplinary Team Conference.     Acute on chronic abdominal pain and nausea  Complex situation discussed at length in prior notes. In brief, her pain and nausea are (deeply) multifactorial but consensus among treatment team is that new / unknown organic issue is unlikely. Plan to focus on symptom control, adequate nutrition.  Patient is currently at goal tube feeding, tolerating well.  No visible emesis noted.  Still reports persistent nausea but on aggressive nausea management as below. Chronic pain service consulted, appreciated.  - NO CHANGES to pain regimen without multiparty discussion including patient, unless true emergency.  Seen by chronic pain 1/28, recommended to continue the pain regimen as below no changes made.  - Pain management:  -- oxycodone (liquid) 20 mg q4 prn-first-line for pain  -- changed hydromorphone 1 mg IV q6prn -second line for pain  (ONLY IF inadequate relief 1 hour after oral alternative)  -- buprenorphine 20 mcg/h patch   -- hyoscyamine prn  -- Baclofen 5mg  BID PRN  - Nausea: discuss about transitioning one of the IV nausea medication to p.o.  -- ondansetron IV q8 PRN  -- promethazine IV q6   -- scopolamine patch  -- olanzapine 5 mg in evening  -- no diphenhydramine IV in absence of clear indication  - Dyspepsia:  -- famotidine bid  -- PPI IV q12  - GI motility:  -- naloxegol prn  -- okay to use loperamide sparingly     Left arm PICC line site swelling, pain and ecchymosis: Left arm PICC line site noted mild swelling and mild ecchymosis from insertion.  No visible swelling noted below the elbow.  PVL confirmed acute DVT, started on anticoagulation and removed PICC line on 1/28, given acute DVT also patient is currently not on any IV antibiotic. Removed PICC line 1/28,  swelling around prior picc line site improving, mild redness/ rash  due to tape, still has bruises but resolving.   -pt has received IV benadryl prn for itching at site; try to avoid if possible     Inadequate oral intake  Abnormal gut anatomy notwithstanding, it is the consensus among hospitalist and GI services that oral and/or enteral feeding should be sufficient to meet caloric and hydration needs. Has not developed refeeding syndrome. TPN will not be offered in forseeable future.  - titrate tube feeds to ~100% USRDI = 65 mL/h: reached at 60cc/hr- tolerated well.   -  Per dietitian, patient requested for cyclic feeding , started on cyclic feeding with Nutren 2.0, at goal rate 50 cc/h for 16 hours with acute 4-hour water flushes 150 cc .   - Calorie count per nutrition  - follow BMP, Mg phos q48-72, stop once at goal feeds  - GI to follow and manage wean from enteral nutrition at discharge     Elevated transaminases  Improving: Labs with AST 21, ALT 70, downtrending from prior. No evidence of cholestasis or synthetic dysfunction. Presume drug-related. No longer following closely as trend is well established.     Pouchitis (dx 03/2023), resolving  No plan for repeat endoscopy or colonoscopy during this admission, per GI, given recent exam. No significant decrement in Hb so far  - Cefdinir 300 mg daily (end = 05/05/2023)  - Completed Cefdinir 300 mg bid for 4 weeks (end = 04/07/2023)  - CBC weekly     Desmoid tumors  - okay to hold nirogesat while having difficulty with oral meds, no urgency to resume per Dr [name]     Mood / anxiety  - continue escitalopram  - lorazepam 1 mg bid prn (home medication)     Daily Checklist  - Diet: Regular diet with cyclic tube feeds  - DVT PPX: SQ enoxaparin   - GI PPX: IV protonix BID  - Access: piv    DME needs  - To be determined    DISPO: Patient to be discussed at weekly interdisciplinary team conference.   - EDD: 2/13  - Follow-up: PCP, PM&R, GI, Oncology    SUBJECTIVE:     Interval Events: NAEO. She continues to report stiffness so increased the baclofen to 5mg  tid and scheduled it as a medication. She continues to perseverate on her arm weakness and I encouraged her to not compare her AIR admission this time to earlier admissions as each one is a do-over. We also discussed other resources we could bring to bear on her case and she was in agreement with getting rec therapy and rehab counceling involved.   OBJECTIVE:     Vital signs (last 24 hours):  Temp:  [36.6 ??C (97.9 ??F)] 36.6 ??C (97.9 ??F)  Heart Rate:  [79] 79  Resp:  [18] 18  BP: (101-112)/(53-63) 112/63  MAP (mmHg):  [67-77] 77  SpO2:  [97 %-98 %] 98 %    Intake/Output (last 3 shifts):  I/O last 3 completed shifts:  In: 40 [NG/GT:40]  Out: -     Physical Exam:   GEN: NAD, working with PT in the gym  EYES: sclera anicteric, conjunctiva clear   HEENT: MMM, NG tube in place  NECK: trachea midline  CV: warm extremities, well perfused, no cyanosis   RESP:  NWOB on RA  ABD: Not distended  SKIN: no visible masses, lesions, rashes, ecchymoses.   MSK: no notable contractures or ext swelling  NEURO:Alert, MAE, speech fluid and coherent, responds appropriately to questions   PSYCH: mood euthymic, affect appropriate, thought process logical         Medications:  Scheduled    acetaminophen (TYLENOL) oral liquid Q8H SCH    ammonium lactate (LAC-HYDRIN) 12 % lotion 1 Application BID    buprenorphine 20 mcg/hour transdermal patch 1 patch Weekly    cefdinir (OMNICEF) oral suspension Daily    cetirizine (ZYRTEC) oral syrup BID    enoxaparin (LOVENOX) syringe 50 mg Q12H SCH    escitalopram oxalate (LEXAPRO) tablet 5 mg Daily    famotidine (PEPCID) oral  suspension Nightly    fluticasone propionate (FLONASE) 50 mcg/actuation nasal spray 1 spray Weekly    guar gum (NUTRISOURCE) 1 packet TID    lidocaine (ASPERCREME) 4 % 1 patch Daily    multivitamin with folic acid 400 mcg tablet 1 tablet Daily    OLANZapine zydis (ZYPREXA) disintegrating tablet 5 mg Q PM    pantoprazole (Protonix) injection 40 mg BID    promethazine (PHENERGAN) 12.5 mg in sodium chloride 0.9 % 50 mL IVPB (premix) Q6H    scopolamine (TRANSDERM-SCOP) 1 mg over 3 days topical patch 1 mg Q72H    sodium chloride (NS) 0.9 % flush 10 mL Q8H     PRN hydrocortisone, , BID PRN  hydrocortisone, , BID PRN  HYDROmorphone, 1 mg, Q6H PRN  hyoscyamine, 125 mcg, Q4H PRN  loperamide, 2 mg, QID PRN  LORazepam, 1 mg, BID PRN  melatonin, 3 mg, Nightly PRN  naloxegol, 12.5 mg, Daily PRN  ondansetron, 8 mg, Q8H PRN  oxyCODONE, 20 mg, Q4H PRN  zinc oxide-cod liver oil, , Daily PRN          Labs/Studies: Reviewed     Radiology Results:   No results found.        Quality Indicators      Hearing, Speech, and Vision  Ability to Hear: Adequate  Ability to See in Adequate Light: Adequate  Expression of Ideas and Wants: Without difficulty  Understanding Verbal and Non-Verbal Content: Understands    Cognitive Pattern Assessment  Cognitive Pattern Assessment Used: BIMS  Brief Interview for Mental Status (BIMS)  Repetition of Three Words (First Attempt): 3  Temporal Orientation: Year: Correct  Temporal Orientation: Month: Accurate within 5 days  Temporal Orientation: Day: Correct  Recall: Sock: Yes, no cue required  Recall: Blue: Yes, no cue required  Recall: Bed: Yes, no cue required  BIMS Summary Score: 15       Nutritional Approaches  Nutritional Approach: Feeding tube    ADLs  Admission Current   Eating Assistance Needed: Set-up / clean-up    CARE Score - 5 Assistance Needed: Set-up / clean-up    CARE Score - 5   Oral Hygiene  Assistance Needed: Physical assistance Total assistance  CARE Score - 1  Assistance Needed: Physical assistance    CARE Score - 1   Bladder Continence       Bowel Continence       Toileting Hygiene Assistance Needed: Incidental touching    CARE Score - 4 Assistance Needed: Incidental touching    CARE Score - 4    Toilet Transfer Assistance Needed: Physical assistance 25% or less  CARE Score - 3 Assistance Needed: Physical assistance    CARE Score - 3   Shower/Bathe Self      CARE Score - 88       CARE Score - 88   Upper Body Dressing Assistance Needed: Physical assistance 26%-50%  CARE Score - 3  Assistance Needed: Independent    CARE Score - 6   Lower Body Dressing Assistance Needed: Physical assistance 76% or more  CARE Score - 2  Assistance Needed: Physical assistance    CARE Score - 2   On/Off Footwear Assistance Needed: Physical assistance Total assistance  CARE Score - 1  Assistance Needed: Physical assistance    CARE Score - 1         Transfers Admission Current   Bed to Chair Assistance Needed: Incidental touching    CARE Score - 4  Assistance  Needed: Incidental touching    CARE Score - 4     Lying to Sitting Assistance Needed: Physical assistance 26%-50%  CARE Score - 3  Assistance Needed: Physical assistance    CARE Score - 3   Roll Left/Right Assistance Needed: Physical assistance 25% or less  CARE Score - 3  Assistance Needed: Physical assistance    CARE Score - 3   Sit to Lying Assistance Needed: Physical assistance 26%-50%  CARE Score - 3  Assistance Needed: Physical assistance    CARE Score - 3   Sit to Stand Assistance Needed: Incidental touching    CARE Score - 4  Assistance Needed: Incidental touching    CARE Score - 4         Mobility Admission Current   Walk 10 Feet Assistance Needed: Adaptive equipment, Physical assistance 25% or less  CARE Score - 3 Assistance Needed: Adaptive equipment, Physical assistance    CARE Score - 3     Walk 50 Feet 2 Turns Assistance Needed: Physical assistance 51%-75%  CARE Score - 2 Assistance Needed: Physical assistance    CARE Score - 2   Walk 150 Feet      CARE Score - 88       CARE Score - 88   Walk 10 Feet Uneven      CARE Score - 88       CARE Score - 88   1 Step (Curb)      CARE Score - 88       CARE Score - 88   4 Steps      CARE Score - 88       CARE Score - 88   12 Steps      CARE Score - 88       CARE Score - 88   Picking Up Object      CARE Score - 10       CARE Score - 10   Wheelchair/Scooter Use        Wheel 50 Feet 2 Turns Assistance Needed: Independent      CARE Score - Wheel 50 Feet with Two Turns: 6    Type of Wheelchair/Scooter: Manual Assistance Needed: Independent      CARE Score - Wheel 50 Feet with Two Turns: 6    Type of Wheelchair/Scooter: Manual   Wheel 150 Feet Assistance Needed: Physical assistance 26%-50%    CARE Score - Wheel 150 Feet: 3    Type of Wheelchair/Scooter: Manual  Assistance Needed: Physical assistance      CARE Score - Wheel 150 Feet: 3    Type of Wheelchair/Scooter: Manual

## 2023-04-17 LAB — CBC
HEMATOCRIT: 34 % (ref 34.0–44.0)
HEMOGLOBIN: 11.3 g/dL (ref 11.3–14.9)
MEAN CORPUSCULAR HEMOGLOBIN CONC: 33.3 g/dL (ref 32.0–36.0)
MEAN CORPUSCULAR HEMOGLOBIN: 27.9 pg (ref 25.9–32.4)
MEAN CORPUSCULAR VOLUME: 83.9 fL (ref 77.6–95.7)
MEAN PLATELET VOLUME: 9.8 fL (ref 6.8–10.7)
PLATELET COUNT: 214 10*9/L (ref 150–450)
RED BLOOD CELL COUNT: 4.05 10*12/L (ref 3.95–5.13)
RED CELL DISTRIBUTION WIDTH: 17.7 % — ABNORMAL HIGH (ref 12.2–15.2)
WBC ADJUSTED: 6.8 10*9/L (ref 3.6–11.2)

## 2023-04-17 LAB — PHOSPHORUS: PHOSPHORUS: 4.8 mg/dL (ref 2.4–5.1)

## 2023-04-17 LAB — BASIC METABOLIC PANEL
ANION GAP: 13 mmol/L (ref 5–14)
BLOOD UREA NITROGEN: 13 mg/dL (ref 9–23)
BUN / CREAT RATIO: 30
CALCIUM: 9.7 mg/dL (ref 8.7–10.4)
CHLORIDE: 101 mmol/L (ref 98–107)
CO2: 28.9 mmol/L (ref 20.0–31.0)
CREATININE: 0.43 mg/dL — ABNORMAL LOW (ref 0.55–1.02)
EGFR CKD-EPI (2021) FEMALE: >90 mL/min/{1.73_m2} (ref >=60)
GLUCOSE RANDOM: 119 mg/dL (ref 70–179)
POTASSIUM: 3.9 mmol/L (ref 3.4–4.8)
SODIUM: 143 mmol/L (ref 135–145)

## 2023-04-17 LAB — MAGNESIUM: MAGNESIUM: 1.9 mg/dL (ref 1.6–2.6)

## 2023-04-17 MED ADMIN — promethazine (PHENERGAN) 12.5 mg in sodium chloride 0.9 % 50 mL IVPB (premix): 12.5 mg | INTRAVENOUS | @ 05:00:00

## 2023-04-17 MED ADMIN — promethazine (PHENERGAN) 12.5 mg in sodium chloride 0.9 % 50 mL IVPB (premix): 12.5 mg | INTRAVENOUS | @ 17:00:00

## 2023-04-17 MED ADMIN — acetaminophen (TYLENOL) oral liquid: 1000 mg | ENTERAL | @ 11:00:00

## 2023-04-17 MED ADMIN — oxyCODONE (ROXICODONE) 5 mg/5 mL solution 20 mg: 20 mg | ORAL | @ 03:00:00 | Stop: 2023-04-24

## 2023-04-17 MED ADMIN — HYDROmorphone (PF) (DILAUDID) injection 1 mg: 1 mg | INTRAVENOUS | @ 01:00:00 | Stop: 2023-04-22

## 2023-04-17 MED ADMIN — baclofen (LIORESAL) tablet 5 mg: 5 mg | ORAL | @ 21:00:00

## 2023-04-17 MED ADMIN — pantoprazole (Protonix) injection 40 mg: 40 mg | INTRAVENOUS | @ 17:00:00

## 2023-04-17 MED ADMIN — HYDROmorphone (PF) (DILAUDID) injection 1 mg: 1 mg | INTRAVENOUS | @ 11:00:00 | Stop: 2023-04-22

## 2023-04-17 MED ADMIN — sodium chloride (NS) 0.9 % flush 10 mL: 10 mL | INTRAVENOUS | @ 04:00:00

## 2023-04-17 MED ADMIN — HYDROmorphone (PF) (DILAUDID) injection 1 mg: 1 mg | INTRAVENOUS | @ 23:00:00 | Stop: 2023-04-22

## 2023-04-17 MED ADMIN — enoxaparin (LOVENOX) syringe 50 mg: 1 mg/kg | SUBCUTANEOUS | @ 14:00:00 | Stop: 2023-04-17

## 2023-04-17 MED ADMIN — baclofen (LIORESAL) tablet 5 mg: 5 mg | ORAL | @ 03:00:00

## 2023-04-17 MED ADMIN — ammonium lactate (LAC-HYDRIN) 12 % lotion 1 Application: 1 | TOPICAL | @ 14:00:00

## 2023-04-17 MED ADMIN — guar gum (NUTRISOURCE) 1 packet: 1 | ENTERAL | @ 21:00:00

## 2023-04-17 MED ADMIN — baclofen (LIORESAL) tablet 5 mg: 5 mg | ORAL | @ 14:00:00

## 2023-04-17 MED ADMIN — escitalopram oxalate (LEXAPRO) tablet 5 mg: 5 mg | ENTERAL | @ 14:00:00

## 2023-04-17 MED ADMIN — cetirizine (ZYRTEC) oral syrup: 20 mg | ENTERAL | @ 14:00:00

## 2023-04-17 MED ADMIN — sodium chloride (NS) 0.9 % flush 10 mL: 10 mL | INTRAVENOUS | @ 21:00:00

## 2023-04-17 MED ADMIN — ondansetron (ZOFRAN) injection 8 mg: 8 mg | INTRAVENOUS | @ 14:00:00

## 2023-04-17 MED ADMIN — oxyCODONE (ROXICODONE) 5 mg/5 mL solution 20 mg: 20 mg | ORAL | @ 21:00:00 | Stop: 2023-04-24

## 2023-04-17 MED ADMIN — guar gum (NUTRISOURCE) 1 packet: 1 | ENTERAL | @ 03:00:00

## 2023-04-17 MED ADMIN — cefdinir (OMNICEF) oral suspension: 300 mg | ORAL | @ 14:00:00 | Stop: 2023-05-06

## 2023-04-17 MED ADMIN — sodium chloride (NS) 0.9 % flush 10 mL: 10 mL | INTRAVENOUS | @ 12:00:00

## 2023-04-17 MED ADMIN — enoxaparin (LOVENOX) syringe 50 mg: 1 mg/kg | SUBCUTANEOUS | @ 04:00:00

## 2023-04-17 MED ADMIN — pantoprazole (Protonix) injection 40 mg: 40 mg | INTRAVENOUS | @ 03:00:00

## 2023-04-17 MED ADMIN — acetaminophen (TYLENOL) oral liquid: 1000 mg | ENTERAL | @ 03:00:00

## 2023-04-17 MED ADMIN — promethazine (PHENERGAN) 12.5 mg in sodium chloride 0.9 % 50 mL IVPB (premix): 12.5 mg | INTRAVENOUS | @ 23:00:00

## 2023-04-17 MED ADMIN — promethazine (PHENERGAN) 12.5 mg in sodium chloride 0.9 % 50 mL IVPB (premix): 12.5 mg | INTRAVENOUS | @ 11:00:00

## 2023-04-17 MED ADMIN — lidocaine (ASPERCREME) 4 % 1 patch: 1 | TRANSDERMAL | @ 14:00:00

## 2023-04-17 MED ADMIN — acetaminophen (TYLENOL) oral liquid: 1000 mg | ENTERAL | @ 21:00:00

## 2023-04-17 MED ADMIN — ammonium lactate (LAC-HYDRIN) 12 % lotion 1 Application: 1 | TOPICAL | @ 02:00:00

## 2023-04-17 MED ADMIN — famotidine (PEPCID) oral suspension: 20 mg | ENTERAL | @ 03:00:00

## 2023-04-17 MED ADMIN — HYDROmorphone (PF) (DILAUDID) injection 1 mg: 1 mg | INTRAVENOUS | @ 17:00:00 | Stop: 2023-04-22

## 2023-04-17 MED ADMIN — oxyCODONE (ROXICODONE) 5 mg/5 mL solution 20 mg: 20 mg | ORAL | @ 14:00:00 | Stop: 2023-04-24

## 2023-04-17 MED ADMIN — guar gum (NUTRISOURCE) 1 packet: 1 | ENTERAL | @ 14:00:00

## 2023-04-17 NOTE — Unmapped (Signed)
Patient is seen in consultation at the request of Ronelle Nigh, MD with [facility]) for possible placement of a central venous access device.    Assessment & Recommendations     [name] [name] is a 23 y.o. female admitted for constellation of worsening pain, intolerance of oral intake. Primary team has consulted VIR for consideration of port placement.     - Primary team to consult with oncology team for determination of specific venous access needs (dependent on expected duration of therapy, frequency of access needed). Please re-engage VIR once discussion on specific venous access needs has been finalized.  - If intermittent, long term IV access is needed (</= 2 days/week accessed), implantable port placement is reasonable.  - If the patient needs access for continue use during frequent hospitalizations (requiring daily line access for fluids and/or IV meds), a tunneled CVAD is recommended. She also has required TPN in the past, which would necessitate a multi-lumen line if there is consideration of future need for this.   - Expected discharge date: 2/13  - Iodinated contrast allergy: No.  - Antibiotics needed: No  - Pending blood cultures: No. Per [facility] policy, blood cultures must be negative for 48 hours prior to placing a tunneled central venous catheter.    Thank you for involving [name] in the care of this patient. Please page the VIR consult pager [phone]) with further questions, concerns, or if new issues arise.     Subjective     History of Present Illness:  [name] [name] is a 23 y.o. female with PMH of [name] s/p proctocolectomy and J-pouch, desmoid tumours, chronic abdominal pain and nausea, anxiety, PTSD related to past medical care, DVT RUE s/p Eliquis x3 months admitted 2/1 for constellation of worsening pain, intolerance of oral intake. She is now admitted to [facility] for comprehensive interdisciplinary rehabilitation. VIR has been consulted by the primary team for long-term vascular access needs. Per team and oncology, patient requires frequent admissions where she receives IV fluids and other IV medications. She has prior history of tunneled DL Powerline and has also required TPN in the past. Previous blood cultures positive for streptococcus (03/25/23). Had candidemia in 10/2022.     She is afebrile, VSS on room air. She is without leukocytosis, no pending blood cultures. Previously had LUE PICC line, placed 1/19 and then removed on 1/28 d/t swelling and PVL showing acute DVT. She had prior tunneled Powerlines in the [facility] without any documented access issues. Last one was removed in August d/t her candidemia.      Review of Systems: Pertinent items are noted in HPI.    Past Medical History:  Past Medical History:   Diagnosis Date    Abdominal pain     Acid reflux     occas    Anesthesia complication     itching, shaking, coldness; last few surgeries have gone much better    Cancer (CMS-HCC)     Cataract of right eye     COVID-19 virus infection 01/2019    Cyst of thyroid determined by ultrasound     monitoring    [name]     2 right forearm, 1 left thigh, 1 right scapula, 1 under left clavicle; multiple    Difficult intravenous access     [name] (familial adenomatous polyposis)     [name]     Gastric polyps     History of chemotherapy     last treatment  approx 05/2019    History of colon polyps     History of COVID-19 01/2019    Ileus (CMS-HCC) 03/16/2022    Iron deficiency anemia due to chronic blood loss     received iron infusion 11-2019    PONV (postoperative nausea and vomiting)     Rectal bleeding     Syncopal episodes     especially if becoming dehydrated     Past Surgical History:  Past Surgical History:   Procedure Laterality Date    COLON SURGERY      cyroablation      cystis removal      desmoid removal      PR CLOSE ENTEROSTOMY,RESEC+ANAST N/A 10/09/2020    Procedure: ILEOSTOMY TAKEDOWN;  Surgeon: Mickle Asper, MD;  Location: OR [place];  Service: General Surgery    PR COLONOSCOPY W/BIOPSY SINGLE/MULTIPLE N/A 10/27/2012    Procedure: COLONOSCOPY, FLEXIBLE, PROXIMAL TO SPLENIC FLEXURE; WITH BIOPSY, SINGLE OR MULTIPLE;  Surgeon: Shirlyn Goltz Mir, MD;  Location: PEDS PROCEDURE ROOM [facility];  Service: Gastroenterology    PR COLONOSCOPY W/BIOPSY SINGLE/MULTIPLE N/A 09/14/2013    Procedure: COLONOSCOPY, FLEXIBLE, PROXIMAL TO SPLENIC FLEXURE; WITH BIOPSY, SINGLE OR MULTIPLE;  Surgeon: Shirlyn Goltz Mir, MD;  Location: PEDS PROCEDURE ROOM [place];  Service: Gastroenterology    PR COLONOSCOPY W/BIOPSY SINGLE/MULTIPLE N/A 11/08/2014    Procedure: COLONOSCOPY, FLEXIBLE, PROXIMAL TO SPLENIC FLEXURE; WITH BIOPSY, SINGLE OR MULTIPLE;  Surgeon: Arnold Long Mir, MD;  Location: PEDS PROCEDURE ROOM [facility];  Service: Gastroenterology    PR COLONOSCOPY W/BIOPSY SINGLE/MULTIPLE N/A 12/26/2015    Procedure: COLONOSCOPY, FLEXIBLE, PROXIMAL TO SPLENIC FLEXURE; WITH BIOPSY, SINGLE OR MULTIPLE;  Surgeon: Arnold Long Mir, MD;  Location: PEDS PROCEDURE ROOM [facility];  Service: Gastroenterology    PR COLONOSCOPY W/BIOPSY SINGLE/MULTIPLE N/A 09/02/2017    Procedure: COLONOSCOPY, FLEXIBLE, PROXIMAL TO SPLENIC FLEXURE; WITH BIOPSY, SINGLE OR MULTIPLE;  Surgeon: Arnold Long Mir, MD;  Location: PEDS PROCEDURE ROOM [place];  Service: Gastroenterology    PR COLSC FLX W/REMOVAL LESION BY HOT BX FORCEPS N/A 08/27/2016    Procedure: COLONOSCOPY, FLEXIBLE, PROXIMAL TO SPLENIC FLEXURE; W/REMOVAL TUMOR/POLYP/OTHER LESION, HOT BX FORCEP/CAUTE;  Surgeon: Arnold Long Mir, MD;  Location: PEDS PROCEDURE ROOM [facility];  Service: Gastroenterology    PR COLSC FLX W/RMVL OF TUMOR POLYP LESION SNARE TQ N/A 02/25/2019    Procedure: COLONOSCOPY FLEX; W/REMOV TUMOR/LES BY SNARE;  Surgeon: Helyn Numbers, MD;  Location: [facility] PROCEDURES [place] [facility];  Service: Gastroenterology    PR COLSC FLX W/RMVL OF TUMOR POLYP LESION SNARE TQ N/A 03/13/2020    Procedure: COLONOSCOPY FLEX; W/REMOV TUMOR/LES BY SNARE;  Surgeon: Helyn Numbers, MD;  Location: [facility] PROCEDURES [place] [facility];  Service: Gastroenterology    PR EXC SKIN BENIG 2.1-3 CM TRUNK,ARM,LEG Right 02/25/2017    Procedure: EXCISION, BENIGN LESION INCLUDE MARGINS, EXCEPT SKIN TAG, LEGS; EXCISED DIAMETER 2.1 TO 3.0 CM;  Surgeon: Clarene Duke, MD;  Location: [facility]ILDRENS OR [facility];  Service: Plastics    PR EXC SKIN BENIG 3.1-4 CM TRUNK,ARM,LEG Right 02/25/2017    Procedure: EXCISION, BENIGN LESION INCLUDE MARGINS, EXCEPT SKIN TAG, ARMS; EXCISED DIAMETER 3.1 TO 4.0 CM;  Surgeon: Clarene Duke, MD;  Location: CHILDRENS OR [facility];  Service: Plastics    PR EXC SKIN BENIG >4 CM FACE,FACIAL Right 02/25/2017    Procedure: EXCISION, OTHER BENIGN LES INCLUD MARGIN, FACE/EARS/EYELIDS/NOSE/LIPS/MUCOUS MEMBRANE; EXCISED DIAM >4.0 CM;  Surgeon: Clarene Duke, MD;  Location: CHILDRENS OR [facility];  Service: Plastics    PR EXC TUMOR SOFT  TISSUE LEG/ANKLE SUBQ 3+CM Right 08/05/2019    Procedure: EXCISION, TUMOR, SOFT TISSUE OF LEG OR ANKLE AREA, SUBCUTANEOUS; 3 CM OR GREATER;  Surgeon: Arsenio Katz, MD;  Location: MAIN OR [place];  Service: Plastics    PR EXC TUMOR SOFT TISSUE LEG/ANKLE SUBQ <3CM Right 08/05/2019    Procedure: EXCISION, TUMOR, SOFT TISSUE OF LEG OR ANKLE AREA, SUBCUTANEOUS; LESS THAN 3 CM;  Surgeon: Arsenio Katz, MD;  Location: MAIN OR [facility];  Service: Plastics    PR LAP, SURG PROCTECTOMY W J-POUCH N/A 08/10/2020    Procedure: ROBOTIC ASSISTED LAPAROSCOPIC PROCTOCOLECTOMY, ILEAL J POUCH, WITH OSTOMY;  Surgeon: Mickle Asper, MD;  Location: OR [place];  Service: General Surgery    PR NDSC EVAL INTSTINAL POUCH DX W/COLLJ SPEC SPX N/A 01/23/2021    Procedure: ENDO EVAL SM INTEST POUCH; DX;  Surgeon: Modena Nunnery, MD;  Location: [facility] [place] [facility];  Service: Gastroenterology    PR NDSC EVAL INTSTINAL POUCH DX W/COLLJ SPEC SPX N/A 08/27/2021    Procedure: ENDO EVAL SM INTEST POUCH; DX;  Surgeon: Hunt Oris, MD;  Location: [facility]URES [facility];  Service: Gastroenterology    PR NDSC EVAL INTSTINAL POUCH DX W/COLLJ SPEC SPX N/A 12/09/2021    Procedure: ENDO EVAL SM INTEST POUCH; DX;  Surgeon: Vidal Schwalbe, MD;  Location: [facility] PROC[facility]URES [facility];  Service: Gastroenterology    PR NDSC EVAL INTSTINAL POUCH DX W/COLLJ [facility] SPX Left 04/09/2022    Procedure: ENDO EVAL SM INTEST POUCH; DX;  Surgeon: Modena Nunnery, MD;  Location: [facility] [place] [facility];  Service: Gastroenterology    PR NDSC EVAL INTSTINAL POUCH DX W/COLLJ SPEC SPX N/A 08/05/2022    Procedure: ENDO EVAL SM INTEST POUCH; DX;  Surgeon: Modena Nunnery, MD;  Location: [facility] PROC[facility]URES [facility];  Service: Gastroenterology    PR NDSC EVAL INTSTINAL POUCH DX W/COLLJ SPEC SPX N/A 03/13/2023    Procedure: ENDO EVAL SM INTEST POUCH; DX;  Surgeon: Carmon Ginsberg, MD;  Location: GI PROCEDURES [facility];  Service: Gastroenterology    PR NDSC EVAL INTSTINAL POUCH W/BX SINGLE/MULTIPLE N/A 01/20/2022    Procedure: ENDOSCOPIC EVAL OF SMALL INTESTINAL POUCH; DIAGNOSTIC, No biopsies;  Surgeon: Andrey Farmer, MD;  Location: GI PROCEDURES [facility];  Service: Gastroenterology    PR NDSC EVAL INTSTINAL POUCH W/BX SINGLE/MULTIPLE N/A 02/13/2022    Procedure: ENDOSCOPIC EVAL OF SMALL INTESTINAL POUCH; DIAGNOSTIC, WITH BIOPSY;  Surgeon: Bronson Curb, MD;  Location: GI PROCEDURES [facility];  Service: Gastroenterology    PR NDSC EVAL INTSTINAL POUCH W/BX SINGLE/MULTIPLE N/A 03/13/2023    Procedure: ENDOSCOPIC EVAL OF SMALL INTESTINAL POUCH; DIAGNOSTIC, WITH BIOPSY;  Surgeon: Carmon Ginsberg, MD;  Location: GI PROCEDURES [facility];  Service: Gastroenterology    PR UNLISTED PROCEDURE SMALL INTESTINE  01/23/2021    Procedure: UNLISTED PROCEDURE, SMALL INTESTINE;  Surgeon: Modena Nunnery, MD;  Location: [facility] [place] [facility];  Service: Gastroenterology    PR UNLISTED PROCEDURE SMALL INTESTINE  02/13/2022    Procedure: UNLISTED PROCEDURE, SMALL INTESTINE;  Surgeon: Bronson Curb, MD;  [facility]: [facility] PROC[facility]URES [facility];  Service: Gastroenterology    PR UPPER GI ENDOSCOPY,BIOPSY N/A 10/27/2012    Procedure: UGI ENDOSCOPY; WITH BIOPSY, SINGLE OR MULTIPLE;  Surgeon: Shirlyn Goltz Mir, MD;  Location: PEDS PROCEDURE ROOM [facility];  Service: Gastroenterology    PR UPPER GI ENDOSCOPY,BIOPSY N/A 09/14/2013    Procedure: UGI ENDOSCOPY; WITH BIOPSY, SINGLE OR MULTIPLE;  Surgeon: Shirlyn Goltz  Mir, MD;  Location: PEDS PROCEDURE ROOM [facility];  Service: Gastroenterology    PR UPPER GI ENDOSCOPY,BIOPSY N/A 11/08/2014    Procedure: UGI ENDOSCOPY; WITH BIOPSY, SINGLE OR MULTIPLE;  Surgeon: Arnold Long Mir, MD;  Location: PEDS PROCEDURE ROOM [facility];  Service: Gastroenterology    PR UPPER GI ENDOSCOPY,BIOPSY N/A 12/26/2015    Procedure: UGI ENDOSCOPY; WITH BIOPSY, SINGLE OR MULTIPLE;  Surgeon: Arnold Long Mir, MD;  Location: PEDS PROCEDURE ROOM [facility];  Service: Gastroenterology    PR UPPER GI ENDOSCOPY,BIOPSY N/A 08/27/2016    Procedure: UGI ENDOSCOPY; WITH BIOPSY, SINGLE OR MULTIPLE;  Surgeon: Arnold Long Mir, MD;  Location: PEDS PROCEDURE ROOM [facility];  Service: Gastroenterology    PR UPPER GI ENDOSCOPY,BIOPSY N/A 09/02/2017    Procedure: UGI ENDOSCOPY; WITH BIOPSY, SINGLE OR MULTIPLE;  Surgeon: Arnold Long Mir, MD;  Location: PEDS PROCEDURE ROOM [facility];  Service: Gastroenterology    PR UPPER GI ENDOSCOPY,BIOPSY N/A 03/13/2020    Procedure: UGI ENDOSCOPY; WITH BIOPSY, SINGLE OR MULTIPLE;  Surgeon: Helyn Numbers, MD;  Location: [facility] [place] [facility];  Service: Gastroenterology    PR UPPER GI ENDOSCOPY,BIOPSY N/A 09/05/2021    Procedure: UGI ENDOSCOPY; WITH BIOPSY, SINGLE OR MULTIPLE;  Surgeon: Wendall Papa, MD;  Location: GI PROCEDURES [facility];  Service: Gastroenterology    PR UPPER GI ENDOSCOPY,DIAGNOSIS N/A 01/20/2022    Procedure: UGI ENDO, INCLUDE ESOPHAGUS, STOMACH, & DUODENUM &/OR JEJUNUM; DX W/WO COLLECTION SPECIMN, BY BRUSH OR WASH;  Surgeon: Andrey Farmer, MD;  Location: GI PROCEDURES [facility];  Service: Gastroenterology    TUMOR REMOVAL      multiple-head, neck, back, hand, right flank, multiple       Allergies:  Allergies   Allergen Reactions    Adhesive Tape-Silicones Hives and Rash     Paper tape and Tegederm ok. Biopatch causes blistering but has tolerated CHG Tegaderm    Ferrlecit [Sodium Ferric Gluconat-Sucrose] Swelling and Rash    Levofloxacin Swelling and Rash     Swelling in mouth, rash,     Methylnaltrexone      Per Patient: I lost bowel control, severe abdominal cramping, and elevated BP    Neomycin Swelling     Rxn after ear drops; ear swelling    Papaya Hives    Morphine Nausea And Vomiting    Zosyn [Piperacillin-Tazobactam] Itching and Rash     Red and itchy    Compazine [Prochlorperazine] Other (See Comments)     Extreme agitation    Iron Analogues     Reglan [Metoclopramide Hcl] Diarrhea    Iron Dextran Itching     Received iron dextran 06/08/22 over 12 hours, had itching and redness/flushing during the infusion and for a couple days after. Required IV benadryl w/flares between doses and ultimately treated w/IV methylpred for 2 days.     Latex, Natural Rubber Rash      Objective     Physical Exam:  Vitals:    04/17/23 0615   BP: 110/50   Pulse: 78   Resp:    Temp:    SpO2: 97%      Pertinent Labs:  Recent Labs     04/15/23  0807 04/17/23  0759   WBC 6.2 6.8   HGB 11.6 11.3   HCT 34.9 34.0   PLT 212 214       Recent Labs     04/15/23  0807 04/17/23  0759   NA 142 143   K 4.0 3.9   CL 102 101  BUN 13 13   CREATININE 0.47* 0.43*   GLU 97 119   Estimated Creatinine Clearance: 177.6 mL/min (A) (based on SCr of 0.43 mg/dL (L)).     No results for input(s): INR, APTT, PTINR, FIBRINOGEN in the last 72 hours.    No results for input(s): PROT, ALBUMIN, AST, ALT, ALKPHOS, BILITOT, BILIDIR in the last 72 hours.    Lab Results   Component Value Date    LABBLOO No Growth at 5 days 03/27/2023    LABBLOO No Growth at 5 days 03/27/2023    LABBLOO No Growth at 5 days 03/25/2023    LABBLOO Streptococcus mitis group (A) 03/25/2023     Pertinent Imaging:  PVL 1/27.  Right: No evidence of obstruction was seen in the central veins.  Left: No evidence of obstruction was seen in the central veins. Evidence of acute obstruction is visualized in the brachial vein at around a line. The basilic vein was not visualized due to PICC. All other venous findings in the upper extremity are within normal limits.

## 2023-04-17 NOTE — Unmapped (Addendum)
Recreational Therapy Evaluation  04/17/2023    Patient Name:  [name] [name]       Medical Record Number: [id number]   Date of Birth: [birth year]  Sex: Female          Room/Bed:  2N562/1H086-57    Eval Duration: 20 Min.    Assessment  Pt is a 23 y.o. female with PMH of [name] s/p proctocolectomy and J-pouch, desmoid tumours, chronic abdominal pain and nausea, anxiety, PTSD related to past medical care, DVT RUE s/p Eliquis x3 months admitted for constellation of worsening pain, intolerance of oral intake. She is now admitted to [facility] for comprehensive interdisciplinary rehabilitation. LRT provided pt w/c escort RT Gym to facilitate opportunity for pt to engage in familiar leisure activity of 'watercolor painting' throughout initial Rec Therapy eval. Pt eager to engage in expressive art activity and appeared to be in bright spirits. LRT provided re-education re: RT treatment services in [facility] as pt identified her motivators for therapy and described her healthcare journey since last [facility] admission. Pt expressed stressors related to ongoing weakness and nausea, however motivated to continue working toward a positive recovery. Additionally, pt shared pictures of progress she's making with her therapy dog training and remains enthusiastic about nursing school. Pt's mother present and engaged throughout. Pt would benefit from additional interventions to promote recovery and directives related to community re-entry, activity tolerance, and relaxation training. Pt is agreeable to RT POC with goals established below; see full assessment for details.    Plan of Care  1x per day for: 1-2x week     Motivators: Able to Identify motivators  Patient's Motivators: Gaining independence, nursing school, therapy dog, family, friends  Patient's Identified Treatment Goal: Pt did not identify a tx goal at this time  Patient's Stressors / Triggers: nausea, UE weakness, prolonged hospitalization  Treatment Plan developed in collaboration with: Patient, Family, Treatment Team  Interventions: Adjustment to hospitalization, Health Care Encounters and Medical Condition, Community reintegration, Coping skills, Discharge planning, Education - Family / caregiver, Education - Patient, Expressive arts, Public house manager, Leisure, Promoting social interaction, Psychosocial counseling, Management consultant, Stress management, Wellness / recovery     Goals:  Within 2 tx sessions, pt will engage in at least 1 leisure/wellness pursuits >20 minutes to promote increased activity tolerance and healthy coping.  Prior to d/c, after education, pt/family will identify at least 2 community resources to utilize for ongoing support post d/c.    Interventions: Adjustment to hospitalization, Health Care Encounters and Medical Condition, Community reintegration, Coping skills, Discharge planning, Education - Family / caregiver, Education - Patient, Expressive arts, Public house manager, Leisure, Promoting social interaction, Psychosocial counseling, Management consultant, Stress management, Wellness / recovery       Subjective    Current Situation: Pt received sitting in her w/c in room, mother present.  Cognitive, Emotional, Physical, Social, and Leisure/Life functioning were assessed:: Patient Interviews, Discussion with family, Review of Chart, Treatment Team, Observation in Activities/Interventions  Employment: Unemployed  Living Environment: House  Lives With: Mother, Father, Sibling(s)  Precautions: Falls Insurance underwriter / Environment: Patient not wearing mask for full session, Caregiver wearing mask for full session, Wheelchair  Reports/displays signs/symptoms of pain?: No  Add'l Session Information: Geophysicist/field seismologist present, RN aware of RT tx session (Pt's mother present.)    Past Medical History:   Diagnosis Date    Abdominal pain     Acid reflux     occas    Anesthesia complication  itching, shaking, coldness; last few surgeries have gone much better    Cancer (CMS-HCC)     Cataract of right eye     COVID-19 virus infection 01/2019    Cyst of thyroid determined by ultrasound     monitoring    [name]     2 right forearm, 1 left thigh, 1 right scapula, 1 under left clavicle; multiple    Difficult intravenous access     [name] (familial adenomatous polyposis)     [name]     Gastric polyps     History of chemotherapy     last treatment approx 05/2019    History of colon polyps     History of COVID-19 01/2019    Ileus (CMS-HCC) 03/16/2022    Iron deficiency anemia due to chronic blood loss     received iron infusion 11-2019    PONV (postoperative nausea and vomiting)     Rectal bleeding     Syncopal episodes     especially if becoming dehydrated    Social History     Tobacco Use    Smoking status: Never     Passive exposure: Past    Smokeless tobacco: Never   Substance Use Topics    Alcohol use: Never      Past Surgical History:   Procedure Laterality Date    COLON SURGERY      cyroablation      cystis removal      desmoid removal      PR CLOSE ENTEROSTOMY,RESEC+ANAST N/A 10/09/2020    Procedure: ILEOSTOMY TAKEDOWN;  Surgeon: Mickle Asper, MD;  Location: OR [place];  Service: General Surgery    PR COLONOSCOPY W/BIOPSY SINGLE/MULTIPLE N/A 10/27/2012    Procedure: COLONOSCOPY, FLEXIBLE, PROXIMAL TO SPLENIC FLEXURE; WITH BIOPSY, SINGLE OR MULTIPLE;  Surgeon: Shirlyn Goltz Mir, MD;  Location: PEDS PROCEDURE ROOM [employer];  Service: Gastroenterology    PR COLONOSCOPY W/BIOPSY SINGLE/MULTIPLE N/A 09/14/2013    Procedure: COLONOSCOPY, FLEXIBLE, PROXIMAL TO SPLENIC FLEXURE; WITH BIOPSY, SINGLE OR MULTIPLE;  Surgeon: Shirlyn Goltz Mir, MD;  Location: PEDS PROCEDURE ROOM [place];  Service: Gastroenterology    PR COLONOSCOPY W/BIOPSY SINGLE/MULTIPLE N/A 11/08/2014    Procedure: COLONOSCOPY, FLEXIBLE, PROXIMAL TO SPLENIC FLEXURE; WITH BIOPSY, SINGLE OR MULTIPLE;  Surgeon: Arnold Long Mir, MD;  Location: PEDS PROCEDURE ROOM [facility];  Service: Gastroenterology    PR COLONOSCOPY W/BIOPSY SINGLE/MULTIPLE N/A 12/26/2015    Procedure: COLONOSCOPY, FLEXIBLE, PROXIMAL TO SPLENIC FLEXURE; WITH BIOPSY, SINGLE OR MULTIPLE;  Surgeon: Arnold Long Mir, MD;  Location: PEDS PROCEDURE ROOM [facility];  Service: Gastroenterology    PR COLONOSCOPY W/BIOPSY SINGLE/MULTIPLE N/A 09/02/2017    Procedure: COLONOSCOPY, FLEXIBLE, PROXIMAL TO SPLENIC FLEXURE; WITH BIOPSY, SINGLE OR MULTIPLE;  Surgeon: Arnold Long Mir, MD;  Location: PEDS PROCEDURE ROOM [place];  Service: Gastroenterology    PR COLSC FLX W/REMOVAL LESION BY HOT BX FORCEPS N/A 08/27/2016    Procedure: COLONOSCOPY, FLEXIBLE, PROXIMAL TO SPLENIC FLEXURE; W/REMOVAL TUMOR/POLYP/OTHER LESION, HOT BX FORCEP/CAUTE;  Surgeon: Arnold Long Mir, MD;  Location: PEDS PROCEDURE ROOM [facility];  Service: Gastroenterology    PR COLSC FLX W/RMVL OF TUMOR POLYP LESION SNARE TQ N/A 02/25/2019    Procedure: COLONOSCOPY FLEX; W/REMOV TUMOR/LES BY SNARE;  Surgeon: Helyn Numbers, MD;  Location: GI PROCEDURES [facility];  Service: Gastroenterology    PR COLSC FLX W/RMVL OF TUMOR POLYP LESION SNARE TQ N/A 03/13/2020    Procedure: COLONOSCOPY FLEX; W/REMOV TUMOR/LES BY SNARE;  Surgeon: Helyn Numbers, MD;  Location: [facility]  [facility]EDURES [facility];  Service: Gastroenterology    PR EXC SKIN BENIG 2.1-3 CM TRUNK,ARM,LEG Right 02/25/2017    Procedure: EXCISION, BENIGN LESION INCLUDE MARGINS, EXCEPT SKIN TAG, LEGS; EXCISED DIAMETER 2.1 TO 3.0 CM;  Surgeon: Clarene Duke, MD;  Location: CHILDRENS OR [facility];  Service: Plastics    PR EXC SKIN BENIG 3.1-4 CM TRUNK,ARM,LEG Right 02/25/2017    Procedure: EXCISION, BENIGN LESION INCLUDE MARGINS, EXCEPT SKIN TAG, ARMS; EXCISED DIAMETER 3.1 TO 4.0 CM;  Surgeon: Clarene Duke, MD;  Location: CHILDRENS OR [facility];  Service: Plastics    PR EXC SKIN BENIG >4 CM FACE,FACIAL Right 02/25/2017    Procedure: EXCISION, OTHER BENIGN LES INCLUD MARGIN, FACE/EARS/EYELIDS/NOSE/LIPS/MUCOUS MEMBRANE; EXCISED DIAM >4.0 CM;  Surgeon: Clarene Duke, MD;  Location: CHILDRENS OR [facility];  Service: Plastics    PR EXC TUMOR SOFT TISSUE LEG/ANKLE SUBQ 3+CM Right 08/05/2019    Procedure: EXCISION, TUMOR, SOFT TISSUE OF LEG OR ANKLE AREA, SUBCUTANEOUS; 3 CM OR GREATER;  Surgeon: Arsenio Katz, MD;  Location: MAIN OR [place];  Service: Plastics    PR EXC TUMOR SOFT TISSUE LEG/ANKLE SUBQ <3CM Right 08/05/2019    Procedure: EXCISION, TUMOR, SOFT TISSUE OF LEG OR ANKLE AREA, SUBCUTANEOUS; LESS THAN 3 CM;  Surgeon: Arsenio Katz, MD;  Location: MAIN OR [facility];  Service: Plastics    PR LAP, SURG PROCTECTOMY W J-POUCH N/A 08/10/2020    Procedure: ROBOTIC ASSISTED LAPAROSCOPIC PROCTOCOLECTOMY, ILEAL J POUCH, WITH OSTOMY;  Surgeon: Mickle Asper, MD;  Location: OR [place];  Service: General Surgery    PR NDSC EVAL INTSTINAL POUCH DX W/COLLJ SPEC SPX N/A 01/23/2021    Procedure: ENDO EVAL SM INTEST POUCH; DX;  Surgeon: Modena Nunnery, MD;  Location: [facility]ED[facility];  Service: Gastroenterology    PR NDSC EVAL INTSTINAL POUCH DX W/COLLJ SPEC SPX N/A 08/27/2021    Procedure: ENDO EVAL SM INTEST POUCH; DX;  Surgeon: Hunt Oris, MD;  [facility]: [facility]URES [facility];  Service: Gastroenterology    PR NDSC EVAL INTSTINAL POUCH DX W/COLLJ SPEC SPX N/A 12/09/2021    Procedure: ENDO EVAL SM INTEST POUCH; DX;  Surgeon: Vidal Schwalbe, MD;  Location: GI PROCEDURES [facility];  Service: Gastroenterology    PR NDSC EVAL INTSTINAL POUCH DX W/COLLJ [facility] SPX Left 04/09/2022    Procedure: ENDO EVAL SM INTEST POUCH; DX;  Surgeon: Modena Nunnery, MD;  [facility]: [facility] [place] [facility];  Service: Gastroenterology    PR NDSC EVAL INTSTINAL POUCH DX W/COLLJ SPEC SPX N/A 08/05/2022    Procedure: ENDO EVAL SM INTEST POUCH; DX;  Surgeon: Modena Nunnery, MD;  Location: GI PROCEDURES [facility];  Service: Gastroenterology    PR NDSC EVAL INTSTINAL POUCH DX W/COLLJ SPEC SPX N/A 03/13/2023    Procedure: ENDO EVAL SM INTEST POUCH; DX;  Surgeon: Carmon Ginsberg, MD;  Location: GI PROCEDURES [facility];  Service: Gastroenterology    PR NDSC EVAL INTSTINAL POUCH W/BX SINGLE/MULTIPLE N/A 01/20/2022    Procedure: ENDOSCOPIC EVAL OF SMALL INTESTINAL POUCH; DIAGNOSTIC, No biopsies;  Surgeon: Andrey Farmer, MD;  Location: GI PROCEDURES [facility];  Service: Gastroenterology    PR NDSC EVAL INTSTINAL POUCH W/BX SINGLE/MULTIPLE N/A 02/13/2022    Procedure: ENDOSCOPIC EVAL OF SMALL INTESTINAL POUCH; DIAGNOSTIC, WITH BIOPSY;  Surgeon: Bronson Curb, MD;  Location: GI PROCEDURES [facility];  Service: Gastroenterology    PR NDSC EVAL INTSTINAL POUCH W/BX SINGLE/MULTIPLE N/A 03/13/2023    Procedure: ENDOSCOPIC EVAL OF SMALL  INTESTINAL POUCH; DIAGNOSTIC, WITH BIOPSY;  Surgeon: Fara Boros Michelene Heady, MD;  Location: GI PROCEDURES [facility];  Service: Gastroenterology    PR UNLISTED PROCEDURE SMALL INTESTINE  01/23/2021    Procedure: UNLISTED PROCEDURE, SMALL INTESTINE;  Surgeon: Modena Nunnery, MD;  [facility]: [facility];  Service: Gastroenterology    PR UNLISTED PROCEDURE SMALL INTESTINE  02/13/2022    Procedure: UNLISTED PROCEDURE, SMALL INTESTINE;  Surgeon: Bronson Curb, MD;  Location: GI PROCEDURES [facility];  Service: Gastroenterology    PR UPPER GI ENDOSCOPY,BIOPSY N/A 10/27/2012    Procedure: UGI ENDOSCOPY; WITH BIOPSY, SINGLE OR MULTIPLE;  Surgeon: Shirlyn Goltz Mir, MD;  Location: PEDS PROCEDURE ROOM [facility];  Service: Gastroenterology    PR UPPER GI ENDOSCOPY,BIOPSY N/A 09/14/2013    Procedure: UGI ENDOSCOPY; WITH BIOPSY, SINGLE OR MULTIPLE;  Surgeon: Shirlyn Goltz Mir, MD;  Location: PEDS PROCEDURE ROOM [facility];  Service: Gastroenterology    PR UPPER GI ENDOSCOPY,BIOPSY N/A 11/08/2014    Procedure: UGI ENDOSCOPY; WITH BIOPSY, SINGLE OR MULTIPLE;  Surgeon: Arnold Long Mir, MD;  Location: PEDS PROCEDURE ROOM [facility];  Service: Gastroenterology    PR UPPER GI ENDOSCOPY,BIOPSY N/A 12/26/2015    Procedure: UGI ENDOSCOPY; WITH BIOPSY, SINGLE OR MULTIPLE;  Surgeon: Arnold Long Mir, MD;  Location: PEDS PROCEDURE ROOM [facility];  Service: Gastroenterology    PR UPPER GI ENDOSCOPY,BIOPSY N/A 08/27/2016    Procedure: UGI ENDOSCOPY; WITH BIOPSY, SINGLE OR MULTIPLE;  Surgeon: Arnold Long Mir, MD;  Location: PEDS PROCEDURE ROOM [facility];  Service: Gastroenterology    PR UPPER GI ENDOSCOPY,BIOPSY N/A 09/02/2017    Procedure: UGI ENDOSCOPY; WITH BIOPSY, SINGLE OR MULTIPLE;  Surgeon: Arnold Long Mir, MD;  Location: PEDS PROCEDURE ROOM [facility];  Service: Gastroenterology    PR UPPER GI ENDOSCOPY,BIOPSY N/A 03/13/2020    Procedure: UGI ENDOSCOPY; WITH BIOPSY, SINGLE OR MULTIPLE;  Surgeon: Helyn Numbers, MD;  [facility]: [facility] [facility];  Service: Gastroenterology    PR UPPER GI ENDOSCOPY,BIOPSY N/A 09/05/2021    Procedure: UGI ENDOSCOPY; WITH BIOPSY, SINGLE OR MULTIPLE;  Surgeon: Wendall Papa, MD;  Location: GI PROCEDURES [facility];  Service: Gastroenterology    PR UPPER GI ENDOSCOPY,DIAGNOSIS N/A 01/20/2022    Procedure: UGI ENDO, INCLUDE ESOPHAGUS, STOMACH, & DUODENUM &/OR JEJUNUM; DX W/WO COLLECTION SPECIMN, BY BRUSH OR WASH;  Surgeon: Andrey Farmer, MD;  Location: GI PROCEDURES [facility] [place];  Service: Gastroenterology    TUMOR REMOVAL      multiple-head, neck, back, hand, right flank, multiple    Family History   Problem Relation Age of Onset    No Known Problems Mother     No Known Problems Father     No Known Problems Sister     No Known Problems Brother     Stroke Maternal Grandmother     Other Maternal Grandmother         benign lesions of liver and pancreas, further details unknown    Cancer Maternal Grandmother     Diabetes Maternal Grandmother     Hypertension Maternal Grandmother     Thyroid disease Maternal Grandmother     Arthritis Maternal Grandfather     Asthma Maternal Grandfather     COPD Paternal Grandmother         Deceased    Miscarriages / Stillbirths Paternal Grandmother     Alcohol abuse Paternal Grandfather         Deceased    No Known Problems Maternal Aunt     No Known Problems Maternal  Uncle     No Known Problems Paternal Aunt     No Known Problems Paternal Uncle     Anesthesia problems Neg Hx     Broken bones Neg Hx     Cancer Neg Hx     Clotting disorder Neg Hx     Collagen disease Neg Hx     Diabetes Neg Hx     Dislocations Neg Hx     Fibromyalgia Neg Hx     Gout Neg Hx     Hemophilia Neg Hx     Osteoporosis Neg Hx     Rheumatologic disease Neg Hx     Scoliosis Neg Hx     Severe sprains Neg Hx     Sickle cell anemia Neg Hx     Spinal Compression Fracture Neg Hx     Melanoma Neg Hx     Basal cell carcinoma Neg Hx     Squamous cell carcinoma Neg Hx         Allergies: Adhesive tape-silicones; Ferrlecit [sodium ferric gluconat-sucrose]; Levofloxacin; Methylnaltrexone; Neomycin; Papaya; Morphine; Zosyn [piperacillin-tazobactam]; Compazine [prochlorperazine]; Iron analogues; Reglan [metoclopramide hcl]; Iron dextran; and Latex, natural rubber     Objective    Cognitive  Stage of change / level of insight: Contemplation  Thought Process/Content: Intact  Attention Span/Alertness : Able to attend to RT assessment  Medical understanding: Would benefit from additional dx/tx education', Knows name of diagnosis and/or medical equipment, Able to explain treatments/procedures related to medical condition, Understands long term implications of diagnosis and/or treatment  Orientation: Fully oriented  Additional Cognitive Domain Comment: Pt with no cognitive deficits.    Communication  Communication Barriers: None noted    Emotional  Mood: Pleasant, Hopeful  Affect: Congruent  Anxiety Management : Reports anxiety that is situational  Pain Management : Reports no pain  Coping Skills : Requires resources/assistance to utilize coping strategies  Motivated to learn new coping strategies?: Yes  Adjustment: Willing to learn ways to compensate  Emotional Expression: Independently expresses feelings  Additional Emotional Domain comments: Pt endorsed coping well at this time, however experiences anxiety due to hospitalization and challenges that have presented.    Physical Domain  Mobility Comments: Per PT, sit<>stand x 5 from wheelchair in // bars with focus on standing without arm support and without a device with CGA for facilitation of anterior weight shift with increased time to complete full stand and to transition arms off armrests.  Upper extremity function: Pt observed utilizing RUE to stroke adaptive paintbrush while Copy.  Lower extremity function: Please refer to full PT eval.  Equipment/Device needs: wheelchair    Social  Support system: Reports positive support system  Patient Behaviors and Interactions: Appropriate  Ability to form relationship / interact with others: Able to form social relationships independently  Additional Social Domain Comments: Pt lives in [place]) with her mom, dad, and sibling. Pt also has a therapy dog.     Leisure and Life Function  Level of involvement: No current participation  Firefighter / barrier education: Unable to return to community at this time  Actor for Leisure and life functioning: Independently investigates adaptations, but requires resources for participation.  Motivation to engage in leisure / play: Yes  Quality of participation: Involved in healthy leisure and verbalizes plans to overcome barriers  Additional Leisure and Life Function Comments: Pt enjoys leisure activities of shopping, traveling, going to [employer] , art, and decorating. Pt also currently taking nursing school courses.  I attest that I have reviewed the above information.  Signed by Alfonso Ramus, LRT/CTRS   Filed 04/17/2023   The care for this patient was completed by Alfonso Ramus, LRT/CTRS :  A student was present and participated in the care. Licensed/Credentialed therapist was physically present and immediately available to direct and supervise tasks that were related to patient management. The direction and supervision was continuous throughout the time these tasks were performed.    Alfonso Ramus, LRT/CTRS

## 2023-04-17 NOTE — Unmapped (Signed)
No acute events overnight. Patient is alert and oriented. Patient is tolerating cyclic tube feed (4pm-8am) with Nutren 2.0 at the rate of 45ml/hr and 75ml flush every three hours.Rt forearm PIV intact. Most meds given via L nare NG tube and a few via IV. Pain continuous to be managed with scheduled pain medications and PRN Dilaudid and oxycodone. Nausea managed with scheduled Phenergan . No skin changes noted. Safety precautions maintained. Mother at bedside and involved in patient care. Resting quietly in bed without new concerns at this time. Will continue to monitor and give report to next shift .      Problem: Self-Care Deficit  Problem: Fall Injury Risk  Goal: Absence of Fall and Fall-Related Injury  Intervention: Identify and Manage Contributors  Recent Flowsheet Documentation  Taken 04/16/2023 2000 by Rudell Cobb, RN  Self-Care Promotion: independence encouraged  Intervention: Promote Injury-Free Environment  Recent Flowsheet Documentation  Taken 04/17/2023 0200 by Rudell Cobb, RN  Safety Interventions: fall reduction program maintained  Taken 04/17/2023 0000 by Rudell Cobb, RN  Safety Interventions: fall reduction program maintained  Taken 04/16/2023 2200 by Rudell Cobb, RN  Safety Interventions: fall reduction program maintained  Taken 04/16/2023 2000 by Rudell Cobb, RN  Safety Interventions: fall reduction program maintained     Recent Flowsheet Documentation  Taken 04/16/2023 2000 by Rudell Cobb, RN  Self-Care Promotion: independence encouraged     Problem: Rehabilitation (IRF) Plan of Care  Goal: Absence of New-Onset Illness or Injury  Intervention: Prevent Fall and Fall Injury  Recent Flowsheet Documentation  Taken 04/17/2023 0200 by Rudell Cobb, RN  Safety Interventions: fall reduction program maintained  Taken 04/17/2023 0000 by Rudell Cobb, RN  Safety Interventions: fall reduction program maintained  Taken 04/16/2023 2200 by Rudell Cobb, RN  Safety Interventions: fall reduction program maintained  Taken 04/16/2023 2000 by Rudell Cobb, RN  Safety Interventions: fall reduction program maintained  Intervention: Prevent Skin Injury  Recent Flowsheet Documentation  Taken 04/17/2023 0200 by Rudell Cobb, RN  Skin Protection: incontinence pads utilized  Taken 04/17/2023 0000 by Rudell Cobb, RN  Device Skin Pressure Protection: absorbent pad utilized/changed  Skin Protection: incontinence pads utilized  Taken 04/16/2023 2200 by Rudell Cobb, RN  Device Skin Pressure Protection: absorbent pad utilized/changed  Taken 04/16/2023 2000 by Rudell Cobb, RN  Device Skin Pressure Protection: absorbent pad utilized/changed  Skin Protection: incontinence pads utilized  Intervention: Prevent Infection  Recent Flowsheet Documentation  Taken 04/17/2023 0200 by Rudell Cobb, RN  Infection Prevention:   hand hygiene promoted   rest/sleep promoted  Taken 04/17/2023 0000 by Rudell Cobb, RN  Infection Prevention:   hand hygiene promoted   rest/sleep promoted  Taken 04/16/2023 2000 by Rudell Cobb, RN  Infection Prevention:   hand hygiene promoted   rest/sleep promoted  Intervention: Prevent VTE (Venous Thromboembolism)  Recent Flowsheet Documentation  Taken 04/16/2023 2000 by Rudell Cobb, RN  VTE Prevention/Management: anticoagulant therapy     Problem: Fall Injury Risk  Goal: Absence of Fall and Fall-Related Injury  Intervention: Identify and Manage Contributors  Recent Flowsheet Documentation  Taken 04/16/2023 2000 by Rudell Cobb, RN  Self-Care Promotion: independence encouraged  Intervention: Promote Injury-Free Environment  Recent Flowsheet Documentation  Taken 04/17/2023 0200 by Rudell Cobb, RN  Safety Interventions: fall reduction program maintained  Taken 04/17/2023 0000 by Rudell Cobb, RN  Safety Interventions: fall reduction program maintained  Taken 04/16/2023 2200 by Rudell Cobb, RN  Safety Interventions: fall reduction program maintained  Taken 04/16/2023 2000 by Rudell Cobb, RN  Safety Interventions: fall reduction program maintained

## 2023-04-17 NOTE — Unmapped (Signed)
Physical Medicine and Rehabilitation  Daily Progress Note [facility]    ASSESSMENT:     [name] [name] is a 23 y.o. female 23 y.o. female with PMH of [name] s/p proctocolectomy and J-pouch, desmoid tumours, chronic abdominal pain and nausea, anxiety, PTSD related to past medical care, DVT RUE s/p Eliquis x3 months admitted for constellation of worsening pain, intolerance of oral intake. She is now admitted to [facility] for comprehensive interdisciplinary rehabilitation.      Rehab Impairment Group Code [facility]):   (Debility) 16 Debility (Non-Cardiac/Non-Pulmonary)   Etiology: worsening pain, intolerance of oral intake     PLAN:     This patient is admitted to the Physical Medicine and Rehabilitation - Inpatient - A service from 8am-5pm on weekdays for questions regarding this patient. After hours, weekends, and holidays please contact the 1st Call resident pager     REHAB:   - PT and OT to maximize functional status with mobility and ADLs as well as prevention of joint contracture.   - P&O for assistive devices PRN.  - To be discussed in weekly Interdisciplinary Team Conference.     Acute on chronic abdominal pain and nausea  Complex situation discussed at length in prior notes. In brief, her pain and nausea are (deeply) multifactorial but consensus among treatment team is that new / unknown organic issue is unlikely. Plan to focus on symptom control, adequate nutrition.  Patient is currently at goal tube feeding, tolerating well.  No visible emesis noted.  Still reports persistent nausea but on aggressive nausea management as below. Chronic pain service consulted, appreciated.  - NO CHANGES to pain regimen without multiparty discussion including patient, unless true emergency.  Seen by chronic pain 1/28, recommended to continue the pain regimen as below no changes made.  - Pain management:  -- oxycodone (liquid) 20 mg q4 prn-first-line for pain  -- changed hydromorphone 1 mg IV q6prn -second line for pain  (ONLY IF inadequate relief 1 hour after oral alternative)  -- buprenorphine 20 mcg/h patch   -- hyoscyamine prn  -- Baclofen 5mg  TID  - Nausea: discuss about transitioning one of the IV nausea medication to p.o.  -- ondansetron IV q8 PRN  -- promethazine IV q6   -- scopolamine patch  -- olanzapine 5 mg in evening  -- no diphenhydramine IV in absence of clear indication  - Dyspepsia:  -- famotidine bid  -- PPI IV q12  - GI motility:  -- naloxegol prn  -- okay to use loperamide sparingly     Left arm PICC line site swelling, pain and ecchymosis - DVT  Left arm PICC line site noted mild swelling and mild ecchymosis from insertion.  No visible swelling noted below the elbow.  PVL confirmed acute DVT, started on anticoagulation and removed PICC line on 1/28, given acute DVT also patient is currently not on any IV antibiotic. Removed PICC line 1/28,  swelling around prior picc line site improving, mild redness/ rash  due to tape, still has bruises but resolving.   - Eliquis 5mg  BID; will need 3 months of therapy  - Stopped lovenox     Inadequate oral intake  Abnormal gut anatomy notwithstanding, it is the consensus among hospitalist and GI services that oral and/or enteral feeding should be sufficient to meet caloric and hydration needs. Has not developed refeeding syndrome. TPN will not be offered in forseeable future.  - titrate tube feeds to ~100% USRDI = 65 mL/h: reached at 60cc/hr- tolerated well.   -  Per dietitian, patient requested for cyclic feeding , started on cyclic feeding with Nutren 2.0, at goal rate 50 cc/h for 16 hours with acute 4-hour water flushes 150 cc .   - Calorie count per nutrition  - follow BMP, Mg phos q48-72, stop once at goal feeds  - GI to follow and manage wean from enteral nutrition at discharge     Elevated transaminases  Improving: Labs with AST 21, ALT 70, downtrending from prior. No evidence of cholestasis or synthetic dysfunction. Presume drug-related. No longer following closely as trend is well established.     Pouchitis (dx 03/2023), resolving  No plan for repeat endoscopy or colonoscopy during this admission, per GI, given recent exam. No significant decrement in Hb so far  - Cefdinir 300 mg daily (end = 05/05/2023)  - Completed Cefdinir 300 mg bid for 4 weeks (end = 04/07/2023)  - CBC weekly     Desmoid tumors  - okay to hold nirogesat while having difficulty with oral meds, no urgency to resume per Dr [name]     Mood / anxiety  - continue escitalopram  - lorazepam 1 mg bid prn (home medication)     Daily Checklist  - Diet: Regular diet with cyclic tube feeds  - DVT PPX: eliquis  - GI PPX: IV protonix BID  - Access: piv    DME needs  - To be determined    DISPO: Patient to be discussed at weekly interdisciplinary team conference.   - EDD: 2/13  - Follow-up: PCP, PM&R, GI, Oncology    SUBJECTIVE:     Interval Events: NAEO.  She and mom have concerns regarding port placement.  Had another PIV blowout and the rounding nurse had to place it.  The rounding nurse recommended trying to get port placement with [facility] while she is inpatient. We discussed with Dr. [name] and he would like [name] to try this while she was inpatient at this time. She also reported some bleeding on her toilet paper-she and her mother feel it is not a lot and that probably not something urgent. They did want me to reach out to her hematologist-Cassi Homero Fellers. Homero Fellers wanted to switch from her therapeutic Lovenox to Eliquis 5 mg twice daily for acute DVT.  She will follow-up with Wynona Canes in the outpatient setting.    OBJECTIVE:     Vital signs (last 24 hours):  Temp:  [36.5 ??C (97.7 ??F)] 36.5 ??C (97.7 ??F)  Heart Rate:  [78-87] 78  BP: (71-110)/(50-59) 110/50  MAP (mmHg):  [64-70] 70  SpO2:  [97 %-100 %] 97 %    Intake/Output (last 3 shifts):  I/O last 3 completed shifts:  In: 1345 [P.O.:240; NG/GT:1105]  Out: -     Physical Exam:   GEN: NAD, sitting in manual wheelchair  EYES: sclera anicteric, conjunctiva clear   HEENT: MMM, NG tube in place  NECK: trachea midline  CV: warm extremities, well perfused, no cyanosis   RESP:  NWOB on RA  ABD: Not distended  SKIN: no visible masses, lesions, rashes, ecchymoses.   MSK: no notable contractures or ext swelling  NEURO:Alert, MAE, speech fluid and coherent, responds appropriately to questions   PSYCH: mood euthymic, affect appropriate, thought process logical         Medications:  Scheduled    acetaminophen (TYLENOL) oral liquid Q8H SCH    ammonium lactate (LAC-HYDRIN) 12 % lotion 1 Application BID    baclofen (LIORESAL) tablet 5 mg TID  buprenorphine 20 mcg/hour transdermal patch 1 patch Weekly    cefdinir (OMNICEF) oral suspension Daily    cetirizine (ZYRTEC) oral syrup BID    escitalopram oxalate (LEXAPRO) tablet 5 mg Daily    famotidine (PEPCID) oral suspension Nightly    fluticasone propionate (FLONASE) 50 mcg/actuation nasal spray 1 spray Weekly    guar gum (NUTRISOURCE) 1 packet TID    lidocaine (ASPERCREME) 4 % 1 patch Daily    multivitamin with folic acid 400 mcg tablet 1 tablet Daily    OLANZapine zydis (ZYPREXA) disintegrating tablet 5 mg Q PM    pantoprazole (Protonix) injection 40 mg BID    promethazine (PHENERGAN) 12.5 mg in sodium chloride 0.9 % 50 mL IVPB (premix) Q6H    scopolamine (TRANSDERM-SCOP) 1 mg over 3 days topical patch 1 mg Q72H    sodium chloride (NS) 0.9 % flush 10 mL Q8H     PRN hydrocortisone, , BID PRN  hydrocortisone, , BID PRN  HYDROmorphone, 1 mg, Q6H PRN  hyoscyamine, 125 mcg, Q4H PRN  loperamide, 2 mg, QID PRN  LORazepam, 1 mg, BID PRN  melatonin, 3 mg, Nightly PRN  naloxegol, 12.5 mg, Daily PRN  ondansetron, 8 mg, Q8H PRN  oxyCODONE, 20 mg, Q4H PRN  zinc oxide-cod liver oil, , Daily PRN          Labs/Studies: Reviewed     Radiology Results:   No results found.        Quality Indicators      Hearing, Speech, and Vision  Ability to Hear: Adequate  Ability to See in Adequate Light: Adequate  Expression of Ideas and Wants: Without difficulty  Understanding Verbal and Non-Verbal Content: Understands    Cognitive Pattern Assessment  Cognitive Pattern Assessment Used: BIMS  Brief Interview for Mental Status (BIMS)  Repetition of Three Words (First Attempt): 3  Temporal Orientation: Year: Correct  Temporal Orientation: Month: Accurate within 5 days  Temporal Orientation: Day: Correct  Recall: Sock: Yes, no cue required  Recall: Blue: Yes, no cue required  Recall: Bed: Yes, no cue required  BIMS Summary Score: 15       Nutritional Approaches  Nutritional Approach: Feeding tube    ADLs  Admission Current   Eating Assistance Needed: Set-up / clean-up    CARE Score - 5 Assistance Needed: Set-up / clean-up    CARE Score - 5   Oral Hygiene  Assistance Needed: Physical assistance Total assistance  CARE Score - 1  Assistance Needed: Physical assistance    CARE Score - 1   Bladder Continence       Bowel Continence       Toileting Hygiene Assistance Needed: Incidental touching    CARE Score - 4 Assistance Needed: Incidental touching    CARE Score - 4    Toilet Transfer Assistance Needed: Physical assistance 25% or less  CARE Score - 3 Assistance Needed: Physical assistance    CARE Score - 3   Shower/Bathe Self      CARE Score - 88       CARE Score - 88   Upper Body Dressing Assistance Needed: Physical assistance 26%-50%  CARE Score - 3  Assistance Needed: Independent    CARE Score - 6   Lower Body Dressing Assistance Needed: Physical assistance 76% or more  CARE Score - 2  Assistance Needed: Physical assistance    CARE Score - 2   On/Off Footwear Assistance Needed: Physical assistance Total assistance  CARE Score - 1  Assistance Needed: Physical assistance    CARE Score - 1         Transfers Admission Current   Bed to Chair Assistance Needed: Incidental touching    CARE Score - 4  Assistance Needed: Incidental touching    CARE Score - 4     Lying to Sitting Assistance Needed: Physical assistance 26%-50%  CARE Score - 3  Assistance Needed: Physical assistance    CARE Score - 3 Roll Left/Right Assistance Needed: Physical assistance 25% or less  CARE Score - 3  Assistance Needed: Physical assistance    CARE Score - 3   Sit to Lying Assistance Needed: Physical assistance 26%-50%  CARE Score - 3  Assistance Needed: Physical assistance    CARE Score - 3   Sit to Stand Assistance Needed: Incidental touching    CARE Score - 4  Assistance Needed: Incidental touching    CARE Score - 4         Mobility Admission Current   Walk 10 Feet Assistance Needed: Adaptive equipment, Physical assistance 25% or less  CARE Score - 3 Assistance Needed: Adaptive equipment, Physical assistance    CARE Score - 3     Walk 50 Feet 2 Turns Assistance Needed: Physical assistance 51%-75%  CARE Score - 2 Assistance Needed: Physical assistance    CARE Score - 2   Walk 150 Feet      CARE Score - 88       CARE Score - 88   Walk 10 Feet Uneven      CARE Score - 88       CARE Score - 88   1 Step (Curb)      CARE Score - 88       CARE Score - 88   4 Steps      CARE Score - 88       CARE Score - 88   12 Steps      CARE Score - 88       CARE Score - 88   Picking Up Object      CARE Score - 10       CARE Score - 10   Wheelchair/Scooter Use        Wheel 50 Feet 2 Turns Assistance Needed: Independent      CARE Score - Wheel 50 Feet with Two Turns: 6    Type of Wheelchair/Scooter: Manual Assistance Needed: Independent      CARE Score - Wheel 50 Feet with Two Turns: 6    Type of Wheelchair/Scooter: Manual   Wheel 150 Feet Assistance Needed: Physical assistance 26%-50%    CARE Score - Wheel 150 Feet: 3    Type of Wheelchair/Scooter: Manual  Assistance Needed: Physical assistance      CARE Score - Wheel 150 Feet: 3    Type of Wheelchair/Scooter: Manual

## 2023-04-17 NOTE — Unmapped (Signed)
Alert and oriented x4. Patient is tolerating cyclic tube feed (4pm-8am) with Nutren 2.0 at the rate of 91ml/hr and 75ml flush every three hours. Rt forearm PIV intact. Most meds given via L nare NG tube and a few via IV. Pain continuous to be managed with scheduled pain medications and PRN Dilaudid and oxycodone. Nausea managed with scheduled Phenergan . No skin changes noted. Safety precautions maintained. Mother at bedside and involved in patient care. Hourly rounding performed by nursing staff.     Problem: Rehabilitation (IRF) Plan of Care  Goal: Plan of Care Review  04/17/2023 1746 by Fransisco Beau, RN  Outcome: Ongoing - Unchanged  Flowsheets (Taken 04/17/2023 1746)  Progress: no change  Plan of Care Reviewed With: patient  04/17/2023 1745 by Fransisco Beau, RN  Outcome: Ongoing - Unchanged  Flowsheets (Taken 04/17/2023 1745)  Progress: improving  Plan of Care Reviewed With: patient  Goal: Patient-Specific Goal (Individualized)  04/17/2023 1746 by Fransisco Beau, RN  Outcome: Ongoing - Unchanged  04/17/2023 1745 by Fransisco Beau, RN  Outcome: Ongoing - Unchanged  Goal: Absence of New-Onset Illness or Injury  04/17/2023 1746 by Fransisco Beau, RN  Outcome: Ongoing - Unchanged  04/17/2023 1745 by Fransisco Beau, RN  Outcome: Ongoing - Unchanged  Intervention: Prevent Fall and Fall Injury  Recent Flowsheet Documentation  Taken 04/17/2023 1238 by Fransisco Beau, RN  Safety Interventions: fall reduction program maintained  Taken 04/17/2023 0800 by Fransisco Beau, RN  Safety Interventions: fall reduction program maintained  Intervention: Prevent VTE (Venous Thromboembolism)  Recent Flowsheet Documentation  Taken 04/17/2023 0900 by Fransisco Beau, RN  VTE Prevention/Management: anticoagulant therapy  Goal: Optimal Comfort and Wellbeing  04/17/2023 1746 by Fransisco Beau, RN  Outcome: Ongoing - Unchanged  04/17/2023 1745 by Fransisco Beau, RN  Outcome: Ongoing - Unchanged  Goal: Home and Community Transition Plan Established  04/17/2023 1746 by Fransisco Beau, RN  Outcome: Ongoing - Unchanged  04/17/2023 1745 by Fransisco Beau, RN  Outcome: Ongoing - Unchanged  Goal: Rounds/Family Conference  04/17/2023 1746 by Fransisco Beau, RN  Outcome: Ongoing - Unchanged  04/17/2023 1745 by Fransisco Beau, RN  Outcome: Ongoing - Unchanged

## 2023-04-18 MED ADMIN — guar gum (NUTRISOURCE) 1 packet: 1 | ENTERAL | @ 19:00:00

## 2023-04-18 MED ADMIN — guar gum (NUTRISOURCE) 1 packet: 1 | ENTERAL | @ 15:00:00

## 2023-04-18 MED ADMIN — ondansetron (ZOFRAN) injection 8 mg: 8 mg | INTRAVENOUS | @ 01:00:00

## 2023-04-18 MED ADMIN — promethazine (PHENERGAN) 12.5 mg in sodium chloride 0.9 % 50 mL IVPB (premix): 12.5 mg | INTRAVENOUS | @ 23:00:00

## 2023-04-18 MED ADMIN — sodium chloride (NS) 0.9 % flush 10 mL: 10 mL | INTRAVENOUS | @ 10:00:00

## 2023-04-18 MED ADMIN — apixaban (ELIQUIS) tablet 5 mg: 5 mg | ORAL | @ 15:00:00

## 2023-04-18 MED ADMIN — acetaminophen (TYLENOL) oral liquid: 1000 mg | ENTERAL | @ 19:00:00

## 2023-04-18 MED ADMIN — sodium chloride (NS) 0.9 % flush 10 mL: 10 mL | INTRAVENOUS | @ 03:00:00

## 2023-04-18 MED ADMIN — multivitamin with folic acid 400 mcg tablet 1 tablet: 1 | ORAL | @ 15:00:00

## 2023-04-18 MED ADMIN — baclofen (LIORESAL) tablet 5 mg: 5 mg | ORAL | @ 03:00:00

## 2023-04-18 MED ADMIN — baclofen (LIORESAL) tablet 5 mg: 5 mg | ORAL | @ 19:00:00

## 2023-04-18 MED ADMIN — baclofen (LIORESAL) tablet 5 mg: 5 mg | ORAL | @ 15:00:00

## 2023-04-18 MED ADMIN — oxyCODONE (ROXICODONE) 5 mg/5 mL solution 20 mg: 20 mg | ORAL | @ 07:00:00 | Stop: 2023-04-24

## 2023-04-18 MED ADMIN — apixaban (ELIQUIS) tablet 5 mg: 5 mg | ORAL | @ 03:00:00

## 2023-04-18 MED ADMIN — escitalopram oxalate (LEXAPRO) tablet 5 mg: 5 mg | ENTERAL | @ 15:00:00

## 2023-04-18 MED ADMIN — ondansetron (ZOFRAN) injection 8 mg: 8 mg | INTRAVENOUS | @ 17:00:00

## 2023-04-18 MED ADMIN — HYDROmorphone (PF) (DILAUDID) injection 1 mg: 1 mg | INTRAVENOUS | @ 17:00:00 | Stop: 2023-04-22

## 2023-04-18 MED ADMIN — HYDROmorphone (PF) (DILAUDID) injection 1 mg: 1 mg | INTRAVENOUS | @ 10:00:00 | Stop: 2023-04-22

## 2023-04-18 MED ADMIN — oxyCODONE (ROXICODONE) 5 mg/5 mL solution 20 mg: 20 mg | ORAL | @ 22:00:00 | Stop: 2023-04-24

## 2023-04-18 MED ADMIN — HYDROmorphone (PF) (DILAUDID) injection 1 mg: 1 mg | INTRAVENOUS | @ 23:00:00 | Stop: 2023-04-22

## 2023-04-18 MED ADMIN — oxyCODONE (ROXICODONE) 5 mg/5 mL solution 20 mg: 20 mg | ORAL | @ 15:00:00 | Stop: 2023-04-24

## 2023-04-18 MED ADMIN — acetaminophen (TYLENOL) oral liquid: 1000 mg | ENTERAL | @ 03:00:00

## 2023-04-18 MED ADMIN — promethazine (PHENERGAN) 12.5 mg in sodium chloride 0.9 % 50 mL IVPB (premix): 12.5 mg | INTRAVENOUS | @ 05:00:00

## 2023-04-18 MED ADMIN — pantoprazole (Protonix) injection 40 mg: 40 mg | INTRAVENOUS | @ 03:00:00

## 2023-04-18 MED ADMIN — guar gum (NUTRISOURCE) 1 packet: 1 | ENTERAL | @ 03:00:00

## 2023-04-18 MED ADMIN — oxyCODONE (ROXICODONE) 5 mg/5 mL solution 20 mg: 20 mg | ORAL | @ 01:00:00 | Stop: 2023-04-24

## 2023-04-18 MED ADMIN — cetirizine (ZYRTEC) oral syrup: 20 mg | ENTERAL | @ 15:00:00

## 2023-04-18 MED ADMIN — famotidine (PEPCID) oral suspension: 20 mg | ENTERAL | @ 03:00:00

## 2023-04-18 MED ADMIN — sodium chloride (NS) 0.9 % flush 10 mL: 10 mL | INTRAVENOUS | @ 18:00:00

## 2023-04-18 MED ADMIN — promethazine (PHENERGAN) 12.5 mg in sodium chloride 0.9 % 50 mL IVPB (premix): 12.5 mg | INTRAVENOUS | @ 18:00:00

## 2023-04-18 MED ADMIN — ammonium lactate (LAC-HYDRIN) 12 % lotion 1 Application: 1 | TOPICAL | @ 15:00:00

## 2023-04-18 MED ADMIN — lidocaine (ASPERCREME) 4 % 1 patch: 1 | TRANSDERMAL | @ 15:00:00

## 2023-04-18 MED ADMIN — acetaminophen (TYLENOL) oral liquid: 1000 mg | ENTERAL | @ 10:00:00

## 2023-04-18 MED ADMIN — HYDROmorphone (PF) (DILAUDID) injection 1 mg: 1 mg | INTRAVENOUS | @ 05:00:00 | Stop: 2023-04-22

## 2023-04-18 MED ADMIN — pantoprazole (Protonix) injection 40 mg: 40 mg | INTRAVENOUS | @ 15:00:00

## 2023-04-18 MED ADMIN — oxyCODONE (ROXICODONE) 5 mg/5 mL solution 20 mg: 20 mg | ORAL | @ 19:00:00 | Stop: 2023-04-24

## 2023-04-18 MED ADMIN — cefdinir (OMNICEF) oral suspension: 300 mg | ORAL | @ 15:00:00 | Stop: 2023-05-06

## 2023-04-18 MED ADMIN — promethazine (PHENERGAN) 12.5 mg in sodium chloride 0.9 % 50 mL IVPB (premix): 12.5 mg | INTRAVENOUS | @ 10:00:00

## 2023-04-18 NOTE — Unmapped (Signed)
Problem: Rehabilitation (IRF) Plan of Care  Goal: Plan of Care Review  Outcome: Ongoing - Unchanged  Flowsheets (Taken 04/18/2023 1713)  Progress: no change  Plan of Care Reviewed With:   patient   parent  Goal: Patient-Specific Goal (Individualized)  Outcome: Ongoing - Unchanged  Goal: Absence of New-Onset Illness or Injury  Outcome: Ongoing - Unchanged  Intervention: Prevent Fall and Fall Injury  Recent Flowsheet Documentation  Taken 04/18/2023 1630 by Fransisco Beau, RN  Safety Interventions: fall reduction program maintained  Taken 04/18/2023 1407 by Fransisco Beau, RN  Safety Interventions: fall reduction program maintained  Taken 04/18/2023 1237 by Fransisco Beau, RN  Safety Interventions: fall reduction program maintained  Taken 04/18/2023 1000 by Fransisco Beau, RN  Safety Interventions: fall reduction program maintained  Taken 04/18/2023 0800 by Fransisco Beau, RN  Safety Interventions: fall reduction program maintained  Intervention: Prevent Skin Injury  Recent Flowsheet Documentation  Taken 04/18/2023 1000 by Fransisco Beau, RN  Skin Protection: adhesive use limited  Taken 04/18/2023 0915 by Fransisco Beau, RN  Device Skin Pressure Protection: absorbent pad utilized/changed  Skin Protection: incontinence pads utilized  Taken 04/18/2023 0800 by Fransisco Beau, RN  Skin Protection: adhesive use limited  Intervention: Prevent VTE (Venous Thromboembolism)  Recent Flowsheet Documentation  Taken 04/18/2023 0915 by Fransisco Beau, RN  VTE Prevention/Management: anticoagulant therapy  Goal: Optimal Comfort and Wellbeing  Outcome: Ongoing - Unchanged  Goal: Home and Community Transition Plan Established  Outcome: Ongoing - Unchanged  Goal: Rounds/Family Conference  Outcome: Ongoing - Unchanged

## 2023-04-18 NOTE — Unmapped (Signed)
Problem: Rehabilitation (IRF) Plan of Care  Goal: Absence of New-Onset Illness or Injury  04/18/2023 0414 by Sharlett Iles, RN  Outcome: Progressing  04/18/2023 0034 by Sharlett Iles, RN  Outcome: Progressing  Intervention: Prevent Fall and Fall Injury  Recent Flowsheet Documentation  Taken 04/18/2023 0400 by Sharlett Iles, RN  Safety Interventions:   fall reduction program maintained   family at bedside  Taken 04/18/2023 0200 by Sharlett Iles, RN  Safety Interventions:   fall reduction program maintained   family at bedside  Taken 04/18/2023 0000 by Sharlett Iles, RN  Safety Interventions:   fall reduction program maintained   family at bedside  Taken 04/17/2023 2200 by Sharlett Iles, RN  Safety Interventions:   environmental modification   family at bedside  Taken 04/17/2023 2000 by Sharlett Iles, RN  Safety Interventions:   fall reduction program maintained   family at bedside  Taken 04/17/2023 1925 by Sharlett Iles, RN  Safety Interventions: family at bedside  Goal: Optimal Comfort and Wellbeing  04/18/2023 0414 by Sharlett Iles, RN  Outcome: Progressing  04/18/2023 0034 by Sharlett Iles, RN  Outcome: Progressing     Problem: Latex Allergy  Goal: Absence of Allergy Symptoms  Outcome: Progressing     Problem: Fall Injury Risk  Goal: Absence of Fall and Fall-Related Injury  04/18/2023 0414 by Sharlett Iles, RN  Outcome: Progressing  04/18/2023 0034 by Sharlett Iles, RN  Outcome: Progressing  Intervention: Promote Injury-Free Environment  Recent Flowsheet Documentation  Taken 04/18/2023 0400 by Sharlett Iles, RN  Safety Interventions:   fall reduction program maintained   family at bedside  Taken 04/18/2023 0200 by Sharlett Iles, RN  Safety Interventions:   fall reduction program maintained   family at bedside  Taken 04/18/2023 0000 by Sharlett Iles, RN  Safety Interventions:   fall reduction program maintained   family at bedside  Taken 04/17/2023 2200 by Sharlett Iles, RN  Safety Interventions:   environmental modification family at bedside  Taken 04/17/2023 2000 by Sharlett Iles, RN  Safety Interventions:   fall reduction program maintained   family at bedside  Taken 04/17/2023 1925 by Sharlett Iles, RN  Safety Interventions: family at bedside     Problem: Malnutrition  Goal: Improved Nutritional Intake  04/18/2023 0414 by Sharlett Iles, RN  Outcome: Progressing  04/18/2023 0034 by Sharlett Iles, RN  Outcome: Progressing

## 2023-04-18 NOTE — Unmapped (Signed)
Physical Medicine and Rehabilitation  Daily Progress Note [facility]    ASSESSMENT:     [name] [name] is a 23 y.o. female 23 y.o. female with PMH of [name] s/p proctocolectomy and J-pouch, desmoid tumours, chronic abdominal pain and nausea, anxiety, PTSD related to past medical care, DVT RUE s/p Eliquis x3 months admitted for constellation of worsening pain, intolerance of oral intake. She is now admitted to [facility] for comprehensive interdisciplinary rehabilitation.      Rehab Impairment Group Code [facility]):   (Debility) 16 Debility (Non-Cardiac/Non-Pulmonary)   Etiology: worsening pain, intolerance of oral intake     PLAN:     This patient is admitted to the Physical Medicine and Rehabilitation - Inpatient - A service from 8am-5pm on weekdays for questions regarding this patient. After hours, weekends, and holidays please contact the 1st Call resident pager     REHAB:   - PT and OT to maximize functional status with mobility and ADLs as well as prevention of joint contracture.   - P&O for assistive devices PRN.  - To be discussed in weekly Interdisciplinary Team Conference.     Acute on chronic abdominal pain and nausea  Complex situation discussed at length in prior notes. In brief, her pain and nausea are (deeply) multifactorial but consensus among treatment team is that new / unknown organic issue is unlikely. Plan to focus on symptom control, adequate nutrition.  Patient is currently at goal tube feeding, tolerating well.  No visible emesis noted.  Still reports persistent nausea but on aggressive nausea management as below. Chronic pain service consulted, appreciated.  - NO CHANGES to pain regimen without multiparty discussion including patient, unless true emergency.  Seen by chronic pain 1/28, recommended to continue the pain regimen as below no changes made.  - Pain management:  -- oxycodone (liquid) 20 mg q4 prn-first-line for pain  -- changed hydromorphone 1 mg IV q6prn -second line for pain  (ONLY IF inadequate relief 1 hour after oral alternative)  -- buprenorphine 20 mcg/h patch   -- hyoscyamine prn  -- Baclofen 5mg  TID  - Nausea: discuss about transitioning one of the IV nausea medication to p.o.  -- ondansetron IV q8 PRN  -- promethazine IV q6   -- scopolamine patch  -- olanzapine 5 mg in evening  -- no diphenhydramine IV in absence of clear indication  - Dyspepsia:  -- famotidine bid  -- PPI IV q12  - GI motility:  -- naloxegol prn  -- okay to use loperamide sparingly     Left arm PICC line site swelling, pain and ecchymosis - DVT  Left arm PICC line site noted mild swelling and mild ecchymosis from insertion.  No visible swelling noted below the elbow.  PVL confirmed acute DVT, started on anticoagulation and removed PICC line on 1/28, given acute DVT also patient is currently not on any IV antibiotic. Removed PICC line 1/28,  swelling around prior picc line site improving, mild redness/ rash  due to tape, still has bruises but resolving.   - Eliquis 5mg  BID; will need 3 months of therapy  - Stopped lovenox     Inadequate oral intake  Abnormal gut anatomy notwithstanding, it is the consensus among hospitalist and GI services that oral and/or enteral feeding should be sufficient to meet caloric and hydration needs. Has not developed refeeding syndrome. TPN will not be offered in forseeable future.  - titrate tube feeds to ~100% USRDI = 65 mL/h: reached at 60cc/hr- tolerated well.   -  Per dietitian, patient requested for cyclic feeding , started on cyclic feeding with Nutren 2.0, at goal rate 50 cc/h for 16 hours with acute 4-hour water flushes 150 cc .   - Calorie count per nutrition  - follow BMP, Mg phos q48-72, stop once at goal feeds  - GI to follow and manage wean from enteral nutrition at discharge     Elevated transaminases  Improving: Labs with AST 21, ALT 70, downtrending from prior. No evidence of cholestasis or synthetic dysfunction. Presume drug-related. No longer following closely as trend is well established.     Pouchitis (dx 03/2023), resolving  No plan for repeat endoscopy or colonoscopy during this admission, per GI, given recent exam. No significant decrement in Hb so far  - Cefdinir 300 mg daily (end = 05/05/2023)  - Completed Cefdinir 300 mg bid for 4 weeks (end = 04/07/2023)  - CBC weekly     Desmoid tumors  - okay to hold nirogesat while having difficulty with oral meds, no urgency to resume per Dr [name]     Mood / anxiety  - continue escitalopram  - lorazepam 1 mg bid prn (home medication)     Daily Checklist  - Diet: Regular diet with cyclic tube feeds  - DVT PPX: eliquis  - GI PPX: IV protonix BID  - Access: piv    DME needs  - To be determined    DISPO: Patient to be discussed at weekly interdisciplinary team conference.   - EDD: 2/13  - Follow-up: PCP, PM&R, GI, Oncology    SUBJECTIVE:     Interval Events:   2/8: She notes difficulty sleeping last night due to increased peristalsis.  We discussed this is likely due to upright positioning and mobility with therapy.  She also notes perceived improvement when mobilizing with AFOs, we discussed communicating with PT and P&O on Monday.    OBJECTIVE:     Vital signs (last 24 hours):  Temp:  [36.5 ??C (97.7 ??F)-36.6 ??C (97.9 ??F)] 36.6 ??C (97.9 ??F)  Heart Rate:  [71-87] 82  Resp:  [18] 18  BP: (108-164)/(52-105) 108/52  MAP (mmHg):  [68-119] 68  SpO2:  [97 %-98 %] 97 %    Intake/Output (last 3 shifts):  I/O last 3 completed shifts:  In: 1175 [I.V.:30; NG/GT:1145]  Out: -     Physical Exam:   GEN: NAD, resting comfortably in bed with mother at bedside  EYES: sclera anicteric, conjunctiva clear   HEENT: MMM, NG tube in place  NECK: trachea midline  CV: warm extremities, well perfused, no cyanosis   RESP:  NWOB on RA  ABD: Not distended  SKIN: no visible masses, lesions, rashes, ecchymoses.   MSK: no notable contractures or ext swelling  NEURO:Alert, MAE, speech fluid and coherent, responds appropriately to questions   PSYCH: mood euthymic, affect appropriate, thought process logical         Medications:  Scheduled    acetaminophen (TYLENOL) oral liquid Q8H SCH    ammonium lactate (LAC-HYDRIN) 12 % lotion 1 Application BID    apixaban (ELIQUIS) tablet 5 mg BID    baclofen (LIORESAL) tablet 5 mg TID    buprenorphine 20 mcg/hour transdermal patch 1 patch Weekly    cefdinir (OMNICEF) oral suspension Daily    cetirizine (ZYRTEC) oral syrup BID    escitalopram oxalate (LEXAPRO) tablet 5 mg Daily    famotidine (PEPCID) oral suspension Nightly    fluticasone propionate (FLONASE) 50 mcg/actuation nasal spray 1 spray Weekly  guar gum (NUTRISOURCE) 1 packet TID    lidocaine (ASPERCREME) 4 % 1 patch Daily    multivitamin with folic acid 400 mcg tablet 1 tablet Daily    OLANZapine zydis (ZYPREXA) disintegrating tablet 5 mg Q PM    pantoprazole (Protonix) injection 40 mg BID    promethazine (PHENERGAN) 12.5 mg in sodium chloride 0.9 % 50 mL IVPB (premix) Q6H    scopolamine (TRANSDERM-SCOP) 1 mg over 3 days topical patch 1 mg Q72H    sodium chloride (NS) 0.9 % flush 10 mL Q8H     PRN hydrocortisone, , BID PRN  hydrocortisone, , BID PRN  HYDROmorphone, 1 mg, Q6H PRN  hyoscyamine, 125 mcg, Q4H PRN  loperamide, 2 mg, QID PRN  LORazepam, 1 mg, BID PRN  melatonin, 3 mg, Nightly PRN  naloxegol, 12.5 mg, Daily PRN  ondansetron, 8 mg, Q8H PRN  oxyCODONE, 20 mg, Q4H PRN  zinc oxide-cod liver oil, , Daily PRN          Labs/Studies: Reviewed     Radiology Results:   No results found.        Quality Indicators      Hearing, Speech, and Vision  Ability to Hear: Adequate  Ability to See in Adequate Light: Adequate  Expression of Ideas and Wants: Without difficulty  Understanding Verbal and Non-Verbal Content: Understands    Cognitive Pattern Assessment  Cognitive Pattern Assessment Used: BIMS  Brief Interview for Mental Status (BIMS)  Repetition of Three Words (First Attempt): 3  Temporal Orientation: Year: Correct  Temporal Orientation: Month: Accurate within 5 days  Temporal Orientation: Day: Correct  Recall: Sock: Yes, no cue required  Recall: Blue: Yes, no cue required  Recall: Bed: Yes, no cue required  BIMS Summary Score: 15       Nutritional Approaches  Nutritional Approach: Feeding tube    ADLs  Admission Current   Eating Assistance Needed: Set-up / clean-up    CARE Score - 5 Assistance Needed: Set-up / clean-up    CARE Score - 5   Oral Hygiene  Assistance Needed: Physical assistance Total assistance  CARE Score - 1  Assistance Needed: Physical assistance    CARE Score - 1   Bladder Continence       Bowel Continence       Toileting Hygiene Assistance Needed: Incidental touching    CARE Score - 4 Assistance Needed: Incidental touching    CARE Score - 4    Toilet Transfer Assistance Needed: Physical assistance 25% or less  CARE Score - 3 Assistance Needed: Physical assistance    CARE Score - 3   Shower/Bathe Self      CARE Score - 88       CARE Score - 88   Upper Body Dressing Assistance Needed: Physical assistance 26%-50%  CARE Score - 3  Assistance Needed: Independent    CARE Score - 6   Lower Body Dressing Assistance Needed: Physical assistance 76% or more  CARE Score - 2  Assistance Needed: Physical assistance    CARE Score - 2   On/Off Footwear Assistance Needed: Physical assistance Total assistance  CARE Score - 1  Assistance Needed: Physical assistance    CARE Score - 1         Transfers Admission Current   Bed to Chair Assistance Needed: Incidental touching    CARE Score - 4  Assistance Needed: Incidental touching    CARE Score - 4     Lying to Sitting Assistance  Needed: Physical assistance 26%-50%  CARE Score - 3  Assistance Needed: Physical assistance    CARE Score - 3   Roll Left/Right Assistance Needed: Physical assistance 25% or less  CARE Score - 3  Assistance Needed: Physical assistance    CARE Score - 3   Sit to Lying Assistance Needed: Physical assistance 26%-50%  CARE Score - 3  Assistance Needed: Physical assistance    CARE Score - 3   Sit to Stand Assistance Needed: Incidental touching    CARE Score - 4  Assistance Needed: Incidental touching    CARE Score - 4         Mobility Admission Current   Walk 10 Feet Assistance Needed: Adaptive equipment, Physical assistance 25% or less  CARE Score - 3 Assistance Needed: Adaptive equipment, Physical assistance    CARE Score - 3     Walk 50 Feet 2 Turns Assistance Needed: Physical assistance 51%-75%  CARE Score - 2 Assistance Needed: Physical assistance    CARE Score - 2   Walk 150 Feet      CARE Score - 88       CARE Score - 88   Walk 10 Feet Uneven      CARE Score - 88       CARE Score - 88   1 Step (Curb)      CARE Score - 88       CARE Score - 88   4 Steps      CARE Score - 88       CARE Score - 88   12 Steps      CARE Score - 88       CARE Score - 88   Picking Up Object      CARE Score - 10       CARE Score - 10   Wheelchair/Scooter Use        Wheel 50 Feet 2 Turns Assistance Needed: Independent      CARE Score - Wheel 50 Feet with Two Turns: 6    Type of Wheelchair/Scooter: Manual Assistance Needed: Independent      CARE Score - Wheel 50 Feet with Two Turns: 6    Type of Wheelchair/Scooter: Manual   Wheel 150 Feet Assistance Needed: Physical assistance 26%-50%    CARE Score - Wheel 150 Feet: 3    Type of Wheelchair/Scooter: Manual  Assistance Needed: Physical assistance      CARE Score - Wheel 150 Feet: 3    Type of Wheelchair/Scooter: Manual

## 2023-04-19 DIAGNOSIS — D48119 Desmoid tumor: Principal | ICD-10-CM

## 2023-04-19 MED ADMIN — promethazine (PHENERGAN) 12.5 mg in sodium chloride 0.9 % 50 mL IVPB (premix): 12.5 mg | INTRAVENOUS | @ 23:00:00

## 2023-04-19 MED ADMIN — cetirizine (ZYRTEC) oral syrup: 20 mg | ENTERAL | @ 14:00:00

## 2023-04-19 MED ADMIN — LORazepam (ATIVAN) tablet 1 mg: 1 mg | ORAL | @ 23:00:00

## 2023-04-19 MED ADMIN — scopolamine (TRANSDERM-SCOP) 1 mg over 3 days topical patch 1 mg: 1 | TOPICAL | @ 15:00:00

## 2023-04-19 MED ADMIN — oxyCODONE (ROXICODONE) 5 mg/5 mL solution 20 mg: 20 mg | ORAL | Stop: 2023-04-24

## 2023-04-19 MED ADMIN — oxyCODONE (ROXICODONE) 5 mg/5 mL solution 20 mg: 20 mg | ORAL | @ 13:00:00 | Stop: 2023-04-24

## 2023-04-19 MED ADMIN — promethazine (PHENERGAN) 12.5 mg in sodium chloride 0.9 % 50 mL IVPB (premix): 12.5 mg | INTRAVENOUS | @ 11:00:00

## 2023-04-19 MED ADMIN — sodium chloride (NS) 0.9 % flush 10 mL: 10 mL | INTRAVENOUS | @ 03:00:00

## 2023-04-19 MED ADMIN — lidocaine (ASPERCREME) 4 % 1 patch: 1 | TRANSDERMAL | @ 14:00:00

## 2023-04-19 MED ADMIN — oxyCODONE (ROXICODONE) 5 mg/5 mL solution 20 mg: 20 mg | ORAL | @ 20:00:00 | Stop: 2023-04-24

## 2023-04-19 MED ADMIN — baclofen (LIORESAL) tablet 5 mg: 5 mg | ORAL | @ 20:00:00

## 2023-04-19 MED ADMIN — acetaminophen (TYLENOL) oral liquid: 1000 mg | ENTERAL | @ 03:00:00

## 2023-04-19 MED ADMIN — escitalopram oxalate (LEXAPRO) tablet 5 mg: 5 mg | ENTERAL | @ 14:00:00

## 2023-04-19 MED ADMIN — HYDROmorphone (PF) (DILAUDID) injection 1 mg: 1 mg | INTRAVENOUS | @ 05:00:00 | Stop: 2023-04-22

## 2023-04-19 MED ADMIN — baclofen (LIORESAL) tablet 5 mg: 5 mg | ORAL | @ 14:00:00

## 2023-04-19 MED ADMIN — guar gum (NUTRISOURCE) 1 packet: 1 | ENTERAL | @ 20:00:00

## 2023-04-19 MED ADMIN — acetaminophen (TYLENOL) oral liquid: 1000 mg | ENTERAL | @ 11:00:00

## 2023-04-19 MED ADMIN — oxyCODONE (ROXICODONE) 5 mg/5 mL solution 20 mg: 20 mg | ORAL | @ 03:00:00 | Stop: 2023-04-24

## 2023-04-19 MED ADMIN — guar gum (NUTRISOURCE) 1 packet: 1 | ENTERAL | @ 03:00:00

## 2023-04-19 MED ADMIN — acetaminophen (TYLENOL) oral liquid: 1000 mg | ENTERAL | @ 20:00:00

## 2023-04-19 MED ADMIN — HYDROmorphone (PF) (DILAUDID) injection 1 mg: 1 mg | INTRAVENOUS | @ 17:00:00 | Stop: 2023-04-22

## 2023-04-19 MED ADMIN — pantoprazole (Protonix) injection 40 mg: 40 mg | INTRAVENOUS | @ 14:00:00

## 2023-04-19 MED ADMIN — apixaban (ELIQUIS) tablet 5 mg: 5 mg | ORAL | @ 14:00:00

## 2023-04-19 MED ADMIN — promethazine (PHENERGAN) 12.5 mg in sodium chloride 0.9 % 50 mL IVPB (premix): 12.5 mg | INTRAVENOUS | @ 05:00:00

## 2023-04-19 MED ADMIN — multivitamin with folic acid 400 mcg tablet 1 tablet: 1 | ORAL | @ 14:00:00

## 2023-04-19 MED ADMIN — ammonium lactate (LAC-HYDRIN) 12 % lotion 1 Application: 1 | TOPICAL | @ 15:00:00

## 2023-04-19 MED ADMIN — baclofen (LIORESAL) tablet 5 mg: 5 mg | ORAL | @ 03:00:00

## 2023-04-19 MED ADMIN — cefdinir (OMNICEF) oral suspension: 300 mg | ORAL | @ 14:00:00 | Stop: 2023-05-06

## 2023-04-19 MED ADMIN — HYDROmorphone (PF) (DILAUDID) injection 1 mg: 1 mg | INTRAVENOUS | @ 11:00:00 | Stop: 2023-04-22

## 2023-04-19 MED ADMIN — HYDROmorphone (PF) (DILAUDID) injection 1 mg: 1 mg | INTRAVENOUS | @ 23:00:00 | Stop: 2023-04-22

## 2023-04-19 MED ADMIN — ammonium lactate (LAC-HYDRIN) 12 % lotion 1 Application: 1 | TOPICAL | @ 03:00:00

## 2023-04-19 MED ADMIN — apixaban (ELIQUIS) tablet 5 mg: 5 mg | ORAL | @ 03:00:00

## 2023-04-19 MED ADMIN — guar gum (NUTRISOURCE) 1 packet: 1 | ENTERAL | @ 14:00:00

## 2023-04-19 MED ADMIN — pantoprazole (Protonix) injection 40 mg: 40 mg | INTRAVENOUS | @ 03:00:00

## 2023-04-19 MED ADMIN — sodium chloride (NS) 0.9 % flush 10 mL: 10 mL | INTRAVENOUS | @ 11:00:00

## 2023-04-19 MED ADMIN — famotidine (PEPCID) oral suspension: 20 mg | ENTERAL | @ 03:00:00

## 2023-04-19 MED ADMIN — sodium chloride (NS) 0.9 % flush 10 mL: 10 mL | INTRAVENOUS | @ 22:00:00

## 2023-04-19 MED ADMIN — promethazine (PHENERGAN) 12.5 mg in sodium chloride 0.9 % 50 mL IVPB (premix): 12.5 mg | INTRAVENOUS | @ 17:00:00

## 2023-04-19 MED ADMIN — ondansetron (ZOFRAN) injection 8 mg: 8 mg | INTRAVENOUS | @ 19:00:00

## 2023-04-19 NOTE — Unmapped (Signed)
Problem: Rehabilitation (IRF) Plan of Care  Goal: Optimal Comfort and Wellbeing  Outcome: Not Progressing  Note: Patient reported pain Flare this afternoon. On call team aware. No new orders. Patient expressed her dissatisfaction with the plan.      Problem: Rehabilitation (IRF) Plan of Care  Goal: Absence of New-Onset Illness or Injury  Intervention: Prevent Skin Injury  Recent Flowsheet Documentation  Taken 04/19/2023 1000 by Christiane Ha, RN  Skin Protection: incontinence pads utilized     Problem: Fall Injury Risk  Goal: Absence of Fall and Fall-Related Injury  Outcome: Ongoing - Unchanged  Intervention: Promote Scientist, clinical (histocompatibility and immunogenetics) Documentation  Taken 04/19/2023 1400 by Christiane Ha, RN  Safety Interventions: fall reduction program maintained  Taken 04/19/2023 1200 by Christiane Ha, RN  Safety Interventions: fall reduction program maintained  Taken 04/19/2023 1000 by Christiane Ha, RN  Safety Interventions:   fall reduction program maintained   family at bedside  Taken 04/19/2023 0800 by Christiane Ha, RN  Safety Interventions: fall reduction program maintained     Problem: Malnutrition  Goal: Improved Nutritional Intake  Outcome: Ongoing - Unchanged

## 2023-04-19 NOTE — Unmapped (Signed)
Alert and oriented x 4. Cooperative and pleasant. Pt complained of nausea and abdominal pain. Phenergan and dilaudid IV alternating with oxy and zofran with good relief. Tolerating tube feeds via NGT. Poor appetite continues because of nausea. No falls reported. Skin intact. Continue plan of care.     Problem: Rehabilitation (IRF) Plan of Care  Goal: Plan of Care Review  Outcome: Progressing  Goal: Patient-Specific Goal (Individualized)  Outcome: Progressing  Goal: Absence of New-Onset Illness or Injury  Outcome: Progressing  Intervention: Prevent Fall and Fall Injury  Recent Flowsheet Documentation  Taken 04/18/2023 2215 by Gwenyth Bender, RN  Safety Interventions:   fall reduction program maintained   family at bedside  Taken 04/18/2023 2000 by Gwenyth Bender, RN  Safety Interventions: fall reduction program maintained  Intervention: Prevent Skin Injury  Recent Flowsheet Documentation  Taken 04/18/2023 2215 by Gwenyth Bender, RN  Device Skin Pressure Protection: absorbent pad utilized/changed  Skin Protection: incontinence pads utilized  Intervention: Prevent VTE (Venous Thromboembolism)  Recent Flowsheet Documentation  Taken 04/18/2023 2215 by Gwenyth Bender, RN  VTE Prevention/Management: anticoagulant therapy  Goal: Optimal Comfort and Wellbeing  Outcome: Progressing  Goal: Home and Community Transition Plan Established  Outcome: Progressing  Goal: Rounds/Family Conference  Outcome: Progressing     Problem: Self-Care Deficit  Goal: Improved Ability to Complete Activities of Daily Living  Outcome: Progressing  Intervention: Promote Activity and Functional Independence  Recent Flowsheet Documentation  Taken 04/18/2023 2215 by Gwenyth Bender, RN  Self-Care Promotion: independence encouraged     Problem: Fall Injury Risk  Goal: Absence of Fall and Fall-Related Injury  Outcome: Progressing  Intervention: Identify and Manage Contributors  Recent Flowsheet Documentation  Taken 04/18/2023 2215 by Gwenyth Bender, RN  Self-Care Promotion: independence encouraged  Intervention: Promote Injury-Free Environment  Recent Flowsheet Documentation  Taken 04/18/2023 2215 by Gwenyth Bender, RN  Safety Interventions:   fall reduction program maintained   family at bedside  Taken 04/18/2023 2000 by Gwenyth Bender, RN  Safety Interventions: fall reduction program maintained     Problem: Malnutrition  Goal: Improved Nutritional Intake  Outcome: Progressing

## 2023-04-19 NOTE — Unmapped (Signed)
Physical Medicine and Rehabilitation  Daily Progress Note [facility]    ASSESSMENT:     [name] [name] is a 23 y.o. female 23 y.o. female with PMH of [name] s/p proctocolectomy and J-pouch, desmoid tumours, chronic abdominal pain and nausea, anxiety, PTSD related to past medical care, DVT RUE s/p Eliquis x3 months admitted for constellation of worsening pain, intolerance of oral intake. She is now admitted to [facility] for comprehensive interdisciplinary rehabilitation.      Rehab Impairment Group Code [facility]):   (Debility) 16 Debility (Non-Cardiac/Non-Pulmonary)   Etiology: worsening pain, intolerance of oral intake     PLAN:     This patient is admitted to the Physical Medicine and Rehabilitation - Inpatient - A service from 8am-5pm on weekdays for questions regarding this patient. After hours, weekends, and holidays please contact the 1st Call resident pager     REHAB:   - PT and OT to maximize functional status with mobility and ADLs as well as prevention of joint contracture.   - P&O for assistive devices PRN.  - To be discussed in weekly Interdisciplinary Team Conference.     Acute on chronic abdominal pain and nausea  Complex situation discussed at length in prior notes. In brief, her pain and nausea are (deeply) multifactorial but consensus among treatment team is that new / unknown organic issue is unlikely. Plan to focus on symptom control, adequate nutrition.  Patient is currently at goal tube feeding, tolerating well.  No visible emesis noted.  Still reports persistent nausea but on aggressive nausea management as below. Chronic pain service consulted, appreciated.  - NO CHANGES to pain regimen without multiparty discussion including patient, unless true emergency.  Seen by chronic pain 1/28, recommended to continue the pain regimen as below no changes made.  - Pain management:  -- oxycodone (liquid) 20 mg q4 prn-first-line for pain  -- changed hydromorphone 1 mg IV q6prn -second line for pain  (ONLY IF inadequate relief 1 hour after oral alternative)  -- buprenorphine 20 mcg/h patch   -- hyoscyamine prn  -- Baclofen 5mg  TID  - Nausea: discuss about transitioning one of the IV nausea medication to p.o.  -- ondansetron IV q8 PRN  -- promethazine IV q6   -- scopolamine patch  -- olanzapine 5 mg in evening  -- no diphenhydramine IV in absence of clear indication  - Dyspepsia:  -- famotidine bid  -- PPI IV q12  - GI motility:  -- naloxegol prn  -- okay to use loperamide sparingly     Left arm PICC line site swelling, pain and ecchymosis - DVT  Left arm PICC line site noted mild swelling and mild ecchymosis from insertion.  No visible swelling noted below the elbow.  PVL confirmed acute DVT, started on anticoagulation and removed PICC line on 1/28, given acute DVT also patient is currently not on any IV antibiotic. Removed PICC line 1/28,  swelling around prior picc line site improving, mild redness/ rash  due to tape, still has bruises but resolving.   - Eliquis 5mg  BID; will need 3 months of therapy  - Stopped lovenox     Inadequate oral intake  Abnormal gut anatomy notwithstanding, it is the consensus among hospitalist and GI services that oral and/or enteral feeding should be sufficient to meet caloric and hydration needs. Has not developed refeeding syndrome. TPN will not be offered in forseeable future.  - titrate tube feeds to ~100% USRDI = 65 mL/h: reached at 60cc/hr- tolerated well.   -  Per dietitian, patient requested for cyclic feeding , started on cyclic feeding with Nutren 2.0, at goal rate 50 cc/h for 16 hours with acute 4-hour water flushes 150 cc .   - Calorie count per nutrition  - follow BMP, Mg phos q48-72, stop once at goal feeds  - GI to follow and manage wean from enteral nutrition at discharge     Elevated transaminases  Improving: Labs with AST 21, ALT 70, downtrending from prior. No evidence of cholestasis or synthetic dysfunction. Presume drug-related. No longer following closely as trend is well established.     Pouchitis (dx 03/2023), resolving  No plan for repeat endoscopy or colonoscopy during this admission, per GI, given recent exam. No significant decrement in Hb so far  - Cefdinir 300 mg daily (end = 05/05/2023)  - Completed Cefdinir 300 mg bid for 4 weeks (end = 04/07/2023)  - CBC weekly     Desmoid tumors  - okay to hold nirogesat while having difficulty with oral meds, no urgency to resume per Dr [name]     Mood / anxiety  - continue escitalopram  - lorazepam 1 mg bid prn (home medication)     Daily Checklist  - Diet: Regular diet with cyclic tube feeds  - DVT PPX: eliquis  - GI PPX: IV protonix BID  - Access: piv    DME needs  - To be determined    DISPO: Patient to be discussed at weekly interdisciplinary team conference.   - EDD: 2/13  - Follow-up: PCP, PM&R, GI, Oncology    SUBJECTIVE:     Interval Events:   2/8: She notes difficulty sleeping last night due to increased peristalsis.  We discussed this is likely due to upright positioning and mobility with therapy.  She also notes perceived improvement when mobilizing with AFOs, we discussed communicating with PT and P&O on Monday.  2/9: Patient states that she is doing better today with reduced abdominal pain.  We discussed the possibility of increasing baclofen tomorrow following therapy assessments.    OBJECTIVE:     Vital signs (last 24 hours):  Temp:  [36.6 ??C (97.9 ??F)-36.7 ??C (98.1 ??F)] 36.7 ??C (98.1 ??F)  Heart Rate:  [79-82] 79  Resp:  [18] 18  BP: (107-112)/(63-68) 112/63  MAP (mmHg):  [77-81] 77  SpO2:  [98 %] 98 %    Intake/Output (last 3 shifts):  I/O last 3 completed shifts:  In: 485 [I.V.:30; NG/GT:455]  Out: -     Physical Exam:   GEN: NAD, resting comfortably in bed  EYES: sclera anicteric, conjunctiva clear   HEENT: MMM, NG tube in place  NECK: trachea midline  CV: warm extremities, well perfused, no cyanosis   RESP:  NWOB on RA  ABD: Not distended  SKIN: no visible masses, lesions, rashes, ecchymoses. MSK: no notable contractures or ext swelling  NEURO:Alert, MAE, speech fluid and coherent, responds appropriately to questions   PSYCH: mood euthymic, affect appropriate, thought process logical         Medications:  Scheduled    acetaminophen (TYLENOL) oral liquid Q8H SCH    ammonium lactate (LAC-HYDRIN) 12 % lotion 1 Application BID    apixaban (ELIQUIS) tablet 5 mg BID    baclofen (LIORESAL) tablet 5 mg TID    buprenorphine 20 mcg/hour transdermal patch 1 patch Weekly    cefdinir (OMNICEF) oral suspension Daily    cetirizine (ZYRTEC) oral syrup BID    escitalopram oxalate (LEXAPRO) tablet 5 mg Daily  famotidine (PEPCID) oral suspension Nightly    fluticasone propionate (FLONASE) 50 mcg/actuation nasal spray 1 spray Weekly    guar gum (NUTRISOURCE) 1 packet TID    lidocaine (ASPERCREME) 4 % 1 patch Daily    multivitamin with folic acid 400 mcg tablet 1 tablet Daily    OLANZapine zydis (ZYPREXA) disintegrating tablet 5 mg Q PM    pantoprazole (Protonix) injection 40 mg BID    promethazine (PHENERGAN) 12.5 mg in sodium chloride 0.9 % 50 mL IVPB (premix) Q6H    scopolamine (TRANSDERM-SCOP) 1 mg over 3 days topical patch 1 mg Q72H    sodium chloride (NS) 0.9 % flush 10 mL Q8H     PRN hydrocortisone, , BID PRN  hydrocortisone, , BID PRN  HYDROmorphone, 1 mg, Q6H PRN  hyoscyamine, 125 mcg, Q4H PRN  loperamide, 2 mg, QID PRN  LORazepam, 1 mg, BID PRN  melatonin, 3 mg, Nightly PRN  naloxegol, 12.5 mg, Daily PRN  ondansetron, 8 mg, Q8H PRN  oxyCODONE, 20 mg, Q4H PRN  zinc oxide-cod liver oil, , Daily PRN          Labs/Studies: Reviewed     Radiology Results:   No results found.        Quality Indicators      Hearing, Speech, and Vision  Ability to Hear: Adequate  Ability to See in Adequate Light: Adequate  Expression of Ideas and Wants: Without difficulty  Understanding Verbal and Non-Verbal Content: Understands    Cognitive Pattern Assessment  Cognitive Pattern Assessment Used: BIMS  Brief Interview for Mental Status (BIMS)  Repetition of Three Words (First Attempt): 3  Temporal Orientation: Year: Correct  Temporal Orientation: Month: Accurate within 5 days  Temporal Orientation: Day: Correct  Recall: Sock: Yes, no cue required  Recall: Blue: Yes, no cue required  Recall: Bed: Yes, no cue required  BIMS Summary Score: 15       Nutritional Approaches  Nutritional Approach: Feeding tube    ADLs  Admission Current   Eating Assistance Needed: Set-up / clean-up    CARE Score - 5 Assistance Needed: Set-up / clean-up    CARE Score - 5   Oral Hygiene  Assistance Needed: Physical assistance Total assistance  CARE Score - 1  Assistance Needed: Physical assistance    CARE Score - 1   Bladder Continence       Bowel Continence       Toileting Hygiene Assistance Needed: Incidental touching    CARE Score - 4 Assistance Needed: Incidental touching    CARE Score - 4    Toilet Transfer Assistance Needed: Physical assistance 25% or less  CARE Score - 3 Assistance Needed: Physical assistance    CARE Score - 3   Shower/Bathe Self      CARE Score - 88       CARE Score - 88   Upper Body Dressing Assistance Needed: Physical assistance 26%-50%  CARE Score - 3  Assistance Needed: Independent    CARE Score - 6   Lower Body Dressing Assistance Needed: Physical assistance 76% or more  CARE Score - 2  Assistance Needed: Physical assistance    CARE Score - 2   On/Off Footwear Assistance Needed: Physical assistance Total assistance  CARE Score - 1  Assistance Needed: Physical assistance    CARE Score - 1         Transfers Admission Current   Bed to Chair Assistance Needed: Incidental touching    CARE Score -  4  Assistance Needed: Incidental touching    CARE Score - 4     Lying to Sitting Assistance Needed: Physical assistance 26%-50%  CARE Score - 3  Assistance Needed: Physical assistance    CARE Score - 3   Roll Left/Right Assistance Needed: Physical assistance 25% or less  CARE Score - 3  Assistance Needed: Physical assistance    CARE Score - 3   Sit to Lying Assistance Needed: Physical assistance 26%-50%  CARE Score - 3  Assistance Needed: Physical assistance    CARE Score - 3   Sit to Stand Assistance Needed: Incidental touching    CARE Score - 4  Assistance Needed: Incidental touching    CARE Score - 4         Mobility Admission Current   Walk 10 Feet Assistance Needed: Adaptive equipment, Physical assistance 25% or less  CARE Score - 3 Assistance Needed: Adaptive equipment, Physical assistance    CARE Score - 3     Walk 50 Feet 2 Turns Assistance Needed: Physical assistance 51%-75%  CARE Score - 2 Assistance Needed: Physical assistance    CARE Score - 2   Walk 150 Feet      CARE Score - 88       CARE Score - 88   Walk 10 Feet Uneven      CARE Score - 88       CARE Score - 88   1 Step (Curb)      CARE Score - 88       CARE Score - 88   4 Steps      CARE Score - 88       CARE Score - 88   12 Steps      CARE Score - 88       CARE Score - 88   Picking Up Object      CARE Score - 10       CARE Score - 10   Wheelchair/Scooter Use        Wheel 50 Feet 2 Turns Assistance Needed: Independent      CARE Score - Wheel 50 Feet with Two Turns: 6    Type of Wheelchair/Scooter: Manual Assistance Needed: Independent      CARE Score - Wheel 50 Feet with Two Turns: 6    Type of Wheelchair/Scooter: Manual   Wheel 150 Feet Assistance Needed: Physical assistance 26%-50%    CARE Score - Wheel 150 Feet: 3    Type of Wheelchair/Scooter: Manual  Assistance Needed: Physical assistance      CARE Score - Wheel 150 Feet: 3    Type of Wheelchair/Scooter: Manual

## 2023-04-20 DIAGNOSIS — D48119 Desmoid tumor: Principal | ICD-10-CM

## 2023-04-20 LAB — CBC
HEMATOCRIT: 34.8 % (ref 34.0–44.0)
HEMOGLOBIN: 11.4 g/dL (ref 11.3–14.9)
MEAN CORPUSCULAR HEMOGLOBIN CONC: 32.6 g/dL (ref 32.0–36.0)
MEAN CORPUSCULAR HEMOGLOBIN: 27.7 pg (ref 25.9–32.4)
MEAN CORPUSCULAR VOLUME: 84.9 fL (ref 77.6–95.7)
MEAN PLATELET VOLUME: 9.8 fL (ref 6.8–10.7)
PLATELET COUNT: 223 10*9/L (ref 150–450)
RED BLOOD CELL COUNT: 4.1 10*12/L (ref 3.95–5.13)
RED CELL DISTRIBUTION WIDTH: 17.2 % — ABNORMAL HIGH (ref 12.2–15.2)
WBC ADJUSTED: 7.3 10*9/L (ref 3.6–11.2)

## 2023-04-20 LAB — PHOSPHORUS: PHOSPHORUS: 4.7 mg/dL (ref 2.4–5.1)

## 2023-04-20 LAB — BASIC METABOLIC PANEL
ANION GAP: 11 mmol/L (ref 5–14)
BLOOD UREA NITROGEN: 11 mg/dL (ref 9–23)
BUN / CREAT RATIO: 22
CALCIUM: 9.6 mg/dL (ref 8.7–10.4)
CHLORIDE: 103 mmol/L (ref 98–107)
CO2: 29.3 mmol/L (ref 20.0–31.0)
CREATININE: 0.5 mg/dL — ABNORMAL LOW (ref 0.55–1.02)
EGFR CKD-EPI (2021) FEMALE: >90 mL/min/{1.73_m2} (ref >=60)
GLUCOSE RANDOM: 102 mg/dL (ref 70–179)
POTASSIUM: 4.1 mmol/L (ref 3.4–4.8)
SODIUM: 143 mmol/L (ref 135–145)

## 2023-04-20 LAB — MAGNESIUM: MAGNESIUM: 1.9 mg/dL (ref 1.6–2.6)

## 2023-04-20 MED ORDER — OGSIVEO 50 MG TABLET
ORAL_TABLET | Freq: Two times a day (BID) | ORAL | 5 refills | 30.00 days
Start: 2023-04-20 — End: ?

## 2023-04-20 MED ADMIN — promethazine (PHENERGAN) 12.5 mg in sodium chloride 0.9 % 50 mL IVPB (premix): 12.5 mg | INTRAVENOUS

## 2023-04-20 MED ADMIN — multivitamin with folic acid 400 mcg tablet 1 tablet: 1 | ORAL | @ 13:00:00

## 2023-04-20 MED ADMIN — pantoprazole (Protonix) injection 40 mg: 40 mg | INTRAVENOUS | @ 02:00:00

## 2023-04-20 MED ADMIN — guar gum (NUTRISOURCE) 1 packet: 1 | ENTERAL | @ 13:00:00 | Stop: 2023-04-20

## 2023-04-20 MED ADMIN — HYDROmorphone (PF) (DILAUDID) injection 1 mg: 1 mg | INTRAVENOUS | @ 12:00:00 | Stop: 2023-04-22

## 2023-04-20 MED ADMIN — HYDROmorphone (PF) (DILAUDID) injection 1 mg: 1 mg | INTRAVENOUS | @ 18:00:00 | Stop: 2023-04-22

## 2023-04-20 MED ADMIN — promethazine (PHENERGAN) 12.5 mg in sodium chloride 0.9 % 50 mL IVPB (premix): 12.5 mg | INTRAVENOUS | @ 12:00:00

## 2023-04-20 MED ADMIN — HYDROmorphone (PF) (DILAUDID) injection 1 mg: 1 mg | INTRAVENOUS | Stop: 2023-04-22

## 2023-04-20 MED ADMIN — escitalopram oxalate (LEXAPRO) tablet 5 mg: 5 mg | ENTERAL | @ 13:00:00

## 2023-04-20 MED ADMIN — sodium chloride (NS) 0.9 % flush 10 mL: 10 mL | INTRAVENOUS | @ 02:00:00

## 2023-04-20 MED ADMIN — cefdinir (OMNICEF) oral suspension: 300 mg | ORAL | @ 13:00:00 | Stop: 2023-05-06

## 2023-04-20 MED ADMIN — famotidine (PEPCID) oral suspension: 20 mg | ENTERAL | @ 02:00:00

## 2023-04-20 MED ADMIN — cetirizine (ZYRTEC) oral syrup: 20 mg | ENTERAL | @ 13:00:00

## 2023-04-20 MED ADMIN — apixaban (ELIQUIS) tablet 5 mg: 5 mg | ORAL | @ 13:00:00

## 2023-04-20 MED ADMIN — acetaminophen (TYLENOL) oral liquid: 1000 mg | ENTERAL | @ 02:00:00

## 2023-04-20 MED ADMIN — acetaminophen (TYLENOL) oral liquid: 1000 mg | ENTERAL | @ 18:00:00

## 2023-04-20 MED ADMIN — HYDROmorphone (PF) (DILAUDID) injection 1 mg: 1 mg | INTRAVENOUS | @ 07:00:00 | Stop: 2023-04-22

## 2023-04-20 MED ADMIN — acetaminophen (TYLENOL) oral liquid: 1000 mg | ENTERAL | @ 12:00:00

## 2023-04-20 MED ADMIN — guar gum (NUTRISOURCE) 1 packet: 1 | ENTERAL | @ 02:00:00

## 2023-04-20 MED ADMIN — ondansetron (ZOFRAN) injection 8 mg: 8 mg | INTRAVENOUS | @ 16:00:00

## 2023-04-20 MED ADMIN — promethazine (PHENERGAN) 12.5 mg in sodium chloride 0.9 % 50 mL IVPB (premix): 12.5 mg | INTRAVENOUS | @ 05:00:00

## 2023-04-20 MED ADMIN — ammonium lactate (LAC-HYDRIN) 12 % lotion 1 Application: 1 | TOPICAL | @ 03:00:00

## 2023-04-20 MED ADMIN — promethazine (PHENERGAN) 12.5 mg in sodium chloride 0.9 % 50 mL IVPB (premix): 12.5 mg | INTRAVENOUS | @ 18:00:00

## 2023-04-20 MED ADMIN — sodium chloride (NS) 0.9 % flush 10 mL: 10 mL | INTRAVENOUS | @ 13:00:00

## 2023-04-20 MED ADMIN — ammonium lactate (LAC-HYDRIN) 12 % lotion 1 Application: 1 | TOPICAL | @ 13:00:00

## 2023-04-20 MED ADMIN — apixaban (ELIQUIS) tablet 5 mg: 5 mg | ORAL | @ 02:00:00

## 2023-04-20 MED ADMIN — sodium chloride (NS) 0.9 % flush 10 mL: 10 mL | INTRAVENOUS | @ 18:00:00

## 2023-04-20 MED ADMIN — baclofen (LIORESAL) tablet 5 mg: 5 mg | ORAL | @ 18:00:00

## 2023-04-20 MED ADMIN — oxyCODONE (ROXICODONE) 5 mg/5 mL solution 20 mg: 20 mg | ORAL | @ 21:00:00 | Stop: 2023-04-24

## 2023-04-20 MED ADMIN — psyllium (METAMUCIL) 3.4 gram packet 1 packet: 1 | ORAL | @ 16:00:00

## 2023-04-20 MED ADMIN — lidocaine (ASPERCREME) 4 % 1 patch: 1 | TRANSDERMAL | @ 13:00:00

## 2023-04-20 MED ADMIN — oxyCODONE (ROXICODONE) 5 mg/5 mL solution 20 mg: 20 mg | ORAL | @ 13:00:00 | Stop: 2023-04-24

## 2023-04-20 MED ADMIN — baclofen (LIORESAL) tablet 5 mg: 5 mg | ORAL | @ 13:00:00

## 2023-04-20 MED ADMIN — pantoprazole (Protonix) injection 40 mg: 40 mg | INTRAVENOUS | @ 15:00:00

## 2023-04-20 MED ADMIN — baclofen (LIORESAL) tablet 5 mg: 5 mg | ORAL | @ 02:00:00

## 2023-04-20 NOTE — Unmapped (Signed)
Physical Medicine and Rehabilitation  Daily Progress Note [facility]    ASSESSMENT:     [name] [name] is a 23 y.o. female 23 y.o. female with PMH of [name] s/p proctocolectomy and J-pouch, desmoid tumours, chronic abdominal pain and nausea, anxiety, PTSD related to past medical care, DVT RUE s/p Eliquis x3 months admitted for constellation of worsening pain, intolerance of oral intake. She is now admitted to [facility] for comprehensive interdisciplinary rehabilitation.      Rehab Impairment Group Code [facility]):   (Debility) 16 Debility (Non-Cardiac/Non-Pulmonary)   Etiology: worsening pain, intolerance of oral intake     PLAN:     This patient is admitted to the Physical Medicine and Rehabilitation - Inpatient - A service from 8am-5pm on weekdays for questions regarding this patient. After hours, weekends, and holidays please contact the 1st Call resident pager     REHAB:   - PT and OT to maximize functional status with mobility and ADLs as well as prevention of joint contracture.   - P&O for assistive devices PRN.  - To be discussed in weekly Interdisciplinary Team Conference.     Acute on chronic abdominal pain and nausea  Complex situation discussed at length in prior notes. In brief, her pain and nausea are (deeply) multifactorial but consensus among treatment team is that new / unknown organic issue is unlikely. Plan to focus on symptom control, adequate nutrition.  Patient is currently at goal tube feeding, tolerating well.  No visible emesis noted.  Still reports persistent nausea but on aggressive nausea management as below. Chronic pain service consulted, appreciated.  - NO CHANGES to pain regimen without multiparty discussion including patient, unless true emergency.  Seen by chronic pain 1/28, recommended to continue the pain regimen as below no changes made.  - Pain management:  -- oxycodone (liquid) 20 mg q4 prn-first-line for pain  -- changed hydromorphone 1 mg IV q6prn -second line for pain  (ONLY IF inadequate relief 1 hour after oral alternative)  -- buprenorphine 20 mcg/h patch   -- hyoscyamine prn  -- Baclofen 5mg  TID  - Nausea: discuss about transitioning one of the IV nausea medication to p.o.  -- ondansetron IV q8 PRN  -- promethazine IV q6   -- scopolamine patch  -- olanzapine 5 mg in evening  -- no diphenhydramine IV in absence of clear indication  - Dyspepsia:  -- famotidine bid  -- PPI IV q12  - GI motility:  -- naloxegol prn  -- okay to use loperamide sparingly     Left arm PICC line site swelling, pain and ecchymosis - DVT  Left arm PICC line site noted mild swelling and mild ecchymosis from insertion.  No visible swelling noted below the elbow.  PVL confirmed acute DVT, started on anticoagulation and removed PICC line on 1/28, given acute DVT also patient is currently not on any IV antibiotic. Removed PICC line 1/28,  swelling around prior picc line site improving, mild redness/ rash  due to tape, still has bruises but resolving.   - Eliquis 5mg  BID; will need 3 months of therapy  - Stopped lovenox     Inadequate oral intake  Abnormal gut anatomy notwithstanding, it is the consensus among hospitalist and GI services that oral and/or enteral feeding should be sufficient to meet caloric and hydration needs. Has not developed refeeding syndrome. TPN will not be offered in forseeable future.  - titrate tube feeds to ~100% USRDI = 65 mL/h: reached at 60cc/hr- tolerated well.   -  Per dietitian, patient requested for cyclic feeding , started on cyclic feeding with Nutren 2.0, at goal rate 50 cc/h for 16 hours with acute 4-hour water flushes 150 cc .   - Calorie count per nutrition  - follow BMP, Mg phos q48-72, stop once at goal feeds  - GI to follow and manage wean from enteral nutrition at discharge     Elevated transaminases  Improving: Labs with AST 21, ALT 70, downtrending from prior. No evidence of cholestasis or synthetic dysfunction. Presume drug-related. No longer following closely as trend is well established.     Pouchitis (dx 03/2023), resolving  No plan for repeat endoscopy or colonoscopy during this admission, per GI, given recent exam. No significant decrement in Hb so far  - Cefdinir 300 mg daily (end = 05/05/2023)  - Completed Cefdinir 300 mg bid for 4 weeks (end = 04/07/2023)  - CBC weekly     Desmoid tumors  - okay to hold nirogesat while having difficulty with oral meds, no urgency to resume per Dr [name]     Mood / anxiety  - continue escitalopram  - lorazepam 1 mg bid prn (home medication)     Daily Checklist  - Diet: Regular diet with cyclic tube feeds  - DVT PPX: eliquis  - GI PPX: IV protonix BID  - Access: piv    DME needs  - To be determined    DISPO: Patient to be discussed at weekly interdisciplinary team conference.   - EDD: 2/13  - Follow-up: PCP, PM&R, GI, Oncology    SUBJECTIVE:     Interval Events:   NAEO.  Patient has concerns regarding swelling of her belly.  She believes she has some fluid that is pushing on her tumors.  We will get a KUB to see what may be causing her discomfort.  We will also adjust her guar gum to Metamucil as this may be better tolerated.  In the meantime her labs look very normal today.    OBJECTIVE:     Vital signs (last 24 hours):  Temp:  [36.2 ??C (97.2 ??F)-36.6 ??C (97.9 ??F)] 36.6 ??C (97.9 ??F)  Heart Rate:  [78-80] 78  Resp:  [18] 18  BP: (114-141)/(67-70) 114/67  MAP (mmHg):  [82] 82  SpO2:  [98 %] 98 %    Intake/Output (last 3 shifts):  I/O last 3 completed shifts:  In: 10 [I.V.:10]  Out: -     Physical Exam:   GEN: NAD, sitting up in wheelchair at bedside  EYES: sclera anicteric, conjunctiva clear   HEENT: MMM, NG tube in place  NECK: trachea midline  CV: warm extremities, well perfused, no cyanosis   RESP:  NWOB on RA  ABD: Not distended, no pitting edema  SKIN: no visible masses, lesions, rashes, ecchymoses.   MSK: no notable contractures or ext swelling  NEURO:Alert, MAE, speech fluid and coherent, responds appropriately to questions   PSYCH: mood euthymic, affect appropriate, thought process logical         Medications:  Scheduled    acetaminophen (TYLENOL) oral liquid Q8H SCH    ammonium lactate (LAC-HYDRIN) 12 % lotion 1 Application BID    apixaban (ELIQUIS) tablet 5 mg BID    baclofen (LIORESAL) tablet 5 mg TID    buprenorphine 20 mcg/hour transdermal patch 1 patch Weekly    cefdinir (OMNICEF) oral suspension Daily    cetirizine (ZYRTEC) oral syrup BID    escitalopram oxalate (LEXAPRO) tablet 5 mg Daily  famotidine (PEPCID) oral suspension Nightly    fluticasone propionate (FLONASE) 50 mcg/actuation nasal spray 1 spray Weekly    lidocaine (ASPERCREME) 4 % 1 patch Daily    multivitamin with folic acid 400 mcg tablet 1 tablet Daily    OLANZapine zydis (ZYPREXA) disintegrating tablet 5 mg Q PM    pantoprazole (Protonix) injection 40 mg BID    promethazine (PHENERGAN) 12.5 mg in sodium chloride 0.9 % 50 mL IVPB (premix) Q6H    psyllium (METAMUCIL) 3.4 gram packet 1 packet TID    scopolamine (TRANSDERM-SCOP) 1 mg over 3 days topical patch 1 mg Q72H    sodium chloride (NS) 0.9 % flush 10 mL Q8H     PRN hydrocortisone, , BID PRN  hydrocortisone, , BID PRN  HYDROmorphone, 1 mg, Q6H PRN  hyoscyamine, 125 mcg, Q4H PRN  loperamide, 2 mg, QID PRN  LORazepam, 1 mg, BID PRN  melatonin, 3 mg, Nightly PRN  naloxegol, 12.5 mg, Daily PRN  ondansetron, 8 mg, Q8H PRN  oxyCODONE, 20 mg, Q4H PRN  zinc oxide-cod liver oil, , Daily PRN          Labs/Studies: Reviewed     Radiology Results:   No results found.        Quality Indicators      Hearing, Speech, and Vision  Ability to Hear: Adequate  Ability to See in Adequate Light: Adequate  Expression of Ideas and Wants: Without difficulty  Understanding Verbal and Non-Verbal Content: Understands    Cognitive Pattern Assessment  Cognitive Pattern Assessment Used: BIMS  Brief Interview for Mental Status (BIMS)  Repetition of Three Words (First Attempt): 3  Temporal Orientation: Year: Correct  Temporal Orientation: Month: Accurate within 5 days  Temporal Orientation: Day: Correct  Recall: Sock: Yes, no cue required  Recall: Blue: Yes, no cue required  Recall: Bed: Yes, no cue required  BIMS Summary Score: 15       Nutritional Approaches  Nutritional Approach: Feeding tube    ADLs  Admission Current   Eating Assistance Needed: Set-up / clean-up    CARE Score - 5 Assistance Needed: Set-up / clean-up    CARE Score - 5   Oral Hygiene  Assistance Needed: Physical assistance Total assistance  CARE Score - 1  Assistance Needed: Physical assistance    CARE Score - 1   Bladder Continence       Bowel Continence       Toileting Hygiene Assistance Needed: Incidental touching    CARE Score - 4 Assistance Needed: Incidental touching    CARE Score - 4    Toilet Transfer Assistance Needed: Physical assistance 25% or less  CARE Score - 3 Assistance Needed: Physical assistance    CARE Score - 3   Shower/Bathe Self      CARE Score - 88       CARE Score - 88   Upper Body Dressing Assistance Needed: Physical assistance 26%-50%  CARE Score - 3  Assistance Needed: Independent    CARE Score - 6   Lower Body Dressing Assistance Needed: Physical assistance 76% or more  CARE Score - 2  Assistance Needed: Physical assistance    CARE Score - 2   On/Off Footwear Assistance Needed: Physical assistance Total assistance  CARE Score - 1  Assistance Needed: Physical assistance    CARE Score - 1         Transfers Admission Current   Bed to Chair Assistance Needed: Incidental touching    CARE  Score - 4  Assistance Needed: Incidental touching    CARE Score - 4     Lying to Sitting Assistance Needed: Physical assistance 26%-50%  CARE Score - 3  Assistance Needed: Physical assistance    CARE Score - 3   Roll Left/Right Assistance Needed: Physical assistance 25% or less  CARE Score - 3  Assistance Needed: Physical assistance    CARE Score - 3   Sit to Lying Assistance Needed: Physical assistance 26%-50%  CARE Score - 3  Assistance Needed: Physical assistance    CARE Score - 3   Sit to Stand Assistance Needed: Incidental touching    CARE Score - 4  Assistance Needed: Incidental touching    CARE Score - 4         Mobility Admission Current   Walk 10 Feet Assistance Needed: Adaptive equipment, Physical assistance 25% or less  CARE Score - 3 Assistance Needed: Adaptive equipment, Physical assistance    CARE Score - 3     Walk 50 Feet 2 Turns Assistance Needed: Physical assistance 51%-75%  CARE Score - 2 Assistance Needed: Physical assistance    CARE Score - 2   Walk 150 Feet      CARE Score - 88       CARE Score - 88   Walk 10 Feet Uneven      CARE Score - 88       CARE Score - 88   1 Step (Curb)      CARE Score - 88       CARE Score - 88   4 Steps      CARE Score - 88       CARE Score - 88   12 Steps      CARE Score - 88       CARE Score - 88   Picking Up Object      CARE Score - 10       CARE Score - 10   Wheelchair/Scooter Use        Wheel 50 Feet 2 Turns Assistance Needed: Independent      CARE Score - Wheel 50 Feet with Two Turns: 6    Type of Wheelchair/Scooter: Manual Assistance Needed: Independent      CARE Score - Wheel 50 Feet with Two Turns: 6    Type of Wheelchair/Scooter: Manual   Wheel 150 Feet Assistance Needed: Physical assistance 26%-50%    CARE Score - Wheel 150 Feet: 3    Type of Wheelchair/Scooter: Manual  Assistance Needed: Physical assistance      CARE Score - Wheel 150 Feet: 3    Type of Wheelchair/Scooter: Manual

## 2023-04-20 NOTE — Unmapped (Signed)
[name] [name] has been contacted in regards to their refill of [place]. At this time, they have declined refill due to medication being on hold. Refill assessment call date has been updated per the patient's request.

## 2023-04-20 NOTE — Unmapped (Signed)
Prn oxycodone given for 7/10 pain, minimally effective. Prn Dilaudid given for persistent pain in lower back. Lidocaine patch applied to lower back for pain. Patient refused Zyprexa. Nutren 2.0 cyclic tube feed stopped at 5409, flushed. Cyclic tube feed restarted at 1600 at a rate of 75mL/hr. Medications administered through NG tube. IV flushing, patent. IV Zofran given once .     Problem: Rehabilitation (IRF) Plan of Care  Goal: Plan of Care Review  Outcome: Progressing  Goal: Patient-Specific Goal (Individualized)  Outcome: Progressing  Goal: Absence of New-Onset Illness or Injury  Outcome: Progressing  Intervention: Prevent Fall and Fall Injury  Recent Flowsheet Documentation  Taken 04/20/2023 1600 by Charlyne Quale, RN  Safety Interventions: fall reduction program maintained  Taken 04/20/2023 1200 by Charlyne Quale, RN  Safety Interventions: fall reduction program maintained  Taken 04/20/2023 0800 by Charlyne Quale, RN  Safety Interventions: fall reduction program maintained  Taken 04/20/2023 0705 by Charlyne Quale, RN  Safety Interventions: fall reduction program maintained  Intervention: Prevent Skin Injury  Recent Flowsheet Documentation  Taken 04/20/2023 1600 by Charlyne Quale, RN  Skin Protection: incontinence pads utilized  Taken 04/20/2023 1200 by Charlyne Quale, RN  Skin Protection: incontinence pads utilized  Goal: Optimal Comfort and Wellbeing  Outcome: Progressing  Goal: Home and Community Transition Plan Established  Outcome: Progressing  Goal: Rounds/Family Conference  Outcome: Progressing     Problem: Latex Allergy  Goal: Absence of Allergy Symptoms  Outcome: Progressing     Problem: Self-Care Deficit  Goal: Improved Ability to Complete Activities of Daily Living  Outcome: Progressing     Problem: Fall Injury Risk  Goal: Absence of Fall and Fall-Related Injury  Outcome: Progressing  Intervention: Promote Scientist, clinical (histocompatibility and immunogenetics) Documentation  Taken 04/20/2023 1600 by Charlyne Quale, RN  Safety Interventions: fall reduction program maintained  Taken 04/20/2023 1200 by Charlyne Quale, RN  Safety Interventions: fall reduction program maintained  Taken 04/20/2023 0800 by Charlyne Quale, RN  Safety Interventions: fall reduction program maintained  Taken 04/20/2023 0705 by Charlyne Quale, RN  Safety Interventions: fall reduction program maintained     Problem: Malnutrition  Goal: Improved Nutritional Intake  Outcome: Progressing

## 2023-04-20 NOTE — Unmapped (Signed)
Patient will be seen in the next day or 2. Another AFO was ordered for the patient for appropriate sizing.

## 2023-04-20 NOTE — Unmapped (Signed)
Slept soundly this shift. Enteral tube feeding in progress till 8am today. Denies nausea. Bowel sounds present in all quadrants.Head of bed greater than 30 degrees. IV medications for pain administered as requested. Mother with patient overnight and assisted with care needs. Will continue to monitor as care for as planned.  Problem: Rehabilitation (IRF) Plan of Care  Goal: Plan of Care Review  Outcome: Progressing  Goal: Patient-Specific Goal (Individualized)  Outcome: Progressing  Goal: Absence of New-Onset Illness or Injury  Outcome: Progressing  Intervention: Prevent Fall and Fall Injury  Recent Flowsheet Documentation  Taken 04/20/2023 0400 by Trudie Buckler, RN  Safety Interventions: fall reduction program maintained  Taken 04/20/2023 0200 by Trudie Buckler, RN  Safety Interventions: fall reduction program maintained  Taken 04/20/2023 0000 by Trudie Buckler, RN  Safety Interventions:   fall reduction program maintained   family at bedside  Taken 04/19/2023 2200 by Trudie Buckler, RN  Safety Interventions:   fall reduction program maintained   low bed   family at bedside  Taken 04/19/2023 2000 by Trudie Buckler, RN  Safety Interventions:   fall reduction program maintained   low bed  Goal: Optimal Comfort and Wellbeing  Outcome: Progressing  Goal: Home and Community Transition Plan Established  Outcome: Progressing  Goal: Rounds/Family Conference  Outcome: Progressing

## 2023-04-21 DIAGNOSIS — D48119 Desmoid tumor: Principal | ICD-10-CM

## 2023-04-21 MED ADMIN — baclofen (LIORESAL) tablet 5 mg: 5 mg | ORAL | @ 03:00:00

## 2023-04-21 MED ADMIN — acetaminophen (TYLENOL) oral liquid: 1000 mg | ENTERAL | @ 03:00:00

## 2023-04-21 MED ADMIN — ondansetron (ZOFRAN) injection 8 mg: 8 mg | INTRAVENOUS | @ 21:00:00

## 2023-04-21 MED ADMIN — promethazine (PHENERGAN) 12.5 mg in sodium chloride 0.9 % 50 mL IVPB (premix): 12.5 mg | INTRAVENOUS

## 2023-04-21 MED ADMIN — ondansetron (ZOFRAN) injection 8 mg: 8 mg | INTRAVENOUS | @ 03:00:00

## 2023-04-21 MED ADMIN — pantoprazole (Protonix) injection 40 mg: 40 mg | INTRAVENOUS | @ 13:00:00

## 2023-04-21 MED ADMIN — psyllium (METAMUCIL) 3.4 gram packet 1 packet: 1 | ORAL | @ 13:00:00

## 2023-04-21 MED ADMIN — psyllium (METAMUCIL) 3.4 gram packet 1 packet: 1 | ORAL | @ 20:00:00

## 2023-04-21 MED ADMIN — escitalopram oxalate (LEXAPRO) tablet 5 mg: 5 mg | ENTERAL | @ 13:00:00

## 2023-04-21 MED ADMIN — pantoprazole (Protonix) injection 40 mg: 40 mg | INTRAVENOUS | @ 03:00:00

## 2023-04-21 MED ADMIN — acetaminophen (TYLENOL) oral liquid: 1000 mg | ENTERAL | @ 11:00:00

## 2023-04-21 MED ADMIN — oxyCODONE (ROXICODONE) 5 mg/5 mL solution 20 mg: 20 mg | ORAL | @ 20:00:00 | Stop: 2023-04-25

## 2023-04-21 MED ADMIN — promethazine (PHENERGAN) 12.5 mg in sodium chloride 0.9 % 50 mL IVPB (premix): 12.5 mg | INTRAVENOUS | @ 11:00:00

## 2023-04-21 MED ADMIN — sodium chloride (NS) 0.9 % flush 10 mL: 10 mL | INTRAVENOUS | @ 18:00:00

## 2023-04-21 MED ADMIN — cetirizine (ZYRTEC) oral syrup: 20 mg | ENTERAL | @ 03:00:00

## 2023-04-21 MED ADMIN — simethicone (MYLICON) chewable tablet 80 mg: 80 mg | ORAL | @ 17:00:00

## 2023-04-21 MED ADMIN — famotidine (PEPCID) oral suspension: 20 mg | ENTERAL | @ 03:00:00

## 2023-04-21 MED ADMIN — oxyCODONE (ROXICODONE) 5 mg/5 mL solution 20 mg: 20 mg | ORAL | @ 13:00:00 | Stop: 2023-04-25

## 2023-04-21 MED ADMIN — sodium chloride (NS) 0.9 % flush 10 mL: 10 mL | INTRAVENOUS | @ 11:00:00

## 2023-04-21 MED ADMIN — psyllium (METAMUCIL) 3.4 gram packet 1 packet: 1 | ORAL | @ 03:00:00

## 2023-04-21 MED ADMIN — acetaminophen (TYLENOL) oral liquid: 1000 mg | ENTERAL | @ 20:00:00

## 2023-04-21 MED ADMIN — lidocaine (ASPERCREME) 4 % 1 patch: 1 | TRANSDERMAL | @ 13:00:00

## 2023-04-21 MED ADMIN — apixaban (ELIQUIS) tablet 5 mg: 5 mg | ORAL | @ 03:00:00

## 2023-04-21 MED ADMIN — HYDROmorphone (PF) (DILAUDID) injection 1 mg: 1 mg | INTRAVENOUS | @ 06:00:00 | Stop: 2023-04-25

## 2023-04-21 MED ADMIN — oxyCODONE (ROXICODONE) 5 mg/5 mL solution 20 mg: 20 mg | ORAL | @ 03:00:00 | Stop: 2023-04-24

## 2023-04-21 MED ADMIN — pancrelipase (Lip-Prot-Amyl) (CREON) 24,000-76,000 -120,000 unit delayed release capsule 24,000 units of lipase: 1 | ORAL | @ 16:00:00 | Stop: 2023-04-21

## 2023-04-21 MED ADMIN — ammonium lactate (LAC-HYDRIN) 12 % lotion 1 Application: 1 | TOPICAL | @ 13:00:00

## 2023-04-21 MED ADMIN — HYDROmorphone (PF) (DILAUDID) injection 1 mg: 1 mg | INTRAVENOUS | @ 11:00:00 | Stop: 2023-04-25

## 2023-04-21 MED ADMIN — apixaban (ELIQUIS) tablet 5 mg: 5 mg | ORAL | @ 13:00:00

## 2023-04-21 MED ADMIN — sodium bicarbonate tablet 648 mg: 648 mg | ORAL | @ 16:00:00 | Stop: 2023-04-21

## 2023-04-21 MED ADMIN — ammonium lactate (LAC-HYDRIN) 12 % lotion 1 Application: 1 | TOPICAL | @ 03:00:00

## 2023-04-21 MED ADMIN — cefdinir (OMNICEF) oral suspension: 300 mg | ORAL | @ 13:00:00 | Stop: 2023-05-06

## 2023-04-21 MED ADMIN — promethazine (PHENERGAN) 12.5 mg in sodium chloride 0.9 % 50 mL IVPB (premix): 12.5 mg | INTRAVENOUS | @ 17:00:00

## 2023-04-21 MED ADMIN — HYDROmorphone (PF) (DILAUDID) injection 1 mg: 1 mg | INTRAVENOUS | Stop: 2023-04-25

## 2023-04-21 MED ADMIN — HYDROmorphone (PF) (DILAUDID) injection 1 mg: 1 mg | INTRAVENOUS | @ 17:00:00 | Stop: 2023-04-25

## 2023-04-21 MED ADMIN — sodium chloride (NS) 0.9 % flush 10 mL: 10 mL | INTRAVENOUS | @ 03:00:00

## 2023-04-21 MED ADMIN — promethazine (PHENERGAN) 12.5 mg in sodium chloride 0.9 % 50 mL IVPB (premix): 12.5 mg | INTRAVENOUS | @ 06:00:00

## 2023-04-21 MED ADMIN — simethicone (MYLICON) chewable tablet 80 mg: 80 mg | ORAL

## 2023-04-21 NOTE — Unmapped (Signed)
Prosthetics and Orthotics Inpatient Encounter    Consult: B small ToeOff AFOs    Requesting Service: PMR/Therapy      Purpose of Consult/encounter     [name] [name] was seen while inpatient for fitting and delivery of R small ToeOff AFO.      Pertinent History     Hx of decreased strength, decreased endurance and mobility. Reports using a ToeOff with PT but did not have the small size for the other side so they had to wear a different type.       Actions taken today     [name] [name] was seen for fitting and delivery of R small ToeOff AFO. Padding and straps assembled prior to visit. Device was fit into patient's R OC sneaker and donned while patient in bed. AFO appeared to fit well to patient contours. Reviewed that P&O is still waiting for L small ToeOff (2.0) AFO to arrive and will return for delivery once available. Patient confirmed comfort with R AFO. Reviewed wear and care.     Device Data    Base Device  Quantity: 1  Side: Right  Size: Small  Description: ToeOff 2.0 AFO  Part Number: [id number]  Serial Number: [id number]  Supplier: Other  Base Device Supplier: [employer]  Warranty: 90 Days  Action: Delivered    Device checked for safety and security during the visit today.          Plan    Follow up actions: Delivery of L small ToeOff 2.0    Return Visit:  2/12-2/13    Wendall Stade, [facility]    *For brace related issues please secure chat (Prosthetics and Orthotics Clinical Team) or page Prosthetics & Orthotics @ [phone]  *For urgent needs during after-hours or weekends, please page the on-call Prosthetics & Orthotics provider.    Custom Fit Carbon Fiber Ground Reaction AFO    Purpose/Function  An Ankle Foot Orthosis (AFO) is a common brace meant to support the foot and ankle experiencing drop foot.  It is light weight and durable because of its carbon fiber materials.  The design makes it very easy to fit into most footwear and the strap(s) near the calf makes it very convenient to put on.     Putting device on / taking it off  The AFO should be used with a comfortable sock.  Because the cuff is fabric - the sock just needs to rise above the shoe but longer socks are acceptable.     To don the brace (put it on)  Recommend leaving the AFO inserted in the shoe covered with an insole (the insole which came with the shoe or a store bought insole is fine.    Loosen the laces or Velcro  on the shoe, Free the velcro straps so that they dangle loosely.  Shoes which do not open such as loafers or slip-ons are not reccommended for use with an AFO.  Slide your foot into the shoe.    Position the AFO cuff onto your shin.   Fasten the Velcro or laces on your shoe.    Secure the AFO cuff with the Velcro strap(s)  Pull pant leg over AFO  To remove brace:  Remove Velcro fasteners  Loosen the shoe laces or velcro  Pull gently off foot from bottom  Store brace in clean, dry location with the Velcro fastened to avoid Velcro getting caught on other clothes or accumulating debris.  The brace should always be used with well-fitted  sneakers or other footwear.  Always use an insole over the AFO    Wearing your device  The AFO is an active brace - meaning that it is meant to be worn during physical activity.  Your physician may have specific instructions for you.  You should break-in your AFO gradually:   We recommend the following schedule:     Day 1: Wear for 1 hour, off for 1 hour.  Repeat as time allows    Day 2:  Wear for 2 hours, off for 1 hour.  Repeat as time allows    Day 3: Wear for 3 hours, off for 1 hour.  Repeat as time allows    Day 4: Wear for 4 hours, off for 1 hour.  Repeat as time allows    Day 5 and beyond:   Wear full day  Your AFO is meant to help prevent trips and falls.  You should develop a routine to wearing your AFO so that you are not caught without it.  The AFO is not meant to be worn to bed.      Cleaning / Care / Infection Control  The AFO can be washed in with soap and water.  Use a mild cleaning detergent. Do not use bleach.  Do not place in washer or dryer - set out to air dry.    The brace is meant to be used on the person for whom it was prescribed.  It should not be shared with other people or on other body parts.      Risks  The most notable risk to you of wearing this brace is skin irritation.  There is a risk of over-use causing skin irritation or blistering.  Be sure to wear a comfortable sock.  Like with new shoes, it may be necessary to break-in the brace by wearing it for short periods of time and checking your skin for any signs of irritation.    While some redness is expected when wearing a brace tightly, it should go away within 20-30 minutes.  If you experience any blisters or signs of rash discontinue wearing and contact your provider.    Does not contain latex   Courtesy of [employer] [place]  How to contact  If you experience any discomfort or have questions about the use of this orthosis you may contact the DMEPOS team by using the inbasket feature of your mychart account.  You may also contact your provider in the same way.  If you would like to reach out by phone, contact [phone]

## 2023-04-21 NOTE — Unmapped (Signed)
Patient complain of abdomen and back pain managed with current pain regimen with relief.Tube feeding continuous till 0800.Meds given thru G-tube.mother at bedside.Callbell within reach.Will continue to monitor.  Problem: Rehabilitation (IRF) Plan of Care  Goal: Absence of New-Onset Illness or Injury  Intervention: Prevent Fall and Fall Injury  Recent Flowsheet Documentation  Taken 04/21/2023 0200 by Barbaraann Cao, RN  Safety Interventions: fall reduction program maintained  Taken 04/21/2023 0000 by Barbaraann Cao, RN  Safety Interventions: fall reduction program maintained  Taken 04/20/2023 2200 by Barbaraann Cao, RN  Safety Interventions: fall reduction program maintained  Taken 04/20/2023 2000 by Barbaraann Cao, RN  Safety Interventions: fall reduction program maintained

## 2023-04-21 NOTE — Unmapped (Signed)
Physical Medicine and Rehabilitation  Daily Progress Note [facility]    ASSESSMENT:     Ceriah Leonie [name] is a 23 y.o. female 23 y.o. female with PMH of [name] s/p proctocolectomy and J-pouch, desmoid tumours, chronic abdominal pain and nausea, anxiety, PTSD related to past medical care, DVT RUE s/p Eliquis x3 months admitted for constellation of worsening pain, intolerance of oral intake. She is now admitted to [facility] for comprehensive interdisciplinary rehabilitation.      Rehab Impairment Group Code [facility]):   (Debility) 16 Debility (Non-Cardiac/Non-Pulmonary)   Etiology: worsening pain, intolerance of oral intake     PLAN:     This patient is admitted to the Physical Medicine and Rehabilitation - Inpatient - A service from 8am-5pm on weekdays for questions regarding this patient. After hours, weekends, and holidays please contact the 1st Call resident pager     REHAB:   - PT and OT to maximize functional status with mobility and ADLs as well as prevention of joint contracture.   - P&O for assistive devices PRN.  - To be discussed in weekly Interdisciplinary Team Conference.     Acute on chronic abdominal pain and nausea  Complex situation discussed at length in prior notes. In brief, her pain and nausea are (deeply) multifactorial but consensus among treatment team is that new / unknown organic issue is unlikely. Plan to focus on symptom control, adequate nutrition.  Patient is currently at goal tube feeding, tolerating well.  No visible emesis noted.  Still reports persistent nausea but on aggressive nausea management as below. Chronic pain service consulted, appreciated.  - NO CHANGES to pain regimen without multiparty discussion including patient, unless true emergency.  Seen by chronic pain 1/28, recommended to continue the pain regimen as below no changes made.  - Pain management:  -- oxycodone (liquid) 20 mg q4 prn-first-line for pain  -- changed hydromorphone 1 mg IV q6prn -second line for pain  (ONLY IF inadequate relief 1 hour after oral alternative)  -- buprenorphine 20 mcg/h patch   -- hyoscyamine prn  -- Baclofen 5mg  TID  - Nausea: discuss about transitioning one of the IV nausea medication to p.o.  -- ondansetron IV q8 PRN  -- promethazine IV q6   -- scopolamine patch  -- olanzapine 5 mg in evening  -- no diphenhydramine IV in absence of clear indication  - Dyspepsia:  -- famotidine bid  -- PPI IV q12  - GI motility:  -- naloxegol prn  -- Simethicone 3 times daily  -- okay to use loperamide sparingly     Left arm PICC line site swelling, pain and ecchymosis - DVT  Left arm PICC line site noted mild swelling and mild ecchymosis from insertion.  No visible swelling noted below the elbow.  PVL confirmed acute DVT, started on anticoagulation and removed PICC line on 1/28, given acute DVT also patient is currently not on any IV antibiotic. Removed PICC line 1/28,  swelling around prior picc line site improving, mild redness/ rash  due to tape, still has bruises but resolving.   - Eliquis 5mg  BID; will need 3 months of therapy  - Stopped lovenox     Inadequate oral intake  Abnormal gut anatomy notwithstanding, it is the consensus among hospitalist and GI services that oral and/or enteral feeding should be sufficient to meet caloric and hydration needs. Has not developed refeeding syndrome. TPN will not be offered in forseeable future.  - titrate tube feeds to ~100% USRDI = 65  mL/h: reached at 60cc/hr- tolerated well.   - Per dietitian, patient requested for cyclic feeding , started on cyclic feeding with Nutren 2.0, at goal rate 50 cc/h for 16 hours with acute 4-hour water flushes 150 cc .   - Calorie count per nutrition  - follow BMP, Mg phos q48-72, stop once at goal feeds  - GI to follow and manage wean from enteral nutrition at discharge     Elevated transaminases  Improving: Labs with AST 21, ALT 70, downtrending from prior. No evidence of cholestasis or synthetic dysfunction. Presume drug-related. No longer following closely as trend is well established.     Pouchitis (dx 03/2023), resolving  No plan for repeat endoscopy or colonoscopy during this admission, per GI, given recent exam. No significant decrement in Hb so far  - Cefdinir 300 mg daily (end = 05/05/2023)  - Completed Cefdinir 300 mg bid for 4 weeks (end = 04/07/2023)  - CBC weekly     Desmoid tumors  - okay to hold nirogesat while having difficulty with oral meds, no urgency to resume per Dr [name]     Mood / anxiety  - continue escitalopram  - lorazepam 1 mg bid prn (home medication)     Daily Checklist  - Diet: Regular diet with cyclic tube feeds  - DVT PPX: eliquis  - GI PPX: IV protonix BID  - Access: piv    DME needs  - To be determined    DISPO: Patient to be discussed at weekly interdisciplinary team conference.   - EDD: 2/19  - Follow-up: PCP, PM&R, GI, Oncology    SUBJECTIVE:     Interval Events:   NAEO.  KUB yesterday demonstrated gas.  We discussed today scheduling simethicone for her.  She was agreeable with this.  We also discussed making sure she takes her baclofen as she seems to be improving with the switch from guar gum to Metamucil.  She was agreeable with this as well.  In team conference we discussed extending her to 2/19    OBJECTIVE:     Vital signs (last 24 hours):  Temp:  [36.6 ??C (97.9 ??F)] 36.6 ??C (97.9 ??F)  Heart Rate:  [75] 75  Resp:  [18] 18  BP: (116)/(58) 116/58  MAP (mmHg):  [74] 74  SpO2:  [97 %] 97 %    Intake/Output (last 3 shifts):  I/O last 3 completed shifts:  In: 230 [I.V.:20; NG/GT:210]  Out: -     Physical Exam:   GEN: NAD, working with therapies in the gym  EYES: sclera anicteric, conjunctiva clear   HEENT: MMM, NG tube in place  NECK: trachea midline  CV: warm extremities, well perfused, no cyanosis   RESP:  NWOB on RA  ABD: Not distended, no pitting edema  SKIN: no visible masses, lesions, rashes, ecchymoses.   MSK: no notable contractures or ext swelling  NEURO:Alert, MAE, speech fluid and coherent, responds appropriately to questions   PSYCH: mood euthymic, affect appropriate, thought process logical         Medications:  Scheduled    acetaminophen (TYLENOL) oral liquid Q8H SCH    ammonium lactate (LAC-HYDRIN) 12 % lotion 1 Application BID    apixaban (ELIQUIS) tablet 5 mg BID    baclofen (LIORESAL) tablet 5 mg TID    buprenorphine 20 mcg/hour transdermal patch 1 patch Weekly    cefdinir (OMNICEF) oral suspension Daily    cetirizine (ZYRTEC) oral syrup BID    escitalopram oxalate (LEXAPRO) tablet  5 mg Daily    famotidine (PEPCID) oral suspension Nightly    fluticasone propionate (FLONASE) 50 mcg/actuation nasal spray 1 spray Weekly    lidocaine (ASPERCREME) 4 % 1 patch Daily    multivitamin with folic acid 400 mcg tablet 1 tablet Daily    OLANZapine zydis (ZYPREXA) disintegrating tablet 5 mg Q PM    pancrelipase (Lip-Prot-Amyl) (CREON) 24,000-76,000 -120,000 unit delayed release capsule 24,000 units of lipase Once    pantoprazole (Protonix) injection 40 mg BID    promethazine (PHENERGAN) 12.5 mg in sodium chloride 0.9 % 50 mL IVPB (premix) Q6H    psyllium (METAMUCIL) 3.4 gram packet 1 packet TID    scopolamine (TRANSDERM-SCOP) 1 mg over 3 days topical patch 1 mg Q72H    simethicone (MYLICON) chewable tablet 80 mg TID    sodium bicarbonate tablet 648 mg Once    sodium chloride (NS) 0.9 % flush 10 mL Q8H     PRN hydrocortisone, , BID PRN  hydrocortisone, , BID PRN  HYDROmorphone, 1 mg, Q6H PRN  hyoscyamine, 125 mcg, Q4H PRN  loperamide, 2 mg, QID PRN  LORazepam, 1 mg, BID PRN  melatonin, 3 mg, Nightly PRN  naloxegol, 12.5 mg, Daily PRN  ondansetron, 8 mg, Q8H PRN  oxyCODONE, 20 mg, Q4H PRN  zinc oxide-cod liver oil, , Daily PRN          Labs/Studies: Reviewed     Radiology Results:   No results found.        Quality Indicators      Hearing, Speech, and Vision  Ability to Hear: Adequate  Ability to See in Adequate Light: Adequate  Expression of Ideas and Wants: Without difficulty  Understanding Verbal and Non-Verbal Content: Understands    Cognitive Pattern Assessment  Cognitive Pattern Assessment Used: BIMS  Brief Interview for Mental Status (BIMS)  Repetition of Three Words (First Attempt): 3  Temporal Orientation: Year: Correct  Temporal Orientation: Month: Accurate within 5 days  Temporal Orientation: Day: Correct  Recall: Sock: Yes, no cue required  Recall: Blue: Yes, no cue required  Recall: Bed: Yes, no cue required  BIMS Summary Score: 15       Nutritional Approaches  Nutritional Approach: Feeding tube    ADLs  Admission Current   Eating Assistance Needed: Set-up / clean-up    CARE Score - 5 Assistance Needed: Set-up / clean-up    CARE Score - 5   Oral Hygiene  Assistance Needed: Physical assistance Total assistance  CARE Score - 1  Assistance Needed: Physical assistance    CARE Score - 1   Bladder Continence       Bowel Continence       Toileting Hygiene Assistance Needed: Incidental touching    CARE Score - 4 Assistance Needed: Incidental touching    CARE Score - 4    Toilet Transfer Assistance Needed: Physical assistance 25% or less  CARE Score - 3 Assistance Needed: Physical assistance    CARE Score - 3   Shower/Bathe Self      CARE Score - 88       CARE Score - 88   Upper Body Dressing Assistance Needed: Physical assistance 26%-50%  CARE Score - 3  Assistance Needed: Independent    CARE Score - 6   Lower Body Dressing Assistance Needed: Physical assistance 76% or more  CARE Score - 2  Assistance Needed: Incidental touching    CARE Score - 4   On/Off Footwear Assistance Needed: Physical assistance Total  assistance  CARE Score - 1  Assistance Needed: Independent    CARE Score - 6         Transfers Admission Current   Bed to Chair Assistance Needed: Incidental touching    CARE Score - 4  Assistance Needed: Adaptive equipment, Independent    CARE Score - 6     Lying to Sitting Assistance Needed: Physical assistance 26%-50%  CARE Score - 3  Assistance Needed: Adaptive equipment, Independent    CARE Score - 6   Roll Left/Right Assistance Needed: Physical assistance 25% or less  CARE Score - 3  Assistance Needed: Independent, Adaptive equipment    CARE Score - 6   Sit to Lying Assistance Needed: Physical assistance 26%-50%  CARE Score - 3  Assistance Needed: Independent, Adaptive equipment    CARE Score - 6   Sit to Stand Assistance Needed: Incidental touching    CARE Score - 4  Assistance Needed: Set-up / clean-up, Adaptive equipment    CARE Score - 5         Mobility Admission Current   Walk 10 Feet Assistance Needed: Adaptive equipment, Physical assistance 25% or less  CARE Score - 3 Assistance Needed: Incidental touching, Adaptive equipment    CARE Score - 4     Walk 50 Feet 2 Turns Assistance Needed: Physical assistance 51%-75%  CARE Score - 2 Assistance Needed: Physical assistance    CARE Score - 88   Walk 150 Feet      CARE Score - 88       CARE Score - 88   Walk 10 Feet Uneven      CARE Score - 88       CARE Score - 88   1 Step (Curb) Assistance Needed: Physical assistance, Adaptive equipment 25% or less  CARE Score - 88  Assistance Needed: Physical assistance, Adaptive equipment 25% or less  CARE Score - 3   4 Steps      CARE Score - 88       CARE Score - 88   12 Steps      CARE Score - 88       CARE Score - 88   Picking Up Object      CARE Score - 10       CARE Score - 88   Wheelchair/Scooter Use        Wheel 50 Feet 2 Turns Assistance Needed: Independent      CARE Score - Wheel 50 Feet with Two Turns: 6    Type of Wheelchair/Scooter: Manual Assistance Needed: Independent      CARE Score - Wheel 50 Feet with Two Turns: 6    Type of Wheelchair/Scooter: Manual   Wheel 150 Feet Assistance Needed: Physical assistance 26%-50%    CARE Score - Wheel 150 Feet: 3    Type of Wheelchair/Scooter: Manual  Assistance Needed: Independent      CARE Score - Wheel 150 Feet: 6    Type of Wheelchair/Scooter: Manual

## 2023-04-21 NOTE — Unmapped (Signed)
Tube feed stopped at 0800, flushed. Medications crushed and administered through NG tube. Resistance noted when flushing NG tube. Provider notified. De-clogging kit mixture left to dwell in tube for 30 minutes. Contents removed, tube flushed with warm water. De-clogging kit effective. IV Zofran given for nausea before restart of tube feed at 1600, feed rate at 46mL/hr. Prn Oxycodone used for pain, partially effective. IV Dilaudid given for unrelieved pain.     Problem: Rehabilitation (IRF) Plan of Care  Goal: Plan of Care Review  Outcome: Progressing  Goal: Patient-Specific Goal (Individualized)  Outcome: Progressing  Goal: Absence of New-Onset Illness or Injury  Outcome: Progressing  Intervention: Prevent Fall and Fall Injury  Recent Flowsheet Documentation  Taken 04/21/2023 1200 by Charlyne Quale, RN  Safety Interventions: fall reduction program maintained  Taken 04/21/2023 0800 by Charlyne Quale, RN  Safety Interventions: fall reduction program maintained  Taken 04/21/2023 0705 by Charlyne Quale, RN  Safety Interventions: fall reduction program maintained  Intervention: Prevent Skin Injury  Recent Flowsheet Documentation  Taken 04/21/2023 1200 by Charlyne Quale, RN  Skin Protection: incontinence pads utilized  Taken 04/21/2023 0800 by Charlyne Quale, RN  Skin Protection: incontinence pads utilized  Taken 04/21/2023 0705 by Charlyne Quale, RN  Skin Protection: incontinence pads utilized  Goal: Optimal Comfort and Wellbeing  Outcome: Progressing  Goal: Home and Community Transition Plan Established  Outcome: Progressing  Goal: Rounds/Family Conference  Outcome: Progressing     Problem: Latex Allergy  Goal: Absence of Allergy Symptoms  Outcome: Progressing     Problem: Self-Care Deficit  Goal: Improved Ability to Complete Activities of Daily Living  Outcome: Progressing     Problem: Fall Injury Risk  Goal: Absence of Fall and Fall-Related Injury  Outcome: Progressing  Intervention: Promote Scientist, clinical (histocompatibility and immunogenetics) Documentation  Taken 04/21/2023 1200 by Charlyne Quale, RN  Safety Interventions: fall reduction program maintained  Taken 04/21/2023 0800 by Charlyne Quale, RN  Safety Interventions: fall reduction program maintained  Taken 04/21/2023 0705 by Charlyne Quale, RN  Safety Interventions: fall reduction program maintained     Problem: Malnutrition  Goal: Improved Nutritional Intake  Outcome: Progressing

## 2023-04-22 LAB — BASIC METABOLIC PANEL
ANION GAP: 11 mmol/L (ref 5–14)
BLOOD UREA NITROGEN: 15 mg/dL (ref 9–23)
BUN / CREAT RATIO: 33
CALCIUM: 9.8 mg/dL (ref 8.7–10.4)
CHLORIDE: 102 mmol/L (ref 98–107)
CO2: 29.4 mmol/L (ref 20.0–31.0)
CREATININE: 0.45 mg/dL — ABNORMAL LOW (ref 0.55–1.02)
EGFR CKD-EPI (2021) FEMALE: >90 mL/min/{1.73_m2} (ref >=60)
GLUCOSE RANDOM: 102 mg/dL (ref 70–179)
POTASSIUM: 4.1 mmol/L (ref 3.5–5.1)
SODIUM: 142 mmol/L (ref 135–145)

## 2023-04-22 LAB — MAGNESIUM: MAGNESIUM: 1.8 mg/dL (ref 1.6–2.6)

## 2023-04-22 LAB — CBC
HEMATOCRIT: 35.4 % (ref 34.0–44.0)
HEMOGLOBIN: 11.6 g/dL (ref 11.3–14.9)
MEAN CORPUSCULAR HEMOGLOBIN CONC: 32.9 g/dL (ref 32.0–36.0)
MEAN CORPUSCULAR HEMOGLOBIN: 28 pg (ref 25.9–32.4)
MEAN CORPUSCULAR VOLUME: 85.1 fL (ref 77.6–95.7)
MEAN PLATELET VOLUME: 9.9 fL (ref 6.8–10.7)
PLATELET COUNT: 243 10*9/L (ref 150–450)
RED BLOOD CELL COUNT: 4.16 10*12/L (ref 3.95–5.13)
RED CELL DISTRIBUTION WIDTH: 17.1 % — ABNORMAL HIGH (ref 12.2–15.2)
WBC ADJUSTED: 9.2 10*9/L (ref 3.6–11.2)

## 2023-04-22 LAB — PHOSPHORUS: PHOSPHORUS: 5.1 mg/dL (ref 2.4–5.1)

## 2023-04-22 MED ADMIN — famotidine (PEPCID) oral suspension: 20 mg | ENTERAL | @ 03:00:00

## 2023-04-22 MED ADMIN — buprenorphine 20 mcg/hour transdermal patch 1 patch: 1 | TRANSDERMAL | @ 14:00:00

## 2023-04-22 MED ADMIN — ammonium lactate (LAC-HYDRIN) 12 % lotion 1 Application: 1 | TOPICAL | @ 14:00:00

## 2023-04-22 MED ADMIN — pantoprazole (Protonix) injection 40 mg: 40 mg | INTRAVENOUS | @ 14:00:00

## 2023-04-22 MED ADMIN — ondansetron (ZOFRAN) injection 8 mg: 8 mg | INTRAVENOUS | @ 20:00:00

## 2023-04-22 MED ADMIN — apixaban (ELIQUIS) tablet 5 mg: 5 mg | ORAL | @ 14:00:00

## 2023-04-22 MED ADMIN — promethazine (PHENERGAN) 12.5 mg in sodium chloride 0.9 % 50 mL IVPB (premix): 12.5 mg | INTRAVENOUS | @ 17:00:00

## 2023-04-22 MED ADMIN — HYDROmorphone (PF) (DILAUDID) injection 1 mg: 1 mg | INTRAVENOUS | @ 18:00:00 | Stop: 2023-04-25

## 2023-04-22 MED ADMIN — cefdinir (OMNICEF) oral suspension: 300 mg | ORAL | @ 14:00:00 | Stop: 2023-05-06

## 2023-04-22 MED ADMIN — apixaban (ELIQUIS) tablet 5 mg: 5 mg | ORAL | @ 03:00:00

## 2023-04-22 MED ADMIN — simethicone (MYLICON) chewable tablet 80 mg: 80 mg | ORAL | @ 17:00:00

## 2023-04-22 MED ADMIN — HYDROmorphone (PF) (DILAUDID) injection 1 mg: 1 mg | INTRAVENOUS | @ 12:00:00 | Stop: 2023-04-25

## 2023-04-22 MED ADMIN — lidocaine (ASPERCREME) 4 % 1 patch: 1 | TRANSDERMAL | @ 14:00:00

## 2023-04-22 MED ADMIN — promethazine (PHENERGAN) 12.5 mg in sodium chloride 0.9 % 50 mL IVPB (premix): 12.5 mg | INTRAVENOUS | @ 11:00:00

## 2023-04-22 MED ADMIN — acetaminophen (TYLENOL) oral liquid: 1000 mg | ENTERAL | @ 11:00:00

## 2023-04-22 MED ADMIN — simethicone (MYLICON) chewable tablet 80 mg: 80 mg | ORAL | @ 11:00:00

## 2023-04-22 MED ADMIN — simethicone (MYLICON) chewable tablet 80 mg: 80 mg | ORAL | @ 23:00:00

## 2023-04-22 MED ADMIN — oxyCODONE (ROXICODONE) 5 mg/5 mL solution 20 mg: 20 mg | ORAL | @ 03:00:00 | Stop: 2023-04-25

## 2023-04-22 MED ADMIN — ammonium lactate (LAC-HYDRIN) 12 % lotion 1 Application: 1 | TOPICAL | @ 03:00:00

## 2023-04-22 MED ADMIN — promethazine (PHENERGAN) 12.5 mg in sodium chloride 0.9 % 50 mL IVPB (premix): 12.5 mg | INTRAVENOUS

## 2023-04-22 MED ADMIN — fluticasone propionate (FLONASE) 50 mcg/actuation nasal spray 1 spray: 1 | TOPICAL | @ 14:00:00

## 2023-04-22 MED ADMIN — HYDROmorphone (PF) (DILAUDID) injection 1 mg: 1 mg | INTRAVENOUS | Stop: 2023-04-25

## 2023-04-22 MED ADMIN — oxyCODONE (ROXICODONE) 5 mg/5 mL solution 20 mg: 20 mg | ORAL | @ 08:00:00 | Stop: 2023-04-25

## 2023-04-22 MED ADMIN — oxyCODONE (ROXICODONE) 5 mg/5 mL solution 20 mg: 20 mg | ORAL | @ 20:00:00 | Stop: 2023-04-25

## 2023-04-22 MED ADMIN — pantoprazole (Protonix) injection 40 mg: 40 mg | INTRAVENOUS | @ 03:00:00

## 2023-04-22 MED ADMIN — HYDROmorphone (PF) (DILAUDID) injection 1 mg: 1 mg | INTRAVENOUS | @ 06:00:00 | Stop: 2023-04-25

## 2023-04-22 MED ADMIN — acetaminophen (TYLENOL) oral liquid: 1000 mg | ENTERAL | @ 18:00:00

## 2023-04-22 MED ADMIN — baclofen (LIORESAL) tablet 5 mg: 5 mg | ORAL | @ 14:00:00

## 2023-04-22 MED ADMIN — sodium chloride (NS) 0.9 % flush 10 mL: 10 mL | INTRAVENOUS | @ 11:00:00

## 2023-04-22 MED ADMIN — sodium chloride (NS) 0.9 % flush 10 mL: 10 mL | INTRAVENOUS | @ 18:00:00

## 2023-04-22 MED ADMIN — escitalopram oxalate (LEXAPRO) tablet 5 mg: 5 mg | ENTERAL | @ 14:00:00

## 2023-04-22 MED ADMIN — acetaminophen (TYLENOL) oral liquid: 1000 mg | ENTERAL | @ 03:00:00

## 2023-04-22 MED ADMIN — scopolamine (TRANSDERM-SCOP) 1 mg over 3 days topical patch 1 mg: 1 | TOPICAL | @ 14:00:00

## 2023-04-22 MED ADMIN — oxyCODONE (ROXICODONE) 5 mg/5 mL solution 20 mg: 20 mg | ORAL | @ 13:00:00 | Stop: 2023-04-25

## 2023-04-22 MED ADMIN — baclofen (LIORESAL) tablet 5 mg: 5 mg | ORAL | @ 18:00:00

## 2023-04-22 MED ADMIN — sodium chloride (NS) 0.9 % flush 10 mL: 10 mL | INTRAVENOUS | @ 03:00:00

## 2023-04-22 MED ADMIN — promethazine (PHENERGAN) 12.5 mg in sodium chloride 0.9 % 50 mL IVPB (premix): 12.5 mg | INTRAVENOUS | @ 05:00:00

## 2023-04-22 NOTE — Unmapped (Signed)
Received pt lying comfortably in bed, awake, alert and oriented x 4. With mom inside the room. No distress noted. Ngt (Cortrak) on left nostril patent. Cont to bowel and bladder. Due meds given. PRN Oxy and dilaudid given to control pain. Skin injury prevention and fall precaution observed. Needs attended. Made pt comfortable. Plan of care continued.     Problem: Rehabilitation (IRF) Plan of Care  Goal: Plan of Care Review  Outcome: Progressing  Flowsheets (Taken 04/22/2023 1727)  Progress: improving  Plan of Care Reviewed With:   patient   parent  Goal: Patient-Specific Goal (Individualized)  Outcome: Progressing  Goal: Absence of New-Onset Illness or Injury  Outcome: Progressing  Intervention: Prevent Fall and Fall Injury  Recent Flowsheet Documentation  Taken 04/22/2023 1600 by Hale Drone, RN  Safety Interventions:   fall reduction program maintained   family at bedside   low bed  Taken 04/22/2023 1200 by Hale Drone, RN  Safety Interventions:   fall reduction program maintained   low bed  Taken 04/22/2023 0800 by Hale Drone, RN  Safety Interventions:   fall reduction program maintained   low bed  Intervention: Prevent Skin Injury  Recent Flowsheet Documentation  Taken 04/22/2023 1600 by Hale Drone, RN  Device Skin Pressure Protection: absorbent pad utilized/changed  Skin Protection: incontinence pads utilized  Taken 04/22/2023 1200 by Hale Drone, RN  Skin Protection: incontinence pads utilized  Taken 04/22/2023 0800 by Hale Drone, RN  Device Skin Pressure Protection: absorbent pad utilized/changed  Skin Protection: incontinence pads utilized  Intervention: Prevent Infection  Recent Flowsheet Documentation  Taken 04/22/2023 1600 by Hale Drone, RN  Infection Prevention: hand hygiene promoted  Taken 04/22/2023 1200 by Hale Drone, RN  Infection Prevention:   personal protective equipment utilized   single patient room provided   rest/sleep promoted   hand hygiene promoted  Taken 04/22/2023 0800 by Hale Drone, RN  Infection Prevention:   personal protective equipment utilized   rest/sleep promoted   single patient room provided  Goal: Optimal Comfort and Wellbeing  Outcome: Progressing  Goal: Home and Community Transition Plan Established  Outcome: Progressing  Goal: Rounds/Family Conference  Outcome: Progressing     Problem: Latex Allergy  Goal: Absence of Allergy Symptoms  Outcome: Progressing  Intervention: Maintain Latex-Aware Environment  Recent Flowsheet Documentation  Taken 04/22/2023 1200 by Hale Drone, RN  Latex Precautions: latex precautions maintained     Problem: Self-Care Deficit  Goal: Improved Ability to Complete Activities of Daily Living  Outcome: Progressing     Problem: Fall Injury Risk  Goal: Absence of Fall and Fall-Related Injury  Outcome: Progressing  Intervention: Promote Injury-Free Environment  Recent Flowsheet Documentation  Taken 04/22/2023 1600 by Hale Drone, RN  Safety Interventions:   fall reduction program maintained   family at bedside   low bed  Taken 04/22/2023 1200 by Hale Drone, RN  Safety Interventions:   fall reduction program maintained   low bed  Taken 04/22/2023 0800 by Hale Drone, RN  Safety Interventions:   fall reduction program maintained   low bed     Problem: Malnutrition  Goal: Improved Nutritional Intake  Outcome: Progressing

## 2023-04-22 NOTE — Unmapped (Signed)
Prosthetics and Orthotics Inpatient Encounter    Consult: Toe-Off AFO    Requesting Service: PM&R      Purpose of Consult/encounter     [name] [name] was seen while inpatient for AFO fitting      Pertinent History     Previously provided Right AFO      Actions taken today     Provided left AFO.     Matched pair with right side small Toe-off AFO    Device Data    Base Device  Quantity: 1  Side: Left  Size: Small  Description: Toe Off AFO  Part Number: [id number]  Serial Number: [id number]  Supplier: Other  Base Device Supplier: [employer]  Warranty: 90 Days  Action: Delivered    Device checked for safety and security during the visit today.      Plan    Follow up actions: None at this time    Return Visit:  PRN      Doree Albee, [facility]    *For brace related issues please secure chat Clinical cytogeneticist and Orthotics Clinical Team) or page Prosthetics & Orthotics @ [phone]  *For urgent needs during after-hours or weekends, please page the on-call Prosthetics & Orthotics provider.    Inpatient Instructions: Public affairs consultant [employer] AFO    Purpose/Function  An Ankle Foot Orthosis (AFO) is a common brace meant to support the foot and ankle experiencing drop foot.  It is light weight and durable because of its carbon fiber materials.  The design makes it very easy to fit into most footwear and the strap(s) near the calf makes it very convenient to put on.     Putting device on / taking it off  The AFO should be used with a comfortable sock.  Because the cuff is fabric - the sock just needs to rise above the shoe but longer socks are acceptable.     To don the brace (put it on)  Recommend leaving the AFO inserted in the shoe covered with an insole (the insole which came with the shoe or a store bought insole is fine.    Loosen the laces or Velcro  on the shoe, Free the velcro straps so that they dangle loosely.  Shoes which do not open such as loafers or slip-ons are not reccommended for use with an AFO.  Slide your foot into the shoe.    Position the AFO cuff onto your shin.   Fasten the Velcro or laces on your shoe.    Secure the AFO cuff with the Velcro strap(s)  Pull pant leg over AFO  To remove brace:  Remove Velcro fasteners  Loosen the shoe laces or velcro  Pull gently off foot from bottom  Store brace in clean, dry location with the Velcro fastened to avoid Velcro getting caught on other clothes or accumulating debris.  The brace should always be used with well-fitted sneakers or other footwear.  Always use an insole over the AFO    Wearing your device  The AFO is an active brace - meaning that it is meant to be worn during physical activity.  Your physician may have specific instructions for you.  You should break-in your AFO gradually:   We recommend the following schedule:     Day 1: Wear for 1 hour, off for 1 hour.  Repeat as time allows    Day 2:  Wear for 2 hours, off for 1 hour.  Repeat as time  allows    Day 3: Wear for 3 hours, off for 1 hour.  Repeat as time allows    Day 4: Wear for 4 hours, off for 1 hour.  Repeat as time allows    Day 5 and beyond:   Wear full day  Your AFO is meant to help prevent trips and falls.  You should develop a routine to wearing your AFO so that you are not caught without it.  The AFO is not meant to be worn to bed.      Cleaning / Care / Infection Control  The AFO can be washed in with soap and water.  Use a mild cleaning detergent.  Do not use bleach.  Do not place in washer or dryer - set out to air dry.    The brace is meant to be used on the person for whom it was prescribed.  It should not be shared with other people or on other body parts.      Risks  The most notable risk to you of wearing this brace is skin irritation.  There is a risk of over-use causing skin irritation or blistering.  Be sure to wear a comfortable sock.  Like with new shoes, it may be necessary to break-in the brace by wearing it for short periods of time and checking your skin for any signs of irritation.    While some redness is expected when wearing a brace tightly, it should go away within 20-30 minutes.  If you experience any blisters or signs of rash discontinue wearing and contact your provider.    Does not contain latex   Courtesy of [employer]  How to contact  If you experience any discomfort or have questions about the use of this orthosis you may contact the DMEPOS team by using the inbasket feature of your mychart account.  You may also contact your provider in the same way.  If you would like to reach out by phone, contact [phone]

## 2023-04-22 NOTE — Unmapped (Signed)
Physical Medicine and Rehabilitation  Daily Progress Note [facility]    ASSESSMENT:     [name] [name] is a 23 y.o. female 23 y.o. female with PMH of [name] s/p proctocolectomy and J-pouch, desmoid tumours, chronic abdominal pain and nausea, anxiety, PTSD related to past medical care, DVT RUE s/p Eliquis x3 months admitted for constellation of worsening pain, intolerance of oral intake. She is now admitted to [facility] for comprehensive interdisciplinary rehabilitation.      Rehab Impairment Group Code [facility]):   (Debility) 16 Debility (Non-Cardiac/Non-Pulmonary)   Etiology: worsening pain, intolerance of oral intake     PLAN:     This patient is admitted to the Physical Medicine and Rehabilitation - Inpatient - A service from 8am-5pm on weekdays for questions regarding this patient. After hours, weekends, and holidays please contact the 1st Call resident pager     REHAB:   - PT and OT to maximize functional status with mobility and ADLs as well as prevention of joint contracture.   - P&O for assistive devices PRN.  - To be discussed in weekly Interdisciplinary Team Conference.     Acute on chronic abdominal pain and nausea  Complex situation discussed at length in prior notes. In brief, her pain and nausea are (deeply) multifactorial but consensus among treatment team is that new / unknown organic issue is unlikely. Plan to focus on symptom control, adequate nutrition.  Patient is currently at goal tube feeding, tolerating well.  No visible emesis noted.  Still reports persistent nausea but on aggressive nausea management as below. Chronic pain service consulted, appreciated.  - NO CHANGES to pain regimen without multiparty discussion including patient, unless true emergency.  Seen by chronic pain 1/28, recommended to continue the pain regimen as below no changes made.  - Pain management:  -- oxycodone (liquid) 20 mg q4 prn-first-line for pain  -- changed hydromorphone 1 mg IV q6prn -second line for pain  (ONLY IF inadequate relief 1 hour after oral alternative)  -- buprenorphine 20 mcg/h patch   -- hyoscyamine prn  -- Baclofen 5mg  TID  - Nausea: discuss about transitioning one of the IV nausea medication to p.o.  -- ondansetron IV q8 PRN  -- promethazine IV q6   -- scopolamine patch  -- olanzapine 5 mg in evening  -- no diphenhydramine IV in absence of clear indication  - Dyspepsia:  -- famotidine bid  -- PPI IV q12  - GI motility:  -- naloxegol prn  -- Simethicone 3 times daily  -- okay to use loperamide sparingly     Left arm PICC line site swelling, pain and ecchymosis - DVT  Left arm PICC line site noted mild swelling and mild ecchymosis from insertion.  No visible swelling noted below the elbow.  PVL confirmed acute DVT, started on anticoagulation and removed PICC line on 1/28, given acute DVT also patient is currently not on any IV antibiotic. Removed PICC line 1/28,  swelling around prior picc line site improving, mild redness/ rash  due to tape, still has bruises but resolving.   - Eliquis 5mg  BID; will need 3 months of therapy  - Stopped lovenox     Inadequate oral intake  Abnormal gut anatomy notwithstanding, it is the consensus among hospitalist and GI services that oral and/or enteral feeding should be sufficient to meet caloric and hydration needs. Has not developed refeeding syndrome. TPN will not be offered in forseeable future.  - titrate tube feeds to ~100% USRDI = 65  mL/h: reached at 60cc/hr- tolerated well.   - Per dietitian, patient requested for cyclic feeding , started on cyclic feeding with Nutren 2.0, at goal rate 50 cc/h for 16 hours with acute 4-hour water flushes 150 cc .   - Calorie count per nutrition  - follow BMP, Mg phos q48-72, stop once at goal feeds  - GI to follow and manage wean from enteral nutrition at discharge     Elevated transaminases  Improving: Labs with AST 21, ALT 70, downtrending from prior. No evidence of cholestasis or synthetic dysfunction. Presume drug-related. No longer following closely as trend is well established.     Pouchitis (dx 03/2023), resolving  No plan for repeat endoscopy or colonoscopy during this admission, per GI, given recent exam. No significant decrement in Hb so far  - Cefdinir 300 mg daily (end = 05/05/2023)  - Completed Cefdinir 300 mg bid for 4 weeks (end = 04/07/2023)  - CBC weekly     Desmoid tumors  - okay to hold nirogesat while having difficulty with oral meds, no urgency to resume per Dr [name]     Mood / anxiety  - continue escitalopram  - lorazepam 1 mg bid prn (home medication)     Daily Checklist  - Diet: Regular diet with cyclic tube feeds  - DVT PPX: eliquis  - GI PPX: IV protonix BID  - Access: piv    DISPO: Patient to be discussed at weekly interdisciplinary team conference.   - EDD: 2/19  - Follow-up: PCP, PM&R, GI, Oncology    SUBJECTIVE:     Interval Events:   NAEO. We discussed that her abdominal pain is stable with the addition of the simethicone. She has felt the metamucil is better than the guar gum. IR and oncology spoke and feel that a permanent access line is not indicated at this time.     OBJECTIVE:     Vital signs (last 24 hours):  Temp:  [36.4 ??C (97.5 ??F)-36.8 ??C (98.2 ??F)] 36.4 ??C (97.5 ??F)  Heart Rate:  [79-87] 87  Resp:  [19-20] 19  BP: (101-115)/(50-58) 101/50  MAP (mmHg):  [65-75] 65  SpO2:  [98 %] 98 %    Intake/Output (last 3 shifts):  I/O last 3 completed shifts:  In: 350 [I.V.:20; NG/GT:330]  Out: -     Physical Exam:   GEN: NAD, working with therapies in the gym  EYES: sclera anicteric, conjunctiva clear   HEENT: MMM, NG tube in place  NECK: trachea midline  CV: warm extremities, well perfused, no cyanosis   RESP:  NWOB on RA  ABD: Not distended  SKIN: no visible masses, lesions, rashes, ecchymoses.   MSK: no notable contractures or ext swelling  NEURO:Alert, MAE, speech fluid and coherent, responds appropriately to questions   PSYCH: mood euthymic, affect appropriate, thought process logical Medications:  Scheduled    acetaminophen (TYLENOL) oral liquid Q8H SCH    ammonium lactate (LAC-HYDRIN) 12 % lotion 1 Application BID    apixaban (ELIQUIS) tablet 5 mg BID    baclofen (LIORESAL) tablet 5 mg TID    buprenorphine 20 mcg/hour transdermal patch 1 patch Weekly    cefdinir (OMNICEF) oral suspension Daily    escitalopram oxalate (LEXAPRO) tablet 5 mg Daily    famotidine (PEPCID) oral suspension Nightly    fluticasone propionate (FLONASE) 50 mcg/actuation nasal spray 1 spray Weekly    lidocaine (ASPERCREME) 4 % 1 patch Daily    multivitamin with folic acid 400 mcg tablet  1 tablet Daily    OLANZapine zydis (ZYPREXA) disintegrating tablet 5 mg Q PM    pantoprazole (Protonix) injection 40 mg BID    promethazine (PHENERGAN) 12.5 mg in sodium chloride 0.9 % 50 mL IVPB (premix) Q6H    psyllium (METAMUCIL) 3.4 gram packet 1 packet TID    scopolamine (TRANSDERM-SCOP) 1 mg over 3 days topical patch 1 mg Q72H    simethicone (MYLICON) chewable tablet 80 mg TID    sodium chloride (NS) 0.9 % flush 10 mL Q8H     PRN cetirizine, 20 mg, Daily PRN  hydrocortisone, , BID PRN  hydrocortisone, , BID PRN  HYDROmorphone, 1 mg, Q6H PRN  hyoscyamine, 125 mcg, Q4H PRN  loperamide, 2 mg, QID PRN  LORazepam, 1 mg, BID PRN  melatonin, 3 mg, Nightly PRN  naloxegol, 12.5 mg, Daily PRN  ondansetron, 8 mg, Q8H PRN  oxyCODONE, 20 mg, Q4H PRN  zinc oxide-cod liver oil, , Daily PRN          Labs/Studies: Reviewed     Radiology Results:   XR Abdomen 1 View  Result Date: 04/21/2023  EXAM: XR ABDOMEN 1 VIEW ACCESSION: [id number] UN REPORT DATE: 04/21/2023 8:22 AM     CLINICAL INDICATION: 23 years old with ABDOMINAL PAIN  -  UNSPECIFIED SITE      COMPARISON: Radiographs 04/07/2023     TECHNIQUE: Supine view of the abdomen, 2 image(s)     FINDINGS: Prominent loops of bowel are seen within the mid abdomen and right lower quadrants with bowel gas noted to the level of the rectum. Weighted enteric tube tip projects over the second/third portion of the duodenum. Suture material overlies the pelvis. Lung bases are clear. No acute osseous abnormality.         Stable mild gaseous distention of multiple loops of small bowel throughout the right lower quadrant and pelvis.               Quality Indicators      Hearing, Speech, and Vision  Ability to Hear: Adequate  Ability to See in Adequate Light: Adequate  Expression of Ideas and Wants: Without difficulty  Understanding Verbal and Non-Verbal Content: Understands    Cognitive Pattern Assessment  Cognitive Pattern Assessment Used: BIMS  Brief Interview for Mental Status (BIMS)  Repetition of Three Words (First Attempt): 3  Temporal Orientation: Year: Correct  Temporal Orientation: Month: Accurate within 5 days  Temporal Orientation: Day: Correct  Recall: Sock: Yes, no cue required  Recall: Blue: Yes, no cue required  Recall: Bed: Yes, no cue required  BIMS Summary Score: 15       Nutritional Approaches  Nutritional Approach: Feeding tube    ADLs  Admission Current   Eating Assistance Needed: Set-up / clean-up    CARE Score - 5 Assistance Needed: Independent    CARE Score - 6   Oral Hygiene  Assistance Needed: Physical assistance Total assistance  CARE Score - 1  Assistance Needed: Independent    CARE Score - 6   Bladder Continence       Bowel Continence       Toileting Hygiene Assistance Needed: Incidental touching    CARE Score - 4 Assistance Needed: Incidental touching    CARE Score - 4    Toilet Transfer Assistance Needed: Physical assistance 25% or less  CARE Score - 3 Assistance Needed: Incidental touching    CARE Score - 4   Shower/Bathe Self Assistance Needed: Set-up / clean-up    CARE  Score - 88  Assistance Needed: Set-up / clean-up    CARE Score - 5   Upper Body Dressing Assistance Needed: Physical assistance 26%-50%  CARE Score - 3  Assistance Needed: Independent    CARE Score - 6   Lower Body Dressing Assistance Needed: Physical assistance 76% or more  CARE Score - 2  Assistance Needed: Incidental touching    CARE Score - 4   On/Off Footwear Assistance Needed: Physical assistance Total assistance  CARE Score - 1  Assistance Needed: Independent    CARE Score - 6         Transfers Admission Current   Bed to Chair Assistance Needed: Incidental touching    CARE Score - 4  Assistance Needed: Adaptive equipment, Independent    CARE Score - 6     Lying to Sitting Assistance Needed: Physical assistance 26%-50%  CARE Score - 3  Assistance Needed: Adaptive equipment, Independent    CARE Score - 6   Roll Left/Right Assistance Needed: Physical assistance 25% or less  CARE Score - 3  Assistance Needed: Independent, Adaptive equipment    CARE Score - 6   Sit to Lying Assistance Needed: Physical assistance 26%-50%  CARE Score - 3  Assistance Needed: Independent, Adaptive equipment    CARE Score - 6   Sit to Stand Assistance Needed: Incidental touching    CARE Score - 4  Assistance Needed: Set-up / clean-up, Adaptive equipment    CARE Score - 5         Mobility Admission Current   Walk 10 Feet Assistance Needed: Adaptive equipment, Physical assistance 25% or less  CARE Score - 3 Assistance Needed: Incidental touching, Adaptive equipment    CARE Score - 4     Walk 50 Feet 2 Turns Assistance Needed: Physical assistance 51%-75%  CARE Score - 2 Assistance Needed: Physical assistance    CARE Score - 88   Walk 150 Feet      CARE Score - 88       CARE Score - 88   Walk 10 Feet Uneven      CARE Score - 88       CARE Score - 88   1 Step (Curb) Assistance Needed: Physical assistance, Adaptive equipment 25% or less  CARE Score - 88  Assistance Needed: Physical assistance, Adaptive equipment    CARE Score - 3   4 Steps      CARE Score - 88       CARE Score - 88   12 Steps      CARE Score - 88       CARE Score - 88   Picking Up Object      CARE Score - 10       CARE Score - 88   Wheelchair/Scooter Use        Wheel 50 Feet 2 Turns Assistance Needed: Independent      CARE Score - Wheel 50 Feet with Two Turns: 6    Type of Wheelchair/Scooter: Manual Assistance Needed: Independent      CARE Score - Wheel 50 Feet with Two Turns: 6    Type of Wheelchair/Scooter: Manual   Wheel 150 Feet Assistance Needed: Physical assistance 26%-50%    CARE Score - Wheel 150 Feet: 3    Type of Wheelchair/Scooter: Manual  Assistance Needed: Independent      CARE Score - Wheel 150 Feet: 6    Type of Wheelchair/Scooter: Manual

## 2023-04-22 NOTE — Unmapped (Addendum)
Discharge Services and Resources:      Outpatient Therapy appointments:    Outpatient Therapy referrals have been faxed for you.     Physical Therapy:  Occupational Therapy:    [facility]  [address]   [place], [place]  [phone]      If you have any questions regarding outpatient therapy, please call the number listed above.      Home Medical Equipment:   [facility]  at [phone]     If you have any questions regarding your medical equipment after discharge, please call the above number.

## 2023-04-22 NOTE — Unmapped (Addendum)
Pt is alert and oriented x 4, on room air, no telemetry. Tube feed is going continuously. Medications crushed and administered through NG tube. Pain was unrelieved during the night. Prn Oxycodone used for pain, partially effective. IV Dilaudid given for unrelieved pain. Continent x 2. Fall, aspirations and safety precautions maintained overnight. Bed is locked and in the lowest position possible. Call bell is within reach. Will continue to monitor the pt.    Problem: Rehabilitation (IRF) Plan of Care  Goal: Plan of Care Review  04/22/2023 0342 by Jari Sportsman, RN  Outcome: Progressing  04/22/2023 0342 by Jari Sportsman, RN  Outcome: Progressing  04/22/2023 0341 by Jari Sportsman, RN  Outcome: Progressing  Goal: Patient-Specific Goal (Individualized)  04/22/2023 0342 by Jari Sportsman, RN  Outcome: Progressing  04/22/2023 0342 by Jari Sportsman, RN  Outcome: Progressing  04/22/2023 0341 by Jari Sportsman, RN  Outcome: Progressing  Goal: Absence of New-Onset Illness or Injury  04/22/2023 0342 by Jari Sportsman, RN  Outcome: Progressing  04/22/2023 0342 by Jari Sportsman, RN  Outcome: Progressing  04/22/2023 0341 by Jari Sportsman, RN  Outcome: Progressing  Intervention: Prevent Skin Injury  Recent Flowsheet Documentation  Taken 04/21/2023 2000 by Jari Sportsman, RN  Skin Protection: incontinence pads utilized  Intervention: Prevent VTE (Venous Thromboembolism)  Recent Flowsheet Documentation  Taken 04/21/2023 2000 by Jari Sportsman, RN  VTE Prevention/Management: anticoagulant therapy  Goal: Optimal Comfort and Wellbeing  04/22/2023 0342 by Jari Sportsman, RN  Outcome: Progressing  04/22/2023 0342 by Jari Sportsman, RN  Outcome: Progressing  04/22/2023 0341 by Jari Sportsman, RN  Outcome: Progressing  Goal: Home and Community Transition Plan Established  04/22/2023 0342 by Jari Sportsman, RN  Outcome: Progressing  04/22/2023 0342 by Jari Sportsman, RN  Outcome: Progressing  04/22/2023 0341 by Jari Sportsman, RN  Outcome: Progressing  Goal: Rounds/Family Conference  04/22/2023 0342 by Jari Sportsman, RN  Outcome: Progressing  04/22/2023 0342 by Jari Sportsman, RN  Outcome: Progressing  04/22/2023 0341 by Jari Sportsman, RN  Outcome: Progressing     Problem: Latex Allergy  Goal: Absence of Allergy Symptoms  04/22/2023 0342 by Jari Sportsman, RN  Outcome: Progressing  04/22/2023 0342 by Jari Sportsman, RN  Outcome: Progressing  04/22/2023 0341 by Jari Sportsman, RN  Outcome: Progressing     Problem: Self-Care Deficit  Goal: Improved Ability to Complete Activities of Daily Living  04/22/2023 0342 by Jari Sportsman, RN  Outcome: Progressing  04/22/2023 0342 by Jari Sportsman, RN  Outcome: Progressing  04/22/2023 0341 by Jari Sportsman, RN  Outcome: Progressing     Problem: Fall Injury Risk  Goal: Absence of Fall and Fall-Related Injury  04/22/2023 0342 by Jari Sportsman, RN  Outcome: Progressing  04/22/2023 0342 by Jari Sportsman, RN  Outcome: Progressing  04/22/2023 0341 by Jari Sportsman, RN  Outcome: Progressing     Problem: Malnutrition  Goal: Improved Nutritional Intake  04/22/2023 0342 by Jari Sportsman, RN  Outcome: Progressing  04/22/2023 0342 by Jari Sportsman, RN  Outcome: Progressing  04/22/2023 0341 by Jari Sportsman, RN  Outcome: Progressing

## 2023-04-23 MED ADMIN — ondansetron (ZOFRAN) injection 8 mg: 8 mg | INTRAVENOUS | @ 21:00:00

## 2023-04-23 MED ADMIN — multivitamin with folic acid 400 mcg tablet 1 tablet: 1 | ORAL | @ 15:00:00

## 2023-04-23 MED ADMIN — ondansetron (ZOFRAN) injection 8 mg: 8 mg | INTRAVENOUS | @ 08:00:00

## 2023-04-23 MED ADMIN — apixaban (ELIQUIS) tablet 5 mg: 5 mg | ORAL | @ 15:00:00

## 2023-04-23 MED ADMIN — oxyCODONE (ROXICODONE) 5 mg/5 mL solution 20 mg: 20 mg | ORAL | @ 11:00:00 | Stop: 2023-04-25

## 2023-04-23 MED ADMIN — lidocaine (ASPERCREME) 4 % 1 patch: 1 | TRANSDERMAL | @ 15:00:00

## 2023-04-23 MED ADMIN — oxyCODONE (ROXICODONE) 5 mg/5 mL solution 20 mg: 20 mg | ORAL | @ 01:00:00 | Stop: 2023-04-25

## 2023-04-23 MED ADMIN — promethazine (PHENERGAN) 12.5 mg in sodium chloride 0.9 % 50 mL IVPB (premix): 12.5 mg | INTRAVENOUS | @ 06:00:00

## 2023-04-23 MED ADMIN — sodium chloride (NS) 0.9 % flush 10 mL: 10 mL | INTRAVENOUS | @ 11:00:00

## 2023-04-23 MED ADMIN — apixaban (ELIQUIS) tablet 5 mg: 5 mg | ORAL | @ 03:00:00

## 2023-04-23 MED ADMIN — promethazine (PHENERGAN) 12.5 mg in sodium chloride 0.9 % 50 mL IVPB (premix): 12.5 mg | INTRAVENOUS | @ 17:00:00

## 2023-04-23 MED ADMIN — baclofen (LIORESAL) tablet 5 mg: 5 mg | ORAL | @ 03:00:00

## 2023-04-23 MED ADMIN — baclofen (LIORESAL) tablet 5 mg: 5 mg | ORAL | @ 18:00:00

## 2023-04-23 MED ADMIN — oxyCODONE (ROXICODONE) 5 mg/5 mL solution 20 mg: 20 mg | ORAL | @ 23:00:00 | Stop: 2023-04-25

## 2023-04-23 MED ADMIN — ammonium lactate (LAC-HYDRIN) 12 % lotion 1 Application: 1 | TOPICAL | @ 15:00:00

## 2023-04-23 MED ADMIN — simethicone (MYLICON) chewable tablet 80 mg: 80 mg | ORAL | @ 17:00:00

## 2023-04-23 MED ADMIN — escitalopram oxalate (LEXAPRO) tablet 5 mg: 5 mg | ENTERAL | @ 15:00:00

## 2023-04-23 MED ADMIN — HYDROmorphone (PF) (DILAUDID) injection 1 mg: 1 mg | INTRAVENOUS | @ 15:00:00 | Stop: 2023-04-25

## 2023-04-23 MED ADMIN — promethazine (PHENERGAN) 12.5 mg in sodium chloride 0.9 % 50 mL IVPB (premix): 12.5 mg | INTRAVENOUS | @ 11:00:00

## 2023-04-23 MED ADMIN — cefdinir (OMNICEF) oral suspension: 300 mg | ORAL | @ 15:00:00 | Stop: 2023-05-06

## 2023-04-23 MED ADMIN — simethicone (MYLICON) chewable tablet 80 mg: 80 mg | ORAL | @ 23:00:00

## 2023-04-23 MED ADMIN — baclofen (LIORESAL) tablet 5 mg: 5 mg | ORAL | @ 15:00:00

## 2023-04-23 MED ADMIN — oxyCODONE (ROXICODONE) 5 mg/5 mL solution 20 mg: 20 mg | ORAL | @ 06:00:00 | Stop: 2023-04-25

## 2023-04-23 MED ADMIN — famotidine (PEPCID) oral suspension: 20 mg | ENTERAL | @ 03:00:00

## 2023-04-23 MED ADMIN — pantoprazole (Protonix) injection 40 mg: 40 mg | INTRAVENOUS | @ 03:00:00

## 2023-04-23 MED ADMIN — pantoprazole (Protonix) injection 40 mg: 40 mg | INTRAVENOUS | @ 15:00:00

## 2023-04-23 MED ADMIN — acetaminophen (TYLENOL) oral liquid: 1000 mg | ENTERAL | @ 18:00:00

## 2023-04-23 MED ADMIN — acetaminophen (TYLENOL) oral liquid: 1000 mg | ENTERAL | @ 03:00:00

## 2023-04-23 MED ADMIN — oxyCODONE (ROXICODONE) 5 mg/5 mL solution 20 mg: 20 mg | ORAL | @ 17:00:00 | Stop: 2023-04-25

## 2023-04-23 MED ADMIN — HYDROmorphone (PF) (DILAUDID) injection 1 mg: 1 mg | INTRAVENOUS | @ 21:00:00 | Stop: 2023-04-25

## 2023-04-23 MED ADMIN — HYDROmorphone (PF) (DILAUDID) injection 1 mg: 1 mg | INTRAVENOUS | @ 08:00:00 | Stop: 2023-04-25

## 2023-04-23 MED ADMIN — sodium chloride (NS) 0.9 % flush 10 mL: 10 mL | INTRAVENOUS | @ 03:00:00

## 2023-04-23 MED ADMIN — sodium chloride (NS) 0.9 % flush 10 mL: 10 mL | INTRAVENOUS | @ 18:00:00

## 2023-04-23 MED ADMIN — promethazine (PHENERGAN) 12.5 mg in sodium chloride 0.9 % 50 mL IVPB (premix): 12.5 mg | INTRAVENOUS | @ 23:00:00

## 2023-04-23 MED ADMIN — simethicone (MYLICON) chewable tablet 80 mg: 80 mg | ORAL | @ 11:00:00

## 2023-04-23 MED ADMIN — acetaminophen (TYLENOL) oral liquid: 1000 mg | ENTERAL | @ 11:00:00

## 2023-04-23 NOTE — Unmapped (Signed)
Received pt lying comfortably in bed, awake, alert and oriented x 4. With mom inside the room. No distress noted. Ngt (Cortrak) on left nostril patent. Cont to bowel and bladder. Due meds given. PRN Oxy and dilaudid given to control pain. Skin injury prevention and fall precaution observed. Needs attended. Made pt comfortable. Plan of care continued.      Problem: Rehabilitation (IRF) Plan of Care  Goal: Plan of Care Review  Outcome: Progressing  Flowsheets (Taken 04/23/2023 1518)  Progress: improving  Plan of Care Reviewed With:   patient   spouse  Goal: Patient-Specific Goal (Individualized)  Outcome: Progressing  Goal: Absence of New-Onset Illness or Injury  Outcome: Progressing  Intervention: Prevent Fall and Fall Injury  Recent Flowsheet Documentation  Taken 04/23/2023 1200 by Hale Drone, RN  Safety Interventions:   fall reduction program maintained   low bed  Taken 04/23/2023 1000 by Hale Drone, RN  Safety Interventions:   fall reduction program maintained   low bed  Intervention: Prevent Skin Injury  Recent Flowsheet Documentation  Taken 04/23/2023 1200 by Hale Drone, RN  Device Skin Pressure Protection: absorbent pad utilized/changed  Skin Protection: incontinence pads utilized  Taken 04/23/2023 1000 by Hale Drone, RN  Device Skin Pressure Protection: absorbent pad utilized/changed  Skin Protection: incontinence pads utilized  Intervention: Prevent Infection  Recent Flowsheet Documentation  Taken 04/23/2023 1200 by Hale Drone, RN  Infection Prevention:   hand hygiene promoted   rest/sleep promoted   single patient room provided  Taken 04/23/2023 1000 by Hale Drone, RN  Infection Prevention:   equipment surfaces disinfected   rest/sleep promoted   single patient room provided  Goal: Optimal Comfort and Wellbeing  Outcome: Progressing  Goal: Home and Community Transition Plan Established  Outcome: Progressing  Goal: Rounds/Family Conference  Outcome: Progressing     Problem: Latex Allergy  Goal: Absence of Allergy Symptoms  Outcome: Progressing  Intervention: Maintain Latex-Aware Environment  Recent Flowsheet Documentation  Taken 04/23/2023 1200 by Hale Drone, RN  Latex Precautions: latex precautions maintained  Taken 04/23/2023 1000 by Hale Drone, RN  Latex Precautions: latex precautions maintained     Problem: Self-Care Deficit  Goal: Improved Ability to Complete Activities of Daily Living  Outcome: Progressing     Problem: Fall Injury Risk  Goal: Absence of Fall and Fall-Related Injury  Outcome: Progressing  Intervention: Promote Injury-Free Environment  Recent Flowsheet Documentation  Taken 04/23/2023 1200 by Hale Drone, RN  Safety Interventions:   fall reduction program maintained   low bed  Taken 04/23/2023 1000 by Hale Drone, RN  Safety Interventions:   fall reduction program maintained   low bed     Problem: Malnutrition  Goal: Improved Nutritional Intake  Outcome: Progressing

## 2023-04-23 NOTE — Unmapped (Signed)
Physical Medicine and Rehabilitation  Daily Progress Note [facility]    ASSESSMENT:     [name] [name] is a 23 y.o. female 23 y.o. female with PMH of [name] s/p proctocolectomy and J-pouch, desmoid tumours, chronic abdominal pain and nausea, anxiety, PTSD related to past medical care, DVT RUE s/p Eliquis x3 months admitted for constellation of worsening pain, intolerance of oral intake. She is now admitted to [facility] for comprehensive interdisciplinary rehabilitation.      Rehab Impairment Group Code [facility]):   (Debility) 16 Debility (Non-Cardiac/Non-Pulmonary)   Etiology: worsening pain, intolerance of oral intake     PLAN:     This patient is admitted to the Physical Medicine and Rehabilitation - Inpatient - A service from 8am-5pm on weekdays for questions regarding this patient. After hours, weekends, and holidays please contact the 1st Call resident pager     REHAB:   - PT and OT to maximize functional status with mobility and ADLs as well as prevention of joint contracture.   - P&O for assistive devices PRN.  - To be discussed in weekly Interdisciplinary Team Conference.     Acute on chronic abdominal pain and nausea  Complex situation discussed at length in prior notes. In brief, her pain and nausea are (deeply) multifactorial but consensus among treatment team is that new / unknown organic issue is unlikely. Plan to focus on symptom control, adequate nutrition.  Patient is currently at goal tube feeding, tolerating well.  No visible emesis noted.  Still reports persistent nausea but on aggressive nausea management as below. Chronic pain service consulted, appreciated.  - NO CHANGES to pain regimen without multiparty discussion including patient, unless true emergency.  Seen by chronic pain 1/28, recommended to continue the pain regimen as below no changes made.  - Pain management:  -- oxycodone (liquid) 20 mg q4 prn-first-line for pain  -- changed hydromorphone 1 mg IV q6prn -second line for pain  (ONLY IF inadequate relief 1 hour after oral alternative)  -- buprenorphine 20 mcg/h patch   -- hyoscyamine prn  -- Baclofen 5mg  TID  - Nausea: discuss about transitioning one of the IV nausea medication to p.o.  -- ondansetron IV q8 PRN  -- promethazine IV q6   -- scopolamine patch  -- olanzapine 5 mg in evening  -- no diphenhydramine IV in absence of clear indication  - Dyspepsia:  -- famotidine bid  -- PPI IV q12  - GI motility:  -- naloxegol prn  -- Simethicone 3 times daily  -- okay to use loperamide sparingly     Left arm PICC line site swelling, pain and ecchymosis - DVT  Left arm PICC line site noted mild swelling and mild ecchymosis from insertion.  No visible swelling noted below the elbow.  PVL confirmed acute DVT, started on anticoagulation and removed PICC line on 1/28, given acute DVT also patient is currently not on any IV antibiotic. Removed PICC line 1/28,  swelling around prior picc line site improving, mild redness/ rash  due to tape, still has bruises but resolving.   - Eliquis 5mg  BID; will need 3 months of therapy  - Stopped lovenox     Inadequate oral intake  Abnormal gut anatomy notwithstanding, it is the consensus among hospitalist and GI services that oral and/or enteral feeding should be sufficient to meet caloric and hydration needs. Has not developed refeeding syndrome. TPN will not be offered in forseeable future.  - titrate tube feeds to ~100% USRDI = 65  mL/h: reached at 60cc/hr- tolerated well.   - Per dietitian, patient requested for cyclic feeding , started on cyclic feeding with Nutren 2.0, at goal rate 50 cc/h for 16 hours with acute 4-hour water flushes 150 cc .   - Calorie count per nutrition  - follow BMP, Mg phos q48-72, stop once at goal feeds  - GI to follow and manage wean from enteral nutrition at discharge     Elevated transaminases  Improving: Labs with AST 21, ALT 70, downtrending from prior. No evidence of cholestasis or synthetic dysfunction. Presume drug-related. No longer following closely as trend is well established.     Pouchitis (dx 03/2023), resolving  No plan for repeat endoscopy or colonoscopy during this admission, per GI, given recent exam. No significant decrement in Hb so far  - Cefdinir 300 mg daily (end = 05/05/2023)  - Completed Cefdinir 300 mg bid for 4 weeks (end = 04/07/2023)  - CBC weekly     Desmoid tumors  - okay to hold nirogesat while having difficulty with oral meds, no urgency to resume per Dr [name]     Mood / anxiety  - continue escitalopram  - lorazepam 1 mg bid prn (home medication)     Daily Checklist  - Diet: Regular diet with cyclic tube feeds  - DVT PPX: eliquis  - GI PPX: IV protonix BID  - Access: piv    DISPO: Patient to be discussed at weekly interdisciplinary team conference.   - EDD: 2/19  - Follow-up: PCP, PM&R, GI, Oncology    SUBJECTIVE:     Interval Events:   NAEO. Pt was seen in the therapy gym this morning. Discussed her functional progress with some walking with the Zero G harness and no assistive device yesterday as well as finally making some progress on stairs. Pt has her BL anterior AFOs on today and says they have been very helpful.      OBJECTIVE:     Vital signs (last 24 hours):  Temp:  [36.6 ??C (97.9 ??F)-36.7 ??C (98.1 ??F)] 36.7 ??C (98.1 ??F)  Heart Rate:  [78-79] 79  Resp:  [16-18] 16  BP: (102-114)/(59-61) 102/61  MAP (mmHg):  [73-74] 73  SpO2:  [96 %-98 %] 96 %    Intake/Output (last 3 shifts):  I/O last 3 completed shifts:  In: 120 [I.V.:20; NG/GT:100]  Out: 0     Physical Exam:   GEN: NAD, working with therapies in the gym  EYES: sclera anicteric, conjunctiva clear   HEENT: MMM, NG tube in place  NECK: trachea midline  CV: warm extremities, well perfused, no cyanosis   RESP:  NWOB on RA  ABD: Not distended  SKIN: no visible masses, lesions, rashes, ecchymoses.   MSK: no notable contractures or ext swelling  NEURO:Alert, MAE, speech fluid and coherent, responds appropriately to questions   PSYCH: mood euthymic, affect appropriate, thought process logical         Medications:  Scheduled    acetaminophen (TYLENOL) oral liquid Q8H SCH    ammonium lactate (LAC-HYDRIN) 12 % lotion 1 Application BID    apixaban (ELIQUIS) tablet 5 mg BID    baclofen (LIORESAL) tablet 5 mg TID    buprenorphine 20 mcg/hour transdermal patch 1 patch Weekly    cefdinir (OMNICEF) oral suspension Daily    escitalopram oxalate (LEXAPRO) tablet 5 mg Daily    famotidine (PEPCID) oral suspension Nightly    fluticasone propionate (FLONASE) 50 mcg/actuation nasal spray 1 spray Weekly  lidocaine (ASPERCREME) 4 % 1 patch Daily    multivitamin with folic acid 400 mcg tablet 1 tablet Daily    OLANZapine zydis (ZYPREXA) disintegrating tablet 5 mg Q PM    pantoprazole (Protonix) injection 40 mg BID    promethazine (PHENERGAN) 12.5 mg in sodium chloride 0.9 % 50 mL IVPB (premix) Q6H    psyllium (METAMUCIL) 3.4 gram packet 1 packet TID    scopolamine (TRANSDERM-SCOP) 1 mg over 3 days topical patch 1 mg Q72H    simethicone (MYLICON) chewable tablet 80 mg TID    sodium chloride (NS) 0.9 % flush 10 mL Q8H     PRN cetirizine, 20 mg, Daily PRN  hydrocortisone, , BID PRN  hydrocortisone, , BID PRN  HYDROmorphone, 1 mg, Q6H PRN  hyoscyamine, 125 mcg, Q4H PRN  loperamide, 2 mg, QID PRN  LORazepam, 1 mg, BID PRN  melatonin, 3 mg, Nightly PRN  naloxegol, 12.5 mg, Daily PRN  ondansetron, 8 mg, Q8H PRN  oxyCODONE, 20 mg, Q4H PRN  zinc oxide-cod liver oil, , Daily PRN          Labs/Studies: Reviewed     Radiology Results:   No results found.          Quality Indicators      Hearing, Speech, and Vision  Ability to Hear: Adequate  Ability to See in Adequate Light: Adequate  Expression of Ideas and Wants: Without difficulty  Understanding Verbal and Non-Verbal Content: Understands    Cognitive Pattern Assessment  Cognitive Pattern Assessment Used: BIMS  Brief Interview for Mental Status (BIMS)  Repetition of Three Words (First Attempt): 3  Temporal Orientation: Year: Correct  Temporal Orientation: Month: Accurate within 5 days  Temporal Orientation: Day: Correct  Recall: Sock: Yes, no cue required  Recall: Blue: Yes, no cue required  Recall: Bed: Yes, no cue required  BIMS Summary Score: 15       Nutritional Approaches  Nutritional Approach: Feeding tube    ADLs  Admission Current   Eating Assistance Needed: Set-up / clean-up    CARE Score - 5 Assistance Needed: Independent    CARE Score - 6   Oral Hygiene  Assistance Needed: Physical assistance Total assistance  CARE Score - 1  Assistance Needed: Independent    CARE Score - 6   Bladder Continence       Bowel Continence       Toileting Hygiene Assistance Needed: Incidental touching    CARE Score - 4 Assistance Needed: Incidental touching    CARE Score - 4    Toilet Transfer Assistance Needed: Physical assistance 25% or less  CARE Score - 3 Assistance Needed: Incidental touching    CARE Score - 4   Shower/Bathe Self Assistance Needed: Set-up / clean-up    CARE Score - 88  Assistance Needed: Set-up / clean-up    CARE Score - 5   Upper Body Dressing Assistance Needed: Physical assistance 26%-50%  CARE Score - 3  Assistance Needed: Independent    CARE Score - 6   Lower Body Dressing Assistance Needed: Physical assistance 76% or more  CARE Score - 2  Assistance Needed: Incidental touching    CARE Score - 4   On/Off Footwear Assistance Needed: Physical assistance Total assistance  CARE Score - 1  Assistance Needed: Independent    CARE Score - 6         Transfers Admission Current   Bed to Chair Assistance Needed: Incidental touching    CARE Score -  4  Assistance Needed: Adaptive equipment, Independent    CARE Score - 6     Lying to Sitting Assistance Needed: Physical assistance 26%-50%  CARE Score - 3  Assistance Needed: Adaptive equipment, Independent    CARE Score - 6   Roll Left/Right Assistance Needed: Physical assistance 25% or less  CARE Score - 3  Assistance Needed: Independent, Adaptive equipment    CARE Score - 6   Sit to Lying Assistance Needed: Physical assistance 26%-50%  CARE Score - 3  Assistance Needed: Independent, Adaptive equipment    CARE Score - 6   Sit to Stand Assistance Needed: Incidental touching    CARE Score - 4  Assistance Needed: Set-up / clean-up, Adaptive equipment    CARE Score - 5         Mobility Admission Current   Walk 10 Feet Assistance Needed: Adaptive equipment, Physical assistance 25% or less  CARE Score - 3 Assistance Needed: Incidental touching, Adaptive equipment    CARE Score - 4     Walk 50 Feet 2 Turns Assistance Needed: Physical assistance 51%-75%  CARE Score - 2 Assistance Needed: Physical assistance    CARE Score - 88   Walk 150 Feet      CARE Score - 88       CARE Score - 88   Walk 10 Feet Uneven      CARE Score - 88       CARE Score - 88   1 Step (Curb) Assistance Needed: Physical assistance, Adaptive equipment 25% or less  CARE Score - 88  Assistance Needed: Physical assistance, Adaptive equipment    CARE Score - 3   4 Steps      CARE Score - 88       CARE Score - 88   12 Steps      CARE Score - 88       CARE Score - 88   Picking Up Object      CARE Score - 10       CARE Score - 88   Wheelchair/Scooter Use        Wheel 50 Feet 2 Turns Assistance Needed: Independent      CARE Score - Wheel 50 Feet with Two Turns: 6    Type of Wheelchair/Scooter: Manual Assistance Needed: Independent      CARE Score - Wheel 50 Feet with Two Turns: 6    Type of Wheelchair/Scooter: Manual   Wheel 150 Feet Assistance Needed: Physical assistance 26%-50%    CARE Score - Wheel 150 Feet: 3    Type of Wheelchair/Scooter: Manual  Assistance Needed: Independent      CARE Score - Wheel 150 Feet: 6    Type of Wheelchair/Scooter: Manual

## 2023-04-23 NOTE — Unmapped (Addendum)
Patient is A&Ox4. Endorses moderate to severe pain on abdomen , back and LUA. Mother at bedside overnight. NGT on the left nare intact/patent; on tube feed 1600-800. Reports nausea.PIV G20 on the right FA patent; dressing cdi. Scopolamine patch  behind left ear;Butrans Patch on the left upper back in place. Continent x 2. Last BM: 2/13. Skin injury prevention. Fall and aspiration precautions maintained. Plan of care reviewed.    Problem: Rehabilitation (IRF) Plan of Care  Goal: Plan of Care Review  Outcome: Progressing  Flowsheets (Taken 04/23/2023 0154)  Progress: improving  Plan of Care Reviewed With: patient  Goal: Patient-Specific Goal (Individualized)  Outcome: Progressing  Goal: Absence of New-Onset Illness or Injury  Outcome: Progressing  Intervention: Prevent Fall and Fall Injury  Recent Flowsheet Documentation  Taken 04/23/2023 0000 by Loann Quill, RN  Safety Interventions:   fall reduction program maintained   family at bedside  Taken 04/22/2023 2200 by Loann Quill, RN  Safety Interventions:   fall reduction program maintained   family at bedside  Taken 04/22/2023 2000 by Loann Quill, RN  Safety Interventions:   bleeding precautions   fall reduction program maintained   low bed   infection management   room near unit station   aspiration precautions   family at bedside  Taken 04/22/2023 1920 by Loann Quill, RN  Safety Interventions:   fall reduction program maintained   family at bedside  Intervention: Prevent Skin Injury  Recent Flowsheet Documentation  Taken 04/23/2023 0000 by Loann Quill, RN  Device Skin Pressure Protection: absorbent pad utilized/changed  Skin Protection:   adhesive use limited   incontinence pads utilized  Taken 04/22/2023 2200 by Loann Quill, RN  Device Skin Pressure Protection: absorbent pad utilized/changed  Skin Protection:   adhesive use limited   incontinence pads utilized  Taken 04/22/2023 2000 by Loann Quill, RN  Device Skin Pressure Protection:   absorbent pad utilized/changed   adhesive use limited   positioning supports utilized  Skin Protection:   adhesive use limited   incontinence pads utilized  Intervention: Prevent Infection  Recent Flowsheet Documentation  Taken 04/22/2023 2000 by Loann Quill, RN  Infection Prevention:   environmental surveillance performed   equipment surfaces disinfected   hand hygiene promoted   personal protective equipment utilized   rest/sleep promoted  Intervention: Prevent VTE (Venous Thromboembolism)  Recent Flowsheet Documentation  Taken 04/22/2023 2145 by Loann Quill, RN  VTE Prevention/Management:   anticoagulant therapy   bleeding precautions maintained   bleeding risk factors identified   dorsiflexion/plantar flexion performed  Goal: Optimal Comfort and Wellbeing  Outcome: Progressing  Goal: Home and Community Transition Plan Established  Outcome: Progressing  Goal: Rounds/Family Conference  Outcome: Progressing     Problem: Rehabilitation (IRF) Plan of Care  Goal: Patient-Specific Goal (Individualized)  Outcome: Progressing     Problem: Latex Allergy  Goal: Absence of Allergy Symptoms  Outcome: Progressing  Intervention: Maintain Latex-Aware Environment  Recent Flowsheet Documentation  Taken 04/22/2023 2000 by Loann Quill, RN  Latex Precautions: latex precautions maintained     Problem: Self-Care Deficit  Goal: Improved Ability to Complete Activities of Daily Living  Outcome: Progressing     Problem: Fall Injury Risk  Goal: Absence of Fall and Fall-Related Injury  Outcome: Progressing  Intervention: Promote Injury-Free Environment  Recent Flowsheet Documentation  Taken 04/23/2023 0000 by Loann Quill, RN  Safety Interventions:   fall reduction program maintained   family at  bedside  Taken 04/22/2023 2200 by Loann Quill, RN  Safety Interventions:   fall reduction program maintained   family at bedside  Taken 04/22/2023 2000 by Loann Quill, RN  Safety Interventions:   bleeding precautions   fall reduction program maintained   low bed   infection management   room near unit station   aspiration precautions   family at bedside  Taken 04/22/2023 1920 by Loann Quill, RN  Safety Interventions:   fall reduction program maintained   family at bedside     Problem: Malnutrition  Goal: Improved Nutritional Intake  Outcome: Progressing

## 2023-04-24 LAB — CBC
HEMATOCRIT: 34.9 % (ref 34.0–44.0)
HEMOGLOBIN: 11.7 g/dL (ref 11.3–14.9)
MEAN CORPUSCULAR HEMOGLOBIN CONC: 33.4 g/dL (ref 32.0–36.0)
MEAN CORPUSCULAR HEMOGLOBIN: 28.3 pg (ref 25.9–32.4)
MEAN CORPUSCULAR VOLUME: 84.8 fL (ref 77.6–95.7)
MEAN PLATELET VOLUME: 10 fL (ref 6.8–10.7)
PLATELET COUNT: 229 10*9/L (ref 150–450)
RED BLOOD CELL COUNT: 4.12 10*12/L (ref 3.95–5.13)
RED CELL DISTRIBUTION WIDTH: 17 % — ABNORMAL HIGH (ref 12.2–15.2)
WBC ADJUSTED: 8.5 10*9/L (ref 3.6–11.2)

## 2023-04-24 LAB — BASIC METABOLIC PANEL
ANION GAP: 10 mmol/L (ref 5–14)
BLOOD UREA NITROGEN: 11 mg/dL (ref 9–23)
BUN / CREAT RATIO: 26
CALCIUM: 9.6 mg/dL (ref 8.7–10.4)
CHLORIDE: 101 mmol/L (ref 98–107)
CO2: 29.6 mmol/L (ref 20.0–31.0)
CREATININE: 0.43 mg/dL — ABNORMAL LOW (ref 0.55–1.02)
EGFR CKD-EPI (2021) FEMALE: >90 mL/min/{1.73_m2} (ref >=60)
GLUCOSE RANDOM: 113 mg/dL (ref 70–179)
POTASSIUM: 3.9 mmol/L (ref 3.4–4.8)
SODIUM: 141 mmol/L (ref 135–145)

## 2023-04-24 LAB — MAGNESIUM: MAGNESIUM: 1.9 mg/dL (ref 1.6–2.6)

## 2023-04-24 LAB — PHOSPHORUS: PHOSPHORUS: 4.7 mg/dL (ref 2.4–5.1)

## 2023-04-24 MED ADMIN — baclofen (LIORESAL) tablet 5 mg: 5 mg | ORAL | @ 19:00:00

## 2023-04-24 MED ADMIN — psyllium (METAMUCIL) 3.4 gram packet 1 packet: 1 | ORAL | @ 19:00:00

## 2023-04-24 MED ADMIN — acetaminophen (TYLENOL) oral liquid: 1000 mg | ENTERAL | @ 03:00:00

## 2023-04-24 MED ADMIN — acetaminophen (TYLENOL) oral liquid: 1000 mg | ENTERAL | @ 11:00:00

## 2023-04-24 MED ADMIN — psyllium (METAMUCIL) 3.4 gram packet 1 packet: 1 | ORAL | @ 14:00:00

## 2023-04-24 MED ADMIN — HYDROmorphone (PF) (DILAUDID) injection 1 mg: 1 mg | INTRAVENOUS | @ 17:00:00 | Stop: 2023-05-05

## 2023-04-24 MED ADMIN — oxyCODONE (ROXICODONE) 5 mg/5 mL solution 20 mg: 20 mg | ORAL | @ 06:00:00 | Stop: 2023-05-05

## 2023-04-24 MED ADMIN — oxyCODONE (ROXICODONE) 5 mg/5 mL solution 20 mg: 20 mg | ORAL | @ 14:00:00 | Stop: 2023-05-05

## 2023-04-24 MED ADMIN — lidocaine (ASPERCREME) 4 % 1 patch: 1 | TRANSDERMAL | @ 14:00:00

## 2023-04-24 MED ADMIN — promethazine (PHENERGAN) 12.5 mg in sodium chloride 0.9 % 50 mL IVPB (premix): 12.5 mg | INTRAVENOUS | @ 11:00:00

## 2023-04-24 MED ADMIN — escitalopram oxalate (LEXAPRO) tablet 5 mg: 5 mg | ENTERAL | @ 14:00:00

## 2023-04-24 MED ADMIN — psyllium (METAMUCIL) 3.4 gram packet 1 packet: 1 | ORAL | @ 02:00:00

## 2023-04-24 MED ADMIN — cefdinir (OMNICEF) oral suspension: 300 mg | ORAL | @ 14:00:00 | Stop: 2023-05-06

## 2023-04-24 MED ADMIN — sodium chloride (NS) 0.9 % flush 10 mL: 10 mL | INTRAVENOUS | @ 11:00:00

## 2023-04-24 MED ADMIN — baclofen (LIORESAL) tablet 5 mg: 5 mg | ORAL | @ 02:00:00

## 2023-04-24 MED ADMIN — pantoprazole (Protonix) injection 40 mg: 40 mg | INTRAVENOUS | @ 14:00:00

## 2023-04-24 MED ADMIN — multivitamin with folic acid 400 mcg tablet 1 tablet: 1 | ORAL | @ 14:00:00

## 2023-04-24 MED ADMIN — famotidine (PEPCID) oral suspension: 20 mg | ENTERAL | @ 02:00:00

## 2023-04-24 MED ADMIN — sodium chloride (NS) 0.9 % flush 10 mL: 10 mL | INTRAVENOUS | @ 03:00:00

## 2023-04-24 MED ADMIN — simethicone (MYLICON) chewable tablet 80 mg: 80 mg | ORAL | @ 17:00:00

## 2023-04-24 MED ADMIN — apixaban (ELIQUIS) tablet 5 mg: 5 mg | ORAL | @ 02:00:00

## 2023-04-24 MED ADMIN — HYDROmorphone (PF) (DILAUDID) injection 1 mg: 1 mg | INTRAVENOUS | @ 04:00:00 | Stop: 2023-04-25

## 2023-04-24 MED ADMIN — HYDROmorphone (PF) (DILAUDID) injection 1 mg: 1 mg | INTRAVENOUS | @ 23:00:00 | Stop: 2023-05-05

## 2023-04-24 MED ADMIN — baclofen (LIORESAL) tablet 5 mg: 5 mg | ORAL | @ 14:00:00

## 2023-04-24 MED ADMIN — simethicone (MYLICON) chewable tablet 80 mg: 80 mg | ORAL | @ 11:00:00

## 2023-04-24 MED ADMIN — ammonium lactate (LAC-HYDRIN) 12 % lotion 1 Application: 1 | TOPICAL | @ 14:00:00

## 2023-04-24 MED ADMIN — apixaban (ELIQUIS) tablet 5 mg: 5 mg | ORAL | @ 14:00:00

## 2023-04-24 MED ADMIN — acetaminophen (TYLENOL) oral liquid: 1000 mg | ENTERAL | @ 19:00:00

## 2023-04-24 MED ADMIN — HYDROmorphone (PF) (DILAUDID) injection 1 mg: 1 mg | INTRAVENOUS | @ 11:00:00 | Stop: 2023-05-05

## 2023-04-24 MED ADMIN — promethazine (PHENERGAN) 12.5 mg in sodium chloride 0.9 % 50 mL IVPB (premix): 12.5 mg | INTRAVENOUS | @ 05:00:00

## 2023-04-24 MED ADMIN — promethazine (PHENERGAN) 12.5 mg in sodium chloride 0.9 % 50 mL IVPB (premix): 12.5 mg | INTRAVENOUS | @ 17:00:00

## 2023-04-24 MED ADMIN — oxyCODONE (ROXICODONE) 5 mg/5 mL solution 20 mg: 20 mg | ORAL | @ 19:00:00 | Stop: 2023-05-05

## 2023-04-24 MED ADMIN — pantoprazole (Protonix) injection 40 mg: 40 mg | INTRAVENOUS | @ 02:00:00

## 2023-04-24 NOTE — Unmapped (Incomplete)
 Patient [name] [name],RA. Takes meds through NG tube to left nare, can take orally, but prefers not to, she was able to take oral simethicone today orally. Given PRN pain meds today x4. Continent B&B. Ambulating with rollator walker and standby assist. No additional educational needs noted at this time.  Problem: Rehabilitation (IRF) Plan of Care  Goal: Plan of Care Review  Outcome: Ongoing - Unchanged  Flowsheets (Taken 04/24/2023 1705)  Progress: no change  Plan of Care Reviewed With: patient  Goal: Patient-Specific Goal (Individualized)  Outcome: Ongoing - Unchanged  Goal: Absence of New-Onset Illness or Injury  Outcome: Ongoing - Unchanged  Intervention: Prevent Fall and Fall Injury  Recent Flowsheet Documentation  Taken 04/24/2023 1600 by Olena Leatherwood, RN  Safety Interventions:   fall reduction program maintained   low bed   lighting adjusted for tasks/safety  Taken 04/24/2023 1200 by Olena Leatherwood, RN  Safety Interventions:   fall reduction program maintained   low bed   lighting adjusted for tasks/safety  Taken 04/24/2023 0800 by Olena Leatherwood, RN  Safety Interventions:   fall reduction program maintained   low bed   lighting adjusted for tasks/safety  Taken 04/24/2023 0718 by Olena Leatherwood, RN  Safety Interventions:   fall reduction program maintained   low bed   lighting adjusted for tasks/safety  Intervention: Prevent VTE (Venous Thromboembolism)  Recent Flowsheet Documentation  Taken 04/24/2023 0831 by Olena Leatherwood, RN  VTE Prevention/Management: (Eliquis) other (see comments)  Goal: Optimal Comfort and Wellbeing  Outcome: Ongoing - Unchanged  Goal: Home and Community Transition Plan Established  Outcome: Ongoing - Unchanged  Goal: Rounds/Family Conference  Outcome: Ongoing - Unchanged     Problem: Latex Allergy  Goal: Absence of Allergy Symptoms  Outcome: Ongoing - Unchanged     Problem: Self-Care Deficit  Goal: Improved Ability to Complete Activities of Daily Living  Outcome: Ongoing - Unchanged     Problem: Fall Injury Risk  Goal: Absence of Fall and Fall-Related Injury  Outcome: Ongoing - Unchanged  Intervention: Promote Injury-Free Environment  Recent Flowsheet Documentation  Taken 04/24/2023 1600 by Olena Leatherwood, RN  Safety Interventions:   fall reduction program maintained   low bed   lighting adjusted for tasks/safety  Taken 04/24/2023 1200 by Olena Leatherwood, RN  Safety Interventions:   fall reduction program maintained   low bed   lighting adjusted for tasks/safety  Taken 04/24/2023 0800 by Olena Leatherwood, RN  Safety Interventions:   fall reduction program maintained   low bed   lighting adjusted for tasks/safety  Taken 04/24/2023 0718 by Olena Leatherwood, RN  Safety Interventions:   fall reduction program maintained   low bed   lighting adjusted for tasks/safety     Problem: Malnutrition  Goal: Improved Nutritional Intake  Outcome: Ongoing - Unchanged

## 2023-04-24 NOTE — Unmapped (Signed)
 Physical Medicine and Rehabilitation  Daily Progress Note [facility]    ASSESSMENT:     [name] [name] is a 23 y.o. female 23 y.o. female with PMH of [name] s/p proctocolectomy and J-pouch, desmoid tumours, chronic abdominal pain and nausea, anxiety, PTSD related to past medical care, DVT RUE s/p Eliquis x3 months admitted for constellation of worsening pain, intolerance of oral intake. She is now admitted to [facility] for comprehensive interdisciplinary rehabilitation.      Rehab Impairment Group Code [facility]):   (Debility) 16 Debility (Non-Cardiac/Non-Pulmonary)   Etiology: worsening pain, intolerance of oral intake     PLAN:     This patient is admitted to the Physical Medicine and Rehabilitation - Inpatient - A service from 8am-5pm on weekdays for questions regarding this patient. After hours, weekends, and holidays please contact the 1st Call resident pager     REHAB:   - PT and OT to maximize functional status with mobility and ADLs as well as prevention of joint contracture.   - P&O for assistive devices PRN.  - To be discussed in weekly Interdisciplinary Team Conference.     Acute on chronic abdominal pain and nausea  Complex situation discussed at length in prior notes. In brief, her pain and nausea are (deeply) multifactorial but consensus among treatment team is that new / unknown organic issue is unlikely. Plan to focus on symptom control, adequate nutrition.  Patient is currently at goal tube feeding, tolerating well.  No visible emesis noted.  Still reports persistent nausea but on aggressive nausea management as below. Chronic pain service consulted, appreciated.  - NO CHANGES to pain regimen without multiparty discussion including patient, unless true emergency.  Seen by chronic pain 1/28, recommended to continue the pain regimen as below no changes made.  - Pain management:  -- oxycodone (liquid) 20 mg q4 prn-first-line for pain  -- changed hydromorphone 1 mg IV q6prn -second line for pain  (ONLY IF inadequate relief 1 hour after oral alternative)  -- buprenorphine 20 mcg/h patch   -- hyoscyamine prn  -- Baclofen 5mg  TID  - Nausea: discuss about transitioning one of the IV nausea medication to p.o.  -- ondansetron IV q8 PRN  -- promethazine IV q6   -- scopolamine patch  -- olanzapine 5 mg in evening  -- no diphenhydramine IV in absence of clear indication  - Dyspepsia:  -- famotidine bid  -- PPI IV q12  - GI motility:  -- naloxegol prn  -- Simethicone 3 times daily  -- okay to use loperamide sparingly     Left arm PICC line site swelling, pain and ecchymosis - DVT  Left arm PICC line site noted mild swelling and mild ecchymosis from insertion.  No visible swelling noted below the elbow.  PVL confirmed acute DVT, started on anticoagulation and removed PICC line on 1/28, given acute DVT also patient is currently not on any IV antibiotic. Removed PICC line 1/28,  swelling around prior picc line site improving, mild redness/ rash  due to tape, still has bruises but resolving.   - Eliquis 5mg  BID; will need 3 months of therapy  - Stopped lovenox     Inadequate oral intake  Abnormal gut anatomy notwithstanding, it is the consensus among hospitalist and GI services that oral and/or enteral feeding should be sufficient to meet caloric and hydration needs. Has not developed refeeding syndrome. TPN will not be offered in forseeable future.  - cyclic feeding with Nutren 2.0, at goal rate  50 cc/h for 16 hours with free water flushes every 3 hours of 75cc.   - Calorie count per nutrition  [ ]  GI to follow and manage wean from enteral nutrition at discharge     Elevated transaminases  Improving: Labs with AST 21, ALT 70, downtrending from prior. No evidence of cholestasis or synthetic dysfunction. Presume drug-related. No longer following closely as trend is well established.     Pouchitis (dx 03/2023), resolving  No plan for repeat endoscopy or colonoscopy during this admission, per GI, given recent exam. No significant decrement in Hb so far. s/p Cefdinir 300 mg bid for 4 weeks (end = 04/07/2023)  - Cefdinir 300 mg daily (end = 05/05/2023)  - CBC weekly     Desmoid tumors  - okay to hold nirogesat while having difficulty with oral meds, no urgency to resume per Dr [name]     Mood / anxiety  - continue escitalopram  - lorazepam 1 mg bid prn (home medication)     Daily Checklist  - Diet: Regular diet with cyclic tube feeds  - DVT PPX: eliquis  - GI PPX: IV protonix BID  - Access: piv    DISPO: Patient to be discussed at weekly interdisciplinary team conference.   - EDD: 2/19  - Follow-up: PCP, PM&R, GI, Oncology    SUBJECTIVE:     Interval Events:   NAEO.  Seen working with therapies going up the stairs. She had some concerns regarding her blood on her stools.  Hemoglobin is stable at 11.7 this morning. She reports having a difficult night with BMs over night that kept her up.     OBJECTIVE:     Vital signs (last 24 hours):  Temp:  [36.6 ??C (97.9 ??F)-36.7 ??C (98.1 ??F)] 36.6 ??C (97.9 ??F)  Heart Rate:  [79-85] 85  Resp:  [16-18] 16  BP: (114-115)/(58-71) 114/71  MAP (mmHg):  [75-85] 85  SpO2:  [95 %-98 %] 95 %    Intake/Output (last 3 shifts):  I/O last 3 completed shifts:  In: 140 [I.V.:40; NG/GT:100]  Out: 0     Physical Exam:   GEN: NAD, working with therapy on the stairs.  EYES: sclera anicteric, conjunctiva clear   HEENT: MMM, NG tube in place  NECK: trachea midline  CV: warm extremities, well perfused, no cyanosis   RESP:  NWOB on RA  ABD: Not distended  SKIN: no visible masses, lesions, rashes, ecchymoses.   MSK: no notable contractures or ext swelling  NEURO:Alert, MAE, speech fluid and coherent, responds appropriately to questions   PSYCH: mood euthymic, affect appropriate, thought process logical         Medications:  Scheduled   ??? acetaminophen (TYLENOL) oral liquid Q8H SCH   ??? ammonium lactate (LAC-HYDRIN) 12 % lotion 1 Application BID   ??? apixaban (ELIQUIS) tablet 5 mg BID   ??? baclofen (LIORESAL) tablet 5 mg TID   ??? buprenorphine 20 mcg/hour transdermal patch 1 patch Weekly   ??? cefdinir (OMNICEF) oral suspension Daily   ??? escitalopram oxalate (LEXAPRO) tablet 5 mg Daily   ??? famotidine (PEPCID) oral suspension Nightly   ??? fluticasone propionate (FLONASE) 50 mcg/actuation nasal spray 1 spray Weekly   ??? lidocaine (ASPERCREME) 4 % 1 patch Daily   ??? multivitamin with folic acid 400 mcg tablet 1 tablet Daily   ??? OLANZapine zydis (ZYPREXA) disintegrating tablet 5 mg Q PM   ??? pantoprazole (Protonix) injection 40 mg BID   ??? promethazine (PHENERGAN) 12.5 mg  in sodium chloride 0.9 % 50 mL IVPB (premix) Q6H   ??? psyllium (METAMUCIL) 3.4 gram packet 1 packet TID   ??? scopolamine (TRANSDERM-SCOP) 1 mg over 3 days topical patch 1 mg Q72H   ??? simethicone (MYLICON) chewable tablet 80 mg TID   ??? sodium chloride (NS) 0.9 % flush 10 mL Q8H     PRN cetirizine, 20 mg, Daily PRN  hydrocortisone, , BID PRN  hydrocortisone, , BID PRN  HYDROmorphone, 1 mg, Q6H PRN  hyoscyamine, 125 mcg, Q4H PRN  loperamide, 2 mg, QID PRN  LORazepam, 1 mg, BID PRN  melatonin, 3 mg, Nightly PRN  naloxegol, 12.5 mg, Daily PRN  ondansetron, 8 mg, Q8H PRN  oxyCODONE, 20 mg, Q4H PRN  zinc oxide-cod liver oil, , Daily PRN          Labs/Studies: Reviewed     Radiology Results:   No results found.          Quality Indicators      Hearing, Speech, and Vision  Ability to Hear: Adequate  Ability to See in Adequate Light: Adequate  Expression of Ideas and Wants: Without difficulty  Understanding Verbal and Non-Verbal Content: Understands    Cognitive Pattern Assessment  Cognitive Pattern Assessment Used: BIMS  Brief Interview for Mental Status (BIMS)  Repetition of Three Words (First Attempt): 3  Temporal Orientation: Year: Correct  Temporal Orientation: Month: Accurate within 5 days  Temporal Orientation: Day: Correct  Recall: Sock: Yes, no cue required  Recall: Blue: Yes, no cue required  Recall: Bed: Yes, no cue required  BIMS Summary Score: 15       Nutritional Approaches  Nutritional Approach: Feeding tube    ADLs  Admission Current   Eating Assistance Needed: Set-up / clean-up    CARE Score - 5 Assistance Needed: Independent    CARE Score - 6   Oral Hygiene  Assistance Needed: Physical assistance Total assistance  CARE Score - 1  Assistance Needed: Independent    CARE Score - 6   Bladder Continence       Bowel Continence       Toileting Hygiene Assistance Needed: Incidental touching    CARE Score - 4 Assistance Needed: Incidental touching    CARE Score - 4    Toilet Transfer Assistance Needed: Physical assistance 25% or less  CARE Score - 3 Assistance Needed: Incidental touching    CARE Score - 4   Shower/Bathe Self Assistance Needed: Set-up / clean-up    CARE Score - 88  Assistance Needed: Set-up / clean-up    CARE Score - 5   Upper Body Dressing Assistance Needed: Physical assistance 26%-50%  CARE Score - 3  Assistance Needed: Independent    CARE Score - 6   Lower Body Dressing Assistance Needed: Physical assistance 76% or more  CARE Score - 2  Assistance Needed: Incidental touching    CARE Score - 4   On/Off Footwear Assistance Needed: Physical assistance Total assistance  CARE Score - 1  Assistance Needed: Independent    CARE Score - 6         Transfers Admission Current   Bed to Chair Assistance Needed: Incidental touching    CARE Score - 4  Assistance Needed: Set-up / clean-up    CARE Score - 5     Lying to Sitting Assistance Needed: Physical assistance 26%-50%  CARE Score - 3  Assistance Needed: Adaptive equipment, Independent    CARE Score - 6   Roll  Left/Right Assistance Needed: Physical assistance 25% or less  CARE Score - 3  Assistance Needed: Independent, Adaptive equipment    CARE Score - 6   Sit to Lying Assistance Needed: Physical assistance 26%-50%  CARE Score - 3  Assistance Needed: Independent, Adaptive equipment    CARE Score - 6   Sit to Stand Assistance Needed: Incidental touching    CARE Score - 4  Assistance Needed: Set-up / clean-up, Adaptive equipment    CARE Score - 5         Mobility Admission Current   Walk 10 Feet Assistance Needed: Adaptive equipment, Physical assistance 25% or less  CARE Score - 3 Assistance Needed: Set-up / clean-up, Adaptive equipment    CARE Score - 5     Walk 50 Feet 2 Turns Assistance Needed: Physical assistance 51%-75%  CARE Score - 2 Assistance Needed: Physical assistance    CARE Score - 88   Walk 150 Feet      CARE Score - 88       CARE Score - 88   Walk 10 Feet Uneven      CARE Score - 88       CARE Score - 88   1 Step (Curb) Assistance Needed: Physical assistance, Adaptive equipment 25% or less  CARE Score - 88  Assistance Needed: Physical assistance, Adaptive equipment    CARE Score - 3   4 Steps      CARE Score - 88       CARE Score - 88   12 Steps      CARE Score - 88       CARE Score - 88   Picking Up Object      CARE Score - 10       CARE Score - 88   Wheelchair/Scooter Use        Wheel 50 Feet 2 Turns Assistance Needed: Independent      CARE Score - Wheel 50 Feet with Two Turns: 6    Type of Wheelchair/Scooter: Manual Assistance Needed: Independent      CARE Score - Wheel 50 Feet with Two Turns: 6    Type of Wheelchair/Scooter: Manual   Wheel 150 Feet Assistance Needed: Physical assistance 26%-50%    CARE Score - Wheel 150 Feet: 3    Type of Wheelchair/Scooter: Manual  Assistance Needed: Independent      CARE Score - Wheel 150 Feet: 6    Type of Wheelchair/Scooter: Manual

## 2023-04-24 NOTE — Unmapped (Signed)
 Patient is alert and oriented x 4 , walks with walker with one assist , continent x 2 , tube feeds in progress , medicated for pain x 2 , safety and fall precautions maintained , call bell and personnel belongings are at ned side , bed in lowest position, sister at bed side .          Problem: Rehabilitation (IRF) Plan of Care  Goal: Plan of Care Review  Outcome: Progressing  Goal: Patient-Specific Goal (Individualized)  Outcome: Progressing  Goal: Absence of New-Onset Illness or Injury  Outcome: Progressing  Intervention: Prevent Fall and Fall Injury  Recent Flowsheet Documentation  Taken 04/24/2023 0200 by Orland Dec, RN  Safety Interventions: fall reduction program maintained  Taken 04/24/2023 0000 by Orland Dec, RN  Safety Interventions: fall reduction program maintained  Taken 04/23/2023 2200 by Orland Dec, RN  Safety Interventions: fall reduction program maintained  Taken 04/23/2023 2000 by Orland Dec, RN  Safety Interventions: fall reduction program maintained  Intervention: Prevent Infection  Recent Flowsheet Documentation  Taken 04/23/2023 2000 by Orland Dec, RN  Infection Prevention: hand hygiene promoted  Intervention: Prevent VTE (Venous Thromboembolism)  Recent Flowsheet Documentation  Taken 04/23/2023 2245 by Orland Dec, RN  VTE Prevention/Management: anticoagulant therapy  Goal: Optimal Comfort and Wellbeing  Outcome: Progressing  Goal: Home and Community Transition Plan Established  Outcome: Progressing  Goal: Rounds/Family Conference  Outcome: Progressing     Problem: Latex Allergy  Goal: Absence of Allergy Symptoms  Outcome: Progressing     Problem: Self-Care Deficit  Goal: Improved Ability to Complete Activities of Daily Living  Outcome: Progressing     Problem: Fall Injury Risk  Goal: Absence of Fall and Fall-Related Injury  Outcome: Progressing  Intervention: Promote Injury-Free Environment  Recent Flowsheet Documentation  Taken 04/24/2023 0200 by Orland Dec, RN  Safety Interventions: fall reduction program maintained  Taken 04/24/2023 0000 by Orland Dec, RN  Safety Interventions: fall reduction program maintained  Taken 04/23/2023 2200 by Orland Dec, RN  Safety Interventions: fall reduction program maintained  Taken 04/23/2023 2000 by Orland Dec, RN  Safety Interventions: fall reduction program maintained     Problem: Malnutrition  Goal: Improved Nutritional Intake  Outcome: Progressing

## 2023-04-24 NOTE — Unmapped (Signed)
ULTRASOUND PIV PROCEDURE NOTE    Indications:   Poor venous access.    Ultrasound guidance was necessary to obtain access.     Procedure Details:  Identity of the patient was confirmed via name, medical record number and date of birth. The availability of the correct equipment was verified.    The vein was identified and measured for ultrasound catheter insertion.       Vein measurement (without tourniquet):   0.27 cm   A(n) 22 gauge 1.75 catheter was selected based on the recommendations below:    [facility] Catheter/Vein Ratio Guidelines    Chart to determine PIV catheter size/length to use based on vein diameter and depth   Catheter Gauge Size (g)  22g 20g 18g   Catheter length (inches)  1.75 1.75 1.75   Catheter diameter measurement (mm) 0.9 mm 1.1 mm 1.3 mm          Minimum required vein diameter       Sonosite (cm)  0.27 cm 0.33 cm 0.39 cm          Maximum vein depth  1.25 cm 1.25 cm 1.25 cm          The field was prepared with necessary supplies and equipment.  Probe cover and sterile gel utilized. Insertion site was prepped with chlorhexidineand allowed to dry.  The catheter extension was primed with normal saline.  The [name] PIV was placed in the L Forearm with 1attempt(s). See LDA for additional details.    Catheter aspirated, 3 mL blood return present. The catheter was then flushed with 10 mL of normal saline. Insertion site cleansed, and dressing applied per manufacturer guidelines. The catheter was inserted with difficulty due to poor vasculature by Christie Nottingham, RN.    Thank you,     Christie Nottingham, RN    Ultrasound Resource Nurse    Workup / Procedure Time:  30 minutes

## 2023-04-25 MED ADMIN — promethazine (PHENERGAN) 12.5 mg in sodium chloride 0.9 % 50 mL IVPB (premix): 12.5 mg | INTRAVENOUS | @ 23:00:00

## 2023-04-25 MED ADMIN — acetaminophen (TYLENOL) oral liquid: 1000 mg | ENTERAL | @ 12:00:00

## 2023-04-25 MED ADMIN — simethicone (MYLICON) chewable tablet 80 mg: 80 mg | ORAL | @ 23:00:00

## 2023-04-25 MED ADMIN — HYDROmorphone (PF) (DILAUDID) injection 1 mg: 1 mg | INTRAVENOUS | @ 16:00:00 | Stop: 2023-05-05

## 2023-04-25 MED ADMIN — cefdinir (OMNICEF) oral suspension: 300 mg | ORAL | @ 13:00:00 | Stop: 2023-05-06

## 2023-04-25 MED ADMIN — sodium chloride (NS) 0.9 % flush 10 mL: 10 mL | INTRAVENOUS | @ 03:00:00

## 2023-04-25 MED ADMIN — lidocaine (ASPERCREME) 4 % 1 patch: 1 | TRANSDERMAL | @ 13:00:00

## 2023-04-25 MED ADMIN — famotidine (PEPCID) oral suspension: 20 mg | ENTERAL | @ 02:00:00

## 2023-04-25 MED ADMIN — escitalopram oxalate (LEXAPRO) tablet 5 mg: 5 mg | ENTERAL | @ 13:00:00

## 2023-04-25 MED ADMIN — simethicone (MYLICON) chewable tablet 80 mg: 80 mg | ORAL | @ 17:00:00

## 2023-04-25 MED ADMIN — baclofen (LIORESAL) tablet 5 mg: 5 mg | ORAL | @ 02:00:00

## 2023-04-25 MED ADMIN — apixaban (ELIQUIS) tablet 5 mg: 5 mg | ORAL | @ 02:00:00

## 2023-04-25 MED ADMIN — psyllium (METAMUCIL) 3.4 gram packet 1 packet: 1 | ORAL | @ 02:00:00

## 2023-04-25 MED ADMIN — apixaban (ELIQUIS) tablet 5 mg: 5 mg | ORAL | @ 13:00:00

## 2023-04-25 MED ADMIN — psyllium (METAMUCIL) 3.4 gram packet 1 packet: 1 | ORAL | @ 19:00:00

## 2023-04-25 MED ADMIN — oxyCODONE (ROXICODONE) 5 mg/5 mL solution 20 mg: 20 mg | ORAL | @ 01:00:00 | Stop: 2023-05-05

## 2023-04-25 MED ADMIN — HYDROmorphone (PF) (DILAUDID) injection 1 mg: 1 mg | INTRAVENOUS | @ 03:00:00 | Stop: 2023-05-05

## 2023-04-25 MED ADMIN — HYDROmorphone (PF) (DILAUDID) injection 1 mg: 1 mg | INTRAVENOUS | @ 22:00:00 | Stop: 2023-05-05

## 2023-04-25 MED ADMIN — HYDROmorphone (PF) (DILAUDID) injection 1 mg: 1 mg | INTRAVENOUS | @ 10:00:00 | Stop: 2023-05-05

## 2023-04-25 MED ADMIN — sodium chloride (NS) 0.9 % flush 10 mL: 10 mL | INTRAVENOUS | @ 12:00:00

## 2023-04-25 MED ADMIN — psyllium (METAMUCIL) 3.4 gram packet 1 packet: 1 | ORAL | @ 13:00:00

## 2023-04-25 MED ADMIN — ondansetron (ZOFRAN) injection 8 mg: 8 mg | INTRAVENOUS | @ 01:00:00

## 2023-04-25 MED ADMIN — oxyCODONE (ROXICODONE) 5 mg/5 mL solution 20 mg: 20 mg | ORAL | @ 17:00:00 | Stop: 2023-05-05

## 2023-04-25 MED ADMIN — scopolamine (TRANSDERM-SCOP) 1 mg over 3 days topical patch 1 mg: 1 | TOPICAL | @ 14:00:00

## 2023-04-25 MED ADMIN — pantoprazole (Protonix) injection 40 mg: 40 mg | INTRAVENOUS | @ 02:00:00

## 2023-04-25 MED ADMIN — ondansetron (ZOFRAN) injection 8 mg: 8 mg | INTRAVENOUS | @ 10:00:00

## 2023-04-25 MED ADMIN — baclofen (LIORESAL) tablet 5 mg: 5 mg | ORAL | @ 13:00:00

## 2023-04-25 MED ADMIN — oxyCODONE (ROXICODONE) 5 mg/5 mL solution 20 mg: 20 mg | ORAL | @ 04:00:00 | Stop: 2023-05-05

## 2023-04-25 MED ADMIN — oxyCODONE (ROXICODONE) 5 mg/5 mL solution 20 mg: 20 mg | ORAL | @ 12:00:00 | Stop: 2023-05-05

## 2023-04-25 MED ADMIN — sodium chloride (NS) 0.9 % flush 10 mL: 10 mL | INTRAVENOUS | @ 19:00:00

## 2023-04-25 MED ADMIN — promethazine (PHENERGAN) 12.5 mg in sodium chloride 0.9 % 50 mL IVPB (premix): 12.5 mg | INTRAVENOUS | @ 17:00:00

## 2023-04-25 MED ADMIN — pantoprazole (Protonix) injection 40 mg: 40 mg | INTRAVENOUS | @ 14:00:00

## 2023-04-25 MED ADMIN — acetaminophen (TYLENOL) oral liquid: 1000 mg | ENTERAL | @ 02:00:00

## 2023-04-25 MED ADMIN — oxyCODONE (ROXICODONE) 5 mg/5 mL solution 20 mg: 20 mg | ORAL | @ 23:00:00 | Stop: 2023-05-05

## 2023-04-25 MED ADMIN — acetaminophen (TYLENOL) oral liquid: 1000 mg | ENTERAL | @ 19:00:00

## 2023-04-25 MED ADMIN — ondansetron (ZOFRAN) injection 8 mg: 8 mg | INTRAVENOUS | @ 23:00:00

## 2023-04-25 MED ADMIN — oxyCODONE (ROXICODONE) 5 mg/5 mL solution 20 mg: 20 mg | ORAL | @ 08:00:00 | Stop: 2023-05-05

## 2023-04-25 MED ADMIN — baclofen (LIORESAL) tablet 5 mg: 5 mg | ORAL | @ 19:00:00

## 2023-04-25 NOTE — Unmapped (Signed)
 Physical Medicine and Rehabilitation  Daily Progress Note [facility]    ASSESSMENT:     [name] [name] is a 23 y.o. female 23 y.o. female with PMH of [name] s/p proctocolectomy and J-pouch, desmoid tumours, chronic abdominal pain and nausea, anxiety, PTSD related to past medical care, DVT RUE s/p Eliquis x3 months admitted for constellation of worsening pain, intolerance of oral intake. She is now admitted to [facility] for comprehensive interdisciplinary rehabilitation.      Rehab Impairment Group Code [facility]):   (Debility) 16 Debility (Non-Cardiac/Non-Pulmonary)   Etiology: worsening pain, intolerance of oral intake     PLAN:     This patient is admitted to the Physical Medicine and Rehabilitation - Inpatient - A service from 8am-5pm on weekdays for questions regarding this patient. After hours, weekends, and holidays please contact the 1st Call resident pager     REHAB:   - PT and OT to maximize functional status with mobility and ADLs as well as prevention of joint contracture.   - P&O for assistive devices PRN.  - To be discussed in weekly Interdisciplinary Team Conference.     Acute on chronic abdominal pain and nausea  Complex situation discussed at length in prior notes. In brief, her pain and nausea are (deeply) multifactorial but consensus among treatment team is that new / unknown organic issue is unlikely. Plan to focus on symptom control, adequate nutrition.  Patient is currently at goal tube feeding, tolerating well.  No visible emesis noted.  Still reports persistent nausea but on aggressive nausea management as below. Chronic pain service consulted, appreciated.  - NO CHANGES to pain regimen without multiparty discussion including patient, unless true emergency.  Seen by chronic pain 1/28, recommended to continue the pain regimen as below no changes made.  - Pain management:  -- oxycodone (liquid) 20 mg q4 prn-first-line for pain  -- changed hydromorphone 1 mg IV q6prn -second line for pain  (ONLY IF inadequate relief 1 hour after oral alternative)  -- buprenorphine 20 mcg/h patch   -- hyoscyamine prn  -- Baclofen 5mg  TID  - Nausea: discuss about transitioning one of the IV nausea medication to p.o.  -- ondansetron IV q8 PRN  -- promethazine IV q6   -- scopolamine patch  -- olanzapine 5 mg in evening  -- no diphenhydramine IV in absence of clear indication  - Dyspepsia:  -- famotidine bid  -- PPI IV q12  - GI motility:  -- naloxegol prn  -- Simethicone 3 times daily  -- okay to use loperamide sparingly     Left arm PICC line site swelling, pain and ecchymosis - DVT  Left arm PICC line site noted mild swelling and mild ecchymosis from insertion.  No visible swelling noted below the elbow.  PVL confirmed acute DVT, started on anticoagulation and removed PICC line on 1/28, given acute DVT also patient is currently not on any IV antibiotic. Removed PICC line 1/28,  swelling around prior picc line site improving, mild redness/ rash  due to tape, still has bruises but resolving.   - Eliquis 5mg  BID; will need 3 months of therapy  - Stopped lovenox     Inadequate oral intake  Abnormal gut anatomy notwithstanding, it is the consensus among hospitalist and GI services that oral and/or enteral feeding should be sufficient to meet caloric and hydration needs. Has not developed refeeding syndrome. TPN will not be offered in forseeable future.  - cyclic feeding with Nutren 2.0, at goal rate  50 cc/h for 16 hours with free water flushes every 3 hours of 75cc.   - Calorie count per nutrition  [ ]  GI to follow and manage wean from enteral nutrition at discharge     Elevated transaminases  Improving: Labs with AST 21, ALT 70, downtrending from prior. No evidence of cholestasis or synthetic dysfunction. Presume drug-related. No longer following closely as trend is well established.     Pouchitis (dx 03/2023), resolving  No plan for repeat endoscopy or colonoscopy during this admission, per GI, given recent exam. No significant decrement in Hb so far. s/p Cefdinir 300 mg bid for 4 weeks (end = 04/07/2023)  - Cefdinir 300 mg daily (end = 05/05/2023)  - CBC weekly     Desmoid tumors  - okay to hold nirogesat while having difficulty with oral meds, no urgency to resume per Dr [name]     Mood / anxiety  - continue escitalopram  - lorazepam 1 mg bid prn (home medication)     Daily Checklist  - Diet: Regular diet with cyclic tube feeds  - DVT PPX: eliquis  - GI PPX: IV protonix BID  - Access: piv    DISPO: Patient to be discussed at weekly interdisciplinary team conference.   - EDD: 2/19  - Follow-up: PCP, PM&R, GI, Oncology    SUBJECTIVE:     Weekend Events:  2/15 - NAEON. Patient seen this morning and noted she has not been sleeping well the past two nights due to increased bowel movements. No BM recorded in the chart. Patient noted she is worried about her GI bleeding, hemoglobin stable at 11.7. Will continue to monitor. Nursing noted tube feeds started late. No changes made to plan for today.     OBJECTIVE:     Vital signs (last 24 hours):  Temp:  [36.6 ??C (97.9 ??F)] 36.6 ??C (97.9 ??F)  Heart Rate:  [83] 83  Resp:  [16] 16  BP: (108)/(67) 108/67  MAP (mmHg):  [80] 80  SpO2:  [94 %] 94 %    Intake/Output (last 3 shifts):  I/O last 3 completed shifts:  In: 20 [I.V.:20]  Out: 0     Physical Exam:   GEN: sitting up in bed in no acute distress  EYES: sclera anicteric, conjunctiva clear   HEENT: MMM, NG tube in place  NECK: trachea midline  CV: warm extremities, well perfused, no cyanosis   RESP:  NWOB on RA  ABD: Not distended  SKIN: no visible masses, lesions, rashes, ecchymoses.   MSK: no notable contractures or ext swelling  NEURO: Alert, MAE, speech fluid and coherent, responds appropriately to questions   PSYCH: mood euthymic, affect appropriate, thought process logical         Medications:  Scheduled    acetaminophen (TYLENOL) oral liquid Q8H SCH    ammonium lactate (LAC-HYDRIN) 12 % lotion 1 Application BID    apixaban (ELIQUIS) tablet 5 mg BID    baclofen (LIORESAL) tablet 5 mg TID    buprenorphine 20 mcg/hour transdermal patch 1 patch Weekly    cefdinir (OMNICEF) oral suspension Daily    escitalopram oxalate (LEXAPRO) tablet 5 mg Daily    famotidine (PEPCID) oral suspension Nightly    fluticasone propionate (FLONASE) 50 mcg/actuation nasal spray 1 spray Weekly    lidocaine (ASPERCREME) 4 % 1 patch Daily    multivitamin with folic acid 400 mcg tablet 1 tablet Daily    OLANZapine zydis (ZYPREXA) disintegrating tablet 5 mg Q PM  pantoprazole (Protonix) injection 40 mg BID    promethazine (PHENERGAN) 12.5 mg in sodium chloride 0.9 % 50 mL IVPB (premix) Q6H    psyllium (METAMUCIL) 3.4 gram packet 1 packet TID    scopolamine (TRANSDERM-SCOP) 1 mg over 3 days topical patch 1 mg Q72H    simethicone (MYLICON) chewable tablet 80 mg TID    sodium chloride (NS) 0.9 % flush 10 mL Q8H     PRN cetirizine, 20 mg, Daily PRN  hydrocortisone, , BID PRN  hydrocortisone, , BID PRN  HYDROmorphone, 1 mg, Q6H PRN  hyoscyamine, 125 mcg, Q4H PRN  loperamide, 2 mg, QID PRN  LORazepam, 1 mg, BID PRN  melatonin, 3 mg, Nightly PRN  naloxegol, 12.5 mg, Daily PRN  ondansetron, 8 mg, Q8H PRN  oxyCODONE, 20 mg, Q4H PRN  zinc oxide-cod liver oil, , Daily PRN          Labs/Studies: Reviewed     Radiology Results:   No results found.          Quality Indicators      Hearing, Speech, and Vision  Ability to Hear: Adequate  Ability to See in Adequate Light: Adequate  Expression of Ideas and Wants: Without difficulty  Understanding Verbal and Non-Verbal Content: Understands    Cognitive Pattern Assessment  Cognitive Pattern Assessment Used: BIMS  Brief Interview for Mental Status (BIMS)  Repetition of Three Words (First Attempt): 3  Temporal Orientation: Year: Correct  Temporal Orientation: Month: Accurate within 5 days  Temporal Orientation: Day: Correct  Recall: Sock: Yes, no cue required  Recall: Blue: Yes, no cue required  Recall: Bed: Yes, no cue required  BIMS Summary Score: 15       Nutritional Approaches  Nutritional Approach: Feeding tube    ADLs  Admission Current   Eating Assistance Needed: Set-up / clean-up    CARE Score - 5 Assistance Needed: Independent    CARE Score - 6   Oral Hygiene  Assistance Needed: Physical assistance Total assistance  CARE Score - 1  Assistance Needed: Independent    CARE Score - 6   Bladder Continence       Bowel Continence       Toileting Hygiene Assistance Needed: Incidental touching    CARE Score - 4 Assistance Needed: Incidental touching    CARE Score - 4    Toilet Transfer Assistance Needed: Physical assistance 25% or less  CARE Score - 3 Assistance Needed: Incidental touching    CARE Score - 4   Shower/Bathe Self Assistance Needed: Set-up / clean-up    CARE Score - 88  Assistance Needed: Set-up / clean-up    CARE Score - 5   Upper Body Dressing Assistance Needed: Physical assistance 26%-50%  CARE Score - 3  Assistance Needed: Independent    CARE Score - 6   Lower Body Dressing Assistance Needed: Physical assistance 76% or more  CARE Score - 2  Assistance Needed: Incidental touching    CARE Score - 4   On/Off Footwear Assistance Needed: Physical assistance Total assistance  CARE Score - 1  Assistance Needed: Independent    CARE Score - 6         Transfers Admission Current   Bed to Chair Assistance Needed: Incidental touching    CARE Score - 4  Assistance Needed: Set-up / clean-up    CARE Score - 5     Lying to Sitting Assistance Needed: Physical assistance 26%-50%  CARE Score - 3  Assistance Needed:  Adaptive equipment, Independent    CARE Score - 6   Roll Left/Right Assistance Needed: Physical assistance 25% or less  CARE Score - 3  Assistance Needed: Independent, Adaptive equipment    CARE Score - 6   Sit to Lying Assistance Needed: Physical assistance 26%-50%  CARE Score - 3  Assistance Needed: Independent, Adaptive equipment    CARE Score - 6   Sit to Stand Assistance Needed: Incidental touching    CARE Score - 4  Assistance Needed: Set-up / clean-up, Adaptive equipment    CARE Score - 5         Mobility Admission Current   Walk 10 Feet Assistance Needed: Adaptive equipment, Physical assistance 25% or less  CARE Score - 3 Assistance Needed: Set-up / clean-up, Adaptive equipment    CARE Score - 5     Walk 50 Feet 2 Turns Assistance Needed: Physical assistance 51%-75%  CARE Score - 2 Assistance Needed: Physical assistance    CARE Score - 88   Walk 150 Feet      CARE Score - 88       CARE Score - 88   Walk 10 Feet Uneven      CARE Score - 88       CARE Score - 88   1 Step (Curb) Assistance Needed: Physical assistance, Adaptive equipment 25% or less  CARE Score - 88  Assistance Needed: Physical assistance, Adaptive equipment    CARE Score - 3   4 Steps      CARE Score - 88       CARE Score - 88   12 Steps      CARE Score - 88       CARE Score - 88   Picking Up Object      CARE Score - 10       CARE Score - 88   Wheelchair/Scooter Use        Wheel 50 Feet 2 Turns Assistance Needed: Independent      CARE Score - Wheel 50 Feet with Two Turns: 6    Type of Wheelchair/Scooter: Manual Assistance Needed: Independent      CARE Score - Wheel 50 Feet with Two Turns: 6    Type of Wheelchair/Scooter: Manual   Wheel 150 Feet Assistance Needed: Physical assistance 26%-50%    CARE Score - Wheel 150 Feet: 3    Type of Wheelchair/Scooter: Manual  Assistance Needed: Independent      CARE Score - Wheel 150 Feet: 6    Type of Wheelchair/Scooter: Manual

## 2023-04-25 NOTE — Unmapped (Addendum)
 Received pt lying comfortably in bed, awake, alert and oriented x 4. With boyfriend inside the room. No distress noted. Ngt on left nostril patent. Cont to bowel and bladder. Due meds given. PRN Oxy and dilaudid given to control pain. Skin injury prevention and fall precaution observed. Needs attended. Made pt comfortable. Pt refused for standing daily wt, due to pt verbalized that she was not feeling well and too weak to stand.    Plan of care continued.     Problem: Rehabilitation (IRF) Plan of Care  Goal: Plan of Care Review  Outcome: Progressing  Flowsheets (Taken 04/25/2023 1545)  Progress: improving  Plan of Care Reviewed With: patient  Goal: Patient-Specific Goal (Individualized)  Outcome: Progressing  Goal: Absence of New-Onset Illness or Injury  Outcome: Progressing  Intervention: Prevent Fall and Fall Injury  Recent Flowsheet Documentation  Taken 04/25/2023 1400 by Hale Drone, RN  Safety Interventions:   fall reduction program maintained   low bed  Taken 04/25/2023 1200 by Hale Drone, RN  Safety Interventions:   fall reduction program maintained   low bed   family at bedside  Taken 04/25/2023 1000 by Hale Drone, RN  Safety Interventions:   fall reduction program maintained   low bed  Taken 04/25/2023 0800 by Hale Drone, RN  Safety Interventions:   fall reduction program maintained   low bed  Intervention: Prevent Skin Injury  Recent Flowsheet Documentation  Taken 04/25/2023 1400 by Hale Drone, RN  Skin Protection: incontinence pads utilized  Taken 04/25/2023 1200 by Hale Drone, RN  Device Skin Pressure Protection: absorbent pad utilized/changed  Skin Protection: incontinence pads utilized  Taken 04/25/2023 1000 by Hale Drone, RN  Device Skin Pressure Protection: absorbent pad utilized/changed  Skin Protection: incontinence pads utilized  Taken 04/25/2023 0800 by Hale Drone, RN  Device Skin Pressure Protection: absorbent pad utilized/changed  Skin Protection: incontinence pads utilized  Intervention: Prevent Infection  Recent Flowsheet Documentation  Taken 04/25/2023 1400 by Hale Drone, RN  Infection Prevention:   hand hygiene promoted   rest/sleep promoted   single patient room provided  Taken 04/25/2023 1200 by Hale Drone, RN  Infection Prevention:   hand hygiene promoted   rest/sleep promoted   single patient room provided  Taken 04/25/2023 1000 by Hale Drone, RN  Infection Prevention:   hand hygiene promoted   rest/sleep promoted   single patient room provided  Taken 04/25/2023 0800 by Hale Drone, RN  Infection Prevention:   hand hygiene promoted   rest/sleep promoted   single patient room provided  Goal: Optimal Comfort and Wellbeing  Outcome: Progressing  Goal: Home and Community Transition Plan Established  Outcome: Progressing  Goal: Rounds/Family Conference  Outcome: Progressing     Problem: Latex Allergy  Goal: Absence of Allergy Symptoms  Outcome: Progressing  Intervention: Maintain Latex-Aware Environment  Recent Flowsheet Documentation  Taken 04/25/2023 1000 by Hale Drone, RN  Latex Precautions: latex precautions maintained  Taken 04/25/2023 0800 by Hale Drone, RN  Latex Precautions: latex precautions maintained     Problem: Fall Injury Risk  Goal: Absence of Fall and Fall-Related Injury  Outcome: Progressing  Intervention: Promote Injury-Free Environment  Recent Flowsheet Documentation  Taken 04/25/2023 1400 by Hale Drone, RN  Safety Interventions:   fall reduction program maintained   low bed  Taken 04/25/2023 1200 by Hale Drone, RN  Safety Interventions:   fall reduction program maintained   low  bed   family at bedside  Taken 04/25/2023 1000 by Hale Drone, RN  Safety Interventions:   fall reduction program maintained   low bed  Taken 04/25/2023 0800 by Hale Drone, RN  Safety Interventions:   fall reduction program maintained   low bed     Problem: Malnutrition  Goal: Improved Nutritional Intake  Outcome: Progressing

## 2023-04-25 NOTE — Unmapped (Addendum)
 A&Ox4. She complains of moderate to severe abdominal pain being managed PRN with narcotic analgesics.  She has 2 PIVs; both flushes good, intact, and clean. She refused her IV Phenergan tonight and instead requested for Zofran. She tolerated her Cyclic TF and currently runs at a goal rate of 50 ml/hr. She ambulates with a rollator with assist. She's continent x 2. Plan of care ongoing. Safety measures are maintained.   Problem: Rehabilitation (IRF) Plan of Care  Goal: Plan of Care Review  Outcome: Ongoing - Unchanged  Goal: Patient-Specific Goal (Individualized)  Outcome: Ongoing - Unchanged  Goal: Absence of New-Onset Illness or Injury  Outcome: Ongoing - Unchanged  Intervention: Prevent Fall and Fall Injury  Recent Flowsheet Documentation  Taken 04/25/2023 0200 by Tana Conch, RN  Safety Interventions: fall reduction program maintained  Taken 04/25/2023 0000 by Tana Conch, RN  Safety Interventions: fall reduction program maintained  Taken 04/24/2023 2200 by Tana Conch, RN  Safety Interventions: fall reduction program maintained  Taken 04/24/2023 2000 by Tana Conch, RN  Safety Interventions: fall reduction program maintained  Intervention: Prevent Skin Injury  Recent Flowsheet Documentation  Taken 04/24/2023 2000 by Tana Conch, RN  Skin Protection: incontinence pads utilized  Intervention: Prevent Infection  Recent Flowsheet Documentation  Taken 04/24/2023 2000 by Tana Conch, RN  Infection Prevention: rest/sleep promoted  Goal: Optimal Comfort and Wellbeing  Outcome: Ongoing - Unchanged  Goal: Home and Community Transition Plan Established  Outcome: Ongoing - Unchanged  Goal: Rounds/Family Conference  Outcome: Ongoing - Unchanged     Problem: Latex Allergy  Goal: Absence of Allergy Symptoms  Outcome: Ongoing - Unchanged     Problem: Self-Care Deficit  Goal: Improved Ability to Complete Activities of Daily Living  Outcome: Ongoing - Unchanged     Problem: Fall Injury Risk  Goal: Absence of Fall and Fall-Related Injury  Outcome: Ongoing - Unchanged  Intervention: Promote Injury-Free Environment  Recent Flowsheet Documentation  Taken 04/25/2023 0200 by Tana Conch, RN  Safety Interventions: fall reduction program maintained  Taken 04/25/2023 0000 by Tana Conch, RN  Safety Interventions: fall reduction program maintained  Taken 04/24/2023 2200 by Tana Conch, RN  Safety Interventions: fall reduction program maintained  Taken 04/24/2023 2000 by Tana Conch, RN  Safety Interventions: fall reduction program maintained     Problem: Malnutrition  Goal: Improved Nutritional Intake  Outcome: Ongoing - Unchanged

## 2023-04-26 MED ADMIN — HYDROmorphone (PF) (DILAUDID) injection 1 mg: 1 mg | INTRAVENOUS | @ 05:00:00 | Stop: 2023-05-05

## 2023-04-26 MED ADMIN — escitalopram oxalate (LEXAPRO) tablet 5 mg: 5 mg | ENTERAL | @ 14:00:00

## 2023-04-26 MED ADMIN — psyllium (METAMUCIL) 3.4 gram packet 1 packet: 1 | ORAL | @ 02:00:00

## 2023-04-26 MED ADMIN — acetaminophen (TYLENOL) oral liquid: 1000 mg | ENTERAL | @ 11:00:00

## 2023-04-26 MED ADMIN — sodium chloride (NS) 0.9 % flush 10 mL: 10 mL | INTRAVENOUS | @ 19:00:00

## 2023-04-26 MED ADMIN — oxyCODONE (ROXICODONE) 5 mg/5 mL solution 20 mg: 20 mg | ORAL | @ 15:00:00 | Stop: 2023-05-05

## 2023-04-26 MED ADMIN — apixaban (ELIQUIS) tablet 5 mg: 5 mg | ORAL | @ 15:00:00

## 2023-04-26 MED ADMIN — HYDROmorphone (PF) (DILAUDID) injection 1 mg: 1 mg | INTRAVENOUS | @ 12:00:00 | Stop: 2023-05-05

## 2023-04-26 MED ADMIN — oxyCODONE (ROXICODONE) 5 mg/5 mL solution 20 mg: 20 mg | ORAL | @ 20:00:00 | Stop: 2023-05-05

## 2023-04-26 MED ADMIN — simethicone (MYLICON) chewable tablet 80 mg: 80 mg | ORAL

## 2023-04-26 MED ADMIN — sodium chloride (NS) 0.9 % flush 10 mL: 10 mL | INTRAVENOUS | @ 02:00:00

## 2023-04-26 MED ADMIN — ondansetron (ZOFRAN) injection 8 mg: 8 mg | INTRAVENOUS | @ 12:00:00

## 2023-04-26 MED ADMIN — acetaminophen (TYLENOL) oral liquid: 1000 mg | ENTERAL | @ 19:00:00

## 2023-04-26 MED ADMIN — sodium chloride (NS) 0.9 % flush 10 mL: 10 mL | INTRAVENOUS | @ 11:00:00

## 2023-04-26 MED ADMIN — baclofen (LIORESAL) tablet 5 mg: 5 mg | ORAL | @ 19:00:00

## 2023-04-26 MED ADMIN — baclofen (LIORESAL) tablet 5 mg: 5 mg | ORAL | @ 14:00:00

## 2023-04-26 MED ADMIN — HYDROmorphone (PF) (DILAUDID) injection 1 mg: 1 mg | INTRAVENOUS | @ 18:00:00 | Stop: 2023-05-05

## 2023-04-26 MED ADMIN — acetaminophen (TYLENOL) oral liquid: 1000 mg | ENTERAL | @ 02:00:00

## 2023-04-26 MED ADMIN — promethazine (PHENERGAN) 12.5 mg in sodium chloride 0.9 % 50 mL IVPB (premix): 12.5 mg | INTRAVENOUS | @ 05:00:00

## 2023-04-26 MED ADMIN — cefdinir (OMNICEF) oral suspension: 300 mg | ORAL | @ 14:00:00 | Stop: 2023-05-06

## 2023-04-26 MED ADMIN — psyllium (METAMUCIL) 3.4 gram packet 1 packet: 1 | ORAL | @ 14:00:00

## 2023-04-26 MED ADMIN — LORazepam (ATIVAN) tablet 1 mg: 1 mg | ORAL | @ 22:00:00

## 2023-04-26 MED ADMIN — pantoprazole (Protonix) injection 40 mg: 40 mg | INTRAVENOUS | @ 02:00:00

## 2023-04-26 MED ADMIN — LORazepam (ATIVAN) tablet 1 mg: 1 mg | ORAL | @ 01:00:00

## 2023-04-26 MED ADMIN — lidocaine (ASPERCREME) 4 % 1 patch: 1 | TRANSDERMAL | @ 14:00:00

## 2023-04-26 MED ADMIN — oxyCODONE (ROXICODONE) 5 mg/5 mL solution 20 mg: 20 mg | ORAL | @ 11:00:00 | Stop: 2023-05-05

## 2023-04-26 MED ADMIN — ondansetron (ZOFRAN) injection 8 mg: 8 mg | INTRAVENOUS | @ 20:00:00

## 2023-04-26 MED ADMIN — apixaban (ELIQUIS) tablet 5 mg: 5 mg | ORAL | @ 02:00:00

## 2023-04-26 MED ADMIN — oxyCODONE (ROXICODONE) 5 mg/5 mL solution 20 mg: 20 mg | ORAL | @ 03:00:00 | Stop: 2023-05-05

## 2023-04-26 MED ADMIN — simethicone (MYLICON) chewable tablet 80 mg: 80 mg | ORAL | @ 18:00:00

## 2023-04-26 MED ADMIN — famotidine (PEPCID) oral suspension: 20 mg | ENTERAL | @ 02:00:00

## 2023-04-26 MED ADMIN — baclofen (LIORESAL) tablet 5 mg: 5 mg | ORAL | @ 02:00:00

## 2023-04-26 MED ADMIN — pantoprazole (Protonix) injection 40 mg: 40 mg | INTRAVENOUS | @ 14:00:00

## 2023-04-26 NOTE — Unmapped (Signed)
 A&Ox4. She complains of moderate to severe abdominal pain being managed PRN with narcotic analgesics.  She has 2 PIVs; both flushes good, intact, and clean. She tolerated her Cyclic TF and currently runs at a goal rate of 50 ml/hr. She ambulates with a rollator with assist. She's continent x 2. Plan of care ongoing. Safety measures are maintained    Problem: Rehabilitation (IRF) Plan of Care  Goal: Plan of Care Review  Outcome: Ongoing - Unchanged  Goal: Patient-Specific Goal (Individualized)  Outcome: Ongoing - Unchanged  Goal: Absence of New-Onset Illness or Injury  Outcome: Ongoing - Unchanged  Intervention: Prevent Fall and Fall Injury  Recent Flowsheet Documentation  Taken 04/26/2023 0000 by Tana Conch, RN  Safety Interventions: fall reduction program maintained  Taken 04/25/2023 2200 by Tana Conch, RN  Safety Interventions: fall reduction program maintained  Taken 04/25/2023 2000 by Tana Conch, RN  Safety Interventions: fall reduction program maintained  Intervention: Prevent Skin Injury  Recent Flowsheet Documentation  Taken 04/25/2023 2000 by Tana Conch, RN  Device Skin Pressure Protection: absorbent pad utilized/changed  Skin Protection: incontinence pads utilized  Intervention: Prevent Infection  Recent Flowsheet Documentation  Taken 04/25/2023 2200 by Tana Conch, RN  Infection Prevention: rest/sleep promoted  Taken 04/25/2023 2000 by Tana Conch, RN  Infection Prevention: rest/sleep promoted  Goal: Optimal Comfort and Wellbeing  Outcome: Ongoing - Unchanged  Goal: Home and Community Transition Plan Established  Outcome: Ongoing - Unchanged  Goal: Rounds/Family Conference  Outcome: Ongoing - Unchanged     Problem: Latex Allergy  Goal: Absence of Allergy Symptoms  Outcome: Ongoing - Unchanged     Problem: Self-Care Deficit  Goal: Improved Ability to Complete Activities of Daily Living  Outcome: Ongoing - Unchanged     Problem: Fall Injury Risk  Goal: Absence of Fall and Fall-Related Injury  Outcome: Ongoing - Unchanged  Intervention: Promote Injury-Free Environment  Recent Flowsheet Documentation  Taken 04/26/2023 0000 by Tana Conch, RN  Safety Interventions: fall reduction program maintained  Taken 04/25/2023 2200 by Tana Conch, RN  Safety Interventions: fall reduction program maintained  Taken 04/25/2023 2000 by Tana Conch, RN  Safety Interventions: fall reduction program maintained     Problem: Malnutrition  Goal: Improved Nutritional Intake  Outcome: Ongoing - Unchanged

## 2023-04-26 NOTE — Unmapped (Signed)
 Physical Medicine and Rehabilitation  Daily Progress Note [facility]    ASSESSMENT:     [name] [name] is a 23 y.o. female 23 y.o. female with PMH of [name] s/p proctocolectomy and J-pouch, desmoid tumours, chronic abdominal pain and nausea, anxiety, PTSD related to past medical care, DVT RUE s/p Eliquis x3 months admitted for constellation of worsening pain, intolerance of oral intake. She is now admitted to [facility] for comprehensive interdisciplinary rehabilitation.      Rehab Impairment Group Code [facility]):   (Debility) 16 Debility (Non-Cardiac/Non-Pulmonary)   Etiology: worsening pain, intolerance of oral intake     PLAN:     This patient is admitted to the Physical Medicine and Rehabilitation - Inpatient - A service from 8am-5pm on weekdays for questions regarding this patient. After hours, weekends, and holidays please contact the 1st Call resident pager     REHAB:   - PT and OT to maximize functional status with mobility and ADLs as well as prevention of joint contracture.   - P&O for assistive devices PRN.  - To be discussed in weekly Interdisciplinary Team Conference.     Acute on chronic abdominal pain and nausea  Complex situation discussed at length in prior notes. In brief, her pain and nausea are (deeply) multifactorial but consensus among treatment team is that new / unknown organic issue is unlikely. Plan to focus on symptom control, adequate nutrition.  Patient is currently at goal tube feeding, tolerating well.  No visible emesis noted.  Still reports persistent nausea but on aggressive nausea management as below. Chronic pain service consulted, appreciated.  - NO CHANGES to pain regimen without multiparty discussion including patient, unless true emergency.  Seen by chronic pain 1/28, recommended to continue the pain regimen as below no changes made.  - Pain management:  -- oxycodone (liquid) 20 mg q4 prn-first-line for pain  -- changed hydromorphone 1 mg IV q6prn -second line for pain  (ONLY IF inadequate relief 1 hour after oral alternative)  -- buprenorphine 20 mcg/h patch   -- hyoscyamine prn  -- Baclofen 5mg  TID  - Nausea: discuss about transitioning one of the IV nausea medication to p.o.  -- ondansetron IV q8 PRN  -- promethazine IV q6   -- scopolamine patch  -- olanzapine 5 mg in evening  -- no diphenhydramine IV in absence of clear indication  - Dyspepsia:  -- famotidine bid  -- PPI IV q12  - GI motility:  -- naloxegol prn  -- Simethicone 3 times daily  -- okay to use loperamide sparingly     Left arm PICC line site swelling, pain and ecchymosis - DVT  Left arm PICC line site noted mild swelling and mild ecchymosis from insertion.  No visible swelling noted below the elbow.  PVL confirmed acute DVT, started on anticoagulation and removed PICC line on 1/28, given acute DVT also patient is currently not on any IV antibiotic. Removed PICC line 1/28,  swelling around prior picc line site improving, mild redness/ rash  due to tape, still has bruises but resolving.   - Eliquis 5mg  BID; will need 3 months of therapy  - Stopped lovenox     Inadequate oral intake  Abnormal gut anatomy notwithstanding, it is the consensus among hospitalist and GI services that oral and/or enteral feeding should be sufficient to meet caloric and hydration needs. Has not developed refeeding syndrome. TPN will not be offered in forseeable future.  - cyclic feeding with Nutren 2.0, at goal rate  50 cc/h for 16 hours with free water flushes every 3 hours of 75cc.   - Calorie count per nutrition  [ ]  GI to follow and manage wean from enteral nutrition at discharge     Elevated transaminases  Improving: Labs with AST 21, ALT 70, downtrending from prior. No evidence of cholestasis or synthetic dysfunction. Presume drug-related. No longer following closely as trend is well established.     Pouchitis (dx 03/2023), resolving  No plan for repeat endoscopy or colonoscopy during this admission, per GI, given recent exam. No significant decrement in Hb so far. s/p Cefdinir 300 mg bid for 4 weeks (end = 04/07/2023)  - Cefdinir 300 mg daily (end = 05/05/2023)  - CBC weekly     Desmoid tumors  - okay to hold nirogesat while having difficulty with oral meds, no urgency to resume per Dr [name]     Mood / anxiety  - continue escitalopram  - lorazepam 1 mg bid prn (home medication)     Daily Checklist  - Diet: Regular diet with cyclic tube feeds  - DVT PPX: eliquis  - GI PPX: IV protonix BID  - Access: piv    DISPO: Patient to be discussed at weekly interdisciplinary team conference.   - EDD: 2/19  - Follow-up: PCP, PM&R, GI, Oncology    SUBJECTIVE:     Weekend Events:  2/15 - NAEON. Patient seen this morning and noted she has not been sleeping well the past two nights due to increased bowel movements. No BM recorded in the chart. Patient noted she is worried about her GI bleeding, hemoglobin stable at 11.7. Will continue to monitor. Nursing noted tube feeds started late. No changes made to plan for today.     2/16 - NAEON. Patient declined standing weight and verbalized she was feeling too weak. This morning patient noted continued abdominal pain and overall feeling of fatigue. Did not eat anything throughout the day. Encouraged oral intake and utilizing antiemetics that are currently ordered.     OBJECTIVE:     Vital signs (last 24 hours):  Temp:  [36.5 ??C (97.7 ??F)-36.7 ??C (98.1 ??F)] 36.5 ??C (97.7 ??F)  Heart Rate:  [78-85] 85  Resp:  [18] 18  BP: (110-113)/(56-73) 110/56  MAP (mmHg):  [73-84] 73  SpO2:  [97 %-98 %] 97 %    Intake/Output (last 3 shifts):  I/O last 3 completed shifts:  In: 75 [NG/GT:75]  Out: 0     Physical Exam:   GEN: Sitting up in bed in no acute distress  EYES: sclera anicteric, conjunctiva clear   HEENT: MMM, NG tube in place  NECK: trachea midline  CV: warm extremities, well perfused, no cyanosis   RESP:  NWOB on RA  ABD: Not distended  SKIN: no visible masses, lesions, rashes, ecchymoses.   MSK: no notable contractures or ext swelling  NEURO: Alert, MAE, speech fluid and coherent, responds appropriately to questions   PSYCH: mood euthymic, affect appropriate, thought process logical         Medications:  Scheduled    acetaminophen (TYLENOL) oral liquid Q8H SCH    ammonium lactate (LAC-HYDRIN) 12 % lotion 1 Application BID    apixaban (ELIQUIS) tablet 5 mg BID    baclofen (LIORESAL) tablet 5 mg TID    buprenorphine 20 mcg/hour transdermal patch 1 patch Weekly    cefdinir (OMNICEF) oral suspension Daily    escitalopram oxalate (LEXAPRO) tablet 5 mg Daily    famotidine (PEPCID) oral suspension  Nightly    fluticasone propionate (FLONASE) 50 mcg/actuation nasal spray 1 spray Weekly    lidocaine (ASPERCREME) 4 % 1 patch Daily    multivitamin with folic acid 400 mcg tablet 1 tablet Daily    OLANZapine zydis (ZYPREXA) disintegrating tablet 5 mg Q PM    pantoprazole (Protonix) injection 40 mg BID    promethazine (PHENERGAN) 12.5 mg in sodium chloride 0.9 % 50 mL IVPB (premix) Q6H    psyllium (METAMUCIL) 3.4 gram packet 1 packet TID    scopolamine (TRANSDERM-SCOP) 1 mg over 3 days topical patch 1 mg Q72H    simethicone (MYLICON) chewable tablet 80 mg TID    sodium chloride (NS) 0.9 % flush 10 mL Q8H     PRN cetirizine, 20 mg, Daily PRN  hydrocortisone, , BID PRN  hydrocortisone, , BID PRN  HYDROmorphone, 1 mg, Q6H PRN  hyoscyamine, 125 mcg, Q4H PRN  loperamide, 2 mg, QID PRN  LORazepam, 1 mg, BID PRN  melatonin, 3 mg, Nightly PRN  naloxegol, 12.5 mg, Daily PRN  ondansetron, 8 mg, Q8H PRN  oxyCODONE, 20 mg, Q4H PRN  zinc oxide-cod liver oil, , Daily PRN          Labs/Studies: Reviewed     Radiology Results:   No results found.          Quality Indicators      Hearing, Speech, and Vision  Ability to Hear: Adequate  Ability to See in Adequate Light: Adequate  Expression of Ideas and Wants: Without difficulty  Understanding Verbal and Non-Verbal Content: Understands    Cognitive Pattern Assessment  Cognitive Pattern Assessment Used: BIMS  Brief Interview for Mental Status (BIMS)  Repetition of Three Words (First Attempt): 3  Temporal Orientation: Year: Correct  Temporal Orientation: Month: Accurate within 5 days  Temporal Orientation: Day: Correct  Recall: Sock: Yes, no cue required  Recall: Blue: Yes, no cue required  Recall: Bed: Yes, no cue required  BIMS Summary Score: 15       Nutritional Approaches  Nutritional Approach: Feeding tube    ADLs  Admission Current   Eating Assistance Needed: Set-up / clean-up    CARE Score - 5 Assistance Needed: Independent    CARE Score - 6   Oral Hygiene  Assistance Needed: Physical assistance Total assistance  CARE Score - 1  Assistance Needed: Independent    CARE Score - 6   Bladder Continence       Bowel Continence       Toileting Hygiene Assistance Needed: Incidental touching    CARE Score - 4 Assistance Needed: Incidental touching    CARE Score - 4    Toilet Transfer Assistance Needed: Physical assistance 25% or less  CARE Score - 3 Assistance Needed: Incidental touching    CARE Score - 4   Shower/Bathe Self Assistance Needed: Set-up / clean-up    CARE Score - 88  Assistance Needed: Set-up / clean-up    CARE Score - 5   Upper Body Dressing Assistance Needed: Physical assistance 26%-50%  CARE Score - 3  Assistance Needed: Independent    CARE Score - 6   Lower Body Dressing Assistance Needed: Physical assistance 76% or more  CARE Score - 2  Assistance Needed: Incidental touching    CARE Score - 4   On/Off Footwear Assistance Needed: Physical assistance Total assistance  CARE Score - 1  Assistance Needed: Independent    CARE Score - 6  Transfers Admission Current   Bed to Chair Assistance Needed: Incidental touching    CARE Score - 4  Assistance Needed: Set-up / clean-up    CARE Score - 5     Lying to Sitting Assistance Needed: Physical assistance 26%-50%  CARE Score - 3  Assistance Needed: Adaptive equipment, Independent    CARE Score - 6   Roll Left/Right Assistance Needed: Physical assistance 25% or less  CARE Score - 3  Assistance Needed: Independent, Adaptive equipment    CARE Score - 6   Sit to Lying Assistance Needed: Physical assistance 26%-50%  CARE Score - 3  Assistance Needed: Independent, Adaptive equipment    CARE Score - 6   Sit to Stand Assistance Needed: Incidental touching    CARE Score - 4  Assistance Needed: Set-up / clean-up, Adaptive equipment    CARE Score - 5         Mobility Admission Current   Walk 10 Feet Assistance Needed: Adaptive equipment, Physical assistance 25% or less  CARE Score - 3 Assistance Needed: Set-up / clean-up, Adaptive equipment    CARE Score - 5     Walk 50 Feet 2 Turns Assistance Needed: Physical assistance 51%-75%  CARE Score - 2 Assistance Needed: Physical assistance    CARE Score - 88   Walk 150 Feet      CARE Score - 88       CARE Score - 88   Walk 10 Feet Uneven      CARE Score - 88       CARE Score - 88   1 Step (Curb) Assistance Needed: Physical assistance, Adaptive equipment 25% or less  CARE Score - 88  Assistance Needed: Physical assistance, Adaptive equipment    CARE Score - 3   4 Steps      CARE Score - 88       CARE Score - 88   12 Steps      CARE Score - 88       CARE Score - 88   Picking Up Object      CARE Score - 10       CARE Score - 88   Wheelchair/Scooter Use        Wheel 50 Feet 2 Turns Assistance Needed: Independent      CARE Score - Wheel 50 Feet with Two Turns: 6    Type of Wheelchair/Scooter: Manual Assistance Needed: Independent      CARE Score - Wheel 50 Feet with Two Turns: 6    Type of Wheelchair/Scooter: Manual   Wheel 150 Feet Assistance Needed: Physical assistance 26%-50%    CARE Score - Wheel 150 Feet: 3    Type of Wheelchair/Scooter: Manual  Assistance Needed: Independent      CARE Score - Wheel 150 Feet: 6    Type of Wheelchair/Scooter: Manual

## 2023-04-26 NOTE — Unmapped (Signed)
 Problem: Rehabilitation (IRF) Plan of Care  Goal: Plan of Care Review  Outcome: Progressing  Flowsheets (Taken 04/26/2023 1559)  Progress: improving  Plan of Care Reviewed With: patient  Goal: Patient-Specific Goal (Individualized)  Outcome: Progressing  Flowsheets (Taken 04/26/2023 1559)  Anxieties, Fears or Concerns: patient concerned that TF needs to be decreased  Goal: Absence of New-Onset Illness or Injury  Outcome: Progressing  Intervention: Prevent Fall and Fall Injury  Recent Flowsheet Documentation  Taken 04/26/2023 1400 by Jerelene Redden, RN  Safety Interventions:   environmental modification   fall reduction program maintained   lighting adjusted for tasks/safety   low bed  Taken 04/26/2023 1200 by Jerelene Redden, RN  Safety Interventions:   environmental modification   fall reduction program maintained   lighting adjusted for tasks/safety   low bed  Taken 04/26/2023 1000 by Jerelene Redden, RN  Safety Interventions:   environmental modification   fall reduction program maintained   lighting adjusted for tasks/safety   low bed  Taken 04/26/2023 0800 by Jerelene Redden, RN  Safety Interventions:   environmental modification   fall reduction program maintained   lighting adjusted for tasks/safety   low bed   family at bedside  Taken 04/26/2023 0712 by Jerelene Redden, RN  Safety Interventions:   environmental modification   fall reduction program maintained   family at bedside   lighting adjusted for tasks/safety   low bed  Intervention: Prevent Infection  Recent Flowsheet Documentation  Taken 04/26/2023 1400 by Jerelene Redden, RN  Infection Prevention: environmental surveillance performed  Taken 04/26/2023 1200 by Jerelene Redden, RN  Infection Prevention: environmental surveillance performed  Taken 04/26/2023 1000 by Jerelene Redden, RN  Infection Prevention: environmental surveillance performed  Taken 04/26/2023 0800 by Jerelene Redden, RN  Infection Prevention: environmental surveillance performed  Taken 04/26/2023 0712 by Jerelene Redden, RN  Infection Prevention: environmental surveillance performed  Intervention: Prevent VTE (Venous Thromboembolism)  Recent Flowsheet Documentation  Taken 04/26/2023 1400 by Jerelene Redden, RN  Anti-Embolism Device Status: (eliquis) Other (Comment)  Taken 04/26/2023 1200 by Jerelene Redden, RN  Anti-Embolism Device Status: (eliquis) Other (Comment)  Taken 04/26/2023 1000 by Jerelene Redden, RN  Anti-Embolism Device Status: (eliquis) Other (Comment)  Taken 04/26/2023 0800 by Jerelene Redden, RN  Anti-Embolism Device Status: (eliquis) Other (Comment)  Taken 04/26/2023 0743 by Jerelene Redden, RN  VTE Prevention/Management: anticoagulant therapy  Anti-Embolism Device Status: (eliquis) Other (Comment)  Goal: Optimal Comfort and Wellbeing  Outcome: Progressing  Goal: Home and Community Transition Plan Established  Outcome: Progressing  Goal: Rounds/Family Conference  Outcome: Progressing     Problem: Latex Allergy  Goal: Absence of Allergy Symptoms  Outcome: Progressing     Problem: Self-Care Deficit  Goal: Improved Ability to Complete Activities of Daily Living  Outcome: Progressing     Problem: Fall Injury Risk  Goal: Absence of Fall and Fall-Related Injury  Outcome: Progressing  Intervention: Promote Injury-Free Environment  Recent Flowsheet Documentation  Taken 04/26/2023 1400 by Jerelene Redden, RN  Safety Interventions:   environmental modification   fall reduction program maintained   lighting adjusted for tasks/safety   low bed  Taken 04/26/2023 1200 by Jerelene Redden, RN  Safety Interventions:   environmental modification   fall reduction program maintained   lighting adjusted for tasks/safety   low bed  Taken 04/26/2023 1000 by Jerelene Redden, RN  Safety Interventions:   environmental modification   fall  reduction program maintained   lighting adjusted for tasks/safety   low bed  Taken 04/26/2023 0800 by Jerelene Redden, RN  Safety Interventions:   environmental modification   fall reduction program maintained   lighting adjusted for tasks/safety   low bed   family at bedside  Taken 04/26/2023 0712 by Jerelene Redden, RN  Safety Interventions:   environmental modification   fall reduction program maintained   family at bedside   lighting adjusted for tasks/safety   low bed     Problem: Malnutrition  Goal: Improved Nutritional Intake  Outcome: Progressing

## 2023-04-27 LAB — CBC
HEMATOCRIT: 33.8 % — ABNORMAL LOW (ref 34.0–44.0)
HEMOGLOBIN: 11.2 g/dL — ABNORMAL LOW (ref 11.3–14.9)
MEAN CORPUSCULAR HEMOGLOBIN CONC: 33.3 g/dL (ref 32.0–36.0)
MEAN CORPUSCULAR HEMOGLOBIN: 28.5 pg (ref 25.9–32.4)
MEAN CORPUSCULAR VOLUME: 85.6 fL (ref 77.6–95.7)
MEAN PLATELET VOLUME: 9.9 fL (ref 6.8–10.7)
PLATELET COUNT: 243 10*9/L (ref 150–450)
RED BLOOD CELL COUNT: 3.95 10*12/L (ref 3.95–5.13)
RED CELL DISTRIBUTION WIDTH: 17.1 % — ABNORMAL HIGH (ref 12.2–15.2)
WBC ADJUSTED: 9.3 10*9/L (ref 3.6–11.2)

## 2023-04-27 LAB — BASIC METABOLIC PANEL
ANION GAP: 9 mmol/L (ref 5–14)
BLOOD UREA NITROGEN: 11 mg/dL (ref 9–23)
BUN / CREAT RATIO: 21
CALCIUM: 9.8 mg/dL (ref 8.7–10.4)
CHLORIDE: 103 mmol/L (ref 98–107)
CO2: 29.7 mmol/L (ref 20.0–31.0)
CREATININE: 0.52 mg/dL — ABNORMAL LOW (ref 0.55–1.02)
EGFR CKD-EPI (2021) FEMALE: >90 mL/min/{1.73_m2} (ref >=60)
GLUCOSE RANDOM: 93 mg/dL (ref 70–179)
POTASSIUM: 4.1 mmol/L (ref 3.4–4.8)
SODIUM: 142 mmol/L (ref 135–145)

## 2023-04-27 LAB — PHOSPHORUS: PHOSPHORUS: 4.2 mg/dL (ref 2.4–5.1)

## 2023-04-27 LAB — MAGNESIUM: MAGNESIUM: 1.9 mg/dL (ref 1.6–2.6)

## 2023-04-27 MED ORDER — FAMOTIDINE 40 MG/5 ML (8 MG/ML) ORAL SUSPENSION
Freq: Every evening | ORAL | 2 refills | 20.00 days | Status: CP
Start: 2023-04-27 — End: 2023-04-27

## 2023-04-27 MED ORDER — PSYLLIUM HUSK (ASPARTAME) 3.4 GRAM ORAL POWDER PACKET
PACK | Freq: Three times a day (TID) | ORAL | 0 refills | 30.00 days | Status: CP
Start: 2023-04-27 — End: 2023-05-27

## 2023-04-27 MED ORDER — CEFDINIR 250 MG/5 ML ORAL SUSPENSION
Freq: Every day | ORAL | 0 refills | 10.00 days | Status: CP
Start: 2023-04-27 — End: 2023-05-07

## 2023-04-27 MED ORDER — APIXABAN 5 MG TABLET
ORAL_TABLET | Freq: Two times a day (BID) | ORAL | 0 refills | 64.00 days | Status: CP
Start: 2023-04-27 — End: 2023-06-30

## 2023-04-27 MED ORDER — BACLOFEN 5 MG TABLET
ORAL_TABLET | Freq: Three times a day (TID) | ORAL | 2 refills | 30.00 days | Status: CP
Start: 2023-04-27 — End: ?

## 2023-04-27 MED ORDER — PROMETHAZINE 12.5 MG TABLET
ORAL_TABLET | Freq: Four times a day (QID) | ORAL | 0 refills | 30.00 days | Status: CP | PRN
Start: 2023-04-27 — End: 2023-05-27

## 2023-04-27 MED ORDER — OXYCODONE 5 MG TABLET
ORAL_TABLET | Freq: Three times a day (TID) | ORAL | 0 refills | 5.00 days | Status: CP | PRN
Start: 2023-04-27 — End: 2023-05-02

## 2023-04-27 MED ORDER — SIMETHICONE 80 MG CHEWABLE TABLET
ORAL_TABLET | Freq: Four times a day (QID) | ORAL | 0 refills | 23 days | Status: CP | PRN
Start: 2023-04-27 — End: 2023-05-27

## 2023-04-27 MED ORDER — OXYCODONE 5 MG/5 ML ORAL SOLUTION
Freq: Three times a day (TID) | ORAL | 0 refills | 5.00 days | Status: CP | PRN
Start: 2023-04-27 — End: 2023-04-27

## 2023-04-27 MED ADMIN — ondansetron (ZOFRAN) injection 8 mg: 8 mg | INTRAVENOUS | @ 07:00:00

## 2023-04-27 MED ADMIN — lidocaine (ASPERCREME) 4 % 1 patch: 1 | TRANSDERMAL | @ 14:00:00

## 2023-04-27 MED ADMIN — cefdinir (OMNICEF) oral suspension: 300 mg | ORAL | @ 14:00:00 | Stop: 2023-05-06

## 2023-04-27 MED ADMIN — promethazine (PHENERGAN) tablet 12.5 mg: 12.5 mg | ORAL | @ 15:00:00

## 2023-04-27 MED ADMIN — famotidine (PEPCID) oral suspension: 20 mg | ENTERAL | @ 04:00:00

## 2023-04-27 MED ADMIN — ondansetron (ZOFRAN) injection 8 mg: 8 mg | INTRAVENOUS | @ 21:00:00

## 2023-04-27 MED ADMIN — pantoprazole (Protonix) injection 40 mg: 40 mg | INTRAVENOUS | @ 03:00:00

## 2023-04-27 MED ADMIN — oxyCODONE (ROXICODONE) 5 mg/5 mL solution 20 mg: 20 mg | ORAL | @ 09:00:00 | Stop: 2023-05-05

## 2023-04-27 MED ADMIN — HYDROmorphone (PF) (DILAUDID) injection 1 mg: 1 mg | INTRAVENOUS | @ 14:00:00 | Stop: 2023-05-05

## 2023-04-27 MED ADMIN — simethicone (MYLICON) chewable tablet 80 mg: 80 mg | ORAL | @ 19:00:00

## 2023-04-27 MED ADMIN — sodium chloride (NS) 0.9 % flush 10 mL: 10 mL | INTRAVENOUS | @ 11:00:00

## 2023-04-27 MED ADMIN — sodium chloride (NS) 0.9 % flush 10 mL: 10 mL | INTRAVENOUS | @ 19:00:00

## 2023-04-27 MED ADMIN — baclofen (LIORESAL) tablet 5 mg: 5 mg | ORAL | @ 14:00:00

## 2023-04-27 MED ADMIN — HYDROmorphone (PF) (DILAUDID) injection 1 mg: 1 mg | INTRAVENOUS | Stop: 2023-05-05

## 2023-04-27 MED ADMIN — oxyCODONE (ROXICODONE) 5 mg/5 mL solution 20 mg: 20 mg | ORAL | @ 23:00:00 | Stop: 2023-05-05

## 2023-04-27 MED ADMIN — baclofen (LIORESAL) tablet 5 mg: 5 mg | ORAL | @ 19:00:00

## 2023-04-27 MED ADMIN — oxyCODONE (ROXICODONE) 5 mg/5 mL solution 20 mg: 20 mg | ORAL | @ 19:00:00 | Stop: 2023-05-05

## 2023-04-27 MED ADMIN — psyllium (METAMUCIL) 3.4 gram packet 1 packet: 1 | ORAL | @ 19:00:00

## 2023-04-27 MED ADMIN — oxyCODONE (ROXICODONE) 5 mg/5 mL solution 20 mg: 20 mg | ORAL | @ 15:00:00 | Stop: 2023-05-05

## 2023-04-27 MED ADMIN — oxyCODONE (ROXICODONE) 5 mg/5 mL solution 20 mg: 20 mg | ORAL | @ 01:00:00 | Stop: 2023-05-05

## 2023-04-27 MED ADMIN — apixaban (ELIQUIS) tablet 5 mg: 5 mg | ORAL | @ 03:00:00

## 2023-04-27 MED ADMIN — simethicone (MYLICON) chewable tablet 80 mg: 80 mg | ORAL | @ 23:00:00

## 2023-04-27 MED ADMIN — ammonium lactate (LAC-HYDRIN) 12 % lotion 1 Application: 1 | TOPICAL | @ 14:00:00

## 2023-04-27 MED ADMIN — baclofen (LIORESAL) tablet 5 mg: 5 mg | ORAL | @ 03:00:00

## 2023-04-27 MED ADMIN — pantoprazole (Protonix) injection 40 mg: 40 mg | INTRAVENOUS | @ 14:00:00

## 2023-04-27 MED ADMIN — promethazine (PHENERGAN) tablet 12.5 mg: 12.5 mg | ORAL | @ 23:00:00

## 2023-04-27 MED ADMIN — multivitamin with folic acid 400 mcg tablet 1 tablet: 1 | ORAL | @ 14:00:00

## 2023-04-27 MED ADMIN — apixaban (ELIQUIS) tablet 5 mg: 5 mg | ORAL | @ 14:00:00

## 2023-04-27 MED ADMIN — HYDROmorphone (PF) (DILAUDID) injection 1 mg: 1 mg | INTRAVENOUS | @ 07:00:00 | Stop: 2023-05-05

## 2023-04-27 MED ADMIN — acetaminophen (TYLENOL) oral liquid: 1000 mg | ENTERAL | @ 03:00:00

## 2023-04-27 MED ADMIN — simethicone (MYLICON) chewable tablet 80 mg: 80 mg | ORAL | @ 11:00:00

## 2023-04-27 MED ADMIN — sodium chloride (NS) 0.9 % flush 10 mL: 10 mL | INTRAVENOUS | @ 03:00:00

## 2023-04-27 MED ADMIN — psyllium (METAMUCIL) 3.4 gram packet 1 packet: 1 | ORAL | @ 14:00:00

## 2023-04-27 MED ADMIN — HYDROmorphone (PF) (DILAUDID) injection 1 mg: 1 mg | INTRAVENOUS | @ 21:00:00 | Stop: 2023-05-05

## 2023-04-27 MED ADMIN — acetaminophen (TYLENOL) oral liquid: 1000 mg | ENTERAL | @ 19:00:00

## 2023-04-27 MED ADMIN — escitalopram oxalate (LEXAPRO) tablet 5 mg: 5 mg | ENTERAL | @ 14:00:00

## 2023-04-27 MED ADMIN — acetaminophen (TYLENOL) oral liquid: 1000 mg | ENTERAL | @ 11:00:00

## 2023-04-27 NOTE — Unmapped (Addendum)
 A&OX4. Remains afebrile with VSS. Continent of bowel and bladder. Complaint of abdominal pain managed with prn Dilaudid, and oxycodone. She declined her Phenergan, and instead requested for Zofran. Pt is receiving her cyclic tube feeds at 50 ml/hr, and tolerating well.. Safety precautions maintained. Call bell within reach. Plan of care continued.    1610: Pt refused standing weight stating that she is too weak and that she does not feel safe standing to the scale.  Problem: Rehabilitation (IRF) Plan of Care  Goal: Plan of Care Review  Outcome: Progressing  Flowsheets (Taken 04/27/2023 0527)  Plan of Care Reviewed With: patient  Goal: Patient-Specific Goal (Individualized)  Outcome: Progressing  Goal: Absence of New-Onset Illness or Injury  Outcome: Progressing  Intervention: Prevent Fall and Fall Injury  Recent Flowsheet Documentation  Taken 04/27/2023 0400 by Loyal Jacobson, RN  Safety Interventions: fall reduction program maintained  Taken 04/27/2023 0200 by Loyal Jacobson, RN  Safety Interventions: fall reduction program maintained  Taken 04/27/2023 0000 by Loyal Jacobson, RN  Safety Interventions: fall reduction program maintained  Taken 04/26/2023 2200 by Loyal Jacobson, RN  Safety Interventions: fall reduction program maintained  Taken 04/26/2023 2000 by Loyal Jacobson, RN  Safety Interventions: fall reduction program maintained  Intervention: Prevent Skin Injury  Recent Flowsheet Documentation  Taken 04/26/2023 2000 by Loyal Jacobson, RN  Device Skin Pressure Protection: absorbent pad utilized/changed  Skin Protection: incontinence pads utilized  Intervention: Prevent Infection  Recent Flowsheet Documentation  Taken 04/26/2023 2000 by Loyal Jacobson, RN  Infection Prevention:   hand hygiene promoted   personal protective equipment utilized   environmental surveillance performed  Intervention: Prevent VTE (Venous Thromboembolism)  Recent Flowsheet Documentation  Taken 04/26/2023 2010 by Loyal Jacobson, RN  VTE Prevention/Management:   anticoagulant therapy   ambulation promoted  Anti-Embolism Device Status: (eliquis) Other (Comment)  Goal: Optimal Comfort and Wellbeing  Outcome: Progressing  Goal: Home and Community Transition Plan Established  Outcome: Progressing  Goal: Rounds/Family Conference  Outcome: Progressing     Problem: Latex Allergy  Goal: Absence of Allergy Symptoms  Outcome: Progressing     Problem: Self-Care Deficit  Goal: Improved Ability to Complete Activities of Daily Living  Outcome: Progressing  Intervention: Promote Activity and Functional Independence  Recent Flowsheet Documentation  Taken 04/26/2023 2010 by Loyal Jacobson, RN  Self-Care Promotion: independence encouraged     Problem: Fall Injury Risk  Goal: Absence of Fall and Fall-Related Injury  Outcome: Progressing  Intervention: Identify and Manage Contributors  Recent Flowsheet Documentation  Taken 04/26/2023 2010 by Loyal Jacobson, RN  Self-Care Promotion: independence encouraged  Intervention: Promote Injury-Free Environment  Recent Flowsheet Documentation  Taken 04/27/2023 0400 by Loyal Jacobson, RN  Safety Interventions: fall reduction program maintained  Taken 04/27/2023 0200 by Loyal Jacobson, RN  Safety Interventions: fall reduction program maintained  Taken 04/27/2023 0000 by Loyal Jacobson, RN  Safety Interventions: fall reduction program maintained  Taken 04/26/2023 2200 by Loyal Jacobson, RN  Safety Interventions: fall reduction program maintained  Taken 04/26/2023 2000 by Loyal Jacobson, RN  Safety Interventions: fall reduction program maintained     Problem: Malnutrition  Goal: Improved Nutritional Intake  Outcome: Progressing

## 2023-04-27 NOTE — Unmapped (Signed)
 Patient is alert and oriented. Family at the bedside. Patient with NGT at left nare. Checked placement and secured. Patient with PIV at right hand and LFA. Patient with abdominal pain and nausea, due meds given. Attended all her needs. Bed on low position. Patient had abdominal xray today.     Problem: Rehabilitation (IRF) Plan of Care  Goal: Plan of Care Review  Outcome: Progressing  Goal: Patient-Specific Goal (Individualized)  Outcome: Progressing  Goal: Absence of New-Onset Illness or Injury  Outcome: Progressing  Intervention: Prevent Fall and Fall Injury  Recent Flowsheet Documentation  Taken 04/27/2023 1400 by Enriqueta Shutter, RN  Safety Interventions: fall reduction program maintained  Taken 04/27/2023 1200 by Enriqueta Shutter, RN  Safety Interventions:   fall reduction program maintained   family at bedside  Taken 04/27/2023 1000 by Enriqueta Shutter, RN  Safety Interventions: fall reduction program maintained  Taken 04/27/2023 0800 by Enriqueta Shutter, RN  Safety Interventions:   fall reduction program maintained   family at bedside  Intervention: Prevent Skin Injury  Recent Flowsheet Documentation  Taken 04/27/2023 1400 by Enriqueta Shutter, RN  Device Skin Pressure Protection: absorbent pad utilized/changed  Skin Protection: incontinence pads utilized  Taken 04/27/2023 1200 by Enriqueta Shutter, RN  Device Skin Pressure Protection: absorbent pad utilized/changed  Skin Protection: incontinence pads utilized  Taken 04/27/2023 1000 by Enriqueta Shutter, RN  Device Skin Pressure Protection: absorbent pad utilized/changed  Skin Protection: incontinence pads utilized  Taken 04/27/2023 0800 by Enriqueta Shutter, RN  Device Skin Pressure Protection: absorbent pad utilized/changed  Skin Protection: incontinence pads utilized  Intervention: Prevent Infection  Recent Flowsheet Documentation  Taken 04/27/2023 1400 by Enriqueta Shutter, RN  Infection Prevention: hand hygiene promoted  Taken 04/27/2023 1200 by Enriqueta Shutter, RN  Infection Prevention: hand hygiene promoted  Taken 04/27/2023 1000 by Enriqueta Shutter, RN  Infection Prevention: hand hygiene promoted  Taken 04/27/2023 0800 by Enriqueta Shutter, RN  Infection Prevention: hand hygiene promoted  Intervention: Prevent VTE (Venous Thromboembolism)  Recent Flowsheet Documentation  Taken 04/27/2023 0800 by Adison Jerger, Sande Brothers, RN  VTE Prevention/Management: anticoagulant therapy  Goal: Optimal Comfort and Wellbeing  Outcome: Progressing  Goal: Home and Community Transition Plan Established  Outcome: Progressing  Goal: Rounds/Family Conference  Outcome: Progressing     Problem: Latex Allergy  Goal: Absence of Allergy Symptoms  Outcome: Progressing  Intervention: Maintain Latex-Aware Environment  Recent Flowsheet Documentation  Taken 04/27/2023 1400 by Enriqueta Shutter, RN  Latex Precautions: latex precautions maintained  Taken 04/27/2023 1000 by Enriqueta Shutter, RN  Latex Precautions: latex precautions maintained  Taken 04/27/2023 0800 by Enriqueta Shutter, RN  Latex Precautions: latex precautions maintained     Problem: Self-Care Deficit  Goal: Improved Ability to Complete Activities of Daily Living  Outcome: Progressing     Problem: Fall Injury Risk  Goal: Absence of Fall and Fall-Related Injury  Outcome: Progressing  Intervention: Promote Injury-Free Environment  Recent Flowsheet Documentation  Taken 04/27/2023 1400 by Enriqueta Shutter, RN  Safety Interventions: fall reduction program maintained  Taken 04/27/2023 1200 by Enriqueta Shutter, RN  Safety Interventions:   fall reduction program maintained   family at bedside  Taken 04/27/2023 1000 by Enriqueta Shutter, RN  Safety Interventions: fall reduction program maintained  Taken 04/27/2023 0800 by Enriqueta Shutter, RN  Safety Interventions:   fall reduction program maintained   family at bedside     Problem: Malnutrition  Goal:  Improved Nutritional Intake  Outcome: Progressing

## 2023-04-27 NOTE — Unmapped (Addendum)
 [facility] Physical Medicine and Rehab  Discharge Summary    Patient Name: [name] [name]       Medical Record Number: [id number]   Date of Birth: 06/05/2000  Sex: Female          Room/Bed: 8J191/4N829-56  Payor Info: Payor: BCBS / Plan: BCBS BLUE OPTIONS/PPO/ADV ([place] ONLY) / Product Type: *No Product type* /      Admit Date: 04/11/2023  Discharge Date: 05/01/2023     Admitting Physician:  Aleen Campi, MD  Attending Provider: Ronelle Nigh, MD   Discharge Physician:  Ronelle Nigh, MD    Admitting Diagnosis: (Debility) 16 Debility (Non-Cardiac/Non-Pulmonary)   Secondary Diagnoses:   Principal Problem:    Chemotherapy-induced nausea  Active Problems:    [name]    Intestinal polyps    [name]    History of colonic polyps    Syncope    Cancer associated pain    Iron deficiency anemia due to chronic blood loss    Dehydration    Physical deconditioning    History of colectomy    Acute deep vein thrombosis (DVT) of axillary vein of right upper extremity (CMS-HCC)    Severe protein-calorie malnutrition (CMS-HCC)    Lower abdominal pain    MDD (major depressive disorder)    Fatigue    Pouchitis (CMS-HCC)    Follow-up:  [ ]  Needs pain medicine follow-up    Admission Functional Status: Discharge Functional Status:   ADLs: Needs assistance with ADLs     ADLs - Needs Assistance: Feeding; Grooming; Bathing; Toileting; UB dressing; LB dressing     Feeding - Needs Assistance: Set Up Assist     Grooming - Needs Assistance: Contact Guard assist; Min assist     Bathing - Needs Assistance: Min assist; Nurse, learning disability - Needs Assistance: Min assist; Contact Guard assist     UB Dressing - Needs Assistance: Min assist; Contact Guard assist     LB Dressing - Needs Assistance: Min assist; Contact Guard assist        Mobility:   Bed Mobility comments: sup>sit with elevated HOB mod(A) for trunk management. sit>sup min(A) for BLE management. multimodal cues provided for sequencing movement to flat HOB     Transfers: sit>stand from low EOB min(A) w/RW; stand>sit w/c 4x during session SBA w/BUE on arm rests of chair; sit>stand from w/c 2x CGA w/RW, 1x SBA w/counter support, 1x w/R HHA (CGA). Standing at sink to brush teeth ~2 minutes, unilateral UE support and trunk lean on counter for increased stability, of note pt with mild BLE tremors 2/2 muscle fatigue.     Gait: pt amb ~5ft + ~11ft w/SBA w/RW. Pt demonstrates decreased stride length, shuffling-like steps, decreased foot clearance B; minimal knee flexion/ankle DF; slow gait speed; intermittent pause between steps. Cues provided for increased foot clearance, no overt LOB. Pt requires seated rest break after each bout of ambulation.        Cognition/Swallow/Speech:  Patient's Vision Adequate to Safely Complete Daily Activities: Yes     Patient's Judgement Adequate to Safely Complete Daily Activities: Yes     Patient's Memory Adequate to Safely Complete Daily Activities: Yes     Patient Able to Express Needs/Desires: Yes     Patient has speech problem: No Therapy Updates:    Team Conference Update (PT): mod IND for bed mobility, mod IND for low pivot transfers, SBA for sit<>stand transfers, CGA for short distance ambulation with RW,  unable to progress stairs  -----------------------------------------------------------------------------------------------------  Team Conference Update (OT): ModI self feeding, oral care, UB dressing, footwear, and seated bathing; SBA LB dressing, toileting, and functional transfers using RW; good to go for DC today  -----------------------------------------------------------------------------------------------------       PHYSICAL THERAPY        OCCUPATIONAL THERAPY       SPEECH LANGUAGE PATHOLOGY     Recreational Therapy:   Pt is a 23 y.o. female with PMH of [name] s/p proctocolectomy and J-pouch, desmoid tumours, chronic abdominal pain and nausea, anxiety, PTSD related to past medical care, DVT RUE s/p Eliquis x3 months admitted for constellation of worsening pain, intolerance of oral intake. She was admitted to [facility] for comprehensive interdisciplinary rehabilitation.      Cognitive: Pt with no cognitive deficits. Emotional: Pt compliant with tx process and continues to remain motivated to continue her recovery journey. Pt expressed benefits of engaging in expressive art activities to remain occupied and assist with healthy coping. Pt acknowledges the difficulties associated with health challenges, and benefits from discussing hopes for the future, in particular completing nursing school and continuing training with her emotional support dog.   Physical: Per PT, pt performed 2-minute walk tests x 3 with rollator, SBA and completed 97 feet, 73 feet and 55 feet. Pt then ambulated with personal rollator ~150 feet and ~100 feet initially mod IND and progressing to SBA during increased fatigue. Per OT, seated upright in bed, pt completed the following for 10 reps x 3: lateral key grip using both blue and green resistive clips, three jaw chuck using both blue and green resistive clips, pincer grip using green and red resistive clips. Social: Pt with plan to d/c to home setting in [place], [place] [facility]) where she lives with her family. Pt involved with [facility] AYA programming for continued support and has a good support system. Leisure: Pt continues with motivation to complete nursing school and enjoys training with her emotional support dog. LRT provided pt with updated information re: [facility] AYA programming including [employer] peer support, [employer], [employer] outings, and [employer], to investigate in the future for wellness opportunities.    Assistive devices:     Indication for Admission / HPI:     [name] [name] is a 23 y/o female with PMHx of [name] s/p proctocolectomy and J-pouch, desmoid tumours, chronic abdominal pain and nausea, anxiety, PTSD related to past medical care, DVT RUE s/p Eliquis x3 months. Ms. Darko was admitted to [facility] on 04/09/2023 for constellation of worsening pain, intolerance of oral intake. Ms. Legros has participated in acute inpatient physical and occupational therapies to improve functional mobility, activity tolerance, functional strength, balance, and endurance in order to facilitate safe performance of ADLs and daily routines. He is stable and awaiting rehab placement. Ms. Kozakiewicz has been referred to [facility] for continued acute medical management, provision of intensive inpatient therapies, and patient/family training to facilitate safe performance of ADLs and mobility, prior to discharge home.     Hospital Course:     Ravynn Hogate is a 23 y.o. female with PMHx as noted below that presented to [facility] for Weakness. Hospital course progressed as below listed by problem:    REHAB:   - PT and OT to maximize functional status with mobility and ADLs as well as prevention of joint contracture.     Acute on chronic abdominal pain and nausea  Complex situation  discussed at length in prior notes. In brief, her pain and nausea are (deeply) multifactorial but consensus among treatment team is that new / unknown organic issue is unlikely. Plan to focus on symptom control, adequate nutrition.  Patient is currently at goal tube feeding, tolerating well.  No visible emesis noted.  Still reports persistent nausea but on aggressive nausea management as below. Chronic pain service consulted, appreciated.  - NO CHANGES to pain regimen without multiparty discussion including patient, unless true emergency.  Seen by chronic pain 1/28, recommended to continue the pain regimen as below no changes made.  - Pain management: Was on oxycodone 20mg  Q4H PRN with dilaudid IV 1mg  Q6H PRN as 2nd line while inpatient. Home butrans patch utilized. Discharged with butrans patch and Oxycodone 10mg  TID as she has been taking in the outpatient setting.   - Spasticity: started on baclofen 5mg  TID while in rehab. We discussed valium but never used this for spasticity while inpatient as she has a contract with outpatient pain provider  - Nausea: Utilized IV zofran and IV phenergan for much of her stay in rehab. We transitioned her to oral phenergan 2/17. Also used scopolamine. She refused the zyprexa nightly.   - Dyspepsia: She used pepcid BID and protonix BID  - GI motility: used simethicone TID PRN and naloxegol PRN    C diff infection  C diff assay positive on 2/19. GI recommending oral vanc for 14 days. Vancomycin 125mg  4 times daily for 14 days (2/19-)     Left arm PICC line site swelling, pain and ecchymosis - DVT  Left arm PICC line site noted mild swelling and mild ecchymosis from insertion.  No visible swelling noted below the elbow.  PVL confirmed acute DVT, started on anticoagulation and removed PICC line on 1/28, given acute DVT also patient is currently not on any IV antibiotic. Removed PICC line 1/28,  swelling around prior picc line site improving, mild redness/ rash  due to tape, still has bruises but resolving. Discussed with outpatient heme provider who wanted her to switch to Eliquis.      Inadequate oral intake  Abnormal gut anatomy notwithstanding, it is the consensus among hospitalist and GI services that oral and/or enteral feeding should be sufficient to meet caloric and hydration needs. Has not developed refeeding syndrome. TPN will not be offered in forseeable future. cyclic feeding with Nutren 2.0, at goal rate 50 cc/h for 16 hours with free water flushes every 3 hours of 75cc.   [ ]  GI to follow and manage wean from enteral nutrition at discharge     Elevated transaminases  Improving: Labs with AST 21, ALT 70, downtrending from prior. No evidence of cholestasis or synthetic dysfunction. Presume drug-related. No longer following closely as trend is well established.     Pouchitis (dx 03/2023), resolving  No plan for repeat endoscopy or colonoscopy during this admission, per GI, given recent exam. No significant decrement in Hb so far. s/p Cefdinir 300 mg bid for 4 weeks (end = 04/07/2023) Cefdinir 300 mg daily (end = 05/05/2023). Held once she had confirmed C. Diff infection     Desmoid tumors  okay to hold nirogesat while having difficulty with oral meds, no urgency to resume per Dr [name]     Mood / anxiety  continued escitalopram and lorazepam 1 mg bid prn (home medication)     Consults: None    Procedures: None    Discharge Medications:      Your Medication List  STOP taking these medications      cetirizine 1 mg/mL syrup  Commonly known as: ZYRTEC     diphenhydrAMINE 12.5 mg/5 mL liquid  Commonly known as: BENADRYL     enoxaparin 60 mg/0.6 mL injection  Commonly known as: LOVENOX     escitalopram oxalate 5 mg/5 mL solution  Commonly known as: LEXAPRO  Replaced by: escitalopram oxalate 5 MG tablet     famotidine 40 mg/5 mL (8 mg/mL) suspension  Commonly known as: PEPCID     guar gum Pack  Commonly known as: NUTRISOURCE     HYDROmorphone (PF) 1 mg/mL injection  Commonly known as: DILAUDID     hyoscyamine 0.125 mg tablet  Commonly known as: LEVSIN     lidocaine 4 % patch  Commonly known as: ASPERCREME     OLANZapine zydis 5 MG disintegrating tablet  Commonly known as: ZYPREXA     ondansetron 4 mg/2 mL injection  Commonly known as: ZOFRAN     oxyCODONE 5 mg/5 mL solution  Commonly known as: ROXICODONE  Replaced by: oxyCODONE 5 MG immediate release tablet            START taking these medications      apixaban 5 mg Tab  Commonly known as: ELIQUIS  Take 1 tablet (5 mg total) by mouth two (2) times a day.     baclofen 5 mg Tab tablet  Commonly known as: LIORESAL  Take 1 tablet (5 mg total) by mouth Three (3) times a day.     escitalopram oxalate 5 MG tablet  Commonly known as: LEXAPRO  Take 1 tablet (5 mg total) by mouth daily.  Replaces: escitalopram oxalate 5 mg/5 mL solution     oxyCODONE 5 MG immediate release tablet  Commonly known as: ROXICODONE  Take 2 tablets (10 mg total) by mouth Three (3) times a day as needed for pain for up to 5 days.  Replaces: oxyCODONE 5 mg/5 mL solution     promethazine 12.5 MG tablet  Commonly known as: PHENERGAN  Take 1 tablet (12.5 mg total) by mouth every six (6) hours as needed for nausea.     psyllium 3.4 gram packet  Commonly known as: METAMUCIL  Dissolve as directed and take 1 packet by mouth Three (3) times a day.     simethicone 80 MG chewable tablet  Commonly known as: MYLICON  Chew 1 tablet (80 mg total) by mouth every six (6) hours as needed for flatulence (bloating, gas).            CHANGE how you take these medications      cefdinir 250 mg/5 mL suspension  Commonly known as: OMNICEF  Take 6 mL (300 mg total) by mouth daily for 8 days. Discard remainder.  What changed: additional instructions     hydrocortisone 1 % cream  Apply topically two (2) times a day as needed (itching).  What changed: Another medication with the same name was removed. Continue taking this medication, and follow the directions you see here.            CONTINUE taking these medications      acetaminophen 650 mg/20.3 mL Soln  Commonly known as: TYLENOL  31.2 mL (1,000 mg total) by Enteral tube: post-pyloric (duodenum, jejunum) route every eight (8) hours.     ammonium lactate 12 % lotion  Commonly known as: LAC-HYDRIN  Apply 1 Application topically two (2) times a day.     buprenorphine 20 mcg/hour Ptwk  transdermal patch  Commonly known as: BUTRANS  Place 1 patch on the skin once a week.     fluticasone propionate 50 mcg/actuation nasal spray  Commonly known as: FLONASE  1 spray into each nostril once a week.     LORazepam 1 MG tablet  Commonly known as: ATIVAN  Take 1 tablet (1 mg total) by mouth two (2) times a day as needed for anxiety.     melatonin 3 mg Tab  Take 1 tablet (3 mg total) by mouth nightly as needed.     multivitamin with folic acid 400 mcg Tab tablet  Take 1 tablet by mouth daily. naloxegol 12.5 mg tablet  Commonly known as: MOVANTIK  Take 1 tablet (12.5 mg total) by mouth daily as needed (constipation).     pantoprazole 40 mg injection  Commonly known as: Protonix  Infuse 10 mL (40 mg total) into a venous catheter two (2) times a day.     scopolamine 1 mg over 3 days  Commonly known as: TRANSDERM-SCOP  Place 1 patch (1 mg total) on the skin every third day.     sucralfate 100 mg/mL suspension  Commonly known as: CARAFATE  Take 10 mL (1 g total) by mouth four (4) times a day.     zinc oxide-cod liver oil 40 % Pste  Commonly known as: DESITIN 40%  Apply topically daily as needed (perineal irritation).            Significant Diagnostic Studies: Reviewed in Epic    Discharge Instructions:     Medications:  Please take all medications as prescribed below and note any changes.    Other Instructions and Information:   Follow Up instructions and Outpatient Referrals     Ambulatory Referral to Occupational Therapy      Suggest Treatment: Evaluation with suggestions for treatment    Reason for referral: [name], weakness, deconditioning    Ambulatory Referral to Physical Therapy      Suggest Treatment: Evaluation with suggestions for treatment    Reason for referral: [name], weakness, deconditioning    Discharge instructions            Follow-Up Appointments:   Please make all follow-up appointments as noted below. If not already scheduled, please set-up an appointment with a Primary Care Provider for continued general medical care.  Other Instructions       Discharge instructions      You were hospitalized at [facility] after GI bleed. You have made excellent progress during your rehab stay.     MEDICATIONS:  - Use an application or web page such as [email or web address] to create an easy to read schedule regarding when to take your medications.    DIET:  Tube feeds    ACTIVITY:  Recovery takes several months, especially if you are elderly or have another illness. Your body uses a lot of energy recovering and needs time and rest. Gradually increase your activity taking rest periods as needed to ensure that you are being safe.     Meds:  - Butrans patch for 7 days at a time  - Oxycodone 10mg  three times daily as needed  (Make an appointment with your pain doctor)  -Pepcid nightly  - Lexapro 5mg  daily  - Tylenol every 8 hours  - eliquis 5mg  twice daily  - baclofen 5mg  three times daily  - phenergan every 6 hours as needed for nausea  - simethicone for gas or bloating three times  daily as needed  - metamucil three times daily for stool bulking    Pain:   You have been prescribed a narcotic pain medication. Do not drive or make critical/important decisions while taking this medication. Narcotic medications may cause constipation. Ensure you have adequate (>25 grams/day) of fiber in your diet and drink at least 64 oz. of water daily. You may also wish to take an over the counter stool softener once or twice daily as needed for constipation while taking narcotic pain medication.    WHEN TO CALL YOUR PHYSICIAN:  Following discharge from the hospital, please call 911 immediately and go to the nearest Emergency Department if you notice:  - Temperature greater than 101.25F or chills  - Pain not controlled with prescribed medications  - Uncontrolled nausea or vomiting  - Worsening or persistent abdominal pain  - Inability to pass stool for 3 days or more   - Severe or worsening headache or acute changes in vision  - Weakness or loss of sensation in your arms or legs  - Chest pain  - Shortness of breath    If you develop these symptoms or if you have trouble obtaining any of your medications, you may also call the [facility] at [phone] as needed.    You may go to the [facility] at [address] in [place] or call the [facility] Link at [phone] for further assistance.    FOLLOW-UP:  Please see the list below of follow-up appointments and make note of the additional providers you will need to see:    - Follow-up with PM&R on 3/3    - Follow-up with hematology on 2/26    - Follow-up with GI on 4/3    - Follow-up with your Primary Care Physician in 1-2 weeks to inform him or her of your recent hospital admission and any medication changes. If not already scheduled, please contact to set-up an appointment.    [facility] Medicine [facility] Rehabilitation Clinic  [facility]   [address] [place], [place] [address]   Phone: [phone]  Fax: [phone]          Appointments which have been scheduled for you      May 04, 2023 12:00 PM  OT TREATMENT with REHAB OT GREEN [facility]  REHAB OCCUPATIONAL THERAPY [facility] REGION) [address]  [place] [address]        May 04, 2023 1:00 PM  PT TREATMENT with REHAB PT GREEN [facility]  REHAB PHYSICAL THERAPY [facility] REGION) [address]  [place] [address]        May 04, 2023 3:00 PM  TREATMENT with REHAB PT GREEN [facility]  REHAB PHYSICAL THERAPY [facility] REGION) [address]  [place] [address]        May 05, 2023 10:00 AM  OT TREATMENT with REHAB OT GREEN [facility]  REHAB OCCUPATIONAL THERAPY [facility] REGION) [address]  [place] [address]        May 05, 2023 11:00 AM  PT TREATMENT with REHAB PT GREEN [facility]  REHAB PHYSICAL THERAPY [facility] REGION) [address]  [place] [address]        May 05, 2023 12:30 PM  TREATMENT with REHAB PT GREEN [facility]  REHAB PHYSICAL THERAPY [facility] REGION) [address]  [place]  [address]        May 06, 2023 9:15 AM  (Arrive by 9:00 AM)  RETURN HEM BENIGN with Cassiopeia Anne Cutchis Homero [place], [place]  [facility] BENIGN HEMATOLOGY CLINIC [place] [facility] REGION) [address]  [facility] 1 through 4  [place] [address]  [phone]        May 11, 2023 3:00 PM  (Arrive by 2:45 PM)  RETURN  GENERAL with Carollee Leitz, MD  [facility] PHYSICAL MEDICINE [facility] [address] [place] [facility] REGION) [phone] [place] [address]  [phone]        Jun 11, 2023 9:00 AM  (Arrive by 8:45 AM)  RETURN IBD with Modena Nunnery, MD  [facility] GI MEDICINE [place] [facility] REGION) [address]  [facility] 1 through 4  [place] [address]  [phone]             Discharge Day Services:  The patient was seen and examined on the day of discharge. Vitals signs and exam are stable. Therapy goals met. Discharge medications and instructions were discussed with the patient and family, and all questions were answered.    Time spent for discharge: 30 minutes or greater    Physical Exam:  Vitals:    05/01/23 1100   BP: 117/59   Pulse: 85   Resp: 14   Temp: 36.7 ??C (98.1 ??F)   SpO2: 97%              GENERAL: In no acute distress, interactive, looks stated age  HEENT: normocephalic, EOMI, sclera anicteric, NGT in place  CV: Warm and well perfused  Respiratory: Normal work of breathing on room air  GI: Non distended, diffusely tender, no guarding  SKIN: No visible cyanosis, rashes, lacerations or erythema.  EXT: No deformity, no clubbing or edema  PSYCH: Appropriate mood and affect.  NEURO: Alert. Awake. Spontaneous speech was fluent. Comprehension was intact. Moves all 4 extremities    Discharge Condition: Stable    Discharge Disposition: acute hospital    Dictation software was used while making this note

## 2023-04-27 NOTE — Unmapped (Signed)
 Physical Medicine and Rehabilitation  Daily Progress Note [facility]    ASSESSMENT:     [name] [name] is a 23 y.o. female 23 y.o. female with PMH of [name] s/p proctocolectomy and J-pouch, desmoid tumours, chronic abdominal pain and nausea, anxiety, PTSD related to past medical care, DVT RUE s/p Eliquis x3 months admitted for constellation of worsening pain, intolerance of oral intake. She is now admitted to [facility] for comprehensive interdisciplinary rehabilitation.      Rehab Impairment Group Code [facility]):   (Debility) 16 Debility (Non-Cardiac/Non-Pulmonary)   Etiology: worsening pain, intolerance of oral intake     PLAN:     This patient is admitted to the Physical Medicine and Rehabilitation - Inpatient - A service from 8am-5pm on weekdays for questions regarding this patient. After hours, weekends, and holidays please contact the 1st Call resident pager     REHAB:   - PT and OT to maximize functional status with mobility and ADLs as well as prevention of joint contracture.   - P&O for assistive devices PRN.  - To be discussed in weekly Interdisciplinary Team Conference.     Acute on chronic abdominal pain and nausea  Complex situation discussed at length in prior notes. In brief, her pain and nausea are (deeply) multifactorial but consensus among treatment team is that new / unknown organic issue is unlikely. Plan to focus on symptom control, adequate nutrition.  Patient is currently at goal tube feeding, tolerating well.  No visible emesis noted.  Still reports persistent nausea but on aggressive nausea management as below. Chronic pain service consulted, appreciated.  - NO CHANGES to pain regimen without multiparty discussion including patient, unless true emergency.  Seen by chronic pain 1/28, recommended to continue the pain regimen as below no changes made.  - Pain management:  -- oxycodone (liquid) 20 mg q4 prn-first-line for pain  -- changed hydromorphone 1 mg IV q6prn -second line for pain  (ONLY IF inadequate relief 1 hour after oral alternative)  -- buprenorphine 20 mcg/h patch   -- hyoscyamine prn  -- Baclofen 5mg  TID  - Nausea: discuss about transitioning one of the IV nausea medication to p.o.  -- ondansetron IV q8 PRN  -- promethazine PO Q6H 12.5mg   -- scopolamine patch  -- olanzapine 5 mg in evening  -- no diphenhydramine IV in absence of clear indication  - Dyspepsia:  -- famotidine bid  -- PPI IV q12  - GI motility:  -- naloxegol prn  -- Simethicone 3 times daily  -- okay to use loperamide sparingly     Left arm PICC line site swelling, pain and ecchymosis - DVT  Left arm PICC line site noted mild swelling and mild ecchymosis from insertion.  No visible swelling noted below the elbow.  PVL confirmed acute DVT, started on anticoagulation and removed PICC line on 1/28, given acute DVT also patient is currently not on any IV antibiotic. Removed PICC line 1/28,  swelling around prior picc line site improving, mild redness/ rash  due to tape, still has bruises but resolving.   - Eliquis 5mg  BID; will need 3 months of therapy  - Stopped lovenox     Inadequate oral intake  Abnormal gut anatomy notwithstanding, it is the consensus among hospitalist and GI services that oral and/or enteral feeding should be sufficient to meet caloric and hydration needs. Has not developed refeeding syndrome. TPN will not be offered in forseeable future.  - cyclic feeding with Nutren 2.0, at goal rate  50 cc/h for 16 hours with free water flushes every 3 hours of 75cc.   - Calorie count per nutrition  [ ]  GI to follow and manage wean from enteral nutrition at discharge     Elevated transaminases  Improving: Labs with AST 21, ALT 70, downtrending from prior. No evidence of cholestasis or synthetic dysfunction. Presume drug-related. No longer following closely as trend is well established.     Pouchitis (dx 03/2023), resolving  No plan for repeat endoscopy or colonoscopy during this admission, per GI, given recent exam. No significant decrement in Hb so far. s/p Cefdinir 300 mg bid for 4 weeks (end = 04/07/2023)  - Cefdinir 300 mg daily (end = 05/05/2023)  - CBC weekly     Desmoid tumors  - okay to hold nirogesat while having difficulty with oral meds, no urgency to resume per Dr [name]     Mood / anxiety  - continue escitalopram  - lorazepam 1 mg bid prn (home medication)     Daily Checklist  - Diet: Regular diet with cyclic tube feeds  - DVT PPX: eliquis  - GI PPX: IV protonix BID  - Access: piv    DISPO: Patient to be discussed at weekly interdisciplinary team conference.   - EDD: 2/18  - Follow-up: PCP, PM&R, GI, Oncology    SUBJECTIVE:     Interval Events:  NAEO. She reports having a difficult night. Labs are still stable. She is agreeable with transitioning phenergan to PO today. After discussion with her and her mother, we will plan on discharge tomorrow after therapies.     OBJECTIVE:     Vital signs (last 24 hours):  Temp:  [36.5 ??C (97.7 ??F)-36.8 ??C (98.2 ??F)] 36.8 ??C (98.2 ??F)  Heart Rate:  [77-78] 77  Resp:  [18] 18  BP: (111-117)/(69-72) 111/72  MAP (mmHg):  [83] 83  SpO2:  [97 %-98 %] 97 %    Intake/Output (last 3 shifts):  I/O last 3 completed shifts:  In: 385 [I.V.:10; NG/GT:375]  Out: -     Physical Exam:   GEN: NAD, sitting in manual wheelchair  EYES: sclera anicteric, conjunctiva clear   HEENT: MMM, NG tube in place  NECK: trachea midline  CV: warm extremities, well perfused, no cyanosis   RESP:  NWOB on RA  ABD: Not distended  SKIN: no visible masses, lesions, rashes, ecchymoses.   MSK: no notable contractures or ext swelling  NEURO: Alert, MAE, speech fluid and coherent, responds appropriately to questions   PSYCH: mood euthymic, affect appropriate, thought process logical         Medications:  Scheduled    acetaminophen (TYLENOL) oral liquid Q8H SCH    ammonium lactate (LAC-HYDRIN) 12 % lotion 1 Application BID    apixaban (ELIQUIS) tablet 5 mg BID    baclofen (LIORESAL) tablet 5 mg TID    buprenorphine 20 mcg/hour transdermal patch 1 patch Weekly    cefdinir (OMNICEF) oral suspension Daily    escitalopram oxalate (LEXAPRO) tablet 5 mg Daily    famotidine (PEPCID) oral suspension Nightly    flu vaccine TS 2024-25(35mos up)(PF)(FLULAVAL, FLUARIX, FLUZONE) During hospitalization    fluticasone propionate (FLONASE) 50 mcg/actuation nasal spray 1 spray Weekly    lidocaine (ASPERCREME) 4 % 1 patch Daily    multivitamin with folic acid 400 mcg tablet 1 tablet Daily    OLANZapine zydis (ZYPREXA) disintegrating tablet 5 mg Q PM    pantoprazole (Protonix) injection 40 mg BID    promethazine (  PHENERGAN) tablet 12.5 mg Q6H    psyllium (METAMUCIL) 3.4 gram packet 1 packet TID    scopolamine (TRANSDERM-SCOP) 1 mg over 3 days topical patch 1 mg Q72H    simethicone (MYLICON) chewable tablet 80 mg TID    sodium chloride (NS) 0.9 % flush 10 mL Q8H     PRN cetirizine, 20 mg, Daily PRN  hydrocortisone, , BID PRN  hydrocortisone, , BID PRN  HYDROmorphone, 1 mg, Q6H PRN  hyoscyamine, 125 mcg, Q4H PRN  loperamide, 2 mg, QID PRN  LORazepam, 1 mg, BID PRN  melatonin, 3 mg, Nightly PRN  naloxegol, 12.5 mg, Daily PRN  ondansetron, 8 mg, Q8H PRN  oxyCODONE, 20 mg, Q4H PRN  zinc oxide-cod liver oil, , Daily PRN          Labs/Studies: Reviewed     Radiology Results:   No results found.          Quality Indicators      Hearing, Speech, and Vision  Ability to Hear: Adequate  Ability to See in Adequate Light: Adequate  Expression of Ideas and Wants: Without difficulty  Understanding Verbal and Non-Verbal Content: Understands    Cognitive Pattern Assessment  Cognitive Pattern Assessment Used: BIMS  Brief Interview for Mental Status (BIMS)  Repetition of Three Words (First Attempt): 3  Temporal Orientation: Year: Correct  Temporal Orientation: Month: Accurate within 5 days  Temporal Orientation: Day: Correct  Recall: Sock: Yes, no cue required  Recall: Blue: Yes, no cue required  Recall: Bed: Yes, no cue required  BIMS Summary Score: 15 Nutritional Approaches  Nutritional Approach: Feeding tube    ADLs  Admission Current   Eating Assistance Needed: Set-up / clean-up    CARE Score - 5 Assistance Needed: Independent    CARE Score - 6   Oral Hygiene  Assistance Needed: Physical assistance Total assistance  CARE Score - 1  Assistance Needed: Independent    CARE Score - 6   Bladder Continence       Bowel Continence       Toileting Hygiene Assistance Needed: Incidental touching    CARE Score - 4 Assistance Needed: Incidental touching    CARE Score - 4    Toilet Transfer Assistance Needed: Physical assistance 25% or less  CARE Score - 3 Assistance Needed: Incidental touching    CARE Score - 4   Shower/Bathe Self Assistance Needed: Set-up / clean-up    CARE Score - 88  Assistance Needed: Set-up / clean-up    CARE Score - 5   Upper Body Dressing Assistance Needed: Physical assistance 26%-50%  CARE Score - 3  Assistance Needed: Independent    CARE Score - 6   Lower Body Dressing Assistance Needed: Physical assistance 76% or more  CARE Score - 2  Assistance Needed: Incidental touching    CARE Score - 4   On/Off Footwear Assistance Needed: Physical assistance Total assistance  CARE Score - 1  Assistance Needed: Independent    CARE Score - 6         Transfers Admission Current   Bed to Chair Assistance Needed: Incidental touching    CARE Score - 4  Assistance Needed: Set-up / clean-up    CARE Score - 5     Lying to Sitting Assistance Needed: Physical assistance 26%-50%  CARE Score - 3  Assistance Needed: Adaptive equipment, Independent    CARE Score - 6   Roll Left/Right Assistance Needed: Physical assistance 25% or less  CARE Score -  3  Assistance Needed: Independent, Adaptive equipment    CARE Score - 6   Sit to Lying Assistance Needed: Physical assistance 26%-50%  CARE Score - 3  Assistance Needed: Independent, Adaptive equipment    CARE Score - 6   Sit to Stand Assistance Needed: Incidental touching    CARE Score - 4  Assistance Needed: Set-up / clean-up, Adaptive equipment    CARE Score - 5         Mobility Admission Current   Walk 10 Feet Assistance Needed: Adaptive equipment, Physical assistance 25% or less  CARE Score - 3 Assistance Needed: Set-up / clean-up, Adaptive equipment    CARE Score - 5     Walk 50 Feet 2 Turns Assistance Needed: Physical assistance 51%-75%  CARE Score - 2 Assistance Needed: Physical assistance    CARE Score - 88   Walk 150 Feet      CARE Score - 88       CARE Score - 88   Walk 10 Feet Uneven      CARE Score - 88       CARE Score - 88   1 Step (Curb) Assistance Needed: Physical assistance, Adaptive equipment 25% or less  CARE Score - 88  Assistance Needed: Physical assistance, Adaptive equipment    CARE Score - 3   4 Steps      CARE Score - 88       CARE Score - 88   12 Steps      CARE Score - 88       CARE Score - 88   Picking Up Object      CARE Score - 10       CARE Score - 88   Wheelchair/Scooter Use        Wheel 50 Feet 2 Turns Assistance Needed: Independent      CARE Score - Wheel 50 Feet with Two Turns: 6    Type of Wheelchair/Scooter: Manual Assistance Needed: Independent      CARE Score - Wheel 50 Feet with Two Turns: 6    Type of Wheelchair/Scooter: Manual   Wheel 150 Feet Assistance Needed: Physical assistance 26%-50%    CARE Score - Wheel 150 Feet: 3    Type of Wheelchair/Scooter: Manual  Assistance Needed: Independent      CARE Score - Wheel 150 Feet: 6    Type of Wheelchair/Scooter: Manual

## 2023-04-28 MED ORDER — ESCITALOPRAM 5 MG TABLET
ORAL_TABLET | Freq: Every day | ORAL | 2 refills | 30.00 days | Status: CP
Start: 2023-04-28 — End: ?

## 2023-04-28 MED ADMIN — HYDROmorphone (PF) (DILAUDID) injection 1 mg: 1 mg | INTRAVENOUS | @ 23:00:00 | Stop: 2023-05-05

## 2023-04-28 MED ADMIN — apixaban (ELIQUIS) tablet 5 mg: 5 mg | ORAL | @ 03:00:00

## 2023-04-28 MED ADMIN — oxyCODONE (ROXICODONE) 5 mg/5 mL solution 20 mg: 20 mg | ORAL | @ 18:00:00 | Stop: 2023-05-05

## 2023-04-28 MED ADMIN — sodium chloride (NS) 0.9 % flush 10 mL: 10 mL | INTRAVENOUS | @ 03:00:00

## 2023-04-28 MED ADMIN — simethicone (MYLICON) chewable tablet 80 mg: 80 mg | ORAL | @ 17:00:00

## 2023-04-28 MED ADMIN — acetaminophen (TYLENOL) oral liquid: 1000 mg | ENTERAL | @ 11:00:00

## 2023-04-28 MED ADMIN — escitalopram oxalate (LEXAPRO) tablet 5 mg: 5 mg | ENTERAL | @ 15:00:00

## 2023-04-28 MED ADMIN — ondansetron (ZOFRAN) injection 8 mg: 8 mg | INTRAVENOUS | @ 23:00:00

## 2023-04-28 MED ADMIN — promethazine (PHENERGAN) tablet 12.5 mg: 12.5 mg | ORAL | @ 15:00:00

## 2023-04-28 MED ADMIN — promethazine (PHENERGAN) tablet 12.5 mg: 12.5 mg | ORAL | @ 22:00:00

## 2023-04-28 MED ADMIN — psyllium (METAMUCIL) 3.4 gram packet 1 packet: 1 | ORAL | @ 15:00:00

## 2023-04-28 MED ADMIN — oxyCODONE (ROXICODONE) 5 mg/5 mL solution 20 mg: 20 mg | ORAL | @ 12:00:00 | Stop: 2023-05-05

## 2023-04-28 MED ADMIN — promethazine (PHENERGAN) tablet 12.5 mg: 12.5 mg | ORAL | @ 03:00:00

## 2023-04-28 MED ADMIN — acetaminophen (TYLENOL) oral liquid: 1000 mg | ENTERAL | @ 18:00:00

## 2023-04-28 MED ADMIN — simethicone (MYLICON) chewable tablet 80 mg: 80 mg | ORAL | @ 22:00:00

## 2023-04-28 MED ADMIN — HYDROmorphone (PF) (DILAUDID) injection 1 mg: 1 mg | INTRAVENOUS | @ 17:00:00 | Stop: 2023-05-05

## 2023-04-28 MED ADMIN — pantoprazole (Protonix) injection 40 mg: 40 mg | INTRAVENOUS | @ 15:00:00

## 2023-04-28 MED ADMIN — baclofen (LIORESAL) tablet 5 mg: 5 mg | ORAL | @ 03:00:00

## 2023-04-28 MED ADMIN — cefdinir (OMNICEF) oral suspension: 300 mg | ORAL | @ 15:00:00 | Stop: 2023-05-06

## 2023-04-28 MED ADMIN — sodium bicarbonate tablet 648 mg: 648 mg | ORAL | @ 17:00:00 | Stop: 2023-04-28

## 2023-04-28 MED ADMIN — baclofen (LIORESAL) tablet 5 mg: 5 mg | ORAL | @ 18:00:00

## 2023-04-28 MED ADMIN — HYDROmorphone (PF) (DILAUDID) injection 1 mg: 1 mg | INTRAVENOUS | @ 03:00:00 | Stop: 2023-05-05

## 2023-04-28 MED ADMIN — promethazine (PHENERGAN) tablet 12.5 mg: 12.5 mg | ORAL | @ 11:00:00

## 2023-04-28 MED ADMIN — HYDROmorphone (PF) (DILAUDID) injection 1 mg: 1 mg | INTRAVENOUS | @ 11:00:00 | Stop: 2023-05-05

## 2023-04-28 MED ADMIN — pancrelipase (Lip-Prot-Amyl) (CREON) 24,000-76,000 -120,000 unit delayed release capsule 24,000 units of lipase: 1 | ORAL | @ 17:00:00 | Stop: 2023-04-28

## 2023-04-28 MED ADMIN — baclofen (LIORESAL) tablet 5 mg: 5 mg | ORAL | @ 15:00:00

## 2023-04-28 MED ADMIN — pantoprazole (Protonix) injection 40 mg: 40 mg | INTRAVENOUS | @ 03:00:00

## 2023-04-28 MED ADMIN — acetaminophen (TYLENOL) oral liquid: 1000 mg | ENTERAL | @ 03:00:00

## 2023-04-28 MED ADMIN — famotidine (PEPCID) oral suspension: 20 mg | ENTERAL | @ 03:00:00

## 2023-04-28 MED ADMIN — oxyCODONE (ROXICODONE) 5 mg/5 mL solution 20 mg: 20 mg | ORAL | @ 22:00:00 | Stop: 2023-05-05

## 2023-04-28 MED ADMIN — sodium chloride (NS) 0.9 % flush 10 mL: 10 mL | INTRAVENOUS | @ 11:00:00

## 2023-04-28 MED ADMIN — simethicone (MYLICON) chewable tablet 80 mg: 80 mg | ORAL | @ 11:00:00

## 2023-04-28 MED ADMIN — oxyCODONE (ROXICODONE) 5 mg/5 mL solution 20 mg: 20 mg | ORAL | @ 05:00:00 | Stop: 2023-05-05

## 2023-04-28 MED ADMIN — scopolamine (TRANSDERM-SCOP) 1 mg over 3 days topical patch 1 mg: 1 | TOPICAL | @ 15:00:00

## 2023-04-28 MED ADMIN — ondansetron (ZOFRAN) injection 8 mg: 8 mg | INTRAVENOUS | @ 12:00:00

## 2023-04-28 MED ADMIN — lidocaine (ASPERCREME) 4 % 1 patch: 1 | TRANSDERMAL | @ 15:00:00

## 2023-04-28 MED ADMIN — sodium chloride (NS) 0.9 % flush 10 mL: 10 mL | INTRAVENOUS | @ 18:00:00

## 2023-04-28 MED ADMIN — apixaban (ELIQUIS) tablet 5 mg: 5 mg | ORAL | @ 15:00:00

## 2023-04-28 NOTE — Unmapped (Signed)
 Patient is alert and oriented. Family at the bedside. Patient with NGT at left nare. Checked placement and secured. Patient with PIV at right hand and LFA. Patient with abdominal pain and nausea, due meds given. Feeding was decreased from 16 hrs to 12 hrs as ordered. Per patient, feels much better.      NGT is clogged. Difficult to flush NGT this morning. Notified MD. Carried out orders. Attended all her needs. Bed on low position.     Problem: Rehabilitation (IRF) Plan of Care  Goal: Plan of Care Review  Outcome: Progressing  Goal: Patient-Specific Goal (Individualized)  Outcome: Progressing  Goal: Absence of New-Onset Illness or Injury  Outcome: Progressing  Intervention: Prevent Fall and Fall Injury  Recent Flowsheet Documentation  Taken 04/28/2023 1200 by Enriqueta Shutter, RN  Safety Interventions: fall reduction program maintained  Taken 04/28/2023 0800 by Enriqueta Shutter, RN  Safety Interventions:   fall reduction program maintained   family at bedside  Intervention: Prevent Skin Injury  Recent Flowsheet Documentation  Taken 04/28/2023 1200 by Enriqueta Shutter, RN  Device Skin Pressure Protection: absorbent pad utilized/changed  Skin Protection: incontinence pads utilized  Taken 04/28/2023 0800 by Enriqueta Shutter, RN  Device Skin Pressure Protection: absorbent pad utilized/changed  Skin Protection: incontinence pads utilized  Intervention: Prevent Infection  Recent Flowsheet Documentation  Taken 04/28/2023 1200 by Enriqueta Shutter, RN  Infection Prevention: hand hygiene promoted  Taken 04/28/2023 0800 by Enriqueta Shutter, RN  Infection Prevention: hand hygiene promoted  Intervention: Prevent VTE (Venous Thromboembolism)  Recent Flowsheet Documentation  Taken 04/28/2023 0800 by Adley Castello, Sande Brothers, RN  VTE Prevention/Management: anticoagulant therapy  Goal: Optimal Comfort and Wellbeing  Outcome: Progressing  Goal: Home and Community Transition Plan Established  Outcome: Progressing  Goal: Rounds/Family Conference  Outcome: Progressing     Problem: Latex Allergy  Goal: Absence of Allergy Symptoms  Outcome: Progressing     Problem: Self-Care Deficit  Goal: Improved Ability to Complete Activities of Daily Living  Outcome: Progressing     Problem: Fall Injury Risk  Goal: Absence of Fall and Fall-Related Injury  Outcome: Progressing  Intervention: Promote Injury-Free Environment  Recent Flowsheet Documentation  Taken 04/28/2023 1200 by Enriqueta Shutter, RN  Safety Interventions: fall reduction program maintained  Taken 04/28/2023 0800 by Enriqueta Shutter, RN  Safety Interventions:   fall reduction program maintained   family at bedside     Problem: Malnutrition  Goal: Improved Nutritional Intake  Outcome: Progressing

## 2023-04-28 NOTE — Unmapped (Signed)
 A&Ox4. She complains of moderate to severe abdominal pain being managed PRN with narcotic analgesics. She has 2 PIVs; both flushes good, intact, and clean. She tolerated her Cyclic TF and currently runs at a goal rate of 50 ml/hr. She ambulates with a rollator with assist. She's continent x 2. Plan of care ongoing. Safety measures are maintained will continue to monitor and give report to next shift.        Problem: Rehabilitation (IRF) Plan of Care  Goal: Absence of New-Onset Illness or Injury  Intervention: Prevent Fall and Fall Injury  Recent Flowsheet Documentation  Taken 04/28/2023 0000 by Rudell Cobb, RN  Safety Interventions: fall reduction program maintained  Taken 04/27/2023 2200 by Rudell Cobb, RN  Safety Interventions: fall reduction program maintained  Taken 04/27/2023 2000 by Rudell Cobb, RN  Safety Interventions: fall reduction program maintained  Intervention: Prevent Skin Injury  Recent Flowsheet Documentation  Taken 04/28/2023 0200 by Rudell Cobb, RN  Device Skin Pressure Protection: absorbent pad utilized/changed  Taken 04/28/2023 0000 by Rudell Cobb, RN  Device Skin Pressure Protection: absorbent pad utilized/changed  Skin Protection: incontinence pads utilized  Taken 04/27/2023 2300 by Rudell Cobb, RN  Device Skin Pressure Protection: absorbent pad utilized/changed  Skin Protection: incontinence pads utilized  Taken 04/27/2023 2200 by Rudell Cobb, RN  Device Skin Pressure Protection: absorbent pad utilized/changed  Skin Protection: incontinence pads utilized  Taken 04/27/2023 2000 by Rudell Cobb, RN  Device Skin Pressure Protection: absorbent pad utilized/changed  Skin Protection: incontinence pads utilized  Intervention: Prevent Infection  Recent Flowsheet Documentation  Taken 04/28/2023 0200 by Rudell Cobb, RN  Infection Prevention:   hand hygiene promoted   rest/sleep promoted  Taken 04/27/2023 2200 by Rudell Cobb, RN  Infection Prevention:   hand hygiene promoted rest/sleep promoted  Taken 04/27/2023 2000 by Rudell Cobb, RN  Infection Prevention:   hand hygiene promoted   rest/sleep promoted  Intervention: Prevent VTE (Venous Thromboembolism)  Recent Flowsheet Documentation  Taken 04/28/2023 0200 by Rudell Cobb, RN  Anti-Embolism Device Status: (Eliquis) Other (Comment)  Taken 04/28/2023 0000 by Rudell Cobb, RN  Anti-Embolism Device Status: (Eliquis) Other (Comment)  Taken 04/27/2023 2300 by Rudell Cobb, RN  VTE Prevention/Management: anticoagulant therapy  Taken 04/27/2023 2200 by Rudell Cobb, RN  Anti-Embolism Device Status: (Eliquis) Other (Comment)  Taken 04/27/2023 2000 by Rudell Cobb, RN  Anti-Embolism Device Status: (Eliquis) Other (Comment)     Problem: Rehabilitation (IRF) Plan of Care  Goal: Absence of New-Onset Illness or Injury  Intervention: Prevent Skin Injury  Recent Flowsheet Documentation  Taken 04/28/2023 0200 by Rudell Cobb, RN  Device Skin Pressure Protection: absorbent pad utilized/changed  Taken 04/28/2023 0000 by Rudell Cobb, RN  Device Skin Pressure Protection: absorbent pad utilized/changed  Skin Protection: incontinence pads utilized  Taken 04/27/2023 2300 by Rudell Cobb, RN  Device Skin Pressure Protection: absorbent pad utilized/changed  Skin Protection: incontinence pads utilized  Taken 04/27/2023 2200 by Rudell Cobb, RN  Device Skin Pressure Protection: absorbent pad utilized/changed  Skin Protection: incontinence pads utilized  Taken 04/27/2023 2000 by Rudell Cobb, RN  Device Skin Pressure Protection: absorbent pad utilized/changed  Skin Protection: incontinence pads utilized     Problem: Rehabilitation (IRF) Plan of Care  Goal: Absence of New-Onset Illness or Injury  Intervention: Prevent VTE (Venous Thromboembolism)  Recent Flowsheet Documentation  Taken 04/28/2023 0200 by Rudell Cobb, RN  Anti-Embolism Device Status: (Eliquis) Other (Comment)  Taken 04/28/2023 0000 by Rudell Cobb, RN  Anti-Embolism Device Status: (Eliquis) Other (Comment)  Taken 04/27/2023  2300 by Rudell Cobb, RN  VTE Prevention/Management: anticoagulant therapy  Taken 04/27/2023 2200 by Rudell Cobb, RN  Anti-Embolism Device Status: (Eliquis) Other (Comment)  Taken 04/27/2023 2000 by Rudell Cobb, RN  Anti-Embolism Device Status: (Eliquis) Other (Comment)     Problem: Self-Care Deficit  Goal: Improved Ability to Complete Activities of Daily Living  Intervention: Promote Activity and Functional Independence  Recent Flowsheet Documentation  Taken 04/27/2023 2300 by Rudell Cobb, RN  Self-Care Promotion: independence encouraged     Problem: Fall Injury Risk  Goal: Absence of Fall and Fall-Related Injury  Intervention: Identify and Manage Contributors  Recent Flowsheet Documentation  Taken 04/27/2023 2300 by Rudell Cobb, RN  Self-Care Promotion: independence encouraged  Intervention: Promote Injury-Free Environment  Recent Flowsheet Documentation  Taken 04/28/2023 0000 by Rudell Cobb, RN  Safety Interventions: fall reduction program maintained  Taken 04/27/2023 2200 by Rudell Cobb, RN  Safety Interventions: fall reduction program maintained  Taken 04/27/2023 2000 by Rudell Cobb, RN  Safety Interventions: fall reduction program maintained

## 2023-04-28 NOTE — Unmapped (Signed)
 [facility] Consult Service   Initial Consultation         Assessment and Recommendations:   [name] [name] is a 23 y.o. 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 The patient is seen in consultation at the request of Ronelle Nigh, MD (Physical Medicine and Rehabilitation [facility])) for  multiple GI complaints .    Acute on chronic abdominal pain  Acute on chronic diarrhea  Acute on chronic bloating  Acute on chronic nausea  Patient with complex GI history who had been recovering well in acute physical rehab after prolonged hospitalization who now endorses 5 days of worsening chronic GI symptoms. Given patient's history, difficult to ascertain if these are expected symptoms and typical for this patient's clinical history or if she has new acute process ongoing. Currently, do not feel that patient has SBO or ileus given increased frequency of bowel movements. Will proceed with ruling out acute infection and make small adjustments to current medicine regimens to try and improve symptoms.  - please obtain cdiff study  - OK to hold psyllium fiber for 2-3 days to see how the change impacts abdominal pain and diarrhea   - pending above results will consider use of loperamide and possible EGD/pouchoscopy  - continue anti-emetics at current doses    Issues Impacting Complexity of Management:  - none    Recommendations discussed with the patient's primary team. We will continue to follow along with you.    I saw and evaluated the patient, participating in the key portions of the service.  I reviewed the resident???s note.  I agree with the resident???s findings and plan. I personally reviewed Xray images that show a few chronically dilated small bowel loops but no air fluid levels. Theadore Nan, MD, PhD         History of Present Illness:     14F w/ complex GI history now in physical rehab at [facility] after prolonged hospitalization w/ complaints of worsening chronic GI problems since last Thurs 2/13. Patient says on Thurs she noticed having increased bowel movement frequency with bright red blood in stool, nausea, abdominal bloating, and abdominal pain. Patient says she last felt these symptoms to be this bad around 1 mo ago. Of note patient was to be discharged today, however, given above symptoms she and her mother are worried that something could be untreated. Patient says that she has had to decrease her PO intake due to the nausea but she is forcing herself to take PO. She is tolerating NGT feeds, but had to have reduced volume and time on feeds. She says that since Thurs she has had 15-20 BM per day with accidental bowel movements, additionally she has periods of 4-5 hrs with no BM but increased abdominal pain. Normal BM per day for her is 5-7.    -I have reviewed the patient's prior records from [facility], [facility], [facility] as summarized in the HPI    Objective:   Temp:  [36.8 ??C (98.2 ??F)] 36.8 ??C (98.2 ??F)  Heart Rate:  [74] 74  Resp:  [18] 18  BP: (105)/(62) 105/62  SpO2:  [98 %] 98 %    Gen: WDWN female, answers questions appropriately, NGT in place  Abdomen: Soft, diffuse tenderness to palpation,  mild distension, no rebound/guarding, no hepatosplenomegaly. Surgical scars present and intact.   Extremities: No edema in the BLEs    Wt Readings from Last 12 Encounters:   04/06/23 54.4 kg (120 lb)   03/24/23 52.8 kg (116 lb 6.4 oz)   03/17/23 53.5 kg (118 lb)   02/24/23 54.1 kg (119 lb 4.8 oz)   01/23/23 57.4 kg (126 lb 9.6 oz)   01/21/23 58 kg (127 lb 12.8 oz)   01/14/23 58 kg (127 lb 12.8 oz)   12/16/22 58.5 kg (129 lb)   12/16/22 58.5 kg (129 lb)   12/08/22 61.3 kg (135 lb 3.2 oz)   11/14/22 61 kg (134 lb 8 oz)   09/24/22 62.4 kg (137 lb 9.6 oz)      Pertinent Labs/Studies:  -I have reviewed the patient's labs from 04/28/23 which show stable renal function (SCr, electrolytes)    Pouchoscopy biopsy:  A: Small bowel, pouch, biopsy  - Erosive moderately active chronic enteritis  - No CMV viral cytopathic effect, granuloma, or dysplasia identified     Pouchoscopy 1/3:   Intact pouch with minimal inflammation and several superficial ulcerations at the pouch anastamosis and pre-pouch inlet, suggestive of healing pouchitis.     Other scopes:  Pouch exam 07/2022 - normal ileum, rectal cuff appeared normal with the exception of small rectal polpys, which were treated with APC   Pouch exam 03/2022 - friable mucosa a ileoanal pouch suture line, treated with APC   Pouch exam 02/2022 - anal fissure, 10mm circumferential ulcer oozing at the ileocolonic anastomosis, treated with APC   Pouch exam 01/2022 - anal fissure, ulcer at ileocolonic anastomosis   EGD 01/2022 - multple polyps in the stomach, normal esophagus, duodenum and jejunum   EGD 08/2021- fundic gland polyps in the stomach, otherwise normal  EGD 2022- fundic gland polyps in the stomach, adenomatous tissue removed from small bowel on random biopsy to rule out celiac disease (none seen endoscopically)
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
## 2023-04-28 NOTE — Unmapped (Signed)
 Physical Medicine and Rehabilitation  Daily Progress Note [facility]    ASSESSMENT:     [name] [name] is a 23 y.o. female 23 y.o. female with PMH of [name] s/p proctocolectomy and J-pouch, desmoid tumours, chronic abdominal pain and nausea, anxiety, PTSD related to past medical care, DVT RUE s/p Eliquis x3 months admitted for constellation of worsening pain, intolerance of oral intake. She is now admitted to [facility] for comprehensive interdisciplinary rehabilitation.      Rehab Impairment Group Code [facility]):   (Debility) 16 Debility (Non-Cardiac/Non-Pulmonary)   Etiology: worsening pain, intolerance of oral intake     PLAN:     This patient is admitted to the Physical Medicine and Rehabilitation - Inpatient - A service from 8am-5pm on weekdays for questions regarding this patient. After hours, weekends, and holidays please contact the 1st Call resident pager     REHAB:   - PT and OT to maximize functional status with mobility and ADLs as well as prevention of joint contracture.   - P&O for assistive devices PRN.  - To be discussed in weekly Interdisciplinary Team Conference.     Acute on chronic abdominal pain and nausea  Complex situation discussed at length in prior notes. In brief, her pain and nausea are (deeply) multifactorial but consensus among treatment team is that new / unknown organic issue is unlikely. Plan to focus on symptom control, adequate nutrition.  Patient is currently at goal tube feeding, tolerating well.  No visible emesis noted.  Still reports persistent nausea but on aggressive nausea management as below. Chronic pain service consulted, appreciated.  - NO CHANGES to pain regimen without multiparty discussion including patient, unless true emergency.  Seen by chronic pain 1/28, recommended to continue the pain regimen as below no changes made.  - Pain management:  -- oxycodone (liquid) 20 mg q4 prn-first-line for pain  -- changed hydromorphone 1 mg IV q6prn -second line for pain  (ONLY IF inadequate relief 1 hour after oral alternative)  -- buprenorphine 20 mcg/h patch   -- hyoscyamine prn  -- Baclofen 5mg  TID  - Nausea: discuss about transitioning one of the IV nausea medication to p.o.  -- ondansetron IV q8 PRN  -- promethazine PO Q6H 12.5mg   -- scopolamine patch  -- olanzapine 5 mg in evening  -- no diphenhydramine IV in absence of clear indication  - Dyspepsia:  -- famotidine bid  -- PPI IV q12  - GI motility:  -- naloxegol prn  -- Simethicone 3 times daily  -- okay to use loperamide sparingly     Left arm PICC line site swelling, pain and ecchymosis - DVT  Left arm PICC line site noted mild swelling and mild ecchymosis from insertion.  No visible swelling noted below the elbow.  PVL confirmed acute DVT, started on anticoagulation and removed PICC line on 1/28, given acute DVT also patient is currently not on any IV antibiotic. Removed PICC line 1/28,  swelling around prior picc line site improving, mild redness/ rash  due to tape, still has bruises but resolving.   - Eliquis 5mg  BID; will need 3 months of therapy  - Stopped lovenox     Inadequate oral intake  Abnormal gut anatomy notwithstanding, it is the consensus among hospitalist and GI services that oral and/or enteral feeding should be sufficient to meet caloric and hydration needs. Has not developed refeeding syndrome. TPN will not be offered in forseeable future.  - cyclic feeding with Nutren 2.0, at goal rate  50 cc/h for 16 hours with free water flushes every 3 hours of 75cc.   - Calorie count per nutrition  [ ]  GI to follow and manage wean from enteral nutrition at discharge     Elevated transaminases  Improving: Labs with AST 21, ALT 70, downtrending from prior. No evidence of cholestasis or synthetic dysfunction. Presume drug-related. No longer following closely as trend is well established.     Pouchitis (dx 03/2023), resolving  No plan for repeat endoscopy or colonoscopy during this admission, per GI, given recent exam. No significant decrement in Hb so far. s/p Cefdinir 300 mg bid for 4 weeks (end = 04/07/2023)  - Cefdinir 300 mg daily (end = 05/05/2023)  - CBC weekly     Desmoid tumors  - okay to hold nirogesat while having difficulty with oral meds, no urgency to resume per Dr [name]     Mood / anxiety  - continue escitalopram  - lorazepam 1 mg bid prn (home medication)     Daily Checklist  - Diet: Regular diet with cyclic tube feeds  - DVT PPX: eliquis  - GI PPX: IV protonix BID  - Access: piv    DISPO: Patient to be discussed at weekly interdisciplinary team conference.   - EDD: 2/18  - Follow-up: PCP, PM&R, GI, Oncology    SUBJECTIVE:     Interval Events:  NAEO.  She continues to be bothered by frequent stooling, nausea increasing and inability to tolerate p.o.  We will reach out to our gastroenterology colleagues to determine if there is anything new or different they would like [name] to do differently for her care at this time.    OBJECTIVE:     Vital signs (last 24 hours):  Temp:  [36.8 ??C (98.2 ??F)] 36.8 ??C (98.2 ??F)  Heart Rate:  [68-74] 74  Resp:  [18] 18  BP: (105-115)/(60-62) 105/62  MAP (mmHg):  [76-77] 76  SpO2:  [97 %-98 %] 98 %    Intake/Output (last 3 shifts):  I/O last 3 completed shifts:  In: 1020 [P.O.:340; I.V.:20; NG/GT:660]  Out: -     Physical Exam:   GEN: NAD, sitting in manual wheelchair  EYES: sclera anicteric, conjunctiva clear   HEENT: MMM, NG tube in place  NECK: trachea midline  CV: warm extremities, well perfused, no cyanosis   RESP:  NWOB on RA  ABD: Not distended  SKIN: no visible masses, lesions, rashes, ecchymoses.   MSK: no notable contractures or ext swelling  NEURO: Alert, MAE, speech fluid and coherent, responds appropriately to questions   PSYCH: mood euthymic, affect appropriate, thought process logical         Medications:  Scheduled    acetaminophen (TYLENOL) oral liquid Q8H SCH    ammonium lactate (LAC-HYDRIN) 12 % lotion 1 Application BID    apixaban (ELIQUIS) tablet 5 mg BID baclofen (LIORESAL) tablet 5 mg TID    buprenorphine 20 mcg/hour transdermal patch 1 patch Weekly    cefdinir (OMNICEF) oral suspension Daily    escitalopram oxalate (LEXAPRO) tablet 5 mg Daily    famotidine (PEPCID) oral suspension Nightly    flu vaccine TS 2024-25(44mos up)(PF)(FLULAVAL, FLUARIX, FLUZONE) During hospitalization    fluticasone propionate (FLONASE) 50 mcg/actuation nasal spray 1 spray Weekly    lidocaine (ASPERCREME) 4 % 1 patch Daily    multivitamin with folic acid 400 mcg tablet 1 tablet Daily    OLANZapine zydis (ZYPREXA) disintegrating tablet 5 mg Q PM    pantoprazole (Protonix) injection  40 mg BID    promethazine (PHENERGAN) tablet 12.5 mg Q6H    psyllium (METAMUCIL) 3.4 gram packet 1 packet TID    scopolamine (TRANSDERM-SCOP) 1 mg over 3 days topical patch 1 mg Q72H    simethicone (MYLICON) chewable tablet 80 mg TID    sodium chloride (NS) 0.9 % flush 10 mL Q8H     PRN cetirizine, 20 mg, Daily PRN  hydrocortisone, , BID PRN  hydrocortisone, , BID PRN  HYDROmorphone, 1 mg, Q6H PRN  hyoscyamine, 125 mcg, Q4H PRN  loperamide, 2 mg, QID PRN  LORazepam, 1 mg, BID PRN  melatonin, 3 mg, Nightly PRN  naloxegol, 12.5 mg, Daily PRN  ondansetron, 8 mg, Q8H PRN  oxyCODONE, 20 mg, Q4H PRN  zinc oxide-cod liver oil, , Daily PRN          Labs/Studies: Reviewed     Radiology Results:   XR Abdomen 1 View  Result Date: 04/27/2023  EXAM: XR ABDOMEN 1 VIEW ACCESSION: [id number] UN REPORT DATE: 04/27/2023 11:18 AM     CLINICAL INDICATION: 23 years old with BLOOD IN STOOL      COMPARISON: 04/20/2023     TECHNIQUE: Supine view of the abdomen, 2 image(s)     FINDINGS:     Weighted enteric tube tip projects over the third portion of the duodenum. Unchanged multiple mild dilated loops of small bowel are again noted. Punctate calcific density overlies the right renal shadow which is nonspecific but may represent a tiny renal stone.     Right upper and left upper quadrant surgical clips. Sutures overlie the lower abdomen/pelvis.     Lung bases are clear. No acute osseous abnormality.         Weighted enteric tube tip projects over the third portion of the duodenum. Unchanged multiple mildly dilated loops of small bowel are again noted, similar to the prior.            Quality Indicators      Hearing, Speech, and Vision  Ability to Hear: Adequate  Ability to See in Adequate Light: Adequate  Expression of Ideas and Wants: Without difficulty  Understanding Verbal and Non-Verbal Content: Understands    Cognitive Pattern Assessment  Cognitive Pattern Assessment Used: BIMS  Brief Interview for Mental Status (BIMS)  Repetition of Three Words (First Attempt): 3  Temporal Orientation: Year: Correct  Temporal Orientation: Month: Accurate within 5 days  Temporal Orientation: Day: Correct  Recall: Sock: Yes, no cue required  Recall: Blue: Yes, no cue required  Recall: Bed: Yes, no cue required  BIMS Summary Score: 15       Nutritional Approaches  Nutritional Approach: Feeding tube    ADLs  Admission Current   Eating Assistance Needed: Set-up / clean-up    CARE Score - 5 Assistance Needed: Independent    CARE Score - 6   Oral Hygiene  Assistance Needed: Physical assistance Total assistance  CARE Score - 1  Assistance Needed: Independent    CARE Score - 6   Bladder Continence       Bowel Continence       Toileting Hygiene Assistance Needed: Incidental touching    CARE Score - 4 Assistance Needed: Incidental touching    CARE Score - 4    Toilet Transfer Assistance Needed: Physical assistance 25% or less  CARE Score - 3 Assistance Needed: Incidental touching    CARE Score - 4   Shower/Bathe Self Assistance Needed: Set-up / clean-up    CARE Score -  88  Assistance Needed: Set-up / clean-up    CARE Score - 5   Upper Body Dressing Assistance Needed: Physical assistance 26%-50%  CARE Score - 3  Assistance Needed: Independent    CARE Score - 6   Lower Body Dressing Assistance Needed: Physical assistance 76% or more  CARE Score - 2 Assistance Needed: Incidental touching    CARE Score - 4   On/Off Footwear Assistance Needed: Physical assistance Total assistance  CARE Score - 1  Assistance Needed: Independent    CARE Score - 6         Transfers Admission Current   Bed to Chair Assistance Needed: Incidental touching    CARE Score - 4  Assistance Needed: Adaptive equipment, Independent    CARE Score - 6     Lying to Sitting Assistance Needed: Physical assistance 26%-50%  CARE Score - 3  Assistance Needed: Adaptive equipment, Independent    CARE Score - 6   Roll Left/Right Assistance Needed: Physical assistance 25% or less  CARE Score - 3  Assistance Needed: Independent    CARE Score - 6   Sit to Lying Assistance Needed: Physical assistance 26%-50%  CARE Score - 3  Assistance Needed: Independent, Adaptive equipment    CARE Score - 6   Sit to Stand Assistance Needed: Incidental touching    CARE Score - 4  Assistance Needed: Adaptive equipment, Independent    CARE Score - 6         Mobility Admission Current   Walk 10 Feet Assistance Needed: Adaptive equipment, Physical assistance 25% or less  CARE Score - 3 Assistance Needed: Adaptive equipment, Independent    CARE Score - 6     Walk 50 Feet 2 Turns Assistance Needed: Physical assistance 51%-75%  CARE Score - 2 Assistance Needed: Set-up / clean-up, Adaptive equipment    CARE Score - 5   Walk 150 Feet Assistance Needed: Set-up / clean-up, Adaptive equipment    CARE Score - 88  Assistance Needed: Set-up / clean-up, Adaptive equipment    CARE Score - 5   Walk 10 Feet Uneven Assistance Needed: Set-up / clean-up, Adaptive equipment    CARE Score - 88  Assistance Needed: Set-up / clean-up, Adaptive equipment    CARE Score - 5   1 Step (Curb) Assistance Needed: Physical assistance, Adaptive equipment 25% or less  CARE Score - 88  Assistance Needed: Set-up / clean-up, Adaptive equipment    CARE Score - 5   4 Steps Assistance Needed: Incidental touching, Adaptive equipment    CARE Score - 88  Assistance Needed: Incidental touching, Adaptive equipment    CARE Score - 4   12 Steps      CARE Score - 88       CARE Score - 88   Picking Up Object Assistance Needed: Adaptive equipment, Set-up / clean-up    CARE Score - 10  Assistance Needed: Adaptive equipment, Set-up / clean-up    CARE Score - 5   Wheelchair/Scooter Use        Wheel 50 Feet 2 Turns Assistance Needed: Independent      CARE Score - Wheel 50 Feet with Two Turns: 6    Type of Wheelchair/Scooter: Manual Assistance Needed: Independent      CARE Score - Wheel 50 Feet with Two Turns: 6    Type of Wheelchair/Scooter: Manual   Wheel 150 Feet Assistance Needed: Physical assistance 26%-50%    CARE Score - Wheel 150 Feet: 3  Type of Wheelchair/Scooter: Manual  Assistance Needed: Independent      CARE Score - Wheel 150 Feet: 6    Type of Wheelchair/Scooter: Manual

## 2023-04-29 ENCOUNTER — Ambulatory Visit: Admit: 2023-04-29 | Payer: BLUE CROSS/BLUE SHIELD | Attending: Psychologist | Primary: Psychologist

## 2023-04-29 LAB — BASIC METABOLIC PANEL
ANION GAP: 12 mmol/L (ref 5–14)
BLOOD UREA NITROGEN: 11 mg/dL (ref 9–23)
BUN / CREAT RATIO: 20
CALCIUM: 9.8 mg/dL (ref 8.7–10.4)
CHLORIDE: 101 mmol/L (ref 98–107)
CO2: 29.9 mmol/L (ref 20.0–31.0)
CREATININE: 0.56 mg/dL (ref 0.55–1.02)
EGFR CKD-EPI (2021) FEMALE: >90 mL/min/{1.73_m2} (ref >=60)
GLUCOSE RANDOM: 90 mg/dL (ref 70–179)
POTASSIUM: 4.1 mmol/L (ref 3.4–4.8)
SODIUM: 143 mmol/L (ref 135–145)

## 2023-04-29 LAB — CBC
HEMATOCRIT: 33.8 % — ABNORMAL LOW (ref 34.0–44.0)
HEMOGLOBIN: 11.3 g/dL (ref 11.3–14.9)
MEAN CORPUSCULAR HEMOGLOBIN CONC: 33.4 g/dL (ref 32.0–36.0)
MEAN CORPUSCULAR HEMOGLOBIN: 28.7 pg (ref 25.9–32.4)
MEAN CORPUSCULAR VOLUME: 85.9 fL (ref 77.6–95.7)
MEAN PLATELET VOLUME: 9.3 fL (ref 6.8–10.7)
PLATELET COUNT: 240 10*9/L (ref 150–450)
RED BLOOD CELL COUNT: 3.94 10*12/L — ABNORMAL LOW (ref 3.95–5.13)
RED CELL DISTRIBUTION WIDTH: 17.2 % — ABNORMAL HIGH (ref 12.2–15.2)
WBC ADJUSTED: 8.7 10*9/L (ref 3.6–11.2)

## 2023-04-29 LAB — PHOSPHORUS: PHOSPHORUS: 5.3 mg/dL — ABNORMAL HIGH (ref 2.4–5.1)

## 2023-04-29 LAB — MAGNESIUM: MAGNESIUM: 2 mg/dL (ref 1.6–2.6)

## 2023-04-29 MED ADMIN — pantoprazole (Protonix) injection 40 mg: 40 mg | INTRAVENOUS | @ 14:00:00

## 2023-04-29 MED ADMIN — promethazine (PHENERGAN) tablet 12.5 mg: 12.5 mg | ORAL | @ 22:00:00

## 2023-04-29 MED ADMIN — oxyCODONE (ROXICODONE) 5 mg/5 mL solution 20 mg: 20 mg | ORAL | @ 11:00:00 | Stop: 2023-05-05

## 2023-04-29 MED ADMIN — apixaban (ELIQUIS) tablet 5 mg: 5 mg | ORAL | @ 14:00:00

## 2023-04-29 MED ADMIN — cefdinir (OMNICEF) oral suspension: 300 mg | ORAL | @ 14:00:00 | Stop: 2023-05-06

## 2023-04-29 MED ADMIN — promethazine (PHENERGAN) tablet 12.5 mg: 12.5 mg | ORAL | @ 14:00:00

## 2023-04-29 MED ADMIN — HYDROmorphone (PF) (DILAUDID) injection 1 mg: 1 mg | INTRAVENOUS | @ 17:00:00 | Stop: 2023-05-05

## 2023-04-29 MED ADMIN — oxyCODONE (ROXICODONE) 5 mg/5 mL solution 20 mg: 20 mg | ORAL | @ 03:00:00 | Stop: 2023-05-05

## 2023-04-29 MED ADMIN — oxyCODONE (ROXICODONE) 5 mg/5 mL solution 20 mg: 20 mg | ORAL | @ 15:00:00 | Stop: 2023-05-05

## 2023-04-29 MED ADMIN — oxyCODONE (ROXICODONE) 5 mg/5 mL solution 20 mg: 20 mg | ORAL | @ 07:00:00 | Stop: 2023-05-05

## 2023-04-29 MED ADMIN — escitalopram oxalate (LEXAPRO) tablet 5 mg: 5 mg | ENTERAL | @ 14:00:00

## 2023-04-29 MED ADMIN — famotidine (PEPCID) oral suspension: 20 mg | ENTERAL | @ 03:00:00

## 2023-04-29 MED ADMIN — HYDROmorphone (PF) (DILAUDID) injection 1 mg: 1 mg | INTRAVENOUS | Stop: 2023-05-05

## 2023-04-29 MED ADMIN — acetaminophen (TYLENOL) oral liquid: 1000 mg | ENTERAL | @ 19:00:00

## 2023-04-29 MED ADMIN — simethicone (MYLICON) chewable tablet 80 mg: 80 mg | ORAL | @ 17:00:00

## 2023-04-29 MED ADMIN — simethicone (MYLICON) chewable tablet 80 mg: 80 mg | ORAL | @ 11:00:00

## 2023-04-29 MED ADMIN — lidocaine (ASPERCREME) 4 % 1 patch: 1 | TRANSDERMAL | @ 14:00:00

## 2023-04-29 MED ADMIN — sodium chloride (NS) 0.9 % flush 10 mL: 10 mL | INTRAVENOUS | @ 19:00:00

## 2023-04-29 MED ADMIN — ondansetron (ZOFRAN) injection 8 mg: 8 mg | INTRAVENOUS | @ 06:00:00

## 2023-04-29 MED ADMIN — vancomycin (FIRVANQ) (50 mg/mL) oral solution: 125 mg | ORAL | @ 19:00:00 | Stop: 2023-05-13

## 2023-04-29 MED ADMIN — ondansetron (ZOFRAN) injection 8 mg: 8 mg | INTRAVENOUS

## 2023-04-29 MED ADMIN — promethazine (PHENERGAN) tablet 12.5 mg: 12.5 mg | ORAL | @ 03:00:00

## 2023-04-29 MED ADMIN — promethazine (PHENERGAN) tablet 12.5 mg: 12.5 mg | ORAL | @ 11:00:00

## 2023-04-29 MED ADMIN — ondansetron (ZOFRAN) injection 8 mg: 8 mg | INTRAVENOUS | @ 14:00:00

## 2023-04-29 MED ADMIN — acetaminophen (TYLENOL) oral liquid: 1000 mg | ENTERAL | @ 03:00:00

## 2023-04-29 MED ADMIN — multivitamin with folic acid 400 mcg tablet 1 tablet: 1 | ORAL | @ 14:00:00

## 2023-04-29 MED ADMIN — pantoprazole (Protonix) injection 40 mg: 40 mg | INTRAVENOUS | @ 03:00:00

## 2023-04-29 MED ADMIN — sodium chloride (NS) 0.9 % flush 10 mL: 10 mL | INTRAVENOUS | @ 12:00:00

## 2023-04-29 MED ADMIN — baclofen (LIORESAL) tablet 5 mg: 5 mg | ORAL | @ 03:00:00

## 2023-04-29 MED ADMIN — baclofen (LIORESAL) tablet 5 mg: 5 mg | ORAL | @ 19:00:00

## 2023-04-29 MED ADMIN — sodium chloride (NS) 0.9 % flush 10 mL: 10 mL | INTRAVENOUS | @ 03:00:00

## 2023-04-29 MED ADMIN — simethicone (MYLICON) chewable tablet 80 mg: 80 mg | ORAL | @ 23:00:00

## 2023-04-29 MED ADMIN — HYDROmorphone (PF) (DILAUDID) injection 1 mg: 1 mg | INTRAVENOUS | @ 06:00:00 | Stop: 2023-05-05

## 2023-04-29 MED ADMIN — acetaminophen (TYLENOL) oral liquid: 1000 mg | ENTERAL | @ 11:00:00

## 2023-04-29 MED ADMIN — fluticasone propionate (FLONASE) 50 mcg/actuation nasal spray 1 spray: 1 | TOPICAL | @ 15:00:00

## 2023-04-29 MED ADMIN — HYDROmorphone (PF) (DILAUDID) injection 1 mg: 1 mg | INTRAVENOUS | @ 11:00:00 | Stop: 2023-05-05

## 2023-04-29 MED ADMIN — apixaban (ELIQUIS) tablet 5 mg: 5 mg | ORAL | @ 03:00:00

## 2023-04-29 MED ADMIN — oxyCODONE (ROXICODONE) 5 mg/5 mL solution 20 mg: 20 mg | ORAL | @ 22:00:00 | Stop: 2023-05-05

## 2023-04-29 MED ADMIN — ammonium lactate (LAC-HYDRIN) 12 % lotion 1 Application: 1 | TOPICAL | @ 14:00:00

## 2023-04-29 MED ADMIN — LORazepam (ATIVAN) tablet 1 mg: 1 mg | ORAL | @ 19:00:00

## 2023-04-29 MED ADMIN — baclofen (LIORESAL) tablet 5 mg: 5 mg | ORAL | @ 14:00:00

## 2023-04-29 MED ADMIN — buprenorphine 20 mcg/hour transdermal patch 1 patch: 1 | TRANSDERMAL | @ 15:00:00

## 2023-04-29 NOTE — Unmapped (Signed)
 Patient was scheduled for a virtual visit with Pain Psychology today, 04/29/23. She is currently inpatient and the plan was to complete the virtual visit as schedule. Patient did not respond to the text prompt and did not log onto mychart for the visit. Visit was not completed.

## 2023-04-29 NOTE — Unmapped (Signed)
 Patient is alert, oriented, not in distress; able to take pills whole; on tube feeding; PIV x 2, intact; patient able to ambulate using rollator walker; continent in urine and bowel; complained of pain, PRN Oxycodone and Dilaudid given; Pt verbalized having intermittent hives, and new skin bumps on her right clavicle proximal to her neck; Monitored further. Provided call bell within reach, bed alarms on.    Problem: Rehabilitation (IRF) Plan of Care  Goal: Plan of Care Review  Outcome: Progressing  Goal: Patient-Specific Goal (Individualized)  Outcome: Progressing  Goal: Absence of New-Onset Illness or Injury  Outcome: Progressing  Intervention: Prevent Fall and Fall Injury  Recent Flowsheet Documentation  Taken 04/29/2023 0200 by Rickard Patience, RN  Safety Interventions:   fall reduction program maintained   low bed  Taken 04/29/2023 0000 by Rickard Patience, RN  Safety Interventions:   fall reduction program maintained   low bed  Taken 04/28/2023 2200 by Rickard Patience, RN  Safety Interventions:   fall reduction program maintained   low bed  Taken 04/28/2023 2000 by Rickard Patience, RN  Safety Interventions:   fall reduction program maintained   low bed  Intervention: Prevent Skin Injury  Recent Flowsheet Documentation  Taken 04/28/2023 2000 by Rickard Patience, RN  Skin Protection: incontinence pads utilized  Intervention: Prevent Infection  Recent Flowsheet Documentation  Taken 04/29/2023 0200 by Rickard Patience, RN  Infection Prevention:   hand hygiene promoted   single patient room provided  Taken 04/29/2023 0000 by Rickard Patience, RN  Infection Prevention:   hand hygiene promoted   single patient room provided  Taken 04/28/2023 2200 by Rickard Patience, RN  Infection Prevention:   hand hygiene promoted   single patient room provided  Taken 04/28/2023 2000 by Rickard Patience, RN  Infection Prevention:   hand hygiene promoted   single patient room provided  Goal: Optimal Comfort and Wellbeing  Outcome: Progressing  Goal: Home and Community Transition Plan Established  Outcome: Progressing  Goal: Rounds/Family Conference  Outcome: Progressing     Problem: Rehabilitation (IRF) Plan of Care  Goal: Plan of Care Review  Outcome: Progressing  Goal: Patient-Specific Goal (Individualized)  Outcome: Progressing  Goal: Absence of New-Onset Illness or Injury  Outcome: Progressing  Intervention: Prevent Fall and Fall Injury  Recent Flowsheet Documentation  Taken 04/29/2023 0200 by Rickard Patience, RN  Safety Interventions:   fall reduction program maintained   low bed  Taken 04/29/2023 0000 by Rickard Patience, RN  Safety Interventions:   fall reduction program maintained   low bed  Taken 04/28/2023 2200 by Rickard Patience, RN  Safety Interventions:   fall reduction program maintained   low bed  Taken 04/28/2023 2000 by Rickard Patience, RN  Safety Interventions:   fall reduction program maintained   low bed  Intervention: Prevent Skin Injury  Recent Flowsheet Documentation  Taken 04/28/2023 2000 by Rickard Patience, RN  Skin Protection: incontinence pads utilized  Intervention: Prevent Infection  Recent Flowsheet Documentation  Taken 04/29/2023 0200 by Rickard Patience, RN  Infection Prevention:   hand hygiene promoted   single patient room provided  Taken 04/29/2023 0000 by Rickard Patience, RN  Infection Prevention:   hand hygiene promoted   single patient room provided  Taken 04/28/2023 2200 by Rickard Patience, RN  Infection Prevention:   hand hygiene promoted   single patient room provided  Taken 04/28/2023 2000 by Rickard Patience, RN  Infection Prevention:   hand hygiene promoted   single patient room provided  Goal: Optimal Comfort and Wellbeing  Outcome: Progressing  Goal: Home and Community Transition Plan Established  Outcome: Progressing  Goal: Rounds/Family Conference  Outcome: Progressing

## 2023-04-29 NOTE — Unmapped (Signed)
 Can stand pivot to wheelchair. Medications crushed and administered through NG tube. Prn Oxycodone and Dilaudid given for 9/10 pain in abdomen. Enteric precautions for C.diff infection. Ativan given once this shift for anxiety. Call light and beside table within reach. Mother remains at bedside and is involved in care.      Problem: Rehabilitation (IRF) Plan of Care  Goal: Plan of Care Review  Outcome: Progressing  Goal: Patient-Specific Goal (Individualized)  Outcome: Progressing  Goal: Absence of New-Onset Illness or Injury  Outcome: Progressing  Intervention: Prevent Fall and Fall Injury  Recent Flowsheet Documentation  Taken 04/29/2023 1200 by Charlyne Quale, RN  Safety Interventions: fall reduction program maintained  Taken 04/29/2023 1000 by Charlyne Quale, RN  Safety Interventions: fall reduction program maintained  Taken 04/29/2023 0800 by Charlyne Quale, RN  Safety Interventions: fall reduction program maintained  Taken 04/29/2023 0705 by Charlyne Quale, RN  Safety Interventions: fall reduction program maintained  Intervention: Prevent Skin Injury  Recent Flowsheet Documentation  Taken 04/29/2023 1200 by Charlyne Quale, RN  Skin Protection: incontinence pads utilized  Taken 04/29/2023 1000 by Charlyne Quale, RN  Skin Protection: incontinence pads utilized  Taken 04/29/2023 0800 by Charlyne Quale, RN  Skin Protection: incontinence pads utilized  Taken 04/29/2023 0705 by Charlyne Quale, RN  Skin Protection: incontinence pads utilized  Goal: Optimal Comfort and Wellbeing  Outcome: Progressing  Goal: Home and Community Transition Plan Established  Outcome: Progressing  Goal: Rounds/Family Conference  Outcome: Progressing     Problem: Latex Allergy  Goal: Absence of Allergy Symptoms  Outcome: Progressing     Problem: Self-Care Deficit  Goal: Improved Ability to Complete Activities of Daily Living  Outcome: Progressing     Problem: Fall Injury Risk  Goal: Absence of Fall and Fall-Related Injury  Outcome: Progressing  Intervention: Promote Scientist, clinical (histocompatibility and immunogenetics) Documentation  Taken 04/29/2023 1200 by Charlyne Quale, RN  Safety Interventions: fall reduction program maintained  Taken 04/29/2023 1000 by Charlyne Quale, RN  Safety Interventions: fall reduction program maintained  Taken 04/29/2023 0800 by Charlyne Quale, RN  Safety Interventions: fall reduction program maintained  Taken 04/29/2023 0705 by Charlyne Quale, RN  Safety Interventions: fall reduction program maintained     Problem: Malnutrition  Goal: Improved Nutritional Intake  Outcome: Progressing     Problem: Infection  Goal: Absence of Infection Signs and Symptoms  Outcome: Progressing

## 2023-04-29 NOTE — Unmapped (Signed)
 Physical Medicine and Rehabilitation  Daily Progress Note [facility]    ASSESSMENT:     [name] [name] is a 23 y.o. female 23 y.o. female with PMH of [name] s/p proctocolectomy and J-pouch, desmoid tumours, chronic abdominal pain and nausea, anxiety, PTSD related to past medical care, DVT RUE s/p Eliquis x3 months admitted for constellation of worsening pain, intolerance of oral intake. She is now admitted to [facility] for comprehensive interdisciplinary rehabilitation.      Rehab Impairment Group Code [facility]):   (Debility) 16 Debility (Non-Cardiac/Non-Pulmonary)   Etiology: worsening pain, intolerance of oral intake     PLAN:     This patient is admitted to the Physical Medicine and Rehabilitation - Inpatient - A service from 8am-5pm on weekdays for questions regarding this patient. After hours, weekends, and holidays please contact the 1st Call resident pager     REHAB:   - PT and OT to maximize functional status with mobility and ADLs as well as prevention of joint contracture.   - P&O for assistive devices PRN.  - To be discussed in weekly Interdisciplinary Team Conference.     Acute on chronic abdominal pain and nausea  Complex situation discussed at length in prior notes. In brief, her pain and nausea are (deeply) multifactorial but consensus among treatment team is that new / unknown organic issue is unlikely. Plan to focus on symptom control, adequate nutrition.  Patient is currently at goal tube feeding, tolerating well.  No visible emesis noted.  Still reports persistent nausea but on aggressive nausea management as below. Chronic pain service consulted, appreciated.  - NO CHANGES to pain regimen without multiparty discussion including patient, unless true emergency.  Seen by chronic pain 1/28, recommended to continue the pain regimen as below no changes made.  - Pain management:  -- oxycodone (liquid) 20 mg q4 prn-first-line for pain  -- changed hydromorphone 1 mg IV q6prn -second line for pain  (ONLY IF inadequate relief 1 hour after oral alternative)  -- buprenorphine 20 mcg/h patch   -- hyoscyamine prn  -- Baclofen 5mg  TID  - Nausea: discuss about transitioning one of the IV nausea medication to p.o.  -- ondansetron IV q8 PRN  -- promethazine PO Q6H 12.5mg   -- scopolamine patch  -- olanzapine 5 mg in evening  -- no diphenhydramine IV in absence of clear indication  - Dyspepsia:  -- famotidine bid  -- PPI IV q12  - GI motility:  -- naloxegol prn  -- Simethicone 3 times daily  -- okay to use loperamide sparingly  -- C diff positive    C diff infection  C diff assay positive on 2/19. GI recommending oral vanc for 14 days.  - Vancomycin 125mg  4 times daily for 14 days (2/19-)     Left arm PICC line site swelling, pain and ecchymosis - DVT  Left arm PICC line site noted mild swelling and mild ecchymosis from insertion.  No visible swelling noted below the elbow.  PVL confirmed acute DVT, started on anticoagulation and removed PICC line on 1/28, given acute DVT also patient is currently not on any IV antibiotic. Removed PICC line 1/28,  swelling around prior picc line site improving, mild redness/ rash  due to tape, still has bruises but resolving.   - Eliquis 5mg  BID; will need 3 months of therapy     Inadequate oral intake  Abnormal gut anatomy notwithstanding, it is the consensus among hospitalist and GI services that oral and/or enteral feeding  should be sufficient to meet caloric and hydration needs. Has not developed refeeding syndrome. TPN will not be offered in forseeable future.  - cyclic feeding with Nutren 2.0, at goal rate 50 cc/h for 16 hours with free water flushes every 3 hours of 75cc.   - Calorie count per nutrition  [ ]  GI to follow and manage wean from enteral nutrition at discharge     Elevated transaminases  Improving: Labs with AST 21, ALT 70, downtrending from prior. No evidence of cholestasis or synthetic dysfunction. Presume drug-related. No longer following closely as trend is well established.     Pouchitis (dx 03/2023), resolving  No plan for repeat endoscopy or colonoscopy during this admission, per GI, given recent exam. No significant decrement in Hb so far. s/p Cefdinir 300 mg bid for 4 weeks (end = 04/07/2023)  - Cefdinir 300 mg daily (end = 05/05/2023)  - CBC weekly     Desmoid tumors  - okay to hold nirogesat while having difficulty with oral meds, no urgency to resume per Dr [name]     Mood / anxiety  - continue escitalopram  - lorazepam 1 mg bid prn (home medication)     Daily Checklist  - Diet: Regular diet with cyclic tube feeds  - DVT PPX: eliquis  - GI PPX: IV protonix BID  - Access: piv    DISPO: Patient to be discussed at weekly interdisciplinary team conference.   - EDD: 2/21  - Follow-up: PCP, PM&R, GI, Oncology    SUBJECTIVE:     Interval Events:  NAEO.  GI recommended a C diff assay  which has come back positive. We will proceed with treatment for 14 days and enteric precautions. She continues to report pain and frequent stooling. We are holding the metamucil at [facility] request as well. She continues to have intermittent abdominal pain.     OBJECTIVE:     Vital signs (last 24 hours):  Temp:  [36.8 ??C (98.2 ??F)] 36.8 ??C (98.2 ??F)  Heart Rate:  [79-93] 93  Resp:  [18] 18  BP: (102-104)/(60-63) 104/60  MAP (mmHg):  [74-75] 74  SpO2:  [99 %-100 %] 100 %    Intake/Output (last 3 shifts):  I/O last 3 completed shifts:  In: 1845 [P.O.:150; I.V.:10; NG/GT:1685]  Out: 0     Physical Exam:   GEN: NAD, sitting up in bed  EYES: sclera anicteric, conjunctiva clear   HEENT: MMM, NG tube in place  NECK: trachea midline  CV: warm extremities, well perfused, no cyanosis   RESP:  NWOB on RA  ABD: Not distended  SKIN: no visible masses, lesions, rashes, ecchymoses.   MSK: no notable contractures or ext swelling  NEURO: Alert, MAE, speech fluid and coherent, responds appropriately to questions   PSYCH: mood euthymic, affect appropriate, thought process logical         Medications:  Scheduled acetaminophen (TYLENOL) oral liquid Q8H SCH    ammonium lactate (LAC-HYDRIN) 12 % lotion 1 Application BID    apixaban (ELIQUIS) tablet 5 mg BID    baclofen (LIORESAL) tablet 5 mg TID    buprenorphine 20 mcg/hour transdermal patch 1 patch Weekly    cefdinir (OMNICEF) oral suspension Daily    escitalopram oxalate (LEXAPRO) tablet 5 mg Daily    famotidine (PEPCID) oral suspension Nightly    flu vaccine TS 2024-25(73mos up)(PF)(FLULAVAL, FLUARIX, FLUZONE) During hospitalization    fluticasone propionate (FLONASE) 50 mcg/actuation nasal spray 1 spray Weekly    lidocaine (ASPERCREME) 4 %  1 patch Daily    multivitamin with folic acid 400 mcg tablet 1 tablet Daily    OLANZapine zydis (ZYPREXA) disintegrating tablet 5 mg Q PM    pantoprazole (Protonix) injection 40 mg BID    promethazine (PHENERGAN) tablet 12.5 mg Q6H    [Provider Hold] psyllium (METAMUCIL) 3.4 gram packet 1 packet TID    scopolamine (TRANSDERM-SCOP) 1 mg over 3 days topical patch 1 mg Q72H    simethicone (MYLICON) chewable tablet 80 mg TID    sodium chloride (NS) 0.9 % flush 10 mL Q8H     PRN cetirizine, 20 mg, Daily PRN  hydrocortisone, , BID PRN  hydrocortisone, , BID PRN  HYDROmorphone, 1 mg, Q6H PRN  hyoscyamine, 125 mcg, Q4H PRN  loperamide, 2 mg, QID PRN  LORazepam, 1 mg, BID PRN  melatonin, 3 mg, Nightly PRN  naloxegol, 12.5 mg, Daily PRN  ondansetron, 8 mg, Q8H PRN  oxyCODONE, 20 mg, Q4H PRN  zinc oxide-cod liver oil, , Daily PRN          Labs/Studies: Reviewed     Radiology Results:   No results found.            Quality Indicators      Hearing, Speech, and Vision  Ability to Hear: Adequate  Ability to See in Adequate Light: Adequate  Expression of Ideas and Wants: Without difficulty  Understanding Verbal and Non-Verbal Content: Understands    Cognitive Pattern Assessment  Cognitive Pattern Assessment Used: BIMS  Brief Interview for Mental Status (BIMS)  Repetition of Three Words (First Attempt): 3  Temporal Orientation: Year: Correct  Temporal Orientation: Month: Accurate within 5 days  Temporal Orientation: Day: Correct  Recall: Sock: Yes, no cue required  Recall: Blue: Yes, no cue required  Recall: Bed: Yes, no cue required  BIMS Summary Score: 15       Nutritional Approaches  Nutritional Approach: Feeding tube    ADLs  Admission Current   Eating Assistance Needed: Set-up / clean-up    CARE Score - 5 Assistance Needed: Independent    CARE Score - 6   Oral Hygiene  Assistance Needed: Physical assistance Total assistance  CARE Score - 1  Assistance Needed: Independent    CARE Score - 6   Bladder Continence       Bowel Continence       Toileting Hygiene Assistance Needed: Incidental touching    CARE Score - 4 Assistance Needed: Independent    CARE Score - 6    Toilet Transfer Assistance Needed: Physical assistance 25% or less  CARE Score - 3 Assistance Needed: Supervision    CARE Score - 4   Shower/Bathe Self Assistance Needed: Set-up / clean-up    CARE Score - 88  Assistance Needed: Independent    CARE Score - 6   Upper Body Dressing Assistance Needed: Physical assistance 26%-50%  CARE Score - 3  Assistance Needed: Independent    CARE Score - 6   Lower Body Dressing Assistance Needed: Physical assistance 76% or more  CARE Score - 2  Assistance Needed: Independent    CARE Score - 6   On/Off Footwear Assistance Needed: Physical assistance Total assistance  CARE Score - 1  Assistance Needed: Independent    CARE Score - 6         Transfers Admission Current   Bed to Chair Assistance Needed: Incidental touching    CARE Score - 4  Assistance Needed: Adaptive equipment, Independent    CARE Score -  6     Lying to Sitting Assistance Needed: Physical assistance 26%-50%  CARE Score - 3  Assistance Needed: Adaptive equipment, Independent    CARE Score - 6   Roll Left/Right Assistance Needed: Physical assistance 25% or less  CARE Score - 3  Assistance Needed: Independent    CARE Score - 6   Sit to Lying Assistance Needed: Physical assistance 26%-50%  CARE Score - 3  Assistance Needed: Independent, Adaptive equipment    CARE Score - 6   Sit to Stand Assistance Needed: Incidental touching    CARE Score - 4  Assistance Needed: Adaptive equipment, Independent    CARE Score - 6         Mobility Admission Current   Walk 10 Feet Assistance Needed: Adaptive equipment, Physical assistance 25% or less  CARE Score - 3 Assistance Needed: Adaptive equipment, Independent    CARE Score - 6     Walk 50 Feet 2 Turns Assistance Needed: Physical assistance 51%-75%  CARE Score - 2 Assistance Needed: Set-up / clean-up, Adaptive equipment    CARE Score - 5   Walk 150 Feet Assistance Needed: Set-up / clean-up, Adaptive equipment    CARE Score - 88  Assistance Needed: Set-up / clean-up, Adaptive equipment    CARE Score - 5   Walk 10 Feet Uneven Assistance Needed: Set-up / clean-up, Adaptive equipment    CARE Score - 88  Assistance Needed: Set-up / clean-up, Adaptive equipment    CARE Score - 5   1 Step (Curb) Assistance Needed: Physical assistance, Adaptive equipment 25% or less  CARE Score - 88  Assistance Needed: Set-up / clean-up, Adaptive equipment    CARE Score - 5   4 Steps Assistance Needed: Incidental touching, Adaptive equipment    CARE Score - 88  Assistance Needed: Incidental touching, Adaptive equipment    CARE Score - 4   12 Steps      CARE Score - 88       CARE Score - 88   Picking Up Object Assistance Needed: Adaptive equipment, Set-up / clean-up    CARE Score - 10  Assistance Needed: Adaptive equipment, Set-up / clean-up    CARE Score - 5   Wheelchair/Scooter Use        Wheel 50 Feet 2 Turns Assistance Needed: Independent      CARE Score - Wheel 50 Feet with Two Turns: 6    Type of Wheelchair/Scooter: Manual Assistance Needed: Independent      CARE Score - Wheel 50 Feet with Two Turns: 6    Type of Wheelchair/Scooter: Manual   Wheel 150 Feet Assistance Needed: Physical assistance 26%-50%    CARE Score - Wheel 150 Feet: 3    Type of Wheelchair/Scooter: Manual  Assistance Needed: Independent      CARE Score - Wheel 150 Feet: 6    Type of Wheelchair/Scooter: Manual

## 2023-04-30 MED ADMIN — HYDROmorphone (PF) (DILAUDID) injection 1 mg: 1 mg | INTRAVENOUS | @ 07:00:00 | Stop: 2023-05-05

## 2023-04-30 MED ADMIN — sodium chloride (NS) 0.9 % flush 10 mL: 10 mL | INTRAVENOUS | @ 02:00:00

## 2023-04-30 MED ADMIN — vancomycin (FIRVANQ) (50 mg/mL) oral solution: 125 mg | ORAL | @ 19:00:00 | Stop: 2023-05-13

## 2023-04-30 MED ADMIN — oxyCODONE (ROXICODONE) 5 mg/5 mL solution 20 mg: 20 mg | ORAL | @ 03:00:00 | Stop: 2023-05-05

## 2023-04-30 MED ADMIN — ondansetron (ZOFRAN) injection 8 mg: 8 mg | INTRAVENOUS | @ 08:00:00

## 2023-04-30 MED ADMIN — promethazine (PHENERGAN) tablet 12.5 mg: 12.5 mg | ORAL | @ 15:00:00

## 2023-04-30 MED ADMIN — simethicone (MYLICON) chewable tablet 80 mg: 80 mg | ORAL | @ 17:00:00

## 2023-04-30 MED ADMIN — apixaban (ELIQUIS) tablet 5 mg: 5 mg | ORAL | @ 13:00:00

## 2023-04-30 MED ADMIN — oxyCODONE (ROXICODONE) 5 mg/5 mL solution 20 mg: 20 mg | ORAL | @ 09:00:00 | Stop: 2023-05-05

## 2023-04-30 MED ADMIN — acetaminophen (TYLENOL) oral liquid: 1000 mg | ENTERAL | @ 11:00:00

## 2023-04-30 MED ADMIN — ammonium lactate (LAC-HYDRIN) 12 % lotion 1 Application: 1 | TOPICAL | @ 13:00:00

## 2023-04-30 MED ADMIN — sodium chloride (NS) 0.9 % flush 10 mL: 10 mL | INTRAVENOUS | @ 11:00:00

## 2023-04-30 MED ADMIN — baclofen (LIORESAL) tablet 5 mg: 5 mg | ORAL | @ 13:00:00

## 2023-04-30 MED ADMIN — HYDROmorphone (PF) (DILAUDID) injection 1 mg: 1 mg | INTRAVENOUS | @ 17:00:00 | Stop: 2023-05-05

## 2023-04-30 MED ADMIN — sodium chloride (NS) 0.9 % flush 10 mL: 10 mL | INTRAVENOUS | @ 19:00:00

## 2023-04-30 MED ADMIN — oxyCODONE (ROXICODONE) 5 mg/5 mL solution 20 mg: 20 mg | ORAL | @ 23:00:00 | Stop: 2023-05-05

## 2023-04-30 MED ADMIN — baclofen (LIORESAL) tablet 5 mg: 5 mg | ORAL | @ 02:00:00

## 2023-04-30 MED ADMIN — escitalopram oxalate (LEXAPRO) tablet 5 mg: 5 mg | ENTERAL | @ 13:00:00

## 2023-04-30 MED ADMIN — famotidine (PEPCID) oral suspension: 20 mg | ENTERAL | @ 02:00:00

## 2023-04-30 MED ADMIN — vancomycin (FIRVANQ) (50 mg/mL) oral solution: 125 mg | ORAL | @ 15:00:00 | Stop: 2023-05-13

## 2023-04-30 MED ADMIN — apixaban (ELIQUIS) tablet 5 mg: 5 mg | ORAL | @ 02:00:00

## 2023-04-30 MED ADMIN — pantoprazole (Protonix) injection 40 mg: 40 mg | INTRAVENOUS | @ 13:00:00

## 2023-04-30 MED ADMIN — simethicone (MYLICON) chewable tablet 80 mg: 80 mg | ORAL | @ 11:00:00

## 2023-04-30 MED ADMIN — vancomycin (FIRVANQ) (50 mg/mL) oral solution: 125 mg | ORAL | @ 02:00:00 | Stop: 2023-05-13

## 2023-04-30 MED ADMIN — pantoprazole (Protonix) injection 40 mg: 40 mg | INTRAVENOUS | @ 02:00:00

## 2023-04-30 MED ADMIN — oxyCODONE (ROXICODONE) 5 mg/5 mL solution 20 mg: 20 mg | ORAL | @ 13:00:00 | Stop: 2023-05-05

## 2023-04-30 MED ADMIN — promethazine (PHENERGAN) tablet 12.5 mg: 12.5 mg | ORAL | @ 09:00:00

## 2023-04-30 MED ADMIN — simethicone (MYLICON) chewable tablet 80 mg: 80 mg | ORAL | @ 23:00:00

## 2023-04-30 MED ADMIN — acetaminophen (TYLENOL) oral liquid: 1000 mg | ENTERAL | @ 02:00:00

## 2023-04-30 MED ADMIN — acetaminophen (TYLENOL) oral liquid: 1000 mg | ENTERAL | @ 19:00:00

## 2023-04-30 MED ADMIN — vancomycin (FIRVANQ) (50 mg/mL) oral solution: 125 mg | ORAL | @ 07:00:00 | Stop: 2023-05-13

## 2023-04-30 MED ADMIN — promethazine (PHENERGAN) tablet 12.5 mg: 12.5 mg | ORAL | @ 22:00:00

## 2023-04-30 MED ADMIN — baclofen (LIORESAL) tablet 5 mg: 5 mg | ORAL | @ 19:00:00

## 2023-04-30 MED ADMIN — lidocaine (ASPERCREME) 4 % 1 patch: 1 | TRANSDERMAL | @ 13:00:00

## 2023-04-30 MED ADMIN — promethazine (PHENERGAN) tablet 12.5 mg: 12.5 mg | ORAL | @ 02:00:00

## 2023-04-30 MED ADMIN — oxyCODONE (ROXICODONE) 5 mg/5 mL solution 20 mg: 20 mg | ORAL | @ 19:00:00 | Stop: 2023-05-05

## 2023-04-30 NOTE — Unmapped (Signed)
 Prn Oxycodone and Dilaudid given for abdominal pain. Medications crushed and administered through NG tube. Both PIVs flushed, patent. Mother remains at bedside and is involved in care. Call light and bedside table within reach.    Problem: Rehabilitation (IRF) Plan of Care  Goal: Plan of Care Review  Outcome: Progressing  Goal: Patient-Specific Goal (Individualized)  Outcome: Progressing  Goal: Absence of New-Onset Illness or Injury  Outcome: Progressing  Intervention: Prevent Fall and Fall Injury  Recent Flowsheet Documentation  Taken 04/30/2023 1400 by Charlyne Quale, RN  Safety Interventions: fall reduction program maintained  Taken 04/30/2023 1200 by Charlyne Quale, RN  Safety Interventions: fall reduction program maintained  Taken 04/30/2023 0800 by Charlyne Quale, RN  Safety Interventions: fall reduction program maintained  Taken 04/30/2023 0705 by Charlyne Quale, RN  Safety Interventions: fall reduction program maintained  Intervention: Prevent Skin Injury  Recent Flowsheet Documentation  Taken 04/30/2023 0800 by Charlyne Quale, RN  Skin Protection: incontinence pads utilized  Goal: Optimal Comfort and Wellbeing  Outcome: Progressing  Goal: Home and Community Transition Plan Established  Outcome: Progressing  Goal: Rounds/Family Conference  Outcome: Progressing     Problem: Latex Allergy  Goal: Absence of Allergy Symptoms  Outcome: Progressing     Problem: Self-Care Deficit  Goal: Improved Ability to Complete Activities of Daily Living  Outcome: Progressing     Problem: Fall Injury Risk  Goal: Absence of Fall and Fall-Related Injury  Outcome: Progressing  Intervention: Promote Scientist, clinical (histocompatibility and immunogenetics) Documentation  Taken 04/30/2023 1400 by Charlyne Quale, RN  Safety Interventions: fall reduction program maintained  Taken 04/30/2023 1200 by Charlyne Quale, RN  Safety Interventions: fall reduction program maintained  Taken 04/30/2023 0800 by Charlyne Quale, RN  Safety Interventions: fall reduction program maintained  Taken 04/30/2023 0705 by Charlyne Quale, RN  Safety Interventions: fall reduction program maintained     Problem: Malnutrition  Goal: Improved Nutritional Intake  Outcome: Progressing     Problem: Infection  Goal: Absence of Infection Signs and Symptoms  Outcome: Progressing

## 2023-04-30 NOTE — Unmapped (Signed)
 Physical Medicine and Rehabilitation  Daily Progress Note [facility]    ASSESSMENT:     [name] [name] is a 23 y.o. female 23 y.o. female with PMH of [name] s/p proctocolectomy and J-pouch, desmoid tumours, chronic abdominal pain and nausea, anxiety, PTSD related to past medical care, DVT RUE s/p Eliquis x3 months admitted for constellation of worsening pain, intolerance of oral intake. She is now admitted to [facility] for comprehensive interdisciplinary rehabilitation.      Rehab Impairment Group Code [facility]):   (Debility) 16 Debility (Non-Cardiac/Non-Pulmonary)   Etiology: worsening pain, intolerance of oral intake     PLAN:     This patient is admitted to the Physical Medicine and Rehabilitation - Inpatient - A service from 8am-5pm on weekdays for questions regarding this patient. After hours, weekends, and holidays please contact the 1st Call resident pager     REHAB:   - PT and OT to maximize functional status with mobility and ADLs as well as prevention of joint contracture.   - P&O for assistive devices PRN.  - To be discussed in weekly Interdisciplinary Team Conference.     Acute on chronic abdominal pain and nausea  Complex situation discussed at length in prior notes. In brief, her pain and nausea are (deeply) multifactorial but consensus among treatment team is that new / unknown organic issue is unlikely. Plan to focus on symptom control, adequate nutrition.  Patient is currently at goal tube feeding, tolerating well.  No visible emesis noted.  Still reports persistent nausea but on aggressive nausea management as below. Chronic pain service consulted, appreciated.  - NO CHANGES to pain regimen without multiparty discussion including patient, unless true emergency.  Seen by chronic pain 1/28, recommended to continue the pain regimen as below no changes made.  - Pain management:  -- oxycodone (liquid) 20 mg q4 prn-first-line for pain  -- changed hydromorphone 1 mg IV q6prn -second line for pain  (ONLY IF inadequate relief 1 hour after oral alternative)  -- buprenorphine 20 mcg/h patch   -- hyoscyamine prn  -- Baclofen 5mg  TID  - Nausea: discuss about transitioning one of the IV nausea medication to p.o.  -- ondansetron IV q8 PRN  -- promethazine PO Q6H 12.5mg   -- scopolamine patch  -- olanzapine 5 mg in evening  -- no diphenhydramine IV in absence of clear indication  - Dyspepsia:  -- famotidine bid  -- PPI IV q12  - GI motility:  -- naloxegol prn  -- Simethicone 3 times daily  -- okay to use loperamide sparingly  -- C diff positive    C diff infection  C diff assay positive on 2/19. GI recommending oral vanc for 14 days.  - Vancomycin 125mg  4 times daily for 14 days (2/19-)     Left arm PICC line site swelling, pain and ecchymosis - DVT  Left arm PICC line site noted mild swelling and mild ecchymosis from insertion.  No visible swelling noted below the elbow.  PVL confirmed acute DVT, started on anticoagulation and removed PICC line on 1/28, given acute DVT also patient is currently not on any IV antibiotic. Removed PICC line 1/28,  swelling around prior picc line site improving, mild redness/ rash  due to tape, still has bruises but resolving.   - Eliquis 5mg  BID; will need 3 months of therapy     Inadequate oral intake  Abnormal gut anatomy notwithstanding, it is the consensus among hospitalist and GI services that oral and/or enteral feeding  should be sufficient to meet caloric and hydration needs. Has not developed refeeding syndrome. TPN will not be offered in forseeable future.  - cyclic feeding with Nutren 2.0, at goal rate 50 cc/h for 16 hours with free water flushes every 3 hours of 75cc.   - Calorie count per nutrition  [ ]  GI to follow and manage wean from enteral nutrition at discharge     Elevated transaminases  Improving: Labs with AST 21, ALT 70, downtrending from prior. No evidence of cholestasis or synthetic dysfunction. Presume drug-related. No longer following closely as trend is well established.     Pouchitis (dx 03/2023), resolving  No plan for repeat endoscopy or colonoscopy during this admission, per GI, given recent exam. No significant decrement in Hb so far. s/p Cefdinir 300 mg bid for 4 weeks (end = 04/07/2023)  - Cefdinir 300 mg daily (end = 05/05/2023)  - CBC weekly     Desmoid tumors  - okay to hold nirogesat while having difficulty with oral meds, no urgency to resume per Dr [name]     Mood / anxiety  - continue escitalopram  - lorazepam 1 mg bid prn (home medication)     Daily Checklist  - Diet: Regular diet with cyclic tube feeds  - DVT PPX: eliquis  - GI PPX: IV protonix BID  - Access: piv    DISPO: Patient to be discussed at weekly interdisciplinary team conference.   - EDD: 2/21  - Follow-up: PCP, PM&R, GI, Oncology    SUBJECTIVE:     Interval Events:  NAEO.  Pt reporting some slow down in her stooling as well as a bit better, formed consistency than previously. We are continuing to work with the Onc team at [facility] for a transfer to a medicine service for further treatment, monitoring, and if needed work up due to pt's complicated PMH. She remains clinically stable this morning.      OBJECTIVE:     Vital signs (last 24 hours):  Temp:  [36.6 ??C (97.9 ??F)] 36.6 ??C (97.9 ??F)  Heart Rate:  [83] 83  Resp:  [18] 18  BP: (106-111)/(62-66) 106/62  MAP (mmHg):  [75-80] 75  SpO2:  [95 %] 95 %    Intake/Output (last 3 shifts):  I/O last 3 completed shifts:  In: 1045 [I.V.:20; NG/GT:1025]  Out: 0     Physical Exam:   GEN: NAD, sitting up in bed  EYES: sclera anicteric, conjunctiva clear   HEENT: MMM, NG tube in place  NECK: trachea midline  CV: warm extremities, well perfused, no cyanosis   RESP:  NWOB on RA  ABD: Not distended  SKIN: no visible masses, lesions, rashes, ecchymoses.   MSK: no notable contractures or ext swelling  NEURO: Alert, MAE, speech fluid and coherent, responds appropriately to questions   PSYCH: mood euthymic, affect appropriate, thought process logical Medications:  Scheduled    acetaminophen (TYLENOL) oral liquid Q8H SCH    ammonium lactate (LAC-HYDRIN) 12 % lotion 1 Application BID    apixaban (ELIQUIS) tablet 5 mg BID    baclofen (LIORESAL) tablet 5 mg TID    buprenorphine 20 mcg/hour transdermal patch 1 patch Weekly    [Provider Hold] cefdinir (OMNICEF) oral suspension Daily    escitalopram oxalate (LEXAPRO) tablet 5 mg Daily    famotidine (PEPCID) oral suspension Nightly    flu vaccine TS 2024-25(94mos up)(PF)(FLULAVAL, FLUARIX, FLUZONE) During hospitalization    fluticasone propionate (FLONASE) 50 mcg/actuation nasal spray 1 spray Weekly  lidocaine (ASPERCREME) 4 % 1 patch Daily    multivitamin with folic acid 400 mcg tablet 1 tablet Daily    OLANZapine zydis (ZYPREXA) disintegrating tablet 5 mg Q PM    pantoprazole (Protonix) injection 40 mg BID    promethazine (PHENERGAN) tablet 12.5 mg Q6H    [Provider Hold] psyllium (METAMUCIL) 3.4 gram packet 1 packet TID    scopolamine (TRANSDERM-SCOP) 1 mg over 3 days topical patch 1 mg Q72H    simethicone (MYLICON) chewable tablet 80 mg TID    sodium chloride (NS) 0.9 % flush 10 mL Q8H    vancomycin (FIRVANQ) (50 mg/mL) oral solution Q6H     PRN cetirizine, 20 mg, Daily PRN  hydrocortisone, , BID PRN  hydrocortisone, , BID PRN  HYDROmorphone, 1 mg, Q6H PRN  hyoscyamine, 125 mcg, Q4H PRN  loperamide, 2 mg, QID PRN  LORazepam, 1 mg, BID PRN  melatonin, 3 mg, Nightly PRN  naloxegol, 12.5 mg, Daily PRN  ondansetron, 8 mg, Q8H PRN  oxyCODONE, 20 mg, Q4H PRN  zinc oxide-cod liver oil, , Daily PRN          Labs/Studies: Reviewed     Radiology Results:   No results found.            Quality Indicators      Hearing, Speech, and Vision  Ability to Hear: Adequate  Ability to See in Adequate Light: Adequate  Expression of Ideas and Wants: Without difficulty  Understanding Verbal and Non-Verbal Content: Understands    Cognitive Pattern Assessment  Cognitive Pattern Assessment Used: BIMS  Brief Interview for Mental Status (BIMS)  Repetition of Three Words (First Attempt): 3  Temporal Orientation: Year: Correct  Temporal Orientation: Month: Accurate within 5 days  Temporal Orientation: Day: Correct  Recall: Sock: Yes, no cue required  Recall: Blue: Yes, no cue required  Recall: Bed: Yes, no cue required  BIMS Summary Score: 15       Nutritional Approaches  Nutritional Approach: Feeding tube    ADLs  Admission Current   Eating Assistance Needed: Set-up / clean-up    CARE Score - 5 Assistance Needed: Independent    CARE Score - 6   Oral Hygiene  Assistance Needed: Physical assistance Total assistance  CARE Score - 1  Assistance Needed: Independent    CARE Score - 6   Bladder Continence       Bowel Continence       Toileting Hygiene Assistance Needed: Incidental touching    CARE Score - 4 Assistance Needed: Independent    CARE Score - 6    Toilet Transfer Assistance Needed: Physical assistance 25% or less  CARE Score - 3 Assistance Needed: Supervision    CARE Score - 4   Shower/Bathe Self Assistance Needed: Set-up / clean-up    CARE Score - 88  Assistance Needed: Independent    CARE Score - 6   Upper Body Dressing Assistance Needed: Physical assistance 26%-50%  CARE Score - 3  Assistance Needed: Independent    CARE Score - 6   Lower Body Dressing Assistance Needed: Physical assistance 76% or more  CARE Score - 2  Assistance Needed: Independent    CARE Score - 6   On/Off Footwear Assistance Needed: Physical assistance Total assistance  CARE Score - 1  Assistance Needed: Independent    CARE Score - 6         Transfers Admission Current   Bed to Chair Assistance Needed: Incidental touching    CARE  Score - 4  Assistance Needed: Adaptive equipment, Independent    CARE Score - 6     Lying to Sitting Assistance Needed: Physical assistance 26%-50%  CARE Score - 3  Assistance Needed: Adaptive equipment, Independent    CARE Score - 6   Roll Left/Right Assistance Needed: Physical assistance 25% or less  CARE Score - 3  Assistance Needed: Independent    CARE Score - 6   Sit to Lying Assistance Needed: Physical assistance 26%-50%  CARE Score - 3  Assistance Needed: Independent, Adaptive equipment    CARE Score - 6   Sit to Stand Assistance Needed: Incidental touching    CARE Score - 4  Assistance Needed: Adaptive equipment, Independent    CARE Score - 6         Mobility Admission Current   Walk 10 Feet Assistance Needed: Adaptive equipment, Physical assistance 25% or less  CARE Score - 3 Assistance Needed: Adaptive equipment, Independent    CARE Score - 6     Walk 50 Feet 2 Turns Assistance Needed: Physical assistance 51%-75%  CARE Score - 2 Assistance Needed: Set-up / clean-up, Adaptive equipment    CARE Score - 5   Walk 150 Feet Assistance Needed: Set-up / clean-up, Adaptive equipment    CARE Score - 88  Assistance Needed: Set-up / clean-up, Adaptive equipment    CARE Score - 5   Walk 10 Feet Uneven Assistance Needed: Set-up / clean-up, Adaptive equipment    CARE Score - 88  Assistance Needed: Set-up / clean-up, Adaptive equipment    CARE Score - 5   1 Step (Curb) Assistance Needed: Physical assistance, Adaptive equipment 25% or less  CARE Score - 88  Assistance Needed: Set-up / clean-up, Adaptive equipment    CARE Score - 5   4 Steps Assistance Needed: Incidental touching, Adaptive equipment    CARE Score - 88  Assistance Needed: Incidental touching, Adaptive equipment    CARE Score - 4   12 Steps      CARE Score - 88       CARE Score - 88   Picking Up Object Assistance Needed: Adaptive equipment, Set-up / clean-up    CARE Score - 10  Assistance Needed: Adaptive equipment, Set-up / clean-up    CARE Score - 5   Wheelchair/Scooter Use        Wheel 50 Feet 2 Turns Assistance Needed: Independent      CARE Score - Wheel 50 Feet with Two Turns: 6    Type of Wheelchair/Scooter: Manual Assistance Needed: Independent      CARE Score - Wheel 50 Feet with Two Turns: 6    Type of Wheelchair/Scooter: Manual   Wheel 150 Feet Assistance Needed: Physical assistance 26%-50%    CARE Score - Wheel 150 Feet: 3    Type of Wheelchair/Scooter: Manual  Assistance Needed: Independent      CARE Score - Wheel 150 Feet: 6    Type of Wheelchair/Scooter: Manual

## 2023-04-30 NOTE — Unmapped (Signed)
 Problem: Rehabilitation (IRF) Plan of Care  Goal: Plan of Care Review  Outcome: Progressing  Goal: Patient-Specific Goal (Individualized)  Outcome: Progressing  Goal: Absence of New-Onset Illness or Injury  Outcome: Progressing  Intervention: Prevent Fall and Fall Injury  Recent Flowsheet Documentation  Taken 04/30/2023 0600 by Barbaraann Cao, RN  Safety Interventions: fall reduction program maintained  Taken 04/30/2023 0400 by Barbaraann Cao, RN  Safety Interventions: fall reduction program maintained  Taken 04/30/2023 0000 by Barbaraann Cao, RN  Safety Interventions: fall reduction program maintained  Taken 04/29/2023 2200 by Barbaraann Cao, RN  Safety Interventions: fall reduction program maintained  Taken 04/29/2023 2000 by Barbaraann Cao, RN  Safety Interventions: fall reduction program maintained  Goal: Optimal Comfort and Wellbeing  Outcome: Progressing  Goal: Home and Community Transition Plan Established  Outcome: Progressing  Goal: Rounds/Family Conference  Outcome: Progressing

## 2023-05-01 LAB — BASIC METABOLIC PANEL
ANION GAP: 9 mmol/L (ref 5–14)
BLOOD UREA NITROGEN: 14 mg/dL (ref 9–23)
BUN / CREAT RATIO: 30
CALCIUM: 9.5 mg/dL (ref 8.7–10.4)
CHLORIDE: 104 mmol/L (ref 98–107)
CO2: 29.7 mmol/L (ref 20.0–31.0)
CREATININE: 0.46 mg/dL — ABNORMAL LOW (ref 0.55–1.02)
EGFR CKD-EPI (2021) FEMALE: >90 mL/min/{1.73_m2} (ref >=60)
GLUCOSE RANDOM: 97 mg/dL (ref 70–179)
POTASSIUM: 4.1 mmol/L (ref 3.5–5.1)
SODIUM: 143 mmol/L (ref 135–145)

## 2023-05-01 LAB — CBC
HEMATOCRIT: 33.4 % — ABNORMAL LOW (ref 34.0–44.0)
HEMOGLOBIN: 11.3 g/dL (ref 11.3–14.9)
MEAN CORPUSCULAR HEMOGLOBIN CONC: 33.7 g/dL (ref 32.0–36.0)
MEAN CORPUSCULAR HEMOGLOBIN: 29 pg (ref 25.9–32.4)
MEAN CORPUSCULAR VOLUME: 85.9 fL (ref 77.6–95.7)
MEAN PLATELET VOLUME: 9.5 fL (ref 6.8–10.7)
PLATELET COUNT: 220 10*9/L (ref 150–450)
RED BLOOD CELL COUNT: 3.89 10*12/L — ABNORMAL LOW (ref 3.95–5.13)
RED CELL DISTRIBUTION WIDTH: 17.6 % — ABNORMAL HIGH (ref 12.2–15.2)
WBC ADJUSTED: 9.3 10*9/L (ref 3.6–11.2)

## 2023-05-01 LAB — PHOSPHORUS: PHOSPHORUS: 4.8 mg/dL (ref 2.4–5.1)

## 2023-05-01 LAB — MAGNESIUM: MAGNESIUM: 1.9 mg/dL (ref 1.6–2.6)

## 2023-05-01 MED ADMIN — vancomycin (FIRVANQ) (50 mg/mL) oral solution: 125 mg | ORAL | @ 14:00:00 | Stop: 2023-05-13

## 2023-05-01 MED ADMIN — acetaminophen (TYLENOL) oral liquid: 1000 mg | ENTERAL | @ 11:00:00

## 2023-05-01 MED ADMIN — ammonium lactate (LAC-HYDRIN) 12 % lotion 1 Application: 1 | TOPICAL | @ 02:00:00

## 2023-05-01 MED ADMIN — promethazine (PHENERGAN) tablet 12.5 mg: 12.5 mg | ORAL | @ 15:00:00

## 2023-05-01 MED ADMIN — scopolamine (TRANSDERM-SCOP) 1 mg over 3 days topical patch 1 mg: 1 | TOPICAL | @ 14:00:00

## 2023-05-01 MED ADMIN — apixaban (ELIQUIS) tablet 5 mg: 5 mg | ORAL | @ 14:00:00

## 2023-05-01 MED ADMIN — pantoprazole (Protonix) injection 40 mg: 40 mg | INTRAVENOUS | @ 14:00:00

## 2023-05-01 MED ADMIN — vancomycin (FIRVANQ) (50 mg/mL) oral solution: 125 mg | ORAL | @ 02:00:00 | Stop: 2023-05-13

## 2023-05-01 MED ADMIN — HYDROmorphone (PF) (DILAUDID) injection 1 mg: 1 mg | INTRAVENOUS | @ 01:00:00 | Stop: 2023-05-05

## 2023-05-01 MED ADMIN — oxyCODONE (ROXICODONE) 5 mg/5 mL solution 20 mg: 20 mg | ORAL | @ 02:00:00 | Stop: 2023-05-05

## 2023-05-01 MED ADMIN — HYDROmorphone (PF) (DILAUDID) injection 1 mg: 1 mg | INTRAVENOUS | @ 06:00:00 | Stop: 2023-05-05

## 2023-05-01 MED ADMIN — escitalopram oxalate (LEXAPRO) tablet 5 mg: 5 mg | ENTERAL | @ 14:00:00

## 2023-05-01 MED ADMIN — acetaminophen (TYLENOL) oral liquid: 1000 mg | ENTERAL | @ 02:00:00

## 2023-05-01 MED ADMIN — pantoprazole (Protonix) injection 40 mg: 40 mg | INTRAVENOUS | @ 02:00:00

## 2023-05-01 MED ADMIN — vancomycin (FIRVANQ) (50 mg/mL) oral solution: 125 mg | ORAL | @ 19:00:00 | Stop: 2023-05-13

## 2023-05-01 MED ADMIN — acetaminophen (TYLENOL) oral liquid: 1000 mg | ENTERAL | @ 19:00:00

## 2023-05-01 MED ADMIN — apixaban (ELIQUIS) tablet 5 mg: 5 mg | ORAL | @ 02:00:00

## 2023-05-01 MED ADMIN — baclofen (LIORESAL) tablet 5 mg: 5 mg | ORAL | @ 02:00:00

## 2023-05-01 MED ADMIN — HYDROmorphone (PF) (DILAUDID) injection 1 mg: 1 mg | INTRAVENOUS | @ 13:00:00 | Stop: 2023-05-05

## 2023-05-01 MED ADMIN — HYDROmorphone (PF) (DILAUDID) injection 1 mg: 1 mg | INTRAVENOUS | @ 19:00:00 | Stop: 2023-05-05

## 2023-05-01 MED ADMIN — oxyCODONE (ROXICODONE) 5 mg/5 mL solution 20 mg: 20 mg | ORAL | @ 15:00:00 | Stop: 2023-05-05

## 2023-05-01 MED ADMIN — promethazine (PHENERGAN) tablet 12.5 mg: 12.5 mg | ORAL | @ 08:00:00

## 2023-05-01 MED ADMIN — ammonium lactate (LAC-HYDRIN) 12 % lotion 1 Application: 1 | TOPICAL | @ 14:00:00

## 2023-05-01 MED ADMIN — ondansetron (ZOFRAN) injection 8 mg: 8 mg | INTRAVENOUS | @ 06:00:00

## 2023-05-01 MED ADMIN — oxyCODONE (ROXICODONE) 5 mg/5 mL solution 20 mg: 20 mg | ORAL | @ 21:00:00 | Stop: 2023-05-05

## 2023-05-01 MED ADMIN — oxyCODONE (ROXICODONE) 5 mg/5 mL solution 20 mg: 20 mg | ORAL | @ 11:00:00 | Stop: 2023-05-05

## 2023-05-01 MED ADMIN — sodium chloride (NS) 0.9 % flush 10 mL: 10 mL | INTRAVENOUS | @ 11:00:00

## 2023-05-01 MED ADMIN — simethicone (MYLICON) chewable tablet 80 mg: 80 mg | ORAL | @ 23:00:00

## 2023-05-01 MED ADMIN — famotidine (PEPCID) oral suspension: 20 mg | ENTERAL | @ 02:00:00

## 2023-05-01 MED ADMIN — oxyCODONE (ROXICODONE) 5 mg/5 mL solution 20 mg: 20 mg | ORAL | @ 08:00:00 | Stop: 2023-05-05

## 2023-05-01 MED ADMIN — vancomycin (FIRVANQ) (50 mg/mL) oral solution: 125 mg | ORAL | @ 06:00:00 | Stop: 2023-05-13

## 2023-05-01 MED ADMIN — lidocaine (ASPERCREME) 4 % 1 patch: 1 | TRANSDERMAL | @ 14:00:00

## 2023-05-01 MED ADMIN — baclofen (LIORESAL) tablet 5 mg: 5 mg | ORAL | @ 19:00:00

## 2023-05-01 MED ADMIN — sodium chloride (NS) 0.9 % flush 10 mL: 10 mL | INTRAVENOUS | @ 06:00:00

## 2023-05-01 MED ADMIN — promethazine (PHENERGAN) tablet 12.5 mg: 12.5 mg | ORAL | @ 02:00:00

## 2023-05-01 MED ADMIN — baclofen (LIORESAL) tablet 5 mg: 5 mg | ORAL | @ 14:00:00

## 2023-05-01 MED ADMIN — sodium chloride (NS) 0.9 % flush 10 mL: 10 mL | INTRAVENOUS | @ 19:00:00

## 2023-05-01 MED ADMIN — ondansetron (ZOFRAN) injection 8 mg: 8 mg | INTRAVENOUS | @ 19:00:00

## 2023-05-01 MED ADMIN — promethazine (PHENERGAN) tablet 12.5 mg: 12.5 mg | ORAL | @ 21:00:00

## 2023-05-01 MED ADMIN — simethicone (MYLICON) chewable tablet 80 mg: 80 mg | ORAL | @ 17:00:00

## 2023-05-01 MED ADMIN — simethicone (MYLICON) chewable tablet 80 mg: 80 mg | ORAL | @ 11:00:00

## 2023-05-01 NOTE — Unmapped (Signed)
 Physical Medicine and Rehabilitation  Daily Progress Note [facility]    ASSESSMENT:     [name] [name] is a 23 y.o. female 23 y.o. female with PMH of [name] s/p proctocolectomy and J-pouch, desmoid tumours, chronic abdominal pain and nausea, anxiety, PTSD related to past medical care, DVT RUE s/p Eliquis x3 months admitted for constellation of worsening pain, intolerance of oral intake. She is now admitted to [facility] for comprehensive interdisciplinary rehabilitation.      Rehab Impairment Group Code [facility]):   (Debility) 16 Debility (Non-Cardiac/Non-Pulmonary)   Etiology: worsening pain, intolerance of oral intake     PLAN:     This patient is admitted to the Physical Medicine and Rehabilitation - Inpatient - A service from 8am-5pm on weekdays for questions regarding this patient. After hours, weekends, and holidays please contact the 1st Call resident pager     REHAB:   - PT and OT to maximize functional status with mobility and ADLs as well as prevention of joint contracture.   - P&O for assistive devices PRN.  - To be discussed in weekly Interdisciplinary Team Conference.     Acute on chronic abdominal pain and nausea  Complex situation discussed at length in prior notes. In brief, her pain and nausea are (deeply) multifactorial but consensus among treatment team is that new / unknown organic issue is unlikely. Plan to focus on symptom control, adequate nutrition.  Patient is currently at goal tube feeding, tolerating well.  No visible emesis noted.  Still reports persistent nausea but on aggressive nausea management as below. Chronic pain service consulted, appreciated.  - NO CHANGES to pain regimen without multiparty discussion including patient, unless true emergency.  Seen by chronic pain 1/28, recommended to continue the pain regimen as below no changes made.  - Pain management:  -- oxycodone (liquid) 20 mg q4 prn-first-line for pain  -- changed hydromorphone 1 mg IV q6prn -second line for pain  (ONLY IF inadequate relief 1 hour after oral alternative)  -- buprenorphine 20 mcg/h patch   -- hyoscyamine prn  -- Baclofen 5mg  TID  - Nausea: discuss about transitioning one of the IV nausea medication to p.o.  -- ondansetron IV q8 PRN  -- promethazine PO Q6H 12.5mg   -- scopolamine patch  -- olanzapine 5 mg in evening  -- no diphenhydramine IV in absence of clear indication  - Dyspepsia:  -- famotidine bid  -- PPI IV q12  - GI motility:  -- naloxegol prn  -- Simethicone 3 times daily  -- okay to use loperamide sparingly  -- C diff positive    C diff infection  C diff assay positive on 2/19. GI recommending oral vanc for 14 days.  - Vancomycin 125mg  4 times daily for 14 days (2/19-)     Left arm PICC line site swelling, pain and ecchymosis - DVT  Left arm PICC line site noted mild swelling and mild ecchymosis from insertion.  No visible swelling noted below the elbow.  PVL confirmed acute DVT, started on anticoagulation and removed PICC line on 1/28, given acute DVT also patient is currently not on any IV antibiotic. Removed PICC line 1/28,  swelling around prior picc line site improving, mild redness/ rash  due to tape, still has bruises but resolving.   - Eliquis 5mg  BID; will need 3 months of therapy     Inadequate oral intake  Abnormal gut anatomy notwithstanding, it is the consensus among hospitalist and GI services that oral and/or enteral feeding  should be sufficient to meet caloric and hydration needs. Has not developed refeeding syndrome. TPN will not be offered in forseeable future.  - cyclic feeding with Nutren 2.0, at goal rate 50 cc/h for 16 hours with free water flushes every 3 hours of 75cc.   - Calorie count per nutrition  [ ]  GI to follow and manage wean from enteral nutrition at discharge     Elevated transaminases  Improving: Labs with AST 21, ALT 70, downtrending from prior. No evidence of cholestasis or synthetic dysfunction. Presume drug-related. No longer following closely as trend is well established.     Pouchitis (dx 03/2023), resolving  No plan for repeat endoscopy or colonoscopy during this admission, per GI, given recent exam. No significant decrement in Hb so far. s/p Cefdinir 300 mg bid for 4 weeks (end = 04/07/2023)  - Cefdinir 300 mg daily (end = 05/05/2023)  - CBC weekly     Desmoid tumors  - okay to hold nirogesat while having difficulty with oral meds, no urgency to resume per Dr [name]     Mood / anxiety  - continue escitalopram  - lorazepam 1 mg bid prn (home medication)     Daily Checklist  - Diet: Regular diet with cyclic tube feeds  - DVT PPX: eliquis  - GI PPX: IV protonix BID  - Access: piv    DISPO: Patient to be discussed at weekly interdisciplinary team conference.   - EDD: 2/21  - Follow-up: PCP, PM&R, GI, Oncology    SUBJECTIVE:     Interval Events:  NAEO.  She continues to report some better stooling. Stool is still quite loose and she has concerns about her abdominal pain. She also is reporting continued lymphadenopathy around her collar bone and a new cyst on the posterior neck that is hard with white head. We discussed that main campus may have room for her to move sometime today.     OBJECTIVE:     Vital signs (last 24 hours):  Temp:  [36.5 ??C (97.7 ??F)-36.6 ??C (97.9 ??F)] 36.5 ??C (97.7 ??F)  Heart Rate:  [77-87] 87  Resp:  [16-18] 16  BP: (100-122)/(52-69) 100/52  MAP (mmHg):  [67-83] 67  SpO2:  [97 %-99 %] 99 %    Intake/Output (last 3 shifts):  I/O last 3 completed shifts:  In: 200 [I.V.:20; NG/GT:180]  Out: -     Physical Exam:   GEN: NAD, sitting up in bed  EYES: sclera anicteric, conjunctiva clear   HEENT: MMM, NG tube in place  NECK: trachea midline, small white cyst over left posterior neck  CV: warm extremities, well perfused, no cyanosis   RESP:  NWOB on RA  ABD: Not distended  SKIN: no visible masses, lesions, rashes, ecchymoses.   MSK: no notable contractures or ext swelling  NEURO: Alert, MAE, speech fluid and coherent, responds appropriately to questions PSYCH: mood euthymic, affect appropriate, thought process logical         Medications:  Scheduled    acetaminophen (TYLENOL) oral liquid Q8H SCH    ammonium lactate (LAC-HYDRIN) 12 % lotion 1 Application BID    apixaban (ELIQUIS) tablet 5 mg BID    baclofen (LIORESAL) tablet 5 mg TID    buprenorphine 20 mcg/hour transdermal patch 1 patch Weekly    [Provider Hold] cefdinir (OMNICEF) oral suspension Daily    escitalopram oxalate (LEXAPRO) tablet 5 mg Daily    famotidine (PEPCID) oral suspension Nightly    flu vaccine TS 2024-25(87mos up)(PF)(FLULAVAL, FLUARIX,  FLUZONE) During hospitalization    fluticasone propionate (FLONASE) 50 mcg/actuation nasal spray 1 spray Weekly    lidocaine (ASPERCREME) 4 % 1 patch Daily    multivitamin with folic acid 400 mcg tablet 1 tablet Daily    OLANZapine zydis (ZYPREXA) disintegrating tablet 5 mg Q PM    pantoprazole (Protonix) injection 40 mg BID    promethazine (PHENERGAN) tablet 12.5 mg Q6H    [Provider Hold] psyllium (METAMUCIL) 3.4 gram packet 1 packet TID    scopolamine (TRANSDERM-SCOP) 1 mg over 3 days topical patch 1 mg Q72H    simethicone (MYLICON) chewable tablet 80 mg TID    sodium chloride (NS) 0.9 % flush 10 mL Q8H    vancomycin (FIRVANQ) (50 mg/mL) oral solution Q6H     PRN cetirizine, 20 mg, Daily PRN  hydrocortisone, , BID PRN  hydrocortisone, , BID PRN  HYDROmorphone, 1 mg, Q6H PRN  hyoscyamine, 125 mcg, Q4H PRN  loperamide, 2 mg, QID PRN  LORazepam, 1 mg, BID PRN  melatonin, 3 mg, Nightly PRN  naloxegol, 12.5 mg, Daily PRN  ondansetron, 8 mg, Q8H PRN  oxyCODONE, 20 mg, Q4H PRN  zinc oxide-cod liver oil, , Daily PRN          Labs/Studies: Reviewed     Radiology Results:   No results found.            Quality Indicators      Hearing, Speech, and Vision  Ability to Hear: Adequate  Ability to See in Adequate Light: Adequate  Expression of Ideas and Wants: Without difficulty  Understanding Verbal and Non-Verbal Content: Understands    Cognitive Pattern Assessment  Cognitive Pattern Assessment Used: BIMS  Brief Interview for Mental Status (BIMS)  Repetition of Three Words (First Attempt): 3  Temporal Orientation: Year: Correct  Temporal Orientation: Month: Accurate within 5 days  Temporal Orientation: Day: Correct  Recall: Sock: Yes, no cue required  Recall: [name]: Yes, no cue required  Recall: Bed: Yes, no cue required  BIMS Summary Score: 15       Nutritional Approaches  Nutritional Approach: Feeding tube    ADLs  Admission Current   Eating Assistance Needed: Set-up / clean-up    CARE Score - 5 Assistance Needed: Independent    CARE Score - 6   Oral Hygiene  Assistance Needed: Physical assistance Total assistance  CARE Score - 1  Assistance Needed: Independent    CARE Score - 6   Bladder Continence       Bowel Continence       Toileting Hygiene Assistance Needed: Incidental touching    CARE Score - 4 Assistance Needed: Independent    CARE Score - 6    Toilet Transfer Assistance Needed: Physical assistance 25% or less  CARE Score - 3 Assistance Needed: Supervision    CARE Score - 4   Shower/Bathe Self Assistance Needed: Set-up / clean-up    CARE Score - 88  Assistance Needed: Set-up / clean-up, Supervision    CARE Score - 4   Upper Body Dressing Assistance Needed: Physical assistance 26%-50%  CARE Score - 3  Assistance Needed: Independent    CARE Score - 6   Lower Body Dressing Assistance Needed: Physical assistance 76% or more  CARE Score - 2  Assistance Needed: Supervision    CARE Score - 4   On/Off Footwear Assistance Needed: Physical assistance Total assistance  CARE Score - 1  Assistance Needed: Set-up / clean-up    CARE Score - 5  Transfers Admission Current   Bed to Chair Assistance Needed: Incidental touching    CARE Score - 4  Assistance Needed: Adaptive equipment, Independent    CARE Score - 6     Lying to Sitting Assistance Needed: Physical assistance 26%-50%  CARE Score - 3  Assistance Needed: Adaptive equipment, Independent    CARE Score - 6   Roll Left/Right Assistance Needed: Physical assistance 25% or less  CARE Score - 3  Assistance Needed: Independent    CARE Score - 6   Sit to Lying Assistance Needed: Physical assistance 26%-50%  CARE Score - 3  Assistance Needed: Independent, Adaptive equipment    CARE Score - 6   Sit to Stand Assistance Needed: Incidental touching    CARE Score - 4  Assistance Needed: Adaptive equipment, Independent    CARE Score - 6         Mobility Admission Current   Walk 10 Feet Assistance Needed: Adaptive equipment, Physical assistance 25% or less  CARE Score - 3 Assistance Needed: Adaptive equipment, Independent    CARE Score - 6     Walk 50 Feet 2 Turns Assistance Needed: Physical assistance 51%-75%  CARE Score - 2 Assistance Needed: Set-up / clean-up, Adaptive equipment    CARE Score - 5   Walk 150 Feet Assistance Needed: Set-up / clean-up, Adaptive equipment    CARE Score - 88  Assistance Needed: Set-up / clean-up, Adaptive equipment    CARE Score - 5   Walk 10 Feet Uneven Assistance Needed: Set-up / clean-up, Adaptive equipment    CARE Score - 88  Assistance Needed: Set-up / clean-up, Adaptive equipment    CARE Score - 5   1 Step (Curb) Assistance Needed: Physical assistance, Adaptive equipment 25% or less  CARE Score - 88  Assistance Needed: Set-up / clean-up, Adaptive equipment    CARE Score - 5   4 Steps Assistance Needed: Incidental touching, Adaptive equipment    CARE Score - 88  Assistance Needed: Incidental touching, Adaptive equipment    CARE Score - 4   12 Steps      CARE Score - 88       CARE Score - 88   Picking Up Object Assistance Needed: Adaptive equipment, Set-up / clean-up    CARE Score - 10  Assistance Needed: Adaptive equipment, Set-up / clean-up    CARE Score - 5   Wheelchair/Scooter Use        Wheel 50 Feet 2 Turns Assistance Needed: Independent      CARE Score - Wheel 50 Feet with Two Turns: 6    Type of Wheelchair/Scooter: Manual Assistance Needed: Independent      CARE Score - Wheel 50 Feet with Two Turns: 6    Type of Wheelchair/Scooter: Manual   Wheel 150 Feet Assistance Needed: Physical assistance 26%-50%    CARE Score - Wheel 150 Feet: 3    Type of Wheelchair/Scooter: Manual  Assistance Needed: Independent      CARE Score - Wheel 150 Feet: 6    Type of Wheelchair/Scooter: Manual

## 2023-05-01 NOTE — Unmapped (Signed)
 Pt is A&O x4. Abdominal pain managed with PRN dilaudid and oxycodone. Nausea managed with scheduled phenergan and PRN Zofran. Corepack remains in place in [place]. Continent of bowel and bladder. Mother remains at bedside and assists with patient care.   Problem: Rehabilitation (IR[facility]) Plan of Care  Goal: Plan of Care Review  Outcome: Progressing  Goal: Patient-Specific Goal (Individualized)  Outcome: Progressing  Goal: Absence of New-Onset Illness or Injury  Outcome: Progressing  Intervention: Prevent Fall and Fall Injury  Recent Flowsheet Documentation  Taken 05/01/2023 1200 by Dawna Part, RN  Safety Interventions:   fall reduction program maintained   low bed   nonskid shoes/slippers when out of bed  Taken 05/01/2023 1000 by Dawna Part, RN  Safety Interventions:   fall reduction program maintained   low bed   nonskid shoes/slippers when out of bed  Taken 05/01/2023 0800 by Dawna Part, RN  Safety Interventions:   fall reduction program maintained   low bed   nonskid shoes/slippers when out of bed  Intervention: Prevent Skin Injury  Recent Flowsheet Documentation  Taken 05/01/2023 1200 by Dawna Part, RN  Device Skin Pressure Protection: adhesive use limited  Skin Protection:   adhesive use limited   incontinence pads utilized  Taken 05/01/2023 1000 by Dawna Part, RN  Device Skin Pressure Protection:   absorbent pad utilized/changed   adhesive use limited  Skin Protection:   adhesive use limited   incontinence pads utilized  Taken 05/01/2023 0800 by Dawna Part, RN  Device Skin Pressure Protection: adhesive use limited  Skin Protection: adhesive use limited  Intervention: Prevent Infection  Recent Flowsheet Documentation  Taken 05/01/2023 1200 by Dawna Part, RN  Infection Prevention:   hand hygiene promoted   single patient room provided  Taken 05/01/2023 1000 by Dawna Part, RN  Infection Prevention:   hand hygiene promoted   single patient room provided  Taken 05/01/2023 0800 by Dawna Part, RN  Infection Prevention:   hand hygiene promoted   single patient room provided  Intervention: Prevent VTE (Venous Thromboembolism)  Recent Flowsheet Documentation  Taken 05/01/2023 0800 by Dawna Part, RN  VTE Prevention/Management: anticoagulant therapy  Goal: Optimal Comfort and Wellbeing  Outcome: Progressing  Goal: Home and Community Transition Plan Established  Outcome: Progressing  Goal: Rounds/Family Conference  Outcome: Progressing     Problem: Latex Allergy  Goal: Absence of Allergy Symptoms  Outcome: Progressing  Intervention: Maintain Latex-Aware Environment  Recent Flowsheet Documentation  Taken 05/01/2023 1200 by Dawna Part, RN  Latex Precautions: latex precautions maintained  Taken 05/01/2023 1000 by Dawna Part, RN  Latex Precautions: latex precautions maintained  Taken 05/01/2023 0800 by Dawna Part, RN  Latex Precautions: latex precautions maintained     Problem: Self-Care Deficit  Goal: Improved Ability to Complete Activities of Daily Living  Outcome: Progressing     Problem: Fall Injury Risk  Goal: Absence of Fall and Fall-Related Injury  Outcome: Progressing  Intervention: Promote Injury-Free Environment  Recent Flowsheet Documentation  Taken 05/01/2023 1200 by Dawna Part, RN  Safety Interventions:   fall reduction program maintained   low bed   nonskid shoes/slippers when out of bed  Taken 05/01/2023 1000 by Dawna Part, RN  Safety Interventions:   fall reduction program maintained   low bed   nonskid shoes/slippers when out of bed  Taken 05/01/2023 0800 by Dawna Part, RN  Safety Interventions:   fall reduction program maintained   low bed   nonskid shoes/slippers when out of bed  Problem: Malnutrition  Goal: Improved Nutritional Intake  Outcome: Progressing     Problem: Infection  Goal: Absence of Infection Signs and Symptoms  Outcome: Progressing  Intervention: Prevent or Manage Infection  Recent Flowsheet Documentation  Taken 05/01/2023 1200 by Dawna Part, RN  Infection Management: aseptic technique maintained  Isolation Precautions: enteric precautions maintained  Taken 05/01/2023 1000 by Dawna Part, RN  Infection Management: aseptic technique maintained  Isolation Precautions: enteric precautions maintained  Taken 05/01/2023 0800 by Dawna Part, RN  Infection Management: aseptic technique maintained  Isolation Precautions: enteric precautions maintained

## 2023-05-01 NOTE — Unmapped (Signed)
 A/O. Dilaudid and oxycodone given for pain. TF running per order. Corpak intact. Mother at bedside.   Problem: Fall Injury Risk  Goal: Absence of Fall and Fall-Related Injury  Outcome: Progressing  Intervention: Promote Scientist, clinical (histocompatibility and immunogenetics) Documentation  Taken 04/30/2023 2000 by Myles Lipps, RN  Safety Interventions:   low bed   fall reduction program maintained     Problem: Rehabilitation (IRF) Plan of Care  Goal: Plan of Care Review  Outcome: Progressing  Goal: Patient-Specific Goal (Individualized)  Outcome: Progressing  Goal: Absence of New-Onset Illness or Injury  Outcome: Progressing  Intervention: Prevent Fall and Fall Injury  Recent Flowsheet Documentation  Taken 04/30/2023 2000 by Myles Lipps, RN  Safety Interventions:   low bed   fall reduction program maintained  Goal: Optimal Comfort and Wellbeing  Outcome: Progressing  Goal: Home and Community Transition Plan Established  Outcome: Progressing  Goal: Rounds/Family Conference  Outcome: Progressing

## 2023-05-02 ENCOUNTER — Encounter
Admit: 2023-05-02 | Discharge: 2023-05-08 | Disposition: A | Discharge status: Home Health | Payer: BLUE CROSS/BLUE SHIELD | Source: Other Acute Inpatient Hospital | Attending: Hematology & Oncology | Admitting: Student in an Organized Health Care Education/Training Program

## 2023-05-02 ENCOUNTER — Encounter: Admit: 2023-05-02 | Discharge: 2023-05-08 | Payer: BLUE CROSS/BLUE SHIELD | Attending: Hematology & Oncology

## 2023-05-02 ENCOUNTER — Inpatient Hospital Stay
Admit: 2023-05-02 | Discharge: 2023-05-08 | Disposition: A | Discharge status: Home Health | Payer: BLUE CROSS/BLUE SHIELD | Source: Other Acute Inpatient Hospital | Admitting: Student in an Organized Health Care Education/Training Program

## 2023-05-02 MED ADMIN — apixaban (ELIQUIS) tablet 5 mg: 5 mg | ORAL | @ 03:00:00

## 2023-05-02 MED ADMIN — sucralfate (CARAFATE) oral suspension: 1 g | ORAL | @ 17:00:00

## 2023-05-02 MED ADMIN — famotidine (PEPCID) oral suspension: 20 mg | ENTERAL | @ 03:00:00

## 2023-05-02 MED ADMIN — promethazine (PHENERGAN) tablet 12.5 mg: 12.5 mg | ORAL | @ 03:00:00

## 2023-05-02 MED ADMIN — vancomycin (FIRVANQ) (50 mg/mL) oral solution: 125 mg | ORAL | @ 19:00:00 | Stop: 2023-05-13

## 2023-05-02 MED ADMIN — sodium chloride (NS) 0.9 % flush 10 mL: 10 mL | INTRAVENOUS | @ 17:00:00

## 2023-05-02 MED ADMIN — LORazepam (ATIVAN) tablet 1 mg: 1 mg | ORAL | @ 18:00:00

## 2023-05-02 MED ADMIN — ondansetron (ZOFRAN) injection 8 mg: 8 mg | INTRAVENOUS | @ 19:00:00

## 2023-05-02 MED ADMIN — pantoprazole (Protonix) injection 40 mg: 40 mg | INTRAVENOUS | @ 03:00:00

## 2023-05-02 MED ADMIN — acetaminophen (TYLENOL) oral liquid: 1000 mg | ENTERAL | @ 19:00:00

## 2023-05-02 MED ADMIN — vancomycin (FIRVANQ) (50 mg/mL) oral solution: 125 mg | ORAL | @ 15:00:00 | Stop: 2023-05-13

## 2023-05-02 MED ADMIN — buprenorphine 20 mcg/hour transdermal patch 1 patch: 1 | TRANSDERMAL | @ 15:00:00

## 2023-05-02 MED ADMIN — vancomycin (FIRVANQ) (50 mg/mL) oral solution: 125 mg | ORAL | @ 08:00:00 | Stop: 2023-05-13

## 2023-05-02 MED ADMIN — HYDROmorphone (PF) (DILAUDID) injection 1 mg: 1 mg | INTRAVENOUS | @ 02:00:00 | Stop: 2023-05-05

## 2023-05-02 MED ADMIN — pantoprazole (Protonix) injection 40 mg: 40 mg | INTRAVENOUS | @ 15:00:00

## 2023-05-02 MED ADMIN — vancomycin (FIRVANQ) (50 mg/mL) oral solution: 125 mg | ORAL | @ 01:00:00 | Stop: 2023-05-13

## 2023-05-02 MED ADMIN — lidocaine (ASPERCREME) 4 % 1 patch: 1 | TRANSDERMAL | @ 17:00:00

## 2023-05-02 MED ADMIN — oxyCODONE (ROXICODONE) 5 mg/5 mL solution 20 mg: 20 mg | ORAL | @ 21:00:00 | Stop: 2023-05-05

## 2023-05-02 MED ADMIN — acetaminophen (TYLENOL) oral liquid: 1000 mg | ENTERAL | @ 10:00:00

## 2023-05-02 MED ADMIN — sodium chloride (NS) 0.9 % flush 10 mL: 10 mL | INTRAVENOUS | @ 04:00:00

## 2023-05-02 MED ADMIN — apixaban (ELIQUIS) tablet 5 mg: 5 mg | ORAL | @ 15:00:00

## 2023-05-02 MED ADMIN — baclofen (LIORESAL) tablet 5 mg: 5 mg | ORAL | @ 03:00:00

## 2023-05-02 MED ADMIN — promethazine (PHENERGAN) tablet 12.5 mg: 12.5 mg | ORAL | @ 21:00:00

## 2023-05-02 MED ADMIN — baclofen (LIORESAL) tablet 5 mg: 5 mg | ORAL | @ 19:00:00

## 2023-05-02 MED ADMIN — acetaminophen (TYLENOL) oral liquid: 1000 mg | ENTERAL | @ 03:00:00

## 2023-05-02 MED ADMIN — oxyCODONE (ROXICODONE) 5 mg/5 mL solution 20 mg: 20 mg | ORAL | @ 04:00:00 | Stop: 2023-05-05

## 2023-05-02 MED ADMIN — oxyCODONE (ROXICODONE) 5 mg/5 mL solution 20 mg: 20 mg | ORAL | @ 01:00:00 | Stop: 2023-05-05

## 2023-05-02 MED ADMIN — sodium chloride (NS) 0.9 % flush 10 mL: 10 mL | INTRAVENOUS | @ 21:00:00

## 2023-05-02 MED ADMIN — oxyCODONE (ROXICODONE) 5 mg/5 mL solution 20 mg: 20 mg | ORAL | @ 14:00:00 | Stop: 2023-05-05

## 2023-05-02 MED ADMIN — fluticasone propionate (FLONASE) 50 mcg/actuation nasal spray 1 spray: 1 | TOPICAL | @ 15:00:00

## 2023-05-02 MED ADMIN — HYDROmorphone (PF) (DILAUDID) injection 1 mg: 1 mg | INTRAVENOUS | @ 13:00:00 | Stop: 2023-05-05

## 2023-05-02 MED ADMIN — simethicone (MYLICON) chewable tablet 80 mg: 80 mg | ORAL | @ 10:00:00 | Stop: 2023-05-02

## 2023-05-02 MED ADMIN — cefdinir (OMNICEF) oral suspension: 300 mg | ORAL | @ 15:00:00 | Stop: 2023-05-06

## 2023-05-02 MED ADMIN — oxyCODONE (ROXICODONE) 5 mg/5 mL solution 20 mg: 20 mg | ORAL | @ 08:00:00 | Stop: 2023-05-05

## 2023-05-02 MED ADMIN — ondansetron (ZOFRAN) injection 8 mg: 8 mg | INTRAVENOUS | @ 03:00:00

## 2023-05-02 MED ADMIN — promethazine (PHENERGAN) tablet 12.5 mg: 12.5 mg | ORAL | @ 10:00:00

## 2023-05-02 MED ADMIN — promethazine (PHENERGAN) tablet 12.5 mg: 12.5 mg | ORAL | @ 15:00:00

## 2023-05-02 MED ADMIN — escitalopram oxalate (LEXAPRO) tablet 5 mg: 5 mg | ENTERAL | @ 15:00:00

## 2023-05-02 MED ADMIN — HYDROmorphone (PF) (DILAUDID) injection 1 mg: 1 mg | INTRAVENOUS | @ 06:00:00 | Stop: 2023-05-05

## 2023-05-02 MED ADMIN — HYDROmorphone (PF) (DILAUDID) injection 1 mg: 1 mg | INTRAVENOUS | @ 19:00:00 | Stop: 2023-05-05

## 2023-05-02 MED ADMIN — baclofen (LIORESAL) tablet 5 mg: 5 mg | ORAL | @ 15:00:00

## 2023-05-02 MED ADMIN — sucralfate (CARAFATE) oral suspension: 1 g | ORAL | @ 22:00:00

## 2023-05-02 NOTE — Unmapped (Signed)
 Patient admitted from [facility]. Tube feeds started. PRN given; see MAR. Mom at bedside. Enteric precautions maintained.     Problem: Adult Inpatient Plan of Care  Goal: Plan of Care Review  Outcome: Ongoing - Unchanged  Goal: Patient-Specific Goal (Individualized)  Outcome: Ongoing - Unchanged  Goal: Absence of Hospital-Acquired Illness or Injury  Outcome: Ongoing - Unchanged  Intervention: Identify and Manage Fall Risk  Recent Flowsheet Documentation  Taken 05/02/2023 0045 by Duane Boston, RN  Safety Interventions:   fall reduction program maintained   family at bedside   lighting adjusted for tasks/safety   low bed   nonskid shoes/slippers when out of bed   room near unit station  Intervention: Prevent Skin Injury  Recent Flowsheet Documentation  Taken 05/02/2023 0206 by Duane Boston, RN  Positioning for Skin: Supine/Back  Taken 05/02/2023 0045 by Duane Boston, RN  Positioning for Skin: Supine/Back  Goal: Optimal Comfort and Wellbeing  Outcome: Ongoing - Unchanged  Goal: Readiness for Transition of Care  Outcome: Ongoing - Unchanged  Goal: Rounds/Family Conference  Outcome: Ongoing - Unchanged

## 2023-05-02 NOTE — Unmapped (Signed)
 OCCUPATIONAL THERAPY  Evaluation (05/02/23 1239)    Patient Name:  [name] [name]       Medical Record Number: [id number]     Date of Birth: 17-Jul-2000  Sex: Female      Post-Discharge Occupational Therapy Recommendations: 3x weekly          Equipment Recommendation  OT DME Recommendations: None (owns DME)       OT Treatment Diagnosis: Generalized muscle weakness, Need for assistance with personal care, Limitation of activities due to disability         Assessment  Problem List: Decreased strength, Decreased activity tolerance, Decreased endurance, Decreased mobility, Impaired ADLs, Impaired balance, Decreased range of motion, Impaired fine motor skills, Fall risk  Personal Factors/Comorbidities (Occupational Profile and History Review): Expanded (Moderate)     Clinical Decision Making: Moderate Complexity    Assessment: Damien Cisar is a 23 y.o. female whose presentation is complicated by [name] c/b desmoid fibromatosis, recurrent ileus, GI bleeds, DVT, MDD, and GAD that presented to [facility] with C diff infection. Per Epic        Patient seen for initial OT evaluation and occupational profile. With consideration of patient's occupational profile, assessment review, level of clinical decision making involved, and intervention plan, patient presents as a mod complexity case w/ the following functional deficits: impaired dynamic balance , decreased UE strength , decreased LE strength , impaired UE ROM, and increased fall risk  that impact independent participation in ADLs. Pt reports recent need for assistance during ADL/IADLs, stair navigation and household level mobility with use of rollator. Pt utilizes manual wheelchair for community mobility. Pt with recent discharge from [facility] where pt/caregivers received training and necessary DME. Pt currently requires CGA-min A for ADLs and CGA for room level mobility with use of RW. Recommend post-acute OT 3x/week to maximize safety and functional independence.    Today's Interventions: ADL retraining, Education - Patient, Education - Family / caregiver, Functional mobility, Transfer training  Today's Interventions: Patient educated regarding: role of OT, OT POC, fall prevention strategies, and the role of daily participation in ADLs for maintaining/improving activity tolerance while in the hospital    Activity Tolerance During Today's Session  Tolerated treatment well    Plan  Planned Frequency of Treatment: Plan of Care Initiated: 05/02/23  1-2x per day Weekly Frequency: 3-4 days per week  Planned Treatment Duration: 05/16/23    Planned Interventions:  Education (Patient/Family/Caregiver), Home Exercise Program, Therapeutic Exercise, Therapeutic Activity, Self-Care/Home Training      GOALS:   Patient and Family Goals: None stated    Short Term:   SHORT GOAL #1: Pt will complete toilet transfer and hygiene with mod I   Time Frame : 2 weeks  SHORT GOAL #2: Pt will complete LB dressing with mod I and use of AE/compensatory strategies as needed   Time Frame : 2 weeks  SHORT GOAL #3: Pt will complete >5 minute standing ADL with mod I and use of LRAD   Time Frame : 2 weeks           Long Term Goal #1: Pt will score 20+/24 on AMPAC score  Time Frame: 3 weeks    Prognosis:  Good  Positive Indicators:  Motivation; family support  Barriers to Discharge: Impaired Balance, Inability to safely perform ADLS    Subjective  Medical Updates Since Last Visit/Relevant PMH Affecting Clinical Decision Making:    Prior Functional Status Pt lives with parents. Pt reports recent discharge from [facility] where she  requires assistance from parents for ADL/IADLs, transportation, stair navigation and functional mobility with use of rollator. Pt uses manual wheelchair for community mobility. Pt has not been independent since 2022 secondary to multiple hospital admissions. Pt is currently on a leave of abscence from nursing school. Pt reports a significant fall history mainly from not utilizing recommeded DME. Pt enjoys spending time with her therapy dog and friends.    Medical Tests / Procedures: Reviewed in [facility]  Services patient receives prior to admission: OT, PT (recently discharged from [facility])    Patient / Caregiver reports: I would like for them to come to my house at least once re: home health OT/PT      Past Medical History:   Diagnosis Date    Abdominal pain     Acid reflux     occas    Anesthesia complication     itching, shaking, coldness; last few surgeries have gone much better    Cancer (CMS-HCC)     Cataract of right eye     COVID-19 virus infection 01/2019    Cyst of thyroid determined by ultrasound     monitoring    [name]     2 right forearm, 1 left thigh, 1 right scapula, 1 under left clavicle; multiple    Difficult intravenous access     [name] (familial adenomatous polyposis)     [name]     Gastric polyps     History of chemotherapy     last treatment approx 05/2019    History of colon polyps     History of COVID-19 01/2019    Ileus (CMS-HCC) 03/16/2022    Iron deficiency anemia due to chronic blood loss     received iron infusion 11-2019    PONV (postoperative nausea and vomiting)     Rectal bleeding     Syncopal episodes     especially if becoming dehydrated    Social History     Tobacco Use    Smoking status: Never     Passive exposure: Past    Smokeless tobacco: Never   Substance Use Topics    Alcohol use: Never      Past Surgical History:   Procedure Laterality Date    COLON SURGERY      cyroablation      cystis removal      desmoid removal      PR CLOSE ENTEROSTOMY,RESEC+ANAST N/A 10/09/2020    Procedure: ILEOSTOMY TAKEDOWN;  Surgeon: Mickle Asper, MD;  Location: OR [place];  Service: General Surgery    PR COLONOSCOPY W/BIOPSY SINGLE/MULTIPLE N/A 10/27/2012    Procedure: COLONOSCOPY, FLEXIBLE, PROXIMAL TO SPLENIC FLEXURE; WITH BIOPSY, SINGLE OR MULTIPLE;  Surgeon: Shirlyn Goltz Mir, MD;  Location: PEDS PROCEDURE ROOM [place];  Service: Gastroenterology PR COLONOSCOPY W/BIOPSY SINGLE/MULTIPLE N/A 09/14/2013    Procedure: COLONOSCOPY, FLEXIBLE, PROXIMAL TO SPLENIC FLEXURE; WITH BIOPSY, SINGLE OR MULTIPLE;  Surgeon: Shirlyn Goltz Mir, MD;  Location: PEDS PROCEDURE ROOM ;  Service: Gastroenterology    PR COLONOSCOPY W/BIOPSY SINGLE/MULTIPLE N/A 11/08/2014    Procedure: COLONOSCOPY, FLEXIBLE, PROXIMAL TO SPLENIC FLEXURE; WITH BIOPSY, SINGLE OR MULTIPLE;  Surgeon: Arnold Long Mir, MD;  Location: PEDS PROCEDURE ROOM [facility];  Service: Gastroenterology    PR COLONOSCOPY W/BIOPSY SINGLE/MULTIPLE N/A 12/26/2015    Procedure: COLONOSCOPY, FLEXIBLE, PROXIMAL TO SPLENIC FLEXURE; WITH BIOPSY, SINGLE OR MULTIPLE;  Surgeon: Arnold Long Mir, MD;  Location: PEDS PROCEDURE ROOM [facility];  Service: Gastroenterology    PR COLONOSCOPY W/BIOPSY SINGLE/MULTIPLE N/A  09/02/2017    Procedure: COLONOSCOPY, FLEXIBLE, PROXIMAL TO SPLENIC FLEXURE; WITH BIOPSY, SINGLE OR MULTIPLE;  Surgeon: Arnold Long Mir, MD;  Location: PEDS PROCEDURE ROOM [facility];  Service: Gastroenterology    PR COLSC FLX W/REMOVAL LESION BY HOT BX FORCEPS N/A 08/27/2016    Procedure: COLONOSCOPY, FLEXIBLE, PROXIMAL TO SPLENIC FLEXURE; W/REMOVAL TUMOR/POLYP/OTHER LESION, HOT BX FORCEP/CAUTE;  Surgeon: Arnold Long Mir, MD;  Location: PEDS PROCEDURE ROOM [facility];  Service: Gastroenterology    PR COLSC FLX W/RMVL OF TUMOR POLYP LESION SNARE TQ N/A 02/25/2019    Procedure: COLONOSCOPY FLEX; W/REMOV TUMOR/LES BY SNARE;  Surgeon: Helyn Numbers, MD;  [facility] [place] [facility];  Service: Gastroenterology    PR COLSC FLX W/RMVL OF TUMOR POLYP LESION SNARE TQ N/A 03/13/2020    Procedure: COLONOSCOPY FLEX; W/REMOV TUMOR/LES BY SNARE;  Surgeon: Helyn Numbers, MD;  [facility] [place] [facility];  Service: Gastroenterology    PR EXC SKIN BENIG 2.1-3 CM TRUNK,ARM,LEG Right 02/25/2017    Procedure: EXCISION, BENIGN LESION INCLUDE MARGINS, EXCEPT SKIN TAG, LEGS; EXCISED DIAMETER 2.1 TO 3.0 CM;  Surgeon: Clarene Duke, MD;  Location: CHILDRENS OR [facility];  Service: Plastics    PR EXC SKIN BENIG 3.1-4 CM TRUNK,ARM,LEG Right 02/25/2017    Procedure: EXCISION, BENIGN LESION INCLUDE MARGINS, EXCEPT SKIN TAG, ARMS; EXCISED DIAMETER 3.1 TO 4.0 CM;  Surgeon: Clarene Duke, MD;  Location: CHILDRENS OR [facility];  Service: Plastics    PR EXC SKIN BENIG >4 CM FACE,FACIAL Right 02/25/2017    Procedure: EXCISION, OTHER BENIGN LES INCLUD MARGIN, FACE/EARS/EYELIDS/NOSE/LIPS/MUCOUS MEMBRANE; EXCISED DIAM >4.0 CM;  Surgeon: Clarene Duke, MD;  Location: CHILDRENS OR [facility];  Service: Plastics    PR EXC TUMOR SOFT TISSUE LEG/ANKLE SUBQ 3+CM Right 08/05/2019    Procedure: EXCISION, TUMOR, SOFT TISSUE OF LEG OR ANKLE AREA, SUBCUTANEOUS; 3 CM OR GREATER;  Surgeon: Arsenio Katz, MD;  Location: MAIN OR [place];  Service: Plastics    PR EXC TUMOR SOFT TISSUE LEG/ANKLE SUBQ <3CM Right 08/05/2019    Procedure: EXCISION, TUMOR, SOFT TISSUE OF LEG OR ANKLE AREA, SUBCUTANEOUS; LESS THAN 3 CM;  Surgeon: Arsenio Katz, MD;  Location: MAIN OR [facility];  Service: Plastics    PR LAP, SURG PROCTECTOMY W J-POUCH N/A 08/10/2020    Procedure: ROBOTIC ASSISTED LAPAROSCOPIC PROCTOCOLECTOMY, ILEAL J POUCH, WITH OSTOMY;  Surgeon: Mickle Asper, MD;  Location: OR [place];  Service: General Surgery    PR NDSC EVAL INTSTINAL POUCH DX W/COLLJ SPEC SPX N/A 01/23/2021    Procedure: ENDO EVAL SM INTEST POUCH; DX;  Surgeon: Modena Nunnery, MD;  [facility] [place] [facility];  Service: Gastroenterology    PR NDSC EVAL INTSTINAL POUCH DX W/COLLJ SPEC SPX N/A 08/27/2021    Procedure: ENDO EVAL SM INTEST POUCH; DX;  Surgeon: Hunt Oris, MD;  Location: [facility]URES [facility];  Service: Gastroenterology    PR NDSC EVAL INTSTINAL POUCH DX W/COLLJ SPEC SPX N/A 12/09/2021    Procedure: ENDO EVAL SM INTEST POUCH; DX;  Surgeon: Vidal Schwalbe, MD;  Location: GI [facility]URES [facility];  Service: Gastroenterology    PR NDSC EVAL INTSTINAL POUCH DX W/COLLJ [facility] SPX Left 04/09/2022    Procedure: ENDO EVAL SM INTEST POUCH; DX;  Surgeon: Modena Nunnery, MD;  [facility] [place] [facility] [place] [facility];  Service: Gastroenterology    PR NDSC EVAL INTSTINAL POUCH DX W/COLLJ SPEC SPX N/A 08/05/2022    Procedure: ENDO EVAL SM INTEST POUCH; DX;  Surgeon: Britt Bolognese  [name], MD;  Location: GI PROCEDURES [facility];  Service: Gastroenterology    PR NDSC EVAL INTSTINAL POUCH DX W/COLLJ SPEC SPX N/A 03/13/2023    Procedure: ENDO EVAL SM INTEST POUCH; DX;  Surgeon: Carmon Ginsberg, MD;  Location: GI PROC[facility]URES [facility];  Service: Gastroenterology    PR NDSC EVAL INTSTINAL POUCH W/BX SINGLE/MULTIPLE N/A 01/20/2022    Procedure: ENDOSCOPIC EVAL OF SMALL INTESTINAL POUCH; DIAGNOSTIC, No biopsies;  Surgeon: Andrey Farmer, MD;  Location: GI PROCEDURES [facility];  Service: Gastroenterology    PR NDSC EVAL INTSTINAL POUCH W/BX SINGLE/MULTIPLE N/A 02/13/2022    Procedure: ENDOSCOPIC EVAL OF SMALL INTESTINAL POUCH; DIAGNOSTIC, WITH BIOPSY;  Surgeon: Bronson Curb, MD;  Location: GI PROCEDURES [facility];  Service: Gastroenterology    PR NDSC EVAL INTSTINAL POUCH W/BX SINGLE/MULTIPLE N/A 03/13/2023    Procedure: ENDOSCOPIC EVAL OF SMALL INTESTINAL POUCH; DIAGNOSTIC, WITH BIOPSY;  Surgeon: Carmon Ginsberg, MD;  Location: GI PROCEDURES [facility];  Service: Gastroenterology    PR UNLISTED PROCEDURE SMALL INTESTINE  01/23/2021    Procedure: UNLISTED PROCEDURE, SMALL INTESTINE;  Surgeon: Modena Nunnery, MD;  [facility] [place] [facility]  Service: Gastroenterology    PR UNLISTED PROCEDURE SMALL INTESTINE  02/13/2022    Procedure: UNLISTED PROCEDURE, SMALL INTESTINE;  Surgeon: Bronson Curb, MD;  Location: GI PROCEDURES [facility];  Service: Gastroenterology    PR UPPER GI ENDOSCOPY,BIOPSY N/A 10/27/2012    Procedure: UGI ENDOSCOPY; WITH BIOPSY, SINGLE OR MULTIPLE;  Surgeon: Shirlyn Goltz Mir, MD; Location: PEDS PROCEDURE ROOM [facility];  Service: Gastroenterology    PR UPPER GI ENDOSCOPY,BIOPSY N/A 09/14/2013    Procedure: UGI ENDOSCOPY; WITH BIOPSY, SINGLE OR MULTIPLE;  Surgeon: Shirlyn Goltz Mir, MD;  Location: PEDS PROCEDURE ROOM [facility];  Service: Gastroenterology    PR UPPER GI ENDOSCOPY,BIOPSY N/A 11/08/2014    Procedure: UGI ENDOSCOPY; WITH BIOPSY, SINGLE OR MULTIPLE;  Surgeon: Arnold Long Mir, MD;  Location: PEDS PROCEDURE ROOM [facility];  Service: Gastroenterology    PR UPPER GI ENDOSCOPY,BIOPSY N/A 12/26/2015    Procedure: UGI ENDOSCOPY; WITH BIOPSY, SINGLE OR MULTIPLE;  Surgeon: Arnold Long Mir, MD;  Location: PEDS PROCEDURE ROOM [facility];  Service: Gastroenterology    PR UPPER GI ENDOSCOPY,BIOPSY N/A 08/27/2016    Procedure: UGI ENDOSCOPY; WITH BIOPSY, SINGLE OR MULTIPLE;  Surgeon: Arnold Long Mir, MD;  Location: PEDS PROCEDURE ROOM [facility];  Service: Gastroenterology    PR UPPER GI ENDOSCOPY,BIOPSY N/A 09/02/2017    Procedure: UGI ENDOSCOPY; WITH BIOPSY, SINGLE OR MULTIPLE;  Surgeon: Arnold Long Mir, MD;  Location: PEDS PROCEDURE ROOM [facility];  Service: Gastroenterology    PR UPPER GI ENDOSCOPY,BIOPSY N/A 03/13/2020    Procedure: UGI ENDOSCOPY; WITH BIOPSY, SINGLE OR MULTIPLE;  Surgeon: Helyn Numbers, MD;  [facility] [place] [facility];  Service: Gastroenterology    PR UPPER GI ENDOSCOPY,BIOPSY N/A 09/05/2021    Procedure: UGI ENDOSCOPY; WITH BIOPSY, SINGLE OR MULTIPLE;  Surgeon: Wendall Papa, MD;  Location: GI PROCEDURES [facility];  Service: Gastroenterology    PR UPPER GI ENDOSCOPY,DIAGNOSIS N/A 01/20/2022    Procedure: UGI ENDO, INCLUDE ESOPHAGUS, STOMACH, & DUODENUM &/OR JEJUNUM; DX W/WO COLLECTION SPECIMN, BY BRUSH OR WASH;  Surgeon: Andrey Farmer, MD;  Location: GI PROCEDURES [facility];  Service: Gastroenterology    TUMOR REMOVAL      multiple-head, neck, back, hand, right flank, multiple    Family History   Problem Relation Age of Onset    No Known Problems Mother     No  Known Problems Father     No Known Problems Sister     No Known Problems Brother     Stroke Maternal Grandmother     Other Maternal Grandmother         benign lesions of liver and pancreas, further details unknown    Cancer Maternal Grandmother     Diabetes Maternal Grandmother     Hypertension Maternal Grandmother     Thyroid disease Maternal Grandmother     Arthritis Maternal Grandfather     Asthma Maternal Grandfather     COPD Paternal Grandmother         Deceased    Miscarriages / Stillbirths Paternal Grandmother     Alcohol abuse Paternal Grandfather         Deceased    No Known Problems Maternal Aunt     No Known Problems Maternal Uncle     No Known Problems Paternal Aunt     No Known Problems Paternal Uncle     Anesthesia problems Neg Hx     Broken bones Neg Hx     Cancer Neg Hx     Clotting disorder Neg Hx     Collagen disease Neg Hx     Diabetes Neg Hx     Dislocations Neg Hx     Fibromyalgia Neg Hx     Gout Neg Hx     Hemophilia Neg Hx     Osteoporosis Neg Hx     Rheumatologic disease Neg Hx     Scoliosis Neg Hx     Severe sprains Neg Hx     Sickle cell anemia Neg Hx     Spinal Compression Fracture Neg Hx     Melanoma Neg Hx     Basal cell carcinoma Neg Hx     Squamous cell carcinoma Neg Hx         Adhesive tape-silicones; Ferrlecit [sodium ferric gluconat-sucrose]; Levofloxacin; Methylnaltrexone; Neomycin; Papaya; Morphine; Zosyn [piperacillin-tazobactam]; Compazine [prochlorperazine]; Iron analogues; Reglan [metoclopramide hcl]; Iron dextran; and Latex, natural rubber     Objective Findings  Precautions / Restrictions  Falls precautions, Bleeding Precautions, Isolation precautions (enteric)  enteric precautions    Weight Bearing  Non-applicable    Required Braces or Orthoses  LLE AFO - Ankle-foot orthoses, RLE AFO - Ankle-foot orthoses    Communication Preference  Verbal       Pain  Pt endorsed 7/10 abdominal and LUE pain, tx adjusted to tolerance    Equipment / Environment  Vascular access (PIV, TLC, Port-a-cath, PICC), NGT    Living Situation  Living Environment: House  Lives With: Father, Mother  Home Living: Multi-level home, Bed/bath upstairs, 1/2 bath on main level, Stairs to alternate level with rails, Stairs to enter without rails, Stairs to alternate level without rails, Handicapped height toilet, Tub/shower unit, Shower chair with back, Standard height toilet  Number of Stairs to Enter (outside): 2  Rail placement (inside): Rail on right side  Number of Stairs to Alternate level (inside): 14  Caregiver Identified?: Yes (parents)  Caregiver Availability: 24 hours  Caregiver Ability: Limited lifting  Equipment available at home: Rollator, Wheelchair-manual, Rolling walker, Reacher, Lift-recliner (shower chair with back)     Cognition   Orientation Level:  Oriented x 4   Arousal/Alertness:  Appropriate responses to stimuli   Attention Span:  Appears intact   Memory:  Appears intact   Following Commands:  Follows all commands and directions without difficulty   Safety Judgment:  Good awareness of safety  precautions   Awareness of Errors and Problem Solving:  Patient self-corrected errors, Able to problem solve independently   Comments:      Vision / Hearing   Vision: No acute deficits identified     Hearing: No deficit identified         Hand Function:  Right Hand Function: Right hand function impaired  Right Hand Impairment: grip strength fair  Left Hand Function: Left hand function impaired  Left Hand Impairment: grip strength fair  Hand Function comments: Decreased bilateral grip strength  Hand Dominance: Right    Skin Inspection:  Skin Inspection: Intact where visualized    Face/Cervical ROM:  Face ROM: WFL  Cervical ROM: WFL    ROM / Strength:  UE ROM/Strength: Left Impaired/Limited, Right Impaired/Limited  RUE Impairment: Reduced strength, Limited AROM  LUE Impairment: Reduced strength, Limited AROM  LE ROM/Strength: Right Impaired/Limited, Left Impaired/Limited  RLE Impairment: Reduced strength  LLE Impairment: Reduced strength    Coordination:  Coordination: WFL    Sensation:  Sensory/ Proprioception/ Stereognosis comments: Pt reports numbess in LUE, occasional neuropathy in BLEs    Balance:  Static Sitting-Level of Assistance: Independent  Dynamic Sitting-Level of Assistance: Supervision  Sitting Balance comments: unsupported sitting in chair    Static Standing-Level of Assistance: Contact guard  Dynamic Standing - Level of Assistance: Contact guard  Standing Balance comments: BUE support on RW    Functional Mobility  Transfers: Contact Guard assist (STS from chair with use of RW)  Bed Mobility - Needs Assistance:  (NT pt in chair upon arrival)  Ambulation: Pt completed room level mobility from bedside<>bathroom using RW with CGA    ADLs  ADLs: Needs assistance with ADLs  ADLs - Needs Assistance: Feeding, Grooming, Bathing, Toileting, UB dressing, LB dressing  Feeding - Needs Assistance: Set Up Assist  Grooming - Needs Assistance: Contact Guard assist, Performed standing (Pt brushed teeth while standing sinkside with CGA and unilateral UE support on RW for stability)  Bathing - Needs Assistance: Min assist  Toileting - Needs Assistance: Contact Guard assist  UB Dressing - Needs Assistance: Set Up Assist  LB Dressing - Needs Assistance: Min assist, Performed seated (Pt donned bilateral tennis shoes at chair level with setup A. Pt required min A to doff bilateral tennis shoes at chair level.)  IADLs: NT    Vitals / Orthostatics  Vitals/Orthostatics: Asymptomatic    Patient at end of session: All needs in reach, Friends/Family present, In chair, Lines intact, Notified Nurse     Occupational Therapy Session Duration  OT Individual [mins]: 38       AM-PAC-Daily Activity  Lower Body Dressing assistance needs: A Little - Minimal/Contact Guard Assist/Supervision  Bathing assistance needs: A Little - Minimal/Contact Guard Assist/Supervision  Toileting assistance needs: A Little - Minimal/Contact Guard Assist/Supervision  Upper Body Dressing assistance needs: A Little - Minimal/Contact Guard Assist/Supervision  Personal Grooming assistance needs: A Little - Minimal/Contact Guard Assist/Supervision  Eating Meals assistance needs: A Little - Minimal/Contact Guard Assist/Supervision    Daily Activity Score: 18    Score (in points): % of Functional Impairment, Limitation, Restriction  6: 100% impaired, limited, restricted  7-8: At least 80%, but less than 100% impaired, limited restricted  9-13: At least 60%, but less than 80% impaired, limited restricted  14-19: At least 40%, but less than 60% impaired, limited restricted  20-22: At least 20%, but less than 40% impaired, limited restricted  23: At least 1%, but less than 20% impaired, limited restricted  24: 0% impaired, limited restricted      I attest that I have reviewed the above information.  Signed: Sharyn Lull, OT  Filed 05/02/2023

## 2023-05-02 NOTE — Unmapped (Signed)
 PHYSICAL THERAPY  Evaluation (05/02/23 1115)          Patient Name:  [name] [name]       Medical Record Number: [id number]   Date of Birth: 02-Jan-2001  Sex: Female        Post-Discharge Physical Therapy Recommendations:  PT Post Acute Discharge Recommendations: Skilled PT services indicated, 3x weekly   Equipment Recommendation  PT DME Recommendations: None          Treatment Diagnosis: Abnormalities of gait and mobility, Difficulty in walking, Generalized muscle weakness, Unsteadiness on feet        Activity Tolerance: Limited by fatigue     ASSESSMENT  Problem List: Decreased strength, Impaired balance, Pain, Decreased endurance, Decreased mobility, Fall risk      Assessment : Pt is a 23 y/o F admitted from [facility] for C-diff. Pt admitted w/ hx of desmoid fibromatosis w/ abdominal surgeries. PMHx: recurrent ileus, GIB, DVT, MDD, GAD, and [name].Pt tolerates session well today, but is limited by generalized weakness. She appears to be functioning below her stated baseline, requiring assistance for STS, t/fs, and gait using RW. She endorses that she had made significant progress at [facility] and is concerned that she will lose this progress with current hospitalization. PT provided education regarding importance of out of bed to prevent deconditioning and encouraged pt and pt's mother to discuss mobility w/ nursing staff. Pt would benefit from ongoing skilled PT during hospitalization. At this time, pt anticipated to return home and would benefit from ongoing rehab services 3x weekly. Pt appears to have needed DME, but will continue to assess/address.      Today's Interventions: PT evaluation; education regarding PT POC, roles, and goals; Gait training using AFO's and walker; Functional mobility training.     Personal Factors/Comorbidities Present: 3+   Examination of Body System: Neurological, Activity/participation, Musculoskeletal, Cardiovascular  Clinical Presentation: Evolving    Clinical Decision Making: Moderate        PLAN  Planned Frequency of Treatment: Plan of Care Initiated: 05/02/23  1-2x per day Weekly Frequency: 5-6 days per week  Planned Treatment Duration: 05/30/23     Planned Interventions: Gait training, Education (Patient/Family/Caregiver), Home exercise program, Neuromuscular re-education, Therapeutic Exercise, Therapeutic Activity     Goals:   Patient and Family Goals: Maintain the improvements she gained at [facility].     SHORT GOAL #1: Pt will perform all bed mobility independently with bed flat.               Time Frame : 2 weeks  SHORT GOAL #2: Pt will perform bed to WC transfers using RW/Rollator w/ no more than SBA.              Time Frame : 2 weeks  SHORT GOAL #3: Pt will ambulate 37' w/ rollator w/ MIN A and no frank loss of balance.              Time Frame : 2 weeks  SHORT GOAL #4: Pt will ascend/descend 4 steps w/ MIN A.               Time Frame : 2 weeks    Long Term Goal #1: Pt will demonstrate AMPAC 6 clicks score of 22/24 to demonstrate improved functional mobility.  Time Frame: 4 weeks     Prognosis:  Excellent  Positive Indicators: CLOF, age, motivation, family support  Barriers to Discharge: Endurance deficits, Impaired Balance     SUBJECTIVE  Communication Preference: Verbal  Patient reports: Pt agreeable to PT evaluation, denying pain. pt reports that she has made significant progress at [facility] and hopes to maintain that while inpatient.  Pain Comments: Pt denies pain  Medical Updates Since Last Visit/Relevant PMH Affecting Clinical Decision Making: Pt t/f'd from [facility] due to C-diff  Services patient receives prior to admission: PT  Prior Functional Status: Prior admission, pt was a limited community ambulator, using a custom wheelchair to navigate community setting and using a rollator for household mobility. She required1 person assistance from her mother for stair negotiation. She was recently rehospitalized and DC'd to [facility] where she was ambulating w/ some assistance per pt and mother.  Equipment available at home: Rollator, Wheelchair-manual, Rolling walker        Past Medical History:   Diagnosis Date    Abdominal pain     Acid reflux     occas    Anesthesia complication     itching, shaking, coldness; last few surgeries have gone much better    Cancer (CMS-HCC)     Cataract of right eye     COVID-19 virus infection 01/2019    Cyst of thyroid determined by ultrasound     monitoring    [name]     2 right forearm, 1 left thigh, 1 right scapula, 1 under left clavicle; multiple    Difficult intravenous access     [name] (familial adenomatous polyposis)     [name]     Gastric polyps     History of chemotherapy     last treatment approx 05/2019    History of colon polyps     History of COVID-19 01/2019    Ileus (CMS-HCC) 03/16/2022    Iron deficiency anemia due to chronic blood loss     received iron infusion 11-2019    PONV (postoperative nausea and vomiting)     Rectal bleeding     Syncopal episodes     especially if becoming dehydrated            Social History     Tobacco Use    Smoking status: Never     Passive exposure: Past    Smokeless tobacco: Never   Substance Use Topics    Alcohol use: Never       Past Surgical History:   Procedure Laterality Date    COLON SURGERY      cyroablation      cystis removal      desmoid removal      PR CLOSE ENTEROSTOMY,RESEC+ANAST N/A 10/09/2020    Procedure: ILEOSTOMY TAKEDOWN;  Surgeon: Mickle Asper, MD;  Location: OR [place];  Service: General Surgery    PR COLONOSCOPY W/BIOPSY SINGLE/MULTIPLE N/A 10/27/2012    Procedure: COLONOSCOPY, FLEXIBLE, PROXIMAL TO SPLENIC FLEXURE; WITH BIOPSY, SINGLE OR MULTIPLE;  Surgeon: Shirlyn Goltz Mir, MD;  Location: PEDS PROCEDURE ROOM [place];  Service: Gastroenterology    PR COLONOSCOPY W/BIOPSY SINGLE/MULTIPLE N/A 09/14/2013    Procedure: COLONOSCOPY, FLEXIBLE, PROXIMAL TO SPLENIC FLEXURE; WITH BIOPSY, SINGLE OR MULTIPLE;  Surgeon: Shirlyn Goltz Mir, MD;  Location: PEDS PROCEDURE ROOM [place];  Service: Gastroenterology    PR COLONOSCOPY W/BIOPSY SINGLE/MULTIPLE N/A 11/08/2014    Procedure: COLONOSCOPY, FLEXIBLE, PROXIMAL TO SPLENIC FLEXURE; WITH BIOPSY, SINGLE OR MULTIPLE;  Surgeon: Arnold Long Mir, MD;  Location: PEDS PROCEDURE ROOM [facility];  Service: Gastroenterology    PR COLONOSCOPY W/BIOPSY SINGLE/MULTIPLE N/A 12/26/2015    Procedure: COLONOSCOPY, FLEXIBLE, PROXIMAL TO SPLENIC FLEXURE; WITH BIOPSY, SINGLE OR MULTIPLE;  Surgeon:  Arnold Long Mir, MD;  Location: PEDS PROCEDURE ROOM [facility];  Service: Gastroenterology    PR COLONOSCOPY W/BIOPSY SINGLE/MULTIPLE N/A 09/02/2017    Procedure: COLONOSCOPY, FLEXIBLE, PROXIMAL TO SPLENIC FLEXURE; WITH BIOPSY, SINGLE OR MULTIPLE;  Surgeon: Arnold Long Mir, MD;  Location: PEDS PROCEDURE ROOM [facility];  Service: Gastroenterology    PR COLSC FLX W/REMOVAL LESION BY HOT BX FORCEPS N/A 08/27/2016    Procedure: COLONOSCOPY, FLEXIBLE, PROXIMAL TO SPLENIC FLEXURE; W/REMOVAL TUMOR/POLYP/OTHER LESION, HOT BX FORCEP/CAUTE;  Surgeon: Arnold Long Mir, MD;  Location: PEDS PROCEDURE ROOM [facility];  Service: Gastroenterology    PR COLSC FLX W/RMVL OF TUMOR POLYP LESION SNARE TQ N/A 02/25/2019    Procedure: COLONOSCOPY FLEX; W/REMOV TUMOR/LES BY SNARE;  Surgeon: Helyn Numbers, MD;  Location: GI PROCEDURES [place] [facility];  Service: Gastroenterology    PR COLSC FLX W/RMVL OF TUMOR POLYP LESION SNARE TQ N/A 03/13/2020    Procedure: COLONOSCOPY FLEX; W/REMOV TUMOR/LES BY SNARE;  Surgeon: Helyn Numbers, MD;  Location: [facility] PROCEDURES [place] [facility];  Service: Gastroenterology    PR EXC SKIN BENIG 2.1-3 CM TRUNK,ARM,LEG Right 02/25/2017    Procedure: EXCISION, BENIGN LESION INCLUDE MARGINS, EXCEPT SKIN TAG, LEGS; EXCISED DIAMETER 2.1 TO 3.0 CM;  Surgeon: Clarene Duke, MD;  Location: CHILDRENS OR [facility];  Service: Plastics    PR EXC SKIN BENIG 3.1-4 CM TRUNK,ARM,LEG Right 02/25/2017    Procedure: EXCISION, BENIGN LESION INCLUDE MARGINS, EXCEPT SKIN TAG, ARMS; EXCISED DIAMETER 3.1 TO 4.0 CM;  Surgeon: Clarene Duke, MD;  Location: CHILDRENS OR [facility];  Service: Plastics    PR EXC SKIN BENIG >4 CM FACE,FACIAL Right 02/25/2017    Procedure: EXCISION, OTHER BENIGN LES INCLUD MARGIN, FACE/EARS/EYELIDS/NOSE/LIPS/MUCOUS MEMBRANE; EXCISED DIAM >4.0 CM;  Surgeon: Clarene Duke, MD;  Location: CHILDRENS OR [facility];  Service: Plastics    PR EXC TUMOR SOFT TISSUE LEG/ANKLE SUBQ 3+CM Right 08/05/2019    Procedure: EXCISION, TUMOR, SOFT TISSUE OF LEG OR ANKLE AREA, SUBCUTANEOUS; 3 CM OR GREATER;  Surgeon: Arsenio Katz, MD;  Location: MAIN OR [place];  Service: Plastics    PR EXC TUMOR SOFT TISSUE LEG/ANKLE SUBQ <3CM Right 08/05/2019    Procedure: EXCISION, TUMOR, SOFT TISSUE OF LEG OR ANKLE AREA, SUBCUTANEOUS; LESS THAN 3 CM;  Surgeon: Arsenio Katz, MD;  Location: MAIN OR [facility];  Service: Plastics    PR LAP, SURG PROCTECTOMY W J-POUCH N/A 08/10/2020    Procedure: ROBOTIC ASSISTED LAPAROSCOPIC PROCTOCOLECTOMY, ILEAL J POUCH, WITH OSTOMY;  Surgeon: Mickle Asper, MD;  Location: OR [place];  Service: General Surgery    PR NDSC EVAL INTSTINAL POUCH DX W/COLLJ SPEC SPX N/A 01/23/2021    Procedure: ENDO EVAL SM INTEST POUCH; DX;  Surgeon: Modena Nunnery, MD;  Location: [facility] PROCEDURES [place] [facility];  Service: Gastroenterology    PR NDSC EVAL INTSTINAL POUCH DX W/COLLJ SPEC SPX N/A 08/27/2021    Procedure: ENDO EVAL SM INTEST POUCH; DX;  Surgeon: Hunt Oris, MD;  [facility];  Service: Gastroenterology    PR NDSC EVAL INTSTINAL POUCH DX W/COLLJ SPEC SPX N/A 12/09/2021    Procedure: ENDO EVAL SM INTEST POUCH; DX;  Surgeon: Vidal Schwalbe, MD;  [facility]: [facility]URES [facility];  Service: Gastroenterology    PR NDSC EVAL INTSTINAL POUCH DX W/COLLJ [facility] SPX Left 04/09/2022    Procedure: ENDO EVAL SM INTEST POUCH; DX;  Surgeon: Modena Nunnery, MD;  Location: [facility] [place] [facility];  Service: Gastroenterology    PR NDSC EVAL INTSTINAL  POUCH DX W/COLLJ SPEC SPX N/A 08/05/2022    Procedure: ENDO EVAL SM INTEST POUCH; DX;  Surgeon: Modena Nunnery, MD;  Location: [facility]URES [facility];  Service: Gastroenterology    PR NDSC EVAL INTSTINAL POUCH DX W/COLLJ SPEC SPX N/A 03/13/2023    Procedure: ENDO EVAL SM INTEST POUCH; DX;  Surgeon: Carmon Ginsberg, MD;  Location: GI [facility]URES [facility];  Service: Gastroenterology    PR NDSC EVAL INTSTINAL POUCH W/BX SINGLE/MULTIPLE N/A 01/20/2022    Procedure: ENDOSCOPIC EVAL OF SMALL INTESTINAL POUCH; DIAGNOSTIC, No biopsies;  Surgeon: Andrey Farmer, MD;  Location: GI PROC[facility]URES [facility];  Service: Gastroenterology    PR NDSC EVAL INTSTINAL POUCH W/BX SINGLE/MULTIPLE N/A 02/13/2022    Procedure: ENDOSCOPIC EVAL OF SMALL INTESTINAL POUCH; DIAGNOSTIC, WITH BIOPSY;  Surgeon: Bronson Curb, MD;  Location: [facility]URES [facility];  Service: Gastroenterology    PR NDSC EVAL INTSTINAL POUCH W/BX SINGLE/MULTIPLE N/A 03/13/2023    Procedure: ENDOSCOPIC EVAL OF SMALL INTESTINAL POUCH; DIAGNOSTIC, WITH BIOPSY;  Surgeon: Carmon Ginsberg, MD;  Location: GI PROCEDURES [facility];  Service: Gastroenterology    PR UNLISTED PROCEDURE SMALL INTESTINE  01/23/2021    Procedure: UNLISTED PROCEDURE, SMALL INTESTINE;  Surgeon: Modena Nunnery, MD;  Location: [facility] [place] [facility];  Service: Gastroenterology    PR UNLISTED PROCEDURE SMALL INTESTINE  02/13/2022    Procedure: UNLISTED PROCEDURE, SMALL INTESTINE;  Surgeon: Bronson Curb, MD;  Location: GI PROCEDURES [facility];  Service: Gastroenterology    PR UPPER GI ENDOSCOPY,BIOPSY N/A 10/27/2012    Procedure: UGI ENDOSCOPY; WITH BIOPSY, SINGLE OR MULTIPLE;  Surgeon: Shirlyn Goltz Mir, MD;  Location: PEDS PROCEDURE ROOM [facility];  Service: Gastroenterology    PR UPPER GI ENDOSCOPY,BIOPSY N/A 09/14/2013    Procedure: UGI ENDOSCOPY; WITH BIOPSY, SINGLE OR MULTIPLE;  Surgeon: Shirlyn Goltz Mir, MD;  Location: PEDS PROCEDURE ROOM [facility];  Service: Gastroenterology    PR UPPER GI ENDOSCOPY,BIOPSY N/A 11/08/2014    Procedure: UGI ENDOSCOPY; WITH BIOPSY, SINGLE OR MULTIPLE;  Surgeon: Arnold Long Mir, MD;  Location: PEDS PROCEDURE ROOM [facility];  Service: Gastroenterology    PR UPPER GI ENDOSCOPY,BIOPSY N/A 12/26/2015    Procedure: UGI ENDOSCOPY; WITH BIOPSY, SINGLE OR MULTIPLE;  Surgeon: Arnold Long Mir, MD;  Location: PEDS PROCEDURE ROOM [name];  Service: Gastroenterology    PR UPPER GI ENDOSCOPY,BIOPSY N/A 08/27/2016    Procedure: UGI ENDOSCOPY; WITH BIOPSY, SINGLE OR MULTIPLE;  Surgeon: Arnold Long Mir, MD;  Location: PEDS PROCEDURE ROOM [facility];  Service: Gastroenterology    PR UPPER GI ENDOSCOPY,BIOPSY N/A 09/02/2017    Procedure: UGI ENDOSCOPY; WITH BIOPSY, SINGLE OR MULTIPLE;  Surgeon: Arnold Long Mir, MD;  Location: PEDS PROCEDURE ROOM [facility];  Service: Gastroenterology    PR UPPER GI ENDOSCOPY,BIOPSY N/A 03/13/2020    Procedure: UGI ENDOSCOPY; WITH BIOPSY, SINGLE OR MULTIPLE;  Surgeon: Helyn Numbers, MD;  Location: [facility] [place] [facility];  Service: Gastroenterology    PR UPPER GI ENDOSCOPY,BIOPSY N/A 09/05/2021    Procedure: UGI ENDOSCOPY; WITH BIOPSY, SINGLE OR MULTIPLE;  Surgeon: Wendall Papa, MD;  Location: GI PROC[facility]URES [facility];  Service: Gastroenterology    PR UPPER GI ENDOSCOPY,DIAGNOSIS N/A 01/20/2022    Procedure: UGI ENDO, INCLUDE ESOPHAGUS, STOMACH, & DUODENUM &/OR JEJUNUM; DX W/WO COLLECTION SPECIMN, BY BRUSH OR WASH;  Surgeon: Andrey Farmer, MD;  Location: GI [facility]URES [facility];  Service: Gastroenterology    TUMOR REMOVAL      multiple-head, neck, back, hand, right flank, multiple  Family History   Problem Relation Age of Onset    No Known Problems Mother     No Known Problems Father     No Known Problems Sister     No Known Problems Brother     Stroke Maternal Grandmother     Other Maternal Grandmother         benign lesions of liver and pancreas, further details unknown    Cancer Maternal Grandmother     Diabetes Maternal Grandmother     Hypertension Maternal Grandmother     Thyroid disease Maternal Grandmother     Arthritis Maternal Grandfather     Asthma Maternal Grandfather     COPD Paternal Grandmother         Deceased    Miscarriages / Stillbirths Paternal Grandmother     Alcohol abuse Paternal Grandfather         Deceased    No Known Problems Maternal Aunt     No Known Problems Maternal Uncle     No Known Problems Paternal Aunt     No Known Problems Paternal Uncle     Anesthesia problems Neg Hx     Broken bones Neg Hx     Cancer Neg Hx     Clotting disorder Neg Hx     Collagen disease Neg Hx     Diabetes Neg Hx     Dislocations Neg Hx     Fibromyalgia Neg Hx     Gout Neg Hx     Hemophilia Neg Hx     Osteoporosis Neg Hx     Rheumatologic disease Neg Hx     Scoliosis Neg Hx     Severe sprains Neg Hx     Sickle cell anemia Neg Hx     Spinal Compression Fracture Neg Hx     Melanoma Neg Hx     Basal cell carcinoma Neg Hx     Squamous cell carcinoma Neg Hx         Allergies: Adhesive tape-silicones; Ferrlecit [sodium ferric gluconat-sucrose]; Levofloxacin; Methylnaltrexone; Neomycin; Papaya; Morphine; Zosyn [piperacillin-tazobactam]; Compazine [prochlorperazine]; Iron analogues; Reglan [metoclopramide hcl]; Iron dextran; and Latex, natural rubber                  Objective Findings  Precautions / Restrictions  Precautions: Falls precautions (Enteric precautions)  Weight Bearing Status: Non-applicable  Required Braces or Orthoses: LLE AFO - Ankle-foot orthoses, RLE AFO - Ankle-foot orthoses        Equipment / Environment: Vascular access (PIV, TLC, Port-a-cath, PICC), NGT     Vitals/Orthostatics : BP 123/66     Living Situation  Living Environment: House  Lives With: Father, Mother  Home Living: Multi-level home, Bed/bath upstairs, 1/2 bath on main level, Stairs to alternate level with rails, Stairs to enter without rails, Stairs to alternate level without rails, Handicapped height toilet, Tub/shower unit, Able to Live on main level with bedroom/bathroom  Number of Stairs to Enter (outside): 2  Rail placement (inside): Rail on right side  Number of Stairs to Alternate level (inside): 14  Caregiver Identified?: Yes  Caregiver Availability: 24 hours  Caregiver Ability: Limited lifting (Pt's mother cleared to assist w/ t/fs at [facility] per pt and pt's mother)      Cognition: WFL  Cognition comment: alert, conversational  Orientation: Oriented x4  Visual/Perception: Within Functional Limits  Hearing: No deficit identified     Skin Inspection: Intact where visualized  Skin Inspection comment: Discussed frequent skin checks at BLEs  when wearing AFOs     Upper Extremities  UE ROM: Right WFL, Left WFL  RUE Strength Impairment: Reduced strength  LUE Strength Impairment: Reduced strength    Lower Extremities  LE comment: Bilateral hip flexion 3-/5; bilateral knee extension 3-/5 (however, pt able to stand using RW), ankle NT, but pt reports foot drop.          Coordination: WFL  Sensation: Tingling (intermittent)    Static Sitting-Level of Assistance: Supervision  Dynamic Sitting-Level of Assistance: Supervision  Sitting Balance comments: SBA for sitting balance    Static Standing-Level of Assistance: Minimum assistance, Contact guard  Dynamic Standing - Level of Assistance: Minimum assistance  Standing Balance comments: Using RW      Bed Mobility: Supine to Sit  Supine to Sit assistance level: Standby assist, set-up cues, supervision of patient - no hands on  Bed Mobility comments: HOB elevated     Transfers: Sit to Stand, Bed to Chair  Sit to Stand assistance level: Minimal assist, patient does 75% or more  Bed to Chair assistance level: Minimal assist, patient does 75% or more  Transfer comments: using RW, MIN A to initiate stand      Gait Level of Assistance: Minimal assist, patient does 75% or more  Gait Assistive Device: Rolling walker  Gait Distance Ambulated (ft): 30 ft  Skilled Treatment Performed: Slow, guarded gait w/ decreased Bilateral foot clearance. Pt appears to have RLE hyperextension in stance, controlled by anterior shell AFO. Wide BOS, slow gait speed, and excess use of UEs for support, but no frank loss of balance and pt manages RW well     Stairs: NT            Endurance: fair    Patient at end of session: All needs in reach, In bed    Physical Therapy Session Duration  PT Individual [mins]: 33          AM-PAC-6 click  Help currently need turning over In bed?: A Little - Minimal/Contact Guard Assist/Supervision (Sv)  Help currently needed sitting down/standing up from chair with arms? : A Little - Minimal/Contact Guard Assist/Supervision  Help currently needed moving from supine to sitting on edge of bed?: A Little - Minimal/Contact Guard Assist/Supervision     Help currently needed walking in a hospital room?: A Little - Minimal/Contact Guard Assist/Supervision  Help currently needed climbing 3-5 steps with railing?: Unable to do/total assistance - Total Dependent Assist         6 click Score (in points): % of Functional Impairment, Limitation, Restriction  6: 100% impaired, limited, restricted  7-8: At least 80%, but less than 100% impaired, limited restricted  9-13: At least 60%, but less than 80% impaired, limited restricted  14-19: At least 40%, but less than 60% impaired, limited restricted  20-22: At least 20%, but less than 40% impaired, limited restricted  23: At least 1%, but less than 20% impaired, limited restricted  24: 0% impaired, limited restricted    'AM-PAC' forms are Copyright protected by [employer] [facility]         I attest that I have reviewed the above information.  Signed: Oley Balm, PT  [facility] 05/02/2023

## 2023-05-02 NOTE — Unmapped (Signed)
 Pt's vitals stable. Pt's abdominal pain is ongoing and often around an 8. Pt is receiving IV Dilaudid and oxycodone 20 mg. Pt is receiving tube feeds at night at 50 per hour of Nutrin.   Problem: Adult Inpatient Plan of Care  Goal: Plan of Care Review  Outcome: Ongoing - Unchanged  Goal: Absence of Hospital-Acquired Illness or Injury  Outcome: Ongoing - Unchanged  Intervention: Prevent Skin Injury  Recent Flowsheet Documentation  Taken 05/02/2023 1100 by Julianne Handler, RN  Positioning for Skin: Sitting in Chair  Goal: Optimal Comfort and Wellbeing  Outcome: Ongoing - Unchanged  Goal: Readiness for Transition of Care  Outcome: Ongoing - Unchanged     Problem: Infection  Goal: Absence of Infection Signs and Symptoms  Outcome: Progressing     Problem: Self-Care Deficit  Goal: Improved Ability to Complete Activities of Daily Living  Outcome: Ongoing - Unchanged     Problem: Fall Injury Risk  Goal: Absence of Fall and Fall-Related Injury  Outcome: Progressing     Problem: Malnutrition  Goal: Improved Nutritional Intake  Outcome: Ongoing - Unchanged

## 2023-05-02 NOTE — Unmapped (Signed)
 22 from [facility] [name] c/b desmoid fibromatosis (s/p proctocolectomy, ileoanal anastamosis), pouchitis (GI bleed), DVT pw Cdif. highly complex pain regimen. request recs while inpatient. Levette Paulick MedO [phone]

## 2023-05-02 NOTE — Unmapped (Signed)
 Adult Nutrition Assessment Note    Visit Type: MD Consult, RN Consult  Reason for Visit: Enteral Nutrition, Have you had a decrease in food intake or appetite?, Have you gained or lost 10 pounds in the past 3 months?    NUTRITION INTERVENTIONS and RECOMMENDATION      Continue Nutren 2.0 at goal rate 50 mL/hr cycled over 12 hrs. This provides 1200 kcals, 51 g protein, 130 g carbohydrate, 56 g fat, 0 g fiber, 414 mL free water,  Continue FWF via cor-pak 75mL q 3 hrs  Continue oral diet  Increased pediasure vanilla BID  Restarted planned snacks:  PBJ Sandwich, banana BID  Need weight status    NUTRITION ASSESSMENT     Pt continued scheduled nausea meds. Currently controlled  Beneficial to continue multivitamin as low nutrition status  Pt would benefit continue enteral feeding via cor-pak as was not reaching energy needs orally.   Current TF nutren 2.0 is appropriate  Pt would benefit updated weight to assess progress with nutrition and potential discontinuance of TF's      NUTRITIONALLY RELEVANT DATA     HPI & PMH:   Per record, [name] [name] is a 23 y.o. female whose presentation is complicated by [name] c/b desmoid fibromatosis, recurrent ileus, GI bleeds, DVT, MDD, and GAD that presented to [facility] with C diff infection.   Past Medical History:   Diagnosis Date    Abdominal pain     Acid reflux     occas    Anesthesia complication     itching, shaking, coldness; last few surgeries have gone much better    Cancer (CMS-HCC)     Cataract of right eye     COVID-19 virus infection 01/2019    Cyst of thyroid determined by ultrasound     monitoring    [name]     2 right forearm, 1 left thigh, 1 right scapula, 1 under left clavicle; multiple    Difficult intravenous access     [name] (familial adenomatous polyposis)     [name]     Gastric polyps     History of chemotherapy     last treatment approx 05/2019    History of colon polyps     History of COVID-19 01/2019    Ileus (CMS-HCC) 03/16/2022    Iron deficiency anemia due to chronic blood loss     received iron infusion 11-2019    PONV (postoperative nausea and vomiting)     Rectal bleeding     Syncopal episodes     especially if becoming dehydrated         Nutrition History:   Pt is well known to Nutrition Services with chronic nausea and low oral intake.    Pt today reports ongoing issues with nausea and oral intake. From her last hospital admission and returning home she had lossed 8#. She was having up to 20# stools per day. She now reports stools are improved, to 10 or less per day. She has had recent cor-pak placed in duodenum 04/07/23. Her cyclic TF's vis cor-pak have recently been reduced from 16-hrs to currently 12hrs in recent days. She has been tolerating. She is currently fairing better with PBJ, bananas, oatmeal and pediasure orally.      Medications:  Nutritionally pertinent medications reviewed and evaluated for potential food and/or medication interactions.     Labs:   Nutritionally pertinent labs reviewed.   Lab Results   Component Value Date  NA 143 05/01/2023    K 4.1 05/01/2023    CL 104 05/01/2023    CO2 29.7 05/01/2023    BUN 14 05/01/2023    CREATININE 0.46 (L) 05/01/2023    GFR UNABLE TO CALCULATE GFR.BASED ON PATIENT AGE 21/09/2011    GLU 97 05/01/2023    CALCIUM 9.5 05/01/2023    ALBUMIN 3.7 04/10/2023    PHOS 4.8 05/01/2023     \    Nutritional Needs:   Energy: 1610-9604 kcals 30-35 kcal/kg using other (comment) (weight from 01/17), 51.3 kg (04/13/23 1440)]  Protein: 62-77 gm [1.2-1.5 gm/kg using last recorded weight (weight from 01/17), 51.3 kg (04/13/23 1440)]  Carbohydrate:   [no restriction]  Fluid:   mL [1 mL/kcal (maintenance)]    Anthropometric Data:  Height:     Admission weight:    Last recorded weight:    Date of last recorded weight: 04/06/23  IBW:    BMI: There is no height or weight on file to calculate BMI.   Usual Body Weight:  130# over 1 year ago  Weight Assessment: 12.4% loss from 09/24/22 to 04/06/23--significant    Wt Readings from Last 12 Encounters:   04/06/23 54.4 kg (120 lb)   03/24/23 52.8 kg (116 lb 6.4 oz)   03/17/23 53.5 kg (118 lb)   02/24/23 54.1 kg (119 lb 4.8 oz)   01/23/23 57.4 kg (126 lb 9.6 oz)   01/21/23 58 kg (127 lb 12.8 oz)   01/14/23 58 kg (127 lb 12.8 oz)   12/16/22 58.5 kg (129 lb)   12/16/22 58.5 kg (129 lb)   12/08/22 61.3 kg (135 lb 3.2 oz)   11/14/22 61 kg (134 lb 8 oz)   09/24/22 62.4 kg (137 lb 9.6 oz)         Malnutrition Assessment:  Malnutrition Assessment using AND/ASPEN or GLIM Clinical Characteristics:    Non-severe (Moderate) Protein-Calorie Malnutrition in the context of chronic illness (05/02/23 1725)  Energy Intake: < 75% of estimated energy requirement for > or equal to 1 month  Interpretation of Wt. Loss: > or equal to 10% x 6 month  Fat Loss: Mild  Muscle Loss: Mild  Malnutrition Score: 4            Nutrition Focused Physical Exam: 04/13/23 exam  Fat Areas Examined  Upper Arm: Mild loss                   Muscle Areas Examined  Temple: Mild loss  Clavicle: Mild loss  Patellar: Mild loss  Anterior Thigh: Mild loss       Care plan:  Completed    Current Nutrition:  Oral intake   Enteral nutrition via Corpak   Nutrition Orders            Supplement Pediatric; Pediasure (PED Standard); # of Products PER Serving: 1 2xd PC starting at 02/22 1800    Nutren 2.0 cyclic tube feed starting at 02/22 0100    Nutrition Therapy Regular/House starting at 02/22 0055            Nutritionally Pertinent Allergies, Intolerances, Sensitivities, and/or Cultural/Religious Restrictions:  none identified at this time     GOALS and EVALUATION     Patient to meet 85% or greater of nutritional needs via combination of meals, snacks, enteral  and/or oral supplements within admission.  - New    Motivation, Barriers, and Compliance:  Evaluation of motivation, barriers, and compliance completed. No concerns identified at this time.  Discharge Planning:   Monitor via CAPP rounds for any discharge planning needs.  Monitor for potential discharge needs with multi-disciplinary team.          Follow-Up Parameters:   1-2 times per week (and more frequent as indicated)    Ed Blalock, MS, RD, CSO, LDN  Pager # [phone]

## 2023-05-02 NOTE — Unmapped (Signed)
 Oncology (MEDO) History & Physical    Assessment & Plan:   [name] [name] is a 23 y.o. female whose presentation is complicated by [name] c/b desmoid fibromatosis, recurrent ileus, GI bleeds, DVT, MDD, and GAD that presented to [facility] with C diff infection.     Principal Problem:    C. difficile diarrhea  Active Problems:    [name]    Acute deep vein thrombosis (DVT) of axillary vein of right upper extremity (CMS-HCC)    Severe protein-calorie malnutrition (CMS-HCC)    Lower abdominal pain    Generalized anxiety disorder with panic attacks    MDD (major depressive disorder)        Active Problems    C Difficile Infection  Patient diagnosed with C diff via stool sample collected on 2/19. Started on PO Vancomycin on that date with plans for 14 day course. Since starting the Vancomycin, diarrhea has started to improve.  -Continue PO Vancomycin, plan for 14d course (2/19-3/5)    Acute on chronic abdominal pain and nausea  Complex situation discussed at length in prior notes. In brief, her pain and nausea are (deeply) multifactorial but consensus among treatment team is that new / unknown organic issue is unlikely. Plan to focus on symptom control, adequate nutrition.  Patient is currently at goal tube feeding, tolerating well.  No visible emesis noted.  Still reports persistent nausea but on aggressive nausea management as below. Will need to consult chronic pain service here at [facility] in AM.   -NO CHANGES to pain regimen without multiparty discussion including patient, unless true emergency.  -Pain management (continued from [facility]): Tylenol 1g q8h scheduled, Buprenorphine patch 25mcg/hr weekly, Oxycodone 20 mg q4h PRN, Dilaudid 1 mg q6h PRN 2nd line  -Spasticity (continued from [facility]): Baclofen 5mg  TID (started in rehab). We discussed valium but never used this for spasticity while inpatient as she has a contract with outpatient pain provider  -Nausea (continued from [facility]): Continue IV zofran PRN, PO Phenergan q6h scheduled. Used IV Phenergan at [name], but was transitioned to oral 2/17. Also used scopolamine. She refused the zyprexa nightly.   -Dyspepsia (continued from [facility]): Pepcid BID, Protonix BID  -GI motility (continued from [facility]): used simethicone TID PRN and naloxegol PRN  -Chronic pain consult in AM  -GI consult in AM    DVT of LUE  Had LUE DVT diagnosed via PVL on 04/06/2023 associated with PICC line. Has been on Eliquis since that time.   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 [name]. Patient has numerous sites of disease but most bothersome has been mesenteric sites which have caused chronic abdominal pain, nausea/vomiting. Please see Dr. Brynda Rim note from 1/14 addended 1/15 with his very thoughtful assessment of this situation and current hospitalization. AIR had continued home nirogacestat as tolerated. Patient tried holding it for a few days to see if it helped abdominal symptoms. okay to hold nirogesat while having difficulty with oral meds, no urgency to resume per Dr [name] .    Chronic Problems    GAD - MDD  -Continue Lexapro             Issues Impacting Complexity of Management:              Checklist:  Diet: Regular Diet and Tube Feeds  DVT PPx: Patient Already on Full Anticoagulation with Eliquis  Code  Status: Full Code  Dispo: Patient appropriate for Observation based on expectation of ongoing need for hospitalization less than two midnights and/or low intensity of services provided    Team Contact Information:   Primary Team: Oncology (MEDO)  Primary Resident: Cydney Ok, MD  Resident's Pager: [phone] (Oncology Intern - Alvester Morin)    Chief Concern:   C. difficile diarrhea      Subjective:   [name] [name] is a 23 y.o. female with pertinent PMHx of [name] c/b desmoid fibromatosis, recurrent ileus, GI bleeds, DVT, MDD, and GAD presenting with C diff infection History obtained by myself from patient and mother at bedside.       HPI:    Patient has a complex history of abdominal pain that makes it challenging to sort out current presentation. Of note, she was at [facility] for AIR and had been doing well up until Thursday 2/13. Since that day, she began to feel weaker, had less appetite, was passing more bowel movements which had become watery, and was having episodes of bowel incontinence. She was eventually tested for C diff, which came back positive, so she was started on PO Vanc on 2/19. Since that day, she has noticed incremental improvements in terms of decreased frequency of bowel movements, more formed stools, no more incontinence, and mild improvements in appetite.    ROS otherwise revealing for variety of concerns, including cysts on and around R collarbone, increased abdominal wall fluid retention localized to R inferior rib cage.      Pertinent Surgical Hx  Past Surgical History:   Procedure Laterality Date    COLON SURGERY      cyroablation      cystis removal      desmoid removal      PR CLOSE ENTEROSTOMY,RESEC+ANAST N/A 10/09/2020    Procedure: ILEOSTOMY TAKEDOWN;  Surgeon: Mickle Asper, MD;  Location: OR [place];  Service: General Surgery    PR COLONOSCOPY W/BIOPSY SINGLE/MULTIPLE N/A 10/27/2012    Procedure: COLONOSCOPY, FLEXIBLE, PROXIMAL TO SPLENIC FLEXURE; WITH BIOPSY, SINGLE OR MULTIPLE;  Surgeon: Shirlyn Goltz Mir, MD;  Location: PEDS PROCEDURE ROOM [facility];  Service: Gastroenterology    PR COLONOSCOPY W/BIOPSY SINGLE/MULTIPLE N/A 09/14/2013    Procedure: COLONOSCOPY, FLEXIBLE, PROXIMAL TO SPLENIC FLEXURE; WITH BIOPSY, SINGLE OR MULTIPLE;  Surgeon: Shirlyn Goltz Mir, MD;  Location: PEDS PROCEDURE ROOM [facility];  Service: Gastroenterology    PR COLONOSCOPY W/BIOPSY SINGLE/MULTIPLE N/A 11/08/2014    Procedure: COLONOSCOPY, FLEXIBLE, PROXIMAL TO SPLENIC FLEXURE; WITH BIOPSY, SINGLE OR MULTIPLE;  Surgeon: Arnold Long Mir, MD;  Location: PEDS PROCEDURE ROOM [facility];  Service: Gastroenterology    PR COLONOSCOPY W/BIOPSY SINGLE/MULTIPLE N/A 12/26/2015    Procedure: COLONOSCOPY, FLEXIBLE, PROXIMAL TO SPLENIC FLEXURE; WITH BIOPSY, SINGLE OR MULTIPLE;  Surgeon: Arnold Long Mir, MD;  Location: PEDS PROCEDURE ROOM [facility];  Service: Gastroenterology    PR COLONOSCOPY W/BIOPSY SINGLE/MULTIPLE N/A 09/02/2017    Procedure: COLONOSCOPY, FLEXIBLE, PROXIMAL TO SPLENIC FLEXURE; WITH BIOPSY, SINGLE OR MULTIPLE;  Surgeon: Arnold Long Mir, MD;  Location: PEDS PROCEDURE ROOM [place];  Service: Gastroenterology    PR COLSC FLX W/REMOVAL LESION BY HOT BX FORCEPS N/A 08/27/2016    Procedure: COLONOSCOPY, FLEXIBLE, PROXIMAL TO SPLENIC FLEXURE; W/REMOVAL TUMOR/POLYP/OTHER LESION, HOT BX FORCEP/CAUTE;  Surgeon: Arnold Long Mir, MD;  Location: PEDS PROCEDURE ROOM [facility];  Service: Gastroenterology    PR COLSC FLX W/RMVL OF TUMOR POLYP LESION SNARE TQ N/A 02/25/2019    Procedure: COLONOSCOPY FLEX; W/REMOV TUMOR/LES BY SNARE;  Surgeon: Helyn Numbers,  MD;  [place] PROCEDURES [place];  Service: Gastroenterology    PR COLSC FLX W/RMVL OF TUMOR POLYP LESION SNARE TQ N/A 03/13/2020    Procedure: COLONOSCOPY FLEX; W/REMOV TUMOR/LES BY SNARE;  Surgeon: Helyn Numbers, MD;  [facility] [facility];  Service: Gastroenterology    PR EXC SKIN BENIG 2.1-3 CM TRUNK,ARM,LEG Right 02/25/2017    Procedure: EXCISION, BENIGN LESION INCLUDE MARGINS, EXCEPT SKIN TAG, LEGS; EXCISED DIAMETER 2.1 TO 3.0 CM;  Surgeon: Clarene Duke, MD;  Location: CHILDRENS OR [facility];  Service: Plastics    PR EXC SKIN BENIG 3.1-4 CM TRUNK,ARM,LEG Right 02/25/2017    Procedure: EXCISION, BENIGN LESION INCLUDE MARGINS, EXCEPT SKIN TAG, ARMS; EXCISED DIAMETER 3.1 TO 4.0 CM;  Surgeon: Clarene Duke, MD;  Location: CHILDRENS OR [facility];  Service: Plastics    PR EXC SKIN BENIG >4 CM FACE,FACIAL Right 02/25/2017    Procedure: EXCISION, OTHER BENIGN LES INCLUD MARGIN, FACE/EARS/EYELIDS/NOSE/LIPS/MUCOUS MEMBRANE; EXCISED DIAM >4.0 CM;  Surgeon: Clarene Duke, MD;  Location: CHILDRENS OR [facility];  Service: Plastics    PR EXC TUMOR SOFT TISSUE LEG/ANKLE SUBQ 3+CM Right 08/05/2019    Procedure: EXCISION, TUMOR, SOFT TISSUE OF LEG OR ANKLE AREA, SUBCUTANEOUS; 3 CM OR GREATER;  Surgeon: Arsenio Katz, MD;  Location: MAIN OR [place];  Service: Plastics    PR EXC TUMOR SOFT TISSUE LEG/ANKLE SUBQ <3CM Right 08/05/2019    Procedure: EXCISION, TUMOR, SOFT TISSUE OF LEG OR ANKLE AREA, SUBCUTANEOUS; LESS THAN 3 CM;  Surgeon: Arsenio Katz, MD;  Location: MAIN OR [facility];  Service: Plastics    PR LAP, SURG PROCTECTOMY W J-POUCH N/A 08/10/2020    Procedure: ROBOTIC ASSISTED LAPAROSCOPIC PROCTOCOLECTOMY, ILEAL J POUCH, WITH OSTOMY;  Surgeon: Mickle Asper, MD;  Location: OR [place];  Service: General Surgery    PR NDSC EVAL INTSTINAL POUCH DX W/COLLJ SPEC SPX N/A 01/23/2021    Procedure: ENDO EVAL SM INTEST POUCH; DX;  Surgeon: Modena Nunnery, MD;  Location: [facility] PROCEDURES [place] [facility];  Service: Gastroenterology    PR NDSC EVAL INTSTINAL POUCH DX W/COLLJ SPEC SPX N/A 08/27/2021    Procedure: ENDO EVAL SM INTEST POUCH; DX;  Surgeon: Hunt Oris, MD;  Location: GI [facility]URES [facility];  Service: Gastroenterology    PR NDSC EVAL INTSTINAL POUCH DX W/COLLJ SPEC SPX N/A 12/09/2021    Procedure: ENDO EVAL SM INTEST POUCH; DX;  Surgeon: Vidal Schwalbe, MD;  Location: GI PROC[facility]URES [facility];  Service: Gastroenterology    PR NDSC EVAL INTSTINAL POUCH DX W/COLLJ [facility] SPX Left 04/09/2022    Procedure: ENDO EVAL SM INTEST POUCH; DX;  Surgeon: Modena Nunnery, MD;  Location: [facility] [place] [facility];  Service: Gastroenterology    PR NDSC EVAL INTSTINAL POUCH DX W/COLLJ SPEC SPX N/A 08/05/2022    Procedure: ENDO EVAL SM INTEST POUCH; DX;  Surgeon: Modena Nunnery, MD;  Location: GI PROCEDURES [facility];  Service: Gastroenterology    PR NDSC EVAL INTSTINAL POUCH DX W/COLLJ SPEC SPX N/A 03/13/2023    Procedure: ENDO EVAL SM INTEST POUCH; DX;  Surgeon: Carmon Ginsberg, MD;  Location: GI PROC[facility]URES [facility];  Service: Gastroenterology    PR NDSC EVAL INTSTINAL POUCH W/BX SINGLE/MULTIPLE N/A 01/20/2022    Procedure: ENDOSCOPIC EVAL OF SMALL INTESTINAL POUCH; DIAGNOSTIC, No biopsies;  Surgeon: Andrey Farmer, MD;  Location: GI PROCEDURES [facility];  Service: Gastroenterology    PR NDSC EVAL INTSTINAL POUCH W/BX SINGLE/MULTIPLE N/A 02/13/2022    Procedure:  ENDOSCOPIC EVAL OF SMALL INTESTINAL POUCH; DIAGNOSTIC, WITH BIOPSY;  Surgeon: Bronson Curb, MD;  Location: GI PROCEDURES [facility] [place];  Service: Gastroenterology    PR NDSC EVAL INTSTINAL POUCH W/BX SINGLE/MULTIPLE N/A 03/13/2023    Procedure: ENDOSCOPIC EVAL OF SMALL INTESTINAL POUCH; DIAGNOSTIC, WITH BIOPSY;  Surgeon: Carmon Ginsberg, MD;  Location: GI PROCEDURES [facility];  Service: Gastroenterology    PR UNLISTED PROCEDURE SMALL INTESTINE  01/23/2021    Procedure: UNLISTED PROCEDURE, SMALL INTESTINE;  Surgeon: Modena Nunnery, MD;  Location: [facility] [place] [facility];  Service: Gastroenterology    PR UNLISTED PROCEDURE SMALL INTESTINE  02/13/2022    Procedure: UNLISTED PROCEDURE, SMALL INTESTINE;  Surgeon: Bronson Curb, MD;  Location: GI PROCEDURES [facility];  Service: Gastroenterology    PR UPPER GI ENDOSCOPY,BIOPSY N/A 10/27/2012    Procedure: UGI ENDOSCOPY; WITH BIOPSY, SINGLE OR MULTIPLE;  Surgeon: Shirlyn Goltz Mir, MD;  Location: PEDS PROCEDURE ROOM [facility];  Service: Gastroenterology    PR UPPER GI ENDOSCOPY,BIOPSY N/A 09/14/2013    Procedure: UGI ENDOSCOPY; WITH BIOPSY, SINGLE OR MULTIPLE;  Surgeon: Shirlyn Goltz Mir, MD;  Location: PEDS PROCEDURE ROOM [facility];  Service: Gastroenterology    PR UPPER GI ENDOSCOPY,BIOPSY N/A 11/08/2014    Procedure: UGI ENDOSCOPY; WITH BIOPSY, SINGLE OR MULTIPLE;  Surgeon: Arnold Long Mir, MD;  Location: PEDS PROCEDURE ROOM [facility];  Service: Gastroenterology PR UPPER GI ENDOSCOPY,BIOPSY N/A 12/26/2015    Procedure: UGI ENDOSCOPY; WITH BIOPSY, SINGLE OR MULTIPLE;  Surgeon: Arnold Long Mir, MD;  Location: PEDS PROCEDURE ROOM [facility];  Service: Gastroenterology    PR UPPER GI ENDOSCOPY,BIOPSY N/A 08/27/2016    Procedure: UGI ENDOSCOPY; WITH BIOPSY, SINGLE OR MULTIPLE;  Surgeon: Arnold Long Mir, MD;  Location: PEDS PROCEDURE ROOM [facility];  Service: Gastroenterology    PR UPPER GI ENDOSCOPY,BIOPSY N/A 09/02/2017    Procedure: UGI ENDOSCOPY; WITH BIOPSY, SINGLE OR MULTIPLE;  Surgeon: Arnold Long Mir, MD;  Location: PEDS PROCEDURE ROOM [facility];  Service: Gastroenterology    PR UPPER GI ENDOSCOPY,BIOPSY N/A 03/13/2020    Procedure: UGI ENDOSCOPY; WITH BIOPSY, SINGLE OR MULTIPLE;  Surgeon: Helyn Numbers, MD;  Location: [facility] [place] [facility];  Service: Gastroenterology    PR UPPER GI ENDOSCOPY,BIOPSY N/A 09/05/2021    Procedure: UGI ENDOSCOPY; WITH BIOPSY, SINGLE OR MULTIPLE;  Surgeon: Wendall Papa, MD;  Location: GI PROC[facility]URES [facility];  Service: Gastroenterology    PR UPPER GI ENDOSCOPY,DIAGNOSIS N/A 01/20/2022    Procedure: UGI ENDO, INCLUDE ESOPHAGUS, STOMACH, & DUODENUM &/OR JEJUNUM; DX W/WO COLLECTION SPECIMN, BY BRUSH OR WASH;  Surgeon: Andrey Farmer, MD;  Location: GI PROCEDURES [facility];  Service: Gastroenterology    TUMOR REMOVAL      multiple-head, neck, back, hand, right flank, multiple         Pertinent Family Hx  Family History   Problem Relation Age of Onset    No Known Problems Mother     No Known Problems Father     No Known Problems Sister     No Known Problems Brother     Stroke Maternal Grandmother     Other Maternal Grandmother         benign lesions of liver and pancreas, further details unknown    Cancer Maternal Grandmother     Diabetes Maternal Grandmother     Hypertension Maternal Grandmother     Thyroid disease Maternal Grandmother     Arthritis Maternal Grandfather     Asthma Maternal Grandfather     COPD Paternal Grandmother  Deceased    Miscarriages / Stillbirths Paternal Grandmother     Alcohol abuse Paternal Grandfather         Deceased    No Known Problems Maternal Aunt     No Known Problems Maternal Uncle     No Known Problems Paternal Aunt     No Known Problems Paternal Uncle     Anesthesia problems Neg Hx     Broken bones Neg Hx     Cancer Neg Hx     Clotting disorder Neg Hx     Collagen disease Neg Hx     Diabetes Neg Hx     Dislocations Neg Hx     Fibromyalgia Neg Hx     Gout Neg Hx     Hemophilia Neg Hx     Osteoporosis Neg Hx     Rheumatologic disease Neg Hx     Scoliosis Neg Hx     Severe sprains Neg Hx     Sickle cell anemia Neg Hx     Spinal Compression Fracture Neg Hx     Melanoma Neg Hx     Basal cell carcinoma Neg Hx     Squamous cell carcinoma Neg Hx          Pertinent Social Hx   Social History     Socioeconomic History    Marital status: Single    Number of children: 0    Years of education: 15    Highest education level: Some college, no degree   Tobacco Use    Smoking status: Never     Passive exposure: Past    Smokeless tobacco: Never   Vaping Use    Vaping status: Never Used   Substance and Sexual Activity    Alcohol use: Never    Drug use: Never    Sexual activity: Never   Other Topics Concern    Do you use sunscreen? Yes    Tanning bed use? No    Are you easily burned? No    Excessive sun exposure? No    Blistering sunburns? 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     Social Drivers of Psychologist, prison and probation services Strain: Low Risk  (03/10/2023)    Overall Financial Resource Strain (CARDIA)     Difficulty of Paying Living Expenses: Not very hard   Food Insecurity: No Food Insecurity (03/10/2023)    Hunger Vital Sign     Worried About Running Out of Food in the Last Year: Never true     Ran Out of Food in the Last Year: Never true   Transportation Needs: No Transportation Needs (04/11/2023)    PRAPARE - Therapist, art (Medical): No     Lack of Transportation (Non-Medical): No         Allergies  Adhesive tape-silicones; Ferrlecit [sodium ferric gluconat-sucrose]; Levofloxacin; Methylnaltrexone; Neomycin; Papaya; Morphine; Zosyn [piperacillin-tazobactam]; Compazine [prochlorperazine]; Iron analogues; Reglan [metoclopramide hcl]; Iron dextran; and Latex, natural rubber    I was NOT able to review the Medication List with the patient or a representative. Further medication reconciliation is needed.  Prior to Admission medications    Medication Dose, Route, Frequency   acetaminophen (TYLENOL) 650 mg/20.3 mL Soln 1,000 mg, Enteral tube: post-pyloric (duodenum, jejunum), Every 8 hours   ammonium lactate (LAC-HYDRIN) 12 % lotion 1 Application, Topical,  2 times a day (standard)   apixaban (ELIQUIS) 5 mg Tab 5 mg, Oral, 2 times a day (standard)   baclofen (LIORESAL) 5 mg Tab tablet 5 mg, Oral, 3 times a day (standard)   buprenorphine (BUTRANS) 20 mcg/hour PTWK transdermal patch 1 patch, Transdermal, Weekly   escitalopram oxalate (LEXAPRO) 5 MG tablet 5 mg, Oral, Daily (standard)   fluticasone propionate (FLONASE) 50 mcg/actuation nasal spray 1 spray, Each Nare, Weekly   hydrocortisone 1 % cream Topical, 2 times a day PRN   LORazepam (ATIVAN) 1 MG tablet 1 mg, Oral, 2 times a day PRN   melatonin 3 mg Tab 3 mg, Oral, Nightly PRN   multivitamin with folic acid 400 mcg Tab tablet 1 tablet, Oral, Daily (standard)   naloxegol (MOVANTIK) 12.5 mg tablet 12.5 mg, Oral, Daily PRN   pantoprazole (PROTONIX) 40 mg injection 40 mg, Intravenous, 2 times a day (standard)   promethazine (PHENERGAN) 12.5 MG tablet 12.5 mg, Oral, Every 6 hours PRN   psyllium (METAMUCIL) 3.4 gram packet Dissolve as directed and take 1 packet by mouth Three (3) times a day.   scopolamine (TRANSDERM-SCOP) 1 mg over 3 days 1 patch, Transdermal, Every 72 hours   simethicone (MYLICON) 80 MG chewable tablet Chew 1 tablet (80 mg total) by mouth every six (6) hours as needed for flatulence (bloating, gas).   sucralfate (CARAFATE) 100 mg/mL suspension 1 g, Oral, 4 times a day   vancomycin (FIRVANQ) 50 mg/mL SolR oral solution 125 mg, Oral, Every 6 hours   zinc oxide-cod liver oil (DESITIN 40%) 40 % Pste Topical, Daily PRN       Designated Healthcare Decision Maker:  Ms. Fonte currently has decisional capacity for healthcare decision-making and is able to designate a surrogate healthcare decision maker. Ms. Hiltunen designated healthcare decision maker(s) is/are Carys Malina (the patient's parent) as denoted by stated patient preference.    Objective:   Physical Exam:  Temp:  [36.5 ??C (97.7 ??F)-36.7 ??C (98.1 ??F)] 36.5 ??C (97.7 ??F)  Heart Rate:  [77-87] 81  Resp:  [14-18] 18  BP: (100-137)/(52-69) 120/63  SpO2:  [97 %-99 %] 98 %    Gen: NAD, converses   Eyes: Sclera anicteric, EOMI grossly normal   HENT: Atraumatic, normocephalic, NG tube in place  Neck: Trachea midline  Heart: RRR  Lungs: CTAB, no crackles or wheezes  Abdomen: Soft, NTND  Extremities: No edema  Neuro: Grossly symmetric, non-focal    Skin:  No rashes, lesions on clothed exam  Psych: Alert, oriented

## 2023-05-03 DIAGNOSIS — D48119 Desmoid tumor: Principal | ICD-10-CM

## 2023-05-03 MED ADMIN — baclofen (LIORESAL) tablet 5 mg: 5 mg | ORAL | @ 19:00:00

## 2023-05-03 MED ADMIN — HYDROmorphone (PF) (DILAUDID) injection 1 mg: 1 mg | INTRAVENOUS | @ 10:00:00 | Stop: 2023-05-07

## 2023-05-03 MED ADMIN — HYDROmorphone (PF) (DILAUDID) injection Syrg 0.5 mg: .5 mg | INTRAVENOUS | Stop: 2023-05-03

## 2023-05-03 MED ADMIN — sodium chloride (NS) 0.9 % flush 10 mL: 10 mL | INTRAVENOUS | @ 14:00:00 | Stop: 2023-05-03

## 2023-05-03 MED ADMIN — vancomycin (FIRVANQ) (50 mg/mL) oral solution: 125 mg | ORAL | @ 14:00:00 | Stop: 2023-05-13

## 2023-05-03 MED ADMIN — sucralfate (CARAFATE) oral suspension: 1 g | ORAL | @ 21:00:00

## 2023-05-03 MED ADMIN — apixaban (ELIQUIS) tablet 5 mg: 5 mg | ORAL | @ 02:00:00

## 2023-05-03 MED ADMIN — acetaminophen (TYLENOL) oral liquid: 1000 mg | ENTERAL | @ 10:00:00

## 2023-05-03 MED ADMIN — vancomycin (FIRVANQ) (50 mg/mL) oral solution: 125 mg | ORAL | @ 19:00:00 | Stop: 2023-05-13

## 2023-05-03 MED ADMIN — acetaminophen (TYLENOL) oral liquid: 1000 mg | ENTERAL | @ 19:00:00

## 2023-05-03 MED ADMIN — oxyCODONE (ROXICODONE) 5 mg/5 mL solution 20 mg: 20 mg | ORAL | @ 12:00:00 | Stop: 2023-05-07

## 2023-05-03 MED ADMIN — LORazepam (ATIVAN) tablet 1 mg: 1 mg | ORAL | @ 03:00:00

## 2023-05-03 MED ADMIN — sucralfate (CARAFATE) oral suspension: 1 g | ORAL | @ 19:00:00

## 2023-05-03 MED ADMIN — oxyCODONE (ROXICODONE) 5 mg/5 mL solution 20 mg: 20 mg | ORAL | @ 06:00:00 | Stop: 2023-05-07

## 2023-05-03 MED ADMIN — vancomycin (FIRVANQ) (50 mg/mL) oral solution: 125 mg | ORAL | @ 08:00:00 | Stop: 2023-05-13

## 2023-05-03 MED ADMIN — baclofen (LIORESAL) tablet 5 mg: 5 mg | ORAL | @ 14:00:00

## 2023-05-03 MED ADMIN — lidocaine (ASPERCREME) 4 % 1 patch: 1 | TRANSDERMAL | @ 15:00:00

## 2023-05-03 MED ADMIN — famotidine (PEPCID) oral suspension: 20 mg | ENTERAL | @ 02:00:00

## 2023-05-03 MED ADMIN — ondansetron (ZOFRAN) injection 8 mg: 8 mg | INTRAVENOUS | @ 12:00:00

## 2023-05-03 MED ADMIN — apixaban (ELIQUIS) tablet 5 mg: 5 mg | ORAL | @ 14:00:00

## 2023-05-03 MED ADMIN — HYDROmorphone (PF) (DILAUDID) injection 1 mg: 1 mg | INTRAVENOUS | @ 19:00:00 | Stop: 2023-05-07

## 2023-05-03 MED ADMIN — vancomycin (FIRVANQ) (50 mg/mL) oral solution: 125 mg | ORAL | @ 02:00:00 | Stop: 2023-05-13

## 2023-05-03 MED ADMIN — HYDROmorphone (PF) (DILAUDID) injection 1 mg: 1 mg | INTRAVENOUS | @ 01:00:00 | Stop: 2023-05-05

## 2023-05-03 MED ADMIN — oxyCODONE (ROXICODONE) 5 mg/5 mL solution 20 mg: 20 mg | ORAL | @ 02:00:00 | Stop: 2023-05-05

## 2023-05-03 MED ADMIN — promethazine (PHENERGAN) tablet 12.5 mg: 12.5 mg | ORAL | @ 21:00:00

## 2023-05-03 MED ADMIN — sucralfate (CARAFATE) oral suspension: 1 g | ORAL | @ 10:00:00

## 2023-05-03 MED ADMIN — oxyCODONE (ROXICODONE) 5 mg/5 mL solution 20 mg: 20 mg | ORAL | @ 16:00:00 | Stop: 2023-05-07

## 2023-05-03 MED ADMIN — promethazine (PHENERGAN) tablet 12.5 mg: 12.5 mg | ORAL | @ 10:00:00

## 2023-05-03 MED ADMIN — oxyCODONE (ROXICODONE) 5 mg/5 mL solution 20 mg: 20 mg | ORAL | @ 21:00:00 | Stop: 2023-05-07

## 2023-05-03 MED ADMIN — escitalopram oxalate (LEXAPRO) tablet 5 mg: 5 mg | ENTERAL | @ 14:00:00

## 2023-05-03 MED ADMIN — pantoprazole (Protonix) injection 40 mg: 40 mg | INTRAVENOUS | @ 02:00:00

## 2023-05-03 MED ADMIN — ammonium lactate (LAC-HYDRIN) 12 % lotion 1 Application: 1 | TOPICAL | @ 14:00:00

## 2023-05-03 MED ADMIN — LORazepam (ATIVAN) tablet 1 mg: 1 mg | ORAL | @ 14:00:00

## 2023-05-03 MED ADMIN — promethazine (PHENERGAN) tablet 12.5 mg: 12.5 mg | ORAL | @ 03:00:00

## 2023-05-03 MED ADMIN — baclofen (LIORESAL) tablet 5 mg: 5 mg | ORAL | @ 02:00:00

## 2023-05-03 MED ADMIN — acetaminophen (TYLENOL) oral liquid: 1000 mg | ENTERAL | @ 03:00:00

## 2023-05-03 MED ADMIN — cefdinir (OMNICEF) oral suspension: 300 mg | ORAL | @ 14:00:00 | Stop: 2023-05-06

## 2023-05-03 MED ADMIN — sodium chloride (NS) 0.9 % flush 10 mL: 10 mL | INTRAVENOUS | @ 21:00:00 | Stop: 2023-05-03

## 2023-05-03 MED ADMIN — promethazine (PHENERGAN) tablet 12.5 mg: 12.5 mg | ORAL | @ 16:00:00

## 2023-05-03 MED ADMIN — ondansetron (ZOFRAN) injection 8 mg: 8 mg | INTRAVENOUS | @ 03:00:00

## 2023-05-03 MED ADMIN — pantoprazole (Protonix) injection 40 mg: 40 mg | INTRAVENOUS | @ 14:00:00

## 2023-05-03 NOTE — Unmapped (Signed)
 Massage Therapy Inpatient Consult Note    Patient: [name] [name]   Pronouns: she/her/hers    Date of birth: 03/30/2000    Service: Clinical Massage    Requesting Attending Physician :  Ansel Bong, MD  Service Requesting Consult : Oncology/Hematology (MDE)  Reason for Consult: relaxation    Visit Date: 05/03/2023    Precautions: Enteric , Enteric     Allergies:  Adhesive tape-silicones; Ferrlecit [sodium ferric gluconat-sucrose]; Levofloxacin; Methylnaltrexone; Neomycin; Papaya; Morphine; Zosyn [piperacillin-tazobactam]; Compazine [prochlorperazine]; Iron analogues; Reglan [metoclopramide hcl]; Iron dextran; and Latex, natural rubber    Code Status:  Full Code    ----------------------------------------------------------------    Visit Summary:  [name] [name] seemed to be doing much better than the last time I saw her right before she went to [facility] in [place]. While she is still struggling, she is now able to walk on her own! After our session we walked around the unit with PPE donned, her leg braces, a walker, and with me holding a chair as we went for her to be able to take breaks. She did amazingly well and walked all the way around the women's unit and back around B side.     I noticed again the flaccidness of both ankles, and tension in both legs. She was able to let me put her into dorsiflexion without pain. She didn't express feeling any pain from the session itself.   She continues to have a lot of stress and pain in general, but I am grateful to see her doing so much better.     Visitors came just as we were getting back to her room, along with her father and mother. I am happy to have been able to spend so much time with her today and I hope she is able to go home soon!     I usually check on patients weekly, but if I am able to come more often I will! Please feel free to send me a message on EPIC chat anytime they request a session! Thank you! ----------------------------------------------------------------    Treatment Goals: To assist with the management of pain and tension due to disease, treatment, and prolonged hospital stay.       Procedure Detail: Effleurage, Static holds/Light compression, Acupressure, Gentle rocking/vibration, and Assisted passive stretching    ----------------------------------------------------------------  I use a modified Walton Pressure Scale (WPS) in an effort to standardize documentation of this critical component of any touch therapy. My hope is to make the pressure I use clearly understandable to all team members involved in the care of each patient I am fortunate to work with.    Level 0: No pressure. Energy work or Management consultant. (No tissue is moved)   Level zero is not included in the WPS, I added it for my practice.    Level 1: Light lotioning (Only the skin is moved)  Level 2: Heavy lotioning (Similar to rubbing in sunscreen. Slight displacement of the superficial adipose layer and superficial muscles)  Level 3: Medium pressure (Medium layers of adipose, muscle, and blood vessels are moved, adjacent tissues move slightly)    Levels 4 and 5 are only used with healthy patients. I do not go beyond a level 3 in my practice.    Level 4: Strong pressure (Deep layers of adipose, muscle, blood vessels, and fascia are moved)  Level 5: (Significant compression of all tissues. Therapists bone affects patient's bone)  ----------------------------------------------------------------    Pressure Level: 0-1 and 0-2    Position:  Fowler    Areas of the body massaged: Neck, Shoulders, Lower extremities INcluding feet, and Upper extremities INcluding hands      Areas of the body avoided:       Lubricant Used: Hospital lotion with essential oil added: lemon & lavender    Music: Good conversation      Treatment Time: 2 hours    Total time spent on this patient's case today: 4+ hours    This is visit #: 15    ----------------------------------------------------------------  Lab Results   Component Value Date    PLT 220 05/01/2023       Current Oncology Treatment: Other    Site Restrictions : Interrupted lymphatic pathway, DVT (recent/current), Active infection, Tumor, Ostomy, IV's L arm, R hand:, and Other: NG Tube    Position Restrictions: As comfort allows    Personal Protective Equipment Used: Mask, Gown, and Gloves    ----------------------------------------------------------------    Before treatment: (None / Some / A Lot) or N/A           Pain: Some  Stress/Tension: Some  Swelling: N/A      After treatment: (Better / Same / Worse) or N/A    Pain: Better  Stress/Tension: Better  Swelling: N/A    ------------------------------------------------------------------    Surgical History:  Past Surgical History:   Procedure Laterality Date    COLON SURGERY      cyroablation      cystis removal      desmoid removal      PR CLOSE ENTEROSTOMY,RESEC+ANAST N/A 10/09/2020    Procedure: ILEOSTOMY TAKEDOWN;  Surgeon: Mickle Asper, MD;  Location: OR [place];  Service: General Surgery    PR COLONOSCOPY W/BIOPSY SINGLE/MULTIPLE N/A 10/27/2012    Procedure: COLONOSCOPY, FLEXIBLE, PROXIMAL TO SPLENIC FLEXURE; WITH BIOPSY, SINGLE OR MULTIPLE;  Surgeon: Shirlyn Goltz Mir, MD;  Location: PEDS PROCEDURE ROOM [place];  Service: Gastroenterology    PR COLONOSCOPY W/BIOPSY SINGLE/MULTIPLE N/A 09/14/2013    Procedure: COLONOSCOPY, FLEXIBLE, PROXIMAL TO SPLENIC FLEXURE; WITH BIOPSY, SINGLE OR MULTIPLE;  Surgeon: Shirlyn Goltz Mir, MD;  Location: PEDS PROCEDURE ROOM [place];  Service: Gastroenterology    PR COLONOSCOPY W/BIOPSY SINGLE/MULTIPLE N/A 11/08/2014    Procedure: COLONOSCOPY, FLEXIBLE, PROXIMAL TO SPLENIC FLEXURE; WITH BIOPSY, SINGLE OR MULTIPLE;  Surgeon: Arnold Long Mir, MD;  Location: PEDS PROCEDURE ROOM [facility];  Service: Gastroenterology    PR COLONOSCOPY W/BIOPSY SINGLE/MULTIPLE N/A 12/26/2015    Procedure: COLONOSCOPY, FLEXIBLE, PROXIMAL TO SPLENIC FLEXURE; WITH BIOPSY, SINGLE OR MULTIPLE;  Surgeon: Arnold Long Mir, MD;  Location: PEDS PROCEDURE ROOM [facility];  Service: Gastroenterology    PR COLONOSCOPY W/BIOPSY SINGLE/MULTIPLE N/A 09/02/2017    Procedure: COLONOSCOPY, FLEXIBLE, PROXIMAL TO SPLENIC FLEXURE; WITH BIOPSY, SINGLE OR MULTIPLE;  Surgeon: Arnold Long Mir, MD;  Location: PEDS PROCEDURE ROOM [place];  Service: Gastroenterology    PR COLSC FLX W/REMOVAL LESION BY HOT BX FORCEPS N/A 08/27/2016    Procedure: COLONOSCOPY, FLEXIBLE, PROXIMAL TO SPLENIC FLEXURE; W/REMOVAL TUMOR/POLYP/OTHER LESION, HOT BX FORCEP/CAUTE;  Surgeon: Arnold Long Mir, MD;  Location: PEDS PROCEDURE ROOM [facility];  Service: Gastroenterology    PR COLSC FLX W/RMVL OF TUMOR POLYP LESION SNARE TQ N/A 02/25/2019    Procedure: COLONOSCOPY FLEX; W/REMOV TUMOR/LES BY SNARE;  Surgeon: Helyn Numbers, MD;  Location: [facility] PROCEDURES [place] [facility];  Service: Gastroenterology    PR COLSC FLX W/RMVL OF TUMOR POLYP LESION SNARE TQ N/A 03/13/2020    Procedure: COLONOSCOPY FLEX; W/REMOV TUMOR/LES BY SNARE;  Surgeon: Helyn Numbers, MD;  Location:  [facility]EDURES [place] [facility];  Service: Gastroenterology    PR EXC SKIN BENIG 2.1-3 CM TRUNK,ARM,LEG Right 02/25/2017    Procedure: EXCISION, BENIGN LESION INCLUDE MARGINS, EXCEPT SKIN TAG, LEGS; EXCISED DIAMETER 2.1 TO 3.0 CM;  Surgeon: Clarene Duke, MD;  Location: CHILDRENS OR [facility];  Service: Plastics    PR EXC SKIN BENIG 3.1-4 CM TRUNK,ARM,LEG Right 02/25/2017    Procedure: EXCISION, BENIGN LESION INCLUDE MARGINS, EXCEPT SKIN TAG, ARMS; EXCISED DIAMETER 3.1 TO 4.0 CM;  Surgeon: Clarene Duke, MD;  Location: CHILDRENS OR [facility];  Service: Plastics    PR EXC SKIN BENIG >4 CM FACE,FACIAL Right 02/25/2017    Procedure: EXCISION, OTHER BENIGN LES INCLUD MARGIN, FACE/EARS/EYELIDS/NOSE/LIPS/MUCOUS MEMBRANE; EXCISED DIAM >4.0 CM;  Surgeon: Clarene Duke, MD;  Location: CHILDRENS OR [facility];  Service: Plastics    PR EXC TUMOR SOFT TISSUE LEG/ANKLE SUBQ 3+CM Right 08/05/2019    Procedure: EXCISION, TUMOR, SOFT TISSUE OF LEG OR ANKLE AREA, SUBCUTANEOUS; 3 CM OR GREATER;  Surgeon: Arsenio Katz, MD;  Location: MAIN OR [place];  Service: Plastics    PR EXC TUMOR SOFT TISSUE LEG/ANKLE SUBQ <3CM Right 08/05/2019    Procedure: EXCISION, TUMOR, SOFT TISSUE OF LEG OR ANKLE AREA, SUBCUTANEOUS; LESS THAN 3 CM;  Surgeon: Arsenio Katz, MD;  Location: MAIN OR [facility];  Service: Plastics    PR LAP, SURG PROCTECTOMY W J-POUCH N/A 08/10/2020    Procedure: ROBOTIC ASSISTED LAPAROSCOPIC PROCTOCOLECTOMY, ILEAL J POUCH, WITH OSTOMY;  Surgeon: Mickle Asper, MD;  Location: OR [place];  Service: General Surgery    PR NDSC EVAL INTSTINAL POUCH DX W/COLLJ SPEC SPX N/A 01/23/2021    Procedure: ENDO EVAL SM INTEST POUCH; DX;  Surgeon: Modena Nunnery, MD;  [facility]: [facility]ED[facility] [place] [facility];  Service: Gastroenterology    PR NDSC EVAL INTSTINAL POUCH DX W/COLLJ SPEC SPX N/A 08/27/2021    Procedure: ENDO EVAL SM INTEST POUCH; DX;  Surgeon: Hunt Oris, MD;  [facility]: [facility]URES [facility];  Service: Gastroenterology    PR NDSC EVAL INTSTINAL POUCH DX W/COLLJ SPEC SPX N/A 12/09/2021    Procedure: ENDO EVAL SM INTEST POUCH; DX;  Surgeon: Vidal Schwalbe, MD;  Location: GI PROCEDURES [facility];  Service: Gastroenterology    PR NDSC EVAL INTSTINAL POUCH DX W/COLLJ [facility] SPX Left 04/09/2022    Procedure: ENDO EVAL SM INTEST POUCH; DX;  Surgeon: Modena Nunnery, MD;  Location: [facility]URES [place] [facility];  Service: Gastroenterology    PR NDSC EVAL INTSTINAL POUCH DX W/COLLJ SPEC SPX N/A 08/05/2022    Procedure: ENDO EVAL SM INTEST POUCH; DX;  Surgeon: Modena Nunnery, MD;  Location: GI PROCEDURES [facility];  Service: Gastroenterology    PR NDSC EVAL INTSTINAL POUCH DX W/COLLJ SPEC SPX N/A 03/13/2023    Procedure: ENDO EVAL SM INTEST POUCH; DX;  Surgeon: Carmon Ginsberg, MD;  Location: GI PROCEDURES [facility];  Service: Gastroenterology    PR NDSC EVAL INTSTINAL POUCH W/BX SINGLE/MULTIPLE N/A 01/20/2022    Procedure: ENDOSCOPIC EVAL OF SMALL INTESTINAL POUCH; DIAGNOSTIC, No biopsies;  Surgeon: Andrey Farmer, MD;  Location: GI PROCEDURES [facility];  Service: Gastroenterology    PR NDSC EVAL INTSTINAL POUCH W/BX SINGLE/MULTIPLE N/A 02/13/2022    Procedure: ENDOSCOPIC EVAL OF SMALL INTESTINAL POUCH; DIAGNOSTIC, WITH BIOPSY;  Surgeon: Bronson Curb, MD;  Location: GI PROCEDURES [facility];  Service: Gastroenterology    PR NDSC EVAL INTSTINAL POUCH W/BX SINGLE/MULTIPLE N/A 03/13/2023    Procedure: ENDOSCOPIC EVAL OF  SMALL INTESTINAL POUCH; DIAGNOSTIC, WITH BIOPSY;  Surgeon: Fara Boros Michelene Heady, MD;  Location: GI PROCEDURES [facility];  Service: Gastroenterology    PR UNLISTED PROCEDURE SMALL INTESTINE  01/23/2021    Procedure: UNLISTED PROCEDURE, SMALL INTESTINE;  Surgeon: Modena Nunnery, MD;  [facility]: [facility] [place] [facility];  Service: Gastroenterology    PR UNLISTED PROCEDURE SMALL INTESTINE  02/13/2022    Procedure: UNLISTED PROCEDURE, SMALL INTESTINE;  Surgeon: Bronson Curb, MD;  Location: GI PROCEDURES [facility];  Service: Gastroenterology    PR UPPER GI ENDOSCOPY,BIOPSY N/A 10/27/2012    Procedure: UGI ENDOSCOPY; WITH BIOPSY, SINGLE OR MULTIPLE;  Surgeon: Shirlyn Goltz Mir, MD;  Location: PEDS PROCEDURE ROOM [facility];  Service: Gastroenterology    PR UPPER GI ENDOSCOPY,BIOPSY N/A 09/14/2013    Procedure: UGI ENDOSCOPY; WITH BIOPSY, SINGLE OR MULTIPLE;  Surgeon: Shirlyn Goltz Mir, MD;  Location: PEDS PROCEDURE ROOM [facility];  Service: Gastroenterology    PR UPPER GI ENDOSCOPY,BIOPSY N/A 11/08/2014    Procedure: UGI ENDOSCOPY; WITH BIOPSY, SINGLE OR MULTIPLE;  Surgeon: Arnold Long Mir, MD;  Location: PEDS PROCEDURE ROOM [facility];  Service: Gastroenterology    PR UPPER GI ENDOSCOPY,BIOPSY N/A 12/26/2015    Procedure: UGI ENDOSCOPY; WITH BIOPSY, SINGLE OR MULTIPLE;  Surgeon: Arnold Long Mir, MD;  Location: PEDS PROCEDURE ROOM [facility];  Service: Gastroenterology    PR UPPER GI ENDOSCOPY,BIOPSY N/A 08/27/2016    Procedure: UGI ENDOSCOPY; WITH BIOPSY, SINGLE OR MULTIPLE;  Surgeon: Arnold Long Mir, MD;  Location: PEDS PROCEDURE ROOM [facility];  Service: Gastroenterology    PR UPPER GI ENDOSCOPY,BIOPSY N/A 09/02/2017    Procedure: UGI ENDOSCOPY; WITH BIOPSY, SINGLE OR MULTIPLE;  Surgeon: Arnold Long Mir, MD;  Location: PEDS PROCEDURE ROOM [facility];  Service: Gastroenterology    PR UPPER GI ENDOSCOPY,BIOPSY N/A 03/13/2020    Procedure: UGI ENDOSCOPY; WITH BIOPSY, SINGLE OR MULTIPLE;  Surgeon: Helyn Numbers, MD;  Location: [facility] PROCEDURES [place] [facility];  Service: Gastroenterology    PR UPPER GI ENDOSCOPY,BIOPSY N/A 09/05/2021    Procedure: UGI ENDOSCOPY; WITH BIOPSY, SINGLE OR MULTIPLE;  Surgeon: Wendall Papa, MD;  Location: GI PROCEDURES [facility];  Service: Gastroenterology    PR UPPER GI ENDOSCOPY,DIAGNOSIS N/A 01/20/2022    Procedure: UGI ENDO, INCLUDE ESOPHAGUS, STOMACH, & DUODENUM &/OR JEJUNUM; DX W/WO COLLECTION SPECIMN, BY BRUSH OR WASH;  Surgeon: Andrey Farmer, MD;  Location: GI PROCEDURES [facility];  Service: Gastroenterology    TUMOR REMOVAL      multiple-head, neck, back, hand, right flank, multiple       ----------------------------------------------------------------    Plan/Follow-Up:    A recommendation was made for Clinical Massage at least 1x per every 2 weeks      Patient resting in room post-session, call light/all needs within reach, fall precautions in place.        I am deeply honored to be a part of this patient's care. Thank you so very much for the opportunity to work with them!  Please reach out with any questions or concerns.   .................................................................................................................................................................................................................  There is no need to put in a new consult at this admission.   If this patient requests massage during this admission, you can send me a message on EPIC chat, or call me on vocera.   ..................................................................................................................................................................................................................  Please feel free to put in a new consult order for massage therapy at any subsequent admission. Thank you so much!   ................................................................................................................................................................................................Marland Kitchen

## 2023-05-03 NOTE — Unmapped (Signed)
 Patient with ongoing abdominal pain. PRN given; see MAR. Tube feeds continued. Mom at bedside.     Problem: Adult Inpatient Plan of Care  Goal: Plan of Care Review  Outcome: Ongoing - Unchanged  Goal: Patient-Specific Goal (Individualized)  Outcome: Ongoing - Unchanged  Goal: Absence of Hospital-Acquired Illness or Injury  Outcome: Ongoing - Unchanged  Intervention: Identify and Manage Fall Risk  Recent Flowsheet Documentation  Taken 05/02/2023 2000 by Duane Boston, RN  Safety Interventions:   fall reduction program maintained   lighting adjusted for tasks/safety   low bed   nonskid shoes/slippers when out of bed   room near unit station   family at bedside   isolation precautions  Intervention: Prevent Skin Injury  Recent Flowsheet Documentation  Taken 05/03/2023 0210 by Duane Boston, RN  Positioning for Skin: Supine/Back  Taken 05/03/2023 0050 by Duane Boston, RN  Positioning for Skin: Supine/Back  Taken 05/02/2023 2250 by Duane Boston, RN  Positioning for Skin: Supine/Back  Taken 05/02/2023 2000 by Duane Boston, RN  Positioning for Skin: Supine/Back  Intervention: Prevent Infection  Recent Flowsheet Documentation  Taken 05/02/2023 2000 by Duane Boston, RN  Infection Prevention: cohorting utilized  Goal: Optimal Comfort and Wellbeing  Outcome: Ongoing - Unchanged  Goal: Readiness for Transition of Care  Outcome: Ongoing - Unchanged  Goal: Rounds/Family Conference  Outcome: Ongoing - Unchanged

## 2023-05-03 NOTE — Unmapped (Signed)
 Pt alert and oriented x4, VSS afebrile; breathing even and unlabored on RA, lungs B CTA; Korpak in place, all meds given via Korpak or iv; pt ambulated with staff, no falls this shift; voiding and cont BM; c/o pain and nausea, prns given with adequate relief noted; bed in lowest position, call bell within reach

## 2023-05-03 NOTE — Unmapped (Signed)
 Oncology (MEDO) Progress Note    Assessment & Plan:   [name] [name] is a 23 y.o. female whose presentation is complicated by [name] c/b desmoid fibromatosis, recurrent ileus, GI bleeds, DVT, MDD, and GAD that presented to [facility] with C diff infection.     Principal Problem:    C. difficile diarrhea  Active Problems:    [name]    Acute deep vein thrombosis (DVT) of axillary vein of right upper extremity (CMS-HCC)    Severe protein-calorie malnutrition (CMS-HCC)    Lower abdominal pain    Generalized anxiety disorder with panic attacks    MDD (major depressive disorder)        Active Problems    C Difficile Infection  Patient diagnosed with C diff via stool sample collected on 2/19. Started on PO Vancomycin on that date with plans for 14 day course. Since starting the Vancomycin, diarrhea has started to improve.  -Continue PO Vancomycin, plan for 14d course (2/19-3/5)     Acute on chronic abdominal pain and nausea  Complex situation discussed at length in prior notes. In brief, her pain and nausea are (deeply) multifactorial but consensus among treatment team is that new / unknown organic issue is unlikely. Plan to focus on symptom control, adequate nutrition.  Patient is currently at goal tube feeding, tolerating well.  No visible emesis noted.  Still reports persistent nausea but on aggressive nausea management as below. Will need to consult chronic pain service here at [facility] on monday AM when [name] is attending in service.   -NO CHANGES to pain regimen without multiparty discussion including patient, unless true emergency.  -Pain management (continued from [facility]): Tylenol 1g q8h scheduled, Buprenorphine patch 31mcg/hr weekly, Oxycodone 20 mg q4h PRN, Dilaudid 1 mg q6h PRN 2nd line  -Spasticity (continued from [facility]): Baclofen 5mg  TID (started in rehab). We discussed valium but never used this for spasticity while inpatient as she has a contract with outpatient pain provider  -Nausea (continued from [facility]): Continue IV zofran PRN, PO Phenergan q6h scheduled. Used IV Phenergan at [facility], but was transitioned to oral 2/17. Also used scopolamine. She refused the zyprexa nightly.   -Dyspepsia (continued from [facility]): Pepcid BID, Protonix BID  -GI motility (continued from [facility]): used simethicone TID PRN and naloxegol PRN  -Chronic pain consult on 2/23 AM  -GI consult in AM     DVT of LUE  Had LUE DVT diagnosed via PVL on 04/06/2023 associated with PICC line. Has been on Eliquis since that time.   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 [name]. Patient has numerous sites of disease but most bothersome has been mesenteric sites which have caused chronic abdominal pain, nausea/vomiting. Please see Dr. Brynda Rim note from 1/14 addended 1/15 with his very thoughtful assessment of this situation and current hospitalization. [facility] had continued home nirogacestat as tolerated. Patient tried holding it for a few days to see if it helped abdominal symptoms. okay to hold nirogesat while having difficulty with oral meds, no urgency to resume per Dr [name] .     Chronic Problems     GAD - MDD  -Continue Lexapro      Issues Impacting Complexity of Management:  -Discussion of risks/benefits of Acute on chronic abdominal pain, desmoid fibromatosis with the patient/family including risk of risk factor: infection, medical complications, and urinary retention.  -  Parenteral controlled medications: IV Dilaudid    Medical Decision Making: Reviewed records from the following unique sources  Admission notes, and speciality notes.      Daily Checklist:  Diet: Tube Feeds  DVT PPx: Patient Already on Full Anticoagulation with Eliquis  Electrolytes: Replete Potassium to >/=4 and Magnesium to >/=2  Code Status: Full Code  Dispo:  pending return to baseline stool function and pain    Team Contact Information:   Primary Team: Oncology (MEDO)  Primary Resident: Gracelyn Nurse, MD, MD  Resident's Pager: [phone] (Oncology Senior Resident)    Interval History:   No acute events overnight.  Patient overall says pain has been improved since yesterday afternoon.  States pain is around a 5 currently with yesterday being a brown ache.  Believes pain was low but worse when she is having more bowel movements and could feel that her tumor was pressing against her bowels moving.  Denies any new complaints or concerns.    ROS: Denies headache, chest pain, shortness of breath, abdominal pain, nausea, vomiting.    Objective:   Temp:  [36.6 ??C (97.9 ??F)-36.9 ??C (98.4 ??F)] 36.9 ??C (98.4 ??F)  Heart Rate:  [75-88] 83  Resp:  [16-18] 18  BP: (110-123)/(59-69) 123/69  SpO2:  [96 %-99 %] 96 %,   Intake/Output Summary (Last 24 hours) at 05/03/2023 1356  Last data filed at 05/02/2023 1552  Gross per 24 hour   Intake 10 ml   Output --   Net 10 ml   , No intake/output data recorded.,  ,  , Body mass index is 20.51 kg/m??.,   Wt Readings from Last 3 Encounters:   05/03/23 55.9 kg (123 lb 3.8 oz)   04/06/23 54.4 kg (120 lb)   03/24/23 52.8 kg (116 lb 6.4 oz)       Gen: Chronically ill female laying in bed with NG tube in place  HENT: atraumatic, normocephalic  Heart: RRR  Lungs: CTAB, no crackles or wheezes  Abdomen: soft, NTND  Extremities: No edema

## 2023-05-04 LAB — BASIC METABOLIC PANEL
ANION GAP: 12 mmol/L (ref 5–14)
BLOOD UREA NITROGEN: 10 mg/dL (ref 9–23)
BUN / CREAT RATIO: 20
CALCIUM: 9.8 mg/dL (ref 8.7–10.4)
CHLORIDE: 102 mmol/L (ref 98–107)
CO2: 27 mmol/L (ref 20.0–31.0)
CREATININE: 0.51 mg/dL — ABNORMAL LOW (ref 0.55–1.02)
EGFR CKD-EPI (2021) FEMALE: >90 mL/min/{1.73_m2} (ref >=60)
GLUCOSE RANDOM: 147 mg/dL (ref 70–179)
POTASSIUM: 3.9 mmol/L (ref 3.4–4.8)
SODIUM: 141 mmol/L (ref 135–145)

## 2023-05-04 LAB — CBC
HEMATOCRIT: 35.5 % (ref 34.0–44.0)
HEMOGLOBIN: 12 g/dL (ref 11.3–14.9)
MEAN CORPUSCULAR HEMOGLOBIN CONC: 33.9 g/dL (ref 32.0–36.0)
MEAN CORPUSCULAR HEMOGLOBIN: 29.1 pg (ref 25.9–32.4)
MEAN CORPUSCULAR VOLUME: 85.9 fL (ref 77.6–95.7)
MEAN PLATELET VOLUME: 10.1 fL (ref 6.8–10.7)
PLATELET COUNT: 208 10*9/L (ref 150–450)
RED BLOOD CELL COUNT: 4.13 10*12/L (ref 3.95–5.13)
RED CELL DISTRIBUTION WIDTH: 17 % — ABNORMAL HIGH (ref 12.2–15.2)
WBC ADJUSTED: 8.3 10*9/L (ref 3.6–11.2)

## 2023-05-04 LAB — MAGNESIUM: MAGNESIUM: 2 mg/dL (ref 1.6–2.6)

## 2023-05-04 MED ADMIN — sucralfate (CARAFATE) oral suspension: 1 g | ORAL | @ 03:00:00

## 2023-05-04 MED ADMIN — sodium bicarbonate tablet 650 mg: 650 mg | GASTROENTERAL | @ 23:00:00 | Stop: 2023-05-04

## 2023-05-04 MED ADMIN — sucralfate (CARAFATE) oral suspension: 1 g | ORAL | @ 19:00:00

## 2023-05-04 MED ADMIN — scopolamine (TRANSDERM-SCOP) 1 mg over 3 days topical patch 1 mg: 1 | TOPICAL | @ 14:00:00

## 2023-05-04 MED ADMIN — baclofen (LIORESAL) tablet 5 mg: 5 mg | ORAL | @ 19:00:00

## 2023-05-04 MED ADMIN — promethazine (PHENERGAN) tablet 12.5 mg: 12.5 mg | ORAL | @ 02:00:00

## 2023-05-04 MED ADMIN — vancomycin (FIRVANQ) (50 mg/mL) oral solution: 125 mg | ORAL | @ 14:00:00 | Stop: 2023-05-13

## 2023-05-04 MED ADMIN — apixaban (ELIQUIS) tablet 5 mg: 5 mg | ORAL | @ 14:00:00

## 2023-05-04 MED ADMIN — acetaminophen (TYLENOL) oral liquid: 1000 mg | ENTERAL | @ 03:00:00

## 2023-05-04 MED ADMIN — oxyCODONE (ROXICODONE) 5 mg/5 mL solution 20 mg: 20 mg | ORAL | @ 03:00:00 | Stop: 2023-05-07

## 2023-05-04 MED ADMIN — escitalopram oxalate (LEXAPRO) tablet 5 mg: 5 mg | ENTERAL | @ 14:00:00

## 2023-05-04 MED ADMIN — promethazine (PHENERGAN) tablet 12.5 mg: 12.5 mg | ORAL | @ 21:00:00

## 2023-05-04 MED ADMIN — sucralfate (CARAFATE) oral suspension: 1 g | ORAL | @ 11:00:00

## 2023-05-04 MED ADMIN — ammonium lactate (LAC-HYDRIN) 12 % lotion 1 Application: 1 | TOPICAL | @ 14:00:00

## 2023-05-04 MED ADMIN — lidocaine (ASPERCREME) 4 % 1 patch: 1 | TRANSDERMAL | @ 16:00:00

## 2023-05-04 MED ADMIN — famotidine (PEPCID) oral suspension: 20 mg | ENTERAL | @ 03:00:00

## 2023-05-04 MED ADMIN — promethazine (PHENERGAN) tablet 12.5 mg: 12.5 mg | ORAL | @ 08:00:00

## 2023-05-04 MED ADMIN — baclofen (LIORESAL) tablet 5 mg: 5 mg | ORAL | @ 14:00:00

## 2023-05-04 MED ADMIN — ammonium lactate (LAC-HYDRIN) 12 % lotion 1 Application: 1 | TOPICAL | @ 03:00:00

## 2023-05-04 MED ADMIN — HYDROmorphone (PF) (DILAUDID) injection 1 mg: 1 mg | INTRAVENOUS | @ 09:00:00 | Stop: 2023-05-04

## 2023-05-04 MED ADMIN — oxyCODONE (ROXICODONE) 5 mg/5 mL solution 20 mg: 20 mg | ORAL | @ 23:00:00 | Stop: 2023-05-07

## 2023-05-04 MED ADMIN — acetaminophen (TYLENOL) oral liquid: 1000 mg | ENTERAL | @ 11:00:00

## 2023-05-04 MED ADMIN — cetirizine (ZYRTEC) oral syrup: 20 mg | ENTERAL | @ 11:00:00

## 2023-05-04 MED ADMIN — vancomycin (FIRVANQ) (50 mg/mL) oral solution: 125 mg | ORAL | @ 03:00:00 | Stop: 2023-05-13

## 2023-05-04 MED ADMIN — cefdinir (OMNICEF) oral suspension: 300 mg | ORAL | @ 14:00:00 | Stop: 2023-05-06

## 2023-05-04 MED ADMIN — acetaminophen (TYLENOL) oral liquid: 1000 mg | ENTERAL | @ 19:00:00

## 2023-05-04 MED ADMIN — vancomycin (FIRVANQ) (50 mg/mL) oral solution: 125 mg | ORAL | @ 19:00:00 | Stop: 2023-05-13

## 2023-05-04 MED ADMIN — oxyCODONE (ROXICODONE) 5 mg/5 mL solution 20 mg: 20 mg | ORAL | @ 19:00:00 | Stop: 2023-05-07

## 2023-05-04 MED ADMIN — oxyCODONE (ROXICODONE) 5 mg/5 mL solution 20 mg: 20 mg | ORAL | @ 13:00:00 | Stop: 2023-05-07

## 2023-05-04 MED ADMIN — ondansetron (ZOFRAN) injection 8 mg: 8 mg | INTRAVENOUS | @ 09:00:00

## 2023-05-04 MED ADMIN — ondansetron (ZOFRAN) injection 8 mg: 8 mg | INTRAVENOUS | @ 17:00:00

## 2023-05-04 MED ADMIN — oxyCODONE (ROXICODONE) 5 mg/5 mL solution 20 mg: 20 mg | ORAL | @ 08:00:00 | Stop: 2023-05-07

## 2023-05-04 MED ADMIN — diphenhydrAMINE (BENADRYL) 25 mg in sodium chloride (NS) 0.9 % 100.5 mL infusion: 50 mL/h | INTRAVENOUS | @ 20:00:00 | Stop: 2023-05-04

## 2023-05-04 MED ADMIN — diphenhydrAMINE (BENADRYL) injection: 25 mg | INTRAVENOUS | @ 03:00:00 | Stop: 2023-05-03

## 2023-05-04 MED ADMIN — apixaban (ELIQUIS) tablet 5 mg: 5 mg | ORAL | @ 02:00:00

## 2023-05-04 MED ADMIN — ondansetron (ZOFRAN) injection 8 mg: 8 mg | INTRAVENOUS | @ 01:00:00

## 2023-05-04 MED ADMIN — promethazine (PHENERGAN) tablet 12.5 mg: 12.5 mg | ORAL | @ 14:00:00

## 2023-05-04 MED ADMIN — pancrelipase (Lip-Prot-Amyl) (CREON) 24,000-76,000 -120,000 unit delayed release capsule 24,000 units of lipase: 1 | GASTROENTERAL | @ 23:00:00 | Stop: 2023-05-04

## 2023-05-04 MED ADMIN — pantoprazole (Protonix) injection 40 mg: 40 mg | INTRAVENOUS | @ 02:00:00

## 2023-05-04 MED ADMIN — pantoprazole (Protonix) injection 40 mg: 40 mg | INTRAVENOUS | @ 14:00:00

## 2023-05-04 MED ADMIN — vancomycin (FIRVANQ) (50 mg/mL) oral solution: 125 mg | ORAL | @ 08:00:00 | Stop: 2023-05-13

## 2023-05-04 MED ADMIN — baclofen (LIORESAL) tablet 5 mg: 5 mg | ORAL | @ 02:00:00

## 2023-05-04 MED ADMIN — HYDROmorphone (PF) (DILAUDID) injection 1 mg: 1 mg | INTRAVENOUS | @ 17:00:00 | Stop: 2023-05-04

## 2023-05-04 MED ADMIN — HYDROmorphone (PF) (DILAUDID) injection 1 mg: 1 mg | INTRAVENOUS | @ 01:00:00 | Stop: 2023-05-07

## 2023-05-04 MED ADMIN — HYDROmorphone (PF) (DILAUDID) injection 1 mg: 1 mg | INTRAVENOUS | @ 21:00:00 | Stop: 2023-05-07

## 2023-05-04 NOTE — Unmapped (Signed)
 Oncology (MEDO) Progress Note    Assessment & Plan:   [name] [name] is a 23 y.o. female whose presentation is complicated by [name] c/b desmoid fibromatosis, recurrent ileus, GI bleeds, DVT, MDD, and GAD that presented to [facility] with C diff infection.     Principal Problem:    C. difficile diarrhea  Active Problems:    [name]    Acute deep vein thrombosis (DVT) of axillary vein of right upper extremity (CMS-HCC)    Severe protein-calorie malnutrition (CMS-HCC)    Lower abdominal pain    Generalized anxiety disorder with panic attacks    MDD (major depressive disorder)    Active Problems    C Difficile Infection  Patient diagnosed with C diff via stool sample collected on 2/19. Started on PO Vancomycin on that date with plans for 14 day course. Since starting the Vancomycin, diarrhea has started to improve.  -Continue PO Vancomycin, plan for 14d course (2/19-3/5)    Morbiliform rash  Likely 2/2 PO vancomycin.Refuses PO benadryl and PO atarax. Thus, we are compounding IV benadryl into a bug of IVF to infuse in order to prevent other unwanted side effects.      Acute on chronic abdominal pain and nausea  Complex situation discussed at length in prior notes. In brief, her pain and nausea are (deeply) multifactorial but consensus among treatment team is that new / unknown organic issue is unlikely. Plan to focus on symptom control, adequate nutrition.  Patient is currently at goal tube feeding, tolerating well.  No visible emesis noted.  Still reports persistent nausea but on aggressive nausea management as below. Will need to consult chronic pain service here at [facility] on monday AM when [name] is attending in service.   -NO CHANGES to pain regimen without multiparty discussion including patient, unless true emergency.  -Pain management (continued from [facility]): Tylenol 1g q8h scheduled, Buprenorphine patch 8mcg/hr weekly, Oxycodone 20 mg q4h PRN, Dilaudid 1 mg q4h PRN (don't give dilaudid and oxy within 1 hour of each other)  -Spasticity (continued from [facility]): Baclofen 5mg  TID (started in rehab). We discussed valium but never used this for spasticity while inpatient as she has a contract with outpatient pain provider  -Nausea (continued from [facility]): Continue IV zofran PRN, PO Phenergan q6h scheduled. Used IV Phenergan at [facility], but was transitioned to oral 2/17. Also used scopolamine. She refused the zyprexa nightly.   -Dyspepsia (continued from [facility]): Pepcid BID, Protonix BID  -GI motility (continued from [facility]): used simethicone TID PRN and naloxegol PRN  -Chronic pain consult on 2/23 AM  -GI consult in AM     DVT of LUE  Had LUE DVT diagnosed via PVL on 04/06/2023 associated with PICC line. Has been on Eliquis since that time.   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 [name]. Patient has numerous sites of disease but most bothersome has been mesenteric sites which have caused chronic abdominal pain, nausea/vomiting. Please see Dr. Brynda Rim note from 1/14 addended 1/15 with his very thoughtful assessment of this situation and current hospitalization. AIR had continued home nirogacestat as tolerated. Patient tried holding it for a few days to see if it helped abdominal symptoms. okay to hold nirogesat while having difficulty with oral meds, no urgency to resume per Dr [name] .     Chronic Problems  GAD - MDD  -Continue Lexapro    Issues Impacting Complexity of Management:  -Discussion of risks/benefits of Acute on chronic abdominal pain, desmoid fibromatosis with the patient/family including risk of risk factor: infection, medical complications, and urinary retention.  -Parenteral controlled medications: IV Dilaudid    Medical Decision Making: Reviewed records from the following unique sources  Admission notes, and speciality notes.    Daily Checklist:  Diet: Tube Feeds  DVT PPx: Patient Already on Full Anticoagulation with Eliquis  Electrolytes: Replete Potassium to >/=4 and Magnesium to >/=2  Code Status: Full Code  Dispo:  pending return to baseline stool function and pain    Team Contact Information:   Primary Team: Oncology (MEDO)  Primary Resident: Oneal Grout, MD, MD  Resident's Pager: [phone] (Oncology Senior Resident)    Interval History:   No acute events overnight.  Pt had a nonspecific rash / itching which she believes may be 2/2 PO vancomycin and got IV benadryl. Requesting more doses but no rash apparent on my AM exam. Refuses PO benadryl and PO atarax. Thus, we are compounding IV benadryl into a bug of IVF to infuse in order to prevent other unwanted side effects.     Reports that diarrhea frequency is the same has prior but stools are more loose. Also states that she has more pain and is requesting all PRNs.    Objective:   Temp:  [36.6 ??C (97.9 ??F)-37.1 ??C (98.8 ??F)] 36.6 ??C (97.9 ??F)  Heart Rate:  [72-94] 83  Resp:  [16-18] 16  BP: (104-130)/(56-80) 104/80  SpO2:  [96 %-99 %] 98 %,   Intake/Output Summary (Last 24 hours) at 05/04/2023 0758  Last data filed at 05/04/2023 0536  Gross per 24 hour   Intake 330 ml   Output --   Net 330 ml   , No intake/output data recorded.,  ,  , Body mass index is 20.51 kg/m??.,   Wt Readings from Last 3 Encounters:   05/03/23 55.9 kg (123 lb 3.8 oz)   04/06/23 54.4 kg (120 lb)   03/24/23 52.8 kg (116 lb 6.4 oz)     Gen: Chronically ill female laying in bed with NG tube in place  HENT: atraumatic, normocephalic  Heart: RRR  Lungs: CTAB, no crackles or wheezes  Abdomen: soft, NTND  Extremities: No edema

## 2023-05-04 NOTE — Unmapped (Signed)
 VENOUS ACCESS TEAM PROCEDURE    Nurse request was placed for a PIV by Venous Access Team (VAT).  Patient was assessed at bedside for placement of a PIV. PPE were donned per protocol.  Access was obtained. Blood return noted.  Dressing intact and device well secured.  Flushed with normal saline.  See LDA for details.  Pt advised to inform RN of any s/s of discomfort at the PIV site.    Workup / Procedure Time:  15 minutes       Care RN was notified.       Thank you,     Olene Godfrey Jean Rosenthal, RN Venous Access Team

## 2023-05-04 NOTE — Unmapped (Signed)
 Physical Medicine and Rehab  Consult Note    Requesting Attending Physician: Ansel Bong, MD  Service Requesting Consult: Oncology/Hematology (MDE)    ASSESSMENT / RECOMMENDATIONS:     [name] [name] is a 23 y.o. female with past medical history of [name] c/b desmoid fibromatosis, recurrent ileus, GI bleeds, DVT, MDD, and GAD, admitted to [facility] on 2/22 with C diff .  The patient is seen in consultation for evaluation of rehabilitation needs and pain.    Functional Impairment  Debility secondary to desmoid fibromatosis with abdominal discomfort, hx of bilateral foot drop  - Please continue to have patient work with PT and OT to maximize functional status with mobility and ADLs.  - Encouraged patient to do exercises in bed a few times an hour and get out of bed to chair / walk with staff and mother to avoid deconditioning.   - continue to use AFOs for walking. Encourage standing to avoid plantar flexion contractures.      Rehabilitation Plan of Care  Patient recently had a 3 week AIR stay. Anticipate if she can maintain her strength in the acute hospital, she may be able to return with home health therapy when medically improved.   - Please re consult [name] if she is unable to progress towards home.     - case discussed with acute team.     *This PM&R consult does not guarantee that patient has been accepted to [facility] AIR, but can be helpful to guide patient progression.*  In order for the patient to be considered for admission to [facility] inpatient rehab, the case manager must give the patient / family choice in post-acute discharge options AND if the patient desires [facility] place a referral order to [facility] inpatient rehab. A referral for [facility] inpatient rehab has not been received. The case manager may contact the Rehab Intake Coordinator with questions about acceptance to [facility] AIR,  bed availability and insurance authorization.     Darrick Grinder, MD    Thank you for this consult.  Please contact the PM&R consult pager [phone]) for questions regarding these recommendations.  For questions of bed availability at [facility], contact the Intake Office at [phone].      SUBJECTIVE:     Reason for Consult: Patient seen in consultation at the request of Ansel Bong, MD for evaluation of rehabilitation needs and recommendations.  Chief Complaint: pain  History of Present Illness: [name] [name] is a 23 y.o. female with a past medical history of  [name] c/b desmoid fibromatosis, recurrent ileus, GI bleeds, DVT, MDD, and GAD, admitted to [facility] on 2/22 with C diff infection. She was recently discharge from AIR 2/21 at a modified independent level.     Today patient is doing fair. She is having abdominal pain controlled with meds. Weakness in legs about the same. She is getting up with therapy and with family / nursing    Prior Functional Status:      Prior Functional Status: Prior admission, pt was a limited community ambulator, using a custom wheelchair to navigate community setting and using a rollator for household mobility. She required1 person assistance from her mother for stair negotiation. She was recently rehospitalized and DC'd to AIR where she was ambulating w/ some assistance per pt and mother.    Current Functional Status: Therapy notes reviewed and analyzed     Activities of Daily Living:   Assessment  Problem List: Decreased strength, Decreased activity tolerance, Decreased endurance, Decreased mobility, Impaired  ADLs, Impaired balance, Decreased range of motion, Impaired fine motor skills, Fall risk  Personal Factors/Comorbidities (Occupational Profile and History Review): Expanded (Moderate)  Clinical Decision Making: Moderate Complexity  Assessment: [name] [name] is a 23 y.o. female whose presentation is complicated by [name] c/b desmoid fibromatosis, recurrent ileus, GI bleeds, DVT, MDD, and GAD that presented to [facility] with C diff infection. Per Epic    Patient seen for initial OT evaluation and occupational profile. With consideration of patient's occupational profile, assessment review, level of clinical decision making involved, and intervention plan, patient presents as a mod complexity case w/ the following functional deficits: impaired dynamic balance , decreased UE strength , decreased LE strength , impaired UE ROM, and increased fall risk  that impact independent participation in ADLs. Pt reports recent need for assistance during ADL/IADLs, stair navigation and household level mobility with use of rollator. Pt utilizes manual wheelchair for community mobility. Pt with recent discharge from AIR where pt/caregivers received training and necessary DME. Pt currently requires CGA-min A for ADLs and CGA for room level mobility with use of RW. Recommend post-acute OT 3x/week to maximize safety and functional independence.  Today's Interventions: ADL retraining, Education - Patient, Education - Family / caregiver, Functional mobility, Transfer training  Today's Interventions: Patient educated regarding: role of OT, OT POC, fall prevention strategies, and the role of daily participation in ADLs for maintaining/improving activity tolerance while in the hospital              Mobility:   Bed Mobility comments: HOB elevated        Skilled Treatment Performed: Slow, guarded gait w/ decreased Bilateral foot clearance. Pt appears to have RLE hyperextension in stance, controlled by anterior shell AFO. Wide BOS, slow gait speed, and excess use of UEs for support, but no frank loss of balance and pt manages RW well  PT Post Acute Discharge Recommendations: Skilled PT services indicated, 3x weekly    Cognition, Swallow, Speech:   Cognition / Swallow / Speech  Patient's Vision Adequate to Safely Complete Daily Activities: Yes  Patient's Judgement Adequate to Safely Complete Daily Activities: Yes  Patient's Memory Adequate to Safely Complete Daily Activities: Yes  Patient Able to Express Needs/Desires: Yes  Patient has speech problem: No             Assistive Devices: Rollator, Wheelchair-manual, Rolling walker, Reacher, Lift-recliner (shower chair with back)    Precautions:  Safety Interventions  Safety Interventions: assistive device, fall reduction program maintained, family at bedside, isolation precautions, lighting adjusted for tasks/safety, low bed, nonskid shoes/slippers when out of bed  Aspiration Precautions: awake/alert before oral intake, respiratory status monitored  Bleeding Precautions: monitored for signs of bleeding, blood pressure closely monitored  Infection Management: aseptic technique maintained  Isolation Precautions: enteric precautions maintained  Latex Precautions: latex precautions maintained    Medical / Surgical History:   Past Medical History:   Diagnosis Date    Abdominal pain     Acid reflux     occas    Anesthesia complication     itching, shaking, coldness; last few surgeries have gone much better    Cancer (CMS-HCC)     Cataract of right eye     COVID-19 virus infection 01/2019    Cyst of thyroid determined by ultrasound     monitoring    [name]     2 right forearm, 1 left thigh, 1 right scapula, 1 under left clavicle; multiple    Difficult intravenous access  [name] (familial adenomatous polyposis)     [name]     Gastric polyps     History of chemotherapy     last treatment approx 05/2019    History of colon polyps     History of COVID-19 01/2019    Ileus (CMS-HCC) 03/16/2022    Iron deficiency anemia due to chronic blood loss     received iron infusion 11-2019    PONV (postoperative nausea and vomiting)     Rectal bleeding     Syncopal episodes     especially if becoming dehydrated     Past Surgical History:   Procedure Laterality Date    COLON SURGERY      cyroablation      cystis removal      desmoid removal      PR CLOSE ENTEROSTOMY,RESEC+ANAST N/A 10/09/2020    Procedure: ILEOSTOMY TAKEDOWN;  Surgeon: Mickle Asper, MD;  Location: OR [place];  Service: General Surgery PR COLONOSCOPY W/BIOPSY SINGLE/MULTIPLE N/A 10/27/2012    Procedure: COLONOSCOPY, FLEXIBLE, PROXIMAL TO SPLENIC FLEXURE; WITH BIOPSY, SINGLE OR MULTIPLE;  Surgeon: Shirlyn Goltz Mir, MD;  Location: PEDS PROCEDURE ROOM [place];  Service: Gastroenterology    PR COLONOSCOPY W/BIOPSY SINGLE/MULTIPLE N/A 09/14/2013    Procedure: COLONOSCOPY, FLEXIBLE, PROXIMAL TO SPLENIC FLEXURE; WITH BIOPSY, SINGLE OR MULTIPLE;  Surgeon: Shirlyn Goltz Mir, MD;  Location: PEDS PROCEDURE ROOM [place];  Service: Gastroenterology    PR COLONOSCOPY W/BIOPSY SINGLE/MULTIPLE N/A 11/08/2014    Procedure: COLONOSCOPY, FLEXIBLE, PROXIMAL TO SPLENIC FLEXURE; WITH BIOPSY, SINGLE OR MULTIPLE;  Surgeon: Arnold Long Mir, MD;  Location: PEDS PROCEDURE ROOM [facility];  Service: Gastroenterology    PR COLONOSCOPY W/BIOPSY SINGLE/MULTIPLE N/A 12/26/2015    Procedure: COLONOSCOPY, FLEXIBLE, PROXIMAL TO SPLENIC FLEXURE; WITH BIOPSY, SINGLE OR MULTIPLE;  Surgeon: Arnold Long Mir, MD;  Location: PEDS PROCEDURE ROOM [facility];  Service: Gastroenterology    PR COLONOSCOPY W/BIOPSY SINGLE/MULTIPLE N/A 09/02/2017    Procedure: COLONOSCOPY, FLEXIBLE, PROXIMAL TO SPLENIC FLEXURE; WITH BIOPSY, SINGLE OR MULTIPLE;  Surgeon: Arnold Long Mir, MD;  Location: PEDS PROCEDURE ROOM [place];  Service: Gastroenterology    PR COLSC FLX W/REMOVAL LESION BY HOT BX FORCEPS N/A 08/27/2016    Procedure: COLONOSCOPY, FLEXIBLE, PROXIMAL TO SPLENIC FLEXURE; W/REMOVAL TUMOR/POLYP/OTHER LESION, HOT BX FORCEP/CAUTE;  Surgeon: Arnold Long Mir, MD;  Location: PEDS PROCEDURE ROOM [facility];  Service: Gastroenterology    PR COLSC FLX W/RMVL OF TUMOR POLYP LESION SNARE TQ N/A 02/25/2019    Procedure: COLONOSCOPY FLEX; W/REMOV TUMOR/LES BY SNARE;  Surgeon: Helyn Numbers, MD;  [facility]: [facility] PROCEDURES [place] [facility];  Service: Gastroenterology    PR COLSC FLX W/RMVL OF TUMOR POLYP LESION SNARE TQ N/A 03/13/2020    Procedure: COLONOSCOPY FLEX; W/REMOV TUMOR/LES BY SNARE;  Surgeon: Helyn Numbers, MD; Location: [facility]ED[facility] [place] [facility];  Service: Gastroenterology    PR EXC SKIN BENIG 2.1-3 CM TRUNK,ARM,LEG Right 02/25/2017    Procedure: EXCISION, BENIGN LESION INCLUDE MARGINS, EXCEPT SKIN TAG, LEGS; EXCISED DIAMETER 2.1 TO 3.0 CM;  Surgeon: Clarene Duke, MD;  Location: CHILDRENS OR [facility];  Service: Plastics    PR EXC SKIN BENIG 3.1-4 CM TRUNK,ARM,LEG Right 02/25/2017    Procedure: EXCISION, BENIGN LESION INCLUDE MARGINS, EXCEPT SKIN TAG, ARMS; EXCISED DIAMETER 3.1 TO 4.0 CM;  Surgeon: Clarene Duke, MD;  Location: CHILDRENS OR [facility];  Service: Plastics    PR EXC SKIN BENIG >4 CM FACE,FACIAL Right 02/25/2017    Procedure: EXCISION, OTHER BENIGN LES INCLUD MARGIN, FACE/EARS/EYELIDS/NOSE/LIPS/MUCOUS MEMBRANE; EXCISED DIAM >4.0 CM;  Surgeon: Clarene Duke, MD;  Location: CHILDRENS OR [facility];  Service: Plastics    PR EXC TUMOR SOFT TISSUE LEG/ANKLE SUBQ 3+CM Right 08/05/2019    Procedure: EXCISION, TUMOR, SOFT TISSUE OF LEG OR ANKLE AREA, SUBCUTANEOUS; 3 CM OR GREATER;  Surgeon: Arsenio Katz, MD;  Location: MAIN OR [place];  Service: Plastics    PR EXC TUMOR SOFT TISSUE LEG/ANKLE SUBQ <3CM Right 08/05/2019    Procedure: EXCISION, TUMOR, SOFT TISSUE OF LEG OR ANKLE AREA, SUBCUTANEOUS; LESS THAN 3 CM;  Surgeon: Arsenio Katz, MD;  Location: MAIN OR [facility];  Service: Plastics    PR LAP, SURG PROCTECTOMY W J-POUCH N/A 08/10/2020    Procedure: ROBOTIC ASSISTED LAPAROSCOPIC PROCTOCOLECTOMY, ILEAL J POUCH, WITH OSTOMY;  Surgeon: Mickle Asper, MD;  Location: OR [place];  Service: General Surgery    PR NDSC EVAL INTSTINAL POUCH DX W/COLLJ SPEC SPX N/A 01/23/2021    Procedure: ENDO EVAL SM INTEST POUCH; DX;  Surgeon: Modena Nunnery, MD;  [facility]: [facility] [place] [facility];  Service: Gastroenterology    PR NDSC EVAL INTSTINAL POUCH DX W/COLLJ SPEC SPX N/A 08/27/2021    Procedure: ENDO EVAL SM INTEST POUCH; DX;  Surgeon: Hunt Oris, MD;  [facility]; Service: Gastroenterology    PR NDSC EVAL INTSTINAL POUCH DX W/COLLJ SPEC SPX N/A 12/09/2021    Procedure: ENDO EVAL SM INTEST POUCH; DX;  Surgeon: Vidal Schwalbe, MD;  Location: [facility]URES [facility];  Service: Gastroenterology    PR NDSC EVAL INTSTINAL POUCH DX W/COLLJ [facility] SPX Left 04/09/2022    Procedure: ENDO EVAL SM INTEST POUCH; DX;  Surgeon: Modena Nunnery, MD;  [facility] [place] [facility];  Service: Gastroenterology    PR NDSC EVAL INTSTINAL POUCH DX W/COLLJ SPEC SPX N/A 08/05/2022    Procedure: ENDO EVAL SM INTEST POUCH; DX;  Surgeon: Modena Nunnery, MD;  [facility]: [facility]URES [facility];  Service: Gastroenterology    PR NDSC EVAL INTSTINAL POUCH DX W/COLLJ SPEC SPX N/A 03/13/2023    Procedure: ENDO EVAL SM INTEST POUCH; DX;  Surgeon: Carmon Ginsberg, MD;  [facility]: [facility]URES [facility];  Service: Gastroenterology    PR NDSC EVAL INTSTINAL POUCH W/BX SINGLE/MULTIPLE N/A 01/20/2022    Procedure: ENDOSCOPIC EVAL OF SMALL INTESTINAL POUCH; DIAGNOSTIC, No biopsies;  Surgeon: Andrey Farmer, MD;  Location: [facility]URES [facility];  Service: Gastroenterology    PR NDSC EVAL INTSTINAL POUCH W/BX SINGLE/MULTIPLE N/A 02/13/2022    Procedure: ENDOSCOPIC EVAL OF SMALL INTESTINAL POUCH; DIAGNOSTIC, WITH BIOPSY;  Surgeon: Bronson Curb, MD;  Location: GI PROC[facility]URES [facility];  Service: Gastroenterology    PR NDSC EVAL INTSTINAL POUCH W/BX SINGLE/MULTIPLE N/A 03/13/2023    Procedure: ENDOSCOPIC EVAL OF SMALL INTESTINAL POUCH; DIAGNOSTIC, WITH BIOPSY;  Surgeon: Carmon Ginsberg, MD;  Location: GI PROCEDURES [facility];  Service: Gastroenterology    PR UNLISTED PROCEDURE SMALL INTESTINE  01/23/2021    Procedure: UNLISTED PROCEDURE, SMALL INTESTINE;  Surgeon: Modena Nunnery, MD;  Location: [facility] [place] [facility];  Service: Gastroenterology    PR UNLISTED PROCEDURE SMALL INTESTINE  02/13/2022    Procedure: UNLISTED PROCEDURE, SMALL INTESTINE;  Surgeon: Bronson Curb, MD;  Location: GI PROCEDURES [facility];  Service: Gastroenterology    PR UPPER GI ENDOSCOPY,BIOPSY N/A 10/27/2012    Procedure: UGI ENDOSCOPY; WITH BIOPSY, SINGLE OR MULTIPLE;  Surgeon: Shirlyn Goltz Mir, MD;  Location: PEDS PROCEDURE ROOM [facility];  Service: Gastroenterology    PR UPPER  GI ENDOSCOPY,BIOPSY N/A 09/14/2013    Procedure: UGI ENDOSCOPY; WITH BIOPSY, SINGLE OR MULTIPLE;  Surgeon: Shirlyn Goltz Mir, MD;  Location: PEDS PROCEDURE ROOM [facility];  Service: Gastroenterology    PR UPPER GI ENDOSCOPY,BIOPSY N/A 11/08/2014    Procedure: UGI ENDOSCOPY; WITH BIOPSY, SINGLE OR MULTIPLE;  Surgeon: Arnold Long Mir, MD;  Location: PEDS PROCEDURE ROOM [facility];  Service: Gastroenterology    PR UPPER GI ENDOSCOPY,BIOPSY N/A 12/26/2015    Procedure: UGI ENDOSCOPY; WITH BIOPSY, SINGLE OR MULTIPLE;  Surgeon: Arnold Long Mir, MD;  Location: PEDS PROCEDURE ROOM [facility];  Service: Gastroenterology    PR UPPER GI ENDOSCOPY,BIOPSY N/A 08/27/2016    Procedure: UGI ENDOSCOPY; WITH BIOPSY, SINGLE OR MULTIPLE;  Surgeon: Arnold Long Mir, MD;  Location: PEDS PROCEDURE ROOM [facility];  Service: Gastroenterology    PR UPPER GI ENDOSCOPY,BIOPSY N/A 09/02/2017    Procedure: UGI ENDOSCOPY; WITH BIOPSY, SINGLE OR MULTIPLE;  Surgeon: Arnold Long Mir, MD;  Location: PEDS PROCEDURE ROOM [facility];  Service: Gastroenterology    PR UPPER GI ENDOSCOPY,BIOPSY N/A 03/13/2020    Procedure: UGI ENDOSCOPY; WITH BIOPSY, SINGLE OR MULTIPLE;  Surgeon: Helyn Numbers, MD;  Location: [facility] [place] [facility];  Service: Gastroenterology    PR UPPER GI ENDOSCOPY,BIOPSY N/A 09/05/2021    Procedure: UGI ENDOSCOPY; WITH BIOPSY, SINGLE OR MULTIPLE;  Surgeon: Wendall Papa, MD;  Location: GI PROCEDURES [facility];  Service: Gastroenterology    PR UPPER GI ENDOSCOPY,DIAGNOSIS N/A 01/20/2022    Procedure: UGI ENDO, INCLUDE ESOPHAGUS, STOMACH, & DUODENUM &/OR JEJUNUM; DX W/WO COLLECTION SPECIMN, BY BRUSH OR WASH;  Surgeon: Andrey Farmer, MD;  [facility]: [facility]URES [facility];  Service: Gastroenterology    TUMOR REMOVAL      multiple-head, neck, back, hand, right flank, multiple        Social History:   Social History     Tobacco Use    Smoking status: Never     Passive exposure: Past    Smokeless tobacco: Never   Vaping Use    Vaping status: Never Used   Substance Use Topics    Alcohol use: Never    Drug use: Never       Living Environment: House  Lives With: Father, Mother  Home Living: Multi-level home, Bed/bath upstairs, 1/2 bath on main level, Stairs to alternate level with rails, Stairs to enter without rails, Stairs to alternate level without rails, Handicapped height toilet, Tub/shower unit, Shower chair with back, Standard height toilet  Number of Stairs to Enter (outside): 2  Rail placement (inside): Rail on right side  Number of Stairs to Alternate level (inside): 14  Caregiver Identified?: Yes  Caregiver Availability: 24 hours  Caregiver Ability: Limited lifting (Pt's mother cleared to assist w/ t/fs at AIR per pt and pt's mother)    Family History: Reviewed and non-contributory to rehab needs  family history includes Alcohol abuse in her paternal grandfather; Arthritis in her maternal grandfather; Asthma in her maternal grandfather; COPD in her paternal grandmother; Cancer in her maternal grandmother; Diabetes in her maternal grandmother; Hypertension in her maternal grandmother; Miscarriages / [place] in her paternal grandmother; No Known Problems in her brother, father, maternal aunt, maternal uncle, mother, paternal aunt, paternal uncle, and sister; Other in her maternal grandmother; Stroke in her maternal grandmother; Thyroid disease in her maternal grandmother.    Allergies:   Adhesive tape-silicones; Ferrlecit [sodium ferric gluconat-sucrose]; Levofloxacin; Methylnaltrexone; Neomycin; Papaya; Morphine; Zosyn [piperacillin-tazobactam]; Compazine [prochlorperazine]; Iron analogues; Reglan [metoclopramide hcl]; Iron dextran; and Latex, natural rubber  Medications:   Scheduled    acetaminophen (TYLENOL) oral liquid Q8H SCH    ammonium lactate (LAC-HYDRIN) 12 % lotion 1 Application BID    apixaban (ELIQUIS) tablet 5 mg BID    baclofen (LIORESAL) tablet 5 mg TID    buprenorphine 20 mcg/hour transdermal patch 1 patch Weekly    cefdinir (OMNICEF) oral suspension Daily    escitalopram oxalate (LEXAPRO) tablet 5 mg Daily    famotidine (PEPCID) oral suspension Nightly    flu vaccine TS 2024-25(32mos up)(PF)(FLULAVAL, FLUARIX, FLUZONE) During hospitalization    fluticasone propionate (FLONASE) 50 mcg/actuation nasal spray 1 spray Weekly    lidocaine (ASPERCREME) 4 % 1 patch Daily    [Provider Hold] OLANZapine zydis (ZYPREXA) disintegrating tablet 5 mg Q PM    pantoprazole (Protonix) injection 40 mg BID    promethazine (PHENERGAN) tablet 12.5 mg Q6H    scopolamine (TRANSDERM-SCOP) 1 mg over 3 days topical patch 1 mg Q72H    sucralfate (CARAFATE) oral suspension QID    vancomycin (FIRVANQ) (50 mg/mL) oral solution Q6H     PRN cetirizine, 20 mg, Daily PRN  diphenhydrAMINE, 25 mg, Nightly PRN  emollient combination no.92, , Q1H PRN  hydrocortisone, , BID PRN  hydrocortisone, , BID PRN  HYDROmorphone, 1 mg, Q6H PRN  hyoscyamine, 125 mcg, Q4H PRN  loperamide, 2 mg, QID PRN  LORazepam, 1 mg, BID PRN  melatonin, 3 mg, Nightly PRN  naloxegol, 12.5 mg, Daily PRN  ondansetron, 8 mg, Q8H PRN  oxyCODONE, 20 mg, Q4H PRN  simethicone, 80 mg, BID PRN  zinc oxide-cod liver oil, , Daily PRN      Continuous Infusions      Review of Systems:    General ROS: positive for  - fatigue and sleep disturbance  Psychological ROS: negative  Ophthalmic ROS: negative  Respiratory ROS: no cough, shortness of breath, or wheezing  Cardiovascular ROS: no chest pain or dyspnea on exertion  Gastrointestinal ROS: + abdominal pain  Genito-Urinary ROS: no dysuria, trouble voiding, or hematuria  Musculoskeletal ROS: negative  Neurological ROS: + bilateral foot drop  Dermatological ROS:  + rash  Full 10 systems reviewed and neg, unless noted in HPI  OBJECTIVE:     Vitals:  Temp:  [36.6 ??C (97.9 ??F)-37.1 ??C (98.8 ??F)] 36.6 ??C (97.9 ??F)  Heart Rate:  [72-94] 83  Resp:  [16-18] 16  BP: (104-130)/(56-80) 104/80  MAP (mmHg):  [70-94] 88  SpO2:  [96 %-98 %] 98 %  BMI (Calculated):  [20.51] 20.51    Physical Exam:    GEN: Lying in bed in NAD.  HEENT: Atraumatic. Normocephalic. Moist mucous membranes. Trachea midline.  RESP: NWOB on RA.  CV: RRR, no pedal edema  GI: abd soft, NTND  GU: no Foley   SKIN: no rashes or ecchymoses on exposed skin  MSK:  no notable contractures, no visible swelling or erythema over joints, joints NTTP  NEURO:  Mental Status: A&Ox3, attention intact, speech fluid and coherent, follows commands well  Cranial Nerve:     visual acuity intact, no visual fields deficits, pupils equal, EOMI, facial sensation bilaterally, no facial droop, hearing grossly intact b/l, shoulder shrug full and equal, tongue protrudes midline  Sensory: BUE and BLE sensation intact to light touch  Motor:     RUE/LUE: shoulder abd 4/4, biceps 4/4, triceps 4/4, wrist extension 4/4, hand grasp 4/4    RLE/LLE: hip flexion 3/3, knee extension 3/3, DF 1/1, 1st toe extension 1/1 PF 2-/2-  Tone: within normal  limits, no spasticity noted  Reflexes: no clonus  Cerebellar: no abnml or extraneous mvmts, no pronator drift  PSYCH: mood euthymic, affect appropriate, thought process logica     Labs and Diagnostic Studies: Reviewed   CBC - Results in Past 30 Days  Result Component Current Result Ref Range Previous Result Ref Range   HCT 35.5 (05/04/2023) 34.0 - 44.0 % 33.4 (L) (05/01/2023) 34.0 - 44.0 %   HGB 12.0 (05/04/2023) 11.3 - 14.9 g/dL 32.4 (06/09/270) 53.6 - 14.9 g/dL   MCH 64.4 (0/34/7425) 25.9 - 32.4 pg 29.0 (05/01/2023) 25.9 - 32.4 pg   MCHC 33.9 (05/04/2023) 32.0 - 36.0 g/dL 95.6 (3/87/5643) 32.9 - 36.0 g/dL   MCV 51.8 (8/41/6606) 77.6 - 95.7 fL 85.9 (05/01/2023) 77.6 - 95.7 fL   MPV 10.1 (05/04/2023) 6.8 - 10.7 fL 9.5 (05/01/2023) 6.8 - 10.7 fL   Platelet 208 (05/04/2023) 150 - 450 10*9/L 220 (05/01/2023) 150 - 450 10*9/L   RBC 4.13 (05/04/2023) 3.95 - 5.13 10*12/L 3.89 (L) (05/01/2023) 3.95 - 5.13 10*12/L   WBC 8.3 (05/04/2023) 3.6 - 11.2 10*9/L 9.3 (05/01/2023) 3.6 - 11.2 10*9/L     BMP - Results in Past 30 Days  Result Component Current Result Ref Range Previous Result Ref Range   BUN 10 (05/04/2023) 9 - 23 mg/dL 14 (05/08/6008) 9 - 23 mg/dL   Chloride 932 (3/55/7322) 98 - 107 mmol/L 104 (05/01/2023) 98 - 107 mmol/L   CO2 27.0 (05/04/2023) 20.0 - 31.0 mmol/L 29.7 (05/01/2023) 20.0 - 31.0 mmol/L   Creatinine 0.51 (L) (05/04/2023) 0.55 - 1.02 mg/dL 0.25 (L) (07/04/621) 7.62 - 1.02 mg/dL   Glucose 831 (07/25/6158) 70 - 179 mg/dL 97 (7/37/1062) 70 - 694 mg/dL   Potassium 3.9 (8/54/6270) 3.4 - 4.8 mmol/L 4.1 (05/01/2023) 3.5 - 5.1 mmol/L   Sodium 141 (05/04/2023) 135 - 145 mmol/L 143 (05/01/2023) 135 - 145 mmol/L     Coagulation -   No results found for requested labs within last 30 days.     Cardiac markers -   No results found for requested labs within last 30 days.     LFT's - Results in Past 30 Days  Result Component Current Result Ref Range Previous Result Ref Range   Albumin 3.7 (04/10/2023) 3.4 - 5.0 g/dL 3.2 (L) (3/50/0938) 3.4 - 5.0 g/dL   Alkaline Phosphatase 120 (H) (04/10/2023) 46 - 116 U/L 103 (04/08/2023) 46 - 116 U/L   ALT 34 (04/10/2023) 10 - 49 U/L 45 (04/08/2023) 10 - 49 U/L   AST 20 (04/10/2023) <=34 U/L 17 (04/08/2023) <=34 U/L   Total Bilirubin 0.2 (L) (04/10/2023) 0.3 - 1.2 mg/dL 0.2 (L) (1/82/9937) 0.3 - 1.2 mg/dL       Radiology Results: Reviewed   No results found.    For coding purposes:   - This patient was seen by the provider Darrick Grinder, MD).  - This encounter should be coded as a an inpatient consultation.    I Darrick Grinder, MD) personally spent 65 minutes face-to-face and non-face-to-face in the care of this patient, which includes all pre, intra, and post visit time on the date of service. All documented time was specific to the E/M visit and does not include any procedures that may have been performed. Care time for the patient included face-to-face time with the patient, reviewing the patient's chart, communicating with the family and/or other professionals and coordinating care.

## 2023-05-04 NOTE — Unmapped (Signed)
 =  Department of Anesthesiology  Pain Medicine Division    Chronic Pain Consult Note      Requesting Attending Physician:  Ansel Bong, MD  Service Requesting Consult:  Oncology/Hematology (MDE)    Assessment/Recommendations:  The patient was seen in consultation on request of Ansel Bong, MD regarding assistance with pain management. The patient is not obtaining adequate pain relief on current medication regimen.     The patient is a 23 y.o. female with history of [name] c/b desmoid fibromatosis, recurrent ileus, GI bleeds, DVT, MDD, and GAD that presented to [facility] with C diff infection currently on PO vancomycin therapy. Patient noted interval improvement in chronic abdominal pain associated with desmoid fibromatosis and decreased opioid use prior to recent pain flare exacerbated by C. Diff.    Please refer to 03/25/23 treatment plan note regarding patient's default pain plan for admissions    Her clinical picture is complicated by frequent admissions where pain control is a recurrent issue.  While she will often present with a bona fide organic insult that triggers acute flares of chronic abdominal pain, immediate escalations to treat the acute pain lead to positive feedback loops with deleterious effects.  Her clinical course often becomes complicated by narcotic bowel syndrome, visceral hyperalgesia, and generalized opioid-induced hyperalgesia that prolongs admission after the inciting, acute pathology resolves.     As a result, while it is very important to control her acute pain when she presents for admission, it is likewise important to avoid over-escalation of her therapy so as not to precipitate the sequelae of high-dose opioid exposure. Of note, during prolonged hospitalizations Lyann has had multiple interventions trialed to optimize her pain control including utilization of IV lidocaine infusions and IV ketamine infusions.  She is rarely benefited from IV lidocaine infusions, though they can be utilized as third line intervention in the future.  She does tend to benefit from IV ketamine infusions but, per [facility] pain division policy, patients can only have 4 ketamine infusions per year.  Her last ketamine infusions were on the following dates: 08/01/22, 08/14/22, 10/09/22.  Therefore, Dovie has 1 remaining ketamine infusion until 08/01/2023 and this last infusion should be reserved for the most severe of pain flares.     Recommendations:  -The chronic pain service is a consult service and does not place orders, just makes recommendations (except ketamine and lidocaine infusions)  -Please evaluate all patients on opioids for appropriateness of prescribing narcan at discharge.  The chronic pain service can assist with this.  Nasal narcan covered by most insurance.  -Recommendations given apply to the current hospitalization and do not reflect long term recommendations.    - Continue pain regimen as follows:   Butrans 74mcg/hr weekly patch  Tylenol 1000mg  TID  Baclofen 5mg  TID    - Opioid dosing per patient's previously agreed upon maximum dosing protocol as outlined in 03/25/23 treatment plan note   Continue Oxycodone liquid formulation 20mg  q4hr PRN    Do not give within one hour of IV dilaudid   Increase Dilaudid IV to 1mg  q4hr PRN (max dose)    Do not give within one hour of PO oxycodone    - Because patient reports that she does not tolerate PO formulations (both pill and liquid) of benadryl due to burning sensation, though this has not been relayed to me in the past, given her rash will recommend 25mg  IV benadryl in NS to be administered over course of one hour. This should maintain efficacy of  benadryl without adverse effects.    Home MME: 105 (Oxycodone and Buprenorphine)  Current MME: 187.5    We will continue to follow.peripherally    Medications trialed during this admission (or pertinent in past):   Ketamine infusions, lidocaine infusion, Butrans patch, oxycodone, IV dilaudid    Naloxone Rx at discharge?  Is patient on opioids? Yes.  1)Is dose >50MME?  Yes.  2) Is patient prescribed a benzodiazepine (w opioids)? Yes.  3)Hx of overdose?  No.  4) Hx of substance use disorder? No.  5) Opioids likely to last greater than a week after discharge? Yes.    If yes to 2 or more, prescribe naloxone at discharge.  Nasal narcan for most insured (Nasal narcan 4mg /actuation, prescribe 1 kit, instructions at [email or web address]).  For uninsured, chronic pain can work to assist in finding an option.  OTC nasal narcan now available at most pharmacies for around $45.    History of Present Illness:    Reason for Consult:  Pain control in setting of complex abdominal history and active C diff infection    Jaynie Salem Mastrogiovanni is seen in consultation at the request of [facility] service for evaluation of  pain management.  The pain has been chronic in nature but exacerbated in the setting of recent C. Diff infection.  The patient describes Her pain as aching, pressing, stabbing, and tender.   She rates Her pain level as 9/10.  The pain is constant and tends to be continuous, episodic, and worse at night . The symptoms radiate to the RLQ and LLQ. Her pain is associated with weakness    The patient reports that her pain was previously well controlled and improving while at [facility]. Her and mother explain that she would go up to 8 hours without pain medication during her [facility] admission and had improving pain control. Once she began to develop diarrhea associated with C. Difficile infection, her abdominal pain was significantly exacerbated and she required much higher doses of opioid medications. She explains that her abdominal pain is significantly worsened associated with PO intake, receiving tube feeds, and with bowel movements. Her diarrhea symptoms are improving, but she states that she has fallen behind on her pain regimen and continues to have breakthrough pain.    Patient also notes mild rash on her extremities, thought to be secondary to vancomycin vs. Idiopathic spontaneous hives. Patient unable to tolerate PO benadryl and PO atarax due to burning sensation and discomfort associated with these medications.    Analgesia Evaluation:  Pain at minimum: 6/10  Pain at maximum: 9/10    For the pain, the patient's current medication regimen includes:  Butrans 44mcg/hr weekly patch, Tylenol 1000mg  TID, Baclofen 5mg  TID, Oxycodone 20mg  q4hr PRN, Dilaudid 1mg  q6hr PRN     Prior to admission, the patient was on home opiate medications.  Shehas been followed by a pain clinic.  The [place] CSRS was reviewed.    Allergies    Allergies   Allergen Reactions    Adhesive Tape-Silicones Hives and Rash     Paper tape and Tegederm ok. Biopatch causes blistering but has tolerated CHG Tegaderm    Ferrlecit [Sodium Ferric Gluconat-Sucrose] Swelling and Rash    Levofloxacin Swelling and Rash     Swelling in mouth, rash,     Methylnaltrexone      Per Patient: I lost bowel control, severe abdominal cramping, and elevated BP    Neomycin Swelling     Rxn after ear drops; ear swelling  Papaya Hives    Morphine Nausea And Vomiting    Zosyn [Piperacillin-Tazobactam] Itching and Rash     Red and itchy    Compazine [Prochlorperazine] Other (See Comments)     Extreme agitation    Iron Analogues     Reglan [Metoclopramide Hcl] Diarrhea    Iron Dextran Itching     Received iron dextran 06/08/22 over 12 hours, had itching and redness/flushing during the infusion and for a couple days after. Required IV benadryl w/flares between doses and ultimately treated w/IV methylpred for 2 days.     Latex, Natural Rubber Rash       Home Medications    Medications Prior to Admission   Medication Sig Dispense Refill Last Dose/Taking    acetaminophen (TYLENOL) 650 mg/20.3 mL Soln 31.2 mL (1,000 mg total) by Enteral tube: post-pyloric (duodenum, jejunum) route every eight (8) hours.       ammonium lactate (LAC-HYDRIN) 12 % lotion Apply 1 Application topically two (2) times a day.       apixaban (ELIQUIS) 5 mg Tab Take 1 tablet (5 mg total) by mouth two (2) times a day.       baclofen (LIORESAL) 5 mg Tab tablet Take 1 tablet (5 mg total) by mouth Three (3) times a day.       buprenorphine (BUTRANS) 20 mcg/hour PTWK transdermal patch Place 1 patch on the skin once a week.       escitalopram oxalate (LEXAPRO) 5 MG tablet Take 1 tablet (5 mg total) by mouth daily.       fluticasone propionate (FLONASE) 50 mcg/actuation nasal spray 1 spray into each nostril once a week.       hydrocortisone 1 % cream Apply topically two (2) times a day as needed (itching).       LORazepam (ATIVAN) 1 MG tablet Take 1 tablet (1 mg total) by mouth two (2) times a day as needed for anxiety.       melatonin 3 mg Tab Take 1 tablet (3 mg total) by mouth nightly as needed.       multivitamin with folic acid 400 mcg Tab tablet Take 1 tablet by mouth daily.       naloxegol (MOVANTIK) 12.5 mg tablet Take 1 tablet (12.5 mg total) by mouth daily as needed (constipation).       pantoprazole (PROTONIX) 40 mg injection Infuse 10 mL (40 mg total) into a venous catheter two (2) times a day.       promethazine (PHENERGAN) 12.5 MG tablet Take 1 tablet (12.5 mg total) by mouth every six (6) hours as needed for nausea.       psyllium (METAMUCIL) 3.4 gram packet Dissolve as directed and take 1 packet by mouth Three (3) times a day.       scopolamine (TRANSDERM-SCOP) 1 mg over 3 days Place 1 patch (1 mg total) on the skin every third day.       simethicone (MYLICON) 80 MG chewable tablet Chew 1 tablet (80 mg total) by mouth every six (6) hours as needed for flatulence (bloating, gas).       sucralfate (CARAFATE) 100 mg/mL suspension Take 10 mL (1 g total) by mouth four (4) times a day.       vancomycin (FIRVANQ) 50 mg/mL SolR oral solution Take 2.5 mL (125 mg total) by mouth every six (6) hours.       zinc oxide-cod liver oil (DESITIN 40%) 40 % Pste Apply topically daily as needed (perineal  irritation).          Inpatient Medications  Current Facility-Administered Medications   Medication Dose Route Frequency Provider Last Rate Last Admin    acetaminophen (TYLENOL) oral liquid  1,000 mg Enteral tube: post-pyloric (duodenum, jejunum) Q8H SCH Resweber, Collier Salina, MD   1,000 mg at 05/04/23 0536    ammonium lactate (LAC-HYDRIN) 12 % lotion 1 Application  1 Application Topical BID Resweber, Collier Salina, MD   1 Application at 05/03/23 2130    apixaban (ELIQUIS) tablet 5 mg  5 mg Oral BID Resweber, Collier Salina, MD   5 mg at 05/03/23 2129    baclofen (LIORESAL) tablet 5 mg  5 mg Oral TID Cydney Ok, MD   5 mg at 05/03/23 2128    buprenorphine 20 mcg/hour transdermal patch 1 patch  1 patch Transdermal Weekly Resweber, Collier Salina, MD   1 patch at 05/02/23 0940    cefdinir (OMNICEF) oral suspension  300 mg Oral Daily Resweber, Collier Salina, MD   300 mg at 05/03/23 0844    cetirizine (ZYRTEC) oral syrup  20 mg Enteral tube: post-pyloric (duodenum, jejunum) Daily PRN Resweber, Collier Salina, MD   20 mg at 05/04/23 0536    diphenhydrAMINE (BENADRYL) capsule/tablet 25 mg  25 mg Oral Nightly PRN Resweber, Collier Salina, MD        emollient combination no.92 (LUBRIDERM) lotion   Topical Q1H PRN Resweber, Collier Salina, MD        escitalopram oxalate (LEXAPRO) tablet 5 mg  5 mg Enteral tube: post-pyloric (duodenum, jejunum) Daily Resweber, Collier Salina, MD   5 mg at 05/03/23 4627    famotidine (PEPCID) oral suspension  20 mg Enteral tube: post-pyloric (duodenum, jejunum) Nightly Resweber, Collier Salina, MD   20 mg at 05/03/23 2130    flu vaccine TS 2024-25(22mos up)(PF)(FLULAVAL, FLUARIX, FLUZONE)  0.5 mL Intramuscular During hospitalization Resweber, Collier Salina, MD        fluticasone propionate (FLONASE) 50 mcg/actuation nasal spray 1 spray  1 spray Topical Weekly Resweber, Collier Salina, MD   1 spray at 05/02/23 0946    hydrocortisone 1 % cream   Topical BID PRN Resweber, Collier Salina, MD        hydrocortisone 2.5 % cream   Topical BID PRN Resweber, Collier Salina, MD        HYDROmorphone (PF) (DILAUDID) injection 1 mg  1 mg Intravenous Q6H PRN Gracelyn Nurse, MD   1 mg at 05/04/23 0423    hyoscyamine (LEVSIN) tablet 125 mcg  125 mcg Oral Q4H PRN Resweber, Collier Salina, MD        lidocaine (ASPERCREME) 4 % 1 patch  1 patch Transdermal Daily Resweber, Collier Salina, MD   1 patch at 05/03/23 0350    loperamide (IMODIUM) oral solution  2 mg Oral QID PRN Resweber, Collier Salina, MD        LORazepam (ATIVAN) tablet 1 mg  1 mg Oral BID PRN Resweber, Collier Salina, MD   1 mg at 05/03/23 0938    melatonin tablet 3 mg  3 mg Oral Nightly PRN Resweber, Collier Salina, MD        naloxegol (MOVANTIK) tablet 12.5 mg  12.5 mg Oral Daily PRN Resweber, Collier Salina, MD        [Provider Hold] OLANZapine zydis (ZYPREXA) disintegrating tablet 5 mg  5 mg Oral Q PM Resweber, Collier Salina, MD        ondansetron [facility]) injection 8 mg  8 mg Intravenous Q8H PRN  Resweber, Collier Salina, MD   8 mg at 05/04/23 0423    oxyCODONE (ROXICODONE) 5 mg/5 mL solution 20 mg  20 mg Oral Q4H PRN Gracelyn Nurse, MD   20 mg at 05/04/23 0747    pantoprazole (Protonix) injection 40 mg  40 mg Intravenous BID Cydney Ok, MD   40 mg at 05/03/23 2129    promethazine (PHENERGAN) tablet 12.5 mg  12.5 mg Oral Q6H Resweber, Collier Salina, MD   12.5 mg at 05/04/23 0308    scopolamine (TRANSDERM-SCOP) 1 mg over 3 days topical patch 1 mg  1 patch Topical Q72H Resweber, Collier Salina, MD        simethicone (MYLICON) chewable tablet 80 mg  80 mg Oral BID PRN Revankar, Rishab R, MD        sucralfate (CARAFATE) oral suspension  1 g Oral QID Resweber, Collier Salina, MD   1 g at 05/04/23 0536    vancomycin [facility]) (50 mg/mL) oral solution  125 mg Oral Q6H Resweber, Collier Salina, MD   125 mg at 05/04/23 0308    zinc oxide-cod liver oil (DESITIN 40%) Paste   Topical Daily PRN Resweber, Collier Salina, MD             Past Medical History    Past Medical History:   Diagnosis Date    Abdominal pain     Acid reflux     occas    Anesthesia complication     itching, shaking, coldness; last few surgeries have gone much better    Cancer (CMS-HCC) Cataract of right eye     COVID-19 virus infection 01/2019    Cyst of thyroid determined by ultrasound     monitoring    [name]     2 right forearm, 1 left thigh, 1 right scapula, 1 under left clavicle; multiple    Difficult intravenous access     [name] (familial adenomatous polyposis)     [name]     Gastric polyps     History of chemotherapy     last treatment approx 05/2019    History of colon polyps     History of COVID-19 01/2019    Ileus (CMS-HCC) 03/16/2022    Iron deficiency anemia due to chronic blood loss     received iron infusion 11-2019    PONV (postoperative nausea and vomiting)     Rectal bleeding     Syncopal episodes     especially if becoming dehydrated           Past Surgical History    Past Surgical History:   Procedure Laterality Date    COLON SURGERY      cyroablation      cystis removal      desmoid removal      PR CLOSE ENTEROSTOMY,RESEC+ANAST N/A 10/09/2020    Procedure: ILEOSTOMY TAKEDOWN;  Surgeon: Mickle Asper, MD;  Location: OR [place];  Service: General Surgery    PR COLONOSCOPY W/BIOPSY SINGLE/MULTIPLE N/A 10/27/2012    Procedure: COLONOSCOPY, FLEXIBLE, PROXIMAL TO SPLENIC FLEXURE; WITH BIOPSY, SINGLE OR MULTIPLE;  Surgeon: Shirlyn Goltz Mir, MD;  Location: PEDS PROCEDURE ROOM [facility];  Service: Gastroenterology    PR COLONOSCOPY W/BIOPSY SINGLE/MULTIPLE N/A 09/14/2013    Procedure: COLONOSCOPY, FLEXIBLE, PROXIMAL TO SPLENIC FLEXURE; WITH BIOPSY, SINGLE OR MULTIPLE;  Surgeon: Shirlyn Goltz Mir, MD;  Location: PEDS PROCEDURE ROOM [facility];  Service: Gastroenterology    PR COLONOSCOPY W/BIOPSY SINGLE/MULTIPLE N/A 11/08/2014    Procedure: COLONOSCOPY, FLEXIBLE, PROXIMAL  TO SPLENIC FLEXURE; WITH BIOPSY, SINGLE OR MULTIPLE;  Surgeon: Arnold Long Mir, MD;  Location: PEDS PROCEDURE ROOM [facility];  Service: Gastroenterology    PR COLONOSCOPY W/BIOPSY SINGLE/MULTIPLE N/A 12/26/2015    Procedure: COLONOSCOPY, FLEXIBLE, PROXIMAL TO SPLENIC FLEXURE; WITH BIOPSY, SINGLE OR MULTIPLE;  Surgeon: Arnold Long Mir, MD;  Location: PEDS PROCEDURE ROOM [facility];  Service: Gastroenterology    PR COLONOSCOPY W/BIOPSY SINGLE/MULTIPLE N/A 09/02/2017    Procedure: COLONOSCOPY, FLEXIBLE, PROXIMAL TO SPLENIC FLEXURE; WITH BIOPSY, SINGLE OR MULTIPLE;  Surgeon: Arnold Long Mir, MD;  Location: PEDS PROCEDURE ROOM [place];  Service: Gastroenterology    PR COLSC FLX W/REMOVAL LESION BY HOT BX FORCEPS N/A 08/27/2016    Procedure: COLONOSCOPY, FLEXIBLE, PROXIMAL TO SPLENIC FLEXURE; W/REMOVAL TUMOR/POLYP/OTHER LESION, HOT BX FORCEP/CAUTE;  Surgeon: Arnold Long Mir, MD;  Location: PEDS PROCEDURE ROOM [facility];  Service: Gastroenterology    PR COLSC FLX W/RMVL OF TUMOR POLYP LESION SNARE TQ N/A 02/25/2019    Procedure: COLONOSCOPY FLEX; W/REMOV TUMOR/LES BY SNARE;  Surgeon: Helyn Numbers, MD;  [facility] [place] [facility];  Service: Gastroenterology    PR COLSC FLX W/RMVL OF TUMOR POLYP LESION SNARE TQ N/A 03/13/2020    Procedure: COLONOSCOPY FLEX; W/REMOV TUMOR/LES BY SNARE;  Surgeon: Helyn Numbers, MD;  [facility] [facility];  Service: Gastroenterology    PR EXC SKIN BENIG 2.1-3 CM TRUNK,ARM,LEG Right 02/25/2017    Procedure: EXCISION, BENIGN LESION INCLUDE MARGINS, EXCEPT SKIN TAG, LEGS; EXCISED DIAMETER 2.1 TO 3.0 CM;  Surgeon: Clarene Duke, MD;  Location: CHILDRENS OR [facility];  Service: Plastics    PR EXC SKIN BENIG 3.1-4 CM TRUNK,ARM,LEG Right 02/25/2017    Procedure: EXCISION, BENIGN LESION INCLUDE MARGINS, EXCEPT SKIN TAG, ARMS; EXCISED DIAMETER 3.1 TO 4.0 CM;  Surgeon: Clarene Duke, MD;  Location: CHILDRENS OR [facility];  Service: Plastics    PR EXC SKIN BENIG >4 CM FACE,FACIAL Right 02/25/2017    Procedure: EXCISION, OTHER BENIGN LES INCLUD MARGIN, FACE/EARS/EYELIDS/NOSE/LIPS/MUCOUS MEMBRANE; EXCISED DIAM >4.0 CM;  Surgeon: Clarene Duke, MD;  Location: CHILDRENS OR [facility];  Service: Plastics    PR EXC TUMOR SOFT TISSUE LEG/ANKLE SUBQ 3+CM Right 08/05/2019    Procedure: EXCISION, TUMOR, SOFT TISSUE OF LEG OR ANKLE AREA, SUBCUTANEOUS; 3 CM OR GREATER;  Surgeon: Arsenio Katz, MD;  Location: MAIN OR [place];  Service: Plastics    PR EXC TUMOR SOFT TISSUE LEG/ANKLE SUBQ <3CM Right 08/05/2019    Procedure: EXCISION, TUMOR, SOFT TISSUE OF LEG OR ANKLE AREA, SUBCUTANEOUS; LESS THAN 3 CM;  Surgeon: Arsenio Katz, MD;  Location: MAIN OR [facility];  Service: Plastics    PR LAP, SURG PROCTECTOMY W J-POUCH N/A 08/10/2020    Procedure: ROBOTIC ASSISTED LAPAROSCOPIC PROCTOCOLECTOMY, ILEAL J POUCH, WITH OSTOMY;  Surgeon: Mickle Asper, MD;  Location: OR [place];  Service: General Surgery    PR NDSC EVAL INTSTINAL POUCH DX W/COLLJ SPEC SPX N/A 01/23/2021    Procedure: ENDO EVAL SM INTEST POUCH; DX;  Surgeon: Modena Nunnery, MD;  [facility] [place] [facility];  Service: Gastroenterology    PR NDSC EVAL INTSTINAL POUCH DX W/COLLJ SPEC SPX N/A 08/27/2021    Procedure: ENDO EVAL SM INTEST POUCH; DX;  Surgeon: Hunt Oris, MD;  Location: GI PROC[facility]URES [facility];  Service: Gastroenterology    PR NDSC EVAL INTSTINAL POUCH DX W/COLLJ SPEC SPX N/A 12/09/2021    Procedure: ENDO EVAL SM INTEST POUCH; DX;  Surgeon: Vidal Schwalbe, MD;  Location: GI PROCEDURES [facility];  Service: Gastroenterology    PR NDSC EVAL INTSTINAL POUCH DX W/COLLJ [facility] SPX Left 04/09/2022    Procedure: ENDO EVAL SM INTEST POUCH; DX;  Surgeon: Modena Nunnery, MD;  [facility]: [facility] [place] [facility];  Service: Gastroenterology    PR NDSC EVAL INTSTINAL POUCH DX W/COLLJ SPEC SPX N/A 08/05/2022    Procedure: ENDO EVAL SM INTEST POUCH; DX;  Surgeon: Modena Nunnery, MD;  Location: GI PROCEDURES [facility];  Service: Gastroenterology    PR NDSC EVAL INTSTINAL POUCH DX W/COLLJ SPEC SPX N/A 03/13/2023    Procedure: ENDO EVAL SM INTEST POUCH; DX;  Surgeon: Carmon Ginsberg, MD;  Location: GI PROCEDURES [facility];  Service: Gastroenterology    PR NDSC EVAL INTSTINAL POUCH W/BX SINGLE/MULTIPLE N/A 01/20/2022    Procedure: ENDOSCOPIC EVAL OF SMALL INTESTINAL POUCH; DIAGNOSTIC, No biopsies;  Surgeon: Andrey Farmer, MD;  Location: GI PROCEDURES [facility];  Service: Gastroenterology    PR NDSC EVAL INTSTINAL POUCH W/BX SINGLE/MULTIPLE N/A 02/13/2022    Procedure: ENDOSCOPIC EVAL OF SMALL INTESTINAL POUCH; DIAGNOSTIC, WITH BIOPSY;  Surgeon: Bronson Curb, MD;  Location: GI PROCEDURES [facility];  Service: Gastroenterology    PR NDSC EVAL INTSTINAL POUCH W/BX SINGLE/MULTIPLE N/A 03/13/2023    Procedure: ENDOSCOPIC EVAL OF SMALL INTESTINAL POUCH; DIAGNOSTIC, WITH BIOPSY;  Surgeon: Carmon Ginsberg, MD;  Location: GI PROCEDURES [facility];  Service: Gastroenterology    PR UNLISTED PROCEDURE SMALL INTESTINE  01/23/2021    Procedure: UNLISTED PROCEDURE, SMALL INTESTINE;  Surgeon: Modena Nunnery, MD;  [facility]: [facility] [place] [facility];  Service: Gastroenterology    PR UNLISTED PROCEDURE SMALL INTESTINE  02/13/2022    Procedure: UNLISTED PROCEDURE, SMALL INTESTINE;  Surgeon: Bronson Curb, MD;  Location: GI PROCEDURES [facility];  Service: Gastroenterology    PR UPPER GI ENDOSCOPY,BIOPSY N/A 10/27/2012    Procedure: UGI ENDOSCOPY; WITH BIOPSY, SINGLE OR MULTIPLE;  Surgeon: Shirlyn Goltz Mir, MD;  Location: PEDS PROCEDURE ROOM [facility];  Service: Gastroenterology    PR UPPER GI ENDOSCOPY,BIOPSY N/A 09/14/2013    Procedure: UGI ENDOSCOPY; WITH BIOPSY, SINGLE OR MULTIPLE;  Surgeon: Shirlyn Goltz Mir, MD;  Location: PEDS PROCEDURE ROOM [facility];  Service: Gastroenterology    PR UPPER GI ENDOSCOPY,BIOPSY N/A 11/08/2014    Procedure: UGI ENDOSCOPY; WITH BIOPSY, SINGLE OR MULTIPLE;  Surgeon: Arnold Long Mir, MD;  Location: PEDS PROCEDURE ROOM [facility];  Service: Gastroenterology    PR UPPER GI ENDOSCOPY,BIOPSY N/A 12/26/2015    Procedure: UGI ENDOSCOPY; WITH BIOPSY, SINGLE OR MULTIPLE;  Surgeon: Arnold Long Mir, MD;  Location: PEDS PROCEDURE ROOM [facility];  Service: Gastroenterology PR UPPER GI ENDOSCOPY,BIOPSY N/A 08/27/2016    Procedure: UGI ENDOSCOPY; WITH BIOPSY, SINGLE OR MULTIPLE;  Surgeon: Arnold Long Mir, MD;  Location: PEDS PROCEDURE ROOM [facility];  Service: Gastroenterology    PR UPPER GI ENDOSCOPY,BIOPSY N/A 09/02/2017    Procedure: UGI ENDOSCOPY; WITH BIOPSY, SINGLE OR MULTIPLE;  Surgeon: Arnold Long Mir, MD;  Location: PEDS PROCEDURE ROOM [facility];  Service: Gastroenterology    PR UPPER GI ENDOSCOPY,BIOPSY N/A 03/13/2020    Procedure: UGI ENDOSCOPY; WITH BIOPSY, SINGLE OR MULTIPLE;  Surgeon: Helyn Numbers, MD;  [facility] [place] [facility];  Service: Gastroenterology    PR UPPER GI ENDOSCOPY,BIOPSY N/A 09/05/2021    Procedure: UGI ENDOSCOPY; WITH BIOPSY, SINGLE OR MULTIPLE;  Surgeon: Wendall Papa, MD;  Location: GI PROCEDURES [facility];  Service: Gastroenterology    PR UPPER GI ENDOSCOPY,DIAGNOSIS N/A 01/20/2022    Procedure: UGI ENDO, INCLUDE ESOPHAGUS, STOMACH, &  DUODENUM &/OR JEJUNUM; DX W/WO COLLECTION SPECIMN, BY BRUSH OR WASH;  Surgeon: Andrey Farmer, MD;  Location: GI PROCEDURES [facility];  Service: Gastroenterology    TUMOR REMOVAL      multiple-head, neck, back, hand, right flank, multiple           Family History    Family History   Problem Relation Age of Onset    No Known Problems Mother     No Known Problems Father     No Known Problems Sister     No Known Problems Brother     Stroke Maternal Grandmother     Other Maternal Grandmother         benign lesions of liver and pancreas, further details unknown    Cancer Maternal Grandmother     Diabetes Maternal Grandmother     Hypertension Maternal Grandmother     Thyroid disease Maternal Grandmother     Arthritis Maternal Grandfather     Asthma Maternal Grandfather     COPD Paternal Grandmother         Deceased    Miscarriages / Stillbirths Paternal Grandmother     Alcohol abuse Paternal Grandfather         Deceased    No Known Problems Maternal Aunt     No Known Problems Maternal Uncle     No Known Problems Paternal Aunt     No Known Problems Paternal Uncle     Anesthesia problems Neg Hx     Broken bones Neg Hx     Cancer Neg Hx     Clotting disorder Neg Hx     Collagen disease Neg Hx     Diabetes Neg Hx     Dislocations Neg Hx     Fibromyalgia Neg Hx     Gout Neg Hx     Hemophilia Neg Hx     Osteoporosis Neg Hx     Rheumatologic disease Neg Hx     Scoliosis Neg Hx     Severe sprains Neg Hx     Sickle cell anemia Neg Hx     Spinal Compression Fracture Neg Hx     Melanoma Neg Hx     Basal cell carcinoma Neg Hx     Squamous cell carcinoma Neg Hx          Social History:  Social History     Tobacco Use   Smoking Status Never    Passive exposure: Past   Smokeless Tobacco Never     Social History     Substance and Sexual Activity   Alcohol Use Never     Social History     Substance and Sexual Activity   Drug Use Never         Review of Systems    A 12 system review of systems was negative except as noted in HPI    Pertinent Imaging/Labs:  None    Lab Results   Component Value Date    CREATININE 0.46 (L) 05/01/2023       Lab Results   Component Value Date    ALKPHOS 120 (H) 04/10/2023    BILITOT 0.2 (L) 04/10/2023    BILIDIR <0.10 03/17/2023    PROT 7.0 04/10/2023    ALBUMIN 3.7 04/10/2023    ALT 34 04/10/2023    AST 20 04/10/2023       Encounter Date: 03/25/23   ECG 12 Lead   Result Value    EKG Systolic BP  EKG Diastolic BP     EKG Ventricular Rate 96    EKG Atrial Rate 96    EKG P-R Interval 134    EKG QRS Duration 78    EKG Q-T Interval 364    EKG QTC Calculation 459    EKG Calculated P Axis 36    EKG Calculated R Axis 40    EKG Calculated T Axis 10    QTC Fredericia 425    Narrative    NORMAL SINUS RHYTHM  NONSPECIFIC T WAVE ABNORMALITY  ABNORMAL ECG  WHEN COMPARED WITH ECG OF 28-Mar-2023 12:13,  NO SIGNIFICANT CHANGE WAS FOUND  Confirmed by Eldred Manges [phone]) on 04/03/2023 6:13:01 PM       No results found for requested labs within last 30 days.       Objective:     Vital Signs  Temp:  [36.6 ??C (97.9 ??F)-37.1 ??C (98.8 ??F)] 36.6 ??C (97.9 ??F)  Heart Rate:  [72-94] 83  Resp:  [16-18] 16  BP: (104-130)/(56-80) 104/80  MAP (mmHg):  [70-94] 88  SpO2:  [96 %-98 %] 98 %  BMI (Calculated):  [20.51] 20.51    Physical Exam    GENERAL:  Well developed, well-nourished female and is in moderate distress.   HEAD/NECK:    Reveals normocephalic/atraumatic.   CARDIOVASCULAR:   RRR, no murmur, Warm, well perfused, Regular rate, and Pulses equal  LUNGS:   Normal work of breathing, no supplemental 02  EXTREMITIES:  Warm, no clubbing, cyanosis, or edema was noted.  NEUROLOGIC:    The patient was alert and oriented times four with normal language, attention, cognition and memory. Cranial nerve exam was grossly normal.  MUSCULOSKELETAL:    Motor function  5/5 in upper and lower extremities.    SKIN:  No obvious rashes lesions or erythema  PSY:  Appropriate affect and mood.      Problem List    Principal Problem:    C. difficile diarrhea  Active Problems:    [name]    Acute deep vein thrombosis (DVT) of axillary vein of right upper extremity (CMS-HCC)    Severe protein-calorie malnutrition (CMS-HCC)    Lower abdominal pain    Generalized anxiety disorder with panic attacks    MDD (major depressive disorder)     Jessica Priest, MD  Anesthesiology PGY-1    I saw and evaluated the patient, participating in the key portions of the service.  I reviewed the resident's note and agree with the findings and plan.      Nat Christen, MD  Assistant Professor of Anesthesiology  Department of Anesthesiology   Divisions of General Anesthesiology and Pain Medicine    Medical Decision Making    Problems Addressed:  1 or more chronic illnesses with severe exacerbation, progression, or side effects of treatment (high)    Amount/Complexity:  Reviewed external records: Primary team progress note  Reviewed results: 2/24 CBC, 2/24 BMP  Ordered tests: none  Assessment requiring independent historian: none  Discussion of management/test results with medical professional: Primary team  Independent interpretation of test: none    Risk  High: Drug therapy requiring intensive monitoring for toxicity, Decision regarding hospitalization, Elective Major Surgery w/ risk factors, Emergency Major Surgery, Decision to DNR or de-escalate care because of poor prognosis, parental controlled substances (IV opioids)

## 2023-05-04 NOTE — Unmapped (Addendum)
 [facility])   Consultation - Liaison (CL) Psychiatry  CL Psychology Follow-Up Note      Service Date:  05/04/23  Admit Date:  05/01/23  Clinician:  Arlana Pouch, PsyD  Intervention: 60 min Individual Psychotherapy  Face-to-Face Clinical Contact with Patient: 55 min  Method of Interaction: In-Person    BACKGROUND INFORMATION AND REASON FOR REFERRAL:   Please see H&P and other recent records for full details. 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 She was discharged to [facility] on 2/21 and transferred back to [facility] on 2/22 for management of C. Diff. CL Psychology was initially engaged for psychological evaluation and psychotherapy to address mood concerns.     Upon initial evaluation, the patient endorsed recurrent episodes depressive symptoms, including depressed mood, hopelessness, and low self-worth that began when she was approximately 23yo in the context of health-related and interpersonal stressors that is consistent with major depressive disorder. She also endorsed lifelong anxiety about numerous domains that is consistent with generalized anxiety disorder. Additionally, Pt endorsed history of significant medical trauma and associated recurrent nightmares, flashbacks, dissociative episodes, hypervigilance,and avoidance of trauma-related reminders that is concerning for PTSD. Pt is amenable to working with Psychology on techniques to minimize anxiety that may be interfering with care progression during admission.     The patient was seen today for follow-up psychotherapy session to address anxiety.    Interval events:  -Pt admitted to [facility] from 2/1-2/21. She was transferred back to [facility] for management of C. Diff.    ASSESSMENT  IMPRESSIONS/SUMMARY    At follow-up, Pt's mood appeared overall stable. She voiced heightened stress due to medical and interpersonal concerns that appear reasonable and appropriate to circumstances. Pt amenable to follow-up with Psychology during admission to optimize coping with stressors.     DIAGNOSTIC IMPRESSIONS    Major depressive disorder, recurrent episode, moderate  Generalized anxiety disorder  Posttraumatic stress disorder    Risk Assessment:  ASQ screening result: not completed    -A full risk assessment was previously performed on 03/31/23.  Risk assessment remains essentially unchanged.    Current suicide risk: low risk  Current homicide risk: low risk      PLAN  RECOMMENDATIONS    ## Safety and Observation Level:   -This patient is not currently under IVC. If safety concerns arise, please page psychiatry for an evaluation. Recommend routine level of observation per primary team.    ## Follow-up:  - The patient desires ongoing follow-up from CL Psychology while medically inpatient.  - The patient is being followed by Psychiatry.    ## Disposition:  -There are no psychological contraindications to discharging this patient when medically appropriate.   - When this patient is discharged, please ensure that their AVS includes information about the 40 Suicide & Crisis Lifeline.      SUBJECTIVE  SESSION CONTENT     Session began with a brief check-in. Pt recounted salient events that occurred since last session. She shared positive experiences at [facility], noting she was feeling much better and progressing with PT. Pt noted that prior to C. Diff diagnosis, she experienced acute worsening of symptoms that were initially dismissed as anxiety. Pt shared that she was very upset to learn she had C.Diff, however, she noted that she was glad to have  her concerns addressed. Discussed mindfulness-based coping techniques for Pt to cope with medical stressors and prevent introjection.  Pt discussed other interpersonal stressor that occurred simultaneously. Session truncated to allow Pt to work with nursing staff. Pt amenable to continue following with clinician during admission to develop additional coping strategies to cope with psychosocial stressors.       OBJECTIVE   MENTAL STATUS     Orientation and consciousness: AOx4   Appearance: Appears stated age  Behavior: Calm, Cooperative, Direct eye contact, and Polite  Speech: Within normal limits  Language: Intact  Mood:  Neutral  Affect: Full and appropriate to discussion content  Perceptual disturbance (hallucinations, illusions): None  Thought process and association: Linear and coherent  Thought content (delusions, obsessions etc.): None  Insight: Intact  Judgement: Intact  Memory: WNL, although not formally assessed     EDUCATION/INTERVENTIONS:    -Completed mood check-in and reviewed the patient's current stressors and strategies to manage stress.  -Cognitive behavioral therapy  -Supportive interventions   -Discussed the patient's treatment goals, revisited discussion of treatment options, and  explored goals of care.  -Continued to discuss pertinent findings with Pt's care team

## 2023-05-04 NOTE — Unmapped (Signed)
 Overnight pt afebrile, all VSS. Tube feeds cycled until 6 AM via [place], per orders. Multiple pain prns given, see MAR. Pt did complain of hives and reported it possibly being related to oral Vanc. MD notified. IV Benadryl ordered and given with improvement of symptoms. Pt with mother at bedside, will continue to monitor.

## 2023-05-05 MED ADMIN — lidocaine (ASPERCREME) 4 % 1 patch: 1 | TRANSDERMAL | @ 14:00:00

## 2023-05-05 MED ADMIN — baclofen (LIORESAL) tablet 5 mg: 5 mg | ORAL | @ 14:00:00

## 2023-05-05 MED ADMIN — HYDROmorphone (PF) (DILAUDID) injection 1 mg: 1 mg | INTRAVENOUS | @ 13:00:00 | Stop: 2023-05-07

## 2023-05-05 MED ADMIN — famotidine (PEPCID) oral suspension: 20 mg | ENTERAL | @ 03:00:00

## 2023-05-05 MED ADMIN — oxyCODONE (ROXICODONE) 5 mg/5 mL solution 20 mg: 20 mg | ORAL | @ 10:00:00 | Stop: 2023-05-07

## 2023-05-05 MED ADMIN — ondansetron (ZOFRAN) injection 8 mg: 8 mg | INTRAVENOUS | @ 18:00:00

## 2023-05-05 MED ADMIN — promethazine (PHENERGAN) tablet 12.5 mg: 12.5 mg | ORAL | @ 14:00:00

## 2023-05-05 MED ADMIN — promethazine (PHENERGAN) tablet 12.5 mg: 12.5 mg | ORAL | @ 03:00:00

## 2023-05-05 MED ADMIN — vancomycin (FIRVANQ) (50 mg/mL) oral solution: 125 mg | ORAL | @ 08:00:00 | Stop: 2023-05-13

## 2023-05-05 MED ADMIN — HYDROmorphone (PF) (DILAUDID) injection 1 mg: 1 mg | INTRAVENOUS | @ 23:00:00 | Stop: 2023-05-07

## 2023-05-05 MED ADMIN — escitalopram oxalate (LEXAPRO) tablet 5 mg: 5 mg | ENTERAL | @ 14:00:00

## 2023-05-05 MED ADMIN — oxyCODONE (ROXICODONE) 5 mg/5 mL solution 20 mg: 20 mg | ORAL | @ 20:00:00 | Stop: 2023-05-07

## 2023-05-05 MED ADMIN — oxyCODONE (ROXICODONE) 5 mg/5 mL solution 20 mg: 20 mg | ORAL | @ 03:00:00 | Stop: 2023-05-07

## 2023-05-05 MED ADMIN — vancomycin (FIRVANQ) (50 mg/mL) oral solution: 125 mg | ORAL | @ 03:00:00 | Stop: 2023-05-13

## 2023-05-05 MED ADMIN — vancomycin (FIRVANQ) (50 mg/mL) oral solution: 125 mg | ORAL | @ 14:00:00 | Stop: 2023-05-13

## 2023-05-05 MED ADMIN — ammonium lactate (LAC-HYDRIN) 12 % lotion 1 Application: 1 | TOPICAL | @ 14:00:00

## 2023-05-05 MED ADMIN — apixaban (ELIQUIS) tablet 5 mg: 5 mg | ORAL | @ 14:00:00

## 2023-05-05 MED ADMIN — oxyCODONE (ROXICODONE) 5 mg/5 mL solution 20 mg: 20 mg | ORAL | @ 14:00:00 | Stop: 2023-05-07

## 2023-05-05 MED ADMIN — acetaminophen (TYLENOL) oral liquid: 1000 mg | ENTERAL | @ 10:00:00

## 2023-05-05 MED ADMIN — vancomycin (FIRVANQ) (50 mg/mL) oral solution: 125 mg | ORAL | @ 21:00:00 | Stop: 2023-05-13

## 2023-05-05 MED ADMIN — acetaminophen (TYLENOL) oral liquid: 1000 mg | ENTERAL | @ 20:00:00

## 2023-05-05 MED ADMIN — LORazepam (ATIVAN) tablet 1 mg: 1 mg | ORAL | @ 21:00:00

## 2023-05-05 MED ADMIN — HYDROmorphone (PF) (DILAUDID) injection 1 mg: 1 mg | INTRAVENOUS | @ 01:00:00 | Stop: 2023-05-07

## 2023-05-05 MED ADMIN — promethazine (PHENERGAN) tablet 12.5 mg: 12.5 mg | ORAL | @ 23:00:00

## 2023-05-05 MED ADMIN — apixaban (ELIQUIS) tablet 5 mg: 5 mg | ORAL | @ 03:00:00

## 2023-05-05 MED ADMIN — cefdinir (OMNICEF) oral suspension: 300 mg | ORAL | @ 14:00:00 | Stop: 2023-05-05

## 2023-05-05 MED ADMIN — ammonium lactate (LAC-HYDRIN) 12 % lotion 1 Application: 1 | TOPICAL | @ 03:00:00

## 2023-05-05 MED ADMIN — pantoprazole (Protonix) injection 40 mg: 40 mg | INTRAVENOUS | @ 03:00:00

## 2023-05-05 MED ADMIN — HYDROmorphone (PF) (DILAUDID) injection 1 mg: 1 mg | INTRAVENOUS | @ 18:00:00 | Stop: 2023-05-07

## 2023-05-05 MED ADMIN — baclofen (LIORESAL) tablet 5 mg: 5 mg | ORAL | @ 20:00:00

## 2023-05-05 MED ADMIN — baclofen (LIORESAL) tablet 5 mg: 5 mg | ORAL | @ 03:00:00

## 2023-05-05 MED ADMIN — pantoprazole (Protonix) oral suspension: 40 mg | ORAL | @ 14:00:00

## 2023-05-05 MED ADMIN — acetaminophen (TYLENOL) oral liquid: 1000 mg | ENTERAL | @ 03:00:00

## 2023-05-05 MED ADMIN — HYDROmorphone (PF) (DILAUDID) injection 1 mg: 1 mg | INTRAVENOUS | @ 08:00:00 | Stop: 2023-05-07

## 2023-05-05 MED ADMIN — promethazine (PHENERGAN) tablet 12.5 mg: 12.5 mg | ORAL | @ 08:00:00

## 2023-05-05 NOTE — Unmapped (Signed)
 [facility])   Consultation - Liaison (CL) Psychiatry  CL Psychology Follow-Up Note      Service Date:  05/05/23  Admit Date:  05/01/23  Clinician:  Arlana Pouch, PsyD  Intervention: 60 min Individual Psychotherapy  Face-to-Face Clinical Contact with Patient: 60 min  Method of Interaction: In-Person    BACKGROUND INFORMATION AND REASON FOR REFERRAL:   Please see H&P and other recent records for full details. 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 She was discharged to [facility] on 2/21 and transferred back to [facility] on 2/22 for management of C. Diff. CL Psychology was initially engaged for psychological evaluation and psychotherapy to address mood concerns.     Upon initial evaluation, the patient endorsed recurrent episodes depressive symptoms, including depressed mood, hopelessness, and low self-worth that began when she was approximately 23yo in the context of health-related and interpersonal stressors that is consistent with major depressive disorder. She also endorsed lifelong anxiety about numerous domains that is consistent with generalized anxiety disorder. Additionally, Pt endorsed history of significant medical trauma and associated recurrent nightmares, flashbacks, dissociative episodes, hypervigilance,and avoidance of trauma-related reminders that is concerning for PTSD. Pt is amenable to working with Psychology on techniques to minimize anxiety that may be interfering with care progression during admission.     The patient was seen today for follow-up psychotherapy session to address anxiety.    Interval events:  -No adverse behavioral/psychiatric events since last seen by Psychology.    ASSESSMENT  IMPRESSIONS/SUMMARY    At follow-up, Pt endorsed ongoing physiological arousal and negative alterations in cognitions in the setting of trauma history. Reviewed grounding and relaxation techniques Pt can use to cope with trauma symptoms. Pt actively engaged and receptive to feedback.     DIAGNOSTIC IMPRESSIONS    Major depressive disorder, recurrent episode, moderate  Generalized anxiety disorder  Posttraumatic stress disorder    Risk Assessment:  ASQ screening result: not completed    -A full risk assessment was previously performed on 03/31/23.  Risk assessment remains essentially unchanged.    Current suicide risk: low risk  Current homicide risk: low risk      PLAN  RECOMMENDATIONS    ## Safety and Observation Level:   -This patient is not currently under IVC. If safety concerns arise, please page psychiatry for an evaluation. Recommend routine level of observation per primary team.    ## Follow-up:  - The patient desires ongoing follow-up from CL Psychology while medically inpatient.  - The patient is being followed by Psychiatry.    ## Disposition:  -There are no psychological contraindications to discharging this patient when medically appropriate.   - When this patient is discharged, please ensure that their AVS includes information about the 37 Suicide & Crisis Lifeline.      SUBJECTIVE  SESSION CONTENT     Session began with a brief check-in. Pt discussed triggers for posttraumatic symptoms and shared how triggers impact her physiological arousal and perceptions of safety. Discussed alternate perspectives and reviewed grounding and relaxation techniques that help Pt cultivate a sense of safety in the hospital environment.     OBJECTIVE   MENTAL STATUS     Orientation and consciousness: AOx4   Appearance: Appears stated age  Behavior: Calm, Cooperative, Direct eye contact, and Polite  Speech: Within normal  limits  Language: Intact  Mood:  Neutral  Affect: Full and appropriate to discussion content  Perceptual disturbance (hallucinations, illusions): None  Thought process and association: Linear and coherent  Thought content (delusions, obsessions etc.): None  Insight: Intact  Judgement: Intact  Memory: WNL, although not formally assessed     EDUCATION/INTERVENTIONS:    -Completed mood check-in and reviewed the patient's current stressors and strategies to manage stress.  -Cognitive behavioral therapy  -Discussed the patient's treatment goals, revisited discussion of treatment options, and  explored goals of care.  -Continued to discuss pertinent findings with Pt's care team

## 2023-05-05 NOTE — Unmapped (Signed)
 Oncology (MEDO) Progress Note    Assessment & Plan:   [name] [name] is a 23 y.o. female whose presentation is complicated by [name] c/b desmoid fibromatosis, recurrent ileus, GI bleeds, DVT, MDD, and GAD that presented to [facility] with C diff infection.     Principal Problem:    C. difficile diarrhea  Active Problems:    [name]    Acute deep vein thrombosis (DVT) of axillary vein of right upper extremity (CMS-HCC)    Severe protein-calorie malnutrition (CMS-HCC)    Lower abdominal pain    Generalized anxiety disorder with panic attacks    MDD (major depressive disorder)    Active Problems    C Difficile Infection  Patient diagnosed with C diff via stool sample collected on 2/19. Started on PO Vancomycin on that date with plans for 14 day course. Since starting the Vancomycin, diarrhea has started to improve.  -Continue PO Vancomycin, plan for 14d course (2/19-3/5)    Morbiliform rash  Likely 2/2 PO vancomycin.Refuses PO benadryl and PO atarax. Thus, we are compounding IV benadryl into a bug of IVF to infuse in order to prevent other unwanted side effects.      Acute on chronic abdominal pain and nausea  Complex situation discussed at length in prior notes. In brief, her pain and nausea are (deeply) multifactorial but consensus among treatment team is that new / unknown organic issue is unlikely. Plan to focus on symptom control, adequate nutrition.  Patient is currently at goal tube feeding, tolerating well.  No visible emesis noted.  Still reports persistent nausea but on aggressive nausea management as below. Will need to consult chronic pain service here at [facility] on monday AM when [name] is attending in service.   -NO CHANGES to pain regimen without multiparty discussion including patient, unless true emergency.  -Pain management (continued from [facility]): Tylenol 1g q8h scheduled, Buprenorphine patch 60mcg/hr weekly, Oxycodone 20 mg q4h PRN, Dilaudid 1 mg q4h PRN (don't give dilaudid and oxy within 1 hour of each other)  -Spasticity (continued from [facility]): Baclofen 5mg  TID (started in rehab). We discussed valium but never used this for spasticity while inpatient as she has a contract with outpatient pain provider  -Nausea (continued from [facility]): Continue IV zofran PRN, PO Phenergan q6h scheduled. Used IV Phenergan at [facility], but was transitioned to oral 2/17. Also used scopolamine. She refused the zyprexa nightly.   -Dyspepsia (continued from [facility]): Pepcid BID, Protonix BID  -GI motility (continued from [facility]): used simethicone TID PRN and naloxegol PRN  -Chronic pain consult on 2/23 AM  -GI consult in AM     DVT of LUE  Had LUE DVT diagnosed via PVL on 04/06/2023 associated with PICC line. Has been on Eliquis since that time.   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 [name]. Patient has numerous sites of disease but most bothersome has been mesenteric sites which have caused chronic abdominal pain, nausea/vomiting. Please see Dr. Brynda Rim note from 1/14 addended 1/15 with his very thoughtful assessment of this situation and current hospitalization. AIR had continued home nirogacestat as tolerated. Patient tried holding it for a few days to see if it helped abdominal symptoms. okay to hold nirogesat while having difficulty with oral meds, no urgency to resume per Dr [name] .     Chronic Problems  GAD - MDD  -Continue Lexapro    Issues Impacting Complexity of Management:  -Discussion of risks/benefits of Acute on chronic abdominal pain, desmoid fibromatosis with the patient/family including risk of risk factor: infection, medical complications, and urinary retention.  -Parenteral controlled medications: IV Dilaudid    Medical Decision Making: Reviewed records from the following unique sources  Admission notes, and speciality notes.    Daily Checklist:  Diet: Tube Feeds  DVT PPx: Patient Already on Full Anticoagulation with Eliquis  Electrolytes: Replete Potassium to >/=4 and Magnesium to >/=2  Code Status: Full Code  Dispo:  pending return to baseline stool function and pain    Team Contact Information:   Primary Team: Oncology (MEDO)  Primary Resident: Oneal Grout, MD, MD  Resident's Pager: [phone] (Oncology Senior Resident)    Interval History:   No acute events overnight.  Pt stable, still reporting multiple bowel movements per day. Was speaking with psychologist while I rounded on her.     Requesting more medications in IV form.    Objective:   Temp:  [36.6 ??C (97.9 ??F)-36.8 ??C (98.2 ??F)] 36.8 ??C (98.2 ??F)  Heart Rate:  [69-89] 78  Resp:  [16-18] 16  BP: (104-134)/(53-86) 118/80  SpO2:  [94 %-99 %] 96 %,   Intake/Output Summary (Last 24 hours) at 05/05/2023 0700  Last data filed at 05/05/2023 0446  Gross per 24 hour   Intake 350 ml   Output --   Net 350 ml   , I/O this shift:  In: 350 [I.V.:30; NG/GT:320]  Out: - ,  ,  , Body mass index is 20.51 kg/m??.,   Wt Readings from Last 3 Encounters:   05/03/23 55.9 kg (123 lb 3.8 oz)   04/06/23 54.4 kg (120 lb)   03/24/23 52.8 kg (116 lb 6.4 oz)     Gen: Chronically ill female laying in bed with NG tube in place  HENT: atraumatic, normocephalic  Heart: RRR  Lungs: CTAB, no crackles or wheezes  Abdomen: soft, NTND  Extremities: No edema

## 2023-05-05 NOTE — Unmapped (Signed)
 Pt AxOx4, up w/ SBA to bathroom, utilizing walker for longer distances. Mom at bedside, assisting with care. Continuous tube feeds overnight. No c/o nausea. Ongoing abdominal pain. PRN oxy given x2, PRN IV dilaudid given x2. Generous flushing w/ medication administration. PIVs intact w/out blood return. Enteric precautions maintained.     Problem: Adult Inpatient Plan of Care  Goal: Plan of Care Review  Outcome: Ongoing - Unchanged  Goal: Patient-Specific Goal (Individualized)  Outcome: Ongoing - Unchanged  Goal: Absence of Hospital-Acquired Illness or Injury  Outcome: Ongoing - Unchanged  Intervention: Identify and Manage Fall Risk  Recent Flowsheet Documentation  Taken 05/04/2023 1950 by Azzie Almas, RN  Safety Interventions:   family at bedside   low bed   lighting adjusted for tasks/safety   nonskid shoes/slippers when out of bed   isolation precautions   infection management  Intervention: Prevent Skin Injury  Recent Flowsheet Documentation  Taken 05/04/2023 1950 by Azzie Almas, RN  Positioning for Skin: Supine/Back  Device Skin Pressure Protection: absorbent pad utilized/changed  Skin Protection: adhesive use limited  Intervention: Prevent and Manage VTE (Venous Thromboembolism) Risk  Recent Flowsheet Documentation  Taken 05/04/2023 1950 by Azzie Almas, RN  Anti-Embolism Device Status: (PO eliquis) Other (Comment)  Intervention: Prevent Infection  Recent Flowsheet Documentation  Taken 05/04/2023 1950 by Azzie Almas, RN  Infection Prevention:   cohorting utilized   hand hygiene promoted  Goal: Optimal Comfort and Wellbeing  Outcome: Ongoing - Unchanged  Goal: Readiness for Transition of Care  Outcome: Ongoing - Unchanged  Goal: Rounds/Family Conference  Outcome: Ongoing - Unchanged     Problem: Latex Allergy  Goal: Absence of Allergy Symptoms  Outcome: Ongoing - Unchanged     Problem: Infection  Goal: Absence of Infection Signs and Symptoms  Outcome: Ongoing - Unchanged  Intervention: Prevent or Manage Infection  Recent Flowsheet Documentation  Taken 05/04/2023 1950 by Azzie Almas, RN  Infection Management: aseptic technique maintained  Isolation Precautions: enteric precautions maintained     Problem: Self-Care Deficit  Goal: Improved Ability to Complete Activities of Daily Living  Outcome: Ongoing - Unchanged     Problem: Fall Injury Risk  Goal: Absence of Fall and Fall-Related Injury  Outcome: Ongoing - Unchanged  Intervention: Promote Injury-Free Environment  Recent Flowsheet Documentation  Taken 05/04/2023 1950 by Azzie Almas, RN  Safety Interventions:   family at bedside   low bed   lighting adjusted for tasks/safety   nonskid shoes/slippers when out of bed   isolation precautions   infection management     Problem: Malnutrition  Goal: Improved Nutritional Intake  Outcome: Ongoing - Unchanged

## 2023-05-06 DIAGNOSIS — D48119 Desmoid tumor: Principal | ICD-10-CM

## 2023-05-06 LAB — IRON PANEL
IRON SATURATION: 13 % — ABNORMAL LOW (ref 20–55)
IRON: 35 ug/dL — ABNORMAL LOW (ref 50–170)
TOTAL IRON BINDING CAPACITY: 279 ug/dL (ref 250–425)

## 2023-05-06 LAB — BASIC METABOLIC PANEL
ANION GAP: 13 mmol/L (ref 5–14)
BLOOD UREA NITROGEN: 11 mg/dL (ref 9–23)
BUN / CREAT RATIO: 22
CALCIUM: 9.9 mg/dL (ref 8.7–10.4)
CHLORIDE: 98 mmol/L (ref 98–107)
CO2: 29 mmol/L (ref 20.0–31.0)
CREATININE: 0.51 mg/dL — ABNORMAL LOW (ref 0.55–1.02)
EGFR CKD-EPI (2021) FEMALE: >90 mL/min/{1.73_m2} (ref >=60)
GLUCOSE RANDOM: 88 mg/dL (ref 70–179)
POTASSIUM: 4.1 mmol/L (ref 3.4–4.8)
SODIUM: 140 mmol/L (ref 135–145)

## 2023-05-06 LAB — CBC
HEMATOCRIT: 36 % (ref 34.0–44.0)
HEMOGLOBIN: 12.3 g/dL (ref 11.3–14.9)
MEAN CORPUSCULAR HEMOGLOBIN CONC: 34 g/dL (ref 32.0–36.0)
MEAN CORPUSCULAR HEMOGLOBIN: 29.2 pg (ref 25.9–32.4)
MEAN CORPUSCULAR VOLUME: 85.9 fL (ref 77.6–95.7)
MEAN PLATELET VOLUME: 9.8 fL (ref 6.8–10.7)
PLATELET COUNT: 201 10*9/L (ref 150–450)
RED BLOOD CELL COUNT: 4.19 10*12/L (ref 3.95–5.13)
RED CELL DISTRIBUTION WIDTH: 16.4 % — ABNORMAL HIGH (ref 12.2–15.2)
WBC ADJUSTED: 9.3 10*9/L (ref 3.6–11.2)

## 2023-05-06 LAB — MAGNESIUM: MAGNESIUM: 2.1 mg/dL (ref 1.6–2.6)

## 2023-05-06 LAB — FERRITIN: FERRITIN: 70.7 ng/mL (ref 7.3–270.7)

## 2023-05-06 MED ADMIN — oxyCODONE (ROXICODONE) 5 mg/5 mL solution 20 mg: 20 mg | ORAL | @ 11:00:00 | Stop: 2023-05-10

## 2023-05-06 MED ADMIN — famotidine (PEPCID) oral suspension: 20 mg | ENTERAL | @ 01:00:00

## 2023-05-06 MED ADMIN — HYDROmorphone (PF) (DILAUDID) injection 1 mg: 1 mg | INTRAVENOUS | @ 14:00:00 | Stop: 2023-05-07

## 2023-05-06 MED ADMIN — vancomycin (FIRVANQ) (50 mg/mL) oral solution: 125 mg | ORAL | @ 14:00:00 | Stop: 2023-05-13

## 2023-05-06 MED ADMIN — HYDROmorphone (PF) (DILAUDID) injection 1 mg: 1 mg | INTRAVENOUS | @ 23:00:00 | Stop: 2023-05-07

## 2023-05-06 MED ADMIN — oxyCODONE (ROXICODONE) 5 mg/5 mL solution 20 mg: 20 mg | ORAL | @ 21:00:00 | Stop: 2023-05-10

## 2023-05-06 MED ADMIN — oxyCODONE (ROXICODONE) 5 mg/5 mL solution 20 mg: 20 mg | ORAL | @ 05:00:00 | Stop: 2023-05-10

## 2023-05-06 MED ADMIN — promethazine (PHENERGAN) tablet 12.5 mg: 12.5 mg | ORAL | @ 08:00:00

## 2023-05-06 MED ADMIN — promethazine (PHENERGAN) tablet 12.5 mg: 12.5 mg | ORAL | @ 16:00:00

## 2023-05-06 MED ADMIN — baclofen (LIORESAL) tablet 5 mg: 5 mg | ORAL | @ 01:00:00

## 2023-05-06 MED ADMIN — HYDROmorphone (PF) (DILAUDID) injection 1 mg: 1 mg | INTRAVENOUS | @ 08:00:00 | Stop: 2023-05-07

## 2023-05-06 MED ADMIN — baclofen (LIORESAL) tablet 5 mg: 5 mg | ORAL | @ 14:00:00

## 2023-05-06 MED ADMIN — acetaminophen (TYLENOL) oral liquid: 1000 mg | ENTERAL | @ 19:00:00

## 2023-05-06 MED ADMIN — HYDROmorphone (PF) (DILAUDID) injection 1 mg: 1 mg | INTRAVENOUS | @ 03:00:00 | Stop: 2023-05-07

## 2023-05-06 MED ADMIN — escitalopram oxalate (LEXAPRO) tablet 5 mg: 5 mg | ENTERAL | @ 14:00:00

## 2023-05-06 MED ADMIN — oxyCODONE (ROXICODONE) 5 mg/5 mL solution 20 mg: 20 mg | ORAL | @ 01:00:00 | Stop: 2023-05-07

## 2023-05-06 MED ADMIN — promethazine (PHENERGAN) tablet 12.5 mg: 12.5 mg | ORAL | @ 21:00:00

## 2023-05-06 MED ADMIN — oxyCODONE (ROXICODONE) 5 mg/5 mL solution 20 mg: 20 mg | ORAL | @ 15:00:00 | Stop: 2023-05-10

## 2023-05-06 MED ADMIN — LORazepam (ATIVAN) tablet 1 mg: 1 mg | ORAL | @ 21:00:00

## 2023-05-06 MED ADMIN — acetaminophen (TYLENOL) oral liquid: 1000 mg | ENTERAL | @ 11:00:00

## 2023-05-06 MED ADMIN — pantoprazole (Protonix) oral suspension: 40 mg | ORAL | @ 14:00:00

## 2023-05-06 MED ADMIN — apixaban (ELIQUIS) tablet 5 mg: 5 mg | ORAL | @ 01:00:00

## 2023-05-06 MED ADMIN — ondansetron (ZOFRAN) injection 8 mg: 8 mg | INTRAVENOUS | @ 23:00:00

## 2023-05-06 MED ADMIN — promethazine (PHENERGAN) tablet 12.5 mg: 12.5 mg | ORAL | @ 03:00:00

## 2023-05-06 MED ADMIN — pantoprazole (Protonix) oral suspension: 40 mg | ORAL | @ 01:00:00

## 2023-05-06 MED ADMIN — vancomycin (FIRVANQ) (50 mg/mL) oral solution: 125 mg | ORAL | @ 19:00:00 | Stop: 2023-05-13

## 2023-05-06 MED ADMIN — apixaban (ELIQUIS) tablet 5 mg: 5 mg | ORAL | @ 14:00:00

## 2023-05-06 MED ADMIN — ammonium lactate (LAC-HYDRIN) 12 % lotion 1 Application: 1 | TOPICAL | @ 15:00:00

## 2023-05-06 MED ADMIN — acetaminophen (TYLENOL) oral liquid: 1000 mg | ENTERAL | @ 03:00:00

## 2023-05-06 MED ADMIN — vancomycin (FIRVANQ) (50 mg/mL) oral solution: 125 mg | ORAL | @ 08:00:00 | Stop: 2023-05-13

## 2023-05-06 MED ADMIN — vancomycin (FIRVANQ) (50 mg/mL) oral solution: 125 mg | ORAL | @ 01:00:00 | Stop: 2023-05-13

## 2023-05-06 MED ADMIN — baclofen (LIORESAL) tablet 5 mg: 5 mg | ORAL | @ 19:00:00

## 2023-05-06 MED ADMIN — lidocaine (ASPERCREME) 4 % 1 patch: 1 | TRANSDERMAL | @ 15:00:00

## 2023-05-06 MED ADMIN — HYDROmorphone (PF) (DILAUDID) injection 1 mg: 1 mg | INTRAVENOUS | @ 19:00:00 | Stop: 2023-05-07

## 2023-05-06 NOTE — Unmapped (Signed)
 [name] [name] AxOx4, Mom at bedside overnight. Enteric precautions maintained. TF running at 40 mL/hr; 75mL flush q3h. Ongoing severe pain. PRN oxy given x3, PRN IV dilaudid given x2. Some itching, [name] [name] tolerating symptom. L hand PIV w/ old drainage, flushing well. R wrist PIV flushing w/out blood return. [name] [name] SBA to bathroom. Generous flushing w/ meds through [place]. [name] [name] resting intermittently overnight. IV tubing changed.     Problem: Adult Inpatient Plan of Care  Goal: Plan of Care Review  Outcome: Ongoing - Unchanged  Goal: Patient-Specific Goal (Individualized)  Outcome: Ongoing - Unchanged  Goal: Absence of Hospital-Acquired Illness or Injury  Outcome: Ongoing - Unchanged  Intervention: Identify and Manage Fall Risk  Recent Flowsheet Documentation  Taken 05/05/2023 1948 by Azzie Almas, RN  Safety Interventions:   family at bedside   low bed   lighting adjusted for tasks/safety   nonskid shoes/slippers when out of bed   isolation precautions  Intervention: Prevent Skin Injury  Recent Flowsheet Documentation  Taken 05/05/2023 1948 by Azzie Almas, RN  Positioning for Skin: Supine/Back  Device Skin Pressure Protection: absorbent pad utilized/changed  Skin Protection: adhesive use limited  Intervention: Prevent and Manage VTE (Venous Thromboembolism) Risk  Recent Flowsheet Documentation  Taken 05/05/2023 1948 by Azzie Almas, RN  Anti-Embolism Device Status: (PO eliquis) Other (Comment)  Intervention: Prevent Infection  Recent Flowsheet Documentation  Taken 05/05/2023 1948 by Azzie Almas, RN  Infection Prevention:   cohorting utilized   hand hygiene promoted  Goal: Optimal Comfort and Wellbeing  Outcome: Ongoing - Unchanged  Goal: Readiness for Transition of Care  Outcome: Ongoing - Unchanged  Goal: Rounds/Family Conference  Outcome: Ongoing - Unchanged     Problem: Latex Allergy  Goal: Absence of Allergy Symptoms  Outcome: Ongoing - Unchanged     Problem: Infection  Goal: Absence of Infection Signs and Symptoms  Outcome: Ongoing - Unchanged  Intervention: Prevent or Manage Infection  Recent Flowsheet Documentation  Taken 05/05/2023 1948 by Azzie Almas, RN  Infection Management: aseptic technique maintained  Isolation Precautions: enteric precautions maintained     Problem: Self-Care Deficit  Goal: Improved Ability to Complete Activities of Daily Living  Outcome: Ongoing - Unchanged     Problem: Fall Injury Risk  Goal: Absence of Fall and Fall-Related Injury  Outcome: Ongoing - Unchanged  Intervention: Promote Injury-Free Environment  Recent Flowsheet Documentation  Taken 05/05/2023 1948 by Azzie Almas, RN  Safety Interventions:   family at bedside   low bed   lighting adjusted for tasks/safety   nonskid shoes/slippers when out of bed   isolation precautions     Problem: Malnutrition  Goal: Improved Nutritional Intake  Outcome: Ongoing - Unchanged

## 2023-05-06 NOTE — Unmapped (Signed)
 Problem: Adult Inpatient Plan of Care  Goal: Plan of Care Review  Outcome: Ongoing - Unchanged  Goal: Patient-Specific Goal (Individualized)  Outcome: Ongoing - Unchanged  Goal: Absence of Hospital-Acquired Illness or Injury  Outcome: Ongoing - Unchanged  Intervention: Identify and Manage Fall Risk  Recent Flowsheet Documentation  Taken 05/06/2023 0745 by Minus Breeding, RN  Safety Interventions:   low bed   lighting adjusted for tasks/safety   family at bedside  Goal: Optimal Comfort and Wellbeing  Outcome: Ongoing - Unchanged  Goal: Readiness for Transition of Care  Outcome: Ongoing - Unchanged  Goal: Rounds/Family Conference  Outcome: Ongoing - Unchanged     Problem: Latex Allergy  Goal: Absence of Allergy Symptoms  Outcome: Ongoing - Unchanged     Problem: Infection  Goal: Absence of Infection Signs and Symptoms  Outcome: Ongoing - Unchanged     Problem: Self-Care Deficit  Goal: Improved Ability to Complete Activities of Daily Living  Outcome: Ongoing - Unchanged     Problem: Fall Injury Risk  Goal: Absence of Fall and Fall-Related Injury  Outcome: Ongoing - Unchanged  Intervention: Promote Injury-Free Environment  Recent Flowsheet Documentation  Taken 05/06/2023 0745 by Minus Breeding, RN  Safety Interventions:   low bed   lighting adjusted for tasks/safety   family at bedside     Problem: Malnutrition  Goal: Improved Nutritional Intake  Outcome: Ongoing - Unchanged

## 2023-05-06 NOTE — Unmapped (Signed)
 Department of Anesthesiology  Pain Medicine Division    Chronic Pain Followup Inpatient Consult Note    Requesting Attending Physician:  Ansel Bong, MD  Service Requesting Consult:  Oncology/Hematology (MDE)    Assessment/Recommendations:  The patient is a 23 y.o. female with history of [name] c/b desmoid fibromatosis, recurrent ileus, GI bleeds, DVT, MDD, and GAD that presented to [facility] with C diff infection currently on PO vancomycin therapy. Patient noted interval improvement in chronic abdominal pain associated with desmoid fibromatosis and decreased opioid use prior to recent pain flare exacerbated by C. Diff.     Please refer to 03/25/23 treatment plan note regarding patient's default pain plan for admissions     Her clinical picture is complicated by frequent admissions where pain control is a recurrent issue.  While she will often present with a bona fide organic insult that triggers acute flares of chronic abdominal pain, immediate escalations to treat the acute pain lead to positive feedback loops with deleterious effects.  Her clinical course often becomes complicated by narcotic bowel syndrome, visceral hyperalgesia, and generalized opioid-induced hyperalgesia that prolongs admission after the inciting, acute pathology resolves.     As a result, while it is very important to control her acute pain when she presents for admission, it is likewise important to avoid over-escalation of her therapy so as not to precipitate the sequelae of high-dose opioid exposure. Of note, during prolonged hospitalizations Aspasia has had multiple interventions trialed to optimize her pain control including utilization of IV lidocaine infusions and IV ketamine infusions.  She is rarely benefited from IV lidocaine infusions, though they can be utilized as third line intervention in the future.  She does tend to benefit from IV ketamine infusions but, per [facility] pain division policy, patients can only have 4 ketamine infusions per year.  Her last ketamine infusions were on the following dates: 08/01/22, 08/14/22, 10/09/22.  Therefore, [name] has 1 remaining ketamine infusion until 08/01/2023 and this last infusion should be reserved for the most severe of pain flares.     Interval: I was able to see [name] [name] early in the afternoon.  She notes that she has been experiencing worsening abdominal pain today in the setting of what she describes as blood in her stool and uterine bleeding.  We discussed that it was reassuring that her hemoglobin had in fact up trended today and noted that management of the situation is deferred to the primary team and there are discussions with other teams involved with her care.  We also discussed that, if everything looks reassuring tomorrow, it appears she could be discharged as soon as tomorrow.  She was very upset to hear this news and expressed extreme anxiety surrounding possible discharge.  I reassured her that she appears to be doing very well overall and would likely do well and discharged noting that there is no guarantee that things would deteriorate just as there is no guarantee that things would not deteriorate.  However, I noted that her experience in the hospital over the last year has been 1 fraught with multiple difficulties, setbacks, and iatrogenic complications and stated that, from my point of view, being at home might be in her best interest so as to avoid these iatrogenic, secondary issue such as line related bloodstream infections, C. difficile infections, DVTs, etc.  I expressed my sympathy for her current situation and the stress and anxiety surrounding it.  I also noted that she had seemingly responded very well to the previously agreed-upon maximum  dose pain regimen that has been active since Monday.  We discussed ways that we could approach getting her ready for discharge from a pain perspective including how to wean her IV.  I discussed with her that she could choose either to wean the dose of her IV hydromorphone but keep the frequency of the same or wean the frequency but keep the dose the same and she stated that she would let the primary team know what her final decision is.  Please see below for detailed discharge recommendations for when discharge comes.  I will arrange for close outpatient follow-up and further titrate her pain medications at that time.    Home MME: 89 (Oxycodone and Buprenorphine)  Current MME: 314 (of which 44 is from the Butrans patch)     Recommendations:  -The chronic pain service is a consult service and does not place orders, just makes recommendations (except ketamine and lidocaine infusions)  -Please evaluate all patients on opioids for appropriateness of prescribing narcan at discharge.  The chronic pain service can assist with this.  Nasal narcan covered by most insurance.  -Recommendations given apply to the current hospitalization and do not reflect long term recommendations.     Temesha Queener will inform team of her decision regarding her IV HM wean, either electing to decrease the IV HM dose when given (to 0.5 mg q4h PRN) or by spacing out interval (to 1 mg q6h PRN)  - Continue oxycodone 20 mg liquid q4h PRN  - Continue home Butrans patch 20 mcg/h  - Continue APAP 1000 mg TID  - Continue baclofen 5 mg TID  - Continue Lidoderm patches    Discharge planning:  - Wean off of IV HM allowing Orli to chose a wean in dose or interval  - If discharge on 2/27 then ok to continue IV HM at weaned rate until early afternoon before stopping  - Ok to given 1x dose of IV HM 1 mg prior to discharge given need for this in past and that patient has tolerated this in the past without issue  - Continue Oxycodone liquid 20 mg q4h PRN (please double check the amount she has at home and then prescribe the volume needed in order to get her to a clinic visit with me, anticipating to have this visit on Wednesday, 05/13/23)  - Continue home Butrans, APAP, Baclofen at discharge We will continue to follow peripherally     Medications trialed during this admission (or pertinent in past):   Ketamine infusions, lidocaine infusion, Butrans patch, oxycodone, IV dilaudid     Naloxone Rx at discharge?  Is patient on opioids? Yes.  1)Is dose >50MME?  Yes.  2) Is patient prescribed a benzodiazepine (w opioids)? Yes.  3)Hx of overdose?  No.  4) Hx of substance use disorder? No.  5) Opioids likely to last greater than a week after discharge? Yes.     If yes to 2 or more, prescribe naloxone at discharge.  Nasal narcan for most insured (Nasal narcan 4mg /actuation, prescribe 1 kit, instructions at [email or web address]).  For uninsured, chronic pain can work to assist in finding an option.  OTC nasal narcan now available at most pharmacies for around $45.    Interim History  There were no Acute Events Overnight.  The patient is obtaining adequate pain relief on current medication regimen and feels that their pain is Somewhat controlled    Analgesia Evaluation:  Pain at minimum: 5/10  Pain at maximum: 9/10  Current pain medication regimen (including how frequent PRN's were used):  Butrans 57mcg/hr weekly patch, Tylenol 1000mg  TID, Baclofen 5mg  TID, Oxycodone 20mg  q4hr PRN (x6), Dilaudid 1mg  q4hr PRN (x6)      Inpatient Medications  Current Facility-Administered Medications   Medication Dose Route Frequency Provider Last Rate Last Admin    acetaminophen (TYLENOL) oral liquid  1,000 mg Enteral tube: post-pyloric (duodenum, jejunum) Q8H SCH Resweber, Collier Salina, MD   1,000 mg at 05/06/23 1345    ammonium lactate (LAC-HYDRIN) 12 % lotion 1 Application  1 Application Topical BID Resweber, Collier Salina, MD   1 Application at 05/06/23 1610    apixaban (ELIQUIS) tablet 5 mg  5 mg Oral BID Resweber, Collier Salina, MD   5 mg at 05/06/23 9604    baclofen (LIORESAL) tablet 5 mg  5 mg Oral TID Cydney Ok, MD   5 mg at 05/06/23 1346    buprenorphine 20 mcg/hour transdermal patch 1 patch  1 patch Transdermal Weekly Resweber, Collier Salina, MD   1 patch at 05/02/23 0940    cetirizine (ZYRTEC) oral syrup  20 mg Enteral tube: post-pyloric (duodenum, jejunum) Daily PRN Resweber, Collier Salina, MD   20 mg at 05/04/23 0536    diphenhydrAMINE (BENADRYL) capsule/tablet 25 mg  25 mg Oral Nightly PRN Resweber, Collier Salina, MD        emollient combination no.92 (LUBRIDERM) lotion   Topical Q1H PRN Resweber, Collier Salina, MD        escitalopram oxalate (LEXAPRO) tablet 5 mg  5 mg Enteral tube: post-pyloric (duodenum, jejunum) Daily Resweber, Collier Salina, MD   5 mg at 05/06/23 5409    famotidine (PEPCID) oral suspension  20 mg Enteral tube: post-pyloric (duodenum, jejunum) Nightly Resweber, Collier Salina, MD   20 mg at 05/05/23 2013    flu vaccine TS 2024-25(69mos up)(PF)(FLULAVAL, FLUARIX, FLUZONE)  0.5 mL Intramuscular During hospitalization Resweber, Collier Salina, MD        fluticasone propionate (FLONASE) 50 mcg/actuation nasal spray 1 spray  1 spray Topical Weekly Resweber, Collier Salina, MD   1 spray at 05/02/23 0946    hydrocortisone 1 % cream   Topical BID PRN Resweber, Collier Salina, MD        hydrocortisone 2.5 % cream   Topical BID PRN Resweber, Collier Salina, MD        HYDROmorphone (PF) (DILAUDID) injection 1 mg  1 mg Intravenous Q4H PRN Revankar, Rishab R, MD   1 mg at 05/06/23 1345    hydrOXYzine (ATARAX) tablet 25 mg  25 mg Oral Q6H PRN Revankar, Rishab R, MD        hyoscyamine (LEVSIN) tablet 125 mcg  125 mcg Oral Q4H PRN Resweber, Collier Salina, MD        lidocaine (ASPERCREME) 4 % 1 patch  1 patch Transdermal Daily Resweber, Collier Salina, MD   1 patch at 05/06/23 1013    LORazepam (ATIVAN) tablet 1 mg  1 mg Oral BID PRN Resweber, Collier Salina, MD   1 mg at 05/06/23 1555    melatonin tablet 3 mg  3 mg Oral Nightly PRN Resweber, Collier Salina, MD        naloxegol (MOVANTIK) tablet 12.5 mg  12.5 mg Oral Daily PRN Resweber, Collier Salina, MD        [Provider Hold] OLANZapine zydis (ZYPREXA) disintegrating tablet 5 mg  5 mg Oral Q PM Resweber, Collier Salina, MD        ondansetron [facility]) injection 8 mg  8  mg Intravenous Q8H PRN Resweber, Collier Salina, MD   8 mg at 05/05/23 1254    oxyCODONE (ROXICODONE) 5 mg/5 mL solution 20 mg  20 mg Oral Q4H PRN Revankar, Rishab R, MD   20 mg at 05/06/23 1551    pantoprazole (Protonix) oral suspension  40 mg Oral BID Gracelyn Nurse, MD   40 mg at 05/06/23 1610    promethazine (PHENERGAN) tablet 12.5 mg  12.5 mg Oral Q6H Resweber, Collier Salina, MD   12.5 mg at 05/06/23 1551    scopolamine (TRANSDERM-SCOP) 1 mg over 3 days topical patch 1 mg  1 patch Topical Q72H Resweber, Collier Salina, MD   1 mg at 05/04/23 9604    simethicone (MYLICON) chewable tablet 80 mg  80 mg Oral BID PRN Revankar, Rishab R, MD        vancomycin (FIRVANQ) (50 mg/mL) oral solution  125 mg Oral Q6H Resweber, Collier Salina, MD   125 mg at 05/06/23 1345    zinc oxide-cod liver oil (DESITIN 40%) Paste   Topical Daily PRN Resweber, Collier Salina, MD             Objective:     Vital Signs    Temp:  [36.6 ??C (97.9 ??F)-37 ??C (98.6 ??F)] 36.8 ??C (98.2 ??F)  Heart Rate:  [77-83] 77  Resp:  [18] 18  BP: (113-120)/(56-71) 120/67  MAP (mmHg):  [72-85] 84  SpO2:  [97 %-99 %] 98 %      Physical Exam    GENERAL:  The patient is a well developed, well-nourished female sitting up on the edge of the bed and appears to be in no apparent distress initially but who became very anxious when discussing possible discharge planning.   HEAD/NECK:    Reveals normocephalic/atraumatic. Mucous membranes are moist.  LUNGS:   Normal work of breathing.  EXTREMITIES:  Good peripheral pulses, no clubbing, cyanosis, or edema was noted.  NEUROLOGIC:    The patient was alert and oriented times three with normal language, attention, cognition and memory. Cranial nerve exam was normal.  PSYCH:  Appropriate mood and affect.      Test Results    All lab results last 24 hours:    Recent Results (from the past 24 hours)   Basic Metabolic Panel    Collection Time: 05/06/23  8:30 AM   Result Value Ref Range    Sodium 140 135 - 145 mmol/L    Potassium 4.1 3.4 - 4.8 mmol/L    Chloride 98 98 - 107 mmol/L    CO2 29.0 20.0 - 31.0 mmol/L    Anion Gap 13 5 - 14 mmol/L    BUN 11 9 - 23 mg/dL    Creatinine 5.40 (L) 0.55 - 1.02 mg/dL    BUN/Creatinine Ratio 22     eGFR CKD-EPI (2021) Female >90 >=60 mL/min/1.7m2    Glucose 88 70 - 179 mg/dL    Calcium 9.9 8.7 - 98.1 mg/dL   CBC    Collection Time: 05/06/23  8:30 AM   Result Value Ref Range    WBC 9.3 3.6 - 11.2 10*9/L    RBC 4.19 3.95 - 5.13 10*12/L    HGB 12.3 11.3 - 14.9 g/dL    HCT 19.1 47.8 - 29.5 %    MCV 85.9 77.6 - 95.7 fL    MCH 29.2 25.9 - 32.4 pg    MCHC 34.0 32.0 - 36.0 g/dL    RDW 62.1 (H) 30.8 - 15.2 %  MPV 9.8 6.8 - 10.7 fL    Platelet 201 150 - 450 10*9/L   Magnesium Level    Collection Time: 05/06/23  8:30 AM   Result Value Ref Range    Magnesium 2.1 1.6 - 2.6 mg/dL   Ferritin    Collection Time: 05/06/23  8:30 AM   Result Value Ref Range    Ferritin 70.7 7.3 - 270.7 ng/mL       Imaging: Radiology studies were personally reviewed      Problem List    Principal Problem:    C. difficile diarrhea  Active Problems:    [name]    Acute deep vein thrombosis (DVT) of axillary vein of right upper extremity (CMS-HCC)    Severe protein-calorie malnutrition (CMS-HCC)    Lower abdominal pain    Generalized anxiety disorder with panic attacks    MDD (major depressive disorder)      Nat Christen, MD  Assistant Professor of Anesthesiology  Department of Anesthesiology   Divisions of General Anesthesiology and Pain Medicine     Medical Decision Making    Problems Addressed:  1 or more chronic illnesses with severe exacerbation, progression, or side effects of treatment (high)    Amount/Complexity:  Reviewed external records: Primary team progress note, psychiatry consult note, PT and OT notes  Reviewed results: 2/26 CBC (increased hgb from 12.0 on 2/24 to 12.3 on 2/26), 2/26 BMP, 2/26 ferritin (WNLs)  Ordered tests: none  Assessment requiring independent historian: none  Discussion of management/test results with medical professional: Primary team  Independent interpretation of test: none    Risk  High: Drug therapy requiring intensive monitoring for toxicity, Decision regarding hospitalization, Elective Major Surgery w/ risk factors, Emergency Major Surgery, Decision to DNR or de-escalate care because of poor prognosis, parental controlled substances (IV opioids)

## 2023-05-06 NOTE — Unmapped (Signed)
 Visited with patient today on 4 Oncology. Patient shared about her hospital experience and relationship issues. Provided emotional support. We plan to re-start our visits after she discharges home. She was very pleased that [name] Pouch, PsyD will be following both outpatient and inpatient.

## 2023-05-06 NOTE — Unmapped (Signed)
 Oncology (MEDO) Progress Note    Assessment & Plan:   [name] [name] is a 23 y.o. female whose presentation is complicated by [name] c/b desmoid fibromatosis, recurrent ileus, GI bleeds, DVT, MDD, and GAD that presented to [facility] with C diff infection.     Principal Problem:    C. difficile diarrhea  Active Problems:    [name]    Acute deep vein thrombosis (DVT) of axillary vein of right upper extremity (CMS-HCC)    Severe protein-calorie malnutrition (CMS-HCC)    Lower abdominal pain    Generalized anxiety disorder with panic attacks    MDD (major depressive disorder)    Active Problems    C Difficile Infection  Patient diagnosed with C diff via stool sample collected on 2/19. Started on PO Vancomycin on that date with plans for 14 day course. Since starting the Vancomycin, diarrhea has started to improve.  -Continue PO Vancomycin, plan for 14d course (2/19-3/5)     Acute on chronic abdominal pain and nausea  Complex situation discussed at length in prior notes. In brief, her pain and nausea are (deeply) multifactorial but consensus among treatment team is that new / unknown organic issue is unlikely. Plan to focus on symptom control, adequate nutrition.  Patient is currently at goal tube feeding, tolerating well.  No visible emesis noted.  Still reports persistent nausea but on aggressive nausea management as below. Will need to consult chronic pain service here at [facility] on monday AM when [name] is attending in service.   -NO CHANGES to pain regimen without multiparty discussion including patient, unless true emergency.  -Pain management (continued from [facility]): Tylenol 1g q8h scheduled, Buprenorphine patch 87mcg/hr weekly, Oxycodone 20 mg q4h PRN, Dilaudid 1 mg q4h PRN (don't give dilaudid and oxy within 1 hour of each other)  -Spasticity (continued from [facility]): Baclofen 5mg  TID (started in rehab). We discussed valium but never used this for spasticity while inpatient as she has a contract with outpatient pain provider  -Nausea (continued from [facility]): Continue IV zofran PRN, PO Phenergan q6h scheduled. Used IV Phenergan at [facility], but was transitioned to oral 2/17. Also used scopolamine. She refused the zyprexa nightly.   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 [name]. Patient has numerous sites of disease but most bothersome has been mesenteric sites which have caused chronic abdominal pain, nausea/vomiting. Please see Dr. Brynda Rim note from 1/14 addended 1/15 with his very thoughtful assessment of this situation and current hospitalization. AIR had continued home nirogacestat as tolerated. Patient tried holding it for a few days to see if it helped abdominal symptoms. okay to hold nirogesat while having difficulty with oral meds, no urgency to resume per Dr [name] .    Morbiliform rash (resolved)  Likely 2/2 PO vancomycin.Refuses PO benadryl and PO atarax. Thus, we are compounding IV benadryl into a bug of IVF to infuse in order to prevent other unwanted side effects.      DVT of LUE  Had LUE DVT diagnosed via PVL on 04/06/2023 associated with PICC line. Has been on Eliquis since that time.   -Continue Eliquis     Malnutrition  -Nutrition consult order placed  -Continue tube feeds     Chronic Problems  GAD - MDD  -Continue Lexapro    Issues Impacting Complexity of Management:  -Discussion of risks/benefits of Acute on chronic abdominal pain, desmoid fibromatosis with the patient/family including risk of risk factor: infection, medical complications, and urinary retention.  -Parenteral controlled medications: IV Dilaudid    Medical Decision Making: Reviewed records from the following unique sources  Admission notes, and speciality notes.    Daily Checklist:  Diet: Tube Feeds  DVT PPx: Patient Already on Full Anticoagulation with Eliquis  Electrolytes: Replete Potassium to >/=4 and Magnesium to >/=2  Code Status: Full Code  Dispo:  pending return to baseline stool function and pain    Team Contact Information:   Primary Team: Oncology (MEDO)  Primary Resident: Oneal Grout, MD, MD  Resident's Pager: [phone] (Oncology Senior Resident)    Interval History:   No acute events overnight.  Appears to be very comfortable and speaking in full sentences without any acute distress.  Requesting further lab work including iron panels and further monitoring of liver labs and states having observed bright red bleeding in the stool.  Hemoglobin at this time is stable at 12.  Counseled patient to make nursing and MD aware of any subsequent episodes, yet to be witnessed by me or our team.    Will be seen by chronic pain attending today and anticipating discharge tomorrow.    Objective:   Temp:  [36.5 ??C (97.7 ??F)-37 ??C (98.6 ??F)] 36.6 ??C (97.9 ??F)  Heart Rate:  [76-84] 81  Resp:  [18] 18  BP: (113-120)/(56-77) 113/60  SpO2:  [96 %-99 %] 99 %,   Intake/Output Summary (Last 24 hours) at 05/06/2023 0738  Last data filed at 05/06/2023 0336  Gross per 24 hour   Intake 360 ml   Output --   Net 360 ml   , No intake/output data recorded.,  ,  , Body mass index is 20.51 kg/m??.,   Wt Readings from Last 3 Encounters:   05/03/23 55.9 kg (123 lb 3.8 oz)   04/06/23 54.4 kg (120 lb)   03/24/23 52.8 kg (116 lb 6.4 oz)     Gen: Chronically ill female laying in bed with NG tube in place  HENT: atraumatic, normocephalic  Heart: RRR  Lungs: CTAB, no crackles or wheezes  Abdomen: soft, NTND  Extremities: No edema

## 2023-05-07 DIAGNOSIS — D48119 Desmoid tumor: Principal | ICD-10-CM

## 2023-05-07 LAB — CBC
HEMATOCRIT: 39.1 % (ref 34.0–44.0)
HEMOGLOBIN: 13.6 g/dL (ref 11.3–14.9)
MEAN CORPUSCULAR HEMOGLOBIN CONC: 34.6 g/dL (ref 32.0–36.0)
MEAN CORPUSCULAR HEMOGLOBIN: 29.7 pg (ref 25.9–32.4)
MEAN CORPUSCULAR VOLUME: 85.7 fL (ref 77.6–95.7)
MEAN PLATELET VOLUME: 9.7 fL (ref 6.8–10.7)
PLATELET COUNT: 226 10*9/L (ref 150–450)
RED BLOOD CELL COUNT: 4.56 10*12/L (ref 3.95–5.13)
RED CELL DISTRIBUTION WIDTH: 16.2 % — ABNORMAL HIGH (ref 12.2–15.2)
WBC ADJUSTED: 8.8 10*9/L (ref 3.6–11.2)

## 2023-05-07 LAB — BASIC METABOLIC PANEL
ANION GAP: 14 mmol/L (ref 5–14)
BLOOD UREA NITROGEN: 10 mg/dL (ref 9–23)
BUN / CREAT RATIO: 20
CALCIUM: 10.3 mg/dL (ref 8.7–10.4)
CHLORIDE: 99 mmol/L (ref 98–107)
CO2: 27 mmol/L (ref 20.0–31.0)
CREATININE: 0.49 mg/dL — ABNORMAL LOW (ref 0.55–1.02)
EGFR CKD-EPI (2021) FEMALE: >90 mL/min/{1.73_m2} (ref >=60)
GLUCOSE RANDOM: 132 mg/dL (ref 70–179)
POTASSIUM: 3.9 mmol/L (ref 3.4–4.8)
SODIUM: 140 mmol/L (ref 135–145)

## 2023-05-07 MED ORDER — SODIUM BICARBONATE 650 MG TABLET
ORAL_TABLET | Freq: Once | GASTROSTOMY | 0 refills | 0.00 days | Status: CP | PRN
Start: 2023-05-07 — End: 2023-05-07

## 2023-05-07 MED ORDER — FAMOTIDINE 20 MG TABLET
ORAL_TABLET | Freq: Every evening | ORAL | 0 refills | 30.00 days | Status: CP
Start: 2023-05-07 — End: ?

## 2023-05-07 MED ORDER — VANCOMYCIN 50 MG/ML ORAL SOLUTION
Freq: Four times a day (QID) | ORAL | 0 refills | 15.00 days | Status: CP
Start: 2023-05-07 — End: 2023-05-21
  Filled 2023-05-07: qty 150, 14d supply, fill #0

## 2023-05-07 MED ORDER — OXYCODONE 5 MG/5 ML ORAL SOLUTION
ORAL | 0 refills | 6.00 days | Status: CP | PRN
Start: 2023-05-07 — End: 2023-05-13
  Filled 2023-05-07: qty 720, 6d supply, fill #0

## 2023-05-07 MED ORDER — LIPASE-PROTEASE-AMYLASE 24,000-76,000-120,000 UNIT CAPSULE,DELAYED REL
ORAL_CAPSULE | Freq: Once | GASTROENTERAL | 0 refills | 0.00 days | Status: CP | PRN
Start: 2023-05-07 — End: 2023-05-07

## 2023-05-07 MED ORDER — PANTOPRAZOLE 40 MG TABLET,DELAYED RELEASE
ORAL_TABLET | Freq: Every day | ORAL | 0 refills | 30.00 days | Status: CP
Start: 2023-05-07 — End: ?
  Filled 2023-05-07: qty 30, 30d supply, fill #0

## 2023-05-07 MED ORDER — APIXABAN 5 MG TABLET
ORAL_TABLET | Freq: Two times a day (BID) | ORAL | 2 refills | 30.00 days | Status: CP
Start: 2023-05-07 — End: 2023-07-10
  Filled 2023-05-08: qty 60, 30d supply, fill #0

## 2023-05-07 MED ADMIN — oxyCODONE (ROXICODONE) 5 mg/5 mL solution 20 mg: 20 mg | ORAL | @ 05:00:00 | Stop: 2023-05-07

## 2023-05-07 MED ADMIN — acetaminophen (TYLENOL) oral liquid: 1000 mg | ENTERAL | @ 19:00:00 | Stop: 2023-05-07

## 2023-05-07 MED ADMIN — baclofen (LIORESAL) tablet 5 mg: 5 mg | ORAL | @ 03:00:00

## 2023-05-07 MED ADMIN — ondansetron (ZOFRAN) injection 8 mg: 8 mg | INTRAVENOUS | @ 08:00:00 | Stop: 2023-05-07

## 2023-05-07 MED ADMIN — promethazine (PHENERGAN) tablet 12.5 mg: 12.5 mg | ORAL | @ 03:00:00

## 2023-05-07 MED ADMIN — vancomycin (FIRVANQ) (50 mg/mL) oral solution: 125 mg | ORAL | @ 01:00:00 | Stop: 2023-05-13

## 2023-05-07 MED ADMIN — lidocaine (ASPERCREME) 4 % 1 patch: 1 | TRANSDERMAL | @ 15:00:00 | Stop: 2023-05-07

## 2023-05-07 MED ADMIN — pantoprazole (Protonix) oral suspension: 40 mg | ORAL | @ 01:00:00

## 2023-05-07 MED ADMIN — famotidine (PEPCID) oral suspension: 20 mg | ENTERAL | @ 01:00:00

## 2023-05-07 MED ADMIN — HYDROmorphone (PF) (DILAUDID) injection 1 mg: 1 mg | INTRAVENOUS | @ 03:00:00 | Stop: 2023-05-07

## 2023-05-07 MED ADMIN — baclofen (LIORESAL) tablet 5 mg: 5 mg | ORAL | @ 19:00:00 | Stop: 2023-05-07

## 2023-05-07 MED ADMIN — oxyCODONE (ROXICODONE) 5 mg/5 mL solution 20 mg: 20 mg | ORAL | @ 19:00:00 | Stop: 2023-05-07

## 2023-05-07 MED ADMIN — apixaban (ELIQUIS) tablet 5 mg: 5 mg | ORAL | @ 14:00:00 | Stop: 2023-05-07

## 2023-05-07 MED ADMIN — baclofen (LIORESAL) tablet 5 mg: 5 mg | ORAL | @ 14:00:00 | Stop: 2023-05-07

## 2023-05-07 MED ADMIN — ammonium lactate (LAC-HYDRIN) 12 % lotion 1 Application: 1 | TOPICAL | @ 15:00:00 | Stop: 2023-05-07

## 2023-05-07 MED ADMIN — oxyCODONE (ROXICODONE) 5 mg/5 mL solution 20 mg: 20 mg | ORAL | @ 10:00:00 | Stop: 2023-05-07

## 2023-05-07 MED ADMIN — promethazine (PHENERGAN) tablet 12.5 mg: 12.5 mg | ORAL | @ 10:00:00 | Stop: 2023-05-07

## 2023-05-07 MED ADMIN — HYDROmorphone (PF) (DILAUDID) injection 1 mg: 1 mg | INTRAVENOUS | @ 16:00:00 | Stop: 2023-05-07

## 2023-05-07 MED ADMIN — escitalopram oxalate (LEXAPRO) tablet 5 mg: 5 mg | ENTERAL | @ 14:00:00 | Stop: 2023-05-07

## 2023-05-07 MED ADMIN — vancomycin (FIRVANQ) (50 mg/mL) oral solution: 125 mg | ORAL | @ 14:00:00 | Stop: 2023-05-07

## 2023-05-07 MED ADMIN — HYDROmorphone (PF) (DILAUDID) injection 1 mg: 1 mg | INTRAVENOUS | @ 23:00:00 | Stop: 2023-05-07

## 2023-05-07 MED ADMIN — HYDROmorphone (PF) (DILAUDID) injection 1 mg: 1 mg | INTRAVENOUS | @ 08:00:00 | Stop: 2023-05-07

## 2023-05-07 MED ADMIN — oxyCODONE (ROXICODONE) 5 mg/5 mL solution 20 mg: 20 mg | ORAL | @ 14:00:00 | Stop: 2023-05-07

## 2023-05-07 MED ADMIN — oxyCODONE (ROXICODONE) 5 mg/5 mL solution 20 mg: 20 mg | ORAL | @ 01:00:00 | Stop: 2023-05-10

## 2023-05-07 MED ADMIN — acetaminophen (TYLENOL) oral liquid: 1000 mg | ENTERAL | @ 03:00:00

## 2023-05-07 MED ADMIN — vancomycin (FIRVANQ) (50 mg/mL) oral solution: 125 mg | ORAL | @ 08:00:00 | Stop: 2023-05-07

## 2023-05-07 MED ADMIN — apixaban (ELIQUIS) tablet 5 mg: 5 mg | ORAL | @ 03:00:00

## 2023-05-07 MED ADMIN — acetaminophen (TYLENOL) oral liquid: 1000 mg | ENTERAL | @ 10:00:00 | Stop: 2023-05-07

## 2023-05-07 MED ADMIN — promethazine (PHENERGAN) tablet 12.5 mg: 12.5 mg | ORAL | @ 14:00:00 | Stop: 2023-05-07

## 2023-05-07 MED ADMIN — pantoprazole (Protonix) oral suspension: 40 mg | ORAL | @ 14:00:00 | Stop: 2023-05-07

## 2023-05-07 MED ADMIN — scopolamine (TRANSDERM-SCOP) 1 mg over 3 days topical patch 1 mg: 1 | TOPICAL | @ 14:00:00 | Stop: 2023-05-07

## 2023-05-07 NOTE — Unmapped (Incomplete)
 Patient is alert and oriented x4. Patient and mom are very pleasant. MD and team at the bedside to discuss POC for patient and any concerns. No acute changes. VSS are stable. Patient anticipating discharge today.   Problem: Adult Inpatient Plan of Care  Goal: Plan of Care Review  Outcome: Progressing  Goal: Patient-Specific Goal (Individualized)  Outcome: Progressing  Goal: Absence of Hospital-Acquired Illness or Injury  Outcome: Progressing  Intervention: Identify and Manage Fall Risk  Recent Flowsheet Documentation  Taken 05/07/2023 0900 by Vale Peraza, Arby Barrette, RN  Safety Interventions:   low bed   lighting adjusted for tasks/safety   fall reduction program maintained   family at bedside   infection management  Intervention: Prevent Skin Injury  Recent Flowsheet Documentation  Taken 05/07/2023 0900 by Keanan Melander, Arby Barrette, RN  Positioning for Skin: Supine/Back  Taken 05/07/2023 0800 by Michaeljohn Biss, Arby Barrette, RN  Positioning for Skin: Supine/Back  Goal: Optimal Comfort and Wellbeing  Outcome: Progressing  Goal: Readiness for Transition of Care  Outcome: Progressing  Goal: Rounds/Family Conference  Outcome: Progressing     Problem: Infection  Goal: Absence of Infection Signs and Symptoms  Outcome: Progressing     Problem: Self-Care Deficit  Goal: Improved Ability to Complete Activities of Daily Living  Outcome: Progressing     Problem: Malnutrition  Goal: Improved Nutritional Intake  Outcome: Progressing

## 2023-05-07 NOTE — Unmapped (Signed)
 Physician Discharge Summary [facility]  4 [facility]C [facility]  [address]  [place] [address]  Dept: [phone]  Loc: [phone]     Identifying Information:   [name] [name]  07-01-2000  [id number]    Primary Care Physician: Jarold Motto, [facility]     Referring Physician: Benita Stabile Clevel*     Code Status: Full Code    Admit Date: 05/02/2023    Discharge Date: 05/07/2023     Discharge To: Home with Home Health and/or PT/OT    Discharge Service: [facility] - Oncology Floor Team (MED [place])     Discharge Attending Physician: Dionicia Abler, DO    Discharge Diagnoses:  Principal Problem:    C. difficile diarrhea  Active Problems:    [name]    Acute deep vein thrombosis (DVT) of axillary vein of right upper extremity (CMS-HCC)    Severe protein-calorie malnutrition (CMS-HCC)    Lower abdominal pain    Generalized anxiety disorder with panic attacks    MDD (major depressive disorder)    Nasogastric tube present      Outpatient Provider Follow Up Issues:   Follow-up Plan after discharge:  Issues related to hospitalization: Cdiff infection  [ ]  Continue PO Vancomycin for 14d course (2/19-3/5)    Oncology specific plans going forward: pending further discussion with primary  Patient's primary oncologist and/or nurse navigator: [facility] handoff via Epic message or direct conversation?: Yes.    Hospital Course: [name] [name] is a 23 y.o. female whose presentation is complicated by [name] c/b desmoid fibromatosis, recurrent ileus, GI bleeds, DVT, MDD, and GAD that presented to [facility] with C diff infection.    C Difficile Infection  Patient diagnosed with C diff via stool sample collected on 2/19. Started on PO Vancomycin on that date with plans for 14 day course. Since starting the Vancomycin, diarrhea has started to improve and stool frequency at time of discharge is reported to be baseline.   -Continue PO Vancomycin, plan for 14d course (2/19-3/5)     Acute on chronic abdominal pain and nausea  Complex situation discussed at length in prior notes. In brief, her pain and nausea are (deeply) multifactorial but consensus among treatment team is that new / unknown organic issue is unlikely. She had some bleeding in her stools, which per chart review, is a known finding and her Hb remained stable 12-13 without requiring any transfusions. Focused on symptom control, adequate nutrition. Patient was at goal tube feeding, tolerating well. We also worked closely with her psychologist who advised validation of her concerns but that her complex psych history often played a role into her requests for further inpatient care. Pain management was offered in close consultation by Dr [name] who was on service and is the patient's outpatient attending. She has follow-up with him scheduled the week after discharge.      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 [name]. Patient has numerous sites of disease but most bothersome has been mesenteric sites which have caused chronic abdominal pain, nausea/vomiting. Please see Dr. Brynda Rim note from 1/14 addended 1/15 with his very thoughtful assessment of this situation and current hospitalization. AIR had continued home nirogacestat as tolerated. Patient tried holding it for a few days to see if it helped abdominal symptoms. Per Dr. [name], did not resume while inpatient.    DVT of LUE  Had LUE DVT diagnosed via PVL on 04/06/2023 associated with PICC  line. Has been on Eliquis since that time. Continued with inpatient.    Procedures:  No admission procedures for hospital encounter.  ______________________________________________________________________  Discharge Medications:     Your Medication List        STOP taking these medications      acetaminophen 650 mg/20.3 mL Soln  Commonly known as: TYLENOL  Replaced by: acetaminophen 500 MG tablet     ammonium lactate 12 % lotion  Commonly known as: LAC-HYDRIN     hydrocortisone 1 % cream     melatonin 3 mg Tab     pantoprazole 40 mg injection  Commonly known as: Protonix  Replaced by: pantoprazole 40 MG tablet     psyllium 3.4 gram packet  Commonly known as: METAMUCIL     simethicone 80 MG chewable tablet  Commonly known as: MYLICON     sucralfate 100 mg/mL suspension  Commonly known as: CARAFATE     zinc oxide-cod liver oil 40 % Pste  Commonly known as: DESITIN 40%            START taking these medications      acetaminophen 500 MG tablet  Commonly known as: TYLENOL EXTRA STRENGTH  Take 2 tablets (1,000 mg total) by mouth every eight (8) hours.  Replaces: acetaminophen 650 mg/20.3 mL Soln     famotidine 20 MG tablet  Commonly known as: PEPCID  Take 1 tablet (20 mg total) by mouth nightly.     lidocaine 4 % patch  Commonly known as: ASPERCREME  Place 1 patch on the skin daily. Apply for 12 hours then remove for 12 hours.  Start taking on: May 08, 2023     oxyCODONE 5 mg/5 mL solution  Commonly known as: ROXICODONE  Take 20 mL (20 mg total) by mouth every four (4) hours as needed for pain for up to 6 days.     pancrelipase (Lip-Prot-Amyl) 24,000-76,000 -120,000 unit Cpdr delayed release capsule  Commonly known as: CREON  1 capsule (24,000 units of lipase total) by Enteral tube: gastric route once as needed for up to 1 dose. Use only if NGT is clogged     pantoprazole 40 MG tablet  Commonly known as: PROTONIX  Take 1 tablet (40 mg total) by mouth daily.  Replaces: pantoprazole 40 mg injection     sodium bicarbonate 650 mg tablet  1 tablet (650 mg total) by G-tube route once as needed for heartburn for up to 1 dose. Please use if G-tube is clogged            CHANGE how you take these medications      fluticasone propionate 50 mcg/actuation nasal spray  Commonly known as: FLONASE  Use 1 spray under the butrans patch once every 7 days to prevent allergic reaction to the adhesive  What changed:   how much to take  how to take this  when to take this  additional instructions     vancomycin 50 mg/mL Solr oral solution  Commonly known as: FIRVANQ  Take 2.5 mL (125 mg total) by mouth every six (6) hours for 6 days. Discard remaining medication.  What changed: additional instructions            CONTINUE taking these medications      apixaban 5 mg Tab  Commonly known as: ELIQUIS  Take 1 tablet (5 mg total) by mouth two (2) times a day.     baclofen 5 mg Tab tablet  Commonly known as: LIORESAL  Take 1 tablet (5 mg total) by mouth Three (3) times a day.     buprenorphine 20 mcg/hour Ptwk transdermal patch  Commonly known as: BUTRANS  Place 1 patch on the skin once a week.     escitalopram oxalate 5 MG tablet  Commonly known as: LEXAPRO  Take 1 tablet (5 mg total) by mouth daily.     LORazepam 1 MG tablet  Commonly known as: ATIVAN  Take 1 tablet (1 mg total) by mouth two (2) times a day as needed for anxiety.     multivitamin with folic acid 400 mcg Tab tablet  Take 1 tablet by mouth daily.     naloxegol 12.5 mg tablet  Commonly known as: MOVANTIK  Take 1 tablet (12.5 mg total) by mouth daily as needed (constipation).     promethazine 12.5 MG tablet  Commonly known as: PHENERGAN  Take 1 tablet (12.5 mg total) by mouth every six (6) hours as needed for nausea.     scopolamine 1 mg over 3 days  Commonly known as: TRANSDERM-SCOP  Place 1 patch (1 mg total) on the skin every third day.            Allergies:  Adhesive tape-silicones; Ferrlecit [sodium ferric gluconat-sucrose]; Levofloxacin; Methylnaltrexone; Neomycin; Papaya; Morphine; Zosyn [piperacillin-tazobactam]; Compazine [prochlorperazine]; Iron analogues; Reglan [metoclopramide hcl]; Iron dextran; and Latex, natural rubber  ______________________________________________________________________  Pending Test Results (if blank, then none):  none    Most Recent Labs:  All lab results last 24 hours -   Recent Results (from the past 24 hours)   Iron Panel    Collection Time: 05/06/23  3:50 PM   Result Value Ref Range    Iron 35 (L) 50 - 170 ug/dL    TIBC 161 096 - 045 ug/dL    Iron Saturation (%) 13 (L) 20 - 55 %   CBC    Collection Time: 05/07/23 11:20 AM   Result Value Ref Range    WBC 8.8 3.6 - 11.2 10*9/L    RBC 4.56 3.95 - 5.13 10*12/L    HGB 13.6 11.3 - 14.9 g/dL    HCT 40.9 81.1 - 91.4 %    MCV 85.7 77.6 - 95.7 fL    MCH 29.7 25.9 - 32.4 pg    MCHC 34.6 32.0 - 36.0 g/dL    RDW 78.2 (H) 95.6 - 15.2 %    MPV 9.7 6.8 - 10.7 fL    Platelet 226 150 - 450 10*9/L   Basic Metabolic Panel    Collection Time: 05/07/23 11:20 AM   Result Value Ref Range    Sodium 140 135 - 145 mmol/L    Potassium 3.9 3.4 - 4.8 mmol/L    Chloride 99 98 - 107 mmol/L    CO2 27.0 20.0 - 31.0 mmol/L    Anion Gap 14 5 - 14 mmol/L    BUN 10 9 - 23 mg/dL    Creatinine 2.13 (L) 0.55 - 1.02 mg/dL    BUN/Creatinine Ratio 20     eGFR CKD-EPI (2021) Female >90 >=60 mL/min/1.43m2    Glucose 132 70 - 179 mg/dL    Calcium 08.6 8.7 - 57.8 mg/dL     Relevant Studies/Radiology (if blank, then none):  No results found.  ______________________________________________________________________  Discharge Instructions:   Follow Up instructions and Outpatient Referrals     Ambulatory Referral to Home Infusion      Reason for referral: medical necessity    Performing location?: External  Home Health Requested Disciplines: Other (see comments)     **Please contact your service pharmacist for assistance with discharge   home health infusion monitoring.      Ambulatory Referral to Physical Therapy      Special Instructions: eval and treat    Reason for referral: medical necessity      Appointments which have been scheduled for you      May 11, 2023 3:00 PM  (Arrive by 2:45 PM)  RETURN  GENERAL with Carollee Leitz, MD  [facility] PHYSICAL MEDICINE [facility] [address] [place] [facility] REGION) [address] [place] [address]  [phone]        May 13, 2023 12:30 PM  (Arrive by 12:00 PM)  RETURN VIDEO MYCHART with Nat Christen., MD  [facility] PAIN MANAGEMENT CENTER [address] [place] [facility] REGION) [address]  [address]  [place] [address]  [phone]   Please sign into My [place] Chart at least 15 minutes before your appointment to complete the eCheck-In process. You must complete eCheck-In before you can start your video visit. We also recommend testing your audio and video connection to troubleshoot any issues before your visit begins. Click ???Join Video Visit??? to complete these checks. Once you have completed eCheck-In and tested your audio and video, click ???Join Call??? to connect to your visit.     For your video visit, you will need a computer with a working camera, speaker and microphone, a smartphone, or a tablet with internet access.    My [place] Chart enables you to manage your health, send non-urgent messages to your provider, view your test results, schedule and manage appointments, and request prescription refills securely and conveniently from your computer or mobile device.    You can go to [email or web address] to sign in to your My [place] Chart account with your username and password. If you have forgotten your username or password, please choose the ???Forgot Username???? and/or ???Forgot Password???? links to gain access. You also can access your My [place] Chart account with the free MyChart mobile app for Android or iPhone.    If you need assistance accessing your My  Chart account or for assistance in reaching your provider's office to reschedule or cancel your appointment, please call [facility] [phone].         Jun 11, 2023 9:00 AM  (Arrive by 8:45 AM)  RETURN IBD with Modena Nunnery, MD  [facility] GI MEDICINE [place] [facility] REGION) [address]  [facility] [address]  [place] [address]  [phone]             ___________________________________________________________________  Discharge Day Services:  BP 136/80  - Pulse 86  - Temp 37.1 ??C (98.8 ??F) (Oral)  - Resp 16  - Ht 165.1 cm (5' 5)  - Wt 55.9 kg (123 lb 3.8 oz)  - SpO2 98%  - BMI 20.51 kg/m??   Pt seen on the day of discharge and determined appropriate for discharge.    Condition at Discharge: stable    Length of Discharge: I spent greater than 30 mins in the discharge of this patient.

## 2023-05-07 NOTE — Unmapped (Signed)
 I LVM for patient [name] [name] to confirm appointments on the following date(s): 3/11 labs and visit with Dr. [name]     Thanks,     Perley Jain

## 2023-05-08 ENCOUNTER — Telehealth: Payer: Self-pay | Admitting: *Deleted

## 2023-05-08 MED ORDER — LIDOCAINE 4 % TOPICAL PATCH
MEDICATED_PATCH | Freq: Every day | TRANSDERMAL | 0 refills | 30.00 days | Status: CP
Start: 2023-05-08 — End: ?
  Filled 2023-05-07: qty 10, 10d supply, fill #0

## 2023-05-08 MED ADMIN — sodium bicarbonate tablet 650 mg: 650 mg | GASTROENTERAL | Stop: 2023-05-07

## 2023-05-08 MED ADMIN — pancrelipase (Lip-Prot-Amyl) (CREON) 24,000-76,000 -120,000 unit delayed release capsule 24,000 units of lipase: 1 | GASTROENTERAL | Stop: 2023-05-07

## 2023-05-08 NOTE — Transitions of Care (Post Inpatient/ED Visit) (Signed)
   05/08/2023  Name: Lisa Donovan MRN: [id number] DOB: 2000-06-29  Today's TOC FU Call Status: Today's TOC FU Call Status:: Unsuccessful Call (1st Attempt) Unsuccessful Call (1st Attempt) Date: 05/08/23  Attempted to reach the patient regarding the most recent Inpatient/ED visit.  Follow Up Plan: Additional outreach attempts will be made to reach the patient to complete the Transitions of Care (Post Inpatient/ED visit) call.   Irving Shows [facility], BSN RN Care Manager/ Transition of Care [place] [phone]

## 2023-05-12 ENCOUNTER — Telehealth: Payer: Self-pay | Admitting: *Deleted

## 2023-05-12 DIAGNOSIS — D48119 Desmoid tumor: Principal | ICD-10-CM

## 2023-05-12 NOTE — Unmapped (Signed)
 Chronic Pain Follow Up Note    Assessment and Plan    1. Chronic pain syndrome    2. Generalized abdominal pain    3. Visceral hyperalgesia    4. [name]    5. Allodynia    6. Cancer associated pain    7. Chronic, continuous use of opioids    8. Diffuse myofascial pain syndrome        The patient reports they are physically located in [place] and is currently: at home. I conducted a audio/video visit. I spent  39m 11s on the video call with the patient. I spent an additional 20 minutes on pre- and post-visit activities on the date of service .     [name] [name] is a 23 y.o. female with past medical history significant for DVT (RUE, s/p Eliquis x3 months), [name] syndrome s/p colectomy, desmoid tumor, anemia, and severe protein-calorie malnutrition who is being followed at [facility] for abdominal and right shoulder pain that is consistent with the diagnosis of cancer associated pain.    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 Pt has continued and now chronic pain related to her tumors, has R shoulder pain as well though this is better controlled now. Follows with [facility] Dr [name] as well as by [facility] oncology.    Unfortunately, [name] [name] has been experiencing significantly worse abdominal pain over the last several months.  The etiology of her pain flare cycles is complex with likely multiple contributing factors.  The most likely explanation appears that she has intermittent pain flares that are caused by an unclear underlying process (potentially due to intermittent intestinal obstructions in the setting of her known mesenteric desmoid tumors, which may act as anchor points for torsion of her bowels) that resulted in an increased opioid requirement and often will lead to hospitalization.  At this point, her acute pain will be treated with IV opioids which will precipitate narcotic bowel syndrome that, in conjunction with her severe visceral hyperalgesia, quickly causes a new pain generator.  This results in a positive feedback loop with deleterious consequences in which she requires more IV opioids to treat her pain but the additional IV opioids further the underlying issue.    Visceral hyperalgesia, narcotic bowel syndrome, desmoid tumors of the gastrointestinal tract, cancer related pain, chronic pain syndrome, allodynia, generalized abdominal pain   Chronic, globally worsening; at this time patient believes pain has worsened since stopping her chemotherapy 2 months ago and in the setting of her recent gastrointestinal issues.  Patient has been admitted several times for extended periods of time over the last year often for either pouchitis versus poor p.o. intake versus pain flares.  Most recently admitted for approximately 2 months at the beginning of the year and discharged last week.  She appeared to be in the best spirits and the most comfortable that I have seen her since her admission though still notes that her pain is far from being at goal and that she still has persistent issues with her perception of bleeding through the GI tract and poor p.o. intake.  She is sleeping through the night and I noted that this is very encouraging.  However, she is requiring oxycodone 20 mg liquid every 4 hours while awake.  She notes that she is unable to tolerate her p.o. oxycodone tablets as they irritate her stomach and she feels that  they are not getting absorbed adequately thus requiring use of her NG tube to administer her pain medication requiring a liquid formulation.  Therefore, refilled oxycodone liquid 20 mg every 4 hours as needed up to 5 times per day. She notes continued benefit without side effects from her Butrans patch and we refilled this medication today.  We will follow-up in 2 weeks in person for a pill count and follow-up visit where we will also plan to begin weaning her oxycodone.    -Continue APAP 1000 mg 3 times daily  -Okay to continue ativan 0.5 mg p.o. prn for anxiety, patient will not take it at the same time as opioids or other sedating medications  -Refilled oxycodone 20 mg every 4 hours as needed, use 4-5 times during the day for the next 2 weeks during acute flare, filled x 1 week as patient has enough residual oxycodone liquid from prior discharges to bridge her to her next appointment; plan to wean oxycodone at next appointment though encouraged the patient to self wean in the interim if able  -discontinued suboxone and start Butrans 20mg  weekly   -Still working to wean off of all opioids    Of note: The patient has received both ketamine infusions and lidocaine infusions as an inpatient for flareups of her chronic abdominal pain and states that lidocaine infusions have been significantly more helpful in the past, though she is also tolerated ketamine infusions. Would recommend this in future hospitalizations to treat severe and uncontrolled abdominal pain exacerbations -previous lidocaine infusion was intermittently on due to transaminitis experienced by the patient which may or may not have been from the infusion but from TPN or an intra-abdominal process that was occurring.  She states that she did really well after the lidocaine infusion for a couple of weeks.    Myofascial pain   Chronic, ongoing, at goal. Not primary concern at this time. Patient continues to have subacute low back pain after having hyper flexion of her hip in the hospital.  No longer mentioning prior low back/hip pain in the setting of previous hematoma.  Still requiring baclofen 5 mg 3 times daily, was on this consistently during the hospitalization and would benefit given her history of diffuse myofascial pain.  Additionally, she would be at risk for withdrawal if she were to have continued absence of baclofen from her regimen.     - Consider repeat TPI PRN, though will hold off given her severe pain flare after recent desmoid injection  - Home exercise program  - Encouraged SI joint exercises  - Encouraged slow resumption of normal activity and times when pain has decreased  - Apply Voltaren gel BID to limit GI side effects  - Apply Lidocaine patches  - Baclofen 5 mg 3 times daily, filled x 3 months    Chronic, continuous use of opioids; chronic pain syndrome   Patient is managed on both Butrans patches for long-acting pain control as a partial mu agonist and oxycodone for pain flares.  Working to wean patient off of oxycodone and then to work patient down and eventually off of buprenorphine to improve bowel function while still balancing the need for pain control.  Today, since she is still in an acute flare that is residual from her last hospitalization, and to minimize the risk of her having to be readmitted for uncontrolled pain, we will continue her oxycodone at increased frequency for about 2 weeks prior to her next visit.  Patient is currently managed on  a benzodiazepine for anxiety and muscle spasms but notes that she is only taking 1 pill three times a week and knows not to take this around the time of her opioid and is working to wean this off as well.    -COMM score: 2    Medication Monitoring  [place] PDMP was reviewed today and it was appropriate.  Patient reported pill counts at home and they were appropriate  Last urine toxicology screen: Updated 12/23/2022  Treatment agreement last reviewed and signed: Updated 12/23/2022  Aberrancy with treatment plan? no  Opioid Risk: moderate (benzo)  Pain psychology: no  Benzodiazepines: yes, knows not to take alongside opioids  Naloxone: Yes, refilled on 12/23/2022  EKG: NSR with normal Qtc (437 on 11/01/2022)  I have reviewed the [facility] Medical Board statement on use of controlled substances for the treatment of pain as well as the CDC Guideline for Prescribing Opioids for Chronic Pain. I have reviewed the [place] Controlled Substance Monitoring Database.    Long Term Use of Opioid Medication   - Compliant, using opioid to facilitate functional goals, no concerns for misuse     PDMP reviewed 05/13/2023 Appropriate   Pill count 05/13/2023 Appropriate 42 tabs of oxycodone left  49 suboxone films left    Urine toxicology 12/23/22 Appropriate +oxy, +benzo, +suboxone   Treatment agreement 02/18/23 Goals: continue with nursing school   Naloxone rx Date 12/23/22     COMM Date Score: 4   Benzodiazepine Yes Ok with minimal use per psychiatry, pt will avoid taking close to opioid doses and only while awake, not near bedtime   Pain Psychology No    QTC 11/01/22 NSR, Qtc 437   Concerns yes OIH, planning to wean off opioids, planning to minimize benzo use   Opioid Risk LOW      Opioid Changes and Yearly MME  Date MME Comments   12/23/22 45 + 1.5 mg BID of Suboxone Post-discharge regimen, increased suboxone to decrease oxy   01/21/23 45 + 1 mg TID Suboxone + additional 10 mg oxy per day x 10 days Split Suboxone to TID dosing for pain, gave additional #10 oxy 10 mg for additional 10 mg oxy per day for 10 days after wisdom tooth extractions   02/18/23 75 + 20 mcg/h Butrans Increased to oxy 10 mg x4-5 per day for 2-3 weeks given patient's current pain flare in setting of dramatically increased BM frequency, switched to Butrans given pt's anxiety surrounding Suboxone and desire for more consistent analgesia with Butrans that will also minimize pill burden; plan to wean off of oxy hereafter, Lavera Guise is a Scientist, product/process development net decrease from Suboxone   05/07/23 180 + 20 mcg/h Butrans Discharged on 20 mg liquid oxycodone q4h PRN + Butrans 20 mcg/h given severe pain that has persisted as an outpatient and thus requirement remains, she is willing to work towards weaning         Return for 2 weeks in person with MD at 12:30 appt on 3/18.    PLAN:  Today I have prescribed:  Requested Prescriptions     Signed Prescriptions Disp Refills    buprenorphine (BUTRANS) 20 mcg/hour PTWK transdermal patch 4 patch 0     Sig: Place 1 patch on the skin once a week.    oxyCODONE (ROXICODONE) 5 mg/5 mL solution 720 mL 0     Sig: Take 20 mL (20 mg total) by mouth every four (4) hours as needed for pain for up to 7 days. Fill on  or after: 05/13/23    baclofen (LIORESAL) 10 MG tablet 45 tablet 2     Sig: Take 0.5 tablets (5 mg total) by mouth Three (3) times a day.     No orders of the defined types were placed in this encounter.     HPI  Erynn Vaca is a 23 y.o. being followed at [facility] for complaint of chronic pain localized to her shoulders and abdomen.      Patient was last seen in December, at  which point the patient reported not doing well the past 4-5 days. Had been having a lot of trouble with diet and feeding. She lost about 10 lbs in about 1.5 weeks and reported frequent bathroom trips (mostly stool)  about 15 times over night. She was unsure what triggered this episode. She did have stress of school with classes. She also had not been sleeping the past few days due to the frequent bathroom trips. For her pain management, she had been taking extra oxycodone, she had been taking about 4 oxycodone pills per day max. She was previously doing well and went to [place] to step away from her regular environment and had a great time without needing extra pain medications. She was able to push herself to do more and she is very proud  of this. As for medications, she had been doing well and was not using oxycodone more than 3 times a day, and was only needing to take suboxone once a day.     Since last visit, the patient has followed with Hem Onc and Gastroenterology. The patient was admitted to the hospital from 02/26/23 - 03/20/23 for chronic pain due to neoplasm. The patient was admitted to the hospital from 03/25/23 - 04/11/23 for intractable abdominal pain and later admitted frrom 04/11/23 - 05/01/23 for pouchitis. Patient was later admitted from 05/02/23 - 05/07/23 for desmoid tumor.     Today, Janari needed to have a virtual visit due to severe motion sickness while in the car and continued abdominal pain and increased stool frequency all in the setting of recent hospital discharge as noted above.  She states that she continues to experience what she describes as GI bleeding and now notes occasional black and tarry stool as well as leaking of blood that appears persistent.  She continues to also experience severe abdominal pain noting that she is often as high as a 9/10 in severity all in the context of continued, subjective abdominal swelling up to the rib cage.    She also notes that she continues to experience burning of the stomach with most p.o. intake including pills and has to use her NG tube to take her liquid oxycodone.  She has tried her oral oxycodone a few times but notes that it appears to resulted in no efficacy despite taking 20 mg at a time and continues to irritate her stomach and ways that does not occur when she uses her liquid oxycodone through her NG tube.    She notes that she has been very dizzy and especially carsick despite scopolamine patch.  Otherwise, is still having consistent bowel movements that still remains somewhat watery despite C. difficile treatment.    She notes that her pain appeared best controlled when she was on her chemotherapy but, having been off of it for 2 months now, feels that her pain is getting worse and is driven by her desmoid tumors that are intra-abdominally located.    She is  sleeping better during the night and though she is taking her oxycodone every 4 hours while awake if she is only taking up to 5 doses per day instead of the available 6, due to staying asleep consistently overnight.    She notes that she still has some liquid oxycodone leftover from her last discharge several months ago but will require additional liquid oxycodone to make it to our 2-week follow-up where I hope to begin weaning her dose.  She was open to attempting self weans in the interim.  She also notes the need for more Butrans, having only 1 patch at this time.  She has 88 oxycodone 10 mg pills but again is unable to tolerate these well.  She was discharged without baclofen and requests refill today.    Current analgesic regimen:  -Butrans patch 20 mcg/h  -Oxycodone 20 mg q4h PRN liquid  -P.o. Valium 5 mg nightly as needed  -Tylenol PRN  -Voltaren 1% gel    The patient states her pain is located abdomen and the severity of her pain ranges from 6/10 to 9/10.  Her pain currently is 8/10 and on average is 7/10.  She describes the sensation of her pain as aching, dull. Her pain is present all of the time and worst mornings, during the day, evenings. The patient???s pain impacts enjoyment of life, general activity, mood, normal work, recreational activities, relationships, sleep, walking, sitting, standing. Her interval history includes hospitalization. Her pain has stayed the same, and she does not have new pain to discuss today. She is on blood thinners or anti-coagulants. In regards to medications currently taken for pain management, the patient is tolerating these medications well and complains of associated side effects: dizziness, nausea/vomiting.    Previous Medication Trials:  NSAIDS: Toradol (c/f GIB after)  Antidepressant/anxiety: Cymbalta (stopped due to mood side effects), amitriptyline (insomnia and increased J-pouch output)  Anticonvulsants: Gabapentin, Lyrica (both cause lower extremity edema)  Muscle relaxants: Robaxin, baclofen (most effective of the conventional therapies), p.o. Valium (attempting to limit due to opioid dependence)  Topicals: lidocaine, voltaren gel  Short-acting opiates: oxycodone, PO HM  Long-acting opiates: Buprenorphine  Other: Tylenol    Previous Interventions  Procedures:   - TPIs in August 2023 with initial pain flare, subsequent improvement, and near immediate relief flare of pain; hyperalgesic with procedure  - TPIs of Right trapezius, rhomboid, infraspinatus on 12/04/2021.  - TPIs on 10/25  - Celiac plexus block on 11/1 (severe post-procedural pain flare requiring admission for pain control, significant hyperalgesia)  - TPIs 11/22    Infusions:  Lidocaine infusions helped during inpatient exacerbations of pain more than ketamine infusions    PT: Yes    Allergies  Allergies   Allergen Reactions    Adhesive Tape-Silicones Hives and Rash     Paper tape and Tegederm ok. Biopatch causes blistering but has tolerated CHG Tegaderm    Ferrlecit [Sodium Ferric Gluconat-Sucrose] Swelling and Rash    Levofloxacin Swelling and Rash     Swelling in mouth, rash,     Methylnaltrexone      Per Patient: I lost bowel control, severe abdominal cramping, and elevated BP    Neomycin Swelling     Rxn after ear drops; ear swelling    Papaya Hives    Morphine Nausea And Vomiting    Zosyn [Piperacillin-Tazobactam] Itching and Rash     Red and itchy    Compazine [Prochlorperazine] Other (See Comments)     Extreme agitation  Iron Analogues     Reglan [Metoclopramide Hcl] Diarrhea    Iron Dextran Itching     Received iron dextran 06/08/22 over 12 hours, had itching and redness/flushing during the infusion and for a couple days after. Required IV benadryl w/flares between doses and ultimately treated w/IV methylpred for 2 days.     Latex, Natural Rubber Rash       Home Medications    Current Outpatient Medications   Medication Sig Dispense Refill    acetaminophen (TYLENOL EXTRA STRENGTH) 500 MG tablet Take 2 tablets (1,000 mg total) by mouth every eight (8) hours.      apixaban (ELIQUIS) 5 mg Tab Take 1 tablet (5 mg total) by mouth two (2) times a day. 60 tablet 2    baclofen (LIORESAL) 10 MG tablet Take 0.5 tablets (5 mg total) by mouth Three (3) times a day. 45 tablet 2    buprenorphine (BUTRANS) 20 mcg/hour PTWK transdermal patch Place 1 patch on the skin once a week. 4 patch 0    escitalopram oxalate (LEXAPRO) 5 MG tablet Take 1 tablet (5 mg total) by mouth daily.      famotidine (PEPCID) 20 MG tablet Take 1 tablet (20 mg total) by mouth nightly. 30 tablet 0    fluticasone propionate (FLONASE) 50 mcg/actuation nasal spray Use 1 spray under the butrans patch once every 7 days to prevent allergic reaction to the adhesive      lidocaine (ASPERCREME) 4 % patch Place 1 patch on the skin daily. Apply for 12 hours then remove for 12 hours. 30 patch 0    LORazepam (ATIVAN) 1 MG tablet Take 1 tablet (1 mg total) by mouth two (2) times a day as needed for anxiety.      multivitamin with folic acid 400 mcg Tab tablet Take 1 tablet by mouth daily.      naloxegol (MOVANTIK) 12.5 mg tablet Take 1 tablet (12.5 mg total) by mouth daily as needed (constipation).      oxyCODONE (ROXICODONE) 5 mg/5 mL solution Take 20 mL (20 mg total) by mouth every four (4) hours as needed for pain for up to 7 days. Fill on or after: 05/13/23 720 mL 0    pantoprazole (PROTONIX) 40 MG tablet Take 1 tablet (40 mg total) by mouth daily. 30 tablet 0    promethazine (PHENERGAN) 12.5 MG tablet Take 1 tablet (12.5 mg total) by mouth every six (6) hours as needed for nausea.      scopolamine (TRANSDERM-SCOP) 1 mg over 3 days Place 1 patch (1 mg total) on the skin every third day.      vancomycin (FIRVANQ) 50 mg/mL SolR oral solution Take 2.5 mL (125 mg total) by mouth every six (6) hours for 6 days. Discard remaining medication. 150 mL 0     No current facility-administered medications for this visit.       Review Of Systems  General weight loss, daytime drowsiness  Cardiovascular none  Gastrointestinal bloody stool/black stool, nausea, and stomach pain  Skin hives  Endocrine increased sweating  Musculoskeletal spasms/spacticity/cramps, muscle aches/weakness, muscle wasting  Neurologic dizziness/vertigo, tremors  Psychiatric depression, anxiety        Physical Exam    VITALS:   There were no vitals filed for this visit.            Wt Readings from Last 3 Encounters:   05/03/23 55.9 kg (123 lb 3.8 oz)   04/06/23 54.4 kg (120 lb) 03/24/23 52.8 kg (116  lb 6.4 oz)     GENERAL:  The patient is well developed, well-nourished and appears to be in no apparent distress. The patient is pleasant and interactive. Patient is a good historian.  HEENT:    Reveals normocephalic/atraumatic. Clear sclera. Mucous membranes are moist.  RESPIRATORY:   Normal work of breathing.  No supplemental oxygen.  GASTROINTESTINAL:   Unable to assess given the virtual nature of this visit  NEUROLOGIC:    The patient was alert and oriented, speech fluent, normal language.   MUSCULOSKELETAL:    Unable to assess given the virtual nature of this visit  SKIN:   No obvious rashes, lesions, or erythema clearly visible.  PSYCHIATRIC:  Appropriate mood and affect.  No pressured speech.    Nat Christen, MD  Assistant Professor of Anesthesiology  Department of Anesthesiology   Divisions of General Anesthesiology and Pain Medicine

## 2023-05-12 NOTE — Transitions of Care (Post Inpatient/ED Visit) (Signed)
   05/12/2023  Name: Lisa Donovan MRN: [id number] DOB: March 20, 2000  Today's TOC FU Call Status: Today's TOC FU Call Status:: Unsuccessful Call (2nd Attempt) Unsuccessful Call (2nd Attempt) Date: 05/12/23  Attempted to reach the patient regarding the most recent Inpatient/ED visit.  Follow Up Plan: Additional outreach attempts will be made to reach the patient to complete the Transitions of Care (Post Inpatient/ED visit) call.   Gean Maidens BSN RN [facility] Management Coordinator [email or web address] Direct Dial: [phone]  Fax: [phone] Website: [email or web address]

## 2023-05-13 ENCOUNTER — Encounter
Admit: 2023-05-13 | Discharge: 2023-05-14 | Payer: BLUE CROSS/BLUE SHIELD | Attending: Student in an Organized Health Care Education/Training Program | Primary: Student in an Organized Health Care Education/Training Program

## 2023-05-13 DIAGNOSIS — R1084 Generalized abdominal pain: Principal | ICD-10-CM

## 2023-05-13 DIAGNOSIS — D48119 Desmoid tumor: Principal | ICD-10-CM

## 2023-05-13 DIAGNOSIS — R198 Other specified symptoms and signs involving the digestive system and abdomen: Principal | ICD-10-CM

## 2023-05-13 DIAGNOSIS — G893 Neoplasm related pain (acute) (chronic): Principal | ICD-10-CM

## 2023-05-13 DIAGNOSIS — M7918 Myalgia, other site: Principal | ICD-10-CM

## 2023-05-13 DIAGNOSIS — F1190 Opioid use, unspecified, uncomplicated: Principal | ICD-10-CM

## 2023-05-13 DIAGNOSIS — R208 Other disturbances of skin sensation: Principal | ICD-10-CM

## 2023-05-13 DIAGNOSIS — G894 Chronic pain syndrome: Principal | ICD-10-CM

## 2023-05-13 MED ORDER — BUPRENORPHINE 20 MCG/HOUR WEEKLY TRANSDERMAL PATCH
MEDICATED_PATCH | TRANSDERMAL | 0 refills | 28.00 days | Status: CP
Start: 2023-05-13 — End: ?

## 2023-05-13 MED ORDER — BACLOFEN 10 MG TABLET
ORAL_TABLET | Freq: Three times a day (TID) | ORAL | 2 refills | 30.00 days | Status: CP
Start: 2023-05-13 — End: 2023-08-11

## 2023-05-13 MED ORDER — OXYCODONE 5 MG/5 ML ORAL SOLUTION
ORAL | 0 refills | 6.00 days | Status: CP | PRN
Start: 2023-05-13 — End: 2023-05-20

## 2023-05-13 NOTE — Unmapped (Addendum)
 It was good to see you [name] [name]!     We will have you continue your oxycodone liquid 20 mg every 4 hours as needed while you are awake.  I am glad to see that you are only using on average 5 doses per day though.    I will send in another refill of your oxycodone in order to get you to your next clinic visit with me in 2 weeks.  I will also send in a refill for 1 month supply of your Butrans patches and several months of your baclofen.

## 2023-05-14 ENCOUNTER — Telehealth: Payer: Self-pay | Admitting: *Deleted

## 2023-05-14 ENCOUNTER — Encounter: Payer: Self-pay | Admitting: *Deleted

## 2023-05-14 DIAGNOSIS — D48119 Desmoid tumor: Principal | ICD-10-CM

## 2023-05-14 MED ORDER — CREON 24,000-76,000-120,000 UNIT CAPSULE,DELAYED RELEASE
ORAL_CAPSULE | 1 refills | 0.00 days | Status: CP
Start: 2023-05-14 — End: 2023-05-14

## 2023-05-14 NOTE — Unmapped (Signed)
 Submitted urgent PA for oxycodone HCL 5mg /25mL SOLN using CoverMyMeds:     Marketing executive (Key: [id number])    [employer] [place] has not yet replied to your PA request. You may close this dialog, return to your dashboard, and perform other tasks.  To check for an update later, open this request again from your dashboard.  If [employer] [place] has not replied to your request within 24 hours for an urgent request or 72 hours for a standard request, please contact [employer] [place] at [phone] option 3, then option 3 again.

## 2023-05-14 NOTE — Unmapped (Addendum)
 ePA approved for oxycodone HCL 5mg /31mL soln  Pharmacy notified by fax  Patient notified by Mychart message  Approval good from 05/14/23 - 05/13/24  Approval letter to be scanned to media tab

## 2023-05-14 NOTE — Patient Outreach (Signed)
 Erroneous Encounter- disregard  Irving Shows [facility], BSN RN Care Manager/ Transition of Care [place]/ [facility] [phone]

## 2023-05-14 NOTE — Transitions of Care (Post Inpatient/ED Visit) (Signed)
   05/14/2023  Name: Lisa Donovan MRN: [id number] DOB: 05-04-2000  Today's TOC FU Call Status: Today's TOC FU Call Status:: Unsuccessful Call (3rd Attempt) Unsuccessful Call (3rd Attempt) Date: 05/14/23  Attempted to reach the patient regarding the most recent Inpatient/ED visit.  Follow Up Plan: No further outreach attempts will be made at this time. We have been unable to contact the patient.  Irving Shows [facility], BSN RN Care Manager/ Transition of Care [place]/ [facility] [phone]

## 2023-05-17 DIAGNOSIS — D48119 Desmoid tumor: Principal | ICD-10-CM

## 2023-05-18 DIAGNOSIS — D500 Iron deficiency anemia secondary to blood loss (chronic): Principal | ICD-10-CM

## 2023-05-19 ENCOUNTER — Inpatient Hospital Stay: Admit: 2023-05-19 | Discharge: 2023-05-20 | Payer: BLUE CROSS/BLUE SHIELD

## 2023-05-19 ENCOUNTER — Ambulatory Visit: Admit: 2023-05-19 | Discharge: 2023-05-20 | Payer: BLUE CROSS/BLUE SHIELD

## 2023-05-19 ENCOUNTER — Other Ambulatory Visit: Admit: 2023-05-19 | Discharge: 2023-05-20 | Payer: BLUE CROSS/BLUE SHIELD

## 2023-05-19 DIAGNOSIS — I82A11 Acute embolism and thrombosis of right axillary vein: Principal | ICD-10-CM

## 2023-05-19 DIAGNOSIS — D48119 Desmoid tumor: Principal | ICD-10-CM

## 2023-05-19 DIAGNOSIS — D500 Iron deficiency anemia secondary to blood loss (chronic): Principal | ICD-10-CM

## 2023-05-19 DIAGNOSIS — Z79899 Other long term (current) drug therapy: Principal | ICD-10-CM

## 2023-05-19 DIAGNOSIS — Q8789 Other specified congenital malformation syndromes, not elsewhere classified: Principal | ICD-10-CM

## 2023-05-19 LAB — IRON & TIBC
IRON SATURATION: 8 % — ABNORMAL LOW (ref 20–55)
IRON: 22 ug/dL — ABNORMAL LOW (ref 50–170)
TOTAL IRON BINDING CAPACITY: 281 ug/dL (ref 250–425)

## 2023-05-19 LAB — CBC W/ AUTO DIFF
BASOPHILS ABSOLUTE COUNT: 0 10*9/L (ref 0.0–0.1)
BASOPHILS RELATIVE PERCENT: 0.2 %
EOSINOPHILS ABSOLUTE COUNT: 0.1 10*9/L (ref 0.0–0.5)
EOSINOPHILS RELATIVE PERCENT: 1.4 %
HEMATOCRIT: 39.5 % (ref 34.0–44.0)
HEMOGLOBIN: 13.3 g/dL (ref 11.3–14.9)
LYMPHOCYTES ABSOLUTE COUNT: 1.9 10*9/L (ref 1.1–3.6)
LYMPHOCYTES RELATIVE PERCENT: 18.7 %
MEAN CORPUSCULAR HEMOGLOBIN CONC: 33.7 g/dL (ref 32.0–36.0)
MEAN CORPUSCULAR HEMOGLOBIN: 28.9 pg (ref 25.9–32.4)
MEAN CORPUSCULAR VOLUME: 85.7 fL (ref 77.6–95.7)
MEAN PLATELET VOLUME: 9.6 fL (ref 6.8–10.7)
MONOCYTES ABSOLUTE COUNT: 0.7 10*9/L (ref 0.3–0.8)
MONOCYTES RELATIVE PERCENT: 7.1 %
NEUTROPHILS ABSOLUTE COUNT: 7.2 10*9/L (ref 1.8–7.8)
NEUTROPHILS RELATIVE PERCENT: 72.6 %
PLATELET COUNT: 312 10*9/L (ref 150–450)
RED BLOOD CELL COUNT: 4.61 10*12/L (ref 3.95–5.13)
RED CELL DISTRIBUTION WIDTH: 14.8 % (ref 12.2–15.2)
WBC ADJUSTED: 9.9 10*9/L (ref 3.6–11.2)

## 2023-05-19 LAB — FERRITIN: FERRITIN: 28.7 ng/mL (ref 7.3–270.7)

## 2023-05-19 LAB — C-REACTIVE PROTEIN: C-REACTIVE PROTEIN: 9.9 mg/L (ref <=10.0)

## 2023-05-19 LAB — SEDIMENTATION RATE: ERYTHROCYTE SEDIMENTATION RATE: 13 mm/h (ref 0–20)

## 2023-05-19 LAB — RETICULOCYTES
RETICULOCYTE ABSOLUTE COUNT: 42.4 10*9/L (ref 23.0–100.0)
RETICULOCYTE COUNT PCT: 0.92 % (ref 0.50–2.17)

## 2023-05-19 MED ADMIN — sodium chloride (NS) 0.9 % infusion: 1000 mL/h | INTRAVENOUS | @ 18:00:00 | Stop: 2023-05-19

## 2023-05-19 NOTE — Unmapped (Signed)
 [facility] BONE AND SOFT TISSUE ONCOLOGY RETURN VISIT    Encounter Date: 05/19/2023  Patient Name: [name] [name]  Medical Record Number: [id number]    Referring Physician: Sheral Apley, MD  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    Desmoid fibromatosis, history of [name]     She has had numerous sites of disease, including paraspinal, chest wall, right arm/wrist. She has had several cryoablations with Dr. Cathie Hoops which have seemed to offer clinical benefit. She trialed sorafenib which seemed to lead to shrinkage of lesions, but she had significant intolerance with dizziness, presyncope, fatigue, hair loss, rash, GI side effects.      We have discussed at length the nature of desmoid fibromatosis tumors, the fact they can wax and wane independent of therapy, are not cancers in a metastatic potential, but can be locally infiltrative and cause substantial morbidity to the site of disease and occasionally mortality. I reviewed that the emerging standard of care is observation based on the fact that 20-30% will experience spontaneous regression in the year following diagnosis. Given her long experience with desmoids, complete regression may be less likely, but we will still approach this cautiously with parallel goals of avoidance of overtreatment (particularly surgical) and palliation of symptoms / preservation of function.     At first visit in 2022, her lesions are in the shoulder, right forearm, chest area, with a possible desmoid in her left thigh. Since then, she has had waxing waning course with significant pain issues, malnutrition requiring intermittent TPN and tube feeds, multiple GI bleeds, significant anxiety, depression, multiple hospitalizations. Had tumor response to sorafenib but developed GI bleeding and had to stop. IV iron infusion led to severe anaphylaxis.    09/16/22: Start nirogacestat.     09/25/22: Admitted with uncontrolled pain and muscle spasms after IR guided procedure. Prolonged hospital stay, neurologic evaluation, managed with muscle relaxers (baclofen, valium). Discharge 9/11 to rehab. Ramped up on nirogacestat with some side effects, discharged from rehab 10/2.    12/16/22: Tolerating nirogacestat reasonably well. Dealing with hot flashes, edema, nausea, muscle spasticity, anxiety, pain. Resumed mirtazepine and movantik for nausea, appetite, constipation. Resumed lexapro 5mg  for hot flashes and mood symptoms.    02/24/23: Continue nirogacestat. Upcoming MRI. Recent GI bleeding. Ongoing severe pain, anxiety.    12/29-1/10: Admission for severe pain, increase in BMs, hematochezia. Found to have pouchitis, treated with cefdinir. Frustrations with inpatient pain control. Received IV iron for iron deficiency.    03/25/23 - 05/07/23: Prolonged, complex admission for pain, malnutrition. Found to have strep mitis blood culture. NG tube placed, TPN not recommended. Discharged to rehab, but then readmitted with C Diff, treated with C Diff.    05/19/23:  Assessment & Plan  Desmoid Tumors  She has desmoid fibromatosis with multiple mesenteric and extremity implants. Previously on nirogacestat, which helped with pain management. Concerns about new growths and worsening symptoms, including pain and potential new lesions. She has been off nirogacestat due to recent gastrointestinal issues and hospitalization. The plan is to resume nirogacestat cautiously due to ongoing gastrointestinal issues and potential exacerbation of symptoms.  - Start nirogacestat at a low dose of 50 mg twice daily after discontinuation of anticoagulants.  - Order MRI of chest and lower extremities  for repeat staging.    Gastrointestinal Bleeding  Reports significant gastrointestinal bleeding, likely exacerbated by anticoagulants and recent Clostridioides difficile colitis. The bleeding is reported as severe, with reports of passing large amounts of blood. Experiencing significant pain and has stopped enteral feeds due to increased bleeding. Fortunately, Hgb normal. The plan is to discontinue anticoagulants if the clot is resolved to prevent further gastrointestinal bleeding.  - Coordinate with Mitchell Heir to discontinue anticoagulants if the clot is resolved.  - Provide IV fluids to address dehydration and assess hemoglobin levels.  - Monitor CBC and iron studies to evaluate for anemia.  - Consider aminocaproic acid or tranexamic acid if bleeding persists after discontinuation of anticoagulants.    Clostridioides difficile Colitis  Recently hospitalized for Clostridioides difficile colitis, contributing to gastrointestinal symptoms and bleeding. Completed a course of oral vancomycin.  - Monitor for resolution of symptoms post-vancomycin treatment.    Deep Vein Thrombosis (DVT)  Developed a DVT secondary to a peripherally inserted central catheter line. On anticoagulants for over a month. Plan to discontinue anticoagulants if the clot is resolved to prevent further gastrointestinal bleeding. The decision to stop anticoagulants is contingent on the resolution of the clot, as above.    Malnutrition  Malnutrition requiring enteral and parenteral nutrition. Recent gastrointestinal bleeding and colitis have exacerbated her nutritional status, and she has stopped enteral feeds due to increased bleeding. The plan is to resume enteral feeds cautiously after stabilization of gastrointestinal bleeding.  - Resume enteral feeds cautiously after stabilization of gastrointestinal bleeding.  - Provide nutritional support and monitor nutritional status.    Pain Syndrome  Experiences significant pain, which has worsened recently. Currently on oxycodone 20 mg at home, increased from 10 mg twice a day prior to hospitalization. Pain is affecting her daily activities and sleep. The plan is to manage pain with current oxycodone regimen and evaluate pain management strategies in conjunction with resuming nirogacestat.  - Manage pain with current oxycodone regimen.  - Evaluate pain management strategies in conjunction with resuming nirogacestat.    Anxiety and Depression  Anxiety and PTSD, exacerbated by her medical condition and recent hospitalizations. Working with Dr. Austin Miles for support.  - Continue psychiatric support with Dr. Austin Miles.    Follow-up  Requires close follow-up to manage her complex medical conditions and recent changes in her treatment plan.  - Schedule follow-up appointment in one month.  - Coordinate with interventional radiology for a consultation regarding potential port placement.  - Communicate with Dr. [name] regarding gastrointestinal bleeding and potential interventions.    I personally reviewed the medical records, pathology and laboratory results and viewed the imaging. All questions were answered to the patient's apparent satisfaction and they voiced understanding and agreement with the plan. Pt has my card and contact information and is encouraged to reach out with any further questions.    Donzetta Sprung, MD/MPH  Assistant Professor  Bone and Soft Tissue Oncology Program  [facility]  Pager: [phone]  Nurse Navigator: Roseanne Reno RN, BSN, OCN      REASON FOR CONSULTATION:   [name] [name] is a 23 y.o. female who is seen in consultation at the request of [name] Jonita Albee, MD for evaluation of her desmoid fibromatosis    HISTORY OF PRESENT ILLNESS:      Attends school at [employer] in [place], [place]      Right arm desmoid is the most bothersome - significant arm cramping, deep aching pain, lasts for ~30 min after cramping  starts. Has noticed right hand shaking frequently. 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     Left chest lesion used to be quite uncomfortable, caused swelling in her arm and discomfort. Improved after cryoablation.     Right shoulder blade hurts when she leans back against it, fairly painful. Right chest wall lesion has some associated pain, improving since cryo.     First diagnosed at 23 years old - seen in [place] at first, had numerous cysts. First major issues were two paraspinal lesions, referred to [facility] for biopsy and ultimately resection.  Soon diagnosed with [name], seen by medical genetics early on.  Guided by Dr. Debbe Mounts, referred out to proceduralists as needed.  2011 - had a growing lesion on her back as well as bilateral neck lesions, resected at [facility], found to be desmoid  02/2012 - surgery for thoracic spine desmoid. Maybe another flank lesion removed  2017 - wrist lesion treated by VIR with bupivicaine, kenalog  08/2016 - endoscopy with adenomatous polyp of the colon, non-dysplastic  02/2017 - subcutaneous lesion resection by plastic surgery (postauricular and RUE, RLE) - path revealed epidermal inclusion cysts, osteomas  05/2018: cryotherapy of left anterior chest wall lesion  05/2018: started sulindac but struggled with adherence due to GI side effects  08/2018: cryo #2 to chest wall lesion  12/10/2018: started sorafenib due to progression in left paraspinal lesion, ?posterior chest wall lesion  02/2019: discontinued sorafenib for several reasons: fatigue, dizziness, nausea, hair loss, as well as COVID infection in late November. Derm consulted for rash / hair loss, started on antihistamines, topical steroids. Trialed lower dose but ultimately stopped again 05/2019 due to toxicity.  06/2019: follow up with surgical oncology - plans for observation rather than repeat surgery  07/2019: cryoablation of anterior chest wall desmoid  07/2019: excision of subcutaneous LE mass and L neck masses, clinically appeared consistent with cysts  08/2019: cryoablation of R posterior chest wall, left paraspinal desmoid     Interval History:  History of Present Illness  The patient is a 23 year old with familial adenomatous polyposis ([name]) who presents for follow-up after recent hospitalization for pain, malnutrition, bloodstream infection, and C. difficile colitis.    She has a complex medical history of [name] and is status post colectomy. She follows up for desmoid fibromatosis, with multiple mesenteric and extremity implants, and was most recently on nirogacestat. Her history of malnutrition intermittently requires enteral and parenteral nutrition.    She was recently hospitalized for a prolonged period due to pain, malnutrition, a bloodstream infection, and C. difficile colitis. During the hospital stay, she experienced significant challenges, including delays in receiving nutritional feeds, which led to her glucose dropping to 60, and issues with intravenous access due to vein damage. She was treated with vancomycin but experienced allergic reactions and complications from a PICC line, which resulted in a DVT. Her pain was not well managed initially, and she had to advocate for herself to receive appropriate care.    She has ongoing gastrointestinal bleeding, describing 'toilet bowls full of blood' and increased abdominal pain. She experiences significant pain around her tailbone and buttocks, which is 'unbearable' and affects her sleep. She has stopped her feeds for the last two days due to increased bleeding and irritation.    She has been experiencing new symptoms, including motion sickness and hives, which she describes as 'random' and not associated with any particular trigger. She is currently on 20 mg of oxycodone at home,  increased from 10 mg twice a day before her hospitalization. She is concerned about managing her pain as she prepares to return to nursing school.    She has been experiencing new growths, including a cyst on her collarbone and thickening in her armpit, which she describes as 'popping up really, really fast.' She has a history of desmoid tumors and is concerned about these new developments.    REVIEW OF SYSTEMS:  A comprehensive review of 12 systems was negative except for pertinent positives noted in HPI.    Past Medical History, Surgical History, Family History were reviewed personally. Any changes were updated as above.    Social History:  Attended [employer] in [place], has been interrupted by health issues  Wants to be a Engineer, civil (consulting) in pediatric hematology/oncology   Mother Natalia Leatherwood is a strong advocate, often present at visits    Pregnancy status: premenopausal, interested in future children    ALLERGIES/MEDICATIONS:  Reviewed in Epic    PHYSICAL EXAM:   BP 121/72  - Pulse 91  - Temp 36.8 ??C (98.2 ??F)  - Ht 165.1 cm (5' 5)  - Wt 55.6 kg (122 lb 8 oz)  - SpO2 97%  - BMI 20.39 kg/m??   ECOG 2  Gen - well appearing, mother accompanying her   Eyes - conjunctivae clear, PERRL  ENT -no scleral icterus, moist mucous membranes  Lymph - no cervical or supraclavicular lymphadenopathy appreciated  Resp - clear to auscultation bilaterally, no wheezes, crackles or rales  CV - normal rate, regular rhythm, no lower extremity edema noted  Skin - flushing of skin noted on chest  Neuro - AOx3, EOM intact, no facial droop, speech fluent and coherent, moves all extremities without asymmetry  Psych - affect anxious  MSK - no joint tenderness or swelling    PATHOLOGY:   Pathology was personally reviewed as described in the HPI, detailed in Epic.     LABS:  Reviewed in Epic    RADIOLOGY:  Imaging was personally viewed and interpreted as summarized in the history (see above).

## 2023-05-19 NOTE — Unmapped (Signed)
 Patient arrived to unit with no s/s of distress.  Patient verbalized no complaints.  Infusion competed without adverse events.  Patient stable at time of discharge.  Patient ambulated out of unit with family member and without difficulty

## 2023-05-19 NOTE — Unmapped (Signed)
 Lab on 05/19/2023   Component Date Value Ref Range Status    Reticulocyte Auto % 05/19/2023 0.92  0.50 - 2.17 % Final    Absolute Auto Reticulocyte 05/19/2023 42.4  23.0 - 100.0 10*9/L Final    Iron 05/19/2023 22 (L)  50 - 170 ug/dL Final    TIBC 16/12/9602 281  250 - 425 ug/dL Final    Iron Saturation (%) 05/19/2023 8 (L)  20 - 55 % Final    Ferritin 05/19/2023 28.7  7.3 - 270.7 ng/mL Final    Sed Rate 05/19/2023 13  0 - 20 mm/h Final    CRP 05/19/2023 9.9  <=10.0 mg/L Final    WBC 05/19/2023 9.9  3.6 - 11.2 10*9/L Final    RBC 05/19/2023 4.61  3.95 - 5.13 10*12/L Final    HGB 05/19/2023 13.3  11.3 - 14.9 g/dL Final    HCT 54/11/8117 39.5  34.0 - 44.0 % Final    MCV 05/19/2023 85.7  77.6 - 95.7 fL Final    MCH 05/19/2023 28.9  25.9 - 32.4 pg Final    MCHC 05/19/2023 33.7  32.0 - 36.0 g/dL Final    RDW 14/78/2956 14.8  12.2 - 15.2 % Final    MPV 05/19/2023 9.6  6.8 - 10.7 fL Final    Platelet 05/19/2023 312  150 - 450 10*9/L Final    Neutrophils % 05/19/2023 72.6  % Final    Lymphocytes % 05/19/2023 18.7  % Final    Monocytes % 05/19/2023 7.1  % Final    Eosinophils % 05/19/2023 1.4  % Final    Basophils % 05/19/2023 0.2  % Final    Absolute Neutrophils 05/19/2023 7.2  1.8 - 7.8 10*9/L Final    Absolute Lymphocytes 05/19/2023 1.9  1.1 - 3.6 10*9/L Final    Absolute Monocytes 05/19/2023 0.7  0.3 - 0.8 10*9/L Final    Absolute Eosinophils 05/19/2023 0.1  0.0 - 0.5 10*9/L Final    Absolute Basophils 05/19/2023 0.0  0.0 - 0.1 10*9/L Final

## 2023-05-20 ENCOUNTER — Encounter: Payer: Self-pay | Admitting: Physician Assistant

## 2023-05-20 ENCOUNTER — Ambulatory Visit (INDEPENDENT_AMBULATORY_CARE_PROVIDER_SITE_OTHER): Admitting: Physician Assistant

## 2023-05-20 VITALS — BP 98/60 | HR 76 | Temp 98.4°F | Ht 65.0 in | Wt 125.0 lb

## 2023-05-20 DIAGNOSIS — D485 Neoplasm of uncertain behavior of skin: Secondary | ICD-10-CM

## 2023-05-20 DIAGNOSIS — Z23 Encounter for immunization: Secondary | ICD-10-CM

## 2023-05-20 DIAGNOSIS — R7989 Other specified abnormal findings of blood chemistry: Secondary | ICD-10-CM | POA: Diagnosis not present

## 2023-05-20 DIAGNOSIS — Z111 Encounter for screening for respiratory tuberculosis: Secondary | ICD-10-CM | POA: Diagnosis not present

## 2023-05-20 DIAGNOSIS — T753XXA Motion sickness, initial encounter: Secondary | ICD-10-CM

## 2023-05-20 DIAGNOSIS — R946 Abnormal results of thyroid function studies: Secondary | ICD-10-CM | POA: Diagnosis not present

## 2023-05-20 DIAGNOSIS — L608 Other nail disorders: Secondary | ICD-10-CM

## 2023-05-20 DIAGNOSIS — F32A Depression, unspecified: Secondary | ICD-10-CM

## 2023-05-20 DIAGNOSIS — F419 Anxiety disorder, unspecified: Secondary | ICD-10-CM

## 2023-05-20 DIAGNOSIS — Z0000 Encounter for general adult medical examination without abnormal findings: Secondary | ICD-10-CM

## 2023-05-20 LAB — COMPREHENSIVE METABOLIC PANEL
ALT: 14 U/L (ref 0–35)
AST: 14 U/L (ref 0–37)
Albumin: 4.3 g/dL (ref 3.5–5.2)
Alkaline Phosphatase: 51 U/L (ref 39–117)
BUN: 8 mg/dL (ref 6–23)
CO2: 30 meq/L (ref 19–32)
Calcium: 9.6 mg/dL (ref 8.4–10.5)
Chloride: 104 meq/L (ref 96–112)
Creatinine, Ser: 0.51 mg/dL (ref 0.40–1.20)
GFR: 131.53 mL/min (ref >60.00)
Glucose, Bld: 90 mg/dL (ref 70–99)
Potassium: 4.6 meq/L (ref 3.5–5.1)
Sodium: 139 meq/L (ref 135–145)
Total Bilirubin: 0.3 mg/dL (ref 0.2–1.2)
Total Protein: 6.6 g/dL (ref 6.0–8.3)

## 2023-05-20 LAB — CBC WITH DIFFERENTIAL/PLATELET
Basophils Absolute: 0 10*3/uL (ref 0.0–0.1)
Basophils Relative: 0.6 % (ref 0.0–3.0)
Eosinophils Absolute: 0.2 10*3/uL (ref 0.0–0.7)
Eosinophils Relative: 2.4 % (ref 0.0–5.0)
HCT: 37 % (ref 36.0–46.0)
Hemoglobin: 12.3 g/dL (ref 12.0–15.0)
Lymphocytes Relative: 28.4 % (ref 12.0–46.0)
Lymphs Abs: 2 10*3/uL (ref 0.7–4.0)
MCHC: 33.3 g/dL (ref 30.0–36.0)
MCV: 88.2 fl (ref 78.0–100.0)
Monocytes Absolute: 0.5 10*3/uL (ref 0.1–1.0)
Monocytes Relative: 7.6 % (ref 3.0–12.0)
Neutro Abs: 4.2 10*3/uL (ref 1.4–7.7)
Neutrophils Relative %: 61 % (ref 43.0–77.0)
Platelets: 310 10*3/uL (ref 150.0–400.0)
RBC: 4.19 Mil/uL (ref 3.87–5.11)
RDW: 14.4 % (ref 11.5–15.5)
WBC: 6.9 10*3/uL (ref 4.0–10.5)

## 2023-05-20 LAB — LIPID PANEL
Cholesterol: 125 mg/dL (ref 0–200)
HDL: 41.2 mg/dL (ref >39.00)
LDL Cholesterol: 70 mg/dL (ref 0–99)
NonHDL: 84.2
Total CHOL/HDL Ratio: 3
Triglycerides: 69 mg/dL (ref 0.0–149.0)
VLDL: 13.8 mg/dL (ref 0.0–40.0)

## 2023-05-20 LAB — TSH: TSH: 0.54 u[IU]/mL (ref 0.35–5.50)

## 2023-05-20 LAB — T3, FREE: T3, Free: 3 pg/mL (ref 2.3–4.2)

## 2023-05-20 LAB — T4, FREE: Free T4: 0.67 ng/dL (ref 0.60–1.60)

## 2023-05-20 MED ORDER — SCOPOLAMINE 1 MG/3DAYS TD PT72: 1.0000 | MEDICATED_PATCH | TRANSDERMAL | 2 refills | Status: AC

## 2023-05-20 NOTE — Progress Notes (Signed)
 Subjective:    Lisa Donovan is a 23 y.o. female and is here for a comprehensive physical exam.  HPI  Health Maintenance Due  Topic Date Due   Hepatitis C Screening  Never done   Needs TB test for school  Acute Concerns: Nausea  She complains of severe motion sickness when riding in a car.  Reports taking Phenergan 12.5 mg and Zofran 8 mg with minimal relief. She's also using scopolamine patches 1 mg which she wears daily to avoid her symptoms. She is requesting a refill today.  She states that taking phenergan causes a burning sensation in her throat and thus doesn't take as often as she would like too.  Wondering is she's experiencing vertigo. Associated symptoms include abdominal pain and bleeding but attributes that to her tumors growth.   Onychomadesis  Patient complains of weak and fragile toe nails that tend to just fall off. Theses tend to mostly happen with her bilateral hallux toes.  Chronic Issues: Chronic pain  Currently on Cymbalta 20 mg daily and Oxy 5mg  as needed BID  Anxiety  She was recently diagnosed with medical PTSD due to her recent long term hospitalization event and recent worsening health.  Continues to follow up with her therapist weekly.  Denies any suicidal thoughts at this time   [name]  Patient states that she developed muscle rigidity and lactic acidosis following a procedure.  The lactic acidosis caused her GI system to be paralyzed which caused further complications.   Currently, she's able to have solid food but states that she experiences significant discomfort when doing so.    Health Maintenance: Immunizations -- TB, flu vaccines today Colonoscopy -- N/A Mammogram -- N/A PAP -- N/A Bone Density -- N/A Diet -- restrictive diet due to her chronic conditions.  Exercise -- none  Sleep habits -- no complains  Mood -- increased anxiety due to medical concerns.   UTD with dentist? - yes UTD with eye doctor? - yes  Weight  history: Wt Readings from Last 10 Encounters:  05/20/23 125 lb (56.7 kg)  12/12/22 115 lb (52.2 kg)  11/15/21 115 lb (52.2 kg)  10/21/21 114 lb (51.7 kg)  02/26/21 112 lb 6.4 oz (51 kg)  12/12/20 117 lb (53.1 kg)  09/08/20 107 lb (48.5 kg)  11/09/19 115 lb (52.2 kg) (24%, Z= -0.71)*  07/25/19 112 lb 8 oz (51 kg) (20%, Z= -0.85)*  07/14/12 83 lb (37.6 kg) (19%, Z= -0.87)*   * Growth percentiles are based on CDC (Girls, 2-20 Years) data.   Body mass index is 20.8 kg/m. No LMP recorded. (Menstrual status: Chemotherapy).  Alcohol use:  reports no history of alcohol use.  Tobacco use:  Tobacco Use: Low Risk  (05/20/2023)   Patient History    Smoking Tobacco Use: Never    Smokeless Tobacco Use: Never    Passive Exposure: Not on file   Eligible for lung cancer screening? no     05/20/2023   11:59 AM  Depression screen PHQ 2/9  Decreased Interest 2  Down, Depressed, Hopeless 2  PHQ - 2 Score 4  Altered sleeping 3  Tired, decreased energy 3  Change in appetite 3  Feeling bad or failure about yourself  3  Trouble concentrating 0  Moving slowly or fidgety/restless 0  Suicidal thoughts 0  PHQ-9 Score 16  Difficult doing work/chores Very difficult     Other providers/specialists: Patient Care Team: Jarold Motto, [place] as PCP - General (Physician Assistant)  Sheral Apley, MD as PCP - Hematology/Oncology (Medical Oncology) Elenore Rota, Lavone Neri, MD as Referring Physician (Colon and Rectal Surgery) Britt Bolognese, MD as Referring Physician (Gastroenterology)    PMHx, SurgHx, SocialHx, Medications, and Allergies were reviewed in the Visit Navigator and updated as appropriate.   Past Medical History:  Diagnosis Date   Anemia    Colon polyps    Dx at age 36, has colonoscopy yearly   [name]    [name]    Dx at age 28   GERD (gastroesophageal reflux disease)    H/O colonoscopy    Pt has has them yearly   History of endoscopy    [facility])       Past Surgical History:  Procedure Laterality Date   APPENDECTOMY     COLOPROCTECTOMY W/ ILEO J POUCH     EVALUATION UNDER ANESTHESIA WITH ANAL FISTULECTOMY N/A 02/21/2021   Procedure: EXAM UNDER ANESTHESIA WITH ANAL FISTULECTOMY;  Surgeon: Harriette Bouillon, MD;  Location: [facility];  Service: General;  Laterality: N/A;   Oral surgery     Tumor removed     Numerous surgies since she was 23 months old    No family history on file.  Social History   Tobacco Use   Smoking status: Never   Smokeless tobacco: Never  Vaping Use   Vaping status: Never Used  Substance Use Topics   Alcohol use: Never   Drug use: Never    Review of Systems:   Review of Systems  Constitutional:  Negative for chills, fever, malaise/fatigue and weight loss.  HENT:  Negative for hearing loss, sinus pain and sore throat.   Respiratory:  Positive for shortness of breath. Negative for cough and hemoptysis.   Cardiovascular:  Negative for chest pain, palpitations, leg swelling and PND.  Gastrointestinal:  Positive for blood in stool and nausea. Negative for abdominal pain, constipation, diarrhea, heartburn and vomiting.  Genitourinary:  Negative for dysuria, frequency and urgency.  Musculoskeletal:  Negative for back pain, myalgias and neck pain.  Skin:  Negative for itching and rash.  Neurological:  Negative for dizziness, tingling, seizures and headaches.  Endo/Heme/Allergies:  Negative for polydipsia.  Psychiatric/Behavioral:  Negative for depression. The patient is not nervous/anxious.     Objective:   BP 98/60 (BP Location: Left Arm, Patient Position: Sitting, Cuff Size: Normal)   Pulse 76   Temp 98.4 F (36.9 C) (Temporal)   Ht 5\' 5"  (1.651 m)   Wt 125 lb (56.7 kg)   SpO2 98%   BMI 20.80 kg/m  Body mass index is 20.8 kg/m.   General Appearance:    Alert, cooperative, no distress, appears stated age  Head:    Normocephalic, without obvious abnormality, atraumatic  Eyes:    PERRL,  conjunctiva/corneas clear, EOM's intact, fundi    benign, both eyes  Ears:    Normal TM's and external ear canals, both ears  Nose:   Nares normal, septum midline, mucosa normal, no drainage    or sinus tenderness  Throat:   Lips, mucosa, and tongue normal; teeth and gums normal  Neck:   Supple, symmetrical, trachea midline, no adenopathy;    thyroid:  no enlargement/tenderness/nodules; no carotid   bruit or JVD  Back:     Symmetric, no curvature, ROM normal, no CVA tenderness  Lungs:     Clear to auscultation bilaterally, respirations unlabored  Chest Wall:    No tenderness or deformity   Heart:    Regular rate and  rhythm, S1 and S2 normal, no murmur, rub or gallop  Breast Exam:    Deferred  Abdomen:     Soft, non-tender, bowel sounds active all four quadrants,    no masses, no organomegaly  Genitalia:    Deferred  Extremities:   Extremities normal, atraumatic, no cyanosis or edema  Pulses:   2+ and symmetric all extremities  Skin:   Skin color, texture, turgor normal, no rashes or lesions  Lymph nodes:   Cervical, supraclavicular, and axillary nodes normal  Neurologic:   CNII-XII intact, normal strength, sensation and reflexes    throughout    Assessment/Plan:   Routine physical examination Today patient counseled on age appropriate routine health concerns for screening and prevention, each reviewed and up to date or declined. Immunizations reviewed and up to date or declined. Labs ordered and reviewed. Risk factors for depression reviewed and negative. Hearing function and visual acuity are intact. ADLs screened and addressed as needed. Functional ability and level of safety reviewed and appropriate. Education, counseling and referrals performed based on assessed risks today. Patient provided with a copy of personalized plan for preventive services.  Screening for tuberculosis Update today for school  Abnormal TSH Repeat TSH and advise accordingly  Encounter for  immunization Flu shot today  Motion sickness, initial encounter Continue scopolamine patches, phenergan and Zofran Follow up with specialists Has had multiple imaging methods; MRI brain on 8//2024 reviewed  Change in nail appearance Suspect Onychomadesis  Provided her with recommendations Follow up based on results - consider dermatology appt  Anxiety and depression As controlled as to be expected Ongoing management per psychiatry or psychology  [name] of skin Management per multiple specialists   Jarold Motto, PA-C [facility],acting as a Neurosurgeon for [employer], PA.,have documented all relevant documentation on the behalf of Jarold Motto, PA,as directed by  Jarold Motto, PA while in the presence of Jarold Motto, [place].   I, Jarold Motto, [place], have reviewed all documentation for this visit. The documentation on 05/20/23 for the exam, diagnosis, procedures, and orders are all accurate and complete.

## 2023-05-20 NOTE — Patient Instructions (Signed)
  Diagnosis: Onychomadesis    Nail Care:   Action: Cautiously trim the affected nails to prevent snagging. File the edges at home and cover with band-aids until fully grown out.   Vinegar Soak:   Instructions: Soak the affected finger in a half vinegar and half warm water solution for 5-10 minutes every other day. Rinse with water afterward.   Moisturizing:   Recommendation: Massage a little Vaseline into the cuticles and nails daily to promote blood flow and nail health. Alternatively, use the hand creams provided if Vaseline feels too greasy.   Supplements:   Suggestion: While not necessary, consider taking hair and nails vitamins or collagen supplements to support nail growth.   Follow-Up:   Next Steps: No immediate follow-up appointment is necessary. We will see you as needed to monitor the progress.

## 2023-05-21 ENCOUNTER — Encounter: Payer: Self-pay | Admitting: Physician Assistant

## 2023-05-23 LAB — QUANTIFERON-TB GOLD PLUS
Mitogen-NIL: 8.25 [IU]/mL
NIL: 0.02 [IU]/mL
QuantiFERON-TB Gold Plus: NEGATIVE
TB1-NIL: 0.01 [IU]/mL
TB2-NIL: 0 [IU]/mL

## 2023-06-02 NOTE — Unmapped (Addendum)
 [name] [name] is due for a refill of Ogsiveo. At this time, [facility] will not contact patient to schedule a delivery due to medication being on hold and patient may resume at a decreased dose. Refill assessment call date has been updated per the patient's request.    Horace Porteous, PharmD  [facility]

## 2023-06-02 NOTE — Unmapped (Signed)
 Patient called with difficulty flushing NG tube. They have been flushing regularly with warm water with each tube feed and have tried to trouble shoot with baking soda. Have not been able to use after feed on Friday.  Weight stable around 122.     Over the weekend she started having blood in stool, about 5 times daily per mom report.  No longer on blood thinner, stopped 05/20/2023. Increase in abdominal pain/rectal pain. Reviewed signs of significant blood loss that would require urgent evaluation; SOB, tachycardia, pale skin, syncope like feeling with position change, etc.  Mom wants to have repeat labs done now to see if blood loss has been significant from set done last week.  Order sent for patient to perform locally.      Last IV iron infusion was January 2025 per mom report but unclear where/what she received.      Recent labs;   Hgb - 05/27/23 - 11.1,  05/19/23 - 13.3  Iron - 05/19/23 - 22, 05/06/23 - 35  Iron Sat % - 05/19/23 - 8%, 05/06/23 - 13%  Ferritin - 05/19/23 - 28.7, 05/06/23 - 70.7    Will discuss with Dr. [name].

## 2023-06-06 IMAGING — CT CT ABD-PELV W/ CM
2 of 4 series · 16 of 46 positions shown, 18 images · IV contrast (omnipaque)
Comparison: None.

CLINICAL DATA: Abdominal pain, nausea, vomiting

EXAM:
CT ABDOMEN AND PELVIS WITH CONTRAST
TECHNIQUE: Multidetector CT imaging of the abdomen and pelvis was performed
using the standard protocol following bolus administration of
intravenous contrast.
CONTRAST:  80mL OMNIPAQUE IOHEXOL 300 MG/ML  SOLN

[Series 2: abd pel w · axial · 0.64mm/px · z∈[-323,+87]mm · 13 of 90 slices shown, 15 images]
[im 4/90  soft-tissue]
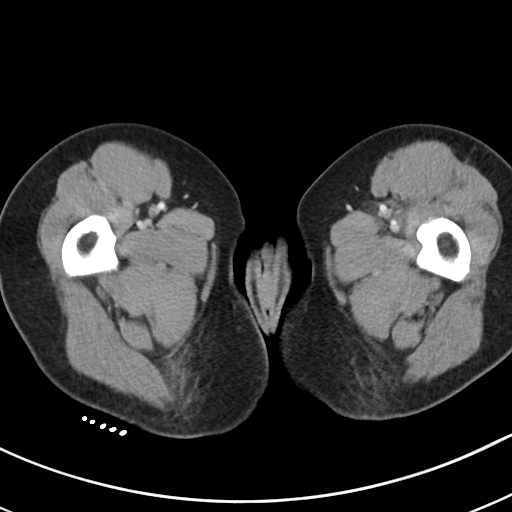
[im 4/90  bone]
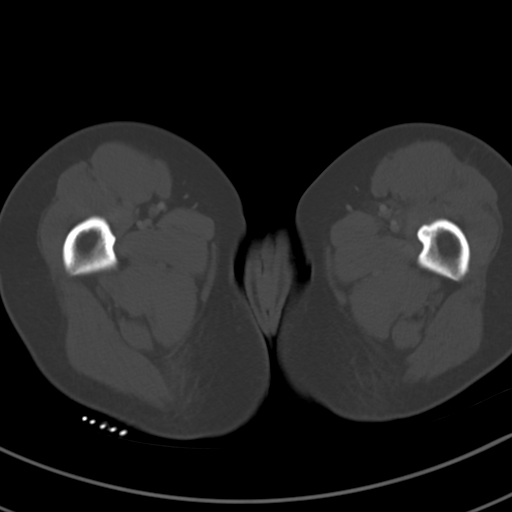
[im 11/90  soft-tissue]
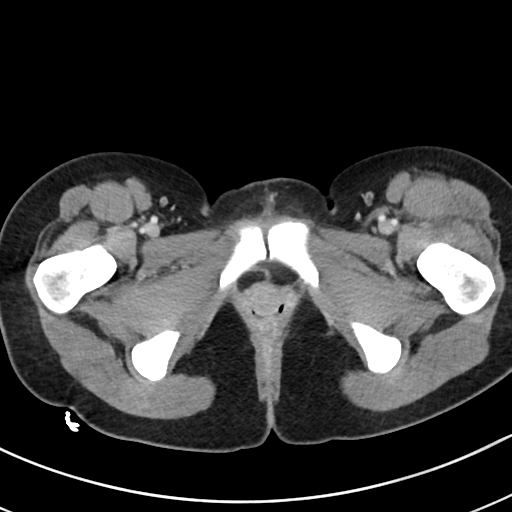
[im 18/90  soft-tissue]
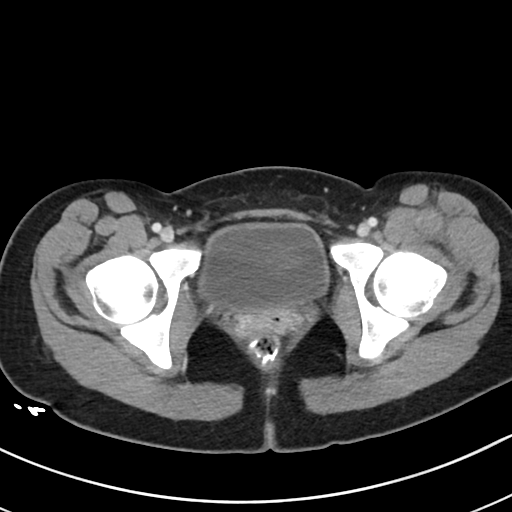
[im 24/90  soft-tissue]
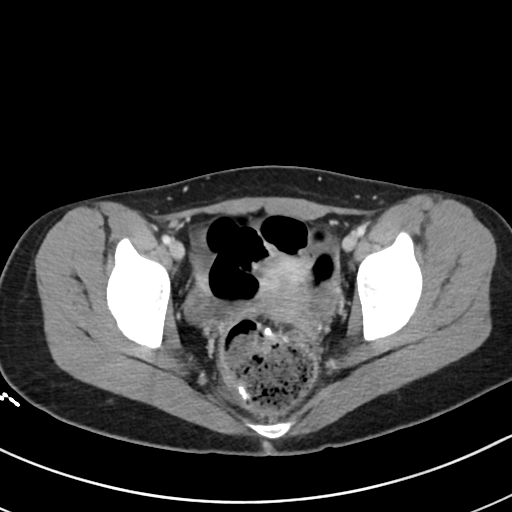
[im 31/90  soft-tissue]
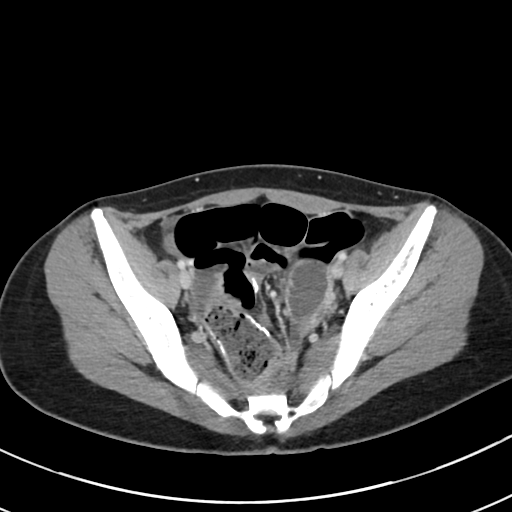
[im 38/90  soft-tissue]
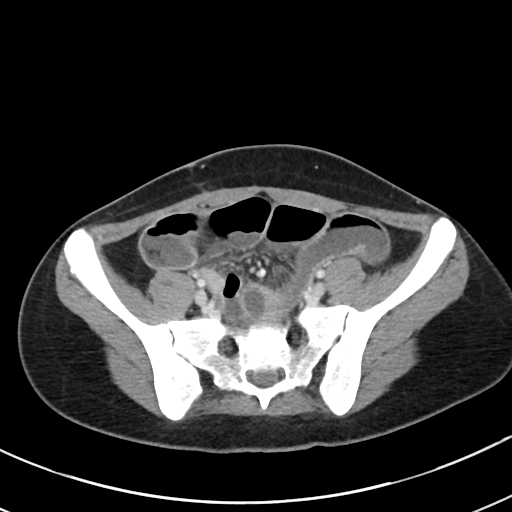
[im 45/90  soft-tissue]
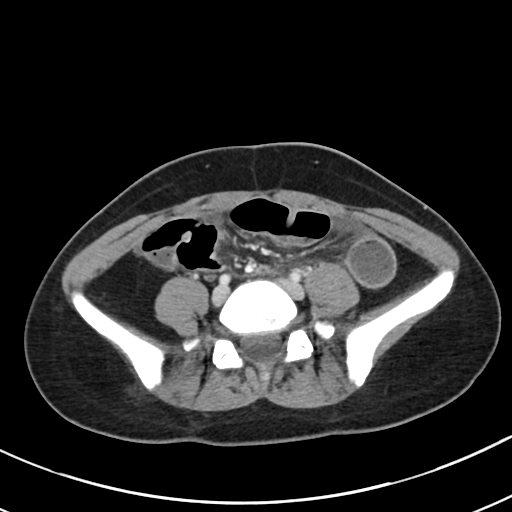
[im 52/90  soft-tissue]
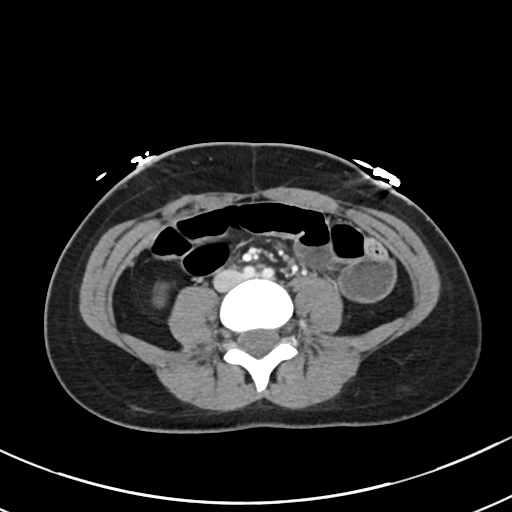
[im 59/90  soft-tissue]
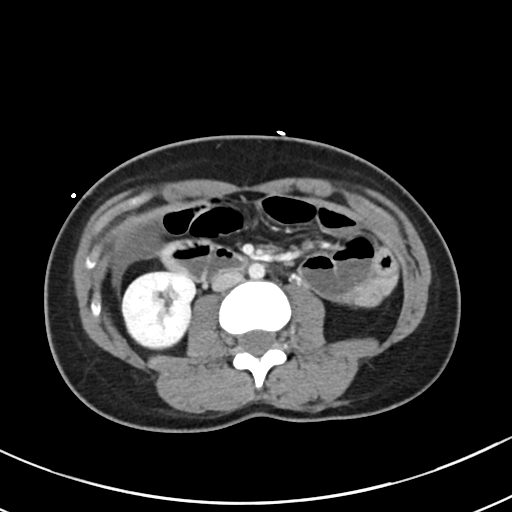
[im 59/90  bone]
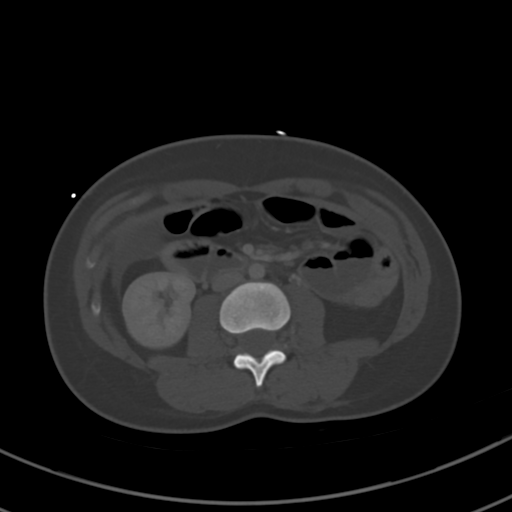
[im 66/90  soft-tissue]
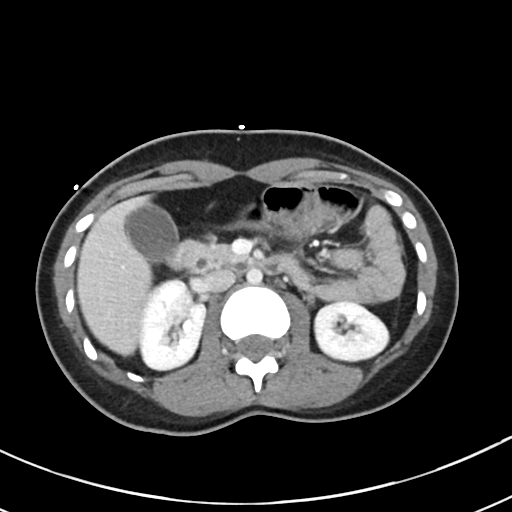
[im 72/90  soft-tissue]
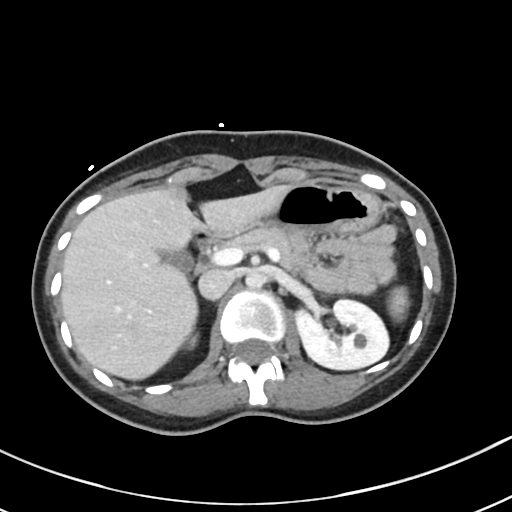
[im 79/90  soft-tissue]
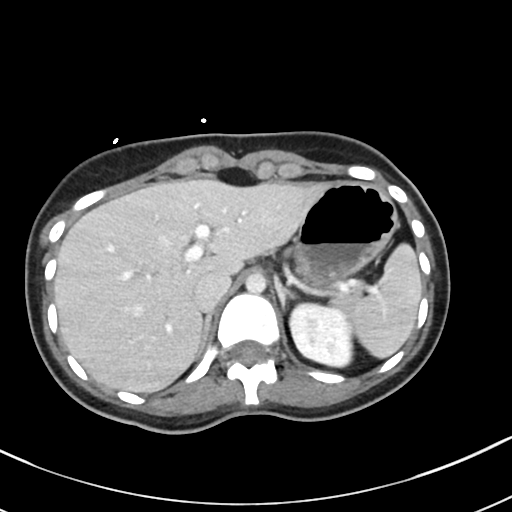
[im 86/90  soft-tissue]
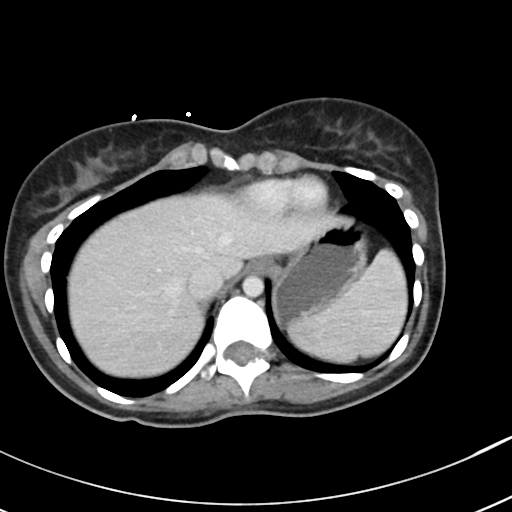

[Series 5: coronal · coronal · 0.79mm/px · 3 of 74 slices shown]
[im 25/74  soft-tissue]
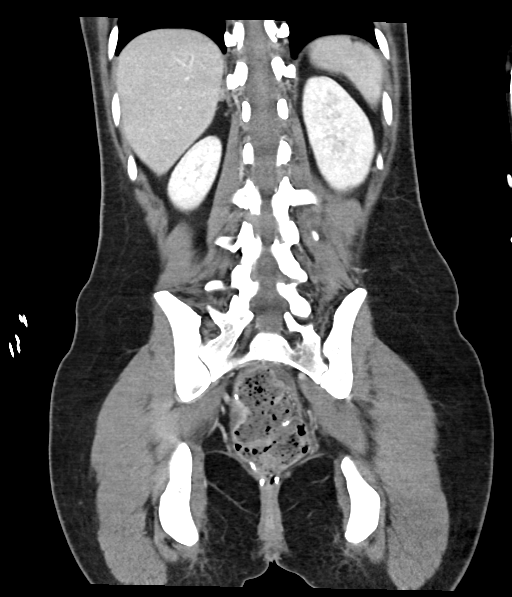
[im 33/74  soft-tissue]
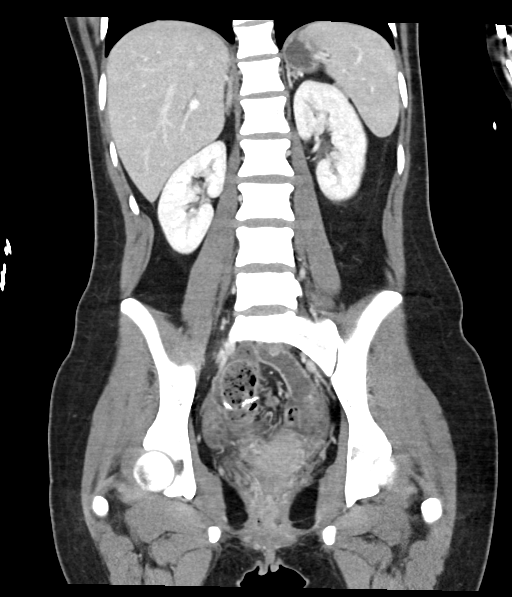
[im 41/74  soft-tissue]
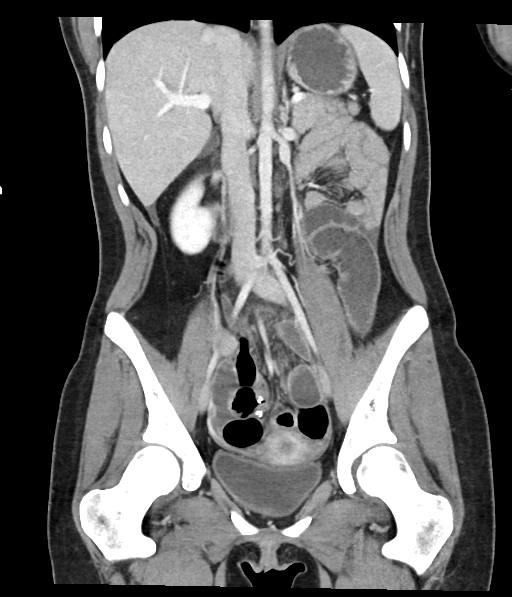

[16 of 46 positions shown; findings below may reference images not displayed]

FINDINGS: Lower chest: No acute abnormality.

Hepatobiliary: Scattered subcentimeter cysts. No suspicious focal
hepatic abnormality. Focal fatty infiltration adjacent to the
falciform ligament. Gallbladder unremarkable.

Pancreas: No focal abnormality or ductal dilatation.

Spleen: No focal abnormality.  Normal size.

Adrenals/Urinary Tract: 9 mm cyst in the midpole of the left kidney.
No hydronephrosis. Adrenal glands and urinary bladder unremarkable.

Stomach/Bowel: Prior colectomy. Mid to distal small bowel loops are
mildly dilated and fluid-filled with scattered air-fluid levels.
Proximal small bowel and stomach decompressed. Is difficult to
exclude partial small bowel obstruction.

Vascular/Lymphatic: No evidence of aneurysm or adenopathy.

Reproductive: Uterus and adnexa unremarkable.  No mass.

Other: Small amount of free fluid in the pelvis and adjacent to the
liver. No free air.

Musculoskeletal: No acute bony abnormality.
IMPRESSION: Prior colectomy. Mid to distal small bowel loops are prominent with
air-fluid levels. Difficult to completely exclude distal partial
small bowel obstruction.

Small amount of free fluid in the abdomen and pelvis.

## 2023-06-11 ENCOUNTER — Encounter: Admit: 2023-06-11 | Discharge: 2023-06-11

## 2023-06-11 ENCOUNTER — Encounter: Admit: 2023-06-11 | Discharge: 2023-06-11 | Attending: Gastroenterology | Primary: Gastroenterology

## 2023-06-11 DIAGNOSIS — G894 Chronic pain syndrome: Principal | ICD-10-CM

## 2023-06-11 DIAGNOSIS — M7918 Myalgia, other site: Principal | ICD-10-CM

## 2023-06-11 DIAGNOSIS — G893 Neoplasm related pain (acute) (chronic): Principal | ICD-10-CM

## 2023-06-11 DIAGNOSIS — R1084 Generalized abdominal pain: Principal | ICD-10-CM

## 2023-06-11 DIAGNOSIS — D48119 Desmoid tumor: Principal | ICD-10-CM

## 2023-06-11 MED ORDER — BACLOFEN 10 MG TABLET
ORAL_TABLET | Freq: Three times a day (TID) | ORAL | 1 refills | 30 days | Status: CP
Start: 2023-06-11 — End: 2023-09-09

## 2023-06-11 MED ORDER — OXYCODONE 5 MG/5 ML ORAL SOLUTION
ORAL | 0 refills | 6 days | Status: CN | PRN
Start: 2023-06-11 — End: 2023-06-18

## 2023-06-11 MED ORDER — NIFEDIPINE 20 MG CAPSULE
ORAL_CAPSULE | 1 refills | 0 days | Status: CP
Start: 2023-06-11 — End: ?

## 2023-06-11 MED ORDER — OXYCODONE 10 MG TABLET
ORAL_TABLET | Freq: Every day | ORAL | 0 refills | 24 days | Status: CP | PRN
Start: 2023-06-11 — End: ?

## 2023-06-11 MED ORDER — BUPRENORPHINE 20 MCG/HOUR WEEKLY TRANSDERMAL PATCH
MEDICATED_PATCH | TRANSDERMAL | 1 refills | 28 days | Status: CP
Start: 2023-06-11 — End: ?

## 2023-06-12 ENCOUNTER — Encounter: Admit: 2023-06-12 | Discharge: 2023-06-12

## 2023-06-12 ENCOUNTER — Ambulatory Visit: Admit: 2023-06-12 | Discharge: 2023-06-12

## 2023-06-12 DIAGNOSIS — D48119 Desmoid tumor: Principal | ICD-10-CM

## 2023-06-12 DIAGNOSIS — E860 Dehydration: Principal | ICD-10-CM

## 2023-06-13 IMAGING — CT CT ABD-PELV W/ CM
2 of 4 series · 15 of 46 positions shown, 17 images · IV contrast (APPLIED)
Comparison: 02/15/2021

CLINICAL DATA: Abdominal pain

EXAM:
CT ABDOMEN AND PELVIS WITH CONTRAST
TECHNIQUE: Multidetector CT imaging of the abdomen and pelvis was performed
using the standard protocol following bolus administration of
intravenous contrast.
CONTRAST:  80mL OMNIPAQUE IOHEXOL 350 MG/ML SOLN

[Series 2: axial st · axial · 0.67mm/px · z∈[-647,-247]mm · 12 of 90 slices shown, 14 images]
[im 5/90  soft-tissue]
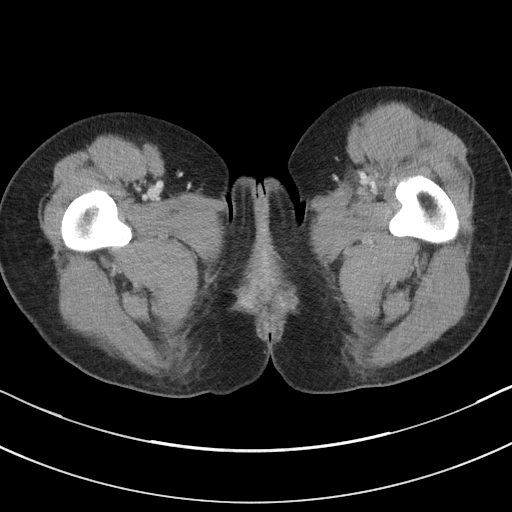
[im 5/90  bone]
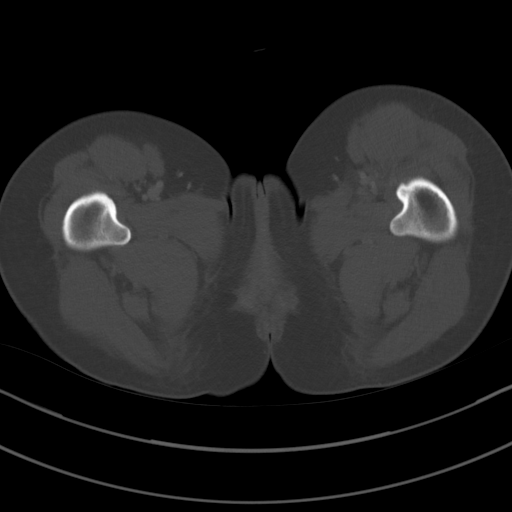
[im 15/90  soft-tissue]
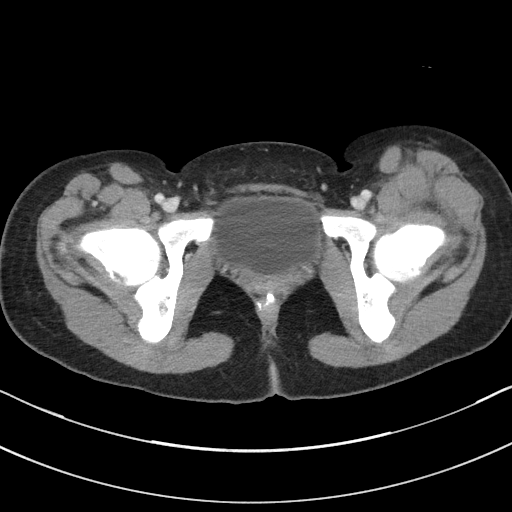
[im 19/90  soft-tissue]
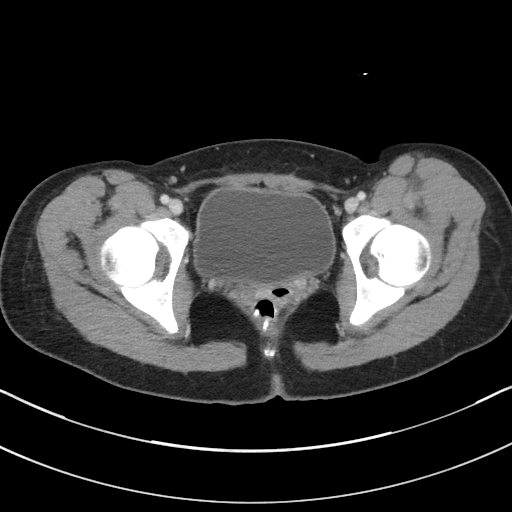
[im 29/90  soft-tissue]
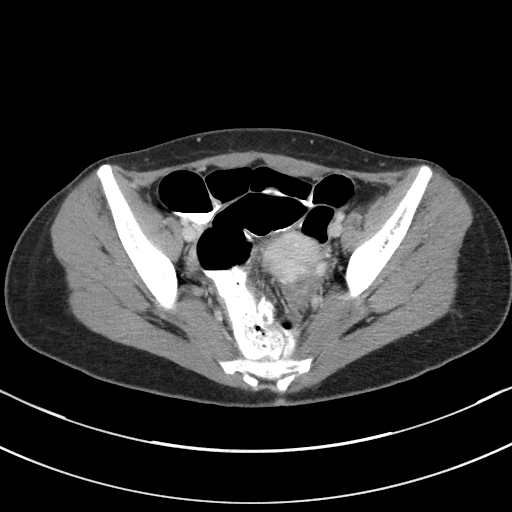
[im 33/90  soft-tissue]
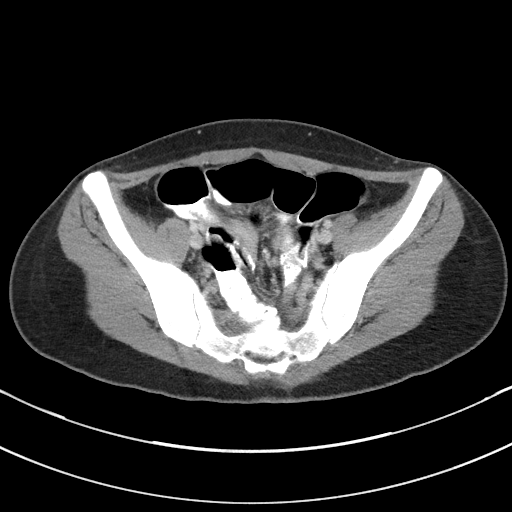
[im 43/90  soft-tissue]
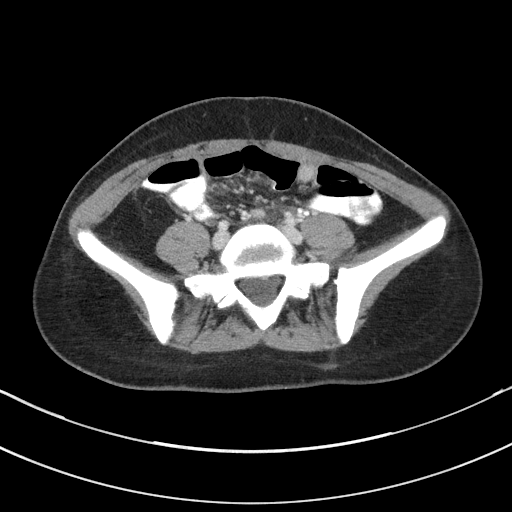
[im 47/90  soft-tissue]
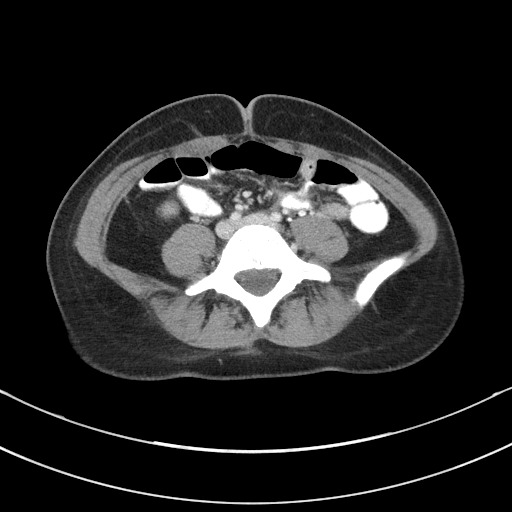
[im 57/90  soft-tissue]
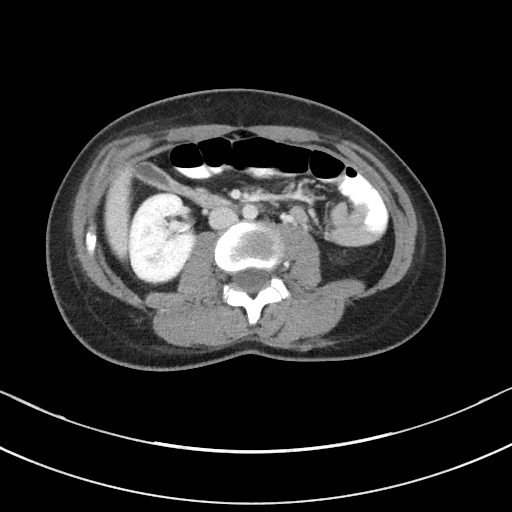
[im 61/90  soft-tissue]
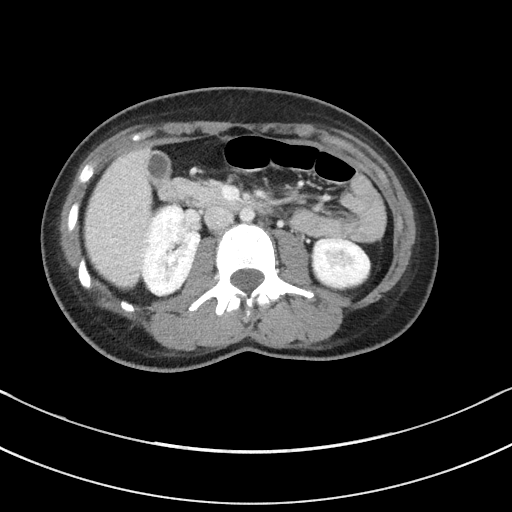
[im 61/90  bone]
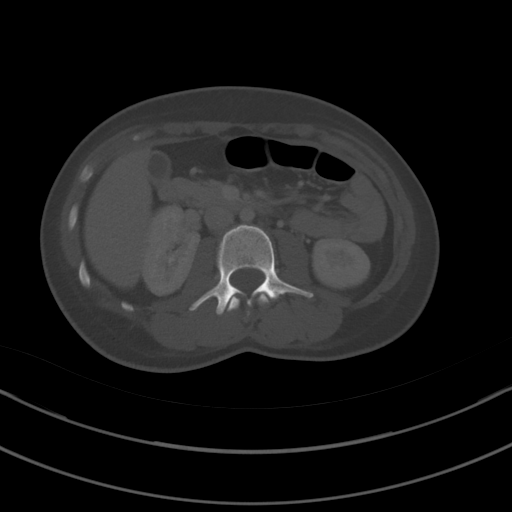
[im 71/90  soft-tissue]
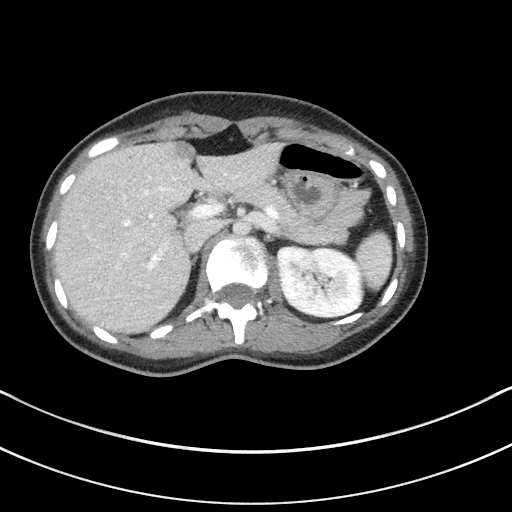
[im 75/90  soft-tissue]
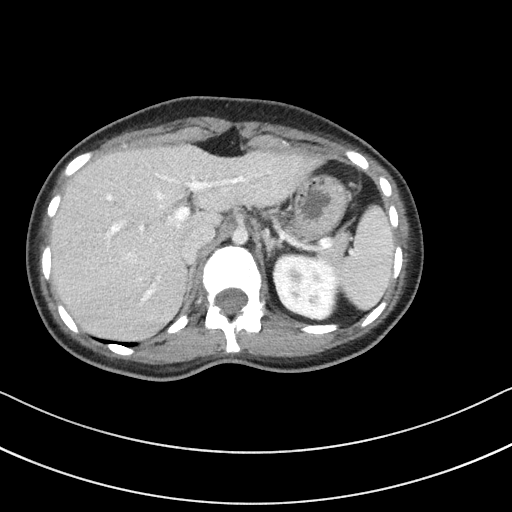
[im 85/90  soft-tissue]
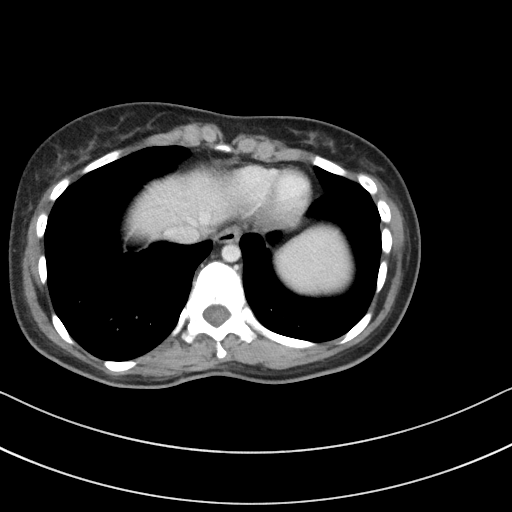

[Series 5: coronal st · coronal · 0.63mm/px · 3 of 74 slices shown]
[im 25/74  soft-tissue]
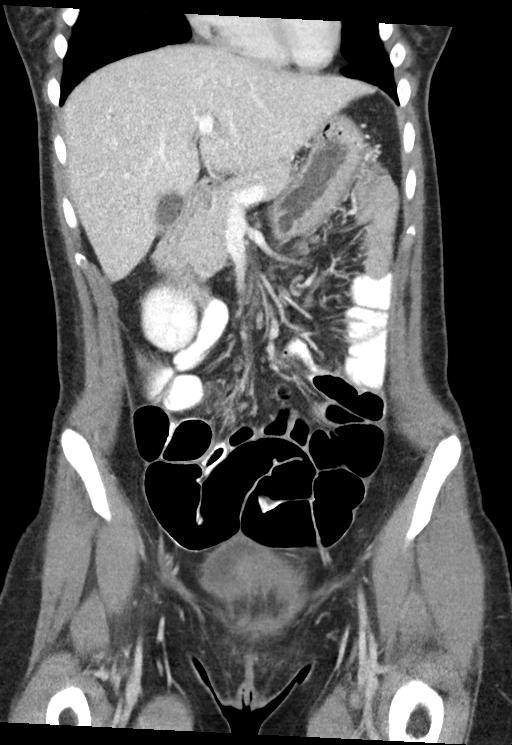
[im 33/74  soft-tissue]
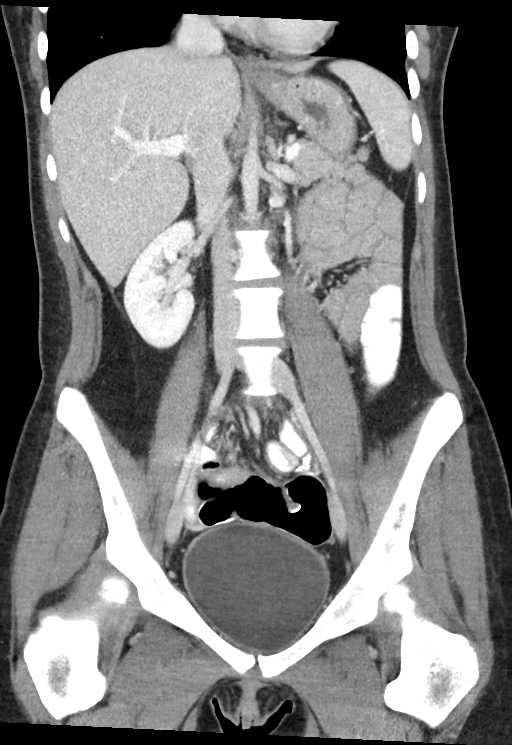
[im 41/74  soft-tissue]
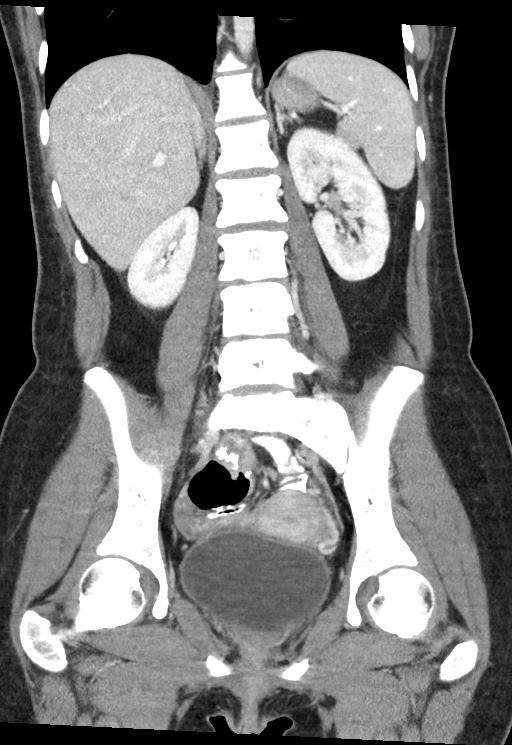

[15 of 46 positions shown; findings below may reference images not displayed]

FINDINGS: Lower chest: Unremarkable.

Hepatobiliary: There is 6 mm low-attenuation in the right lobe of
liver which has not changed, possibly a cyst. There is no dilation
of bile ducts. Gallbladder is not distended.

Pancreas: No focal abnormality is seen.

Spleen: Unremarkable.

Adrenals/Urinary Tract: Adrenals are not enlarged. There is no
hydronephrosis. There is 10 mm low-density structure in the
midportion of left kidney with density measurements higher than
usual for simple cyst. Higher than usual density measurement could
easily be normal variation due to intravenous contrast and dense
enhancement of adjacent renal cortex. Follow-up renal sonogram in
outpatient setting may be considered. There is contrast in the
pelvocaliceal systems limiting evaluation for small stones. In the
previous study, no definite renal stone was observed. There are no
calcific densities in the courses of the ureters. Urinary bladder is
unremarkable.

Stomach/Bowel: Stomach is not distended. Small bowel loops are not
dilated. There is evidence of subtotal colectomy with ileosigmoid
anastomosis. Oral contrast has reached rectosigmoid. Dilation of few
small bowel loops seen in the lower abdomen in the previous study
could not be identified in the current study.

Vascular/Lymphatic: Unremarkable.

Reproductive: Uterus is to the left of midline. There are no
dominant adnexal masses. Small amount of free fluid is seen in the
pelvis.

Other: There is no ascites or pneumoperitoneum.

Musculoskeletal: Unremarkable.
IMPRESSION: There is no evidence of intestinal obstruction or pneumoperitoneum.
There is no hydronephrosis.

Previous subtotal colectomy. Dilation of distal ileum seen in the
previous examination could not be identified in the current study.
Oral contrast has reached the rectum.

Possible small hepatic cyst. There is 10 mm smooth marginated
low-density lesion in the left kidney with density measurements
higher than usual for simple cyst. Follow-up renal sonogram in the
outpatient setting should be considered to re-evaluate this finding.

## 2023-06-15 DIAGNOSIS — D48119 Desmoid tumor: Principal | ICD-10-CM

## 2023-06-16 DIAGNOSIS — D48119 Desmoid tumor: Principal | ICD-10-CM

## 2023-06-18 ENCOUNTER — Ambulatory Visit: Admit: 2023-06-18 | Discharge: 2023-06-19 | Attending: Acute Care | Primary: Acute Care

## 2023-06-18 ENCOUNTER — Other Ambulatory Visit: Admit: 2023-06-18 | Discharge: 2023-06-19

## 2023-06-18 ENCOUNTER — Inpatient Hospital Stay: Admit: 2023-06-18 | Discharge: 2023-06-19 | Payer: BLUE CROSS/BLUE SHIELD

## 2023-06-18 DIAGNOSIS — D48119 Desmoid tumor: Principal | ICD-10-CM

## 2023-06-18 DIAGNOSIS — Q8789 Other specified congenital malformation syndromes, not elsewhere classified: Principal | ICD-10-CM

## 2023-06-18 DIAGNOSIS — D500 Iron deficiency anemia secondary to blood loss (chronic): Principal | ICD-10-CM

## 2023-06-18 MED ORDER — BUTALBITAL-ACETAMINOPHEN-CAFFEINE 50 MG-325 MG-40 MG TABLET
ORAL_TABLET | ORAL | 0 refills | 4.00000 days | Status: CP | PRN
Start: 2023-06-18 — End: 2023-06-23
  Filled 2023-06-18: qty 20, 4d supply, fill #0

## 2023-06-19 ENCOUNTER — Telehealth: Payer: Self-pay | Admitting: *Deleted

## 2023-06-19 DIAGNOSIS — D48119 Desmoid tumor: Principal | ICD-10-CM

## 2023-06-19 NOTE — Telephone Encounter (Signed)
 Tried to contact pt, voicemail box is full, unable to leave message. Pt just had labs done.

## 2023-06-19 NOTE — Telephone Encounter (Signed)
 Copied from CRM [id number]. Topic: Clinical - Medication Question >> Jun 19, 2023 10:37 AM Sonny Dandy B wrote: Reason for CRM: Pt called to request lab order be sent to the lab for Iron, varitain. Please call pt back at [phone]

## 2023-06-23 ENCOUNTER — Inpatient Hospital Stay: Admit: 2023-06-23 | Discharge: 2023-06-24 | Payer: BLUE CROSS/BLUE SHIELD

## 2023-06-23 ENCOUNTER — Ambulatory Visit: Admit: 2023-06-23 | Discharge: 2023-06-24 | Payer: BLUE CROSS/BLUE SHIELD

## 2023-06-23 DIAGNOSIS — Q8789 Other specified congenital malformation syndromes, not elsewhere classified: Principal | ICD-10-CM

## 2023-06-23 DIAGNOSIS — D48119 Desmoid tumor: Principal | ICD-10-CM

## 2023-06-24 DIAGNOSIS — D48119 Desmoid tumor: Principal | ICD-10-CM

## 2023-06-24 NOTE — Telephone Encounter (Signed)
 Left message on voicemail to call office.

## 2023-06-26 ENCOUNTER — Other Ambulatory Visit: Admit: 2023-06-26 | Discharge: 2023-06-27 | Payer: BLUE CROSS/BLUE SHIELD

## 2023-06-26 ENCOUNTER — Encounter: Admit: 2023-06-26 | Discharge: 2023-06-27 | Payer: BLUE CROSS/BLUE SHIELD

## 2023-06-26 ENCOUNTER — Ambulatory Visit: Admit: 2023-06-26 | Discharge: 2023-06-27 | Payer: BLUE CROSS/BLUE SHIELD

## 2023-06-26 DIAGNOSIS — D48119 Desmoid tumor: Principal | ICD-10-CM

## 2023-06-27 ENCOUNTER — Inpatient Hospital Stay: Admit: 2023-06-27 | Discharge: 2023-06-28 | Payer: BLUE CROSS/BLUE SHIELD

## 2023-06-27 DIAGNOSIS — D48119 Desmoid tumor: Principal | ICD-10-CM

## 2023-06-30 DIAGNOSIS — D48119 Desmoid tumor: Principal | ICD-10-CM

## 2023-06-30 NOTE — Unmapped (Signed)
 Specialty Medication(s): Ogsiveo      Ms.Weis has been dis-enrolled from the [facility] specialty pharmacy services as a result of  prescription discontinued, medication on hold .    Additional information provided to the patient: NA    Wilbern Hancock, [facility]  [facility] Specialty Pharmacist

## 2023-06-30 NOTE — Unmapped (Signed)
 Pt and her mom called today to share with you all of her symptoms:   Syncopal episode last Thursday  Still bleeding with bowel movements  (toilet full of blood) heart palpitations with some SOB.  She is very weak and has to use a wheelchair for any distance walking.  She did get some fluids over the weekend.  She is requesting Iron  infusion.  She has restarted chemotherapy recently.

## 2023-06-30 NOTE — Unmapped (Signed)
 Weekly hydration requested

## 2023-07-01 DIAGNOSIS — D48119 Desmoid tumor: Principal | ICD-10-CM

## 2023-07-02 DIAGNOSIS — D48119 Desmoid tumor: Principal | ICD-10-CM

## 2023-07-03 ENCOUNTER — Encounter: Admit: 2023-07-03 | Discharge: 2023-07-04 | Payer: BLUE CROSS/BLUE SHIELD

## 2023-07-03 DIAGNOSIS — D48119 Desmoid tumor: Principal | ICD-10-CM

## 2023-07-03 DIAGNOSIS — E860 Dehydration: Principal | ICD-10-CM

## 2023-07-04 ENCOUNTER — Ambulatory Visit: Admit: 2023-07-04 | Payer: BLUE CROSS/BLUE SHIELD

## 2023-07-04 ENCOUNTER — Ambulatory Visit: Admit: 2023-07-04 | Discharge: 2023-07-19 | Payer: BLUE CROSS/BLUE SHIELD

## 2023-07-04 ENCOUNTER — Encounter: Admit: 2023-07-04 | Payer: BLUE CROSS/BLUE SHIELD | Attending: Anesthesiology

## 2023-07-04 ENCOUNTER — Ambulatory Visit
Admit: 2023-07-04 | Discharge: 2023-07-19 | Disposition: A | Discharge status: Home / Self Care | Payer: BLUE CROSS/BLUE SHIELD | Admitting: Medical Oncology

## 2023-07-04 ENCOUNTER — Inpatient Hospital Stay
Admit: 2023-07-04 | Discharge: 2023-07-19 | Disposition: A | Discharge status: Home / Self Care | Payer: BLUE CROSS/BLUE SHIELD | Admitting: Medical Oncology

## 2023-07-04 ENCOUNTER — Encounter
Admit: 2023-07-04 | Discharge: 2023-07-19 | Disposition: A | Discharge status: Home / Self Care | Payer: BLUE CROSS/BLUE SHIELD | Admitting: Medical Oncology

## 2023-07-04 MED ORDER — OXYCODONE 10 MG TABLET
ORAL_TABLET | Freq: Every day | ORAL | 0 refills | 30.00000 days | Status: CP | PRN
Start: 2023-07-04 — End: 2023-07-03

## 2023-07-06 MED ORDER — BUDESONIDE DR-ER 9 MG TABLET,DELAYED AND EXTENDED RELEASE
ORAL_TABLET | Freq: Every day | ORAL | 0 refills | 30.00000 days | Status: CP
Start: 2023-07-06 — End: 2023-07-06

## 2023-07-07 DIAGNOSIS — D48119 Desmoid tumor: Principal | ICD-10-CM

## 2023-07-11 MED ORDER — OXYCODONE 10 MG TABLET
ORAL_TABLET | Freq: Every day | ORAL | 0 refills | 24 days | Status: CP | PRN
Start: 2023-07-11 — End: ?

## 2023-07-19 DIAGNOSIS — D48119 Desmoid tumor: Principal | ICD-10-CM

## 2023-07-19 MED ORDER — ESCITALOPRAM 5 MG TABLET
ORAL_TABLET | Freq: Every day | ORAL | 0 refills | 30.00000 days | Status: CP
Start: 2023-07-19 — End: ?
  Filled 2023-07-19: qty 30, 30d supply, fill #0

## 2023-07-19 MED ORDER — LORAZEPAM 0.5 MG TABLET
ORAL_TABLET | Freq: Two times a day (BID) | ORAL | 0 refills | 5.00000 days | Status: CP | PRN
Start: 2023-07-19 — End: ?
  Filled 2023-07-19: qty 10, 5d supply, fill #0

## 2023-07-19 MED ORDER — OXYCODONE 10 MG TABLET
ORAL_TABLET | Freq: Every day | ORAL | 0 refills | 21.00000 days | Status: CP | PRN
Start: 2023-07-19 — End: 2023-08-09
  Filled 2023-07-19: qty 210, 21d supply, fill #0

## 2023-07-19 MED ORDER — SCOPOLAMINE 1 MG OVER 3 DAYS TRANSDERMAL PATCH
MEDICATED_PATCH | TRANSDERMAL | 0 refills | 30.00000 days | Status: CP
Start: 2023-07-19 — End: ?
  Filled 2023-07-19: qty 10, 30d supply, fill #0

## 2023-07-19 MED ORDER — FAMOTIDINE 20 MG TABLET
ORAL_TABLET | Freq: Every evening | ORAL | 0 refills | 30.00000 days | Status: CP
Start: 2023-07-19 — End: ?
  Filled 2023-07-19: qty 30, 30d supply, fill #0

## 2023-07-19 MED ORDER — PROMETHAZINE 12.5 MG TABLET
ORAL_TABLET | Freq: Every day | ORAL | 0 refills | 0.00000 days | Status: CP | PRN
Start: 2023-07-19 — End: ?
  Filled 2023-07-19: qty 30, 30d supply, fill #0

## 2023-07-19 MED ORDER — LIDOCAINE 4 % TOPICAL PATCH
MEDICATED_PATCH | Freq: Every day | TRANSDERMAL | 0 refills | 30.00000 days | Status: CP
Start: 2023-07-19 — End: ?
  Filled 2023-07-19: qty 10, 10d supply, fill #0

## 2023-07-19 MED ORDER — ONDANSETRON 8 MG DISINTEGRATING TABLET
ORAL_TABLET | Freq: Three times a day (TID) | 0 refills | 10.00000 days | Status: CP | PRN
Start: 2023-07-19 — End: ?
  Filled 2023-07-19: qty 30, 10d supply, fill #0

## 2023-07-19 MED ORDER — BUDESONIDE DR - ER 3 MG CAPSULE,DELAYED,EXTENDED RELEASE
ORAL_CAPSULE | Freq: Every day | ORAL | 1 refills | 30.00000 days | Status: CP
Start: 2023-07-19 — End: 2023-09-17
  Filled 2023-07-19: qty 90, 30d supply, fill #0

## 2023-07-19 MED ORDER — PANTOPRAZOLE 40 MG TABLET,DELAYED RELEASE
ORAL_TABLET | Freq: Two times a day (BID) | ORAL | 0 refills | 30.00000 days | Status: CP
Start: 2023-07-19 — End: ?
  Filled 2023-07-19: qty 60, 30d supply, fill #0

## 2023-07-19 MED ORDER — BACLOFEN 10 MG TABLET
ORAL_TABLET | Freq: Three times a day (TID) | ORAL | 0 refills | 30.00000 days | Status: CP
Start: 2023-07-19 — End: 2023-08-18
  Filled 2023-07-19: qty 31, 21d supply, fill #0

## 2023-07-19 MED ORDER — NIROGACESTAT 50 MG TABLET
ORAL_TABLET | Freq: Two times a day (BID) | ORAL | 0 refills | 30.00000 days | Status: CP
Start: 2023-07-19 — End: ?

## 2023-07-19 MED ORDER — BUPRENORPHINE 20 MCG/HOUR WEEKLY TRANSDERMAL PATCH
MEDICATED_PATCH | TRANSDERMAL | 0 refills | 21.00000 days | Status: CP
Start: 2023-07-19 — End: 2023-08-09

## 2023-07-20 ENCOUNTER — Telehealth: Payer: Self-pay | Admitting: *Deleted

## 2023-07-20 ENCOUNTER — Encounter
Admit: 2023-07-20 | Discharge: 2023-07-20 | Payer: BLUE CROSS/BLUE SHIELD | Attending: Anesthesiology | Primary: Anesthesiology

## 2023-07-20 ENCOUNTER — Encounter: Admit: 2023-07-20 | Discharge: 2023-07-20 | Payer: BLUE CROSS/BLUE SHIELD

## 2023-07-20 ENCOUNTER — Inpatient Hospital Stay: Admit: 2023-07-20 | Discharge: 2023-07-20 | Payer: BLUE CROSS/BLUE SHIELD

## 2023-07-20 ENCOUNTER — Ambulatory Visit: Admit: 2023-07-20 | Discharge: 2023-07-20 | Payer: BLUE CROSS/BLUE SHIELD

## 2023-07-20 DIAGNOSIS — D48119 Desmoid tumor: Principal | ICD-10-CM

## 2023-07-20 DIAGNOSIS — Q8789 Other specified congenital malformation syndromes, not elsewhere classified: Principal | ICD-10-CM

## 2023-07-20 MED ORDER — NIROGACESTAT 100 MG TABLET
ORAL_TABLET | Freq: Every day | ORAL | 5 refills | 30.00000 days | Status: CP
Start: 2023-07-20 — End: ?
  Filled 2023-07-29: qty 14, 14d supply, fill #0

## 2023-07-20 NOTE — Transitions of Care (Post Inpatient/ED Visit) (Signed)
   07/20/2023  Name: Lisa Donovan MRN: [id number] DOB: [birth year]  Today's TOC FU Call Status: Today's TOC FU Call Status:: Unsuccessful Call (1st Attempt)  Attempted to reach the patient regarding the most recent Inpatient/ED visit.  Follow Up Plan: Additional outreach attempts will be made to reach the patient to complete the Transitions of Care (Post Inpatient/ED visit) call.   Cecilie Coffee [facility], BSN RN Care Manager/ Transition of Care [place]/ [facility] [phone]

## 2023-07-21 ENCOUNTER — Telehealth: Payer: Self-pay | Admitting: *Deleted

## 2023-07-21 DIAGNOSIS — D48119 Desmoid tumor: Principal | ICD-10-CM

## 2023-07-21 NOTE — Unmapped (Signed)
 [facility] Specialty Medication Onboarding    Specialty Medication: OGSIVEO 100 mg tablet (nirogacestat)  Prior Authorization: Approved   Financial Assistance: No - copay  <$25  Final Copay/Day Supply: $0.00 / 14 days    Insurance Restrictions: Yes - max 15 day supply for first 1-3 months of therapy     Notes to Pharmacist: none  Credit Card on File: no  Start Date on Rx:  07/20/23    The triage team has completed the benefits investigation and has determined that the patient is able to fill this medication at [facility]. Please contact the patient to complete the onboarding or follow up with the prescribing physician as needed.

## 2023-07-21 NOTE — Unmapped (Signed)
 VIR post call completed. Patient doing well, c/o soreness at procedural site. Answered questions r/t moving and lifting restrictions. Instructed to call the number provided at discharge with any problems or concerns.

## 2023-07-21 NOTE — Transitions of Care (Post Inpatient/ED Visit) (Signed)
   07/21/2023  Name: Lisa Donovan MRN: [id number] DOB: [birth year]  Today's TOC FU Call Status: Today's TOC FU Call Status:: Unsuccessful Call (2nd Attempt) Unsuccessful Call (2nd Attempt) Date: 07/21/23  Attempted to reach the patient regarding the most recent Inpatient/ED visit.  Follow Up Plan: Additional outreach attempts will be made to reach the patient to complete the Transitions of Care (Post Inpatient/ED visit) call.   Cecilie Coffee [facility], BSN RN Care Manager/ Transition of Care [place]/ [facility] [phone]

## 2023-07-22 ENCOUNTER — Telehealth: Payer: Self-pay

## 2023-07-22 NOTE — Unmapped (Addendum)
 Dose changed from 50 mg twice daily to 100 mg once daily due to 50 mg tablets no longer made by manufacturer.    [name] [name]    Patient Onboarding/Medication Counseling    [name] [name] is a 23 y.o. female with [name] who I am counseling today on resuming of therapy.  I am speaking to the patient's family member, [name] [name].    Was a Nurse, learning disability used for this call? No    Verified patient's date of birth / HIPAA.    Specialty medication(s) to be sent: Hematology/Oncology: Ogsiveo    Non-specialty medications/supplies to be sent: none    Medications not needed at this time: none     Ogsiveo (nirogacestat)    The patient declined counseling on missed dose instructions, goals of therapy, side effects and monitoring parameters, warnings and precautions, drug/food interactions, and storage, handling precautions, and disposal because they have taken the medication previously. The information in the declined sections below are for informational purposes only and was not discussed with patient.     Medication & Administration     Dosage: Take 1 tablet (100 mg total) by mouth daily. Swallow tablets whole; do not break, crush, or chew.    Administration:   Administer with or without food.   Swallow tablets whole; do not break, crush, or chew.    Adherence/Missed dose instructions:  Skip the missed dose and go back to your normal time.  Do not take 2 doses at the same time or extra doses.    Goals of Therapy   Treatment of progressing desmoid tumors in adults who require systemic treatment.    Side Effects & Monitoring Parameters   List common side effects:  Dermatologic:  Skin rash (68%) - use alcohol free, fragrance free and dye free moisturizer  Alopecia (19%),   Folliculitis (<15%),   Dermatologic disorder (hidradenitis suppurativa: <15%),  Skin carcinoma (nonmelanoma: <15%)  Endocrine & metabolic:   Decreased serum phosphate (20% to 65%),   Decreased serum potassium (22%)  Gastrointestinal:  Diarrhea (84%) - Median time to first event of diarrhea - 9 days (range: 2 to 434 days).  Nausea (54%; grade 3: 1%),   Mouth irritation or mouth sores (39%; grade 3: 4%)  Abdominal pain (22%),   Genitourinary:   Glycosuria (51%),   Ovarian disease (75%, including amenorrhea, ovarian failure, menopause, and premature menopause; serious: 4%),   Proteinuria (40%)  Hepatic:   Increased serum alanine aminotransferase (30%),  Increased serum aspartate aminotransferase (33%)  Nervous system:   Fatigue (54%),   Headache (30%)  Respiratory:   Cough (20%),   Shortness of breath (16%),   Nose bleeds (<15%),   Flu-like symptoms (<15%),   Upper respiratory tract infection (17%)    The following side effects should be reported to the provider:   Signs of an allergic reaction  Signs of liver problems - dark urine, tiredness, lighter colored stool, yellowing of the skin or eyes  Signs of electrolyte problems: mood changes, confusion, muscle pain, cramps, spasms, weakness, shakiness  Signs of high blood sugar: confusion, increased drowsiness, increased thirst, increased hunger, or increase urination.  Signs of low estrogen: hot flashes, night sweats or vaginal dryness  Period (menstrual) changes  Shortness of breath  Certain types of skin cancer have happened in people taking this drug. Call your doctor right away if you have a change in color or size of a mole, or any new or changing skin lump or growth.  Monitoring Parameters:  LFTs, phosphate and potassium levels regularly during treatment.   Perform dermatologic evaluations at baseline and routinely during treatment.   Verify pregnancy status prior to treatment initiation in patients who could become pregnant.  Signs/symptoms of electrolyte disturbances (phosphate, potassium), GI toxicity (diarrhea), ovarian toxicity (changes in menstrual cycle regularity or symptoms of estrogen deficiency [e.g., hot flashes, night sweats, vaginal dryness]), and/or nonmelanoma skin cancers.    Contraindications, Warnings, & Precautions   Electrolyte abnormalities: Alterations in potassium and phosphate levels may occur during treatment with nirogacestat. In a clinical study, two-thirds of patients experienced decreased phosphate and one-quarter of patients experienced decreased potassium; phosphate <2 mg/dL and grade 3 hypokalemia also occurred.  GI toxicity: Diarrhea, including severe cases, can occur with nirogacestat treatment. A majority of patients treated with nirogacestat in a clinical study experienced diarrhea; grade 3 events were also observed. Median time to first event of diarrhea was 9 days (range: 2 to 434 days).  Hepatotoxicity: AST or ALT elevations occurred in approximately one-third of nirogacestat-treated patients; grade 3 events were also observed.  Skin cancers: Treatment with nirogacestat may result in the occurrence of new nonmelanoma skin cancers (including cutaneous squamous cell carcinoma and basal cell carcinoma).    Pregnancy and Lactation   Verify pregnancy status prior to treatment initiation.  Patients who could become pregnant and patients with partners who could become pregnant should use effective contraception during therapy and for 1 week after the last dose.  Nirogacestat may cause ovarian toxicity and impair fertility; impact may depend on multiple factors including duration of therapy and gonadal function at the start of treatment. Monitor for menstrual cycle changes and symptoms of estrogen deficiency (e.g., hot flashes, night sweats, vaginal dryness).  Based on the mechanism of action and data from animal reproduction studies using doses less than the recommended human dose (based on AUC), in utero exposure to nirogacestat may cause fetal harm.  It is not known if nirogacestat is present in breast milk.  Due to the potential for serious adverse reactions in the breastfed infant, breastfeeding is not recommended by the manufacturer during therapy and for 1 week after the last dose.    Drug/Food Interactions     Medication list reviewed in Epic. The patient was instructed to inform the care team before taking any new medications or supplements. Concurrent use of a moderate inhibitor of CYP3A4 without a dosage adjustment of naloxegol may result in increased levels of naloxegol, which may precipitate opioid withdrawal symptoms; 2. Coadministration of nirogacestat with a strong or moderate CYP3A4 inducer decreases nirogacestat plasma concentrations, which may decrease efficacy of nirogacestat; 3. Coadministration of proton pump inhibitors (PPIs) or H2 antagonists may reduce the bioavailability of nirogacestat, leading to decreased systemic levels and effectiveness; 4. Ogsiveo (CYP3A4 Inhibitors -Moderate) may increase serum concentration of Budesonide    Avoid Grapefruit, [place]ville oranges, and starfruit.  Avoid PPI's or H2 blockers while on this medication.  If needed, may take antacids (TUMS/Rolaids) 2 hours before or 2 hours after taking this medication.  Storage, Handling Precautions, & Disposal   Store at room temperature in a dry place. Do not store in a bathroom.  Keep all drugs in a safe place. Keep all drugs out of the reach of children and pets.  Throw away unused or expired drugs. Do not flush down a toilet or pour down a drain unless you are told to do so. Check with your pharmacist if you have questions about the best way to throw out  drugs. There may be drug take-back programs in your area.  If a caregiver prepares your dose for you, they should consider wearing gloves or pour the pills directly from their container into the cap, a small cup, or directly into your hand. They should avoid touching the pills. They should always wash their hands before and after giving you the medication.    Current Medications (including OTC/herbals), Comorbidities and Allergies     Current Outpatient Medications   Medication Sig Dispense Refill acetaminophen (TYLENOL EXTRA STRENGTH) 500 MG tablet Take 2 tablets (1,000 mg total) by mouth every eight (8) hours.      baclofen (LIORESAL) 10 MG tablet Take 0.5 tablets (5 mg total) by mouth Three (3) times a day. 45 tablet 0    budesonide (ENTOCORT EC) 3 mg 24 hr capsule Take 3 capsules (9 mg total) by mouth daily. 90 capsule 1    buprenorphine (BUTRANS) 20 mcg/hour PTWK transdermal patch Place 1 patch on the skin once a week for 21 days. 3 patch 0    butalbital-acetaminophen-caffeine (ESGIC) 50-325-40 mg per tablet Take 1 tablet by mouth two (2) times a day as needed for headache. 20 tablet 0    cetirizine (ZYRTEC) 10 MG tablet Take 1 tablet (10 mg total) by mouth two (2) times a day. After iron infusions      escitalopram oxalate (LEXAPRO) 5 MG tablet Take 1 tablet (5 mg total) by mouth daily. 30 tablet 0    famotidine (PEPCID) 20 MG tablet Take 1 tablet (20 mg total) by mouth nightly. 30 tablet 0    fluticasone propionate (FLONASE) 50 mcg/actuation nasal spray Use 1 spray under the butrans patch once every 7 days to prevent allergic reaction to the adhesive      lidocaine (ASPERCREME) 4 % patch Place 1 patch on the skin daily. Apply for 12 hours then remove for 12 hours. 30 patch 0    LORazepam (ATIVAN) 0.5 MG tablet Take 1 tablet (0.5 mg total) by mouth two (2) times a day as needed for anxiety. 10 tablet 0    naloxegol (MOVANTIK) 12.5 mg tablet Take 1 tablet (12.5 mg total) by mouth daily as needed (constipation).      nirogacestat (OGSIVEO) 100 mg tablet Take 1 tablet (100 mg total) by mouth daily. Swallow tablets whole; do not break, crush, or chew. 30 tablet 5    ondansetron (ZOFRAN-ODT) 8 MG disintegrating tablet Dissolve 1 tablet (8 mg total) in the mouth every eight (8) hours as needed for nausea. 30 tablet 0    oxyCODONE (ROXICODONE) 10 mg immediate release tablet Take 2 tablets (20 mg total) by mouth Five (5) times daily as needed for pain. 210 tablet 0    pantoprazole (PROTONIX) 40 MG tablet Take 1 tablet (40 mg total) by mouth Two (2) times a day (30 minutes before a meal). 60 tablet 0    promethazine (PHENERGAN) 12.5 MG tablet Take 1 tablet (12.5 mg total) by mouth as needed for nausea. 30 tablet 0    scopolamine (TRANSDERM-SCOP) 1 mg over 3 days Place 1 patch (1 mg total) on the skin every third day. 10 patch 0     No current facility-administered medications for this visit.       Allergies   Allergen Reactions    Adhesive Tape-Silicones Hives and Rash     Paper tape and Tegederm ok. Biopatch causes blistering but has tolerated CHG Tegaderm    Ferrlecit [Sodium Ferric Gluconat-Sucrose] Swelling and Rash  Levofloxacin Swelling and Rash     Swelling in mouth, rash,     Methylnaltrexone      Per Patient: I lost bowel control, severe abdominal cramping, and elevated BP    Neomycin Swelling     Rxn after ear drops; ear swelling    Papaya Hives    Morphine Nausea And Vomiting    Zosyn [Piperacillin-Tazobactam] Itching and Rash     Red and itchy    Compazine [Prochlorperazine] Other (See Comments)     Extreme agitation    Iron Analogues     Reglan [Metoclopramide Hcl] Diarrhea    Iron Dextran Itching     Received iron dextran 06/08/22 over 12 hours, had itching and redness/flushing during the infusion and for a couple days after. Required IV benadryl w/flares between doses and ultimately treated w/IV methylpred for 2 days.     Latex, Natural Rubber Rash       Patient Active Problem List   Diagnosis    [name] (familial adenomatous polyposis)    Intestinal polyps    [name]    Benign neoplasm of skin of trunk    Other benign neoplasm of connective and other soft tissue of upper limb, including shoulder    History of colonic polyps    [name] of skin    Thyroid cyst    Syncope    Chemotherapy-induced nausea    Cancer associated pain    Iron deficiency anemia due to chronic blood loss    Dehydration    Physical deconditioning    History of colectomy    Acute deep vein thrombosis (DVT) of axillary vein of right upper extremity      Severe protein-calorie malnutrition (HHS-HCC)    Winged scapula of right side    Lower abdominal pain    Intractable abdominal pain    Generalized anxiety disorder with panic attacks    Hyperglycemia    Hypoglycemia    SBO (small bowel obstruction)      MDD (major depressive disorder)    Small intestinal bacterial overgrowth (SIBO)    Hypersensitivity reaction    Anemia    On total parenteral nutrition (TPN)    Urinary retention    High risk medication use    Other acute postprocedural pain    Pain medication agreement signed    Insomnia    Iron deficiency    Dizziness    Fatigue    Pouchitis      Elevated liver transaminase level    Positive blood culture    C. difficile diarrhea    Nasogastric tube present    Photophobia    Chronic migraine with aura without status migrainosus, not intractable    Hypercoagulable state (HHS-HCC)    Anxiety and depression    Gastrointestinal hemorrhage       Reviewed and up to date in Epic.    Medication list has been reviewed and updated in Epic: Last Reviewed by Karilyn Ouch, DO on 07/20/2023    Allergies have been reviewed and updated in Epic: Last Reviewed by Karilyn Ouch, DO on 07/20/2023    Appropriateness of Therapy     Acute infections noted within Epic:  No active infections  Patient reported infection: None    Is the medication and dose appropriate based on diagnosis, medication list, comorbidities, allergies, medical history, patient???s ability to self-administer the medication, and therapeutic goals? Yes    Prescription has been clinically reviewed: Yes    Patient-Reported Symptoms  Tracker for Cancer Patients on Oral Chemotherapy     Oral chemotherapy medication name(s): Ogsiveo  Dose and frequency: 100 mg once daily  Oral Chemotherapy Start Date:    Baseline? No  Clinic(s) visited: [facility]  Were you able to reach the patient on a call today? Yes    Symptom Grouping Question Patient Response   Digestion and Eating Have you felt sick to your stomach? Denies    Had diarrhea? Denies    Constipated? Denies    Not wanting to eat? Denies    Comments      Sleep and Pain Felt very tired even after you rest? Denies    Pain due to cancer medication or cancer? Denies    Comments     Other Side Effects Numbness or tingling in hands and/or feet? Denies    Felt short of breath? Denies    Mouth or throat Sores?      Rash? Denies    Palmar-plantar erythrodysesthesia syndrome?      Rash - acneiform?      Rash - maculo-papular?      How many days over the past month did your cancer medication or cancer keep you from your normal activities?  Write in number of days, 0-30:  0    Other side effects or things you would like to discuss?      Comments?     Adherence  In the last 30 days, on how many days did you miss at least one dose of any of your [drug name]? Write in number of days, 0-30:  0    What reasons are you having trouble taking your medication [pharmacist: check all that apply]? Specify chemotherapy cycle:        No problems identified    Comments:        Comments       Optional Symptom Tracking  New start - hematology ([facility]    Comments: SHe has taken previously, due to being in and out of [name] and changes in dose, she has not needed refills as often.  Reonboarded due to gap in filling at [name]    Specialty Pharmacist Interventions:     Question Patient Response    What actions did you take in response to the patient's PRO answers? No action required   Other (Specify):        Financial Information     Medication Assistance provided: None Required    Anticipated copay of $0 reviewed with patient. Verified delivery address.    Delivery Information     Scheduled delivery date: 07/30/23    Expected start date: continuing on medication    Medication will be delivered via UPS to the prescription address in [facility].  This shipment will not require a signature.      Explained the services we provide at [name] and [name] and that each month we would call to set up refills.  Stressed importance of returning phone calls so that we could ensure they receive their medications in time each month.  Informed patient that we should be setting up refills 7-10 days prior to when they will run out of medication.  A pharmacist will reach out to perform a clinical assessment periodically.  Informed patient that a welcome packet, containing information about our [name] and other support services, a Notice of Privacy Practices, and a drug information handout will be sent.      The patient  or caregiver noted above participated in the development of this care plan and knows that they can request review of or adjustments to the care plan at any time.      Patient or caregiver verbalized understanding of the above information as well as how to contact the [name] at [phone] option 4 with any questions/concerns.  The [name] is open Monday through Friday 8:30am-4:30pm.  A pharmacist is available 24/7 via pager to answer any clinical questions they may have.    Patient Specific Needs     Does the patient have any physical, cognitive, or cultural barriers? No    Does the patient have adequate living arrangements? (i.e. the ability to store and take their medication appropriately) Yes    Did you identify any home environmental safety or security hazards? No    Patient prefers to have medications discussed with  [name]     Is the patient or caregiver able to read and understand education materials at a high school level or above? Yes    Patient's primary language is  English     Is the patient high risk? No    Does the patient have an additional or emergency contact listed in their chart? Yes    SOCIAL DETERMINANTS OF HEALTH     At the [name], we have learned that life circumstances - like trouble affording food, housing, utilities, or transportation can affect the health of many of our patients. That is why we wanted to ask: are you currently experiencing any life circumstances that are negatively impacting your health and/or quality of life? Patient declined to answer    Social Drivers of Health     Food Insecurity: No Food Insecurity (03/10/2023)    Hunger Vital Sign     Worried About Running Out of Food in the Last Year: Never true     Ran Out of Food in the Last Year: Never true   Tobacco Use: Low Risk  (07/20/2023)    Patient History     Smoking Tobacco Use: Never     Smokeless Tobacco Use: Never     Passive Exposure: Past   Transportation Needs: No Transportation Needs (05/01/2023)    PRAPARE - Transportation     Lack of Transportation (Medical): No     Lack of Transportation (Non-Medical): No   Alcohol Use: Not on file   Housing: Low Risk  (03/10/2023)    Housing     Within the past 12 months, have you ever stayed: outside, in a car, in a tent, in an overnight shelter, or temporarily in someone else's home (i.e. couch-surfing)?: No     Are you worried about losing your housing?: No   Physical Activity: Not on file   Utilities: Low Risk  (03/10/2023)    Utilities     Within the past 12 months, have you been unable to get utilities (heat, electricity) when it was really needed?: No   Stress: Not on file   Interpersonal Safety: Not on file   Substance Use: Not on file (01/12/2023)   Intimate Partner Violence: Not on file   Social Connections: Not on file   Financial Resource Strain: Low Risk  (03/10/2023)    Overall Financial Resource Strain (CARDIA)     Difficulty of Paying Living Expenses: Not very hard   Health Literacy: Low Risk  (04/11/2023)    Health Literacy     : Never   Internet Connectivity: Not on file  Would you be willing to receive help with any of the needs that you have identified today? Not applicable       [name], [name]  [name] Pharmacist

## 2023-07-22 NOTE — Transitions of Care (Post Inpatient/ED Visit) (Signed)
   07/22/2023  Name: Lisa Donovan MRN: [id number] DOB: [birth year]  Today's TOC FU Call Status: Today's TOC FU Call Status:: Unsuccessful Call (3rd Attempt) Unsuccessful Call (3rd Attempt) Date: 07/22/23  Attempted to reach the patient regarding the most recent Inpatient/ED visit.  Follow Up Plan: No further outreach attempts will be made at this time. We have been unable to contact the patient.  Orpha Blade, RN, BSN, CEN [employer]- Transition of Care Team.  [facility] [phone]

## 2023-07-28 ENCOUNTER — Telehealth: Payer: Self-pay | Admitting: *Deleted

## 2023-07-28 ENCOUNTER — Other Ambulatory Visit (INDEPENDENT_AMBULATORY_CARE_PROVIDER_SITE_OTHER)

## 2023-07-28 ENCOUNTER — Other Ambulatory Visit: Payer: Self-pay | Admitting: *Deleted

## 2023-07-28 DIAGNOSIS — D48119 Desmoid tumor: Principal | ICD-10-CM

## 2023-07-28 DIAGNOSIS — R710 Precipitous drop in hematocrit: Secondary | ICD-10-CM

## 2023-07-28 NOTE — Telephone Encounter (Signed)
 Copied from CRM [id number]. Topic: Clinical - Request for Lab/Test Order >> Jul 28, 2023 12:46 PM [place] wrote: Reason for CRM: Patient wanting to know if she can get an order for a CVC, please contact as soon as possible Mother- # [phone]

## 2023-07-28 NOTE — Telephone Encounter (Signed)
 Spoke to pt's mother Lisa Donovan told her Lisa Donovan found out if we order STAT labs we will get them back right away so if you want to come in today you can. Lisa Donovan verbalized understanding and said yes. Appt scheduled for Lab 3:30 PM. Order put in Epic.

## 2023-07-28 NOTE — Telephone Encounter (Signed)
 Spoke to pt's mother Lisa Donovan asked her how can I help? She said she was wanting to know if Lisa Donovan could order a CBC for pt due to ongoing GI bleed and they are monitoring her hemoglobin so they would not have to drive all the way to [facility]. Told her one moment I will check with Samantha. Lisa Donovan discussed with Lisa Donovan and she said she would order it, but to let you know are machines have been down and we have not received any lab work since Friday so not sure when it would be back. Lisa Donovan verbalized understanding and said they are going to wait to see if they can get her an appt tomorrow at the infusion center if not will call back to check on situation with the lab. Told her okay.

## 2023-07-29 ENCOUNTER — Ambulatory Visit: Payer: Self-pay | Admitting: Physician Assistant

## 2023-07-29 DIAGNOSIS — D48119 Desmoid tumor: Principal | ICD-10-CM

## 2023-07-29 LAB — CBC WITH DIFFERENTIAL/PLATELET
Basophils Absolute: 0.1 10*3/uL (ref 0.0–0.1)
Basophils Relative: 0.8 % (ref 0.0–3.0)
Eosinophils Absolute: 0.3 10*3/uL (ref 0.0–0.7)
Eosinophils Relative: 3.7 % (ref 0.0–5.0)
HCT: 36.1 % (ref 36.0–46.0)
Hemoglobin: 11.8 g/dL — ABNORMAL LOW (ref 12.0–15.0)
Lymphocytes Relative: 26.3 % (ref 12.0–46.0)
Lymphs Abs: 1.8 10*3/uL (ref 0.7–4.0)
MCHC: 32.7 g/dL (ref 30.0–36.0)
MCV: 87.2 fl (ref 78.0–100.0)
Monocytes Absolute: 0.8 10*3/uL (ref 0.1–1.0)
Monocytes Relative: 12 % (ref 3.0–12.0)
Neutro Abs: 3.9 10*3/uL (ref 1.4–7.7)
Neutrophils Relative %: 57.2 % (ref 43.0–77.0)
Platelets: 235 10*3/uL (ref 150.0–400.0)
RBC: 4.14 Mil/uL (ref 3.87–5.11)
RDW: 15.1 % (ref 11.5–15.5)
WBC: 6.8 10*3/uL (ref 4.0–10.5)

## 2023-07-30 NOTE — Unmapped (Signed)
 [facility] BONE AND SOFT TISSUE ONCOLOGY RETURN VISIT    Encounter Date: 07/31/2023  Patient Name: [name] [name]  Medical Record Number: [id number]    Referring Physician: Lark Plum, MD  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    Desmoid fibromatosis, history of [name]     She has had numerous sites of disease, including paraspinal, chest wall, right arm/wrist. She has had several cryoablations with Dr. Wilhelmenia Harada which have seemed to offer clinical benefit. She trialed sorafenib which seemed to lead to shrinkage of lesions, but she had significant intolerance with dizziness, presyncope, fatigue, hair loss, rash, GI side effects.      We have discussed at length the nature of desmoid fibromatosis tumors, the fact they can wax and wane independent of therapy, are not cancers in a metastatic potential, but can be locally infiltrative and cause substantial morbidity to the site of disease and occasionally mortality. I reviewed that the emerging standard of care is observation based on the fact that 20-30% will experience spontaneous regression in the year following diagnosis. Given her long experience with desmoids, complete regression may be less likely, but we will still approach this cautiously with parallel goals of avoidance of overtreatment (particularly surgical) and palliation of symptoms / preservation of function.     At first visit in 2022, her lesions are in the shoulder, right forearm, chest area, with a possible desmoid in her left thigh. Since then, she has had waxing waning course with significant pain issues, malnutrition requiring intermittent TPN and tube feeds, multiple GI bleeds, significant anxiety, depression, multiple hospitalizations. Had tumor response to sorafenib but developed GI bleeding and had to stop. IV iron infusion led to severe anaphylaxis.    09/16/22: Start nirogacestat.     09/25/22: Admitted with uncontrolled pain and muscle spasms after IR guided procedure. Prolonged hospital stay, neurologic evaluation, managed with muscle relaxers (baclofen, valium). Discharge 9/11 to rehab. Ramped up on nirogacestat with some side effects, discharged from rehab 10/2.    03/25/23 - 05/07/23: Prolonged, complex admission for pain, malnutrition. Found to have strep mitis blood culture. NG tube placed, TPN not recommended. Discharged to rehab, but then readmitted with C Diff, treated.    06/26/23: Scans with largely stable disease, although growing abdominal wall tumor.    4/26-5/11: Admitted with abdominal pain, hematochezia. She underwent iron infusion and EGD, discharged on budesonide. Syncope felt most consistent with POTS.    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 Recent hospital stay interrupted medication regimen, leading to side effects upon restarting, including headaches and nausea, which ought to improve over time. Continued nirogacestat is reasonable to control tumor growth and symptoms.  - Continue nirogacestat, increase to 100mg  BID  - Monitor for side effects, including headaches and oral ulcers  - Provide magic mouthwash for oral ulcers  - Provide topical lidocaine for pain management during port access    Recurrent gastrointestinal bleeding  Significant gastrointestinal bleeding with maroon stools. Previous endoscopy and CT enterography did not identify the source. Budesonide may help with inflammation but has not resolved bleeding. Consideration of double balloon enteroscopy at [facility] for  further evaluation. Significant distress due to bleeding impacts quality of life.  - Continue budesonide to manage inflammation  - Consult with [facility] for potential double balloon enteroscopy  - Monitor stool color and frequency  - Track hemoglobin and iron levels - notably Hgb is normal today    Iron deficiency anemia  Iron deficiency anemia secondary to recurrent gastrointestinal bleeding. Recent iron infusion during hospital stay. Monitoring of hemoglobin and iron levels is necessary due to ongoing bleeding.  - Monitor hemoglobin and iron levels regularly  - Consider further iron supplementation as needed    Cancer-related pain  Significant cancer-related pain managed with opioids. Recent hospital stay involved tapering of pain medications. Pain management is crucial to improve quality of life and prevent syncope episodes. Long-term opioid use is not ideal due to gastrointestinal effects. Encouraged to slowly taper opioids to minimize withdrawal and manage pain effectively.  - Continue current pain management regimen  - Taper opioids slowly to minimize withdrawal and manage pain effectively  - Encourage non-pharmacological pain management strategies    Postural orthostatic tachycardia syndrome (POTS)  Episodes of syncope and tachycardia, likely exacerbated by pain and stress. Management of underlying conditions and pain is crucial to control POTS symptoms.  - Manage pain and stress to help control POTS symptoms    Oral ulcers  Oral ulcers with bleeding, possibly related to nirogacestat. No evidence of systemic steroid effect from budesonide on oral mucosa. Significant discomfort from oral ulcers, possibly exacerbated by nirogacestat.  - Provide magic mouthwash for oral ulcers  - Monitor for changes in oral ulceration    Malnutrition related to colectomy  Malnutrition due to colectomy. Recent improvement in appetite with budesonide, but ongoing weight loss despite increased intake, possibly due to malabsorption or increased metabolic demands.  - Monitor nutritional intake and weight  - Continue budesonide to support appetite  - Consider nutritional supplements if needed    I personally reviewed the medical records, pathology and laboratory results and viewed the imaging. All questions were answered to the patient's apparent satisfaction and they voiced understanding and agreement with the plan. Pt has my card and contact information and is encouraged to reach out with any further questions.    Emigdio Wildeman, MD/MPH  Assistant Professor  Bone and Soft Tissue Oncology Program  [facility]  Pager: [phone]  Nurse Navigator: Villa Greaser RN, BSN, OCN      REASON FOR CONSULTATION:   [name] [name] is a 23 y.o. female who is seen in consultation at the request of Vallie Gay Hebert Littler, MD for evaluation of her desmoid fibromatosis    HISTORY OF PRESENT ILLNESS:      Attends school at [employer] in [place], [place]      Right arm desmoid is the most bothersome - significant arm cramping, deep aching pain, lasts for ~30 min after cramping starts. Has noticed right hand shaking frequently. 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     Left chest lesion used to be quite uncomfortable, caused swelling in her arm and discomfort. Improved after cryoablation.     Right shoulder blade hurts when she leans back against it, fairly painful. Right chest wall lesion has some associated pain, improving since cryo.     First diagnosed at 23 years old - seen in [place] at first, had numerous cysts. First major issues were two paraspinal lesions, referred to [facility] for biopsy and ultimately resection.  Soon diagnosed with [name], seen by medical genetics  early on.  Guided by Dr. Laree Platts, referred out to proceduralists as needed.  2011 - had a growing lesion on her back as well as bilateral neck lesions, resected at [facility], found to be desmoid  02/2012 - surgery for thoracic spine desmoid. Maybe another flank lesion removed  2017 - wrist lesion treated by [facility] with bupivicaine, kenalog  08/2016 - endoscopy with adenomatous polyp of the colon, non-dysplastic  02/2017 - subcutaneous lesion resection by plastic surgery (postauricular and RUE, RLE) - path revealed epidermal inclusion cysts, osteomas  05/2018: cryotherapy of left anterior chest wall lesion  05/2018: started sulindac but struggled with adherence due to GI side effects  08/2018: cryo #2 to chest wall lesion  12/10/2018: started sorafenib due to progression in left paraspinal lesion, ?posterior chest wall lesion  02/2019: discontinued sorafenib for several reasons: fatigue, dizziness, nausea, hair loss, as well as COVID infection in late November. Derm consulted for rash / hair loss, started on antihistamines, topical steroids. Trialed lower dose but ultimately stopped again 05/2019 due to toxicity.  06/2019: follow up with surgical oncology - plans for observation rather than repeat surgery  07/2019: cryoablation of anterior chest wall desmoid  07/2019: excision of subcutaneous LE mass and L neck masses, clinically appeared consistent with cysts  08/2019: cryoablation of R posterior chest wall, left paraspinal desmoid     Interval History:  History of Present Illness  [name] [name] is a 23 year old female with desmoid fibromatosis related to familial adenomatous polyposis ([name]) who presents for routine follow-up.    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 She experiences significant cancer-related pain, which impacts her daily life.    She has a history of malnutrition related to her colectomy, iron deficiency anemia, and recurrent gastrointestinal bleeding. She notes ongoing issues with GI bleeding, describing her stool as maroon in color, and this has been a persistent issue for several years. She is currently taking budesonide, three pills once a day, for patchy inflammation in her GI tract and has noticed an improvement in her appetite since starting budesonide.    She experiences postural orthostatic tachycardia syndrome (POTS) with frequent syncope episodes and significant heart rate increase upon standing. She has been experiencing seizure-like episodes following anesthesia and has been sedated multiple times during hospital stays.    She has oral ulcers around her gums that bleed intermittently, which she suspects may be related to her medication.    She experiences severe nausea and vomiting, particularly when riding in a car, significantly impacting her ability to leave the house. Despite eating more, she continues to lose weight, having dropped from 118 pounds to a lower weight recently.    She has been experiencing migraines, which have been severe enough to prevent her from using her phone or watching TV. She found relief with a combination of acetaminophen, caffeine, magnesium, and IV fluids during a recent hospital stay.    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 She uses a wheelchair and AFOs for mobility. She is concerned about the healing of her port incision and has been keeping it covered.    REVIEW OF SYSTEMS:  A comprehensive review of 12 systems was negative except for pertinent positives noted in HPI.    Past Medical History, Surgical History, Family History were reviewed personally. Any changes were updated as above.    Social History:  Attended [employer] in [place], has been interrupted by health issues  Wants to be a nurse in pediatric hematology/oncology   Mother Dana Duncan is a strong advocate, often present at visits    Pregnancy status: premenopausal, interested in future children    ALLERGIES/MEDICATIONS:  Reviewed in Epic    PHYSICAL EXAM:   BP 124/79  - Pulse 77  - Resp 18  - Ht 165.1 cm (5' 5)  - Wt 53.7 kg (118 lb 4.8 oz)  - LMP  (LMP Unknown)  - SpO2 100%  - BMI 19.69 kg/m??   ECOG 1  Gen - well appearing, mother accompanying her   Eyes - conjunctivae clear, PERRL  ENT -no scleral icterus, moist mucous membranes  Lymph - no cervical or supraclavicular lymphadenopathy appreciated  Resp - clear to auscultation bilaterally, no wheezes, crackles or rales  CV - normal rate, regular rhythm, no lower extremity edema noted  Skin - flushing of skin noted on chest  Neuro - AOx3, EOM intact, no facial droop, speech fluent and coherent, moves all extremities without asymmetry  Psych - affect anxious  MSK - no joint tenderness or swelling    PATHOLOGY:   Pathology was personally reviewed as described in the HPI, detailed in Epic.     LABS:  Reviewed in Epic    RADIOLOGY:  Imaging was personally viewed and interpreted as summarized in the history (see above).

## 2023-07-31 ENCOUNTER — Other Ambulatory Visit: Admit: 2023-07-31 | Discharge: 2023-08-01 | Payer: BLUE CROSS/BLUE SHIELD

## 2023-07-31 ENCOUNTER — Ambulatory Visit: Admit: 2023-07-31 | Discharge: 2023-08-01 | Payer: BLUE CROSS/BLUE SHIELD

## 2023-07-31 ENCOUNTER — Encounter: Admit: 2023-07-31 | Discharge: 2023-08-01 | Payer: BLUE CROSS/BLUE SHIELD

## 2023-07-31 ENCOUNTER — Inpatient Hospital Stay: Admit: 2023-07-31 | Discharge: 2023-08-01 | Payer: BLUE CROSS/BLUE SHIELD

## 2023-07-31 DIAGNOSIS — K91850 Pouchitis: Principal | ICD-10-CM

## 2023-07-31 DIAGNOSIS — I82A11 Acute embolism and thrombosis of right axillary vein: Principal | ICD-10-CM

## 2023-07-31 DIAGNOSIS — E860 Dehydration: Principal | ICD-10-CM

## 2023-07-31 DIAGNOSIS — G43E09 Chronic migraine with aura without status migrainosus, not intractable: Principal | ICD-10-CM

## 2023-07-31 DIAGNOSIS — D48119 Desmoid tumor: Principal | ICD-10-CM

## 2023-07-31 DIAGNOSIS — D1391 FAP (familial adenomatous polyposis): Principal | ICD-10-CM

## 2023-07-31 DIAGNOSIS — D500 Iron deficiency anemia secondary to blood loss (chronic): Principal | ICD-10-CM

## 2023-07-31 DIAGNOSIS — Z79899 Other long term (current) drug therapy: Principal | ICD-10-CM

## 2023-07-31 DIAGNOSIS — D6859 Other primary thrombophilia: Principal | ICD-10-CM

## 2023-07-31 DIAGNOSIS — G893 Neoplasm related pain (acute) (chronic): Principal | ICD-10-CM

## 2023-07-31 LAB — CBC W/ AUTO DIFF
BASOPHILS ABSOLUTE COUNT: 0.1 10*9/L (ref 0.0–0.1)
BASOPHILS RELATIVE PERCENT: 0.7 %
EOSINOPHILS ABSOLUTE COUNT: 0.2 10*9/L (ref 0.0–0.5)
EOSINOPHILS RELATIVE PERCENT: 2.7 %
HEMATOCRIT: 36.8 % (ref 34.0–44.0)
HEMOGLOBIN: 12.3 g/dL (ref 11.3–14.9)
LYMPHOCYTES ABSOLUTE COUNT: 2.6 10*9/L (ref 1.1–3.6)
LYMPHOCYTES RELATIVE PERCENT: 34.1 %
MEAN CORPUSCULAR HEMOGLOBIN CONC: 33.4 g/dL (ref 32.0–36.0)
MEAN CORPUSCULAR HEMOGLOBIN: 29 pg (ref 25.9–32.4)
MEAN CORPUSCULAR VOLUME: 86.9 fL (ref 77.6–95.7)
MEAN PLATELET VOLUME: 10.1 fL (ref 6.8–10.7)
MONOCYTES ABSOLUTE COUNT: 0.7 10*9/L (ref 0.3–0.8)
MONOCYTES RELATIVE PERCENT: 9.1 %
NEUTROPHILS ABSOLUTE COUNT: 4 10*9/L (ref 1.8–7.8)
NEUTROPHILS RELATIVE PERCENT: 53.4 %
PLATELET COUNT: 247 10*9/L (ref 150–450)
RED BLOOD CELL COUNT: 4.24 10*12/L (ref 3.95–5.13)
RED CELL DISTRIBUTION WIDTH: 14.9 % (ref 12.2–15.2)
WBC ADJUSTED: 7.5 10*9/L (ref 3.6–11.2)

## 2023-07-31 MED ORDER — MAGIC MOUTHWASH ORAL SUSPENSION
Freq: Four times a day (QID) | ORAL | 0 refills | 3.00000 days | Status: CP
Start: 2023-07-31 — End: ?

## 2023-07-31 MED ORDER — LIDOCAINE 5 % TOPICAL OINTMENT
TOPICAL | 3 refills | 0.00000 days | Status: CP
Start: 2023-07-31 — End: 2024-07-30

## 2023-07-31 MED ORDER — ONDANSETRON 8 MG DISINTEGRATING TABLET
ORAL_TABLET | Freq: Three times a day (TID) | 2 refills | 10.00000 days | Status: CP | PRN
Start: 2023-07-31 — End: ?

## 2023-07-31 MED ADMIN — sodium chloride (NS) 0.9 % infusion: 1000 mL/h | INTRAVENOUS | @ 20:00:00 | Stop: 2023-07-31

## 2023-07-31 NOTE — Unmapped (Signed)
 Administered phenergan per order  Admin benedryl for neck and chest rash, per order. Pt was assessed by APP Dina.  Pt tolerated meds well, no concerns.  Care of pt resumed with primary RN Basilia Lima

## 2023-07-31 NOTE — Unmapped (Signed)
 Patient was handed off from [place], [place]  to chair 48 for IV and antiemetics. Port intact with + BR. Labs drawn, treatment plan reviewed and premedicated. Tolerated treatment well, AVS decline, port was de-access/ heparin lock.  Pt was discharged home in stable condition.

## 2023-07-31 NOTE — Unmapped (Signed)
 RED ZONE Means: RED ZONE: Take action now!     You need to be seen right away  Symptoms are at a severe level of discomfort    Call 911 or go to your nearest  Hospital for help     - Bleeding that will not stop    - Hard to breathe    - New seizure - Chest pain  - Fall or passing out  -Thoughts of hurting    yourself or others      Call 911 if you are going into the RED ZONE                  YELLOW ZONE Means:     Please call with any new or worsening symptom(s), even if not on this list.  Call [phone]  After hours, weekends, and holidays - you will reach a long recording with specific instructions, If not in an emergency such as above, please listen closely all the way to the end and choose the option that relates to your need.   You can be seen by a provider the same day through our Same Day Acute Care for Patients with Cancer program.      YELLOW ZONE: Take action today     Symptoms are new or worsening  You are not within your goal range for:    - Pain    - Shortness of breath    - Bleeding (nose, urine, stool, wound)    - Feeling sick to your stomach and throwing up    - Mouth sores/pain in your mouth or throat    - Hard stool or very loose stools (increase in       ostomy output)    - No urine for 12 hours    - Feeding tube or other catheter/tube issue    - Redness or pain at previous IV or port/catheter site    - Depressed or anxiety   - Swelling (leg, arm, abdomen,     face, neck)  - Skin rash or skin changes  - Wound issues (redness, drainage,    re-opened)  - Confusion  - Vision changes  - Fever >100.4 F or chills  - Worsening cough with mucus that is    green, yellow, or bloody  - Pain or burning when going to the    bathroom  - Home Infusion Pump Issue- call    [phone]         Call your healthcare provider if you are going into the YELLOW ZONE     GREEN ZONE Means:  Your symptoms are under controls  Continue to take your medicine as ordered  Keep all visits to the provider GREEN ZONE: You are in control  No increase or worsening symptoms  Able to take your medicine  Able to drink and eat    - DO NOT use MyChart messages to report red or yellow symptoms. Allow up to 3    business days for a reply.  -MyChart is for non-urgent medication refills, scheduling requests, or other general questions.         [id number] Rev. 09/06/2021  Approved by Oncology Patient Education Committee     Lab on 07/31/2023   Component Date Value Ref Range Status    ABO Grouping 07/31/2023 AB POS   Final    Antibody Screen 07/31/2023 NEG   Final    WBC 07/31/2023 7.5  3.6 - 11.2 10*9/L Final  RBC 07/31/2023 4.24  3.95 - 5.13 10*12/L Final    HGB 07/31/2023 12.3  11.3 - 14.9 g/dL Final    HCT 16/12/9602 36.8  34.0 - 44.0 % Final    MCV 07/31/2023 86.9  77.6 - 95.7 fL Final    MCH 07/31/2023 29.0  25.9 - 32.4 pg Final    MCHC 07/31/2023 33.4  32.0 - 36.0 g/dL Final    RDW 54/11/8117 14.9  12.2 - 15.2 % Final    MPV 07/31/2023 10.1  6.8 - 10.7 fL Final    Platelet 07/31/2023 247  150 - 450 10*9/L Final    Neutrophils % 07/31/2023 53.4  % Final    Lymphocytes % 07/31/2023 34.1  % Final    Monocytes % 07/31/2023 9.1  % Final    Eosinophils % 07/31/2023 2.7  % Final    Basophils % 07/31/2023 0.7  % Final    Absolute Neutrophils 07/31/2023 4.0  1.8 - 7.8 10*9/L Final    Absolute Lymphocytes 07/31/2023 2.6  1.1 - 3.6 10*9/L Final    Absolute Monocytes 07/31/2023 0.7  0.3 - 0.8 10*9/L Final    Absolute Eosinophils 07/31/2023 0.2  0.0 - 0.5 10*9/L Final    Absolute Basophils 07/31/2023 0.1  0.0 - 0.1 10*9/L Final

## 2023-07-31 NOTE — Unmapped (Signed)
 Telephone call to pt's mom for follow up. Per Dr. Ginnie Laine, pt to follow up with oncologist and if the oncologist deems it necessary, they can send referral to [facility] for double balloon evaluation for GI bleeding

## 2023-07-31 NOTE — Unmapped (Signed)
 Brief Adult Oncology Infusion Clinic Note:    Paged regarding:Nausea and back itching   Ms.[name] [name] is a 23 y.o. 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 infusion [facility] for IV fluids ISO poor PO intake and recent history of GI Bleed. Pt complains of nausea and diffuse rash itching       - IV Zofran 8 mg once     - IV Benadryl 25 mg once    I personally spent 30 minutes face-to-face and non-face-to-face in the care of this patient, which includes all pre, intra, and post visit time on the date of service.     Russel Courser  Medicine of Oncology - Infusion Program

## 2023-07-31 NOTE — Unmapped (Signed)
 Telephone call from pt's mom. She continues to have GI bleed. They had a conversation with dr. Ginnie Laine when pt was admitted and plan was for double balloon entereroscopy which is available at [facility]. She wants to know if Dr. Ginnie Laine has any contacts at [facility]. I will forward the message to MD for advise

## 2023-07-31 NOTE — Unmapped (Signed)
 Patient arrived to Chair 32.  Patient port already accessed upon arrival to unit, with brisk blood return.  Hydration administered, as ordered.  Patient tolerated well.  Patient declined AVS. Patient care transferred to [facility], RN to await medication IVP.  Patient in stable condition

## 2023-08-01 DIAGNOSIS — D48119 Desmoid tumor: Principal | ICD-10-CM

## 2023-08-01 MED ADMIN — diphenhydrAMINE (BENADRYL) injection: 25 mg | INTRAVENOUS | Stop: 2023-07-31

## 2023-08-01 MED ADMIN — promethazine (PHENERGAN) 12.5 mg in sodium chloride (NS) 0.9 % 50 mL IVPB: 12.5 mg | INTRAVENOUS | Stop: 2023-07-31

## 2023-08-04 DIAGNOSIS — D48119 Desmoid tumor: Principal | ICD-10-CM

## 2023-08-04 MED ORDER — LIDOCAINE 4 % TOPICAL PATCH
MEDICATED_PATCH | Freq: Every day | TRANSDERMAL | 0 refills | 30.00000 days
Start: 2023-08-04 — End: ?

## 2023-08-04 MED ORDER — LIDOCAINE 5 % TOPICAL OINTMENT
3 refills | 0.00000 days
Start: 2023-08-04 — End: 2024-08-03

## 2023-08-04 NOTE — Unmapped (Signed)
 Chronic Pain Follow Up Note    Assessment and Plan  [name] [name] is a 23 y.o. female with past medical history significant for DVT (RUE, s/p Eliquis x3 months), [name] syndrome s/p colectomy, desmoid tumor, anemia, and severe protein-calorie malnutrition who is being followed at [facility] clinic for abdominal and right shoulder pain that is consistent with the diagnosis of cancer associated pain.    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 Pt has continued and now chronic pain related to her tumors, has R shoulder pain as well though this is better controlled now. Follows with [facility] Dr Vallie Gay as well as by [facility] oncology.    Unfortunately, [name] [name] has been experiencing continued severe abdominal pain. The etiology of her pain flare cycles is complex with likely multiple contributing factors.  The most likely explanation appears that she has intermittent pain flares that are caused by an unclear underlying process (potentially due to intermittent intestinal obstructions in the setting of her known mesenteric desmoid tumors, which may act as anchor points for torsion of her bowels) that resulted in an increased opioid requirement and often will lead to hospitalization.  At this point, her acute pain will be treated with IV opioids which will precipitate narcotic bowel syndrome that, in conjunction with her severe visceral hyperalgesia, quickly causes a new pain generator.  This results in a positive feedback loop with deleterious consequences in which she requires more IV opioids to treat her pain but the additional IV opioids further the underlying issue.    Visceral hyperalgesia, narcotic bowel syndrome, desmoid tumors of the gastrointestinal tract, cancer related pain, chronic pain syndrome, allodynia, generalized abdominal pain   Chronic, ongoing; at this time patient believes pain has improved since recent hospitalization. Patient has been admitted several times for extended periods of time over the last year often for either pouchitis versus poor p.o. intake versus intermittent bowel obstruction versus pain flares. Persistent issues with her perception of bleeding through the GI tract and poor p.o. intake. She was taking oxycodone 20 mg TID with an additional 10 mg once daily with benefit. On average, over the past 17 days, she has been taking about 20 mg TID, though noting that she often needs it up to 5 times daily at 20 mg per dose. We discussed staying stable over this next month with enough for 20 mg up to 5 times daily in case a flare occurs. However, her next fill will provide only enough for 20 mg TID with plans to further wean from there. Goal is to be off of oxycodone entirely. She was very optimistic about this. She denies any adverse side effects while she took 20 mg q 4 hours previously. She notes continued benefit from [facility] patch without side effects and we refilled this medication today. We will follow-up in 8 weeks in person for a pill count and follow-up visit where we will also plan wean oxycodone further. Of note, she has been experiencing more syncopal episodes since recent hospitalization and discussed following with Cardiology while drinking plenty of fluids.     -Continue to follow with Cardiology regarding syncopal episodes   -Continue APAP 1000 mg 3 times daily  -Okay to continue ativan 0.5 mg p.o. prn for anxiety, patient will not take it at the same time as opioids or other sedating medications, takes infrequently  -Refilled oxycodone 10 mg tablets at 20 mg  q4h prn, max 10 tabs per day. Refilled x2  -Cotninue Butrans 20mg  weekly, refilled x3 months  -Continue Lidocaine ointment  -Still working to wean off of all opioids    Acute right upper extremity pain  Radial nerve distrubution pain likely due to known desmoid and flare up due to overuse. Negative for carpal tunnel. Describes symptoms consistent with flare of chronic R ulnar neuropathy as well.     - Discussed minimizing writing by hand and trying voice-to-text and OTT writing tool.   - Continue Lidocaine ointment  - Start Voltaren gel    Myofascial pain   Chronic, ongoing, at goal. Not primary concern at this time.   - Consider repeat TPI PRN, though will hold off given her severe pain flare after recent desmoid injection  - Home exercise program  - Encouraged SI joint exercises  - Encouraged slow resumption of normal activity and times when pain has decreased  - Apply Voltaren gel BID to limit GI side effects  - Apply Lidocaine patches, refilled  - Baclofen 5 mg 3 times daily, refilled x3 months    Headaches  Patient endorses worsened headaches at today's visit. We discussed starting Topamax to provide further analgesia. Discussed risks of weight loss, though it tends to be <5% and she will stop if it occurs, also risk of sleepiness, kidney stones, blurry vision.     - Start Topamax 25 mg BID  - Will consider referral to [facility] if Topamax does not provide benefit.     Chronic, continuous use of opioids; chronic pain syndrome   Patient is managed on both Butrans patches for long-acting pain control as a partial mu agonist and oxycodone for pain flares. Working to wean patient off of oxycodone and then to work patient down and eventually off of buprenorphine to improve bowel function while still balancing the need for pain control.  She has successfully weaned to oxycodone 20 mg TID on average over the past 17 days. She still has severe abdominal pain, especially when having GI bleeds, so we discussed trying to further wean off opioids; she expressed understanding. Patient is managed on a benzodiazepine for anxiety and muscle spasms but notes that she is only taking 1 pill three times a week and knows not to take this around the time of her opioid and is working to wean this off as well.    I have reviewed the [facility] Medical Board statement on use of controlled substances for the treatment of pain as well as the CDC Guideline for Prescribing Opioids for Chronic Pain. I have reviewed the [place] Controlled Substance Monitoring Database.    Long Term Use of Opioid Medication   - Compliant, using opioid to facilitate functional goals, no concerns for misuse     PDMP reviewed 08/06/2023 Appropriate   Pill count 08/06/2023 Appropriate 42 tabs of oxycodone left  49 suboxone films left    Urine toxicology 12/23/22 Appropriate +oxy, +benzo, +buprenorphine   Treatment agreement 02/18/23 Goals: continue with nursing school   Naloxone rx Date 12/23/22     COMM 08/05/23 7 - higher due to recent hospitalization   Benzodiazepine Yes Ok with minimal use per psychiatry, pt will avoid taking close to opioid doses and only while awake, not near bedtime   Pain Psychology No    QTC 11/01/22 NSR, Qtc 437   Concerns yes OIH, planning to wean off opioids, planning to minimize benzo use   Opioid Risk MODERATE- see comment Given dose and  higher COMM, but working to wean and patient is amenable     Opioid Changes and Yearly MME  Date MME Comments   12/23/22 45 + 1.5 mg BID of Suboxone Post-discharge regimen, increased suboxone to decrease oxy   01/21/23 45 + 1 mg TID Suboxone + additional 10 mg oxy per day x 10 days Split Suboxone to TID dosing for pain, gave additional #10 oxy 10 mg for additional 10 mg oxy per day for 10 days after wisdom tooth extractions   02/18/23 75 + 20 mcg/h Butrans Increased to oxy 10 mg x4-5 per day for 2-3 weeks given patient's current pain flare in setting of dramatically increased BM frequency, switched to Butrans given pt's anxiety surrounding Suboxone and desire for more consistent analgesia with Butrans that will also minimize pill burden; plan to wean off of oxy hereafter, [employer] is a Scientist, product/process development net decrease from Suboxone   05/07/23 180 + 20 mcg/h Butrans Discharged on 20 mg liquid oxycodone q4h PRN + Butrans 20 mcg/h given severe pain that has persisted as an outpatient and thus requirement remains, she is willing to work towards weaning   08/05/23 90-150 + 20 mch/hr Butrans Higher than historical average given recent pain flare and admission but lower than last visit, working to wean     Return for 6-8 weeks with MD.    PLAN:  Today I have prescribed:  Requested Prescriptions     Signed Prescriptions Disp Refills    buprenorphine (BUTRANS) 20 mcg/hour PTWK transdermal patch 4 patch 2     Sig: Place 1 patch on the skin once a week.    lidocaine (ASPERCREME) 4 % patch 30 patch 0     Sig: Place 1 patch on the skin daily. Apply for 12 hours then remove for 12 hours.    lidocaine (XYLOCAINE) 5 % ointment 37 g 3     Sig: Apply to affected area daily    oxyCODONE 10 mg TbOr 240 tablet 0     Sig: Take 20 mg by mouth every six (6) hours. Taper as discussed in clinic DNF: 08/19/22    oxyCODONE 10 mg TbOr 180 tablet 0     Sig: Take 20 mg by mouth three (3) times a day. Taper as discussed in clinic DNF: 09/17/22    topiramate (TOPAMAX) 25 MG capsule 60 capsule 2     Sig: Take 1 capsule (25 mg total) by mouth two (2) times a day.     No orders of the defined types were placed in this encounter.     HPI  [name] [name] is a 23 y.o. being followed at [facility] clinic for complaint of chronic pain localized to her shoulders and abdomen.      Patient was last seen in virtually in April, at which point the patient reported still having pain flares that were residual from her last hospitalization, but slightly improved. She reported pain fluctuates daily. She was taking oxycodone 20 mg liquid every 4 hours on some days, 15 mg TID on other days and less on good days such as today she had only one dose of oxy 15 mg. She noted she was able to tolerate PO oxycodone tabs, she did not have no more NGT. She endorsed benefit from oxycodone and butrans patches without any associated adverse side effects.     Since last visit, the patient has followed with Hem Onc and IR. The patient was admitted to the hospital from 07/04/23 - 07/19/23  for syncope. Underwent Endo eval SM Intest pouch on 07/06/23 and small intestinal endoscopy on 07/15/23.     Today, the patient presents to the clinic with ongoing pain since last visit. She saw Dr. Vallie Gay since he recent hospitalization and the patient states it went well. She reports they increased the dose on nirogacestat. Also has been having more syncopal episodes and dizzy spells, so she came into clinic in a wheelchair today. She is able to ambulate, though tends to walk with another person due to these new symptoms. She still endorses a lot of GI bleeding. She is not taking any medications for her headaches, though endorses continued headache pain.    A new pain generator at today's visit is shooting pain from elbow into right forearm. Her service dog pounced on her and her port, though she is unsure if this is actually causing this shooting pain. She has been typing a lot for paperwork and writing more often which could be contributing to this new pain. This pain began a couple days ago. She has noticed her hand locking up more. Her most recent steroid injection did not provide any benefit. She started wrapping a lidocaine patch around the area of pain when she is writing a lot that has helped. She has considered using an OT Tool to help with writing. Talked about voice to text to write more often and starting Voltarren gel to provide some relief to the forearm area.     In terms of medications, the patient states she has been taking oxycodone 20 mg TID and 10 mg once daily on average and using Butrans patch 20 mcg/hr with benefit. She has been having more difficulty with sleeping, though she is proud of the progress she has made since her recent hospitalization.       Current analgesic regimen:  -Butrans patch 20 mcg/h  -Oxycodone 20 mg q4h PRN liquid  -P.o. Valium 5 mg nightly as needed  -Tylenol PRN  -Voltaren 1% gel    The patient states her pain is located abdomen and the severity of her pain ranges from 3/10 to 10/10.  Her pain currently is 6/10 and on average is 7/10.  She describes the sensation of her pain as pulling, sore, throbbing. Her pain is present all of the time and worst evenings. The patient???s pain impacts enjoyment of life, general activity, normal work, recreational activities, sleep, walking. Her interval history includes hospitalization, ER visit, PCP visit. Her pain has stayed the same, and she does have new pain to discuss today. She is not on blood thinners or anti-coagulants. In regards to medications currently taken for pain management, the patient is tolerating these medications well and complains of associated side effects: None.      Previous Medication Trials:  NSAIDS: Toradol (c/f GIB after)  Antidepressant/anxiety: Cymbalta (stopped due to mood side effects), amitriptyline (insomnia and increased J-pouch output)  Anticonvulsants: Gabapentin, Lyrica (both cause lower extremity edema)  Muscle relaxants: Robaxin, baclofen (most effective of the conventional therapies), p.o. Valium (attempting to limit due to opioid dependence)  Topicals: lidocaine, voltaren gel  Short-acting opiates: oxycodone, PO HM  Long-acting opiates: Buprenorphine  Other: Tylenol    Previous Interventions  Procedures:   - TPIs in August 2023 with initial pain flare, subsequent improvement, and near immediate relief flare of pain; hyperalgesic with procedure  - TPIs of Right trapezius, rhomboid, infraspinatus on 12/04/2021.  - TPIs on 10/25  - Celiac plexus block on 11/1 (severe post-procedural pain flare  requiring admission for pain control, significant hyperalgesia)  - TPIs 11/22    Infusions:  Lidocaine infusions helped during inpatient exacerbations of pain more than ketamine infusions    PT: Yes    Allergies  Allergies   Allergen Reactions    Adhesive Tape-Silicones Hives and Rash     Paper tape and Tegederm ok. Biopatch causes blistering but has tolerated CHG Tegaderm    Ferrlecit [Sodium Ferric Gluconat-Sucrose] Swelling and Rash    Levofloxacin Swelling and Rash     Swelling in mouth, rash,     Methylnaltrexone      Per Patient: I lost bowel control, severe abdominal cramping, and elevated BP    Neomycin Swelling     Rxn after ear drops; ear swelling    Papaya Hives    Morphine Nausea And Vomiting    Zosyn [Piperacillin-Tazobactam] Itching and Rash     Red and itchy    Compazine [Prochlorperazine] Other (See Comments)     Extreme agitation    Iron Analogues     Reglan [Metoclopramide Hcl] Diarrhea    Iron Dextran Itching     Received iron dextran 06/08/22 over 12 hours, had itching and redness/flushing during the infusion and for a couple days after. Required IV benadryl w/flares between doses and ultimately treated w/IV methylpred for 2 days.     Latex, Natural Rubber Rash       Home Medications    Current Outpatient Medications   Medication Sig Dispense Refill    acetaminophen (TYLENOL EXTRA STRENGTH) 500 MG tablet Take 2 tablets (1,000 mg total) by mouth every eight (8) hours.      baclofen (LIORESAL) 10 MG tablet Take 0.5 tablets (5 mg total) by mouth Three (3) times a day. 45 tablet 0    budesonide (ENTOCORT EC) 3 mg 24 hr capsule Take 3 capsules (9 mg total) by mouth daily. 90 capsule 1    butalbital-acetaminophen-caffeine (ESGIC) 50-325-40 mg per tablet Take 1 tablet by mouth two (2) times a day as needed for headache. 20 tablet 0    cetirizine (ZYRTEC) 10 MG tablet Take 1 tablet (10 mg total) by mouth two (2) times a day. After iron infusions      escitalopram oxalate (LEXAPRO) 5 MG tablet Take 1 tablet (5 mg total) by mouth daily. 30 tablet 0    famotidine (PEPCID) 20 MG tablet Take 1 tablet (20 mg total) by mouth nightly. 30 tablet 0    fluticasone propionate (FLONASE) 50 mcg/actuation nasal spray Use 1 spray under the butrans patch once every 7 days to prevent allergic reaction to the adhesive      LORazepam (ATIVAN) 0.5 MG tablet Take 1 tablet (0.5 mg total) by mouth two (2) times a day as needed for anxiety. 10 tablet 0    magic mouthwash Take 10 mL by mouth four (4) times a day. 120 mL 0    naloxegol (MOVANTIK) 12.5 mg tablet Take 1 tablet (12.5 mg total) by mouth daily as needed (constipation).      nirogacestat (OGSIVEO) 100 mg tablet Take 1 tablet (100 mg total) by mouth daily. Swallow tablets whole; do not break, crush, or chew. 30 tablet 5    ondansetron (ZOFRAN-ODT) 8 MG disintegrating tablet Dissolve 1 tablet (8 mg total) in the mouth every eight (8) hours as needed for nausea. 30 tablet 2    oxyCODONE (ROXICODONE) 10 mg immediate release tablet Take 2 tablets (20 mg total) by mouth Five (5) times daily as needed  for pain. 210 tablet 0    pantoprazole (PROTONIX) 40 MG tablet Take 1 tablet (40 mg total) by mouth Two (2) times a day (30 minutes before a meal). 60 tablet 0    promethazine (PHENERGAN) 12.5 MG tablet Take 1 tablet (12.5 mg total) by mouth as needed for nausea. 30 tablet 0    scopolamine (TRANSDERM-SCOP) 1 mg over 3 days Place 1 patch (1 mg total) on the skin every third day. 10 patch 0    [START ON 08/19/2023] buprenorphine (BUTRANS) 20 mcg/hour PTWK transdermal patch Place 1 patch on the skin once a week. 4 patch 2    lidocaine (ASPERCREME) 4 % patch Place 1 patch on the skin daily. Apply for 12 hours then remove for 12 hours. 30 patch 0    lidocaine (XYLOCAINE) 5 % ointment Apply to affected area daily 37 g 3    [START ON 08/19/2023] oxyCODONE 10 mg TbOr Take 20 mg by mouth every six (6) hours. Taper as discussed in clinic DNF: 08/19/22 240 tablet 0    [START ON 09/17/2023] oxyCODONE 10 mg TbOr Take 20 mg by mouth three (3) times a day. Taper as discussed in clinic DNF: 09/17/22 180 tablet 0    topiramate (TOPAMAX) 25 MG capsule Take 1 capsule (25 mg total) by mouth two (2) times a day. 60 capsule 2     No current facility-administered medications for this visit.     Review Of Systems  General weight loss, weight gain, difficulty sleeping  Cardiovascular fainting, shortness of breath with exertion  Gastrointestinal bloody stool/black stool, nausea, and vomiting  Skin none  Endocrine none  Musculoskeletal none  Neurologic fainting, migraines/headaches  Psychiatric depression, anxiety    Physical Exam    VITALS:   Virtual visit     Wt Readings from Last 3 Encounters:   08/05/23 53.5 kg (118 lb)   07/31/23 53.7 kg (118 lb 4.8 oz)   07/20/23 54 kg (119 lb)     GENERAL:  The patient is well developed, well-nourished and appears to be in no apparent distress. The patient is pleasant and interactive. Patient is a good historian.  HEENT:    Reveals normocephalic/atraumatic. Clear sclera. Mucous membranes are moist.  RESPIRATORY:   Normal work of breathing.  No supplemental oxygen.  NEUROLOGIC:    The patient was alert and oriented, speech fluent, normal language.   Decreased sensation to light touch of radial distribution on the right  Decreased sensation to light touch of ulnar distribution on the right   Preserved median nerve on the right  MUSCULOSKELETAL:    Effort dependent: 4- grip strength on the right  Effort dependent: 3 wrist flexion on right  4+ wrist flexion on left   4+ left grip   4+ bilateral interosseous strength  SKIN:   No obvious rashes, lesions, or erythema.  PSYCHIATRIC:  Appropriate mood and affect.  No pressured speech.      Documentation assistance was provided by [name] Hippo Scribe, on Aug 05, 2023 at 10:22 AM for Dr. Roosevelt Coles, MD.    I saw and examined the patient. I reviewed the scribe's note and agree with the findings and plan of care as transcribed by the scribe.    Veta Govern, MD  Assistant Professor of Anesthesiology  Department of Anesthesiology   Divisions of General Anesthesiology and Pain Medicine

## 2023-08-05 ENCOUNTER — Ambulatory Visit
Admit: 2023-08-05 | Discharge: 2023-08-06 | Payer: BLUE CROSS/BLUE SHIELD | Attending: Student in an Organized Health Care Education/Training Program | Primary: Student in an Organized Health Care Education/Training Program

## 2023-08-05 DIAGNOSIS — G893 Neoplasm related pain (acute) (chronic): Principal | ICD-10-CM

## 2023-08-05 DIAGNOSIS — G43719 Chronic migraine without aura, intractable, without status migrainosus: Principal | ICD-10-CM

## 2023-08-05 DIAGNOSIS — R1084 Generalized abdominal pain: Principal | ICD-10-CM

## 2023-08-05 DIAGNOSIS — G894 Chronic pain syndrome: Principal | ICD-10-CM

## 2023-08-05 MED ORDER — LIDOCAINE 4 % TOPICAL PATCH
MEDICATED_PATCH | Freq: Every day | TRANSDERMAL | 0 refills | 30.00000 days | Status: CP
Start: 2023-08-05 — End: ?

## 2023-08-05 MED ORDER — TOPIRAMATE 25 MG SPRINKLE CAPSULE
ORAL_CAPSULE | Freq: Two times a day (BID) | ORAL | 2 refills | 30.00000 days | Status: CP
Start: 2023-08-05 — End: 2023-11-03

## 2023-08-05 MED ORDER — LIDOCAINE 5 % TOPICAL OINTMENT
TOPICAL | 3 refills | 0.00000 days | Status: CP
Start: 2023-08-05 — End: 2024-08-03

## 2023-08-05 MED ORDER — BUPRENORPHINE 20 MCG/HOUR WEEKLY TRANSDERMAL PATCH
MEDICATED_PATCH | TRANSDERMAL | 2 refills | 28.00000 days
Start: 2023-08-05 — End: 2023-10-28

## 2023-08-05 NOTE — Unmapped (Signed)
 Medication: Buprenorphine 20 mcg   SIG: Place 1 patch on the skin every 7 days   Quantity on RX: # 4  Filled on: 06/12/23  Pill count today: # 2      Medication: Oxycodone 10 mg ( from Hospital stay)  SIG: Take 2 tablets by mouth five times daily as needed for pain  Quantity on RX: # 210  Filled on: 07/19/23  Pill count today: # 107

## 2023-08-05 NOTE — Unmapped (Signed)
 Patient declined printed AVS but can review on MyChart. Patient educated on how to locate AVS. Patient voiced understanding.

## 2023-08-05 NOTE — Unmapped (Signed)
 It was good to see you today!    Given the persistent pain severity and in order to facilitate you tolerating the increase in your chemo all while awaiting your cardiology appointment to identify factors to improve your dysautonomia, we will keep your current pain regimen unchanged for 1 month.  You can take 20 mg of oxycodone up to 4 times per day as needed but try to minimize this as much as able.  Starting with your next refill you will then only have enough for 20 mg up to 3 times daily.  I refilled your Butrans patch for 3 months.  Both your next Butrans refill and oxycodone refill will both be available on 08/19/2023.    Regarding your right arm, I think that this pain will improve over time and may be due to a number of different things including overuse and your service dog.  Try to use voice-to-text is much as possible, use the riding tool that OT gave you, and apply Voltaren gel up to 4 times daily to your right wrist.  You can continue to use lidocaine as well.    For your headaches, I sent in a medication called topiramate that you can take 25 mg nightly for 1 week and then go up to 25 mg twice daily and this will hopefully decrease the frequency of your headaches.  If you need to stop because of side effects, then go back down to 25 mg nightly for a week and then stop.    We will see you back in 6 to 8 weeks.

## 2023-08-06 DIAGNOSIS — D48119 Desmoid tumor: Principal | ICD-10-CM

## 2023-08-06 MED ORDER — BACLOFEN 10 MG TABLET
ORAL_TABLET | Freq: Three times a day (TID) | ORAL | 0 refills | 30.00000 days | Status: CP
Start: 2023-08-06 — End: 2023-08-06

## 2023-08-07 DIAGNOSIS — D48119 Desmoid tumor: Principal | ICD-10-CM

## 2023-08-07 MED ORDER — LIDOCAINE HCL 2 % MUCOSAL SOLUTION
1 refills | 0.00000 days | Status: CP
Start: 2023-08-07 — End: ?

## 2023-08-10 DIAGNOSIS — D48119 Desmoid tumor: Principal | ICD-10-CM

## 2023-08-10 MED ORDER — OXYCODONE 10 MG TABLET,ORAL ONLY (NOT FEEDING TUBES)
ORAL_TABLET | ORAL | 0 refills | 0.00000 days
Start: 2023-08-10 — End: ?

## 2023-08-10 MED ORDER — NIROGACESTAT 100 MG TABLET
ORAL_TABLET | Freq: Every day | ORAL | 5 refills | 30.00000 days | Status: CP
Start: 2023-08-10 — End: ?
  Filled 2023-08-19: qty 28, 14d supply, fill #0

## 2023-08-10 NOTE — Unmapped (Signed)
 This pharmacist was notified by a technician that this patient has increased the dose on their Ogsiveo.. I have reviewed the patient's medical record and have determined that no further pharmacist action is needed. Mom was counseled on dose change on 07/31/23 (see EPIC note) and has already increased dose to 100mg  BID.      Approximate time spent: 5-10 minutes    Joseph Nickel, PharmD, Clinical Specialty Pharmacist  [facility]

## 2023-08-10 NOTE — Unmapped (Addendum)
 The [facility] has made a second and final attempt to reach this patient to refill the following medication:Ogsiveo 100mg .      We have left voicemails on the following phone numbers: [phone], have sent a MyChart message, have sent a text message to the following phone numbers: [phone], and have sent a Mychart questionnaire..    Dates contacted: 5/27,6/2  Last scheduled delivery: 07/29/23    The patient may be at risk of non-compliance with this medication. The patient should call the [facility] at [phone]  Option 4, then Option 1: Oncology to refill medication.    [facility] Specialty Technician

## 2023-08-10 NOTE — Unmapped (Signed)
 [facility] Delivery Pharm[facility] Refill Coordination Note    Specialty Medication(s) to be Shipped:   Hematology/Oncology: Ogsiveo    Other medication(s) to be shipped: No additional medications requested for fill at this time     Tvisha Schwoerer, DOB: 2000-05-18  Phone: [phone] (home) [phone] (work)      All above HIPAA information was verified with patient's family member, mother.     Was a Nurse, learning disability used for this call? No    Completed refill call assessment today to schedule patient's medication shipment from the [facility]  [phone]).  All relevant notes have been reviewed.     Specialty medication(s) and dose(s) confirmed: Patient reports changes to the regimen as follows: dose increased to 100mg  BID   Changes to medications: Kesa reports starting the following medications: topiramate  Changes to insurance: No  New side effects reported not previously addressed with a pharmacist or physician: None reported  Questions for the pharmacist: No    Confirmed patient received a Conservation officer, historic buildings and a Surveyor, mining with first shipment. The patient will receive a drug information handout for each medication shipped and additional FDA Medication Guides as required.       DISEASE/MEDICATION-SPECIFIC INFORMATION        N/A    SPECIALTY MEDICATION ADHERENCE     Medication Adherence    Patient reported X missed doses in the last month: 7  Specialty Medication: Ogsiveo 100mg   Patient is on additional specialty medications: No  Patient is on more than two specialty medications: No  Any gaps in refill history greater than 2 weeks in the last 3 months: no  Demonstrates understanding of importance of adherence: yes  Informant: mother              Were doses missed due to medication being on hold? Yes - med was on hold when hospitalized in early May    Ogsiveo 100 mg: 8 days of medicine on hand     REFERRAL TO PHARMACIST     Referral to the pharmacist: Not needed      [facility]     Shipping address confirmed in Epic.     Cost and Payment: Patient has a $0 copay, payment information is not required.    Delivery Scheduled: Yes, Expected medication delivery date: 08/13/23.     Medication will be delivered via UPS to the prescription address in Epic [place].    Joseph Nickel, PharmD   [facility] Delivery Pharm[facility]  Specialty Pharmacist

## 2023-08-11 DIAGNOSIS — D48119 Desmoid tumor: Principal | ICD-10-CM

## 2023-08-11 MED ORDER — NIROGACESTAT 100 MG TABLET
ORAL_TABLET | Freq: Two times a day (BID) | ORAL | 5 refills | 30.00000 days | Status: CP
Start: 2023-08-11 — End: ?

## 2023-08-14 NOTE — Unmapped (Signed)
 Clinical Assessment Needed For: Dose Change  Medication: OGSIVEO 100 mg tablet (nirogacestat)  Last Fill Date/Day Supply: 07/29/23 / 14 day supply  Copay $0.00  Was previous dose already scheduled to fill: No    Notes to Pharmacist: insurance max 15 days for 3 months

## 2023-08-17 NOTE — Unmapped (Addendum)
 [name] [name] 's Ogsiveo shipment will be sent out as a result of original delivery was cancelled due to receiving new prescription for twice daily dosing.    I have reached out to the patient 's mother at [phone] and communicated the delivery change. We will reschedule the medication for the delivery date that the patient agreed upon.  We have confirmed the delivery date as 08/20/23, via ups.     [name] [name], PharmD  [facility] Delivery [facility]

## 2023-08-19 MED ORDER — BUPRENORPHINE 20 MCG/HOUR WEEKLY TRANSDERMAL PATCH
MEDICATED_PATCH | TRANSDERMAL | 2 refills | 28.00000 days | Status: CP
Start: 2023-08-19 — End: 2023-11-11

## 2023-08-19 MED ORDER — OXYCODONE 10 MG TABLET,ORAL ONLY (NOT FEEDING TUBES)
ORAL_TABLET | Freq: Four times a day (QID) | ORAL | 0 refills | 0.00000 days | Status: CP
Start: 2023-08-19 — End: ?

## 2023-08-19 NOTE — Unmapped (Signed)
 Called patient to schd an appt to schd an appt that was requested via mychart per Dr. Alfreida Anon patient needs to be schd with an adult provider had to LVM.Aaron Aas MJ

## 2023-08-20 ENCOUNTER — Ambulatory Visit
Admit: 2023-08-20 | Discharge: 2023-08-21 | Payer: BLUE CROSS/BLUE SHIELD | Attending: Student in an Organized Health Care Education/Training Program | Primary: Student in an Organized Health Care Education/Training Program

## 2023-08-20 DIAGNOSIS — D48119 Desmoid tumor: Principal | ICD-10-CM

## 2023-08-20 DIAGNOSIS — R42 Dizziness and giddiness: Principal | ICD-10-CM

## 2023-08-20 NOTE — Unmapped (Signed)
 DIVISION OF CARDIOLOGY  [employer] , [place] [place]  Date of Service: 08/20/2023    PCP: Referring Provider:   Job Lukes, PAC  [address] [place] [address]  Phone: [phone]  Fax: [phone] Loris Lang Masters, MD  [address]  [facility] [address] [place],  [place] [address]  Phone: [phone]  Fax: [phone]     Assessment and Plan:     [name] [name] is a 23 y.o. female with desmoid tumor, GIB, cancer related pain, malnutrition who presents today in consultation for syncope.    Syncope  Patient has had recurrent syncopal events preceded by dizziness, lightheadedness, vision fading. Usually caused by stress, pain or standing up quickly. Patient has had recurrent admissions in setting of pouchitis, sepsis. Has had extensive bowel resection. Has had episodic diarrhea and appetite has been poor. She reports not keeping up with her outputs. She also has deconditioning in setting of all this. Overall, patients symptoms of syncope and tachycardia mostly driven by deconditioning, dehydration, malnutrition. Patient does not meet criteria of POTS at this time as her tachycardia is mostly mediated by other factors mentioned above.   - Okay to use abdominal binder, compression stockings  - Encourage PO intake to combat fluid loses  - Pain control   - Close follow up with oncology  - Continue with PT/OT for conditioning    Return if symptoms worsen or fail to improve.    The patient was seen and discussed with Dr. Luevenia.     Subjective:     Reason for consultation: syncope    Brief HPI: [name] [name] is a 23 y.o. female with PMH as below who is seen at the request of Lang Masters Loris.    In 2020 developed COVID and started having syncope and arrhythmias. Cerafnib. Dehydration, COVID, Mono. Then improved.      Summer 2024 admitted for muscle rigidity and lactic acidosis. Developed bacterial and fungal infection.  Then hospitalized for pouchitis 02/2023. Recently hospitalized for syncope April 2025. . Increased syncope when standing up. Increased episodes. Reports passing in out in setting of pain, vasovagal, dehydration. Starts seeing spots, nausea, numbness, ear ringing. Sitting to standing, laying to standing. Hasn't warn abdominal binder. No compression stocking. Eating has been a little better but difficult. PO intake has been poor. Fluid infusions once a month with oncology. Has noticed difference with IVF.     ROS: 10 systems were reviewed and negative except as noted in HPI.    Cardiovascular risk factors:  None    Cardiovascular diagnoses:  None    Cardiovascular History & Procedures:   ECG reviewed 6/12 and normal sinus rhythm with no abnormalities  01/2023 TTE  Summary    1. The left ventricle is normal in size with normal wall thickness.    2. The left ventricular systolic function is normal, LVEF is visually  estimated at > 55%.    3. The right ventricle is normal in size, with normal systolic function.    Past medical history:     has a past medical history of Abdominal pain, Acid reflux, Anesthesia complication, Cancer, Cataract of right eye, COVID-19 virus infection (01/2019), Cyst of thyroid determined by ultrasound, [name], Difficult intravenous access, [name] (familial adenomatous polyposis), [name] (HHS-HCC), Gastric polyps, History of chemotherapy, History of colon polyps, History of COVID-19 (01/2019), Ileus (03/16/2022), Iron deficiency anemia due to chronic blood loss, PONV (postoperative nausea and vomiting), Rectal bleeding, and Syncopal episodes.  has a past surgical history that includes desmoid removal; cystis removal; Tumor removal; pr upper gi endoscopy,biopsy (N/A, 10/27/2012); pr colonoscopy w/biopsy single/multiple (N/A, 10/27/2012); pr upper gi endoscopy,biopsy (N/A, 09/14/2013); pr colonoscopy w/biopsy single/multiple (N/A, 09/14/2013); pr upper gi endoscopy,biopsy (N/A, 11/08/2014); pr colonoscopy w/biopsy single/multiple (N/A, 11/08/2014); pr upper gi endoscopy,biopsy (N/A, 12/26/2015); pr colonoscopy w/biopsy single/multiple (N/A, 12/26/2015); pr upper gi endoscopy,biopsy (N/A, 08/27/2016); pr colsc flx w/removal lesion by hot bx forceps (N/A, 08/27/2016); pr exc skin benig >4 cm face,facial (Right, 02/25/2017); pr exc skin benig 3.1-4 cm trunk,arm,leg (Right, 02/25/2017); pr exc skin benig 2.1-3 cm trunk,arm,leg (Right, 02/25/2017); pr upper gi endoscopy,biopsy (N/A, 09/02/2017); pr colonoscopy w/biopsy single/multiple (N/A, 09/02/2017); pr colsc flx w/rmvl of tumor polyp lesion snare tq (N/A, 02/25/2019); pr exc tumor soft tissue leg/ankle subq 3+cm (Right, 08/05/2019); pr exc tumor soft tissue leg/ankle subq <3cm (Right, 08/05/2019); pr colsc flx w/rmvl of tumor polyp lesion snare tq (N/A, 03/13/2020); pr upper gi endoscopy,biopsy (N/A, 03/13/2020); cyroablation; pr lap, surg proctectomy w j-pouch (N/A, 08/10/2020); pr close enterostomy,resec+anast (N/A, 10/09/2020); pr ndsc eval intstinal pouch dx w/collj spec spx (N/A, 01/23/2021); pr unlisted procedure small intestine (01/23/2021); pr ndsc eval intstinal pouch dx w/collj spec spx (N/A, 08/27/2021); pr upper gi endoscopy,biopsy (N/A, 09/05/2021); pr ndsc eval intstinal pouch dx w/collj spec spx (N/A, 12/09/2021); pr upper gi endoscopy,diagnosis (N/A, 01/20/2022); pr ndsc eval intstinal pouch w/bx single/multiple (N/A, 01/20/2022); pr ndsc eval intstinal pouch w/bx single/multiple (N/A, 02/13/2022); pr unlisted procedure small intestine (02/13/2022); pr ndsc eval intstinal pouch dx w/collj spec spx (Left, 04/09/2022); Colon surgery; pr ndsc eval intstinal pouch dx w/collj spec spx (N/A, 08/05/2022); pr ndsc eval intstinal pouch dx w/collj spec spx (N/A, 03/13/2023); pr ndsc eval intstinal pouch w/bx single/multiple (N/A, 03/13/2023); pr ndsc eval intstinal pouch dx w/collj spec spx (N/A, 07/06/2023); pr endoscopy upper small intestine (N/A, 07/15/2023); and IR Insert Port Age Greater Than 5 Years (07/20/2023).    Medications:   Reviewed and updated in Epic.    Allergies:  Reviewed and updated in Epic.    Social history:   reports that she has never smoked. She has been exposed to tobacco smoke. She has never used smokeless tobacco. She reports that she does not drink alcohol and does not use drugs.    Family history:  family history includes Alcohol abuse in her paternal grandfather; Arthritis in her maternal grandfather; Asthma in her maternal grandfather; COPD in her paternal grandmother; Cancer in her maternal grandmother; Diabetes in her maternal grandmother; Hypertension in her maternal grandmother; Miscarriages / Stillbirths in her paternal grandmother; No Known Problems in her brother, father, maternal aunt, maternal uncle, mother, paternal aunt, paternal uncle, and sister; Other in her maternal grandmother; Stroke in her maternal grandmother; Thyroid disease in her maternal grandmother.    Objective:      BP 114/57 (BP Site: L Arm, BP Position: Sitting, BP Cuff Size: Small)  - Pulse 91  - Ht 165.1 cm (5' 5)  - Wt 54.8 kg (120 lb 12.8 oz)  - SpO2 98%  - BMI 20.10 kg/m??    Wt Readings from Last 3 Encounters:   08/20/23 54.8 kg (120 lb 12.8 oz)   08/05/23 53.5 kg (118 lb)   07/31/23 53.7 kg (118 lb 4.8 oz)       Physical Exam:  General appearance - alert, well appearing, and in no distress  Heart - normal rate, regular rhythm, normal S1, S2, no murmurs, rubs, clicks or gallops  Extremities - peripheral pulses normal,  no pedal edema, no clubbing or cyanosis    EKG personally reviewed in clinic.    Pertinent labs:  Lab Results   Component Value Date    Creatinine 0.53 (L) 07/19/2023    Creatinine 0.50 07/01/2013    Potassium 3.5 07/19/2023    Potassium 4.2 07/01/2013    Magnesium 2.0 07/19/2023    Magnesium 2.2 07/15/2011    Cholesterol, LDL, Calculated 79 06/06/2022    Cholesterol, HDL 48 06/06/2022    Hemoglobin A1C 4.7 (L) 06/06/2022    TSH 0.513 (L) 03/08/2023    TSH 1.60 06/13/2014    INR 0.99 04/04/2023    INR 1.1 10/31/2011    BNP 21 03/26/2022   .

## 2023-08-20 NOTE — Unmapped (Signed)
 External referral sent to Gastroenterology for desmoid tumor per Vallie Gay.  Sent via email   [email or web address]

## 2023-08-20 NOTE — Unmapped (Addendum)
 [facility] at [facility]  [address]  [place], [address]  Phone: [phone]  Fax: [phone]    Today's Visit:  We reviewed all your workup and talked about your symptoms. We do not believe you have POTS. The cause of your symptoms most likely in setting of deconditioning, dehydration, GI bleeding, and recent hospitalizations for infections. Continue using abdominal binder, compression stocking and keeping up with fluid loses.   Please follow up as needed.    Thank you for choosing [facility] for your care.    Please bring all your medications to each clinic visit.                Thank you for choosing [facility] for your healthcare needs.    We are committed to delivering exceptional care and continuously improving our services. After your visit, you may receive a brief survey by text or email regarding your experience.    We invite you to share your thoughts about the care you received today and you???d like to recognize a team member who made your visit special, your feedback is truly appreciated.    Provider Name: Ewing Holiday del Moral, MD   Care Partner Name: Merna Aase, RN    When the patient survey arrives, we hope you???ll take a few moments to complete it. Your insights help us  grow and continue to maintain a five star experience for every patient.                      Thank you for choosing [facility] for your healthcare needs.    We are committed to delivering exceptional care and continuously improving our services. After your visit, you may receive a brief survey by text or email regarding your experience.    We invite you to share your thoughts about the care you received today and you???d like to recognize a team member who made your visit special, your feedback is truly appreciated.    Provider Name: Ewing Holiday del Moral, MD   Care Partner Name: Merna Aase, RN    When the patient survey arrives, we hope you???ll take a few moments to complete it. Your insights help us  grow and continue to maintain a five star experience for every patient.

## 2023-08-23 DIAGNOSIS — D48119 Desmoid tumor: Principal | ICD-10-CM

## 2023-08-24 DIAGNOSIS — D48119 Desmoid tumor: Principal | ICD-10-CM

## 2023-08-24 NOTE — Unmapped (Signed)
 [facility] and Home Delivery Pharmacy Refill Coordination Note    Specialty Medication(s) to be Shipped:   Hematology/Oncology: Ogsiveo 100mg     Other medication(s) to be shipped: No additional medications requested for fill at this time     [name] [name], DOB: 21-Aug-2000  Phone: [phone] (home) [phone] (work)      All above HIPAA information was verified with patient's family member, Mom.     Was a Nurse, learning disability used for this call? No    Completed refill call assessment today to schedule patient's medication shipment from the [facility] and [facility] Delivery Pharm[facility]  [phone]).  All relevant notes have been reviewed.     Specialty medication(s) and dose(s) confirmed: Regimen is correct and unchanged.   Changes to medications: [name] reports no changes at this time.  Changes to insurance: No  New side effects reported not previously addressed with a pharmacist or physician: Yes - Patient reports burning sensation in esophagus. Patient would not like to speak to the pharmacist today. Their provider is not aware.  Questions for the pharmacist: No    Confirmed patient received a Conservation officer, historic buildings and a Surveyor, mining with first shipment. The patient will receive a drug information handout for each medication shipped and additional FDA Medication Guides as required.       DISEASE/MEDICATION-SPECIFIC INFORMATION        N/A    SPECIALTY MEDICATION ADHERENCE     Medication Adherence    Patient reported X missed doses in the last month: 0  Specialty Medication: Ogsiveo 100mg   Patient is on additional specialty medications: No  Informant: mother     Were doses missed due to medication being on hold? No    Ogsiveo 100 mg: 16 days of medicine on hand       REFERRAL TO PHARMACIST     Referral to the pharmacist: Not needed      [facility]     Shipping address confirmed in Epic.     Cost and Payment: Patient has a $0 copay, payment information is not required.    Delivery Scheduled: Yes, Expected medication delivery date: 09/04/23.     Medication will be delivered via UPS to the prescription address in Epic [place].    [name] [name]   [facility]  Specialty Technician

## 2023-08-26 NOTE — Unmapped (Signed)
 I reviewed the chart, the available primary data including images, and I reviewed the fellows note. I discussed the interval history and plan with the fellow. I agree with the fellow???s assessment and plan. Jeannie Done, MD

## 2023-08-28 ENCOUNTER — Encounter: Admit: 2023-08-28 | Discharge: 2023-08-29 | Payer: BLUE CROSS/BLUE SHIELD

## 2023-08-28 ENCOUNTER — Inpatient Hospital Stay: Admit: 2023-08-28 | Discharge: 2023-08-29 | Payer: BLUE CROSS/BLUE SHIELD

## 2023-08-28 ENCOUNTER — Ambulatory Visit: Admit: 2023-08-28 | Discharge: 2023-08-29 | Payer: BLUE CROSS/BLUE SHIELD

## 2023-08-28 DIAGNOSIS — D48119 Desmoid tumor: Principal | ICD-10-CM

## 2023-08-28 DIAGNOSIS — D500 Iron deficiency anemia secondary to blood loss (chronic): Principal | ICD-10-CM

## 2023-08-28 DIAGNOSIS — E860 Dehydration: Principal | ICD-10-CM

## 2023-08-28 LAB — COMPREHENSIVE METABOLIC PANEL
ALBUMIN: 3.1 g/dL — ABNORMAL LOW (ref 3.4–5.0)
ALKALINE PHOSPHATASE: 54 U/L (ref 46–116)
ALT (SGPT): 12 U/L (ref 10–49)
ANION GAP: 11 mmol/L (ref 5–14)
AST (SGOT): 15 U/L (ref <=34)
BILIRUBIN TOTAL: 0.2 mg/dL — ABNORMAL LOW (ref 0.3–1.2)
BLOOD UREA NITROGEN: 7 mg/dL — ABNORMAL LOW (ref 9–23)
BUN / CREAT RATIO: 17
CALCIUM: 8.6 mg/dL — ABNORMAL LOW (ref 8.7–10.4)
CHLORIDE: 106 mmol/L (ref 98–107)
CO2: 28.3 mmol/L (ref 20.0–31.0)
CREATININE: 0.41 mg/dL — ABNORMAL LOW (ref 0.55–1.02)
EGFR CKD-EPI (2021) FEMALE: >90 mL/min/{1.73_m2} (ref >=60)
GLUCOSE RANDOM: 93 mg/dL (ref 70–179)
POTASSIUM: 3.6 mmol/L (ref 3.4–4.8)
PROTEIN TOTAL: 5.7 g/dL (ref 5.7–8.2)
SODIUM: 145 mmol/L (ref 135–145)

## 2023-08-28 LAB — CBC W/ AUTO DIFF
BASOPHILS ABSOLUTE COUNT: 0 10*9/L (ref 0.0–0.1)
BASOPHILS RELATIVE PERCENT: 0.6 %
EOSINOPHILS ABSOLUTE COUNT: 0.1 10*9/L (ref 0.0–0.5)
EOSINOPHILS RELATIVE PERCENT: 1.7 %
HEMATOCRIT: 30.9 % — ABNORMAL LOW (ref 34.0–44.0)
HEMOGLOBIN: 10.4 g/dL — ABNORMAL LOW (ref 11.3–14.9)
LYMPHOCYTES ABSOLUTE COUNT: 1.9 10*9/L (ref 1.1–3.6)
LYMPHOCYTES RELATIVE PERCENT: 33.9 %
MEAN CORPUSCULAR HEMOGLOBIN CONC: 33.6 g/dL (ref 32.0–36.0)
MEAN CORPUSCULAR HEMOGLOBIN: 28.2 pg (ref 25.9–32.4)
MEAN CORPUSCULAR VOLUME: 84.1 fL (ref 77.6–95.7)
MEAN PLATELET VOLUME: 9 fL (ref 6.8–10.7)
MONOCYTES ABSOLUTE COUNT: 0.7 10*9/L (ref 0.3–0.8)
MONOCYTES RELATIVE PERCENT: 12.1 %
NEUTROPHILS ABSOLUTE COUNT: 3 10*9/L (ref 1.8–7.8)
NEUTROPHILS RELATIVE PERCENT: 51.7 %
NUCLEATED RED BLOOD CELLS: 0 /100{WBCs} (ref <=4)
PLATELET COUNT: 258 10*9/L (ref 150–450)
RED BLOOD CELL COUNT: 3.68 10*12/L — ABNORMAL LOW (ref 3.95–5.13)
RED CELL DISTRIBUTION WIDTH: 13.3 % (ref 12.2–15.2)
WBC ADJUSTED: 5.7 10*9/L (ref 3.6–11.2)

## 2023-08-28 MED ADMIN — heparin, porcine (PF) 100 unit/mL injection 500 Units: 500 [IU] | INTRAVENOUS | @ 14:00:00 | Stop: 2023-08-28

## 2023-08-28 MED ADMIN — sodium chloride (NS) 0.9 % infusion: 1000 mL/h | INTRAVENOUS | @ 13:00:00 | Stop: 2023-08-28

## 2023-08-28 MED ADMIN — promethazine (PHENERGAN) 12.5 mg in sodium chloride 0.9 % 50 mL IVPB (premix): 12.5 mg | INTRAVENOUS | @ 15:00:00 | Stop: 2023-08-28

## 2023-08-28 NOTE — Unmapped (Signed)
 Patient arrived to infusion services, weight and vital signs obtained; patient arrived with port accessed, here for IVF; reports inc nausea and pain, Melissa NP at chairside to speak with patient; orthostatic BP obtained, labs sent, Phenergan  IV ordered and NS bolus initiated  Phenergan  IV given as ordered, port heparinized and de-accessed s/p, band-aid applied  Patient instructed to stop by check out and then go to registration for ultrasound that was ordered, discharge to self-care

## 2023-08-28 NOTE — Unmapped (Signed)
 Infusion on 08/28/2023   Component Date Value Ref Range Status    Sodium 08/28/2023 145  135 - 145 mmol/L Final    Potassium 08/28/2023 3.6  3.4 - 4.8 mmol/L Final    Chloride 08/28/2023 106  98 - 107 mmol/L Final    CO2 08/28/2023 28.3  20.0 - 31.0 mmol/L Final    Anion Gap 08/28/2023 11  5 - 14 mmol/L Final    BUN 08/28/2023 7 (L)  9 - 23 mg/dL Final    Creatinine 93/79/7974 0.41 (L)  0.55 - 1.02 mg/dL Final    BUN/Creatinine Ratio 08/28/2023 17   Final    eGFR CKD-EPI (2021) Female 08/28/2023 >90  >=60 mL/min/1.53m2 Final    eGFR calculated with CKD-EPI 2021 equation in accordance with [employer] and [employer] of Nephrology Task Force recommendations.    Glucose 08/28/2023 93  70 - 179 mg/dL Final    Calcium 93/79/7974 8.6 (L)  8.7 - 10.4 mg/dL Final    Albumin 93/79/7974 3.1 (L)  3.4 - 5.0 g/dL Final    Total Protein 08/28/2023 5.7  5.7 - 8.2 g/dL Final    Total Bilirubin 08/28/2023 0.2 (L)  0.3 - 1.2 mg/dL Final    AST 93/79/7974 15  <=34 U/L Final    ALT 08/28/2023 12  10 - 49 U/L Final    Alkaline Phosphatase 08/28/2023 54  46 - 116 U/L Final    WBC 08/28/2023 5.7  3.6 - 11.2 10*9/L Final    RBC 08/28/2023 3.68 (L)  3.95 - 5.13 10*12/L Final    HGB 08/28/2023 10.4 (L)  11.3 - 14.9 g/dL Final    HCT 93/79/7974 30.9 (L)  34.0 - 44.0 % Final    MCV 08/28/2023 84.1  77.6 - 95.7 fL Final    MCH 08/28/2023 28.2  25.9 - 32.4 pg Final    MCHC 08/28/2023 33.6  32.0 - 36.0 g/dL Final    RDW 93/79/7974 13.3  12.2 - 15.2 % Final    MPV 08/28/2023 9.0  6.8 - 10.7 fL Final    Platelet 08/28/2023 258  150 - 450 10*9/L Final    nRBC 08/28/2023 0  <=4 /100 WBCs Final    Neutrophils % 08/28/2023 51.7  % Final    Lymphocytes % 08/28/2023 33.9  % Final    Monocytes % 08/28/2023 12.1  % Final    Eosinophils % 08/28/2023 1.7  % Final    Basophils % 08/28/2023 0.6  % Final    Absolute Neutrophils 08/28/2023 3.0  1.8 - 7.8 10*9/L Final    Absolute Lymphocytes 08/28/2023 1.9  1.1 - 3.6 10*9/L Final    Absolute Monocytes 08/28/2023 0.7  0.3 - 0.8 10*9/L Final    Absolute Eosinophils 08/28/2023 0.1  0.0 - 0.5 10*9/L Final    Absolute Basophils 08/28/2023 0.0  0.0 - 0.1 10*9/L Final

## 2023-08-28 NOTE — Unmapped (Signed)
 Infusion Center Progress Note    Patient Name: [name]  Patient Age: 23 y.o.  Encounter Date: 08/28/2023    Reason for visit  Chief Complaint: New onset right sided abdominal pain      Assessment/Plan:  I have discussed the case (exam, laboratory results, and plan) with patient and team are all in agreement with plan outlined below. Patient here today for 1L IVF for decreased PO intake over the last few days. She is having some increased new right sided abdominal pain that she states is causing her to double over in pain. Has had some low grade temps which is not unusual for her when she is in pain. She relates to the pain being sharp and stabbing which is different. Upon palpation her abdomen is tender, otherwise she isn't feeling the pain at this time. Pain started 3 days ago and kept her up most of the night. PO intake decreasing. Worried she will end up admitted if pain continues. Some nausea present which is constant. Asked if we could do order of IV phen to get ahead before the weekend. Will order labs to r/o infection vs acute illness. Labs are reassuring. Will obtain US  of abdomen. Spoke with Dr Loris who is agreeable to plan.     Plan:  CBC  CMP  1L IVF  IVPB phenergan  12.5  Orthostatic VS  Abdominal US     Subjective/HPI:  [name] is a 22 y.o. 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 She has had numerous sites of disease, including paraspinal, chest wall, right arm/wrist. She has had several cryoablations with Dr. Babara which have seemed to offer clinical benefit. She trialed sorafenib  which seemed to lead to shrinkage of lesions, but she had significant intolerance with dizziness, presyncope, fatigue, hair loss, rash, GI side effects. Since starting Nicrogacestat she has had increased diarrhea upwards to 15-20 episodes of diarrhea. Mother states they went from 100mg  twice a day to 100mg  once a day with some relief. They are hesitant about taking imodium  which is understood with her history. Will continue to take once a day until she sees Dr Loris back in clinic next week. Currently trying to keep her out of hospital for pain and nausea at this time.     4/26-5/11: Admitted with abdominal pain, hematochezia. She underwent iron  infusion and EGD, discharged on budesonide . Syncope, POTS was ruled out.     Review of Systems:  Review of Systems   Constitutional:  Positive for appetite change.   Gastrointestinal:  Positive for abdominal pain and diarrhea.   Neurological:  Positive for dizziness and light-headedness.       Oncology History:  Hematology/Oncology History    No history exists.       Allergies:  Allergies[1]    Medications:  Reviewed in the EMR    Physical Exam:  There were no vitals taken for this visit.    Physical Exam  HENT:      Head: Normocephalic.     Cardiovascular:      Rate and Rhythm: Normal rate.   Pulmonary:      Effort: Pulmonary effort is normal.   Abdominal:      Tenderness: There is abdominal tenderness.      Comments: Right side     Skin:     Coloration: Skin is pale.     Neurological:      General: No focal deficit present.      Mental Status: She is  alert and oriented to person, place, and time.       Results for orders placed or performed in visit on 08/28/23   Comprehensive Metabolic Panel   Result Value Ref Range    Sodium 145 135 - 145 mmol/L    Potassium 3.6 3.4 - 4.8 mmol/L    Chloride 106 98 - 107 mmol/L    CO2 28.3 20.0 - 31.0 mmol/L    Anion Gap 11 5 - 14 mmol/L    BUN 7 (L) 9 - 23 mg/dL    Creatinine 9.58 (L) 0.55 - 1.02 mg/dL    BUN/Creatinine Ratio 17     eGFR CKD-EPI (2021) Female >90 >=60 mL/min/1.8m2    Glucose 93 70 - 179 mg/dL    Calcium 8.6 (L) 8.7 - 10.4 mg/dL    Albumin 3.1 (L) 3.4 - 5.0 g/dL    Total Protein 5.7 5.7 - 8.2 g/dL    Total Bilirubin 0.2 (L) 0.3 - 1.2 mg/dL    AST 15 <=65 U/L    ALT 12 10 - 49 U/L    Alkaline Phosphatase 54 46 - 116 U/L   CBC w/ Differential   Result Value Ref Range    WBC 5.7 3.6 - 11.2 10*9/L RBC 3.68 (L) 3.95 - 5.13 10*12/L    HGB 10.4 (L) 11.3 - 14.9 g/dL    HCT 69.0 (L) 65.9 - 44.0 %    MCV 84.1 77.6 - 95.7 fL    MCH 28.2 25.9 - 32.4 pg    MCHC 33.6 32.0 - 36.0 g/dL    RDW 86.6 87.7 - 84.7 %    MPV 9.0 6.8 - 10.7 fL    Platelet 258 150 - 450 10*9/L    nRBC 0 <=4 /100 WBCs    Neutrophils % 51.7 %    Lymphocytes % 33.9 %    Monocytes % 12.1 %    Eosinophils % 1.7 %    Basophils % 0.6 %    Absolute Neutrophils 3.0 1.8 - 7.8 10*9/L    Absolute Lymphocytes 1.9 1.1 - 3.6 10*9/L    Absolute Monocytes 0.7 0.3 - 0.8 10*9/L    Absolute Eosinophils 0.1 0.0 - 0.5 10*9/L    Absolute Basophils 0.0 0.0 - 0.1 10*9/L          I spent a total of 45 minutes in the care of this patient.     Jesaiah Fabiano O'Bryant, AGNP-BC  [facility]         [1]   Allergies  Allergen Reactions    Adhesive Tape-Silicones Hives and Rash     Paper tape and Tegederm ok. Biopatch causes blistering but has tolerated CHG Tegaderm    Ferrlecit  [Sodium Ferric Gluconat-Sucrose] Swelling and Rash    Levofloxacin  Swelling and Rash     Swelling in mouth, rash,     Methylnaltrexone       Per Patient: I lost bowel control, severe abdominal cramping, and elevated BP    Neomycin Swelling     Rxn after ear drops; ear swelling    Papaya Hives    Morphine Nausea And Vomiting    Zosyn [Piperacillin-Tazobactam] Itching and Rash     Red and itchy    Compazine [Prochlorperazine] Other (See Comments)     Extreme agitation    Iron  Analogues     Reglan  [Metoclopramide  Hcl] Diarrhea    Iron  Dextran Itching     Received iron  dextran 06/08/22 over 12 hours, had itching and redness/flushing during  the infusion and for a couple days after. Required IV benadryl  w/flares between doses and ultimately treated w/IV methylpred for 2 days.     Latex, Natural Rubber Rash

## 2023-08-29 DIAGNOSIS — D48119 Desmoid tumor: Principal | ICD-10-CM

## 2023-09-01 ENCOUNTER — Ambulatory Visit: Admit: 2023-09-01 | Discharge: 2023-09-01 | Payer: BLUE CROSS/BLUE SHIELD

## 2023-09-01 ENCOUNTER — Encounter: Admit: 2023-09-01 | Discharge: 2023-09-01 | Payer: BLUE CROSS/BLUE SHIELD

## 2023-09-01 ENCOUNTER — Other Ambulatory Visit: Admit: 2023-09-01 | Discharge: 2023-09-01 | Payer: BLUE CROSS/BLUE SHIELD

## 2023-09-01 DIAGNOSIS — D48119 Desmoid tumor: Principal | ICD-10-CM

## 2023-09-01 LAB — CBC W/ AUTO DIFF
BASOPHILS ABSOLUTE COUNT: 0 10*9/L (ref 0.0–0.1)
BASOPHILS RELATIVE PERCENT: 0.6 %
EOSINOPHILS ABSOLUTE COUNT: 0.1 10*9/L (ref 0.0–0.5)
EOSINOPHILS RELATIVE PERCENT: 1.8 %
HEMATOCRIT: 33.4 % — ABNORMAL LOW (ref 34.0–44.0)
HEMOGLOBIN: 11.3 g/dL (ref 11.3–14.9)
LYMPHOCYTES ABSOLUTE COUNT: 2.4 10*9/L (ref 1.1–3.6)
LYMPHOCYTES RELATIVE PERCENT: 31.5 %
MEAN CORPUSCULAR HEMOGLOBIN CONC: 33.9 g/dL (ref 32.0–36.0)
MEAN CORPUSCULAR HEMOGLOBIN: 27.9 pg (ref 25.9–32.4)
MEAN CORPUSCULAR VOLUME: 82.3 fL (ref 77.6–95.7)
MEAN PLATELET VOLUME: 9.1 fL (ref 6.8–10.7)
MONOCYTES ABSOLUTE COUNT: 0.8 10*9/L (ref 0.3–0.8)
MONOCYTES RELATIVE PERCENT: 11 %
NEUTROPHILS ABSOLUTE COUNT: 4.1 10*9/L (ref 1.8–7.8)
NEUTROPHILS RELATIVE PERCENT: 55.1 %
PLATELET COUNT: 292 10*9/L (ref 150–450)
RED BLOOD CELL COUNT: 4.06 10*12/L (ref 3.95–5.13)
RED CELL DISTRIBUTION WIDTH: 13.2 % (ref 12.2–15.2)
WBC ADJUSTED: 7.5 10*9/L (ref 3.6–11.2)

## 2023-09-01 LAB — COMPREHENSIVE METABOLIC PANEL
ALBUMIN: 3.5 g/dL (ref 3.4–5.0)
ALKALINE PHOSPHATASE: 60 U/L (ref 46–116)
ALT (SGPT): 11 U/L (ref 10–49)
ANION GAP: 4 mmol/L — ABNORMAL LOW (ref 5–14)
AST (SGOT): 12 U/L (ref <=34)
BILIRUBIN TOTAL: <0.2 mg/dL — ABNORMAL LOW (ref 0.3–1.2)
BLOOD UREA NITROGEN: 12 mg/dL (ref 9–23)
BUN / CREAT RATIO: 21
CALCIUM: 9.3 mg/dL (ref 8.7–10.4)
CHLORIDE: 107 mmol/L (ref 98–107)
CO2: 32 mmol/L — ABNORMAL HIGH (ref 20.0–31.0)
CREATININE: 0.57 mg/dL (ref 0.55–1.02)
EGFR CKD-EPI (2021) FEMALE: >90 mL/min/{1.73_m2} (ref >=60)
GLUCOSE RANDOM: 88 mg/dL (ref 70–179)
POTASSIUM: 4 mmol/L (ref 3.4–4.8)
PROTEIN TOTAL: 6.5 g/dL (ref 5.7–8.2)
SODIUM: 143 mmol/L (ref 135–145)

## 2023-09-01 MED ORDER — FAMOTIDINE 20 MG TABLET
ORAL_TABLET | Freq: Every evening | ORAL | 3 refills | 30.00000 days | Status: CP
Start: 2023-09-01 — End: ?

## 2023-09-01 MED ORDER — NALOXONE 3 MG/ACTUATION NASAL SPRAY
NASAL | 2 refills | 0.00000 days | Status: CP
Start: 2023-09-01 — End: ?

## 2023-09-01 MED ORDER — PROMETHAZINE 12.5 MG TABLET
ORAL_TABLET | ORAL | 3 refills | 30.00000 days | Status: CP | PRN
Start: 2023-09-01 — End: ?

## 2023-09-01 MED ORDER — ESCITALOPRAM 5 MG TABLET
ORAL_TABLET | Freq: Every day | ORAL | 2 refills | 30.00000 days | Status: CP
Start: 2023-09-01 — End: ?

## 2023-09-01 NOTE — Unmapped (Signed)
 [name] BONE AND SOFT TISSUE ONCOLOGY RETURN VISIT    Encounter Date: 09/01/2023  Patient Name: [name]  Medical Record Number: [id number]    Referring Physician: Freddrick, None Per Patient  [address] [place],  [place] [address]    Primary Care Provider: Job Lukes, [facility]    DIAGNOSIS:  Desmoid fibromatosis in the setting of [name]    ASSESSMENT/PLAN: 23 y.o. 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    Desmoid fibromatosis, history of [name]     She has had numerous sites of disease, including paraspinal, chest wall, right arm/wrist. She has had several cryoablations with Dr. Babara which have seemed to offer clinical benefit. She trialed sorafenib  which seemed to lead to shrinkage of lesions, but she had significant intolerance with dizziness, presyncope, fatigue, hair loss, rash, GI side effects.      We have discussed at length the nature of desmoid fibromatosis tumors, the fact they can wax and wane independent of therapy, are not cancers in a metastatic potential, but can be locally infiltrative and cause substantial morbidity to the site of disease and occasionally mortality. I reviewed that the emerging standard of care is observation based on the fact that 20-30% will experience spontaneous regression in the year following diagnosis. Given her long experience with desmoids, complete regression may be less likely, but we will still approach this cautiously with parallel goals of avoidance of overtreatment (particularly surgical) and palliation of symptoms / preservation of function.     At first visit in 2022, her lesions were in the shoulder, right forearm, chest area, with a possible desmoid in her left thigh. Since then, she has had waxing waning course with significant pain issues, malnutrition requiring intermittent TPN and tube feeds, multiple GI bleeds, significant anxiety, depression, multiple hospitalizations. Had tumor response to sorafenib  but developed GI bleeding and had to stop. IV iron  infusion led to severe anaphylaxis.    09/16/22: Start nirogacestat .     09/25/22: Admitted with uncontrolled pain and muscle spasms after IR guided procedure. Prolonged hospital stay, neurologic evaluation, managed with muscle relaxers (baclofen , valium ). Discharge 9/11 to rehab. Ramped up on nirogacestat  with some side effects, discharged from rehab 10/2.    03/25/23 - 05/07/23: Prolonged, complex admission for pain, malnutrition. Found to have strep mitis blood culture. NG tube placed, TPN not recommended. Discharged to rehab, but then readmitted with C Diff, treated.    06/26/23: Scans with largely stable disease, although growing abdominal wall tumor.    4/26-5/11: Admitted with abdominal pain, hematochezia. She underwent iron  infusion and EGD, discharged on budesonide . Syncope felt most consistent with POTS.    07/31/23: Nirogacestat  up to 100mg  BID. Budesonide  for GI inflammation. Cards for POTS eval.     09/01/23: Recent abdominal pain led to RUQ US  - mild bil dil but no other findings. Bili not elevated. Cards does not think she meets criteria for POTS. Scans in 1 month.  Assessment & Plan  Desmoid fibromatosis  Desmoid fibromatosis primarily in the mesentery, with additional involvement in the extremities and other locations. Managed with nirogacestat . Reports increased abdominal pain, nausea, and diarrhea, potentially exacerbated by stress and medication side effects. Recent imaging showed mild biliary dilatation without significant liver or gallbladder issues. Concerns about skin reactions and potential skin cancer risk with nirogacestat , though risk is low at 1-2%.  - Continue nirogacestat  100 mg BID, monitor for side effects.  - Use sunblock and limit sun exposure to reduce skin  cancer risk.  - Order MRI for detailed GI and abdominal imaging.  - Consider Imodium  for diarrhea, starting three times a week and adjusting as needed.  - Discuss with pharmacist about potential formulation changes in nirogacestat .  - Consider mirtazapine  for sleep and appetite if needed after current stressors are resolved.    Cancer-related pain  Increased abdominal pain, described as sharp and radiating, possibly related to desmoid tumors or stress. Pain management has been challenging, with increased use of pain medication. Recent imaging and labs do not indicate acute liver or gallbladder issues. Stress may contribute to pain exacerbation.  - Order MRI for further evaluation of abdominal pain.  - Continue current pain management regimen, adjusting as needed.  - Consider stress management techniques to help alleviate pain.    Recurrent gastrointestinal bleeding  Increased gastrointestinal bleeding coinciding with the return of menstrual cycle. Hemoglobin has dropped slightly to 11.3. Bleeding may be exacerbated by stress and dietary changes.  - Monitor hemoglobin levels.  - Consider tranexamic acid  if bleeding persists.  - Ensure adequate dietary protein intake.    Iron  deficiency anemia  Iron  deficiency anemia likely related to recurrent gastrointestinal bleeding and dietary intake. Current hemoglobin is 11.3.  - Monitor hemoglobin and iron  levels.    Malnutrition related to colectomy  Malnutrition concerns due to decreased appetite and dietary intake, exacerbated by diarrhea and abdominal pain. Albumin level has improved to 3.5, indicating nutritional improvement.  - Encourage small, frequent meals with high protein content.  - Monitor nutritional status and adjust dietary plan as needed.    Mental Health  -resume lexapro   -connect with Dr. Susi    I personally reviewed the medical records, pathology and laboratory results and viewed the imaging. All questions were answered to the patient's apparent satisfaction and they voiced understanding and agreement with the plan. Pt has my card and contact information and is encouraged to reach out with any further questions.    [name] Sebesta, MD/MPH  Assistant Professor  Bone and Soft Tissue Oncology Program  [facility]  Pager: [phone]  Nurse Navigator: Corean Bracket RN, BSN, OCN      REASON FOR CONSULTATION:   [name] [name] is a 23 y.o. female who is seen in consultation at the request of Pcp, None Per Patient for evaluation of her desmoid fibromatosis    HISTORY OF PRESENT ILLNESS:      [place] school at [name]      Right arm desmoid is the most bothersome - significant arm cramping, deep aching pain, lasts for ~30 min after cramping starts. Has noticed right hand shaking frequently. 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     Left chest lesion used to be quite uncomfortable, caused swelling in her arm and discomfort. Improved after cryoablation.     Right shoulder blade hurts when she leans back against it, fairly painful. Right chest wall lesion has some associated pain, improving since cryo.     First diagnosed at 23 years old - seen in [place] at first, had numerous cysts. First major issues were two paraspinal lesions, referred to [facility] for biopsy and ultimately resection.  Soon diagnosed with [name], seen by medical genetics early on.  Guided by Dr. Mliss Combs, referred out to proceduralists as needed.  2011 - had a growing lesion on her back as well as bilateral neck lesions, resected at [facility], found to be desmoid  02/2012 - surgery for thoracic spine  desmoid. Maybe another flank lesion removed  2017 - wrist lesion treated by [facility] with bupivicaine, kenalog   08/2016 - endoscopy with adenomatous polyp of the colon, non-dysplastic  02/2017 - subcutaneous lesion resection by plastic surgery (postauricular and RUE, RLE) - path revealed epidermal inclusion cysts, osteomas  05/2018: cryotherapy of left anterior chest wall lesion  05/2018: started sulindac  but struggled with adherence due to GI side effects  08/2018: cryo #2 to chest wall lesion  12/10/2018: started sorafenib  due to progression in left paraspinal lesion, ?posterior chest wall lesion  02/2019: discontinued sorafenib  for several reasons: fatigue, dizziness, nausea, hair loss, as well as COVID infection in late November. Derm consulted for rash / hair loss, started on antihistamines, topical steroids. Trialed lower dose but ultimately stopped again 05/2019 due to toxicity.  06/2019: follow up with surgical oncology - plans for observation rather than repeat surgery  07/2019: cryoablation of anterior chest wall desmoid  07/2019: excision of subcutaneous LE mass and L neck masses, clinically appeared consistent with cysts  08/2019: cryoablation of R posterior chest wall, left paraspinal desmoid     Interval History:  History of Present Illness  [name] [name] is a 23 year old female with desmoid fibromatosis who presents for routine follow-up.    She has desmoid fibromatosis primarily in the mesentery, with additional involvement in the extremities and other locations. She is currently on nirogacestat  and experiences increased abdominal pain, described as sharp, shooting, and radiating from her side towards her ribs and into the middle of her abdomen. There is a 'knot' feeling in the abdomen, which she associates with the tumor. She has increased bleeding, episodes of passing out, and significant pain that affects her ability to stand. Recent ultrasound showed a tiny bit of biliary dilatation. Pain worsens with eating, leading to reduced food intake and nutritional deficiencies. Hemoglobin levels have dropped, and she experiences significant nausea. She takes oxycodone , up to four doses a day, with some doses reduced to 15 mg from 20 mg. Despite this, pain remains poorly controlled, affecting her sleep, with only 10-12 hours of sleep over four nights.    She has been experiencing low-grade fevers around 100??F for about a week, often coinciding with periods of intense pain. Her mobility has improved, but due to pain and stress, she uses her wheelchair more frequently. As a Theatre stage manager, she is under significant stress due to academic challenges, including a difficult professor and a demanding exam schedule. This stress is compounded by her medical condition, affecting her ability to study and perform academically.    She has reduced her nirogacestat  dosage due to severe diarrhea, headaches, and general malaise. The diarrhea is particularly severe, occurring up to 10-12 times by midday, leading to dehydration and further nutritional challenges. Her current medications include Lexapro  for hot flashes, Phenergan , and famotidine . The new formulation of nirogacestat  causes a burning sensation in her throat upon ingestion.    She has recently resumed her menstrual cycle after a prolonged absence, experiencing increased GI bleeding and pain. Her hemoglobin is currently 11.3, and her albumin is 3.5. She has been consuming more protein to address nutritional deficiencies. She reports dizziness and near-syncope episodes, particularly since the return of her menstrual cycle.    REVIEW OF SYSTEMS:  A comprehensive review of 12 systems was negative except for pertinent positives noted in HPI.    Past Medical History, Surgical History, Family History were reviewed personally. Any changes were updated as above.    Social History:  Attended [employer] in [name], has been interrupted by health issues  Wants to be a nurse in pediatric hematology/oncology   Mother [name] is a strong advocate, often present at visits    Pregnancy status: premenopausal, interested in future children    ALLERGIES/MEDICATIONS:  Reviewed in Epic    PHYSICAL EXAM:   There were no vitals taken for this visit.  ECOG 1  Gen - well appearing, mother accompanying her   Eyes - conjunctivae clear, PERRL  ENT -no scleral icterus, moist mucous membranes  Lymph - no cervical or supraclavicular lymphadenopathy appreciated  Resp - clear to auscultation bilaterally, no wheezes, crackles or rales  CV - normal rate, regular rhythm, no lower extremity edema noted  Skin - flushing of skin noted on chest  Neuro - AOx3, EOM intact, no facial droop, speech fluent and coherent, moves all extremities without asymmetry  Psych - affect anxious  MSK - no joint tenderness or swelling    PATHOLOGY:   Pathology was personally reviewed as described in the HPI, detailed in Epic.     LABS:  Reviewed in Epic    RADIOLOGY:  Imaging was personally viewed and interpreted as summarized in the history (see above).

## 2023-09-02 DIAGNOSIS — D48119 Desmoid tumor: Principal | ICD-10-CM

## 2023-09-03 MED FILL — OGSIVEO 100 MG TABLET: ORAL | 14 days supply | Qty: 28 | Fill #1

## 2023-09-04 DIAGNOSIS — D48119 Desmoid tumor: Principal | ICD-10-CM

## 2023-09-04 NOTE — Unmapped (Signed)
 Copied from CRM #[id number]. Topic: Care Management - Discuss Care Plan  >> Sep 04, 2023  1:42 PM Nanetta SAILOR wrote:      Erskin Curtistine [facility]  contacted the Communication Center requesting to speak with the care team of Elva Elaine Goldberg to discuss:    Laniesha the patients care plan and she is being evaluated at the their ER and Dr. Ivonne Salt. He would like DR. Loris to please give him a call.     Please contact Curtistine back  at [phone].    Thank you,   Nanetta CHRISTELLA Seeds  [facility]   [phone]

## 2023-09-04 NOTE — Unmapped (Signed)
 Copied from CRM #[id number]. Topic: Care Management - MD/Provider-to-Provider Call  >> Sep 04, 2023  2:38 PM Almeda NOVAK wrote:  Erskin Faden called on behalf of Dr. Ivonne Salt w [facility] requesting to speak with you directly regarding the following:    Discuss mutual patient    Dr. Ivonne Salt can be reached at  [phone]    A page has also been sent.    Thank you,  Almeda GORMAN Avena  [facility] Communication Center  [phone]

## 2023-09-07 DIAGNOSIS — D48119 Desmoid tumor: Principal | ICD-10-CM

## 2023-09-08 MED ORDER — OXYCODONE 10 MG TABLET,ORAL ONLY (NOT FEEDING TUBES)
ORAL_TABLET | ORAL | 0 refills | 0.00000 days
Start: 2023-09-08 — End: ?

## 2023-09-17 MED ORDER — OXYCODONE 10 MG TABLET,ORAL ONLY (NOT FEEDING TUBES)
ORAL_TABLET | Freq: Three times a day (TID) | ORAL | 0 refills | 0.00000 days | Status: CP
Start: 2023-09-17 — End: ?

## 2023-09-22 ENCOUNTER — Inpatient Hospital Stay: Admit: 2023-09-22 | Discharge: 2023-09-22 | Payer: BLUE CROSS/BLUE SHIELD

## 2023-09-22 DIAGNOSIS — D48119 Desmoid tumor: Principal | ICD-10-CM

## 2023-09-22 MED ADMIN — gadopiclenol (ELUCIREM,VUEWAY) injection 5.5 mL: 5.5 mL | INTRAVENOUS | @ 16:00:00 | Stop: 2023-09-22

## 2023-09-22 NOTE — Unmapped (Signed)
 Chronic Pain Follow Up Note    Assessment and Plan  [name] [name] is a 23 y.o. female with past medical history significant for DVT (RUE, s/p Eliquis  x3 months), [name] syndrome s/p colectomy, desmoid tumor, anemia, and severe protein-calorie malnutrition who is being followed at [facility] clinic for abdominal and right shoulder pain that is consistent with the diagnosis of cancer associated pain.    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 Pt has continued and now chronic pain related to her tumors, has R shoulder pain as well though this is better controlled now. Follows with [facility] Dr Loris as well as by [facility] oncology.    Unfortunately, [name] [name] has been experiencing continued severe abdominal pain. The etiology of her pain flare cycles is complex with likely multiple contributing factors.  The most likely explanation appears that she has intermittent pain flares that are caused by an unclear underlying process (potentially due to intermittent intestinal obstructions in the setting of her known mesenteric desmoid tumors, which may act as anchor points for torsion of her bowels) that resulted in an increased opioid requirement and often will lead to hospitalization.  Patient has been admitted several times for extended periods of time over the last year often for either pouchitis versus poor p.o. intake versus intermittent bowel obstruction versus pain flares. Persistent issues with her perception of bleeding through the GI tract and poor p.o. intake.     Visceral hyperalgesia, narcotic bowel syndrome, desmoid tumors of the gastrointestinal tract, cancer related pain, chronic pain syndrome, allodynia, generalized abdominal pain   Chronic, ongoing. Overall, her chronic pain appears to be doing better compared to prior visits. She describes new pain on her right abdomen just below her ribs that wraps around to her back that appears to be more abdominal wall vs visceral pain based on Carnett's sign test being positive, but improved whenever she takes her oxycodone . She admitted to taking her baclofen  only seldomly, and was encouraged to take it 3 times daily to help with this new pain in the abd wall. 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 She currently takes 50-60 mg daily (had at one point been taking up to 100 mg daily), and we discussed continuing that for 2 weeks then weaning down to max 30 mg daily, but she can reach out if unable to tolerate this.  -Continue to follow with Cardiology regarding syncopal episodes   -Continue APAP 1000 mg 3 times daily  -Okay to continue ativan  0.5 mg p.o. prn for anxiety, patient will not take it at the same time as opioids or other sedating medications, takes infrequently  -Refilled oxycodone  10 mg tablets at 20 mg TID prn, max 6 tabs per day for 2 weeks, then wean down to 10 mg TID prn  -Continue Baclofen  5 mg 3 times daily  -Cotninue Butrans  20mg  weekly, refilled  -Continue Lidocaine  ointment  -Still working to wean off of all opioids    Chronic right upper extremity pain  Radial nerve distrubution pain likely due to known desmoid and flare up due to overuse. Negative for carpal tunnel. Describes symptoms consistent with flare of chronic R ulnar neuropathy as well.   - Continue Lidocaine  ointment  - Start Voltaren  gel    Myofascial pain   Chronic, ongoing, at  goal. Not primary concern at this time. Had severe pain flare after recent desmoid injection.  - Home exercise program  - Encouraged SI joint exercises  - Encouraged slow resumption of normal activity and times when pain has decreased  - Apply Voltaren  gel BID to limit GI side effects  - Apply Lidocaine  patches, refilled  - Baclofen  5 mg 3 times daily    Headaches  Improved. Patient previously endorsed worsened headaches at prior visit with discussion of starting Topamax . However, patient stated her headaches started to improve and so she never started it. May consider in future if it becomes a problem again.       Chronic, continuous use of opioids; chronic pain syndrome   Patient is managed on both Butrans  patches for long-acting pain control as a partial mu agonist and oxycodone  for pain flares. Working to wean patient off of oxycodone  and then to work patient down and eventually off of buprenorphine  to improve bowel function while still balancing the need for pain control.  She has successfully weaned to oxycodone  20 mg TID on average. She still has severe abdominal pain, especially when having GI bleeds, so we discussed trying to further wean off opioids; she expressed understanding. Patient is managed on a benzodiazepine for anxiety and knows not to take this around the time of her opioid and is working to wean this off as well.    I have reviewed the [facility] Medical Board statement on use of controlled substances for the treatment of pain as well as the CDC Guideline for Prescribing Opioids for Chronic Pain. I have reviewed the  Controlled Substance Monitoring Database.    Long Term Use of Opioid Medication   - Compliant, using opioid to facilitate functional goals, no concerns for misuse     PDMP reviewed 09/25/2023 Appropriate   Pill count 09/25/2023 Appropriate      Urine toxicology 12/23/22 Appropriate +oxy, +benzo, +buprenorphine    Treatment agreement 02/18/23 Goals: continue with nursing school   Naloxone  rx Date 12/23/22     COMM 08/05/23 7 - higher due to recent hospitalization will update at next visit   Benzodiazepine Yes Ok with minimal use per psychiatry, pt will avoid taking close to opioid doses and only while awake, not near bedtime   Pain Psychology No    QTC 11/01/22 NSR, Qtc 437   Concerns yes OIH, planning to wean off opioids, planning to minimize benzo use   Opioid Risk MODERATE- see comment Given dose and higher COMM, but working to wean and patient is amenable     Opioid Changes and Yearly MME  Date MME Comments   12/23/22 45 + 1.5 mg BID of Suboxone  Post-discharge regimen, increased suboxone  to decrease oxy   01/21/23 45 + 1 mg TID Suboxone  + additional 10 mg oxy per day x 10 days Split Suboxone  to TID dosing for pain, gave additional #10 oxy 10 mg for additional 10 mg oxy per day for 10 days after wisdom tooth extractions   02/18/23 75 + 20 mcg/h Butrans  Increased to oxy 10 mg x4-5 per day for 2-3 weeks given patient's current pain flare in setting of dramatically increased BM frequency, switched to Butrans  given pt's anxiety surrounding Suboxone  and desire for more consistent analgesia with Butrans  that will also minimize pill burden; plan to wean off of oxy hereafter, Butrans  is a technical net decrease from Suboxone    05/07/23 180 + 20 mcg/h Butrans  Discharged on 20 mg liquid oxycodone  q4h PRN + Butrans  20  mcg/h given severe pain that has persisted as an outpatient and thus requirement remains, she is willing to work towards weaning   08/05/23 90-150 + 20 mch/hr Butrans  Higher than historical average given recent pain flare and admission but lower than last visit, working to wean   09/22/23 90 + 20 mcg/h Planning to wean down to 45 + butrans  45 by next visit     Return for return in 1 month.    PLAN:  Today I have prescribed:  Requested Prescriptions     Signed Prescriptions Disp Refills    naloxone  (NARCAN /RIVIVE ) 3 mg nasal spray 2 each 2     Sig: One spray in either nostril once for known/suspected opioid overdose. May repeat every 2-3 minutes in alternating nostril til EMS arrives    oxyCODONE  10 mg TbOr 132 tablet 0     Sig: Take 10-20 mg by mouth three (3) times a day. Taper as discussed in clinic; DNF: 09/23/23     No orders of the defined types were placed in this encounter.     HPI  [name] [name] is a 23 y.o. being followed at [facility] for complaint of chronic pain localized to her shoulders and abdomen.      Patient was last seen in May, at which point Oxycodone  was decreased to 60 mg daily max. The patient reported overall doing better but still having pain flares that fluctuates daily, and describes a new pain that started on 6/18 of her right abdomen just under her rib cage she describes the pain as pressure, pulling, stabbing, and tender with some swelling.  He states the pain wraps around her side and towards her back.  She went to hospital in [place] on 6/20 and had right upper quadrant ultrasound done which was negative.  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  Exam at that time was unremarkable, however she received IV fluids and IV Dilaudid  and antinausea meds with improvement for several days.  Patient reports her pain is now worsened since that time, however also acknowledges that she been under stress with school and recent exams that have contributed to her anxiety.  Patient also reports that she has been having increased bowel movements starting 4 days ago with up to 15 times in 1 day.  This has contributed to worsening abdominal pain and 3 episodes of syncope over the past 2 days.  Patient describes her pain is worse with eating and lying down, and improved with heat and pain meds.  Patient had MRI abdomen and pelvis done yesterday which was largely unchanged from prior imaging and multiple stable cysts. She endorsed benefit from oxycodone  and butrans  patches without any associated adverse side effects. Pain is 3/10 at best and 10/10 at worst, and on average 7/10 over the past month.     Current analgesic regimen:  -Butrans  patch 20 mcg/h  -Oxycodone  20 mg TID  -Ativan  PRN, now approximately 2 times per week  -Tylenol  1000 mg 3 times daily PRN  -Voltaren  1% gel  -Lidocaine  ointment  -Lidocaine  patch  -Baclofen  5 mg 3 times daily      Previous Medication Trials:  NSAIDS: Toradol  (c/f GIB after)  Antidepressant/anxiety: Cymbalta  (stopped due to mood side effects), amitriptyline  (insomnia and increased J-pouch output)  Anticonvulsants: Gabapentin , Lyrica  (both cause lower extremity edema)  Muscle relaxants:  Robaxin , baclofen  (most effective of the conventional therapies), p.o. Valium  (attempting to limit due to opioid dependence)  Topicals: lidocaine , voltaren  gel  Short-acting opiates: oxycodone , PO HM  Long-acting opiates: Buprenorphine   Other: Tylenol     Previous Interventions  Procedures:   - TPIs in August 2023 with initial pain flare, subsequent improvement, and near immediate relief flare of pain; hyperalgesic with procedure  - TPIs of Right trapezius, rhomboid, infraspinatus on 12/04/2021.  - TPIs on 10/25  - Celiac plexus block on 11/1 (severe post-procedural pain flare requiring admission for pain control, significant hyperalgesia)  - TPIs 11/22  - Last TPI>1 YR AGO    Infusions:  Lidocaine  infusions helped during inpatient exacerbations of pain more than ketamine  infusions    PT: Yes    Allergies  Allergies   Allergen Reactions    Adhesive Tape-Silicones Hives and Rash     Paper tape and Tegederm ok. Biopatch causes blistering but has tolerated CHG Tegaderm    Ferrlecit  [Sodium Ferric Gluconat-Sucrose] Swelling and Rash    Levofloxacin  Swelling and Rash     Swelling in mouth, rash,     Methylnaltrexone       Per Patient: I lost bowel control, severe abdominal cramping, and elevated BP    Neomycin Swelling     Rxn after ear drops; ear swelling    Papaya Hives    Morphine Nausea And Vomiting    Zosyn [Piperacillin-Tazobactam] Itching and Rash     Red and itchy    Adhesive      Other Reaction(s): Not available    Compazine [Prochlorperazine] Other (See Comments)     Extreme agitation    Iron  Analogues     Metoclopramide  Diarrhea     Other Reaction(s): Not available    Reglan  [Metoclopramide  Hcl] Diarrhea    Iron  Dextran Itching     Received iron  dextran 06/08/22 over 12 hours, had itching and redness/flushing during the infusion and for a couple days after. Required IV benadryl  w/flares between doses and ultimately treated w/IV methylpred for 2 days.     Latex, Natural Rubber Rash       Home Medications    Current Outpatient Medications   Medication Sig Dispense Refill    acetaminophen  (TYLENOL  EXTRA STRENGTH) 500 MG tablet Take 2 tablets (1,000 mg total) by mouth every eight (8) hours.      baclofen  (LIORESAL ) 10 MG tablet Take 0.5 tablets (5 mg total) by mouth Three (3) times a day. (Patient taking differently: Take 0.5 tablets (5 mg total) by mouth Three (3) times a day. As needed) 45 tablet 2    buprenorphine  (BUTRANS ) 20 mcg/hour PTWK transdermal patch Place 1 patch on the skin once a week. 4 patch 2    butalbital -acetaminophen -caffeine  (ESGIC ) 50-325-40 mg per tablet Take 1 tablet by mouth two (2) times a day as needed for headache. 20 tablet 0    cetirizine  (ZYRTEC ) 10 MG tablet Take 1 tablet (10 mg total) by mouth two (2) times a day. After iron  infusions      escitalopram  oxalate (LEXAPRO ) 5 MG tablet Take 1 tablet (5 mg total) by mouth daily. 30 tablet 2    famotidine  (PEPCID ) 20 MG tablet Take 1 tablet (20 mg total) by mouth nightly. 30 tablet 3    fluticasone  propionate (FLONASE ) 50 mcg/actuation nasal spray Use 1 spray under the butrans  patch once every 7 days to prevent allergic reaction to the adhesive      lidocaine  (ASPERCREME) 4 % patch Place 1  patch on the skin daily. Apply for 12 hours then remove for 12 hours. 30 patch 0    lidocaine  (XYLOCAINE ) 2 % Soln Mix with Children's Benadryl  and Maalox. Swish and spit every 4 hours as needed for mouth pain 100 mL 1    lidocaine  (XYLOCAINE ) 5 % ointment Apply to affected area daily 37 g 3    LORazepam  (ATIVAN ) 0.5 MG tablet Take 1 tablet (0.5 mg total) by mouth two (2) times a day as needed for anxiety. 10 tablet 0    magic mouthwash Take 10 mL by mouth four (4) times a day. 120 mL 0    naloxegol  (MOVANTIK ) 12.5 mg tablet Take 1 tablet (12.5 mg total) by mouth daily as needed (constipation).      nirogacestat  (OGSIVEO ) 100 mg tablet Take 1 tablet (100 mg total) by mouth two (2) times a day. Swallow tablets whole; do not break, crush, or chew. 60 tablet 5    ondansetron  (ZOFRAN -ODT) 8 MG disintegrating tablet Dissolve 1 tablet (8 mg total) in the mouth every eight (8) hours as needed for nausea. 30 tablet 2    pantoprazole  (PROTONIX ) 40 MG tablet Take 1 tablet (40 mg total) by mouth Two (2) times a day (30 minutes before a meal). 60 tablet 0    promethazine  (PHENERGAN ) 12.5 MG tablet Take 1 tablet (12.5 mg total) by mouth as needed during procedure for nausea. 30 tablet 3    scopolamine  (TRANSDERM-SCOP) 1 mg over 3 days Place 1 patch (1 mg total) on the skin every third day. 10 patch 0    sucralfate  (CARAFATE ) 100 mg/mL suspension Take 10 mL (1 g total) by mouth four (4) times a day.      topiramate  (TOPAMAX ) 25 MG capsule Take 1 capsule (25 mg total) by mouth two (2) times a day. 60 capsule 2    naloxone  (NARCAN /RIVIVE ) 3 mg nasal spray One spray in either nostril once for known/suspected opioid overdose. May repeat every 2-3 minutes in alternating nostril til EMS arrives 2 each 2    oxyCODONE  10 mg TbOr Take 10-20 mg by mouth three (3) times a day. Taper as discussed in clinic; DNF: 09/23/23 132 tablet 0     No current facility-administered medications for this visit.     Review Of Systems  General weight loss, weight gain, difficulty sleeping  Cardiovascular fainting, shortness of breath with exertion  Gastrointestinal bloody stool/black stool, nausea, and vomiting  Skin none  Endocrine none  Musculoskeletal none  Neurologic fainting, migraines/headaches  Psychiatric depression, anxiety    Physical Exam    VITALS:   Virtual visit     Wt Readings from Last 3 Encounters:   09/23/23 53.5 kg (118 lb)   09/01/23 55.3 kg (122 lb)   08/20/23 54.8 kg (120 lb 12.8 oz)     GENERAL:  The patient is well developed, well-nourished and appears to be in no apparent distress. The patient is pleasant and interactive. Patient is a good historian.  HEENT:    Reveals normocephalic/atraumatic. Clear sclera. Mucous membranes are moist.  RESPIRATORY:   Normal work of breathing.  No supplemental oxygen .  NEUROLOGIC:    The patient was alert and oriented, speech fluent, normal language.   MUSCULAR:  Positive RUQ Carnett's sign.  Ambulating with cane only now  SKIN:   No obvious rashes, lesions, or erythema.  PSYCHIATRIC:  Appropriate mood and affect.  No pressured speech.    I saw and evaluated the patient, participating in the  key portions of the service.  I reviewed the resident's note and agree with the findings and plan.      I spent 55 mins in direct and indirect care for this patient    Vinie Campbell Delores Mickey, MD  Assistant Professor of Anesthesiology  Department of Anesthesiology   Divisions of General Anesthesiology and Pain Medicine

## 2023-09-22 NOTE — Unmapped (Signed)
 Copied from CRM #[id number]. Topic: Return Scheduling - Return Request  >> Sep 22, 2023 11:09 AM Nanetta SAILOR wrote:      Hello,    We received a call regarding patient [name] [name]. Caller is requesting the following return appointments: infusion that is scheduled for 7/29 be moved to 7/22.    We are unable to schedule this appointment because an infusion is involved and this has to be rescheduled by the infusion dept. Please reach out to Katharine to provide further assistance [phone] and if the appt cannot be scheduled for the 22nd of July can the infusion appt be scheduled for the 21st of July anytime after 1:30 on that date.     Thank you,   Nanetta CHRISTELLA Seeds  [facility]  [phone]

## 2023-09-23 ENCOUNTER — Ambulatory Visit
Admit: 2023-09-23 | Discharge: 2023-09-24 | Payer: BLUE CROSS/BLUE SHIELD | Attending: Student in an Organized Health Care Education/Training Program | Primary: Student in an Organized Health Care Education/Training Program

## 2023-09-23 DIAGNOSIS — G893 Neoplasm related pain (acute) (chronic): Principal | ICD-10-CM

## 2023-09-23 DIAGNOSIS — R1084 Generalized abdominal pain: Principal | ICD-10-CM

## 2023-09-23 DIAGNOSIS — G894 Chronic pain syndrome: Principal | ICD-10-CM

## 2023-09-23 MED ORDER — OXYCODONE 10 MG TABLET,ORAL ONLY (NOT FEEDING TUBES)
ORAL_TABLET | Freq: Three times a day (TID) | ORAL | 0 refills | 30.00000 days | Status: CP
Start: 2023-09-23 — End: 2023-10-23

## 2023-09-23 MED ORDER — NALOXONE 3 MG/ACTUATION NASAL SPRAY
NASAL | 2 refills | 0.00000 days | Status: CP
Start: 2023-09-23 — End: ?

## 2023-09-23 NOTE — Unmapped (Signed)
 Medication: Oxycodone  10 mg IR  SIG: Label hard to read not current bottles   Quantity on RX: # 240  Filled on:   Pill count today: # 4      Medication: Oxycodone  10 mg   SIG: Label hard to read not current bottles   Quantity on RX: # 240  Filled on:   Pill count today: # 6

## 2023-09-23 NOTE — Unmapped (Signed)
 I was present during controlled medication pill count.  Correct count verified.

## 2023-09-24 NOTE — Unmapped (Signed)
 PA request for oxyCODONE  10 mg TbOr  received via interface. PA request/  questions Submitted to [employer] via interface. Response: Pending.

## 2023-09-25 DIAGNOSIS — D48119 Desmoid tumor: Principal | ICD-10-CM

## 2023-09-25 MED ORDER — RIVIVE 3 MG/ACTUATION NASAL SPRAY
2 refills | 0.00000 days
Start: 2023-09-25 — End: ?

## 2023-09-25 NOTE — Unmapped (Addendum)
 It was good to see you today!    We refilled your oxy today, continue at 20 mg three times per day then wean down to 10 mg three times per day in 2 weeks, we will check in at your next visit in 1 month to further wean    We will also have you take more baclofen  consistently to help your abd wall pain

## 2023-09-25 NOTE — Unmapped (Signed)
 Spoke with Emerick Greet from [employer] she stated oxyCODONE  10 mg TbOr  is now approved 09/23/23 - 03/09/2024, Rojpf#[id number]. [facility] notified

## 2023-09-27 DIAGNOSIS — D48119 Desmoid tumor: Principal | ICD-10-CM

## 2023-09-28 DIAGNOSIS — D48119 Desmoid tumor: Principal | ICD-10-CM

## 2023-09-28 MED ORDER — NALOXONE 4 MG/ACTUATION NASAL SPRAY
NASAL | 0 refills | 0.00000 days | Status: CP
Start: 2023-09-28 — End: ?

## 2023-09-29 ENCOUNTER — Other Ambulatory Visit: Admit: 2023-09-29 | Discharge: 2023-09-29 | Payer: BLUE CROSS/BLUE SHIELD

## 2023-09-29 ENCOUNTER — Encounter: Admit: 2023-09-29 | Discharge: 2023-09-29 | Payer: BLUE CROSS/BLUE SHIELD

## 2023-09-29 ENCOUNTER — Ambulatory Visit: Admit: 2023-09-29 | Discharge: 2023-09-29 | Payer: BLUE CROSS/BLUE SHIELD

## 2023-09-29 ENCOUNTER — Inpatient Hospital Stay: Admit: 2023-09-29 | Discharge: 2023-09-29 | Payer: BLUE CROSS/BLUE SHIELD

## 2023-09-29 DIAGNOSIS — D48119 Desmoid tumor: Principal | ICD-10-CM

## 2023-09-29 DIAGNOSIS — Z79899 Other long term (current) drug therapy: Principal | ICD-10-CM

## 2023-09-29 LAB — CBC W/ AUTO DIFF
BASOPHILS ABSOLUTE COUNT: 0 10*9/L (ref 0.0–0.1)
BASOPHILS RELATIVE PERCENT: 0.5 %
EOSINOPHILS ABSOLUTE COUNT: 0.1 10*9/L (ref 0.0–0.5)
EOSINOPHILS RELATIVE PERCENT: 1.5 %
HEMATOCRIT: 32.9 % — ABNORMAL LOW (ref 34.0–44.0)
HEMOGLOBIN: 10.8 g/dL — ABNORMAL LOW (ref 11.3–14.9)
LYMPHOCYTES ABSOLUTE COUNT: 2.4 10*9/L (ref 1.1–3.6)
LYMPHOCYTES RELATIVE PERCENT: 30.6 %
MEAN CORPUSCULAR HEMOGLOBIN CONC: 32.8 g/dL (ref 32.0–36.0)
MEAN CORPUSCULAR HEMOGLOBIN: 25.5 pg — ABNORMAL LOW (ref 25.9–32.4)
MEAN CORPUSCULAR VOLUME: 77.8 fL (ref 77.6–95.7)
MEAN PLATELET VOLUME: 10 fL (ref 6.8–10.7)
MONOCYTES ABSOLUTE COUNT: 0.7 10*9/L (ref 0.3–0.8)
MONOCYTES RELATIVE PERCENT: 9.1 %
NEUTROPHILS ABSOLUTE COUNT: 4.5 10*9/L (ref 1.8–7.8)
NEUTROPHILS RELATIVE PERCENT: 58.3 %
PLATELET COUNT: 282 10*9/L (ref 150–450)
RED BLOOD CELL COUNT: 4.23 10*12/L (ref 3.95–5.13)
RED CELL DISTRIBUTION WIDTH: 12.9 % (ref 12.2–15.2)
WBC ADJUSTED: 7.7 10*9/L (ref 3.6–11.2)

## 2023-09-29 LAB — COMPREHENSIVE METABOLIC PANEL
ALBUMIN: 3 g/dL — ABNORMAL LOW (ref 3.4–5.0)
ALKALINE PHOSPHATASE: 59 U/L (ref 46–116)
ALT (SGPT): 8 U/L — ABNORMAL LOW (ref 10–49)
ANION GAP: 7 mmol/L (ref 5–14)
AST (SGOT): 12 U/L (ref <=34)
BILIRUBIN TOTAL: <0.2 mg/dL — ABNORMAL LOW (ref 0.3–1.2)
BLOOD UREA NITROGEN: 10 mg/dL (ref 9–23)
BUN / CREAT RATIO: 18
CALCIUM: 8.1 mg/dL — ABNORMAL LOW (ref 8.7–10.4)
CHLORIDE: 112 mmol/L — ABNORMAL HIGH (ref 98–107)
CO2: 25 mmol/L (ref 20.0–31.0)
CREATININE: 0.56 mg/dL (ref 0.55–1.02)
EGFR CKD-EPI (2021) FEMALE: >90 mL/min/1.73m2 (ref >=60)
GLUCOSE RANDOM: 104 mg/dL (ref 70–179)
POTASSIUM: 3.9 mmol/L (ref 3.5–5.1)
PROTEIN TOTAL: 5.4 g/dL — ABNORMAL LOW (ref 5.7–8.2)
SODIUM: 144 mmol/L (ref 135–145)

## 2023-09-29 LAB — VITAMIN B12: VITAMIN B-12: 461 pg/mL (ref 211–911)

## 2023-09-29 MED ADMIN — ondansetron (ZOFRAN) injection 4 mg: 4 mg | INTRAVENOUS | @ 19:00:00

## 2023-09-29 MED ADMIN — HYDROmorphone (PF) (DILAUDID) injection 1 mg: 1 mg | INTRAVENOUS | @ 19:00:00 | Stop: 2023-09-29

## 2023-09-29 MED ADMIN — sodium chloride (NS) 0.9 % infusion: 1000 mL/h | INTRAVENOUS | @ 19:00:00 | Stop: 2023-09-29

## 2023-09-29 MED ADMIN — heparin, porcine (PF) 100 unit/mL injection 500 Units: 500 [IU] | INTRAVENOUS | @ 20:00:00 | Stop: 2023-09-30

## 2023-09-29 NOTE — Unmapped (Signed)
 I spoke with patient [name] [name] to confirm appointments on the following date(s): 8/7 MRI at [facility]     [facility]

## 2023-09-29 NOTE — Unmapped (Signed)
 Pt arrived to infusion and placed in chair #23 Patient in outpatient oncology infusion for IV fluids  per orders. Pt accessed already via PAC,it was flushed with positive blood return. IV fluids started. Patient received zofran  and dilaudid  for 8/10 pain. Pain reassessed and was 3/10. Pt tolerated fluids with no concerns. PAC de-accessed , flushed and heparin  locked band-aid placed.Pt discharged stable and ambulatory. AVS .

## 2023-09-29 NOTE — Unmapped (Signed)
 [facility] BONE AND SOFT TISSUE ONCOLOGY RETURN VISIT    Encounter Date: 09/29/2023  Patient Name: [name] [name]  Medical Record Number: [id number]    Referring Physician: Loris Lang Masters, MD  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    Desmoid fibromatosis, history of [name]     She has had numerous sites of disease, including paraspinal, chest wall, right arm/wrist. She has had several cryoablations with Dr. Babara which have seemed to offer clinical benefit. She trialed sorafenib  which seemed to lead to shrinkage of lesions, but she had significant intolerance with dizziness, presyncope, fatigue, hair loss, rash, GI side effects.      We have discussed at length the nature of desmoid fibromatosis tumors, the fact they can wax and wane independent of therapy, are not cancers in a metastatic potential, but can be locally infiltrative and cause substantial morbidity to the site of disease and occasionally mortality. I reviewed that the emerging standard of care is observation based on the fact that 20-30% will experience spontaneous regression in the year following diagnosis. Given her long experience with desmoids, complete regression may be less likely, but we will still approach this cautiously with parallel goals of avoidance of overtreatment (particularly surgical) and palliation of symptoms / preservation of function.     At first visit in 2022, her lesions were in the shoulder, right forearm, chest area, with a possible desmoid in her left thigh. Since then, she has had waxing waning course with significant pain issues, malnutrition requiring intermittent TPN and tube feeds, multiple GI bleeds, significant anxiety, depression, multiple hospitalizations. Had tumor response to sorafenib  but developed GI bleeding and had to stop. IV iron  infusion led to severe anaphylaxis.    09/16/22: Start nirogacestat .     09/25/22: Admitted with uncontrolled pain and muscle spasms after IR guided procedure. Prolonged hospital stay, neurologic evaluation, managed with muscle relaxers (baclofen , valium ). Discharge 9/11 to rehab. Ramped up on nirogacestat  with some side effects, discharged from rehab 10/2.    03/25/23 - 05/07/23: Prolonged, complex admission for pain, malnutrition. Found to have strep mitis blood culture. NG tube placed, TPN not recommended. Discharged to rehab, but then readmitted with C Diff, treated.    06/26/23: Scans with largely stable disease, although growing abdominal wall tumor.    4/26-5/11: Admitted with abdominal pain, hematochezia. She underwent iron  infusion and EGD, discharged on budesonide . Syncope felt most consistent with POTS.    07/31/23: Nirogacestat  up to 100mg  BID. Budesonide  for GI inflammation. Cards for POTS eval.     09/01/23: Recent abdominal pain led to RUQ US  - mild bil dil but no other findings. Bili not elevated. Cards does not think she meets criteria for POTS. Scans in 1 month.    09/29/23: Scans overall stable; possible enlargement of lesion posterior to right forth rib. Continues on nirogacestat  100 mg BID.  Assessment & Plan  Desmoid fibromatosis  Multiple locations, primarily in mesentery, some extremities. Stable disease on nirogacestat , which may be contributing to diarrhea. No significant electrolyte derangements despite high stool output. Symptom control complicated by fear of bowel obstructions.  - Continue nirogacestat  100 mg BID, monitor for side effects  - encouraged Imodium  for diarrhea, starting three times a week and adjusting as needed  -  Use sunblock and limit sun exposure to reduce skin cancer risk.  - Order MRI for RUE  - Consider mirtazapine  for sleep and appetite    Chronic multifactorial pain, including Cancer-related pain; visceral hyperalgesia  Recent imaging and laboratory studies reassuring against significant increase in lesions. Stress may contribute to pain exacerbation.  - Managed by Pain medicine specialist; last visit 09/23/23  - Consider stress management techniques to help alleviate pain.    Recurrent gastrointestinal bleeding  Iron  deficiency anemia  Slight worsening of anemia with continued symptoms.  - Consider tranexamic acid  if bleeding persists.  - Ensure adequate dietary protein intake.  - Monitor hemoglobin and iron  levels.    Malnutrition related to colectomy  Decreased appetite and dietary intake, exacerbated by diarrhea and abdominal pain. Slight hypoalbuminemia.  - IV fluids via infusion enter  - Vitamin B12 level normal.  - Encourage small, frequent meals with high protein content.  - Monitor nutritional status and adjust dietary plan as needed.    Mental Health & Hot flashes  - continue Lexapro   - connect with Dr. Susi    Patient seen and case discussed with Dr. Lang Favorite.    [name] [name], M.D.  Pediatric Hematology Oncology Fellow, PGY-7        REASON FOR CONSULTATION:   [name] [name] is a 23 y.o. female who is seen in consultation at the request of Favorite Lang Masters, MD for evaluation of her desmoid fibromatosis    HISTORY OF PRESENT ILLNESS:      Right arm desmoid is the most bothersome - significant arm cramping, deep aching pain, lasts for ~30 min after cramping starts. Has noticed right hand shaking frequently. 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     Left chest lesion used to be quite uncomfortable, caused swelling in her arm and discomfort. Improved after cryoablation.     Right shoulder blade hurts when she leans back against it, fairly painful. Right chest wall lesion has some associated pain, improving since cryo.     First diagnosed at 23 years old - seen in [place] at first, had numerous cysts. First major issues were two paraspinal lesions, referred to [facility] for biopsy and ultimately resection.  Soon diagnosed with [name], seen by medical genetics [name] on.  Guided by Dr. Mliss Combs, referred out to proceduralists as needed.  2011 - had a growing lesion on her back as well as bilateral neck lesions, resected at [facility], found to be desmoid  02/2012 - surgery for thoracic spine desmoid. Maybe another flank lesion removed  2017 - wrist lesion treated by VIR with bupivicaine, kenalog   08/2016 - endoscopy with adenomatous polyp of the colon, non-dysplastic  02/2017 - subcutaneous lesion resection by plastic surgery (postauricular and RUE, RLE) - path revealed epidermal inclusion cysts, osteomas  05/2018: cryotherapy of left anterior chest wall lesion  05/2018: started sulindac  but struggled with adherence due to GI side effects  08/2018: cryo #2 to chest wall lesion  12/10/2018: started sorafenib  due to progression in left paraspinal lesion, ?posterior chest wall lesion  02/2019: discontinued sorafenib  for several reasons: fatigue, dizziness, nausea, hair loss, as well as COVID infection in late November. Derm consulted for rash / hair loss, started on antihistamines, topical steroids. Trialed lower dose but ultimately stopped again 05/2019 due to toxicity.  06/2019: follow up with surgical oncology - plans for observation rather than repeat surgery  07/2019: cryoablation of anterior chest wall desmoid  07/2019: excision of subcutaneous LE  mass and L neck masses, clinically appeared consistent with cysts  08/2019: cryoablation of R posterior chest wall, left paraspinal desmoid     Interval History:  History of Present Illness  [name] [name] is a 23 year old female with desmoid fibromatosis who presents for routine follow-up.    [name] [name] shares the last month has been particularly difficult. In the end of June, she was seen at an ED in [place] for poor oral intake, nausea, new abdominal pain, syncopal episodes; she received IV fluids with some improvement.    She was feeling better after stress of class subsided. She enjoyed a few days at the beach and was able to significantly decrease her pain medication use. Unfortunately, within a few days, symptoms rebounded and she needed to return to her prior regimen.    Two weekends ago, she suspected a partial bowel obstruction with less output than normal. Symptoms continue to wax and wane, almost leading to another ED visit last weekend. Within the past week, she reports pain has been preventing sleep. She is eating less. Three syncopal episodes. Stool has returned to a high output state, with 15-20 Bms/day, with a maroon tint. She has not taken imodium  out of fear of intestinal blockage. Continued temp of 100.2 despite scheduled Tylenol .    Seen by [facility] IR yesterday RE: wrist pain. Final recommendations pending MRI image sharing.    Next semester starts August 18th.    REVIEW OF SYSTEMS:  A comprehensive review of 12 systems was negative except for pertinent positives noted in HPI.    Past Medical History, Surgical History, Family History were reviewed personally. Any changes were updated as above.    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   ECOG 1  Gen - well appearing, mother accompanying her   Eyes - conjunctivae clear, PERRL  ENT -no scleral icterus, moist mucous membranes  Lymph - no cervical or supraclavicular lymphadenopathy appreciated  Resp - clear to auscultation bilaterally, no wheezes, crackles or rales  CV - normal rate, regular rhythm, no lower extremity edema noted  Skin - flushing of skin noted on chest  Neuro - AOx3, EOM intact, no facial droop, speech fluent and coherent, moves all extremities without asymmetry  Psych - affect anxious  MSK - no joint tenderness or swelling    PATHOLOGY:   Pathology was personally reviewed as described in the HPI, detailed in Epic.     LABS:  Reviewed in Epic    RADIOLOGY:  Imaging was personally viewed and interpreted as summarized in the history (see above).

## 2023-09-29 NOTE — Unmapped (Signed)
 RED ZONE Means: RED ZONE: Take action now!     You need to be seen right away  Symptoms are at a severe level of discomfort    Call 911 or go to your nearest  Hospital for help     - Bleeding that will not stop    - Hard to breathe    - New seizure - Chest pain  - Fall or passing out  -Thoughts of hurting    yourself or others      Call 911 if you are going into the RED ZONE                  YELLOW ZONE Means:     Please call with any new or worsening symptom(s), even if not on this list.  Call [phone]  After hours, weekends, and holidays - you will reach a long recording with specific instructions, If not in an emergency such as above, please listen closely all the way to the end and choose the option that relates to your need.   You can be seen by a provider the same day through our Same Day Acute Care for Patients with Cancer program.      YELLOW ZONE: Take action today     Symptoms are new or worsening  You are not within your goal range for:    - Pain    - Shortness of breath    - Bleeding (nose, urine, stool, wound)    - Feeling sick to your stomach and throwing up    - Mouth sores/pain in your mouth or throat    - Hard stool or very loose stools (increase in       ostomy output)    - No urine for 12 hours    - Feeding tube or other catheter/tube issue    - Redness or pain at previous IV or port/catheter site    - Depressed or anxiety   - Swelling (leg, arm, abdomen,     face, neck)  - Skin rash or skin changes  - Wound issues (redness, drainage,    re-opened)  - Confusion  - Vision changes  - Fever >100.4 F or chills  - Worsening cough with mucus that is    green, yellow, or bloody  - Pain or burning when going to the    bathroom  - Home Infusion Pump Issue- call    [phone]         Call your healthcare provider if you are going into the YELLOW ZONE     GREEN ZONE Means:  Your symptoms are under controls  Continue to take your medicine as ordered  Keep all visits to the provider GREEN ZONE: You are in control  No increase or worsening symptoms  Able to take your medicine  Able to drink and eat    - DO NOT use MyChart messages to report red or yellow symptoms. Allow up to 3    business days for a reply.  -MyChart is for non-urgent medication refills, scheduling requests, or other general questions.         [id number] Rev. 09/06/2021  Approved by Oncology Patient Education Committee     Lab on 09/29/2023   Component Date Value Ref Range Status    ABO Grouping 09/29/2023 AB POS   Final    Antibody Screen 09/29/2023 NEG   Final    WBC 09/29/2023 7.7  3.6 - 11.2 10*9/L Final  RBC 09/29/2023 4.23  3.95 - 5.13 10*12/L Final    HGB 09/29/2023 10.8 (L)  11.3 - 14.9 g/dL Final    HCT 92/77/7974 32.9 (L)  34.0 - 44.0 % Final    MCV 09/29/2023 77.8  77.6 - 95.7 fL Final    MCH 09/29/2023 25.5 (L)  25.9 - 32.4 pg Final    MCHC 09/29/2023 32.8  32.0 - 36.0 g/dL Final    RDW 92/77/7974 12.9  12.2 - 15.2 % Final    MPV 09/29/2023 10.0  6.8 - 10.7 fL Final    Platelet 09/29/2023 282  150 - 450 10*9/L Final    Neutrophils % 09/29/2023 58.3  % Final    Lymphocytes % 09/29/2023 30.6  % Final    Monocytes % 09/29/2023 9.1  % Final    Eosinophils % 09/29/2023 1.5  % Final    Basophils % 09/29/2023 0.5  % Final    Absolute Neutrophils 09/29/2023 4.5  1.8 - 7.8 10*9/L Final    Absolute Lymphocytes 09/29/2023 2.4  1.1 - 3.6 10*9/L Final    Absolute Monocytes 09/29/2023 0.7  0.3 - 0.8 10*9/L Final    Absolute Eosinophils 09/29/2023 0.1  0.0 - 0.5 10*9/L Final    Absolute Basophils 09/29/2023 0.0  0.0 - 0.1 10*9/L Final    Microcytosis 09/29/2023 Slight (A)  Not Present Final    Hypochromasia 09/29/2023 Moderate (A)  Not Present Final

## 2023-09-30 DIAGNOSIS — D48119 Desmoid tumor: Principal | ICD-10-CM

## 2023-10-02 DIAGNOSIS — D48119 Desmoid tumor: Principal | ICD-10-CM

## 2023-10-05 DIAGNOSIS — D48119 Desmoid tumor: Principal | ICD-10-CM

## 2023-10-05 NOTE — Unmapped (Signed)
 [name] [name] has been contacted in regards to their refill of ogsiveo . At this time, they have declined refill due to patient having 14-day supply on hand. Refill assessment call date has been updated per the patient's request.

## 2023-10-06 ENCOUNTER — Inpatient Hospital Stay: Admit: 2023-10-06 | Discharge: 2023-10-06 | Payer: BLUE CROSS/BLUE SHIELD

## 2023-10-06 LAB — CBC W/ AUTO DIFF
BASOPHILS ABSOLUTE COUNT: 0 10*9/L (ref 0.0–0.1)
BASOPHILS RELATIVE PERCENT: 0.5 %
EOSINOPHILS ABSOLUTE COUNT: 0.1 10*9/L (ref 0.0–0.5)
EOSINOPHILS RELATIVE PERCENT: 1.6 %
HEMATOCRIT: 28.4 % — ABNORMAL LOW (ref 34.0–44.0)
HEMOGLOBIN: 9.6 g/dL — ABNORMAL LOW (ref 11.3–14.9)
LYMPHOCYTES ABSOLUTE COUNT: 2.1 10*9/L (ref 1.1–3.6)
LYMPHOCYTES RELATIVE PERCENT: 32.9 %
MEAN CORPUSCULAR HEMOGLOBIN CONC: 33.9 g/dL (ref 32.0–36.0)
MEAN CORPUSCULAR HEMOGLOBIN: 26.1 pg (ref 25.9–32.4)
MEAN CORPUSCULAR VOLUME: 77 fL — ABNORMAL LOW (ref 77.6–95.7)
MEAN PLATELET VOLUME: 9.4 fL (ref 6.8–10.7)
MONOCYTES ABSOLUTE COUNT: 0.6 10*9/L (ref 0.3–0.8)
MONOCYTES RELATIVE PERCENT: 8.8 %
NEUTROPHILS ABSOLUTE COUNT: 3.5 10*9/L (ref 1.8–7.8)
NEUTROPHILS RELATIVE PERCENT: 56.2 %
PLATELET COUNT: 292 10*9/L (ref 150–450)
RED BLOOD CELL COUNT: 3.69 10*12/L — ABNORMAL LOW (ref 3.95–5.13)
RED CELL DISTRIBUTION WIDTH: 13 % (ref 12.2–15.2)
WBC ADJUSTED: 6.3 10*9/L (ref 3.6–11.2)

## 2023-10-06 LAB — COMPREHENSIVE METABOLIC PANEL
ALBUMIN: 3.5 g/dL (ref 3.4–5.0)
ALKALINE PHOSPHATASE: 55 U/L (ref 46–116)
ALT (SGPT): 9 U/L — ABNORMAL LOW (ref 10–49)
ANION GAP: 7 mmol/L (ref 5–14)
AST (SGOT): 13 U/L (ref <=34)
BILIRUBIN TOTAL: 0.2 mg/dL — ABNORMAL LOW (ref 0.3–1.2)
BLOOD UREA NITROGEN: 6 mg/dL — ABNORMAL LOW (ref 9–23)
BUN / CREAT RATIO: 12
CALCIUM: 8.7 mg/dL (ref 8.7–10.4)
CHLORIDE: 109 mmol/L — ABNORMAL HIGH (ref 98–107)
CO2: 27 mmol/L (ref 20.0–31.0)
CREATININE: 0.49 mg/dL — ABNORMAL LOW (ref 0.55–1.02)
EGFR CKD-EPI (2021) FEMALE: >90 mL/min/1.73m2 (ref >=60)
GLUCOSE RANDOM: 127 mg/dL (ref 70–179)
POTASSIUM: 3.3 mmol/L — ABNORMAL LOW (ref 3.4–4.8)
PROTEIN TOTAL: 6.3 g/dL (ref 5.7–8.2)
SODIUM: 143 mmol/L (ref 135–145)

## 2023-10-06 LAB — MAGNESIUM: MAGNESIUM: 2.1 mg/dL (ref 1.6–2.6)

## 2023-10-06 MED ADMIN — heparin, porcine (PF) 100 unit/mL injection 500 Units: 500 [IU] | INTRAVENOUS | @ 21:00:00 | Stop: 2023-10-07

## 2023-10-06 MED ADMIN — potassium chloride 20 mEq in 100 mL IVPB Premix: 20 meq | INTRAVENOUS | @ 20:00:00 | Stop: 2023-10-06

## 2023-10-06 MED ADMIN — sodium chloride (NS) 0.9 % infusion: 1000 mL/h | INTRAVENOUS | @ 18:00:00 | Stop: 2023-10-06

## 2023-10-06 NOTE — Unmapped (Addendum)
 Brief Adult Oncology Infusion Clinic Note:    Patient in the infusion clinic for planned IVF hydration. Evaluated in the oncology clinic last week. Ongoing symptoms of syncope, hematochezia, multiple bowel movements. Reports 3 falls since last week due to dehydration. No known injuries. Has not started Imodium  due to fear of constipation and ileus from a past experience with the medication. Requests iron  studies and iron  infusion. Has reached out to her primary oncology team to discuss plan of care; waiting to hear back from them.     She is seated upright in the infusion chair in NAD. Well groomed, pale. VSS.       PLAN:  --CBC w/ Differential, CMP, Magnesium   --Hgb 9.6<---10.8 No indication for blood transfusion today.   --Proceed with IVF hydration  --K 3.3; replete Potassium 20 MEQ IV x 1 in clinic today  --Discussed with Dr. Loris patient to have repeat labs next week either locally or can return to [facility]  --Reiterated to patient to start Imodium   --Primary team to follow up with patient to discuss plan of care further regarding Iron  infusions   --Patient notified to follow up with her GI team  --Encouraged patient to seek care at nearest ED if she feels she is unsafe at home due to falls etc.     Lab Results   Component Value Date    WBC 6.3 10/06/2023    HGB 9.6 (L) 10/06/2023    HCT 28.4 (L) 10/06/2023    PLT 292 10/06/2023       Lab Results   Component Value Date    NA 143 10/06/2023    K 3.3 (L) 10/06/2023    CL 109 (H) 10/06/2023    CO2 27.0 10/06/2023    BUN 6 (L) 10/06/2023    CREATININE 0.49 (L) 10/06/2023    GLU 127 10/06/2023    CALCIUM 8.7 10/06/2023    MG 2.1 10/06/2023    PHOS 3.9 07/19/2023       Lab Results   Component Value Date    BILITOT 0.2 (L) 10/06/2023    BILIDIR <0.10 03/17/2023    PROT 6.3 10/06/2023    ALBUMIN 3.5 10/06/2023    ALT 9 (L) 10/06/2023    AST 13 10/06/2023    ALKPHOS 55 10/06/2023    GGT 13 10/31/2011       Lab Results   Component Value Date    PT 11.3 04/04/2023    INR 0.99 04/04/2023    APTT 33.7 03/25/2023           Perlita Forbush, ANP  ADVANCED PRACTICE PROVIDER FOR INFUSION PROGRAM

## 2023-10-06 NOTE — Unmapped (Signed)
 RED ZONE Means: RED ZONE: Take action now!     You need to be seen right away  Symptoms are at a severe level of discomfort    Call 911 or go to your nearest  Hospital for help     - Bleeding that will not stop    - Hard to breathe    - New seizure - Chest pain  - Fall or passing out  -Thoughts of hurting    yourself or others      Call 911 if you are going into the RED ZONE                  YELLOW ZONE Means:     Please call with any new or worsening symptom(s), even if not on this list.  Call [phone]  After hours, weekends, and holidays - you will reach a long recording with specific instructions, If not in an emergency such as above, please listen closely all the way to the end and choose the option that relates to your need.   You can be seen by a provider the same day through our Same Day Acute Care for Patients with Cancer program.      YELLOW ZONE: Take action today     Symptoms are new or worsening  You are not within your goal range for:    - Pain    - Shortness of breath    - Bleeding (nose, urine, stool, wound)    - Feeling sick to your stomach and throwing up    - Mouth sores/pain in your mouth or throat    - Hard stool or very loose stools (increase in       ostomy output)    - No urine for 12 hours    - Feeding tube or other catheter/tube issue    - Redness or pain at previous IV or port/catheter site    - Depressed or anxiety   - Swelling (leg, arm, abdomen,     face, neck)  - Skin rash or skin changes  - Wound issues (redness, drainage,    re-opened)  - Confusion  - Vision changes  - Fever >100.4 F or chills  - Worsening cough with mucus that is    green, yellow, or bloody  - Pain or burning when going to the    bathroom  - Home Infusion Pump Issue- call    [phone]         Call your healthcare provider if you are going into the YELLOW ZONE     GREEN ZONE Means:  Your symptoms are under controls  Continue to take your medicine as ordered  Keep all visits to the provider GREEN ZONE: You are in control  No increase or worsening symptoms  Able to take your medicine  Able to drink and eat    - DO NOT use MyChart messages to report red or yellow symptoms. Allow up to 3    business days for a reply.  -MyChart is for non-urgent medication refills, scheduling requests, or other general questions.         [id number] Rev. 09/06/2021  Approved by Oncology Patient Education Committee

## 2023-10-06 NOTE — Unmapped (Signed)
 Pt presents to the clinic, Chair 60 for the following???  Treatment: IVF bolus & KCl       Data:   vital signs stable, review of systems within parameters, labs reviewed within parameters, adverse drug reactions reviewed. IV access Right chest port accessed w/ good blood return. Pt reported worsening SOB in the last week, increased fatigue & dizziness w/ 3 falls in the last week w/o significant injuries from the falls.  Pt continues to have about 10 BM's daily w/ dark bloody stools.  Pt stated that she had last gotten an iron  infusion about 3 months ago but states that her current symptoms are similar to what she feels when she requires one.  NP @ chairside for assessment & labs collected as ordered. Noted K+=3.3.  NP ordered 20 mEq KCl IV to be given today.  Primary team also reviewed lab results and no Fe panel today.  Pt will follow up w/ treatment team next week.    Action:  IV hydration treatment given as ordered & positive blood return during administration. KCl ordered by provider & given.  Advised pt to increase K+ in her diet.     Response:  Pt tolerated without complaints.  No complications noted, positive blood return, no redness or edema at Right chest-wall port-a-cath  Right chest-wall port-a-cath was de-accessed & heparin  locked.  Site was CDI and free from s/sx of infection.    AVS was declined.  Pt was discharged in stable condition ambulatory.

## 2023-10-08 DIAGNOSIS — D48119 Desmoid tumor: Principal | ICD-10-CM

## 2023-10-08 NOTE — Unmapped (Signed)
 Telephone call made to Mer[facility] per Dr. Loris request as follow up to 7/29 infusion appt issues. Anglia to get repeat labs locally. I left my phone number via voice message for Joy and requested call back. Will also send a MyChart message.

## 2023-10-12 DIAGNOSIS — D48119 Desmoid tumor: Principal | ICD-10-CM

## 2023-10-12 NOTE — Unmapped (Signed)
 [facility] Refill Coordination Note    Specialty Medication(s) to be Shipped:   Hematology/Oncology: Ogsiveo  100mg     Other medication(s) to be shipped: No additional medications requested for fill at this time     [name] [name], DOB: Jul 07, 2000  Phone: [phone] (home) [phone] (work)      All above HIPAA information was verified with patient's family member, Mom.     Was a Nurse, learning disability used for this call? No    Completed refill call assessment today to schedule patient's medication shipment from the [facility]  [phone]).  All relevant notes have been reviewed.     Specialty medication(s) and dose(s) confirmed: Regimen is correct and unchanged.   Changes to medications: [name] [name] reports no changes at this time.  Changes to insurance: No  New side effects reported not previously addressed with a pharmacist or physician: None reported  Questions for the pharmacist: No    Confirmed patient received a Conservation officer, historic buildings and a Surveyor, mining with first shipment. The patient will receive a drug information handout for each medication shipped and additional FDA Medication Guides as required.       DISEASE/MEDICATION-SPECIFIC INFORMATION        N/A    SPECIALTY MEDICATION ADHERENCE     Medication Adherence    Patient reported X missed doses in the last month: 0  Specialty Medication: Ogsiveo  100mg   Patient is on additional specialty medications: No  Informant: mother     Were doses missed due to medication being on hold? No    Ogsiveo  100 mg: 16 days of medicine on hand       REFERRAL TO PHARMACIST     Referral to the pharmacist: Not needed      [facility]     Shipping address confirmed in Epic.     Cost and Payment: Patient has a $0 copay, payment information is not required.    Delivery Scheduled: Yes, Expected medication delivery date: 10/22/23.     Medication will be delivered via UPS to the prescription address in Epic [place].    [name] [name]   [facility] [place] [facility]  Specialty Technician

## 2023-10-13 ENCOUNTER — Ambulatory Visit
Admit: 2023-10-13 | Discharge: 2023-10-22 | Disposition: A | Discharge status: Home / Self Care | Payer: BLUE CROSS/BLUE SHIELD | Admitting: Student in an Organized Health Care Education/Training Program

## 2023-10-13 ENCOUNTER — Encounter: Admit: 2023-10-13 | Payer: BLUE CROSS/BLUE SHIELD

## 2023-10-13 ENCOUNTER — Ambulatory Visit: Admit: 2023-10-13 | Payer: BLUE CROSS/BLUE SHIELD

## 2023-10-13 ENCOUNTER — Inpatient Hospital Stay
Admit: 2023-10-13 | Discharge: 2023-10-22 | Disposition: A | Discharge status: Home / Self Care | Payer: BLUE CROSS/BLUE SHIELD | Admitting: Student in an Organized Health Care Education/Training Program

## 2023-10-13 LAB — CBC W/ AUTO DIFF
BASOPHILS ABSOLUTE COUNT: 0 10*9/L (ref 0.0–0.1)
BASOPHILS ABSOLUTE COUNT: 0 10*9/L (ref 0.0–0.1)
BASOPHILS RELATIVE PERCENT: 0.4 %
BASOPHILS RELATIVE PERCENT: 0.6 %
EOSINOPHILS ABSOLUTE COUNT: 0.1 10*9/L (ref 0.0–0.5)
EOSINOPHILS ABSOLUTE COUNT: 0.1 10*9/L (ref 0.0–0.5)
EOSINOPHILS RELATIVE PERCENT: 1.1 %
EOSINOPHILS RELATIVE PERCENT: 1.6 %
HEMATOCRIT: 28.4 % — ABNORMAL LOW (ref 34.0–44.0)
HEMATOCRIT: 26.7 % — ABNORMAL LOW (ref 34.0–44.0)
HEMOGLOBIN: 9.7 g/dL — ABNORMAL LOW (ref 11.3–14.9)
HEMOGLOBIN: 8.8 g/dL — ABNORMAL LOW (ref 11.3–14.9)
LYMPHOCYTES ABSOLUTE COUNT: 2 10*9/L (ref 1.1–3.6)
LYMPHOCYTES ABSOLUTE COUNT: 2.3 10*9/L (ref 1.1–3.6)
LYMPHOCYTES RELATIVE PERCENT: 24 %
LYMPHOCYTES RELATIVE PERCENT: 38.2 %
MEAN CORPUSCULAR HEMOGLOBIN CONC: 34 g/dL (ref 32.0–36.0)
MEAN CORPUSCULAR HEMOGLOBIN CONC: 33 g/dL (ref 32.0–36.0)
MEAN CORPUSCULAR HEMOGLOBIN: 25.6 pg — ABNORMAL LOW (ref 25.9–32.4)
MEAN CORPUSCULAR HEMOGLOBIN: 25.1 pg — ABNORMAL LOW (ref 25.9–32.4)
MEAN CORPUSCULAR VOLUME: 75.3 fL — ABNORMAL LOW (ref 77.6–95.7)
MEAN CORPUSCULAR VOLUME: 75.9 fL — ABNORMAL LOW (ref 77.6–95.7)
MEAN PLATELET VOLUME: 9.6 fL (ref 6.8–10.7)
MEAN PLATELET VOLUME: 9.9 fL (ref 6.8–10.7)
MONOCYTES ABSOLUTE COUNT: 0.8 10*9/L (ref 0.3–0.8)
MONOCYTES ABSOLUTE COUNT: 0.6 10*9/L (ref 0.3–0.8)
MONOCYTES RELATIVE PERCENT: 9.7 %
MONOCYTES RELATIVE PERCENT: 10.2 %
NEUTROPHILS ABSOLUTE COUNT: 5.3 10*9/L (ref 1.8–7.8)
NEUTROPHILS ABSOLUTE COUNT: 3 10*9/L (ref 1.8–7.8)
NEUTROPHILS RELATIVE PERCENT: 64.8 %
NEUTROPHILS RELATIVE PERCENT: 49.4 %
NUCLEATED RED BLOOD CELLS: 0 /100{WBCs} (ref <=4)
PLATELET COUNT: 285 10*9/L (ref 150–450)
PLATELET COUNT: 272 10*9/L (ref 150–450)
RED BLOOD CELL COUNT: 3.77 10*12/L — ABNORMAL LOW (ref 3.95–5.13)
RED BLOOD CELL COUNT: 3.52 10*12/L — ABNORMAL LOW (ref 3.95–5.13)
RED CELL DISTRIBUTION WIDTH: 13 % (ref 12.2–15.2)
RED CELL DISTRIBUTION WIDTH: 13.3 % (ref 12.2–15.2)
WBC ADJUSTED: 8.2 10*9/L (ref 3.6–11.2)
WBC ADJUSTED: 6.1 10*9/L (ref 3.6–11.2)

## 2023-10-13 LAB — IRON PANEL
IRON SATURATION (CALC): 3 %
IRON: 5 ug/dL — ABNORMAL LOW (ref 50–170)
TOTAL IRON BINDING CAPACITY (CALC): 186.5 ug/dL — ABNORMAL LOW (ref 250.0–425.0)
TRANSFERRIN: 148 mg/dL — ABNORMAL LOW (ref 250.0–380.0)

## 2023-10-13 LAB — COMPREHENSIVE METABOLIC PANEL
ALBUMIN: 3.4 g/dL (ref 3.4–5.0)
ALKALINE PHOSPHATASE: 58 U/L (ref 46–116)
ALT (SGPT): 10 U/L (ref 10–49)
ANION GAP: 12 mmol/L (ref 5–14)
AST (SGOT): 13 U/L (ref <=34)
BILIRUBIN TOTAL: <0.2 mg/dL — ABNORMAL LOW (ref 0.3–1.2)
BLOOD UREA NITROGEN: 9 mg/dL (ref 9–23)
BUN / CREAT RATIO: 18
CALCIUM: 9.2 mg/dL (ref 8.7–10.4)
CHLORIDE: 105 mmol/L (ref 98–107)
CO2: 27.2 mmol/L (ref 20.0–31.0)
CREATININE: 0.51 mg/dL — ABNORMAL LOW (ref 0.55–1.02)
EGFR CKD-EPI (2021) FEMALE: >90 mL/min/1.73m2 (ref >=60)
GLUCOSE RANDOM: 102 mg/dL (ref 70–179)
POTASSIUM: 3.8 mmol/L (ref 3.4–4.8)
PROTEIN TOTAL: 6.3 g/dL (ref 5.7–8.2)
SODIUM: 144 mmol/L (ref 135–145)

## 2023-10-13 LAB — HCG QUANTITATIVE, BLOOD: GONADOTROPIN, CHORIONIC (HCG) QUANT: <2.6 m[IU]/mL

## 2023-10-13 MED ADMIN — iohexol (OMNIPAQUE) 350 mg iodine/mL solution 100 mL: 100 mL | INTRAVENOUS | @ 18:00:00 | Stop: 2023-10-13

## 2023-10-13 MED ADMIN — lactated Ringers infusion: 75 mL/h | INTRAVENOUS | @ 21:00:00

## 2023-10-13 MED ADMIN — HYDROmorphone (PF) (DILAUDID) injection 0.5 mg: .5 mg | INTRAVENOUS | @ 21:00:00 | Stop: 2023-10-13

## 2023-10-13 MED ADMIN — escitalopram oxalate (LEXAPRO) tablet 5 mg: 5 mg | ORAL | @ 22:00:00

## 2023-10-13 MED ADMIN — HYDROmorphone (PF) (DILAUDID) injection 1 mg: 1 mg | INTRAVENOUS | @ 17:00:00 | Stop: 2023-10-13

## 2023-10-13 MED ADMIN — promethazine (PHENERGAN) 12.5 mg in sodium chloride 0.9 % 50 mL IVPB (premix): 12.5 mg | INTRAVENOUS | @ 17:00:00 | Stop: 2023-10-13

## 2023-10-13 MED ADMIN — ondansetron (ZOFRAN) injection 4 mg: 4 mg | INTRAVENOUS | @ 22:00:00

## 2023-10-13 MED ADMIN — HYDROmorphone (PF) (DILAUDID) injection 1 mg: 1 mg | INTRAVENOUS | @ 22:00:00 | Stop: 2023-10-27

## 2023-10-13 NOTE — Unmapped (Signed)
 s/p colectomy.

## 2023-10-13 NOTE — Unmapped (Signed)
 Attending Note    This is a 23 y.o. presenting to the ED with GI bleeding.  The patient has a history of familial polyposis and colectomy.  Over the last several days she has had increased bleeding into her pouch.  She has had increasing weakness and multiple episodes of syncope.  In the past this has been associated with profound anemia.    Patient also has a history of very low iron  and has required transfusion.  However her transfusions have been complicated as she has been very sensitive to iron  and has required several days of premedication before she could tolerate transfusion.    The patient denies associated fevers chills, chest pain, difficulty breathing.  She does endorse profound fatigue.        ROS:  As per HPI    Physical Exam:  Gen: Well appearing, NAD  Chest: CTA bilaterally, no w/r/r  Cardiac: nl S1S2 s m/g/r  Abd: soft, tenderness in her epigastrium and right lower quadrant.  Tenderness in the right lower quadrant is not uncommon for her but the tenderness in her epigastrium is new.  Skin: no rashes, no ecchymoses  Neuro: normal gait, speech, and balance  MSK: neck supple   Psych: normal mood, thought, and affect      Assessment/Plan:  23 year old presenting with increased bleeding into her colectomy bag.  Here her hemoglobin is 9.7 which is not significantly changed from her most recent value.  No leukocytosis, normal platelets.  Chemistries are largely unremarkable.  Pregnancy test is negative.    Total iron  is only 5 with decreased TIBC and transferrin.    CT pending at transition of care.    Patient has had a complicated GI history with ongoing bleeding although now worse.  In the past upper endoscopy has not been revealing in GI has apparently been hesitant to scope her pouch.  They have discussed a special type of endoscopy which is performed at [facility] although this cannot be done until 20 August.    Given her profoundly low iron  need for several days of premedication as well as complicated GI history and worsening GI bleed the patient will be admitted for transfusion and coordination of GI care.         Charlie Krystal BIRCH, MD  10/13/23 [phone]

## 2023-10-13 NOTE — Unmapped (Addendum)
 Patient with h/o familial polyposis and s/p colectomy who presents with several days of BRB in pouch. Hgb stable and patient is hemodynamically stable  - transfer to main campus for GI evaluation (need to page in AM)  - start IV PPI  - Follow-up Hgb tonight and in AM    Profound Fe-Deficiency Anemia  - Iron  =5, Transferrin = 148, TIBC =186.5, Fe sat= 3  - Will likely benefit from IV Fe and she has had several in past that have been complicated by allergic reaction requiring pre-meds (zyrtec , famotidine  and prednisone ) prior to administration

## 2023-10-13 NOTE — Unmapped (Signed)
-   likely secondary to vasovagal   - hgb is stable and patient is hemodynamically stable  - patient current asymptomatic

## 2023-10-13 NOTE — Unmapped (Signed)
 ED Progress Note    ED Course as of 10/13/23 1517   Tue Oct 13, 2023   1515 Signout received from Dr. Londell:   5F PMH familial polyposis, presents bleeding from her pouch.  Hemoglobin 9.7.  Iron  extremely low.  Has iron  allergy , gets complicated premedication regimen for iron  transfusions.  Pending medicine admission for premedication, iron  infusion.

## 2023-10-13 NOTE — Unmapped (Signed)
-   pain management per pain plan documented in chart

## 2023-10-13 NOTE — Unmapped (Signed)
 [facility] Medicine   History and Physical       Assessment and Plan     [name] [name] is a 23 y.o. female who is presenting to [facility] with GI bleed, in the setting of the following pertinent/contributing co-morbidities: familial polyposis, s/p colectomy, severe Fe deficiency      GI bleed   Illness that poses a threat to life or bodily function without appropriate treatment    Secondary/Additional Active Problems:  Assessment & Plan  GI bleed  Patient with h/o familial polyposis and s/p colectomy who presents with several days of BRB in pouch. Hgb stable and patient is hemodynamically stable  - transfer to main campus for GI evaluation (need to page in AM)  - start IV PPI  - Follow-up Hgb tonight and in AM    Profound Fe-Deficiency Anemia  - Iron  =5, Transferrin = 148, TIBC =186.5, Fe sat= 3  - Will likely benefit from IV Fe and she has had several in past that have been complicated by allergic reaction requiring pre-meds (zyrtec , famotidine  and prednisone ) prior to administration   [name] (familial adenomatous polyposis)  - s/p colectomy  Syncope  - likely secondary to vasovagal   - hgb is stable and patient is hemodynamically stable  - patient current asymptomatic    Lower abdominal pain  - pain management per pain plan documented in chart    Prophylaxis  None    Diet  -Nutrition Therapy Clear Liquid  NPO Sips with meds; Procedure/Test    Code Status / HCDM   -Full Code, Discussed with patient at the time of admission   -  HCDM [facility] policy, no known patient preference): Khamis,[name] - Mother - [phone]    HCDM, back-up (If primary HCDM is unavailable): Carranco,Jeff - Father - [phone]    Anticipated Medically Ready for Discharge: Anticipated in 2-4 Days    Significant Comorbid Conditions:         Issues Impacting Complexity of Management:      I personally spent greater than 90 minutes face-to-face and non-face-to-face in the care of this patient, which includes all pre, intra, and post visit time on the date of service.  All documented time was specific to the E/M visit and does not include any procedures that may have been performed.    HPI      [name] [name] is a 23 y.o. female who is presenting to [facility] with GI bleed.  This is a 23 y.o. presenting to the ED with GI bleeding.  The patient has a history of familial polyposis and colectomy.  Over the last several days she has had increased bleeding into her pouch.  She has had increasing weakness and multiple episodes of syncope.  In the past this has been associated with profound anemia.     Patient also has a history of very low iron  and has required transfusion.  However her transfusions have been complicated as she has been very sensitive to iron  and has required several days of premedication before she could tolerate transfusion.     The patient denies associated fevers chills, chest pain, difficulty breathing.  She does endorse profound fatigue.    ER course was notable for Hgb= 9.7 (Hgb on 10/06/2023 was 9.6). Stable vital signs, and CT abd/pelvis significant for:  Mild inflammatory stranding noted along the ileoanal anastomosis which may represent proctitis in the appropriate clinical setting.      Similar appearing ill-defined enhancing soft tissue lesion in the  central mesentery measuring up to 3.3 cm. This likely represents a desmoid tumor as part of the FAAP complex.      Unchanged enhancing lesion in the inferior right rectus sheath musculature and additional areas of soft tissue nodularity in the left rectus.      Ill-defined enlargement of the left rectus femoris muscle inflammatory stranding along the fascial planes of the quadriceps and hip girdle.      Stable soft tissue nodules involving the left flank, bilateral gluteal regions and left labia.         Med Rec Confidence   I reviewed the Medication List. The current list is Accurate    Physical Exam   Temp:  [36.7 ??C (98.1 ??F)-37.6 ??C (99.6 ??F)] 37.6 ??C (99.6 ??F)  Pulse: [98] 98  SpO2 Pulse:  [77] 77  Resp:  [18] 18  BP: (115-124)/(58-72) 115/58  SpO2:  [98 %] 98 %  There is no height or weight on file to calculate BMI.  General: WD/WN woman in NAD resting comfortably in bed  HEENT: PERL, anicteric sclera, moist oral mucosa  Lungs: CTA bilat  Heart: RRR  Abd: moderate diffuse tenderness, no rebound or guarding, bloody drainage in pouch  Ext: no edema  Neuro: A&O x4

## 2023-10-13 NOTE — Unmapped (Signed)
 Pt seen per a Virtual Admissions request placed by the primary RN.  Pts admission was completed with her mom at the bedside.      An education assessment and initial education were completed.   Pt advised to use the call bell if they have questions, requests or needs.  Falls education was added to their discharge AVS.        A PTA/Home Meds review is pending completion by pharmacy.  Pt has home meds at bedside which are referenced in Screenings; primary RN S. Waddell was advised of this via chat.  Pt was advised not to self-administer any home medications during this admission.   Pt notes that her chemo pill is off-formulary and due tonight.  Her med is pending verification by the chemo pharmacist; they were called at [phone] with no answer or voicemail option.     A password was declined at this time and HCDM's were clarified during the admissions questionnaire. Pt would like assistance creating an advanced directive; a Patient Relations consult was ordered to assist.     Pt's expected discharge date is currently unknown.    The most recent VS recorded for the Pt are noted below:  BP 114/64  - Pulse 81  - Temp 37 ??C (98.6 ??F) (Oral)  - Resp 17  - Ht 165.1 cm (5' 5)  - Wt 54 kg (119 lb)  - SpO2 97%  - BMI 19.80 kg/m??     The Pt did not complain of pain.  She complained of nausea; the primary RN is aware.  There were no other requests at this time.

## 2023-10-13 NOTE — Unmapped (Signed)
 Pt arrives with c/o active Gi bleed ongoing at this frequency since the weekend.  Pt endorsing weakness, dizziness and sob.  Pt reports syncope multiple times, unsure of number of times.  New onset periumbilical pain in addition to chronic lower abd pain.  Pt states she is seeing frank blood in emesis.    Mother states her port site is not completely healed even though it was placed 07/2023.   Pt having small fevers in evenings: 99-102 F, despite taking tylenol .  Pt states home pain meds are not controlling pain.

## 2023-10-14 DIAGNOSIS — D48119 Desmoid tumor: Principal | ICD-10-CM

## 2023-10-14 LAB — COMPREHENSIVE METABOLIC PANEL
ALBUMIN: 2.9 g/dL — ABNORMAL LOW (ref 3.4–5.0)
ALKALINE PHOSPHATASE: 44 U/L — ABNORMAL LOW (ref 46–116)
ALT (SGPT): <7 U/L — ABNORMAL LOW (ref 10–49)
ANION GAP: 7 mmol/L (ref 5–14)
AST (SGOT): 12 U/L (ref <=34)
BILIRUBIN TOTAL: 0.2 mg/dL — ABNORMAL LOW (ref 0.3–1.2)
BLOOD UREA NITROGEN: 6 mg/dL — ABNORMAL LOW (ref 9–23)
BUN / CREAT RATIO: 11
CALCIUM: 8.4 mg/dL — ABNORMAL LOW (ref 8.7–10.4)
CHLORIDE: 108 mmol/L — ABNORMAL HIGH (ref 98–107)
CO2: 28 mmol/L (ref 20.0–31.0)
CREATININE: 0.54 mg/dL — ABNORMAL LOW (ref 0.55–1.02)
EGFR CKD-EPI (2021) FEMALE: >90 mL/min/1.73m2 (ref >=60)
GLUCOSE RANDOM: 88 mg/dL (ref 70–179)
POTASSIUM: 3.4 mmol/L (ref 3.4–4.8)
PROTEIN TOTAL: 5.4 g/dL — ABNORMAL LOW (ref 5.7–8.2)
SODIUM: 143 mmol/L (ref 135–145)

## 2023-10-14 LAB — CBC W/ AUTO DIFF
BASOPHILS ABSOLUTE COUNT: 0 10*9/L (ref 0.0–0.1)
BASOPHILS RELATIVE PERCENT: 0.7 %
EOSINOPHILS ABSOLUTE COUNT: 0.2 10*9/L (ref 0.0–0.5)
EOSINOPHILS RELATIVE PERCENT: 3 %
HEMATOCRIT: 24.7 % — ABNORMAL LOW (ref 34.0–44.0)
HEMOGLOBIN: 8 g/dL — ABNORMAL LOW (ref 11.3–14.9)
LYMPHOCYTES ABSOLUTE COUNT: 2.1 10*9/L (ref 1.1–3.6)
LYMPHOCYTES RELATIVE PERCENT: 37.8 %
MEAN CORPUSCULAR HEMOGLOBIN CONC: 32.4 g/dL (ref 32.0–36.0)
MEAN CORPUSCULAR HEMOGLOBIN: 25 pg — ABNORMAL LOW (ref 25.9–32.4)
MEAN CORPUSCULAR VOLUME: 77.1 fL — ABNORMAL LOW (ref 77.6–95.7)
MEAN PLATELET VOLUME: 9.7 fL (ref 6.8–10.7)
MONOCYTES ABSOLUTE COUNT: 0.6 10*9/L (ref 0.3–0.8)
MONOCYTES RELATIVE PERCENT: 11.3 %
NEUTROPHILS ABSOLUTE COUNT: 2.6 10*9/L (ref 1.8–7.8)
NEUTROPHILS RELATIVE PERCENT: 47.2 %
PLATELET COUNT: 243 10*9/L (ref 150–450)
RED BLOOD CELL COUNT: 3.21 10*12/L — ABNORMAL LOW (ref 3.95–5.13)
RED CELL DISTRIBUTION WIDTH: 12.8 % (ref 12.2–15.2)
WBC ADJUSTED: 5.4 10*9/L (ref 3.6–11.2)

## 2023-10-14 MED ADMIN — HYDROmorphone (PF) (DILAUDID) injection 1 mg: 1 mg | INTRAVENOUS | @ 10:00:00 | Stop: 2023-10-27

## 2023-10-14 MED ADMIN — ondansetron (ZOFRAN) injection 4 mg: 4 mg | INTRAVENOUS | @ 14:00:00

## 2023-10-14 MED ADMIN — famotidine (PEPCID) tablet 20 mg: 20 mg | ORAL

## 2023-10-14 MED ADMIN — HYDROmorphone (PF) (DILAUDID) injection 1 mg: 1 mg | INTRAVENOUS | @ 16:00:00 | Stop: 2023-10-27

## 2023-10-14 MED ADMIN — oxyCODONE (ROXICODONE) 5 mg/5 mL solution 20 mg: 20 mg | ORAL | @ 18:00:00 | Stop: 2023-10-27

## 2023-10-14 MED ADMIN — baclofen (LIORESAL) tablet 5 mg: 5 mg | ORAL | @ 17:00:00

## 2023-10-14 MED ADMIN — oxyCODONE (ROXICODONE) 5 mg/5 mL solution 15 mg: 15 mg | ORAL | @ 05:00:00 | Stop: 2023-10-14

## 2023-10-14 MED ADMIN — HYDROmorphone (PF) (DILAUDID) injection 1 mg: 1 mg | INTRAVENOUS | @ 06:00:00 | Stop: 2023-10-27

## 2023-10-14 MED ADMIN — cetirizine (ZYRTEC) tablet 10 mg: 10 mg | ORAL

## 2023-10-14 MED ADMIN — Propofol (DIPRIVAN) injection: INTRAVENOUS | @ 21:00:00 | Stop: 2023-10-14

## 2023-10-14 MED ADMIN — oxyCODONE (ROXICODONE) 5 mg/5 mL solution 15 mg: 15 mg | ORAL | Stop: 2023-10-27

## 2023-10-14 MED ADMIN — baclofen (LIORESAL) tablet 5 mg: 5 mg | ORAL | @ 14:00:00

## 2023-10-14 MED ADMIN — acetaminophen (TYLENOL) tablet 1,000 mg: 1000 mg | ORAL | @ 05:00:00

## 2023-10-14 MED ADMIN — lactated Ringers infusion: INTRAVENOUS | @ 21:00:00 | Stop: 2023-10-14

## 2023-10-14 MED ADMIN — ondansetron (ZOFRAN) injection 4 mg: 4 mg | INTRAVENOUS | @ 08:00:00

## 2023-10-14 MED ADMIN — HYDROmorphone (PF) (DILAUDID) injection 1 mg: 1 mg | INTRAVENOUS | @ 22:00:00 | Stop: 2023-10-27

## 2023-10-14 MED ADMIN — oxyCODONE (ROXICODONE) 5 mg/5 mL solution 15 mg: 15 mg | ORAL | @ 14:00:00 | Stop: 2023-10-14

## 2023-10-14 MED ADMIN — ondansetron (ZOFRAN) injection 4 mg: 4 mg | INTRAVENOUS | @ 02:00:00 | Stop: 2023-10-13

## 2023-10-14 MED ADMIN — cetirizine (ZYRTEC) tablet 10 mg: 10 mg | ORAL | @ 14:00:00

## 2023-10-14 MED ADMIN — lactated Ringers infusion: 75 mL/h | INTRAVENOUS | @ 14:00:00 | Stop: 2023-10-14

## 2023-10-14 MED ADMIN — pantoprazole (Protonix) injection 40 mg: 40 mg | INTRAVENOUS

## 2023-10-14 MED ADMIN — phenylephrine 1 mg/10 mL (100 mcg/mL) injection Syrg: INTRAVENOUS | @ 21:00:00 | Stop: 2023-10-14

## 2023-10-14 MED ADMIN — pantoprazole (Protonix) injection 40 mg: 40 mg | INTRAVENOUS | @ 14:00:00

## 2023-10-14 MED ADMIN — buprenorphine 20 mcg/hour transdermal patch 1 patch: 1 | TRANSDERMAL | @ 18:00:00

## 2023-10-14 MED ADMIN — HYDROmorphone (PF) (DILAUDID) injection 1 mg: 1 mg | INTRAVENOUS | @ 02:00:00 | Stop: 2023-10-27

## 2023-10-14 MED ADMIN — oxyCODONE (ROXICODONE) 5 mg/5 mL solution 20 mg: 20 mg | ORAL | @ 22:00:00 | Stop: 2023-10-14

## 2023-10-14 MED ADMIN — lactated Ringers infusion: 75 mL/h | INTRAVENOUS

## 2023-10-14 MED ADMIN — midazolam (VERSED) injection: INTRAVENOUS | @ 21:00:00 | Stop: 2023-10-14

## 2023-10-14 MED ADMIN — meperidine (DEMEROL) injection: INTRAVENOUS | @ 21:00:00 | Stop: 2023-10-14

## 2023-10-14 MED ADMIN — escitalopram oxalate (LEXAPRO) tablet 5 mg: 5 mg | ORAL | @ 14:00:00

## 2023-10-14 NOTE — Unmapped (Signed)
 [facility]   Initial Consultation         Assessment and Recommendations:   [name] [name] is a 23 y.o. 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 The patient is seen in consultation at the request of [name] Idell [name], MD (Med [facility])) for bleeding.    Assessment: 23 year old woman well-known to our service here with GI bleeding.  She does have chronic iron  deficiency anemia from intermittent oozing from superficial ulcerations in her ilial anal pouch.  However, we have evaluated her multiple times with a pouchoscopy and have not found anything to intervene upon.  Since she is having overt bleeding with bright red blood per rectum, we can perform pouch exam with argon plasma coagulation of pouch ulcers if present.  We did not significantly discuss her abdominal pain or her decreased p.o. intake during our visit.  Regarding her bleeding hand pain, she is planning on seeing [facility] for double-balloon enteroscopy to ensure she has no more proximal source of bleeding which is reasonable.      Recommendations:  - Proceed with pouchoscopy today  - Keep n.p.o.    GI Pre-Procedure Checklist  Procedure: Pouchoscopy  Anticipated Date of Procedure: 8/6  Anticoagulants/Antiplatelets: Not Applicable  Anesthesia Concerns: None  Diet: Keep patient NPO  Prep: Administer two tap water enemas 30 minutes apart    Issues Impacting Complexity of Management:  -Discussed this patient's care with Dr. [name] from the primary service as summarized in this note    Recommendations discussed with the patient's primary team. We will continue to follow along with you.    Subjective:   This is a 23 year old woman who is well-known to the gastroenterology service.  She presents with blood per rectum.    The patient has been hospitalized multiple times in the past year and seen by our GI service.    She was seen in December 2024 for gastrointestinal bleeding where her pouch exam showed minimal inflammation and several superficial ulcerations suggestive of healing pouchitis.  Started on cefdinir .  Then seen again by her GI service 1/17 for poor p.o. intake.  She requested TPN but there is no indication for this.    Seen again the following month 04/28/2023 for acute on chronic diarrhea, bloating and nausea.  Treated supportively.  No endoscopic evaluations pursued.    Then seen again in April with pain with defecation and blood per rectum.  Underwent pouchoscopy in the hospital which showed patchy inflammation very mild in severity with a few aphthous ulcerations.  She was started on budesonide .    Underwent MRE recently which showed 2 enhancing soft tissue lesions in the right rectus sheath musculature and similar-appearing ill-defined enhancing soft tissue lesion in the central lower mesentery.  Seen again May 7 for bright red blood per rectum.  Any further endoscopic evaluation was deferred and capsule endoscopy was not recommended due to risk of bowel obstruction.    Since then, she has an appointment to see [facility] for double-balloon enteroscopy on August 20.    Now back with persistent bleeding per rectum.  Hemoglobin 9.7 on arrival around her baseline.  Downtrending to 8.0 this morning.  TSAT 3%.  CT abdomen pelvis with IV contrast with mild inflammatory stranding suggestive of pouchitis with other chronic findings.    -I have reviewed the patient's prior records  from prior hospitalizations as summarized in the HPI    Objective:   Temp:  [36.7 ??C (98.1 ??F)-37.6 ??C (99.6 ??F)] 36.9 ??C (98.4 ??F)  Pulse:  [81-98] 89  SpO2 Pulse:  [72-77] 72  Resp:  [17-18] 17  BP: (97-124)/(52-72) 115/52  SpO2:  [97 %-98 %] 98 %    Gen: WDWN female in NAD, answers questions appropriately  Abdomen: Soft, NTND, no rebound/guarding, no hepatosplenomegaly  Extremities: No edema in the BLEs    Pertinent Labs & Studies:  -I have reviewed the patient's labs from 10/14/23 which show down-trending Hgb

## 2023-10-14 NOTE — Unmapped (Signed)
 Hospitalist Daily Progress Note     LOS: 1 day     Assessment/Plan:  Principal Problem:    GI bleed  Active Problems:    [name] (familial adenomatous polyposis)    Intestinal polyps    History of colonic polyps    Syncope    History of colectomy    Lower abdominal pain       [name] [name] is a 23 y.o. female who is presenting to [facility] with GI bleed, in the setting of the following pertinent/contributing co-morbidities: familial polyposis, s/p colectomy, severe Fe deficiency        GI bleed   Illness that poses a threat to life or bodily function without appropriate treatment     Secondary/Additional Active Problems:  Assessment & Plan    GI bleed  Patient with h/o familial polyposis and desmoid tumors and s/p colectomy who presents with several days of BRB in pouch. Hgb stable and patient is hemodynamically stable  -Gi consulted and NPO for now  -  continue IV PPI  - Follow-up Hgb tonight and in the AM     Profound Fe-Deficiency Anemia  - Iron  =5, Transferrin = 148, TIBC =186.5, Fe sat= 3  - Will likely benefit from IV Fe and she has had several in past that have been complicated by allergic reaction requiring pre-meds (zyrtec , famotidine  and prednisone ) prior to administration   [name] (familial adenomatous polyposis)  - s/p colectomy  Syncope  - likely secondary to vasovagal   - hgb is stable and patient is hemodynamically stable  - patient current asymptomatic     Lower abdominal pain  - attempting to talk to her outpt pain provider  - continue butrans  patch 20 mg weekly (ordered today), oxycodone  20 mg po TID PRN, Baclofen  5 mg po tID, Voltaren  gel and Lidocaine  patch.    Desmoid Tumors - involving paraspinal, chest wall, right arm/wrist  - s/p cryoablation with some clinical benefit  - continue nirogacestat  for now and initiate oncology consult to help with ordering medication     Prophylaxis  None     Diet  -Nutrition Therapy Clear Liquid  NPO Sips with meds; Procedure/Test     Code Status / HCDM   -Full Code, Discussed with patient at the time of admission   -  HCDM [facility] policy, no known patient preference): Carrozza,[name] - Mother - [phone]    HCDM, back-up (If primary HCDM is unavailable): Merlean, Pizzini - Father - [phone]           *Code Status / HCDM  -Full Code, Discussed with patient at the time of admission   -  HCDM (patient stated preference): Falor,[name] - Mother - [phone]    HCDM, back-up (If primary HCDM is unavailable): Kristee, Angus - Father - [phone]    HCDM, back-up (If primary HCDM is unavailable): Amandalee, Lacap - Sister - [phone]    Significant Comorbid Conditions:  {This chart/labs will disappear, use them to document electrolyte abnormalities      I personally spent 53 minutes face-to-face and non-face-to-face in the care of this patient, which includes all pre, intra, and post visit time on the date of service.  All documented time was specific to the E/M visit and does not include any procedures that may have been performed.      Consultants:   1.  Onc  2. GI    Pending labs:       Subjective:       Pt reports some increased  abdominal pain and has noticed some more bleeding from her ostomy over the last few weeks. She is also asking about her iron  infusion and/or blood transfusion. Trying to arrange this all around her timing of an MRI.    Objective:     Vital signs in last 24 hours:  Temp:  [36.7 ??C (98.1 ??F)-37.6 ??C (99.6 ??F)] 36.9 ??C (98.4 ??F)  Pulse:  [81-98] 89  SpO2 Pulse:  [72-77] 72  Resp:  [17-18] 17  BP: (97-124)/(52-72) 115/52  MAP (mmHg):  [66-79] 70  SpO2:  [97 %-98 %] 98 %  BMI (Calculated):  [19.8] 19.8    Physical Exam:  General: sitting up in bed,generalized pallor, in good spirits  Eyes:anicteric sclerae, no conjunctival irritation  ENT: MMM  Neuro:  EOMI, no facial asymmetry  CV: regular rate and rhythm, normal S1, S2, no murmurs  Pulmonary: lungs are clear, no rales, ronchi, or wheezing  Abdomen: soft, tender to palpation, +BS  Skin: no lesions, rashes, butrans  patch on her back  Extremities: no c/c/e  Psych: awake, alert, oriented      Intake/Output last 24 hours:  No intake or output data in the 24 hours ending 10/14/23 0817    Medications:   Scheduled Meds:Scheduled Medications[1]  Continuous Infusions:Infusions Meds[2]    Lab Results   Component Value Date    WBC 5.4 10/14/2023    HGB 8.0 (L) 10/14/2023    HCT 24.7 (L) 10/14/2023    PLT 243 10/14/2023       Lab Results   Component Value Date    NA 143 10/14/2023    K 3.4 10/14/2023    CL 108 (H) 10/14/2023    CO2 28.0 10/14/2023    BUN 6 (L) 10/14/2023    CREATININE 0.54 (L) 10/14/2023    GLU 88 10/14/2023    CALCIUM 8.4 (L) 10/14/2023    MG 2.1 10/06/2023    PHOS 3.9 07/19/2023       Lab Results   Component Value Date    BILITOT 0.2 (L) 10/14/2023    BILIDIR <0.10 03/17/2023    PROT 5.4 (L) 10/14/2023    ALBUMIN 2.9 (L) 10/14/2023    ALT <7 (L) 10/14/2023    AST 12 10/14/2023    ALKPHOS 44 (L) 10/14/2023    GGT 13 10/31/2011       Lab Results   Component Value Date    INR 0.99 04/04/2023    APTT 33.7 03/25/2023             [1]    cetirizine   10 mg Oral BID    escitalopram  oxalate  5 mg Oral Daily    famotidine   20 mg Oral Nightly    nirogacestat   100 mg Oral BID    pantoprazole  (Protonix ) intravenous solution  40 mg Intravenous BID   [2]    lactated Ringers  75 mL/hr (10/13/23 2027)

## 2023-10-14 NOTE — Unmapped (Signed)
 Oncology Consult Note    Requesting Attending Physician:  [name], MD  Service Requesting Consult: [facility] [place] [facility])  Primary Oncologist: Dr. Loris     Reason for Consult: medication ordering     Assessment: Quaniyah Elaine Defreitas is a 23 y.o. female with pmh of desmoid fibrosis, history of [name], presenting to the ED with GI bleeding. The patient has a history of familial polyposis and colectomy. Over the last several days she has had increased bleeding into her pouch. She has had increasing weakness and multiple episodes of syncope. In the past this has been associated with profound anemia. 09/16/22: Start nirogacestat .  Oncology service consulted largely for management of nirogacestat .     Recommendations:   -Continue to hold nirogacestat   - If patient is to need intravenous iron  therapy benign hematology should be consulted  - GI bleed management per primary service    This patient has been seen and discussed with Dr. Steffanie. These recommendations were discussed with the primary team.     Please contact the oncology team at [phone] with any further questions.    Sherlean Dare, DO  Hematology Oncology Fellow PGY-4    Personal Pager [phone]  Hematology/Oncology Transfer Request: [id number]  Hem/Onc Outpatient call: [phone]  Coagulation/Benign Hematology: [id number]  Malignant Hematology: [id number]   BMT Fellow: [id number]  Oncology/Solid Tumor: [id number]   -------------------------------------------------------------    HPI: Lucky Alverson is a 23 y.o. woman with a past medical history of [name], multiple intra-abdominal desmoid tumors on nirogacestat  and iron  deficiency anemia.  She presents to [facility] after multiple weeks of avoiding coming to the hospital due to increased abdominal pain and increasing syncopal episodes.  We spoke with the patient and her mother at bedside who endorsed general symptoms over the last few weeks of increasing fatigue, random syncopal episodes and increasing abdominal pain that is different from her typical desmoid tumor pain.  The patient describes 3 distinct areas of pain in her abdomen to that are over the bilateral rib cage that feel differently than her centralized abdominal pain that radiates to the back.  Her primary symptom is this pain and is associated with decreased appetite, nausea and blood per rectum.    Oncology history   09/16/22: Start nirogacestat .      09/25/22: Admitted with uncontrolled pain and muscle spasms after IR guided procedure. Prolonged hospital stay, neurologic evaluation, managed with muscle relaxers (baclofen , valium ). Discharge 9/11 to rehab. Ramped up on nirogacestat  with some side effects, discharged from rehab 10/2.     03/25/23 - 05/07/23: Prolonged, complex admission for pain, malnutrition. Found to have strep mitis blood culture. NG tube placed, TPN not recommended. Discharged to rehab, but then readmitted with C Diff, treated.     06/26/23: Scans with largely stable disease, although growing abdominal wall tumor.     4/26-5/11: Admitted with abdominal pain, hematochezia. She underwent iron  infusion and EGD, discharged on budesonide . Syncope felt most consistent with POTS.     07/31/23: Nirogacestat  up to 100mg  BID. Budesonide  for GI inflammation. Cards for POTS eval.      09/01/23: Recent abdominal pain led to RUQ US  - mild bil dil but no other findings. Bili not elevated. Cards does not think she meets criteria for POTS. Scans in 1 month.     09/29/23: Scans overall stable; possible enlargement of lesion posterior to right forth rib. Continues on nirogacestat  100 mg BID.        Review of Systems: All other  systems reviewed and negative except as per HPI    Past Medical History[1]    Past Surgical History[2]    Family History[3]    Social History [4]    Allergies: is allergic to adhesive tape-silicones; ferrlecit  [sodium ferric gluconat-sucrose]; levofloxacin ; methylnaltrexone ; neomycin; papaya; morphine; zosyn [piperacillin-tazobactam]; adhesive; compazine [prochlorperazine]; iron  analogues; metoclopramide ; reglan  [metoclopramide  hcl]; iron  dextran; and latex, natural rubber.    Medications:   Meds:Scheduled Medications[5]  Continuous Infusions:Infusions Meds[6]  PRN Meds:.PRN Medications[7]    Objective:   Vitals: Temp:  [36.7 ??C (98.1 ??F)-37.6 ??C (99.6 ??F)] 36.9 ??C (98.4 ??F)  Pulse:  [81-98] 89  SpO2 Pulse:  [72-77] 72  Resp:  [17-18] 17  BP: (97-124)/(52-72) 115/52  MAP (mmHg):  [66-79] 70  SpO2:  [97 %-98 %] 98 %  BMI (Calculated):  [19.8] 19.8    Physical Exam:  BP 115/52  - Pulse 89  - Temp 36.9 ??C (98.4 ??F) (Oral)  - Resp 17  - Ht 165.1 cm (5' 5)  - Wt 54 kg (119 lb)  - SpO2 98%  - BMI 19.80 kg/m??    General appearance -she is generally well-appearing lying comfortably in bed in no acute distress  Mental status - alert, oriented to person, place, and time   Eyes - pupils equal and reactive, extraocular eye movements intact   Nose - normal and patent, no erythema, discharge   Mouth - mucous membranes moist, pharynx normal without lesions   Neck - supple no JVD and trachea is midline    Pulmonary -clear to auscultation bilaterally  Cardiovascular-normal S1 and S2 no murmurs auscultated  Gastrointestinal -patient has generalized abdominal tenderness over her entire anterior abdomen.  She describes 3 distinct areas of pain 1 in the middle of her abdomen just superior to the umbilicus that she states is dull and radiates to the back and to that flank this area in the right upper and left upper quadrants that feel like her desmoid tumor pain that she has had in the past  Neurological -muscle strength is equal in the bilateral upper and lower extremities she is moving extremities spontaneously and to command cranial nerves are grossly intact          Test Results  Lab Results   Component Value Date    WBC 5.4 10/14/2023    HGB 8.0 (L) 10/14/2023    HCT 24.7 (L) 10/14/2023    PLT 243 10/14/2023       Lab Results   Component Value Date    NA 143 10/14/2023 K 3.4 10/14/2023    CL 108 (H) 10/14/2023    CO2 28.0 10/14/2023    BUN 6 (L) 10/14/2023    CREATININE 0.54 (L) 10/14/2023    GLU 88 10/14/2023    CALCIUM 8.4 (L) 10/14/2023    MG 2.1 10/06/2023    PHOS 3.9 07/19/2023       Lab Results   Component Value Date    BILITOT 0.2 (L) 10/14/2023    BILIDIR <0.10 03/17/2023    PROT 5.4 (L) 10/14/2023    ALBUMIN 2.9 (L) 10/14/2023    ALT <7 (L) 10/14/2023    AST 12 10/14/2023    ALKPHOS 44 (L) 10/14/2023    GGT 13 10/31/2011       Lab Results   Component Value Date    INR 0.99 04/04/2023    APTT 33.7 03/25/2023         Imaging: Radiology studies were personally reviewed                      [  1]   Past Medical History:  Diagnosis Date    Abdominal pain     Acid reflux     occas    Anesthesia complication     itching, shaking, coldness; last few surgeries have gone much better    Cancer        Cataract of right eye     COVID-19 virus infection 01/2019    Cyst of thyroid determined by ultrasound     monitoring    [name]     2 right forearm, 1 left thigh, 1 right scapula, 1 under left clavicle; multiple    Difficult intravenous access     [name] (familial adenomatous polyposis)     [name] (HHS-HCC)     Gastric polyps     History of chemotherapy     last treatment approx 05/2019    History of colon polyps     History of COVID-19 01/2019    Ileus    03/16/2022    Iron  deficiency anemia due to chronic blood loss     received iron  infusion 11-2019    PONV (postoperative nausea and vomiting)     Rectal bleeding     Syncopal episodes     especially if becoming dehydrated   [2]   Past Surgical History:  Procedure Laterality Date    COLON SURGERY      cyroablation      cystis removal      desmoid removal      IR INSERT PORT AGE GREATER THAN 5 YRS  07/20/2023    IR INSERT PORT AGE GREATER THAN 5 YRS 07/20/2023 Guilford Hicks, MD IMG VIR H&V [facility]    PR CLOSE ENTEROSTOMY,RESEC+ANAST N/A 10/09/2020    Procedure: ILEOSTOMY TAKEDOWN;  Surgeon: Evalene Karalee Shilling, MD;  Location: OR [place];  Service: General Surgery    PR COLONOSCOPY W/BIOPSY SINGLE/MULTIPLE N/A 10/27/2012    Procedure: COLONOSCOPY, FLEXIBLE, PROXIMAL TO SPLENIC FLEXURE; WITH BIOPSY, SINGLE OR MULTIPLE;  Surgeon: Annalee LABOR Mir, MD;  Location: PEDS PROCEDURE ROOM [facility];  Service: Gastroenterology    PR COLONOSCOPY W/BIOPSY SINGLE/MULTIPLE N/A 09/14/2013    Procedure: COLONOSCOPY, FLEXIBLE, PROXIMAL TO SPLENIC FLEXURE; WITH BIOPSY, SINGLE OR MULTIPLE;  Surgeon: Annalee LABOR Mir, MD;  Location: PEDS PROCEDURE ROOM [place];  Service: Gastroenterology    PR COLONOSCOPY W/BIOPSY SINGLE/MULTIPLE N/A 11/08/2014    Procedure: COLONOSCOPY, FLEXIBLE, PROXIMAL TO SPLENIC FLEXURE; WITH BIOPSY, SINGLE OR MULTIPLE;  Surgeon: Annalee Dine Mir, MD;  Location: PEDS PROCEDURE ROOM [facility];  Service: Gastroenterology    PR COLONOSCOPY W/BIOPSY SINGLE/MULTIPLE N/A 12/26/2015    Procedure: COLONOSCOPY, FLEXIBLE, PROXIMAL TO SPLENIC FLEXURE; WITH BIOPSY, SINGLE OR MULTIPLE;  Surgeon: Annalee Dine Mir, MD;  Location: PEDS PROCEDURE ROOM [facility];  Service: Gastroenterology    PR COLONOSCOPY W/BIOPSY SINGLE/MULTIPLE N/A 09/02/2017    Procedure: COLONOSCOPY, FLEXIBLE, PROXIMAL TO SPLENIC FLEXURE; WITH BIOPSY, SINGLE OR MULTIPLE;  Surgeon: Annalee Dine Mir, MD;  Location: PEDS PROCEDURE ROOM [place];  Service: Gastroenterology    PR COLSC FLX W/REMOVAL LESION BY HOT BX FORCEPS N/A 08/27/2016    Procedure: COLONOSCOPY, FLEXIBLE, PROXIMAL TO SPLENIC FLEXURE; W/REMOVAL TUMOR/POLYP/OTHER LESION, HOT BX FORCEP/CAUTE;  Surgeon: Annalee Dine Mir, MD;  Location: PEDS PROCEDURE ROOM [facility];  Service: Gastroenterology    PR COLSC FLX W/RMVL OF TUMOR POLYP LESION SNARE TQ N/A 02/25/2019    Procedure: COLONOSCOPY FLEX; W/REMOV TUMOR/LES BY SNARE;  Surgeon: Lamar Jayson Ards, MD;  Location: [facility] PROCEDURES [place] [facility];  Service: Gastroenterology    PR COLSC  FLX W/RMVL OF TUMOR POLYP LESION SNARE TQ N/A 03/13/2020    Procedure: COLONOSCOPY FLEX; W/REMOV TUMOR/LES BY SNARE;  Surgeon: Lamar Jayson Ards, MD;  Location: [facility] PROCEDURES [place] [facility];  Service: Gastroenterology    PR ENDOSCOPY UPPER SMALL INTESTINE N/A 07/15/2023    Procedure: SMALL INTESTINAL ENDOSCOPY, ENTEROSCOPY BEYOND SECOND PORTION OF DUODENUM, NOT INCL ILEUM; DX, INCL COLLECTION OF SPECIMEN(S) BY BRUSHING OR WASHING, WHEN PERFORMED;  Surgeon: Charlanne Kipper, MD;  Location: GI PROCEDURES [facility];  Service: Gastrointestinal    PR EXC SKIN BENIG 2.1-3 CM TRUNK,ARM,LEG Right 02/25/2017    Procedure: EXCISION, BENIGN LESION INCLUDE MARGINS, EXCEPT SKIN TAG, LEGS; EXCISED DIAMETER 2.1 TO 3.0 CM;  Surgeon: Jeyhan Suzan Wood, MD;  Location: CHILDRENS OR [facility];  Service: Plastics    PR EXC SKIN BENIG 3.1-4 CM TRUNK,ARM,LEG Right 02/25/2017    Procedure: EXCISION, BENIGN LESION INCLUDE MARGINS, EXCEPT SKIN TAG, ARMS; EXCISED DIAMETER 3.1 TO 4.0 CM;  Surgeon: Jeyhan Suzan Wood, MD;  Location: CHILDRENS OR [facility];  Service: Plastics    PR EXC SKIN BENIG >4 CM FACE,FACIAL Right 02/25/2017    Procedure: EXCISION, OTHER BENIGN LES INCLUD MARGIN, FACE/EARS/EYELIDS/NOSE/LIPS/MUCOUS MEMBRANE; EXCISED DIAM >4.0 CM;  Surgeon: Jeyhan Suzan Wood, MD;  Location: CHILDRENS OR [facility];  Service: Plastics    PR EXC TUMOR SOFT TISSUE LEG/ANKLE SUBQ 3+CM Right 08/05/2019    Procedure: EXCISION, TUMOR, SOFT TISSUE OF LEG OR ANKLE AREA, SUBCUTANEOUS; 3 CM OR GREATER;  Surgeon: Delon Charmaine Myron, MD;  Location: MAIN OR [place];  Service: Plastics    PR EXC TUMOR SOFT TISSUE LEG/ANKLE SUBQ <3CM Right 08/05/2019    Procedure: EXCISION, TUMOR, SOFT TISSUE OF LEG OR ANKLE AREA, SUBCUTANEOUS; LESS THAN 3 CM;  Surgeon: Delon Charmaine Myron, MD;  Location: MAIN OR [facility];  Service: Plastics    PR LAP, SURG PROCTECTOMY W J-POUCH N/A 08/10/2020    Procedure: ROBOTIC ASSISTED LAPAROSCOPIC PROCTOCOLECTOMY, ILEAL J POUCH, WITH OSTOMY;  Surgeon: Evalene Karalee Shilling, MD;  Location: OR [place];  Service: General Surgery    PR NDSC EVAL INTSTINAL POUCH DX W/COLLJ SPEC SPX N/A 01/23/2021    Procedure: ENDO EVAL SM INTEST POUCH; DX;  Surgeon: Mikle Cleora Martinez, MD;  Location: [facility] PROCEDURES [place] [facility];  Service: Gastroenterology    PR NDSC EVAL INTSTINAL POUCH DX W/COLLJ SPEC SPX N/A 08/27/2021    Procedure: ENDO EVAL SM INTEST POUCH; DX;  Surgeon: Rodgers JAYSON Minor, MD;  Location: GI PROCEDURES [facility];  Service: Gastroenterology    PR NDSC EVAL INTSTINAL POUCH DX W/COLLJ SPEC SPX N/A 12/09/2021    Procedure: ENDO EVAL SM INTEST POUCH; DX;  Surgeon: Sherrill Liliane Olmsted, MD;  Location: GI PROCEDURES [facility];  Service: Gastroenterology    PR NDSC EVAL INTSTINAL POUCH DX W/COLLJ [facility] SPX Left 04/09/2022    Procedure: ENDO EVAL SM INTEST POUCH; DX;  Surgeon: Martinez Mikle Cleora, MD;  Location: [facility] [place] [facility];  Service: Gastroenterology    PR NDSC EVAL INTSTINAL POUCH DX W/COLLJ SPEC SPX N/A 08/05/2022    Procedure: ENDO EVAL SM INTEST POUCH; DX;  Surgeon: Martinez Mikle Cleora, MD;  Location: GI PROCEDURES [facility];  Service: Gastroenterology    PR NDSC EVAL INTSTINAL POUCH DX W/COLLJ SPEC SPX N/A 03/13/2023    Procedure: ENDO EVAL SM INTEST POUCH; DX;  Surgeon: Rogerio Lauraine Sor, MD;  Location: GI PROCEDURES [facility];  Service: Gastroenterology    PR NDSC EVAL INTSTINAL POUCH DX W/COLLJ SPEC SPX N/A 07/06/2023    Procedure: ENDO EVAL  SM INTEST POUCH; DX;  Surgeon: Junette Fonda CROME, MD;  Location: GI PROCEDURES [facility];  Service: Gastroenterology    PR NDSC EVAL INTSTINAL POUCH W/BX SINGLE/MULTIPLE N/A 01/20/2022    Procedure: ENDOSCOPIC EVAL OF SMALL INTESTINAL POUCH; DIAGNOSTIC, No biopsies;  Surgeon: Gwenn Dallas Ruth, MD;  Location: GI PROCEDURES [facility];  Service: Gastroenterology    PR NDSC EVAL INTSTINAL POUCH W/BX SINGLE/MULTIPLE N/A 02/13/2022    Procedure: ENDOSCOPIC EVAL OF SMALL INTESTINAL POUCH; DIAGNOSTIC, WITH BIOPSY;  Surgeon: Georgine Arlean Min, MD;  Location: GI PROCEDURES [facility];  Service: Gastroenterology    PR NDSC EVAL INTSTINAL POUCH W/BX SINGLE/MULTIPLE N/A 03/13/2023    Procedure: ENDOSCOPIC EVAL OF SMALL INTESTINAL POUCH; DIAGNOSTIC, WITH BIOPSY;  Surgeon: Rogerio Lauraine Sor, MD;  Location: GI PROCEDURES [facility];  Service: Gastroenterology    PR UNLISTED PROCEDURE SMALL INTESTINE  01/23/2021    Procedure: UNLISTED PROCEDURE, SMALL INTESTINE;  Surgeon: Mikle Cleora Martinez, MD;  Location: [facility] PROCEDURES [place] [facility];  Service: Gastroenterology    PR UNLISTED PROCEDURE SMALL INTESTINE  02/13/2022    Procedure: UNLISTED PROCEDURE, SMALL INTESTINE;  Surgeon: Georgine Arlean Min, MD;  Location: GI PROCEDURES [facility];  Service: Gastroenterology    PR UPPER GI ENDOSCOPY,BIOPSY N/A 10/27/2012    Procedure: UGI ENDOSCOPY; WITH BIOPSY, SINGLE OR MULTIPLE;  Surgeon: Annalee LABOR Mir, MD;  Location: PEDS PROCEDURE ROOM [facility];  Service: Gastroenterology    PR UPPER GI ENDOSCOPY,BIOPSY N/A 09/14/2013    Procedure: UGI ENDOSCOPY; WITH BIOPSY, SINGLE OR MULTIPLE;  Surgeon: Annalee LABOR Mir, MD;  Location: PEDS PROCEDURE ROOM [facility];  Service: Gastroenterology    PR UPPER GI ENDOSCOPY,BIOPSY N/A 11/08/2014    Procedure: UGI ENDOSCOPY; WITH BIOPSY, SINGLE OR MULTIPLE;  Surgeon: Annalee Dine Mir, MD;  Location: PEDS PROCEDURE ROOM [facility];  Service: Gastroenterology    PR UPPER GI ENDOSCOPY,BIOPSY N/A 12/26/2015    Procedure: UGI ENDOSCOPY; WITH BIOPSY, SINGLE OR MULTIPLE;  Surgeon: Annalee Dine Mir, MD;  Location: PEDS PROCEDURE ROOM [facility];  Service: Gastroenterology    PR UPPER GI ENDOSCOPY,BIOPSY N/A 08/27/2016    Procedure: UGI ENDOSCOPY; WITH BIOPSY, SINGLE OR MULTIPLE;  Surgeon: Annalee Dine Mir, MD;  Location: PEDS PROCEDURE ROOM [facility];  Service: Gastroenterology    PR UPPER GI ENDOSCOPY,BIOPSY N/A 09/02/2017    Procedure: UGI ENDOSCOPY; WITH BIOPSY, SINGLE OR MULTIPLE;  Surgeon: Annalee Dine Mir, MD;  Location: PEDS PROCEDURE ROOM [facility];  Service: Gastroenterology    PR UPPER GI ENDOSCOPY,BIOPSY N/A 03/13/2020    Procedure: UGI ENDOSCOPY; WITH BIOPSY, SINGLE OR MULTIPLE;  Surgeon: Lamar Jayson Ards, MD;  Location: [place] PROCEDURES [place] [facility];  Service: Gastroenterology    PR UPPER GI ENDOSCOPY,BIOPSY N/A 09/05/2021    Procedure: UGI ENDOSCOPY; WITH BIOPSY, SINGLE OR MULTIPLE;  Surgeon: Rosamond Never, MD;  Location: GI PROCEDURES [facility];  Service: Gastroenterology    PR UPPER GI ENDOSCOPY,DIAGNOSIS N/A 01/20/2022    Procedure: UGI ENDO, INCLUDE ESOPHAGUS, STOMACH, & DUODENUM &/OR JEJUNUM; DX W/WO COLLECTION SPECIMN, BY BRUSH OR WASH;  Surgeon: Gwenn Dallas Ruth, MD;  Location: GI PROCEDURES [facility];  Service: Gastroenterology    TUMOR REMOVAL      multiple-head, neck, back, hand, right flank, multiple   [3]   Family History  Problem Relation Age of Onset    No Known Problems Mother     No Known Problems Father     No Known Problems Sister     No Known Problems Brother     Stroke Maternal Grandmother  Other Maternal Grandmother         benign lesions of liver and pancreas, further details unknown    Cancer Maternal Grandmother     Diabetes Maternal Grandmother     Hypertension Maternal Grandmother     Thyroid disease Maternal Grandmother     Arthritis Maternal Grandfather     Asthma Maternal Grandfather     COPD Paternal Grandmother         Deceased    Miscarriages / Stillbirths Paternal Grandmother     Alcohol abuse Paternal Grandfather         Deceased    No Known Problems Maternal Aunt     No Known Problems Maternal Uncle     No Known Problems Paternal Aunt     No Known Problems Paternal Uncle     Anesthesia problems Neg Hx     Broken bones Neg Hx     Cancer Neg Hx     Clotting disorder Neg Hx     Collagen disease Neg Hx     Diabetes Neg Hx     Dislocations Neg Hx     Fibromyalgia Neg Hx     Gout Neg Hx     Hemophilia Neg Hx     Osteoporosis Neg Hx     Rheumatologic disease Neg Hx     Scoliosis Neg Hx     Severe sprains Neg Hx     Sickle cell anemia Neg Hx     Spinal Compression Fracture Neg Hx     Melanoma Neg Hx     Basal cell carcinoma Neg Hx     Squamous cell carcinoma Neg Hx    [4]   Social History  Socioeconomic History    Marital status: Single    Number of children: 0    Years of education: 15    Highest education level: Some college, no degree   Tobacco Use    Smoking status: Never     Passive exposure: Past    Smokeless tobacco: Never   Vaping Use    Vaping status: Never Used   Substance and Sexual Activity    Alcohol use: Never    Drug use: Never    Sexual activity: Never   Other Topics Concern    Do you use sunscreen? Yes    Tanning bed use? No    Are you easily burned? No    Excessive sun exposure? No    Blistering sunburns? 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     Social Drivers of Psychologist, prison and probation services Strain: Low Risk  (03/10/2023)    Overall Financial Resource Strain (CARDIA)     Difficulty of Paying Living Expenses: Not very hard   Food Insecurity: No Food Insecurity (03/10/2023)    Hunger Vital Sign     Worried About Running Out of Food in the Last Year: Never true     Ran Out of Food in the Last Year: Never true   Transportation Needs: No Transportation Needs (05/01/2023)    PRAPARE - Therapist, art (Medical): No     Lack of Transportation (Non-Medical): No   Housing: Unknown (09/09/2023)    Received from [facility]    Housing Stability Vital Sign     At any time in the past 12 months, were  you homeless or living in a shelter (including now)?: No   [5]    baclofen   5 mg Oral TID    cetirizine   10 mg Oral BID    escitalopram  oxalate  5 mg Oral Daily    famotidine   20 mg Oral Nightly    pantoprazole  (Protonix ) intravenous solution  40 mg Intravenous BID   [6]    lactated Ringers  75 mL/hr (10/14/23 1009)   [7] acetaminophen , butalbital -acetaminophen -caffeine , fluticasone  propionate, HYDROmorphone , LORazepam , naloxegol , naloxone , ondansetron , oxyCODONE 

## 2023-10-14 NOTE — Unmapped (Signed)
 Shift Summary  Pain management was a focus, with multiple administrations of pain relief  medications throughout the shift.    Aseptic techniques and hand hygiene were consistently maintained to prevent hospital-acquired infections.    Abdominal pain decreased initially but increased slightly later in the shift, requiring additional pain management.    Ondansetron  was administered for nausea towards the end of the shift.    Overall, the patient's comfort and infection prevention were actively managed during the shift.     Absence of Hospital-Acquired Illness or Injury: Aseptic technique was maintained, and hand hygiene was promoted throughout the shift. No new symptoms of infection were documented.     Optimal Comfort and Wellbeing: Abdominal pain decreased from a 9 to a 7 after administration of acetaminophen  and oxyCODONE . Pain increased slightly to 8 later in the shift, and HYDROmorphone  was administered.

## 2023-10-14 NOTE — Unmapped (Signed)
 Therapist Contact Note: [name] [name] was in a procedure, so I will check back in with her tomorrow for a session!    Thank you for consulting me about this patient. Please reach out on Epic chat if they request a session. I hope to be able to support them during their hospital stay!

## 2023-10-14 NOTE — Unmapped (Signed)
 [facility] Consult Service   Treatment Plan         Assessment and Recommendations:   [name] [name] is a 23 y.o. 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 The patient is seen in consultation at the request of [name], MD (Med [place] [facility] [place])) for bleeding.    Assessment: Pouch exam completed. Superficial ulceration around her anastomosis. Treated with APC. Probably responsible for BRBRP. Observe clinical course. Monitor blood counts daily.    Issues Impacting Complexity of Management:  -Discussed this patient's care with Dr. [name] from the primary service as summarized in this note    Recommendations discussed with the patient's primary team.

## 2023-10-14 NOTE — Unmapped (Signed)
 Shift Summary  Pain increased in the abdomen from 7 to 9 on the pain scale despite pain management interventions.    Lactated Ringers  infusion was stopped in the 7 BT [facility] department.    Multiple medications, including Propofol  and meperidine , were administered in the GI PROCEDURE [facility] PRN department.    Side rails were maintained at 3/4, and hourly checks confirmed the patient was awake and in bed.    The patient's overall status remains focused on managing abdominal pain and preventing falls.     Absence of Fall and Fall-Related Injury: Side rails were consistently kept at 3/4 throughout the shift, and hourly visual checks confirmed the patient was in bed and awake.     Anemia Symptom Improvement: Abdominal pain increased from a 7 to a 9 on the pain scale, despite administration of oxyCODONE  and HYDROmorphone  in the 7 BT [facility] PRN department.

## 2023-10-15 LAB — CBC W/ AUTO DIFF
BASOPHILS ABSOLUTE COUNT: 0 10*9/L (ref 0.0–0.1)
BASOPHILS RELATIVE PERCENT: 0.4 %
EOSINOPHILS ABSOLUTE COUNT: 0.2 10*9/L (ref 0.0–0.5)
EOSINOPHILS RELATIVE PERCENT: 3.5 %
HEMATOCRIT: 25.2 % — ABNORMAL LOW (ref 34.0–44.0)
HEMOGLOBIN: 8.3 g/dL — ABNORMAL LOW (ref 11.3–14.9)
LYMPHOCYTES ABSOLUTE COUNT: 1.8 10*9/L (ref 1.1–3.6)
LYMPHOCYTES RELATIVE PERCENT: 34 %
MEAN CORPUSCULAR HEMOGLOBIN CONC: 33 g/dL (ref 32.0–36.0)
MEAN CORPUSCULAR HEMOGLOBIN: 25.2 pg — ABNORMAL LOW (ref 25.9–32.4)
MEAN CORPUSCULAR VOLUME: 76.4 fL — ABNORMAL LOW (ref 77.6–95.7)
MEAN PLATELET VOLUME: 9.9 fL (ref 6.8–10.7)
MONOCYTES ABSOLUTE COUNT: 0.6 10*9/L (ref 0.3–0.8)
MONOCYTES RELATIVE PERCENT: 12.2 %
NEUTROPHILS ABSOLUTE COUNT: 2.6 10*9/L (ref 1.8–7.8)
NEUTROPHILS RELATIVE PERCENT: 49.9 %
PLATELET COUNT: 255 10*9/L (ref 150–450)
RED BLOOD CELL COUNT: 3.3 10*12/L — ABNORMAL LOW (ref 3.95–5.13)
RED CELL DISTRIBUTION WIDTH: 13 % (ref 12.2–15.2)
WBC ADJUSTED: 5.2 10*9/L (ref 3.6–11.2)

## 2023-10-15 MED ADMIN — cetirizine (ZYRTEC) tablet 10 mg: 10 mg | ORAL | @ 01:00:00

## 2023-10-15 MED ADMIN — oxyCODONE (ROXICODONE) 5 mg/5 mL solution 20 mg: 20 mg | ORAL | @ 04:00:00 | Stop: 2023-10-27

## 2023-10-15 MED ADMIN — ondansetron (ZOFRAN) injection 4 mg: 4 mg | INTRAVENOUS | @ 09:00:00

## 2023-10-15 MED ADMIN — ondansetron (ZOFRAN) injection 4 mg: 4 mg | INTRAVENOUS | @ 18:00:00

## 2023-10-15 MED ADMIN — baclofen (LIORESAL) tablet 5 mg: 5 mg | ORAL | @ 14:00:00

## 2023-10-15 MED ADMIN — oxyCODONE (ROXICODONE) 5 mg/5 mL solution 20 mg: 20 mg | ORAL | @ 18:00:00 | Stop: 2023-10-27

## 2023-10-15 MED ADMIN — famotidine (PEPCID) tablet 20 mg: 20 mg | ORAL | @ 01:00:00

## 2023-10-15 MED ADMIN — cetirizine (ZYRTEC) tablet 10 mg: 10 mg | ORAL | @ 14:00:00 | Stop: 2023-10-15

## 2023-10-15 MED ADMIN — escitalopram oxalate (LEXAPRO) tablet 5 mg: 5 mg | ORAL | @ 14:00:00

## 2023-10-15 MED ADMIN — ondansetron (ZOFRAN) injection 4 mg: 4 mg | INTRAVENOUS | @ 02:00:00

## 2023-10-15 MED ADMIN — HYDROmorphone (PF) (DILAUDID) injection 1 mg: 1 mg | INTRAVENOUS | @ 02:00:00 | Stop: 2023-10-27

## 2023-10-15 MED ADMIN — oxyCODONE (ROXICODONE) 5 mg/5 mL solution 20 mg: 20 mg | ORAL | @ 10:00:00 | Stop: 2023-10-27

## 2023-10-15 MED ADMIN — HYDROmorphone (PF) (DILAUDID) injection 1 mg: 1 mg | INTRAVENOUS | @ 14:00:00 | Stop: 2023-10-27

## 2023-10-15 MED ADMIN — pantoprazole (Protonix) injection 40 mg: 40 mg | INTRAVENOUS | @ 01:00:00

## 2023-10-15 MED ADMIN — acetaminophen (TYLENOL) tablet 1,000 mg: 1000 mg | ORAL | @ 04:00:00

## 2023-10-15 MED ADMIN — HYDROmorphone (PF) (DILAUDID) injection 1 mg: 1 mg | INTRAVENOUS | @ 19:00:00 | Stop: 2023-10-27

## 2023-10-15 MED ADMIN — ondansetron (ZOFRAN) injection 4 mg: 4 mg | INTRAVENOUS

## 2023-10-15 MED ADMIN — acetaminophen (TYLENOL) tablet 1,000 mg: 1000 mg | ORAL | @ 18:00:00

## 2023-10-15 MED ADMIN — pantoprazole (Protonix) injection 40 mg: 40 mg | INTRAVENOUS | @ 14:00:00

## 2023-10-15 MED ADMIN — HYDROmorphone (PF) (DILAUDID) injection 1 mg: 1 mg | INTRAVENOUS | Stop: 2023-10-27

## 2023-10-15 MED ADMIN — oxyCODONE (ROXICODONE) 5 mg/5 mL solution 20 mg: 20 mg | ORAL | @ 22:00:00 | Stop: 2023-10-27

## 2023-10-15 MED ADMIN — baclofen (LIORESAL) tablet 5 mg: 5 mg | ORAL | @ 18:00:00

## 2023-10-15 MED ADMIN — scopolamine (TRANSDERM-SCOP) 1 mg over 3 days topical patch 1 mg: 1 | TOPICAL | @ 22:00:00

## 2023-10-15 MED ADMIN — HYDROmorphone (PF) (DILAUDID) injection 1 mg: 1 mg | INTRAVENOUS | @ 09:00:00 | Stop: 2023-10-27

## 2023-10-15 NOTE — Unmapped (Signed)
 Allergy /Immunology   NEW INPATIENT CONSULTATION       Consulting Physician:    Dr. [name], MD    Reason for Consult: premedication recommendations for IV iron     Assessment and Plan:   Allergy /Immunology Problems  - History of reactions to IV iron     Assessment: [name] [name] is a 23 y.o. 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 She has chronic iron  deficiency anemia related to chronic GI bleeds and is planned for an IV iron  infusion this admission. We have been consulted regarding recommendations for pre-medications prior to infusion.    As detailed below, patient has a history of iron  hypersensitivity reactions to various formulations including iron  gluconate (2023), iron  sucrose (2024). She has since tolerated iron  gluconate with premedication in 2024. However, she did experience milder cutaneous symptoms of flushing and pruritus during infusion. She has also tolerated other formulations including iron  dextran (2024) and FeraHeme  (2024, 06/2023) with premedication regimen of steroids and antihistamines, though with similar mild cutaneous symptoms with these despite pre-medications. She had a normal tryptase level during a mild reaction in 10/2022. As detailed in prior outpatient visit on 09/24/22, prior reactions are most consistent with non-IgE mediated infusion reactions which are common with IV iron  and thought to be related to nanoparticle mediated complement activation, rather than true IgE-mediated drug allergy . As discussed previously, we recommend using low molecular weight formulations when available, which have a lower risk of these infusion reactions. We also recommend a prolonged pre-medication regimen and a slow infusion rate to further decrease risk of infusion reaction.    She tolerated Feraheme  with premedication during admission in 06/2023 with only mild cutaneous symptoms. However, this is now unavailable for use inpatient and currently, iron  dextran and ferric gluconate are available inpatient. Okay to use either formulation from our perspective as she has tolerated both formulas with premedication with only mild symptoms in the past year. Discussed with patient and primary team that pre-med regimen is intended to reduce risk of severe reactions but she may still experience breakthrough symptoms such as urticaria. We recommend running infusion slowly to minimize risk of infusion reactions and treat mild symptoms with additional antihistamines as needed. A test dose is not necessary as we have low concern for an IgE mediated reaction. Following discussion, patient prefers to try iron  dextran as her breakthrough symptoms were milder with this formulation and is tentatively planned for infusion on 8/8.    RECOMMENDATIONS  Infusion:  - Planned for iron  dextran infusion on 8/8. Recommend using a slow rate of infusion to decrease risk of infusion reaction.    Pretreatment:  Antihistamines:  - Pre-treat with cetirizine  20mg  BID for full 48 hours prior to infusion. Ideally would recommend treating entire week prior to infusion in the future  - On the day of infusion, treat with cetirizine  20 qAM, then IV benadryl  50mg  and famotidine  40 mg IV 30 minutes prior to infusion, then cetirizine  20 mg qPM  - Continue cetirizine  20 mg BID for at least 72 hours after infusion or until symptoms are resolved before weaning off    Steroids:  - Recommend steroid pretreatment with 50mg  prednisone  13 hours prior to the infusion, 50mg  prednisone  7 hours prior to the infusion, then the  solumedrol dose 40mg  (IV dose) prior to the infusion.    Fluids:   - Start IV fluid hydration 30 minutes prior to the infusion    If reaction occurs:  - Recommend benadryl  25-50mg  or cetirizine  10-20mg  for mild symptoms, such as urticaria or pruritus  - Recommend IM epinephrine  for severe reactions (i.e. anaphylaxis)  - Please draw tryptase level if any reaction occurs, ideally between 30-60 minutes of symptom onset    Thank you for this interesting consult and for involving us  in this patient's care. Please contact Allergy  Fellow on Call with any questions.     This was an 60 minute consultation, including review of the patient's medical record, history taking and physical examination of the patient, communication with the primary admitting team, and counseling the patient on infusion reactions.     This patient was seen and evaluated with Dr. Blima Kobs who is in agreement with the above assessment and plan.     Huston Stair, MD  [place]    Subjective:   HPI: I had the pleasure of seeing [name] [name], a 23 y.o. female seen in consultation at the request of Dr. [name], MD for further evaluation of history of reactions to IV iron  with plan for inpatient iron  infusion. We were consulted regarding recommendations for pre-medications prior to infusion.    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 She has chronic iron  deficiency anemia related to chronic GI bleeds. Hematology was consulted during this admission and recommended IV iron  supplementation given symptomatic anemia with presyncopal episode.    Patient has a history of iron  hypersensitivity reactions. She had an episode of urticaria and hand and foot swelling following ferric gluconate infusion in 2023. She tolerated this previously without symptoms. She has been seen in our allergy  clinic (Dr. Shellia) on 09/24/22. As discussed during this visit, prior reactions have been most consistent with non-IgE mediated infusion reactions rather than true IgE-mediated drug allergy . She was recommended to formulations with lower risk of reactions such as Monoferric for future infusions with a longer steroid and anti-histamine pre-medication regimen.    She received ferric gluconate again in 2024 with premedications and has tolerated with milder cutaneous symptoms of flushing and pruritus. She has had similar cutaneous reactions to other formulations in the past year despite premedications, including iron  sucrose, iron  dextran and Feraheme . Tryptase level was normal during a reaction in 10/2022.     Today, she is doing well without any symptoms of urticaria, flushing or pruritus. She denies chronic urticaria outside of medication reactions. She is planned for iron  infusion tomorrow and states that she would prefer to try iron  dextran which she tolerated with only mild breakthrough symptoms in 2024.    Review of Systems:  As per HPI, all other systems reviewed are negative.    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     Objective:   PE:    Vitals:    10/14/23 2036 10/15/23 0517 10/15/23 0930 10/15/23 1428   BP: 119/63 110/64 121/63 115/62   Pulse: 83 73 73 78   Resp: 17 17 24     Temp: 36.8 ??C (98.2 ??F) 36.9 ??C (98.4 ??F) 36.8 ??C (98.2 ??F) 36.5 ??C (97.7 ??F)   TempSrc: Oral Oral Oral Oral   SpO2: 96% 98% 98% 96%   Weight:       Height:         General:  Well nourished female in no acute distress without toxic appearance.  Skin:  No eczematous patches or rash.  HEENT: No conjunctival injection.  CV: No cyanosis, edema or pallor.  Respiratory: NWOB on room air. No tachypnea.  Neurologic:  Alert and oriented.  Psychiatric: Pleasant, cooperative         [1]   Past Medical History:  Diagnosis Date    Abdominal pain     Acid reflux     occas    Anesthesia complication     itching, shaking, coldness; last few surgeries have gone much better    Cancer        Cataract of right eye     COVID-19 virus infection 01/2019    Cyst of thyroid determined by ultrasound     monitoring    [name]     2 right forearm, 1 left thigh, 1 right scapula, 1 under left clavicle; multiple    Difficult intravenous access     [name] (familial adenomatous polyposis)     [name] (HHS-HCC)     Gastric polyps     History of chemotherapy     last treatment approx 05/2019    History of colon polyps     History of COVID-19 01/2019    Ileus    03/16/2022    Iron  deficiency anemia due to chronic blood loss     received iron  infusion 11-2019    PONV (postoperative nausea and vomiting)     Rectal bleeding     Syncopal episodes     especially if becoming dehydrated   [2]   Past Surgical History:  Procedure Laterality Date    COLON SURGERY      cyroablation      cystis removal      desmoid removal      IR INSERT PORT AGE GREATER THAN 5 YRS  07/20/2023    IR INSERT PORT AGE GREATER THAN 5 YRS 07/20/2023 Guilford Hicks, MD IMG VIR H[facility]    PR CLOSE ENTEROSTOMY,RESEC+ANAST N/A 10/09/2020    Procedure: ILEOSTOMY TAKEDOWN;  Surgeon: Evalene Karalee Shilling, MD;  Location: OR [place];  Service: General Surgery    PR COLONOSCOPY W/BIOPSY SINGLE/MULTIPLE N/A 10/27/2012    Procedure: COLONOSCOPY, FLEXIBLE, PROXIMAL TO SPLENIC FLEXURE; WITH BIOPSY, SINGLE OR MULTIPLE;  Surgeon: Annalee LABOR Mir, MD;  Location: PEDS PROCEDURE ROOM [facility];  Service: Gastroenterology    PR COLONOSCOPY W/BIOPSY SINGLE/MULTIPLE N/A 09/14/2013    Procedure: COLONOSCOPY, FLEXIBLE, PROXIMAL TO SPLENIC FLEXURE; WITH BIOPSY, SINGLE OR MULTIPLE;  Surgeon: Annalee LABOR Mir, MD;  Location: PEDS PROCEDURE ROOM [facility];  Service: Gastroenterology    PR COLONOSCOPY W/BIOPSY SINGLE/MULTIPLE N/A 11/08/2014    Procedure: COLONOSCOPY, FLEXIBLE, PROXIMAL TO SPLENIC FLEXURE; WITH BIOPSY, SINGLE OR MULTIPLE;  Surgeon: Annalee Dine Mir, MD;  Location: PEDS PROCEDURE ROOM [facility];  Service: Gastroenterology  PR COLONOSCOPY W/BIOPSY SINGLE/MULTIPLE N/A 12/26/2015    Procedure: COLONOSCOPY, FLEXIBLE, PROXIMAL TO SPLENIC FLEXURE; WITH BIOPSY, SINGLE OR MULTIPLE;  Surgeon: Annalee Dine Mir, MD;  Location: PEDS PROCEDURE ROOM [facility];  Service: Gastroenterology    PR COLONOSCOPY W/BIOPSY SINGLE/MULTIPLE N/A 09/02/2017    Procedure: COLONOSCOPY, FLEXIBLE, PROXIMAL TO SPLENIC FLEXURE; WITH BIOPSY, SINGLE OR MULTIPLE;  Surgeon: Annalee Dine Mir, MD;  Location: PEDS PROCEDURE ROOM [place];  Service: Gastroenterology    PR COLSC FLX W/REMOVAL LESION BY HOT BX FORCEPS N/A 08/27/2016    Procedure: COLONOSCOPY, FLEXIBLE, PROXIMAL TO SPLENIC FLEXURE; W/REMOVAL TUMOR/POLYP/OTHER LESION, HOT BX FORCEP/CAUTE;  Surgeon: Annalee Dine Mir, MD;  Location: PEDS PROCEDURE ROOM [facility];  Service: Gastroenterology    PR COLSC FLX W/RMVL OF TUMOR POLYP LESION SNARE TQ N/A 02/25/2019    Procedure: COLONOSCOPY FLEX; W/REMOV TUMOR/LES BY SNARE;  Surgeon: Lamar Jayson Ards, MD;  [facility]: [facility] PROCEDURES [place] [facility];  Service: Gastroenterology    PR COLSC FLX W/RMVL OF TUMOR POLYP LESION SNARE TQ N/A 03/13/2020    Procedure: COLONOSCOPY FLEX; W/REMOV TUMOR/LES BY SNARE;  Surgeon: Lamar Jayson Ards, MD;  Location: [facility] [place] [facility];  Service: Gastroenterology    PR ENDOSCOPY UPPER SMALL INTESTINE N/A 07/15/2023    Procedure: SMALL INTESTINAL ENDOSCOPY, ENTEROSCOPY BEYOND SECOND PORTION OF DUODENUM, NOT INCL ILEUM; DX, INCL COLLECTION OF SPECIMEN(S) BY BRUSHING OR WASHING, WHEN PERFORMED;  Surgeon: Charlanne Kipper, MD; [facility];  Service: Gastrointestinal    PR EXC SKIN BENIG 2.1-3 CM TRUNK,ARM,LEG Right 02/25/2017    Procedure: EXCISION, BENIGN LESION INCLUDE MARGINS, EXCEPT SKIN TAG, LEGS; EXCISED DIAMETER 2.1 TO 3.0 CM;  Surgeon: Jeyhan Suzan Wood, MD;  Location: CHILDRENS OR [facility];  Service: Plastics    PR EXC SKIN BENIG 3.1-4 CM TRUNK,ARM,LEG Right 02/25/2017    Procedure: EXCISION, BENIGN LESION INCLUDE MARGINS, EXCEPT SKIN TAG, ARMS; EXCISED DIAMETER 3.1 TO 4.0 CM;  Surgeon: Jeyhan Suzan Wood, MD;  Location: CHILDRENS OR [facility];  Service: Plastics    PR EXC SKIN BENIG >4 CM FACE,FACIAL Right 02/25/2017    Procedure: EXCISION, OTHER BENIGN LES INCLUD MARGIN, FACE/EARS/EYELIDS/NOSE/LIPS/MUCOUS MEMBRANE; EXCISED DIAM >4.0 CM;  Surgeon: Jeyhan Suzan Wood, MD;  Location: CHILDRENS OR [facility];  Service: Plastics    PR EXC TUMOR SOFT TISSUE LEG/ANKLE SUBQ 3+CM Right 08/05/2019    Procedure: EXCISION, TUMOR, SOFT TISSUE OF LEG OR ANKLE AREA, SUBCUTANEOUS; 3 CM OR GREATER;  Surgeon: Delon Charmaine Myron, MD;  Location: MAIN OR [place];  Service: Plastics    PR EXC TUMOR SOFT TISSUE LEG/ANKLE SUBQ <3CM Right 08/05/2019    Procedure: EXCISION, TUMOR, SOFT TISSUE OF LEG OR ANKLE AREA, SUBCUTANEOUS; LESS THAN 3 CM;  Surgeon: Delon Charmaine Myron, MD;  Location: MAIN OR [facility];  Service: Plastics    PR LAP, SURG PROCTECTOMY W J-POUCH N/A 08/10/2020    Procedure: ROBOTIC ASSISTED LAPAROSCOPIC PROCTOCOLECTOMY, ILEAL J POUCH, WITH OSTOMY;  Surgeon: Evalene Karalee Shilling, MD;  Location: OR [place];  Service: General Surgery    PR NDSC EVAL INTSTINAL POUCH DX W/COLLJ SPEC SPX N/A 01/23/2021    Procedure: ENDO EVAL SM INTEST POUCH; DX;  Surgeon: Mikle Cleora Martinez, MD;  Location: [facility] [place] [facility];  Service: Gastroenterology    PR NDSC EVAL INTSTINAL POUCH DX W/COLLJ SPEC SPX N/A 08/27/2021    Procedure: ENDO EVAL SM INTEST POUCH; DX;  Surgeon: Rodgers JAYSON Minor, MD;  Location: [facility]URES [facility]; Service: Gastroenterology    PR NDSC EVAL INTSTINAL POUCH DX W/COLLJ SPEC SPX N/A  12/09/2021    Procedure: ENDO EVAL SM INTEST POUCH; DX;  Surgeon: Sherrill Liliane Olmsted, MD;  Location: [facility]URES [facility];  Service: Gastroenterology    PR NDSC EVAL INTSTINAL POUCH DX W/COLLJ [facility] SPX Left 04/09/2022    Procedure: ENDO EVAL SM INTEST POUCH; DX;  Surgeon: Lucian Mikle Pfeiffer, MD;  Location: [facility] [place] [facility];  Service: Gastroenterology    PR NDSC EVAL INTSTINAL POUCH DX W/COLLJ SPEC SPX N/A 08/05/2022    Procedure: ENDO EVAL SM INTEST POUCH; DX;  Surgeon: Lucian Mikle Pfeiffer, MD;  Location: [facility]URES [facility];  Service: Gastroenterology    PR NDSC EVAL INTSTINAL POUCH DX W/COLLJ SPEC SPX N/A 03/13/2023    Procedure: ENDO EVAL SM INTEST POUCH; DX;  Surgeon: Rogerio Lauraine Sor, MD;  Location: [facility]URES [facility];  Service: Gastroenterology    PR NDSC EVAL INTSTINAL POUCH DX W/COLLJ SPEC SPX N/A 07/06/2023    Procedure: ENDO EVAL SM INTEST POUCH; DX;  Surgeon: Junette Fonda CROME, MD;  Location: [facility]URES [facility];  Service: Gastroenterology    PR NDSC EVAL INTSTINAL POUCH DX W/COLLJ SPEC SPX N/A 10/14/2023    Procedure: ENDO EVAL SM INTEST POUCH; DX;  Surgeon: Brenita Vannie BIRCH, MD;  Location: GI PROCEDURES [facility];  Service: Gastroenterology    PR NDSC EVAL INTSTINAL POUCH W/BX SINGLE/MULTIPLE N/A 01/20/2022    Procedure: ENDOSCOPIC EVAL OF SMALL INTESTINAL POUCH; DIAGNOSTIC, No biopsies;  Surgeon: Gwenn Dallas Ruth, MD;  Location: GI PROC[facility]URES [facility];  Service: Gastroenterology    PR NDSC EVAL INTSTINAL POUCH W/BX SINGLE/MULTIPLE N/A 02/13/2022    Procedure: ENDOSCOPIC EVAL OF SMALL INTESTINAL POUCH; DIAGNOSTIC, WITH BIOPSY;  Surgeon: Georgine Arlean Min, MD;  Location: GI PROCEDURES [facility];  Service: Gastroenterology    PR NDSC EVAL INTSTINAL POUCH W/BX SINGLE/MULTIPLE N/A 03/13/2023    Procedure: ENDOSCOPIC EVAL OF SMALL INTESTINAL POUCH; DIAGNOSTIC, WITH BIOPSY;  Surgeon: Rogerio Lauraine Sor, MD;  Location: GI PROCEDURES [facility];  Service: Gastroenterology    PR UNLISTED PROCEDURE SMALL INTESTINE  01/23/2021    Procedure: UNLISTED PROCEDURE, SMALL INTESTINE;  Surgeon: Mikle Pfeiffer Lucian, MD;  [facility] [place] [facility];  Service: Gastroenterology    PR UNLISTED PROCEDURE SMALL INTESTINE  02/13/2022    Procedure: UNLISTED PROCEDURE, SMALL INTESTINE;  Surgeon: Georgine Arlean Min, MD;  Location: GI [facility]URES [facility];  Service: Gastroenterology    PR UPPER GI ENDOSCOPY,BIOPSY N/A 10/27/2012    Procedure: UGI ENDOSCOPY; WITH BIOPSY, SINGLE OR MULTIPLE;  Surgeon: Annalee LABOR Mir, MD;  Location: PEDS PROCEDURE ROOM [facility];  Service: Gastroenterology    PR UPPER GI ENDOSCOPY,BIOPSY N/A 09/14/2013    Procedure: UGI ENDOSCOPY; WITH BIOPSY, SINGLE OR MULTIPLE;  Surgeon: Annalee LABOR Mir, MD;  Location: PEDS PROCEDURE ROOM [facility];  Service: Gastroenterology    PR UPPER GI ENDOSCOPY,BIOPSY N/A 11/08/2014    Procedure: UGI ENDOSCOPY; WITH BIOPSY, SINGLE OR MULTIPLE;  Surgeon: Annalee Dine Mir, MD;  Location: PEDS PROCEDURE ROOM [facility];  Service: Gastroenterology    PR UPPER GI ENDOSCOPY,BIOPSY N/A 12/26/2015    Procedure: UGI ENDOSCOPY; WITH BIOPSY, SINGLE OR MULTIPLE;  Surgeon: Annalee Dine Mir, MD;  Location: PEDS PROCEDURE ROOM [facility];  Service: Gastroenterology    PR UPPER GI ENDOSCOPY,BIOPSY N/A 08/27/2016    Procedure: UGI ENDOSCOPY; WITH BIOPSY, SINGLE OR MULTIPLE;  Surgeon: Annalee Dine Mir, MD;  Location: PEDS PROCEDURE ROOM [facility];  Service: Gastroenterology    PR UPPER GI ENDOSCOPY,BIOPSY N/A 09/02/2017    Procedure: UGI ENDOSCOPY; WITH BIOPSY, SINGLE  OR MULTIPLE;  Surgeon: Annalee Dine Mir, MD;  Location: PEDS PROCEDURE ROOM [facility];  Service: Gastroenterology    PR UPPER GI ENDOSCOPY,BIOPSY N/A 03/13/2020    Procedure: UGI ENDOSCOPY; WITH BIOPSY, SINGLE OR MULTIPLE;  Surgeon: Lamar Jayson Ards, MD;  Location: [facility] [place] [facility]; Service: Gastroenterology    PR UPPER GI ENDOSCOPY,BIOPSY N/A 09/05/2021    Procedure: UGI ENDOSCOPY; WITH BIOPSY, SINGLE OR MULTIPLE;  Surgeon: Rosamond Never, MD;  Location: GI [facility]URES [facility];  Service: Gastroenterology    PR UPPER GI ENDOSCOPY,DIAGNOSIS N/A 01/20/2022    Procedure: UGI ENDO, INCLUDE ESOPHAGUS, STOMACH, & DUODENUM &/OR JEJUNUM; DX W/WO COLLECTION SPECIMN, BY BRUSH OR WASH;  Surgeon: Gwenn Dallas Ruth, MD;  Location: GI PROCEDURES [facility];  Service: Gastroenterology    TUMOR REMOVAL      multiple-head, neck, back, hand, right flank, multiple   [3]   Current Facility-Administered Medications:     acetaminophen  (TYLENOL ) tablet 1,000 mg, 1,000 mg, Oral, Q8H PRN, Dholakia, Vibhaben, AGNP, 1,000 mg at 10/15/23 1348    baclofen  (LIORESAL ) tablet 5 mg, 5 mg, Oral, TID, Dholakia, Vibhaben, AGNP, 5 mg at 10/15/23 1333    buprenorphine  20 mcg/hour transdermal patch 1 patch, 1 patch, Transdermal, Q7 Days, Dholakia, Vibhaben, AGNP, 1 patch at 10/14/23 1330    butalbital -acetaminophen -caffeine  (ESGIC ) per tablet 1 tablet, 1 tablet, Oral, BID PRN, Dholakia, Vibhaben, AGNP    cetirizine  (ZYRTEC ) tablet 20 mg, 20 mg, Oral, BID, McEntee, Delon Amble, MD    escitalopram  oxalate (LEXAPRO ) tablet 5 mg, 5 mg, Oral, Daily, Dholakia, Vibhaben, AGNP, 5 mg at 10/15/23 0944    famotidine  (PEPCID ) tablet 20 mg, 20 mg, Oral, Nightly, Dholakia, Vibhaben, AGNP, 20 mg at 10/14/23 2100    fluticasone  propionate (FLONASE ) 50 mcg/actuation nasal spray 2 spray, 2 spray, Topical, Daily PRN, Dholakia, Vibhaben, AGNP    HYDROmorphone  (PF) (DILAUDID ) injection 1 mg, 1 mg, Intravenous, Q4H PRN, Dholakia, Vibhaben, AGNP, 1 mg at 10/15/23 1519    lactated Ringers  infusion, 10 mL/hr, Intravenous, Continuous, Dholakia, Vibhaben, AGNP    LORazepam  (ATIVAN ) tablet 0.5 mg, 0.5 mg, Oral, BID PRN, Dholakia, Vibhaben, AGNP    naloxegol  (MOVANTIK ) 12.5 mg tablet 12.5 mg, 12.5 mg, Oral, Daily PRN, Dholakia, Vibhaben, AGNP    naloxone  (NARCAN ) injection 0.4 mg, 0.4 mg, Intravenous, Once PRN, Dholakia, Vibhaben, AGNP    ondansetron  (ZOFRAN ) injection 4 mg, 4 mg, Intravenous, Q6H PRN, Dholakia, Vibhaben, AGNP, 4 mg at 10/15/23 1349    oxyCODONE  (ROXICODONE ) 5 mg/5 mL solution 20 mg, 20 mg, Oral, Q4H PRN, Dholakia, Vibhaben, AGNP, 20 mg at 10/15/23 1334    pantoprazole  (Protonix ) injection 40 mg, 40 mg, Intravenous, BID, Dholakia, Vibhaben, AGNP, 40 mg at 10/15/23 0944    scopolamine  (TRANSDERM-SCOP) 1 mg over 3 days topical patch 1 mg, 1 patch, Topical, Q72H, McEntee, Delon Amble, MD  [4]   Allergies  Allergen Reactions    Adhesive Tape-Silicones Hives and Rash     Paper tape and Tegederm ok. Biopatch causes blistering but has tolerated CHG Tegaderm    Ferrlecit  [Sodium Ferric Gluconat-Sucrose] Swelling and Rash    Levofloxacin  Swelling and Rash     Swelling in mouth, rash,     Methylnaltrexone       Per Patient: I lost bowel control, severe abdominal cramping, and elevated BP    Neomycin Swelling     Rxn after ear drops; ear swelling    Papaya Hives    Morphine Nausea And Vomiting    Zosyn [Piperacillin-Tazobactam] Itching and  Rash     Red and itchy    Adhesive      Other Reaction(s): Not available    Compazine [Prochlorperazine] Other (See Comments)     Extreme agitation    Iron  Analogues     Metoclopramide  Diarrhea     Other Reaction(s): Not available    Reglan  [Metoclopramide  Hcl] Diarrhea    Iron  Dextran Itching     Received iron  dextran 06/08/22 over 12 hours, had itching and redness/flushing during the infusion and for a couple days after. Required IV benadryl  w/flares between doses and ultimately treated w/IV methylpred for 2 days.     Latex, Natural Rubber Rash   [5]   Family History  Problem Relation Age of Onset    No Known Problems Mother     No Known Problems Father     No Known Problems Sister     No Known Problems Brother     Stroke Maternal Grandmother     Other Maternal Grandmother         benign lesions of liver and pancreas, further details unknown    Cancer Maternal Grandmother     Diabetes Maternal Grandmother     Hypertension Maternal Grandmother     Thyroid disease Maternal Grandmother     Arthritis Maternal Grandfather     Asthma Maternal Grandfather     COPD Paternal Grandmother         Deceased    Miscarriages / Stillbirths Paternal Grandmother     Alcohol abuse Paternal Grandfather         Deceased    No Known Problems Maternal Aunt     No Known Problems Maternal Uncle     No Known Problems Paternal Aunt     No Known Problems Paternal Uncle     Anesthesia problems Neg Hx     Broken bones Neg Hx     Cancer Neg Hx     Clotting disorder Neg Hx     Collagen disease Neg Hx     Diabetes Neg Hx     Dislocations Neg Hx     Fibromyalgia Neg Hx     Gout Neg Hx     Hemophilia Neg Hx     Osteoporosis Neg Hx     Rheumatologic disease Neg Hx     Scoliosis Neg Hx     Severe sprains Neg Hx     Sickle cell anemia Neg Hx     Spinal Compression Fracture Neg Hx     Melanoma Neg Hx     Basal cell carcinoma Neg Hx     Squamous cell carcinoma Neg Hx    [6]   Social History  Tobacco Use    Smoking status: Never     Passive exposure: Past    Smokeless tobacco: Never   Vaping Use    Vaping status: Never Used   Substance Use Topics    Alcohol use: Never    Drug use: Never

## 2023-10-15 NOTE — Unmapped (Signed)
 Massage Therapy Inpatient Consult Note    Patient: [name] [name]   Pronouns: she/her/hers    Date of birth: Jun 17, 2000    Service: Clinical Massage    Requesting Attending Physician :  [name], MD  Service Requesting Consult : [facility])  Reason for Consult: relaxation    Visit Date: 10/15/2023    Precautions: No active isolations    Allergies:  Adhesive tape-silicones; Ferrlecit  [sodium ferric gluconat-sucrose]; Levofloxacin ; Methylnaltrexone ; Neomycin; Papaya; Morphine; Zosyn [piperacillin-tazobactam]; Adhesive; Compazine [prochlorperazine]; Iron  analogues; Metoclopramide ; Reglan  [metoclopramide  hcl]; Iron  dextran; and Latex, natural rubber    Code Status:  Full Code    ----------------------------------------------------------------    Visit Summary:  [name] [name] and her mom, [name] [name], were both in the room & happy to see me when I arrived. [name] [name] was up for receiving massage therapy, and asked for special focus on her legs, since she's been falling recently and they feel sore. I noticed that the tumors in her R thigh seem to be bigger than when I last saw her in May this year.     She was feeling frustrated with the difficulty of communication about her needs and allergies, but seemed able to talk it out and feel a little better by the end of the session.     She didn't bring her prafo boots, so I reminded her to keep doing dorsiflexion whenever she's lying in bed.     I usually check on patients weekly, but if I am able to come more often I will! Please feel free to send me a message on EPIC chat anytime they request a session! Thank you!     ----------------------------------------------------------------  I use a modified Walton Pressure Scale (WPS) in an effort to standardize documentation of this critical component of any touch therapy. My hope is to make the pressure I use clearly understandable to all team members involved in the care of each patient I am fortunate to work with.    Level 0: No pressure. Energy work or Management consultant. (No tissue is moved)   Level zero is not included in the WPS, I added it for my practice.    Level 1: Light lotioning (Only the skin is moved)  Level 2: Heavy lotioning (Similar to rubbing in sunscreen. Slight displacement of the superficial adipose layer and superficial muscles)  Level 3: Medium pressure (Medium layers of adipose, muscle, and blood vessels are moved, adjacent tissues move slightly)    Levels 4 and 5 are only used with healthy patients. I do not go beyond a level 3 in my practice.    Level 4: Strong pressure (Deep layers of adipose, muscle, blood vessels, and fascia are moved)  Level 5: (Significant compression of all tissues. Therapists bone affects patient's bone)  ----------------------------------------------------------------    Treatment Goals: To assist with the management of pain and tension due to disease, treatment, and prolonged hospital stay.       Procedure Detail: Effleurage, Comfort touch, Range of motion, and Assisted passive stretching    Pressure Level: 0-2    Position: Fowler    Areas of the body massaged: Head/Scalp, Neck, Shoulders, Upper extremities INcluding hands, and Lower extremities INcluding feet      Areas of the body avoided:       Lubricant Used: Combination of hospital cream & lotion with essential oils added:lavender and rose    Music: Good conversation      Treatment Time: 1 hour    Total time spent  on this patient's case today: 2+ hours    This is visit #: 18    ----------------------------------------------------------------  Lab Results   Component Value Date    PLT 255 10/15/2023     ----------------------------------------------------------------  Current/Recent Oncology Treatment: Chemotherapy, Surgery, and Other:    Site Restrictions: Site Restrictions : Interrupted lymphatic pathway, Tumor, Ostomy, and Port:R chest    Position Restrictions: Position Restrictions: As comfort allows    PPE Used: Mask and Gloves    ----------------------------------------------------------------    Before treatment: (Mild / Moderate / Severe) or N/A (Not Assessed)           Pain: Mild and Moderate  Stress/Tension: Moderate  Swelling: N/A      After treatment: (Better / Same / Worse) or N/A    Pain: Same  Stress/Tension: Better  Swelling: N/A    ------------------------------------------------------------------    Surgical History:  Past Surgical History[1]    ----------------------------------------------------------------    Plan/Follow-Up:    A recommendation was made for Clinical Massage at least 1x per every 2 weeks      Patient resting in bed post-session, call light/all needs within reach, fall precautions in place.        I am deeply honored to be a part of this patient's care. Thank you so very much for the opportunity to work with them!  Please reach out with any questions or concerns.   .................................................................................................................................................................................................................  There is no need to put in a new consult at this admission.   If this patient requests massage during this admission, you can send me a message on EPIC chat.   ..................................................................................................................................................................................................................  Please feel free to put in a new consult order for massage therapy at any subsequent admission. Thank you so much!   .................................................................................................................................................................................................         [1]   Past Surgical History:  Procedure Laterality Date    COLON SURGERY      cyroablation      cystis removal      desmoid removal      IR INSERT PORT AGE GREATER THAN 5 YRS  07/20/2023    IR INSERT PORT AGE GREATER THAN 5 YRS 07/20/2023 Guilford Hicks, MD IMG VIR [facility]    PR CLOSE ENTEROSTOMY,RESEC+ANAST N/A 10/09/2020    Procedure: ILEOSTOMY TAKEDOWN;  Surgeon: Evalene Karalee Shilling, MD;  Location: OR [place];  Service: General Surgery    PR COLONOSCOPY W/BIOPSY SINGLE/MULTIPLE N/A 10/27/2012    Procedure: COLONOSCOPY, FLEXIBLE, PROXIMAL TO SPLENIC FLEXURE; WITH BIOPSY, SINGLE OR MULTIPLE;  Surgeon: Annalee LABOR Mir, MD;  Location: PEDS PROCEDURE ROOM [facility];  Service: Gastroenterology    PR COLONOSCOPY W/BIOPSY SINGLE/MULTIPLE N/A 09/14/2013    Procedure: COLONOSCOPY, FLEXIBLE, PROXIMAL TO SPLENIC FLEXURE; WITH BIOPSY, SINGLE OR MULTIPLE;  Surgeon: Annalee LABOR Mir, MD;  Location: PEDS PROCEDURE ROOM [facility];  Service: Gastroenterology    PR COLONOSCOPY W/BIOPSY SINGLE/MULTIPLE N/A 11/08/2014    Procedure: COLONOSCOPY, FLEXIBLE, PROXIMAL TO SPLENIC FLEXURE; WITH BIOPSY, SINGLE OR MULTIPLE;  Surgeon: Annalee Dine Mir, MD;  Location: PEDS PROCEDURE ROOM [facility];  Service: Gastroenterology    PR COLONOSCOPY W/BIOPSY SINGLE/MULTIPLE N/A 12/26/2015    Procedure: COLONOSCOPY, FLEXIBLE, PROXIMAL TO SPLENIC FLEXURE; WITH BIOPSY, SINGLE OR MULTIPLE;  Surgeon: Annalee Dine Mir, MD;  Location: PEDS PROCEDURE ROOM [facility];  Service: Gastroenterology    PR COLONOSCOPY W/BIOPSY SINGLE/MULTIPLE N/A 09/02/2017    Procedure: COLONOSCOPY, FLEXIBLE, PROXIMAL TO SPLENIC FLEXURE; WITH BIOPSY, SINGLE OR MULTIPLE;  Surgeon: Annalee Dine Mir, MD;  Location: PEDS PROCEDURE ROOM  [facility];  Service: Gastroenterology    PR COLSC FLX W/REMOVAL LESION BY HOT BX FORCEPS N/A 08/27/2016    Procedure: COLONOSCOPY, FLEXIBLE, PROXIMAL TO SPLENIC FLEXURE; W/REMOVAL TUMOR/POLYP/OTHER LESION, HOT BX FORCEP/CAUTE;  Surgeon: Annalee Dine Mir, MD;  Location: PEDS PROCEDURE ROOM [facility];  Service: Gastroenterology    PR COLSC FLX W/RMVL OF TUMOR POLYP LESION SNARE TQ N/A 02/25/2019    Procedure: COLONOSCOPY FLEX; W/REMOV TUMOR/LES BY SNARE;  Surgeon: Lamar Jayson Ards, MD;  [facility]: [facility] PROC[facility] [facility];  Service: Gastroenterology    PR COLSC FLX W/RMVL OF TUMOR POLYP LESION SNARE TQ N/A 03/13/2020    Procedure: COLONOSCOPY FLEX; W/REMOV TUMOR/LES BY SNARE;  Surgeon: Lamar Jayson Ards, MD;  [facility] [place] [facility];  Service: Gastroenterology    PR ENDOSCOPY UPPER SMALL INTESTINE N/A 07/15/2023    Procedure: SMALL INTESTINAL ENDOSCOPY, ENTEROSCOPY BEYOND SECOND PORTION OF DUODENUM, NOT INCL ILEUM; DX, INCL COLLECTION OF SPECIMEN(S) BY BRUSHING OR WASHING, WHEN PERFORMED;  Surgeon: Charlanne Kipper, MD;  Location: GI PROCEDURES [facility];  Service: Gastrointestinal    PR EXC SKIN BENIG 2.1-3 CM TRUNK,ARM,LEG Right 02/25/2017    Procedure: EXCISION, BENIGN LESION INCLUDE MARGINS, EXCEPT SKIN TAG, LEGS; EXCISED DIAMETER 2.1 TO 3.0 CM;  Surgeon: Jeyhan Suzan Wood, MD;  Location: CHILDRENS OR [facility];  Service: Plastics    PR EXC SKIN BENIG 3.1-4 CM TRUNK,ARM,LEG Right 02/25/2017    Procedure: EXCISION, BENIGN LESION INCLUDE MARGINS, EXCEPT SKIN TAG, ARMS; EXCISED DIAMETER 3.1 TO 4.0 CM;  Surgeon: Jeyhan Suzan Wood, MD;  Location: CHILDRENS OR [facility];  Service: Plastics    PR EXC SKIN BENIG >4 CM FACE,FACIAL Right 02/25/2017    Procedure: EXCISION, OTHER BENIGN LES INCLUD MARGIN, FACE/EARS/EYELIDS/NOSE/LIPS/MUCOUS MEMBRANE; EXCISED DIAM >4.0 CM;  Surgeon: Jeyhan Suzan Wood, MD;  Location: CHILDRENS OR [facility];  Service: Plastics    PR EXC TUMOR SOFT TISSUE LEG/ANKLE SUBQ 3+CM Right 08/05/2019    Procedure: EXCISION, TUMOR, SOFT TISSUE OF LEG OR ANKLE AREA, SUBCUTANEOUS; 3 CM OR GREATER;  Surgeon: Delon Charmaine Myron, MD;  Location: MAIN OR ;  Service: Plastics    PR EXC TUMOR SOFT TISSUE LEG/ANKLE SUBQ <3CM Right 08/05/2019    Procedure: EXCISION, TUMOR, SOFT TISSUE OF LEG OR ANKLE AREA, SUBCUTANEOUS; LESS THAN 3 CM;  Surgeon: Delon Charmaine Myron, MD;  Location: MAIN OR [facility];  Service: Plastics    PR LAP, SURG PROCTECTOMY W J-POUCH N/A 08/10/2020    Procedure: ROBOTIC ASSISTED LAPAROSCOPIC PROCTOCOLECTOMY, ILEAL J POUCH, WITH OSTOMY;  Surgeon: Evalene Karalee Shilling, MD;  Location: OR [place];  Service: General Surgery    PR NDSC EVAL INTSTINAL POUCH DX W/COLLJ SPEC SPX N/A 01/23/2021    Procedure: ENDO EVAL SM INTEST POUCH; DX;  Surgeon: Mikle Cleora Martinez, MD;  [facility]: [facility] [place] [facility];  Service: Gastroenterology    PR NDSC EVAL INTSTINAL POUCH DX W/COLLJ SPEC SPX N/A 08/27/2021    Procedure: ENDO EVAL SM INTEST POUCH; DX;  Surgeon: Rodgers JAYSON Minor, MD;  Location: GI PROCEDURES [facility];  Service: Gastroenterology    PR NDSC EVAL INTSTINAL POUCH DX W/COLLJ SPEC SPX N/A 12/09/2021    Procedure: ENDO EVAL SM INTEST POUCH; DX;  Surgeon: Sherrill Liliane Olmsted, MD;  Location: GI PROCEDURES [facility];  Service: Gastroenterology    PR NDSC EVAL INTSTINAL POUCH DX W/COLLJ [facility] SPX Left 04/09/2022    Procedure: ENDO EVAL SM INTEST POUCH; DX;  Surgeon: Martinez Mikle Cleora, MD;  Location: [facility] [facility];  Service: Gastroenterology    PR  NDSC EVAL INTSTINAL POUCH DX W/COLLJ SPEC SPX N/A 08/05/2022    Procedure: ENDO EVAL SM INTEST POUCH; DX;  Surgeon: Lucian Mikle Pfeiffer, MD;  Location: GI PROCEDURES [facility];  Service: Gastroenterology    PR NDSC EVAL INTSTINAL POUCH DX W/COLLJ SPEC SPX N/A 03/13/2023    Procedure: ENDO EVAL SM INTEST POUCH; DX;  Surgeon: Rogerio Lauraine Sor, MD;  Location: GI PROCEDURES [facility];  Service: Gastroenterology    PR NDSC EVAL INTSTINAL POUCH DX W/COLLJ SPEC SPX N/A 07/06/2023    Procedure: ENDO EVAL SM INTEST POUCH; DX;  Surgeon: Junette Fonda CROME, MD;  Location: GI PROCEDURES [facility];  Service: Gastroenterology    PR NDSC EVAL INTSTINAL POUCH DX W/COLLJ SPEC SPX N/A 10/14/2023    Procedure: ENDO EVAL SM INTEST POUCH; DX;  Surgeon: Brenita Vannie BIRCH, MD;  Location: GI PROCEDURES [facility];  Service: Gastroenterology    PR NDSC EVAL INTSTINAL POUCH W/BX SINGLE/MULTIPLE N/A 01/20/2022    Procedure: ENDOSCOPIC EVAL OF SMALL INTESTINAL POUCH; DIAGNOSTIC, No biopsies;  Surgeon: Gwenn Dallas Ruth, MD;  Location: GI PROCEDURES [facility];  Service: Gastroenterology    PR NDSC EVAL INTSTINAL POUCH W/BX SINGLE/MULTIPLE N/A 02/13/2022    Procedure: ENDOSCOPIC EVAL OF SMALL INTESTINAL POUCH; DIAGNOSTIC, WITH BIOPSY;  Surgeon: Georgine Arlean Min, MD;  Location: GI PROCEDURES [facility];  Service: Gastroenterology    PR NDSC EVAL INTSTINAL POUCH W/BX SINGLE/MULTIPLE N/A 03/13/2023    Procedure: ENDOSCOPIC EVAL OF SMALL INTESTINAL POUCH; DIAGNOSTIC, WITH BIOPSY;  Surgeon: Rogerio Lauraine Sor, MD;  Location: GI PROCEDURES [facility];  Service: Gastroenterology    PR UNLISTED PROCEDURE SMALL INTESTINE  01/23/2021    Procedure: UNLISTED PROCEDURE, SMALL INTESTINE;  Surgeon: Mikle Pfeiffer Lucian, MD;  Location: [facility] [facility];  Service: Gastroenterology    PR UNLISTED PROCEDURE SMALL INTESTINE  02/13/2022    Procedure: UNLISTED PROCEDURE, SMALL INTESTINE;  Surgeon: Georgine Arlean Min, MD;  Location: GI PROCEDURES [facility];  Service: Gastroenterology    PR UPPER GI ENDOSCOPY,BIOPSY N/A 10/27/2012    Procedure: UGI ENDOSCOPY; WITH BIOPSY, SINGLE OR MULTIPLE;  Surgeon: Annalee LABOR Mir, MD;  Location: PEDS PROCEDURE ROOM [facility];  Service: Gastroenterology    PR UPPER GI ENDOSCOPY,BIOPSY N/A 09/14/2013    Procedure: UGI ENDOSCOPY; WITH BIOPSY, SINGLE OR MULTIPLE;  Surgeon: Annalee LABOR Mir, MD;  Location: PEDS PROCEDURE ROOM [facility];  Service: Gastroenterology    PR UPPER GI ENDOSCOPY,BIOPSY N/A 11/08/2014    Procedure: UGI ENDOSCOPY; WITH BIOPSY, SINGLE OR MULTIPLE;  Surgeon: Annalee Dine Mir, MD;  Location: PEDS PROCEDURE ROOM [facility];  Service: Gastroenterology    PR UPPER GI ENDOSCOPY,BIOPSY N/A 12/26/2015    Procedure: UGI ENDOSCOPY; WITH BIOPSY, SINGLE OR MULTIPLE;  Surgeon: Annalee Dine Mir, MD;  Location: PEDS PROCEDURE ROOM [facility];  Service: Gastroenterology    PR UPPER GI ENDOSCOPY,BIOPSY N/A 08/27/2016    Procedure: UGI ENDOSCOPY; WITH BIOPSY, SINGLE OR MULTIPLE;  Surgeon: Annalee Dine Mir, MD;  Location: PEDS PROCEDURE ROOM [facility];  Service: Gastroenterology    PR UPPER GI ENDOSCOPY,BIOPSY N/A 09/02/2017    Procedure: UGI ENDOSCOPY; WITH BIOPSY, SINGLE OR MULTIPLE;  Surgeon: Annalee Dine Mir, MD;  Location: PEDS PROCEDURE ROOM [facility];  Service: Gastroenterology    PR UPPER GI ENDOSCOPY,BIOPSY N/A 03/13/2020    Procedure: UGI ENDOSCOPY; WITH BIOPSY, SINGLE OR MULTIPLE;  Surgeon: Lamar Jayson Ards, MD;  [facility]: [facility] [facility];  Service: Gastroenterology    PR UPPER GI ENDOSCOPY,BIOPSY N/A 09/05/2021    Procedure: UGI ENDOSCOPY; WITH BIOPSY,  SINGLE OR MULTIPLE;  Surgeon: Rosamond Never, MD;  Location: GI PROCEDURES [facility];  Service: Gastroenterology    PR UPPER GI ENDOSCOPY,DIAGNOSIS N/A 01/20/2022    Procedure: UGI ENDO, INCLUDE ESOPHAGUS, STOMACH, & DUODENUM &/OR JEJUNUM; DX W/WO COLLECTION SPECIMN, BY BRUSH OR WASH;  Surgeon: Gwenn Dallas Ruth, MD;  Location: GI PROCEDURES [facility];  Service: Gastroenterology    TUMOR REMOVAL      multiple-head, neck, back, hand, right flank, multiple

## 2023-10-15 NOTE — Unmapped (Signed)
 Hospitalist Daily Progress Note     LOS: 2 days     Assessment/Plan:  Principal Problem:    GI bleed  Active Problems:    [name] (familial adenomatous polyposis)    Intestinal polyps    History of colonic polyps    Syncope    History of colectomy    Lower abdominal pain       [name] [name] is a 23 y.o. female who is presenting to [facility] with GI bleed, in the setting of the following pertinent/contributing co-morbidities: familial polyposis, s/p colectomy, severe Fe deficiency        GI bleed   Illness that poses a threat to life or bodily function without appropriate treatment     Secondary/Additional Active Problems:  Assessment & Plan    GI bleed  Patient with h/o familial polyposis and desmoid tumors and s/p colectomy who presents with several days of BRB in pouch. Hgb stable and patient is hemodynamically stable  -Gi consulted and found small ulcer which was treated.   -  continue IV PPI for now and will dc on po ppi  - Follow-up Hgb daily     Profound Fe-Deficiency Anemia  - Iron  =5, Transferrin = 148, TIBC =186.5, Fe sat= 3  - Will likely benefit from IV Fe and she has had several in past that have been complicated by allergic reaction requiring pre-meds (zyrtec , famotidine  and prednisone ) prior to administration   - Allergy  consulted and will attempt to give tomorrow. Please see FYI tab if needed for pretreatment plan:    IV iron  Transfusion:  - Planned for feraheme  infusion. Recommend using a slow rate of infusion (infuse no more than 0.5g/hr initially).    Pretreatment:  Antihistamines:  - Pre-treat with cetirizine  20mg  BID for full 48 hours prior to infusion as discussed above. Ideally would recommend treating entire week prior to infusion in the future  - On the day of infusion, cetirizine  20 qAM, then IV benadryl  50mg  and famotidine  40 mg IV 30 minutes prior to infusion, then cetirizine  20 mg qPM  - Continue cetirizine  20 mg BID for 72 hours after infusion    Steroids:  - Steroid pretreatment with 50mg  prednisone  13 hours prior to the infusion, 50mg  prednisone  7 hours prior to the infusion, then the solumedrol dose 40mg  (IV dose) prior to the infusion.    Fluids:   - Start IV fluid hydration 30 minutes prior to the infusion    If reaction occurs:  - Recommend benadryl  50mg  or cetirizine  10-20mg  for mild symptoms such as urticaria or pruritus  - Recommend IM epinephrine  for severe reactions (i.e. anaphylaxis)  - Please draw tryptase level if any reaction occurs (ideally between 30-90 min of onset of reaction)       [name] (familial adenomatous polyposis)  - s/p colectomy    Syncope  - likely secondary to vasovagal   - hgb is stable and patient is hemodynamically stable  - patient current asymptomatic     Lower abdominal pain  - discussed outpt provider who encouraged short hospitalization.  - continue butrans  patch 20 mg weekly (ordered today), oxycodone  20 mg po TID PRN, Baclofen  5 mg po tID, Voltaren  gel and Lidocaine  patch.    Desmoid Tumors - involving paraspinal, chest wall, right arm/wrist  - s/p cryoablation with some clinical benefit  - DC nirogacestat  for now per primary oncologist     Prophylaxis  None     Diet  -Nutrition Therapy Clear Liquid  NPO Sips with meds; Procedure/Test     Code Status / HCDM   -Full Code, Discussed with patient at the time of admission   -  HCDM [facility] policy, no known patient preference): Cue,[name] - Mother - [phone]    HCDM, back-up (If primary HCDM is unavailable): Allyiah, Gartner - Father - [phone]        I personally spent 53 minutes face-to-face and non-face-to-face in the care of this patient, which includes all pre, intra, and post visit time on the date of service.  All documented time was specific to the E/M visit and does not include any procedures that may have been performed.      Consultants:   1.  Onc  2. GI  3. Allergy     Pending labs:       Subjective:       Pt reports increased abdominal pain following procedure. Encouraged outpt Iron  transfusion. Pt pt is refusing outpt management. She states she is too much pain. Pt reports syncopal event last night and this morning which was not observed by nursing and only family. We discussed mRI of wrist would have to happen as an outpt.     Pt reports some nausea and we discussed we would restart home phenergan   Objective:     Vital signs in last 24 hours:  Temp:  [35.9 ??C (96.6 ??F)-36.9 ??C (98.4 ??F)] 36.8 ??C (98.2 ??F)  Pulse:  [70-105] 73  SpO2 Pulse:  [65-107] 88  Resp:  [9-24] 24  BP: (93-122)/(39-76) 121/63  MAP (mmHg):  [76-90] 80  SpO2:  [94 %-100 %] 98 %  BMI (Calculated):  [19.8] 19.8    Physical Exam:  General: sitting up in bed,generalized pallor, in good spirits until we talked about DC when she became more tearful  Eyes:anicteric sclerae, no conjunctival irritation  ENT: MMM  Neuro:  EOMI, no facial asymmetry  CV: regular rate and rhythm, normal S1, S2, no murmurs  Pulmonary: lungs are clear, no rales, ronchi, or wheezing  Abdomen: soft, tender to palpation, +BS  Skin: no lesions, rashes  Extremities: no c/c/e  Psych: awake, alert, oriented      Intake/Output last 24 hours:  No intake or output data in the 24 hours ending 10/15/23 1316    Medications:   Scheduled Meds:Scheduled Medications[1]  Continuous Infusions:Infusions Meds[2]    Lab Results   Component Value Date    WBC 5.2 10/15/2023    HGB 8.3 (L) 10/15/2023    HCT 25.2 (L) 10/15/2023    PLT 255 10/15/2023       Lab Results   Component Value Date    NA 143 10/14/2023    K 3.4 10/14/2023    CL 108 (H) 10/14/2023    CO2 28.0 10/14/2023    BUN 6 (L) 10/14/2023    CREATININE 0.54 (L) 10/14/2023    GLU 88 10/14/2023    CALCIUM 8.4 (L) 10/14/2023    MG 2.1 10/06/2023    PHOS 3.9 07/19/2023       Lab Results   Component Value Date    BILITOT 0.2 (L) 10/14/2023    BILIDIR <0.10 03/17/2023    PROT 5.4 (L) 10/14/2023    ALBUMIN 2.9 (L) 10/14/2023    ALT <7 (L) 10/14/2023    AST 12 10/14/2023    ALKPHOS 44 (L) 10/14/2023    GGT 13 10/31/2011 Lab Results   Component Value Date    INR 0.99 04/04/2023    APTT 33.7 03/25/2023             [  1]    baclofen   5 mg Oral TID    buprenorphine   1 patch Transdermal Q7 Days    cetirizine   20 mg Oral BID    escitalopram  oxalate  5 mg Oral Daily    famotidine   20 mg Oral Nightly    pantoprazole  (Protonix ) intravenous solution  40 mg Intravenous BID   [2]    lactated Ringers 

## 2023-10-15 NOTE — Unmapped (Addendum)
 [facility] Consult Service   Treatment Plan         Recommendations:   [name] [name] is a 23 y.o. 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 The patient is seen in consultation at the request of [name], MD (Med [facility] for bleeding.     Assessment: Pouch exam completed 8/6 notable for superficial ulceration around her anastomosis. Treated with APC. This is the most likely etiology of her hematochezia. Patient remained HD stable overnight with stable hemoglobin this morning. From a GI perspective, she is safe to discharge with plan for outpatient IV iron  to avoid a prolonged inpatient stay. I have requested follow up with Dr. Lucian within the next 3 months and she will be contacted to schedule. Please provide GI clinic number in her discharge paperwork: [phone].     Recommendations discussed with the patient's primary team. We will sign off at this time. Thank you for involving us  in this patient's care.

## 2023-10-15 NOTE — Unmapped (Addendum)
 Outpatient follow-up items:  [ ]  Repeat CBC, retics in about 1 month to assess response to IV iron   [ ]  Titrate loperamide  to effect; previously recommended by GI and she had been hesitant to try but took a few doses in hospital with no ill effect  [ ]  Follow up with endocrinology for repeat evaluation of HPA axis  [ ]  Delay MRI right wrist for about 1 month after iron  dextran    Hospital course:  [name] [name] is a 23 y.o. 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    Briefly: She underwent pouch exam 10/14/2023 which noted superficial ulceration around the ileoanal anastomosis which was presumed to be the source of blood loss, and treated with APC. She also received IV iron  (with pre- and post-medication) and IV fluids. Hemoglobin and reticulocyte count began to rise and orthostatic symptoms improved. Hospital course was complicated by flare of chronic abdominal pain and nausea, as frequently occurs during her hospitalizations, but she was discharged without changes to her pain plan. A preliminary diagnosis of secondary adrenal insufficiency was made.    Details below by problem.    # Lower GI bleeding  # Acute on chronic anemia related to GI losses and severe iron  deficiency  Small-volume hematochezia is an intermittent issue for her, generally attributed to either inflammatory changes in the distal neo-ileum or oozing from ulceration around the ileoanal anastomosis. She has chronic anemia (recent baseline Hb ~10-11) related in part to iron  deficiency which in turn is related to chronic GI losses and dietary insufficiency. In this instance, presented with increased volume and frequency of hematochezia associated with decline in Hb 9.7 > 8.0 in first 48 hours of hospital stay. GI service was consulted and performed pouch exam which noted circumferential shallow ulceration around the ileoanal anastomosis, scattered aphthae proximal to the anastomosis, but otherwise healthy appearing mucosa with no active bleeding. Ulcerations treated with APC to reduce risk of bleeding. Severe iron  deficiency was confirmed (serum iron  5, iron  saturation 3%) and a dose of IV iron  was recommended. Because of infusion reactions with multiple different iron  preparations, allergy  service was consulted and recommended premedication with cetirizine  20 mg twice daily for 48 hours prior to infusion and at least 72 hours after; prednisone  50 mg x 2 doses at 13 and 7 hours prior to infusion then methylprednisolone  40 mg IV immediately prior to infusion; and diphenhydramine  50 mg IV, famotidine  40 mg IV 30 minutes prior to infusion. She received iron  dextran 1 g without serious complication and only mild urticaria as breakthrough symptom. Hb rose to 9 by time of discharge. She will continue follow-up as scheduled with outpatient GI, and is anticipated to have ongoing need for IV iron . Please note that she received iron  dextran safely and does not require hospital admission for future IV iron  infusions provided appropriate premedications are available.    # Acute on chronic abdominal pain and nausea  Complex situation discussed at length in prior notes. In brief, her pain and nausea are multifactorial -- presumably due to in part to prior abdominal surgeries and altered motility as well as tethering of the small bowel due to desmoid tumor in the mesentery, but also with substantial functional overlay. Goal is not complete resolution or aggressive  diagnostics but symptomatic treatment and maintenance of function. Outpatient pain regimen includes buprenorphine  patch, baclofen , lidocaine  patches, oxycodone . Pain management was somewhat challenging, and similar to prior admissions, she was treated with hydromorphone  up to 1 mg IV every 4 hours. There was some improvement in her pain by time of discharge, and no new opioids were prescribed. Recommend ongoing follow-up with consistent group of providers including Dr Delores (pain management).    # Orthostatic intolerance  # Recurrent vasovagal syncope  Not a prominent part of her constellation of chronic symptoms until spring 2025, but increasing in frequency over the past month or so. During prior hospital admission, convulsive movements were noted after syncopal events and she was evaluated for possible seizure with MRI and video EEG. Spells were felt to be nonepileptiform. POTS or similar functional orthostatic intolerance was suspected and she was referred to cardiology (08/20/2023). Their opinion was that her orthostatic intolerance and recurrent syncope could be adequately explained by malnutrition, inadequate fluid intake to keep up with stool losses from chronic diarrhea, and deconditioning. She and her mother have noticed that events tend to be more frequent at times of increased pain, anxiety, etc. She receives IV fluids intermittently as an outpatient per a treatment plan from her oncologist. During this hospital stay, she had multiple syncopal events, several of which were captured while on telemetry. No likely significant arrhythmias were noted. We attempted to mitigate her symptoms with generous use of IV fluids and a trial of loperamide  (as previously recommended by multiple members of her outpatient team) to decrease stool losses. She enjoyed considerable symptomatic benefit from IV fluids and although she continued to have some orthostatic dizziness, was able to walk several laps around the unit each day prior to discharge. See below regarding possibility of mild adrenal insufficiency. We discussed conservative treatments for orthostatic intolerance (which would also apply as first-line treatment for POTS) including aggressive hydration with goal of 2 to 3 L/day, increase salt intake, over-the-counter electrolyte supplements and/or sports drinks, abdominal binder, compression stockings, graded increase in physical activity.    # Secondary adrenal insufficiency, mild  As part of evaluation for causes of orthostatic symptoms, checked random morning cortisol which was 0.9. Response to ACTH stimulation was subnormal: 4.1 (0 min) > 12.2 (60 min) > 13.1 (90 min). We note that she received enteral budesonide  for about 6 weeks in May and June 2025. Endocrinology service was consulted and felt that results of ACTH stimulation test, with recent iatrogenic steroid exposure and orthostatic symptoms, did represent mild secondary adrenal insufficiency. We discussed with her and her mother that treatment with basal hydrocortisone  (10 mg daily) may offer some symptomatic benefit but is primarily intended to offer safety during times of physiologic stress. Sick day dosing and emergency injectable hydrocortisone  were not felt to be necessary. She will follow-up with endocrinology for repeat evaluation of the HPA axis to guide discontinuation of physiologic steroids in 6-8 weeks.    # Desmoid tumors  Currently treated with nirogacestat . This agent may cause GI bleeding, so oncology service recommended stopping while inpatient. She will follow-up with her primary oncologist, Dr Loris, to discuss how and when to resume.     # Mood / anxiety  Continued escitalopram . Previous admissions, she benefited from meeting with pain psychology team, massage therapy, activity therapy, etc.

## 2023-10-15 NOTE — Unmapped (Signed)
 Shift Summary  Pain management was challenging, with fluctuations in pain levels despite multiple PRN medications.   Respiratory status remained stable with symmetrical chest expansion and regular, unlabored respiratory patterns.   Family members visited consistently throughout the shift, providing support.   The unplanned readmission score showed a slight decrease, indicating some progress.   Overall, the patient's status showed some improvement in readiness for transition of care.     Optimal Comfort and Wellbeing: Pain in the abdomen fluctuated throughout the shift, initially decreasing after administration of HYDROmorphone  and ondansetron , but later increasing again despite administration of acetaminophen  and oxyCODONE .     Readiness for Transition of Care: The unplanned readmission score decreased slightly over the shift, suggesting some progress towards readiness for transition of care.

## 2023-10-16 MED ADMIN — diphenhydrAMINE (BENADRYL) injection 25 mg: 25 mg | INTRAVENOUS | @ 23:00:00 | Stop: 2023-10-17

## 2023-10-16 MED ADMIN — iron dextran (INFED) 1,000 mg in sodium chloride (NS) 0.9 % 500 mL IVPB: 1000 mg | INTRAVENOUS | @ 17:00:00 | Stop: 2023-10-16

## 2023-10-16 MED ADMIN — pantoprazole (Protonix) injection 40 mg: 40 mg | INTRAVENOUS

## 2023-10-16 MED ADMIN — HYDROmorphone (PF) (DILAUDID) injection 1 mg: 1 mg | INTRAVENOUS | @ 04:00:00 | Stop: 2023-10-27

## 2023-10-16 MED ADMIN — baclofen (LIORESAL) tablet 5 mg: 5 mg | ORAL

## 2023-10-16 MED ADMIN — cetirizine (ZYRTEC) tablet 20 mg: 20 mg | ORAL

## 2023-10-16 MED ADMIN — cetirizine (ZYRTEC) tablet 20 mg: 20 mg | ORAL | @ 14:00:00

## 2023-10-16 MED ADMIN — oxyCODONE (ROXICODONE) 5 mg/5 mL solution 20 mg: 20 mg | ORAL | @ 02:00:00 | Stop: 2023-10-27

## 2023-10-16 MED ADMIN — oxyCODONE (ROXICODONE) 5 mg/5 mL solution 20 mg: 20 mg | ORAL | @ 13:00:00 | Stop: 2023-10-27

## 2023-10-16 MED ADMIN — methylPREDNISolone sodium succinate (SOLU-Medrol) injection 40 mg: 40 mg | INTRAVENOUS | @ 14:00:00 | Stop: 2023-10-16

## 2023-10-16 MED ADMIN — lidocaine (ASPERCREME) 4 % 1 patch: 1 | TRANSDERMAL | @ 13:00:00

## 2023-10-16 MED ADMIN — sodium chloride (NS) 0.9 % infusion: 20 mL/h | INTRAVENOUS | @ 14:00:00 | Stop: 2023-10-16

## 2023-10-16 MED ADMIN — sodium chloride (NS) 0.9 % infusion: 30 mL/h | INTRAVENOUS | @ 17:00:00 | Stop: 2023-10-16

## 2023-10-16 MED ADMIN — baclofen (LIORESAL) tablet 5 mg: 5 mg | ORAL | @ 13:00:00

## 2023-10-16 MED ADMIN — famotidine (PEPCID) tablet 20 mg: 20 mg | ORAL

## 2023-10-16 MED ADMIN — diphenhydrAMINE (BENADRYL) injection 25 mg: 25 mg | INTRAVENOUS | @ 18:00:00 | Stop: 2023-10-17

## 2023-10-16 MED ADMIN — HYDROmorphone (PF) (DILAUDID) injection 1 mg: 1 mg | INTRAVENOUS | @ 15:00:00 | Stop: 2023-10-27

## 2023-10-16 MED ADMIN — acetaminophen (TYLENOL) tablet 1,000 mg: 1000 mg | ORAL | @ 20:00:00

## 2023-10-16 MED ADMIN — acetaminophen (TYLENOL) tablet 1,000 mg: 1000 mg | ORAL | @ 02:00:00

## 2023-10-16 MED ADMIN — pantoprazole (Protonix) injection 40 mg: 40 mg | INTRAVENOUS | @ 13:00:00

## 2023-10-16 MED ADMIN — famotidine (PF) (PEPCID) injection 40 mg: 40 mg | INTRAVENOUS | @ 14:00:00 | Stop: 2023-10-16

## 2023-10-16 MED ADMIN — iron dextran (INFED) 25 mg in sodium chloride (NS) 0.9 % 25 mL IVPB: 25 mg | INTRAVENOUS | @ 15:00:00 | Stop: 2023-10-16

## 2023-10-16 MED ADMIN — HYDROmorphone (PF) (DILAUDID) injection 1 mg: 1 mg | INTRAVENOUS | @ 10:00:00 | Stop: 2023-10-27

## 2023-10-16 MED ADMIN — predniSONE (DELTASONE) tablet 50 mg: 50 mg | ORAL | Stop: 2023-10-15

## 2023-10-16 MED ADMIN — ondansetron (ZOFRAN) injection 4 mg: 4 mg | INTRAVENOUS | @ 22:00:00

## 2023-10-16 MED ADMIN — predniSONE (DELTASONE) tablet 50 mg: 50 mg | ORAL | @ 05:00:00 | Stop: 2023-10-16

## 2023-10-16 MED ADMIN — ondansetron (ZOFRAN) injection 4 mg: 4 mg | INTRAVENOUS | @ 10:00:00

## 2023-10-16 MED ADMIN — oxyCODONE (ROXICODONE) 5 mg/5 mL solution 20 mg: 20 mg | ORAL | @ 19:00:00 | Stop: 2023-10-27

## 2023-10-16 MED ADMIN — diphenhydrAMINE (BENADRYL) injection: 50 mg | INTRAVENOUS | @ 14:00:00 | Stop: 2023-10-16

## 2023-10-16 MED ADMIN — escitalopram oxalate (LEXAPRO) tablet 5 mg: 5 mg | ORAL | @ 13:00:00

## 2023-10-16 MED ADMIN — baclofen (LIORESAL) tablet 5 mg: 5 mg | ORAL | @ 18:00:00

## 2023-10-16 MED ADMIN — HYDROmorphone (PF) (DILAUDID) injection 1 mg: 1 mg | INTRAVENOUS | @ 22:00:00 | Stop: 2023-10-27

## 2023-10-16 NOTE — Unmapped (Signed)
 Pre-meds given per order. PRN dilaudid  and oxycodone  for pain management.    Shift Summary  Pain management interventions were provided, resulting in a reduction in abdominal pain scores from 8 to 5 over the course of the shift.   Vital signs, including blood pressure and heart rate, remained stable within a low to normal range during the shift.   Fall prevention protocols were followed with regular toileting and hourly visual checks, and no falls or injuries occurred.   Cardiac monitoring continued throughout the shift with no acute changes noted in telemetry or rhythm documentation.   Patient remained in bed for the majority of the shift and tolerated interventions without complications; overall, the shift was stable with improvement in pain control.    Absence of Fall and Fall-Related Injury: Hourly visual checks and scheduled toileting were consistently performed throughout the shift, and no safety devices were required for transfers; patient remained in bed and awake during most checks, with no reported falls or injuries.    Anemia Symptom Improvement: Blood pressure and heart rate remained within a low to normal range, and pain related to the abdomen decreased after administration of pain medications, with pain scores dropping from 8 to 5 during the shift.

## 2023-10-16 NOTE — Unmapped (Signed)
 Daily Progress Note    Assessment/Plan:    [name] [name] is a 23 y.o. female with familial polyposis (s/p colectomy) admitted with GI bleed    GI bleed  Patient with h/o familial polyposis and desmoid tumors and s/p colectomy who presents with several days of BRB in pouch. Hgb stable and patient is hemodynamically stable. GI consulted and found small ulcer which was treated. Hgb has remained stable   - Continue IV PPI for now and will dc on po ppi  - Follow-up Hgb daily     Profound Fe-Deficiency Anemia    Iron  =5, Transferrin = 148, TIBC =186.5, Fe sat= 3  Will likely benefit from IV Fe and she has had several in past that have been complicated by allergic reaction requiring pre-meds (zyrtec , famotidine  and prednisone ) prior to administration   - Allergy  consulted and will attempt to give IV Dextran today with pretreatment     [name] (familial adenomatous polyposis)  - s/p colectomy      Lower abdominal pain  Ongoing issues and requesting to maintain her current pain medication schedule  - continue butrans  patch 20 mg weekly , oxycodone  20 mg po TID PRN, Baclofen  5 mg po tID,   Voltaren  gel and Lidocaine  patch  -Plan to wean off IV dilaudid  tomorrow      Desmoid Tumors - involving paraspinal, chest wall, right arm/wrist  - s/p cryoablation with some clinical benefit  - DC nirogacestat  for now per primary oncologist    Disposition: Home   Medically Clear: No, Possibly tomorrow     DVT Prophylaxis  Med Administrations and Associated Flowsheet Values (last 24 hours)       None          Peripheral Vascular Interventions (last 24 hours):        I personally spent greater than 55 minutes face-to-face and non-face-to-face in the care of this patient, which includes all pre, intra, and post visit time on the date of service.  All documented time was specific to the E/M visit and does not include any procedures that may have been performed.  __________________________________________________________________    Subjective:    No acute events overnight. Reports ongoing significant pain in her back and abdomen . We discussed the plan for the IV Iron  today and questions answered. No new symptoms other than some lightheadedness. Had an episode of tachycardia last night     Objective    Temp:  [36.4 ??C (97.5 ??F)-37 ??C (98.6 ??F)] 36.4 ??C (97.5 ??F)  Pulse:  [55-81] 72  Resp:  [18-22] 18  BP: (98-128)/(50-72) 101/51  SpO2:  [97 %-100 %] 99 %    Chronically ill female sitting upright in bed in NAD, pleasant  MMM  RRR no murmurs   Lungs CTA bilaterally with normal WOB   Abdomen soft, nontender and hypoactive bowel sounds   No LE edema

## 2023-10-16 NOTE — Unmapped (Signed)
 Shift Summary  Iron  dextran infusions and supportive medications were administered to address anemia symptoms, with close monitoring of blood pressure and MAP throughout the shift.  Pain management included heat application and PRN analgesics, resulting in temporary pain relief  but with recurring discomfort later in the day.  Fall prevention measures were maintained, including regular toileting and hourly checks, with no falls reported.  No new hospital-acquired injuries or safety concerns were documented, and safety devices were consistently used as appropriate.  Overall, interventions for anemia and pain were provided, and safety protocols were followed throughout the shift.    Absence of Hospital-Acquired Illness or Injury: No new hospital-acquired injuries documented and safety interventions such as side rails and alarms were maintained throughout the shift.    Optimal Comfort and Wellbeing: Abdominal pain fluctuated but was managed with heat application and PRN pain medications, with pain scores decreasing after interventions but returning to higher levels later in the shift.    Absence of Fall and Fall-Related Injury: Fall prevention strategies including regular toileting, hourly visual checks, and bed safety were consistently implemented, and no falls occurred during the shift.    Anemia Symptom Improvement: Iron  dextran infusions were administered and sodium chloride  was given, with vital signs monitored closely; blood pressure and MAP decreased midday but improved later in the shift.

## 2023-10-17 MED ADMIN — pantoprazole (Protonix) injection 40 mg: 40 mg | INTRAVENOUS | @ 13:00:00

## 2023-10-17 MED ADMIN — diphenhydrAMINE (BENADRYL) injection 25 mg: 25 mg | INTRAVENOUS | @ 09:00:00 | Stop: 2023-10-17

## 2023-10-17 MED ADMIN — lidocaine (ASPERCREME) 4 % 1 patch: 1 | TRANSDERMAL | @ 13:00:00

## 2023-10-17 MED ADMIN — oxyCODONE (ROXICODONE) 5 mg/5 mL solution 20 mg: 20 mg | ORAL | @ 11:00:00 | Stop: 2023-10-27

## 2023-10-17 MED ADMIN — cetirizine (ZYRTEC) tablet 20 mg: 20 mg | ORAL

## 2023-10-17 MED ADMIN — HYDROmorphone (DILAUDID) injection Syrg 0.5 mg: .5 mg | INTRAVENOUS | @ 15:00:00 | Stop: 2023-10-19

## 2023-10-17 MED ADMIN — HYDROmorphone (DILAUDID) injection Syrg 0.5 mg: .5 mg | INTRAVENOUS | @ 22:00:00 | Stop: 2023-10-19

## 2023-10-17 MED ADMIN — acetaminophen (TYLENOL) tablet 1,000 mg: 1000 mg | ORAL | @ 05:00:00

## 2023-10-17 MED ADMIN — diphenhydrAMINE (BENADRYL) injection 25 mg: 25 mg | INTRAVENOUS | @ 13:00:00 | Stop: 2023-10-17

## 2023-10-17 MED ADMIN — HYDROmorphone (PF) (DILAUDID) injection 1 mg: 1 mg | INTRAVENOUS | @ 03:00:00 | Stop: 2023-10-27

## 2023-10-17 MED ADMIN — HYDROmorphone (PF) (DILAUDID) injection 1 mg: 1 mg | INTRAVENOUS | @ 09:00:00 | Stop: 2023-10-17

## 2023-10-17 MED ADMIN — baclofen (LIORESAL) tablet 5 mg: 5 mg | ORAL

## 2023-10-17 MED ADMIN — baclofen (LIORESAL) tablet 5 mg: 5 mg | ORAL | @ 18:00:00

## 2023-10-17 MED ADMIN — ondansetron (ZOFRAN) injection 4 mg: 4 mg | INTRAVENOUS | @ 11:00:00

## 2023-10-17 MED ADMIN — escitalopram oxalate (LEXAPRO) tablet 5 mg: 5 mg | ORAL | @ 13:00:00

## 2023-10-17 MED ADMIN — ondansetron (ZOFRAN) injection 4 mg: 4 mg | INTRAVENOUS | @ 05:00:00

## 2023-10-17 MED ADMIN — cetirizine (ZYRTEC) tablet 20 mg: 20 mg | ORAL | @ 13:00:00

## 2023-10-17 MED ADMIN — oxyCODONE (ROXICODONE) 5 mg/5 mL solution 20 mg: 20 mg | ORAL | Stop: 2023-10-27

## 2023-10-17 MED ADMIN — baclofen (LIORESAL) tablet 5 mg: 5 mg | ORAL | @ 13:00:00

## 2023-10-17 MED ADMIN — oxyCODONE (ROXICODONE) 5 mg/5 mL solution 20 mg: 20 mg | ORAL | @ 05:00:00 | Stop: 2023-10-27

## 2023-10-17 MED ADMIN — diphenhydrAMINE (BENADRYL) injection 25 mg: 25 mg | INTRAVENOUS | @ 03:00:00 | Stop: 2023-10-17

## 2023-10-17 MED ADMIN — acetaminophen (TYLENOL) tablet 1,000 mg: 1000 mg | ORAL | @ 17:00:00

## 2023-10-17 MED ADMIN — oxyCODONE (ROXICODONE) 5 mg/5 mL solution 20 mg: 20 mg | ORAL | @ 16:00:00 | Stop: 2023-10-27

## 2023-10-17 MED ADMIN — famotidine (PEPCID) tablet 20 mg: 20 mg | ORAL

## 2023-10-17 MED ADMIN — famotidine (PF) (PEPCID) injection 20 mg: 20 mg | INTRAVENOUS | @ 22:00:00

## 2023-10-17 MED ADMIN — pantoprazole (Protonix) injection 40 mg: 40 mg | INTRAVENOUS

## 2023-10-17 NOTE — Unmapped (Signed)
 Daily Progress Note    Assessment/Plan:    [name] [name] is a 23 y.o. female with familial polyposis (s/p colectomy) admitted with GI bleed    GI bleed  Patient with h/o familial polyposis and desmoid tumors and s/p colectomy who presents with several days of BRB in pouch. Hgb stable and patient is hemodynamically stable. GI consulted and found small ulcer which was treated. Hgb has remained stable   - Continue IV PPI for now and will dc on po ppi  - Follow-up Hgb daily     Profound Fe-Deficiency Anemia    Iron  =5, Transferrin = 148, TIBC =186.5, Fe sat= 3  Tolerated IV Dextran although still reporting some flushing and intermittent rash   -CTM for breakthrough symptoms    [name] (familial adenomatous polyposis)  - s/p colectomy      Lower abdominal pain  Ongoing issues and requesting to maintain her current pain medication schedule  - continue butrans  patch 20 mg weekly , oxycodone  20 mg po TID PRN, Baclofen  5 mg po tID,   Voltaren  gel and Lidocaine  patch  -Weaning off IV dilaudid       Desmoid Tumors - involving paraspinal, chest wall, right arm/wrist  - s/p cryoablation with some clinical benefit  - DC nirogacestat  for now per primary oncologist    Disposition: Home   Medically Clear: No, Possibly tomorrow     DVT Prophylaxis  Med Administrations and Associated Flowsheet Values (last 24 hours)       None          Peripheral Vascular Interventions (last 24 hours):        I personally spent greater than 55 minutes face-to-face and non-face-to-face in the care of this patient, which includes all pre, intra, and post visit time on the date of service.  All documented time was specific to the E/M visit and does not include any procedures that may have been performed.  __________________________________________________________________    Subjective:    No acute events overnight. Tolerated IV iron  yesterday without significant reaction although complaining of itching and some redness this morning. Still reports some pain around her pouch site. We discussed weaning pain medications today. She understands the need to be off IV meds to go home although is still anxious about weaning    Objective    Temp:  [36.3 ??C (97.3 ??F)-37 ??C (98.6 ??F)] 36.7 ??C (98.1 ??F)  Pulse:  [56-77] 67  Resp:  [18-20] 18  BP: (98-114)/(52-71) 98/52  SpO2:  [95 %-100 %] 95 %    Chronically ill female sitting upright in bed in NAD, anxious and tearful   MMM  RRR no murmurs   Lungs CTA bilaterally with normal WOB   Abdomen soft, nontender and hypoactive bowel sounds   No LE edema

## 2023-10-17 NOTE — Unmapped (Signed)
 Shift Summary  Pain escalated mid-shift and was managed with HYDROmorphone  and oxyCODONE , resulting in temporary pain relief .   Blood pressure and pulse were low in the early afternoon but improved later in the shift.   Fall prevention strategies were maintained, including frequent checks and safety interventions, with no falls reported.   Cardiac surveillance and telemetry monitoring were continued throughout the shift.   Overall, pain and hemodynamic status fluctuated but were addressed with appropriate interventions.     Absence of Fall and Fall-Related Injury: Fall prevention interventions were consistently maintained throughout the shift, including frequent visual checks, family presence, and use of nonskid footwear; no falls or injuries were documented.     Anemia Symptom Improvement: Blood pressure and pulse trended lower in the early afternoon but improved later in the shift; pain increased mid-shift but was managed with HYDROmorphone  and oxyCODONE , resulting in temporary pain reduction.

## 2023-10-17 NOTE — Unmapped (Addendum)
 Patient tolerated completion of iron  infusion; reported continued redness to face and cheeks, improved with PRN benadryl . Pain controlled with dilaudid  and oxycodone . Safety precautions in place. Call light and belongings at bedside and within reach.

## 2023-10-18 LAB — COMPREHENSIVE METABOLIC PANEL
ALBUMIN: 3.1 g/dL — ABNORMAL LOW (ref 3.4–5.0)
ALKALINE PHOSPHATASE: 49 U/L (ref 46–116)
ALT (SGPT): 13 U/L (ref 10–49)
ANION GAP: 6 mmol/L (ref 5–14)
AST (SGOT): <8 U/L (ref <=34)
BILIRUBIN TOTAL: 0.2 mg/dL — ABNORMAL LOW (ref 0.3–1.2)
BLOOD UREA NITROGEN: 7 mg/dL — ABNORMAL LOW (ref 9–23)
BUN / CREAT RATIO: 13
CALCIUM: 9.1 mg/dL (ref 8.7–10.4)
CHLORIDE: 109 mmol/L — ABNORMAL HIGH (ref 98–107)
CO2: 29 mmol/L (ref 20.0–31.0)
CREATININE: 0.54 mg/dL — ABNORMAL LOW (ref 0.55–1.02)
EGFR CKD-EPI (2021) FEMALE: >90 mL/min/1.73m2 (ref >=60)
GLUCOSE RANDOM: 91 mg/dL (ref 70–179)
POTASSIUM: 3.8 mmol/L (ref 3.4–4.8)
PROTEIN TOTAL: 5.5 g/dL — ABNORMAL LOW (ref 5.7–8.2)
SODIUM: 144 mmol/L (ref 135–145)

## 2023-10-18 LAB — CBC W/ AUTO DIFF
BASOPHILS ABSOLUTE COUNT: 0 10*9/L (ref 0.0–0.1)
BASOPHILS RELATIVE PERCENT: 0.6 %
EOSINOPHILS ABSOLUTE COUNT: 0.1 10*9/L (ref 0.0–0.5)
EOSINOPHILS RELATIVE PERCENT: 2 %
HEMATOCRIT: 24.7 % — ABNORMAL LOW (ref 34.0–44.0)
HEMOGLOBIN: 8.1 g/dL — ABNORMAL LOW (ref 11.3–14.9)
LYMPHOCYTES ABSOLUTE COUNT: 2.9 10*9/L (ref 1.1–3.6)
LYMPHOCYTES RELATIVE PERCENT: 40 %
MEAN CORPUSCULAR HEMOGLOBIN CONC: 33 g/dL (ref 32.0–36.0)
MEAN CORPUSCULAR HEMOGLOBIN: 25.1 pg — ABNORMAL LOW (ref 25.9–32.4)
MEAN CORPUSCULAR VOLUME: 76.1 fL — ABNORMAL LOW (ref 77.6–95.7)
MEAN PLATELET VOLUME: 9.7 fL (ref 6.8–10.7)
MONOCYTES ABSOLUTE COUNT: 1 10*9/L — ABNORMAL HIGH (ref 0.3–0.8)
MONOCYTES RELATIVE PERCENT: 14 %
NEUTROPHILS ABSOLUTE COUNT: 3.2 10*9/L (ref 1.8–7.8)
NEUTROPHILS RELATIVE PERCENT: 43.4 %
PLATELET COUNT: 241 10*9/L (ref 150–450)
RED BLOOD CELL COUNT: 3.24 10*12/L — ABNORMAL LOW (ref 3.95–5.13)
RED CELL DISTRIBUTION WIDTH: 13.2 % (ref 12.2–15.2)
WBC ADJUSTED: 7.3 10*9/L (ref 3.6–11.2)

## 2023-10-18 LAB — CBC
HEMATOCRIT: 24.3 % — ABNORMAL LOW (ref 34.0–44.0)
HEMOGLOBIN: 7.9 g/dL — ABNORMAL LOW (ref 11.3–14.9)
MEAN CORPUSCULAR HEMOGLOBIN CONC: 32.7 g/dL (ref 32.0–36.0)
MEAN CORPUSCULAR HEMOGLOBIN: 24.8 pg — ABNORMAL LOW (ref 25.9–32.4)
MEAN CORPUSCULAR VOLUME: 75.6 fL — ABNORMAL LOW (ref 77.6–95.7)
MEAN PLATELET VOLUME: 9.8 fL (ref 6.8–10.7)
PLATELET COUNT: 242 10*9/L (ref 150–450)
RED BLOOD CELL COUNT: 3.21 10*12/L — ABNORMAL LOW (ref 3.95–5.13)
RED CELL DISTRIBUTION WIDTH: 13.4 % (ref 12.2–15.2)
WBC ADJUSTED: 7.6 10*9/L (ref 3.6–11.2)

## 2023-10-18 LAB — HEMOGLOBIN AND HEMATOCRIT, BLOOD
HEMATOCRIT: 24.9 % — ABNORMAL LOW (ref 34.0–44.0)
HEMOGLOBIN: 8.2 g/dL — ABNORMAL LOW (ref 11.3–14.9)

## 2023-10-18 MED ADMIN — HYDROmorphone (PF) (DILAUDID) injection 1 mg: 1 mg | INTRAVENOUS | @ 21:00:00 | Stop: 2023-10-20

## 2023-10-18 MED ADMIN — HYDROmorphone (PF) (DILAUDID) injection 1 mg: 1 mg | INTRAVENOUS | @ 12:00:00 | Stop: 2023-10-20

## 2023-10-18 MED ADMIN — oxyCODONE (ROXICODONE) 5 mg/5 mL solution 20 mg: 20 mg | ORAL | Stop: 2023-10-27

## 2023-10-18 MED ADMIN — cetirizine (ZYRTEC) tablet 20 mg: 20 mg | ORAL | @ 12:00:00

## 2023-10-18 MED ADMIN — baclofen (LIORESAL) tablet 5 mg: 5 mg | ORAL

## 2023-10-18 MED ADMIN — lidocaine (ASPERCREME) 4 % 1 patch: 1 | TRANSDERMAL | @ 12:00:00

## 2023-10-18 MED ADMIN — baclofen (LIORESAL) tablet 5 mg: 5 mg | ORAL | @ 12:00:00

## 2023-10-18 MED ADMIN — escitalopram oxalate (LEXAPRO) tablet 5 mg: 5 mg | ORAL | @ 12:00:00

## 2023-10-18 MED ADMIN — oxyCODONE (ROXICODONE) 5 mg/5 mL solution 20 mg: 20 mg | ORAL | @ 05:00:00 | Stop: 2023-10-27

## 2023-10-18 MED ADMIN — HYDROmorphone (DILAUDID) injection Syrg 0.5 mg: .5 mg | INTRAVENOUS | @ 11:00:00 | Stop: 2023-10-18

## 2023-10-18 MED ADMIN — HYDROmorphone (DILAUDID) injection Syrg 0.5 mg: .5 mg | INTRAVENOUS | @ 03:00:00 | Stop: 2023-10-19

## 2023-10-18 MED ADMIN — cetirizine (ZYRTEC) tablet 20 mg: 20 mg | ORAL

## 2023-10-18 MED ADMIN — promethazine (PHENERGAN) oral syrup: 25 mg | ORAL | @ 18:00:00 | Stop: 2023-10-18

## 2023-10-18 MED ADMIN — oxyCODONE (ROXICODONE) 5 mg/5 mL solution 20 mg: 20 mg | ORAL | @ 10:00:00 | Stop: 2023-10-27

## 2023-10-18 MED ADMIN — HYDROmorphone (DILAUDID) injection Syrg 0.5 mg: .5 mg | INTRAVENOUS | @ 08:00:00 | Stop: 2023-10-18

## 2023-10-18 MED ADMIN — ondansetron (ZOFRAN) injection 4 mg: 4 mg | INTRAVENOUS | @ 14:00:00

## 2023-10-18 MED ADMIN — acetaminophen (TYLENOL) tablet 1,000 mg: 1000 mg | ORAL | @ 05:00:00

## 2023-10-18 MED ADMIN — acetaminophen (TYLENOL) tablet 1,000 mg: 1000 mg | ORAL | @ 15:00:00

## 2023-10-18 MED ADMIN — HYDROmorphone (PF) (DILAUDID) injection 1 mg: 1 mg | INTRAVENOUS | @ 18:00:00 | Stop: 2023-10-20

## 2023-10-18 MED ADMIN — ondansetron (ZOFRAN) injection 4 mg: 4 mg | INTRAVENOUS

## 2023-10-18 MED ADMIN — oxyCODONE (ROXICODONE) immediate release tablet 20 mg: 20 mg | ORAL | @ 15:00:00 | Stop: 2023-10-18

## 2023-10-18 MED ADMIN — LORazepam (ATIVAN) tablet 0.5 mg: .5 mg | ORAL | @ 14:00:00

## 2023-10-18 MED ADMIN — diphenhydrAMINE (BENADRYL) capsule/tablet 50 mg: 50 mg | ORAL | @ 03:00:00

## 2023-10-18 MED ADMIN — ondansetron (ZOFRAN) injection 4 mg: 4 mg | INTRAVENOUS | @ 08:00:00

## 2023-10-18 MED ADMIN — sodium chloride 0.9% (NS) bolus 1,000 mL: 1000 mL | INTRAVENOUS | @ 14:00:00 | Stop: 2023-10-18

## 2023-10-18 MED ADMIN — oxyCODONE (ROXICODONE) 5 mg/5 mL solution 20 mg: 20 mg | ORAL | @ 18:00:00 | Stop: 2023-10-27

## 2023-10-18 MED ADMIN — pantoprazole (Protonix) injection 40 mg: 40 mg | INTRAVENOUS | @ 12:00:00

## 2023-10-18 MED ADMIN — baclofen (LIORESAL) tablet 5 mg: 5 mg | ORAL | @ 18:00:00

## 2023-10-18 MED ADMIN — famotidine (PEPCID) tablet 20 mg: 20 mg | ORAL

## 2023-10-18 MED ADMIN — lactated Ringers infusion: 100 mL/h | INTRAVENOUS | @ 04:00:00 | Stop: 2023-10-18

## 2023-10-18 MED ADMIN — scopolamine (TRANSDERM-SCOP) 1 mg over 3 days topical patch 1 mg: 1 | TOPICAL | @ 18:00:00

## 2023-10-18 MED ADMIN — pantoprazole (Protonix) injection 40 mg: 40 mg | INTRAVENOUS

## 2023-10-18 MED ADMIN — oxyCODONE (ROXICODONE) 5 mg/5 mL solution 20 mg: 20 mg | ORAL | @ 23:00:00 | Stop: 2023-10-27

## 2023-10-18 NOTE — Unmapped (Signed)
 [facility]   Progress Note         Assessment and Recommendations:   [name] [name] is a 23 y.o. female with a PMHx of [name] status post proctocolectomy with ileoanal anastomosis in 2022, desmoid tumors, anemia, previously on TPN, chronic pain, PTSD who presented to [facility] with GI bleeding. The patient is seen in consultation at the request of Lynwood Sammie Gosling, MD ([facility])) for BRBPR.    Assessment: 23 year old woman well-known to our service here with GI bleeding.  She has chronic iron  deficiency anemia from intermittent oozing from anastomotic ulceration in her IPAA. We have evaluated her multiple times with a pouchoscopy, most recently 10/14/23 and is s/p APC. We again discussed that this is unfortunately not definitive therapy for the ulceration and that she will continue to have oozing which will contribute to her ongoing anemia. Fortunately her hgb has remained relatively stable since admission. Would recommend trending daily unless acute worsening. Her episodes of syncope are unlikely related to blood loss given stable anemia. Agree with completion of IV iron  for her severe iron  deficiency. We discussed dietary strategies to improve stool consistency. We discussed continuing to work with primary team on pain control and that worsening symptoms are likely DBGI triggered by stress of hospitalization or procedure. She is planning on seeing [facility] for double-balloon enteroscopy to ensure she has no more proximal source of bleeding which is reasonable.      Recommendations:  - Agree with pain management per primary team  - Agree with iron  repletion per primary team  - Would recommend CBC daily if no evidence of worsening GI blood loss  - Discussed dietary strategies including increased fiber and marshmallows to improve stool consistency  - Follow up as outpatient with Dr. Lucian and for initial consultation with [facility] for DBE    Recommendations discussed with the patient's primary team. We will sign-off at this time, please re-contact if additional questions or a new need for consultation arises.    Subjective:   2x RRT today for syncopal events.   Continues to have BRBPR however Hgb stable at 8.2    Objective:   Temp:  [36.6 ??C (97.9 ??F)-37 ??C (98.6 ??F)] 36.9 ??C (98.4 ??F)  Pulse:  [59-98] 59  Resp:  [18-20] 18  BP: (86-122)/(41-63) 114/63  SpO2:  [95 %-100 %] 100 %    Gen: WDWN female in NAD, answers questions appropriately    Pertinent Labs & Studies:  -I have reviewed the patient's labs from 10/18/23 which show stable Hgb.

## 2023-10-18 NOTE — Unmapped (Signed)
 Daily Progress Note    Assessment/Plan:    [name] [name] is a 23 y.o. female with familial polyposis (s/p colectomy) admitted with GI bleed    GI bleed  Patient with h/o familial polyposis and desmoid tumors and s/p colectomy who presented with several days of BRB in pouch. GI consulted and found small ulcer which was treated. As of 8/10 reporting more BRBPR with stools although hemoglobin has remained stable  -Continue IV PPI for now and will dc on po ppi  - GI re consulted with new bleeding      Profound Fe-Deficiency Anemia   Tolerated IV Dextran although still reporting some flushing and intermittent rash   -CTM for breakthrough symptoms  -Avoid IV benadryl  unless severe reactions    Vagal episodes  At baseline per mother has 2-3 passing out episodes per week. Experience episode on 8/10 although returned to baseline quickly . BP soft ( near baseline) and intermittently tachycardic   -Continue to monitor on tele  -GI work up as above     Lower abdominal pain  Ongoing issues and requesting to maintain her current pain medication schedule  - continue butrans  patch 20 mg weekly , oxycodone  20 mg po TID PRN, Baclofen  5 mg po tID,   Voltaren  gel and Lidocaine  patch  -Weaning off IV dilaudid  as tolerated     [name] (familial adenomatous polyposis)  - s/p colectomy      Desmoid Tumors - involving paraspinal, chest wall, right arm/wrist  - s/p cryoablation with some clinical benefit  - DC nirogacestat  for now per primary oncologist    Disposition: Home   Medically Clear: No, Possibly tomorrow     DVT Prophylaxis  Med Administrations and Associated Flowsheet Values (last 24 hours)       None          Peripheral Vascular Interventions (last 24 hours):        I personally spent greater than 55 minutes face-to-face and non-face-to-face in the care of this patient, which includes all pre, intra, and post visit time on the date of service.  All documented time was specific to the E/M visit and does not include any procedures that may have been performed.  __________________________________________________________________    Subjective:    RR called early in morning after patient had bleeding episode and worsening abdominal pain . Received 1 liter NS and BP has remained stable as well as Hgb. Additional RR called in afternoon after vagal episode although returned to baseline . She is concerned about worsening abdominal pain and return of some BRBPR.    Objective    Temp:  [36.6 ??C (97.9 ??F)-37.1 ??C (98.8 ??F)] 36.9 ??C (98.4 ??F)  Pulse:  [59-98] 59  Resp:  [18-20] 18  BP: (86-122)/(41-83) 114/63  SpO2:  [95 %-100 %] 100 %    Chronically ill female sitting upright in bed in NAD, anxious and tearful   MMM  RRR no murmurs   Lungs CTA bilaterally with normal WOB   Abdomen soft, protuberant and moderate periumbilical pain. No rebound or guarding  No LE edema

## 2023-10-19 LAB — CBC
HEMATOCRIT: 26.9 % — ABNORMAL LOW (ref 34.0–44.0)
HEMOGLOBIN: 8.8 g/dL — ABNORMAL LOW (ref 11.3–14.9)
MEAN CORPUSCULAR HEMOGLOBIN CONC: 32.6 g/dL (ref 32.0–36.0)
MEAN CORPUSCULAR HEMOGLOBIN: 24.8 pg — ABNORMAL LOW (ref 25.9–32.4)
MEAN CORPUSCULAR VOLUME: 76.2 fL — ABNORMAL LOW (ref 77.6–95.7)
MEAN PLATELET VOLUME: 10 fL (ref 6.8–10.7)
PLATELET COUNT: 264 10*9/L (ref 150–450)
RED BLOOD CELL COUNT: 3.53 10*12/L — ABNORMAL LOW (ref 3.95–5.13)
RED CELL DISTRIBUTION WIDTH: 13.6 % (ref 12.2–15.2)
WBC ADJUSTED: 7.2 10*9/L (ref 3.6–11.2)

## 2023-10-19 LAB — BASIC METABOLIC PANEL
ANION GAP: 7 mmol/L (ref 5–14)
BLOOD UREA NITROGEN: 6 mg/dL — ABNORMAL LOW (ref 9–23)
BUN / CREAT RATIO: 11
CALCIUM: 9.1 mg/dL (ref 8.7–10.4)
CHLORIDE: 108 mmol/L — ABNORMAL HIGH (ref 98–107)
CO2: 29 mmol/L (ref 20.0–31.0)
CREATININE: 0.54 mg/dL — ABNORMAL LOW (ref 0.55–1.02)
EGFR CKD-EPI (2021) FEMALE: >90 mL/min/1.73m2 (ref >=60)
GLUCOSE RANDOM: 112 mg/dL (ref 70–179)
POTASSIUM: 3.7 mmol/L (ref 3.4–4.8)
SODIUM: 144 mmol/L (ref 135–145)

## 2023-10-19 LAB — RETICULOCYTES
RETICULOCYTE ABSOLUTE COUNT: 82.1 10*9/L (ref 23.0–100.0)
RETICULOCYTE COUNT PCT: 2.3 % — ABNORMAL HIGH (ref 0.50–2.17)

## 2023-10-19 MED ADMIN — cetirizine (ZYRTEC) tablet 20 mg: 20 mg | ORAL | @ 12:00:00

## 2023-10-19 MED ADMIN — butalbital-acetaminophen-caffeine (ESGIC) per tablet 1 tablet: 1 | ORAL | @ 03:00:00

## 2023-10-19 MED ADMIN — HYDROmorphone (PF) (DILAUDID) injection 1 mg: 1 mg | INTRAVENOUS | @ 17:00:00 | Stop: 2023-10-20

## 2023-10-19 MED ADMIN — famotidine (PEPCID) tablet 20 mg: 20 mg | ORAL | @ 01:00:00

## 2023-10-19 MED ADMIN — HYDROmorphone (PF) (DILAUDID) injection 1 mg: 1 mg | INTRAVENOUS | @ 22:00:00 | Stop: 2023-10-20

## 2023-10-19 MED ADMIN — oxyCODONE (ROXICODONE) 5 mg/5 mL solution 20 mg: 20 mg | ORAL | @ 03:00:00 | Stop: 2023-10-27

## 2023-10-19 MED ADMIN — lidocaine (ASPERCREME) 4 % 1 patch: 1 | TRANSDERMAL | @ 12:00:00

## 2023-10-19 MED ADMIN — oxyCODONE (ROXICODONE) 5 mg/5 mL solution 20 mg: 20 mg | ORAL | @ 12:00:00 | Stop: 2023-10-27

## 2023-10-19 MED ADMIN — HYDROmorphone (PF) (DILAUDID) injection 1 mg: 1 mg | INTRAVENOUS | @ 13:00:00 | Stop: 2023-10-20

## 2023-10-19 MED ADMIN — escitalopram oxalate (LEXAPRO) tablet 5 mg: 5 mg | ORAL | @ 12:00:00

## 2023-10-19 MED ADMIN — acetaminophen (TYLENOL) tablet 1,000 mg: 1000 mg | ORAL | @ 12:00:00

## 2023-10-19 MED ADMIN — baclofen (LIORESAL) tablet 5 mg: 5 mg | ORAL | @ 01:00:00

## 2023-10-19 MED ADMIN — baclofen (LIORESAL) tablet 5 mg: 5 mg | ORAL | @ 12:00:00

## 2023-10-19 MED ADMIN — lactated ringers bolus 1,000 mL: 1000 mL | INTRAVENOUS | @ 17:00:00 | Stop: 2023-10-19

## 2023-10-19 MED ADMIN — cetirizine (ZYRTEC) tablet 20 mg: 20 mg | ORAL | @ 01:00:00

## 2023-10-19 MED ADMIN — loperamide (IMODIUM) capsule 2 mg: 2 mg | ORAL | @ 21:00:00

## 2023-10-19 MED ADMIN — ondansetron (ZOFRAN) injection 4 mg: 4 mg | INTRAVENOUS | @ 01:00:00

## 2023-10-19 MED ADMIN — pantoprazole (Protonix) injection 40 mg: 40 mg | INTRAVENOUS | @ 01:00:00

## 2023-10-19 MED ADMIN — oxyCODONE (ROXICODONE) 5 mg/5 mL solution 20 mg: 20 mg | ORAL | @ 21:00:00 | Stop: 2023-10-27

## 2023-10-19 MED ADMIN — HYDROmorphone (PF) (DILAUDID) injection 1 mg: 1 mg | INTRAVENOUS | @ 09:00:00 | Stop: 2023-10-19

## 2023-10-19 MED ADMIN — HYDROmorphone (PF) (DILAUDID) injection 1 mg: 1 mg | INTRAVENOUS | @ 01:00:00 | Stop: 2023-10-20

## 2023-10-19 MED ADMIN — promethazine (PHENERGAN) tablet 12.5 mg: 12.5 mg | ORAL | @ 22:00:00

## 2023-10-19 MED ADMIN — ondansetron (ZOFRAN) injection 4 mg: 4 mg | INTRAVENOUS | @ 17:00:00

## 2023-10-19 MED ADMIN — baclofen (LIORESAL) tablet 5 mg: 5 mg | ORAL | @ 17:00:00

## 2023-10-19 MED ADMIN — pantoprazole (Protonix) injection 40 mg: 40 mg | INTRAVENOUS | @ 12:00:00

## 2023-10-19 MED ADMIN — oxyCODONE (ROXICODONE) 5 mg/5 mL solution 20 mg: 20 mg | ORAL | @ 17:00:00 | Stop: 2023-10-27

## 2023-10-19 MED ADMIN — ondansetron (ZOFRAN) injection 4 mg: 4 mg | INTRAVENOUS | @ 09:00:00

## 2023-10-19 NOTE — Unmapped (Signed)
 Hospital Medicine Daily Progress Note    Assessment/Plan:    Principal Problem:    GI bleed  Active Problems:    [name] (familial adenomatous polyposis)    Intestinal polyps    History of colonic polyps    Syncope    History of colectomy    Lower abdominal pain        Please see wound care flowsheets for wound documentation     LOS: 6 days (under inpatient status)    [name] [name] is a 23 y.o. 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 In brief, her pain and nausea are multifactorial and goal is not complete resolution or aggressive diagnostics but symptomatic treatment and maintenance of function  -- NO CHANGES to pain regimen without multiparty discussion including patient, unless true emergency  -- for pain  -- oxycodone  (liquid) 20 mg q4 prn  -- hydromorphone  1 mg IV q4 prn   -- buprenorphine  20 mcg/h patch   -- baclofen  5 mg tid  -- for nausea  -- ondansetron  IV prn  -- promethazine  po prn  -- scopolamine  patch  -- NO diphenhydramine  IV in absence of clear indication  -- for dyspepsia  -- famotidine  qhs  -- PPI IV q12  -- for motility  -- naloxegol  prn  -- try loperamide  daily + prn; okay to titrate    # Orthostatic intolerance  # Recurrent vasovagal syncope  Remains symptomatic with OOB activity. Telemetry reviewed including around time of rapid response for syncope; no arrhythmias. Based on chart review and my discussion with her and mom, seems like POTS-spectrum and certainly exacerbated by pain, anxiety, medications (opioids, sedating anti-emetics) in addition to relative volume depletion. Will do our best to mitigate symptoms with IV fluids and non-med strategies (ie belly binder). Unresponsiveness, rigidity around these episodes sounds like transient phenomenon related to syncope rather than true seizure -- prior MRI, cvEEG reassuring (07/2023)  -- 1L LR now  -- check formal orthostatic vital signs  -- encourage activity OOB; discussed with nursing  -- anticipate stopping telemetry in next 24 hours     # Lower GI bleeding  Ongoing scant bright red blood in stools, likely oozing from anastomosis, but Hb stable and retics improving  -- follow CBC daily for now; decrease frequency if remains stable  -- no further endoscopy or imaging planned this admission  -- pursue [facility]-push enteroscopy at [facility] per outpatient team    # Iron  deficiency anemia  Due to chronic GI losses and inadequate dietary intake  -- has received iron  dextran 1 gram safely, with premedications per allergy  service  -- repeat CBC, retics +/- iron  studies in 2-4 weeks to assess response    # Desmoid tumors  -- HOLD nirogacestat  per oncology, given recent GI bleeding     # Mood / anxiety  -- continue escitalopram    -- lorazepam  0.5 mg bid prn (home medication)    Daily checklist:  VTE prophylaxis: low risk, PADUA score < 4; encourage ambulation  Diet: Nutrition Therapy Regular/House  Code status: Full Code  HCDM:   HCDM (patient stated preference): Bara,[name] - Mother - [phone]    HCDM, back-up (If primary HCDM is unavailable): Daryle, Boyington - Father - [phone]    HCDM, back-up (If  primary HCDM is unavailable): Meah, Jiron - Sister - [phone]    Disposition: floor status. Not medically ready for discharge. No HH / DME needs anticipated at discharge.    PT/OT recommendations: (not recorded) / (not recorded)  DME needs: (not recorded) / (not recorded)    I personally spent 50 minutes face-to-face and non-face-to-face in the care of this patient, which includes all pre, intra, and post visit time on the date of service.  All documented time was specific to the E/M visit and does not include any procedures that may have been performed.    Please page [facility] [phone] with questions. This note reflects active problems and daily updates. Please refer to hospital course for additional details and resolved problems.    ___________________________________________________________________    Subjective:  Rapid response x 2 for syncopal events yesterday  Still seeing some blood in stools  Pain in lower abdomen and pelvis seems worse than it had been a few weeks ago  Still lightheaded whenever getting out of bed, worried about dehydration  Reviewed hospital course to date, discharge goals    Discussed with nursing, case manager, oncology fellow    Labs/Studies:  Labs and Studies from the last 24hrs per EMR and Reviewed    Objective:  Temp:  [36.6 ??C (97.9 ??F)-36.8 ??C (98.2 ??F)] 36.7 ??C (98.1 ??F)  Pulse:  [63-84] 63  Resp:  [16-17] 17  BP: (100-108)/(52-65) 108/65  SpO2:  [97 %-100 %] 100 %    GEN: pale young woman lying in bed, not in distress  CV: RRR, sinus arrhythmia on telemetry  PULM: unlabored in room air  ABD: soft, nondistended  EXT: ankles warm, no ankle edema  NEURO: alert and oriented to person place time, no focal deficits    ------------  Prentice SHAUNNA Search, MD PhD  [facility]

## 2023-10-20 LAB — CBC
HEMATOCRIT: 27 % — ABNORMAL LOW (ref 34.0–44.0)
HEMOGLOBIN: 9 g/dL — ABNORMAL LOW (ref 11.3–14.9)
MEAN CORPUSCULAR HEMOGLOBIN CONC: 33.3 g/dL (ref 32.0–36.0)
MEAN CORPUSCULAR HEMOGLOBIN: 25.5 pg — ABNORMAL LOW (ref 25.9–32.4)
MEAN CORPUSCULAR VOLUME: 76.4 fL — ABNORMAL LOW (ref 77.6–95.7)
MEAN PLATELET VOLUME: 9.7 fL (ref 6.8–10.7)
PLATELET COUNT: 265 10*9/L (ref 150–450)
RED BLOOD CELL COUNT: 3.54 10*12/L — ABNORMAL LOW (ref 3.95–5.13)
RED CELL DISTRIBUTION WIDTH: 13.6 % (ref 12.2–15.2)
WBC ADJUSTED: 8 10*9/L (ref 3.6–11.2)

## 2023-10-20 LAB — CORTISOL
CORTISOL TOTAL: 0.9 ug/dL — CL (ref >=1.5)
CORTISOL TOTAL: 4.1 ug/dL (ref >=1.5)
CORTISOL TOTAL: 12.2 ug/dL (ref >=1.5)
CORTISOL TOTAL: 13.1 ug/dL (ref >=1.5)

## 2023-10-20 LAB — BASIC METABOLIC PANEL
ANION GAP: 8 mmol/L (ref 5–14)
BLOOD UREA NITROGEN: 8 mg/dL — ABNORMAL LOW (ref 9–23)
BUN / CREAT RATIO: 15
CALCIUM: 8.9 mg/dL (ref 8.7–10.4)
CHLORIDE: 104 mmol/L (ref 98–107)
CO2: 30 mmol/L (ref 20.0–31.0)
CREATININE: 0.52 mg/dL — ABNORMAL LOW (ref 0.55–1.02)
EGFR CKD-EPI (2021) FEMALE: >90 mL/min/1.73m2 (ref >=60)
GLUCOSE RANDOM: 130 mg/dL (ref 70–179)
POTASSIUM: 3.5 mmol/L (ref 3.4–4.8)
SODIUM: 142 mmol/L (ref 135–145)

## 2023-10-20 LAB — MAGNESIUM: MAGNESIUM: 2.1 mg/dL (ref 1.6–2.6)

## 2023-10-20 MED ADMIN — oxyCODONE (ROXICODONE) 5 mg/5 mL solution 20 mg: 20 mg | ORAL | @ 02:00:00 | Stop: 2023-10-27

## 2023-10-20 MED ADMIN — escitalopram oxalate (LEXAPRO) tablet 5 mg: 5 mg | ORAL | @ 12:00:00

## 2023-10-20 MED ADMIN — ondansetron (ZOFRAN) injection 4 mg: 4 mg | INTRAVENOUS | @ 04:00:00

## 2023-10-20 MED ADMIN — oxyCODONE (ROXICODONE) 5 mg/5 mL solution 20 mg: 20 mg | ORAL | @ 23:00:00 | Stop: 2023-10-27

## 2023-10-20 MED ADMIN — cosyntropin (CORTROSYN) injection 0.25 mg: .25 mg | INTRAVENOUS | @ 18:00:00 | Stop: 2023-10-20

## 2023-10-20 MED ADMIN — oxyCODONE (ROXICODONE) 5 mg/5 mL solution 20 mg: 20 mg | ORAL | @ 08:00:00 | Stop: 2023-10-27

## 2023-10-20 MED ADMIN — lactated ringers bolus 1,000 mL: 1000 mL | INTRAVENOUS | @ 17:00:00 | Stop: 2023-10-20

## 2023-10-20 MED ADMIN — baclofen (LIORESAL) tablet 5 mg: 5 mg | ORAL | @ 12:00:00

## 2023-10-20 MED ADMIN — oxyCODONE (ROXICODONE) 5 mg/5 mL solution 20 mg: 20 mg | ORAL | @ 16:00:00 | Stop: 2023-10-27

## 2023-10-20 MED ADMIN — loperamide (IMODIUM) capsule 2 mg: 2 mg | ORAL | @ 21:00:00

## 2023-10-20 MED ADMIN — oxyCODONE (ROXICODONE) 5 mg/5 mL solution 20 mg: 20 mg | ORAL | @ 12:00:00 | Stop: 2023-10-27

## 2023-10-20 MED ADMIN — HYDROmorphone (PF) (DILAUDID) injection 1 mg: 1 mg | INTRAVENOUS | @ 11:00:00 | Stop: 2023-10-21

## 2023-10-20 MED ADMIN — cetirizine (ZYRTEC) tablet 20 mg: 20 mg | ORAL | @ 12:00:00

## 2023-10-20 MED ADMIN — lidocaine (ASPERCREME) 4 % 1 patch: 1 | TRANSDERMAL | @ 12:00:00

## 2023-10-20 MED ADMIN — cetirizine (ZYRTEC) tablet 20 mg: 20 mg | ORAL

## 2023-10-20 MED ADMIN — HYDROmorphone (PF) (DILAUDID) injection 1 mg: 1 mg | INTRAVENOUS | @ 04:00:00 | Stop: 2023-10-20

## 2023-10-20 MED ADMIN — pantoprazole (Protonix) injection 40 mg: 40 mg | INTRAVENOUS | @ 12:00:00

## 2023-10-20 MED ADMIN — pantoprazole (Protonix) injection 40 mg: 40 mg | INTRAVENOUS

## 2023-10-20 MED ADMIN — baclofen (LIORESAL) tablet 5 mg: 5 mg | ORAL

## 2023-10-20 MED ADMIN — HYDROmorphone (PF) (DILAUDID) injection 1 mg: 1 mg | INTRAVENOUS | @ 15:00:00 | Stop: 2023-10-21

## 2023-10-20 MED ADMIN — butalbital-acetaminophen-caffeine (ESGIC) per tablet 1 tablet: 1 | ORAL

## 2023-10-20 MED ADMIN — famotidine (PEPCID) tablet 20 mg: 20 mg | ORAL

## 2023-10-20 MED ADMIN — acetaminophen (TYLENOL) tablet 1,000 mg: 1000 mg | ORAL | @ 17:00:00

## 2023-10-20 MED ADMIN — ondansetron (ZOFRAN) injection 4 mg: 4 mg | INTRAVENOUS | @ 12:00:00

## 2023-10-20 MED ADMIN — acetaminophen (TYLENOL) tablet 1,000 mg: 1000 mg | ORAL | @ 02:00:00

## 2023-10-20 MED ADMIN — HYDROmorphone (PF) (DILAUDID) injection 1 mg: 1 mg | INTRAVENOUS | @ 21:00:00 | Stop: 2023-10-21

## 2023-10-20 MED ADMIN — baclofen (LIORESAL) tablet 5 mg: 5 mg | ORAL | @ 18:00:00

## 2023-10-20 MED ADMIN — heparin, porcine (PF) 100 unit/mL injection 500 Units: 500 [IU] | INTRAVENOUS | @ 21:00:00

## 2023-10-20 NOTE — Unmapped (Signed)
 Pain Psychology Contact Note:        Patient: [name] [name] ([id number])   Supervising Clinical Psychologist: Greig Holland, PhD   Psychology Fellow: Asberry [place], PhD   Date: October 20, 2023   Time: 40 minutes   Time of Day: 11:15AM  Method: Inpatient face-to-face      [name] [name] a 23 year old White woman with PMH significant [name] syndrome s/p colectomy, desmoid tumor, anemia, and severe protein-calorie malnutrition. She is followed in the outpatient setting by pain anesthesiologist, Arvella Daring, MD, for chronic abdominal and right shoulder pain. [name] [name] was recently admitted for GI bleed (10/13/2023). Our team is following her now to provide supportive psychotherapy as she remains inpatient.      Objective and Behavioral Observations:   [name] [name] was with her mother when the psychology fellow entered the room. Both agreed to the visit today. [name] [name] reported that mood has been stable this admission and pain is well controlled on her current regimen. [name] [name] reported that she walked earlier and is motivated to walk more later today with the primary teams' recommendations. She shared medical updates leading up to her recent admission and endorsed some normative worry around school soon. She reported that she and her mother are training a support dog for her, and they believe this will be helpful for identifying and responding to syncopal episodes. [name] [name] and her mother expressed interest in her joining a yoga group for other patients with chronic illness.     Supportive counseling and CBT were employed throughout the visit. Adaptive coping strategies were reinforced. Activity pacing was discussed. [name] [name] will continue to seek support from her boyfriend, close friend, and family. She plans to walk later today and has been watching TV or engaging in other school-related tasks to help focus on other things outside of her pain experience. She describes these strategies as effective, and she was encouraged to continue these while inpatient. We discussed follow-up with pain psychology, and [name] [name] is agreeable to continued follow-up with our team while she is inpatient. She expressed gratitude for the visit today.     Mental Status Exam:   Appearance: No concerns noted   Motor: No concerns noted   Speech/language: No concerns noted     Assessment:   [name] [name] was engaged throughout the entirety of the session. Her mood was euthymic with congruent affect. She endorsed mild anxiety and ongoing pain that appears to be well-managed with current pain regimen and coping strategies. She denied SI/HI. She feels supported by family and her medical team, and she expressed interest in continued follow-up with our team.      Plan: Asberry will follow up with [name] [name] for continued supportive psychotherapy while inpatient.      Asberry Kingston, PhD   Psychology Postdoctoral Fellow     Greig Holland, PhD

## 2023-10-20 NOTE — Unmapped (Signed)
 Shift Summary    Pt A/O x 4; Afebrile, VSS. NAD overnight. Reported episode of blood stools. Pain management achieved with prns as per MAR. Ongoing POC discussed with patient and mother at the bedside    Multiple PRN pain medications were given throughout the shift in response to persistent and severe pain, with some improvement in pain scores after interventions.   Pain location shifted from head to abdomen and was described as both acute and chronic, with pain frequency increasing to constant/continuous at times.   Electrolyte and magnesium  levels were within normal limits on laboratory assessment, and no acute electrolyte disturbances were observed.   Family was present and visiting frequently throughout the shift, and blood specimens were collected as ordered.   Overall, pain management required frequent intervention, and laboratory values were stable during the shift.    Optimal Comfort and Wellbeing: Pain was initially moderate and intermittent, then became severe and constant/continuous in the abdomen, with multiple PRN pain medications administered and some reduction in pain scores noted after interventions, though pain remained elevated at times throughout the shift.    Electrolyte Balance: Electrolyte values remained within normal limits on the most recent basic metabolic panel, with no significant abnormalities noted during the shift.

## 2023-10-20 NOTE — Unmapped (Signed)
 Shift Summary  Cortisol levels increased after cosyntropin  administration, and lactated ringers  was given and then stopped during the shift.   Multiple PRN pain medications were administered in response to fluctuating pain scores.   Safety and fall prevention interventions were consistently implemented, with no reported injuries or falls.   Ambulation outside the room was achieved with stand by assist and someone ff with a chair, she used her cane  Overall, the shift was marked by ongoing pain management, electrolyte monitoring, and maintenance of safety protocols.     Absence of Hospital-Acquired Illness or Injury: No new hospital-acquired injuries or illnesses were documented, and safety interventions such as low bed, family presence, and ID bands were consistently maintained throughout the shift.     Absence of Fall and Fall-Related Injury: Fall prevention strategies including frequent toileting, hourly visual checks, and use of nonskid footwear were maintained, and no falls or related injuries were reported.     Electrolyte Balance: Lactated ringers  was administered and then stopped during the shift, and cortisol levels increased from 4.1 ug/dL to 87.7 ug/dL after cosyntropin  administration.

## 2023-10-20 NOTE — Unmapped (Signed)
 Hospital Medicine Daily Progress Note    Assessment/Plan:    Principal Problem:    GI bleed  Active Problems:    [name] (familial adenomatous polyposis)    Intestinal polyps    History of colonic polyps    Syncope    History of colectomy    Lower abdominal pain        Please see wound care flowsheets for wound documentation     LOS: 7 days (under inpatient status)    [name] [name] is a 23 y.o. 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 In brief, her pain and nausea are multifactorial and goal is not complete resolution or aggressive diagnostics but symptomatic treatment and maintenance of function. Once orthostasis has been adequately addressed, IV analgesia will be only remaining barrier to discharge. Historically she uses IV opioids up until time of discharge  -- NO CHANGES to pain regimen without multiparty discussion including patient, unless true emergency  -- for pain  -- oxycodone  (liquid) 20 mg q4 prn  -- hydromorphone  1 mg IV q4 prn   -- buprenorphine  20 mcg/h patch   -- baclofen  5 mg tid  -- for nausea  -- ondansetron  IV prn  -- promethazine  po prn  -- scopolamine  patch  -- NO diphenhydramine  IV in absence of clear indication  -- for dyspepsia  -- famotidine  qhs  -- PPI IV q12  -- for motility  -- naloxegol  prn  -- try loperamide  daily + prn; okay to titrate    # Orthostatic intolerance  # Recurrent vasovagal syncope  Remains symptomatic with OOB activity. See my prior note for diagnostic thoughts. Treating supportively with IV and oral hydration. Random cortisol levels low x 2 (though probably not at physiologic time). Doubt true adrenal insufficiency as only risk factor would be recent enteral budesonide  use for ~6 weeks over the summer but this is pretty uncommon association (see PMID [id number])  -- 1L LR again, anticipate doing this daily while inpatient  -- check formal orthostatic vital signs  -- encourage activity OOB; discussed with nursing  -- okay to stop telemetry  -- ACTH stimulation test ordered, discussed with nurse and family    # Lower GI bleeding  Ongoing scant bright red blood in stools, likely oozing from anastomosis, but Hb stable  -- follow CBC daily for now; decrease frequency if remains stable  -- no further endoscopy or imaging planned this admission  -- pursue [facility] per outpatient team    # Iron  deficiency anemia  Due to chronic GI losses and inadequate dietary intake  -- has received iron  dextran 1 gram safely, with premedications per allergy  service  -- repeat CBC, retics +/- iron  studies in 2-4 weeks to assess response    # Desmoid tumors  -- HOLD nirogacestat  per oncology (confirmed 10/19/2023), given recent GI bleeding     # Mood / anxiety  -- continue escitalopram    -- lorazepam  0.5 mg bid prn (home medication)    Daily checklist:  VTE prophylaxis: low risk, PADUA score < 4; encourage ambulation  Diet: Nutrition Therapy Regular/House  Code status: Full Code  HCDM:   HCDM (patient stated preference): Crosby,[name] - Mother - [phone]    HCDM, back-up (If primary HCDM is  unavailable): Juleen, Sorrels - Father - [phone]    HCDM, back-up (If primary HCDM is unavailable): Jyllian, Haynie - Sister - [phone]    Disposition: floor status. Not medically ready for discharge. No HH / DME needs anticipated at discharge.    PT/OT recommendations: (not recorded) / (not recorded)  DME needs: (not recorded) / (not recorded)    I personally spent 50 minutes face-to-face and non-face-to-face in the care of this patient, which includes all pre, intra, and post visit time on the date of service.  All documented time was specific to the E/M visit and does not include any procedures that may have been performed.    Please page [facility] [phone] with questions. This note reflects active problems and daily updates. Please refer to hospital course for additional details and resolved problems.    ___________________________________________________________________    Subjective:  Walked unit yesterday afternoon, again this morning  Needed frequent rest breaks but did not actually have syncope when walking  Took loperamide  yesterday (1 dose), no change in stool pattern  Ongoing pelvic pain not significantly changed    Discussed with nursing, case manager    Labs/Studies:  Labs and Studies from the last 24hrs per EMR and Reviewed  Telemetry reviewed: sinus rhythm with appropriate chronotropic variation, rare ectopy, no AV block    Objective:  Temp:  [36.7 ??C (98.1 ??F)-36.8 ??C (98.2 ??F)] 36.7 ??C (98.1 ??F)  Pulse:  [76-99] 84  Resp:  [16-17] 17  BP: (101-109)/(56-66) 109/59  SpO2:  [95 %-100 %] 99 %    GEN: pale young woman lying in bed, not in distress  CV: RRR  PULM: unlabored in room air  ABD: soft, nondistended  EXT: ankles warm, no ankle edema  NEURO: alert and oriented to person place time, no focal deficits    R chest wall port is accessed, no erythema    ------------  Prentice SHAUNNA Search, MD PhD  [facility]

## 2023-10-21 DIAGNOSIS — E2749 Other adrenocortical insufficiency: Principal | ICD-10-CM

## 2023-10-21 LAB — BASIC METABOLIC PANEL
ANION GAP: 9 mmol/L (ref 5–14)
BLOOD UREA NITROGEN: 8 mg/dL — ABNORMAL LOW (ref 9–23)
BUN / CREAT RATIO: 15
CALCIUM: 9 mg/dL (ref 8.7–10.4)
CHLORIDE: 105 mmol/L (ref 98–107)
CO2: 31 mmol/L (ref 20.0–31.0)
CREATININE: 0.52 mg/dL — ABNORMAL LOW (ref 0.55–1.02)
EGFR CKD-EPI (2021) FEMALE: >90 mL/min/1.73m2 (ref >=60)
GLUCOSE RANDOM: 99 mg/dL (ref 70–179)
POTASSIUM: 3.8 mmol/L (ref 3.4–4.8)
SODIUM: 145 mmol/L (ref 135–145)

## 2023-10-21 LAB — CBC
HEMATOCRIT: 28.5 % — ABNORMAL LOW (ref 34.0–44.0)
HEMOGLOBIN: 9.3 g/dL — ABNORMAL LOW (ref 11.3–14.9)
MEAN CORPUSCULAR HEMOGLOBIN CONC: 32.8 g/dL (ref 32.0–36.0)
MEAN CORPUSCULAR HEMOGLOBIN: 25.6 pg — ABNORMAL LOW (ref 25.9–32.4)
MEAN CORPUSCULAR VOLUME: 78.1 fL (ref 77.6–95.7)
MEAN PLATELET VOLUME: 9.6 fL (ref 6.8–10.7)
PLATELET COUNT: 278 10*9/L (ref 150–450)
RED BLOOD CELL COUNT: 3.64 10*12/L — ABNORMAL LOW (ref 3.95–5.13)
RED CELL DISTRIBUTION WIDTH: 13.8 % (ref 12.2–15.2)
WBC ADJUSTED: 7.4 10*9/L (ref 3.6–11.2)

## 2023-10-21 LAB — MAGNESIUM: MAGNESIUM: 2.2 mg/dL (ref 1.6–2.6)

## 2023-10-21 MED ORDER — PROMETHAZINE 12.5 MG TABLET
ORAL_TABLET | Freq: Four times a day (QID) | ORAL | 0 refills | 3.00000 days | PRN
Start: 2023-10-21 — End: ?

## 2023-10-21 MED ORDER — LOPERAMIDE 2 MG CAPSULE
ORAL_CAPSULE | Freq: Four times a day (QID) | ORAL | 0 refills | 8.00000 days | PRN
Start: 2023-10-21 — End: ?

## 2023-10-21 MED ORDER — BACLOFEN 10 MG TABLET
ORAL_TABLET | Freq: Three times a day (TID) | ORAL | 0 refills | 14.00000 days
Start: 2023-10-21 — End: ?

## 2023-10-21 MED ADMIN — HYDROmorphone (PF) (DILAUDID) injection 1 mg: 1 mg | INTRAVENOUS | @ 10:00:00 | Stop: 2023-10-21

## 2023-10-21 MED ADMIN — cetirizine (ZYRTEC) tablet 20 mg: 20 mg | ORAL | @ 01:00:00

## 2023-10-21 MED ADMIN — famotidine (PEPCID) tablet 20 mg: 20 mg | ORAL | @ 01:00:00

## 2023-10-21 MED ADMIN — loperamide (IMODIUM) capsule 2 mg: 2 mg | ORAL | @ 22:00:00

## 2023-10-21 MED ADMIN — ondansetron (ZOFRAN) injection 4 mg: 4 mg | INTRAVENOUS | @ 10:00:00

## 2023-10-21 MED ADMIN — oxyCODONE (ROXICODONE) 5 mg/5 mL solution 20 mg: 20 mg | ORAL | @ 12:00:00 | Stop: 2023-10-27

## 2023-10-21 MED ADMIN — HYDROmorphone (PF) (DILAUDID) injection 1 mg: 1 mg | INTRAVENOUS | @ 01:00:00 | Stop: 2023-10-21

## 2023-10-21 MED ADMIN — acetaminophen (TYLENOL) tablet 1,000 mg: 1000 mg | ORAL

## 2023-10-21 MED ADMIN — simethicone (MYLICON) chewable tablet 80 mg: 80 mg | ORAL | @ 03:00:00

## 2023-10-21 MED ADMIN — oxyCODONE (ROXICODONE) 5 mg/5 mL solution 20 mg: 20 mg | ORAL | @ 20:00:00 | Stop: 2023-10-27

## 2023-10-21 MED ADMIN — HYDROmorphone (PF) (DILAUDID) injection 1 mg: 1 mg | INTRAVENOUS | @ 05:00:00 | Stop: 2023-10-21

## 2023-10-21 MED ADMIN — baclofen (LIORESAL) tablet 5 mg: 5 mg | ORAL | @ 01:00:00

## 2023-10-21 MED ADMIN — baclofen (LIORESAL) tablet 5 mg: 5 mg | ORAL | @ 18:00:00

## 2023-10-21 MED ADMIN — lactated ringers bolus 1,000 mL: 1000 mL | INTRAVENOUS | @ 15:00:00 | Stop: 2023-10-21

## 2023-10-21 MED ADMIN — HYDROmorphone (PF) (DILAUDID) injection 1 mg: 1 mg | INTRAVENOUS | @ 14:00:00 | Stop: 2023-10-21

## 2023-10-21 MED ADMIN — oxyCODONE (ROXICODONE) 5 mg/5 mL solution 20 mg: 20 mg | ORAL | @ 16:00:00 | Stop: 2023-10-27

## 2023-10-21 MED ADMIN — cetirizine (ZYRTEC) tablet 20 mg: 20 mg | ORAL | @ 13:00:00

## 2023-10-21 MED ADMIN — buprenorphine 20 mcg/hour transdermal patch 1 patch: 1 | TRANSDERMAL | @ 19:00:00

## 2023-10-21 MED ADMIN — scopolamine (TRANSDERM-SCOP) 1 mg over 3 days topical patch 1 mg: 1 | TOPICAL | @ 19:00:00

## 2023-10-21 MED ADMIN — pantoprazole (Protonix) injection 40 mg: 40 mg | INTRAVENOUS | @ 01:00:00

## 2023-10-21 MED ADMIN — fluticasone propionate (FLONASE) 50 mcg/actuation nasal spray 2 spray: 2 | TOPICAL | @ 19:00:00

## 2023-10-21 MED ADMIN — lidocaine (ASPERCREME) 4 % 1 patch: 1 | TRANSDERMAL | @ 13:00:00

## 2023-10-21 MED ADMIN — HYDROmorphone (DILAUDID) injection Syrg 0.5 mg: .5 mg | INTRAVENOUS | @ 22:00:00 | Stop: 2023-10-22

## 2023-10-21 MED ADMIN — pantoprazole (Protonix) injection 40 mg: 40 mg | INTRAVENOUS | @ 13:00:00

## 2023-10-21 MED ADMIN — escitalopram oxalate (LEXAPRO) tablet 5 mg: 5 mg | ORAL | @ 13:00:00

## 2023-10-21 MED ADMIN — HYDROmorphone (DILAUDID) injection Syrg 0.5 mg: .5 mg | INTRAVENOUS | @ 18:00:00 | Stop: 2023-10-22

## 2023-10-21 MED ADMIN — oxyCODONE (ROXICODONE) 5 mg/5 mL solution 20 mg: 20 mg | ORAL | Stop: 2023-10-27

## 2023-10-21 MED ADMIN — oxyCODONE (ROXICODONE) 5 mg/5 mL solution 20 mg: 20 mg | ORAL | @ 03:00:00 | Stop: 2023-10-27

## 2023-10-21 MED ADMIN — ondansetron (ZOFRAN) injection 4 mg: 4 mg | INTRAVENOUS | @ 01:00:00

## 2023-10-21 MED ADMIN — baclofen (LIORESAL) tablet 5 mg: 5 mg | ORAL | @ 13:00:00

## 2023-10-21 MED ADMIN — acetaminophen (TYLENOL) tablet 1,000 mg: 1000 mg | ORAL | @ 16:00:00

## 2023-10-21 MED FILL — OGSIVEO 100 MG TABLET: ORAL | 14 days supply | Qty: 28 | Fill #2

## 2023-10-21 NOTE — Unmapped (Signed)
 Alert and oriented x4, pain med requested at least every 2 hrs, pain level is a 7 and meds helps a bit per pt, up to bathroom, ambulated with PT tech this am, took all meds, mom at bedside, cont plan of care.    Shift Summary      Absence of Fall and Fall-Related Injury: Fall prevention interventions were consistently maintained throughout the shift, including regular toileting and hourly visual checks, with no documentation of falls or injuries. Safety devices and equipment were not required for transfers, and the low bed and fall reduction program remained in place.     Electrolyte Balance: Lactated ringers  infusion was administered and then stopped during the shift; no abnormal electrolyte lab values are documented in the provided data.

## 2023-10-21 NOTE — Unmapped (Signed)
 Endocrine Team New Consult Note     Consult information:  Requesting Attending Physician : Prentice SHAUNNA Search, MD  Service Requesting Consult : [facility])  Primary Care Provider: Job Lukes, [facility]  Impression:  [name] [name] is a 23 y.o. female admitted for GI bleed and severe iron  deficiency. We have been consulted at the request of Prentice SHAUNNA Search, MD to evaluate [name] [name] for adrenal insufficiency.    Medical Decision Making:  Diagnoses:  Inadequate cortisol response to ACTH stimulation test  [name].  Iron  deficiency.    Studies reviewed 10/21/23:  Labs: BMP, cortisol trend (AM cortisol, day-time baseline, 30 min and 1 hour post-cosyntropin  stimulation)  Other studies: TSH, T4  Interpretation: Low morning cortisol with inappropriately low response to cosyntropin  stimulation.  Notes reviewed: Primary team      Overall impression based on above reviews and history:  Low morning cortisol to 0.9 while hospitalized with abnormal ACTH suppression test with cortisol 13.1 one-hour after cosyntropin  administration. Notably normal stim test August 2024. Etiology is not clear, but likely a combination of chronic opioid use combined with mild suppression from recent budesonide  course. Notably budesonide  is gut-specific and not systemically absorbed, but query abnormal absorption given her history of proctocolectomy and chronic GI inflammation.    Her orthostatic intolerance preceded budesonide  use, thus I do not suspect these symptoms are related to adrenal insufficiency. Reassuringly she does not otherwise demonstrate symptomatic or laboratory evidence of adrenal insufficiency such as hypokalemia, hyponatremia, salt craving, or hyperpigmentation. Nonetheless, she has demonstrated an inappropriately low morning cortisol while hospitalized. Cortisol did have a 9 point response to cosyntropin  stimulation, but still not adequate. Thus suspect low grade adrenal insufficiency that would benefit from glucocorticoid repletion.     Recommendations:  - start hydrocortisone  10mg  daily  - follow up in endocrinology clinic for repeat stim test   - we will place request for follow up  - repeat thyroid testing with endocrinology in the outpatient setting    Thank you for this consult. Discussed plan with primary team. We will continue to follow and make recommendations and place orders as appropriate.    Please page with questions or concerns: Endocrine fellow on call: [phone].    Subjective:  Initial HPI:  [name] [name] is a 23 y.o. female admitted for GI bleed and iron  deficiency. We have been consulted at the request of Prentice SHAUNNA Search, MD to evaluate [name] [name] for adrenal insufficiency.     Morning cortisol drawn given her recent orthostatic intolerance and vasovagal syncope. Cortisol drawn at 0400 and 0.9 on hospital day 7. Thus a cosyntropin  stimulation test was administered (timing appropriate) and notable for 1-hour cortisol of 13.1. Primary team consulted endocrinology for adrenal insufficiency.     Endocrine History:  None, although has been seen by Dr. Morris in endocrinology clinic for abnormal thyroid labs intermittently (felt to be euthyroid sick syndrome). Notably she is at risk for thyroid cancer and pituitary tumor given her underlying [name].      Current Nutrition:  Active Orders   Diet    Nutrition Therapy Regular/House         ROS: As per HPI.    Scheduled Medications[1]    Current Outpatient Medications   Medication Instructions    acetaminophen  (TYLENOL  EXTRA STRENGTH) 1,000 mg, Oral, Every 8 hours    baclofen  (LIORESAL ) 5 mg, Oral, 3 times a day (standard)    butalbital -acetaminophen -caffeine  (ESGIC ) 50-325-40 mg per tablet 1 tablet, Oral, 2  times a day PRN    cetirizine  (ZYRTEC ) 10 mg, 2 times a day (standard)    escitalopram  oxalate (LEXAPRO ) 5 mg, Oral, Daily (standard)    famotidine  (PEPCID ) 20 mg, Oral, Nightly    fluticasone  propionate (FLONASE ) 50 mcg/actuation nasal spray Use 1 spray under the butrans  patch once every 7 days to prevent allergic reaction to the adhesive    lidocaine  (ASPERCREME) 4 % patch 1 patch, Transdermal, Daily (standard), Apply for 12 hours then remove for 12 hours.    lidocaine  (XYLOCAINE ) 2 % Soln Mix with Children's Benadryl  and Maalox. Swish and spit every 4 hours as needed for mouth pain    lidocaine  (XYLOCAINE ) 5 % ointment Apply to affected area daily    LORazepam  (ATIVAN ) 0.5 mg, Oral, 2 times a day PRN    naloxegol  (MOVANTIK ) 12.5 mg, Oral, Daily PRN    naloxone  (NARCAN ) 4 mg nasal spray One spray in either nostril once for known/suspected opioid overdose. May repeat every 2-3 minutes in alternating nostril til EMS arrives    OGSIVEO  100 mg, Oral, 2 times a day (standard), Swallow tablets whole; do not break, crush, or chew.    ondansetron  (ZOFRAN -ODT) 8 mg, orally disintegrating tablet, Every 8 hours PRN    pantoprazole  (PROTONIX ) 40 mg, Oral, 2 times a day (AC)    scopolamine  (TRANSDERM-SCOP) 1 mg, Transdermal, Every 72 hours    sucralfate  (CARAFATE ) 100 mg/mL suspension 10 mL, 4 times a day    topiramate  (TOPAMAX ) 25 mg, Oral, 2 times a day (standard)           Past Medical History[2]    Past Surgical History[3]    Family History[4]    Short Social History[5]    OBJECTIVE:  BP 111/59  - Pulse 71  - Temp 36.8 ??C (98.2 ??F) (Oral)  - Resp 18  - Ht 165.1 cm (5' 5)  - Wt 54.7 kg (120 lb 9.6 oz)  - SpO2 99%  - BMI 20.07 kg/m??   Wt Readings from Last 12 Encounters:   10/15/23 54.7 kg (120 lb 9.6 oz)   10/06/23 55 kg (121 lb 4.1 oz)   09/29/23 54.9 kg (121 lb)   09/23/23 53.5 kg (118 lb)   09/01/23 55.3 kg (122 lb)   08/20/23 54.8 kg (120 lb 12.8 oz)   08/05/23 53.5 kg (118 lb)   07/31/23 53.7 kg (118 lb 4.8 oz)   07/20/23 54 kg (119 lb)   07/15/23 54 kg (119 lb)   07/09/23 54 kg (119 lb)   07/03/23 54.1 kg (119 lb 3.2 oz)     Physical Exam  Constitutional:       Appearance: Normal appearance.   Pulmonary:      Effort: Pulmonary effort is normal. No respiratory distress.   Abdominal:      General: There is no distension.   Musculoskeletal:         General: Normal range of motion.   Neurological:      Mental Status: She is alert and oriented to person, place, and time. Mental status is at baseline.   Psychiatric:         Mood and Affect: Mood normal.         Summary of labs:  Lab Results   Component Value Date    A1C 4.7 (L) 06/06/2022     Lab Results   Component Value Date    GFR UNABLE TO CALCULATE GFR.BASED ON PATIENT AGE 51/09/2011    CREATININE 0.52 (  L) 10/21/2023     Lab Results   Component Value Date    WBC 7.4 10/21/2023    HGB 9.3 (L) 10/21/2023    HCT 28.5 (L) 10/21/2023    PLT 278 10/21/2023       Lab Results   Component Value Date    NA 145 10/21/2023    K 3.8 10/21/2023    CL 105 10/21/2023    CO2 31.0 10/21/2023    BUN 8 (L) 10/21/2023    CREATININE 0.52 (L) 10/21/2023    GLU 99 10/21/2023    CALCIUM 9.0 10/21/2023    MG 2.2 10/21/2023    PHOS 3.9 07/19/2023       Lab Results   Component Value Date    BILITOT 0.2 (L) 10/18/2023    BILIDIR <0.10 03/17/2023    PROT 5.5 (L) 10/18/2023    ALBUMIN 3.1 (L) 10/18/2023    ALT 13 10/18/2023    AST <8 10/18/2023    ALKPHOS 49 10/18/2023    GGT 13 10/31/2011       Lab Results   Component Value Date    INR 0.99 04/04/2023    APTT 33.7 03/25/2023                [1]    baclofen   5 mg Oral TID    buprenorphine   1 patch Transdermal Q7 Days    cetirizine   20 mg Oral BID    escitalopram  oxalate  5 mg Oral Daily    famotidine   20 mg Oral Nightly    lidocaine   1 patch Transdermal Daily    loperamide   2 mg Oral Daily    pantoprazole  (Protonix ) intravenous solution  40 mg Intravenous BID    scopolamine   1 patch Topical Q72H   [2]   Past Medical History:  Diagnosis Date    Abdominal pain     Acid reflux     occas    Anesthesia complication     itching, shaking, coldness; last few surgeries have gone much better    Cancer         Cataract of right eye     COVID-19 virus infection 01/2019    Cyst of thyroid determined by ultrasound     monitoring    [name]     2 right forearm, 1 left thigh, 1 right scapula, 1 under left clavicle; multiple    Difficult intravenous access     [name] (familial adenomatous polyposis)     [name] (HHS-HCC)     Gastric polyps     History of chemotherapy     last treatment approx 05/2019    History of colon polyps     History of COVID-19 01/2019    Ileus     03/16/2022    Iron  deficiency anemia due to chronic blood loss     received iron  infusion 11-2019    PONV (postoperative nausea and vomiting)     Rectal bleeding     Syncopal episodes     especially if becoming dehydrated   [3]   Past Surgical History:  Procedure Laterality Date    COLON SURGERY      cyroablation      cystis removal      desmoid removal      IR INSERT PORT AGE GREATER THAN 5 YRS  07/20/2023    IR INSERT PORT AGE GREATER THAN 5 YRS 07/20/2023 Gu[place]ford Hicks, MD IMG VIR H&V [facility]    PR  CLOSE ENTEROSTOMY,RESEC+ANAST N/A 10/09/2020    Procedure: ILEOSTOMY TAKEDOWN;  Surgeon: Evalene Karalee Shilling, MD;  Location: OR [place];  Service: General Surgery    PR COLONOSCOPY W/BIOPSY SINGLE/MULTIPLE N/A 10/27/2012    Procedure: COLONOSCOPY, FLEXIBLE, PROXIMAL TO SPLENIC FLEXURE; WITH BIOPSY, SINGLE OR MULTIPLE;  Surgeon: Annalee LABOR Mir, MD;  Location: PEDS PROCEDURE ROOM [facility];  Service: Gastroenterology    PR COLONOSCOPY W/BIOPSY SINGLE/MULTIPLE N/A 09/14/2013    Procedure: COLONOSCOPY, FLEXIBLE, PROXIMAL TO SPLENIC FLEXURE; WITH BIOPSY, SINGLE OR MULTIPLE;  Surgeon: Annalee LABOR Mir, MD;  Location: PEDS PROCEDURE ROOM [facility];  Service: Gastroenterology    PR COLONOSCOPY W/BIOPSY SINGLE/MULTIPLE N/A 11/08/2014    Procedure: COLONOSCOPY, FLEXIBLE, PROXIMAL TO SPLENIC FLEXURE; WITH BIOPSY, SINGLE OR MULTIPLE;  Surgeon: Annalee Dine Mir, MD;  Location: PEDS PROCEDURE ROOM [facility];  Service: Gastroenterology    PR COLONOSCOPY W/BIOPSY SINGLE/MULTIPLE N/A 12/26/2015    Procedure: COLONOSCOPY, FLEXIBLE, PROXIMAL TO SPLENIC FLEXURE; WITH BIOPSY, SINGLE OR MULTIPLE;  Surgeon: Annalee Dine Mir, MD;  Location: PEDS PROCEDURE ROOM [facility];  Service: Gastroenterology    PR COLONOSCOPY W/BIOPSY SINGLE/MULTIPLE N/A 09/02/2017    Procedure: COLONOSCOPY, FLEXIBLE, PROXIMAL TO SPLENIC FLEXURE; WITH BIOPSY, SINGLE OR MULTIPLE;  Surgeon: Annalee Dine Mir, MD;  Location: PEDS PROCEDURE ROOM [place];  Service: Gastroenterology    PR COLSC FLX W/REMOVAL LESION BY HOT BX FORCEPS N/A 08/27/2016    Procedure: COLONOSCOPY, FLEXIBLE, PROXIMAL TO SPLENIC FLEXURE; W/REMOVAL TUMOR/POLYP/OTHER LESION, HOT BX FORCEP/CAUTE;  Surgeon: Annalee Dine Mir, MD;  Location: PEDS PROCEDURE ROOM [facility];  Service: Gastroenterology    PR COLSC FLX W/RMVL OF TUMOR POLYP LESION SNARE TQ N/A 02/25/2019    Procedure: COLONOSCOPY FLEX; W/REMOV TUMOR/LES BY SNARE;  Surgeon: Lamar Jayson Ards, MD;  Location: GI PROCEDURES [place] [facility];  Service: Gastroenterology    PR COLSC FLX W/RMVL OF TUMOR POLYP LESION SNARE TQ N/A 03/13/2020    Procedure: COLONOSCOPY FLEX; W/REMOV TUMOR/LES BY SNARE;  Surgeon: Lamar Jayson Ards, MD;  Location: [facility]EDURES [place] [facility];  Service: Gastroenterology    PR ENDOSCOPY UPPER SMALL INTESTINE N/A 07/15/2023    Procedure: SMALL INTESTINAL ENDOSCOPY, ENTEROSCOPY BEYOND SECOND PORTION OF DUODENUM, NOT INCL ILEUM; DX, INCL COLLECTION OF SPECIMEN(S) BY BRUSHING OR WASHING, WHEN PERFORMED;  Surgeon: Charlanne Kipper, MD;  [facility]: [facility];  Service: Gastrointestinal    PR EXC SKIN BENIG 2.1-3 CM TRUNK,ARM,LEG Right 02/25/2017    Procedure: EXCISION, BENIGN LESION INCLUDE MARGINS, EXCEPT SKIN TAG, LEGS; EXCISED DIAMETER 2.1 TO 3.0 CM;  Surgeon: Jeyhan Suzan Wood, MD;  Location: CHILDRENS OR [facility];  Service: Plastics    PR EXC SKIN BENIG 3.1-4 CM TRUNK,ARM,LEG Right 02/25/2017    Procedure: EXCISION, BENIGN LESION INCLUDE MARGINS, EXCEPT SKIN TAG, ARMS; EXCISED DIAMETER 3.1 TO 4.0 CM;  Surgeon: Jeyhan Suzan Wood, MD;  Location: CHILDRENS OR [facility];  Service: Plastics PR EXC SKIN BENIG >4 CM FACE,FACIAL Right 02/25/2017    Procedure: EXCISION, OTHER BENIGN LES INCLUD MARGIN, FACE/EARS/EYELIDS/NOSE/LIPS/MUCOUS MEMBRANE; EXCISED DIAM >4.0 CM;  Surgeon: Jeyhan Suzan Wood, MD;  Location: CHILDRENS OR [facility];  Service: Plastics    PR EXC TUMOR SOFT TISSUE LEG/ANKLE SUBQ 3+CM Right 08/05/2019    Procedure: EXCISION, TUMOR, SOFT TISSUE OF LEG OR ANKLE AREA, SUBCUTANEOUS; 3 CM OR GREATER;  Surgeon: Delon Charmaine Myron, MD;  Location: MAIN OR [place];  Service: Plastics    PR EXC TUMOR SOFT TISSUE LEG/ANKLE SUBQ <3CM Right 08/05/2019    Procedure: EXCISION, TUMOR, SOFT TISSUE OF LEG OR ANKLE AREA, SUBCUTANEOUS; LESS THAN 3 CM;  Surgeon: Delon Charmaine Myron, MD;  Location: MAIN OR [facility];  Service: Plastics    PR LAP, SURG PROCTECTOMY W J-POUCH N/A 08/10/2020    Procedure: ROBOTIC ASSISTED LAPAROSCOPIC PROCTOCOLECTOMY, ILEAL [name], WITH OSTOMY;  Surgeon: Evalene Karalee Shilling, MD;  Location: OR [place];  Service: General Surgery    PR NDSC EVAL INTSTINAL POUCH DX W/COLLJ SPEC SPX N/A 01/23/2021    Procedure: ENDO EVAL SM INTEST POUCH; DX;  Surgeon: Mikle Cleora Martinez, MD;  Location: [facility] PROCEDURES [place] [facility];  Service: Gastroenterology    PR NDSC EVAL INTSTINAL POUCH DX W/COLLJ SPEC SPX N/A 08/27/2021    Procedure: ENDO EVAL SM INTEST POUCH; DX;  Surgeon: Rodgers JAYSON Minor, MD;  Location: [facility]URES [facility];  Service: Gastroenterology    PR NDSC EVAL INTSTINAL POUCH DX W/COLLJ SPEC SPX N/A 12/09/2021    Procedure: ENDO EVAL SM INTEST POUCH; DX;  Surgeon: Sherrill Liliane Olmsted, MD;  [facility]: [facility]URES [facility];  Service: Gastroenterology    PR NDSC EVAL INTSTINAL POUCH DX W/COLLJ [facility] SPX Left 04/09/2022    Procedure: ENDO EVAL SM INTEST POUCH; DX;  Surgeon: Martinez Mikle Cleora, MD;  Location: [facility] [place] [facility];  Service: Gastroenterology    PR NDSC EVAL INTSTINAL POUCH DX W/COLLJ SPEC SPX N/A 08/05/2022    Procedure: ENDO EVAL SM INTEST POUCH; DX; Surgeon: Martinez Mikle Cleora, MD;  Location: GI PROCEDURES [facility];  Service: Gastroenterology    PR NDSC EVAL INTSTINAL POUCH DX W/COLLJ SPEC SPX N/A 03/13/2023    Procedure: ENDO EVAL SM INTEST POUCH; DX;  Surgeon: Rogerio Lauraine Sor, MD;  Location: GI PROCEDURES [facility];  Service: Gastroenterology    PR NDSC EVAL INTSTINAL POUCH DX W/COLLJ SPEC SPX N/A 07/06/2023    Procedure: ENDO EVAL SM INTEST POUCH; DX;  Surgeon: Junette Fonda CROME, MD;  Location: GI PROCEDURES [facility];  Service: Gastroenterology    PR NDSC EVAL INTSTINAL POUCH DX W/COLLJ SPEC SPX N/A 10/14/2023    Procedure: ENDO EVAL SM INTEST POUCH; DX;  Surgeon: Brenita Vannie BIRCH, MD;  Location: GI PROCEDURES [facility];  Service: Gastroenterology    PR NDSC EVAL INTSTINAL POUCH W/BX SINGLE/MULTIPLE N/A 01/20/2022    Procedure: ENDOSCOPIC EVAL OF SMALL INTESTINAL POUCH; DIAGNOSTIC, No biopsies;  Surgeon: Gwenn Dallas Ruth, MD;  Location: GI PROCEDURES [facility];  Service: Gastroenterology    PR NDSC EVAL INTSTINAL POUCH W/BX SINGLE/MULTIPLE N/A 02/13/2022    Procedure: ENDOSCOPIC EVAL OF SMALL INTESTINAL POUCH; DIAGNOSTIC, WITH BIOPSY;  Surgeon: Georgine Arlean Min, MD;  Location: GI PROCEDURES [facility];  Service: Gastroenterology    PR NDSC EVAL INTSTINAL POUCH W/BX SINGLE/MULTIPLE N/A 03/13/2023    Procedure: ENDOSCOPIC EVAL OF SMALL INTESTINAL POUCH; DIAGNOSTIC, WITH BIOPSY;  Surgeon: Rogerio Lauraine Sor, MD;  Location: GI PROCEDURES [facility];  Service: Gastroenterology    PR UNLISTED PROCEDURE SMALL INTESTINE  01/23/2021    Procedure: UNLISTED PROCEDURE, SMALL INTESTINE;  Surgeon: Mikle Cleora Martinez, MD;  Location: [facility] [place] [facility];  Service: Gastroenterology    PR UNLISTED PROCEDURE SMALL INTESTINE  02/13/2022    Procedure: UNLISTED PROCEDURE, SMALL INTESTINE;  Surgeon: Georgine Arlean Min, MD;  Location: GI PROCEDURES [facility];  Service: Gastroenterology    PR UPPER GI ENDOSCOPY,BIOPSY N/A 10/27/2012    Procedure: UGI ENDOSCOPY; WITH BIOPSY, SINGLE OR MULTIPLE;  Surgeon: Annalee LABOR Mir, MD;  Location: PEDS PROCEDURE ROOM [facility];  Service: Gastroenterology    PR UPPER GI ENDOSCOPY,BIOPSY N/A 09/14/2013    Procedure: UGI ENDOSCOPY; WITH BIOPSY,  SINGLE OR MULTIPLE;  Surgeon: Annalee LABOR Mir, MD;  Location: PEDS PROCEDURE ROOM [facility];  Service: Gastroenterology    PR UPPER GI ENDOSCOPY,BIOPSY N/A 11/08/2014    Procedure: UGI ENDOSCOPY; WITH BIOPSY, SINGLE OR MULTIPLE;  Surgeon: Annalee Dine Mir, MD;  Location: PEDS PROCEDURE ROOM [facility];  Service: Gastroenterology    PR UPPER GI ENDOSCOPY,BIOPSY N/A 12/26/2015    Procedure: UGI ENDOSCOPY; WITH BIOPSY, SINGLE OR MULTIPLE;  Surgeon: Annalee Dine Mir, MD;  Location: PEDS PROCEDURE ROOM [facility];  Service: Gastroenterology    PR UPPER GI ENDOSCOPY,BIOPSY N/A 08/27/2016    Procedure: UGI ENDOSCOPY; WITH BIOPSY, SINGLE OR MULTIPLE;  Surgeon: Annalee Dine Mir, MD;  Location: PEDS PROCEDURE ROOM [name];  Service: Gastroenterology    PR UPPER GI ENDOSCOPY,BIOPSY N/A 09/02/2017    Procedure: UGI ENDOSCOPY; WITH BIOPSY, SINGLE OR MULTIPLE;  Surgeon: Annalee Dine Mir, MD;  Location: PEDS PROCEDURE ROOM [facility];  Service: Gastroenterology    PR UPPER GI ENDOSCOPY,BIOPSY N/A 03/13/2020    Procedure: UGI ENDOSCOPY; WITH BIOPSY, SINGLE OR MULTIPLE;  Surgeon: Lamar Jayson Ards, MD;  Location: [facility] [place] [facility];  Service: Gastroenterology    PR UPPER GI ENDOSCOPY,BIOPSY N/A 09/05/2021    Procedure: UGI ENDOSCOPY; WITH BIOPSY, SINGLE OR MULTIPLE;  Surgeon: Rosamond Never, MD;  Location: GI PROCEDURES [facility];  Service: Gastroenterology    PR UPPER GI ENDOSCOPY,DIAGNOSIS N/A 01/20/2022    Procedure: UGI ENDO, INCLUDE ESOPHAGUS, STOMACH, & DUODENUM &/OR JEJUNUM; DX W/WO COLLECTION SPECIMN, BY BRUSH OR WASH;  Surgeon: Gwenn Dallas Ruth, MD;  Location: GI PROCEDURES [facility];  Service: Gastroenterology    TUMOR REMOVAL      multiple-head, neck, back, hand, right flank, multiple   [4]   Family History  Problem Relation Age of Onset    No Known Problems Mother     No Known Problems Father     No Known Problems Sister     No Known Problems Brother     Stroke Maternal Grandmother     Other Maternal Grandmother         benign lesions of liver and pancreas, further details unknown    Cancer Maternal Grandmother     Diabetes Maternal Grandmother     Hypertension Maternal Grandmother     Thyroid disease Maternal Grandmother     Arthritis Maternal Grandfather     Asthma Maternal Grandfather     COPD Paternal Grandmother         Deceased    Miscarriages / Stillbirths Paternal Grandmother     Alcohol abuse Paternal Grandfather         Deceased    No Known Problems Maternal Aunt     No Known Problems Maternal Uncle     No Known Problems Paternal Aunt     No Known Problems Paternal Uncle     Anesthesia problems Neg Hx     Broken bones Neg Hx     Cancer Neg Hx     Clotting disorder Neg Hx     Collagen disease Neg Hx     Diabetes Neg Hx     Dislocations Neg Hx     Fibromyalgia Neg Hx     Gout Neg Hx     Hemophilia Neg Hx     Osteoporosis Neg Hx     Rheumatologic disease Neg Hx     Scoliosis Neg Hx     Severe sprains Neg Hx     Sickle cell anemia Neg Hx     Spinal  Compression Fracture Neg Hx     Melanoma Neg Hx     Basal cell carcinoma Neg Hx     Squamous cell carcinoma Neg Hx    [5]   Social History  Tobacco Use    Smoking status: Never     Passive exposure: Past    Smokeless tobacco: Never   Vaping Use    Vaping status: Never Used   Substance Use Topics    Alcohol use: Never    Drug use: Never

## 2023-10-21 NOTE — Unmapped (Signed)
 Shift Summary  PRN pain and antiemetic medications were administered multiple times overnight for persistent abdominal and pelvic pain and nausea.   Fall prevention strategies were consistently implemented, and no falls or injuries were reported during the shift.   Vital signs and oxygen  saturation remained stable, and the patient was able to ambulate independently as needed.   Morning labs revealed persistent anemia with low hemoglobin and hematocrit.   The patient remained stable throughout the shift with ongoing pain and anemia management.    Absence of Fall and Fall-Related Injury: No falls or injuries occurred during the shift, and fall prevention interventions such as low bed position, frequent visual checks, and use of nonskid footwear were maintained throughout the night with the patient remaining independent in mobility and ambulation.    Anemia Symptom Improvement: Hemoglobin and hematocrit remained low on morning labs, and pain related to the abdomen and pelvis persisted with frequent PRN pain medication use, while vital signs remained stable and oxygenation was maintained on room air.

## 2023-10-21 NOTE — Unmapped (Signed)
 Hospital Medicine Daily Progress Note    Assessment/Plan:    Principal Problem:    GI bleed  Active Problems:    [name] (familial adenomatous polyposis)    Intestinal polyps    History of colonic polyps    Syncope    History of colectomy    Lower abdominal pain        Please see wound care flowsheets for wound documentation     LOS: 8 days (under inpatient status)    [name] [name] is a 23 y.o. 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 In brief, her pain and nausea are multifactorial and goal is not complete resolution or aggressive diagnostics but symptomatic treatment and maintenance of function. Agreed to wean dose of hydromorphone  in expectation of discharge in 24 hours  -- NO CHANGES to pain regimen without multiparty discussion including patient, unless true emergency  -- for pain  -- oxycodone  (liquid) 20 mg q4 prn  -- DECREASE hydromorphone  0.5 mg IV q4 prn   -- buprenorphine  20 mcg/h patch   -- baclofen  5 mg tid  -- for nausea  -- ondansetron  IV prn  -- promethazine  po prn  -- scopolamine  patch  -- NO diphenhydramine  IV in absence of clear indication  -- for dyspepsia  -- famotidine  qhs  -- PPI IV q12  -- for motility  -- naloxegol  prn  -- loperamide  daily + prn; would likely benefit from titrating up    # Orthostatic intolerance  # Recurrent vasovagal syncope  Not resolved but improved over course of hospital stay with improvement in anemia and generous IV fluids. Has been able to walk laps of units without syncope though not totally without presyncopal symptoms. Random cortisol levels low (albeit not properly timed) and stimulated levels borderline -- if this is adrenal insufficiency would be mild, and as previously noted I think alternative explanations for symptoms can be offered. Appreciate input from endocrinology  -- endocrinology consulted, appreciated  -- 1L LR again, continue daily while inpatient  -- encourage activity OOB; discussed with nursing  -- okay to stop telemetry    # Lower GI bleeding  Ongoing scant bright red blood in stools, likely oozing from anastomosis, but Hb slowly rising (last 9.3)  -- follow CBC daily for now; decrease frequency if remains stable  -- no further endoscopy or imaging planned this admission  -- pursue [facility]oscopy at [facility] per outpatient team    # Iron  deficiency anemia  Due to chronic GI losses and inadequate dietary intake  -- has received iron  dextran 1 gram safely, with premedications per allergy  service  -- repeat CBC, retics +/- iron  studies in 2-4 weeks to assess response    # Desmoid tumors  -- HOLD nirogacestat  per oncology (confirmed 10/19/2023), given recent GI bleeding     # Mood / anxiety  -- continue escitalopram    -- lorazepam  0.5 mg bid prn (home medication)    Daily checklist:  VTE prophylaxis: low risk, PADUA score < 4; encourage ambulation  Diet: Nutrition Therapy Regular/House  Code status: Full Code  HCDM:   HCDM (patient stated preference): Cake,[name] - Mother - [phone]    HCDM, back-up (If primary HCDM is unavailable): Vienna, Folden - Father - [phone]    HCDM,  back-up (If primary HCDM is unavailable): Takaya, Hyslop - Sister - [phone]    Disposition: floor status. Not medically ready for discharge. No HH / DME needs anticipated at discharge.    PT/OT recommendations: (not recorded) / (not recorded)  DME needs: (not recorded) / (not recorded)    I personally spent 50 minutes face-to-face and non-face-to-face in the care of this patient, which includes all pre, intra, and post visit time on the date of service.  All documented time was specific to the E/M visit and does not include any procedures that may have been performed.    Please page [facility] [phone] with questions. This note reflects active problems and daily updates. Please refer to hospital course for additional details and resolved problems.    ___________________________________________________________________    Subjective:  Walked multiple laps of unit past 24 hours, without syncope  Still feeling lightheaded sometimes when out of bed  Pain ongoing in typical locations and typical quality  Discussed goal to discharge tomorrow thus need to wean pain medications    Discussed with nursing, case manager    Labs/Studies:  Labs and Studies from the last 24hrs per EMR and Reviewed    Objective:  Temp:  [36.8 ??C (98.2 ??F)] 36.8 ??C (98.2 ??F)  Pulse:  [71-88] 88  Resp:  [16-19] 19  BP: (97-111)/(59-64) 97/64  SpO2:  [97 %-99 %] 97 %    GEN: pale young woman lying in bed, not in distress  CV: RRR  PULM: unlabored in room air  ABD: soft, nondistended  EXT: ankles warm, no ankle edema  NEURO: alert and oriented to person place time, no focal deficits    R chest wall port is accessed, no erythema    ------------  Prentice SHAUNNA Search, MD PhD  [facility]

## 2023-10-22 LAB — CBC
HEMATOCRIT: 27.6 % — ABNORMAL LOW (ref 34.0–44.0)
HEMOGLOBIN: 9 g/dL — ABNORMAL LOW (ref 11.3–14.9)
MEAN CORPUSCULAR HEMOGLOBIN CONC: 32.6 g/dL (ref 32.0–36.0)
MEAN CORPUSCULAR HEMOGLOBIN: 25.3 pg — ABNORMAL LOW (ref 25.9–32.4)
MEAN CORPUSCULAR VOLUME: 77.8 fL (ref 77.6–95.7)
MEAN PLATELET VOLUME: 9.6 fL (ref 6.8–10.7)
PLATELET COUNT: 260 10*9/L (ref 150–450)
RED BLOOD CELL COUNT: 3.55 10*12/L — ABNORMAL LOW (ref 3.95–5.13)
RED CELL DISTRIBUTION WIDTH: 13.8 % (ref 12.2–15.2)
WBC ADJUSTED: 6.8 10*9/L (ref 3.6–11.2)

## 2023-10-22 LAB — BASIC METABOLIC PANEL
ANION GAP: 9 mmol/L (ref 5–14)
BLOOD UREA NITROGEN: 8 mg/dL — ABNORMAL LOW (ref 9–23)
BUN / CREAT RATIO: 10
CALCIUM: 8.8 mg/dL (ref 8.7–10.4)
CHLORIDE: 102 mmol/L (ref 98–107)
CO2: 32 mmol/L — ABNORMAL HIGH (ref 20.0–31.0)
CREATININE: 0.83 mg/dL (ref 0.55–1.02)
EGFR CKD-EPI (2021) FEMALE: >90 mL/min/1.73m2 (ref >=60)
GLUCOSE RANDOM: 100 mg/dL (ref 70–179)
POTASSIUM: 4 mmol/L (ref 3.4–4.8)
SODIUM: 143 mmol/L (ref 135–145)

## 2023-10-22 LAB — MAGNESIUM: MAGNESIUM: 2.2 mg/dL (ref 1.6–2.6)

## 2023-10-22 MED ORDER — LOPERAMIDE 2 MG CAPSULE
ORAL_CAPSULE | Freq: Four times a day (QID) | ORAL | 0 refills | 8.00000 days | Status: CP | PRN
Start: 2023-10-22 — End: ?
  Filled 2023-10-22: qty 30, 8d supply, fill #0

## 2023-10-22 MED ORDER — HYDROCORTISONE 10 MG TABLET
ORAL_TABLET | Freq: Every morning | ORAL | 0 refills | 90.00000 days
Start: 2023-10-22 — End: ?

## 2023-10-22 MED ORDER — PROMETHAZINE 12.5 MG TABLET
ORAL_TABLET | Freq: Four times a day (QID) | ORAL | 0 refills | 5.00000 days | Status: CP | PRN
Start: 2023-10-22 — End: ?
  Filled 2023-10-22: qty 20, 5d supply, fill #0

## 2023-10-22 MED ORDER — BACLOFEN 10 MG TABLET
ORAL_TABLET | Freq: Three times a day (TID) | ORAL | 0 refills | 14.00000 days | Status: CP
Start: 2023-10-22 — End: ?
  Filled 2023-10-22: qty 21, 14d supply, fill #0

## 2023-10-22 MED ADMIN — HYDROmorphone (DILAUDID) injection Syrg 0.5 mg: .5 mg | INTRAVENOUS | @ 16:00:00 | Stop: 2023-10-22

## 2023-10-22 MED ADMIN — baclofen (LIORESAL) tablet 5 mg: 5 mg | ORAL | @ 17:00:00 | Stop: 2023-10-22

## 2023-10-22 MED ADMIN — HYDROmorphone (DILAUDID) injection Syrg 0.5 mg: .5 mg | INTRAVENOUS | @ 02:00:00 | Stop: 2023-10-22

## 2023-10-22 MED ADMIN — escitalopram oxalate (LEXAPRO) tablet 5 mg: 5 mg | ORAL | @ 12:00:00 | Stop: 2023-10-22

## 2023-10-22 MED ADMIN — oxyCODONE (ROXICODONE) 5 mg/5 mL solution 20 mg: 20 mg | ORAL | @ 14:00:00 | Stop: 2023-10-22

## 2023-10-22 MED ADMIN — baclofen (LIORESAL) tablet 5 mg: 5 mg | ORAL

## 2023-10-22 MED ADMIN — HYDROmorphone (DILAUDID) injection Syrg 0.5 mg: .5 mg | INTRAVENOUS | @ 12:00:00 | Stop: 2023-10-22

## 2023-10-22 MED ADMIN — ondansetron (ZOFRAN) injection 4 mg: 4 mg | INTRAVENOUS | @ 02:00:00

## 2023-10-22 MED ADMIN — HYDROmorphone (DILAUDID) injection Syrg 0.5 mg: .5 mg | INTRAVENOUS | @ 07:00:00 | Stop: 2023-10-22

## 2023-10-22 MED ADMIN — baclofen (LIORESAL) tablet 5 mg: 5 mg | ORAL | @ 12:00:00 | Stop: 2023-10-22

## 2023-10-22 MED ADMIN — lidocaine (ASPERCREME) 4 % 1 patch: 1 | TRANSDERMAL | @ 12:00:00 | Stop: 2023-10-22

## 2023-10-22 MED ADMIN — heparin, porcine (PF) 100 unit/mL injection 500 Units: 500 [IU] | INTRAVENOUS | @ 16:00:00 | Stop: 2023-10-22

## 2023-10-22 MED ADMIN — hydrocortisone (CORTEF) tablet 10 mg: 10 mg | ORAL | @ 12:00:00 | Stop: 2023-10-22

## 2023-10-22 MED ADMIN — cetirizine (ZYRTEC) tablet 20 mg: 20 mg | ORAL | @ 12:00:00 | Stop: 2023-10-22

## 2023-10-22 MED ADMIN — lactated ringers bolus 1,000 mL: 1000 mL | INTRAVENOUS | @ 12:00:00 | Stop: 2023-10-22

## 2023-10-22 MED ADMIN — ondansetron (ZOFRAN) injection 4 mg: 4 mg | INTRAVENOUS | @ 12:00:00 | Stop: 2023-10-22

## 2023-10-22 MED ADMIN — famotidine (PEPCID) tablet 20 mg: 20 mg | ORAL

## 2023-10-22 MED ADMIN — pantoprazole (Protonix) injection 40 mg: 40 mg | INTRAVENOUS | @ 12:00:00 | Stop: 2023-10-22

## 2023-10-22 MED ADMIN — oxyCODONE (ROXICODONE) 5 mg/5 mL solution 20 mg: 20 mg | ORAL | @ 09:00:00 | Stop: 2023-10-22

## 2023-10-22 MED ADMIN — oxyCODONE (ROXICODONE) 5 mg/5 mL solution 20 mg: 20 mg | ORAL | @ 04:00:00 | Stop: 2023-10-27

## 2023-10-22 MED ADMIN — pantoprazole (Protonix) injection 40 mg: 40 mg | INTRAVENOUS | @ 02:00:00

## 2023-10-22 MED ADMIN — cetirizine (ZYRTEC) tablet 20 mg: 20 mg | ORAL

## 2023-10-22 MED ADMIN — oxyCODONE (ROXICODONE) 5 mg/5 mL solution 20 mg: 20 mg | ORAL | @ 18:00:00 | Stop: 2023-10-22

## 2023-10-22 NOTE — Unmapped (Signed)
 Chronic Pain Follow Up Note    Assessment and Plan  [name] [name] is a 23 y.o. female with past medical history significant for DVT (RUE, s/p Eliquis  x3 months), [name] syndrome s/p colectomy, desmoid tumor, anemia, and severe protein-calorie malnutrition who is being followed at [facility] for abdominal and right shoulder pain that is consistent with the diagnosis of cancer associated pain.    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 Pt has continued and now chronic pain related to her tumors, has R shoulder pain as well though this is better controlled now. Follows with [facility] Dr Loris as well as by [facility] oncology.    Unfortunately, [name] [name] has been experiencing continued severe abdominal pain. The etiology of her pain flare cycles is complex with likely multiple contributing factors.  The most likely explanation appears that she has intermittent pain flares that are caused by an unclear underlying process (potentially due to intermittent intestinal obstructions in the setting of her known mesenteric desmoid tumors, which may act as anchor points for torsion of her bowels) that resulted in an increased opioid requirement and often will lead to hospitalization.  Patient has been admitted several times for extended periods of time over the last year often for either pouchitis versus poor p.o. intake versus intermittent bowel obstruction versus pain flares. Persistent issues with her perception of bleeding through the GI tract and poor p.o. intake.     PLEASE SEE INPATIENT PAIN PLAN UNDER THE FYI TAB IN EPIC IF NEEDED        Visceral hyperalgesia, narcotic bowel syndrome, desmoid tumors of the gastrointestinal tract, cancer related pain, chronic pain syndrome, allodynia, generalized abdominal pain   Chronic, worsening.  Patient discharged a day prior to today's visit from the hospital for GI bleed.  While inpatient, patient understandably had relatively higher opioid requirements.  Addition to her baseline deep throbbing abdominal pain, patient now experiencing sharp and aching epigastric pain that radiates to the back as well as sharp and aching pelvic pain.  Since her discharge from the hospital, still has high opioid requirements.  Said taking her normal oxycodone  10 mg every 8 hours as needed, she is taking oxycodone  20 mg every 4 hours as needed, like she needed inpatient.  Patient continues to display appropriate behavior and verbalizes desire to wean as able.  Decision made to support patient's acute on chronic pain with higher doses of opioid at this time with plan to wean back to baseline when clinically able.  At this time, patient continues to have bright red blood per rectum and intense abdominal pain.    -Continue to follow with Cardiology regarding syncopal episodes   -Continue APAP 1000 mg 3 times daily  -Okay to continue ativan  0.5 mg p.o. prn for anxiety, patient will not take it at the same time as opioids or other sedating medications, takes infrequently  -Refilled oxycodone  10 mg tablets at 20 mg q4h prn, max 12 tabs per day for 1-2 weeks, then wean as tolerated, minimum 20 mg x5 doses per day (max 10 tabs)  -Continue Baclofen  5 mg 3 times daily, ok to take up to 10 mg TID  -Cotninue Butrans  20mg  weekly, refilled  -Continue Lidocaine  ointment  -Still working to wean off of all opioids    Chronic right upper extremity pain  Not primary concern at this time.  Radial nerve distrubution pain likely  due to known desmoid and flare up due to overuse. Negative for carpal tunnel at last visit. Describes symptoms consistent with flare of chronic R ulnar neuropathy as well.     - Continue Lidocaine  ointment  - Start Voltaren  gel    Myofascial pain   Chronic, ongoing, at goal. Not primary concern at this time. Had severe pain flare after previous desmoid injection.    - Home exercise program  - Encouraged SI joint exercises  - Encouraged slow resumption of normal activity and times when pain has decreased  - Apply Voltaren  gel BID to limit GI side effects  - Apply Lidocaine  patches, refilled  - Baclofen  5 mg 3 times daily, ok to take up to 10 mg TID    Chronic, continuous use of opioids; chronic pain syndrome   Patient is managed on both Butrans  patches for long-acting pain control as a partial mu agonist and oxycodone  for pain flares. Working to wean patient off of oxycodone  and then to work patient down and eventually off of buprenorphine  to improve bowel function while still balancing the need for pain control.  She has successfully weaned to oxycodone  20 mg TID on average. She still has severe abdominal pain, especially when having GI bleeds, so we discussed trying to further wean off opioids; she expressed understanding. Patient is managed on a benzodiazepine for anxiety and knows not to take this around the time of her opioid and is working to wean this off as well.    I have reviewed the [facility] Medical Board statement on use of controlled substances for the treatment of pain as well as the CDC Guideline for Prescribing Opioids for Chronic Pain. I have reviewed the [place] Controlled Substance Monitoring Database.    Long Term Use of Opioid Medication   - Compliant, using opioid to facilitate functional goals, no concerns for misuse     PDMP reviewed 10/23/2023 Appropriate   Pill count 10/23/2023 Appropriate      Urine toxicology 12/23/22 Appropriate +oxy, +benzo, +buprenorphine    Treatment agreement 02/18/23 Goals: continue with nursing school   Naloxone  rx Date 12/23/22     COMM 08/05/23 7 - higher due to recent hospitalization will update at next visit, will obtain at next in-person visit   Benzodiazepine Yes Ok with minimal use per psychiatry, pt will avoid taking close to opioid doses and only while awake, not near bedtime   Pain Psychology Yes Now seeing Becca   QTC 10/13/23 NSR, Qtc 450   Concerns yes OIH, planning to wean off opioids, planning to minimize benzo use   Opioid Risk MODERATE- see comment Given dose and higher COMM, but working to wean and patient is amenable     Opioid Changes and Yearly MME  Date MME Comments   12/23/22 45 + 1.5 mg BID of Suboxone  Post-discharge regimen, increased suboxone  to decrease oxy   01/21/23 45 + 1 mg TID Suboxone  + additional 10 mg oxy per day x 10 days Split Suboxone  to TID dosing for pain, gave additional #10 oxy 10 mg for additional 10 mg oxy per day for 10 days after wisdom tooth extractions   02/18/23 75 + 20 mcg/h Butrans  Increased to oxy 10 mg x4-5 per day for 2-3 weeks given patient's current pain flare in setting of dramatically increased BM frequency, switched to Butrans  given pt's anxiety surrounding Suboxone  and desire for more consistent analgesia with Butrans  that will also minimize pill burden; plan to wean off of oxy hereafter, Butrans  is a  technical net decrease from Suboxone    05/07/23 180 + 20 mcg/h Butrans  Discharged on 20 mg liquid oxycodone  q4h PRN + Butrans  20 mcg/h given severe pain that has persisted as an outpatient and thus requirement remains, she is willing to work towards weaning   08/05/23 90-150 + 20 mch/hr Butrans  Higher than historical average given recent pain flare and admission but lower than last visit, working to wean   09/22/23 90 + 20 mcg/h Planning to wean down to 45 + butrans  45 by next visit   10/23/23 180 + 20 mch/h Butrans  Increased to maximum outpatient dose given continued severe pain post-discharge, trying to avoid rehospitalization, tolerates this dose as outpatient and inpatient with no issues, planning for close follow up and wean to 20 mg q4h PRN but max 5 doses per day before RTC     Return for within 1 month, ok to be virtual.    PLAN:  Today I have prescribed:  Requested Prescriptions     Signed Prescriptions Disp Refills    buprenorphine  20 mcg/hour PTWK transdermal patch 4 patch 2     Sig: Place 1 patch on the skin every seven (7) days.    oxyCODONE  10 mg TbOr 294 tablet 0     Sig: Take 20 mg by mouth every four (4) hours. DNF: 10/24/23     No orders of the defined types were placed in this encounter.     HPI  [name] [name] is a 23 y.o. being followed at [facility] for complaint of chronic pain localized to her shoulders and abdomen.      Patient discharged from yesterday from the hospital, where she was treated for 10 days for GI bleed.  Her GI bleed was unfortunately complicated by severe orthostatic hypotension and acute on chronic abdominal pain.  She underwent cauterization of hemorrhagic lesions and was given multiple infusions of iron  with overall stabilization of GI bleed and hypotension.  However, patient reports her abdominal pain is much worse since her discharge.  She reports her baseline chronic RLQ abdominal pain is still there, but is now accompanied by new sharp and aching epigastric pain that radiates to her back as well as sharp pelvic pain.  Patient is unclear if this pain is related to procedural interventions well inpatient.  Given this new abdominal pain, patient has been taking her oxycodone  differently.  Instead of taking oxycodone  10 mg every 8 hours as needed, she is now taking oxycodone  20 mg every 4 hours as needed.  This new dose and frequency of oxycodone  is similar to what she was requiring for pain control inpatient.  Because of this, patient is running low on her prescribed oxycodone .    Since her discharge from the hospital, patient reports she was initially doing pretty good up until this morning.  Patient reports her orthostatic hypotension has returned and she has been experiencing bright red blood filling her toilet bowl again.  She is now taking steroids by instruction of inpatient endocrinology for what they believe is mild adrenal insufficiency.  Patient states is too early to tell whether or not it is eating and her orthostatic hypotension, however she reports it is certainly not contributing to any pain control.  Patient denies any red flag symptoms associated with opioid use.  After extensive discussion and shared decision making, decision made to refill oxycodone  with more tablets available given patient's increased pain level.  Patient verbalized understanding of how to safely wean oxycodone  dose and  dosing intervals safely.  Buprenorphine  patches with benefit.  Buprenorphine  patches refilled as well. Patient appears appropriate and motivated to wean opioid as tolerated. Will see patient within the next month to reassess and closely monitor oxycodone  taper.       Current view: Showing all answers           [facility] Return Patient Questionnaire       Question 10/23/2023  2:18 PM EDT - Filed by Patient    What is the reason for your visit? Medication Refill    Date of onset of your pain:       Please rate your pain at its WORST in the past month. 10    Please rate your pain at its LEAST in the past month. 3    Please rate your pain as it is RIGHT NOW. 7    Please rate your pain on AVERAGE in the past month. 7    Please circle the location of your pain.       Please select the words that describe your pain. Aching     Pulling     Pulsing     Stabbing Tender Throbbing    How often do you have pain? All the time    When is your pain the worst? Middle of the night    Which of the following have been negatively affected by your pain? General activity     Normal work     Recreational activities     Sleep    Since your last visit:     Have you had any of the following? Hospitalization     Emergency Room visit    Do you have any new pain you would like to discuss with your doctor? Yes    How has your pain changed? Worse    Are you currently taking any blood-thinners or anticoagulants? No    If you are on Pain Medication - Are you having any of the following? My medications help to improve my quality of live    If you have had a procedure since your last visit, how much pain relief  was obtained?       If you have had a procedure since your last visit, were there any complications?       General: Fatigue     Difficulty Sleeping    Cardiovascular:       Gastrointestinal - (Intestinal): Bloody or Black Stool     Nausea    Skin:       Endocrine (Hormonal System):       Musculoskeletal System - (Muscles, Joints and Coverings):       Neurologic: Fainting    Psychiatric: Anxiety          [facility] Video Visit Questionnaire       Question 10/23/2023  2:18 PM EDT - Filed by Patient 06/11/2023  2:31 PM EDT - Fredricka by Patient 05/13/2023 12:10 PM EDT - Fredricka by Patient    Virtual Care Guidelines Accept Accept Accept             Current analgesic regimen:  -Butrans  patch 20 mcg/h  -Oxycodone  20 mg TID  -Ativan  PRN, now approximately 2 times per week  -Tylenol  1000 mg 3 times daily PRN  -Voltaren  1% gel  -Lidocaine  ointment  -Lidocaine  patch  -Baclofen  5 mg 3 times daily      Previous Medication Trials:  NSAIDS: Toradol  (c/f GIB after)  Antidepressant/anxiety: Cymbalta  (  stopped due to mood side effects), amitriptyline  (insomnia and increased J-pouch output)  Anticonvulsants: Gabapentin , Lyrica  (both cause lower extremity edema)  Muscle relaxants: Robaxin , baclofen  (most effective of the conventional therapies), p.o. Valium  (attempting to limit due to opioid dependence)  Topicals: lidocaine , voltaren  gel  Short-acting opiates: oxycodone , PO HM  Long-acting opiates: Buprenorphine   Other: Tylenol     Previous Interventions  Procedures:   - TPIs in August 2023 with initial pain flare, subsequent improvement, and near immediate relief flare of pain; hyperalgesic with procedure  - TPIs of Right trapezius, rhomboid, infraspinatus on 12/04/2021.  - TPIs on 10/25  - Celiac plexus block on 11/1 (severe post-procedural pain flare requiring admission for pain control, significant hyperalgesia)  - TPIs 11/22  - Last TPI>1 YR AGO    Infusions:  Lidocaine  infusions helped during inpatient exacerbations of pain more than ketamine  infusions    PT: Yes    Allergies  Allergies   Allergen Reactions    Adhesive Tape-Silicones Hives and Rash     Paper tape and Tegederm ok. Biopatch causes blistering but has tolerated CHG Tegaderm    Ferrlecit  [Sodium Ferric Gluconat-Sucrose] Swelling and Rash    Levofloxacin  Swelling and Rash     Swelling in mouth, rash,     Methylnaltrexone       Per Patient: I lost bowel control, severe abdominal cramping, and elevated BP    Neomycin Swelling     Rxn after ear drops; ear swelling    Papaya Hives    Morphine Nausea And Vomiting    Zosyn [Piperacillin-Tazobactam] Itching and Rash     Red and itchy    Adhesive      Other Reaction(s): Not available    Compazine [Prochlorperazine] Other (See Comments)     Extreme agitation    Iron  Analogues     Metoclopramide  Diarrhea     Other Reaction(s): Not available    Reglan  [Metoclopramide  Hcl] Diarrhea    Iron  Dextran Itching     Received iron  dextran 06/08/22 over 12 hours, had itching and redness/flushing during the infusion and for a couple days after. Required IV benadryl  w/flares between doses and ultimately treated w/IV methylpred for 2 days.     Latex, Natural Rubber Rash       Home Medications    Current Outpatient Medications   Medication Sig Dispense Refill    acetaminophen  (TYLENOL  EXTRA STRENGTH) 500 MG tablet Take 2 tablets (1,000 mg total) by mouth every eight (8) hours.      baclofen  (LIORESAL ) 10 MG tablet Take 0.5 tablets (5 mg total) by mouth Three (3) times a day. 21 tablet 0    [START ON 10/30/2023] buprenorphine  20 mcg/hour PTWK transdermal patch Place 1 patch on the skin every seven (7) days. 4 patch 2    butalbital -acetaminophen -caffeine  (ESGIC ) 50-325-40 mg per tablet Take 1 tablet by mouth two (2) times a day as needed for headache. 20 tablet 0    cetirizine  (ZYRTEC ) 10 MG tablet Take 1 tablet (10 mg total) by mouth two (2) times a day. After iron  infusions      escitalopram  oxalate (LEXAPRO ) 5 MG tablet Take 1 tablet (5 mg total) by mouth daily. 30 tablet 2    famotidine  (PEPCID ) 20 MG tablet Take 1 tablet (20 mg total) by mouth nightly. 30 tablet 3    fluticasone  propionate (FLONASE ) 50 mcg/actuation nasal spray Use 1 spray under the butrans  patch once every 7 days to prevent allergic reaction to the adhesive  hydrocortisone  (CORTEF ) 10 MG tablet Take 1 tablet (10 mg total) by mouth every morning. 90 tablet 1    lidocaine  (ASPERCREME) 4 % patch Place 1 patch on the skin daily. Apply for 12 hours then remove for 12 hours. 30 patch 0    lidocaine  (XYLOCAINE ) 2 % Soln Mix with Children's Benadryl  and Maalox. Swish and spit every 4 hours as needed for mouth pain 100 mL 1    lidocaine  (XYLOCAINE ) 5 % ointment Apply to affected area daily 37 g 3    loperamide  (IMODIUM ) 2 mg capsule Take 1 capsule (2 mg total) by mouth four (4) times a day as needed. 30 capsule 0    LORazepam  (ATIVAN ) 0.5 MG tablet Take 1 tablet (0.5 mg total) by mouth two (2) times a day as needed for anxiety. 10 tablet 0    naloxegol  (MOVANTIK ) 12.5 mg tablet Take 1 tablet (12.5 mg total) by mouth daily as needed (constipation).      naloxone  (NARCAN ) 4 mg nasal spray One spray in either nostril once for known/suspected opioid overdose. May repeat every 2-3 minutes in alternating nostril til EMS arrives 1 each 0    [Paused] nirogacestat  (OGSIVEO ) 100 mg tablet Take 1 tablet (100 mg total) by mouth two (2) times a day. Swallow tablets whole; do not break, crush, or chew. 60 tablet 5    ondansetron  (ZOFRAN -ODT) 8 MG disintegrating tablet Dissolve 1 tablet (8 mg total) in the mouth every eight (8) hours as needed for nausea. 30 tablet 2    [START ON 10/24/2023] oxyCODONE  10 mg TbOr Take 20 mg by mouth every four (4) hours. DNF: 10/24/23 294 tablet 0    pantoprazole  (PROTONIX ) 40 MG tablet Take 1 tablet (40 mg total) by mouth Two (2) times a day (30 minutes before a meal). 60 tablet 0    promethazine  (PHENERGAN ) 12.5 MG tablet Take 1 tablet (12.5 mg total) by mouth every six (6) hours as needed. 20 tablet 0    scopolamine  (TRANSDERM-SCOP) 1 mg over 3 days Place 1 patch (1 mg total) on the skin every third day. 10 patch 0    sucralfate  (CARAFATE ) 100 mg/mL suspension Take 10 mL (1 g total) by mouth four (4) times a day.      topiramate  (TOPAMAX ) 25 MG capsule Take 1 capsule (25 mg total) by mouth two (2) times a day. 60 capsule 2     No current facility-administered medications for this visit.     Review Of Systems  See above in questionnaire     Physical Exam    VITALS:   Virtual visit     Wt Readings from Last 3 Encounters:   10/15/23 54.7 kg (120 lb 9.6 oz)   10/06/23 55 kg (121 lb 4.1 oz)   09/29/23 54.9 kg (121 lb)     GENERAL:  The patient is well developed, well-nourished and appears to be in no apparent distress. The patient is pleasant and interactive. Patient is a good historian.  HEENT:    Reveals normocephalic/atraumatic. Clear sclera. Mucous membranes are moist.  RESPIRATORY:   Normal work of breathing.  No supplemental oxygen  seen  NEUROLOGIC:    The patient was alert and oriented, speech fluent, normal language.   MUSCULAR:  Unable to assess given virtual visit  SKIN:   No obvious rashes, lesions, or erythema.  PSYCHIATRIC:  Appropriate mood and affect.  No pressured speech.        The patient reports they are physically located  in [place]  and is currently: at home. I conducted a audio/video visit. I spent 34 minutes on the video call with the patient. I spent an additional 30 minutes on pre- and post-visit activities on the date of service .     This was a telehealth service and I was in person with the resident.  As the attending physician, I spent 34 minutes in medical discussion with the patient via real-time audio and video, participating in the key portions of the service.  I spent an additional 30 minutes on pre- and post-visit activities which were specific to the patient and included reviewing the patient???s medical records, lab results, imaging results, and other pertinent records. I reviewed the resident's note and I agree with the resident's findings and plan.    I saw and evaluated the patient, participating in the key portions of the service.  I reviewed the resident's note and agree with the findings and plan.      Vinie Campbell Delores Mickey, MD  Assistant Professor of Anesthesiology  Department of Anesthesiology   Divisions of General Anesthesiology and Pain Medicine

## 2023-10-22 NOTE — Unmapped (Signed)
 Shift Summary  PRN pain medications including oxyCODONE  and HYDROmorphone  were administered multiple times for persistent pain.   Family remained at bedside and fall prevention interventions were maintained, with no reported falls or injuries.   Syncope was documented and anemia labs were drawn, revealing low hemoglobin and hematocrit.   Cardiac monitoring continued with normal sinus rhythm noted on telemetry.   Overall, pain remained a challenge and anemia symptoms persisted, but safety interventions were consistently upheld.    Absence of Fall and Fall-Related Injury: Hourly visual checks and scheduled toileting were consistently performed, and the fall reduction program was maintained with family at bedside throughout the shift; no falls or injuries were documented.    Anemia Symptom Improvement: Hemoglobin and hematocrit remained low on CBC, and syncope was documented during the shift; pain was managed with PRN medications, but pain scores fluctuated and remained elevated.

## 2023-10-22 NOTE — Unmapped (Signed)
 Pain Psychology Contact Note:        Patient: [name] [name] ([id number])   Supervising Clinical Psychologist: Greig Holland, PhD   Psychology Fellow: Asberry [place]. [place], PhD   Date: October 22, 2023   Time: 30 minutes   Time of Day: 11:00AM  Method: Inpatient face-to-face      [name] [name] a 23 year old White woman with PMH significant [name] syndrome s/p colectomy, desmoid tumor, anemia, and severe protein-calorie malnutrition. She is followed in the outpatient setting by pain anesthesiologist, Arvella Daring, MD, for chronic abdominal and right shoulder pain. [name] [name] was recently admitted for GI bleed (10/13/2023). Our team is following her now to provide supportive psychotherapy as she remains inpatient.      Objective and Behavioral Observations:   [name] [name] was painting in bed when the psychology fellow entered the room. [name] [name] reported stable mood today and is anticipating discharge home this afternoon. She discussed recent updates with course work changes and some disappointment around adjusting her schedule to minimize impacts on her health and over activity. She describes positive emotions around going home and seeing her service dog. She has continued to walk several laps around the unit daily since the last visit.     Supportive counseling and CBT were employed throughout the visit. Adaptive coping strategies were reinforced. Activity pain cycle reviewed and pacing and small goal setting was discussed in detail re course work with school starting soon. [name] [name] will continue to seek support from her boyfriend, close friend, and family. We discussed follow-up with pain psychology, and [name] [name] is open to follow-up in the outpatient setting. She will discuss with Dr. Daring, pain specialist, tomorrow at her outpatient pain clinic visit. She expressed gratitude for the visit today.     Mental Status Exam:   Appearance: No concerns noted   Motor: No concerns noted   Speech/language: No concerns noted     Assessment:   [name] [name] was engaged throughout the entirety of the session. Her mood was euthymic with congruent affect. She endorsed mild anxiety and ongoing pain that appears to be well-managed with current pain regimen and coping strategies. She is open to pacing and small goal setting. She denied SI/HI. She feels supported by family and her medical team, and she expressed interest in continued follow-up with our team.      Plan: Current plan is for [name] [name] to be discharged today. Fellow, Abundio [place], will continue to see her when she is admitted inpatient. Plan and options for outpatient follow-up discussed today. [name] [name] was encouraged to let Dr. Daring or pain team know if she would like to establish outpatient services. Plan for her to discuss this with him tomorrow during their clinic visit.      Asberry Sudan, PhD   Psychology Postdoctoral Fellow     Greig Holland, PhD

## 2023-10-22 NOTE — Unmapped (Signed)
 Physician Discharge Summary [facility]  [address] [facility]  [address]  [place] [address]  Dept: [phone]  Loc: [phone]     Identifying Information:   [name] [name]  2000-09-15  [id number]    Primary Care Physician: Job Lukes, [facility]   Code Status: Full Code    Admit Date: 10/13/2023    Discharge Date: 10/22/2023     Discharge To: Home    Discharge Service: [facility]     Discharge Attending Physician: Prentice SHAUNNA Search, MD     Discharge Diagnoses:  Principal Problem:    GI bleed (POA: Yes)  Active Problems:    [name] (familial adenomatous polyposis) (POA: Yes)    Intestinal polyps (POA: Yes)    History of colonic polyps (POA: Not Applicable)    Syncope (POA: Yes)    History of colectomy (POA: Not Applicable)    Lower abdominal pain (POA: Yes)  Resolved Problems:    * No resolved hospital problems. *      Outpatient follow-up items:  [ ]  Repeat CBC, retics in about 1 month to assess response to IV iron   [ ]  Titrate loperamide  to effect; previously recommended by GI and she had been hesitant to try but took a few doses in hospital with no ill effect  [ ]  Follow up with endocrinology for repeat evaluation of HPA axis  [ ]  Delay MRI right wrist for about 1 month after iron  dextran    Hospital course:  [name] [name] is a 23 y.o. 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    Briefly: She underwent pouch exam 10/14/2023 which noted superficial ulceration around the ileoanal anastomosis which was presumed to be the source of blood loss, and treated with APC. She also received IV iron  (with pre- and post-medication) and IV fluids. Hemoglobin and reticulocyte count began to rise and orthostatic symptoms improved. Hospital course was complicated by flare of chronic abdominal pain and nausea, as frequently occurs during her hospitalizations, but she was discharged without changes to her pain plan. A preliminary diagnosis of secondary adrenal insufficiency was made.    Details below by problem.    # Lower GI bleeding  # Acute on chronic anemia related to GI losses and severe iron  deficiency  Small-volume hematochezia is an intermittent issue for her, generally attributed to either inflammatory changes in the distal neo-ileum or oozing from ulceration around the ileoanal anastomosis. She has chronic anemia (recent baseline Hb ~10-11) related in part to iron  deficiency which in turn is related to chronic GI losses and dietary insufficiency. In this instance, presented with increased volume and frequency of hematochezia associated with decline in Hb 9.7 > 8.0 in first 48 hours of hospital stay. GI service was consulted and performed pouch exam which noted circumferential shallow ulceration around the ileoanal anastomosis, scattered aphthae proximal to the anastomosis, but otherwise healthy appearing mucosa with no active bleeding. Ulcerations treated with APC to reduce risk of bleeding. Severe iron  deficiency was confirmed (serum iron  5, iron  saturation 3%) and a dose of IV iron  was recommended. Because of infusion reactions with multiple different iron  preparations, allergy  service was consulted and recommended premedication with cetirizine  20 mg twice daily for 48 hours prior to infusion and at  least 72 hours after; prednisone  50 mg x 2 doses at 13 and 7 hours prior to infusion then methylprednisolone  40 mg IV immediately prior to infusion; and diphenhydramine  50 mg IV, famotidine  40 mg IV 30 minutes prior to infusion. She received iron  dextran 1 g without serious complication and only mild urticaria as breakthrough symptom. Hb rose to 9 by time of discharge. She will continue follow-up as scheduled with outpatient GI, and is anticipated to have ongoing need for IV iron . Please note that she received iron  dextran safely and does not require hospital admission for future IV iron  infusions provided appropriate premedications are available.    # Acute on chronic abdominal pain and nausea  Complex situation discussed at length in prior notes. In brief, her pain and nausea are multifactorial -- presumably due to in part to prior abdominal surgeries and altered motility as well as tethering of the small bowel due to desmoid tumor in the mesentery, but also with substantial functional overlay. Goal is not complete resolution or aggressive diagnostics but symptomatic treatment and maintenance of function. Outpatient pain regimen includes buprenorphine  patch, baclofen , lidocaine  patches, oxycodone . Pain management was somewhat challenging, and similar to prior admissions, she was treated with hydromorphone  up to 1 mg IV every 4 hours. There was some improvement in her pain by time of discharge, and no new opioids were prescribed. Recommend ongoing follow-up with consistent group of providers including Dr Delores (pain management).    # Orthostatic intolerance  # Recurrent vasovagal syncope  Not a prominent part of her constellation of chronic symptoms until spring 2025, but increasing in frequency over the past month or so. During prior hospital admission, convulsive movements were noted after syncopal events and she was evaluated for possible seizure with MRI and video EEG. Spells were felt to be nonepileptiform. POTS or similar functional orthostatic intolerance was suspected and she was referred to cardiology (08/20/2023). Their opinion was that her orthostatic intolerance and recurrent syncope could be adequately explained by malnutrition, inadequate fluid intake to keep up with stool losses from chronic diarrhea, and deconditioning. She and her mother have noticed that events tend to be more frequent at times of increased pain, anxiety, etc. She receives IV fluids intermittently as an outpatient per a treatment plan from her oncologist. During this hospital stay, she had multiple syncopal events, several of which were captured while on telemetry. No likely significant arrhythmias were noted. We attempted to mitigate her symptoms with generous use of IV fluids and a trial of loperamide  (as previously recommended by multiple members of her outpatient team) to decrease stool losses. She enjoyed considerable symptomatic benefit from IV fluids and although she continued to have some orthostatic dizziness, was able to walk several laps around the unit each day prior to discharge. See below regarding possibility of mild adrenal insufficiency. We discussed conservative treatments for orthostatic intolerance (which would also apply as first-line treatment for POTS) including aggressive hydration with goal of 2 to 3 L/day, increase salt intake, over-the-counter electrolyte supplements and/or sports drinks, abdominal binder, compression stockings, graded increase in physical activity.    # Secondary adrenal insufficiency, mild  As part of evaluation for causes of orthostatic symptoms, checked random morning cortisol which was 0.9. Response to ACTH stimulation was subnormal: 4.1 (0 min) > 12.2 (60 min) > 13.1 (90 min). We note that she received enteral budesonide  for about 6 weeks in May and June 2025. Endocrinology service was consulted and felt that results of ACTH stimulation test, with  recent iatrogenic steroid exposure and orthostatic symptoms, did represent mild secondary adrenal insufficiency. We discussed with her and her mother that treatment with basal hydrocortisone  (10 mg daily) may offer some symptomatic benefit but is primarily intended to offer safety during times of physiologic stress. Sick day dosing and emergency injectable hydrocortisone  were not felt to be necessary. She will follow-up with endocrinology for repeat evaluation of the HPA axis to guide discontinuation of physiologic steroids in 6-8 weeks.    # Desmoid tumors  Currently treated with nirogacestat . This agent may cause GI bleeding, so oncology service recommended stopping while inpatient. She will follow-up with her primary oncologist, Dr Loris, to discuss how and when to resume.     # Mood / anxiety  Continued escitalopram . Previous admissions, she benefited from meeting with pain psychology team, massage therapy, activity therapy, etc.      Procedures:  J-pouch exam  ______________________________________________________________________  Discharge Medications:     Your Medication List        PAUSE taking these medications      OGSIVEO  100 mg tablet  Wait to take this until your doctor or other care provider tells you to start again.  Generic drug: nirogacestat   Take 1 tablet (100 mg total) by mouth two (2) times a day. Swallow tablets whole; do not break, crush, or chew.            START taking these medications      hydrocortisone  10 MG tablet  Commonly known as: CORTEF   Take 1 tablet (10 mg total) by mouth every morning.  Start taking on: October 23, 2023     loperamide  2 mg capsule  Commonly known as: IMODIUM   Take 1 capsule (2 mg total) by mouth four (4) times a day as needed.     promethazine  12.5 MG tablet  Commonly known as: PHENERGAN   Take 1 tablet (12.5 mg total) by mouth every six (6) hours as needed.            CHANGE how you take these medications      baclofen  10 MG tablet  Commonly known as: LIORESAL   Take 0.5 tablets (5 mg total) by mouth Three (3) times a day.  What changed: additional instructions            CONTINUE taking these medications      acetaminophen  500 MG tablet  Commonly known as: TYLENOL  EXTRA STRENGTH  Take 2 tablets (1,000 mg total) by mouth every eight (8) hours.     butalbital -acetaminophen -caffeine  50-325-40 mg per tablet  Commonly known as: ESGIC   Take 1 tablet by mouth two (2) times a day as needed for headache. cetirizine  10 MG tablet  Commonly known as: ZYRTEC   Take 1 tablet (10 mg total) by mouth two (2) times a day. After iron  infusions     escitalopram  oxalate 5 MG tablet  Commonly known as: LEXAPRO   Take 1 tablet (5 mg total) by mouth daily.     famotidine  20 MG tablet  Commonly known as: PEPCID   Take 1 tablet (20 mg total) by mouth nightly.     fluticasone  propionate 50 mcg/actuation nasal spray  Commonly known as: FLONASE   Use 1 spray under the butrans  patch once every 7 days to prevent allergic reaction to the adhesive     lidocaine  2 % Soln  Commonly known as: XYLOCAINE   Mix with Children's Benadryl  and Maalox. Swish and spit every 4 hours as needed for mouth pain     lidocaine   4 % patch  Commonly known as: ASPERCREME  Place 1 patch on the skin daily. Apply for 12 hours then remove for 12 hours.     lidocaine  5 % ointment  Commonly known as: XYLOCAINE   Apply to affected area daily     LORazepam  0.5 MG tablet  Commonly known as: ATIVAN   Take 1 tablet (0.5 mg total) by mouth two (2) times a day as needed for anxiety.     naloxegol  12.5 mg tablet  Commonly known as: MOVANTIK   Take 1 tablet (12.5 mg total) by mouth daily as needed (constipation).     naloxone  4 mg/actuation nasal spray  Commonly known as: NARCAN   One spray in either nostril once for known/suspected opioid overdose. May repeat every 2-3 minutes in alternating nostril til EMS arrives     ondansetron  8 MG disintegrating tablet  Commonly known as: ZOFRAN -ODT  Dissolve 1 tablet (8 mg total) in the mouth every eight (8) hours as needed for nausea.     pantoprazole  40 MG tablet  Commonly known as: Protonix   Take 1 tablet (40 mg total) by mouth Two (2) times a day (30 minutes before a meal).     scopolamine  1 mg over 3 days  Commonly known as: TRANSDERM-SCOP  Place 1 patch (1 mg total) on the skin every third day.     sucralfate  100 mg/mL suspension  Commonly known as: CARAFATE   Take 10 mL (1 g total) by mouth four (4) times a day.     topiramate  25 MG capsule  Commonly known as: Topamax   Take 1 capsule (25 mg total) by mouth two (2) times a day.              Allergies:  Adhesive tape-silicones; Ferrlecit  [sodium ferric gluconat-sucrose]; Levofloxacin ; Methylnaltrexone ; Neomycin; Papaya; Morphine; Zosyn [piperacillin-tazobactam]; Adhesive; Compazine [prochlorperazine]; Iron  analogues; Metoclopramide ; Reglan  [metoclopramide  hcl]; Iron  dextran; and Latex, natural rubber  ______________________________________________________________________  Pending Test Results (if blank, then none):      Most Recent Labs:  Recent Labs     Units 10/18/23  1024 10/18/23  1424 10/20/23  0410 10/21/23  0555 10/22/23  0301   WBC 10*9/L 7.3   < > 8.0 7.4 6.8   RBC 10*12/L 3.24*   < > 3.54* 3.64* 3.55*   HGB g/dL 8.1*   < > 9.0* 9.3* 9.0*   HCT % 24.7*   < > 27.0* 28.5* 27.6*   PLT 10*9/L 241   < > 265 278 260   LYMPHOPCT % 40.0  --   --   --   --    LYMPHSABS 10*9/L 2.9  --   --   --   --    MONOPCT % 14.0  --   --   --   --    MONOSABS 10*9/L 1.0*  --   --   --   --    EOSPCT % 2.0  --   --   --   --    EOSABS 10*9/L 0.1  --   --   --   --    BASOPCT % 0.6  --   --   --   --    BASOSABS 10*9/L 0.0  --   --   --   --     < > = values in this interval not displayed.       Recent Labs     Units 10/18/23  1024 10/19/23  0502 10/20/23  0410 10/21/23  9444  10/22/23  0301   NA mmol/L 144   < > 142 145 143   K mmol/L 3.8   < > 3.5 3.8 4.0   CL mmol/L 109*   < > 104 105 102   CO2 mmol/L 29.0   < > 30.0 31.0 32.0*   BUN mg/dL 7*   < > 8* 8* 8*   CREATININE mg/dL 9.45*   < > 9.47* 9.47* 0.83   GLU mg/dL 91   < > 869 99 899   CALCIUM mg/dL 9.1   < > 8.9 9.0 8.8   ALBUMIN g/dL 3.1*  --   --   --   --    PROT g/dL 5.5*  --   --   --   --    BILITOT mg/dL 0.2*  --   --   --   --    ALKPHOS U/L 49  --   --   --   --    ALT U/L 13  --   --   --   --    AST U/L <8  --   --   --   --     < > = values in this interval not displayed.     Recent Labs     Units 10/20/23  0410 10/21/23  0555 10/22/23  0301 MG mg/dL 2.1 2.2 2.2       Relevant Studies/Radiology (if blank, then none):  XR Abdomen 1 View  Result Date: 10/19/2023  EXAM: XR ABDOMEN 1 VIEW ACCESSION: [id number] UN REPORT DATE: 10/19/2023 8:43 AM CLINICAL INDICATION: 23 years old with ABDOMINAL PAIN   -  LEFT LOWER QUADRANT  COMPARISON: CT abdomen pelvis 10/13/2023. TECHNIQUE: Supine view of the abdomen, 2 image(s) FINDINGS: Nonobstructive bowel gas pattern. Gas-filled, nondilated large bowel. Surgical material projects over the pelvis and right lower quadrant. Lung bases are unremarkable. No acute osseous abnormality.     No evidence of bowel obstruction.    Pouch Exam  Result Date: 10/14/2023  _______________________________________________________________________________ Patient Name: Sanaai Doane         Procedure Date: 10/14/2023 4:35 PM MRN: [id number]                     Date of Birth: 2000-03-22 Admit Type: Inpatient                 Age: 63 Room: [facility] OR 04 [facility]          Gender: Female Note Status: Finalized                Instrument Name: [id number] _______________________________________________________________________________  Procedure:             Pouchoscopy Indications:           Hematochezia Providers:             VANNIE CHARM LANDAU, MD, GERRIT BRAUN, MD (Fellow), IZETTA SISKIN, BASCOM HILARIO CLAUDE Referring MD:          [name], MD (Referring MD) Medicines:             Monitored Anesthesia Care Complications:         No immediate complications. _______________________________________________________________________________ Procedure:             Pre-Anesthesia Assessment:                        -  Prior to the procedure, a History and Physical was                        performed, and patient medications and allergies were                        reviewed. The patient's tolerance of previous                        anesthesia was also reviewed. The risks and benefits                        of the procedure and the sedation options and risks                        were discussed with the patient. All questions were                        answered, and informed consent was obtained. Prior                        Anticoagulants: The patient has taken no anticoagulant                        or antiplatelet agents. ASA Grade Assessment: II - A                        patient with mild systemic disease. After reviewing                        the risks and benefits, the patient was deemed in                        satisfactory condition to undergo the procedure.                        After obtaining informed consent, the endoscope was                        passed under direct vision. Throughout the procedure,                        the patient's blood pressure, pulse, and oxygen                         saturations were monitored continuously. The Endoscope                        was introduced through the ileoanal anastomosis via                        the anus and advanced to the the neo-terminal ileum.                        The procedure was performed without difficulty. The                        patient tolerated the procedure well.  Findings:      Patient is status-post total colectomy with an ileal pouch-anal      anastomosis.      The perianal and digital rectal examinations were normal.      There was a rectal cuff beginning at at 1 cm from the anal verge,      characterized by healthy appearing mucosa. The cuff extended 2 cm in      length.      There was an ileal pouch 3 cm from the anal verge. This was      characterized by healthy appearing mucosa in most of the pouch. However,      there was near circumferential, superficial ulceration around the      anastomosis as has been documented on previous exams. Coagulation for      tissue destruction and prevention of bleeding using argon plasma at 0.5      liters/minute and 20 watts was successful in treating the ulcerated      areas.      A few areas of scattered small aphthae were found in the pre-pouch      ileum, but the rest of the mucosa appeared entirely normal on exam of      the distal 30 cm of neo-terminal ileum.                                                                                Estimated Blood Loss:      Estimated blood loss was minimal. Impression:            - Rectal cuff with healthy appearing mucosa seen.                        - Ileal pouch with healthy appearing mucosa seen.                        Treated with argon plasma coagulation (APC).                        - Scattered aphthae were found in the pre-pouch ileum.                        - No specimens collected. Recommendation:        - Further recommendations can be made by the GI                        consult team. Please contact them if necessary.                        - Continue present medications.                        - Resume previous diet.                        - Return patient to hospital ward for ongoing care.  Procedure Code(s):     --- Professional ---                        [phone], Endoscopic evaluation of small intestinal pouch                        (eg, Kock pouch, ileal reservoir [S or J]);                        diagnostic, including collection of specimen(s) by                        brushing or washing, when performed (separate                        procedure)                        44799, Unlisted procedure, small intestine Diagnosis Code(s):     --- Professional ---                        Z98.0, Intestinal bypass and anastomosis status                        K91.850, Pouchitis                        K92.1, Melena (includes Hematochezia) CPT copyright 2023 American [employer] Association. All rights reserved. The codes documented in this report are preliminary and upon coder review may be revised to meet current compliance requirements. Electronically Signed By Vannie Landau, MD _________________ VANNIE BIRCH REDD, MD 10/14/2023 5:31:50 PM The attending physician was present throughout the entire procedure including the insertion, viewing, and removal of the endoscope. This procedure note has been electronically signed by: VANNIE LANDAU , MD __________________ GERRIT BRAUN, MD Number of Addenda: 0 Note Initiated On: 10/14/2023 4:35 PM    ECG 12 Lead  Result Date: 10/13/2023  NORMAL SINUS RHYTHM NORMAL ECG WHEN COMPARED WITH ECG OF 20-Aug-2023 15:10, NO SIGNIFICANT CHANGE WAS FOUND Confirmed by Claudene Legions ([id number]) on 10/13/2023 9:33:15 PM    CT Abdomen Pelvis W IV Contrast Only  Result Date: 10/13/2023  EXAM: CT ABDOMEN PELVIS W CONTRAST ACCESSION: [id number] UN REPORT DATE: 10/13/2023 2:09 PM CLINICAL INDICATION: 23 years old with [name], desmoid tumors, h/o colectomy with abd pain and increase bleeding from pouch  COMPARISON: CT ABDOMEN PELVIS W CONTRAST 07/10/2023, CT ABDOMEN PELVIS W CONTRAST 07/04/2023, CT ABDOMEN PELVIS W CONTRAST 10/21/2022 TECHNIQUE: A helical CT scan of the abdomen and pelvis was obtained following IV contrast from the lung bases through the pubic symphysis. Images were reconstructed in the axial plane. Coronal and sagittal reformatted images were also provided for further evaluation. FINDINGS: LOWER CHEST: Visualized lung bases are clear. No pleural or pericardial effusion. LIVER: Normal liver contour. Focal steatosis again noted along the falciform ligament. Unchanged tiny hypodensity along the right hepatic dome, previously characterized as cyst on MRI. BILIARY: The gallbladder is contracted but otherwise normal in appearance. No biliary ductal dilatation.  SPLEEN: Normal in size and contour. PANCREAS: Normal pancreatic contour.  No focal lesions.  No ductal dilation. ADRENAL GLANDS: Normal appearance of the adrenal glands. KIDNEYS/URETERS: Symmetric renal enhancement.  No hydronephrosis. No solid renal mass. Bilateral subcentimeter hypodensities, too small to characterize.  BLADDER: Mildly distended. No wall thickening. REPRODUCTIVE ORGANS: The uterus is present. No suspicious adnexal lesions. Unchanged small cystic lesion involving the left labia which is better visualized on recent MRI. GI TRACT: Postsurgical changes of proctocolectomy and ileoanal anastomosis with overall similar wall thickening compared to recent MR and CT. The anastomosis is again noted to be fluid-filled and distended with mild inflammatory stranding, new from prior. No findings of bowel obstruction or acute inflammation. PERITONEUM, RETROPERITONEUM AND MESENTERY: Similar size and appearance of central mesenteric mass measuring approximately 3.3 x 2.5 cm in greatest axial dimensions, previously 3.3 x 2.4 cm (2:89). There is associated tethering between this mass and multiple loops of small bowel. No ascites.  No fluid collection. LYMPH NODES: Multiple prominent but not enlarged central mesenteric and retroperitoneal lymph nodes are again noted. VESSELS: Hepatic and portal veins are patent.  Normal caliber aorta.  BONES and SOFT TISSUES: No aggressive osseous lesions. Unchanged size of right inferior rectus intramuscular mass measuring approximately 2.6 x 1.3 cm (2:121), previously 2.6 cm. Additional ill-defined nodularity along the rectus muscle appears unchanged. Bilateral soft tissue nodules along the left flank and bilateral gluteal regions are unchanged. Similar thickening of the right rectus femoris muscle with ill-defined inflammatory changes along the fascial planes along the the right quadriceps and hip girdle, similar over multiple prior examinations.     Mild inflammatory stranding noted along the ileoanal anastomosis which may represent proctitis in the appropriate clinical setting. Similar appearing ill-defined enhancing soft tissue lesion in the central mesentery measuring up to 3.3 cm. This likely represents a desmoid tumor as part of the FAAP complex. Unchanged enhancing lesion in the inferior right rectus sheath musculature and additional areas of soft tissue nodularity in the left rectus. Ill-defined enlargement of the left rectus femoris muscle inflammatory stranding along the fascial planes of the quadriceps and hip girdle. Stable soft tissue nodules involving the left flank, bilateral gluteal regions and left labia. Additional chronic and incidental findings as detailed in the body of the report.    ______________________________________________________________________  Discharge Instructions:         Follow Up instructions and Outpatient Referrals     Call MD for:  persistent dizziness or light-headedness      Call MD for:  persistent nausea or vomiting      Call MD for:  severe uncontrolled pain      Call MD for: Temperature > 38.5 Celsius ( > 101.3 Fahrenheit)      Discharge instructions          Other Instructions       Call MD for:  persistent dizziness or light-headedness      Call MD for:  persistent nausea or vomiting      Call MD for:  severe uncontrolled pain      Call MD for: Temperature > 38.5 Celsius ( > 101.3 Fahrenheit)      Discharge instructions      You were admitted to [facility] for lower GI bleeding, probably due to oozing around the old suture line for the J-pouch. This was cauterized. You remain anemic but blood counts have improved and you received IV iron . Syncopal episodes became less frequent after IV iron  and plenty of IV fluids. We also diagnosed you with (mild) adrenal insufficiency and recommend starting a low dose of hydrocortisone  daily.    KEY MEDICATION CHANGES  * START loperamide  (Immodium) 2 mg = 1 pill, up to 4 times per day as needed to slow down  stools  * START hydrocortisone  10 mg = 1 pill, once daily in the morning    Continue other usual medications. Discuss with Dr Loris when to resume nirogacestat . Good luck at school this semester!    Discharge instructions to patient: Call your primary care doctor and make an appointment to see them:      Within 2 weeks from the time you are discharged from the hospital            Appointments which have been scheduled for you      Oct 23, 2023 3:30 PM  (Arrive by 3:00 PM)  OFFICE VISIT with Vinie Campbell Delores Mickey., MD  [facility] PAIN MANAGEMENT CENTER [facility] [address] [facility] [place] [facility] REGION) [address]  [address]  [place] [address]  [phone]        Nov 03, 2023 11:30 AM  (Arrive by 11:00 AM)  LAB ONLY [place] with ADULT ONC LAB  [facility] ADULT ONCOLOGY LAB DRAW STATION [facility] REGION) [address]  [place] [address]  [phone]        Nov 03, 2023 12:30 PM  (Arrive by 12:00 PM)  RETURN SARCOMA with Lang Eldonna Loris, MD  [facility] ONCOLOGY MULTIDISCIPLINARY 2ND FLR [facility] REGION) [address]  [place] [address]  [phone]        Nov 03, 2023 1:45 PM  (Arrive by 1:15 PM)  AYA INFUSION ONLY with [facility] CHAIR [address]  [facility] ONCOLOGY INFUSION [facility] REGION) [address]  [place] [address]  [phone]        Dec 21, 2023 2:30 PM  (Arrive by 2:15 PM)  RETURN GENERAL with Elsie Marcey Cody DOUGLAS, MD  [facility] PHYSICAL MEDICINE [facility] [address] [place] [facility] REGION) [phone] [address] [place] [address]  [phone]        Jan 27, 2024 9:00 AM  (Arrive by 8:45 AM)  RETURN ENDOCRINE with Chiquita Folk, MD  [facility] DIABETES AND ENDOCRINOLOGY [place] [facility] REGION) [address]  [facility] 1 through 4  [place] [address]  [phone]        Feb 03, 2024 3:00 PM  (Arrive by 2:45 PM)  RETURN VIDEO VISIT DIRECT LINK with Mikle Cleora Martinez, MD  [facility] GI MEDICINE [place])  Arrive at: This is a Video Visit [address]  [facility] 1 through 4  [place] [address]  [phone]   A direct link will be sent to you by your provider at the time of your video appointment. Please do NOT go to the clinic.     For your video visit, you will need a computer with a working camera, speaker and microphone, a smartphone, or a tablet with internet access.              ______________________________________________________________________  Discharge Day Services:  BP 109/67  - Pulse 88  - Temp 36.6 ??C (97.9 ??F) (Oral)  - Resp 18  - Ht 165.1 cm (5' 5)  - Wt 54.7 kg (120 lb 9.6 oz)  - SpO2 97%  - BMI 20.07 kg/m??     Pt seen on the day of discharge and determined appropriate for discharge.    Condition at Discharge: stable    Length of Discharge: I spent greater than 30 mins in the discharge of this patient.     ------------  Prentice SHAUNNA Search, MD PhD  [facility]

## 2023-10-23 ENCOUNTER — Telehealth: Payer: Self-pay | Admitting: *Deleted

## 2023-10-23 ENCOUNTER — Encounter
Admit: 2023-10-23 | Discharge: 2023-10-24 | Payer: BLUE CROSS/BLUE SHIELD | Attending: Student in an Organized Health Care Education/Training Program | Primary: Student in an Organized Health Care Education/Training Program

## 2023-10-23 DIAGNOSIS — G893 Neoplasm related pain (acute) (chronic): Principal | ICD-10-CM

## 2023-10-23 DIAGNOSIS — G894 Chronic pain syndrome: Principal | ICD-10-CM

## 2023-10-23 DIAGNOSIS — R1084 Generalized abdominal pain: Principal | ICD-10-CM

## 2023-10-23 MED ORDER — HYDROCORTISONE 10 MG TABLET
ORAL_TABLET | Freq: Every morning | ORAL | 1 refills | 90.00000 days | Status: CP
Start: 2023-10-23 — End: ?
  Filled 2023-10-22: qty 90, 90d supply, fill #0

## 2023-10-23 NOTE — Unmapped (Addendum)
 It was a pleasure seeing you virtually in clinic today!    I sent in prescriptions to refill your oxycodone  and butrans  patches.    Your pain level is understandably worse since discharge from the hospital. As discussed in detail at our visit, begin taking your oxycodone  20mg  q4h PRN with plan to wean back down to Oxycodone  10mg  TID over the next several weeks.    We will see you again within a month!

## 2023-10-23 NOTE — Transitions of Care (Post Inpatient/ED Visit) (Signed)
   10/23/2023  Name: Lisa Donovan MRN: [id number] DOB: [birth year]  Today's TOC FU Call Status: Today's TOC FU Call Status:: Unsuccessful Call (1st Attempt) Unsuccessful Call (1st Attempt) Date: 10/23/23  Attempted to reach the patient regarding the most recent Inpatient/ED visit.  Follow Up Plan: Additional outreach attempts will be made to reach the patient to complete the Transitions of Care (Post Inpatient/ED visit) call.   Mliss Creed [facility], BSN RN Care Manager/ Transition of Care [place]/ [facility] [phone]

## 2023-10-24 MED ORDER — OXYCODONE 10 MG TABLET,ORAL ONLY (NOT FEEDING TUBES)
ORAL_TABLET | ORAL | 0 refills | 30.00000 days | Status: CP
Start: 2023-10-24 — End: 2023-11-23

## 2023-10-25 DIAGNOSIS — D48119 Desmoid tumor: Principal | ICD-10-CM

## 2023-10-26 ENCOUNTER — Telehealth: Payer: Self-pay | Admitting: *Deleted

## 2023-10-26 DIAGNOSIS — R710 Precipitous drop in hematocrit: Secondary | ICD-10-CM

## 2023-10-26 NOTE — Telephone Encounter (Signed)
 Copied from CRM #[id number]. Topic: Clinical - Request for Lab/Test Order >> Oct 26, 2023  1:31 PM Mesmerise C wrote: Reason for CRM: Patient's mother Nathanel wanting to a CBC test to be done to check hemoglobin she can be reached at [phone] once done

## 2023-10-26 NOTE — Telephone Encounter (Signed)
 Left detailed message on Lisa Donovan's personal voicemail, orders have been placed for CBC just need to call back and schedule lab appt only. Any questions call back. Orders put in Epic.

## 2023-10-26 NOTE — Telephone Encounter (Signed)
 Please see message and advise

## 2023-10-26 NOTE — Addendum Note (Signed)
 Addended by: THURMON ARLAND PARAS on: 10/26/2023 02:58 PM   Modules accepted: Orders

## 2023-10-26 NOTE — Transitions of Care (Post Inpatient/ED Visit) (Signed)
   10/26/2023  Name: Lisa Donovan MRN: [id number] DOB: [birth year]  Today's TOC FU Call Status: Today's TOC FU Call Status:: Unsuccessful Call (2nd Attempt) Unsuccessful Call (2nd Attempt) Date: 10/26/23  Attempted to reach the patient regarding the most recent Inpatient/ED visit.  Follow Up Plan: Additional outreach attempts will be made to reach the patient to complete the Transitions of Care (Post Inpatient/ED visit) call.   Mliss Creed [facility], BSN RN Care Manager/ Transition of Care [place]/ [facility] [phone]

## 2023-10-27 ENCOUNTER — Ambulatory Visit: Payer: Self-pay | Admitting: Physician Assistant

## 2023-10-27 ENCOUNTER — Other Ambulatory Visit (INDEPENDENT_AMBULATORY_CARE_PROVIDER_SITE_OTHER)

## 2023-10-27 ENCOUNTER — Other Ambulatory Visit: Payer: Self-pay | Admitting: Physician Assistant

## 2023-10-27 ENCOUNTER — Telehealth: Payer: Self-pay | Admitting: *Deleted

## 2023-10-27 DIAGNOSIS — R710 Precipitous drop in hematocrit: Secondary | ICD-10-CM

## 2023-10-27 LAB — CBC WITH DIFFERENTIAL/PLATELET
Basophils Absolute: 0 K/uL (ref 0.0–0.1)
Basophils Relative: 0.4 % (ref 0.0–3.0)
Eosinophils Absolute: 0.1 K/uL (ref 0.0–0.7)
Eosinophils Relative: 1.5 % (ref 0.0–5.0)
HCT: 32.3 % — ABNORMAL LOW (ref 36.0–46.0)
Hemoglobin: 10.2 g/dL — ABNORMAL LOW (ref 12.0–15.0)
Lymphocytes Relative: 23.4 % (ref 12.0–46.0)
Lymphs Abs: 1.9 K/uL (ref 0.7–4.0)
MCHC: 31.5 g/dL (ref 30.0–36.0)
MCV: 82.8 fl (ref 78.0–100.0)
Monocytes Absolute: 0.8 K/uL (ref 0.1–1.0)
Monocytes Relative: 10.1 % (ref 3.0–12.0)
Neutro Abs: 5.4 K/uL (ref 1.4–7.7)
Neutrophils Relative %: 64.6 % (ref 43.0–77.0)
Platelets: 225 K/uL (ref 150.0–400.0)
RBC: 3.9 Mil/uL (ref 3.87–5.11)
RDW: 20.6 % — ABNORMAL HIGH (ref 11.5–15.5)
WBC: 8.3 K/uL (ref 4.0–10.5)

## 2023-10-27 LAB — COMPREHENSIVE METABOLIC PANEL WITH GFR
ALT: 12 U/L (ref 0–35)
AST: 16 U/L (ref 0–37)
Albumin: 4 g/dL (ref 3.5–5.2)
Alkaline Phosphatase: 51 U/L (ref 39–117)
BUN: 11 mg/dL (ref 6–23)
CO2: 32 meq/L (ref 19–32)
Calcium: 9.4 mg/dL (ref 8.4–10.5)
Chloride: 102 meq/L (ref 96–112)
Creatinine, Ser: 0.56 mg/dL (ref 0.40–1.20)
GFR: 128.2 mL/min (ref >60.00)
Glucose, Bld: 86 mg/dL (ref 70–99)
Potassium: 4.1 meq/L (ref 3.5–5.1)
Sodium: 140 meq/L (ref 135–145)
Total Bilirubin: 0.2 mg/dL (ref 0.2–1.2)
Total Protein: 6.4 g/dL (ref 6.0–8.3)

## 2023-10-27 NOTE — Transitions of Care (Post Inpatient/ED Visit) (Signed)
 10/27/2023  Name: Lisa Donovan MRN: [id number] DOB: 06/17/2000  Today's TOC FU Call Status: Today's TOC FU Call Status:: Successful TOC FU Call Completed TOC FU Call Complete Date: 10/27/23 Patient's Name and Date of Birth confirmed.  Transition Care Management Follow-up Telephone Call Date of Discharge: 10/22/23 Discharge Facility: Other [facility]) Name of Other (Non[facility]) Discharge Facility: [facility] Primary Inpatient Discharge Diagnosis:: Gastrointestinal bleeding How have you been since you were released from the hospital?: Better (pt reports  feeling better  appetite is good, continues to have GI bleeding through [name], MD at [facility] aware, has endoscopy scheduled for 8/20, had labs today @ primary care) Any questions or concerns?: Yes Patient Questions/Concerns:: would like standing order to have labs/ fluids adminstered at local MD office instead of going to [facility] Patient Questions/Concerns Addressed: Notified Provider of Patient Questions/Concerns  Items Reviewed: Did you receive and understand the discharge instructions provided?: Yes Medications obtained,verified, and reconciled?: Yes (Medications Reviewed) Any new allergies since your discharge?: No Dietary orders reviewed?: Yes Type of Diet Ordered:: regular Do you have support at home?: Yes People in Home [RPT]: parent(s) Name of Support/Comfort Primary Source: [name] and Chyrl Fallen pareents  Medications Reviewed Today: Medications Reviewed Today     Reviewed by Aura Mliss LABOR, RN (Registered Nurse) on 10/27/23 at 1409  Med List Status: <None>   Medication Order Taking? Sig Documenting Provider Last Dose Status Informant  acetaminophen  (TYLENOL ) 500 MG tablet [id number] Yes Take 1,000 mg by mouth every 6 (six) hours as needed for moderate pain. [provider]  Active Mother, Pharmacy Records  ammonium lactate (LAC-HYDRIN) 12 % lotion [id number] Yes Apply 1 Application topically in  the morning and at bedtime. [provider]  Active Mother, Pharmacy Records  baclofen (LIORESAL) 10 MG tablet [id number] Yes Take 1 tablet by mouth 2 (two) times daily as needed (muscle pains). [provider]  Active Mother, Pharmacy Records  buprenorphine [facility]) 20 MCG/HR [place] [id number] Yes Place 1 patch onto the skin once a week. [provider]  Active   escitalopram (LEXAPRO) 5 MG tablet [id number] Yes Take 5 mg by mouth daily. [provider]  Active   famotidine  (PEPCID ) 20 MG tablet [id number] Yes Take 1 tablet (20 mg total) by mouth 2 (two) times daily. Theadore Ozell HERO, MD  Active Mother, Pharmacy Records  fluticasone [facility]) 50 MCG/ACT nasal spray [id number] Yes 1 spray. [provider]  Active   hydrocortisone  (ANUSOL -HC) 2.5 % rectal cream [id number] Yes Place rectally 3 (three) times daily. Sherrill Cable [place], [place]  Active Mother, Pharmacy Records  lidocaine  4 % [id number] Yes Place 2 patches onto the skin as needed (pain). [provider]  Active Mother, Pharmacy Records  LORazepam  (ATIVAN ) 1 MG tablet [id number] Yes Take 1 mg by mouth 2 (two) times daily as needed. [provider]  Active   mirtazapine  (REMERON ) 15 MG tablet [id number]  Take 15 mg by mouth at bedtime.  Patient not taking: Reported on 10/27/2023   [provider]  Active Mother, Pharmacy Records           Med Note IDAMAE ALFREIDA CROME   Fri Dec 12, 2022  7:00 PM) Medication currently on hold while taking Valium .  naloxegol oxalate (MOVANTIK) 25 MG TABS tablet [id number] Yes Take 25 mg by mouth daily. Taking only as needed [provider]  Active Mother, Pharmacy Records           Med Note IDAMAE, TYELISHA L  Fri Dec 12, 2022  7:03 PM) Need to follow up with doctor about continuing this medication.  naloxone (NARCAN) nasal spray 4 mg/0.1 mL [id number] Yes Place 1 spray into the nose once. [provider]  Active Mother, Pharmacy  Records           Med Note IDAMAE ALFREIDA CROME   Fri Dec 12, 2022  7:02 PM) Patient has not had to use this medication.  nirogacestat hydrobromide (OGSIVEO) 50 MG tablet [id number] Yes Take 150 mg by mouth 2 (two) times daily. [provider]  Active Mother, Pharmacy Records  ondansetron  (ZOFRAN -ODT) 8 MG disintegrating tablet [id number] Yes Take 1 tablet (8 mg total) by mouth every 8 (eight) hours as needed for nausea or vomiting. Job Lukes, [place]  Active Mother, Pharmacy Records           Med Note IDAMAE ALFREIDA CROME   Fri Dec 12, 2022  6:36 PM) Was given by [facility] when admitted.  oxyCODONE  (OXY IR/ROXICODONE ) 5 MG immediate release tablet [id number] Yes Take 10-15 mg by mouth 2 (two) times daily as needed for moderate pain. [provider]  Active Mother, Pharmacy Records  pantoprazole  (PROTONIX ) 40 MG tablet [id number] Yes Take 40 mg by mouth daily. [provider]  Active Mother, Pharmacy Records  promethazine  (PHENERGAN ) 12.5 MG tablet [id number] Yes Take 12.5 mg by mouth every 6 (six) hours as needed for nausea or vomiting. [provider]  Active Mother, Pharmacy Records  scopolamine  (TRANSDERM-SCOP) 1 MG/3DAYS [id number] Yes Place 1 patch (1.5 mg total) onto the skin every 3 (three) days. Job Lukes, [place]  Active   Zinc  Oxide (DESITIN [place]) [id number] Yes Apply 1 application. topically 2 (two) times daily as needed (irriation). [provider]  Active Mother, Pharmacy Records            Home Care and Equipment/Supplies: Were Home Health Services Ordered?: No Any new equipment or medical supplies ordered?: No  Functional Questionnaire: Do you need assistance with bathing/showering or dressing?: No Do you need assistance with meal preparation?: No Do you need assistance with eating?: No Do you have difficulty maintaining continence: No Do you need assistance with getting out of bed/getting out of a chair/moving?: No Do you have  difficulty managing or taking your medications?: Yes (patient's mother assists as needed)  Follow up appointments reviewed: PCP Follow-up appointment confirmed?: No (pt had labwork today @ primary care provider, has multiple appointments with specialists, declines to make appointment with primary care provider) MD Provider Line Number:[phone] Given: No Specialist Hospital Follow-up appointment confirmed?: Yes Date of Specialist follow-up appointment?: 11/03/23 Follow-Up Specialty Provider:: Dr. Loris @ [facility] oncology Do you need transportation to your follow-up appointment?: No Do you understand care options if your condition(s) worsen?: Yes-patient verbalized understanding  SDOH Interventions Today    Flowsheet Row Most Recent Value  SDOH Interventions   Food Insecurity Interventions Intervention Not Indicated  Housing Interventions Intervention Not Indicated  Transportation Interventions Intervention Not Indicated  Utilities Interventions Intervention Not Indicated    Mliss Creed [place], BSN RN Care Manager/ Transition of Care / [facility] [phone]

## 2023-10-28 DIAGNOSIS — D48119 Desmoid tumor: Principal | ICD-10-CM

## 2023-10-29 ENCOUNTER — Telehealth: Payer: Self-pay | Admitting: *Deleted

## 2023-10-29 NOTE — Patient Outreach (Signed)
 10/29/2023  Patient ID: Lisa Donovan, female   DOB: [birth year], 23 y.o.   MRN: [id number]  Received in basket message from primary care provider stating they can accommodate for pt to have labs completed at their office and it is okay for [facility] oncologist to send standing orders if oncologist is in agreement. Telephone call to [facility], message left for new patient coordinator requesting a return phone call, left message reporting pt wishes to have standing order from [facility] oncologist to receive IV fluids as needed due to the long trip to [facility] is difficult for the pt.  Awaiting return phone call. Telephone call to [facility] to see what is the best way to send orders from [facility], spoke with [place], they prefer orders be faxed to [phone] and also send electronically in epic.  Mliss Creed [facility], BSN RN Care Manager/ Transition of Care [place]/ [facility] [phone]

## 2023-10-30 ENCOUNTER — Telehealth: Payer: Self-pay | Admitting: *Deleted

## 2023-10-30 MED ORDER — BUPRENORPHINE 20 MCG/HOUR WEEKLY TRANSDERMAL PATCH
MEDICATED_PATCH | TRANSDERMAL | 2 refills | 28.00000 days | Status: CP
Start: 2023-10-30 — End: ?

## 2023-10-30 NOTE — Patient Outreach (Signed)
 10/30/2023  Patient ID: Lisa Donovan, female   DOB: 11-27-2000, 23 y.o.   MRN: [id number]  Received message from Lesley at [facility] stating pt cannot receive IV fluids at their facility unless she sees one of their providers.  Telephone call to patient, unable to reach, left voicemail on patient's mother's phone [facility]) reporting pt can have blood work completed at primary care provider office and oncologist at [facility] will need to fax order to [phone] and also send an electronic order through epic, ask [name] to write this information down and give to oncologist at [facility] for Tuesday 11/03/23 appointment,  also informed pt cannot receive IV fluids at [facility] unless she sees a provider there, also informed pt cannot receive IV fluids at primary care provider office.  Mliss Creed [facility], BSN RN Care Manager/ Transition of Care [place]/ [facility] [phone]

## 2023-11-02 NOTE — Unmapped (Signed)
 [facility] BONE AND SOFT TISSUE ONCOLOGY RETURN VISIT    Encounter Date: 11/03/2023  Patient Name: [name] [name]  Medical Record Number: [id number]    Referring Physician: Freddrick, None Per Patient  [address] 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    Desmoid fibromatosis, history of [name]     She has had numerous sites of disease, including paraspinal, chest wall, right arm/wrist. She has had several cryoablations with Dr. Babara which have seemed to offer clinical benefit. She trialed sorafenib  which seemed to lead to shrinkage of lesions, but she had significant intolerance with dizziness, presyncope, fatigue, hair loss, rash, GI side effects.      We have discussed at length the nature of desmoid fibromatosis tumors, the fact they can wax and wane independent of therapy, are not cancers in a metastatic potential, but can be locally infiltrative and cause substantial morbidity to the site of disease and occasionally mortality. I reviewed that the emerging standard of care is observation based on the fact that 20-30% will experience spontaneous regression in the year following diagnosis. Given her long experience with desmoids, complete regression may be less likely, but we will still approach this cautiously with parallel goals of avoidance of overtreatment (particularly surgical) and palliation of symptoms / preservation of function.     At first visit in 2022, her lesions were in the shoulder, right forearm, chest area, with a possible desmoid in her left thigh. Since then, she has had waxing waning course with significant pain issues, malnutrition requiring intermittent TPN and tube feeds, multiple GI bleeds, significant anxiety, depression, multiple hospitalizations. Had tumor response to sorafenib  but developed GI bleeding and had to stop. IV iron  infusion led to severe anaphylaxis.    09/16/22: Start nirogacestat .     09/25/22: Admitted with uncontrolled pain and muscle spasms after IR guided procedure. Prolonged hospital stay, neurologic evaluation, managed with muscle relaxers (baclofen , valium ). Discharge 9/11 to rehab. Ramped up on nirogacestat  with some side effects, discharged from rehab 10/2.    03/25/23 - 05/07/23: Prolonged, complex admission for pain, malnutrition. Found to have strep mitis blood culture. NG tube placed, TPN not recommended. Discharged to rehab, but then readmitted with C Diff, treated.    06/26/23: Scans with largely stable disease, although growing abdominal wall tumor.    4/26-5/11: Admitted with abdominal pain, hematochezia. She underwent iron  infusion and EGD, discharged on budesonide . Syncope felt most consistent with POTS.    07/31/23: Nirogacestat  up to 100mg  BID. Budesonide  for GI inflammation. Cards for POTS eval.     09/01/23: Recent abdominal pain led to RUQ US  - mild bil dil but no other findings. Bili not elevated. Cards does not think she meets criteria for POTS. Scans in 1 month.    09/29/23: Scans overall stable; possible enlargement of lesion posterior to right forth rib. Continues on nirogacestat  100 mg BID.    11/03/23: Admitted 10/13/2023-10/22/2023 with GI bleed; superficial ulceration at ileoanal anastomosis. Tolerated IV iron  with premed regimen. Nirogacestat  held due to association with GI bleeding. Suspected secondary adrenal insufficiency (+/- POTS / functional) contributing to syncopal episodes/orthostatic intolerance.    Desmoid fibromatosis  Multiple locations, primarily in mesentery, some extremities. Stable disease with nirogacestat , which may be contributing to diarrhea, limiting therapy. Symptom control complicated by  fear of bowel obstructions.  - Will continue to hold nirogacestat  until diarrhea is under better control (< 10 bowel movements / day)  - facilitate follow up with Eleanor Bari, FNP to discuss local resources and medical resume  - Delay MRI for RUE 1 month due to IV Fe    Diarrhea  - encouraged to move up [facility] GI follow up  Also considering  antegrade double balloon endoscopy to evaluate for bleeding source and side-viewing upper endoscopy exam as recommended for [name] surveillance  - encouraged to increase imodium  up to 1 pill 3x/day  - will assist with local standing IVF orders     Chronic multifactorial pain, including Cancer-related pain; visceral hyperalgesia  Recent imaging and laboratory studies reassuring against significant increase in lesions. Stress may contribute to pain exacerbation.  - Managed by Pain medicine specialist; last visit 10/23/23  - Consider stress management techniques to help alleviate pain.     Recurrent gastrointestinal bleeding  Iron  deficiency anemia  Slight worsening of anemia with continued symptoms.  - Seen by A&I on recent admission; tolerated IV Fe - can receive outpatient with appropriate premeds  - Consider tranexamic acid  if bleeding persists.  - Ensure adequate dietary protein intake.  - Monitor hemoglobin and iron  panel     Mental Health & Hot flashes  - continue Lexapro   - connect with Dr. Susi    Patient seen and case discussed with Dr. Lang Favorite.    Caylen Yardley, M.D.  Pediatric Hematology Oncology Fellow, PGY-7        REASON FOR CONSULTATION:   [name] [name] is a 23 y.o. female who is seen in consultation at the request of Pcp, None Per Patient for evaluation of her desmoid fibromatosis    HISTORY OF PRESENT ILLNESS:      Right arm desmoid is the most bothersome - significant arm cramping, deep aching pain, lasts for ~30 min after cramping starts. Has noticed right hand shaking frequently. 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     Left chest lesion used to be quite uncomfortable, caused swelling in her arm and discomfort. Improved after cryoablation.     Right shoulder blade hurts when she leans back against it, fairly painful. Right chest wall lesion has some associated pain, improving since cryo.     First diagnosed at 23 years old - seen in [place] at first, had numerous cysts. First major issues were two paraspinal lesions, referred to [facility] for biopsy and ultimately resection.  Soon diagnosed with [name], seen by medical genetics early on.  Guided by Dr. Mliss Combs, referred out to proceduralists as needed.  2011 - had a growing lesion on her back as well as bilateral neck lesions, resected at [name], found to be desmoid  02/2012 - surgery for thoracic spine desmoid. Maybe another flank lesion removed  2017 - wrist lesion treated by VIR with bupivicaine, kenalog   08/2016 - endoscopy with adenomatous polyp of the colon, non-dysplastic  02/2017 - subcutaneous lesion resection by plastic surgery (postauricular and RUE, RLE) - path revealed epidermal inclusion cysts, osteomas  05/2018: cryotherapy of left anterior chest wall lesion  05/2018: started sulindac  but struggled with adherence due to GI side effects  08/2018: cryo #2 to chest wall lesion  12/10/2018: started sorafenib  due to progression in left paraspinal lesion, ?posterior chest wall lesion  02/2019: discontinued sorafenib  for several reasons: fatigue, dizziness, nausea, hair loss, as well as COVID infection in late November. Derm consulted for  rash / hair loss, started on antihistamines, topical steroids. Trialed lower dose but ultimately stopped again 05/2019 due to toxicity.  06/2019: follow up with surgical oncology - plans for observation rather than repeat surgery  07/2019: cryoablation of anterior chest wall desmoid  07/2019: excision of subcutaneous LE mass and L neck masses, clinically appeared consistent with cysts  08/2019: cryoablation of R posterior chest wall, left paraspinal desmoid     Interval History:    Had a quick tune up hospitalization x 10 days for GI bleed. Received local treatment for superficial ulceration at ileoanal anastomosis, tolerated IV Fe with premeds, and diagnosed with suspected secondary adrenal insufficiency contributing to orthostatic intolerance. Nirogacestat  was held due to association with GI bleeding, and she was instructed to not resume it until seeing Dr. Loris.    Since discharge, symptoms of GI bleeding and syncopal episodes initially improved, but have worsened in the past week. Currently about 20-25 Bms/day and at least 5 syncopal episodes in the past week. Recent Hb at PCP (Aug 19th) was 10.2. She remains hesitant to fully utilize imodium , but she gained some confidence after a trial while inpatient. She is currently taking about 1 pill daily. After taking 1 pill BID, she actually went 12 hours without a bowel movement.    She met with [facility] for a second opinion lately. She is exploring the possibility of an antegrade double balloon endoscopy to evaluate for bleeding source and side-viewing upper endoscopy exam as recommended for [name] surveillance.    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 Pelvic floor pain has been most bothersome for long clinical days. Her therapy dog, Layla, has been a helpful addition. Layla is being trained to respond to her syncopal episodes. She is interested in partnering with local infusion clinic for increased frequency IV fluids and lab checks.    REVIEW OF SYSTEMS:  A comprehensive review of 12 systems was negative except for pertinent positives noted in HPI.    Past Medical History, Surgical History, Family History were reviewed personally. Any changes were updated as above.    Social History:  Attended [employer] in [place], has been interrupted by health issues;  Wants to be a nurse in pediatric hematology/oncology   Mother [name] is a strong advocate, often present at visits    Pregnancy status: premenopausal, interested in future children    ALLERGIES/MEDICATIONS:  Reviewed in Epic    PHYSICAL EXAM:   There were no vitals taken for this visit.  ECOG 1  Gen - well appearing, mother accompanying her   Eyes - conjunctivae clear, PERRL  ENT -no scleral icterus, moist mucous membranes  Lymph - no cervical or supraclavicular lymphadenopathy appreciated  Resp - clear to auscultation bilaterally, no wheezes, crackles or rales  CV - normal rate, regular rhythm, no lower extremity edema noted  Skin - flushing of skin noted on chest  Neuro - AOx3, EOM intact, no facial droop, speech fluent and coherent, moves all extremities without asymmetry  Psych - affect anxious  MSK - no joint tenderness or swelling    PATHOLOGY:   Pathology was personally reviewed as described in the HPI, detailed in Epic.     LABS:  Reviewed in Epic    RADIOLOGY:  Imaging was personally viewed and interpreted as summarized in the history (see above).

## 2023-11-03 ENCOUNTER — Other Ambulatory Visit: Admit: 2023-11-03 | Discharge: 2023-11-04 | Payer: BLUE CROSS/BLUE SHIELD

## 2023-11-03 ENCOUNTER — Inpatient Hospital Stay: Admit: 2023-11-03 | Discharge: 2023-11-04 | Payer: BLUE CROSS/BLUE SHIELD

## 2023-11-03 ENCOUNTER — Ambulatory Visit: Admit: 2023-11-03 | Discharge: 2023-11-04 | Payer: BLUE CROSS/BLUE SHIELD

## 2023-11-03 DIAGNOSIS — K638219 Small intestinal bacterial overgrowth (SIBO): Principal | ICD-10-CM

## 2023-11-03 DIAGNOSIS — Z79899 Other long term (current) drug therapy: Principal | ICD-10-CM

## 2023-11-03 DIAGNOSIS — D485 Neoplasm of uncertain behavior of skin: Principal | ICD-10-CM

## 2023-11-03 DIAGNOSIS — G43E09 Chronic migraine with aura without status migrainosus, not intractable: Principal | ICD-10-CM

## 2023-11-03 DIAGNOSIS — Z9049 Acquired absence of other specified parts of digestive tract: Principal | ICD-10-CM

## 2023-11-03 DIAGNOSIS — F32A Anxiety and depression: Principal | ICD-10-CM

## 2023-11-03 DIAGNOSIS — F419 Anxiety disorder, unspecified: Principal | ICD-10-CM

## 2023-11-03 DIAGNOSIS — D1391 FAP (familial adenomatous polyposis): Principal | ICD-10-CM

## 2023-11-03 DIAGNOSIS — D48119 Desmoid tumor: Principal | ICD-10-CM

## 2023-11-03 DIAGNOSIS — G893 Neoplasm related pain (acute) (chronic): Principal | ICD-10-CM

## 2023-11-03 DIAGNOSIS — K922 Gastrointestinal hemorrhage, unspecified: Principal | ICD-10-CM

## 2023-11-03 DIAGNOSIS — K56609 Unspecified intestinal obstruction, unspecified as to partial versus complete obstruction: Principal | ICD-10-CM

## 2023-11-03 DIAGNOSIS — E43 Unspecified severe protein-calorie malnutrition: Principal | ICD-10-CM

## 2023-11-03 LAB — IRON & TIBC
IRON SATURATION: 10 % — ABNORMAL LOW (ref 20–55)
IRON: 25 ug/dL — ABNORMAL LOW (ref 50–170)
TOTAL IRON BINDING CAPACITY: 247 ug/dL — ABNORMAL LOW (ref 250–425)

## 2023-11-03 LAB — CBC W/ AUTO DIFF
BASOPHILS ABSOLUTE COUNT: 0 10*9/L (ref 0.0–0.1)
BASOPHILS RELATIVE PERCENT: 0.3 %
EOSINOPHILS ABSOLUTE COUNT: 0.1 10*9/L (ref 0.0–0.5)
EOSINOPHILS RELATIVE PERCENT: 1.7 %
HEMATOCRIT: 28.4 % — ABNORMAL LOW (ref 34.0–44.0)
HEMOGLOBIN: 9.3 g/dL — ABNORMAL LOW (ref 11.3–14.9)
LYMPHOCYTES ABSOLUTE COUNT: 2.2 10*9/L (ref 1.1–3.6)
LYMPHOCYTES RELATIVE PERCENT: 27.6 %
MEAN CORPUSCULAR HEMOGLOBIN CONC: 32.8 g/dL (ref 32.0–36.0)
MEAN CORPUSCULAR HEMOGLOBIN: 27.3 pg (ref 25.9–32.4)
MEAN CORPUSCULAR VOLUME: 83.1 fL (ref 77.6–95.7)
MEAN PLATELET VOLUME: 9.5 fL (ref 6.8–10.7)
MONOCYTES ABSOLUTE COUNT: 0.8 10*9/L (ref 0.3–0.8)
MONOCYTES RELATIVE PERCENT: 9.6 %
NEUTROPHILS ABSOLUTE COUNT: 4.9 10*9/L (ref 1.8–7.8)
NEUTROPHILS RELATIVE PERCENT: 60.8 %
PLATELET COUNT: 292 10*9/L (ref 150–450)
RED BLOOD CELL COUNT: 3.41 10*12/L — ABNORMAL LOW (ref 3.95–5.13)
RED CELL DISTRIBUTION WIDTH: 21 % — ABNORMAL HIGH (ref 12.2–15.2)
WBC ADJUSTED: 8 10*9/L (ref 3.6–11.2)

## 2023-11-03 LAB — COMPREHENSIVE METABOLIC PANEL
ALBUMIN: 3.3 g/dL — ABNORMAL LOW (ref 3.4–5.0)
ALKALINE PHOSPHATASE: 48 U/L (ref 46–116)
ALT (SGPT): <7 U/L — ABNORMAL LOW (ref 10–49)
ANION GAP: 8 mmol/L (ref 5–14)
AST (SGOT): 10 U/L (ref <=34)
BILIRUBIN TOTAL: 0.2 mg/dL — ABNORMAL LOW (ref 0.3–1.2)
BLOOD UREA NITROGEN: 8 mg/dL — ABNORMAL LOW (ref 9–23)
BUN / CREAT RATIO: 15
CALCIUM: 9.2 mg/dL (ref 8.7–10.4)
CHLORIDE: 104 mmol/L (ref 98–107)
CO2: 29 mmol/L (ref 20.0–31.0)
CREATININE: 0.53 mg/dL — ABNORMAL LOW (ref 0.55–1.02)
EGFR CKD-EPI (2021) FEMALE: >90 mL/min/1.73m2 (ref >=60)
GLUCOSE RANDOM: 98 mg/dL (ref 70–179)
POTASSIUM: 3.9 mmol/L (ref 3.4–4.8)
PROTEIN TOTAL: 6.1 g/dL (ref 5.7–8.2)
SODIUM: 141 mmol/L (ref 135–145)

## 2023-11-03 LAB — MAGNESIUM: MAGNESIUM: 2 mg/dL (ref 1.6–2.6)

## 2023-11-03 LAB — PHOSPHORUS: PHOSPHORUS: 3.9 mg/dL (ref 2.4–5.1)

## 2023-11-03 LAB — T4, FREE: FREE T4: 1.04 ng/dL (ref 0.89–1.76)

## 2023-11-03 LAB — TSH: THYROID STIMULATING HORMONE: 0.66 u[IU]/mL (ref 0.550–4.780)

## 2023-11-03 LAB — FERRITIN: FERRITIN: 73.8 ng/mL (ref 7.3–270.7)

## 2023-11-03 MED ADMIN — sodium chloride (NS) 0.9 % infusion: 1000 mL/h | INTRAVENOUS | @ 19:00:00 | Stop: 2023-11-03

## 2023-11-03 MED ADMIN — heparin, porcine (PF) 100 unit/mL injection 500 Units: 500 [IU] | INTRAVENOUS | @ 20:00:00 | Stop: 2023-11-04

## 2023-11-03 NOTE — Unmapped (Signed)
 Patient arrived via wheelchair to chair 60 for IV hydration.  Treatment plan & Labs reviewed. Parameters met for treatment today.   Port  accessed prior to arrival;. + blood return and Saline locked,.    Patient tolerated NS bolus well.    Doria Borer, NP notified that patient reports that she has been passing out frequently and the last time was for 45 seconds on 11/02/23 and that she lost consciousness 1.5 weeks ago.    Port  [place] accessed,, + blood return, and Heparin  locked,  AVS declined.  Discharged via wheelchair to home.

## 2023-11-03 NOTE — Unmapped (Signed)
 RED ZONE Means: RED ZONE: Take action now!     You need to be seen right away  Symptoms are at a severe level of discomfort    Call 911 or go to your nearest  Hospital for help     - Bleeding that will not stop    - Hard to breathe    - New seizure - Chest pain  - Fall or passing out  -Thoughts of hurting    yourself or others      Call 911 if you are going into the RED ZONE                  YELLOW ZONE Means:     Please call with any new or worsening symptom(s), even if not on this list.  Call [phone]  After hours, weekends, and holidays - you will reach a long recording with specific instructions, If not in an emergency such as above, please listen closely all the way to the end and choose the option that relates to your need.   You can be seen by a provider the same day through our Same Day Acute Care for Patients with Cancer program.      YELLOW ZONE: Take action today     Symptoms are new or worsening  You are not within your goal range for:    - Pain    - Shortness of breath    - Bleeding (nose, urine, stool, wound)    - Feeling sick to your stomach and throwing up    - Mouth sores/pain in your mouth or throat    - Hard stool or very loose stools (increase in       ostomy output)    - No urine for 12 hours    - Feeding tube or other catheter/tube issue    - Redness or pain at previous IV or port/catheter site    - Depressed or anxiety   - Swelling (leg, arm, abdomen,     face, neck)  - Skin rash or skin changes  - Wound issues (redness, drainage,    re-opened)  - Confusion  - Vision changes  - Fever >100.4 F or chills  - Worsening cough with mucus that is    green, yellow, or bloody  - Pain or burning when going to the    bathroom  - Home Infusion Pump Issue- call    [phone]         Call your healthcare provider if you are going into the YELLOW ZONE     GREEN ZONE Means:  Your symptoms are under controls  Continue to take your medicine as ordered  Keep all visits to the provider GREEN ZONE: You are in control  No increase or worsening symptoms  Able to take your medicine  Able to drink and eat    - DO NOT use MyChart messages to report red or yellow symptoms. Allow up to 3    business days for a reply.  -MyChart is for non-urgent medication refills, scheduling requests, or other general questions.         [id number] Rev. 09/06/2021  Approved by Oncology Patient Education Committee     Lab on 11/03/2023   Component Date Value Ref Range Status    Phosphorus 11/03/2023 3.9  2.4 - 5.1 mg/dL Final    Magnesium  11/03/2023 2.0  1.6 - 2.6 mg/dL Final    Free T4 91/73/7974 1.04  0.89 - 1.76 ng/dL  Final    TSH 11/03/2023 0.660  0.550 - 4.780 uIU/mL Final    Sodium 11/03/2023 141  135 - 145 mmol/L Final    Potassium 11/03/2023 3.9  3.4 - 4.8 mmol/L Final    Chloride 11/03/2023 104  98 - 107 mmol/L Final    CO2 11/03/2023 29.0  20.0 - 31.0 mmol/L Final    Anion Gap 11/03/2023 8  5 - 14 mmol/L Final    BUN 11/03/2023 8 (L)  9 - 23 mg/dL Final    Creatinine 91/73/7974 0.53 (L)  0.55 - 1.02 mg/dL Final    BUN/Creatinine Ratio 11/03/2023 15   Final    eGFR CKD-EPI (2021) Female 11/03/2023 >90  >=60 mL/min/1.42m2 Final    eGFR calculated with CKD-EPI 2021 equation in accordance with [employer] and [employer] of Nephrology Task Force recommendations.    Glucose 11/03/2023 98  70 - 179 mg/dL Final    Calcium 91/73/7974 9.2  8.7 - 10.4 mg/dL Final    Albumin 91/73/7974 3.3 (L)  3.4 - 5.0 g/dL Final    Total Protein 11/03/2023 6.1  5.7 - 8.2 g/dL Final    Total Bilirubin 11/03/2023 0.2 (L)  0.3 - 1.2 mg/dL Final    AST 91/73/7974 10  <=34 U/L Final    ALT 11/03/2023 <7 (L)  10 - 49 U/L Final    Alkaline Phosphatase 11/03/2023 48  46 - 116 U/L Final    WBC 11/03/2023 8.0  3.6 - 11.2 10*9/L Final    RBC 11/03/2023 3.41 (L)  3.95 - 5.13 10*12/L Final    HGB 11/03/2023 9.3 (L)  11.3 - 14.9 g/dL Final    HCT 91/73/7974 28.4 (L)  34.0 - 44.0 % Final    MCV 11/03/2023 83.1  77.6 - 95.7 fL Final    MCH 11/03/2023 27.3  25.9 - 32.4 pg Final    MCHC 11/03/2023 32.8  32.0 - 36.0 g/dL Final    RDW 91/73/7974 21.0 (H)  12.2 - 15.2 % Final    MPV 11/03/2023 9.5  6.8 - 10.7 fL Final    Platelet 11/03/2023 292  150 - 450 10*9/L Final    Neutrophils % 11/03/2023 60.8  % Final    Lymphocytes % 11/03/2023 27.6  % Final    Monocytes % 11/03/2023 9.6  % Final    Eosinophils % 11/03/2023 1.7  % Final    Basophils % 11/03/2023 0.3  % Final    Absolute Neutrophils 11/03/2023 4.9  1.8 - 7.8 10*9/L Final    Absolute Lymphocytes 11/03/2023 2.2  1.1 - 3.6 10*9/L Final    Absolute Monocytes 11/03/2023 0.8  0.3 - 0.8 10*9/L Final    Absolute Eosinophils 11/03/2023 0.1  0.0 - 0.5 10*9/L Final    Absolute Basophils 11/03/2023 0.0  0.0 - 0.1 10*9/L Final    Anisocytosis 11/03/2023 Moderate (A)  Not Present Final    Ferritin 11/03/2023 73.8  7.3 - 270.7 ng/mL Final

## 2023-11-04 ENCOUNTER — Telehealth: Payer: Self-pay | Admitting: *Deleted

## 2023-11-04 ENCOUNTER — Encounter: Payer: Self-pay | Admitting: *Deleted

## 2023-11-04 DIAGNOSIS — D48119 Desmoid tumor: Principal | ICD-10-CM

## 2023-11-05 ENCOUNTER — Telehealth: Payer: Self-pay | Admitting: *Deleted

## 2023-11-05 NOTE — Patient Instructions (Addendum)
 Visit Information  Thank you for taking time to visit with me today. Please don't hesitate to contact me if I can be of assistance to you before our next scheduled telephone appointment.  Following are the goals we discussed today:   Goals Addressed             This Visit's Progress    COMPLETED: VBCI Transitions of Care (TOC) Care Plan       Problems:  Recent Hospitalization for treatment of GI Bleed No Hospital Follow Up Provider appointment pt, mother declined Pt has Gardner's syndrome and is followed by oncology at [facility]. Spoke with patient and her mother who reports pt is feeling better although she is still having GI bleeding through her J pouch (pt does not have a colon), pt has watery stools (chronic) through J pouch, using immodium with some relief obtained, GI bleeding has been reported, pt is having endoscopy at [facility] 10/28/23, had labs today at primary care provider,  patient's mother would like a standing order locally for pt to have labwork and IV fluids if needed, would like primary care provider contacted to see if this is an option, pt has had some falls due to dizziness. Pt is active and in nursing school, has good support from her parents 11/05/23- spoke with patient's mother (she is not with pt and not at home) who reports pt did see oncologist at [facility] on 11/03/23, pt continues to have GI bleeding, MD aware and pt is to follow up with GI, pt is in nursing school, is very busy, so is patient's mother, declined further follow up, has RN CM contact #, states they will call if any questions.  Goal:  Over the next 30 days, the patient will not experience hospital readmission  Interventions: GI Bleed Evaluation of current treatment plan related to GI bleed, self-management and patient's adherence to plan as established by provider. Discussed plans with patient for ongoing care management follow up and provided patient with direct contact information for care management  team Evaluation of current treatment plan related to GI bleed and patient's adherence to plan as established by provider Provided education to patient re: signs/ symptoms GI bleed, reportable signs /symptoms Reviewed medications with patient and discussed importance of taking as prescribed Discussed plans with patient for ongoing care management follow up and provided patient with direct contact information for care management team Reinforced safety precautions and importance of not ambulating if dizzy Reinforced signs /symptoms of dehydration, importance of staying well hydrated Reinforced signs / symptoms of infection Reinforced signs/ symptoms of GI bleeding, importance of reporting to provider Unable to get in touch with oncologist at [facility] Dr. Lang Favorite, lengthy on hold times and unable to speak with anyone, provided / reinforced information to patient's mother for oncologist at [facility] to fax orders to primary care provider office for lab work @ [phone], and send order electronically in epic, patient in close contact with oncologist and will provide information to oncologist,  reviewed with patient's mother that pt cannot receive IV fluids at [facility] due to pt would need to see one of their providers (this is not an option per patient's mother), pt is currently going to local ED or [facility] for IV fluids when necessary and this is time consuming Reviewed plan of care with patient's mother, due to patient and mother being very busy, difficult to get in touch with, declined further outreach, has RN CM contact #, will call for any questions  Patient  Self Care Activities:  Attend all scheduled provider appointments Attend church or other social activities Call pharmacy for medication refills 3-7 days in advance of running out of medications Call provider office for new concerns or questions  Notify RN Care Manager of TOC call rescheduling needs Participate in Transition of Care Program/Attend  TOC scheduled calls Perform all self care activities independently  Perform IADL's (shopping, preparing meals, housekeeping, managing finances) independently Take medications as prescribed   Drink adequate fluids Do not ambulate if you are dizzy Report any GI bleeding to your GI provider at [facility] Please have oncologist at [facility] fax orders for lab work to [facility] [employer] @ [phone] and send electronic order through epic  Plan:  The patient has been provided with contact information for the care management team and has been advised to call with any health related questions or concerns.  Case closure, please call RN Care Manager if you have any questions         Our next appointment is no further scheduled appointments.    Please call the care guide team at [phone] if you need to cancel or reschedule your appointment.   If you are experiencing a Mental Health or Behavioral Health Crisis or need someone to talk to, please call the Suicide and Crisis Lifeline: 988 call the [place]  National Suicide Prevention Lifeline: (534) 545-4803 or TTY: 236 114 4453 TTY (816) 022-5731) to talk to a trained counselor call 1-800-273-TALK (toll free, 24 hour hotline) go to [facility] [address], [place] [phone]) call 911   Patient verbalizes understanding of instructions and care plan provided today and agrees to view in MyChart. Active MyChart status and patient understanding of how to access instructions and care plan via MyChart confirmed with patient.     No further follow up required: case closure  [facility] oncologist to fax order for labwork to [facility] @ [phone] and send electronic order through Epic  Mliss Creed [facility], BSN RN Care Manager/ Transition of Care [place]/ [facility] [phone]

## 2023-11-05 NOTE — Patient Outreach (Addendum)
 Transition of Care week 2  Visit Note  11/05/2023  Name: Lisa Donovan: [id number]          DOB: 17-Aug-2000  Situation: Patient enrolled in [facility] 30-day program. Visit completed with patient's mother by telephone.   Background:     Past Medical History:  Diagnosis Date   Anemia    Colon polyps    Dx at age 23, has colonoscopy yearly   [name]    [name]    Dx at age 24   GERD (gastroesophageal reflux disease)    H/O colonoscopy    Pt has has them yearly   History of endoscopy    Ileus (HCC)     Assessment: Patient Reported Symptoms: Cognitive Cognitive Status:  (unable to complete assessments, did not speak with pt, spoke with patient's mother Lisa Donovan)      Neurological Neurological Review of Symptoms: Not assessed    HEENT HEENT Symptoms Reported: Not assessed      Cardiovascular Cardiovascular Symptoms Reported: Not assessed    Respiratory Respiratory Symptoms Reported: Not assesed    Endocrine Endocrine Symptoms Reported: Not assessed    Gastrointestinal Gastrointestinal Symptoms Reported: Not assessed Other Gastrointestinal Symptoms: J pouch- pt does not have a colon Additional Gastrointestinal Details: per patient's mother, pt continues to have GI bleeding, maroon color from J pouch, physician at [facility] aware, pt is to follow up with GI, pt saw oncologist at [facility] 11/03/23 Gastrointestinal Management Strategies: Adequate rest, Medication therapy Gastrointestinal Self-Management Outcome: 3 (uncertain) Gastrointestinal Comment: reinforced signs/ symptoms of GI bleeding, reportable signs/ symptoms    Genitourinary Genitourinary Symptoms Reported: Not assessed    Integumentary Integumentary Symptoms Reported: Not assessed    Musculoskeletal Musculoskelatal Symptoms Reviewed: Not assessed        Psychosocial Psychosocial Symptoms Reported: Not assessed         There were no vitals filed for this visit.  Medications Reviewed  Today     Reviewed by Aura Mliss LABOR, RN (Registered Nurse) on 11/05/23 at 1152  Med List Status: <None>   Medication Order Taking? Sig Documenting Provider Last Dose Status Informant  acetaminophen  (TYLENOL ) 500 MG tablet [id number]  Take 1,000 mg by mouth every 6 (six) hours as needed for moderate pain. [provider]  Active Mother, Pharmacy Records  ammonium lactate (LAC-HYDRIN) 12 % lotion [id number]  Apply 1 Application topically in the morning and at bedtime. [provider]  Active Mother, Pharmacy Records  baclofen (LIORESAL) 10 MG tablet [id number]  Take 1 tablet by mouth 2 (two) times daily as needed (muscle pains). [provider]  Active Mother, Pharmacy Records  buprenorphine [facility]) 20 MCG/HR [place] [id number]  Place 1 patch onto the skin once a week. [provider]  Active   escitalopram (LEXAPRO) 5 MG tablet [id number]  Take 5 mg by mouth daily. [provider]  Active   famotidine  (PEPCID ) 20 MG tablet [id number]  Take 1 tablet (20 mg total) by mouth 2 (two) times daily. Theadore Ozell HERO, MD  Active Mother, Pharmacy Records  fluticasone [facility]) 50 MCG/ACT nasal spray [id number]  1 spray. [provider]  Active   hydrocortisone  (ANUSOL -HC) 2.5 % rectal cream [id number]  Place rectally 3 (three) times daily. Sherrill Cable Cable, [place]  Active Mother, Pharmacy Records  lidocaine  4 % [id number]  Place 2 patches onto the skin as needed (pain). [provider]  Active Mother, Pharmacy Records  LORazepam  (ATIVAN ) 1 MG tablet [id number]  Take 1 mg  by mouth 2 (two) times daily as needed. [provider]  Active   mirtazapine  (REMERON ) 15 MG tablet [id number]  Take 15 mg by mouth at bedtime.  Patient not taking: Reported on 10/27/2023   [provider]  Active Mother, Pharmacy Records           Med Note IDAMAE ALFREIDA CROME   Fri Dec 12, 2022  7:00 PM) Medication currently on hold while taking Valium .  naloxegol  oxalate (MOVANTIK) 25 MG TABS tablet [id number]  Take 25 mg by mouth daily. Taking only as needed [provider]  Active Mother, Pharmacy Records           Med Note IDAMAE ALFREIDA CROME   Fri Dec 12, 2022  7:03 PM) Need to follow up with doctor about continuing this medication.  naloxone (NARCAN) nasal spray 4 mg/0.1 mL [id number]  Place 1 spray into the nose once. [provider]  Active Mother, Pharmacy Records           Med Note IDAMAE ALFREIDA CROME   Fri Dec 12, 2022  7:02 PM) Patient has not had to use this medication.  nirogacestat hydrobromide (OGSIVEO) 50 MG tablet [id number]  Take 150 mg by mouth 2 (two) times daily. [provider]  Active Mother, Pharmacy Records  ondansetron  (ZOFRAN -ODT) 8 MG disintegrating tablet [id number]  Take 1 tablet (8 mg total) by mouth every 8 (eight) hours as needed for nausea or vomiting. Job Lukes, [place]  Active Mother, Pharmacy Records           Med Note IDAMAE ALFREIDA CROME   Fri Dec 12, 2022  6:36 PM) Was given by [facility] when admitted.  oxyCODONE  (OXY IR/ROXICODONE ) 5 MG immediate release tablet [id number]  Take 10-15 mg by mouth 2 (two) times daily as needed for moderate pain. [provider]  Active Mother, Pharmacy Records  pantoprazole  (PROTONIX ) 40 MG tablet [id number]  Take 40 mg by mouth daily. [provider]  Active Mother, Pharmacy Records  promethazine  (PHENERGAN ) 12.5 MG tablet [id number]  Take 12.5 mg by mouth every 6 (six) hours as needed for nausea or vomiting. [provider]  Active Mother, Pharmacy Records  scopolamine  (TRANSDERM-SCOP) 1 MG/3DAYS [id number]  Place 1 patch (1.5 mg total) onto the skin every 3 (three) days. Job Lukes, PA  Active   Zinc  Oxide (DESITIN [place]) [id number]  Apply 1 application. topically 2 (two) times daily as needed (irriation). [provider]  Active Mother, Pharmacy Records            Goals Addressed             This Visit's  Progress    COMPLETED: VBCI Transitions of Care (TOC) Care Plan       Problems:  Recent Hospitalization for treatment of GI Bleed No Hospital Follow Up Provider appointment pt, mother declined Pt has Gardner's syndrome and is followed by oncology at [facility]. Spoke with patient and her mother who reports pt is feeling better although she is still having GI bleeding through her J pouch (pt does not have a colon), pt has watery stools (chronic) through J pouch, using immodium with some relief obtained, GI bleeding has been reported, pt is having endoscopy at [facility] 10/28/23, had labs today at primary care provider,  patient's mother would like a standing order locally for pt to have labwork and IV fluids if needed, would like primary care provider contacted to see if this is an option, pt has had some falls  due to dizziness. Pt is active and in nursing school, has good support from her parents 11/05/23- spoke with patient's mother (she is not with pt and not at home) who reports pt did see oncologist at [facility] on 11/03/23, pt continues to have GI bleeding, MD aware and pt is to follow up with GI, pt is in nursing school, is very busy, so is patient's mother, declined further follow up, has RN CM contact #, states they will call if any questions.  Goal:  Over the next 30 days, the patient will not experience hospital readmission  Interventions: GI Bleed Evaluation of current treatment plan related to GI bleed, self-management and patient's adherence to plan as established by provider. Discussed plans with patient for ongoing care management follow up and provided patient with direct contact information for care management team Evaluation of current treatment plan related to GI bleed and patient's adherence to plan as established by provider Provided education to patient re: signs/ symptoms GI bleed, reportable signs /symptoms Reviewed medications with patient and discussed importance of taking as  prescribed Discussed plans with patient for ongoing care management follow up and provided patient with direct contact information for care management team Reinforced safety precautions and importance of not ambulating if dizzy Reinforced signs /symptoms of dehydration, importance of staying well hydrated Reinforced signs / symptoms of infection Reinforced signs/ symptoms of GI bleeding, importance of reporting to provider Unable to get in touch with oncologist at [facility] Dr. Lang Favorite, lengthy on hold times and unable to speak with anyone, provided / reinforced information to patient's mother for oncologist at [facility] to fax orders to primary care provider office for lab work @ [phone], and send order electronically in epic, patient in close contact with oncologist and will provide information to oncologist,  reviewed with patient's mother that pt cannot receive IV fluids at [facility] due to pt would need to see one of their providers (this is not an option per patient's mother), pt is currently going to local ED or [facility] for IV fluids when necessary and this is time consuming Reviewed plan of care with patient's mother, due to patient and mother being very busy, difficult to get in touch with, declined further outreach, has RN CM contact #, will call for any questions  Patient Self Care Activities:  Attend all scheduled provider appointments Attend church or other social activities Call pharmacy for medication refills 3-7 days in advance of running out of medications Call provider office for new concerns or questions  Notify RN Care Manager of TOC call rescheduling needs Participate in Transition of Care Program/Attend TOC scheduled calls Perform all self care activities independently  Perform IADL's (shopping, preparing meals, housekeeping, managing finances) independently Take medications as prescribed   Drink adequate fluids Do not ambulate if you are dizzy Report any GI bleeding to your  GI provider at [facility] Please have oncologist at [facility] fax orders for lab work to [employer] @ [phone] and send electronic order through epic  Plan:  The patient has been provided with contact information for the care management team and has been advised to call with any health related questions or concerns.  Case closure, please call RN Care Manager if you have any questions        Recommendation:   PCP Follow-up Specialty provider follow-up GI, oncology  Follow Up Plan:   Closing From:  Transitions of Care Program  Mliss Creed [facility], BSN RN Care Manager/ Transition of Care  [facility] [phone]

## 2023-11-10 DIAGNOSIS — D48119 Desmoid tumor: Principal | ICD-10-CM

## 2023-11-10 NOTE — Unmapped (Signed)
 [facility] Delivery Pharm[facility] Refill Coordination Note    [name] [name], DOB: 27-Aug-2000  Phone: [phone] (home) [phone] (work)      All above HIPAA information was verified with patient.         11/07/2023    10:52 AM   Specialty Rx Medication Refill Questionnaire   Which Medications would you like refilled and shipped? I have 3 weeks on hand. I was hospitalized 10 days in August and they held the medication through the hospital stay and until I just met with oncology. I will be starting back gradually, so please hold the shipment for now. Thank you   If medication refills are not needed at this time, please indicate the reason below. I do not need refills at this time, please contact me in 2 weeks.   Please list all current allergies: On file   Have you missed any doses in the last 30 days? Yes   If Yes, how many doses have you missed ? All   Have you had any changes to your medication(s) since your last refill? Yes   Please list your medication(s) changes below. Added hydrocortisone  10mg  1xday & loperamide  2 mg capsule up to 4xs day   How much of each medication do you have remaining at home? (eg. number of tablets, injections, etc.) 3 weeks (14 tabs per weekly packet)   Have you experienced any side effects in the last 30 days? No   Please enter the full address (street address, city, state, zip code) where you would like your medication(s) to be delivered to. [address], [place], [place] [address]   Please specify on which day you would like your medication(s) to arrive. Note: if you need your medication(s) within 3 days, please call the pharmacy to schedule your order at [phone]  11/27/2023   Has your insurance changed since your last refill? No   Would you like a pharmacist to call you to discuss your medication(s)? No   Do you require a signature for your package? (Note: if we are billing Medicare Part B or your order contains a controlled substance, we will require a signature) No   I have been provided my out of pocket cost for my medication and approve the pharmacy to charge the amount to my credit card on file. No   Additional Information Confirm         Completed refill call assessment today to schedule patient's medication shipment from the [facility] [phone]).  All relevant notes have been reviewed.       Confirmed patient received a Conservation officer, historic buildings and a Surveyor, mining with first shipment. The patient will receive a drug information handout for each medication shipped and additional FDA Medication Guides as required.         REFERRAL TO PHARMACIST     Referral to the pharmacist: Not needed      [facility]     Shipping address confirmed in Epic.     Delivery Scheduled: Patient declined refill at this time due to medication was on hold.     Medication will be delivered via n/a to the n/a address in Epic [place].    [name] [name]   [place] [facility] Specialty Technician

## 2023-11-12 DIAGNOSIS — G894 Chronic pain syndrome: Principal | ICD-10-CM

## 2023-11-12 DIAGNOSIS — G893 Neoplasm related pain (acute) (chronic): Principal | ICD-10-CM

## 2023-11-12 DIAGNOSIS — R1084 Generalized abdominal pain: Principal | ICD-10-CM

## 2023-11-12 MED ORDER — OXYCODONE 10 MG TABLET,ORAL ONLY (NOT FEEDING TUBES)
ORAL_TABLET | ORAL | 0 refills | 30.00000 days
Start: 2023-11-12 — End: 2023-12-12

## 2023-11-12 NOTE — Unmapped (Signed)
 Patient left VM on nurse line stating prescription she received on 8/15 for Oxycodone  10 mg tabs. #294. Pharmacy was able to fill script for #180 tabs. She is almost out of med and is requesting a new prescription.    Per PMP patient last fill med on 8/16 for #180. I spoke with patient she currently has 28 tablets remaining. She states she is actually taking 2 tablets 4 times per day. Patient is requesting refill. Please consider refill if appropriate

## 2023-11-13 DIAGNOSIS — D48119 Desmoid tumor: Principal | ICD-10-CM

## 2023-11-13 MED ORDER — OXYCODONE 20 MG TABLET
ORAL_TABLET | Freq: Four times a day (QID) | ORAL | 0 refills | 30.00000 days | Status: CP | PRN
Start: 2023-11-13 — End: ?

## 2023-11-13 MED ORDER — OXYCODONE 10 MG TABLET,ORAL ONLY (NOT FEEDING TUBES)
ORAL_TABLET | Freq: Four times a day (QID) | ORAL | 0 refills | 30.00000 days | Status: CP | PRN
Start: 2023-11-13 — End: 2023-11-13

## 2023-11-13 NOTE — Unmapped (Signed)
 PDMP reviewed and appropriate. Refill request sent for full prescription as she has had two partials last two fills due to pharmacy stock.

## 2023-11-19 DIAGNOSIS — D48119 Desmoid tumor: Principal | ICD-10-CM

## 2023-11-19 NOTE — Unmapped (Signed)
 Per El[place]ira's request, and approved by MD, requested Mallorey's 9/26 infusion for hydration, to line up with her MD appointment.  Also sent orders for the school infirmary to be allowed to provide IV hydration as needed as well as monthly (if needed) labs.

## 2023-11-30 DIAGNOSIS — D48119 Desmoid tumor: Principal | ICD-10-CM

## 2023-11-30 NOTE — Unmapped (Addendum)
 The mother called back in regards to their refill of OGSIVEO  100 mg tablet (nirogacestat ). At this time, they have declined refill due to medication being on hold, the see the oncologist this Friday to see if she will resume the medication. Refill assessment call date has been updated per the patient's request.

## 2023-11-30 NOTE — Unmapped (Signed)
 [facility] has made a second and final attempt to reach this patient to refill the following medication:Ogsiveo  100mg .      We have left voicemails on the following phone numbers: [phone], have been unable to leave messages on the following phone numbers: [phone], have sent a MyChart message, have sent a text message to the following phone numbers: [phone], and have sent a Mychart questionnaire..    Dates contacted: 9/15,22  Last scheduled delivery: 10/21/23    The patient may be at risk of non-compliance with this medication. The patient should call the [facility] at [phone]  Option 4, then Option 1: Oncology to refill medication.    Maryla CHRISTELLA [facility] Specialty Technician

## 2023-12-02 DIAGNOSIS — D48119 Desmoid tumor: Principal | ICD-10-CM

## 2023-12-02 MED ORDER — CETIRIZINE 10 MG TABLET
ORAL_TABLET | Freq: Two times a day (BID) | ORAL | 0 refills | 7.00000 days | Status: CP
Start: 2023-12-02 — End: ?

## 2023-12-02 MED ORDER — PREDNISONE 50 MG TABLET
ORAL_TABLET | Freq: Two times a day (BID) | ORAL | 0 refills | 1.00000 days | Status: CP
Start: 2023-12-02 — End: 2024-12-01

## 2023-12-02 NOTE — Unmapped (Signed)
 Pre med plan:  cetirizine  20 mg twice daily for 48 hours prior to infusion and at least 72 hours after;   prednisone  50 mg x 2 doses at 13 and 7 hours prior to infusion   then methylprednisolone  40 mg IV immediately prior to infusion;   diphenhydramine  50 mg IV, famotidine  40 mg IV 30 minutes prior to infusion.     Then give iron  dextran 1 g

## 2023-12-03 NOTE — Unmapped (Signed)
 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    Desmoid fibromatosis, history of [name]     She has had numerous sites of disease, including paraspinal, chest wall, right arm/wrist. She has had several cryoablations with Dr. Babara which have seemed to offer clinical benefit. She trialed sorafenib  which seemed to lead to shrinkage of lesions, but she had significant intolerance with dizziness, presyncope, fatigue, hair loss, rash, GI side effects.      We have discussed at length the nature of desmoid fibromatosis tumors, the fact they can wax and wane independent of therapy, are not cancers in a metastatic potential, but can be locally infiltrative and cause substantial morbidity to the site of disease and occasionally mortality. I reviewed that the emerging standard of care is observation based on the fact that 20-30% will experience spontaneous regression in the year following diagnosis. Given her long experience with desmoids, complete regression may be less likely, but we will still approach this cautiously with parallel goals of avoidance of overtreatment (particularly surgical) and palliation of symptoms / preservation of function.     At first visit in 2022, her lesions were in the shoulder, right forearm, chest area, with a possible desmoid in her left thigh. Since then, she has had waxing waning course with significant pain issues, malnutrition requiring intermittent TPN and tube feeds, multiple GI bleeds, significant anxiety, depression, multiple hospitalizations. Had tumor response to sorafenib  but developed GI bleeding and had to stop. IV iron  infusion led to severe anaphylaxis.    09/16/22: Start nirogacestat .     09/25/22: Admitted with uncontrolled pain and muscle spasms after IR guided procedure. Prolonged hospital stay, neurologic evaluation, managed with muscle relaxers (baclofen , valium ). Discharge 9/11 to rehab. Ramped up on nirogacestat  with some side effects, discharged from rehab 10/2.    03/25/23 - 05/07/23: Prolonged, complex admission for pain, malnutrition. Found to have strep mitis blood culture. NG tube placed, TPN not recommended. Discharged to rehab, but then readmitted with C Diff, treated.    06/26/23: Scans with largely stable disease, although growing abdominal wall tumor.    4/26-5/11: Admitted with abdominal pain, hematochezia. She underwent iron  infusion and EGD, discharged on budesonide . Syncope felt most consistent with POTS.    07/31/23: Nirogacestat  up to 100mg  BID. Budesonide  for GI inflammation. Cards for POTS eval.     09/01/23: Recent abdominal pain led to RUQ US  - mild bil dil but no other findings. Bili not elevated. Cards does not think she meets criteria for POTS. Scans in 1 month.    09/29/23: Scans overall stable; possible enlargement of lesion posterior to right forth rib. Continues on nirogacestat  100 mg BID.    11/03/23: Admitted 10/13/2023-10/22/2023 with GI bleed; superficial ulceration at ileoanal anastomosis. Tolerated IV iron  with premed regimen. Nirogacestat  held due to association with GI bleeding. Suspected secondary adrenal insufficiency (+/- POTS / functional) contributing to syncopal episodes/orthostatic intolerance.    12/04/23: Bloody stool remains too voluminous to resume nirogacestat . IV iron  administered in clinic with Premeds.    Desmoid fibromatosis  Multiple locations,  primarily in mesentery, some extremities. Stable disease with nirogacestat , which may be contributing to diarrhea, limiting therapy. Symptom control complicated by fear of bowel obstructions.  - Will continue to hold nirogacestat  until diarrhea is under better control (< 10 bowel movements / day); would recommend against resuming sorafenib  given risk of poor wound healing & potential upcoming GI procedure  - facilitate follow up with Eleanor Bari, FNP to discuss local resources and medical resume  - Delay MRI for RUE 1 month due to IV Fe    Chronic Bloody Diarrhea  - encouraged to move up [facility] follow up  Also considering antegrade double balloon endoscopy at [facility] to evaluate for bleeding source and side-viewing upper endoscopy exam as recommended for [name] surveillance  - encouraged to increase imodium  up to 4-6 pills daily; Lomotil PRN  - continues with local standing IVF orders     Chronic multifactorial pain, including Cancer-related pain; visceral hyperalgesia  Recent imaging and laboratory studies reassuring against significant increase in lesions. Stress may contribute to pain exacerbation.  - Managed by Pain medicine specialist; last visit 10/23/23  - Consider stress management techniques to help alleviate pain.     Recurrent gastrointestinal bleeding  Iron  deficiency anemia  Slight worsening of anemia with continued symptoms.  - Seen by A&I on recent admission; tolerated IV Fe - can receive outpatient with appropriate premeds:  - cetirizine  20 mg twice daily for 48 hours prior to infusion and at least 72 hours after;   - prednisone  50 mg x 2 doses at 13 and 7 hours prior to infusion   then methylprednisolone  40 mg IV immediately prior to infusion;   - diphenhydramine  50 mg IV, famotidine  40 mg IV 30 minutes prior to infusion.   --> Then give iron  dextran 1 g (given 12/04/23 outpatient)  - new script Tranexamic acid  sent - discussed low, but non-zero risk of clotting vs. Ongoing harms of bleeding  - Ensure adequate dietary protein intake.  - Monitor hemoglobin and iron  panel     Syncopal episodes - continue standing IVF and hydrocortisone   Episodic hypotension & irregular heart beat - Ziopatch ordered    Mental Health & Hot flashes  - continue Lexapro   - connect with Dr. Susi    Patient seen and case discussed with Dr. Lang Favorite.    Cartier Mapel, M.D.  Pediatric Hematology Oncology Fellow, PGY-7        REASON FOR CONSULTATION:   Teshia Mahone is a 23 y.o. female who is seen in consultation at the request of Favorite Lang Masters, MD for evaluation of her desmoid fibromatosis    HISTORY OF PRESENT ILLNESS:      Right arm desmoid is the most bothersome - significant arm cramping, deep aching pain, lasts for ~30 min after cramping starts. Has noticed right hand shaking frequently. 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     Left chest lesion used to be quite uncomfortable, caused swelling in her arm and discomfort. Improved after cryoablation.     Right shoulder blade hurts when she leans back against it, fairly painful. Right chest wall lesion has some associated pain, improving since cryo.     First diagnosed at 23 years old - seen in [place] at first, had numerous cysts. First major issues were two paraspinal lesions, referred to [facility] for biopsy and ultimately resection.  Soon diagnosed with [name], seen by medical genetics early on.  Guided by Dr. Mliss Combs, referred out to  proceduralists as needed.  2011 - had a growing lesion on her back as well as bilateral neck lesions, resected at [facility], found to be desmoid  02/2012 - surgery for thoracic spine desmoid. Maybe another flank lesion removed  2017 - wrist lesion treated by VIR with bupivicaine, kenalog   08/2016 - endoscopy with adenomatous polyp of the colon, non-dysplastic  02/2017 - subcutaneous lesion resection by plastic surgery (postauricular and RUE, RLE) - path revealed epidermal inclusion cysts, osteomas  05/2018: cryotherapy of left anterior chest wall lesion  05/2018: started sulindac  but struggled with adherence due to GI side effects  08/2018: cryo #2 to chest wall lesion  12/10/2018: started sorafenib  due to progression in left paraspinal lesion, ?posterior chest wall lesion  02/2019: discontinued sorafenib  for several reasons: fatigue, dizziness, nausea, hair loss, as well as COVID infection in late November. Derm consulted for rash / hair loss, started on antihistamines, topical steroids. Trialed lower dose but ultimately stopped again 05/2019 due to toxicity.  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 Recently celebrated her 23th birthday with an L&D shift.    She continues to struggle with frequent, bloody bowel movements. She has been taking about 4 Imodium  per day, with 7-10 stools/day. Has missed an exam / assignment and utilized IVF at local clinic twice in the past week for frequent syncopal episodes. Noted to have soft BP & irregular heart rhythm on monitoring device within minutes of syncopal episodes. Has had trouble tolerating hydrocortisone . Ferritin dropped to 10; Hb 8.9 on local labs. Has not yet seen [facility] or [facility] recently; difficulty coordinating with school schedule.    She has increasing pain of her right thigh masses. Mom voiced concern about prolonged break in desmoid-directed therapy and inquires about resuming sorfenib, if diarrhea continues to inhibit nirogacestat .    REVIEW OF SYSTEMS:  A comprehensive review of 12 systems was negative except for pertinent positives noted in HPI.    Past Medical History, Surgical History, Family History were reviewed personally. Any changes were updated as above.    Social History:  Attended [employer] in [place], has been interrupted by health issues;  Wants to be a nurse in pediatric hematology/oncology; also pursuing child life degree  therapy dog, Layla  Mother [name] is a strong advocate, often present at visits    Pregnancy status: premenopausal, interested in future children    ALLERGIES/MEDICATIONS:  Reviewed in Epic    PHYSICAL EXAM:   BP 124/72  - Pulse 107  - Temp 36.4 ??C (97.6 ??F) (Temporal)  - Ht 165.1 cm (5' 5)  - Wt 57 kg (125 lb 11.2 oz)  - SpO2 98%  - BMI 20.92 kg/m??   ECOG 1  Gen - well appearing, mother accompanying her   Eyes - conjunctivae clear, PERRL  ENT -no scleral icterus, moist mucous membranes  Lymph - no cervical or supraclavicular lymphadenopathy appreciated  Resp - clear to auscultation bilaterally, no wheezes, crackles or rales  CV - normal rate, regular rhythm, no lower extremity edema noted  Skin - flushing of skin noted on chest  Neuro - AOx3, EOM intact, no facial droop, speech fluent and coherent, moves all extremities without asymmetry  Psych - affect anxious  MSK - 2 prominent, tender masses on right anterior thigh    PATHOLOGY:   Pathology was personally reviewed as described in the HPI, detailed in Epic.     LABS:  Reviewed in Epic    RADIOLOGY:  Imaging was personally viewed and interpreted as summarized in the history (see above).

## 2023-12-04 ENCOUNTER — Inpatient Hospital Stay: Admit: 2023-12-04 | Discharge: 2023-12-04 | Payer: BLUE CROSS/BLUE SHIELD

## 2023-12-04 ENCOUNTER — Ambulatory Visit: Admit: 2023-12-04 | Discharge: 2023-12-04 | Payer: BLUE CROSS/BLUE SHIELD

## 2023-12-04 ENCOUNTER — Other Ambulatory Visit: Admit: 2023-12-04 | Discharge: 2023-12-04 | Payer: BLUE CROSS/BLUE SHIELD

## 2023-12-04 DIAGNOSIS — R197 Diarrhea, unspecified: Principal | ICD-10-CM

## 2023-12-04 DIAGNOSIS — D48119 Desmoid tumor: Principal | ICD-10-CM

## 2023-12-04 DIAGNOSIS — K529 Noninfective gastroenteritis and colitis, unspecified: Principal | ICD-10-CM

## 2023-12-04 DIAGNOSIS — R55 Syncope and collapse: Principal | ICD-10-CM

## 2023-12-04 DIAGNOSIS — D1391 FAP (familial adenomatous polyposis): Principal | ICD-10-CM

## 2023-12-04 LAB — COMPREHENSIVE METABOLIC PANEL
ALBUMIN: 3.5 g/dL (ref 3.4–5.0)
ALKALINE PHOSPHATASE: 67 U/L (ref 46–116)
ALT (SGPT): 7 U/L — ABNORMAL LOW (ref 10–49)
ANION GAP: 11 mmol/L (ref 5–14)
AST (SGOT): 13 U/L (ref <=34)
BILIRUBIN TOTAL: <0.2 mg/dL — ABNORMAL LOW (ref 0.3–1.2)
BLOOD UREA NITROGEN: 7 mg/dL — ABNORMAL LOW (ref 9–23)
BUN / CREAT RATIO: 14
CALCIUM: 9.2 mg/dL (ref 8.7–10.4)
CHLORIDE: 106 mmol/L (ref 98–107)
CO2: 25 mmol/L (ref 20.0–31.0)
CREATININE: 0.49 mg/dL — ABNORMAL LOW (ref 0.55–1.02)
EGFR CKD-EPI (2021) FEMALE: >90 mL/min/1.73m2 (ref >=60)
GLUCOSE RANDOM: 175 mg/dL (ref 70–179)
POTASSIUM: 4 mmol/L (ref 3.4–4.8)
PROTEIN TOTAL: 6.6 g/dL (ref 5.7–8.2)
SODIUM: 142 mmol/L (ref 135–145)

## 2023-12-04 LAB — CBC W/ AUTO DIFF
BASOPHILS ABSOLUTE COUNT: 0 10*9/L (ref 0.0–0.1)
BASOPHILS RELATIVE PERCENT: 0.1 %
EOSINOPHILS ABSOLUTE COUNT: 0 10*9/L (ref 0.0–0.5)
EOSINOPHILS RELATIVE PERCENT: 0 %
HEMATOCRIT: 29.5 % — ABNORMAL LOW (ref 34.0–44.0)
HEMOGLOBIN: 9.3 g/dL — ABNORMAL LOW (ref 11.3–14.9)
LYMPHOCYTES ABSOLUTE COUNT: 0.5 10*9/L — ABNORMAL LOW (ref 1.1–3.6)
LYMPHOCYTES RELATIVE PERCENT: 8.5 %
MEAN CORPUSCULAR HEMOGLOBIN CONC: 31.5 g/dL — ABNORMAL LOW (ref 32.0–36.0)
MEAN CORPUSCULAR HEMOGLOBIN: 24.4 pg — ABNORMAL LOW (ref 25.9–32.4)
MEAN CORPUSCULAR VOLUME: 77.4 fL — ABNORMAL LOW (ref 77.6–95.7)
MEAN PLATELET VOLUME: 9.4 fL (ref 6.8–10.7)
MONOCYTES ABSOLUTE COUNT: 0 10*9/L — ABNORMAL LOW (ref 0.3–0.8)
MONOCYTES RELATIVE PERCENT: 0.8 %
NEUTROPHILS ABSOLUTE COUNT: 5.5 10*9/L (ref 1.8–7.8)
NEUTROPHILS RELATIVE PERCENT: 90.6 %
PLATELET COUNT: 366 10*9/L (ref 150–450)
RED BLOOD CELL COUNT: 3.81 10*12/L — ABNORMAL LOW (ref 3.95–5.13)
RED CELL DISTRIBUTION WIDTH: 16.4 % — ABNORMAL HIGH (ref 12.2–15.2)
WBC ADJUSTED: 6.1 10*9/L (ref 3.6–11.2)

## 2023-12-04 LAB — SLIDE REVIEW

## 2023-12-04 MED ORDER — TRANEXAMIC ACID 650 MG TABLET
ORAL_TABLET | Freq: Three times a day (TID) | ORAL | 0 refills | 30.00000 days | Status: CP | PRN
Start: 2023-12-04 — End: ?

## 2023-12-04 MED ORDER — DIPHENOXYLATE-ATROPINE 2.5 MG-0.025 MG TABLET
ORAL_TABLET | Freq: Four times a day (QID) | ORAL | 1 refills | 8.00000 days | Status: CP | PRN
Start: 2023-12-04 — End: 2024-12-03

## 2023-12-04 MED ADMIN — diphenhydrAMINE (BENADRYL) injection 25 mg: 25 mg | INTRAVENOUS | @ 23:00:00

## 2023-12-04 MED ADMIN — methylPREDNISolone sodium succinate (SOLU-Medrol) injection 40 mg: 40 mg | INTRAVENOUS | @ 21:00:00 | Stop: 2023-12-04

## 2023-12-04 MED ADMIN — acetaminophen (TYLENOL) tablet 650 mg: 650 mg | ORAL | @ 21:00:00 | Stop: 2023-12-04

## 2023-12-04 MED ADMIN — diphenhydrAMINE (BENADRYL) injection 25 mg: 25 mg | INTRAVENOUS | @ 22:00:00

## 2023-12-04 MED ADMIN — iron dextran (INFED) 1,000 mg in sodium chloride (NS) 0.9 % 250 mL IVPB: 1000 mg | INTRAVENOUS | @ 22:00:00 | Stop: 2023-12-04

## 2023-12-04 MED ADMIN — sodium chloride (NS) 0.9 % infusion: 1000 mL/h | INTRAVENOUS | @ 20:00:00 | Stop: 2023-12-04

## 2023-12-04 MED ADMIN — methylPREDNISolone sodium succinate (SOLU-Medrol) injection 125 mg: 125 mg | INTRAVENOUS

## 2023-12-04 MED ADMIN — diphenhydrAMINE (BENADRYL) injection: 50 mg | INTRAVENOUS | @ 21:00:00 | Stop: 2023-12-04

## 2023-12-04 MED ADMIN — famotidine (PF) (PEPCID) injection 20 mg: 20 mg | INTRAVENOUS | @ 22:00:00

## 2023-12-04 MED ADMIN — famotidine (PF) (PEPCID) injection 40 mg: 40 mg | INTRAVENOUS | @ 21:00:00 | Stop: 2023-12-04

## 2023-12-04 MED ADMIN — iron dextran (INFED) 25 mg in sodium chloride (NS) 0.9 % 25 mL IVPB: 25 mg | INTRAVENOUS | @ 21:00:00 | Stop: 2023-12-04

## 2023-12-04 NOTE — Unmapped (Signed)
 Infusion Progress Note    Patient Name: [name] [name]  Patient Age: 23 y.o.  Encounter Date: 12/04/2023  Allergies[1]    Reason for visit  Infusion Center visit   Chief Complaint: Rash  Oncologist: Dr. Lang Favorite        Assessment/Plan:  Hypersensitivity reaction Grade 1  --Pre-medications: Acetaminophen  650mg , Diphenhydramine  50mg , Famotidine  40mg , Solumedrol 40mg   --Symptoms: Rash on chest  --Amount Infused: 30.5 mL  --Reaction medications: Diphenhydramine  25mg , Famotidine  20mg     Disposition: Okay to proceed with Iron  Dextran 1 gram in the absence of signs and symptoms of HSR.       Interval History/HPI:   [name] [name] is a 24 yo female with IDA 2/2 chronic blood loss in the oncology infusion clinic to receive Iron  Dextran. Patient has a history of HSR  to multiple different Iron  preparations and Iron  infusions have previously been administered inpatient. However, last inpatient Iron  infusion tolerated without serious complication only symptom was mild urticaria. Per Dr. Gaile with [facility] system allergy  consult service recommended premedication with cetirizine  20 mg twice daily for 48 hours prior to infusion and at least 72 hours after; prednisone  50 mg x 2 doses at 13 and 7 hours prior to infusion then methylprednisolone  40 mg IV immediately prior to infusion; and diphenhydramine  50 mg IV, famotidine  40 mg IV 30 minutes prior to infusion.     Today is the first trial of outpatient administration. She completed the test dose and immediately upon completion c/o a rash to her left chest with no other symptoms. VSS. Diphenhydramine  25mg  and Famotidine  20mg  IV administered for HSR Grade 1. Patient monitored for 30 minutes post rescue medications without c/o CP, SOB, prurtius, N/V.       ROS:  All other systems reviewed were negative.      Physical Exam:  Vitals:    12/04/23 1800   BP: 122/65   Pulse: 89   Resp: 18       General:    Healthy-appearing, resting in no acute distress while seated in the infusion chair.  HEENT:   Normocephalic and atraumatic. No scleral icterus or conjunctival injection.   Cardiovascular:    Regular rate, & rhythm. S1/S2 No significant murmurs or gallops.  Respiratory:   Bilateral breath sounds clear to auscultation without adventitious sounds.  No increased work of breathing noted.   Skin:   Grossly intact, warm/dry. Small palm sized area of erythema noted to left chest. No hives noted.    Neuro:   Alert, oriented X4.      Results:  Recent Labs     12/04/23  1151   WBC 6.1   HGB 9.3*   HCT 29.5*   PLT 366   NEUTROABS 5.5   LYMPHSABS 0.5*   MONOSABS 0.0*   EOSABS 0.0   BASOSABS 0.0     Recent Labs     12/04/23  1153   NA 142   K 4.0   CL 106   CO2 25.0   BUN 7*   CREATININE 0.49*   GLU 175   CALCIUM 9.2   ALBUMIN 3.5   PROT 6.6   BILITOT <0.2*   AST 13   ALT 7*   ALKPHOS 67         I personally spent 30 minutes face-to-face and non-face-to-face in the care of this patient, which includes all pre, intra, and post visit time on the date of service.  All documented time was specific to the  E/M visit and does not include any procedures that may have been performed.  Time was spent on clinical assessment, placing orders / referrals, and/or prescription management, all pertaining to the diagnoses listed above.         [name] [name], ANP  ADVANCED PRACTICE PROVIDER FOR INFUSION PROGRAM             [1]   Allergies  Allergen Reactions    Adhesive Tape-Silicones Hives and Rash     Paper tape and Tegederm ok. Biopatch causes blistering but has tolerated CHG Tegaderm    Ferrlecit  [Sodium Ferric Gluconat-Sucrose] Swelling and Rash    Levofloxacin  Swelling and Rash     Swelling in mouth, rash,     Methylnaltrexone       Per Patient: I lost bowel control, severe abdominal cramping, and elevated BP    Neomycin Swelling     Rxn after ear drops; ear swelling    Papaya Hives    Morphine Nausea And Vomiting    Zosyn [Piperacillin-Tazobactam] Itching and Rash     Red and itchy    Adhesive      Other Reaction(s): Not available    Compazine [Prochlorperazine] Other (See Comments)     Extreme agitation    Iron  Analogues     Metoclopramide  Diarrhea     Other Reaction(s): Not available    Reglan  [Metoclopramide  Hcl] Diarrhea    Iron  Dextran Itching     Received iron  dextran 06/08/22 over 12 hours, had itching and redness/flushing during the infusion and for a couple days after. Required IV benadryl  w/flares between doses and ultimately treated w/IV methylpred for 2 days.     Latex, Natural Rubber Rash

## 2023-12-04 NOTE — Unmapped (Signed)
 1750: Pt reports rash to the left side of her. APP advised. Rescue drugs started.

## 2023-12-04 NOTE — Unmapped (Signed)
 Lab on 12/04/2023   Component Date Value Ref Range Status    WBC 12/04/2023 6.1  3.6 - 11.2 10*9/L Final    RBC 12/04/2023 3.81 (L)  3.95 - 5.13 10*12/L Final    HGB 12/04/2023 9.3 (L)  11.3 - 14.9 g/dL Final    HCT 90/73/7974 29.5 (L)  34.0 - 44.0 % Final    MCV 12/04/2023 77.4 (L)  77.6 - 95.7 fL Final    MCH 12/04/2023 24.4 (L)  25.9 - 32.4 pg Final    MCHC 12/04/2023 31.5 (L)  32.0 - 36.0 g/dL Final    RDW 90/73/7974 16.4 (H)  12.2 - 15.2 % Final    MPV 12/04/2023 9.4  6.8 - 10.7 fL Final    Platelet 12/04/2023 366  150 - 450 10*9/L Final    Neutrophils % 12/04/2023 90.6  % Final    Lymphocytes % 12/04/2023 8.5  % Final    Monocytes % 12/04/2023 0.8  % Final    Eosinophils % 12/04/2023 0.0  % Final    Basophils % 12/04/2023 0.1  % Final    Absolute Neutrophils 12/04/2023 5.5  1.8 - 7.8 10*9/L Final    Absolute Lymphocytes 12/04/2023 0.5 (L)  1.1 - 3.6 10*9/L Final    Absolute Monocytes 12/04/2023 0.0 (L)  0.3 - 0.8 10*9/L Final    Absolute Eosinophils 12/04/2023 0.0  0.0 - 0.5 10*9/L Final    Absolute Basophils 12/04/2023 0.0  0.0 - 0.1 10*9/L Final    Microcytosis 12/04/2023 Slight (A)  Not Present Final    Anisocytosis 12/04/2023 Slight (A)  Not Present Final    Hypochromasia 12/04/2023 Marked (A)  Not Present Final    Sodium 12/04/2023 142  135 - 145 mmol/L Final    Potassium 12/04/2023 4.0  3.4 - 4.8 mmol/L Final    Chloride 12/04/2023 106  98 - 107 mmol/L Final    CO2 12/04/2023 25.0  20.0 - 31.0 mmol/L Final    Anion Gap 12/04/2023 11  5 - 14 mmol/L Final    BUN 12/04/2023 7 (L)  9 - 23 mg/dL Final    Creatinine 90/73/7974 0.49 (L)  0.55 - 1.02 mg/dL Final    BUN/Creatinine Ratio 12/04/2023 14   Final    eGFR CKD-EPI (2021) Female 12/04/2023 >90  >=60 mL/min/1.72m2 Final    eGFR calculated with CKD-EPI 2021 equation in accordance with [employer] and [employer] of Nephrology Task Force recommendations.    Glucose 12/04/2023 175  70 - 179 mg/dL Final    Calcium 90/73/7974 9.2  8.7 - 10.4 mg/dL Final    Albumin 90/73/7974 3.5  3.4 - 5.0 g/dL Final    Total Protein 12/04/2023 6.6  5.7 - 8.2 g/dL Final    Total Bilirubin 12/04/2023 <0.2 (L)  0.3 - 1.2 mg/dL Final    AST 90/73/7974 13  <=34 U/L Final    ALT 12/04/2023 7 (L)  10 - 49 U/L Final    Alkaline Phosphatase 12/04/2023 67  46 - 116 U/L Final    Smear Review Comments 12/04/2023 See Comment  Undefined Final    Slide reviewed.  [id number]

## 2023-12-04 NOTE — Unmapped (Signed)
 Handoff received from J.Pirtle, RN. 25mg  benadryl  IV given for itching. Infusion complete, no further complications. IV solumedrol given prior to discharge, APP aware and okay with same. Port de accessed with heparin . Pt discharged stable, no concerns.

## 2023-12-07 DIAGNOSIS — D48119 Desmoid tumor: Principal | ICD-10-CM

## 2023-12-08 DIAGNOSIS — D48119 Desmoid tumor: Principal | ICD-10-CM

## 2023-12-10 DIAGNOSIS — D48119 Desmoid tumor: Principal | ICD-10-CM

## 2023-12-10 NOTE — Unmapped (Signed)
 Patient [name] [name] was called in attempt number 1 to reach them regarding scheduling     The call resulted in: Message left    Please schedule the patient upon their return call on 10/21 for the following:     Lab  Provider Dr. Loris     Thank you,    Arlyne SHAUNNA Guys

## 2023-12-14 DIAGNOSIS — D48119 Desmoid tumor: Principal | ICD-10-CM

## 2023-12-14 NOTE — Unmapped (Signed)
 I spoke with patient [name] [name] to confirm appointments on the following date(s): 10/21 lab, MD visit     Arlyne SHAUNNA Guys

## 2023-12-15 DIAGNOSIS — D48119 Desmoid tumor: Principal | ICD-10-CM

## 2023-12-15 MED ORDER — BACLOFEN 10 MG TABLET
ORAL_TABLET | Freq: Three times a day (TID) | ORAL | 0 refills | 14.00000 days
Start: 2023-12-15 — End: ?

## 2023-12-15 MED ORDER — BUPRENORPHINE 20 MCG/HOUR WEEKLY TRANSDERMAL PATCH
MEDICATED_PATCH | TRANSDERMAL | 2 refills | 28.00000 days
Start: 2023-12-15 — End: ?

## 2023-12-15 NOTE — Unmapped (Signed)
 **PLEASE SEE INPATIENT PAIN PLAN UNDER THE FYI TAB IN EPIC IF NEEDED**    Chronic Pain Follow Up Note    Assessment and Plan  [name] [name] is a 23 y.o. female with past medical history significant for DVT (RUE, s/p Eliquis  x3 months), [name] syndrome s/p colectomy, desmoid tumor, anemia, and severe protein-calorie malnutrition who is being followed at [facility] clinic for abdominal and right shoulder pain that is consistent with the diagnosis of cancer associated pain.    The patient reports they are physically located in [place]  and is currently: at home. I conducted a audio/video visit. I spent  44m 58s on the video call with the patient. I spent an additional 20 minutes on pre- and post-visit activities on the date of service .     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 Pt has continued and now chronic pain related to her tumors, has R shoulder pain as well though this is better controlled now. Follows with [facility] Dr Loris as well as by [facility] oncology.    Unfortunately, [name] [name] has been experiencing continued severe abdominal pain. The etiology of her pain flare cycles is complex with likely multiple contributing factors.  The most likely explanation appears that she has intermittent pain flares that are caused by an unclear underlying process (potentially due to intermittent intestinal obstructions in the setting of her known mesenteric desmoid tumors, which may act as anchor points for torsion of her bowels) that resulted in an increased opioid requirement and often will lead to hospitalization.  Patient has been admitted several times for extended periods of time over the last year often for either pouchitis versus poor p.o. intake versus intermittent bowel obstruction versus pain flares. Persistent issues with her perception of bleeding through the GI tract and poor p.o. intake. Assessment & Plan  Neoplasm-related pain due to desmoid tumors of the gastrointestinal tract  Increased right-sided abdominal pain, potentially related to desmoid tumors. Pain is flared but broadly within her band of tolerance and has resulted in no admission for several months. Differential includes gallbladder issues, but recent labs (alk phos, bili, LFTs) and imaging (CT August) do not support this. Possible abdominal wall involvement of desmoid tumors, but CT scan does not show significant changes. Consideration of cryoablation was discussed with [facility] professionals, but surgical intervention may be necessary due to tumor location.  - Use Voltaren  gel topically over the area of pain up to four times a day.    Chronic pain syndrome with opioid dependence  Chronic pain syndrome with increased opioid use due to clustered clinicals and increased activity. Current use of oxycodone  20 mg four times a day, with a plan to taper down. Previous attempts to reduce dosage have been challenging due to increased pain and activity demands. Discussed plan to transition to oxycodone  10 mg tablets to facilitate tapering after this upcoming fill, which will remain the same.  - Prescribe oxycodone  20 mg, 120 tablets, to be taken up to four times a day x1 month  - Plan to transition to oxycodone  10 mg tablets, 180 tablets, with a goal to reduce to 10 mg three times a day for next fill  - Encourage gradual reduction in dosage and frequency to manage dependence and pain effectively.    Narcotic bowel syndrome  Attempts to reduce opioid use have led to increased bowel activity. Discussed the impact of  opioid reduction on bowel symptoms and the need for ongoing management.  - Continue current bowel management strategies, including the use of Imodium  as needed.    Visceral hyperalgesia and myofascial pain  Visceral hyperalgesia and myofascial pain contributing to overall pain experience. Increased activity and stress from clinicals may exacerbate symptoms. Current management includes baclofen , which has caused drowsiness when used recently. Discussed the need for caution with baclofen  due to drowsiness.  - Continue baclofen  as needed, with caution regarding drowsiness.    Chronic, continuous use of opioids; chronic pain syndrome   Patient is managed on both Butrans  patches for long-acting pain control as a partial mu agonist and oxycodone  for pain flares. Working to wean patient off of oxycodone  and then to work patient down and eventually off of buprenorphine  to improve bowel function while still balancing the need for pain control.  She has remained on oxycodone  20 mg QID on average. She still has severe abdominal pain, especially when having GI bleeds, so we discussed trying to further wean off opioids; she expressed understanding. Patient is managed on a benzodiazepine for anxiety and knows not to take this around the time of her opioid and is working to wean this off as well.    I have reviewed the [facility] Medical Board statement on use of controlled substances for the treatment of pain as well as the CDC Guideline for Prescribing Opioids for Chronic Pain. I have reviewed the [place] Controlled Substance Monitoring Database.    Long Term Use of Opioid Medication   - Compliant, using opioid to facilitate functional goals, no concerns for misuse     PDMP reviewed 12/17/2023 Appropriate   Pill count 12/17/2023 Virtual visit     Urine toxicology 12/23/22 Appropriate +oxy, +benzo, +buprenorphine , due next visit   Treatment agreement 02/18/23 Goals: continue with nursing school, due next visit   Naloxone  rx Date 12/23/22, due at next visit, has had insurance denials and may need OTC however     COMM 08/05/23 7 - higher due to recent hospitalization will update at next visit, will obtain at next in-person visit, due at next visit   Benzodiazepine Yes Ok with minimal use per psychiatry, pt will avoid taking close to opioid doses and only while awake, not near bedtime   Pain Psychology Yes Now seeing Becca   QTC 10/13/23 NSR, Qtc 450   Concerns yes OIH, planning to wean off opioids, planning to minimize benzo use   Opioid Risk MODERATE- see comment Given dose and higher COMM, but working to wean and patient is amenable     Opioid Changes and Yearly MME  Date MME Comments   12/23/22 45 + 1.5 mg BID of Suboxone  Post-discharge regimen, increased suboxone  to decrease oxy   01/21/23 45 + 1 mg TID Suboxone  + additional 10 mg oxy per day x 10 days Split Suboxone  to TID dosing for pain, gave additional #10 oxy 10 mg for additional 10 mg oxy per day for 10 days after wisdom tooth extractions   02/18/23 75 + 20 mcg/h Butrans  Increased to oxy 10 mg x4-5 per day for 2-3 weeks given patient's current pain flare in setting of dramatically increased BM frequency, switched to Butrans  given pt's anxiety surrounding Suboxone  and desire for more consistent analgesia with Butrans  that will also minimize pill burden; plan to wean off of oxy hereafter, Butrans  is a technical net decrease from Suboxone    05/07/23 180 + 20 mcg/h Butrans  Discharged on 20 mg liquid oxycodone  q4h PRN +  Butrans  20 mcg/h given severe pain that has persisted as an outpatient and thus requirement remains, she is willing to work towards weaning   08/05/23 90-150 + 20 mch/hr Butrans  Higher than historical average given recent pain flare and admission but lower than last visit, working to wean   09/22/23 90 + 20 mcg/h Planning to wean down to 45 + butrans  45 by next visit   10/23/23 180 + 20 mch/h Butrans  Increased to maximum outpatient dose given continued severe pain post-discharge, trying to avoid rehospitalization, tolerates this dose as outpatient and inpatient with no issues, planning for close follow up and wean to 20 mg q4h PRN but max 5 doses per day before RTC   12/16/23 120 + 20 mcg/h Butrans  Weaned from high of 180 MME, pausing at 20 mg QID for 1 month then plan to wean down by 10 mg oxy per day each several days to goal of 10 mg TID     Return for in person in December please, MD only please.    PLAN:  Today I have prescribed:  Requested Prescriptions     Signed Prescriptions Disp Refills    baclofen  (LIORESAL ) 10 MG tablet 45 tablet 2     Sig: Take 0.5 tablets (5 mg total) by mouth Three (3) times a day.    buprenorphine  20 mcg/hour PTWK transdermal patch 4 patch 2     Sig: Place 1 patch on the skin every seven (7) days.    oxyCODONE  (ROXICODONE ) 20 mg immediate release tablet 120 tablet 0     Sig: Take 1 tablet (20 mg total) by mouth every six (6) hours as needed for pain. DNF: 12/16/23    oxyCODONE  (ROXICODONE ) 10 mg immediate release tablet 180 tablet 0     Sig: Take 2 tablets (20 mg total) by mouth every eight (8) hours as needed for pain. DNF: 01/15/24     No orders of the defined types were placed in this encounter.     HPI  Renetta Suman is a 23 y.o. being followed at [facility] for complaint of chronic pain localized to her shoulders and abdomen.      Patient discharged from yesterday from the hospital, where she was treated for 10 days for GI bleed.  Her GI bleed was unfortunately complicated by severe orthostatic hypotension and acute on chronic abdominal pain.  She underwent cauterization of hemorrhagic lesions and was given multiple infusions of iron  with overall stabilization of GI bleed and hypotension.  However, patient reports her abdominal pain is much worse since her discharge.  She reports her baseline chronic RLQ abdominal pain is still there, but is now accompanied by new sharp and aching epigastric pain that radiates to her back as well as sharp pelvic pain.  Patient is unclear if this pain is related to procedural interventions well inpatient.  Given this new abdominal pain, patient has been taking her oxycodone  differently.  Instead of taking oxycodone  10 mg every 8 hours as needed, she is now taking oxycodone  20 mg every 4 hours as needed.  This new dose and frequency of oxycodone  is similar to what she was requiring for pain control inpatient.  Because of this, patient is running low on her prescribed oxycodone .    History of Present Illness  Aminat Isabelle Matt is a 23 year old female with chronic pain and iron  deficiency anemia who presents with worsening right-sided abdominal pain and increased medication use.    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 The pain is described as 'knocking the wind out of me' and is severe enough to consider emergency room visits. It worsens at night, affecting her sleep, and is accompanied by breathlessness. She has a history of pelvic floor pain and lower abdominal pain.    She reports increased growth of tumors and cysts, describing them as 'taking off.' She reports that previous imaging showed a tumor in her abdominal wall, and she recalls discussions about possible involvement with her bowel. She has a history of desmoid tumors.    She has a history of iron  deficiency anemia, leading to episodes of syncope and tunnel vision. She continues to experience fatigue and dizziness. She has a history of gastrointestinal bleeding and manages bowel symptoms with medications like Imodium .    Her current medication regimen includes oxycodone , which she uses more frequently due to increased pain, taking approximately four 20 mg doses in a 24-hour period, although she is trying to reduce usage. She also uses Voltaren  gel for localized pain relief  and has been prescribed Butrans  patches and baclofen  for muscle relaxation.    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 Her current lifestyle is much more active compared to the previous year when she was bedbound and in rehabilitation.      Current analgesic regimen:  -Butrans  patch 20 mcg/h  -Oxycodone  20 mg TID  -Ativan  PRN, now approximately 2 times per week  -Tylenol  1000 mg 3 times daily PRN  -Voltaren  1% gel  -Lidocaine  ointment  -Lidocaine  patch  -Baclofen  5 mg 3 times daily      Previous Medication Trials:  NSAIDS: Toradol  (c/f GIB after)  Antidepressant/anxiety: Cymbalta  (stopped due to mood side effects), amitriptyline  (insomnia and increased J-pouch output)  Anticonvulsants: Gabapentin , Lyrica  (both cause lower extremity edema)  Muscle relaxants: Robaxin , baclofen  (most effective of the conventional therapies), p.o. Valium  (attempting to limit due to opioid dependence)  Topicals: lidocaine , voltaren  gel  Short-acting opiates: oxycodone , PO HM  Long-acting opiates: Buprenorphine   Other: Tylenol     Previous Interventions  Procedures:   - TPIs in August 2023 with initial pain flare, subsequent improvement, and near immediate relief flare of pain; hyperalgesic with procedure  - TPIs of Right trapezius, rhomboid, infraspinatus on 12/04/2021.  - TPIs on 10/25  - Celiac plexus block on 11/1 (severe post-procedural pain flare requiring admission for pain control, significant hyperalgesia)  - TPIs 11/22  - Last TPI>1 YR AGO    Infusions:  Lidocaine  infusions helped during inpatient exacerbations of pain more than ketamine  infusions    PT: Yes    Allergies  Allergies   Allergen Reactions    Adhesive Tape-Silicones Hives and Rash     Paper tape and Tegederm ok. Biopatch causes blistering but has tolerated CHG Tegaderm    Ferrlecit  [Sodium Ferric Gluconat-Sucrose] Swelling and Rash    Levofloxacin  Swelling and Rash     Swelling in mouth, rash,     Methylnaltrexone       Per Patient: I lost bowel control, severe abdominal cramping, and elevated BP    Neomycin Swelling     Rxn after ear drops; ear swelling    Papaya Hives    Morphine Nausea And Vomiting    Zosyn [Piperacillin-Tazobactam] Itching and Rash     Red and itchy  Adhesive      Other Reaction(s): Not available    Compazine [Prochlorperazine] Other (See Comments)     Extreme agitation    Iron  Analogues     Metoclopramide  Diarrhea     Other Reaction(s): Not available    Reglan  [Metoclopramide  Hcl] Diarrhea    Iron  Dextran Itching     Received iron  dextran 06/08/22 over 12 hours, had itching and redness/flushing during the infusion and for a couple days after. Required IV benadryl  w/flares between doses and ultimately treated w/IV methylpred for 2 days.     Latex, Natural Rubber Rash       Home Medications    Current Outpatient Medications   Medication Sig Dispense Refill    acetaminophen  (TYLENOL  EXTRA STRENGTH) 500 MG tablet Take 2 tablets (1,000 mg total) by mouth every eight (8) hours.      baclofen  (LIORESAL ) 10 MG tablet Take 0.5 tablets (5 mg total) by mouth Three (3) times a day. 45 tablet 2    buprenorphine  20 mcg/hour PTWK transdermal patch Place 1 patch on the skin every seven (7) days. 4 patch 2    butalbital -acetaminophen -caffeine  (ESGIC ) 50-325-40 mg per tablet Take 1 tablet by mouth two (2) times a day as needed for headache. 20 tablet 0    cetirizine  (ZYRTEC ) 10 MG tablet Take 1 tablet (10 mg total) by mouth two (2) times a day. After iron  infusions      cetirizine  (ZYRTEC ) 10 MG tablet Take 2 tablets (20 mg total) by mouth two (2) times a day. Take starting 2 days before IV iron  and for 5 days after 28 tablet 0    diphenoxylate-atropine (LOMOTIL) 2.5-0.025 mg per tablet Take 1 tablet by mouth four (4) times a day as needed for diarrhea. 30 tablet 1    escitalopram  oxalate (LEXAPRO ) 5 MG tablet Take 1 tablet (5 mg total) by mouth daily. 30 tablet 2    famotidine  (PEPCID ) 20 MG tablet Take 1 tablet (20 mg total) by mouth nightly. 30 tablet 3    fluticasone  propionate (FLONASE ) 50 mcg/actuation nasal spray Use 1 spray under the butrans  patch once every 7 days to prevent allergic reaction to the adhesive      hydrocortisone  (ANUSOL -HC) 2.5 % rectal cream AS DIRECTED      hydrocortisone  (CORTEF ) 10 MG tablet Take 1 tablet (10 mg total) by mouth every morning. 90 tablet 1    lidocaine  (ASPERCREME) 4 % patch Place 1 patch on the skin daily. Apply for 12 hours then remove for 12 hours. 30 patch 0    lidocaine  (XYLOCAINE ) 2 % Soln Mix with Children's Benadryl  and Maalox. Swish and spit every 4 hours as needed for mouth pain 100 mL 1    lidocaine  (XYLOCAINE ) 5 % ointment Apply to affected area daily 37 g 3    loperamide  (IMODIUM ) 2 mg capsule Take 1 capsule (2 mg total) by mouth four (4) times a day as needed. 30 capsule 0    LORazepam  (ATIVAN ) 0.5 MG tablet Take 1 tablet (0.5 mg total) by mouth two (2) times a day as needed for anxiety. 10 tablet 0    naloxegol  (MOVANTIK ) 12.5 mg tablet Take 1 tablet (12.5 mg total) by mouth daily as needed (constipation).      naloxone  (NARCAN ) 4 mg nasal spray One spray in either nostril once for known/suspected opioid overdose. May repeat every 2-3 minutes in alternating nostril til EMS arrives 1 each 0    [Paused] nirogacestat  (OGSIVEO ) 100 mg tablet  Take 1 tablet (100 mg total) by mouth two (2) times a day. Swallow tablets whole; do not break, crush, or chew. 60 tablet 5    ondansetron  (ZOFRAN -ODT) 8 MG disintegrating tablet Dissolve 1 tablet (8 mg total) in the mouth every eight (8) hours as needed for nausea. 30 tablet 2    [START ON 01/15/2024] oxyCODONE  (ROXICODONE ) 10 mg immediate release tablet Take 2 tablets (20 mg total) by mouth every eight (8) hours as needed for pain. DNF: 01/15/24 180 tablet 0    oxyCODONE  (ROXICODONE ) 20 mg immediate release tablet Take 1 tablet (20 mg total) by mouth every six (6) hours as needed for pain. DNF: 12/16/23 120 tablet 0    pantoprazole  (PROTONIX ) 40 MG tablet Take 1 tablet (40 mg total) by mouth Two (2) times a day (30 minutes before a meal). 60 tablet 0    predniSONE  (DELTASONE ) 50 MG tablet Take 1 tablet (50 mg total) by mouth two (2) times a day. Take 13 and 7 hours prior to iron  infusion. 2 tablet 0    promethazine  (PHENERGAN ) 12.5 MG tablet Take 1 tablet (12.5 mg total) by mouth every six (6) hours as needed. 20 tablet 0    scopolamine  (TRANSDERM-SCOP) 1 mg over 3 days Place 1 patch (1 mg total) on the skin every third day. 10 patch 0    sucralfate  (CARAFATE ) 100 mg/mL suspension Take 10 mL (1 g total) by mouth four (4) times a day.      topiramate  (TOPAMAX ) 25 MG capsule Take 1 capsule (25 mg total) by mouth two (2) times a day. 60 capsule 2    tranexamic acid  650 mg Tab tablet Take 2 tablets (1,300 mg total) by mouth Three (3) times a day as needed. 180 tablet 0     No current facility-administered medications for this visit.     Review Of Systems  Negative except as outlined in HPI above    Physical Exam    VITALS:   Virtual visit     Wt Readings from Last 3 Encounters:   12/04/23 57 kg (125 lb 11.2 oz)   11/03/23 57.1 kg (125 lb 12.8 oz)   10/15/23 54.7 kg (120 lb 9.6 oz)     GENERAL:  The patient is well developed, well-nourished and appears to be in no apparent distress. The patient is pleasant and interactive. Patient is a good historian.  HEENT:    Reveals normocephalic/atraumatic. Clear sclera. Mucous membranes are moist.  RESPIRATORY:   Normal work of breathing.  No supplemental oxygen  seen  NEUROLOGIC:    The patient was alert and oriented, speech fluent, normal language.   MUSCULAR:  Unable to assess given virtual visit  SKIN:   No obvious rashes, lesions, or erythema.  PSYCHIATRIC:  Appropriate mood and affect.  No pressured speech.    I saw and examined the patient while utilizing an artificial intellegence scribe, Abridge, which is embedded into Epic and therefore compliant with relevant privacy standards. I reviewed the AI scribe's note and edited it as needed for accuracy, and I agree with the findings and plan of care as transcribed by the AI scribe and edited by myself.    Vinie Campbell Delores Mickey, MD  Assistant Professor of Anesthesiology  Department of Anesthesiology   Divisions of General Anesthesiology and Pain Medicine

## 2023-12-15 NOTE — Unmapped (Signed)
 Requested appointments for IVF q2 weeks PRN.

## 2023-12-16 ENCOUNTER — Encounter
Admit: 2023-12-16 | Discharge: 2023-12-17 | Payer: BLUE CROSS/BLUE SHIELD | Attending: Student in an Organized Health Care Education/Training Program | Primary: Student in an Organized Health Care Education/Training Program

## 2023-12-16 DIAGNOSIS — M7918 Myalgia, other site: Principal | ICD-10-CM

## 2023-12-16 DIAGNOSIS — D48119 Desmoid tumor: Principal | ICD-10-CM

## 2023-12-16 DIAGNOSIS — Z79899 Other long term (current) drug therapy: Principal | ICD-10-CM

## 2023-12-16 DIAGNOSIS — G893 Neoplasm related pain (acute) (chronic): Principal | ICD-10-CM

## 2023-12-16 DIAGNOSIS — G894 Chronic pain syndrome: Principal | ICD-10-CM

## 2023-12-16 DIAGNOSIS — R1084 Generalized abdominal pain: Principal | ICD-10-CM

## 2023-12-16 DIAGNOSIS — E860 Dehydration: Principal | ICD-10-CM

## 2023-12-16 MED ORDER — BUPRENORPHINE 20 MCG/HOUR WEEKLY TRANSDERMAL PATCH
MEDICATED_PATCH | TRANSDERMAL | 2 refills | 28.00000 days | Status: CP
Start: 2023-12-16 — End: ?

## 2023-12-16 MED ORDER — OXYCODONE 20 MG TABLET
ORAL_TABLET | Freq: Four times a day (QID) | ORAL | 0 refills | 30.00000 days | Status: CP | PRN
Start: 2023-12-16 — End: ?

## 2023-12-16 MED ORDER — BACLOFEN 10 MG TABLET
ORAL_TABLET | Freq: Three times a day (TID) | ORAL | 2 refills | 30.00000 days | Status: CP
Start: 2023-12-16 — End: 2024-03-15

## 2023-12-16 NOTE — Unmapped (Signed)
 Addended by: Tate Zagal N on: 12/16/2023 02:55 PM     Modules accepted: Orders

## 2023-12-16 NOTE — Unmapped (Signed)
 VISIT SUMMARY:  Today, we discussed your worsening right-sided abdominal pain and increased medication use. We reviewed your history of desmoid tumors, iron  deficiency anemia, and chronic pain. We also talked about your increased physical activity and stress from clinical rotations, which may be contributing to your symptoms.    YOUR PLAN:  -NEOPLASM-RELATED PAIN DUE TO DESMOID TUMORS OF THE GASTROINTESTINAL TRACT: Your abdominal pain is likely related to desmoid tumors, which are non-cancerous growths that can cause significant pain and discomfort. We discussed the possibility of surgical intervention due to the tumor's location. In the meantime, you should use Voltaren  gel topically over the area of pain up to four times a day.    -CHRONIC PAIN SYNDROME WITH OPIOID DEPENDENCE: Chronic pain syndrome is a long-term pain condition that has led to increased use of opioids. We plan to transition you to a lower dose of oxycodone  to help manage your pain and reduce dependence. You will start with oxycodone  20 mg, up to four times a day, and then gradually reduce to 10 mg tablets, aiming for three times a day.    -NARCOTIC BOWEL SYNDROME: Narcotic bowel syndrome is a condition where opioid use leads to increased bowel movements and abdominal pain. You should continue your current bowel management strategies, including using Imodium  as needed.    -VISCERAL HYPERALGESIA AND MYOFASCIAL PAIN: Visceral hyperalgesia and myofascial pain are conditions that cause increased sensitivity and muscle pain. Increased activity and stress may worsen these symptoms. You should continue using baclofen  as needed, but be cautious of drowsiness.    INSTRUCTIONS:  Please follow up with us  if your symptoms worsen or if you have any concerns about your medication regimen. We will continue to monitor your condition and adjust your treatment plan as needed.

## 2023-12-17 DIAGNOSIS — D48119 Desmoid tumor: Principal | ICD-10-CM

## 2023-12-18 DIAGNOSIS — D48119 Desmoid tumor: Principal | ICD-10-CM

## 2023-12-21 DIAGNOSIS — D48119 Desmoid tumor: Principal | ICD-10-CM

## 2023-12-21 NOTE — Unmapped (Signed)
 The [facility] has made a second and final attempt to reach this patient to refill the following medication:Ogsiveo  10mg .      We have left voicemails on the following phone numbers: [phone], have sent a MyChart message, have sent a text message to the following phone numbers: [phone], and have sent a Mychart questionnaire..    Dates contacted: 10/6,13  Last scheduled delivery: 10/21/22    The patient may be at risk of non-compliance with this medication. The patient should call the [facility] at [phone]  Option 4, then Option 1: Oncology to refill medication.    Maryla CHRISTELLA Koleen [facility] Specialty Technician

## 2023-12-22 NOTE — Unmapped (Addendum)
 [name] [name] has been contacted in regards to their refill of Ogsiveo . At this time, they have declined refill due to patient having 14+ doses remaining. Refill assessment call date has been updated per the patient's request.

## 2023-12-23 DIAGNOSIS — D48119 Desmoid tumor: Principal | ICD-10-CM

## 2023-12-23 NOTE — Unmapped (Unsigned)
 Copied from CRM #[id number]. Topic: Return Scheduling - Cancel/Reschedule  >> Dec 23, 2023  4:23 PM Jackson K wrote:      Hi ,    Patient [name] [name] contacted the Communication Center to reschedule their appointment for 12/29/23.  The original appointment has not been cancelled.    Cancellation Reason: Patient is in school Tues, Wed & Thurs. Prefer Friday appts.    Patient's appointment requires reschedule.    Oncology Return Appointment Symptoms Y N: NO - Send to return scheduler     Thank you,  Jackson DELENA Aid  [facility]   [phone]

## 2023-12-25 DIAGNOSIS — D48119 Desmoid tumor: Principal | ICD-10-CM

## 2023-12-28 NOTE — Unmapped (Signed)
 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    Desmoid fibromatosis, history of [name]     She has had numerous sites of disease, including paraspinal, chest wall, right arm/wrist. She has had several cryoablations with Dr. Babara which have seemed to offer clinical benefit. She trialed sorafenib  which seemed to lead to shrinkage of lesions, but she had significant intolerance with dizziness, presyncope, fatigue, hair loss, rash, GI side effects.      We have discussed at length the nature of desmoid fibromatosis tumors, the fact they can wax and wane independent of therapy, are not cancers in a metastatic potential, but can be locally infiltrative and cause substantial morbidity to the site of disease and occasionally mortality. I reviewed that the emerging standard of care is observation based on the fact that 20-30% will experience spontaneous regression in the year following diagnosis. Given her long experience with desmoids, complete regression may be less likely, but we will still approach this cautiously with parallel goals of avoidance of overtreatment (particularly surgical) and palliation of symptoms / preservation of function.     At first visit in 2022, her lesions were in the shoulder, right forearm, chest area, with a possible desmoid in her left thigh. Since then, she has had waxing waning course with significant pain issues, malnutrition requiring intermittent TPN and tube feeds, multiple GI bleeds, significant anxiety, depression, multiple hospitalizations. Had tumor response to sorafenib  but developed GI bleeding and had to stop. IV iron  infusion led to severe anaphylaxis.    09/16/22: Start nirogacestat .     09/25/22: Admitted with uncontrolled pain and muscle spasms after IR guided procedure. Prolonged hospital stay, neurologic evaluation, managed with muscle relaxers (baclofen , valium ). Discharge 9/11 to rehab. Ramped up on nirogacestat  with some side effects, discharged from rehab 10/2.    03/25/23 - 05/07/23: Prolonged, complex admission for pain, malnutrition. Found to have strep mitis blood culture. NG tube placed, TPN not recommended. Discharged to rehab, but then readmitted with C Diff, treated.    06/26/23: Scans with largely stable disease, although growing abdominal wall tumor.    4/26-5/11: Admitted with abdominal pain, hematochezia. She underwent iron  infusion and EGD, discharged on budesonide . Syncope felt most consistent with POTS.    07/31/23: Nirogacestat  up to 100mg  BID. Budesonide  for GI inflammation. Cards for POTS eval.     09/01/23: Recent abdominal pain led to RUQ US  - mild bil dil but no other findings. Bili not elevated. Cards does not think she meets criteria for POTS. Scans in 1 month.    09/29/23: Scans overall stable; possible enlargement of lesion posterior to right forth rib. Continues on nirogacestat  100 mg BID.    11/03/23: Admitted 10/13/2023-10/22/2023 with GI bleed; superficial ulceration at ileoanal anastomosis. Tolerated IV iron  with premed regimen. Nirogacestat  held due to association with GI bleeding. Suspected secondary adrenal insufficiency (+/- POTS / functional) contributing to syncopal episodes/orthostatic intolerance.    12/04/23: Bloody stool remains too voluminous to resume nirogacestat . IV iron  administered in clinic with Premeds.    12/29/23: Still having GIB. Hgb  stable. Scans in 1 mo including RUE and RLU scans. RUQ US  today if able. Follow up with GI as scheduled. Hgb 10.9 today.     Desmoid fibromatosis  Multiple locations, primarily in mesentery, some extremities. Stable disease with nirogacestat , which may be contributing to diarrhea, limiting therapy. Symptom control complicated by fear of bowel obstructions.  - Will continue to hold nirogacestat  until diarrhea is under better control (< 10 bowel movements / day); would recommend against resuming sorafenib  given risk of poor wound healing & potential upcoming GI procedure  - facilitate follow up with Eleanor Bari, FNP to discuss local resources and medical resume  - Delayed MRI for RUE 1 month due to IV Fe 12/04/23 - already ordered  - MRI Chest, Abdomen, Pelvis and RUE in 1 month before visit    Chronic Bloody Diarrhea  - encouraged upcoming [facility] follow up  Also considering antegrade double balloon endoscopy at [facility] to evaluate for bleeding source and side-viewing upper endoscopy exam as recommended for [name] surveillance  - encouraged to increase imodium  up to 4-6 pills daily; Lomotil PRN; refill sent  - continues with local standing IVF orders     Chronic multifactorial pain, including Cancer-related pain; visceral hyperalgesia  Recent imaging and laboratory studies reassuring against significant increase in lesions. Stress may contribute to pain exacerbation.  - Managed by Pain medicine specialist; last visit 12/16/23  - Consider stress management techniques to help alleviate pain.     Recurrent gastrointestinal bleeding  Iron  deficiency anemia  Slight worsening of anemia with continued symptoms.  - Seen by A&I on recent admission; tolerated IV Fe - can receive outpatient with appropriate premeds:  - cetirizine  20 mg twice daily for 48 hours prior to infusion and at least 72 hours after;   - prednisone  50 mg x 2 doses at 13 and 7 hours prior to infusion   then methylprednisolone  40 mg IV immediately prior to infusion;   - diphenhydramine  50 mg IV, famotidine  40 mg IV 30 minutes prior to infusion.   --> Then give iron  dextran 1 g (given 12/04/23 outpatient)  - new script Tranexamic acid  sent - discussed low, but non-zero risk of clotting vs. Ongoing harms of bleeding  - Ensure adequate dietary protein intake.  - Monitor hemoglobin and iron  panel     Syncopal episodes - continue standing IVF and hydrocortisone   Episodic hypotension & irregular heart beat - Ziopatch ordered    Mental Health & Hot flashes  - continue Lexapro   - connect with Dr. Susi    HCM  -Flu shot today    Almarie BROCKS. Romanda Turrubiates MD PhD  [facility] H[facility]ology Oncology    Orders Placed This Encounter   Procedures    MRI Abdomen Pelvis W Wo Contrast     Standing Status:   Future     Expected Date:   01/29/2024     Expiration Date:   12/28/2024     Is the patient pregnant?:   No     What is the patient's sedation requirement?:   No Sedation     Does the patient have metallic implants or external cardiac devices?:   No Implants     Performed at:   [facility]     Protocol:   Routine     Does the patient have any history of allergic reaction during injection of IV contrast?:   No     Reason for Exam::   Desmoid fibromatosis    MRI Chest W  Wo Contrast MSK     Standing Status:   Future     Expected Date:   01/29/2024     Expiration Date:   12/28/2024     Reason for Exam::   Desmoid fibromatosis     Is the patient pregnant?:   No     What is the patient's sedation requirement?:   No Sedation     Does the patient have metallic implants or external cardiac devices?:   No Implants     Performed at:   [facility]     Does the patient have any history of allergic reaction during injection of IV contrast?:   No    US  RUQ W Gallbladder     Standing Status:   Future     Expected Date:   12/29/2023     Expiration Date:   12/28/2024     Performed at:   [facility]     What is the patient's sedation requirement?:   No Sedation     Reason for Exam::   RUQ pain, concern for cholecystitis     Is the patient pregnant?:   No    MRI Lower Extremity Non-Joint Right W Wo Contrast     Standing Status:   Future     Expected Date:   01/29/2024     Expiration Date:   12/28/2024     Which body part?:   Thigh     Reason for Exam:: desmoids on R lateral thigh     Is the patient pregnant?:   No     What is the patient's sedation requirement?:   No Sedation     Does the patient have metallic implants or external cardiac devices?:   No Implants     Performed at:   [facility]     Does the patient have any history of allergic reaction during injection of IV contrast?:   No    INFLUENZA VACCINE IIV3(IM)(PF)6 MOS UP    CBC w/ Differential     Standing Status:   Future     Expected Date:   01/29/2024     Expiration Date:   12/28/2024     Release to patient:   Immediate [1]    Comprehensive Metabolic Panel     Standing Status:   Future     Expected Date:   01/29/2024     Expiration Date:   12/28/2024     Is this a fasting order?:   No     Release to patient:   Immediate [1]           Patient seen and plan discussed and developed with Dr. Loris.    REASON FOR CONSULTATION:   [name] [name] is a 23 y.o. female who is seen in consultation at the request of Loris Lang Masters, MD for evaluation of her desmoid fibromatosis    HISTORY OF PRESENT ILLNESS:    Right arm desmoid is the most bothersome - significant arm cramping, deep aching pain, lasts for ~30 min after cramping starts. Has noticed right hand shaking frequently. 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     Left chest lesion used to be quite uncomfortable, caused swelling in her arm and discomfort. Improved after cryoablation.     Right shoulder blade hurts when she leans back against it, fairly painful. Right chest wall lesion has some associated pain, improving since cryo.     First diagnosed at 23 years old - seen  in [place] at first, had numerous cysts. First major issues were two paraspinal lesions, referred to [facility] for biopsy and ultimately resection.  Soon diagnosed with [name], seen by medical genetics early on.  Guided by Dr. Mliss Combs, referred out to proceduralists as needed.  2011 - had a growing lesion on her back as well as bilateral neck lesions, resected at [facility], found to be desmoid  02/2012 - surgery for thoracic spine desmoid. Maybe another flank lesion removed  2017 - wrist lesion treated by VIR with bupivicaine, kenalog   08/2016 - endoscopy with adenomatous polyp of the colon, non-dysplastic  02/2017 - subcutaneous lesion resection by plastic surgery (postauricular and RUE, RLE) - path revealed epidermal inclusion cysts, osteomas  05/2018: cryotherapy of left anterior chest wall lesion  05/2018: started sulindac  but struggled with adherence due to GI side effects  08/2018: cryo #2 to chest wall lesion  12/10/2018: started sorafenib  due to progression in left paraspinal lesion, ?posterior chest wall lesion  02/2019: discontinued sorafenib  for several reasons: fatigue, dizziness, nausea, hair loss, as well as COVID infection in late November. Derm consulted for rash / hair loss, started on antihistamines, topical steroids. Trialed lower dose but ultimately stopped again 05/2019 due to toxicity.  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 Recently celebrated her 23th birthday with an L&D shift where she helped deliver a baby.    She has increasing pain of her right thigh masses. Mom voiced concern about prolonged break in desmoid-directed therapy and inquires about resuming sorfenib, if diarrhea continues to inhibit nirogacestat .    Today 12/29/23:  Relentless pain and continues talking to her pain doctor and has slowed down her taper; worried about her gallbladder because having RUQ pain and epigastric pain that has not been relieved with heartburn meds or GasX and now increase in nocturnal pain. Bowel patterns changing but ran out of imodium  and got an OTC replacement she thinks is not working as well. Decreased PO intake and weight is down a few pounds. She notes she is crying about pain more often. SOB with pain after running around the wards.  Notes pain may be worse just because she is walking more and working 8 hours shifts.    Had gray BMs and then started having GIB again starting again 1-2 weeks ago, bright red, bleeding with every bowel movement. Pictures in media tab. Taking the OTC imodium  4x daily. Clots possibly present but also on her period. Pain flares with the bleeding. Sees GI at [facility] in a couple weeks. Cysts/tumors on buttocks and she thinks she had a cyst vs tumor burst/bruise recently. Fell on right knee and now has a new lesion on R knee and 2 more painful lesions on Right lateral thigh, less than 6 months old.    History of Present Illness  Varie Monzerrath Mcburney is a 24 year old female with a history of tumors and chronic pain who presents with increased pain and gastrointestinal bleeding.    She experiences relentless, sharp, heartburn-like pain located in multiple areas including her side, right lower quadrant, and pelvic  region, radiating to her lower back. The pain is exacerbated by activity and mobility, which has increased due to her college classes and clinicals. She experiences significant sleep disturbances, often not falling asleep until early morning due to increased pain at night.    Her bowel movements have changed, with increased frequency and urgency, and she has noted a change in stool consistency and color, including grayish and mucousy presentations. She reports bright red bleeding with every bowel movement, sometimes with blood clots, and associates increased pain with heavier bleeding. She uses over-the-counter Imodium  four times daily to manage her bowel movements, but finds it less effective than the prescribed version. She has a J pouch and has experienced ulcers in the past, contributing to her bowel issues.    She has lost weight and reports decreased appetite due to pain and discomfort with eating. She uses pain medication to manage eating-related pain flares. She also experiences shortness of breath during pain flares, feeling 'like I can't breathe.'    She describes a recent incident where she may have burst a cyst on her buttocks, leading to bruising and a hard presentation. She is concerned about the possibility of it being a tumor or cyst, as she has a history of both. She also mentions a new lesion on her knee following a fall, which is painful.    She reports difficulty with urination, including a delayed ability to release her bladder and a loss of sensation to urinate since her colon removal. She experiences flushing and low-grade fevers, though she typically does not run fevers even when ill.         REVIEW OF SYSTEMS:  A comprehensive review of 12 systems was negative except for pertinent positives noted in HPI.    Past Medical History, Surgical History, Family History were reviewed personally. Any changes were updated as above.    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   ECOG 1  Gen - well appearing, mother accompanying her   Eyes - conjunctivae clear, PERRL  ENT -no scleral icterus, moist mucous membranes  Lymph - no cervical or supraclavicular lymphadenopathy appreciated  Resp - clear to auscultation bilaterally, no wheezes, crackles or rales  CV - normal rate, regular rhythm, no lower extremity edema noted  Skin - flushing of skin noted on chest  Neuro - AOx3, EOM intact, no facial droop, speech fluent and coherent, moves all extremities without asymmetry  Psych - affect anxious  MSK - 2 prominent, tender masses on right anterior thigh\, 1 nonpainful mass on R patella at 4-7 o'clock position  GU - healing small bruise of left buttocks    PATHOLOGY:   Pathology was personally reviewed as described in the HPI, detailed in Epic.     LABS:  Reviewed in Epic    RADIOLOGY:  Imaging was personally viewed and interpreted as summarized in the history (see above).

## 2023-12-29 ENCOUNTER — Inpatient Hospital Stay: Admit: 2023-12-29 | Discharge: 2023-12-29 | Payer: BLUE CROSS/BLUE SHIELD

## 2023-12-29 ENCOUNTER — Other Ambulatory Visit: Admit: 2023-12-29 | Discharge: 2023-12-29 | Payer: BLUE CROSS/BLUE SHIELD

## 2023-12-29 ENCOUNTER — Ambulatory Visit: Admit: 2023-12-29 | Discharge: 2023-12-29 | Payer: BLUE CROSS/BLUE SHIELD

## 2023-12-29 DIAGNOSIS — D48119 Desmoid tumor: Principal | ICD-10-CM

## 2023-12-29 DIAGNOSIS — R55 Syncope and collapse: Principal | ICD-10-CM

## 2023-12-29 DIAGNOSIS — K922 Gastrointestinal hemorrhage, unspecified: Principal | ICD-10-CM

## 2023-12-29 DIAGNOSIS — D500 Iron deficiency anemia secondary to blood loss (chronic): Principal | ICD-10-CM

## 2023-12-29 DIAGNOSIS — D1391 FAP (familial adenomatous polyposis): Principal | ICD-10-CM

## 2023-12-29 DIAGNOSIS — K660 Peritoneal adhesions (postprocedural) (postinfection): Principal | ICD-10-CM

## 2023-12-29 DIAGNOSIS — R109 Unspecified abdominal pain: Principal | ICD-10-CM

## 2023-12-29 DIAGNOSIS — R197 Diarrhea, unspecified: Principal | ICD-10-CM

## 2023-12-29 DIAGNOSIS — Z79899 Other long term (current) drug therapy: Principal | ICD-10-CM

## 2023-12-29 DIAGNOSIS — E860 Dehydration: Principal | ICD-10-CM

## 2023-12-29 LAB — COMPREHENSIVE METABOLIC PANEL
ALBUMIN: 3.6 g/dL (ref 3.4–5.0)
ALKALINE PHOSPHATASE: 56 U/L (ref 46–116)
ALT (SGPT): 13 U/L (ref 10–49)
ANION GAP: 8 mmol/L (ref 5–14)
AST (SGOT): 12 U/L (ref <=34)
BILIRUBIN TOTAL: 0.2 mg/dL — ABNORMAL LOW (ref 0.3–1.2)
BLOOD UREA NITROGEN: 13 mg/dL (ref 9–23)
BUN / CREAT RATIO: 23
CALCIUM: 9.7 mg/dL (ref 8.7–10.4)
CHLORIDE: 106 mmol/L (ref 98–107)
CO2: 29 mmol/L (ref 20.0–31.0)
CREATININE: 0.57 mg/dL (ref 0.55–1.02)
EGFR CKD-EPI (2021) FEMALE: >90 mL/min/1.73m2 (ref >=60)
GLUCOSE RANDOM: 97 mg/dL (ref 70–179)
POTASSIUM: 3.8 mmol/L (ref 3.4–4.8)
PROTEIN TOTAL: 6.6 g/dL (ref 5.7–8.2)
SODIUM: 143 mmol/L (ref 135–145)

## 2023-12-29 LAB — CBC W/ AUTO DIFF
BASOPHILS ABSOLUTE COUNT: 0 10*9/L (ref 0.0–0.1)
BASOPHILS RELATIVE PERCENT: 0.6 %
EOSINOPHILS ABSOLUTE COUNT: 0.1 10*9/L (ref 0.0–0.5)
EOSINOPHILS RELATIVE PERCENT: 1.7 %
HEMATOCRIT: 34.1 % (ref 34.0–44.0)
HEMOGLOBIN: 10.9 g/dL — ABNORMAL LOW (ref 11.3–14.9)
LYMPHOCYTES ABSOLUTE COUNT: 1.6 10*9/L (ref 1.1–3.6)
LYMPHOCYTES RELATIVE PERCENT: 30.1 %
MEAN CORPUSCULAR HEMOGLOBIN CONC: 31.9 g/dL — ABNORMAL LOW (ref 32.0–36.0)
MEAN CORPUSCULAR HEMOGLOBIN: 26.2 pg (ref 25.9–32.4)
MEAN CORPUSCULAR VOLUME: 82 fL (ref 77.6–95.7)
MEAN PLATELET VOLUME: 9.2 fL (ref 6.8–10.7)
MONOCYTES ABSOLUTE COUNT: 0.5 10*9/L (ref 0.3–0.8)
MONOCYTES RELATIVE PERCENT: 10.1 %
NEUTROPHILS ABSOLUTE COUNT: 3.1 10*9/L (ref 1.8–7.8)
NEUTROPHILS RELATIVE PERCENT: 57.5 %
PLATELET COUNT: 319 10*9/L (ref 150–450)
RED BLOOD CELL COUNT: 4.16 10*12/L (ref 3.95–5.13)
RED CELL DISTRIBUTION WIDTH: 19.5 % — ABNORMAL HIGH (ref 12.2–15.2)
WBC ADJUSTED: 5.4 10*9/L (ref 3.6–11.2)

## 2023-12-29 MED ORDER — LOPERAMIDE 2 MG CAPSULE
ORAL_CAPSULE | Freq: Four times a day (QID) | ORAL | 3 refills | 25.00000 days | Status: CP | PRN
Start: 2023-12-29 — End: ?
  Filled 2023-12-29: qty 100, 25d supply, fill #0

## 2023-12-29 MED ADMIN — heparin, porcine (PF) 100 unit/mL injection 500 Units: 500 [IU] | INTRAVENOUS | @ 21:00:00 | Stop: 2023-12-30

## 2023-12-29 MED ADMIN — sodium chloride (NS) 0.9 % infusion: 1000 mL/h | INTRAVENOUS | @ 20:00:00 | Stop: 2023-12-29

## 2023-12-29 NOTE — Unmapped (Signed)
 RED ZONE Means: RED ZONE: Take action now!     You need to be seen right away  Symptoms are at a severe level of discomfort    Call 911 or go to your nearest  Hospital for help     - Bleeding that will not stop    - Hard to breathe    - New seizure - Chest pain  - Fall or passing out  -Thoughts of hurting    yourself or others      Call 911 if you are going into the RED ZONE                  YELLOW ZONE Means:     Please call with any new or worsening symptom(s), even if not on this list.  Call [phone]  After hours, weekends, and holidays - you will reach a long recording with specific instructions, If not in an emergency such as above, please listen closely all the way to the end and choose the option that relates to your need.   You can be seen by a provider the same day through our Same Day Acute Care for Patients with Cancer program.      YELLOW ZONE: Take action today     Symptoms are new or worsening  You are not within your goal range for:    - Pain    - Shortness of breath    - Bleeding (nose, urine, stool, wound)    - Feeling sick to your stomach and throwing up    - Mouth sores/pain in your mouth or throat    - Hard stool or very loose stools (increase in       ostomy output)    - No urine for 12 hours    - Feeding tube or other catheter/tube issue    - Redness or pain at previous IV or port/catheter site    - Depressed or anxiety   - Swelling (leg, arm, abdomen,     face, neck)  - Skin rash or skin changes  - Wound issues (redness, drainage,    re-opened)  - Confusion  - Vision changes  - Fever >100.4 F or chills  - Worsening cough with mucus that is    green, yellow, or bloody  - Pain or burning when going to the    bathroom  - Home Infusion Pump Issue- call    [phone]         Call your healthcare provider if you are going into the YELLOW ZONE     GREEN ZONE Means:  Your symptoms are under controls  Continue to take your medicine as ordered  Keep all visits to the provider GREEN ZONE: You are in control  No increase or worsening symptoms  Able to take your medicine  Able to drink and eat    - DO NOT use MyChart messages to report red or yellow symptoms. Allow up to 3    business days for a reply.  -MyChart is for non-urgent medication refills, scheduling requests, or other general questions.         [id number] Rev. 09/06/2021  Approved by Oncology Patient Education Committee     Lab on 12/29/2023   Component Date Value Ref Range Status    Sodium 12/29/2023 143  135 - 145 mmol/L Final    Potassium 12/29/2023 3.8  3.4 - 4.8 mmol/L Final    Chloride 12/29/2023 106  98 - 107 mmol/L Final  CO2 12/29/2023 29.0  20.0 - 31.0 mmol/L Final    Anion Gap 12/29/2023 8  5 - 14 mmol/L Final    BUN 12/29/2023 13  9 - 23 mg/dL Final    Creatinine 89/78/7974 0.57  0.55 - 1.02 mg/dL Final    BUN/Creatinine Ratio 12/29/2023 23   Final    eGFR CKD-EPI (2021) Female 12/29/2023 >90  >=60 mL/min/1.83m2 Final    eGFR calculated with CKD-EPI 2021 equation in accordance with [employer] and [employer] of Nephrology Task Force recommendations.    Glucose 12/29/2023 97  70 - 179 mg/dL Final    Calcium 89/78/7974 9.7  8.7 - 10.4 mg/dL Final    Albumin 89/78/7974 3.6  3.4 - 5.0 g/dL Final    Total Protein 12/29/2023 6.6  5.7 - 8.2 g/dL Final    Total Bilirubin 12/29/2023 0.2 (L)  0.3 - 1.2 mg/dL Final    AST 89/78/7974 12  <=34 U/L Final    ALT 12/29/2023 13  10 - 49 U/L Final    Alkaline Phosphatase 12/29/2023 56  46 - 116 U/L Final    WBC 12/29/2023 5.4  3.6 - 11.2 10*9/L Final    RBC 12/29/2023 4.16  3.95 - 5.13 10*12/L Final    HGB 12/29/2023 10.9 (L)  11.3 - 14.9 g/dL Final    HCT 89/78/7974 34.1  34.0 - 44.0 % Final    MCV 12/29/2023 82.0  77.6 - 95.7 fL Final    MCH 12/29/2023 26.2  25.9 - 32.4 pg Final    MCHC 12/29/2023 31.9 (L)  32.0 - 36.0 g/dL Final    RDW 89/78/7974 19.5 (H)  12.2 - 15.2 % Final    MPV 12/29/2023 9.2  6.8 - 10.7 fL Final    Platelet 12/29/2023 319  150 - 450 10*9/L Final Neutrophils % 12/29/2023 57.5  % Final    Lymphocytes % 12/29/2023 30.1  % Final    Monocytes % 12/29/2023 10.1  % Final    Eosinophils % 12/29/2023 1.7  % Final    Basophils % 12/29/2023 0.6  % Final    Absolute Neutrophils 12/29/2023 3.1  1.8 - 7.8 10*9/L Final    Absolute Lymphocytes 12/29/2023 1.6  1.1 - 3.6 10*9/L Final    Absolute Monocytes 12/29/2023 0.5  0.3 - 0.8 10*9/L Final    Absolute Eosinophils 12/29/2023 0.1  0.0 - 0.5 10*9/L Final    Absolute Basophils 12/29/2023 0.0  0.0 - 0.1 10*9/L Final    Anisocytosis 12/29/2023 Moderate (A)  Not Present Final    Hypochromasia 12/29/2023 Slight (A)  Not Present Final

## 2023-12-29 NOTE — Unmapped (Signed)
 Patient arrived ambulatory with mother for IVF. Port  accessed prior to arrival;. + blood return. Patient tolerated infusion well.  Port  [place] accessed,, + blood return, [place] locked,, and Heparin  locked, AVS declined Discharged ambulatory to home.

## 2023-12-30 DIAGNOSIS — D48119 Desmoid tumor: Principal | ICD-10-CM

## 2024-01-01 ENCOUNTER — Inpatient Hospital Stay: Admit: 2024-01-01 | Discharge: 2024-01-01 | Payer: BLUE CROSS/BLUE SHIELD

## 2024-01-04 DIAGNOSIS — D48119 Desmoid tumor: Principal | ICD-10-CM

## 2024-01-05 NOTE — Progress Notes (Signed)
 This pharmacist was notified by a technician that this patient has over 14 day supply of medication on hand. I have reviewed the patient's medical record and have determined that no further pharmacist action is needed.  Per provider note from 12/29/23: continue to hold nirogacestat  until diarrhea is under better control (< 10 bowel movements / day)       Approximate time spent: 0-5 minutes    Aneita DELENA [employer], [facility], Clinical Specialty Pharmacist  [facility]

## 2024-01-11 DIAGNOSIS — D48119 Desmoid tumor: Principal | ICD-10-CM

## 2024-01-11 NOTE — Telephone Encounter (Signed)
 Copied from CRM #[id number]. Topic: Care Management - General Questions  >> Jan 11, 2024  8:31 AM Harlene NOVAK wrote:  Erskin Crank contacted the Communication Center regarding the following:    - is calling to get the infusion for tomorrow moved to Friday due to pts schedule with school, would also like to get the infusion on 11/18 moved to 11/21    Please contact Crank at [phone].    Thanks in advance,    Harlene Stager  [facility]   [phone]

## 2024-01-12 DIAGNOSIS — D48119 Desmoid tumor: Principal | ICD-10-CM

## 2024-01-12 NOTE — Telephone Encounter (Signed)
 Copied from CRM #[id number]. Topic: Care Management - Discuss Care Plan  >> Jan 12, 2024 12:36 PM Nanetta SAILOR wrote:      Hi,     [name](mother) contacted the Communication Center requesting to speak with the care team of Riely Oetken to discuss:    Her daughter Agape to get her infusion that is scheduled for today 11/4 that needs to be cancelled and she needs that  moved to Friday 11/7 and then the appts that are scheduled for 11/21 she would like for the infusion appt to be scheduled for the same day as those appts along w/ the lab and Dr. Loris appt but the infusion thereafter. [name] states she is in school and she doesn't want to miss many days from school. She states she needs the fluid. Please get this changed for them. She states she will not be here today.     Please contact [name] back  at [phone].    Thank you,   Nanetta CHRISTELLA Seeds  [facility] Communication [facility]   [phone]

## 2024-01-15 MED ORDER — OXYCODONE 10 MG TABLET
ORAL_TABLET | Freq: Three times a day (TID) | ORAL | 0 refills | 30.00000 days | Status: CP | PRN
Start: 2024-01-15 — End: ?

## 2024-01-15 MED ORDER — OXYCODONE 20 MG TABLET
ORAL_TABLET | Freq: Four times a day (QID) | ORAL | 0 refills | 23.00000 days | PRN
Start: 2024-01-15 — End: ?

## 2024-01-18 DIAGNOSIS — D48119 Desmoid tumor: Principal | ICD-10-CM

## 2024-01-18 NOTE — Progress Notes (Signed)
 [name] [name] has been contacted in regards to their refill of [place]. At this time, they have declined refill due to medication being on hold. Refill assessment call date has been updated per the patient's request.

## 2024-01-27 ENCOUNTER — Ambulatory Visit: Admit: 2024-01-27 | Payer: BLUE CROSS/BLUE SHIELD

## 2024-01-27 ENCOUNTER — Encounter: Admit: 2024-01-27 | Payer: BLUE CROSS/BLUE SHIELD | Attending: Anesthesiology

## 2024-01-27 ENCOUNTER — Inpatient Hospital Stay
Admission: EM | Admit: 2024-01-27 | Discharge: 2024-02-26 | Disposition: A | Discharge status: Home / Self Care | Payer: Medicaid (Managed Care) | Admitting: Internal Medicine

## 2024-01-27 ENCOUNTER — Ambulatory Visit
Admission: EM | Admit: 2024-01-27 | Discharge: 2024-02-26 | Disposition: A | Discharge status: Home / Self Care | Payer: Medicaid (Managed Care) | Admitting: Internal Medicine

## 2024-01-27 ENCOUNTER — Encounter: Admit: 2024-01-27 | Payer: BLUE CROSS/BLUE SHIELD | Attending: Emergency Medicine

## 2024-01-27 ENCOUNTER — Inpatient Hospital Stay: Admit: 2024-01-27 | Discharge: 2024-01-29 | Payer: BLUE CROSS/BLUE SHIELD

## 2024-01-27 LAB — COMPREHENSIVE METABOLIC PANEL
ALBUMIN: 3.4 g/dL (ref 3.4–5.0)
ALKALINE PHOSPHATASE: 64 U/L (ref 46–116)
ALT (SGPT): 13 U/L (ref 10–49)
ANION GAP: 13 mmol/L (ref 5–14)
AST (SGOT): 13 U/L (ref <=34)
BILIRUBIN TOTAL: <0.2 mg/dL — ABNORMAL LOW (ref 0.3–1.2)
BLOOD UREA NITROGEN: 10 mg/dL (ref 9–23)
BUN / CREAT RATIO: 19
CALCIUM: 9.1 mg/dL (ref 8.7–10.4)
CHLORIDE: 113 mmol/L — ABNORMAL HIGH (ref 98–107)
CO2: 27.6 mmol/L (ref 20.0–31.0)
CREATININE: 0.54 mg/dL — ABNORMAL LOW (ref 0.55–1.02)
EGFR CKD-EPI (2021) FEMALE: >90 mL/min/1.73m2 (ref >=60)
GLUCOSE RANDOM: 93 mg/dL (ref 70–179)
POTASSIUM: 4.3 mmol/L (ref 3.4–4.8)
PROTEIN TOTAL: 6 g/dL (ref 5.7–8.2)
SODIUM: 154 mmol/L — ABNORMAL HIGH (ref 135–145)

## 2024-01-27 LAB — URINALYSIS WITH MICROSCOPY WITH CULTURE REFLEX PERFORMABLE
BACTERIA: NONE SEEN /HPF
BILIRUBIN UA: NEGATIVE
GLUCOSE UA: NEGATIVE
KETONES UA: NEGATIVE
LEUKOCYTE ESTERASE UA: NEGATIVE
NITRITE UA: NEGATIVE
PH UA: 7 (ref 5.0–9.0)
PROTEIN UA: NEGATIVE
RBC UA: <1 /HPF (ref <=4)
SPECIFIC GRAVITY UA: 1.011 (ref 1.003–1.030)
SQUAMOUS EPITHELIAL: 2 /HPF (ref 0–5)
UROBILINOGEN UA: <2
WBC UA: 1 /HPF (ref 0–5)

## 2024-01-27 LAB — IRON PANEL
IRON SATURATION (CALC): 8 %
IRON: 20 ug/dL — ABNORMAL LOW (ref 50–170)
TOTAL IRON BINDING CAPACITY (CALC): 238.1 ug/dL — ABNORMAL LOW (ref 250.0–425.0)
TRANSFERRIN: 189 mg/dL — ABNORMAL LOW (ref 250.0–380.0)

## 2024-01-27 LAB — CBC W/ AUTO DIFF
BASOPHILS ABSOLUTE COUNT: 0 10*9/L (ref 0.0–0.1)
BASOPHILS RELATIVE PERCENT: 0.4 %
EOSINOPHILS ABSOLUTE COUNT: 0.1 10*9/L (ref 0.0–0.5)
EOSINOPHILS RELATIVE PERCENT: 1.6 %
HEMATOCRIT: 30.6 % — ABNORMAL LOW (ref 34.0–44.0)
HEMOGLOBIN: 10.3 g/dL — ABNORMAL LOW (ref 11.3–14.9)
LYMPHOCYTES ABSOLUTE COUNT: 1.9 10*9/L (ref 1.1–3.6)
LYMPHOCYTES RELATIVE PERCENT: 32.9 %
MEAN CORPUSCULAR HEMOGLOBIN CONC: 33.6 g/dL (ref 32.0–36.0)
MEAN CORPUSCULAR HEMOGLOBIN: 26.9 pg (ref 25.9–32.4)
MEAN CORPUSCULAR VOLUME: 80.2 fL (ref 77.6–95.7)
MEAN PLATELET VOLUME: 9.2 fL (ref 6.8–10.7)
MONOCYTES ABSOLUTE COUNT: 0.6 10*9/L (ref 0.3–0.8)
MONOCYTES RELATIVE PERCENT: 10.2 %
NEUTROPHILS ABSOLUTE COUNT: 3.2 10*9/L (ref 1.8–7.8)
NEUTROPHILS RELATIVE PERCENT: 54.9 %
NUCLEATED RED BLOOD CELLS: 0 /100{WBCs} (ref <=4)
PLATELET COUNT: 248 10*9/L (ref 150–450)
RED BLOOD CELL COUNT: 3.82 10*12/L — ABNORMAL LOW (ref 3.95–5.13)
RED CELL DISTRIBUTION WIDTH: 16.2 % — ABNORMAL HIGH (ref 12.2–15.2)
WBC ADJUSTED: 5.9 10*9/L (ref 3.6–11.2)

## 2024-01-27 LAB — PREGNANCY, URINE: PREGNANCY TEST URINE: NEGATIVE

## 2024-01-27 LAB — FERRITIN: FERRITIN: 11.5 ng/mL (ref 7.3–270.7)

## 2024-01-27 LAB — LIPASE: LIPASE: 31 U/L (ref 12–53)

## 2024-01-27 MED ADMIN — HYDROmorphone (PF) (DILAUDID) injection 1 mg: 1 mg | INTRAVENOUS | @ 22:00:00 | Stop: 2024-01-27

## 2024-01-27 MED ADMIN — iohexol (OMNIPAQUE) 350 mg iodine/mL solution 100 mL: 100 mL | INTRAVENOUS | @ 23:00:00 | Stop: 2024-01-27

## 2024-01-27 MED ADMIN — ondansetron (ZOFRAN) injection 4 mg: 4 mg | INTRAVENOUS | @ 22:00:00 | Stop: 2024-01-27

## 2024-01-27 MED ADMIN — lactated ringers bolus 1,000 mL: 1000 mL | INTRAVENOUS | @ 22:00:00 | Stop: 2024-01-27

## 2024-01-27 NOTE — ED Provider Notes (Signed)
 ED Attending Note  [facility] Emergency Department   I saw the patient on 01/27/2024.      Patient Name: [name] [name]  Chief Complaint: Blood in Stool        Patient Name: [name] [name]  Chief Complaint: Blood in Stool            ED Clinical Impression     Final diagnoses:   Hypernatremia (Primary)   Gastrointestinal hemorrhage, unspecified gastrointestinal hemorrhage type   Abdominal pain, unspecified abdominal location           Medical Decision Making, Impression, ED Course, Assessment and Plan       -I have reviewed the outside/prior medical records including 12/16/2023- Progress Note- For review of the patient's PMH  -Imaging reviewed and interpreted by me showed CT abdomen without evidence of bowel obstruction, and is deception but is stable appearance of desmoid tumors and evidence of inflammation at the ileoanal anastomosis c/f pouchitis  -Laboratory workup reviewed by me notable for hyponatremia, hemoglobin of 10.3, iron  level of 20,  -Spoke with Dr. Roark of gastroenterology regarding care plan  -Patient/caregiver discussions: patient and her mother  -Social Determinants of health which significantly affected care: NA  -Indications for observation/admission (or consideration of observation/admission) and/or appropriateness for outpatient management: given symptoms that have been worsening despite outpatient management with        [name] [name] is a 23 y.o. female with a past medical history of DVT (RUE, s/p Eliquis  x3 months), [name] syndrome s/p colectomy, desmoid tumor (including of the mesentery), anemia, and severe protein-calorie malnutrition presenting to the ED for 2 weeks of worsening intermittent hematochezia (BRB), several syncopal episodes, a lack of appetite with pain while eating, and abdominal pain.    On exam the patient is non-toxic appearing and in NAD. Vital signs are within normal limits, hemodynamically stable, and afebrile. LCTAB. HRRR. Physical exam is most significant for R chest port site, without redness, drainage, or tenderness. Epigastric tenderness to palpation.     Differential diagnosis is broad. It sounds like she has been dealing with chronic G.I. bleeding, which has been attributed to ulcerations and inflammation in her pouch, though bleeding source is not always been identified. Her workup has been complicated by concerns that due to her desmoid tumors, such as video capsule endoscopy may not be safe. She is currently undergoing evaluation for a double-balloon enteroscopy w/ side viewing scope at [facility] to better evaluate this. She could also be experiencing complications including pouchitis, small bowel obstructions and intussusception the pouch, which are considerations today given worsening of her pain. Would also consider possibility of upper G.I. bleed, though in the setting of stable hemoglobin, feel that this is less likely.    Will obtain laboratory workup, and she is requesting that iron  and ferritin be sent today which I have added. Will obtain CT to evaluate for bowel obstruction, and deception or other possible surgical process could be contributing to her worsening pain.    ED Course:   ED Course as of 01/27/24 2336   Wed Jan 27, 2024   2020 Spoke with Dr. Roark from gastroenterology team and from their standpoint would be appropriate to remain at [facility] for GI consult here.      CT imaging without evidence of surgical process. Does have similar appearance of desmoid tumors and inflammation at the ileoanal anastomosis which is concerning for pouch -itis. Given hypernatremia and dehydration, worsening pain, increased bleeding, will recommend admission  for further workup and care.     History   History obtained from: Patient and mom at bedside.     [name] [name] is a 23 y.o. female with a past medical history of DVT (RUE, s/p Eliquis  x3 months), [name] syndrome s/p proctocolectomy with ileoanal anastomosis, desmoid tumor, anemia (requiring intermittent IV iron  infusions), chronic pain and visceral hyperalgesia, and severe protein-calorie malnutrition presenting to the ED for hematochezia. The patient reports 2 weeks of worsening intermittent hematochezia (BRB), several syncopal episodes, a lack of appetite with pain while eating, and abdominal pain. Upon evaluation, she endorses nausea, pelvic pain, bowel urgency, flank pain, and subjective fevers. She reports that she has a history of syncopal episodes, starting May 2025. Per chart review, it was noted on 12/02/2023 that her syncopal episodes are correlated with fluid status, hypotension, and anemia, which the patient notes is secondary to GI bleeds. She notes an average of 7-10 BM/day, in the setting of a colectomy. She states that she started taking imodium  for her symptoms. Further, she reports that she recently started classes again, and believes that her symptoms may be due to pushing herself.     She reports that she has experienced symptoms of chronic abdominal pain, GI bleeding and syncopal events when the symptoms were worse, but over the last week and particularly the last few days, her symptoms have been worsening to the point that she is not able to manage them at home.    Mom at bedside reports that she had 4 iron  infusions this year. She endorses the use of 6-10 mg of oxycodone  per day, with a maximum dose of 20 mg. Further, mom states that she took 6 mg of Zofran  at 6:00 AM this morning. She endorses a history of hematochezia. Family history of colon cancer. Denies emesis or urinary symptoms.     I reviewed hem onc note from 10/21, she is currently not on treatment (nirogacestat  is held) and sorafenib  not advised at this time. Has chronic bloody diarrhea for which she is undergoing evaluation for double balloon endoscopy  at [facility] and side-viewing upper endoscopy exam. Was seen at [facility] on 8/20; has history of chronic GI bleeding that has typically been attributed to inflammatory changes in distal neo-ileum or oozing from ulceration around ileoanal anastomosis.     Past Medical/Past Surgical History:   Reviewed in EHR including nursing documentation as outlined. Pertinent PMH/PSH also noted above in HPI.   Past Medical History[1]  Problem List[2]  Past Surgical History[3]    Social History/Family History:   Reviewed in EMR, I agree with nursing documentation. Additional pertinent social and family history noted in HPI.   Short Social History[4]  Family History[5]    ROS  A review of systems reviewed and are negative except as stated in the HPI or noted below.     Home Medications:    Current Facility-Administered Medications:     butalbital -acetaminophen -caffeine  (ESGIC ) per tablet 1 tablet, 1 tablet, Oral, BID PRN, Beverley Leita RAMAN, MD, 1 tablet at 01/27/24 2058    HYDROmorphone  (PF) (DILAUDID ) injection 1 mg, 1 mg, Intravenous, Q4H PRN, Beverley Leita RAMAN, MD, 1 mg at 01/27/24 1947    oxyCODONE  (ROXICODONE ) 5 mg/5 mL solution 15 mg, 15 mg, Oral, Q4H PRN, Beverley Leita RAMAN, MD, 15 mg at 01/27/24 2102    promethazine  (PHENERGAN ) tablet 25 mg, 25 mg, Oral, Q6H PRN, Beverley Leita RAMAN, MD    Current Outpatient Medications:     acetaminophen  (TYLENOL   EXTRA STRENGTH) 500 MG tablet, Take 2 tablets (1,000 mg total) by mouth every eight (8) hours., Disp: , Rfl:     ammonium lactate  (LAC-HYDRIN ) 12 % lotion, Apply 1 Application topically every hour as needed., Disp: , Rfl:     baclofen  (LIORESAL ) 10 MG tablet, Take 0.5 tablets (5 mg total) by mouth Three (3) times a day., Disp: 45 tablet, Rfl: 2    buprenorphine  20 mcg/hour PTWK transdermal patch, Place 1 patch on the skin every seven (7) days., Disp: 4 patch, Rfl: 2    butalbital -acetaminophen -caffeine  (ESGIC ) 50-325-40 mg per tablet, Take 1 tablet by mouth two (2) times a day as needed for headache., Disp: 20 tablet, Rfl: 0    cetirizine  (ZYRTEC ) 10 MG tablet, Take 1 tablet (10 mg total) by mouth two (2) times a day. After iron  infusions, Disp: , Rfl:     cetirizine  (ZYRTEC ) 10 MG tablet, Take 2 tablets (20 mg total) by mouth two (2) times a day. Take starting 2 days before IV iron  and for 5 days after, Disp: 28 tablet, Rfl: 0    diphenoxylate-atropine (LOMOTIL) 2.5-0.025 mg per tablet, Take 1 tablet by mouth four (4) times a day as needed for diarrhea., Disp: 30 tablet, Rfl: 1    escitalopram  oxalate (LEXAPRO ) 5 MG tablet, Take 1 tablet (5 mg total) by mouth daily., Disp: 30 tablet, Rfl: 2    famotidine  (PEPCID ) 20 MG tablet, Take 1 tablet (20 mg total) by mouth nightly., Disp: 30 tablet, Rfl: 3    fluticasone  propionate (FLONASE ) 50 mcg/actuation nasal spray, Use 1 spray under the butrans  patch once every 7 days to prevent allergic reaction to the adhesive, Disp: , Rfl:     hydrocortisone  (ANUSOL -HC) 2.5 % rectal cream, AS DIRECTED, Disp: , Rfl:     hydrocortisone  (CORTEF ) 10 MG tablet, Take 1 tablet (10 mg total) by mouth every morning., Disp: 90 tablet, Rfl: 1    lidocaine  (ASPERCREME) 4 % patch, Place 1 patch on the skin daily. Apply for 12 hours then remove for 12 hours., Disp: 30 patch, Rfl: 0    lidocaine  (XYLOCAINE ) 2 % Soln, Mix with Children's Benadryl  and Maalox. Swish and spit every 4 hours as needed for mouth pain, Disp: 100 mL, Rfl: 1    lidocaine  (XYLOCAINE ) 5 % ointment, Apply to affected area daily, Disp: 37 g, Rfl: 3    loperamide  (IMODIUM ) 2 mg capsule, Take 1 capsule (2 mg total) by mouth four (4) times a day as needed., Disp: 100 capsule, Rfl: 3    LORazepam  (ATIVAN ) 0.5 MG tablet, Take 1 tablet (0.5 mg total) by mouth two (2) times a day as needed for anxiety., Disp: 10 tablet, Rfl: 0    naloxegol  (MOVANTIK ) 12.5 mg tablet, Take 1 tablet (12.5 mg total) by mouth daily as needed (constipation)., Disp: , Rfl:     naloxone  (NARCAN ) 4 mg nasal spray, One spray in either nostril once for known/suspected opioid overdose. May repeat every 2-3 minutes in alternating nostril til EMS arrives, Disp: 1 each, Rfl: 0    [Paused] nirogacestat  (OGSIVEO ) 100 mg tablet, Take 1 tablet (100 mg total) by mouth two (2) times a day. Swallow tablets whole; do not break, crush, or chew., Disp: 60 tablet, Rfl: 5    ondansetron  (ZOFRAN -ODT) 8 MG disintegrating tablet, Dissolve 1 tablet (8 mg total) in the mouth every eight (8) hours as needed for nausea., Disp: 30 tablet, Rfl: 2    oxyCODONE  (ROXICODONE ) 10 mg immediate release tablet,  Take 2 tablets (20 mg total) by mouth every eight (8) hours as needed for pain. DNF: 01/15/24, Disp: 180 tablet, Rfl: 0    oxyCODONE  (ROXICODONE ) 20 mg immediate release tablet, Take 1 tablet (20 mg total) by mouth every six (6) hours as needed for pain. DNF: 12/16/23, Disp: 120 tablet, Rfl: 0    pantoprazole  (PROTONIX ) 40 MG tablet, Take 1 tablet (40 mg total) by mouth Two (2) times a day (30 minutes before a meal)., Disp: 60 tablet, Rfl: 0    predniSONE  (DELTASONE ) 50 MG tablet, Take 1 tablet (50 mg total) by mouth two (2) times a day. Take 13 and 7 hours prior to iron  infusion., Disp: 2 tablet, Rfl: 0    promethazine  (PHENERGAN ) 12.5 MG tablet, Take 1 tablet (12.5 mg total) by mouth every six (6) hours as needed., Disp: 20 tablet, Rfl: 0    scopolamine  (TRANSDERM-SCOP) 1 mg over 3 days, Place 1 patch (1 mg total) on the skin every third day., Disp: 10 patch, Rfl: 0    sucralfate  (CARAFATE ) 100 mg/mL suspension, Take 10 mL (1 g total) by mouth four (4) times a day., Disp: , Rfl:     topiramate  (TOPAMAX ) 25 MG capsule, Take 1 capsule (25 mg total) by mouth two (2) times a day., Disp: 60 capsule, Rfl: 2    tranexamic acid  650 mg Tab tablet, Take 2 tablets (1,300 mg total) by mouth Three (3) times a day as needed., Disp: 180 tablet, Rfl: 0    ALLERGIES:   Adhesive tape-silicones; Ferrlecit  [sodium ferric gluconat-sucrose]; Levofloxacin ; Methylnaltrexone ; Neomycin; Papaya; Morphine; Zosyn [piperacillin-tazobactam]; Adhesive; Compazine [prochlorperazine]; Iron  analogues; Metoclopramide ; Metronidazole ; Reglan  [metoclopramide  hcl]; Iron  dextran; and Latex, natural rubber       Physical Exam     ED Triage Vitals   Enc Vitals Group      BP 01/27/24 1344 124/63      Pulse 01/27/24 1344 110      SpO2 Pulse 01/27/24 1500 82      Resp 01/27/24 1344 16      Temp 01/27/24 1340 36.5 ??C (97.7 ??F)      Temp Source 01/27/24 1340 Oral      SpO2 01/27/24 1344 99 %      Weight 01/27/24 1340 57.5 kg (126 lb 11.2 oz)     Vital signs reviewed.  GENERAL: well-appearing in no acute distress,    HEENT: airway is patent, moist mucus membranes, oropharynx clear without lesions, exudates, thrush  EYES: pupils equal and reactive to light bilaterally, no scleral icterus, conjunctivae clear  NECK: trachea midline, no cervical lymphadenopathy  CARDIAC: normal rate, regular rhythm, normal S1 and S2, no murmurs, 2+ peripheral pulses, normal capillary refill  PULMONARY: normal work of breathing on, good air movement bilaterally, lungs are clear to auscultation bilaterally without wheezes, crackles, rhonchi  ABDOMINAL: normal bowel sounds, abdomen soft, nondistended, no hepatomegaly or splenomegaly. Epigastric tenderness to palpation.   MSK: no joint deformity or swelling, moves all 4 extremities without difficulty, no lower extremity edema. R chest port site without redness, drainage, or tenderness over the chest wall.   NEURO: alert and oriented ??3, normal mental status, PERRL, EOMI, 5/5 strength in all 4 extremities, no focal sensory deficits to light touch in distal extremities  SKIN: Warm and dry without rashes  PSYCH: Mood and affect are normal        Labs and Radiology     Labs Reviewed   COMPREHENSIVE METABOLIC PANEL - Abnormal; Notable for the following  components:       Result Value    Sodium 154 (*)     Chloride 113 (*)     Creatinine 0.54 (*)     Total Bilirubin <0.2 (*)     All other components within normal limits   IRON  PANEL - Abnormal; Notable for the following components:    Iron  20 (*)     TIBC 238.1 (*)     Transferrin 189.0 (*)     All other components within normal limits   CBC W/ AUTO DIFF - Abnormal; Notable for the following components:    RBC 3.82 (*)     HGB 10.3 (*)     HCT 30.6 (*)     RDW 16.2 (*)     All other components within normal limits   URINALYSIS WITH MICROSCOPY WITH CULTURE REFLEX PERFORMABLE - Abnormal; Notable for the following components:    Blood, UA Small (*)     Mucus, UA Rare (*)     All other components within normal limits   LIPASE  - Normal   PREGNANCY, URINE - Normal   FERRITIN - Normal   CBC W/ DIFFERENTIAL    Narrative:     The following orders were created for panel order CBC w/ Differential.                  Procedure                               Abnormality         Status                                     ---------                               -----------         ------                                     CBC w/ Differential[[id number]]         Abnormal            Final result                                                 Please view results for these tests on the individual orders.   URINALYSIS WITH MICROSCOPY WITH CULTURE REFLEX    Narrative:     The following orders were created for panel order Urinalysis with Microscopy with Culture Reflex.                  Procedure                               Abnormality         Status                                     ---------                               -----------         ------  Urinalysis with Microsc.SABRASABRA[[id number]]  Abnormal            Final result                                                 Please view results for these tests on the individual orders.   PROTIME-INR   TYPE AND SCREEN WITH ABORH CONFIRMATION     CT Abdomen Pelvis W IV Contrast Only   Final Result   No adverse interval change. Similar appearance of mild inflammation about the ileoanal anastomosis as can be seen with pouchitis. No bowel obstruction.      -- Similar size of enhancing soft tissue mass in the central mesentery measuring up to 3.3 cm, favored to represent a desmoid tumor.      -- Stable size of enhancing right sided rectus abdominis mass, measuring up to 2.5 cm. Additional soft tissue nodules along the left flank and bilateral gluteal regions are overall similar in size to prior.          I, Adine Caraway, MD, have personally reviewed the images and concur with the resident preliminary report above. This report now represents the final report for this patient.                      Procedures   Procedures        Portions of this record have been created using [employer]. Dictation errors have been sought, but may not have been identified and corrected.     Documentation assistance was provided by Vernell Derry, Scribe on January 27, 2024 at 4:20 PM for Leita Chancy, MD.      Documentation assistance was provided by the scribe in my presence.  The documentation recorded by the scribe has been reviewed by me and accurately reflects the services I personally performed.             [1]   Past Medical History:  Diagnosis Date    Abdominal pain     Acid reflux     occas    Anesthesia complication     itching, shaking, coldness; last few surgeries have gone much better    Cancer    (CMS-HCC)     Cataract of right eye     COVID-19 virus infection 01/2019    Cyst of thyroid determined by ultrasound     monitoring    [name]     2 right forearm, 1 left thigh, 1 right scapula, 1 under left clavicle; multiple    Difficult intravenous access     [name] (familial adenomatous polyposis)     [name] (HHS-HCC)     Gastric polyps     History of chemotherapy     last treatment approx 05/2019    History of colon polyps     History of COVID-19 01/2019    Ileus    (CMS-HCC) 03/16/2022    Iron  deficiency anemia due to chronic blood loss     received iron  infusion 11-2019    PONV (postoperative nausea and vomiting)     Rectal bleeding     Syncopal episodes     especially if becoming dehydrated   [2]   Patient Active Problem List  Diagnosis    [name] (familial adenomatous polyposis)    Intestinal polyps    Desmoid  tumor    Benign neoplasm of skin of trunk    Other benign neoplasm of connective and other soft tissue of upper limb, including shoulder    History of colonic polyps    [name] of skin    Thyroid cyst    Syncope    Chemotherapy-induced nausea    Cancer associated pain    Iron  deficiency anemia due to chronic blood loss    Dehydration    Physical deconditioning    History of colectomy    Acute deep vein thrombosis (DVT) of axillary vein of right upper extremity (CMS-HCC)    Severe protein-calorie malnutrition (HHS-HCC)    Winged scapula of right side    Lower abdominal pain    Intractable abdominal pain    Generalized anxiety disorder with panic attacks    Hyperglycemia    Hypoglycemia    SBO (small bowel obstruction) (CMS-HCC)    Moderate episode of recurrent major depressive disorder (CMS-HCC)    Small intestinal bacterial overgrowth (SIBO)    Hypersensitivity reaction    Anemia    On total parenteral nutrition (TPN)    Urinary retention    High risk medication use    Other acute postprocedural pain    Pain medication agreement signed    Insomnia    Iron  deficiency    Dizziness    Fatigue    Pouchitis    (CMS-HCC)    Elevated liver transaminase level    Positive blood culture    C. difficile diarrhea    Nasogastric tube present    Photophobia    Chronic migraine with aura without status migrainosus, not intractable    Hypercoagulable state (HHS-HCC)    Anxiety and depression    GI bleed    Itching    Gastrostomy status    (CMS-HCC)   [3]   Past Surgical History:  Procedure Laterality Date    COLON SURGERY      cyroablation      cystis removal      desmoid removal      IR INSERT PORT AGE GREATER THAN 5 YRS  07/20/2023    IR INSERT PORT AGE GREATER THAN 5 YRS 07/20/2023 [place] Hicks, MD IMG VIR H&V [facility]    PR CLOSE ENTEROSTOMY,RESEC+ANAST N/A 10/09/2020    Procedure: ILEOSTOMY TAKEDOWN;  Surgeon: Evalene Karalee Shilling, MD;  Location: OR [place];  Service: General Surgery PR COLONOSCOPY W/BIOPSY SINGLE/MULTIPLE N/A 10/27/2012    Procedure: COLONOSCOPY, FLEXIBLE, PROXIMAL TO SPLENIC FLEXURE; WITH BIOPSY, SINGLE OR MULTIPLE;  Surgeon: Annalee LABOR Mir, MD;  Location: PEDS PROCEDURE ROOM [facility];  Service: Gastroenterology    PR COLONOSCOPY W/BIOPSY SINGLE/MULTIPLE N/A 09/14/2013    Procedure: COLONOSCOPY, FLEXIBLE, PROXIMAL TO SPLENIC FLEXURE; WITH BIOPSY, SINGLE OR MULTIPLE;  Surgeon: Annalee LABOR Mir, MD;  Location: PEDS PROCEDURE ROOM [facility];  Service: Gastroenterology    PR COLONOSCOPY W/BIOPSY SINGLE/MULTIPLE N/A 11/08/2014    Procedure: COLONOSCOPY, FLEXIBLE, PROXIMAL TO SPLENIC FLEXURE; WITH BIOPSY, SINGLE OR MULTIPLE;  Surgeon: Annalee Dine Mir, MD;  Location: PEDS PROCEDURE ROOM [facility];  Service: Gastroenterology    PR COLONOSCOPY W/BIOPSY SINGLE/MULTIPLE N/A 12/26/2015    Procedure: COLONOSCOPY, FLEXIBLE, PROXIMAL TO SPLENIC FLEXURE; WITH BIOPSY, SINGLE OR MULTIPLE;  Surgeon: Annalee Dine Mir, MD;  Location: PEDS PROCEDURE ROOM [facility];  Service: Gastroenterology    PR COLONOSCOPY W/BIOPSY SINGLE/MULTIPLE N/A 09/02/2017    Procedure: COLONOSCOPY, FLEXIBLE, PROXIMAL TO SPLENIC FLEXURE; WITH BIOPSY, SINGLE OR MULTIPLE;  Surgeon: Annalee Dine Mir, MD;  Location: PEDS PROCEDURE ROOM  [facility];  Service: Gastroenterology    PR COLSC FLX W/REMOVAL LESION BY HOT BX FORCEPS N/A 08/27/2016    Procedure: COLONOSCOPY, FLEXIBLE, PROXIMAL TO SPLENIC FLEXURE; W/REMOVAL TUMOR/POLYP/OTHER LESION, HOT BX FORCEP/CAUTE;  Surgeon: Annalee Dine Mir, MD;  Location: PEDS PROCEDURE ROOM [facility];  Service: Gastroenterology    PR COLSC FLX W/RMVL OF TUMOR POLYP LESION SNARE TQ N/A 02/25/2019    Procedure: COLONOSCOPY FLEX; W/REMOV TUMOR/LES BY SNARE;  Surgeon: Lamar Jayson Ards, MD;  Location: GI PROCEDURES [place] [facility];  Service: Gastroenterology    PR COLSC FLX W/RMVL OF TUMOR POLYP LESION SNARE TQ N/A 03/13/2020    Procedure: COLONOSCOPY FLEX; W/REMOV TUMOR/LES BY SNARE;  Surgeon: Lamar Jayson Ards, MD; Location: GI PROCEDURES [place] [facility];  Service: Gastroenterology    PR ENDOSCOPY UPPER SMALL INTESTINE N/A 07/15/2023    Procedure: SMALL INTESTINAL ENDOSCOPY, ENTEROSCOPY BEYOND SECOND PORTION OF DUODENUM, NOT INCL ILEUM; DX, INCL COLLECTION OF SPECIMEN(S) BY BRUSHING OR WASHING, WHEN PERFORMED;  Surgeon: Charlanne Kipper, MD;  Location: [facility];  Service: Gastrointestinal    PR EXC SKIN BENIG 2.1-3 CM TRUNK,ARM,LEG Right 02/25/2017    Procedure: EXCISION, BENIGN LESION INCLUDE MARGINS, EXCEPT SKIN TAG, LEGS; EXCISED DIAMETER 2.1 TO 3.0 CM;  Surgeon: Jeyhan Suzan Wood, MD;  Location: CHILDRENS OR [facility];  Service: Plastics    PR EXC SKIN BENIG 3.1-4 CM TRUNK,ARM,LEG Right 02/25/2017    Procedure: EXCISION, BENIGN LESION INCLUDE MARGINS, EXCEPT SKIN TAG, ARMS; EXCISED DIAMETER 3.1 TO 4.0 CM;  Surgeon: Jeyhan Suzan Wood, MD;  Location: CHILDRENS OR [facility];  Service: Plastics    PR EXC SKIN BENIG >4 CM FACE,FACIAL Right 02/25/2017    Procedure: EXCISION, OTHER BENIGN LES INCLUD MARGIN, FACE/EARS/EYELIDS/NOSE/LIPS/MUCOUS MEMBRANE; EXCISED DIAM >4.0 CM;  Surgeon: Jeyhan Suzan Wood, MD;  Location: CHILDRENS OR [facility];  Service: Plastics    PR EXC TUMOR SOFT TISSUE LEG/ANKLE SUBQ 3+CM Right 08/05/2019    Procedure: EXCISION, TUMOR, SOFT TISSUE OF LEG OR ANKLE AREA, SUBCUTANEOUS; 3 CM OR GREATER;  Surgeon: Delon Charmaine Myron, MD;  Location: MAIN OR [place];  Service: Plastics    PR EXC TUMOR SOFT TISSUE LEG/ANKLE SUBQ <3CM Right 08/05/2019    Procedure: EXCISION, TUMOR, SOFT TISSUE OF LEG OR ANKLE AREA, SUBCUTANEOUS; LESS THAN 3 CM;  Surgeon: Delon Charmaine Myron, MD;  Location: MAIN OR [facility];  Service: Plastics    PR LAP, SURG PROCTECTOMY W J-POUCH N/A 08/10/2020    Procedure: ROBOTIC ASSISTED LAPAROSCOPIC PROCTOCOLECTOMY, ILEAL J POUCH, WITH OSTOMY;  Surgeon: Evalene Karalee Shilling, MD;  Location: OR [place];  Service: General Surgery    PR NDSC EVAL INTSTINAL POUCH DX W/COLLJ SPEC SPX N/A 01/23/2021    Procedure: ENDO EVAL SM INTEST POUCH; DX;  Surgeon: Mikle Cleora Martinez, MD;  [facility];  Service: Gastroenterology    PR NDSC EVAL INTSTINAL POUCH DX W/COLLJ SPEC SPX N/A 08/27/2021    Procedure: ENDO EVAL SM INTEST POUCH; DX;  Surgeon: Rodgers JAYSON Minor, MD;  [facility];  Service: Gastroenterology    PR NDSC EVAL INTSTINAL POUCH DX W/COLLJ SPEC SPX N/A 12/09/2021    Procedure: ENDO EVAL SM INTEST POUCH; DX;  Surgeon: Sherrill Liliane Olmsted, MD;  [facility]: [facility];  Service: Gastroenterology    PR NDSC EVAL INTSTINAL POUCH DX W/COLLJ [name] SPX Left 04/09/2022    Procedure: ENDO EVAL SM INTEST POUCH; DX;  Surgeon: Martinez Mikle Cleora, MD;  Location[facility] [place];  Service: Gastroenterology    PR NDSC  EVAL INTSTINAL POUCH DX W/COLLJ SPEC SPX N/A 08/05/2022    Procedure: ENDO EVAL SM INTEST POUCH; DX;  Surgeon: Lucian Mikle Pfeiffer, MD;  Location: [facility];  Service: Gastroenterology    PR NDSC EVAL INTSTINAL POUCH DX W/COLLJ SPEC SPX N/A 03/13/2023    Procedure: ENDO EVAL SM INTEST POUCH; DX;  Surgeon: Rogerio Lauraine Sor, MD;  [facility];  Service: Gastroenterology    PR NDSC EVAL INTSTINAL POUCH DX W/COLLJ SPEC SPX N/A 07/06/2023    Procedure: ENDO EVAL SM INTEST POUCH; DX;  Surgeon: Junette Fonda CROME, MD;  [facility]: [facility];  Service: Gastroenterology    PR NDSC EVAL INTSTINAL POUCH DX W/COLLJ SPEC SPX N/A 10/14/2023    Procedure: ENDO EVAL SM INTEST POUCH; DX;  Surgeon: Brenita Vannie BIRCH, MD;  [facility]: [facility];  Service: Gastroenterology    PR NDSC EVAL INTSTINAL POUCH W/BX SINGLE/MULTIPLE N/A 01/20/2022    Procedure: ENDOSCOPIC EVAL OF SMALL INTESTINAL POUCH; DIAGNOSTIC, No biopsies;  Surgeon: Gwenn Dallas Ruth, MD;  [facility];  Service: Gastroenterology    PR NDSC EVAL INTSTINAL POUCH W/BX SINGLE/MULTIPLE N/A 02/13/2022 Procedure: ENDOSCOPIC EVAL OF SMALL INTESTINAL POUCH; DIAGNOSTIC, WITH BIOPSY;  Surgeon: [name] Arlean Min, MD;  Location: [facility];  Service: Gastroenterology    PR NDSC EVAL INTSTINAL POUCH W/BX SINGLE/MULTIPLE N/A 03/13/2023    Procedure: ENDOSCOPIC EVAL OF SMALL INTESTINAL POUCH; DIAGNOSTIC, WITH BIOPSY;  Surgeon: Rogerio Lauraine Sor, MD;  Location: [facility];  Service: Gastroenterology    PR UNLISTED PROCEDURE SMALL INTESTINE  01/23/2021    Procedure: UNLISTED PROCEDURE, SMALL INTESTINE;  Surgeon: Mikle Pfeiffer Lucian, MD;  Location: [facility] [place] [facility];  Service: Gastroenterology    PR UNLISTED PROCEDURE SMALL INTESTINE  02/13/2022    Procedure: UNLISTED PROCEDURE, SMALL INTESTINE;  Surgeon: [name] Arlean Min, MD;  Location: [facility];  Service: Gastroenterology    PR UPPER GI ENDOSCOPY,BIOPSY N/A 10/27/2012    Procedure: UGI ENDOSCOPY; WITH BIOPSY, SINGLE OR MULTIPLE;  Surgeon: Annalee LABOR Mir, MD;  Location: PEDS PROCEDURE ROOM [facility];  Service: Gastroenterology    PR UPPER GI ENDOSCOPY,BIOPSY N/A 09/14/2013    Procedure: UGI ENDOSCOPY; WITH BIOPSY, SINGLE OR MULTIPLE;  Surgeon: Annalee LABOR Mir, MD;  Location: PEDS PROCEDURE RO[facility];  Service: Gastroenterology    PR UPPER GI ENDOSCOPY,BIOPSY N/A 11/08/2014    Procedure: UGI ENDOSCOPY; WITH BIOPSY, SINGLE OR MULTIPLE;  Surgeon: Annalee Dine Mir, MD;  Location: PEDS PROCEDURE ROOM [facility];  Service: Gastroenterology    PR UPPER GI ENDOSCOPY,BIOPSY N/A 12/26/2015    Procedure: UGI ENDOSCOPY; WITH BIOPSY, SINGLE OR MULTIPLE;  Surgeon: Annalee Dine Mir, MD;  Location: PEDS PROCEDURE ROOM [facility];  Service: Gastroenterology    PR UPPER GI ENDOSCOPY,BIOPSY N/A 08/27/2016    Procedure: UGI ENDOSCOPY; WITH BIOPSY, SINGLE OR MULTIPLE;  Surgeon: Annalee Dine Mir, MD;  Location: PEDS PROCEDURE RO[facility];  Service: Gastroenterology    PR UPPER GI ENDOSCOPY,BIOPSY N/A 09/02/2017    Procedure: UGI ENDOSCOPY; WITH BIOPSY, SINGLE OR MULTIPLE;  Surgeon: Annalee Dine Mir, MD;  Location: PEDS PROCEDURE ROOM [facility];  Service: Gastroenterology    PR UPPER GI ENDOSCOPY,BIOPSY N/A 03/13/2020    Procedure: UGI ENDOSCOPY; WITH BIOPSY, SINGLE OR MULTIPLE;  Surgeon: Lamar Jayson Ards, MD;  Location: [facility];  Service: Gastroenterology    PR UPPER GI ENDOSCOPY,BIOPSY N/A 09/05/2021    Procedure: UGI ENDOSCOPY; WITH BIOPSY, SINGLE OR MULTIPLE;  Surgeon: Rosamond Never, MD;  Location: [facility];  Service: Gastroenterology    PR UPPER GI ENDOSCOPY,DIAGNOSIS N/A 01/20/2022    Procedure: UGI ENDO, INCLUDE ESOPHAGUS, STOMACH, & DUODENUM &/OR JEJUNUM; DX W/WO COLLECTION SPECIMN, BY BRUSH OR WASH;  Surgeon: Gwenn Dallas Ruth, MD;  Location: [facility];  Service: Gastroenterology    TUMOR REMOVAL      multiple-head, neck, back, hand, right flank, multiple   [4]   Social History  Tobacco Use    Smoking status: Never     Passive exposure: Past    Smokeless tobacco: Never   Vaping Use    Vaping status: Never Used   Substance Use Topics    Alcohol use: Never    Drug use: Never   [5]   Family History  Problem Relation Age of Onset    No Known Problems Mother     No Known Problems Father     No Known Problems Sister     No Known Problems Brother     Stroke Maternal Grandmother     Other Maternal Grandmother         benign lesions of liver and pancreas, further details unknown    Cancer Maternal Grandmother     Diabetes Maternal Grandmother     Hypertension Maternal Grandmother     Thyroid disease Maternal Grandmother     Arthritis Maternal Grandfather     Asthma Maternal Grandfather     COPD Paternal Grandmother         Deceased    Miscarriages / Stillbirths Paternal Grandmother     Alcohol abuse Paternal Grandfather         Deceased    No Known Problems Maternal Aunt     No Known Problems Maternal Uncle     No Known Problems Paternal Aunt     No Known Problems Paternal Uncle     Anesthesia problems Neg Hx     Broken bones Neg Hx     Cancer Neg Hx     Clotting disorder Neg Hx     Collagen disease Neg Hx     Diabetes Neg Hx     Dislocations Neg Hx     Fibromyalgia Neg Hx     Gout Neg Hx     Hemophilia Neg Hx     Osteoporosis Neg Hx     Rheumatologic disease Neg Hx     Scoliosis Neg Hx     Severe sprains Neg Hx     Sickle cell anemia Neg Hx     Spinal Compression Fracture Neg Hx     Melanoma Neg Hx     Basal cell carcinoma Neg Hx     Squamous cell carcinoma Neg Hx         Beverley Leita RAMAN, MD  01/27/24 [id number]

## 2024-01-27 NOTE — ED Triage Note (Addendum)
 Pt oncology pt. Pt ambulatory to triage with Rolling walker, C/O blood in stool x 2 weeks. Syncopal episodes. Pt has hx of GI bleeds, hx of obstruction. Pain while eating.

## 2024-01-28 LAB — COMPREHENSIVE METABOLIC PANEL
ALBUMIN: 3.1 g/dL — ABNORMAL LOW (ref 3.4–5.0)
ALKALINE PHOSPHATASE: 61 U/L (ref 46–116)
ALT (SGPT): 10 U/L (ref 10–49)
ANION GAP: 12 mmol/L (ref 5–14)
AST (SGOT): 14 U/L (ref <=34)
BILIRUBIN TOTAL: <0.2 mg/dL — ABNORMAL LOW (ref 0.3–1.2)
BLOOD UREA NITROGEN: 6 mg/dL — ABNORMAL LOW (ref 9–23)
BUN / CREAT RATIO: 10
CALCIUM: 8.9 mg/dL (ref 8.7–10.4)
CHLORIDE: 106 mmol/L (ref 98–107)
CO2: 26.2 mmol/L (ref 20.0–31.0)
CREATININE: 0.6 mg/dL (ref 0.55–1.02)
EGFR CKD-EPI (2021) FEMALE: >90 mL/min/1.73m2 (ref >=60)
GLUCOSE RANDOM: 86 mg/dL (ref 70–179)
POTASSIUM: 3.7 mmol/L (ref 3.4–4.8)
PROTEIN TOTAL: 5.7 g/dL (ref 5.7–8.2)
SODIUM: 144 mmol/L (ref 135–145)

## 2024-01-28 LAB — CBC
HEMATOCRIT: 29.5 % — ABNORMAL LOW (ref 34.0–44.0)
HEMOGLOBIN: 9.9 g/dL — ABNORMAL LOW (ref 11.3–14.9)
MEAN CORPUSCULAR HEMOGLOBIN CONC: 33.5 g/dL (ref 32.0–36.0)
MEAN CORPUSCULAR HEMOGLOBIN: 27.2 pg (ref 25.9–32.4)
MEAN CORPUSCULAR VOLUME: 81 fL (ref 77.6–95.7)
MEAN PLATELET VOLUME: 9.4 fL (ref 6.8–10.7)
PLATELET COUNT: 240 10*9/L (ref 150–450)
RED BLOOD CELL COUNT: 3.64 10*12/L — ABNORMAL LOW (ref 3.95–5.13)
RED CELL DISTRIBUTION WIDTH: 16.2 % — ABNORMAL HIGH (ref 12.2–15.2)
WBC ADJUSTED: 4.7 10*9/L (ref 3.6–11.2)

## 2024-01-28 LAB — PROTIME-INR
INR: 1.13
PROTIME: 12.8 s — ABNORMAL HIGH (ref 9.9–12.6)

## 2024-01-28 LAB — CORTISOL: CORTISOL TOTAL: 1.1 ug/dL — CL (ref >=1.5)

## 2024-01-28 MED ADMIN — acetaminophen (TYLENOL) tablet 1,000 mg: 1000 mg | ORAL | @ 07:00:00

## 2024-01-28 MED ADMIN — acetaminophen (TYLENOL) tablet 1,000 mg: 1000 mg | ORAL | @ 22:00:00

## 2024-01-28 MED ADMIN — acetaminophen (TYLENOL) tablet 1,000 mg: 1000 mg | ORAL | @ 15:00:00

## 2024-01-28 MED ADMIN — pantoprazole (Protonix) EC tablet 40 mg: 40 mg | ORAL | @ 22:00:00

## 2024-01-28 MED ADMIN — pantoprazole (Protonix) EC tablet 40 mg: 40 mg | ORAL | @ 13:00:00

## 2024-01-28 MED ADMIN — cetirizine (ZYRTEC) tablet 20 mg: 20 mg | ORAL | @ 13:00:00

## 2024-01-28 MED ADMIN — ondansetron (ZOFRAN) injection 4 mg: 4 mg | INTRAVENOUS | @ 09:00:00

## 2024-01-28 MED ADMIN — ondansetron (ZOFRAN) injection 4 mg: 4 mg | INTRAVENOUS | @ 18:00:00

## 2024-01-28 MED ADMIN — famotidine (PEPCID) tablet 20 mg: 20 mg | ORAL | @ 07:00:00

## 2024-01-28 MED ADMIN — HYDROmorphone (PF) (DILAUDID) injection 1 mg: 1 mg | INTRAVENOUS | @ 09:00:00 | Stop: 2024-02-02

## 2024-01-28 MED ADMIN — HYDROmorphone (PF) (DILAUDID) injection 1 mg: 1 mg | INTRAVENOUS | @ 18:00:00 | Stop: 2024-02-02

## 2024-01-28 MED ADMIN — HYDROmorphone (PF) (DILAUDID) injection 1 mg: 1 mg | INTRAVENOUS | @ 22:00:00 | Stop: 2024-02-02

## 2024-01-28 MED ADMIN — HYDROmorphone (PF) (DILAUDID) injection 1 mg: 1 mg | INTRAVENOUS | @ 13:00:00 | Stop: 2024-02-02

## 2024-01-28 MED ADMIN — hydrocortisone (CORTEF) tablet 10 mg: 10 mg | ORAL | @ 20:00:00

## 2024-01-28 MED ADMIN — escitalopram oxalate (LEXAPRO) tablet 5 mg: 5 mg | ORAL | @ 13:00:00

## 2024-01-28 MED ADMIN — buprenorphine 20 mcg/hour transdermal patch 1 patch: 1 | TRANSDERMAL | @ 07:00:00

## 2024-01-28 MED ADMIN — butalbital-acetaminophen-caffeine (ESGIC) per tablet 1 tablet: 1 | ORAL | @ 02:00:00

## 2024-01-28 MED ADMIN — scopolamine (TRANSDERM-SCOP) 1 mg over 3 days topical patch 1 mg: 1 | TOPICAL | @ 07:00:00

## 2024-01-28 MED ADMIN — oxyCODONE (ROXICODONE) 5 mg/5 mL solution 15 mg: 15 mg | ORAL | @ 15:00:00 | Stop: 2024-01-28

## 2024-01-28 MED ADMIN — oxyCODONE (ROXICODONE) 5 mg/5 mL solution 15 mg: 15 mg | ORAL | @ 07:00:00 | Stop: 2024-01-28

## 2024-01-28 MED ADMIN — oxyCODONE (ROXICODONE) 5 mg/5 mL solution 15 mg: 15 mg | ORAL | @ 11:00:00 | Stop: 2024-01-28

## 2024-01-28 MED ADMIN — promethazine (PHENERGAN) tablet 25 mg: 25 mg | ORAL | @ 05:00:00

## 2024-01-28 MED ADMIN — promethazine (PHENERGAN) tablet 25 mg: 25 mg | ORAL | @ 11:00:00

## 2024-01-28 MED ADMIN — oxyCODONE (ROXICODONE) 5 mg/5 mL solution 15 mg: 15 mg | ORAL | @ 02:00:00 | Stop: 2024-01-28

## 2024-01-28 MED ADMIN — HYDROmorphone (PF) (DILAUDID) injection 1 mg: 1 mg | INTRAVENOUS | @ 05:00:00 | Stop: 2024-01-28

## 2024-01-28 MED ADMIN — HYDROmorphone (PF) (DILAUDID) injection 1 mg: 1 mg | INTRAVENOUS | @ 01:00:00 | Stop: 2024-01-28

## 2024-01-28 MED ADMIN — sodium chloride (NS) 0.9 % infusion: 10 mL/h | INTRAVENOUS | @ 05:00:00 | Stop: 2024-01-28

## 2024-01-28 MED ADMIN — sodium chloride 0.45% (1/2 NS) infusion: 100 mL/h | INTRAVENOUS | @ 07:00:00 | Stop: 2024-01-28

## 2024-01-28 NOTE — Treatment Plan (Signed)
 [name] [name] is a 23 y.o. female who is presenting to [facility] with GI bleed.      Acute on chronic LGI bleeding - Iron  Deficiency: Increased bleeding and pain for the past two weeks in the setting of prior colectomy and ileoanal anastamosis as below. Has known ulcers affecting her J pouch and history of prior GIBs.    - GI consulted for consideration of EGD and pouchoscopy  - consider IV iron  this admission, patient does have a history of rash with prior infusions of ferrlecit  so will require pre-medication with antihistamines.   - her iron  infusion protocol per last oncology note is :cetirizine  20 mg twice daily for 48 hours prior to infusion and at least 72 hours after; prednisone  50 mg x 2 doses at 13 and 7 hours prior to infusion then methylprednisolone  40 mg IV immediately prior to infusion; diphenhydramine  50 mg IV, famotidine  40 mg IV 30 minutes prior to infusion. 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 Loris. Diagnosed at age 28, she has numerous desmoid tumors including paraspinal, chest wall, R arm/wrist, and a mesenteric/abdominal mass which have grown over time and in many cases shrunk on their own. Not on cancer directed therapy until 2024 when she trialed sorafenib  however unable to tolerate due to side effect profile. Now on nirogacestat  since 09/2022, though recently has been held due to diarrhea, bleeding.  - notify oncology of admission  - breast ultrasound ordered given new lump in left breast     3. Acute on Chronic Pain: [facility]. Per pain plan:  - continue home butrans  patch  - Oxycodone  15mg  Q4hr PRN, IV dilaudid  1 mg Q4hr PRN  - Baclofen  5mg  TID     4. Mild adrenal insufficiency: Tentative diagnosis during last admission, is due for cortisol re-check this week  - Hydrocortisone  10mg  daily  - Cortisol ordered for Thursday morning (supposed to get this done as an OP)     5. GERD: continue home famotidine  20 mg nightly     6. Depression: continue home lexapro  5 mg daily      7. Anxiety: continue home ativan  0.5 mg PRN     Prophylaxis  Ambulation     Diet  NPO Sips with meds; Procedure/Test     Code Status / HCDM  Full Code    HCDM (patient stated preference): Virts,[name] - Mother - [phone]    HCDM, back-up (If primary HCDM is unavailable): Deyjah, Kindel - Father - [phone]    HCDM, back-up (If primary HCDM is unavailable): Analee, Montee - Sister - [phone]

## 2024-01-28 NOTE — H&P (Signed)
 [facility]   History and Physical       Assessment and Plan     [name] [name] is a 23 y.o. female who is presenting to [facility] with GI bleed.     1. Bright red blood per rectum - Iron  Deficiency: Increased bleeding and pain for the past two weeks in the setting of prior colectomy and ileoanal anastamosis as below. Has known ulcers affecting her J pouch and history of prior GIBs.  Her mother shows me photos of what appears to be a mild amount of bright red blood throughout stool, I do suspect this is driving her iron  deficiency albeit for now no evidence of anemia.  - GI consult in AM for consideration of EGD and pouchoscopy, NPO now  - Consider IV iron  this admission, patient does have a history of rash with prior infusions of ferrlecit  so will require pre-medication with antihistamines.   - IV zofran  4 mg Q6hr PRN  - Trend CBC    Secondary/Additional Active Problems  2. 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 Loris. Diagnosed at age 1, she has numerous desmoid tumors including paraspinal, chest wall, R arm/wrist, and a mesenteric/abdominal mass which have grown over time and in many cases shrunk on their own. Not on cancer directed therapy until 2024 when she trialed sorafenib  however unable to tolerate due to side effect profile. Now on nirogacestat  since 09/2022, though recently has been held due to diarrhea, bleeding.  - Notify oncology of admission  - Breast ultrasound ordered given new lump in left breast    3. Acute on Chronic Pain: Follows with [facility]. Per pain plan:  - Continue home butrans  patch  - Oxycodone  15mg  Q4hr PRN, IV dilaudid  1 mg Q4hr PRN  - Baclofen  5mg  TID    4. Mild adrenal insufficiency: Tentative diagnosis during last admission, is due for cortisol re-check this week  - Hydrocortisone  10mg  daily  - Cortisol ordered for Thursday morning (supposed to get this done as an OP)    5. GERD: Continue home famotidine  20 mg nightly    6. Depression: Continue home lexapro  5 mg daily     7. Anxiety: Continue home ativan  0.5 mg PRN    Prophylaxis  Ambulation    Diet  NPO Sips with meds; Procedure/Test    Code Status / HCDM  Full Code    HCDM (patient stated preference): Mier,[name] - Mother - [phone]    HCDM, back-up (If primary HCDM is unavailable): Madaline, Lefeber - Father - [phone]    HCDM, back-up (If primary HCDM is unavailable): Tinamarie, Przybylski - Sister - [phone]    Significant Comorbid Conditions:  -Malnutrition POA requiring further investigation, treatment, or monitoring  -Anemia POA requiring further investigation or monitoring  Hypernatremia    Issues Impacting Complexity of Management:  -Parenteral controlled medications: IV Dilaudid     HPI      The patient is a 23 year old female with a history of [name], desmoid fibromatosis, chronic pain, iron  deficiency anemia, and severe protein-calorie malnutrition. She underwent colectomy with ileoanal anastomosis in 2022 and takedown of loop ileostomy in 2022. Recent admissions involved treatment for chronic pain, pouchitis, iron  deficiency anemia requiring IV ferumoxytol , and a GI bleed secondary to ulceration, leading to the initiation of hydrocortisone  for mild secondary adrenal insufficiency. Her pain management has involved various opioids and adjuncts, and she has a care plan for pain management in her chart. The patient  presented to the ED today complaining of blood in her stool for two weeks and syncopal episodes. Her chronic pain has been hard to control with her home meds, and has also involved her epigastrium which is has not done in the past. Her mother has pictures of her stool which show bright red blood mixed in with loose stools. Patient has also noticed a new lump in her left breast, and is wondering if this could be another desmoid tumor. She is also wondering if she needs another IV iron  infusion, and is frustrated that her medical problems never seem to improve.     Med Rec Confidence   I reviewed the Medication List. The current list is Accurate    Physical Exam   Temp:  [36.5 ??C (97.7 ??F)-36.8 ??C (98.2 ??F)] 36.8 ??C (98.2 ??F)  Pulse:  [65-110] 65  SpO2 Pulse:  [64-89] 69  Resp:  [12-27] 16  BP: (96-124)/(59-71) 118/71  SpO2:  [96 %-99 %] 98 %  Body mass index is 20.93 kg/m??.    General: Well appearing, alert, conversant, cooperative, and in no distress.  Eyes: No scleral icterus.  Cardiovascular: Normal rate, regular rhythm. No murmur.   Extremities: Warm to touch. Regular 2+ radial pulses bilaterally. No LE edema. Few scattered firm nodules on arms and legs, non-tender.   Respiratory: Normal respiratory effort on room air. Clear to auscultation bilaterally.  Gastrointestinal: Soft with healed surgical scars. TTP in lower abdomen.   Neurologic: Alert and fully oriented. Normal speech and language. Moving all extremities purposefully. No focal deficits.  Skin: Skin is warm, dry and intact. No rash.  Psychiatric: Mood and affect are normal. Speech and behavior are normal.  ___________________________________________________________________    Medications     Prior to Admission medications   Medication Dose, Route, Frequency   acetaminophen  (TYLENOL  EXTRA STRENGTH) 500 MG tablet 1,000 mg, Oral, Every 8 hours   ammonium lactate  (LAC-HYDRIN ) 12 % lotion 1 Application, Every 1 hour prn   baclofen  (LIORESAL ) 10 MG tablet 5 mg, Oral, 3 times a day (standard)   buprenorphine  20 mcg/hour PTWK transdermal patch 1 patch, Transdermal, Every 7 days   butalbital -acetaminophen -caffeine  (ESGIC ) 50-325-40 mg per tablet 1 tablet, Oral, 2 times a day PRN   cetirizine  (ZYRTEC ) 10 MG tablet 10 mg, 2 times a day (standard)   cetirizine  (ZYRTEC ) 10 MG tablet 20 mg, Oral, 2 times a day, Take starting 2 days before IV iron  and for 5 days after   diphenoxylate-atropine (LOMOTIL) 2.5-0.025 mg per tablet 1 tablet, Oral, 4 times a day PRN escitalopram  oxalate (LEXAPRO ) 5 MG tablet 5 mg, Oral, Daily (standard)   famotidine  (PEPCID ) 20 MG tablet 20 mg, Oral, Nightly   fluticasone  propionate (FLONASE ) 50 mcg/actuation nasal spray Use 1 spray under the butrans  patch once every 7 days to prevent allergic reaction to the adhesive   hydrocortisone  (ANUSOL -HC) 2.5 % rectal cream AS DIRECTED   hydrocortisone  (CORTEF ) 10 MG tablet 10 mg, Oral, Every morning   lidocaine  (ASPERCREME) 4 % patch 1 patch, Transdermal, Daily (standard), Apply for 12 hours then remove for 12 hours.   lidocaine  (XYLOCAINE ) 2 % Soln Mix with Children's Benadryl  and Maalox. Swish and spit every 4 hours as needed for mouth pain   lidocaine  (XYLOCAINE ) 5 % ointment Apply to affected area daily   loperamide  (IMODIUM ) 2 mg capsule 2 mg, Oral, 4 times a day PRN   LORazepam  (ATIVAN ) 0.5 MG tablet 0.5 mg, Oral, 2 times a day PRN  naloxegol  (MOVANTIK ) 12.5 mg tablet 12.5 mg, Oral, Daily PRN   naloxone  (NARCAN ) 4 mg nasal spray One spray in either nostril once for known/suspected opioid overdose. May repeat every 2-3 minutes in alternating nostril til EMS arrives   [Paused] nirogacestat  (OGSIVEO ) 100 mg tablet 100 mg, Oral, 2 times a day (standard), Swallow tablets whole; do not break, crush, or chew.  Paused since Thu 10/22/2023 until manually resumed   ondansetron  (ZOFRAN -ODT) 8 MG disintegrating tablet 8 mg, orally disintegrating tablet, Every 8 hours PRN   oxyCODONE  (ROXICODONE ) 10 mg immediate release tablet 20 mg, Oral, Every 8 hours PRN, DNF: 01/15/24   oxyCODONE  (ROXICODONE ) 20 mg immediate release tablet 20 mg, Oral, Every 6 hours PRN, DNF: 12/16/23   pantoprazole  (PROTONIX ) 40 MG tablet 40 mg, Oral, 2 times a day (AC)   predniSONE  (DELTASONE ) 50 MG tablet 50 mg, Oral, 2 times a day (standard), Take 13 and 7 hours prior to iron  infusion.   promethazine  (PHENERGAN ) 12.5 MG tablet 12.5 mg, Oral, Every 6 hours PRN   scopolamine  (TRANSDERM-SCOP) 1 mg over 3 days 1 patch, Transdermal, Every 72 hours   sucralfate  (CARAFATE ) 100 mg/mL suspension 10 mL, 4 times a day   topiramate  (TOPAMAX ) 25 MG capsule 25 mg, Oral, 2 times a day (standard)   tranexamic acid  650 mg Tab tablet 1,300 mg, Oral, 3 times a day PRN       Allergies   Adhesive tape-silicones; Ferrlecit  [sodium ferric gluconat-sucrose]; Levofloxacin ; Methylnaltrexone ; Neomycin; Papaya; Morphine; Zosyn [piperacillin-tazobactam]; Adhesive; Compazine [prochlorperazine]; Iron  analogues; Metoclopramide ; Metronidazole ; Reglan  [metoclopramide  hcl]; Iron  dextran; and Latex, natural rubber     Medical History   Past Medical History[1]    Social History   Tobacco use:   reports that she has never smoked. She has been exposed to tobacco smoke. She has never used smokeless tobacco.  Alcohol use:   reports no history of alcohol use.  Drug use:  reports no history of drug use.    Family History   Family History[2]    Surgical History   Past Surgical History[3]         [1]   Past Medical History:  Diagnosis Date    Abdominal pain     Acid reflux     occas    Anesthesia complication     itching, shaking, coldness; last few surgeries have gone much better    Cancer    (CMS-HCC)     Cataract of right eye     COVID-19 virus infection 01/2019    Cyst of thyroid determined by ultrasound     monitoring    [name]     2 right forearm, 1 left thigh, 1 right scapula, 1 under left clavicle; multiple    Difficult intravenous access     [name] (familial adenomatous polyposis)     [name] (HHS-HCC)     Gastric polyps     History of chemotherapy     last treatment approx 05/2019    History of colon polyps     History of COVID-19 01/2019    Ileus    (CMS-HCC) 03/16/2022    Iron  deficiency anemia due to chronic blood loss     received iron  infusion 11-2019    PONV (postoperative nausea and vomiting)     Rectal bleeding     Syncopal episodes     especially if becoming dehydrated   [2]   Family History  Problem Relation Age of Onset    No Known  Problems Mother     No Known Problems Father     No Known Problems Sister     No Known Problems Brother     Stroke Maternal Grandmother     Other Maternal Grandmother         benign lesions of liver and pancreas, further details unknown    Cancer Maternal Grandmother     Diabetes Maternal Grandmother     Hypertension Maternal Grandmother     Thyroid disease Maternal Grandmother     Arthritis Maternal Grandfather     Asthma Maternal Grandfather     COPD Paternal Grandmother         Deceased    Miscarriages / Stillbirths Paternal Grandmother     Alcohol abuse Paternal Grandfather         Deceased    No Known Problems Maternal Aunt     No Known Problems Maternal Uncle     No Known Problems Paternal Aunt     No Known Problems Paternal Uncle     Anesthesia problems Neg Hx     Broken bones Neg Hx     Cancer Neg Hx     Clotting disorder Neg Hx     Collagen disease Neg Hx     Diabetes Neg Hx     Dislocations Neg Hx     Fibromyalgia Neg Hx     Gout Neg Hx     Hemophilia Neg Hx     Osteoporosis Neg Hx     Rheumatologic disease Neg Hx     Scoliosis Neg Hx     Severe sprains Neg Hx     Sickle cell anemia Neg Hx     Spinal Compression Fracture Neg Hx     Melanoma Neg Hx     Basal cell carcinoma Neg Hx     Squamous cell carcinoma Neg Hx    [3]   Past Surgical History:  Procedure Laterality Date    COLON SURGERY      cyroablation      cystis removal      desmoid removal      IR INSERT PORT AGE GREATER THAN 5 YRS  07/20/2023    IR INSERT PORT AGE GREATER THAN 5 YRS 07/20/2023 Gu[place]ford Hicks, MD IMG VIR [facility]    PR CLOSE ENTEROSTOMY,RESEC+ANAST N/A 10/09/2020    Procedure: ILEOSTOMY TAKEDOWN;  Surgeon: Timothy Suleman Sadiq, MD;  Location: OR [place];  Service: General Surgery    PR COLONOSCOPY W/BIOPSY SINGLE/MULTIPLE N/A 10/27/2012    Procedure: COLONOSCOPY, FLEXIBLE, PROXIMAL TO SPLENIC FLEXURE; WITH BIOPSY, SINGLE OR MULTIPLE;  Surgeon: Annalee LABOR Mir, MD;  Location: PEDS PROCEDURE ROOM [place];  Service: Gastroenterology    PR COLONOSCOPY W/BIOPSY SINGLE/MULTIPLE N/A 09/14/2013    Procedure: COLONOSCOPY, FLEXIBLE, PROXIMAL TO SPLENIC FLEXURE; WITH BIOPSY, SINGLE OR MULTIPLE;  Surgeon: Annalee LABOR Mir, MD;  Location: PEDS PROCEDURE ROOM [place];  Service: Gastroenterology    PR COLONOSCOPY W/BIOPSY SINGLE/MULTIPLE N/A 11/08/2014    Procedure: COLONOSCOPY, FLEXIBLE, PROXIMAL TO SPLENIC FLEXURE; WITH BIOPSY, SINGLE OR MULTIPLE;  Surgeon: Annalee Dine Mir, MD;  Location: PEDS PROCEDURE ROOM [facility];  Service: Gastroenterology    PR COLONOSCOPY W/BIOPSY SINGLE/MULTIPLE N/A 12/26/2015    Procedure: COLONOSCOPY, FLEXIBLE, PROXIMAL TO SPLENIC FLEXURE; WITH BIOPSY, SINGLE OR MULTIPLE;  Surgeon: Annalee Dine Mir, MD;  Location: PEDS PROCEDURE ROOM [facility];  Service: Gastroenterology    PR COLONOSCOPY W/BIOPSY SINGLE/MULTIPLE N/A 09/02/2017    Procedure: COLONOSCOPY, FLEXIBLE, PROXIMAL TO SPLENIC FLEXURE; WITH BIOPSY, SINGLE  OR MULTIPLE;  Surgeon: Annalee Dine Mir, MD;  Location: PEDS PROCEDURE ROOM [facility];  Service: Gastroenterology    PR COLSC FLX W/REMOVAL LESION BY HOT BX FORCEPS N/A 08/27/2016    Procedure: COLONOSCOPY, FLEXIBLE, PROXIMAL TO SPLENIC FLEXURE; W/REMOVAL TUMOR/POLYP/OTHER LESION, HOT BX FORCEP/CAUTE;  Surgeon: Annalee Dine Mir, MD;  Location: PEDS PROCEDURE ROOM [facility];  Service: Gastroenterology    PR COLSC FLX W/RMVL OF TUMOR POLYP LESION SNARE TQ N/A 02/25/2019    Procedure: COLONOSCOPY FLEX; W/REMOV TUMOR/LES BY SNARE;  Surgeon: Lamar Jayson Ards, MD;  Location: [facility] PROCEDURES [place] [facility];  Service: Gastroenterology    PR COLSC FLX W/RMVL OF TUMOR POLYP LESION SNARE TQ N/A 03/13/2020    Procedure: COLONOSCOPY FLEX; W/REMOV TUMOR/LES BY SNARE;  Surgeon: Lamar Jayson Ards, MD;  [facility]: [facility]EDURES [place] [facility];  Service: Gastroenterology    PR ENDOSCOPY UPPER SMALL INTESTINE N/A 07/15/2023    Procedure: SMALL INTESTINAL ENDOSCOPY, ENTEROSCOPY BEYOND SECOND PORTION OF DUODENUM, NOT INCL ILEUM; DX, INCL COLLECTION OF SPECIMEN(S) BY BRUSHING OR WASHING, WHEN PERFORMED;  Surgeon: Charlanne Kipper, MD;  Location: [facility]URES [facility];  Service: Gastrointestinal    PR EXC SKIN BENIG 2.1-3 CM TRUNK,ARM,LEG Right 02/25/2017    Procedure: EXCISION, BENIGN LESION INCLUDE MARGINS, EXCEPT SKIN TAG, LEGS; EXCISED DIAMETER 2.1 TO 3.0 CM;  Surgeon: Jeyhan Suzan Wood, MD;  Location: CHILDRENS OR [facility];  Service: Plastics    PR EXC SKIN BENIG 3.1-4 CM TRUNK,ARM,LEG Right 02/25/2017    Procedure: EXCISION, BENIGN LESION INCLUDE MARGINS, EXCEPT SKIN TAG, ARMS; EXCISED DIAMETER 3.1 TO 4.0 CM;  Surgeon: Jeyhan Suzan Wood, MD;  Location: CHILDRENS OR [facility];  Service: Plastics    PR EXC SKIN BENIG >4 CM FACE,FACIAL Right 02/25/2017    Procedure: EXCISION, OTHER BENIGN LES INCLUD MARGIN, FACE/EARS/EYELIDS/NOSE/LIPS/MUCOUS MEMBRANE; EXCISED DIAM >4.0 CM;  Surgeon: Jeyhan Suzan Wood, MD;  Location: CHILDRENS OR [facility];  Service: Plastics    PR EXC TUMOR SOFT TISSUE LEG/ANKLE SUBQ 3+CM Right 08/05/2019    Procedure: EXCISION, TUMOR, SOFT TISSUE OF LEG OR ANKLE AREA, SUBCUTANEOUS; 3 CM OR GREATER;  Surgeon: Delon Charmaine Myron, MD;  Location: MAIN OR [place];  Service: Plastics    PR EXC TUMOR SOFT TISSUE LEG/ANKLE SUBQ <3CM Right 08/05/2019    Procedure: EXCISION, TUMOR, SOFT TISSUE OF LEG OR ANKLE AREA, SUBCUTANEOUS; LESS THAN 3 CM;  Surgeon: Delon Charmaine Myron, MD;  Location: MAIN OR [facility];  Service: Plastics    PR LAP, SURG PROCTECTOMY W J-POUCH N/A 08/10/2020    Procedure: ROBOTIC ASSISTED LAPAROSCOPIC PROCTOCOLECTOMY, ILEAL J POUCH, WITH OSTOMY;  Surgeon: Evalene Karalee Shilling, MD;  Location: OR [place];  Service: General Surgery    PR NDSC EVAL INTSTINAL POUCH DX W/COLLJ SPEC SPX N/A 01/23/2021    Procedure: ENDO EVAL SM INTEST POUCH; DX;  Surgeon: Mikle Cleora Martinez, MD;  Location: [facility] PROCEDURES [place] [facility];  Service: Gastroenterology    PR NDSC EVAL INTSTINAL POUCH DX W/COLLJ SPEC SPX N/A 08/27/2021    Procedure: ENDO EVAL SM INTEST POUCH; DX;  Surgeon: Rodgers JAYSON Minor, MD;  Location: [facility]URES [facility];  Service: Gastroenterology    PR NDSC EVAL INTSTINAL POUCH DX W/COLLJ SPEC SPX N/A 12/09/2021    Procedure: ENDO EVAL SM INTEST POUCH; DX;  Surgeon: Sherrill Liliane Olmsted, MD;  Location: GI PROCEDURES [facility];  Service: Gastroenterology    PR NDSC EVAL INTSTINAL POUCH DX W/COLLJ [facility] SPX Left 04/09/2022    Procedure: ENDO EVAL SM INTEST POUCH; DX;  Surgeon: Martinez Mikle Cleora, MD;  Location: [facility]EDURES [place] [facility];  Service: Gastroenterology    PR NDSC EVAL INTSTINAL POUCH DX W/COLLJ SPEC SPX N/A 08/05/2022    Procedure: ENDO EVAL SM INTEST POUCH; DX;  Surgeon: Lucian Mikle Pfeiffer, MD;  Location: GI PROCEDURES [facility];  Service: Gastroenterology    PR NDSC EVAL INTSTINAL POUCH DX W/COLLJ SPEC SPX N/A 03/13/2023    Procedure: ENDO EVAL SM INTEST POUCH; DX;  Surgeon: Rogerio Lauraine Sor, MD;  [facility]: GI PROCEDURES [facility];  Service: Gastroenterology    PR NDSC EVAL INTSTINAL POUCH DX W/COLLJ SPEC SPX N/A 07/06/2023    Procedure: ENDO EVAL SM INTEST POUCH; DX;  Surgeon: Junette Fonda CROME, MD;  Location: GI PROCEDURES [facility];  Service: Gastroenterology    PR NDSC EVAL INTSTINAL POUCH DX W/COLLJ SPEC SPX N/A 10/14/2023    Procedure: ENDO EVAL SM INTEST POUCH; DX;  Surgeon: Brenita Vannie BIRCH, MD;  Location: [facility] PROCEDURES [facility];  Service: Gastroenterology    PR NDSC EVAL INTSTINAL POUCH W/BX SINGLE/MULTIPLE N/A 01/20/2022    Procedure: ENDOSCOPIC EVAL OF SMALL INTESTINAL POUCH; DIAGNOSTIC, No biopsies;  Surgeon: Gwenn Dallas Ruth, MD;  Location: GI PROCEDURES [facility];  Service: Gastroenterology    PR NDSC EVAL INTSTINAL POUCH W/BX SINGLE/MULTIPLE N/A 02/13/2022    Procedure: ENDOSCOPIC EVAL OF SMALL INTESTINAL POUCH; DIAGNOSTIC, WITH BIOPSY;  Surgeon: Georgine Arlean Min, MD;  Location: GI PROCEDURES [facility];  Service: Gastroenterology    PR NDSC EVAL INTSTINAL POUCH W/BX SINGLE/MULTIPLE N/A 03/13/2023    Procedure: ENDOSCOPIC EVAL OF SMALL INTESTINAL POUCH; DIAGNOSTIC, WITH BIOPSY;  Surgeon: Rogerio Lauraine Sor, MD;  Location: GI PROCEDURES [facility];  Service: Gastroenterology    PR UNLISTED PROCEDURE SMALL INTESTINE  01/23/2021    Procedure: UNLISTED PROCEDURE, SMALL INTESTINE;  Surgeon: Mikle Pfeiffer Lucian, MD;  Location: [facility] PROCEDURES [place] [facility];  Service: Gastroenterology    PR UNLISTED PROCEDURE SMALL INTESTINE  02/13/2022    Procedure: UNLISTED PROCEDURE, SMALL INTESTINE;  Surgeon: Georgine Arlean Min, MD;  Location: GI PROCEDURES [facility];  Service: Gastroenterology    PR UPPER GI ENDOSCOPY,BIOPSY N/A 10/27/2012    Procedure: UGI ENDOSCOPY; WITH BIOPSY, SINGLE OR MULTIPLE;  Surgeon: Annalee LABOR Mir, MD;  Location: PEDS PROCEDURE ROOM [facility];  Service: Gastroenterology    PR UPPER GI ENDOSCOPY,BIOPSY N/A 09/14/2013    Procedure: UGI ENDOSCOPY; WITH BIOPSY, SINGLE OR MULTIPLE;  Surgeon: Annalee LABOR Mir, MD;  Location: PEDS PROCEDURE ROOM [facility];  Service: Gastroenterology    PR UPPER GI ENDOSCOPY,BIOPSY N/A 11/08/2014    Procedure: UGI ENDOSCOPY; WITH BIOPSY, SINGLE OR MULTIPLE;  Surgeon: Annalee Dine Mir, MD;  Location: PEDS PROCEDURE ROOM [facility];  Service: Gastroenterology    PR UPPER GI ENDOSCOPY,BIOPSY N/A 12/26/2015    Procedure: UGI ENDOSCOPY; WITH BIOPSY, SINGLE OR MULTIPLE;  Surgeon: Annalee Dine Mir, MD;  Location: PEDS PROCEDURE ROOM [facility];  Service: Gastroenterology    PR UPPER GI ENDOSCOPY,BIOPSY N/A 08/27/2016    Procedure: UGI ENDOSCOPY; WITH BIOPSY, SINGLE OR MULTIPLE;  Surgeon: Annalee Dine Mir, MD;  Location: PEDS PROCEDURE ROOM [facility];  Service: Gastroenterology    PR UPPER GI ENDOSCOPY,BIOPSY N/A 09/02/2017    Procedure: UGI ENDOSCOPY; WITH BIOPSY, SINGLE OR MULTIPLE;  Surgeon: Annalee Dine Mir, MD;  Location: PEDS PROCEDURE ROOM [facility];  Service: Gastroenterology    PR UPPER GI ENDOSCOPY,BIOPSY N/A 03/13/2020    Procedure: UGI ENDOSCOPY; WITH BIOPSY, SINGLE OR MULTIPLE;  Surgeon: Lamar Jayson Ards, MD;  Location: [facility] PROCEDURES [place] [facility];  Service: Gastroenterology    PR UPPER  GI ENDOSCOPY,BIOPSY N/A 09/05/2021    Procedure: UGI ENDOSCOPY; WITH BIOPSY, SINGLE OR MULTIPLE;  Surgeon: Rosamond Never, MD;  Location: GI PROCEDURES [facility];  Service: Gastroenterology    PR UPPER GI ENDOSCOPY,DIAGNOSIS N/A 01/20/2022    Procedure: UGI ENDO, INCLUDE ESOPHAGUS, STOMACH, & DUODENUM &/OR JEJUNUM; DX W/WO COLLECTION SPECIMN, BY BRUSH OR WASH;  Surgeon: Gwenn Dallas Ruth, MD;  Location: GI PROCEDURES [facility];  Service: Gastroenterology    TUMOR REMOVAL      multiple-head, neck, back, hand, right flank, multiple

## 2024-01-28 NOTE — Consults (Signed)
 [facility] Gastroenterology Consult Service   Initial Consultation         Assessment and Recommendations:   [name] [name] is a 23 y.o. 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 The patient is seen in consultation at the request of Marga Skeens, MD ([facility])) for hematochezia.    Hematochezia - IDA  Patient is presenting with several weeks of GI bleeding, fatigue, and abdominal pain, found to have iron  deficiency anemia on labs.  She is well-known to our service and has a history of chronic iron  deficiency anemia from intermittent oozing from superficial ulcerations in her ileoanal pouch.  She is being considered for double-balloon endoscopy at [facility] to evaluate for additional GI sources of blood loss.  Since she is having overt bleeding with bright red blood per rectum, we can perform EGD and pouch exam with potential therapies as indicated.  We touched briefly upon her abdominal pain during our visit, but she feels as though her pain has overall been much better controlled recently, so do not feel any additional interventions are warranted at this time.  Per discussion with hospitalist team, patient will be transferring to [facility] for additional support (oncology team, chronic pain service).    Recommendations:  - Per primary team, patient plan to transfer to [facility] for additional support (oncology, chronic pain service)  - Pain management per primary team  - IV iron  repletion  - Daily CBC  - Anticipated procedures as below at [facility]    GI Pre-Procedure Checklist  Procedure: Pouchoscopy  Anticipated Date of Procedure: 11/21  Anticoagulants/Antiplatelets: Not Applicable  Anesthesia Concerns: None  Diet: Order regular diet and make NPO at 12AM (midnight) day of procedure  Prep: Administer two tap water enemas 30 minutes apart    Issues Impacting Complexity of Management:  -None    Recommendations discussed with the patient's primary team. We will continue to follow along with you.    Subjective:   Patient presenting with a 2-week history of worsening rectal bleeding, fatigue, and epigastric abdominal pain.  Her mother has pictures of her bowel movements that were frankly bloody over the weekend.  She has had some associated presyncopal episodes.  Otherwise denies nausea and vomiting and says her appetite is better globally than it has been.  However, while her energy previously had been better, she notes that with ongoing bleeding, she has been unable to complete her activities of daily living in school, so presented to the ED for evaluation.  She also notes worsening of her abdominal pain, which remains diffuse but has become worse in her epigastric and RUQ region, but is not associated with meals.    Patient afebrile with stable vital signs.  Presented with a hemoglobin of 10.3 with labs consistent with iron  deficiency (ferritin 11.5, percent sat 8, low serum iron ).  CT A/P showing similar appearance of mild inflammation from the ileoanal anastomosis as can be seen with pouchitis, as well as other chronic findings.    Patient was seen at [facility] on 10/28/2023 for consideration of double-balloon enteroscopy to look for other source of blood loss.  Patient has not scheduled this procedure as of yet.    -I have reviewed the patient's prior records from [facility] as summarized in the HPI    Objective:   Temp:  [36.6 ??C (97.9 ??F)-36.8 ??C (  98.2 ??F)] 36.7 ??C (98.1 ??F)  Pulse:  [65-89] 87  SpO2 Pulse:  [64-89] 69  Resp:  [12-27] 16  BP: (102-119)/(58-71) 119/63  SpO2:  [96 %-99 %] 97 %    Gen: WDWN female in NAD, answers questions appropriately  Abdomen: Soft, diffusely tender to palpation, nondistended, no rebound/guarding, no hepatosplenomegaly  Extremities: No edema in the BLEs    Pertinent Labs & Studies:  -I have reviewed the patient's labs from 01/28/24 which show stable Hgb and iron  deficiency

## 2024-01-28 NOTE — Progress Notes (Signed)
 Patient was transported to [facility] main 7GMU [id number].Patient is alert and oriented. No signs of acute distress. Report Given to the receiving  staff nurse,Krishana. Belongings with the mother. Dilauded and zofran  given per requested before patient left. No other issues reported at that time.

## 2024-01-28 NOTE — Plan of Care (Signed)
 Shift Summary  Pain management required multiple interventions and medications, with pain scores remaining high and discomfort persisting throughout the shift.   Skin assessments identified pre-existing bruising and discoloration, with ongoing protective measures and no new injuries noted.   Fall prevention protocols were consistently implemented, including frequent checks and environmental safety measures, with no new falls or injuries.   MD was at bedside for significant events and admission, and laboratory results indicated low hemoglobin and hematocrit.   Overall, the shift was marked by persistent pain and discomfort, ongoing skin protection, and effective fall prevention strategies.     Absence of Hospital-Acquired Illness or Injury: No new hospital-acquired injuries or illnesses documented; skin assessments noted bruising on knees from a prior fall and discoloration on right arm from iron  infusion, with skin protection measures and pressure reduction devices in use throughout the shift.     Optimal Comfort and Wellbeing: Comfort interventions such as rest, Kpad, and pain medications were provided, but pain remained constant and acute, with some periods of gradual improvement followed by worsening discomfort.     Optimal Pain Control and Function: Pain was consistently described as aching and acute, with scores fluctuating between 6 and 10; multiple pain medications including oxycodone , hydromorphone , and acetaminophen  were administered, but pain persisted and worsened towards the end of the shift.     Absence of Fall and Fall-Related Injury: Fall prevention strategies were maintained throughout the shift, including frequent toileting, hourly visual checks, family presence, low bed, and nonskid footwear, with no new falls or injuries reported.

## 2024-01-28 NOTE — Plan of Care (Addendum)
 Shift Summary    Pt ambulates to bathroom with multiple bowel and bladder movements today. Pt requiring every 4 hour pain medication interventions. Diet advanced to regular and patient able to tolerate taking steroid tablet after eating. Cortisol level to be drawn on 01/29/24 at 0800. Cortisol this morning was very low,   Pt with transfer order to [facility] hospital. Pt requesting transfer to 7GMU or 4 Onc or BMTU vs other units.       Multiple PRN opioid administrations were required throughout the shift to address persistent pain.   Pain scores remained elevated, with only temporary relief after interventions.   Baclofen  was refused twice, limiting non-opioid pain management options. Pt states she only takes this prn and does not need it today.   Comfort improved briefly after interventions, but pain continued to be reported as constant and penetrating.   Overall, pain management required frequent reassessment and intervention, with ongoing discomfort noted.     Optimal Comfort and Wellbeing: Pain was reported as constant and penetrating throughout the shift, with some improvement in comfort after prn opioid administration and use of heating pad; however, pain levels fluctuated and remained elevated at times, and patient declined baclofen  twice.     Optimal Pain Control and Function: Pain scores ranged from 6 to 9, with brief periods of improvement following opioid administration, but overall pain remained persistent and required ongoing interventions.

## 2024-01-29 LAB — CORTISOL: CORTISOL TOTAL: 12.7 ug/dL (ref >=1.5)

## 2024-01-29 MED ADMIN — acetaminophen (TYLENOL) tablet 1,000 mg: 1000 mg | ORAL | @ 22:00:00

## 2024-01-29 MED ADMIN — acetaminophen (TYLENOL) tablet 1,000 mg: 1000 mg | ORAL | @ 07:00:00

## 2024-01-29 MED ADMIN — acetaminophen (TYLENOL) tablet 1,000 mg: 1000 mg | ORAL | @ 13:00:00

## 2024-01-29 MED ADMIN — pantoprazole (Protonix) EC tablet 40 mg: 40 mg | ORAL | @ 22:00:00

## 2024-01-29 MED ADMIN — pantoprazole (Protonix) EC tablet 40 mg: 40 mg | ORAL | @ 13:00:00

## 2024-01-29 MED ADMIN — cetirizine (ZYRTEC) tablet 20 mg: 20 mg | ORAL | @ 13:00:00

## 2024-01-29 MED ADMIN — cetirizine (ZYRTEC) tablet 20 mg: 20 mg | ORAL | @ 01:00:00

## 2024-01-29 MED ADMIN — ondansetron (ZOFRAN) injection 4 mg: 4 mg | INTRAVENOUS | @ 03:00:00

## 2024-01-29 MED ADMIN — ondansetron (ZOFRAN) injection 4 mg: 4 mg | INTRAVENOUS | @ 14:00:00

## 2024-01-29 MED ADMIN — famotidine (PEPCID) tablet 20 mg: 20 mg | ORAL | @ 01:00:00

## 2024-01-29 MED ADMIN — HYDROmorphone (PF) (DILAUDID) injection 1 mg: 1 mg | INTRAVENOUS | @ 19:00:00 | Stop: 2024-02-02

## 2024-01-29 MED ADMIN — HYDROmorphone (PF) (DILAUDID) injection 1 mg: 1 mg | INTRAVENOUS | @ 07:00:00 | Stop: 2024-02-02

## 2024-01-29 MED ADMIN — HYDROmorphone (PF) (DILAUDID) injection 1 mg: 1 mg | INTRAVENOUS | @ 23:00:00 | Stop: 2024-02-02

## 2024-01-29 MED ADMIN — HYDROmorphone (PF) (DILAUDID) injection 1 mg: 1 mg | INTRAVENOUS | @ 03:00:00 | Stop: 2024-02-02

## 2024-01-29 MED ADMIN — HYDROmorphone (PF) (DILAUDID) injection 1 mg: 1 mg | INTRAVENOUS | @ 15:00:00 | Stop: 2024-02-02

## 2024-01-29 MED ADMIN — oxyCODONE (ROXICODONE) 5 mg/5 mL solution 15 mg: 15 mg | ORAL | @ 01:00:00 | Stop: 2024-02-01

## 2024-01-29 MED ADMIN — oxyCODONE (ROXICODONE) 5 mg/5 mL solution 15 mg: 15 mg | ORAL | @ 20:00:00 | Stop: 2024-02-01

## 2024-01-29 MED ADMIN — oxyCODONE (ROXICODONE) 5 mg/5 mL solution 15 mg: 15 mg | ORAL | @ 13:00:00 | Stop: 2024-02-01

## 2024-01-29 MED ADMIN — oxyCODONE (ROXICODONE) 5 mg/5 mL solution 15 mg: 15 mg | ORAL | @ 06:00:00 | Stop: 2024-02-01

## 2024-01-29 MED ADMIN — escitalopram oxalate (LEXAPRO) tablet 5 mg: 5 mg | ORAL | @ 13:00:00

## 2024-01-29 MED ADMIN — midazolam (VERSED) injection: INTRAVENOUS | @ 17:00:00 | Stop: 2024-01-29

## 2024-01-29 MED ADMIN — Propofol (DIPRIVAN) injection: INTRAVENOUS | @ 17:00:00 | Stop: 2024-01-29

## 2024-01-29 MED ADMIN — promethazine (PHENERGAN) tablet 25 mg: 25 mg | ORAL | @ 08:00:00

## 2024-01-29 MED ADMIN — meperidine (DEMEROL) injection: INTRAVENOUS | @ 17:00:00 | Stop: 2024-01-29

## 2024-01-29 MED ADMIN — meperidine (DEMEROL) injection: INTRAVENOUS | @ 18:00:00 | Stop: 2024-01-29

## 2024-01-29 MED ADMIN — lactated Ringers infusion: INTRAVENOUS | @ 17:00:00 | Stop: 2024-01-29

## 2024-01-29 MED ADMIN — dexmedeTOMIDine (Precedex) injection: INTRAVENOUS | @ 18:00:00 | Stop: 2024-01-29

## 2024-01-29 NOTE — Consults (Signed)
 [facility] Consult Service   Initial Consultation         Assessment and Recommendations:   [name] [name] is a 23 y.o. 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 with hematochezia and rectal bleeding. The patient is seen in consultation at the request of Dan Rocher, MD ([facility])) for gastrointestinal bleeding and hematochezia.    Acute on chronic rectal bleeding  Hematochezia  Chronic IDA  H/o [name] s/p TAC  Desmoid tumors  Reported syncope  15F presenting with reported 2 weeks of worsening hematochezia with reported syncopal episodes, worsening epigastric pain, and pelvic floor pain/fullness. Patient says these symptoms are different from her chronic symptoms. Thankfully, VSS, hemoglobin stable (ranges from 8s-10s) over the last few months. Patient with known IPAA anastomotic ulceration that has been treated previously (recent upper 07/2023, pouch exam 10/2023). Query for small bowel bleed, patient referred to [facility] and evaluated for DBE (scheduled for non urgent DBE). Given BRBPR suspect lower GI source (known IPAA ulceration, vs other etiology such as AVM [small bowel], pouchitis, hemorrhoids). Her worsening of other chronic symptoms likely due functional etiology, however, will proceed with EGD to evaluate upper symptoms and pouch exam.    - plan for EGD and pouchoscopy today 11/21 (will use cap on EGD to try and evaluate ampulla for [name] surveillance)  - please keep NPO until after procedures  - pain regimen per primary team      GI Pre-Procedure Checklist  Procedure: Upper Endoscopy and Pouchoscopy  Anticipated Date of Procedure: 11/21  Anticoagulants/Antiplatelets: Not Applicable  Anesthesia Concerns: None  Diet: Keep patient NPO  Prep: one tap water enema    Issues Impacting Complexity of Management:  -None    Recommendations discussed with the patient's primary team. We will continue to follow along with you.    Subjective:     Patient presented to [facility] ED 01/27/24 with complaints of rectal bleeding. She has had chronic rectal bleeding for a long time now and has undergone multiple endoscopies to evaluate this. However, she says around 2 weeks ago the nature of her symptoms changed. The bleeding became more persistent and nearly every bowel movement has some bright red blood in the bowl then stool that is bloody. She trialed imodium  which did not help. She also endorses having syncopal episodes during the last few weeks. Mom reports that she was needed around 5 iron  infusions this year which is many more than previously. Patient also reports change in abdominal pain, describing epigastric pain. Additionally has new pelvic pressure and fecal urgency. 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    Historically IDA thought likely from intermittent oozing from anastomotic ulceration in her IPAA that has been treated, however, the impression previously is that the treatment was not definitive.    -I have reviewed the patient's prior records from previous visits and admissions as summarized in the HPI    Objective:   Temp:  [36.7 ??C (98.1 ??F)-36.8 ??C (98.2 ??F)] 36.8 ??C (98.2 ??F)  Pulse:  [76-89] 89  Resp:  [16-17] 17  BP: (102-119)/(63-78) 112/78  SpO2:  [97 %-100 %] 100 %    Gen: WDWN female in NAD, answers questions appropriately  Abdomen: Soft, TTP  Extremities: No edema in the BLEs    Pertinent  Labs & Studies:  -labs, imaging, and recent endoscopies below reviewed:      Labs and Imaging:__________________  __________________________________  01/28/24 labs  - hgb 9.9, plt 240, Cr 0.60    01/27/24 CTAP  Impression   No adverse interval change. Similar appearance of mild inflammation about the ileoanal anastomosis as can be seen with pouchitis. No bowel obstruction.      -- Similar size of enhancing soft tissue mass in the central mesentery measuring up to 3.3 cm, favored to represent a desmoid tumor.      -- Stable size of enhancing right sided rectus abdominis mass, measuring up to 2.5 cm. Additional soft tissue nodules along the left flank and bilateral gluteal regions are overall similar in size to prior.       Endoscopies:______________________  __________________________________    10/14/23 pouchoscopy  Impression:            - Rectal cuff with healthy appearing mucosa seen.                         - Ileal pouch with healthy appearing mucosa seen.                          Treated with argon plasma coagulation (APC).                         - Scattered aphthae were found in the pre-pouch ileum.                         - No specimens collected.    07/15/23 small bowel enteroscopy  Impression:            - Normal esophagus.                         - Multiple fundic gland polyps.                         - Normal examined duodenum.                         - The examined portion of the jejunum was normal.                         - No evidence of bleeding.                         - No specimens collected.    07/06/23 pouchoscopy  Impression:            - Rectal cuff with healthy appearing mucosa seen.                         - Ileal pouch with healthy appearing mucosa and an                          intact appearance seen.                         - Very mild inflammation was found in the pre-pouch  ileum, unlikely to be cause of reported symptoms.                         - The examination was otherwise normal.                         - No specimens collected.

## 2024-01-29 NOTE — Plan of Care (Signed)
 Shift Summary  Pain was managed with multiple PRN medications, resulting in temporary relief but recurring pain later in the shift.   Patient underwent GI procedures with sedation and pain control medications administered in both procedure and PACU settings.   Nausea was treated with ondansetron  in the morning.   Patient refused baclofen  and hydrocortisone  due to personal reasons and inability to take on an empty stomach.   Overall, patient remained alert, oriented, and calm, with intermittent improvement in comfort and pain control.     Optimal Comfort and Wellbeing: Abdominal pain decreased from severe to none after morning interventions, but increased again in the afternoon; multiple pain medications were administered PRN, and patient remained alert and calm throughout the shift.     Absence of Allergy  Symptoms: No documentation or medication administration related to allergy  symptoms was noted during the shift.     Optimal Pain Control and Function: Pain scores improved significantly after morning pain medication administration, reaching zero at midday, but increased again later in the shift despite further PRN pain medication; pain remained localized to the abdomen.

## 2024-01-29 NOTE — Plan of Care (Signed)
 Pt alert & oriented x4. On RA. Family at bedside. Self care. PRN oxy and dilaudid  given during the shift for pain, with good affect. Prn phenergan  given for nausea during the shift. Pt NPO at midnight for procedure. Safety precautions maintained throughout the shift.   Shift Summary  Pain escalated throughout the shift, with multiple pain medications administered and pain scores reaching 10.  Family was present and supportive, with environmental modifications made for safety and comfort during visits.  Fall prevention strategies were consistently implemented, including frequent toileting, hourly checks, and side rails, with no falls reported.  Braden Scale score decreased but skin assessments remained within defined limits, and no skin breakdown was noted.  Patient remained in bed and required standby assistance for mobility, with overall status reflecting ongoing pain and active GI bleed.    Optimal Comfort and Wellbeing: Pain in the abdomen and pelvis increased throughout the shift, with pain scores rising from 6 to 10 despite administration of HYDROmorphone  and oxyCODONE ; patient declined intervention at one point and described ongoing, constant pain with changing descriptors.    Rounds/Family Conference: Family was present and visiting consistently throughout the shift, and environmental modifications were made to support safety and comfort during their visits.    Optimal Pain Control and Function: Pain management included multiple administrations of HYDROmorphone  and oxyCODONE , but pain scores escalated and pain remained constant and ongoing; patient expressed a goal of no pain but did not achieve relief during the shift.    Absence of Fall and Fall-Related Injury: Fall reduction interventions were maintained, including low bed, frequent toileting, hourly visual checks, and side rails at 2/4; no falls or injuries occurred during the shift.    Skin Health and Integrity: Braden Scale score decreased from 21 to 17 early in the shift, but integumentary assessments remained within defined limits and no skin issues were documented.

## 2024-01-29 NOTE — Hospital Course (Addendum)
 Outpatient Follow Up:  - Patient to follow up with [facility] for a double balloon endoscopy at the end of December   - Oncology - follow up with [facility] oncology as scheduled  - Psychiatry / Psychology - follow up as scheduled  - Pain management - follow up as scheduled - care coordinated with outpatient provider - 3 day supply of opioid prescribed on discharge  -     Hospital course:    [name] [name] is a 23 year old female with a history of [name], desmoid tumors, anemia, and prior colectomy with ileoanal anastomosis, who presented with hematochezia, syncopal episodes, and abdominal pain.     Hematochezia - Abdominal pain:  Initial ED labs showed hypernatremia to 154 and a hemoglobin of 10.3. A CT scan showed inflammation at the ileoanal anastomosis, concerning for pouchitis, but no bowel obstruction.  GI was consulted, and EGD and pouchoscopy were performed on 01/29/24. The EGD revealed 30-50 gastric polyps without specimens collected. Pouchoscopy showed rectal cuff congestion, with biopsies obtained showing adenomatous polyp, but no evidence of pouchitis. GI recommended a non-urgent double balloon enteroscopy at [facility] in the future. They also discussed the rectal cuff findings with advanced endoscopy colleagues and mentioned that resection could be performed if needed. Due to continued bleeding with abdominal pain with lower and upper abd with right flank, more protuberant I went ahead and got CT enteroscopy, most tumors were the same, there was evidence of small bowel thickening, looking at the stool with blood it almost looks like bloody string/sheet and then seeing how much the desmoid tumors are throughout the mesentery with mesenteritis wondered if there is dynamic intermittent ischemia when motility occurs. Then reviewed the CT with radiology, and noted the narrowing of SMA significantly, if this also could just be intermittent blood flow restriction could also describe etiology. Regarding her acute on chronic pain, she follows with [facility] and was maintained on home Butrans  patch, oxycodone  15 mg prn, and baclofen  5 mg prn. She had prn IV Dilaudid  for severe breakthrough pain. Trialed scheduled Lomotil  for abdominal cramps and persistent loose stools >10-20/day. After pain with CT Enteroscopy burning and higher output of stool, used some peptobismol which helped slow it, but sometimes caused more pain due to the need to get it out without colon rearrangement. Using this sparingly. Post oral contrast she had burning with stooling ultimately needed to add Magic Mouthwash and Lidocaine  (viscous) as this helped dec the burning. Due to the constriction of SMA I discussed with radiology of how to assess, they rec either dynamic US  but we do not have this option, or CT Angio which showed patency of the SMA so mesenteric ischemia is unlikely to be a contributing factor.    Pain control was an issue throughout, and she had a KUB obtained couple of times for ongoing pain and bloating that showed non-specific small bowel distention. Ultimately due to continued symptoms and the explanation of this slow increase over months of pain and bleeding went ahead and got the CT Enteroscopy.     She will follow up closely with her primary outpatient oncologist regarding further therapies for desmoid tumors and will undergo further evaluation with a double-balloon enteroscopy scheduled at [facility] later this month.      She has been discharged with a 3 day supply of opioid pain medications and will follow up with her outpatient pain management specialist.  Will continue with antidiarrheals, prescriptions sent.      Iron  deficiency anemia:  Her Hgb remained relatively stable, and given her iron  deficiency anemia and issues with cutaneous reactions to IV iron  infusion infusion in the past, she was premedicated for the IV iron  infusion in advance and ultimately received it on 01/30/2024.  This was complicated by persistent hives requiring ongoing cetirizine  and IV Benadryl , with hives. Due to continued bleeding remeasured iron  still low, thus decision to give another dose, as she likely would not make it to Push Enteroscopy at end of Dec at [facility]. But she had even worse reaction, about 8-12 hrs post infusion with diffuse swelling, hives, raspy voice, required IV benadry, continued cetirizine  bid throughout, and decided on 12/10 due to continued swelling after getting solumedrol 125 mg overnight, to start IV solumedrol 20 mg bid for 48 hrs.  She weaned the steroids and was eventually able to get back on her home hydrocortiosone.  Hemoglobin stable on discharge.      Sinus Tachycardia - Patient received several doses of lasix  late in her hospital course to assist with swelling after the iron  infusion.  She felt like this ultimately helped the swelling but made her feel dehydrated.  She was treated cautiously with intravenous fluids with improvement in tachycardia and dehydration.      Concern for breast lesions:  Patient noted complaints of a new left breast lesions with ultrasound not suggestive of fluid collections or infection but a 'full diagnostic evaluation' needs to be considered via the Breast Imaging Department as an outpatient.    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  She will follow up closely with them as outpatient.  Desmoid related pain seems to be a significant driving factor in her chronic pain and she will discuss further therapies directed at this on outpatient follow up.       Mild adrenal insufficiency:   Tentative diagnosis during last admission. She was continued on PO hydrocortisone  10mg  daily. Endocrinology plans to follow up with her outpatient. Held while getting steroids but home dose resumed.       GERD:   Continued home famotidine  20 mg nightly, with ppi as it was more difficult trying to reduce acid. Patient complained of acid burning at ostomy site with some relief of topical paste - will continue topical paste on discharge.       Depression/Anxiety:  Continued home Lexapro  5 mg daily and PO Ativan  0.5 mg prn. Touched base with Dr. Antonetta with psychology, and she was able to follow up with patient while she was admitted.  Generally doing better than in the past and Dr. Antonetta is hopeful to have her visit in the outpatient setting.

## 2024-01-29 NOTE — Progress Notes (Signed)
 Hospital Medicine Daily Progress Note    Assessment/Plan:    Principal Problem:    GI bleed  Active Problems:    [name] (familial adenomatous polyposis)    [name]    Dehydration    Iron  deficiency                 [name] [name] is a 23 y.o. female that presented to [facility] with GI bleed.    Acute on chronic LGI bleeding - Iron  Deficiency: Increased bleeding and pain for the past two weeks in the setting of prior colectomy and ileoanal anastamosis as below. Has known ulcers affecting her J pouch and history of prior GIBs.    -GI consulted for consideration of EGD and pouchoscopy which was performed on 11/21  -On EGD noted multiple (30-50) gastric polyps, no specimens collected.   -On Pouchoscopy noted rectal cuff with congestion, biopsy obtained.  No evidence of pouchitis.  -GI recommends patient pursue double balloon enteroscopy non urgently at [facility] in the future  -Planning for IV iron  infusion 1gm dextran as tolerated in recent past, premed per her protocol as, patient does have a history of rash with prior infusions of ferrlecit  so will require pre-medication   -Her iron  infusion protocol per last oncology note is: cetirizine  20 mg twice daily for 48 hours prior to infusion and at least 72 hours after; prednisone  50 mg x 2 doses at 13 and 7 hours prior to infusion then methylprednisolone  40 mg IV immediately prior to infusion; diphenhydramine  50 mg IV, famotidine  40 mg IV 30 minutes prior to infusion. 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 Loris. Diagnosed at age 79, she has numerous desmoid tumors including paraspinal, chest wall, R arm/wrist, and a mesenteric/abdominal mass which have grown over time and in many cases shrunk on their own. Not on cancer directed therapy until 2024 when she trialed sorafenib  however unable to tolerate due to side effect profile. Now on nirogacestat  since 09/2022, though recently has been held due to diarrhea, bleeding.  -Oncology aware of admission  -In discussion with patient left axillary u/s did not specifically address the lesion location, plan to mark and obtain left breast u/s instead (may need additional discussion with radiology)     Acute on Chronic Pain: Follows with [facility]. Per pain plan:  -Continue home butrans  patch  -Oxycodone  15mg  Q4hr PRN, IV dilaudid  1 mg Q4hr PRN  -Baclofen  5mg  TID     Mild adrenal insufficiency: Tentative diagnosis during last admission, is due for cortisol re-check this week  -Continue Hydrocortisone  10mg  daily  -Cortisol noted 12.7, discussed briefly with endocrine that it is still possible that patient might have mild adrenal insufficiency and that she should continue on hydrocortisone  as above and they will plan to follow her up in clinic with repeat testing as acute hospitalization is likely to impact the results     GERD: continue home famotidine  20 mg nightly     Depression/Anxiety:  -Continue home lexapro  5 mg daily   -Continue home ativan  0.5 mg PRN  -Touched base with Dr. Antonetta with psychology and she was able to follow up with patient today.  Generally doing better than in the past and Dr. Antonetta is hopeful to have her visit in the outpatient setting.  She  will also be available for continued support during her admission     Prophylaxis  Ambulation     Diet  Regular diet as tolerated   ___________________________________________________________________    Subjective:  Met with patient briefly in the AM just prior to EGD and pouchoscopy.  We discussed that sometimes she has bad reactions with anesthesia and issues with pain.  She wanted to be sure I was aware.  We discussed reaching out to psychology while she is currently hospitalized and also discussed concerns that u/s or mammogram performed yesterday did not really address the new breast lesion she noted.  We also discussed that we would evaluate the cortisol results once they were available later today.  We also discussed beginning pre-treatment for IV iron  this evening in anticipation of IV iron  in the late AM tomorrow, patient agreeable.    Labs/Studies:  Labs and Studies from the last 24hrs per EMR and Reviewed    Objective:  Temp:  [36.5 ??C (97.7 ??F)-36.8 ??C (98.2 ??F)] 36.8 ??C (98.2 ??F)  Pulse:  [57-119] 63  SpO2 Pulse:  [58-117] 61  Resp:  [9-19] 16  BP: (94-120)/(44-94) 94/52  SpO2:  [95 %-100 %] 97 %    GEN: NAD, lying in bed, pleasant  EYES: EOMI  ENT: MMM  CV: RRR, port accessed upper right chest, tenderness to left upper outer breast with some fullness (RN present at the time of exam), area marked  PULM: CTA B  ABD: soft, tenderness to lower abd, +BS  EXT: No edema, firm nodules on arms/legs

## 2024-01-29 NOTE — Consults (Signed)
 [facility])   Consultation - Liaison (CL) Psychiatry  CL Psychology Follow-Up Note      Service Date:  01/29/24  Admit Date:  01/28/24  Clinician:  Olam Littles, PsyD  Intervention: 45 min Individual Psychotherapy  Face-to-Face Clinical Contact with Patient: 45 min  Pre-/Post-Encounter Activities: 30 min  Method of Interaction: In-Person    BACKGROUND INFORMATION AND REASON FOR REFERRAL:  Please see H&P for full details. 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 She was admitted to the Hospitalist service for management of the concerns above. Pt is well-known to clinical research associate from previous admissions. Please see prior notes for further details on psychosocial history. Psychology consulted to address psychological factors affecting Pt's medical condition.     ASSESSMENT  IMPRESSIONS/SUMMARY    At follow-up, Pt endorsed acute exacerbation of anxiety in the setting of several external stressors (e.g., educational, interpersonal). Her presentation appears consistent with historical diagnoses. Discussed emotion-focused coping approaches. Pt interested in follow-up with Psychology during admission to reduce anxiety and to optimize coping. Pt expressed interest in scheduling outpatient appointment with writer through [facility].     DIAGNOSTIC IMPRESSIONS    Major depressive disorder, recurrent episode, moderate  Generalized anxiety disorder  Posttraumatic stress disorder    Risk Assessment:  ASQ screening result: no intervention is necessary    -A suicide and violence risk assessment was performed as part of this evaluation. Risk factors for self-harm/suicide: chronic severe medical condition.  Protective factors against self-harm/suicide:  lack of active SI, no known access to weapons or firearms, no history of previous suicide attempts , motivation for treatment, utilization of positive coping skills, supportive family, supportive peers, supportive friends, sense of responsibility to family and social supports, presence of a significant relationship, presence of an available support system, employment or functioning in a structured work/academic setting, enjoyment of leisure actvities, expresses purpose for living, religious or spiritual prohibition to suicide/violence, current treatment compliance, effective problem solving skills, and safe housing.  Risk factors for harm to others: N/A. Protective factors against harm to others: no known history of violence towards others, no known history of threats of harm towards others, no active symptoms of psychosis, no active symptoms of mania, intolerant attititude toward deviance, high intellectual functioning, positive social orientation, religiosity, and connectedness to family.     Current suicide risk: low risk  Current homicide risk: low risk          PLAN  RECOMMENDATIONS    ## Safety and Observation Level:   -This patient is not currently under IVC. If safety concerns arise, please page psychiatry for an evaluation. Recommend routine level of observation per primary team.    ## Follow-up:  - The patient desires ongoing follow-up from CL Psychology while medically inpatient.    ## Disposition:  -There are no psychological contraindications to discharging this patient when medically appropriate.   -Pt interested in scheduling outpatient therapy appointment with writer. Will coordinate with Pt.   - When this patient is discharged, please ensure that their AVS includes information about the 59 Suicide & Crisis Lifeline.    ## Behavioral/environmental:   -Please adhere to delirium (prevention) protocol.   -Efforts to encourage normal circadian function are recommended.  Specifically, exposure to natural light during the day, minimization of light at night, consistent/routine times for sleep/wake, and avoiding of  daytime naps (if possible) may be helpful.     Olam GORMAN Littles, PsyD  Clinical Psychologist       SUBJECTIVE  SESSION CONTENT     Session began with a brief check-in. Pt recounted salient events that have occurred since she last met with writer during a prior admission. Pt shared updates on school, noting that she completed clinicals this semester. She also provided updates on interpersonal factors impacting mood. With respect to emotional functioning, Pt endorsed increased anxiety in the setting of external stressors. Pt apologized for not attending outpatient appointment, noting attendance is difficult due [place] of [place].     Pt's emotional experiences were validated and normalized. Reviewed emotion-focused coping approaches to reduce anxiety and enhance psychological flexibility. Pt expressed interest in ongoing support from Psychology during admission to address anxiety. She also indicated she would be interested in having an outpatient appointment during her holiday break.     OBJECTIVE   MENTAL STATUS     Orientation and consciousness: AOx4   Appearance: Appears stated age  Behavior: Calm, Cooperative, Direct eye contact, and Polite  Speech: Within normal limits  Language: Intact  Mood: Anxious  Affect: Full and Mood congruent  Perceptual disturbance (hallucinations, illusions): None  Thought process and association: Linear and coherent  Thought content (delusions, obsessions etc.): None  Insight: Fair  Judgement: Fair  Memory: WNL, although not formally assessed     EDUCATION/INTERVENTIONS:    -Completed mood check-in and reviewed the patient's current stressors and strategies to manage stress.  -Cognitive behavioral therapy  -Discussed the patient's treatment goals, revisited discussion of treatment options, and  explored goals of care.  -Continued to discuss pertinent findings with Pt's care team

## 2024-01-29 NOTE — Consults (Signed)
 Care Management  Initial Transition Planning Assessment  Type of Residence: Mailing Address:  [address]  [place] [address]  Contacts:    Patient Phone Number: [phone] (mobile)         Medical Provider(s): [facility]  Reason for Admission: Admitting Diagnosis:  Hypernatremia [E87.0]  Chronic pain syndrome [G89.4]  Generalized abdominal pain [R10.84]  Diffuse myofascial pain syndrome [M79.18]  Cancer associated pain [G89.3]  Abdominal pain, unspecified abdominal location [R10.9]  Gastrointestinal hemorrhage, unspecified gastrointestinal hemorrhage type [K92.2]  Past Medical History:   has a past medical history of Abdominal pain, Acid reflux, Anesthesia complication, Cancer    (CMS-HCC), Cataract of right eye, COVID-19 virus infection (01/2019), Cyst of thyroid determined by ultrasound, [name], Difficult intravenous access, [name] (familial adenomatous polyposis), [name] (HHS-HCC), Gastric polyps, History of chemotherapy, History of colon polyps, History of COVID-19 (01/2019), Ileus    (CMS-HCC) (03/16/2022), Iron  deficiency anemia due to chronic blood loss, PONV (postoperative nausea and vomiting), Rectal bleeding, and Syncopal episodes.  Past Surgical History:   has a past surgical history that includes desmoid removal; cystis removal; Tumor removal; pr upper gi endoscopy,biopsy (N/A, 10/27/2012); pr colonoscopy w/biopsy single/multiple (N/A, 10/27/2012); pr upper gi endoscopy,biopsy (N/A, 09/14/2013); pr colonoscopy w/biopsy single/multiple (N/A, 09/14/2013); pr upper gi endoscopy,biopsy (N/A, 11/08/2014); pr colonoscopy w/biopsy single/multiple (N/A, 11/08/2014); pr upper gi endoscopy,biopsy (N/A, 12/26/2015); pr colonoscopy w/biopsy single/multiple (N/A, 12/26/2015); pr upper gi endoscopy,biopsy (N/A, 08/27/2016); pr colsc flx w/removal lesion by hot bx forceps (N/A, 08/27/2016); pr exc skin benig >4 cm face,facial (Right, 02/25/2017); pr exc skin benig 3.1-4 cm trunk,arm,leg (Right, 02/25/2017); pr exc skin benig 2.1-3 cm trunk,arm,leg (Right, 02/25/2017); pr upper gi endoscopy,biopsy (N/A, 09/02/2017); pr colonoscopy w/biopsy single/multiple (N/A, 09/02/2017); pr colsc flx w/rmvl of tumor polyp lesion snare tq (N/A, 02/25/2019); pr exc tumor soft tissue leg/ankle subq 3+cm (Right, 08/05/2019); pr exc tumor soft tissue leg/ankle subq <3cm (Right, 08/05/2019); pr colsc flx w/rmvl of tumor polyp lesion snare tq (N/A, 03/13/2020); pr upper gi endoscopy,biopsy (N/A, 03/13/2020); cyroablation; pr lap, surg proctectomy w j-pouch (N/A, 08/10/2020); pr close enterostomy,resec+anast (N/A, 10/09/2020); pr ndsc eval intstinal pouch dx w/collj spec spx (N/A, 01/23/2021); pr unlisted procedure small intestine (01/23/2021); pr ndsc eval intstinal pouch dx w/collj spec spx (N/A, 08/27/2021); pr upper gi endoscopy,biopsy (N/A, 09/05/2021); pr ndsc eval intstinal pouch dx w/collj spec spx (N/A, 12/09/2021); pr upper gi endoscopy,diagnosis (N/A, 01/20/2022); pr ndsc eval intstinal pouch w/bx single/multiple (N/A, 01/20/2022); pr ndsc eval intstinal pouch w/bx single/multiple (N/A, 02/13/2022); pr unlisted procedure small intestine (02/13/2022); pr ndsc eval intstinal pouch dx w/collj spec spx (Left, 04/09/2022); Colon surgery; pr ndsc eval intstinal pouch dx w/collj spec spx (N/A, 08/05/2022); pr ndsc eval intstinal pouch dx w/collj spec spx (N/A, 03/13/2023); pr ndsc eval intstinal pouch w/bx single/multiple (N/A, 03/13/2023); pr ndsc eval intstinal pouch dx w/collj spec spx (N/A, 07/06/2023); pr endoscopy upper small intestine (N/A, 07/15/2023); IR Insert Port Age Greater Than 5 Years (07/20/2023); and pr ndsc eval intstinal pouch dx w/collj spec spx (N/A, 10/14/2023).   Previous admit date: 10/13/2023    Primary Insurance- Payor: BCBS / Plan: BCBS BLUE OPTIONS/PPO/ADV ([place] ONLY) / Product Type: *No Product type* /   Secondary Insurance - None  Prescription Coverage - NA  Preferred Pharmacy - [id number] [place], [place] - [address] [facility] [address]  [facility]  [facility]  [phone] [place], [place] -  [address]  [facility] [place] [facility]  [facility] [place] [facility]  [facility] - [place], [place] - [address]    Transportation home: Printmaker / Social Worker assessed the patient by : In person interview with patient, Discussion with Clinical Care team, Medical record review, In person interview with family (mother present at bedside)  Orientation Level: Oriented X4  Reason for referral: Discharge Planning          Patient lives with parents in [place], [place] [facility]) in a three level home with 19 steps present in the home.  At baseline patient requires assistance with ADLs. Patient does not receive home health. DME includes cane, rollator, w/c, shower chair, recliner, glucometer, and service dog. Mother will provide transportation and will assist with basic care at home.                Contact/Decision Maker  Extended Emergency Contact Information  Primary Emergency Contact: Evelyn,[name]   [place]  Home Phone: [phone]  Mobile Phone: [phone]  Relation: Mother  Secondary Emergency Contact: Kovar,Jeff  Mobile Phone: [phone]  Relation: Father    Legal Next of Kin / Guardian / POA / Advance Directives     HCDM (patient stated preference): Myrie,[name] - Mother - [phone]    HCDM, back-up (If primary HCDM is unavailable): Kaneshia, Cater - Father - [phone]    HCDM, back-up (If primary HCDM is unavailable): Reginae, Wolfrey - Sister - [phone]    Advance Directive (Medical Treatment)  Does patient have an advance directive covering medical treatment?: Patient does not have advance directive covering medical treatment.  Reason patient does not have an advance directive covering medical treatment:: Patient does not wish to complete one at this time.    Health Care Decision Maker [HCDM] (Medical & Mental Health Treatment)  Healthcare Decision Maker: HCDM documented in the HCDM/Contact Info section.  Information offered on HCDM, Medical & Mental Health advance directives:: Patient declined information.    Advance Directive (Mental Health Treatment)  Does patient have an advance directive covering mental health treatment?: Patient does not have advance directive covering mental health treatment.  Reason patient does not have an advance directive covering mental health treatment:: HCDM documented in the HCDM/Contact Info section.    Readmission Information    Have you been hospitalized in the last 30 days?: No                                Did the following happen with your discharge?                                                     Patient Information  Lives with: Parent    Type of Residence: Private residence        Location/Detail: [address] [place] [address]    Support Systems/Concerns: Parent    Responsibilities/Dependents at home?: No    Home Care services in place prior to admission?: No                  Equipment Currently Used at Home: glucometer, shower  chair, cane, straight, wheelchair, manual, walker, standard, other (see comments) (service dog)       Currently receiving outpatient dialysis?: No       Financial Information       Need for financial assistance?: No       Social Drivers of Health  Social Drivers of Health     Food Insecurity: No Food Insecurity (01/28/2024)    Hunger Vital Sign     Worried About Running Out of Food in the Last Year: Never true     Ran Out of Food in the Last Year: Never true   Tobacco Use: Low Risk (01/28/2024)    Patient History     Smoking Tobacco Use: Never     Smokeless Tobacco Use: Never     Passive Exposure: Past   Transportation Needs: No Transportation Needs (01/28/2024)    PRAPARE - Transportation     Lack of Transportation (Medical): No     Lack of Transportation (Non-Medical): No   Alcohol Use: Not At Risk (10/13/2023)    Alcohol Use     How often do you have a drink containing alcohol?: Never     How many drinks containing alcohol do you have on a typical day when you are drinking?: 1 - 2     How often do you have 5 or more drinks on one occasion?: Never   Housing: Low Risk (01/28/2024)    Housing     Within the past 12 months, have you ever stayed: outside, in a car, in a tent, in an overnight shelter, or temporarily in someone else's home (i.e. couch-surfing)?: No     Are you worried about losing your housing?: No   Physical Activity: Not on file   Utilities: Not At Risk (10/27/2023)    Received from [facility] Utilities     In the past 12 months has the electric, gas, oil, or water company threatened to shut off services in your home?: No   Stress: Not on file   Interpersonal Safety: Not At Risk (01/29/2024)    Interpersonal Safety     Unsafe Where You Currently Live: No     Physically Hurt by Anyone: No     Abused by Anyone: No   Substance Use: Not on file (01/12/2023)   Intimate Partner Violence: Not At Risk (10/27/2023)    Received from [facility]    Humiliation, Afraid, Rape, and Kick questionnaire     Within the last year, have you been afraid of your partner or ex-partner?: No     Within the last year, have you been humiliated or emotionally abused in other ways by your partner or ex-partner?: No     Within the last year, have you been kicked, hit, slapped, or otherwise physically hurt by your partner or ex-partner?: No     Within the last year, have you been raped or forced to have any kind of sexual activity by your partner or ex-partner?: No   Social Connections: Not on file   Financial Resource Strain: Low Risk (01/28/2024)    Overall Financial Resource Strain (CARDIA)     Difficulty of Paying Living Expenses: Not very hard   Health Literacy: Low Risk (04/11/2023)    Health Literacy     : Never   Internet Connectivity: Not on file       Complex Discharge Information    Is patient identified as a difficult/complex discharge?: No  Interventions:       Discharge Needs Assessment  Concerns to be Addressed: care coordination/care conferences, discharge planning    Clinical Risk Factors: New Diagnosis    Barriers to taking medications: No    Prior overnight hospital stay or ED visit in last 90 days: Yes              Anticipated Changes Related to Illness: other (see comments) (Likely will need time to recover to resume prior level of functioning.)    Equipment Needed After Discharge: other (see comments) (CM will follow for DME needs.)    Discharge Facility/Level of Care Needs: other (see comments) (CM will follow for DC needs.  CM will provide choice of facilities/services if deemed medically necessary.)    Readmission  Risk of Unplanned Readmission Score: UNPLANNED READMISSION SCORE: 24.49%  Predictive Model Details          24% (High)  Factor Value    Calculated 01/29/2024 12:05 25% Number of hospitalizations in last year 6    [facility] Risk of Unplanned Readmission Model 22% Number of active inpatient medication orders 38     9% Number of ED visits in last six months 2     8% ECG/EKG order present in last 6 months     7% Encounter of ten days or longer in last year present     6% Diagnosis of electrolyte disorder present     6% Imaging order present in last 6 months     5% Latest hemoglobin low (9.9 g/dL)     4% Diagnosis of deficiency anemia present     4% Active corticosteroid inpatient medication order present     2% Future appointment scheduled     2% Age 23     1% Current length of stay 1.49 days     1% Active ulcer inpatient medication order present      Readmitted Within the Last 30 Days? (No if blank)   Patient at risk for readmission?: Yes    Discharge Plan  Screen findings are: Discharge planning needs identified or anticipated (Comment). (CM will follow for DC needs.)    Expected Discharge Date: 02/01/2024    Expected Transfer from Critical Care: 01/28/24 (to Main)    Quality data for continuing care services shared with patient and/or representative?: N/A  Patient and/or family were provided with choice of facilities / services that are available and appropriate to meet post hospital care needs?: N/A       Initial Assessment complete?: Yes

## 2024-01-29 NOTE — Treatment Plan (Signed)
 [facility] TREATMENT PLAN    S/p EGD and pouchoscopy 01/29/2024:    EGD    Impression:            - Normal esophagus.                         - About 30-50 gastric polyps.                         - Normal ampulla and examined duodenum.                         - No specimens collected.    Pouchoscopy    Impression:            - The ileoanal pouch is normal.                         - Rectal cuff with congestion seen. Biopsied    Recommendations    Thankfully hemoglobin has remained stable despite episodes of hematochezia. Unlikely that syncopal episodes are correlated with GI blood loss. EGD without obvious source of bleeding. Pouchoscopy with chronic anastomotic ulceration with some blood oozing (noted on previous exams). Area of abnormal mucosa on rectal cuff, biopsied. No evidence of pouchitis. At this time feel it is reasonable for patient to pursue DBE at [facility] non urgently. Await path results of rectal cuff tissue.    [name] BROCKS Meade, MD  Gastroenterology Fellow

## 2024-01-30 LAB — CBC
HEMATOCRIT: 31.4 % — ABNORMAL LOW (ref 34.0–44.0)
HEMOGLOBIN: 10.4 g/dL — ABNORMAL LOW (ref 11.3–14.9)
MEAN CORPUSCULAR HEMOGLOBIN CONC: 33.1 g/dL (ref 32.0–36.0)
MEAN CORPUSCULAR HEMOGLOBIN: 26.7 pg (ref 25.9–32.4)
MEAN CORPUSCULAR VOLUME: 80.6 fL (ref 77.6–95.7)
MEAN PLATELET VOLUME: 10.1 fL (ref 6.8–10.7)
PLATELET COUNT: 286 10*9/L (ref 150–450)
RED BLOOD CELL COUNT: 3.89 10*12/L — ABNORMAL LOW (ref 3.95–5.13)
RED CELL DISTRIBUTION WIDTH: 15.3 % — ABNORMAL HIGH (ref 12.2–15.2)
WBC ADJUSTED: 6.9 10*9/L (ref 3.6–11.2)

## 2024-01-30 LAB — BASIC METABOLIC PANEL
ANION GAP: 10 mmol/L (ref 5–14)
BLOOD UREA NITROGEN: 10 mg/dL (ref 9–23)
BUN / CREAT RATIO: 19
CALCIUM: 8.8 mg/dL (ref 8.7–10.4)
CHLORIDE: 107 mmol/L (ref 98–107)
CO2: 27 mmol/L (ref 20.0–31.0)
CREATININE: 0.53 mg/dL — ABNORMAL LOW (ref 0.55–1.02)
EGFR CKD-EPI (2021) FEMALE: >90 mL/min/1.73m2 (ref >=60)
GLUCOSE RANDOM: 130 mg/dL (ref 70–179)
POTASSIUM: 4.7 mmol/L (ref 3.4–4.8)
SODIUM: 144 mmol/L (ref 135–145)

## 2024-01-30 LAB — MAGNESIUM: MAGNESIUM: 2.1 mg/dL (ref 1.6–2.6)

## 2024-01-30 LAB — PHOSPHORUS: PHOSPHORUS: 3 mg/dL (ref 2.4–5.1)

## 2024-01-30 MED ADMIN — acetaminophen (TYLENOL) tablet 1,000 mg: 1000 mg | ORAL | @ 23:00:00

## 2024-01-30 MED ADMIN — acetaminophen (TYLENOL) tablet 1,000 mg: 1000 mg | ORAL | @ 16:00:00

## 2024-01-30 MED ADMIN — acetaminophen (TYLENOL) tablet 1,000 mg: 1000 mg | ORAL | @ 06:00:00

## 2024-01-30 MED ADMIN — pantoprazole (Protonix) EC tablet 40 mg: 40 mg | ORAL | @ 23:00:00

## 2024-01-30 MED ADMIN — pantoprazole (Protonix) EC tablet 40 mg: 40 mg | ORAL | @ 13:00:00

## 2024-01-30 MED ADMIN — cetirizine (ZYRTEC) tablet 20 mg: 20 mg | ORAL | @ 01:00:00

## 2024-01-30 MED ADMIN — cetirizine (ZYRTEC) tablet 20 mg: 20 mg | ORAL | @ 13:00:00

## 2024-01-30 MED ADMIN — predniSONE (DELTASONE) tablet 50 mg: 50 mg | ORAL | @ 09:00:00 | Stop: 2024-01-30

## 2024-01-30 MED ADMIN — predniSONE (DELTASONE) tablet 50 mg: 50 mg | ORAL | @ 03:00:00 | Stop: 2024-01-30

## 2024-01-30 MED ADMIN — ondansetron (ZOFRAN) injection 4 mg: 4 mg | INTRAVENOUS | @ 03:00:00

## 2024-01-30 MED ADMIN — ondansetron (ZOFRAN) injection 4 mg: 4 mg | INTRAVENOUS | @ 18:00:00

## 2024-01-30 MED ADMIN — famotidine (PEPCID) tablet 20 mg: 20 mg | ORAL | @ 02:00:00

## 2024-01-30 MED ADMIN — HYDROmorphone (PF) (DILAUDID) injection 1 mg: 1 mg | INTRAVENOUS | @ 18:00:00 | Stop: 2024-02-02

## 2024-01-30 MED ADMIN — HYDROmorphone (PF) (DILAUDID) injection 1 mg: 1 mg | INTRAVENOUS | @ 22:00:00 | Stop: 2024-02-02

## 2024-01-30 MED ADMIN — HYDROmorphone (PF) (DILAUDID) injection 1 mg: 1 mg | INTRAVENOUS | @ 13:00:00 | Stop: 2024-02-02

## 2024-01-30 MED ADMIN — HYDROmorphone (PF) (DILAUDID) injection 1 mg: 1 mg | INTRAVENOUS | @ 03:00:00 | Stop: 2024-02-02

## 2024-01-30 MED ADMIN — HYDROmorphone (PF) (DILAUDID) injection 1 mg: 1 mg | INTRAVENOUS | @ 07:00:00 | Stop: 2024-02-02

## 2024-01-30 MED ADMIN — hydrocortisone (CORTEF) tablet 10 mg: 10 mg | ORAL | @ 13:00:00

## 2024-01-30 MED ADMIN — baclofen (LIORESAL) tablet 5 mg: 5 mg | ORAL | @ 13:00:00

## 2024-01-30 MED ADMIN — baclofen (LIORESAL) tablet 5 mg: 5 mg | ORAL | @ 03:00:00

## 2024-01-30 MED ADMIN — oxyCODONE (ROXICODONE) 5 mg/5 mL solution 15 mg: 15 mg | ORAL | @ 05:00:00 | Stop: 2024-02-01

## 2024-01-30 MED ADMIN — oxyCODONE (ROXICODONE) 5 mg/5 mL solution 15 mg: 15 mg | ORAL | @ 15:00:00 | Stop: 2024-02-01

## 2024-01-30 MED ADMIN — oxyCODONE (ROXICODONE) 5 mg/5 mL solution 15 mg: 15 mg | ORAL | @ 19:00:00 | Stop: 2024-02-01

## 2024-01-30 MED ADMIN — oxyCODONE (ROXICODONE) 5 mg/5 mL solution 15 mg: 15 mg | ORAL | @ 01:00:00 | Stop: 2024-02-01

## 2024-01-30 MED ADMIN — methylPREDNISolone sodium succinate (SOLU-Medrol) injection 40 mg: 40 mg | INTRAVENOUS | @ 16:00:00 | Stop: 2024-01-30

## 2024-01-30 MED ADMIN — escitalopram oxalate (LEXAPRO) tablet 5 mg: 5 mg | ORAL | @ 13:00:00

## 2024-01-30 MED ADMIN — diphenhydrAMINE (BENADRYL) injection 25 mg: 25 mg | INTRAVENOUS | @ 23:00:00 | Stop: 2024-01-31

## 2024-01-30 MED ADMIN — sodium chloride (NS) 0.9 % infusion: 20 mL/h | INTRAVENOUS | @ 17:00:00 | Stop: 2024-01-30

## 2024-01-30 MED ADMIN — diphenhydrAMINE (BENADRYL) injection: 50 mg | INTRAVENOUS | @ 16:00:00 | Stop: 2024-01-30

## 2024-01-30 MED ADMIN — diphenhydrAMINE (BENADRYL) injection: 50 mg | INTRAVENOUS | @ 20:00:00 | Stop: 2024-01-30

## 2024-01-30 MED ADMIN — sodium chloride 0.9% (NS) bolus 500 mL: 500 mL | INTRAVENOUS | @ 06:00:00 | Stop: 2024-01-30

## 2024-01-30 MED ADMIN — iron dextran (INFED) 25 mg in sodium chloride (NS) 0.9 % 25 mL IVPB: 25 mg | INTRAVENOUS | @ 17:00:00 | Stop: 2024-01-30

## 2024-01-30 MED ADMIN — famotidine (PF) (PEPCID) injection 40 mg: 40 mg | INTRAVENOUS | @ 16:00:00 | Stop: 2024-01-30

## 2024-01-30 MED ADMIN — iron dextran (INFED) 1,000 mg in sodium chloride (NS) 0.9 % 500 mL IVPB: 1000 mg | INTRAVENOUS | @ 19:00:00 | Stop: 2024-01-30

## 2024-01-30 MED ADMIN — sodium chloride (NS) 0.9 % infusion: 100 mL/h | INTRAVENOUS | @ 15:00:00 | Stop: 2024-01-30

## 2024-01-30 NOTE — Consults (Signed)
 [facility] Consult Service   Progress Note         Assessment and Recommendations:   [name] [name] is a 23 y.o. 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 The patient is seen in consultation at the request of Dan Rocher, MD ([facility])) for gastrointestinal bleeding and hematochezia.     Acute on chronic rectal bleeding  Hematochezia  Chronic IDA  H/o [name] s/p TAC  Desmoid tumors  Reported syncope  31F presenting with reported 2 weeks of worsening hematochezia with reported syncopal episodes, worsening epigastric pain, and pelvic floor pain/fullness. Patient says these symptoms are different from her chronic symptoms. Thankfully, VSS, hemoglobin stable (ranges from 8s-10s) over the last few months. Patient with known IPAA anastomotic ulceration that has been treated previously (recent upper 07/2023, pouch exam 10/2023). Query for small bowel bleed, patient referred to [facility] and evaluated for DBE (scheduled for non urgent DBE). EGD and pouchoscopy performed 11/21. EGD w/ multiple gastric polyps but otherwise normal. Pouchoscopy revealed rectal cuff abnormal mucosa that was biopsied. Discussed rectal cuff findings with advanced endoscopy colleagues and can be resected if needed pending pathology. Had long discussion with family and patient. Feel that pursuing double balloon endoscopy at [facility] would be reasonable for further workup. A tagged RBC study would likely not be high yield and would favor DBE. Patient has already had discussion of getting DBE with [facility].  -Pain regimen per primary team  -Follow up pathology, pending path can get tissue resection with advanced endoscopy team. Will follow up path and make those arrangements, would plan to do as an outpatient  -Patient can consider DBE at [facility] as previously planned     Issues Impacting Complexity of Management:  -None    Recommendations discussed with the patient's primary team. We will continue to follow along with you.    Subjective:   Patient and mother had questions about procedure yesterday. Discussed rectal cuff biopsies, path still pending. Discussed also tentative plans for double balloon study at [facility].         Objective:   Temp:  [36.7 ??C (98.1 ??F)-36.8 ??C (98.2 ??F)] (P) 36.8 ??C (98.2 ??F)  Pulse:  [57-119] (P) 75  SpO2 Pulse:  [58-117] 61  Resp:  [9-19] 16  BP: (94-120)/(44-94) (P) 103/64  SpO2:  [95 %-100 %] (P) 99 %    Gen: WDWN female in NAD, answers questions appropriately      Pertinent Labs & Studies:  -I have reviewed the patient's labs from 01/30/24 which show stable Hgb and stable renal function (SCr, electrolytes)

## 2024-01-30 NOTE — Plan of Care (Signed)
 Shift Summary  Patient is alert and oriented ?? 4. On room air. Vital signs remain at baseline. Patient independent. Power port infusing iron  dextran. Dressing clean, dry, and intact. Family supportive at bedside. Fall precautions maintained.  Pain remained severe and persistent despite administration of multiple PRN pain medications, with only a brief reduction in pain score during the afternoon.  Iron  dextran infusions and supportive medications were administered. Patient experienced mild redness and hives on chest abdomen and back during administration. PRN benadryl  given.  No falls or injuries occurred, and fall prevention strategies were maintained throughout the shift.  Skin remained intact and pressure injury prevention measures were consistently applied; overall, the patient maintained safety and skin integrity during the shift.

## 2024-01-30 NOTE — Progress Notes (Signed)
 Hospital Medicine Daily Progress Note    Assessment/Plan:    Principal Problem:    GI bleed  Active Problems:    [name] (familial adenomatous polyposis)    [name]    Dehydration    Iron  deficiency                 [name] [name] is a 23 y.o. female that presented to [facility] with GI bleed.    Acute on chronic LGI bleeding - Iron  Deficiency: Increased bleeding and pain for the past two weeks in the setting of prior colectomy and ileoanal anastamosis as below. Has known ulcers affecting her J pouch and history of prior GIBs.    -Appreciate GI evaluation, now s/p EGD and pouchoscopy 11/21  -On EGD noted multiple (30-50) gastric polyps, no specimens collected.   -On Pouchoscopy noted rectal cuff with congestion, biopsy obtained - pending results additional resection may be necessary.  No evidence of pouchitis.    -GI recommends patient pursue double balloon enteroscopy non urgently at [facility] in the future (patient has contact for this at [facility])  -Planning for IV iron  infusion on 11/22; 1gm dextran as tolerated in recent past, premed per her protocol as, patient does have a history of rash with prior infusions of ferrlecit  so will require pre-medication   -Her iron  infusion protocol per last oncology note is: cetirizine  20 mg twice daily for 48 hours prior to infusion and at least 72 hours after; prednisone  50 mg x 2 doses at 13 and 7 hours prior to infusion then methylprednisolone  40 mg IV immediately prior to infusion; diphenhydramine  50 mg IV, famotidine  40 mg IV 30 minutes prior to infusion. 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 Loris. Diagnosed at age 19, she has numerous desmoid tumors including paraspinal, chest wall, R arm/wrist, and a mesenteric/abdominal mass which have grown over time and in many cases shrunk on their own. Not on cancer directed therapy until 2024 when she trialed sorafenib  however unable to tolerate due to side effect profile. Now on nirogacestat  since 09/2022, though recently has been held due to diarrhea, bleeding (patient confirms that she is NOT actively taking at this time).  -Oncology aware of admission  -In discussion with patient left axillary u/s did not specifically address the lesion location, breast u/s obtained on 11/22 and did not show evidence of fluid collections or infection, but a 'full diagnostic evaluation' would need to be considered via Breast Imaging Department as an outpatient - will discuss with oncology     Acute on Chronic Pain: Follows with [facility] Pain management clinic. Per pain plan:  -Continue home butrans  patch  -Oxycodone  15mg  Q4hr PRN, IV dilaudid  1 mg Q4hr PRN  -Baclofen  5mg  TID     Mild adrenal insufficiency: Tentative diagnosis during last admission  -Continue Hydrocortisone  10mg  daily  -Cortisol noted 12.7 during this admission, discussed briefly with endocrine that it is still possible that patient might have mild adrenal insufficiency and that she should continue on hydrocortisone  as above and they will plan to follow her up in clinic with repeat testing as acute hospitalization is likely to impact the results     GERD: continue home famotidine  20 mg nightly     Depression/Anxiety:  -Continue home lexapro  5 mg daily   -Continue home ativan   0.5 mg PRN  -Touched base with Dr. Antonetta with psychology and she was able to follow up with patient today.  Generally doing better than in the past and Dr. Antonetta is hopeful to have her visit in the outpatient setting.  She will also be available for continued support during her admission     Prophylaxis  Ambulation     Diet  Regular diet as tolerated   ___________________________________________________________________    Subjective:  Did have several BMs yesterday and a near syncopal episode but mother was able to get her back to her bed.  Has noted some fullness and discomfort in a band like around the epigastric area.  Tolerating diet. Has a bit of a HA this AM, but thinks she is ready for her iron  infusion.  We discussed that Hgb looks largely stable from yesterday.    Labs/Studies:  Labs and Studies from the last 24hrs per EMR and Reviewed    Objective:  Temp:  [36.7 ??C (98.1 ??F)-36.8 ??C (98.2 ??F)] (P) 36.8 ??C (98.2 ??F)  Pulse:  [63-91] 91  Resp:  [16] 16  BP: (94-118)/(49-66) 118/66  SpO2:  [97 %-99 %] (P) 99 %    GEN: NAD, lying in bed, pleasant  EYES: EOMI  ENT: MMM  CV: RRR, port accessed upper right chest  PULM: CTA B  ABD: soft, tenderness to epigastric, +BS  EXT: No edema, firm nodules on arms/legs

## 2024-01-30 NOTE — Plan of Care (Signed)
 Pt A&o x4. On RA. Family at bedside. NS bolus given during the shift. PRN oxy and dilaudid  given for pain during the shift with good effect. PRN Iv zofran  given during the shift for nausea with slight relief. VSS. Pt went down for xray during the shift. Post ultrasound, pt had slight reaction on the skin, provider notified. Safety precautions maintained throughout the shift.   Shift Summary  Pain medications were administered multiple times, but pain scores remained elevated and fluctuated throughout the shift.   Comfort interventions were offered but declined, and acute pain persisted.   Fall prevention measures, including regular toileting and hourly checks, were maintained with no reported falls.   Hygiene was supported with CHG wipes, and the environment was kept safe.   Overall, the shift was marked by ongoing pain management efforts and consistent safety interventions.     Absence of Hospital-Acquired Illness or Injury: No new hospital-acquired injuries or illnesses documented during the shift; environmental safety measures and hygiene interventions such as CHG wipes were maintained throughout.     Optimal Comfort and Wellbeing: Comfort interventions were offered but consistently declined, and pain levels fluctuated with periods of increased discomfort despite administration of pain medications.     Optimal Pain Control and Function: Pain remained acute and constant, with scores ranging from 5 to 9; pain medications including oxyCODONE  and HYDROmorphone  were administered, but pain persisted and did not show sustained improvement.     Absence of Fall and Fall-Related Injury: Fall prevention strategies were consistently implemented, including regular toileting, hourly visual checks, and maintaining a safe environment, with no falls or injuries reported.

## 2024-01-31 LAB — BASIC METABOLIC PANEL
ANION GAP: 10 mmol/L (ref 5–14)
BLOOD UREA NITROGEN: 9 mg/dL (ref 9–23)
BUN / CREAT RATIO: 16
CALCIUM: 8.9 mg/dL (ref 8.7–10.4)
CHLORIDE: 108 mmol/L — ABNORMAL HIGH (ref 98–107)
CO2: 28 mmol/L (ref 20.0–31.0)
CREATININE: 0.56 mg/dL (ref 0.55–1.02)
EGFR CKD-EPI (2021) FEMALE: >90 mL/min/1.73m2 (ref >=60)
GLUCOSE RANDOM: 131 mg/dL (ref 70–179)
POTASSIUM: 3.4 mmol/L (ref 3.4–4.8)
SODIUM: 146 mmol/L — ABNORMAL HIGH (ref 135–145)

## 2024-01-31 LAB — HEPATIC FUNCTION PANEL
ALBUMIN: 3.1 g/dL — ABNORMAL LOW (ref 3.4–5.0)
ALKALINE PHOSPHATASE: 65 U/L (ref 46–116)
ALT (SGPT): 12 U/L (ref 10–49)
AST (SGOT): 10 U/L (ref <=34)
BILIRUBIN DIRECT: <0.1 mg/dL (ref 0.00–0.30)
BILIRUBIN TOTAL: <0.2 mg/dL — ABNORMAL LOW (ref 0.3–1.2)
PROTEIN TOTAL: 5.5 g/dL — ABNORMAL LOW (ref 5.7–8.2)

## 2024-01-31 LAB — CBC
HEMATOCRIT: 27.3 % — ABNORMAL LOW (ref 34.0–44.0)
HEMOGLOBIN: 9.1 g/dL — ABNORMAL LOW (ref 11.3–14.9)
MEAN CORPUSCULAR HEMOGLOBIN CONC: 33.1 g/dL (ref 32.0–36.0)
MEAN CORPUSCULAR HEMOGLOBIN: 26.8 pg (ref 25.9–32.4)
MEAN CORPUSCULAR VOLUME: 81 fL (ref 77.6–95.7)
MEAN PLATELET VOLUME: 10.1 fL (ref 6.8–10.7)
PLATELET COUNT: 238 10*9/L (ref 150–450)
RED BLOOD CELL COUNT: 3.37 10*12/L — ABNORMAL LOW (ref 3.95–5.13)
RED CELL DISTRIBUTION WIDTH: 15.4 % — ABNORMAL HIGH (ref 12.2–15.2)
WBC ADJUSTED: 12.9 10*9/L — ABNORMAL HIGH (ref 3.6–11.2)

## 2024-01-31 LAB — PHOSPHORUS: PHOSPHORUS: 3.5 mg/dL (ref 2.4–5.1)

## 2024-01-31 LAB — MAGNESIUM: MAGNESIUM: 2 mg/dL (ref 1.6–2.6)

## 2024-01-31 MED ADMIN — acetaminophen (TYLENOL) tablet 1,000 mg: 1000 mg | ORAL | @ 06:00:00

## 2024-01-31 MED ADMIN — acetaminophen (TYLENOL) tablet 1,000 mg: 1000 mg | ORAL

## 2024-01-31 MED ADMIN — acetaminophen (TYLENOL) tablet 1,000 mg: 1000 mg | ORAL | @ 16:00:00

## 2024-01-31 MED ADMIN — pantoprazole (Protonix) EC tablet 40 mg: 40 mg | ORAL | @ 13:00:00

## 2024-01-31 MED ADMIN — pantoprazole (Protonix) EC tablet 40 mg: 40 mg | ORAL

## 2024-01-31 MED ADMIN — cetirizine (ZYRTEC) tablet 20 mg: 20 mg | ORAL | @ 02:00:00

## 2024-01-31 MED ADMIN — cetirizine (ZYRTEC) tablet 20 mg: 20 mg | ORAL | @ 13:00:00

## 2024-01-31 MED ADMIN — ondansetron (ZOFRAN) injection 4 mg: 4 mg | INTRAVENOUS | @ 13:00:00

## 2024-01-31 MED ADMIN — HYDROmorphone (PF) (DILAUDID) injection 1 mg: 1 mg | INTRAVENOUS | @ 20:00:00 | Stop: 2024-02-05

## 2024-01-31 MED ADMIN — HYDROmorphone (PF) (DILAUDID) injection 1 mg: 1 mg | INTRAVENOUS | @ 06:00:00 | Stop: 2024-02-05

## 2024-01-31 MED ADMIN — HYDROmorphone (PF) (DILAUDID) injection 1 mg: 1 mg | INTRAVENOUS | @ 13:00:00 | Stop: 2024-02-05

## 2024-01-31 MED ADMIN — HYDROmorphone (PF) (DILAUDID) injection 1 mg: 1 mg | INTRAVENOUS | @ 02:00:00 | Stop: 2024-02-02

## 2024-01-31 MED ADMIN — oxyCODONE (ROXICODONE) 5 mg/5 mL solution 15 mg: 15 mg | ORAL | @ 16:00:00 | Stop: 2024-01-31

## 2024-01-31 MED ADMIN — oxyCODONE (ROXICODONE) 5 mg/5 mL solution 15 mg: 15 mg | ORAL | @ 09:00:00 | Stop: 2024-01-31

## 2024-01-31 MED ADMIN — oxyCODONE (ROXICODONE) 5 mg/5 mL solution 15 mg: 15 mg | ORAL | Stop: 2024-01-31

## 2024-01-31 MED ADMIN — oxyCODONE (ROXICODONE) 5 mg/5 mL solution 15 mg: 15 mg | ORAL | Stop: 2024-02-01

## 2024-01-31 MED ADMIN — oxyCODONE (ROXICODONE) 5 mg/5 mL solution 15 mg: 15 mg | ORAL | @ 05:00:00 | Stop: 2024-02-01

## 2024-01-31 MED ADMIN — escitalopram oxalate (LEXAPRO) tablet 5 mg: 5 mg | ORAL | @ 13:00:00

## 2024-01-31 MED ADMIN — scopolamine (TRANSDERM-SCOP) 1 mg over 3 days topical patch 1 mg: 1 | TOPICAL | @ 06:00:00

## 2024-01-31 MED ADMIN — diphenhydrAMINE (BENADRYL) injection 25 mg: 25 mg | INTRAVENOUS | @ 03:00:00 | Stop: 2024-01-31

## 2024-01-31 MED ADMIN — diphenhydrAMINE (BENADRYL) injection: 25 mg | INTRAVENOUS | @ 05:00:00 | Stop: 2024-01-30

## 2024-01-31 MED ADMIN — famotidine (PF) (PEPCID) injection 20 mg: 20 mg | INTRAVENOUS | @ 17:00:00 | Stop: 2024-01-31

## 2024-01-31 MED ADMIN — diphenhydrAMINE (BENADRYL) injection: 25 mg | INTRAVENOUS | @ 20:00:00 | Stop: 2024-01-31

## 2024-01-31 MED ADMIN — diphenhydrAMINE (BENADRYL) injection: 25 mg | INTRAVENOUS | @ 22:00:00 | Stop: 2024-01-31

## 2024-01-31 MED ADMIN — dextrose 5 % and sodium chloride 0.45 % with KCl 40 mEq/L infusion: 100 mL/h | INTRAVENOUS | @ 20:00:00 | Stop: 2024-02-01

## 2024-01-31 MED ADMIN — simethicone (MYLICON) chewable tablet 80 mg: 80 mg | ORAL | @ 02:00:00

## 2024-01-31 MED ADMIN — diphenhydrAMINE (BENADRYL) injection: 25 mg | INTRAVENOUS | @ 08:00:00 | Stop: 2024-01-31

## 2024-01-31 MED ADMIN — diphenhydrAMINE (BENADRYL) injection: 25 mg | INTRAVENOUS | @ 14:00:00 | Stop: 2024-01-31

## 2024-01-31 MED ADMIN — diphenhydrAMINE (BENADRYL) injection: 25 mg | INTRAVENOUS | @ 17:00:00 | Stop: 2024-01-31

## 2024-01-31 NOTE — Plan of Care (Signed)
 Shift Summary  A & O x4, mom at bedside. Port to right chest remained intact and without signs of infiltration. She complained of redness/flushing to face/chest/abdomen after the infed  completed requiring multiple doses of benadryl . Also complained of swelling to joints after infed , which was improved after benadryl . BM x 2 during the night, per patient with blood although I did not see it before she flushed  Multiple PRN pain medications (HYDROmorphone  and oxyCODONE ) were administered throughout the shift, but pain scores remained high and patient continued to request additional pain relief .   Diphenhydramine  was given several times for allergy  symptoms, redness and burning skin from infed .  Fall prevention strategies were maintained at all times, and no falls or injuries occurred.   Skin integrity was supported with pressure reduction and protection measures, and a torn mole was treated with a protective dressing.   Patient remained in bed with family present, and overall status was unchanged with persistent pain and ongoing supportive interventions.

## 2024-01-31 NOTE — Plan of Care (Signed)
 Shift Summary  Patient is alert and oriented ?? 4. On room air. Vital signs remain at baseline. Patient independent. Power port infusing IVF per order. Dressing clean, dry, and intact. Family supportive at bedside. Fall precautions maintained.  Patient had 1 bloody BM during this shift. MD Marucci made aware.  PRN pain medications including HYDROmorphone  and oxyCODONE  were administered multiple times, but pain scores remained elevated and the patient declined some interventions.  Fall prevention and safety interventions were consistently applied, with no falls or injuries documented during the shift.  Skin integrity was supported with pressure reduction techniques and devices, and redness/rash was noted but did not progress to further breakdown.  Several medications were refused, including baclofen  and hydrocortisone , while diphenhydrAMINE  and ondansetron  were given as needed for symptom management.  Patient remained in bed for most of the shift, with ongoing abdominal pain and stable skin condition.    Optimal Pain Control and Function: Abdominal pain remained constant and chronic throughout the shift, with pain scores fluctuating between 6 and 9; multiple PRN pain medications were administered, but pain was not significantly reduced, and interventions were declined at times. Pain descriptors shifted from aching and burning to include pulling and tugging, with no overall change in clinical progression by end of shift.    Absence of Fall and Fall-Related Injury: Fall prevention strategies were consistently implemented, including environmental modifications, low bed positioning, and frequent hourly visual checks, with no use of bed/chair alarms or safety devices for transfers required as patient remained in bed and awake for most of the shift.    Skin Health and Integrity: Skin protection measures such as limited adhesive use, frequent weight shifts, heel elevation, and a pressure-redistributing mattress were maintained; redness and rash were noted in the morning, but no further skin breakdown was documented during the shift.

## 2024-01-31 NOTE — Progress Notes (Signed)
 Hospital Medicine Daily Progress Note    Assessment/Plan:    Principal Problem:    GI bleed  Active Problems:    [name] (familial adenomatous polyposis)    [name]    Dehydration    Iron  deficiency                 [name] [name] is a 23 y.o. female that presented to [facility] with GI bleed.    Acute on chronic LGI bleeding - Iron  Deficiency: Increased bleeding and pain for the past two weeks in the setting of prior colectomy and ileoanal anastamosis as below. Has known ulcers affecting her J pouch and history of prior GIBs.    -Appreciate GI evaluation, now s/p EGD and pouchoscopy 11/21  -On EGD noted multiple (30-50) gastric polyps, no specimens collected.   -On Pouchoscopy noted rectal cuff with congestion, biopsy obtained - pending results additional resection may be necessary.  No evidence of pouchitis.    -GI recommends patient pursue double balloon enteroscopy non urgently at [facility] in the future (patient has contact for this at [facility])  -s/p IV iron  infusion on 11/22; 1gm dextran as tolerated in recent past, premed per her protocol  -Her iron  infusion protocol per last oncology note is: cetirizine  20 mg twice daily for 48 hours prior to infusion and at least 72 hours after; prednisone  50 mg x 2 doses at 13 and 7 hours prior to infusion then methylprednisolone  40 mg IV immediately prior to infusion; diphenhydramine  50 mg IV, famotidine  40 mg IV 30 minutes prior to infusion. Then give iron  dextran 1 g.  Also requires ongoing IV benadryl  if issues with ongoing rashes and has previous required additional IV steroids.  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 Loris. Diagnosed at age 65, she has numerous desmoid tumors including paraspinal, chest wall, R arm/wrist, and a mesenteric/abdominal mass which have grown over time and in many cases shrunk on their own. Not on cancer directed therapy until 2024 when she trialed sorafenib  however unable to tolerate due to side effect profile. Now on nirogacestat  since 09/2022, though recently has been held due to diarrhea, bleeding (patient confirms that she is NOT actively taking at this time).  -Oncology aware of admission  -IBreast u/s obtained on 11/22 and did not show evidence of fluid collections or infection, but a 'full diagnostic evaluation' would need to be considered via Breast Imaging Department as an outpatient - will discuss with oncology as this will need to be arranged in the outpatient setting     Acute on Chronic Pain: Follows with [facility] Pain management clinic. Per pain plan:  -Continue home butrans  patch  -Oxycodone  20mg  Q4hr PRN, IV dilaudid  1 mg Q4hr PRN  -Baclofen  5mg  TID     Mild adrenal insufficiency: Tentative diagnosis during last admission  -Continue Hydrocortisone  10mg  daily  -Cortisol noted 12.7 during this admission, discussed briefly with endocrine that it is still possible that patient might have mild adrenal insufficiency and that she should continue on hydrocortisone  as above and they will plan to follow her up in clinic with repeat testing as acute hospitalization is likely to impact the results     GERD: continue home famotidine   20 mg nightly     Depression/Anxiety:  -Continue home lexapro  5 mg daily   -Continue home ativan  0.5 mg PRN  -Touched base with Dr. Antonetta with psychology and she was able to follow up with patient on 11/21.  Generally doing better than in the past and Dr. Antonetta is hopeful to have her visit in the outpatient setting.  She will also be available for continued support during her admission     Prophylaxis  Ambulation     Diet  Regular diet as tolerated   ___________________________________________________________________    Subjective:  Continues to have redness and intermittent ongoing hives and skin particular around edges of face feels like it's on fire.  Improved a little with IV benadryl  and IV ranitidine.  Patient indicates that in the past she has sometimes needed an additional IV solumedrol dose to help control but isn't sure that we should initiate this yet.  We discussed how we can work to start to decrease opioid use given previous history of narcotic bowel syndrome, visceral hyperalgesia, and generalized opioid-induced hyperalgesia.  Patient is hopeful to be able to start to titrate off opioids back to home dosing in the next 24-48hrs thinking that d/c might be likely Tuesday.    Continues to feel this fullness just above umbilical area that is in band-like across her entire abdomen.    Labs/Studies:  Labs and Studies from the last 24hrs per EMR and Reviewed    Objective:  Temp:  [36.7 ??C (98.1 ??F)-36.9 ??C (98.4 ??F)] 36.8 ??C (98.2 ??F)  Pulse:  [60-90] 72  Resp:  [16] 16  BP: (99-107)/(42-64) 104/64  SpO2:  [98 %-100 %] 98 %    GEN: NAD, lying in bed, pleasant  EYES: EOMI  ENT: MMM; face does appear swollen today in comparison to yesterday  CV: RRR, port accessed upper right chest  PULM: CTA B  ABD: soft, tenderness to epigastric, +BS  EXT: No edema, firm nodules on arms/legs  SKIN: Various areas of redness on chest, abd, arms, legs, face

## 2024-02-01 LAB — CBC
HEMATOCRIT: 31.4 % — ABNORMAL LOW (ref 34.0–44.0)
HEMOGLOBIN: 10.2 g/dL — ABNORMAL LOW (ref 11.3–14.9)
MEAN CORPUSCULAR HEMOGLOBIN CONC: 32.7 g/dL (ref 32.0–36.0)
MEAN CORPUSCULAR HEMOGLOBIN: 26.7 pg (ref 25.9–32.4)
MEAN CORPUSCULAR VOLUME: 81.6 fL (ref 77.6–95.7)
MEAN PLATELET VOLUME: 10.1 fL (ref 6.8–10.7)
PLATELET COUNT: 261 10*9/L (ref 150–450)
RED BLOOD CELL COUNT: 3.84 10*12/L — ABNORMAL LOW (ref 3.95–5.13)
RED CELL DISTRIBUTION WIDTH: 15.7 % — ABNORMAL HIGH (ref 12.2–15.2)
WBC ADJUSTED: 10.9 10*9/L (ref 3.6–11.2)

## 2024-02-01 LAB — BASIC METABOLIC PANEL
ANION GAP: 11 mmol/L (ref 5–14)
BLOOD UREA NITROGEN: 6 mg/dL — ABNORMAL LOW (ref 9–23)
BUN / CREAT RATIO: 10
CALCIUM: 9 mg/dL (ref 8.7–10.4)
CHLORIDE: 105 mmol/L (ref 98–107)
CO2: 27 mmol/L (ref 20.0–31.0)
CREATININE: 0.6 mg/dL (ref 0.55–1.02)
EGFR CKD-EPI (2021) FEMALE: >90 mL/min/1.73m2 (ref >=60)
GLUCOSE RANDOM: 157 mg/dL (ref 70–179)
POTASSIUM: 4.1 mmol/L (ref 3.4–4.8)
SODIUM: 143 mmol/L (ref 135–145)

## 2024-02-01 LAB — MAGNESIUM: MAGNESIUM: 1.9 mg/dL (ref 1.6–2.6)

## 2024-02-01 LAB — HEPATIC FUNCTION PANEL
ALBUMIN: 3.5 g/dL (ref 3.4–5.0)
ALKALINE PHOSPHATASE: 75 U/L (ref 46–116)
ALT (SGPT): 10 U/L (ref 10–49)
AST (SGOT): 12 U/L (ref <=34)
BILIRUBIN DIRECT: <0.1 mg/dL (ref 0.00–0.30)
BILIRUBIN TOTAL: <0.2 mg/dL — ABNORMAL LOW (ref 0.3–1.2)
PROTEIN TOTAL: 6.1 g/dL (ref 5.7–8.2)

## 2024-02-01 LAB — PHOSPHORUS: PHOSPHORUS: 3.3 mg/dL (ref 2.4–5.1)

## 2024-02-01 MED ADMIN — acetaminophen (TYLENOL) tablet 1,000 mg: 1000 mg | ORAL | @ 14:00:00

## 2024-02-01 MED ADMIN — acetaminophen (TYLENOL) tablet 1,000 mg: 1000 mg | ORAL | @ 08:00:00

## 2024-02-01 MED ADMIN — acetaminophen (TYLENOL) tablet 1,000 mg: 1000 mg | ORAL | @ 23:00:00

## 2024-02-01 MED ADMIN — pantoprazole (Protonix) EC tablet 40 mg: 40 mg | ORAL | @ 23:00:00

## 2024-02-01 MED ADMIN — pantoprazole (Protonix) EC tablet 40 mg: 40 mg | ORAL | @ 14:00:00

## 2024-02-01 MED ADMIN — cetirizine (ZYRTEC) tablet 20 mg: 20 mg | ORAL | @ 02:00:00

## 2024-02-01 MED ADMIN — cetirizine (ZYRTEC) tablet 20 mg: 20 mg | ORAL | @ 14:00:00

## 2024-02-01 MED ADMIN — ondansetron (ZOFRAN) injection 4 mg: 4 mg | INTRAVENOUS | @ 15:00:00

## 2024-02-01 MED ADMIN — HYDROmorphone (PF) (DILAUDID) injection 1 mg: 1 mg | INTRAVENOUS | @ 05:00:00 | Stop: 2024-02-05

## 2024-02-01 MED ADMIN — HYDROmorphone (PF) (DILAUDID) injection 1 mg: 1 mg | INTRAVENOUS | @ 09:00:00 | Stop: 2024-02-05

## 2024-02-01 MED ADMIN — HYDROmorphone (PF) (DILAUDID) injection 1 mg: 1 mg | INTRAVENOUS | @ 13:00:00 | Stop: 2024-02-05

## 2024-02-01 MED ADMIN — HYDROmorphone (PF) (DILAUDID) injection 1 mg: 1 mg | INTRAVENOUS | @ 01:00:00 | Stop: 2024-02-05

## 2024-02-01 MED ADMIN — HYDROmorphone (PF) (DILAUDID) injection 1 mg: 1 mg | INTRAVENOUS | @ 21:00:00 | Stop: 2024-02-05

## 2024-02-01 MED ADMIN — HYDROmorphone (PF) (DILAUDID) injection 1 mg: 1 mg | INTRAVENOUS | @ 17:00:00 | Stop: 2024-02-05

## 2024-02-01 MED ADMIN — hydrocortisone (CORTEF) tablet 10 mg: 10 mg | ORAL | @ 14:00:00

## 2024-02-01 MED ADMIN — escitalopram oxalate (LEXAPRO) tablet 5 mg: 5 mg | ORAL | @ 14:00:00

## 2024-02-01 MED ADMIN — diphenhydrAMINE (BENADRYL) injection: 50 mg | INTRAVENOUS | @ 11:00:00 | Stop: 2024-02-01

## 2024-02-01 MED ADMIN — diphenhydrAMINE (BENADRYL) injection: 25 mg | INTRAVENOUS | @ 05:00:00 | Stop: 2024-02-01

## 2024-02-01 MED ADMIN — diphenhydrAMINE (BENADRYL) injection: 25 mg | INTRAVENOUS | @ 09:00:00 | Stop: 2024-02-01

## 2024-02-01 MED ADMIN — diphenhydrAMINE (BENADRYL) injection: 25 mg | INTRAVENOUS | @ 01:00:00 | Stop: 2024-02-01

## 2024-02-01 MED ADMIN — oxyCODONE (ROXICODONE) 5 mg/5 mL solution 20 mg: 20 mg | ORAL | @ 23:00:00 | Stop: 2024-02-02

## 2024-02-01 MED ADMIN — oxyCODONE (ROXICODONE) 5 mg/5 mL solution 20 mg: 20 mg | ORAL | @ 18:00:00 | Stop: 2024-02-02

## 2024-02-01 MED ADMIN — oxyCODONE (ROXICODONE) 5 mg/5 mL solution 20 mg: 20 mg | ORAL | @ 08:00:00 | Stop: 2024-02-02

## 2024-02-01 MED ADMIN — oxyCODONE (ROXICODONE) 5 mg/5 mL solution 20 mg: 20 mg | ORAL | @ 14:00:00 | Stop: 2024-02-02

## 2024-02-01 MED ADMIN — oxyCODONE (ROXICODONE) 5 mg/5 mL solution 20 mg: 20 mg | ORAL | @ 04:00:00 | Stop: 2024-02-02

## 2024-02-01 MED ADMIN — methylPREDNISolone sodium succinate (SOLU-Medrol) injection 40 mg: 40 mg | INTRAVENOUS | @ 04:00:00 | Stop: 2024-01-31

## 2024-02-01 NOTE — Progress Notes (Addendum)
 Daily Progress Note    Assessment/Plan:    [name] [name] is a 23 y.o. female that presented to [facility] with GI bleed.    Acute on chronic LGI bleeding - Iron  Deficiency: Increased bleeding and pain for the past two weeks in the setting of prior colectomy and ileoanal anastamosis as below. Has known ulcers affecting her J pouch and history of prior GIBs.EGD and pouchoscopy 11/21 noted multiple (30-50) gastric polyps, no specimens collected and no evidence of pouchitis.  GI recommends patient pursue double balloon enteroscopy non urgently at [facility] in the future (patient has contact for this at [facility]). Hgb has remained stable and biopsy with no high grade dysplasia identified.   -s/p IV iron  infusion on 11/22 with signs of infusion reaction with hyperemia and swelling in multiple areas . Now improved after receiving steroids on 11/23.   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 Loris.Most recently trialed on nirogacestat  though recently has been held due to diarrhea, bleeding   -Oncology aware of admission  -IBreast u/s obtained on 11/22 and did not show evidence of fluid collections or infection, but a 'full diagnostic evaluation' would need to be considered via Breast Imaging Department as an outpatient - will discuss with oncology as this will need to be arranged in the outpatient setting     Acute on Chronic Pain: Follows with [facility] Pain management clinic. Per pain plan:  -Continue home butrans  patch  -Oxycodone  20mg  Q4hr PRN, IV dilaudid  1 mg Q4hr PRN  -Baclofen  5mg  TID     Mild adrenal insufficiency: Tentative diagnosis during last admission  -Continue Hydrocortisone  10mg  daily     GERD: continue home famotidine  20 mg nightly     Depression/Anxiety:  -Continue home lexapro  5 mg daily   -Continue home ativan  0.5 mg PRN    Disposition: Discharge likely tomorrow if no further infusion reactions Medically Clear: No    DVT Prophylaxis  Med Administrations and Associated Flowsheet Values (last 24 hours)       None          Peripheral Vascular Interventions (last 24 hours):  Anti-Embolism Device Status: Other (Comment) (low risk)       I personally spent greater than 55 minutes face-to-face and non-face-to-face in the care of this patient, which includes all pre, intra, and post visit time on the date of service.  All documented time was specific to the E/M visit and does not include any procedures that may have been performed.  __________________________________________________________________    Subjective:    No acute events overnight. Reports that swelling and redness has improved in multiple areas after receiving steroids overnight. Continues to have some darker colored stool although Hgb has remained stable. Hoping to take in more fluids today off IVF.     Objective    Temp:  [36.7 ??C (98.1 ??F)-36.8 ??C (98.3 ??F)] 36.7 ??C (98.1 ??F)  Pulse:  [64-89] 71  Resp:  [18] 18  BP: (104-126)/(56-82) 111/63  SpO2:  [96 %-100 %] 98 %    Chronically ill appearing female sitting upright in NAD   MMM  RRR without murmurs . Small area or erythema over left chest   Lugns CTA bilaterally   Abdomen soft nontender  No LE edema     Recent Results (from the past 24 hours)   ECG 12 Lead    Collection Time:  01/31/24  3:05 PM   Result Value Ref Range    EKG Systolic BP  mmHg    EKG Diastolic BP  mmHg    EKG Ventricular Rate 72 BPM    EKG Atrial Rate 72 BPM    EKG P-R Interval 136 ms    EKG QRS Duration 86 ms    EKG Q-T Interval 362 ms    EKG QTC Calculation 396 ms    EKG Calculated P Axis 43 degrees    EKG Calculated R Axis 46 degrees    EKG Calculated T Axis 31 degrees    QTC Fredericia 384 ms   Basic Metabolic Panel    Collection Time: 02/01/24  3:39 AM   Result Value Ref Range    Sodium 143 135 - 145 mmol/L    Potassium 4.1 3.4 - 4.8 mmol/L    Chloride 105 98 - 107 mmol/L    CO2 27.0 20.0 - 31.0 mmol/L    Anion Gap 11 5 - 14 mmol/L    BUN 6 (L) 9 - 23 mg/dL    Creatinine 9.39 9.44 - 1.02 mg/dL    BUN/Creatinine Ratio 10     eGFR CKD-EPI (2021) Female >90 >=60 mL/min/1.40m2    Glucose 157 70 - 179 mg/dL    Calcium 9.0 8.7 - 89.5 mg/dL   CBC    Collection Time: 02/01/24  3:39 AM   Result Value Ref Range    WBC 10.9 3.6 - 11.2 10*9/L    RBC 3.84 (L) 3.95 - 5.13 10*12/L    HGB 10.2 (L) 11.3 - 14.9 g/dL    HCT 68.5 (L) 65.9 - 44.0 %    MCV 81.6 77.6 - 95.7 fL    MCH 26.7 25.9 - 32.4 pg    MCHC 32.7 32.0 - 36.0 g/dL    RDW 84.2 (H) 87.7 - 15.2 %    MPV 10.1 6.8 - 10.7 fL    Platelet 261 150 - 450 10*9/L   Magnesium  Level    Collection Time: 02/01/24  3:39 AM   Result Value Ref Range    Magnesium  1.9 1.6 - 2.6 mg/dL   Phosphorus Level    Collection Time: 02/01/24  3:39 AM   Result Value Ref Range    Phosphorus 3.3 2.4 - 5.1 mg/dL   Hepatic Function Panel    Collection Time: 02/01/24  3:39 AM   Result Value Ref Range    Albumin 3.5 3.4 - 5.0 g/dL    Total Protein 6.1 5.7 - 8.2 g/dL    Total Bilirubin <9.7 (L) 0.3 - 1.2 mg/dL    Bilirubin, Direct <9.89 0.00 - 0.30 mg/dL    AST 12 <=65 U/L    ALT 10 10 - 49 U/L    Alkaline Phosphatase 75 46 - 116 U/L

## 2024-02-01 NOTE — Progress Notes (Signed)
 [facility]   Progress Note         Assessment and Recommendations:   [name] [name] is a 23 y.o. 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 The patient is seen in consultation at the request of Dan Rocher, MD ([facility])) for gastrointestinal bleeding and hematochezia.     Acute on chronic rectal bleeding, improving  Hematochezia  Chronic IDA  H/o [name] s/p TAC  Desmoid tumors  Reported syncope  Discussed pathology findings of rectal cuff adenoma. Will refer patient for evaluation/resection. Patient amenable with plan. Still endorses band-like abdominal pain that she says is atypical. Reassured by normal appearing EGD, recent stable CTAP, and normal LFTs.    - Referral to [facility] GI advanced resection team  - Pain regimen per primary team  - Recommend pursuing DBE at [facility] as previously discussed    Issues Impacting Complexity of Management:  -None    Recommendations discussed with the patient's primary team. We will continue to follow along with you.    Subjective:     This afternoon patient sitting in bed, mother was not present but was called and arrived during the update from our team. Patient amenable with plan to be referred for outpatient evaluation for resection of rectal cuff adenoma.    Objective:   Temp:  [36.4 ??C (97.6 ??F)-36.8 ??C (98.3 ??F)] 36.4 ??C (97.6 ??F)  Pulse:  [64-91] 91  Resp:  [18] 18  BP: (104-126)/(56-82) 116/74  SpO2:  [96 %-100 %] 98 %    Gen: WDWN female in NAD, answers questions appropriately    Pertinent Labs & Studies:  -I have reviewed the patient's labs from 02/01/24 which show stable Hgb and stable renal function (SCr, electrolytes)

## 2024-02-01 NOTE — Plan of Care (Signed)
 PT admitted for GI bleed. Pt is alert and oriented x4, room air, vss. Pt rcvd prn pain and nausea meds this shift. Pt worked with PT. Pt ambulates, voids independently, had multiple bm. Pt port assessed and flushed per protocol. Pt refused baclofen  this shift. Pt has bed in lowest position, call light within reach, bed alarm off, family at bedside.  Shift Summary  Pain management was attempted with HYDROmorphone  and oxyCODONE , but pain scores remained elevated and patient declined some interventions.   Fall prevention strategies were consistently implemented, and ambulation outside the room occurred without incident.   Skin integrity was supported by frequent repositioning and a pressure-redistributing mattress, with no skin breakdown noted.   Infection control measures were maintained during the shift.   Overall, pain control was challenging, but safety and skin integrity goals were met.    Optimal Pain Control and Function: Abdominal pain persisted throughout the shift, with pain scores fluctuating between 6 and 9; HYDROmorphone  and oxyCODONE  were administered PRN, but pain relief  was limited, and patient declined baclofen  and some interventions multiple times.    Absence of Fall and Fall-Related Injury: Fall reduction program and low bed were maintained, hourly visual checks were performed, and no falls or injuries occurred during ambulation outside the room.    Skin Health and Integrity: Braden Scale score remained high, frequent weight shifts and pressure-redistributing mattress were utilized, and integumentary assessment was within defined limits.

## 2024-02-01 NOTE — Consults (Addendum)
 [facility])   Consultation - Liaison (CL) Psychiatry  CL Psychology Follow-Up Note      Service Date:  02/01/24  Admit Date:  01/28/24  Clinician:  Olam Littles, PsyD  Intervention: 30 min Individual Psychotherapy  Face-to-Face Clinical Contact with Patient: 30 min  Pre-/Post-Encounter Activities: 30 min  Method of Interaction: In-Person    BACKGROUND INFORMATION AND REASON FOR REFERRAL:  Please see H&P for full details. 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 She was admitted to the Hospitalist service for management of the concerns above. Pt is well-known to clinical research associate from previous admissions. Please see prior notes for further details on psychosocial history. Psychology consulted to address psychological factors affecting Pt's medical condition.     Interval events:  -No significant adverse behavioral/psychiatric events since last seen by Psychology     ASSESSMENT  IMPRESSIONS/SUMMARY    At follow-up, Pt reported improvements in anxiety symptoms. Session topics included cognitive and behavioral approaches to address anxiety and enhance psychological flexibility. Pt actively engaged and receptive to intervention.     DIAGNOSTIC IMPRESSIONS    Major depressive disorder, recurrent episode, moderate  Generalized anxiety disorder  Posttraumatic stress disorder    Risk Assessment:  ASQ screening result: no intervention is necessary    -A full risk assessment was previously performed on 11/21.  Risk assessment remains essentially unchanged.    Current suicide risk: low risk  Current homicide risk: low risk      PLAN  RECOMMENDATIONS    ## Safety and Observation Level:   -This patient is not currently under IVC. If safety concerns arise, please page psychiatry for an evaluation. Recommend routine level of observation per primary team.    ## Follow-up:  - The patient desires ongoing follow-up from CL Psychology while medically inpatient.    ## Disposition:  -There are no psychological contraindications to discharging this patient when medically appropriate.   -Pt interested in scheduling outpatient therapy appointment with writer. Appointment coordinated for 12/11 at 11AM.  - When this patient is discharged, please ensure that their AVS includes information about the 5 Suicide & Crisis Lifeline.    ## Behavioral/environmental:   -Please adhere to delirium (prevention) protocol.   -Efforts to encourage normal circadian function are recommended.  Specifically, exposure to natural light during the day, minimization of light at night, consistent/routine times for sleep/wake, and avoiding of daytime naps (if possible) may be helpful.     Olam GORMAN Littles, PsyD  Clinical Psychologist       SUBJECTIVE  SESSION CONTENT     Session began with a brief check-in. Pt reported improvements in anxiety and expressed feeling good about current plan of care. We explored cognitive and behavioral approaches to address anxiety and enhance psychological flexibility. Pt oriented to strengths and adaptive coping efforts. Pt denied additional concerns to address today.     OBJECTIVE   MENTAL STATUS     Orientation and consciousness: AOx4   Appearance: Appears stated age  Behavior: Calm, Cooperative, Direct eye contact, and Polite  Speech: Within normal limits  Language: Intact  Mood: Anxious  Affect: Full and Mood congruent  Perceptual disturbance (hallucinations, illusions): None  Thought process and association: Linear and coherent  Thought content (delusions, obsessions etc.): None  Insight: Fair  Judgement: Fair  Memory: WNL, although not formally assessed  EDUCATION/INTERVENTIONS:    -Completed mood check-in and reviewed the patient's current stressors and strategies to manage stress.  -Cognitive behavioral therapy  -Discussed the patient's treatment goals, revisited discussion of treatment options, and  explored goals of care.  -Continued to discuss pertinent findings with Pt's care team

## 2024-02-02 LAB — BASIC METABOLIC PANEL
ANION GAP: 9 mmol/L (ref 5–14)
BLOOD UREA NITROGEN: 13 mg/dL (ref 9–23)
BUN / CREAT RATIO: 22
CALCIUM: 8.5 mg/dL — ABNORMAL LOW (ref 8.7–10.4)
CHLORIDE: 104 mmol/L (ref 98–107)
CO2: 31 mmol/L (ref 20.0–31.0)
CREATININE: 0.58 mg/dL (ref 0.55–1.02)
EGFR CKD-EPI (2021) FEMALE: >90 mL/min/1.73m2 (ref >=60)
GLUCOSE RANDOM: 92 mg/dL (ref 70–179)
POTASSIUM: 3.6 mmol/L (ref 3.4–4.8)
SODIUM: 144 mmol/L (ref 135–145)

## 2024-02-02 LAB — CBC
HEMATOCRIT: 28.2 % — ABNORMAL LOW (ref 34.0–44.0)
HEMOGLOBIN: 9.3 g/dL — ABNORMAL LOW (ref 11.3–14.9)
MEAN CORPUSCULAR HEMOGLOBIN CONC: 32.9 g/dL (ref 32.0–36.0)
MEAN CORPUSCULAR HEMOGLOBIN: 26.7 pg (ref 25.9–32.4)
MEAN CORPUSCULAR VOLUME: 80.9 fL (ref 77.6–95.7)
MEAN PLATELET VOLUME: 9.7 fL (ref 6.8–10.7)
PLATELET COUNT: 243 10*9/L (ref 150–450)
RED BLOOD CELL COUNT: 3.48 10*12/L — ABNORMAL LOW (ref 3.95–5.13)
RED CELL DISTRIBUTION WIDTH: 15.3 % — ABNORMAL HIGH (ref 12.2–15.2)
WBC ADJUSTED: 7.8 10*9/L (ref 3.6–11.2)

## 2024-02-02 LAB — HEMOGLOBIN AND HEMATOCRIT, BLOOD
HEMATOCRIT: 29.5 % — ABNORMAL LOW (ref 34.0–44.0)
HEMOGLOBIN: 9.5 g/dL — ABNORMAL LOW (ref 11.3–14.9)

## 2024-02-02 MED ADMIN — acetaminophen (TYLENOL) tablet 1,000 mg: 1000 mg | ORAL | @ 22:00:00

## 2024-02-02 MED ADMIN — acetaminophen (TYLENOL) tablet 1,000 mg: 1000 mg | ORAL | @ 07:00:00

## 2024-02-02 MED ADMIN — acetaminophen (TYLENOL) tablet 1,000 mg: 1000 mg | ORAL | @ 15:00:00

## 2024-02-02 MED ADMIN — pantoprazole (Protonix) EC tablet 40 mg: 40 mg | ORAL | @ 22:00:00

## 2024-02-02 MED ADMIN — pantoprazole (Protonix) EC tablet 40 mg: 40 mg | ORAL | @ 13:00:00

## 2024-02-02 MED ADMIN — cetirizine (ZYRTEC) tablet 20 mg: 20 mg | ORAL | @ 13:00:00

## 2024-02-02 MED ADMIN — cetirizine (ZYRTEC) tablet 20 mg: 20 mg | ORAL | @ 02:00:00

## 2024-02-02 MED ADMIN — ondansetron (ZOFRAN) injection 4 mg: 4 mg | INTRAVENOUS | @ 02:00:00

## 2024-02-02 MED ADMIN — ondansetron (ZOFRAN) injection 4 mg: 4 mg | INTRAVENOUS | @ 10:00:00

## 2024-02-02 MED ADMIN — famotidine (PEPCID) tablet 20 mg: 20 mg | ORAL | @ 02:00:00

## 2024-02-02 MED ADMIN — HYDROmorphone (PF) (DILAUDID) injection 1 mg: 1 mg | INTRAVENOUS | @ 10:00:00 | Stop: 2024-02-05

## 2024-02-02 MED ADMIN — HYDROmorphone (PF) (DILAUDID) injection 1 mg: 1 mg | INTRAVENOUS | @ 15:00:00 | Stop: 2024-02-05

## 2024-02-02 MED ADMIN — HYDROmorphone (PF) (DILAUDID) injection 1 mg: 1 mg | INTRAVENOUS | @ 06:00:00 | Stop: 2024-02-05

## 2024-02-02 MED ADMIN — HYDROmorphone (PF) (DILAUDID) injection 1 mg: 1 mg | INTRAVENOUS | @ 02:00:00 | Stop: 2024-02-05

## 2024-02-02 MED ADMIN — HYDROmorphone (PF) (DILAUDID) injection 1 mg: 1 mg | INTRAVENOUS | @ 21:00:00 | Stop: 2024-02-05

## 2024-02-02 MED ADMIN — baclofen (LIORESAL) tablet 5 mg: 5 mg | ORAL | @ 19:00:00

## 2024-02-02 MED ADMIN — escitalopram oxalate (LEXAPRO) tablet 5 mg: 5 mg | ORAL | @ 13:00:00

## 2024-02-02 MED ADMIN — lactated Ringers infusion: 75 mL/h | INTRAVENOUS | @ 19:00:00 | Stop: 2024-02-03

## 2024-02-02 MED ADMIN — oxyCODONE (ROXICODONE) 5 mg/5 mL solution 20 mg: 20 mg | ORAL | @ 17:00:00 | Stop: 2024-02-02

## 2024-02-02 MED ADMIN — oxyCODONE (ROXICODONE) 5 mg/5 mL solution 20 mg: 20 mg | ORAL | @ 07:00:00 | Stop: 2024-02-02

## 2024-02-02 MED ADMIN — oxyCODONE (ROXICODONE) 5 mg/5 mL solution 20 mg: 20 mg | ORAL | @ 03:00:00 | Stop: 2024-02-02

## 2024-02-02 MED ADMIN — oxyCODONE (ROXICODONE) 5 mg/5 mL solution 20 mg: 20 mg | ORAL | @ 22:00:00 | Stop: 2024-02-02

## 2024-02-02 MED ADMIN — oxyCODONE (ROXICODONE) 5 mg/5 mL solution 20 mg: 20 mg | ORAL | @ 13:00:00 | Stop: 2024-02-02

## 2024-02-02 MED ADMIN — HYDROmorphone (PF) (DILAUDID) injection 1 mg: 1 mg | INTRAVENOUS | @ 19:00:00 | Stop: 2024-02-02

## 2024-02-02 NOTE — Plan of Care (Signed)
 PT admitted for GI bleed. Pt is alert and oriented x4, room air, vss. Pt rcvd prn pain meds and a 1x order of dilaudid  for breakthrough pain. Pt port has good blood return, flushes fine, labs drawn. Pt worked with PT today, on tele, no calls. Pt had multiple bloody bm, provider notified. Pt has fmaily at bedside, bed in lowest position, call light within reach, no alarm. Pt has continuous LR running at 75 ML/hr.   Shift Summary  Pain remained severe and persistent throughout the shift, with multiple administrations of HYDROmorphone  and oxyCODONE  providing limited relief, and patient occasionally declined interventions.  Baclofen  was initially refused but later administered, and lactated Ringers  were given in the afternoon.  Hemoglobin and hematocrit levels remained low on labs drawn during the shift, consistent with ongoing GI bleed.  Skin integrity was maintained with regular positioning and adequate nutrition, and no allergy  symptoms were noted.  Overall, pain control was challenging and GI bleed continued to impact lab values, but skin health was preserved.    Absence of Allergy  Symptoms: No allergy  symptoms documented or reported during the shift.    Optimal Pain Control and Function: Abdominal pain remained constant and severe throughout the shift, with pain scores fluctuating between 7 and 9; multiple pain medications were administered, but pain relief  was limited, and patient declined some interventions.    Skin Health and Integrity: Skin remained within defined limits and Braden score was stable at 20, with adequate nutrition and regular supine positioning documented.

## 2024-02-02 NOTE — Plan of Care (Signed)
 Shift Summary  PRN HYDROmorphone  and oxyCODONE  were administered during the shift, resulting in temporary decreases in pain scores.   Patient with ongoing rectal bleeding overnight. Provider notified  Ondansetron  was given for nausea.   Baclofen  was refused by the patient.   Oral care was performed at 11:00 PM.   Pain remained chronic and fluctuated and overall status was stable with intermittent relief from medications.     Optimal Comfort and Wellbeing: Pain remained chronic and right-sided throughout the shift, with frequent reports of aching discomfort and constant pain; pain scores fluctuated but decreased after PRN HYDROmorphone  and oxyCODONE  were administered, though the patient declined some interventions and oral care was performed at 11:00 PM.     Optimal Pain Control and Function: Pain scores alternated between 9 and 4, with temporary relief following PRN HYDROmorphone  and oxyCODONE  administration, but pain returned to higher levels later in the shift; the patient consistently described pain as chronic, aching, and constant, and declined some interventions.     Nausea and Vomiting Relief: Ondansetron  was given PRN for nausea at 9:28 PM.

## 2024-02-02 NOTE — Progress Notes (Signed)
 Daily Progress Note    Assessment/Plan:    [name] [name] is a 23 y.o. female that presented to [facility] with GI bleed.    Acute on chronic LGI bleeding - Iron  Deficiency: Increased bleeding and pain for the past two weeks in the setting of prior colectomy and ileoanal anastamosis as below. Has known ulcers affecting her J pouch and history of prior GIBs.EGD and pouchoscopy 11/21 noted multiple (30-50) gastric polyps, no specimens collected and no evidence of pouchitis.  Biopsy did reveal adenoma (not high grade) that needs resection. She has had worsening bloody bowel movements over the past 24 hours  -s/p IV iron  infusion on 11/22 with signs of infusion reaction with hyperemia and swelling in multiple areas . Now improved after receiving steroids on 11/23.   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 Loris.Most recently trialed on nirogacestat  though recently has been held due to diarrhea, bleeding   -Oncology aware of admission  -IBreast u/s obtained on 11/22 and did not show evidence of fluid collections or infection, but a 'full diagnostic evaluation' would need to be considered via Breast Imaging Department as an outpatient - will discuss with oncology as this will need to be arranged in the outpatient setting     Acute on Chronic Pain: Follows with [facility] Pain management clinic. Per pain plan:  -Continue home butrans  patch  -Oxycodone  20mg  Q4hr PRN, IV dilaudid  1 mg Q4hr PRN  -Baclofen  5mg  TID     Mild adrenal insufficiency: Tentative diagnosis during last admission  -Continue Hydrocortisone  10mg  daily     GERD: continue home famotidine  20 mg nightly     Depression/Anxiety:  -Continue home lexapro  5 mg daily   -Continue home ativan  0.5 mg PRN    Disposition: Home once bleeding improved vs [facility] transfer  Medically Clear: No    DVT Prophylaxis  Med Administrations and Associated Flowsheet Values (last 24 hours)       None          Peripheral Vascular Interventions (last 24 hours):  Anti-Embolism Device Status: Other (Comment) (low risk)       I personally spent greater than 55 minutes face-to-face and non-face-to-face in the care of this patient, which includes all pre, intra, and post visit time on the date of service.  All documented time was specific to the E/M visit and does not include any procedures that may have been performed.  __________________________________________________________________    Subjective:    Overnight reported >5 bloody bowel movements that appear bright red per pictures. Worsening abdominal pain as well. Discouraged by worsening symptoms and reports having similar frequency of BRBPR prior to arrival at hospital. Decreased oral intake the past 24 hour.     Objective    Temp:  [36.4 ??C (97.6 ??F)-36.9 ??C (98.4 ??F)] 36.8 ??C (98.2 ??F)  Pulse:  [71-91] 85  Resp:  [16-20] 16  BP: (96-116)/(51-74) 96/61  SpO2:  [98 %-100 %] 99 %    Chronically ill appearing female sitting upright in NAD, appears uncomfortable  MMM  RRR without murmurs . Small area or erythema over left chest imprvoing  Lugns CTA bilaterally   Abdomen soft, mild periumbilical tenderness   No LE edema     Recent Results (from the past 24 hours)   Basic Metabolic Panel    Collection Time: 02/02/24  8:18 AM   Result Value Ref Range    Sodium 144 135 - 145 mmol/L    Potassium 3.6 3.4 - 4.8 mmol/L    Chloride 104 98 - 107 mmol/L    CO2 31.0 20.0 - 31.0 mmol/L    Anion Gap 9 5 - 14 mmol/L    BUN 13 9 - 23 mg/dL    Creatinine 9.41 9.44 - 1.02 mg/dL    BUN/Creatinine Ratio 22     eGFR CKD-EPI (2021) Female >90 >=60 mL/min/1.27m2    Glucose 92 70 - 179 mg/dL    Calcium 8.5 (L) 8.7 - 10.4 mg/dL   CBC    Collection Time: 02/02/24  8:18 AM   Result Value Ref Range    WBC 7.8 3.6 - 11.2 10*9/L    RBC 3.48 (L) 3.95 - 5.13 10*12/L    HGB 9.3 (L) 11.3 - 14.9 g/dL    HCT 71.7 (L) 65.9 - 44.0 %    MCV 80.9 77.6 - 95.7 fL    MCH 26.7 25.9 - 32.4 pg    MCHC 32.9 32.0 - 36.0 g/dL    RDW 84.6 (H) 87.7 - 15.2 %    MPV 9.7 6.8 - 10.7 fL    Platelet 243 150 - 450 10*9/L

## 2024-02-03 LAB — BASIC METABOLIC PANEL
ANION GAP: 10 mmol/L (ref 5–14)
BLOOD UREA NITROGEN: 7 mg/dL — ABNORMAL LOW (ref 9–23)
BUN / CREAT RATIO: 13
CALCIUM: 8.7 mg/dL (ref 8.7–10.4)
CHLORIDE: 103 mmol/L (ref 98–107)
CO2: 31 mmol/L (ref 20.0–31.0)
CREATININE: 0.54 mg/dL — ABNORMAL LOW (ref 0.55–1.02)
EGFR CKD-EPI (2021) FEMALE: >90 mL/min/1.73m2 (ref >=60)
GLUCOSE RANDOM: 97 mg/dL (ref 70–179)
POTASSIUM: 3.8 mmol/L (ref 3.4–4.8)
SODIUM: 144 mmol/L (ref 135–145)

## 2024-02-03 LAB — CBC
HEMATOCRIT: 30.6 % — ABNORMAL LOW (ref 34.0–44.0)
HEMOGLOBIN: 9.9 g/dL — ABNORMAL LOW (ref 11.3–14.9)
MEAN CORPUSCULAR HEMOGLOBIN CONC: 32.2 g/dL (ref 32.0–36.0)
MEAN CORPUSCULAR HEMOGLOBIN: 26.4 pg (ref 25.9–32.4)
MEAN CORPUSCULAR VOLUME: 81.9 fL (ref 77.6–95.7)
MEAN PLATELET VOLUME: 9.5 fL (ref 6.8–10.7)
PLATELET COUNT: 270 10*9/L (ref 150–450)
RED BLOOD CELL COUNT: 3.74 10*12/L — ABNORMAL LOW (ref 3.95–5.13)
RED CELL DISTRIBUTION WIDTH: 15.4 % — ABNORMAL HIGH (ref 12.2–15.2)
WBC ADJUSTED: 6.4 10*9/L (ref 3.6–11.2)

## 2024-02-03 MED ADMIN — acetaminophen (TYLENOL) tablet 1,000 mg: 1000 mg | ORAL | @ 16:00:00

## 2024-02-03 MED ADMIN — acetaminophen (TYLENOL) tablet 1,000 mg: 1000 mg | ORAL | @ 06:00:00

## 2024-02-03 MED ADMIN — acetaminophen (TYLENOL) tablet 1,000 mg: 1000 mg | ORAL | @ 23:00:00

## 2024-02-03 MED ADMIN — pantoprazole (Protonix) EC tablet 40 mg: 40 mg | ORAL | @ 23:00:00

## 2024-02-03 MED ADMIN — pantoprazole (Protonix) EC tablet 40 mg: 40 mg | ORAL | @ 14:00:00

## 2024-02-03 MED ADMIN — cetirizine (ZYRTEC) tablet 20 mg: 20 mg | ORAL | @ 14:00:00

## 2024-02-03 MED ADMIN — cetirizine (ZYRTEC) tablet 20 mg: 20 mg | ORAL | @ 01:00:00

## 2024-02-03 MED ADMIN — ondansetron (ZOFRAN) injection 4 mg: 4 mg | INTRAVENOUS | @ 04:00:00

## 2024-02-03 MED ADMIN — ondansetron (ZOFRAN) injection 4 mg: 4 mg | INTRAVENOUS | @ 20:00:00

## 2024-02-03 MED ADMIN — famotidine (PEPCID) tablet 20 mg: 20 mg | ORAL | @ 01:00:00

## 2024-02-03 MED ADMIN — oxyCODONE (ROXICODONE) 5 mg/5 mL solution 20 mg: 20 mg | ORAL | @ 23:00:00 | Stop: 2024-02-08

## 2024-02-03 MED ADMIN — diphenoxylate-atropine (LOMOTIL) 2.5-0.025 mg per tablet 1 tablet: 1 | ORAL | @ 23:00:00

## 2024-02-03 MED ADMIN — diphenoxylate-atropine (LOMOTIL) 2.5-0.025 mg per tablet 1 tablet: 1 | ORAL | @ 18:00:00

## 2024-02-03 MED ADMIN — HYDROmorphone (PF) (DILAUDID) injection 1 mg: 1 mg | INTRAVENOUS | @ 16:00:00 | Stop: 2024-02-05

## 2024-02-03 MED ADMIN — HYDROmorphone (PF) (DILAUDID) injection 1 mg: 1 mg | INTRAVENOUS | @ 02:00:00 | Stop: 2024-02-05

## 2024-02-03 MED ADMIN — HYDROmorphone (PF) (DILAUDID) injection 1 mg: 1 mg | INTRAVENOUS | @ 11:00:00 | Stop: 2024-02-05

## 2024-02-03 MED ADMIN — HYDROmorphone (PF) (DILAUDID) injection 1 mg: 1 mg | INTRAVENOUS | @ 05:00:00 | Stop: 2024-02-05

## 2024-02-03 MED ADMIN — HYDROmorphone (PF) (DILAUDID) injection 1 mg: 1 mg | INTRAVENOUS | @ 20:00:00 | Stop: 2024-02-05

## 2024-02-03 MED ADMIN — hydrocortisone (CORTEF) tablet 10 mg: 10 mg | ORAL | @ 14:00:00

## 2024-02-03 MED ADMIN — baclofen (LIORESAL) tablet 5 mg: 5 mg | ORAL | @ 18:00:00

## 2024-02-03 MED ADMIN — baclofen (LIORESAL) tablet 5 mg: 5 mg | ORAL | @ 01:00:00

## 2024-02-03 MED ADMIN — baclofen (LIORESAL) tablet 5 mg: 5 mg | ORAL | @ 14:00:00

## 2024-02-03 MED ADMIN — famotidine (PF) (PEPCID) injection 20 mg: 20 mg | INTRAVENOUS | @ 08:00:00 | Stop: 2024-02-03

## 2024-02-03 MED ADMIN — escitalopram oxalate (LEXAPRO) tablet 5 mg: 5 mg | ORAL | @ 14:00:00

## 2024-02-03 MED ADMIN — lactated Ringers infusion: 75 mL/h | INTRAVENOUS | @ 06:00:00 | Stop: 2024-02-03

## 2024-02-03 MED ADMIN — scopolamine (TRANSDERM-SCOP) 1 mg over 3 days topical patch 1 mg: 1 | TOPICAL | @ 06:00:00

## 2024-02-03 MED ADMIN — oxyCODONE (ROXICODONE) 5 mg/5 mL solution 20 mg: 20 mg | ORAL | @ 04:00:00 | Stop: 2024-02-02

## 2024-02-03 MED ADMIN — diphenhydrAMINE (BENADRYL) injection 25 mg: 25 mg | INTRAVENOUS | @ 07:00:00 | Stop: 2024-02-03

## 2024-02-03 MED ADMIN — oxyCODONE (ROXICODONE) 5 mg/5 mL solution 20 mg: 20 mg | ORAL | @ 18:00:00 | Stop: 2024-02-03

## 2024-02-03 MED ADMIN — oxyCODONE (ROXICODONE) 5 mg/5 mL solution 20 mg: 20 mg | ORAL | @ 14:00:00 | Stop: 2024-02-03

## 2024-02-03 NOTE — Progress Notes (Signed)
 Daily Progress Note    Assessment/Plan:    [name] [name] is a 23 y.o. female that presented to [facility] with GI bleed.    Acute on chronic LGI bleeding - Iron  Deficiency: Increased bleeding and pain for the past two weeks in the setting of prior colectomy and ileoanal anastamosis as below. Has known ulcers affecting her J pouch and history of prior GIBs.EGD and pouchoscopy 11/21 noted multiple (30-50) gastric polyps, no specimens collected and no evidence of pouchitis.  Biopsy did reveal adenoma (not high grade) that needs resection. She has had frequent bloody bowel movements the past 48 hours although seem to be slowing some   -s/p IV iron  infusion on 11/22 with signs of infusion reaction with hyperemia and swelling in multiple areas . Now improved after receiving steroids on 11/23.   -With worsening bleeding discussed transfer to [facility] for double balloon endoscopy, they are unable to perform prior to Monday given Holiday   -Continue to monitor for bleeding in house, per out GI team no further endoscopy warranted  -Transfuse >7   -IV zofran  4 mg Q6hr PRN  -Trend CBC  -Trial Lomitil to help with bowel frequency     Desmoid Fibromatosis s/p Proctocolectomy with Ileoanal anastomosis  - Familial Adenomatous Polyposis:   Follows with Dr. Loris.Most recently trialed on nirogacestat  though recently has been held due to diarrhea, bleeding   -Oncology aware of admission  -IBreast u/s obtained on 11/22 and did not show evidence of fluid collections or infection, but a 'full diagnostic evaluation' would need to be considered via Breast Imaging Department as an outpatient     Acute on Chronic Pain: Follows with [facility] Pain management clinic. Per pain plan:  -Continue home butrans  patch  -Oxycodone  20mg  Q4hr PRN, IV dilaudid  1 mg Q4hr PRN  -Baclofen  5mg  TID     Mild adrenal insufficiency: Tentative diagnosis during last admission  -Continue Hydrocortisone  10mg  daily     GERD: continue home famotidine  20 mg nightly    Malnutrition   -RD consult     Depression/Anxiety:  -Continue home lexapro  5 mg daily   -Continue home ativan  0.5 mg PRN    Disposition: Home once bleeding improved vs [facility] transfer  Medically Clear: No    DVT Prophylaxis  Med Administrations and Associated Flowsheet Values (last 24 hours)       None          Peripheral Vascular Interventions (last 24 hours):  Anti-Embolism Device Status: Other (Comment) (low risk)       I personally spent greater than 55 minutes face-to-face and non-face-to-face in the care of this patient, which includes all pre, intra, and post visit time on the date of service.  All documented time was specific to the E/M visit and does not include any procedures that may have been performed.  __________________________________________________________________    Subjective:    Overnight reported lightheadedness and tachycardia when going to restroom. Continues to have increased bowel frequency although slightly improved this am and less dark . Has not been able to to take great PO. Also concerned of swelling on left side of face      Objective    Temp:  [36.7 ??C (98.1 ??F)-36.8 ??C (98.2 ??F)] 36.8 ??C (98.2 ??F)  Pulse:  [67-89] 86  SpO2 Pulse:  [68-88] 88  Resp:  [16-18] 18  BP: (101-114)/(50-70) 114/70  SpO2:  [98 %-99 %] 98 %    Chronically ill appearing female sitting upright in NAD,  MMM,  mild facial swelling on left side   RRR without murmurs   Lugns CTA bilaterally   Abdomen soft, mild periumbilical tenderness   No LE edema     Recent Results (from the past 24 hours)   Hemoglobin and Hematocrit    Collection Time: 02/02/24  1:41 PM   Result Value Ref Range    HGB 9.5 (L) 11.3 - 14.9 g/dL    HCT 70.4 (L) 65.9 - 44.0 %   CBC    Collection Time: 02/03/24  5:38 AM   Result Value Ref Range    WBC 6.4 3.6 - 11.2 10*9/L    RBC 3.74 (L) 3.95 - 5.13 10*12/L    HGB 9.9 (L) 11.3 - 14.9 g/dL    HCT 69.3 (L) 65.9 - 44.0 %    MCV 81.9 77.6 - 95.7 fL    MCH 26.4 25.9 - 32.4 pg    MCHC 32.2 32.0 - 36.0 g/dL    RDW 84.5 (H) 87.7 - 15.2 %    MPV 9.5 6.8 - 10.7 fL    Platelet 270 150 - 450 10*9/L   Basic Metabolic Panel    Collection Time: 02/03/24  5:38 AM   Result Value Ref Range    Sodium 144 135 - 145 mmol/L    Potassium 3.8 3.4 - 4.8 mmol/L    Chloride 103 98 - 107 mmol/L    CO2 31.0 20.0 - 31.0 mmol/L    Anion Gap 10 5 - 14 mmol/L    BUN 7 (L) 9 - 23 mg/dL    Creatinine 9.45 (L) 0.55 - 1.02 mg/dL    BUN/Creatinine Ratio 13     eGFR CKD-EPI (2021) Female >90 >=60 mL/min/1.61m2    Glucose 97 70 - 179 mg/dL    Calcium 8.7 8.7 - 89.5 mg/dL

## 2024-02-03 NOTE — Plan of Care (Signed)
 Patient alert and oriented x 4 today.  Vitals have been stable.  Patient endorses abdominal pain, worse after eating.  Oxycodone  and Dilauded administered for pain.  Patient endorses some left side facial swelling/tightness, MD aware.  Will monitor and assess for any changes or worsening.  Patient mom at bedside.    Shift Summary  Pain was managed with multiple administrations of PRN analgesics and non-pharmacologic interventions, with variable relief.   The patient declined pain interventions at several points during the shift.   Fall reduction and skin protection strategies were maintained, and infection prevention protocols were followed.   The environment remained safe with side rails up and no alarms present.   Overall, the patient experienced persistent abdominal pain but no hospital-acquired injuries or illnesses were documented.     Absence of Hospital-Acquired Illness or Injury: No hospital-acquired injuries or illnesses were documented during the shift; fall reduction and skin protection interventions were maintained, and infection prevention measures were consistently performed. The environment remained safe throughout the shift with side rails up and no alarms present.     Optimal Pain Control and Function: Abdominal pain remained constant and acute, fluctuating between 4 and 8 on the pain scale; pain was managed with PRN HYDROmorphone , oxyCODONE , repositioning, and non-pharmacologic interventions, though pain relief  was variable and the patient declined some interventions.

## 2024-02-03 NOTE — Progress Notes (Signed)
 [facility]   Progress Note         Assessment and Recommendations:   Analyssa Zurri [name] is a 23 y.o. 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 The patient is seen in consultation at the request of Dan Rocher, MD ([facility])) for gastrointestinal bleeding and hematochezia.     Acute on chronic rectal bleeding, improving  Hematochezia  Chronic IDA  H/o [name] s/p TAC  Desmoid tumors  Reported syncope  Discussed pathology findings of rectal cuff adenoma. Will refer patient for evaluation/resection. Patient amenable with plan. Still endorses band-like abdominal pain that she says is atypical. Reassured by normal appearing EGD, recent stable CTAP, and normal LFTs.    - Referral to [facility] GI advanced resection team  - Pain regimen per primary team  - Recommend pursuing DBE at [facility] as previously discussed    Issues Impacting Complexity of Management:  -None    Recommendations discussed with the patient's primary team. We will sign-off at this time, please re-contact if additional questions or a new need for consultation arises.    Subjective:     This afternoon patient sitting in bed with service animal and parents in the room. She continues to have mild abdominal pain and chronic rectal bleeding. Patient amenable with plan to be referred for outpatient evaluation for resection of rectal cuff adenoma.    Objective:   Temp:  [36.7 ??C (98.1 ??F)-36.8 ??C (98.2 ??F)] 36.7 ??C (98.1 ??F)  Pulse:  [67-99] 99  SpO2 Pulse:  [68-88] 88  Resp:  [16-18] 18  BP: (97-114)/(50-70) 98/56  SpO2:  [98 %-99 %] 98 %    Gen: WDWN female in NAD, answers questions appropriately    Pertinent Labs & Studies:  -I have reviewed the patient's labs from 02/03/24 which show stable Hgb and stable renal function (SCr, electrolytes)

## 2024-02-04 LAB — BASIC METABOLIC PANEL
ANION GAP: 9 mmol/L (ref 5–14)
BLOOD UREA NITROGEN: 10 mg/dL (ref 9–23)
BUN / CREAT RATIO: 16
CALCIUM: 9 mg/dL (ref 8.7–10.4)
CHLORIDE: 101 mmol/L (ref 98–107)
CO2: 32 mmol/L — ABNORMAL HIGH (ref 20.0–31.0)
CREATININE: 0.61 mg/dL (ref 0.55–1.02)
EGFR CKD-EPI (2021) FEMALE: >90 mL/min/1.73m2 (ref >=60)
GLUCOSE RANDOM: 96 mg/dL (ref 70–179)
POTASSIUM: 4.1 mmol/L (ref 3.4–4.8)
SODIUM: 142 mmol/L (ref 135–145)

## 2024-02-04 MED ADMIN — acetaminophen (TYLENOL) tablet 1,000 mg: 1000 mg | ORAL | @ 14:00:00

## 2024-02-04 MED ADMIN — acetaminophen (TYLENOL) tablet 1,000 mg: 1000 mg | ORAL | @ 22:00:00

## 2024-02-04 MED ADMIN — acetaminophen (TYLENOL) tablet 1,000 mg: 1000 mg | ORAL | @ 06:00:00

## 2024-02-04 MED ADMIN — pantoprazole (Protonix) EC tablet 40 mg: 40 mg | ORAL | @ 22:00:00

## 2024-02-04 MED ADMIN — pantoprazole (Protonix) EC tablet 40 mg: 40 mg | ORAL | @ 12:00:00

## 2024-02-04 MED ADMIN — diphenhydrAMINE (BENADRYL) injection: 25 mg | INTRAVENOUS | @ 09:00:00

## 2024-02-04 MED ADMIN — cetirizine (ZYRTEC) tablet 20 mg: 20 mg | ORAL | @ 14:00:00

## 2024-02-04 MED ADMIN — cetirizine (ZYRTEC) tablet 20 mg: 20 mg | ORAL | @ 01:00:00

## 2024-02-04 MED ADMIN — ondansetron (ZOFRAN) injection 4 mg: 4 mg | INTRAVENOUS | @ 06:00:00

## 2024-02-04 MED ADMIN — ondansetron (ZOFRAN) injection 4 mg: 4 mg | INTRAVENOUS

## 2024-02-04 MED ADMIN — famotidine (PEPCID) tablet 20 mg: 20 mg | ORAL | @ 01:00:00

## 2024-02-04 MED ADMIN — oxyCODONE (ROXICODONE) 5 mg/5 mL solution 20 mg: 20 mg | ORAL | @ 16:00:00 | Stop: 2024-02-08

## 2024-02-04 MED ADMIN — oxyCODONE (ROXICODONE) 5 mg/5 mL solution 20 mg: 20 mg | ORAL | @ 08:00:00 | Stop: 2024-02-08

## 2024-02-04 MED ADMIN — oxyCODONE (ROXICODONE) 5 mg/5 mL solution 20 mg: 20 mg | ORAL | @ 12:00:00 | Stop: 2024-02-08

## 2024-02-04 MED ADMIN — oxyCODONE (ROXICODONE) 5 mg/5 mL solution 20 mg: 20 mg | ORAL | @ 21:00:00 | Stop: 2024-02-08

## 2024-02-04 MED ADMIN — oxyCODONE (ROXICODONE) 5 mg/5 mL solution 20 mg: 20 mg | ORAL | @ 04:00:00 | Stop: 2024-02-08

## 2024-02-04 MED ADMIN — diphenoxylate-atropine (LOMOTIL) 2.5-0.025 mg per tablet 1 tablet: 1 | ORAL | @ 01:00:00

## 2024-02-04 MED ADMIN — diphenoxylate-atropine (LOMOTIL) 2.5-0.025 mg per tablet 1 tablet: 1 | ORAL | @ 16:00:00

## 2024-02-04 MED ADMIN — diphenoxylate-atropine (LOMOTIL) 2.5-0.025 mg per tablet 1 tablet: 1 | ORAL | @ 22:00:00

## 2024-02-04 MED ADMIN — diphenoxylate-atropine (LOMOTIL) 2.5-0.025 mg per tablet 1 tablet: 1 | ORAL | @ 12:00:00

## 2024-02-04 MED ADMIN — HYDROmorphone (PF) (DILAUDID) injection 1 mg: 1 mg | INTRAVENOUS | Stop: 2024-02-05

## 2024-02-04 MED ADMIN — HYDROmorphone (PF) (DILAUDID) injection 1 mg: 1 mg | INTRAVENOUS | @ 19:00:00 | Stop: 2024-02-05

## 2024-02-04 MED ADMIN — HYDROmorphone (PF) (DILAUDID) injection 1 mg: 1 mg | INTRAVENOUS | @ 14:00:00 | Stop: 2024-02-05

## 2024-02-04 MED ADMIN — HYDROmorphone (PF) (DILAUDID) injection 1 mg: 1 mg | INTRAVENOUS | @ 01:00:00 | Stop: 2024-02-05

## 2024-02-04 MED ADMIN — HYDROmorphone (PF) (DILAUDID) injection 1 mg: 1 mg | INTRAVENOUS | @ 06:00:00 | Stop: 2024-02-05

## 2024-02-04 MED ADMIN — hydrocortisone (CORTEF) tablet 10 mg: 10 mg | ORAL | @ 14:00:00

## 2024-02-04 MED ADMIN — baclofen (LIORESAL) tablet 5 mg: 5 mg | ORAL | @ 01:00:00

## 2024-02-04 MED ADMIN — baclofen (LIORESAL) tablet 5 mg: 5 mg | ORAL | @ 19:00:00

## 2024-02-04 MED ADMIN — escitalopram oxalate (LEXAPRO) tablet 5 mg: 5 mg | ORAL | @ 14:00:00

## 2024-02-04 MED ADMIN — buprenorphine 20 mcg/hour transdermal patch 1 patch: 1 | TRANSDERMAL | @ 06:00:00

## 2024-02-04 MED ADMIN — fluticasone propionate (FLONASE) 50 mcg/actuation nasal spray 2 spray: 2 | TOPICAL | @ 06:00:00

## 2024-02-04 MED ADMIN — diphenhydrAMINE (BENADRYL) injection: 25 mg | INTRAVENOUS | @ 04:00:00 | Stop: 2024-02-03

## 2024-02-04 NOTE — Plan of Care (Signed)
 Patient alert and oriented. Medicated for pain per MD order. Family at bedside.  Shift Summary  Pain management was a focus, with HYDROmorphone  administered twice and baclofen  accepted in the afternoon, resulting in improved comfort by the end of the shift.   Fall prevention strategies were maintained throughout, including environmental modifications, hourly checks, and family support, with no falls or injuries documented.   Family remained present and updated, supporting readiness for transition of care, while LTACH Candidacy Score stayed stable at 75.   No allergy  symptoms or respiratory pathogens were documented during the shift.   Overall, patient remained comfortable and safe with effective pain control and consistent fall prevention interventions.    Readiness for Transition of Care: LTACH Candidacy Score remained stable at 75 throughout the shift, and family was present for updates and support during the day.    Absence of Allergy  Symptoms: No symptoms or documentation related to allergy  were noted during the shift.    Optimal Pain Control and Function: Abdominal pain fluctuated, peaking at 8 before dropping to 0 after HYDROmorphone  administration; baclofen  was refused in the morning but accepted in the afternoon, and patient was sleeping comfortably later in the shift.    Absence of Fall and Fall-Related Injury: Fall reduction interventions were consistently maintained, including environmental modifications, low bed, family presence, and hourly visual checks, with no falls or injuries documented.

## 2024-02-04 NOTE — Consults (Signed)
 Adult Nutrition Assessment Note    Visit Type: MD Consult  Reason for Visit: Assessment (Nutrition)    NUTRITION INTERVENTIONS and RECOMMENDATION     Continue regular/house diet  Oral nutrition supplements: Pediasure TID, between meals   Continue zofran  and phenergan  prn for nausea  Weekly weights    NUTRITION ASSESSMENT     Current  nutrition therapy is appropriate although not meeting nutritional  needs at this time due to nausea & decreased appetite  Patient would benefit from start of oral supplement to better meet nutritional needs.    NUTRITIONALLY RELEVANT DATA     HPI & PMH:   Per provider note: [name] [name] is a 23 y.o. female that presented to [facility] with GI bleed.      Nutrition History:   Patient reports decreased appetite/intake over the past 2 weeks. She reports appetite has declined even more since being in the hospital with GI bleed. She reports she's never been a big eater. An example of a meal at home would consist of 2 chicken breasts topped with spinach and cheese with a side of mixed vegetables, or a chicken sandwich from [employer] and she would typically finish 50-75% of the sandwich.  She reports having nausea over the past few weeks which has been fairly well controlled with zofran  in the hospital, but she sometimes has to take phenergan  as well if the zofran  is not effective. Denies vomiting. Patient states since her colon was removed she has experienced more frequent BM's. She has been taking Imodium  4x/day and frequency of stools has been reduced but still more frequent than usual. Pt  had a BM this morning. She denies any significant weight changes recently. Patient reports she doesn't like Ensure as it upsets her stomach, but she likes Pediasure and can tolerate it.     Medications:  Nutritionally pertinent medications reviewed and evaluated for potential food and/or medication interactions and include lomotil , Pepcid , oxycodone , protonix , scopolamine  patch. Prn medications include TUMS, Pepcid , Imodium , zofran , phenergan , simethicone .  Infusions: lactated ringers  at 10 mL/hr    Labs:   Nutritionally pertinent labs reviewed.     Nutritional Needs:   Healthy balance of carbohydrate, protein, and fat.     Anthropometric Data:  Height: 165.1 cm (5' 5)   Admission weight: 57.5 kg (126 lb 11.2 oz)  Last recorded weight: 57.1 kg (125 lb 12.8 oz) (01/28/24)  IBW: 56.75 kg  BMI: Body mass index is 20.93 kg/m??.   Usual Body Weight: 120-125 lbs  Weight Assessment: No significant weight changes recently           01/28/24 57.1 kg (125 lb 12.8 oz)   12/29/23 56.3 kg (124 lb 1.6 oz)   12/04/23 57 kg (125 lb 11.2 oz)   11/03/23 57.1 kg (125 lb 12.8 oz)   10/15/23 54.7 kg (120 lb 9.6 oz)   10/06/23 55 kg (121 lb 4.1 oz)   09/29/23 54.9 kg (121 lb)   09/23/23 53.5 kg (118 lb)         Malnutrition Assessment:  Malnutrition assessment not yet completed at this time due to inability to complete nutrition focused physical exam (NFPE).    Nutrition Focused Physical Exam:  Unable to complete at this time due to patient availability  - provider entered room. RD assessment cut short    Care plan:  Ongoing, unable to diagnose malnutrition at this time    Current Nutrition:  Oral intake   Nutrition Orders  Nutrition Therapy Regular/House starting at 11/21 1359          Nutritionally Pertinent Allergies, Intolerances, Sensitivities, and/or Cultural/Religious Restrictions:  Allergy  to Papaya. Also noted allergies to Reglan , Iron  analogues, iron  dextran.     GOALS and EVALUATION     Patient to meet 75% or greater of nutritional needs via combination of meals, snacks, and/or oral supplements within hospital stay.  - New    Motivation, Barriers, and Compliance:  Evaluation of motivation, barriers, and compliance completed. No concerns identified at this time.     Discharge Planning:   Monitor for potential discharge needs with multi-disciplinary team.       Follow-Up Parameters:   1-2 times per week (and more frequent as indicated)    Evalene Paling, MS, RD, LDN, CNSC  Pager # [phone]

## 2024-02-04 NOTE — Plan of Care (Signed)
 Shift Summary  HYDROmorphone  was administered twice during the shift for pain, with only brief improvement in pain scores.  Ondansetron  was given for nausea, with no further reports of nausea or vomiting documented after administration.  Skin integrity was maintained with appropriate interventions, including frequent repositioning and use of pressure-redistributing devices.  Patient declined additional pain interventions despite ongoing discomfort.  Overall, pain and comfort remained challenging, but skin and nausea management were supported with appropriate interventions.    Optimal Comfort and Wellbeing: Pain remained constant and moderate to severe throughout the shift, with some temporary reduction after HYDROmorphone  administration, but overall discomfort persisted and patient declined further interventions.    Optimal Pain Control and Function: Pain scores decreased briefly after HYDROmorphone  at 8:05 PM but returned to previous levels later in the shift; pain described as sore and aching, constant, and acute, with patient declining additional interventions.    Skin Health and Integrity: Skin remained within defined limits, with frequent weight shifts, pressure-redistributing mattress, and incontinence pads used throughout the shift; Braden score was 22 and nutrition was adequate.    Nausea and Vomiting Relief: Ondansetron  was given for nausea at 1:04 AM; no further documentation of nausea or vomiting after administration.

## 2024-02-04 NOTE — Progress Notes (Signed)
 Daily Progress Note    Assessment/Plan:    [name] [name] is a 23 y.o. female that presented to [facility] with GI bleed.    Acute on chronic LGI bleeding - Iron  Deficiency: Increased bleeding and pain for the past two weeks in the setting of prior colectomy and ileoanal anastamosis as below. Has known ulcers affecting her J pouch and history of prior GIBs.EGD and pouchoscopy 11/21 noted multiple (30-50) gastric polyps, no specimens collected and no evidence of pouchitis.  Biopsy did reveal adenoma (not high grade) that needs resection. 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 Loris.Most recently trialed on nirogacestat  though recently has been held due to diarrhea, bleeding   -Oncology aware of admission  -Breast u/s obtained on 11/22 and did not show evidence of fluid collections or infection, but a 'full diagnostic evaluation' would need to be considered via Breast Imaging Department as an outpatient     Acute on Chronic Pain: Follows with [facility] Pain management clinic. Per pain plan:  -Continue home butrans  patch  -Oxycodone  20mg  Q4hr PRN, IV dilaudid  1 mg Q4hr PRN  -Baclofen  5mg  TID     Mild adrenal insufficiency: Tentative diagnosis during last admission  -Continue Hydrocortisone  10mg  daily, consider stress if hypotensive     GERD: continue home famotidine  20 mg nightly    Malnutrition   -RD consult     Depression/Anxiety:  -Continue home lexapro  5 mg daily -Continue home ativan  0.5 mg PRN    Disposition: Home once bleeding improved vs [facility] transfer  Medically Clear: No    DVT Prophylaxis  Med Administrations and Associated Flowsheet Values (last 24 hours)       None          Peripheral Vascular Interventions (last 24 hours):  Anti-Embolism Device Status: Other (Comment) (GI bleed)       I personally spent greater than 55 minutes face-to-face and non-face-to-face in the care of this patient, which includes all pre, intra, and post visit time on the date of service.  All documented time was specific to the E/M visit and does not include any procedures that may have been performed.  __________________________________________________________________    Subjective:    Reports bowel frequency is slightly improved since starting Imodium . Reports ongoing mild swelling of left face and hoarse throat/pain. BMs have had less bright red blood. Ongoing pain in periumbilical area . Poor oral intake     Objective    Temp:  [36.7 ??C (98.1 ??F)-36.8 ??C (98.2 ??F)] 36.8 ??C (98.2 ??F)  Pulse:  [79-99] 99  SpO2 Pulse:  [88] 88  Resp:  [16-18] 17  BP: (90-114)/(52-82) 100/82  SpO2:  [95 %-99 %] 95 %    Chronically ill appearing female sitting upright in NAD  MMM, mild facial swelling on left side , No lesions in oropharynx   RRR without murmurs   Lugns CTA bilaterally   Abdomen soft, mild  periumbilical tenderness   No LE edema     Recent Results (from the past 24 hours)   Basic Metabolic Panel    Collection Time: 02/04/24  9:53 AM   Result Value Ref Range    Sodium 142 135 - 145 mmol/L    Potassium 4.1 3.4 - 4.8 mmol/L    Chloride 101 98 - 107 mmol/L    CO2 32.0 (H) 20.0 - 31.0 mmol/L    Anion Gap 9 5 - 14 mmol/L    BUN 10 9 - 23 mg/dL    Creatinine 9.38 9.44 - 1.02 mg/dL    BUN/Creatinine Ratio 16     eGFR CKD-EPI (2021) Female >90 >=60 mL/min/1.72m2    Glucose 96 70 - 179 mg/dL    Calcium 9.0 8.7 - 89.5 mg/dL   Respiratory Pathogen Panel    Collection Time: 02/04/24 10:24 AM   Result Value Ref Range    Adenovirus Not Detected Not Detected    Coronavirus HKU1 Not Detected Not Detected    Coronavirus NL63 Not Detected Not Detected    Coronavirus 229E Not Detected Not Detected    Coronavirus OC43 PCR Not Detected Not Detected    Metapneumovirus Not Detected Not Detected    Rhinovirus/Enterovirus Not Detected Not Detected    Influenza A Not Detected Not Detected    Influenza B Not Detected Not Detected    Parainfluenza 1 Not Detected Not Detected    Parainfluenza 2 Not Detected Not Detected    Parainfluenza 3 Not Detected Not Detected    Parainfluenza 4 Not Detected Not Detected    RSV Not Detected Not Detected    Bordetella pertussis Not Detected Not Detected    Bordetella parapertussis Not Detected Not Detected    Chlamydophila (Chlamydia) pneumoniae Not Detected Not Detected    Mycoplasma pneumoniae Not Detected Not Detected    SARS-CoV-2 PCR Not Detected Not Detected

## 2024-02-05 LAB — CBC
HEMATOCRIT: 28.6 % — ABNORMAL LOW (ref 34.0–44.0)
HEMOGLOBIN: 9.6 g/dL — ABNORMAL LOW (ref 11.3–14.9)
MEAN CORPUSCULAR HEMOGLOBIN CONC: 33.5 g/dL (ref 32.0–36.0)
MEAN CORPUSCULAR HEMOGLOBIN: 27.5 pg (ref 25.9–32.4)
MEAN CORPUSCULAR VOLUME: 82 fL (ref 77.6–95.7)
MEAN PLATELET VOLUME: 9.4 fL (ref 6.8–10.7)
PLATELET COUNT: 239 10*9/L (ref 150–450)
RED BLOOD CELL COUNT: 3.49 10*12/L — ABNORMAL LOW (ref 3.95–5.13)
RED CELL DISTRIBUTION WIDTH: 15.8 % — ABNORMAL HIGH (ref 12.2–15.2)
WBC ADJUSTED: 6.3 10*9/L (ref 3.6–11.2)

## 2024-02-05 MED ADMIN — acetaminophen (TYLENOL) tablet 1,000 mg: 1000 mg | ORAL | @ 08:00:00

## 2024-02-05 MED ADMIN — acetaminophen (TYLENOL) tablet 1,000 mg: 1000 mg | ORAL | @ 23:00:00

## 2024-02-05 MED ADMIN — acetaminophen (TYLENOL) tablet 1,000 mg: 1000 mg | ORAL | @ 14:00:00

## 2024-02-05 MED ADMIN — pantoprazole (Protonix) EC tablet 40 mg: 40 mg | ORAL | @ 22:00:00

## 2024-02-05 MED ADMIN — pantoprazole (Protonix) EC tablet 40 mg: 40 mg | ORAL | @ 13:00:00

## 2024-02-05 MED ADMIN — diphenhydrAMINE (BENADRYL) injection: 25 mg | INTRAVENOUS | @ 03:00:00

## 2024-02-05 MED ADMIN — diphenhydrAMINE (BENADRYL) injection: 25 mg | INTRAVENOUS | @ 08:00:00

## 2024-02-05 MED ADMIN — cetirizine (ZYRTEC) tablet 20 mg: 20 mg | ORAL | @ 13:00:00

## 2024-02-05 MED ADMIN — cetirizine (ZYRTEC) tablet 20 mg: 20 mg | ORAL | @ 01:00:00

## 2024-02-05 MED ADMIN — ondansetron (ZOFRAN) injection 4 mg: 4 mg | INTRAVENOUS | @ 19:00:00

## 2024-02-05 MED ADMIN — famotidine (PEPCID) tablet 20 mg: 20 mg | ORAL | @ 01:00:00

## 2024-02-05 MED ADMIN — oxyCODONE (ROXICODONE) 5 mg/5 mL solution 20 mg: 20 mg | ORAL | @ 11:00:00 | Stop: 2024-02-08

## 2024-02-05 MED ADMIN — oxyCODONE (ROXICODONE) 5 mg/5 mL solution 20 mg: 20 mg | ORAL | @ 01:00:00 | Stop: 2024-02-08

## 2024-02-05 MED ADMIN — oxyCODONE (ROXICODONE) 5 mg/5 mL solution 20 mg: 20 mg | ORAL | @ 05:00:00 | Stop: 2024-02-08

## 2024-02-05 MED ADMIN — oxyCODONE (ROXICODONE) 5 mg/5 mL solution 20 mg: 20 mg | ORAL | @ 17:00:00 | Stop: 2024-02-08

## 2024-02-05 MED ADMIN — oxyCODONE (ROXICODONE) 5 mg/5 mL solution 20 mg: 20 mg | ORAL | @ 22:00:00 | Stop: 2024-02-08

## 2024-02-05 MED ADMIN — diphenoxylate-atropine (LOMOTIL) 2.5-0.025 mg per tablet 1 tablet: 1 | ORAL | @ 01:00:00

## 2024-02-05 MED ADMIN — diphenoxylate-atropine (LOMOTIL) 2.5-0.025 mg per tablet 1 tablet: 1 | ORAL | @ 11:00:00 | Stop: 2024-02-05

## 2024-02-05 MED ADMIN — HYDROmorphone (PF) (DILAUDID) injection 1 mg: 1 mg | INTRAVENOUS | @ 08:00:00 | Stop: 2024-02-05

## 2024-02-05 MED ADMIN — HYDROmorphone (PF) (DILAUDID) injection 1 mg: 1 mg | INTRAVENOUS | @ 23:00:00 | Stop: 2024-02-05

## 2024-02-05 MED ADMIN — HYDROmorphone (PF) (DILAUDID) injection 1 mg: 1 mg | INTRAVENOUS | @ 13:00:00 | Stop: 2024-02-05

## 2024-02-05 MED ADMIN — HYDROmorphone (PF) (DILAUDID) injection 1 mg: 1 mg | INTRAVENOUS | @ 04:00:00 | Stop: 2024-02-05

## 2024-02-05 MED ADMIN — HYDROmorphone (PF) (DILAUDID) injection 1 mg: 1 mg | INTRAVENOUS | @ 19:00:00 | Stop: 2024-02-05

## 2024-02-05 MED ADMIN — diphenoxylate-atropine (LOMOTIL) 2.5-0.025 mg per tablet 2 tablet: 2 | ORAL | @ 17:00:00

## 2024-02-05 MED ADMIN — diphenoxylate-atropine (LOMOTIL) 2.5-0.025 mg per tablet 2 tablet: 2 | ORAL | @ 22:00:00

## 2024-02-05 MED ADMIN — hydrocortisone (CORTEF) tablet 10 mg: 10 mg | ORAL | @ 13:00:00

## 2024-02-05 MED ADMIN — baclofen (LIORESAL) tablet 5 mg: 5 mg | ORAL | @ 01:00:00

## 2024-02-05 MED ADMIN — baclofen (LIORESAL) tablet 5 mg: 5 mg | ORAL | @ 19:00:00

## 2024-02-05 MED ADMIN — escitalopram oxalate (LEXAPRO) tablet 5 mg: 5 mg | ORAL | @ 14:00:00

## 2024-02-05 NOTE — Plan of Care (Addendum)
 A/Ox4, RA, VSS, afebrile, up ad lib, 3 BM. R subclavian chest power port-a-cath, single hub in place, clean, dry, intact, and flushed per protocol.  PRN IV dilaudid  given for pain. Pt complained of throat tightness, pain with swallowing, left sided swelling to cheeks and gums, provider notified since it was early to give PRN IV benadryl , provider stated can give it early, IV benadryl  given with some good effect. Continuous pulse ox monitoring in progress. Low bed, call bell within reach, fall precautions maintained. Family at bedside.     Shift Summary  Pain increased slightly and HYDROmorphone  was administered PRN, but further interventions were declined twice.   Diphenhydramine  was given for allergy  symptoms, with no further issues noted.   Comfort and hygiene were maintained with oral care and CHG wipes, and family support was present throughout the shift.   Patient remained alert, oriented, and safe in bed, with adequate nutrition and no bowel incontinence.   Overall, patient experienced persistent pain and required allergy  management, but comfort and safety were supported during the shift.    Optimal Comfort and Wellbeing: Comfort measures such as oral care and CHG wipes were provided, and family was present throughout the shift; patient remained alert and oriented, with adequate nutrition and skin within defined limits. No bowel incontinence was reported, and hourly visual checks confirmed rest and safety in bed.    Absence of Allergy  Symptoms: Diphenhydramine  was administered once for allergies, and no further allergy  symptoms were documented during the shift.    Optimal Pain Control and Function: Abdominal pain persisted and slightly increased from 6 to 7 on the pain scale, described as aching and discomfort, with pain remaining constant and unchanged; HYDROmorphone  was given PRN, but patient declined further interventions twice.

## 2024-02-05 NOTE — Progress Notes (Signed)
 Daily Progress Note    Assessment/Plan:    [name] [name] is a 23 y.o. female that presented to [facility] with GI bleed.    Acute on chronic LGI bleeding - Iron  Deficiency: Increased bleeding and pain for the past two weeks in the setting of prior colectomy and ileoanal anastamosis as below. Has known ulcers affecting her J pouch and history of prior GIBs.EGD and pouchoscopy 11/21 noted multiple (30-50) gastric polyps, no specimens collected and no evidence of pouchitis.  Biopsy did reveal adenoma (not high grade) that needs resection. 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 Loris.Most recently trialed on nirogacestat  though recently has been held due to diarrhea, bleeding   -Oncology aware of admission  -Breast u/s obtained on 11/22 and did not show evidence of fluid collections or infection, but a 'full diagnostic evaluation' would need to be considered via Breast Imaging Department as an outpatient     Acute on Chronic Pain: Follows with [facility]. Per pain plan:  -Continue home butrans  patch  -Oxycodone  20mg  Q4hr PRN, IV dilaudid  1 mg Q4hr PRN  -Baclofen  5mg  TID     Mild adrenal insufficiency: Tentative diagnosis during last admission  -Continue Hydrocortisone  10mg  daily, consider stress if hypotensive     GERD:  -continue home famotidine  20 mg nightly    Malnutrition   -RD consulted  -Supplements Depression/Anxiety:  -Continue home lexapro  5 mg daily   -Continue home ativan  0.5 mg PRN    Disposition: Home once bleeding improved vs [facility] transfer  Medically Clear: No    DVT Prophylaxis  Med Administrations and Associated Flowsheet Values (last 24 hours)       None          Peripheral Vascular Interventions (last 24 hours):  Anti-Embolism Device Status: Other (Comment) (bleeding risk)       I personally spent greater than 55 minutes face-to-face and non-face-to-face in the care of this patient, which includes all pre, intra, and post visit time on the date of service.  All documented time was specific to the E/M visit and does not include any procedures that may have been performed.  __________________________________________________________________    Subjective:    No acute events overnight. Reporting scratchy throat, RPP and strep negative yesterday. Reports around 12 bowel movements yesterday which is closer to her baseline, still having blood and abdominal pain. No other new symptoms. Oral intake remains limited     Objective    Temp:  [36.7 ??C (98.1 ??F)-36.8 ??C (98.2 ??F)] 36.8 ??C (98.2 ??F)  Pulse:  [79-93] 79  Resp:  [16-18] 16  BP: (103-119)/(58-72) 103/59  SpO2:  [96 %-98 %] 98 %    Chronically ill appearing female sitting upright in NAD, tired appearing  MMM  RRR without murmurs   Lugns CTA bilaterally   Abdomen soft, moderate periumbilical tenderness   No LE edema  Recent Results (from the past 24 hours)   Rapid Group A Strep Antigen    Collection Time: 02/04/24  1:22 PM    Specimen: Throat   Result Value Ref Range    Rapid Strep A Screen Negative Negative   CBC    Collection Time: 02/05/24  5:48 AM   Result Value Ref Range    WBC 6.3 3.6 - 11.2 10*9/L    RBC 3.49 (L) 3.95 - 5.13 10*12/L    HGB 9.6 (L) 11.3 - 14.9 g/dL    HCT 71.3 (L) 65.9 - 44.0 %    MCV 82.0 77.6 - 95.7 fL    MCH 27.5 25.9 - 32.4 pg    MCHC 33.5 32.0 - 36.0 g/dL    RDW 84.1 (H) 87.7 - 15.2 %    MPV 9.4 6.8 - 10.7 fL    Platelet 239 150 - 450 10*9/L

## 2024-02-05 NOTE — Plan of Care (Signed)
 Shift Summary  HYDROmorphone  was administered twice for abdominal pain, with pain scores fluctuating between 6 and 8.   Baclofen  was initially refused but later administered in the afternoon.   Ondansetron  was given once for nausea, with no further episodes documented.   Diphenoxylate -atropine  was administered twice during the shift.   Patient remained able to feed herself and transport by wheelchair, with stable vital signs and no changes in infection risk.    Readiness for Transition of Care: LTACH Candidacy Score remained stable at 75 throughout the shift, and patient was able to feed herself and transport by wheelchair without changes in status.    Optimal Pain Control and Function: Abdominal pain fluctuated between 6 and 8 on the pain scale, with HYDROmorphone  administered twice and baclofen  administered once after initial refusal; patient declined other interventions.    Nausea and Vomiting Relief: Ondansetron  was administered once for nausea, and no further documentation of nausea or vomiting was noted during the shift.    Absence of Infection Signs and Symptoms: Temperature remained within normal range and sepsis risk score was consistently zero throughout the shift.

## 2024-02-06 LAB — CBC
HEMATOCRIT: 29.7 % — ABNORMAL LOW (ref 34.0–44.0)
HEMOGLOBIN: 9.6 g/dL — ABNORMAL LOW (ref 11.3–14.9)
MEAN CORPUSCULAR HEMOGLOBIN CONC: 32.2 g/dL (ref 32.0–36.0)
MEAN CORPUSCULAR HEMOGLOBIN: 26.9 pg (ref 25.9–32.4)
MEAN CORPUSCULAR VOLUME: 83.4 fL (ref 77.6–95.7)
MEAN PLATELET VOLUME: 9.2 fL (ref 6.8–10.7)
PLATELET COUNT: 273 10*9/L (ref 150–450)
RED BLOOD CELL COUNT: 3.56 10*12/L — ABNORMAL LOW (ref 3.95–5.13)
RED CELL DISTRIBUTION WIDTH: 15.6 % — ABNORMAL HIGH (ref 12.2–15.2)
WBC ADJUSTED: 7.8 10*9/L (ref 3.6–11.2)

## 2024-02-06 MED ADMIN — acetaminophen (TYLENOL) tablet 1,000 mg: 1000 mg | ORAL | @ 15:00:00

## 2024-02-06 MED ADMIN — acetaminophen (TYLENOL) tablet 1,000 mg: 1000 mg | ORAL | @ 23:00:00

## 2024-02-06 MED ADMIN — acetaminophen (TYLENOL) tablet 1,000 mg: 1000 mg | ORAL | @ 08:00:00

## 2024-02-06 MED ADMIN — pantoprazole (Protonix) EC tablet 40 mg: 40 mg | ORAL | @ 23:00:00

## 2024-02-06 MED ADMIN — pantoprazole (Protonix) EC tablet 40 mg: 40 mg | ORAL | @ 13:00:00

## 2024-02-06 MED ADMIN — diphenhydrAMINE (BENADRYL) injection: 25 mg | INTRAVENOUS | @ 21:00:00

## 2024-02-06 MED ADMIN — diphenhydrAMINE (BENADRYL) injection: 25 mg | INTRAVENOUS | @ 08:00:00

## 2024-02-06 MED ADMIN — diphenhydrAMINE (BENADRYL) injection: 25 mg | INTRAVENOUS | @ 02:00:00

## 2024-02-06 MED ADMIN — diphenhydrAMINE (BENADRYL) injection: 25 mg | INTRAVENOUS | @ 15:00:00

## 2024-02-06 MED ADMIN — cetirizine (ZYRTEC) tablet 20 mg: 20 mg | ORAL | @ 02:00:00

## 2024-02-06 MED ADMIN — cetirizine (ZYRTEC) tablet 20 mg: 20 mg | ORAL | @ 13:00:00

## 2024-02-06 MED ADMIN — ondansetron (ZOFRAN) injection 4 mg: 4 mg | INTRAVENOUS | @ 15:00:00

## 2024-02-06 MED ADMIN — ondansetron (ZOFRAN) injection 4 mg: 4 mg | INTRAVENOUS | @ 04:00:00

## 2024-02-06 MED ADMIN — famotidine (PEPCID) tablet 20 mg: 20 mg | ORAL | @ 02:00:00

## 2024-02-06 MED ADMIN — oxyCODONE (ROXICODONE) 5 mg/5 mL solution 20 mg: 20 mg | ORAL | @ 06:00:00 | Stop: 2024-02-08

## 2024-02-06 MED ADMIN — oxyCODONE (ROXICODONE) 5 mg/5 mL solution 20 mg: 20 mg | ORAL | @ 18:00:00 | Stop: 2024-02-08

## 2024-02-06 MED ADMIN — oxyCODONE (ROXICODONE) 5 mg/5 mL solution 20 mg: 20 mg | ORAL | @ 09:00:00 | Stop: 2024-02-08

## 2024-02-06 MED ADMIN — oxyCODONE (ROXICODONE) 5 mg/5 mL solution 20 mg: 20 mg | ORAL | @ 13:00:00 | Stop: 2024-02-08

## 2024-02-06 MED ADMIN — oxyCODONE (ROXICODONE) 5 mg/5 mL solution 20 mg: 20 mg | ORAL | @ 02:00:00 | Stop: 2024-02-08

## 2024-02-06 MED ADMIN — oxyCODONE (ROXICODONE) 5 mg/5 mL solution 20 mg: 20 mg | ORAL | @ 21:00:00 | Stop: 2024-02-08

## 2024-02-06 MED ADMIN — diphenoxylate-atropine (LOMOTIL) 2.5-0.025 mg per tablet 2 tablet: 2 | ORAL | @ 11:00:00

## 2024-02-06 MED ADMIN — diphenoxylate-atropine (LOMOTIL) 2.5-0.025 mg per tablet 2 tablet: 2 | ORAL | @ 23:00:00

## 2024-02-06 MED ADMIN — diphenoxylate-atropine (LOMOTIL) 2.5-0.025 mg per tablet 2 tablet: 2 | ORAL | @ 18:00:00

## 2024-02-06 MED ADMIN — diphenoxylate-atropine (LOMOTIL) 2.5-0.025 mg per tablet 2 tablet: 2 | ORAL | @ 02:00:00

## 2024-02-06 MED ADMIN — hydrocortisone (CORTEF) tablet 10 mg: 10 mg | ORAL | @ 13:00:00

## 2024-02-06 MED ADMIN — baclofen (LIORESAL) tablet 5 mg: 5 mg | ORAL | @ 02:00:00

## 2024-02-06 MED ADMIN — HYDROmorphone (PF) (DILAUDID) injection 1 mg: 1 mg | INTRAVENOUS | Stop: 2024-02-10

## 2024-02-06 MED ADMIN — HYDROmorphone (PF) (DILAUDID) injection 1 mg: 1 mg | INTRAVENOUS | @ 04:00:00 | Stop: 2024-02-10

## 2024-02-06 MED ADMIN — HYDROmorphone (PF) (DILAUDID) injection 1 mg: 1 mg | INTRAVENOUS | @ 08:00:00 | Stop: 2024-02-10

## 2024-02-06 MED ADMIN — HYDROmorphone (PF) (DILAUDID) injection 1 mg: 1 mg | INTRAVENOUS | @ 19:00:00 | Stop: 2024-02-10

## 2024-02-06 MED ADMIN — escitalopram oxalate (LEXAPRO) tablet 5 mg: 5 mg | ORAL | @ 13:00:00

## 2024-02-06 MED ADMIN — scopolamine (TRANSDERM-SCOP) 1 mg over 3 days topical patch 1 mg: 1 | TOPICAL | @ 08:00:00

## 2024-02-06 MED ADMIN — sodium chloride (NS) 0.9 % infusion: 75 mL/h | INTRAVENOUS | @ 15:00:00 | Stop: 2024-02-07

## 2024-02-06 NOTE — Consults (Addendum)
 Massage Therapy Inpatient Consult Note    Patient: [name] [name]   Pronouns: she/her/hers    Date of birth: 2000-04-26    Service: Clinical Massage    Requesting Attending Physician :  Lynwood Sammie Gosling, MD  Service Requesting Consult : [facility])  Reason for Consult: relaxation    Visit Date: 02/06/2024    Precautions: No active isolations    Allergies:  Adhesive tape-silicones; Ferrlecit  [sodium ferric gluconat-sucrose]; Levofloxacin ; Methylnaltrexone ; Neomycin; Papaya; Morphine; Zosyn [piperacillin-tazobactam]; Adhesive; Compazine [prochlorperazine]; Iron  analogues; Metoclopramide ; Metronidazole ; Reglan  [metoclopramide  hcl]; Iron  dextran; and Latex, natural rubber    Code Status:  Full Code    ----------------------------------------------------------------    Visit Summary:  [name] [name] was happy and surprised to see me when I came in this evening :) Her dad, Chyrl, & mom, [name] were both sitting by her side AND her service dog, Layla, was sitting happily on the foot of her bed. She said she'd asked for a massage therapy consult when she got here (10 days ago), unfortunately it was dated 11/28 in the orders when she arrived on 11/19.      She described her pain as pretty well managed, at the moment a 5/10. I noticed a nickle sized raised, pink spot on her R cheekbone, which she said was likely residual hives and wasn't bothering her at that time.     Her sister, Larraine, had been with her the day before, and brought her new puppy, Gini (also a labradoodle), to visit. She seems to be doing really well emotionally, and today she'd gotten to have a music therapy session which she enjoyed.     She is worried about the new swollen lymph nodes & lump in her L breast. She described her L armpit/breast having some pain/irritation.     After our session, I walked with her around the unit a few times and she only felt slightly fatigued during. 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 She said that she often pushes herself to complete school-work when she feels physically exhausted due to her professors/university not understanding or providing accommodations for her limitations even though she has them in place on paper.     I usually check on patients weekly, but if I am able to come more often I will! Please feel free to send me a message on EPIC chat anytime they request a session! Thank you!     ----------------------------------------------------------------  I use a modified Walton Pressure Scale (WPS) in an effort to standardize documentation of this critical component of any touch therapy. My hope is to make the pressure I use clearly understandable to all team members involved in the care of each patient I am fortunate to work with.    Level 0: No pressure. Energy work or management consultant. (No tissue is moved)   Level zero is not included in the WPS, I added it for my practice.    Level 1: Light lotioning (Only the skin is moved)  Level 2: Heavy lotioning (Similar to rubbing in sunscreen. Slight displacement of the superficial adipose layer and superficial muscles)  Level 3: Medium pressure (Medium layers of adipose, muscle, and blood vessels are moved, adjacent tissues move slightly)    Levels 4 and 5 are only used with healthy patients. I do not go beyond a level 3 in my practice.    Level  4: Strong pressure (Deep layers of adipose, muscle, blood vessels, and fascia are moved)  Level 5: (Significant compression of all tissues. Therapists bone affects patient's bone)  ----------------------------------------------------------------    Treatment Goals: To assist with the management of pain and tension due to disease, treatment, and prolonged hospital stay.       Procedure Detail: Effleurage, Comfort touch, Acupressure, and Range of motion    Pressure Level: 0-2    Position: Semi-fowler    Areas of the body massaged: Head/Scalp, Neck, Shoulders, Upper extremities INcluding hands, Lateral pecs, focusing on humeral attachment, and Lower extremities INcluding feet      Areas of the body avoided:       Lubricant Used: Hospital lotion with essential oil added:Lavender    Music: TV on and Good conversation      Treatment Time: 1 hour    Total time spent on this patient's case today: 2+ hours    This is visit #: 20    ----------------------------------------------------------------  Lab Results   Component Value Date    PLT 273 02/06/2024     ----------------------------------------------------------------  Current/Recent Oncology Treatment: Surgery and Other:    Site Restrictions: Site Restrictions : Interrupted lymphatic pathway, Tumor, Ostomy, and Port: R chest    Position Restrictions: Position Restrictions: As comfort allows    PPE Used: Mask and Gloves    ----------------------------------------------------------------    Before treatment: (Mild / Moderate / Severe) or N/A (Not Assessed)           Pain: Mild and Moderate reported 5/10  Stress/Tension: Mild  Swelling: N/A      After treatment: (Better / Same / Worse) or N/A    Pain: N/A  Stress/Tension: Better  Swelling: N/A    ------------------------------------------------------------------    Surgical History:  Past Surgical History[1]    ----------------------------------------------------------------    Plan/Follow-Up:    A recommendation was made for Clinical Massage at least 1x per every 2 weeks      Patient resting in bed post-session, call light/all needs within reach, fall precautions in place.        I am deeply honored to be a part of this patient's care. Thank you so very much for the opportunity to work with them!  Please reach out with any questions or concerns. .................................................................................................................................................................................................................  There is no need to put in a new consult at this admission.   If this patient requests massage during this admission, you can send me a message on EPIC chat.   ..................................................................................................................................................................................................................  Please feel free to put in a new consult order for massage therapy at any subsequent admission. Thank you so much!   .................................................................................................................................................................................................         [1]   Past Surgical History:  Procedure Laterality Date    COLON SURGERY      cyroablation      cystis removal      desmoid removal      IR INSERT PORT AGE GREATER THAN 5 YRS  07/20/2023    IR INSERT PORT AGE GREATER THAN 5 YRS 07/20/2023 Guilford Hicks, MD IMG VIR [facility]    PR CLOSE ENTEROSTOMY,RESEC+ANAST N/A 10/09/2020    Procedure: ILEOSTOMY TAKEDOWN;  Surgeon: Evalene Karalee Shilling, MD;  Location: OR [place];  Service: General Surgery    PR COLONOSCOPY W/BIOPSY SINGLE/MULTIPLE N/A 10/27/2012    Procedure: COLONOSCOPY, FLEXIBLE, PROXIMAL TO SPLENIC FLEXURE; WITH BIOPSY, SINGLE OR MULTIPLE;  Surgeon: Annalee LABOR Mir, MD;  Location: PEDS PROCEDURE ROOM [facility];  Service: Gastroenterology  PR COLONOSCOPY W/BIOPSY SINGLE/MULTIPLE N/A 09/14/2013    Procedure: COLONOSCOPY, FLEXIBLE, PROXIMAL TO SPLENIC FLEXURE; WITH BIOPSY, SINGLE OR MULTIPLE;  Surgeon: Annalee LABOR Mir, MD;  Location: PEDS PROCEDURE ROOM [facility];  Service: Gastroenterology    PR COLONOSCOPY W/BIOPSY SINGLE/MULTIPLE N/A 11/08/2014    Procedure: COLONOSCOPY, FLEXIBLE, PROXIMAL TO SPLENIC FLEXURE; WITH BIOPSY, SINGLE OR MULTIPLE;  Surgeon: Annalee Dine Mir, MD;  Location: PEDS PROCEDURE ROOM [facility];  Service: Gastroenterology    PR COLONOSCOPY W/BIOPSY SINGLE/MULTIPLE N/A 12/26/2015    Procedure: COLONOSCOPY, FLEXIBLE, PROXIMAL TO SPLENIC FLEXURE; WITH BIOPSY, SINGLE OR MULTIPLE;  Surgeon: Annalee Dine Mir, MD;  Location: PEDS PROCEDURE ROOM [facility];  Service: Gastroenterology    PR COLONOSCOPY W/BIOPSY SINGLE/MULTIPLE N/A 09/02/2017    Procedure: COLONOSCOPY, FLEXIBLE, PROXIMAL TO SPLENIC FLEXURE; WITH BIOPSY, SINGLE OR MULTIPLE;  Surgeon: Annalee Dine Mir, MD;  Location: PEDS PROCEDURE ROOM [place];  Service: Gastroenterology    PR COLSC FLX W/REMOVAL LESION BY HOT BX FORCEPS N/A 08/27/2016    Procedure: COLONOSCOPY, FLEXIBLE, PROXIMAL TO SPLENIC FLEXURE; W/REMOVAL TUMOR/POLYP/OTHER LESION, HOT BX FORCEP/CAUTE;  Surgeon: Annalee Dine Mir, MD;  Location: PEDS PROCEDURE ROOM [facility];  Service: Gastroenterology    PR COLSC FLX W/RMVL OF TUMOR POLYP LESION SNARE TQ N/A 02/25/2019    Procedure: COLONOSCOPY FLEX; W/REMOV TUMOR/LES BY SNARE;  Surgeon: Lamar Jayson Ards, MD;  [facility] PROCEDURES [place] [facility];  Service: Gastroenterology    PR COLSC FLX W/RMVL OF TUMOR POLYP LESION SNARE TQ N/A 03/13/2020    Procedure: COLONOSCOPY FLEX; W/REMOV TUMOR/LES BY SNARE;  Surgeon: Lamar Jayson Ards, MD;  [facility]: [facility]EDURES [place] [facility];  Service: Gastroenterology    PR ENDOSCOPY UPPER SMALL INTESTINE N/A 07/15/2023    Procedure: SMALL INTESTINAL ENDOSCOPY, ENTEROSCOPY BEYOND SECOND PORTION OF DUODENUM, NOT INCL ILEUM; DX, INCL COLLECTION OF SPECIMEN(S) BY BRUSHING OR WASHING, WHEN PERFORMED;  Surgeon: Charlanne Kipper, MD;  Location: [facility] PROCEDURES [facility];  Service: Gastrointestinal    PR EXC SKIN BENIG 2.1-3 CM TRUNK,ARM,LEG Right 02/25/2017    Procedure: EXCISION, BENIGN LESION INCLUDE MARGINS, EXCEPT SKIN TAG, LEGS; EXCISED DIAMETER 2.1 TO 3.0 CM;  Surgeon: Jeyhan Suzan Wood, MD; Location: CHILDRENS OR [facility];  Service: Plastics    PR EXC SKIN BENIG 3.1-4 CM TRUNK,ARM,LEG Right 02/25/2017    Procedure: EXCISION, BENIGN LESION INCLUDE MARGINS, EXCEPT SKIN TAG, ARMS; EXCISED DIAMETER 3.1 TO 4.0 CM;  Surgeon: Jeyhan Suzan Wood, MD;  Location: CHILDRENS OR [facility];  Service: Plastics    PR EXC SKIN BENIG >4 CM FACE,FACIAL Right 02/25/2017    Procedure: EXCISION, OTHER BENIGN LES INCLUD MARGIN, FACE/EARS/EYELIDS/NOSE/LIPS/MUCOUS MEMBRANE; EXCISED DIAM >4.0 CM;  Surgeon: Jeyhan Suzan Wood, MD;  Location: CHILDRENS OR [facility];  Service: Plastics    PR EXC TUMOR SOFT TISSUE LEG/ANKLE SUBQ 3+CM Right 08/05/2019    Procedure: EXCISION, TUMOR, SOFT TISSUE OF LEG OR ANKLE AREA, SUBCUTANEOUS; 3 CM OR GREATER;  Surgeon: Delon Charmaine Myron, MD;  Location: MAIN OR [place];  Service: Plastics    PR EXC TUMOR SOFT TISSUE LEG/ANKLE SUBQ <3CM Right 08/05/2019    Procedure: EXCISION, TUMOR, SOFT TISSUE OF LEG OR ANKLE AREA, SUBCUTANEOUS; LESS THAN 3 CM;  Surgeon: Delon Charmaine Myron, MD;  Location: MAIN OR [facility];  Service: Plastics    PR LAP, SURG PROCTECTOMY W J-POUCH N/A 08/10/2020    Procedure: ROBOTIC ASSISTED LAPAROSCOPIC PROCTOCOLECTOMY, ILEAL J POUCH, WITH OSTOMY;  Surgeon: Evalene Karalee Shilling, MD;  Location: OR [place];  Service: General Surgery    PR NDSC EVAL INTSTINAL POUCH DX W/COLLJ SPEC SPX  N/A 01/23/2021    Procedure: ENDO EVAL SM INTEST POUCH; DX;  Surgeon: Mikle Cleora Martinez, MD;  Location: [facility]EDURES [place] [facility];  Service: Gastroenterology    PR NDSC EVAL INTSTINAL POUCH DX W/COLLJ SPEC SPX N/A 08/27/2021    Procedure: ENDO EVAL SM INTEST POUCH; DX;  Surgeon: Rodgers JAYSON Minor, MD;  [facility]: [facility]URES [facility];  Service: Gastroenterology    PR NDSC EVAL INTSTINAL POUCH DX W/COLLJ SPEC SPX N/A 12/09/2021    Procedure: ENDO EVAL SM INTEST POUCH; DX;  Surgeon: Sherrill Liliane Olmsted, MD;  Location: GI PROCEDURES [facility];  Service: Gastroenterology    PR NDSC EVAL INTSTINAL POUCH DX W/COLLJ [facility] SPX Left 04/09/2022    Procedure: ENDO EVAL SM INTEST POUCH; DX;  Surgeon: Martinez Mikle Cleora, MD;  Location: [facility]ED[facility] [place] [facility];  Service: Gastroenterology    PR NDSC EVAL INTSTINAL POUCH DX W/COLLJ SPEC SPX N/A 08/05/2022    Procedure: ENDO EVAL SM INTEST POUCH; DX;  Surgeon: Martinez Mikle Cleora, MD;  Location: GI PROCEDURES [facility];  Service: Gastroenterology    PR NDSC EVAL INTSTINAL POUCH DX W/COLLJ SPEC SPX N/A 03/13/2023    Procedure: ENDO EVAL SM INTEST POUCH; DX;  Surgeon: Rogerio Lauraine Sor, MD;  Location: GI PROCEDURES [facility];  Service: Gastroenterology    PR NDSC EVAL INTSTINAL POUCH DX W/COLLJ SPEC SPX N/A 07/06/2023    Procedure: ENDO EVAL SM INTEST POUCH; DX;  Surgeon: Junette Fonda CROME, MD;  Location: GI PROCEDURES [facility];  Service: Gastroenterology    PR NDSC EVAL INTSTINAL POUCH DX W/COLLJ SPEC SPX N/A 10/14/2023    Procedure: ENDO EVAL SM INTEST POUCH; DX;  Surgeon: Brenita Vannie BIRCH, MD;  Location: GI PROCEDURES [facility];  Service: Gastroenterology    PR NDSC EVAL INTSTINAL POUCH W/BX SINGLE/MULTIPLE N/A 01/20/2022    Procedure: ENDOSCOPIC EVAL OF SMALL INTESTINAL POUCH; DIAGNOSTIC, No biopsies;  Surgeon: Gwenn Dallas Ruth, MD;  Location: GI PROCEDURES [facility];  Service: Gastroenterology    PR NDSC EVAL INTSTINAL POUCH W/BX SINGLE/MULTIPLE N/A 02/13/2022    Procedure: ENDOSCOPIC EVAL OF SMALL INTESTINAL POUCH; DIAGNOSTIC, WITH BIOPSY;  Surgeon: Georgine Arlean Min, MD;  Location: GI PROCEDURES [facility];  Service: Gastroenterology    PR NDSC EVAL INTSTINAL POUCH W/BX SINGLE/MULTIPLE N/A 03/13/2023    Procedure: ENDOSCOPIC EVAL OF SMALL INTESTINAL POUCH; DIAGNOSTIC, WITH BIOPSY;  Surgeon: Rogerio Lauraine Sor, MD;  Location: GI PROCEDURES [facility];  Service: Gastroenterology    PR NDSC EVAL INTSTINAL POUCH W/BX SINGLE/MULTIPLE N/A 01/29/2024    Procedure: ENDOSCOPIC EVAL OF SMALL INTESTINAL POUCH; DIAGNOSTIC, WITH BIOPSY; Surgeon: Minor Rodgers JAYSON, MD;  Location: GI PROCEDURES [facility];  Service: Gastroenterology    PR UNLISTED PROCEDURE SMALL INTESTINE  01/23/2021    Procedure: UNLISTED PROCEDURE, SMALL INTESTINE;  Surgeon: Mikle Cleora Martinez, MD;  Location: [facility] [facility];  Service: Gastroenterology    PR UNLISTED PROCEDURE SMALL INTESTINE  02/13/2022    Procedure: UNLISTED PROCEDURE, SMALL INTESTINE;  Surgeon: Georgine Arlean Min, MD;  Location: GI PROCEDURES [facility];  Service: Gastroenterology    PR UPPER GI ENDOSCOPY,BIOPSY N/A 10/27/2012    Procedure: UGI ENDOSCOPY; WITH BIOPSY, SINGLE OR MULTIPLE;  Surgeon: Annalee LABOR Mir, MD;  Location: PEDS PROCEDURE ROOM [facility];  Service: Gastroenterology    PR UPPER GI ENDOSCOPY,BIOPSY N/A 09/14/2013    Procedure: UGI ENDOSCOPY; WITH BIOPSY, SINGLE OR MULTIPLE;  Surgeon: Annalee LABOR Mir, MD;  Location: PEDS PROCEDURE ROOM [facility];  Service: Gastroenterology    PR UPPER  GI ENDOSCOPY,BIOPSY N/A 11/08/2014    Procedure: UGI ENDOSCOPY; WITH BIOPSY, SINGLE OR MULTIPLE;  Surgeon: Annalee Dine Mir, MD;  Location: PEDS PROCEDURE ROOM [facility];  Service: Gastroenterology    PR UPPER GI ENDOSCOPY,BIOPSY N/A 12/26/2015    Procedure: UGI ENDOSCOPY; WITH BIOPSY, SINGLE OR MULTIPLE;  Surgeon: Annalee Dine Mir, MD;  Location: PEDS PROCEDURE ROOM [facility];  Service: Gastroenterology    PR UPPER GI ENDOSCOPY,BIOPSY N/A 08/27/2016    Procedure: UGI ENDOSCOPY; WITH BIOPSY, SINGLE OR MULTIPLE;  Surgeon: Annalee Dine Mir, MD;  Location: PEDS PROCEDURE ROOM [facility];  Service: Gastroenterology    PR UPPER GI ENDOSCOPY,BIOPSY N/A 09/02/2017    Procedure: UGI ENDOSCOPY; WITH BIOPSY, SINGLE OR MULTIPLE;  Surgeon: Annalee Dine Mir, MD;  Location: PEDS PROCEDURE ROOM [facility];  Service: Gastroenterology    PR UPPER GI ENDOSCOPY,BIOPSY N/A 03/13/2020    Procedure: UGI ENDOSCOPY; WITH BIOPSY, SINGLE OR MULTIPLE;  Surgeon: Lamar Jayson Ards, MD;  Location: [facility]EDURES [place] [facility];  Service: Gastroenterology    PR UPPER GI ENDOSCOPY,BIOPSY N/A 09/05/2021    Procedure: UGI ENDOSCOPY; WITH BIOPSY, SINGLE OR MULTIPLE;  Surgeon: Rosamond Never, MD;  Location: GI PROCEDURES [facility];  Service: Gastroenterology    PR UPPER GI ENDOSCOPY,DIAGNOSIS N/A 01/20/2022    Procedure: UGI ENDO, INCLUDE ESOPHAGUS, STOMACH, & DUODENUM &/OR JEJUNUM; DX W/WO COLLECTION SPECIMN, BY BRUSH OR WASH;  Surgeon: Gwenn Dallas Ruth, MD;  Location: GI PROCEDURES [facility];  Service: Gastroenterology    PR UPPER GI ENDOSCOPY,DIAGNOSIS N/A 01/29/2024    Procedure: UGI ENDO, INCLUDE ESOPHAGUS, STOMACH, & DUODENUM &/OR JEJUNUM; DX W/WO COLLECTION SPECIMN, BY BRUSH OR WASH;  Surgeon: Filbert Rodgers BROCKS, MD;  Location: GI PROCEDURES [facility];  Service: Gastroenterology    TUMOR REMOVAL      multiple-head, neck, back, hand, right flank, multiple

## 2024-02-06 NOTE — Progress Notes (Signed)
 Daily Progress Note    Assessment/Plan:    [name] [name] is a 23 y.o. female that presented to [facility] with GI bleed.    Acute on chronic LGI bleeding - Iron  Deficiency: Increased bleeding and pain for the past two weeks in the setting of prior colectomy and ileoanal anastamosis as below. Has known ulcers affecting her J pouch and history of prior GIBs.EGD and pouchoscopy 11/21 noted multiple (30-50) gastric polyps, no specimens collected and no evidence of pouchitis.  Biopsy did reveal adenoma (not high grade) that needs resection. She has had frequent bloody bowel movements the past 72 hours although seem to be getting closer to baseline (@10  per day) although Hgb has remained stable.   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 Loris.Most recently trialed on nirogacestat  though recently has been held due to diarrhea, bleeding   -Oncology aware of admission  -Breast u/s obtained on 11/22 and did not show evidence of fluid collections or infection, but a 'full diagnostic evaluation' would need to be considered via Breast Imaging Department as an outpatient     Acute on Chronic Pain: Follows with [facility] Pain management clinic. Per pain plan:  -Continue home butrans  patch  -Oxycodone  20mg  Q4hr PRN, IV dilaudid  1 mg Q4hr PRN  -Baclofen  5mg  TID     Mild adrenal insufficiency: Tentative diagnosis during last admission  -Continue Hydrocortisone  10mg  daily, consider stress if hypotensive     GERD:  -continue home famotidine  20 mg nightly    Malnutrition   -RD consulted  -Supplements   -Gentle fluids for poor intake Depression/Anxiety:  -Continue home lexapro  5 mg daily   -Continue home ativan  0.5 mg PRN    Disposition: Home once bleeding improved vs [facility] transfer  Medically Clear: No    DVT Prophylaxis  Med Administrations and Associated Flowsheet Values (last 24 hours)       None          Peripheral Vascular Interventions (last 24 hours):  Anti-Embolism Device Status: Other (Comment) (none. bleeding risk)       I personally spent greater than 55 minutes face-to-face and non-face-to-face in the care of this patient, which includes all pre, intra, and post visit time on the date of service.  All documented time was specific to the E/M visit and does not include any procedures that may have been performed.  __________________________________________________________________    Subjective:    No acute events overnight. Reports ongoing poor oral intake and feeling dehydrated. Has not mobilized much this hospital stay, encouraged OOB. Bowel frequency overall appears to be improving. HGB stable this morning.     Objective    Temp:  [36.8 ??C (98.2 ??F)-37 ??C (98.6 ??F)] 36.8 ??C (98.2 ??F)  Pulse:  [84-97] 84  Resp:  [14-16] 14  BP: (106-117)/(60-69) 106/69  SpO2:  [97 %-99 %] 98 %    Chronically ill appearing female sitting upright in NAD, tired appearing  MMM  RRR without murmurs   Lugns CTA bilaterally with normal WOB  Abdomen soft, moderate periumbilical tenderness and swelling . Mild dependent edema  No LE edema     Recent Results (from the past 24 hours)   CBC    Collection Time: 02/06/24  4:28 AM   Result Value Ref Range    WBC 7.8 3.6 - 11.2 10*9/L    RBC 3.56 (L) 3.95 - 5.13 10*12/L    HGB 9.6 (L) 11.3 - 14.9 g/dL    HCT 70.2 (L) 65.9 - 44.0 %    MCV 83.4 77.6 - 95.7 fL    MCH 26.9 25.9 - 32.4 pg    MCHC 32.2 32.0 - 36.0 g/dL    RDW 84.3 (H) 87.7 - 15.2 %    MPV 9.2 6.8 - 10.7 fL    Platelet 273 150 - 450 10*9/L

## 2024-02-06 NOTE — Consults (Signed)
 Music Therapy  Evaluation (02/06/24 1245)         Patient Name:  [name] [name] [facility]       Medical Record Number: [id number]   Date of Birth: 03/17/2000  Sex: Female          Room/Bed:  7120/7120-01  Payor Info:  Payor: BCBS / Plan: BCBS BLUE OPTIONS/PPO/ADV (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 Diella attends school at [employer] in [place]  and proudly reported finishing her clinicals this semseter. She receives support from her parents who live in [place].  Community Involvement/Potential Barriers: Rogelio's level of community involvement is limited by her consistent need for medical treatment. She reports enjoyment of her school and enjoyment of her church in [place]. She smiled as she spoke about her best friend. She shared that she has been in treatment often this year and shared about her medical journey since this time last year. Evony enjoys singing at her church and listening to music when she does not feel overstimulated.  Patient / Family Goals: To learn to play the guitar.  Historical Musical Involvement: Vocal, Church, Home, School (Played the violin in middle school and used to sing in church)  Music Preference: Country, Pop/Top 40, Contemporary Christian (Worship music, pop music, country music- Norman Cork, Medford Bent, Joesph Bracket)    Plan  Treatment Plan:   Patient/Family Education: Instructed patient in roles of therapy, Patient/Family demonstrated good verbal understanding of roles, goals, treatment plan  Recommendations: Pt appropriate for Music Therapy services  Areas of Concern: Coping, Level of social involvement, Functional strength  Music Therapy Goals will be addresed in: Individual setting  Treatment interventions to include: Coping Skills, Special Educational Needs Teacher, Music listening, Music discussion, Relaxation to music, Social Skills, Futures Trader, Stress Management, Verbal Processing, Therapeutic Music Lessons, Wellness / Recovery  Treatment Plan developed in collaboration with: Patient  Plan of Care:  Plan of Care Initiated: 02/06/24  Daily Frequency: 1x per day Weekly Frequency: 2-3x week   Planned Treatment Duration: 02/13/24  Session Summary:    Music therapy met with Azucena for music therapy evalaution and treatment session. Berlene presented as calm, cooperative, and pleasant, with parents present. She was easily engaged throughout. Deetra shared about her many hospitalizations throughout her life, sharing that she has maintained a good attitude because You just have to. She shared about when she used to play the violin and about how she used to sing in church, with her most recent time being in March of this year when she shared her testimony with the church. She expressed a desire to learn to play the guitar and engaged in a therapeutic guitar and [employer], learning three chords on each. She demonstrated a high frustration tolerance throughout and endorsed the desire to continue in future sessions. Meiling shared Flowers by Lucie Radford as a song that is meaningful to her and a song that  she would like to learn on the guitar. She engaged appropriately throughout, smiling and laughing often. Emelee would benefit from individual music therapy services promoting socialization, meaningful and self-directed behavior, music-based coping, and improved fine motor skills. Music therapy will continue to support.  Goals:  To promote meaningful self-directed behavior and increased self-esteem, Swati will participate in 30 minutes of active instrument playing/music listening with MT-BC for 3x sessions by 02/13/2024., Progress: 1/3  Plan: In Progress  To improve fine motor skills and dexterity, Wynema will participate in a 20 minute guitar/ukulele lesson for 3x sessions by 02/13/2024., Progress: 1/3  Plan: In Progress   ,          Subjective    Admitting Diagnosis: Hypernatremia [E87.0]  Chronic pain syndrome [G89.4]  Generalized abdominal pain [R10.84]  Diffuse myofascial pain syndrome [M79.18]  Cancer associated pain [G89.3]  Abdominal pain, unspecified abdominal location [R10.9]  Gastrointestinal hemorrhage, unspecified gastrointestinal hemorrhage type [K92.2]   Admit Date/Time:  01/27/2024  2:10 PM  Admission Comments: No comment available     Patient Active Problem List    Diagnosis Date Noted    Gastrostomy status    (CMS-HCC) 10/27/2023    Hypercoagulable state (HHS-HCC) 07/08/2023    Anxiety and depression 07/08/2023    Photophobia 07/05/2023    Chronic migraine with aura without status migrainosus, not intractable 07/05/2023    GI bleed 07/04/2023    C. difficile diarrhea 05/02/2023    Elevated liver transaminase level 04/01/2023    Pouchitis    (CMS-HCC) 03/20/2023    Dizziness 03/09/2023    Fatigue 03/09/2023    Iron  deficiency 02/25/2023    Insomnia 02/13/2023    Pain medication agreement signed 12/23/2022    Other acute postprocedural pain 09/25/2022    High risk medication use 09/17/2022    Urinary retention 08/07/2022    Anemia 06/13/2022    On total parenteral nutrition (TPN) 06/13/2022    Hypersensitivity reaction 04/08/2022    Small intestinal bacterial overgrowth (SIBO) 03/14/2022    Moderate episode of recurrent major depressive disorder (CMS-HCC) 03/13/2022    Generalized anxiety disorder with panic attacks 12/09/2021    Intractable abdominal pain 12/05/2021    Lower abdominal pain 08/27/2021    Winged scapula of right side 07/16/2021    Acute deep vein thrombosis (DVT) of axillary vein of right upper extremity (CMS-HCC) 03/20/2021    Severe protein-calorie malnutrition (HHS-HCC) 03/20/2021    Physical deconditioning 03/15/2021    History of colectomy 03/15/2021    SBO (small bowel obstruction) (CMS-HCC) 02/16/2021    Dehydration 10/14/2020    Iron  deficiency anemia due to chronic blood loss 11/10/2019    Cancer associated pain 08/04/2019    Chemotherapy-induced nausea 05/14/2019    Thyroid cyst 10/05/2018    [name] (familial adenomatous polyposis) 08/25/2012    Intestinal polyps 08/25/2012    [name] 08/25/2012    [name] of skin 08/25/2012    History of colonic polyps 01/09/2012    Other benign neoplasm of connective and other soft tissue of upper limb, including shoulder 02/26/2011    Benign neoplasm of skin of trunk 01/17/2004       Past Medical History[1]       Objective     Cognition:   Orientation: Oriented x 4     Social:  Patient Behaviors/Mood: Calm, Cooperative, Pleasant  Relationships / Involvement: Displays appropriate relationships with hospital staff, Reports appropriate relationships w/ family     Emotional:  Procedural Compliance: Complies with procedures independently  Anxiety: Does not display anxiety  Coping: Able to identify appropriate coping techniques, Reports active use of appropriate coping skills  Emotional Expression: Able to express emotions freely  Motivators: Music, Interaction with others     Physical:  Upper Extremity Strength: Sufficient strength to hold/play instruments  Upper Extremity Fine Motor: Sufficient fine motor to hold/play instruments  Equipment / Environment: Caregiver not wearing mask for full session, Patient not wearing mask for full session    I attest that I have reviewed the above information.  Signed: Kila Godina, MT-BC   Filed [1]   Past Medical History:  Diagnosis Date    Abdominal pain     Acid reflux     occas    Anesthesia complication     itching, shaking, coldness; last few surgeries have gone much better    Cancer    (CMS-HCC)     Cataract of right eye     COVID-19 virus infection 01/2019    Cyst of thyroid determined by ultrasound     monitoring    [name]     2 right forearm, 1 left thigh, 1 right scapula, 1 under left clavicle; multiple    Difficult intravenous access     [name] (familial adenomatous polyposis)     [name] (HHS-HCC)     Gastric polyps     History of chemotherapy     last treatment approx 05/2019    History of colon polyps     History of COVID-19 01/2019    Ileus    (CMS-HCC) 03/16/2022    Iron  deficiency anemia due to chronic blood loss     received iron  infusion 11-2019    PONV (postoperative nausea and vomiting)     Rectal bleeding     Syncopal episodes     especially if becoming dehydrated

## 2024-02-06 NOTE — Plan of Care (Signed)
 Pt admitted for GI bleed. A/Ox4, RA, VSS, afebrile, up ad lib. R subclavian chest port-a-cath, single hub in place, clean, dry, intact, and flushed per protocol. PRN meds given, see MAR. Continuous pulse ox monitoring in progress. Low bed, call bell within reach, fall precautions maintained. Family at bedside.     Shift Summary  Pain management interventions were provided twice with HYDROmorphone , but pain scores remained high and unchanged.    Diphenhydramine  was administered twice for allergy  symptoms.    Ondansetron  was given for nausea.    Oral care and CHG wipes were performed, supporting comfort and hygiene.    Overall, comfort and function were supported, but pain persisted at a high level.     Optimal Comfort and Wellbeing: Oral care was performed and CHG wipes were used; intake was limited but present, and psychosocial and neurological assessments remained within defined limits throughout the shift.     Absence of Allergy  Symptoms: Diphenhydramine  was administered twice during the shift for allergies.     Optimal Pain Control and Function: Pain remained chronic and constant/continuous, with scores fluctuating between 7 and 8; HYDROmorphone  was administered twice, but pain did not show improvement after intervention.     Nausea and Vomiting Relief: Ondansetron  was administered for nausea during the shift.

## 2024-02-06 NOTE — Plan of Care (Signed)
 Pt A&O x4. On RA. Up ad lib. Family at bedside. Continuous pulse ox ongoing. PRN IV benadryl  given for allergies, IV Zofran  given for nausea, and IV dilaudid  given for pain, with good effect. Pt worked with bout ART therapy and Music therapy today. VSS. Safety precautions maintained throughout the shift.   Shift Summary  HYDROmorphone  was given PRN for persistent pain, but pain remained significant and baclofen  was refused twice.   Ondansetron  was administered PRN for nausea, and diphenhydramine  was given for allergies.   Infection prevention and fall risk interventions were consistently maintained throughout the shift.   No new hospital-acquired illnesses, injuries, or falls were documented during the shift.   Overall, the shift was marked by ongoing pain management needs and continued implementation of safety and infection prevention measures.    Absence of Hospital-Acquired Illness or Injury: No new hospital-acquired illnesses or injuries were documented during the shift, and infection prevention measures were consistently implemented. Sepsis risk scores remained unchanged throughout the shift.    Optimal Comfort and Wellbeing: Oral care was performed and nutrition was documented as adequate, but ongoing abdominal, pelvic, and wrist pain persisted and comfort interventions were limited by patient refusal of baclofen .    Optimal Pain Control and Function: Pain was reported as constant and aching in the abdomen, pelvis, and wrist, with a pain score of 8; HYDROmorphone  was administered PRN, but pain remained above the patient's stated goal of no pain.    Absence of Fall and Fall-Related Injury: Fall prevention strategies were maintained throughout the shift, including environmental modifications, frequent toileting, hourly visual checks, and 3/4 side rails up, with no falls documented.    Nausea and Vomiting Relief: Ondansetron  was administered PRN for nausea during the shift.

## 2024-02-07 LAB — BASIC METABOLIC PANEL
ANION GAP: 8 mmol/L (ref 5–14)
BLOOD UREA NITROGEN: 9 mg/dL (ref 9–23)
BUN / CREAT RATIO: 15
CALCIUM: 8.5 mg/dL — ABNORMAL LOW (ref 8.7–10.4)
CHLORIDE: 105 mmol/L (ref 98–107)
CO2: 29 mmol/L (ref 20.0–31.0)
CREATININE: 0.61 mg/dL (ref 0.55–1.02)
EGFR CKD-EPI (2021) FEMALE: >90 mL/min/1.73m2 (ref >=60)
GLUCOSE RANDOM: 110 mg/dL (ref 70–179)
POTASSIUM: 3.8 mmol/L (ref 3.4–4.8)
SODIUM: 142 mmol/L (ref 135–145)

## 2024-02-07 LAB — CBC
HEMATOCRIT: 27.5 % — ABNORMAL LOW (ref 34.0–44.0)
HEMOGLOBIN: 9.2 g/dL — ABNORMAL LOW (ref 11.3–14.9)
MEAN CORPUSCULAR HEMOGLOBIN CONC: 33.3 g/dL (ref 32.0–36.0)
MEAN CORPUSCULAR HEMOGLOBIN: 27.8 pg (ref 25.9–32.4)
MEAN CORPUSCULAR VOLUME: 83.4 fL (ref 77.6–95.7)
MEAN PLATELET VOLUME: 9.5 fL (ref 6.8–10.7)
PLATELET COUNT: 227 10*9/L (ref 150–450)
RED BLOOD CELL COUNT: 3.3 10*12/L — ABNORMAL LOW (ref 3.95–5.13)
RED CELL DISTRIBUTION WIDTH: 16.4 % — ABNORMAL HIGH (ref 12.2–15.2)
WBC ADJUSTED: 6.4 10*9/L (ref 3.6–11.2)

## 2024-02-07 MED ADMIN — acetaminophen (TYLENOL) tablet 1,000 mg: 1000 mg | ORAL | @ 22:00:00

## 2024-02-07 MED ADMIN — acetaminophen (TYLENOL) tablet 1,000 mg: 1000 mg | ORAL | @ 14:00:00

## 2024-02-07 MED ADMIN — acetaminophen (TYLENOL) tablet 1,000 mg: 1000 mg | ORAL | @ 08:00:00

## 2024-02-07 MED ADMIN — pantoprazole (Protonix) EC tablet 40 mg: 40 mg | ORAL | @ 22:00:00

## 2024-02-07 MED ADMIN — pantoprazole (Protonix) EC tablet 40 mg: 40 mg | ORAL | @ 13:00:00

## 2024-02-07 MED ADMIN — diphenhydrAMINE (BENADRYL) injection: 25 mg | INTRAVENOUS | @ 17:00:00

## 2024-02-07 MED ADMIN — diphenhydrAMINE (BENADRYL) injection: 25 mg | INTRAVENOUS | @ 04:00:00

## 2024-02-07 MED ADMIN — diphenhydrAMINE (BENADRYL) injection: 25 mg | INTRAVENOUS | @ 22:00:00

## 2024-02-07 MED ADMIN — diphenhydrAMINE (BENADRYL) injection: 25 mg | INTRAVENOUS | @ 10:00:00

## 2024-02-07 MED ADMIN — cetirizine (ZYRTEC) tablet 20 mg: 20 mg | ORAL | @ 14:00:00

## 2024-02-07 MED ADMIN — cetirizine (ZYRTEC) tablet 20 mg: 20 mg | ORAL | @ 01:00:00

## 2024-02-07 MED ADMIN — ondansetron (ZOFRAN) injection 4 mg: 4 mg | INTRAVENOUS | @ 04:00:00

## 2024-02-07 MED ADMIN — ondansetron (ZOFRAN) injection 4 mg: 4 mg | INTRAVENOUS | @ 17:00:00

## 2024-02-07 MED ADMIN — famotidine (PEPCID) tablet 20 mg: 20 mg | ORAL | @ 01:00:00

## 2024-02-07 MED ADMIN — oxyCODONE (ROXICODONE) 5 mg/5 mL solution 20 mg: 20 mg | ORAL | @ 14:00:00 | Stop: 2024-02-08

## 2024-02-07 MED ADMIN — oxyCODONE (ROXICODONE) 5 mg/5 mL solution 20 mg: 20 mg | ORAL | @ 21:00:00 | Stop: 2024-02-08

## 2024-02-07 MED ADMIN — oxyCODONE (ROXICODONE) 5 mg/5 mL solution 20 mg: 20 mg | ORAL | @ 06:00:00 | Stop: 2024-02-08

## 2024-02-07 MED ADMIN — oxyCODONE (ROXICODONE) 5 mg/5 mL solution 20 mg: 20 mg | ORAL | @ 01:00:00 | Stop: 2024-02-08

## 2024-02-07 MED ADMIN — oxyCODONE (ROXICODONE) 5 mg/5 mL solution 20 mg: 20 mg | ORAL | @ 10:00:00 | Stop: 2024-02-08

## 2024-02-07 MED ADMIN — diphenoxylate-atropine (LOMOTIL) 2.5-0.025 mg per tablet 2 tablet: 2 | ORAL | @ 17:00:00

## 2024-02-07 MED ADMIN — diphenoxylate-atropine (LOMOTIL) 2.5-0.025 mg per tablet 2 tablet: 2 | ORAL | @ 22:00:00

## 2024-02-07 MED ADMIN — diphenoxylate-atropine (LOMOTIL) 2.5-0.025 mg per tablet 2 tablet: 2 | ORAL | @ 12:00:00

## 2024-02-07 MED ADMIN — hydrocortisone (CORTEF) tablet 10 mg: 10 mg | ORAL | @ 14:00:00

## 2024-02-07 MED ADMIN — HYDROmorphone (PF) (DILAUDID) injection 1 mg: 1 mg | INTRAVENOUS | @ 13:00:00 | Stop: 2024-02-10

## 2024-02-07 MED ADMIN — HYDROmorphone (PF) (DILAUDID) injection 1 mg: 1 mg | INTRAVENOUS | @ 17:00:00 | Stop: 2024-02-10

## 2024-02-07 MED ADMIN — HYDROmorphone (PF) (DILAUDID) injection 1 mg: 1 mg | INTRAVENOUS | @ 08:00:00 | Stop: 2024-02-10

## 2024-02-07 MED ADMIN — HYDROmorphone (PF) (DILAUDID) injection 1 mg: 1 mg | INTRAVENOUS | @ 23:00:00 | Stop: 2024-02-10

## 2024-02-07 MED ADMIN — escitalopram oxalate (LEXAPRO) tablet 5 mg: 5 mg | ORAL | @ 14:00:00

## 2024-02-07 MED ADMIN — sodium chloride (NS) 0.9 % infusion: 75 mL/h | INTRAVENOUS | @ 04:00:00 | Stop: 2024-02-07

## 2024-02-07 NOTE — Plan of Care (Signed)
 Shift Summary  A&Ox4. Pt reports one incontinent stool episode during the night. PRN's given as she requested them for pain and nausea. She did sleep off and on in between meds.  HYDROmorphone  was administered PRN at 3:19 AM for persistent pain, but pain scores remained elevated after administration.   Diphenhydramine  and ondansetron  were given PRN for allergies and nausea, respectively, with no documented adverse reactions.   Multiple pain interventions were declined or not effective, and chronic pain persisted throughout the shift.   Fall prevention and skin integrity measures were consistently implemented, and no falls or skin breakdown were documented.   Overall, pain control remained a challenge, but safety and skin health were maintained during the shift.    Optimal Comfort and Wellbeing: Pain remained elevated throughout the shift, with scores of 7 and 8 reported, and emotional support provided as intervention; HYDROmorphone  was administered PRN in the early morning, but pain scores did not decrease after administration.    Absence of Allergy  Symptoms: Diphenhydramine  was given PRN for allergies, and there were no documented reports of allergy  symptoms during the shift.    Optimal Pain Control and Function: Despite emotional support and a PRN dose of HYDROmorphone , pain scores remained high and unchanged, with chronic right lower pain reported and no improvement noted after interventions.    Absence of Fall and Fall-Related Injury: Fall reduction strategies were consistently maintained, including frequent toileting, hourly visual checks, and low bed positioning, with no falls or injuries documented during the shift.    Skin Health and Integrity: Skin remained within defined limits, with frequent pressure reduction techniques, use of a pressure-redistributing mattress, and limited adhesive use documented throughout the shift.

## 2024-02-07 NOTE — Plan of Care (Signed)
 Shift Summary  Pain was managed with multiple administrations of HYDROmorphone  and oxyCODONE , resulting in temporary relief but recurring pain later in the shift.    Diphenhydramine  was given twice for allergy  symptoms, with no further allergic reactions documented after administration.    Fall prevention interventions were consistently implemented, including scheduled toileting and hourly checks, with no falls or injuries reported.    Infection prevention and aseptic technique were maintained, and sepsis risk scores remained low throughout the shift.    Overall, the patient remained engaged in care and interventions, with stable skin integrity and no hospital-acquired complications noted.     Absence of Hospital-Acquired Illness or Injury: Temperature remained stable and within normal limits throughout the shift, and infection prevention measures such as aseptic technique and environmental surveillance were consistently maintained. Sepsis risk scores stayed very low or at zero for most of the shift, with no notable increases.     Absence of Allergy  Symptoms: Diphenhydramine  was administered twice for allergy  symptoms, with no further documentation of allergic reactions after administration.     Optimal Pain Control and Function: Abdominal pain fluctuated, with periods of high pain (8/10) and intervals of complete relief (0/10) following administration of HYDROmorphone ; pain returned later in the shift, requiring repeated intervention.     Absence of Fall and Fall-Related Injury: Fall reduction strategies were maintained, including frequent toileting, hourly visual checks, and environmental modifications, with no documentation of falls or injuries.     Skin Health and Integrity: Skin remained within defined limits and Braden Scale scores were stable, with frequent weight shifts encouraged to support skin integrity.

## 2024-02-07 NOTE — Progress Notes (Signed)
 Daily Progress Note    Assessment/Plan:    [name] [name] is a 23 y.o. female that presented to [facility] with GI bleed.    Acute on chronic LGI bleeding - Iron  Deficiency: Increased bleeding and pain for the past two weeks in the setting of prior colectomy and ileoanal anastamosis as below. Has known ulcers affecting her J pouch and history of prior GIBs.EGD and pouchoscopy 11/21 noted multiple (30-50) gastric polyps, no specimens collected and no evidence of pouchitis.  Biopsy did reveal adenoma (not high grade) that needs resection. BM frequency getting closer to baseline (@10  per day) and Hgb has remained stable.   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 Loris.Most recently trialed on nirogacestat  though recently has been held due to diarrhea, bleeding   -Oncology aware of admission  -Breast u/s obtained on 11/22 and did not show evidence of fluid collections or infection, but a 'full diagnostic evaluation' would need to be considered via Breast Imaging Department as an outpatient  -US  of abdomen to ensure no changes to rectus sheath tumor/bleeding given ongoing pain. She was told previously that unclear if would be a target for cryoablation although need to follow up at [facility]     Acute on Chronic Pain: Follows with [facility] management clinic. Per pain plan:  -Continue home butrans  patch  -Oxycodone  20mg  Q4hr PRN, IV dilaudid  1 mg Q4hr PRN  -Baclofen  5mg  TID     Mild adrenal insufficiency: Tentative diagnosis during last admission  -Continue Hydrocortisone  10mg  daily, consider stress if hypotensive     GERD:  -continue home famotidine  20 mg nightly    Malnutrition   -RD consulted  -Supplements   -Gentle fluids for poor intake      Depression/Anxiety:  -Continue home lexapro  5 mg daily   -Continue home ativan  0.5 mg PRN    Disposition: Home once bleeding improved vs [facility] transfer  Medically Clear: No    DVT Prophylaxis  Med Administrations and Associated Flowsheet Values (last 24 hours)       None          Peripheral Vascular Interventions (last 24 hours):  Anti-Embolism Device Status: Other (Comment) (bleeding risk)       I personally spent greater than 55 minutes face-to-face and non-face-to-face in the care of this patient, which includes all pre, intra, and post visit time on the date of service.  All documented time was specific to the E/M visit and does not include any procedures that may have been performed.  __________________________________________________________________    Subjective:    No acute events overnight. Symptoms are overall stable from yesterday with ongoing bloody stool and similar frequency Ongoing pain in the epigastric and RUQ. Oral intake remains poor     Objective    Temp:  [36.7 ??C (98.1 ??F)-36.8 ??C (98.2 ??F)] (P) 36.7 ??C (98.1 ??F)  Pulse:  [78-92] (P) 78  Resp:  [15-16] 15  BP: (102-112)/(53-57) (P) 102/61  SpO2:  [96 %-99 %] (P) 99 %    Chronically ill appearing female sitting upright in NAD, tired appearing  MMM  RRR without murmurs   Lugns CTA bilaterally with normal WOB  Abdomen soft, moderate periumbilical tenderness and swelling . Mild dependent edema   No LE edema     Recent Results (from the past 24 hours)   CBC    Collection Time: 02/07/24  5:05 AM   Result Value Ref Range    WBC 6.4 3.6 - 11.2 10*9/L    RBC 3.30 (L) 3.95 - 5.13 10*12/L    HGB 9.2 (L) 11.3 - 14.9 g/dL    HCT 72.4 (L) 65.9 - 44.0 %    MCV 83.4 77.6 - 95.7 fL    MCH 27.8 25.9 - 32.4 pg    MCHC 33.3 32.0 - 36.0 g/dL    RDW 83.5 (H) 87.7 - 15.2 %    MPV 9.5 6.8 - 10.7 fL    Platelet 227 150 - 450 10*9/L   Basic Metabolic Panel Collection Time: 02/07/24  5:05 AM   Result Value Ref Range    Sodium 142 135 - 145 mmol/L    Potassium 3.8 3.4 - 4.8 mmol/L    Chloride 105 98 - 107 mmol/L    CO2 29.0 20.0 - 31.0 mmol/L    Anion Gap 8 5 - 14 mmol/L    BUN 9 9 - 23 mg/dL    Creatinine 9.38 9.44 - 1.02 mg/dL    BUN/Creatinine Ratio 15     eGFR CKD-EPI (2021) Female >90 >=60 mL/min/1.82m2    Glucose 110 70 - 179 mg/dL    Calcium 8.5 (L) 8.7 - 10.4 mg/dL

## 2024-02-08 DIAGNOSIS — D48119 Desmoid tumor: Principal | ICD-10-CM

## 2024-02-08 LAB — CBC
HEMATOCRIT: 29.4 % — ABNORMAL LOW (ref 34.0–44.0)
HEMOGLOBIN: 9.5 g/dL — ABNORMAL LOW (ref 11.3–14.9)
MEAN CORPUSCULAR HEMOGLOBIN CONC: 32.2 g/dL (ref 32.0–36.0)
MEAN CORPUSCULAR HEMOGLOBIN: 27.1 pg (ref 25.9–32.4)
MEAN CORPUSCULAR VOLUME: 84.3 fL (ref 77.6–95.7)
MEAN PLATELET VOLUME: 9.6 fL (ref 6.8–10.7)
PLATELET COUNT: 252 10*9/L (ref 150–450)
RED BLOOD CELL COUNT: 3.49 10*12/L — ABNORMAL LOW (ref 3.95–5.13)
RED CELL DISTRIBUTION WIDTH: 17 % — ABNORMAL HIGH (ref 12.2–15.2)
WBC ADJUSTED: 6.9 10*9/L (ref 3.6–11.2)

## 2024-02-08 MED ADMIN — acetaminophen (TYLENOL) tablet 1,000 mg: 1000 mg | ORAL | @ 08:00:00

## 2024-02-08 MED ADMIN — acetaminophen (TYLENOL) tablet 1,000 mg: 1000 mg | ORAL

## 2024-02-08 MED ADMIN — acetaminophen (TYLENOL) tablet 1,000 mg: 1000 mg | ORAL | @ 14:00:00

## 2024-02-08 MED ADMIN — pantoprazole (Protonix) EC tablet 40 mg: 40 mg | ORAL | @ 22:00:00

## 2024-02-08 MED ADMIN — pantoprazole (Protonix) EC tablet 40 mg: 40 mg | ORAL | @ 14:00:00

## 2024-02-08 MED ADMIN — diphenhydrAMINE (BENADRYL) injection: 25 mg | INTRAVENOUS | @ 21:00:00

## 2024-02-08 MED ADMIN — diphenhydrAMINE (BENADRYL) injection: 25 mg | INTRAVENOUS | @ 10:00:00

## 2024-02-08 MED ADMIN — diphenhydrAMINE (BENADRYL) injection: 25 mg | INTRAVENOUS | @ 04:00:00

## 2024-02-08 MED ADMIN — diphenhydrAMINE (BENADRYL) injection: 25 mg | INTRAVENOUS | @ 16:00:00

## 2024-02-08 MED ADMIN — cetirizine (ZYRTEC) tablet 20 mg: 20 mg | ORAL | @ 14:00:00

## 2024-02-08 MED ADMIN — cetirizine (ZYRTEC) tablet 20 mg: 20 mg | ORAL | @ 01:00:00

## 2024-02-08 MED ADMIN — ondansetron (ZOFRAN) injection 4 mg: 4 mg | INTRAVENOUS | @ 16:00:00

## 2024-02-08 MED ADMIN — ondansetron (ZOFRAN) injection 4 mg: 4 mg | INTRAVENOUS | @ 04:00:00

## 2024-02-08 MED ADMIN — famotidine (PEPCID) tablet 20 mg: 20 mg | ORAL | @ 02:00:00

## 2024-02-08 MED ADMIN — oxyCODONE (ROXICODONE) 5 mg/5 mL solution 20 mg: 20 mg | ORAL | @ 18:00:00 | Stop: 2024-02-08

## 2024-02-08 MED ADMIN — oxyCODONE (ROXICODONE) 5 mg/5 mL solution 20 mg: 20 mg | ORAL | @ 01:00:00 | Stop: 2024-02-08

## 2024-02-08 MED ADMIN — oxyCODONE (ROXICODONE) 5 mg/5 mL solution 20 mg: 20 mg | ORAL | Stop: 2024-02-08

## 2024-02-08 MED ADMIN — oxyCODONE (ROXICODONE) 5 mg/5 mL solution 20 mg: 20 mg | ORAL | @ 14:00:00 | Stop: 2024-02-08

## 2024-02-08 MED ADMIN — oxyCODONE (ROXICODONE) 5 mg/5 mL solution 20 mg: 20 mg | ORAL | @ 10:00:00 | Stop: 2024-02-08

## 2024-02-08 MED ADMIN — oxyCODONE (ROXICODONE) 5 mg/5 mL solution 20 mg: 20 mg | ORAL | @ 05:00:00 | Stop: 2024-02-08

## 2024-02-08 MED ADMIN — diphenoxylate-atropine (LOMOTIL) 2.5-0.025 mg per tablet 2 tablet: 2 | ORAL | @ 13:00:00

## 2024-02-08 MED ADMIN — diphenoxylate-atropine (LOMOTIL) 2.5-0.025 mg per tablet 2 tablet: 2 | ORAL | @ 18:00:00

## 2024-02-08 MED ADMIN — diphenoxylate-atropine (LOMOTIL) 2.5-0.025 mg per tablet 2 tablet: 2 | ORAL | @ 02:00:00

## 2024-02-08 MED ADMIN — diphenoxylate-atropine (LOMOTIL) 2.5-0.025 mg per tablet 2 tablet: 2 | ORAL | @ 21:00:00

## 2024-02-08 MED ADMIN — hydrocortisone (CORTEF) tablet 10 mg: 10 mg | ORAL | @ 14:00:00

## 2024-02-08 MED ADMIN — HYDROmorphone (PF) (DILAUDID) injection 1 mg: 1 mg | INTRAVENOUS | @ 13:00:00 | Stop: 2024-02-10

## 2024-02-08 MED ADMIN — HYDROmorphone (PF) (DILAUDID) injection 1 mg: 1 mg | INTRAVENOUS | @ 03:00:00 | Stop: 2024-02-10

## 2024-02-08 MED ADMIN — HYDROmorphone (PF) (DILAUDID) injection 1 mg: 1 mg | INTRAVENOUS | @ 08:00:00 | Stop: 2024-02-10

## 2024-02-08 MED ADMIN — HYDROmorphone (PF) (DILAUDID) injection 1 mg: 1 mg | INTRAVENOUS | @ 18:00:00 | Stop: 2024-02-10

## 2024-02-08 MED ADMIN — escitalopram oxalate (LEXAPRO) tablet 5 mg: 5 mg | ORAL | @ 14:00:00

## 2024-02-08 MED ADMIN — lactated Ringers infusion: 100 mL/h | INTRAVENOUS | @ 21:00:00 | Stop: 2024-02-09

## 2024-02-08 NOTE — Plan of Care (Signed)
 Shift Summary  Hydromorphone  was administered twice for severe abdominal pain, with temporary improvement followed by recurrence later in the shift.  Ondansetron  was given for nausea, and diphenhydramine  was administered twice for allergy  symptoms, with no further symptoms documented after administration.  Safety and infection prevention measures were maintained, and no new hospital-acquired issues were documented.  Skin integrity was preserved and nutrition remained adequate throughout the shift.  Overall, pain management and allergy  symptom control required multiple interventions, but no new complications were documented during the shift.    Absence of Hospital-Acquired Illness or Injury: No new hospital-acquired issues were documented during the shift, and infection prevention and safety interventions were consistently maintained, including aseptic technique, environmental surveillance, and fall reduction strategies.    Absence of Allergy  Symptoms: Diphenhydramine  was given twice for allergies, and no symptoms were documented after administration.    Optimal Pain Control and Function: Abdominal pain was initially severe but improved to 0 after hydromorphone , then returned to severe levels later in the shift, requiring another dose; pain was described as both chronic and acute at different times.    Skin Health and Integrity: Skin remained within defined limits and Braden score indicated low risk for breakdown; nutrition was adequate throughout the shift.    Nausea and Vomiting Relief: Ondansetron  was administered PRN, and no further symptoms of nausea or vomiting were documented after administration.

## 2024-02-08 NOTE — Progress Notes (Signed)
 Daily Progress Note    Assessment/Plan:    [name] [name] is a 23 y.o. 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    Acute on chronic LGI bleeding - Iron  Deficiency: Increased bleeding and pain for the past two weeks in the setting of prior colectomy and ileoanal anastamosis as below. Has had prior ulcers affecting her J pouch and history of prior GIBs. EGD and pouchoscopy 11/21 noted multiple (30-50) gastric polyps, no specimens collected and no evidence of pouchitis.  Biopsy did reveal adenoma (not high grade) that will need resection. She continues to have frequent BMs (~10/day) and Hgb has remained stable in 9s.   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 Loris. Most recently trialed on nirogacestat , though recently, this has been held due to diarrhea and bleeding.  -Oncology aware of admission  -Breast u/s obtained on 11/22 and did not show evidence of fluid collections or infection, but a 'full diagnostic evaluation' would need to be considered via Breast Imaging Department as an outpatient  -Obtained US  of abdomen 12/1 to ensure no changes to rectus sheath tumor/bleeding given ongoing pain. This showed no significant change from last CT scan.      Acute on Chronic Pain: Follows with [facility]. Per pain plan:  -Continue home Butrans  patch  -Oxycodone  20mg  Q4hr PRN, IV dilaudid  1 mg Q4hr PRN  -Baclofen  5mg  TID     Mild adrenal insufficiency: Tentative diagnosis during last admission  -Continue Hydrocortisone  10mg  daily, consider stress dosing if hypotensive     GERD:  -Continue home famotidine  20 mg nightly    Malnutrition - Poor PO Intake  -RD consulted  -Supplements   -Gentle fluids for poor intake: resumed LR @ 100 /hr x 20 hrs on 12/1     Depression/Anxiety:  -Appreciate Psych recs  -Continue home Lexapro  5 mg daily   -Continue home Ativan  0.5 mg PRN      Disposition: Home once lightheadedness and pain improved   Medically Clear: No    DVT Prophylaxis - encourage ambulation  Diet: Regular        I personally spent greater than 55 minutes face-to-face and non-face-to-face in the care of this patient, which includes all pre, intra, and post visit time on the date of service.  All documented time was specific to the E/M visit and does not include any procedures that may have been performed.  __________________________________________________________________    Subjective:    I discussed her case with [facility] transfer center - they decline inpatient transfer and recommend outpatient follow up.    I shared this with the patient and her mother - both are frustrated, noting that she has ongoing significant pain and lightheadedness, which led to syncopal episodes and POTS-like symptoms (per her mother). They were really hoping to get expedited balloon enteroscopy. They request more IV fluids, as her PO intake has been poor.     She has ongoing bloody  BMs, and I reviewed the images on her mother's phone. Hgb remains stable, but she has severe pain with eating and does not feel at all safe to return home at this time.       Reviewed all available labs and imaging from last 24 hrs.      Objective    Temp:  [36.6 ??C (97.9 ??F)-36.9 ??C (98.4 ??F)] 36.9 ??C (98.4 ??F)  Pulse:  [81-108] 108  Resp:  [18] 18  BP: (94-116)/(54-60) 94/54  SpO2:  [98 %-100 %] 100 %    Chronically ill appearing female sitting upright in NAD, tired appearing, appropriate MMM  RRR without murmurs   Lugns CTA bilaterally with normal WOB  Abdomen soft, moderate periumbilical tenderness and swelling, especially extending to R flank  No LE edema, extremities are warm

## 2024-02-08 NOTE — Consults (Signed)
 CVAD Liaison - Port Access Note    Indications:   [employer]    The CVAD Liaison has assessed this patient's port site. Appropriate Huber needle size was obtained.  Port was accessed and dressing applied per protocol.  Blood return was noted.  Line flushes freely.       The primary RN was notified.     Thank you for this consult,  Caren Hazy, RN, CVAD Liaison     Consult Time 30 minutes (min)

## 2024-02-08 NOTE — Telephone Encounter (Signed)
 Copied from CRM #[id number]. Topic: Return Scheduling - Return Request  >> Feb 08, 2024 10:45 AM Nanetta SAILOR wrote:      Hello,    We received a call regarding patient [name] [name]. [name] (mother) is requesting the following return appointments: infusion that is scheduled for tomorrow 12/2 be cancelled as her daughter is still enlisted in the hospital here at [facility].    We are unable to schedule this appointment because an infusion is involved in the scheduling. Please reach out to the [facility]  to provide further assistance [phone].    Thank you,   Nanetta CHRISTELLA Seeds  [facility]  [phone]

## 2024-02-08 NOTE — Consults (Signed)
 [facility])   Consultation - Liaison (CL) Psychiatry  CL Psychology Follow-Up Note      Service Date:  02/08/24  Admit Date:  01/28/24  Clinician:  Olam Littles, PsyD  Intervention: 60 min Individual Psychotherapy  Face-to-Face Clinical Contact with Patient: 57 min  Pre-/Post-Encounter Activities: 30 min  Method of Interaction: In-Person    BACKGROUND INFORMATION AND REASON FOR REFERRAL:  Please see H&P for full details. 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 She was admitted to the Hospitalist service for management of the concerns above. Pt is well-known to clinical research associate from previous admissions. Please see prior notes for further details on psychosocial history. Psychology consulted to address psychological factors affecting Pt's medical condition.     Interval events:  -No significant adverse behavioral/psychiatric events since last seen by Psychology   -Per recent records, Pt continues to have ongoing GI bleeding and poor PO intake    ASSESSMENT  IMPRESSIONS/SUMMARY    At follow-up, Pt reported low mood in the setting of complications in hospital course/delays in discharge. Stress in the setting of upcoming finals also appear to be contributing to Pt's mood. Overall, her emotional response appears reasonable to her situation. Session topics included self-soothing through cognitive restructuring and supportive self-talk. Pt actively engaged and receptive to intervention.     DIAGNOSTIC IMPRESSIONS    Major depressive disorder, recurrent episode, moderate  Generalized anxiety disorder  Posttraumatic stress disorder    Risk Assessment:  ASQ screening result: no intervention is necessary    -A full risk assessment was previously performed on 11/21.  Risk assessment remains essentially unchanged.    Current suicide risk: low risk  Current homicide risk: low risk      PLAN  RECOMMENDATIONS    ## Safety and Observation Level:   -This patient is not currently under IVC. If safety concerns arise, please page psychiatry for an evaluation. Recommend routine level of observation per primary team.    ## Follow-up:  - The patient desires ongoing follow-up from CL Psychology while medically inpatient.    ## Disposition:  -There are no psychological contraindications to discharging this patient when medically appropriate.   -Pt interested in scheduling outpatient therapy appointment with writer. Appointment coordinated for 12/11 at 11AM.  - When this patient is discharged, please ensure that their AVS includes information about the 15 Suicide & Crisis Lifeline.    ## Behavioral/environmental:   -Please adhere to delirium (prevention) protocol.   -Efforts to encourage normal circadian function are recommended.  Specifically, exposure to natural light during the day, minimization of light at night, consistent/routine times for sleep/wake, and avoiding of daytime naps (if possible) may be helpful.     Olam GORMAN Littles, PsyD  Clinical Psychologist       SUBJECTIVE  SESSION CONTENT     Session began with a brief check-in. Pt reported just learning from her physician that direct transfer to [facility] for double balloon endoscopy may not be possible and that Pt might have to go to the ED or have the procedure as an outpatient. Pt provided space to process emotional reactions, including frustration, sadness, and worry. Pt shared the latter is with respect to upcoming finals and desire to complete her coursework this semester. Pt's experiences were validated and normalized. Pt engaged in discussion of coping strategies via cognitive restructuring  and supportive self-talk. Pt demonstrated good understanding.     OBJECTIVE   MENTAL STATUS     Orientation and consciousness: AOx4   Appearance: Appears stated age  Behavior: Calm, Cooperative, Direct eye contact, and Polite  Speech: Within normal limits  Language: Intact  Mood: Dysphoric  Affect: Full and Mood congruent  Perceptual disturbance (hallucinations, illusions): None  Thought process and association: Linear and coherent  Thought content (delusions, obsessions etc.): None  Insight: Fair  Judgement: Fair  Memory: WNL, although not formally assessed     EDUCATION/INTERVENTIONS:    -Completed mood check-in and reviewed the patient's current stressors and strategies to manage stress.  -Cognitive behavioral therapy  -Discussed the patient's treatment goals, revisited discussion of treatment options, and  explored goals of care.  -Continued to discuss pertinent findings with Pt's care team

## 2024-02-09 LAB — CBC W/ AUTO DIFF
BASOPHILS ABSOLUTE COUNT: 0 10*9/L (ref 0.0–0.1)
BASOPHILS RELATIVE PERCENT: 0.6 %
EOSINOPHILS ABSOLUTE COUNT: 0.2 10*9/L (ref 0.0–0.5)
EOSINOPHILS RELATIVE PERCENT: 2.4 %
HEMATOCRIT: 28.1 % — ABNORMAL LOW (ref 34.0–44.0)
HEMOGLOBIN: 9.4 g/dL — ABNORMAL LOW (ref 11.3–14.9)
LYMPHOCYTES ABSOLUTE COUNT: 1.8 10*9/L (ref 1.1–3.6)
LYMPHOCYTES RELATIVE PERCENT: 24.8 %
MEAN CORPUSCULAR HEMOGLOBIN CONC: 33.6 g/dL (ref 32.0–36.0)
MEAN CORPUSCULAR HEMOGLOBIN: 27.9 pg (ref 25.9–32.4)
MEAN CORPUSCULAR VOLUME: 83.2 fL (ref 77.6–95.7)
MEAN PLATELET VOLUME: 9.1 fL (ref 6.8–10.7)
MONOCYTES ABSOLUTE COUNT: 0.9 10*9/L — ABNORMAL HIGH (ref 0.3–0.8)
MONOCYTES RELATIVE PERCENT: 12.4 %
NEUTROPHILS ABSOLUTE COUNT: 4.4 10*9/L (ref 1.8–7.8)
NEUTROPHILS RELATIVE PERCENT: 59.8 %
PLATELET COUNT: 223 10*9/L (ref 150–450)
RED BLOOD CELL COUNT: 3.38 10*12/L — ABNORMAL LOW (ref 3.95–5.13)
RED CELL DISTRIBUTION WIDTH: 17.2 % — ABNORMAL HIGH (ref 12.2–15.2)
WBC ADJUSTED: 7.3 10*9/L (ref 3.6–11.2)

## 2024-02-09 LAB — COMPREHENSIVE METABOLIC PANEL
ALBUMIN: 2.9 g/dL — ABNORMAL LOW (ref 3.4–5.0)
ALKALINE PHOSPHATASE: 69 U/L (ref 46–116)
ALT (SGPT): 16 U/L (ref 10–49)
ANION GAP: 9 mmol/L (ref 5–14)
AST (SGOT): 17 U/L (ref <=34)
BILIRUBIN TOTAL: <0.2 mg/dL — ABNORMAL LOW (ref 0.3–1.2)
BLOOD UREA NITROGEN: 6 mg/dL — ABNORMAL LOW (ref 9–23)
BUN / CREAT RATIO: 11
CALCIUM: 8.6 mg/dL — ABNORMAL LOW (ref 8.7–10.4)
CHLORIDE: 104 mmol/L (ref 98–107)
CO2: 29 mmol/L (ref 20.0–31.0)
CREATININE: 0.54 mg/dL — ABNORMAL LOW (ref 0.55–1.02)
EGFR CKD-EPI (2021) FEMALE: >90 mL/min/1.73m2 (ref >=60)
GLUCOSE RANDOM: 94 mg/dL (ref 70–179)
POTASSIUM: 3.9 mmol/L (ref 3.4–4.8)
PROTEIN TOTAL: 5.3 g/dL — ABNORMAL LOW (ref 5.7–8.2)
SODIUM: 142 mmol/L (ref 135–145)

## 2024-02-09 MED ADMIN — acetaminophen (TYLENOL) tablet 1,000 mg: 1000 mg | ORAL | @ 23:00:00

## 2024-02-09 MED ADMIN — acetaminophen (TYLENOL) tablet 1,000 mg: 1000 mg | ORAL | @ 16:00:00

## 2024-02-09 MED ADMIN — acetaminophen (TYLENOL) tablet 1,000 mg: 1000 mg | ORAL | @ 08:00:00

## 2024-02-09 MED ADMIN — pantoprazole (Protonix) EC tablet 40 mg: 40 mg | ORAL | @ 22:00:00

## 2024-02-09 MED ADMIN — pantoprazole (Protonix) EC tablet 40 mg: 40 mg | ORAL | @ 13:00:00

## 2024-02-09 MED ADMIN — diphenhydrAMINE (BENADRYL) injection: 25 mg | INTRAVENOUS | @ 22:00:00

## 2024-02-09 MED ADMIN — diphenhydrAMINE (BENADRYL) injection: 25 mg | INTRAVENOUS | @ 11:00:00

## 2024-02-09 MED ADMIN — diphenhydrAMINE (BENADRYL) injection: 25 mg | INTRAVENOUS | @ 04:00:00

## 2024-02-09 MED ADMIN — diphenhydrAMINE (BENADRYL) injection: 25 mg | INTRAVENOUS | @ 16:00:00

## 2024-02-09 MED ADMIN — cetirizine (ZYRTEC) tablet 20 mg: 20 mg | ORAL | @ 13:00:00

## 2024-02-09 MED ADMIN — cetirizine (ZYRTEC) tablet 20 mg: 20 mg | ORAL | @ 01:00:00

## 2024-02-09 MED ADMIN — ondansetron (ZOFRAN) injection 4 mg: 4 mg | INTRAVENOUS | @ 11:00:00

## 2024-02-09 MED ADMIN — ondansetron (ZOFRAN) injection 4 mg: 4 mg | INTRAVENOUS | @ 01:00:00

## 2024-02-09 MED ADMIN — famotidine (PEPCID) tablet 20 mg: 20 mg | ORAL | @ 01:00:00

## 2024-02-09 MED ADMIN — diphenoxylate-atropine (LOMOTIL) 2.5-0.025 mg per tablet 2 tablet: 2 | ORAL | @ 16:00:00

## 2024-02-09 MED ADMIN — diphenoxylate-atropine (LOMOTIL) 2.5-0.025 mg per tablet 2 tablet: 2 | ORAL | @ 11:00:00

## 2024-02-09 MED ADMIN — diphenoxylate-atropine (LOMOTIL) 2.5-0.025 mg per tablet 2 tablet: 2 | ORAL | @ 01:00:00

## 2024-02-09 MED ADMIN — diphenoxylate-atropine (LOMOTIL) 2.5-0.025 mg per tablet 2 tablet: 2 | ORAL | @ 22:00:00

## 2024-02-09 MED ADMIN — hydrocortisone (CORTEF) tablet 10 mg: 10 mg | ORAL | @ 13:00:00

## 2024-02-09 MED ADMIN — baclofen (LIORESAL) tablet 5 mg: 5 mg | ORAL | @ 01:00:00

## 2024-02-09 MED ADMIN — baclofen (LIORESAL) tablet 5 mg: 5 mg | ORAL | @ 13:00:00

## 2024-02-09 MED ADMIN — HYDROmorphone (PF) (DILAUDID) injection 1 mg: 1 mg | INTRAVENOUS | @ 13:00:00 | Stop: 2024-02-10

## 2024-02-09 MED ADMIN — HYDROmorphone (PF) (DILAUDID) injection 1 mg: 1 mg | INTRAVENOUS | @ 17:00:00 | Stop: 2024-02-10

## 2024-02-09 MED ADMIN — HYDROmorphone (PF) (DILAUDID) injection 1 mg: 1 mg | INTRAVENOUS | @ 01:00:00 | Stop: 2024-02-10

## 2024-02-09 MED ADMIN — HYDROmorphone (PF) (DILAUDID) injection 1 mg: 1 mg | INTRAVENOUS | @ 22:00:00 | Stop: 2024-02-10

## 2024-02-09 MED ADMIN — HYDROmorphone (PF) (DILAUDID) injection 1 mg: 1 mg | INTRAVENOUS | @ 06:00:00 | Stop: 2024-02-10

## 2024-02-09 MED ADMIN — escitalopram oxalate (LEXAPRO) tablet 5 mg: 5 mg | ORAL | @ 13:00:00

## 2024-02-09 MED ADMIN — oxyCODONE (ROXICODONE) 5 mg/5 mL solution 20 mg: 20 mg | ORAL | @ 22:00:00 | Stop: 2024-02-13

## 2024-02-09 MED ADMIN — oxyCODONE (ROXICODONE) 5 mg/5 mL solution 20 mg: 20 mg | ORAL | @ 04:00:00 | Stop: 2024-02-13

## 2024-02-09 MED ADMIN — oxyCODONE (ROXICODONE) 5 mg/5 mL solution 20 mg: 20 mg | ORAL | @ 08:00:00 | Stop: 2024-02-13

## 2024-02-09 MED ADMIN — oxyCODONE (ROXICODONE) 5 mg/5 mL solution 20 mg: 20 mg | ORAL | @ 11:00:00 | Stop: 2024-02-13

## 2024-02-09 MED ADMIN — oxyCODONE (ROXICODONE) 5 mg/5 mL solution 20 mg: 20 mg | ORAL | @ 18:00:00 | Stop: 2024-02-13

## 2024-02-09 MED ADMIN — lactated Ringers infusion: 100 mL/h | INTRAVENOUS | @ 16:00:00 | Stop: 2024-02-09

## 2024-02-09 MED ADMIN — scopolamine (TRANSDERM-SCOP) 1 mg over 3 days topical patch 1 mg: 1 | TOPICAL | @ 08:00:00

## 2024-02-09 MED ADMIN — lactated Ringers infusion: 100 mL/h | INTRAVENOUS | @ 22:00:00 | Stop: 2024-02-10

## 2024-02-09 NOTE — Consults (Signed)
 [facility])   Consultation - Liaison (CL) Psychiatry  CL Psychology Follow-Up Note      Service Date:  02/09/24  Admit Date:  01/28/24  Clinician:  Olam Littles, PsyD  Intervention: 45 min Individual Psychotherapy  Face-to-Face Clinical Contact with Patient: 45 min  Pre-/Post-Encounter Activities: 30 min  Method of Interaction: In-Person    BACKGROUND INFORMATION AND REASON FOR REFERRAL:  Please see H&P for full details. 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 She was admitted to the Hospitalist service for management of the concerns above. Pt is well-known to clinical research associate from previous admissions. Please see prior notes for further details on psychosocial history. Psychology consulted to address psychological factors affecting Pt's medical condition.     Interval events:  -No significant adverse behavioral/psychiatric events since last seen by Psychology     ASSESSMENT  IMPRESSIONS/SUMMARY    At follow-up, Pt continued to report low mood and increased stress related to her medical situation. Session topics included emotion-focused coping strategies to enhance psychological flexibility. Of note, Pt reported poor sleep iso frequent GI bleeding and care during the night. She would benefit from care consolidation during the night when feasible to promote rest. Pt actively engaged and receptive to intervention.     DIAGNOSTIC IMPRESSIONS    Major depressive disorder, recurrent episode, moderate  Generalized anxiety disorder  Posttraumatic stress disorder    Risk Assessment:  ASQ screening result: no intervention is necessary    -A full risk assessment was previously performed on 11/21.  Risk assessment remains essentially unchanged.    Current suicide risk: low risk  Current homicide risk: low risk      PLAN  RECOMMENDATIONS    ## Safety and Observation Level:   -This patient is not currently under IVC. If safety concerns arise, please page psychiatry for an evaluation. Recommend routine level of observation per primary team.    ## Follow-up:  - The patient desires ongoing follow-up from CL Psychology while medically inpatient.    ## Disposition:  -There are no psychological contraindications to discharging this patient when medically appropriate.   -Pt interested in scheduling outpatient therapy appointment with writer. Appointment coordinated for 12/11 at 11AM.  - When this patient is discharged, please ensure that their AVS includes information about the 77 Suicide & Crisis Lifeline.    ## Behavioral/environmental:   -Please adhere to delirium (prevention) protocol.   -Efforts to encourage normal circadian function are recommended.  Specifically, exposure to natural light during the day, minimization of light at night, consistent/routine times for sleep/wake, and avoiding of daytime naps (if possible) may be helpful.   -Please consolidate care during the night when feasible.    Olam GORMAN Littles, PsyD  Clinical Psychologist       SUBJECTIVE  SESSION CONTENT     Session began with a brief check-in. Pt reported feeling unwell today in the setting of ongoing frequent GI bleeding, abdominal pain, and poor PO intake. She noted that she was up all night with the above and indicated that sleep was also disrupted due to care during the night. Pt shared that she has her procedure scheduled at [facility] for 12/30 and will have to defer vacation to [place] . Pt provided space to process emotional reactions, including disappointment and anxiety about implications for school. Pt's experiences were validated. We explored  emotion-focused coping approaches to enhance psychological flexibility.    OBJECTIVE   MENTAL STATUS     Orientation and consciousness: AOx4   Appearance: Appears stated age  Behavior: Calm, Cooperative, Direct eye contact, and Polite  Speech: Within normal limits  Language: Intact  Mood: Dysphoric  Affect: Full and Mood congruent  Perceptual disturbance (hallucinations, illusions): None  Thought process and association: Linear and coherent  Thought content (delusions, obsessions etc.): None  Insight: Fair  Judgement: Fair  Memory: WNL, although not formally assessed     EDUCATION/INTERVENTIONS:    -Completed mood check-in and reviewed the patient's current stressors and strategies to manage stress.  -Cognitive behavioral therapy  -Discussed the patient's treatment goals, revisited discussion of treatment options, and  explored goals of care.  -Continued to discuss pertinent findings with Pt's care team

## 2024-02-09 NOTE — Plan of Care (Signed)
 Shift Summary  Severe abdominal pain was managed with multiple PRN doses of HYDROmorphone  and oxyCODONE , resulting in temporary pain relief  before pain recurred.  Diphenhydramine  was given twice for allergy  symptoms, with no further allergic reactions documented after administration.  Lactated Ringers  was administered twice during the shift.  Fall and skin integrity precautions were consistently implemented, including frequent repositioning, absorbent pad changes, and use of a pressure-redistributing mattress.  Throughout the shift, no new hospital-acquired issues, falls, or skin breakdown were documented, and infection prevention measures were maintained.    Absence of Hospital-Acquired Illness or Injury: No new hospital-acquired issues were documented during the shift, with temperature remaining stable and within normal limits and infection prevention measures consistently maintained.    Absence of Allergy  Symptoms: Diphenhydramine  was administered twice for allergy  symptoms, with no further documentation of allergic reactions after administration.    Optimal Pain Control and Function: Abdominal pain fluctuated throughout the shift, with periods of severe pain (8-10/10) that responded to PRN HYDROmorphone  and oxyCODONE , resulting in temporary pain relief  before pain recurred.    Absence of Fall and Fall-Related Injury: Fall prevention strategies were maintained, including side rails, frequent toileting, hourly checks, and environmental modifications, with no falls or injuries reported.    Skin Health and Integrity: Skin remained within defined limits, with frequent repositioning, absorbent pad changes, pressure-redistributing mattress use, and limited adhesive use documented throughout the shift.

## 2024-02-09 NOTE — Plan of Care (Signed)
 A/Ox4 and on room air. Many PRN medications given overnight. Mom at bedside throughout the night. Running LR through port.     Shift Summary  Pain was managed with multiple administrations of HYDROmorphone  and oxyCODONE , resulting in temporary decreases in pain scores.   Fall prevention strategies were maintained throughout the shift, with no reported falls or injuries.   Ondansetron  was given twice for nausea, and no further episodes were documented.   Family presence and environmental modifications contributed to patient safety and comfort.   Overall, pain and nausea were addressed with PRN medications, and safety goals were met during the shift.     Optimal Pain Control and Function: Pain scores fluctuated throughout the shift, with aching pain reported and both HYDROmorphone  and oxyCODONE  administered PRN; pain decreased after interventions but returned to higher levels later in the shift. Patient occasionally declined pain interventions and was able to sleep at times.     Absence of Fall and Fall-Related Injury: Fall reduction program and environmental modifications were consistently maintained, family was present at bedside for much of the shift, and hourly visual checks confirmed patient remained in bed; no falls or injuries occurred.     Nausea and Vomiting Relief: Ondansetron  was administered twice for nausea, with no further documentation of nausea or vomiting during the shift.

## 2024-02-09 NOTE — Progress Notes (Signed)
 Daily Progress Note    Assessment/Plan:    [name] [name] is a 23 y.o. female with [name] and desmoid fibromatosis s/p proctocolectomy with Ileoanal anastomosis that presented to [facility] with GI bleed.    Acute on chronic LGI bleeding - Iron  Deficiency: Increased bleeding and pain for the past ~two weeks in the setting of prior colectomy and ileoanal anastamosis as below. Has had prior ulcers affecting her J pouch and history of prior GIBs. EGD and pouchoscopy 11/21 noted multiple (30-50) gastric polyps, no specimens collected and no evidence of pouchitis.  Biopsy did reveal adenoma (not high grade) that will need resection. She continues to have frequent BMs (~10/day) and Hgb has remained stable in 9s.   -With ongoing bleeding, discussed transfer to [facility] for double balloon endoscopy - [facility] declined on 12/1 and requests outpatient follow up instead  -Continue to monitor for bleeding in house, per out GI team no further endoscopy warranted  -Transfuse for Hgb >7   -IV Zofran  4 mg Q6hr PRN, scopolamine  patch, and prn PO Phenergan   -Trialing Lomitil to help with bowel frequency     Desmoid Fibromatosis s/p Proctocolectomy with Ileoanal anastomosis  - Familial Adenomatous Polyposis - Concern for Breast Lump  Follows with Dr. Loris. Most recently trialed on nirogacestat , though recently, this has been held due to diarrhea and bleeding.  -Oncology aware of admission  -Breast US  obtained on 11/22 and did not show evidence of fluid collections or infection, but a 'full diagnostic evaluation' would need to be considered via Breast Imaging Department as an outpatient  -Obtained US  of abdomen 12/1 to ensure no changes to rectus sheath tumor/bleeding given ongoing pain. This showed no significant change from last CT scan.      Acute on Chronic Pain: Follows with [facility]. Per pain plan:  -Continue home Butrans  patch  -Oxycodone  20mg  Q4hr PRN, IV dilaudid  1 mg Q4hr PRN  -Baclofen  5mg  TID     Mild adrenal insufficiency: Tentative diagnosis during last admission  -Continue Hydrocortisone  10mg  daily, consider stress dosing if hypotensive     GERD:  -Continue home famotidine  20 mg nightly    Malnutrition - Poor PO Intake  -RD consulted  -Supplements   -Gentle fluids for poor intake: resumed LR @ 100 /hr (to expire am of 12/2)     Depression/Anxiety:  -Appreciate Psych recs  -Continue home Lexapro  5 mg daily   -Continue home Ativan  0.5 mg PRN      Disposition: Home once lightheadedness and pain improved   Medically Clear: No    DVT Prophylaxis: encourage ambulation  Diet: Regular        I personally spent greater than 35 minutes face-to-face and non-face-to-face in the care of this patient, which includes all pre, intra, and post visit time on the date of service.  All documented time was specific to the E/M visit and does not include any procedures that may have been performed.  __________________________________________________________________    Subjective:    I first saw patient this morning when she was in middle of conversation with Psychology. I briefly chatted with her - she still feels lightheaded/like she might faint, though she thinks the fluids are helping. She also slept poorly and had to wake up frequently for ongoing bloody BMs and abdominal pain.     I will come back to see her after CL is done, hopefully when her mother is with her as well.     I reached out to RN  to see if we can minimize vitals checks and disturbances during the night.     Reviewed all available labs and imaging from last 24 hrs.      Objective    Temp:  [36.8 ??C (98.2 ??F)-36.9 ??C (98.5 ??F)] 36.8 ??C (98.3 ??F)  Pulse:  [80-108] 81  Resp:  [18] 18  BP: (94-110)/(54-66) 108/64  SpO2:  [95 %-100 %] 98 %    Chronically ill appearing female sitting upright in NAD, tired appearing, appropriate   MMM  Regular rate  Normal WOB on RA  No LE edema

## 2024-02-10 LAB — CBC W/ AUTO DIFF
BASOPHILS ABSOLUTE COUNT: 0 10*9/L (ref 0.0–0.1)
BASOPHILS RELATIVE PERCENT: 0.6 %
EOSINOPHILS ABSOLUTE COUNT: 0.2 10*9/L (ref 0.0–0.5)
EOSINOPHILS RELATIVE PERCENT: 2.9 %
HEMATOCRIT: 27.4 % — ABNORMAL LOW (ref 34.0–44.0)
HEMOGLOBIN: 9 g/dL — ABNORMAL LOW (ref 11.3–14.9)
LYMPHOCYTES ABSOLUTE COUNT: 1.6 10*9/L (ref 1.1–3.6)
LYMPHOCYTES RELATIVE PERCENT: 28.2 %
MEAN CORPUSCULAR HEMOGLOBIN CONC: 32.8 g/dL (ref 32.0–36.0)
MEAN CORPUSCULAR HEMOGLOBIN: 27.6 pg (ref 25.9–32.4)
MEAN CORPUSCULAR VOLUME: 83.9 fL (ref 77.6–95.7)
MEAN PLATELET VOLUME: 9.2 fL (ref 6.8–10.7)
MONOCYTES ABSOLUTE COUNT: 0.8 10*9/L (ref 0.3–0.8)
MONOCYTES RELATIVE PERCENT: 13.7 %
NEUTROPHILS ABSOLUTE COUNT: 3.2 10*9/L (ref 1.8–7.8)
NEUTROPHILS RELATIVE PERCENT: 54.6 %
PLATELET COUNT: 229 10*9/L (ref 150–450)
RED BLOOD CELL COUNT: 3.27 10*12/L — ABNORMAL LOW (ref 3.95–5.13)
RED CELL DISTRIBUTION WIDTH: 17.2 % — ABNORMAL HIGH (ref 12.2–15.2)
WBC ADJUSTED: 5.8 10*9/L (ref 3.6–11.2)

## 2024-02-10 LAB — IRON PANEL
IRON SATURATION: 26 % (ref 20–55)
IRON: 53 ug/dL (ref 50–170)
TOTAL IRON BINDING CAPACITY: 202 ug/dL — ABNORMAL LOW (ref 250–425)

## 2024-02-10 LAB — BASIC METABOLIC PANEL
ANION GAP: 8 mmol/L (ref 5–14)
BLOOD UREA NITROGEN: 7 mg/dL — ABNORMAL LOW (ref 9–23)
BUN / CREAT RATIO: 13
CALCIUM: 8.8 mg/dL (ref 8.7–10.4)
CHLORIDE: 103 mmol/L (ref 98–107)
CO2: 31 mmol/L (ref 20.0–31.0)
CREATININE: 0.55 mg/dL (ref 0.55–1.02)
EGFR CKD-EPI (2021) FEMALE: >90 mL/min/1.73m2 (ref >=60)
GLUCOSE RANDOM: 104 mg/dL (ref 70–179)
POTASSIUM: 4.1 mmol/L (ref 3.5–5.1)
SODIUM: 142 mmol/L (ref 135–145)

## 2024-02-10 LAB — FERRITIN: FERRITIN: 226.7 ng/mL (ref 30.0–270.7)

## 2024-02-10 LAB — PROTIME-INR
INR: 0.98
PROTIME: 11.2 s (ref 9.9–12.6)

## 2024-02-10 MED ADMIN — acetaminophen (TYLENOL) tablet 1,000 mg: 1000 mg | ORAL | @ 08:00:00

## 2024-02-10 MED ADMIN — acetaminophen (TYLENOL) tablet 1,000 mg: 1000 mg | ORAL | @ 14:00:00

## 2024-02-10 MED ADMIN — acetaminophen (TYLENOL) tablet 1,000 mg: 1000 mg | ORAL | @ 23:00:00

## 2024-02-10 MED ADMIN — pantoprazole (Protonix) EC tablet 40 mg: 40 mg | ORAL | @ 22:00:00

## 2024-02-10 MED ADMIN — pantoprazole (Protonix) EC tablet 40 mg: 40 mg | ORAL | @ 14:00:00

## 2024-02-10 MED ADMIN — diphenhydrAMINE (BENADRYL) injection: 25 mg | INTRAVENOUS

## 2024-02-10 MED ADMIN — diphenhydrAMINE (BENADRYL) injection: 25 mg | INTRAVENOUS | @ 12:00:00

## 2024-02-10 MED ADMIN — diphenhydrAMINE (BENADRYL) injection: 25 mg | INTRAVENOUS | @ 05:00:00

## 2024-02-10 MED ADMIN — diphenhydrAMINE (BENADRYL) injection: 25 mg | INTRAVENOUS | @ 18:00:00

## 2024-02-10 MED ADMIN — cetirizine (ZYRTEC) tablet 20 mg: 20 mg | ORAL | @ 14:00:00

## 2024-02-10 MED ADMIN — cetirizine (ZYRTEC) tablet 20 mg: 20 mg | ORAL | @ 02:00:00

## 2024-02-10 MED ADMIN — ondansetron (ZOFRAN) injection 4 mg: 4 mg | INTRAVENOUS | @ 12:00:00

## 2024-02-10 MED ADMIN — ondansetron (ZOFRAN) injection 4 mg: 4 mg | INTRAVENOUS | @ 02:00:00

## 2024-02-10 MED ADMIN — ondansetron (ZOFRAN) injection 4 mg: 4 mg | INTRAVENOUS | @ 18:00:00

## 2024-02-10 MED ADMIN — ondansetron (ZOFRAN) injection 4 mg: 4 mg | INTRAVENOUS

## 2024-02-10 MED ADMIN — famotidine (PEPCID) tablet 20 mg: 20 mg | ORAL | @ 02:00:00

## 2024-02-10 MED ADMIN — diphenoxylate-atropine (LOMOTIL) 2.5-0.025 mg per tablet 2 tablet: 2 | ORAL | @ 18:00:00

## 2024-02-10 MED ADMIN — diphenoxylate-atropine (LOMOTIL) 2.5-0.025 mg per tablet 2 tablet: 2 | ORAL | @ 02:00:00

## 2024-02-10 MED ADMIN — diphenoxylate-atropine (LOMOTIL) 2.5-0.025 mg per tablet 2 tablet: 2 | ORAL | @ 11:00:00

## 2024-02-10 MED ADMIN — diphenoxylate-atropine (LOMOTIL) 2.5-0.025 mg per tablet 2 tablet: 2 | ORAL | @ 22:00:00

## 2024-02-10 MED ADMIN — hydrocortisone (CORTEF) tablet 10 mg: 10 mg | ORAL | @ 14:00:00

## 2024-02-10 MED ADMIN — HYDROmorphone (PF) (DILAUDID) injection 1 mg: 1 mg | INTRAVENOUS | @ 06:00:00 | Stop: 2024-02-15

## 2024-02-10 MED ADMIN — HYDROmorphone (PF) (DILAUDID) injection 1 mg: 1 mg | INTRAVENOUS | @ 16:00:00 | Stop: 2024-02-15

## 2024-02-10 MED ADMIN — HYDROmorphone (PF) (DILAUDID) injection 1 mg: 1 mg | INTRAVENOUS | @ 02:00:00 | Stop: 2024-02-10

## 2024-02-10 MED ADMIN — HYDROmorphone (PF) (DILAUDID) injection 1 mg: 1 mg | INTRAVENOUS | @ 11:00:00 | Stop: 2024-02-15

## 2024-02-10 MED ADMIN — HYDROmorphone (PF) (DILAUDID) injection 1 mg: 1 mg | INTRAVENOUS | @ 21:00:00 | Stop: 2024-02-15

## 2024-02-10 MED ADMIN — escitalopram oxalate (LEXAPRO) tablet 5 mg: 5 mg | ORAL | @ 14:00:00

## 2024-02-10 MED ADMIN — oxyCODONE (ROXICODONE) 5 mg/5 mL solution 20 mg: 20 mg | ORAL | @ 22:00:00 | Stop: 2024-02-14

## 2024-02-10 MED ADMIN — oxyCODONE (ROXICODONE) 5 mg/5 mL solution 20 mg: 20 mg | ORAL | @ 04:00:00 | Stop: 2024-02-13

## 2024-02-10 MED ADMIN — oxyCODONE (ROXICODONE) 5 mg/5 mL solution 20 mg: 20 mg | ORAL | @ 18:00:00 | Stop: 2024-02-14

## 2024-02-10 MED ADMIN — oxyCODONE (ROXICODONE) 5 mg/5 mL solution 20 mg: 20 mg | ORAL | @ 14:00:00 | Stop: 2024-02-14

## 2024-02-10 MED ADMIN — oxyCODONE (ROXICODONE) 5 mg/5 mL solution 20 mg: 20 mg | ORAL | @ 08:00:00 | Stop: 2024-02-14

## 2024-02-10 MED ADMIN — lactated Ringers infusion: 100 mL/h | INTRAVENOUS | @ 03:00:00 | Stop: 2024-02-10

## 2024-02-10 NOTE — Progress Notes (Signed)
 Daily Progress Note    Assessment/Plan:    [name] [name] is a 23 y.o. 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    Acute on chronic LGI bleeding - Iron  Deficiency: Increased bleeding and pain for the past ~two weeks in the setting of prior colectomy and ileoanal anastamosis as below. Has had prior ulcers affecting her J pouch and history of prior GIBs. EGD and pouchoscopy 11/21 noted multiple (30-50) gastric polyps, no specimens collected and no evidence of pouchitis.  Biopsy did reveal adenoma (not high grade) that will need resection. She continues to have frequent BMs (~10/day) and Hgb has remained stable in 9s.   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 Loris. Most recently trialed on nirogacestat , though recently, this has been held due to diarrhea and bleeding.  -Oncology aware of admission  -Breast US  obtained on 11/22 and did not show evidence of fluid collections or infection, but a 'full diagnostic evaluation' would need to be considered via Breast Imaging Department as an outpatient  -Obtained US  of abdomen 12/1 to ensure no changes to rectus sheath tumor/bleeding given ongoing pain. This showed no significant change from last CT scan.      Acute on Chronic Pain: Follows with [facility]. Per pain plan:  -Continue home Butrans  patch  -Oxycodone  20mg  Q4hr PRN, IV dilaudid  1 mg Q4hr PRN  -Baclofen  5mg  TID     Mild adrenal insufficiency: Tentative diagnosis during last admission  -Continue Hydrocortisone  10mg  daily, consider stress dosing if hypotensive     GERD:  -Continue home famotidine  20 mg nightly    Malnutrition - Poor PO Intake  -RD consulted  -Supplements   -Gentle fluids for poor intake: continuous IV fluids have now stopped, encouraging PO hydration      Depression/Anxiety:  -Appreciate Psychology recs  -Continue home Lexapro  5 mg daily   -Continue home Ativan  0.5 mg PRN      Disposition: Home once lightheadedness and pain improved   Medically Clear: No    DVT Prophylaxis: encourage ambulation  Diet: Regular        I personally spent greater than 55 minutes face-to-face and non-face-to-face in the care of this patient, which includes all pre, intra, and post visit time on the date of service.  All documented time was specific to the E/M visit and does not include any procedures that may have been performed.  __________________________________________________________________    Subjective:    She still does not feel well at all - last night was rough with many awakenings  due to ongoing frequent BMs. Also ongoing pain associated with eating with poor PO intake. She does not feel up for going home today as she is worried about lightheadedness/future syncope.     Mother was  at bedside.    Discussed with both [name] [name] and her mother the plan to discuss with GI re: any alternative changes to her regimen for symptom control. Hoping that she will be able to tolerating PO okay, noting that her symptoms will unfortunately continue, with hope to get her to outpatient [facility] appt for double balloon enteroscopy.       Reviewed all available labs and imaging from last 24 hrs.      Objective    Temp:  [36.7 ??C (98.1 ??F)-36.9 ??C (98.4 ??F)] 36.7 ??C (98.1 ??F)  Pulse:  [83-92] 83  Resp:  [18] 18  BP: (103-115)/(52-70) 103/52  SpO2:  [77 %-99 %] 98 %    Fatigued young woman sitting upright in NAD, tired appearing, appropriate   Regular rate, no murmurs  Normal WOB on RA, lungs CTAB  Abdomen with hyperactive bowel sounds, non-peritonitic   No LE edema, warm

## 2024-02-10 NOTE — Plan of Care (Signed)
 Shift Summary  Pt a/ox4 on RA. Independent in ADLs and ambulation.  Prn hydromorphone  and  zofran , given during the shift for pain and nausea management.  Fall prevention and infection control measures were consistently implemented throughout the shift.   Overall, the shift was marked by stable vital signs after initial hypoxemia, ongoing pain management, and adherence to safety protocols.    Absence of Hospital-Acquired Illness or Injury: No new hospital-acquired illnesses or injuries were documented during the shift, and safety interventions such as side rails and bed alarms were consistently in place.    Optimal Comfort and Wellbeing: Pain remained constant and moderate to severe throughout the shift, with HYDROmorphone  administered twice and some fluctuation in pain scores; nausea was treated with ondansetron .    Absence of Fall and Fall-Related Injury: Fall prevention measures, including bed alarms, hourly checks, and scheduled toileting, were maintained and no falls or injuries were reported.    Skin Health and Integrity: Skin was documented as within defined limits and Braden score indicated low risk for breakdown; no changes were noted in skin integrity.    Absence of Infection Signs and Symptoms: Sepsis risk scores remained low throughout the shift and infection prevention protocols were followed, including aseptic technique and environmental hygiene measures.

## 2024-02-10 NOTE — Plan of Care (Signed)
 Shift Summary  HYDROmorphone  was administered twice during the shift for abdominal pain management.   Ondansetron  and diphenhydramine  were given PRN for nausea and allergies, with no further nausea documented after administration.   Baclofen  was refused both times it was offered during the shift.   Skin remained within defined limits and Braden Scale score was high, with no changes in skin integrity noted.   Patient is voiding well and had 5 Bms this shift per patient most of them had blood in them.   Overall, vital signs and sepsis risk scores remained stable and within normal limits throughout the shift.    Skin Health and Integrity: Integumentary assessment was within defined limits and Braden Scale score remained high, indicating no notable changes in skin status throughout the shift.    Nausea and Vomiting Relief: Ondansetron  was administered PRN for nausea, and no further documentation of nausea or vomiting was noted after administration.    Absence of Infection Signs and Symptoms: Temperature remained stable and within normal limits, respiratory rate and SpO2 were unchanged, and sepsis risk scores stayed at zero throughout the shift.

## 2024-02-11 LAB — CBC
HEMATOCRIT: 28.9 % — ABNORMAL LOW (ref 34.0–44.0)
HEMOGLOBIN: 9.5 g/dL — ABNORMAL LOW (ref 11.3–14.9)
MEAN CORPUSCULAR HEMOGLOBIN CONC: 32.9 g/dL (ref 32.0–36.0)
MEAN CORPUSCULAR HEMOGLOBIN: 27.7 pg (ref 25.9–32.4)
MEAN CORPUSCULAR VOLUME: 84.2 fL (ref 77.6–95.7)
MEAN PLATELET VOLUME: 9.4 fL (ref 6.8–10.7)
PLATELET COUNT: 230 10*9/L (ref 150–450)
RED BLOOD CELL COUNT: 3.44 10*12/L — ABNORMAL LOW (ref 3.95–5.13)
RED CELL DISTRIBUTION WIDTH: 17.4 % — ABNORMAL HIGH (ref 12.2–15.2)
WBC ADJUSTED: 6.7 10*9/L (ref 3.6–11.2)

## 2024-02-11 MED ADMIN — acetaminophen (TYLENOL) tablet 1,000 mg: 1000 mg | ORAL | @ 15:00:00

## 2024-02-11 MED ADMIN — acetaminophen (TYLENOL) tablet 1,000 mg: 1000 mg | ORAL | @ 22:00:00

## 2024-02-11 MED ADMIN — acetaminophen (TYLENOL) tablet 1,000 mg: 1000 mg | ORAL | @ 07:00:00

## 2024-02-11 MED ADMIN — pantoprazole (Protonix) EC tablet 40 mg: 40 mg | ORAL | @ 14:00:00

## 2024-02-11 MED ADMIN — pantoprazole (Protonix) EC tablet 40 mg: 40 mg | ORAL | @ 22:00:00

## 2024-02-11 MED ADMIN — diphenhydrAMINE (BENADRYL) injection: 25 mg | INTRAVENOUS | @ 13:00:00

## 2024-02-11 MED ADMIN — diphenhydrAMINE (BENADRYL) injection: 25 mg | INTRAVENOUS | @ 06:00:00

## 2024-02-11 MED ADMIN — diphenhydrAMINE (BENADRYL) injection: 25 mg | INTRAVENOUS | @ 19:00:00

## 2024-02-11 MED ADMIN — cetirizine (ZYRTEC) tablet 20 mg: 20 mg | ORAL | @ 02:00:00

## 2024-02-11 MED ADMIN — ondansetron (ZOFRAN) injection 4 mg: 4 mg | INTRAVENOUS | @ 13:00:00

## 2024-02-11 MED ADMIN — ondansetron (ZOFRAN) injection 4 mg: 4 mg | INTRAVENOUS | @ 06:00:00

## 2024-02-11 MED ADMIN — ondansetron (ZOFRAN) injection 4 mg: 4 mg | INTRAVENOUS | @ 19:00:00

## 2024-02-11 MED ADMIN — famotidine (PEPCID) tablet 20 mg: 20 mg | ORAL | @ 02:00:00

## 2024-02-11 MED ADMIN — diphenoxylate-atropine (LOMOTIL) 2.5-0.025 mg per tablet 2 tablet: 2 | ORAL | @ 18:00:00 | Stop: 2024-02-11

## 2024-02-11 MED ADMIN — diphenoxylate-atropine (LOMOTIL) 2.5-0.025 mg per tablet 2 tablet: 2 | ORAL | @ 02:00:00

## 2024-02-11 MED ADMIN — diphenoxylate-atropine (LOMOTIL) 2.5-0.025 mg per tablet 2 tablet: 2 | ORAL | @ 22:00:00 | Stop: 2024-02-11

## 2024-02-11 MED ADMIN — diphenoxylate-atropine (LOMOTIL) 2.5-0.025 mg per tablet 2 tablet: 2 | ORAL | @ 11:00:00 | Stop: 2024-02-11

## 2024-02-11 MED ADMIN — hydrocortisone (CORTEF) tablet 10 mg: 10 mg | ORAL | @ 14:00:00

## 2024-02-11 MED ADMIN — HYDROmorphone (PF) (DILAUDID) injection 1 mg: 1 mg | INTRAVENOUS | @ 22:00:00 | Stop: 2024-02-15

## 2024-02-11 MED ADMIN — HYDROmorphone (PF) (DILAUDID) injection 1 mg: 1 mg | INTRAVENOUS | @ 04:00:00 | Stop: 2024-02-15

## 2024-02-11 MED ADMIN — HYDROmorphone (PF) (DILAUDID) injection 1 mg: 1 mg | INTRAVENOUS | @ 08:00:00 | Stop: 2024-02-15

## 2024-02-11 MED ADMIN — HYDROmorphone (PF) (DILAUDID) injection 1 mg: 1 mg | INTRAVENOUS | @ 18:00:00 | Stop: 2024-02-15

## 2024-02-11 MED ADMIN — HYDROmorphone (PF) (DILAUDID) injection 1 mg: 1 mg | INTRAVENOUS | @ 14:00:00 | Stop: 2024-02-15

## 2024-02-11 MED ADMIN — escitalopram oxalate (LEXAPRO) tablet 5 mg: 5 mg | ORAL | @ 14:00:00

## 2024-02-11 MED ADMIN — buprenorphine 20 mcg/hour transdermal patch 1 patch: 1 | TRANSDERMAL | @ 07:00:00

## 2024-02-11 MED ADMIN — oxyCODONE (ROXICODONE) 5 mg/5 mL solution 20 mg: 20 mg | ORAL | @ 07:00:00 | Stop: 2024-02-14

## 2024-02-11 MED ADMIN — oxyCODONE (ROXICODONE) 5 mg/5 mL solution 20 mg: 20 mg | ORAL | @ 11:00:00 | Stop: 2024-02-14

## 2024-02-11 MED ADMIN — oxyCODONE (ROXICODONE) 5 mg/5 mL solution 20 mg: 20 mg | ORAL | @ 02:00:00 | Stop: 2024-02-14

## 2024-02-11 MED ADMIN — oxyCODONE (ROXICODONE) 5 mg/5 mL solution 20 mg: 20 mg | ORAL | @ 23:00:00 | Stop: 2024-02-14

## 2024-02-11 MED ADMIN — oxyCODONE (ROXICODONE) 5 mg/5 mL solution 20 mg: 20 mg | ORAL | @ 15:00:00 | Stop: 2024-02-14

## 2024-02-11 MED ADMIN — oxyCODONE (ROXICODONE) 5 mg/5 mL solution 20 mg: 20 mg | ORAL | @ 19:00:00 | Stop: 2024-02-14

## 2024-02-11 MED ADMIN — fluticasone propionate (FLONASE) 50 mcg/actuation nasal spray 2 spray: 2 | TOPICAL | @ 07:00:00

## 2024-02-11 NOTE — Plan of Care (Signed)
 Shift Summary  Pt a/ox4 on RA. Did not fall asleep until around 5am. Prn and scheduled pain meds given for pain mgmt.   Ondansetron  was given for nausea and no further nausea was documented.   Fall prevention measures were consistently maintained with no falls or injuries reported.   Skin integrity was preserved and hygiene was maintained with CHG wipes.   Overall, pain remained a challenge despite interventions, and safety and skin health goals were met during the shift.     Optimal Comfort and Wellbeing: Pain remained constant and aching throughout the shift, with intermittent decreases in pain score after HYDROmorphone  administration, but overall comfort was not significantly improved as pain returned to previous levels; patient declined some interventions for pain and constipation.     Optimal Pain Control and Function: HYDROmorphone  was administered twice during the shift, resulting in temporary reduction of pain scores from 8 to 4, but pain returned to 8 after each intervention; pain was described as both acute and chronic, and patient declined other pain interventions.     Absence of Fall and Fall-Related Injury: Fall reduction strategies were maintained, including environmental modifications and hourly visual checks, with no falls or injuries documented during the shift.     Skin Health and Integrity: Skin remained within defined limits and CHG wipes were used for hygiene; Braden Scale score indicated low risk for skin breakdown.     Nausea and Vomiting Relief: Ondansetron  was administered for nausea, and no further documentation of nausea or vomiting was noted after administration.

## 2024-02-11 NOTE — Progress Notes (Signed)
 Daily Progress Note    Assessment/Plan:    [name] [name] is a 23 y.o. 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    Acute on chronic LGI bleeding - Iron  Deficiency: Increased bleeding and pain for the past ~two weeks in the setting of prior colectomy and ileoanal anastamosis as below. Has had prior ulcers affecting her J pouch and history of prior GIBs. EGD and pouchoscopy 11/21 noted multiple (30-50) gastric polyps, no specimens collected and no evidence of pouchitis.  Biopsy did reveal adenoma (not high grade) that will need resection. She continues to have frequent BMs (>10/day) and Hgb has remained stable in 9s.   -With ongoing bleeding, discussed transfer to [facility] for double balloon endoscopy - [facility] declined on 12/1 and requests outpatient follow up instead  -Continue to monitor for bleeding in house, per our GI team no further endoscopy warranted  -Transfuse for Hgb >7   -IV Zofran  4 mg Q6hr PRN, scopolamine  patch, and prn PO Phenergan   -Trialing Lomitil to help with bowel frequency    - Asked GI 12/4 if any additional recs - could switch to trial of Imodium , but she would prefer to continue Lomotil     - Continue prn Imodium    -Per patient request, obtaining KUB to evaluate intestinal distention. She does have active bowel sounds.    Desmoid Fibromatosis s/p Proctocolectomy with Ileoanal anastomosis  - Familial Adenomatous Polyposis - Concern for Breast Lump  Follows with Dr. Loris. Most recently trialed on nirogacestat , though recently, this has been held due to diarrhea and bleeding.  -Oncology aware of admission  -Breast US  obtained on 11/22 and did not show evidence of fluid collections or infection, but a 'full diagnostic evaluation' would need to be considered via Breast Imaging Department as an outpatient  -Obtained US  of abdomen 12/1 to ensure no changes to rectus sheath tumor/bleeding given ongoing pain. This showed no significant change from last CT scan.      Acute on Chronic Pain: Follows with [facility]. Per pain plan:  -Continue home Butrans  patch  -Oxycodone  20mg  Q4hr PRN, IV Dilaudid  1 mg Q4hr PRN  -Baclofen  5mg  TID     Mild adrenal insufficiency: Tentative diagnosis during last admission  -Continue Hydrocortisone  10mg  daily, consider stress dosing if hypotensive     GERD:  -Continue home famotidine  20 mg nightly    Poor PO Intake  -RD consulted, saw patient again 12/4  -Supplements   -Gentle fluids for poor intake: continuous IV fluids have now stopped, encouraging PO hydration      Depression/Anxiety:  -Appreciate Psychology recs  -Continue home Lexapro  5 mg daily   -Continue home Ativan  0.5 mg PRN      Disposition: Home   Medically Clear: No - awaiting comfort with ambulation/decreased lightheadedness    DVT Prophylaxis: encourage ambulation  Diet: Regular        I personally spent greater than 55 minutes face-to-face and non-face-to-face in the care of this patient, which includes all pre, intra, and post visit time on the date of service.  All documented time was specific to the E/M visit and does not include any procedures that may have been performed.  __________________________________________________________________    Subjective:    Spoke to patient with mother also at bedside. She still feels really bad - notes that she was in bathroom when I came by earlier and felt really lightheaded, so her mother was there  to help her in case she passed out. Thankfully, no syncopal episodes.    She has the sense that something is wrong, noting that she has previously had intussusception and ileus even with recent BMs. She is having ongoing liquid stool.     They spoke to RD today - have questions on what protein supplements to use.    No fevers. Mother is wondering about getting infectious work-up - will see what the KUB shows first.      Reviewed all available labs and imaging from last 24 hrs.      Objective    Temp:  [36.4 ??C (97.5 ??F)-37.1 ??C (98.8 ??F)] 36.9 ??C (98.4 ??F)  Pulse:  [52-94] 94  Resp:  [17-18] 18  BP: (99-105)/(57-67) 102/64  SpO2:  [96 %-100 %] 97 %    Fatigued young woman sitting upright in bed, tired appearing, appropriate   Regular rate  Normal WOB on RA  Abdomen with hyperactive bowel sounds (stable), non-peritonitic   Extremities warm, non-edematous

## 2024-02-11 NOTE — Plan of Care (Signed)
 Shift Summary  Ondansetron  and diphenhydramine  were administered PRN for nausea and allergies, with no vomiting documented.   HYDROmorphone  was administered PRN three times for abdominal pain, with pain scores fluctuating but remaining elevated.   CBC results showed low hemoglobin and hematocrit, consistent with the principal problem, but WBC was within reference range.   Fall prevention strategies were maintained, including scheduled toileting, frequent visual checks, and environmental modifications, with no falls or injuries reported.   Overall, activity, mobility, and skin integrity were preserved, and family support was present throughout the shift.     Absence of Fall and Fall-Related Injury: Fall reduction interventions were consistently maintained throughout the shift, including frequent visual checks, low bed positioning, and scheduled toileting; no falls or injuries occurred. Family presence and environmental modifications were also documented regularly.     Skin Health and Integrity: Braden Scale score remained high and skin assessments were within defined limits, with frequent repositioning and pressure reduction techniques encouraged. No skin breakdown or integrity issues were noted.     Nausea and Vomiting Relief: Ondansetron  was administered twice PRN for nausea, and no documentation of vomiting was present during the shift.     Absence of Infection Signs and Symptoms: Temperature remained stable and within normal limits, and WBC count was within reference range.     Improved Ability to Complete Activities of Daily Living: Mobility and activity levels were documented as normal, and feeding was self-managed throughout the shift.

## 2024-02-12 LAB — CBC W/ AUTO DIFF
BASOPHILS ABSOLUTE COUNT: 0 10*9/L (ref 0.0–0.1)
BASOPHILS RELATIVE PERCENT: 0.5 %
EOSINOPHILS ABSOLUTE COUNT: 0.1 10*9/L (ref 0.0–0.5)
EOSINOPHILS RELATIVE PERCENT: 1.8 %
HEMATOCRIT: 29.9 % — ABNORMAL LOW (ref 34.0–44.0)
HEMOGLOBIN: 10 g/dL — ABNORMAL LOW (ref 11.3–14.9)
LYMPHOCYTES ABSOLUTE COUNT: 1.6 10*9/L (ref 1.1–3.6)
LYMPHOCYTES RELATIVE PERCENT: 22.6 %
MEAN CORPUSCULAR HEMOGLOBIN CONC: 33.7 g/dL (ref 32.0–36.0)
MEAN CORPUSCULAR HEMOGLOBIN: 28 pg (ref 25.9–32.4)
MEAN CORPUSCULAR VOLUME: 83.1 fL (ref 77.6–95.7)
MEAN PLATELET VOLUME: 9.5 fL (ref 6.8–10.7)
MONOCYTES ABSOLUTE COUNT: 0.7 10*9/L (ref 0.3–0.8)
MONOCYTES RELATIVE PERCENT: 10.3 %
NEUTROPHILS ABSOLUTE COUNT: 4.7 10*9/L (ref 1.8–7.8)
NEUTROPHILS RELATIVE PERCENT: 64.8 %
PLATELET COUNT: 221 10*9/L (ref 150–450)
RED BLOOD CELL COUNT: 3.59 10*12/L — ABNORMAL LOW (ref 3.95–5.13)
RED CELL DISTRIBUTION WIDTH: 17.7 % — ABNORMAL HIGH (ref 12.2–15.2)
WBC ADJUSTED: 7.2 10*9/L (ref 3.6–11.2)

## 2024-02-12 MED ADMIN — acetaminophen (TYLENOL) tablet 1,000 mg: 1000 mg | ORAL | @ 16:00:00

## 2024-02-12 MED ADMIN — acetaminophen (TYLENOL) tablet 1,000 mg: 1000 mg | ORAL | @ 22:00:00

## 2024-02-12 MED ADMIN — acetaminophen (TYLENOL) tablet 1,000 mg: 1000 mg | ORAL | @ 08:00:00

## 2024-02-12 MED ADMIN — pantoprazole (Protonix) EC tablet 40 mg: 40 mg | ORAL | @ 22:00:00

## 2024-02-12 MED ADMIN — pantoprazole (Protonix) EC tablet 40 mg: 40 mg | ORAL | @ 14:00:00

## 2024-02-12 MED ADMIN — diphenhydrAMINE (BENADRYL) injection: 25 mg | INTRAVENOUS | @ 08:00:00

## 2024-02-12 MED ADMIN — diphenhydrAMINE (BENADRYL) injection: 25 mg | INTRAVENOUS | @ 13:00:00

## 2024-02-12 MED ADMIN — diphenhydrAMINE (BENADRYL) injection: 25 mg | INTRAVENOUS | @ 20:00:00

## 2024-02-12 MED ADMIN — diphenhydrAMINE (BENADRYL) injection: 25 mg | INTRAVENOUS | @ 01:00:00

## 2024-02-12 MED ADMIN — ondansetron (ZOFRAN) injection 4 mg: 4 mg | INTRAVENOUS | @ 20:00:00

## 2024-02-12 MED ADMIN — ondansetron (ZOFRAN) injection 4 mg: 4 mg | INTRAVENOUS | @ 01:00:00

## 2024-02-12 MED ADMIN — ondansetron (ZOFRAN) injection 4 mg: 4 mg | INTRAVENOUS | @ 13:00:00

## 2024-02-12 MED ADMIN — ondansetron (ZOFRAN) injection 4 mg: 4 mg | INTRAVENOUS | @ 08:00:00

## 2024-02-12 MED ADMIN — famotidine (PEPCID) tablet 20 mg: 20 mg | ORAL | @ 04:00:00

## 2024-02-12 MED ADMIN — hydrocortisone (CORTEF) tablet 10 mg: 10 mg | ORAL | @ 14:00:00

## 2024-02-12 MED ADMIN — diphenoxylate-atropine (LOMOTIL) 2.5-0.025 mg per tablet 1 tablet: 1 | ORAL | @ 04:00:00

## 2024-02-12 MED ADMIN — diphenoxylate-atropine (LOMOTIL) 2.5-0.025 mg per tablet 1 tablet: 1 | ORAL | @ 11:00:00

## 2024-02-12 MED ADMIN — diphenoxylate-atropine (LOMOTIL) 2.5-0.025 mg per tablet 1 tablet: 1 | ORAL | @ 18:00:00

## 2024-02-12 MED ADMIN — diphenoxylate-atropine (LOMOTIL) 2.5-0.025 mg per tablet 1 tablet: 1 | ORAL | @ 22:00:00

## 2024-02-12 MED ADMIN — HYDROmorphone (PF) (DILAUDID) injection 1 mg: 1 mg | INTRAVENOUS | @ 21:00:00 | Stop: 2024-02-15

## 2024-02-12 MED ADMIN — HYDROmorphone (PF) (DILAUDID) injection 1 mg: 1 mg | INTRAVENOUS | @ 16:00:00 | Stop: 2024-02-15

## 2024-02-12 MED ADMIN — HYDROmorphone (PF) (DILAUDID) injection 1 mg: 1 mg | INTRAVENOUS | @ 06:00:00 | Stop: 2024-02-15

## 2024-02-12 MED ADMIN — HYDROmorphone (PF) (DILAUDID) injection 1 mg: 1 mg | INTRAVENOUS | @ 12:00:00 | Stop: 2024-02-15

## 2024-02-12 MED ADMIN — escitalopram oxalate (LEXAPRO) tablet 5 mg: 5 mg | ORAL | @ 14:00:00

## 2024-02-12 MED ADMIN — oxyCODONE (ROXICODONE) 5 mg/5 mL solution 20 mg: 20 mg | ORAL | @ 04:00:00 | Stop: 2024-02-14

## 2024-02-12 MED ADMIN — oxyCODONE (ROXICODONE) 5 mg/5 mL solution 20 mg: 20 mg | ORAL | @ 18:00:00 | Stop: 2024-02-14

## 2024-02-12 MED ADMIN — oxyCODONE (ROXICODONE) 5 mg/5 mL solution 20 mg: 20 mg | ORAL | @ 10:00:00 | Stop: 2024-02-14

## 2024-02-12 MED ADMIN — oxyCODONE (ROXICODONE) 5 mg/5 mL solution 20 mg: 20 mg | ORAL | @ 22:00:00 | Stop: 2024-02-14

## 2024-02-12 MED ADMIN — oxyCODONE (ROXICODONE) 5 mg/5 mL solution 20 mg: 20 mg | ORAL | @ 14:00:00 | Stop: 2024-02-14

## 2024-02-12 MED ADMIN — scopolamine (TRANSDERM-SCOP) 1 mg over 3 days topical patch 1 mg: 1 | TOPICAL | @ 08:00:00

## 2024-02-12 NOTE — Plan of Care (Signed)
 Shift Summary  Pt remains a/ox4 on RA.   HYDROmorphone  was administered for persistent abdominal pain, resulting in a slight decrease in pain score.   Ondansetron  was given twice for nausea, and diphenhydrAMINE  was administered twice for allergy  management.   Fall prevention strategies were maintained throughout the shift, with no reported falls or injuries.   Skin integrity was preserved, with a high Braden Scale score and no new skin issues documented.   Overall, comfort and safety interventions were consistently implemented, and symptoms were managed as needed.     Optimal Comfort and Wellbeing: Abdominal pain remained constant and aching throughout the shift, with a slight decrease in pain score from 8 to 7 after HYDROmorphone  administration; ondansetron  was given twice for nausea, which was managed.     Absence of Allergy  Symptoms: DiphenhydrAMINE  was administered twice for allergies, and no new allergy  symptoms were documented during the shift.     Optimal Pain Control and Function: Pain in the abdomen persisted at a high level but decreased slightly after HYDROmorphone  was given; pain remained constant and ongoing, with no change in clinical progression.     Absence of Fall and Fall-Related Injury: Fall prevention measures were consistently maintained, including frequent toileting, hourly visual checks, and bed safety interventions, with no falls or injuries reported.     Skin Health and Integrity: Braden Scale score remained high at 22, and skin was documented as within defined limits, with no impairment or moisture issues noted.

## 2024-02-12 NOTE — Progress Notes (Signed)
 Daily Progress Note    Assessment/Plan:    [name] [name] is a 23 y.o. 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    Acute on chronic LGI bleeding - Iron  Deficiency: Increased bleeding and pain for the past ~two weeks in the setting of prior colectomy and ileoanal anastamosis as below. Has had prior ulcers affecting her J pouch and history of prior GIBs. EGD and pouchoscopy 11/21 noted multiple (30-50) gastric polyps, no specimens collected and no evidence of pouchitis.  Biopsy did reveal adenoma (not high grade) that will need resection. She continues to have frequent BMs (>10/day) and Hgb has remained stable in 9s - 10. She has reported worsening abdominal pain/sense of distention for last few days.  -With ongoing bleeding, discussed transfer to [facility] for double balloon endoscopy - [facility] declined on 12/1 and requests outpatient follow up instead  -Continue to monitor for bleeding in house, per our GI team no further endoscopy warranted  -Transfuse for Hgb >7   -IV Zofran  4 mg Q6hr PRN, scopolamine  patch, and prn PO Phenergan   -Trialing Lomitil to help with bowel frequency    - Asked GI 12/4 if any additional recs - could switch to trial of Imodium , but she would prefer to continue Lomotil     - Has prn Imodium  ordered  -KUB 12/4 with non-specific bowel dilation in lower abdomen. She has active bowel sounds and ongoing BMs but has had intussusception and ileus in the past. After discussion with patient, opted to decrease Lomotil  dose to 1 tab qid, with plan for repeat KUB today, 12/5.  -S/p IV iron  inpatient. This was complicated by hives needing treatment with steroids and antihistamines. Discussed recommendation to discontinue IV Benadryl , but she has been on this for a long time for sense of throat swelling/face swelling after iron  infusion and has not tolerated PO Benadryl  in the past. Will continue to work on weaning this.    Desmoid Fibromatosis s/p Proctocolectomy with Ileoanal anastomosis  - Familial Adenomatous Polyposis - Concern for Breast Lump  Follows with Dr. Loris. Most recently trialed on nirogacestat , though recently, this has been held due to diarrhea and bleeding.  -Oncology aware of admission  -Breast US  obtained on 11/22 and did not show evidence of fluid collections or infection, but a 'full diagnostic evaluation' would need to be considered via Breast Imaging Department as an outpatient  -Obtained US  of abdomen 12/1 to ensure no changes to rectus sheath tumor/bleeding given ongoing pain. This showed no significant change from last CT scan.      Acute on Chronic Pain: Follows with [facility]. Per pain plan:  -Continue home Butrans  patch  -Oxycodone  20mg  Q4hr PRN, IV Dilaudid  1 mg Q4hr PRN   -Discussed recommendation to spread out Dilaudid  dosing - she feels that she is in worse pain now than she has been though  -Baclofen  5mg  TID     Mild adrenal insufficiency: Tentative diagnosis during last admission  -Continue Hydrocortisone  10mg  daily, consider stress dosing if hypotensive     GERD:  -Continue home famotidine  20 mg nightly    Poor PO Intake  -RD consulted, saw patient again 12/4  -Supplements   -Gentle fluids for poor intake: continuous IV fluids have now stopped, encouraging PO hydration      Depression/Anxiety:  -Appreciate Psychology recs  -Continue home Lexapro  5 mg daily   -Continue home Ativan  0.5 mg PRN  Disposition: Home   DVT Prophylaxis: encourage ambulation  Diet: Regular        I personally spent greater than 55 minutes face-to-face and non-face-to-face in the care of this patient, which includes all pre, intra, and post visit time on the date of service.  All documented time was specific to the E/M visit and does not include any procedures that may have been performed.  __________________________________________________________________    Subjective:    She was able to walk around the unit this am, but she felt very lightheaded and like she might pass out on the last lap. She is glad we decreased the Lomotil  dose and worried about bowel distention, but she did have more loose bowel movements last night and slept very poorly. Feels that something is wrong - wonders if she is dealing with brewing recurrent ileus or intussusception.     Mother was at bedside too, also updated.       Reviewed all available labs and imaging from last 24 hrs.    Objective    Temp:  [36.8 ??C (98.2 ??F)-36.9 ??C (98.4 ??F)] 36.8 ??C (98.2 ??F)  Pulse:  [77-94] 77  Resp:  [18] 18  BP: (96-120)/(61-64) 96/61  SpO2:  [97 %-98 %] 97 %    Young woman sitting upright in bed, tired appearing, responds appropriately   Regular rate  Normal WOB on RA, lung sounds clear  Abdomen with hyperactive bowel sounds (stable), non-peritonitic but with diffuse tenderness especially over R flank/umbilicus   Extremities warm

## 2024-02-12 NOTE — Plan of Care (Signed)
 Shift Summary  Pain management required multiple interventions, with moderate to severe abdominal pain persisting despite HYDROmorphone  administration.   Ondansetron  and benadryl  were given twice to address nausea, supporting comfort.   Fall prevention strategies were maintained throughout the shift, with no falls or injuries documented.   Skin integrity was preserved, and nutritional needs were met with supplements.   No new hospital-acquired illnesses or injuries were documented, and the environment remained safe.     Absence of Hospital-Acquired Illness or Injury: No new hospital-acquired injuries or illnesses documented during the shift, and safety interventions were consistently maintained to support this goal.     Optimal Pain Control and Function: Abdominal pain persisted throughout the shift, with pain scores fluctuating between 7 and 9; HYDROmorphone  was administered twice, but pain remained moderate to severe.     Absence of Fall and Fall-Related Injury: Fall prevention strategies were implemented consistently, including environmental modifications, frequent toileting, and hourly visual checks, with no falls or related injuries reported.     Skin Health and Integrity: Skin was assessed as within defined limits, and Braden Scale score indicated low risk for pressure injury; nutrition was adequate and supplements were provided.     Nausea and Vomiting Relief: Ondansetron  was administered twice during the shift for nausea, supporting symptom relief.

## 2024-02-12 NOTE — Consults (Signed)
 [facility])   Consultation - Liaison (CL) Psychiatry  CL Psychology Follow-Up Note      Service Date:  02/12/24  Admit Date:  01/28/24  Clinician:  Olam Littles, PsyD  Intervention: 30 min Individual Psychotherapy  Face-to-Face Clinical Contact with Patient: 30 min  Pre-/Post-Encounter Activities: 15 min  Method of Interaction: In-Person    BACKGROUND INFORMATION AND REASON FOR REFERRAL:  Please see H&P for full details. 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 She was admitted to the Hospitalist service for management of the concerns above. Pt is well-known to clinical research associate from previous admissions. Please see prior notes for further details on psychosocial history. Psychology consulted to address psychological factors affecting Pt's medical condition.     Interval events:  -No significant adverse behavioral/psychiatric events since last seen by Psychology     ASSESSMENT  IMPRESSIONS/SUMMARY    At follow-up, Pt's mood appeared overall stable She presented with negative self-talk/undue blame related to her medical situation and impact admission has had on her ability to complete her coursework for the semester. Session topics included cultivating self-compassion through perspective taking and gentle self-talk. Pt actively engaged and receptive to intervention.     DIAGNOSTIC IMPRESSIONS    Major depressive disorder, recurrent episode, moderate  Generalized anxiety disorder  Posttraumatic stress disorder    Risk Assessment:  ASQ screening result: no intervention is necessary    -A full risk assessment was previously performed on 11/21.  Risk assessment remains essentially unchanged.    Current suicide risk: low risk  Current homicide risk: low risk      PLAN  RECOMMENDATIONS    ## Safety and Observation Level:   -This patient is not currently under IVC. If safety concerns arise, please page psychiatry for an evaluation. Recommend routine level of observation per primary team.    ## Follow-up:  - The patient desires ongoing follow-up from CL Psychology while medically inpatient.    ## Disposition:  -There are no psychological contraindications to discharging this patient when medically appropriate.   -Pt interested in scheduling outpatient therapy appointment with writer. Appointment coordinated for 12/11 at 11AM.  - When this patient is discharged, please ensure that their AVS includes information about the 18 Suicide & Crisis Lifeline.    ## Behavioral/environmental:   -Please adhere to delirium (prevention) protocol.   -Efforts to encourage normal circadian function are recommended.  Specifically, exposure to natural light during the day, minimization of light at night, consistent/routine times for sleep/wake, and avoiding of daytime naps (if possible) may be helpful.   -Please consolidate care during the night when feasible.    Olam GORMAN Littles, PsyD  Clinical Psychologist       SUBJECTIVE  SESSION CONTENT     Session began with a brief check-in. Pt denied significant changes in mood since last session. She shared that she has been trying to complete as much of her coursework as she can for the semester. Pt provided space to process emotional experiences and disappointment related to her medical situation, including its impact on her education and other plans she had for this month. Pt's experiences were validated and normalized. Pt voiced undue self-blame about her circumstances, and she was engaged in perspective-taking and strengths identification to assist with cultivating self-compassion.     OBJECTIVE   MENTAL STATUS  Orientation and consciousness: AOx4   Appearance: Appears stated age  Behavior: Calm, Cooperative, Direct eye contact, and Polite  Speech: Within normal limits  Language: Intact  Mood: Dysphoric  Affect: Full and Mood congruent  Perceptual disturbance (hallucinations, illusions): None  Thought process and association: Linear and coherent  Thought content (delusions, obsessions etc.): None  Insight: Fair  Judgement: Fair  Memory: WNL, although not formally assessed     EDUCATION/INTERVENTIONS:    -Completed mood check-in and reviewed the patient's current stressors and strategies to manage stress.  -Cognitive behavioral therapy  -Discussed the patient's treatment goals, revisited discussion of treatment options, and  explored goals of care.  -Continued to discuss pertinent findings with Pt's care team

## 2024-02-13 LAB — CBC W/ AUTO DIFF
BASOPHILS ABSOLUTE COUNT: 0 10*9/L (ref 0.0–0.1)
BASOPHILS RELATIVE PERCENT: 0.5 %
EOSINOPHILS ABSOLUTE COUNT: 0.1 10*9/L (ref 0.0–0.5)
EOSINOPHILS RELATIVE PERCENT: 2.2 %
HEMATOCRIT: 29.9 % — ABNORMAL LOW (ref 34.0–44.0)
HEMOGLOBIN: 9.7 g/dL — ABNORMAL LOW (ref 11.3–14.9)
LYMPHOCYTES ABSOLUTE COUNT: 1.5 10*9/L (ref 1.1–3.6)
LYMPHOCYTES RELATIVE PERCENT: 25.5 %
MEAN CORPUSCULAR HEMOGLOBIN CONC: 32.4 g/dL (ref 32.0–36.0)
MEAN CORPUSCULAR HEMOGLOBIN: 27.4 pg (ref 25.9–32.4)
MEAN CORPUSCULAR VOLUME: 84.6 fL (ref 77.6–95.7)
MEAN PLATELET VOLUME: 9.4 fL (ref 6.8–10.7)
MONOCYTES ABSOLUTE COUNT: 0.7 10*9/L (ref 0.3–0.8)
MONOCYTES RELATIVE PERCENT: 11 %
NEUTROPHILS ABSOLUTE COUNT: 3.7 10*9/L (ref 1.8–7.8)
NEUTROPHILS RELATIVE PERCENT: 60.8 %
PLATELET COUNT: 222 10*9/L (ref 150–450)
RED BLOOD CELL COUNT: 3.54 10*12/L — ABNORMAL LOW (ref 3.95–5.13)
RED CELL DISTRIBUTION WIDTH: 18.4 % — ABNORMAL HIGH (ref 12.2–15.2)
WBC ADJUSTED: 6 10*9/L (ref 3.6–11.2)

## 2024-02-13 MED ADMIN — acetaminophen (TYLENOL) tablet 1,000 mg: 1000 mg | ORAL | @ 06:00:00

## 2024-02-13 MED ADMIN — acetaminophen (TYLENOL) tablet 1,000 mg: 1000 mg | ORAL | @ 14:00:00

## 2024-02-13 MED ADMIN — acetaminophen (TYLENOL) tablet 1,000 mg: 1000 mg | ORAL | @ 23:00:00

## 2024-02-13 MED ADMIN — pantoprazole (Protonix) EC tablet 40 mg: 40 mg | ORAL | @ 13:00:00

## 2024-02-13 MED ADMIN — pantoprazole (Protonix) EC tablet 40 mg: 40 mg | ORAL | @ 22:00:00

## 2024-02-13 MED ADMIN — diphenhydrAMINE (BENADRYL) injection: 25 mg | INTRAVENOUS | @ 20:00:00

## 2024-02-13 MED ADMIN — diphenhydrAMINE (BENADRYL) injection: 25 mg | INTRAVENOUS | @ 08:00:00

## 2024-02-13 MED ADMIN — diphenhydrAMINE (BENADRYL) injection: 25 mg | INTRAVENOUS | @ 02:00:00

## 2024-02-13 MED ADMIN — diphenhydrAMINE (BENADRYL) injection: 25 mg | INTRAVENOUS | @ 14:00:00

## 2024-02-13 MED ADMIN — ondansetron (ZOFRAN) injection 4 mg: 4 mg | INTRAVENOUS | @ 20:00:00

## 2024-02-13 MED ADMIN — ondansetron (ZOFRAN) injection 4 mg: 4 mg | INTRAVENOUS | @ 14:00:00

## 2024-02-13 MED ADMIN — ondansetron (ZOFRAN) injection 4 mg: 4 mg | INTRAVENOUS | @ 08:00:00

## 2024-02-13 MED ADMIN — ondansetron (ZOFRAN) injection 4 mg: 4 mg | INTRAVENOUS | @ 02:00:00

## 2024-02-13 MED ADMIN — famotidine (PEPCID) tablet 20 mg: 20 mg | ORAL | @ 02:00:00

## 2024-02-13 MED ADMIN — hydrocortisone (CORTEF) tablet 10 mg: 10 mg | ORAL | @ 13:00:00

## 2024-02-13 MED ADMIN — HYDROmorphone (PF) (DILAUDID) injection 1 mg: 1 mg | INTRAVENOUS | @ 09:00:00 | Stop: 2024-02-15

## 2024-02-13 MED ADMIN — HYDROmorphone (PF) (DILAUDID) injection 1 mg: 1 mg | INTRAVENOUS | @ 22:00:00 | Stop: 2024-02-15

## 2024-02-13 MED ADMIN — HYDROmorphone (PF) (DILAUDID) injection 1 mg: 1 mg | INTRAVENOUS | @ 17:00:00 | Stop: 2024-02-15

## 2024-02-13 MED ADMIN — HYDROmorphone (PF) (DILAUDID) injection 1 mg: 1 mg | INTRAVENOUS | @ 01:00:00 | Stop: 2024-02-15

## 2024-02-13 MED ADMIN — HYDROmorphone (PF) (DILAUDID) injection 1 mg: 1 mg | INTRAVENOUS | @ 13:00:00 | Stop: 2024-02-15

## 2024-02-13 MED ADMIN — HYDROmorphone (PF) (DILAUDID) injection 1 mg: 1 mg | INTRAVENOUS | @ 05:00:00 | Stop: 2024-02-15

## 2024-02-13 MED ADMIN — escitalopram oxalate (LEXAPRO) tablet 5 mg: 5 mg | ORAL | @ 13:00:00

## 2024-02-13 MED ADMIN — oxyCODONE (ROXICODONE) immediate release tablet 20 mg: 20 mg | ORAL | @ 19:00:00 | Stop: 2024-02-13

## 2024-02-13 MED ADMIN — oxyCODONE (ROXICODONE) immediate release tablet 20 mg: 20 mg | ORAL | @ 23:00:00 | Stop: 2024-02-13

## 2024-02-13 MED ADMIN — oxyCODONE (ROXICODONE) immediate release tablet 20 mg: 20 mg | ORAL | @ 10:00:00 | Stop: 2024-02-13

## 2024-02-13 MED ADMIN — oxyCODONE (ROXICODONE) immediate release tablet 20 mg: 20 mg | ORAL | @ 15:00:00 | Stop: 2024-02-13

## 2024-02-13 MED ADMIN — oxyCODONE (ROXICODONE) 5 mg/5 mL solution 20 mg: 20 mg | ORAL | @ 06:00:00 | Stop: 2024-02-13

## 2024-02-13 MED ADMIN — oxyCODONE (ROXICODONE) 5 mg/5 mL solution 20 mg: 20 mg | ORAL | @ 02:00:00 | Stop: 2024-02-14

## 2024-02-13 NOTE — Progress Notes (Signed)
 Daily Progress Note    Assessment/Plan:    [name] [name] is a 23 y.o. 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    Acute on chronic LGI bleeding - Iron  Deficiency: Increased bleeding and pain for the past ~two weeks in the setting of prior colectomy and ileoanal anastamosis as below. Has had prior ulcers affecting her J pouch and history of prior GIBs. EGD and pouchoscopy 11/21 noted multiple (30-50) gastric polyps, no specimens collected and no evidence of pouchitis.  Biopsy did reveal adenoma (not high grade) that will need resection. She continues to have frequent BMs (>10/day) and Hgb has remained stable in 9s - 10. She has reported worsening abdominal pain/sense of distention for last few days.  -With ongoing bleeding, discussed transfer to [facility] for double balloon endoscopy - [facility] declined on 12/1 and requests outpatient follow up instead  -Continue to monitor for bleeding in house, per our GI team no further endoscopy warranted  -Transfuse for Hgb >7   -IV Zofran  4 mg Q6hr PRN, scopolamine  patch, and prn PO Phenergan   -Trialing Lomitil to help with bowel frequency    - Asked GI 12/4 if any additional recs - could switch to trial of Imodium , but she would prefer to continue Lomotil     - Has prn Imodium  ordered  -KUB 12/4 with non-specific bowel dilation in lower abdomen. She has active bowel sounds and ongoing BMs but has had intussusception and ileus in the past. After discussion with patient, opted to decrease Lomotil  dose to 1 tab qid, with plan for repeat KUB today, 12/5.  -S/p IV iron  inpatient. This was complicated by hives needing treatment with steroids and antihistamines. Discussed recommendation to discontinue IV Benadryl , but she has been on this for a long time for sense of throat swelling/face swelling after iron  infusion and has not tolerated PO Benadryl  in the past. Will continue to work on weaning this.    Desmoid Fibromatosis s/p Proctocolectomy with Ileoanal anastomosis  - Familial Adenomatous Polyposis - Concern for Breast Lump  Follows with Dr. Loris. Most recently trialed on nirogacestat , though recently, this has been held due to diarrhea and bleeding.  -Oncology aware of admission  -Breast US  obtained on 11/22 and did not show evidence of fluid collections or infection, but a 'full diagnostic evaluation' would need to be considered via Breast Imaging Department as an outpatient  -Obtained US  of abdomen 12/1 to ensure no changes to rectus sheath tumor/bleeding given ongoing pain. This showed no significant change from last CT scan.      Acute on Chronic Pain: Follows with [facility]. Per pain plan:  -Continue home Butrans  patch  -Oxycodone  20mg  Q4hr PRN, IV Dilaudid  1 mg Q4hr PRN   -Discussed recommendation to spread out Dilaudid  dosing - she feels that she is in worse pain now than she has been though  -Baclofen  5mg  TID     Mild adrenal insufficiency: Tentative diagnosis during last admission  -Continue Hydrocortisone  10mg  daily, consider stress dosing if hypotensive     GERD:  -Continue home famotidine  20 mg nightly    Poor PO Intake  -RD consulted, saw patient again 12/4  -Supplements   -Gentle fluids for poor intake: continuous IV fluids have now stopped, encouraging PO hydration      Depression/Anxiety:  -Appreciate Psychology recs  -Continue home Lexapro  5 mg daily   -Continue home Ativan  0.5 mg PRN  Disposition: Home   DVT Prophylaxis: encourage ambulation  Diet: Regular        I personally spent greater than 55 minutes face-to-face and non-face-to-face in the care of this patient, which includes all pre, intra, and post visit time on the date of service.  All documented time was specific to the E/M visit and does not include any procedures that may have been performed.  __________________________________________________________________    Subjective:    Had a long discussion about the hx of her desmoid tumors, she has had them all over her body and have been increasing, due to continued stomach pain and distention in upper abd which is new went ahead and ordered a CT enterogram to [place] with last May.     Saw the pictures also of her stool which appear to have blood likely sluffing off mucosa in some pictures of how it is in the toiletbowl   Reviewed all available labs and imaging from last 24 hrs.    Objective    Temp:  [36.8 ??C (98.2 ??F)-37 ??C (98.6 ??F)] 37 ??C (98.6 ??F)  Pulse:  [86-92] 92  SpO2 Pulse:  [88-89] 89  Resp:  [18] 18  BP: (106-109)/(58-66) 108/66  SpO2:  [98 %] 98 %    Young woman sitting upright in bed, tired appearing, responds appropriately   Regular rate  Normal WOB on RA, lung sounds clear  Abdomen with hyperactive bowel sounds (stable), non-peritonitic but with diffuse tenderness especially over R flank/umbilicus, lower abd as well as epigastric area   Extremities warm

## 2024-02-13 NOTE — Plan of Care (Signed)
 Shift Summary  Pain management required multiple administrations of HYDROmorphone  and oxyCODONE , but pain scores remained high throughout the shift.   Ondansetron  was given twice for nausea and vomiting relief.   Safety interventions and environmental checks were consistently performed, with no documented hospital-acquired injuries.   Oral care and CHG wipes were provided regularly, and nutrition was adequate.   Family presence was maintained at bedside, supporting comfort and wellbeing.   CBC w/ Differential was resulted, with several low and abnormal values noted.   Overall, pain and comfort needs required ongoing attention, and safety measures were maintained.     Absence of Hospital-Acquired Illness or Injury: Safety interventions such as adjusted lighting, nonskid footwear, and family presence were consistently maintained, and side rails remained up throughout the shift; the environment was documented as safe at all checks. CHG wipes were provided regularly, and oral care was performed independently, supporting hygiene goals.     Optimal Comfort and Wellbeing: Oral care was performed and teeth were brushed, and nutrition was documented as adequate; family was present at bedside throughout the shift, supporting comfort.     Optimal Pain Control and Function: Abdominal pain remained constant and ongoing, fluctuating between 7 and 8 on the pain scale; HYDROmorphone  was administered three times and oxyCODONE  once, but pain relief  was limited as the patient declined other interventions.     Nausea and Vomiting Relief: Ondansetron  was administered twice during the shift for nausea and vomiting relief.

## 2024-02-14 DIAGNOSIS — D48119 Desmoid tumor: Principal | ICD-10-CM

## 2024-02-14 LAB — BASIC METABOLIC PANEL
ANION GAP: 11 mmol/L (ref 5–14)
BLOOD UREA NITROGEN: 9 mg/dL (ref 9–23)
BUN / CREAT RATIO: 17
CALCIUM: 9.1 mg/dL (ref 8.7–10.4)
CHLORIDE: 104 mmol/L (ref 98–107)
CO2: 27 mmol/L (ref 20.0–31.0)
CREATININE: 0.54 mg/dL — ABNORMAL LOW (ref 0.55–1.02)
EGFR CKD-EPI (2021) FEMALE: >90 mL/min/1.73m2 (ref >=60)
GLUCOSE RANDOM: 133 mg/dL (ref 70–179)
POTASSIUM: 3.6 mmol/L (ref 3.4–4.8)
SODIUM: 142 mmol/L (ref 135–145)

## 2024-02-14 LAB — CBC
HEMATOCRIT: 30.6 % — ABNORMAL LOW (ref 34.0–44.0)
HEMOGLOBIN: 10.1 g/dL — ABNORMAL LOW (ref 11.3–14.9)
MEAN CORPUSCULAR HEMOGLOBIN CONC: 33 g/dL (ref 32.0–36.0)
MEAN CORPUSCULAR HEMOGLOBIN: 27.8 pg (ref 25.9–32.4)
MEAN CORPUSCULAR VOLUME: 84.3 fL (ref 77.6–95.7)
MEAN PLATELET VOLUME: 10.1 fL (ref 6.8–10.7)
PLATELET COUNT: 210 10*9/L (ref 150–450)
RED BLOOD CELL COUNT: 3.64 10*12/L — ABNORMAL LOW (ref 3.95–5.13)
RED CELL DISTRIBUTION WIDTH: 17.9 % — ABNORMAL HIGH (ref 12.2–15.2)
WBC ADJUSTED: 8.9 10*9/L (ref 3.6–11.2)

## 2024-02-14 LAB — MAGNESIUM: MAGNESIUM: 1.8 mg/dL (ref 1.6–2.6)

## 2024-02-14 LAB — FERRITIN: FERRITIN: 169.3 ng/mL (ref 30.0–270.7)

## 2024-02-14 LAB — IRON: IRON: 45 ug/dL — ABNORMAL LOW (ref 50–170)

## 2024-02-14 MED ADMIN — acetaminophen (TYLENOL) tablet 1,000 mg: 1000 mg | ORAL | @ 22:00:00

## 2024-02-14 MED ADMIN — acetaminophen (TYLENOL) tablet 1,000 mg: 1000 mg | ORAL | @ 07:00:00

## 2024-02-14 MED ADMIN — acetaminophen (TYLENOL) tablet 1,000 mg: 1000 mg | ORAL | @ 16:00:00

## 2024-02-14 MED ADMIN — pantoprazole (Protonix) EC tablet 40 mg: 40 mg | ORAL | @ 22:00:00

## 2024-02-14 MED ADMIN — pantoprazole (Protonix) EC tablet 40 mg: 40 mg | ORAL | @ 14:00:00

## 2024-02-14 MED ADMIN — diphenhydrAMINE (BENADRYL) injection: 25 mg | INTRAVENOUS | @ 03:00:00

## 2024-02-14 MED ADMIN — diphenhydrAMINE (BENADRYL) injection: 25 mg | INTRAVENOUS | @ 16:00:00

## 2024-02-14 MED ADMIN — diphenhydrAMINE (BENADRYL) injection: 25 mg | INTRAVENOUS | @ 22:00:00

## 2024-02-14 MED ADMIN — diphenhydrAMINE (BENADRYL) injection: 25 mg | INTRAVENOUS | @ 10:00:00

## 2024-02-14 MED ADMIN — ondansetron (ZOFRAN) injection 4 mg: 4 mg | INTRAVENOUS | @ 22:00:00

## 2024-02-14 MED ADMIN — ondansetron (ZOFRAN) injection 4 mg: 4 mg | INTRAVENOUS | @ 03:00:00

## 2024-02-14 MED ADMIN — ondansetron (ZOFRAN) injection 4 mg: 4 mg | INTRAVENOUS | @ 10:00:00

## 2024-02-14 MED ADMIN — ondansetron (ZOFRAN) injection 4 mg: 4 mg | INTRAVENOUS | @ 16:00:00

## 2024-02-14 MED ADMIN — famotidine (PEPCID) tablet 20 mg: 20 mg | ORAL | @ 03:00:00

## 2024-02-14 MED ADMIN — hydrocortisone (CORTEF) tablet 10 mg: 10 mg | ORAL | @ 14:00:00

## 2024-02-14 MED ADMIN — diphenoxylate-atropine (LOMOTIL) 2.5-0.025 mg per tablet 1 tablet: 1 | ORAL | @ 12:00:00

## 2024-02-14 MED ADMIN — diphenoxylate-atropine (LOMOTIL) 2.5-0.025 mg per tablet 1 tablet: 1 | ORAL | @ 03:00:00

## 2024-02-14 MED ADMIN — HYDROmorphone (PF) (DILAUDID) injection 1 mg: 1 mg | INTRAVENOUS | @ 10:00:00 | Stop: 2024-02-15

## 2024-02-14 MED ADMIN — HYDROmorphone (PF) (DILAUDID) injection 1 mg: 1 mg | INTRAVENOUS | @ 14:00:00 | Stop: 2024-02-15

## 2024-02-14 MED ADMIN — HYDROmorphone (PF) (DILAUDID) injection 1 mg: 1 mg | INTRAVENOUS | @ 23:00:00 | Stop: 2024-02-15

## 2024-02-14 MED ADMIN — HYDROmorphone (PF) (DILAUDID) injection 1 mg: 1 mg | INTRAVENOUS | @ 18:00:00 | Stop: 2024-02-15

## 2024-02-14 MED ADMIN — HYDROmorphone (PF) (DILAUDID) injection 1 mg: 1 mg | INTRAVENOUS | @ 02:00:00 | Stop: 2024-02-15

## 2024-02-14 MED ADMIN — escitalopram oxalate (LEXAPRO) tablet 5 mg: 5 mg | ORAL | @ 14:00:00

## 2024-02-14 MED ADMIN — loperamide (IMODIUM) capsule 2 mg: 2 mg | ORAL | @ 19:00:00

## 2024-02-14 MED ADMIN — oxyCODONE (ROXICODONE) immediate release tablet 20 mg: 20 mg | ORAL | @ 03:00:00 | Stop: 2024-02-13

## 2024-02-14 MED ADMIN — iohexol (OMNIPAQUE) 350 mg iodine/mL solution 100 mL: 100 mL | INTRAVENOUS | @ 01:00:00 | Stop: 2024-02-13

## 2024-02-14 MED ADMIN — oxyCODONE (ROXICODONE) immediate release tablet 20 mg: 20 mg | ORAL | @ 16:00:00 | Stop: 2024-02-14

## 2024-02-14 MED ADMIN — oxyCODONE (ROXICODONE) immediate release tablet 20 mg: 20 mg | ORAL | @ 20:00:00 | Stop: 2024-02-14

## 2024-02-14 MED ADMIN — oxyCODONE (ROXICODONE) immediate release tablet 20 mg: 20 mg | ORAL | @ 07:00:00 | Stop: 2024-02-14

## 2024-02-14 MED ADMIN — oxyCODONE (ROXICODONE) immediate release tablet 20 mg: 20 mg | ORAL | Stop: 2024-02-14

## 2024-02-14 MED ADMIN — oxyCODONE (ROXICODONE) immediate release tablet 20 mg: 20 mg | ORAL | @ 12:00:00 | Stop: 2024-02-14

## 2024-02-14 MED ADMIN — bismuth subsalicylate (PEPTO-BISMOL) oral suspension: 60 mL | ORAL | @ 19:00:00 | Stop: 2024-02-14

## 2024-02-14 NOTE — Plan of Care (Signed)
 Patient admitted with GIB. No bloody stools per patient report. Vitals signs stable. Afebrile. O2 sats stable on room air. Patient has been safe and free of injury this shift. Call bell and personal items within patient reach. Continue to monitor.     Shift Summary  Abdominal pain was managed with HYDROmorphone , resulting in decreased pain scores and improved comfort.  Diphenhydramine  was given PRN for allergies, with no documented allergic reactions.  Pediasure supplement and oral intake supported adequate nutrition, and patient was able to feed self.  Fall prevention strategies were maintained, and patient remained independent with mobility and positioning.  Overall, patient demonstrated stable skin integrity, appropriate mood and cognition, and no hospital-acquired complications during the shift.    Absence of Hospital-Acquired Illness or Injury: Skin remained intact and appropriate for ethnicity, with no changes in skin turgor or integrity noted throughout the shift; Braden score was 21, and patient was able to turn self in bed independently.    Absence of Allergy  Symptoms: Diphenhydramine  was administered PRN for allergies, and no allergy  symptoms were documented during the shift.    Optimal Pain Control and Function: Abdominal pain decreased from 8 to 5 after HYDROmorphone  administration, and patient remained calm and cooperative with improved comfort.    Absence of Fall and Fall-Related Injury: Fall reduction interventions were maintained, patient was independent with mobility, and no falls or injuries occurred during the shift.    Improved Nutritional Intake: Patient was able to feed self, received Pediasure supplement, and oral intake was 120 mL; nutrition was assessed as adequate.      Problem: Adult Inpatient Plan of Care  Goal: Absence of Hospital-Acquired Illness or Injury  Outcome: Shift Focus  Intervention: Identify and Manage Fall Risk  Recent Flowsheet Documentation  Taken 02/13/2024 2213 by Alvia Cough, RN  Safety Interventions:   environmental modification   family at bedside   lighting adjusted for tasks/safety   low bed   fall reduction program maintained   nonskid shoes/slippers when out of bed  Intervention: Prevent Skin Injury  Recent Flowsheet Documentation  Taken 02/13/2024 2213 by Alvia Cough, RN  Positioning for Skin: Supine/Back  Intervention: Prevent and Manage VTE (Venous Thromboembolism) Risk  Recent Flowsheet Documentation  Taken 02/14/2024 0000 by Alvia Cough, RN  Anti-Embolism Device Status: (VTE: Low risk, patient is ambulatory) Other (Comment)  Taken 02/13/2024 2213 by Alvia Cough, RN  Anti-Embolism Device Status: (VTE: Low risk, patient is ambulatory) Other (Comment)  Taken 02/13/2024 2020 by Alvia Cough, RN  Anti-Embolism Device Status: (VTE: Low risk, patient is ambulatory) Other (Comment)     Problem: Latex Allergy   Goal: Absence of Allergy  Symptoms  Outcome: Shift Focus     Problem: Pain Acute  Goal: Optimal Pain Control and Function  Outcome: Shift Focus     Problem: Fall Injury Risk  Goal: Absence of Fall and Fall-Related Injury  Outcome: Shift Focus  Intervention: Promote Injury-Free Environment  Recent Flowsheet Documentation  Taken 02/13/2024 2213 by Alvia Cough, RN  Safety Interventions:   environmental modification   family at bedside   lighting adjusted for tasks/safety   low bed   fall reduction program maintained   nonskid shoes/slippers when out of bed     Problem: Skin Injury Risk Increased  Goal: Skin Health and Integrity  Intervention: Optimize Skin Protection  Recent Flowsheet Documentation  Taken 02/13/2024 2213 by Alvia Cough, RN  Activity Management: in bed  Taken 02/13/2024 2020 by Alvia Cough, RN  Activity Management: ambulated to bathroom  Problem: Malnutrition  Goal: Improved Nutritional Intake  Outcome: Shift Focus

## 2024-02-14 NOTE — Plan of Care (Signed)
 Shift Summary  Pain management required multiple interventions, with HYDROmorphone  given twice and pain scores fluctuating throughout the shift.    Ondansetron  was administered twice for nausea and vomiting, with improvement noted after each dose.    Diphenhydramine  was given twice for allergy  symptoms, which improved following administration.    Fall prevention measures were maintained consistently, including environmental modifications and scheduled toileting, with no reported falls.    Overall, symptoms were managed with PRN medications and safety interventions, and no new hospital-acquired issues were documented.     Absence of Hospital-Acquired Illness or Injury: No new hospital-acquired injuries or illnesses documented during the shift; skin remained within defined limits and Braden Scale score was stable at 21.     Absence of Allergy  Symptoms: Diphenhydramine  was administered twice for allergies, and allergy  symptoms improved after each administration.     Optimal Pain Control and Function: Abdominal pain fluctuated between moderate and severe, with pain scores alternating between 3 and 8; HYDROmorphone  was given twice, and pain decreased after administration, though patient declined other interventions.     Absence of Fall and Fall-Related Injury: Fall prevention strategies were consistently implemented, including environmental modifications, side rails, and scheduled toileting, with no falls or injuries reported.     Nausea and Vomiting Relief: Ondansetron  was administered twice for nausea and vomiting, with documented improvement following each dose.

## 2024-02-14 NOTE — Progress Notes (Signed)
 Daily Progress Note    Assessment/Plan:    [name] [name] is a 23 y.o. female with [name] and desmoid fibromatosis s/p proctocolectomy with ileoanal anastomosis that presented to [facility] with GI bleed.    Acute on chronic LGI bleeding - Iron  Deficiency: Increased bleeding and pain for the past ~two weeks in the setting of prior colectomy and ileoanal anastamosis as below. Has had prior ulcers affecting her J pouch and history of prior GIBs. EGD and pouchoscopy 11/21 noted multiple (30-50) gastric polyps, no specimens collected and no evidence of pouchitis.  Biopsy did reveal adenoma (not high grade) that will need resection. She continues to have frequent BMs (>10/day) and Hgb has remained stable in 9s - 10. She has reported worsening abdominal pain/sense of distention for last few days.  -With ongoing bleeding, discussed transfer to [facility] for double balloon endoscopy - [facility] declined on 12/1 and requests outpatient follow up instead  -Continue to monitor for bleeding in house, per our GI team no further endoscopy warranted  -Transfuse for Hgb >7   -IV Zofran  4 mg Q6hr PRN, scopolamine  patch, and prn PO Phenergan   -Trialing Lomitil to help with bowel frequency    - Asked GI 12/4 if any additional recs - could switch to trial of Imodium , but she would prefer to continue Lomotil     - Has prn Imodium  ordered  -CT enteroscopy 12/6   Since 01/27/2024:      -Grossly similar size of the ill-defined central mesenteric mass and right inferior rectus intramuscular masses.        -New mild asymmetric thickening of the small bowel leading into the ileoanal anastomosis which may be exaggerated due to under distention. There are a few additional mildly thickened small bowel loops in the lower abdomen and pelvis which could be seen with enteritis. No evidence of bowel obstruction.      -Additional chronic and incidental findings as described in the body of the report.     -S/p IV iron  inpatient. This was complicated by hives needing treatment with steroids and antihistamines. Discussed recommendation to discontinue IV Benadryl , but she has been on this for a long time for sense of throat swelling/face swelling after iron  infusion and has not tolerated PO Benadryl  in the past. Will continue to work on weaning this.  - checking iron  down to 45    Desmoid Fibromatosis s/p Proctocolectomy with Ileoanal anastomosis  - Familial Adenomatous Polyposis - Concern for Breast Lump  Follows with Dr. Loris. Most recently trialed on nirogacestat , though recently, this has been held due to diarrhea and bleeding.  -Oncology aware of admission  -Breast US  obtained on 11/22 and did not show evidence of fluid collections or infection, but a 'full diagnostic evaluation' would need to be considered via Breast Imaging Department as an outpatient  -Obtained US  of abdomen 12/1 to ensure no changes to rectus sheath tumor/bleeding given ongoing pain. This showed no significant change from last CT scan.      Acute on Chronic Pain: Follows with [facility]. Per pain plan:  -Continue home Butrans  patch  -Oxycodone  20mg  Q4hr PRN, IV Dilaudid  1 mg Q4hr PRN   -Discussed recommendation to spread out Dilaudid  dosing - she feels that she is in worse pain now than she has been though  -Baclofen  5mg  TID     Mild adrenal insufficiency: Tentative diagnosis during last admission  -Continue Hydrocortisone  10mg  daily, consider stress dosing if hypotensive     GERD:  -Continue home  famotidine  20 mg nightly    Poor PO Intake  -RD consulted, saw patient again 12/4  -Supplements   -Gentle fluids for poor intake: continuous IV fluids have now stopped, encouraging PO hydration      Depression/Anxiety:  -Appreciate Psychology recs  -Continue home Lexapro  5 mg daily   -Continue home Ativan  0.5 mg PRN      Disposition: Home   DVT Prophylaxis: encourage ambulation  Diet: Regular        I personally spent greater than 55 minutes face-to-face and non-face-to-face in the care of this patient, which includes all pre, intra, and post visit time on the date of service.  All documented time was specific to the E/M visit and does not include any procedures that may have been performed.  __________________________________________________________________    Subjective:    She was having sevre pain and burring from the contrast from yesterday going to the bathroom over and over. Trying to slow it down with peptobismal. See if that coats the mucosa and helps. Next option would be lidocaine .     Objective    Temp:  [36 ??C (96.8 ??F)-37 ??C (98.6 ??F)] 37 ??C (98.6 ??F)  Pulse:  [87-91] 90  SpO2 Pulse:  [95] 95  Resp:  [18] 18  BP: (96-114)/(61-69) 114/68  SpO2:  [96 %-99 %] 98 %    Young woman sitting upright in bed  Regular rate  Normal WOB on RA, lung sounds clear  Abdomen with hyperactive bowel sounds (stable), non-peritonitic but with diffuse tenderness especially over R flankepigastric  lower abd, noticible spasm of abd hyperactive bowel   Extremities warm

## 2024-02-15 LAB — IRON PANEL
IRON SATURATION: 19 % — ABNORMAL LOW (ref 20–55)
IRON: 44 ug/dL — ABNORMAL LOW (ref 50–170)
TOTAL IRON BINDING CAPACITY: 236 ug/dL — ABNORMAL LOW (ref 250–425)

## 2024-02-15 LAB — C-REACTIVE PROTEIN: C-REACTIVE PROTEIN: <5 mg/L (ref <=10.0)

## 2024-02-15 MED ADMIN — acetaminophen (TYLENOL) tablet 1,000 mg: 1000 mg | ORAL | @ 15:00:00

## 2024-02-15 MED ADMIN — acetaminophen (TYLENOL) tablet 1,000 mg: 1000 mg | ORAL | @ 23:00:00

## 2024-02-15 MED ADMIN — acetaminophen (TYLENOL) tablet 1,000 mg: 1000 mg | ORAL | @ 06:00:00

## 2024-02-15 MED ADMIN — pantoprazole (Protonix) EC tablet 40 mg: 40 mg | ORAL | @ 23:00:00

## 2024-02-15 MED ADMIN — pantoprazole (Protonix) EC tablet 40 mg: 40 mg | ORAL | @ 14:00:00

## 2024-02-15 MED ADMIN — diphenhydrAMINE (BENADRYL) injection: 25 mg | INTRAVENOUS | @ 11:00:00

## 2024-02-15 MED ADMIN — diphenhydrAMINE (BENADRYL) injection: 25 mg | INTRAVENOUS | @ 18:00:00

## 2024-02-15 MED ADMIN — diphenhydrAMINE (BENADRYL) injection: 25 mg | INTRAVENOUS | @ 04:00:00

## 2024-02-15 MED ADMIN — ondansetron (ZOFRAN) injection 4 mg: 4 mg | INTRAVENOUS | @ 18:00:00

## 2024-02-15 MED ADMIN — ondansetron (ZOFRAN) injection 4 mg: 4 mg | INTRAVENOUS | @ 04:00:00

## 2024-02-15 MED ADMIN — ondansetron (ZOFRAN) injection 4 mg: 4 mg | INTRAVENOUS | @ 11:00:00

## 2024-02-15 MED ADMIN — famotidine (PEPCID) tablet 20 mg: 20 mg | ORAL | @ 01:00:00

## 2024-02-15 MED ADMIN — hydrocortisone (CORTEF) tablet 10 mg: 10 mg | ORAL | @ 14:00:00

## 2024-02-15 MED ADMIN — oxyCODONE (ROXICODONE) immediate release tablet 20 mg: 20 mg | ORAL | @ 08:00:00 | Stop: 2024-02-20

## 2024-02-15 MED ADMIN — oxyCODONE (ROXICODONE) immediate release tablet 20 mg: 20 mg | ORAL | @ 16:00:00 | Stop: 2024-02-20

## 2024-02-15 MED ADMIN — oxyCODONE (ROXICODONE) immediate release tablet 20 mg: 20 mg | ORAL | @ 20:00:00 | Stop: 2024-02-20

## 2024-02-15 MED ADMIN — oxyCODONE (ROXICODONE) immediate release tablet 20 mg: 20 mg | ORAL | @ 12:00:00 | Stop: 2024-02-20

## 2024-02-15 MED ADMIN — HYDROmorphone (PF) (DILAUDID) injection 1 mg: 1 mg | INTRAVENOUS | @ 07:00:00 | Stop: 2024-02-15

## 2024-02-15 MED ADMIN — HYDROmorphone (PF) (DILAUDID) injection 1 mg: 1 mg | INTRAVENOUS | @ 11:00:00 | Stop: 2024-02-15

## 2024-02-15 MED ADMIN — HYDROmorphone (PF) (DILAUDID) injection 1 mg: 1 mg | INTRAVENOUS | @ 22:00:00 | Stop: 2024-02-15

## 2024-02-15 MED ADMIN — HYDROmorphone (PF) (DILAUDID) injection 1 mg: 1 mg | INTRAVENOUS | @ 03:00:00 | Stop: 2024-02-15

## 2024-02-15 MED ADMIN — HYDROmorphone (PF) (DILAUDID) injection 1 mg: 1 mg | INTRAVENOUS | @ 15:00:00 | Stop: 2024-02-15

## 2024-02-15 MED ADMIN — escitalopram oxalate (LEXAPRO) tablet 5 mg: 5 mg | ORAL | @ 14:00:00

## 2024-02-15 MED ADMIN — scopolamine (TRANSDERM-SCOP) 1 mg over 3 days topical patch 1 mg: 1 | TOPICAL | @ 06:00:00

## 2024-02-15 MED ADMIN — bismuth subsalicylate (PEPTO-BISMOL) oral suspension: 30 mL | ORAL | @ 06:00:00

## 2024-02-15 MED ADMIN — lactated ringers bolus 1,000 mL: 1000 mL | INTRAVENOUS | @ 23:00:00 | Stop: 2024-02-15

## 2024-02-15 NOTE — Consults (Signed)
 [facility])   Consultation - Liaison (CL) Psychiatry  CL Psychology Follow-Up Note      Service Date:  02/15/24  Admit Date:  01/28/24  Clinician:  Olam Littles, PsyD  Intervention: 60 min Individual Psychotherapy  Face-to-Face Clinical Contact with Patient: 65 min  Pre-/Post-Encounter Activities: 15 min  Method of Interaction: In-Person    BACKGROUND INFORMATION AND REASON FOR REFERRAL:  Please see H&P for full details. 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 She was admitted to the Hospitalist service for management of the concerns above. Pt is well-known to clinical research associate from previous admissions. Please see prior notes for further details on psychosocial history. Psychology consulted to address psychological factors affecting Pt's medical condition.     Interval events:  -No significant adverse behavioral/psychiatric events since last seen by Psychology     ASSESSMENT  IMPRESSIONS/SUMMARY    At follow-up, Pt endorsed ongoing anxiety in relation to the interplay between her medical situation and education, particularly in the setting of several of her finals taking place over the upcoming weeks. Session topics included cognitive restructuring to reduce anxiety and orient Pt to her strengths. Pt actively engaged and receptive to intervention.     DIAGNOSTIC IMPRESSIONS    Major depressive disorder, recurrent episode, moderate  Generalized anxiety disorder  Posttraumatic stress disorder    Risk Assessment:  ASQ screening result: no intervention is necessary    -A full risk assessment was previously performed on 11/21.  Risk assessment remains essentially unchanged.    Current suicide risk: low risk  Current homicide risk: low risk      PLAN  RECOMMENDATIONS    ## Safety and Observation Level:   -This patient is not currently under IVC. If safety concerns arise, please page psychiatry for an evaluation. Recommend routine level of observation per primary team.    ## Follow-up:  - The patient desires ongoing follow-up from CL Psychology while medically inpatient.    ## Disposition:  -There are no psychological contraindications to discharging this patient when medically appropriate.   -Pt interested in scheduling outpatient therapy appointment with writer. Appointment coordinated for 12/11 at 11AM.  - When this patient is discharged, please ensure that their AVS includes information about the 84 Suicide & Crisis Lifeline.    ## Behavioral/environmental:   -Please adhere to delirium (prevention) protocol.   -Efforts to encourage normal circadian function are recommended.  Specifically, exposure to natural light during the day, minimization of light at night, consistent/routine times for sleep/wake, and avoiding of daytime naps (if possible) may be helpful.   -Please consolidate care during the night when feasible.    Olam GORMAN Littles, PsyD  Clinical Psychologist       SUBJECTIVE  SESSION CONTENT     Session began with a brief check-in. Pt recounted salient events that have occurred since last week. Pt endorsed ongoing anxiety about her medical situation, namely continued hospitalization during finals. Pt noted that most of her professors have been supportive and working to provide the appropriate accommodations, however, she has one professor that has been less accommodating. Cognitive restructuring strategies were used to help Pt get a healthy perspective, and she was oriented to her strengths. Pt encouraged to continue to use self-compassion and adaptive self-talk to reduce undue blame, and discussed the importance of prioritizing health first.  OBJECTIVE   MENTAL STATUS     Orientation and consciousness: AOx4   Appearance: Appears stated age  Behavior: Calm, Cooperative, Direct eye contact, and Polite  Speech: Within normal limits  Language: Intact  Mood: Anxious  Affect: Full and Mood congruent  Perceptual disturbance (hallucinations, illusions): None  Thought process and association: Linear and coherent  Thought content (delusions, obsessions etc.): None  Insight: Fair  Judgement: Fair  Memory: WNL, although not formally assessed     EDUCATION/INTERVENTIONS:    -Completed mood check-in and reviewed the patient's current stressors and strategies to manage stress.  -Cognitive behavioral therapy  -Discussed the patient's treatment goals, revisited discussion of treatment options, and  explored goals of care.  -Continued to discuss pertinent findings with Pt's care team

## 2024-02-15 NOTE — Plan of Care (Signed)
 Shift Summary  Pain was managed with HYDROmorphone  and oxyCODONE , resulting in intermittent relief and periods of sleep.    Nausea was addressed with ondansetron  and bismuth  subsalicylate, with no vomiting documented.    Fall prevention strategies were maintained, including bed alarm activation and regular toileting.    Family remained present and updated, and patient continued to feed self and use wheelchair for transport.    Overall, patient maintained stable vital signs and hygiene, with no documentation of infection or bowel incontinence.     Readiness for Transition of Care: Able to feed self and transport by wheelchair throughout the shift; family present and updated, and unplanned readmission score remained stable.     Optimal Pain Control and Function: Abdominal pain fluctuated between high and low, with intermittent relief after HYDROmorphone  and oxyCODONE  administration; pain was described as aching and discomfort, and sleep was achieved during periods of low pain.     Absence of Fall and Fall-Related Injury: Fall reduction program and environmental modifications were consistently maintained, hourly visual checks and toileting every 2 hours were performed, and bed alarm was activated during the night.     Nausea and Vomiting Relief: Ondansetron  and bismuth  subsalicylate were administered PRN, and no documentation of vomiting was noted during the shift.     Absence of Infection Signs and Symptoms: Temperature remained within normal range and no bowel incontinence was documented; CHG wipes were used for hygiene.

## 2024-02-15 NOTE — Treatment Plan (Signed)
 Hematology Treatment Plan    Assessment:    38F PMH notable for with a PMHx of [name] ([name]) s/p proctocolectomy w/ileoanal anastomosis in 2022, desmoid tumors (currently on nirogacestat  since 09/2022, previously on sorafenib ), chronic anemia, previously on TPN and prolonged tube feeds, chronic abdominal pain, intractable migraine, syncope, health care associated PTSD who presented to [facility] with recurrent abdominal pain, pre-syncope, hematochezia.     She has a history of iron -deficiency anemia in the setting of GI bleeding (pending double-bubble enteroscopy at end of month), uterine bleeding, possible impaired absorption due to abdominal disease.    She tolerated IV Ferrlecit  initially but third dose caused severe side effects including extremity swelling, difficulty breathing, and urticaria, for which she proceeded to the ED and was given steroids and benadryl . A dose of Ferrlecit  with [facility] protocol was attempted on 04/08/22 and she unfortunately developed itching and redness. Venofer was then attempted and she developed a reaction with the second dose. She was admitted for other reasons in March 2024 and was able to receive 1g Infed  though she required steroids, benadryl , and pepcid  afterward. She last received FeraHeme  in August 2024 during a hospitalization and again had symptoms of rash/itching but was able to complete infusion.     She received IV iron  dextran 1g 12/04/2023 and 01/30/24.    Labs suggestive of mild normocytic anemia with slightly reduced iron  stores (ferritin may be elevated as acute phase reactant)             12/3 -> 12/7  ferritin 226      169   Iron      53         44  %sat   26%      19%    Recommendations:    If anticipated to remain inpatient for a few more days, can non-urgently give additional 1gm IV iron  per usual regimen with premedications:  - cetirizine  20 mg twice daily for 48 hours prior to infusion and at least 72 hours after;   - prednisone  50 mg x 2 doses at 13 and 7 hours prior to infusion   then methylprednisolone  40 mg IV immediately prior to infusion;   - diphenhydramine  50 mg IV, famotidine  40 mg IV 30 minutes prior to infusion.   --> Then give iron  dextran 1 g    Lux Meaders Al-Obeidi MD  Hematology/Oncology fellow

## 2024-02-15 NOTE — Progress Notes (Signed)
 Daily Progress Note    Assessment/Plan:    [name] [name] is a 23 y.o. female with [name] and desmoid fibromatosis s/p proctocolectomy with ileoanal anastomosis that presented to [facility] with GI bleed.    Acute on chronic LGI bleeding - Iron  Deficiency: Increased bleeding and pain for the past ~two weeks in the setting of prior colectomy and ileoanal anastamosis as below. Has had prior ulcers affecting her J pouch and history of prior GIBs. EGD and pouchoscopy 11/21 noted multiple (30-50) gastric polyps, no specimens collected and no evidence of pouchitis.  Biopsy did reveal adenoma (not high grade) that will need resection. She continues to have frequent BMs (>10/day) and Hgb has remained stable in 9s - 10. She has reported worsening abdominal pain/sense of distention for last few days.  -With ongoing bleeding, discussed transfer to [facility] for double balloon endoscopy - [facility] declined on 12/1 and requests outpatient follow up instead  -Continue to monitor for bleeding in house, per our GI team no further endoscopy warranted  -Transfuse for Hgb >7   -IV Zofran  4 mg Q6hr PRN, scopolamine  patch, and prn PO Phenergan   -Trialing Lomitil to help with bowel frequency    - Asked GI 12/4 if any additional recs - could switch to trial of Imodium , but she would prefer to continue Lomotil     - Has prn Imodium  ordered  -CT enteroscopy 12/6   Since 01/27/2024:      -Grossly similar size of the ill-defined central mesenteric mass and right inferior rectus intramuscular masses.        -New mild asymmetric thickening of the small bowel leading into the ileoanal anastomosis which may be exaggerated due to under distention. There are a few additional mildly thickened small bowel loops in the lower abdomen and pelvis which could be seen with enteritis. No evidence of bowel obstruction.      -Additional chronic and incidental findings as described in the body of the report.     Syncope  - yesterday likely from volume depletion, will give 1 L IV and reassess tomorrow, she is dizzy when she stands up    Iron  def anemia from chronic blood loss  -S/p IV iron  inpatient. This was complicated by hives needing treatment with steroids and antihistamines. Discussed recommendation to discontinue IV Benadryl , but she has been on this for a long time for sense of throat swelling/face swelling after iron  infusion and has not tolerated PO Benadryl  in the past. Will continue to work on weaning this.  - checking iron  down to 45  - repeating pretreatment and Iron  infusion with iron  dextran     Desmoid Fibromatosis s/p Proctocolectomy with Ileoanal anastomosis  - Familial Adenomatous Polyposis - Concern for Breast Lump  Follows with Dr. Loris. Most recently trialed on nirogacestat , though recently, this has been held due to diarrhea and bleeding.  -Oncology aware of admission  -Breast US  obtained on 11/22 and did not show evidence of fluid collections or infection, but a 'full diagnostic evaluation' would need to be considered via Breast Imaging Department as an outpatient  -Obtained US  of abdomen 12/1 to ensure no changes to rectus sheath tumor/bleeding given ongoing pain. This showed no significant change from last CT scan.      Acute on Chronic Pain: Follows with [facility]. Per pain plan:  -Continue home Butrans  patch  -Oxycodone  20mg  Q4hr PRN, IV Dilaudid  1 mg Q4hr PRN   -Discussed recommendation to spread out Dilaudid  dosing - she feels that  she is in worse pain now than she has been though  -Baclofen  5mg  TID     Mild adrenal insufficiency: Tentative diagnosis during last admission  -Continue Hydrocortisone  10mg  daily, consider stress dosing if hypotensive     GERD:  -Continue home famotidine  20 mg nightly    Poor PO Intake  -RD consulted, saw patient again 12/4  -Supplements   -Gentle fluids for poor intake: continuous IV fluids have now stopped, encouraging PO hydration      Depression/Anxiety:  -Appreciate Psychology recs  -Continue home Lexapro  5 mg daily   -Continue home Ativan  0.5 mg PRN      Disposition: Home   DVT Prophylaxis: encourage ambulation  Diet: Regular        I personally spent greater than 55 minutes face-to-face and non-face-to-face in the care of this patient, which includes all pre, intra, and post visit time on the date of service.  All documented time was specific to the E/M visit and does not include any procedures that may have been performed.  __________________________________________________________________    Subjective:    She was having continued pain, and concerned about dark stools that were caused by peptobismol. She is still having pain, spent a while about what the changes are on CT will need to go in person to reading room to fully understand.     She also had syncope yesterday will give 1 L IVF.     Objective    Temp:  [36.7 ??C (98.1 ??F)-36.8 ??C (98.2 ??F)] 36.8 ??C (98.2 ??F)  Pulse:  [84-97] 90  Resp:  [18] 18  BP: (101-116)/(57-68) 101/57  SpO2:  [95 %-98 %] 96 %    Young woman sitting upright in bed  Regular rate  Normal WOB on RA, lung sounds clear  Abdomen with hyperactive bowel sounds (stable), non-peritonitic but with diffuse tenderness especially over R flankepigastric  lower abd, some movement in lower abd almost pulling around prior stoma scar  Extremities warm

## 2024-02-15 NOTE — Plan of Care (Signed)
 Shift Summary  HYDROmorphone  was administered twice for significant pain spikes, with partial relief noted after the first dose.   Ondansetron  was given for nausea and vomiting, and diphenhydramine  for allergies, with no further symptoms documented after administration.   Safety and fall prevention measures were consistently implemented throughout the shift, and no hospital-acquired injuries or skin issues were documented.   Patient declined some interventions for pain and constipation.   Overall, pain control was challenging with fluctuating levels, but no new complications or hospital-acquired conditions were noted.     Absence of Hospital-Acquired Illness or Injury: No hospital-acquired injuries or illnesses were documented during the shift; safety interventions such as environmental modifications, fall reduction measures, and pressure reduction techniques were consistently maintained. Integumentary assessment remained within defined limits and Braden score was stable at 21.     Absence of Allergy  Symptoms: Diphenhydramine  was administered once for allergies, and no further allergy  symptoms were documented after administration.     Optimal Pain Control and Function: Pain levels fluctuated significantly, with a sharp increase to 8/10 in the morning, decreasing to 3/10 after HYDROmorphone  administration, but rising again to 9/10 later in the day; pain location shifted from abdominal to generalized and pain type changed from chronic to acute. Patient declined some interventions and pain was described as aching/discomfort, with frequency changing from constant to intermittent.     Nausea and Vomiting Relief: Ondansetron  was administered PRN for nausea and vomiting, with no further documentation of symptoms after administration.

## 2024-02-15 NOTE — Consults (Signed)
 CVAD Liaison - Port Access Note    Indications:   [employer]    The CVAD Liaison has assessed this patient's port site. Appropriate Huber needle size was obtained.  Port was accessed and dressing applied per protocol.  Blood return was noted.  Line flushes freely.   Of note, patient allergic to biopatch. Current dressing is silverlon and diamond tegaderm. Diamond tegaderm was lifting. Patient okay with CHG gel. Used skin prep. Placed port gel and sorbaview shield dressing for better dressing adherence. Will reach out to cvad liaison team if Sorbaview sheild causes discomfort or itching.     The primary RN was notified.     Thank you for this consult,  Aldona FORBES Birmingham, RN BSN, CVAD Liaison     Consult Time 30 minutes (min)

## 2024-02-16 LAB — CBC
HEMATOCRIT: 32.2 % — ABNORMAL LOW (ref 34.0–44.0)
HEMOGLOBIN: 10.5 g/dL — ABNORMAL LOW (ref 11.3–14.9)
MEAN CORPUSCULAR HEMOGLOBIN CONC: 32.5 g/dL (ref 32.0–36.0)
MEAN CORPUSCULAR HEMOGLOBIN: 27.7 pg (ref 25.9–32.4)
MEAN CORPUSCULAR VOLUME: 85.1 fL (ref 77.6–95.7)
MEAN PLATELET VOLUME: 9.8 fL (ref 6.8–10.7)
PLATELET COUNT: 259 10*9/L (ref 150–450)
RED BLOOD CELL COUNT: 3.78 10*12/L — ABNORMAL LOW (ref 3.95–5.13)
RED CELL DISTRIBUTION WIDTH: 18.3 % — ABNORMAL HIGH (ref 12.2–15.2)
WBC ADJUSTED: 5.7 10*9/L (ref 3.6–11.2)

## 2024-02-16 LAB — SEDIMENTATION RATE: ERYTHROCYTE SEDIMENTATION RATE: 16 mm/h (ref 0–20)

## 2024-02-16 MED ADMIN — sodium chloride (NS) 0.9 % infusion: 20 mL/h | INTRAVENOUS | @ 17:00:00 | Stop: 2024-02-16

## 2024-02-16 MED ADMIN — acetaminophen (TYLENOL) tablet 1,000 mg: 1000 mg | ORAL | @ 07:00:00

## 2024-02-16 MED ADMIN — pantoprazole (Protonix) EC tablet 40 mg: 40 mg | ORAL | @ 22:00:00

## 2024-02-16 MED ADMIN — pantoprazole (Protonix) EC tablet 40 mg: 40 mg | ORAL | @ 13:00:00

## 2024-02-16 MED ADMIN — diphenhydrAMINE (BENADRYL) injection: 25 mg | INTRAVENOUS | @ 08:00:00

## 2024-02-16 MED ADMIN — diphenhydrAMINE (BENADRYL) injection: 25 mg | INTRAVENOUS | @ 22:00:00

## 2024-02-16 MED ADMIN — diphenhydrAMINE (BENADRYL) injection: 25 mg | INTRAVENOUS | @ 01:00:00

## 2024-02-16 MED ADMIN — HYDROmorphone (PF) (DILAUDID) injection 1 mg: 1 mg | INTRAVENOUS | @ 16:00:00 | Stop: 2024-02-18

## 2024-02-16 MED ADMIN — HYDROmorphone (PF) (DILAUDID) injection 1 mg: 1 mg | INTRAVENOUS | @ 20:00:00 | Stop: 2024-02-18

## 2024-02-16 MED ADMIN — HYDROmorphone (PF) (DILAUDID) injection 1 mg: 1 mg | INTRAVENOUS | @ 07:00:00 | Stop: 2024-02-18

## 2024-02-16 MED ADMIN — HYDROmorphone (PF) (DILAUDID) injection 1 mg: 1 mg | INTRAVENOUS | @ 02:00:00 | Stop: 2024-02-18

## 2024-02-16 MED ADMIN — HYDROmorphone (PF) (DILAUDID) injection 1 mg: 1 mg | INTRAVENOUS | @ 11:00:00 | Stop: 2024-02-18

## 2024-02-16 MED ADMIN — ondansetron (ZOFRAN) injection 4 mg: 4 mg | INTRAVENOUS | @ 20:00:00

## 2024-02-16 MED ADMIN — ondansetron (ZOFRAN) injection 4 mg: 4 mg | INTRAVENOUS | @ 08:00:00

## 2024-02-16 MED ADMIN — ondansetron (ZOFRAN) injection 4 mg: 4 mg | INTRAVENOUS | @ 01:00:00

## 2024-02-16 MED ADMIN — famotidine (PEPCID) tablet 20 mg: 20 mg | ORAL | @ 02:00:00

## 2024-02-16 MED ADMIN — hydrocortisone (CORTEF) tablet 10 mg: 10 mg | ORAL | @ 13:00:00

## 2024-02-16 MED ADMIN — oxyCODONE (ROXICODONE) immediate release tablet 20 mg: 20 mg | ORAL | @ 22:00:00 | Stop: 2024-02-20

## 2024-02-16 MED ADMIN — oxyCODONE (ROXICODONE) immediate release tablet 20 mg: 20 mg | ORAL | Stop: 2024-02-20

## 2024-02-16 MED ADMIN — oxyCODONE (ROXICODONE) immediate release tablet 20 mg: 20 mg | ORAL | @ 16:00:00 | Stop: 2024-02-20

## 2024-02-16 MED ADMIN — oxyCODONE (ROXICODONE) immediate release tablet 20 mg: 20 mg | ORAL | @ 09:00:00 | Stop: 2024-02-20

## 2024-02-16 MED ADMIN — oxyCODONE (ROXICODONE) immediate release tablet 20 mg: 20 mg | ORAL | @ 13:00:00 | Stop: 2024-02-20

## 2024-02-16 MED ADMIN — oxyCODONE (ROXICODONE) immediate release tablet 20 mg: 20 mg | ORAL | @ 05:00:00 | Stop: 2024-02-20

## 2024-02-16 MED ADMIN — escitalopram oxalate (LEXAPRO) tablet 5 mg: 5 mg | ORAL | @ 13:00:00

## 2024-02-16 MED ADMIN — loperamide (IMODIUM) capsule 2 mg: 2 mg | ORAL | @ 02:00:00

## 2024-02-16 MED ADMIN — diphenhydrAMINE (BENADRYL) injection: 25 mg | INTRAVENOUS | @ 18:00:00 | Stop: 2024-02-16

## 2024-02-16 MED ADMIN — iron dextran (INFED) 25 mg in sodium chloride (NS) 0.9 % 25 mL IVPB: 25 mg | INTRAVENOUS | @ 18:00:00 | Stop: 2024-02-16

## 2024-02-16 MED ADMIN — famotidine (PF) (PEPCID) injection 40 mg: 40 mg | INTRAVENOUS | @ 18:00:00 | Stop: 2024-02-16

## 2024-02-16 MED ADMIN — predniSONE (DELTASONE) tablet 50 mg: 50 mg | ORAL | @ 10:00:00 | Stop: 2024-02-16

## 2024-02-16 MED ADMIN — sodium chloride 0.9% (NS) bolus 500 mL: 500 mL | INTRAVENOUS | @ 19:00:00 | Stop: 2024-02-16

## 2024-02-16 MED ADMIN — iron dextran (INFED) 1,000 mg in sodium chloride (NS) 0.9 % 500 mL IVPB: 1000 mg | INTRAVENOUS | @ 20:00:00 | Stop: 2024-02-16

## 2024-02-16 MED ADMIN — predniSONE (DELTASONE) tablet 50 mg: 50 mg | ORAL | @ 05:00:00 | Stop: 2024-02-15

## 2024-02-16 MED ADMIN — methylPREDNISolone sodium succinate (SOLU-Medrol) injection 40 mg: 40 mg | INTRAVENOUS | @ 18:00:00 | Stop: 2024-02-16

## 2024-02-16 MED ADMIN — acetaminophen (TYLENOL) tablet 650 mg: 650 mg | ORAL | @ 18:00:00 | Stop: 2024-02-16

## 2024-02-16 NOTE — Plan of Care (Signed)
 Shift Summary  HYDROmorphone  was administered twice for persistent abdominal pain, but pain scores remained unchanged at 8/10.   Ondansetron  was given for nausea, with no further episodes documented after administration.   Patient ambulated independently and completed activities of daily living without symptoms or assistive devices.   Fall prevention strategies were maintained throughout the shift, and no falls or injuries occurred.   Vital signs and laboratory results remained stable, and overall status was unchanged during the shift.    Optimal Pain Control and Function: Abdominal pain remained constant at a high level (8/10) throughout the shift, with HYDROmorphone  administered twice for pain management but no reduction in pain score noted. Patient was able to ambulate independently and complete activities without reported symptoms during activity.    Absence of Fall and Fall-Related Injury: Fall reduction interventions were consistently maintained, including regular toileting, hourly visual checks, and use of nonskid footwear; no falls or injuries occurred during the shift.    Nausea and Vomiting Relief: Ondansetron  was administered PRN for nausea, and no further documentation of nausea or vomiting was noted after administration.    Absence of Infection Signs and Symptoms: Temperature remained stable and within normal limits, and WBC count was within reference range on CBC.    Improved Ability to Complete Activities of Daily Living: Patient ambulated 2400 ft twice during the shift without assistive devices or symptoms, and was able to feed herself and complete activities independently.

## 2024-02-16 NOTE — Consults (Signed)
 [facility])   Consultation - Liaison (CL) Psychiatry  CL Psychology Follow-Up Note      Service Date:  02/16/24  Admit Date:  01/28/24  Clinician:  Olam Littles, PsyD  Intervention: 60 min Individual Psychotherapy  Face-to-Face Clinical Contact with Patient: 58 min  Pre-/Post-Encounter Activities: 10 min  Method of Interaction: In-Person    BACKGROUND INFORMATION AND REASON FOR REFERRAL:  Please see H&P for full details. 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 She was admitted to the Hospitalist service for management of the concerns above. Pt is well-known to clinical research associate from previous admissions. Please see prior notes for further details on psychosocial history. Psychology consulted to address psychological factors affecting Pt's medical condition.     Interval events:  -No significant adverse behavioral/psychiatric events since last seen by Psychology     ASSESSMENT  IMPRESSIONS/SUMMARY    At follow-up, Pt's mood appeared modestly improved. Session topics included emotion-focused coping approaches to enhance tolerating uncertainty. Pt actively engaged and receptive to intervention.     DIAGNOSTIC IMPRESSIONS    Major depressive disorder, recurrent episode, moderate  Generalized anxiety disorder  Posttraumatic stress disorder    Risk Assessment:  ASQ screening result: no intervention is necessary    -A full risk assessment was previously performed on 11/21.  Risk assessment remains essentially unchanged.    Current suicide risk: low risk  Current homicide risk: low risk      PLAN  RECOMMENDATIONS    ## Safety and Observation Level:   -This patient is not currently under IVC. If safety concerns arise, please page psychiatry for an evaluation. Recommend routine level of observation per primary team.    ## Follow-up:  - The patient desires ongoing follow-up from CL Psychology while medically inpatient.    ## Disposition:  -There are no psychological contraindications to discharging this patient when medically appropriate.   -Pt interested in scheduling outpatient therapy appointment with writer. Appointment coordinated for 12/11 at 11AM.  - When this patient is discharged, please ensure that their AVS includes information about the 93 Suicide & Crisis Lifeline.    ## Behavioral/environmental:   -Please adhere to delirium (prevention) protocol.   -Efforts to encourage normal circadian function are recommended.  Specifically, exposure to natural light during the day, minimization of light at night, consistent/routine times for sleep/wake, and avoiding of daytime naps (if possible) may be helpful.   -Please consolidate care during the night when feasible.    Olam GORMAN Littles, PsyD  Clinical Psychologist       SUBJECTIVE  SESSION CONTENT     Session began with a brief check-in. Pt shared she was able to complete some of her finals coursework yesterday and is feeling less anxious as a result. Pt recounted updates pertaining to hear health and plan of care, including aspects of uncertainty. Pt engaged in exploration of emotion-focused approaches for tolerating uncertainty.     OBJECTIVE   MENTAL STATUS     Orientation and consciousness: AOx4   Appearance: Appears stated age  Behavior: Calm, Cooperative, Direct eye contact, and Polite  Speech: Within normal limits  Language: Intact  Mood: Euthymic  Affect: Full and Mood congruent  Perceptual disturbance (hallucinations, illusions): None  Thought process and association: Linear and coherent  Thought content (delusions, obsessions etc.): None  Insight: Fair  Judgement: Fair  Memory: WNL, although not formally assessed     EDUCATION/INTERVENTIONS:    -Completed mood check-in and reviewed the patient's current stressors and strategies to manage stress.  -Cognitive behavioral therapy  -Discussed the patient's treatment goals, revisited discussion of treatment options, and  explored goals of care.  -Continued to discuss pertinent findings with Pt's care team

## 2024-02-16 NOTE — Progress Notes (Signed)
 Daily Progress Note    Assessment/Plan:    [name] [name] is a 23 y.o. 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    Acute on chronic LGI bleeding - Iron  Deficiency: Increased bleeding and pain for the past ~two weeks in the setting of prior colectomy and ileoanal anastamosis as below. Has had prior ulcers affecting her J pouch and history of prior GIBs. EGD and pouchoscopy 11/21 noted multiple (30-50) gastric polyps, no specimens collected and no evidence of pouchitis.  Biopsy did reveal adenoma (not high grade) that will need resection. She continues to have frequent BMs (>10/day) and Hgb has remained stable in 9s - 10. She has reported worsening abdominal pain/sense of distention and pulling lower abd and now epigastric and right flank  -With ongoing bleeding, discussed transfer to [facility] for double balloon endoscopy - [facility] declined on 12/1 and requests outpatient follow up instead end of December  -Continue to monitor for bleeding in house, per our GI team no further endoscopy warranted, I am concerned there is intermittent ischemia. Evaluating the CT enteroscopy with Radiology today 12/9, narrowing of SMA significantly and concerned she may have intermittent ischemic bowel after larger meals and then bleed slightly from mucosa  -Transfuse for Hgb >7   -IV Zofran  4 mg Q6hr PRN, scopolamine  patch, and prn PO Phenergan   -Trialing Lomitil to help with bowel frequency    - Asked GI 12/4 if any additional recs - could switch to trial of Imodium , but she would prefer to continue Lomotil     - Has prn Imodium  ordered  -CT enteroscopy 12/6   Since 01/27/2024:      -Grossly similar size of the ill-defined central mesenteric mass and right inferior rectus intramuscular masses.        -New mild asymmetric thickening of the small bowel leading into the ileoanal anastomosis which may be exaggerated due to under distention. There are a few additional mildly thickened small bowel loops in the lower abdomen and pelvis which could be seen with enteritis. No evidence of bowel obstruction.      -Additional chronic and incidental findings as described in the body of the report.     Syncope  - yesterday likely from volume depletion, will give 1 L IV and reassess tomorrow, she is dizzy when she stands up    Iron  def anemia from chronic blood loss  -S/p IV iron  inpatient. This was complicated by hives needing treatment with steroids and antihistamines. Discussed recommendation to discontinue IV Benadryl , but she has been on this for a long time for sense of throat swelling/face swelling after iron  infusion and has not tolerated PO Benadryl  in the past. Will continue to work on weaning this.  - checking iron  down to 45  - repeating pretreatment and Iron  infusion with iron  dextran appreciate Heme rec for pretreat   1gm IV iron  per usual regimen with premedications:  - cetirizine  20 mg twice daily for 48 hours prior to infusion and at least 72 hours after;   - prednisone  50 mg x 2 doses at 13 and 7 hours prior to infusion   then methylprednisolone  40 mg IV immediately prior to infusion;   - diphenhydramine  50 mg IV, famotidine  40 mg IV 30 minutes prior to infusion.   --> Then give iron  dextran 1 g  Will give post infusion IV NS bolus and pre infusion IV NS bolus    Desmoid Fibromatosis s/p Proctocolectomy  with Ileoanal anastomosis  - Familial Adenomatous Polyposis - Concern for Breast Lump  Follows with Dr. Loris. Most recently trialed on nirogacestat , though recently, this has been held due to diarrhea and bleeding.  -Oncology aware of admission  -Breast US  obtained on 11/22 and did not show evidence of fluid collections or infection, but a 'full diagnostic evaluation' would need to be considered via Breast Imaging Department as an outpatient  -Obtained US  of abdomen 12/1 to ensure no changes to rectus sheath tumor/bleeding given ongoing pain. This showed no significant change from last CT scan.   - follow up with CTA to evaluate SMA and mesentery as dermoid tumors present     Acute on Chronic Pain: Follows with [facility]. Per pain plan:  -Continue home Butrans  patch  -Oxycodone  20mg  Q4hr PRN, IV Dilaudid  1 mg Q4hr PRN   -Discussed recommendation to spread out Dilaudid  dosing - she feels that she is in worse pain now than she has been though  -Baclofen  5mg  TID     Mild adrenal insufficiency: Tentative diagnosis during last admission  -Continue Hydrocortisone  10mg  daily, consider stress dosing if hypotensive     GERD:  -Continue home famotidine  20 mg nightly    Poor PO Intake  -RD consulted, saw patient again 12/4  -Supplements   - encouraging PO hydration      Depression/Anxiety:  -Appreciate Psychology recs  -Continue home Lexapro  5 mg daily   -Continue home Ativan  0.5 mg PRN      Disposition: Home   DVT Prophylaxis: encourage ambulation  Diet: Regular        I personally spent greater than 55 minutes face-to-face and non-face-to-face in the care of this patient, which includes all pre, intra, and post visit time on the date of service.  All documented time was specific to the E/M visit and does not include any procedures that may have been performed.  __________________________________________________________________    Subjective:    She was getting the infusion of iron  and there was a misstep where I did not give her the full dose of Benadryl  we will give her a dose after infusion as well as some IV fluids hopefully we can avert any reaction.  Went to radiology and reviewed the CT and the radiologist noted how the SMA tapered down as it went into the mesentery where the desmoid tumors are present and how there was mesenteritis and showed me where the thickened small bowel was.  Due to this called radiology discussed whether it would be worth having CTA after discussion with oncology.  Essentially am concerned that if the desmoid tumors and the tethering of the mesentery are causing intermittent ischemia and pain which are leading to the GI bleeding which is preventing treatment with the 2 treatment options that were previously mentioned.  She may be in a very difficult spot if we cannot treat or fix the current situation.  But if there is not evidence of intermittent ischemia then she may be able to wait for double-balloon endoscopy done at [facility] at the end of the month.    Objective    Temp:  [36.8 ??C (98.2 ??F)-37 ??C (98.6 ??F)] 37 ??C (98.6 ??F)  Pulse:  [69-94] 92  Resp:  [16-19] 18  BP: (93-123)/(61-81) 118/65  SpO2:  [98 %-100 %] 100 %    Young woman sitting upright in bed  Regular rate  Normal WOB on RA, lung sounds clear  Abdomen with hyperactive bowel sounds (stable),  diffuse tenderness especially over R flankepigastric  lower abd, some movement in lower abd almost pulling around prior stoma scar  Extremities warm

## 2024-02-16 NOTE — Plan of Care (Signed)
 Shift Summary  Pain management was supported by two HYDROmorphone  administrations, resulting in a slight decrease in pain score and intermittent sleep.  Nausea and allergy  symptoms were addressed with ondansetron  and diphenhydrAMINE  at two separate times each.  Loperamide  was given for loose stools, while diphenoxylate -atropine  was refused.  Skin integrity remained stable and nutrition was adequate throughout the shift.  Overall, pain was present but slightly improved, and GI symptoms were managed with PRN medications.  Iron  infusing clarified with mother.     Optimal Pain Control and Function: Abdominal pain remained present but decreased slightly from 8 to 7 on the pain scale after HYDROmorphone  administration; pain became constant and chronic, and sleep was achieved intermittently during the shift.    Skin Health and Integrity: Skin remained within defined limits throughout the shift, with no changes noted in Braden Scale score or skin assessment.    Improved Nutritional Intake: Oral intake was 240 mL and nutrition was assessed as adequate during the shift.

## 2024-02-17 LAB — CBC
HEMATOCRIT: 29.7 % — ABNORMAL LOW (ref 34.0–44.0)
HEMOGLOBIN: 9.8 g/dL — ABNORMAL LOW (ref 11.3–14.9)
MEAN CORPUSCULAR HEMOGLOBIN CONC: 33.1 g/dL (ref 32.0–36.0)
MEAN CORPUSCULAR HEMOGLOBIN: 28.1 pg (ref 25.9–32.4)
MEAN CORPUSCULAR VOLUME: 85 fL (ref 77.6–95.7)
MEAN PLATELET VOLUME: 9.7 fL (ref 6.8–10.7)
PLATELET COUNT: 235 10*9/L (ref 150–450)
RED BLOOD CELL COUNT: 3.49 10*12/L — ABNORMAL LOW (ref 3.95–5.13)
RED CELL DISTRIBUTION WIDTH: 18.4 % — ABNORMAL HIGH (ref 12.2–15.2)
WBC ADJUSTED: 10.7 10*9/L (ref 3.6–11.2)

## 2024-02-17 LAB — COMPREHENSIVE METABOLIC PANEL
ALBUMIN: 3.1 g/dL — ABNORMAL LOW (ref 3.4–5.0)
ALKALINE PHOSPHATASE: 52 U/L (ref 46–116)
ALT (SGPT): 11 U/L (ref 10–49)
ANION GAP: 12 mmol/L (ref 5–14)
AST (SGOT): 15 U/L (ref <=34)
BILIRUBIN TOTAL: <0.2 mg/dL — ABNORMAL LOW (ref 0.3–1.2)
BLOOD UREA NITROGEN: 10 mg/dL (ref 9–23)
BUN / CREAT RATIO: 22
CALCIUM: 8.4 mg/dL — ABNORMAL LOW (ref 8.7–10.4)
CHLORIDE: 107 mmol/L (ref 98–107)
CO2: 25 mmol/L (ref 20.0–31.0)
CREATININE: 0.45 mg/dL — ABNORMAL LOW (ref 0.55–1.02)
EGFR CKD-EPI (2021) FEMALE: >90 mL/min/1.73m2 (ref >=60)
GLUCOSE RANDOM: 116 mg/dL (ref 70–179)
POTASSIUM: 3.7 mmol/L (ref 3.4–4.8)
PROTEIN TOTAL: 5.7 g/dL (ref 5.7–8.2)
SODIUM: 144 mmol/L (ref 135–145)

## 2024-02-17 LAB — C-REACTIVE PROTEIN: C-REACTIVE PROTEIN: <5 mg/L (ref <=10.0)

## 2024-02-17 MED ADMIN — acetaminophen (TYLENOL) tablet 1,000 mg: 1000 mg | ORAL | @ 17:00:00

## 2024-02-17 MED ADMIN — acetaminophen (TYLENOL) tablet 1,000 mg: 1000 mg | ORAL | @ 23:00:00

## 2024-02-17 MED ADMIN — acetaminophen (TYLENOL) tablet 1,000 mg: 1000 mg | ORAL | @ 01:00:00

## 2024-02-17 MED ADMIN — acetaminophen (TYLENOL) tablet 1,000 mg: 1000 mg | ORAL | @ 06:00:00

## 2024-02-17 MED ADMIN — pantoprazole (Protonix) EC tablet 40 mg: 40 mg | ORAL | @ 23:00:00

## 2024-02-17 MED ADMIN — pantoprazole (Protonix) EC tablet 40 mg: 40 mg | ORAL | @ 14:00:00

## 2024-02-17 MED ADMIN — diphenhydrAMINE (BENADRYL) injection: 25 mg | INTRAVENOUS | @ 05:00:00 | Stop: 2024-02-17

## 2024-02-17 MED ADMIN — diphenhydrAMINE (BENADRYL) injection: 25 mg | INTRAVENOUS | @ 11:00:00 | Stop: 2024-02-17

## 2024-02-17 MED ADMIN — diphenhydrAMINE (BENADRYL) injection: 25 mg | INTRAVENOUS | @ 18:00:00

## 2024-02-17 MED ADMIN — diphenhydrAMINE (BENADRYL) injection: 25 mg | INTRAVENOUS | @ 23:00:00

## 2024-02-17 MED ADMIN — HYDROmorphone (PF) (DILAUDID) injection 1 mg: 1 mg | INTRAVENOUS | @ 09:00:00 | Stop: 2024-02-18

## 2024-02-17 MED ADMIN — HYDROmorphone (PF) (DILAUDID) injection 1 mg: 1 mg | INTRAVENOUS | @ 01:00:00 | Stop: 2024-02-18

## 2024-02-17 MED ADMIN — HYDROmorphone (PF) (DILAUDID) injection 1 mg: 1 mg | INTRAVENOUS | @ 05:00:00 | Stop: 2024-02-18

## 2024-02-17 MED ADMIN — HYDROmorphone (PF) (DILAUDID) injection 1 mg: 1 mg | INTRAVENOUS | @ 18:00:00 | Stop: 2024-02-18

## 2024-02-17 MED ADMIN — HYDROmorphone (PF) (DILAUDID) injection 1 mg: 1 mg | INTRAVENOUS | @ 14:00:00 | Stop: 2024-02-18

## 2024-02-17 MED ADMIN — HYDROmorphone (PF) (DILAUDID) injection 1 mg: 1 mg | INTRAVENOUS | @ 23:00:00 | Stop: 2024-02-18

## 2024-02-17 MED ADMIN — ondansetron (ZOFRAN) injection 4 mg: 4 mg | INTRAVENOUS | @ 11:00:00

## 2024-02-17 MED ADMIN — cetirizine (ZYRTEC) tablet 20 mg: 20 mg | ORAL | @ 11:00:00

## 2024-02-17 MED ADMIN — cetirizine (ZYRTEC) tablet 20 mg: 20 mg | ORAL | @ 01:00:00

## 2024-02-17 MED ADMIN — famotidine (PEPCID) tablet 20 mg: 20 mg | ORAL | @ 01:00:00

## 2024-02-17 MED ADMIN — oxyCODONE (ROXICODONE) immediate release tablet 20 mg: 20 mg | ORAL | @ 02:00:00 | Stop: 2024-02-20

## 2024-02-17 MED ADMIN — oxyCODONE (ROXICODONE) immediate release tablet 20 mg: 20 mg | ORAL | @ 06:00:00 | Stop: 2024-02-20

## 2024-02-17 MED ADMIN — oxyCODONE (ROXICODONE) immediate release tablet 20 mg: 20 mg | ORAL | @ 11:00:00 | Stop: 2024-02-20

## 2024-02-17 MED ADMIN — oxyCODONE (ROXICODONE) immediate release tablet 20 mg: 20 mg | ORAL | @ 15:00:00 | Stop: 2024-02-20

## 2024-02-17 MED ADMIN — oxyCODONE (ROXICODONE) immediate release tablet 20 mg: 20 mg | ORAL | @ 21:00:00 | Stop: 2024-02-20

## 2024-02-17 MED ADMIN — diphenoxylate-atropine (LOMOTIL) 2.5-0.025 mg per tablet 1 tablet: 1 | ORAL | @ 01:00:00

## 2024-02-17 MED ADMIN — escitalopram oxalate (LEXAPRO) tablet 5 mg: 5 mg | ORAL | @ 14:00:00

## 2024-02-17 MED ADMIN — diphenhydrAMINE (BENADRYL) injection 25 mg: 25 mg | INTRAVENOUS | @ 01:00:00 | Stop: 2024-02-16

## 2024-02-17 MED ADMIN — methylPREDNISolone sodium succinate (SOLU-Medrol) injection 40 mg: 40 mg | INTRAVENOUS | Stop: 2024-02-19

## 2024-02-17 MED ADMIN — famotidine (PF) (PEPCID) injection 20 mg: 20 mg | INTRAVENOUS | @ 12:00:00 | Stop: 2024-02-17

## 2024-02-17 MED ADMIN — diphenhydrAMINE (BENADRYL) injection: 50 mg | INTRAVENOUS | @ 02:00:00 | Stop: 2024-02-16

## 2024-02-17 MED ADMIN — sodium chloride 0.9% (NS) bolus 500 mL: 500 mL | INTRAVENOUS | @ 02:00:00 | Stop: 2024-02-16

## 2024-02-17 MED ADMIN — diphenhydrAMINE (BENADRYL) injection: 50 mg | INTRAVENOUS | @ 14:00:00 | Stop: 2024-02-17

## 2024-02-17 MED ADMIN — methylPREDNISolone sodium succinate (SOLU-Medrol) injection 125 mg: 125 mg | INTRAVENOUS | @ 12:00:00 | Stop: 2024-02-17

## 2024-02-17 MED ADMIN — sodium chloride 0.9% (NS) bolus 1,000 mL: 1000 mL | INTRAVENOUS | @ 23:00:00 | Stop: 2024-02-17

## 2024-02-17 NOTE — Plan of Care (Signed)
 PT admitted for GI bleed. Pt is alert and oriented x4, room air, vss. Pt rcvd prn and scheduled pain meds and benadryl . Pt on tele and O2 monitoring, no calls. Pt  refused lomotil  this shift. Pt has bolus of NS running. Port assessed, flushed, labs drawn. Pt voids and ambulates independently. Pt has bed in lowest position, call light within reach, bed alarm off.   Shift Summary  Diphenhydramine  was given twice for allergy  management, while hydrocortisone  and diphenoxylate -atropine  were refused.   HYDROmorphone  was administered twice for abdominal pain, but pain scores remained high and the patient declined further intervention.   Fall prevention measures were maintained throughout the shift, with no reported falls or injuries.   The patient experienced persistent abdominal pain and a generalized rash, with overall status stable and no acute events reported.     Absence of Allergy  Symptoms: Diphenhydramine  was administered twice during the shift, and hydrocortisone  was refused; famotidine  was given for moderate symptoms, and no new allergy  symptoms were documented.     Optimal Pain Control and Function: Abdominal pain remained chronic and constant, fluctuating between high and moderate intensity; HYDROmorphone  was administered twice, but pain scores did not show sustained improvement, and the patient declined further intervention.     Absence of Fall and Fall-Related Injury: Fall reduction strategies were consistently maintained, including low bed position, side rails, and hourly visual checks, with no falls or injuries reported.     Skin Health and Integrity: A generalized rash and redness were noted with exceptions to normal skin findings, and the patient refused hydrocortisone ; Braden score remained high, indicating low risk for pressure injury.

## 2024-02-17 NOTE — Consults (Signed)
 [facility])   Consultation - Liaison (CL) Psychiatry  CL Psychology Follow-Up Note      Service Date:  02/17/24  Admit Date:  01/28/24  Clinician:  Olam Littles, PsyD  Intervention: 45 min Individual Psychotherapy  Face-to-Face Clinical Contact with Patient: 45 min  Pre-/Post-Encounter Activities: 15 min  Method of Interaction: In-Person    BACKGROUND INFORMATION AND REASON FOR REFERRAL:  Please see H&P for full details. 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 She was admitted to the Hospitalist service for management of the concerns above. Pt is well-known to clinical research associate from previous admissions. Please see prior notes for further details on psychosocial history. Psychology consulted to address psychological factors affecting Pt's medical condition.     Interval events:  -No adverse behavioral/psychiatric events since last seen by Psychology   -Recent progress note indicates Pt will undergo CTA for further evaluation of potential causes of ongoing GI bleeding     ASSESSMENT  IMPRESSIONS/SUMMARY    At follow-up, Pt endorsed emotional response to feedback from CT and plan for CTA (e.g., sadness, anticipatory anxiety) that is appropriate and reasonable to circumstances. Session included supportive space to facilitate emotional processing and expression. Pt actively engaged and receptive to intervention.     DIAGNOSTIC IMPRESSIONS    Major depressive disorder, recurrent episode, moderate  Generalized anxiety disorder  Posttraumatic stress disorder    Risk Assessment:  ASQ screening result: no intervention is necessary    -A full risk assessment was previously performed on 11/21.  Risk assessment remains essentially unchanged.    Current suicide risk: low risk  Current homicide risk: low risk      PLAN  RECOMMENDATIONS    ## Safety and Observation Level:   -This patient is not currently under IVC. If safety concerns arise, please page psychiatry for an evaluation. Recommend routine level of observation per primary team.    ## Follow-up:  - The patient desires ongoing follow-up from CL Psychology while medically inpatient.    ## Disposition:  -There are no psychological contraindications to discharging this patient when medically appropriate.   -Pt interested in scheduling outpatient therapy appointment with writer. Appointment coordinated for 12/11 at 11AM.  - When this patient is discharged, please ensure that their AVS includes information about the 52 Suicide & Crisis Lifeline.    ## Behavioral/environmental:   -Please adhere to delirium (prevention) protocol.   -Efforts to encourage normal circadian function are recommended.  Specifically, exposure to natural light during the day, minimization of light at night, consistent/routine times for sleep/wake, and avoiding of daytime naps (if possible) may be helpful.   -Please consolidate care during the night when feasible.    Olam GORMAN Littles, PsyD  Clinical Psychologist       SUBJECTIVE  SESSION CONTENT     Session began with a brief check-in. Pt recounted updates pertaining to her medical situation, including results from CT scan and plan for CTA to evaluate potential causes of ongoing GI bleed. Pt provided space to process complex emotional reactions to this news. She shared feeling sad about CT results and endorsed some anticipatory anxiety about CTA. She also expressed feeling confident with plan of care and hopeful that workup will provide clarity. Pt also expressed sadness that these symptoms have been overlooked and dismissed in prior hospitalizations and expressed appreciation for  care she has received during this admission. Pt's emotional experiences were validated and normalized. Supportive interventions were used to reinforce coping efforts.     OBJECTIVE   MENTAL STATUS Orientation and consciousness: AOx4   Appearance: Appears stated age  Behavior: Calm, Cooperative, Direct eye contact, and Polite  Speech: Within normal limits  Language: Intact  Mood: Dysphoric  Affect: Full and Mood congruent  Perceptual disturbance (hallucinations, illusions): None  Thought process and association: Linear and coherent  Thought content (delusions, obsessions etc.): None  Insight: Fair  Judgement: Fair  Memory: WNL, although not formally assessed     EDUCATION/INTERVENTIONS:    -Completed mood check-in and reviewed the patient's current stressors and strategies to manage stress.  -Supportive interventions   -Discussed the patient's treatment goals, revisited discussion of treatment options, and  explored goals of care.  -Continued to discuss pertinent findings with Pt's care team

## 2024-02-17 NOTE — Consults (Signed)
 Oncology Consult Note    Requesting Attending Physician :  Guido Bland Dennise Idella, MD  Service Requesting Consult : [facility])  Reason for Consult: [name] associated desmoid fibromatosis  Primary Oncologist: Dr. Loris    Assessment: [name] [name] is a 23 y.o. female with [name] associated desmoid fibromatosis, history of proctocolectomy with ileoanal anastamosis (2022) with numerous post-operative challenges including malnutrition, chronic pain, depression/anxiety, who was admitted on 01/27/24 for recurrent abdominal pain, pre-syncope, and hematochezia. Oncology was consulted for evaluation of her desmoid fibromatosis and to weigh in on considerations for management.    Regarding her hospital course, she underwent EGD and pouchoscopy on 01/29/24, which was notable for multiple gastric polyps but otherwise normal. Pouchoscopy revealed abnormal rectal cuff mucosa that was biopsied and consistent with an adenomatous polyp. Per discussion with GI, tagged RBC study was thought to not be high yield and they favored a double balloon enteroscopy at [facility], scheduled for 03/08/24. She continues to have frequent diarrhea, hematochezia, abdominal pain, and poor PO intake.    From an oncology perspective, to summarize her history, she has been followed by medical oncology with numerous sites of her desmoid fibromatosis, including paraspinal, chest wall, right arm/wrist. She had received several cryoablations that had offered clinical benefit. She has also previously trialed sorafenib  with good tumor response, most recently from 12/10/21 to January of 2024, but with significant intolerance due to dizziness, fatigue, hair loss, GI side effects, ultimately held due to GI bleeding. She started nirogacestat  on 09/16/22, which has been held intermittently due to diarrhea and frequent hospitalizations, now off treatment since 11/03/23.    Overall she has noticed that her more superficial desmoid tumors have increased in size. Furthermore, while imaging does not suggest substantial increase in the size of her central mesenteric mass, this mass does seem to be slowly increasing over the past year. She notes new left breast masses as well, which we would recommend imaging by MRI.    We suspect that the patients desmoid fibromatosis is most likely driving the majority of her symptoms, although the exact mechanism is not entirely clear, possibly due to tethering of bowel and/or intermittent ischemia. Ultimately we suspect that she would benefit most from sustained treatment for the desmoid fibromatosis without interruption, with significant support for symptoms and treatment-related side effects. Considerations we discussed today include (1) restarting nirogacestat , (2) doxil, or (3) restarting sorafenib , although there are concerns of more severe GI bleeding with this option. We will discuss possible treatment options with her primary oncologist to help determine a long term treatment plan.    For symptom management, we would recommend palliative care consult with ongoing palliative care involvement in the outpatient setting. We also discussed the possibility of bowel rest with TPN, which the patient has done previously. We will let the patient help guide this decision. If this is pursued, we would use her port if possible given previous PICC-associated DVTs.    Recommendations:   - Agree with CTA to evaluate for mesenteric ischemia  - MRI left breast  - Palliative care consult  - Consider bowel rest/TPN  - We will discuss treatment options with her primary oncologist to formalize a treatment plan    This patient has been staffed with Dr. Orvis. These recommendations were discussed with the primary team.     We will continue to follow. Please contact the oncology consult fellow at [phone] with any further questions.    Ozell DELENA Loach, MD  Oncology Fellow    -------------------------------------------------------------  HPI: [name] [name] is a 23 y.o. female with [name] associated desmoid fibromatosis, history of proctocolectomy with ileoanal anastamosis (2022) with numerous post-operative challenges including malnutrition, chronic pain, depression/anxiety.    She was admitted on 01/27/24 for recurrent abdominal pain, pre-syncope, and hematochezia. She underwent EGD and pouchoscopy on 01/29/24, which was notable for multiple gastric polyps but otherwise normal. Pouchoscopy revealed abnormal rectal cuff mucosa that was biopsied and consistent with an adenomatous polyp. Per discussion with GI, tagged RBC study was thought to not be high yield and they favored a double balloon enteroscopy at [facility], scheduled for 03/08/24.    She continues to have frequent diarrhea, hematochezia, abdominal pain, and poor PO intake. She also notes that over the past several months she has been noticing gradual increase in size of her known desmoid tumors. She feels that her superficial lesions have responded well to cryoablation in the past. She has also responded well to sorafenib  but this has been stopped due to GI bleeding. Overall, she wishes she could be on sustained treatment for her desmoid tumors, but her current nirogacestat  has been held intermittent for hospitalizations or significant side effects, most recently frequent diarrhea. She notes that with nirogacestat  specifically, it is difficult for her to get through symptoms (diarrhea mainly) when restarting and gradually uptitrating the dose. She reports 7-10 bowel movements daily as her new baseline even off treatment, and more frequent bowel movements when she is bleeding.    Review of Systems: All positive and pertinent negatives are noted in the HPI; a 10 system review of systems was otherwise negative except as noted in HPI.    Oncologic History:  Hematology/Oncology History    No problem history exists.     Oncology history is briefly summarized in her assessment and plan. See most recent outpatient note from Dr. Loris on 12/29/23 for her oncology history in more detail.    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       Allergies: is allergic to adhesive tape-silicones; ferrlecit  [sodium ferric gluconat-sucrose]; levofloxacin ; methylnaltrexone ; neomycin; papaya; morphine; zosyn [piperacillin-tazobactam]; adhesive; compazine [prochlorperazine]; iron  analogues; metoclopramide ; metronidazole ; reglan  [metoclopramide  hcl]; iron  dextran; and latex, natural rubber.    Medications:   Meds:Scheduled Medications[5]  Continuous Infusions:Infusions Meds[6]  PRN Meds:.PRN Medications[7]    Objective:   Vitals: Temp:  [36 ??C (96.8 ??F)-37 ??C (98.6 ??F)] 37 ??C (98.6 ??F)  Pulse:  [69-110] 77  Resp:  [14-18] 18  BP: (93-123)/(52-73) 101/52  MAP (mmHg):  [67-87] 67  SpO2:  [92 %-100 %] 92 %  No intake/output data recorded.    Physical Exam:  BP 101/52  - Pulse 77  - Temp 37 ??C (98.6 ??F)  - Resp 18  - Ht 165.1 cm (5' 5)  - Wt 57.1 kg (125 lb 12.8 oz)  - LMP 01/18/2024 (Exact Date)  - SpO2 92%  - BMI 20.93 kg/m??    General appearance - alert, sitting upright, in no acute distress  Mental status - alert, oriented to person, place, and time   Eyes - pupils equal and reactive, extraocular eye movements intact   Nose - normal and patent, no erythema, discharge or polyps   Mouth - mucous membranes moist, pharynx normal  without lesions   Pulmonary - normal work of breathing on room air  Gastrointestinal - non-distended  Neurological - alert, oriented, normal speech, no focal findings or movement disorder noted   Musculoskeletal - no joint tenderness, deformity or swelling   Skin - scattered palpable nodules on R lower extremity, R scapula, L breast    Test Results  Lab Results   Component Value Date    WBC 5.7 02/16/2024 HGB 10.5 (L) 02/16/2024    HCT 32.2 (L) 02/16/2024    PLT 259 02/16/2024     Lab Results   Component Value Date    NA 142 02/14/2024    K 3.6 02/14/2024    CL 104 02/14/2024    CO2 27.0 02/14/2024    BUN 9 02/14/2024    CREATININE 0.54 (L) 02/14/2024    GLU 133 02/14/2024    CALCIUM 9.1 02/14/2024    MG 1.8 02/14/2024    PHOS 3.3 02/01/2024     Lab Results   Component Value Date    BILITOT <0.2 (L) 02/09/2024    BILIDIR <0.10 02/01/2024    PROT 5.3 (L) 02/09/2024    ALBUMIN 2.9 (L) 02/09/2024    ALT 16 02/09/2024    AST 17 02/09/2024    ALKPHOS 69 02/09/2024    GGT 13 10/31/2011     Lab Results   Component Value Date    INR 0.98 02/10/2024    APTT 33.7 03/25/2023       Imaging: Radiology studies were personally reviewed         [1]   Past Medical History:  Diagnosis Date    Abdominal pain     Acid reflux     occas    Anesthesia complication     itching, shaking, coldness; last few surgeries have gone much better    Cancer    (CMS-HCC)     Cataract of right eye     COVID-19 virus infection 01/2019    Cyst of thyroid determined by ultrasound     monitoring    [name]     2 right forearm, 1 left thigh, 1 right scapula, 1 under left clavicle; multiple    Difficult intravenous access     [name] (familial adenomatous polyposis)     [name] (HHS-HCC)     Gastric polyps     History of chemotherapy     last treatment approx 05/2019    History of colon polyps     History of COVID-19 01/2019    Ileus    (CMS-HCC) 03/16/2022    Iron  deficiency anemia due to chronic blood loss     received iron  infusion 11-2019    PONV (postoperative nausea and vomiting)     Rectal bleeding     Syncopal episodes     especially if becoming dehydrated   [2]   Past Surgical History:  Procedure Laterality Date    COLON SURGERY      cyroablation      cystis removal      desmoid removal      IR INSERT PORT AGE GREATER THAN 5 YRS  07/20/2023    IR INSERT PORT AGE GREATER THAN 5 YRS 07/20/2023 Guilford Hicks, MD IMG VIR H&V [facility]    PR CLOSE ENTEROSTOMY,RESEC+ANAST N/A 10/09/2020    Procedure: ILEOSTOMY TAKEDOWN;  Surgeon: Timothy Suleman Sadiq, MD;  Location: OR [place];  Service: General Surgery    PR COLONOSCOPY W/BIOPSY SINGLE/MULTIPLE N/A 10/27/2012    Procedure: COLONOSCOPY,  FLEXIBLE, PROXIMAL TO SPLENIC FLEXURE; WITH BIOPSY, SINGLE OR MULTIPLE;  Surgeon: Annalee LABOR Mir, MD;  Location: PEDS PROCEDURE ROOM [facility];  Service: Gastroenterology    PR COLONOSCOPY W/BIOPSY SINGLE/MULTIPLE N/A 09/14/2013    Procedure: COLONOSCOPY, FLEXIBLE, PROXIMAL TO SPLENIC FLEXURE; WITH BIOPSY, SINGLE OR MULTIPLE;  Surgeon: Annalee LABOR Mir, MD;  Location: PEDS PROCEDURE ROOM [facility];  Service: Gastroenterology    PR COLONOSCOPY W/BIOPSY SINGLE/MULTIPLE N/A 11/08/2014    Procedure: COLONOSCOPY, FLEXIBLE, PROXIMAL TO SPLENIC FLEXURE; WITH BIOPSY, SINGLE OR MULTIPLE;  Surgeon: Annalee Dine Mir, MD;  Location: PEDS PROCEDURE ROOM [facility];  Service: Gastroenterology    PR COLONOSCOPY W/BIOPSY SINGLE/MULTIPLE N/A 12/26/2015    Procedure: COLONOSCOPY, FLEXIBLE, PROXIMAL TO SPLENIC FLEXURE; WITH BIOPSY, SINGLE OR MULTIPLE;  Surgeon: Annalee Dine Mir, MD;  Location: PEDS PROCEDURE ROOM [facility];  Service: Gastroenterology    PR COLONOSCOPY W/BIOPSY SINGLE/MULTIPLE N/A 09/02/2017    Procedure: COLONOSCOPY, FLEXIBLE, PROXIMAL TO SPLENIC FLEXURE; WITH BIOPSY, SINGLE OR MULTIPLE;  Surgeon: Annalee Dine Mir, MD;  Location: PEDS PROCEDURE ROOM [place];  Service: Gastroenterology    PR COLSC FLX W/REMOVAL LESION BY HOT BX FORCEPS N/A 08/27/2016    Procedure: COLONOSCOPY, FLEXIBLE, PROXIMAL TO SPLENIC FLEXURE; W/REMOVAL TUMOR/POLYP/OTHER LESION, HOT BX FORCEP/CAUTE;  Surgeon: Annalee Dine Mir, MD;  Location: PEDS PROCEDURE ROOM [facility];  Service: Gastroenterology    PR COLSC FLX W/RMVL OF TUMOR POLYP LESION SNARE TQ N/A 02/25/2019    Procedure: COLONOSCOPY FLEX; W/REMOV TUMOR/LES BY SNARE;  Surgeon: Lamar Jayson Ards, MD;  [facility] [place] [facility];  Service: Gastroenterology    PR COLSC FLX W/RMVL OF TUMOR POLYP LESION SNARE TQ N/A 03/13/2020    Procedure: COLONOSCOPY FLEX; W/REMOV TUMOR/LES BY SNARE;  Surgeon: Lamar Jayson Ards, MD;  Location: [facility] [place] [facility];  Service: Gastroenterology    PR ENDOSCOPY UPPER SMALL INTESTINE N/A 07/15/2023    Procedure: SMALL INTESTINAL ENDOSCOPY, ENTEROSCOPY BEYOND SECOND PORTION OF DUODENUM, NOT INCL ILEUM; DX, INCL COLLECTION OF SPECIMEN(S) BY BRUSHING OR WASHING, WHEN PERFORMED;  Surgeon: Charlanne Kipper, MD;  Location: [facility]URES [facility];  Service: Gastrointestinal    PR EXC SKIN BENIG 2.1-3 CM TRUNK,ARM,LEG Right 02/25/2017    Procedure: EXCISION, BENIGN LESION INCLUDE MARGINS, EXCEPT SKIN TAG, LEGS; EXCISED DIAMETER 2.1 TO 3.0 CM;  Surgeon: Jeyhan Suzan Wood, MD;  Location: CHILDRENS OR [facility];  Service: Plastics    PR EXC SKIN BENIG 3.1-4 CM TRUNK,ARM,LEG Right 02/25/2017    Procedure: EXCISION, BENIGN LESION INCLUDE MARGINS, EXCEPT SKIN TAG, ARMS; EXCISED DIAMETER 3.1 TO 4.0 CM;  Surgeon: Jeyhan Suzan Wood, MD;  Location: CHILDRENS OR [facility];  Service: Plastics    PR EXC SKIN BENIG >4 CM FACE,FACIAL Right 02/25/2017    Procedure: EXCISION, OTHER BENIGN LES INCLUD MARGIN, FACE/EARS/EYELIDS/NOSE/LIPS/MUCOUS MEMBRANE; EXCISED DIAM >4.0 CM;  Surgeon: Jeyhan Suzan Wood, MD;  Location: CHILDRENS OR [facility];  Service: Plastics    PR EXC TUMOR SOFT TISSUE LEG/ANKLE SUBQ 3+CM Right 08/05/2019    Procedure: EXCISION, TUMOR, SOFT TISSUE OF LEG OR ANKLE AREA, SUBCUTANEOUS; 3 CM OR GREATER;  Surgeon: Delon Charmaine Myron, MD;  Location: MAIN OR [place];  Service: Plastics    PR EXC TUMOR SOFT TISSUE LEG/ANKLE SUBQ <3CM Right 08/05/2019    Procedure: EXCISION, TUMOR, SOFT TISSUE OF LEG OR ANKLE AREA, SUBCUTANEOUS; LESS THAN 3 CM;  Surgeon: Delon Charmaine Myron, MD;  Location: MAIN OR [facility];  Service: Plastics    PR LAP, SURG PROCTECTOMY W J-POUCH N/A 08/10/2020    Procedure: ROBOTIC ASSISTED LAPAROSCOPIC PROCTOCOLECTOMY, ILEAL J  POUCH, WITH OSTOMY;  Surgeon: Evalene Karalee Shilling, MD;  Location: OR [place];  Service: General Surgery    PR NDSC EVAL INTSTINAL POUCH DX W/COLLJ SPEC SPX N/A 01/23/2021    Procedure: ENDO EVAL SM INTEST POUCH; DX;  Surgeon: Mikle Cleora Martinez, MD;  Location: [facility] PROCEDURES [place] [facility];  Service: Gastroenterology    PR NDSC EVAL INTSTINAL POUCH DX W/COLLJ SPEC SPX N/A 08/27/2021    Procedure: ENDO EVAL SM INTEST POUCH; DX;  Surgeon: Rodgers JAYSON Minor, MD;  Location: [facility]URES [facility];  Service: Gastroenterology    PR NDSC EVAL INTSTINAL POUCH DX W/COLLJ SPEC SPX N/A 12/09/2021    Procedure: ENDO EVAL SM INTEST POUCH; DX;  Surgeon: Sherrill Liliane Olmsted, MD;  Location: GI PROC[facility]URES [facility]  Service: Gastroenterology    PR NDSC EVAL INTSTINAL POUCH DX W/COLLJ [facility] SPX Left 04/09/2022    Procedure: ENDO EVAL SM INTEST POUCH; DX;  Surgeon: Martinez Mikle Cleora, MD;  Location: [facility] [place] [facility];  Service: Gastroenterology    PR NDSC EVAL INTSTINAL POUCH DX W/COLLJ SPEC SPX N/A 08/05/2022    Procedure: ENDO EVAL SM INTEST POUCH; DX;  Surgeon: Martinez Mikle Cleora, MD;  Location: GI PROCEDURES [facility];  Service: Gastroenterology    PR NDSC EVAL INTSTINAL POUCH DX W/COLLJ SPEC SPX N/A 03/13/2023    Procedure: ENDO EVAL SM INTEST POUCH; DX;  Surgeon: Rogerio Lauraine Sor, MD;  Location: GI PROCEDURES [facility] [place];  Service: Gastroenterology    PR NDSC EVAL INTSTINAL POUCH DX W/COLLJ SPEC SPX N/A 07/06/2023    Procedure: ENDO EVAL SM INTEST POUCH; DX;  Surgeon: Junette Fonda CROME, MD;  Location: GI PROCEDURES [facility];  Service: Gastroenterology    PR NDSC EVAL INTSTINAL POUCH DX W/COLLJ SPEC SPX N/A 10/14/2023    Procedure: ENDO EVAL SM INTEST POUCH; DX;  Surgeon: Brenita Vannie BIRCH, MD;  Location: GI PROCEDURES [facility];  Service: Gastroenterology    PR NDSC EVAL INTSTINAL POUCH W/BX SINGLE/MULTIPLE N/A 01/20/2022    Procedure: ENDOSCOPIC EVAL OF SMALL INTESTINAL POUCH; DIAGNOSTIC, No biopsies;  Surgeon: Gwenn Dallas Ruth, MD;  Location: GI PROCEDURES [facility];  Service: Gastroenterology    PR NDSC EVAL INTSTINAL POUCH W/BX SINGLE/MULTIPLE N/A 02/13/2022    Procedure: ENDOSCOPIC EVAL OF SMALL INTESTINAL POUCH; DIAGNOSTIC, WITH BIOPSY;  Surgeon: Georgine Arlean Min, MD;  Location: GI PROCEDURES [facility];  Service: Gastroenterology    PR NDSC EVAL INTSTINAL POUCH W/BX SINGLE/MULTIPLE N/A 03/13/2023    Procedure: ENDOSCOPIC EVAL OF SMALL INTESTINAL POUCH; DIAGNOSTIC, WITH BIOPSY;  Surgeon: Rogerio Lauraine Sor, MD;  Location: GI PROCEDURES [facility];  Service: Gastroenterology    PR NDSC EVAL INTSTINAL POUCH W/BX SINGLE/MULTIPLE N/A 01/29/2024    Procedure: ENDOSCOPIC EVAL OF SMALL INTESTINAL POUCH; DIAGNOSTIC, WITH BIOPSY;  Surgeon: Minor Rodgers JAYSON, MD;  Location: GI PROCEDURES [facility];  Service: Gastroenterology    PR UNLISTED PROCEDURE SMALL INTESTINE  01/23/2021    Procedure: UNLISTED PROCEDURE, SMALL INTESTINE;  Surgeon: Mikle Cleora Martinez, MD;  Location: [facility] [place] [facility];  Service: Gastroenterology    PR UNLISTED PROCEDURE SMALL INTESTINE  02/13/2022    Procedure: UNLISTED PROCEDURE, SMALL INTESTINE;  Surgeon: Georgine Arlean Min, MD;  Location: GI PROCEDURES [facility];  Service: Gastroenterology    PR UPPER GI ENDOSCOPY,BIOPSY N/A 10/27/2012    Procedure: UGI ENDOSCOPY; WITH BIOPSY, SINGLE OR MULTIPLE;  Surgeon: Annalee LABOR Mir, MD;  Location: PEDS PROCEDURE ROOM [facility];  Service: Gastroenterology    PR UPPER GI ENDOSCOPY,BIOPSY N/A 09/14/2013  Procedure: UGI ENDOSCOPY; WITH BIOPSY, SINGLE OR MULTIPLE;  Surgeon: Annalee LABOR Mir, MD;  Location: PEDS PROCEDURE ROOM [facility];  Service: Gastroenterology    PR UPPER GI ENDOSCOPY,BIOPSY N/A 11/08/2014    Procedure: UGI ENDOSCOPY; WITH BIOPSY, SINGLE OR MULTIPLE;  Surgeon: Annalee Dine Mir, MD;  Location: PEDS PROCEDURE ROOM [facility];  Service: Gastroenterology    PR UPPER GI ENDOSCOPY,BIOPSY N/A 12/26/2015    Procedure: UGI ENDOSCOPY; WITH BIOPSY, SINGLE OR MULTIPLE;  Surgeon: Annalee Dine Mir, MD;  Location: PEDS PROCEDURE ROOM [facility];  Service: Gastroenterology    PR UPPER GI ENDOSCOPY,BIOPSY N/A 08/27/2016    Procedure: UGI ENDOSCOPY; WITH BIOPSY, SINGLE OR MULTIPLE;  Surgeon: Annalee Dine Mir, MD;  Location: PEDS PROCEDURE ROOM [facility];  Service: Gastroenterology    PR UPPER GI ENDOSCOPY,BIOPSY N/A 09/02/2017    Procedure: UGI ENDOSCOPY; WITH BIOPSY, SINGLE OR MULTIPLE;  Surgeon: Annalee Dine Mir, MD;  Location: PEDS PROCEDURE ROOM [facility];  Service: Gastroenterology    PR UPPER GI ENDOSCOPY,BIOPSY N/A 03/13/2020    Procedure: UGI ENDOSCOPY; WITH BIOPSY, SINGLE OR MULTIPLE;  Surgeon: Lamar Jayson Ards, MD;  Location: [facility] [place] [facility];  Service: Gastroenterology    PR UPPER GI ENDOSCOPY,BIOPSY N/A 09/05/2021    Procedure: UGI ENDOSCOPY; WITH BIOPSY, SINGLE OR MULTIPLE;  Surgeon: Rosamond Never, MD;  Location: GI PROCEDURES [facility];  Service: Gastroenterology    PR UPPER GI ENDOSCOPY,DIAGNOSIS N/A 01/20/2022    Procedure: UGI ENDO, INCLUDE ESOPHAGUS, STOMACH, & DUODENUM &/OR JEJUNUM; DX W/WO COLLECTION SPECIMN, BY BRUSH OR WASH;  Surgeon: Gwenn Dallas Ruth, MD;  Location: GI PROCEDURES [facility];  Service: Gastroenterology    PR UPPER GI ENDOSCOPY,DIAGNOSIS N/A 01/29/2024    Procedure: UGI ENDO, INCLUDE ESOPHAGUS, STOMACH, & DUODENUM &/OR JEJUNUM; DX W/WO COLLECTION SPECIMN, BY BRUSH OR WASH;  Surgeon: Filbert Rodgers BROCKS, MD;  Location: GI PROCEDURES [facility];  Service: Gastroenterology    TUMOR REMOVAL      multiple-head, neck, back, hand, right flank, multiple   [3]   Family History  Problem Relation Age of Onset    No Known Problems Mother     No Known Problems Father     No Known Problems Sister     No Known Problems Brother     Stroke Maternal Grandmother     Other Maternal Grandmother         benign lesions of liver and pancreas, further details unknown    Cancer Maternal Grandmother     Diabetes Maternal Grandmother Hypertension Maternal Grandmother     Thyroid disease Maternal Grandmother     Arthritis Maternal Grandfather     Asthma Maternal Grandfather     COPD Paternal Grandmother         Deceased    Miscarriages / Stillbirths Paternal Grandmother     Alcohol abuse Paternal Grandfather         Deceased    No Known Problems Maternal Aunt     No Known Problems Maternal Uncle     No Known Problems Paternal Aunt     No Known Problems Paternal Uncle     Anesthesia problems Neg Hx     Broken bones Neg Hx     Cancer Neg Hx     Clotting disorder Neg Hx     Collagen disease Neg Hx     Diabetes Neg Hx     Dislocations Neg Hx     Fibromyalgia Neg Hx     Gout Neg Hx     Hemophilia  Neg Hx     Osteoporosis Neg Hx     Rheumatologic disease Neg Hx     Scoliosis Neg Hx     Severe sprains Neg Hx     Sickle cell anemia Neg Hx     Spinal Compression Fracture Neg Hx     Melanoma Neg Hx     Basal cell carcinoma Neg Hx     Squamous cell carcinoma Neg Hx     Breast cancer Neg Hx    [4]   Social History  Socioeconomic History    Marital status: Single    Number of children: 0    Years of education: 15    Highest education level: Some college, no degree   Tobacco Use    Smoking status: Never     Passive exposure: Past    Smokeless tobacco: Never   Vaping Use    Vaping status: Never Used   Substance and Sexual Activity    Alcohol use: Never    Drug use: Never    Sexual activity: Never   Other Topics Concern    Do you use sunscreen? Yes    Tanning bed use? No    Are you easily burned? No    Excessive sun exposure? No    Blistering sunburns? 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     Social Drivers of Health     Food Insecurity: No Food Insecurity (01/28/2024)    Hunger Vital Sign     Worried About Running Out of Food in the Last Year: Never true     Ran Out of Food in the Last Year: Never true   Tobacco Use: Low Risk (01/28/2024)    Patient History Smoking Tobacco Use: Never     Smokeless Tobacco Use: Never     Passive Exposure: Past   Transportation Needs: No Transportation Needs (01/28/2024)    PRAPARE - Transportation     Lack of Transportation (Medical): No     Lack of Transportation (Non-Medical): No   Alcohol Use: Not At Risk (10/13/2023)    Alcohol Use     How often do you have a drink containing alcohol?: Never     How many drinks containing alcohol do you have on a typical day when you are drinking?: 1 - 2     How often do you have 5 or more drinks on one occasion?: Never   Housing: Low Risk (01/28/2024)    Housing     Within the past 12 months, have you ever stayed: outside, in a car, in a tent, in an overnight shelter, or temporarily in someone else's home (i.e. couch-surfing)?: No     Are you worried about losing your housing?: No   Utilities: Not At Risk (10/27/2023)    Received from [facility] Utilities     In the past 12 months has the electric, gas, oil, or water company threatened to shut off services in your home?: No   Interpersonal Safety: Not At Risk (01/29/2024)    Interpersonal Safety     Unsafe Where You Currently Live: No     Physically Hurt by Anyone: No     Abused by Anyone: No   Financial Resource Strain: Low Risk (01/28/2024)    Overall Financial Resource Strain (CARDIA)     Difficulty of Paying Living Expenses: Not very  hard   Health Literacy: Low Risk (04/11/2023)    Health Literacy     : Never   [5]    acetaminophen   1,000 mg Oral Q8H    buprenorphine   1 patch Transdermal Q7 Days    diphenhydrAMINE   50 mg Intravenous Once    diphenoxylate -atropine   1 tablet Oral QID    escitalopram  oxalate  5 mg Oral Daily    famotidine   20 mg Oral Nightly    hydrocortisone   10 mg Oral Q AM    oxyCODONE   20 mg Oral Q4H    pantoprazole   40 mg Oral BID AC    scopolamine   1 patch Topical Q72H   [6]    lactated Ringers  Stopped (01/29/24 1301)   [7] acetaminophen , baclofen , bismuth  subsalicylate, butalbital -acetaminophen -caffeine , calcium carbonate, cetirizine , diphenhydrAMINE , [CANCELED] Implement **AND** [EXPIRED] Care order/instruction **AND** [EXPIRED] Vital signs **AND** Adult Oxygen  therapy **AND** [EXPIRED] sodium chloride  **AND** [EXPIRED] sodium chloride  0.9% **AND** [EXPIRED] diphenhydrAMINE  **AND** famotidine  (PEPCID ) IV **AND** methylPREDNISolone  sodium succinate  **AND** [EXPIRED] EPINEPHrine , fluticasone  propionate, guaiFENesin , HYDROmorphone , magic mouthwash oral **AND** lidocaine , loperamide , LORazepam , melatonin, ondansetron , promethazine , simethicone , zinc  oxide-cod liver oil

## 2024-02-17 NOTE — Plan of Care (Signed)
 Shift Summary  Multiple doses of HYDROmorphone  (PF) were given for persistent abdominal pain, with pain scores decreasing slightly overnight.   DiphenhydrAMINE  and cetirizine  were administered several times for allergy  symptoms and rash, with provider notified but no new orders given.   Ondansetron  was given for nausea, and diphenoxylate -atropine  was refused in the morning.   Patient maintained independence in oral care and mobility, and nutrition was adequate.   Overall, pain and allergy  symptoms required repeated interventions, but patient remained able to perform daily activities.   Pt finished iron , pt has continued to receive benadryl  for rash. This am, pt face was swollen. Np notified and will give solumdedol and pepcid     Optimal Pain Control and Function: Abdominal pain remained constant and aching throughout the shift, with pain scores slightly decreasing from 9 to 8 after multiple doses of HYDROmorphone  (PF); pain was present bilaterally and occasionally interrupted sleep, but patient sometimes declined intervention.     Nausea and Vomiting Relief: Ondansetron  was administered for nausea late in the shift, and no further documentation of nausea or vomiting was noted after administration.     Improved Ability to Complete Activities of Daily Living: Patient maintained independence with oral care and mobility, walked occasionally, and nutrition was documented as adequate.

## 2024-02-17 NOTE — Progress Notes (Signed)
 Daily Progress Note    Assessment/Plan:    [name] [name] is a 23 y.o. 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    Acute on chronic LGI bleeding - Iron  Deficiency: Increased bleeding and pain for the past ~two weeks in the setting of prior colectomy and ileoanal anastamosis as below. Has had prior ulcers affecting her J pouch and history of prior GIBs. EGD and pouchoscopy 11/21 noted multiple (30-50) gastric polyps, no specimens collected and no evidence of pouchitis.  Biopsy did reveal adenoma (not high grade) that will need resection. She continues to have frequent BMs (>10/day) and Hgb has remained stable in 9s - 10. She has reported worsening abdominal pain/sense of distention and pulling lower abd and now epigastric and right flank  -With ongoing bleeding, discussed transfer to [facility] for double balloon endoscopy - [facility] declined on 12/1 and requests outpatient follow up instead end of December  -Continue to monitor for bleeding in house, per our GI team no further endoscopy warranted, I am concerned there is intermittent ischemia. Evaluating the CT enteroscopy with Radiology today 12/9, narrowing of SMA significantly and concerned she may have intermittent ischemic bowel after larger meals and then bleed slightly from mucosa  -Transfuse for Hgb >7   -IV Zofran  4 mg Q6hr PRN, scopolamine  patch, and prn PO Phenergan   -Trialing Lomitil to help with bowel frequency    - Asked GI 12/4 if any additional recs - could switch to trial of Imodium , but she would prefer to continue Lomotil     - Has prn Imodium  ordered  -CT enteroscopy 12/6   Interval change from 01/27/2024   -Grossly similar size of the ill-defined central mesenteric mass and right inferior rectus intramuscular masses.     -New mild asymmetric thickening of the small bowel leading into the ileoanal anastomosis which may be exaggerated due to under distention. There are a few additional mildly thickened small bowel loops in the lower abdomen and pelvis which could be seen with enteritis. No evidence of bowel obstruction.   -Additional chronic and incidental findings as described in the body of the report.     Syncope  - 12/8 likely from volume depletion, gave 1 L IV she is dizzy when she stands up but improved after fluids, she got more fluids post reaction of iron  infusion    Iron  def anemia from chronic blood loss  -S/p IV iron  inpatient. This was complicated by hives needing treatment with steroids and antihistamines. Discussed recommendation to discontinue IV Benadryl , but she has been on this for a long time for sense of throat swelling/face swelling after iron  infusion and has not tolerated PO Benadryl  in the past. Will continue to work on weaning this.  - checking iron  down to 45  - repeating pretreatment and Iron  infusion with iron  dextran appreciate Heme rec for pretreat   1gm IV iron  per usual regimen with premedications:  - cetirizine  20 mg twice daily for 48 hours prior to infusion and at least 72 hours after;   - prednisone  50 mg x 2 doses at 13 and 7 hours prior to infusion   then methylprednisolone  40 mg IV immediately prior to infusion;   - diphenhydramine  50 mg IV, famotidine  40 mg IV 30 minutes prior to infusion.   --> Then give iron  dextran 1 g  Will give post infusion IV NS bolus and pre infusion IV NS bolus  - Had reaction overnight  after infusion was worse than the prior iron  infusion.  Required IV steroids IV Benadryl  and was still flaring during the day today.  Scheduled twice daily 20 mg Solu-Medrol , and increase the frequency of IV Benadryl  available.    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 Most recently trialed on nirogacestat , though recently, this has been held due to diarrhea and bleeding.  -Oncology aware of admission  -Breast US  obtained on 11/22 and did not show evidence of fluid collections or infection, but a 'full diagnostic evaluation' would need to be considered via Breast Imaging Department as an outpatient  -Oncology recommended a breast MRI to evaluate  -Obtained US  of abdomen 12/1 to ensure no changes to rectus sheath tumor/bleeding given ongoing pain. This showed no significant change from last CT scan.   - follow up with CTA to evaluate SMA and mesentery as dermoid tumors present     Acute on Chronic Pain: Follows with [facility]. Per pain plan:  -Continue home Butrans  patch  -Oxycodone  20mg  Q4hr PRN, IV Dilaudid  1 mg Q4hr PRN   -Discussed recommendation to spread out Dilaudid  dosing - she feels that she is in worse pain now than she has been though  -Baclofen  5mg  TID     Mild adrenal insufficiency: Tentative diagnosis during last admission  -Continue Hydrocortisone  10mg  daily, consider stress dosing if hypotensive     GERD:  -Continue home famotidine  20 mg nightly    Poor PO Intake  -RD consulted, saw patient again 12/4  -Supplements   - encouraging PO hydration      Depression/Anxiety:  -Appreciate Psychology recs  -Continue home Lexapro  5 mg daily   -Continue home Ativan  0.5 mg PRN      Disposition: Home   DVT Prophylaxis: encourage ambulation  Diet: Regular        I personally spent greater than 55 minutes face-to-face and non-face-to-face in the care of this patient, which includes all pre, intra, and post visit time on the date of service.  All documented time was specific to the E/M visit and does not include any procedures that may have been performed.  __________________________________________________________________    Subjective:    She had a significant reaction overnight requiring IV Solu-Medrol  and IV Benadryl  with raspy voice facial swelling lip swelling diffuse rash, due to this going on for a while and a delayed response decided to go ahead and treat her with PID steroids as well as continue IV Benadryl  as needed.      Patient still having significant abdominal pain and burning with stooling still stooling over 20 times a day, some blood in it, we discussed getting the MRA to evaluate the blood flow to the mesentery due to the thinness on the other CT to make sure there is not mesenteric ischemia albeit this may be dynamic.  We only touched on the idea of possible bowel rest, now that oncology has seen the patient and has come to a similar consideration, will need to discuss further with patient and family of maybe this approach.  That way if the bleeding and pain stop treatment with the other chemotherapy could proceed to see if the desmoid tumors would reduce.    Objective    Temp:  [36 ??C (96.8 ??F)-37 ??C (98.6 ??F)] 36.8 ??C (98.2 ??F)  Pulse:  [71-110] 81  Resp:  [14-18] 18  BP: (93-120)/(52-73) 113/61  SpO2:  [  92 %-100 %] 99 %    Young woman sitting upright in bed  Regular rate  Normal WOB on RA, lung sounds clear  Abdomen with hyperactive bowel sounds (stable),  diffuse tenderness especially over R flankepigastric  lower abd, some movement in lower abd almost pulling around prior stoma scar  Extremities warm

## 2024-02-18 LAB — CBC
HEMATOCRIT: 28.7 % — ABNORMAL LOW (ref 34.0–44.0)
HEMOGLOBIN: 9.7 g/dL — ABNORMAL LOW (ref 11.3–14.9)
MEAN CORPUSCULAR HEMOGLOBIN CONC: 33.9 g/dL (ref 32.0–36.0)
MEAN CORPUSCULAR HEMOGLOBIN: 28.5 pg (ref 25.9–32.4)
MEAN CORPUSCULAR VOLUME: 84.2 fL (ref 77.6–95.7)
MEAN PLATELET VOLUME: 9.7 fL (ref 6.8–10.7)
PLATELET COUNT: 217 10*9/L (ref 150–450)
RED BLOOD CELL COUNT: 3.41 10*12/L — ABNORMAL LOW (ref 3.95–5.13)
RED CELL DISTRIBUTION WIDTH: 18.3 % — ABNORMAL HIGH (ref 12.2–15.2)
WBC ADJUSTED: 11.2 10*9/L (ref 3.6–11.2)

## 2024-02-18 MED ADMIN — acetaminophen (TYLENOL) tablet 1,000 mg: 1000 mg | ORAL | @ 13:00:00

## 2024-02-18 MED ADMIN — acetaminophen (TYLENOL) tablet 1,000 mg: 1000 mg | ORAL | @ 22:00:00

## 2024-02-18 MED ADMIN — acetaminophen (TYLENOL) tablet 1,000 mg: 1000 mg | ORAL | @ 07:00:00

## 2024-02-18 MED ADMIN — pantoprazole (Protonix) EC tablet 40 mg: 40 mg | ORAL | @ 22:00:00

## 2024-02-18 MED ADMIN — pantoprazole (Protonix) EC tablet 40 mg: 40 mg | ORAL | @ 13:00:00

## 2024-02-18 MED ADMIN — diphenhydrAMINE (BENADRYL) injection: 25 mg | INTRAVENOUS | @ 14:00:00

## 2024-02-18 MED ADMIN — diphenhydrAMINE (BENADRYL) injection: 25 mg | INTRAVENOUS | @ 07:00:00

## 2024-02-18 MED ADMIN — diphenhydrAMINE (BENADRYL) injection: 25 mg | INTRAVENOUS | @ 03:00:00

## 2024-02-18 MED ADMIN — diphenhydrAMINE (BENADRYL) injection: 25 mg | INTRAVENOUS | @ 22:00:00

## 2024-02-18 MED ADMIN — diphenhydrAMINE (BENADRYL) injection: 25 mg | INTRAVENOUS | @ 18:00:00

## 2024-02-18 MED ADMIN — HYDROmorphone (PF) (DILAUDID) injection 1 mg: 1 mg | INTRAVENOUS | @ 04:00:00 | Stop: 2024-02-18

## 2024-02-18 MED ADMIN — HYDROmorphone (PF) (DILAUDID) injection 1 mg: 1 mg | INTRAVENOUS | @ 20:00:00 | Stop: 2024-02-21

## 2024-02-18 MED ADMIN — HYDROmorphone (PF) (DILAUDID) injection 1 mg: 1 mg | INTRAVENOUS | Stop: 2024-02-21

## 2024-02-18 MED ADMIN — HYDROmorphone (PF) (DILAUDID) injection 1 mg: 1 mg | INTRAVENOUS | @ 15:00:00 | Stop: 2024-02-21

## 2024-02-18 MED ADMIN — HYDROmorphone (PF) (DILAUDID) injection 1 mg: 1 mg | INTRAVENOUS | @ 08:00:00 | Stop: 2024-02-21

## 2024-02-18 MED ADMIN — ondansetron (ZOFRAN) injection 4 mg: 4 mg | INTRAVENOUS | @ 18:00:00

## 2024-02-18 MED ADMIN — ondansetron (ZOFRAN) injection 4 mg: 4 mg | INTRAVENOUS | @ 03:00:00

## 2024-02-18 MED ADMIN — cetirizine (ZYRTEC) tablet 20 mg: 20 mg | ORAL | @ 06:00:00

## 2024-02-18 MED ADMIN — famotidine (PEPCID) tablet 20 mg: 20 mg | ORAL | @ 03:00:00

## 2024-02-18 MED ADMIN — oxyCODONE (ROXICODONE) immediate release tablet 20 mg: 20 mg | ORAL | Stop: 2024-02-20

## 2024-02-18 MED ADMIN — oxyCODONE (ROXICODONE) immediate release tablet 20 mg: 20 mg | ORAL | @ 09:00:00 | Stop: 2024-02-18

## 2024-02-18 MED ADMIN — oxyCODONE (ROXICODONE) immediate release tablet 20 mg: 20 mg | ORAL | @ 18:00:00 | Stop: 2024-02-18

## 2024-02-18 MED ADMIN — oxyCODONE (ROXICODONE) immediate release tablet 20 mg: 20 mg | ORAL | @ 13:00:00 | Stop: 2024-02-18

## 2024-02-18 MED ADMIN — oxyCODONE (ROXICODONE) immediate release tablet 20 mg: 20 mg | ORAL | @ 06:00:00 | Stop: 2024-02-18

## 2024-02-18 MED ADMIN — oxyCODONE (ROXICODONE) 5 mg/5 mL solution 20 mg: 20 mg | ORAL | @ 22:00:00 | Stop: 2024-02-23

## 2024-02-18 MED ADMIN — diphenoxylate-atropine (LOMOTIL) 2.5-0.025 mg per tablet 1 tablet: 1 | ORAL | @ 11:00:00

## 2024-02-18 MED ADMIN — escitalopram oxalate (LEXAPRO) tablet 5 mg: 5 mg | ORAL | @ 13:00:00

## 2024-02-18 MED ADMIN — loperamide (IMODIUM) capsule 2 mg: 2 mg | ORAL | @ 06:00:00

## 2024-02-18 MED ADMIN — buprenorphine 20 mcg/hour transdermal patch 1 patch: 1 | TRANSDERMAL | @ 07:00:00

## 2024-02-18 MED ADMIN — scopolamine (TRANSDERM-SCOP) 1 mg over 3 days topical patch 1 mg: 1 | TOPICAL | @ 07:00:00

## 2024-02-18 MED ADMIN — methylPREDNISolone sodium succinate (SOLU-Medrol) injection 40 mg: 40 mg | INTRAVENOUS | @ 11:00:00 | Stop: 2024-02-19

## 2024-02-18 MED ADMIN — methylPREDNISolone sodium succinate (SOLU-Medrol) injection 40 mg: 40 mg | INTRAVENOUS | @ 23:00:00 | Stop: 2024-02-19

## 2024-02-18 MED ADMIN — fluticasone propionate (FLONASE) 50 mcg/actuation nasal spray 2 spray: 2 | TOPICAL | @ 07:00:00

## 2024-02-18 MED ADMIN — iohexol (OMNIPAQUE) 350 mg iodine/mL solution 100 mL: 100 mL | INTRAVENOUS | @ 08:00:00 | Stop: 2024-02-18

## 2024-02-18 NOTE — Consults (Signed)
 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  The patient is seen in consultation at the request of Ria Drapete Dancel, MD ([facility])) for GI bleeding and need for TPN.    Assessment & Plan  Gastrointestinal bleeding   Intermittent gastrointestinal bleeding with episodes of hematochezia. Hgb has been stable. Differential includes bleeding from the ileal pouch or higher up in the gastrointestinal tract.  Last pouch exam on 11/21 with a rectal adenoma (referred to resection team), but otherwise normal.  EGD with numerous gastric polyps but otherwise normal with no signs of bleeding.  Double balloon endoscopy scheduled for 12/30 at [facility] for further evaluation.   - Proceed with double balloon endoscopy at [facility] on December 30th     Chronic abdominal pain - Need for TPN  Chronic abdominal pain with episodes of intermittent bowel obstruction. Pain exacerbated by bowel movements and associated with bloating. Previous imaging showed mildly thickened abdominal wall and pelvis, but no definitive cause identified. Desmoid tumors may contribute to bowel obstruction by acting as trigger points for bowel shifting. No evidence of ischemia or vessel obstruction by tumors. TPN not recommended due to potential adverse effects and since she is currently meeting her nutritional requirements orally.  - Continue to manage pain without the use of IV opioids  - Do not recommend starting TPN    Issues Impacting Complexity of Management:  -None    Recommendations discussed with the patient's primary team. We will sign-off at this time, please re-contact if additional questions or a new need for consultation arises.    For questions, contact the on-call fellow for the Gastroenterology ([facility]) Consult Service.    Subjective:     History of Present Illness  [name] [name] is a 23 year old female with [name] who presents with ongoing gastrointestinal bleeding and abdominal pain.    She has been experiencing significant gastrointestinal bleeding for approximately a year and a half, with episodes severe enough to require blood transfusions and iron  infusions. The bleeding is described as 'blood clot looking things' mixed with mucus and is not associated with dyspnea. Despite interventions, the bleeding persists, causing considerable distress and impacting her quality of life.    She has a history of intermittent bowel blockages, characterized by alternating between frequent bowel movements and periods of no bowel movements for several hours. This pattern has been a significant issue, contributing to her distress and affecting her daily activities.    She experiences syncopal episodes and severe abdominal pain, which have worsened since her last visit. The pain is severe and is highly affected by her current condition. A CTA was performed recently.    She is scheduled for a double balloon enteroscopy on December 30th to further investigate the source of her bleeding. She is frustrated with the delay in obtaining this procedure and the ongoing need for medical interventions without clear answers.      Objective:   Temp:  [36.8 ??C (98.2 ??F)-37 ??C (98.6 ??F)] 37 ??C (98.6 ??F)  Pulse:  [63-81] 71  Resp:  [17-18] 18  BP: (109-124)/(61-73) 114/65  SpO2:  [98 %-100 %] 100 %    Gen: WDWN female in NAD, answers questions appropriately  Abdomen: Soft, NTND, no rebound/guarding, no hepatosplenomegaly  Extremities: No edema in the BLEs    Pertinent Labs/Studies:  -I have reviewed the patient's labs from 02/18/24 which show stable Hgb, stable renal function (SCr, electrolytes), stable LFTs, and stable CRP    Recent Labs     02/17/24  0950 02/18/24  0122 HGB 9.8* 9.7*     Recent Labs     02/17/24  0950   CREATININE 0.45*     Recent Labs     02/17/24  0950   AST 15   ALT 11   ALKPHOS 52   BILITOT <0.2*     Recent Labs     02/17/24  0950   CRP <5.0

## 2024-02-18 NOTE — Progress Notes (Signed)
 Daily Progress Note    Assessment/Plan:    [name] [name] is a 23 y.o. 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    Acute on chronic LGI bleeding - Iron  Deficiency: Increased bleeding and pain for the past ~two weeks in the setting of prior colectomy and ileoanal anastamosis as below. Has had prior ulcers affecting her J pouch and history of prior GIBs. EGD and pouchoscopy 11/21 noted multiple (30-50) gastric polyps, no specimens collected and no evidence of pouchitis.  Biopsy did reveal adenoma (not high grade) that will need resection. She continues to have frequent BMs (>10/day) and Hgb has remained stable in 9s - 10. She has reported worsening abdominal pain/sense of distention and pulling lower abd and now epigastric and right flank. CT enterography 12/6 with mild asymmetric thickening of the small bowel leading into the ileoanal anastomosis which may be exaggerated due to under distention and a few additional mildly thickened small bowel loops in the lower abdomen and pelvis which could be seen with enteritis. CTA on 12/11 with normal SMA and IMA. Moderate stenosis of celiac trunk likely a red herring.  - With ongoing bleeding, discussed transfer to [facility] for double balloon endoscopy - [facility] declined on 12/1 and requests outpatient follow up instead end of December  - Transfuse for Hgb <7   - IV ondansetron  4 mg Q6hr PRN, scopolamine  patch, and prn PO promethazine   - continue diphenoxylate -atropine  (Lomotil ) qid with prn loperamide  ordered  - Multidisciplinary discussion with GI (inpatient and outpatient providers) and oncology regarding TPN for bowel rest. This has been attempted in the past without impact on bleeding or abdominal pain. Continue enteral feeding    Iron  def anemia from chronic blood loss  S/p IV iron  dextran on 11/22 and 12/9. She has reactions to iron . 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 Loris. Most recently trialed on nirogacestat , though recently, this has been held due to diarrhea and bleeding.  - Oncology aware of admission but primary oncology is out. Will need to discuss nirogacestat , doxil, or sorafenib  when he returns  - Breast US  obtained on 11/22 and did not show evidence of fluid collections or infection, but a 'full diagnostic evaluation' would need to be considered via Breast Imaging Department as an outpatient  - Oncology recommended a breast MRI to evaluate     Acute on Chronic Pain: Follows with [facility], primary is [name] [name], [name] [name]. Per pain plan:  - Continue home Butrans  20 mcg/hr patch q7 days  - Oxycodone  20 mg Q4hr PRN (will switch to liquid today, 12/11), IV hydromorphone  1 mg Q4hr PRN  - Baclofen  5mg  TID  - Will discuss with Dr. Daring tomorrow, 12/12     Mild adrenal insufficiency: Tentative diagnosis during last admission  - Currently on methylpred 40 mg IV q12 h post iron   - Restart hydrocortisone  10 mg daily on 12/12     GERD:  - Continue home famotidine  20 mg nightly  - Continue pantoprazole  40 mg p.o. bid    Poor PO Intake  - RD involved continue to encourage PO intake and supplementation     Depression/Anxiety:  - Appreciate Psychology recs  - Continue home Lexapro  5 mg daily   -  Continue home Ativan  0.5 mg PRN  - Palliative care consult for psychosocial support inpatient and outpatient    Disposition: Home   DVT Prophylaxis: encourage ambulation  Diet: Regular     Issues Impacting Complexity of Management:  -Parenteral controlled medications: IV Dilaudid     Medical Decision Making: Discussed the patient's management and/or test interpretation with inpatient GI consult service who reached out to outpt GI provider Dr. Mikle Carter, PN team (including Dr. Arlean [name]), inpatient oncology consult attending Dr. Marty [name], palliative care consult team, and primary outpatient pain physican as summarized within this note    I personally spent greater than 75 minutes face-to-face and non-face-to-face in the care of this patient, which includes all pre, intra, and post visit time on the date of service.  All documented time was specific to the E/M visit and does not include any procedures that may have been performed.    [name] [name], MD  Professor of Internal Medicine and Pediatrics  February 18, 2024 7:48 PM     __________________________________________________________________    Subjective:  Still having frequent loose, bloody stools and abdominal pain. Tearful and frustrated when discussing CTA results likely not being the cause of her pain and bleeding.     Objective    Temp:  [36.8 ??C (98.2 ??F)-37 ??C (98.6 ??F)] 37 ??C (98.6 ??F)  Pulse:  [62-81] 62  Resp:  [18] 18  BP: (107-124)/(56-73) 107/56  SpO2:  [98 %-100 %] 98 %    Unchanged on 12/11  Young woman sitting upright in bed  Regular rate  Normal WOB on RA, lung sounds clear  Abdomen with hyperactive bowel sounds (stable),  diffuse tenderness especially over R flankepigastric  lower abd, some movement in lower abd almost pulling around prior stoma scar  Extremities warm

## 2024-02-18 NOTE — Consults (Signed)
 Oncology follow up note:   I attempted to follow up with the patient- but she was in music therapy. Have discussed with radiology and primary team.     The CT angio is reassuring for patency of SMA, so mesenteric ischemia does not look to be the cause. The celiac findigs are unlikely to be related to her symptoms, as this is a common radiographic finding typically with no functional consequence.     Thus, it seems her pain and difficulty eating are most likely from her desmoids. It's not clear why this would cause bleeding as it's not common feature of mesenteric fibromatosis, but, seems likely me to be related as her desmoids have been worsening over the course of time this as occurred. That said, as per GI note, the double push enteroscopy intention is to id the source and hopefully answer the question.     Appreciate GI thoughts re: TPN- I raised the issue given her progressive pain, poor po intake, and lack of any improvement despite an exceptionally thorough work up without clear means of improving her current status. If risks outweigh potential benefits I agree on holding off.  Hopefully she will be able to maintain her caloric intake which has been compromised per her report for some time. Will see what her calorie counts look like, and if she is able to eat better with better pain control     Desmoid-related plan tentatively to resume some desmoid-directed therapy. Will discuss with her primary oncologist when he returns from vacation in a few days.

## 2024-02-18 NOTE — Plan of Care (Addendum)
 Pt a/ox4, VSS on RA, up ad lib - ambulating in halls, tolerating well. Generalized swelling, itching, redness ongoing - PRNs given, see mar. Port access maintained with good blood return/dressing intact. Scheduled po pain meds transitioned to liquid for better absorption, PRN IV dilaudid  given, see mar. Voiding, pt did not endorse change to blood stools this shift - declined Lomotil  doses. Tolerating minimal po intake - lactacid, Pediasure, core drinks at bedside. PRN zofran  effective x1.    Therapeutic presence and compassionate listening provided. Pet therapy beneficial. Family supportive at bedside.    Shift Summary  Pain was frequently assessed and managed with PRN HYDROmorphone  and oxyCODONE , resulting in temporary pain relief .   Comfort interventions and PRN medications were provided for discomfort and nausea.   Safety measures including fall reduction and aspiration precautions were consistently implemented.   Nutritional intake remained poor despite some snack consumption.   Overall, pain and comfort were actively managed, safety was maintained, and nutritional intake was limited during the shift.     Absence of Hospital-Acquired Illness or Injury: Safety interventions such as fall reduction and aspiration precautions were consistently maintained throughout the shift, and the environment remained safe with side rails up and frequent visual checks; no new hospital-acquired issues were documented.     Optimal Comfort and Wellbeing: Comfort interventions including repositioning, distraction, and emotional support were provided, and PRN diphenhydrAMINE  and ondansetron  were administered; discomfort persisted but was intermittently reduced after interventions.     Optimal Pain Control and Function: Pain was described as constant and primarily discomfort or cramping, with pain scores fluctuating between 0 and 8; HYDROmorphone  and oxyCODONE  were administered PRN, resulting in temporary decreases in pain. Improved Nutritional Intake: Oral intake remained low with 0% of meals eaten and only 25% of a snack consumed, and nutrition was assessed as probably inadequate.

## 2024-02-18 NOTE — Plan of Care (Signed)
 Shift Summary  HYDROmorphone  was administered twice for persistent high pain, with no improvement in pain scores. Patient was observed sleeping after one of the administrations.  Multiple PRN medications were given for allergies, loose stools, and nausea, supporting comfort.    Family presence was maintained throughout the shift, contributing to emotional support.    Fall prevention interventions were consistently implemented, and no falls occurred.  MD was notified of blood per rectum, CBC was checked, patient remained stable and denied symptoms.  CTA was completed in the early morning hours of the shift, the patient tolerated well.  Both transdermal patches were removed and replaced with new ones, alternating sites, as ordered. Old patches were wasted in the pixus.  Overall, the patient remained in bed with stable temperature and no new hospital-acquired complications documented.     Absence of Hospital-Acquired Illness or Injury: No new hospital-acquired illnesses or injuries documented during the shift; safety interventions and environment modifications were consistently maintained. Temperature remained stable throughout the shift.     Optimal Comfort and Wellbeing: Comfort interventions included family presence and PRN medications for allergies and loose stools, but pain remained constant and high. Patient declined CHG treatment due to skin irritation.     Optimal Pain Control and Function: Pain scores increased from 8 to 9 and remained constant; HYDROmorphone  was administered twice during the shift. No improvement in pain was noted.     Absence of Fall and Fall-Related Injury: Fall prevention strategies such as frequent toileting, hourly visual checks, and environmental modifications were maintained throughout the shift, with no falls reported.     Skin Health and Integrity: Skin assessment revealed persistent rash scattered from iron  infusion reaction and redness; patient declined CHG treatment due to irritation. Braden Scale score remained high, indicating low risk for pressure injury.

## 2024-02-19 DIAGNOSIS — D48119 Desmoid tumor: Principal | ICD-10-CM

## 2024-02-19 MED ADMIN — acetaminophen (TYLENOL) tablet 1,000 mg: 1000 mg | ORAL | @ 06:00:00

## 2024-02-19 MED ADMIN — acetaminophen (TYLENOL) tablet 1,000 mg: 1000 mg | ORAL | @ 14:00:00

## 2024-02-19 MED ADMIN — acetaminophen (TYLENOL) tablet 1,000 mg: 1000 mg | ORAL | @ 23:00:00

## 2024-02-19 MED ADMIN — pantoprazole (Protonix) EC tablet 40 mg: 40 mg | ORAL | @ 14:00:00

## 2024-02-19 MED ADMIN — pantoprazole (Protonix) EC tablet 40 mg: 40 mg | ORAL | @ 23:00:00

## 2024-02-19 MED ADMIN — diphenhydrAMINE (BENADRYL) injection: 25 mg | INTRAVENOUS | @ 23:00:00

## 2024-02-19 MED ADMIN — diphenhydrAMINE (BENADRYL) injection: 25 mg | INTRAVENOUS | @ 02:00:00

## 2024-02-19 MED ADMIN — diphenhydrAMINE (BENADRYL) injection: 25 mg | INTRAVENOUS | @ 06:00:00

## 2024-02-19 MED ADMIN — diphenhydrAMINE (BENADRYL) injection: 25 mg | INTRAVENOUS | @ 18:00:00

## 2024-02-19 MED ADMIN — diphenhydrAMINE (BENADRYL) injection: 25 mg | INTRAVENOUS | @ 10:00:00

## 2024-02-19 MED ADMIN — diphenhydrAMINE (BENADRYL) injection: 25 mg | INTRAVENOUS | @ 14:00:00

## 2024-02-19 MED ADMIN — HYDROmorphone (PF) (DILAUDID) injection 1 mg: 1 mg | INTRAVENOUS | @ 04:00:00 | Stop: 2024-02-21

## 2024-02-19 MED ADMIN — HYDROmorphone (PF) (DILAUDID) injection 1 mg: 1 mg | INTRAVENOUS | @ 08:00:00 | Stop: 2024-02-19

## 2024-02-19 MED ADMIN — HYDROmorphone (PF) (DILAUDID) injection 1 mg: 1 mg | INTRAVENOUS | @ 12:00:00 | Stop: 2024-02-19

## 2024-02-19 MED ADMIN — HYDROmorphone (PF) (DILAUDID) injection 1 mg: 1 mg | INTRAVENOUS | @ 17:00:00 | Stop: 2024-02-19

## 2024-02-19 MED ADMIN — ondansetron (ZOFRAN) injection 4 mg: 4 mg | INTRAVENOUS | @ 12:00:00

## 2024-02-19 MED ADMIN — ondansetron (ZOFRAN) injection 4 mg: 4 mg | INTRAVENOUS | @ 02:00:00

## 2024-02-19 MED ADMIN — famotidine (PEPCID) tablet 20 mg: 20 mg | ORAL | @ 02:00:00

## 2024-02-19 MED ADMIN — oxyCODONE (ROXICODONE) 5 mg/5 mL solution 20 mg: 20 mg | ORAL | @ 10:00:00 | Stop: 2024-02-23

## 2024-02-19 MED ADMIN — oxyCODONE (ROXICODONE) 5 mg/5 mL solution 20 mg: 20 mg | ORAL | @ 23:00:00 | Stop: 2024-02-23

## 2024-02-19 MED ADMIN — oxyCODONE (ROXICODONE) 5 mg/5 mL solution 20 mg: 20 mg | ORAL | @ 18:00:00 | Stop: 2024-02-23

## 2024-02-19 MED ADMIN — oxyCODONE (ROXICODONE) 5 mg/5 mL solution 20 mg: 20 mg | ORAL | @ 02:00:00 | Stop: 2024-02-23

## 2024-02-19 MED ADMIN — oxyCODONE (ROXICODONE) 5 mg/5 mL solution 20 mg: 20 mg | ORAL | @ 06:00:00 | Stop: 2024-02-23

## 2024-02-19 MED ADMIN — oxyCODONE (ROXICODONE) 5 mg/5 mL solution 20 mg: 20 mg | ORAL | @ 14:00:00 | Stop: 2024-02-23

## 2024-02-19 MED ADMIN — escitalopram oxalate (LEXAPRO) tablet 5 mg: 5 mg | ORAL | @ 14:00:00

## 2024-02-19 MED ADMIN — HYDROmorphone (PF) (DILAUDID) injection 1 mg: 1 mg | INTRAVENOUS | Stop: 2024-02-24

## 2024-02-19 MED ADMIN — methylPREDNISolone sodium succinate (SOLU-Medrol) injection 20 mg: 20 mg | INTRAVENOUS | @ 23:00:00 | Stop: 2024-02-21

## 2024-02-19 MED ADMIN — methylPREDNISolone sodium succinate (SOLU-Medrol) injection 40 mg: 40 mg | INTRAVENOUS | @ 10:00:00 | Stop: 2024-02-19

## 2024-02-19 NOTE — Consults (Signed)
 Palliative Care Consult Note            Consultation from Requesting Attending Physician:  Dalbert Venancio [name], MD  Service Requesting Consult:  [facility])  Reason for Consult Request from Attending Physician:  Evaluation of Patient and Family Support  Primary Care Provider:  Job Lukes, [facility]  Primary Oncologist:Dr. Loris      Assessment/Plan:      SUMMARY:  This 23 y.o. patient is seriously and acutely ill due to history of IPAA for [name], with desmoid tumors. Illness is complicated by co-morbid acute and chronic conditions including acute on chronic pain, rectal bleeding, PTSD,  malnutrition on TPN. Palliative consulted  at the request of primary team for psychosocial support.       12/12 update:    -- Recardo Katina Stallion, Palliative Care LCSW, will follow for psychosocial support. See note for details.   -- Palliative care will defer symptom management to inpatient chronic pain team. Vinnie has a long-standing relationship with her outpatient chronic pain provider, Vinie Arvella Daring +  pain psychologist (plans to see her on 12/16).    -- Discussed updates and recommendations with primary team via Epic chat.       Izetta Kaiser, ELNITA  [facility]

## 2024-02-19 NOTE — Plan of Care (Signed)
 Shift Summary  Patient is alert and oriented ?? 4. On room air. Vital signs remain at baseline. Patient independent. Port site saline lock. Dressing clean, dry, and intact. Fall precautions maintained. Call bell within reach.  Pain remained severe and unchanged throughout the shift, with multiple PRN medications administered and interventions declined.  Bruising and rash on the abdomen were present, with no new skin concerns documented and skin protection measures maintained.  Ondansetron  was given twice for nausea, and no vomiting was reported; diphenoxylate -atropine  was refused.  Mobility was slightly limited, but nutrition and oral care were adequate, and frequent toileting and positioning support were provided.  Overall, comfort and pain control goals were not met, but skin integrity and ADL support were maintained.    Optimal Comfort and Wellbeing: Abdominal pain remained constant at a high level throughout the shift, with the patient repeatedly declining pain interventions; HYDROmorphone  was administered three times, but no improvement in pain scores was documented. DiphenhydrAMINE  was given for comfort, and ondansetron  was administered twice for nausea, but overall comfort did not improve.    Optimal Pain Control and Function: Pain was consistently rated 8/10 and described as aching or sharp in the abdomen, with no change in pain progression despite multiple PRN HYDROmorphone  doses and patient declining other interventions.    Skin Health and Integrity: Bruising and a rash were present on the abdomen at the start of the shift, with skin protection measures in place and adhesive use limited; no new skin issues were documented.    Nausea and Vomiting Relief: Ondansetron  was administered twice during the shift for nausea, and the patient did not report vomiting; diphenoxylate -atropine  was refused both times it was offered.    Improved Ability to Complete Activities of Daily Living: Mobility was slightly limited, but nutrition remained adequate and oral care was performed independently; frequent toileting and positioning support were provided throughout the shift.

## 2024-02-19 NOTE — Progress Notes (Signed)
 Daily Progress Note    Assessment/Plan:    [name] [name] is a 23 y.o. 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    Acute on chronic LGI bleeding - Iron  Deficiency: Increased bleeding and pain for the past ~two weeks in the setting of prior colectomy and ileoanal anastamosis as below. Has had prior ulcers affecting her J pouch and history of prior GIBs. EGD and pouchoscopy 11/21 noted multiple (30-50) gastric polyps, no specimens collected and no evidence of pouchitis.  Biopsy did reveal adenoma (not high grade) that will need resection. She continues to have frequent BMs (>10/day) and Hgb has remained stable in 9s - 10. She has reported worsening abdominal pain/sense of distention and pulling lower abd and now epigastric and right flank. CT enterography 12/6 with mild asymmetric thickening of the small bowel leading into the ileoanal anastomosis which may be exaggerated due to under distention and a few additional mildly thickened small bowel loops in the lower abdomen and pelvis which could be seen with enteritis. CTA on 12/11 with normal SMA and IMA. Moderate stenosis of celiac trunk likely a red herring.  - With ongoing bleeding, discussed transfer to [facility] for double balloon endoscopy - [facility] declined on 12/1 and requests outpatient follow up instead end of December  - Transfuse for Hgb <7   - IV ondansetron  4 mg Q6hr PRN, scopolamine  patch, and prn PO promethazine   - continue diphenoxylate -atropine  (Lomotil ) qid with prn loperamide  ordered  - Multidisciplinary discussion with GI (inpatient and outpatient providers) and oncology regarding TPN for bowel rest. This has been attempted in the past without impact on bleeding or abdominal pain. Continue enteral feeding    Iron  def anemia from chronic blood loss  S/p IV iron  dextran on 11/22 and 12/9. She has reactions to iron . See Epic flag for pre and post-treatment for iron  infusions  - Wean methypred to 20 mg IV BID. 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 Loris. Most recently trialed on nirogacestat , though recently, this has been held due to diarrhea and bleeding.  - Oncology aware of admission but primary oncology is out. Will need to discuss nirogacestat , doxil, or sorafenib  when he returns  - Breast US  obtained on 11/22 and did not show evidence of fluid collections or infection, but a 'full diagnostic evaluation' would need to be considered via Breast Imaging Department as an outpatient  - Oncology recommended a breast MRI to evaluate     Acute on Chronic Pain: Follows with [facility] management [facility], primary is Vinie Daring, [place]. Per pain plan:  - Continue home Butrans  20 mcg/hr patch q7 days  - Oxycodone  liquid 20 mg Q4hr PRN  - IV hydromorphone  1 mg to Q6hr PRN  - Baclofen  5mg  TID  - Inbox message to Dr. Daring on discharge for f/u     Mild adrenal insufficiency: Tentative diagnosis during last admission  - Decreased methylpred to 20 mg IV q12 h post iron   - Restart hydrocortisone  10 mg daily on discharge     GERD:  - Continue home famotidine  20 mg nightly  - Continue pantoprazole  40 mg p.o. bid    Poor PO Intake  - RD involved continue to encourage PO intake and supplementation  - Pt reports frequent outpatient IVF infusions 2/2 presyncopal symptoms. Will give 1  L LR overnight tonight     Depression/Anxiety:  - Appreciate Psychology recs  - Continue home Lexapro  5 mg daily   - Continue home lorazepam  0.5 mg PRN  - Palliative care consult for psychosocial support inpatient and outpatient    Disposition: Home   DVT Prophylaxis: encourage ambulation  Diet: Regular     Issues Impacting Complexity of Management:  -Parenteral controlled medications: IV Dilaudid     Medical Decision Making: Discussed the patient's management and/or test interpretation with primary pain physician as summarized within this note    I personally spent greater than 55 minutes face-to-face and non-face-to-face in the care of this patient, which includes all pre, intra, and post visit time on the date of service.  All documented time was specific to the E/M visit and does not include any procedures that may have been performed.    [name] [name], MD  Professor of Internal Medicine and Pediatrics  February 19, 2024 8:38 AM     __________________________________________________________________    Subjective:  Still reports facial and abdominal swelling. Would like to start weaning IV hydromophone in anticipation of possible d/c Sunday. Also discussed IV fluids overnight and decreasing IV methylpred as this could be contributing to swelling.     Objective    Temp:  [36.8 ??C (98.2 ??F)-37 ??C (98.6 ??F)] 36.8 ??C (98.2 ??F)  Pulse:  [58-72] 58  Resp:  [16-18] 16  BP: (107-116)/(56-66) 112/64  SpO2:  [97 %-100 %] 97 %    Unchanged on 12/12  Young woman sitting upright in bed  Regular rate  Normal WOB on RA, lung sounds clear  Abdomen diffuse tenderness and more fullness in upper abdomen  Extremities warm

## 2024-02-19 NOTE — Progress Notes (Signed)
 [name] [name] has been contacted in regards to their refill of Ogsiveo . At this time, they have declined refill due to medication being on hold. Refill assessment call date has been updated per the patient's request.    Note: Patient has been in the hospital and is unsure if they will continue this medication. They still have two bottles at home and requested a follow up call around the end of the month.

## 2024-02-19 NOTE — Consults (Addendum)
 [facility])   Consultation - Liaison (CL) Psychiatry  CL Psychology Follow-Up Note      Service Date:  02/19/24  Admit Date:  01/28/24  Clinician:  Olam Littles, PsyD  Intervention: 60 min Individual Psychotherapy  Face-to-Face Clinical Contact with Patient: 60 min  Pre-/Post-Encounter Activities: 15 min  Method of Interaction: In-Person    BACKGROUND INFORMATION AND REASON FOR REFERRAL:  Please see H&P for full details. 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 She was admitted to the Hospitalist service for management of the concerns above. Pt is well-known to clinical research associate from previous admissions. Please see prior notes for further details on psychosocial history. Psychology consulted to address psychological factors affecting Pt's medical condition.     Interval events:  -No adverse behavioral/psychiatric events since last seen by Psychology     ASSESSMENT  IMPRESSIONS/SUMMARY    At follow-up, Pt's mood appeared overall stable. She endorsed low mood and worry in the setting of ongoing uncertainty as to what is contributing to worsening GI bleeds. Session topics included cognitive and behavioral approaches to reduce anxiety and promote grounding and present-moment awareness.   Pt actively engaged and receptive to intervention.     DIAGNOSTIC IMPRESSIONS    Major depressive disorder, recurrent episode, moderate  Generalized anxiety disorder  Posttraumatic stress disorder    Risk Assessment:  ASQ screening result: no intervention is necessary    -A full risk assessment was previously performed on 11/21.  Risk assessment remains essentially unchanged.    Current suicide risk: low risk  Current homicide risk: low risk      PLAN  RECOMMENDATIONS    ## Safety and Observation Level:   -This patient is not currently under IVC. If safety concerns arise, please page psychiatry for an evaluation. Recommend routine level of observation per primary team.    ## Follow-up:  - The patient desires ongoing follow-up from CL Psychology while medically inpatient.    ## Disposition:  -There are no psychological contraindications to discharging this patient when medically appropriate.   -Pt interested in scheduling outpatient therapy appointment with writer. Appointment coordinated for 1/15 at 9AM.  - When this patient is discharged, please ensure that their AVS includes information about the 12 Suicide & Crisis Lifeline.    ## Behavioral/environmental:   -Please adhere to delirium (prevention) protocol.   -Efforts to encourage normal circadian function are recommended.  Specifically, exposure to natural light during the day, minimization of light at night, consistent/routine times for sleep/wake, and avoiding of daytime naps (if possible) may be helpful.   -Please consolidate care during the night when feasible.    Olam GORMAN Littles, PsyD  Clinical Psychologist       SUBJECTIVE  SESSION CONTENT     Session began with a brief check-in. Pt recounted events from the past 2 days, including results of CTA and treatment options that have been presented to her. Pt provided space to process complex emotions related to her medical situation, including gratitude for reassuring results and anxiety about undergoing double balloon endoscopy at [facility]. Pt engaged in exploration of salient values; Pt reported she is hopeful her medical situation will improve so she can have better quality of life. Pt shared that it is difficult not to get discouraged and worried amid ongoing uncertainty pertaining to her situation. Pt engaged in  discussion of cognitive and behavioral approaches to reduce anxiety and promote contact with the present moment.     OBJECTIVE   MENTAL STATUS     Orientation and consciousness: AOx4   Appearance: Appears stated age  Behavior: Calm, Cooperative, Direct eye contact, and Polite  Speech: Within normal limits  Language: Intact  Mood: Dysphoric  Affect: Full and Mood congruent  Perceptual disturbance (hallucinations, illusions): None  Thought process and association: Linear and coherent  Thought content (delusions, obsessions etc.): None  Insight: Fair  Judgement: Fair  Memory: WNL, although not formally assessed     EDUCATION/INTERVENTIONS:    -Completed mood check-in and reviewed the patient's current stressors and strategies to manage stress.  -Cognitive behavioral therapy  -Discussed the patient's treatment goals, revisited discussion of treatment options, and  explored goals of care.  -Continued to discuss pertinent findings with Pt's care team

## 2024-02-19 NOTE — Plan of Care (Addendum)
 Patient alert and oriented x4. VSS on RA. On tele and cont o2 with no calls. PRNs given as needed. Family by bedside. Up ad lib. No other complaints.     Shift Summary  HYDROmorphone  was administered twice during the shift, resulting in a reduction in abdominal pain score from 8 to 4.   Ondansetron  was given PRN for nausea early in the shift, with no further episodes documented.   DiphenhydrAMINE  was administered twice for itching, and diphenoxylate -atropine  was refused.   Skin assessment revealed bruising and rash, and pressure reduction interventions were implemented.   Patient remained awake and in bed during hourly checks, with family present and mobility slightly limited.     Optimal Comfort and Wellbeing: Abdominal pain remained constant and aching throughout most of the shift, with a decrease in pain score from 8 to 4 after HYDROmorphone  administration; patient declined other interventions and was able to rest with family present.     Optimal Pain Control and Function: Pain was chronic and ongoing, primarily in the abdomen, with pain scores at 8 until midday when it decreased to 4 after HYDROmorphone  was given; patient declined additional pain interventions.     Skin Health and Integrity: Bruising and rash were noted on skin assessment, and pressure reduction techniques and devices were utilized; Braden score was 20, and patient was positioned supine with frequent weight shifts encouraged.     Nausea and Vomiting Relief: Ondansetron  was administered PRN early in the shift, and no further documentation of nausea or vomiting was noted.     Improved Ability to Complete Activities of Daily Living: Mobility was slightly limited but patient walked occasionally and nutrition was adequate; family was present throughout the shift, and patient was awake and in bed during hourly checks.

## 2024-02-20 LAB — CBC
HEMATOCRIT: 30.7 % — ABNORMAL LOW (ref 34.0–44.0)
HEMOGLOBIN: 10.3 g/dL — ABNORMAL LOW (ref 11.3–14.9)
MEAN CORPUSCULAR HEMOGLOBIN CONC: 33.7 g/dL (ref 32.0–36.0)
MEAN CORPUSCULAR HEMOGLOBIN: 28.7 pg (ref 25.9–32.4)
MEAN CORPUSCULAR VOLUME: 85.2 fL (ref 77.6–95.7)
MEAN PLATELET VOLUME: 9.8 fL (ref 6.8–10.7)
PLATELET COUNT: 280 10*9/L (ref 150–450)
RED BLOOD CELL COUNT: 3.6 10*12/L — ABNORMAL LOW (ref 3.95–5.13)
RED CELL DISTRIBUTION WIDTH: 18.3 % — ABNORMAL HIGH (ref 12.2–15.2)
WBC ADJUSTED: 12.6 10*9/L — ABNORMAL HIGH (ref 3.6–11.2)

## 2024-02-20 MED ADMIN — acetaminophen (TYLENOL) tablet 1,000 mg: 1000 mg | ORAL | @ 15:00:00

## 2024-02-20 MED ADMIN — acetaminophen (TYLENOL) tablet 1,000 mg: 1000 mg | ORAL | @ 23:00:00

## 2024-02-20 MED ADMIN — acetaminophen (TYLENOL) tablet 1,000 mg: 1000 mg | ORAL | @ 06:00:00

## 2024-02-20 MED ADMIN — pantoprazole (Protonix) EC tablet 40 mg: 40 mg | ORAL | @ 23:00:00

## 2024-02-20 MED ADMIN — pantoprazole (Protonix) EC tablet 40 mg: 40 mg | ORAL | @ 15:00:00

## 2024-02-20 MED ADMIN — diphenhydrAMINE (BENADRYL) injection: 25 mg | INTRAVENOUS | @ 11:00:00

## 2024-02-20 MED ADMIN — diphenhydrAMINE (BENADRYL) injection: 25 mg | INTRAVENOUS | @ 03:00:00

## 2024-02-20 MED ADMIN — diphenhydrAMINE (BENADRYL) injection: 25 mg | INTRAVENOUS | @ 06:00:00

## 2024-02-20 MED ADMIN — diphenhydrAMINE (BENADRYL) injection: 25 mg | INTRAVENOUS | @ 15:00:00

## 2024-02-20 MED ADMIN — diphenhydrAMINE (BENADRYL) injection: 25 mg | INTRAVENOUS | @ 19:00:00

## 2024-02-20 MED ADMIN — diphenhydrAMINE (BENADRYL) injection: 25 mg | INTRAVENOUS | @ 23:00:00

## 2024-02-20 MED ADMIN — cetirizine (ZYRTEC) tablet 20 mg: 20 mg | ORAL | @ 15:00:00

## 2024-02-20 MED ADMIN — ondansetron (ZOFRAN) injection 4 mg: 4 mg | INTRAVENOUS | @ 17:00:00

## 2024-02-20 MED ADMIN — ondansetron (ZOFRAN) injection 4 mg: 4 mg | INTRAVENOUS

## 2024-02-20 MED ADMIN — cetirizine (ZYRTEC) tablet 20 mg: 20 mg | ORAL | @ 06:00:00 | Stop: 2024-02-20

## 2024-02-20 MED ADMIN — famotidine (PEPCID) tablet 20 mg: 20 mg | ORAL | @ 03:00:00

## 2024-02-20 MED ADMIN — oxyCODONE (ROXICODONE) 5 mg/5 mL solution 20 mg: 20 mg | ORAL | @ 15:00:00 | Stop: 2024-02-23

## 2024-02-20 MED ADMIN — oxyCODONE (ROXICODONE) 5 mg/5 mL solution 20 mg: 20 mg | ORAL | @ 11:00:00 | Stop: 2024-02-23

## 2024-02-20 MED ADMIN — oxyCODONE (ROXICODONE) 5 mg/5 mL solution 20 mg: 20 mg | ORAL | @ 06:00:00 | Stop: 2024-02-23

## 2024-02-20 MED ADMIN — oxyCODONE (ROXICODONE) 5 mg/5 mL solution 20 mg: 20 mg | ORAL | @ 03:00:00 | Stop: 2024-02-23

## 2024-02-20 MED ADMIN — oxyCODONE (ROXICODONE) 5 mg/5 mL solution 20 mg: 20 mg | ORAL | @ 19:00:00 | Stop: 2024-02-23

## 2024-02-20 MED ADMIN — oxyCODONE (ROXICODONE) 5 mg/5 mL solution 20 mg: 20 mg | ORAL | @ 23:00:00 | Stop: 2024-02-23

## 2024-02-20 MED ADMIN — diphenoxylate-atropine (LOMOTIL) 2.5-0.025 mg per tablet 1 tablet: 1 | ORAL | @ 11:00:00

## 2024-02-20 MED ADMIN — diphenoxylate-atropine (LOMOTIL) 2.5-0.025 mg per tablet 1 tablet: 1 | ORAL | @ 03:00:00

## 2024-02-20 MED ADMIN — escitalopram oxalate (LEXAPRO) tablet 5 mg: 5 mg | ORAL | @ 15:00:00

## 2024-02-20 MED ADMIN — HYDROmorphone (PF) (DILAUDID) injection 1 mg: 1 mg | INTRAVENOUS | @ 17:00:00 | Stop: 2024-02-24

## 2024-02-20 MED ADMIN — HYDROmorphone (PF) (DILAUDID) injection 1 mg: 1 mg | INTRAVENOUS | @ 08:00:00 | Stop: 2024-02-24

## 2024-02-20 MED ADMIN — HYDROmorphone (PF) (DILAUDID) injection 1 mg: 1 mg | INTRAVENOUS | Stop: 2024-02-24

## 2024-02-20 MED ADMIN — butalbital-acetaminophen-caffeine (ESGIC) per tablet 1 tablet: 1 | ORAL | @ 17:00:00

## 2024-02-20 MED ADMIN — methylPREDNISolone sodium succinate (SOLU-Medrol) injection 20 mg: 20 mg | INTRAVENOUS | @ 11:00:00 | Stop: 2024-02-21

## 2024-02-20 MED ADMIN — methylPREDNISolone sodium succinate (SOLU-Medrol) injection 20 mg: 20 mg | INTRAVENOUS | @ 23:00:00 | Stop: 2024-02-21

## 2024-02-20 MED ADMIN — lactated Ringers infusion: 100 mL/h | INTRAVENOUS | @ 03:00:00 | Stop: 2024-02-20

## 2024-02-20 MED ADMIN — zinc oxide-cod liver oil (DESITIN 40%) Paste: TOPICAL | @ 19:00:00

## 2024-02-20 NOTE — Plan of Care (Signed)
 Shift Summary  PRN diphenhydramine  and cetirizine  were administered for itching, burning, and allergies, with patient also receiving PRN HYDROmorphone  for pain relief .  Pain scores fluctuated but improved by the end of the shift after interventions.  Fall prevention and safety interventions were consistently implemented, and no falls or injuries occurred.  Skin integrity concerns of rash and bruising were managed with hygiene and antihistamines, with no new breakdown noted.  Family remained at the bedside for the duration of the shift and were supportive of cares.  MD was contacted when patient complained of worsening itching, skin redness, and burning feeling. MD said to give benadryl  dose a little early.  LR was started and continued to be infused at 100 mL/hr.  MD was notified of the patient have 1 episode of blood per rectum. Scheduled CBC was collected afterwards and MD said to continue to monitor.  Patient remained under close observation with stable safety and comfort measures throughout the shift.    Absence of Hospital-Acquired Illness or Injury: No new hospital-acquired illnesses or injuries were documented during the shift, and safety interventions such as environmental modifications and fall reduction programs were consistently maintained.    Optimal Comfort and Wellbeing: Comfort was challenged by ongoing abdominal pain and intermittent itching and burning, with PRN diphenhydramine  and cetirizine  administered for symptom relief; patient declined scheduled oxycodone , and pain scores fluctuated but improved by end of shift.    Optimal Pain Control and Function: Abdominal pain remained constant and sharp throughout the shift, with pain scores peaking at 8 and decreasing to 3 after PRN HYDROmorphone  administration and patient declining scheduled oxycodone ; pain was described as chronic and did not change in character.    Absence of Fall and Fall-Related Injury: Fall prevention strategies were maintained, including hourly visual checks, toileting every two hours, and environmental modifications, with no falls or injuries reported during the shift.    Skin Health and Integrity: Generalized rash and scattered bruising were present at the start of the shift, with no new skin breakdown or changes documented; CHG wipes were used for hygiene and PRN antihistamines were given for itching and burning.

## 2024-02-20 NOTE — Plan of Care (Signed)
 Patient alert and oriented x4. VSS on RA. On tele and cont o2 with no calls. PRNs given as needed. Up ad lib. Family by bedside.     Shift Summary  HYDROmorphone  was administered for severe abdominal pain, resulting in improved pain scores later in the shift.   Ondansetron  was given for nausea, and no further symptoms were documented after activity.   Family was present throughout the shift, and the patient participated in therapeutic activities and maintained a calm, cooperative demeanor.   Fall prevention and safety interventions were consistently maintained, with no falls or injuries reported.   Overall, the patient remained safe and comfortable with effective symptom management during the shift.     Absence of Hospital-Acquired Illness or Injury: No new hospital-acquired illnesses or injuries were documented during the shift, and the environment was consistently maintained as safe with appropriate safety interventions in place.     Optimal Comfort and Wellbeing: Comfort measures were supported by environmental modifications, family presence, and psychosocial status remained within defined limits; patient was calm, cooperative, and engaged in therapeutic activities.     Optimal Pain Control and Function: Abdominal pain increased to 8/10 late morning but decreased to 4/10 after HYDROmorphone  was administered; patient declined further intervention and pain was reassessed as improved.     Absence of Fall and Fall-Related Injury: Fall prevention strategies were consistently implemented, including hourly checks, toileting assistance, and side rails up; no falls or injuries occurred.     Nausea and Vomiting Relief: Nausea was reported in the morning and ondansetron  was administered PRN, with no further symptoms noted during or after activity.

## 2024-02-20 NOTE — Progress Notes (Signed)
 Daily Progress Note    Assessment/Plan:    [name] [name] is a 23 y.o. 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    Acute on chronic LGI bleeding - Iron  Deficiency: Increased bleeding and pain for the past ~two weeks in the setting of prior colectomy and ileoanal anastamosis as below. Has had prior ulcers affecting her J pouch and history of prior GIBs. EGD and pouchoscopy 11/21 noted multiple (30-50) gastric polyps, no specimens collected and no evidence of pouchitis.  Biopsy did reveal adenoma (not high grade) that will need resection. She continues to have frequent BMs (>10/day) and Hgb has remained stable in 9s - 10. She has reported worsening abdominal pain/sense of distention and pulling lower abd and now epigastric and right flank. CT enterography 12/6 with mild asymmetric thickening of the small bowel leading into the ileoanal anastomosis which may be exaggerated due to under distention and a few additional mildly thickened small bowel loops in the lower abdomen and pelvis which could be seen with enteritis. CTA on 12/11 with normal SMA and IMA. Moderate stenosis of celiac trunk likely a red herring.  - With ongoing bleeding, discussed transfer to [facility] for double balloon endoscopy - [facility] declined on 12/1 and requests outpatient follow up instead end of December  - IV ondansetron  4 mg Q6hr PRN, scopolamine  patch, and prn PO promethazine   - continue diphenoxylate -atropine  (Lomotil ) qid with prn loperamide  ordered  - Multidisciplinary discussion with GI (inpatient and outpatient providers) and oncology regarding TPN for bowel rest. This has been attempted in the past without impact on bleeding or abdominal pain. Continue enteral feeding    Iron  def anemia from chronic blood loss  S/p IV iron  dextran on 11/22 and 12/9. She has reactions to iron . See Epic flag for pre and post-treatment for iron  infusions  - Wean methypred to 20 mg IV BID. 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 Loris. Most recently trialed on nirogacestat , though recently, this has been held due to diarrhea and bleeding.  - Oncology aware of admission but primary oncology is out. Will need to discuss nirogacestat , doxil, or sorafenib  when he returns  - Breast US  obtained on 11/22 and did not show evidence of fluid collections or infection, but a 'full diagnostic evaluation' would need to be considered. Oncology recommended a breast MRI to evaluate     Acute on Chronic Pain: Follows with [facility] management clinic, primary is [name] [name], [name] [name]. Per pain plan:  - Continue home Butrans  20 mcg/hr patch q7 days  - Oxycodone  liquid 20 mg Q4hr PRN  - IV hydromorphone  1 mg to Q6hr PRN  - Baclofen  5mg  TID  - Inbox message to Dr. Daring on discharge for f/u     Mild adrenal insufficiency: Tentative diagnosis during last admission  - Decreased methylpred to 20 mg IV q12 h post iron  12/12  - Restart hydrocortisone  10 mg daily on discharge     GERD:  - Continue home famotidine  20 mg nightly  - Continue pantoprazole  40 mg p.o. bid    Poor PO Intake  - RD involved continue to encourage PO intake and supplementation  - Pt reports frequent outpatient IVF infusions 2/2 presyncopal symptoms. Will give 1 L LR overnight tonight  Depression/Anxiety:  - Appreciate Psychology recs  - Continue home Lexapro  5 mg daily   - Continue home lorazepam  0.5 mg PRN  - Palliative care consult for psychosocial support inpatient and outpatient    Disposition: Home   DVT Prophylaxis: encourage ambulation  Diet: Regular     Issues Impacting Complexity of Management:  -Parenteral controlled medications: IV Dilaudid     Medical Decision Making: Discussed the patient's management and/or test interpretation with primary pain physician as summarized within this note    I personally spent greater than 55 minutes face-to-face and non-face-to-face in the care of this patient, which includes all pre, intra, and post visit time on the date of service.  All documented time was specific to the E/M visit and does not include any procedures that may have been performed.    [name] [name], MD  Professor of Internal Medicine and Pediatrics  February 20, 2024 8:29 AM       __________________________________________________________________    Subjective:  More itching and redness of skin (mother has pictures of hives), got IV diphenhydramine  a little early.  Usually on scheduled cetirizine  longer after IV iron  infusion    Objective    Temp:  [36.4 ??C (97.5 ??F)-37.1 ??C (98.8 ??F)] 36.9 ??C (98.4 ??F)  Pulse:  [56-77] 56  Resp:  [18-19] 18  BP: (106-137)/(58-83) 118/66  SpO2:  [95 %-100 %] 98 %    Unchanged on 12/13  Young woman sitting upright in bed  Regular rate  Normal WOB on RA, lung sounds clear  Abdomen diffuse tenderness and more fullness in upper abdomen  Extremities warm

## 2024-02-21 MED ADMIN — acetaminophen (TYLENOL) tablet 1,000 mg: 1000 mg | ORAL | @ 07:00:00

## 2024-02-21 MED ADMIN — acetaminophen (TYLENOL) tablet 1,000 mg: 1000 mg | ORAL | @ 23:00:00

## 2024-02-21 MED ADMIN — acetaminophen (TYLENOL) tablet 1,000 mg: 1000 mg | ORAL | @ 15:00:00

## 2024-02-21 MED ADMIN — pantoprazole (Protonix) EC tablet 40 mg: 40 mg | ORAL | @ 23:00:00

## 2024-02-21 MED ADMIN — pantoprazole (Protonix) EC tablet 40 mg: 40 mg | ORAL | @ 13:00:00

## 2024-02-21 MED ADMIN — diphenhydrAMINE (BENADRYL) injection: 25 mg | INTRAVENOUS | @ 03:00:00

## 2024-02-21 MED ADMIN — diphenhydrAMINE (BENADRYL) injection: 25 mg | INTRAVENOUS | @ 19:00:00

## 2024-02-21 MED ADMIN — diphenhydrAMINE (BENADRYL) injection: 25 mg | INTRAVENOUS | @ 07:00:00

## 2024-02-21 MED ADMIN — diphenhydrAMINE (BENADRYL) injection: 25 mg | INTRAVENOUS | @ 15:00:00

## 2024-02-21 MED ADMIN — diphenhydrAMINE (BENADRYL) injection: 25 mg | INTRAVENOUS | @ 23:00:00

## 2024-02-21 MED ADMIN — diphenhydrAMINE (BENADRYL) injection: 25 mg | INTRAVENOUS | @ 11:00:00

## 2024-02-21 MED ADMIN — cetirizine (ZYRTEC) tablet 20 mg: 20 mg | ORAL | @ 03:00:00

## 2024-02-21 MED ADMIN — cetirizine (ZYRTEC) tablet 20 mg: 20 mg | ORAL | @ 15:00:00

## 2024-02-21 MED ADMIN — ondansetron (ZOFRAN) injection 4 mg: 4 mg | INTRAVENOUS | @ 21:00:00

## 2024-02-21 MED ADMIN — ondansetron (ZOFRAN) injection 4 mg: 4 mg | INTRAVENOUS | @ 07:00:00

## 2024-02-21 MED ADMIN — famotidine (PEPCID) tablet 20 mg: 20 mg | ORAL | @ 03:00:00

## 2024-02-21 MED ADMIN — oxyCODONE (ROXICODONE) 5 mg/5 mL solution 20 mg: 20 mg | ORAL | @ 15:00:00 | Stop: 2024-02-23

## 2024-02-21 MED ADMIN — oxyCODONE (ROXICODONE) 5 mg/5 mL solution 20 mg: 20 mg | ORAL | @ 03:00:00 | Stop: 2024-02-23

## 2024-02-21 MED ADMIN — oxyCODONE (ROXICODONE) 5 mg/5 mL solution 20 mg: 20 mg | ORAL | @ 19:00:00 | Stop: 2024-02-23

## 2024-02-21 MED ADMIN — oxyCODONE (ROXICODONE) 5 mg/5 mL solution 20 mg: 20 mg | ORAL | @ 11:00:00 | Stop: 2024-02-23

## 2024-02-21 MED ADMIN — oxyCODONE (ROXICODONE) 5 mg/5 mL solution 20 mg: 20 mg | ORAL | @ 23:00:00 | Stop: 2024-02-23

## 2024-02-21 MED ADMIN — oxyCODONE (ROXICODONE) 5 mg/5 mL solution 20 mg: 20 mg | ORAL | @ 07:00:00 | Stop: 2024-02-23

## 2024-02-21 MED ADMIN — diphenoxylate-atropine (LOMOTIL) 2.5-0.025 mg per tablet 1 tablet: 1 | ORAL | @ 03:00:00

## 2024-02-21 MED ADMIN — diphenoxylate-atropine (LOMOTIL) 2.5-0.025 mg per tablet 1 tablet: 1 | ORAL | @ 11:00:00

## 2024-02-21 MED ADMIN — escitalopram oxalate (LEXAPRO) tablet 5 mg: 5 mg | ORAL | @ 15:00:00

## 2024-02-21 MED ADMIN — HYDROmorphone (PF) (DILAUDID) injection 1 mg: 1 mg | INTRAVENOUS | @ 13:00:00 | Stop: 2024-02-24

## 2024-02-21 MED ADMIN — HYDROmorphone (PF) (DILAUDID) injection 1 mg: 1 mg | INTRAVENOUS | @ 06:00:00 | Stop: 2024-02-24

## 2024-02-21 MED ADMIN — HYDROmorphone (PF) (DILAUDID) injection 1 mg: 1 mg | INTRAVENOUS | @ 21:00:00 | Stop: 2024-02-24

## 2024-02-21 MED ADMIN — scopolamine (TRANSDERM-SCOP) 1 mg over 3 days topical patch 1 mg: 1 | TOPICAL | @ 07:00:00

## 2024-02-21 MED ADMIN — methylPREDNISolone sodium succinate (SOLU-Medrol) injection 20 mg: 20 mg | INTRAVENOUS | @ 11:00:00 | Stop: 2024-02-21

## 2024-02-21 MED ADMIN — methylPREDNISolone sodium succinate (SOLU-Medrol) injection 10 mg: 10 mg | INTRAVENOUS | @ 17:00:00 | Stop: 2024-02-23

## 2024-02-21 MED ADMIN — furosemide (LASIX) injection 20 mg: 20 mg | INTRAVENOUS | @ 17:00:00 | Stop: 2024-02-21

## 2024-02-21 NOTE — Plan of Care (Addendum)
 Patient remained alert oriented X 4. On room air. Vitals stable. Patient PRN medications given as per orders. Patient on telemetry, no calls from tele. Fall precaution maintained. Call bell bed side table within reach. Family at bedside involved in care.     Shift Summary  HYDROmorphone  was administered twice for persistent abdominal pain, but pain intensity increased slightly during the shift.    Diphenhydramine  was given twice for allergies and itching, and ondansetron  was administered for nausea.    Methylprednisolone  and furosemide  were administered, and diphenoxylate -atropine  was refused.    Family was present and engaged throughout the shift.    Patient remained in bed with safety interventions in place, and no new hospital-acquired complications were documented.     Absence of Hospital-Acquired Illness or Injury: Aseptic technique and environmental surveillance were maintained throughout the shift, and safety interventions such as low bed positioning, fall reduction program, and side rails were consistently implemented; no new hospital-acquired issues were documented.     Optimal Comfort and Wellbeing: Comfort interventions included PRN administration of diphenhydramine  for allergies and itching, and ondansetron  for nausea; patient was observed sleeping at one point, and oral care was performed independently.     Rounds/Family Conference: Family was present and visiting at multiple intervals throughout the shift, and updates were provided.     Optimal Pain Control and Function: Abdominal pain remained constant and ongoing, with a slight increase in intensity from 8 to 9 on the pain scale despite two administrations of HYDROmorphone ; pain descriptors shifted from shooting to squeezing and shooting.

## 2024-02-21 NOTE — Progress Notes (Signed)
 Daily Progress Note    Assessment/Plan:    [name] [name] is a 23 y.o. 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    Acute on chronic LGI bleeding - Iron  Deficiency: Increased bleeding and pain for the past ~two weeks in the setting of prior colectomy and ileoanal anastamosis as below. Has had prior ulcers affecting her J pouch and history of prior GIBs. EGD and pouchoscopy 11/21 noted multiple (30-50) gastric polyps, no specimens collected and no evidence of pouchitis.  Biopsy did reveal adenoma (not high grade) that will need resection. She continues to have frequent BMs (>10/day) and Hgb has remained stable in 9s - 10. She has reported worsening abdominal pain/sense of distention and pulling lower abd and now epigastric and right flank. CT enterography 12/6 with mild asymmetric thickening of the small bowel leading into the ileoanal anastomosis which may be exaggerated due to under distention and a few additional mildly thickened small bowel loops in the lower abdomen and pelvis which could be seen with enteritis. CTA on 12/11 with normal SMA and IMA. Moderate stenosis of celiac trunk likely a red herring.  - With ongoing bleeding, discussed transfer to [facility] for double balloon endoscopy - [facility] declined on 12/1 and requests outpatient follow up instead end of December  - IV ondansetron  4 mg Q6hr PRN, scopolamine  patch, and prn PO promethazine   - continue diphenoxylate -atropine  (Lomotil ) qid with prn loperamide  ordered  - Multidisciplinary discussion with GI (inpatient and outpatient providers) and oncology regarding TPN for bowel rest. This has been attempted in the past without impact on bleeding or abdominal pain. Continue enteral feeding    Iron  def anemia from chronic blood loss  S/p IV iron  dextran on 11/22 and 12/9. She has reactions to iron . See Epic flag for pre and post-treatment for iron  infusions  - Wean methypred to 10 mg IV BID. Having some orthopnea and weight is up. Will give one dose of furosemide  20 mg IV.  - Continue IV diphenhydramine  for now  - Restarted scheduled cetirizine  given hives 12/13    Desmoid Fibromatosis s/p Proctocolectomy with Ileoanal anastomosis  - Familial Adenomatous Polyposis - Concern for Breast Lump  Follows with Dr. Loris. Most recently trialed on nirogacestat , though recently, this has been held due to diarrhea and bleeding.  - Oncology aware of admission but primary oncology is out. Will need to discuss nirogacestat , doxil, or sorafenib  when he returns  - Breast US  obtained on 11/22 and did not show evidence of fluid collections or infection, but a 'full diagnostic evaluation' would need to be considered. Oncology recommended a breast MRI to evaluate     Acute on Chronic Pain: Follows with [facility] management clinic, primary is Vinie Daring, [place]. Per pain plan:  - Continue home Butrans  20 mcg/hr patch q7 days  - Oxycodone  liquid 20 mg Q4hr PRN  - IV hydromorphone  1 mg to Q6hr PRN  - Baclofen  5mg  TID  - Inbox message to Dr. Daring on discharge for f/u     Mild adrenal insufficiency: Tentative diagnosis during last admission  - Decrease methylpred to 10 mg IV q12 h post iron  12/12  - Restart hydrocortisone  10 mg daily on discharge     GERD:  - Continue home famotidine  20 mg nightly  - Continue pantoprazole  40 mg p.o. bid    Poor PO Intake  - RD involved continue to encourage PO intake and supplementation  Depression/Anxiety:  - Appreciate Psychology recs  - Continue home Lexapro  5 mg daily   - Continue home lorazepam  0.5 mg PRN  - Palliative care consult for psychosocial support inpatient and outpatient    Disposition: Home   DVT Prophylaxis: encourage ambulation  Diet: Regular     Issues Impacting Complexity of Management:  -Parenteral controlled medications: IV Dilaudid     Medical Decision Making: Discussed the patient's management and/or test interpretation with primary pain physician as summarized within this note    I personally spent greater than 55 minutes face-to-face and non-face-to-face in the care of this patient, which includes all pre, intra, and post visit time on the date of service.  All documented time was specific to the E/M visit and does not include any procedures that may have been performed.    Dalbert [name], MD  Professor of Internal Medicine and Pediatrics  February 21, 2024 8:47 AM     __________________________________________________________________    Subjective:  Complains of chest tightness when bed is flat. Mother reports weight is 130 lbs and she typically is around 123 lbs. Skin better today but hoarse voice worse. Also complains of headache.    Objective    Temp:  [36.6 ??C (97.9 ??F)-37 ??C (98.6 ??F)] 37 ??C (98.6 ??F)  Pulse:  [61-80] 70  SpO2 Pulse:  [68] 68  Resp:  [18] 18  BP: (103-114)/(52-76) 110/57  SpO2:  [99 %-100 %] 100 %    Unchanged on 12/14  Young woman sitting upright in bed  Regular rate  Normal WOB on RA, lung sounds clear  Abdomen diffuse tenderness and more fullness in upper abdomen  Extremities warm

## 2024-02-21 NOTE — Plan of Care (Signed)
 Shift Summary  Pain escalated during the shift, peaking at 9, and was managed with PRN HYDROmorphone  and scheduled Oxycodone .  PRN ondansetron  and diphenhydramine  were administered for nausea and itching, respectively.   Skin assessment identified generalized rash and scattered bruising, but no new breakdown was noted.   Fall prevention and safety interventions were consistently implemented, with no falls or injuries documented.   Family remained at the bedside for the duration of the shift and were supportive of cares.  Patient was continuously monitored via remote telemetry for O2 and tele.  Patient remained under close observation with ongoing comfort and safety measures in place throughout the shift.    Absence of Hospital-Acquired Illness or Injury: No new hospital-acquired illnesses or injuries were documented during the shift, and safety interventions were consistently maintained.    Optimal Comfort and Wellbeing: Comfort was challenged by ongoing abdominal pain and discomfort, with pain scores increasing before decreasing slightly by the end of the shift; patient declined scheduled oxycodone , and PRN medications were given for nausea and itching.    Optimal Pain Control and Function: Abdominal pain remained constant and chronic, peaking at a score of 9 before decreasing to 7 after PRN HYDROmorphone  was administered; patient repeatedly declined scheduled oxycodone .    Absence of Fall and Fall-Related Injury: Fall prevention strategies, including environmental modifications, frequent toileting, hourly checks, and side rails, were maintained throughout the shift with no falls documented.    Skin Health and Integrity: Skin assessment revealed generalized rash and scattered bruising, with exceptions to within defined limits noted; no new skin breakdown was documented during the shift.

## 2024-02-22 DIAGNOSIS — D48119 Desmoid tumor: Principal | ICD-10-CM

## 2024-02-22 LAB — BASIC METABOLIC PANEL
ANION GAP: 7 mmol/L (ref 5–14)
BLOOD UREA NITROGEN: 11 mg/dL (ref 9–23)
BUN / CREAT RATIO: 21
CALCIUM: 9 mg/dL (ref 8.7–10.4)
CHLORIDE: 102 mmol/L (ref 98–107)
CO2: 33 mmol/L — ABNORMAL HIGH (ref 20.0–31.0)
CREATININE: 0.52 mg/dL — ABNORMAL LOW (ref 0.55–1.02)
EGFR CKD-EPI (2021) FEMALE: >90 mL/min/1.73m2 (ref >=60)
GLUCOSE RANDOM: 89 mg/dL (ref 70–179)
POTASSIUM: 3.8 mmol/L (ref 3.4–4.8)
SODIUM: 142 mmol/L (ref 135–145)

## 2024-02-22 LAB — MAGNESIUM: MAGNESIUM: 2.3 mg/dL (ref 1.6–2.6)

## 2024-02-22 LAB — CBC
HEMATOCRIT: 33.7 % — ABNORMAL LOW (ref 34.0–44.0)
HEMOGLOBIN: 11.1 g/dL — ABNORMAL LOW (ref 11.3–14.9)
MEAN CORPUSCULAR HEMOGLOBIN CONC: 33 g/dL (ref 32.0–36.0)
MEAN CORPUSCULAR HEMOGLOBIN: 28.5 pg (ref 25.9–32.4)
MEAN CORPUSCULAR VOLUME: 86.1 fL (ref 77.6–95.7)
MEAN PLATELET VOLUME: 9.3 fL (ref 6.8–10.7)
PLATELET COUNT: 307 10*9/L (ref 150–450)
RED BLOOD CELL COUNT: 3.91 10*12/L — ABNORMAL LOW (ref 3.95–5.13)
RED CELL DISTRIBUTION WIDTH: 18.5 % — ABNORMAL HIGH (ref 12.2–15.2)
WBC ADJUSTED: 14.3 10*9/L — ABNORMAL HIGH (ref 3.6–11.2)

## 2024-02-22 LAB — PHOSPHORUS: PHOSPHORUS: 3.9 mg/dL (ref 2.4–5.1)

## 2024-02-22 MED ADMIN — acetaminophen (TYLENOL) tablet 1,000 mg: 1000 mg | ORAL | @ 15:00:00

## 2024-02-22 MED ADMIN — acetaminophen (TYLENOL) tablet 1,000 mg: 1000 mg | ORAL | @ 23:00:00

## 2024-02-22 MED ADMIN — acetaminophen (TYLENOL) tablet 1,000 mg: 1000 mg | ORAL | @ 08:00:00

## 2024-02-22 MED ADMIN — pantoprazole (Protonix) EC tablet 40 mg: 40 mg | ORAL | @ 23:00:00

## 2024-02-22 MED ADMIN — pantoprazole (Protonix) EC tablet 40 mg: 40 mg | ORAL | @ 12:00:00

## 2024-02-22 MED ADMIN — diphenhydrAMINE (BENADRYL) injection: 25 mg | INTRAVENOUS | @ 08:00:00

## 2024-02-22 MED ADMIN — diphenhydrAMINE (BENADRYL) injection: 25 mg | INTRAVENOUS | @ 16:00:00

## 2024-02-22 MED ADMIN — diphenhydrAMINE (BENADRYL) injection: 25 mg | INTRAVENOUS | @ 04:00:00

## 2024-02-22 MED ADMIN — diphenhydrAMINE (BENADRYL) injection: 25 mg | INTRAVENOUS | @ 20:00:00

## 2024-02-22 MED ADMIN — diphenhydrAMINE (BENADRYL) injection: 25 mg | INTRAVENOUS | @ 12:00:00

## 2024-02-22 MED ADMIN — cetirizine (ZYRTEC) tablet 20 mg: 20 mg | ORAL | @ 01:00:00

## 2024-02-22 MED ADMIN — cetirizine (ZYRTEC) tablet 20 mg: 20 mg | ORAL | @ 15:00:00

## 2024-02-22 MED ADMIN — ondansetron (ZOFRAN) injection 4 mg: 4 mg | INTRAVENOUS | @ 05:00:00

## 2024-02-22 MED ADMIN — ondansetron (ZOFRAN) injection 4 mg: 4 mg | INTRAVENOUS | @ 19:00:00

## 2024-02-22 MED ADMIN — famotidine (PEPCID) tablet 20 mg: 20 mg | ORAL | @ 01:00:00

## 2024-02-22 MED ADMIN — oxyCODONE (ROXICODONE) 5 mg/5 mL solution 20 mg: 20 mg | ORAL | @ 12:00:00 | Stop: 2024-02-23

## 2024-02-22 MED ADMIN — oxyCODONE (ROXICODONE) 5 mg/5 mL solution 20 mg: 20 mg | ORAL | @ 08:00:00 | Stop: 2024-02-23

## 2024-02-22 MED ADMIN — oxyCODONE (ROXICODONE) 5 mg/5 mL solution 20 mg: 20 mg | ORAL | @ 20:00:00 | Stop: 2024-02-23

## 2024-02-22 MED ADMIN — oxyCODONE (ROXICODONE) 5 mg/5 mL solution 20 mg: 20 mg | ORAL | @ 04:00:00 | Stop: 2024-02-23

## 2024-02-22 MED ADMIN — oxyCODONE (ROXICODONE) 5 mg/5 mL solution 20 mg: 20 mg | ORAL | @ 16:00:00 | Stop: 2024-02-23

## 2024-02-22 MED ADMIN — diphenoxylate-atropine (LOMOTIL) 2.5-0.025 mg per tablet 1 tablet: 1 | ORAL | @ 12:00:00

## 2024-02-22 MED ADMIN — diphenoxylate-atropine (LOMOTIL) 2.5-0.025 mg per tablet 1 tablet: 1 | ORAL | @ 04:00:00

## 2024-02-22 MED ADMIN — escitalopram oxalate (LEXAPRO) tablet 5 mg: 5 mg | ORAL | @ 15:00:00

## 2024-02-22 MED ADMIN — HYDROmorphone (PF) (DILAUDID) injection 1 mg: 1 mg | INTRAVENOUS | @ 19:00:00 | Stop: 2024-02-24

## 2024-02-22 MED ADMIN — HYDROmorphone (PF) (DILAUDID) injection 1 mg: 1 mg | INTRAVENOUS | @ 13:00:00 | Stop: 2024-02-24

## 2024-02-22 MED ADMIN — HYDROmorphone (PF) (DILAUDID) injection 1 mg: 1 mg | INTRAVENOUS | @ 05:00:00 | Stop: 2024-02-24

## 2024-02-22 MED ADMIN — methylPREDNISolone sodium succinate (SOLU-Medrol) injection 10 mg: 10 mg | INTRAVENOUS | @ 04:00:00 | Stop: 2024-02-23

## 2024-02-22 MED ADMIN — furosemide (LASIX) injection 20 mg: 20 mg | INTRAVENOUS | @ 17:00:00 | Stop: 2024-02-22

## 2024-02-22 NOTE — Consults (Signed)
 CV[facility] Liaison - Port Access Note    Indications:   [employer]    The CVAD Liaison has assessed this patient's port site. Appropriate Huber needle size was obtained.  Port was accessed and dressing applied per protocol.  Blood return was noted.  Line flushes freely.     Patient prefers the port gel and diamond tegaderm.  The primary RN was notified.     Thank you for this consult,  Aldona FORBES Birmingham, RN BSN, CVAD Liaison     Consult Time 30 minutes (min)

## 2024-02-22 NOTE — Telephone Encounter (Signed)
 Copied from CRM #[id number]. Topic: Return Scheduling - Infusion Request  >> Feb 22, 2024  3:44 PM Davied BROCKS wrote:  Hi ,    Patient [name] [name] has called the phone room to reschedule their Infusion Appointment.     Patient's preference is reschedule 02/23/24 appt    Thank you,  Davied ONEIDA Filter  [facility]  [phone]

## 2024-02-22 NOTE — Plan of Care (Signed)
 Shift Summary  Pain remained high and constant, with HYDROmorphone  administered twice and pain scores fluctuating between 8 and 9.   Diphenhydramine  was given three times for allergy  symptoms, with no new reactions noted.   Family was present and updated throughout the shift.   Safety interventions, including side rails and environmental modifications, were consistently maintained.   Overall, patient maintained safety and skin integrity, with ongoing pain and supportive family presence.     Absence of Hospital-Acquired Illness or Injury: Braden Scale score remained at 19, and frequent weight shifts, pressure-redistributing mattress, and supine positioning were maintained throughout the shift; the environment was consistently documented as safe, and side rails were kept up at 2/4. No new skin or safety concerns were documented.     Rounds/Family Conference: Family was present and visiting at multiple intervals throughout the shift, with updates provided as needed.     Absence of Allergy  Symptoms: Diphenhydramine  was administered PRN three times, and no new allergy  symptoms were documented.     Optimal Pain Control and Function: Pain remained constant and aching throughout the shift, with scores fluctuating between 8 and 9; HYDROmorphone  was administered PRN twice, but pain did not improve significantly, and patient declined further intervention.

## 2024-02-22 NOTE — Plan of Care (Signed)
 A/Ox4, RA, VSS, afebrile, up ad lib. 2 BM. Telemetry/continuous pulse ox monitoring in progress, no calls from tele. PRN meds given, see MAR. Low bed, call bell within reach, fall precautions maintained. Family at bedside.    Shift Summary  Pain remained severe and constant despite administration of HYDROmorphone  and patient declined additional intervention.    Ondansetron  was given for nausea, with no further episodes documented.    Diphenhydramine  was administered twice for allergy  symptoms.    Family was present at bedside throughout the shift.    Overall, comfort and symptom management were attempted, but pain persisted at a high level.     Optimal Comfort and Wellbeing: Comfort interventions were limited as pain remained constant and high throughout the shift, with abdominal pain described as aching, shooting, and sharp; patient declined further intervention.     Absence of Allergy  Symptoms: Diphenhydramine  was administered twice during the shift for allergies.     Optimal Pain Control and Function: HYDROmorphone  was given for pain, but pain scores remained at 8 and pain characteristics did not change during the shift.     Nausea and Vomiting Relief: Ondansetron  was administered for nausea, with no further documentation of nausea or vomiting during the shift.

## 2024-02-22 NOTE — Progress Notes (Signed)
 Daily Progress Note    Assessment/Plan:    [name] [name] is a 23 y.o. female with [name] and desmoid fibromatosis s/p proctocolectomy with ileoanal anastomosis that presented to [facility] with GI bleed.    Acute on chronic LGI bleeding - Iron  Deficiency: Increased bleeding and pain for the past ~two weeks in the setting of prior colectomy and ileoanal anastamosis as below. Has had prior ulcers affecting her J pouch and history of prior GIBs. EGD and pouchoscopy 11/21 noted multiple (30-50) gastric polyps, no specimens collected and no evidence of pouchitis.  Biopsy did reveal adenoma (not high grade) that will need resection. She continues to have frequent BMs (>10/day) and Hgb has remained stable in 9s - 10. She has reported worsening abdominal pain/sense of distention and pulling lower abd and now epigastric and right flank. CT enterography 12/6 with mild asymmetric thickening of the small bowel leading into the ileoanal anastomosis which may be exaggerated due to under distention and a few additional mildly thickened small bowel loops in the lower abdomen and pelvis which could be seen with enteritis. CTA on 12/11 with normal SMA and IMA. Moderate stenosis of celiac trunk likely a red herring.  - With ongoing bleeding, discussed transfer to [facility] for double balloon endoscopy - [facility] declined on [facility]1 and requests outpatient follow up instead end of December  - IV ondansetron  4 mg Q6hr PRN, scopolamine  patch, and prn PO promethazine   - continue diphenoxylate -atropine  (Lomotil ) qid with prn loperamide  ordered  - Multidisciplinary discussion with GI (inpatient and outpatient providers) and oncology regarding TPN for bowel rest. This has been attempted in the past without impact on bleeding or abdominal pain. Continue enteral feeding    Iron  def anemia from chronic blood loss  S/p IV iron  dextran on 11/22 and 12/9. She has reactions to iron . See Epic flag for pre and post-treatment for iron  infusions  - Had some improvement in orthopnea with furosemide  20 mg IV  x 1 on 12/14, will repeat dose.   - Go back to home oral hydrocortisone  10 mg p.o. daily tomorrow, 12/16  - Continue IV diphenhydramine  for now. Typically stays on IV diphenhydramine  and hydromorphone  until she leaves  - Restarted scheduled cetirizine  given hives 12/13    Desmoid Fibromatosis s/p Proctocolectomy with Ileoanal anastomosis  - Familial Adenomatous Polyposis - Concern for Breast Lump  Follows with Dr. Loris. Most recently trialed on nirogacestat , though recently, this has been held due to diarrhea and bleeding.  - Oncology aware of admission but primary oncology is out. Will need to discuss nirogacestat , doxil, or sorafenib  when he returns  - Breast US  obtained on 11/22 and did not show evidence of fluid collections or infection, but a 'full diagnostic evaluation' would need to be considered. Oncology recommended a breast MRI to evaluate (ordered)     Acute on Chronic Pain: Follows with [facility] Pain management clinic, primary is Vinie Daring, [place]. Per pain plan:  - Continue home Butrans  20 mcg/hr patch q7 days  - Oxycodone  liquid 20 mg Q4hr PRN  - IV hydromorphone  1 mg to Q6hr PRN  - Baclofen  5mg  TID  - Inbox message to Dr. Vinie Lennox) Daring on discharge for f/u     Mild adrenal insufficiency: Tentative diagnosis during last admission  - D/c methylpred to 10 mg IV q12 h (weaning from 40 mg IV 12h post iron  12/12)  - Restart hydrocortisone  10 mg daily 12/16     GERD:  - Continue home famotidine  20 mg nightly  -  Continue pantoprazole  40 mg p.o. bid    Poor PO Intake  - RD involved continue to encourage PO intake and supplementation     Depression/Anxiety:  - Appreciate Psychology recs  - Continue home escitalopram  5 mg daily   - Continue home lorazepam  0.5 mg PRN  - Palliative care consult for psychosocial support inpatient and outpatient    Disposition: Home. Thinks she'll be ready in next 24-48 hours  DVT Prophylaxis: encourage ambulation  Diet: Regular Issues Impacting Complexity of Management:  -Parenteral controlled medications: IV Dilaudid     Medical Decision Making: Discussed the patient's management and/or test interpretation with primary pain physician as summarized within this note    I personally spent greater than 55 minutes face-to-face and non-face-to-face in the care of this patient, which includes all pre, intra, and post visit time on the date of service.  All documented time was specific to the E/M visit and does not include any procedures that may have been performed.    [name] [name], MD  Professor of Internal Medicine and Pediatrics  February 22, 2024 4:34 PM       __________________________________________________________________    Subjective:  Some improvement in orthopnea and the strength in her voice status post furosemide  20 mg IV x 1 yesterday.  Also notes some decrease in her abdominal swelling.  Requested labs to be obtained in preparation for potentially going home in the next 24 to 48 hours.    Objective    Temp:  [36.4 ??C (97.5 ??F)-36.8 ??C (98.2 ??F)] 36.8 ??C (98.2 ??F)  Pulse:  [71-96] 96  Resp:  [18] 18  BP: (102-115)/(53-70) 115/70  SpO2:  [98 %-100 %] 99 %    Unchanged 12/15  [name] woman sitting upright in bed  Regular rate  Normal WOB on RA, lung sounds clear  Abdomen diffuse tenderness and more fullness in upper abdomen  Extremities warm

## 2024-02-22 NOTE — Consults (Signed)
 [facility])   Consultation - Liaison (CL) Psychiatry  CL Psychology Follow-Up Note      Service Date:  02/22/24  Admit Date:  01/28/24  Clinician:  Olam Littles, PsyD  Intervention: 45 min Individual Psychotherapy  Face-to-Face Clinical Contact with Patient: 50 min  Pre-/Post-Encounter Activities: 15 min  Method of Interaction: In-Person    BACKGROUND INFORMATION AND REASON FOR REFERRAL:  Please see H&P for full details. 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 She was admitted to the Hospitalist service for management of the concerns above. Pt is well-known to clinical research associate from previous admissions. Please see prior notes for further details on psychosocial history. Psychology consulted to address psychological factors affecting Pt's medical condition.     Interval events:  -No adverse behavioral/psychiatric events since last seen by Psychology   -Palliative Care consult on 12/12    ASSESSMENT  IMPRESSIONS/SUMMARY    At follow-up, Pt's mood appeared overall stable. She continues to endorse low mood and exacerbated anxiety in the setting of her medical situation. Session topics included enhancing psychological flexibility through present-moment awareness and aligning actions with valued activities.     DIAGNOSTIC IMPRESSIONS    Major depressive disorder, recurrent episode, moderate  Generalized anxiety disorder  Posttraumatic stress disorder    Risk Assessment:  ASQ screening result: no intervention is necessary    -A full risk assessment was previously performed on 11/21.  Risk assessment remains essentially unchanged.    Current suicide risk: low risk  Current homicide risk: low risk      PLAN  RECOMMENDATIONS    ## Safety and Observation Level:   -This patient is not currently under IVC. If safety concerns arise, please page psychiatry for an evaluation. Recommend routine level of observation per primary team.    ## Follow-up:  - The patient desires ongoing follow-up from CL Psychology while medically inpatient.    ## Disposition:  -There are no psychological contraindications to discharging this patient when medically appropriate.   -Pt interested in scheduling outpatient therapy appointment with writer. Appointment coordinated for 1/15 at 9AM.  - When this patient is discharged, please ensure that their AVS includes information about the 75 Suicide & Crisis Lifeline.    ## Behavioral/environmental:   -Please adhere to delirium (prevention) protocol.   -Efforts to encourage normal circadian function are recommended.  Specifically, exposure to natural light during the day, minimization of light at night, consistent/routine times for sleep/wake, and avoiding of daytime naps (if possible) may be helpful.   -Please consolidate care during the night when feasible.    Olam GORMAN Littles, PsyD  Clinical Psychologist       SUBJECTIVE  SESSION CONTENT     Session began with a brief check-in. Pt recounted salient events that occurred over the weekend. She reported ongoing low mood and worsened anxiety in the setting of prolonged admission. Pt provided space to process emotional reactions, including anticipatory anxiety about upcoming procedure, sadness about spending the end of the semester in the hospital, and desire for increased connection with her identity outside of illness. Used ACT-based approach to discuss enhancing psychological flexibility through present-moment awareness and connecting with sources of meaning.     OBJECTIVE   MENTAL STATUS     Orientation and consciousness: AOx4   Appearance: Appears stated age  Behavior: Calm, Cooperative, Direct  eye contact, and Polite  Speech: Within normal limits  Language: Intact  Mood: Dysphoric  Affect: Full and Mood congruent  Perceptual disturbance (hallucinations, illusions): None  Thought process and association: Linear and coherent  Thought content (delusions, obsessions etc.): None  Insight: Fair  Judgement: Fair  Memory: WNL, although not formally assessed     EDUCATION/INTERVENTIONS:    -Completed mood check-in and reviewed the patient's current stressors and strategies to manage stress.  -Acceptance and commitment therapy  -Discussed the patient's treatment goals, revisited discussion of treatment options, and  explored goals of care.  -Continued to discuss pertinent findings with Pt's care team

## 2024-02-23 LAB — CBC W/ AUTO DIFF
BASOPHILS ABSOLUTE COUNT: 0 10*9/L (ref 0.0–0.1)
BASOPHILS RELATIVE PERCENT: 0.2 %
EOSINOPHILS ABSOLUTE COUNT: 0.3 10*9/L (ref 0.0–0.5)
EOSINOPHILS RELATIVE PERCENT: 2.7 %
HEMATOCRIT: 34 % (ref 34.0–44.0)
HEMOGLOBIN: 11.4 g/dL (ref 11.3–14.9)
LYMPHOCYTES ABSOLUTE COUNT: 3 10*9/L (ref 1.1–3.6)
LYMPHOCYTES RELATIVE PERCENT: 26.9 %
MEAN CORPUSCULAR HEMOGLOBIN CONC: 33.6 g/dL (ref 32.0–36.0)
MEAN CORPUSCULAR HEMOGLOBIN: 29.1 pg (ref 25.9–32.4)
MEAN CORPUSCULAR VOLUME: 86.6 fL (ref 77.6–95.7)
MEAN PLATELET VOLUME: 9.3 fL (ref 6.8–10.7)
MONOCYTES ABSOLUTE COUNT: 1.4 10*9/L — ABNORMAL HIGH (ref 0.3–0.8)
MONOCYTES RELATIVE PERCENT: 12.9 %
NEUTROPHILS ABSOLUTE COUNT: 6.4 10*9/L (ref 1.8–7.8)
NEUTROPHILS RELATIVE PERCENT: 57.3 %
PLATELET COUNT: 308 10*9/L (ref 150–450)
RED BLOOD CELL COUNT: 3.92 10*12/L — ABNORMAL LOW (ref 3.95–5.13)
RED CELL DISTRIBUTION WIDTH: 19.1 % — ABNORMAL HIGH (ref 12.2–15.2)
WBC ADJUSTED: 11.2 10*9/L (ref 3.6–11.2)

## 2024-02-23 LAB — C-REACTIVE PROTEIN: C-REACTIVE PROTEIN: <5 mg/L (ref <=10.0)

## 2024-02-23 LAB — LACTATE, VENOUS, WHOLE BLOOD: LACTATE BLOOD VENOUS: 1.1 mmol/L (ref 0.5–1.8)

## 2024-02-23 LAB — SLIDE REVIEW

## 2024-02-23 MED ADMIN — acetaminophen (TYLENOL) tablet 1,000 mg: 1000 mg | ORAL | @ 22:00:00

## 2024-02-23 MED ADMIN — acetaminophen (TYLENOL) tablet 1,000 mg: 1000 mg | ORAL | @ 08:00:00

## 2024-02-23 MED ADMIN — acetaminophen (TYLENOL) tablet 1,000 mg: 1000 mg | ORAL | @ 14:00:00

## 2024-02-23 MED ADMIN — pantoprazole (Protonix) EC tablet 40 mg: 40 mg | ORAL | @ 13:00:00

## 2024-02-23 MED ADMIN — pantoprazole (Protonix) EC tablet 40 mg: 40 mg | ORAL | @ 22:00:00

## 2024-02-23 MED ADMIN — diphenhydrAMINE (BENADRYL) injection: 25 mg | INTRAVENOUS | @ 08:00:00

## 2024-02-23 MED ADMIN — diphenhydrAMINE (BENADRYL) injection: 25 mg | INTRAVENOUS

## 2024-02-23 MED ADMIN — diphenhydrAMINE (BENADRYL) injection: 25 mg | INTRAVENOUS | @ 21:00:00

## 2024-02-23 MED ADMIN — diphenhydrAMINE (BENADRYL) injection: 25 mg | INTRAVENOUS | @ 04:00:00

## 2024-02-23 MED ADMIN — diphenhydrAMINE (BENADRYL) injection: 25 mg | INTRAVENOUS | @ 17:00:00

## 2024-02-23 MED ADMIN — diphenhydrAMINE (BENADRYL) injection: 25 mg | INTRAVENOUS | @ 13:00:00

## 2024-02-23 MED ADMIN — cetirizine (ZYRTEC) tablet 20 mg: 20 mg | ORAL | @ 14:00:00

## 2024-02-23 MED ADMIN — cetirizine (ZYRTEC) tablet 20 mg: 20 mg | ORAL | @ 02:00:00

## 2024-02-23 MED ADMIN — ondansetron (ZOFRAN) injection 4 mg: 4 mg | INTRAVENOUS | @ 22:00:00

## 2024-02-23 MED ADMIN — ondansetron (ZOFRAN) injection 4 mg: 4 mg | INTRAVENOUS | @ 02:00:00

## 2024-02-23 MED ADMIN — ondansetron (ZOFRAN) injection 4 mg: 4 mg | INTRAVENOUS | @ 13:00:00

## 2024-02-23 MED ADMIN — famotidine (PEPCID) tablet 20 mg: 20 mg | ORAL | @ 02:00:00

## 2024-02-23 MED ADMIN — hydrocortisone (CORTEF) tablet 10 mg: 10 mg | ORAL | @ 14:00:00

## 2024-02-23 MED ADMIN — oxyCODONE (ROXICODONE) 5 mg/5 mL solution 20 mg: 20 mg | ORAL | @ 13:00:00 | Stop: 2024-02-23

## 2024-02-23 MED ADMIN — oxyCODONE (ROXICODONE) 5 mg/5 mL solution 20 mg: 20 mg | ORAL | @ 08:00:00 | Stop: 2024-02-23

## 2024-02-23 MED ADMIN — oxyCODONE (ROXICODONE) 5 mg/5 mL solution 20 mg: 20 mg | ORAL | Stop: 2024-02-23

## 2024-02-23 MED ADMIN — oxyCODONE (ROXICODONE) 5 mg/5 mL solution 20 mg: 20 mg | ORAL | @ 22:00:00 | Stop: 2024-02-23

## 2024-02-23 MED ADMIN — oxyCODONE (ROXICODONE) 5 mg/5 mL solution 20 mg: 20 mg | ORAL | @ 04:00:00 | Stop: 2024-02-23

## 2024-02-23 MED ADMIN — oxyCODONE (ROXICODONE) 5 mg/5 mL solution 20 mg: 20 mg | ORAL | @ 17:00:00 | Stop: 2024-02-23

## 2024-02-23 MED ADMIN — diphenoxylate-atropine (LOMOTIL) 2.5-0.025 mg per tablet 1 tablet: 1 | ORAL

## 2024-02-23 MED ADMIN — diphenoxylate-atropine (LOMOTIL) 2.5-0.025 mg per tablet 1 tablet: 1 | ORAL | @ 13:00:00

## 2024-02-23 MED ADMIN — diphenoxylate-atropine (LOMOTIL) 2.5-0.025 mg per tablet 1 tablet: 1 | ORAL | @ 04:00:00

## 2024-02-23 MED ADMIN — diphenoxylate-atropine (LOMOTIL) 2.5-0.025 mg per tablet 1 tablet: 1 | ORAL | @ 22:00:00

## 2024-02-23 MED ADMIN — diphenoxylate-atropine (LOMOTIL) 2.5-0.025 mg per tablet 1 tablet: 1 | ORAL | @ 17:00:00

## 2024-02-23 MED ADMIN — escitalopram oxalate (LEXAPRO) tablet 5 mg: 5 mg | ORAL | @ 14:00:00

## 2024-02-23 MED ADMIN — HYDROmorphone (PF) (DILAUDID) injection 1 mg: 1 mg | INTRAVENOUS | @ 14:00:00 | Stop: 2024-02-24

## 2024-02-23 MED ADMIN — HYDROmorphone (PF) (DILAUDID) injection 1 mg: 1 mg | INTRAVENOUS | @ 21:00:00 | Stop: 2024-02-24

## 2024-02-23 MED ADMIN — HYDROmorphone (PF) (DILAUDID) injection 1 mg: 1 mg | INTRAVENOUS | @ 02:00:00 | Stop: 2024-02-24

## 2024-02-23 NOTE — Plan of Care (Signed)
 Shift Summary  Patient is alert and oriented ?? 4. On room air. Vital signs remain at baseline. Patient independent. Port site saline lock. Dressing clean, dry, and intact. Fall precautions maintained. Call bell within reach.Pain increased steadily throughout the shift despite two administrations of HYDROmorphone  and patient declining other interventions.    Ondansetron  was given twice for nausea, with no further escalation noted.    Fall prevention measures were maintained, including bed alarms, environmental modifications, and hourly checks, and patient ambulated once but later refused activity.    Skin integrity was supported with pressure reduction techniques and limited adhesive use, and Braden score remained adequate.    Overall, pain control was challenging, but safety and skin integrity goals were maintained.     Optimal Pain Control and Function: Abdominal pain remained constant and increased in intensity throughout the shift, peaking at 9/10 by late afternoon; HYDROmorphone  was administered twice, but pain did not decrease and patient declined other interventions.     Absence of Fall and Fall-Related Injury: Fall prevention strategies were consistently maintained, including bed alarms, environmental modifications, and hourly visual checks; patient ambulated once but later refused activity, with no fall-related injuries documented.     Skin Health and Integrity: Skin protection measures such as limited adhesive use and pressure-redistributing mattress were maintained throughout the shift, and Braden score remained adequate.     Nausea and Vomiting Relief: Ondansetron  was administered twice for nausea, with no further documentation of vomiting or escalation of symptoms.

## 2024-02-23 NOTE — Plan of Care (Signed)
 A/Ox4, RA, VSS, afebrile, up ad lib. PRN meds given, see MAR. Telemetry/continuous pulse ox monitoring in progress, no calls from tele. Low bed, call bell within reach, fall precautions maintained. Family at bedside.     Shift Summary  HYDROmorphone  and ondansetron  were administered for pain and nausea, with pain score decreasing slightly but nausea response still pending.    diphenhydrAMINE  was given multiple times for allergy  symptoms, with response assessments still pending.    Family remained at bedside throughout the shift, and comfort and safety interventions were consistently maintained.    Patient declined further pain intervention later in the shift.    Overall, pain and allergy  symptoms required repeated interventions, and comfort and safety measures were upheld.     Optimal Comfort and Wellbeing: Pain in the abdomen decreased slightly from 9 to 8 after HYDROmorphone  was given, but remained constant and ongoing; patient declined further intervention later in the shift. Family was present throughout, and comfort measures such as environmental modification and low bed position were maintained.     Rounds/Family Conference: Family was at bedside and visiting consistently throughout the shift, with updates provided as needed.     Absence of Allergy  Symptoms: diphenhydrAMINE  was administered three times for allergy  symptoms, but response assessments were still pending at the end of the shift.     Optimal Pain Control and Function: HYDROmorphone  was administered for abdominal pain, resulting in a slight decrease in pain score; pain remained sharp and aching, and patient declined further intervention.     Nausea and Vomiting Relief: ondansetron  was given for nausea, but response assessment was still pending at the end of the shift.

## 2024-02-23 NOTE — Progress Notes (Signed)
 Pain Psychology Contact Note:        Patient: [name] [name] ([id number])   Supervising Clinical Psychologist: Greig Holland, PhD   Psychology Fellow: Asberry Tribbey, PhD   Date: February 23, 2024   Time: 60 minutes   Method: Inpatient face-to-face      Ms. Fodge a 23 year old White woman with PMH significant [name] syndrome s/p colectomy, desmoid tumor, anemia, and severe protein-calorie malnutrition. She is followed in the outpatient setting by pain anesthesiologist, Arvella Daring, MD, for chronic abdominal and right shoulder pain. She is known to this provider from a previous admit for GI bleed (10/13/2023). She was re-admitted on 01/28/24 for GI bleed. She is followed regularly by CL psychology who this provider coordinated with prior to this visit. Our team is following her now to provide supportive psychotherapy as she remains inpatient.      Objective and Behavioral Observations:   Leilany was sitting up in bed when the psychology fellow entered the room. Her mother was present and stayed for the entirety of the visit today. Izabellah provided updates on her medical course this admission and pain coping when inpatient. She reported that she has largely been managing her pain well although acknowledged impact of ongoing anxiety and stress on pain coping. She detailed some of these stressors today, largely around health and school-related demands. She is anticipating discharge home soon this week and has struggled with pacing activity while in school. Explored some more rigid thinking around how she approaches school demands and pacing and introduced opportunities for more flexible thinking and reframing. Reinforced importance of pacing activity after discharge and highlighted use of adaptive coping strategies for pain. CBT and supportive psychotherapy employed throughout. Clarified role of pain psychology in her care given that she is already followed by CL psychologist regularly. Eliyah is agreeable to inpatient follow-up for pain specific psychotherapy. She may be discharged before I can follow-up with her on Thursday, so majority of session today spent on exploring and planning strategies for pain coping after she is discharged.     Mental Status Exam:   Appearance: No concerns noted   Motor: No concerns noted   Speech/language: No concerns noted      Assessment:   Ms. Tompson was engaged throughout the entirety of the session. Her mood was anxious with congruent affect. She endorsed anxiety and ongoing pain that appears to be well-managed with current pain regimen and coping strategies. She is open to pacing and small goal setting after discharge. She denied SI/HI. She feels supported by family and her medical team, and she expressed interest in continued follow-up with our team while inpatient.      Plan: Asberry will follow-up with Jaren as she remains inpatient for pain specific psychotherapy.      Asberry Waverly, PhD   Psychology Postdoctoral Fellow     Greig Holland, PhD

## 2024-02-23 NOTE — Progress Notes (Signed)
 Daily Progress Note    Assessment/Plan:    [name] [name] is a 23 y.o. female with [name] and desmoid fibromatosis s/p proctocolectomy with ileoanal anastomosis that presented to [facility] with GI bleed.    Acute on chronic LGI bleeding - Iron  Deficiency: Increased bleeding and pain for the past ~two weeks in the setting of prior colectomy and ileoanal anastamosis as below. Has had prior ulcers affecting her J pouch and history of prior GIBs. EGD and pouchoscopy 11/21 noted multiple (30-50) gastric polyps, no specimens collected and no evidence of pouchitis.  Biopsy did reveal adenoma (not high grade) that will need resection. She continues to have frequent BMs (>10/day) and Hgb has remained stable in 9s - 10. She has reported worsening abdominal pain/sense of distention and pulling lower abd and now epigastric and right flank. CT enterography 12/6 with mild asymmetric thickening of the small bowel leading into the ileoanal anastomosis which may be exaggerated due to under distention and a few additional mildly thickened small bowel loops in the lower abdomen and pelvis which could be seen with enteritis. CTA on 12/11 with normal SMA and IMA. Moderate stenosis of celiac trunk likely a red herring.  - With ongoing bleeding, discussed transfer to [facility] for double balloon endoscopy - [facility] declined on 12/1 and requests outpatient follow up instead end of December  - IV ondansetron  4 mg Q6hr PRN, scopolamine  patch, and prn PO promethazine   - continue diphenoxylate -atropine  (Lomotil ) qid with prn loperamide  ordered  - Multidisciplinary discussion with GI (inpatient and outpatient providers) and oncology regarding TPN for bowel rest. This has been attempted in the past without impact on bleeding or abdominal pain. Continue enteral feeding    Iron  def anemia from chronic blood loss - S/p IV iron  dextran on 11/22 and 12/9. She has reactions to iron . See Epic flag for pre and post-treatment for iron  infusions  - Continue home oral hydrocortisone  10 mg p.o. daily    - Continue IV diphenhydramine  for now. Typically stays on IV diphenhydramine  and hydromorphone  until she leaves  - Restarted scheduled cetirizine  given hives 12/13  - Hold on further IV lasix  today     Desmoid Fibromatosis s/p Proctocolectomy with Ileoanal anastomosis  - Familial Adenomatous Polyposis - Concern for Breast Lump Follows with Dr. Loris. Most recently trialed on nirogacestat , though recently, this has been held due to diarrhea and bleeding.  - Oncology aware of admission but primary oncology is out. Will need to discuss nirogacestat , doxil, or sorafenib  when he returns  - Breast US  obtained on 11/22 and did not show evidence of fluid collections or infection, but a 'full diagnostic evaluation' would need to be considered. Oncology recommended a breast MRI to evaluate (ordered)     Acute on Chronic Pain: Follows with [facility], primary is Vinie Daring, [place]. Per pain plan:  - Continue home Butrans  20 mcg/hr patch q7 days  - Oxycodone  liquid 20 mg Q4hr PRN  - IV hydromorphone  1 mg to Q6hr PRN  - Baclofen  5mg  TID  - Inbox message to Dr. Vinie Lennox) Daring on discharge for f/u     Mild adrenal insufficiency: Tentative diagnosis during last admission  - Continue home hydrocortisone  10 mg daily 12/16     GERD:  - Continue home famotidine  20 mg nightly  - Continue pantoprazole  40 mg p.o. bid    Poor PO Intake  - RD involved continue to encourage PO intake and supplementation     Depression/Anxiety:  - Appreciate  Psychology recs  - Continue home escitalopram  5 mg daily   - Continue home lorazepam  0.5 mg PRN  - Palliative care consult for psychosocial support inpatient and outpatient    Disposition: Home    DVT Prophylaxis: encourage ambulation  Diet: Regular     Issues Impacting Complexity of Management:  -Parenteral controlled medications: IV Dilaudid     Medical Decision Making: Discussed the patient's management and/or test interpretation with primary pain physician as summarized within this note    I personally spent more than 50 minutes in chart review, discussing and coordinating plan of care with consults / nursing / patient and/or family.    __________________________________________________________________    Subjective:    No acute events overnight. Patient reports she had an episode of her heart rate rising to the 130s when ambulating last night as well as issues with waking up covered in sweat. She does feel like she is easily winded and is coughing. Denies new urinary symptoms.        Objective    Temp:  [36.7 ??C (98.1 ??F)-36.8 ??C (98.2 ??F)] 36.7 ??C (98.1 ??F)  Pulse:  [76-100] 88  Resp:  [16-18] 16  BP: (100-129)/(65-72) 100/70  SpO2:  [97 %-100 %] 100 %    General: No acute distress.  Appears stated age.    HEENT: MMM, oropharynx clear  Cardiac: RRR, no M/R/G  Pulmonary: Normal work of breathing, no wheezes or crackles  Skin: No jaundice. No rashes or lesions.  Extremities: No edema, cyanosis, or clubbing. Warm  Neuro: CN 2-12 intact, no gross motor deficits  Psych: Oriented to self, place, and time. Normal speech and affect

## 2024-02-24 MED ADMIN — acetaminophen (TYLENOL) tablet 1,000 mg: 1000 mg | ORAL | @ 14:00:00

## 2024-02-24 MED ADMIN — acetaminophen (TYLENOL) tablet 1,000 mg: 1000 mg | ORAL | @ 06:00:00

## 2024-02-24 MED ADMIN — acetaminophen (TYLENOL) tablet 1,000 mg: 1000 mg | ORAL

## 2024-02-24 MED ADMIN — pantoprazole (Protonix) EC tablet 40 mg: 40 mg | ORAL | @ 12:00:00

## 2024-02-24 MED ADMIN — pantoprazole (Protonix) EC tablet 40 mg: 40 mg | ORAL

## 2024-02-24 MED ADMIN — diphenhydrAMINE (BENADRYL) injection: 25 mg | INTRAVENOUS | @ 10:00:00

## 2024-02-24 MED ADMIN — diphenhydrAMINE (BENADRYL) injection: 25 mg | INTRAVENOUS | @ 20:00:00

## 2024-02-24 MED ADMIN — diphenhydrAMINE (BENADRYL) injection: 25 mg | INTRAVENOUS | @ 01:00:00

## 2024-02-24 MED ADMIN — diphenhydrAMINE (BENADRYL) injection: 25 mg | INTRAVENOUS

## 2024-02-24 MED ADMIN — diphenhydrAMINE (BENADRYL) injection: 25 mg | INTRAVENOUS | @ 05:00:00

## 2024-02-24 MED ADMIN — diphenhydrAMINE (BENADRYL) injection: 25 mg | INTRAVENOUS | @ 14:00:00

## 2024-02-24 MED ADMIN — cetirizine (ZYRTEC) tablet 20 mg: 20 mg | ORAL | @ 01:00:00

## 2024-02-24 MED ADMIN — cetirizine (ZYRTEC) tablet 20 mg: 20 mg | ORAL | @ 14:00:00 | Stop: 2024-02-24

## 2024-02-24 MED ADMIN — ondansetron (ZOFRAN) injection 4 mg: 4 mg | INTRAVENOUS

## 2024-02-24 MED ADMIN — ondansetron (ZOFRAN) injection 4 mg: 4 mg | INTRAVENOUS | @ 14:00:00

## 2024-02-24 MED ADMIN — famotidine (PEPCID) tablet 20 mg: 20 mg | ORAL | @ 01:00:00

## 2024-02-24 MED ADMIN — hydrocortisone (CORTEF) tablet 10 mg: 10 mg | ORAL | @ 14:00:00

## 2024-02-24 MED ADMIN — oxyCODONE (ROXICODONE) 5 mg/5 mL solution 20 mg: 20 mg | ORAL | Stop: 2024-02-29

## 2024-02-24 MED ADMIN — oxyCODONE (ROXICODONE) 5 mg/5 mL solution 20 mg: 20 mg | ORAL | @ 20:00:00 | Stop: 2024-02-29

## 2024-02-24 MED ADMIN — diphenoxylate-atropine (LOMOTIL) 2.5-0.025 mg per tablet 1 tablet: 1 | ORAL | @ 01:00:00

## 2024-02-24 MED ADMIN — diphenoxylate-atropine (LOMOTIL) 2.5-0.025 mg per tablet 1 tablet: 1 | ORAL | @ 12:00:00

## 2024-02-24 MED ADMIN — escitalopram oxalate (LEXAPRO) tablet 5 mg: 5 mg | ORAL | @ 14:00:00

## 2024-02-24 MED ADMIN — HYDROmorphone (PF) (DILAUDID) injection 1 mg: 1 mg | INTRAVENOUS | @ 12:00:00 | Stop: 2024-02-29

## 2024-02-24 MED ADMIN — HYDROmorphone (PF) (DILAUDID) injection 1 mg: 1 mg | INTRAVENOUS | @ 18:00:00 | Stop: 2024-02-29

## 2024-02-24 MED ADMIN — HYDROmorphone (PF) (DILAUDID) injection 1 mg: 1 mg | INTRAVENOUS | @ 04:00:00 | Stop: 2024-02-24

## 2024-02-24 MED ADMIN — sodium chloride 0.9% (NS) bolus 250 mL: 250 mL | INTRAVENOUS | @ 20:00:00 | Stop: 2024-02-24

## 2024-02-24 MED ADMIN — oxyCODONE (ROXICODONE) 5 mg/5 mL solution 20 mg: 20 mg | ORAL | @ 10:00:00 | Stop: 2024-03-01

## 2024-02-24 MED ADMIN — oxyCODONE (ROXICODONE) 5 mg/5 mL solution 20 mg: 20 mg | ORAL | @ 14:00:00 | Stop: 2024-03-01

## 2024-02-24 MED ADMIN — oxyCODONE (ROXICODONE) 5 mg/5 mL solution 20 mg: 20 mg | ORAL | @ 05:00:00 | Stop: 2024-03-01

## 2024-02-24 MED ADMIN — scopolamine (TRANSDERM-SCOP) 1 mg over 3 days topical patch 1 mg: 1 | TOPICAL | @ 06:00:00

## 2024-02-24 MED ADMIN — sodium chloride 0.9% (NS) bolus 250 mL: 250 mL | INTRAVENOUS | @ 16:00:00 | Stop: 2024-02-24

## 2024-02-24 NOTE — Plan of Care (Signed)
 Shift Summary  Pain was managed with PRN HYDROmorphone  and oxyCODONE , resulting in temporary decreases in pain scores, but pain remained moderate to severe and patient declined further interventions.   PRN diphenhydrAMINE  was given for nausea with improving response noted after initial doses.   Family was present at bedside and updated throughout the shift, supporting psychosocial wellbeing.   Safety and comfort interventions, including environmental modifications and fall precautions, were consistently maintained.   Overall, pain and nausea required repeated interventions and comfort was supported through both pharmacologic and non-pharmacologic measures.     Optimal Comfort and Wellbeing: Comfort interventions such as pain management and environmental modifications were provided, but pain scores fluctuated between 6 and 9 throughout the shift despite PRN analgesics and non-pharmacologic support; patient declined additional interventions. Family presence and safety measures were consistently maintained to support overall wellbeing.     Rounds/Family Conference: Family was present at bedside and updated throughout the shift, with ongoing visitation documented at regular intervals.     Optimal Pain Control and Function: Pain scores varied, with temporary decreases after PRN HYDROmorphone  and oxyCODONE , but pain remained moderate to severe and patient declined further interventions. Pain was described as both acute and chronic.     Nausea and Vomiting Relief: PRN diphenhydrAMINE  was administered multiple times with improving response noted after initial doses, but the last assessment was pending results.

## 2024-02-24 NOTE — Progress Notes (Signed)
 Daily Progress Note    Assessment/Plan:    [name] [name] is a 23 y.o. female with [name] and desmoid fibromatosis s/p proctocolectomy with ileoanal anastomosis that presented to [facility] with GI bleed.    Acute on chronic LGI bleeding - Iron  Deficiency: Increased bleeding and pain for the past ~two weeks in the setting of prior colectomy and ileoanal anastamosis as below. Has had prior ulcers affecting her J pouch and history of prior GIBs. EGD and pouchoscopy 11/21 noted multiple (30-50) gastric polyps, no specimens collected and no evidence of pouchitis.  Biopsy did reveal adenoma (not high grade) that will need resection. She continues to have frequent BMs (>10/day) and Hgb has remained stable in 9s - 10. She has reported worsening abdominal pain/sense of distention and pulling lower abd and now epigastric and right flank. CT enterography 12/6 with mild asymmetric thickening of the small bowel leading into the ileoanal anastomosis which may be exaggerated due to under distention and a few additional mildly thickened small bowel loops in the lower abdomen and pelvis which could be seen with enteritis. CTA on 12/11 with normal SMA and IMA. Moderate stenosis of celiac trunk likely a red herring.  - With ongoing bleeding, discussed transfer to [facility] for double balloon endoscopy - [facility] declined on 12/1 and requests outpatient follow up instead end of December  - IV ondansetron  4 mg Q6hr PRN, scopolamine  patch, and prn PO promethazine   - continue diphenoxylate -atropine  (Lomotil ) qid with prn loperamide  ordered  - Multidisciplinary discussion with GI (inpatient and outpatient providers) and oncology regarding TPN for bowel rest. This has been attempted in the past without impact on bleeding or abdominal pain. Continue enteral feeding  - Outpatient follow up with oncology to discuss ongoing therapies re: Desmoid tumors    Volume Status - Patient is likely in a narrow therapeutic window regarding her volume status. Feels like she maybe got dehydrated after the recent doses of IV lasix .  Has been trying to rehydrate PO but reports absorption issues. She has been more on the sinus tach side on telemetry. 250mL bolus x 2 today   - Caution with IV fluids. Would recommend 250cc increments with reassessment to avoid increased swelling attributed to IVF     Iron  def anemia from chronic blood loss - S/p IV iron  dextran on 11/22 and 12/9. She has reactions to iron . See Epic flag for pre and post-treatment for iron  infusions  - Continue home oral hydrocortisone  10 mg p.o. daily    - Continue IV diphenhydramine  for now. 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 Loris. Most recently trialed on nirogacestat , though recently, this has been held due to diarrhea and bleeding.  - Oncology aware of admission but primary oncology is out. Will need to discuss nirogacestat , doxil, or sorafenib  when he returns  - Breast US  obtained on 11/22 and did not show evidence of fluid collections or infection, but a 'full diagnostic evaluation' would need to be considered. Oncology recommended a breast MRI to evaluate (ordered)     Acute on Chronic Pain: Follows with [facility] management clinic, primary is Vinie Daring, [place]. Per pain plan:  - Continue home  Butrans  20 mcg/hr patch q7 days  - Oxycodone  liquid 20 mg Q4hr PRN  - IV hydromorphone  1 mg to Q6hr PRN  - Baclofen  5mg  TID  - Inbox message to Dr. Vinie Lennox) Delores on discharge for f/u     Mild adrenal insufficiency: Tentative diagnosis during last admission  - Continue home hydrocortisone  10 mg daily 12/16     GERD:  - Continue home famotidine  20 mg nightly  - Continue pantoprazole  40 mg p.o. bid    Poor PO Intake  - RD involved continue to encourage PO intake and supplementation     Depression/Anxiety:  - Appreciate Psychology recs  - Continue home escitalopram  5 mg daily   - Continue home lorazepam  0.5 mg PRN  - Palliative care consult for psychosocial support inpatient and outpatient    Disposition: Home    DVT Prophylaxis: encourage ambulation  Diet: Regular     Issues Impacting Complexity of Management:  -Parenteral controlled medications: IV Dilaudid       I personally spent more than 50 minutes in chart review, discussing and coordinating plan of care with consults / nursing / patient and/or family.    __________________________________________________________________    Subjective:    No acute events overnight. Patient feels she is dehydrated and is concerned about sinus tachycardia.      Objective    Temp:  [36.6 ??C (97.8 ??F)-36.8 ??C (98.2 ??F)] 36.6 ??C (97.8 ??F)  Pulse:  [86-140] 99  Resp:  [18] 18  BP: (101-110)/(55-91) 106/61  SpO2:  [97 %-99 %] 97 %    General: No acute distress.  Appears stated age.    HEENT: MMM, oropharynx clear  Cardiac: RRR, no M/R/G  Pulmonary: Normal work of breathing, no wheezes or crackles  Skin: No jaundice. No rashes or lesions.  Extremities: No edema, cyanosis, or clubbing. Warm  Neuro: CN 2-12 intact, no gross motor deficits  Psych: Oriented to self, place, and time. Normal speech and affect

## 2024-02-24 NOTE — Consults (Addendum)
 [facility])   Consultation - Liaison (CL) Psychiatry  CL Psychology Follow-Up Note      Service Date:  02/24/24  Admit Date:  01/28/24  Clinician:  Olam Littles, PsyD  Intervention: 45 min Individual Psychotherapy  Face-to-Face Clinical Contact with Patient: 50 min  Pre-/Post-Encounter Activities: 15 min  Method of Interaction: In-Person    BACKGROUND INFORMATION AND REASON FOR REFERRAL:  Please see H&P for full details. 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 She was admitted to the Hospitalist service for management of the concerns above. Pt is well-known to clinical research associate from previous admissions. Please see prior notes for further details on psychosocial history. Psychology consulted to address psychological factors affecting Pt's medical condition.     Interval events:  -No adverse behavioral/psychiatric events since last seen by Psychology     ASSESSMENT  IMPRESSIONS/SUMMARY    At follow-up, Pt's mood appeared modestly improved. She remains anxious about the interplay between her medical situation and education and is hopeful to discharge soon. Session topics included perspective-taking to help Pt maintain compassionate self-talk. Pt actively engaged and receptive to intervention.     DIAGNOSTIC IMPRESSIONS    Major depressive disorder, recurrent episode, moderate  Generalized anxiety disorder  Posttraumatic stress disorder    Risk Assessment:  ASQ screening result: no intervention is necessary    -A full risk assessment was previously performed on 11/21.  Risk assessment remains essentially unchanged.    Current suicide risk: low risk  Current homicide risk: low risk      PLAN  RECOMMENDATIONS    ## Safety and Observation Level:   -This patient is not currently under IVC. If safety concerns arise, please page psychiatry for an evaluation. Recommend routine level of observation per primary team.    ## Follow-up:  - The patient desires ongoing follow-up from CL Psychology while medically inpatient.    ## Disposition:  -There are no psychological contraindications to discharging this patient when medically appropriate.   -Pt interested in scheduling outpatient therapy appointment with writer. Appointment coordinated for 1/15 at 9AM.  - When this patient is discharged, please ensure that their AVS includes information about the 72 Suicide & Crisis Lifeline.    ## Behavioral/environmental:   -Please adhere to delirium (prevention) protocol.   -Efforts to encourage normal circadian function are recommended.  Specifically, exposure to natural light during the day, minimization of light at night, consistent/routine times for sleep/wake, and avoiding of daytime naps (if possible) may be helpful.   -Please consolidate care during the night when feasible.    Olam GORMAN Littles, PsyD  Clinical Psychologist       SUBJECTIVE  SESSION CONTENT     Session began with a brief check-in. Pt recounted recent updates. She shared she is frustrated/anxious about remaining in the hospital and is hopeful she will be able to discharge soon. Pt noted she has still not heard back from one professor about whether she can have accommodations for her final exam. Pt provided space to process her emotions, which were validated and normalized. Utilized perspective-taking exercises to assist Pt with maintaining healthy, compassionate self-talk.     OBJECTIVE   MENTAL STATUS     Orientation and consciousness: AOx4   Appearance: Appears stated age  Behavior: Calm, Cooperative, Direct eye contact, and Polite  Speech: Within normal limits  Language: Intact  Mood: Anxious  Affect: Full and Mood congruent  Perceptual disturbance (hallucinations, illusions): None  Thought process and association: Linear and coherent  Thought content (delusions, obsessions etc.): None  Insight: Fair  Judgement: Fair  Memory: WNL, although not formally assessed     EDUCATION/INTERVENTIONS:    -Completed mood check-in and reviewed the patient's current stressors and strategies to manage stress.  -Cognitive behavioral therapy  -Discussed the patient's treatment goals, revisited discussion of treatment options, and  explored goals of care.  -Continued to discuss pertinent findings with Pt's care team

## 2024-02-24 NOTE — Plan of Care (Signed)
 Shift Summary  Patient is alert and oriented ?? 4. On room air. Vital signs remain at baseline. Patient independent. Port site saline lock. Dressing clean, dry, and intact. Fall precautions maintained. Call bell within reach.   Patient received IVF bolus. Tolerated well. Patient voiced overall felling better.  Pain remained persistent and high despite multiple administrations of pain medications and some interventions were declined.   Fall prevention measures were maintained throughout the shift, with no falls or injuries reported.   Nausea was treated with ondansetron , but loss of appetite and nausea continued  Overall, pain and GI symptoms persisted, and safety interventions were consistently applied.    Optimal Pain Control and Function: Abdominal pain remained constant and aching throughout the shift, with pain scores fluctuating between 6 and 8; pain medications were administered at 9:28 AM, 12:43 PM, and 2:35 PM, but pain relief  was limited as scores only decreased slightly by the end of the shift and interventions were declined multiple times.    Absence of Fall and Fall-Related Injury: Fall prevention strategies were consistently maintained, including environmental modifications, bed alarms, and hourly visual checks, with no falls or injuries documented during the shift.    Nausea and Vomiting Relief: Ondansetron  was administered at 9:27 AM for nausea, and loss of appetite and nausea persisted throughout the morning; diphenoxylate -atropine  was refused at 11:37 AM.    Improved Nutritional Intake: Nutrition remained probably inadequate and loss of appetite continued, with nonirritating oral fluids promoted but no improvement in intake noted.

## 2024-02-25 DIAGNOSIS — G894 Chronic pain syndrome: Principal | ICD-10-CM

## 2024-02-25 DIAGNOSIS — R1084 Generalized abdominal pain: Principal | ICD-10-CM

## 2024-02-25 LAB — BASIC METABOLIC PANEL
ANION GAP: 9 mmol/L (ref 5–14)
BLOOD UREA NITROGEN: 13 mg/dL (ref 9–23)
BUN / CREAT RATIO: 22
CALCIUM: 8.6 mg/dL — ABNORMAL LOW (ref 8.7–10.4)
CHLORIDE: 103 mmol/L (ref 98–107)
CO2: 31 mmol/L (ref 20.0–31.0)
CREATININE: 0.6 mg/dL (ref 0.55–1.02)
EGFR CKD-EPI (2021) FEMALE: >90 mL/min/1.73m2 (ref >=60)
GLUCOSE RANDOM: 93 mg/dL (ref 70–179)
POTASSIUM: 3.8 mmol/L (ref 3.4–4.8)
SODIUM: 143 mmol/L (ref 135–145)

## 2024-02-25 LAB — CBC
HEMATOCRIT: 30.5 % — ABNORMAL LOW (ref 34.0–44.0)
HEMOGLOBIN: 10.3 g/dL — ABNORMAL LOW (ref 11.3–14.9)
MEAN CORPUSCULAR HEMOGLOBIN CONC: 33.8 g/dL (ref 32.0–36.0)
MEAN CORPUSCULAR HEMOGLOBIN: 29.9 pg (ref 25.9–32.4)
MEAN CORPUSCULAR VOLUME: 88.4 fL (ref 77.6–95.7)
MEAN PLATELET VOLUME: 9.5 fL (ref 6.8–10.7)
PLATELET COUNT: 246 10*9/L (ref 150–450)
RED BLOOD CELL COUNT: 3.44 10*12/L — ABNORMAL LOW (ref 3.95–5.13)
RED CELL DISTRIBUTION WIDTH: 19.1 % — ABNORMAL HIGH (ref 12.2–15.2)
WBC ADJUSTED: 8.9 10*9/L (ref 3.6–11.2)

## 2024-02-25 LAB — MAGNESIUM: MAGNESIUM: 2.1 mg/dL (ref 1.6–2.6)

## 2024-02-25 LAB — VITAMIN B12: VITAMIN B-12: 350 pg/mL (ref 211–911)

## 2024-02-25 MED ADMIN — acetaminophen (TYLENOL) tablet 1,000 mg: 1000 mg | ORAL | @ 07:00:00

## 2024-02-25 MED ADMIN — acetaminophen (TYLENOL) tablet 1,000 mg: 1000 mg | ORAL | @ 15:00:00

## 2024-02-25 MED ADMIN — pantoprazole (Protonix) EC tablet 40 mg: 40 mg | ORAL | @ 23:00:00

## 2024-02-25 MED ADMIN — pantoprazole (Protonix) EC tablet 40 mg: 40 mg | ORAL | @ 12:00:00

## 2024-02-25 MED ADMIN — diphenhydrAMINE (BENADRYL) injection: 25 mg | INTRAVENOUS | @ 04:00:00

## 2024-02-25 MED ADMIN — diphenhydrAMINE (BENADRYL) injection: 25 mg | INTRAVENOUS | @ 23:00:00

## 2024-02-25 MED ADMIN — diphenhydrAMINE (BENADRYL) injection: 25 mg | INTRAVENOUS | @ 12:00:00

## 2024-02-25 MED ADMIN — diphenhydrAMINE (BENADRYL) injection: 25 mg | INTRAVENOUS | @ 08:00:00

## 2024-02-25 MED ADMIN — diphenhydrAMINE (BENADRYL) injection: 25 mg | INTRAVENOUS | @ 17:00:00

## 2024-02-25 MED ADMIN — ondansetron (ZOFRAN) injection 4 mg: 4 mg | INTRAVENOUS | @ 23:00:00

## 2024-02-25 MED ADMIN — ondansetron (ZOFRAN) injection 4 mg: 4 mg | INTRAVENOUS | @ 07:00:00

## 2024-02-25 MED ADMIN — famotidine (PEPCID) tablet 20 mg: 20 mg | ORAL | @ 02:00:00

## 2024-02-25 MED ADMIN — hydrocortisone (CORTEF) tablet 10 mg: 10 mg | ORAL | @ 12:00:00

## 2024-02-25 MED ADMIN — sodium chloride 0.9% (NS) bolus 500 mL: 500 mL | INTRAVENOUS | @ 16:00:00 | Stop: 2024-02-25

## 2024-02-25 MED ADMIN — oxyCODONE (ROXICODONE) 5 mg/5 mL solution 20 mg: 20 mg | ORAL | @ 07:00:00 | Stop: 2024-02-29

## 2024-02-25 MED ADMIN — oxyCODONE (ROXICODONE) 5 mg/5 mL solution 20 mg: 20 mg | ORAL | @ 16:00:00 | Stop: 2024-02-29

## 2024-02-25 MED ADMIN — oxyCODONE (ROXICODONE) 5 mg/5 mL solution 20 mg: 20 mg | ORAL | @ 20:00:00 | Stop: 2024-02-29

## 2024-02-25 MED ADMIN — oxyCODONE (ROXICODONE) 5 mg/5 mL solution 20 mg: 20 mg | ORAL | @ 04:00:00 | Stop: 2024-02-29

## 2024-02-25 MED ADMIN — oxyCODONE (ROXICODONE) 5 mg/5 mL solution 20 mg: 20 mg | ORAL | @ 12:00:00 | Stop: 2024-02-29

## 2024-02-25 MED ADMIN — diphenoxylate-atropine (LOMOTIL) 2.5-0.025 mg per tablet 1 tablet: 1 | ORAL | @ 02:00:00

## 2024-02-25 MED ADMIN — diphenoxylate-atropine (LOMOTIL) 2.5-0.025 mg per tablet 1 tablet: 1 | ORAL | @ 23:00:00

## 2024-02-25 MED ADMIN — diphenoxylate-atropine (LOMOTIL) 2.5-0.025 mg per tablet 1 tablet: 1 | ORAL | @ 18:00:00

## 2024-02-25 MED ADMIN — diphenoxylate-atropine (LOMOTIL) 2.5-0.025 mg per tablet 1 tablet: 1 | ORAL | @ 12:00:00

## 2024-02-25 MED ADMIN — escitalopram oxalate (LEXAPRO) tablet 5 mg: 5 mg | ORAL | @ 15:00:00

## 2024-02-25 MED ADMIN — HYDROmorphone (PF) (DILAUDID) injection 1 mg: 1 mg | INTRAVENOUS | @ 23:00:00 | Stop: 2024-02-29

## 2024-02-25 MED ADMIN — HYDROmorphone (PF) (DILAUDID) injection 1 mg: 1 mg | INTRAVENOUS | @ 02:00:00 | Stop: 2024-02-29

## 2024-02-25 MED ADMIN — HYDROmorphone (PF) (DILAUDID) injection 1 mg: 1 mg | INTRAVENOUS | @ 15:00:00 | Stop: 2024-02-29

## 2024-02-25 MED ADMIN — HYDROmorphone (PF) (DILAUDID) injection 1 mg: 1 mg | INTRAVENOUS | @ 09:00:00 | Stop: 2024-02-29

## 2024-02-25 MED ADMIN — buprenorphine 20 mcg/hour transdermal patch 1 patch: 1 | TRANSDERMAL | @ 07:00:00

## 2024-02-25 MED ADMIN — butalbital-acetaminophen-caffeine (ESGIC) per tablet 1 tablet: 1 | ORAL | @ 19:00:00

## 2024-02-25 MED ADMIN — cetirizine (ZYRTEC) tablet 10 mg: 10 mg | ORAL | @ 15:00:00

## 2024-02-25 MED ADMIN — cetirizine (ZYRTEC) tablet 10 mg: 10 mg | ORAL | @ 02:00:00

## 2024-02-25 MED ADMIN — sodium chloride (NS) 0.9 % infusion: 75 mL/h | INTRAVENOUS | @ 20:00:00 | Stop: 2024-02-26

## 2024-02-25 MED ADMIN — lactated ringers bolus 250 mL: 250 mL | INTRAVENOUS | @ 08:00:00 | Stop: 2024-02-25

## 2024-02-25 NOTE — Treatment Plan (Signed)
 [name] [name] is my clinic patient who is under opioid contract with myself.  She has unfortunately been admitted for the last several weeks.  I have been in contact with her inpatient providers who state that she is likely to discharge tomorrow, 02/26/2024.  The patient last filled oxycodone  on 01/15/2024.  PDMP was reviewed and is appropriate, corroborating this information.  The patient obtained oxycodone  10 mg tablets #180 on 01/15/2024.  This was to allow her to wean down from 20 mg of oxycodone  3 times daily.  She was admitted on 01/27/2024 and should therefore have 18 days of oxycodone  20 mg 3 times daily.  She has been requiring 20 mg oxycodone  every 4 hours for total of 6 doses per day.  She has routinely required this regimen in the outpatient setting with a goal of weaning as quickly as tolerated in the outpatient setting.  Her inpatient team will therefore be prescribing, as per [place] stop act, and with my permission, a 3-day bridge of oxycodone  20 mg every 4 hours as needed.  We have set her up with an appointment in our clinic at the soonest available time slot on 03/09/2024.  Between the discharge prescription and her remaining pills, she should have enough to cover all but 1-2 days of therapy assuming she continues to 20 mg every 4 hours.  I will therefore write a 2-day bridging prescription to be filled no sooner than 03/07/2024 to ensure that she has adequate therapy until we can evaluate her in clinic on 03/09/2024 and then design a wean at that time.    [name] Campbell Delores Mickey, MD  Assistant Professor of Anesthesiology  Department of Anesthesiology   Divisions of General Anesthesiology and Pain Medicine

## 2024-02-25 NOTE — Plan of Care (Signed)
 Shift Summary  Pt A&Ox4 today. Vitals stable on room air. Pt independently ambulates. No acute events this shift.   HYDROmorphone  was administered twice for acute abdominal pain, with pain scores decreasing slightly but remaining elevated throughout the shift.   butalbital -acetaminophen -caffeine  was given for headache in the afternoon, and ondansetron  was administered in the evening.   Patient ambulated outside the room once and was able to feed self independently at all meals.   Laboratory results, including basic metabolic panel and vitamin B12, were within expected ranges for this shift.   Patient maintained skin integrity and nutritional intake, with ongoing abdominal pain managed with PRN medications.

## 2024-02-25 NOTE — Plan of Care (Signed)
 PT A&O x4. On RA. Up ad lib. Family at bedside. Tele and cont pulse ox still ongoing. VSS. PRN dilaudid  given for pain, PRN zofran  given for nausea, and PRN benadryl  given for itching. Safety precautions maintained throughout the shift.   Shift Summary  HYDROmorphone  was administered twice during the shift for persistent abdominal pain, with only temporary reduction in pain scores.   Ondansetron  was given PRN for nausea, with no further vomiting documented after administration.   Fall prevention measures, including frequent toileting and hourly checks, were maintained throughout the shift.   Infection prevention protocols were followed, and no new hospital-acquired complications were documented.   Overall, pain and comfort remained a challenge, but safety and infection prevention goals were met.     Absence of Hospital-Acquired Illness or Injury: Infection prevention measures were consistently maintained throughout the shift, including environmental surveillance, equipment disinfection, and hand hygiene; no new hospital-acquired issues were documented.     Optimal Comfort and Wellbeing: Comfort interventions were limited, and pain remained a challenge with abdominal pain fluctuating but not resolving; patient declined some interventions.     Optimal Pain Control and Function: Abdominal pain persisted at a high level, briefly decreased after intervention but increased again later in the shift; HYDROmorphone  was administered twice for pain management.     Absence of Fall and Fall-Related Injury: Fall prevention strategies were consistently implemented, including frequent toileting, hourly visual checks, and maintaining a safe environment; no falls or injuries occurred.     Nausea and Vomiting Relief: Ondansetron  was administered PRN for nausea, and no further documentation of vomiting was noted after administration.

## 2024-02-25 NOTE — Progress Notes (Signed)
 Pain Psychology Contact Note:        Patient: [name] [name] ([id number])   Supervising Clinical Psychologist: Greig Holland, PhD   Psychology Fellow: Asberry Gilberts, PhD   Date: February 25, 2024   Time: 70 minutes   Method: Inpatient face-to-face      Ms. Primm a 23 year old White woman with PMH significant [name] syndrome s/p colectomy, desmoid tumor, anemia, and severe protein-calorie malnutrition. She is followed in the outpatient setting by pain anesthesiologist, Arvella Daring, MD, for chronic abdominal and right shoulder pain. She is known to this provider from a previous admit for GI bleed (10/13/2023). She was re-admitted on 01/28/24 for GI bleed. She is followed regularly by CL psychology who this provider coordinated with prior to this visit. Our team is following her now to provide supportive psychotherapy as she remains inpatient.      Objective and Behavioral Observations:   Sofiah was sitting up in bed when the psychology fellow entered the room. Her sister and service animal were also present. Pretty shared that she is still anticipating discharge home today or tomorrow. She reported that pain has remained stable and well-controlled. Re-visited cognitions today that are often barriers to pacing activity once she is discharged home. Challenged and reframed some of these together in session. Encouraged collaborative discussions with her care team on outpatient side re pacing, and she is planning to schedule an outpatient visit with her pain specialist, Dr. Daring, after discharge to discuss this more. CBT and supportive psychotherapy employed throughout. Braylie is agreeable to inpatient follow-up for pain specific psychotherapy. She may be discharged before I can follow-up with her next week, however, will plan to continue to follow while she is inpatient.      Mental Status Exam:   Appearance: No concerns noted   Motor: No concerns noted   Speech/language: No concerns noted      Assessment: [name] was engaged throughout the entirety of the session. Her mood was anxious but improved today from previous session. Affect was mood congruent. She endorsed anxiety and ongoing pain that appears to be well-managed with current pain regimen and coping strategies. She is open to pacing and small goal setting after discharge which we continued to discuss today. She denied SI/HI. Encouraged her to continue to follow-up with her regular psychologist outpatient per their plan for ongoing support after discharge. She feels supported by family and her medical team, and she expressed interest in continued follow-up with our team while inpatient.      Plan: Asberry will follow-up with Reyann as she remains inpatient for pain specific psychotherapy.      Asberry [place], PhD   Psychology Postdoctoral Fellow     Greig Holland, PhD

## 2024-02-25 NOTE — Progress Notes (Signed)
 Daily Progress Note    Assessment/Plan:    [name] [name] is a 23 y.o. female with [name] and desmoid fibromatosis s/p proctocolectomy with ileoanal anastomosis that presented to [facility] with GI bleed.    Acute on chronic LGI bleeding - Iron  Deficiency: Increased bleeding and pain for the past ~two weeks in the setting of prior colectomy and ileoanal anastamosis as below. Has had prior ulcers affecting her J pouch and history of prior GIBs. EGD and pouchoscopy 11/21 noted multiple (30-50) gastric polyps, no specimens collected and no evidence of pouchitis.  Biopsy did reveal adenoma (not high grade) that will need resection. She continues to have frequent BMs (>10/day) and Hgb has remained stable in 9s - 10. She has reported worsening abdominal pain/sense of distention and pulling lower abd and now epigastric and right flank. CT enterography 12/6 with mild asymmetric thickening of the small bowel leading into the ileoanal anastomosis which may be exaggerated due to under distention and a few additional mildly thickened small bowel loops in the lower abdomen and pelvis which could be seen with enteritis. CTA on 12/11 with normal SMA and IMA. Moderate stenosis of celiac trunk likely a red herring.  - With ongoing bleeding, discussed transfer to [facility] for double balloon endoscopy - [facility] declined on 12/1 and requests outpatient follow up instead end of December  - IV ondansetron  4 mg Q6hr PRN, scopolamine  patch, and prn PO promethazine   - continue diphenoxylate -atropine  (Lomotil ) qid with prn loperamide  ordered  - Multidisciplinary discussion with GI (inpatient and outpatient providers) and oncology regarding TPN for bowel rest. This has been attempted in the past without impact on bleeding or abdominal pain. Continue enteral feeding  - Outpatient follow up with oncology to discuss ongoing therapies re: Desmoid tumors    Volume Status - Sinus Tachycardia - Patient is likely in a narrow therapeutic window regarding her volume status. Feels like she maybe got dehydrated after the recent doses of IV lasix .  Has been trying to rehydrate PO but reports absorption issues. She has been more on the sinus tach side on telemetry. bolus x 2 12/17, patient still feels dry.  - Will proceed with 500 ml bolus today as she felt this amount was well tolerated yesterday.  Possibly additional bolus pending response to the first    Iron  def anemia from chronic blood loss - S/p IV iron  dextran on 11/22 and 12/9. She has reactions to iron . See Epic flag for pre and post-treatment for iron  infusions  - Continue home oral hydrocortisone  10 mg p.o. daily    - Continue IV diphenhydramine  for now. 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 Loris. Most recently trialed on nirogacestat , though recently, this has been held due to diarrhea and bleeding.  - Oncology aware of admission but primary oncology is out. Will need to discuss nirogacestat , doxil, or sorafenib  when he returns  - Breast US  obtained on 11/22 and did not show evidence of fluid collections or infection, but a 'full diagnostic evaluation' would need to be considered. Oncology recommended a breast MRI to evaluate (ordered)     Acute on Chronic Pain: Follows with [facility], primary is Vinie Daring, Mickey. Per pain plan:  - Continue home Butrans  20 mcg/hr patch q7 days  - Oxycodone  liquid 20 mg Q4hr PRN  - IV hydromorphone  1 mg to Q6hr PRN  - Baclofen  5mg  TID  - Inbox message to Dr. Vinie Lennox) Daring on discharge for f/u     Mild adrenal insufficiency: Tentative diagnosis during last admission  - Continue home hydrocortisone  10 mg daily 12/16     GERD:  - Continue home famotidine  20 mg nightly  - Continue pantoprazole  40 mg p.o. bid    Poor PO Intake  - RD involved continue to encourage PO intake and supplementation     Depression/Anxiety:  - Appreciate Psychology recs  - Continue home escitalopram  5 mg daily   - Continue home lorazepam  0.5 mg PRN  - Palliative care consult for psychosocial support inpatient and outpatient    Disposition: Home    DVT Prophylaxis: encourage ambulation  Diet: Regular     Issues Impacting Complexity of Management:  -Parenteral controlled medications: IV Dilaudid       I personally spent more than 50 minutes in chart review, discussing and coordinating plan of care with consults / nursing / patient and/or family.    __________________________________________________________________    Subjective:    No acute events overnight. Patient feels she is dehydrated and is concerned about sinus tachycardia.      Objective    Temp:  [36.6 ??C (97.8 ??F)-37 ??C (98.6 ??F)] 36.8 ??C (98.2 ??F)  Pulse:  [81-140] 85  Resp:  [16-18] 16  BP: (101-113)/(60-78) 109/67  SpO2:  [97 %-99 %] 98 %    General: No acute distress.  Appears stated age.    HEENT: MMM, oropharynx clear  Cardiac: RRR, no M/R/G  Pulmonary: Normal work of breathing, no wheezes or crackles  Skin: No jaundice. No rashes or lesions.  Extremities: No edema, cyanosis, or clubbing. Warm  Neuro: CN 2-12 intact, no gross motor deficits  Psych: Oriented to self, place, and time. Normal speech and affect

## 2024-02-26 LAB — HEMOGLOBIN: HEMOGLOBIN: 10 g/dL — ABNORMAL LOW (ref 11.3–14.9)

## 2024-02-26 MED ORDER — BISMUTH SUBSALICYLATE 262 MG/15 ML ORAL SUSPENSION
Freq: Four times a day (QID) | ORAL | 0 refills | 4.00000 days | Status: CP | PRN
Start: 2024-02-26 — End: 2024-05-26
  Filled 2024-02-26: qty 237, 2d supply, fill #0

## 2024-02-26 MED ORDER — ZINC OXIDE-COD LIVER OIL 40 % TOPICAL PASTE
Freq: Every day | TOPICAL | 0 refills | 0.00000 days | Status: CP | PRN
Start: 2024-02-26 — End: ?

## 2024-02-26 MED ORDER — ONDANSETRON 8 MG DISINTEGRATING TABLET
ORAL_TABLET | Freq: Three times a day (TID) | 0 refills | 10.00000 days | Status: CP | PRN
Start: 2024-02-26 — End: 2024-05-26
  Filled 2024-02-26: qty 30, 10d supply, fill #0

## 2024-02-26 MED ORDER — BUPRENORPHINE 20 MCG/HOUR WEEKLY TRANSDERMAL PATCH
MEDICATED_PATCH | TRANSDERMAL | 0 refills | 7.00000 days | Status: CP
Start: 2024-02-26 — End: 2024-03-27

## 2024-02-26 MED ORDER — OXYCODONE 20 MG TABLET
ORAL_TABLET | ORAL | 0 refills | 3.00000 days | Status: CP
Start: 2024-02-26 — End: 2024-02-29
  Filled 2024-02-26: qty 18, 3d supply, fill #0

## 2024-02-26 MED ORDER — DIPHENOXYLATE-ATROPINE 2.5 MG-0.025 MG TABLET
ORAL_TABLET | Freq: Four times a day (QID) | ORAL | 0 refills | 14.00000 days | Status: CP
Start: 2024-02-26 — End: 2024-03-11
  Filled 2024-02-26: qty 56, 14d supply, fill #0

## 2024-02-26 MED ADMIN — acetaminophen (TYLENOL) tablet 1,000 mg: 1000 mg | ORAL

## 2024-02-26 MED ADMIN — acetaminophen (TYLENOL) tablet 1,000 mg: 1000 mg | ORAL | @ 15:00:00 | Stop: 2024-02-26

## 2024-02-26 MED ADMIN — pantoprazole (Protonix) EC tablet 40 mg: 40 mg | ORAL | @ 13:00:00 | Stop: 2024-02-26

## 2024-02-26 MED ADMIN — diphenhydrAMINE (BENADRYL) injection: 25 mg | INTRAVENOUS | @ 03:00:00

## 2024-02-26 MED ADMIN — diphenhydrAMINE (BENADRYL) injection: 25 mg | INTRAVENOUS | @ 07:00:00 | Stop: 2024-02-26

## 2024-02-26 MED ADMIN — diphenhydrAMINE (BENADRYL) injection: 25 mg | INTRAVENOUS | @ 17:00:00 | Stop: 2024-02-26

## 2024-02-26 MED ADMIN — diphenhydrAMINE (BENADRYL) injection: 25 mg | INTRAVENOUS | @ 13:00:00 | Stop: 2024-02-26

## 2024-02-26 MED ADMIN — ondansetron (ZOFRAN) injection 4 mg: 4 mg | INTRAVENOUS | @ 17:00:00 | Stop: 2024-02-26

## 2024-02-26 MED ADMIN — ondansetron (ZOFRAN) injection 4 mg: 4 mg | INTRAVENOUS | @ 07:00:00 | Stop: 2024-02-26

## 2024-02-26 MED ADMIN — famotidine (PEPCID) tablet 20 mg: 20 mg | ORAL | @ 02:00:00

## 2024-02-26 MED ADMIN — hydrocortisone (CORTEF) tablet 10 mg: 10 mg | ORAL | @ 13:00:00 | Stop: 2024-02-26

## 2024-02-26 MED ADMIN — oxyCODONE (ROXICODONE) 5 mg/5 mL solution 20 mg: 20 mg | ORAL | @ 15:00:00 | Stop: 2024-02-26

## 2024-02-26 MED ADMIN — oxyCODONE (ROXICODONE) 5 mg/5 mL solution 20 mg: 20 mg | ORAL | @ 05:00:00 | Stop: 2024-02-29

## 2024-02-26 MED ADMIN — oxyCODONE (ROXICODONE) 5 mg/5 mL solution 20 mg: 20 mg | ORAL | @ 10:00:00 | Stop: 2024-02-26

## 2024-02-26 MED ADMIN — oxyCODONE (ROXICODONE) 5 mg/5 mL solution 20 mg: 20 mg | ORAL | Stop: 2024-02-29

## 2024-02-26 MED ADMIN — diphenoxylate-atropine (LOMOTIL) 2.5-0.025 mg per tablet 1 tablet: 1 | ORAL | @ 10:00:00 | Stop: 2024-02-26

## 2024-02-26 MED ADMIN — diphenoxylate-atropine (LOMOTIL) 2.5-0.025 mg per tablet 1 tablet: 1 | ORAL | @ 02:00:00

## 2024-02-26 MED ADMIN — escitalopram oxalate (LEXAPRO) tablet 5 mg: 5 mg | ORAL | @ 13:00:00 | Stop: 2024-02-26

## 2024-02-26 MED ADMIN — HYDROmorphone (PF) (DILAUDID) injection 1 mg: 1 mg | INTRAVENOUS | @ 13:00:00 | Stop: 2024-02-26

## 2024-02-26 MED ADMIN — HYDROmorphone (PF) (DILAUDID) injection 1 mg: 1 mg | INTRAVENOUS | @ 07:00:00 | Stop: 2024-02-26

## 2024-02-26 MED ADMIN — HYDROmorphone (PF) (DILAUDID) injection 1 mg: 1 mg | INTRAVENOUS | @ 17:00:00 | Stop: 2024-02-26

## 2024-02-26 MED ADMIN — cetirizine (ZYRTEC) tablet 10 mg: 10 mg | ORAL | @ 13:00:00 | Stop: 2024-02-26

## 2024-02-26 MED ADMIN — cetirizine (ZYRTEC) tablet 10 mg: 10 mg | ORAL | @ 02:00:00

## 2024-02-26 MED ADMIN — sodium chloride (NS) 0.9 % infusion: 75 mL/h | INTRAVENOUS | @ 03:00:00 | Stop: 2024-02-26

## 2024-02-26 MED ADMIN — HYDROmorphone (DILAUDID) injection Syrg 0.5 mg: .5 mg | INTRAVENOUS | @ 02:00:00 | Stop: 2024-02-25

## 2024-02-26 NOTE — Discharge Summary (Signed)
 Physician Discharge Summary [facility]  7 GENERAL MEDICINE UNIT [facility]  [address]  [place] [address]  Dept: [phone]  Loc: [phone]     Identifying Information:   Adalaide Jaskolski Scantling  2001/02/26  [id number]    Primary Care Physician: Job Lukes, [facility]   Code Status: Full Code    Admit Date: 01/27/2024    Discharge Date: 02/26/2024     Discharge To: Home    Discharge Service: [facility] - Hospitalist [place]     Discharge Attending Physician: Mabel DELENA Hives, MD    Discharge Diagnoses:  Principal Problem:    GI bleed (POA: Yes)  Active Problems:    [name] (familial adenomatous polyposis) (POA: Yes)    [name] (POA: Yes)    Dehydration (POA: Yes)    Iron  deficiency (POA: Yes)  Resolved Problems:    * No resolved hospital problems. *      Outpatient Provider Follow Up Issues:   Outpatient Follow Up:  - Patient to follow up with [facility] for a double balloon endoscopy at the end of December   - Oncology - follow up with [facility] oncology as scheduled  - Psychiatry / Psychology - follow up as scheduled  - Pain management - follow up as scheduled - care coordinated with outpatient provider - 3 day supply of opioid prescribed on discharge  -     Hospital course:    Andreea Chavela Justiniano is a 23 year old female with a history of [name], desmoid tumors, anemia, and prior colectomy with ileoanal anastomosis, who presented with hematochezia, syncopal episodes, and abdominal pain.     Hematochezia - Abdominal pain:  Initial ED labs showed hypernatremia to 154 and a hemoglobin of 10.3. A CT scan showed inflammation at the ileoanal anastomosis, concerning for pouchitis, but no bowel obstruction.  GI was consulted, and EGD and pouchoscopy were performed on 01/29/24. The EGD revealed 30-50 gastric polyps without specimens collected. Pouchoscopy showed rectal cuff congestion, with biopsies obtained showing adenomatous polyp, but no evidence of pouchitis. GI recommended a non-urgent double balloon enteroscopy at [facility] in the future. They also discussed the rectal cuff findings with advanced endoscopy colleagues and mentioned that resection could be performed if needed. Due to continued bleeding with abdominal pain with lower and upper abd with right flank, more protuberant I went ahead and got CT enteroscopy, most tumors were the same, there was evidence of small bowel thickening, looking at the stool with blood it almost looks like bloody string/sheet and then seeing how much the desmoid tumors are throughout the mesentery with mesenteritis wondered if there is dynamic intermittent ischemia when motility occurs. Then reviewed the CT with radiology, and noted the narrowing of SMA significantly, if this also could just be intermittent blood flow restriction could also describe etiology.       Regarding her acute on chronic pain, she follows with [facility] and was maintained on home Butrans  patch, oxycodone  15 mg prn, and baclofen  5 mg prn. She had prn IV Dilaudid  for severe breakthrough pain. Trialed scheduled Lomotil  for abdominal cramps and persistent loose stools >10-20/day. After pain with CT Enteroscopy burning and higher output of stool, used some peptobismol which helped slow it, but sometimes caused more pain due to the need to get it out without colon rearrangement. Using this sparingly. Post oral contrast she had burning with stooling ultimately needed to add Magic Mouthwash and Lidocaine  (viscous) as this helped dec the burning. Due to the constriction of SMA I  discussed with radiology of how to assess, they rec either dynamic US  but we do not have this option, or CT Angio which showed patency of the SMA so mesenteric ischemia is unlikely to be a contributing factor.    Pain control was an issue throughout, and she had a KUB obtained couple of times for ongoing pain and bloating that showed non-specific small bowel distention. Ultimately due to continued symptoms and the explanation of this slow increase over months of pain and bleeding went ahead and got the CT Enteroscopy.     She will follow up closely with her primary outpatient oncologist regarding further therapies for desmoid tumors and will undergo further evaluation with a double-balloon enteroscopy scheduled at [facility] later this month.      She has been discharged with a 3 day supply of opioid pain medications and will follow up with her outpatient pain management specialist.  Will continue with antidiarrheals, prescriptions sent.      Iron  deficiency anemia:  Her Hgb remained relatively stable, and given her iron  deficiency anemia and issues with cutaneous reactions to IV iron  infusion infusion in the past, she was premedicated for the IV iron  infusion in advance and ultimately received it on 01/30/2024.  This was complicated by persistent hives requiring ongoing cetirizine  and IV Benadryl , with hives. Due to continued bleeding remeasured iron  still low, thus decision to give another dose, as she likely would not make it to Push Enteroscopy at end of Dec at [facility]. But she had even worse reaction, about 8-12 hrs post infusion with diffuse swelling, hives, raspy voice, required IV benadry, continued cetirizine  bid throughout, and decided on 12/10 due to continued swelling after getting solumedrol 125 mg overnight, to start IV solumedrol 20 mg bid for 48 hrs.  She weaned the steroids and was eventually able to get back on her home hydrocortiosone.  Hemoglobin stable on discharge.      Sinus Tachycardia - Patient received several doses of lasix  late in her hospital course to assist with swelling after the iron  infusion.  She felt like this ultimately helped the swelling but made her feel dehydrated.  She was treated cautiously with intravenous fluids with improvement in tachycardia and dehydration.      Concern for breast lesions:  Patient noted complaints of a new left breast lesions with ultrasound not suggestive of fluid collections or infection but a 'full diagnostic evaluation' needs to be considered via the Breast Imaging Department as an outpatient.    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  She will follow up closely with them as outpatient.  Desmoid related pain seems to be a significant driving factor in her chronic pain and she will discuss further therapies directed at this on outpatient follow up.       Mild adrenal insufficiency:   Tentative diagnosis during last admission. She was continued on PO hydrocortisone  10mg  daily. Endocrinology plans to follow up with her outpatient. Held while getting steroids but home dose resumed.       GERD:   Continued home famotidine  20 mg nightly, with ppi as it was more difficult trying to reduce acid. Patient complained of acid burning at ostomy site with some relief of topical paste - will continue topical paste on discharge.       Depression/Anxiety:  Continued home Lexapro  5 mg daily and PO Ativan  0.5 mg prn. Touched base with Dr. Antonetta with psychology,  and she was able to follow up with patient while she was admitted.  Generally doing better than in the past and Dr. Antonetta is hopeful to have her visit in the outpatient setting.      ______________________________________________________________________  Discharge Medications:     Your Medication List        PAUSE taking these medications      OGSIVEO  100 mg tablet  Wait to take this until your doctor or other care provider tells you to start again.  Generic drug: nirogacestat   Take 1 tablet (100 mg total) by mouth two (2) times a day. Swallow tablets whole; do not break, crush, or chew.            STOP taking these medications      lidocaine  4 % patch  Commonly known as: ASPERCREME     topiramate  25 MG capsule  Commonly known as: Topamax      tranexamic acid  650 mg Tab tablet            START taking these medications      bismuth  subsalicylate 262 mg/15 mL suspension  Commonly known as: PEPTO BISMOL  Take 30 mL by mouth four (4) times a day as needed.     zinc  oxide-cod liver oil 40 % Pste  Commonly known as: DESITIN 40%  Apply topically daily as needed (buring/irritation).            CHANGE how you take these medications      buprenorphine  20 mcg/hour Ptwk transdermal patch  Place 1 patch on the skin every seven (7) days.  What changed: Another medication with the same name was added. Make sure you understand how and when to take each.     buprenorphine  20 mcg/hour Ptwk transdermal patch  Place 1 patch on the skin every seven (7) days.  What changed: You were already taking a medication with the same name, and this prescription was added. Make sure you understand how and when to take each.     cetirizine  10 MG tablet  Commonly known as: ZYRTEC   Take 1 tablet (10 mg total) by mouth two (2) times a day. After iron  infusions  What changed: Another medication with the same name was removed. Continue taking this medication, and follow the directions you see here.     diphenoxylate -atropine  2.5-0.025 mg per tablet  Commonly known as: LOMOTIL   Take 1 tablet by mouth four (4) times a day for 14 days.  What changed:   when to take this  reasons to take this     oxyCODONE  10 mg immediate release tablet  Commonly known as: ROXICODONE   Take 2 tablets (20 mg total) by mouth every eight (8) hours as needed for pain. DNF: 01/15/24  What changed:   Another medication with the same name was added. Make sure you understand how and when to take each.  Another medication with the same name was changed. Make sure you understand how and when to take each.     oxyCODONE  20 mg immediate release tablet  Commonly known as: ROXICODONE   Take 1 tablet (20 mg total) by mouth every 4 (four) hours for 3 days.  What changed: You were already taking a medication with the same name, and this prescription was added. Make sure you understand how and when to take each.     oxyCODONE  20 mg immediate release tablet  Commonly known as: ROXICODONE   Take 1 tablet (20 mg total) by mouth every four (4)  hours as needed for pain. DNF: 03/07/24  Start taking on: March 07, 2024  What changed:   when to take this  additional instructions  These instructions start on March 07, 2024. If you are unsure what to do until then, ask your doctor or other care provider.            CONTINUE taking these medications      acetaminophen  500 MG tablet  Commonly known as: TYLENOL  EXTRA STRENGTH  Take 2 tablets (1,000 mg total) by mouth every eight (8) hours.     ammonium lactate  12 % lotion  Commonly known as: LAC-HYDRIN   Apply 1 Application topically every hour as needed.     baclofen  10 MG tablet  Commonly known as: LIORESAL   Take 0.5 tablets (5 mg total) by mouth Three (3) times a day.     butalbital -acetaminophen -caffeine  50-325-40 mg per tablet  Commonly known as: ESGIC   Take 1 tablet by mouth two (2) times a day as needed for headache.     escitalopram  oxalate 5 MG tablet  Commonly known as: LEXAPRO   Take 1 tablet (5 mg total) by mouth daily.     famotidine  20 MG tablet  Commonly known as: PEPCID   Take 1 tablet (20 mg total) by mouth nightly.     fluticasone  propionate 50 mcg/actuation nasal spray  Commonly known as: FLONASE   Use 1 spray under the butrans  patch once every 7 days to prevent allergic reaction to the adhesive     hydrocortisone  2.5 % rectal cream  Commonly known as: ANUSOL -HC  AS DIRECTED     hydrocortisone  10 MG tablet  Commonly known as: CORTEF   Take 1 tablet (10 mg total) by mouth every morning.     lidocaine  2 % Soln  Commonly known as: XYLOCAINE   Mix with Children's Benadryl  and Maalox. Swish and spit every 4 hours as needed for mouth pain     lidocaine  5 % ointment  Commonly known as: XYLOCAINE   Apply to affected area daily     loperamide  2 mg capsule  Commonly known as: IMODIUM   Take 1 capsule (2 mg total) by mouth four (4) times a day as needed.     LORazepam  0.5 MG tablet  Commonly known as: ATIVAN   Take 1 tablet (0.5 mg total) by mouth two (2) times a day as needed for anxiety.     naloxegol  12.5 mg tablet  Commonly known as: MOVANTIK   Take 1 tablet (12.5 mg total) by mouth daily as needed (constipation).     naloxone  4 mg/actuation nasal spray  Commonly known as: NARCAN   One spray in either nostril once for known/suspected opioid overdose. May repeat every 2-3 minutes in alternating nostril til EMS arrives     ondansetron  8 MG disintegrating tablet  Commonly known as: ZOFRAN -ODT  Dissolve 1 tablet (8 mg total) in the mouth every eight (8) hours as needed for nausea.     pantoprazole  40 MG tablet  Commonly known as: Protonix   Take 1 tablet (40 mg total) by mouth Two (2) times a day (30 minutes before a meal).     predniSONE  50 MG tablet  Commonly known as: DELTASONE   Take 1 tablet (50 mg total) by mouth two (2) times a day. Take 13 and 7 hours prior to iron  infusion.     promethazine  12.5 MG tablet  Commonly known as: PHENERGAN   Take 1 tablet (12.5 mg total) by mouth every six (6) hours as needed.     scopolamine   1 mg over 3 days  Commonly known as: TRANSDERM-SCOP  Place 1 patch (1 mg total) on the skin every third day.     sucralfate  100 mg/mL suspension  Commonly known as: CARAFATE   Take 10 mL (1 g total) by mouth four (4) times a day.              Allergies:  Adhesive tape-silicones; Ferrlecit  [sodium ferric gluconat-sucrose]; Levofloxacin ; Methylnaltrexone ; Neomycin; Papaya; Morphine; Zosyn [piperacillin-tazobactam]; Adhesive; Compazine [prochlorperazine]; Iron  analogues; Metoclopramide ; Metronidazole ; Reglan  [metoclopramide  hcl]; Iron  dextran; and Latex, natural rubber  ______________________________________________________________________  Pending Test Results (if blank, then none):  Pending Labs       Order Current Status    Vitamin D  25 Hydroxy (25OH D2 + D3) In process            Most Recent Labs:  All lab results last 24 hours -   Recent Results (from the past 24 hours)   Magnesium  Level Collection Time: 02/25/24  1:54 PM   Result Value Ref Range    Magnesium  2.1 1.6 - 2.6 mg/dL   Basic Metabolic Panel    Collection Time: 02/25/24  1:54 PM   Result Value Ref Range    Sodium 143 135 - 145 mmol/L    Potassium 3.8 3.4 - 4.8 mmol/L    Chloride 103 98 - 107 mmol/L    CO2 31.0 20.0 - 31.0 mmol/L    Anion Gap 9 5 - 14 mmol/L    BUN 13 9 - 23 mg/dL    Creatinine 9.39 9.44 - 1.02 mg/dL    BUN/Creatinine Ratio 22     eGFR CKD-EPI (2021) Female >90 >=60 mL/min/1.40m2    Glucose 93 70 - 179 mg/dL    Calcium 8.6 (L) 8.7 - 10.4 mg/dL   Vitamin B12 Level    Collection Time: 02/25/24  6:29 PM   Result Value Ref Range    Vitamin B-12 350 211 - 911 pg/ml   Hemoglobin    Collection Time: 02/26/24  5:28 AM   Result Value Ref Range    HGB 10.0 (L) 11.3 - 14.9 g/dL       Relevant Studies/Radiology (if blank, then none):  ECG 12 Lead  Result Date: 02/26/2024  NORMAL SINUS RHYTHM RSR' PATTERN IN V1 WHEN COMPARED WITH ECG OF 31-Jan-2024 15:05, NONSPECIFIC T WAVE ABNORMALITY, IMPROVED IN ANTEROLATERAL LEADS Confirmed by Haynes Pulling [phone]) on 02/26/2024 6:07:43 AM    XR Chest Portable  Result Date: 02/23/2024  EXAM: XR CHEST PORTABLE ACCESSION: [id number] UN REPORT DATE: 02/23/2024 1:17 PM CLINICAL INDICATION: SHORTNESS OF BREATH  TECHNIQUE: Single View AP Chest Radiograph. COMPARISON: XR CHEST 2 VIEWS 03/25/2023 FINDINGS: New right internal jugular port catheter with tip at the superior cavoatrial junction. Lungs are clear.  No pleural effusion or pneumothorax. Normal heart size and mediastinal contours.     No acute abnormalities.     CT Mesenteric Ischemia  Result Date: 02/18/2024  EXAM: CTA of the abdomen and pelvis ACCESSION: [id number] UN CLINICAL INDICATION: 23 years old with evaluate SMA and perfusion to small bowel if stenosis or function occulsion with mesenteric desmoid tumors and tethering  COMPARISON: 02/13/2024 CT enterography TECHNIQUE: A helical CTA scan was obtained with IV contrast from the lung bases to the symphysis pubis in arterial and venous phases.  Images were reconstructed in the axial plane. Multiplanar reformatted and MIP images were provided for evaluation of the vasculature. FINDINGS: VASCULATURE   Visceral arteries:      Celiac artery and common/proper hepatic,  splenic, and left gastric arteries:  Mild J-hook configuration of the celiac trunk with moderate narrowing at the ostium. There is poststenotic dilation of the downstream portion.      Superior mesenteric artery and jejunal, middle colic, and ileocolic arteries:  Normally patent.      Inferior mesenteric artery and left colic and superior rectal arteries:  Normally patent.      Renal arteries:  Normally patent.   Abdominal aorta and iliac arteries: No high-grade stenosis or aneurysmal dilatation.   Mesenteric and portal veins: Normally patent.   Inferior vena cava and systemic veins: IVC is normally patent. Similar mild narrowing of the left renal vein as it passes between the aorta and SMV. No significant retroperitoneal collaterals. NON-VASCULAR FINDINGS: LOWER CHEST: Unremarkable. LIVER: Normal liver contour. Scattered unchanged subcentimeter parenchymal hypodensities too small to further characterize. BILIARY: Contracted gallbladder limiting intraluminal evaluation. No biliary ductal dilatation.  SPLEEN: Normal in size and contour. PANCREAS: Normal pancreatic contour.  No focal lesions.  No ductal dilation. ADRENAL GLANDS: No adrenal nodule. KIDNEYS/URETERS: Symmetric renal enhancement. No hydronephrosis. No suspicious renal mass. BLADDER: Unremarkable. REPRODUCTIVE ORGANS: Anteverted uterus. Left ovarian follicles/corpus luteal cyst. No suspicious adnexal mass. GI TRACT: Air and fluid dilated loops of distal small bowel in the pelvis measuring up to 4.5 cm in caliber, less pronounced from most recent CT. Similar wall thickening of the distal small bowel loops, appreciated better on recent enterography. Post proctocolectomy with small bowel and ileoanal anastomoses. Small bowel and ileoanal anastomoses anastomosis are narrowed but patent (2:88 and 2:113). PERITONEUM, RETROPERITONEUM AND MESENTERY: No free air or fluid. Redemonstrated ill-defined spiculated central mesenteric mass tethering adjacent small bowel loops remains intervally similar measuring approximately 3.3 x 2.5 cm (5:95). Persistent ill-defined presacral soft tissue. LYMPH NODES: Prominent central mesenteric lymph nodes measuring up to 0.7 cm in short axis BONES and SOFT TISSUES: No suspicious osseous lesions. Ill-defined rectus intramuscular masses remain similar in appearance measuring approximately 2.6 x 1.4 cm (5:123) and 1.3 cm (5:109). Scattered nodular hyperdensities within the abdominal wall, gluteal regions, and left labia subcutaneous tissues.     Moderate stenosis of the celiac trunk ostium, with poststenotic dilation of the downstream portion. Findings can be seen in the setting of median arcuate ligament compression. No evidence of high-grade SMA stenosis.     CT Enterography Abdomen/Pelvis w contrast  Addendum Date: 02/16/2024  Upon further review, there is mild narrowing of the left renal vein as it passes between the aorta and SMV however there is no significant left retroperitoneal collateral vessels to suggest nutcracker syndrome. The celiac axis is also narrowed with a mild J-shaped configuration and mild poststenotic dilatation. Findings could be seen with median arcuate ligament in the appropriate clinical setting. Further characterization with vascular ultrasound or CTA of the abdomen could be considered.    Result Date: 02/16/2024  EXAM: CT ENTEROGRAPHY ABDOMEN/PELVIS W CONTRAST (ENTEROGRAPHY PROTOCOL) ACCESSION: [id number] UN CLINICAL INDICATION: desmoid tumors intraabd, pain, distention, define if upper abd is worsened, pls protocolize like the one in May  COMPARISON: CT abdomen pelvis dated 01/27/2024 TECHNIQUE: A helical CT scan was obtained with IV contrast from the lung bases to the pubic symphysis after administration of neutral oral contrast. Images were reconstructed in the axial plane. Coronal reformatted images were also provided for further evaluation. FINDINGS: LINES AND TUBES: None. LOWER THORAX: Unremarkable. HEPATOBILIARY: Smooth liver contour. Scattered subcentimeter hepatic hypodensities are too small to characterize but similar to prior. Probable focal fatty infiltration along the falciform ligament. The gallbladder  is normal in appearance. No biliary ductal dilatation.  SPLEEN: Normal in size and contour. PANCREAS: No focal masses or ductal dilatation. ADRENALS: No adrenal nodules. KIDNEYS/URETERS AND BLADDER: No hydronephrosis, stones, or solid mass lesions. Previously noted left interpolar hemorrhagic/proteinaceous cyst is not well visualized. Unremarkable urinary bladder. PELVIC/REPRODUCTIVE ORGANS: Anteverted uterus. Multiple left ovarian follicles. GI TRACT: Normally distended stomach. The proximal small bowel loops are under distended. Sequela of proctocolectomy and ileoanal anastomosis. Multiple fluid-filled dilated loops of bowel in the lower abdomen and pelvis. The loop of small bowel leading up to the anastomosis is overall decreased in caliber but with asymmetric wall thickening which is more pronounced compared to prior (2:109). The anastomosis is narrowed but grossly patent. The small bowel loops in the lower abdomen and pelvis appear mildly thickened. No evidence of bowel obstruction. PERITONEUM/RETROPERITONEUM AND MESENTERY: No free air or fluid. Grossly similar appearance of an ill-defined spiculated central mesenteric mass tethering adjacent small bowel loops measuring approximately 3.3 x 2.5 cm (2:92). Similar ill-defined presacral soft tissue. LYMPH NODES: Similar prominent central mesenteric lymph nodes measuring up to 0.9 cm (2:93). VESSELS: The aorta is normal in caliber.  The celiac, SMA, and IMA are patent. No significant calcified atherosclerotic disease. The portal venous system is patent. The IVC is unremarkable. BONES AND SOFT TISSUES: No aggressive osseous lesions. Similar subdermal nodules along the left flank, right lateral abdominal wall, bilateral gluteal regions and left labia. Similar 2.5 x 1.4 cm right inferior rectus intramuscular mass (2:118). A second more ill-defined right rectus intramuscular mass superiorly is grossly unchanged measuring 1.3 cm (2:105).     Since 01/27/2024: -Grossly similar size of the ill-defined central mesenteric mass and right inferior rectus intramuscular masses.  -New mild asymmetric thickening of the small bowel leading into the ileoanal anastomosis which may be exaggerated due to under distention. There are a few additional mildly thickened small bowel loops in the lower abdomen and pelvis which could be seen with enteritis. No evidence of bowel obstruction. -Additional chronic and incidental findings as described in the body of the report.    XR Abdomen 1 View  Result Date: 02/12/2024  EXAM: XR ABDOMEN 1 VIEW ACCESSION: [id number] UN REPORT DATE: 02/12/2024 1:46 PM CLINICAL INDICATION: 23 years old with follow up bowel dilation ; OTHER  COMPARISON: Abdominal radiographs 02/11/2024, CT abdomen pelvis TECHNIQUE: Supine view of the abdomen, 2 image(s) FINDINGS: Nonspecific bowel gas pattern with a minimal amount of stool in the colon. Similar gaseous dilation of small bowel loops in the lower abdomen and pelvis. Suture material projecting over the right and central pelvis. No acute osseous abnormality. The visualized lung bases are clear.     Similar nonspecific gaseous dilation of small bowel loops in the lower abdomen and pelvis.     XR Abdomen 1 View  Result Date: 02/11/2024  EXAM: XR ABDOMEN 1 VIEW ACCESSION: [id number] UN REPORT DATE: 02/11/2024 3:47 PM CLINICAL INDICATION: 23 years old with Abdominal pain, prior history of ileus and intussusception ; OTHER  COMPARISON: CT abdomen pelvis 01/27/2024 TECHNIQUE: Supine view of the abdomen, 2 image(s) FINDINGS: Suture material overlying the pelvis. Moderate gaseous distention of the bowel in the lower abdomen. Relative paucity of small bowel gas in the upper abdomen. No acute osseous abnormality. Lung bases clear.     Moderate gaseous distention of bowel in the lower abdomen is nonspecific and could represent developing small bowel obstruction versus ileus. Suture material overlying the pelvis compatible with history of proctocolectomy with ileoanal anastomosis.  US  Soft Tissue Abdomen  Result Date: 02/08/2024  EXAM: US  SOFT TISSUE ABDOMEN ACCESSION: [id number] UN REPORT DATE: 02/08/2024 9:49 AM CLINICAL INDICATION: 23 years old Female with Rule out recuts sheath hematoma  COMPARISON: CT abdomen pelvis 01/27/2024 TECHNIQUE: Ultrasound views of the region of interest in the right periumbilical abdominal wall were obtained using grayscale and limited color Doppler imaging. FINDINGS: Well-circumscribed hypoechoic lesion within the right rectus abdominis musculature measuring 2.2 x 0.6 x 2.5 cm. The area does not demonstrate internal vascularity. Images of the contralateral side were obtained which were unremarkable.     Similar small indeterminate hypoechoic lesion within the right rectus abdominis musculature measuring up to 2.5 cm without internal vascularity, better evaluated on prior CT.     ECG 12 Lead  Result Date: 02/07/2024  NORMAL SINUS RHYTHM NON-SPECIFIC ST/T WAVE CHANGES ABNORMAL ECG WHEN COMPARED WITH ECG OF 27-Jan-2024 13:47, NO SIGNIFICANT CHANGE WAS FOUND Confirmed by Cleotilde Moccasin ([id number]) on 02/07/2024 12:58:09 PM    US  Breast Limited Left  Result Date: 01/30/2024  EXAM: US  BREAST LIMITED LEFT ACCESSION: [id number] UN CLINICAL INDICATION: 23 years old Female with Nodule, new, marked. History of desmoid tumors. TECHNIQUE: Handheld, targeted ultrasound of the left breast, upper outer quadrant. Static images were retained as part of the permanent medical record. COMPARISON: Prior exam(s) were available and reviewed for comparison. FINDINGS:    Ultrasound of the left upper outer quadrant demonstrates normal-appearing breast parenchymal tissue. There is no focal fluid collection, discrete mass, abnormal shadowing or vascularity.     No discrete sonographic abnormality within the visualized portions of the left upper outer quadrant area of concern. Clinical correlation and further dedicated diagnostic mammographic work-up is recommended in the setting of a new palpable abnormality. Known left chest wall lesions are better seen on prior MRI. RECOMMENDATION: This targeted breast ultrasound examination was performed to evaluate for infection and fluid collections only. To assess for malignancy, a full diagnostic evaluation in the Breast Imaging Department would be necessary. Please order the recommended follow-up and/or imaging as an outpatient and call [phone] for mammogram and ultrasound appointment scheduling. Patients can be referred to breast surgery for clinical evaluation, please call [phone] for new patient appointments.        Pouch Exam  Result Date: 01/29/2024  _______________________________________________________________________________ Patient Name: Natoria Archibald         Procedure Date: 01/29/2024 11:56 AM MRN: [id number]                     Date of Birth: 2001-02-17 Admit Type: Inpatient                 Age: 23 Room: [facility]          Gender: Female Note Status: Finalized                Instrument Name: [id number] _______________________________________________________________________________  Procedure:             Pouchoscopy Indications:           Anastomosis assessment for bleeding Providers:             RODGERS KYM MINOR, MD, GERRIT BRAUN, MD (Fellow),                        JOESPH HINT Referring MD:          Medicines:             Propofol  per Anesthesia Complications:  No immediate complications. _______________________________________________________________________________ Procedure:             Pre-Anesthesia Assessment:                        - Prior to the procedure, a History and Physical was                        performed, and patient medications and allergies were                        reviewed. The patient's tolerance of previous                        anesthesia was also reviewed. The risks and benefits                        of the procedure and the sedation options and risks                        were discussed with the patient. All questions were                        answered, and informed consent was obtained. Prior                        Anticoagulants: The patient has taken no anticoagulant                        or antiplatelet agents. ASA Grade Assessment: III - A                        patient with severe systemic disease. After reviewing                        the risks and benefits, the patient was deemed in                        satisfactory condition to undergo the procedure.                        - Prior Aspirin/ NSAID therapy: The patient has taken                        no aspirin or NSAID medications.                        After obtaining informed consent, the endoscope was                        passed under direct vision. Throughout the procedure,                        the patient's blood pressure, pulse, and oxygen                         saturations were monitored continuously. The Endoscope                        was introduced through the ileoanal anastomosis via  the anus and advanced to the the neo-terminal ileum.                        The procedure was performed without difficulty. The                        patient tolerated the procedure well. The quality of                        the bowel preparation was adequate.                                                                                Findings:      Patient is status-post IPAA.      The ileoanal pouch appeared normal.      There was a rectal cuff beginning at 2 cm from the anal verge,      characterized by congestion, pit pattern changes. Biopsies were taken      with a cold forceps for histology.                                                                                Estimated Blood Loss:      Estimated blood loss was minimal. Impression:            - The ileoanal pouch is normal.                        - Rectal cuff with congestion seen. Biopsied. Recommendation:        - The patient will be observed post-procedure, until                        all discharge criteria are met.                        - Await pathology results.                        - Further recommendations can be made by the GI                        consult team. Please contact them if necessary.                                                                                Procedure Code(s):     --- Professional ---  55613, Endoscopic evaluation of small intestinal pouch                        (eg, Kock pouch, ileal reservoir [S or J]); with                        biopsy, single or multiple Diagnosis Code(s):     --- Professional ---                        Z98.0, Intestinal bypass and anastomosis status CPT copyright 2023 American Medical Association. All rights reserved. The codes documented in this report are preliminary and upon coder review may be revised to meet current compliance requirements. Electronically Signed By Rodgers Minor, MD _________________ RODGERS JAYSON MINOR, MD 01/29/2024 12:35:26 PM The attending physician was present throughout the entire procedure including the insertion, viewing, and removal of the endoscope. This procedure note has been electronically signed by: RODGERS MINOR , MD __________________ GERRIT BRAUN, MD Number of Addenda: 0 Note Initiated On: 01/29/2024 11:56 AM    Upper Endoscopy  Result Date: 01/29/2024  _______________________________________________________________________________ Patient Name: Ragna Kramlich         Procedure Date: 01/29/2024 11:55 AM MRN: [id number]                     Date of Birth: July 03, 2000 Admit Type: Inpatient                 Age: 23 Room: [facility]          Gender: Female Note Status: Finalized                Instrument Name: [id number] _______________________________________________________________________________  Procedure:             Upper GI endoscopy Indications:           Iron  deficiency anemia due to suspected upper                        gastrointestinal bleeding Providers:             RODGERS KYM MINOR, MD, GERRIT BRAUN, MD (Fellow),                        JOESPH RONAL HINT Referring MD:          Medicines:             Propofol  per Anesthesia Complications:         No immediate complications. _______________________________________________________________________________ Procedure:             Pre-Anesthesia Assessment:                        - Prior to the procedure, a History and Physical was                        performed, and patient medications and allergies were                        reviewed. The patient's tolerance of previous                        anesthesia was also reviewed. The risks and benefits  of the procedure and the sedation options and risks                        were discussed with the patient. All questions were                        answered, and informed consent was obtained. Prior                        Anticoagulants: The patient has taken no anticoagulant                        or antiplatelet agents. ASA Grade Assessment: III - A                        patient with severe systemic disease. After reviewing                        the risks and benefits, the patient was deemed in                        satisfactory condition to undergo the procedure.                        - Prior Aspirin/ NSAID therapy: The patient has taken                        no aspirin or NSAID medications.                        After obtaining informed consent, the endoscope was                        passed under direct vision. Throughout the procedure,                        the patient's blood pressure, pulse, and oxygen                         saturations were monitored continuously. The Endoscope                        was introduced through the mouth, and advanced to the                        third part of duodenum. The upper GI endoscopy was                        accomplished without difficulty. The patient tolerated                        the procedure well.  Findings:      The examined esophagus was normal.      About 30-50 4 mm sessile polyps with no bleeding and no stigmata of      recent bleeding were found in the gastric fundus and in the gastric body.      The ampulla and examined duodenum were normal.                                                                                Estimated Blood Loss:      Estimated blood loss was minimal. Impression:            - Normal esophagus.                        - About 30-50 gastric polyps.                        - Normal ampulla and examined duodenum.                        - No specimens collected. Recommendation:        - Return patient to hospital ward for ongoing care.                        - Further recommendations can be made by the GI                        consult team. Please contact them if necessary.                                                                                Procedure Code(s):     --- Professional ---                        [phone], Esophagogastroduodenoscopy, flexible,                        transoral; diagnostic, including collection of                        specimen(s) by brushing or washing, when performed                        (separate procedure) Diagnosis Code(s):     --- Professional ---                        D50.9, Iron  deficiency anemia, unspecified CPT copyright 2023 American Medical Association. All rights reserved. The codes documented in this report are preliminary and upon coder review may be revised to meet current compliance requirements. Electronically Signed By Rodgers Minor, MD _________________ RODGERS  JAYSON MINOR, MD 01/29/2024 12:26:40 PM The attending physician was present throughout the entire procedure including the insertion, viewing, and removal of the endoscope. This procedure note has been electronically signed by: RODGERS MINOR , MD __________________ GERRIT BRAUN, MD Number of Addenda: 0 Note Initiated On: 01/29/2024 11:55 AM    Mammo US  Axillary Left  Result Date: 01/28/2024  EXAM: MAMMO US  AXILLARY LEFT ACCESSION: [id number] UN CLINICAL INDICATION: 23 years old Female with LUQ breast lump, history of numerous desmoid tumors. Patient reports pain TECHNIQUE: Handheld, targeted left axillary ultrasound. Static images were retained as part of the permanent medical record. COMPARISON: Prior exam(s) were available and reviewed for comparison FINDINGS: Sonographic evaluation demonstrates accessory breast tissue to be present within the left axilla and normal-appearing tissue the upper outer quadrant on real-time scanning. Sonographically, no focal fluid collection suspicious mass, abnormal shadowing or adenopathy is identified. Several 1.2 cm less normal-appearing lymph nodes are noted.     ASSESSMENT: BI-RADS Category: 1-ClinicExam : Negative.  Clinical follow up is recommended. Recommendation Laterality: Left Clinical follow up is recommended is recommended for pain. If there is concern of further progression of the left pectoralis lesion that was seen on the chest MRI of 09/22/2023, then follow-up MRI is recommended.     US  Breast Comparison Of Outside Film  Result Date: 01/28/2024  These images were imported for comparison purposes only.  They will not be interpreted and no charges will apply.    US  Breast Comparison Of Outside Film  Result Date: 01/28/2024  These images were imported for comparison purposes only.  They will not be interpreted and no charges will apply.   ______________________________________________________________________  Discharge Instructions:               Follow Up instructions and Outpatient Referrals     Ambulatory referral to Nutrition Services      Reason for referral: Outpatient nutrition education (J pouch)        Appointments which have been scheduled for you      Mar 01, 2024 12:45 PM  (Arrive by 12:30 PM)  MRI Abdomen Pelvis With and Without Contrast with IC MRI RM 2  [facility]) [address]  [address]  [place] [address]  [phone]   On appointment date:  Bring recent lab work  Bring documentation of any metal object implants  Take meds as usual  Check with physician if diabetic  You will be asked to change into a gown for your safety    On appointment date do not:  Consume anything 4 hrs prior to procedure  Wear metallic items including jewelry (we are not responsible for lost items)    Let us  know if patient:  Claustrophobic  Metal object implant  Pregnant  Prescribed a sedative  On dialysis  Allergic to MRI dye/contrast  Kidney Failure       Mar 08, 2024 8:30 AM  (Arrive by 8:00 AM)  AYA INFUSION ONLY with ONCINF CHAIR 58  [facility] ONCOLOGY INFUSION [facility] REG[facility]) [address]  [place] [address]  [phone]        Mar 09, 2024 8:00 AM  (Arrive by 7:30 AM)  RETURN PHARMACIST with Altamese Lamarr Miracle, CPP  [facility] PAIN MANAGEMENT CENTER [address] [facility] REG[facility]) [address]  [address]  [place] [address]  [phone]        Mar 24, 2024  9:00 AM  (Arrive by 8:30 AM)  RETURN VIDEO VISIT MYCHART with Olam Jackquline Littles, PsyD  [facility] CCSP 2ND FLR CANCER HOSP [facility] REGION) [address]  [place] [address]  [phone]   Please sign into My [place] Chart at least 15 minutes before your appointment to complete the eCheck-In process. You must complete eCheck-In before you can start your video visit. We also recommend testing your audio and video connection to troubleshoot any issues before your visit begins. Click ???Join Video Visit??? to complete these checks. Once you have completed eCheck-In and tested your audio and video, click ???Join Call??? to connect to your visit.     For your video visit, you will need a computer with a working camera, speaker and microphone, a smartphone, or a tablet with internet access.    My [place] Chart enables you to manage your health, send non-urgent messages to your provider, view your test results, schedule and manage appointments, and request prescription refills securely and conveniently from your computer or mobile device.    You can go to [email or web address] to sign in to your My [place] Chart account with your username and password. If you have forgotten your username or password, please choose the ???Forgot Username???? and/or ???Forgot Password???? links to gain access. You also can access your My [place] Chart account with the free MyChart mobile app for Android or iPhone.    If you need assistance accessing your My  Chart account or for assistance in reaching your provider's office to reschedule or cancel your appointment, please call [facility] [phone].         Apr 27, 2024 11:00 AM  (Arrive by 10:30 AM)  OFFICE VISIT with Vinie Campbell Delores Mickey., MD  [facility] PAIN MANAGEMENT CENTER [address] [place]) [address]  [address]  [place] [address]  [phone]        May 18, 2024 1:40 PM  (Arrive by 1:25 PM)  RETURN ENDOCRINE with Chiquita Folk, MD  [facility] [place]  [facility] REGION) [address]  [place] [address]  [phone]             ______________________________________________________________________  Discharge Day Services:  BP 115/65  - Pulse 82  - Temp 36.8 ??C (98.2 ??F) (Oral)  - Resp 16  - Ht 165.1 cm (5' 5)  - Wt 59.1 kg (130 lb 4.8 oz)  - LMP 01/18/2024 (Exact Date)  - SpO2 100%  - BMI 21.68 kg/m??   Pt seen on the day of discharge and determined appropriate for discharge.    Condition at Discharge: stable    Length of Discharge: I spent greater than 30 mins in the discharge of this patient.

## 2024-02-26 NOTE — Plan of Care (Signed)
 Pt A&O x4. On RA. UP Ad lib. Family at bedside. VSS. Tele and Cont pulse ox ongoing. PRN IV dilaudid  given for pain, zofran  given for nausea, and benadryl  itching/rash during the shift. Safety precautions maintained throughout the shift.  Shift Summary  Pain management required multiple PRN administrations of HYDROmorphone , with pain scores remaining elevated and continuous throughout the shift.   Fall prevention protocols were consistently implemented, including scheduled toileting, low bed positioning, and hourly visual checks, with no reported falls.   Skin integrity was maintained with regular assessments, CHG wipes, and Braden Scale scores remaining in a safe range.   Infection prevention measures were actively performed, including environmental surveillance and cohorting.   Overall, comfort and safety interventions were maintained, but pain control remained challenging.     Absence of Hospital-Acquired Illness or Injury: No new hospital-acquired illnesses or injuries documented; infection prevention measures and environmental surveillance were consistently performed throughout the shift.     Optimal Comfort and Wellbeing: Comfort interventions included frequent pain assessments and PRN medication administration, but abdominal pain persisted and increased in severity at times, with some improvement after interventions.     Optimal Pain Control and Function: Pain remained primarily abdominal, with fluctuating intensity and descriptors; HYDROmorphone  was administered multiple times, but pain scores remained elevated and continuous.     Absence of Fall and Fall-Related Injury: Fall prevention strategies were maintained, including hourly visual checks, low bed positioning, and scheduled toileting; no falls or injuries were reported.     Skin Health and Integrity: Braden Scale score decreased slightly but remained in a safe range; skin assessments were within defined limits, and CHG wipes were used for hygiene.

## 2024-02-29 LAB — VITAMIN D 25 HYDROXY: VITAMIN D, TOTAL (25OH): 11.1 ng/mL — ABNORMAL LOW (ref 20.0–80.0)

## 2024-03-02 DIAGNOSIS — D48119 Desmoid tumor: Principal | ICD-10-CM

## 2024-03-02 NOTE — Telephone Encounter (Signed)
 Copied from CRM #[id number]. Topic: Return Scheduling - Cancel/Reschedule  >> Mar 02, 2024  2:49 PM Almeda NOVAK wrote:      Hi ,    Mother of patient [name] [name] contacted the Communication Center to reschedule their appointment for 12/30.  Requesting Friday or this weekend. She needs the fluids but she is having a procedure at [facility] on 12/30    Please call Katharine at [phone]      Thank you,  Almeda GORMAN Avena  [facility] Communication Center   [phone]

## 2024-03-04 DIAGNOSIS — D48119 Desmoid tumor: Principal | ICD-10-CM

## 2024-03-04 NOTE — Telephone Encounter (Signed)
 Copied from CRM #[id number]. Topic: Return Scheduling - Return Request  >> Mar 04, 2024  2:35 PM Harlene NOVAK wrote:      Hello,    We received a call regarding patient [name] [name]. [name] is needing to reschedule the infusion appt to asap.     Thank you,   Harlene Stager  [facility] Communication Center  [phone]

## 2024-03-05 DIAGNOSIS — D48119 Desmoid tumor: Principal | ICD-10-CM

## 2024-03-07 DIAGNOSIS — D48119 Desmoid tumor: Principal | ICD-10-CM

## 2024-03-07 MED ORDER — OXYCODONE 20 MG TABLET
ORAL_TABLET | ORAL | 0 refills | 2.00000 days | Status: CP | PRN
Start: 2024-03-07 — End: ?

## 2024-03-07 NOTE — Telephone Encounter (Signed)
 Received voicemail from patient's mother stating that she was informed during hospitalization that Chanon would need a referral to a GI surgeon for polyp removal. Upon chart review, I was unable to locate documentation of the referral discussed.  Patient has previously followed with Dr. Lucian. I will route this message to him to confirm if he is aware or if a referral is needed.

## 2024-03-07 NOTE — Telephone Encounter (Signed)
 Copied from CRM [id number]. Topic: Return Scheduling - Return Request  >> Mar 07, 2024  3:30 PM Jon RAMAN wrote:      Hello,    We received a call regarding patient [name] [name]. Caller is requesting the following return appointments: return appointment.    We are unable to schedule this appointment because pt request. Please reach out to the patient to provide further assistance.    Patient reporting worsening symptoms as reason for call? Oncology Return Appointment Symptoms Y N: NO - Send to return scheduler     Thank you,   Jon JONELLE Shoulder  [facility] Communication Center  [phone]

## 2024-03-08 DIAGNOSIS — D48119 Desmoid tumor: Principal | ICD-10-CM

## 2024-03-08 MED ORDER — NALOXONE 4 MG/ACTUATION NASAL SPRAY
0 refills | 0.00000 days
Start: 2024-03-08 — End: ?

## 2024-03-08 NOTE — Progress Notes (Unsigned)
 [facility]  [address]  [place], [place] [address]      Pharmacist Chronic Pain Medication Management Visit Summary    Assessment/Plan:   [name] [name] is a 23 y.o. adult with past medical history significant for DVT (RUE, s/p Eliquis  x3 months), [name] syndrome s/p colectomy, desmoid tumor, anemia, and severe protein-calorie malnutrition who is being followed at [facility] for abdominal and right shoulder pain that is consistent with the diagnosis of cancer associated pain.     No diagnosis found.  Today ***    Update UDS and pain agreement  Needs narcan     - Prescribe oxycodone  20 mg, 120 tablets, to be taken up to four times a day x1 month  - Plan to transition to oxycodone  10 mg tablets, 180 tablets, with a goal to reduce to 10 mg three times a day for next fill  - Encourage gradual reduction in dosage and frequency to manage dependence and pain effectively.  - Use Voltaren  gel topically over the area of pain up to four times a day.   - Continue current bowel management strategies, including the use of Imodium  as needed.     The patient does  appear to be utilizing pain medications appropriately and does  report that the medications do improve patient's quality of life and functionality level.    Medication Monitoring:      PDMP reviewed 12/17/2023 Appropriate   Pill count 12/17/2023 Virtual visit ***      Urine toxicology 12/23/22 Appropriate +oxy, +benzo, +buprenorphine , due next visit ***   Treatment agreement 02/18/23 Goals: continue with nursing school, due next visit ***   Naloxone  rx Date 12/23/22, due at next visit, has had insurance denials and may need OTC however ***      COMM 08/05/23 7 - higher due to recent hospitalization will update at next visit, will obtain at next in-person visit, due at next visit ***   Benzodiazepine Yes Ok with minimal use per psychiatry, pt will avoid taking close to opioid doses and only while awake, not near bedtime   Pain Psychology Yes Now seeing Becca   QTC 10/13/23 NSR, Qtc 450   Concerns yes OIH, planning to wean off opioids, planning to minimize benzo use   Opioid Risk MODERATE- see comment Given dose and higher COMM, but working to wean and patient is amenable      Opioid Changes and Yearly MME  Date MME Comments   12/23/22 45 + 1.5 mg BID of Suboxone  Post-discharge regimen, increased suboxone  to decrease oxy   01/21/23 45 + 1 mg TID Suboxone  + additional 10 mg oxy per day x 10 days Split Suboxone  to TID dosing for pain, gave additional #10 oxy 10 mg for additional 10 mg oxy per day for 10 days after wisdom tooth extractions   02/18/23 75 + 20 mcg/h Butrans  Increased to oxy 10 mg x4-5 per day for 2-3 weeks given patient's current pain flare in setting of dramatically increased BM frequency, switched to Butrans  given pt's anxiety surrounding Suboxone  and desire for more consistent analgesia with Butrans  that will also minimize pill burden; plan to wean off of oxy hereafter, Butrans  is a technical net decrease from Suboxone    05/07/23 180 + 20 mcg/h Butrans  Discharged on 20 mg liquid oxycodone  q4h PRN + Butrans  20 mcg/h given severe pain that has persisted as an outpatient and thus requirement remains, she is willing to work towards weaning  08/05/23 90-150 + 20 mch/hr Butrans  Higher than historical average given recent pain flare and admission but lower than last visit, working to wean   09/22/23 90 + 20 mcg/h Planning to wean down to 45 + butrans  45 by next visit   10/23/23 180 + 20 mch/h Butrans  Increased to maximum outpatient dose given continued severe pain post-discharge, trying to avoid rehospitalization, tolerates this dose as outpatient and inpatient with no issues, planning for close follow up and wean to 20 mg q4h PRN but max 5 doses per day before RTC   12/16/23 120 + 20 mcg/h Butrans  Weaned from high of 180 MME, pausing at 20 mg QID for 1 month then plan to wean down by 10 mg oxy per day each several days to goal of 10 mg TID        Return to clinic to see {Blank single:19197::clinical pharmacist,physician,clinical pharmacist or physician,clinical pharmacist, nurse practitioner or physician,***} in *** months.    Today I have prescribed:  Requested Prescriptions      No prescriptions requested or ordered in this encounter       No orders of the defined types were placed in this encounter.      I spent a total of {NUMBERS; 0-45 BY 5:10291} minutes {VISIT UBEZ:24588} with the patient delivering clinical care and providing education/counseling.    Medications reviewed in EPIC medication station and updated today by the clinical pharmacist practitioner.    I have personally consulted with Dr. Delores regarding [name] [name] ???s medication regimen prior to prescribing controlled substances today and they are in agreement with the plan.    I have reviewed the [facility] Medical Board statement on use of controlled substances for the treatment of pain as well as the CDC Guideline for Prescribing Opioids for Chronic Pain.  I have reviewed the [place] Controlled Substance Monitoring Database.    [name] [name], PharmD, CPP  ______________________________________________________________________    History of Present Illness:     Reason for Visit:  Medication management for Chronic Pain.    Attending Pain Physician/Last Visit Date:  Dr. Delores  on 12/16/23  Last Pain Visit Date: 12/16/23  Last Pain Visit Provider: Dr. Delores [name] [name] is a 23 y.o. adult who is being followed at the [facility] with past medical history significant for DVT (RUE, s/p Eliquis  x3 months), [name] syndrome s/p colectomy, desmoid tumor, anemia, and severe protein-calorie malnutrition who is being followed at [facility] for abdominal and right shoulder pain that is consistent with the diagnosis of cancer associated pain.      At last visit, the patient continued to experience severe right-sided abdominal pain, potentially related to desmoid tumors. The patient was using oxycodone  20 mg four times a day, with a plan to taper down. We planned to transition the patient to oxycodone  10 mg tablets #180  tablets to facilitate tapering, with a goal of 10 mg three times a day.    Since last visit the patient reports pain is {AMB PHA Better/Worse:21025273}.    In regards to medications currently taken for pain management the patient {is/is not:9024::is} tolerating these medications well and {Denies/complains:31533} associated side effects {NKTsideeffects:88426}. Patient {denies/admits to:25788} misuse, abuse or diversion of medications. Patient reports being {stable/unstable:60080} on this medication regimen and thinks that the medications {Actions; do/do not:19616} improve patient's quality of life and {Actions; do/do not:19616} improve patient's functionality level. Patient reports that the patient {is/is not:23060} able to perform majority  of ADLs on the current regimen.     Patient denies homicidal/suicidal ideation.    Current Pain Medication Regimen:  -Butrans  patch 20 mcg/h  -Oxycodone  20 mg TID  -Ativan  PRN, now approximately 2 times per week  -Tylenol  1000 mg 3 times daily PRN  -Voltaren  1% gel  -Lidocaine  ointment  -Lidocaine  patch  -Baclofen  5 mg 3 times daily        Previous Medication Trials:  NSAIDS: Toradol  (c/f GIB after)  Antidepressant/anxiety: Cymbalta  (stopped due to mood side effects), amitriptyline  (insomnia and increased J-pouch output)  Anticonvulsants: Gabapentin , Lyrica  (both cause lower extremity edema)  Muscle relaxants: Robaxin , baclofen  (most effective of the conventional therapies), p.o. Valium  (attempting to limit due to opioid dependence)  Topicals: lidocaine , voltaren  gel  Short-acting opiates: oxycodone , PO HM  Long-acting opiates: Buprenorphine   Other: Tylenol      Previous Interventions  Procedures:   - TPIs in August 2023 with initial pain flare, subsequent improvement, and near immediate relief flare of pain; hyperalgesic with procedure  - TPIs of Right trapezius, rhomboid, infraspinatus on 12/04/2021.  - TPIs on 10/25  - Celiac plexus block on 11/1 (severe post-procedural pain flare requiring admission for pain control, significant hyperalgesia)  - TPIs 11/22  - Last TPI>1 YR AGO     Infusions:  Lidocaine  infusions helped during inpatient exacerbations of pain more than ketamine  infusions    Subjective:     Adverse Effects of Pain Medications:   Constipation:  {GEN YES WN:78740}  Managed with ***.  Sedation:  {GEN YES NO:21259}.    Reported Pain Scores:  Worst:  {Pain Score:775-654-5655}  Least:  {Pain Score:775-654-5655}  Right Now:  {Pain Score:775-654-5655}  Average over the past month:  {Pain Score:775-654-5655}    Reported Description of Pain:  Location:  ***  Character:  {AMB PHA Pain Character:21025270}  Frequency:  {NKTpainfrequency:88427}  Pain is worst in:  {AMB PHA Pain time:21025271}  Pain negatively affects:  {AMB PHA Pain Neg Affects:21025272}    Reported Effectiveness of Pain Medications since last visit:    Patient documents that their pain {Blank single:19197::is worse,is better,stayed the same,***} since their last visit.  They documented that they are {Blank single:19197::stable,unstable,***} on their current regimen and that the medications {Blank single:19197::do,do not,***} help to improve the quality of their life.    Objective:     PAST MEDICAL HISTORY:    Active Ambulatory Problems     Diagnosis Date Noted    [name] (familial adenomatous polyposis) 08/25/2012    Intestinal polyps 08/25/2012    [name] 08/25/2012    Benign neoplasm of skin of trunk 01/17/2004    Other benign neoplasm of connective and other soft tissue of upper limb, including shoulder 02/26/2011    History of colonic polyps 01/09/2012    [name] of skin 08/25/2012    Thyroid cyst 10/05/2018    Chemotherapy-induced nausea 05/14/2019    Cancer associated pain 08/04/2019    Iron  deficiency anemia due to chronic blood loss 11/10/2019    Dehydration 10/14/2020    Physical deconditioning 03/15/2021    History of colectomy 03/15/2021    Acute deep vein thrombosis (DVT) of axillary vein of right upper extremity (CMS-HCC) 03/20/2021    Severe protein-calorie malnutrition (HHS-HCC) 03/20/2021    Winged scapula of right side 07/16/2021    Lower abdominal pain 08/27/2021    Intractable abdominal pain 12/05/2021    Generalized anxiety disorder with panic attacks 12/09/2021    SBO (small bowel obstruction) (CMS-HCC) 02/16/2021  Moderate episode of recurrent major depressive disorder (CMS-HCC) 03/13/2022    Small intestinal bacterial overgrowth (SIBO) 03/14/2022    Hypersensitivity reaction 04/08/2022    Anemia 06/13/2022    On total parenteral nutrition (TPN) 06/13/2022    Urinary retention 08/07/2022    High risk medication use 09/17/2022    Other acute postprocedural pain 09/25/2022    Pain medication agreement signed 12/23/2022    Insomnia 02/13/2023    Iron  deficiency 02/25/2023    Dizziness 03/09/2023    Fatigue 03/09/2023    Pouchitis    (CMS-HCC) 03/20/2023    Elevated liver transaminase level 04/01/2023    C. difficile diarrhea 05/02/2023    Photophobia 07/05/2023    Chronic migraine with aura without status migrainosus, not intractable 07/05/2023    Hypercoagulable state (HHS-HCC) 07/08/2023    Anxiety and depression 07/08/2023    GI bleed 07/04/2023    Gastrostomy status    (CMS-HCC) 10/27/2023     Resolved Ambulatory Problems     Diagnosis Date Noted    Syncope 05/14/2019    Hyperglycemia 02/28/2021    Hypoglycemia 02/28/2021    GI bleeding 02/12/2022    Positive blood culture 04/01/2023    Nasogastric tube present 05/07/2023    Itching 07/31/2023     Past Medical History:   Diagnosis Date    Abdominal pain     Acid reflux     Anesthesia complication     Cancer    (CMS-HCC)     Cataract of right eye     COVID-19 virus infection 01/2019    Cyst of thyroid determined by ultrasound     Difficult intravenous access     [name] (HHS-HCC)     Gastric polyps     History of chemotherapy     History of colon polyps     History of COVID-19 01/2019    Ileus    (CMS-HCC) 03/16/2022    PONV (postoperative nausea and vomiting)     Rectal bleeding     Syncopal episodes        Encounter Medications[1]      Allergies  Allergies[2]    Physical Examination:  Vitals: There were no vitals filed for this visit.  General:  There is no evidence of sedation.  There are no overt pain behaviors observed.    Musculoskeletal:  {AMB PHA Musculoskeletal:21025276}                            [1]   Outpatient Encounter Medications as of 03/09/2024   Medication Sig Dispense Refill    acetaminophen  (TYLENOL  EXTRA STRENGTH) 500 MG tablet Take 2 tablets (1,000 mg total) by mouth every eight (8) hours.      ammonium lactate  (LAC-HYDRIN ) 12 % lotion Apply 1 Application topically every hour as needed.      baclofen  (LIORESAL ) 10 MG tablet Take 0.5 tablets (5 mg total) by mouth Three (3) times a day. 45 tablet 2    bismuth  subsalicylate (PEPTO BISMOL) 262 mg/15 mL suspension Take 30 mL by mouth four (4) times a day as needed. 474 mL 0    buprenorphine  20 mcg/hour PTWK transdermal patch Place 1 patch on the skin every seven (7) days. 4 patch 2    buprenorphine  20 mcg/hour PTWK transdermal patch Place 1 patch on the skin every seven (7) days. 1 patch 0    butalbital -acetaminophen -caffeine  (ESGIC ) 50-325-40 mg per tablet Take 1 tablet by mouth two (  2) times a day as needed for headache. 20 tablet 0    cetirizine  (ZYRTEC ) 10 MG tablet Take 1 tablet (10 mg total) by mouth two (2) times a day. After iron  infusions      diphenoxylate -atropine  (LOMOTIL ) 2.5-0.025 mg per tablet Take 1 tablet by mouth four (4) times a day for 14 days. 56 tablet 0    escitalopram  oxalate (LEXAPRO ) 5 MG tablet Take 1 tablet (5 mg total) by mouth daily. 30 tablet 2    famotidine  (PEPCID ) 20 MG tablet Take 1 tablet (20 mg total) by mouth nightly. 30 tablet 3 fluticasone  propionate (FLONASE ) 50 mcg/actuation nasal spray Use 1 spray under the butrans  patch once every 7 days to prevent allergic reaction to the adhesive      hydrocortisone  (ANUSOL -HC) 2.5 % rectal cream AS DIRECTED      hydrocortisone  (CORTEF ) 10 MG tablet Take 1 tablet (10 mg total) by mouth every morning. 90 tablet 1    lidocaine  (XYLOCAINE ) 2 % Soln Mix with Children's Benadryl  and Maalox. Swish and spit every 4 hours as needed for mouth pain 100 mL 1    lidocaine  (XYLOCAINE ) 5 % ointment Apply to affected area daily 37 g 3    loperamide  (IMODIUM ) 2 mg capsule Take 1 capsule (2 mg total) by mouth four (4) times a day as needed. 100 capsule 3    LORazepam  (ATIVAN ) 0.5 MG tablet Take 1 tablet (0.5 mg total) by mouth two (2) times a day as needed for anxiety. 10 tablet 0    naloxegol  (MOVANTIK ) 12.5 mg tablet Take 1 tablet (12.5 mg total) by mouth daily as needed (constipation).      naloxone  (NARCAN ) 4 mg nasal spray One spray in either nostril once for known/suspected opioid overdose. May repeat every 2-3 minutes in alternating nostril til EMS arrives 1 each 0    [Paused] nirogacestat  (OGSIVEO ) 100 mg tablet Take 1 tablet (100 mg total) by mouth two (2) times a day. Swallow tablets whole; do not break, crush, or chew. 60 tablet 5    ondansetron  (ZOFRAN -ODT) 8 MG disintegrating tablet Dissolve 1 tablet (8 mg total) in the mouth every eight (8) hours as needed for nausea. 30 tablet 0    oxyCODONE  (ROXICODONE ) 10 mg immediate release tablet Take 2 tablets (20 mg total) by mouth every eight (8) hours as needed for pain. DNF: 01/15/24 180 tablet 0    oxyCODONE  (ROXICODONE ) 20 mg immediate release tablet Take 1 tablet (20 mg total) by mouth every four (4) hours as needed for pain. DNF: 03/07/24 12 tablet 0    [EXPIRED] oxyCODONE  (ROXICODONE ) 20 mg immediate release tablet Take 1 tablet (20 mg total) by mouth every 4 (four) hours for 3 days. 18 tablet 0    [EXPIRED] oxyCODONE  (ROXICODONE ) 20 mg immediate release tablet Take 1 tablet (20 mg total) by mouth every four (4) hours for 3 days. 18 tablet 0    pantoprazole  (PROTONIX ) 40 MG tablet Take 1 tablet (40 mg total) by mouth Two (2) times a day (30 minutes before a meal). 60 tablet 0    predniSONE  (DELTASONE ) 50 MG tablet Take 1 tablet (50 mg total) by mouth two (2) times a day. Take 13 and 7 hours prior to iron  infusion. 2 tablet 0    promethazine  (PHENERGAN ) 12.5 MG tablet Take 1 tablet (12.5 mg total) by mouth every six (6) hours as needed. 20 tablet 0    scopolamine  (TRANSDERM-SCOP) 1 mg over 3 days Place 1 patch (  1 mg total) on the skin every third day. 10 patch 0    sucralfate  (CARAFATE ) 100 mg/mL suspension Take 10 mL (1 g total) by mouth four (4) times a day.      zinc  oxide-cod liver oil (DESITIN 40%) 40 % Pste Apply topically daily as needed (buring/irritation). 454 g 0     No facility-administered encounter medications on file as of 03/09/2024.   [2]   Allergies  Allergen Reactions    Adhesive Tape-Silicones Hives and Rash     Paper tape and Tegederm ok. Biopatch causes blistering but has tolerated CHG Tegaderm    Ferrlecit  [Sodium Ferric Gluconat-Sucrose] Swelling and Rash    Levofloxacin  Swelling and Rash     Swelling in mouth, rash,     Methylnaltrexone       Per Patient: I lost bowel control, severe abdominal cramping, and elevated BP    Neomycin Swelling     Rxn after ear drops; ear swelling    Papaya Hives    Morphine Nausea And Vomiting    Zosyn [Piperacillin-Tazobactam] Itching and Rash     Red and itchy    Adhesive      Other Reaction(s): Not available    Compazine [Prochlorperazine] Other (See Comments)     Extreme agitation    Iron  Analogues     Metoclopramide  Diarrhea     Other Reaction(s): Not available    Metronidazole  Other (See Comments)    Reglan  [Metoclopramide  Hcl] Diarrhea    Iron  Dextran Itching     Received iron  dextran 06/08/22 over 12 hours, had itching and redness/flushing during the infusion and for a couple days after. Required IV benadryl  w/flares between doses and ultimately treated w/IV methylpred for 2 days.     Latex, Natural Rubber Rash

## 2024-03-09 ENCOUNTER — Ambulatory Visit
Admit: 2024-03-09 | Discharge: 2024-03-10 | Payer: BLUE CROSS/BLUE SHIELD | Attending: Ambulatory Care | Primary: Ambulatory Care

## 2024-03-09 DIAGNOSIS — D48119 Desmoid tumor: Principal | ICD-10-CM

## 2024-03-09 DIAGNOSIS — G894 Chronic pain syndrome: Principal | ICD-10-CM

## 2024-03-09 DIAGNOSIS — Z0289 Encounter for other administrative examinations: Principal | ICD-10-CM

## 2024-03-09 DIAGNOSIS — R1084 Generalized abdominal pain: Principal | ICD-10-CM

## 2024-03-09 DIAGNOSIS — G893 Neoplasm related pain (acute) (chronic): Principal | ICD-10-CM

## 2024-03-09 DIAGNOSIS — F1190 Opioid use, unspecified, uncomplicated: Principal | ICD-10-CM

## 2024-03-09 MED ORDER — OXYCODONE 20 MG TABLET
ORAL_TABLET | ORAL | 0 refills | 0.00000 days | Status: CP
Start: 2024-03-09 — End: ?
  Filled 2024-03-09: qty 135, 30d supply, fill #0

## 2024-03-09 MED ORDER — BUPRENORPHINE 20 MCG/HOUR WEEKLY TRANSDERMAL PATCH
MEDICATED_PATCH | TRANSDERMAL | 2 refills | 28.00000 days | Status: CP
Start: 2024-03-09 — End: ?

## 2024-03-10 DIAGNOSIS — D48119 Desmoid tumor: Principal | ICD-10-CM

## 2024-03-11 ENCOUNTER — Inpatient Hospital Stay: Admit: 2024-03-11 | Discharge: 2024-03-11 | Payer: BLUE CROSS/BLUE SHIELD

## 2024-03-11 ENCOUNTER — Ambulatory Visit: Admit: 2024-03-11 | Discharge: 2024-03-11 | Payer: BLUE CROSS/BLUE SHIELD

## 2024-03-11 DIAGNOSIS — K56609 Unspecified intestinal obstruction, unspecified as to partial versus complete obstruction: Principal | ICD-10-CM

## 2024-03-11 DIAGNOSIS — K91850 Pouchitis: Principal | ICD-10-CM

## 2024-03-11 DIAGNOSIS — D1391 FAP (familial adenomatous polyposis): Principal | ICD-10-CM

## 2024-03-11 DIAGNOSIS — D6859 Other primary thrombophilia: Principal | ICD-10-CM

## 2024-03-11 DIAGNOSIS — E43 Unspecified severe protein-calorie malnutrition: Principal | ICD-10-CM

## 2024-03-11 DIAGNOSIS — Z79899 Other long term (current) drug therapy: Principal | ICD-10-CM

## 2024-03-11 DIAGNOSIS — I82A11 Acute embolism and thrombosis of right axillary vein: Principal | ICD-10-CM

## 2024-03-11 DIAGNOSIS — D48119 Desmoid tumor: Principal | ICD-10-CM

## 2024-03-11 DIAGNOSIS — D500 Iron deficiency anemia secondary to blood loss (chronic): Principal | ICD-10-CM

## 2024-03-11 DIAGNOSIS — Z931 Gastrostomy status: Principal | ICD-10-CM

## 2024-03-11 DIAGNOSIS — Z9049 Acquired absence of other specified parts of digestive tract: Principal | ICD-10-CM

## 2024-03-11 DIAGNOSIS — G893 Neoplasm related pain (acute) (chronic): Principal | ICD-10-CM

## 2024-03-11 DIAGNOSIS — Q8789 Other specified congenital malformation syndromes, not elsewhere classified: Principal | ICD-10-CM

## 2024-03-11 LAB — CBC W/ AUTO DIFF
BASOPHILS ABSOLUTE COUNT: 0 10*9/L (ref 0.0–0.1)
BASOPHILS RELATIVE PERCENT: 0.3 %
EOSINOPHILS ABSOLUTE COUNT: 0.1 10*9/L (ref 0.0–0.5)
EOSINOPHILS RELATIVE PERCENT: 1.7 %
HEMATOCRIT: 34.7 % (ref 34.0–44.0)
HEMOGLOBIN: 11.7 g/dL (ref 11.3–14.9)
LYMPHOCYTES ABSOLUTE COUNT: 1.8 10*9/L (ref 1.1–3.6)
LYMPHOCYTES RELATIVE PERCENT: 24.4 %
MEAN CORPUSCULAR HEMOGLOBIN CONC: 33.6 g/dL (ref 32.0–36.0)
MEAN CORPUSCULAR HEMOGLOBIN: 29.8 pg (ref 25.9–32.4)
MEAN CORPUSCULAR VOLUME: 88.5 fL (ref 77.6–95.7)
MEAN PLATELET VOLUME: 9.3 fL (ref 6.8–10.7)
MONOCYTES ABSOLUTE COUNT: 0.5 10*9/L (ref 0.3–0.8)
MONOCYTES RELATIVE PERCENT: 6.5 %
NEUTROPHILS ABSOLUTE COUNT: 4.9 10*9/L (ref 1.8–7.8)
NEUTROPHILS RELATIVE PERCENT: 67.1 %
PLATELET COUNT: 259 10*9/L (ref 150–450)
RED BLOOD CELL COUNT: 3.92 10*12/L — ABNORMAL LOW (ref 3.95–5.13)
RED CELL DISTRIBUTION WIDTH: 15.6 % — ABNORMAL HIGH (ref 12.2–15.2)
WBC ADJUSTED: 7.4 10*9/L (ref 3.6–11.2)

## 2024-03-11 LAB — COMPREHENSIVE METABOLIC PANEL
ALBUMIN: 3.6 g/dL (ref 3.4–5.0)
ALKALINE PHOSPHATASE: 65 U/L (ref 46–116)
ALT (SGPT): 20 U/L (ref 10–49)
ANION GAP: 10 mmol/L (ref 5–14)
AST (SGOT): 14 U/L (ref <=34)
BILIRUBIN TOTAL: 0.2 mg/dL — ABNORMAL LOW (ref 0.3–1.2)
BLOOD UREA NITROGEN: 7 mg/dL — ABNORMAL LOW (ref 9–23)
BUN / CREAT RATIO: 13
CALCIUM: 9.5 mg/dL (ref 8.7–10.4)
CHLORIDE: 105 mmol/L (ref 98–107)
CO2: 27 mmol/L (ref 20.0–31.0)
CREATININE: 0.52 mg/dL — ABNORMAL LOW (ref 0.55–1.02)
EGFR CKD-EPI (2021) FEMALE: >90 mL/min/1.73m2 (ref >=60)
GLUCOSE RANDOM: 107 mg/dL (ref 70–179)
POTASSIUM: 3.8 mmol/L (ref 3.4–4.8)
PROTEIN TOTAL: 6.4 g/dL (ref 5.7–8.2)
SODIUM: 142 mmol/L (ref 135–145)

## 2024-03-11 MED ORDER — ONDANSETRON 8 MG DISINTEGRATING TABLET
ORAL_TABLET | Freq: Three times a day (TID) | 3 refills | 10.00000 days | Status: CP | PRN
Start: 2024-03-11 — End: 2024-03-11
  Filled 2024-03-11: qty 30, 10d supply, fill #0

## 2024-03-11 MED ORDER — CHOLECALCIFEROL (VITAMIN D3) 25 MCG (1,000 UNIT) TABLET
ORAL_TABLET | Freq: Every day | ORAL | 6 refills | 30.00000 days | Status: CP
Start: 2024-03-11 — End: ?

## 2024-03-11 MED ORDER — CHOLECALCIFEROL (VITAMIN D3) 10 MCG (400 UNIT) TABLET
ORAL_TABLET | Freq: Every day | ORAL | 3 refills | 30.00000 days | Status: CP
Start: 2024-03-11 — End: ?

## 2024-03-11 MED ADMIN — heparin, porcine (PF) 100 unit/mL injection 500 Units: 500 [IU] | INTRAVENOUS | @ 19:00:00 | Stop: 2024-03-12

## 2024-03-11 MED ADMIN — sodium chloride (NS) 0.9 % infusion: 1000 mL/h | INTRAVENOUS | @ 17:00:00 | Stop: 2024-03-11

## 2024-03-11 NOTE — Progress Notes (Signed)
 Pt arrived to chair 46 in outpatient oncology infusion for IV hydration treatment. Pt accessed via port, with positive blood return. Treatment completed, no concerns. Port was de-accessed. Pt discharged via a walker. AVS declined.

## 2024-03-11 NOTE — Progress Notes (Signed)
 [facility] BONE AND SOFT TISSUE ONCOLOGY RETURN VISIT    Encounter Date: 03/11/2024  Patient Name: [name] [name]  Medical Record Number: [id number]    Referring Physician: Loris Lang Masters, MD  [address]  [facility]  [place],  [place] [address]    Primary Care Provider: Job Lukes, [facility]    DIAGNOSIS:  1. [name]    2. High risk medication use    3. Acute deep vein thrombosis (DVT) of axillary vein of right upper extremity (CMS-HCC)    4. [name] (familial adenomatous polyposis)    5. Pouchitis    (CMS-HCC)    6. SBO (small bowel obstruction) (CMS-HCC)    7. Cancer associated pain    8. Gastrostomy status    (CMS-HCC)    9. History of colectomy    10. Iron  deficiency anemia due to chronic blood loss        ASSESSMENT/PLAN: 24 y.o. 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    Desmoid fibromatosis, history of [name]     She has had numerous sites of disease, including paraspinal, chest wall, right arm/wrist. She has had several cryoablations with Dr. Babara which have seemed to offer clinical benefit. She trialed sorafenib  which seemed to lead to shrinkage of lesions, but she had significant intolerance with dizziness, presyncope, fatigue, hair loss, rash, GI side effects.      We have discussed at length the nature of desmoid fibromatosis tumors, the fact they can wax and wane independent of therapy, are not cancers in a metastatic potential, but can be locally infiltrative and cause substantial morbidity to the site of disease and occasionally mortality. I reviewed that the emerging standard of care is observation based on the fact that 20-30% will experience spontaneous regression in the year following diagnosis. Given her long experience with desmoids, complete regression may be less likely, but we will still approach this cautiously with parallel goals of avoidance of overtreatment (particularly surgical) and palliation of symptoms / preservation of function.     At first visit in 2022, her lesions were in the shoulder, right forearm, chest area, with a possible desmoid in her left thigh. Since then, she has had waxing waning course with significant pain issues, malnutrition requiring intermittent TPN and tube feeds, multiple GI bleeds, significant anxiety, depression, multiple hospitalizations. Had tumor response to sorafenib  but developed GI bleeding and had to stop. IV iron  infusion led to severe anaphylaxis.    Nirogacestat  was started in July of 2024 but she has continued to have significant tumor associated pain, severe diarrhea, chronic GI bleeding and we have been unable to meaningfully alter her disease trajectory. Thus, today (03/11/24), we are initiating Doxil to try to improve disease control, pain, recurrent GI issues.  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 Multiple hospitalizations, persistent symptoms, and significant decline in quality of life have occurred. Prior therapies, including nirogacestat , were not tolerated. Ongoing bleeding, pain, and functional impairment necessitate a more aggressive approach. Doxil (liposomal doxorubicin) was selected for rapid disease control and symptom improvement. Risks of Doxil, including infusion reactions, cytopenias, mucositis, hand-foot syndrome, diarrhea, nausea, vomiting, skin changes, alopecia, and rare cardiotoxicity, were discussed. Coordination with GI for possible endoscopic intervention at the anastomosis is ongoing. Disease complications have led to profound social isolation and functional decline.  -  Initiated Doxil (liposomal doxorubicin) chemotherapy every four weeks for 4-6 cycles, with interim imaging to assess response and option to extend or discontinue based on tolerability and efficacy.  - Ordered echocardiogram to assess cardiac function prior to Doxil initiation due to cardiotoxicity risk.  - Planned premedication with corticosteroids, diphenhydramine , cetirizine , prior to Doxil infusions to mitigate infusion reactions.  - Provided anticipatory guidance regarding Doxil side effects: infusion reactions, cytopenias, mucositis, hand-foot syndrome, diarrhea, nausea, vomiting, skin changes, and alopecia.  - Advised scheduling procedures one week prior to next chemotherapy infusion to minimize infection and bleeding risk, avoiding nadir.  - Follow up w GI for possible endoscopic intervention (spray or powder) at the anastomosis if accessible during future procedures.  - Planned MRI of chest and axilla to monitor for disease progression and evaluate new symptoms.  - Recommended supportive care for hand-foot syndrome (moisturization of hands and feet).    Axillary lymphadenopathy, under evaluation  New right axillary swelling and palpable lymphadenopathy with associated breast and axillary discomfort. Ultrasound revealed several abnormal-appearing lymph nodes (up to 1.2 cm). Benign etiology is suspected. MRI is scheduled for further evaluation and comparison to prior imaging.  - Scheduled MRI of axilla and chest in February for further evaluation and comparison to prior imaging.    Vitamin D  deficiency  Recent laboratory evaluation demonstrated vitamin D  deficiency.  - Prescribed vitamin D  supplementation.     Chronic multifactorial pain, including Cancer-related pain; visceral hyperalgesia  - Managed by Pain medicine      Syncopal episodes - continue standing IVF and hydrocortisone     Mental Health & Hot flashes  - continue Lexapro   - connect with Dr. Susi    RTC 2 weeks to start Doxil.    I personally reviewed the medical records, pathology and laboratory results and viewed the imaging. All questions were answered to the patient's apparent satisfaction and they voiced understanding and agreement with the plan. Pt has my card and contact information and is encouraged to reach out with any further questions.    Loy Little, MD/MPH  Assistant Professor  Bone and Soft Tissue Oncology Program  [facility]  Pager: [phone]  Nurse Navigator: Corean Bracket RN, BSN, OCN      REASON FOR CONSULTATION:   Patsye Sullivant is a 24 y.o. female who is seen in consultation at the request of Loris Lang Masters, MD for evaluation of her desmoid fibromatosis    HISTORY OF PRESENT ILLNESS:      First diagnosed at 24 years old - seen in [place] at first, had numerous cysts. First major issues were two paraspinal lesions, referred to [facility] for biopsy and ultimately resection.  Soon diagnosed with [name], seen by medical genetics early on.  Guided by Dr. Mliss Combs, referred out to proceduralists as needed.  2011 - had a growing lesion on her back as well as bilateral neck lesions, resected at [facility], found to be desmoid  02/2012 - surgery for thoracic spine desmoid. Maybe another flank lesion removed  2017 - wrist lesion treated by VIR with bupivicaine, kenalog   08/2016 - endoscopy with adenomatous polyp of the colon, non-dysplastic  02/2017 - subcutaneous lesion resection by plastic surgery (postauricular and RUE, RLE) - path revealed epidermal inclusion cysts, osteomas  05/2018: cryotherapy of left anterior chest wall lesion  05/2018: started sulindac  but struggled with adherence due to GI side effects  08/2018: cryo #2 to chest wall lesion  12/10/2018: started sorafenib  due to progression in left paraspinal  lesion, ?posterior chest wall lesion  02/2019: discontinued sorafenib  for several reasons: fatigue, dizziness, nausea, hair loss, as well as COVID infection in late November. Derm consulted for rash / hair loss, started on antihistamines, topical steroids. Trialed lower dose but ultimately stopped again 05/2019 due to toxicity.  06/2019: follow up with surgical oncology - plans for observation rather than repeat surgery  07/2019: cryoablation of anterior chest wall desmoid  07/2019: excision of subcutaneous LE mass and L neck masses, clinically appeared consistent with cysts  08/2019: cryoablation of R posterior chest wall, left paraspinal desmoid  From  09/16/22: Start nirogacestat .   09/25/22: Admitted with uncontrolled pain and muscle spasms after IR guided procedure. Prolonged hospital stay, neurologic evaluation, managed with muscle relaxers (baclofen , valium ). Discharge 9/11 to rehab. Ramped up on nirogacestat  with some side effects, discharged from rehab 10/2.  03/25/23 - 05/07/23: Prolonged, complex admission for pain, malnutrition. Found to have strep mitis blood culture. NG tube placed, TPN not recommended. Discharged to rehab, but then readmitted with C Diff, treated.  06/26/23: Scans with largely stable disease, although growing abdominal wall tumor.  4/26-5/11: Admitted with abdominal pain, hematochezia. She underwent iron  infusion and EGD, discharged on budesonide . Syncope felt most consistent with POTS.  07/31/23: Nirogacestat  up to 100mg  BID. Budesonide  for GI inflammation. Cards for POTS eval.   09/01/23: Recent abdominal pain led to RUQ US  - mild bil dil but no other findings. Bili not elevated. Cards does not think she meets criteria for POTS. Scans in 1 month.  09/29/23: Scans overall stable; possible enlargement of lesion posterior to right forth rib. Continues on nirogacestat  100 mg BID.  11/03/23: Admitted 10/13/2023-10/22/2023 with GI bleed; superficial ulceration at ileoanal anastomosis. Tolerated IV iron  with premed regimen. Nirogacestat  held due to association with GI bleeding. Suspected secondary adrenal insufficiency (+/- POTS / functional) contributing to syncopal episodes/orthostatic intolerance.  12/04/23: Bloody stool remains too voluminous to resume nirogacestat . IV iron  administered in clinic with Premeds.  12/29/23: Still having GIB. Hgb stable at 10.9. Scans in 1 mo including RUE and RLU scans. Follow up with GI.  11/19-12/19/25: Hospitalization for hematochezia. Hgb stable but hypernatremic. CT with bowel inflammation, no obstruction. EGD and pouchoscopy identified gastric polyps, rectal cuff congestion, but no pouchitis or bleeding source.   Some concern for dynamic, intermittent ischemia (narrowing of SMA) - but this was found to be patent on CT-A.  03/09/24: Underwent double balloon enteroscopy at [facility], no source of bleeding identified but proximal ileum could not be traversed due to desmoids. Bleeding thought to be anastamotic per Dr. Gaile, could pursue retrograde DBE.     Interval History:    History of Present Illness  Necha Cherokee Boccio is a 24 year old female with [name]-associated desmoid fibromatosis status post-colectomy who presents for post-hospitalization oncology follow-up and consideration of systemic therapy.    She was recently hospitalized for recurrent gastrointestinal bleeding, with no definitive source identified despite double balloon enteroscopy. Anastomotic bleeding remains a consideration. She continues to experience ongoing gastrointestinal bleeding, requiring multiple iron  infusions for iron  deficiency anemia. Recent iron  infusions were complicated by significant hypersensitivity reactions, including throat swelling, dysphonia, dyspnea, and cutaneous manifestations, which recurred with subsequent infusions.    She has a history of desmoid fibromatosis and reports persistent gastrointestinal symptoms, including frequent diarrhea, abdominal pain, and intermittent bowel obstruction. She attributes diarrhea to a possible stricture or incomplete pouch emptying, with frequent but low-volume bowel movements. Abdominal pain improved during hospitalization, allowing reduction in analgesics, but has worsened since discharge.  She reports difficulty eating, poor sleep, and recent syncopal episodes resulting in falls. She remains in contact with the pain management team and uses lorazepam  as needed for anxiety, with attention to timing relative to opioid use.    Imaging has demonstrated bowel compression and narrowing, with transient obstruction and vascular compression noted on CT angiogram. Nutcracker phenomenon has been previously identified. She is awaiting a second double balloon enteroscopy to further evaluate the pouch, as the prior procedure was unable to traverse the area of desmoid involvement.    She has developed a new palpable mass in the right chest/axilla, with associated swelling and soreness of the right axilla, more pronounced around menses. Axillary ultrasound revealed several abnormal lymph nodes (up to 1.2 cm), and MRI is scheduled for further evaluation. She has a history of prior cryoablation to a left pectoral mass and multiple prior MRIs for comparison.    She describes a significant decline in quality of life, with marked social isolation, inability to participate in usual activities, and spending the majority of her time at home. She expresses frustration with ongoing symptom management and lack of effective disease control, noting extensive hospitalizations and outpatient procedures over the past 18 months. She is seeking a more definitive treatment approach and is open to systemic chemotherapy given the impact of her disease and limitations of prior therapies.         REVIEW OF SYSTEMS:  A comprehensive review of 12 systems was negative except for pertinent positives noted in HPI.    Past Medical History, Surgical History, Family History were reviewed personally. Any changes were updated as above.    Social History:  Attended [employer] in [place], has been interrupted by health issues;  Wants to be a nurse in pediatric hematology/oncology; also pursuing child life degree  therapy dog, Layla  Mother [name] is a strong advocate, often present at visits    Pregnancy status: premenopausal, interested in future children    ALLERGIES/MEDICATIONS:  Reviewed in Epic    PHYSICAL EXAM:   BP 99/55  - Pulse 98  - Resp 17  - Ht 160 cm (5' 3)  - Wt 60.1 kg (132 lb 8 oz)  - SpO2 100%  - BMI 23.47 kg/m??   ECOG 1  Gen - well appearing, mother accompanying her   Eyes - conjunctivae clear, PERRL  ENT -no scleral icterus, moist mucous membranes  Lymph - no cervical or supraclavicular lymphadenopathy appreciated  Resp - clear to auscultation bilaterally, no wheezes, crackles or rales  CV - normal rate, regular rhythm, no lower extremity edema noted  Skin - flushing of skin noted on chest  Neuro - AOx3, EOM intact, no facial droop, speech fluent and coherent, moves all extremities without asymmetry  Psych - affect anxious    PATHOLOGY:   Pathology was personally reviewed as described in the HPI, detailed in Epic.     LABS:  Reviewed in Epic    RADIOLOGY:  Imaging was personally viewed and interpreted as summarized in the history (see above).

## 2024-03-11 NOTE — Patient Instructions (Signed)
 Office Visit on 03/11/2024   Component Date Value Ref Range Status    Sodium 03/11/2024 142  135 - 145 mmol/L Final    Potassium 03/11/2024 3.8  3.4 - 4.8 mmol/L Final    Chloride 03/11/2024 105  98 - 107 mmol/L Final    CO2 03/11/2024 27.0  20.0 - 31.0 mmol/L Final    Anion Gap 03/11/2024 10  5 - 14 mmol/L Final    BUN 03/11/2024 7 (L)  9 - 23 mg/dL Final    Creatinine 98/97/7973 0.52 (L)  0.55 - 1.02 mg/dL Final    BUN/Creatinine Ratio 03/11/2024 13   Final    eGFR CKD-EPI (2021) Female 03/11/2024 >90  >=60 mL/min/1.91m2 Final    eGFR calculated with CKD-EPI 2021 equation in accordance with [employer] and [employer] of [employer]ph[employer] Task Force recommendations.    Glucose 03/11/2024 107  70 - 179 mg/dL Final    Calcium 98/97/7973 9.5  8.7 - 10.4 mg/dL Final    Albumin 98/97/7973 3.6  3.4 - 5.0 g/dL Final    Total Protein 03/11/2024 6.4  5.7 - 8.2 g/dL Final    Total Bilirubin 03/11/2024 0.2 (L)  0.3 - 1.2 mg/dL Final    AST 98/97/7973 14  <=34 U/L Final    ALT 03/11/2024 20  10 - 49 U/L Final    Alkaline Phosphatase 03/11/2024 65  46 - 116 U/L Final    WBC 03/11/2024 7.4  3.6 - 11.2 10*9/L Final    RBC 03/11/2024 3.92 (L)  3.95 - 5.13 10*12/L Final    HGB 03/11/2024 11.7  11.3 - 14.9 g/dL Final    HCT 98/97/7973 34.7  34.0 - 44.0 % Final    MCV 03/11/2024 88.5  77.6 - 95.7 fL Final    MCH 03/11/2024 29.8  25.9 - 32.4 pg Final    MCHC 03/11/2024 33.6  32.0 - 36.0 g/dL Final    RDW 98/97/7973 15.6 (H)  12.2 - 15.2 % Final    MPV 03/11/2024 9.3  6.8 - 10.7 fL Final    Platelet 03/11/2024 259  150 - 450 10*9/L Final    Neutrophils % 03/11/2024 67.1  % Final    Lymphocytes % 03/11/2024 24.4  % Final    Monocytes % 03/11/2024 6.5  % Final    Eosinophils % 03/11/2024 1.7  % Final    Basophils % 03/11/2024 0.3  % Final    Absolute Neutrophils 03/11/2024 4.9  1.8 - 7.8 10*9/L Final    Absolute Lymphocytes 03/11/2024 1.8  1.1 - 3.6 10*9/L Final    Absolute Monocytes 03/11/2024 0.5  0.3 - 0.8 10*9/L Final    Absolute Eosinophils 03/11/2024 0.1  0.0 - 0.5 10*9/L Final    Absolute Basophils 03/11/2024 0.0  0.0 - 0.1 10*9/L Final

## 2024-03-14 DIAGNOSIS — D48119 Desmoid tumor: Principal | ICD-10-CM

## 2024-03-14 LAB — DRUG SCREEN, PAIN CLINIC, URINE
2-HYDROXY ETHYL FLURAZEPAM, UR: NOT DETECTED ng/mL
6-MONOACETYLMORPHINE, UR: NOT DETECTED ng/mL
7-AMINOCLONAZEPAM, UR: NOT DETECTED ng/mL
7-AMINOFLUNITRAZEPAM, UR: NOT DETECTED ng/mL
ALPHA-HYDROXY TRIAZOLAM, UR: NOT DETECTED ng/mL
ALPHA-HYDROXYALPRAZOLAM GLUCURONIDE, UR: NOT DETECTED ng/mL
ALPHA-HYDROXYALPRAZOLAM, UR: NOT DETECTED ng/mL
ALPRAZOLAM, UR: NOT DETECTED ng/mL
AMPHETAMINE, UR: NOT DETECTED ng/mL
BUPRENORPHINE, UR: NOT DETECTED ng/mL
CHLORDIAZEPOXIDE, UR: NOT DETECTED ng/mL
CLOBAZAM, UR: NOT DETECTED ng/mL
CLONAZEPAM, UR: NOT DETECTED ng/mL
COCAINE METABOLITES URINE: NEGATIVE ng/mL
CODEINE, UR: NOT DETECTED ng/mL
CODEINE-6-BETA-GLUCURONIDE, UR: NOT DETECTED ng/mL
COMMENT,URINE: NORMAL
CREATININE-O-ADULT: 360.4 mg/dL
DIAZEPAM, UR: NOT DETECTED ng/mL
DIHYDROCODEINE, UR: NOT DETECTED ng/mL
EDDP, UR: NOT DETECTED ng/mL
EPHEDRINE, UR: NOT DETECTED ng/mL
FLUNITRAZEPAM, UR: NOT DETECTED ng/mL
FLURAZEPAM, UR: NOT DETECTED ng/mL
HYDROCODONE, UR: NOT DETECTED ng/mL
LORAZEPAM, UR: NOT DETECTED ng/mL
MDA, UR: NOT DETECTED ng/mL
MDEA, UR: NOT DETECTED ng/mL
MDMA, UR: NOT DETECTED ng/mL
MEPERIDINE, UR: NOT DETECTED ng/mL
METHADONE, UR: NOT DETECTED ng/mL
METHAMPHETAMINE, UR: NOT DETECTED ng/mL
METHYLPHENIDATE, UR: NOT DETECTED ng/mL
MIDAZOLAM, UR: NOT DETECTED ng/mL
MORPHINE, UR: NOT DETECTED ng/mL
MORPHINE-6-BETA-GLUCURONIDE, UR: NOT DETECTED ng/mL
N-DESMETHYLCLOBAZAM, UR: NOT DETECTED ng/mL
NALOXONE, UR: NOT DETECTED ng/mL
NORDIAZEPAM, UR: NOT DETECTED ng/mL
NORFENTANYL, UR: NOT DETECTED ng/mL
NORHYDROCODONE, UR: NOT DETECTED ng/mL
NORMEPERIDINE, UR: NOT DETECTED ng/mL
O-DESMETHYLTRAMADOL, UR: NOT DETECTED ng/mL — AB
OXAZEPAM GLUCURONIDE, UR: NOT DETECTED ng/mL
OXAZEPAM, UR: NOT DETECTED ng/mL
OXIDANTS-O-ADULT: NEGATIVE
PH-O-ADULT: 5.8
PHENCYCLIDINE, UR: NOT DETECTED ng/mL
PHENTERMINE, UR: NOT DETECTED ng/mL
PRAZEPAM, UR: NOT DETECTED ng/mL
PROPOXYPHENE, UR: NOT DETECTED ng/mL
PSEUDOEPHEDRINE, UR: NOT DETECTED ng/mL
RITALINIC ACID, UR: NOT DETECTED ng/mL
SPECIFIC GRAVITY-O-ADULT: 1.028
TAPENTADOL, UR: NOT DETECTED ng/mL
TEMAZEPAM GLUCURONIDE, UR: NOT DETECTED ng/mL
TEMAZEPAM, UR: NOT DETECTED ng/mL
THC, SCREEN URINE: NEGATIVE ng/mL
TRAMADOL, UR: NOT DETECTED ng/mL
TRIAZOLAM, UR: NOT DETECTED ng/mL
URINE BARBITURATES MAYO: NEGATIVE ng/mL
ZOLPIDEM PHENYL-4-CARBOXYLIC ACID, UR: NOT DETECTED ng/mL
ZOLPIDEM, UR: NOT DETECTED ng/mL

## 2024-03-14 NOTE — Progress Notes (Signed)
 Specialty Medication(s): [place]     [name] has been dis-enrolled from the [facility] specialty pharmacy services as a result of medication discontinuation resulting from side effect intolerance.    Additional information provided to the patient: Switching to Doxil    Kambryn Dapolito A Alianny Toelle, [facility]  [facility] Specialty Pharmacist

## 2024-03-15 DIAGNOSIS — D48119 Desmoid tumor: Principal | ICD-10-CM

## 2024-03-17 ENCOUNTER — Inpatient Hospital Stay: Admit: 2024-03-17 | Discharge: 2024-03-17 | Payer: BLUE CROSS/BLUE SHIELD

## 2024-03-21 ENCOUNTER — Encounter: Admit: 2024-03-21 | Discharge: 2024-03-22 | Payer: BLUE CROSS/BLUE SHIELD | Attending: Clinical | Primary: Clinical

## 2024-03-21 ENCOUNTER — Encounter
Admit: 2024-03-21 | Discharge: 2024-03-22 | Payer: BLUE CROSS/BLUE SHIELD | Attending: Internal Medicine | Primary: Internal Medicine

## 2024-03-21 DIAGNOSIS — F4310 Post-traumatic stress disorder, unspecified: Principal | ICD-10-CM

## 2024-03-21 DIAGNOSIS — D48119 Desmoid tumor: Principal | ICD-10-CM

## 2024-03-21 DIAGNOSIS — F331 Major depressive disorder, recurrent, moderate: Principal | ICD-10-CM

## 2024-03-21 DIAGNOSIS — D1391 FAP (familial adenomatous polyposis): Principal | ICD-10-CM

## 2024-03-21 DIAGNOSIS — F411 Generalized anxiety disorder: Principal | ICD-10-CM

## 2024-03-24 ENCOUNTER — Ambulatory Visit: Admit: 2024-03-24 | Discharge: 2024-03-24 | Payer: BLUE CROSS/BLUE SHIELD

## 2024-03-24 ENCOUNTER — Inpatient Hospital Stay: Admit: 2024-03-24 | Discharge: 2024-03-24 | Payer: BLUE CROSS/BLUE SHIELD

## 2024-03-24 ENCOUNTER — Other Ambulatory Visit: Admit: 2024-03-24 | Discharge: 2024-03-24 | Payer: BLUE CROSS/BLUE SHIELD

## 2024-03-24 DIAGNOSIS — D1391 FAP (familial adenomatous polyposis): Principal | ICD-10-CM

## 2024-03-24 DIAGNOSIS — K91850 Pouchitis: Principal | ICD-10-CM

## 2024-03-24 DIAGNOSIS — Z79899 Other long term (current) drug therapy: Principal | ICD-10-CM

## 2024-03-24 DIAGNOSIS — Z9049 Acquired absence of other specified parts of digestive tract: Principal | ICD-10-CM

## 2024-03-24 DIAGNOSIS — D48119 Desmoid tumor: Principal | ICD-10-CM

## 2024-03-24 DIAGNOSIS — D485 Neoplasm of uncertain behavior of skin: Principal | ICD-10-CM

## 2024-03-24 DIAGNOSIS — Z931 Gastrostomy status: Principal | ICD-10-CM

## 2024-03-24 DIAGNOSIS — K56609 Unspecified intestinal obstruction, unspecified as to partial versus complete obstruction: Principal | ICD-10-CM

## 2024-03-24 MED ORDER — ALUMINUM-MAG HYDROXIDE-SIMETHICONE 200 MG-200 MG-20 MG/5 ML ORAL SUSP
ORAL | 0 refills | 0.00000 days | Status: CP
Start: 2024-03-24 — End: ?
  Filled 2024-03-24: qty 355, 11d supply, fill #0

## 2024-03-24 MED ORDER — LIDOCAINE HCL 2 % MUCOSAL SOLUTION
INTRAMUSCULAR | 0 refills | 0.00000 days | Status: CP
Start: 2024-03-24 — End: ?
  Filled 2024-03-24: qty 100, 5d supply, fill #0

## 2024-03-24 MED ORDER — DIPHENHYDRAMINE 12.5 MG/5 ML ORAL LIQUID
ORAL | 0 refills | 0.00000 days | Status: CP
Start: 2024-03-24 — End: ?
  Filled 2024-03-24: qty 118, 5d supply, fill #0

## 2024-03-24 MED ORDER — PROMETHAZINE 12.5 MG TABLET
ORAL_TABLET | Freq: Four times a day (QID) | ORAL | 2 refills | 8.00000 days | Status: CP | PRN
Start: 2024-03-24 — End: ?
  Filled 2024-03-24: qty 30, 8d supply, fill #0

## 2024-03-25 DIAGNOSIS — D48119 Desmoid tumor: Principal | ICD-10-CM

## 2024-04-04 ENCOUNTER — Inpatient Hospital Stay: Admit: 2024-04-04 | Discharge: 2024-04-04 | Payer: BLUE CROSS/BLUE SHIELD

## 2024-04-04 DIAGNOSIS — D48119 Desmoid tumor: Principal | ICD-10-CM

## 2024-04-08 MED ORDER — OXYCODONE 10 MG TABLET
ORAL_TABLET | Freq: Four times a day (QID) | ORAL | 0 refills | 45.00000 days | Status: CP | PRN
Start: 2024-04-08 — End: ?

## 2024-04-10 DIAGNOSIS — D48119 Desmoid tumor: Principal | ICD-10-CM

## 2024-04-13 MED ORDER — LORAZEPAM 1 MG TABLET
ORAL_TABLET | Freq: Two times a day (BID) | ORAL | 0 refills | 15.00000 days | Status: CP | PRN
Start: 2024-04-13 — End: ?
